# Patient Record
Sex: Male | Born: 1965 | Race: Black or African American | Hispanic: No | Marital: Single | Smoking: Former smoker
Health system: Southern US, Community
[De-identification: ages and names within clinical notes are randomized; demographics above are authoritative.]

## PROBLEM LIST (undated history)

## (undated) DIAGNOSIS — N183 Chronic kidney disease, stage 3 unspecified: Secondary | ICD-10-CM

## (undated) DIAGNOSIS — K922 Gastrointestinal hemorrhage, unspecified: Secondary | ICD-10-CM

## (undated) DIAGNOSIS — I639 Cerebral infarction, unspecified: Secondary | ICD-10-CM

## (undated) DIAGNOSIS — R778 Other specified abnormalities of plasma proteins: Secondary | ICD-10-CM

## (undated) DIAGNOSIS — E785 Hyperlipidemia, unspecified: Secondary | ICD-10-CM

## (undated) DIAGNOSIS — I480 Paroxysmal atrial fibrillation: Secondary | ICD-10-CM

## (undated) DIAGNOSIS — R7989 Other specified abnormal findings of blood chemistry: Secondary | ICD-10-CM

## (undated) DIAGNOSIS — K859 Acute pancreatitis without necrosis or infection, unspecified: Secondary | ICD-10-CM

## (undated) DIAGNOSIS — I5042 Chronic combined systolic (congestive) and diastolic (congestive) heart failure: Secondary | ICD-10-CM

## (undated) DIAGNOSIS — I10 Essential (primary) hypertension: Secondary | ICD-10-CM

## (undated) DIAGNOSIS — I4892 Unspecified atrial flutter: Secondary | ICD-10-CM

## (undated) DIAGNOSIS — I426 Alcoholic cardiomyopathy: Secondary | ICD-10-CM

## (undated) DIAGNOSIS — F1010 Alcohol abuse, uncomplicated: Secondary | ICD-10-CM

## (undated) DIAGNOSIS — G4730 Sleep apnea, unspecified: Secondary | ICD-10-CM

## (undated) HISTORY — DX: Essential (primary) hypertension: I10

## (undated) HISTORY — DX: Other specified abnormalities of plasma proteins: R77.8

## (undated) HISTORY — DX: Chronic combined systolic (congestive) and diastolic (congestive) heart failure: I50.42

## (undated) HISTORY — DX: Acute pancreatitis without necrosis or infection, unspecified: K85.90

## (undated) HISTORY — DX: Other specified abnormal findings of blood chemistry: R79.89

## (undated) HISTORY — DX: Alcoholic cardiomyopathy: I42.6

## (undated) HISTORY — DX: Alcohol abuse, uncomplicated: F10.10

## (undated) HISTORY — DX: Hyperlipidemia, unspecified: E78.5

## (undated) HISTORY — DX: Paroxysmal atrial fibrillation: I48.0

## (undated) HISTORY — PX: KNEE SURGERY: SHX244

## (undated) HISTORY — DX: Unspecified atrial flutter: I48.92

## (undated) HISTORY — DX: Sleep apnea, unspecified: G47.30

## (undated) HISTORY — DX: Gastrointestinal hemorrhage, unspecified: K92.2

## (undated) SURGERY — TRANSESOPHAGEAL ECHOCARDIOGRAM (TEE)
Anesthesia: General

---

## 2004-10-11 ENCOUNTER — Emergency Department: Payer: Self-pay | Admitting: Internal Medicine

## 2005-12-09 ENCOUNTER — Emergency Department: Payer: Self-pay | Admitting: Emergency Medicine
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
## 2005-12-22 ENCOUNTER — Other Ambulatory Visit: Payer: Self-pay

## 2007-02-24 ENCOUNTER — Other Ambulatory Visit: Payer: Self-pay

## 2007-02-24 ENCOUNTER — Observation Stay: Payer: Self-pay | Admitting: Internal Medicine

## 2007-02-24 DIAGNOSIS — I426 Alcoholic cardiomyopathy: Secondary | ICD-10-CM

## 2007-02-24 HISTORY — DX: Alcoholic cardiomyopathy: I42.6

## 2007-02-24 IMAGING — CR DG CHEST 1V PORT
1 series · 1 of 1 positions shown · non-contrast
Comparison: none

REASON FOR EXAM: CP
COMMENTS:

[view not recorded]
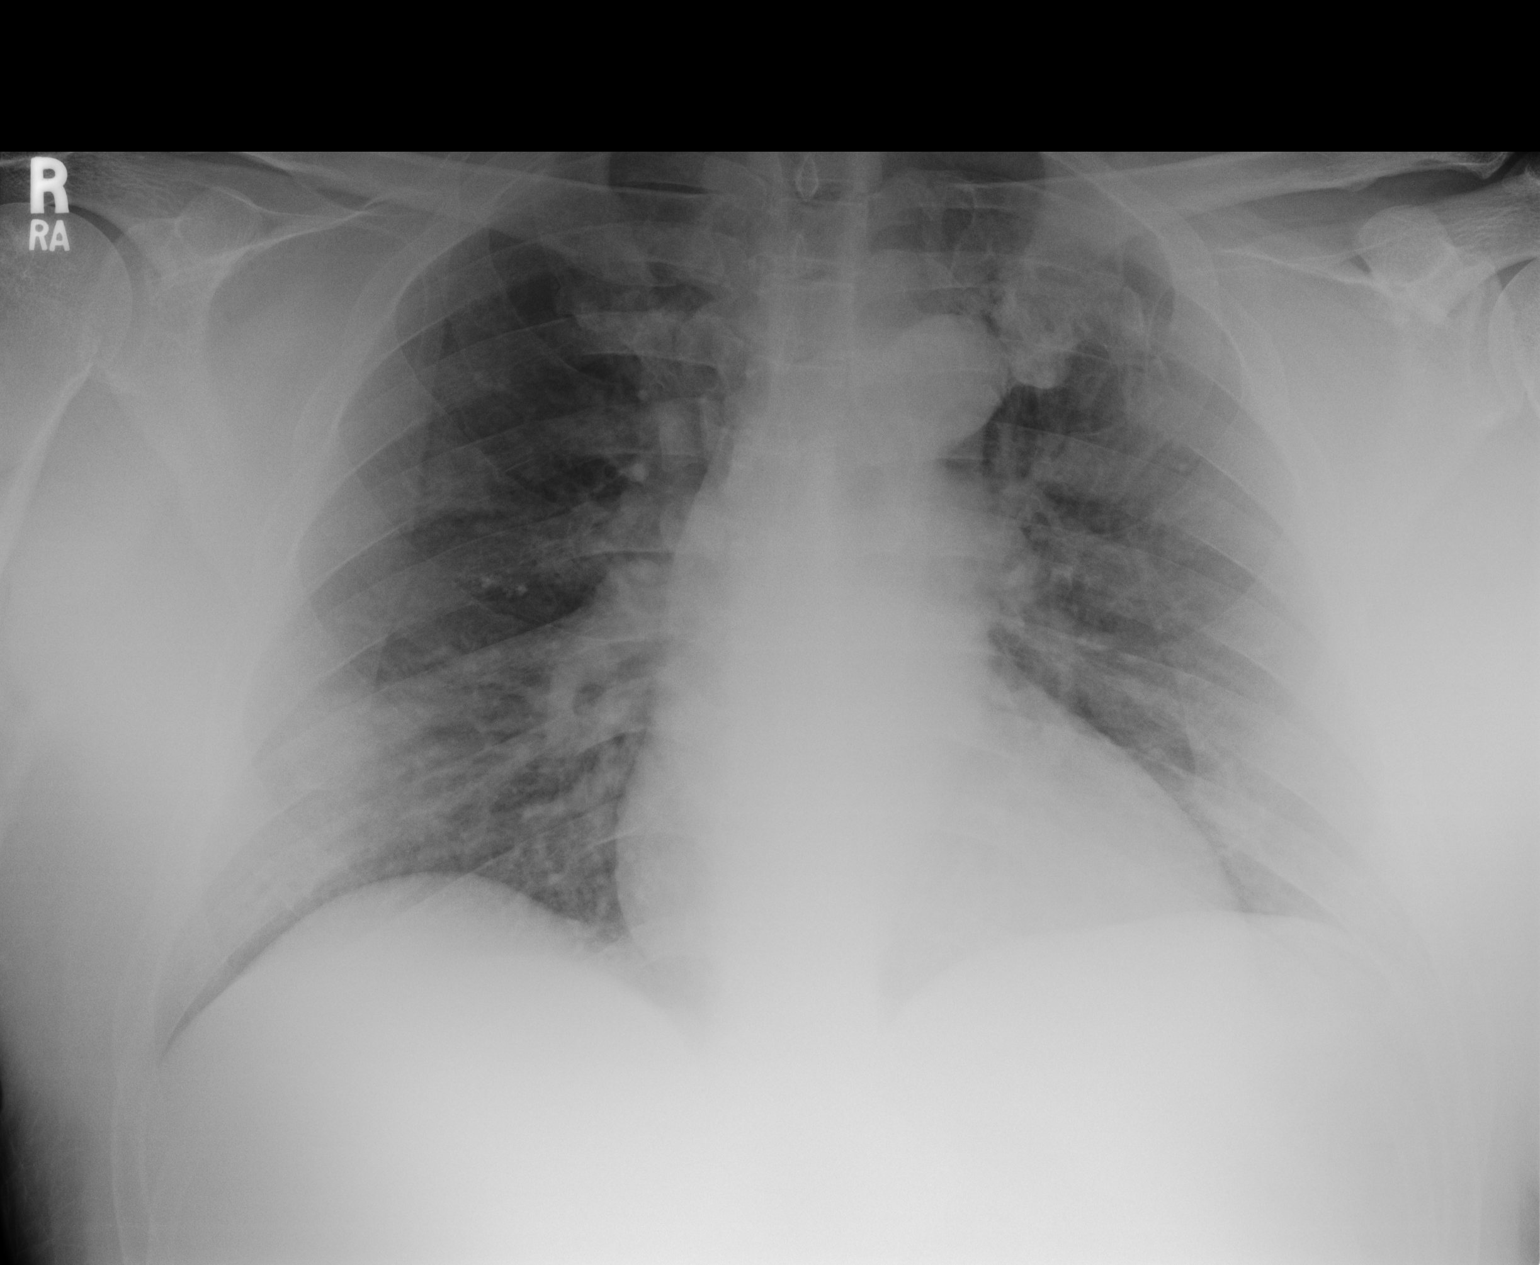

[1 of 1 positions shown; findings below may reference images not displayed]

PROCEDURE:     DXR - DXR PORTABLE CHEST SINGLE VIEW  - [DATE]  [DATE]

RESULT:     There is mild thickening of the interstitial markings. An
ill-defined area of increased density projects within the right lower lobe.
 A second area of nodular increased density projects in the region of the
left upper lobe. Cardiac silhouette is moderately enlarged. The patient has
taken a shallow inspiration.  There is mild thickening of interstitial
markings.  Visualized bony skeleton demonstrates no evidence of of fracture
or dislocation.
IMPRESSION: Atelectasis versus infiltrate in the right lung base.
Atelectasis versus infiltrate versus possibly a nodular density within the
left upper lobe. Repeat surveillance evaluation is recommended as well as
repeat evaluation with PA and lateral view of the chest.

## 2007-02-25 IMAGING — US US RENAL KIDNEY
2 series · 25 of 25 positions shown · non-contrast
Comparison: none

REASON FOR EXAM: r/o hydronephrosis
COMMENTS:

[Series 1: us renal kidney · 13 of 24 slices shown (1 of 2)]
[im 1/24]
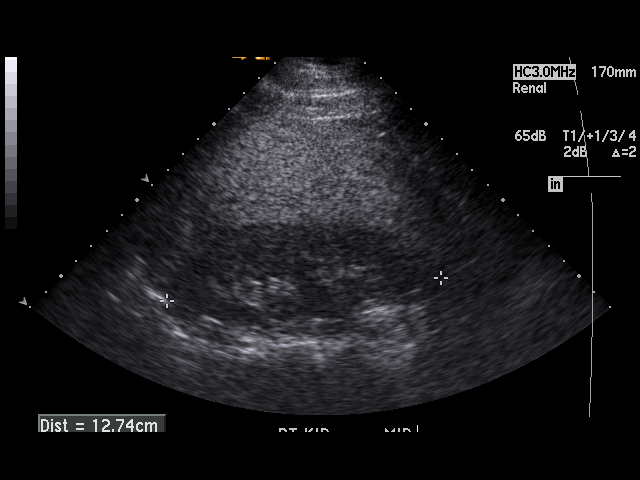
[im 2/24]
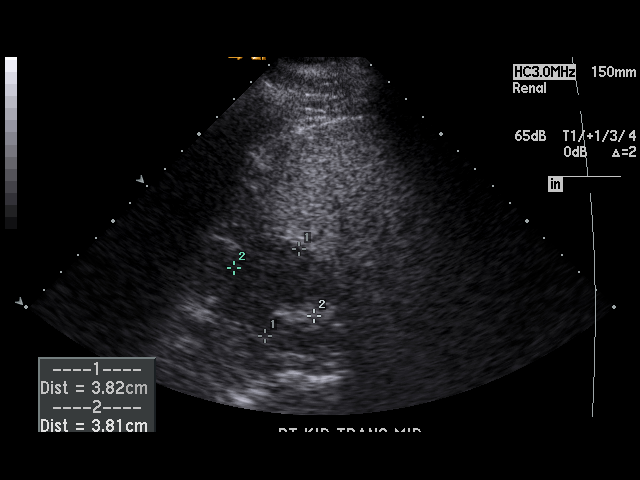
[im 4/24]
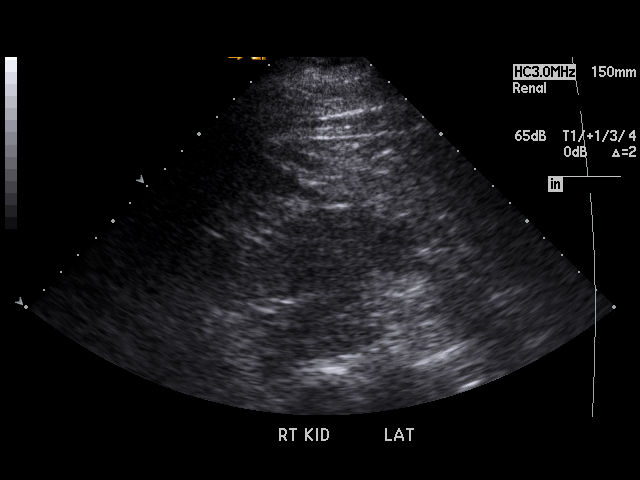
[im 6/24]
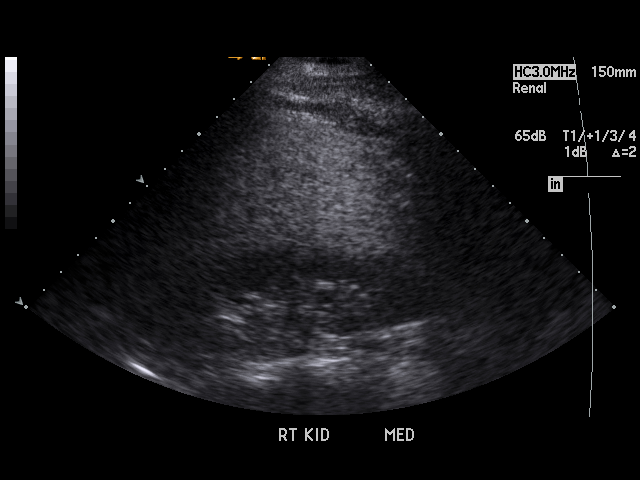
[im 8/24]
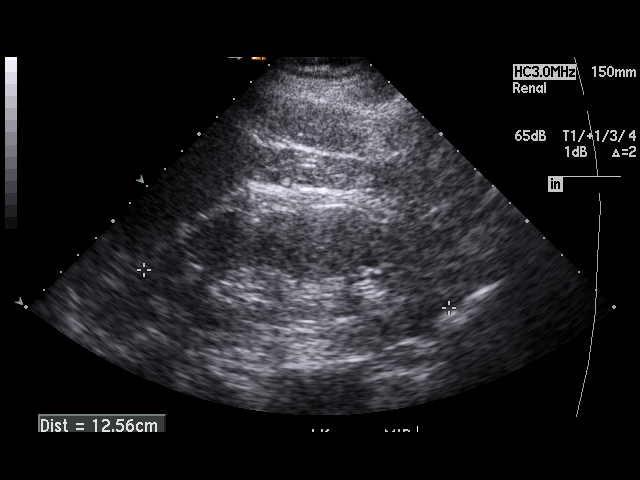
[im 10/24]
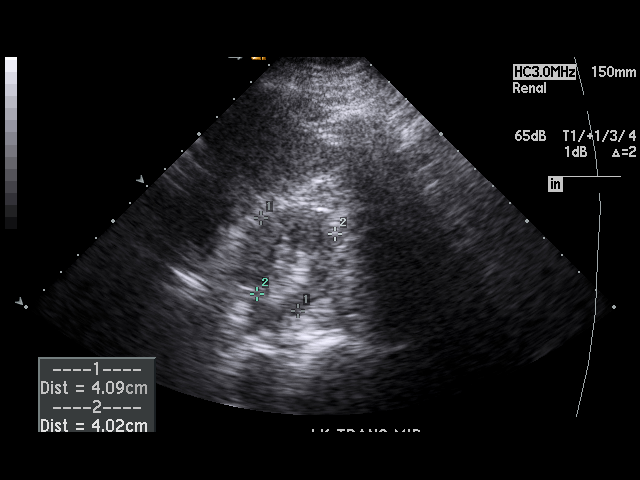
[im 12/24]
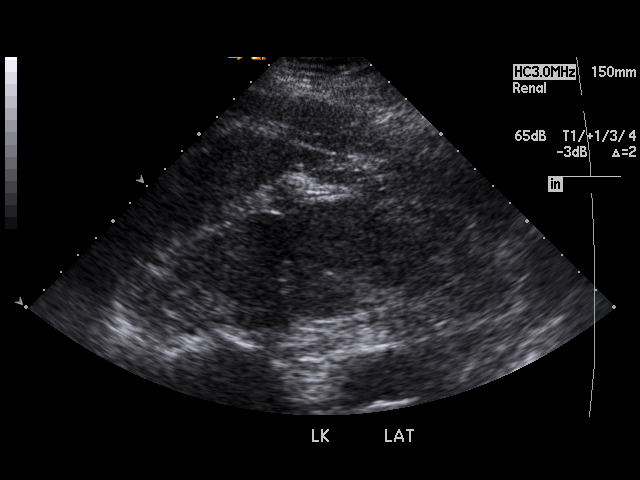
[im 14/24]
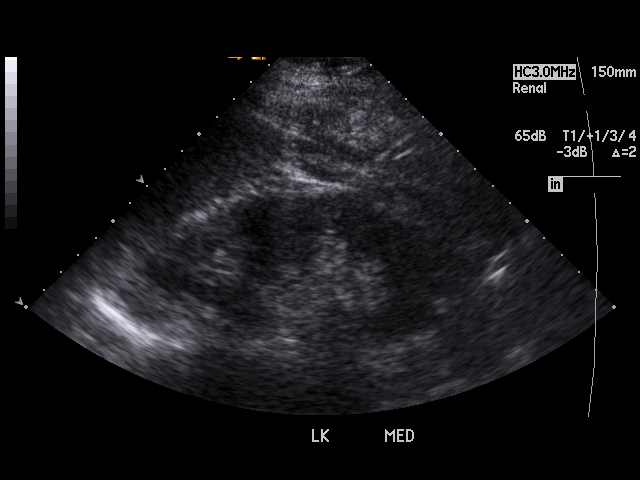
[im 16/24]
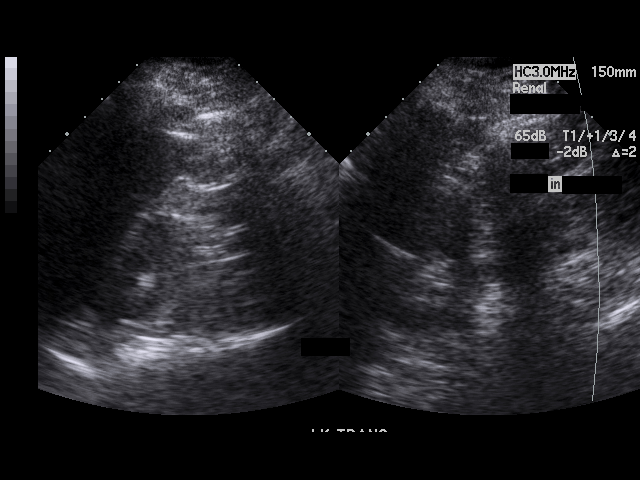
[im 18/24]
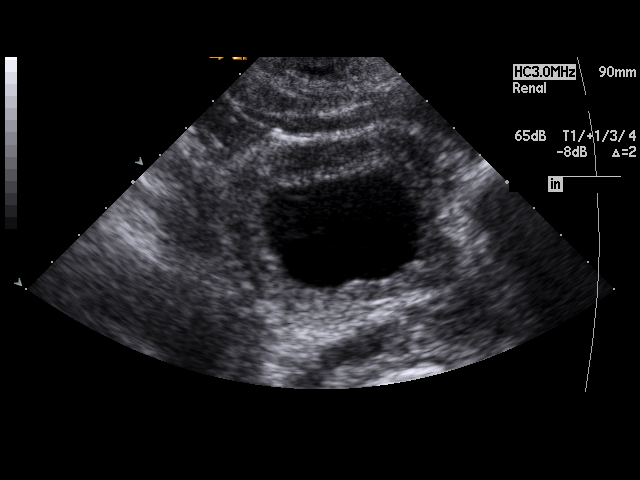
[im 20/24]
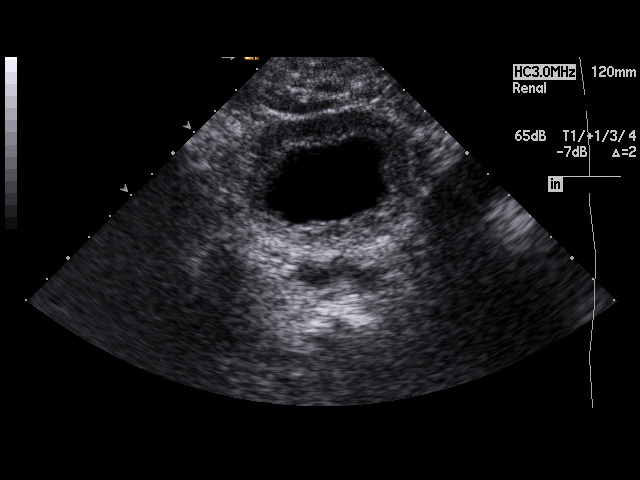
[im 22/24]
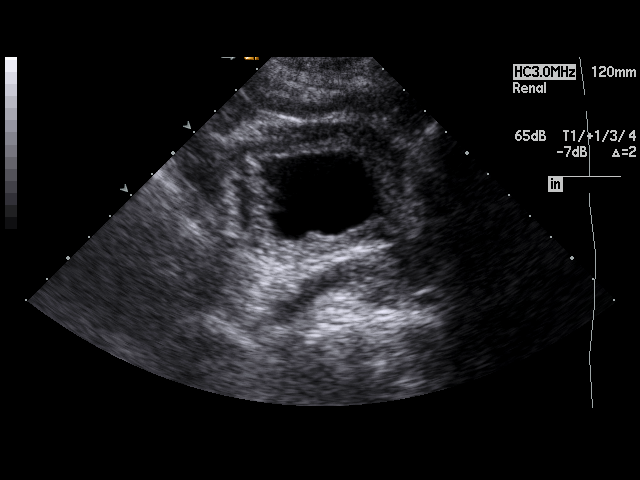
[im 24/24]
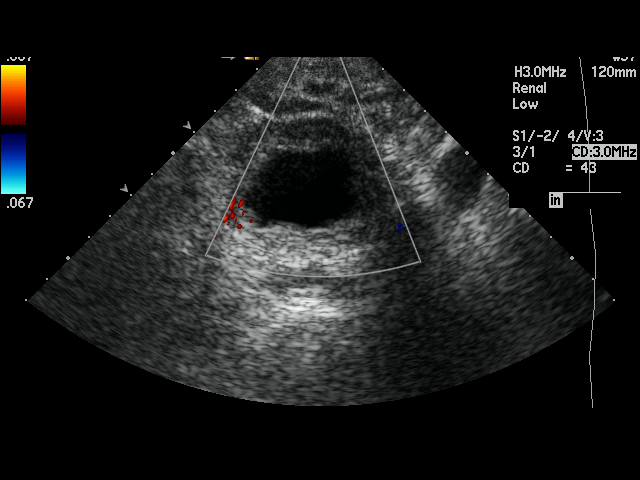

[Series 2: us renal kidney · 12 of 24 slices shown (2 of 2)]
[im 1/24]
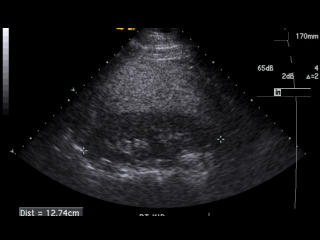
[im 3/24]
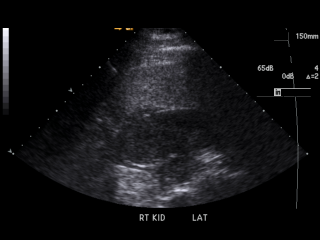
[im 5/24]
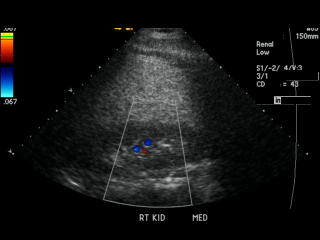
[im 7/24]
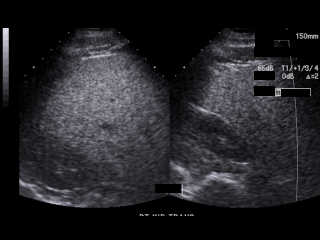
[im 9/24]
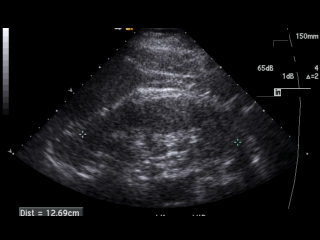
[im 11/24]
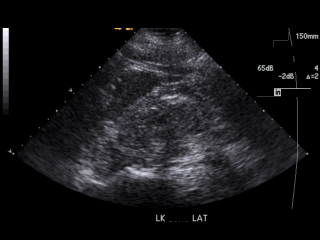
[im 13/24]
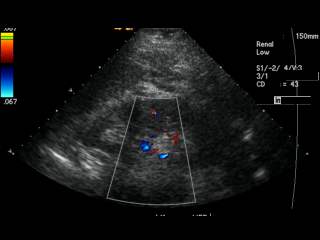
[im 15/24]
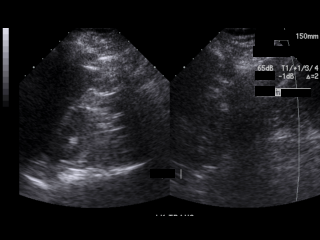
[im 17/24]
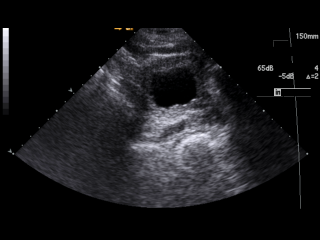
[im 19/24]
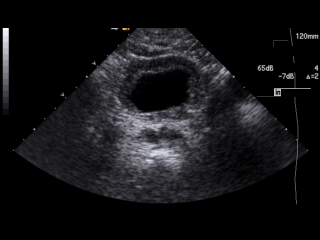
[im 21/24]
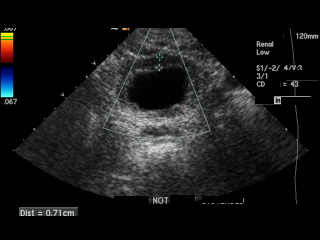
[im 24/24]
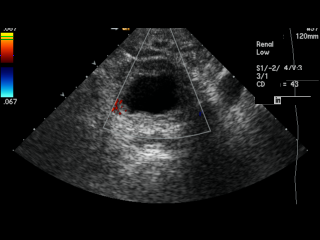

[25 of 25 positions shown; findings below may reference images not displayed]

PROCEDURE:     US  - US KIDNEY BILATERAL  - [DATE] [DATE]

RESULT:     The RIGHT kidney measures 12.74 cm x 3.82 cm x 3.81 cm and the
LEFT kidney measures 12.69 cm x 4.09 cm x 4.02 cm. The renal cortical
margins are smooth. No new renal mass lesions are seen. No renal
calcifications are noted. There is no hydronephrosis. The urinary bladder is
not fully distended. The bladder wall appears thickened but this could be
due to the lack of filling. The possibility, however, of bladder wall
thickening cannot be totally excluded on this exam.
IMPRESSION: 1. No hydronephrosis or other acute renal abnormalities identified.
2. Possible thickening of the gallbladder wall. As noted above, this
appearance may be secondary to the bladder being only partially filled.

## 2007-12-24 ENCOUNTER — Emergency Department: Payer: Self-pay | Admitting: Emergency Medicine

## 2007-12-24 IMAGING — CR DG CHEST 2V
1 series · 2 of 2 positions shown · non-contrast
Comparison: none

REASON FOR EXAM: Chest pain
COMMENTS:

PROCEDURE:     DXR - DXR CHEST PA (OR AP) AND LATERAL  - [DATE]  [DATE]
RESULT:     The mediastinal and hilar structures are normal. The lungs are
clear of infiltrates. There is cardiomegaly without evidence of overt
congestive heart failure.

[Series 1: view not recorded · 0.17mm/px · 2 of 2 slices shown]
[im 1/2]
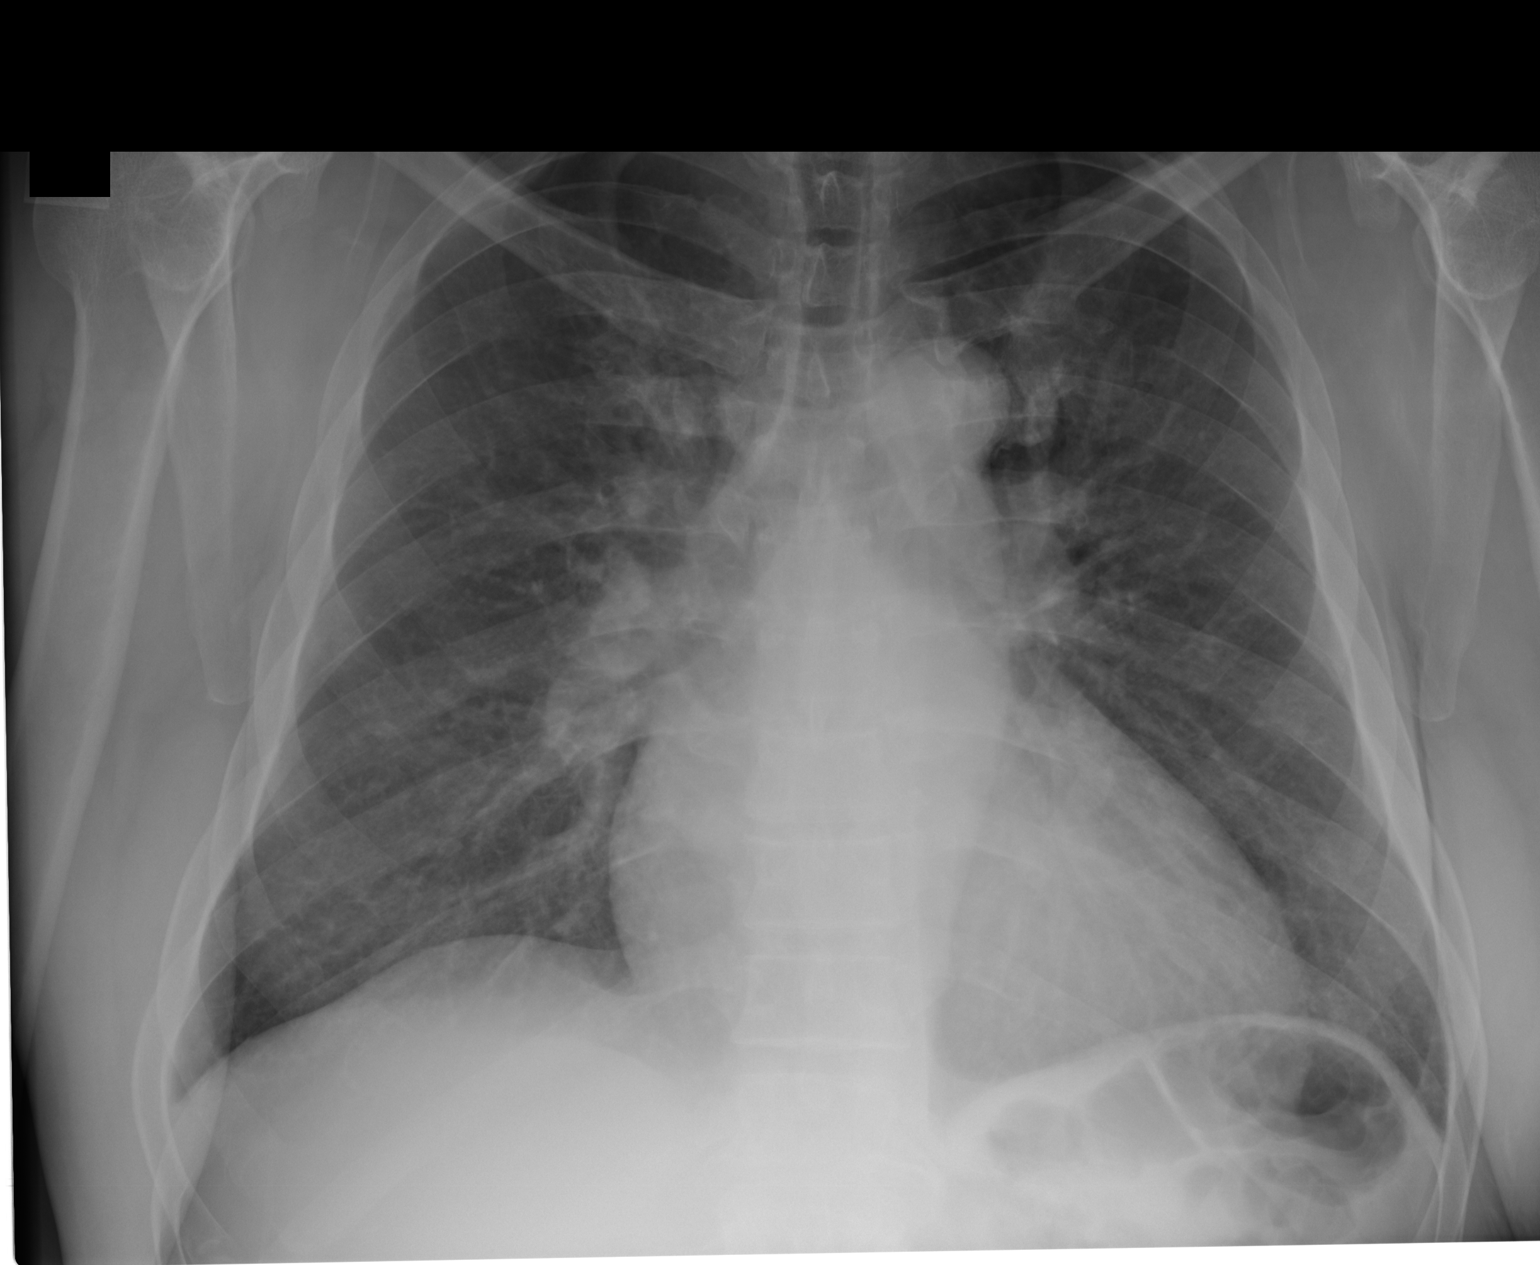
[im 2/2]
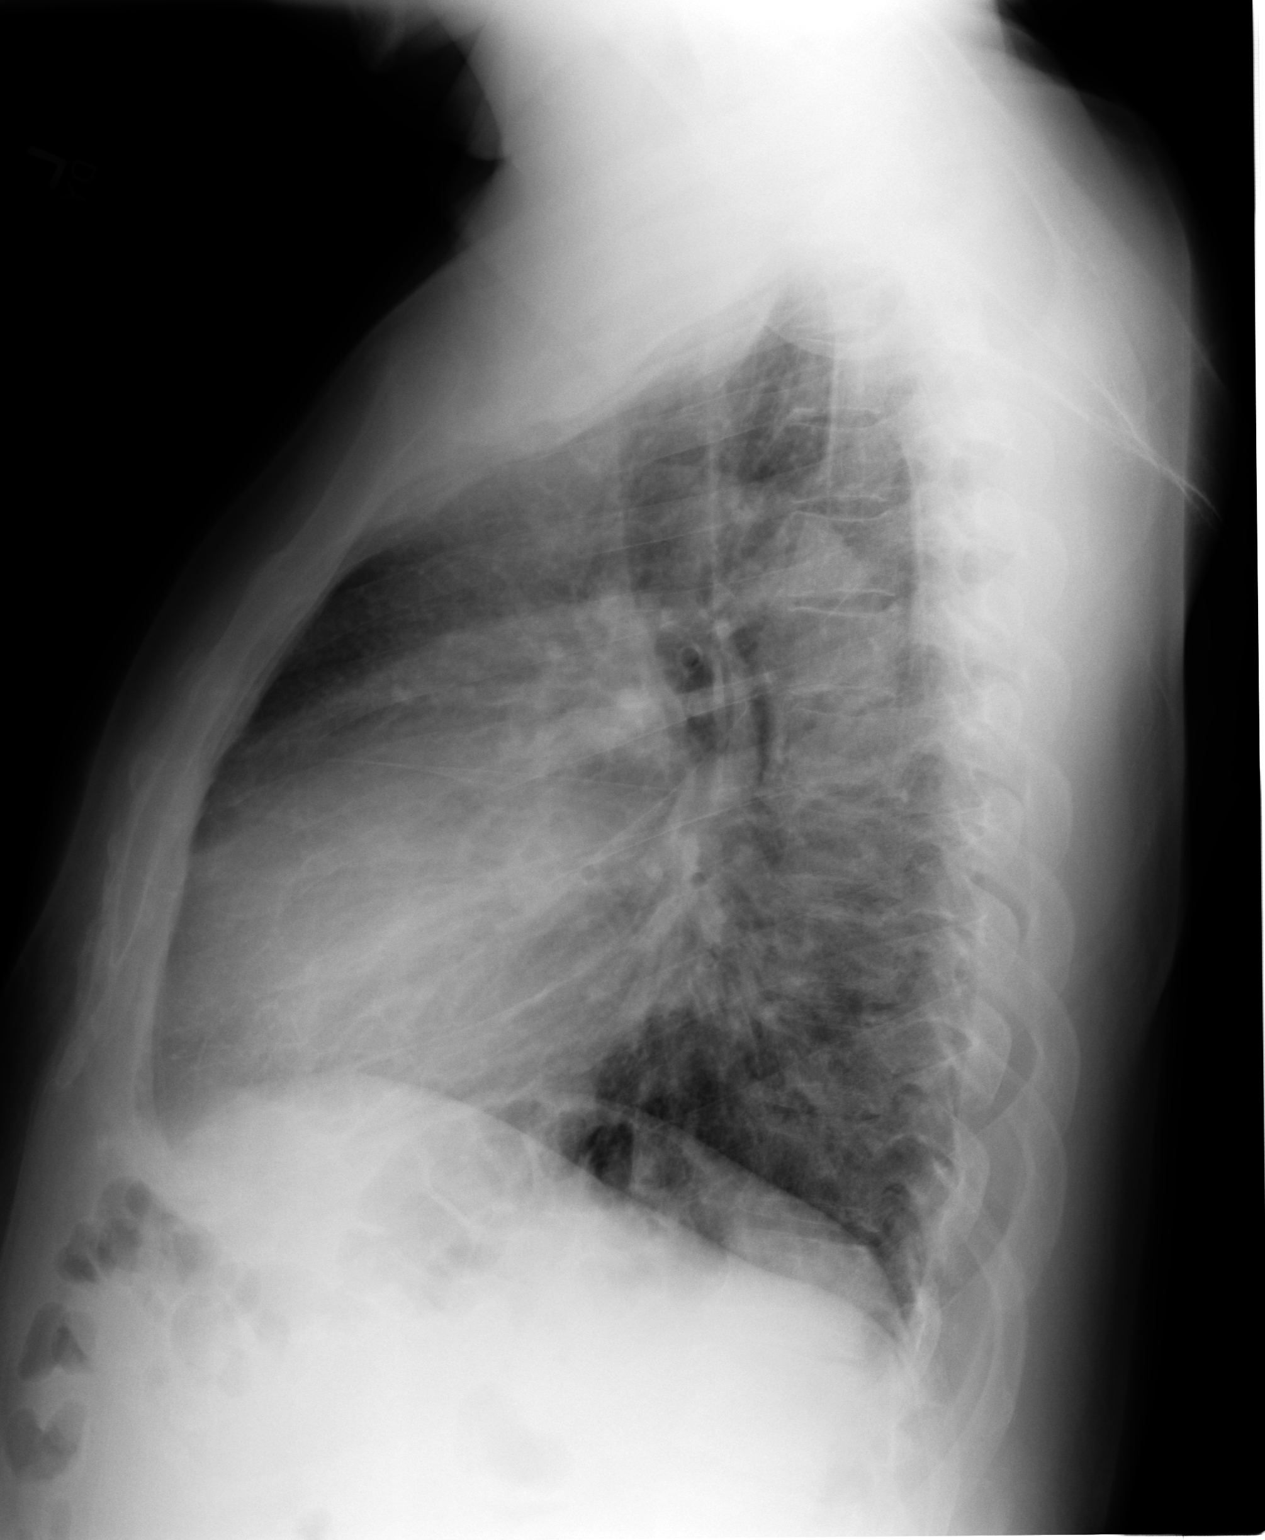

[2 of 2 positions shown; findings below may reference images not displayed]

IMPRESSION: Cardiomegaly. No congestive heart failure.

## 2008-01-09 ENCOUNTER — Inpatient Hospital Stay: Payer: Self-pay | Admitting: Internal Medicine

## 2008-01-09 IMAGING — US ABDOMEN ULTRASOUND
1 series · 17 of 25 positions shown · non-contrast
Comparison: none

REASON FOR EXAM: jaundice, leg swelling
COMMENTS:

[Series 1: abdomen ultrasound · 17 of 73 slices shown]
[im 1/73]
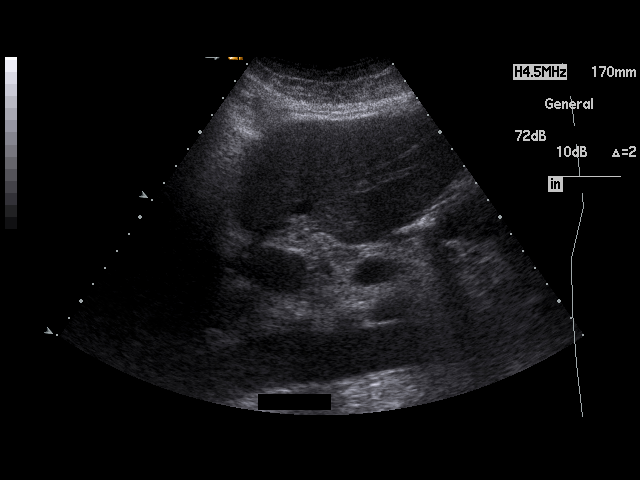
[im 7/73]
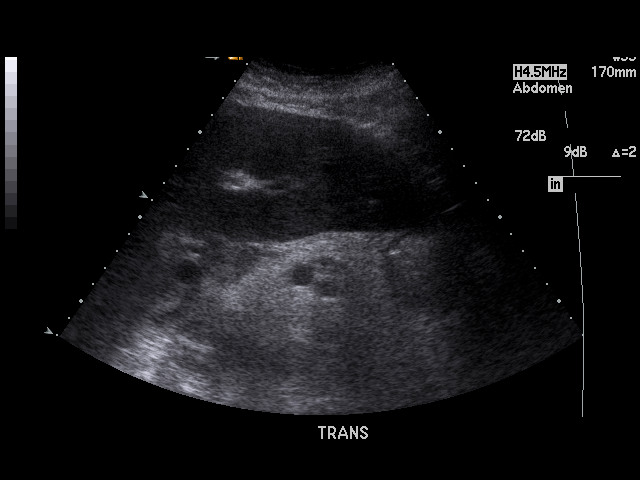
[im 10/73]
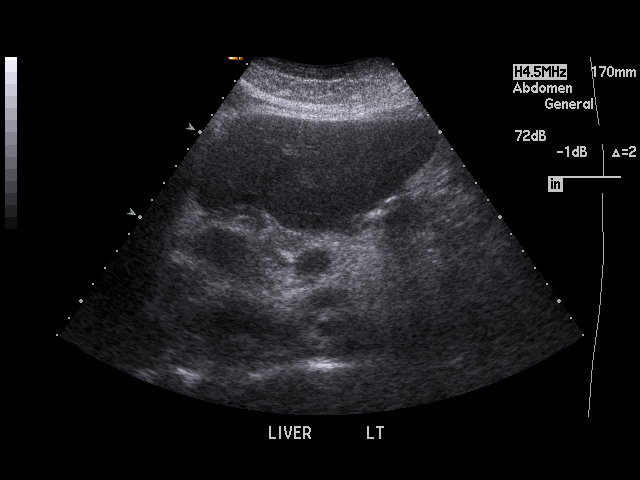
[im 16/73]
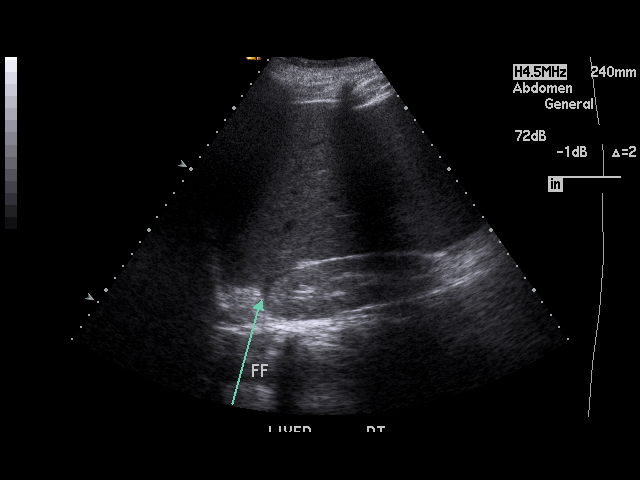
[im 19/73]
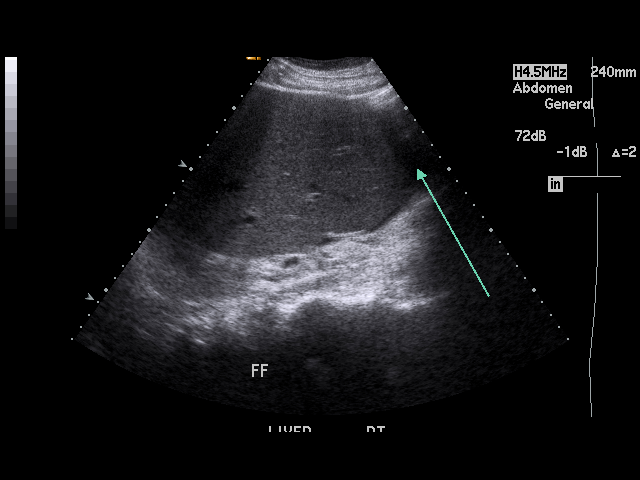
[im 25/73]
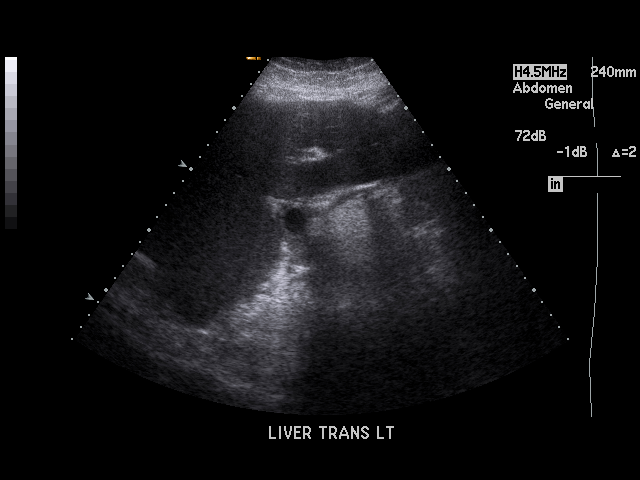
[im 28/73]
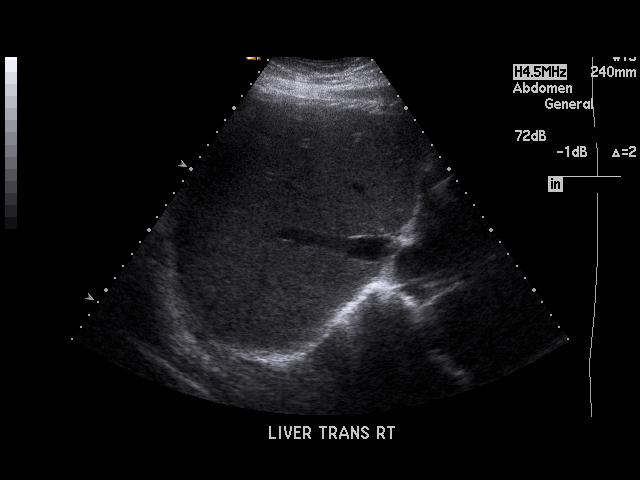
[im 34/73]
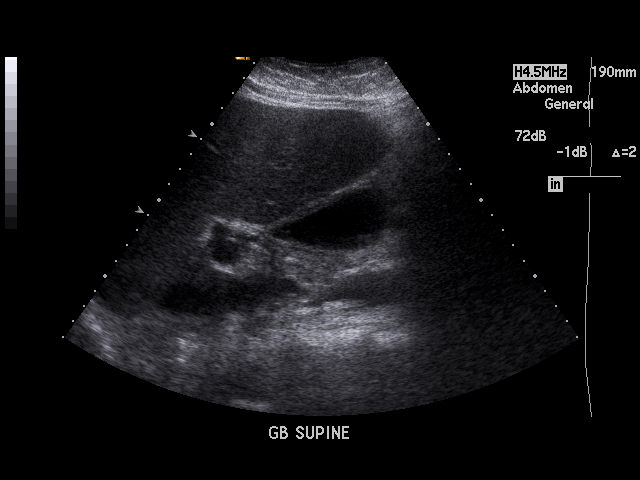
[im 37/73]
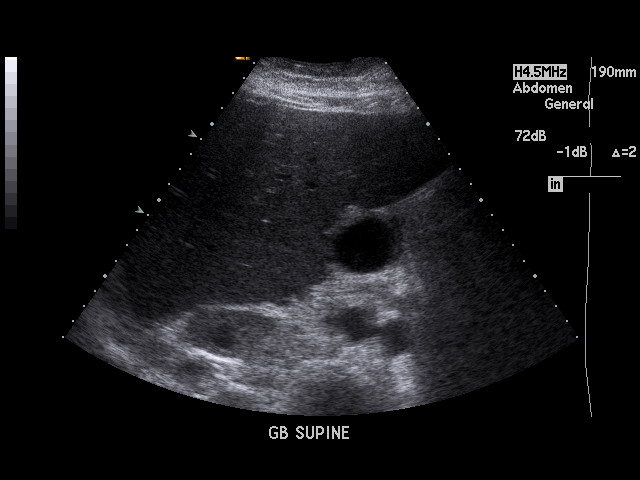
[im 40/73]
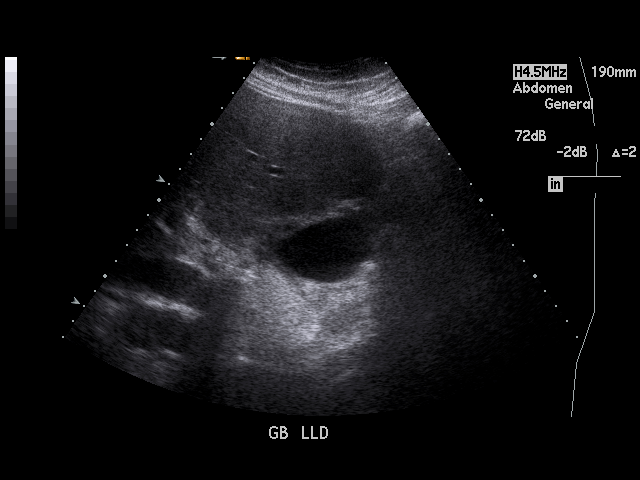
[im 46/73]
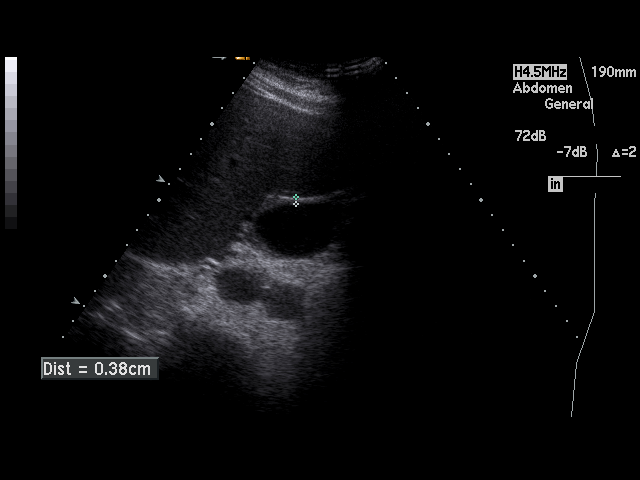
[im 49/73]
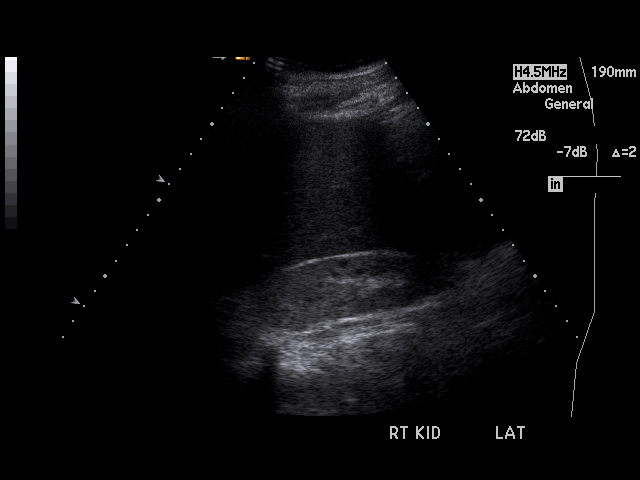
[im 55/73]
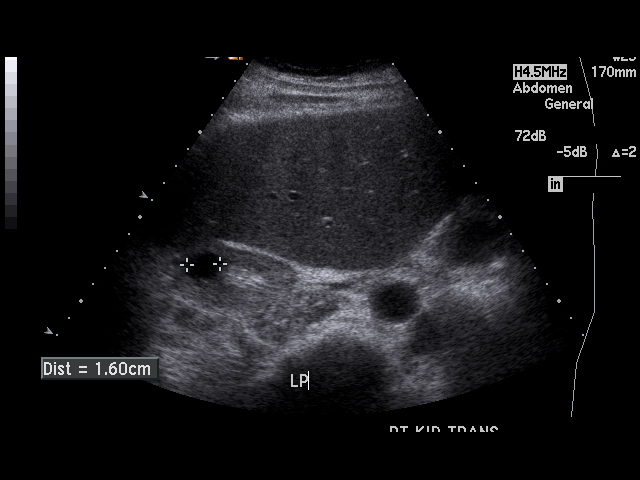
[im 58/73]
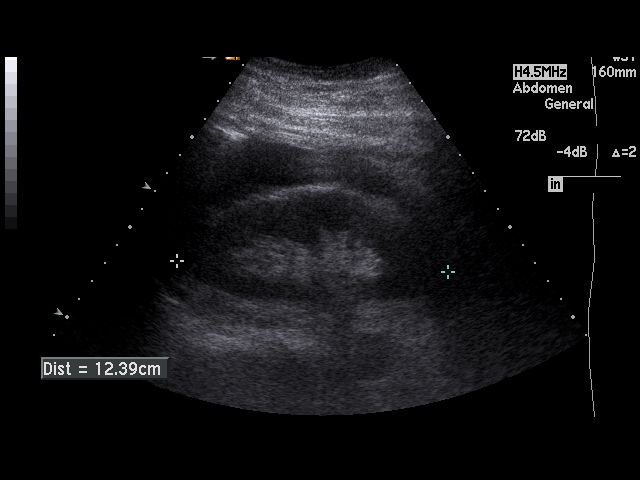
[im 64/73]
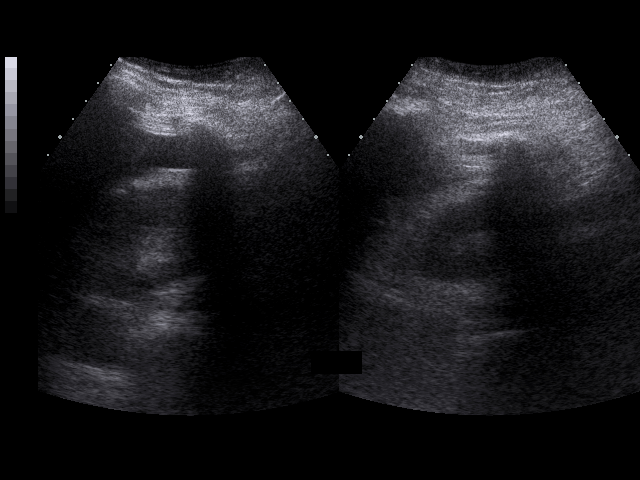
[im 67/73]
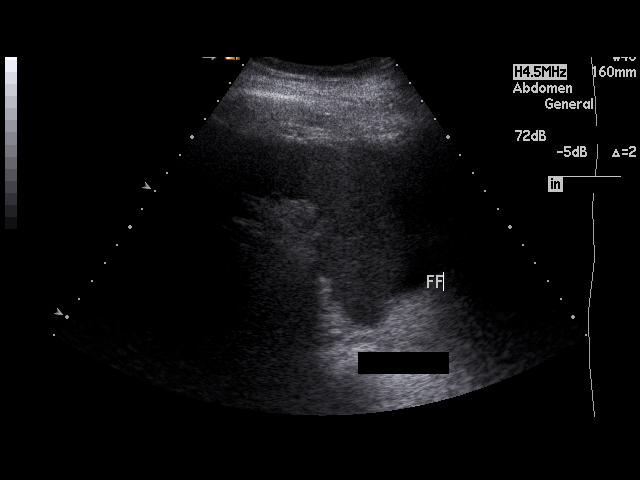
[im 73/73]
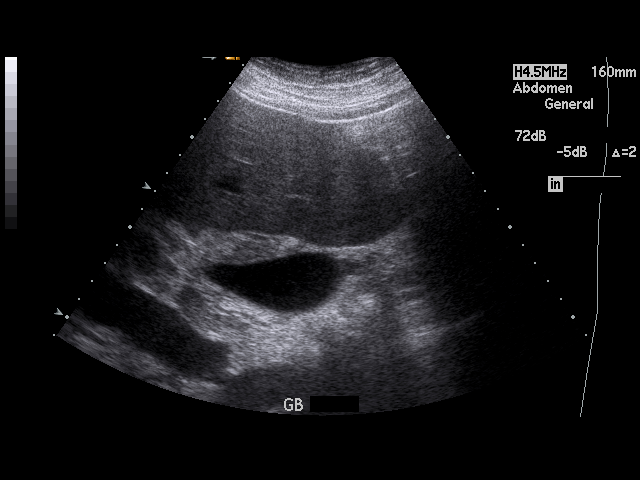

[17 of 25 positions shown; findings below may reference images not displayed]

PROCEDURE:     US  - US ABDOMEN GENERAL SURVEY  - [DATE]  [DATE]

RESULT:     The liver is enlarged and slightly echogenic.  Hepatocellular
disease cannot be excluded. There is trace ascites. There is mild
gallbladder wall thickening at 3.8 mm.  Sludge is noted within the
gallbladder. There are no stones. There is no hydronephrosis. Increased
echogenicity is noted in the kidneys. Renal disease could present in this
fashion. The RIGHT kidney measures 12.1 cm and the LEFT 12.4.  Cysts are
noted in the RIGHT kidney.  The spleen measures 9.7 cm.
IMPRESSION: 1. Mild enlargement and increased echogenicity of the liver suggesting
hepatocellular disease. There is mild ascites. There is mild gallbladder
wall thickening.

2. Sludge, no gallstones, no gallbladder distention.

3. Echogenic kidneys suggesting chronic renal disease.

## 2008-01-09 IMAGING — CR DG CHEST 2V
1 series · 2 of 2 positions shown · non-contrast
Comparison: none

REASON FOR EXAM: Dyspnea/shortness of breath
COMMENTS:

[Series 1: view not recorded · 0.17mm/px · 2 of 2 slices shown]
[im 1/2]
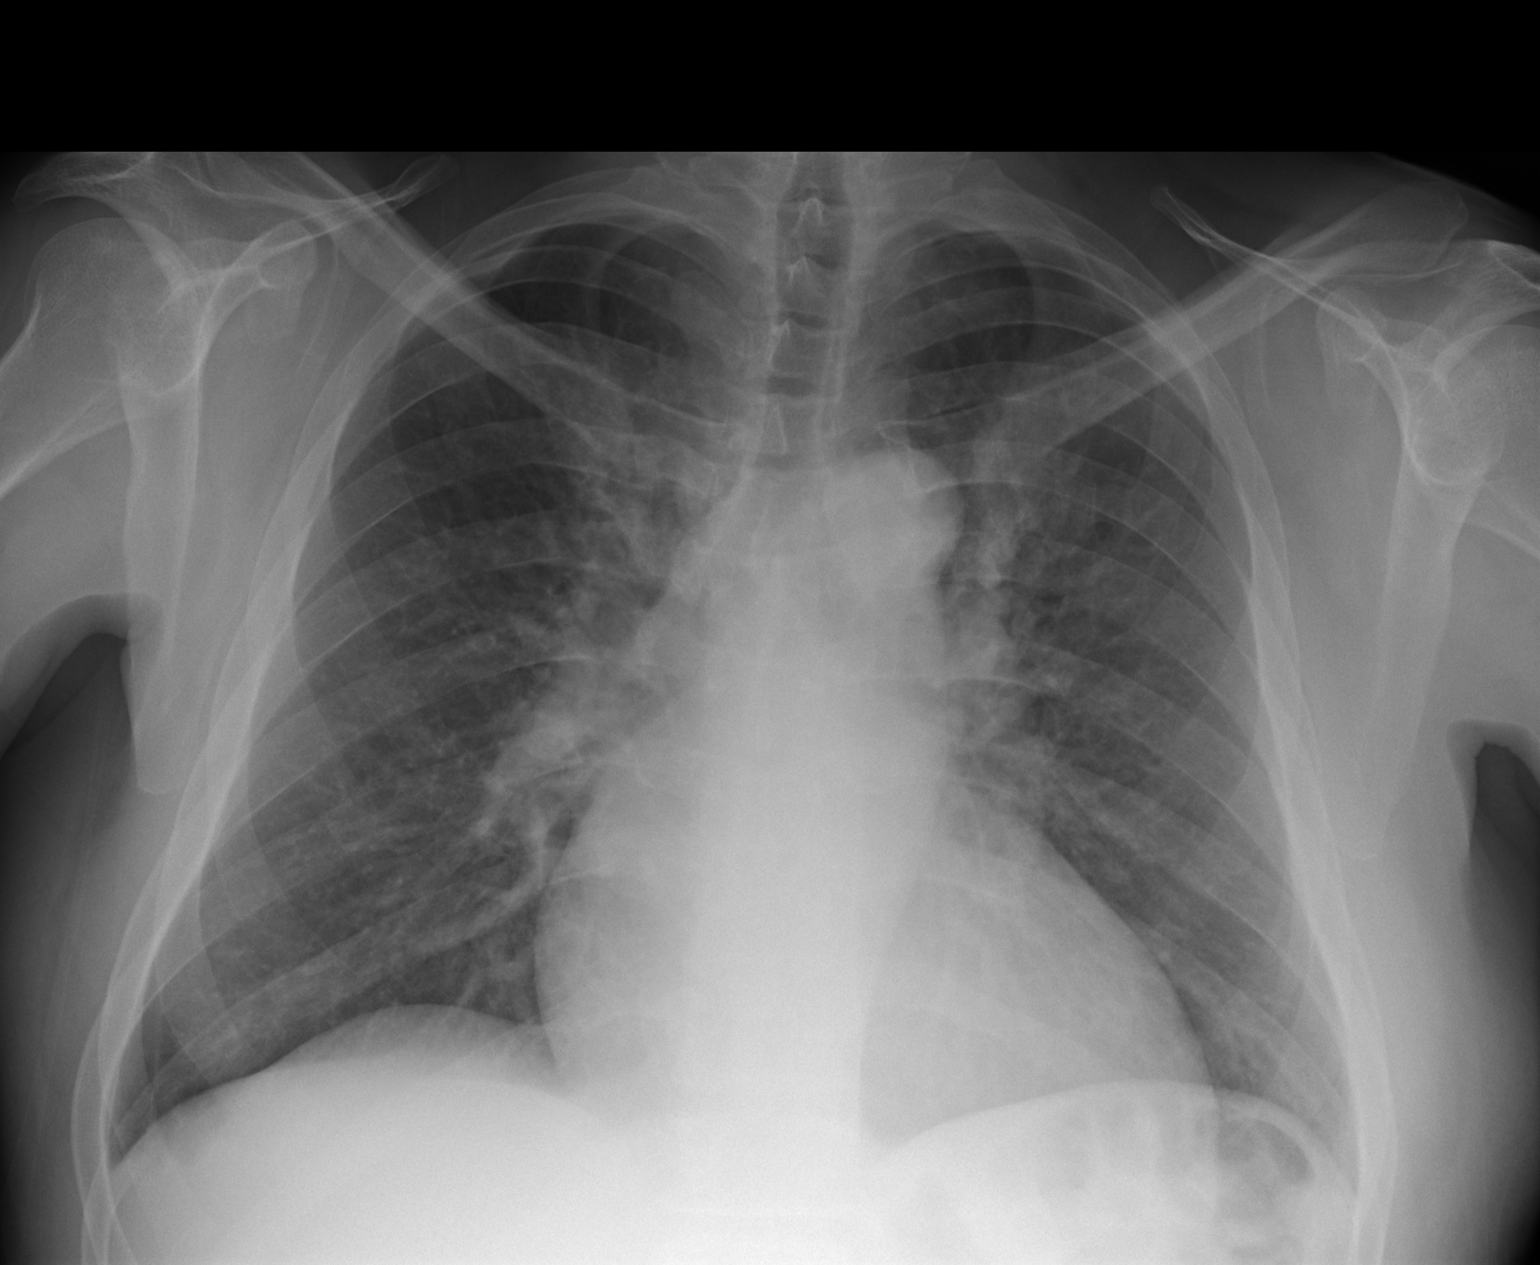
[im 2/2]
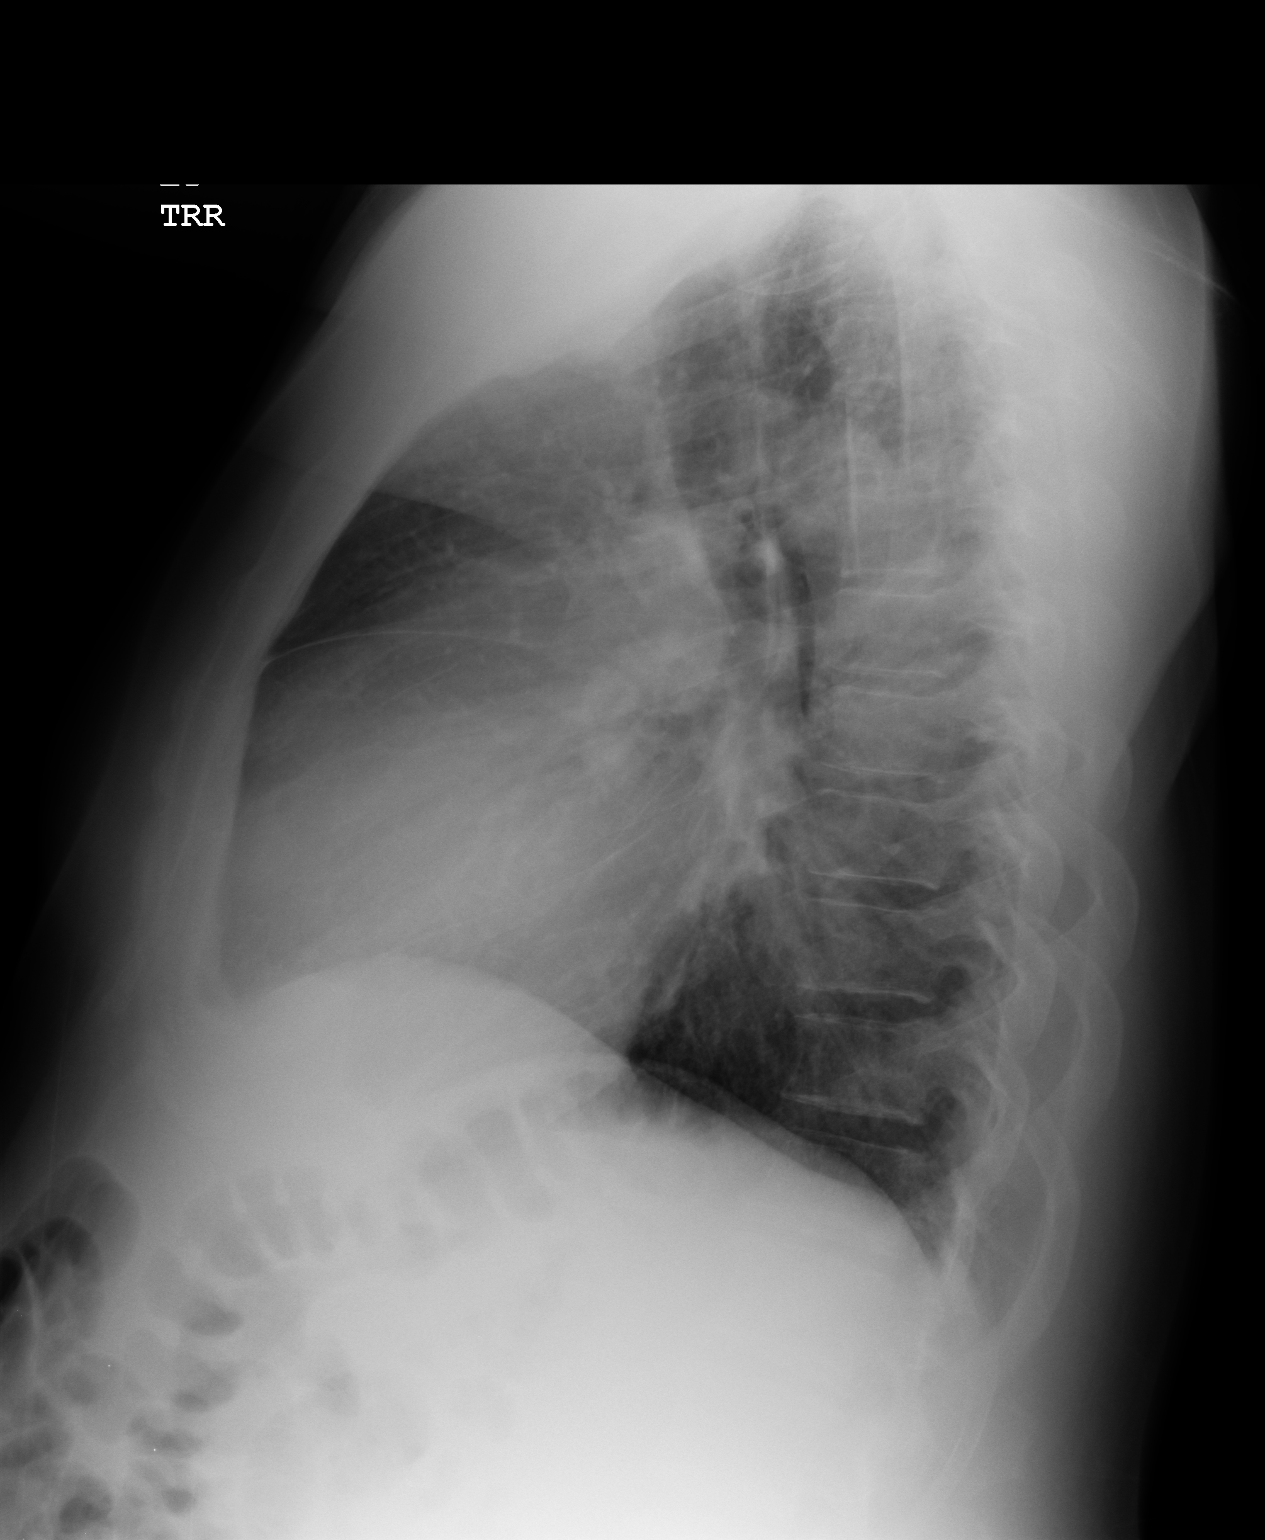

[2 of 2 positions shown; findings below may reference images not displayed]

PROCEDURE:     DXR - DXR CHEST PA (OR AP) AND LATERAL  - [DATE]  [DATE]

RESULT:     Comparison is made to prior exams. Again noted is cardiomegaly
with pulmonary vascular prominence. To exclude developing congestive heart
failure follow-up chest x-ray is suggested. Increased markings are noted in
the LEFT upper lobe. These are most likely related to overlapping shadows
including bony structures and vascular structures. Again, follow-up chest
x-ray may prove useful.
IMPRESSION: 1. Cardiomegaly with pulmonary vascular congestion. Similar findings are
noted on prior exams and follow-up chest x-rays are suggested to exclude
developing congestive heart failure.

## 2008-01-10 IMAGING — US US EXTREM LOW VENOUS BILAT
1 series · 17 of 24 positions shown · non-contrast
Comparison: none

REASON FOR EXAM: Swelling, pain
COMMENTS:

[Series 1: us extrem low venous bilat · 17 of 41 slices shown]
[im 1/41]
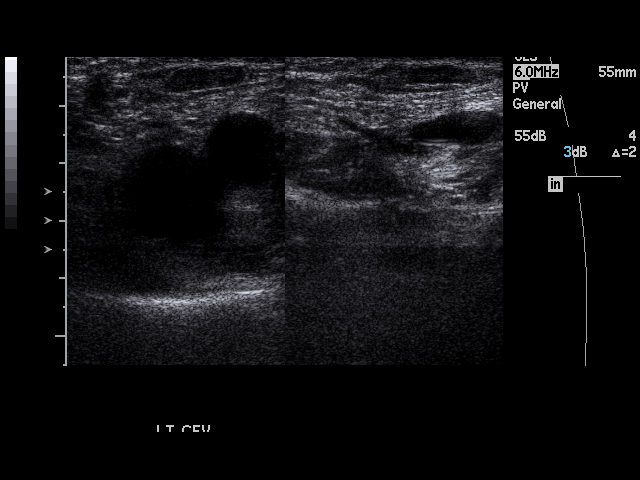
[im 4/41]
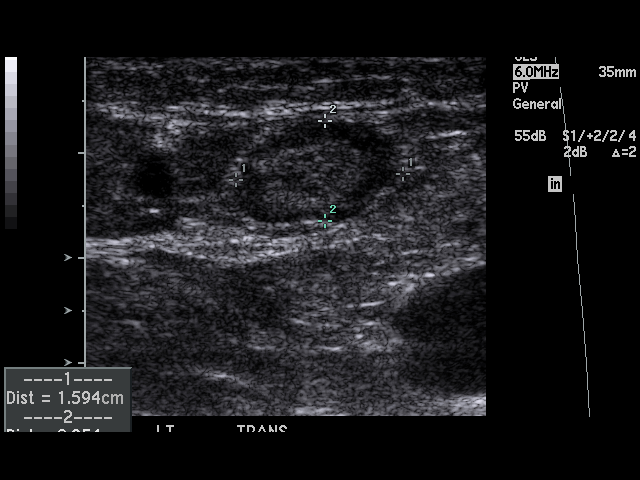
[im 6/41]
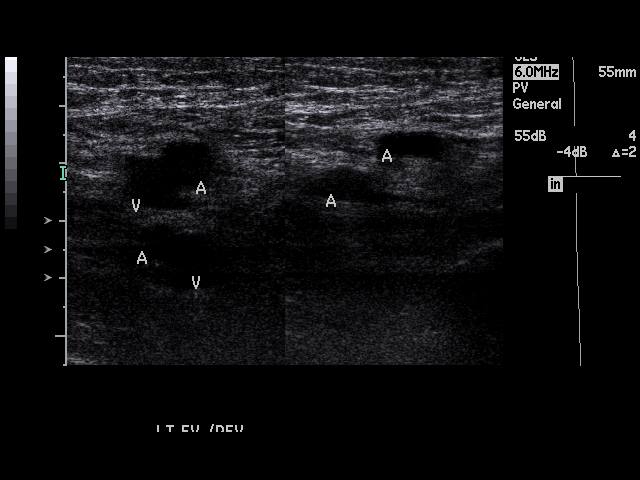
[im 7/41]
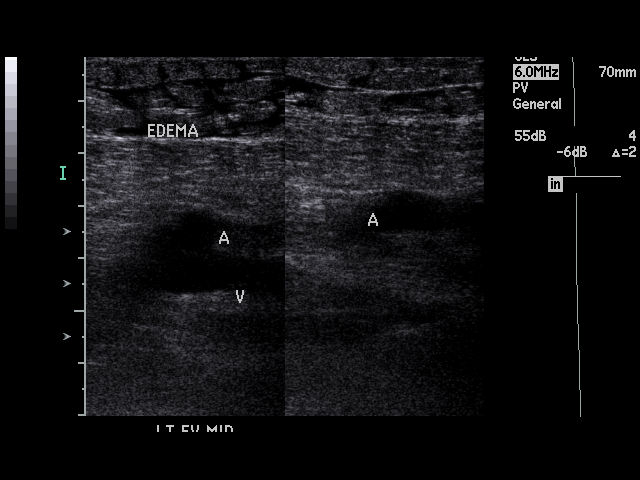
[im 11/41]
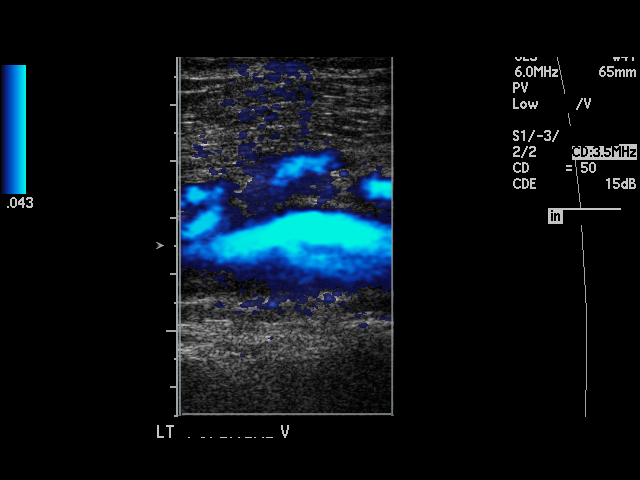
[im 13/41]
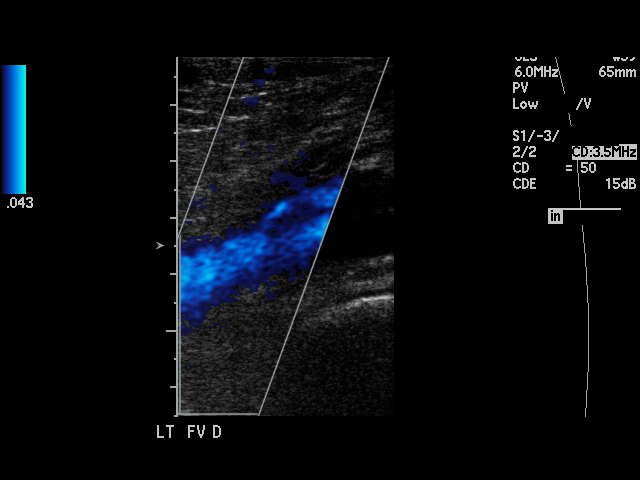
[im 16/41]
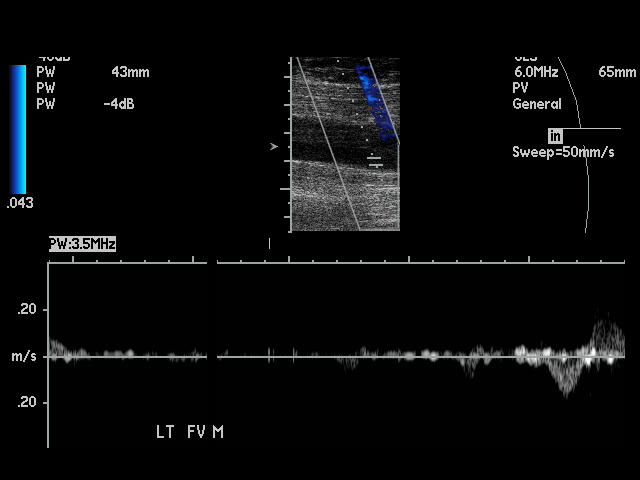
[im 18/41]
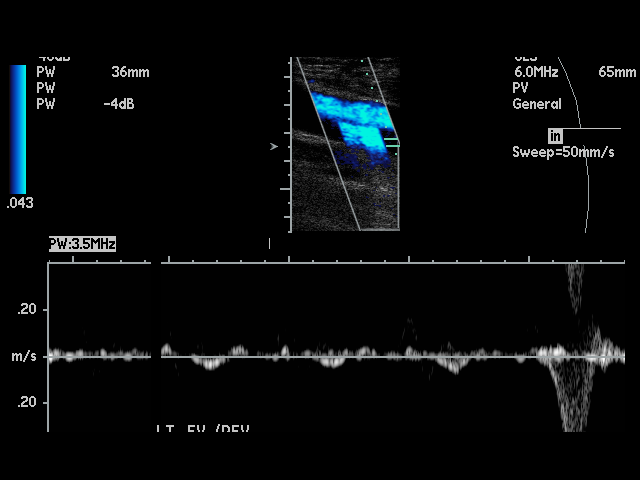
[im 21/41]
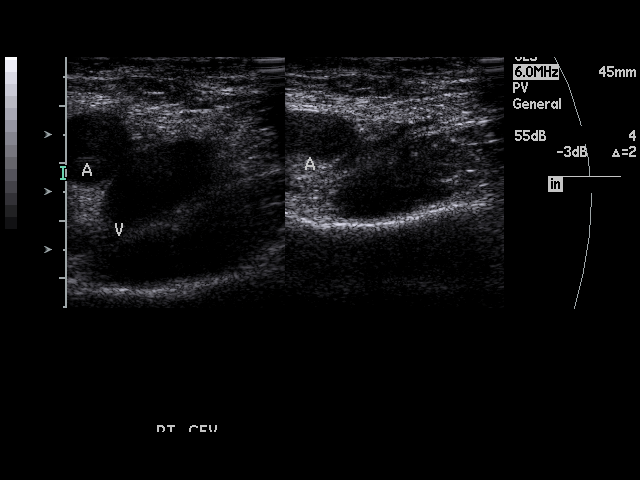
[im 23/41]
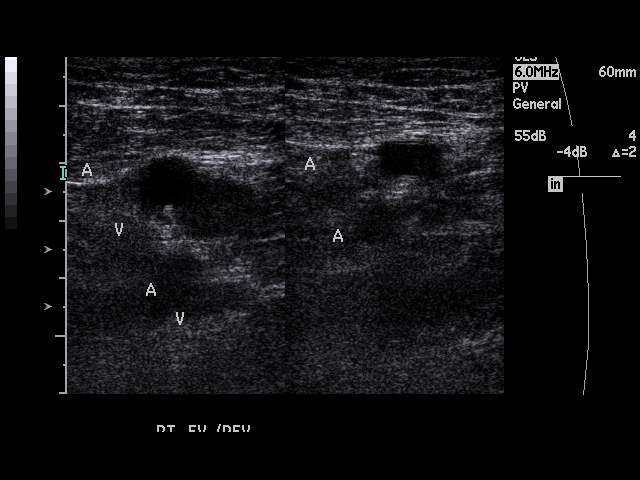
[im 25/41]
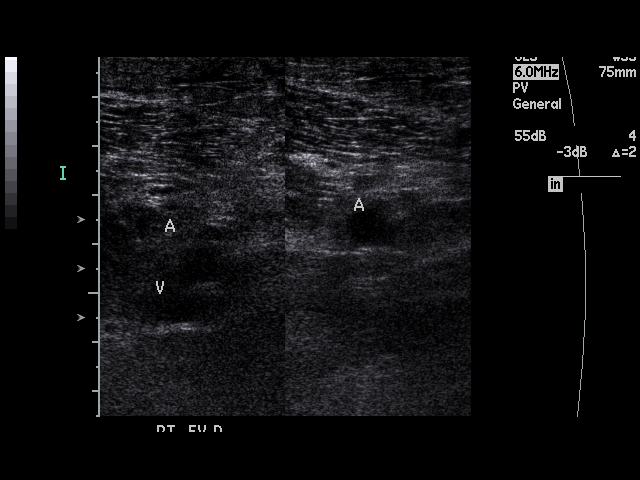
[im 28/41]
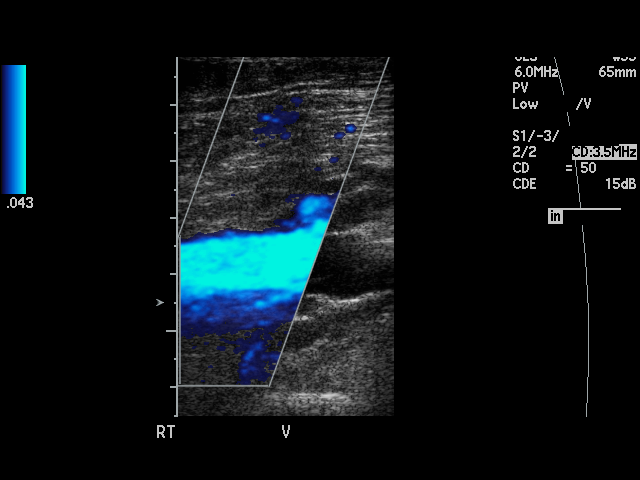
[im 30/41]
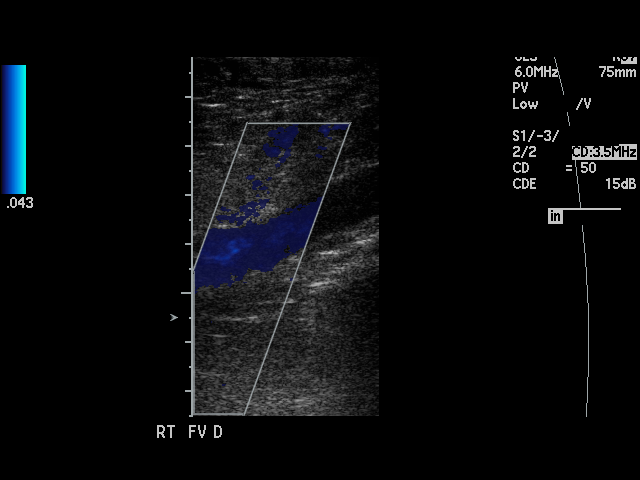
[im 34/41]
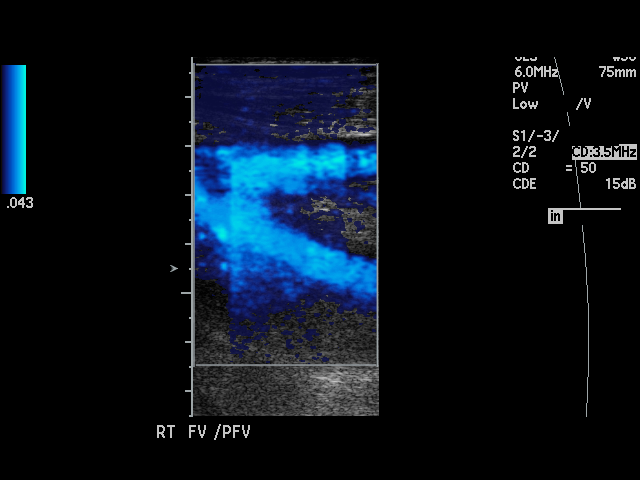
[im 35/41]
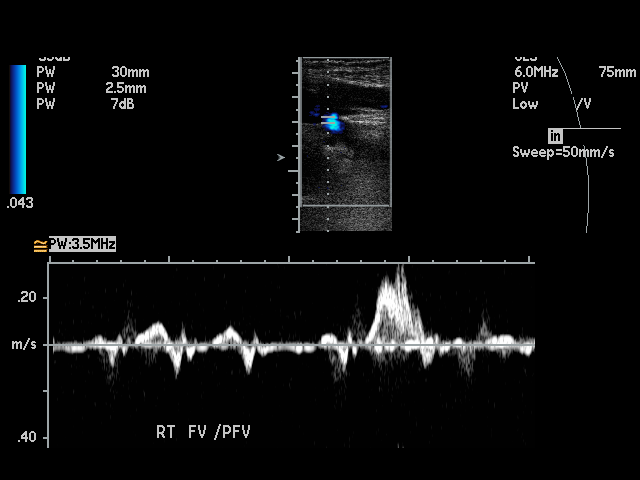
[im 37/41]
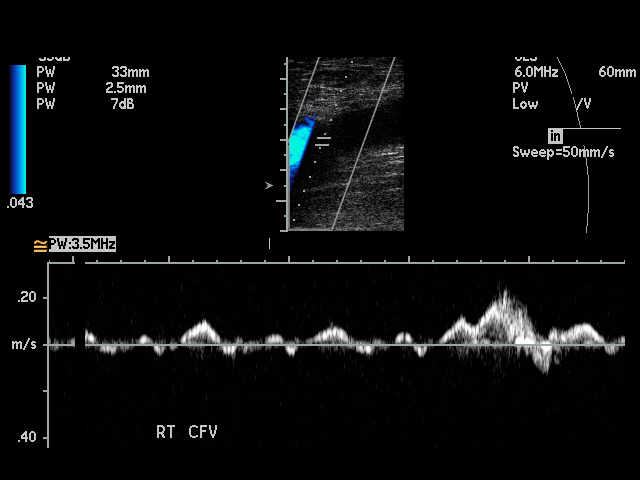
[im 41/41]
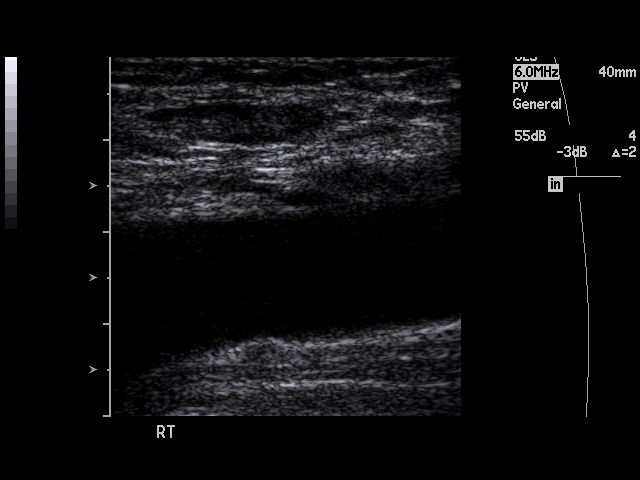

[17 of 24 positions shown; findings below may reference images not displayed]

PROCEDURE:     US  - US DOPPLER LOW EXTR BILATERAL  - [DATE] [DATE]

RESULT:     The augmentation and flow waveforms are normal in appearance.
The femoral and popliteal veins bilaterally show complete compressibility
throughout their course. Bilateral Doppler examination shows no occlusion or
evidence of deep vein thrombosis. Incidental note is made of lymph nodes in
the groins bilaterally.
IMPRESSION: No deep vein thrombosis is identified on either side.

## 2008-01-12 IMAGING — CR DG CHEST 2V
1 series · 2 of 2 positions shown · non-contrast
Comparison: none

REASON FOR EXAM: chf
COMMENTS:

[Series 1: view not recorded · 0.17mm/px · 2 of 2 slices shown]
[im 1/2]
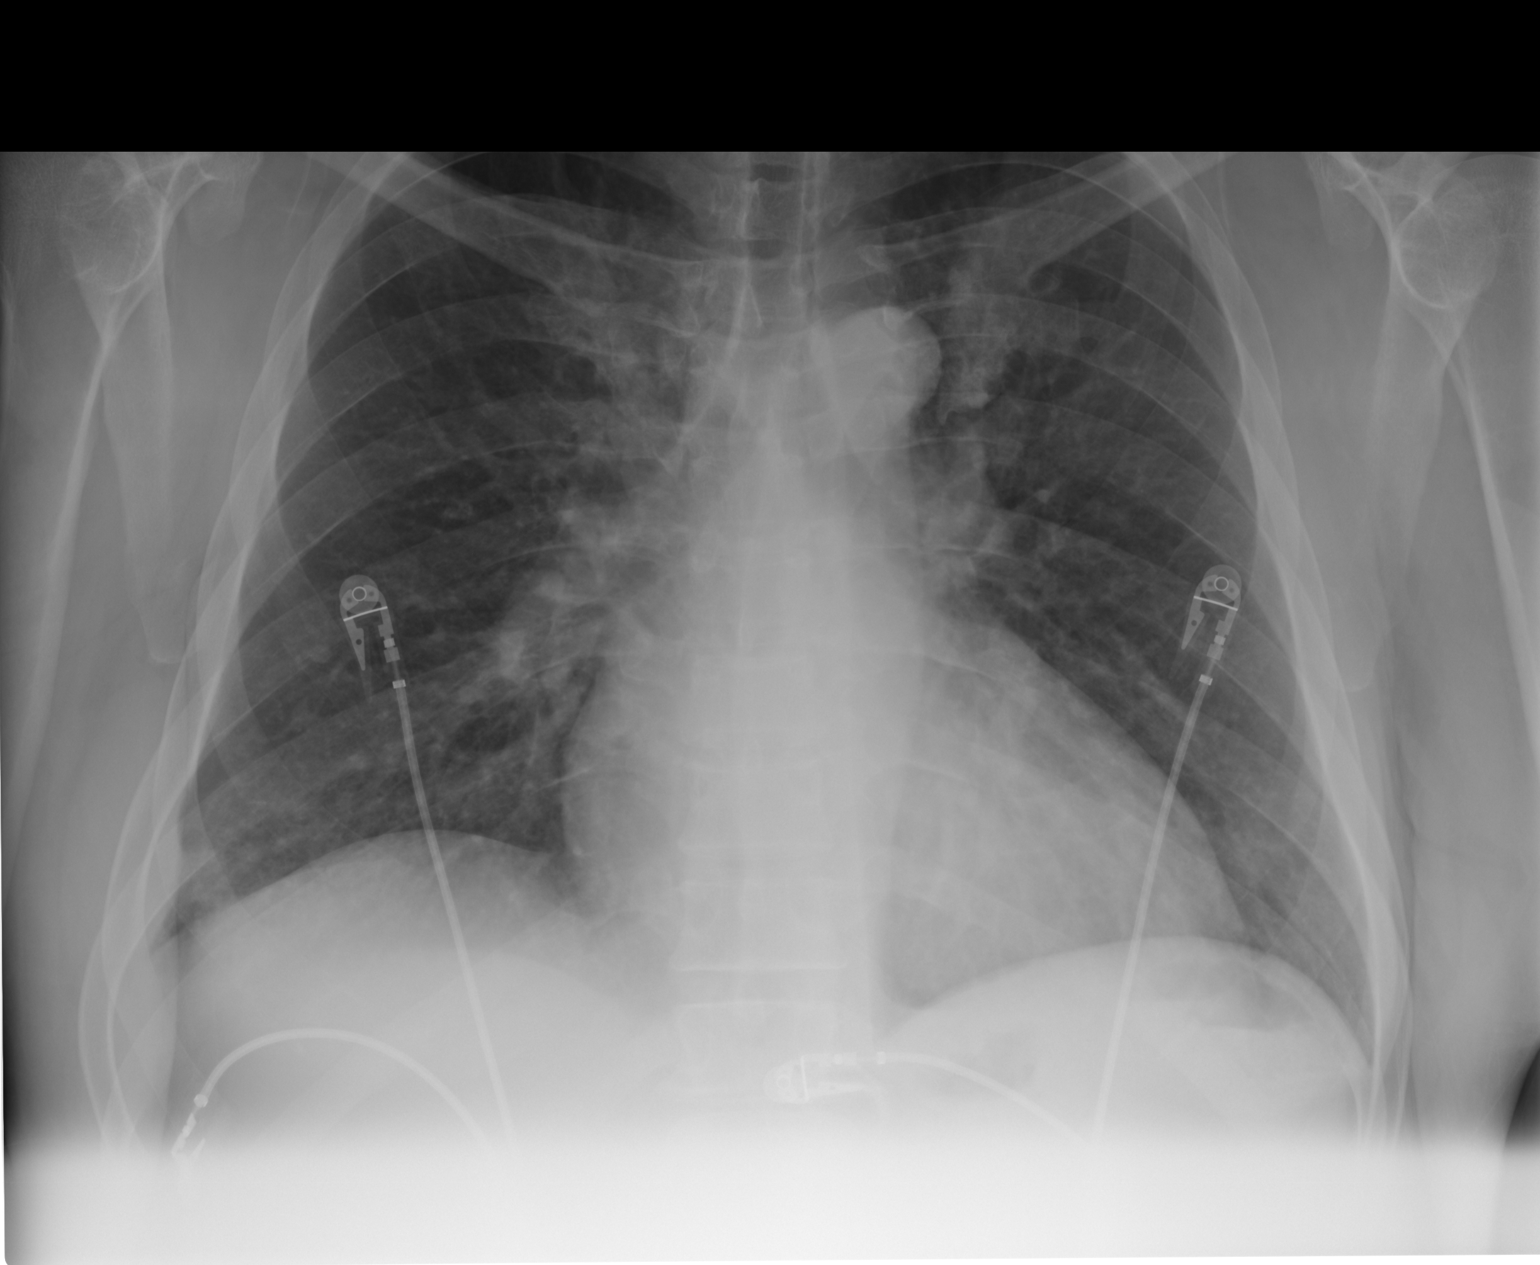
[im 2/2]
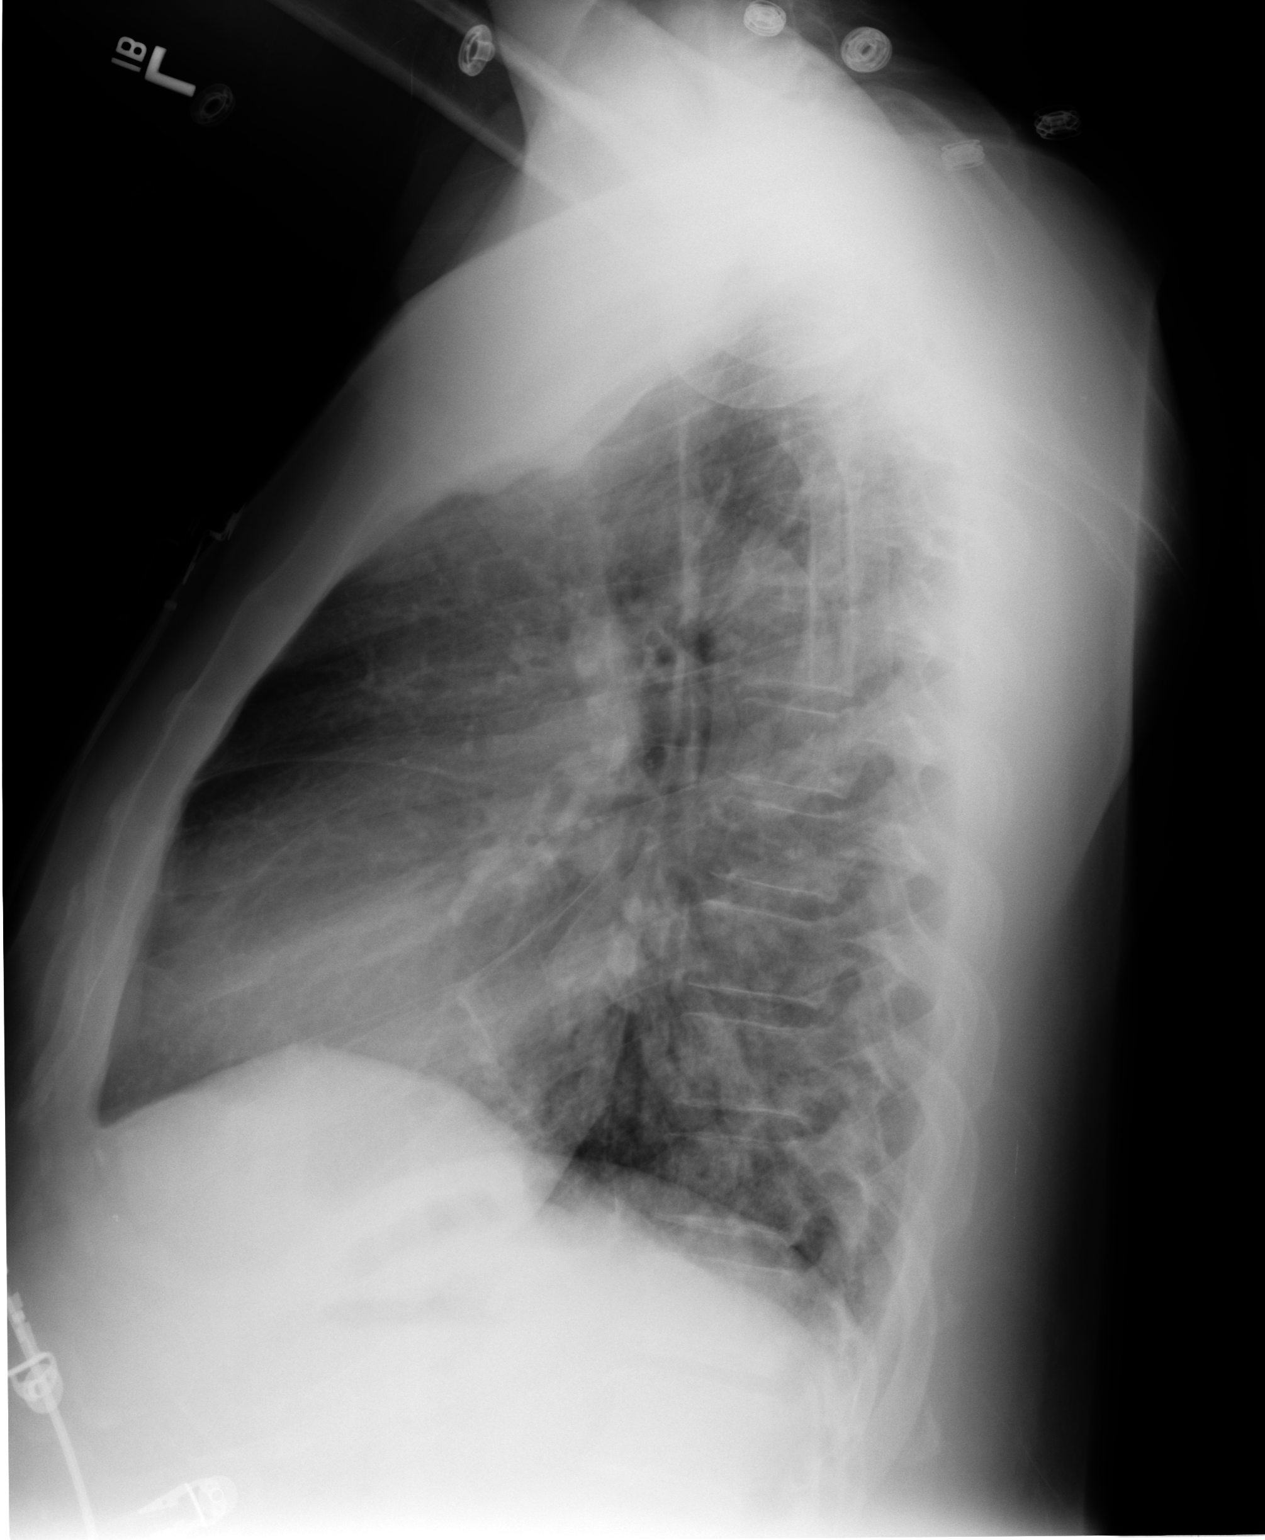

[2 of 2 positions shown; findings below may reference images not displayed]

PROCEDURE:     DXR - DXR CHEST PA (OR AP) AND LATERAL  - [DATE] [DATE]

RESULT:     Comparison is made to the prior exam of [DATE].  There is
mild prominence of the perihilar vascular markings bilaterally compatible
with minimal pulmonary vascular congestion. The appearance is stable or
slightly improved as compared to the prior exam. No new pulmonary
infiltrates are seen. No pleural effusion or pulmonary edema is observed.
The heart is mildly enlarged but is stable as compared to the prior exam.
IMPRESSION: 1. The chest is basically stable in appearance as compared to the exam of
[DATE] except for the perihilar vascular prominence now appearing
slightly improved.
2. No new pulmonary infiltrates are seen.
3. There is mild, stable cardiac enlargement.

## 2008-01-13 IMAGING — NM NUCLEAR MEDICINE HEPATOHBILIARY INCLUDE GB
3 series · 25 of 25 positions shown · non-contrast
Comparison: none

REASON FOR EXAM: Elevated bilirubin
COMMENTS:

[Series 1000: gallbladder statics · 4.80mm/px · 13 of 13 slices shown]
[im 1/13]
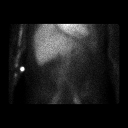
[im 2/13]
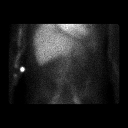
[im 3/13]
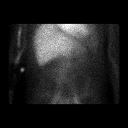
[im 4/13]
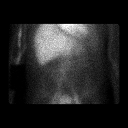
[im 5/13]
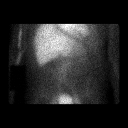
[im 6/13]
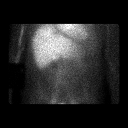
[im 7/13]
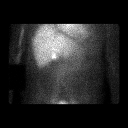
[im 8/13]
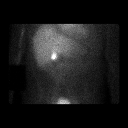
[im 9/13]
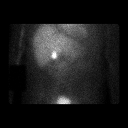
[im 10/13]
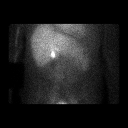
[im 11/13]
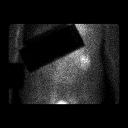
[im 12/13]
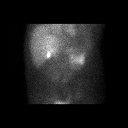
[im 13/13]
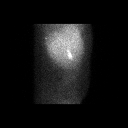

[Series 1000: gallbladder dynamic (results) · 4.80mm/px · 6 of 60 frames shown]
[frame 6/60]
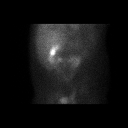
[frame 16/60]
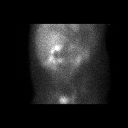
[frame 26/60]
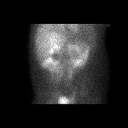
[frame 36/60]
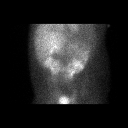
[frame 46/60]
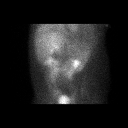
[frame 56/60]
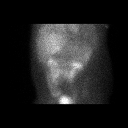

[Series 1000: gallbladder dynamic · 4.80mm/px · 6 of 60 frames shown]
[frame 6/60]
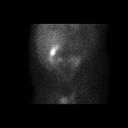
[frame 16/60]
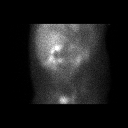
[frame 26/60]
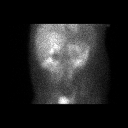
[frame 36/60]
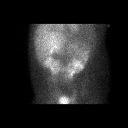
[frame 46/60]
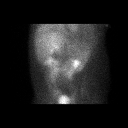
[frame 56/60]
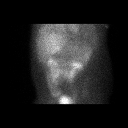

[25 of 25 positions shown; findings below may reference images not displayed]

PROCEDURE:     NM  - NM HEPATO WITH GB EJECT FRACTION  - [DATE] [DATE]

RESULT:     Following intravenous administration of 8.71 mCi of Technetium
99m Choletec, there is noted prompt visualization of tracer activity in the
liver at 3 minutes. Tracer activity is seen in the gallbladder at
approximately 40 minutes and tracer activity is seen in the small bowel at
approximately 85 minutes. There is observed slow clearance of tracer
activity from the liver. Clearance is not complete on the final views that
were obtained at 95 minutes.

The gallbladder ejection fraction measures 70% which is in the normal range.
IMPRESSION: 1.  There is slow clearance of tracer activity from the liver.
2.  At 85 minutes tracer activity is visualized in the gallbladder, common
duct and proximal small bowel.
3.  Gallbladder ejection fraction measures 70% which is in the normal range.

## 2008-01-27 ENCOUNTER — Ambulatory Visit: Payer: Self-pay | Admitting: Nephrology

## 2008-02-28 ENCOUNTER — Ambulatory Visit: Payer: Self-pay | Admitting: Nephrology

## 2008-05-16 ENCOUNTER — Ambulatory Visit: Payer: Self-pay | Admitting: Nephrology

## 2008-05-22 ENCOUNTER — Ambulatory Visit: Payer: Self-pay | Admitting: Nephrology

## 2008-12-26 ENCOUNTER — Inpatient Hospital Stay: Payer: Self-pay | Admitting: Internal Medicine

## 2008-12-26 IMAGING — CR DG ABDOMEN 2V
1 series · 3 of 3 positions shown · non-contrast
Comparison: none

REASON FOR EXAM: pain
COMMENTS:

PROCEDURE:     DXR - DXR ABDOMEN 2 V FLAT AND ERECT  - [DATE]  [DATE]
RESULT:     Images of the abdomen show no definite stones. There is no
organomegaly or abnormal bowel distention. The lung bases are clear. The
bony structures appear unremarkable.

[Series 1: view not recorded · 0.17mm/px · 3 of 3 slices shown]
[im 1/3]
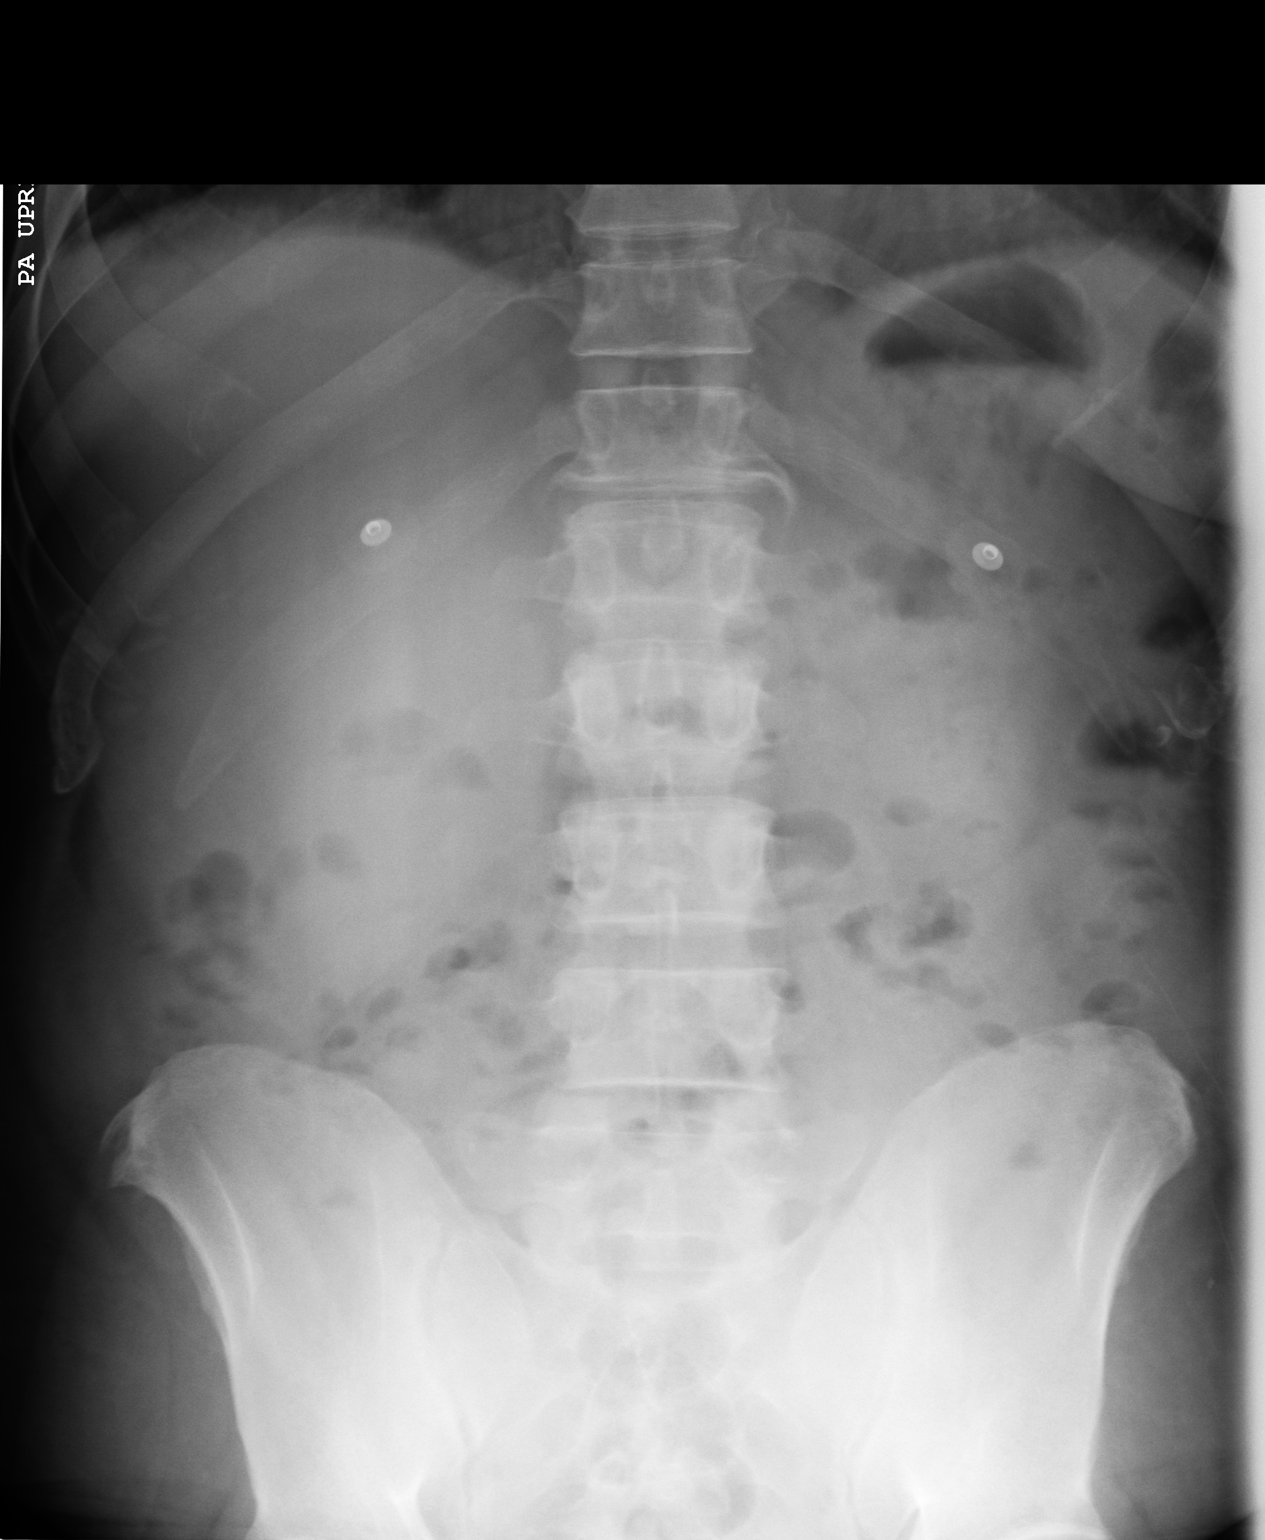
[im 2/3]
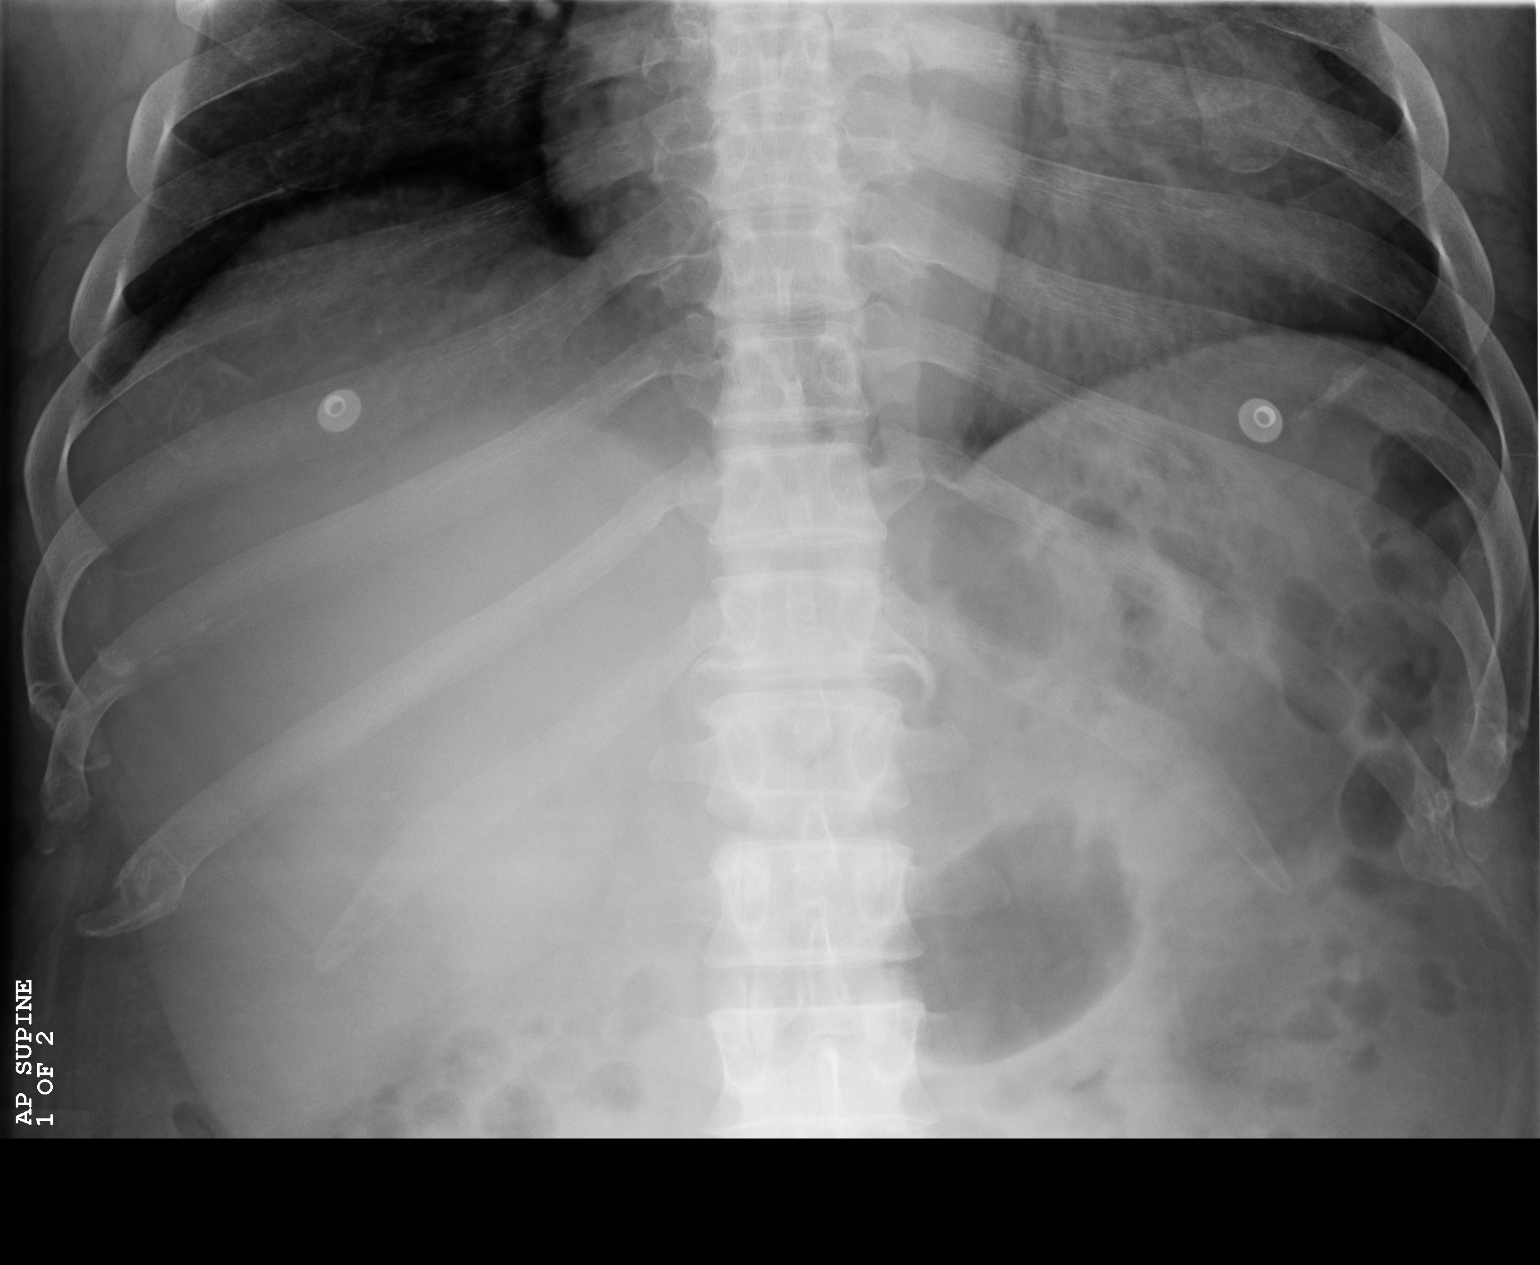
[im 3/3]
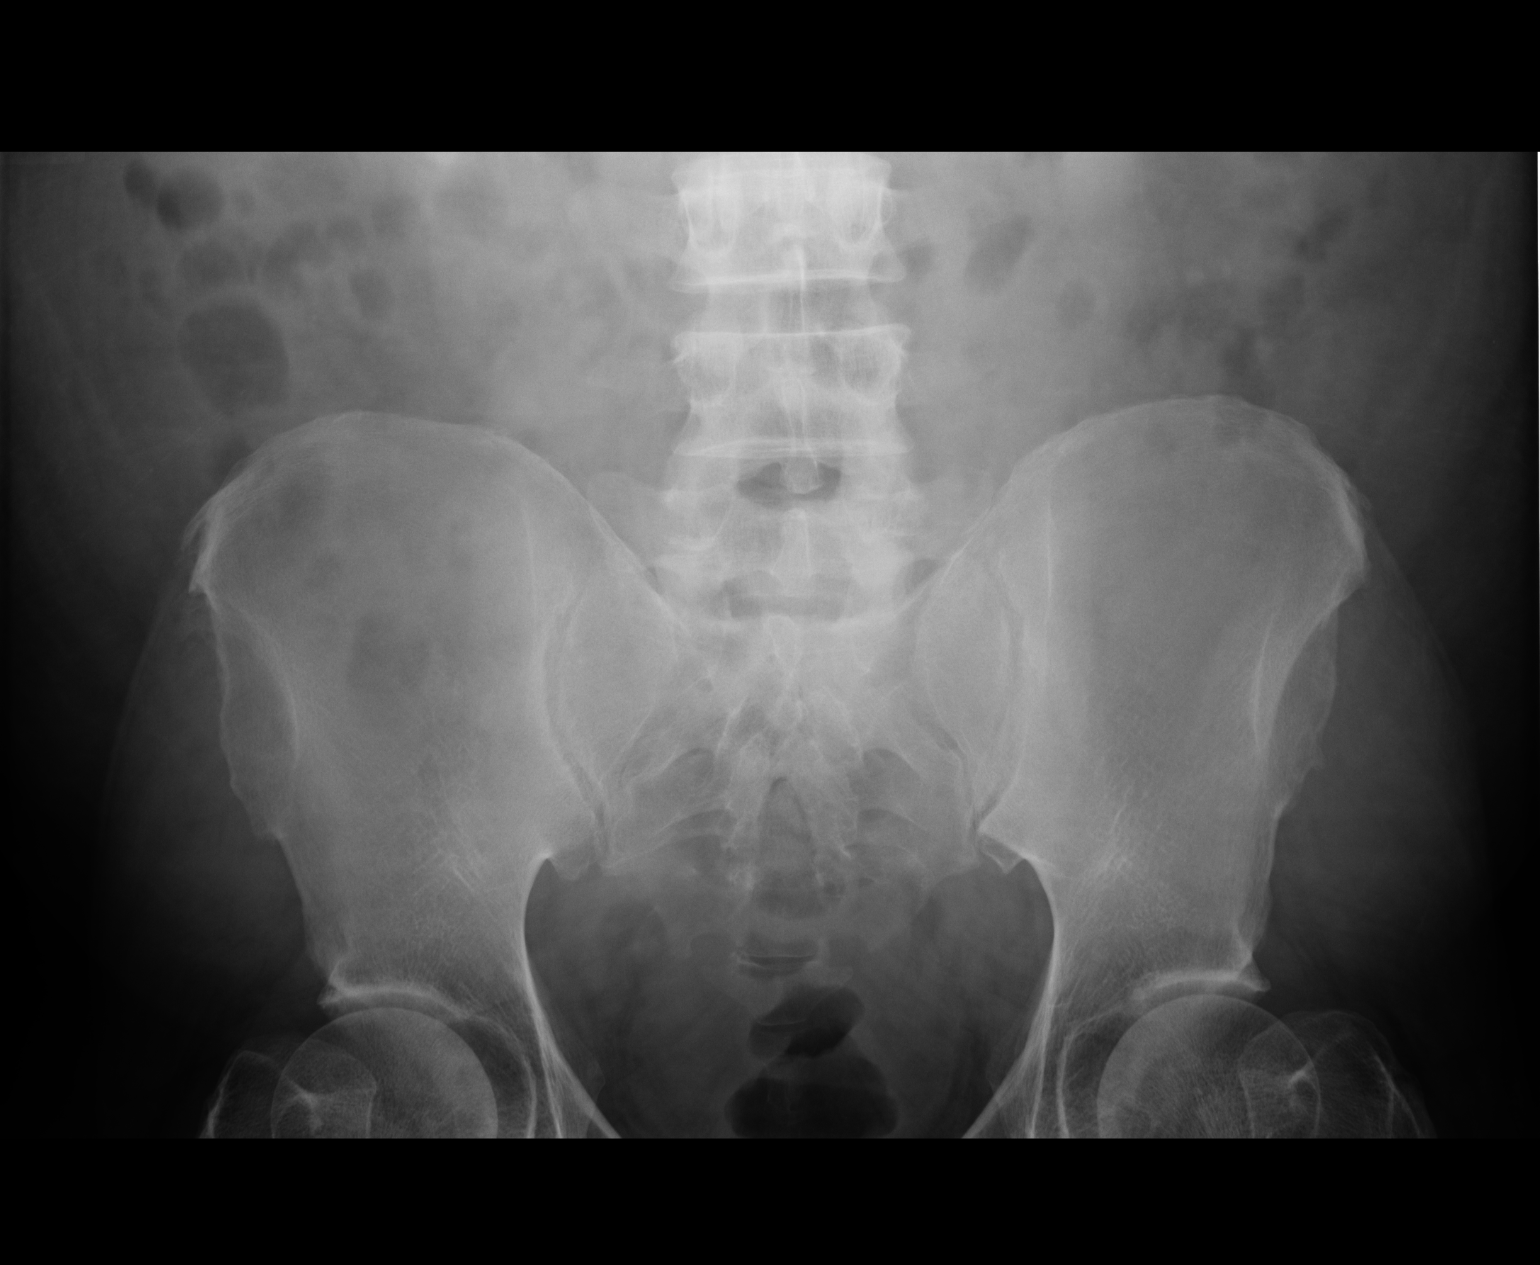

[3 of 3 positions shown; findings below may reference images not displayed]

IMPRESSION: No definite bowel obstruction or perforation.

## 2008-12-26 IMAGING — CR DG CHEST 1V PORT
1 series · 1 of 1 positions shown · non-contrast
Comparison: none

REASON FOR EXAM: SOB
COMMENTS:

[view not recorded]
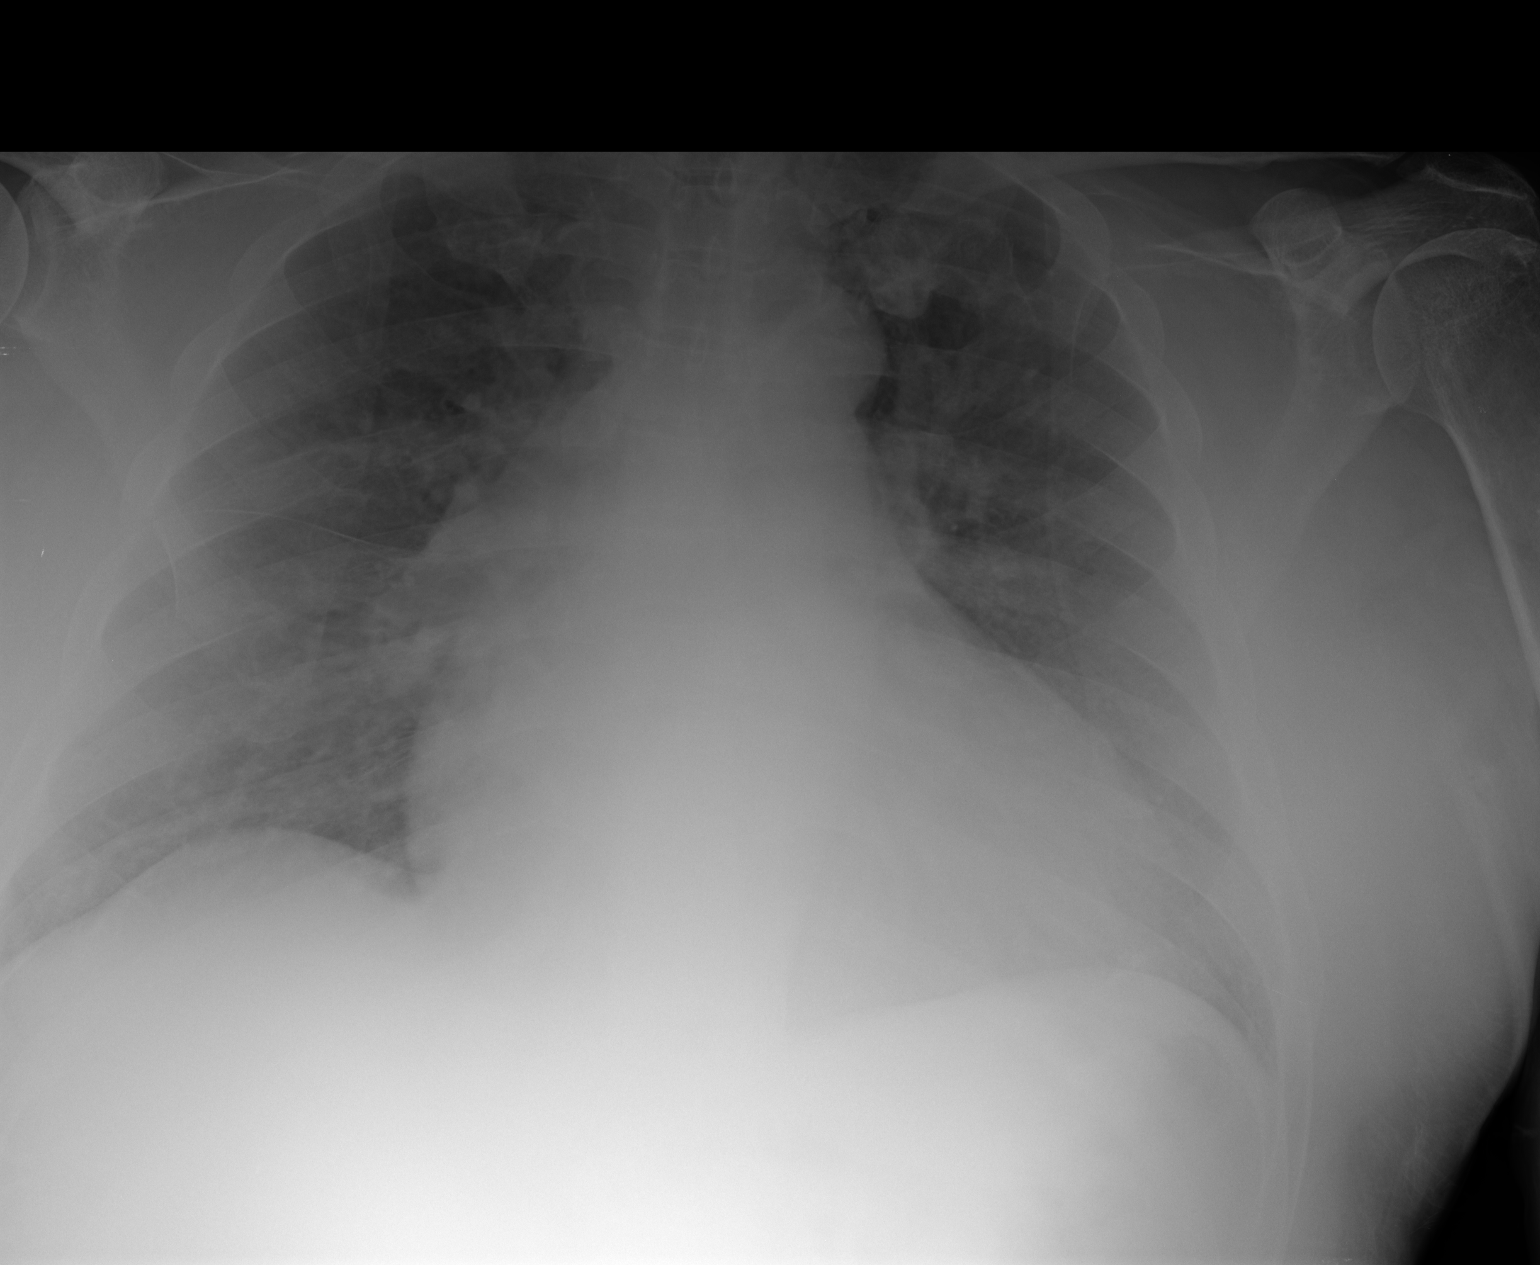

[1 of 1 positions shown; findings below may reference images not displayed]

PROCEDURE:     DXR - DXR PORTABLE CHEST SINGLE VIEW  - [DATE]  [DATE]

RESULT:     Portable AP view of the chest is compared to a prior exam
[DATE]. There is mild prominence of the pulmonary vascularity compatible
with pulmonary vascular congestion. No pleural effusion or frank pulmonary
edema is seen. No pneumonia is noted. The heart is enlarged but stable as
compared to the prior exam.
IMPRESSION: 1. The chest shows changes of recurrent pulmonary vascular congestion. The
findings are less prominent than on the exam of [DATE].
[DATE]. Stable cardiomegaly.
3. No pneumonia is seen.

## 2008-12-27 IMAGING — US ABDOMEN ULTRASOUND
1 series · 17 of 25 positions shown · non-contrast
Comparison: none

REASON FOR EXAM: elevated bilirubin with liver disease- r/o obstruction
COMMENTS:

[Series 1: abdomen ultrasound · 17 of 100 slices shown]
[im 1/100]
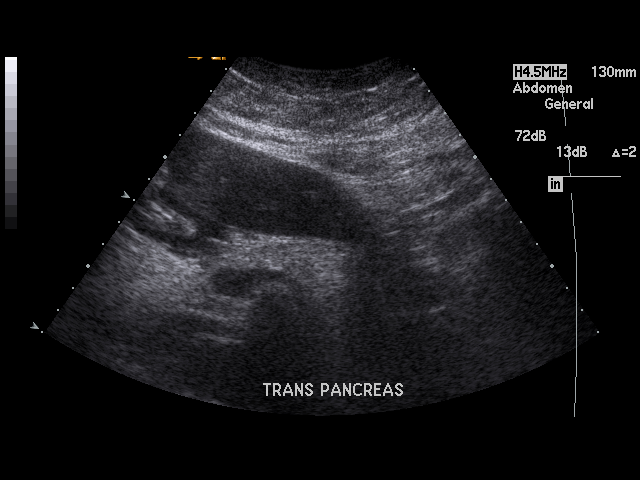
[im 9/100]
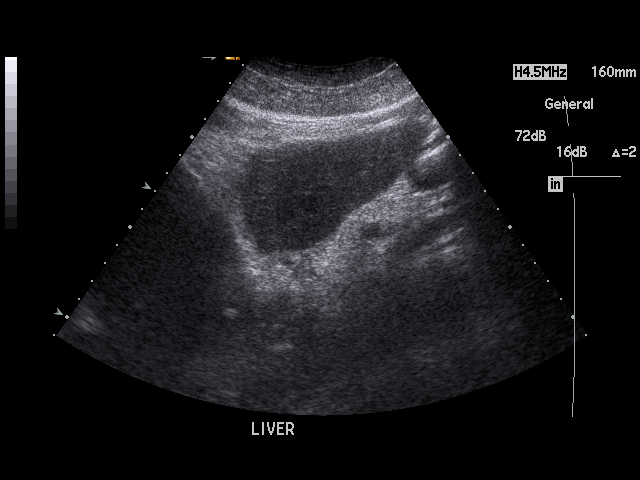
[im 13/100]
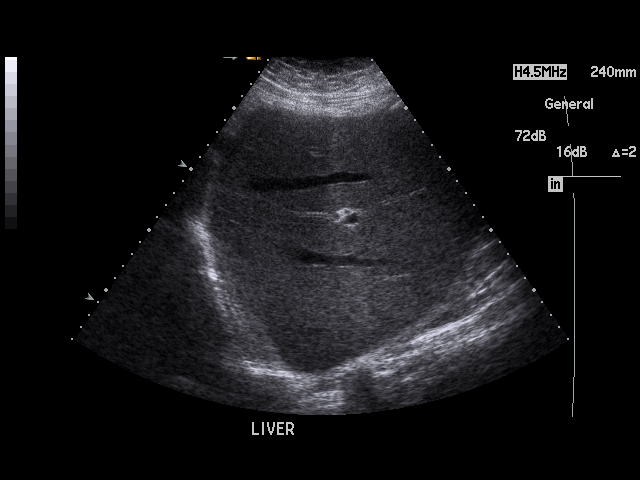
[im 21/100]
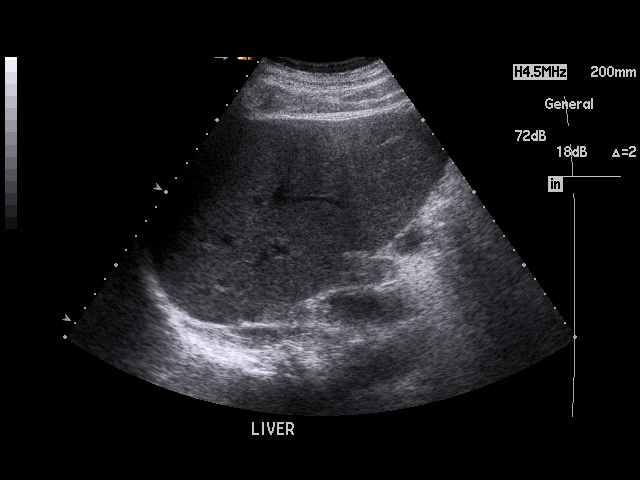
[im 25/100]
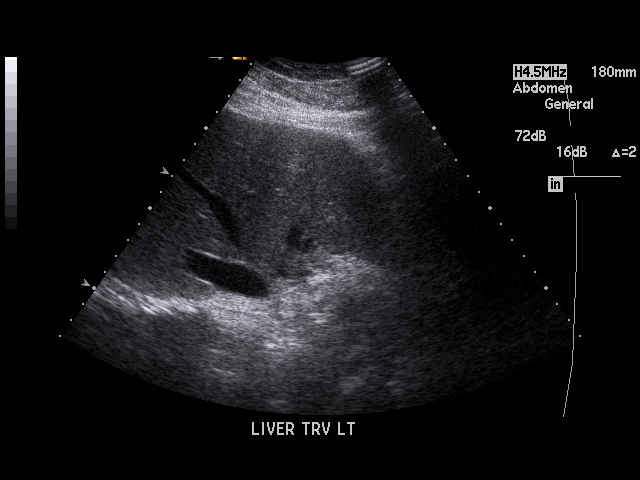
[im 34/100]
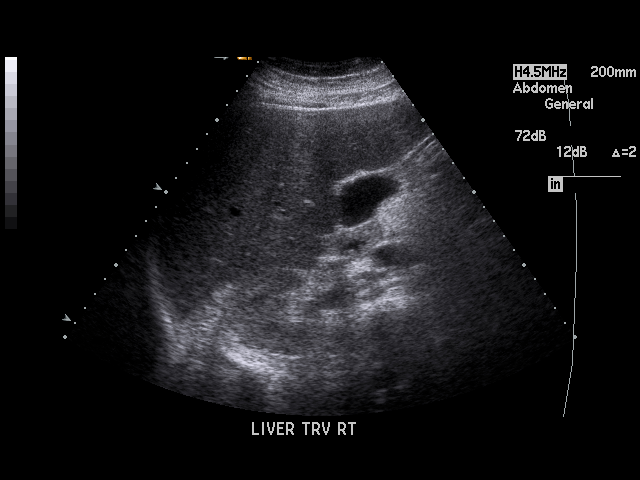
[im 38/100]
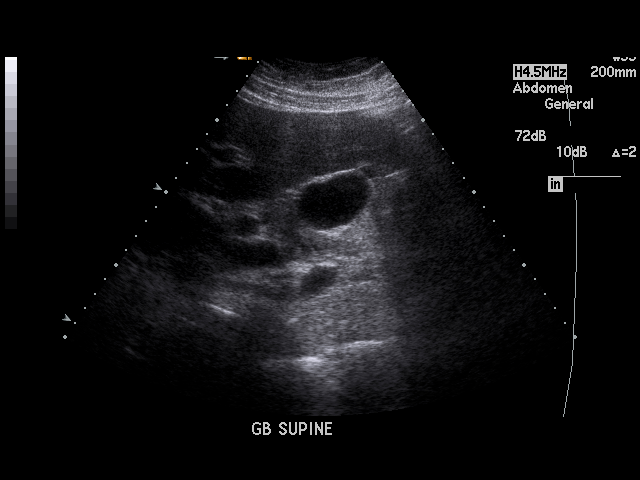
[im 46/100]
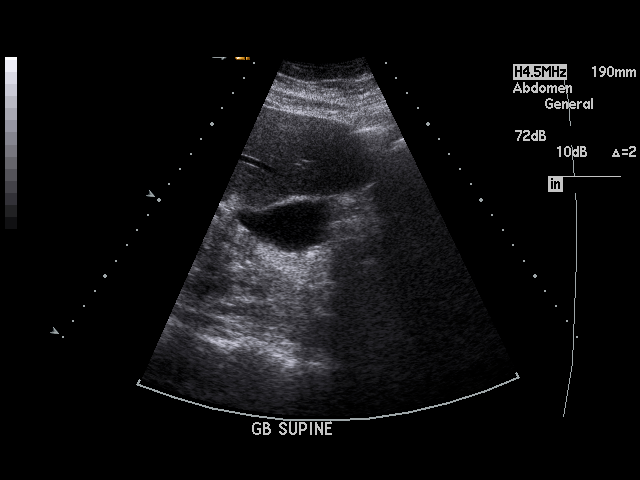
[im 50/100]
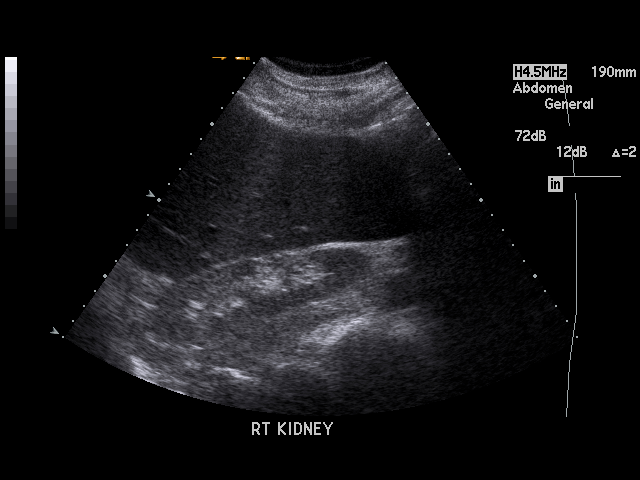
[im 54/100]
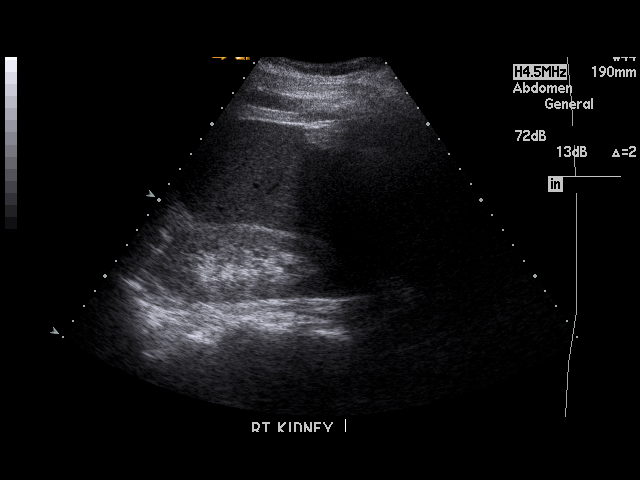
[im 62/100]
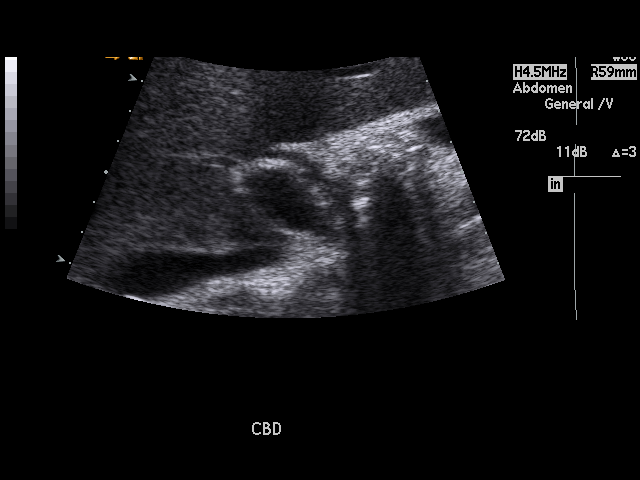
[im 67/100]
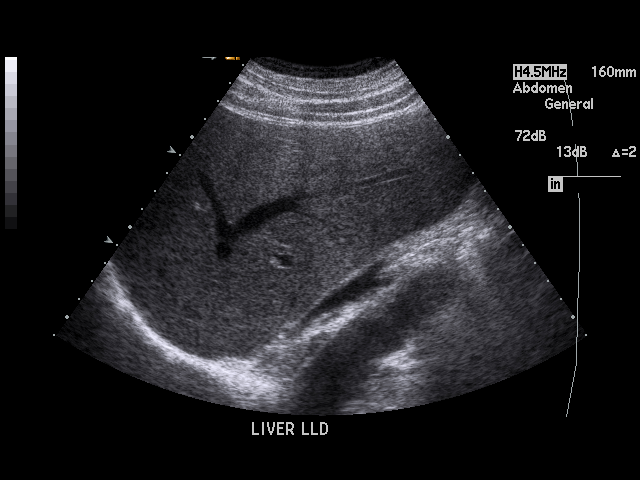
[im 75/100]
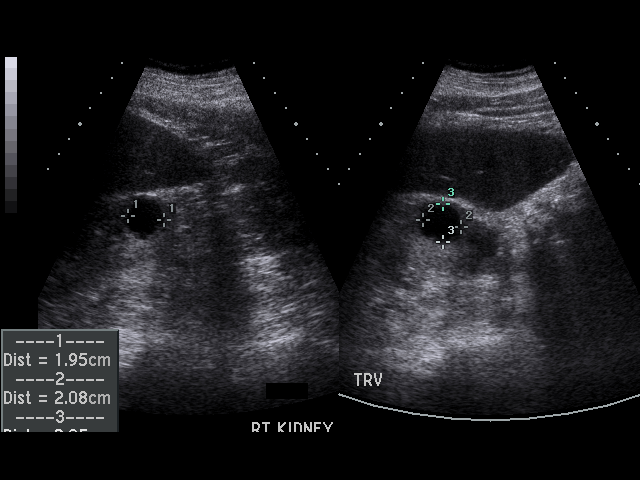
[im 79/100]
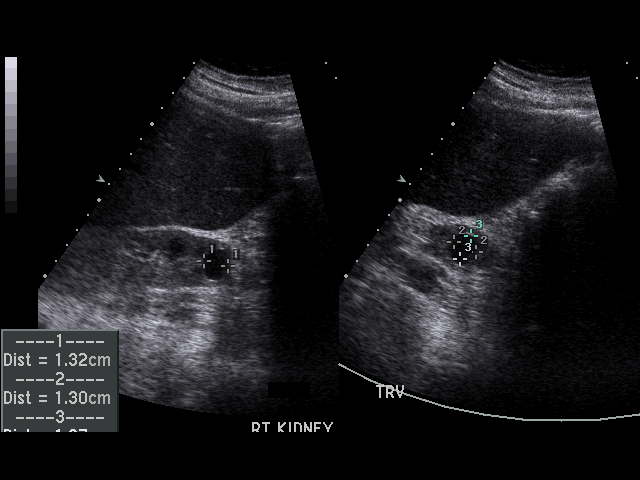
[im 87/100]
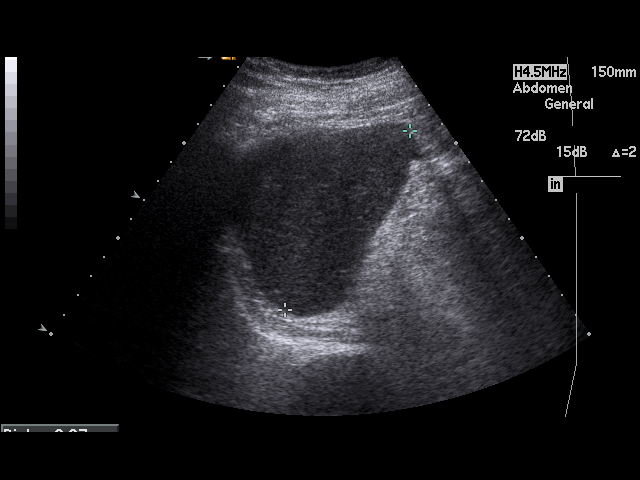
[im 91/100]
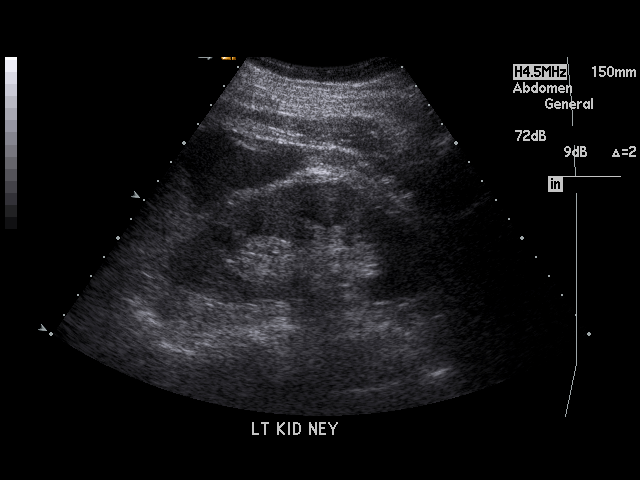
[im 100/100]
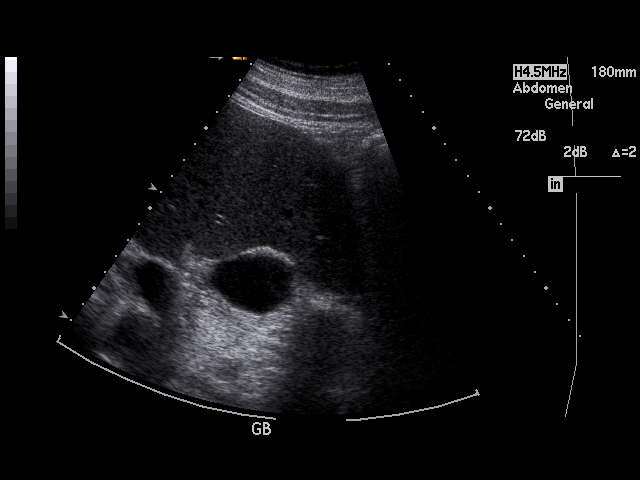

[17 of 25 positions shown; findings below may reference images not displayed]

PROCEDURE:     US  - US ABDOMEN GENERAL SURVEY  - [DATE] [DATE]

RESULT:     The liver and spleen show no significant abnormalities. There is
a trace of ascites observed. Portions of the pancreas and abdominal aorta
are obscured but the visualized portions are normal in appearance. No
gallstones are seen. There is slight thickening of the gallbladder wall
which measures 3.4 mm in thickness. This is less than was observed on the
prior exam of [DATE]. Gallbladder wall thickening in a patient with
ascites is a nonspecific finding. The common bile duct measures 3.9 mm in
diameter which is within normal limits. The kidneys show no hydronephrosis.
Two cysts are seen in the right kidney with the larger measuring 2.08 cm at
maximum diameter.
IMPRESSION: 1. Trace ascites.
2. No gallstones are seen.
3. There is slight nonspecific thickening of the gallbladder wall.
4. A few small right renal cysts are observed.

## 2009-12-24 HISTORY — PX: NM MYOVIEW LTD: HXRAD82

## 2010-01-20 ENCOUNTER — Inpatient Hospital Stay: Payer: Self-pay | Admitting: Internal Medicine

## 2010-01-20 IMAGING — CR DG ABDOMEN 3V
1 series · 5 of 5 positions shown · non-contrast
Comparison: none

REASON FOR EXAM: Pain
COMMENTS:   May transport without cardiac monitor

PROCEDURE:     DXR - DXR ABDOMEN 3-WAY (INCL PA CXR)  - [DATE]  [DATE]
RESULT:     Comparisons:  None

[Series 1: view not recorded · 0.17mm/px · 5 of 5 slices shown]
[im 1/5]
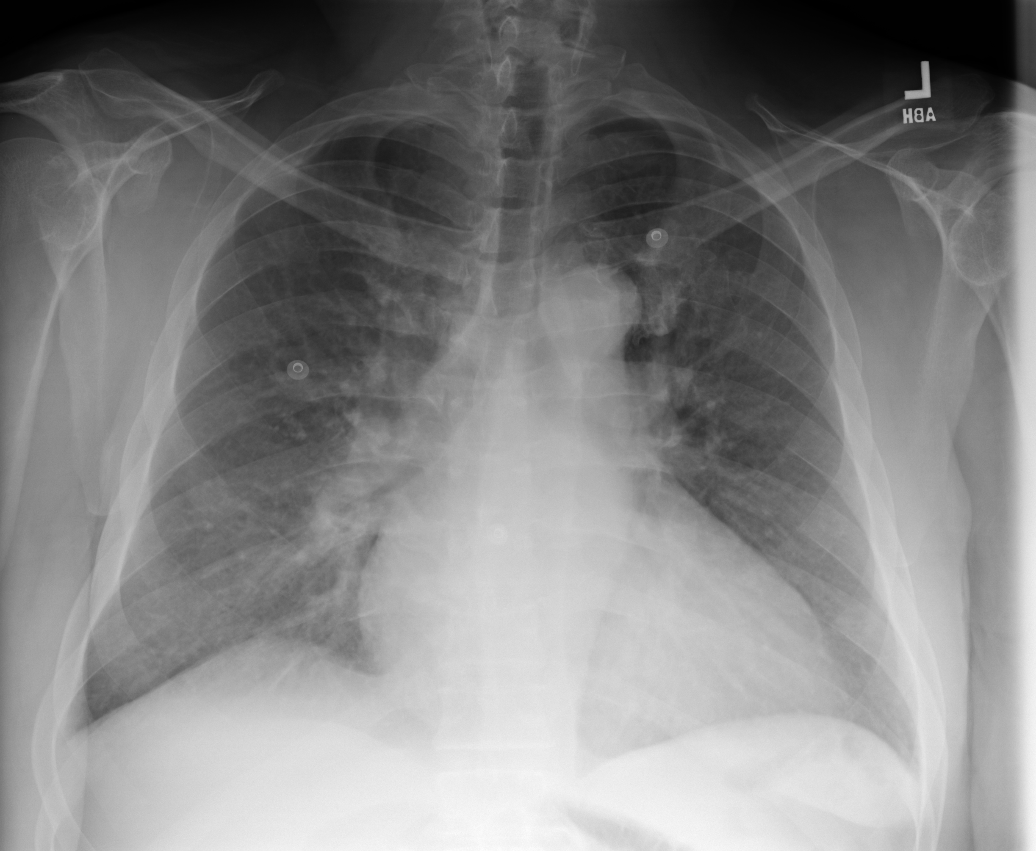
[im 2/5]
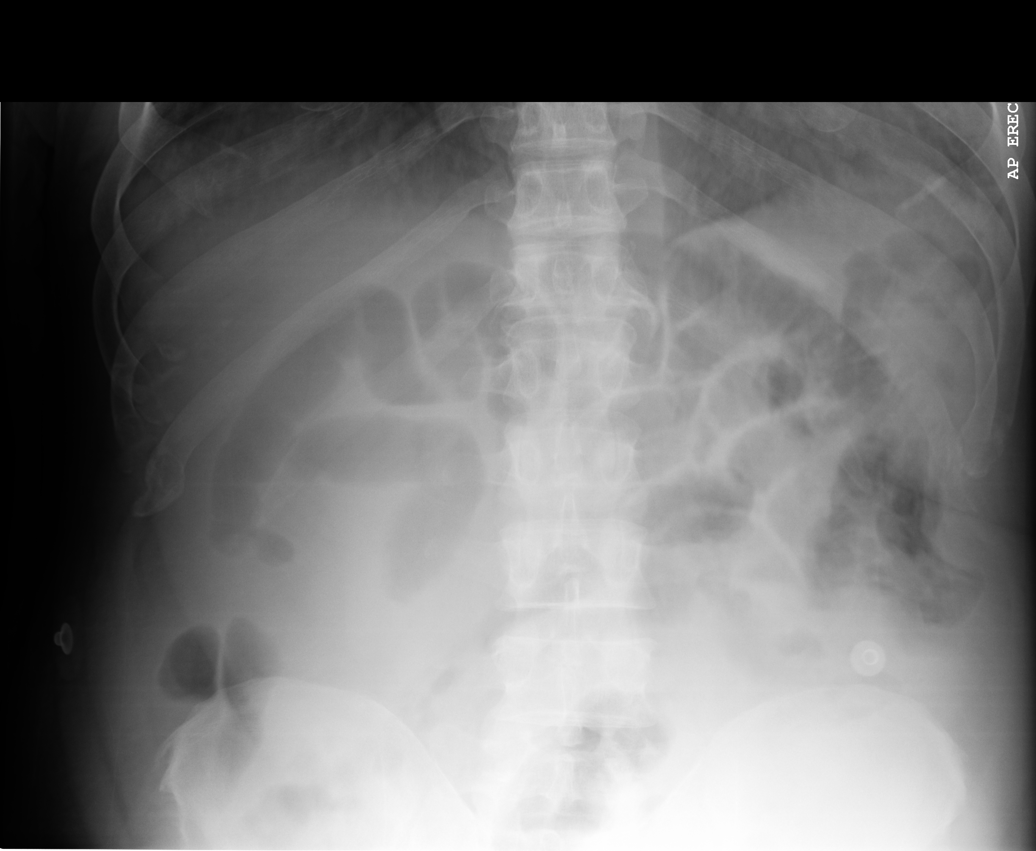
[im 3/5]
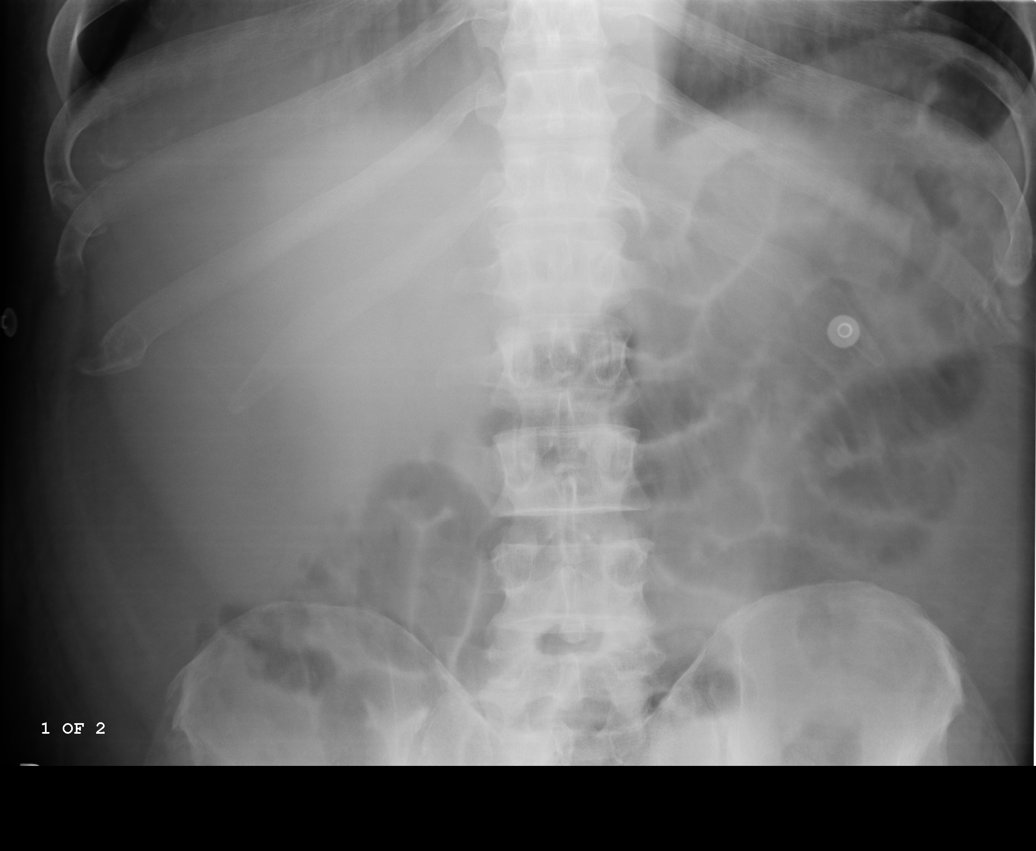
[im 4/5]
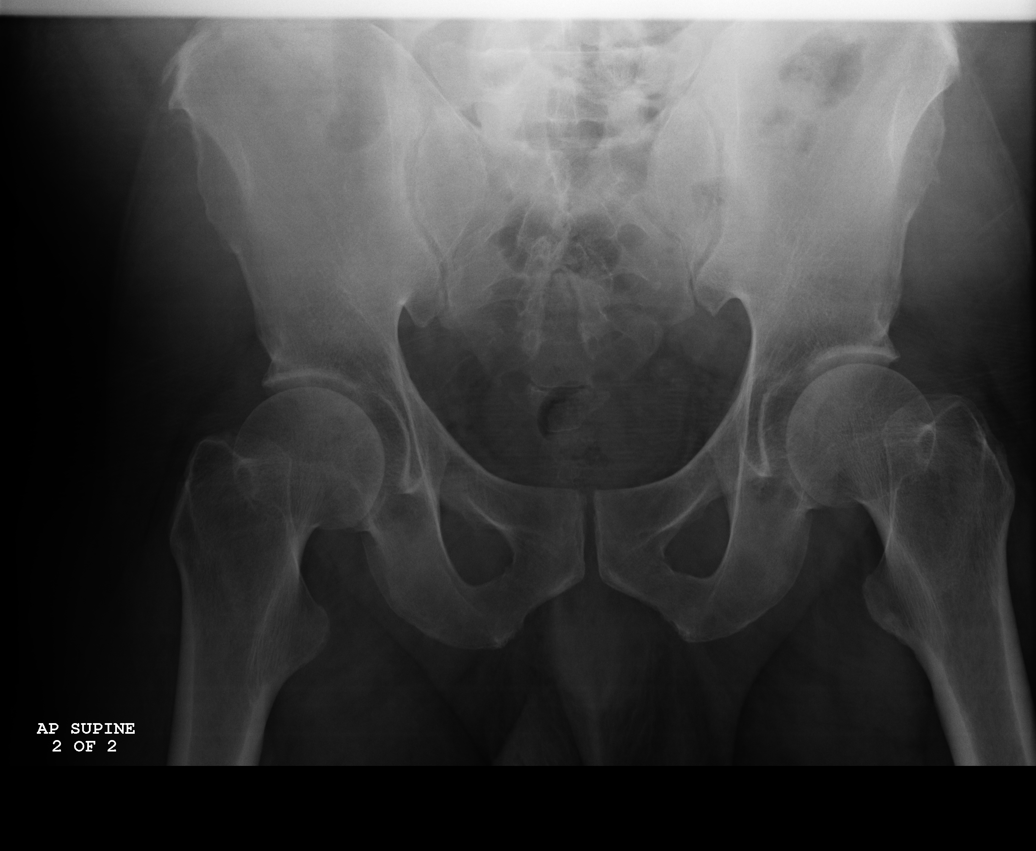
[im 5/5]
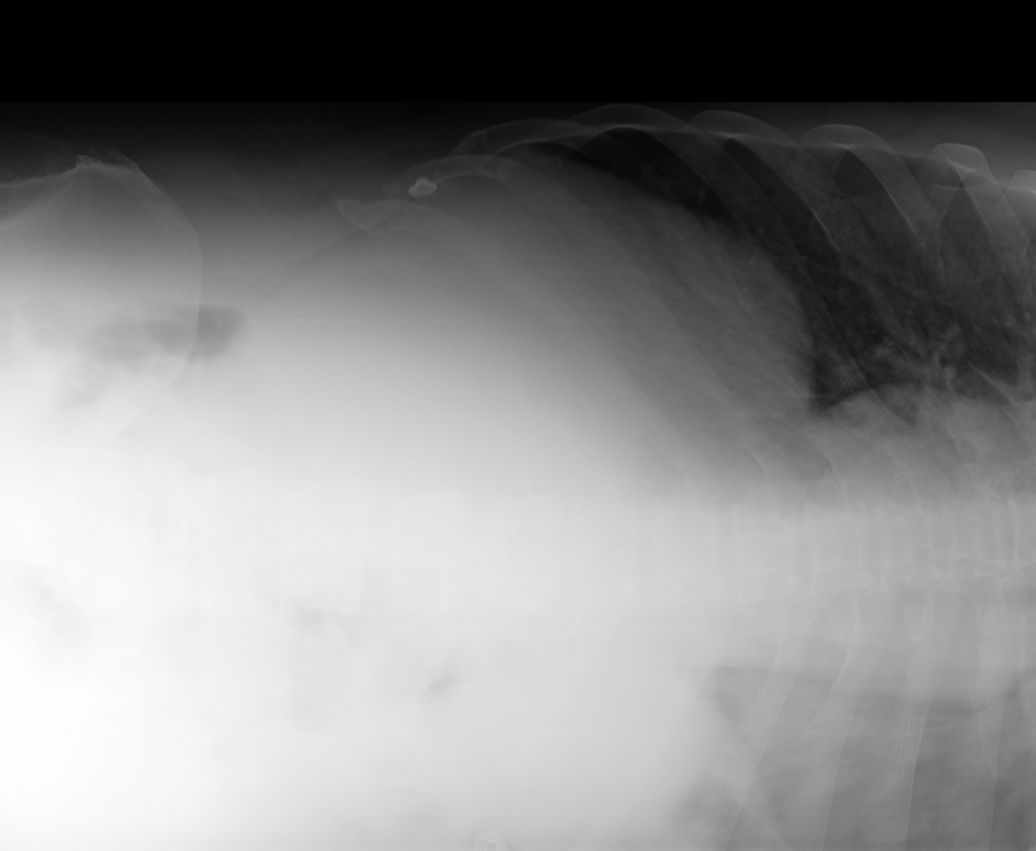

[5 of 5 positions shown; findings below may reference images not displayed]

FINDINGS: PA chest and supine, upright, and left lateral decubitus views of the
abdomen are provided.

The chest is clear. The heart size is enlarged. There is prominence of the
central pulmonary vasculature. The osseous structures are unremarkable.

There is a nonspecific bowel gas pattern. There is no bowel dilatation to
suggest obstruction. There are no air-fluid levels. There are no dilated
loops, bowel wall thickening, or evidence of mass effect.  Soft tissue
shadows are normal. There is no evidence of pneumoperitoneum, portal venous
gas, or pneumatosis.

Osseous structures are unremarkable.
IMPRESSION: Unremarkable abdominal series.

## 2010-01-21 IMAGING — US US RENAL KIDNEY
1 series · 17 of 25 positions shown · non-contrast
Comparison: None

REASON FOR EXAM: decreased urine output
COMMENTS:

PROCEDURE:     US  - US KIDNEY  - [DATE]  [DATE]
RESULT:     Indication: Decreased urine output

[Series 1: us renal kidney · 17 of 41 slices shown]
[im 1/41]
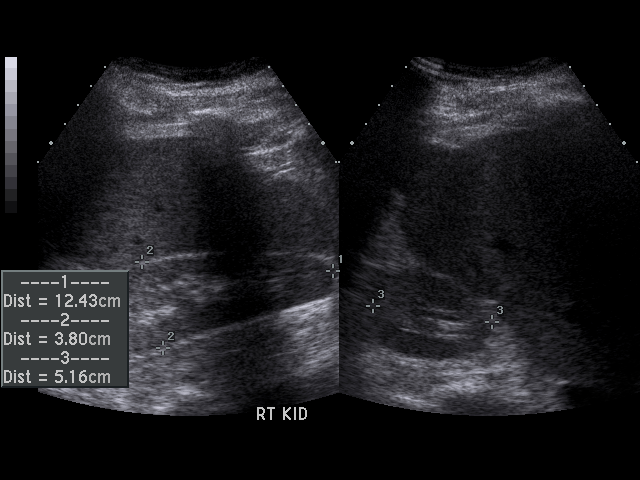
[im 4/41]
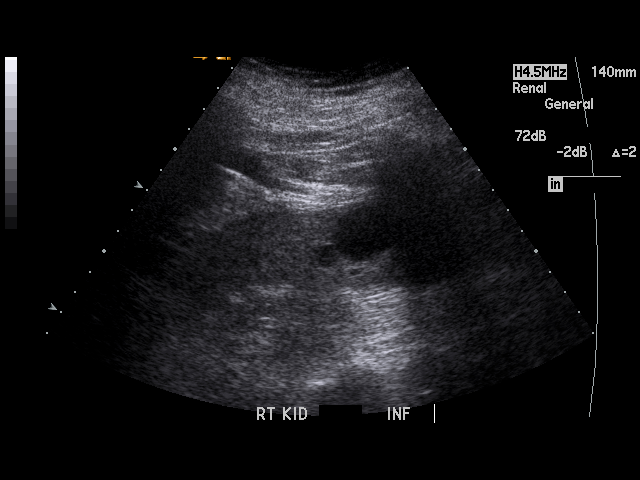
[im 6/41]
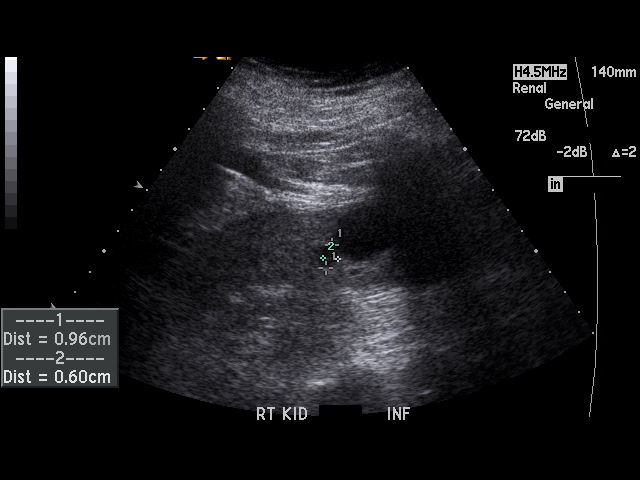
[im 9/41]
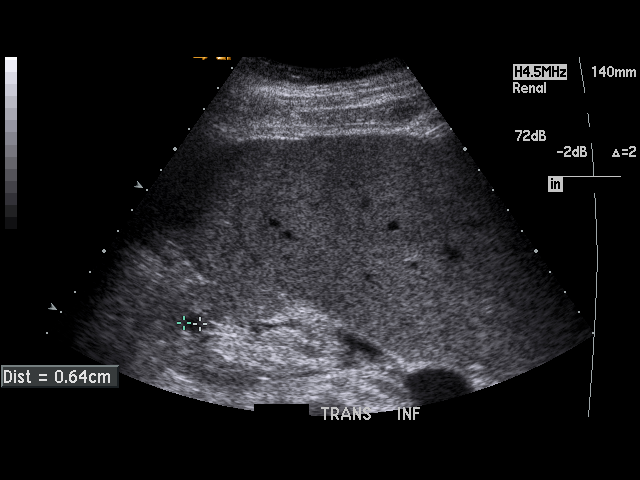
[im 11/41]
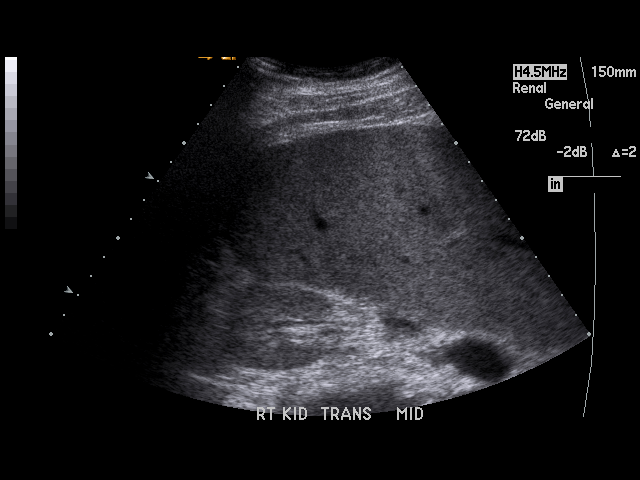
[im 14/41]
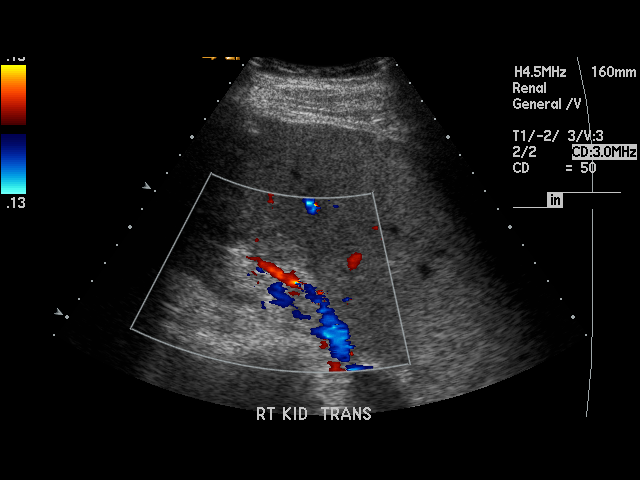
[im 16/41]
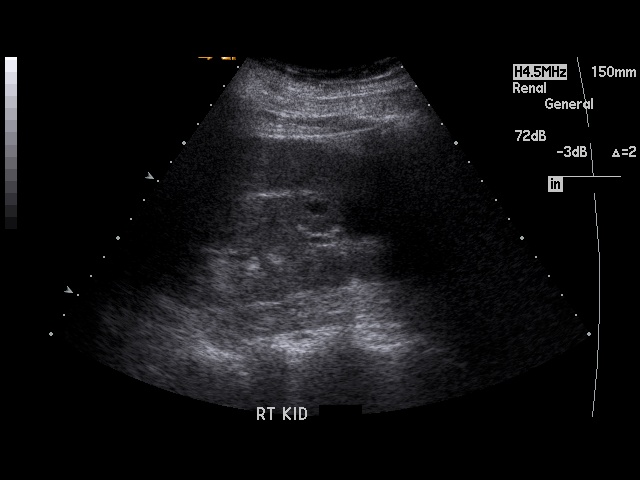
[im 19/41]
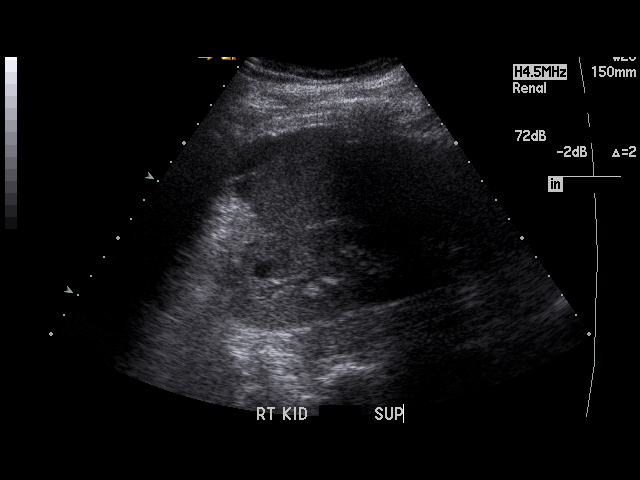
[im 21/41]
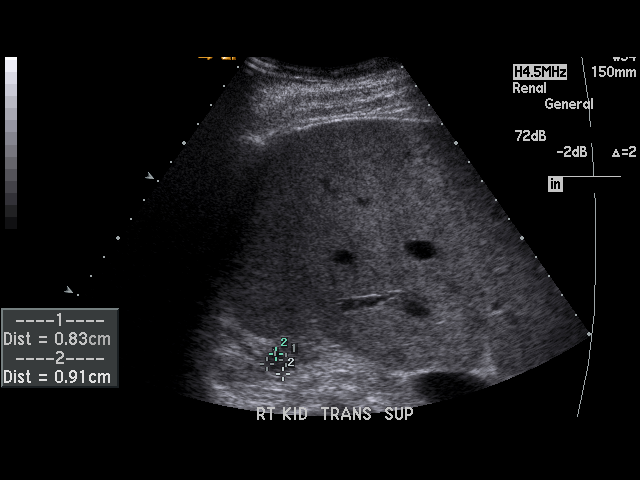
[im 22/41]
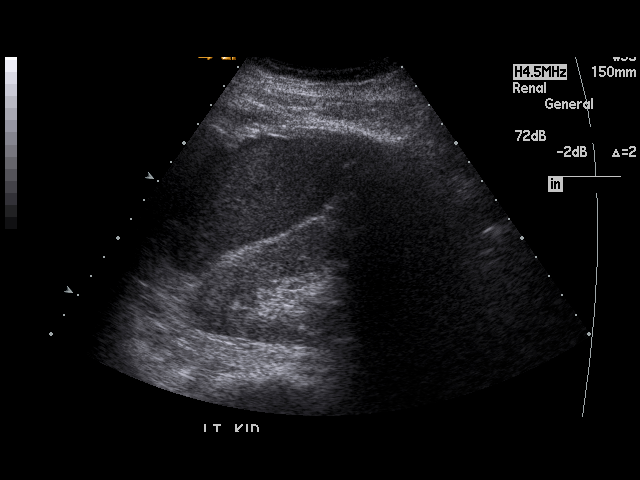
[im 26/41]
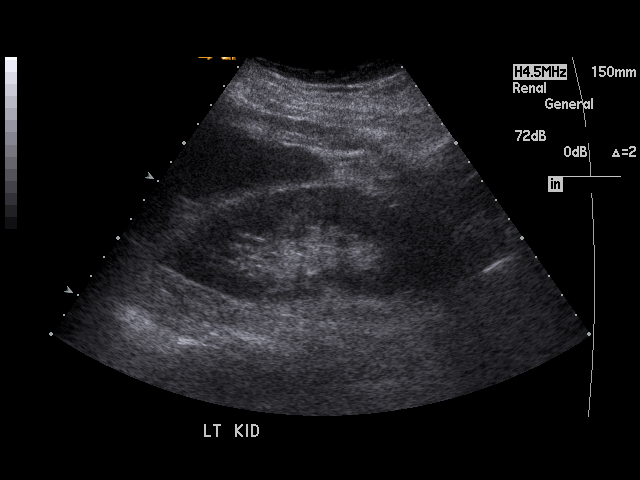
[im 27/41]
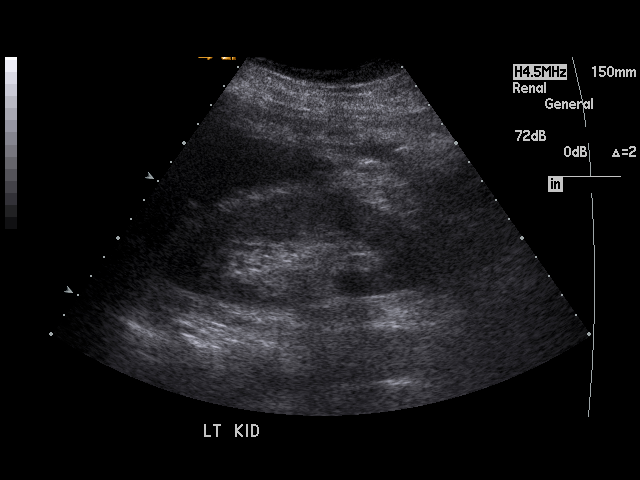
[im 31/41]
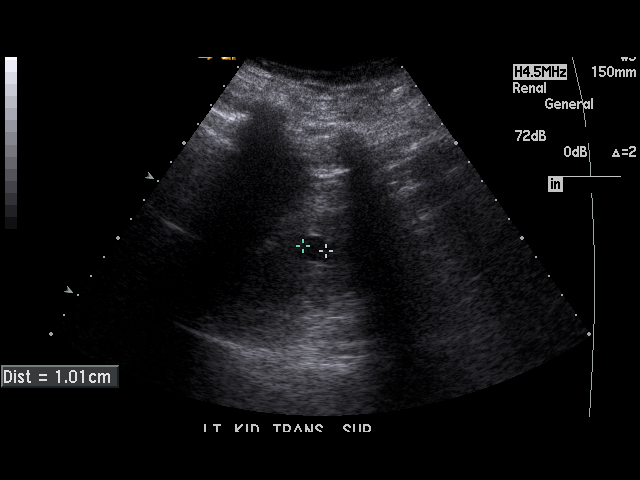
[im 32/41]
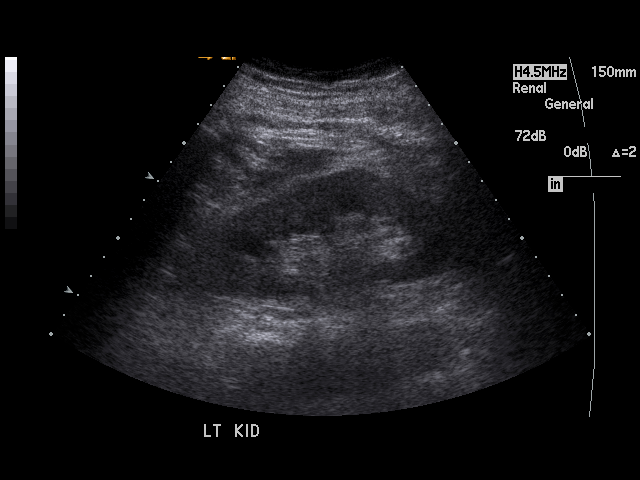
[im 36/41]
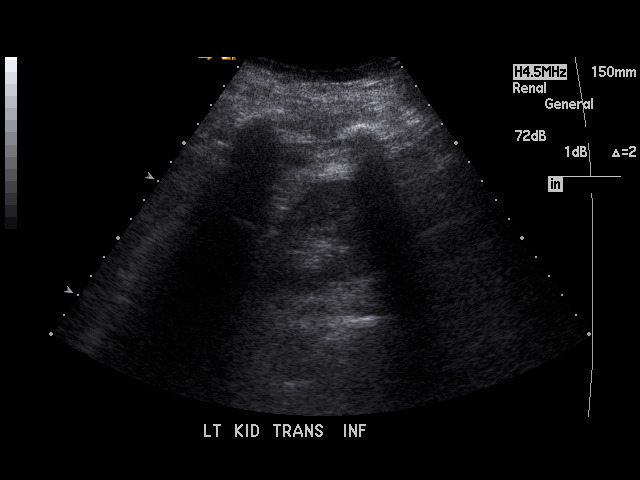
[im 37/41]
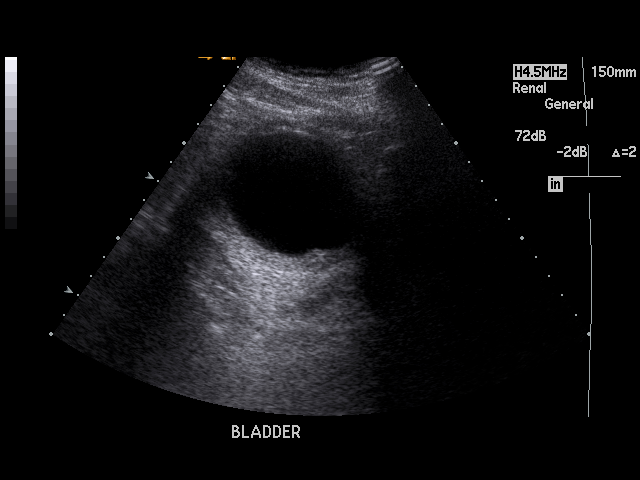
[im 41/41]
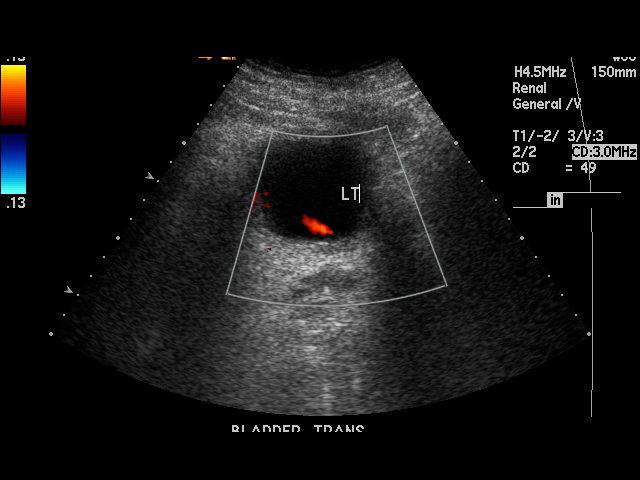

[17 of 25 positions shown; findings below may reference images not displayed]

Technique and findings: Multiple gray-scale and color doppler images of the
kidneys were obtained.

The right kidney measures 12.4 x 3.8 x 5.2 cm and the left kidney measures
11.9 x 4.9 x 4.4 cm. The kidneys are normal in echogenicity. There is no
hydronephrosis.  There are no echogenic foci.  There are 3 anechoic right
renal masses with the largest measuring 2.2 x 2.2 x 2.6 cm most consistent
with cysts. There is an anechoic left renal mass measuring 1.3 x 0.9 x
cm most consistent with a cyst. There is no free fluid in the region of the
renal fossa.

The bladder is unremarkable. Bilateral ureteral jets are noted.
IMPRESSION: 1. No obstructive uropathy.
2. Bilateral renal cysts.

## 2010-01-28 ENCOUNTER — Emergency Department: Payer: Self-pay | Admitting: Emergency Medicine

## 2010-02-03 ENCOUNTER — Other Ambulatory Visit: Payer: Self-pay | Admitting: Nephrology

## 2010-07-26 ENCOUNTER — Inpatient Hospital Stay: Payer: Self-pay | Admitting: Unknown Physician Specialty

## 2010-07-26 IMAGING — CR DG KNEE COMPLETE 4+V*R*
1 series · 4 of 4 positions shown · non-contrast
Comparison: none

REASON FOR EXAM: fall pain to rt knee
COMMENTS:

[Series 1: view not recorded · 0.17mm/px · 4 of 4 slices shown]
[im 1/4]
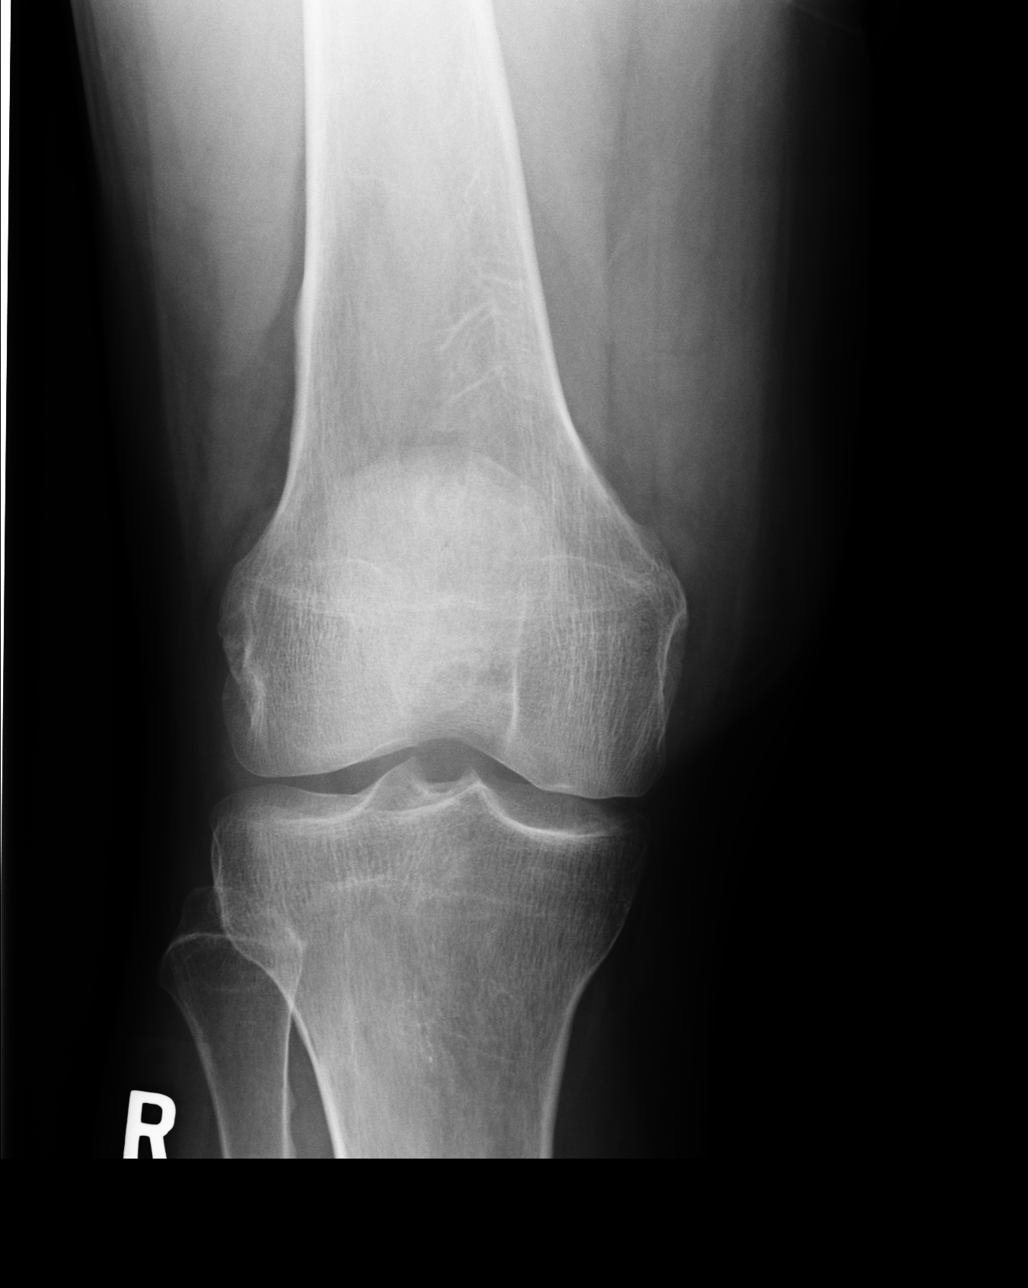
[im 2/4]
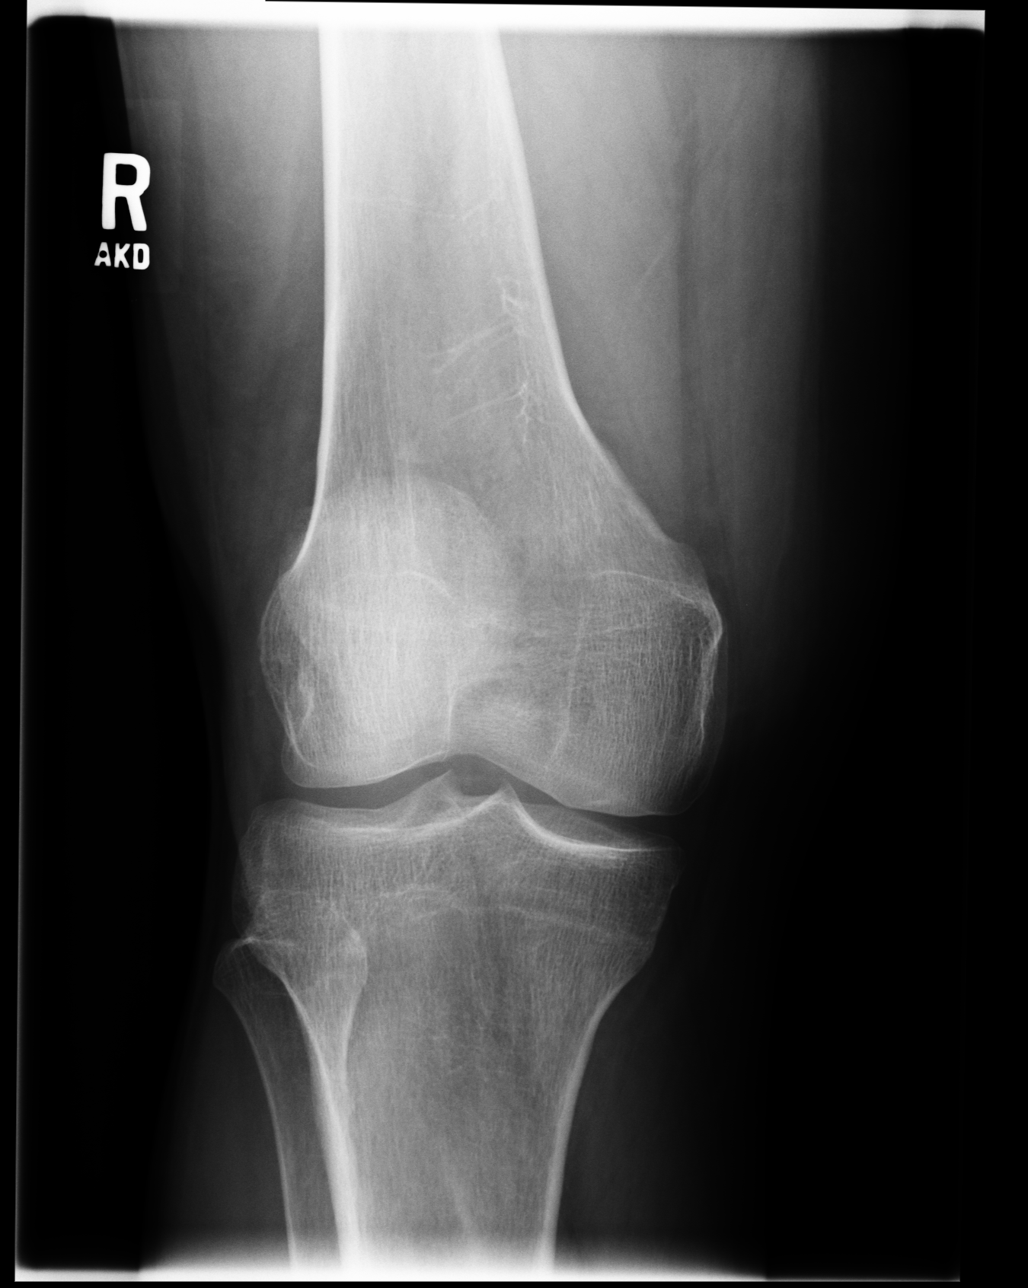
[im 3/4]
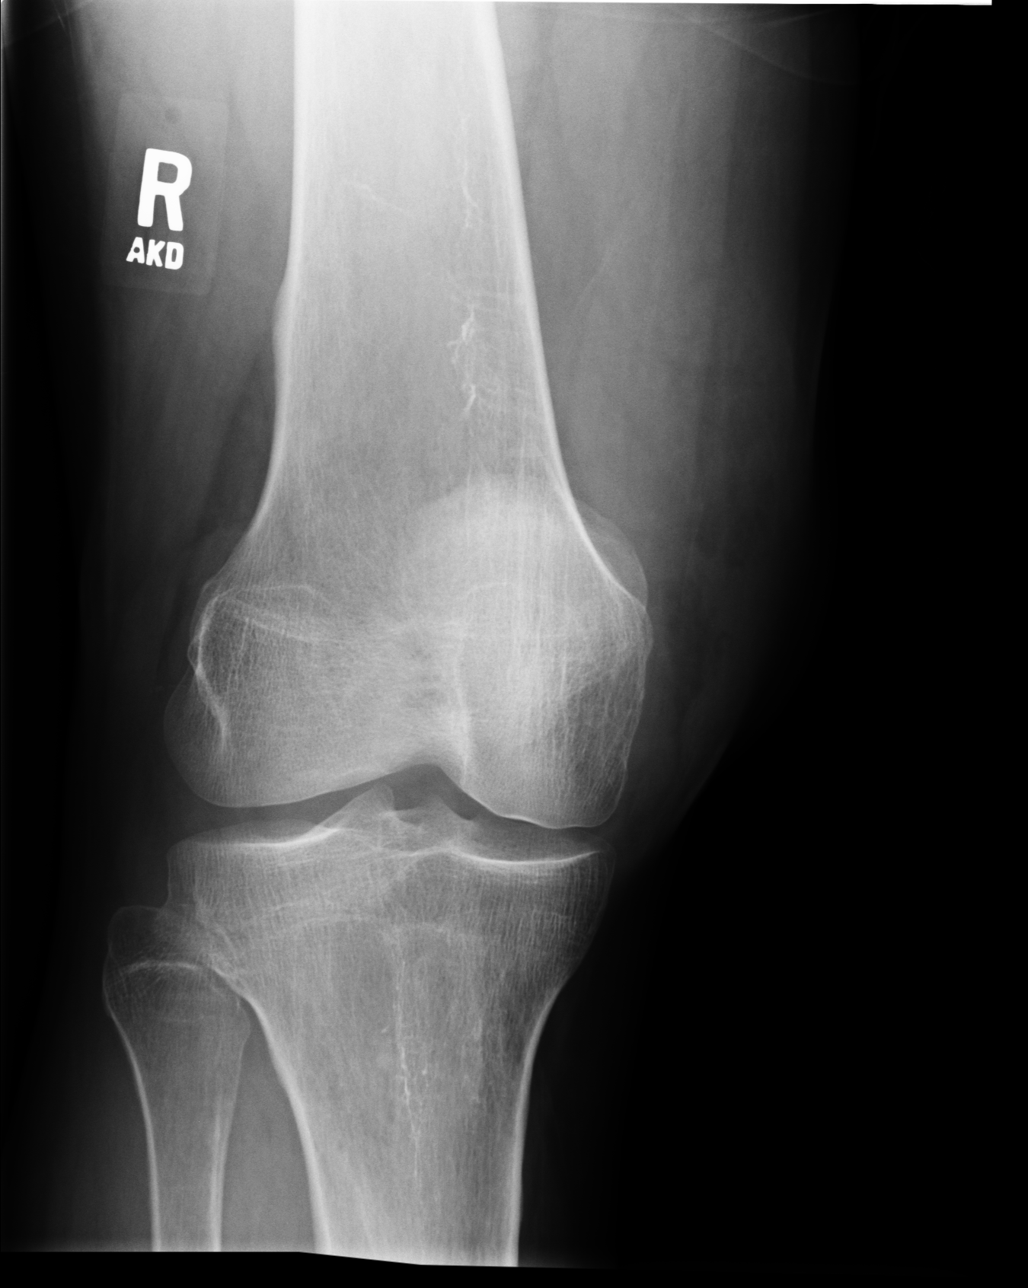
[im 4/4]
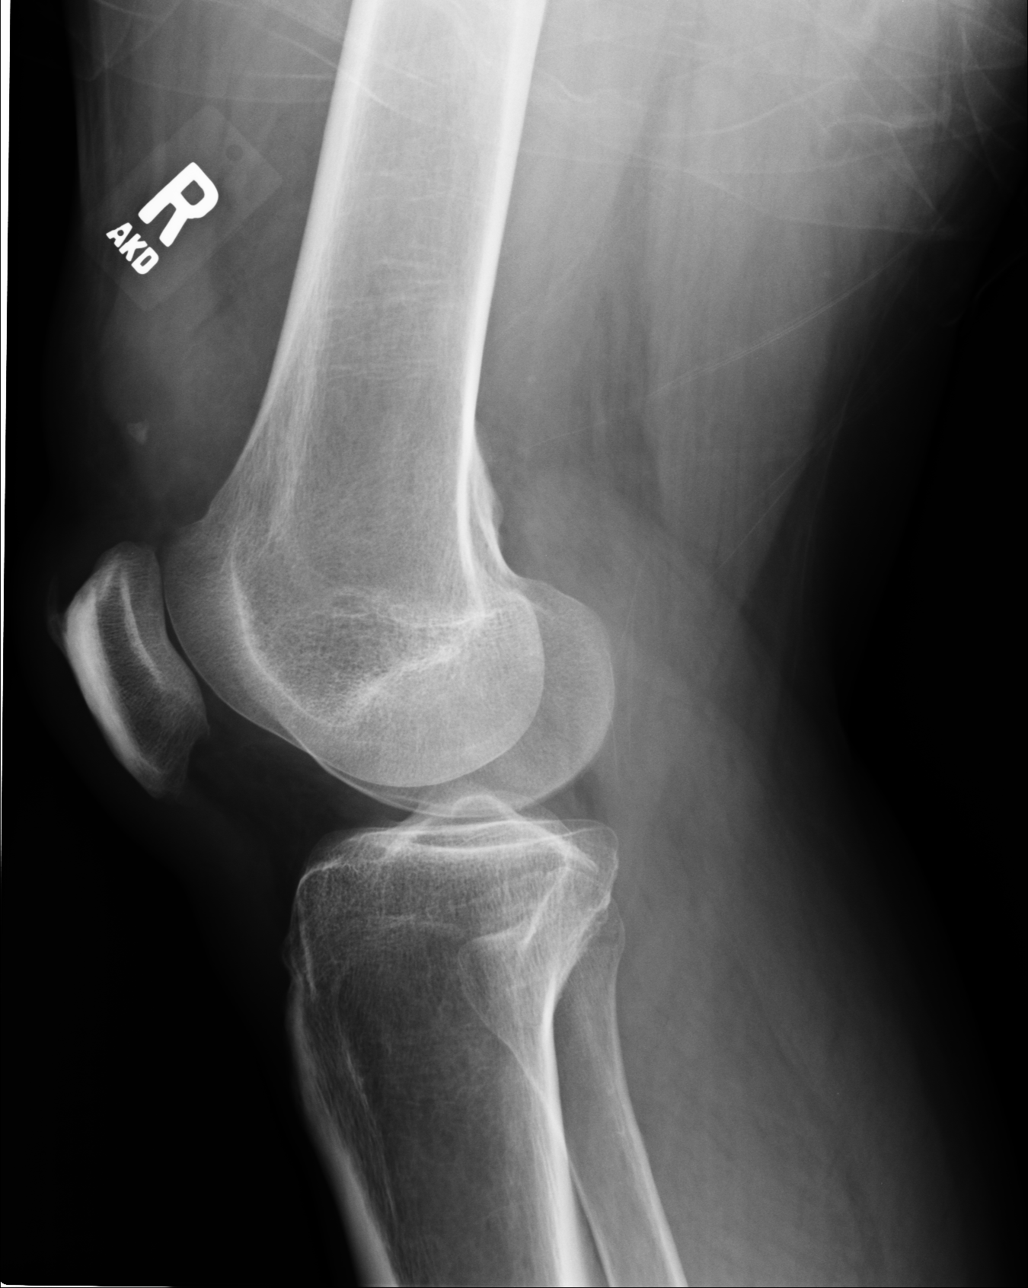

[4 of 4 positions shown; findings below may reference images not displayed]

PROCEDURE:     DXR - DXR KNEE RT COMP WITH OBLIQUES  - [DATE]  [DATE]

RESULT:     Four views of the right knee are submitted. The bones appear
reasonably well mineralized for age. There is beaking of the tibial spines.
The patella appears to be normal in position. Small osteophytes are noted
from the superior and inferior margins of the patella. I see no fractures of
the tibia or fibula or of femur. There is increased soft tissue density just
above the patella. Reportedly clinically there is a defect in the expected
location of the quadriceps tendon.
IMPRESSION: 1. I do not see evidence of acute fracture nor dislocation of the right knee.
2. There is increased soft tissue density in the suprapatellar region which
may be related to a joint effusion or to tendinous injury. The patella
remains normally positioned.

## 2010-09-21 ENCOUNTER — Emergency Department: Payer: Self-pay | Admitting: *Deleted

## 2010-09-21 IMAGING — CR DG ABDOMEN 3V
1 series · 6 of 6 positions shown · non-contrast
Comparison: none

REASON FOR EXAM: Pain, vomiting
COMMENTS:   May transport without cardiac monitor

PROCEDURE:     DXR - DXR ABDOMEN 3-WAY (INCL PA CXR)  - [DATE]  [DATE]
RESULT:     Comparisons:  None

[Series 1: view not recorded · 0.17mm/px · 6 of 6 slices shown]
[im 1/6]
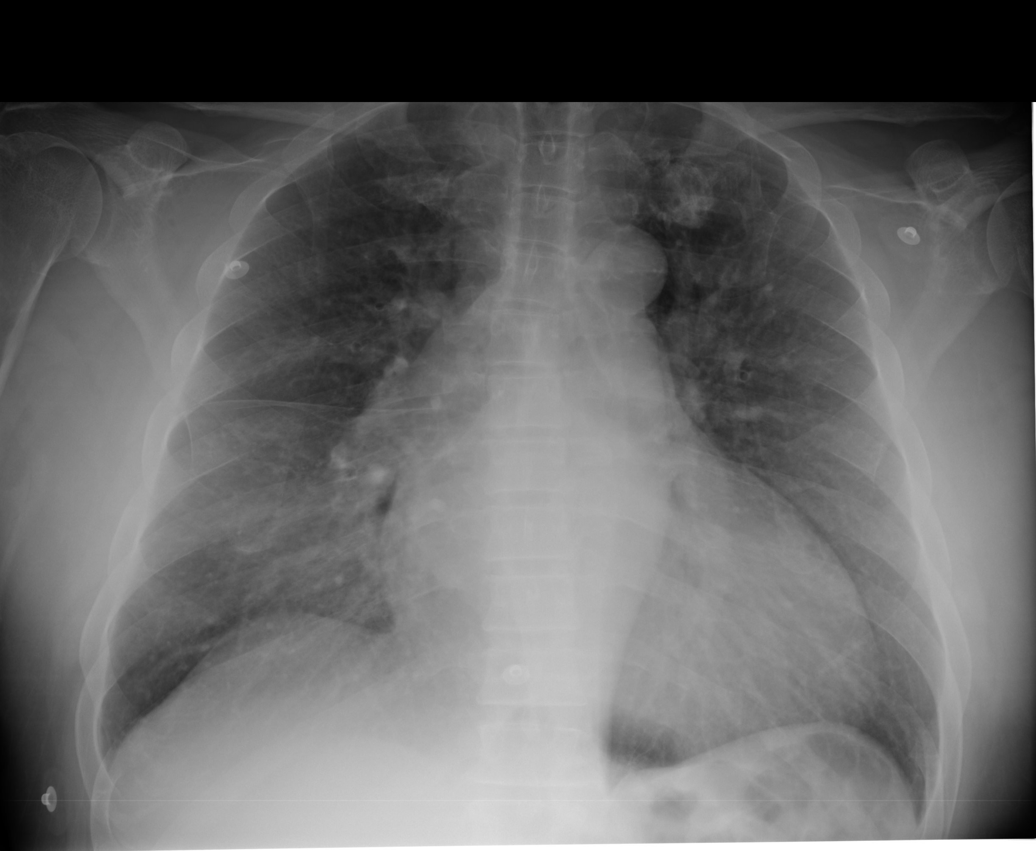
[im 2/6]
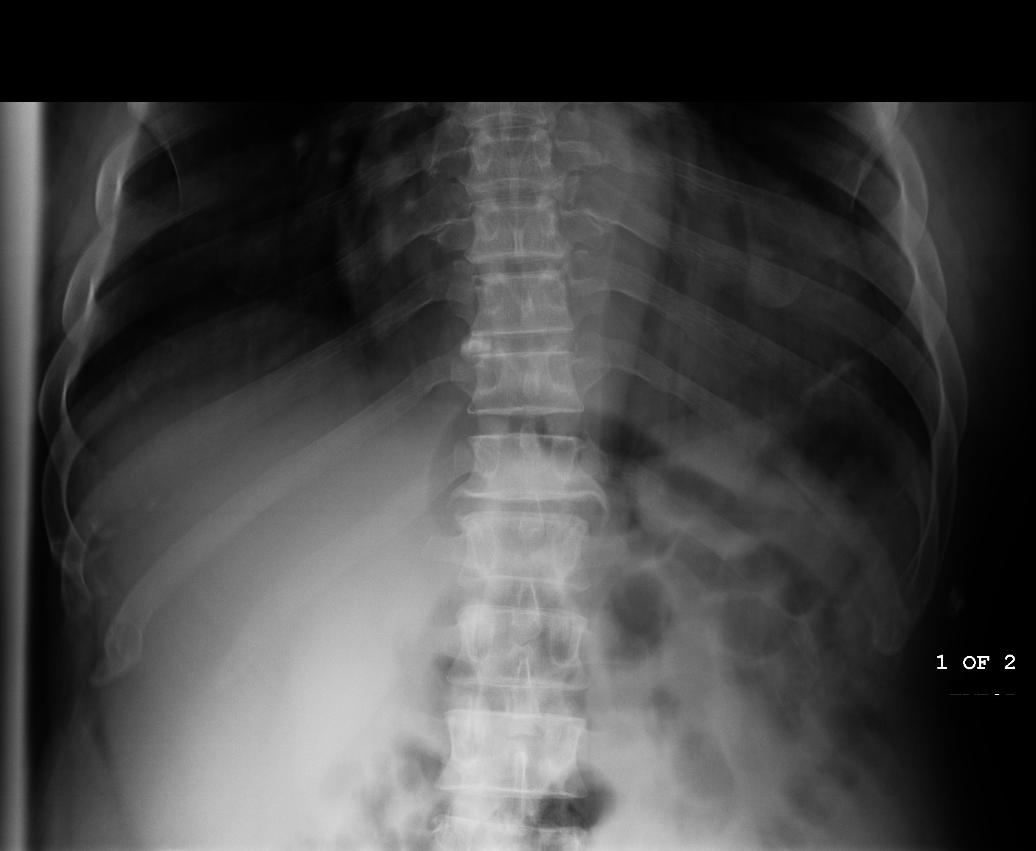
[im 3/6]
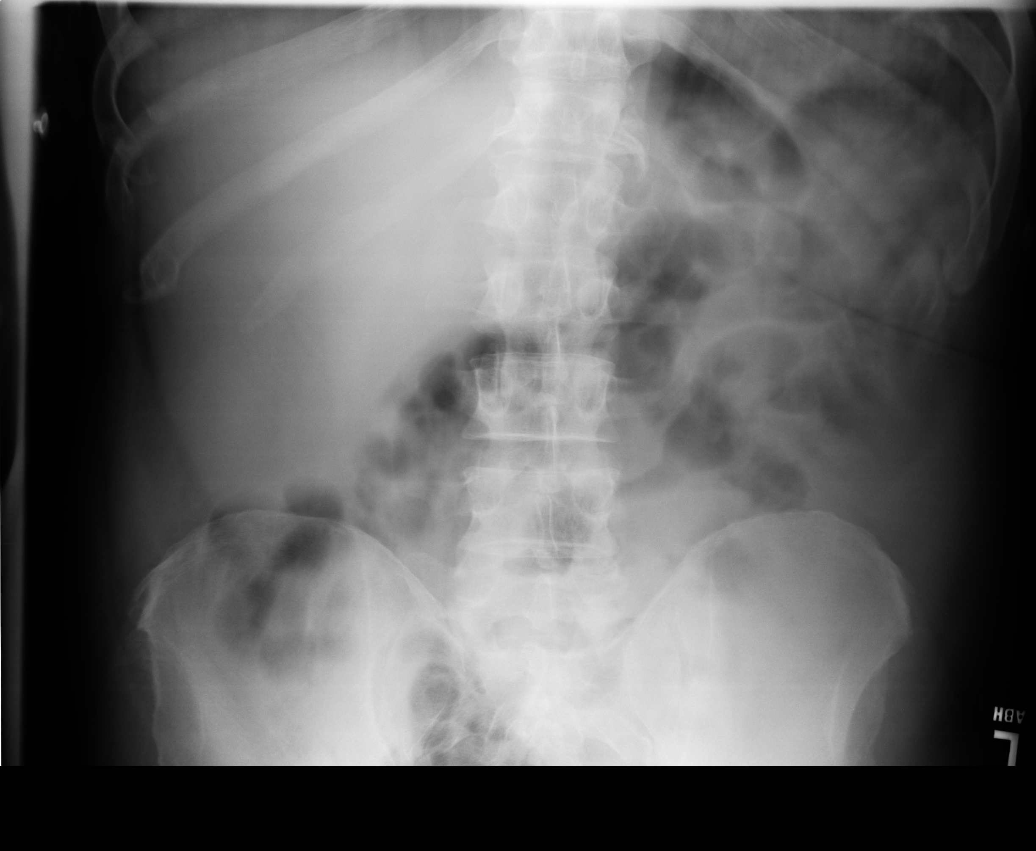
[im 4/6]
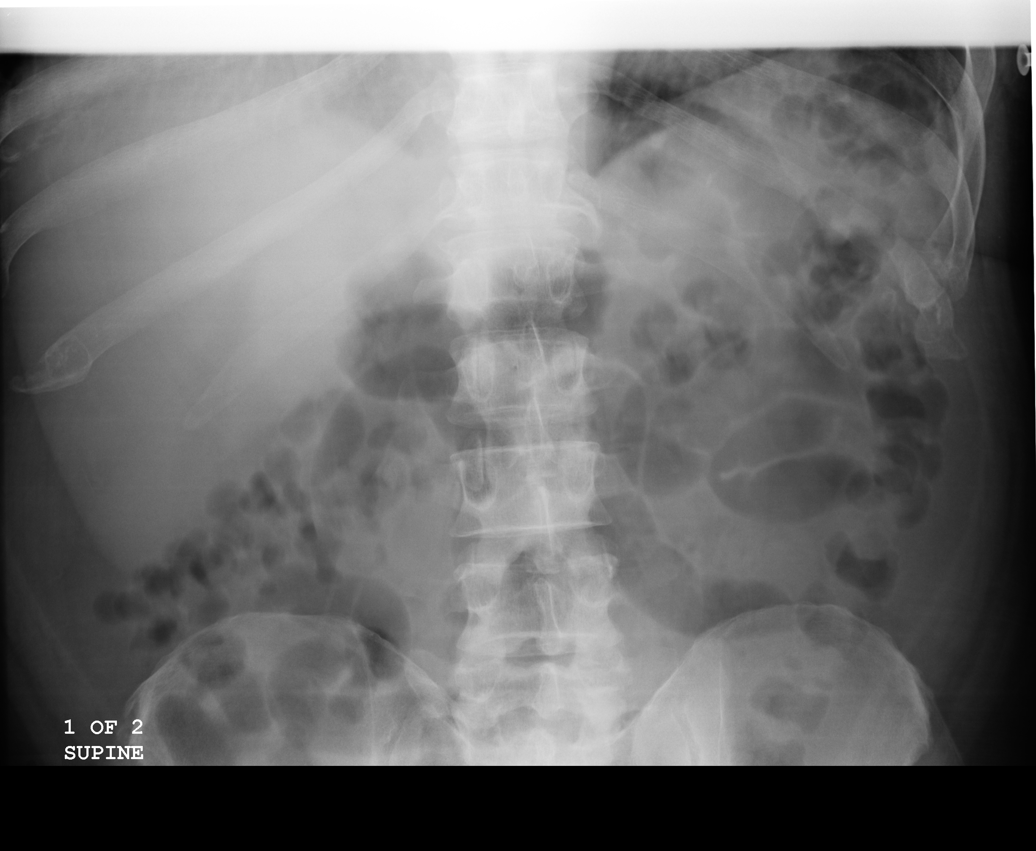
[im 5/6]
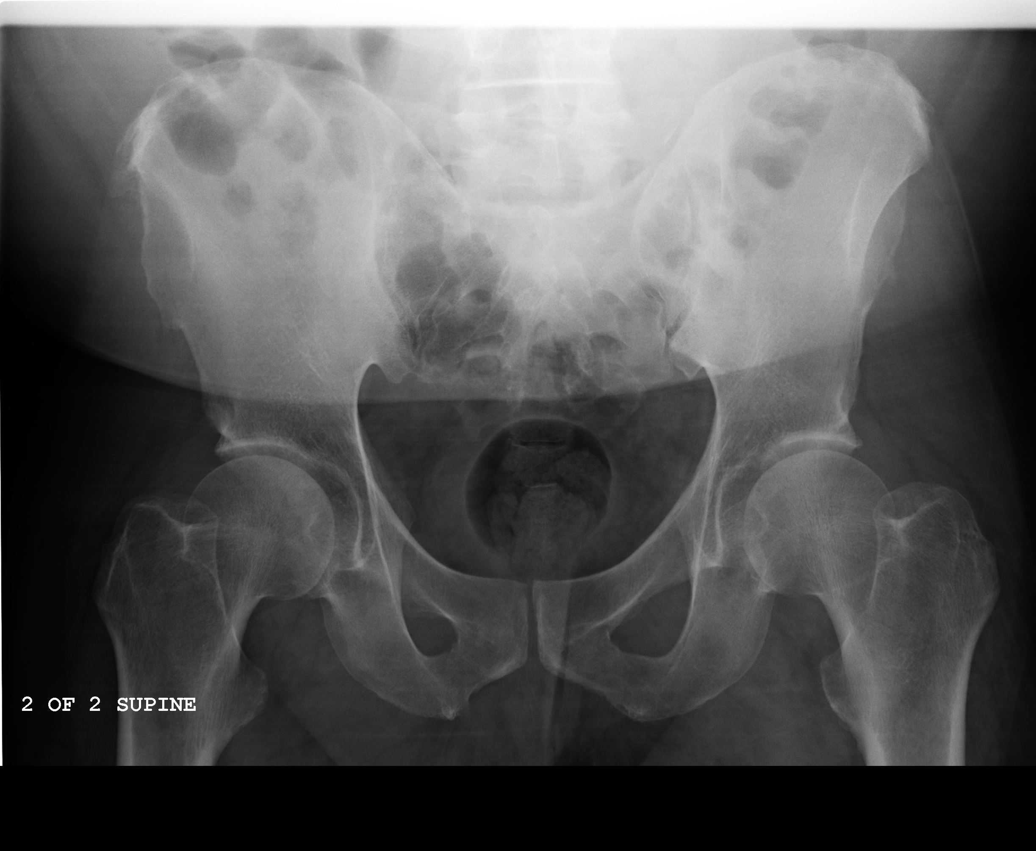
[im 6/6]
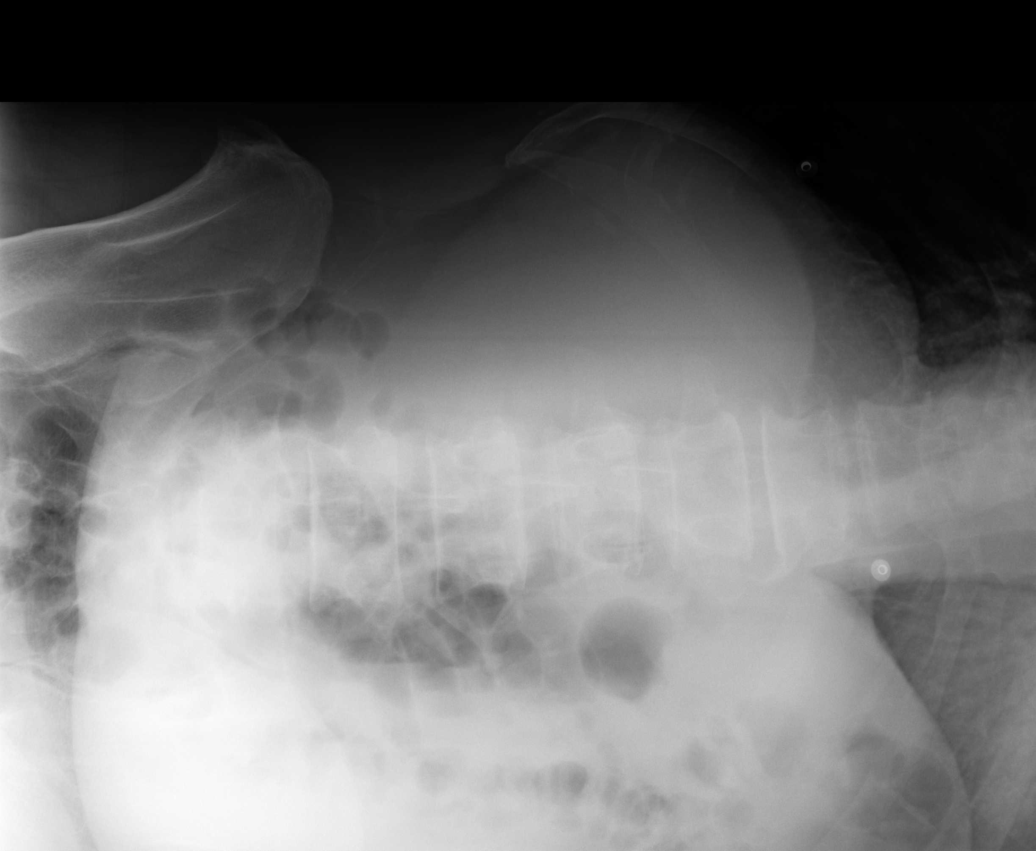

[6 of 6 positions shown; findings below may reference images not displayed]

FINDINGS: PA chest and supine, upright, and left lateral decubitus views of the
abdomen are provided.

There is bilateral diffuse interstitial thickening which may reflect mild
pulmonary edema. The heart size is enlarged.

There is a nonspecific bowel gas pattern. There is no bowel dilatation to
suggest obstruction. There are no air-fluid levels. There are no dilated
loops, bowel wall thickening, or evidence of mass effect.  Soft tissue
shadows are normal. There is no evidence of pneumoperitoneum, portal venous
gas, or pneumatosis.

Osseous structures are unremarkable.
IMPRESSION: Findings concerning for mild pulmonary edema. Otherwise unremarkable
abdominal series.

## 2010-09-23 ENCOUNTER — Inpatient Hospital Stay: Payer: Self-pay | Admitting: Internal Medicine

## 2010-09-23 IMAGING — CR DG CHEST 1V PORT
1 series · 1 of 1 positions shown · non-contrast
Comparison: none

REASON FOR EXAM: cp
COMMENTS:

[view not recorded]
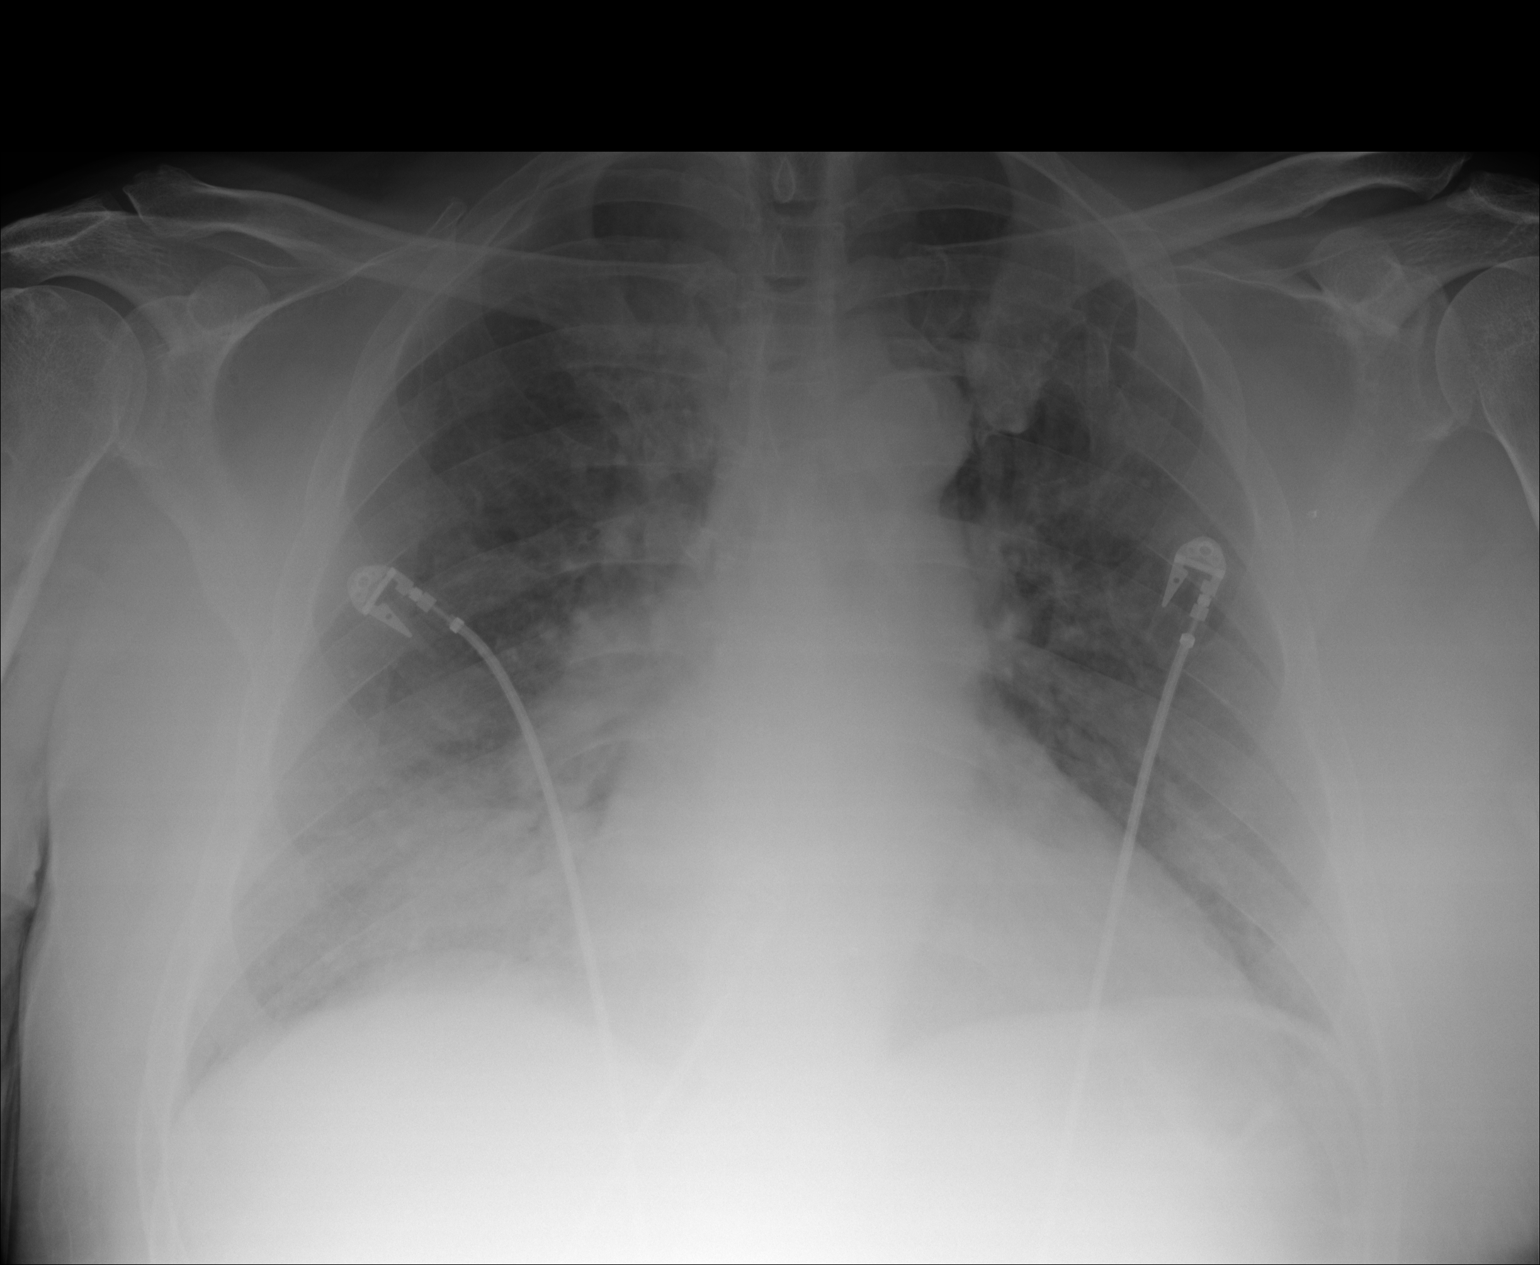

[1 of 1 positions shown; findings below may reference images not displayed]

PROCEDURE:     DXR - DXR PORTABLE CHEST SINGLE VIEW  - [DATE]  [DATE]

RESULT:     Cardiomegaly is present with pulmonary vascular prominence and
interstitial prominence. These findings are consistent with congestive heart
failure. These findings are more pronounced than on prior chest x-ray of
[DATE].
IMPRESSION: Congestive heart failure with pulmonary interstitial edema.

## 2010-11-30 ENCOUNTER — Inpatient Hospital Stay: Payer: Self-pay | Admitting: Internal Medicine

## 2010-11-30 IMAGING — CT CT ABD-PELV W/O CM
1 of 2 series · 15 of 32 positions shown, 19 images · non-contrast
Comparison: none

REASON FOR EXAM: (1) abd pain, vomiting can't keep down fluids he says;
(2) abd pain
COMMENTS:

[Series 2: 3mm soft tissue · axial · 0.85mm/px · z∈[-508,-52]mm · 15 of 166 slices shown, 19 images]
[im 7/166  soft-tissue]
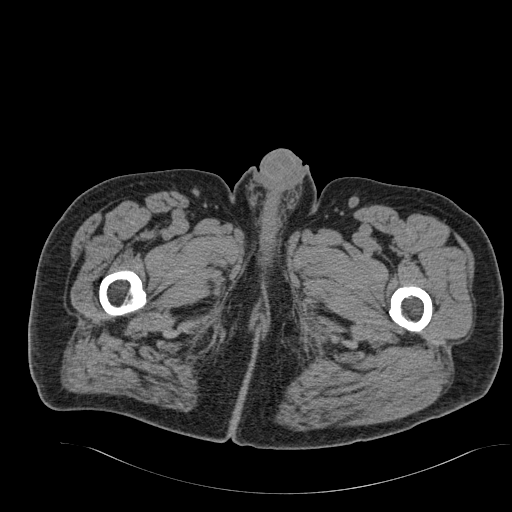
[im 7/166  bone]
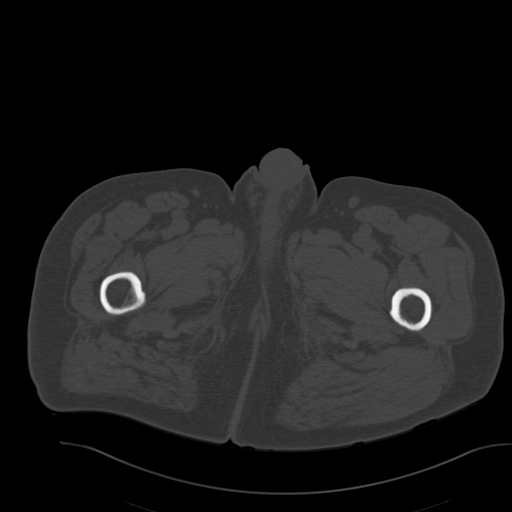
[im 20/166  soft-tissue]
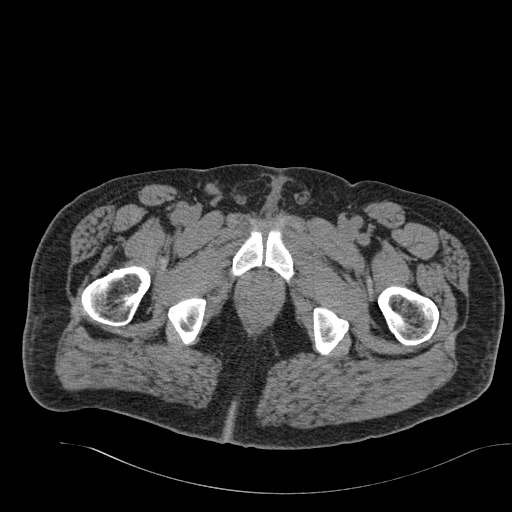
[im 34/166  soft-tissue]
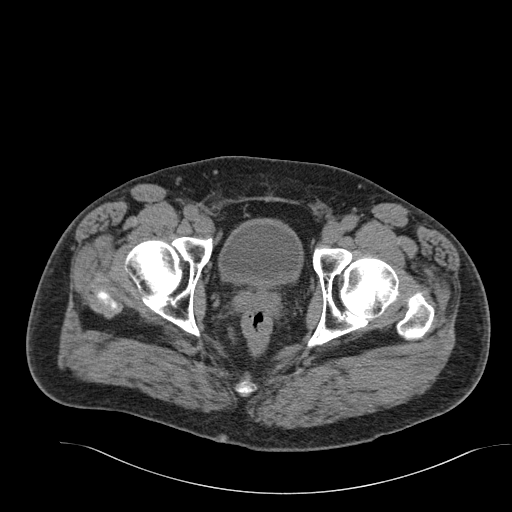
[im 47/166  soft-tissue]
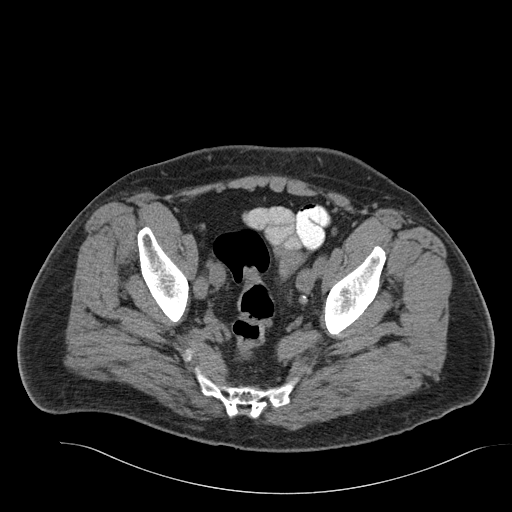
[im 60/166  soft-tissue]
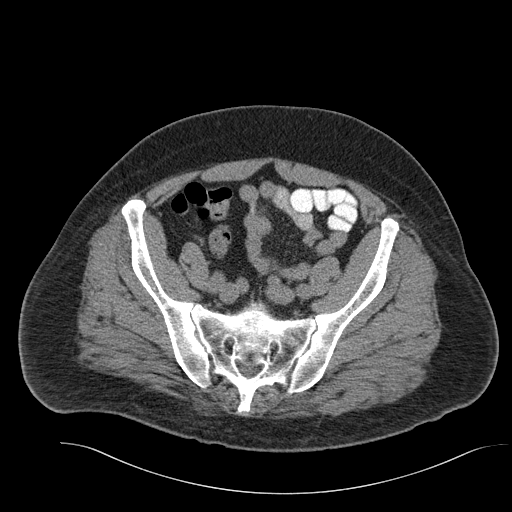
[im 73/166  soft-tissue]
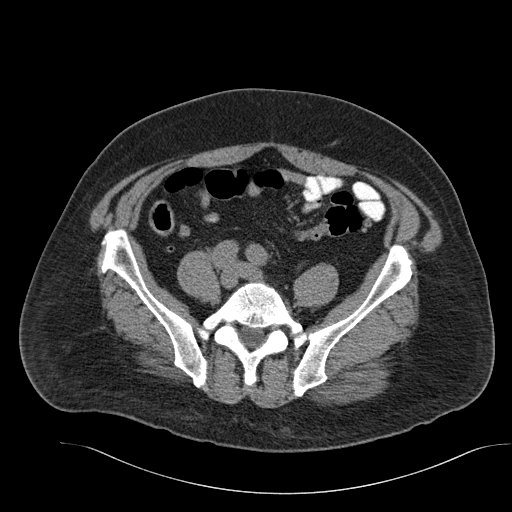
[im 86/166  soft-tissue]
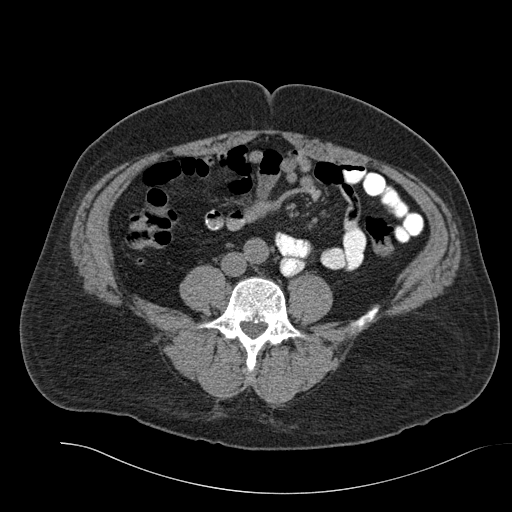
[im 93/166  soft-tissue]
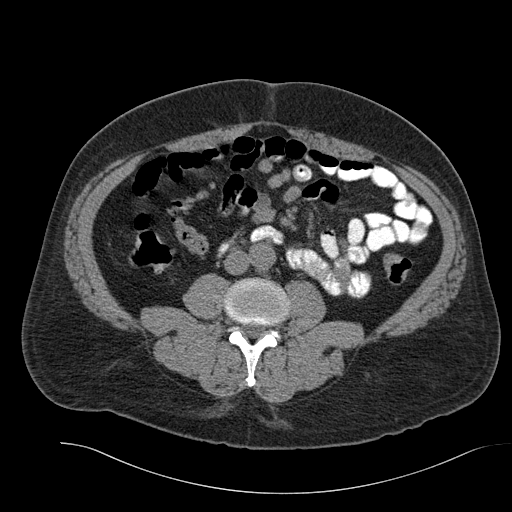
[im 106/166  soft-tissue]
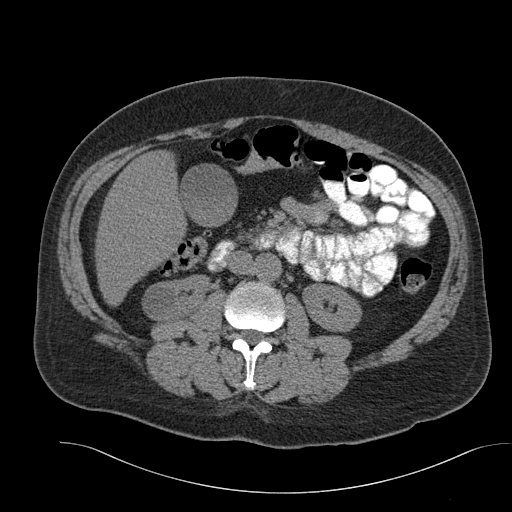
[im 106/166  bone]
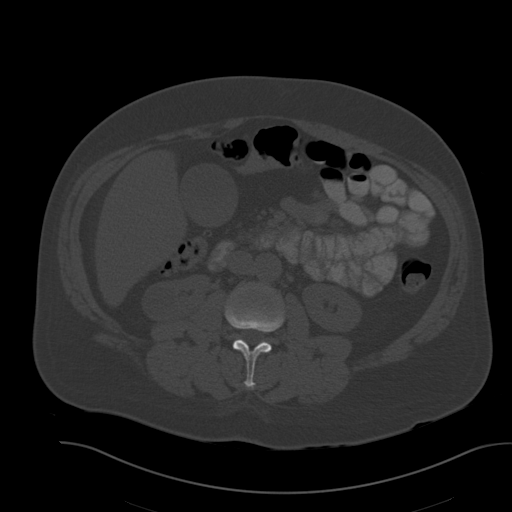
[im 119/166  soft-tissue]
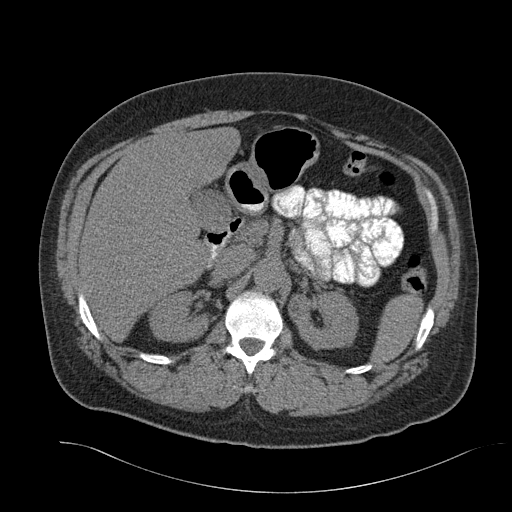
[im 133/166  soft-tissue]
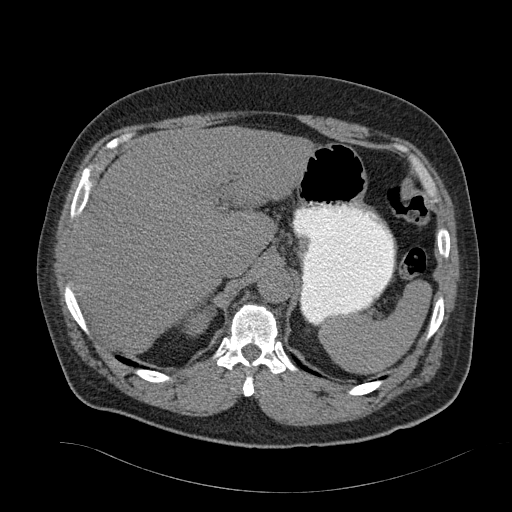
[im 139/166  lung]
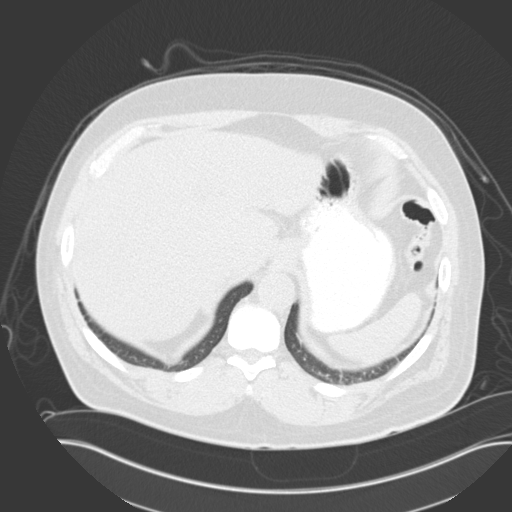
[im 146/166  soft-tissue]
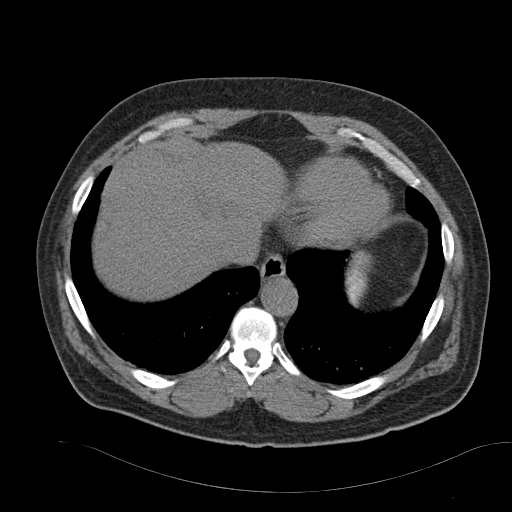
[im 146/166  lung]
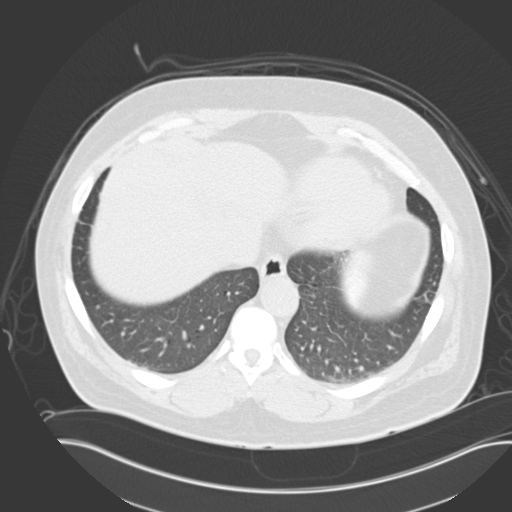
[im 152/166  lung]
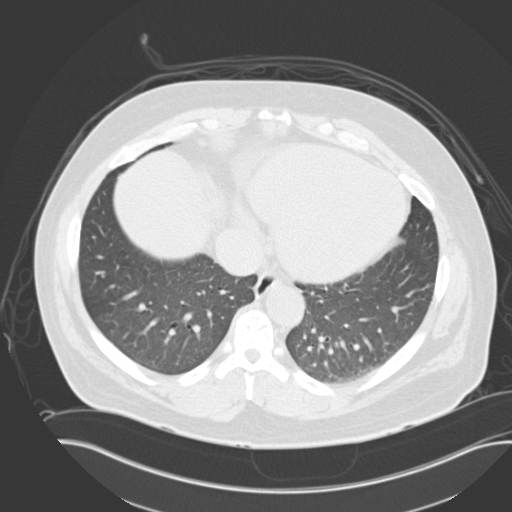
[im 159/166  soft-tissue]
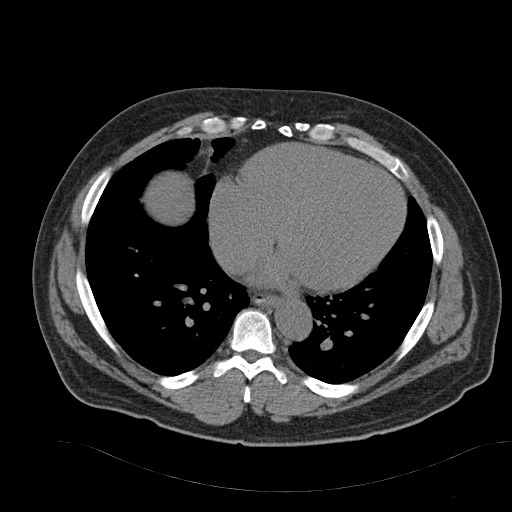
[im 159/166  lung]
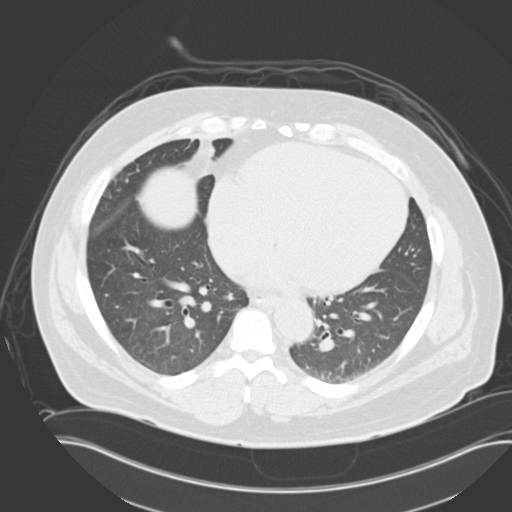

[15 of 32 positions shown; findings below may reference images not displayed]

PROCEDURE:     CT  - CT ABDOMEN AND PELVIS W[DATE]  [DATE]

RESULT:     Axial noncontrast CT scanning was performed through the abdomen
and pelvis at 3 mm intervals and slice thicknesses. Review of multiplanar
reconstructed images was performed separately on the VIA monitor. The
patient did ingest a small amount of oral contrast which lies in the stomach
and proximal and mid small bowel.

The stomach is moderately distended with contrast but does not appear
abnormal. The loops of jejunum and proximal ileum demonstrated exhibit no
evidence of obstruction. The partially gas end stool-filled colon exhibits
no evidence of obstruction. There is a normal-appearing appendix. The liver
exhibits no focal mass nor ductal dilation. The gallbladder is mildly
distended with no evidence of stones. The pancreas, spleen, adrenal glands,
and kidneys exhibit no acute abnormality. There are multiple hypodensities
associated with both kidneys most compatible with cysts. There is a
hypodensity associated with the medial aspect of the midpole of the left
kidney which is not classic for a simple cyst. This will merit elective
followup. The caliber of the abdominal aorta is normal. The urinary bladder
exhibits a prominent impression upon its base from the prostate gland. I see
no inguinal nor umbilical hernia. The lung bases are clear.
IMPRESSION: 1. I do not see evidence of ileus nor small or large bowel obstruction.
There is no evidence of acute appendicitis nor other forms of bowel
inflammation.
2. There is mild distention of the gallbladder without evidence of stones. I
see no acute abnormality of the liver.
3. There are multiple hypodensities in the kidneys which likely reflects
cysts. However, in the medial aspect the midpole of the left kidney there is
a more complex appearing structure with Hounsfield measurement of +28 which
is not classic for a simple cyst. Elective triphasic CT scanning of the
abdomen is recommended to further evaluate the kidneys.

## 2010-11-30 IMAGING — US ABDOMEN ULTRASOUND LIMITED
1 series · 17 of 25 positions shown · non-contrast
Comparison: none

REASON FOR EXAM: ruq pain n/v
COMMENTS:   Body Site: GB and Fossa, CBD, Head of Pancreas

[Series 1: abdomen ultrasound limited · 17 of 29 slices shown]
[im 1/29]
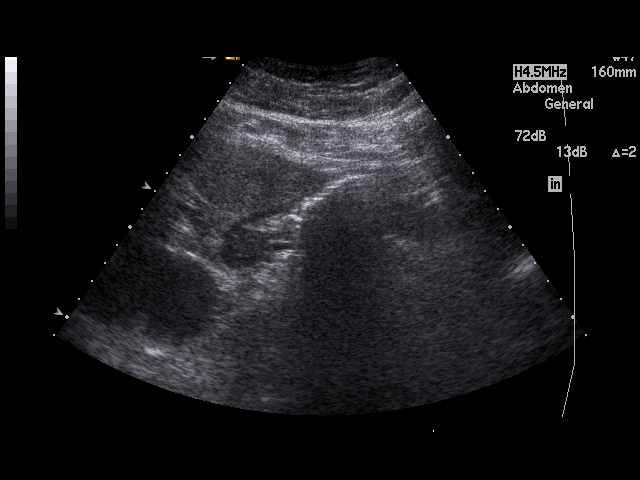
[im 3/29]
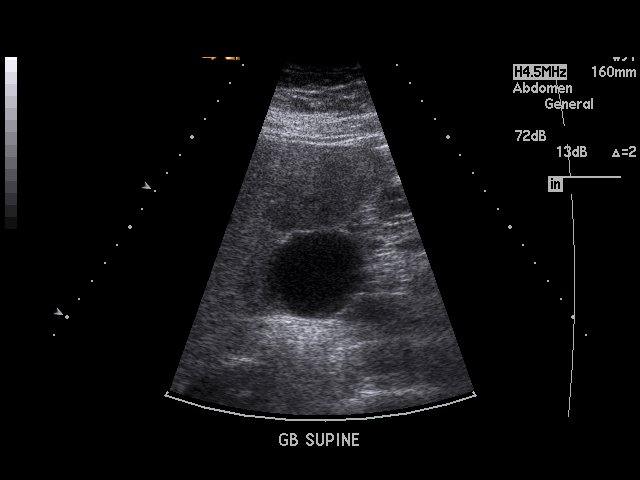
[im 4/29]
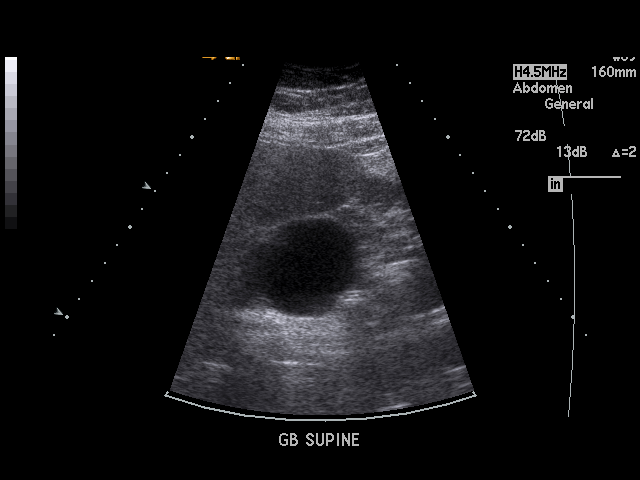
[im 6/29]
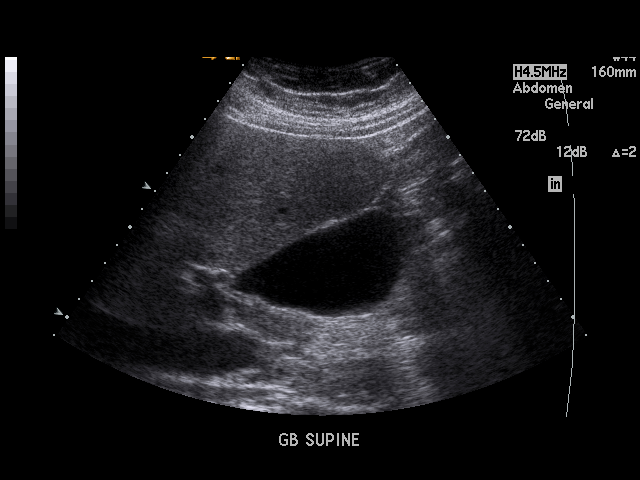
[im 8/29]
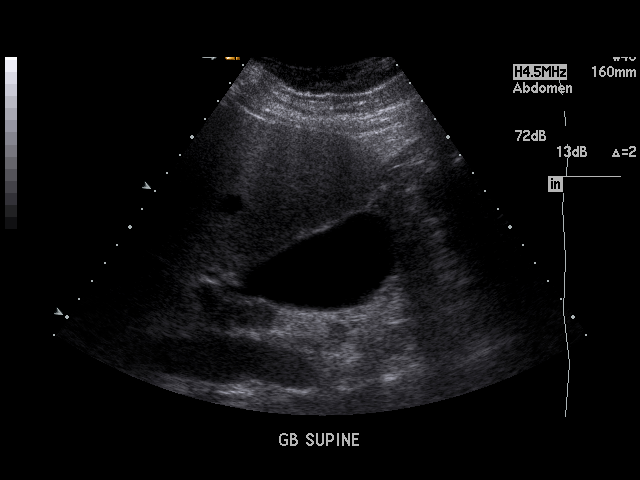
[im 10/29]
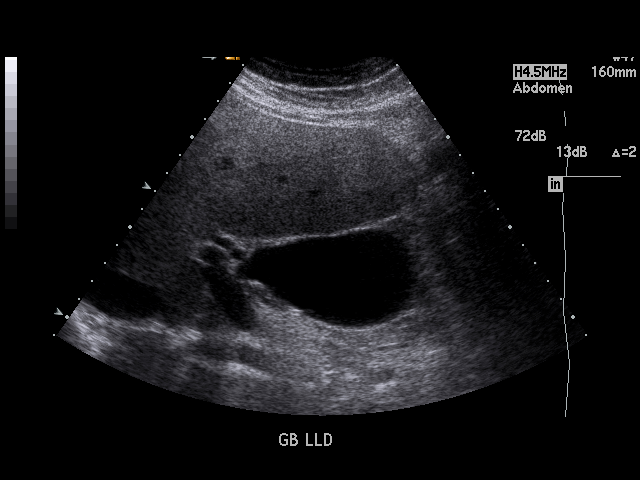
[im 11/29]
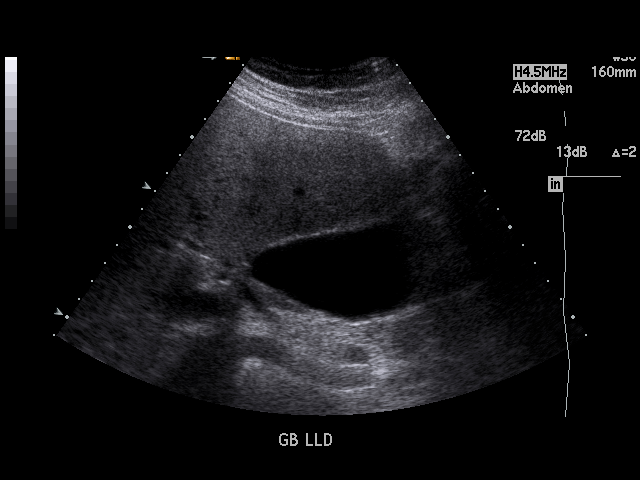
[im 13/29]
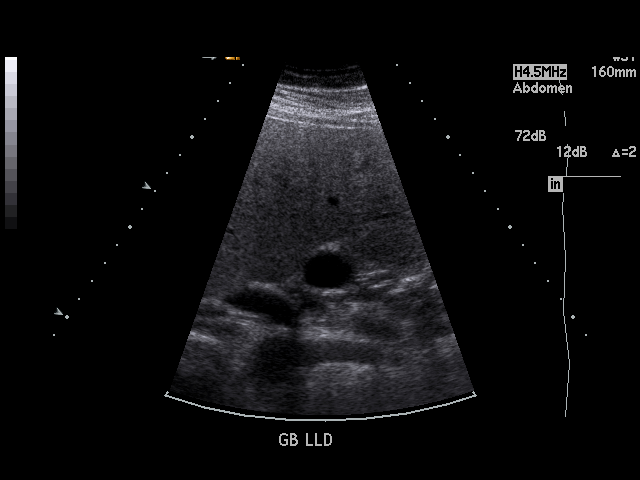
[im 15/29]
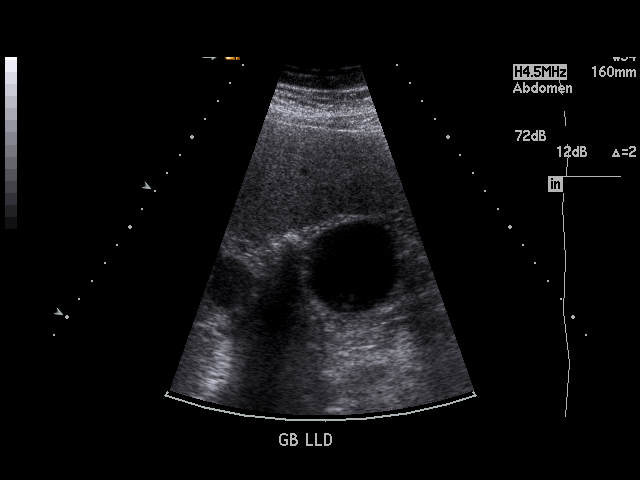
[im 16/29]
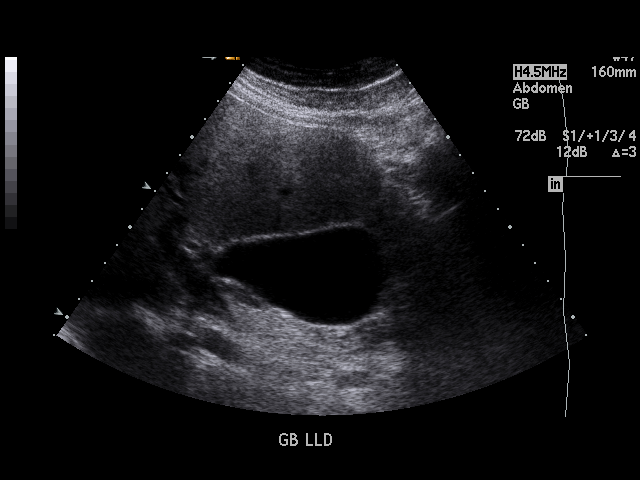
[im 18/29]
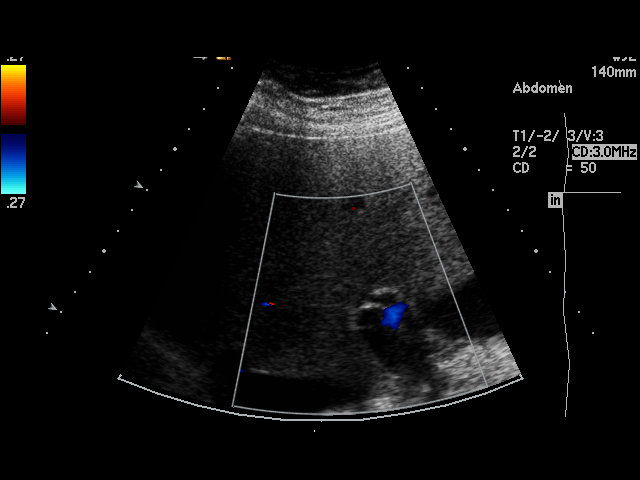
[im 19/29]
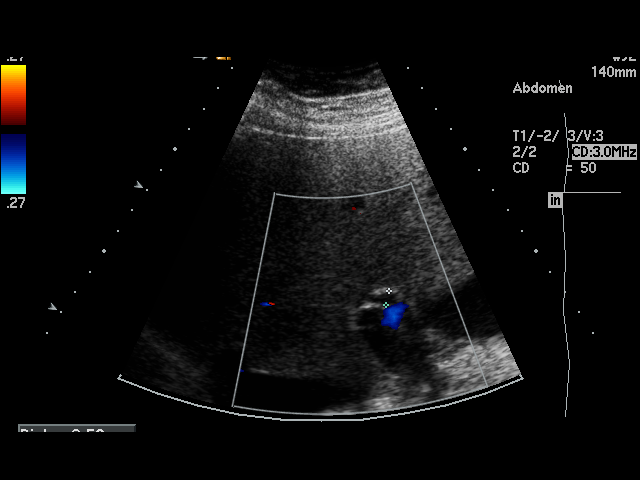
[im 22/29]
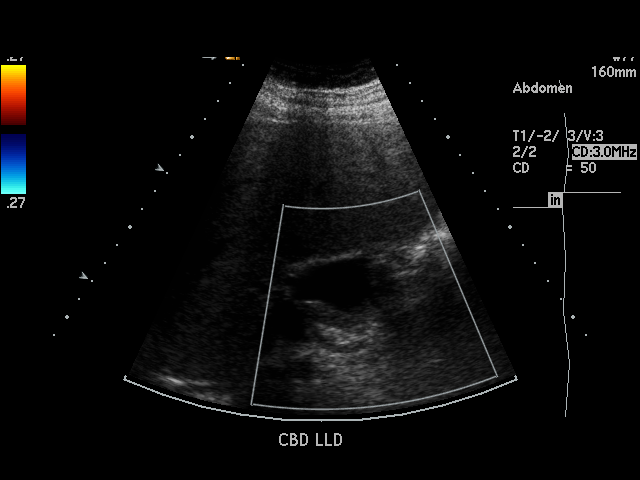
[im 23/29]
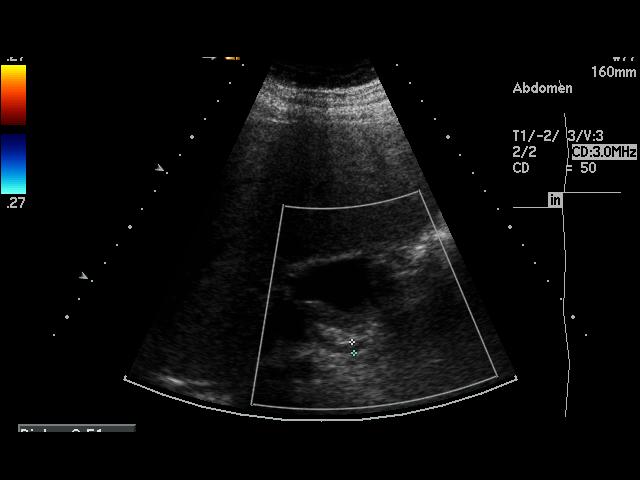
[im 25/29]
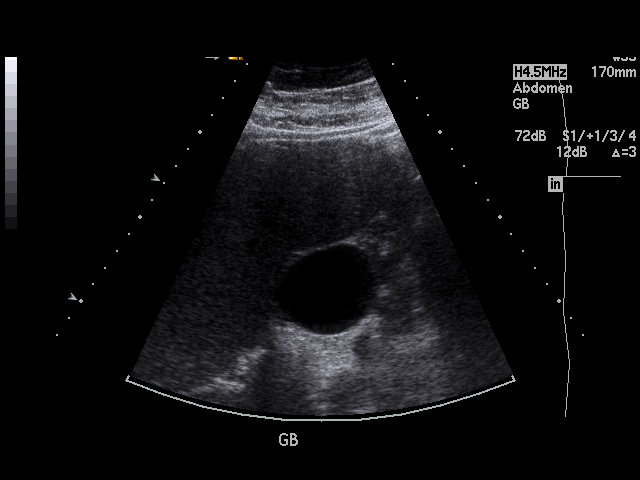
[im 26/29]
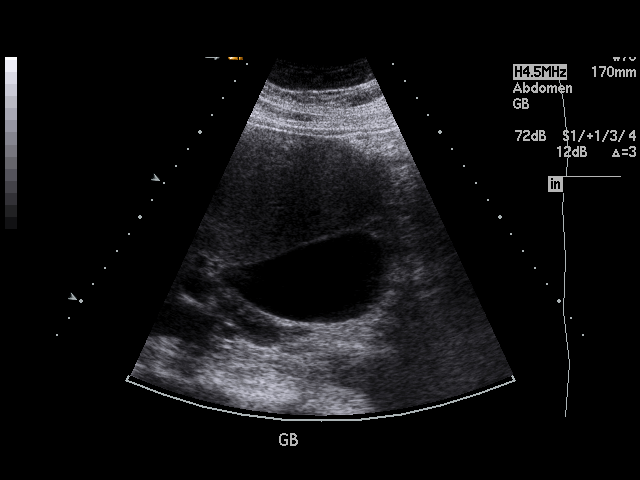
[im 29/29]
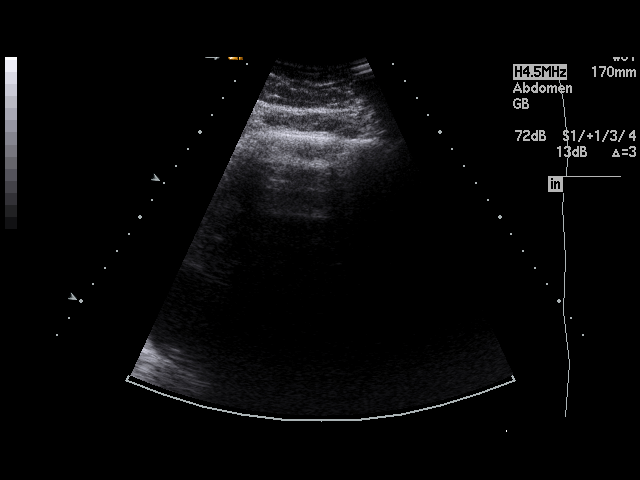

[17 of 25 positions shown; findings below may reference images not displayed]

PROCEDURE:     US  - US ABDOMEN LIMITED SURVEY  - [DATE]  [DATE]

RESULT:     The gallbladder is adequately distended with no evidence of
stones, wall thickening, or pericholecystic fluid. There is no positive
sonographic Murphy's sign. The common bile duct measures just under 6 mm in
diameter. The observed portions of the liver appear normal. The pancreas
could not be demonstrated due to the presence of bowel gas.
IMPRESSION: I see no acute abnormality of the gallbladder, visualized
portions of the liver, or the common bile duct. The pancreas could not be
demonstrated.

## 2010-12-01 IMAGING — CR DG CHEST 2V
1 series · 3 of 3 positions shown · non-contrast
Comparison: none

REASON FOR EXAM: chf
COMMENTS:

PROCEDURE:     DXR - DXR CHEST PA (OR AP) AND LATERAL  - [DATE]  [DATE]
RESULT:     Comparison: [DATE]

[Series 1: view not recorded · 0.17mm/px · 3 of 3 slices shown]
[im 1/3]
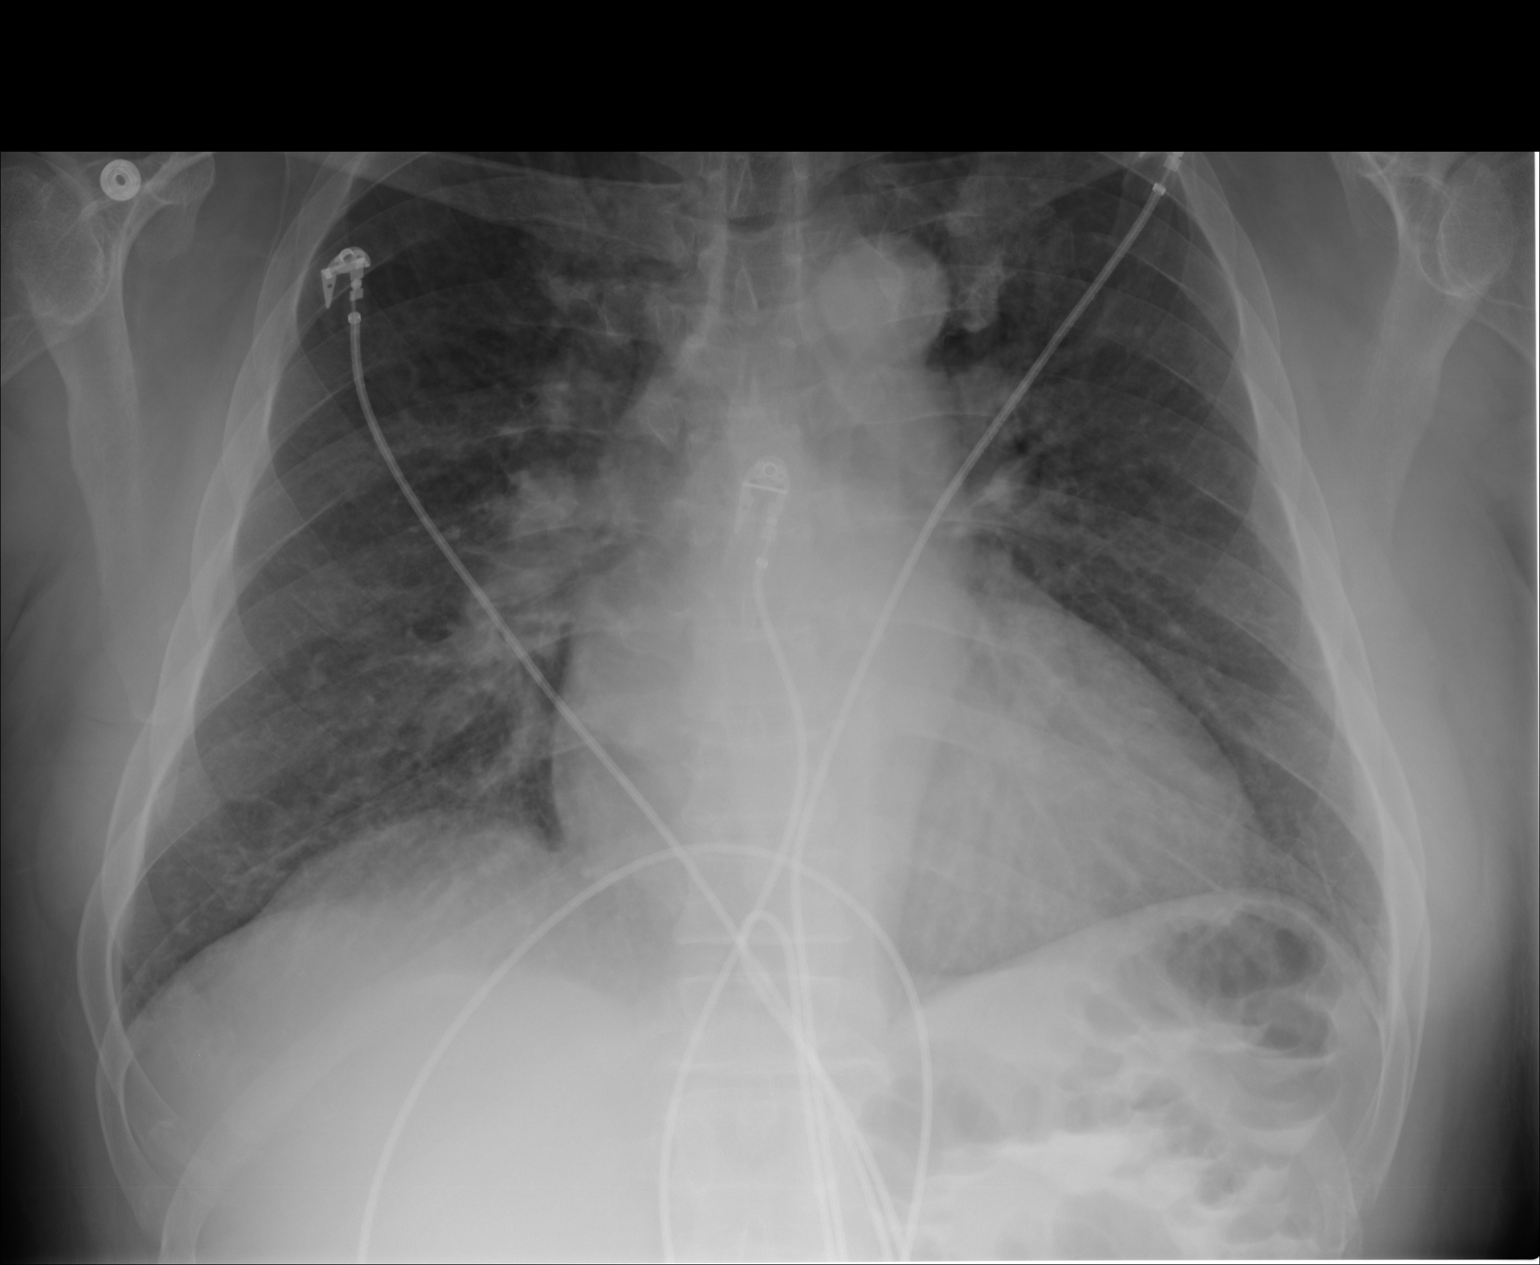
[im 2/3]
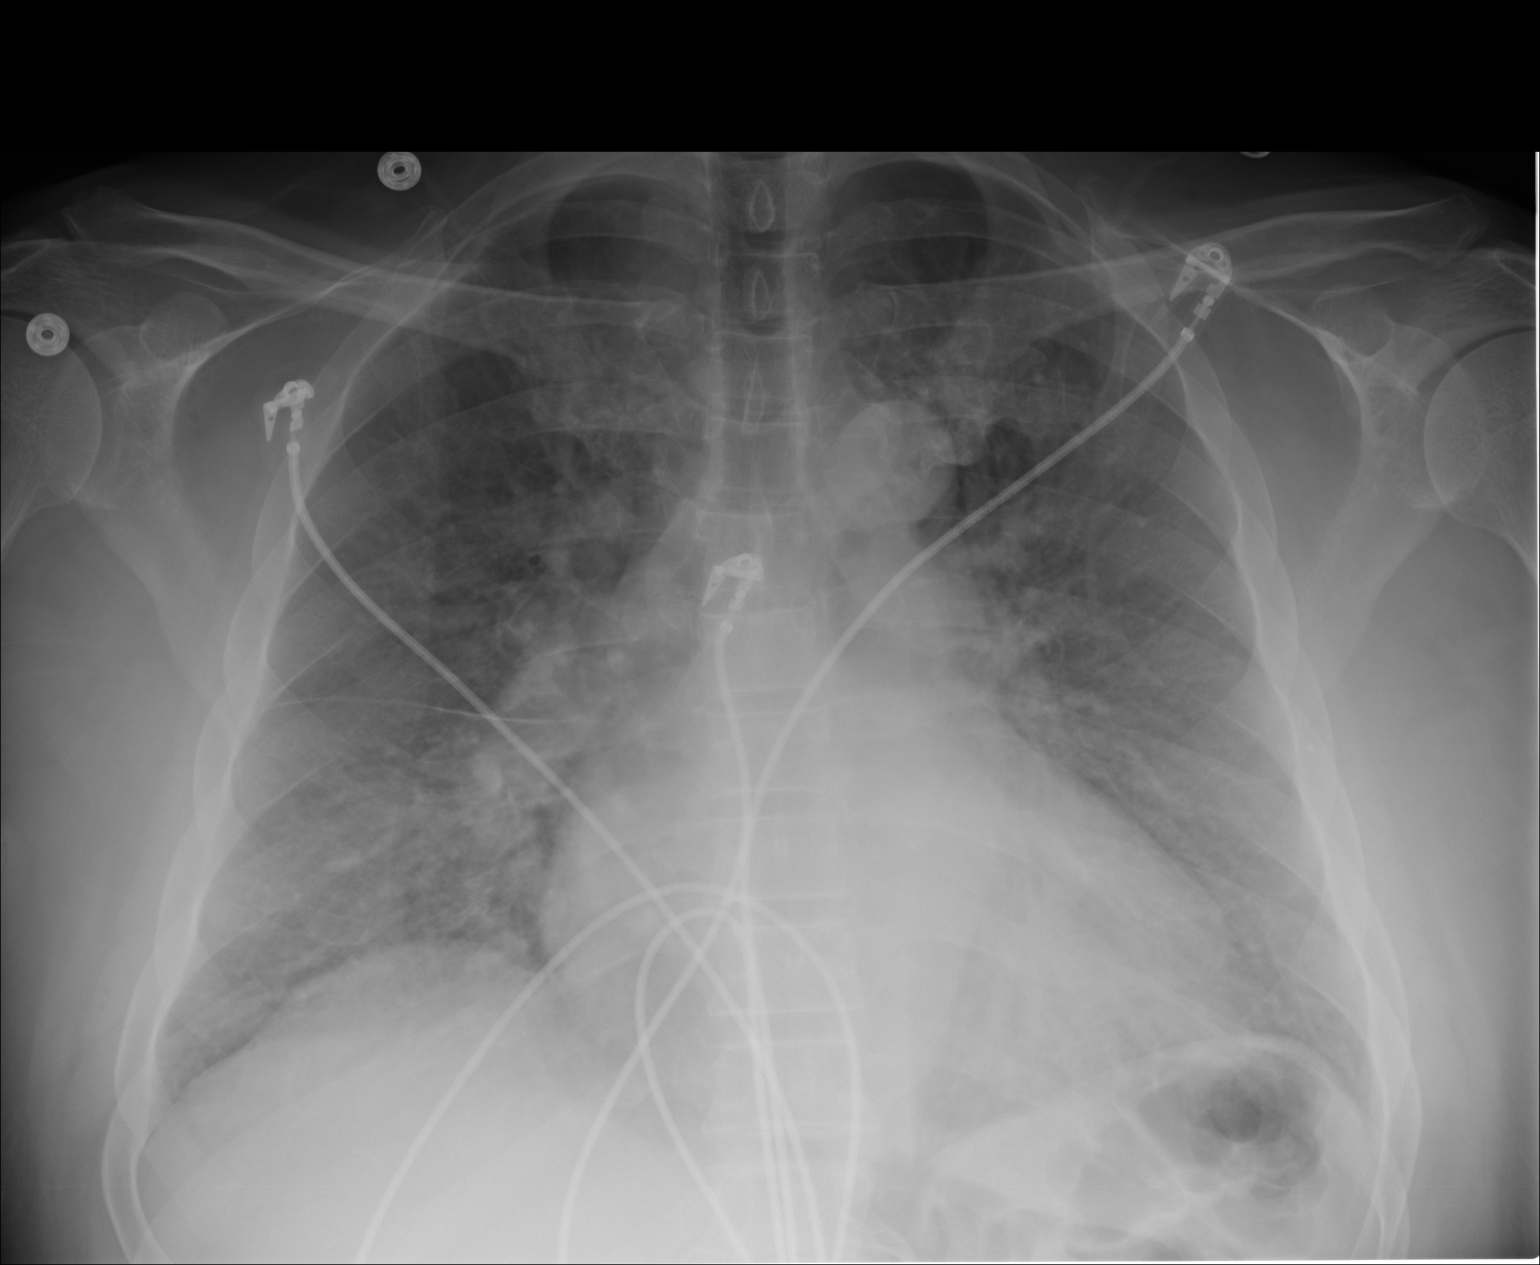
[im 3/3]
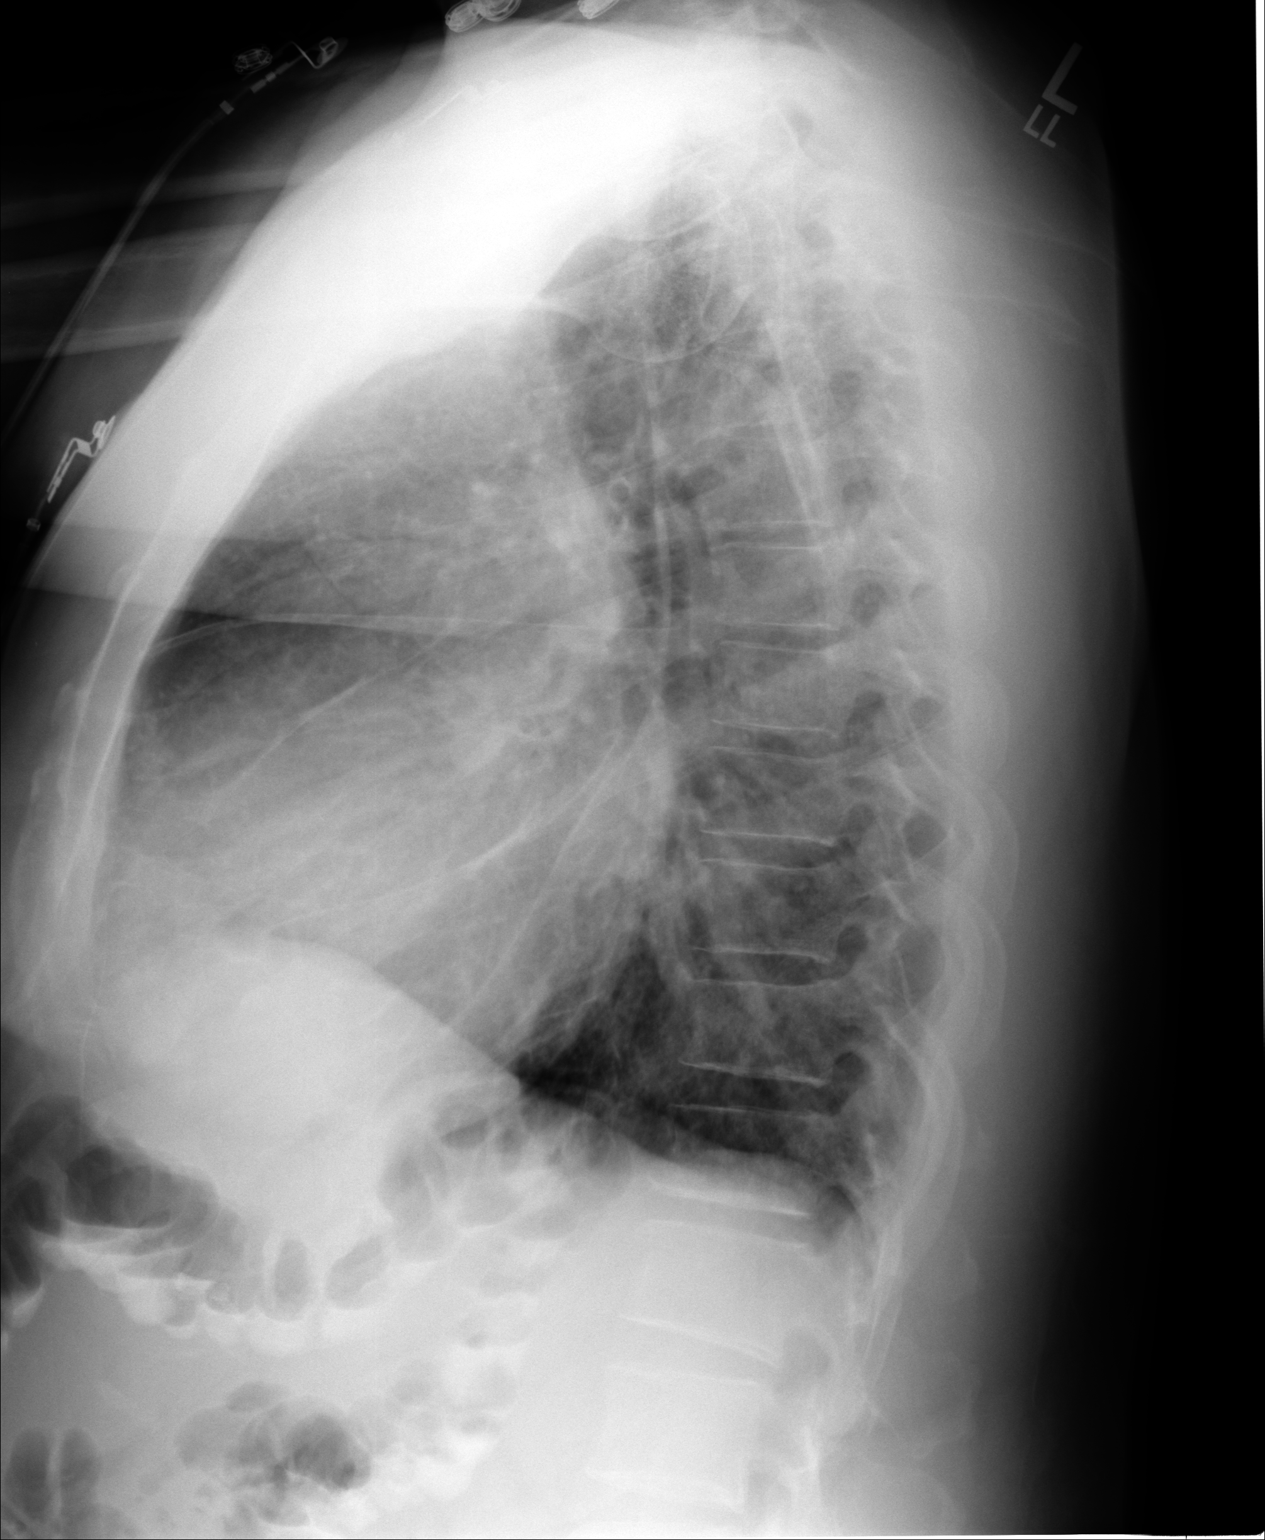

[3 of 3 positions shown; findings below may reference images not displayed]

FINDINGS: PA and lateral chest radiographs are provided. There is bilateral mild
interstitial thickening with prominence of the central pulmonary
vasculature. There is no focal parenchymal opacity, pleural effusion, or
pneumothorax. The heart size is enlarged..  The osseous structures are
unremarkable.
IMPRESSION: The overall findings are most concerning for mild pulmonary edema.

## 2010-12-03 IMAGING — CR DG CHEST 2V
1 series · 2 of 2 positions shown · non-contrast
Comparison: none

REASON FOR EXAM: f/u pul edema
COMMENTS:

PROCEDURE:     DXR - DXR CHEST PA (OR AP) AND LATERAL  - [DATE] [DATE]
RESULT:     Comparison: [DATE]

[Series 1: w chest pa · 0.14mm/px · 2 of 2 slices shown]
[im 1/2]
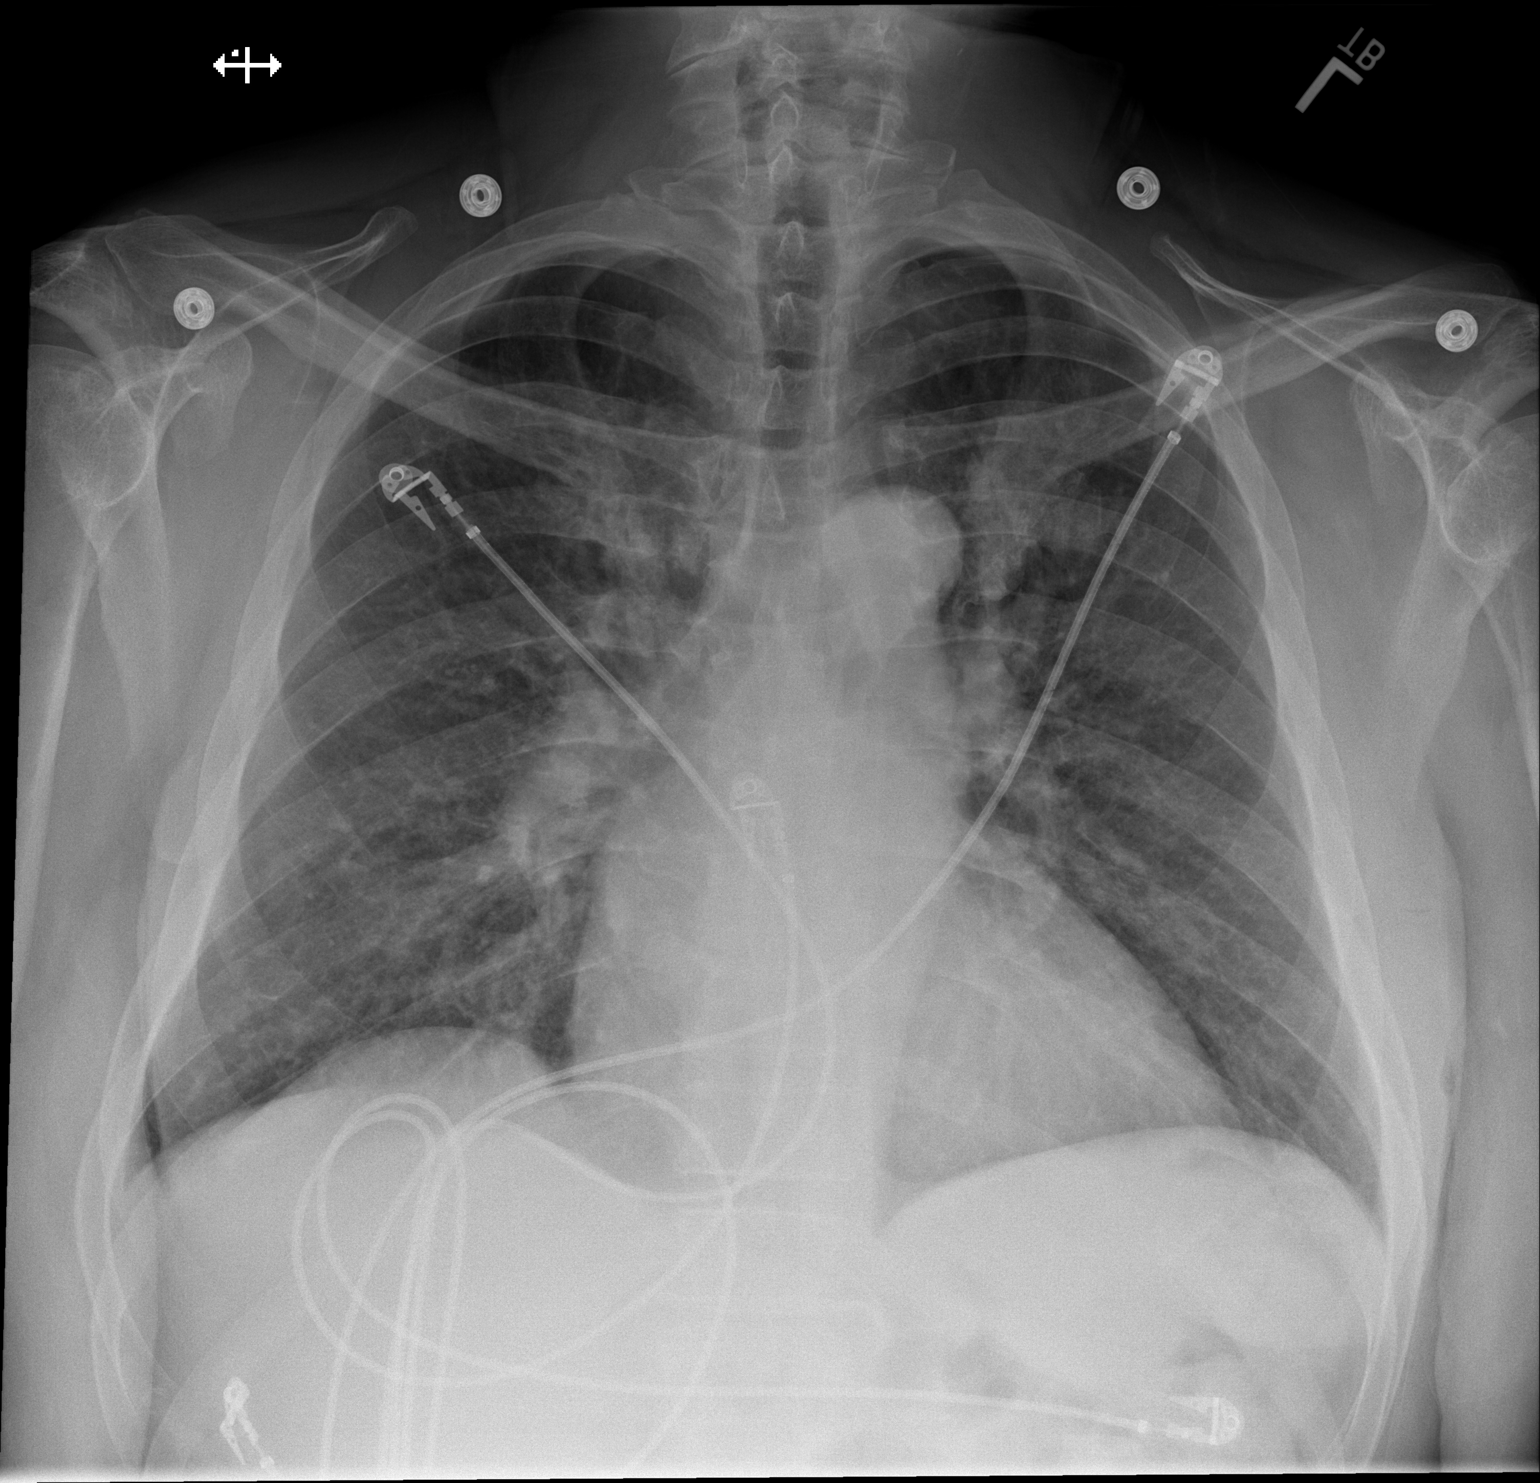
[im 2/2]
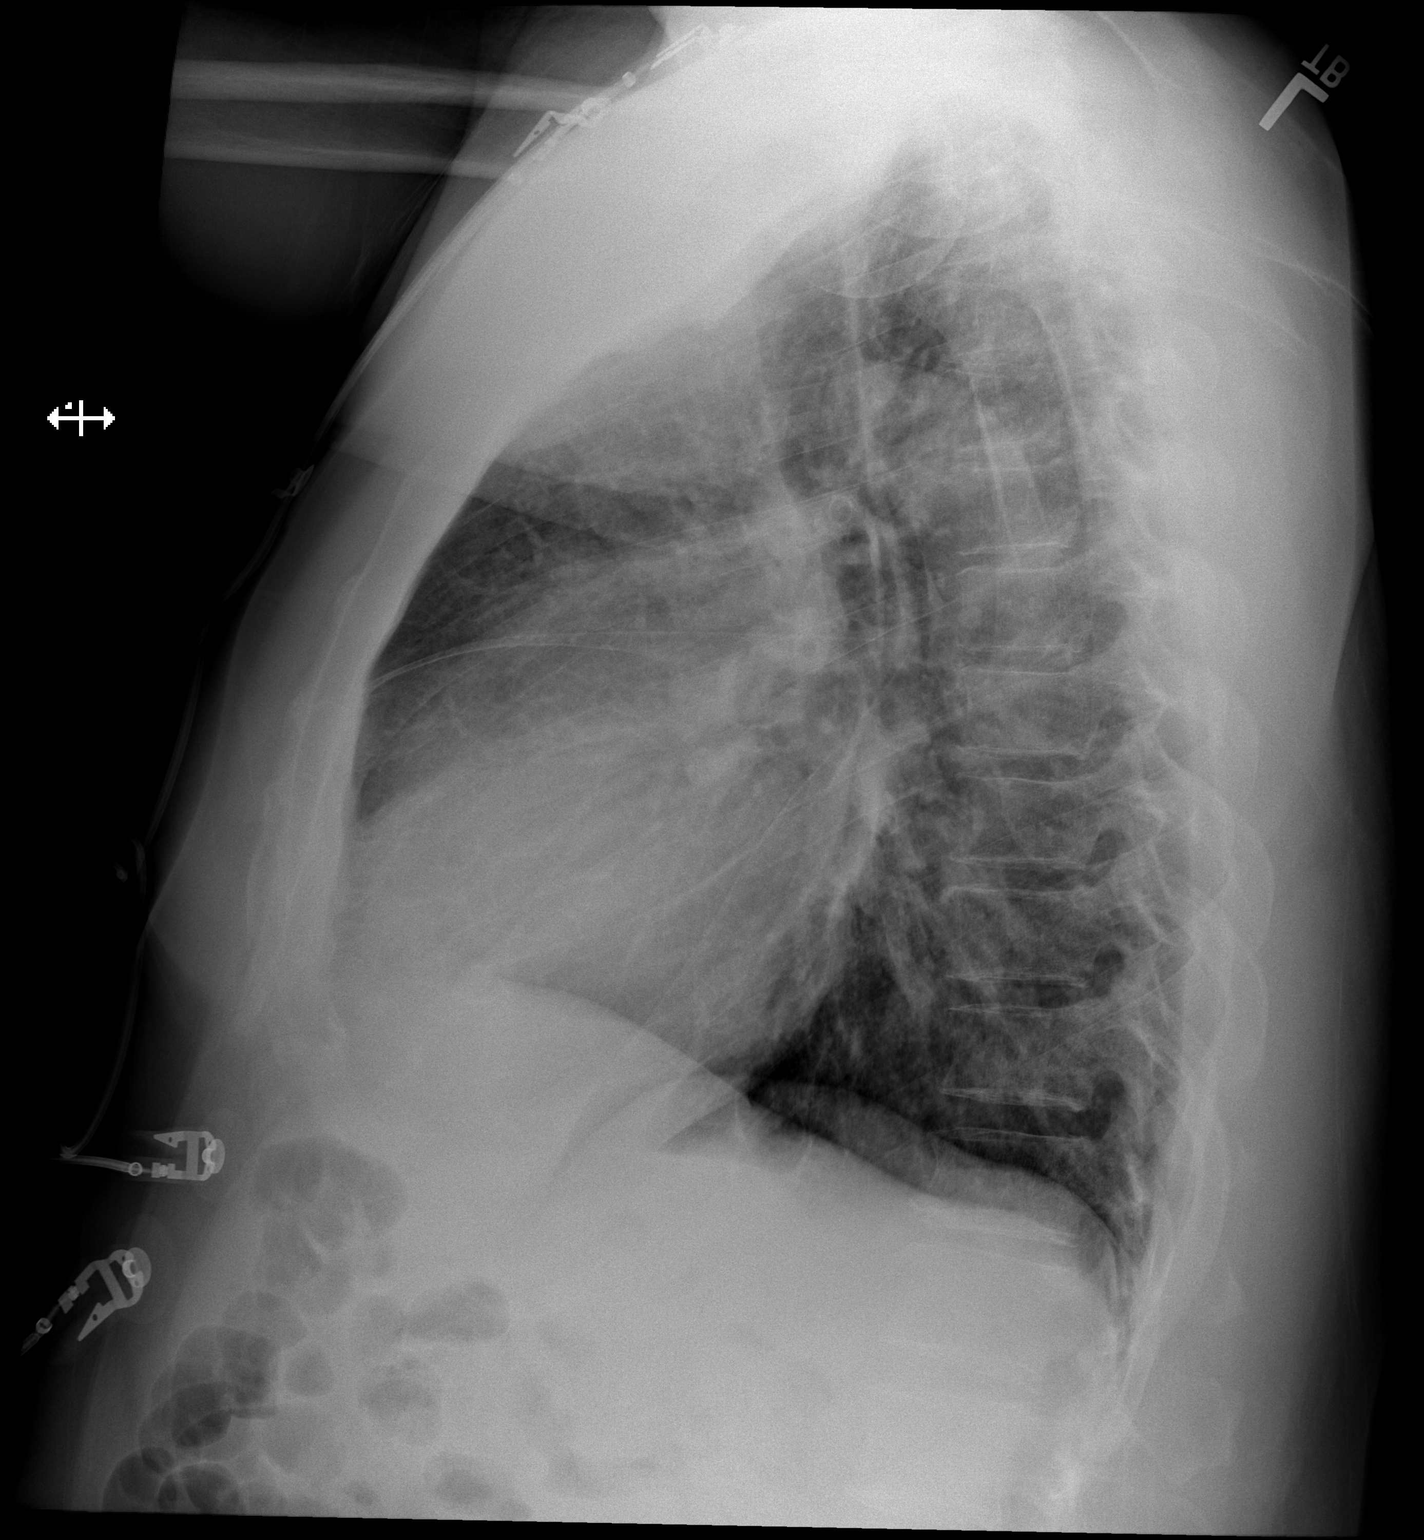

[2 of 2 positions shown; findings below may reference images not displayed]

FINDINGS: PA and lateral chest radiographs are provided.  There is no focal
parenchymal opacity, pleural effusion, or pneumothorax. The heart and
mediastinum are unremarkable.  The osseous structures are unremarkable.
IMPRESSION: No acute disease of the chest.

## 2011-03-07 ENCOUNTER — Inpatient Hospital Stay: Payer: Self-pay | Admitting: Internal Medicine

## 2011-03-07 LAB — COMPREHENSIVE METABOLIC PANEL
Alkaline Phosphatase: 78 U/L (ref 50–136)
Anion Gap: 14 (ref 7–16)
Bilirubin,Total: 0.8 mg/dL (ref 0.2–1.0)
Calcium, Total: 8.6 mg/dL (ref 8.5–10.1)
Co2: 25 mmol/L (ref 21–32)
Creatinine: 1.67 mg/dL — ABNORMAL HIGH (ref 0.60–1.30)
EGFR (Non-African Amer.): 47 — ABNORMAL LOW
Osmolality: 290 (ref 275–301)
Potassium: 3.9 mmol/L (ref 3.5–5.1)
Sodium: 144 mmol/L (ref 136–145)

## 2011-03-07 LAB — TROPONIN I
Troponin-I: 0.16 ng/mL — ABNORMAL HIGH
Troponin-I: 0.23 ng/mL — ABNORMAL HIGH

## 2011-03-07 LAB — CBC
MCH: 31.3 pg (ref 26.0–34.0)
MCHC: 33.2 g/dL (ref 32.0–36.0)
MCV: 94 fL (ref 80–100)
Platelet: 148 10*3/uL — ABNORMAL LOW (ref 150–440)
RBC: 4.58 10*6/uL (ref 4.40–5.90)
RDW: 20.4 % — ABNORMAL HIGH (ref 11.5–14.5)

## 2011-03-07 LAB — CK TOTAL AND CKMB (NOT AT ARMC)
CK, Total: 593 U/L — ABNORMAL HIGH (ref 35–232)
CK, Total: 532 U/L — ABNORMAL HIGH (ref 35–232)

## 2011-03-07 LAB — ETHANOL: Ethanol: 24 mg/dL

## 2011-03-07 IMAGING — CR DG CHEST 2V
1 series · 3 of 3 positions shown · non-contrast
Comparison: none

REASON FOR EXAM: pain
COMMENTS:   May transport without cardiac monitor

PROCEDURE:     DXR - DXR CHEST PA (OR AP) AND LATERAL  - [DATE]  [DATE]
RESULT:     Comparison: [DATE] and [DATE]

[Series 1: w chest pa · 0.14mm/px · 3 of 3 slices shown]
[im 1/3]
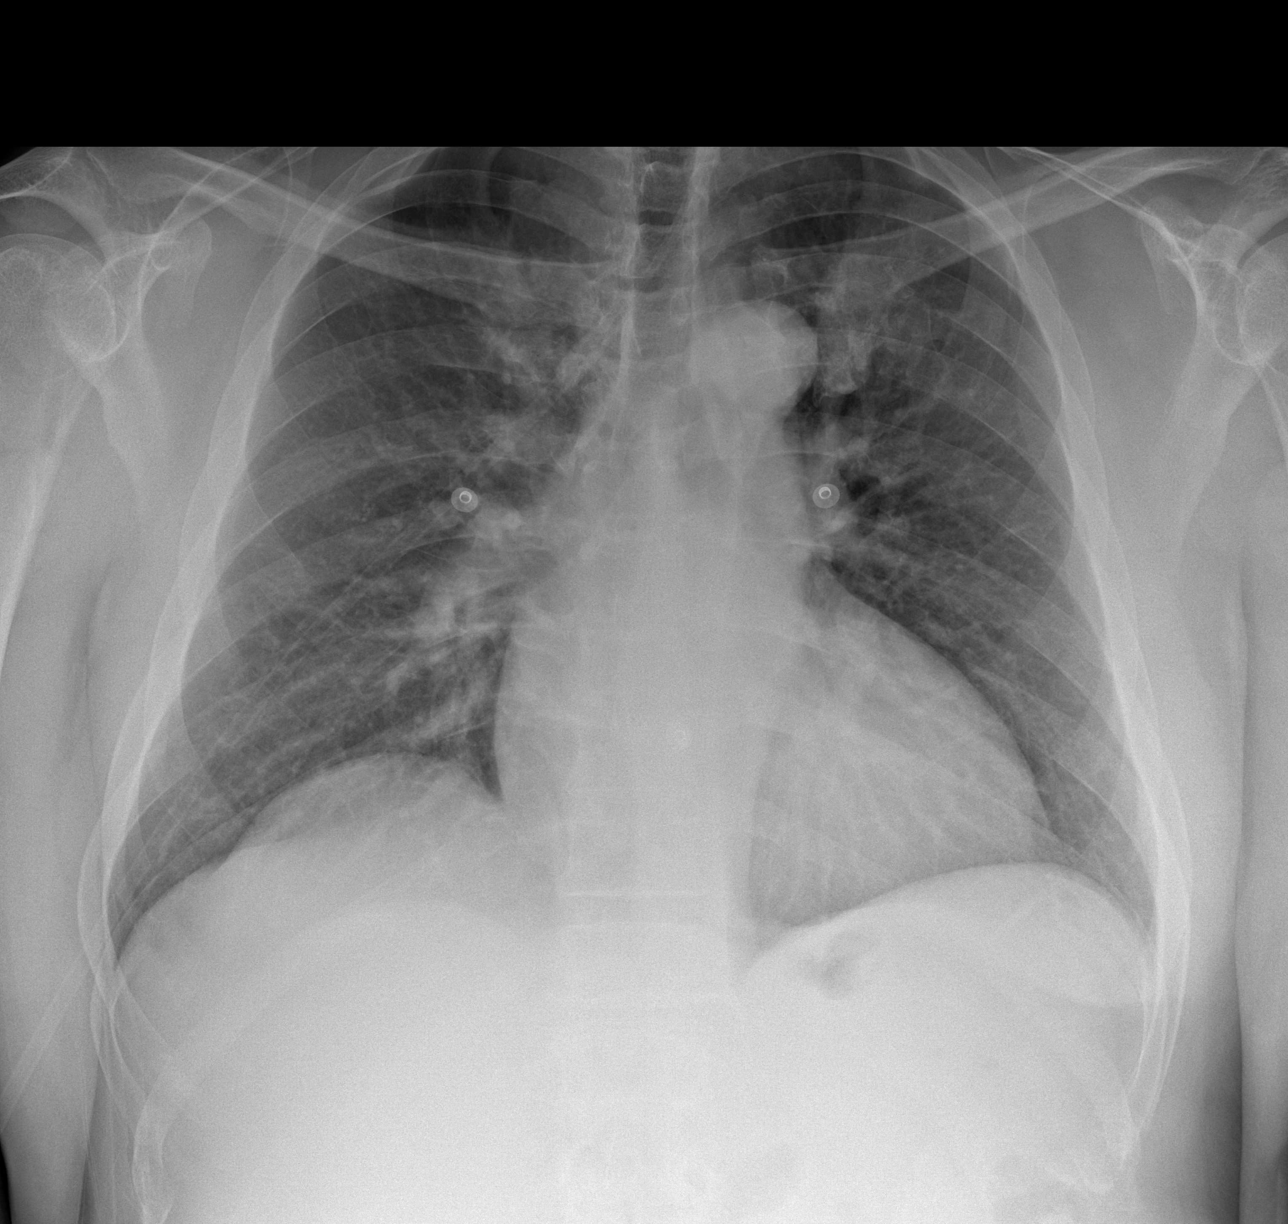
[im 2/3]
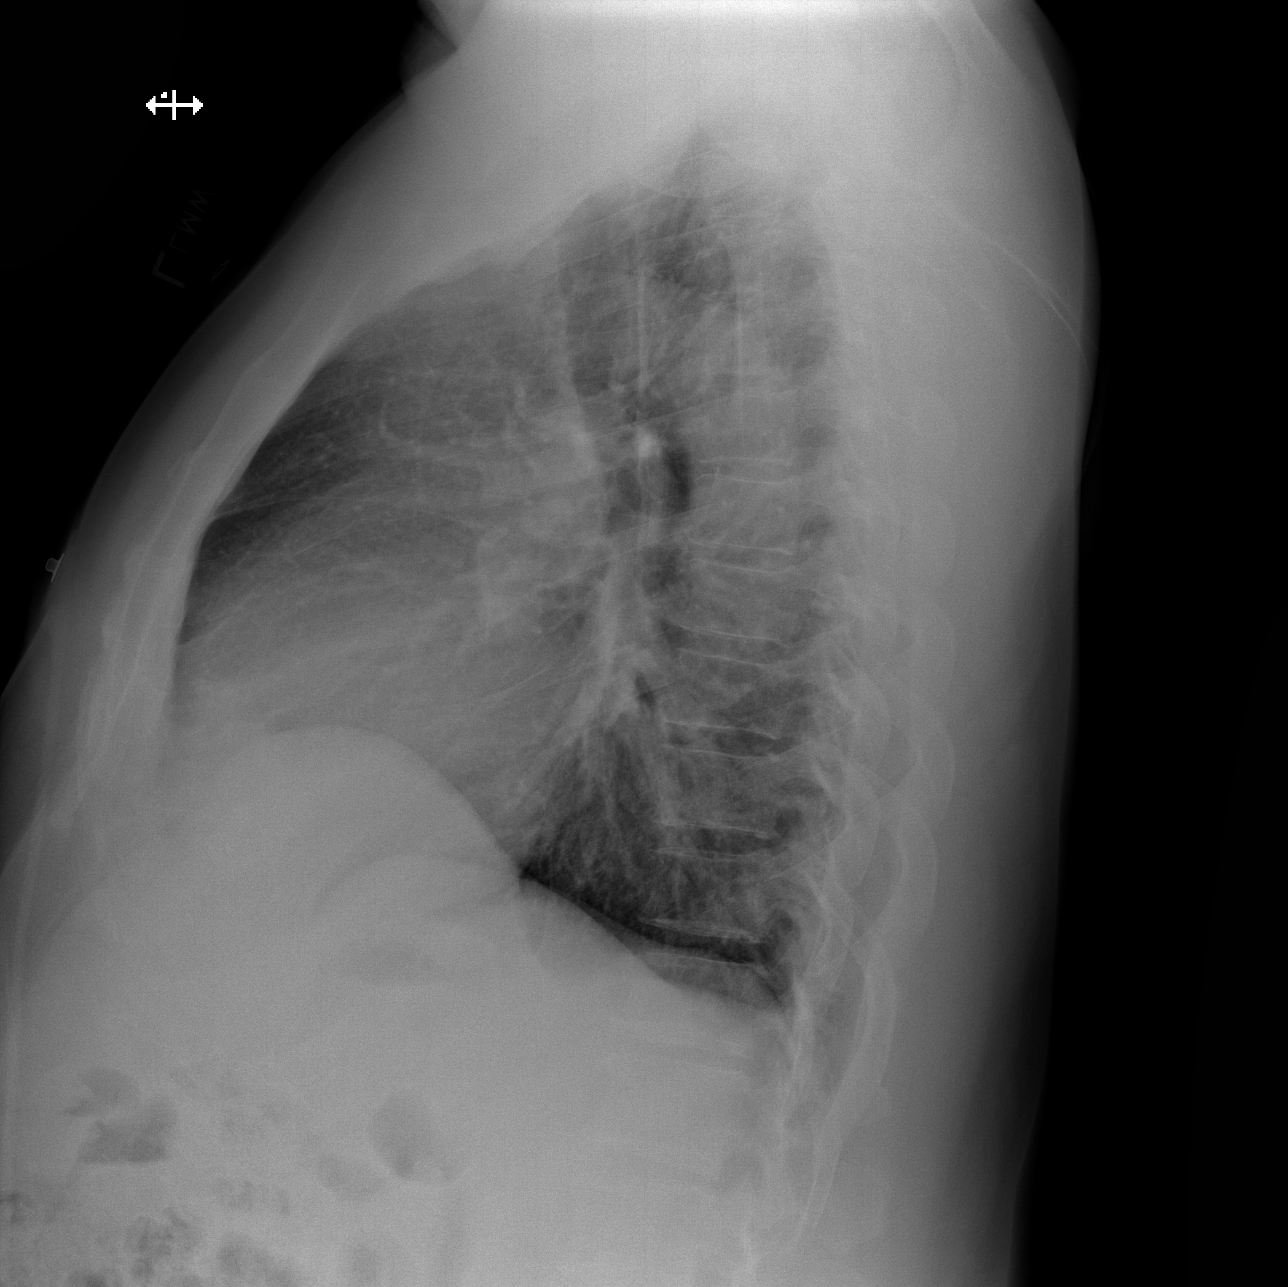
[im 3/3]
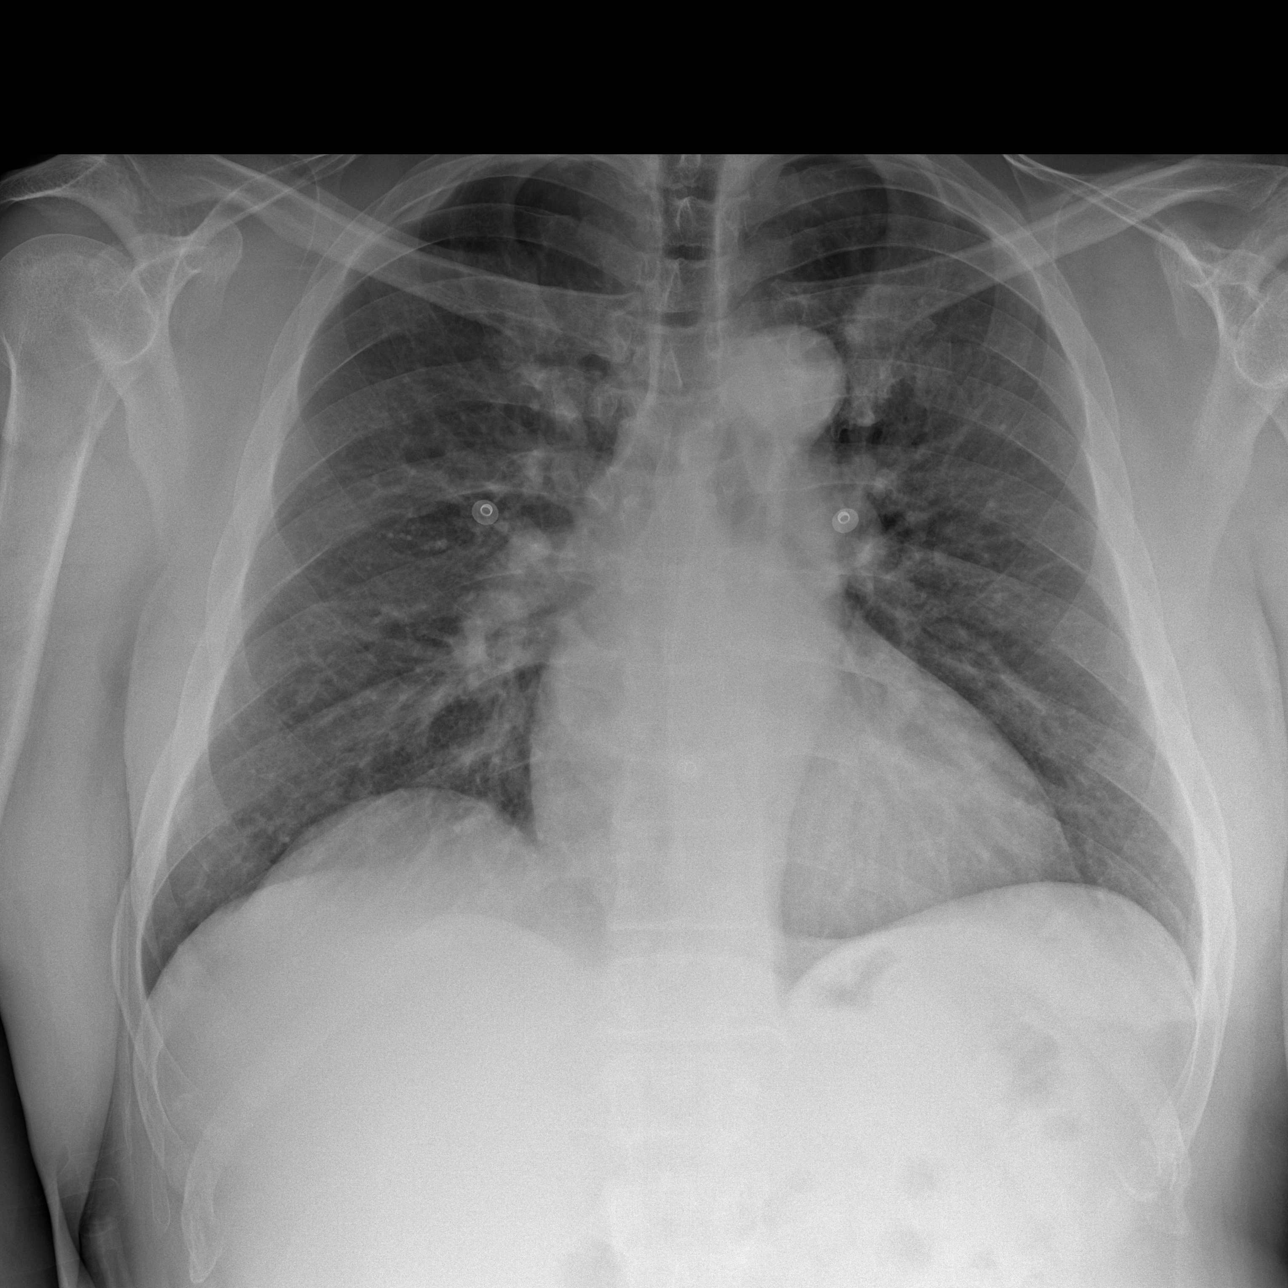

[3 of 3 positions shown; findings below may reference images not displayed]

FINDINGS: Cardiomegaly and the mediastinum are similar to prior. Increased density in
the medial upper lobes is similar to prior studies and likely secondary to
overlying costochondral junctions. No focal pulmonary opacities.
IMPRESSION: No acute cardiopulmonary disease.

## 2011-03-08 LAB — TROPONIN I
Troponin-I: 0.12 ng/mL — ABNORMAL HIGH
Troponin-I: 0.12 ng/mL — ABNORMAL HIGH

## 2011-03-09 LAB — BASIC METABOLIC PANEL
Anion Gap: 11 (ref 7–16)
BUN: 23 mg/dL — ABNORMAL HIGH (ref 7–18)
Calcium, Total: 8.3 mg/dL — ABNORMAL LOW (ref 8.5–10.1)
Chloride: 102 mmol/L (ref 98–107)
Co2: 28 mmol/L (ref 21–32)
Creatinine: 1.7 mg/dL — ABNORMAL HIGH (ref 0.60–1.30)
EGFR (African American): 56 — ABNORMAL LOW
EGFR (Non-African Amer.): 46 — ABNORMAL LOW
Osmolality: 286 (ref 275–301)

## 2011-03-09 LAB — TROPONIN I: Troponin-I: 0.11 ng/mL — ABNORMAL HIGH

## 2011-03-10 LAB — BASIC METABOLIC PANEL
Calcium, Total: 8.4 mg/dL — ABNORMAL LOW (ref 8.5–10.1)
Chloride: 102 mmol/L (ref 98–107)
Co2: 28 mmol/L (ref 21–32)
EGFR (Non-African Amer.): 42 — ABNORMAL LOW
Glucose: 114 mg/dL — ABNORMAL HIGH (ref 65–99)
Osmolality: 284 (ref 275–301)
Potassium: 4 mmol/L (ref 3.5–5.1)
Sodium: 140 mmol/L (ref 136–145)

## 2011-09-05 ENCOUNTER — Emergency Department: Payer: Self-pay | Admitting: Emergency Medicine

## 2011-09-05 LAB — COMPREHENSIVE METABOLIC PANEL
Albumin: 3.8 g/dL (ref 3.4–5.0)
Anion Gap: 11 (ref 7–16)
BUN: 13 mg/dL (ref 7–18)
Bilirubin,Total: 0.7 mg/dL (ref 0.2–1.0)
Calcium, Total: 8.1 mg/dL — ABNORMAL LOW (ref 8.5–10.1)
Co2: 24 mmol/L (ref 21–32)
Glucose: 94 mg/dL (ref 65–99)
Potassium: 3.9 mmol/L (ref 3.5–5.1)
SGOT(AST): 83 U/L — ABNORMAL HIGH (ref 15–37)

## 2011-09-05 LAB — LIPASE, BLOOD: Lipase: 117 U/L (ref 73–393)

## 2011-09-05 LAB — URINALYSIS, COMPLETE
Bilirubin,UR: NEGATIVE
Glucose,UR: NEGATIVE mg/dL (ref 0–75)
Ketone: NEGATIVE
Leukocyte Esterase: NEGATIVE
Ph: 5 (ref 4.5–8.0)
Specific Gravity: 1.009 (ref 1.003–1.030)
Squamous Epithelial: <1
WBC UR: 6 /HPF (ref 0–5)

## 2011-09-05 LAB — CBC
HCT: 42.4 % (ref 40.0–52.0)
HGB: 14.1 g/dL (ref 13.0–18.0)
RBC: 4.65 10*6/uL (ref 4.40–5.90)
RDW: 15.4 % — ABNORMAL HIGH (ref 11.5–14.5)
WBC: 6.2 10*3/uL (ref 3.8–10.6)

## 2011-09-21 ENCOUNTER — Inpatient Hospital Stay: Payer: Self-pay | Admitting: Internal Medicine

## 2011-09-21 LAB — URINALYSIS, COMPLETE
Bilirubin,UR: NEGATIVE
Glucose,UR: NEGATIVE mg/dL (ref 0–75)
RBC,UR: 9 /HPF (ref 0–5)
Specific Gravity: 1.017 (ref 1.003–1.030)
Squamous Epithelial: 1
WBC UR: 66 /HPF (ref 0–5)

## 2011-09-21 LAB — TROPONIN I
Troponin-I: 0.15 ng/mL — ABNORMAL HIGH
Troponin-I: 0.09 ng/mL — ABNORMAL HIGH

## 2011-09-21 LAB — ETHANOL
Ethanol %: 0.043 % (ref 0.000–0.080)
Ethanol: 43 mg/dL

## 2011-09-21 LAB — CBC WITH DIFFERENTIAL/PLATELET
Basophil #: 0 10*3/uL (ref 0.0–0.1)
Basophil %: 0.8 %
Eosinophil %: 1.3 %
HCT: 43.8 % (ref 40.0–52.0)
HGB: 15.1 g/dL (ref 13.0–18.0)
Lymphocyte %: 27.4 %
MCV: 93 fL (ref 80–100)
Monocyte %: 9.5 %
Neutrophil #: 3.4 10*3/uL (ref 1.4–6.5)

## 2011-09-21 LAB — COMPREHENSIVE METABOLIC PANEL
Albumin: 3.7 g/dL (ref 3.4–5.0)
Alkaline Phosphatase: 107 U/L (ref 50–136)
Anion Gap: 13 (ref 7–16)
BUN: 15 mg/dL (ref 7–18)
Bilirubin,Total: 0.9 mg/dL (ref 0.2–1.0)
Calcium, Total: 8.5 mg/dL (ref 8.5–10.1)
Creatinine: 1.51 mg/dL — ABNORMAL HIGH (ref 0.60–1.30)
EGFR (African American): >60
EGFR (Non-African Amer.): 54 — ABNORMAL LOW
Glucose: 120 mg/dL — ABNORMAL HIGH (ref 65–99)
Osmolality: 281 (ref 275–301)
Potassium: 4 mmol/L (ref 3.5–5.1)
SGOT(AST): 106 U/L — ABNORMAL HIGH (ref 15–37)
SGPT (ALT): 76 U/L
Total Protein: 7.6 g/dL (ref 6.4–8.2)

## 2011-09-21 LAB — DRUG SCREEN, URINE
Amphetamines, Ur Screen: NEGATIVE (ref ?–1000)
Barbiturates, Ur Screen: NEGATIVE (ref ?–200)
Cannabinoid 50 Ng, Ur ~~LOC~~: NEGATIVE (ref ?–50)
Methadone, Ur Screen: NEGATIVE (ref ?–300)
Tricyclic, Ur Screen: NEGATIVE (ref ?–1000)

## 2011-09-21 LAB — APTT: Activated PTT: 30.4 secs (ref 23.6–35.9)

## 2011-09-21 LAB — HEMOGLOBIN: HGB: 14.6 g/dL (ref 13.0–18.0)

## 2011-09-21 LAB — CK TOTAL AND CKMB (NOT AT ARMC): CK-MB: 11.2 ng/mL — ABNORMAL HIGH (ref 0.5–3.6)

## 2011-09-21 LAB — PROTIME-INR: INR: 0.9

## 2011-09-21 LAB — LIPASE, BLOOD: Lipase: 137 U/L (ref 73–393)

## 2011-09-21 IMAGING — CR DG CHEST 1V PORT
1 series · 1 of 1 positions shown · non-contrast
Comparison: none

REASON FOR EXAM: cp
COMMENTS:

PROCEDURE:     DXR - DXR PORTABLE CHEST SINGLE VIEW  - [DATE]  [DATE]
RESULT:     Comparison: [DATE]

[portable]
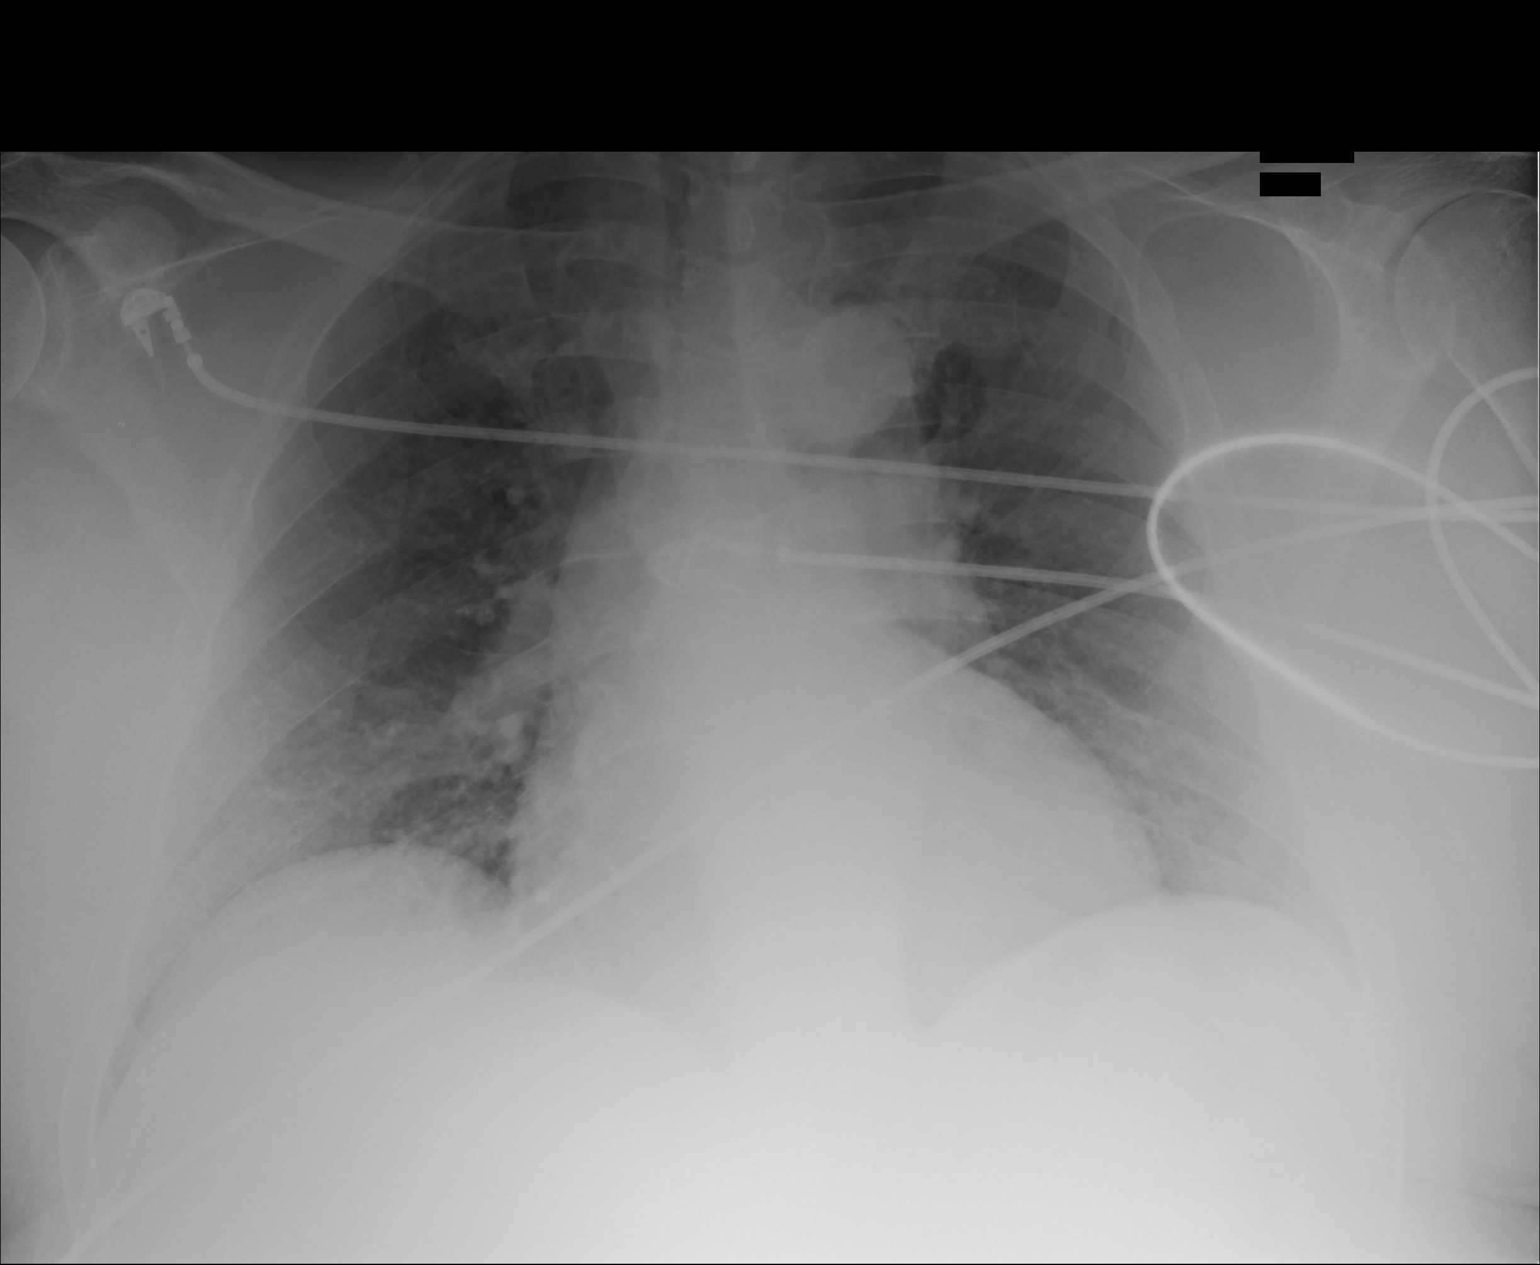

[1 of 1 positions shown; findings below may reference images not displayed]

FINDINGS: Single portable AP chest radiograph is provided.  There is no focal
parenchymal opacity, pleural effusion, or pneumothorax. Normal
cardiomediastinal silhouette. The osseous structures are unremarkable.
IMPRESSION: No acute disease of the chest. No significant interval change accounting for
differences in technique.

[REDACTED]

## 2011-09-22 LAB — LIPID PANEL
Cholesterol: 244 mg/dL — ABNORMAL HIGH (ref 0–200)
Ldl Cholesterol, Calc: 142 mg/dL — ABNORMAL HIGH (ref 0–100)
VLDL Cholesterol, Calc: 18 mg/dL (ref 5–40)

## 2011-09-22 LAB — BASIC METABOLIC PANEL
Anion Gap: 12 (ref 7–16)
BUN: 22 mg/dL — ABNORMAL HIGH (ref 7–18)
Calcium, Total: 8.3 mg/dL — ABNORMAL LOW (ref 8.5–10.1)
Chloride: 102 mmol/L (ref 98–107)
Co2: 24 mmol/L (ref 21–32)
Creatinine: 2.15 mg/dL — ABNORMAL HIGH (ref 0.60–1.30)
EGFR (African American): 41 — ABNORMAL LOW
Osmolality: 279 (ref 275–301)
Potassium: 3.9 mmol/L (ref 3.5–5.1)
Sodium: 138 mmol/L (ref 136–145)

## 2011-09-22 LAB — CBC WITH DIFFERENTIAL/PLATELET
Eosinophil #: 0.1 10*3/uL (ref 0.0–0.7)
Eosinophil %: 1.1 %
HCT: 42.3 % (ref 40.0–52.0)
HGB: 14.4 g/dL (ref 13.0–18.0)
Lymphocyte #: 0.9 10*3/uL — ABNORMAL LOW (ref 1.0–3.6)
MCHC: 34 g/dL (ref 32.0–36.0)
Monocyte #: 0.5 x10 3/mm (ref 0.2–1.0)
Monocyte %: 8.9 %
Neutrophil #: 4.2 10*3/uL (ref 1.4–6.5)
Platelet: 156 10*3/uL (ref 150–440)
RBC: 4.5 10*6/uL (ref 4.40–5.90)
WBC: 5.6 10*3/uL (ref 3.8–10.6)

## 2011-09-22 LAB — HEMOGLOBIN: HGB: 14.3 g/dL (ref 13.0–18.0)

## 2011-09-23 LAB — BASIC METABOLIC PANEL
Anion Gap: 10 (ref 7–16)
BUN: 25 mg/dL — ABNORMAL HIGH (ref 7–18)
Calcium, Total: 8 mg/dL — ABNORMAL LOW (ref 8.5–10.1)
Chloride: 104 mmol/L (ref 98–107)
Co2: 25 mmol/L (ref 21–32)
Osmolality: 283 (ref 275–301)
Potassium: 3.7 mmol/L (ref 3.5–5.1)

## 2011-09-23 LAB — URINE CULTURE

## 2011-09-24 LAB — BASIC METABOLIC PANEL
Calcium, Total: 8 mg/dL — ABNORMAL LOW (ref 8.5–10.1)
Chloride: 104 mmol/L (ref 98–107)
EGFR (Non-African Amer.): 42 — ABNORMAL LOW
Glucose: 104 mg/dL — ABNORMAL HIGH (ref 65–99)
Osmolality: 282 (ref 275–301)
Potassium: 3.7 mmol/L (ref 3.5–5.1)
Sodium: 139 mmol/L (ref 136–145)

## 2011-09-24 LAB — LIPASE, BLOOD: Lipase: 118 U/L (ref 73–393)

## 2011-09-25 LAB — COMPREHENSIVE METABOLIC PANEL
Anion Gap: 11 (ref 7–16)
BUN: 25 mg/dL — ABNORMAL HIGH (ref 7–18)
Bilirubin,Total: 0.3 mg/dL (ref 0.2–1.0)
Calcium, Total: 8.5 mg/dL (ref 8.5–10.1)
Co2: 23 mmol/L (ref 21–32)
EGFR (African American): 53 — ABNORMAL LOW
Glucose: 115 mg/dL — ABNORMAL HIGH (ref 65–99)
Potassium: 4.2 mmol/L (ref 3.5–5.1)
SGOT(AST): 45 U/L — ABNORMAL HIGH (ref 15–37)
Sodium: 139 mmol/L (ref 136–145)
Total Protein: 6.3 g/dL — ABNORMAL LOW (ref 6.4–8.2)

## 2013-08-25 ENCOUNTER — Inpatient Hospital Stay: Payer: Self-pay | Admitting: Internal Medicine

## 2013-08-25 LAB — COMPREHENSIVE METABOLIC PANEL
ALBUMIN: 2.7 g/dL — AB (ref 3.4–5.0)
Alkaline Phosphatase: 58 U/L
Anion Gap: 11 (ref 7–16)
BILIRUBIN TOTAL: 0.6 mg/dL (ref 0.2–1.0)
BUN: 22 mg/dL — AB (ref 7–18)
Calcium, Total: 7.6 mg/dL — ABNORMAL LOW (ref 8.5–10.1)
Chloride: 106 mmol/L (ref 98–107)
Co2: 24 mmol/L (ref 21–32)
Creatinine: 2.39 mg/dL — ABNORMAL HIGH (ref 0.60–1.30)
EGFR (African American): 36 — ABNORMAL LOW
EGFR (Non-African Amer.): 31 — ABNORMAL LOW
Glucose: 143 mg/dL — ABNORMAL HIGH (ref 65–99)
OSMOLALITY: 287 (ref 275–301)
POTASSIUM: 3.5 mmol/L (ref 3.5–5.1)
SGOT(AST): 22 U/L (ref 15–37)
SGPT (ALT): 18 U/L (ref 12–78)
Sodium: 141 mmol/L (ref 136–145)
Total Protein: 5.5 g/dL — ABNORMAL LOW (ref 6.4–8.2)

## 2013-08-25 LAB — URINALYSIS, COMPLETE
BACTERIA: NEGATIVE
BILIRUBIN, UR: NEGATIVE
Blood: NEGATIVE
GLUCOSE, UR: NEGATIVE mg/dL (ref 0–75)
Ketone: NEGATIVE
Nitrite: NEGATIVE
PH: 6 (ref 4.5–8.0)
Protein: 100
SPECIFIC GRAVITY: 1.015 (ref 1.003–1.030)

## 2013-08-25 LAB — CBC
HCT: 30.7 % — ABNORMAL LOW (ref 40.0–52.0)
HGB: 10.1 g/dL — ABNORMAL LOW (ref 13.0–18.0)
MCH: 27.9 pg (ref 26.0–34.0)
MCHC: 32.9 g/dL (ref 32.0–36.0)
MCV: 85 fL (ref 80–100)
Platelet: 197 10*3/uL (ref 150–440)
RBC: 3.62 10*6/uL — ABNORMAL LOW (ref 4.40–5.90)
RDW: 14.2 % (ref 11.5–14.5)
WBC: 9.1 10*3/uL (ref 3.8–10.6)

## 2013-08-25 LAB — HEMOGLOBIN
HGB: 10.4 g/dL — AB (ref 13.0–18.0)
HGB: 9.6 g/dL — AB (ref 13.0–18.0)

## 2013-08-25 LAB — PROTIME-INR
INR: 1.2
Prothrombin Time: 14.8 secs — ABNORMAL HIGH (ref 11.5–14.7)

## 2013-08-25 LAB — HEMATOCRIT
HCT: 31.4 % — AB (ref 40.0–52.0)
HCT: 29.8 % — ABNORMAL LOW (ref 40.0–52.0)

## 2013-08-25 LAB — LIPASE, BLOOD: Lipase: 106 U/L (ref 73–393)

## 2013-08-26 LAB — CBC WITH DIFFERENTIAL/PLATELET
BASOS PCT: 1 %
Basophil #: 0.1 10*3/uL (ref 0.0–0.1)
Eosinophil #: 0.2 10*3/uL (ref 0.0–0.7)
Eosinophil %: 2.3 %
HCT: 27.4 % — ABNORMAL LOW (ref 40.0–52.0)
HGB: 8.9 g/dL — ABNORMAL LOW (ref 13.0–18.0)
LYMPHS ABS: 2 10*3/uL (ref 1.0–3.6)
Lymphocyte %: 22.4 %
MCH: 27.7 pg (ref 26.0–34.0)
MCHC: 32.5 g/dL (ref 32.0–36.0)
MCV: 85 fL (ref 80–100)
MONO ABS: 0.7 x10 3/mm (ref 0.2–1.0)
MONOS PCT: 7.3 %
Neutrophil #: 6 10*3/uL (ref 1.4–6.5)
Neutrophil %: 67 %
Platelet: 194 10*3/uL (ref 150–440)
RBC: 3.21 10*6/uL — AB (ref 4.40–5.90)
RDW: 14.5 % (ref 11.5–14.5)
WBC: 9 10*3/uL (ref 3.8–10.6)

## 2013-08-26 LAB — BASIC METABOLIC PANEL
Anion Gap: 7 (ref 7–16)
BUN: 18 mg/dL (ref 7–18)
CO2: 25 mmol/L (ref 21–32)
Calcium, Total: 7.7 mg/dL — ABNORMAL LOW (ref 8.5–10.1)
Chloride: 110 mmol/L — ABNORMAL HIGH (ref 98–107)
Creatinine: 1.8 mg/dL — ABNORMAL HIGH (ref 0.60–1.30)
EGFR (Non-African Amer.): 43 — ABNORMAL LOW
GFR CALC AF AMER: 50 — AB
GLUCOSE: 96 mg/dL (ref 65–99)
Osmolality: 285 (ref 275–301)
Potassium: 4 mmol/L (ref 3.5–5.1)
Sodium: 142 mmol/L (ref 136–145)

## 2013-08-27 LAB — CBC WITH DIFFERENTIAL/PLATELET
Basophil #: 0.1 10*3/uL (ref 0.0–0.1)
Basophil %: 1 %
Eosinophil #: 0.2 10*3/uL (ref 0.0–0.7)
Eosinophil %: 2.5 %
HCT: 26.5 % — AB (ref 40.0–52.0)
HGB: 8.4 g/dL — ABNORMAL LOW (ref 13.0–18.0)
LYMPHS ABS: 2 10*3/uL (ref 1.0–3.6)
Lymphocyte %: 23.8 %
MCH: 27.1 pg (ref 26.0–34.0)
MCHC: 31.7 g/dL — AB (ref 32.0–36.0)
MCV: 86 fL (ref 80–100)
MONOS PCT: 8 %
Monocyte #: 0.7 x10 3/mm (ref 0.2–1.0)
Neutrophil #: 5.3 10*3/uL (ref 1.4–6.5)
Neutrophil %: 64.7 %
Platelet: 189 10*3/uL (ref 150–440)
RBC: 3.1 10*6/uL — AB (ref 4.40–5.90)
RDW: 14.3 % (ref 11.5–14.5)
WBC: 8.3 10*3/uL (ref 3.8–10.6)

## 2013-08-27 LAB — BASIC METABOLIC PANEL WITH GFR
Anion Gap: 8
BUN: 17 mg/dL
Calcium, Total: 7.5 mg/dL — ABNORMAL LOW
Chloride: 109 mmol/L — ABNORMAL HIGH
Co2: 24 mmol/L
Creatinine: 1.84 mg/dL — ABNORMAL HIGH
EGFR (African American): 49 — ABNORMAL LOW
EGFR (Non-African Amer.): 42 — ABNORMAL LOW
Glucose: 87 mg/dL
Osmolality: 282
Potassium: 4.1 mmol/L
Sodium: 141 mmol/L

## 2013-08-27 LAB — URINE CULTURE

## 2013-11-23 ENCOUNTER — Inpatient Hospital Stay: Payer: Self-pay | Admitting: Internal Medicine

## 2013-11-23 DIAGNOSIS — K922 Gastrointestinal hemorrhage, unspecified: Secondary | ICD-10-CM

## 2013-11-23 HISTORY — DX: Gastrointestinal hemorrhage, unspecified: K92.2

## 2013-11-23 LAB — COMPREHENSIVE METABOLIC PANEL
ALBUMIN: 3.5 g/dL (ref 3.4–5.0)
ALK PHOS: 61 U/L
ALT: 21 U/L
AST: 26 U/L (ref 15–37)
Anion Gap: 9 (ref 7–16)
BILIRUBIN TOTAL: 1.6 mg/dL — AB (ref 0.2–1.0)
BUN: 31 mg/dL — ABNORMAL HIGH (ref 7–18)
CHLORIDE: 103 mmol/L (ref 98–107)
CO2: 26 mmol/L (ref 21–32)
Calcium, Total: 8.7 mg/dL (ref 8.5–10.1)
Creatinine: 2.42 mg/dL — ABNORMAL HIGH (ref 0.60–1.30)
EGFR (African American): 37 — ABNORMAL LOW
GFR CALC NON AF AMER: 30 — AB
Glucose: 99 mg/dL (ref 65–99)
Osmolality: 282 (ref 275–301)
Potassium: 3.9 mmol/L (ref 3.5–5.1)
Sodium: 138 mmol/L (ref 136–145)
Total Protein: 6.7 g/dL (ref 6.4–8.2)

## 2013-11-23 LAB — CBC
HCT: 31.3 % — ABNORMAL LOW (ref 40.0–52.0)
HGB: 9.2 g/dL — ABNORMAL LOW (ref 13.0–18.0)
MCH: 19.1 pg — ABNORMAL LOW (ref 26.0–34.0)
MCHC: 29.3 g/dL — ABNORMAL LOW (ref 32.0–36.0)
MCV: 65 fL — ABNORMAL LOW (ref 80–100)
Platelet: 331 10*3/uL (ref 150–440)
RBC: 4.8 10*6/uL (ref 4.40–5.90)
RDW: 21.5 % — AB (ref 11.5–14.5)
WBC: 9.3 10*3/uL (ref 3.8–10.6)

## 2013-11-23 LAB — HEMOGLOBIN
HGB: 8.1 g/dL — ABNORMAL LOW (ref 13.0–18.0)
HGB: 8.5 g/dL — AB (ref 13.0–18.0)

## 2013-11-23 LAB — URINALYSIS, COMPLETE
Bacteria: NONE SEEN
Bilirubin,UR: NEGATIVE
Blood: NEGATIVE
GLUCOSE, UR: NEGATIVE mg/dL (ref 0–75)
Ketone: NEGATIVE
Nitrite: NEGATIVE
Ph: 6 (ref 4.5–8.0)
SPECIFIC GRAVITY: 1.009 (ref 1.003–1.030)
Squamous Epithelial: <1
WBC UR: 17 /HPF (ref 0–5)

## 2013-11-23 LAB — TROPONIN I
Troponin-I: 0.16 ng/mL — ABNORMAL HIGH
Troponin-I: 0.12 ng/mL — ABNORMAL HIGH
Troponin-I: 0.1 ng/mL — ABNORMAL HIGH

## 2013-11-23 LAB — LIPASE, BLOOD: Lipase: 92 U/L (ref 73–393)

## 2013-11-23 IMAGING — US ABDOMEN ULTRASOUND LIMITED
1 series · 14 of 25 positions shown · non-contrast
Comparison: CT abdomen and pelvis [DATE]. Abdominal ultrasound
[DATE].

CLINICAL DATA: Epigastric pain and vomiting.

EXAM:
US ABDOMEN LIMITED - RIGHT UPPER QUADRANT

[Series 1: abdomen ultrasound limited · 0.32mm/px · 14 of 45 slices shown]
[im 1/45]
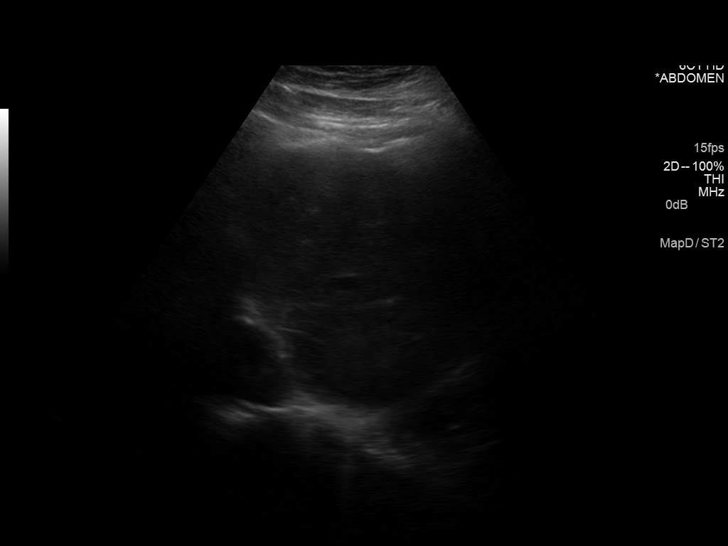
[im 4/45]
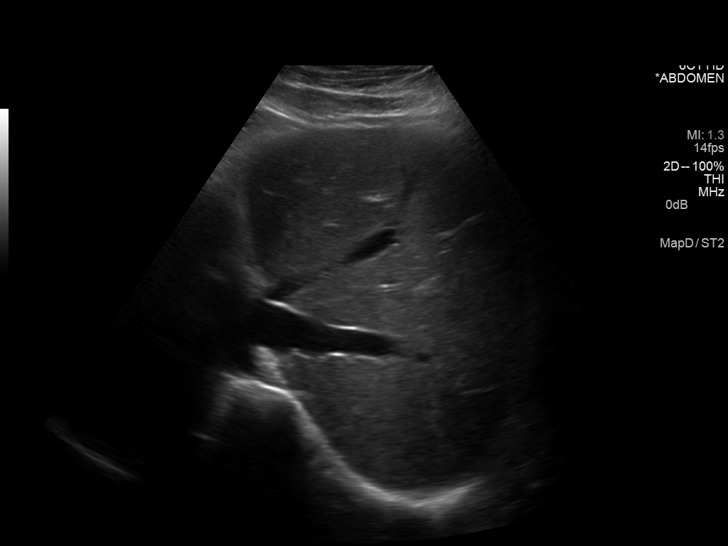
[im 8/45]
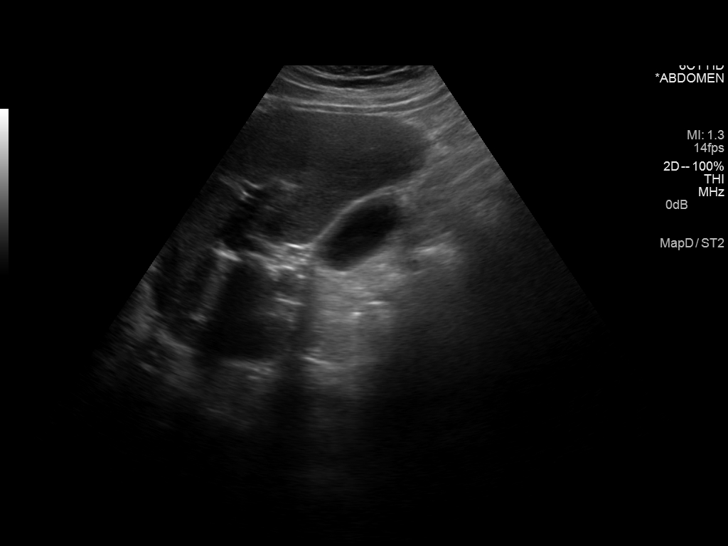
[im 12/45]
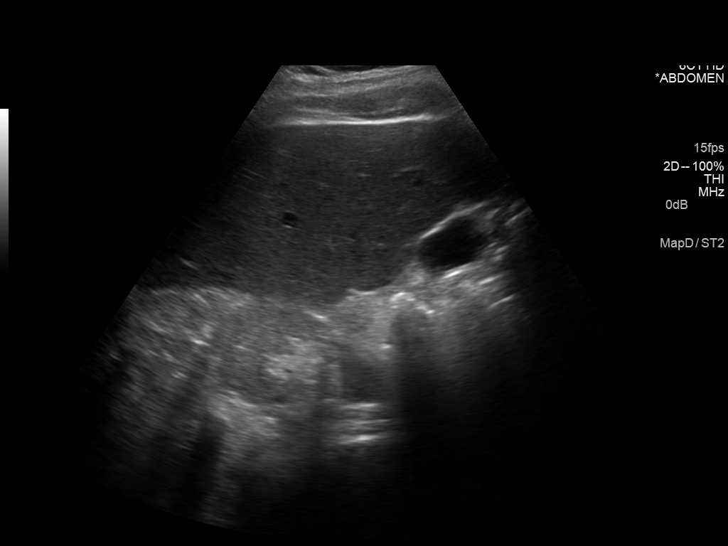
[im 15/45]
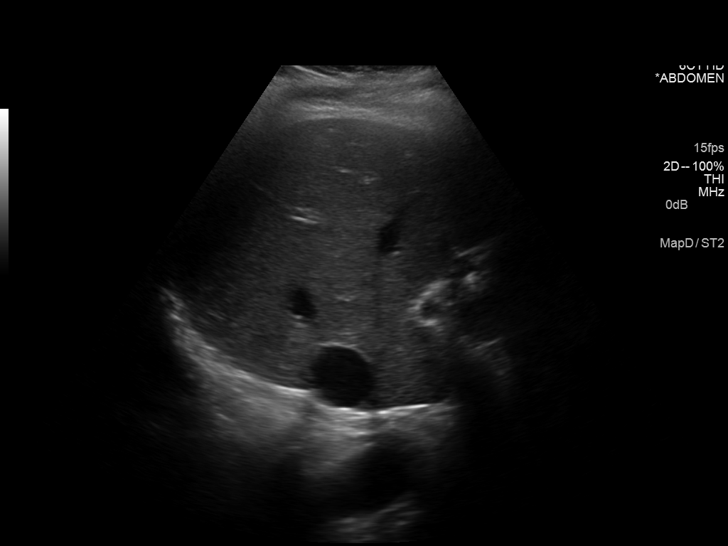
[im 17/45]
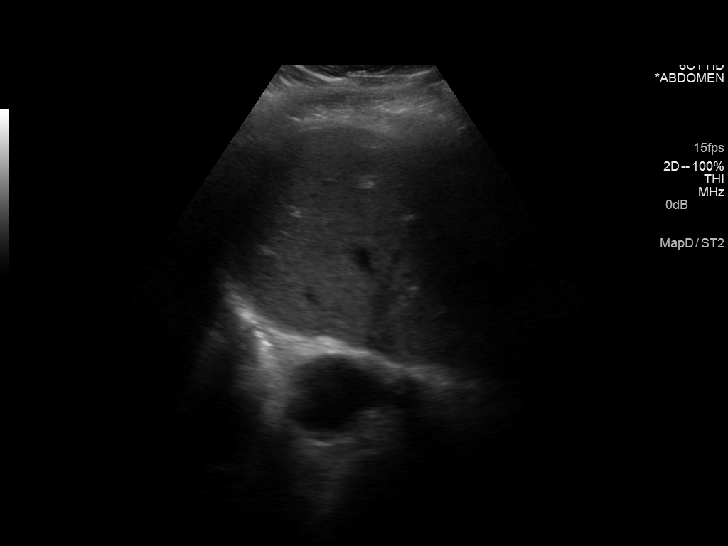
[im 21/45]
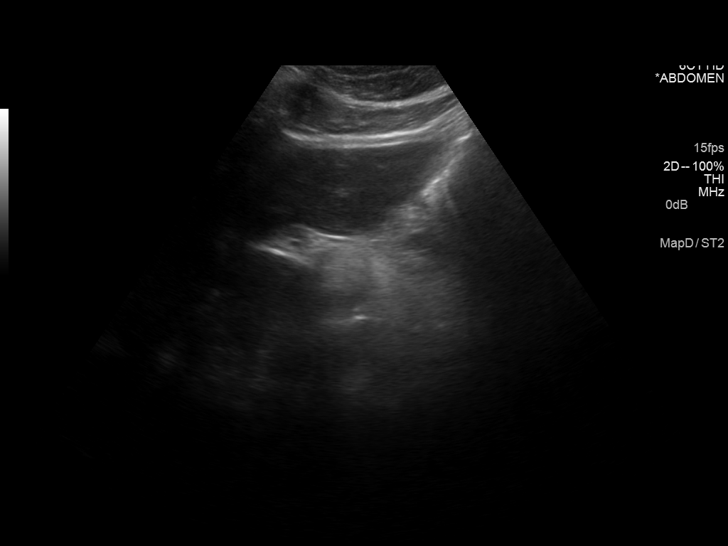
[im 24/45]
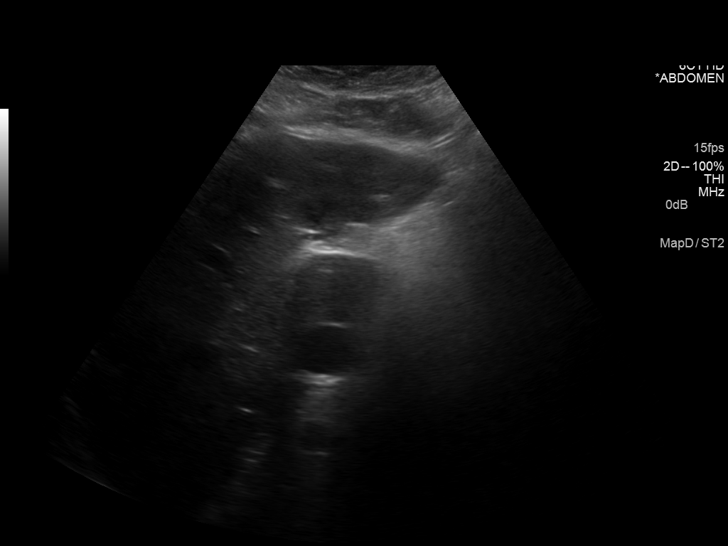
[im 28/45]
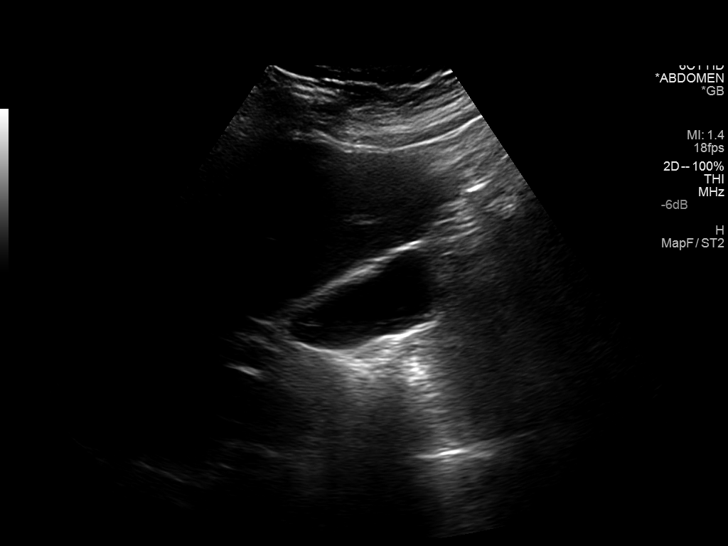
[im 30/45]
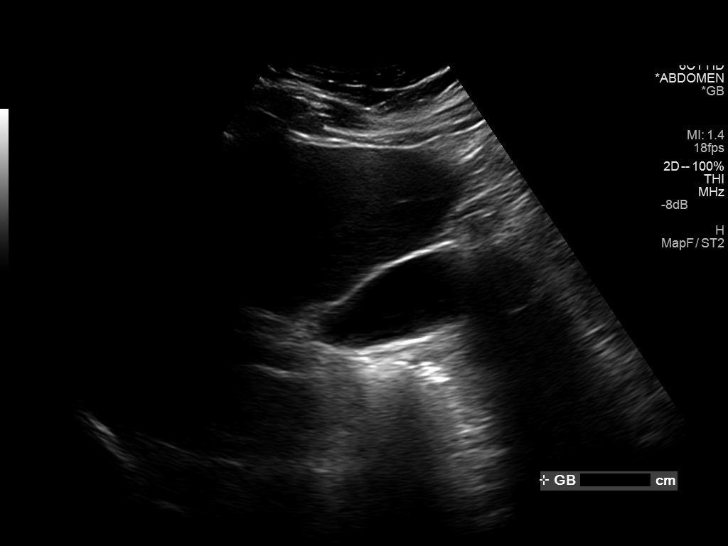
[im 34/45]
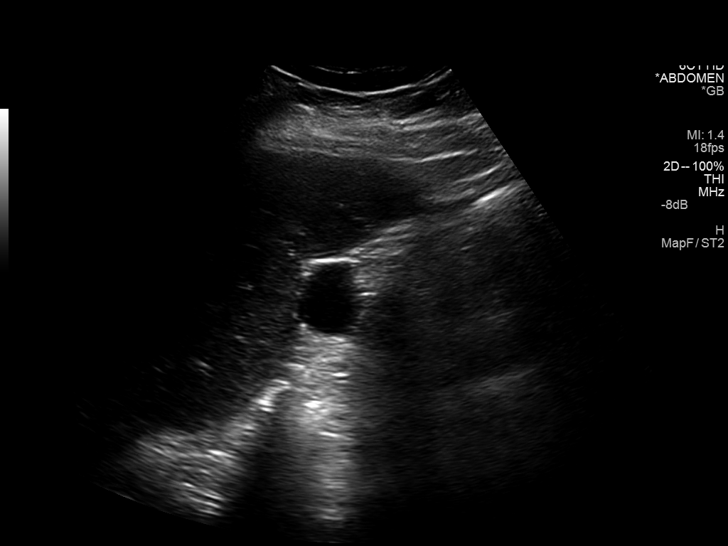
[im 37/45]
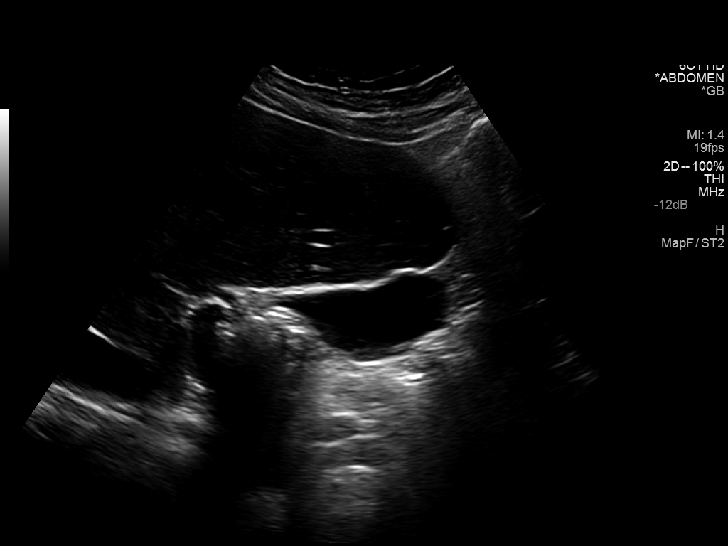
[im 41/45]
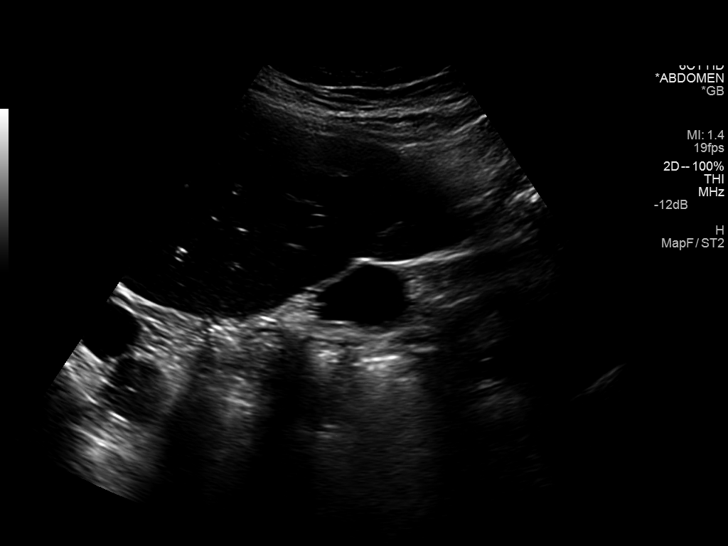
[im 45/45]
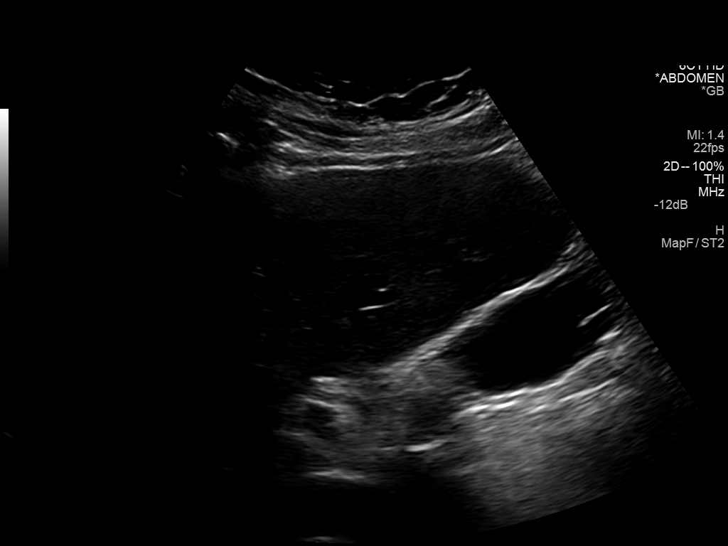

[14 of 25 positions shown; findings below may reference images not displayed]

FINDINGS: Gallbladder:

No gallstones or wall thickening visualized. No sonographic Murphy
sign noted.

Common bile duct:

Diameter: 0.4 cm

Liver:

No focal lesion identified. Within normal limits in parenchymal
echogenicity.
IMPRESSION: Negative for gallstones.  Negative examination.

## 2013-11-23 IMAGING — CR DG CHEST 1V PORT
1 series · 1 of 1 positions shown · non-contrast
Comparison: [DATE]

CLINICAL DATA: Epigastric abdominal pain, shortness of breath

EXAM:
PORTABLE CHEST - 1 VIEW

[ap]
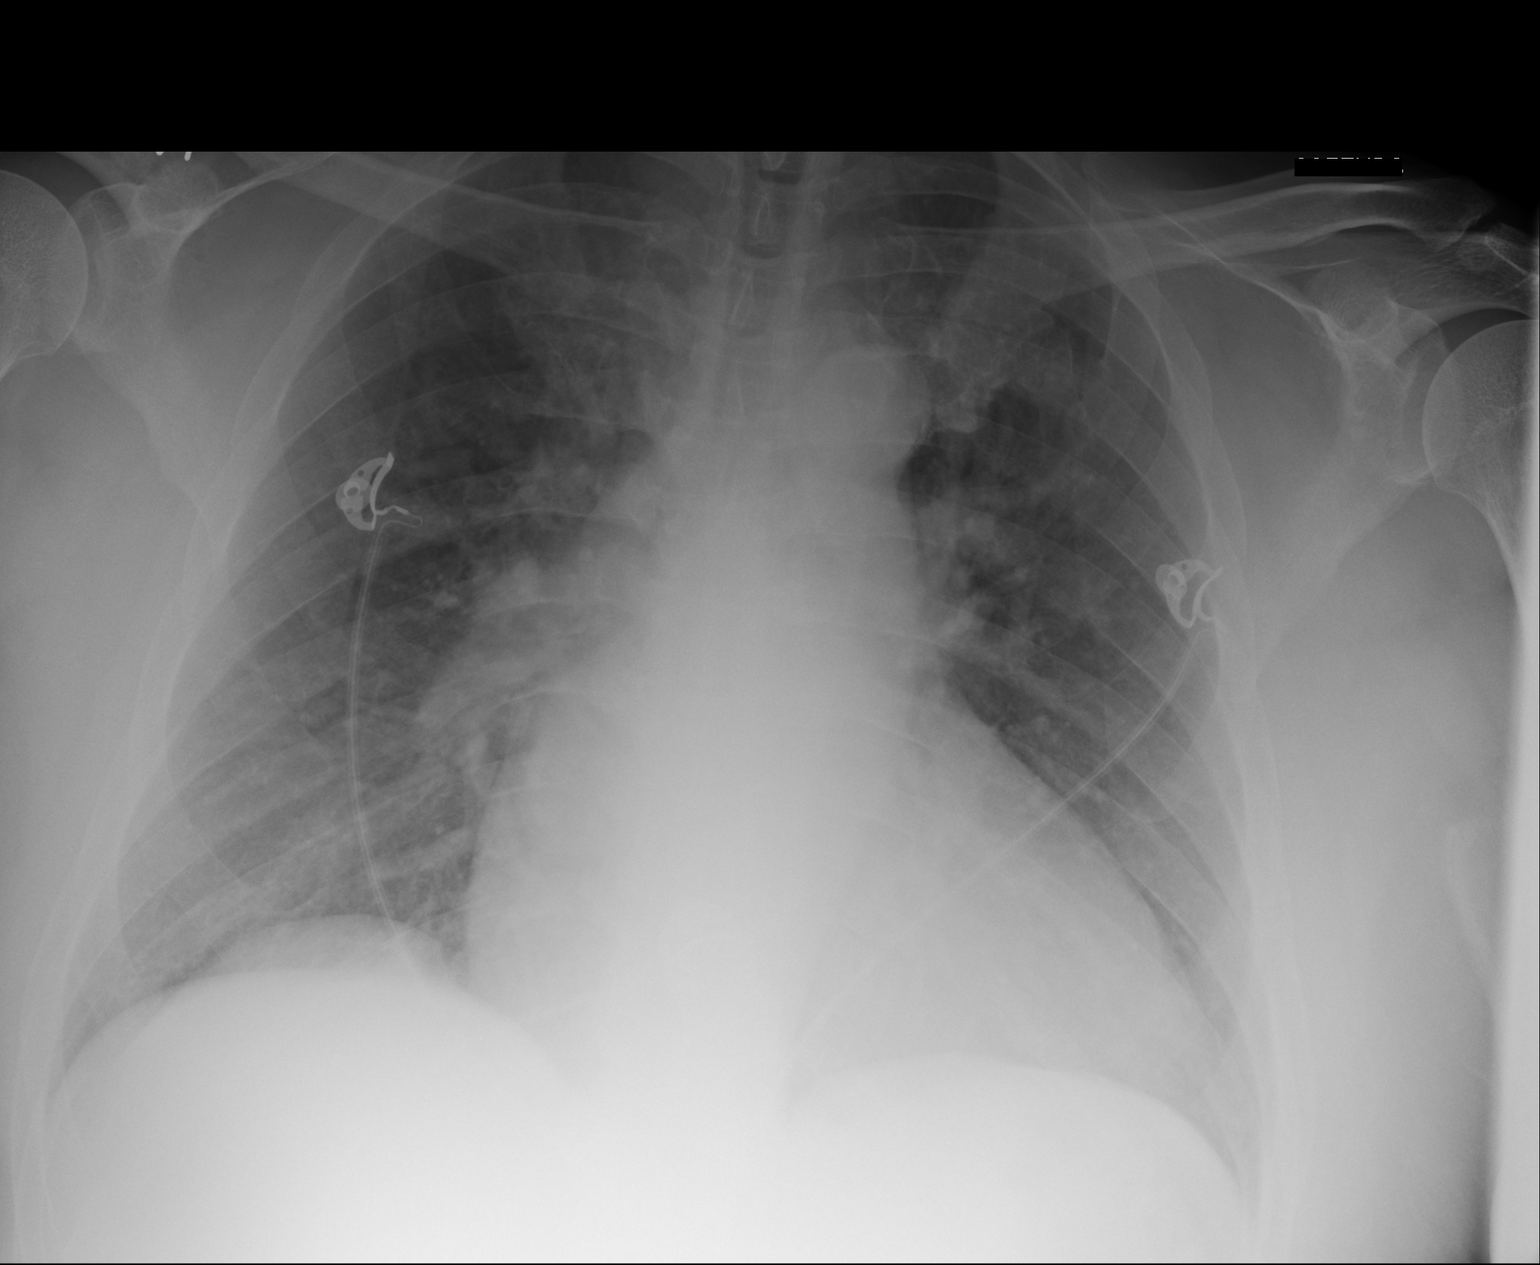

[1 of 1 positions shown; findings below may reference images not displayed]

FINDINGS: Cardiomegaly again noted with central vascular congestion. No
definite CHF, edema pattern, pneumonia, collapse or consolidation.
No effusion or pneumothorax. Trachea midline. Atherosclerosis noted
of the ectatic tortuous aorta.
IMPRESSION: Stable cardiomegaly with vascular congestion. No significant
interval change.

## 2013-11-24 LAB — CBC WITH DIFFERENTIAL/PLATELET
BASOS ABS: 0.1 10*3/uL (ref 0.0–0.1)
Basophil %: 1.1 %
Eosinophil #: 0.3 10*3/uL (ref 0.0–0.7)
Eosinophil %: 3.7 %
HCT: 29.1 % — ABNORMAL LOW (ref 40.0–52.0)
HGB: 8.5 g/dL — ABNORMAL LOW (ref 13.0–18.0)
Lymphocyte #: 1.5 10*3/uL (ref 1.0–3.6)
Lymphocyte %: 19.2 %
MCH: 18.9 pg — AB (ref 26.0–34.0)
MCHC: 29.3 g/dL — ABNORMAL LOW (ref 32.0–36.0)
MCV: 65 fL — ABNORMAL LOW (ref 80–100)
MONOS PCT: 7.8 %
Monocyte #: 0.6 x10 3/mm (ref 0.2–1.0)
Neutrophil #: 5.3 10*3/uL (ref 1.4–6.5)
Neutrophil %: 68.2 %
PLATELETS: 306 10*3/uL (ref 150–440)
RBC: 4.5 10*6/uL (ref 4.40–5.90)
RDW: 21.1 % — ABNORMAL HIGH (ref 11.5–14.5)
WBC: 7.7 10*3/uL (ref 3.8–10.6)

## 2013-11-24 LAB — HEPATIC FUNCTION PANEL A (ARMC)
ALT: 18 U/L
Albumin: 2.9 g/dL — ABNORMAL LOW (ref 3.4–5.0)
Alkaline Phosphatase: 52 U/L
Bilirubin, Direct: 0.6 mg/dL — ABNORMAL HIGH (ref 0.00–0.20)
Bilirubin,Total: 1.5 mg/dL — ABNORMAL HIGH (ref 0.2–1.0)
SGOT(AST): 20 U/L (ref 15–37)
Total Protein: 5.7 g/dL — ABNORMAL LOW (ref 6.4–8.2)

## 2013-11-24 LAB — BASIC METABOLIC PANEL
Anion Gap: 8 (ref 7–16)
BUN: 23 mg/dL — ABNORMAL HIGH (ref 7–18)
CHLORIDE: 104 mmol/L (ref 98–107)
CREATININE: 2.38 mg/dL — AB (ref 0.60–1.30)
Calcium, Total: 7.9 mg/dL — ABNORMAL LOW (ref 8.5–10.1)
Co2: 26 mmol/L (ref 21–32)
EGFR (African American): 38 — ABNORMAL LOW
EGFR (Non-African Amer.): 31 — ABNORMAL LOW
Glucose: 90 mg/dL (ref 65–99)
Osmolality: 279 (ref 275–301)
POTASSIUM: 3.7 mmol/L (ref 3.5–5.1)
Sodium: 138 mmol/L (ref 136–145)

## 2013-11-24 LAB — URINE CULTURE

## 2013-11-25 LAB — BASIC METABOLIC PANEL
ANION GAP: 6 — AB (ref 7–16)
BUN: 29 mg/dL — ABNORMAL HIGH (ref 7–18)
CREATININE: 2.52 mg/dL — AB (ref 0.60–1.30)
Calcium, Total: 7.6 mg/dL — ABNORMAL LOW (ref 8.5–10.1)
Chloride: 105 mmol/L (ref 98–107)
Co2: 28 mmol/L (ref 21–32)
EGFR (African American): 35 — ABNORMAL LOW
GFR CALC NON AF AMER: 29 — AB
GLUCOSE: 109 mg/dL — AB (ref 65–99)
Osmolality: 284 (ref 275–301)
Potassium: 3.6 mmol/L (ref 3.5–5.1)
Sodium: 139 mmol/L (ref 136–145)

## 2013-11-25 LAB — CBC WITH DIFFERENTIAL/PLATELET
Basophil #: 0.1 10*3/uL (ref 0.0–0.1)
Basophil %: 0.9 %
EOS ABS: 0.3 10*3/uL (ref 0.0–0.7)
Eosinophil %: 3.5 %
HCT: 27.3 % — ABNORMAL LOW (ref 40.0–52.0)
HGB: 8 g/dL — AB (ref 13.0–18.0)
LYMPHS ABS: 1 10*3/uL (ref 1.0–3.6)
LYMPHS PCT: 11 %
MCH: 19.2 pg — ABNORMAL LOW (ref 26.0–34.0)
MCHC: 29.5 g/dL — AB (ref 32.0–36.0)
MCV: 65 fL — ABNORMAL LOW (ref 80–100)
Monocyte #: 0.6 x10 3/mm (ref 0.2–1.0)
Monocyte %: 7.1 %
Neutrophil #: 7 10*3/uL — ABNORMAL HIGH (ref 1.4–6.5)
Neutrophil %: 77.5 %
Platelet: 292 10*3/uL (ref 150–440)
RBC: 4.2 10*6/uL — AB (ref 4.40–5.90)
RDW: 20.8 % — ABNORMAL HIGH (ref 11.5–14.5)
WBC: 9.1 10*3/uL (ref 3.8–10.6)

## 2013-11-26 LAB — BASIC METABOLIC PANEL
ANION GAP: 4 — AB (ref 7–16)
BUN: 23 mg/dL — ABNORMAL HIGH (ref 7–18)
CHLORIDE: 106 mmol/L (ref 98–107)
Calcium, Total: 7.4 mg/dL — ABNORMAL LOW (ref 8.5–10.1)
Co2: 28 mmol/L (ref 21–32)
Creatinine: 2.21 mg/dL — ABNORMAL HIGH (ref 0.60–1.30)
EGFR (Non-African Amer.): 34 — ABNORMAL LOW
GFR CALC AF AMER: 41 — AB
Glucose: 90 mg/dL (ref 65–99)
OSMOLALITY: 279 (ref 275–301)
Potassium: 4 mmol/L (ref 3.5–5.1)
Sodium: 138 mmol/L (ref 136–145)

## 2013-11-26 LAB — HEMOGLOBIN: HGB: 7.7 g/dL — ABNORMAL LOW (ref 13.0–18.0)

## 2013-11-26 IMAGING — CT CT ABD-PELV W/O CM
2 of 4 series · 16 of 46 positions shown, 18 images · non-contrast
Comparison: Right upper quadrant ultrasound [DATE], and
noncontrast CT scan of the abdomen and pelvis [DATE].

CLINICAL DATA: Nausea and hematemesis with upper abdominal pain as
well as pelvic pain; sensation of constipation ; elevated serum
creatinine

EXAM:
CT ABDOMEN AND PELVIS WITHOUT CONTRAST
TECHNIQUE: Multidetector CT imaging of the abdomen and pelvis was performed
following the standard protocol without IV contrast.

[Series 2: routine abd pel without · axial · non-contrast · 0.76mm/px · z∈[-936,-490]mm · 13 of 97 slices shown, 15 images]
[im 4/97  soft-tissue]
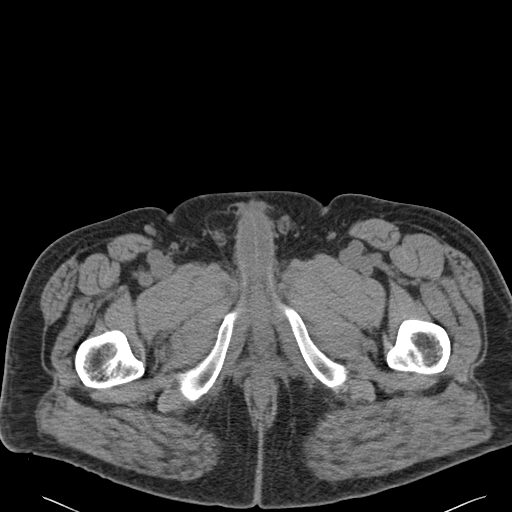
[im 4/97  bone]
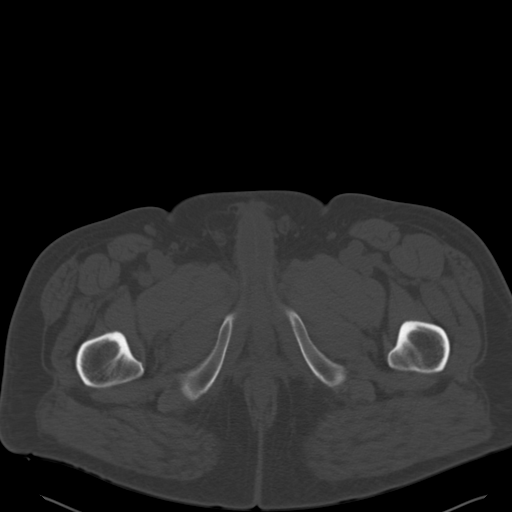
[im 12/97  soft-tissue]
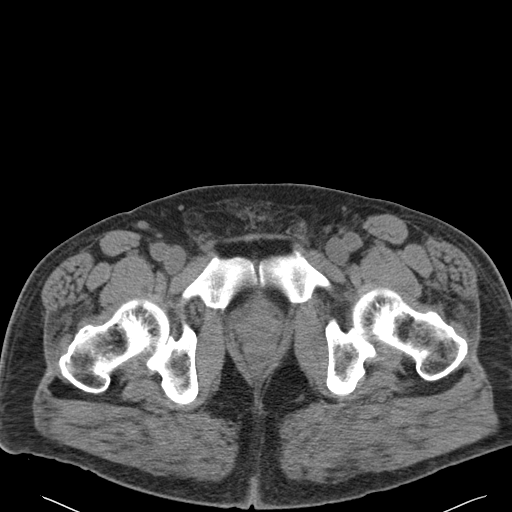
[im 20/97  soft-tissue]
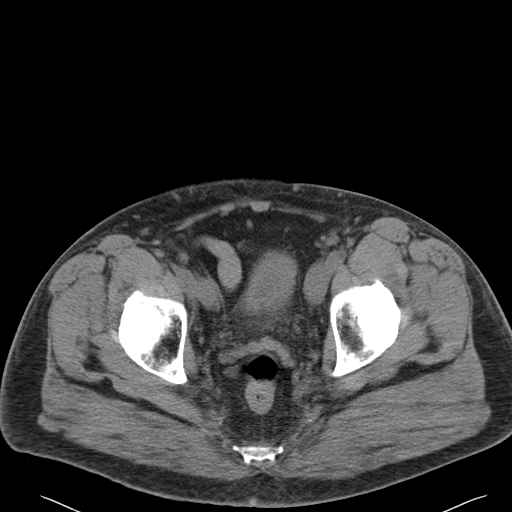
[im 27/97  soft-tissue]
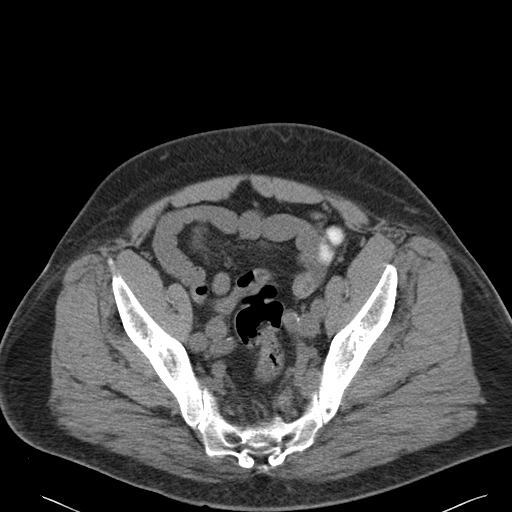
[im 35/97  soft-tissue]
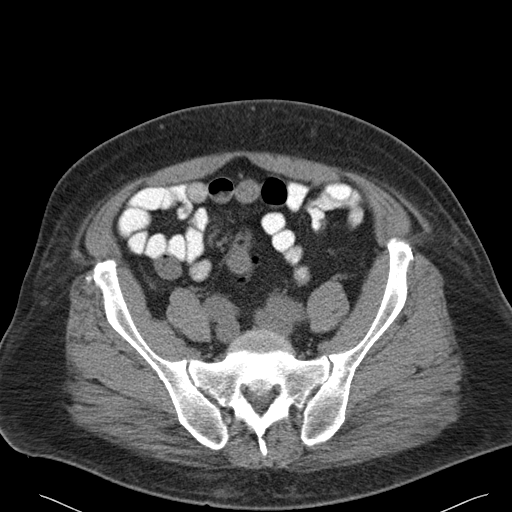
[im 43/97  soft-tissue]
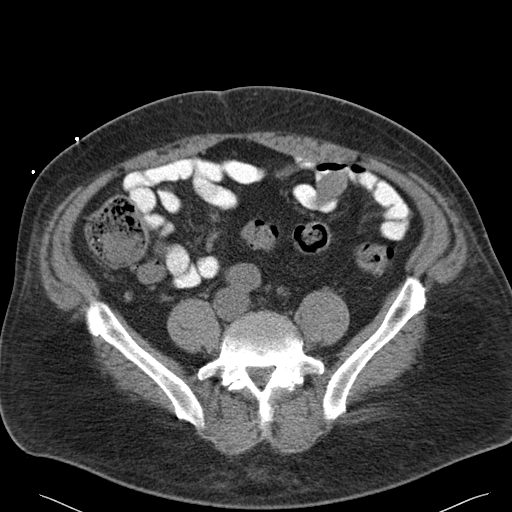
[im 50/97  soft-tissue]
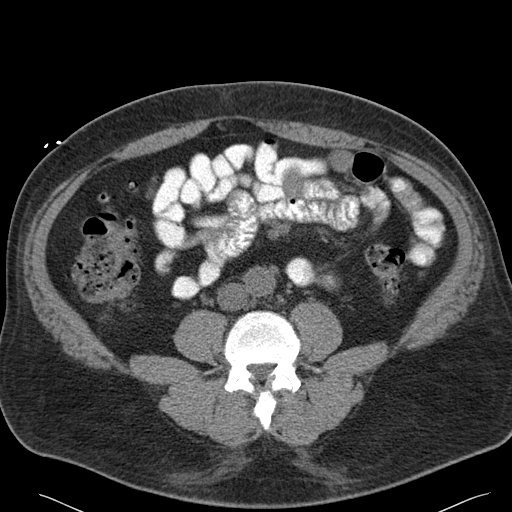
[im 54/97  soft-tissue]
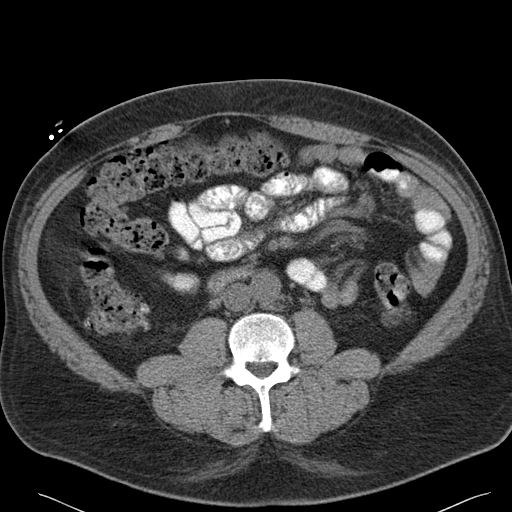
[im 62/97  soft-tissue]
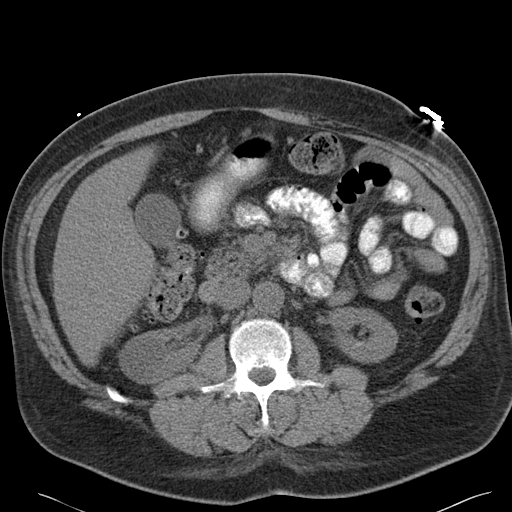
[im 62/97  bone]
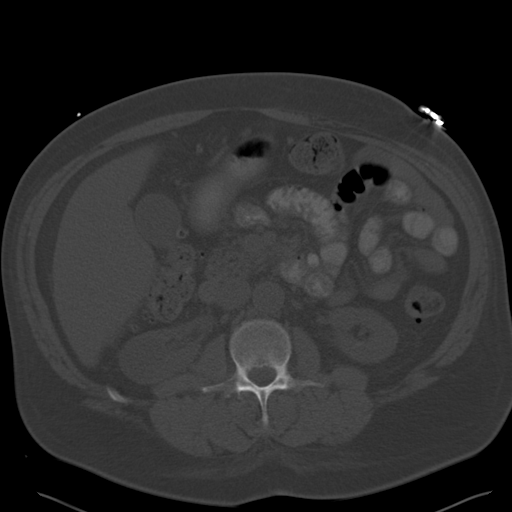
[im 70/97  soft-tissue]
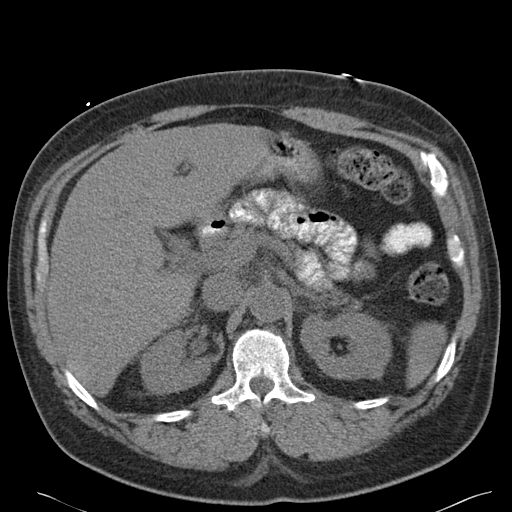
[im 77/97  soft-tissue]
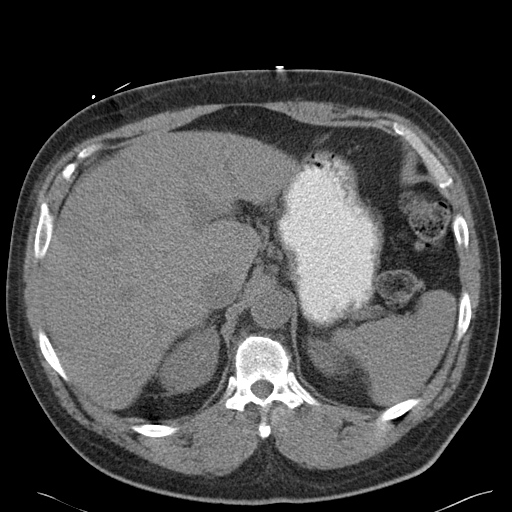
[im 85/97  soft-tissue]
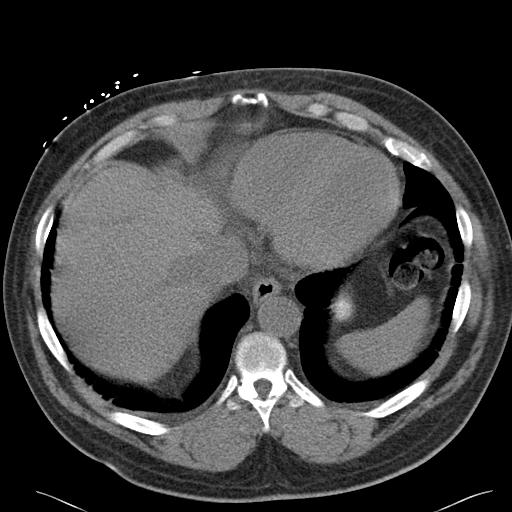
[im 93/97  soft-tissue]
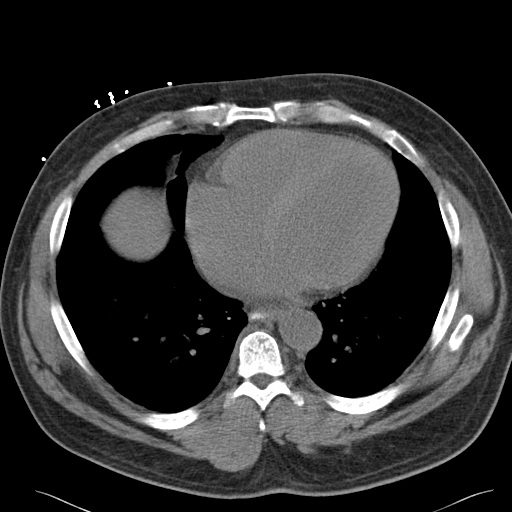

[Series 5: cor routine abd pel wo · coronal · 0.75mm/px · 3 of 154 slices shown]
[im 52/154  soft-tissue]
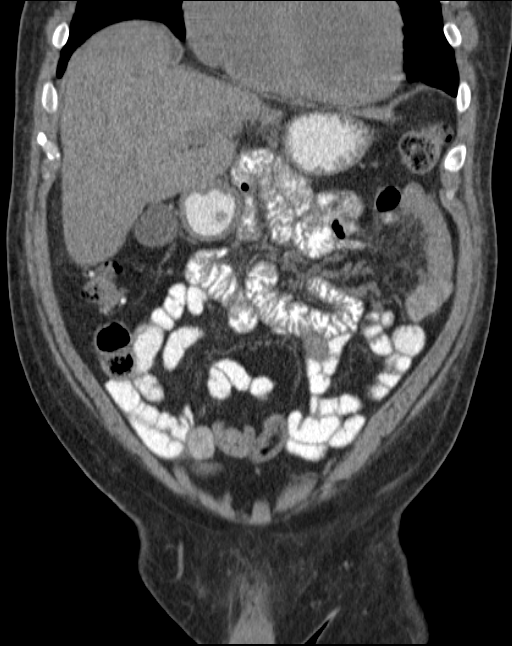
[im 69/154  soft-tissue]
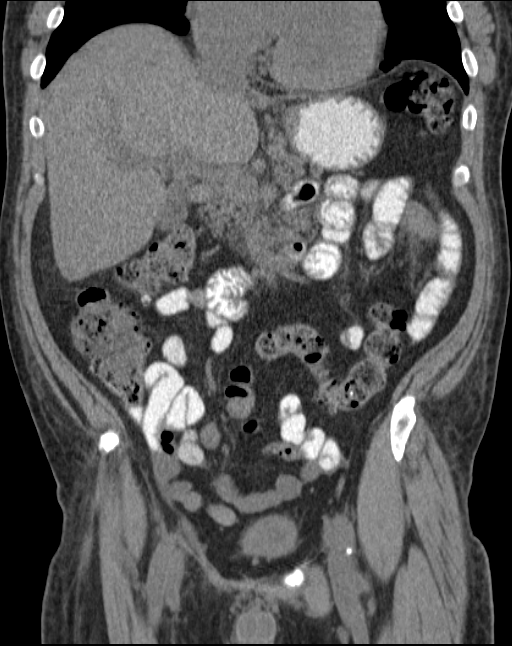
[im 86/154  soft-tissue]
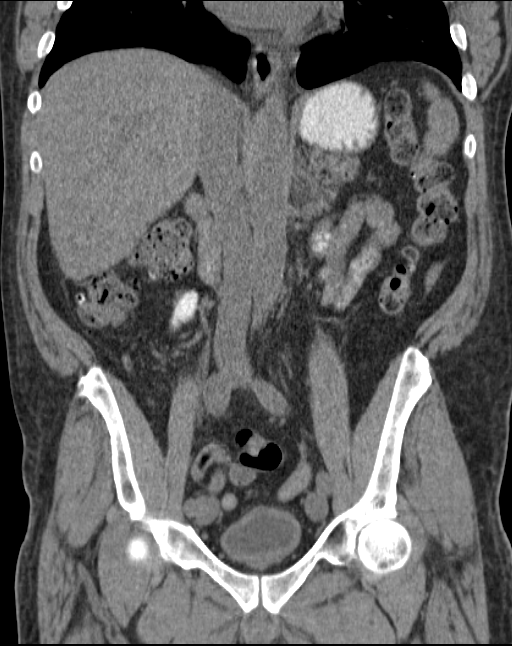

[16 of 46 positions shown; findings below may reference images not displayed]

FINDINGS: There is a small amount of ascites adjacent to the liver. The liver
is normal in density and contour. The gallbladder is adequately
distended with no evidence of stones. The pancreas, spleen,
partially distended stomach, adrenal glands, and kidneys exhibit no
acute abnormalities. There are hypodensities in both kidneys most
compatible with cysts. There is a focus of slightly increased
density in the midpole of the left kidney laterally which HU value
of +41 stable since [DATE]. There are no calcified stones.

The small and large bowel exhibit no evidence of ileus nor of
obstruction. The appendix is normal. The mesenteric fat exhibits
mild prominence of the vessels which may reflect venous congestion.
The partially distended urinary bladder exhibits a mildly thickened
wall. The prostate gland and seminal vesicles are normal. There is a
small amount of free pelvic fluid. There is no inguinal nor
significant umbilical hernia. The subcutaneous fat and abdominal and
pelvic wall musculature are unremarkable. The lumbar spine and bony
pelvis exhibit no acute abnormalities. The lung bases exhibit no
infiltrates. There is a tiny amount of pleural fluid layering
posteriorly on the left. There is a subcentimeter soft tissue
density nodule in the right lower lobe on image 1 stable since [GQ].
IMPRESSION: 1. There is a small amount of ascites surrounding of the liver and
within the pelvis that is of uncertain etiology. No acute
hepatobiliary abnormality is demonstrated. One cannot exclude portal
hypertension with early varices secondary to cirrhosis.
2. There is no acute bowel or urinary tract abnormality.
3.  There is a tiny left pleural effusion.

## 2013-11-28 LAB — CULTURE, BLOOD (SINGLE)

## 2014-01-30 ENCOUNTER — Emergency Department: Payer: Self-pay | Admitting: Emergency Medicine

## 2014-01-30 LAB — CBC
HCT: 39.2 % — ABNORMAL LOW (ref 40.0–52.0)
HGB: 12.1 g/dL — AB (ref 13.0–18.0)
MCH: 23.3 pg — ABNORMAL LOW (ref 26.0–34.0)
MCHC: 31 g/dL — AB (ref 32.0–36.0)
MCV: 75 fL — AB (ref 80–100)
PLATELETS: 256 10*3/uL (ref 150–440)
RBC: 5.21 10*6/uL (ref 4.40–5.90)
RDW: 28.1 % — ABNORMAL HIGH (ref 11.5–14.5)
WBC: 8.2 10*3/uL (ref 3.8–10.6)

## 2014-01-30 LAB — COMPREHENSIVE METABOLIC PANEL
ALT: 16 U/L
AST: 16 U/L (ref 15–37)
Albumin: 3.3 g/dL — ABNORMAL LOW (ref 3.4–5.0)
Alkaline Phosphatase: 64 U/L
Anion Gap: 9 (ref 7–16)
BUN: 38 mg/dL — ABNORMAL HIGH (ref 7–18)
Bilirubin,Total: 1.5 mg/dL — ABNORMAL HIGH (ref 0.2–1.0)
CO2: 26 mmol/L (ref 21–32)
Calcium, Total: 8.3 mg/dL — ABNORMAL LOW (ref 8.5–10.1)
Chloride: 104 mmol/L (ref 98–107)
Creatinine: 2.43 mg/dL — ABNORMAL HIGH (ref 0.60–1.30)
EGFR (African American): 37 — ABNORMAL LOW
EGFR (Non-African Amer.): 30 — ABNORMAL LOW
Glucose: 122 mg/dL — ABNORMAL HIGH (ref 65–99)
OSMOLALITY: 288 (ref 275–301)
Potassium: 3.9 mmol/L (ref 3.5–5.1)
Sodium: 139 mmol/L (ref 136–145)
TOTAL PROTEIN: 6.5 g/dL (ref 6.4–8.2)

## 2014-01-30 LAB — URINALYSIS, COMPLETE
Bacteria: NONE SEEN
Bilirubin,UR: NEGATIVE
GLUCOSE, UR: NEGATIVE mg/dL (ref 0–75)
Ketone: NEGATIVE
Nitrite: NEGATIVE
PH: 6 (ref 4.5–8.0)
Protein: >=500
Specific Gravity: 1.014 (ref 1.003–1.030)
Squamous Epithelial: <1
WBC UR: 28 /HPF (ref 0–5)

## 2014-01-30 LAB — PROTIME-INR
INR: 1.2
PROTHROMBIN TIME: 15.3 s — AB (ref 11.5–14.7)

## 2014-01-30 LAB — LIPASE, BLOOD: Lipase: 95 U/L (ref 73–393)

## 2014-02-16 ENCOUNTER — Emergency Department: Payer: Self-pay | Admitting: Emergency Medicine

## 2014-02-16 LAB — URINALYSIS, COMPLETE
BILIRUBIN, UR: NEGATIVE
Bacteria: NONE SEEN
Glucose,UR: NEGATIVE mg/dL (ref 0–75)
Hyaline Cast: 3
Ketone: NEGATIVE
NITRITE: NEGATIVE
PH: 6 (ref 4.5–8.0)
Specific Gravity: 1.012 (ref 1.003–1.030)

## 2014-02-16 LAB — CBC WITH DIFFERENTIAL/PLATELET
Basophil #: 0.1 10*3/uL (ref 0.0–0.1)
Basophil %: 1.1 %
Eosinophil #: 0.1 10*3/uL (ref 0.0–0.7)
Eosinophil %: 1 %
HCT: 40.2 % (ref 40.0–52.0)
HGB: 12.4 g/dL — ABNORMAL LOW (ref 13.0–18.0)
Lymphocyte #: 1.2 10*3/uL (ref 1.0–3.6)
Lymphocyte %: 13.9 %
MCH: 23.6 pg — AB (ref 26.0–34.0)
MCHC: 30.9 g/dL — AB (ref 32.0–36.0)
MCV: 76 fL — AB (ref 80–100)
Monocyte #: 0.6 x10 3/mm (ref 0.2–1.0)
Monocyte %: 7.2 %
Neutrophil #: 6.9 10*3/uL — ABNORMAL HIGH (ref 1.4–6.5)
Neutrophil %: 76.8 %
PLATELETS: 268 10*3/uL (ref 150–440)
RBC: 5.28 10*6/uL (ref 4.40–5.90)
RDW: 24.9 % — ABNORMAL HIGH (ref 11.5–14.5)
WBC: 9 10*3/uL (ref 3.8–10.6)

## 2014-02-16 LAB — COMPREHENSIVE METABOLIC PANEL
ALK PHOS: 75 U/L
AST: 31 U/L (ref 15–37)
Albumin: 3.3 g/dL — ABNORMAL LOW (ref 3.4–5.0)
Anion Gap: 11 (ref 7–16)
BUN: 28 mg/dL — AB (ref 7–18)
Bilirubin,Total: 0.9 mg/dL (ref 0.2–1.0)
Calcium, Total: 8.6 mg/dL (ref 8.5–10.1)
Chloride: 102 mmol/L (ref 98–107)
Co2: 26 mmol/L (ref 21–32)
Creatinine: 2.34 mg/dL — ABNORMAL HIGH (ref 0.60–1.30)
EGFR (African American): 38 — ABNORMAL LOW
GFR CALC NON AF AMER: 32 — AB
Glucose: 132 mg/dL — ABNORMAL HIGH (ref 65–99)
OSMOLALITY: 285 (ref 275–301)
POTASSIUM: 4.2 mmol/L (ref 3.5–5.1)
SGPT (ALT): 17 U/L
SODIUM: 139 mmol/L (ref 136–145)
Total Protein: 7.2 g/dL (ref 6.4–8.2)

## 2014-02-16 LAB — LIPASE, BLOOD: LIPASE: 100 U/L (ref 73–393)

## 2014-02-16 LAB — TROPONIN I: Troponin-I: 0.18 ng/mL — ABNORMAL HIGH

## 2014-02-26 ENCOUNTER — Emergency Department: Payer: Self-pay | Admitting: Emergency Medicine

## 2014-02-26 LAB — COMPREHENSIVE METABOLIC PANEL
ALK PHOS: 73 U/L
AST: 24 U/L (ref 15–37)
Albumin: 3.8 g/dL (ref 3.4–5.0)
Anion Gap: 8 (ref 7–16)
BUN: 35 mg/dL — ABNORMAL HIGH (ref 7–18)
Bilirubin,Total: 1.4 mg/dL — ABNORMAL HIGH (ref 0.2–1.0)
CALCIUM: 8.8 mg/dL (ref 8.5–10.1)
Chloride: 101 mmol/L (ref 98–107)
Co2: 28 mmol/L (ref 21–32)
Creatinine: 2.47 mg/dL — ABNORMAL HIGH (ref 0.60–1.30)
EGFR (African American): 36 — ABNORMAL LOW
GFR CALC NON AF AMER: 30 — AB
Glucose: 105 mg/dL — ABNORMAL HIGH (ref 65–99)
Osmolality: 282 (ref 275–301)
Potassium: 4 mmol/L (ref 3.5–5.1)
SGPT (ALT): 18 U/L
Sodium: 137 mmol/L (ref 136–145)
Total Protein: 7.5 g/dL (ref 6.4–8.2)

## 2014-02-26 LAB — URINALYSIS, COMPLETE
Bacteria: NONE SEEN
Bilirubin,UR: NEGATIVE
Glucose,UR: NEGATIVE mg/dL (ref 0–75)
KETONE: NEGATIVE
NITRITE: NEGATIVE
PH: 7 (ref 4.5–8.0)
Specific Gravity: 1.008 (ref 1.003–1.030)
Squamous Epithelial: 1
Transitional Epi: <1
WBC UR: 14 /HPF (ref 0–5)

## 2014-02-26 LAB — CBC WITH DIFFERENTIAL/PLATELET
Basophil #: 0.1 10*3/uL (ref 0.0–0.1)
Basophil %: 1 %
EOS PCT: 0.8 %
Eosinophil #: 0.1 10*3/uL (ref 0.0–0.7)
HCT: 44 % (ref 40.0–52.0)
HGB: 13.3 g/dL (ref 13.0–18.0)
LYMPHS ABS: 1.6 10*3/uL (ref 1.0–3.6)
Lymphocyte %: 19.2 %
MCH: 23.1 pg — AB (ref 26.0–34.0)
MCHC: 30.2 g/dL — ABNORMAL LOW (ref 32.0–36.0)
MCV: 77 fL — ABNORMAL LOW (ref 80–100)
MONO ABS: 0.6 x10 3/mm (ref 0.2–1.0)
Monocyte %: 7.3 %
NEUTROS ABS: 5.8 10*3/uL (ref 1.4–6.5)
Neutrophil %: 71.7 %
Platelet: 295 10*3/uL (ref 150–440)
RBC: 5.75 10*6/uL (ref 4.40–5.90)
RDW: 23.2 % — AB (ref 11.5–14.5)
WBC: 8.1 10*3/uL (ref 3.8–10.6)

## 2014-02-26 LAB — LIPASE, BLOOD: Lipase: 59 U/L — ABNORMAL LOW (ref 73–393)

## 2014-03-07 ENCOUNTER — Inpatient Hospital Stay: Payer: Self-pay | Admitting: Internal Medicine

## 2014-03-07 LAB — COMPREHENSIVE METABOLIC PANEL
ALBUMIN: 3.4 g/dL (ref 3.4–5.0)
ALK PHOS: 75 U/L
ALT: 20 U/L
Anion Gap: 9 (ref 7–16)
BUN: 26 mg/dL — ABNORMAL HIGH (ref 7–18)
Bilirubin,Total: 1 mg/dL (ref 0.2–1.0)
CALCIUM: 8.4 mg/dL — AB (ref 8.5–10.1)
Chloride: 103 mmol/L (ref 98–107)
Co2: 28 mmol/L (ref 21–32)
Creatinine: 2.47 mg/dL — ABNORMAL HIGH (ref 0.60–1.30)
EGFR (African American): 36 — ABNORMAL LOW
EGFR (Non-African Amer.): 30 — ABNORMAL LOW
GLUCOSE: 122 mg/dL — AB (ref 65–99)
Osmolality: 285 (ref 275–301)
Potassium: 4.4 mmol/L (ref 3.5–5.1)
SGOT(AST): 22 U/L (ref 15–37)
SODIUM: 140 mmol/L (ref 136–145)
Total Protein: 6.8 g/dL (ref 6.4–8.2)

## 2014-03-07 LAB — DRUG SCREEN, URINE
AMPHETAMINES, UR SCREEN: NEGATIVE (ref ?–1000)
BENZODIAZEPINE, UR SCRN: NEGATIVE (ref ?–200)
Barbiturates, Ur Screen: NEGATIVE (ref ?–200)
COCAINE METABOLITE, UR ~~LOC~~: NEGATIVE (ref ?–300)
Cannabinoid 50 Ng, Ur ~~LOC~~: NEGATIVE (ref ?–50)
MDMA (Ecstasy)Ur Screen: NEGATIVE (ref ?–500)
METHADONE, UR SCREEN: NEGATIVE (ref ?–300)
OPIATE, UR SCREEN: POSITIVE (ref ?–300)
Phencyclidine (PCP) Ur S: NEGATIVE (ref ?–25)
Tricyclic, Ur Screen: NEGATIVE (ref ?–1000)

## 2014-03-07 LAB — CBC
HCT: 40.1 % (ref 40.0–52.0)
HGB: 12.2 g/dL — AB (ref 13.0–18.0)
MCH: 23.2 pg — ABNORMAL LOW (ref 26.0–34.0)
MCHC: 30.5 g/dL — ABNORMAL LOW (ref 32.0–36.0)
MCV: 76 fL — ABNORMAL LOW (ref 80–100)
PLATELETS: 271 10*3/uL (ref 150–440)
RBC: 5.26 10*6/uL (ref 4.40–5.90)
RDW: 21.7 % — ABNORMAL HIGH (ref 11.5–14.5)
WBC: 11.1 10*3/uL — ABNORMAL HIGH (ref 3.8–10.6)

## 2014-03-07 LAB — APTT: ACTIVATED PTT: 34.2 s (ref 23.6–35.9)

## 2014-03-07 LAB — CK-MB
CK-MB: 5.7 ng/mL — AB (ref 0.5–3.6)
CK-MB: 5.5 ng/mL — AB (ref 0.5–3.6)
CK-MB: 4.7 ng/mL — AB (ref 0.5–3.6)

## 2014-03-07 LAB — PROTIME-INR
INR: 1.2
PROTHROMBIN TIME: 14.8 s — AB (ref 11.5–14.7)

## 2014-03-07 LAB — TROPONIN I
TROPONIN-I: 0.13 ng/mL — AB
TROPONIN-I: 0.1 ng/mL — AB
Troponin-I: 0.17 ng/mL — ABNORMAL HIGH

## 2014-03-07 LAB — PRO B NATRIURETIC PEPTIDE: B-Type Natriuretic Peptide: 4299 pg/mL — ABNORMAL HIGH (ref 0–125)

## 2014-03-07 IMAGING — CR DG CHEST 1V PORT
1 series · 2 of 2 positions shown · non-contrast
Comparison: Single view of the chest [DATE] and [DATE].

CLINICAL DATA: Left-sided chest pain beginning this morning.

EXAM:
PORTABLE CHEST - 1 VIEW

[Series 1: ap · 0.17mm/px · 2 of 2 slices shown]
[im 1/2]
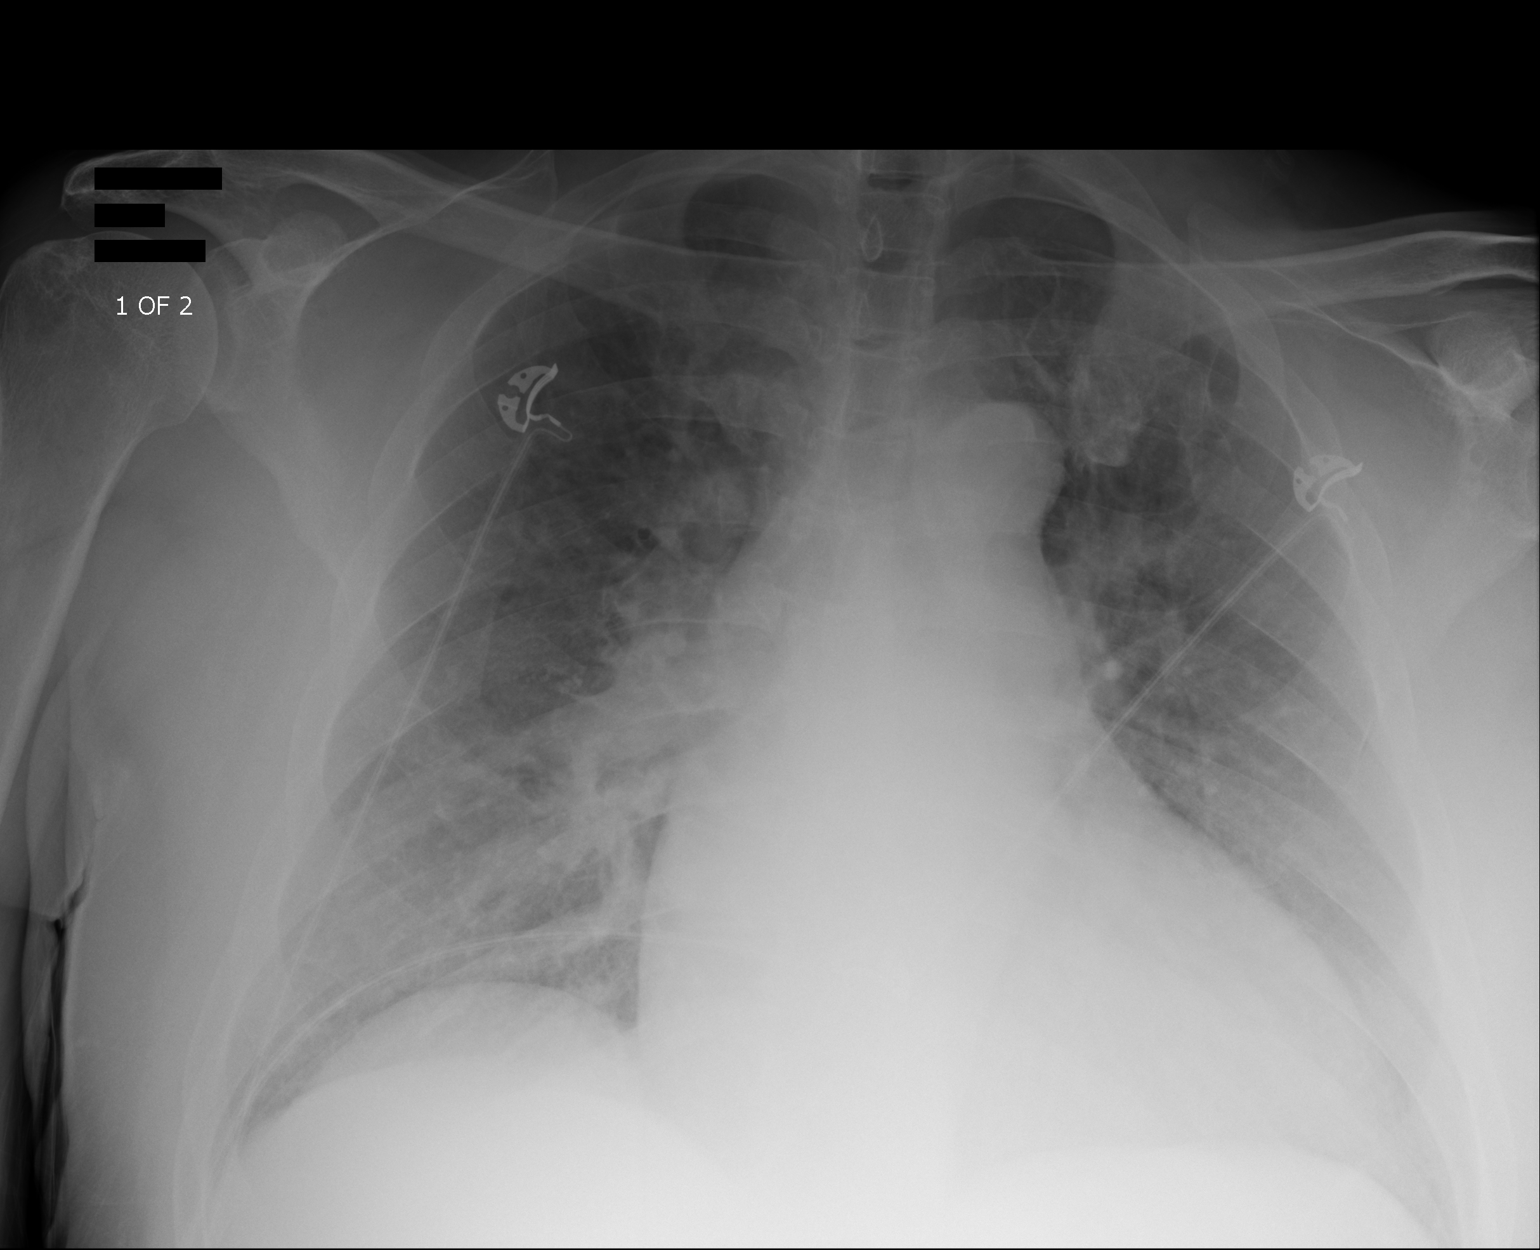
[im 2/2]
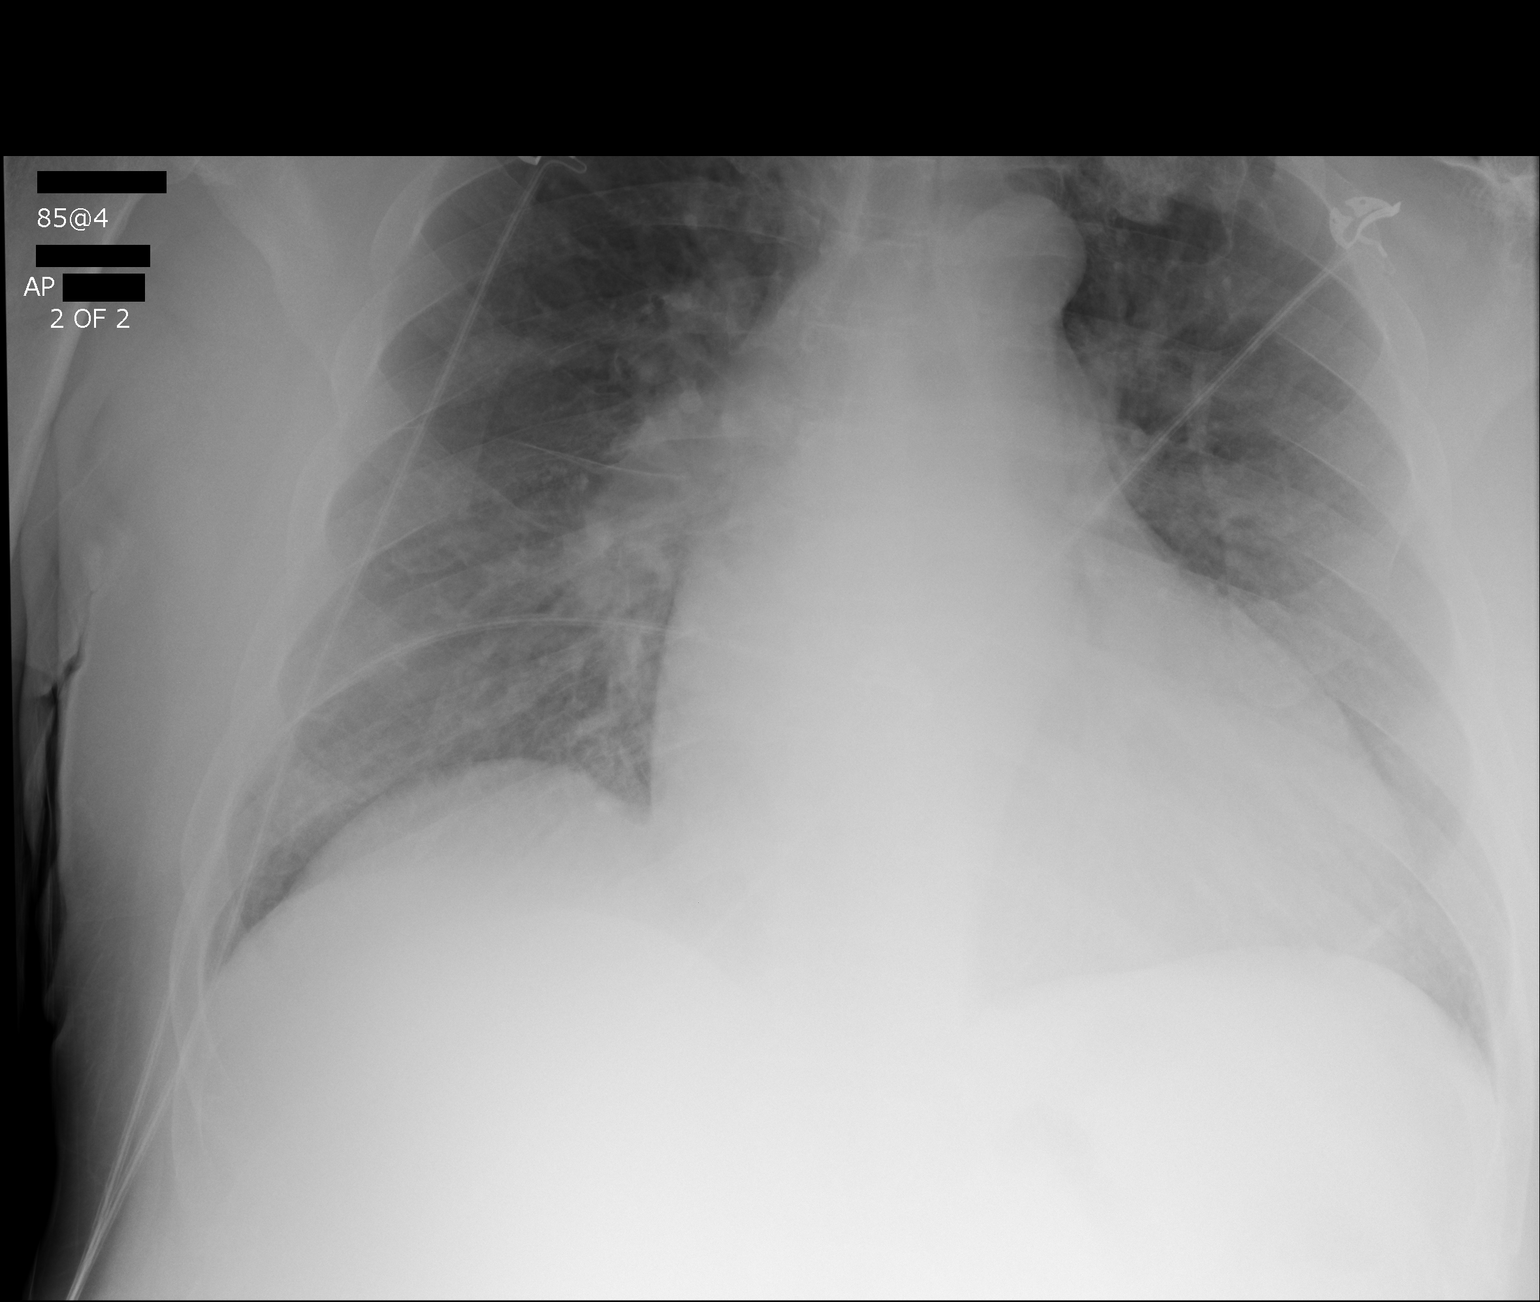

[2 of 2 positions shown; findings below may reference images not displayed]

FINDINGS: There is cardiomegaly and pulmonary vascular congestion. No
consolidative process, pneumothorax or effusion is identified.
IMPRESSION: Cardiomegaly and pulmonary vascular congestion appear unchanged. No
acute abnormality.

## 2014-03-08 LAB — CBC WITH DIFFERENTIAL/PLATELET
BASOS PCT: 0.7 %
Basophil #: 0.1 10*3/uL (ref 0.0–0.1)
EOS PCT: 1.8 %
Eosinophil #: 0.1 10*3/uL (ref 0.0–0.7)
HCT: 35.6 % — AB (ref 40.0–52.0)
HGB: 11 g/dL — AB (ref 13.0–18.0)
LYMPHS ABS: 1.1 10*3/uL (ref 1.0–3.6)
Lymphocyte %: 13.6 %
MCH: 23.6 pg — AB (ref 26.0–34.0)
MCHC: 30.9 g/dL — ABNORMAL LOW (ref 32.0–36.0)
MCV: 76 fL — AB (ref 80–100)
MONO ABS: 0.5 x10 3/mm (ref 0.2–1.0)
MONOS PCT: 6.2 %
NEUTROS ABS: 6.3 10*3/uL (ref 1.4–6.5)
Neutrophil %: 77.7 %
Platelet: 223 10*3/uL (ref 150–440)
RBC: 4.65 10*6/uL (ref 4.40–5.90)
RDW: 21.6 % — ABNORMAL HIGH (ref 11.5–14.5)
WBC: 8 10*3/uL (ref 3.8–10.6)

## 2014-03-08 LAB — BASIC METABOLIC PANEL
Anion Gap: 7 (ref 7–16)
BUN: 26 mg/dL — AB (ref 7–18)
CO2: 27 mmol/L (ref 21–32)
Calcium, Total: 7.9 mg/dL — ABNORMAL LOW (ref 8.5–10.1)
Chloride: 101 mmol/L (ref 98–107)
Creatinine: 2.38 mg/dL — ABNORMAL HIGH (ref 0.60–1.30)
EGFR (Non-African Amer.): 31 — ABNORMAL LOW
GFR CALC AF AMER: 38 — AB
Glucose: 106 mg/dL — ABNORMAL HIGH (ref 65–99)
Osmolality: 275 (ref 275–301)
Potassium: 3.9 mmol/L (ref 3.5–5.1)
SODIUM: 135 mmol/L — AB (ref 136–145)

## 2014-03-08 LAB — LIPID PANEL
Cholesterol: 118 mg/dL (ref 0–200)
HDL: 28 mg/dL — AB (ref 40–60)
Ldl Cholesterol, Calc: 71 mg/dL (ref 0–100)
TRIGLYCERIDES: 95 mg/dL (ref 0–200)
VLDL CHOLESTEROL, CALC: 19 mg/dL (ref 5–40)

## 2014-03-08 LAB — TSH: THYROID STIMULATING HORM: 2.46 u[IU]/mL

## 2014-03-27 ENCOUNTER — Ambulatory Visit: Payer: Self-pay | Admitting: Family

## 2014-03-28 ENCOUNTER — Encounter: Payer: Self-pay | Admitting: Cardiology

## 2014-03-28 ENCOUNTER — Encounter: Payer: Self-pay | Admitting: *Deleted

## 2014-03-28 ENCOUNTER — Ambulatory Visit (INDEPENDENT_AMBULATORY_CARE_PROVIDER_SITE_OTHER): Payer: Medicaid Other | Admitting: Cardiology

## 2014-03-28 ENCOUNTER — Encounter (INDEPENDENT_AMBULATORY_CARE_PROVIDER_SITE_OTHER): Payer: Self-pay

## 2014-03-28 VITALS — BP 112/88 | HR 62 | Ht 71.0 in | Wt 238.0 lb

## 2014-03-28 DIAGNOSIS — R079 Chest pain, unspecified: Secondary | ICD-10-CM

## 2014-03-28 DIAGNOSIS — I10 Essential (primary) hypertension: Secondary | ICD-10-CM

## 2014-03-28 DIAGNOSIS — E785 Hyperlipidemia, unspecified: Secondary | ICD-10-CM

## 2014-03-28 DIAGNOSIS — I426 Alcoholic cardiomyopathy: Secondary | ICD-10-CM

## 2014-03-28 DIAGNOSIS — I5042 Chronic combined systolic (congestive) and diastolic (congestive) heart failure: Secondary | ICD-10-CM

## 2014-03-28 MED ORDER — FUROSEMIDE 20 MG PO TABS: 20.0000 mg | ORAL_TABLET | Freq: Every day | ORAL | Status: DC

## 2014-03-28 NOTE — Patient Instructions (Addendum)
Your physician has recommended you make the following change in your medication:  Use sliding scale Lasix as discussed with Dr. Ellyn Hack  If you gain 3 or more pounds take 40 mg of Lasix (2 tablets)  If you gain 5 or more pounds take 40 mg of Lasix in the morning (2 tablets) and 20 mg at night (1 tablet)  If you are at your dry weight take 20 mg (1 tablet) once daily   Your physician recommends that you schedule a follow-up appointment in:  4 months   Weigh your yourself at the same time every day before you eat after you urinate

## 2014-03-30 ENCOUNTER — Encounter: Payer: Self-pay | Admitting: Cardiology

## 2014-03-30 DIAGNOSIS — I5042 Chronic combined systolic (congestive) and diastolic (congestive) heart failure: Secondary | ICD-10-CM | POA: Insufficient documentation

## 2014-03-30 DIAGNOSIS — E785 Hyperlipidemia, unspecified: Secondary | ICD-10-CM | POA: Insufficient documentation

## 2014-03-30 DIAGNOSIS — R079 Chest pain, unspecified: Secondary | ICD-10-CM | POA: Insufficient documentation

## 2014-03-30 DIAGNOSIS — E782 Mixed hyperlipidemia: Secondary | ICD-10-CM | POA: Insufficient documentation

## 2014-03-30 DIAGNOSIS — I119 Hypertensive heart disease without heart failure: Secondary | ICD-10-CM | POA: Insufficient documentation

## 2014-03-30 DIAGNOSIS — I426 Alcoholic cardiomyopathy: Secondary | ICD-10-CM | POA: Insufficient documentation

## 2014-03-30 NOTE — Assessment & Plan Note (Addendum)
Well compensated currently. He is not on Lasix. I may start him on low-dose standing Lasix. Use sliding scale Lasix as discussed with Dr. Ellyn Hack  If you gain 3 or more pounds take 40 mg of Lasix (2 tablets)  If you gain 5 or more pounds take 40 mg of Lasix in the morning (2 tablets) and 20 mg at night (1 tablet)  If you are at your dry weight take 20 mg (1 tablet) once daily   Otherwise, currently on appropriate regimen with good blood pressure control. As indicated above, preferred to use standard after reducing agents

## 2014-03-30 NOTE — Assessment & Plan Note (Signed)
This is probably all related to hypertension. I will see how he does on good medical management. He'll probably need to get renal Dopplers performed as well as a stress test performed. I want to ensure that you have stable followup with [place] and then we can proceed with further evaluation.

## 2014-03-30 NOTE — Progress Notes (Addendum)
PATIENT: Hector [name] MRN: [id number] DOB: 05/31/1965 PCP: No PCP Per Patient  Clinic Note: Chief Complaint  Patient presents with  . Follow-up    F/u [facility] due to chest pain. Pt recently visited heart failure clinic and was prescribed new heart med.  Meds reviewed verbally with pt.  . Cardiomyopathy  . Hypertension    HPI: Hector [name] is a 49 y.o. 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 He is a relatively pleasant gentleman with a long-standing history of chronic alcoholism in the past. 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 Apparently he stopped drinking about 2 years ago. He is current on was complicated by a poorly controlled/hypertension resulting chronic kidney disease with baseline creatinine approximately 1.8. He also has hyperlipidemia. He was admitted to [facility] on January 13 chest pain and accelerated hypertension. He had mild troponin elevation. The chest pain episode was thought to be related to accelerated hypertension with the patient being an outpatient stress test. He was actually supposed to followup with Dr. Humphrey Rolls in [place], but was referred to our clinic instead.  He did a cardiac in the hospital this showed an ejection fraction of 20-25% with impaired relaxation severely dilated left atrium with mild to moderate MR.  He also had mild to moderate TR with mildly estimated RV systolic pressures. He was aggressively diuresed and blood pressure managed. He was chest pain-free prior to discharge and doing well. He was actually seen in the heart failure clinic yesterday and was started on medicine he is not sure [email or web address]/, he is not on a standing dose of Lasix.  Interval History: since his discharge, he says he is  doing quite well. He has not had any more episodes of chest tightness or pressure. He still sleeps on about 2 pillows but that is essentially his baseline. He has not had any resting or exertional angina. He doesn't really note much the way of exertional dyspnea or edema. No PND. No rapid or irregular heartbeat/palpitations, syncope/near syncope, TIA/amaurosis fugax..   Past Medical History  Diagnosis Date  . Hyperlipidemia   . Elevated troponin     Chronically elevated. - level was 0.17-0.10 during recent admission  . Alcoholic cardiomyopathy [id number]    a) Myoview: 11/'09: EF 28% with globally decreased function. No inducible ischemia, fixed inferoapical defect = Infarct vs.artifact (not commensurate with extent of reduced EF); b) Echo 8/'12: Mild LV dilation, Mod-Severe global hypokinesis - EF 25-35%. Mild-moderate RV dilation no significant bowel lesion mild-moderate MR; c) Echo Jan 2016: EF 20-25%, high LAP, mild-moderate LV dilation, severe L  . Chronic combined systolic and diastolic heart failure, NYHA class 2   . Alcohol abuse   . Essential hypertension   . CKD (chronic kidney disease)     Prior Cardiac Evaluation and Past Surgical History: Past Surgical History  Procedure Laterality Date  . Knee surgery    . Nm myoview ltd  November 2011    No ischemia or infarction. EF 50-55% ( no improvement from 2009 Myoview EF of 28%    Allergies  Allergen Reactions  . Eggs Or Egg-Derived Products     Current Outpatient Prescriptions  Medication Sig Dispense Refill  . aspirin 81 MG tablet Take 81 mg by mouth daily.    Marland Kitchen atorvastatin (LIPITOR) 80 MG tablet Take 80 mg  by mouth daily.    . carvedilol (COREG) 25 MG tablet Take 25 mg by mouth 2 (two) times daily with a meal.    . cloNIDine (CATAPRES) 0.2 MG tablet Take 0.2 mg by mouth 2 (two) times daily.    . isosorbide mononitrate (IMDUR) 60 MG 24 hr tablet Take 60 mg by mouth daily.    Marland Kitchen lisinopril (PRINIVIL,ZESTRIL) 40 MG tablet Take 40  mg by mouth daily.    . ranitidine (ZANTAC) 150 MG tablet Take 150 mg by mouth 2 (two) times daily.    . furosemide (LASIX) 20 MG tablet Take 1 tablet (20 mg total) by mouth daily. 30 tablet 6   No current facility-administered medications for this visit.   History  Substance Use Topics  . Smoking status: Never Smoker   . Smokeless tobacco: Not on file  . Alcohol Use: No     Comment: 4 heavy alcohol drinker. Quit 2 years ago.    family history includes Hyperlipidemia in his mother; Hypertension in his mother.  ROS: A comprehensive Review of Systems - was performed Review of Systems  Constitutional: Negative.  Negative for weight loss and malaise/fatigue.  HENT: Negative for nosebleeds.   Respiratory: Negative for cough, shortness of breath and wheezing.   Cardiovascular: Negative for claudication.  Gastrointestinal: Negative for blood in stool and melena.  Genitourinary: Negative for hematuria.  Musculoskeletal: Positive for joint pain. Negative for myalgias and falls.  Neurological: Negative for dizziness and seizures.  Endo/Heme/Allergies: Does not bruise/bleed easily.  Psychiatric/Behavioral: Negative for depression and memory loss. The patient is not nervous/anxious.   All other systems reviewed and are negative.  PHYSICAL EXAM BP 112/88 mmHg  Pulse 62  Ht 5\' 11"  (1.803 m)  Wt 238 lb (107.956 kg)  BMI 33.21 kg/m2 Physical Exam  Constitutional: He is oriented to person, place, and time. He appears well-developed and well-nourished. No distress.  HENT:  Head: Normocephalic.  Mouth/Throat: Oropharynx is clear and moist. No oropharyngeal exudate.  Status post nose fracture - and some facial asymmetry.  Eyes: Conjunctivae and EOM are normal. Pupils are equal, round, and reactive to light. No scleral icterus.  Neck: Normal range of motion. Neck supple. No hepatojugular reflux and no JVD present. Carotid bruit is not present.  Cardiovascular: Normal rate, regular rhythm,  intact distal pulses and normal pulses.  PMI is displaced (Nonsustained).  Exam reveals gallop and S4.   Murmur heard. High-pitched blowing holosystolic murmur is present with a grade of 2/6  at the apex Musculoskeletal: Normal range of motion. He exhibits no edema.  Neurological: He is alert and oriented to person, place, and time. He has normal reflexes. No cranial nerve deficit.  Skin: Skin is warm and dry. No rash noted. He is not diaphoretic.  Psychiatric: He has a normal mood and affect. His behavior is normal. Judgment and thought content normal.    Adult ECG Report  Rate: 62 ;  Rhythm: normal sinus rhythm and premature atrial contractions (PAC)   Other Abnormalities: Anterolateral T-wave changes - cannot exclude ischemia.  Narrative Interpretation: essentially stable EKG when compared to his July 20 15th October 20 15th and December 2015 EKGs.  Recent Labs: from [facility] admission:  Troponin levels went from 0.17-0.13 down to 0.1 on discharge; BNP 4299  Sodium 135,  Potassium 3.9, chloride 101, bicarbonate 27, BUN 26, creatinine 2.38, glucose 16, calcium 7.9.  TC 118, TG 95, HDL 28, LDL 71  ASSESSMENT / PLAN: Very pleasant relatively young gentleman with  recurrent cardiomyopathy, with relatively poorly controlledhypertension and now low EF and the 25% range. He is already on pretty good regimens including high-dose carvedilol and lisinopril along with Imdur. I am not sure why he is on clonidine and not for instance Norvasc and hydralazine. In the future he probably wouldn't be someone who would benefit from [facility], if he were able to get it for a goodcost.  Pain in the chest This is probably all related to hypertension. I will see how he does on good medical management. He'll probably need to get renal Dopplers performed as well as a stress test performed. I want to ensure that you have stable followup with [place] and then we can proceed with further evaluation.   Chronic combined  systolic and diastolic heart failure, NYHA class 2 Well compensated currently. He is not on Lasix. I may start him on low-dose standing Lasix. Use sliding scale Lasix as discussed with Dr. Ellyn Hack  If you gain 3 or more pounds take 40 mg of Lasix (2 tablets)  If you gain 5 or more pounds take 40 mg of Lasix in the morning (2 tablets) and 20 mg at night (1 tablet)  If you are at your dry weight take 20 mg (1 tablet) once daily   Otherwise, currently on appropriate regimen with good blood pressure control. As indicated above, preferred to use standard after reducing agents   Essential hypertension He has a history of probably labile blood pressures. Will need renal Dopplers - they can be ordered followup Would like to get rid of the clonidine. But for now maintain stability.   Hyperlipidemia Vascular control on high-dose atorvastatin. Myelination within the heavy drinker will be to back off to 40 mg 1 see him in return.   Alcoholic cardiomyopathy He's had several ischemic evaluations in the past. Within having an episode of chest pain we can consider rechecking Myoview. I did not order one at present, as he thinks he may have had one ordered that is still pending. Until we're sure that when I see him back, I don't want to double up order.    Orders Placed This Encounter  Procedures  . EKG 12-Lead    Order Specific Question:  Where should this test be performed    Answer:  [facility]   Meds ordered this encounter  Medications  . furosemide (LASIX) 20 MG tablet    Sig: Take 1 tablet (20 mg total) by mouth daily.    Dispense:  30 tablet    Refill:  6    Followup: 4 months  Bessy Reaney W. Ellyn Hack, M.D., M.S. Interventional Cardiolgy [facility]

## 2014-03-31 NOTE — Addendum Note (Signed)
Addended by: [name] on: 03/31/2014 12:04 AM   Modules accepted: Miquel Dunn

## 2014-03-31 NOTE — Assessment & Plan Note (Signed)
He's had several ischemic evaluations in the past. Within having an episode of chest pain we can consider rechecking Myoview. I did not order one at present, as he thinks he may have had one ordered that is still pending. Until we're sure that when I see him back, I don't want to double up order.

## 2014-03-31 NOTE — Assessment & Plan Note (Signed)
Vascular control on high-dose atorvastatin. Myelination within the heavy drinker will be to back off to 40 mg 1 see him in return.

## 2014-03-31 NOTE — Assessment & Plan Note (Signed)
He has a history of probably labile blood pressures. Will need renal Dopplers - they can be ordered followup Would like to get rid of the clonidine. But for now maintain stability.

## 2014-04-10 ENCOUNTER — Ambulatory Visit: Payer: Self-pay | Admitting: Family

## 2014-04-16 ENCOUNTER — Ambulatory Visit: Payer: Self-pay | Admitting: Family

## 2014-04-16 ENCOUNTER — Encounter: Payer: Self-pay | Admitting: Cardiovascular Disease

## 2014-05-15 ENCOUNTER — Ambulatory Visit: Payer: Self-pay | Admitting: Family

## 2014-06-12 NOTE — Consult Note (Signed)
PATIENT NAME:  Hector [name], Hector [name] MR#:  [id number] DATE OF BIRTH:  November 16, 1965  DATE OF CONSULTATION:  09/21/2011  REFERRING PHYSICIAN:  Pernell Dupre, MD CONSULTING PHYSICIAN:  Huey Romans, MD  HISTORY OF PRESENT ILLNESS: The patient is a 49 year old African American male who presented to the Emergency Room with vomiting 5 to 6 times this morning. He had a nosebleed and had fresh blood coming from his nose as well as blood in his vomit and spitting out some blood. His blood pressure was out of control and he also is on aspirin therapy. He was nauseated so he was not taking his blood pressure medication, which was making his blood pressure go up. He was admitted to the hospital. His blood pressure was controlled and his bleeding stopped spontaneously earlier in the day. He has not had any further bleeding. He states he gets some nosebleeds now and then, maybe once or twice a week sometimes, but it does not bleed a long time, with a little cool rag on his head it seems to stop on its own. He has never had a long one or had his nose packed to try to control bleeding.   PAST MEDICAL HISTORY:  1. Alcohol-induced cardiomyopathy with low ejection fraction. 2. Congestive heart failure. 3. Alcohol abuse. 4. Malignant hypertension.  5. Chronic kidney disease.  6. Hyperlipidemia.   PAST SURGICAL HISTORY: He has had surgery on his right knee.  DRUG ALLERGIES: None known.   CURRENT MEDICATIONS: As noted in the chart and reviewed. He is not on aspirin therapy as of today.   SOCIAL HISTORY: He has a history of alcohol abuse daily and had elevated alcohol level when he came in this morning. He denies smoking or drug abuse. He is on disability.   FAMILY HISTORY: His mother had diabetes and father died of unknown cause.    PHYSICAL EXAMINATION: The patient is awake and alert, very cooperative. His ears are relatively clear. His nose is opened. I do not see any significant scabs from the nose on  either side. His septum is a little bit deviated. There is no evidence for significant bleeding, but you can see some prominent blood vessels anteriorly that appear to have been the bleeding site. The oropharynx is very clear. There is no sign of oral lesions. Posterior pharynx is clear. There is no sign of blood staining here whatsoever. Neck is negative for thyromegaly and no nodes or masses that I can feel.   ASSESSMENT AND RECOMMENDATIONS: The patient had uncontrolled hypertension, on aspirin therapy and vomiting increasing his venous pressure in his head. He had a nosebleed that stopped spontaneously. He does not need any cauterization at this point. He however could use some lubrication in his nose periodically at home with some Vaseline ointment to help lubricate the front of his nose. He is off aspirin therapy right now, but once his blood pressure remains controlled he can go back on his aspirin therapy and his nosebleed should be hopefully controlled well enough. Humidification will help him if he will do that at home, especially during the winter months. If he has continued further bleeds, he should be seen in the office for further evaluation. ____________________________ Huey Romans, MD phj:slb D: 09/21/2011 20:17:24 ET T: 09/22/2011 10:29:34 ET JOB#: [id number]  cc: Huey Romans, MD, <Dictator> Huey Romans MD ELECTRONICALLY SIGNED 09/24/2011 7:39

## 2014-06-12 NOTE — H&P (Signed)
PATIENT NAME:  Hector [name], Hector [name] MR#:  [id number] DATE OF BIRTH:  05-31-1965  DATE OF ADMISSION:  09/21/2011  PRIMARY CARE PHYSICIAN: Kizzie Furnish. Prudence Davidson, PA at Dr. Bailey Mech office   CHIEF COMPLAINT: Vomiting 5 to 6 times.   HISTORY OF PRESENT ILLNESS: The patient is a 49 year old man who presents to the Emergency Room with vomiting 5 to 6 times this morning. He also had a nosebleed, and there was blood in the vomit, nosebleed out the front, most likely out the back. Currently he feels okay. He last vomited this morning, none here in the Emergency Room. He is currently drinking a few sips of ginger ale.  He developed chest pain after the vomiting, felt like an ache and is still going on now, 4 out of 10 in intensity. His heart was beating fast. He developed some shortness of breath and palpitations. He has been coughing a little bit and also developed some abdominal pain, sharp in nature in the lower abdomen. Nothing made the pain better or worse. In the Emergency Room he was found to have accelerated hypertension from vomiting up medications. His troponin was found to be borderline, and Hospitalist Services were contacted for further evaluation.   PAST MEDICAL HISTORY:  1. Alcohol-induced cardiomyopathy with an ejection fraction of 25 to 35%.  2. Congestive heart failure. 3. Alcohol abuse. 4. Malignant hypertension. 5. Chronic kidney disease. 6. Hyperlipidemia. 7. Chronically elevated troponin.   PAST SURGICAL HISTORY: Past surgical history on the right knee.   ALLERGIES: No known drug allergies.   MEDICATIONS: As per patient:  1. Lisinopril 40 mg daily.  2. Lasix 40 mg daily.  3. Imdur 60 mg daily.  4. Hydralazine 25 mg t.i.d.  5. Coreg 25 mg b.i.d.  6. Lipitor 80 mg daily.  7. Aspirin 81 mg daily   SOCIAL HISTORY: No smoking, drinks a pint of gin a day plus two to three 40s a day. No smoking. No drug use. Positive for alcohol. He is on Disability.   FAMILY HISTORY: Mother with  diabetes, father died of unknown cause.    REVIEW OF SYSTEMS: CONSTITUTIONAL: Positive for chills. Positive for weakness. No fever. EYES: He does wear glasses. EARS, NOSE, MOUTH, AND THROAT: Positive for sore throat. No difficulty swallowing. Positive for a nose bleed. CARDIOVASCULAR: Positive for chest pain. Positive for palpitations. RESPIRATORY: Positive for shortness breath. Positive for cough. RESPIRATORY: Positive for shortness of breath. Positive for cough. No hemoptysis. GASTROINTESTINAL: Positive for nausea. Positive for vomiting, did have blood in there with the nosebleed. Positive for abdominal pain. No bright red blood per rectum. No melena. No diarrhea, no constipation. GENITOURINARY: No burning on urination or hematuria. MUSCULOSKELETAL: Positive for cramps. INTEGUMENTARY: No rashes or eruptions. NEUROLOGICAL: No fainting or blackouts. PSYCHIATRIC: No anxiety or depression. ENDOCRINE: No thyroid problems. HEMATOLOGIC/LYMPHATIC: No anemia, no easy bruising or bleeding.   PHYSICAL EXAMINATION:  VITAL SIGNS: Temperature 98.6, pulse 95, respirations 20, blood pressure 170/135, pulse oximetry 97%.   GENERAL: No respiratory distress.   HEENT: Eyes: Conjunctivae and lids normal. Pupils are equal, round, and reactive to light. Extraocular muscles are intact. No nystagmus. Ears, nose, mouth, and throat: Nasal mucosa positive for red blood in there. No active bleeding. Tympanic membranes: No erythema. Throat: No erythema. No exudate seen. Lips and gums: No lesions.   NECK: No JVD. No bruits. No lymphadenopathy. No thyromegaly. No thyroid nodules palpated.   LUNGS: Lungs are clear to auscultation. No use of accessory muscles to breathe. No rhonchi,  rales, or wheeze heard.  CARDIOVASCULAR: S1 and S2 normal. No gallops, rubs, or murmurs heard. Carotid upstroke 2+ bilaterally. No bruits.   EXTREMITIES: Dorsalis pedis pulses 2+ bilaterally. All pulses are equal throughout upper and lower extremities,  1+ edema of the lower extremity.   ABDOMEN: Soft. Slight tenderness in the lower abdomen. No organomegaly/splenomegaly. Normoactive bowel sounds.   LYMPHATIC: No lymph nodes in the neck.   MUSCULOSKELETAL: 1+ edema. No clubbing. No cyanosis.   SKIN: No ulcers or lesions seen.   NEUROLOGIC: Cranial nerves II through XII are grossly intact. Deep tendon reflexes are 2+ bilateral lower extremities.   PSYCHIATRIC: The patient is oriented to person, place, and time.   LABORATORY, DIAGNOSTIC AND RADIOLOGICAL DATA: Chest x-ray: No acute disease. Urinalysis: 2+ leukocyte esterase, 2+ blood. White blood cell count 5.5, hemoglobin and hematocrit 15.1 and 43.8, platelet count of 167. Glucose 120, BUN 15, creatinine 1.51, sodium 140, potassium 4.0, chloride 104, CO2 23, calcium 8.5. Liver function tests: AST elevated at 106. Lipase normal. Ethanol level 43, CPK 859. Troponin borderline at 0.15. INR normal range. EKG: Normal sinus rhythm, left atrial enlargement, left ventricular hypertrophy, QT 394.   ASSESSMENT AND PLAN:  1. Malignant hypertension: Most likely with vomiting this morning unable to take medications. We will restart medications. We will use nitroglycerin patch instead of Imdur. We will give a stat dose of hydralazine 100 mg x1 and lisinopril and continue usual medications and monitor from there.  2. Nausea and vomiting with epistaxis: We will get an ENT consultation. Hold aspirin at this point, control blood pressure, p.r.n. nausea medications. We will put on IV Protonix. With the patient's history of alcohol abuse, need to be careful here.  I do think the bleeding is from the nose, rather than the gastrointestinal tract, but I will guaiac stools, and get serial hemoglobins, and place on Protonix.  3. Chest pain with history of cardiomyopathy, with low ejection fraction, with chronically elevated troponin: I will send serial troponins. I will continue Coreg. Aspirin is contraindicated with a  nosebleed. I will put on telemetry.  4. Alcohol abuse: He does get shaky if he does not drink. We will put on CIWA protocol. Alcohol cessation advised.  5. Hyperlipidemia:  Continue Lipitor.  6. Chronic kidney disease: Creatinine actually better today than was previously.  7. Possible urinary tract infection: We will put on Rocephin and send off a urine culture.   TIME SPENT ON ADMISSION: 55 minutes.   ____________________________ Tana Conch. Leslye Peer, MD rjw:cbb D: 09/21/2011 10:38:38 ET T: 09/21/2011 11:43:58 ET JOB#: [id number]  cc: Tana Conch. Leslye Peer, MD, <Dictator> Kizzie Furnish. Prudence Davidson, [place] Marisue Brooklyn MD ELECTRONICALLY SIGNED 09/21/2011 17:20

## 2014-06-12 NOTE — Discharge Summary (Signed)
PATIENT NAME:  Hector [name], Hector [name] MR#:  [id number] DATE OF BIRTH:  1965/09/28  DATE OF ADMISSION:  09/21/2011 DATE OF DISCHARGE:  09/25/2011  DISCHARGE DIAGNOSES:  1. Malignant hypertension.  2. Acute diastolic congestive heart failure.  3. Medication noncompliance.  4. Urinary tract infection.  5. Chest pain.   DISCHARGE MEDICATIONS: The patient is to resume home medications. Additional medication is Augmentin 500 mg x7 days.   DIET: Low sodium.   ACTIVITY: As tolerated.   DISCHARGE FOLLOWUP: Followup with Dr. Pernell Dupre in 1 to 2 weeks.   HOSPITAL COURSE: [name] is a 49 year old male who presents through the Emergency Room with acute shortness of breath and malignantly elevated hypertension. The patient admitted to noncompliance with medical therapy. The patient was admitted to a monitored floor. He was placed on appropriate antihypertensives with normalization of his blood pressure within 24 hours. He was managed with intravenous diuretics and beta blockade to which he responded quite well. On admission his troponin was mildly elevated. He does have a history of chronically elevated troponin, although EKG did not show any acute ST or T wave changes. He did have a few episodes of chest pain during his stay which was relieved by sublingual nitroglycerin. We obtained a cardiology consult with Dr. Lujean Amel who evaluated the patient and did not believe that cardiac catheterization or other intervention is required at this point. The patient is pain free on the day of admission, fully ambulatory, and on room air oxygen.      DISPOSITION: Home in satisfactory condition.   DISCHARGE PROCESS TIME SPENT: 36 minutes. ____________________________ Venetia Maxon Elijio Miles, MD sat:slb D: 09/25/2011 13:26:17 ET T: 09/25/2011 14:43:58 ET JOB#: [id number]  cc: Sheikh A. Elijio Miles, MD, <Dictator> Veverly Fells MD ELECTRONICALLY SIGNED 10/14/2011 12:47

## 2014-06-15 ENCOUNTER — Other Ambulatory Visit: Payer: Self-pay | Admitting: Family

## 2014-06-16 NOTE — Consult Note (Signed)
Chief Complaint:  Subjective/Chief Complaint Pt reports intermittent epigastric pain 6/10 but better with pain meds.  "I want to eat"  Denies nausea or vomiting.  Denies rectal bleeding or melena as reports no BM since admission.   VITAL SIGNS/ANCILLARY NOTES: **Vital Signs.:   02-Oct-15 08:24  Vital Signs Type Routine  Temperature Temperature (F) 98.2  Celsius 36.7  Pulse Pulse 73  Respirations Respirations 20  Systolic BP Systolic BP 924  Diastolic BP (mmHg) Diastolic BP (mmHg) 99  Mean BP 116  Pulse Ox % Pulse Ox % 93  Pulse Ox Activity Level  At rest  Oxygen Delivery Room Air/ 21 %   Brief Assessment:  GEN well developed, well nourished, no acute distress, A/Ox3   Cardiac Regular   Respiratory normal resp effort   Gastrointestinal Normal   Gastrointestinal details normal Soft   EXTR negative cyanosis/clubbing, negative edema   Additional Physical Exam Skin: warm, dry, intact   Lab Results: Hepatic:  02-Oct-15 06:49   Bilirubin, Total  1.5  Bilirubin, Direct  0.6 (Result(s) reported on 24 Nov 2013 at 07:30AM.)  Alkaline Phosphatase 52 (46-116 NOTE: New Reference Range 09/12/13)  SGPT (ALT) 18 (14-63 NOTE: New Reference Range 09/12/13)  SGOT (AST) 20  Total Protein, Serum  5.7  Albumin, Serum  2.9  Routine Chem:  02-Oct-15 06:49   Result Comment HGB/HCT - RESULTS VERIFIED BY REPEAT TESTING.  Result(s) reported on 24 Nov 2013 at 07:29AM.  Glucose, Serum 90  BUN  23  Creatinine (comp)  2.38  Sodium, Serum 138  Potassium, Serum 3.7  Chloride, Serum 104  CO2, Serum 26  Calcium (Total), Serum  7.9  Anion Gap 8  Osmolality (calc) 279  eGFR (African American)  38  eGFR (Non-African American)  31 (eGFR values <50m/min/1.73 m2 may be an indication of chronic kidney disease (CKD). Calculated eGFR, using the MRDR Study equation, is useful in  patients with stable renal function. The eGFR calculation will not be reliable in acutely ill patients when serum  creatinine is changing rapidly. It is not useful in patients on dialysis. The eGFR calculation may not be applicable to patients at the low and high extremes of body sizes, pregnant women, and vetetarians.)  Routine Hem:  02-Oct-15 06:49   WBC (CBC) 7.7  RBC (CBC) 4.50  Hemoglobin (CBC)  8.5  Hematocrit (CBC)  29.1  Platelet Count (CBC) 306  MCV  65  MCH  18.9  MCHC  29.3  RDW  21.1  Neutrophil % 68.2  Lymphocyte % 19.2  Monocyte % 7.8  Eosinophil % 3.7  Basophil % 1.1  Neutrophil # 5.3  Lymphocyte # 1.5  Monocyte # 0.6  Eosinophil # 0.3  Basophil # 0.1   Assessment/Plan:  Assessment/Plan:  Assessment Epigastric pain/hematemesis:  Resolved.  Likely Mallory Weiss tear, less likely PUD or erosive esophagitis both of which are treated with BID PPI for 3 months given recent EGD without evidence of malignancy, varices or bleeding.  Alkaseltzer, Aleve & ASA have placed him at risk for GI bleed.   Chronic microcytic anemia & anemia of chronic disease: Stable Hemorrhoids: Stable Gilbert's syndrome:  No further workup of indirect hyperbilirubinemia I [facility] our plan of care is below.   Plan 1) Change protonix to 460mPO BID & continue for 3 months 2)  Avoid NSAIDS & alkaseltzers 3) Carafate 1 g QID 4) Advance to bland, low fat, low cholesterol, anti-reflux diet as tolerated 5)  Anusol HC BID for hemorrhoidal bleeding x 10 days 6) continue supportive measures Please call if you have any questions or concerns. Dr Allen Norris to cover over weekend.   Electronic Signatures: Andria Meuse (NP)  (Signed 02-Oct-15 11:08)  Authored: Chief Complaint, VITAL SIGNS/ANCILLARY NOTES, Brief Assessment, Lab Results, Assessment/Plan   Last Updated: 02-Oct-15 11:08 by Andria Meuse (NP)

## 2014-06-16 NOTE — Consult Note (Signed)
PATIENT NAME:  Hector [name], Hector [name] MR#:  [id number] DATE OF BIRTH:  1965-12-09  DATE OF CONSULTATION:  08/25/2013  REFERRING PHYSICIAN:   CONSULTING PHYSICIAN:  Manya Silvas, MD  HISTORY OF PRESENT ILLNESS:  The patient is a 49 year old black male who complains of rectal bleeding. He had the onset yesterday of about 4 bloody passages in the afternoon and about 4 more during the night.  He was seen in the ER and admitted to the hospital and I was asked to see him in consultation.   The patient has never had a colonoscopy before.   The patient has a history of congestive heart failure, probably secondary to alcohol-induced cardiomyopathy. The patient's last ejection fraction was 25%-35%. He also has a history of previous alcohol liver disease and had a bilirubin up to 8 or 9 in 2010.  At that time, he also had ejection fraction of about 25%. He was admitted for a few days and seen by Dr. Dionne Milo consultation.   FAMILY HISTORY: Mother with diabetes. Father died when the patient was very young. He does not know his medical history.  PAST SURGICAL HISTORY: Right knee surgery, not a replaced, but the surgery.   ALLERGIES: NO KNOWN DRUG ALLERGIES.   MEDICATIONS: That he is supposed to be on: Tramadol 50 mg one 2 times a day p.r.n., potassium chloride 20 mEq one a day, lisinopril 40 mg daily, Lasix 40 mg daily, Isordil 60 mg one a day, hydralazine 25 mg 3 times a day, Coreg 25 mg b.i.d., atorvastatin 80 mg a day, and aspirin 81 mg a day.   REVIEW OF SYSTEMS: The patient says that he first passed bright red blood, then it became darker. His last bowel movement was 3:00 a.m. this morning, and it was dark red. He did feel some dizziness this morning and he broke out in cold sweat with his last trip to the toilet.    He denies any asthma, wheezing, or emphysema. No dysphagia. No constipation. No diarrhea. No fever. No rash. No dysuria. No kidney stones. He does have chronic renal insufficiency. No  known prostate trouble. He gets headaches when he gets upset. He had a chemical stress test done a couple of years ago before his knee surgery that was negative.  He thinks he had an old heart attack years ago. No history of a stroke.   SOCIAL HISTORY:  He denies any alcohol in the last two years. No cigarettes since the year 2000.   PHYSICAL EXAMINATION:  VITAL SIGNS: Temperature 98, pulse 82, respirations 18, blood pressure 137/89, pulse oximetry 97% on room air.  GENERAL:  Black male in no acute distress.  He weighs 234 pounds.  HEENT: Sclera anicteric, conjunctiva slightly pale. Tongue slightly pale.  HEAD: Atraumatic.  NECK: Negative.  CHEST: Clear. No rales or rhonchi.  HEART: Shows no murmurs or gallops I can hear. Regular rate and rhythm.  ABDOMEN: There is some tenderness in the right upper quadrant. No palpable tenderness elsewhere.  RECTAL: Exam done in the ER showed some blood. This was not repeated at this time.  EXTREMITIES: No edema.  SKIN: Warm and dry.  PSYCHIATRIC: Mood and affect are appropriate.   LABORATORY, DIAGNOSTIC, AND RADIOLOGICAL DATA: Glucose 143, BUN 22, creatinine 2.39, sodium 141, potassium 3.5, chloride 106, CO2 24, calcium 7.6, lipase 106, total protein 5.5, albumin 2.7, total bilirubin 0.6, alkaline phosphatase 58, SGOT 22, SGPT 18, white count  9.1, hemoglobin 10.4, hematocrit 31.4, platelet count 197, O+ blood  with negative antibody screen. Pro time 14.8.   URINALYSIS: 3+ leukocyte esterases,  greater than 30 white cells per high-powered field.   ASSESSMENT:  1.  Rectal bleeding.  2.  Abdominal pain, right upper quadrant. 3.  Evidence of a urinary tract infection.  4.  Anemia.  5. Congestive heart failure, ejection reported previously at 25%.   PLAN:  1.  I talked to Dr. Benjie Karvonen to get a cardiology consultation and cardiology clearance for colonoscopy.  2.  Start clear liquid diet. 3.  Schedule a colonoscopy during this hospitalization. He does have  renal insufficiency, so we will give him GoLYTELY. I will follow with you. The bleeding could be diverticular, could have neoplasm. I doubt AVMs. Possibly could have large internal hemorrhoids.    ____________________________ Manya Silvas, MD rte:ts D: 08/25/2013 14:37:49 ET T: 08/25/2013 14:50:35 ET JOB#: [id number]  cc: Manya Silvas, MD, <Dictator> Sheikh A. Elijio Miles, MD Allyne Gee, MD  Manya Silvas MD ELECTRONICALLY SIGNED 09/05/2013 17:45

## 2014-06-16 NOTE — Discharge Summary (Signed)
PATIENT NAME:  Hector [name], AUNG MR#:  [id number] DATE OF BIRTH:  11-13-1965  DATE OF ADMISSION:  11/23/2013 DATE OF DISCHARGE:  11/26/2013  ADMITTING DIAGNOSIS: Hematemesis.   DISCHARGE DIAGNOSES:  1.  Hematemesis, suspected due to gastritis.  2.  Acute posthemorrhagic anemia, not requiring transfusion.  3.  Progressive abdominal pain, suspected musculoskeletal due to retching.  4.  Pyuria with no obvious urinary tract infection.  5.  Elevated troponin, no acute coronary syndrome, likely demand ischemia.  6.  Upper respiratory infection with virus/common cold.  7.  Mild ascites on CT of abdomen, likely due to underlying liver cirrhosis.  8.  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   DISCHARGE CONDITION: Stable.   DISCHARGE MEDICATIONS: The patient is to continue atorvastatin 80 mg p.o. at bedtime, lisinopril 40 mg p.o. daily, Coreg 25 mg p.o. twice daily, isosorbide mononitrate 60 mg p.o. daily, tramadol 50 mg p.o. twice daily as needed, Lasix 40 mg p.o. daily, hydralazine 25 mg 3 times daily, FiberCon 240 mg p.o. twice daily, hydrocortisone rectal suppository 25 mg twice daily as needed, sucralfate 1 gram 4 times daily, sodium chloride nasal spray 1 spray every 2 hours as needed, pantoprazole 40 mg p.o. twice daily. The patient was advised not to take aspirin or potassium at this point.   HOME OXYGEN: None.   DIET: A 2-gram sodium, low-fat, low-cholesterol, mechanical soft consistency.   ACTIVITY LIMITATIONS: As tolerated.    FOLLOWUP APPOINTMENT: With [facility] in 2 days after discharge; Dr. Allen Norris, gastroenterologist in 2 days after discharge.  The patient was advised also to have his potassium checked in approximately 2-3 days after discharge at the primary care  physician's office and then resume potassium if potassium level is low.   CONSULTANTS: Care management; social work; Dr. Allen Norris, Ms. Tamsen Snider nurse practitioner for Dr. Allen Norris.   RADIOLOGIC STUDIES: Chest x-ray, portable, single view, 11/23/2013, revealed stable cardiomegaly with vascular congestion. No significant interval change was noted. Ultrasound of abdomen, limited survey, 11/23/2013, was negative for gallstones, negative examination otherwise. CT scan of abdomen and pelvis with p.o. contrast only, 11/26/2013, revealing a small amount of ascites surrounding the liver and within the pelvis, which of uncertain etiology. No acute hepatobiliary abnormality was demonstrated, although could not exclude portal hypertension as well as early varices secondary to cirrhosis. No acute bowel or urinary tract abnormality was noted. Tiny left pleural effusion was also noted.   HOSPITAL COURSE: The patient is a 49 year old African American male with history of alcoholic cardiomyopathy, alcohol abuse, who presented to the hospital with vomiting of blood. Please refer to Dr. Nichola Sizer admission note done on 11/23/2013. On arrival to the hospital, the patient's vital signs were temperature of 97.8, pulse was 80, respiration was 23, blood pressure was 201/170, O2 saturation was 99% on room air. Physical examination was unremarkable. The patient's lab data done on arrival to the hospital showed elevated creatinine to 2.42, BUN of 31, otherwise BMP was unremarkable. The patient's estimated GFR for African American would be 30. The patient's lipase level of 92. Liver enzymes revealed a total bilirubin of 1.6, otherwise liver enzymes were normal. Troponin, first set, revealed troponin elevation of 0.16, second 0.12, and third 0.10. The patient's white blood cell count was normal at 9.3, hemoglobin was 9.2, platelet count was 331,000. Absolute neutrophil count  was not checked. Blood cultures taken on 11/23/2013 showed no  growth. Urine cultures showed mixed bacterial organisms, results suggestive of contamination. Urinalysis revealed 1+ leukocyte esterase, 2 red blood cells, 17 white blood cells, no bacteria. EKG showed sinus rhythm at 85 beats per minute, occasional premature ventricular complexes, minimal voltage criteria for LVH, may be normal variant,  nonspecific T wave abnormality, and prolonged QTc to 516 msec.   The patient was admitted to the hospital for further evaluation. He was evaluated by gastroenterologist. Dr. Tamsen Snider saw the patient in consultation on 11/23/2013. According to Dr. Tamsen Snider, only with the third time after retching, the patient experienced a small amount of bright red blood mixed with emesis and she felt that there was hematemesis secondary to Mallory-Weiss tear and less likely peptic ulcer disease or erosive gastritis, esophagitis. Recommended to hold aspirin and apparently the patient was using Alka-Seltzer as well as Aleve in the past, which according to gastroenterologist placed him at high risk for GI bleed. Upon further investigation, it appeared that the patient did have an EGD as well as colonoscopy in 08/2013 by Dr. Vira Agar, which revealed a hiatal hernia, gastritis, as well as erythematous duodenopathy, as well as diverticulosis, and internal and external hemorrhoids This was discussed with Dr. Lucilla Lame and recommended to continue PPI twice a day as well as monitor, hemoglobin, and get EGD done if needed, avoid nonsteroidal anti-inflammatory medications, Alka-Seltzer , or aspirin therapy, and Anusol for hemorrhoidal bleed. Since the patient did not have any recurrent bleeding or any drop of hemoglobin level it was felt he was stable and not to undergo repeat EGD testing. The patient's diet advanced and he tolerated this diet well. Since he was still having some discomfort in his epigastrium area, a CT scan of abdomen was performed which showed no significant abnormalities  apart from discussed. It was felt that the patient is stable to be discharged home with above-mentioned medications and follow up. On the day of discharge, the patient's temperature was 98.1, pulse was 65, respiration was 20, blood pressure 117/73, saturation was 97% on room air at rest.   Of note, in regards to abdominal discomfort, which the patient experienced, Dr. Allen Norris felt that was it was abdominal wall discomfort/pain due to musculoskeletal stretching during retching episodes.   In regards to hypertension, hyperlipidemia, CKD, the patient was advised to continue his outpatient management. No significant changes were made. The patient was complaining of some upper respiratory symptoms, cold and nasal congestion. He was recommended sodium chloride nasal spray as needed.   Because of his mild anemia, he was also recommended iron supplementation, which was given to him upon discharge.   In regards to his CKD, the patient's kidney function was followed along while he was in the hospital and creatinine remained stable at 2.21 on the day of discharge, 11/24/2013. The patient is to follow up with his primary care physician and make decisions about nephrology  follow up as outpatient if needed.   In regards to liver abnormalities, the patient's total bilirubin was 1.6 on the day of admission, 11/23/2013. It remained stable at 1.5 by the day of discharge. No other abnormalities were noted, although the patient would benefit from evaluation by a gastroenterologist and further management of his chronic liver disease, questionable liver cirrhosis.   In regards to posthemorrhagic anemia, the patient's hemoglobin level dropped down to 7.7 on 11/26/2013; however, the patient was not bleeding anymore.   TIME SPENT: 40 minutes.  ____________________________ Theodoro Grist, MD rv:ts D: 11/26/2013 15:01:10 ET T: 11/26/2013 22:09:07 ET JOB#: [id number]  cc: Theodoro Grist, MD, <Dictator> Lucilla Lame, MD [facility]   [place] MD ELECTRONICALLY SIGNED 11/30/2013 17:01

## 2014-06-16 NOTE — Consult Note (Signed)
Chief Complaint:  Subjective/Chief Complaint Patient with continued abd pain. Not affected by eating. Says it is worse when he walks around or moves to a sitting position.   VITAL SIGNS/ANCILLARY NOTES: **Vital Signs.:   03-Oct-15 08:46  Vital Signs Type Routine  Temperature Temperature (F) 98.2  Celsius 36.7  Pulse Pulse 57  Respirations Respirations 16  Systolic BP Systolic BP 407  Diastolic BP (mmHg) Diastolic BP (mmHg) 68  Mean BP 83  Pulse Ox % Pulse Ox % 95  Pulse Ox Activity Level  At rest  Oxygen Delivery Room Air/ 21 %   Brief Assessment:  GEN well developed, well nourished, no acute distress   Respiratory normal resp effort  no use of accessory muscles   Gastrointestinal details normal Soft   Additional Physical Exam Abd - pain increased with stright leg lift and flexion of the abd wall muscules.   Lab Results: Routine Chem:  03-Oct-15 05:58   Glucose, Serum  109  BUN  29  Creatinine (comp)  2.52  Sodium, Serum 139  Potassium, Serum 3.6  Chloride, Serum 105  CO2, Serum 28  Calcium (Total), Serum  7.6  Anion Gap  6  Osmolality (calc) 284  eGFR (African American)  35  eGFR (Non-African American)  29 (eGFR values <24m/min/1.73 m2 may be an indication of chronic kidney disease (CKD). Calculated eGFR, using the MRDR Study equation, is useful in  patients with stable renal function. The eGFR calculation will not be reliable in acutely ill patients when serum creatinine is changing rapidly. It is not useful in patients on dialysis. The eGFR calculation may not be applicable to patients at the low and high extremes of body sizes, pregnant women, and vetetarians.)  Routine Hem:  03-Oct-15 05:58   WBC (CBC) 9.1  RBC (CBC)  4.20  Hemoglobin (CBC)  8.0  Hematocrit (CBC)  27.3  Platelet Count (CBC) 292  MCV  65  MCH  19.2  MCHC  29.5  RDW  20.8  Neutrophil % 77.5  Lymphocyte % 11.0  Monocyte % 7.1  Eosinophil % 3.5  Basophil % 0.9  Neutrophil #  7.0   Lymphocyte # 1.0  Monocyte # 0.6  Eosinophil # 0.3  Basophil # 0.1 (Result(s) reported on 25 Nov 2013 at 06:43AM.)   Assessment/Plan:  Assessment/Plan:  Assessment Abd pain with vomiting blood.   Plan The patients pain on physical exam is worse with flexion of the muscular wall and may be related to his vomiting. His Hb is a bit lower today but no further signs of bleeding and patient reports that he has not had a bowel movement. If further bleeding then would expect to see melena.   Electronic Signatures: WLucilla Lame(MD)  (Signed 03-Oct-15 09:59)  Authored: Chief Complaint, VITAL SIGNS/ANCILLARY NOTES, Brief Assessment, Lab Results, Assessment/Plan   Last Updated: 03-Oct-15 09:59 by WLucilla Lame(MD)

## 2014-06-16 NOTE — Consult Note (Signed)
PATIENT NAME:  Hector [name], Hector [name] MR#:  [id number] DATE OF BIRTH:  Sep 11, 1965  DATE OF CONSULTATION:  11/23/2013  REFERRING PHYSICIAN:  Ceasar Lund. Anselm Jungling, MD CONSULTING PHYSICIAN:  Andria Meuse, NP  PRIMARY CARE PHYSICIAN: Venetia Maxon. Elijio Miles, MD   PRIMARY CARDIOLOGIST: Neoma Laming, MD  REASON FOR CONSULTATION: Abdominal pain and hematemesis.   HISTORY OF PRESENT ILLNESS: Hector [name] is a 49 year old black male who was in his usual state of health until 3 days ago where he began to have constant upper abdominal pain. He describes the pain as mostly epigastric. It feels like an ache. He has tried Tums and has been taking Alka-Seltzer Plus. He is on aspirin 81 mg daily. He also takes about 2 Aleve per week. This morning, he began to have vomiting and vomited twice after drinking some coffee. The third time he noticed streaks of bright red blood in small amounts mixed in with his vomit. He has not had any further vomiting since this morning. His hemoglobin is typically a 9 to 10 range; it was 9.2 upon admission, now 8.1. He has a chronic microcytic anemia and anemia of chronic disease given his chronic kidney disease. His creatinine was 2.42. His MCV was 65. He was discharged in July with a hemoglobin of 8.4. He denies any ill contacts. He has noticed some bright red blood with wiping on the toilet tissue with his stools as well. He has a daily bowel movement. Nothing seems to make the upper abdominal pain worse except eating and drinking. He does complain of early satiety. His abdominal pain does somewhat improve with bowel movements. He denies any back pain. He has had occasional dizzy spells. He had an abdominal ultrasound, which was negative for gallstones and showed a normal liver. He had a chest x-ray which showed stable cardiomegaly with vascular congestion. He had a normal lipase. He had a mildly elevated total bilirubin at 1.6 with otherwise normal LFTs.   PAST MEDICAL AND  SURGICAL HISTORY: Congestive heart failure, felt to be due to alcohol-induced cardiomyopathy with an ejection fraction of 25% to 35%. He has history of alcoholic hepatitis, right knee surgery, EGD 08/27/2013 by Dr. Vira Agar showed hiatal hernia, gastritis and erythematous duodenopathy. Colonoscopy 08/27/2013 by Dr. Vira Agar showed sigmoid, ascending colon diverticulosis and external and internal hemorrhoids. Malignant hypertension, chronic kidney disease, hyperlipidemia and chronically elevated troponin, followed by Dr. Neoma Laming.  MEDICATIONS PRIOR TO ADMISSION: Aleve p.r.n., aspirin 81 mg daily, Protonix 40 mg daily, Alka-Seltzer p.r.n., TUMS p.r.n., atorvastatin 80 mg at bedtime, carvedilol 25 mg b.i.d., hydralazine 25 mg t.i.d., isosorbide mononitrate 60 mg extended release in the morning, Lasix 40 mg daily, lisinopril 40 mg daily, potassium chloride 20 mEq extended-release daily, tramadol 50 mg b.i.d. p.r.n.   ALLERGIES: EGGS CAUSES A RASH.   FAMILY HISTORY: There is no known family history of colorectal carcinoma, liver or chronic GI problems.   SOCIAL HISTORY: The patient tells me he quit drinking 2 years ago. Previous history of heavy alcohol use. He is single. He does not have any children. He lives with his mother. He denies any illicit drug use and he quit smoking in 2000.   REVIEW OF SYSTEMS: See HPI. MUSCULOSKELETAL: He does have chronic low back pain.  NEUROLOGICAL: He had some dizziness and weakness. Otherwise, negative 12-point review of systems.  PHYSICAL EXAMINATION: VITAL SIGNS: Height 70.9 inches, weight 237.4 pounds, BMI 33.1. Temperature 97.6, pulse 64, respirations 20, blood pressure 136/89, oxygen saturation 95% on room air.  GENERAL: He is an alert, oriented, pleasant, cooperative black male in no acute distress.  HEENT: Sclerae clear, anicteric. Conjunctivae pink. Oropharynx pink and moist any lesions.  NECK: Supple without mass or thyromegaly.  CHEST: Heart regular  rhythm. Normal S1 and S2 without any murmurs, clicks, rubs or gallops.  LUNGS: Clear to auscultation bilaterally.  ABDOMEN: Protuberant with positive bowel sounds x 4. No bruits auscultated. Abdomen is soft, nontender, nondistended, without palpable mass or hepatosplenomegaly. No rebound tenderness or guarding.  EXTREMITIES: Without edema or clubbing or cyanosis.  MUSCULOSKELETAL: Good equal strength and movement bilaterally.  NEUROLOGIC: Grossly intact.  PSYCHIATRIC: Normal mood and affect.   LABORATORY STUDIES: See HPI. BUN 31, creatinine 2.42, otherwise normal basic metabolic panel. Lipase 92. Troponins elevated x 3. LFTs normal, except total bilirubin 1.6, platelets 331,000, white blood cell count 9.3. Urinalysis shows 100 mg of protein, 1+ LE, 17 white blood cells.   IMPRESSION: [name] is a 49 year old black male with 3 days of upper abdominal pain followed by vomiting 3 times this morning. With the third time there was a small amount of bright red blood mixed in emesis. I suspect hematemesis secondary to Mallory-wise tear, less likely peptic ulcer disease or erosive ulcerative esophagitis. Alka-Seltzer, Aleve and aspirin have placed him at high risk for gastrointestinal bleed. He has chronic microcytic anemia and anemia of chronic disease. Workup including EGD and colonoscopy in July 2015 by Dr. Vira Agar showed hiatal hernia, gastritis and erythematous duodenopathy as well as diverticulosis and internal and external hemorrhoids. He also has hyperbilirubinemia which could be secondary to dehydration or acute illness. No evidence of biliary dilation or liver disease. We will recheck LFTs in the morning. I have discussed his care with Dr. Lucilla Lame. There is no indication for EGD at this time as he has already been placed on b.i.d. PPI, which would be treatment for ulcer. There was no evidence of malignancy on EGD 3 months ago. EGD would be necessary if intervention is needed to control  bleeding.   PLAN:  1.  Agree with b.i.d. Protonix 40 mg IV.  2.  Monitor H and H; if continued drop in hemoglobin will need EGD tomorrow with Dr. Allen Norris.  3.  Avoid nonsteroidal anti-inflammatories and Alka-Seltzers.  4.  N.p.o. after midnight in the event that hemoglobin continues to drop.  5.  LFTs in the morning.  6.  Anusol-HC b.i.d. for hemorrhoidal bleeding.  Thank you for allowing [place] to participate in his care.   ____________________________ Andria Meuse, NP klj:TT D: 11/23/2013 20:16:48 ET T: 11/23/2013 20:31:42 ET JOB#: [id number]  cc: Andria Meuse, NP, <Dictator> Dionisio David, MD Venet[facility] Maxon. Elijio Miles, MD Andria Meuse FNP ELECTRONICALLY SIGNED 12/06/2013 11:26

## 2014-06-16 NOTE — Consult Note (Signed)
PATIENT NAME:  Hector [name], Hector [name] MR#:  [id number] DATE OF BIRTH:  [birth year]  DATE OF CONSULTATION:  08/26/2013  REFERRING PHYSICIAN:  Dr. Vianne Bulls. CONSULTING PHYSICIAN:  Corey Skains, MD  REASON FOR CONSULTATION: Known LV systolic dysfunction, heart failure, coronary artery disease, hyperlipidemia with GI bleed.   CHIEF COMPLAINT: "I've had some bleeding."   HISTORY OF PRESENT ILLNESS: This is a 49 year old male with known moderate dilated cardiomyopathy, with known coronary artery disease with ejection fraction of 35% on appropriate medication management for further risk reduction. The patient has done fairly well from the cardiac standpoint with no current evidence of new chest discomfort, anginal symptoms, and/or congestive heart failure. The patient has had significant appropriate medications and has been stable over the last many months with physical activity to a reasonable degree. Recently, he has had some GI bleed with bright red blood per rectum, which has had an increase in frequency and/or intensity over the last few days with no hemoglobin of 8.9. He had some chronic kidney disease with a creatinine in the low 1.8. He does have appropriate lipid management. Currently, he has an EKG showing normal sinus rhythm with diffuse anterior precordial T wave inversions for which appear to be not changed. The patient is stable at this time.   REVIEW OF SYSTEMS: The remainder review of systems negative for vision change, ringing in the ears, hearing loss, cough, congestion, heartburn, nausea, vomiting, diarrhea, bloody stools, stomach pain, extremity pain, leg weakness, cramping of the buttocks, known blood clots, headaches, blackouts, dizzy spells, nosebleeds, congestion, trouble swallowing, frequent urination, urination at night, muscle weakness, numbness, anxiety, depression, skin lesions or skin rashes.   PAST MEDICAL HISTORY:  1.  Heart failure.  2.  LV systolic dysfunction. 3.   Coronary artery disease.  4.  Hyperlipidemia.  5.  Chronic kidney disease.   SOCIAL HISTORY: The patient currently has no apparent significant issues with tobacco or alcohol use.  ALLERGIES: AS LISTED.   MEDICATIONS: As listed.   PHYSICAL EXAMINATION:  VITAL SIGNS: The patient is a well-appearing male in no acute distress.  HEENT: No icterus, thyromegaly, ulcers, hemorrhage, or xanthelasma.  CARDIOVASCULAR: Regular rate and rhythm with normal S1 and S2. No apparent murmur, gallop, or rub. PMI is diffuse. Carotid upstroke normal without bruit. Jugular venous pressure is normal.  LUNGS: Clear to auscultation with normal respirations.  ABDOMEN: Soft, nontender, without hepatosplenomegaly or masses. Abdominal aorta is normal size without bruit.  EXTREMITIES: Two plus radial, femoral, dorsal pedal pulses, with no lower extremity edema. No cyanosis, clubbing, or ulcers.  NEUROLOGIC: He is oriented to time, place, and person with normal mood and affect.   ASSESSMENT: A 49 year old male with history of dilated cardiomyopathy, coronary artery disease, hypertension, and hyperlipidemia with a no evidence of angina, congestive heart failure or other significant symptoms at this time.    RECOMMENDATIONS:  1.  Stable, at lowest risk possible for cardiovascular complication with colonoscopy or other further intervention to assess causes of gastrointestinal bleed. 2.  No restrictions to treatment of GI bleed. 3.  Continue current medical regimen for cardiomyopathy and congestive heart failure, stable at this time.  4.  Lipid management with a goal LDL below 70 mg/dL with current medical regimen.  5.  No further cardiac diagnostics necessary today.    ____________________________ Corey Skains, MD bjk:ts D: 08/26/2013 07:49:28 ET T: 08/26/2013 12:25:08 ET JOB#: [id number]  cc: Corey Skains, MD, <Dictator> Corey Skains MD ELECTRONICALLY SIGNED 08/28/2013 13:22

## 2014-06-16 NOTE — Consult Note (Signed)
Pt doing well. VSS afebrile.  hgb 8.9 today, plt ct 194.  no bleeding during nite.  Exam chest clear, heart RRR, abd neg.  Plan EGD and colonoscopy tomorrow.  Golytely prep this afternoon.  Electronic Signatures: Manya Silvas (MD)  (Signed on 04-Jul-15 09:57)  Authored  Last Updated: 04-Jul-15 09:57 by Manya Silvas (MD)

## 2014-06-16 NOTE — H&P (Signed)
PATIENT NAME:  Hector [name], Hector [name] MR#:  [id number] DATE OF BIRTH:  Jul 08, 1965  DATE OF ADMISSION:  11/23/2013  PRIMARY CARE PHYSICIAN: Pernell Dupre, MD  REFERRING EMERGENCY ROOM PHYSICIAN: Francene Castle, MD  PRIMARY CARDIOLOGIST: Dr. Neoma Laming  CHIEF COMPLAINT: Vomiting blood.   HISTORY OF PRESENT ILLNESS: A 49 year old male with past history of chronic alcoholism in the past and having alcoholic cardiomyopathy. For the last 3 days he started having epigastric pain and today morning was feeling nauseous and started vomiting. He vomited 2 to 3 times with the last one having some blood in there so came to Emergency Room. In the ER, a sonogram of the abdomen was done without any significant finding, but troponin was slightly elevated and with this complaint of bleeding he was given as admission to hospitalist team. On further questioning, he states that he had some blood in the stool last week, but it stopped on its own. He denies taking any pain medications at home, any over-the-counter pain killers, but he takes aspirin 81 mg every day. Denies drinking alcohol since last 2 years.   REVIEW OF SYSTEMS: CONSTITUTIONAL: Negative for fever, fatigue, weakness, pain or weight loss.  EYES: No blurring, double vision, discharge or redness.  EARS, NOSE, THROAT: No tinnitus, ear pain or hearing loss.  RESPIRATORY: No cough, wheezing, hemoptysis or shortness of breath.  CARDIOVASCULAR: No chest pain, orthopnea, edema, arrhythmia, palpitations.  GASTROINTESTINAL: The patient had vomiting, 1 time blood in there. No diarrhea. Abdominal epigastric pain is present.  GENITOURINARY: No dysuria, hematuria, or increased frequency.  ENDOCRINE: No heat or cold intolerance. No excessive sweating.  SKIN: No acne, rashes or lesions. MUSCULOSKELETAL: No pain or swelling in the joints.  NEUROLOGICAL: No numbness, weakness, tremor or vertigo.  PSYCHIATRIC: No anxiety, insomnia, bipolar disorder.   PAST MEDICAL  HISTORY:  1.  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  2.  Congestive heart failure.  3.  Alcohol abuse in the past, stopped since last 2 years.  4.  Malignant hypertension.  5.  Chronic kidney disease, baseline creatinine appears to be 1.8.  6.  Hyperlipidemia.  7.  Chronically elevated troponin.   PAST SURGICAL HISTORY: He had surgery on right knee.   PSYCHOSOCIAL HISTORY: Lives at home. Denies any smoking. He used to drink alcohol heavily, but stopped since 2 years. Denies any illicit drug use.   FAMILY HISTORY: Mother has history of diabetes, hypertension, and hyperlipidemia.   HOME MEDICATIONS:  1.  Tramadol 50 mg oral 2 times a day.  2.  Potassium chloride 20 mEq once a day.  3.  Lisinopril 40 mg once a day.  4.  Lasix 40 mg once a day.  5.  Isosorbide mononitrate 60 mg tablet once a day.  6.  Hydralazine 25 mg 3 times a day.  7.  Carvedilol 25 mg oral tablet 2 times a day.  8.  Atorvastatin 80 mg oral once a day.  9.  Aspirin 81 mg oral once a day.   PHYSICAL EXAMINATION: VITAL SIGNS: In the ER, temperature 97.8, pulse 80, respirations 23, blood pressure on presentation was 201/170 which came down to 136/97, oxygen saturation 99% on room air.  GENERAL: The patient is fully alert and oriented to time, place, and person. Does not appear in acute distress.  HEENT: Head and neck atraumatic. Conjunctivae pink. Oral mucosa moist.  NECK: Supple. No JVD.  RESPIRATORY: Bilateral equal and clear air entry.  CARDIOVASCULAR: S1 and S2 present, regular. No murmur.  ABDOMEN: Mild tenderness in epigastric area. No organomegaly felt. Bowel sounds normal.  SKIN: No acne, rashes, or lesions.  JOINTS: No swelling or tenderness.  LEGS: No edema.  NEUROLOGIC: Power 5/5. No tremor or rigidity. Follows commands.  PSYCH: Does not appear in any acute psychiatric illness at this time.   DIAGNOSTIC DATA: Important lab results: Glucose 99, BUN 31, creatinine 2.42, sodium 138,  potassium 3.9, chloride 103, CO2 26.   Total protein 6.7, albumin 3.5, bilirubin 1.6, alkaline phosphatase 61, SGOT 26 and SGPT 21.   Troponin 0.16.   WBC 9.3, hemoglobin 9.2, platelet count 331,000 and MCV 65.   Urinalysis is grossly positive with 17 WBC and 1+ leukocyte esterase.  Ultrasound of abdomen is done which is negative for gallstone or any other significant finding.   Chest x-ray, portable, single view is done and showed stable cardiomegaly with vascular congestion. No significant interval changes.   ASSESSMENT AND PLAN: A 49 year old male with severe alcoholic cardiomyopathy and hypertension who came to the Emergency Room having an episode of vomiting blood after having epigastric pain for the last 3 days.  1.  Gastrointestinal bleed. Most likely from the presentation it appears to be peptic ulcer disease, but because of chronic alcoholism history we would keep our differentials open. We will call gastroenterology consult for serial hemoglobin, keep on clear liquid diet now and give PPI b.i.d. Avoid anticoagulants.  2.  Severe cardiomyopathy with ejection fraction 20%. Currently, there are no signs of decompensation. Would like to avoid IV fluids because of this severe cardiomyopathy.  3.  Urinary tract infection. His urinalysis is positive so we will send for urine culture and start him on Rocephin IV.  4.  Elevated troponin. This might be partly due to his severe cardiomyopathy and his chronic renal failure so no further cardiac work-up unless it goes further higher on follow-up checks. We will follow serial troponins and monitor him on telemetry.  5.  Hypertension. He is on multiple antihypertensive medications at home, but as he presented with these gastrointestinal bleed symptoms would like to hold the medications at this time. Blood pressure is currently stable. Continue monitoring.  CODE STATUS: FULL code.   TOTAL TIME SPENT ON THIS ADMISSION: 50  minutes.  ____________________________ Ceasar Lund Anselm Jungling, MD vgv:sb D: 11/23/2013 10:35:29 ET T: 11/23/2013 11:15:36 ET JOB#: [id number]  cc: Ceasar Lund. Anselm Jungling, MD, <Dictator> Dionisio David, MD Venetia Maxon. Elijio Miles, MD  Vaughan Basta MD ELECTRONICALLY SIGNED 11/25/2013 12:49

## 2014-06-16 NOTE — Discharge Summary (Signed)
PATIENT NAME:  Hector [name], Hector [name] MR#:  [id number] DATE OF BIRTH:  1965-06-03  DATE OF ADMISSION:  08/25/2013 DATE OF DISCHARGE:  08/27/2013  ADMITTING PHYSICIAN:  Dr. Margaretmary Eddy.  DISCHARGING PHYSICIAN:  Dr. Pernell Dupre.  CONSULTATIONS:  Gastroenterology, Dr. Vira Agar.   PROCEDURES:   1.  Upper GI endoscopy.  2.  Colonoscopy.   IMAGING:  None.  HOSPITAL COURSE: This gentleman was admitted with right upper quadrant pain and bright red blood per rectum of 1-day duration with a past medical history of CHF, hypertension, chronic renal insufficiency, and coronary artery disease.  Please refer to his history and physical for details.  He was clinically assessed to have acute lower GI bleed and was admitted to a monitored bed. Gastroenterology was consulted. Dr. Vira Agar evaluated him and performed an upper GI endoscopy which was unremarkable. A colonoscopy revealed multiple diverticula without evidence of diverticulitis. Bleeding was attributed to possible diverticula and Dr. Percell Boston recommendation was that patient could be discharged to home. The patient'Hector [name] hemoglobin remained stable. He did not require any further blood transfusions. His blood pressure was high during this hospitalization and his home medications were resumed and p.r.n. hydralazine was added. The patient underwent cardiovascular screening prior to his colonoscopy by Dr. Nehemiah Massed and was cleared to proceed with that.   DISCHARGE MEDICATIONS: Please refer to medical reconciliation that was completed by me.   DISCHARGE INSTRUCTIONS:  1.  Diet: Low sodium, low fat, low cholesterol.  2.  Activity: As tolerated.  3.  Followup: With Dr. Elijio [name] in 1-2 weeks.   ________________________ Hector Maxon Elijio Miles, MD sat:lt D: 09/13/2013 13:43:31 ET T: 09/13/2013 15:05:03 ET JOB#: [id number]  cc: Hector A. Elijio Miles, MD, <Dictator> Hector Fells MD ELECTRONICALLY SIGNED 09/18/2013 13:32

## 2014-06-16 NOTE — Consult Note (Signed)
Brief Consult Note: Diagnosis: Hematemesis, Abdominal pain.   Patient was seen by consultant.   Consult note dictated.   Comments: Mr. Utrera is a 49 y/o black male with 3 days of upper abdominal pain followed by vomiting 3 times this AM.  With the 3rd time, there was small amount of bright red blood mixed in emesis.  I suspect hematemesis secondary to [employer] tear, less likely PUD or erosive esophagitis.  Alkaseltzer, Aleve & ASA have placed him at risk for GI bleed.  He has chronic microcytic anemia & anemia of chronic disease.  Workup including EGD & colonoscopy in July 2015 by Dr Vira Agar showed hiatal hernia, gastritis & erythematous duodenopathy, as well as diverticulosis & internal & external hemorrhoids.  He also has hyperbilirubinemia which could be secondary to dehydration or acute illness.  No evidence of biliary dilation or liver disease.  Recheck in AM.  I will discuss his care with Dr Lucilla Lame.  Plan: 1) Agree with BID protonix 40mg  IV 2) Monitor H/H, if continued drop in hgb will need EGD tomorrow with Dr Allen Norris 3) Avoid NSAIDS & alkaseltzers 4) NPO after MN 5) LFTS AM 6) Anusol HC BID for hemorrhoidal bleeding Thanks for allowing [place] to participate in his care.  Please see full dictated note. [id number].  Electronic Signatures: Andria Meuse (NP)  (Signed 01-Oct-15 20:17)  Authored: Brief Consult Note   Last Updated: 01-Oct-15 20:17 by Andria Meuse (NP)

## 2014-06-16 NOTE — H&P (Signed)
PATIENT NAME:  Hector [name], Hector [name] MR#:  [id number] DATE OF BIRTH:  1965-04-01  DATE OF ADMISSION:  08/25/2013  PRIMARY CARE PHYSICIAN: Nonlocal.   REFERRING PHYSICIAN: Briant Sites. Joni Fears, MD  CHIEF COMPLAINT: Right upper quadrant abdominal pain and bright red blood per rectum since yesterday.   HISTORY OF PRESENT ILLNESS: The patient is a 49 year old male with past medical history of congestive heart failure, hypertension, chronic renal insufficiency, coronary artery disease and hyperlipidemia, who is presenting to the ED with a chief complaint of bright red blood with bowel movements since yesterday. The patient is feeling dizzy and has been having right upper quadrant abdominal pain. He has reported that he had similar complaint in the past, approximately 1 year ago, but he did not seek any medical attention. He used to drink alcohol heavily, and he has history of alcohol-induced cardiomyopathy, but reporting that he stopped drinking. His recent ejection fraction was at 25% to 35%.   PAST SURGICAL HISTORY: Right knee surgery.  ALLERGIES: No known drug allergies.   PSYCHOSOCIAL HISTORY: Lives at home with mom. Denies any history of smoking. He used to drink alcohol heavily and has history of alcohol cardiomyopathy, recently quit drinking alcohol. Denies any illicit drug usage.   FAMILY HISTORY: Mom has history of diabetes mellitus, hypertension and hyperlipidemia.   HOME MEDICATIONS:  1. Tramadol 50 mg 1 tablet p.o. 2 times a day.  2. Potassium chloride 20 mEq 1 tablet p.o. once daily. 3. Lisinopril 40 mg p.o. once daily. 4. Lasix 40 mg p.o. once daily.  5. Isosorbide mononitrate 60 mg 1 tablet p.o. once daily.  6. Hydralazine 25 mg p.o. 3 times a day. 7. Coreg 25 mg p.o. b.i.d. 8. Atorvastatin 80 mg p.o. once daily. 9. Aspirin 81 mg p.o. once daily.  REVIEW OF SYSTEMS:  CONSTITUTIONAL: Denies any fever. Complaining of fatigue and weakness.  EYES: Denies blurry vision, double  vision.  ENT: Denies epistaxis, discharge. Denies tinnitus.  RESPIRATION: Denies cough, COPD.  CARDIOVASCULAR: Denies any chest pain, palpitations. Complaining of dizziness. No loss of consciousness.  GASTROINTESTINAL: Denies nausea, vomiting. Is having bright-red colored bowel movements since yesterday associated with right upper quadrant abdominal pain.  GENITOURINARY: No dysuria or hematuria.  ENDOCRINE: Denies polyuria, nocturia, heat or cold intolerance.  MUSCULOSKELETAL: Denies any joint pain. Denies any muscle pain, muscle aches. No shoulder pain or hip pain.  INTEGUMENTARY: No acne, rash, lesions.  PSYCHIATRIC: No ADD or OCD.   PHYSICAL EXAMINATION: VITAL SIGNS: Temperature 97.9, pulse 82, respirations 18, blood pressure 122/80, pulse oximetry 98%.  GENERAL APPEARANCE: Not under acute distress. Moderately built and nourished.  HEENT: Normocephalic, atraumatic. Pupils are equally reacting to light and accommodation. No scleral icterus. No conjunctival injection. No sinus tenderness. Moist mucous membranes. No postnasal drip.  NECK: Supple. No JVD. No thyromegaly. Range of motion is intact.  LUNGS: Clear to auscultation bilaterally. No accessory muscle usage. No anterior chest wall tenderness on palpation.  CARDIAC: S1 and S2 normal. Regular rate and rhythm. No murmurs.  GASTROINTESTINAL: Soft. Positive right upper quadrant tenderness. No diffuse tenderness. No rebound tenderness. Bowel sounds are positive in all 4 quadrants. No masses felt.  NEUROLOGIC: Awake, alert and oriented x3. Cranial nerves II through XII are grossly intact. Motor and sensory are intact. Reflexes are 2+.  EXTREMITIES: No edema. No cyanosis. No clubbing.  SKIN: Warm to touch. Normal turgor. No rashes. No lesions.  MUSCULOSKELETAL: No joint effusion, tenderness, erythema.  PSYCHIATRIC: Normal mood and affect.   LABORATORY AND  IMAGING STUDIES: A 12-lead EKG with normal sinus rhythm at 83 beats per minute, left  ventricular hypertrophy, nonspecific T wave abnormality. Glucose is 143, BUN 22, creatinine 2.39, sodium, potassium and chloride are normal, CO2 is normal, anion gap is 11, serum osmolality is normal, calcium 7.6, lipase is 106. LFTs: Total protein 5.5, albumin is 2.7. The rest of the LFTs are normal. WBC 9.1, hemoglobin is 10.1, hematocrit is 30.7, platelet count 197. He is O positive, antibody negative. PT 14.8, INR is 1.2.   ASSESSMENT AND PLAN: A 49 year old male presenting to the ED with a chief complaint of right upper quadrant abdominal pain and bright red blood per rectum for the past 2 days. Will be admitted with the following assessment and plan.   1. Acute lower gastrointestinal bleed associated with acute right upper quadrant abdominal pain. Will admit him to telemetry. Keep him n.p.o. Provide IV fluids. GI consult is placed to Dr. Vira Agar. Will provide him morphine as needed for pain. GI prophylaxis with Protonix. Type and screening is done. If necessary, will provide blood transfusion. Will check hemoglobin and hematocrit every 6 to 8 hours.  2. Chronic history of congestive heart failure, currently not fluid overloaded. Will provide him gentle hydration with IV fluids while the patient is n.p.o. while closely monitoring for symptoms and signs of fluid overload.  3. Hypertension. Will hold off on his home medications, and if necessary, will provide IV antihypertensives.  4. Chronic kidney disease. Monitor renal function closely, but currently, the patient is on IV fluids as he is n.p.o.  5. Coronary artery disease. Denies any chest pain or shortness of breath.  6. Hyperlipidemia. Hold off on statin.  7. Will provide gastrointestinal prophylaxis with Protonix and deep vein thrombosis prophylaxis with SCDs.   CODE STATUS: He is full code. Mom is the medical power of attorney.   Plan of care discussed with the patient and mom at bedside. They both verbalized understanding of the plan.    TOTAL TIME SPENT ON ADMISSION: 50 minutes.   ____________________________ Nicholes Mango, MD ag:lb D: 08/25/2013 07:51:42 ET T: 08/25/2013 08:37:49 ET JOB#: [id number]  cc: Nicholes Mango, MD, <Dictator> Nicholes Mango MD ELECTRONICALLY SIGNED 08/29/2013 0:36

## 2014-06-17 NOTE — Consult Note (Signed)
PATIENT NAME:  Hector [name], Hector [name] MR#:  [id number] DATE OF BIRTH:  [birth year]  DATE OF CONSULTATION:  03/09/2011  REFERRING PHYSICIAN:  [facility]/patient also seen by [facility] CONSULTING PHYSICIAN:  Kristapher [place] D. Teasia Zapf, MD  INDICATION: Elevated troponins, possible non-Q-wave myocardial infarction.   HISTORY OF PRESENT ILLNESS: [name] is a 49 year old African American male well known to me with a history of chest pain, hypertension, alcohol abuse, chronic renal insufficiency, hyperlipidemia, alcoholic cardiomyopathy who presented with recurrent symptoms of hypertension, chest pain, shortness of breath. He had been drinking again and had recurrent symptoms of recurrent alcohol abuse with cardiomyopathy. He was brought in for further evaluation and management.   REVIEW OF SYSTEMS: No blackout spells, syncope. No nausea, vomiting. No fever. No chills. No sweats. No weight loss. No weight gain. No hemoptysis, hematemesis. No bright red blood per rectum.   PAST MEDICAL HISTORY:  1. Alcoholic cardiomyopathy. 2. Congestive heart failure. 3. Alcohol abuse.  4. Malignant hypertension.  5. Medical noncompliance. 6. Chronic renal failure.  7. Hyperlipidemia.  8. Elevated troponin.   MEDICATIONS:  1. Hydralazine 25 mg 3 times daily.  2. Lasix 20 a day.  3. Atorvastatin 80 a day.  4. Lisinopril 20 a day.  5. Tramadol 50 q.6 p.r.n.  6. Vitamin D as necessary once a week.   ALLERGIES: Eggs.   FAMILY HISTORY: Diabetes.   SOCIAL HISTORY: No smoking. He still drinks. Unemployed. Lives with his mother.   PHYSICAL EXAMINATION:  VITAL SIGNS: Blood pressure 200/120, pulse 85, respiratory rate 16, afebrile.   HEENT: Normocephalic, atraumatic. Pupils equal, reactive to light.   NECK: Supple. No significant jugular venous distention, bruits, adenopathy.   LUNGS: Clear to auscultation and percussion. No significant wheezing, rhonchi, or rales.   HEART: Regular rate and rhythm.  Positive S3. Soft S4. PMI nondisplaced.   EXTREMITIES: Within normal limits.   NEUROLOGIC: Examination is intact.   SKIN: Normal.   LABORATORY, DIAGNOSTIC AND RADIOLOGICAL DATA: EKG: Normal sinus rhythm, left ventricular hypertrophy, nonspecific ST-T changes. Chest x-ray: Cardiomegaly. CK was elevated at 600, MB 11, troponin elevated at 0.2. CBC within normal limits. Platelet count 148, sodium 144, potassium 3.9, white count 53, hemoglobin and hematocrit 14 and 43. BNP 1700.    ASSESSMENT:  1. Positive troponin, elevated.  2. Malignant hypertension,  3. Congestive heart failure.  4. Cardiomyopathy.  5. Chest pain. 6. Renal insufficiency.  7. Alcohol abuse.  8. Hyperlipidemia.   PLAN: Agree with admit. Follow troponins. Place on telemetry. Follow up EKGs. Advised the patient to quit drinking. Watch for DTs. Do not recommend cardiac catheterization or echocardiogram at this point. Anticoagulation for 24 hours and hopefully his troponin will trend back down. His elevated troponin is probably related to his renal insufficiency. Will treat the patient medically for now unless symptoms persist, worsen, or recur.  ____________________________ Loran Senters. Clayborn Bigness, MD ddc:cms D: 03/09/2011 16:59:49 ET T: 03/10/2011 10:26:20 ET JOB#: [id number]  cc: Marnee Sherrard D. Clayborn Bigness, MD, <Dictator>  Yolonda Kida MD ELECTRONICALLY SIGNED 03/12/2011 14:18

## 2014-06-17 NOTE — Discharge Summary (Signed)
PATIENT NAME:  Hector [name], Hector [name] MR#:  [id number] DATE OF BIRTH:  04/11/1965  DATE OF ADMISSION:  03/07/2011 DATE OF DISCHARGE:  03/10/2011  DISCHARGE DIAGNOSES:  1. Accelerated hypertension.  2. Alcohol abuse.  3. Chronic kidney disease.  4. Chronic elevated troponin.  5. Alcoholic cardiomyopathy.  6. Hyperlipidemia.   DISCHARGE MEDICATIONS: The patient's medication regimen was adjusted while he was in the hospital. The patient is to begin taking the following medications upon discharge. 1. Hydralazine 25 mg 1 tab orally three times daily. 2. Hydralazine 50 mg 1 tab orally three times daily in addition to hydralazine 25 mg for a total daily dose of 75 mg three times daily.  3. Ultram 50 mg oral p.r.n. up to every 6 hours for pain.  4. Atorvastatin 80 mg 1 tab orally once a day at bedtime. 5. Furosemide 40 mg 1 tab by mouth once a day.  6. Imdur 60 mg take one tablet by mouth once a day.  7. Lisinopril 30 mg take one pill by mouth once a day.  8. Carvedilol 6.25 mg take 1 pill by mouth twice a day.  9. Klor-Con 20 mEq take one by mouth once a day.  NOTE: The patient is to stop taking metoprolol tartrate (he has been switched to carvedilol instead).   HOSPITAL COURSE: This patient is a 49 year old African American gentleman with a history of malignant hypertension, chronic kidney disease stage III, and alcoholic cardiomyopathy with systolic heart failure with the last ejection fraction in August of 2012 noted to be 20 to 25%. The patient also suffers from a history of alcohol abuse. Please see history and physical exam from date of admission for full details surrounding his initial presentation, triage, and evaluation. The patient was admitted to a standard medical bed, and his blood pressure was quickly but judiciously and appropriately brought under control. Laboratory analysis revealed the patient had elevated troponin; however, the patient is known to have chronically-elevated  troponins. Nevertheless, cardiology with Dr. Clayborn Bigness was consulted. Dr. Clayborn Bigness felt the patient's troponins were consistent with his already-known state of chronically elevated troponins. Thus, further cardiac intervention was deferred while the patient was hospitalized.   During the patient's hospitalization, the importance of remaining abstinent from alcohol was emphasized by all his care providers. The patient did say he was going to go back to being abstinent from alcohol upon discharge.   The patient's blood pressure improved with medication adjustment during his hospitalization. He was informed of his new medication regimen prior to discharge.   His chronic kidney disease remained stable throughout his hospital stay.  On the date of discharge, the patient was cleared for discharge by both cardiology and internal medicine. He will have close follow-up with cardiology, internal medicine, and nephrology all within seven days of his discharge. The importance of medication compliance and alcohol abstinence was again emphasized on day of discharge with the patient.   Overall, the patient's hospital course was uncomplicated.   DIET: Low salt, low cholesterol diet.   ACTIVITY: As tolerated.   FOLLOWUP: He will have followup visits with internal medicine Wilnette Kales, PA-C, MS PAS), cardiology (Dr. Clayborn Bigness), and nephrology [facility]) all within seven days of discharge.   TIME SPENT: Discharge time spent including coordination of care, patient education and the like was greater than 30 minutes.  ____________________________ Kizzie Furnish Audie Clear, dictatin on behalf of Dr. Elijio Miles mgm:slb D: 03/10/2011 14:03:58 ET     T: 03/11/2011 09:25:27 ET  JOB#: [id number] cc: Kizzie Furnish. Prudence Davidson, PA, <Dictator> Elwyn Reach PA ELECTRONICALLY SIGNED 03/11/2011 16:26 Veverly Fells MD ELECTRONICALLY SIGNED 03/27/2011 12:15

## 2014-06-17 NOTE — H&P (Signed)
PATIENT NAME:  Hector [name], Hector [name] MR#:  [id number] DATE OF BIRTH:  Apr 15, 1965  DATE OF ADMISSION:  03/07/2011  PRESENTING COMPLAINT: Palpitations and chest pain.  PRIMARY CARE PHYSICIAN: Wilnette Kales, PA with [facility]   PRIMARY NEPHROLOGIST: Dr. Holley Raring   PRIMARY CARDIOLOGIST: Dr. Clayborn Bigness    HISTORY OF PRESENT ILLNESS: Hector [name] is a 49 year old African American gentleman well known to our service from previous admissions. 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 The patient was seen by the EMS, was tachycardic, received a dose of IV Cardizem, along with malignant hypertension at home. He was brought to the Emergency Room. His blood pressure was 190/130. He is about to get some Nitro-Dur patch and IV labetalol. The patient is chest pain free currently. He reports drinking alcohol about two to three cans of beer yesterday. He denies any DT's in the past. He is being admitted for further evaluation and management.   PAST MEDICAL HISTORY:  1. 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 Last echo was done in August of 2012.  2. Ongoing alcohol abuse.  3. Malignant hypertension.  4. History of medical noncompliance in the past.  5. Chronic renal failure, stage III.  6. Hyperlipidemia.  7. Chronically elevated troponin. The patient had normal Myoview stress test in November of 2011.   MEDICATIONS:  1. Hydralazine 75 mg t.i.d.  2. Lasix 20 mg daily.  3. Atorvastatin 80 mg daily.  4. Lisinopril 20 mg daily.  5. Tramadol 50 mg q.6 p.r.n.  6. Vitamin D 50,000 units once a week. The patient usually takes after the second week of  the month.   ALLERGIES: Eggs.   SOCIAL HISTORY: Does not smoke. He continues to drink about 2 or 3 beers a day. He is unemployed. He is on disability. He is not married. Lives with his mother.   FAMILY HISTORY: Mother has diabetes. Father passed away.   REVIEW OF SYSTEMS: CONSTITUTIONAL: No fever, fatigue, weakness. EYES: No blurred or double vision. ENT: No tinnitus, ear pain, hearing loss. RESPIRATORY: No cough, wheeze, hemoptysis. CARDIOVASCULAR: No chest pain, orthopnea, edema. GI: No nausea, vomiting, diarrhea, or abdominal pain. CARDIOVASCULAR: Positive for chest pain earlier, resolved. Positive for hypertension. GI: No nausea, vomiting, diarrhea, or abdominal pain. GU: No dysuria or hematuria. ENDOCRINE: No polyuria or nocturia. HEMATOLOGY: No anemia or easy bruising. SKIN: No acne or rash. MUSCULOSKELETAL: Positive for arthritis. NEUROLOGIC: No CVA or TIA. PSYCH: No anxiety or depression. All other systems reviewed and negative.   PHYSICAL EXAMINATION:   GENERAL: The patient is awake, alert, and oriented x3 not in acute distress.   VITAL SIGNS: Afebrile, pulse 86, blood pressure 191/130, sats 99% on 2 liters.   HEENT: Atraumatic, normocephalic. Pupils equal, round, and reactive to light and accommodation. Extraocular movements intact. Oral mucosa is moist.   NECK: Supple. No JVD. No carotid bruit.   RESPIRATORY: Clear to auscultation bilaterally. No rales, rhonchi, respiratory distress, or labored breathing.   CARDIOVASCULAR: Both the heart sounds are normal. Rate, rhythm is regular. PMI not lateralized. Chest nontender. Good pedal pulses. Good femoral pulses. No lower extremity edema.   ABDOMEN: Soft, benign, nontender. No organomegaly. Positive bowel  sounds.   NEUROLOGIC: Grossly intact cranial nerves II through XII. No motor or sensory deficit.   PSYCHIATRIC: The patient is awake, alert, and oriented x3.   SKIN: Warm and dry.   LABORATORY, DIAGNOSTIC, AND RADIOLOGICAL DATA:  EKG shows sinus rhythm with PACs, LVH changes, nonspecific T wave abnormality. Chest x-ray no acute abnormality. CK total 593. MB 11.5. Troponin 0.16. CBC within normal limits with platelet count 148, sodium 144, potassium 3.9, chloride 105, bicarb 25, SGOT 94, white count 5.3, hemoglobin and hematocrit 14.3 and 43.2. Troponin 0.16. B-type Natriuretic Peptide 1683.   ASSESSMENT: 49 year old Hector [name] with:  1. Accelerated/malignant hypertension.  2. Chest pain, now resolved with chronically elevated troponin suspected from malignant hypertension. The patient had a Myoview stress test in November 2011 that was negative.  3. CKD, stage III.  4. 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 Last echo done in August of 2012. Mildly elevated BNP, likely has acute on chronic systolic congestive heart failure.  5. Ongoing alcohol abuse.  6. Hyperlipidemia.   PLAN:  1. Admit patient to telemetry floor.  2. Two gram sodium diet.  3. Will start patient on nitroglycerin patch along with IV labetalol.  4. Will give all of his home medications.  5. Will start patient on Coreg 6.25 b.i.d. given cardiomyopathy, chronic CHF.  6. Adjust blood pressure medicines according to the patient's blood pressure monitoring.  7. Cycle cardiac enzymes x3. If troponin keeps trending up, consider Cardiology consultation with Dr. Clayborn Bigness who is the patient's primary cardiologist. 8. Monitor creatinine. It is stable, better than before, hence, will hold off on Nephrology consultation. The patient has had ultrasound of kidneys and ultrasound of the abdomen in the past.   9. Monitor for alcohol withdrawal and put patient on IV Ativan withdrawal protocol.  10. Further work-up according to the patient's clinical course.   Hospital admission plan was discussed with the patient. No family members were present. The patient was also advised on alcohol abstinence. I do not think the patient is motivated to do so.    CRITICAL TIME SPENT: 60 minutes.   ____________________________ Hart Rochester Posey Pronto, MD sap:drc D: 03/07/2011 08:04:44 ET T: 03/07/2011 08:42:20 ET JOB#: [id number]  cc: Kaydynce Pat A. Posey Pronto, MD, <Dictator> Kizzie Furnish. Prudence Davidson, PA Munsoor Lilian Kapur, MD Dwayne D. Clayborn Bigness, MD Ilda Basset MD ELECTRONICALLY SIGNED 03/10/2011 15:24

## 2014-06-17 NOTE — Consult Note (Signed)
Chief Complaint:   Subjective/Chief Complaint Pt states to be feeling much better today. No further pain.   VITAL SIGNS/ANCILLARY NOTES: **Vital Signs.:   15-Jan-13 10:17   Vital Signs Type Routine   Temperature Temperature (F) 98.4   Celsius 36.8   Temperature Source oral   Pulse Pulse 60   Pulse source per Dinamap   Respirations Respirations 18   Systolic BP Systolic BP 326   Diastolic BP (mmHg) Diastolic BP (mmHg) 96   Mean BP 110   BP Source Dinamap   Pulse Ox % Pulse Ox % 95   Pulse Ox Activity Level  At rest   Oxygen Delivery Room Air/ 21 %  *Intake and Output.:   Shift 15-Jan-13 07:00   Grand Totals Intake:   Output:  500    Net:  -500 24 Hr.:  -450   Urine ml     Out:  500   Length of Stay Totals Intake:  2400 Output:  3050    Net:  -650   Brief Assessment:   Cardiac Regular  -- carotid bruits  -- LE edema  -- JVD  --Gallop    Respiratory normal resp effort  clear BS    Gastrointestinal Normal    Gastrointestinal details normal Soft  Nontender  Nondistended  No masses palpable  Bowel sounds normal  No rebound tenderness   Routine Chem:  14-Jan-13 03:12    Glucose, Serum 113   BUN 23   Creatinine (comp) 1.70   Sodium, Serum 141   Potassium, Serum 3.9   Chloride, Serum 102   CO2, Serum 28   Calcium (Total), Serum 8.3   Osmolality (calc) 286   eGFR (African American) 56   eGFR (Non-African American) 46   Anion Gap 11  Cardiac:  14-Jan-13 03:12    Troponin I 0.11   Radiology Results: XRay:    12-Jan-13 06:25, Chest PA and Lateral   Chest PA and Lateral    REASON FOR EXAM:    pain  COMMENTS:   May transport without cardiac monitor    PROCEDURE: DXR - DXR CHEST PA (OR AP) AND LATERAL  - Mar 07 2011  6:25AM     RESULT: Comparison: 12/03/2010 and 01/28/2010    Findings:  Cardiomegaly and the mediastinum are similar to prior. Increased density   in the medial upper lobes is similar to prior studies and likely   secondary to overlying  costochondral junctions. No focal pulmonary   opacities.    IMPRESSION:   No acute cardiopulmonary disease.    Verified By: Gregor Hams, M.D., MD  Cardiology:    12-Jan-13 06:02, ED ECG   Ventricular Rate 93   Atrial Rate 93   P-R Interval 156   QRS Duration 90   QT 398   QTc 494   P Axis 18   R Axis -14   T Axis 89   ECG interpretation    Sinus rhythm with Premature atrial complexes  Moderate voltage criteria for LVH, may be normal variant  Nonspecific T wave abnormality  Prolonged QT  Abnormal ECG  When compared with ECG of 30-Nov-2010 15:16,  Premature atrial complexes are now Present  ----------unconfirmed----------  Confirmed by OVERREAD, NOT ([id number]), editor PEARSON, BARBARA ([id number]) on 03/09/2011 12:22:27 PM   ED ECG    Assessment/Plan:  Invasive Device Daily Assessment of Necessity:   Does the patient currently have any of the following indwelling devices? none   Assessment/Plan:   Assessment IMP Elevated  troponin CM CHF HTN ETOH CRI Hyperlipidemia .    Plan PLAN R/O MI Increase activity Advise to quit ETOH F/U nephology for CRI Continue Bp control ACE/ARB for CHF Lasix for CHF OK to d/c home with f/u as outpt   Electronic Signatures: Yolonda Kida (MD)  (Signed 15-Jan-13 13:27)  Authored: Chief Complaint, VITAL SIGNS/ANCILLARY NOTES, Brief Assessment, Lab Results, Radiology Results, Assessment/Plan   Last Updated: 15-Jan-13 13:27 by Lujean Amel D (MD)

## 2014-06-24 NOTE — Consult Note (Signed)
PATIENT NAME:  Hector [name], Hector [name] MR#:  [id number] DATE OF BIRTH:  26-Oct-1965  DATE OF CONSULTATION:  03/07/2014  REFERRING PHYSICIAN:   CONSULTING PHYSICIAN:  Kelby Fam. Zandyr Barnhill, PA-C  INDICATION FOR CONSULT: Elevated troponin and CHF.   HISTORY OF PRESENT ILLNESS: This is a 49 year old, African American male with past medical history of alcoholic cardiomyopathy, hypertension, chronic kidney disease, hyperlipidemia, who presented to the ER this morning complaining of chest pain. He states chest pain began upon awakening, is located beneath left breast, is sharp in nature, constant in duration. He admits to concurrent shortness of breath, nausea and palpitations. He states pain was improved with nitroglycerin given in the ER. Initial troponin was 0.17, thus patient was admitted and cardiology was consulted.   PAST MEDICAL HISTORY: Alcoholic cardiomyopathy. Patient no longer drinks alcohol, hypertension, chronic kidney disease, hyperlipidemia.   REVIEW OF SYSTEMS GENERAL: Positive for fatigue and malaise.  RESPIRATORY: Positive for shortness of breath. No orthopnea or PND.  CARDIOVASCULAR: Chest pain is improved, but still present.  GASTROINTESTINAL: No further episodes of nausea or vomiting.   FAMILY HISTORY: Negative for coronary artery disease or cardiomyopathy.   HOME MEDICATIONS:  Ranitidine 150 mg b.i.d., Zofran 4 mg t.i.d. p.r.n. nausea, lisinopril 40 mg daily, Imdur 60 mg 1 tablet daily, carvedilol 25 mg b.i.d., Lipitor 80 mg, at bedtime. The patient states he had been compliant with medications.   PHYSICAL EXAMINATION:  VITAL SIGNS:  Temperature 97.4, pulse 79 respirations 18, blood pressure 137/107, pulse oximetry 98% saturation on room air.   LABORATORY DATA: Initial troponin is 0.17. BNP 4299. Creatinine 2.47. EKG shows normal sinus rhythm, 88 beats per minute, left atrial enlargement, nonspecific ST and T changes, QTc of 503.   ASSESSMENT AND PLAN:  1. Elevated troponin: Per  patient's chart, he has a history of chronically elevated troponin. Advise continuation of aspirin, statin, beta blocker, ACE inhibitor and morphine. We will trend troponin results.  2. Congestive heart failure: Await echocardiogram results. Continue carvedilol and lisinopril.   We will continue to follow. Thank you very much for this consult    ____________________________ Kelby Fam. Dorena Cookey, PA-C ear:kl D: 03/07/2014 13:03:05 ET T: 03/07/2014 13:16:07 ET JOB#: [id number]  cc: Dyann Ruddle A. Baldwin [place], <Dictator> Kelby Fam Lynde Ludwig PA ELECTRONICALLY SIGNED 03/08/2014 10:12

## 2014-06-24 NOTE — Discharge Summary (Signed)
PATIENT NAME:  Hector [name], Hector [name] MR#:  [id number] DATE OF BIRTH:  [birth year]  DATE OF ADMISSION:  03/07/2014 DATE OF DISCHARGE:  03/08/2014  ADMITTING DIAGNOSIS: Chest pain.   DISCHARGE DIAGNOSES: 1.  Chest pain, possibly due to accelerated hypertension. Chronically elevated troponin. No further chest pain. Seen by cardiology. Outpatient stress test.  2.  Medical noncompliance and lack of followup.  3.  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  4.  Hyperlipidemia.  5.  Chronically elevated troponin.  6.  Hypertension.  7.  Chronic kidney disease. Creatinine baseline around 2.48.   DIAGNOSTIC DATA: Glucose 122, BUN 26, creatinine 2.47, sodium 140, potassium 4.4, chloride 103, CO2 28, calcium 8.4. LFTs are normal. Troponin 0.17, 0.13, 0.10. Toxic urine drug screen was opiate positive. Admitting WBC 11.1, hemoglobin 12.2, platelet count 271,000.   Echocardiogram showed EF 20% to 25%, impaired left ventricular diastolic filling, severely dilated left atrium, mildly dilated right atrium, mild to moderate mitral valve regurg, mild to moderate tricuspid regurg.   HOSPITAL COURSE: Please refer to H and P done by me on admission. The patient is a 49 year old who has had multiple ED visits in the past 3 months. These include 3 and 2 hospitalizations. The patient came to the hospital with complaint of chest pain. He had a troponin that is chronically elevated. He was noted to have accelerated hypertension. The patient has had issues with med noncompliance. The patient was admitted to the hospital, serial cardiac enzymes were done. He was seen by Dr. Humphrey Rolls who recommended outpatient stress test. Currently, the patient is not having any chest pain and is doing better. He is stable for discharge.   DISCHARGE MEDICATIONS: Atorvastatin 80 at bedtime, carvedilol 25 one tab p.o. b.i.d., isosorbide mononitrate 60 daily,  ranitidine 150 one tab p.o. b.i.d., lisinopril 40 at bedtime, aspirin 81 one tab p.o. daily, Catapres 0.2 one tab p.o. b.i.d.   DISCHARGE DIET: Low sodium, low fat, low cholesterol.   DISCHARGE ACTIVITY: As tolerated.   DISCHARGE FOLLOWUP: Follow up with primary MD in 1 to 2 weeks and Dr. Humphrey Rolls in 2 to 4 weeks.   TIME SPENT: 35 minutes.  ____________________________ Lafonda Mosses Posey Pronto, MD shp:sb D: 03/09/2014 14:11:51 ET T: 03/09/2014 15:00:56 ET JOB#: [id number]  cc: Amarachi Kotz H. Posey Pronto, MD, <Dictator> Alric Seton MD ELECTRONICALLY SIGNED 03/12/2014 12:44

## 2014-06-24 NOTE — H&P (Signed)
PATIENT NAME:  Hector [name], Hector [name] MR#:  [id number] DATE OF BIRTH:  [birth year]  DATE OF ADMISSION:  03/07/2014  PRIMARY CARE PROVIDER: None.   EMERGENCY DEPARTMENT REFERRING PHYSICIAN: Eryka A. Edd Fabian, MD  CHIEF COMPLAINT: Chest pain.   HISTORY OF PRESENT ILLNESS: The patient is a 49 year old white male with a history of chronic alcoholism in the past, having alcoholic cardiomyopathy, who reports that he does not drink anymore. He has a history of having congestive heart failure, systolic in nature, malignant hypertension, chronically elevated troponin, and presents with onset of left-sided chest pain that woke him up. The patient reports the pain to be very sharp, and has been going on since this morning. He reports the last time he had chest pain was last year. To note, he was admitted on 11/23/2013 for  an upper GI bleed. The patient otherwise denies any shortness of breath. No nausea, vomiting or diarrhea. Denies any urinary symptoms.   PAST MEDICAL HISTORY: 1.  Significant for alcoholic cardiomyopathy with ejection fraction of 20%, history of systolic congestive heart failure, alcohol abuse in the past, stopped about 2 years ago, malignant hypertension and chronic kidney disease. Baseline creatinine around 1.8.  2.  Hyperlipidemia.  3.  Chronically elevated troponin.   PAST SURGICAL HISTORY: Right knee surgery.   SOCIAL HISTORY: Denies smoking. He used to drinks heavily, but now none. Denies any illicit drug use.   FAMILY HISTORY: Mother has a history of diabetes, hypertension, hyperlipidemia.   MEDICATIONS: He is on sodium chloride nasal spray 1 spray q. 2 hours as needed, ranitidine 150 b.i.d., Zofran 4 mg t.i.d., lisinopril 40 daily, isosorbide mononitrate 60 mg 1 tablet p.o. daily, carvedilol 25 mg 1 tablet p.o. b.i.d., atorvastatin 80 at bedtime.   REVIEW OF SYSTEMS:  CONSTITUTIONAL: Denies any fevers or weakness. No pain or weight loss.  EYES: No blurred or double vision.  EARS,  NOSE, AND THROAT: No tinnitus, ear pain. No hearing loss.  RESPIRATORY: Denies any cough, wheezing, hemoptysis.  CARDIOVASCULAR: Complains of chest pain, but no orthopnea, edema, or arrhythmia.  GASTROINTESTINAL: Denies any nausea, vomiting or diarrhea.  GENITOURINARY: Denies any dysuria, hematuria, renal colic or frequency.  ENDOCRINE: Denies any polyuria or nocturia.  SKIN: No rashes.  LYMPH NODES: No lymph node enlargement.  MUSCULOSKELETAL: There is no edema or swelling.  NEUROLOGIC: Denies any CVA, TIA, or seizures.  PSYCHIATRIC: Denies anxiety, insomnia. No bipolar disorder.  PHYSICAL EXAMINATION:  VITAL SIGNS: Temperature 97.9, pulse 82, respirations 18, blood pressure 167/141.  GENERAL: The patient is a well-developed, well-nourished male in no acute distress.  HEENT: Head atraumatic, normocephalic. Pupils equally round, reactive to light and accommodation. There is no conjunctival pallor. No scleral icterus. Nasal exam shows no drainage or ulceration. Oropharynx is clear without any exudate.  NECK: Supple without any thyromegaly.  CARDIOVASCULAR: Regular rate and rhythm. No murmurs, rubs, clicks, or gallops.  LUNGS: Clear to auscultation bilaterally without any rales, rhonchi, or wheezing.  ABDOMEN: Soft, nontender, nondistended. Positive bowel sounds x 4. No hepatosplenomegaly.  EXTREMITIES: No clubbing, cyanosis, or edema.  SKIN: No rash.  LYMPH NODES: None palpable.  VASCULAR: Good DP/PT pulses.  PSYCHIATRIC: Not anxious or depressed. NEUROLOGIC: Awake, alert, oriented x 3. No focal deficits   EVALUATIONS: EKG: Normal sinus rhythm with lateral T-wave inversions.  Laboratories: Glucose 122, BUN 26, creatinine 2.47, sodium 140, potassium 4.4, chloride 103, calcium 8.4. Troponin 0.17. WBC 11.1, hemoglobin 12.2, platelet count 271,000.   ASSESSMENT AND PLAN: The patient is a 49 year old  African American male with history of alcoholic-induced cardiomyopathy who presents with  chest pain.  1.  Chest pain with chronically elevated troponin. At this time, we will start him on aspirin, place him on a nitroglycerin patch. His blood pressure is elevated; could be cause for the chest pain. We will ask cardiology to see him. In light of his recent gastrointestinal bleed, I will not start him on heparin unless his troponin trends higher.  2.  Accelerated hypertension. The patient will be on a nitroglycerin patch. He will be continued on his home regimen with carvedilol  lisinopril. I will start him on hydralazine IV q. 6 hours p.r.n. for elevated blood pressure as well.  3.  Hyperlipidemia. We will continue atorvastatin. Check a fasting lipid panel in the morning.  4.  Gastroesophageal reflux disease. We will continue his ranitidine.  5.  Miscellaneous. The patient will be on Lovenox for deep vein thrombosis prophylaxis.   TIME SPENT ON THIS HISTORY AND PHYSICAL: 50 minutes.   ____________________________ Lafonda Mosses Posey Pronto, MD shp:MT D: 03/07/2014 10:05:28 ET T: 03/07/2014 10:26:07 ET JOB#: [id number]  cc: Emorie Mcfate H. Posey Pronto, MD, <Dictator> Alric Seton MD ELECTRONICALLY SIGNED 03/12/2014 12:43

## 2014-07-02 ENCOUNTER — Encounter: Payer: Self-pay | Admitting: Family

## 2014-07-02 ENCOUNTER — Ambulatory Visit: Payer: Medicaid Other | Attending: Family | Admitting: Family

## 2014-07-02 ENCOUNTER — Encounter (INDEPENDENT_AMBULATORY_CARE_PROVIDER_SITE_OTHER): Payer: Self-pay

## 2014-07-02 VITALS — BP 107/67 | HR 74 | Resp 20 | Ht 71.0 in | Wt 264.0 lb

## 2014-07-02 DIAGNOSIS — R5383 Other fatigue: Secondary | ICD-10-CM | POA: Diagnosis not present

## 2014-07-02 DIAGNOSIS — I509 Heart failure, unspecified: Secondary | ICD-10-CM | POA: Insufficient documentation

## 2014-07-02 DIAGNOSIS — N183 Chronic kidney disease, stage 3 unspecified: Secondary | ICD-10-CM | POA: Insufficient documentation

## 2014-07-02 DIAGNOSIS — I10 Essential (primary) hypertension: Secondary | ICD-10-CM | POA: Insufficient documentation

## 2014-07-02 DIAGNOSIS — I5022 Chronic systolic (congestive) heart failure: Secondary | ICD-10-CM

## 2014-07-02 NOTE — Patient Instructions (Signed)
Continue to weigh daily and call for an overnight weight gain of >2 pounds or a weekly weight gain of >5 pounds.  Bring medications to every visit.

## 2014-07-02 NOTE — Progress Notes (Signed)
   Subjective:    [name], male    DOB: February 04, 1966, 49 y.o.   MRN: [id number]  Congestive Heart Failure Presents for follow-up visit. The disease course has been stable. Pertinent negatives include no chest pressure, edema, orthopnea, palpitations, paroxysmal nocturnal dyspnea or shortness of breath. The symptoms have been stable. Past treatments include ACE inhibitors, beta blockers and salt and fluid restriction. The treatment provided significant relief. Compliance with prior treatments has been good. His past medical history is significant for HTN. There is no history of DVT.      Review of Systems  Constitutional: Negative for appetite change.  HENT: Positive for postnasal drip. Negative for trouble swallowing.   Eyes: Negative for discharge and itching.  Respiratory: Negative for shortness of breath.   Cardiovascular: Negative for palpitations and leg swelling.  Gastrointestinal: Negative for constipation.  Endocrine: Negative.   Genitourinary: Negative.   Musculoskeletal: Negative for neck stiffness.  Skin: Negative.   Allergic/Immunologic: Negative.   Neurological: Negative for dizziness.  Hematological: Negative.   Psychiatric/Behavioral: Negative.        Objective:   Physical Exam  Constitutional: He is oriented to person, place, and time. He appears well-developed and well-nourished.  HENT:  Head: Normocephalic and atraumatic.  Eyes: Conjunctivae are normal. Pupils are equal, round, and reactive to light.  Neck: Normal range of motion. Neck supple.  Cardiovascular: Normal rate and regular rhythm.   Pulmonary/Chest: Effort normal. He has rales.  Abdominal: Soft. There is no tenderness.  Musculoskeletal: He exhibits no edema or tenderness.  Neurological: He is alert and oriented to person, place, and time.  Skin: Skin is warm and dry.  Psychiatric: He has a normal mood and affect.          Assessment & Plan:  1: Chronic heart failure  with reduced ejection fraction- Patient did not bring medications with him but says that he has continued on his Lisinopril because medicaid has yet to authorize entresto. He has a telephone call later today with medicaid regarding this. Will continue lisinopril at this time. He also says that he never began the torsemide as his abdomen got softer so he just continued on the furosemide. Discussed the importance of bringing his medications to every visit. He has gained 10 pounds by our scale since he was here but he says that it's because he's eating too much and he knows it. He is not adding any salt and is trying to continue eating low sodium foods. Feels like he is very active.  2: HTN- Blood pressure looks great today. Continue medications at this time.  3: Fatigue- Patient says that he's sleeping well and he really only gets tired if he does too much exertion. He says that during his normal day, he doesn't get tired.

## 2014-07-26 ENCOUNTER — Telehealth: Payer: Self-pay

## 2014-07-26 NOTE — Telephone Encounter (Signed)
Request from [place] , sent to [place] on 07/26/2014 .

## 2014-07-27 ENCOUNTER — Telehealth: Payer: Self-pay | Admitting: Cardiology

## 2014-07-31 NOTE — Telephone Encounter (Signed)
Close encounter 

## 2014-08-01 ENCOUNTER — Ambulatory Visit: Payer: Medicaid Other | Admitting: Cardiology

## 2014-08-06 ENCOUNTER — Telehealth: Payer: Self-pay

## 2014-08-06 NOTE — Telephone Encounter (Signed)
Request from [place] , sent to [place] on 08/07/2014 .

## 2014-08-14 ENCOUNTER — Emergency Department: Payer: Medicaid Other

## 2014-08-14 ENCOUNTER — Inpatient Hospital Stay
Admission: EM | Admit: 2014-08-14 | Discharge: 2014-08-19 | DRG: 309 | Disposition: A | Discharge status: Home / Self Care | Payer: Medicaid Other | Attending: Internal Medicine | Admitting: Internal Medicine

## 2014-08-14 ENCOUNTER — Encounter: Payer: Self-pay | Admitting: *Deleted

## 2014-08-14 DIAGNOSIS — I4892 Unspecified atrial flutter: Principal | ICD-10-CM | POA: Diagnosis present

## 2014-08-14 DIAGNOSIS — E785 Hyperlipidemia, unspecified: Secondary | ICD-10-CM | POA: Diagnosis present

## 2014-08-14 DIAGNOSIS — R079 Chest pain, unspecified: Secondary | ICD-10-CM | POA: Diagnosis present

## 2014-08-14 DIAGNOSIS — Z6836 Body mass index (BMI) 36.0-36.9, adult: Secondary | ICD-10-CM

## 2014-08-14 DIAGNOSIS — K219 Gastro-esophageal reflux disease without esophagitis: Secondary | ICD-10-CM | POA: Diagnosis present

## 2014-08-14 DIAGNOSIS — R0602 Shortness of breath: Secondary | ICD-10-CM | POA: Insufficient documentation

## 2014-08-14 DIAGNOSIS — R072 Precordial pain: Secondary | ICD-10-CM | POA: Diagnosis present

## 2014-08-14 DIAGNOSIS — Z87891 Personal history of nicotine dependence: Secondary | ICD-10-CM

## 2014-08-14 DIAGNOSIS — F1021 Alcohol dependence, in remission: Secondary | ICD-10-CM | POA: Diagnosis present

## 2014-08-14 DIAGNOSIS — I255 Ischemic cardiomyopathy: Secondary | ICD-10-CM | POA: Diagnosis present

## 2014-08-14 DIAGNOSIS — I4891 Unspecified atrial fibrillation: Secondary | ICD-10-CM | POA: Diagnosis present

## 2014-08-14 DIAGNOSIS — I480 Paroxysmal atrial fibrillation: Secondary | ICD-10-CM | POA: Diagnosis present

## 2014-08-14 DIAGNOSIS — N183 Chronic kidney disease, stage 3 unspecified: Secondary | ICD-10-CM | POA: Diagnosis present

## 2014-08-14 DIAGNOSIS — I471 Supraventricular tachycardia: Secondary | ICD-10-CM | POA: Diagnosis present

## 2014-08-14 DIAGNOSIS — I129 Hypertensive chronic kidney disease with stage 1 through stage 4 chronic kidney disease, or unspecified chronic kidney disease: Secondary | ICD-10-CM | POA: Diagnosis present

## 2014-08-14 DIAGNOSIS — I340 Nonrheumatic mitral (valve) insufficiency: Secondary | ICD-10-CM | POA: Diagnosis present

## 2014-08-14 DIAGNOSIS — I5042 Chronic combined systolic (congestive) and diastolic (congestive) heart failure: Secondary | ICD-10-CM | POA: Diagnosis present

## 2014-08-14 DIAGNOSIS — I426 Alcoholic cardiomyopathy: Secondary | ICD-10-CM | POA: Diagnosis present

## 2014-08-14 DIAGNOSIS — Z833 Family history of diabetes mellitus: Secondary | ICD-10-CM

## 2014-08-14 DIAGNOSIS — I248 Other forms of acute ischemic heart disease: Secondary | ICD-10-CM | POA: Diagnosis present

## 2014-08-14 DIAGNOSIS — Z7982 Long term (current) use of aspirin: Secondary | ICD-10-CM

## 2014-08-14 DIAGNOSIS — Z8249 Family history of ischemic heart disease and other diseases of the circulatory system: Secondary | ICD-10-CM

## 2014-08-14 HISTORY — DX: Chronic kidney disease, stage 3 unspecified: N18.30

## 2014-08-14 HISTORY — DX: Chronic kidney disease, stage 3 (moderate): N18.3

## 2014-08-14 LAB — URINALYSIS COMPLETE WITH MICROSCOPIC (ARMC ONLY)
BILIRUBIN URINE: NEGATIVE
Bacteria, UA: NONE SEEN
Glucose, UA: NEGATIVE mg/dL
Ketones, ur: NEGATIVE mg/dL
Nitrite: NEGATIVE
Protein, ur: >500 mg/dL — AB
SPECIFIC GRAVITY, URINE: 1.011 (ref 1.005–1.030)
pH: 5 (ref 5.0–8.0)

## 2014-08-14 LAB — CBC WITH DIFFERENTIAL/PLATELET
BASOS PCT: 1 %
Basophils Absolute: 0.1 10*3/uL (ref 0–0.1)
EOS ABS: 0.1 10*3/uL (ref 0–0.7)
EOS PCT: 2 %
HEMATOCRIT: 44 % (ref 40.0–52.0)
Hemoglobin: 13.9 g/dL (ref 13.0–18.0)
LYMPHS PCT: 20 %
Lymphs Abs: 1.8 10*3/uL (ref 1.0–3.6)
MCH: 25.3 pg — ABNORMAL LOW (ref 26.0–34.0)
MCHC: 31.5 g/dL — ABNORMAL LOW (ref 32.0–36.0)
MCV: 80.3 fL (ref 80.0–100.0)
MONO ABS: 0.7 10*3/uL (ref 0.2–1.0)
Monocytes Relative: 8 %
Neutro Abs: 6.5 10*3/uL (ref 1.4–6.5)
Neutrophils Relative %: 69 %
PLATELETS: 228 10*3/uL (ref 150–440)
RBC: 5.48 MIL/uL (ref 4.40–5.90)
RDW: 22.5 % — ABNORMAL HIGH (ref 11.5–14.5)
WBC: 9.2 10*3/uL (ref 3.8–10.6)

## 2014-08-14 LAB — COMPREHENSIVE METABOLIC PANEL
ALBUMIN: 3.6 g/dL (ref 3.5–5.0)
ALK PHOS: 76 U/L (ref 38–126)
ALT: 17 U/L (ref 17–63)
ANION GAP: 8 (ref 5–15)
AST: 26 U/L (ref 15–41)
BILIRUBIN TOTAL: 1.2 mg/dL (ref 0.3–1.2)
BUN: 32 mg/dL — ABNORMAL HIGH (ref 6–20)
CALCIUM: 8.4 mg/dL — AB (ref 8.9–10.3)
CO2: 23 mmol/L (ref 22–32)
CREATININE: 2.39 mg/dL — AB (ref 0.61–1.24)
Chloride: 105 mmol/L (ref 101–111)
GFR calc non Af Amer: 30 mL/min — ABNORMAL LOW (ref >60)
GFR, EST AFRICAN AMERICAN: 35 mL/min — AB (ref >60)
Glucose, Bld: 131 mg/dL — ABNORMAL HIGH (ref 65–99)
Potassium: 4 mmol/L (ref 3.5–5.1)
SODIUM: 136 mmol/L (ref 135–145)
TOTAL PROTEIN: 6.9 g/dL (ref 6.5–8.1)

## 2014-08-14 LAB — PHOSPHORUS: Phosphorus: 4 mg/dL (ref 2.5–4.6)

## 2014-08-14 LAB — TROPONIN I
TROPONIN I: 0.12 ng/mL — AB (ref <0.031)
TROPONIN I: 0.11 ng/mL — AB (ref <0.031)
Troponin I: 0.12 ng/mL — ABNORMAL HIGH (ref <0.031)

## 2014-08-14 LAB — TSH: TSH: 2.326 u[IU]/mL (ref 0.350–4.500)

## 2014-08-14 LAB — MAGNESIUM: MAGNESIUM: 1.7 mg/dL (ref 1.7–2.4)

## 2014-08-14 IMAGING — CR DG CHEST 1V PORT
1 series · 1 of 1 positions shown · non-contrast
Comparison: [DATE]

CLINICAL DATA: Chest pain

EXAM:
PORTABLE CHEST - 1 VIEW

[portable]
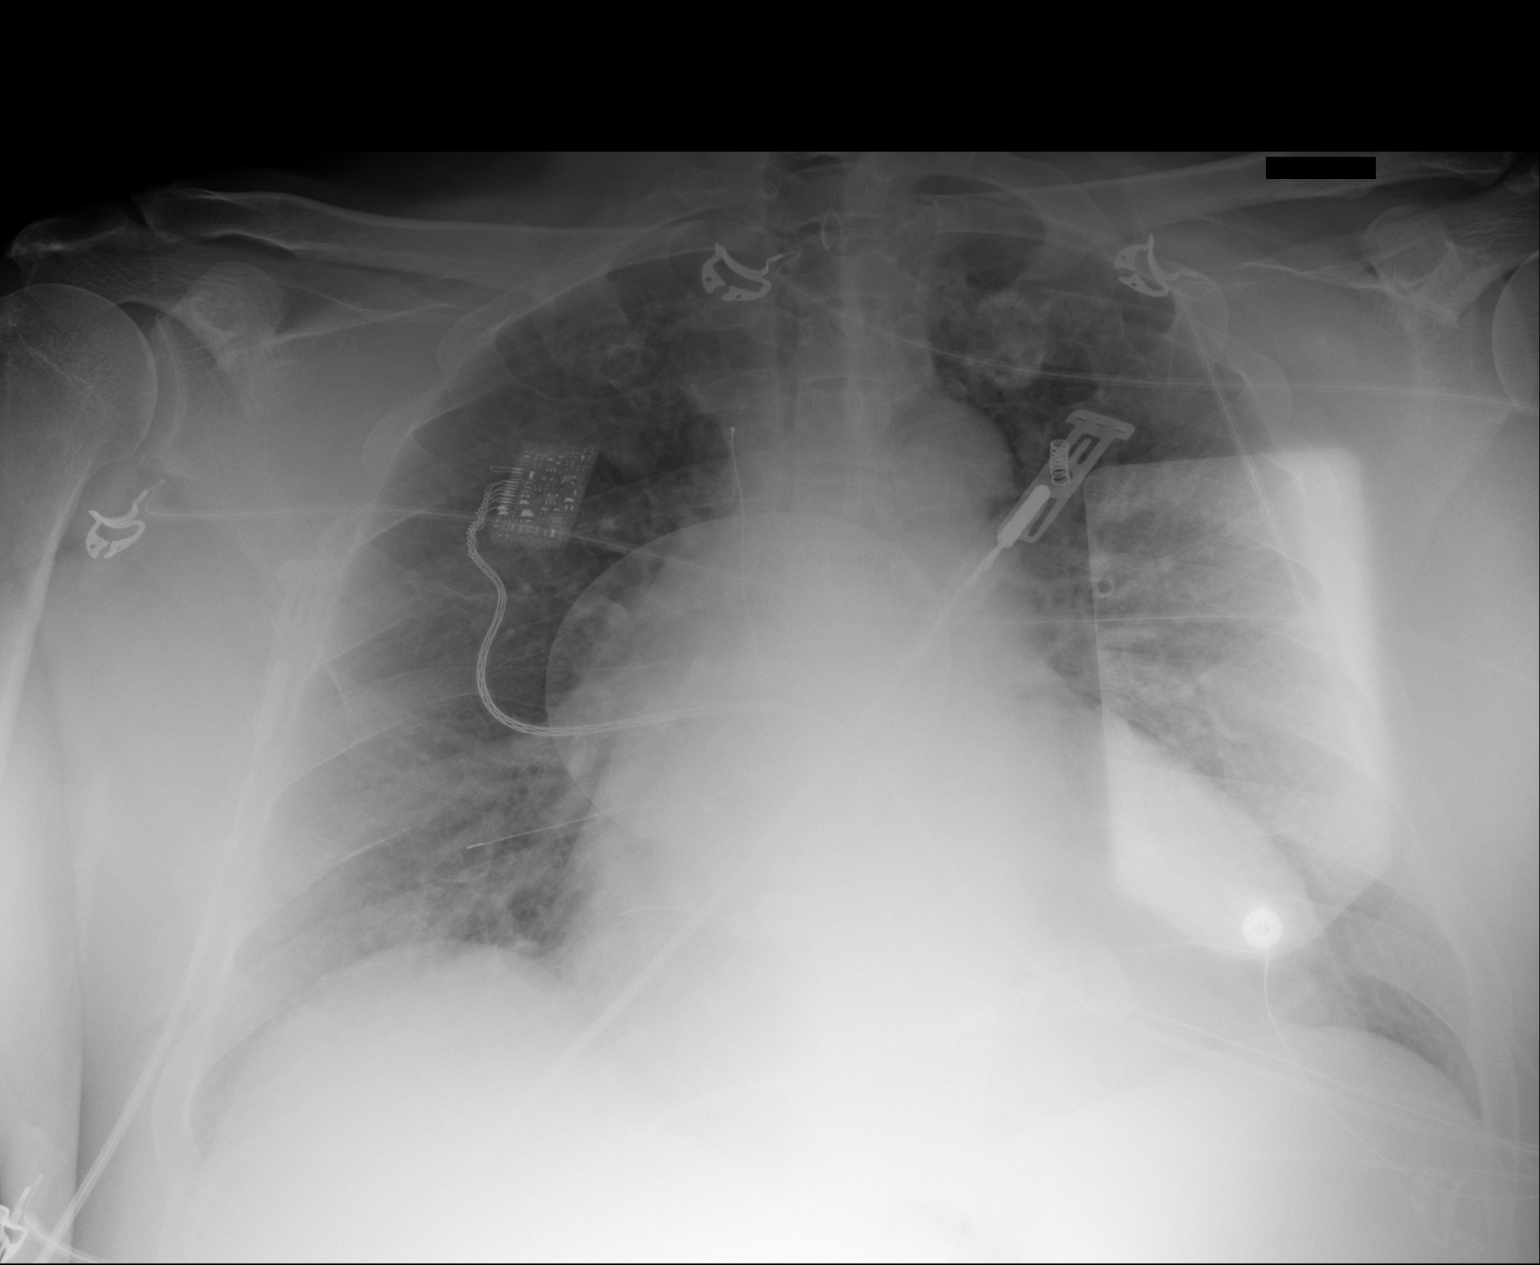

[1 of 1 positions shown; findings below may reference images not displayed]

FINDINGS: Cardiac enlargement. Vascular congestion with fluid overload.
Negative for edema. No pleural effusion.
IMPRESSION: Cardiac enlargement with mild heart failure.  Negative for edema.

## 2014-08-14 MED ORDER — ACETAMINOPHEN 325 MG PO TABS
650.0000 mg | ORAL_TABLET | Freq: Four times a day (QID) | ORAL | Status: DC | PRN
  Administered 2014-08-14 – 2014-08-18 (×2): 650 mg via ORAL
  Filled 2014-08-14 (×2): qty 2

## 2014-08-14 MED ORDER — METOPROLOL TARTRATE 1 MG/ML IV SOLN
5.0000 mg | Freq: Once | INTRAVENOUS | Status: AC
  Administered 2014-08-14: 5 mg via INTRAVENOUS

## 2014-08-14 MED ORDER — ATORVASTATIN CALCIUM 20 MG PO TABS
80.0000 mg | ORAL_TABLET | Freq: Every day | ORAL | Status: DC
  Administered 2014-08-15 – 2014-08-19 (×4): 80 mg via ORAL
  Filled 2014-08-14: qty 4
  Filled 2014-08-14 (×2): qty 1
  Filled 2014-08-14: qty 4
  Filled 2014-08-14 (×2): qty 1

## 2014-08-14 MED ORDER — NITROGLYCERIN 0.4 MG SL SUBL
0.4000 mg | SUBLINGUAL_TABLET | SUBLINGUAL | Status: DC | PRN
  Administered 2014-08-14: 0.4 mg via SUBLINGUAL

## 2014-08-14 MED ORDER — LISINOPRIL 20 MG PO TABS: 40.0000 mg | ORAL_TABLET | Freq: Every day | ORAL | Status: DC

## 2014-08-14 MED ORDER — DILTIAZEM LOAD VIA INFUSION
20.0000 mg | Freq: Once | INTRAVENOUS | Status: AC
  Filled 2014-08-14: qty 20

## 2014-08-14 MED ORDER — FUROSEMIDE 40 MG PO TABS
40.0000 mg | ORAL_TABLET | Freq: Every day | ORAL | Status: DC
  Administered 2014-08-15 – 2014-08-19 (×5): 40 mg via ORAL
  Filled 2014-08-14 (×6): qty 1

## 2014-08-14 MED ORDER — NITROGLYCERIN 0.4 MG SL SUBL
SUBLINGUAL_TABLET | SUBLINGUAL | Status: AC
  Administered 2014-08-14: 0.4 mg via SUBLINGUAL
  Filled 2014-08-14: qty 1

## 2014-08-14 MED ORDER — SACUBITRIL-VALSARTAN 24-26 MG PO TABS
1.0000 | ORAL_TABLET | Freq: Two times a day (BID) | ORAL | Status: DC
  Administered 2014-08-14 – 2014-08-19 (×9): 1 via ORAL
  Filled 2014-08-14 (×9): qty 1

## 2014-08-14 MED ORDER — PANTOPRAZOLE SODIUM 40 MG PO TBEC
40.0000 mg | DELAYED_RELEASE_TABLET | Freq: Every day | ORAL | Status: DC
  Administered 2014-08-15 – 2014-08-19 (×5): 40 mg via ORAL
  Filled 2014-08-14 (×5): qty 1

## 2014-08-14 MED ORDER — DILTIAZEM HCL 25 MG/5ML IV SOLN
INTRAVENOUS | Status: AC
  Filled 2014-08-14: qty 5

## 2014-08-14 MED ORDER — SODIUM CHLORIDE 0.9 % IV BOLUS (SEPSIS)
1000.0000 mL | Freq: Once | INTRAVENOUS | Status: AC
  Administered 2014-08-14: 1000 mL via INTRAVENOUS

## 2014-08-14 MED ORDER — CARVEDILOL 25 MG PO TABS
25.0000 mg | ORAL_TABLET | Freq: Two times a day (BID) | ORAL | Status: DC
  Administered 2014-08-15 – 2014-08-19 (×7): 25 mg via ORAL
  Filled 2014-08-14 (×8): qty 1

## 2014-08-14 MED ORDER — DILTIAZEM HCL 100 MG IV SOLR
5.0000 mg/h | Freq: Once | INTRAVENOUS | Status: AC
  Administered 2014-08-14: 5 mg/h via INTRAVENOUS
  Filled 2014-08-14: qty 100

## 2014-08-14 MED ORDER — METOPROLOL TARTRATE 1 MG/ML IV SOLN
10.0000 mg | Freq: Once | INTRAVENOUS | Status: AC
  Administered 2014-08-14: 10 mg via INTRAVENOUS

## 2014-08-14 MED ORDER — METOPROLOL TARTRATE 1 MG/ML IV SOLN
INTRAVENOUS | Status: AC
  Filled 2014-08-14: qty 5

## 2014-08-14 MED ORDER — ACETAMINOPHEN 650 MG RE SUPP
650.0000 mg | Freq: Four times a day (QID) | RECTAL | Status: DC | PRN
Start: 2014-08-14 — End: 2014-08-19

## 2014-08-14 MED ORDER — ONDANSETRON HCL 4 MG PO TABS: 4.0000 mg | ORAL_TABLET | Freq: Four times a day (QID) | ORAL | Status: DC | PRN

## 2014-08-14 MED ORDER — ADENOSINE 12 MG/4ML IV SOLN
12.0000 mg | Freq: Once | INTRAVENOUS | Status: AC
  Administered 2014-08-14: 12 mg via INTRAVENOUS

## 2014-08-14 MED ORDER — ADENOSINE 6 MG/2ML IV SOLN
INTRAVENOUS | Status: AC
  Filled 2014-08-14: qty 4

## 2014-08-14 MED ORDER — ADENOSINE 12 MG/4ML IV SOLN
INTRAVENOUS | Status: AC
  Administered 2014-08-14: 12 mg via INTRAVENOUS
  Filled 2014-08-14: qty 4

## 2014-08-14 MED ORDER — OXYCODONE-ACETAMINOPHEN 5-325 MG PO TABS
1.0000 | ORAL_TABLET | Freq: Once | ORAL | Status: AC
Start: 2014-08-14 — End: 2014-08-14
  Administered 2014-08-14: 1 via ORAL

## 2014-08-14 MED ORDER — APIXABAN 5 MG PO TABS
2.5000 mg | ORAL_TABLET | Freq: Two times a day (BID) | ORAL | Status: DC
  Administered 2014-08-14 – 2014-08-15 (×3): 2.5 mg via ORAL
  Filled 2014-08-14 (×3): qty 1

## 2014-08-14 MED ORDER — OXYCODONE-ACETAMINOPHEN 5-325 MG PO TABS
ORAL_TABLET | ORAL | Status: AC
  Filled 2014-08-14: qty 1

## 2014-08-14 MED ORDER — HEPARIN SODIUM (PORCINE) 5000 UNIT/ML IJ SOLN
5000.0000 [IU] | Freq: Three times a day (TID) | INTRAMUSCULAR | Status: DC
  Administered 2014-08-14: 5000 [IU] via SUBCUTANEOUS
  Filled 2014-08-14: qty 1

## 2014-08-14 MED ORDER — ONDANSETRON HCL 4 MG/2ML IJ SOLN: 4.0000 mg | Freq: Four times a day (QID) | INTRAMUSCULAR | Status: DC | PRN

## 2014-08-14 NOTE — ED Notes (Signed)
CRITICAL VALUE ALERT  Critical value received:  Troponin 0.12  Date of notification:  08/14/2014  Time of notification:  [id number]  Critical value read back:Yes.    Nurse who received alert:  Musician  MD notified (1st page):  Joni Fears  Time of first page:  1332  MD notified (2nd page):  Time of second page:  Responding MD:  [place]  Time MD responded:  1332

## 2014-08-14 NOTE — ED Notes (Signed)
IV bolus of Cardizem given from Pyxis per Dr Joni Fears

## 2014-08-14 NOTE — Progress Notes (Signed)
VSS. Room air. NSR. Skin assessed by Diona Foley. Pt reports no pain. Ambulating in the room and tolerating it well. Pt has no further concerns at this time.

## 2014-08-14 NOTE — H&P (Signed)
[facility] [place] NAME: Hector [name]    MR#:  [id number]  DATE OF BIRTH:  [birth year]  DATE OF ADMISSION:  08/14/2014  PRIMARY CARE PHYSICIAN: [name], MD   REQUESTING/REFERRING PHYSICIAN: Dr. Brenton Grills  CHIEF COMPLAINT:   Chief Complaint  Patient presents with  . Chest Pain   chest pain, palpitations, chills  HISTORY OF PRESENT ILLNESS:  Hector [name]  is a 49 y.o. 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 Patient also complained of some chest pain the left side of his chest and nonradiating and not associated with any nausea vomiting diaphoresis. Patient presented to the ER was noted to have heart rates in the 150s and was given IV metoprolol boluses without much improvement, he was then placed on a Cardizem drip and has since then his heart rate has improved in the 80s to 90s. Patient was also noted to have an elevated troponin of 0.12 and therefore hospitalist services were contacted for further treatment and evaluation.  Patient denies any other associated symptoms of abdominal pain, melena, hematochezia, dysuria, hematuria, weight gain/weight loss, or any other symptoms presently.  PAST MEDICAL HISTORY:   Past Medical History  Diagnosis Date  . Hyperlipidemia   . Elevated troponin     Chronically elevated. - level was 0.17-0.10 during recent admission  . Alcoholic cardiomyopathy 123XX123    a) Myoview: 11/'09: EF 28% with globally decreased function. No inducible ischemia, fixed inferoapical defect = Infarct vs.artifact (not commensurate with extent of reduced EF); b) Echo 8/'12: Mild LV dilation, Mod-Severe global hypokinesis - EF 25-35%. Mild-moderate RV  dilation no significant bowel lesion mild-moderate MR; c) Echo Jan 2016: EF 20-25%, high LAP, mild-moderate LV dilation, severe L  . Chronic combined systolic and diastolic heart failure, NYHA class 2   . Alcohol abuse   . Essential hypertension   . CKD (chronic kidney disease)   . CHF (congestive heart failure)   . GI bleed 11/2013  . Myocardial infarct 2004  . Pancreatitis     PAST SURGICAL HISTORY:   Past Surgical History  Procedure Laterality Date  . Knee surgery Right   . Nm myoview ltd  November 2011    No ischemia or infarction. EF 50-55% ( no improvement from 2009 Myoview EF of 28%    SOCIAL HISTORY:   History  Substance Use Topics  . Smoking status: Former Smoker -- 1.00 packs/day for 12 years    Quit date: 02/23/1998  . Smokeless tobacco: Never Used  . Alcohol Use: No     Comment: 4 heavy alcohol drinker. Quit 2 years ago.    FAMILY HISTORY:   Family History  Problem Relation Age of Onset  . Hypertension Mother   . Hyperlipidemia Mother   . Diabetes Mother     DRUG ALLERGIES:   Allergies  Allergen Reactions  . Eggs Or Egg-Derived Products Rash    REVIEW OF SYSTEMS:   Review of Systems  Constitutional: Negative for fever and weight loss.  HENT: Negative for congestion, nosebleeds and tinnitus.   Eyes: Negative for blurred vision, double vision and redness.  Respiratory: Negative for cough, hemoptysis and shortness of breath.   Cardiovascular: Positive for chest pain and palpitations. Negative for orthopnea,  leg swelling and PND.  Gastrointestinal: Negative for nausea, vomiting, abdominal pain, diarrhea and melena.  Genitourinary: Negative for dysuria, urgency and hematuria.  Musculoskeletal: Negative for joint pain and falls.  Skin: Negative for rash.  Neurological: Negative for dizziness, tingling, sensory change, focal weakness, seizures, weakness and headaches.  Endo/Heme/Allergies: Negative for polydipsia. Does not bruise/bleed easily.   Psychiatric/Behavioral: Negative for depression and memory loss. The patient is not nervous/anxious.     MEDICATIONS AT HOME:   Prior to Admission medications   Medication Sig Start Date End Date Taking? Authorizing Provider  aspirin EC 81 MG tablet Take 81 mg by mouth daily.   Yes Historical Provider, MD  atorvastatin (LIPITOR) 80 MG tablet Take 80 mg by mouth daily.   Yes Historical Provider, MD  carvedilol (COREG) 25 MG tablet Take 25 mg by mouth 2 (two) times daily with a meal.   Yes Historical Provider, MD  furosemide (LASIX) 40 MG tablet Take 40 mg by mouth daily.   Yes Historical Provider, MD  lisinopril (PRINIVIL,ZESTRIL) 40 MG tablet Take 40 mg by mouth daily.   Yes Historical Provider, MD  omeprazole (PRILOSEC) 20 MG capsule Take 20 mg by mouth daily.   Yes Historical Provider, MD  sacubitril-valsartan (ENTRESTO) 24-26 MG Take 1 tablet by mouth 2 (two) times daily. 05/15/14  Yes Alisa Graff, FNP      VITAL SIGNS:  Blood pressure 107/93, pulse 67, temperature 98.6 F (37 C), temperature source Oral, resp. rate 24, height 5\' 11"  (1.803 m), weight 119.296 kg (263 lb), SpO2 95 %.  PHYSICAL EXAMINATION:  Physical Exam  GENERAL:  49 y.o.-year-old patient lying in the bed with no acute distress.  EYES: Pupils equal, round, reactive to light and accommodation. No scleral icterus. Extraocular muscles intact.  HEENT: Head atraumatic, normocephalic. Oropharynx and nasopharynx clear. No oropharyngeal erythema, moist oral mucosa  NECK:  Supple, no jugular venous distention. No thyroid enlargement, no tenderness.  LUNGS: Normal breath sounds bilaterally, no wheezing, rales, rhonchi. No use of accessory muscles of respiration.  CARDIOVASCULAR: S1, S2 Irregular pulse & rate. No murmurs, rubs, gallops, clicks.  ABDOMEN: Soft, nontender, nondistended. Bowel sounds present. No organomegaly or mass.  EXTREMITIES: No pedal edema, cyanosis, or clubbing. + 2 pedal & radial pulses b/l.    NEUROLOGIC: Cranial nerves II through XII are intact. No focal Motor or sensory deficits appreciated b/l PSYCHIATRIC: The patient is alert and oriented x 3. Good affect.  SKIN: No obvious rash, lesion, or ulcer.   LABORATORY PANEL:   CBC  Recent Labs Lab 08/14/14 1217  WBC 9.2  HGB 13.9  HCT 44.0  PLT 228   ------------------------------------------------------------------------------------------------------------------  Chemistries   Recent Labs Lab 08/14/14 1217  NA 136  K 4.0  CL 105  CO2 23  GLUCOSE 131*  BUN 32*  CREATININE 2.39*  CALCIUM 8.4*  MG 1.7  AST 26  ALT 17  ALKPHOS 76  BILITOT 1.2   ------------------------------------------------------------------------------------------------------------------  Cardiac Enzymes  Recent Labs Lab 08/14/14 1217  TROPONINI 0.12*   ------------------------------------------------------------------------------------------------------------------  RADIOLOGY:  Dg Chest Portable 1 View  08/14/2014   CLINICAL DATA:  Chest pain  EXAM: PORTABLE CHEST - 1 VIEW  COMPARISON:  03/07/2014  FINDINGS: Cardiac enlargement. Vascular congestion with fluid overload. Negative for edema. No pleural effusion.  IMPRESSION: Cardiac enlargement with mild heart failure.  Negative for edema.   Electronically Signed   By: Franchot Gallo M.D.   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  #1 atrial flutter with RVR-patient's heart rate has improved after getting some IV boluses of metoprolol and being placed on a Cardizem drip. Patient has been off the Cardizem drip now invasive controlled. -We'll continue oral Coreg for  now. Continue aspirin  -We'll get cardiology consult and patient has had echo recently which showed EF of 25-30%.  #2 chest pain with elevated troponin-likely not non-ST elevation MI but demand ischemia due to atrial flutter. -Observe on telemetry, follow cardiac markers. -Continue aspirin, Coreg, atorvastatin. We'll get cardiology consult.  #3 history of CHF/systolic-continue Lasix, Coreg, Entresto.  #4 GERD-continue Protonix.  #5 hyperlipidemia-continue atorvastatin.    All the records are reviewed and case discussed with ED provider. Management plans discussed with the patient, family and they are in agreement.  CODE STATUS: Full  TOTAL TIME TAKING CARE OF THIS PATIENT: 45 minutes.    Henreitta Leber M.D on 08/14/2014 at 2:15 PM  Between 7am to 6pm - Pager - [phone]  After 6pm go to [email or web address] - password [email or web address] [facility]  Office  [phone]  CC: Primary care physician; [name], MD

## 2014-08-14 NOTE — Progress Notes (Signed)
MD Verdell Carmine was notified that HR was in the 40s, instructed to not give coreg. Pt has no further concerns at this time.

## 2014-08-14 NOTE — ED Provider Notes (Signed)
[facility] Emergency Department Provider Note  ____________________________________________  Time seen: 11:55 AM on arrival by EMS  I have reviewed the triage vital signs and the nursing notes.   HISTORY  Chief Complaint Chest Pain    HPI Hector [name] is a 49 y.o. male who complains of chest pain 2 days. EMS was called and found the patient to be in SVT which did not respond to 2 doses of adenosine IV. Patient reports he's had this once before. He reports compliance with all his medications and has not run out of any medicines. He has a history of CHF.  He denies alcohol or tobacco use recently, no cocaine use, no stimulant cough medicine or other medication changes. Reports that he does drink soda but no excessive caffeine intake. Chest pain is mid chest constant sharp nonradiating not pleuritic, associated with shortness of breath and no dizziness. No vomiting or diaphoresis.     Past Medical History  Diagnosis Date  . Hyperlipidemia   . Elevated troponin     Chronically elevated. - level was 0.17-0.10 during recent admission  . Alcoholic cardiomyopathy 123XX123    a) Myoview: 11/'09: EF 28% with globally decreased function. No inducible ischemia, fixed inferoapical defect = Infarct vs.artifact (not commensurate with extent of reduced EF); b) Echo 8/'12: Mild LV dilation, Mod-Severe global hypokinesis - EF 25-35%. Mild-moderate RV dilation no significant bowel lesion mild-moderate MR; c) Echo Jan 2016: EF 20-25%, high LAP, mild-moderate LV dilation, severe L  . Chronic combined systolic and diastolic heart failure, NYHA class 2   . Alcohol abuse   . Essential hypertension   . CKD (chronic kidney disease)   . CHF (congestive heart failure)   . GI bleed 11/2013  . Myocardial infarct 2004  . Pancreatitis     Patient Active Problem List   Diagnosis Date Noted  . Chronic kidney disease (CKD), stage III (moderate) 07/02/2014  . Chronic combined  systolic and diastolic heart failure, NYHA class 2   . Essential hypertension   . Hyperlipidemia     Past Surgical History  Procedure Laterality Date  . Knee surgery Right   . Nm myoview ltd  November 2011    No ischemia or infarction. EF 50-55% ( no improvement from 2009 Myoview EF of 28%    Current Outpatient Rx  Name  Route  Sig  Dispense  Refill  . aspirin 81 MG tablet   Oral   Take 81 mg by mouth daily.         Marland Kitchen atorvastatin (LIPITOR) 80 MG tablet   Oral   Take 80 mg by mouth daily.         . carvedilol (COREG) 25 MG tablet   Oral   Take 25 mg by mouth 2 (two) times daily with a meal.         . furosemide (LASIX) 40 MG tablet   Oral   Take 40 mg by mouth daily.         Marland Kitchen lisinopril (PRINIVIL,ZESTRIL) 40 MG tablet   Oral   Take 40 mg by mouth daily.         Marland Kitchen omeprazole (PRILOSEC) 20 MG capsule   Oral   Take 20 mg by mouth daily.         . sacubitril-valsartan (ENTRESTO) 24-26 MG   Oral   Take 1 tablet by mouth 2 (two) times daily.           Allergies Eggs or egg-derived  products  Family History  Problem Relation Age of Onset  . Hypertension Mother   . Hyperlipidemia Mother   . Diabetes Mother     Social History History  Substance Use Topics  . Smoking status: Former Smoker -- 0.50 packs/day for 8 years    Quit date: 02/23/1998  . Smokeless tobacco: Never Used  . Alcohol Use: No     Comment: 4 heavy alcohol drinker. Quit 2 years ago.    Review of Systems  Constitutional: No fever or chills. No weight changes Eyes:No blurry vision or double vision.  ENT: No sore throat. Cardiovascular: Positive chest pain. Respiratory: As above Gastrointestinal: Negative for abdominal pain, vomiting and diarrhea.  No BRBPR or melena. Genitourinary: Negative for dysuria, urinary retention, bloody urine, or difficulty urinating. Musculoskeletal: Negative for back pain. No joint swelling or pain. Skin: Negative for rash. Neurological: Negative  for headaches, focal weakness or numbness. Psychiatric:No anxiety or depression.   Endocrine:No hot/cold intolerance, changes in energy, or sleep difficulty.  10-point ROS otherwise negative.  ____________________________________________   PHYSICAL EXAM:  VITAL SIGNS: ED Triage Vitals  Enc Vitals Group     BP 08/14/14 1200 158/148 mmHg     Pulse Rate 08/14/14 1200 74     Resp 08/14/14 1200 28     Temp 08/14/14 1201 98.6 F (37 C)     Temp Source 08/14/14 1201 Oral     SpO2 08/14/14 1200 96 %     Weight 08/14/14 1201 263 lb (119.296 kg)     Height 08/14/14 1201 5\' 11"  (1.803 m)     Head Cir --      Peak Flow --      Pain Score 08/14/14 1212 6     Pain Loc --      Pain Edu? --      Excl. in [place]? --      Constitutional: Alert and oriented. Mild distress Eyes: No scleral icterus. No conjunctival pallor. PERRL. EOMI ENT   Head: Normocephalic and atraumatic.   Nose: No congestion/rhinnorhea. No septal hematoma   Mouth/Throat: MMM, no pharyngeal erythema. No peritonsillar mass. No uvula shift.   Neck: No stridor. No SubQ emphysema. No meningismus. Hematological/Lymphatic/Immunilogical: No cervical lymphadenopathy. Cardiovascular: Tachycardia heart rate 150. Normal and symmetric distal pulses are present in all extremities. No murmurs, rubs, or gallops. Respiratory: Normal respiratory effort without tachypnea nor retractions. Breath sounds are clear and equal bilaterally. No wheezes/rales/rhonchi. Gastrointestinal: Soft and nontender. No distention. There is no CVA tenderness.  No rebound, rigidity, or guarding. Genitourinary: deferred Musculoskeletal: Nontender with normal range of motion in all extremities. No joint effusions.  No lower extremity tenderness.  No edema. Neurologic:   Normal speech and language.  CN 2-10 normal. Motor grossly intact. No pronator drift.  Normal gait. No gross focal neurologic deficits are appreciated.  Skin:  Skin is warm, dry  and intact. No rash noted.  No petechiae, purpura, or bullae. Psychiatric: Mood and affect are normal. Speech and behavior are normal. Patient exhibits appropriate insight and judgment.  ____________________________________________    LABS (pertinent positives/negatives) (all labs ordered are listed, but only abnormal results are displayed) Labs Reviewed  URINALYSIS COMPLETEWITH MICROSCOPIC ([facility] ONLY) - Abnormal; Notable for the following:    Color, Urine YELLOW (*)    APPearance CLEAR (*)    Hgb urine dipstick 2+ (*)    Protein, ur >500 (*)    Leukocytes, UA 2+ (*)    Squamous Epithelial / LPF 0-5 (*)  All other components within normal limits  CBC WITH DIFFERENTIAL/PLATELET - Abnormal; Notable for the following:    MCH 25.3 (*)    MCHC 31.5 (*)    RDW 22.5 (*)    All other components within normal limits  TROPONIN I - Abnormal; Notable for the following:    Troponin I 0.12 (*)    All other components within normal limits  COMPREHENSIVE METABOLIC PANEL - Abnormal; Notable for the following:    Glucose, Bld 131 (*)    BUN 32 (*)    Creatinine, Ser 2.39 (*)    Calcium 8.4 (*)    GFR calc non Af Amer 30 (*)    GFR calc Af Amer 35 (*)    All other components within normal limits  MAGNESIUM  PHOSPHORUS  TSH   ____________________________________________   EKG  Atrial flutter rate 150. Left axis, normal intervals, poor R progression's in anterior precordial leads, narrow QRS, normal ST segments and T waves.  Repeat EKG at 12:51 PM interpreted by me Atrial flutter rate of 71, left axis normal intervals poor R progression normal ST segments and T waves. ____________________________________________    RADIOLOGY  Chest x-ray reveals Mild heart failure no pulmonary edema  ____________________________________________   PROCEDURES CRITICAL CARE Performed by: Joni Fears, Lamont Tant   Total critical care time: 35 minutes  Critical care time was exclusive of separately  billable procedures and treating other patients.  Critical care was necessary to treat or prevent imminent or life-threatening deterioration.  Critical care was time spent personally by me on the following activities: development of treatment plan with patient and/or surrogate as well as nursing, discussions with consultants, evaluation of patient's response to treatment, examination of patient, obtaining history from patient or surrogate, ordering and performing treatments and interventions, ordering and review of laboratory studies, ordering and review of radiographic studies, pulse oximetry and re-evaluation of patient's condition.  ____________________________________________   INITIAL IMPRESSION / ASSESSMENT AND PLAN / ED COURSE  Pertinent labs & imaging results that were available during my care of the patient were reviewed by me and considered in my medical decision making (see chart for details).  On arrival Valsalva maneuvers were attempted with carotid massage and abdominal Valsalva without success. Blood pressure was adequate. The patient was given adenosine 12 mg IV rapid push without effect. There is no discernible changes on rhythm strip. He is then given metoprolol 5+5+10 mg in subsequent doses since he is already on beta blockers at baseline and has taken his Coreg this morning. ----------------------------------------- 12:36 PM on 08/14/2014 -----------------------------------------  No effect from beta blockers even though last dose was given 15 minutes ago. We'll start on diltiazem bolus plus drip.  Benefits of using calcium channel blocker outweighed the risks of combination calcium channel blocker plus beta blocker due to the patient's resistance to beta blocker therapy at this time and refractory tachycardia. His symptoms are worsening with chest pain and diaphoresis. Blood pressure remains stable for now.  ----------------------------------------- 12:58 PM on  08/14/2014 -----------------------------------------  With diltiazem bolus and drip started, heart rate is now 71 in a flutter, low pressure stable at about 110/60. Patient's symptoms are improved. We'll continue to monitor and plan to admit for further management.  ----------------------------------------- 1:45 PM on 08/14/2014 ----------------------------------------- Patient given nitroglycerin for his chest pain shortness of breath and diaphoresis which resolved the symptoms. He was then given Percocet for his persistent chronic back pain. Low suspicion of dissection or AAA. No evidence of pneumothorax or PE. We'll  attempt to wean the patient off the diltiazem drip as he has remained rate controlled with a heart rate of about 70 for the past hour. I discussed his case with the hospitalist who will evaluate the patient for admission.  ____________________________________________   FINAL CLINICAL IMPRESSION(S) / ED DIAGNOSES  Final diagnoses:  Atrial flutter with rapid ventricular response  Precordial chest pain      Carrie Mew, MD 08/14/14 1348

## 2014-08-14 NOTE — ED Notes (Signed)
Pt reports having chest pain since Sunday, EMS gave pt 6&12 of Adenosine with no change, heart rate 150 upon arrival to ED

## 2014-08-15 ENCOUNTER — Encounter: Payer: Self-pay | Admitting: Physician Assistant

## 2014-08-15 DIAGNOSIS — R079 Chest pain, unspecified: Secondary | ICD-10-CM

## 2014-08-15 DIAGNOSIS — N183 Chronic kidney disease, stage 3 (moderate): Secondary | ICD-10-CM

## 2014-08-15 DIAGNOSIS — I5042 Chronic combined systolic (congestive) and diastolic (congestive) heart failure: Secondary | ICD-10-CM | POA: Diagnosis not present

## 2014-08-15 DIAGNOSIS — I4892 Unspecified atrial flutter: Secondary | ICD-10-CM | POA: Diagnosis not present

## 2014-08-15 LAB — TROPONIN I: TROPONIN I: 0.09 ng/mL — AB (ref <0.031)

## 2014-08-15 MED ORDER — APIXABAN 5 MG PO TABS
5.0000 mg | ORAL_TABLET | Freq: Two times a day (BID) | ORAL | Status: DC
  Administered 2014-08-15 – 2014-08-19 (×8): 5 mg via ORAL
  Filled 2014-08-15 (×8): qty 1

## 2014-08-15 MED ORDER — CIPROFLOXACIN HCL 500 MG PO TABS: 500.0000 mg | ORAL_TABLET | Freq: Two times a day (BID) | ORAL | Status: DC

## 2014-08-15 MED ORDER — SODIUM CHLORIDE 0.9 % IJ SOLN
3.0000 mL | Freq: Two times a day (BID) | INTRAMUSCULAR | Status: DC
  Administered 2014-08-15 (×2): 3 mL via INTRAVENOUS

## 2014-08-15 MED ORDER — SODIUM CHLORIDE 0.9 % IV SOLN: 250.0000 mL | INTRAVENOUS | Status: DC

## 2014-08-15 MED ORDER — SODIUM CHLORIDE 0.9 % IV SOLN
INTRAVENOUS | Status: DC
  Administered 2014-08-16: 10:00:00 via INTRAVENOUS

## 2014-08-15 MED ORDER — SODIUM CHLORIDE 0.9 % IJ SOLN: 3.0000 mL | INTRAMUSCULAR | Status: DC | PRN

## 2014-08-15 MED ORDER — APIXABAN 2.5 MG PO TABS: 2.5000 mg | ORAL_TABLET | Freq: Two times a day (BID) | ORAL | Status: DC

## 2014-08-15 MED ORDER — CIPROFLOXACIN HCL 250 MG PO TABS
500.0000 mg | ORAL_TABLET | Freq: Two times a day (BID) | ORAL | Status: DC
Start: 2014-08-15 — End: 2014-08-19
  Administered 2014-08-15 – 2014-08-19 (×9): 500 mg via ORAL
  Filled 2014-08-15 (×9): qty 2

## 2014-08-15 NOTE — Care Management (Signed)
Patient admitted under observation with chest pain and new atrial flutter.. There is a bump in his troponin .  Patient has EF of 25%.  He has been on Entresto since March 2016.   Patient presents from home and independent in all adls.   Has medicaid. Patient is being started on Eliquis.  This is a preferred medicaid with medicaid.  Provided patient with 30 day coupon.  Denies issues accessing medical care, obtaining medications, maintaining housing, utilities and food.

## 2014-08-15 NOTE — Progress Notes (Signed)
Notified Dr. Jannifer Franklin of BP of 96/74 told to hold Entresto.

## 2014-08-15 NOTE — Consult Note (Addendum)
Cardiology Consultation Note  [name], MRN: [id number], DOB/AGE: [birth year] 49 y.o. Admit date: 08/14/2014   Date of Consult: 08/15/2014 Primary Physician: [name], MD Primary Cardiologist: Dr. Wilhemina Cash, MD  Chief Complaint: Palpitations and chest pain Reason for Consult: New onset atrial flutter and chest pain  HPI: 49 y.o. male with h/o alcoholic and ischemic cardiomyopathy dating back to 2009, CKD stage III, history of GIB, pancreatitis, and HLD who presented to [facility] on 6/21 with onset of palpitations and chest pain x at least 2 days.  He has a long-standing history of chronic alcoholism in the past, sober for 3 years currently. 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 Last echo 02/2014 showed an EF of 20-25%, impaired relaxation, elevated left atrial pressure, mild to moderate increased LV cavity size, severely dilated LA size, mildly dilated right atrium, mild to moderate MR, mild to moderate TR. He has a history of poorly controlled hypertension resulting chronic kidney disease with baseline creatinine approximately 1.8 in the past, more recently in the 2.3 range.   Prior admission to [facility] on January 13 chest pain and accelerated hypertension. He was aggressively diuresed and BP managed and BP was managed. His mild troponin elevation was felt to be 2/2 hypertensive urgency and he was scheduled for an outpatient stress test, though he did not follow through with this.   He was last seen at the heart failure clinic on 5/9 and continued on lisinopril. He had not started torsemide at that time and was up 10 pounds from prior weight.   He presented to [facility] on 6/21 with a 2 day history of palpitations and chest pain. He has also noted increased SOB both with exertion, when laying down, and at rest with the associated palpitations. SOB is somewhat improved currently with improved rate. No associated nausea,  vomiting, presyncope, or syncope. Weight has been stable. No lower extremity edema. Upon his arrival to [facility] he was found to be in new onset atrial flutter with RVR with rates in the 150's with some associated chest pain. He received adenosine in the field without any improvement. He also received IV metoprolol in the ER with improvement. He was tehn placed on IV diltizem gtt with improvement of heart rate to the 80s to 90s. Troponin 0.12-->0.12-->0.11-->0.09. K+ 4.0. SCr 2.39. Mg 1.7. TSH 2.326. WBC 9.2. HGB 13.9. CXR cardiomegaly with mild failure. Negative for edema. He developed marked bradycardia in the PM on 6/21 with heart rates into the 30's and his beta blocker was held with improvement in heart rates to the 70's to 80's. His heart rate continue to trend upwards into the low 100's with minimal movement on 6/22. He was started on Eliquis.       Past Medical History  Diagnosis Date  . Hyperlipidemia   . Elevated troponin     Chronically elevated. - level was 0.17-0.10 during recent admission  . Alcoholic cardiomyopathy 123XX123    a) Myoview: 11/'09: EF 28% with globally decreased function. No inducible ischemia, fixed inferoapical defect = Infarct vs.artifact (not commensurate with extent of reduced EF); b) Echo 8/'12: Mild LV dilation, Mod-Severe global hypokinesis - EF 25-35%. Mild-moderate RV dilation no significant bowel lesion mild-moderate MR; c) Echo Jan 2016: EF 20-25%, high LAP, mild-moderate LV dilation, severe L  . Chronic combined systolic and diastolic heart failure, NYHA class 2     a. see above  . Alcohol abuse   .  Essential hypertension   . CKD (chronic kidney disease), stage III   . GI bleed 11/2013  . Myocardial infarct 2004  . Pancreatitis       Most Recent Cardiac Studies: Echo 02/2014:  Summary:  1. Left ventricular ejection fraction, by visual estimation, is 20 to  25%.  2. Elevated mean left atrial pressure.  3. Impaired relaxation pattern of LV diastolic  filling.  4. Mild to moderately increased left ventricular internal cavity size.  5. Severely dilated left atrium.  6. Mildly dilated right atrium.  7. Mild to moderate mitral valve regurgitation.  8. Normal aortic valve, without any evidence of aortic stenosis or  insufficiency.  9. Mild to moderate tricuspid regurgitation. 10. Mildly increased left ventricular posterior wall thickness. 11. Intact interatrial and interventricular septa appear intact, with no  echocardiographic evidence of intracardiac shunting.   Nuclear stress test 2009:  Submax exercise stress. Patient only able to achieve 67% of max predicted HR. No evidence of ischemia on EKG. There was evidence of apical inferior persistent defect which may have been artifact vs scar. No clear evidence of reversible ischemia. EF 53% with no clear evidence of WMA. Patient was treated medically. Recommendation to proceed with cardiac cath if symptoms persisted.    Surgical History:  Past Surgical History  Procedure Laterality Date  . Knee surgery Right   . Nm myoview ltd  November 2011    No ischemia or infarction. EF 50-55% ( no improvement from 2009 Myoview EF of 28%     Home Meds: Prior to Admission medications   Medication Sig Start Date End Date Taking? Authorizing Provider  aspirin EC 81 MG tablet Take 81 mg by mouth daily.   Yes Historical Provider, MD  atorvastatin (LIPITOR) 80 MG tablet Take 80 mg by mouth daily.   Yes Historical Provider, MD  carvedilol (COREG) 25 MG tablet Take 25 mg by mouth 2 (two) times daily with a meal.   Yes Historical Provider, MD  furosemide (LASIX) 40 MG tablet Take 40 mg by mouth daily.   Yes Historical Provider, MD  lisinopril (PRINIVIL,ZESTRIL) 40 MG tablet Take 40 mg by mouth daily.   Yes Historical Provider, MD  omeprazole (PRILOSEC) 20 MG capsule Take 20 mg by mouth daily.   Yes Historical Provider, MD  sacubitril-valsartan (ENTRESTO) 24-26 MG Take 1 tablet by mouth 2 (two) times daily.  05/15/14  Yes Alisa Graff, FNP    Inpatient Medications:  . apixaban  2.5 mg Oral BID  . atorvastatin  80 mg Oral Daily  . carvedilol  25 mg Oral BID WC  . furosemide  40 mg Oral Daily  . lisinopril  40 mg Oral Daily  . pantoprazole  40 mg Oral Daily  . sacubitril-valsartan  1 tablet Oral BID      Allergies:  Allergies  Allergen Reactions  . Eggs Or Egg-Derived Products Rash    History   Social History  . Marital Status: Single    Spouse Name: N/A  . Number of Children: N/A  . Years of Education: N/A   Occupational History  . Not on file.   Social History Main Topics  . Smoking status: Former Smoker -- 1.00 packs/day for 12 years    Quit date: 02/23/1998  . Smokeless tobacco: Never Used  . Alcohol Use: No     Comment: 4 heavy alcohol drinker. Quit 2 years ago.  . Drug Use: No  . Sexual Activity: Not on file   Other Topics  Concern  . Not on file   Social History Narrative     Family History  Problem Relation Age of Onset  . Hypertension Mother   . Hyperlipidemia Mother   . Diabetes Mother      Review of Systems: Review of Systems  Constitutional: Positive for malaise/fatigue. Negative for fever, chills, weight loss and diaphoresis.  HENT: Negative for congestion.   Eyes: Negative for blurred vision, discharge and redness.  Respiratory: Positive for shortness of breath. Negative for cough, hemoptysis, sputum production and wheezing.   Cardiovascular: Positive for chest pain, palpitations and orthopnea. Negative for claudication, leg swelling and PND.       Chest pain with associated palpitations, none since   Gastrointestinal: Negative for heartburn, nausea, vomiting, abdominal pain, blood in stool and melena.  Genitourinary: Negative for hematuria.  Musculoskeletal: Negative for myalgias and falls.  Skin: Negative for rash.  Neurological: Positive for weakness. Negative for dizziness, sensory change, speech change, loss of consciousness and headaches.   Endo/Heme/Allergies: Does not bruise/bleed easily.  Psychiatric/Behavioral: Negative for depression and substance abuse. The patient is not nervous/anxious.   All other systems reviewed and are negative.    Labs:  Recent Labs  08/14/14 1217 08/14/14 1526 08/14/14 1845 08/14/14 2327  TROPONINI 0.12* 0.12* 0.11* 0.09*   Lab Results  Component Value Date   WBC 9.2 08/14/2014   HGB 13.9 08/14/2014   HCT 44.0 08/14/2014   MCV 80.3 08/14/2014   PLT 228 08/14/2014     Recent Labs Lab 08/14/14 1217  NA 136  K 4.0  CL 105  CO2 23  BUN 32*  CREATININE 2.39*  CALCIUM 8.4*  PROT 6.9  BILITOT 1.2  ALKPHOS 76  ALT 17  AST 26  GLUCOSE 131*   No results found for: CHOL, HDL, LDLCALC, TRIG No results found for: DDIMER  Radiology/Studies:  Dg Chest Portable 1 View  08/14/2014   CLINICAL DATA:  Chest pain  EXAM: PORTABLE CHEST - 1 VIEW  COMPARISON:  03/07/2014  FINDINGS: Cardiac enlargement. Vascular congestion with fluid overload. Negative for edema. No pleural effusion.  IMPRESSION: Cardiac enlargement with mild heart failure.  Negative for edema.   Electronically Signed   By: Franchot Gallo M.D.   On: 08/14/2014 13:07    EKG: atypical atrial flutter, 150 bpm, rare PVC, left axis deviation, lateral TWI. Follow up EKG showed atrial flutter with variable AV block, 75 bpm, left axis, lateral TWI  Telemery: rate controlled atrial flutter   Weights: Filed Weights   08/14/14 1201 08/14/14 1512 08/15/14 0431  Weight: 263 lb (119.296 kg) 266 lb 4.8 oz (120.793 kg) 269 lb 3.2 oz (122.108 kg)     Physical Exam: Blood pressure 122/81, pulse 68, temperature 97.5 F (36.4 C), temperature source Oral, resp. rate 18, height 5\' 11"  (1.803 m), weight 269 lb 3.2 oz (122.108 kg), SpO2 98 %. Body mass index is 37.56 kg/(m^2). General: Well developed, well nourished, in no acute distress. Head: Normocephalic, atraumatic, sclera non-icteric, no xanthomas, nares are without  discharge.  Neck: Negative for carotid bruits. JVD not elevated. Lungs: Clear bilaterally to auscultation without wheezes, rales, or rhonchi. Breathing is unlabored. Heart: RRR with S1 S2. No murmurs, rubs, or gallops appreciated. Abdomen: Obese, soft, non-tender, non-distended with normoactive bowel sounds. No hepatomegaly. No rebound/guarding. No obvious abdominal masses. Msk:  Strength and tone appear normal for age. Extremities: No clubbing or cyanosis. No edema.  Distal pedal pulses are 2+ and equal bilaterally. Neuro: Alert and oriented  X 3. No facial asymmetry. No focal deficit. Moves all extremities spontaneously. Psych:  Responds to questions appropriately with a normal affect.    Assessment and Plan:  49 y.o. male with h/o alcoholic and ischemic cardiomyopathy dating back to 2009, CKD stage III, history of GIB, pancreatitis, and HLD who presented to [facility] on 6/21 with onset of palpitations and chest pain x at least 2 days.  1. New onset atrial flutter 2:1 conduction: -He is at increased risk of decompensation/degeneration with this rhythm given his cardiomyopathy -Will plan for TEE/DCCV on 6/23 with Dr. Rockey Situ, MD -Eliquis 5 mg bid, consider decreasing to 2.5 bid post DCCV (right now only meets one of three guidelines for dose decreasing, SCr) -Continue Coreg 25 mg bid -Remains poorly rate controlled with rates in the 120's with mild exertion. At rest heart rate is in the low 100's -Attempt to avoid CCB given cardiomyopathy  -Cannot use digoxin given renal function   2. Chronic combined CHF: -Add Entresto, titrate up as tolerated  -Stop lisinopril x 36 hours -Coreg 25 mg bid as above -Does not appear grossly volume overloaded at this time -Continue Lipitor 40 mg daily   3. Alcoholic cardiomyopathy: -Consider outpatient nuclear stress testing  -No alcohol in 3 years   4. HTN: -Controlled -Continue current medications as above  5. HLD: -Continue Lipitor 80 mg daily     SignedChristell Faith, PA-C Pager: [phone] 08/15/2014, 7:52 AM  I have seen, examined and evaluated the patient this AM along with Mr. Purcell Mouton.  After reviewing all the available data and chart,  I agree with his findings, examination as well as impression recommendations.  49 year old gentleman with likely alcoholic cardiomyopathy Saw in clinic roughly 4 months ago in post hospital follow-up. He is now presenting with chest pain and mild heart failure symptoms in the setting of atrial flutter with rapid ventricular rate. His heart rate has slowed and his flutter conduction seemed to be irregular, however the distal appears to be flutter waves in place. He started feeling bad this Sunday, but was worse yesterday when he laid down he was noticing orthopnea and PND and then developed a pressure sensation in his chest when he felt his heart rate going very fast.  Initially he was treated with his home dose of beta blocker and IV diltiazem. His weight is relatively controlled and the low 100s sitting up and eating to 90s while lying down.  He has mild troponin elevation which was seen in the past is probably related to his known cardiomyopathy and tachycardia. I don't think this is ACS mediated. Given the fact he is still tachycardic while sitting up and not even moving, he does not seem to be adequately rate controlled. I think he probably is best served to be in a more sinus rhythm to allow for appropriate atrial contraction.  I think he would benefit from attempted cardioversion. We have started ELIQUIS (increasing dose of 5 mg) with hope for TEE guided cardioversion tomorrow. If cardioversion is unsuccessful, would start amiodarone with an IV load and oral dosing plans to potentially bring him back for cardioversion at a later date.  As per my original plan, I'm okay with him being on Entresto. We have stopped the ACE inhibitor as this would need to do been transitioned off. It would  appear that he would has been at least 36 hours from his last dose.  Overall patient appears to be relatively euvolemic and therefore would not increase his  diuretic dosing.  I would prefer not to use amiodarone, as I think he may benefit from referral to Dr. Virl Axe from electrophysiology to consider potentially atrial flutter ablation. While being evaluated for that it can also be considered for possible defibrillator placement given low EF.      [name], M.D., M.S. Interventional Cardiologist   Pager # [phone]

## 2014-08-15 NOTE — Progress Notes (Signed)
A&O. Independent. Slept well through the night. SR on tele. No complaints. ON room air. VSS>

## 2014-08-15 NOTE — Progress Notes (Signed)
[place] [facility] [place] NAME: Hector [name]    MR#:  [id number]  DATE OF BIRTH:  14-Oct-1965  SUBJECTIVE:  CHIEF COMPLAINT:   Chief Complaint  Patient presents with  . Chest Pain  no chest pain but not feeling good.  REVIEW OF SYSTEMS:  CONSTITUTIONAL: No fever, fatigue or weakness.  EYES: No blurred or double vision.  EARS, NOSE, AND THROAT: No tinnitus or ear pain.  RESPIRATORY: No cough, shortness of breath, wheezing or hemoptysis.  CARDIOVASCULAR: No chest pain, orthopnea, edema.  GASTROINTESTINAL: No nausea, vomiting, diarrhea or abdominal pain.  GENITOURINARY: No dysuria, hematuria.  ENDOCRINE: No polyuria, nocturia,  HEMATOLOGY: No anemia, easy bruising or bleeding SKIN: No rash or lesion. MUSCULOSKELETAL: No joint pain or arthritis.   NEUROLOGIC: No tingling, numbness, weakness.  PSYCHIATRY: No anxiety or depression.   DRUG ALLERGIES:   Allergies  Allergen Reactions  . Eggs Or Egg-Derived Products Rash    VITALS:  Blood pressure 122/81, pulse 68, temperature 97.5 F (36.4 C), temperature source Oral, resp. rate 18, height 5\' 11"  (1.803 m), weight 122.108 kg (269 lb 3.2 oz), SpO2 98 %.  PHYSICAL EXAMINATION:  GENERAL:  49 y.o.-year-old patient lying in the bed with no acute distress.  EYES: Pupils equal, round, reactive to light and accommodation. No scleral icterus. Extraocular muscles intact.  HEENT: Head atraumatic, normocephalic. Oropharynx and nasopharynx clear.  NECK:  Supple, no jugular venous distention. No thyroid enlargement, no tenderness.  LUNGS: Normal breath sounds bilaterally, no wheezing, rales,rhonchi or crepitation. No use of accessory muscles of respiration.  CARDIOVASCULAR: S1, S2 normal. No murmurs, rubs, or gallops.  ABDOMEN: Soft, nontender, nondistended. Bowel sounds present. No organomegaly or mass.  EXTREMITIES: No pedal edema, cyanosis, or clubbing.  NEUROLOGIC: Cranial nerves II  through XII are intact. Muscle strength 5/5 in all extremities. Sensation intact. Gait not checked.  PSYCHIATRIC: The patient is alert and oriented x 3.  SKIN: No obvious rash, lesion, or ulcer.    LABORATORY PANEL:   CBC  Recent Labs Lab 08/14/14 1217  WBC 9.2  HGB 13.9  HCT 44.0  PLT 228   ------------------------------------------------------------------------------------------------------------------  Chemistries   Recent Labs Lab 08/14/14 1217  NA 136  K 4.0  CL 105  CO2 23  GLUCOSE 131*  BUN 32*  CREATININE 2.39*  CALCIUM 8.4*  MG 1.7  AST 26  ALT 17  ALKPHOS 76  BILITOT 1.2   ------------------------------------------------------------------------------------------------------------------  Cardiac Enzymes  Recent Labs Lab 08/14/14 2327  TROPONINI 0.09*   ------------------------------------------------------------------------------------------------------------------  RADIOLOGY:  Dg Chest Portable 1 View  08/14/2014   CLINICAL DATA:  Chest pain  EXAM: PORTABLE CHEST - 1 VIEW  COMPARISON:  03/07/2014  FINDINGS: Cardiac enlargement. Vascular congestion with fluid overload. Negative for edema. No pleural effusion.  IMPRESSION: Cardiac enlargement with mild heart failure.  Negative for edema.   Electronically Signed   By: Franchot Gallo M.D.   On: 08/14/2014 13:07    EKG:   Orders placed or performed during the hospital encounter of 08/14/14  . EKG 12-Lead  . EKG 12-Lead  . EKG 12-Lead  . EKG 12-Lead  . EKG 12-Lead  . EKG 12-Lead  . EKG 12-Lead  . EKG 12-Lead  . EKG 12-Lead  . EKG 12-Lead  . EKG    ASSESSMENT AND PLAN:   #1 atrial flutter with RVR. HR is better but not controlled. continue oral Coreg, aspirin and eliquis 2.5 mg bid for now, TEE tomorrow  and may need to start amiodarone per Dr. Ellyn Hack.  #2 chest pain with elevated troponin-likely not non-ST elevation MI but demand ischemia due to atrial flutter. Continue aspirin, Coreg,  atorvastatin.  #3 history of CHF/systolic EF of 123XX123. continue Lasix, Coreg, Entresto, discontinue lisinopril per Dr. Ellyn Hack.  # 4. CKD stage 3. Stable.   Discussed with Dr. Ellyn Hack, cardiologist.     All the records are reviewed and case discussed with Care Management/Social Workerr. Management plans discussed with the patient, family and they are in agreement.  CODE STATUS: Full code   TOTAL TIME TAKING CARE OF THIS PATIENT: 42 minutes.   POSSIBLE D/C IN 2 DAYS, DEPENDING ON CLINICAL CONDITION.   Demetrios Loll M.D on 08/15/2014 at 11:05 AM  Between 7am to 6pm - Pager - [phone]  After 6pm go to [email or web address] - password [email or web address] [facility]  Office  [phone]  CC: Primary care physician; [name], MD

## 2014-08-15 NOTE — Discharge Instructions (Signed)
Low sodium and low fat diet. °Activity as tolerated. °

## 2014-08-16 ENCOUNTER — Observation Stay: Payer: Medicaid Other | Admitting: Anesthesiology

## 2014-08-16 ENCOUNTER — Encounter: Payer: Self-pay | Admitting: Physician Assistant

## 2014-08-16 ENCOUNTER — Encounter
Admission: EM | Disposition: A | Discharge status: Home / Self Care | Payer: Self-pay | Source: Home / Self Care | Attending: Internal Medicine

## 2014-08-16 ENCOUNTER — Observation Stay
Admit: 2014-08-16 | Discharge: 2014-08-16 | Disposition: A | Discharge status: Home / Self Care | Payer: Medicaid Other | Attending: Cardiovascular Disease | Admitting: Cardiovascular Disease

## 2014-08-16 DIAGNOSIS — R072 Precordial pain: Secondary | ICD-10-CM | POA: Diagnosis present

## 2014-08-16 DIAGNOSIS — I248 Other forms of acute ischemic heart disease: Secondary | ICD-10-CM | POA: Diagnosis present

## 2014-08-16 DIAGNOSIS — I471 Supraventricular tachycardia: Secondary | ICD-10-CM | POA: Diagnosis present

## 2014-08-16 DIAGNOSIS — I340 Nonrheumatic mitral (valve) insufficiency: Secondary | ICD-10-CM | POA: Diagnosis present

## 2014-08-16 DIAGNOSIS — K219 Gastro-esophageal reflux disease without esophagitis: Secondary | ICD-10-CM | POA: Diagnosis present

## 2014-08-16 DIAGNOSIS — Z6836 Body mass index (BMI) 36.0-36.9, adult: Secondary | ICD-10-CM | POA: Diagnosis not present

## 2014-08-16 DIAGNOSIS — Z833 Family history of diabetes mellitus: Secondary | ICD-10-CM | POA: Diagnosis not present

## 2014-08-16 DIAGNOSIS — N183 Chronic kidney disease, stage 3 (moderate): Secondary | ICD-10-CM | POA: Diagnosis present

## 2014-08-16 DIAGNOSIS — E785 Hyperlipidemia, unspecified: Secondary | ICD-10-CM | POA: Diagnosis present

## 2014-08-16 DIAGNOSIS — I5042 Chronic combined systolic (congestive) and diastolic (congestive) heart failure: Secondary | ICD-10-CM | POA: Diagnosis present

## 2014-08-16 DIAGNOSIS — Z87891 Personal history of nicotine dependence: Secondary | ICD-10-CM | POA: Diagnosis not present

## 2014-08-16 DIAGNOSIS — I4891 Unspecified atrial fibrillation: Secondary | ICD-10-CM | POA: Diagnosis present

## 2014-08-16 DIAGNOSIS — I255 Ischemic cardiomyopathy: Secondary | ICD-10-CM | POA: Diagnosis present

## 2014-08-16 DIAGNOSIS — F1021 Alcohol dependence, in remission: Secondary | ICD-10-CM | POA: Diagnosis present

## 2014-08-16 DIAGNOSIS — R0602 Shortness of breath: Secondary | ICD-10-CM | POA: Diagnosis not present

## 2014-08-16 DIAGNOSIS — I480 Paroxysmal atrial fibrillation: Secondary | ICD-10-CM | POA: Diagnosis present

## 2014-08-16 DIAGNOSIS — Z7982 Long term (current) use of aspirin: Secondary | ICD-10-CM | POA: Diagnosis not present

## 2014-08-16 DIAGNOSIS — I426 Alcoholic cardiomyopathy: Secondary | ICD-10-CM | POA: Diagnosis present

## 2014-08-16 DIAGNOSIS — I4892 Unspecified atrial flutter: Secondary | ICD-10-CM | POA: Diagnosis present

## 2014-08-16 DIAGNOSIS — Z8249 Family history of ischemic heart disease and other diseases of the circulatory system: Secondary | ICD-10-CM | POA: Diagnosis not present

## 2014-08-16 DIAGNOSIS — I129 Hypertensive chronic kidney disease with stage 1 through stage 4 chronic kidney disease, or unspecified chronic kidney disease: Secondary | ICD-10-CM | POA: Diagnosis present

## 2014-08-16 HISTORY — PX: ELECTROPHYSIOLOGIC STUDY: SHX172A

## 2014-08-16 HISTORY — PX: TEE WITHOUT CARDIOVERSION: SHX5443

## 2014-08-16 LAB — URINE CULTURE: Culture: NO GROWTH

## 2014-08-16 SURGERY — ECHOCARDIOGRAM, TRANSESOPHAGEAL
Anesthesia: General

## 2014-08-16 MED ORDER — SODIUM CHLORIDE 0.9 % IJ SOLN: 3.0000 mL | INTRAMUSCULAR | Status: DC | PRN

## 2014-08-16 MED ORDER — AMIODARONE LOAD VIA INFUSION
150.0000 mg | Freq: Once | INTRAVENOUS | Status: AC
  Administered 2014-08-16: 150 mg via INTRAVENOUS
  Filled 2014-08-16: qty 83.34

## 2014-08-16 MED ORDER — LIDOCAINE VISCOUS 2 % MT SOLN
OROMUCOSAL | Status: AC
  Filled 2014-08-16: qty 15

## 2014-08-16 MED ORDER — SODIUM CHLORIDE 0.9 % IV SOLN: 250.0000 mL | INTRAVENOUS | Status: DC

## 2014-08-16 MED ORDER — PROPOFOL 10 MG/ML IV BOLUS
INTRAVENOUS | Status: DC | PRN
  Administered 2014-08-16: 20 mg via INTRAVENOUS
  Administered 2014-08-16 (×2): 10 mg via INTRAVENOUS
  Administered 2014-08-16: 50 mg via INTRAVENOUS
  Administered 2014-08-16: 30 mg via INTRAVENOUS

## 2014-08-16 MED ORDER — AMIODARONE HCL IN DEXTROSE 360-4.14 MG/200ML-% IV SOLN
60.0000 mg/h | INTRAVENOUS | Status: AC
  Administered 2014-08-16: 60 mg/h via INTRAVENOUS
  Filled 2014-08-16 (×2): qty 200

## 2014-08-16 MED ORDER — SODIUM CHLORIDE 0.9 % IJ SOLN
3.0000 mL | Freq: Two times a day (BID) | INTRAMUSCULAR | Status: DC
  Administered 2014-08-16 – 2014-08-18 (×3): 3 mL via INTRAVENOUS

## 2014-08-16 MED ORDER — AMIODARONE HCL IN DEXTROSE 360-4.14 MG/200ML-% IV SOLN
30.0000 mg/h | INTRAVENOUS | Status: DC
  Administered 2014-08-16 – 2014-08-18 (×4): 30 mg/h via INTRAVENOUS
  Filled 2014-08-16 (×9): qty 200

## 2014-08-16 NOTE — Care Management (Signed)
Patient converted back to A fib after TEE.  Placed on Amiodarone drip.  Attending requests inpatient admission

## 2014-08-16 NOTE — Progress Notes (Signed)
Patient: DASHIEL WESSMAN / Admit Date: 08/14/2014 / Date of Encounter: 08/16/2014, 10:14 AM   Subjective: No complaints, some SOB prior to cardioversion S/p TEE and cardioversion  Review of Systems: Review of Systems  Constitutional: Negative.   Respiratory: Positive for shortness of breath.   Cardiovascular: Negative.   Gastrointestinal: Negative.   Musculoskeletal: Negative.   Neurological: Negative.   Psychiatric/Behavioral: Negative.    All other systems reviewed and negative.   Objective: Telemetry: atrial flutter, s/p cardioversion, now NSR Physical Exam: Blood pressure 112/97, pulse 38, temperature 97.7 F (36.5 C), temperature source Oral, resp. rate 24, height 5\' 11"  (1.803 m), weight 256 lb 9.6 oz (116.393 kg), SpO2 96 %. Body mass index is 35.8 kg/(m^2). General: Well developed, well nourished, in no acute distress. Head: Normocephalic, atraumatic, sclera non-icteric, no xanthomas, nares are without discharge. Neck: Negative for carotid bruits. JVP not elevated. Lungs: Clear bilaterally to auscultation without wheezes, rales, or rhonchi. Breathing is unlabored. Heart: RRR S1 S2 without murmurs, rubs, or gallops.  Abdomen: Soft, non-tender, non-distended with normoactive bowel sounds. No rebound/guarding. Extremities: No clubbing or cyanosis. No edema. Distal pedal pulses are 2+ and equal bilaterally. Neuro: Alert and oriented X 3. Moves all extremities spontaneously. Psych:  Responds to questions appropriately with a normal affect.   Intake/Output Summary (Last 24 hours) at 08/16/14 1014 Last data filed at 08/16/14 0842  Gross per 24 hour  Intake    240 ml  Output   2150 ml  Net  -1910 ml    Inpatient Medications:  . [MAR Hold] apixaban  5 mg Oral BID  . [MAR Hold] atorvastatin  80 mg Oral Daily  . [MAR Hold] carvedilol  25 mg Oral BID WC  . [MAR Hold] ciprofloxacin  500 mg Oral BID  . [MAR Hold] furosemide  40 mg Oral Daily  . lidocaine      . [MAR  Hold] pantoprazole  40 mg Oral Daily  . [MAR Hold] sacubitril-valsartan  1 tablet Oral BID  . sodium chloride  3 mL Intravenous Q12H  . [MAR Hold] sodium chloride  3 mL Intravenous Q12H   Infusions:  . sodium chloride    . sodium chloride    . sodium chloride      Labs:  Recent Labs  08/14/14 1217  NA 136  K 4.0  CL 105  CO2 23  GLUCOSE 131*  BUN 32*  CREATININE 2.39*  CALCIUM 8.4*  MG 1.7  PHOS 4.0    Recent Labs  08/14/14 1217  AST 26  ALT 17  ALKPHOS 76  BILITOT 1.2  PROT 6.9  ALBUMIN 3.6    Recent Labs  08/14/14 1217  WBC 9.2  NEUTROABS 6.5  HGB 13.9  HCT 44.0  MCV 80.3  PLT 228    Recent Labs  08/14/14 1217 08/14/14 1526 08/14/14 1845 08/14/14 2327  TROPONINI 0.12* 0.12* 0.11* 0.09*   Invalid input(s): POCBNP No results for input(s): HGBA1C in the last 72 hours.   Weights: Filed Weights   08/14/14 1512 08/15/14 0431 08/16/14 0515  Weight: 266 lb 4.8 oz (120.793 kg) 269 lb 3.2 oz (122.108 kg) 256 lb 9.6 oz (116.393 kg)     Radiology/Studies:  Dg Chest Portable 1 View  08/14/2014   CLINICAL DATA:  Chest pain  EXAM: PORTABLE CHEST - 1 VIEW  COMPARISON:  03/07/2014  FINDINGS: Cardiac enlargement. Vascular congestion with fluid overload. Negative for edema. No pleural effusion.  IMPRESSION: Cardiac enlargement with mild heart  failure.  Negative for edema.   Electronically Signed   By: Franchot Gallo M.D.   On: 08/14/2014 13:07     Assessment and Plan  49 y.o. male  49 y.o. male with h/o alcoholic and ischemic cardiomyopathy dating back to 2009, CKD stage III, history of GIB, pancreatitis, and HLD who presented to [facility] on 6/21 with onset of palpitations and chest pain x at least 2 days.  1. New onset atrial flutter 2:1 conduction: S/p TEE and cardioversion this AM, successful in restoring NSR Would continue Eliquis 5 mg bid -Continue Coreg 25 mg bid Needs follow up with Dr. Caryl Comes, EP for consideration for atrial futter ablation and  ICD  2. Chronic combined CHF: -Add Entresto, titrate up as tolerated  -Stop lisinopril x 36 hours -Coreg 25 mg bid as above -Continue Lipitor 40 mg daily  Continue lasix daily  3. Alcoholic cardiomyopathy: -Consider outpatient nuclear stress testing  -No alcohol in 3 years   4. HTN: -Controlled -Continue current medications as above  5. HLD: -Continue Lipitor    Signed, Esmond Plants  MD 08/16/2014, 10:14 AM

## 2014-08-16 NOTE — Progress Notes (Signed)
*  PRELIMINARY RESULTS* Echocardiogram Echocardiogram Transesophageal has been performed.  Lavell Luster Stills 08/16/2014, 10:20 AM

## 2014-08-16 NOTE — Anesthesia Postprocedure Evaluation (Signed)
  Anesthesia Post-op Note  Patient: Hector [name]  Procedure(s) Performed: Procedure(s): TRANSESOPHAGEAL ECHOCARDIOGRAM (TEE) (N/A) CARDIOVERSION (N/A)  Anesthesia type:General  Patient location: PACU; specials  Post pain: Pain level controlled  Post assessment: Post-op Vital signs reviewed, Patient's Cardiovascular Status Stable, Respiratory Function Stable, Patent Airway and No signs of Nausea or vomiting  Post vital signs: Reviewed and stable  Last Vitals:  Filed Vitals:   08/16/14 1015  BP: 101/81  Pulse: 108  Temp: 37 C  Resp: 24    Level of consciousness: awake, alert  and patient cooperative  Complications: No apparent anesthesia complications

## 2014-08-16 NOTE — Progress Notes (Signed)
A&O and independent. Rested quietly through the night. NPO for procedure, TEE in the AM.

## 2014-08-16 NOTE — Transfer of Care (Signed)
Immediate Anesthesia Transfer of Care Note  Patient: Hector [name]  Procedure(s) Performed: Procedure(s): TRANSESOPHAGEAL ECHOCARDIOGRAM (TEE) (N/A) CARDIOVERSION (N/A)  Patient Location: PACU and Cath Lab  Anesthesia Type:General  Level of Consciousness: awake, alert  and oriented  Airway & Oxygen Therapy: Patient Spontanous Breathing and Patient connected to nasal cannula oxygen  Post-op Assessment: Report given to RN and Post -op Vital signs reviewed and stable  Post vital signs: Reviewed and stable  Last Vitals:  Filed Vitals:   08/16/14 1015  BP: 101/81  Pulse: 108  Temp: 37 C  Resp: 24    Complications: No apparent anesthesia complications

## 2014-08-16 NOTE — Progress Notes (Signed)
[facility] at [place] NAME: Hector [name]    MR#:  [id number]  DATE OF BIRTH:  11/26/1965  SUBJECTIVE:  CHIEF COMPLAINT:   Chief Complaint  Patient presents with  . Chest Pain  no complaint.  REVIEW OF SYSTEMS:  CONSTITUTIONAL: No fever, fatigue or weakness.  EYES: No blurred or double vision.  EARS, NOSE, AND THROAT: No tinnitus or ear pain.  RESPIRATORY: No cough, shortness of breath, wheezing or hemoptysis.  CARDIOVASCULAR: No chest pain, orthopnea, edema.  GASTROINTESTINAL: No nausea, vomiting, diarrhea or abdominal pain.  GENITOURINARY: No dysuria, hematuria.  ENDOCRINE: No polyuria, nocturia,  HEMATOLOGY: No anemia, easy bruising or bleeding SKIN: No rash or lesion. MUSCULOSKELETAL: No joint pain or arthritis.   NEUROLOGIC: No tingling, numbness, weakness.  PSYCHIATRY: No anxiety or depression.   DRUG ALLERGIES:   Allergies  Allergen Reactions  . Eggs Or Egg-Derived Products Rash    VITALS:  Blood pressure 126/102, pulse 38, temperature 98.6 F (37 C), temperature source Tympanic, resp. rate 24, height 5\' 11"  (1.803 m), weight 116.393 kg (256 lb 9.6 oz), SpO2 100 %.  PHYSICAL EXAMINATION:  GENERAL:  49 y.o.-year-old patient lying in the bed with no acute distress.  EYES: Pupils equal, round, reactive to light and accommodation. No scleral icterus. Extraocular muscles intact.  HEENT: Head atraumatic, normocephalic. Oropharynx and nasopharynx clear.  NECK:  Supple, no jugular venous distention. No thyroid enlargement, no tenderness.  LUNGS: Normal breath sounds bilaterally, no wheezing, rales,rhonchi or crepitation. No use of accessory muscles of respiration.  CARDIOVASCULAR: S1, S2 normal. No murmurs, rubs, or gallops.  ABDOMEN: Soft, nontender, nondistended. Bowel sounds present. No organomegaly or mass.  EXTREMITIES: No pedal edema, cyanosis, or clubbing.  NEUROLOGIC: Cranial nerves II through XII are intact.  Muscle strength 5/5 in all extremities. Sensation intact. Gait not checked.  PSYCHIATRIC: The patient is alert and oriented x 3.  SKIN: No obvious rash, lesion, or ulcer.    LABORATORY PANEL:   CBC  Recent Labs Lab 08/14/14 1217  WBC 9.2  HGB 13.9  HCT 44.0  PLT 228   ------------------------------------------------------------------------------------------------------------------  Chemistries   Recent Labs Lab 08/14/14 1217  NA 136  K 4.0  CL 105  CO2 23  GLUCOSE 131*  BUN 32*  CREATININE 2.39*  CALCIUM 8.4*  MG 1.7  AST 26  ALT 17  ALKPHOS 76  BILITOT 1.2   ------------------------------------------------------------------------------------------------------------------  Cardiac Enzymes  Recent Labs Lab 08/14/14 2327  TROPONINI 0.09*   ------------------------------------------------------------------------------------------------------------------  RADIOLOGY:  No results found.  EKG:   Orders placed or performed during the hospital encounter of 08/14/14  . EKG 12-Lead  . EKG 12-Lead  . EKG 12-Lead  . EKG 12-Lead  . EKG 12-Lead  . EKG 12-Lead  . EKG 12-Lead  . EKG 12-Lead  . EKG 12-Lead  . EKG 12-Lead  . EKG  . EKG 12-Lead  . EKG 12-Lead pre-cardioversion  . EKG 12-Lead  . EKG 12-Lead pre-cardioversion    ASSESSMENT AND PLAN:   #1 atrial flutter with RVR. HR was about 100-110.  S/p cardioversion and TEE. Converted to NSR after procedure but back to Afib with RVR at 130's, started on amiodarone drip. continue oral Coreg, aspirin and eliquis 5 mg bid per Dr. Rockey Situ.  #2 chest pain with elevated troponin-likely not non-ST elevation MI but demand ischemia due to atrial flutter. Continue aspirin, Coreg, atorvastatin.  #3 history of CHF/systolic EF of 123XX123. continue Lasix, Coreg, Entresto, discontinued  lisinopril per Dr. Ellyn Hack.  # 4. CKD stage 3. Stable.   Discussed with Dr. Ellyn Hack, cardiologist.     All the records are  reviewed and case discussed with Care Management/Social Workerr. Management plans discussed with the patient, family and they are in agreement.  CODE STATUS: Full code   TOTAL TIME TAKING CARE OF THIS PATIENT: 38 minutes.   POSSIBLE D/C IN 2 DAYS, DEPENDING ON CLINICAL CONDITION.   Demetrios Loll M.D on 08/16/2014 at 1:41 PM  Between 7am to 6pm - Pager - [phone]  After 6pm go to [email or web address] - password [email or web address] [facility]  Office  [phone]  CC: Primary care physician; [name], MD

## 2014-08-16 NOTE — Progress Notes (Signed)
Converted back to atrial fib/flutter one hour after procedure Will start amiodarone infusion in attempt to convert back to NSR

## 2014-08-16 NOTE — Anesthesia Preprocedure Evaluation (Addendum)
Anesthesia Evaluation  Patient identified by MRN, date of birth, ID band Patient awake    Reviewed: Allergy & Precautions, H&P , NPO status , Patient's Chart, lab work & pertinent test results, reviewed documented beta blocker date and time   Airway Mallampati: III       Dental  (+) Edentulous Upper   Pulmonary COPDformer smoker,    + decreased breath sounds      Cardiovascular hypertension, On Home Beta Blockers and Pt. on medications + CAD, + Past MI and +CHF Atrial Fibrillation IIRhythm:Irregular     Neuro/Psych negative neurological ROS  negative psych ROS   GI/Hepatic negative GI ROS, Neg liver ROS,   Endo/Other  Morbid obesity  Renal/GU   negative genitourinary   Musculoskeletal negative musculoskeletal ROS (+)   Abdominal (+) + obese,   Peds  Hematology   Anesthesia Other Findings   Reproductive/Obstetrics negative OB ROS                            Anesthesia Physical Anesthesia Plan  ASA: IV  Anesthesia Plan: General   Post-op Pain Management:    Induction: Intravenous  Airway Management Planned: Nasal Cannula  Additional Equipment:   Intra-op Plan:   Post-operative Plan: Extubation in OR  Informed Consent: I have reviewed the patients History and Physical, chart, labs and discussed the procedure including the risks, benefits and alternatives for the proposed anesthesia with the patient or authorized representative who has indicated his/her understanding and acceptance.     Plan Discussed with: CRNA  Anesthesia Plan Comments:         Anesthesia Quick Evaluation

## 2014-08-16 NOTE — CV Procedure (Signed)
Cardioversion procedure note For Paroxysmal atrial flutter  Procedure Details: TEE performed prior to the cardioversion. No LA atrial or LA appendage thrombus noted.  Consent: Risks of procedure as well as the alternatives and risks of each were explained to the (patient/caregiver). Consent for procedure obtained.  Time Out: Verified patient identification, verified procedure, site/side was marked, verified correct patient position, special equipment/implants available, medications/allergies/relevent history reviewed, required imaging and test results available. Performed  Patient placed on cardiac monitor, pulse oximetry, supplemental oxygen as necessary.  Sedation given: propofol IV, Dr.  Gomez Cleverly pads placed anterior and posterior chest.   Cardioverted 1 time(s).  Cardioverted at 150  J. Synchronized biphasic Converted to NSR   Evaluation: Findings: Post procedure EKG shows: NSR Complications: None Patient did tolerate procedure well.  Time Spent Directly with the Patient:  60 minutes   Esmond Plants, M.D., Ph.D.

## 2014-08-17 NOTE — Progress Notes (Signed)
[facility] at [place] NAME: Hector [name]    MR#:  [id number]  DATE OF BIRTH:  September 21, 1965  SUBJECTIVE:  CHIEF COMPLAINT:   Chief Complaint  Patient presents with  . Chest Pain  no complaint.  REVIEW OF SYSTEMS:  CONSTITUTIONAL: No fever, fatigue or weakness.  EYES: No blurred or double vision.  EARS, NOSE, AND THROAT: No tinnitus or ear pain.  RESPIRATORY: No cough, shortness of breath, wheezing or hemoptysis.  CARDIOVASCULAR: No chest pain, orthopnea, edema.  GASTROINTESTINAL: No nausea, vomiting, diarrhea or abdominal pain.  GENITOURINARY: No dysuria, hematuria.  ENDOCRINE: No polyuria, nocturia,  HEMATOLOGY: No anemia, easy bruising or bleeding SKIN: No rash or lesion. MUSCULOSKELETAL: No joint pain or arthritis.   NEUROLOGIC: No tingling, numbness, weakness.  PSYCHIATRY: No anxiety or depression.   DRUG ALLERGIES:   Allergies  Allergen Reactions  . Eggs Or Egg-Derived Products Rash    VITALS:  Blood pressure 98/76, pulse 62, temperature 97.5 F (36.4 C), temperature source Oral, resp. rate 18, height 5\' 11"  (1.803 m), weight 117.164 kg (258 lb 4.8 oz), SpO2 97 %.  PHYSICAL EXAMINATION:  GENERAL:  49 y.o.-year-old patient lying in the bed with no acute distress.  EYES: Pupils equal, round, reactive to light and accommodation. No scleral icterus. Extraocular muscles intact.  HEENT: Head atraumatic, normocephalic. Oropharynx and nasopharynx clear.  NECK:  Supple, no jugular venous distention. No thyroid enlargement, no tenderness.  LUNGS: Normal breath sounds bilaterally, no wheezing, rales,rhonchi or crepitation. No use of accessory muscles of respiration.  CARDIOVASCULAR: S1, S2 normal. No murmurs, rubs, or gallops.  ABDOMEN: Soft, nontender, nondistended. Bowel sounds present. No organomegaly or mass.  EXTREMITIES: No pedal edema, cyanosis, or clubbing.  NEUROLOGIC: Cranial nerves II through XII are intact.  Muscle strength 5/5 in all extremities. Sensation intact. Gait not checked.  PSYCHIATRIC: The patient is alert and oriented x 3.  SKIN: No obvious rash, lesion, or ulcer.    LABORATORY PANEL:   CBC  Recent Labs Lab 08/14/14 1217  WBC 9.2  HGB 13.9  HCT 44.0  PLT 228   ------------------------------------------------------------------------------------------------------------------  Chemistries   Recent Labs Lab 08/14/14 1217  NA 136  K 4.0  CL 105  CO2 23  GLUCOSE 131*  BUN 32*  CREATININE 2.39*  CALCIUM 8.4*  MG 1.7  AST 26  ALT 17  ALKPHOS 76  BILITOT 1.2   ------------------------------------------------------------------------------------------------------------------  Cardiac Enzymes  Recent Labs Lab 08/14/14 2327  TROPONINI 0.09*   ------------------------------------------------------------------------------------------------------------------  RADIOLOGY:  No results found.  EKG:   Orders placed or performed during the hospital encounter of 08/14/14  . EKG 12-Lead  . EKG 12-Lead  . EKG 12-Lead  . EKG 12-Lead  . EKG 12-Lead  . EKG 12-Lead  . EKG 12-Lead  . EKG 12-Lead  . EKG 12-Lead  . EKG 12-Lead  . EKG  . EKG 12-Lead  . EKG 12-Lead pre-cardioversion  . EKG 12-Lead  . EKG 12-Lead pre-cardioversion    ASSESSMENT AND PLAN:   #1 atrial flutter with RVR. HR was about 80-100.  S/p cardioversion and TEE. Converted to NSR after procedure but back to Afib with RVR,  on amiodarone drip. HR is better controlled, Continue amiodarone infusion with goal to transition to po later today if he tolerates ambulation well.  continue oral Coreg, aspirin and eliquis 5 mg bid per cardiologist.   #2 chest pain with elevated troponin-likely not non-ST elevation MI but demand ischemia due to  atrial flutter. Continue aspirin, Coreg, atorvastatin.  #3 history of CHF/systolic EF of 123XX123 and Alcoholic cardiomyopathy continue Lasix, Coreg, Entresto,  discontinued lisinopril per Dr. Ellyn Hack.  # 4. CKD stage 3. Stable.   Discussed with cardiologist PA.     All the records are reviewed and case discussed with Care Management/Social Workerr. Management plans discussed with the patient, family and they are in agreement.  CODE STATUS: Full code   TOTAL TIME TAKING CARE OF THIS PATIENT: 38 minutes.   POSSIBLE D/C IN 2 DAYS, DEPENDING ON CLINICAL CONDITION.   Demetrios Loll M.D on 08/17/2014 at 1:52 PM  Between 7am to 6pm - Pager - [phone]  After 6pm go to [email or web address] - password [email or web address] [facility]  Office  [phone]  CC: Primary care physician; [name], MD

## 2014-08-17 NOTE — Plan of Care (Signed)
Problem: Phase I Progression Outcomes Goal: Hemodynamically stable Outcome: Progressing Pt denies any chest pain. SBP 110-130s/70-80s, HR Afib/flutter 70-90s, Pt on RA SpO2 >90%. Pt remains on Amiodarone gtt.

## 2014-08-17 NOTE — Progress Notes (Signed)
Patient: Hector [name] / Admit Date: 08/14/2014 / Date of Encounter: 08/17/2014, 8:28 AM   Subjective: No complaints. Still with some orthopnea. S/p successful TEE/DCCV on 6/23 - converted to A-fib/flutter one hour post DCCV, was placed on amiodarone infusion. Now with atrial flutter with variable AV conduction. Has ambulated in room with no SOB.   TEE showed EF 20-25%, diffuse HK, not technically sufficient to allow evaluation of LV diastolic function. No evidence of thrombus. Mild MR. No evidence of thrombus in the atrial cavity or appendage. The appendage was moderately to severely dilated. Emptying velocity was normal. RV cavity size was normal. RA was mildly dilated. No patent foramen ovale was identified.   Review of Systems: Review of Systems  Constitutional: Negative for fever, chills, weight loss, malaise/fatigue and diaphoresis.  HENT: Negative for congestion.   Eyes: Negative for blurred vision, discharge and redness.  Respiratory: Positive for shortness of breath. Negative for cough, hemoptysis, sputum production and wheezing.        SOB when laying supine  Cardiovascular: Positive for orthopnea. Negative for chest pain, palpitations, claudication, leg swelling and PND.  Gastrointestinal: Negative for heartburn, nausea, vomiting, abdominal pain, blood in stool and melena.  Genitourinary: Negative for hematuria.  Musculoskeletal: Negative for myalgias and falls.  Skin: Negative for rash.  Neurological: Negative for dizziness, tingling, tremors, weakness and headaches.  Endo/Heme/Allergies: Does not bruise/bleed easily.  Psychiatric/Behavioral: The patient is not nervous/anxious.   All other systems reviewed and are negative.    Objective: Telemetry: artrial flutter with variable AV conduction  Physical Exam: Blood pressure 122/86, pulse 44, temperature 97.7 F (36.5 C), temperature source Oral, resp. rate 17, height 5\' 11"  (1.803 m), weight 258 lb 4.8 oz (117.164  kg), SpO2 98 %. Body mass index is 36.04 kg/(m^2). General: Well developed, well nourished, in no acute distress. Head: Normocephalic, atraumatic, sclera non-icteric, no xanthomas, nares are without discharge. Neck: Negative for carotid bruits. JVP not elevated. Lungs: Clear bilaterally to auscultation without wheezes, rales, or rhonchi. Breathing is unlabored. Heart: Irregular, S1 S2 without murmurs, rubs, or gallops.  Abdomen: Soft, non-tender, non-distended with normoactive bowel sounds. No rebound/guarding. Extremities: No clubbing or cyanosis. No edema. Distal pedal pulses are 2+ and equal bilaterally. Neuro: Alert and oriented X 3. Moves all extremities spontaneously. Psych:  Responds to questions appropriately with a normal affect.   Intake/Output Summary (Last 24 hours) at 08/17/14 0828 Last data filed at 08/17/14 0734  Gross per 24 hour  Intake 1003.2 ml  Output   1775 ml  Net -771.8 ml    Inpatient Medications:  . apixaban  5 mg Oral BID  . atorvastatin  80 mg Oral Daily  . carvedilol  25 mg Oral BID WC  . ciprofloxacin  500 mg Oral BID  . furosemide  40 mg Oral Daily  . pantoprazole  40 mg Oral Daily  . sacubitril-valsartan  1 tablet Oral BID  . sodium chloride  3 mL Intravenous Q12H   Infusions:  . sodium chloride    . sodium chloride    . amiodarone 30 mg/hr (08/17/14 0056)    Labs:  Recent Labs  08/14/14 1217  NA 136  K 4.0  CL 105  CO2 23  GLUCOSE 131*  BUN 32*  CREATININE 2.39*  CALCIUM 8.4*  MG 1.7  PHOS 4.0    Recent Labs  08/14/14 1217  AST 26  ALT 17  ALKPHOS 76  BILITOT 1.2  PROT 6.9  ALBUMIN 3.6  Recent Labs  08/14/14 1217  WBC 9.2  NEUTROABS 6.5  HGB 13.9  HCT 44.0  MCV 80.3  PLT 228    Recent Labs  08/14/14 1217 08/14/14 1526 08/14/14 1845 08/14/14 2327  TROPONINI 0.12* 0.12* 0.11* 0.09*   Invalid input(s): POCBNP No results for input(s): HGBA1C in the last 72 hours.   Weights: Filed Weights   08/15/14  0431 08/16/14 0515 08/17/14 0602  Weight: 269 lb 3.2 oz (122.108 kg) 256 lb 9.6 oz (116.393 kg) 258 lb 4.8 oz (117.164 kg)     Radiology/Studies:  Dg Chest Portable 1 View  08/14/2014   CLINICAL DATA:  Chest pain  EXAM: PORTABLE CHEST - 1 VIEW  COMPARISON:  03/07/2014  FINDINGS: Cardiac enlargement. Vascular congestion with fluid overload. Negative for edema. No pleural effusion.  IMPRESSION: Cardiac enlargement with mild heart failure.  Negative for edema.   Electronically Signed   By: Franchot Gallo M.D.   On: 08/14/2014 13:07     Assessment and Plan  49 y.o. male with h/o alcoholic and ischemic cardiomyopathy dating back to 2009, CKD stage III, history of GIB, pancreatitis, and HLD who presented to [facility] on 6/21 with onset of palpitations and chest pain x at least 2 days.  1. New onset atrial flutter 2:1 conduction: S/p TEE and cardioversion this AM, successful in restoring NSR, unfortunately he redeveloped Afib/flutter one hour post TEE/DCCV and was placed on amiodarone infusion -He remains in atrial flutter with variable AV conduction this morning  -Heart rate in the 80's to 100 while sitting up eating breakfast  -Continue amiodarone infusion with goal to transition to po later today if he tolerates ambulation well  -Ambulate patient in the hall today to assess chronotropic response  -If needed heart rate is uncontrolled with ambulation he may require transfer to [facility] for EP evaluation and consideration for atrial futter ablation and ICD given his cardiomyopathy   -Would continue Eliquis 5 mg bid -Continue Coreg 25 mg bid -Otherwise if heart rate is stable he could likely follow up with Dr. [name], EP for the above    2. Chronic combined CHF: -Continue Entresto, titrate up as tolerated  -Stop lisinopril -Coreg 25 mg bid as above -Continue Lipitor 40 mg daily  -Continue lasix daily  3. Alcoholic cardiomyopathy: -Consider outpatient nuclear stress testing  -No alcohol  in 3 years   4. HTN: -Controlled -Continue current medications as above  5. HLD: -Continue Lipitor   Signed, Christell Faith, PA-C Pager: [phone] 08/17/2014, 8:28 AM

## 2014-08-18 LAB — CREATININE, SERUM
Creatinine, Ser: 2.07 mg/dL — ABNORMAL HIGH (ref 0.61–1.24)
GFR calc Af Amer: 42 mL/min — ABNORMAL LOW (ref >60)
GFR calc non Af Amer: 36 mL/min — ABNORMAL LOW (ref >60)

## 2014-08-18 MED ORDER — AMIODARONE HCL 200 MG PO TABS
400.0000 mg | ORAL_TABLET | Freq: Two times a day (BID) | ORAL | Status: DC
  Administered 2014-08-18 – 2014-08-19 (×3): 400 mg via ORAL
  Filled 2014-08-18 (×3): qty 2

## 2014-08-18 NOTE — Progress Notes (Signed)
[facility] at [place] NAME: Hector [name]    MR#:  [id number]  DATE OF BIRTH:  Apr 22, 1965  SUBJECTIVE:  CHIEF COMPLAINT:   Chief Complaint  Patient presents with  . Chest Pain  no complaint.  REVIEW OF SYSTEMS:  CONSTITUTIONAL: No fever, fatigue or weakness.  EYES: No blurred or double vision.  EARS, NOSE, AND THROAT: No tinnitus or ear pain.  RESPIRATORY: No cough, shortness of breath, wheezing or hemoptysis.  CARDIOVASCULAR: No chest pain, orthopnea, edema.  GASTROINTESTINAL: No nausea, vomiting, diarrhea or abdominal pain.  GENITOURINARY: No dysuria, hematuria.  ENDOCRINE: No polyuria, nocturia,  HEMATOLOGY: No anemia, easy bruising or bleeding SKIN: No rash or lesion. MUSCULOSKELETAL: No joint pain or arthritis.   NEUROLOGIC: No tingling, numbness, weakness.  PSYCHIATRY: No anxiety or depression.   DRUG ALLERGIES:   Allergies  Allergen Reactions  . Eggs Or Egg-Derived Products Rash    VITALS:  Blood pressure 101/71, pulse 60, temperature 98 F (36.7 C), temperature source Oral, resp. rate 17, height 5\' 11"  (1.803 m), weight 117.164 kg (258 lb 4.8 oz), SpO2 98 %.  PHYSICAL EXAMINATION:  GENERAL:  49 y.o.-year-old patient lying in the bed with no acute distress.  EYES: Pupils equal, round, reactive to light and accommodation. No scleral icterus. Extraocular muscles intact.  HEENT: Head atraumatic, normocephalic. Oropharynx and nasopharynx clear.  NECK:  Supple, no jugular venous distention. No thyroid enlargement, no tenderness.  LUNGS: Normal breath sounds bilaterally, no wheezing, rales,rhonchi or crepitation. No use of accessory muscles of respiration.  CARDIOVASCULAR: S1, S2 normal. No murmurs, rubs, or gallops.  ABDOMEN: Soft, nontender, nondistended. Bowel sounds present. No organomegaly or mass.  EXTREMITIES: No pedal edema, cyanosis, or clubbing.  NEUROLOGIC: Cranial nerves II through XII are intact.  Muscle strength 5/5 in all extremities. Sensation intact. Gait not checked.  PSYCHIATRIC: The patient is alert and oriented x 3.  SKIN: No obvious rash, lesion, or ulcer.    LABORATORY PANEL:   CBC  Recent Labs Lab 08/14/14 1217  WBC 9.2  HGB 13.9  HCT 44.0  PLT 228   ------------------------------------------------------------------------------------------------------------------  Chemistries   Recent Labs Lab 08/14/14 1217 08/18/14 0516  NA 136  --   K 4.0  --   CL 105  --   CO2 23  --   GLUCOSE 131*  --   BUN 32*  --   CREATININE 2.39* 2.07*  CALCIUM 8.4*  --   MG 1.7  --   AST 26  --   ALT 17  --   ALKPHOS 76  --   BILITOT 1.2  --    ------------------------------------------------------------------------------------------------------------------  Cardiac Enzymes  Recent Labs Lab 08/14/14 2327  TROPONINI 0.09*   ------------------------------------------------------------------------------------------------------------------  RADIOLOGY:  No results found.  EKG:   Orders placed or performed during the hospital encounter of 08/14/14  . EKG 12-Lead  . EKG 12-Lead  . EKG 12-Lead  . EKG 12-Lead  . EKG 12-Lead  . EKG 12-Lead  . EKG 12-Lead  . EKG 12-Lead  . EKG 12-Lead  . EKG 12-Lead  . EKG  . EKG 12-Lead  . EKG 12-Lead pre-cardioversion  . EKG 12-Lead  . EKG 12-Lead pre-cardioversion    ASSESSMENT AND PLAN:   #1 atrial flutter with RVR. Rate controlled on amiodarone drip.  S/p cardioversion and TEE. Converted to NSR after procedure but back to Afib with RVR,  on amiodarone drip. HR is better controlled, discontinue amiodarone infusion and transition  to 400 mg po bid per cardiologist. continue oral Coreg, aspirin and eliquis 5 mg bid per cardiologist.   #2 chest pain with elevated troponin-likely not non-ST elevation MI but demand ischemia due to atrial flutter. Continue aspirin, Coreg, atorvastatin.  #3 history of CHF/systolic EF of  123XX123 and Alcoholic cardiomyopathy continue Lasix, Coreg, Entresto, discontinued lisinopril per Dr. Ellyn Hack.  # 4. CKD stage 3. Stable.   Discussed with cardiologist.     All the records are reviewed and case discussed with Care Management/Social Workerr. Management plans discussed with the patient, family and they are in agreement.  CODE STATUS: Full code   TOTAL TIME TAKING CARE OF THIS PATIENT: 37 minutes.   POSSIBLE D/C tomorrow.  DEPENDING ON CLINICAL CONDITION.   Demetrios Loll M.D on 08/18/2014 at 12:11 PM  Between 7am to 6pm - Pager - [phone]  After 6pm go to [email or web address] - password [email or web address] [facility]  Office  [phone]  CC: Primary care physician; [name], MD

## 2014-08-18 NOTE — Plan of Care (Signed)
Problem: Phase II Progression Outcomes Goal: Hemodynamically stable Outcome: Completed/Met Date Met:  08/18/14

## 2014-08-18 NOTE — Progress Notes (Signed)
     SUBJECTIVE: NO chest pain or SOB.   BP 118/82 mmHg  Pulse 42  Temp(Src) 98.6 F (37 C) (Oral)  Resp 20  Ht 5\' 11"  (1.803 m)  Wt 258 lb 4.8 oz (117.164 kg)  BMI 36.04 kg/m2  SpO2 100%  Intake/Output Summary (Last 24 hours) at 08/18/14 1025 Last data filed at 08/18/14 0834  Gross per 24 hour  Intake    460 ml  Output   1925 ml  Net  -1465 ml    PHYSICAL EXAM General: Well developed, well nourished, in no acute distress. Alert and oriented x 3.  Psych:  Good affect, responds appropriately Neck: No JVD. No masses noted.  Lungs: Clear bilaterally with no wheezes or rhonci noted.  Heart: Irregular irregular with no murmurs noted. Abdomen: Bowel sounds are present. Soft, non-tender.  Extremities: No lower extremity edema.   LABS: Basic Metabolic Panel:  Recent Labs  08/18/14 0516  CREATININE 2.07*    Current Meds: . apixaban  5 mg Oral BID  . atorvastatin  80 mg Oral Daily  . carvedilol  25 mg Oral BID WC  . ciprofloxacin  500 mg Oral BID  . furosemide  40 mg Oral Daily  . pantoprazole  40 mg Oral Daily  . sacubitril-valsartan  1 tablet Oral BID  . sodium chloride  3 mL Intravenous Q12H     ASSESSMENT AND PLAN: 49 y.o. male with h/o alcoholic and non-ischemic cardiomyopathy dating back to 2009, CKD stage III, history of GIB, pancreatitis, and HLD who presented to [facility] on 6/21 with onset of palpitations and chest pain x at least 2 days. TEE guided DCCV on 08/17/14 but converted back to atrial flutter.   1. New onset atrial flutter: He is still in atrial flutter this am but is rate controlled on IV amiodarone. Would change to amiodarone 400 mg po BID this am with plans to change to 200 mg po BID after one week. Continue Eliquis for anti-coagulation. Continue Coreg 25 mg BID. He will need f/u with Dr. [name] with EP after discharge. We will arrange this f/u.   2. Chronic combined CHF: Volume status ok. Continue Entresto, Coreg and Lasix.   3. Alcoholic  cardiomyopathy: Consider outpatient nuclear stress testing   4. HTN: BP controlled. Continue current medications.   5. HLD: Continue Lipitor    MCALHANY,CHRISTOPHER  6/25/201610:25 AM

## 2014-08-19 MED ORDER — APIXABAN 5 MG PO TABS: 5.0000 mg | ORAL_TABLET | Freq: Two times a day (BID) | ORAL | Status: DC

## 2014-08-19 MED ORDER — AMIODARONE HCL 200 MG PO TABS: 400.0000 mg | ORAL_TABLET | Freq: Two times a day (BID) | ORAL | Status: DC

## 2014-08-19 NOTE — Care Management Note (Signed)
Case Management Note  Patient Details  Name: Hector [name] MRN: [id number] Date of Birth: 04/06/1965  Subjective/Objective:     Discussed medication costs with the [facility] pharmacist for amiodarone 200mg  PO 2 tablets BID x 6 days the 1 tablet BID x 1 day. Not on [facility] $4.00 list and not available through [facility] because [name] has Medicaid and claims that he has no money for co-pays until the end of this month. [name] was also provided with a coupon for Eliquis.                Action/Plan:   Expected Discharge Date:                  Expected Discharge Plan:     In-House Referral:     Discharge planning Services     Post Acute Care Choice:    Choice offered to:     DME Arranged:    DME Agency:     HH Arranged:    [place] Agency:     Status of Service:     Medicare Important Message Given:    Date Medicare IM Given:    Medicare IM give by:    Date Additional Medicare IM Given:    Additional Medicare Important Message give by:     If discussed at [place] of Stay Meetings, dates discussed:    Additional Comments:  Mikiya Nebergall A, RN 08/19/2014, 9:20 AM

## 2014-08-19 NOTE — Discharge Summary (Signed)
[place] [facility] [place] NAME: Hector [name]    MR#:  [id number]  DATE OF BIRTH:  [birth year]  DATE OF ADMISSION:  08/14/2014 ADMITTING PHYSICIAN: Henreitta Leber, MD  DATE OF DISCHARGE:  08/14/2014 PRIMARY CARE PHYSICIAN: [name], MD    ADMISSION DIAGNOSIS:  Precordial chest pain [R07.2] Atrial flutter with rapid ventricular response [I48.92]   DISCHARGE DIAGNOSIS:  Atrial flutter with rapid ventricular response  SECONDARY DIAGNOSIS:   Past Medical History  Diagnosis Date  . Hyperlipidemia   . Elevated troponin     Chronically elevated. - level was 0.17-0.10 during recent admission  . Alcoholic cardiomyopathy 123XX123    a) Myoview: 11/'09: EF 28% with globally decreased function. No inducible ischemia, fixed inferoapical defect = Infarct vs.artifact (not commensurate with extent of reduced EF); b) Echo 8/'12: Mild LV dilation, Mod-Severe global hypokinesis - EF 25-35%. Mild-moderate RV dilation no significant bowel lesion mild-moderate MR; c) Echo Jan 2016: EF 20-25%, high LAP, mild-moderate LV dilation, severe L  . Chronic combined systolic and diastolic heart failure, NYHA class 2     a. see above  . Alcohol abuse   . Essential hypertension   . CKD (chronic kidney disease), stage III   . GI bleed 11/2013  . Myocardial infarct 2004  . Pancreatitis   . Atrial flutter     a. new onset 07/2014; b. s/p unsuccessful TEE/DCCV 08/16/2014; c. on apixaban   . Atrial fibrillation     a. new onset s/p unsuccessful TEE/DCCV on 08/16/2014; b. on apixaban     HOSPITAL COURSE:   #1 atrial flutter with RVR.  The patient has been treated with Coreg and aspirin. Eliquis 5 mg bid was started per cardiologist.  He underwent cardioversion and TEE, converted to NSR after procedure but back to Afib with RVR. He was started on amiodarone drip, which was discontinued and changed to 400 mg po bid for 7 days then 200 mg bid per cardiologist.   HR is back to normal sinus rhythm since yesterday and controlled.    #2 chest pain with elevated troponin-likely due to demand ischemia secondary to atrial flutter. Treated with aspirin, Coreg and atorvastatin.  #3 history of CHF/systolic EF of 123XX123 and Alcoholic cardiomyopathy He has been treated with Lasix, Coreg, Entresto, lisinopril was  discontinued per Dr. Ellyn Hack.  # 4. CKD stage 3. Stable.  DISCHARGE CONDITIONS:   Stable.  CONSULTS OBTAINED:  Treatment Team:  [name], MD  DRUG ALLERGIES:   Allergies  Allergen Reactions  . Eggs Or Egg-Derived Products Rash    DISCHARGE MEDICATIONS:   Current Discharge Medication List    START taking these medications   Details  amiodarone (PACERONE) 200 MG tablet Take 2 tablets (400 mg total) by mouth 2 (two) times daily. 2 tab po bid for 6 days, then 1 tab po bid. Qty: 72 tablet, Refills: 0    apixaban (ELIQUIS) 5 MG TABS tablet Take 1 tablet (5 mg total) by mouth 2 (two) times daily. Qty: 60 tablet, Refills: 0      CONTINUE these medications which have NOT CHANGED   Details  aspirin EC 81 MG tablet Take 81 mg by mouth daily.    atorvastatin (LIPITOR) 80 MG tablet Take 80 mg by mouth daily.    carvedilol (COREG) 25 MG tablet Take 25 mg by mouth 2 (two) times daily with a meal.    furosemide (LASIX) 40 MG tablet Take 40 mg  by mouth daily.    omeprazole (PRILOSEC) 20 MG capsule Take 20 mg by mouth daily.    sacubitril-valsartan (ENTRESTO) 24-26 MG Take 1 tablet by mouth 2 (two) times daily.      STOP taking these medications     lisinopril (PRINIVIL,ZESTRIL) 40 MG tablet          DISCHARGE INSTRUCTIONS:    If you experience worsening of your admission symptoms, develop shortness of breath, life threatening emergency, suicidal or homicidal thoughts you must seek medical attention immediately by calling 911 or calling your MD immediately  if symptoms less severe.  You Must read complete  instructions/literature along with all the possible adverse reactions/side effects for all the Medicines you take and that have been prescribed to you. Take any new Medicines after you have completely understood and accept all the possible adverse reactions/side effects.   Please note  You were cared for by a hospitalist during your hospital stay. If you have any questions about your discharge medications or the care you received while you were in the hospital after you are discharged, you can call the unit and asked to speak with the hospitalist on call if the hospitalist that took care of you is not available. Once you are discharged, your primary care physician will handle any further medical issues. Please note that NO REFILLS for any discharge medications will be authorized once you are discharged, as it is imperative that you return to your primary care physician (or establish a relationship with a primary care physician if you do not have one) for your aftercare needs so that they can reassess your need for medications and monitor your lab values.    Today   SUBJECTIVE    No complaint  VITAL SIGNS:  Blood pressure 128/89, pulse 67, temperature 98.2 F (36.8 C), temperature source Oral, resp. rate 18, height 5\' 11"  (1.803 m), weight 123.877 kg (273 lb 1.6 oz), SpO2 95 %.  I/O:   Intake/Output Summary (Last 24 hours) at 08/19/14 0946 Last data filed at 08/19/14 0700  Gross per 24 hour  Intake 2280.39 ml  Output   1625 ml  Net 655.39 ml    PHYSICAL EXAMINATION:  GENERAL:  49 y.o.-year-old patient lying in the bed with no acute distress.  EYES: Pupils equal, round, reactive to light and accommodation. No scleral icterus. Extraocular muscles intact.  HEENT: Head atraumatic, normocephalic. Oropharynx and nasopharynx clear.  NECK:  Supple, no jugular venous distention. No thyroid enlargement, no tenderness.  LUNGS: Normal breath sounds bilaterally, no wheezing, rales,rhonchi or  crepitation. No use of accessory muscles of respiration.  CARDIOVASCULAR: S1, S2 normal. No murmurs, rubs, or gallops.  ABDOMEN: Soft, non-tender, non-distended. Bowel sounds present. No organomegaly or mass.  EXTREMITIES: No pedal edema, cyanosis, or clubbing.  NEUROLOGIC: Cranial nerves II through XII are intact. Muscle strength 5/5 in all extremities. Sensation intact. Gait not checked.  PSYCHIATRIC: The patient is alert and oriented x 3.  SKIN: No obvious rash, lesion, or ulcer.   DATA REVIEW:   CBC  Recent Labs Lab 08/14/14 1217  WBC 9.2  HGB 13.9  HCT 44.0  PLT 228    Chemistries   Recent Labs Lab 08/14/14 1217 08/18/14 0516  NA 136  --   K 4.0  --   CL 105  --   CO2 23  --   GLUCOSE 131*  --   BUN 32*  --   CREATININE 2.39* 2.07*  CALCIUM 8.4*  --  MG 1.7  --   AST 26  --   ALT 17  --   ALKPHOS 76  --   BILITOT 1.2  --     Cardiac Enzymes  Recent Labs Lab 08/14/14 2327  TROPONINI 0.09*    Microbiology Results  Results for orders placed or performed during the hospital encounter of 08/14/14  Urine culture     Status: None   Collection Time: 08/15/14  8:52 AM  Result Value Ref Range Status   Specimen Description URINE, RANDOM  Final   Special Requests NONE  Final   Culture NO GROWTH 1 DAY  Final   Report Status 08/16/2014 FINAL  Final    RADIOLOGY:  No results found.      Management plans discussed with the patient, family and they are in agreement.  CODE STATUS:     Code Status Orders        Start     Ordered   08/14/14 1502  Full code   Continuous     08/14/14 1501      TOTAL TIME TAKING CARE OF THIS PATIENT: 37 minutes.    Demetrios Loll M.D on 08/19/2014 at 9:46 AM  Between 7am to 6pm - Pager - [phone]  After 6pm go to [email or web address] - password [email or web address] [place] [facility]  Office  [phone]  CC: Primary care physician; [name], MD

## 2014-08-19 NOTE — Progress Notes (Signed)
Pt discharged home, [facility] pharmacy filled his amiodarone scrip and pt was also given a coupon for eliquis, IV site DCd, bleeding controlled, pt given his f/u appt and scrips and meds, understanding verbalized, tele monitor turned in, pt left hospital in car with his friend.

## 2014-08-20 ENCOUNTER — Telehealth: Payer: Self-pay | Admitting: Internal Medicine

## 2014-08-20 NOTE — Telephone Encounter (Signed)
TOC call:  Vanita Panda [facility] Triage           08/21/2014  Dr. Sheralyn Boatman DR. McAlhany)  9:45

## 2014-08-20 NOTE — Telephone Encounter (Signed)
Patient contacted regarding discharge from [facility] on 08/19/14.  Patient understands to follow up with Dr. Caryl Comes on Tuesday 08/21/14 at 9:45 am (advised to arrive at 9:30 am for check in) at the [facility] office . Patient understands discharge instructions? Yes Patient understands medications and regiment? Yes Patient understands to bring all medications to this visit? Yes  - the patient was sent home with a bottle of amiodarone to take. - he states he does not have Eliquis and will not be able to fill this until Friday - I advised we would look for samples and give these to him at his follow up visit tomorrow.

## 2014-08-21 ENCOUNTER — Encounter: Payer: Self-pay | Admitting: Internal Medicine

## 2014-08-21 ENCOUNTER — Ambulatory Visit (INDEPENDENT_AMBULATORY_CARE_PROVIDER_SITE_OTHER): Payer: Medicaid Other | Admitting: Internal Medicine

## 2014-08-21 VITALS — BP 108/84 | HR 65 | Ht 71.0 in | Wt 273.2 lb

## 2014-08-21 DIAGNOSIS — I4891 Unspecified atrial fibrillation: Secondary | ICD-10-CM | POA: Diagnosis not present

## 2014-08-21 NOTE — Progress Notes (Signed)
ELECTROPHYSIOLOGY CONSULT NOTE  [name], MRN: [id number], DOB/AGE: Sep 07, 1965 49 y.o. Admit date: (Not on file) Date of Consult: 08/21/2014  Primary Physician: [name], NP Primary Cardiologist: [facility]  Chief Complaint: Not clear   HPI Hector [name] is a 49 y.o. male   seen following a recent hospitalization for congestive heart failure in the context of atrial flutter -atypical with rapid rates  He reverted to atrial fibrillation admitting was started on amiodarone.he was also treated with apixaban;  Unfortunately, he left the hospital without access to medication. Thankfully there have been no interval events.  He has had repeated episodes of tachypalpitations associated with congestive heart failure manifested as orthopnea. Apart from his arrhythmias, he does not have orthopnea nocturnal dyspnea edema. He is able to walk a mile plus today.  He's had no syncope.  He is on Entresto carvedilol and diuretics.  He has had sleep disordered breathing. He has not had a sleep study  He has a history of a presumed nonischemic cardiomyopathy with ejection fractions measured 20-25% range over the last  7 years. He has a remote history of alcohol abuse but has been apparently sober for 3 years.   Past Medical History  Diagnosis Date  . Hyperlipidemia   . Elevated troponin     Chronically elevated. - level was 0.17-0.10 during recent admission  . Alcoholic cardiomyopathy 123XX123    a) Myoview: 11/'09: EF 28% with globally decreased function. No inducible ischemia, fixed inferoapical defect = Infarct vs.artifact (not commensurate with extent of reduced EF); b) Echo 8/'12: Mild LV dilation, Mod-Severe global hypokinesis - EF 25-35%. Mild-moderate RV dilation no significant bowel lesion mild-moderate MR; c) Echo Jan 2016: EF 20-25%, high LAP, mild-moderate LV dilation, severe L  . Chronic combined systolic and diastolic heart failure, NYHA class 2     a. see above    . Alcohol abuse   . Essential hypertension   . CKD (chronic kidney disease), stage III   . GI bleed 11/2013  . Myocardial infarct 2004  . Pancreatitis   . Atrial flutter     a. new onset 07/2014; b. s/p unsuccessful TEE/DCCV 08/16/2014; c. on apixaban   . Atrial fibrillation     a. new onset s/p unsuccessful TEE/DCCV on 08/16/2014; b. on apixaban       Surgical History:  Past Surgical History  Procedure Laterality Date  . Knee surgery Right   . Nm myoview ltd  November 2011    No ischemia or infarction. EF 50-55% ( no improvement from 2009 Myoview EF of 28%     Home Meds: Prior to Admission medications   Medication Sig Start Date End Date Taking? Authorizing Provider  amiodarone (PACERONE) 200 MG tablet Take 2 tablets (400 mg total) by mouth 2 (two) times daily. 2 tab po bid for 6 days, then 1 tab po bid. 08/19/14  Yes Demetrios Loll, MD  aspirin EC 81 MG tablet Take 81 mg by mouth daily.   Yes Historical Provider, MD  atorvastatin (LIPITOR) 80 MG tablet Take 80 mg by mouth daily.   Yes Historical Provider, MD  carvedilol (COREG) 25 MG tablet Take 25 mg by mouth 2 (two) times daily with a meal.   Yes Historical Provider, MD  furosemide (LASIX) 40 MG tablet Take 40 mg by mouth daily.   Yes Historical Provider, MD  omeprazole (PRILOSEC) 20 MG capsule Take 20 mg by mouth daily.   Yes Historical Provider, MD  apixaban (  ELIQUIS) 5 MG TABS tablet Take 1 tablet (5 mg total) by mouth 2 (two) times daily. Patient not taking: Reported on 08/21/2014 08/19/14   Demetrios Loll, MD  sacubitril-valsartan (ENTRESTO) 24-26 MG Take 1 tablet by mouth 2 (two) times daily. 05/15/14   Alisa Graff, FNP    Inpatient Medications:     Allergies:  Allergies  Allergen Reactions  . Eggs Or Egg-Derived Products Rash    History   Social History  . Marital Status: Single    Spouse Name: N/A  . Number of Children: N/A  . Years of Education: N/A   Occupational History  . Not on file.   Social History Main  Topics  . Smoking status: Former Smoker -- 1.00 packs/day for 12 years    Quit date: 02/23/1998  . Smokeless tobacco: Never Used  . Alcohol Use: No     Comment: 4 heavy alcohol drinker. Quit 2 years ago.  . Drug Use: No  . Sexual Activity: Not on file   Other Topics Concern  . Not on file   Social History Narrative     Family History  Problem Relation Age of Onset  . Hypertension Mother   . Hyperlipidemia Mother   . Diabetes Mother      ROS:  Please see the history of present illness.     All other systems reviewed and negative.    Physical Exam: Blood pressure 108/84, pulse 65, height 5\' 11"  (1.803 m), weight 273 lb 4 oz (123.945 kg). General: Well developed, well nourished male in no acute distress. Head: Normocephalic, atraumatic, sclera non-icteric, no xanthomas, nares are without discharge. EENT: normal Lymph Nodes:  none Back: without scoliosis/kyphosis, no CVA tendersness Neck: Negative for carotid bruits. JVD not elevated. Lungs: Clear bilaterally to auscultation without wheezes, rales, or rhonchi. Breathing is unlabored. Heart: RRR with S1 S2.  2/6 systolic  murmur , rubs, or gallops appreciated. Abdomen: Soft, non-tender, non-distended with normoactive bowel sounds. No hepatomegaly. No rebound/guarding. No obvious abdominal masses. Msk:  Strength and tone appear normal for age. Extremities: No clubbing or cyanosis. No  edema.  Distal pedal pulses are 2+ and equal bilaterally. Skin: Warm and Dry Neuro: Alert and oriented X 3. CN III-XII intact Grossly normal sensory and motor function . Psych:  Responds to questions appropriately with a normal affect.      Labs: Cardiac Enzymes No results for input(s): CKTOTAL, CKMB, TROPONINI in the last 72 hours. CBC Lab Results  Component Value Date   WBC 9.2 08/14/2014   HGB 13.9 08/14/2014   HCT 44.0 08/14/2014   MCV 80.3 08/14/2014   PLT 228 08/14/2014   PROTIME: No results for input(s): LABPROT, INR in the last  72 hours. Chemistry  Recent Labs Lab 08/14/14 1217 08/18/14 0516  NA 136  --   K 4.0  --   CL 105  --   CO2 23  --   BUN 32*  --   CREATININE 2.39* 2.07*  CALCIUM 8.4*  --   PROT 6.9  --   BILITOT 1.2  --   ALKPHOS 76  --   ALT 17  --   AST 26  --   GLUCOSE 131*  --    Lipids No results found for: CHOL, HDL, LDLCALC, TRIG BNP No results found for: PROBNP Thyroid Function Tests: No results for input(s): TSH, T4TOTAL, T3FREE, THYROIDAB in the last 72 hours.  Invalid input(s): FREET3    Miscellaneous No results found for: DDIMER  Radiology/Studies:  Dg Chest Portable 1 View  08/14/2014   CLINICAL DATA:  Chest pain  EXAM: PORTABLE CHEST - 1 VIEW  COMPARISON:  03/07/2014  FINDINGS: Cardiac enlargement. Vascular congestion with fluid overload. Negative for edema. No pleural effusion.  IMPRESSION: Cardiac enlargement with mild heart failure.  Negative for edema.   Electronically Signed   By: Franchot Gallo M.D.   On: 08/14/2014 13:07    EKG:  ECG 6/21 demonstrated atrial flutter with 2 to one conduction  6/23 demonstrated atrial fibrillation with a rapid rate  ECG today demonstrates sinus rhythm at 65 with frequent PACs Intervals 17/12/47 there is poor R wave progression  Outside records reviewed demonstrating  Left ventricular function and 20-25%.  There was severe left atrial dilatation; there is moderate RVE and RV dysfunction.  Creatinine 1.886/16   Assessment and Plan: Cardiomyopathy presumed nonischemic  Atrial flutter/fibrillation  Congestive heart failure-chronic-systolic-class II  Obstructive sleep apnea-non-diagnosed   The patient has recurrent atrial fibrillation associated with heart failure. He is also atrial flutter. His echo was read qualitatively; a severe left atrial dilatation does not make it likely that long-term rhythm control is going to be successful even with drugs or with ablative procedures. I think it is reasonable at this juncture to  continue amiodarone; he will need surveillance laboratories when he comes back to see Dr. Ellyn Hack in July.  I stressed the importance of anticoagulation; his NOAC has been refilled.  He has long-standing nonischemic (presumed) cardiomyopathy. There was a defect on his Myoview scanning years ago; and it would be reasonable at least at once to undertake catheterization to exclude an ischemic component to his cardiomyopathy. I will defer this to Dr. [facility]  In addition, with his long-standing cardiomyopathy he would be an appropriate candidate for an ICD. I have reviewed this with him; I've asked the review this also with Dr. [facility]  I think he would benefit from an outpatient sleep study.  We will stop his aspirin.     Virl Axe

## 2014-08-21 NOTE — Patient Instructions (Signed)
Medication Instructions: - Stop aspirin  Labwork: - none  Procedures/Testing: - none  Follow-Up: - Dr. Caryl Comes will see you back on an as needed basis.  - Follow up with Dr. Ellyn Hack as scheduled  Any Additional Special Instructions Will Be Listed Below (If Applicable). - none

## 2014-09-12 ENCOUNTER — Other Ambulatory Visit: Payer: Self-pay | Admitting: Family

## 2014-09-12 MED ORDER — FUROSEMIDE 40 MG PO TABS: 40.0000 mg | ORAL_TABLET | Freq: Every day | ORAL | Status: DC

## 2014-09-12 MED ORDER — APIXABAN 5 MG PO TABS: 5.0000 mg | ORAL_TABLET | Freq: Two times a day (BID) | ORAL | Status: DC

## 2014-09-12 MED ORDER — OMEPRAZOLE 20 MG PO CPDR: 20.0000 mg | DELAYED_RELEASE_CAPSULE | Freq: Every day | ORAL | Status: DC

## 2014-09-12 MED ORDER — SACUBITRIL-VALSARTAN 24-26 MG PO TABS: 1.0000 | ORAL_TABLET | Freq: Two times a day (BID) | ORAL | Status: DC

## 2014-09-12 MED ORDER — ATORVASTATIN CALCIUM 80 MG PO TABS: 80.0000 mg | ORAL_TABLET | Freq: Every day | ORAL | Status: DC

## 2014-09-12 MED ORDER — AMIODARONE HCL 200 MG PO TABS: 200.0000 mg | ORAL_TABLET | Freq: Two times a day (BID) | ORAL | Status: DC

## 2014-09-19 ENCOUNTER — Encounter: Payer: Self-pay | Admitting: Cardiology

## 2014-09-19 ENCOUNTER — Ambulatory Visit (INDEPENDENT_AMBULATORY_CARE_PROVIDER_SITE_OTHER): Payer: Medicaid Other | Admitting: Cardiology

## 2014-09-19 VITALS — BP 84/54 | HR 53 | Ht 71.0 in | Wt 278.5 lb

## 2014-09-19 DIAGNOSIS — I5042 Chronic combined systolic (congestive) and diastolic (congestive) heart failure: Secondary | ICD-10-CM | POA: Diagnosis not present

## 2014-09-19 DIAGNOSIS — N183 Chronic kidney disease, stage 3 unspecified: Secondary | ICD-10-CM

## 2014-09-19 DIAGNOSIS — E785 Hyperlipidemia, unspecified: Secondary | ICD-10-CM

## 2014-09-19 DIAGNOSIS — I429 Cardiomyopathy, unspecified: Secondary | ICD-10-CM

## 2014-09-19 DIAGNOSIS — I428 Other cardiomyopathies: Secondary | ICD-10-CM

## 2014-09-19 DIAGNOSIS — I480 Paroxysmal atrial fibrillation: Secondary | ICD-10-CM

## 2014-09-19 DIAGNOSIS — I4892 Unspecified atrial flutter: Secondary | ICD-10-CM | POA: Diagnosis not present

## 2014-09-19 MED ORDER — CARVEDILOL 12.5 MG PO TABS: 12.5000 mg | ORAL_TABLET | Freq: Two times a day (BID) | ORAL | Status: DC

## 2014-09-19 NOTE — Progress Notes (Signed)
PATIENT: Hector [name] MRN: [id number] DOB: Sep 29, 1965 PCP: [name], NP  Clinic Note: Chief Complaint  Patient presents with  . other    F/u TEE no complaints. Meds reviewed verbally with pt.  . Congestive Heart Failure  . Atrial Fibrillation    with atrial flutter    HPI: Hector [name] is a 49 y.o. male with a PMH below who presents today for cardiology followup for her heart failure (combined systolic and diastolic) in the context of atrial flutter/fibrillation. 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 Apparently this improved after he quit drinking alcohol.. I saw him in February 2016. He was actually admitted to the hospital at [facility] June 21 with atrial flutter with rapid response. He was cardioverted(TEE/DCCV), then reverted to atrial fibrillation and was started on amiodarone as well as Eliquis. He was started on chest low as well as carvedilol and diuretics.  He saw Hector [name] from electrophysiology in the clinic on June 28 to determine further treatment options for atrial fibrillation/flutter as well as consider possibility of ICD referral.  Hector [name] recommendation was to continue the amiodarone as he would not likely be a good candidate for ablative procedures to be discussed consider the possibility of cardiac catheterization to exclude an ischemic component of cardiomyopathy. And also agreed that he would be an appropriate candidate for ICD.  Studies Reviewed:   2D Echo 1/13/'16:   Severely reduced EF of 20-25%. Elevated LAP, impaired LV relaxation (GR 1 DD) mild-moderate LV dilation with severe LA dilation. Mild-mild MR and TR.  TEE/DCCV 6/23/'16:  Study Conclusions  - Left ventricle: Systolic function was severely reduced. The estimated ejection fraction was in the range of 20% to 25%. Diffuse hypokinesis. The study is not  technically sufficient to allow evaluation of LV diastolic function. No evidence of thrombus. - Mitral valve: There was mild regurgitation. - Left atrium: No evidence of thrombus in the atrial cavity or appendage. No evidence of thrombus in the appendage. The appendage was moderately to severely dilated. Emptying velocity was normal. - Right ventricle: The cavity size was moderately dilated. Systolic function was moderately to severely reduced. - Right atrium: The atrium was mildly dilated. - Atrial septum: No defect or patent foramen ovale was identified.  Impressions:  - Successful cardioversion. No cardiac source of emboli was indentified in the LA, LA appendage or LV.  Interval History: Hector [name] presents today well overall her cardiac standpoint. He says he is on occasion able to walk about a mile without getting shortness or breath. She says he we'll do more but he has trouble with his knee pain. He describes having occasional episodes of what the dizziness, but really denies any syncope or near syncope. No rapid/irregular heartbeat/palpitations. He maintains his weights relatively stably with the 40 mg of Lasix. He has not had to take additional doses very frequently. No PND, orthopnea with mild end of day edema.  The remainder of cardiac review of systems is as follows: Cardiovascular ROS: no chest pain or dyspnea on exertion negative for - loss of consciousness, orthopnea, palpitations, paroxysmal nocturnal dyspnea, rapid heart rate, shortness of breath or TIA/amaurosis fugax, syncope/near syncope :   Past Medical History  Diagnosis Date  . Hyperlipidemia   . Elevated troponin     Chronically elevated. - level was 0.17-0.10 during recent admission  . Alcoholic cardiomyopathy 123XX123    a) Myoview:  11/'09: EF 28% with globally decreased function. No inducible ischemia, fixed inferoapical defect = Infarct vs.artifact (not commensurate with extent of reduced EF); b) Echo  8/'12: Mild LV dilation, Mod-Severe global hypokinesis - EF 25-35%. Mild-moderate RV dilation no significant bowel lesion mild-moderate MR; c) Echo Jan 2016: EF 20-25%, high LAP, mild-moderate LV dilation, severe L  . Chronic combined systolic and diastolic heart failure, NYHA class 2     a. see above  . Alcohol abuse   . Essential hypertension   . CKD (chronic kidney disease), stage III   . GI bleed 11/2013  . Myocardial infarct 2004  . Pancreatitis   . Atrial flutter     a. new onset 07/2014; b. s/p unsuccessful TEE/DCCV 08/16/2014; c. on apixaban   . Atrial fibrillation     a. new onset s/p unsuccessful TEE/DCCV on 08/16/2014; b. on apixaban   . Sleep-disordered breathing     Has yet to have a sleep study    Prior Cardiac Evaluation and Past Surgical History: Past Surgical History  Procedure Laterality Date  . Knee surgery Right   . Nm myoview ltd  November 2011    No ischemia or infarction. EF 50-55% ( no improvement from 2009 Myoview EF of 28%    Allergies  Allergen Reactions  . Eggs Or Egg-Derived Products Rash    Current Outpatient Prescriptions  Medication Sig Dispense Refill  . amiodarone (PACERONE) 200 MG tablet Take 1 tablet (200 mg total) by mouth 2 (two) times daily. 60 tablet 3  . apixaban (ELIQUIS) 5 MG TABS tablet Take 1 tablet (5 mg total) by mouth 2 (two) times daily. 60 tablet 5  . atorvastatin (LIPITOR) 80 MG tablet Take 1 tablet (80 mg total) by mouth daily. 30 tablet 5  . furosemide (LASIX) 40 MG tablet Take 1 tablet (40 mg total) by mouth daily. 30 tablet 5  . omeprazole (PRILOSEC) 20 MG capsule Take 1 capsule (20 mg total) by mouth daily. 30 capsule 5  . sacubitril-valsartan (ENTRESTO) 24-26 MG Take 1 tablet by mouth 2 (two) times daily. 60 tablet 5  . carvedilol (COREG) 12.5 MG tablet Take 1 tablet (12.5 mg total) by mouth 2 (two) times daily. 30 tablet 3   No current facility-administered medications for this visit.    History   Social History  Narrative    family history includes Diabetes in his mother; Hyperlipidemia in his mother; Hypertension in his mother.  ROS: A comprehensive Review of Systems - was performed Review of Systems  Constitutional: Positive for malaise/fatigue.  HENT: Negative for nosebleeds.   Eyes: Negative for blurred vision.  Respiratory: Positive for shortness of breath (Only with significant exertion) and wheezing (Occasional).   Cardiovascular: Negative for claudication.  Gastrointestinal: Negative for heartburn, blood in stool and melena.  Genitourinary: Negative for hematuria.  Musculoskeletal: Positive for joint pain (Bilateral knee).  Neurological: Positive for dizziness.  Endo/Heme/Allergies: Does not bruise/bleed easily.  Psychiatric/Behavioral: Negative.   All other systems reviewed and are negative.  PHYSICAL EXAM BP 84/54 mmHg  Pulse 53  Ht 5\' 11"  (1.803 m)  Wt 126.327 kg (278 lb 8 oz)  BMI 38.86 kg/m2 General appearance: alert, cooperative, appears stated age, no distress, moderately obese and Somewhat standoffish but pleasant mood and affect. Neck: JVD - 2 cm above sternal notch, no adenopathy, no carotid bruit, supple, symmetrical, trachea midline and thyroid not enlarged, symmetric, no tenderness/mass/nodules Lungs: normal percussion bilaterally and Diffuse interstitial sounds, no rhonchi or rales. Nonlabored. Heart: RR -  bradycardic, normal S1, S2 with positive S4. Slightly lateral PMI, but nonsustained; 2/6 HSM at apex. High pitched blowing. Abdomen: soft, non-tender; bowel sounds normal; no masses,  no organomegaly and Obese Extremities: Trace lower extremity edema. No clubbing cyanosis Pulses: 2+ and symmetric Neurologic: Mental status: Alert, oriented, thought content appropriate, affect: normal Cranial nerves: normal   Adult ECG Report  Rate: 53 ;  Rhythm: sinus bradycardia  QRS Axis: -37 - left axis deviation and borderline LAFB;  PR Interval: 174 ;  QRS Duration: 120 ;  QTc: 464,  Voltages: normal; nonspecific T wave changes with T wave inversions in 1 and aVL. Poor progression in precordial leads nonspecific.    Narrative Interpretation: compared to last visit but rate has slowed.  Recent Labs:  Lab Results  Component Value Date   CREATININE 2.07* 08/18/2014   Lab Results  Component Value Date   K 4.0 08/14/2014   Lab Results  Component Value Date   WBC 9.2 08/14/2014   HGB 13.9 08/14/2014   HCT 44.0 08/14/2014   MCV 80.3 08/14/2014   PLT 228 08/14/2014    ASSESSMENT / PLAN: Pleasant gentleman with long-standing cardiomyopathy.  Problem List Items Addressed This Visit    Chronic combined systolic and diastolic heart failure, NYHA class 2 (Chronic)    Continues to do well with sliding scale Lasix as described during last visit. He is on good dose of Entresto, but we'll need to reduce carvedilol dose. Consider adding spironolactone at next visit. He just is reluctant to take new medications at this point the      Relevant Medications   carvedilol (COREG) 12.5 MG tablet   Chronic kidney disease (CKD), stage III (moderate) (Chronic)    If he were to need cardiac catheterization, will be done without LV gram, and would likely need gentle hydration based on his renal function. Need to closely monitor creatinine as he is on Eliquis.      Hyperlipidemia (Chronic)    On high dose statin. We will check lipids along with the labs follow next visit.      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 He had a negative Myoview back in 2009 Limited diagnosis. Certainly a consideration is to go forward with ICD placement, I would probably consider catheterization to simply to exclude coronary disease. He currently is not sure which direction he was to go, and therefore will address a followup visit.      Relevant Medications   carvedilol (COREG) 12.5 MG tablet    Paroxysmal atrial fibrillation - Primary (Chronic)    Currently in sinus bradycardia on amiodarone and and high-dose beta blocker. Anticoagulated Eliquis. He is having somewhat of a dizziness. With his bradycardia, and hypotension today, I will back off on carvedilol to 12.5 mg twice a day.  With his cardiomyopathy, would benefit from maintaining sinus rhythm therefore we'll continue amiodarone.  He will need TSH and LFTs ordered at his next visit. Also we'll need to order PFTs.      Relevant Medications   carvedilol (COREG) 12.5 MG tablet   Other Relevant Orders   EKG 12-Lead (Completed)   Paroxysmal atrial flutter (Chronic)    I guess the plan is to not consider atrial flutter ablation but this is simply treat him/flutter with amiodarone.      Relevant Medications   carvedilol (COREG) 12.5 MG tablet      Meds ordered this  encounter  Medications  . carvedilol (COREG) 12.5 MG tablet    Sig: Take 1 tablet (12.5 mg total) by mouth 2 (two) times daily.    Dispense:  30 tablet    Refill:  3    Followup: 3 months   Hector [name], M.D., M.S. Interventional Cardiolgy [facility]

## 2014-09-19 NOTE — Patient Instructions (Signed)
Medication Instructions:  Your physician has recommended you make the following change in your medication:  DECREASE carvedilol to 12.5 mg twice per day   Labwork: none  Testing/Procedures: none  Follow-Up: Your physician recommends that you schedule a follow-up appointment in: three months with Dr. Ellyn Hack   Any Other Special Instructions Will Be Listed Below (If Applicable). Weigh yourself everyday and adjust lasix as Dr. Ellyn Hack discussed

## 2014-09-21 ENCOUNTER — Encounter: Payer: Self-pay | Admitting: Cardiology

## 2014-09-21 DIAGNOSIS — I428 Other cardiomyopathies: Secondary | ICD-10-CM | POA: Insufficient documentation

## 2014-09-21 NOTE — Assessment & Plan Note (Signed)
On high dose statin. We will check lipids along with the labs follow next visit.

## 2014-09-21 NOTE — Assessment & Plan Note (Signed)
Continues to do well with sliding scale Lasix as described during last visit. He is on good dose of Entresto, but we'll need to reduce carvedilol dose. Consider adding spironolactone at next visit. He just is reluctant to take new medications at this point the

## 2014-09-21 NOTE — Assessment & Plan Note (Signed)
If he were to need cardiac catheterization, will be done without LV gram, and would likely need gentle hydration based on his renal function. Need to closely monitor creatinine as he is on Eliquis.

## 2014-09-21 NOTE — Assessment & Plan Note (Signed)
I guess the plan is to not consider atrial flutter ablation but this is simply treat him/flutter with amiodarone.

## 2014-09-21 NOTE — Assessment & Plan Note (Addendum)
Currently in sinus bradycardia on amiodarone and and high-dose beta blocker. Anticoagulated Eliquis. He is having somewhat of a dizziness. With his bradycardia, and hypotension today, I will back off on carvedilol to 12.5 mg twice a day.  With his cardiomyopathy, would benefit from maintaining sinus rhythm therefore we'll continue amiodarone.  He will need TSH and LFTs ordered at his next visit. Also we'll need to order PFTs.

## 2014-09-21 NOTE — Assessment & Plan Note (Signed)
Long-standing disease. He had a negative Myoview back in 2009 Limited diagnosis. Certainly a consideration is to go forward with ICD placement, I would probably consider catheterization to simply to exclude coronary disease. He currently is not sure which direction he was to go, and therefore will address a followup visit.

## 2014-10-02 ENCOUNTER — Ambulatory Visit: Payer: Medicaid Other | Attending: Family | Admitting: Family

## 2014-10-02 ENCOUNTER — Encounter: Payer: Self-pay | Admitting: Family

## 2014-10-02 VITALS — BP 132/90 | HR 53 | Resp 20 | Ht 71.0 in | Wt 281.0 lb

## 2014-10-02 DIAGNOSIS — N183 Chronic kidney disease, stage 3 (moderate): Secondary | ICD-10-CM | POA: Insufficient documentation

## 2014-10-02 DIAGNOSIS — I5042 Chronic combined systolic (congestive) and diastolic (congestive) heart failure: Secondary | ICD-10-CM | POA: Insufficient documentation

## 2014-10-02 DIAGNOSIS — I480 Paroxysmal atrial fibrillation: Secondary | ICD-10-CM

## 2014-10-02 DIAGNOSIS — Z79899 Other long term (current) drug therapy: Secondary | ICD-10-CM | POA: Diagnosis not present

## 2014-10-02 DIAGNOSIS — Z87891 Personal history of nicotine dependence: Secondary | ICD-10-CM | POA: Insufficient documentation

## 2014-10-02 DIAGNOSIS — I428 Other cardiomyopathies: Secondary | ICD-10-CM

## 2014-10-02 DIAGNOSIS — I252 Old myocardial infarction: Secondary | ICD-10-CM | POA: Diagnosis not present

## 2014-10-02 DIAGNOSIS — F1010 Alcohol abuse, uncomplicated: Secondary | ICD-10-CM | POA: Diagnosis not present

## 2014-10-02 DIAGNOSIS — E785 Hyperlipidemia, unspecified: Secondary | ICD-10-CM | POA: Diagnosis not present

## 2014-10-02 DIAGNOSIS — I4891 Unspecified atrial fibrillation: Secondary | ICD-10-CM | POA: Insufficient documentation

## 2014-10-02 DIAGNOSIS — I129 Hypertensive chronic kidney disease with stage 1 through stage 4 chronic kidney disease, or unspecified chronic kidney disease: Secondary | ICD-10-CM | POA: Diagnosis not present

## 2014-10-02 DIAGNOSIS — I10 Essential (primary) hypertension: Secondary | ICD-10-CM

## 2014-10-02 NOTE — Progress Notes (Signed)
Subjective:    [name], male    DOB: Jul 10, 1965, 49 y.o.   MRN: [id number]  Congestive Heart Failure Presents for follow-up visit. The disease course has been improving. Pertinent negatives include no abdominal pain, chest pain, edema, fatigue, orthopnea, palpitations or shortness of breath. The symptoms have been improving. Past treatments include beta blockers, salt and fluid restriction and angiotensin receptor blockers. The treatment provided moderate relief. Compliance with prior treatments has been good. His past medical history is significant for arrhythmia and HTN. Compliance with total regimen is 76-100%.   Past Medical History  Diagnosis Date  . Hyperlipidemia   . Elevated troponin     Chronically elevated. - level was 0.17-0.10 during recent admission  . Alcoholic cardiomyopathy 123XX123    a) Myoview: 11/'09: EF 28% with globally decreased function. No inducible ischemia, fixed inferoapical defect = Infarct vs.artifact (not commensurate with extent of reduced EF); b) Echo 8/'12: Mild LV dilation, Mod-Severe global hypokinesis - EF 25-35%. Mild-moderate RV dilation no significant bowel lesion mild-moderate MR; c) Echo Jan 2016: EF 20-25%, high LAP, mild-moderate LV dilation, severe L  . Chronic combined systolic and diastolic heart failure, NYHA class 2     a. see above  . Alcohol abuse   . Essential hypertension   . CKD (chronic kidney disease), stage III   . GI bleed 11/2013  . Myocardial infarct 2004  . Pancreatitis   . Atrial flutter     a. new onset 07/2014; b. s/p unsuccessful TEE/DCCV 08/16/2014; c. on apixaban   . Atrial fibrillation     a. new onset s/p unsuccessful TEE/DCCV on 08/16/2014; b. on apixaban   . Sleep-disordered breathing     Has yet to have a sleep study   Past Surgical History  Procedure Laterality Date  . Knee surgery Right   . Nm myoview ltd  November 2011    No ischemia or infarction. EF 50-55% ( no improvement from 2009  Myoview EF of 28%    Family History  Problem Relation Age of Onset  . Hypertension Mother   . Hyperlipidemia Mother   . Diabetes Mother     History  Substance Use Topics  . Smoking status: Former Smoker -- 1.00 packs/day for 12 years    Quit date: 02/23/1998  . Smokeless tobacco: Never Used  . Alcohol Use: No     Comment: 4 heavy alcohol drinker. Quit 2 years ago.    Allergies  Allergen Reactions  . Eggs Or Egg-Derived Products Rash    Prior to Admission medications   Medication Sig Start Date End Date Taking? Authorizing Provider  amiodarone (PACERONE) 200 MG tablet Take 1 tablet (200 mg total) by mouth 2 (two) times daily. 09/12/14  Yes Alisa Graff, FNP  apixaban (ELIQUIS) 5 MG TABS tablet Take 1 tablet (5 mg total) by mouth 2 (two) times daily. 09/12/14  Yes Alisa Graff, FNP  atorvastatin (LIPITOR) 80 MG tablet Take 1 tablet (80 mg total) by mouth daily. 09/12/14  Yes Alisa Graff, FNP  carvedilol (COREG) 12.5 MG tablet Take 1 tablet (12.5 mg total) by mouth 2 (two) times daily. 09/19/14  Yes [name], MD  furosemide (LASIX) 40 MG tablet Take 1 tablet (40 mg total) by mouth daily. 09/12/14  Yes Alisa Graff, FNP  omeprazole (PRILOSEC) 20 MG capsule Take 1 capsule (20 mg total) by mouth daily. 09/12/14  Yes Alisa Graff, FNP  sacubitril-valsartan (ENTRESTO) 24-26 MG Take  1 tablet by mouth 2 (two) times daily. 09/12/14  Yes Alisa Graff, FNP      Review of Systems  Constitutional: Negative for appetite change and fatigue.  HENT: Negative for congestion, rhinorrhea and sore throat.   Eyes: Negative.   Respiratory: Negative for cough, chest tightness, shortness of breath and wheezing.   Cardiovascular: Negative for chest pain and palpitations.  Gastrointestinal: Positive for abdominal distention. Negative for abdominal pain.  Endocrine: Negative.   Genitourinary: Negative.   Musculoskeletal: Positive for back pain (low back). Negative for neck pain.  Skin:  Negative.   Allergic/Immunologic: Negative.   Neurological: Negative for dizziness and light-headedness.  Hematological: Does not bruise/bleed easily.  Psychiatric/Behavioral: Negative for sleep disturbance (sleeping on 2 pillows). The patient is not nervous/anxious.        Objective:   Physical Exam  Constitutional: He is oriented to person, place, and time. He appears well-developed and well-nourished.  HENT:  Head: Normocephalic and atraumatic.  Eyes: Conjunctivae are normal. Pupils are equal, round, and reactive to light.  Neck: Normal range of motion. Neck supple.  Cardiovascular: An irregular rhythm present. Bradycardia present.   Pulmonary/Chest: Effort normal. No respiratory distress. He has no wheezes. He has no rales.  Abdominal: Soft. He exhibits no distension. There is no tenderness.  Musculoskeletal: He exhibits no edema or tenderness.  Neurological: He is alert and oriented to person, place, and time.  Skin: Skin is warm and dry.  Psychiatric: He has a normal mood and affect. His behavior is normal.  Nursing note and vitals reviewed.    BP 132/90 mmHg  Pulse 53  Resp 20  Ht 5\' 11"  (1.803 m)  Wt 281 lb (127.461 kg)  BMI 39.21 kg/m2  SpO2 97%      Assessment & Plan:  1: Chronic heart failure with reduced ejection fraction- Patient presents without any fatigue, shortness of breath or edema. He occasionally feels bloated in his abdomen but denies pain. He has not been weighing himself daily but knows that he's gained weight because he knows that he's been eating more. Weight is up 17 pounds since he was here last on 07/02/14. Discussed the importance of resuming weighing daily and to call for an overnight weight gain of >2 pounds or a weekly weight gain of >5 pounds. He is not using salt and is trying to closely follow a low sodium diet. Feels like he is quite active. 2: HTN- Blood pressure was low at his cardiologist visit and has rebounded nicely. Continue to monitor  to make sure it doesn't get much higher.  3: Atrial fibrillation- Currently rate controlled at this time. Taking amiodarone and carvedilol. He says that he doesn't notice any palpitations. Heart rate on the low side this morning and carvedilol was decreased at his last cardiology visit.    Return in 3 months or sooner for any questions/problems before the next office visit.

## 2014-10-02 NOTE — Patient Instructions (Signed)
Continue weighing daily and call for an overnight weight gain of > 2 pounds or a weekly weight gain of >5 pounds. 

## 2014-10-15 ENCOUNTER — Other Ambulatory Visit: Payer: Self-pay

## 2014-10-15 NOTE — Telephone Encounter (Signed)
Mr. Schoettle called the office and requested that we refill his medications. He is still using [facility] for his pharmacy.

## 2014-11-02 ENCOUNTER — Encounter: Payer: Self-pay | Admitting: Cardiovascular Disease

## 2014-11-21 ENCOUNTER — Other Ambulatory Visit: Payer: Self-pay

## 2014-11-21 NOTE — Telephone Encounter (Signed)
Pt called to refill his prescriptions. I advised patient that he has refills already at [facility] and he will need to call them to the pharmacist refill them.

## 2014-12-19 ENCOUNTER — Encounter: Payer: Self-pay | Admitting: Cardiology

## 2014-12-19 ENCOUNTER — Ambulatory Visit (INDEPENDENT_AMBULATORY_CARE_PROVIDER_SITE_OTHER): Payer: Medicaid Other | Admitting: Cardiology

## 2014-12-19 ENCOUNTER — Telehealth: Payer: Self-pay | Admitting: *Deleted

## 2014-12-19 VITALS — BP 150/90 | HR 49 | Ht 71.0 in | Wt 287.5 lb

## 2014-12-19 DIAGNOSIS — N183 Chronic kidney disease, stage 3 unspecified: Secondary | ICD-10-CM

## 2014-12-19 DIAGNOSIS — I429 Cardiomyopathy, unspecified: Secondary | ICD-10-CM | POA: Diagnosis not present

## 2014-12-19 DIAGNOSIS — Z01812 Encounter for preprocedural laboratory examination: Secondary | ICD-10-CM | POA: Diagnosis not present

## 2014-12-19 DIAGNOSIS — I428 Other cardiomyopathies: Secondary | ICD-10-CM

## 2014-12-19 DIAGNOSIS — I4892 Unspecified atrial flutter: Secondary | ICD-10-CM | POA: Diagnosis not present

## 2014-12-19 DIAGNOSIS — I5042 Chronic combined systolic (congestive) and diastolic (congestive) heart failure: Secondary | ICD-10-CM

## 2014-12-19 DIAGNOSIS — I10 Essential (primary) hypertension: Secondary | ICD-10-CM

## 2014-12-19 DIAGNOSIS — E785 Hyperlipidemia, unspecified: Secondary | ICD-10-CM

## 2014-12-19 MED ORDER — AMIODARONE HCL 200 MG PO TABS: 200.0000 mg | ORAL_TABLET | Freq: Every day | ORAL | Status: DC

## 2014-12-19 MED ORDER — SACUBITRIL-VALSARTAN 49-51 MG PO TABS: 1.0000 | ORAL_TABLET | Freq: Two times a day (BID) | ORAL | Status: DC

## 2014-12-19 NOTE — Telephone Encounter (Signed)
Pharmacy calling stating that we just sent in a new refill on Entresto  That we would need to do a new Prior Auth. Please call back if we have any questions.

## 2014-12-19 NOTE — Assessment & Plan Note (Signed)
On high-dose statin. We can check fasting lipid panel when he comes in for his procedure, since he will be nothing by mouth.

## 2014-12-19 NOTE — Patient Instructions (Signed)
Medication Instructions:  Your physician has recommended you make the following change in your medication:  DECREASE amiodarone to once per day INCREASE Entresto to 49-51 twice per day   Labwork: BMET, CBC, PT/INR  Testing/Procedures: Your physician has requested that you have a cardiac catheterization. Cardiac catheterization is used to diagnose and/or treat various heart conditions. Doctors may recommend this procedure for a number of different reasons. The most common reason is to evaluate chest pain. Chest pain can be a symptom of coronary artery disease (CAD), and cardiac catheterization can show whether plaque is narrowing or blocking your heart's arteries. This procedure is also used to evaluate the valves, as well as measure the blood flow and oxygen levels in different parts of your heart. For further information please visit [email or web address]. Please follow instruction sheet, as given.  [facility] Cardiac Cath Instructions   You are scheduled for a Cardiac Cath on November 16  Please arrive at 6:30am on the day of your procedure  Do not eat/drink anything after midnight  Someone will need to drive you home  It is recommended someone be with you for the first 24 hours after your procedure  Wear clothes that are easy to get on/off and wear slip on shoes if possible   Medications bring a current list of all medications with you    _xx__ Do not take these medications before your procedure: Stop eliquis two days before procedure. Hold carvedilol the day before and morning of your procedure. Hold lasix the morning of your procedure.   Day of your procedure: Arrive at the [facility] entrance.  Free valet service is available.  After entering the [place] please check-in at the registration desk (1st desk on your right) to receive your armband. After receiving your armband someone will escort you to the cardiac cath/special procedures waiting area.  The usual length of stay  after your procedure is about 2 to 3 hours.  This can vary.  If you have any questions, please call our office at [phone], or you may call the cardiac cath lab at [facility] directly at [phone]   Follow-Up: Your physician recommends that you schedule a follow-up appointment two weeks post cath with Dr. Caryl Comes   Any Other Special Instructions Will Be Listed Below (If Applicable).     If you need a refill on your cardiac medications before your next appointment, please call your pharmacy.

## 2014-12-19 NOTE — Assessment & Plan Note (Signed)
Would like to coronary angiography with left ventricular hemodynamics but no LV gram. He will need gentle rehydration precath.

## 2014-12-19 NOTE — Assessment & Plan Note (Signed)
Now on stable dose of standing Lasix with minimal use of sliding scale. As his blood pressure is elevated today, we can titrate up his Entresto to the Mid-level dosing. (49/51 mg). Only able to tolerate twice a day of carvedilol due to bradycardia. May be by reducing amiodarone, we will begin to increase it back to 12 twice a day.

## 2014-12-19 NOTE — Assessment & Plan Note (Signed)
This is a long-standing condition. He did a negative Myoview, there was some EKG changes noted but not any evidence of ischemia. This was many years ago and he has not had an ischemic evaluation since. He did have some chest discomfort with his atrial flutter, but has not had anymore now. With plans to refer back to electrophysiology for ICD placement, plan would be to proceed with diagnostic left heart catheterization to exclude ischemic component. As he is not having active heart failure symptoms, I will opt out of right heart catheterization.  I described the procedure with the risk and benefits as well as alternatives to the patient in detail. He agreed to proceed.  Risks / Complications include, but not limited to: Death, MI, CVA/TIA, VF/VT (with defibrillation), Bradycardia (need for temporary pacer placement), contrast induced nephropathy -- notably increased with him due to increased Cr level with baseline CKD-III; bleeding / bruising / hematoma / pseudoaneurysm, vascular or coronary injury (with possible emergent CT or Vascular Surgery), adverse medication reactions, infection.  Additional risks involving the use of radiation with the possibility of radiation burns and cancer were explained in detail.  He will likely require prolonged precath hydration pending recheck of his lab values to ensure that his creatinine is stable.

## 2014-12-19 NOTE — Progress Notes (Signed)
PATIENT: Hector [name] MRN: [id number] DOB: 12/27/1965 PCP: [name], NP  Clinic Note: Chief Complaint  Patient presents with  . other    3 month follow up. Meds reviewed by the patient verbally. "doing well."   . Cardiomyopathy    HPI: Hector [name] is a 49 y.o. male with a PMH below who presents today for cardiology followup for her heart failure (combined systolic and diastolic) in the context of atrial flutter/fibrillation. 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 Apparently this improved after he quit drinking alcohol.. I saw him in February 2016. He was actually admitted to the hospital at [facility] June 21 with atrial flutter with rapid response. He was cardioverted(TEE/DCCV), then reverted to atrial fibrillation and was started on amiodarone as well as Eliquis. He was started on chest low as well as carvedilol and diuretics.  He saw [name] from electrophysiology in the clinic on June 28 to determine further treatment options for atrial fibrillation/flutter as well as consider possibility of ICD referral.  Dr. [name] recommendation was to continue the amiodarone as he would not likely be a good candidate for ablative procedures to be discussed consider the possibility of cardiac catheterization to exclude an ischemic component of cardiomyopathy. And also agreed that he would be an appropriate candidate for ICD.  Studies Reviewed:   2D Echo 1/13/'16:   Severely reduced EF of 20-25%. Elevated LAP, impaired LV relaxation (GR 1 DD) mild-moderate LV dilation with severe LA dilation. Mild-mild MR and TR.  TEE/DCCV 6/23/'16:  Study Conclusions  - Left ventricle: Systolic function was severely reduced. The estimated ejection fraction was in the range of 20% to 25%. Diffuse hypokinesis. The study is not technically sufficient to allow evaluation of  LV diastolic function. No evidence of thrombus. - Mitral valve: There was mild regurgitation. - Left atrium: No evidence of thrombus in the atrial cavity or appendage. No evidence of thrombus in the appendage. The appendage was moderately to severely dilated. Emptying velocity was normal. - Right ventricle: The cavity size was moderately dilated. Systolic function was moderately to severely reduced. - Right atrium: The atrium was mildly dilated. - Atrial septum: No defect or patent foramen ovale was identified.  Impressions: - Successful cardioversion. No cardiac source of emboli was indentified in the LA, LA appendage or LV.  Interval History:  Hector [name] presents today doing quite well from a cardiac standpoint. He has no major complaints.  He has not had any further episodes of rapid irregular heartbeats since his cardioversion. He denies any PND, orthopnea or edema to suggest recurrent heart failure. He has less dizziness now. He does not do routine exercise, but is active. No myalgias or arthralgias on high-dose Lipitor.  The remainder of cardiac review of systems is as follows: Cardiovascular ROS: no chest pain or dyspnea on exertion negative for - loss of consciousness, orthopnea, palpitations, paroxysmal nocturnal dyspnea, rapid heart rate, shortness of breath or TIA/amaurosis fugax, syncope/near syncope :   Past Medical History  Diagnosis Date  . Hyperlipidemia   . Elevated troponin     Chronically elevated. - level was 0.17-0.10 during recent admission  . 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 No inducible ischemia, fixed inferoapical defect = Infarct vs.artifact (not commensurate with extent of reduced EF); b) Echo 8/'12: Mild LV dilation, Mod-Severe global hypokinesis - EF  25-35%. Mild-moderate RV dilation no significant bowel lesion mild-moderate MR; c) Echo Jan 2016: EF 20-25%, high LAP, mild-moderate LV dilation,  severe L  . Chronic combined systolic and diastolic heart failure, NYHA class 2 ([place])     a. see above  . Alcohol abuse   . Essential hypertension   . CKD (chronic kidney disease), stage III   . GI bleed 11/2013  . Myocardial infarct ([place]) 2004  . Pancreatitis   . Atrial flutter ([place])     a. new onset 07/2014; b. s/p unsuccessful TEE/DCCV 08/16/2014; c. on apixaban   . Atrial fibrillation ([place])     a. new onset s/p unsuccessful TEE/DCCV on 08/16/2014; b. on apixaban   . Sleep-disordered breathing     Has yet to have a sleep study    Prior Cardiac Evaluation and Past Surgical History: Past Surgical History  Procedure Laterality Date  . Knee surgery Right   . Nm myoview ltd  November 2011    No ischemia or infarction. EF 50-55% ( no improvement from 2009 Myoview EF of 28%  . Tee without cardioversion N/A 08/16/2014    Procedure: TRANSESOPHAGEAL ECHOCARDIOGRAM (TEE);  Surgeon: [name], MD;  Location: [facility] ORS;  Service: Cardiovascular;  Laterality: N/A;  . Electrophysiologic study N/A 08/16/2014    Procedure: CARDIOVERSION;  Surgeon: [name], MD;  Location: [facility] ORS;  Service: Cardiovascular;  Laterality: N/A;    Allergies  Allergen Reactions  . Eggs Or Egg-Derived Products Rash    Current Outpatient Prescriptions  Medication Sig Dispense Refill  . amiodarone (PACERONE) 200 MG tablet Take 1 tablet (200 mg total) by mouth daily. 60 tablet 3  . apixaban (ELIQUIS) 5 MG TABS tablet Take 1 tablet (5 mg total) by mouth 2 (two) times daily. 60 tablet 5  . atorvastatin (LIPITOR) 80 MG tablet Take 1 tablet (80 mg total) by mouth daily. 30 tablet 5  . carvedilol (COREG) 12.5 MG tablet Take 1 tablet (12.5 mg total) by mouth 2 (two) times daily. 30 tablet 3  . furosemide (LASIX) 40 MG tablet Take 1 tablet (40 mg total) by mouth daily. 30 tablet 5  . omeprazole (PRILOSEC) 20 MG capsule Take 1 capsule (20 mg total) by mouth daily. 30 capsule 5  . sacubitril-valsartan (ENTRESTO)  49-51 MG Take 1 tablet by mouth 2 (two) times daily. 60 tablet 3   No current facility-administered medications for this visit.   Social History   Social History  . Marital Status: Single    Spouse Name: N/A  . Number of Children: N/A  . Years of Education: N/A   Occupational History  . Not on file.   Social History Main Topics  . Smoking status: Former Smoker -- 1.00 packs/day for 12 years    Quit date: 02/23/1998  . Smokeless tobacco: Never Used  . Alcohol Use: No     Comment: 4 heavy alcohol drinker. Quit 2 years ago.  . Drug Use: No  . Sexual Activity: Not on file   Other Topics Concern  . Not on file   Social History Narrative    family history includes Diabetes in his mother; Hyperlipidemia in his mother; Hypertension in his mother.  ROS: A comprehensive Review of Systems - was performed Review of Systems  Constitutional: Positive for malaise/fatigue.  HENT: Negative for nosebleeds.   Eyes: Negative for blurred vision.  Respiratory: Positive for shortness of breath (Only with significant exertion) and wheezing (Occasional).   Cardiovascular: Negative for claudication.  Gastrointestinal: Negative for heartburn, blood in stool and melena.  Genitourinary: Negative for hematuria.  Musculoskeletal: Positive for joint pain (Bilateral knee).  Neurological: Positive for dizziness.  Endo/Heme/Allergies: Does not bruise/bleed easily.  Psychiatric/Behavioral: Negative.   All other systems reviewed and are negative.  Wt Readings from Last 3 Encounters:  12/19/14 287 lb 8 oz (130.409 kg)  10/02/14 281 lb (127.461 kg)  09/19/14 278 lb 8 oz (126.327 kg)    PHYSICAL EXAM BP 150/90 mmHg  Pulse 49  Ht 5\' 11"  (1.803 m)  Wt 287 lb 8 oz (130.409 kg)  BMI 40.12 kg/m2 General appearance: alert, cooperative, appears stated age, no distress, moderately obese; pleasant mood and affect. Neck: JVD - 2 cm above sternal notch, no adenopathy, no carotid bruit, supple, symmetrical,  trachea midline and thyroid not enlarged, symmetric, no tenderness/mass/nodules Lungs: normal percussion bilaterally and Diffuse interstitial sounds, no rhonchi or rales. Nonlabored. Heart: RR - bradycardic, normal S1, S2 with positive S4. Slightly lateral PMI, but nonsustained; 2/6 HSM at apex. High pitched blowing. Abdomen: soft, non-tender; bowel sounds normal; no masses,  no organomegaly and Obese Extremities: Trace lower extremity edema. No clubbing cyanosis Pulses: 2+ and symmetric Neurologic: Mental status: Alert, oriented, thought content appropriate, affect: normal Cranial nerves: normal   Adult ECG Report  Rate: 49;  Rhythm: sinus bradycardia  QRS Axis: -24 borderline LAD ;  PR Interval: 188;  QRS Duration: 108; QTc: 485,  Voltages: normal; nonspecific T wave changes with T wave inversions in 1 and aVL. Poor progression in precordial leads nonspecific.    Narrative Interpretation: No significant change from last EKG.  Recent Labs:  Lab Results  Component Value Date   CREATININE 2.07* 08/18/2014   Lab Results  Component Value Date   K 4.0 08/14/2014   Lab Results  Component Value Date   WBC 9.2 08/14/2014   HGB 13.9 08/14/2014   HCT 44.0 08/14/2014   MCV 80.3 08/14/2014   PLT 228 08/14/2014    ASSESSMENT / PLAN: Pleasant gentleman with long-standing cardiomyopathy.  Problem List Items Addressed This Visit    Paroxysmal atrial flutter ([place]) - Primary (Chronic)    Status post cardioversion with TEE guidance. Remains on amiodarone plus beta blocker. We can reduce the amiodarone dosing to once daily. Hopefully this will potentially allow [place] to increase the carvedilol back.      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 He did a negative Myoview, there was some EKG changes noted but  not any evidence of ischemia. This was many years ago and he has not had an ischemic evaluation since. He did have some chest discomfort with his atrial flutter, but has not had anymore now. With plans to refer back to electrophysiology for ICD placement, plan would be to proceed with diagnostic left heart catheterization to exclude ischemic component. As he is not having active heart failure symptoms, I will opt out of right heart catheterization.  I described the procedure with the risk and benefits as well as alternatives to the patient in detail. He agreed to proceed.  Risks / Complications include, but not limited to: Death, MI, CVA/TIA, VF/VT (with defibrillation), Bradycardia (need for temporary pacer placement), contrast induced nephropathy -- notably increased with him due to increased Cr level with baseline CKD-III; bleeding / bruising / hematoma /  pseudoaneurysm, vascular or coronary injury (with possible emergent CT or Vascular Surgery), adverse medication reactions, infection.  Additional risks involving the use of radiation with the possibility of radiation burns and cancer were explained in detail.  He will likely require prolonged precath hydration pending recheck of his lab values to ensure that his creatinine is stable.       Relevant Medications   sacubitril-valsartan (ENTRESTO) 49-51 MG   amiodarone (PACERONE) 200 MG tablet   Hyperlipidemia (Chronic)    On high-dose statin. We can check fasting lipid panel when he comes in for his procedure, since he will be nothing by mouth.      Relevant Medications   sacubitril-valsartan (ENTRESTO) 49-51 MG   amiodarone (PACERONE) 200 MG tablet   Essential hypertension (Chronic)    Not is well-controlled today is was before. He is now off clonidine which will allow [place] to increase Entresto dose to 49/51 mg.      Relevant Medications   sacubitril-valsartan (ENTRESTO) 49-51 MG   amiodarone (PACERONE) 200 MG tablet   Chronic kidney  disease (CKD), stage III (moderate) (Chronic)    Would like to coronary angiography with left ventricular hemodynamics but no LV gram. He will need gentle rehydration precath.      Chronic combined systolic and diastolic heart failure, NYHA class 2 (HCC) (Chronic)    Now on stable dose of standing Lasix with minimal use of sliding scale. As his blood pressure is elevated today, we can titrate up his Entresto to the Mid-level dosing. (49/51 mg). Only able to tolerate twice a day of carvedilol due to bradycardia. May be by reducing amiodarone, we will begin to increase it back to 12 twice a day.      Relevant Medications   sacubitril-valsartan (ENTRESTO) 49-51 MG   amiodarone (PACERONE) 200 MG tablet   Other Relevant Orders   EKG 12-Lead    Other Visit Diagnoses    Pre-procedure lab exam        Relevant Orders    Basic Metabolic Panel (BMET)    CBC    INR/PT       Meds ordered this encounter  Medications  . sacubitril-valsartan (ENTRESTO) 49-51 MG    Sig: Take 1 tablet by mouth 2 (two) times daily.    Dispense:  60 tablet    Refill:  3  . amiodarone (PACERONE) 200 MG tablet    Sig: Take 1 tablet (200 mg total) by mouth daily.    Dispense:  60 tablet    Refill:  3    Followup: With me post cath in 3-6 months, we will schedule him to see Dr. Caryl Comes back to address ICD post cath.   Ceferino Lang W. Ellyn Hack, M.D., M.S. Interventional Cardiolgy [facility]

## 2014-12-19 NOTE — Assessment & Plan Note (Signed)
Status post cardioversion with TEE guidance. Remains on amiodarone plus beta blocker. We can reduce the amiodarone dosing to once daily. Hopefully this will potentially allow [place] to increase the carvedilol back.

## 2014-12-19 NOTE — Assessment & Plan Note (Signed)
Not is well-controlled today is was before. He is now off clonidine which will allow [place] to increase Entresto dose to 49/51 mg.

## 2014-12-20 ENCOUNTER — Other Ambulatory Visit: Payer: Self-pay

## 2014-12-20 ENCOUNTER — Telehealth: Payer: Self-pay

## 2014-12-20 DIAGNOSIS — E785 Hyperlipidemia, unspecified: Secondary | ICD-10-CM

## 2014-12-20 NOTE — Telephone Encounter (Signed)
Attempted to call [employer] for new PA on Entresto due to increased dose. I was on hold for 15 minutes and could not wait any longer.  Will complete this tomorrow.

## 2014-12-20 NOTE — Telephone Encounter (Signed)
Pietro Cassis B >','<< Less Detail',event)" href="javascript:;">More Detail >>   [name], MD   Sent: Wed December 19, 2014 5:38 PM    To: Georgiana Shore, RN        Message     Ivin Booty,     I forgot that I wanted him to have his lipids checked the morning of his cath.    He will also need to hold his ELIQUIS for 3 doses prior to the morning of his cath.        HARDING, DAVID W, MD    S/w pt regarding holding Eliquis for 3 doses before cath and  not 2 days as indicated on his AVS. Pt verbalized understanding w/no questions.

## 2014-12-20 NOTE — Telephone Encounter (Signed)
Pt requiring PA for Entresto.

## 2014-12-21 ENCOUNTER — Telehealth: Payer: Self-pay

## 2014-12-21 NOTE — Telephone Encounter (Signed)
Spoke with pharmacist at [facility] today. Hector [name] [id number] must go to a review board before they will approve it.  Should not take more than 24-48 hours. PA - [id number]. [place] notified.

## 2015-01-02 ENCOUNTER — Ambulatory Visit: Payer: Medicaid Other | Attending: Family | Admitting: Family

## 2015-01-02 ENCOUNTER — Encounter: Payer: Self-pay | Admitting: Family

## 2015-01-02 ENCOUNTER — Other Ambulatory Visit
Admission: RE | Admit: 2015-01-02 | Discharge: 2015-01-02 | Disposition: A | Discharge status: Home / Self Care | Payer: Medicaid Other | Source: Ambulatory Visit | Attending: Cardiology | Admitting: Cardiology

## 2015-01-02 VITALS — BP 146/100 | HR 45 | Resp 20 | Ht 71.0 in | Wt 288.0 lb

## 2015-01-02 DIAGNOSIS — I426 Alcoholic cardiomyopathy: Secondary | ICD-10-CM | POA: Insufficient documentation

## 2015-01-02 DIAGNOSIS — I10 Essential (primary) hypertension: Secondary | ICD-10-CM

## 2015-01-02 DIAGNOSIS — Z01812 Encounter for preprocedural laboratory examination: Secondary | ICD-10-CM | POA: Insufficient documentation

## 2015-01-02 DIAGNOSIS — Z79899 Other long term (current) drug therapy: Secondary | ICD-10-CM | POA: Insufficient documentation

## 2015-01-02 DIAGNOSIS — Z87891 Personal history of nicotine dependence: Secondary | ICD-10-CM | POA: Diagnosis not present

## 2015-01-02 DIAGNOSIS — F1010 Alcohol abuse, uncomplicated: Secondary | ICD-10-CM | POA: Insufficient documentation

## 2015-01-02 DIAGNOSIS — I4891 Unspecified atrial fibrillation: Secondary | ICD-10-CM | POA: Diagnosis not present

## 2015-01-02 DIAGNOSIS — Z7901 Long term (current) use of anticoagulants: Secondary | ICD-10-CM | POA: Insufficient documentation

## 2015-01-02 DIAGNOSIS — E785 Hyperlipidemia, unspecified: Secondary | ICD-10-CM | POA: Diagnosis not present

## 2015-01-02 DIAGNOSIS — I480 Paroxysmal atrial fibrillation: Secondary | ICD-10-CM

## 2015-01-02 DIAGNOSIS — R001 Bradycardia, unspecified: Secondary | ICD-10-CM | POA: Diagnosis not present

## 2015-01-02 DIAGNOSIS — I5022 Chronic systolic (congestive) heart failure: Secondary | ICD-10-CM

## 2015-01-02 DIAGNOSIS — N183 Chronic kidney disease, stage 3 (moderate): Secondary | ICD-10-CM | POA: Diagnosis not present

## 2015-01-02 DIAGNOSIS — I252 Old myocardial infarction: Secondary | ICD-10-CM | POA: Insufficient documentation

## 2015-01-02 DIAGNOSIS — I129 Hypertensive chronic kidney disease with stage 1 through stage 4 chronic kidney disease, or unspecified chronic kidney disease: Secondary | ICD-10-CM | POA: Diagnosis not present

## 2015-01-02 LAB — BASIC METABOLIC PANEL
ANION GAP: 3 — AB (ref 5–15)
BUN: 26 mg/dL — AB (ref 6–20)
CHLORIDE: 111 mmol/L (ref 101–111)
CO2: 26 mmol/L (ref 22–32)
Calcium: 8.4 mg/dL — ABNORMAL LOW (ref 8.9–10.3)
Creatinine, Ser: 2.33 mg/dL — ABNORMAL HIGH (ref 0.61–1.24)
GFR, EST AFRICAN AMERICAN: 36 mL/min — AB (ref >60)
GFR, EST NON AFRICAN AMERICAN: 31 mL/min — AB (ref >60)
Glucose, Bld: 125 mg/dL — ABNORMAL HIGH (ref 65–99)
POTASSIUM: 4.6 mmol/L (ref 3.5–5.1)
SODIUM: 140 mmol/L (ref 135–145)

## 2015-01-02 LAB — CBC
HEMATOCRIT: 46.1 % (ref 40.0–52.0)
HEMOGLOBIN: 15 g/dL (ref 13.0–18.0)
MCH: 28.2 pg (ref 26.0–34.0)
MCHC: 32.5 g/dL (ref 32.0–36.0)
MCV: 86.9 fL (ref 80.0–100.0)
Platelets: 173 10*3/uL (ref 150–440)
RBC: 5.3 MIL/uL (ref 4.40–5.90)
RDW: 17.8 % — AB (ref 11.5–14.5)
WBC: 6.2 10*3/uL (ref 3.8–10.6)

## 2015-01-02 LAB — PROTIME-INR
INR: 1.23
Prothrombin Time: 15.7 seconds — ABNORMAL HIGH (ref 11.4–15.0)

## 2015-01-02 NOTE — Progress Notes (Signed)
Subjective:    [name], male    DOB: Apr 18, 1965, 49 y.o.   MRN: [id number]  Congestive Heart Failure Presents for follow-up visit. The disease course has been stable. Pertinent negatives include no abdominal pain, chest pain, chest pressure, edema, fatigue, orthopnea, palpitations or shortness of breath. The symptoms have been stable. Past treatments include angiotensin receptor blockers, beta blockers and salt and fluid restriction. The treatment provided significant relief. Compliance with prior treatments has been good. His past medical history is significant for arrhythmia, CAD and HTN. Compliance with total regimen is 76-100%.  Hypertension This is a chronic problem. The current episode started more than 1 year ago. The problem has been waxing and waning since onset. Associated symptoms include anxiety (about the catheterization). Pertinent negatives include no chest pain, headaches, malaise/fatigue, palpitations or shortness of breath. There are no associated agents to hypertension. Risk factors for coronary artery disease include dyslipidemia, male gender, obesity and family history. Past treatments include beta blockers, ACE inhibitors, angiotensin blockers, diuretics and lifestyle changes. The current treatment provides moderate improvement. Hypertensive end-organ damage includes kidney disease and heart failure.    Past Medical History  Diagnosis Date  . Hyperlipidemia   . Elevated troponin     Chronically elevated. - level was 0.17-0.10 during recent admission  . 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 No inducible ischemia, fixed inferoapical defect = Infarct vs.artifact (not commensurate with extent of reduced EF); b) Echo 8/'12: Mild LV dilation, Mod-Severe global hypokinesis - EF 25-35%. Mild-moderate RV dilation no significant bowel lesion mild-moderate MR; c) Echo Jan 2016: EF 20-25%, high LAP,  mild-moderate LV dilation, severe L  . Chronic combined systolic and diastolic heart failure, NYHA class 2 ([place])     a. see above  . Alcohol abuse   . Essential hypertension   . CKD (chronic kidney disease), stage III   . GI bleed 11/2013  . Myocardial infarct ([place]) 2004  . Pancreatitis   . Atrial flutter ([place])     a. new onset 07/2014; b. s/p unsuccessful TEE/DCCV 08/16/2014; c. on apixaban   . Atrial fibrillation ([place])     a. new onset s/p unsuccessful TEE/DCCV on 08/16/2014; b. on apixaban   . Sleep-disordered breathing     Has yet to have a sleep study    Past Surgical History  Procedure Laterality Date  . Knee surgery Right   . Nm myoview ltd  November 2011    No ischemia or infarction. EF 50-55% ( no improvement from 2009 Myoview EF of 28%  . Tee without cardioversion N/A 08/16/2014    Procedure: TRANSESOPHAGEAL ECHOCARDIOGRAM (TEE);  Surgeon: [name], MD;  Location: [facility] ORS;  Service: Cardiovascular;  Laterality: N/A;  . Electrophysiologic study N/A 08/16/2014    Procedure: CARDIOVERSION;  Surgeon: [name], MD;  Location: [facility] ORS;  Service: Cardiovascular;  Laterality: N/A;    Family History  Problem Relation Age of Onset  . Hypertension Mother   . Hyperlipidemia Mother   . Diabetes Mother     Social History  Substance Use Topics  . Smoking status: Former Smoker -- 1.00 packs/day for 12 years    Quit date: 02/23/1998  . Smokeless tobacco: Never Used  . Alcohol Use: No     Comment: 4 heavy alcohol drinker. Quit 2 years ago.    Allergies  Allergen Reactions  . Eggs Or Egg-Derived Products Rash    Prior  to Admission medications   Medication Sig Start Date End Date Taking? Authorizing Provider  amiodarone (PACERONE) 200 MG tablet Take 1 tablet (200 mg total) by mouth daily. 12/19/14  Yes [name], MD  apixaban (ELIQUIS) 5 MG TABS tablet Take 1 tablet (5 mg total) by mouth 2 (two) times daily. 09/12/14  Yes Alisa Graff, FNP  atorvastatin  (LIPITOR) 80 MG tablet Take 1 tablet (80 mg total) by mouth daily. 09/12/14  Yes Alisa Graff, FNP  carvedilol (COREG) 12.5 MG tablet Take 1 tablet (12.5 mg total) by mouth 2 (two) times daily. 09/19/14  Yes [name], MD  furosemide (LASIX) 40 MG tablet Take 1 tablet (40 mg total) by mouth daily. 09/12/14  Yes Alisa Graff, FNP  omeprazole (PRILOSEC) 20 MG capsule Take 1 capsule (20 mg total) by mouth daily. 09/12/14  Yes Alisa Graff, FNP  sacubitril-valsartan (ENTRESTO) 49-51 MG Take 1 tablet by mouth 2 (two) times daily. 12/19/14  Yes [name], MD     Review of Systems  Constitutional: Negative for malaise/fatigue, appetite change and fatigue.  HENT: Negative for congestion, postnasal drip and sore throat.   Eyes: Negative.   Respiratory: Negative for cough, chest tightness and shortness of breath.   Cardiovascular: Negative for chest pain, palpitations and leg swelling.  Gastrointestinal: Negative for abdominal pain and abdominal distention.  Endocrine: Negative.   Genitourinary: Negative.   Musculoskeletal: Positive for arthralgias (right knee). Negative for back pain.  Skin: Negative.   Allergic/Immunologic: Negative.   Neurological: Negative for dizziness, light-headedness and headaches.  Hematological: Negative for adenopathy. Does not bruise/bleed easily.  Psychiatric/Behavioral: Negative for sleep disturbance (sleeping on 2 pillows) and dysphoric mood. The patient is not nervous/anxious.        Objective:   Physical Exam  Constitutional: He is oriented to person, place, and time. He appears well-developed and well-nourished.  HENT:  Head: Normocephalic and atraumatic.  Eyes: Conjunctivae are normal. Pupils are equal, round, and reactive to light.  Neck: Normal range of motion. Neck supple.  Cardiovascular: Regular rhythm.  Bradycardia present.   Pulmonary/Chest: Effort normal. He has no wheezes. He has no rales.  Abdominal: Soft. He exhibits no distension.  There is no tenderness.  Musculoskeletal: He exhibits no edema or tenderness.  Neurological: He is alert and oriented to person, place, and time.  Skin: Skin is warm and dry.  Psychiatric: He has a normal mood and affect. His behavior is normal. Thought content normal.  Nursing note and vitals reviewed.   BP 146/100 mmHg  Pulse 45  Resp 20  Ht 5\' 11"  (1.803 m)  Wt 288 lb (130.636 kg)  BMI 40.19 kg/m2  SpO2 97%      Assessment & Plan:  1: Chronic heart failure with reduced ejection fraction- Patient presents currently symptom free at this time. Denies getting fatigued or short of breath with any exertion. No swelling in his legs. He says that his home weight has been gradually going up but he admits to not getting any exercise since it's been "getting cold outside". By our scale, he has gained 7 pounds since he was last here August 2016. Encouraged him to increase his activity level as he's gained a total of 24 pounds since May 2016. He says that he's not adding any salt to his food and is trying to follow a low sodium diet. His entresto has been increased to the 49/51mg  dose twice daily by his cardiologist and he's tolerated  that without known side effects. He is currently scheduled for a cardiac catheterization on 01/09/15 with possible discussion of AICD. 2: HTN- Blood pressure a little elevated but he admits that he's a little anxious about his procedure next week. Certainly the extra weight could be contributing to the blood pressure. Continue to monitor and adjust medications accordingly. 3: Atrial fibrillation- Patient is bradycardic today and he's had his amiodarone decreased to once daily. Follows closely with cardiology regarding this. Denies any fatigue at this time.  Return in 3 months or sooner for any questions/problems before then.

## 2015-01-02 NOTE — Patient Instructions (Signed)
Continue weighing daily and call for an overnight weight gain of > 2 pounds or a weekly weight gain of >5 pounds. 

## 2015-01-08 ENCOUNTER — Other Ambulatory Visit: Payer: Self-pay | Admitting: *Deleted

## 2015-01-08 ENCOUNTER — Other Ambulatory Visit: Payer: Self-pay

## 2015-01-08 ENCOUNTER — Telehealth: Payer: Self-pay

## 2015-01-08 DIAGNOSIS — Z01818 Encounter for other preprocedural examination: Secondary | ICD-10-CM

## 2015-01-08 DIAGNOSIS — R799 Abnormal finding of blood chemistry, unspecified: Secondary | ICD-10-CM

## 2015-01-08 DIAGNOSIS — R7989 Other specified abnormal findings of blood chemistry: Secondary | ICD-10-CM

## 2015-01-08 MED ORDER — SODIUM CHLORIDE 0.9 % IV SOLN: INTRAVENOUS | Status: DC

## 2015-01-08 NOTE — Telephone Encounter (Signed)
Changed cath time with Pamala Hurry in scheduling to 10:30am S/w Angie in [place] regarding pre-cath hydration. Per her recommendation, pt to arrive at 8:30am Dr. Ellyn Hack aware of need to place orders.  S/w pt regarding time change for cardiac cath.  Pt understands to arrive at [facility], 8:30am for pre-cath hydration. Reviewed instructions including medications to hold. Confirmed f/u appt w/Dr. Caryl Comes  Awaiting hydration orders to be placed by Dr. Ellyn Hack.

## 2015-01-09 ENCOUNTER — Ambulatory Visit
Admission: RE | Admit: 2015-01-09 | Discharge: 2015-01-09 | Disposition: A | Discharge status: Home / Self Care | Payer: Medicaid Other | Source: Ambulatory Visit | Attending: Cardiology | Admitting: Cardiology

## 2015-01-09 ENCOUNTER — Encounter: Payer: Self-pay | Admitting: *Deleted

## 2015-01-09 ENCOUNTER — Encounter
Admission: RE | Disposition: A | Discharge status: Home / Self Care | Payer: Self-pay | Source: Ambulatory Visit | Attending: Cardiology

## 2015-01-09 ENCOUNTER — Encounter: Payer: Self-pay | Admitting: Certified Registered Nurse Anesthetist

## 2015-01-09 DIAGNOSIS — I5042 Chronic combined systolic (congestive) and diastolic (congestive) heart failure: Secondary | ICD-10-CM | POA: Diagnosis not present

## 2015-01-09 DIAGNOSIS — I252 Old myocardial infarction: Secondary | ICD-10-CM | POA: Insufficient documentation

## 2015-01-09 DIAGNOSIS — I130 Hypertensive heart and chronic kidney disease with heart failure and stage 1 through stage 4 chronic kidney disease, or unspecified chronic kidney disease: Secondary | ICD-10-CM | POA: Diagnosis not present

## 2015-01-09 DIAGNOSIS — I4892 Unspecified atrial flutter: Secondary | ICD-10-CM | POA: Diagnosis not present

## 2015-01-09 DIAGNOSIS — Z01818 Encounter for other preprocedural examination: Secondary | ICD-10-CM

## 2015-01-09 DIAGNOSIS — Z79899 Other long term (current) drug therapy: Secondary | ICD-10-CM | POA: Insufficient documentation

## 2015-01-09 DIAGNOSIS — Z87891 Personal history of nicotine dependence: Secondary | ICD-10-CM | POA: Diagnosis not present

## 2015-01-09 DIAGNOSIS — Z91012 Allergy to eggs: Secondary | ICD-10-CM | POA: Diagnosis not present

## 2015-01-09 DIAGNOSIS — Z7901 Long term (current) use of anticoagulants: Secondary | ICD-10-CM | POA: Insufficient documentation

## 2015-01-09 DIAGNOSIS — E785 Hyperlipidemia, unspecified: Secondary | ICD-10-CM | POA: Insufficient documentation

## 2015-01-09 DIAGNOSIS — I4891 Unspecified atrial fibrillation: Secondary | ICD-10-CM | POA: Insufficient documentation

## 2015-01-09 DIAGNOSIS — N183 Chronic kidney disease, stage 3 unspecified: Secondary | ICD-10-CM

## 2015-01-09 DIAGNOSIS — I10 Essential (primary) hypertension: Secondary | ICD-10-CM

## 2015-01-09 DIAGNOSIS — R0609 Other forms of dyspnea: Secondary | ICD-10-CM | POA: Insufficient documentation

## 2015-01-09 DIAGNOSIS — I426 Alcoholic cardiomyopathy: Secondary | ICD-10-CM | POA: Diagnosis not present

## 2015-01-09 DIAGNOSIS — R799 Abnormal finding of blood chemistry, unspecified: Secondary | ICD-10-CM

## 2015-01-09 DIAGNOSIS — I428 Other cardiomyopathies: Secondary | ICD-10-CM

## 2015-01-09 DIAGNOSIS — I420 Dilated cardiomyopathy: Secondary | ICD-10-CM | POA: Diagnosis not present

## 2015-01-09 DIAGNOSIS — R001 Bradycardia, unspecified: Secondary | ICD-10-CM | POA: Insufficient documentation

## 2015-01-09 DIAGNOSIS — G4730 Sleep apnea, unspecified: Secondary | ICD-10-CM | POA: Diagnosis not present

## 2015-01-09 DIAGNOSIS — I480 Paroxysmal atrial fibrillation: Secondary | ICD-10-CM

## 2015-01-09 DIAGNOSIS — I429 Cardiomyopathy, unspecified: Secondary | ICD-10-CM

## 2015-01-09 DIAGNOSIS — R7989 Other specified abnormal findings of blood chemistry: Secondary | ICD-10-CM

## 2015-01-09 DIAGNOSIS — R0602 Shortness of breath: Secondary | ICD-10-CM | POA: Diagnosis present

## 2015-01-09 HISTORY — PX: CARDIAC CATHETERIZATION: SHX172

## 2015-01-09 SURGERY — LEFT HEART CATH
Anesthesia: Moderate Sedation

## 2015-01-09 MED ORDER — ONDANSETRON HCL 4 MG/2ML IJ SOLN: 4.0000 mg | Freq: Four times a day (QID) | INTRAMUSCULAR | Status: DC | PRN

## 2015-01-09 MED ORDER — HEPARIN SODIUM (PORCINE) 1000 UNIT/ML IJ SOLN
INTRAMUSCULAR | Status: DC | PRN
  Administered 2015-01-09: 6000 [IU] via INTRAVENOUS

## 2015-01-09 MED ORDER — SODIUM CHLORIDE 0.9 % IJ SOLN
3.0000 mL | Freq: Two times a day (BID) | INTRAMUSCULAR | Status: DC
  Administered 2015-01-09: 3 mL via INTRAVENOUS

## 2015-01-09 MED ORDER — HEPARIN SODIUM (PORCINE) 1000 UNIT/ML IJ SOLN
INTRAMUSCULAR | Status: AC
  Filled 2015-01-09: qty 1

## 2015-01-09 MED ORDER — SODIUM CHLORIDE 0.9 % IV SOLN: 250.0000 mL | INTRAVENOUS | Status: DC | PRN

## 2015-01-09 MED ORDER — FENTANYL CITRATE (PF) 100 MCG/2ML IJ SOLN
INTRAMUSCULAR | Status: AC
  Filled 2015-01-09: qty 2

## 2015-01-09 MED ORDER — ACETAMINOPHEN 325 MG PO TABS: 650.0000 mg | ORAL_TABLET | ORAL | Status: DC | PRN

## 2015-01-09 MED ORDER — SODIUM CHLORIDE 0.9 % IV SOLN
INTRAVENOUS | Status: DC
  Administered 2015-01-09: 09:00:00 via INTRAVENOUS

## 2015-01-09 MED ORDER — HEPARIN (PORCINE) IN NACL 2-0.9 UNIT/ML-% IJ SOLN
INTRAMUSCULAR | Status: AC
  Filled 2015-01-09: qty 1000

## 2015-01-09 MED ORDER — VERAPAMIL HCL 2.5 MG/ML IV SOLN
INTRAVENOUS | Status: AC
  Filled 2015-01-09: qty 2

## 2015-01-09 MED ORDER — SODIUM CHLORIDE 0.9 % IJ SOLN: 3.0000 mL | Freq: Two times a day (BID) | INTRAMUSCULAR | Status: DC

## 2015-01-09 MED ORDER — MIDAZOLAM HCL 2 MG/2ML IJ SOLN
INTRAMUSCULAR | Status: AC
Start: 2015-01-09 — End: 2015-01-09
  Filled 2015-01-09: qty 2

## 2015-01-09 MED ORDER — SODIUM CHLORIDE 0.9 % IJ SOLN: 3.0000 mL | INTRAMUSCULAR | Status: DC | PRN

## 2015-01-09 MED ORDER — VERAPAMIL HCL 2.5 MG/ML IV SOLN
INTRAVENOUS | Status: DC | PRN
  Administered 2015-01-09: 1.25 mg via INTRA_ARTERIAL

## 2015-01-09 MED ORDER — MORPHINE SULFATE (PF) 2 MG/ML IV SOLN: 2.0000 mg | INTRAVENOUS | Status: DC | PRN

## 2015-01-09 MED ORDER — MIDAZOLAM HCL 2 MG/2ML IJ SOLN
INTRAMUSCULAR | Status: DC | PRN
  Administered 2015-01-09 (×2): 1 mg via INTRAVENOUS

## 2015-01-09 MED ORDER — SODIUM CHLORIDE 0.9 % WEIGHT BASED INFUSION: 3.0000 mL/kg/h | INTRAVENOUS | Status: DC

## 2015-01-09 MED ORDER — FENTANYL CITRATE (PF) 100 MCG/2ML IJ SOLN
INTRAMUSCULAR | Status: DC | PRN
  Administered 2015-01-09: 25 ug via INTRAVENOUS

## 2015-01-09 MED ORDER — IOHEXOL 300 MG/ML  SOLN
INTRAMUSCULAR | Status: DC | PRN
  Administered 2015-01-09: 60 mL via INTRA_ARTERIAL

## 2015-01-09 SURGICAL SUPPLY — 6 items
CATH 5F 110X4 TIG (CATHETERS) ×3 IMPLANT
DEVICE RAD COMP TR BAND LRG (VASCULAR PRODUCTS) ×3 IMPLANT
GLIDESHEATH SLEND A-KIT 6F 22G (SHEATH) ×3 IMPLANT
KIT MANI 3VAL PERCEP (MISCELLANEOUS) ×3 IMPLANT
PACK CARDIAC CATH (CUSTOM PROCEDURE TRAY) ×3 IMPLANT
WIRE SAFE-T 1.5MM-J .035X260CM (WIRE) ×3 IMPLANT

## 2015-01-09 NOTE — Discharge Instructions (Signed)
The drugs you were given will stay in your system until tomorrow, so for the next 24 hours you should not.  Drive an automobile. Make any legal decisions.  Drink any alcoholic beverages.  Today you should start with liquids and gradually work up to solid foods as your are able to tolerate them  Resume your regular medications as prescribed by your doctor.  Avoid aspirin for 24 hours.    Change the Band-Aid or dressing as needed.  After a 2 days no dressing as needed.  Avoid strenuous activity for the remainder of the day.  Please notify your primary physician immediately if you have any unusual bleeding, trouble breathing, fever >100 degrees or pain not relieved by the medication your doctor prescribed for your doctor prescribed for you physician  Keep affected arm immobilized for 24hrs, monitor access site for bleeding and or hematoma formation.  If bleeding occurs hold pressure on the site for 22minutes if bleeding persists then call 911 and resume pressure holding until help arrives.    Monitor affected arm and hand for any changes in sensation or circulation color changes.  If any of these occur contact your cardiologist immediately.  No heavy lifting or strenuous activity until Monday.    No driving for 48 hours after discharge.  The x-ray dye causes you to pass a considerate amount of urine.  For this reason, you will be asked to drink plenty of liquids after the procedure to prevent dehydration.  You may resume you regular diet.  Avoid caffeine products.    For pain at the site of your procedure, take non-aspirin medicines such as Tylenol.  Medications: A. Hold Metformin for 48 hours if applicable.  B. Continue taking all your present medications at home unless your doctor prescribes any changes.

## 2015-01-09 NOTE — H&P (View-Only) (Signed)
PATIENT: Hector [name] MRN: [id number] DOB: 05/13/1965 PCP: [name], NP  Clinic Note: Chief Complaint  Patient presents with  . other    3 month follow up. Meds reviewed by the patient verbally. "doing well."   . Cardiomyopathy    HPI: Hector [name] is a 49 y.o. male with a PMH below who presents today for cardiology followup for her heart failure (combined systolic and diastolic) in the context of atrial flutter/fibrillation. 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 Apparently this improved after he quit drinking alcohol.. I saw him in February 2016. He was actually admitted to the hospital at [facility] June 21 with atrial flutter with rapid response. He was cardioverted(TEE/DCCV), then reverted to atrial fibrillation and was started on amiodarone as well as Eliquis. He was started on chest low as well as carvedilol and diuretics.  He saw [name] from electrophysiology in the clinic on June 28 to determine further treatment options for atrial fibrillation/flutter as well as consider possibility of ICD referral.  Dr. [name] recommendation was to continue the amiodarone as he would not likely be a good candidate for ablative procedures to be discussed consider the possibility of cardiac catheterization to exclude an ischemic component of cardiomyopathy. And also agreed that he would be an appropriate candidate for ICD.  Studies Reviewed:   2D Echo 1/13/'16:   Severely reduced EF of 20-25%. Elevated LAP, impaired LV relaxation (GR 1 DD) mild-moderate LV dilation with severe LA dilation. Mild-mild MR and TR.  TEE/DCCV 6/23/'16:  Study Conclusions  - Left ventricle: Systolic function was severely reduced. The estimated ejection fraction was in the range of 20% to 25%. Diffuse hypokinesis. The study is not technically sufficient to allow evaluation of  LV diastolic function. No evidence of thrombus. - Mitral valve: There was mild regurgitation. - Left atrium: No evidence of thrombus in the atrial cavity or appendage. No evidence of thrombus in the appendage. The appendage was moderately to severely dilated. Emptying velocity was normal. - Right ventricle: The cavity size was moderately dilated. Systolic function was moderately to severely reduced. - Right atrium: The atrium was mildly dilated. - Atrial septum: No defect or patent foramen ovale was identified.  Impressions: - Successful cardioversion. No cardiac source of emboli was indentified in the LA, LA appendage or LV.  Interval History:  Hector [name] presents today doing quite well from a cardiac standpoint. He has no major complaints.  He has not had any further episodes of rapid irregular heartbeats since his cardioversion. He denies any PND, orthopnea or edema to suggest recurrent heart failure. He has less dizziness now. He does not do routine exercise, but is active. No myalgias or arthralgias on high-dose Lipitor.  The remainder of cardiac review of systems is as follows: Cardiovascular ROS: no chest pain or dyspnea on exertion negative for - loss of consciousness, orthopnea, palpitations, paroxysmal nocturnal dyspnea, rapid heart rate, shortness of breath or TIA/amaurosis fugax, syncope/near syncope :   Past Medical History  Diagnosis Date  . Hyperlipidemia   . Elevated troponin     Chronically elevated. - level was 0.17-0.10 during recent admission  . 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 No inducible ischemia, fixed inferoapical defect = Infarct vs.artifact (not commensurate with extent of reduced EF); b) Echo 8/'12: Mild LV dilation, Mod-Severe global hypokinesis - EF  25-35%. Mild-moderate RV dilation no significant bowel lesion mild-moderate MR; c) Echo Jan 2016: EF 20-25%, high LAP, mild-moderate LV dilation,  severe L  . Chronic combined systolic and diastolic heart failure, NYHA class 2 ([place])     a. see above  . Alcohol abuse   . Essential hypertension   . CKD (chronic kidney disease), stage III   . GI bleed 11/2013  . Myocardial infarct ([place]) 2004  . Pancreatitis   . Atrial flutter ([place])     a. new onset 07/2014; b. s/p unsuccessful TEE/DCCV 08/16/2014; c. on apixaban   . Atrial fibrillation ([place])     a. new onset s/p unsuccessful TEE/DCCV on 08/16/2014; b. on apixaban   . Sleep-disordered breathing     Has yet to have a sleep study    Prior Cardiac Evaluation and Past Surgical History: Past Surgical History  Procedure Laterality Date  . Knee surgery Right   . Nm myoview ltd  November 2011    No ischemia or infarction. EF 50-55% ( no improvement from 2009 Myoview EF of 28%  . Tee without cardioversion N/A 08/16/2014    Procedure: TRANSESOPHAGEAL ECHOCARDIOGRAM (TEE);  Surgeon: [name], MD;  Location: [facility] ORS;  Service: Cardiovascular;  Laterality: N/A;  . Electrophysiologic study N/A 08/16/2014    Procedure: CARDIOVERSION;  Surgeon: [name], MD;  Location: [facility] ORS;  Service: Cardiovascular;  Laterality: N/A;    Allergies  Allergen Reactions  . Eggs Or Egg-Derived Products Rash    Current Outpatient Prescriptions  Medication Sig Dispense Refill  . amiodarone (PACERONE) 200 MG tablet Take 1 tablet (200 mg total) by mouth daily. 60 tablet 3  . apixaban (ELIQUIS) 5 MG TABS tablet Take 1 tablet (5 mg total) by mouth 2 (two) times daily. 60 tablet 5  . atorvastatin (LIPITOR) 80 MG tablet Take 1 tablet (80 mg total) by mouth daily. 30 tablet 5  . carvedilol (COREG) 12.5 MG tablet Take 1 tablet (12.5 mg total) by mouth 2 (two) times daily. 30 tablet 3  . furosemide (LASIX) 40 MG tablet Take 1 tablet (40 mg total) by mouth daily. 30 tablet 5  . omeprazole (PRILOSEC) 20 MG capsule Take 1 capsule (20 mg total) by mouth daily. 30 capsule 5  . sacubitril-valsartan (ENTRESTO)  49-51 MG Take 1 tablet by mouth 2 (two) times daily. 60 tablet 3   No current facility-administered medications for this visit.   Social History   Social History  . Marital Status: Single    Spouse Name: N/A  . Number of Children: N/A  . Years of Education: N/A   Occupational History  . Not on file.   Social History Main Topics  . Smoking status: Former Smoker -- 1.00 packs/day for 12 years    Quit date: 02/23/1998  . Smokeless tobacco: Never Used  . Alcohol Use: No     Comment: 4 heavy alcohol drinker. Quit 2 years ago.  . Drug Use: No  . Sexual Activity: Not on file   Other Topics Concern  . Not on file   Social History Narrative    family history includes Diabetes in his mother; Hyperlipidemia in his mother; Hypertension in his mother.  ROS: A comprehensive Review of Systems - was performed Review of Systems  Constitutional: Positive for malaise/fatigue.  HENT: Negative for nosebleeds.   Eyes: Negative for blurred vision.  Respiratory: Positive for shortness of breath (Only with significant exertion) and wheezing (Occasional).   Cardiovascular: Negative for claudication.  Gastrointestinal: Negative for heartburn, blood in stool and melena.  Genitourinary: Negative for hematuria.  Musculoskeletal: Positive for joint pain (Bilateral knee).  Neurological: Positive for dizziness.  Endo/Heme/Allergies: Does not bruise/bleed easily.  Psychiatric/Behavioral: Negative.   All other systems reviewed and are negative.  Wt Readings from Last 3 Encounters:  12/19/14 287 lb 8 oz (130.409 kg)  10/02/14 281 lb (127.461 kg)  09/19/14 278 lb 8 oz (126.327 kg)    PHYSICAL EXAM BP 150/90 mmHg  Pulse 49  Ht 5\' 11"  (1.803 m)  Wt 287 lb 8 oz (130.409 kg)  BMI 40.12 kg/m2 General appearance: alert, cooperative, appears stated age, no distress, moderately obese; pleasant mood and affect. Neck: JVD - 2 cm above sternal notch, no adenopathy, no carotid bruit, supple, symmetrical,  trachea midline and thyroid not enlarged, symmetric, no tenderness/mass/nodules Lungs: normal percussion bilaterally and Diffuse interstitial sounds, no rhonchi or rales. Nonlabored. Heart: RR - bradycardic, normal S1, S2 with positive S4. Slightly lateral PMI, but nonsustained; 2/6 HSM at apex. High pitched blowing. Abdomen: soft, non-tender; bowel sounds normal; no masses,  no organomegaly and Obese Extremities: Trace lower extremity edema. No clubbing cyanosis Pulses: 2+ and symmetric Neurologic: Mental status: Alert, oriented, thought content appropriate, affect: normal Cranial nerves: normal   Adult ECG Report  Rate: 49;  Rhythm: sinus bradycardia  QRS Axis: -24 borderline LAD ;  PR Interval: 188;  QRS Duration: 108; QTc: 485,  Voltages: normal; nonspecific T wave changes with T wave inversions in 1 and aVL. Poor progression in precordial leads nonspecific.    Narrative Interpretation: No significant change from last EKG.  Recent Labs:  Lab Results  Component Value Date   CREATININE 2.07* 08/18/2014   Lab Results  Component Value Date   K 4.0 08/14/2014   Lab Results  Component Value Date   WBC 9.2 08/14/2014   HGB 13.9 08/14/2014   HCT 44.0 08/14/2014   MCV 80.3 08/14/2014   PLT 228 08/14/2014    ASSESSMENT / PLAN: Pleasant gentleman with long-standing cardiomyopathy.  Problem List Items Addressed This Visit    Paroxysmal atrial flutter ([place]) - Primary (Chronic)    Status post cardioversion with TEE guidance. Remains on amiodarone plus beta blocker. We can reduce the amiodarone dosing to once daily. Hopefully this will potentially allow [place] to increase the carvedilol back.      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 He did a negative Myoview, there was some EKG changes noted but  not any evidence of ischemia. This was many years ago and he has not had an ischemic evaluation since. He did have some chest discomfort with his atrial flutter, but has not had anymore now. With plans to refer back to electrophysiology for ICD placement, plan would be to proceed with diagnostic left heart catheterization to exclude ischemic component. As he is not having active heart failure symptoms, I will opt out of right heart catheterization.  I described the procedure with the risk and benefits as well as alternatives to the patient in detail. He agreed to proceed.  Risks / Complications include, but not limited to: Death, MI, CVA/TIA, VF/VT (with defibrillation), Bradycardia (need for temporary pacer placement), contrast induced nephropathy -- notably increased with him due to increased Cr level with baseline CKD-III; bleeding / bruising / hematoma /  pseudoaneurysm, vascular or coronary injury (with possible emergent CT or Vascular Surgery), adverse medication reactions, infection.  Additional risks involving the use of radiation with the possibility of radiation burns and cancer were explained in detail.  He will likely require prolonged precath hydration pending recheck of his lab values to ensure that his creatinine is stable.       Relevant Medications   sacubitril-valsartan (ENTRESTO) 49-51 MG   amiodarone (PACERONE) 200 MG tablet   Hyperlipidemia (Chronic)    On high-dose statin. We can check fasting lipid panel when he comes in for his procedure, since he will be nothing by mouth.      Relevant Medications   sacubitril-valsartan (ENTRESTO) 49-51 MG   amiodarone (PACERONE) 200 MG tablet   Essential hypertension (Chronic)    Not is well-controlled today is was before. He is now off clonidine which will allow [place] to increase Entresto dose to 49/51 mg.      Relevant Medications   sacubitril-valsartan (ENTRESTO) 49-51 MG   amiodarone (PACERONE) 200 MG tablet   Chronic kidney  disease (CKD), stage III (moderate) (Chronic)    Would like to coronary angiography with left ventricular hemodynamics but no LV gram. He will need gentle rehydration precath.      Chronic combined systolic and diastolic heart failure, NYHA class 2 (HCC) (Chronic)    Now on stable dose of standing Lasix with minimal use of sliding scale. As his blood pressure is elevated today, we can titrate up his Entresto to the Mid-level dosing. (49/51 mg). Only able to tolerate twice a day of carvedilol due to bradycardia. May be by reducing amiodarone, we will begin to increase it back to 12 twice a day.      Relevant Medications   sacubitril-valsartan (ENTRESTO) 49-51 MG   amiodarone (PACERONE) 200 MG tablet   Other Relevant Orders   EKG 12-Lead    Other Visit Diagnoses    Pre-procedure lab exam        Relevant Orders    Basic Metabolic Panel (BMET)    CBC    INR/PT       Meds ordered this encounter  Medications  . sacubitril-valsartan (ENTRESTO) 49-51 MG    Sig: Take 1 tablet by mouth 2 (two) times daily.    Dispense:  60 tablet    Refill:  3  . amiodarone (PACERONE) 200 MG tablet    Sig: Take 1 tablet (200 mg total) by mouth daily.    Dispense:  60 tablet    Refill:  3    Followup: With me post cath in 3-6 months, we will schedule him to see Dr. Caryl Comes back to address ICD post cath.   DAVID W. Ellyn Hack, M.D., M.S. Interventional Cardiolgy [facility]

## 2015-01-09 NOTE — Interval H&P Note (Signed)
History and Physical Interval Note:  01/09/2015 8:47 AM  Hector [name]  has presented today for surgery, with the diagnosis of Ischemic cardiomyopathy  The various methods of treatment have been discussed with the patient and family. After consideration of risks, benefits and other options for treatment, the patient has consented to  Procedure(s): Left Heart Cath (N/A) as a surgical intervention .    The patient's history has been reviewed, patient examined, no change in status, stable for surgery.  I have reviewed the patient's chart and labs.  Questions were answered to the patient's satisfaction.     Maeryn Mcgath W

## 2015-01-16 ENCOUNTER — Telehealth: Payer: Self-pay

## 2015-01-16 NOTE — Telephone Encounter (Signed)
Received records request DISABILITY DETERMINATION SERVIES, forwarded to [facility] for processing.

## 2015-01-29 ENCOUNTER — Ambulatory Visit (INDEPENDENT_AMBULATORY_CARE_PROVIDER_SITE_OTHER): Payer: Medicaid Other | Admitting: Internal Medicine

## 2015-01-29 ENCOUNTER — Encounter: Payer: Self-pay | Admitting: Internal Medicine

## 2015-01-29 VITALS — BP 119/87 | HR 50 | Ht 71.0 in | Wt 286.2 lb

## 2015-01-29 DIAGNOSIS — I5022 Chronic systolic (congestive) heart failure: Secondary | ICD-10-CM

## 2015-01-29 DIAGNOSIS — I429 Cardiomyopathy, unspecified: Secondary | ICD-10-CM

## 2015-01-29 DIAGNOSIS — I480 Paroxysmal atrial fibrillation: Secondary | ICD-10-CM | POA: Diagnosis not present

## 2015-01-29 DIAGNOSIS — I428 Other cardiomyopathies: Secondary | ICD-10-CM

## 2015-01-29 DIAGNOSIS — R001 Bradycardia, unspecified: Secondary | ICD-10-CM | POA: Insufficient documentation

## 2015-01-29 NOTE — Addendum Note (Signed)
Addended by: Alvis Lemmings C on: 01/29/2015 11:37 AM   Modules accepted: Orders

## 2015-01-29 NOTE — Progress Notes (Signed)
Patient Care Team: [name], NP as PCP - General (Nurse Practitioner) Alisa Graff, FNP as Nurse Practitioner (Family Medicine) [name], MD as Consulting Physician (Cardiology) Deboraha Sprang, MD as Consulting Physician (Cardiology)   HPI  Hector [name] is a 49 y.o. male Seen in follow-up for consideration of an ICD. Initially he was seen in June. He had at that time a presumed nonischemic cardiomyopathy and a subsequently undergone catheterization 11/16 demonstrating patent coronary arteries; ventriculography was deferred because of renal insufficiency.  He also has a history of paroxysmal atrial fibrillation/flutter with rapid rates for which he has been prescribed amiodarone as well as apixaban. Initially he was not taking them. He has been more compliant; cost issues have been addressed.  He also has obstructive sleep apnea by history; sleep study was recommended but apparently not yet completed.    Records and Results Reviewed As above   Past Medical History  Diagnosis Date  . Hyperlipidemia   . Elevated troponin     Chronically elevated. - level was 0.17-0.10 during recent admission  . 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 No inducible ischemia, fixed inferoapical defect = Infarct vs.artifact (not commensurate with extent of reduced EF); b) Echo 8/'12: Mild LV dilation, Mod-Severe global hypokinesis - EF 25-35%. Mild-moderate RV dilation no significant bowel lesion mild-moderate MR; c) Echo Jan 2016: EF 20-25%, high LAP, mild-moderate LV dilation, severe L  . Chronic combined systolic and diastolic heart failure, NYHA class 2 ([place])     a. see above  . Alcohol abuse   . Essential hypertension   . CKD (chronic kidney disease), stage III   . GI bleed 11/2013  . Myocardial infarct ([place]) 2004  . Pancreatitis   . Atrial flutter ([place])     a. new onset 07/2014; b. s/p unsuccessful TEE/DCCV  08/16/2014; c. on apixaban   . Atrial fibrillation ([place])     a. new onset s/p unsuccessful TEE/DCCV on 08/16/2014; b. on apixaban   . Sleep-disordered breathing     Has yet to have a sleep study    Past Surgical History  Procedure Laterality Date  . Knee surgery Right   . Nm myoview ltd  November 2011    No ischemia or infarction. EF 50-55% ( no improvement from 2009 Myoview EF of 28%  . Tee without cardioversion N/A 08/16/2014    Procedure: TRANSESOPHAGEAL ECHOCARDIOGRAM (TEE);  Surgeon: [name], MD;  Location: [facility] ORS;  Service: Cardiovascular;  Laterality: N/A;  . Electrophysiologic study N/A 08/16/2014    Procedure: CARDIOVERSION;  Surgeon: [name], MD;  Location: [facility] ORS;  Service: Cardiovascular;  Laterality: N/A;  . Cardiac catheterization N/A 01/09/2015    Procedure: Left Heart Cath and Coronary Angiography;  Surgeon: [name], MD;  Location: [place] [facility];  Service: Cardiovascular;  Laterality: N/A;    Current Outpatient Prescriptions  Medication Sig Dispense Refill  . amiodarone (PACERONE) 200 MG tablet Take 1 tablet (200 mg total) by mouth daily. 60 tablet 3  . apixaban (ELIQUIS) 5 MG TABS tablet Take 1 tablet (5 mg total) by mouth 2 (two) times daily. 60 tablet 5  . atorvastatin (LIPITOR) 80 MG tablet Take 1 tablet (80 mg total) by mouth daily. 30 tablet 5  . carvedilol (COREG) 12.5 MG tablet Take 1 tablet (12.5 mg total) by mouth 2 (two) times daily. 30 tablet 3  . furosemide (  LASIX) 40 MG tablet Take 1 tablet (40 mg total) by mouth daily. 30 tablet 5  . omeprazole (PRILOSEC) 20 MG capsule Take 1 capsule (20 mg total) by mouth daily. 30 capsule 5  . sacubitril-valsartan (ENTRESTO) 49-51 MG Take 1 tablet by mouth 2 (two) times daily. 60 tablet 3   Current Facility-Administered Medications  Medication Dose Route Frequency Provider Last Rate Last Dose  . 0.9 %  sodium chloride infusion   Intravenous Continuous [name], MD         Allergies  Allergen Reactions  . Eggs Or Egg-Derived Products Rash      Review of Systems negative except from HPI and PMH  Physical Exam BP 119/87 mmHg  Pulse 50  Ht 5\' 11"  (1.803 m)  Wt 286 lb 4 oz (129.842 kg)  BMI 39.94 kg/m2 Well developed and /Morbidly obese  in no acute distress HENT normal E scleral and icterus clear Neck Supple JVP flat; carotids brisk and full Clear to ausculation regular rate and rhythm, no murmurs gallops or rub Soft with active bowel sounds No clubbing cyanosis  Edema Alert and oriented, grossly normal motor and sensory function Skin Warm and Dry  ECG demonstrates sinus at 50 Intervals 18/11/52  Assessment and  Plan  Nonischemic cardiomyopathy  Renal insufficiency grade 3  Sinus bradycardia  Morbid obesity  Congestive heart failure-chronic-systolic   hypertension with cardiomyopathy    The patient is on guideline directed medical therapy. We will reassess left ventricular function as it has been months. I suspect that his LV dysfunction will be persistently abnormal and he will be appropriately considered for ICD therapy.    We have discussed the relative benefits and merits of transvenous versus subcutaneous ICD implantation.  The advantages of the former including battery longevity, history derived from use in randomized controlled trials and perhaps a somewhat lower rate of inappropriate ICD discharges. Advantages of the latter  include the fact that it is extravascular,  Resulting different implications of device infection and it being non-transvalvular.    With his history of bradycardia  and non-maximal beta blockers, I would favor the use of a transvenous device.  Probably favor [employer] given the longevity of the battery  We will work on trying to  get him set up for sleep study to the [facility] [employer] [facility] clinic.  We will recheck his metabolic profile following his catheterization  He would like to undergo ICD  implantation next month

## 2015-01-29 NOTE — Patient Instructions (Signed)
Medication Instructions: - no changes  Labwork: - Your physician recommends that you have lab work today: BMP  Procedures/Testing: - Your physician has recommended that you have a defibrillator inserted. An implantable cardioverter defibrillator (ICD) is a small device that is placed in your chest or, in rare cases, your abdomen. This device uses electrical pulses or shocks to help control life-threatening, irregular heartbeats that could lead the heart to suddenly stop beating (sudden cardiac arrest). Leads are attached to the ICD that goes into your heart. This is done in the hospital and usually requires an overnight stay. Nira Conn, RN for Dr. Caryl Comes will be in touch with you to confirm the date/ time/ instructions  - Your physician has requested that you have an echocardiogram. Echocardiography is a painless test that uses sound waves to create images of your heart. It provides your doctor with information about the size and shape of your heart and how well your heart's chambers and valves are working. This procedure takes approximately one hour. There are no restrictions for this procedure.  Follow-Up: - Pending your procedure with Dr. Caryl Comes.  Any Additional Special Instructions Will Be Listed Below (If Applicable).

## 2015-01-30 ENCOUNTER — Other Ambulatory Visit: Payer: Self-pay | Admitting: Family Medicine

## 2015-01-30 ENCOUNTER — Ambulatory Visit
Admission: RE | Admit: 2015-01-30 | Discharge: 2015-01-30 | Disposition: A | Discharge status: Home / Self Care | Payer: Disability Insurance | Source: Ambulatory Visit | Attending: Family Medicine | Admitting: Family Medicine

## 2015-01-30 DIAGNOSIS — M778 Other enthesopathies, not elsewhere classified: Secondary | ICD-10-CM | POA: Diagnosis not present

## 2015-01-30 DIAGNOSIS — M25569 Pain in unspecified knee: Secondary | ICD-10-CM

## 2015-01-30 DIAGNOSIS — Z9889 Other specified postprocedural states: Secondary | ICD-10-CM

## 2015-01-30 DIAGNOSIS — M25562 Pain in left knee: Secondary | ICD-10-CM | POA: Diagnosis not present

## 2015-01-30 DIAGNOSIS — M25561 Pain in right knee: Secondary | ICD-10-CM | POA: Diagnosis not present

## 2015-01-30 LAB — BASIC METABOLIC PANEL
BUN/Creatinine Ratio: 11 (ref 9–20)
BUN: 26 mg/dL — AB (ref 6–24)
CALCIUM: 8.8 mg/dL (ref 8.7–10.2)
CO2: 18 mmol/L (ref 18–29)
Chloride: 104 mmol/L (ref 97–106)
Creatinine, Ser: 2.29 mg/dL — ABNORMAL HIGH (ref 0.76–1.27)
GFR, EST AFRICAN AMERICAN: 37 mL/min/{1.73_m2} — AB (ref >59)
GFR, EST NON AFRICAN AMERICAN: 32 mL/min/{1.73_m2} — AB (ref >59)
Glucose: 102 mg/dL — ABNORMAL HIGH (ref 65–99)
Potassium: 4.4 mmol/L (ref 3.5–5.2)
Sodium: 141 mmol/L (ref 136–144)

## 2015-01-30 IMAGING — CR DG KNEE 1-2V*L*
1 series · 2 of 2 positions shown · non-contrast
Comparison: None.

CLINICAL DATA: Left knee pain and weakness

EXAM:
LEFT KNEE - 1-2 VIEW

[Series 1: dg knee 1-2 views left · 0.14mm/px · 2 of 2 slices shown]
[im 1/2]
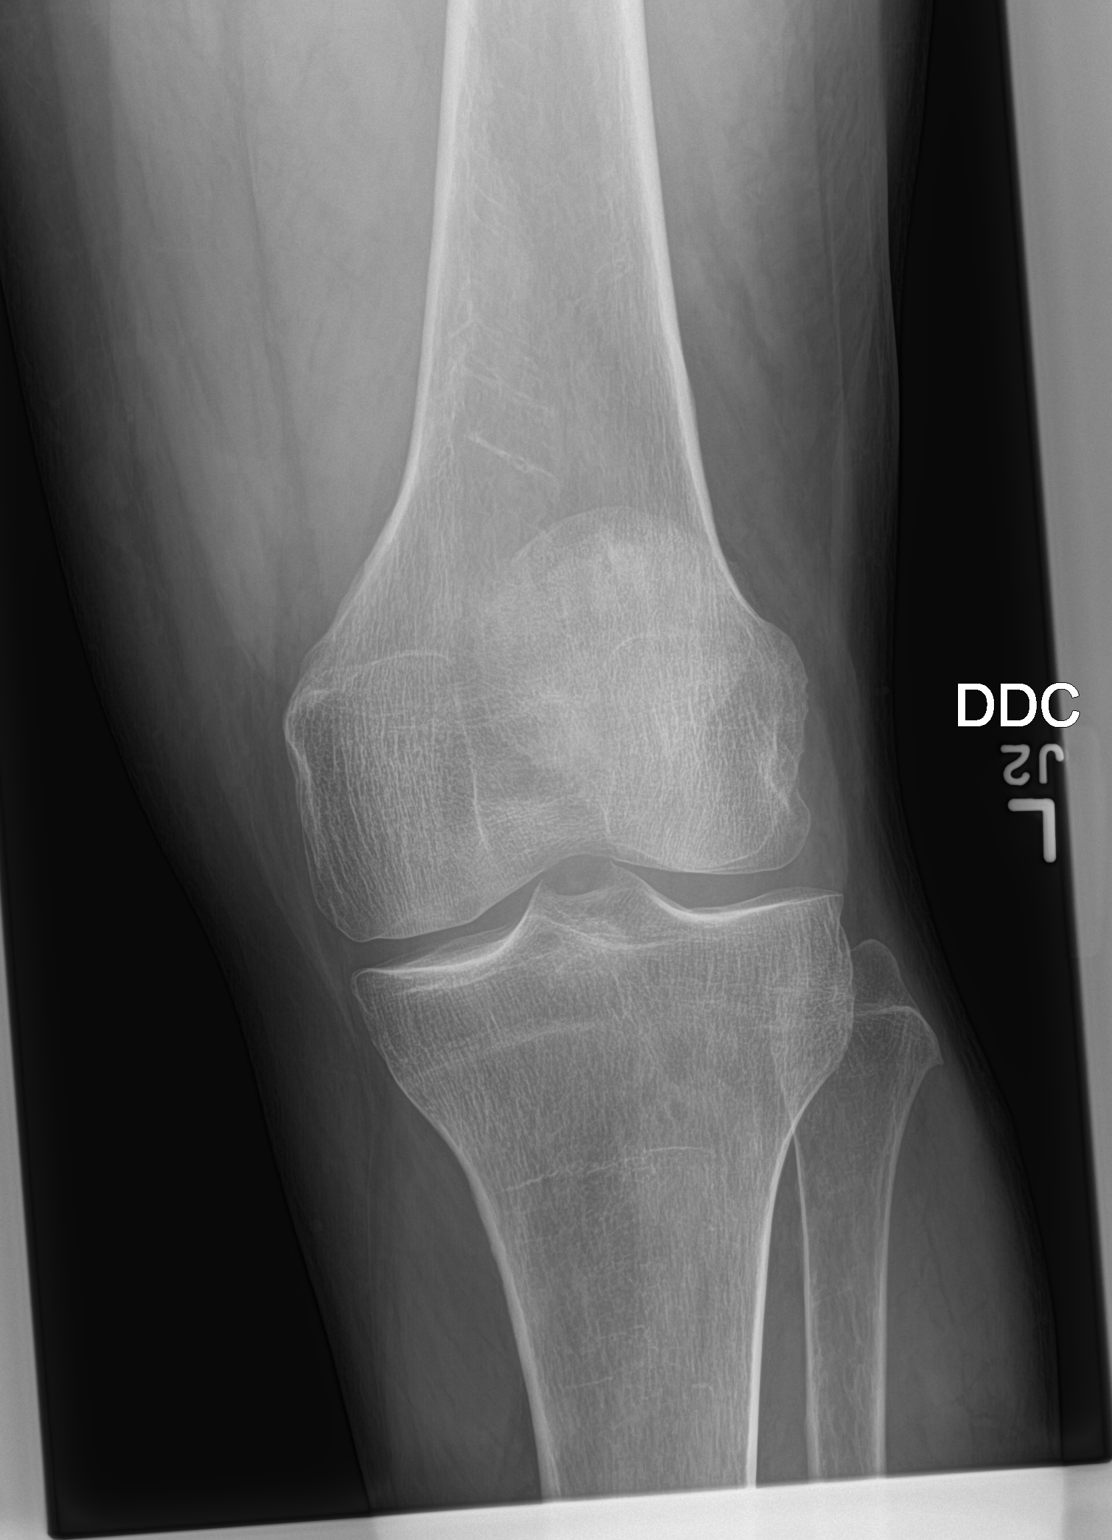
[im 2/2]
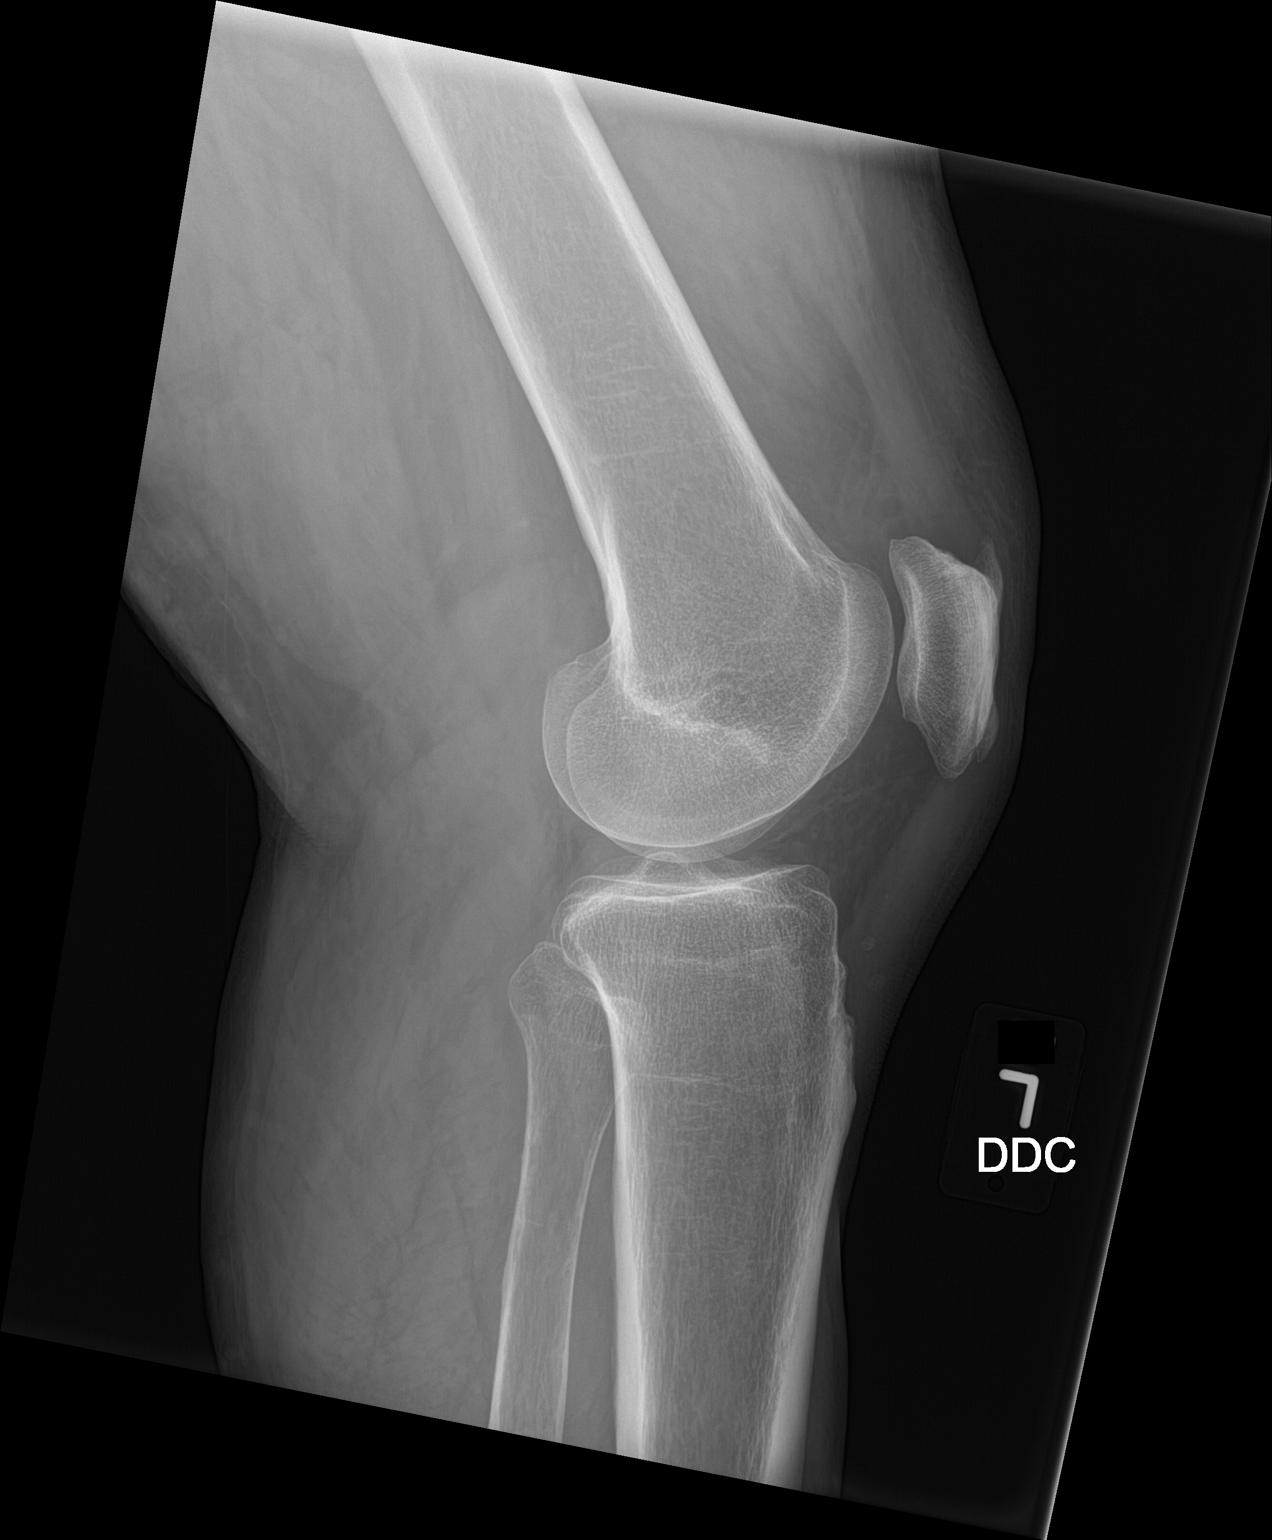

[2 of 2 positions shown; findings below may reference images not displayed]

FINDINGS: Two views of the left knee show no significant degenerative joint
disease. The joint spaces are relatively well preserved. No
significant spurring is seen. No joint effusion is noted.
IMPRESSION: Negative.

## 2015-01-30 IMAGING — CR DG KNEE 1-2V*R*
1 series · 2 of 2 positions shown · non-contrast
Comparison: [DATE]

CLINICAL DATA: Unable to bear weight, knee pain post surgery

EXAM:
RIGHT KNEE - 1-2 VIEW

[Series 1: dg knee 1-2 views right · 0.14mm/px · 2 of 2 slices shown]
[im 1/2]
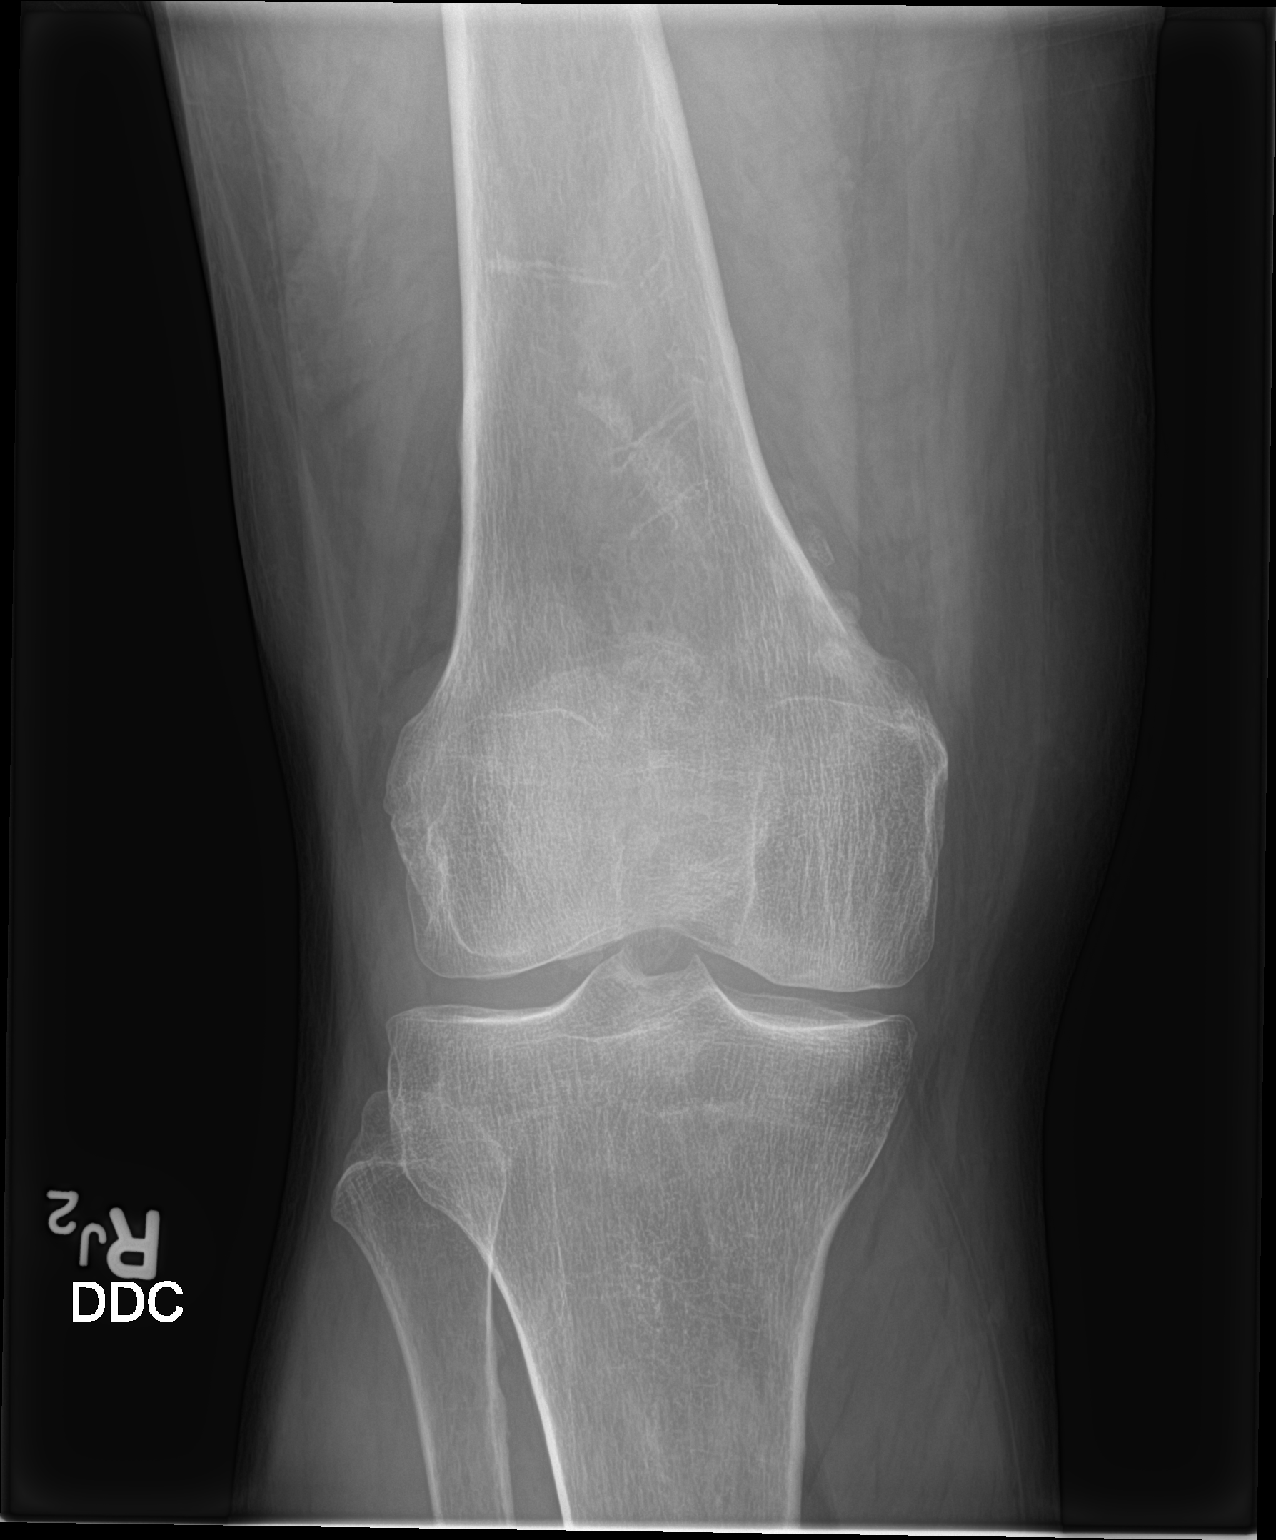
[im 2/2]
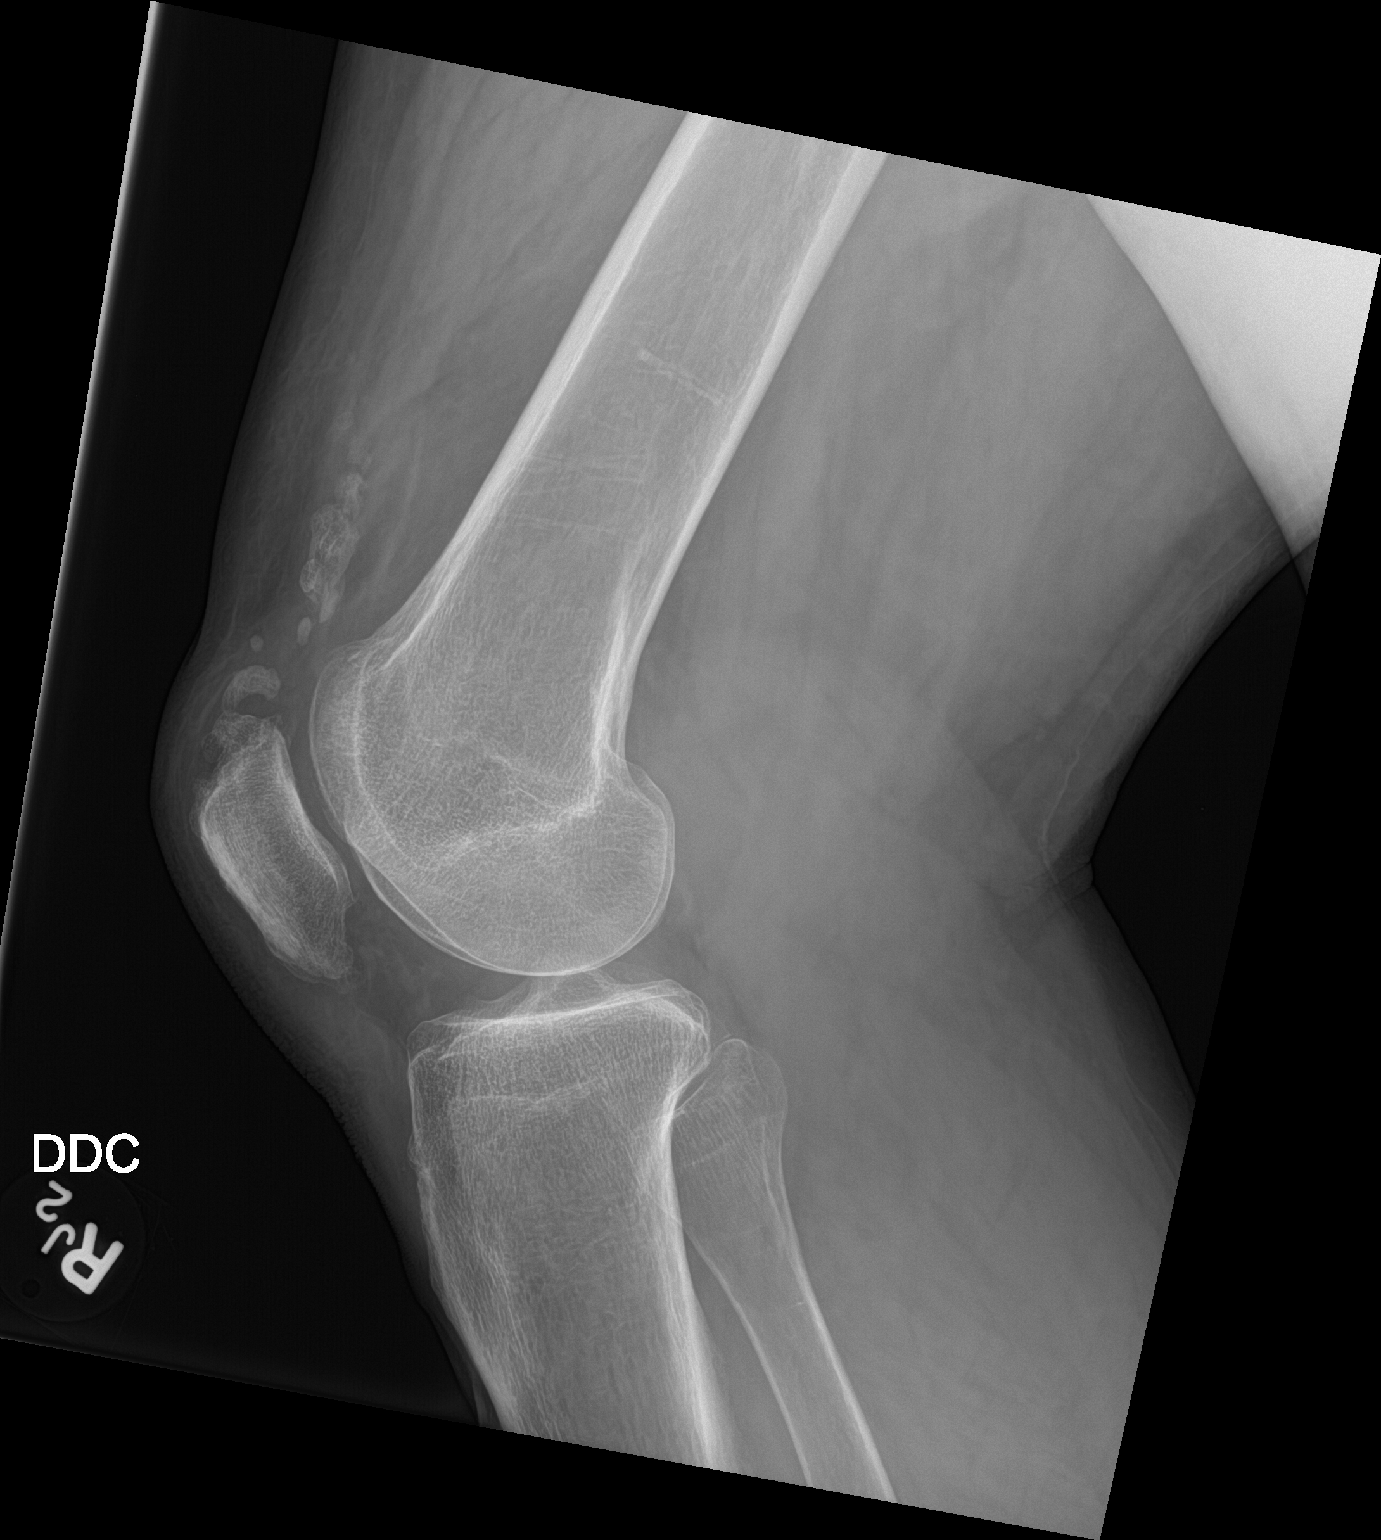

[2 of 2 positions shown; findings below may reference images not displayed]

FINDINGS: Two views of the right knee submitted. No acute fracture or
subluxation. Minimal narrowing of medial joint compartment. Spurring
of patella. Dystrophic calcifications are noted above the patella.
Calcific tendinopathy cannot be excluded.
IMPRESSION: Minimal narrowing of medial joint compartment. Spurring of patella.
Dystrophic calcifications are noted above the patella. Calcific
tendinopathy cannot be excluded. No acute fracture or subluxation.

## 2015-02-01 ENCOUNTER — Telehealth: Payer: Self-pay | Admitting: *Deleted

## 2015-02-01 ENCOUNTER — Telehealth: Payer: Self-pay

## 2015-02-01 NOTE — Telephone Encounter (Signed)
Patient called and he would like to schedule his surgery for his defib on 03/13/15. Please call patient.

## 2015-02-01 NOTE — Telephone Encounter (Signed)
Received records request Disability Determination Services, forwarded to [employer] for processing.  

## 2015-02-01 NOTE — Telephone Encounter (Signed)
Forward to [employer]

## 2015-02-05 ENCOUNTER — Telehealth: Payer: Self-pay | Admitting: Internal Medicine

## 2015-02-05 NOTE — Telephone Encounter (Signed)
I spoke with the patient. I advised him I will schedule his ICD implant for 03/13/15 per his request. He needs a 2nd case slot. I will call him back to confirm the date/ time.

## 2015-02-05 NOTE — Telephone Encounter (Signed)
Follow Up ° ° °Pt is returning a call from earlier. Please call. °

## 2015-02-05 NOTE — Telephone Encounter (Signed)
I spoke with the patient regarding his lab results.

## 2015-02-12 ENCOUNTER — Telehealth: Payer: Self-pay

## 2015-02-12 DIAGNOSIS — I428 Other cardiomyopathies: Secondary | ICD-10-CM

## 2015-02-12 DIAGNOSIS — Z01812 Encounter for preprocedural laboratory examination: Secondary | ICD-10-CM

## 2015-02-12 NOTE — Telephone Encounter (Signed)
Returned patient's call.  He wanted to know if he could have his procedure done in [place] instead of in [place] as he does not have transportation to [place].  Advised patient that I would defer his question to Dr. [name] nurse.  He voices understanding and appreciation.  Patient denies additional questions or concerns at this time.  He also requests that we contact him at his mobile number instead of his home phone number.  Advised patient that I would update his chart with the information.

## 2015-02-12 NOTE — Telephone Encounter (Signed)
Pt would like to know the details of his ICD implantation scheduled for 1/18. please call.

## 2015-02-14 NOTE — Telephone Encounter (Signed)
I spoke with the patient. I advised him that Dr. Caryl Comes is only doing procedures in [name] at the present time.  He will try to get a ride to [place]. Verbal instructions given to the patient for his procedure on 03/13/15. Will clarify with Dr. Caryl Comes if Eliquis needs to be held. Copy of instructions will be mailed to the patient. Mailing address verified.

## 2015-02-21 ENCOUNTER — Ambulatory Visit (INDEPENDENT_AMBULATORY_CARE_PROVIDER_SITE_OTHER): Payer: Medicaid Other

## 2015-02-21 ENCOUNTER — Encounter: Payer: Self-pay | Admitting: *Deleted

## 2015-02-21 ENCOUNTER — Other Ambulatory Visit: Payer: Self-pay

## 2015-02-21 DIAGNOSIS — I429 Cardiomyopathy, unspecified: Secondary | ICD-10-CM | POA: Diagnosis not present

## 2015-02-21 DIAGNOSIS — I5022 Chronic systolic (congestive) heart failure: Secondary | ICD-10-CM

## 2015-02-21 DIAGNOSIS — I428 Other cardiomyopathies: Secondary | ICD-10-CM

## 2015-03-06 ENCOUNTER — Other Ambulatory Visit: Payer: Disability Insurance

## 2015-03-13 ENCOUNTER — Ambulatory Visit (HOSPITAL_COMMUNITY)
Admission: RE | Admit: 2015-03-13 | Payer: Disability Insurance | Source: Ambulatory Visit | Admitting: Internal Medicine

## 2015-03-13 ENCOUNTER — Encounter (HOSPITAL_COMMUNITY): Admission: RE | Payer: Self-pay | Source: Ambulatory Visit

## 2015-03-13 SURGERY — ICD IMPLANT
Anesthesia: LOCAL

## 2015-03-25 ENCOUNTER — Ambulatory Visit: Payer: Disability Insurance

## 2015-04-01 ENCOUNTER — Ambulatory Visit: Payer: Medicaid Other | Attending: Family | Admitting: Family

## 2015-04-01 ENCOUNTER — Encounter: Payer: Self-pay | Admitting: Family

## 2015-04-01 VITALS — BP 128/90 | HR 57 | Resp 20 | Ht 71.0 in | Wt 292.0 lb

## 2015-04-01 DIAGNOSIS — N183 Chronic kidney disease, stage 3 (moderate): Secondary | ICD-10-CM | POA: Diagnosis not present

## 2015-04-01 DIAGNOSIS — I10 Essential (primary) hypertension: Secondary | ICD-10-CM

## 2015-04-01 DIAGNOSIS — Z7901 Long term (current) use of anticoagulants: Secondary | ICD-10-CM | POA: Insufficient documentation

## 2015-04-01 DIAGNOSIS — Z87891 Personal history of nicotine dependence: Secondary | ICD-10-CM | POA: Insufficient documentation

## 2015-04-01 DIAGNOSIS — I4891 Unspecified atrial fibrillation: Secondary | ICD-10-CM | POA: Diagnosis not present

## 2015-04-01 DIAGNOSIS — R001 Bradycardia, unspecified: Secondary | ICD-10-CM | POA: Insufficient documentation

## 2015-04-01 DIAGNOSIS — F1010 Alcohol abuse, uncomplicated: Secondary | ICD-10-CM | POA: Insufficient documentation

## 2015-04-01 DIAGNOSIS — K859 Acute pancreatitis without necrosis or infection, unspecified: Secondary | ICD-10-CM | POA: Diagnosis not present

## 2015-04-01 DIAGNOSIS — I5032 Chronic diastolic (congestive) heart failure: Secondary | ICD-10-CM

## 2015-04-01 DIAGNOSIS — Z79899 Other long term (current) drug therapy: Secondary | ICD-10-CM | POA: Diagnosis not present

## 2015-04-01 DIAGNOSIS — I252 Old myocardial infarction: Secondary | ICD-10-CM | POA: Diagnosis not present

## 2015-04-01 DIAGNOSIS — I129 Hypertensive chronic kidney disease with stage 1 through stage 4 chronic kidney disease, or unspecified chronic kidney disease: Secondary | ICD-10-CM | POA: Diagnosis not present

## 2015-04-01 DIAGNOSIS — I5042 Chronic combined systolic (congestive) and diastolic (congestive) heart failure: Secondary | ICD-10-CM | POA: Insufficient documentation

## 2015-04-01 DIAGNOSIS — Z9889 Other specified postprocedural states: Secondary | ICD-10-CM | POA: Insufficient documentation

## 2015-04-01 DIAGNOSIS — E785 Hyperlipidemia, unspecified: Secondary | ICD-10-CM | POA: Insufficient documentation

## 2015-04-01 DIAGNOSIS — I480 Paroxysmal atrial fibrillation: Secondary | ICD-10-CM

## 2015-04-01 DIAGNOSIS — I426 Alcoholic cardiomyopathy: Secondary | ICD-10-CM | POA: Insufficient documentation

## 2015-04-01 NOTE — Progress Notes (Signed)
Subjective:    [name], male    DOB: 12-27-1965, 50 y.o.   MRN: [id number]  Congestive Heart Failure Presents for follow-up visit. The disease course has been stable. Pertinent negatives include no abdominal pain, chest pain, edema, fatigue, orthopnea, palpitations or shortness of breath. The symptoms have been stable. Past treatments include angiotensin receptor blockers, beta blockers and salt and fluid restriction. The treatment provided moderate relief. Compliance with prior treatments has been good. His past medical history is significant for arrhythmia and HTN. Compliance with total regimen is 76-100%.  Hypertension This is a chronic problem. The current episode started more than 1 year ago. The problem is unchanged. Pertinent negatives include no chest pain, headaches, neck pain, palpitations, peripheral edema or shortness of breath. There are no associated agents to hypertension. Risk factors for coronary artery disease include obesity, male gender and family history. Past treatments include beta blockers, angiotensin blockers, diuretics and lifestyle changes. The current treatment provides moderate improvement. Hypertensive end-organ damage includes heart failure. There is no history of CVA.   Past Medical History  Diagnosis Date  . Hyperlipidemia   . Elevated troponin     Chronically elevated. - level was 0.17-0.10 during recent admission  . 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 No inducible ischemia, fixed inferoapical defect = Infarct vs.artifact (not commensurate with extent of reduced EF); b) Echo 8/'12: Mild LV dilation, Mod-Severe global hypokinesis - EF 25-35%. Mild-moderate RV dilation no significant bowel lesion mild-moderate MR; c) Echo Jan 2016: EF 20-25%, high LAP, mild-moderate LV dilation, severe L  . Chronic combined systolic and diastolic heart failure, NYHA class 2 ([place])     a. see  above  . Alcohol abuse   . Essential hypertension   . CKD (chronic kidney disease), stage III   . GI bleed 11/2013  . Myocardial infarct ([place]) 2004  . Pancreatitis   . Atrial flutter ([place])     a. new onset 07/2014; b. s/p unsuccessful TEE/DCCV 08/16/2014; c. on apixaban   . Atrial fibrillation ([place])     a. new onset s/p unsuccessful TEE/DCCV on 08/16/2014; b. on apixaban   . Sleep-disordered breathing     Has yet to have a sleep study    Past Surgical History  Procedure Laterality Date  . Knee surgery Right   . Nm myoview ltd  November 2011    No ischemia or infarction. EF 50-55% ( no improvement from 2009 Myoview EF of 28%  . Tee without cardioversion N/A 08/16/2014    Procedure: TRANSESOPHAGEAL ECHOCARDIOGRAM (TEE);  Surgeon: [name], MD;  Location: [facility] OR[facility];  Service: Cardiovascular;  Laterality: N/A;  . Electrophysiologic study N/A 08/16/2014    Procedure: CARDIOVERSION;  Surgeon: [name], MD;  Location: [facility];  Service: Cardiovascular;  Laterality: N/A;  . Cardiac catheterization N/A 01/09/2015    Procedure: Left Heart Cath and Coronary Angiography;  Surgeon: [name], MD;  Location: [facility];  Service: Cardiovascular;  Laterality: N/A;    Family History  Problem Relation Age of Onset  . Hypertension Mother   . Hyperlipidemia Mother   . Diabetes Mother     Social History  Substance Use Topics  . Smoking status: Former Smoker -- 1.00 packs/day for 12 years    Quit date: 02/23/1998  . Smokeless tobacco: Never Used  . Alcohol Use: No     Comment: 4 heavy alcohol drinker. Quit  2 years ago.    Allergies  Allergen Reactions  . Eggs Or Egg-Derived Products Rash    Prior to Admission medications   Medication Sig Start Date End Date Taking? Authorizing Provider  amiodarone (PACERONE) 200 MG tablet Take 1 tablet (200 mg total) by mouth daily. 12/19/14  Yes [name], MD  apixaban (ELIQUIS) 5 MG TABS tablet Take 1 tablet (5 mg  total) by mouth 2 (two) times daily. 09/12/14  Yes Alisa Graff, FNP  atorvastatin (LIPITOR) 80 MG tablet Take 1 tablet (80 mg total) by mouth daily. 09/12/14  Yes Alisa Graff, FNP  carvedilol (COREG) 12.5 MG tablet Take 1 tablet (12.5 mg total) by mouth 2 (two) times daily. 09/19/14  Yes [name], MD  furosemide (LASIX) 40 MG tablet Take 1 tablet (40 mg total) by mouth daily. 09/12/14  Yes Alisa Graff, FNP  omeprazole (PRILOSEC) 20 MG capsule Take 1 capsule (20 mg total) by mouth daily. 09/12/14  Yes Alisa Graff, FNP  sacubitril-valsartan (ENTRESTO) 49-51 MG Take 1 tablet by mouth 2 (two) times daily. 12/19/14  Yes [name], MD      Review of Systems  Constitutional: Negative for appetite change and fatigue.  HENT: Negative for congestion, postnasal drip and sore throat.   Eyes: Negative.   Respiratory: Negative for cough, chest tightness and shortness of breath.   Cardiovascular: Negative for chest pain, palpitations and leg swelling.  Gastrointestinal: Negative for abdominal pain and abdominal distention.  Endocrine: Negative.   Genitourinary: Negative.   Musculoskeletal: Negative for back pain and neck pain.  Skin: Negative.   Allergic/Immunologic: Negative.   Neurological: Positive for light-headedness (wilth changing positions too quickly). Negative for dizziness and headaches.  Hematological: Negative for adenopathy. Does not bruise/bleed easily.  Psychiatric/Behavioral: Negative for sleep disturbance (sleeping on 2 pillows) and dysphoric mood. The patient is not nervous/anxious.        Objective:   Physical Exam  Constitutional: He is oriented to person, place, and time. He appears well-developed and well-nourished.  HENT:  Head: Normocephalic and atraumatic.  Eyes: Conjunctivae are normal. Pupils are equal, round, and reactive to light.  Neck: Normal range of motion. Neck supple.  Cardiovascular: An irregular rhythm present. Bradycardia present.    Pulmonary/Chest: Effort normal. He has no wheezes. He has no rales.  Abdominal: Soft. He exhibits no distension. There is no tenderness.  Musculoskeletal: He exhibits no edema or tenderness.  Neurological: He is alert and oriented to person, place, and time.  Skin: Skin is warm and dry.  Psychiatric: He has a normal mood and affect. His behavior is normal. Thought content normal.  Nursing note and vitals reviewed.   BP 128/90 mmHg  Pulse 57  Resp 20  Ht 5\' 11"  (1.803 m)  Wt 292 lb (132.45 kg)  BMI 40.74 kg/m2  SpO2 97%       Assessment & Plan:  1: Chronic heart failure with preserved ejection fraction- Patient presents without any fatigue or shortness of breath. Currently doesn't have any swelling in his lower legs either. Was supposed to get an ICD implanted but his most recent echocardiogram showed an improvement in his EF to 50-55%. He continues to weigh himself and has noticed a gradual weight gain which he says is because he "eats too much". By our scale, he has gained 4 pounds since November 2016. Encouraged him to decrease caloric consumption and to get moving more. He is not adding salt to his food  and tries to follow a low sodium diet. Sees his cardiologist 04/17/15. 2: HTN- Blood pressure initially elevated but was coming down nicely on recheck. Will continue to monitor. Sees his PCP 04/24/15. 3: Atrial fibrillation- Bradycardic today and continues on amiodarone daily and carvedilol twice daily. Also taking eliquis.  Return here in 3 months or sooner for any questions/problems before then.

## 2015-04-01 NOTE — Patient Instructions (Signed)
Continue weighing daily and call for an overnight weight gain of > 2 pounds or a weekly weight gain of >5 pounds. 

## 2015-04-17 ENCOUNTER — Ambulatory Visit: Payer: Disability Insurance | Admitting: Cardiology

## 2015-05-06 ENCOUNTER — Other Ambulatory Visit: Payer: Self-pay | Admitting: *Deleted

## 2015-05-06 DIAGNOSIS — I5042 Chronic combined systolic (congestive) and diastolic (congestive) heart failure: Secondary | ICD-10-CM

## 2015-05-06 MED ORDER — SACUBITRIL-VALSARTAN 49-51 MG PO TABS: 1.0000 | ORAL_TABLET | Freq: Two times a day (BID) | ORAL | Status: DC

## 2015-05-07 ENCOUNTER — Other Ambulatory Visit: Payer: Self-pay

## 2015-05-07 DIAGNOSIS — I5042 Chronic combined systolic (congestive) and diastolic (congestive) heart failure: Secondary | ICD-10-CM

## 2015-05-07 MED ORDER — SACUBITRIL-VALSARTAN 49-51 MG PO TABS: 1.0000 | ORAL_TABLET | Freq: Two times a day (BID) | ORAL | Status: DC

## 2015-05-08 ENCOUNTER — Ambulatory Visit: Payer: Disability Insurance | Admitting: Cardiology

## 2015-05-22 ENCOUNTER — Encounter: Payer: Self-pay | Admitting: Cardiology

## 2015-05-22 ENCOUNTER — Ambulatory Visit (INDEPENDENT_AMBULATORY_CARE_PROVIDER_SITE_OTHER): Payer: Disability Insurance | Admitting: Cardiology

## 2015-05-22 VITALS — BP 122/90 | HR 46 | Ht 71.0 in | Wt 294.0 lb

## 2015-05-22 DIAGNOSIS — I480 Paroxysmal atrial fibrillation: Secondary | ICD-10-CM | POA: Diagnosis not present

## 2015-05-22 DIAGNOSIS — I4892 Unspecified atrial flutter: Secondary | ICD-10-CM

## 2015-05-22 DIAGNOSIS — I5042 Chronic combined systolic (congestive) and diastolic (congestive) heart failure: Secondary | ICD-10-CM

## 2015-05-22 DIAGNOSIS — I110 Hypertensive heart disease with heart failure: Secondary | ICD-10-CM

## 2015-05-22 DIAGNOSIS — R001 Bradycardia, unspecified: Secondary | ICD-10-CM

## 2015-05-22 DIAGNOSIS — I10 Essential (primary) hypertension: Secondary | ICD-10-CM

## 2015-05-22 DIAGNOSIS — I429 Cardiomyopathy, unspecified: Secondary | ICD-10-CM

## 2015-05-22 DIAGNOSIS — E785 Hyperlipidemia, unspecified: Secondary | ICD-10-CM | POA: Diagnosis not present

## 2015-05-22 DIAGNOSIS — I428 Other cardiomyopathies: Secondary | ICD-10-CM

## 2015-05-22 MED ORDER — AMIODARONE HCL 200 MG PO TABS: 200.0000 mg | ORAL_TABLET | Freq: Every day | ORAL | Status: DC

## 2015-05-22 MED ORDER — OMEPRAZOLE 20 MG PO CPDR: 20.0000 mg | DELAYED_RELEASE_CAPSULE | Freq: Every day | ORAL | Status: DC

## 2015-05-22 MED ORDER — CARVEDILOL 12.5 MG PO TABS: 12.5000 mg | ORAL_TABLET | Freq: Two times a day (BID) | ORAL | Status: DC

## 2015-05-22 MED ORDER — FUROSEMIDE 40 MG PO TABS: 40.0000 mg | ORAL_TABLET | Freq: Every day | ORAL | Status: DC

## 2015-05-22 NOTE — Progress Notes (Signed)
PATIENT: [name] MRN: [id number] DOB: October 11, 1965 PCP: [name], NP  Clinic Note: Chief Complaint  Patient presents with  . Follow-up    No complaints. Meds reviewed verbally with pt.    HPI: [name] is a 50 y.o. male with a PMH below who presents today for cardiology followup for her heart failure (combined systolic and diastolic) in the context of atrial flutter/fibrillation. He apparently has a significant history of alcoholic (nonischemic) cardiomyopathy dating back 2009 - with an echocardiogram showing an EF as low as 20%. Apparently this improved after he quit drinking alcohol.. I saw him in February 2016. He was actually admitted to the hospital at [facility] June 21 with atrial flutter with rapid response. He was cardioverted(TEE/DCCV), then reverted to atrial fibrillation and was started on amiodarone as well as Eliquis.   He saw [name] from electrophysiology in the clinic on June 28 to determine further treatment options for atrial fibrillation/flutter as well as consider possibility of ICD referral.  Dr. [name] recommendation was to continue the amiodarone as he would not likely be a good candidate for ablative procedures to be discussed consider the possibility of cardiac catheterization to exclude an ischemic component of cardiomyopathy. And also agreed that he would be an appropriate candidate for ICD if indicated.   Studies Reviewed:   2D Echo 1/13/'16:   Severely reduced EF of 20-25%. Elevated LAP, impaired LV relaxation (GR 1 DD) mild-moderate LV dilation with severe LA dilation. Mild-mild MR and TR.  TEE/DCCV 6/23/'16:   Procedures    Left Heart Cath and Coronary Angiography : 01/09/2015    Conclusion    3. Angiographically minimal CAD 4. Non-ishcemic Cardiomyopathy 5. Systemic Hypertension with Mild-Moderately increase LVEDP after pre-cath hydration. This confirms the presumed diagnosis of Non-ischemic  Cardiomyopathy. Plan:  Discharge home after Bed-rest & TR band removal  Continue current Cardiomyopathy Medications - BB, Entresto & Lasix  Will refer back to Dr. Caryl Comes for reconsideration of ICD.   Echo 02/21/2015 - Challenging image quality. Rhythm is normal sinus. - Left ventricle: The cavity size was normal. There was mild concentric hypertrophy. Systolic function was normal. EF ~50% to 55%.  Unable to exclude inferior-posterior wall hypokinesis. Left ventricular diastolic function parameters were normal. - Mitral valve: There was mild regurgitation. - Left atrium: The atrium was mildly dilated. - Right ventricle: Systolic function was normal. - Pulmonary arteries: Systolic pressure was within the normal  range.   Interval History:  [name] presents today doing quite well from a cardiac standpoint. He has no major complaints.  He has not had any further episodes of rapid irregular heartbeats since his cardioversion. He denies any PND, orthopnea or edema to suggest recurrent heart failure. He has less dizziness now. He does not do routine exercise, but is active. He is gaining extra weight though endoscopic concerned about that. No myalgias or arthralgias on high-dose Lipitor.  The remainder of cardiac review of systems is as follows: Cardiovascular ROS: no chest pain or dyspnea on exertion negative for - loss of consciousness, orthopnea, palpitations, paroxysmal nocturnal dyspnea, rapid heart rate, shortness of breath or TIA/amaurosis fugax, syncope/near syncope :   Past Medical History  Diagnosis Date  . Hyperlipidemia   . Elevated troponin     Chronically elevated. - level was 0.17-0.10 during recent admission  . 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 No inducible ischemia, fixed inferoapical defect = Infarct vs.artifact (not commensurate with  extent of reduced EF); b) Echo 8/'12: Mild LV dilation, Mod-Severe global  hypokinesis - EF 25-35%. Mild-moderate RV dilation no significant bowel lesion mild-moderate MR; c) Echo Jan 2016: EF 20-25%, high LAP, mild-moderate LV dilation, severe L  . Chronic combined systolic and diastolic heart failure, NYHA class 2 ([place])     a. see above  . Alcohol abuse   . Essential hypertension   . CKD (chronic kidney disease), stage III   . GI bleed 11/2013  . Myocardial infarct ([place]) 2004  . Pancreatitis   . Atrial flutter ([place])     a. new onset 07/2014; b. s/p unsuccessful TEE/DCCV 08/16/2014; c. on apixaban   . Atrial fibrillation ([place])     a. new onset s/p unsuccessful TEE/DCCV on 08/16/2014; b. on apixaban   . Sleep-disordered breathing     Has yet to have a sleep study    Prior Cardiac Evaluation and Past Surgical History: Past Surgical History  Procedure Laterality Date  . Knee surgery Right   . Nm myoview ltd  November 2011    No ischemia or infarction. EF 50-55% ( no improvement from 2009 Myoview EF of 28%  . Tee without cardioversion N/A 08/16/2014    Procedure: TRANSESOPHAGEAL ECHOCARDIOGRAM (TEE);  Surgeon: [name], MD;  Location: [facility];  Service: Cardiovascular;  Laterality: N/A;  . Electrophysiologic study N/A 08/16/2014    Procedure: CARDIOVERSION;  Surgeon: [name], MD;  Location: [facility];  Service: Cardiovascular;  Laterality: N/A;  . Cardiac catheterization N/A 01/09/2015    Procedure: Left Heart Cath and Coronary Angiography;  Surgeon: [name], MD;  Location: [place] [facility];  Service: Cardiovascular;  Laterality: N/A;    Allergies  Allergen Reactions  . Eggs Or Egg-Derived Products Rash    Current Outpatient Prescriptions  Medication Sig Dispense Refill  . amiodarone (PACERONE) 200 MG tablet Take 1 tablet (200 mg total) by mouth daily. 60 tablet 3  . apixaban (ELIQUIS) 5 MG TABS tablet Take 1 tablet (5 mg total) by mouth 2 (two) times daily. 60 tablet 5  . atorvastatin (LIPITOR) 80 MG tablet Take 1 tablet (80 mg  total) by mouth daily. 30 tablet 5  . carvedilol (COREG) 12.5 MG tablet Take 1 tablet (12.5 mg total) by mouth 2 (two) times daily. Take 1/2 tablet in the morning and 1 tablet in the evening. 30 tablet 3  . furosemide (LASIX) 40 MG tablet Take 1 tablet (40 mg total) by mouth daily. 30 tablet 5  . omeprazole (PRILOSEC) 20 MG capsule Take 1 capsule (20 mg total) by mouth daily. 30 capsule 5  . sacubitril-valsartan (ENTRESTO) 49-51 MG Take 1 tablet by mouth 2 (two) times daily. 60 tablet 3   No current facility-administered medications for this visit.   Social History   Social History  . Marital Status: Single    Spouse Name: N/A  . Number of Children: N/A  . Years of Education: N/A   Occupational History  . Not on file.   Social History Main Topics  . Smoking status: Former Smoker -- 1.00 packs/day for 12 years    Quit date: 02/23/1998  . Smokeless tobacco: Never Used  . Alcohol Use: No     Comment: 4 heavy alcohol drinker. Quit 2 years ago.  . Drug Use: No  . Sexual Activity: Not on file   Other Topics Concern  . Not on file   Social History Narrative    family history includes Diabetes in  his mother; Hyperlipidemia in his mother; Hypertension in his mother.  ROS: A comprehensive Review of Systems - was performed Review of Systems  Constitutional: Positive for malaise/fatigue.  HENT: Negative for nosebleeds.   Eyes: Negative for blurred vision.  Respiratory: Positive for shortness of breath (Only with significant exertion). Negative for wheezing (Occasional).   Cardiovascular: Negative for claudication.  Gastrointestinal: Negative for heartburn, blood in stool and melena.  Genitourinary: Negative for hematuria.  Musculoskeletal: Positive for joint pain (Bilateral knee).  Neurological: Negative for dizziness.  Endo/Heme/Allergies: Does not bruise/bleed easily.  Psychiatric/Behavioral: Negative.   All other systems reviewed and are negative.  Wt Readings from Last 3  Encounters:  05/22/15 294 lb (133.358 kg)  04/01/15 292 lb (132.45 kg)  01/29/15 286 lb 4 oz (129.842 kg)    PHYSICAL EXAM BP 122/90 mmHg  Pulse 46  Ht 5\' 11"  (1.803 m)  Wt 294 lb (133.358 kg)  BMI 41.02 kg/m2 General appearance: alert, cooperative, appears stated age, no distress, moderately obese; pleasant mood and affect. Neck: JVD - 2 cm above sternal notch, no adenopathy, no carotid bruit, supple, symmetrical, trachea midline and thyroid not enlarged, symmetric, no tenderness/mass/nodules Lungs: normal percussion bilaterally and Diffuse interstitial sounds, no rhonchi or rales. Nonlabored. Heart: RR - bradycardic, normal S1, S2 with positive S4. Slightly lateral PMI, but nonsustained; 2/6 HSM at apex. High pitched blowing. Abdomen: soft, non-tender; bowel sounds normal; no masses,  no organomegaly and Obese Extremities: Trace lower extremity edema. No clubbing cyanosis Pulses: 2+ and symmetric Neurologic: Mental status: Alert, oriented, thought content appropriate, affect: normal Cranial nerves: normal   Adult ECG Report  Rate: 46;  Rhythm: Marked sinus bradycardia, Left Axis Deviation (-30)  QRS Axis: -30 LAD ;  PR Interval: 190;  QRS Duration: 108; QTc: 485,  Voltages: normal; nonspecific T wave changes with T wave inversions in 1 and aVL. Poor progression in precordial leads nonspecific.    Narrative Interpretation: No significant change from last EKG.  Recent Labs:  Lab Results  Component Value Date   CREATININE 2.29* 01/29/2015   Lab Results  Component Value Date   K 4.4 01/29/2015   Lab Results  Component Value Date   WBC 6.2 01/02/2015   HGB 15.0 01/02/2015   HCT 46.1 01/02/2015   MCV 86.9 01/02/2015   PLT 173 01/02/2015    ASSESSMENT / PLAN: Pleasant gentleman with long-standing cardiomyopathy.  Problem List Items Addressed This Visit    Sinus bradycardia    Reduce daytime dose of carvedilol to 6.25 from 12.5.      Relevant Medications   amiodarone  (PACERONE) 200 MG tablet   furosemide (LASIX) 40 MG tablet   carvedilol (COREG) 12.5 MG tablet   Paroxysmal atrial flutter (HCC) (Chronic)    Maintaining sinus rhythm on amiodarone following cardioversion.      Relevant Medications   amiodarone (PACERONE) 200 MG tablet   furosemide (LASIX) 40 MG tablet   carvedilol (COREG) 12.5 MG tablet   Paroxysmal atrial fibrillation (HCC) - Primary (Chronic)    Maintaining sinus rhythm on amiodarone. Will require routine PFTs and TSH as well as LFTs.      Relevant Medications   amiodarone (PACERONE) 200 MG tablet   furosemide (LASIX) 40 MG tablet   carvedilol (COREG) 12.5 MG tablet   Other Relevant Orders   EKG 12-Lead (Completed)   TSH (Completed)   Nonischemic cardiomyopathy (HCC) (Chronic)    Essentially resolved now. On stable medications. EF now near normal  Relevant Medications   amiodarone (PACERONE) 200 MG tablet   furosemide (LASIX) 40 MG tablet   carvedilol (COREG) 12.5 MG tablet   Hypertensive heart disease    No longer on clonidine. Now on Entresto plus carvedilol. Continue to monitor pressures, may require up titration of Entresto.      Relevant Medications   amiodarone (PACERONE) 200 MG tablet   furosemide (LASIX) 40 MG tablet   carvedilol (COREG) 12.5 MG tablet   Hyperlipidemia (Chronic)    On high-dose statin. HDL remains low, but otherwise lipids looked great on last check. Due for another check now. Will recheck lipids along with LFTs and TSH.      Relevant Medications   amiodarone (PACERONE) 200 MG tablet   furosemide (LASIX) 40 MG tablet   carvedilol (COREG) 12.5 MG tablet   Essential hypertension (Chronic)    Well-controlled blood pressure. Diastolic pressures little bit elevated but systolic is at target.      Relevant Medications   amiodarone (PACERONE) 200 MG tablet   furosemide (LASIX) 40 MG tablet   carvedilol (COREG) 12.5 MG tablet   Chronic combined systolic and diastolic heart failure, NYHA  class 2 (HCC) (Chronic)    Unexpectedly, his EF has improved to near normal levels after storing sinus rhythm and treated with Entresto and carvedilol. He is on standing dose of Lasix and has not required additional dosing.  For now will continue current management to prevent worsening function.      Relevant Medications   amiodarone (PACERONE) 200 MG tablet   furosemide (LASIX) 40 MG tablet   carvedilol (COREG) 12.5 MG tablet    Other Visit Diagnoses    Hyperlipemia        Relevant Medications    amiodarone (PACERONE) 200 MG tablet    furosemide (LASIX) 40 MG tablet    carvedilol (COREG) 12.5 MG tablet    Other Relevant Orders    Hepatic function panel (Completed)    Lipid Profile (Completed)       Meds ordered this encounter  Medications  . amiodarone (PACERONE) 200 MG tablet    Sig: Take 1 tablet (200 mg total) by mouth daily.    Dispense:  60 tablet    Refill:  3  . furosemide (LASIX) 40 MG tablet    Sig: Take 1 tablet (40 mg total) by mouth daily.    Dispense:  30 tablet    Refill:  5  . omeprazole (PRILOSEC) 20 MG capsule    Sig: Take 1 capsule (20 mg total) by mouth daily.    Dispense:  30 capsule    Refill:  5  . DISCONTD: carvedilol (COREG) 12.5 MG tablet    Sig: Take 1 tablet (12.5 mg total) by mouth 2 (two) times daily. Take 1/2 tablet in the morning and 1 tablet in the evening.    Dispense:  30 tablet    Refill:  3  . carvedilol (COREG) 12.5 MG tablet    Sig: Take 1 tablet (12.5 mg total) by mouth 2 (two) times daily. Take 1/2 tablet in the morning and 1 tablet in the evening.    Dispense:  30 tablet    Refill:  3    Followup: 6 months    Akila Batta W, M.D., M.S.  [employer]  [address] [place], [place] [address] [phone] Fax [phone]

## 2015-05-22 NOTE — Patient Instructions (Signed)
Medication Instructions:  Your physician has recommended you make the following change in your medication:  DECREASE morning dose of coreg to 6.25mg  (1/2 tablet)   Labwork: Lipid, liver, TSH today  Testing/Procedures: Pulmonary function test.  Follow-Up: Your physician wants you to follow-up in: six months with Dr. Ellyn Hack.  You will receive a reminder letter in the mail two months in advance. If you don't receive a letter, please call our office to schedule the follow-up appointment.   Any Other Special Instructions Will Be Listed Below (If Applicable).     If you need a refill on your cardiac medications before your next appointment, please call your pharmacy.

## 2015-05-23 ENCOUNTER — Telehealth: Payer: Self-pay | Admitting: *Deleted

## 2015-05-23 ENCOUNTER — Telehealth: Payer: Self-pay | Admitting: Cardiology

## 2015-05-23 DIAGNOSIS — Z79899 Other long term (current) drug therapy: Secondary | ICD-10-CM

## 2015-05-23 LAB — HEPATIC FUNCTION PANEL
ALBUMIN: 3.9 g/dL (ref 3.5–5.5)
ALT: 17 IU/L (ref 0–44)
AST: 24 IU/L (ref 0–40)
Alkaline Phosphatase: 73 IU/L (ref 39–117)
BILIRUBIN TOTAL: 0.5 mg/dL (ref 0.0–1.2)
Bilirubin, Direct: 0.23 mg/dL (ref 0.00–0.40)
TOTAL PROTEIN: 6.4 g/dL (ref 6.0–8.5)

## 2015-05-23 LAB — LIPID PANEL
CHOL/HDL RATIO: 5.8 ratio — AB (ref 0.0–5.0)
Cholesterol, Total: 216 mg/dL — ABNORMAL HIGH (ref 100–199)
HDL: 37 mg/dL — AB (ref >39)
LDL CALC: 139 mg/dL — AB (ref 0–99)
TRIGLYCERIDES: 198 mg/dL — AB (ref 0–149)
VLDL Cholesterol Cal: 40 mg/dL (ref 5–40)

## 2015-05-23 LAB — TSH: TSH: 10.77 u[IU]/mL — ABNORMAL HIGH (ref 0.450–4.500)

## 2015-05-23 NOTE — Telephone Encounter (Signed)
Pharmacy calling needing to verify Coreg instructions and quality Please advise

## 2015-05-23 NOTE — Telephone Encounter (Signed)
Please review from Dr. Ellyn Hack yesterday. Thanks!

## 2015-05-23 NOTE — Telephone Encounter (Signed)
S/w Elta Guadeloupe, Pharmacist, to clarify coreg dosage. Pt to take 1/2 tablet (6.25mg ) in the morning and 1 tablet (12.5mg ) in the evening.   Elta Guadeloupe verbalized understanding with no further questions.

## 2015-05-23 NOTE — Telephone Encounter (Signed)
Order placed for PFT.  Nothing further needed. 

## 2015-05-26 ENCOUNTER — Encounter: Payer: Self-pay | Admitting: Cardiology

## 2015-05-26 DIAGNOSIS — I428 Other cardiomyopathies: Secondary | ICD-10-CM | POA: Insufficient documentation

## 2015-05-26 NOTE — Assessment & Plan Note (Signed)
Essentially resolved now. On stable medications. EF now near normal

## 2015-05-26 NOTE — Assessment & Plan Note (Signed)
No longer on clonidine. Now on Entresto plus carvedilol. Continue to monitor pressures, may require up titration of Entresto.

## 2015-05-26 NOTE — Assessment & Plan Note (Addendum)
On high-dose statin. HDL remains low, but otherwise lipids looked great on last check. Due for another check now. Will recheck lipids along with LFTs and TSH.

## 2015-05-26 NOTE — Assessment & Plan Note (Signed)
Maintaining sinus rhythm on amiodarone following cardioversion.

## 2015-05-26 NOTE — Assessment & Plan Note (Signed)
Reduce daytime dose of carvedilol to 6.25 from 12.5.

## 2015-05-26 NOTE — Assessment & Plan Note (Signed)
Well-controlled blood pressure. Diastolic pressures little bit elevated but systolic is at target.

## 2015-05-26 NOTE — Assessment & Plan Note (Signed)
Maintaining sinus rhythm on amiodarone. Will require routine PFTs and TSH as well as LFTs.

## 2015-05-26 NOTE — Assessment & Plan Note (Signed)
Unexpectedly, his EF has improved to near normal levels after storing sinus rhythm and treated with Entresto and carvedilol. He is on standing dose of Lasix and has not required additional dosing.  For now will continue current management to prevent worsening function.

## 2015-06-04 ENCOUNTER — Encounter: Payer: Self-pay | Admitting: *Deleted

## 2015-06-18 ENCOUNTER — Telehealth: Payer: Self-pay | Admitting: *Deleted

## 2015-06-18 ENCOUNTER — Other Ambulatory Visit: Payer: Self-pay

## 2015-06-18 MED ORDER — LEVOTHYROXINE SODIUM 50 MCG PO TABS: 50.0000 ug | ORAL_TABLET | Freq: Every day | ORAL | Status: DC

## 2015-06-18 NOTE — Telephone Encounter (Signed)
SENT TO Alysia Penna RN

## 2015-06-18 NOTE — Telephone Encounter (Signed)
-----   Message from [name], MD sent at 06/03/2015  9:49 PM EDT ----- Thanks Dr. Caryl Comes.  Hector [name] = can we start on 50 mcg Synthroid & forward to PCP to help follow.  [name], MD   ----- Message -----    From: Deboraha Sprang, MD    Sent: 06/03/2015   6:25 PM      To: [name], MD  [name], I would probably just put him on a little bit of thyroid replacement and leave him on amiodarone ----- Message -----    From: [name], MD    Sent: 05/24/2015   7:15 AM      To: [name], NP, Deboraha Sprang, MD, #  So - the lipid panel actually is more consistent with 1 yr ago than last fall.  Need to stay on statin.  Hopefully with diet & wgt loss, this will improve. LFTs look good.  TSH has gone up - suggests Hypothyroidism.  This could be related to Amiodarone.  Will ask Dr. [name] opinion on whether or not we continue Amiodarone. Will need Thyroid replacement Rx - probably better handled by PCP.  [name], MD

## 2015-06-18 NOTE — Telephone Encounter (Signed)
S/w pt regarding MD recommendation to add synthroid 74mcg daily. Pt verbalized understanding and is agreeable w/plan.  He requests prescription be submitted to [facility]. Prescription submitted.

## 2015-06-19 NOTE — Telephone Encounter (Signed)
New medication  Information faxed/routed to [name], NP at [facility].

## 2015-07-01 ENCOUNTER — Encounter: Payer: Self-pay | Admitting: Family

## 2015-07-01 ENCOUNTER — Ambulatory Visit: Payer: Medicaid Other | Attending: Family | Admitting: Family

## 2015-07-01 VITALS — BP 144/108 | HR 58 | Resp 20 | Ht 71.0 in | Wt 294.0 lb

## 2015-07-01 DIAGNOSIS — I110 Hypertensive heart disease with heart failure: Secondary | ICD-10-CM | POA: Diagnosis not present

## 2015-07-01 DIAGNOSIS — E785 Hyperlipidemia, unspecified: Secondary | ICD-10-CM | POA: Insufficient documentation

## 2015-07-01 DIAGNOSIS — Z87891 Personal history of nicotine dependence: Secondary | ICD-10-CM | POA: Diagnosis not present

## 2015-07-01 DIAGNOSIS — I129 Hypertensive chronic kidney disease with stage 1 through stage 4 chronic kidney disease, or unspecified chronic kidney disease: Secondary | ICD-10-CM | POA: Insufficient documentation

## 2015-07-01 DIAGNOSIS — Z79899 Other long term (current) drug therapy: Secondary | ICD-10-CM | POA: Diagnosis not present

## 2015-07-01 DIAGNOSIS — I426 Alcoholic cardiomyopathy: Secondary | ICD-10-CM | POA: Diagnosis not present

## 2015-07-01 DIAGNOSIS — Z888 Allergy status to other drugs, medicaments and biological substances status: Secondary | ICD-10-CM | POA: Insufficient documentation

## 2015-07-01 DIAGNOSIS — N183 Chronic kidney disease, stage 3 (moderate): Secondary | ICD-10-CM | POA: Insufficient documentation

## 2015-07-01 DIAGNOSIS — I5032 Chronic diastolic (congestive) heart failure: Secondary | ICD-10-CM | POA: Diagnosis not present

## 2015-07-01 DIAGNOSIS — I4892 Unspecified atrial flutter: Secondary | ICD-10-CM | POA: Diagnosis not present

## 2015-07-01 DIAGNOSIS — Z9889 Other specified postprocedural states: Secondary | ICD-10-CM | POA: Insufficient documentation

## 2015-07-01 DIAGNOSIS — I2510 Atherosclerotic heart disease of native coronary artery without angina pectoris: Secondary | ICD-10-CM | POA: Insufficient documentation

## 2015-07-01 DIAGNOSIS — Z7901 Long term (current) use of anticoagulants: Secondary | ICD-10-CM | POA: Diagnosis not present

## 2015-07-01 DIAGNOSIS — I4891 Unspecified atrial fibrillation: Secondary | ICD-10-CM | POA: Insufficient documentation

## 2015-07-01 DIAGNOSIS — I10 Essential (primary) hypertension: Secondary | ICD-10-CM

## 2015-07-01 DIAGNOSIS — R001 Bradycardia, unspecified: Secondary | ICD-10-CM | POA: Insufficient documentation

## 2015-07-01 MED ORDER — SACUBITRIL-VALSARTAN 97-103 MG PO TABS: 1.0000 | ORAL_TABLET | Freq: Two times a day (BID) | ORAL | Status: DC

## 2015-07-01 NOTE — Progress Notes (Signed)
Subjective:    [name], male    DOB: 03-31-1965, 50 y.o.   MRN: [id number]  Congestive Heart Failure Presents for follow-up visit. The disease course has been improving. Pertinent negatives include no abdominal pain, chest pain, chest pressure, edema, fatigue, orthopnea, palpitations, shortness of breath or unexpected weight change. The symptoms have been improving. Past treatments include angiotensin receptor blockers, beta blockers and salt and fluid restriction. The treatment provided significant relief. Compliance with prior treatments has been good. His past medical history is significant for arrhythmia, CAD and HTN. He has one 1st degree relative with heart disease.  Hypertension This is a chronic problem. The current episode started more than 1 year ago. The problem has been waxing and waning since onset. Pertinent negatives include no chest pain, headaches, neck pain, palpitations, peripheral edema or shortness of breath. Agents associated with hypertension include thyroid hormones. Risk factors for coronary artery disease include family history, dyslipidemia, obesity and male gender. Past treatments include beta blockers, angiotensin blockers, diuretics and lifestyle changes. The current treatment provides moderate improvement. Compliance problems include exercise.  Hypertensive end-organ damage includes kidney disease, CAD/MI and heart failure.   Past Medical History  Diagnosis Date  . Hyperlipidemia   . Elevated troponin     Chronically elevated. - level was 0.17-0.10 during recent admission  . 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 No inducible ischemia, fixed inferoapical defect = Infarct vs.artifact (not commensurate with extent of reduced EF); b) Echo 8/'12: Mild LV dilation, Mod-Severe global hypokinesis - EF 25-35%. Mild-moderate RV dilation no significant bowel lesion mild-moderate MR; c) Echo Jan  2016: EF 20-25%, high LAP, mild-moderate LV dilation, severe L  . Chronic combined systolic and diastolic heart failure, NYHA class 2 ([place])     a. see above  . Alcohol abuse   . Essential hypertension   . CKD (chronic kidney disease), stage III   . GI bleed 11/2013  . Myocardial infarct ([place]) 2004  . Pancreatitis   . Atrial flutter ([place])     a. new onset 07/2014; b. s/p unsuccessful TEE/DCCV 08/16/2014; c. on apixaban   . Atrial fibrillation ([place])     a. new onset s/p unsuccessful TEE/DCCV on 08/16/2014; b. on apixaban   . Sleep-disordered breathing     Has yet to have a sleep study    Past Surgical History  Procedure Laterality Date  . Knee surgery Right   . Nm myoview ltd  November 2011    No ischemia or infarction. EF 50-55% ( no improvement from 2009 Myoview EF of 28%  . Tee without cardioversion N/A 08/16/2014    Procedure: TRANSESOPHAGEAL ECHOCARDIOGRAM (TEE);  Surgeon: [name], MD;  Location: [facility] ORS;  Service: Cardiovascular;  Laterality: N/A;  . Electrophysiologic study N/A 08/16/2014    Procedure: CARDIOVERSION;  Surgeon: [name], MD;  Location: [facility] ORS;  Service: Cardiovascular;  Laterality: N/A;  . Cardiac catheterization N/A 01/09/2015    Procedure: Left Heart Cath and Coronary Angiography;  Surgeon: [name], MD;  Location: [place] [facility];  Service: Cardiovascular;  Laterality: N/A;    Family History  Problem Relation Age of Onset  . Hypertension Mother   . Hyperlipidemia Mother   . Diabetes Mother     Social History  Substance Use Topics  . Smoking status: Former Smoker -- 1.00 packs/day for 12 years    Quit date: 02/23/1998  . Smokeless  tobacco: Never Used  . Alcohol Use: No     Comment: 4 heavy alcohol drinker. Quit 2 years ago.    Allergies  Allergen Reactions  . Eggs Or Egg-Derived Products Rash    Prior to Admission medications   Medication Sig Start Date End Date Taking? Authorizing Provider  amiodarone (PACERONE)  200 MG tablet Take 1 tablet (200 mg total) by mouth daily. 05/22/15  Yes [name], MD  apixaban (ELIQUIS) 5 MG TABS tablet Take 1 tablet (5 mg total) by mouth 2 (two) times daily. 09/12/14  Yes Alisa Graff, FNP  atorvastatin (LIPITOR) 80 MG tablet Take 1 tablet (80 mg total) by mouth daily. 09/12/14  Yes Alisa Graff, FNP  carvedilol (COREG) 12.5 MG tablet Take 12.5 mg by mouth 2 (two) times daily with a meal. Currently taking 1/2 tablet in AM & 1 tablet in PM   Yes Historical Provider, MD  furosemide (LASIX) 40 MG tablet Take 1 tablet (40 mg total) by mouth daily. 05/22/15  Yes [name], MD  levothyroxine (SYNTHROID) 50 MCG tablet Take 1 tablet (50 mcg total) by mouth daily before breakfast. 06/18/15  Yes [name], MD  omeprazole (PRILOSEC) 20 MG capsule Take 1 capsule (20 mg total) by mouth daily. 05/22/15  Yes [name], MD  sacubitril-valsartan (ENTRESTO) 97-103 MG Take 1 tablet by mouth 2 (two) times daily. 07/01/15   Alisa Graff, FNP     Review of Systems  Constitutional: Negative for appetite change, fatigue and unexpected weight change.  HENT: Negative for congestion, postnasal drip and sore throat.   Eyes: Negative.   Respiratory: Negative for cough, chest tightness and shortness of breath.   Cardiovascular: Negative for chest pain, palpitations and leg swelling.  Gastrointestinal: Negative for abdominal pain and abdominal distention.  Endocrine: Negative.   Genitourinary: Negative.   Musculoskeletal: Negative for back pain and neck pain.  Skin: Negative.   Allergic/Immunologic: Negative.   Neurological: Negative for dizziness, light-headedness and headaches.  Hematological: Negative for adenopathy. Does not bruise/bleed easily.  Psychiatric/Behavioral: Negative for sleep disturbance (sleeping on 2 pillows ) and dysphoric mood. The patient is not nervous/anxious.        Objective:   Physical Exam  Constitutional: He is oriented to person, place, and  time. He appears well-developed and well-nourished.  HENT:  Head: Normocephalic and atraumatic.  Eyes: Conjunctivae are normal. Pupils are equal, round, and reactive to light.  Neck: Normal range of motion. Neck supple.  Cardiovascular: Regular rhythm.  Bradycardia present.   Pulmonary/Chest: Effort normal. He has no wheezes. He has no rales.  Abdominal: Soft. He exhibits no distension. There is no tenderness.  Musculoskeletal: He exhibits no edema or tenderness.  Neurological: He is alert and oriented to person, place, and time.  Skin: Skin is warm and dry.  Psychiatric: He has a normal mood and affect. His behavior is normal. Thought content normal.  Nursing note and vitals reviewed.  BP 144/108 mmHg  Pulse 58  Resp 20  Ht 5\' 11"  (1.803 m)  Wt 294 lb (133.358 kg)  BMI 41.02 kg/m2  SpO2 96%        Assessment & Plan:  1: Chronic heart failure with preserved ejection fraction- Patient presents without any fatigue or shortness of breath at this time (Class I). He denies any swelling in his legs or his abdomen. He continues to weigh himself daily and says that he continues to have a gradual weight gain. He does  admit to "eating too much" and confirms that he eats late at night. He works 3-11:30pm at a [employer]. By our scale, he's gained 2 pounds since he was last here on 04/01/15 but has gained 30 pounds since May 2016. He does walk to and from work seven days a week which would be 1/2 mile round trip. Encouraged him to get some more walking in and to try not to eat late at night after getting home. Reminded to call for an overnight weight gain of >2 pounds or a weekly weight gain of >5 pounds. He is not adding any salt to his food and tries to eat low sodium foods. [facility] Pharm[facility] went in and reviewed medications with the patient.  2: HTN- Blood pressure elevated today so will increase his entresto to the 97/103mg  dose. He just got his current bottle filled on 06/24/15 so he was instructed to  finish out his current 49/51mg  dose until the bottle is empty. Once the bottle is empty, he's to get the 97/103mg  dose filled and begin taking that twice daily. Patient verbalized understanding and written instructions were also provided. Will plan on checking a basic metabolic panel at his next visit. 3: Bradycardia- Heart rate is in the upper 50's which is an improvement from the 40's when he saw his cardiologist. His carvedilol had been decreased to 6.25mg  in the AM and 12.5mg  in the PM.   Medication bottles were reviewed with the patient.  Return here in 2 months or sooner for any questions/problems before then.

## 2015-07-01 NOTE — Patient Instructions (Addendum)
Continue weighing daily and call for an overnight weight gain of > 2 pounds or a weekly weight gain of >5 pounds.  Finish taking the current bottle of entresto 49/51mg . Once the bottle is finished, you will then begin the higher dose of Entresto 97/103mg  and take it twice a day to hopefully help decrease your blood pressure.

## 2015-08-22 ENCOUNTER — Ambulatory Visit: Payer: Medicaid Other | Admitting: Family

## 2015-08-26 ENCOUNTER — Ambulatory Visit: Payer: Medicaid Other | Admitting: Family

## 2015-09-03 ENCOUNTER — Ambulatory Visit: Payer: Medicaid Other | Attending: Family | Admitting: Family

## 2015-09-03 ENCOUNTER — Encounter: Payer: Self-pay | Admitting: Family

## 2015-09-03 VITALS — BP 160/116 | HR 69 | Resp 18 | Ht 71.0 in | Wt 293.0 lb

## 2015-09-03 DIAGNOSIS — I4891 Unspecified atrial fibrillation: Secondary | ICD-10-CM | POA: Insufficient documentation

## 2015-09-03 DIAGNOSIS — Z87891 Personal history of nicotine dependence: Secondary | ICD-10-CM | POA: Insufficient documentation

## 2015-09-03 DIAGNOSIS — I5032 Chronic diastolic (congestive) heart failure: Secondary | ICD-10-CM | POA: Insufficient documentation

## 2015-09-03 DIAGNOSIS — I480 Paroxysmal atrial fibrillation: Secondary | ICD-10-CM

## 2015-09-03 DIAGNOSIS — I252 Old myocardial infarction: Secondary | ICD-10-CM | POA: Insufficient documentation

## 2015-09-03 DIAGNOSIS — Z6841 Body Mass Index (BMI) 40.0 and over, adult: Secondary | ICD-10-CM | POA: Insufficient documentation

## 2015-09-03 DIAGNOSIS — I10 Essential (primary) hypertension: Secondary | ICD-10-CM

## 2015-09-03 DIAGNOSIS — Z7901 Long term (current) use of anticoagulants: Secondary | ICD-10-CM | POA: Insufficient documentation

## 2015-09-03 DIAGNOSIS — E669 Obesity, unspecified: Secondary | ICD-10-CM | POA: Insufficient documentation

## 2015-09-03 DIAGNOSIS — E785 Hyperlipidemia, unspecified: Secondary | ICD-10-CM | POA: Insufficient documentation

## 2015-09-03 DIAGNOSIS — N183 Chronic kidney disease, stage 3 (moderate): Secondary | ICD-10-CM | POA: Insufficient documentation

## 2015-09-03 DIAGNOSIS — I130 Hypertensive heart and chronic kidney disease with heart failure and stage 1 through stage 4 chronic kidney disease, or unspecified chronic kidney disease: Secondary | ICD-10-CM | POA: Insufficient documentation

## 2015-09-03 DIAGNOSIS — I2510 Atherosclerotic heart disease of native coronary artery without angina pectoris: Secondary | ICD-10-CM | POA: Insufficient documentation

## 2015-09-03 NOTE — Progress Notes (Signed)
Subjective:    [name], male    DOB: [birth year], 50 y.o.   MRN: [id number]  Congestive Heart Failure Presents for follow-up visit. The disease course has been stable. Pertinent negatives include no abdominal pain, chest pain, chest pressure, edema, fatigue, orthopnea, palpitations or shortness of breath. The symptoms have been stable. Past treatments include angiotensin receptor blockers, beta blockers and salt and fluid restriction. The treatment provided moderate relief. Compliance with prior treatments has been good. His past medical history is significant for arrhythmia, CAD and HTN.  Hypertension This is a chronic problem. The current episode started more than 1 year ago. The problem has been waxing and waning since onset. Pertinent negatives include no chest pain, headaches, neck pain, palpitations, peripheral edema or shortness of breath. There are no associated agents to hypertension. Risk factors for coronary artery disease include obesity, male gender and sedentary lifestyle. Past treatments include angiotensin blockers, beta blockers, diuretics and lifestyle changes. The current treatment provides moderate improvement. Compliance problems include diet.  Hypertensive end-organ damage includes kidney disease, CAD/MI and heart failure.    Past Medical History  Diagnosis Date  . Hyperlipidemia   . Elevated troponin     Chronically elevated. - level was 0.17-0.10 during recent admission  . 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 No inducible ischemia, fixed inferoapical defect = Infarct vs.artifact (not commensurate with extent of reduced EF); b) Echo 8/'12: Mild LV dilation, Mod-Severe global hypokinesis - EF 25-35%. Mild-moderate RV dilation no significant bowel lesion mild-moderate MR; c) Echo Jan 2016: EF 20-25%, high LAP, mild-moderate LV dilation, severe L  . Chronic combined systolic and diastolic  heart failure, NYHA class 2 ([place])     a. see above  . Alcohol abuse   . Essential hypertension   . CKD (chronic kidney disease), stage III   . GI bleed 11/2013  . Myocardial infarct ([place]) 2004  . Pancreatitis   . Atrial flutter ([place])     a. new onset 07/2014; b. s/p unsuccessful TEE/DCCV 08/16/2014; c. on apixaban   . Atrial fibrillation ([place])     a. new onset s/p unsuccessful TEE/DCCV on 08/16/2014; b. on apixaban   . Sleep-disordered breathing     Has yet to have a sleep study    Past Surgical History  Procedure Laterality Date  . Knee surgery Right   . Nm myoview ltd  November 2011    No ischemia or infarction. EF 50-55% ( no improvement from 2009 Myoview EF of 28%  . Tee without cardioversion N/A 08/16/2014    Procedure: TRANSESOPHAGEAL ECHOCARDIOGRAM (TEE);  Surgeon: [name], MD;  Location: [facility] ORS;  Service: Cardiovascular;  Laterality: N/A;  . Electrophysiologic study N/A 08/16/2014    Procedure: CARDIOVERSION;  Surgeon: [name], MD;  Location: [facility] ORS;  Service: Cardiovascular;  Laterality: N/A;  . Cardiac catheterization N/A 01/09/2015    Procedure: Left Heart Cath and Coronary Angiography;  Surgeon: [name], MD;  Location: [place] [facility];  Service: Cardiovascular;  Laterality: N/A;    Family History  Problem Relation Age of Onset  . Hypertension Mother   . Hyperlipidemia Mother   . Diabetes Mother     Social History  Substance Use Topics  . Smoking status: Former Smoker -- 1.00 packs/day for 12 years    Quit date: 02/23/1998  . Smokeless tobacco: Never Used  . Alcohol Use: No  Comment: 4 heavy alcohol drinker. Quit 2 years ago.    Allergies  Allergen Reactions  . Eggs Or Egg-Derived Products Rash    Prior to Admission medications   Medication Sig Start Date End Date Taking? Authorizing Provider  amiodarone (PACERONE) 200 MG tablet Take 1 tablet (200 mg total) by mouth daily. 05/22/15  Yes [name], MD  apixaban  (ELIQUIS) 5 MG TABS tablet Take 1 tablet (5 mg total) by mouth 2 (two) times daily. 09/12/14  Yes Alisa Graff, FNP  atorvastatin (LIPITOR) 80 MG tablet Take 1 tablet (80 mg total) by mouth daily. 09/12/14  Yes Alisa Graff, FNP  carvedilol (COREG) 12.5 MG tablet Take 12.5 mg by mouth 2 (two) times daily with a meal. Currently taking 1/2 tablet in AM & 1 tablet in PM   Yes Historical Provider, MD  furosemide (LASIX) 40 MG tablet Take 1 tablet (40 mg total) by mouth daily. 05/22/15  Yes [name], MD  levothyroxine (SYNTHROID) 50 MCG tablet Take 1 tablet (50 mcg total) by mouth daily before breakfast. 06/18/15  Yes [name], MD  omeprazole (PRILOSEC) 20 MG capsule Take 1 capsule (20 mg total) by mouth daily. 05/22/15  Yes [name], MD  sacubitril-valsartan (ENTRESTO) 97-103 MG Take 1 tablet by mouth 2 (two) times daily. 07/01/15  Yes Alisa Graff, FNP     Review of Systems  Constitutional: Negative for appetite change and fatigue.  HENT: Negative for congestion, postnasal drip and sore throat.   Eyes: Negative.   Respiratory: Negative for cough, chest tightness and shortness of breath.   Cardiovascular: Negative for chest pain, palpitations and leg swelling.  Gastrointestinal: Negative for abdominal pain and abdominal distention.  Endocrine: Negative.   Genitourinary: Negative.   Musculoskeletal: Negative for back pain and neck pain.  Skin: Negative.   Allergic/Immunologic: Negative.   Neurological: Negative for dizziness, light-headedness and headaches.  Hematological: Negative for adenopathy. Does not bruise/bleed easily.  Psychiatric/Behavioral: Negative for sleep disturbance (sleeping on 2 pillows) and dysphoric mood. The patient is not nervous/anxious.        Objective:   Physical Exam  Constitutional: He is oriented to person, place, and time. He appears well-developed and well-nourished.  HENT:  Head: Normocephalic and atraumatic.  Eyes: Conjunctivae are  normal. Pupils are equal, round, and reactive to light.  Neck: Normal range of motion. Neck supple.  Cardiovascular: Normal rate and regular rhythm.   Pulmonary/Chest: Effort normal. He has no wheezes. He has no rales.  Abdominal: Soft. He exhibits no distension. There is no tenderness.  Musculoskeletal: He exhibits no edema or tenderness.  Neurological: He is alert and oriented to person, place, and time.  Skin: Skin is warm and dry.  Psychiatric: He has a normal mood and affect. His behavior is normal. Thought content normal.  Nursing note and vitals reviewed.   BP 160/116 mmHg  Pulse 69  Resp 18  Ht 5\' 11"  (1.803 m)  Wt 293 lb (132.904 kg)  BMI 40.88 kg/m2  SpO2 96%       Assessment & Plan:  1: Chronic heart failure with preserved ejection fraction- Patient presents without any shortness of breath or fatigue (Class I). He is able to work full-time at [employer] without any difficulty. He says that he's not weighing daily and the importance of weighing daily was discussed. He was also reminded to call for an overnight weight gain of >2 pounds or a weekly weight gain of >5  pounds. By our scale, he's lost 1 pound since he was here last. He admits that he's not getting any exercise and he's eating late when he gets off of work (he works the evening shift). Encouraged him to not eat after he gets home and to try to incorporate some walking before going to work. [facility] PharmD went in and reviewed medications with the patient. 2: HTN- Blood pressure elevated today but he says that he hasn't taken his medication yet today because he has to pick them up at the pharmacy. Instructed him to check his blood pressure outside of here as it's been trending up as he has gained weight. He is to call if his blood pressure doesn't come back down after resuming his medications. 3: Atrial fibrillation- Currently rate controlled at this time. Continues to take amiodarone, carvedilol and apixaban.  Patient did  not bring his medications nor a list. Each medication was verbally reviewed with the patient and he was encouraged to bring the bottles to every visit to confirm accuracy of list.  Return in 3 months or sooner for any questions/problems before then.

## 2015-09-03 NOTE — Patient Instructions (Signed)
Begin weighing daily and call for an overnight weight gain of > 2 pounds or a weekly weight gain of >5 pounds. 

## 2015-10-08 ENCOUNTER — Encounter: Payer: Self-pay | Admitting: Emergency Medicine

## 2015-10-08 ENCOUNTER — Inpatient Hospital Stay
Admission: EM | Admit: 2015-10-08 | Discharge: 2015-10-10 | DRG: 378 | Disposition: A | Discharge status: Home / Self Care | Payer: Medicaid Other | Attending: Specialist | Admitting: Specialist

## 2015-10-08 DIAGNOSIS — Z79899 Other long term (current) drug therapy: Secondary | ICD-10-CM | POA: Diagnosis not present

## 2015-10-08 DIAGNOSIS — K922 Gastrointestinal hemorrhage, unspecified: Secondary | ICD-10-CM | POA: Diagnosis present

## 2015-10-08 DIAGNOSIS — E039 Hypothyroidism, unspecified: Secondary | ICD-10-CM | POA: Diagnosis present

## 2015-10-08 DIAGNOSIS — K644 Residual hemorrhoidal skin tags: Secondary | ICD-10-CM | POA: Diagnosis present

## 2015-10-08 DIAGNOSIS — I5042 Chronic combined systolic (congestive) and diastolic (congestive) heart failure: Secondary | ICD-10-CM | POA: Diagnosis present

## 2015-10-08 DIAGNOSIS — K921 Melena: Principal | ICD-10-CM | POA: Diagnosis present

## 2015-10-08 DIAGNOSIS — I252 Old myocardial infarction: Secondary | ICD-10-CM | POA: Diagnosis not present

## 2015-10-08 DIAGNOSIS — K640 First degree hemorrhoids: Secondary | ICD-10-CM | POA: Diagnosis present

## 2015-10-08 DIAGNOSIS — Z8249 Family history of ischemic heart disease and other diseases of the circulatory system: Secondary | ICD-10-CM

## 2015-10-08 DIAGNOSIS — Z87891 Personal history of nicotine dependence: Secondary | ICD-10-CM

## 2015-10-08 DIAGNOSIS — I482 Chronic atrial fibrillation: Secondary | ICD-10-CM | POA: Diagnosis present

## 2015-10-08 DIAGNOSIS — Z833 Family history of diabetes mellitus: Secondary | ICD-10-CM | POA: Diagnosis not present

## 2015-10-08 DIAGNOSIS — N183 Chronic kidney disease, stage 3 (moderate): Secondary | ICD-10-CM | POA: Diagnosis present

## 2015-10-08 DIAGNOSIS — E785 Hyperlipidemia, unspecified: Secondary | ICD-10-CM | POA: Diagnosis present

## 2015-10-08 DIAGNOSIS — I130 Hypertensive heart and chronic kidney disease with heart failure and stage 1 through stage 4 chronic kidney disease, or unspecified chronic kidney disease: Secondary | ICD-10-CM | POA: Diagnosis present

## 2015-10-08 DIAGNOSIS — Z9889 Other specified postprocedural states: Secondary | ICD-10-CM

## 2015-10-08 LAB — COMPREHENSIVE METABOLIC PANEL
ALBUMIN: 3.6 g/dL (ref 3.5–5.0)
ALT: 24 U/L (ref 17–63)
ANION GAP: 7 (ref 5–15)
AST: 49 U/L — AB (ref 15–41)
Alkaline Phosphatase: 48 U/L (ref 38–126)
BUN: 24 mg/dL — AB (ref 6–20)
CHLORIDE: 109 mmol/L (ref 101–111)
CO2: 24 mmol/L (ref 22–32)
Calcium: 8.4 mg/dL — ABNORMAL LOW (ref 8.9–10.3)
Creatinine, Ser: 2.61 mg/dL — ABNORMAL HIGH (ref 0.61–1.24)
GFR calc Af Amer: 31 mL/min — ABNORMAL LOW (ref >60)
GFR calc non Af Amer: 27 mL/min — ABNORMAL LOW (ref >60)
GLUCOSE: 143 mg/dL — AB (ref 65–99)
POTASSIUM: 4.1 mmol/L (ref 3.5–5.1)
SODIUM: 140 mmol/L (ref 135–145)
Total Bilirubin: 1 mg/dL (ref 0.3–1.2)
Total Protein: 6.9 g/dL (ref 6.5–8.1)

## 2015-10-08 LAB — CBC
HEMATOCRIT: 44.5 % (ref 40.0–52.0)
HEMOGLOBIN: 15.1 g/dL (ref 13.0–18.0)
MCH: 30.2 pg (ref 26.0–34.0)
MCHC: 33.9 g/dL (ref 32.0–36.0)
MCV: 89 fL (ref 80.0–100.0)
Platelets: 208 10*3/uL (ref 150–440)
RBC: 5 MIL/uL (ref 4.40–5.90)
RDW: 14.8 % — ABNORMAL HIGH (ref 11.5–14.5)
WBC: 9 10*3/uL (ref 3.8–10.6)

## 2015-10-08 LAB — OCCULT BLOOD X 1 CARD TO LAB, STOOL: FECAL OCCULT BLD: POSITIVE — AB

## 2015-10-08 LAB — HEMOGLOBIN A1C: Hgb A1c MFr Bld: 6.6 % — ABNORMAL HIGH (ref 4.0–6.0)

## 2015-10-08 LAB — HEMOGLOBIN
HEMOGLOBIN: 13.9 g/dL (ref 13.0–18.0)
HEMOGLOBIN: 13.9 g/dL (ref 13.0–18.0)

## 2015-10-08 LAB — TYPE AND SCREEN
ABO/RH(D): O POS
ANTIBODY SCREEN: NEGATIVE

## 2015-10-08 LAB — PROTIME-INR
INR: 1
Prothrombin Time: 13.2 seconds (ref 11.4–15.2)

## 2015-10-08 LAB — TSH: TSH: 47.715 u[IU]/mL — ABNORMAL HIGH (ref 0.350–4.500)

## 2015-10-08 MED ORDER — ONDANSETRON HCL 4 MG/2ML IJ SOLN
4.0000 mg | Freq: Four times a day (QID) | INTRAMUSCULAR | Status: DC | PRN
  Administered 2015-10-08: 11:00:00 4 mg via INTRAVENOUS
  Filled 2015-10-08: qty 2

## 2015-10-08 MED ORDER — NITROGLYCERIN 2 % TD OINT
0.5000 [in_us] | TOPICAL_OINTMENT | Freq: Four times a day (QID) | TRANSDERMAL | Status: DC
  Administered 2015-10-08 (×3): 0.5 [in_us] via TOPICAL
  Filled 2015-10-08 (×3): qty 1

## 2015-10-08 MED ORDER — FUROSEMIDE 40 MG PO TABS
40.0000 mg | ORAL_TABLET | Freq: Every day | ORAL | Status: DC
  Administered 2015-10-08 – 2015-10-10 (×2): 40 mg via ORAL
  Filled 2015-10-08 (×3): qty 1

## 2015-10-08 MED ORDER — PNEUMOCOCCAL VAC POLYVALENT 25 MCG/0.5ML IJ INJ
0.5000 mL | INJECTION | INTRAMUSCULAR | Status: DC
  Filled 2015-10-08: qty 0.5

## 2015-10-08 MED ORDER — FAMOTIDINE IN NACL 20-0.9 MG/50ML-% IV SOLN
20.0000 mg | Freq: Two times a day (BID) | INTRAVENOUS | Status: DC
  Administered 2015-10-08 – 2015-10-10 (×5): 20 mg via INTRAVENOUS
  Filled 2015-10-08 (×7): qty 50

## 2015-10-08 MED ORDER — HYDROCODONE-ACETAMINOPHEN 5-325 MG PO TABS: 1.0000 | ORAL_TABLET | ORAL | Status: DC | PRN

## 2015-10-08 MED ORDER — AMIODARONE HCL 200 MG PO TABS
200.0000 mg | ORAL_TABLET | Freq: Every day | ORAL | Status: DC
  Administered 2015-10-08 – 2015-10-10 (×2): 200 mg via ORAL
  Filled 2015-10-08 (×3): qty 1

## 2015-10-08 MED ORDER — ACETAMINOPHEN 325 MG PO TABS: 650.0000 mg | ORAL_TABLET | Freq: Four times a day (QID) | ORAL | Status: DC | PRN

## 2015-10-08 MED ORDER — SODIUM CHLORIDE 0.9% FLUSH
3.0000 mL | Freq: Two times a day (BID) | INTRAVENOUS | Status: DC
  Administered 2015-10-08 – 2015-10-10 (×5): 3 mL via INTRAVENOUS

## 2015-10-08 MED ORDER — ATORVASTATIN CALCIUM 20 MG PO TABS
80.0000 mg | ORAL_TABLET | Freq: Every day | ORAL | Status: DC
  Administered 2015-10-08 – 2015-10-09 (×2): 80 mg via ORAL
  Filled 2015-10-08 (×3): qty 4

## 2015-10-08 MED ORDER — ACETAMINOPHEN 650 MG RE SUPP: 650.0000 mg | Freq: Four times a day (QID) | RECTAL | Status: DC | PRN

## 2015-10-08 MED ORDER — LABETALOL HCL 5 MG/ML IV SOLN
5.0000 mg | Freq: Once | INTRAVENOUS | Status: AC
  Administered 2015-10-08: 5 mg via INTRAVENOUS
  Filled 2015-10-08: qty 4

## 2015-10-08 MED ORDER — CARVEDILOL 12.5 MG PO TABS
12.5000 mg | ORAL_TABLET | Freq: Two times a day (BID) | ORAL | Status: DC
  Administered 2015-10-08 – 2015-10-09 (×3): 12.5 mg via ORAL
  Filled 2015-10-08 (×3): qty 1

## 2015-10-08 MED ORDER — ONDANSETRON HCL 4 MG PO TABS: 4.0000 mg | ORAL_TABLET | Freq: Four times a day (QID) | ORAL | Status: DC | PRN

## 2015-10-08 MED ORDER — PANTOPRAZOLE SODIUM 40 MG PO TBEC
40.0000 mg | DELAYED_RELEASE_TABLET | Freq: Every day | ORAL | Status: DC
  Administered 2015-10-08 – 2015-10-10 (×3): 40 mg via ORAL
  Filled 2015-10-08 (×3): qty 1

## 2015-10-08 MED ORDER — SACUBITRIL-VALSARTAN 97-103 MG PO TABS
1.0000 | ORAL_TABLET | Freq: Two times a day (BID) | ORAL | Status: DC
  Administered 2015-10-08: 1 via ORAL
  Filled 2015-10-08 (×4): qty 1

## 2015-10-08 MED ORDER — MORPHINE SULFATE (PF) 2 MG/ML IV SOLN
1.0000 mg | Freq: Four times a day (QID) | INTRAVENOUS | Status: DC | PRN
  Administered 2015-10-08 (×2): 1 mg via INTRAVENOUS
  Filled 2015-10-08 (×2): qty 1

## 2015-10-08 MED ORDER — LEVOTHYROXINE SODIUM 50 MCG PO TABS
50.0000 ug | ORAL_TABLET | Freq: Every day | ORAL | Status: DC
  Administered 2015-10-08 – 2015-10-10 (×3): 50 ug via ORAL
  Filled 2015-10-08 (×3): qty 1

## 2015-10-08 NOTE — H&P (Signed)
Hector [name] is an 50 y.o. male.   Chief Complaint: Bloody stools HPI: The patient with past medical history of GI bleed as well as atrial fibrillation presents to the emergency department complaining of 5 episodes of bright red bleeding per rectum. The patient states that the bowel movements are painless but that he has some abdominal discomfort. Some of these episodes of bleeding have been accompanied by nausea but he denies vomiting, dizziness, chest pain or shortness of breath. Emergency department he demonstrated grossly bloody stool. He was also found to have mild acute on chronic kidney disease which prompted the emergency department staff to call for admission.  Past Medical History:  Diagnosis Date  . Alcohol abuse   . 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 No inducible ischemia, fixed inferoapical defect = Infarct vs.artifact (not commensurate with extent of reduced EF); b) Echo 8/'12: Mild LV dilation, Mod-Severe global hypokinesis - EF 25-35%. Mild-moderate RV dilation no significant bowel lesion mild-moderate MR; c) Echo Jan 2016: EF 20-25%, high LAP, mild-moderate LV dilation, severe L  . Atrial fibrillation ([place])    a. new onset s/p unsuccessful TEE/DCCV on 08/16/2014; b. on apixaban   . Atrial flutter ([place])    a. new onset 07/2014; b. s/p unsuccessful TEE/DCCV 08/16/2014; c. on apixaban   . CHF (congestive heart failure) ([place])   . Chronic combined systolic and diastolic heart failure, NYHA class 2 ([place])    a. see above  . CKD (chronic kidney disease), stage III   . Elevated troponin    Chronically elevated. - level was 0.17-0.10 during recent admission  . Essential hypertension   . GI bleed 11/2013  . Hyperlipidemia   . Myocardial infarct ([place]) 2004  . Pancreatitis   . Sleep-disordered breathing    Has yet to have a sleep study    Past Surgical History:  Procedure Laterality Date  . CARDIAC CATHETERIZATION  N/A 01/09/2015   Procedure: Left Heart Cath and Coronary Angiography;  Surgeon: [name], MD;  Location: [place] [facility];  Service: Cardiovascular;  Laterality: N/A;  . ELECTROPHYSIOLOGIC STUDY N/A 08/16/2014   Procedure: CARDIOVERSION;  Surgeon: [name], MD;  Location: [facility];  Service: Cardiovascular;  Laterality: N/A;  . KNEE SURGERY Right   . NM MYOVIEW LTD  November 2011   No ischemia or infarction. EF 50-55% ( no improvement from 2009 Myoview EF of 28%  . TEE WITHOUT CARDIOVERSION N/A 08/16/2014   Procedure: TRANSESOPHAGEAL ECHOCARDIOGRAM (TEE);  Surgeon: [name], MD;  Location: [facility];  Service: Cardiovascular;  Laterality: N/A;    Family History  Problem Relation Age of Onset  . Hypertension Mother   . Hyperlipidemia Mother   . Diabetes Mother    Social History:  reports that he quit smoking about 17 years ago. He has a 12.00 pack-year smoking history. He has never used smokeless tobacco. He reports that he does not drink alcohol or use drugs.  Allergies:  Allergies  Allergen Reactions  . Eggs Or Egg-Derived Products Rash    Prior to Admission medications   Medication Sig Start Date End Date Taking? Authorizing Provider  amiodarone (PACERONE) 200 MG tablet Take 1 tablet (200 mg total) by mouth daily. 05/22/15  Yes [name], MD  apixaban (ELIQUIS) 5 MG TABS tablet Take 1 tablet (5 mg total) by mouth 2 (two) times daily. 09/12/14  Yes Alisa Graff, FNP  atorvastatin (LIPITOR) 80 MG  tablet Take 1 tablet (80 mg total) by mouth daily. 09/12/14  Yes Alisa Graff, FNP  carvedilol (COREG) 12.5 MG tablet Take 12.5 mg by mouth 2 (two) times daily with a meal. Currently taking 1/2 tablet in AM & 1 tablet in PM   Yes Historical Provider, MD  furosemide (LASIX) 40 MG tablet Take 1 tablet (40 mg total) by mouth daily. 05/22/15  Yes [name], MD  levothyroxine (SYNTHROID) 50 MCG tablet Take 1 tablet (50 mcg total) by mouth daily before breakfast.  06/18/15  Yes [name], MD  omeprazole (PRILOSEC) 20 MG capsule Take 1 capsule (20 mg total) by mouth daily. 05/22/15  Yes [name], MD  sacubitril-valsartan (ENTRESTO) 97-103 MG Take 1 tablet by mouth 2 (two) times daily. 07/01/15  Yes Alisa Graff, FNP     Results for orders placed or performed during the hospital encounter of 10/08/15 (from the past 48 hour(s))  Comprehensive metabolic panel     Status: Abnormal   Collection Time: 10/08/15  4:22 AM  Result Value Ref Range   Sodium 140 135 - 145 mmol/L   Potassium 4.1 3.5 - 5.1 mmol/L   Chloride 109 101 - 111 mmol/L   CO2 24 22 - 32 mmol/L   Glucose, Bld 143 (H) 65 - 99 mg/dL   BUN 24 (H) 6 - 20 mg/dL   Creatinine, Ser 2.61 (H) 0.61 - 1.24 mg/dL   Calcium 8.4 (L) 8.9 - 10.3 mg/dL   Total Protein 6.9 6.5 - 8.1 g/dL   Albumin 3.6 3.5 - 5.0 g/dL   AST 49 (H) 15 - 41 U/L   ALT 24 17 - 63 U/L   Alkaline Phosphatase 48 38 - 126 U/L   Total Bilirubin 1.0 0.3 - 1.2 mg/dL   GFR calc non Af Amer 27 (L) >60 mL/min   GFR calc Af Amer 31 (L) >60 mL/min    Comment: (NOTE) The eGFR has been calculated using the CKD EPI equation. This calculation has not been validated in all clinical situations. eGFR's persistently <60 mL/min signify possible Chronic Kidney Disease.    Anion gap 7 5 - 15  CBC     Status: Abnormal   Collection Time: 10/08/15  4:22 AM  Result Value Ref Range   WBC 9.0 3.8 - 10.6 K/uL   RBC 5.00 4.40 - 5.90 MIL/uL   Hemoglobin 15.1 13.0 - 18.0 g/dL   HCT 44.5 40.0 - 52.0 %   MCV 89.0 80.0 - 100.0 fL   MCH 30.2 26.0 - 34.0 pg   MCHC 33.9 32.0 - 36.0 g/dL   RDW 14.8 (H) 11.5 - 14.5 %   Platelets 208 150 - 440 K/uL  Type and screen [facility]     Status: None   Collection Time: 10/08/15  4:22 AM  Result Value Ref Range   ABO/RH(D) O POS    Antibody Screen NEG    Sample Expiration 10/11/2015   Protime-INR - (order if Patient is taking Coumadin / Warfarin)     Status: None   Collection  Time: 10/08/15  4:22 AM  Result Value Ref Range   Prothrombin Time 13.2 11.4 - 15.2 seconds   INR 1.00    No results found.  Review of Systems  Constitutional: Negative for chills and fever.  HENT: Negative for sore throat and tinnitus.   Eyes: Negative for blurred vision and redness.  Respiratory: Negative for cough and shortness of breath.  Cardiovascular: Negative for chest pain, palpitations, orthopnea and PND.  Gastrointestinal: Positive for abdominal pain, blood in stool and nausea. Negative for diarrhea and vomiting.  Genitourinary: Negative for dysuria, frequency and urgency.  Musculoskeletal: Negative for joint pain and myalgias.  Skin: Negative for rash.       No lesions  Neurological: Negative for speech change, focal weakness and weakness.  Endo/Heme/Allergies: Does not bruise/bleed easily.       No temperature intolerance  Psychiatric/Behavioral: Negative for depression and suicidal ideas.    Blood pressure (!) 165/110, pulse 67, temperature 98.1 F (36.7 C), temperature source Oral, resp. rate (!) 24, height '5\' 11"'  (1.803 m), weight 134.3 kg (296 lb), SpO2 93 %. Physical Exam  Vitals reviewed. Constitutional: He is oriented to person, place, and time. He appears well-developed and well-nourished. No distress.  HENT:  Head: Normocephalic and atraumatic.  Mouth/Throat: Oropharynx is clear and moist.  Eyes: Conjunctivae and EOM are normal. Pupils are equal, round, and reactive to light. No scleral icterus.  Neck: Normal range of motion. Neck supple. No JVD present. No tracheal deviation present. No thyromegaly present.  Cardiovascular: Normal rate, regular rhythm and normal heart sounds.  Exam reveals no gallop and no friction rub.   No murmur heard. Respiratory: Effort normal and breath sounds normal. No respiratory distress.  GI: Soft. Bowel sounds are normal. He exhibits distension. He exhibits no mass. There is tenderness. There is no rebound and no guarding.   Genitourinary:  Genitourinary Comments: Deferred  Musculoskeletal: Normal range of motion. He exhibits no edema.  Lymphadenopathy:    He has no cervical adenopathy.  Neurological: He is alert and oriented to person, place, and time. No cranial nerve deficit.  Skin: Skin is warm and dry. No rash noted. No erythema.  Psychiatric: He has a normal mood and affect. His behavior is normal. Judgment and thought content normal.     Assessment/Plan This is a 50 year old male admitted for GI bleed. 1. GI bleed: Bright red bleeding per rectum. Patient is hemodynamically stable. He is on Eliquis. We will hold anticoagulation for now. He is nothing by mouth. Gastroenterology to see the patient in the morning. 2. Essential hypertension: Uncontrolled. The patient's systolic blood pressure was greater than 200 and triaged. In the emergency department was consistently above 170. I placed Nitropaste on his chest and given him 1 dose of labetalol 5 mg IV. Likely contributing to GI bleeding. Continue home medications and increase dosing as necessary. 3. Atrial fibrillation: rate controlled; monitor telemetry. Hold anticoagulation. 4. Acute on CKD: secondary to bleeding. Hydrate with IV fluid front 5. History of all abuse: The patient has been sober for 5 years. 6. GI prophylaxis: Pantoprazole IV signed 7. DVT prophylaxis: SCDs The patient is a full code. Time spent on admission orders and patient care approximately 45 minutes  Harrie Foreman, MD 10/08/2015, 6:55 AM

## 2015-10-08 NOTE — ED Notes (Signed)
Floor unable to accept pt. At this time due to diastolic BP. RN accepting pt. Stated the pressure needs to be less than 170-low 90s. Will continue to monitor pt. BP in ED and transport to floor when criteria met.

## 2015-10-08 NOTE — Progress Notes (Signed)
Assessment & Plan  50 year old male with past medical history of alcohol abuse, alcoholic cardiomyopathy, history of chronic atrial fibrillation, previous history of GI bleed, chronic kidney disease stage III, history of combined systolic diastolic CHF, obstructive sleep apnea who presented to the hospital due to bright red blood per rectum.  1. GI bleed-this is a lower GI bleed given the patient's clinical symptoms. -Hemoglobin currently stable, and monitor. Transfuse if needed. -Place on chronic with diet, await gastroenterology input. Hold Apixiban.   2. History of chronic atrial fibrillation-currently rate controlled. Continue amiodarone, carvedilol. -Hold Apixiban.   3. History of combined diastolic/systolic CHF-clinically patient is not in congestive heart failure. -Continue Lasix, carvedilol, Entresto.   4. Hypothyroidism - cont. Synthroid.   5. Hx of CKD Stage III - Cr. Close to baseline and will monitor.   6. Hyperlipidemia - cont. Atorvastatin.

## 2015-10-08 NOTE — ED Triage Notes (Signed)
Per [facility]: Pt. Called for ambulance for Large amount of bright red blood present in stool 3x tonight. Pt. Reports this has happened before but nothing was found when scoped. Pt. Taking anticoagulant at home for irregular HR. Pt. Blood pressure 190/90 en route.

## 2015-10-08 NOTE — ED Notes (Signed)
EDP at bedside  

## 2015-10-08 NOTE — ED Provider Notes (Signed)
[facility] Emergency Department Provider Note    ____________________________________________   I have reviewed the triage vital signs and the nursing notes.   HISTORY  Chief Complaint Blood In Stools and Abdominal Pain   History limited by: Not Limited   HPI Hector [name] is a 50 y.o. male with history of afib on eliquis who presents to the emergency department tonight because of concern for GI bleeding. Patient first noticed it earlier this morning. Has now had multiple grossly bloody bowel movements. He does have some associated abdominal discomfort with this. The patient states that he felt fine when he went to bed last night. Had a similar episode one year ago and underwent colonoscopy at that time without any concerning findings.   Past Medical History:  Diagnosis Date  . Alcohol abuse   . 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 No inducible ischemia, fixed inferoapical defect = Infarct vs.artifact (not commensurate with extent of reduced EF); b) Echo 8/'12: Mild LV dilation, Mod-Severe global hypokinesis - EF 25-35%. Mild-moderate RV dilation no significant bowel lesion mild-moderate MR; c) Echo Jan 2016: EF 20-25%, high LAP, mild-moderate LV dilation, severe L  . Atrial fibrillation ([place])    a. new onset s/p unsuccessful TEE/DCCV on 08/16/2014; b. on apixaban   . Atrial flutter ([place])    a. new onset 07/2014; b. s/p unsuccessful TEE/DCCV 08/16/2014; c. on apixaban   . CHF (congestive heart failure) ([place])   . Chronic combined systolic and diastolic heart failure, NYHA class 2 ([place])    a. see above  . CKD (chronic kidney disease), stage III   . Elevated troponin    Chronically elevated. - level was 0.17-0.10 during recent admission  . Essential hypertension   . GI bleed 11/2013  . Hyperlipidemia   . Myocardial infarct ([place]) 2004  . Pancreatitis   . Sleep-disordered breathing    Has  yet to have a sleep study    Patient Active Problem List   Diagnosis Date Noted  . Nonischemic cardiomyopathy ([place]) 05/26/2015  . Chronic diastolic heart failure ([place]) 04/01/2015  . Essential hypertension 04/01/2015  . Sinus bradycardia 01/29/2015  . Paroxysmal atrial fibrillation ([place]) 08/16/2014  . Paroxysmal atrial flutter ([place]) 08/14/2014    Class: Acute  . Chronic kidney disease (CKD), stage III (moderate) 07/02/2014  . Hypertensive heart disease   . Hyperlipidemia     Past Surgical History:  Procedure Laterality Date  . CARDIAC CATHETERIZATION N/A 01/09/2015   Procedure: Left Heart Cath and Coronary Angiography;  Surgeon: [name], MD;  Location: [place] [facility];  Service: Cardiovascular;  Laterality: N/A;  . ELECTROPHYSIOLOGIC STUDY N/A 08/16/2014   Procedure: CARDIOVERSION;  Surgeon: [name], MD;  Location: [facility] OR[facility];  Service: Cardiovascular;  Laterality: N/A;  . KNEE SURGERY Right   . NM MYOVIEW LTD  November 2011   No ischemia or infarction. EF 50-55% ( no improvement from 2009 Myoview EF of 28%  . TEE WITHOUT CARDIOVERSION N/A 08/16/2014   Procedure: TRANSESOPHAGEAL ECHOCARDIOGRAM (TEE);  Surgeon: [name], MD;  Location: [facility] ORS;  Service: Cardiovascular;  Laterality: N/A;    Prior to Admission medications   Medication Sig Start Date End Date Taking? Authorizing Provider  amiodarone (PACERONE) 200 MG tablet Take 1 tablet (200 mg total) by mouth daily. 05/22/15  Yes [name], MD  apixaban (ELIQUIS) 5 MG TABS tablet Take 1 tablet (5 mg total) by mouth  2 (two) times daily. 09/12/14  Yes Alisa Graff, FNP  atorvastatin (LIPITOR) 80 MG tablet Take 1 tablet (80 mg total) by mouth daily. 09/12/14  Yes Alisa Graff, FNP  carvedilol (COREG) 12.5 MG tablet Take 12.5 mg by mouth 2 (two) times daily with a meal. Currently taking 1/2 tablet in AM & 1 tablet in PM   Yes Historical Provider, MD  furosemide (LASIX) 40 MG tablet Take 1 tablet (40 mg  total) by mouth daily. 05/22/15  Yes [name], MD  levothyroxine (SYNTHROID) 50 MCG tablet Take 1 tablet (50 mcg total) by mouth daily before breakfast. 06/18/15  Yes [name], MD  omeprazole (PRILOSEC) 20 MG capsule Take 1 capsule (20 mg total) by mouth daily. 05/22/15  Yes [name], MD  sacubitril-valsartan (ENTRESTO) 97-103 MG Take 1 tablet by mouth 2 (two) times daily. 07/01/15  Yes Alisa Graff, FNP    Allergies Eggs or egg-derived products  Family History  Problem Relation Age of Onset  . Hypertension Mother   . Hyperlipidemia Mother   . Diabetes Mother     Social History Social History  Substance Use Topics  . Smoking status: Former Smoker    Packs/day: 1.00    Years: 12.00    Quit date: 02/23/1998  . Smokeless tobacco: Never Used  . Alcohol use No     Comment: 4 heavy alcohol drinker. Quit 2 years ago.    Review of Systems  Constitutional: Negative for fever. Cardiovascular: Negative for chest pain. Respiratory: Negative for shortness of breath. Gastrointestinal: Positive for abdominal discomfort and GI bleed.  Skin: Negative for rash. Neurological: Negative for headaches, focal weakness or numbness. 10-point ROS otherwise negative.  ____________________________________________   PHYSICAL EXAM:  VITAL SIGNS: ED Triage Vitals  Enc Vitals Group     BP 10/08/15 0413 (!) 203/151     Pulse Rate 10/08/15 0418 75     Resp 10/08/15 0418 15     Temp 10/08/15 0418 98.1 F (36.7 C)     Temp Source 10/08/15 0418 Oral     SpO2 10/08/15 0418 93 %     Weight 10/08/15 0419 296 lb (134.3 kg)     Height 10/08/15 0419 5\' 11"  (1.803 m)     Head Circumference --      Peak Flow --      Pain Score 10/08/15 0419 3   Constitutional: Alert and oriented. Well appearing and in no distress. Eyes: Conjunctivae are normal. PERRL. Normal extraocular movements. ENT   Head: Normocephalic and atraumatic.   Nose: No congestion/rhinnorhea.   Mouth/Throat:  Mucous membranes are moist.   Neck: No stridor. Hematological/Lymphatic/Immunilogical: No cervical lymphadenopathy. Cardiovascular: Normal rate, regular rhythm.  No murmurs, rubs, or gallops. Respiratory: Normal respiratory effort without tachypnea nor retractions. Breath sounds are clear and equal bilaterally. No wheezes/rales/rhonchi. Gastrointestinal: Soft and nontender. No distention.  Genitourinary: Deferred Musculoskeletal: Normal range of motion in all extremities. No joint effusions.  No lower extremity tenderness nor edema. Neurologic:  Normal speech and language. No gross focal neurologic deficits are appreciated.  Skin:  Skin is warm, dry and intact. No rash noted. Psychiatric: Mood and affect are normal. Speech and behavior are normal. Patient exhibits appropriate insight and judgment.  ____________________________________________    LABS (pertinent positives/negatives)  Labs Reviewed  COMPREHENSIVE METABOLIC PANEL - Abnormal; Notable for the following:       Result Value   Glucose, Bld 143 (*)    BUN 24 (*)  Creatinine, Ser 2.61 (*)    Calcium 8.4 (*)    AST 49 (*)    GFR calc non Af Amer 27 (*)    GFR calc Af Amer 31 (*)    All other components within normal limits  CBC - Abnormal; Notable for the following:    RDW 14.8 (*)    All other components within normal limits  PROTIME-INR  POC OCCULT BLOOD, ED  TYPE AND SCREEN     ____________________________________________   EKG  I, Nance Pear, attending physician, personally viewed and interpreted this EKG  EKG Time: 0416 Rate: 70 Rhythm: normal sinus rhythm Axis: left axis deviation Intervals: qtc 491 QRS: IVCD ST changes: no st elevation Impression: abnormal ekg   ____________________________________________    RADIOLOGY  None  ____________________________________________   PROCEDURES  Procedures  ____________________________________________   INITIAL IMPRESSION / ASSESSMENT  AND PLAN / ED COURSE  Pertinent labs & imaging results that were available during my care of the patient were reviewed by me and considered in my medical decision making (see chart for details).  Patient presented to the emergency department today because of concerns for GI bleeding. Patient is on L course and had a large bloody bowel movement here. Currently not tachycardic or hypotensive. Hemoglobin was within normal limits. However given the continued GI bleeds and on eliquis will admit to hospitalist service. ______________________________________   FINAL CLINICAL IMPRESSION(S) / ED DIAGNOSES  Final diagnoses:  Gastrointestinal hemorrhage, unspecified gastritis, unspecified gastrointestinal hemorrhage type     Note: This dictation was prepared with Dragon dictation. Any transcriptional errors that result from this process are unintentional    Nance Pear, MD 10/08/15 [phone]

## 2015-10-09 LAB — HEMOGLOBIN: HEMOGLOBIN: 14.6 g/dL (ref 13.0–18.0)

## 2015-10-09 MED ORDER — CARVEDILOL 6.25 MG PO TABS
3.1250 mg | ORAL_TABLET | Freq: Two times a day (BID) | ORAL | Status: DC
  Administered 2015-10-10: 09:00:00 3.125 mg via ORAL
  Filled 2015-10-09 (×2): qty 1

## 2015-10-09 MED ORDER — SACUBITRIL-VALSARTAN 24-26 MG PO TABS
1.0000 | ORAL_TABLET | Freq: Two times a day (BID) | ORAL | Status: DC
  Administered 2015-10-09 – 2015-10-10 (×2): 1 via ORAL
  Filled 2015-10-09 (×3): qty 1

## 2015-10-09 MED ORDER — AMMONIUM LACTATE 12 % EX LOTN
TOPICAL_LOTION | Freq: Every day | CUTANEOUS | Status: DC
  Administered 2015-10-09: 13:00:00 via TOPICAL
  Filled 2015-10-09: qty 225

## 2015-10-09 NOTE — Consult Note (Signed)
Consultation  Referring Provider:     Dr Marcille Blanco Admit date 10/08/15 Consult date   10/09/15      Reason for Consultation:   GIB           HPI:   Hector [name] is a 50 y.o. male with history of alcoholic cardiomyopathy ([id number] EF 50-55%), atrial fibrillation on Eliquis, MI, HTN, pancreatitis, and CKD, who developed bright red bloody bowel movements yesterday morning with some abdominal discomfort. His hgb has been normal since yesterday upon 4 checks, ranging 15.1-13.9.  BUN and creatinine ordered and elevated but appear consistent with his readings through 2015.  Patient reports onset of llq cramping/discomfort night before last at 0245. Felt the urge to defecate, passed some red blood with stool, then passed a few more progressively bloody stools at home- came to ED, had 2 more, then one more bloody stool upon arrival to floor yesterday. States this resolved on its own and he has had no further bowel movements since then.  Last dose of Eliquis was Monday. States he was in his USOH prior to symptoms, worked the evening prior to this episode, and is unaware of any kind of triggering event. States he had a similar episode to this about a year ago- although I don't have record of this. He was hospitalized for hematemeis 2015, thought to be related to a small mallory weiss tear and treated conservatively  PREVIOUS ENDOSCOPIES:            Colonoscopy and EGD in 08/2013 by Dr. Vira Agar, which revealed a hiatal hernia, gastritis, as well as erythematous duodenopathy, as well as diverticulosis, and internal and external hemorrhoids . Sober x 39yr CT 2015 without contrast at [facility] with possible mild ascites but no definite liver cirrhosis, there were no bowel or urinary tract abnormalities.  Past Medical History:  Diagnosis Date  . Alcohol abuse   . Alcoholic cardiomyopathy ([place]) 2009   a) Myoview: 11/'09: EF 28% with globally decreased function. No inducible ischemia, fixed inferoapical defect =  Infarct vs.artifact (not commensurate with extent of reduced EF); b) Echo 8/'12: Mild LV dilation, Mod-Severe global hypokinesis - EF 25-35%. Mild-moderate RV dilation no significant bowel lesion mild-moderate MR; c) Echo Jan 2016: EF 20-25%, high LAP, mild-moderate LV dilation, severe L  . Atrial fibrillation ([place])    a. new onset s/p unsuccessful TEE/DCCV on 08/16/2014; b. on apixaban   . Atrial flutter ([place])    a. new onset 07/2014; b. s/p unsuccessful TEE/DCCV 08/16/2014; c. on apixaban   . CHF (congestive heart failure) ([place])   . Chronic combined systolic and diastolic heart failure, NYHA class 2 ([place])    a. see above  . CKD (chronic kidney disease), stage III   . Elevated troponin    Chronically elevated. - level was 0.17-0.10 during recent admission  . Essential hypertension   . GI bleed 11/2013  . Hyperlipidemia   . Myocardial infarct ([place]) 2004  . Pancreatitis   . Sleep-disordered breathing    Has yet to have a sleep study    Past Surgical History:  Procedure Laterality Date  . CARDIAC CATHETERIZATION N/A 01/09/2015   Procedure: Left Heart Cath and Coronary Angiography;  Surgeon: [name], MD;  Location: [facility];  Service: Cardiovascular;  Laterality: N/A;  . ELECTROPHYSIOLOGIC STUDY N/A 08/16/2014   Procedure: CARDIOVERSION;  Surgeon: [name], MD;  Location: [facility] ORS;  Service: Cardiovascular;  Laterality: N/A;  . KNEE SURGERY Right   . NM  MYOVIEW LTD  November 2011   No ischemia or infarction. EF 50-55% ( no improvement from 2009 Myoview EF of 28%  . TEE WITHOUT CARDIOVERSION N/A 08/16/2014   Procedure: TRANSESOPHAGEAL ECHOCARDIOGRAM (TEE);  Surgeon: [name], MD;  Location: [facility] ORS;  Service: Cardiovascular;  Laterality: N/A;    Family History  Problem Relation Age of Onset  . Hypertension Mother   . Hyperlipidemia Mother   . Diabetes Mother     Social History  Substance Use Topics  . Smoking status: Former Smoker    Packs/day:  1.00    Years: 12.00    Quit date: 02/23/1998  . Smokeless tobacco: Never Used  . Alcohol use No     Comment: 4 heavy alcohol drinker. Quit 2 years ago.    Prior to Admission medications   Medication Sig Start Date End Date Taking? Authorizing Provider  amiodarone (PACERONE) 200 MG tablet Take 1 tablet (200 mg total) by mouth daily. 05/22/15  Yes [name], MD  apixaban (ELIQUIS) 5 MG TABS tablet Take 1 tablet (5 mg total) by mouth 2 (two) times daily. 09/12/14  Yes Alisa Graff, FNP  atorvastatin (LIPITOR) 80 MG tablet Take 1 tablet (80 mg total) by mouth daily. 09/12/14  Yes Alisa Graff, FNP  carvedilol (COREG) 12.5 MG tablet Take 12.5 mg by mouth 2 (two) times daily with a meal. Currently taking 1/2 tablet in AM & 1 tablet in PM   Yes Historical Provider, MD  furosemide (LASIX) 40 MG tablet Take 1 tablet (40 mg total) by mouth daily. 05/22/15  Yes [name], MD  levothyroxine (SYNTHROID) 50 MCG tablet Take 1 tablet (50 mcg total) by mouth daily before breakfast. 06/18/15  Yes [name], MD  omeprazole (PRILOSEC) 20 MG capsule Take 1 capsule (20 mg total) by mouth daily. 05/22/15  Yes [name], MD  sacubitril-valsartan (ENTRESTO) 97-103 MG Take 1 tablet by mouth 2 (two) times daily. 07/01/15  Yes Alisa Graff, FNP    Current Facility-Administered Medications  Medication Dose Route Frequency Provider Last Rate Last Dose  . acetaminophen (TYLENOL) tablet 650 mg  650 mg Oral Q6H PRN Harrie Foreman, MD       Or  . acetaminophen (TYLENOL) suppository 650 mg  650 mg Rectal Q6H PRN Harrie Foreman, MD      . amiodarone (PACERONE) tablet 200 mg  200 mg Oral Daily Harrie Foreman, MD   200 mg at 10/08/15 0819  . ammonium lactate (LAC-HYDRIN) 12 % lotion   Topical Daily Henreitta Leber, MD      . atorvastatin (LIPITOR) tablet 80 mg  80 mg Oral Daily Harrie Foreman, MD   80 mg at 10/08/15 0820  . carvedilol (COREG) tablet 12.5 mg  12.5 mg Oral BID WC Harrie Foreman, MD   12.5 mg at 10/09/15 [id number]  . famotidine (PEPCID) IVPB 20 mg premix  20 mg Intravenous Q12H Harrie Foreman, MD   20 mg at 10/09/15 0953  . furosemide (LASIX) tablet 40 mg  40 mg Oral Daily Harrie Foreman, MD   40 mg at 10/08/15 0819  . HYDROcodone-acetaminophen (NORCO/VICODIN) 5-325 MG per tablet 1-2 tablet  1-2 tablet Oral Q4H PRN Harrie Foreman, MD      . levothyroxine (SYNTHROID, LEVOTHROID) tablet 50 mcg  50 mcg Oral QAC breakfast Harrie Foreman, MD   50 mcg at 10/09/15 [phone]  . morphine 2 MG/ML injection 1  mg  1 mg Intravenous Q6H PRN Henreitta Leber, MD   1 mg at 10/08/15 1637  . ondansetron (ZOFRAN) tablet 4 mg  4 mg Oral Q6H PRN Harrie Foreman, MD       Or  . ondansetron [facility]) injection 4 mg  4 mg Intravenous Q6H PRN Harrie Foreman, MD   4 mg at 10/08/15 1032  . pantoprazole (PROTONIX) EC tablet 40 mg  40 mg Oral Daily Harrie Foreman, MD   40 mg at 10/09/15 0954  . pneumococcal 23 valent vaccine (PNU-IMMUNE) injection 0.5 mL  0.5 mL Intramuscular Tomorrow-1000 Henreitta Leber, MD      . sacubitril-valsartan (ENTRESTO) 97-103 mg per tablet  1 tablet Oral BID Harrie Foreman, MD   1 tablet at 10/08/15 [phone]  . sodium chloride flush (NS) 0.9 % injection 3 mL  3 mL Intravenous Q12H Harrie Foreman, MD   3 mL at 10/09/15 1000    Allergies as of 10/08/2015 - Review Complete 10/08/2015  Allergen Reaction Noted  . Eggs or egg-derived products Rash 03/28/2014     Review of Systems:    All systems reviewed and negative except where noted in HPI, with the exception of uncontrolled htn on admission treated with beta blockers. Additionally his TSH is quite high at 47.715   Physical Exam:  Vital signs in last 24 hours: Temp:  [97.7 F (36.5 C)-98.4 F (36.9 C)] 98.4 F (36.9 C) (08/16 0800) Pulse Rate:  [61-70] 61 (08/16 1102) Resp:  [16-18] 18 (08/15 2052) BP: (82-128)/(56-84) 102/76 (08/16 1102) SpO2:  [91 %-95 %] 93 % (08/16 0800) Weight:  [132.9  kg (293 lb)] 132.9 kg (293 lb) (08/16 0500) Last BM Date: 10/08/15 General:   Pleasant man in NAD Head:  Normocephalic and atraumatic. Eyes:   No icterus.   Conjunctiva pink. Ears:  Normal auditory acuity. Mouth: Mucosa pink moist, no lesions. Neck:  Supple; no masses felt Lungs:  Respirations even and unlabored. Lungs clear to auscultation bilaterally.   No wheezes, crackles, or rhonchi.  Heart:  S1S2, RRR, no MRG. No edema. Abdomen:   Flat, soft, nondistended, nontender. Normal bowel sounds. No appreciable masses or hepatomegaly. No rebound signs or other peritoneal signs. Rectal:  No masses. Nontender. Scant amount of marronish effluent. Msk:  MAEW x4, No clubbing or cyanosis. Strength 5/5. Symmetrical without gross deformities. Neurologic:  Alert and  oriented x4;  Cranial nerves II-XII intact.  Skin:  Warm, dry, pink without significant lesions or rashes. Psych:  Alert and cooperative. Normal affect.  LAB RESULTS:  Recent Labs  10/08/15 0422 10/08/15 1251 10/08/15 1831 10/09/15 0035  WBC 9.0  --   --   --   HGB 15.1 13.9 13.9 14.6  HCT 44.5  --   --   --   PLT 208  --   --   --    BMET  Recent Labs  10/08/15 0422  NA 140  K 4.1  CL 109  CO2 24  GLUCOSE 143*  BUN 24*  CREATININE 2.61*  CALCIUM 8.4*   LFT  Recent Labs  10/08/15 0422  PROT 6.9  ALBUMIN 3.6  AST 49*  ALT 24  ALKPHOS 48  BILITOT 1.0   PT/INR  Recent Labs  10/08/15 0422  LABPROT 13.2  INR 1.00    STUDIES: No results found.     Impression / Plan:   1. Rectal bleeding/LLQ pain. No further pain, no bloody bowel movements since yesterday. Ddx  included ischemic colitis. Will discuss flexible sigmoidoscopy with Dr Gustavo Lah, however patient will need to be off the Eliquis for the appropriate time prior to invasive procedures. If, however, he has any significant hematochezia, would consider stat GIb scan. He is currently hemodynamically stable and has a normal hgb. 2. Elevated TSH: has  been taking his PPI with his levothyroxine- advised to take the thyroid med on its own.  Thank you very much for this consult. These services were provided by Stephens November, NP-C, in collaboration with [name], MD, with whom I have discussed this patient in full.   Stephens November, NP-C Did discuss with Dr Gustavo Lah- will try to plan for flexible sigmoidoscopy tomorrow pm

## 2015-10-09 NOTE — Consult Note (Signed)
[facility] Nurse wound consult note Reason for Consult:Bilateral heels are dry and cracked.  Will add moisturizing lotion.  Wound type:Chronic skin changes.  Pressure Ulcer POA: N/a Measurement:Bilateral heels Wound bed:Dry cracked skin Drainage (amount, consistency, odor) none Periwound:Intact Dressing procedure/placement/frequency:CLeanse feet daily with soap and water.  Apply Amlactin daily.  Will not follow at this time.  Please re-consult if needed.  Domenic Moras RN BSN [place] Pager [phone]

## 2015-10-09 NOTE — Progress Notes (Signed)
[place] [facility] [place] NAME: Dewain Fogg    MR#:  [id number]  DATE OF BIRTH:  [birth year]  SUBJECTIVE:   Pt. Here due to Hematochezia and suspected GI bleed.  No further bleeding overnight.  Hg. Stable.    REVIEW OF SYSTEMS:    Review of Systems  Constitutional: Negative for chills and fever.  HENT: Negative for congestion and tinnitus.   Eyes: Negative for blurred vision and double vision.  Respiratory: Negative for cough, shortness of breath and wheezing.   Cardiovascular: Negative for chest pain, orthopnea and PND.  Gastrointestinal: Negative for abdominal pain, diarrhea, nausea and vomiting.  Genitourinary: Negative for dysuria and hematuria.  Neurological: Negative for dizziness, sensory change and focal weakness.  All other systems reviewed and are negative.   Nutrition: Clear liquid Tolerating Diet: Yes Tolerating PT: Ambulatory  DRUG ALLERGIES:   Allergies  Allergen Reactions  . Eggs Or Egg-Derived Products Rash    VITALS:  Blood pressure 107/77, pulse 61, temperature 97.8 F (36.6 C), temperature source Oral, resp. rate 18, height 5\' 11"  (1.803 m), weight 132.9 kg (293 lb), SpO2 94 %.  PHYSICAL EXAMINATION:   Physical Exam  GENERAL:  50 year old obese patient lying in bed in no acute distress.  EYES: Pupils equal, round, reactive to light and accommodation. No scleral icterus. Extraocular muscles intact.  HEENT: Head atraumatic, normocephalic. Oropharynx and nasopharynx clear.  NECK:  Supple, no jugular venous distention. No thyroid enlargement, no tenderness.  LUNGS: Normal breath sounds bilaterally, no wheezing, rales, rhonchi. No use of accessory muscles of respiration.  CARDIOVASCULAR: S1, S2 normal. No murmurs, rubs, or gallops.  ABDOMEN: Soft, nontender, nondistended. Bowel sounds present. No organomegaly or mass.  EXTREMITIES: No cyanosis, clubbing or edema b/l.    NEUROLOGIC: Cranial nerves II  through XII are intact. No focal Motor or sensory deficits b/l.   PSYCHIATRIC: The patient is alert and oriented x 3.  SKIN: No obvious rash, lesion, or ulcer.    LABORATORY PANEL:   CBC  Recent Labs Lab 10/08/15 0422  10/09/15 0035  WBC 9.0  --   --   HGB 15.1  < > 14.6  HCT 44.5  --   --   PLT 208  --   --   < > = values in this interval not displayed. ------------------------------------------------------------------------------------------------------------------  Chemistries   Recent Labs Lab 10/08/15 0422  NA 140  K 4.1  CL 109  CO2 24  GLUCOSE 143*  BUN 24*  CREATININE 2.61*  CALCIUM 8.4*  AST 49*  ALT 24  ALKPHOS 48  BILITOT 1.0   ------------------------------------------------------------------------------------------------------------------  Cardiac Enzymes No results for input(s): TROPONINI in the last 168 hours. ------------------------------------------------------------------------------------------------------------------  RADIOLOGY:  No results found.   ASSESSMENT AND PLAN:    50 year old male with past medical history of alcohol abuse, alcoholic cardiomyopathy, history of chronic atrial fibrillation, previous history of GI bleed, chronic kidney disease stage III, history of combined systolic diastolic CHF, obstructive sleep apnea who presented to the hospital due to bright red blood per rectum.  1. GI bleed-this is a lower GI bleed given the patient's clinical symptoms. -Hemoglobin currently stable, and monitor. Transfuse if Hg < 7.   -Continue clear liquid diet. Discussed with gastroenterology and plan for endoscopy tomorrow. - Hold Apixiban.   2. History of chronic atrial fibrillation-currently rate controlled. Continue amiodarone, carvedilol. -Hold Apixiban given GI bleed.   3. History of combined diastolic/systolic CHF-clinically patient is not in congestive  heart failure. - BP on the low side.  Will lower dose of Coreg, Entresto.   Cont. Lasix and follow.   4. Hypothyroidism - cont. Synthroid.   5. Hx of CKD Stage III - Cr. Close to baseline and will monitor.   6. Hyperlipidemia - cont. Atorvastatin.    All the records are reviewed and case discussed with Care Management/Social Worker. Management plans discussed with the patient, family and they are in agreement.  CODE STATUS: Full Code  DVT Prophylaxis: Ted's SCD's  TOTAL TIME TAKING CARE OF THIS PATIENT: 30 minutes.   POSSIBLE D/C IN 1-2 DAYS, DEPENDING ON CLINICAL CONDITION.   Henreitta Leber M.D on 10/09/2015 at 3:12 PM  Between 7am to 6pm - Pager - [phone]  After 6pm go to [email or web address] - Proofreader  [facility] [place] [facility]  Office  [phone]  CC: Primary care physician; [name], NP

## 2015-10-09 NOTE — Consult Note (Signed)
Please see full GI consult by Ms. London. Patient seen and examined, chart reviewed. Patient is a 50 year old male admitted with rectal bleeding. He has been on eliquis as an outpatient for atrial fibrillation for several months. He last saw rectal bleeding yesterday. He is been off of the anticoagulant medication currently for over 24 hours. Recommend flexible sigmoidoscopy. We will arrange this for tomorrow afternoon. I discussed the risks benefits and complications of the procedure to include not limited to bleeding infection perforation and the risk of sedation, and he wishes to proceed.

## 2015-10-10 ENCOUNTER — Encounter
Admission: EM | Disposition: A | Discharge status: Home / Self Care | Payer: Self-pay | Source: Home / Self Care | Attending: Specialist

## 2015-10-10 HISTORY — PX: FLEXIBLE SIGMOIDOSCOPY: SHX5431

## 2015-10-10 LAB — BASIC METABOLIC PANEL
Anion gap: 7 (ref 5–15)
BUN: 24 mg/dL — AB (ref 6–20)
CHLORIDE: 107 mmol/L (ref 101–111)
CO2: 26 mmol/L (ref 22–32)
CREATININE: 2.41 mg/dL — AB (ref 0.61–1.24)
Calcium: 7.6 mg/dL — ABNORMAL LOW (ref 8.9–10.3)
GFR calc Af Amer: 34 mL/min — ABNORMAL LOW (ref >60)
GFR calc non Af Amer: 29 mL/min — ABNORMAL LOW (ref >60)
GLUCOSE: 103 mg/dL — AB (ref 65–99)
POTASSIUM: 3.7 mmol/L (ref 3.5–5.1)
SODIUM: 140 mmol/L (ref 135–145)

## 2015-10-10 LAB — CBC
HCT: 40.5 % (ref 40.0–52.0)
HEMOGLOBIN: 13.4 g/dL (ref 13.0–18.0)
MCH: 29.9 pg (ref 26.0–34.0)
MCHC: 33.1 g/dL (ref 32.0–36.0)
MCV: 90.3 fL (ref 80.0–100.0)
Platelets: 199 10*3/uL (ref 150–440)
RBC: 4.49 MIL/uL (ref 4.40–5.90)
RDW: 14.9 % — ABNORMAL HIGH (ref 11.5–14.5)
WBC: 7.5 10*3/uL (ref 3.8–10.6)

## 2015-10-10 SURGERY — SIGMOIDOSCOPY, FLEXIBLE
Anesthesia: Moderate Sedation

## 2015-10-10 MED ORDER — FENTANYL CITRATE (PF) 100 MCG/2ML IJ SOLN
INTRAMUSCULAR | Status: AC
  Filled 2015-10-10: qty 2

## 2015-10-10 MED ORDER — MIDAZOLAM HCL 5 MG/5ML IJ SOLN
INTRAMUSCULAR | Status: DC | PRN
  Administered 2015-10-10 (×2): 1 mg via INTRAVENOUS

## 2015-10-10 MED ORDER — MIDAZOLAM HCL 5 MG/5ML IJ SOLN
INTRAMUSCULAR | Status: AC
  Filled 2015-10-10: qty 5

## 2015-10-10 MED ORDER — SODIUM CHLORIDE 0.9 % IV SOLN
INTRAVENOUS | Status: DC
  Administered 2015-10-10: 1000 mL via INTRAVENOUS

## 2015-10-10 MED ORDER — METHYLCELLULOSE (LAXATIVE) PO POWD: 1.0000 | Freq: Every day | ORAL | 0 refills | Status: DC | PRN

## 2015-10-10 MED ORDER — HYDROCORTISONE ACE-PRAMOXINE 2.5-1 % RE CREA: 1.0000 "application " | TOPICAL_CREAM | Freq: Three times a day (TID) | RECTAL | 0 refills | Status: DC

## 2015-10-10 MED ORDER — CARVEDILOL 3.125 MG PO TABS: 3.1250 mg | ORAL_TABLET | Freq: Two times a day (BID) | ORAL | 1 refills | Status: DC

## 2015-10-10 MED ORDER — SACUBITRIL-VALSARTAN 24-26 MG PO TABS: 1.0000 | ORAL_TABLET | Freq: Two times a day (BID) | ORAL | 1 refills | Status: DC

## 2015-10-10 NOTE — Progress Notes (Signed)
Pt discharged today, discharge instructions explained to patient, he verified understanding, 4 prescriptions given to patient. All belongings packed and given to patient. Home medications from pharmacy returned to patient. He was rolled out in a wheelchair by staff.

## 2015-10-10 NOTE — Progress Notes (Signed)
1st tap water enema infused at 1200. Pt tolerated well.

## 2015-10-10 NOTE — Consult Note (Signed)
Subjective: Patient seen for rectal bleeding. Is no nausea or abdominal pain. He  Patient saw a small amount of blood last night with a bowel movement he tolerated enemas well today for his procedure. No nausea or abdominal pain. Patient has been off his blood thinning medication, apixiban for over 48 hours.   Objective: Vital signs in last 24 hours: Temp:  [97.8 F (36.6 C)-101.9 F (38.8 C)] 97.9 F (36.6 C) (08/17 1339) Pulse Rate:  [52-64] 59 (08/17 1339) Resp:  [18-20] 20 (08/17 1339) BP: (107-134)/(77-99) 124/87 (08/17 1339) SpO2:  [93 %-97 %] 97 % (08/17 1317) Weight:  [134 kg (295 lb 8 oz)] 134 kg (295 lb 8 oz) (08/17 0500) Blood pressure 124/87, pulse (!) 59, temperature 97.9 F (36.6 C), temperature source Tympanic, resp. rate 20, height 5\' 11"  (1.803 m), weight 134 kg (295 lb 8 oz), SpO2 97 %.   Intake/Output from previous day: 08/16 0701 - 08/17 0700 In: 1850 [P.O.:1800; IV Piggyback:50] Out: 1200 [Urine:1200]  Intake/Output this shift: No intake/output data recorded.   General appearance: 50 year old male no distress Resp:  Bilaterally clear to auscultation Cardio:  Regular rate and rhythm GI:  Obese, nontender, nondistended bowel sounds are positive and normoactive Extremities:  No clubbing cyanosis or edema   Lab Results: Results for orders placed or performed during the hospital encounter of 10/08/15 (from the past 24 hour(s))  CBC     Status: Abnormal   Collection Time: 10/10/15  3:44 AM  Result Value Ref Range   WBC 7.5 3.8 - 10.6 K/uL   RBC 4.49 4.40 - 5.90 MIL/uL   Hemoglobin 13.4 13.0 - 18.0 g/dL   HCT 40.5 40.0 - 52.0 %   MCV 90.3 80.0 - 100.0 fL   MCH 29.9 26.0 - 34.0 pg   MCHC 33.1 32.0 - 36.0 g/dL   RDW 14.9 (H) 11.5 - 14.5 %   Platelets 199 150 - 440 K/uL  Basic metabolic panel     Status: Abnormal   Collection Time: 10/10/15  3:44 AM  Result Value Ref Range   Sodium 140 135 - 145 mmol/L   Potassium 3.7 3.5 - 5.1 mmol/L   Chloride 107 101  - 111 mmol/L   CO2 26 22 - 32 mmol/L   Glucose, Bld 103 (H) 65 - 99 mg/dL   BUN 24 (H) 6 - 20 mg/dL   Creatinine, Ser 2.41 (H) 0.61 - 1.24 mg/dL   Calcium 7.6 (L) 8.9 - 10.3 mg/dL   GFR calc non Af Amer 29 (L) >60 mL/min   GFR calc Af Amer 34 (L) >60 mL/min   Anion gap 7 5 - 15      Recent Labs  10/08/15 0422  10/08/15 1831 10/09/15 0035 10/10/15 0344  WBC 9.0  --   --   --  7.5  HGB 15.1  < > 13.9 14.6 13.4  HCT 44.5  --   --   --  40.5  PLT 208  --   --   --  199  < > = values in this interval not displayed. BMET  Recent Labs  10/08/15 0422 10/10/15 0344  NA 140 140  K 4.1 3.7  CL 109 107  CO2 24 26  GLUCOSE 143* 103*  BUN 24* 24*  CREATININE 2.61* 2.41*  CALCIUM 8.4* 7.6*   LFT  Recent Labs  10/08/15 0422  PROT 6.9  ALBUMIN 3.6  AST 49*  ALT 24  ALKPHOS 48  BILITOT 1.0  PT/INR  Recent Labs  10/08/15 0422  LABPROT 13.2  INR 1.00   Hepatitis Panel No results for input(s): HEPBSAG, HCVAB, HEPAIGM, HEPBIGM in the last 72 hours. C-Diff No results for input(s): CDIFFTOX in the last 72 hours. No results for input(s): CDIFFPCR in the last 72 hours.   Studies/Results: No results found.  Scheduled Inpatient Medications:   . fentaNYL      . midazolam      . [MAR Hold] amiodarone  200 mg Oral Daily  . [MAR Hold] ammonium lactate   Topical Daily  . [MAR Hold] atorvastatin  80 mg Oral Daily  . [MAR Hold] carvedilol  3.125 mg Oral BID WC  . [MAR Hold] famotidine (PEPCID) IV  20 mg Intravenous Q12H  . [MAR Hold] furosemide  40 mg Oral Daily  . [MAR Hold] levothyroxine  50 mcg Oral QAC breakfast  . [MAR Hold] pantoprazole  40 mg Oral Daily  . [MAR Hold] pneumococcal 23 valent vaccine  0.5 mL Intramuscular Tomorrow-1000  . [MAR Hold] sacubitril-valsartan  1 tablet Oral BID  . [MAR Hold] sodium chloride flush  3 mL Intravenous Q12H    Continuous Inpatient Infusions:   . sodium chloride 1,000 mL (10/10/15 1358)    PRN Inpatient Medications:   [MAR Hold] acetaminophen **OR** [MAR Hold] acetaminophen, [MAR Hold] HYDROcodone-acetaminophen, [MAR Hold]  morphine injection, [MAR Hold] ondansetron **OR** [MAR Hold] ondansetron (ZOFRAN) IV  Miscellaneous:   Assessment:  1. Rectal bleeding. Patient has been hemodynamically stable with stable hemoglobin. There's been no abdominal pain and no dyschezia.  Plan:  1. Flexible sigmoidoscopy today. I have discussed the risks benefits and complications of procedures to include not limited to bleeding, infection, perforation and the risk of sedation and the patient wishes to proceed. Further recommendations to follow  [name] MD 10/10/2015, 2:10 PM

## 2015-10-10 NOTE — Op Note (Signed)
[facility] Patient Name: Hector [name] Procedure Date: 10/10/2015 2:12 PM MRN: [id number] Account #: [id number] Date of Birth: 1965-11-18 Admit Type: Inpatient Age: 50 Room: [facility] ENDO ROOM 2 Gender: Male Note Status: Finalized Procedure:            Flexible Sigmoidoscopy Indications:          Anal hemorrhage Providers:            [name], MD Referring MD:         Belia Heman. Verdell Carmine, MD (Referring MD) Medicines:            Midazolam 2 mg IV Complications:        No immediate complications. Procedure:            Pre-Anesthesia Assessment:                       - ASA Grade Assessment: III - A patient with severe                        systemic disease.                       After obtaining informed consent, the scope was passed                        under direct vision. The Colonoscope was introduced                        through the anus and advanced to the 30 cm from the                        anal verge. The flexible sigmoidoscopy was accomplished                        without difficulty. The patient tolerated the procedure                        well. The quality of the bowel preparation was                        adequate, however did not go above 30 cm due to prep. Findings:      The perianal exam findings include non-thrombosed external hemorrhoids.       One of these showed a small amount of blood on rectal exam. No other       lesions were noted to 30 cm from the anal verge.      Non-bleeding internal hemorrhoids were found during retroflexion and       during anoscopy. The hemorrhoids were small and Grade I (internal       hemorrhoids that do not prolapse). Impression:           - Non-thrombosed external hemorrhoids found on perianal                        exam.                       - Non-bleeding internal hemorrhoids.                       - No specimens collected. Recommendation:       -  Return patient to hospital  ward for possible                        discharge same day.                       - Use Citrucel one tablespoon PO daily daily.                       - Use Analpram HC Cream 2.5%: Apply externally TID for                        10 days. Procedure Code(s):    --- Professional ---                       [phone], Sigmoidoscopy, flexible; diagnostic, including                        collection of specimen(s) by brushing or washing, when                        performed (separate procedure) Diagnosis Code(s):    --- Professional ---                       K64.0, First degree hemorrhoids                       K64.4, Residual hemorrhoidal skin tags                       K62.5, Hemorrhage of anus and rectum CPT copyright 2016 American [employer]. All rights reserved. The codes documented in this report are preliminary and upon coder review may  be revised to meet current compliance requirements. [name], MD 10/10/2015 2:37:26 PM This report has been signed electronically. Number of Addenda: 0 Note Initiated On: 10/10/2015 2:12 PM      [facility]

## 2015-10-10 NOTE — Progress Notes (Signed)
2nd enema infused at 1300. Pt has produced brown watery stool.

## 2015-10-10 NOTE — Discharge Summary (Signed)
Hector [name] at [place] NAME: Montreal Destefano    MR#:  [id number]  DATE OF BIRTH:  12/19/1965  DATE OF ADMISSION:  10/08/2015 ADMITTING PHYSICIAN: Harrie Foreman, MD  DATE OF DISCHARGE: 10/10/2015  PRIMARY CARE PHYSICIAN: [name], NP    ADMISSION DIAGNOSIS:  Gastrointestinal hemorrhage, unspecified gastritis, unspecified gastrointestinal hemorrhage type [K92.2]  DISCHARGE DIAGNOSIS:  Active Problems:   GI bleed   SECONDARY DIAGNOSIS:   Past Medical History:  Diagnosis Date  . Alcohol abuse   . 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 No inducible ischemia, fixed inferoapical defect = Infarct vs.artifact (not commensurate with extent of reduced EF); b) Echo 8/'12: Mild LV dilation, Mod-Severe global hypokinesis - EF 25-35%. Mild-moderate RV dilation no significant bowel lesion mild-moderate MR; c) Echo Jan 2016: EF 20-25%, high LAP, mild-moderate LV dilation, severe L  . Atrial fibrillation ([place])    a. new onset s/p unsuccessful TEE/DCCV on 08/16/2014; b. on apixaban   . Atrial flutter ([place])    a. new onset 07/2014; b. s/p unsuccessful TEE/DCCV 08/16/2014; c. on apixaban   . CHF (congestive heart failure) ([place])   . Chronic combined systolic and diastolic heart failure, NYHA class 2 ([place])    a. see above  . CKD (chronic kidney disease), stage III   . Elevated troponin    Chronically elevated. - level was 0.17-0.10 during recent admission  . Essential hypertension   . GI bleed 11/2013  . Hyperlipidemia   . Myocardial infarct ([place]) 2004  . Pancreatitis   . Sleep-disordered breathing    Has yet to have a sleep study    HOSPITAL COURSE:   50 year old male with past medical history of alcohol abuse, alcoholic cardiomyopathy, history of chronic atrial fibrillation, previous history of GI bleed, chronic kidney disease stage III, history of combined systolic diastolic CHF,  obstructive sleep apnea who presented to the hospital due to bright red blood per rectum.  1. GI bleed-this was a lower GI bleed given the patient's clinical symptoms. -Patient had no further episodes of hematochezia while in the hospital. He did not require any transfusions and his hemoglobin remained stable. -Patient was seen by gastroenterology and underwent a flexible sigmoidoscopy which showed hemorrhoids. -He is not presently being discharged on Analpram cream, Citrucel as needed. He will follow up with gastroenterology as an outpatient. -his Apixiban was held but he can resume that in the next 2-3 days.   2. History of chronic atrial fibrillation-he remained rate controlled while in the hospital. He will Continue amiodarone, carvedilol. - he will resume his Apixiban in the next 2-3 days.   3. History of combined diastolic/systolic CHF-clinically patient was not in congestive heart failure while in the hospital. - his BP was on the low side.  I have reduced his dose of Entresto, Coreg upon discharge.  He will cont. His Lasix.    4. Hypothyroidism - he will cont. Synthroid.   5. Hx of CKD Stage III - Cr. Close to baseline and can be further followed as outpatient.   6. Hyperlipidemia - he will cont. Atorvastatin.   DISCHARGE CONDITIONS:   Stable.   CONSULTS OBTAINED:  Treatment Team:  [name], MD  DRUG ALLERGIES:   Allergies  Allergen Reactions  . Eggs Or Egg-Derived Products Rash    DISCHARGE MEDICATIONS:     Medication List    STOP taking these medications   sacubitril-valsartan 97-103  MG Commonly known as:  ENTRESTO Replaced by:  sacubitril-valsartan 24-26 MG     TAKE these medications   amiodarone 200 MG tablet Commonly known as:  PACERONE Take 1 tablet (200 mg total) by mouth daily.   apixaban 5 MG Tabs tablet Commonly known as:  ELIQUIS Take 1 tablet (5 mg total) by mouth 2 (two) times daily.   atorvastatin 80 MG tablet Commonly known  as:  LIPITOR Take 1 tablet (80 mg total) by mouth daily.   carvedilol 3.125 MG tablet Commonly known as:  COREG Take 1 tablet (3.125 mg total) by mouth 2 (two) times daily with a meal. What changed:  medication strength  how much to take  additional instructions   furosemide 40 MG tablet Commonly known as:  LASIX Take 1 tablet (40 mg total) by mouth daily.   hydrocortisone-pramoxine 2.5-1 % rectal cream Commonly known as:  ANALPRAM HC Place 1 application rectally 3 (three) times daily.   levothyroxine 50 MCG tablet Commonly known as:  SYNTHROID Take 1 tablet (50 mcg total) by mouth daily before breakfast.   methylcellulose oral powder Commonly known as:  CITRUCEL Take 1 packet by mouth daily as needed.   omeprazole 20 MG capsule Commonly known as:  PRILOSEC Take 1 capsule (20 mg total) by mouth daily.   sacubitril-valsartan 24-26 MG Commonly known as:  ENTRESTO Take 1 tablet by mouth 2 (two) times daily. Replaces:  sacubitril-valsartan 97-103 MG         DISCHARGE INSTRUCTIONS:   DIET:  Cardiac diet  DISCHARGE CONDITION:  Stable  ACTIVITY:  Activity as tolerated  OXYGEN:  Home Oxygen: No.   Oxygen Delivery: room air  DISCHARGE LOCATION:  home   If you experience worsening of your admission symptoms, develop shortness of breath, life threatening emergency, suicidal or homicidal thoughts you must seek medical attention immediately by calling 911 or calling your MD immediately  if symptoms less severe.  You Must read complete instructions/literature along with all the possible adverse reactions/side effects for all the Medicines you take and that have been prescribed to you. Take any new Medicines after you have completely understood and accpet all the possible adverse reactions/side effects.   Please note  You were cared for by a hospitalist during your hospital stay. If you have any questions about your discharge medications or the care you received  while you were in the hospital after you are discharged, you can call the unit and asked to speak with the hospitalist on call if the hospitalist that took care of you is not available. Once you are discharged, your primary care physician will handle any further medical issues. Please note that NO REFILLS for any discharge medications will be authorized once you are discharged, as it is imperative that you return to your primary care physician (or establish a relationship with a primary care physician if you do not have one) for your aftercare needs so that they can reassess your need for medications and monitor your lab values.     Today   No complaints presently.  No further hematochezia.  S/p Flex Sig showing just hemorrhoids.  Will d/c home today.  VITAL SIGNS:  Blood pressure (!) 127/96, pulse 65, temperature 97.6 F (36.4 C), temperature source Oral, resp. rate 18, height 5\' 11"  (1.803 m), weight 134 kg (295 lb 8 oz), SpO2 98 %.  I/O:    Intake/Output Summary (Last 24 hours) at 10/10/15 1544 Last data filed at 10/10/15 1400  Gross  per 24 hour  Intake             1270 ml  Output              810 ml  Net              460 ml    PHYSICAL EXAMINATION:  GENERAL:  50 y.o.-year-old patient lying in the bed with no acute distress.  EYES: Pupils equal, round, reactive to light and accommodation. No scleral icterus. Extraocular muscles intact.  HEENT: Head atraumatic, normocephalic. Oropharynx and nasopharynx clear.  NECK:  Supple, no jugular venous distention. No thyroid enlargement, no tenderness.  LUNGS: Normal breath sounds bilaterally, no wheezing, rales,rhonchi. No use of accessory muscles of respiration.  CARDIOVASCULAR: S1, S2 normal. No murmurs, rubs, or gallops.  ABDOMEN: Soft, non-tender, non-distended. Bowel sounds present. No organomegaly or mass.  EXTREMITIES: No pedal edema, cyanosis, or clubbing.  NEUROLOGIC: Cranial nerves II through XII are intact. No focal motor or  sensory defecits b/l.  PSYCHIATRIC: The patient is alert and oriented x 3. Good affect.  SKIN: No obvious rash, lesion, or ulcer.   DATA REVIEW:   CBC  Recent Labs Lab 10/10/15 0344  WBC 7.5  HGB 13.4  HCT 40.5  PLT 199    Chemistries   Recent Labs Lab 10/08/15 0422 10/10/15 0344  NA 140 140  K 4.1 3.7  CL 109 107  CO2 24 26  GLUCOSE 143* 103*  BUN 24* 24*  CREATININE 2.61* 2.41*  CALCIUM 8.4* 7.6*  AST 49*  --   ALT 24  --   ALKPHOS 48  --   BILITOT 1.0  --     Cardiac Enzymes No results for input(s): TROPONINI in the last 168 hours.  Microbiology Results  Results for orders placed or performed during the hospital encounter of 08/14/14  Urine culture     Status: None   Collection Time: 08/15/14  8:52 AM  Result Value Ref Range Status   Specimen Description URINE, RANDOM  Final   Special Requests NONE  Final   Culture NO GROWTH 1 DAY  Final   Report Status 08/16/2014 FINAL  Final    RADIOLOGY:  No results found.    Management plans discussed with the patient, family and they are in agreement.  CODE STATUS:     Code Status Orders        Start     Ordered   10/08/15 0753  Full code  Continuous     10/08/15 0752    Code Status History    Date Active Date Inactive Code Status Order ID Comments User Context   01/09/2015 11:06 AM 01/09/2015  6:00 PM Full Code [id number]  [name], MD Inpatient   08/14/2014  3:01 PM 08/19/2014  2:05 PM Full Code [id number]  Henreitta Leber, MD Inpatient      TOTAL TIME TAKING CARE OF THIS PATIENT: 40 minutes.    Henreitta Leber M.D on 10/10/2015 at 3:44 PM  Between 7am to 6pm - Pager - [phone]  After 6pm go to [email or web address] - Proofreader  [facility]  Office  [phone]  CC: Primary care physician; [name], NP

## 2015-10-11 ENCOUNTER — Encounter: Payer: Self-pay | Admitting: Gastroenterology

## 2015-12-04 ENCOUNTER — Ambulatory Visit: Payer: Medicaid Other | Attending: Family | Admitting: Family

## 2015-12-04 ENCOUNTER — Encounter: Payer: Self-pay | Admitting: Family

## 2015-12-04 VITALS — BP 160/108 | HR 58 | Resp 20 | Ht 71.0 in | Wt 308.0 lb

## 2015-12-04 DIAGNOSIS — I5032 Chronic diastolic (congestive) heart failure: Secondary | ICD-10-CM

## 2015-12-04 DIAGNOSIS — I5042 Chronic combined systolic (congestive) and diastolic (congestive) heart failure: Secondary | ICD-10-CM | POA: Insufficient documentation

## 2015-12-04 DIAGNOSIS — Z79899 Other long term (current) drug therapy: Secondary | ICD-10-CM | POA: Insufficient documentation

## 2015-12-04 DIAGNOSIS — I4891 Unspecified atrial fibrillation: Secondary | ICD-10-CM | POA: Diagnosis not present

## 2015-12-04 DIAGNOSIS — E785 Hyperlipidemia, unspecified: Secondary | ICD-10-CM | POA: Insufficient documentation

## 2015-12-04 DIAGNOSIS — I10 Essential (primary) hypertension: Secondary | ICD-10-CM

## 2015-12-04 DIAGNOSIS — N183 Chronic kidney disease, stage 3 (moderate): Secondary | ICD-10-CM | POA: Insufficient documentation

## 2015-12-04 DIAGNOSIS — Z87891 Personal history of nicotine dependence: Secondary | ICD-10-CM | POA: Diagnosis not present

## 2015-12-04 DIAGNOSIS — R001 Bradycardia, unspecified: Secondary | ICD-10-CM | POA: Insufficient documentation

## 2015-12-04 DIAGNOSIS — I130 Hypertensive heart and chronic kidney disease with heart failure and stage 1 through stage 4 chronic kidney disease, or unspecified chronic kidney disease: Secondary | ICD-10-CM | POA: Diagnosis not present

## 2015-12-04 DIAGNOSIS — I480 Paroxysmal atrial fibrillation: Secondary | ICD-10-CM

## 2015-12-04 DIAGNOSIS — Z7901 Long term (current) use of anticoagulants: Secondary | ICD-10-CM | POA: Diagnosis not present

## 2015-12-04 DIAGNOSIS — R0683 Snoring: Secondary | ICD-10-CM | POA: Insufficient documentation

## 2015-12-04 MED ORDER — SACUBITRIL-VALSARTAN 97-103 MG PO TABS: 1.0000 | ORAL_TABLET | Freq: Two times a day (BID) | ORAL | 5 refills | Status: DC

## 2015-12-04 MED ORDER — SACUBITRIL-VALSARTAN 49-51 MG PO TABS: 1.0000 | ORAL_TABLET | Freq: Two times a day (BID) | ORAL | 0 refills | Status: DC

## 2015-12-04 MED ORDER — ATORVASTATIN CALCIUM 80 MG PO TABS: 80.0000 mg | ORAL_TABLET | Freq: Every day | ORAL | 5 refills | Status: DC

## 2015-12-04 MED ORDER — SACUBITRIL-VALSARTAN 24-26 MG PO TABS
1.0000 | ORAL_TABLET | Freq: Two times a day (BID) | ORAL | 1 refills | Status: DC
Start: 2015-12-04 — End: 2015-12-04

## 2015-12-04 NOTE — Progress Notes (Signed)
[name], male    DOB: Oct 11, 1965, 50 y.o.   MRN: [id number]  HPI  Mr Hector [name] is a 50 y/o male with a history of MI, hyperlipidemia, HTN, CKD, atrial fibrillation, alcohol use and cardiomyopathy.  Last echo was done 02/21/15 which showed a normalization of his EF which is now 50-55% with mild MR. No longer ICD candidate. Prior echo on 03/07/14 showed an EF of 20-25% with mild/mod MR. He was started on [facility] Feb 2016. Last left heart cath was done 01/09/15.   Was last admitted to [facility] on 10/08/15 with a GI bleed. GI was consulted and a flex sigmoidoscopy was done which showed hemorrhoids.   He presents today for a follow-up visit with some fatigue, snoring and shortness of breath upon exertion. Denies any swelling in his legs/abdomen. Hasn't been weighing consistently but does know that he's gained some weight. He says that he's been out of his entresto for at least 8 days. Has been following with nephrology but does not have an appointment with his cardiologist.   Past Medical History:  Diagnosis Date  . Alcohol abuse   . 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 No inducible ischemia, fixed inferoapical defect = Infarct vs.artifact (not commensurate with extent of reduced EF); b) Echo 8/'12: Mild LV dilation, Mod-Severe global hypokinesis - EF 25-35%. Mild-moderate RV dilation no significant bowel lesion mild-moderate MR; c) Echo Jan 2016: EF 20-25%, high LAP, mild-moderate LV dilation, severe L  . Atrial fibrillation ([name])    a. new onset s/p unsuccessful TEE/DCCV on 08/16/2014; b. on apixaban   . Atrial flutter ([name])    a. new onset 07/2014; b. s/p unsuccessful TEE/DCCV 08/16/2014; c. on apixaban   . CHF (congestive heart failure) ([name])   . Chronic combined systolic and diastolic heart failure, NYHA class 2 ([name])    a. see above  . CKD (chronic kidney disease), stage III   . Elevated troponin    Chronically  elevated. - level was 0.17-0.10 during recent admission  . Essential hypertension   . GI bleed 11/2013  . Hyperlipidemia   . Myocardial infarct 2004  . Pancreatitis   . Sleep-disordered breathing    Has yet to have a sleep study    Past Surgical History:  Procedure Laterality Date  . CARDIAC CATHETERIZATION N/A 01/09/2015   Procedure: Left Heart Cath and Coronary Angiography;  Surgeon: [name], MD;  Location: [facility] CV LAB;  Service: Cardiovascular;  Laterality: N/A;  . ELECTROPHYSIOLOGIC STUDY N/A 08/16/2014   Procedure: CARDIOVERSION;  Surgeon: [name], MD;  Location: [facility] ORS;  Service: Cardiovascular;  Laterality: N/A;  . FLEXIBLE SIGMOIDOSCOPY N/A 10/10/2015   Procedure: FLEXIBLE SIGMOIDOSCOPY;  Surgeon: [name], MD;  Location: [facility] ENDOSCOPY;  Service: Endoscopy;  Laterality: N/A;  . KNEE SURGERY Right   . NM MYOVIEW LTD  November 2011   No ischemia or infarction. EF 50-55% ( no improvement from 2009 Myoview EF of 28%  . TEE WITHOUT CARDIOVERSION N/A 08/16/2014   Procedure: TRANSESOPHAGEAL ECHOCARDIOGRAM (TEE);  Surgeon: [name], MD;  Location: [facility] ORS;  Service: Cardiovascular;  Laterality: N/A;    Family History  Problem Relation Age of Onset  . Hypertension Mother   . Hyperlipidemia Mother   . Diabetes Mother     Social History  Substance Use Topics  . Smoking status: Former Smoker    Packs/day: 1.00    Years: 12.00  Quit date: 02/23/1998  . Smokeless tobacco: Never Used  . Alcohol use No     Comment: 4 heavy alcohol drinker. Quit 2 years ago.    Allergies  Allergen Reactions  . Eggs Or Egg-Derived Products Rash    Prior to Admission medications   Medication Sig Start Date End Date Taking? Authorizing Provider  amiodarone (PACERONE) 200 MG tablet Take 1 tablet (200 mg total) by mouth daily. 05/22/15  Yes [name], MD  apixaban (ELIQUIS) 5 MG TABS tablet Take 1 tablet (5 mg total) by mouth 2 (two) times daily.  09/12/14  Yes Alisa Graff, FNP  carvedilol (COREG) 3.125 MG tablet Take 1 tablet (3.125 mg total) by mouth 2 (two) times daily with a meal. 10/10/15  Yes Henreitta Leber, MD  furosemide (LASIX) 40 MG tablet Take 1 tablet (40 mg total) by mouth daily. 05/22/15  Yes [name], MD  hydrocortisone-pramoxine [facility]) 2.5-1 % rectal cream Place 1 application rectally 3 (three) times daily. 10/10/15  Yes Henreitta Leber, MD  levothyroxine (SYNTHROID) 50 MCG tablet Take 1 tablet (50 mcg total) by mouth daily before breakfast. 06/18/15  Yes [name], MD  methylcellulose (CITRUCEL) oral powder Take 1 packet by mouth daily as needed. 10/10/15  Yes Henreitta Leber, MD  omeprazole (PRILOSEC) 20 MG capsule Take 1 capsule (20 mg total) by mouth daily. 05/22/15  Yes [name], MD  atorvastatin (LIPITOR) 80 MG tablet Take 1 tablet (80 mg total) by mouth daily. 12/04/15   Alisa Graff, FNP  sacubitril-valsartan (ENTRESTO) 49-51 MG Take 1 tablet by mouth 2 (two) times daily. 12/04/15   Alisa Graff, FNP  sacubitril-valsartan (ENTRESTO) 97-103 MG Take 1 tablet by mouth 2 (two) times daily. 12/04/15   Alisa Graff, FNP     Review of Systems  Constitutional: Positive for fatigue. Negative for appetite change.  HENT: Negative for congestion, postnasal drip and sore throat.   Eyes: Negative.   Respiratory: Positive for shortness of breath and wheezing. Negative for cough and chest tightness.   Cardiovascular: Negative for chest pain, palpitations and leg swelling.  Gastrointestinal: Negative for abdominal distention and abdominal pain.  Endocrine: Negative.   Genitourinary: Negative.   Musculoskeletal: Negative for back pain and neck pain.  Skin: Negative.   Allergic/Immunologic: Negative.   Neurological: Negative for dizziness and light-headedness.  Hematological: Negative for adenopathy. Does not bruise/bleed easily.  Psychiatric/Behavioral: Negative for dysphoric mood and sleep  disturbance (sleeping on 2 pillows).    Vitals:   12/04/15 0955 12/04/15 1009  BP: (!) 183/124 (!) 160/108  Pulse: (!) 58   Resp: 20   SpO2: 94%   Weight: (!) 308 lb (139.7 kg)   Height: 5\' 11"  (1.803 m)      Physical Exam  Constitutional: He is oriented to person, place, and time. He appears well-developed and well-nourished.  HENT:  Head: Normocephalic and atraumatic.  Eyes: Conjunctivae are normal. Pupils are equal, round, and reactive to light.  Neck: Normal range of motion. Neck supple.  Cardiovascular: An irregular rhythm present. Bradycardia present.   Pulmonary/Chest: Effort normal. He has no wheezes. He has no rales.  Abdominal: Soft. He exhibits no distension. There is no tenderness.  Musculoskeletal: He exhibits no edema or tenderness.  Neurological: He is alert and oriented to person, place, and time.  Skin: Skin is warm and dry.  Psychiatric: He has a normal mood and affect. His behavior is normal. Thought content normal.  Nursing note and vitals reviewed.  Assessment & Plan:  1: Combined systolic/diastolic heart failure- - NYHA Class II - moderate fluid overload today - weight up 14.8 pounds since he was last here on 09/03/15. Resume weighing daily and call for overnight weight gain of >2 pounds or a weekly weight gain of >5 pounds. He is to also to double his furosemide to 40mg  twice daily for the next 3 days. Monitor sodium intake carefully - Resume entresto with samples of 49/51mg  twice daily along with a prescription sent in to local pharmacy for 97/103mg  dosage. Discussed my concern that insurance may no longer cover entresto due to normalization of his EF.  - PharmD went in and reviewed medications - Appointment scheduled with Dr. Yvone Neu on 12/11/15.   2: HTN- - BP elevated even after recheck with manual cuff - Has been out of entresto for >1 week, resuming per above - Getting rechecked next week at cardiology - Check BP at local pharmacy daily and call if  it's not coming down  3: Atrial fibrillation- - Bradycardic today, taking amiodarone 200mg  daily along with carvedilol 3.125mg  twice daily  4: Snoring- - Order for a sleep study faxed in  Since seeing cardiology next week, will have him return here in 2 weeks or sooner for any questions/problems before then.

## 2015-12-04 NOTE — Patient Instructions (Addendum)
Resume weighing daily and call for an overnight weight gain of > 2 pounds or a weekly weight gain of >5 pounds.  Samples provided of Entresto 49/51mg  to begin taking 1 tablet twice daily while insurance is being worked on to get the 97/103mg  dosage approved.  Take your furosemide (lasix) twice daily for the next 3 days

## 2015-12-11 ENCOUNTER — Ambulatory Visit (INDEPENDENT_AMBULATORY_CARE_PROVIDER_SITE_OTHER): Payer: Medicaid Other | Admitting: Cardiology

## 2015-12-11 ENCOUNTER — Encounter: Payer: Self-pay | Admitting: Cardiology

## 2015-12-11 VITALS — BP 180/120 | HR 54 | Ht 71.0 in | Wt 301.4 lb

## 2015-12-11 DIAGNOSIS — I480 Paroxysmal atrial fibrillation: Secondary | ICD-10-CM

## 2015-12-11 DIAGNOSIS — IMO0001 Reserved for inherently not codable concepts without codable children: Secondary | ICD-10-CM

## 2015-12-11 DIAGNOSIS — Z6841 Body Mass Index (BMI) 40.0 and over, adult: Secondary | ICD-10-CM

## 2015-12-11 DIAGNOSIS — I10 Essential (primary) hypertension: Secondary | ICD-10-CM | POA: Diagnosis not present

## 2015-12-11 DIAGNOSIS — R001 Bradycardia, unspecified: Secondary | ICD-10-CM

## 2015-12-11 DIAGNOSIS — I428 Other cardiomyopathies: Secondary | ICD-10-CM

## 2015-12-11 DIAGNOSIS — E784 Other hyperlipidemia: Secondary | ICD-10-CM

## 2015-12-11 DIAGNOSIS — I5032 Chronic diastolic (congestive) heart failure: Secondary | ICD-10-CM | POA: Diagnosis not present

## 2015-12-11 DIAGNOSIS — E6609 Other obesity due to excess calories: Secondary | ICD-10-CM

## 2015-12-11 DIAGNOSIS — E7849 Other hyperlipidemia: Secondary | ICD-10-CM

## 2015-12-11 MED ORDER — VALSARTAN 80 MG PO TABS: 80.0000 mg | ORAL_TABLET | Freq: Every day | ORAL | 6 refills | Status: DC

## 2015-12-11 MED ORDER — AMLODIPINE BESYLATE 5 MG PO TABS: 5.0000 mg | ORAL_TABLET | Freq: Every day | ORAL | 6 refills | Status: DC

## 2015-12-11 NOTE — Progress Notes (Signed)
Cardiology Office Note   Date:  12/11/2015   ID:  Hector [name], DOB 19-Oct-1965, MRN [id number]  Referring Doctor:  [name], NP   Cardiologist:   Wende Bushy, MD   Reason for consultation:  Chief Complaint  Patient presents with  . Chronic diastolic heart failure ([place])  . Essential hypertension  . Paroxysmal atrial fibrillation (HCC)      History of Present Illness: Hector [name] is a 50 y.o. male who presents for Follow up. Patient used to see Dr. Alfonse Spruce is a 50 y.o. male with a PMH below who presents today for cardiology followup for her heart failure (combined systolic and diastolic) in the context of atrial flutter/fibrillation. He apparently has a significant history of alcoholic (nonischemic) cardiomyopathy dating back 2009 - with an echocardiogram showing an EF as low as 20%. Apparently this improved after he quit drinking alcohol.  He was actually admitted to the hospital at [facility] August 14 2014 with atrial flutter with rapid response. He was cardioverted(TEE/DCCV), then reverted to atrial fibrillation and was started on amiodarone as well as Eliquis.   He saw Dr. [name] from electrophysiology in the clinic on August 21 2014 to determine further treatment options for atrial fibrillation/flutter as well as consider possibility of ICD referral.  Dr. [name] recommendation was to continue the amiodarone as he would not likely be a good candidate for ablative procedures to be discussed consider the possibility of cardiac catheterization to exclude an ischemic component of cardiomyopathy. And also agreed that he would be an appropriate candidate for ICD if indicated.   An echo December 2016 showed recovery of systolic function. Patient denies chest pain and shortness of breath. He struggles with continued blood pressure elevation. He has not been able to take the higher dose of Entresto and has been relying on samples of the lower dose.  His blood pressure has been noted to be elevated on the lower dose, better on the higher dose. Insurance is not coughing this medication according to the patient.  Patient was set up to go for sleep study and is awaiting a schedule.  Patient denies PND, orthopnea, edema. No angina, no palpitations, no headaches.   ROS:  Please see the history of present illness. Aside from mentioned under HPI, all other systems are reviewed and negative.     Past Medical History:  Diagnosis Date  . Alcohol abuse   . 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 No inducible ischemia, fixed inferoapical defect = Infarct vs.artifact (not commensurate with extent of reduced EF); b) Echo 8/'12: Mild LV dilation, Mod-Severe global hypokinesis - EF 25-35%. Mild-moderate RV dilation no significant bowel lesion mild-moderate MR; c) Echo Jan 2016: EF 20-25%, high LAP, mild-moderate LV dilation, severe L  . Atrial fibrillation ([place])    a. new onset s/p unsuccessful TEE/DCCV on 08/16/2014; b. on apixaban   . Atrial flutter ([place])    a. new onset 07/2014; b. s/p unsuccessful TEE/DCCV 08/16/2014; c. on apixaban   . CHF (congestive heart failure) ([place])   . Chronic combined systolic and diastolic heart failure, NYHA class 2 ([place])    a. see above  . CKD (chronic kidney disease), stage III   . Elevated troponin    Chronically elevated. - level was 0.17-0.10 during recent admission  . Essential hypertension   . GI bleed 11/2013  . Hyperlipidemia   . Myocardial infarct 2004  .  Pancreatitis   . Sleep-disordered breathing    Has yet to have a sleep study    Past Surgical History:  Procedure Laterality Date  . CARDIAC CATHETERIZATION N/A 01/09/2015   Procedure: Left Heart Cath and Coronary Angiography;  Surgeon: [name], MD;  Location: [facility];  Service: Cardiovascular;  Laterality: N/A;  . ELECTROPHYSIOLOGIC STUDY N/A 08/16/2014   Procedure:  CARDIOVERSION;  Surgeon: [name], MD;  Location: [facility] ORS;  Service: Cardiovascular;  Laterality: N/A;  . FLEXIBLE SIGMOIDOSCOPY N/A 10/10/2015   Procedure: FLEXIBLE SIGMOIDOSCOPY;  Surgeon: [name], MD;  Location: [facility] ENDOSCOPY;  Service: Endoscopy;  Laterality: N/A;  . KNEE SURGERY Right   . NM MYOVIEW LTD  November 2011   No ischemia or infarction. EF 50-55% ( no improvement from 2009 Myoview EF of 28%  . TEE WITHOUT CARDIOVERSION N/A 08/16/2014   Procedure: TRANSESOPHAGEAL ECHOCARDIOGRAM (TEE);  Surgeon: [name], MD;  Location: [facility] ORS;  Service: Cardiovascular;  Laterality: N/A;     reports that he quit smoking about 17 years ago. He has a 12.00 pack-year smoking history. He has never used smokeless tobacco. He reports that he does not drink alcohol or use drugs.   family history includes Diabetes in his mother; Hyperlipidemia in his mother; Hypertension in his mother.   Outpatient Medications Prior to Visit  Medication Sig Dispense Refill  . amiodarone (PACERONE) 200 MG tablet Take 1 tablet (200 mg total) by mouth daily. 60 tablet 3  . apixaban (ELIQUIS) 5 MG TABS tablet Take 1 tablet (5 mg total) by mouth 2 (two) times daily. 60 tablet 5  . atorvastatin (LIPITOR) 80 MG tablet Take 1 tablet (80 mg total) by mouth daily. 30 tablet 5  . carvedilol (COREG) 3.125 MG tablet Take 1 tablet (3.125 mg total) by mouth 2 (two) times daily with a meal. 60 tablet 1  . furosemide (LASIX) 40 MG tablet Take 1 tablet (40 mg total) by mouth daily. 30 tablet 5  . hydrocortisone-pramoxine (ANALPRAM HC) 2.5-1 % rectal cream Place 1 application rectally 3 (three) times daily. 30 g 0  . levothyroxine (SYNTHROID) 50 MCG tablet Take 1 tablet (50 mcg total) by mouth daily before breakfast. 30 tablet 5  . methylcellulose (CITRUCEL) oral powder Take 1 packet by mouth daily as needed. 245 tablet 0  . omeprazole (PRILOSEC) 20 MG capsule Take 1 capsule (20 mg total) by mouth daily. 30  capsule 5  . sacubitril-valsartan (ENTRESTO) 49-51 MG Take 1 tablet by mouth 2 (two) times daily. 42 tablet 0  . sacubitril-valsartan (ENTRESTO) 97-103 MG Take 1 tablet by mouth 2 (two) times daily. 60 tablet 5   No facility-administered medications prior to visit.      Allergies: Eggs or egg-derived products    PHYSICAL EXAM: VS:  BP (!) 180/120 (BP Location: Right Arm, Patient Position: Sitting, Cuff Size: Large)   Pulse (!) 54   Ht 5\' 11"  (1.803 m)   Wt (!) 301 lb 6.4 oz (136.7 kg)   SpO2 95%   BMI 42.04 kg/m  , Body mass index is 42.04 kg/m. Wt Readings from Last 3 Encounters:  12/11/15 (!) 301 lb 6.4 oz (136.7 kg)  12/04/15 (!) 308 lb (139.7 kg)  10/10/15 295 lb 8 oz (134 kg)    GENERAL:  well developed, well nourished,Morbidly obese, not in acute distress HEENT: normocephalic, pink conjunctivae, anicteric sclerae, no xanthelasma, normal dentition, oropharynx clear NECK:  no neck vein engorgement, JVP normal, no  hepatojugular reflux, carotid upstroke brisk and symmetric, no bruit, no thyromegaly, no lymphadenopathy LUNGS:  good respiratory effort, clear to auscultation bilaterally CV:  PMI not displaced, no thrills, no lifts, S1 and S2 within normal limits, no palpable S3 or S4, no murmurs, no rubs, no gallops ABD:  Soft, nontender, nondistended, normoactive bowel sounds, no abdominal aortic bruit, no hepatomegaly, no splenomegaly MS: nontender back, no kyphosis, no scoliosis, no joint deformities EXT:  2+ DP/PT pulses, no edema, no varicosities, no cyanosis, no clubbing SKIN: warm, nondiaphoretic, normal turgor, no ulcers NEUROPSYCH: alert, oriented to person, place, and time, sensory/motor grossly intact, normal mood, appropriate affect  Recent Labs: 10/08/2015: ALT 24; TSH 47.715 10/10/2015: BUN 24; Creatinine, Ser 2.41; Hemoglobin 13.4; Platelets 199; Potassium 3.7; Sodium 140   Lipid Panel    Component Value Date/Time   CHOL 216 (H) 05/22/2015 0905   CHOL 118  03/08/2014 0410   TRIG 198 (H) 05/22/2015 0905   TRIG 95 03/08/2014 0410   HDL 37 (L) 05/22/2015 0905   HDL 28 (L) 03/08/2014 0410   CHOLHDL 5.8 (H) 05/22/2015 0905   VLDL 19 03/08/2014 0410   LDLCALC 139 (H) 05/22/2015 0905   LDLCALC 71 03/08/2014 0410     Other studies Reviewed:  EKG:  The ekg from 12/11/2015 was personally reviewed by me and it revealed sinus bradycardia, 56 BPM. Incomplete left bundle branch block. QT is 480 ms, QTC 473 ms. Can change from EKG 10/08/2015.  Additional studies/ records that were reviewed personally reviewed by me today include:   2D Echo 1/13/'16:  ? Severely reduced EF of 20-25%. Elevated LAP, impaired LV relaxation (GR 1 DD) mild-moderate LV dilation with severe LA dilation. Mild-mild MR and TR.  TEE/DCCV 6/23/'16:      Procedures    Left Heart Cath and Coronary Angiography : 01/09/2015    Conclusion    1. Angiographically minimal CAD 2. Non-ishcemic Cardiomyopathy 3. Systemic Hypertension with Mild-Moderately increase LVEDP after pre-cath hydration. This confirms the presumed diagnosis of Non-ischemic Cardiomyopathy. Plan:  Discharge home after Bed-rest & TR band removal  Continue current Cardiomyopathy Medications - BB, Entresto & Lasix  Will refer back to Dr. Caryl Comes for reconsideration of ICD.   Echo 02/21/2015 - Challenging image quality. Rhythm is normal sinus. - Left ventricle: The cavity size was normal. There was mild concentric hypertrophy. Systolic function was normal. EF ~50% to 55%.  Unable to exclude inferior-posterior wall hypokinesis. Left ventricular diastolic function parameters were normal. - Mitral valve: There was mild regurgitation. - Left atrium: The atrium was mildly dilated. - Right ventricle: Systolic function was normal. - Pulmonary arteries: Systolic pressure was within the normal  range.    ASSESSMENT AND PLAN:  Congestive heart failure, recovered systolic, Persistent diastolic dysfunction, Not  in acute decompensation, chronic CHF Nonischemic cardiomyopathy based on negative CAD on left heart catheterization History of alcohol abuse leading to nonischemic cardiomyopathy Cardiomyopathy likely related to uncontrolled hypertension Hypertension, uncontrolled Entresto will likely not be covered by insurance due to CHF is not systolic in nature. We will convert to valsartan 80 mg daily. We'll add amlodipine 5 mg by mouth daily. Patient should continue low-dose carvedilol 3.125 twice a day. Continue Lasix 40 mg daily. Recommend close monitoring of blood pressure. Patient advised to obtain blood pressure monitor and write down blood pressure measurements to bring to him on his next follow-up. Blood pressure goal is less than 140/80. Lengthy discussion on lifestyle modification especially dietary changes, low sodium, heart healthy diet, increased  physical activity will monitoring blood pressure. We will order an echocardiogram on his next follow-up once blood pressure is better controlled. We will start workup for secondary causes of hypertension to include fractionated plasma metanephrines, PA-C/PRA, renal artery ultrasound to rule out renal artery stenosis if technically feasible. Follow-up on sleep study scheduled. Patient advised to follow-up with PCP for hypothyroidism.  Paroxysmal atrial flutter Paroxysmal atrial fibrillation In sinus rhythm QT acceptable. Continue amiodarone 200 mg once a day, and [place] 5 mg twice a day. Patient follows up with Dr. Caryl Comes as well.  Hyperlipidemia Patient on statin therapy, PCP following labs.  Sinus bradycardia Patient is on lowest dose of carvedilol. Patient on amiodarone. No symptoms associated with the bradycardia. QTC is acceptable.  Morbid obesity Body mass index is 42.04 kg/m.Marland Kitchen Recommend aggressive weight loss through diet and increased physical activity.     Current medicines are reviewed at length with the patient today.  The patient  does not have concerns regarding medicines.  Labs/ tests ordered today include:  Orders Placed This Encounter  Procedures  . Metanephrines, plasma  . Aldosterone + renin activity w/ ratio  . EKG 12-Lead    I had a lengthy and detailed discussion with the patient regarding diagnoses, prognosis, diagnostic options, treatment options , and side effects of medications.   I counseled the patient on importance of lifestyle modification including heart healthy diet, regular physical activity .   Disposition:   FU with undersigned in one month  Patient has multiple cardiac diagnosis and medical comorbidities, requiring high level medical decision making.  Signed, Wende Bushy, MD  12/11/2015 10:14 AM    Arial  This note was generated in part with voice recognition software and I apologize for any typographical errors that were not detected and corrected.

## 2015-12-11 NOTE — Patient Instructions (Signed)
Medication Instructions:  Your physician has recommended you make the following change in your medication:  1. STOP taking entresto 2. START Valsartan 80 mg Once daily 3. START Amlodipine 5 mg Once daily   Labwork: Your physician recommends that you go to [facility] entrance to have some special labs done. Check in at first desk on the right and take lab slips provided.    Testing/Procedures: Your physician has requested that you have a renal artery duplex. During this test, an ultrasound is used to evaluate blood flow to the kidneys. Allow one hour for this exam. Do not eat after midnight the day before and avoid carbonated beverages. Take your medications as you usually do.   Follow-Up: Your physician recommends that you schedule a follow-up appointment in: 1 month with Dr. Yvone Neu. Please make sure you bring your blood pressure log with you to this appointment.   Make sure to follow up on sleep study.  Please see primary care physician regarding TSH (thyroid function) test.     It was a pleasure seeing you today here in the office. Please do not hesitate to give [place] a call back if you have any further questions. [place], BSN

## 2015-12-12 ENCOUNTER — Other Ambulatory Visit
Admission: RE | Admit: 2015-12-12 | Discharge: 2015-12-12 | Disposition: A | Discharge status: Home / Self Care | Payer: Medicaid Other | Source: Ambulatory Visit | Attending: Cardiology | Admitting: Cardiology

## 2015-12-12 ENCOUNTER — Telehealth: Payer: Self-pay | Admitting: Cardiology

## 2015-12-12 DIAGNOSIS — E785 Hyperlipidemia, unspecified: Secondary | ICD-10-CM | POA: Diagnosis not present

## 2015-12-12 DIAGNOSIS — I480 Paroxysmal atrial fibrillation: Secondary | ICD-10-CM | POA: Insufficient documentation

## 2015-12-12 DIAGNOSIS — Z01812 Encounter for preprocedural laboratory examination: Secondary | ICD-10-CM

## 2015-12-12 DIAGNOSIS — I5032 Chronic diastolic (congestive) heart failure: Secondary | ICD-10-CM | POA: Diagnosis present

## 2015-12-12 DIAGNOSIS — I10 Essential (primary) hypertension: Secondary | ICD-10-CM | POA: Diagnosis not present

## 2015-12-12 LAB — BASIC METABOLIC PANEL
ANION GAP: 7 (ref 5–15)
BUN: 23 mg/dL — AB (ref 6–20)
CO2: 24 mmol/L (ref 22–32)
Calcium: 8.4 mg/dL — ABNORMAL LOW (ref 8.9–10.3)
Chloride: 107 mmol/L (ref 101–111)
Creatinine, Ser: 2.68 mg/dL — ABNORMAL HIGH (ref 0.61–1.24)
GFR calc Af Amer: 30 mL/min — ABNORMAL LOW (ref >60)
GFR calc non Af Amer: 26 mL/min — ABNORMAL LOW (ref >60)
GLUCOSE: 133 mg/dL — AB (ref 65–99)
POTASSIUM: 4.1 mmol/L (ref 3.5–5.1)
Sodium: 138 mmol/L (ref 135–145)

## 2015-12-12 LAB — CBC
HEMATOCRIT: 44.7 % (ref 40.0–52.0)
HEMOGLOBIN: 14.3 g/dL (ref 13.0–18.0)
MCH: 28.4 pg (ref 26.0–34.0)
MCHC: 32 g/dL (ref 32.0–36.0)
MCV: 88.7 fL (ref 80.0–100.0)
Platelets: 202 10*3/uL (ref 150–440)
RBC: 5.03 MIL/uL (ref 4.40–5.90)
RDW: 15.2 % — ABNORMAL HIGH (ref 11.5–14.5)
WBC: 6.1 10*3/uL (ref 3.8–10.6)

## 2015-12-12 LAB — LIPID PANEL
CHOL/HDL RATIO: 6.9 ratio
Cholesterol: 387 mg/dL — ABNORMAL HIGH (ref 0–200)
HDL: 56 mg/dL (ref >40)
LDL CALC: 287 mg/dL — AB (ref 0–99)
Triglycerides: 218 mg/dL — ABNORMAL HIGH (ref <150)
VLDL: 44 mg/dL — AB (ref 0–40)

## 2015-12-12 LAB — PROTIME-INR
INR: 1.05
Prothrombin Time: 13.7 seconds (ref 11.4–15.2)

## 2015-12-12 NOTE — Telephone Encounter (Signed)
Pharmacist calling to verify medications. Let him know that Delene Loll was discontinued and she started him on valsartan and amlodipine. He verbalized understanding and had no further questions at this time.

## 2015-12-12 NOTE — Telephone Encounter (Signed)
Pharmacist is calling regarding [place] that was sent yesterday, states Darylene Price, NP has ordered [place] also. He asks if pt should be taking both. Please call and advise.

## 2015-12-16 LAB — ALDOSTERONE + RENIN ACTIVITY W/ RATIO
ALDO / PRA RATIO: 0.9 (ref 0.0–30.0)
Aldosterone: 4.4 ng/dL (ref 0.0–30.0)
PRA LC/MS/MS: 4.861 ng/mL/hr (ref 0.167–5.380)

## 2015-12-17 ENCOUNTER — Ambulatory Visit: Payer: Medicaid Other | Admitting: Family

## 2015-12-17 ENCOUNTER — Telehealth: Payer: Self-pay | Admitting: Family

## 2015-12-17 NOTE — Telephone Encounter (Signed)
Patient did not show for his [facility] Failure [facility] appointment on 12/17/15. Will attempt to reschedule.

## 2015-12-18 ENCOUNTER — Telehealth: Payer: Self-pay | Admitting: *Deleted

## 2015-12-18 ENCOUNTER — Encounter: Payer: Self-pay | Admitting: *Deleted

## 2015-12-18 DIAGNOSIS — I10 Essential (primary) hypertension: Secondary | ICD-10-CM

## 2015-12-18 LAB — METANEPHRINES, PLASMA
METANEPHRINE FREE: 27 pg/mL (ref 0–62)
Normetanephrine, Free: 188 pg/mL — ABNORMAL HIGH (ref 0–145)

## 2015-12-18 NOTE — Telephone Encounter (Signed)
-----   Message from Wende Bushy, MD sent at 12/18/2015 10:24 AM EDT ----- Indeterminate levels, not conclusive. rec rpt tests in 4 weeks, early am testing

## 2015-12-18 NOTE — Telephone Encounter (Signed)
Reviewed results with patient and recommendations to repeat testing early in the morning in 4 weeks per Dr. Yvone Neu. He verbalized understanding and had no further questions. Will mail him the lab slips with instructions on when to go have them done and where.

## 2015-12-19 ENCOUNTER — Telehealth: Payer: Self-pay | Admitting: *Deleted

## 2015-12-19 NOTE — Telephone Encounter (Signed)
Spoke to patient. Patient states he is being followed by Dr Yvone Neu  For cardiology now. Patient states he was not fasting the day he had labwork.  patient states he is using atorvastatin daily. Patient also states Ellin Mayhew NP - recently did another set of labs, and patient will be doing some more next week . Informed patient will defer to cardiology and route primary for further insruction.

## 2015-12-19 NOTE — Telephone Encounter (Signed)
-----   Message from [name], MD sent at 12/15/2015 10:35 PM EDT ----- With these results, kidney function appears to be worse, sugar control is worse. Lipids are way out of control -- need to confirm that he is actually taking his Lipitor  Glenetta Hew, MD  Pls forward to PCP: Joslyn Hy, NP

## 2016-01-08 ENCOUNTER — Ambulatory Visit: Payer: Medicaid Other

## 2016-01-08 ENCOUNTER — Other Ambulatory Visit: Payer: Self-pay | Admitting: Cardiology

## 2016-01-08 DIAGNOSIS — I5032 Chronic diastolic (congestive) heart failure: Secondary | ICD-10-CM

## 2016-01-08 DIAGNOSIS — I480 Paroxysmal atrial fibrillation: Secondary | ICD-10-CM

## 2016-01-08 DIAGNOSIS — I10 Essential (primary) hypertension: Secondary | ICD-10-CM

## 2016-01-09 ENCOUNTER — Ambulatory Visit: Payer: Medicaid Other | Admitting: Cardiology

## 2016-01-14 ENCOUNTER — Ambulatory Visit: Payer: Medicaid Other | Attending: Specialist

## 2016-01-14 DIAGNOSIS — G4733 Obstructive sleep apnea (adult) (pediatric): Secondary | ICD-10-CM | POA: Insufficient documentation

## 2016-01-14 DIAGNOSIS — I10 Essential (primary) hypertension: Secondary | ICD-10-CM | POA: Insufficient documentation

## 2016-01-14 DIAGNOSIS — R0683 Snoring: Secondary | ICD-10-CM | POA: Insufficient documentation

## 2016-01-14 DIAGNOSIS — R400 Somnolence: Secondary | ICD-10-CM | POA: Diagnosis not present

## 2016-01-14 DIAGNOSIS — E669 Obesity, unspecified: Secondary | ICD-10-CM | POA: Diagnosis not present

## 2016-01-22 ENCOUNTER — Ambulatory Visit (INDEPENDENT_AMBULATORY_CARE_PROVIDER_SITE_OTHER): Payer: Medicaid Other | Admitting: Cardiology

## 2016-01-22 ENCOUNTER — Encounter: Payer: Self-pay | Admitting: Cardiology

## 2016-01-22 VITALS — BP 173/133 | HR 67 | Ht 71.0 in | Wt 311.5 lb

## 2016-01-22 DIAGNOSIS — I428 Other cardiomyopathies: Secondary | ICD-10-CM

## 2016-01-22 DIAGNOSIS — I10 Essential (primary) hypertension: Secondary | ICD-10-CM

## 2016-01-22 DIAGNOSIS — I480 Paroxysmal atrial fibrillation: Secondary | ICD-10-CM | POA: Diagnosis not present

## 2016-01-22 DIAGNOSIS — R001 Bradycardia, unspecified: Secondary | ICD-10-CM

## 2016-01-22 DIAGNOSIS — E784 Other hyperlipidemia: Secondary | ICD-10-CM

## 2016-01-22 DIAGNOSIS — E6609 Other obesity due to excess calories: Secondary | ICD-10-CM

## 2016-01-22 DIAGNOSIS — I5032 Chronic diastolic (congestive) heart failure: Secondary | ICD-10-CM | POA: Diagnosis not present

## 2016-01-22 DIAGNOSIS — Z6841 Body Mass Index (BMI) 40.0 and over, adult: Secondary | ICD-10-CM

## 2016-01-22 DIAGNOSIS — E7849 Other hyperlipidemia: Secondary | ICD-10-CM

## 2016-01-22 DIAGNOSIS — IMO0001 Reserved for inherently not codable concepts without codable children: Secondary | ICD-10-CM

## 2016-01-22 MED ORDER — AMLODIPINE BESYLATE 10 MG PO TABS: 10.0000 mg | ORAL_TABLET | Freq: Every day | ORAL | 6 refills | Status: DC

## 2016-01-22 NOTE — Patient Instructions (Addendum)
Medication Instructions:  Your physician has recommended you make the following change in your medication:  1. INCREASE Amlodipine to 10 mg Once daily  Follow-Up: Your physician recommends that you schedule a follow-up appointment in: 3 months with Dr. Yvone Neu.  You have been referred to Endocrinology for abnormal metanephrines.   You have been referred to Renal physician for cyst and elevated BUN/Creat.   Your physician has requested that you regularly monitor and record your blood pressure readings at home. Please use the same machine at the same time of day to check your readings and record them to bring to your follow-up visit.  Call if Blood pressure is greater than 140/80  It was a pleasure seeing you today here in the office. Please do not hesitate to give [place] a call back if you have any further questions. [place], BSN

## 2016-01-22 NOTE — Addendum Note (Signed)
Addended by: Kittie Plater on: 01/22/2016 03:28 PM   Modules accepted: Orders

## 2016-01-22 NOTE — Progress Notes (Signed)
Cardiology Office Note   Date:  01/22/2016   ID:  Hector [name], DOB 26-Jul-1965, MRN [id number]  Referring Doctor:  [name], NP   Cardiologist:   Wende Bushy, MD   Reason for consultation:  Chief Complaint  Patient presents with  . other    renal and 51m fu. Pt states he had sleep study and is waiting on results. Pt states he is doing well. Reviewed meds with pt verbally.      History of Present Illness: Hector [name] is a 50 y.o. male who presents for Follow up. Patient used to see Dr. Alfonse Spruce is a 50 y.o. male with a PMH below who presents today for cardiology followup for her heart failure (combined systolic and diastolic) in the context of atrial flutter/fibrillation, Hypertension  Review of records show: He apparently has a significant history of alcoholic (nonischemic) cardiomyopathy dating back 2009 - with an echocardiogram showing an EF as low as 20%. Apparently this improved after he quit drinking alcohol. He was actually admitted to the hospital at [facility] August 14 2014 with atrial flutter with rapid response. He was cardioverted(TEE/DCCV), then reverted to atrial fibrillation and was started on amiodarone as well as Eliquis.  He saw Dr. [name] from electrophysiology in the clinic on August 21 2014 to determine further treatment options for atrial fibrillation/flutter as well as consider possibility of ICD referral.  Dr. [name] recommendation was to continue the amiodarone as he would not likely be a good candidate for ablative procedures to be discussed consider the possibility of cardiac catheterization to exclude an ischemic component of cardiomyopathy. And also agreed that he would be an appropriate candidate for ICD if indicated.  An echo December 2016 showed recovery of systolic function. Patient denies chest pain and shortness of breath. He struggles with continued blood pressure elevation. He has not been able to take the higher  dose of Entresto and has been relying on samples of the lower dose. His blood pressure has been noted to be elevated on the lower dose, better on the higher dose. Insurance is not coughing this medication according to the patient.  Since last visit, patient underwent sleep study. Results not available. He denies chest pain and shortness of breath. He did not call us sooner for elevated blood pressure. He says he continues to take his medications. Weight has gone up a few more pounds since last visit. Patient denies PND, orthopnea, edema. No angina, no palpitations, no headaches.   ROS:  Please see the history of present illness. Aside from mentioned under HPI, all other systems are reviewed and negative.     Past Medical History:  Diagnosis Date  . Alcohol abuse   . Alcoholic cardiomyopathy ([place]) 2009   a) Myoview: 11/'09: EF 28% with globally decreased function. No inducible ischemia, fixed inferoapical defect = Infarct vs.artifact (not commensurate with extent of reduced EF); b) Echo 8/'12: Mild LV dilation, Mod-Severe global hypokinesis - EF 25-35%. Mild-moderate RV dilation no significant bowel lesion mild-moderate MR; c) Echo Jan 2016: EF 20-25%, high LAP, mild-moderate LV dilation, severe L  . Atrial fibrillation ([place])    a. new onset s/p unsuccessful TEE/DCCV on 08/16/2014; b. on apixaban   . Atrial flutter ([place])    a. new onset 07/2014; b. s/p unsuccessful TEE/DCCV 08/16/2014; c. on apixaban   . CHF (congestive heart failure) ([place])   . Chronic combined systolic and diastolic heart failure, NYHA class 2 ([place])  a. see above  . CKD (chronic kidney disease), stage III   . Elevated troponin    Chronically elevated. - level was 0.17-0.10 during recent admission  . Essential hypertension   . GI bleed 11/2013  . Hyperlipidemia   . Myocardial infarct 2004  . Pancreatitis   . Sleep-disordered breathing    Has yet to have a sleep study    Past Surgical History:  Procedure Laterality  Date  . CARDIAC CATHETERIZATION N/A 01/09/2015   Procedure: Left Heart Cath and Coronary Angiography;  Surgeon: [name], MD;  Location: [place] [facility];  Service: Cardiovascular;  Laterality: N/A;  . ELECTROPHYSIOLOGIC STUDY N/A 08/16/2014   Procedure: CARDIOVERSION;  Surgeon: [name], MD;  Location: [facility] ORS;  Service: Cardiovascular;  Laterality: N/A;  . FLEXIBLE SIGMOIDOSCOPY N/A 10/10/2015   Procedure: FLEXIBLE SIGMOIDOSCOPY;  Surgeon: [name], MD;  Location: [facility] ENDOSCOPY;  Service: Endoscopy;  Laterality: N/A;  . KNEE SURGERY Right   . NM MYOVIEW LTD  November 2011   No ischemia or infarction. EF 50-55% ( no improvement from 2009 Myoview EF of 28%  . TEE WITHOUT CARDIOVERSION N/A 08/16/2014   Procedure: TRANSESOPHAGEAL ECHOCARDIOGRAM (TEE);  Surgeon: [name], MD;  Location: [facility] ORS;  Service: Cardiovascular;  Laterality: N/A;     reports that he quit smoking about 17 years ago. He has a 12.00 pack-year smoking history. He has never used smokeless tobacco. He reports that he does not drink alcohol or use drugs.   family history includes Diabetes in his mother; Hyperlipidemia in his mother; Hypertension in his mother.   Outpatient Medications Prior to Visit  Medication Sig Dispense Refill  . amiodarone (PACERONE) 200 MG tablet Take 1 tablet (200 mg total) by mouth daily. 60 tablet 3  . amLODipine (NORVASC) 5 MG tablet Take 1 tablet (5 mg total) by mouth daily. 30 tablet 6  . apixaban (ELIQUIS) 5 MG TABS tablet Take 1 tablet (5 mg total) by mouth 2 (two) times daily. 60 tablet 5  . atorvastatin (LIPITOR) 80 MG tablet Take 1 tablet (80 mg total) by mouth daily. 30 tablet 5  . carvedilol (COREG) 3.125 MG tablet Take 1 tablet (3.125 mg total) by mouth 2 (two) times daily with a meal. 60 tablet 1  . furosemide (LASIX) 40 MG tablet Take 1 tablet (40 mg total) by mouth daily. 30 tablet 5  . hydrocortisone-pramoxine (ANALPRAM HC) 2.5-1 % rectal cream  Place 1 application rectally 3 (three) times daily. 30 g 0  . levothyroxine (SYNTHROID) 50 MCG tablet Take 1 tablet (50 mcg total) by mouth daily before breakfast. 30 tablet 5  . methylcellulose (CITRUCEL) oral powder Take 1 packet by mouth daily as needed. 245 tablet 0  . omeprazole (PRILOSEC) 20 MG capsule Take 1 capsule (20 mg total) by mouth daily. 30 capsule 5  . valsartan (DIOVAN) 80 MG tablet Take 1 tablet (80 mg total) by mouth daily. 30 tablet 6   No facility-administered medications prior to visit.      Allergies: Eggs or egg-derived products    PHYSICAL EXAM: VS:  BP (!) 173/133 (BP Location: Left Arm, Patient Position: Sitting, Cuff Size: Normal)   Pulse 67   Ht 5\' 11"  (1.803 m)   Wt (!) 311 lb 8 oz (141.3 kg)   BMI 43.45 kg/m  , Body mass index is 43.45 kg/m. Wt Readings from Last 3 Encounters:  01/22/16 (!) 311 lb 8 oz (141.3 kg)  12/11/15 (!) 301  lb 6.4 oz (136.7 kg)  12/04/15 (!) 308 lb (139.7 kg)    GENERAL:  well developed, well nourished,Morbidly obese, not in acute distress HEENT: normocephalic, pink conjunctivae, anicteric sclerae, no xanthelasma, normal dentition, oropharynx clear NECK:  no neck vein engorgement, JVP normal, no hepatojugular reflux, carotid upstroke brisk and symmetric, no bruit, no thyromegaly, no lymphadenopathy LUNGS:  good respiratory effort, clear to auscultation bilaterally CV:  PMI not displaced, no thrills, no lifts, S1 and S2 within normal limits, no palpable S3 or S4, no murmurs, no rubs, no gallops ABD:  Soft, nontender, nondistended, normoactive bowel sounds, no abdominal aortic bruit, no hepatomegaly, no splenomegaly MS: nontender back, no kyphosis, no scoliosis, no joint deformities EXT:  2+ DP/PT pulses, no edema, no varicosities, no cyanosis, no clubbing SKIN: warm, nondiaphoretic, normal turgor, no ulcers NEUROPSYCH: alert, oriented to person, place, and time, sensory/motor grossly intact, normal mood, appropriate  affect  Recent Labs: 10/08/2015: ALT 24; TSH 47.715 12/12/2015: BUN 23; Creatinine, Ser 2.68; Hemoglobin 14.3; Platelets 202; Potassium 4.1; Sodium 138   Lipid Panel    Component Value Date/Time   CHOL 387 (H) 12/12/2015 0949   CHOL 216 (H) 05/22/2015 0905   CHOL 118 03/08/2014 0410   TRIG 218 (H) 12/12/2015 0949   TRIG 95 03/08/2014 0410   HDL 56 12/12/2015 0949   HDL 37 (L) 05/22/2015 0905   HDL 28 (L) 03/08/2014 0410   CHOLHDL 6.9 12/12/2015 0949   VLDL 44 (H) 12/12/2015 0949   VLDL 19 03/08/2014 0410   LDLCALC 287 (H) 12/12/2015 0949   LDLCALC 139 (H) 05/22/2015 0905   LDLCALC 71 03/08/2014 0410     Other studies Reviewed:  EKG:  The ekg from 12/11/2015 was personally reviewed by me and it revealed sinus bradycardia, 56 BPM. Incomplete left bundle branch block. QT is 480 ms, QTC 473 ms. Can change from EKG 10/08/2015.  Additional studies/ records that were reviewed personally reviewed by me today include:   2D Echo 1/13/'16:  ? Severely reduced EF of 20-25%. Elevated LAP, impaired LV relaxation (GR 1 DD) mild-moderate LV dilation with severe LA dilation. Mild-mild MR and TR.  TEE/DCCV 6/23/'16:      Procedures    Left Heart Cath and Coronary Angiography : 01/09/2015    Conclusion    1. Angiographically minimal CAD 2. Non-ishcemic Cardiomyopathy 3. Systemic Hypertension with Mild-Moderately increase LVEDP after pre-cath hydration. This confirms the presumed diagnosis of Non-ischemic Cardiomyopathy. Plan:  Discharge home after Bed-rest & TR band removal  Continue current Cardiomyopathy Medications - BB, Entresto & Lasix  Will refer back to Dr. Caryl Comes for reconsideration of ICD.   Echo 02/21/2015 - Challenging image quality. Rhythm is normal sinus. - Left ventricle: The cavity size was normal. There was mild concentric hypertrophy. Systolic function was normal. EF ~50% to 55%.  Unable to exclude inferior-posterior wall hypokinesis. Left ventricular  diastolic function parameters were normal. - Mitral valve: There was mild regurgitation. - Left atrium: The atrium was mildly dilated. - Right ventricle: Systolic function was normal. - Pulmonary arteries: Systolic pressure was within the normal  range.    ASSESSMENT AND PLAN:  Congestive heart failure, recovered systolic, Persistent diastolic dysfunction, Not in acute decompensation, chronic CHF Nonischemic cardiomyopathy based on negative CAD on left heart catheterization History of alcohol abuse leading to nonischemic cardiomyopathy Cardiomyopathy likely related to uncontrolled hypertension Hypertension, uncontrolled Recommend to increase amlodipine to 10 mg by mouth daily. Continue valsartan 80 mg daily.Patient should continue low-dose carvedilol 3.125 twice  a day. Continue Lasix 40 mg daily. Recommend close monitoring of blood pressure. Patient advised to obtain blood pressure monitor and write down blood pressure measurements to bring to him on his next follow-up. Blood pressure goal is less than 140/80. Patient to call our office. Lengthy discussion on lifestyle modification especially dietary changes, low sodium, heart healthy diet, increased physical activity will monitoring blood pressure. We will order an echocardiogram once blood pressure is better controlled. We will start workup for secondary causes of hypertension to include: Borderline fractionated plasma metanephrines, PA-C/PRA within normal limits ----we will refer to endocrine service renal artery ultrasound unable to rule out renal artery stenosis due to technical difficulty, renal cysts noted  -----refer back to renal service  Follow-up on sleep study results. Patient advised to follow-up with PCP for hypothyroidism.  Paroxysmal atrial flutter Paroxysmal atrial fibrillation In sinus rhythm QT acceptable. Continue amiodarone 200 mg once a day, and Eliquis 5 mg twice a day. Patient follows up with Dr. Caryl Comes as  well.  Hyperlipidemia Patient on statin therapy, PCP following labs.  Sinus bradycardia Patient is on lowest dose of carvedilol. Patient on amiodarone. No symptoms associated with the bradycardia. QTC is acceptable.  Morbid obesity Body mass index is 43.45 kg/m.Marland Kitchen Recommend aggressive weight loss through diet and increased physical activity. It has gone up further. Advised to try to lose at least 1 pound a week.    Current medicines are reviewed at length with the patient today.  The patient does not have concerns regarding medicines.  Labs/ tests ordered today include:  No orders of the defined types were placed in this encounter.   I had a lengthy and detailed discussion with the patient regarding diagnoses, prognosis, diagnostic options, treatment options , and side effects of medications.   I counseled the patient on importance of lifestyle modification including heart healthy diet, regular physical activity .   Disposition:   FU with undersigned in 2-3 months or sooner  Patient has multiple cardiac diagnosis and medical comorbidities, requiring high level medical decision making.  Signed, Wende Bushy, MD  01/22/2016 11:08 AM    [place]  This note was generated in part with voice recognition software and I apologize for any typographical errors that were not detected and corrected.

## 2016-04-20 ENCOUNTER — Encounter: Payer: Self-pay | Admitting: Family

## 2016-04-20 ENCOUNTER — Ambulatory Visit: Payer: Medicaid Other | Attending: Family | Admitting: Family

## 2016-04-20 VITALS — BP 193/129 | HR 64 | Resp 18 | Ht 71.0 in | Wt 310.4 lb

## 2016-04-20 DIAGNOSIS — I10 Essential (primary) hypertension: Secondary | ICD-10-CM

## 2016-04-20 DIAGNOSIS — Z7901 Long term (current) use of anticoagulants: Secondary | ICD-10-CM | POA: Insufficient documentation

## 2016-04-20 DIAGNOSIS — I130 Hypertensive heart and chronic kidney disease with heart failure and stage 1 through stage 4 chronic kidney disease, or unspecified chronic kidney disease: Secondary | ICD-10-CM | POA: Insufficient documentation

## 2016-04-20 DIAGNOSIS — I4891 Unspecified atrial fibrillation: Secondary | ICD-10-CM | POA: Diagnosis not present

## 2016-04-20 DIAGNOSIS — Z79899 Other long term (current) drug therapy: Secondary | ICD-10-CM | POA: Insufficient documentation

## 2016-04-20 DIAGNOSIS — G4733 Obstructive sleep apnea (adult) (pediatric): Secondary | ICD-10-CM | POA: Insufficient documentation

## 2016-04-20 DIAGNOSIS — E785 Hyperlipidemia, unspecified: Secondary | ICD-10-CM | POA: Diagnosis not present

## 2016-04-20 DIAGNOSIS — Z87891 Personal history of nicotine dependence: Secondary | ICD-10-CM | POA: Insufficient documentation

## 2016-04-20 DIAGNOSIS — N183 Chronic kidney disease, stage 3 (moderate): Secondary | ICD-10-CM | POA: Insufficient documentation

## 2016-04-20 DIAGNOSIS — I5042 Chronic combined systolic (congestive) and diastolic (congestive) heart failure: Secondary | ICD-10-CM | POA: Insufficient documentation

## 2016-04-20 DIAGNOSIS — I480 Paroxysmal atrial fibrillation: Secondary | ICD-10-CM

## 2016-04-20 DIAGNOSIS — I426 Alcoholic cardiomyopathy: Secondary | ICD-10-CM | POA: Insufficient documentation

## 2016-04-20 DIAGNOSIS — I252 Old myocardial infarction: Secondary | ICD-10-CM | POA: Insufficient documentation

## 2016-04-20 DIAGNOSIS — I5032 Chronic diastolic (congestive) heart failure: Secondary | ICD-10-CM

## 2016-04-20 NOTE — Progress Notes (Signed)
Patient ID: NILESH STEGALL, male    DOB: [birth year], 51 y.o.   MRN: [id number]  HPI  Mr Hardwick is a 51 y/o male with a history of MI, hyperlipidemia, HTN, CKD, atrial fibrillation, alcohol use and cardiomyopathy.  Last echo was done 02/21/15 which showed a normalization of his EF which is now 50-55% with mild MR. No longer ICD candidate. Prior echo on 03/07/14 showed an EF of 20-25% with mild/mod MR. He was started on [facility] Feb 2016. Last left heart cath was done 01/09/15.   Was last admitted on 10/08/15 with a GI bleed. GI was consulted and a flex sigmoidoscopy was done which showed hemorrhoids.   He presents today for a follow-up visit with shortness of breath upon moderate exertion. Denies any swelling in his legs/abdomen. Has been weighing consistently and says that his weight has been stable.  He hasn't taken any of his medications yet today but does report that there's been changes. Does not have a blood pressure cuff at home. Reports feeling better since wearing his CPAP.  Past Medical History:  Diagnosis Date  . Alcohol abuse   . Alcoholic cardiomyopathy ([place]) 2009   a) Myoview: 11/'09: EF 28% with globally decreased function. No inducible ischemia, fixed inferoapical defect = Infarct vs.artifact (not commensurate with extent of reduced EF); b) Echo 8/'12: Mild LV dilation, Mod-Severe global hypokinesis - EF 25-35%. Mild-moderate RV dilation no significant bowel lesion mild-moderate MR; c) Echo Jan 2016: EF 20-25%, high LAP, mild-moderate LV dilation, severe L  . Atrial fibrillation ([place])    a. new onset s/p unsuccessful TEE/DCCV on 08/16/2014; b. on apixaban   . Atrial flutter ([place])    a. new onset 07/2014; b. s/p unsuccessful TEE/DCCV 08/16/2014; c. on apixaban   . CHF (congestive heart failure) ([place])   . Chronic combined systolic and diastolic heart failure, NYHA class 2 ([place])    a. see above  . CKD (chronic kidney disease), stage III   . Elevated troponin    Chronically  elevated. - level was 0.17-0.10 during recent admission  . Essential hypertension   . GI bleed 11/2013  . Hyperlipidemia   . Myocardial infarct 2004  . Pancreatitis   . Sleep-disordered breathing    Has yet to have a sleep study   Past Surgical History:  Procedure Laterality Date  . CARDIAC CATHETERIZATION N/A 01/09/2015   Procedure: Left Heart Cath and Coronary Angiography;  Surgeon: [name], MD;  Location: [place] [facility] LAB;  Service: Cardiovascular;  Laterality: N/A;  . ELECTROPHYSIOLOGIC STUDY N/A 08/16/2014   Procedure: CARDIOVERSION;  Surgeon: [name], MD;  Location: [facility] ORS;  Service: Cardiovascular;  Laterality: N/A;  . FLEXIBLE SIGMOIDOSCOPY N/A 10/10/2015   Procedure: FLEXIBLE SIGMOIDOSCOPY;  Surgeon: [name], MD;  Location: [facility] ENDOSCOPY;  Service: Endoscopy;  Laterality: N/A;  . KNEE SURGERY Right   . NM MYOVIEW LTD  November 2011   No ischemia or infarction. EF 50-55% ( no improvement from 2009 Myoview EF of 28%  . TEE WITHOUT CARDIOVERSION N/A 08/16/2014   Procedure: TRANSESOPHAGEAL ECHOCARDIOGRAM (TEE);  Surgeon: [name], MD;  Location: [facility] ORS;  Service: Cardiovascular;  Laterality: N/A;   Family History  Problem Relation Age of Onset  . Hypertension Mother   . Hyperlipidemia Mother   . Diabetes Mother    Social History  Substance Use Topics  . Smoking status: Former Smoker    Packs/day: 1.00    Years: 12.00    Quit  date: 02/23/1998  . Smokeless tobacco: Never Used  . Alcohol use No     Comment: 4 heavy alcohol drinker. Quit 2 years ago.   Allergies  Allergen Reactions  . Eggs Or Egg-Derived Products Rash   Prior to Admission medications   Medication Sig Start Date End Date Taking? Authorizing Provider  amiodarone (PACERONE) 200 MG tablet Take 1 tablet (200 mg total) by mouth daily. 05/22/15  Yes [name], MD  amLODipine (NORVASC) 10 MG tablet Take 1 tablet (10 mg total) by mouth daily. 01/22/16 04/21/16 Yes  Wende Bushy, MD  apixaban (ELIQUIS) 5 MG TABS tablet Take 1 tablet (5 mg total) by mouth 2 (two) times daily. 09/12/14  Yes Alisa Graff, FNP  atorvastatin (LIPITOR) 80 MG tablet Take 1 tablet (80 mg total) by mouth daily. 12/04/15  Yes Alisa Graff, FNP  carvedilol (COREG) 3.125 MG tablet Take 1 tablet (3.125 mg total) by mouth 2 (two) times daily with a meal. 10/10/15  Yes Henreitta Leber, MD  furosemide (LASIX) 40 MG tablet Take 1 tablet (40 mg total) by mouth daily. 05/22/15  Yes [name], MD  hydrocortisone-pramoxine [facility]) 2.5-1 % rectal cream Place 1 application rectally 3 (three) times daily. 10/10/15  Yes Henreitta Leber, MD  levothyroxine (SYNTHROID) 50 MCG tablet Take 1 tablet (50 mcg total) by mouth daily before breakfast. 06/18/15  Yes [name], MD  methylcellulose (CITRUCEL) oral powder Take 1 packet by mouth daily as needed. 10/10/15  Yes Henreitta Leber, MD  omeprazole (PRILOSEC) 20 MG capsule Take 1 capsule (20 mg total) by mouth daily. 05/22/15  Yes [name], MD  valsartan (DIOVAN) 80 MG tablet Take 1 tablet (80 mg total) by mouth daily. 12/11/15  Yes Wende Bushy, MD    Review of Systems  Constitutional: Negative for appetite change and fatigue.  HENT: Negative for congestion, postnasal drip and sore throat.   Eyes: Negative.   Respiratory: Positive for shortness of breath. Negative for chest tightness and wheezing.   Cardiovascular: Negative for chest pain, palpitations and leg swelling.  Gastrointestinal: Negative for abdominal distention and abdominal pain.  Endocrine: Negative.   Genitourinary: Negative.   Musculoskeletal: Negative for back pain and neck pain.  Skin: Negative.   Allergic/Immunologic: Negative.   Neurological: Negative for dizziness and light-headedness.  Hematological: Negative for adenopathy. Does not bruise/bleed easily.  Psychiatric/Behavioral: Negative for dysphoric mood and sleep disturbance (wearing CPAP nightly). The  patient is not nervous/anxious.    Vitals:   04/20/16 1058  BP: (!) 193/129  Pulse: 64  Resp: 18  SpO2: 97%  Weight: (!) 310 lb 6 oz (140.8 kg)  Height: 5\' 11"  (1.803 m)   Wt Readings from Last 3 Encounters:  04/20/16 (!) 310 lb 6 oz (140.8 kg)  01/22/16 (!) 311 lb 8 oz (141.3 kg)  12/11/15 (!) 301 lb 6.4 oz (136.7 kg)   Lab Results  Component Value Date   CREATININE 2.68 (H) 12/12/2015   CREATININE 2.41 (H) 10/10/2015   CREATININE 2.61 (H) 10/08/2015   Physical Exam  Constitutional: He is oriented to person, place, and time. He appears well-developed and well-nourished.  HENT:  Head: Normocephalic and atraumatic.  Eyes: Conjunctivae are normal. Pupils are equal, round, and reactive to light.  Neck: Normal range of motion. Neck supple. No JVD present.  Cardiovascular: Normal rate and regular rhythm.   Pulmonary/Chest: Effort normal. He has no wheezes. He has no rales.  Abdominal: Soft. He  exhibits no distension. There is no tenderness.  Musculoskeletal: He exhibits no edema or tenderness.  Neurological: He is alert and oriented to person, place, and time.  Skin: Skin is warm and dry.  Psychiatric: He has a normal mood and affect. His behavior is normal. Thought content normal.  Nursing note and vitals reviewed.     Assessment & Plan:  1: Chronic heart failure with preserved ejection fraction- - NYHA Class II - euvolemic today - weight up 2 pounds from last time here. Reminded to call for an overnight weight gain of >2 pounds or a weekly weight gain of >5 pounds - entresto was no longer covered due to normalization of EF so cardiologist started him on valsartan - saw cardiologist Yvone Neu) on 01/22/16 and returns to her 04/28/16 - he admits to eating late and then going straight to bed - does walk 1/4 mile to work one way and says that he and his brother are getting ready to join the [employer]  2: HTN- - BP elevated even after recheck with manual cuff - has not taken any of  his medications yet today - does not have a blood pressure cuff at home and he was encouraged to get one so that he can monitor his BP at home. - discussed that just because he doesn't feel bad, doesn't mean his BP is ok  3: Atrial fibrillation- - currently rate controlled  - taking amiodarone, apixaban and carvedilol  4: Obstructive sleep apnea- - patient says that he feels "much better" since wearing his CPAP since about the 2nd week of January 2018 - reports sleeping better without snoring - wears it 4-5 hours nightly - does say that it dries his mouth out. Instructed him to get a humidifier in his room and see if that helps, otherwise, he may need to contact sleep med to see if a humidifier can be added  Patient did not bring his medications nor a list. Each medication was verbally reviewed with the patient and he was encouraged to bring the bottles to every visit to confirm accuracy of list.  Return here in 3 months or sooner for any questions/problems before then.

## 2016-04-20 NOTE — Patient Instructions (Signed)
Continue weighing daily and call for an overnight weight gain of > 2 pounds or a weekly weight gain of >5 pounds. 

## 2016-04-21 DIAGNOSIS — G4733 Obstructive sleep apnea (adult) (pediatric): Secondary | ICD-10-CM | POA: Insufficient documentation

## 2016-04-23 ENCOUNTER — Ambulatory Visit: Payer: Medicaid Other | Admitting: Cardiology

## 2016-04-28 ENCOUNTER — Encounter: Payer: Self-pay | Admitting: Cardiology

## 2016-04-28 ENCOUNTER — Ambulatory Visit (INDEPENDENT_AMBULATORY_CARE_PROVIDER_SITE_OTHER): Payer: Medicaid Other | Admitting: Cardiology

## 2016-04-28 VITALS — BP 150/90 | HR 54 | Ht 71.0 in | Wt 314.8 lb

## 2016-04-28 DIAGNOSIS — I10 Essential (primary) hypertension: Secondary | ICD-10-CM

## 2016-04-28 DIAGNOSIS — E784 Other hyperlipidemia: Secondary | ICD-10-CM

## 2016-04-28 DIAGNOSIS — E6609 Other obesity due to excess calories: Secondary | ICD-10-CM

## 2016-04-28 DIAGNOSIS — R001 Bradycardia, unspecified: Secondary | ICD-10-CM | POA: Diagnosis not present

## 2016-04-28 DIAGNOSIS — I480 Paroxysmal atrial fibrillation: Secondary | ICD-10-CM

## 2016-04-28 DIAGNOSIS — Z6841 Body Mass Index (BMI) 40.0 and over, adult: Secondary | ICD-10-CM

## 2016-04-28 DIAGNOSIS — E7849 Other hyperlipidemia: Secondary | ICD-10-CM

## 2016-04-28 DIAGNOSIS — I5032 Chronic diastolic (congestive) heart failure: Secondary | ICD-10-CM

## 2016-04-28 DIAGNOSIS — IMO0001 Reserved for inherently not codable concepts without codable children: Secondary | ICD-10-CM

## 2016-04-28 NOTE — Progress Notes (Signed)
Cardiology Office Note   Date:  04/28/2016   ID:  Hector [name], DOB 1965-09-02, MRN [id number]  Referring Doctor:  [name], NP   Cardiologist:   Wende Bushy, MD   Reason for consultation:  Chief Complaint  Patient presents with  . other    3 mo f/u Pt stated he has been using a CPAP machine [facility] prescribed) for the last month and a half. Meds reviewed verbally with patient.      History of Present Illness: Hector [name] is a 51 y.o. male who presents for Follow up. Patient used to see Dr. Alfonse Spruce is a 51 y.o. male with a PMH below who presents today for cardiology followup for her heart failure (combined systolic and diastolic) in the context of atrial flutter/fibrillation, Hypertension  Review of records show: He apparently has a significant history of alcoholic (nonischemic) cardiomyopathy dating back 2009 - with an echocardiogram showing an EF as low as 20%. Apparently this improved after he quit drinking alcohol. He was actually admitted to the hospital at [facility] August 14 2014 with atrial flutter with rapid response. He was cardioverted(TEE/DCCV), then reverted to atrial fibrillation and was started on amiodarone as well as Eliquis.  He saw Dr. [name] from electrophysiology in the clinic on August 21 2014 to determine further treatment options for atrial fibrillation/flutter as well as consider possibility of ICD referral.  Dr. [name] recommendation was to continue the amiodarone as he would not likely be a good candidate for ablative procedures to be discussed consider the possibility of cardiac catheterization to exclude an ischemic component of cardiomyopathy. And also agreed that he would be an appropriate candidate for ICD if indicated.  An echo December 2016 showed recovery of systolic function. Patient denies chest pain and shortness of breath. He struggles with continued blood pressure elevation. He has not been able to take  the higher dose of Entresto and has been relying on samples of the lower dose. His blood pressure has been noted to be elevated on the lower dose, better on the higher dose. Insurance is not coughing this medication according to the patient.  Since last visit, patient has been doing okay. Has noticed some improvement in his blood pressure. He has started using his CPAP machine.  No chest pains, shortness of breath, palpitations.   ROS:  Please see the history of present illness. Aside from mentioned under HPI, all other systems are reviewed and negative.     Past Medical History:  Diagnosis Date  . Alcohol abuse   . 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 No inducible ischemia, fixed inferoapical defect = Infarct vs.artifact (not commensurate with extent of reduced EF); b) Echo 8/'12: Mild LV dilation, Mod-Severe global hypokinesis - EF 25-35%. Mild-moderate RV dilation no significant bowel lesion mild-moderate MR; c) Echo Jan 2016: EF 20-25%, high LAP, mild-moderate LV dilation, severe L  . Atrial fibrillation ([place])    a. new onset s/p unsuccessful TEE/DCCV on 08/16/2014; b. on apixaban   . Atrial flutter ([place])    a. new onset 07/2014; b. s/p unsuccessful TEE/DCCV 08/16/2014; c. on apixaban   . CHF (congestive heart failure) ([place])   . Chronic combined systolic and diastolic heart failure, NYHA class 2 ([place])    a. see above  . CKD (chronic kidney disease), stage III   . Elevated troponin    Chronically elevated. - level was 0.17-0.10  during recent admission  . Essential hypertension   . GI bleed 11/2013  . Hyperlipidemia   . Myocardial infarct 2004  . Pancreatitis   . Sleep-disordered breathing    Has yet to have a sleep study    Past Surgical History:  Procedure Laterality Date  . CARDIAC CATHETERIZATION N/A 01/09/2015   Procedure: Left Heart Cath and Coronary Angiography;  Surgeon: [name], MD;  Location: [place] CV LAB;  Service: Cardiovascular;  Laterality: N/A;  . ELECTROPHYSIOLOGIC STUDY N/A 08/16/2014   Procedure: CARDIOVERSION;  Surgeon: [name], MD;  Location: [facility] ORS;  Service: Cardiovascular;  Laterality: N/A;  . FLEXIBLE SIGMOIDOSCOPY N/A 10/10/2015   Procedure: FLEXIBLE SIGMOIDOSCOPY;  Surgeon: [name], MD;  Location: [facility] ENDOSCOPY;  Service: Endoscopy;  Laterality: N/A;  . KNEE SURGERY Right   . NM MYOVIEW LTD  November 2011   No ischemia or infarction. EF 50-55% ( no improvement from 2009 Myoview EF of 28%  . TEE WITHOUT CARDIOVERSION N/A 08/16/2014   Procedure: TRANSESOPHAGEAL ECHOCARDIOGRAM (TEE);  Surgeon: [name], MD;  Location: [facility] ORS;  Service: Cardiovascular;  Laterality: N/A;     reports that he quit smoking about 18 years ago. He has a 12.00 pack-year smoking history. He has never used smokeless tobacco. He reports that he does not drink alcohol or use drugs.   family history includes Diabetes in his mother; Hyperlipidemia in his mother; Hypertension in his mother.   Outpatient Medications Prior to Visit  Medication Sig Dispense Refill  . amiodarone (PACERONE) 200 MG tablet Take 1 tablet (200 mg total) by mouth daily. 60 tablet 3  . amLODipine (NORVASC) 10 MG tablet Take 1 tablet (10 mg total) by mouth daily. 30 tablet 6  . apixaban (ELIQUIS) 5 MG TABS tablet Take 1 tablet (5 mg total) by mouth 2 (two) times daily. 60 tablet 5  . atorvastatin (LIPITOR) 80 MG tablet Take 1 tablet (80 mg total) by mouth daily. 30 tablet 5  . carvedilol (COREG) 3.125 MG tablet Take 1 tablet (3.125 mg total) by mouth 2 (two) times daily with a meal. 60 tablet 1  . furosemide (LASIX) 40 MG tablet Take 1 tablet (40 mg total) by mouth daily. 30 tablet 5  . hydrocortisone-pramoxine (ANALPRAM HC) 2.5-1 % rectal cream Place 1 application rectally 3 (three) times daily. 30 g 0  . levothyroxine (SYNTHROID) 50 MCG tablet Take 1 tablet (50 mcg total) by mouth daily before  breakfast. 30 tablet 5  . methylcellulose (CITRUCEL) oral powder Take 1 packet by mouth daily as needed. 245 tablet 0  . omeprazole (PRILOSEC) 20 MG capsule Take 1 capsule (20 mg total) by mouth daily. 30 capsule 5  . valsartan (DIOVAN) 80 MG tablet Take 1 tablet (80 mg total) by mouth daily. 30 tablet 6   No facility-administered medications prior to visit.      Allergies: Eggs or egg-derived products    PHYSICAL EXAM: VS:  BP (!) 150/90 (BP Location: Left Arm, Patient Position: Sitting, Cuff Size: Large)   Pulse (!) 54   Ht 5\' 11"  (1.803 m)   Wt (!) 314 lb 12 oz (142.8 kg)   BMI 43.90 kg/m  , Body mass index is 43.9 kg/m. Wt Readings from Last 3 Encounters:  04/28/16 (!) 314 lb 12 oz (142.8 kg)  04/20/16 (!) 310 lb 6 oz (140.8 kg)  01/22/16 (!) 311 lb 8 oz (141.3 kg)  GENERAL:  well developed, well nourished, obese, not in  acute distress HEENT: normocephalic, pink conjunctivae, anicteric sclerae, no xanthelasma, normal dentition, oropharynx clear NECK:  no neck vein engorgement, JVP normal, no hepatojugular reflux, carotid upstroke brisk and symmetric, no bruit, no thyromegaly, no lymphadenopathy LUNGS:  good respiratory effort, clear to auscultation bilaterally CV:  PMI not displaced, no thrills, no lifts, S1 and S2 within normal limits, no palpable S3 or S4, no murmurs, no rubs, no gallops ABD:  Soft, nontender, nondistended, normoactive bowel sounds, no abdominal aortic bruit, no hepatomegaly, no splenomegaly MS: nontender back, no kyphosis, no scoliosis, no joint deformities EXT:  2+ DP/PT pulses, no edema, no varicosities, no cyanosis, no clubbing SKIN: warm, nondiaphoretic, normal turgor, no ulcers NEUROPSYCH: alert, oriented to person, place, and time, sensory/motor grossly intact, normal mood, appropriate affect    Recent Labs: 10/08/2015: ALT 24; TSH 47.715 12/12/2015: BUN 23; Creatinine, Ser 2.68; Hemoglobin 14.3; Platelets 202; Potassium 4.1; Sodium 138   Lipid  Panel    Component Value Date/Time   CHOL 387 (H) 12/12/2015 0949   CHOL 216 (H) 05/22/2015 0905   CHOL 118 03/08/2014 0410   TRIG 218 (H) 12/12/2015 0949   TRIG 95 03/08/2014 0410   HDL 56 12/12/2015 0949   HDL 37 (L) 05/22/2015 0905   HDL 28 (L) 03/08/2014 0410   CHOLHDL 6.9 12/12/2015 0949   VLDL 44 (H) 12/12/2015 0949   VLDL 19 03/08/2014 0410   LDLCALC 287 (H) 12/12/2015 0949   LDLCALC 139 (H) 05/22/2015 0905   LDLCALC 71 03/08/2014 0410     Other studies Reviewed:  EKG:  The ekg from 12/11/2015 was personally reviewed by me and it revealed sinus bradycardia, 56 BPM. Incomplete left bundle branch block. QT is 480 ms, QTC 473 ms. Can change from EKG 10/08/2015.  Additional studies/ records that were reviewed personally reviewed by me today include:   2D Echo 1/13/'16:  ? Severely reduced EF of 20-25%. Elevated LAP, impaired LV relaxation (GR 1 DD) mild-moderate LV dilation with severe LA dilation. Mild-mild MR and TR.  TEE/DCCV 6/23/'16:      Procedures    Left Heart Cath and Coronary Angiography : 01/09/2015    Conclusion    1. Angiographically minimal CAD 2. Non-ishcemic Cardiomyopathy 3. Systemic Hypertension with Mild-Moderately increase LVEDP after pre-cath hydration. This confirms the presumed diagnosis of Non-ischemic Cardiomyopathy. Plan:  Discharge home after Bed-rest & TR band removal  Continue current Cardiomyopathy Medications - BB, Entresto & Lasix  Will refer back to Dr. Caryl Comes for reconsideration of ICD.   Echo 02/21/2015 - Challenging image quality. Rhythm is normal sinus. - Left ventricle: The cavity size was normal. There was mild concentric hypertrophy. Systolic function was normal. EF ~50% to 55%.  Unable to exclude inferior-posterior wall hypokinesis. Left ventricular diastolic function parameters were normal. - Mitral valve: There was mild regurgitation. - Left atrium: The atrium was mildly dilated. - Right ventricle: Systolic  function was normal. - Pulmonary arteries: Systolic pressure was within the normal  range.    ASSESSMENT AND PLAN:  Congestive heart failure, recovered systolic, Persistent diastolic dysfunction, Not in acute decompensation, chronic CHF Nonischemic cardiomyopathy based on negative CAD on left heart catheterization History of alcohol abuse leading to nonischemic cardiomyopathy Cardiomyopathy likely related to uncontrolled hypertension Hypertension, improving Continue amlodipine 10, starting an 80, carvedilol 3.125 twice a day. Continue Lasix 40 daily.  Tendon blood pressure monitoring. Blood pressure goal less than 140/80, patient to call our office if measurements greater than that. Recommend heart healthy lifestyle with low sodium  diet. Weight loss recommended.  Recommend to check echo now. We will start workup for secondary causes of hypertension to include: Borderline fractionated plasma metanephrines, PA-C/PRA within normal limits ----we will refer to endocrine service, , unsure if patient followed up renal artery ultrasound unable to rule out renal artery stenosis due to technical difficulty, renal cysts noted  -----refer back to renal service  Continue with CPAP Patient advised to follow-up with PCP for hypothyroidism.  Paroxysmal atrial flutter Paroxysmal atrial fibrillation Remains in sinus rhythm. QT 480 ms, QTC 470 ms from today. Continue amiodarone 200 mg daily, and eliquis family as twice a day.  Patient follows up with Dr. Caryl Comes as well.  Hyperlipidemia PCP following labs.  Sinus bradycardia Recommendation the same as from last visit 03/24/2015: Patient is on lowest dose of carvedilol. Patient on amiodarone. No symptoms associated with the bradycardia. QTC is acceptable.  Morbid obesity Body mass index is 43.9 kg/m.Marland Kitchen Recommend aggressive weight loss through diet and increased physical activity.    Current medicines are reviewed at length with the patient today.   The patient does not have concerns regarding medicines.  Labs/ tests ordered today include:  Orders Placed This Encounter  Procedures  . EKG 12-Lead  . ECHOCARDIOGRAM COMPLETE    I had a lengthy and detailed discussion with the patient regarding diagnoses, prognosis, diagnostic options, treatment options , and side effects of medications.   I counseled the patient on importance of lifestyle modification including heart healthy diet, regular physical activity .   Disposition:   FU with undersigned in 3 months or sooner  Patient has multiple cardiac diagnosis and medical comorbidities, requiring high level medical decision making.  Signed, Wende Bushy, MD  04/28/2016 3:47 PM    [place]  This note was generated in part with voice recognition software and I apologize for any typographical errors that were not detected and corrected.

## 2016-04-28 NOTE — Patient Instructions (Signed)
Testing/Procedures: Your physician has requested that you have an echocardiogram. Echocardiography is a painless test that uses sound waves to create images of your heart. It provides your doctor with information about the size and shape of your heart and how well your heart's chambers and valves are working. This procedure takes approximately one hour. There are no restrictions for this procedure.    Follow-Up: Your physician recommends that you schedule a follow-up appointment in: 3 months with Dr. Ingal.  It was a pleasure seeing you today here in the office. Please do not hesitate to give us a call back if you have any further questions. [phone]  Anita Mcadory A. RN, BSN      

## 2016-05-27 ENCOUNTER — Other Ambulatory Visit: Payer: Medicaid Other

## 2016-06-02 ENCOUNTER — Other Ambulatory Visit: Payer: Self-pay

## 2016-06-02 ENCOUNTER — Ambulatory Visit (INDEPENDENT_AMBULATORY_CARE_PROVIDER_SITE_OTHER): Payer: Medicaid Other

## 2016-06-02 DIAGNOSIS — I5032 Chronic diastolic (congestive) heart failure: Secondary | ICD-10-CM | POA: Diagnosis not present

## 2016-06-09 ENCOUNTER — Ambulatory Visit: Payer: Medicaid Other | Admitting: Cardiology

## 2016-06-16 ENCOUNTER — Ambulatory Visit: Payer: Medicaid Other | Admitting: Cardiology

## 2016-06-18 ENCOUNTER — Encounter: Payer: Self-pay | Admitting: *Deleted

## 2016-06-18 ENCOUNTER — Emergency Department
Admission: EM | Admit: 2016-06-18 | Discharge: 2016-06-18 | Disposition: A | Discharge status: Home / Self Care | Payer: Medicaid Other | Attending: Emergency Medicine | Admitting: Emergency Medicine

## 2016-06-18 ENCOUNTER — Emergency Department: Payer: Medicaid Other

## 2016-06-18 DIAGNOSIS — I130 Hypertensive heart and chronic kidney disease with heart failure and stage 1 through stage 4 chronic kidney disease, or unspecified chronic kidney disease: Secondary | ICD-10-CM | POA: Insufficient documentation

## 2016-06-18 DIAGNOSIS — Z79899 Other long term (current) drug therapy: Secondary | ICD-10-CM | POA: Diagnosis not present

## 2016-06-18 DIAGNOSIS — Z87891 Personal history of nicotine dependence: Secondary | ICD-10-CM | POA: Diagnosis not present

## 2016-06-18 DIAGNOSIS — I509 Heart failure, unspecified: Secondary | ICD-10-CM | POA: Insufficient documentation

## 2016-06-18 DIAGNOSIS — M25562 Pain in left knee: Secondary | ICD-10-CM | POA: Diagnosis present

## 2016-06-18 DIAGNOSIS — M25462 Effusion, left knee: Secondary | ICD-10-CM | POA: Insufficient documentation

## 2016-06-18 DIAGNOSIS — N183 Chronic kidney disease, stage 3 (moderate): Secondary | ICD-10-CM | POA: Diagnosis not present

## 2016-06-18 IMAGING — DX DG KNEE COMPLETE 4+V*L*
4 series · 4 of 4 positions shown · non-contrast
Comparison: Prior radiograph from [DATE].

CLINICAL DATA: Initial evaluation for acute pain and swelling. No
injury.

EXAM:
LEFT KNEE - COMPLETE 4+ VIEW

[knee ap]
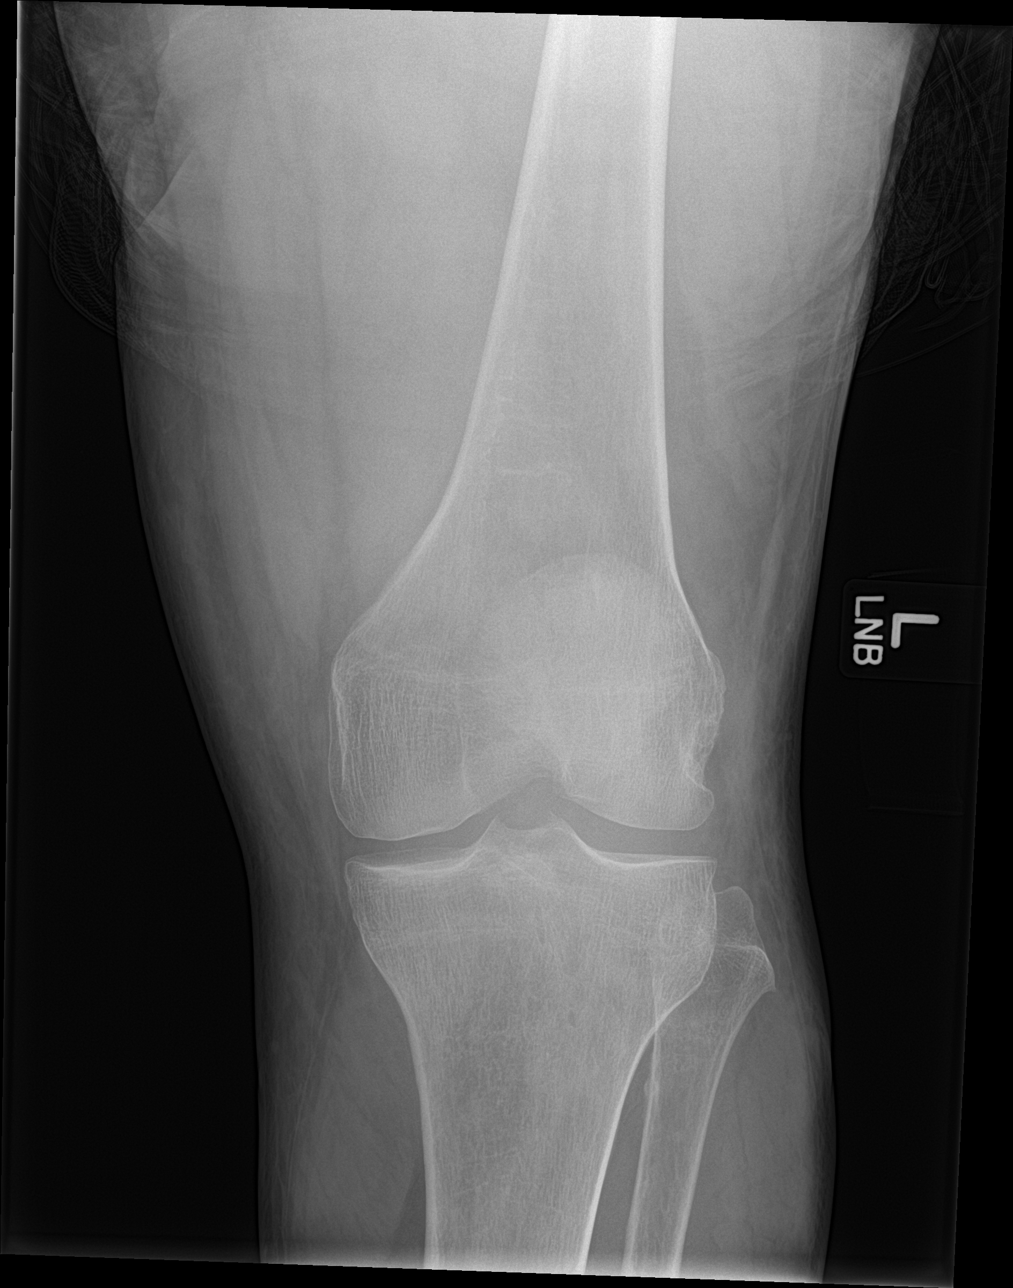

[knee lat]
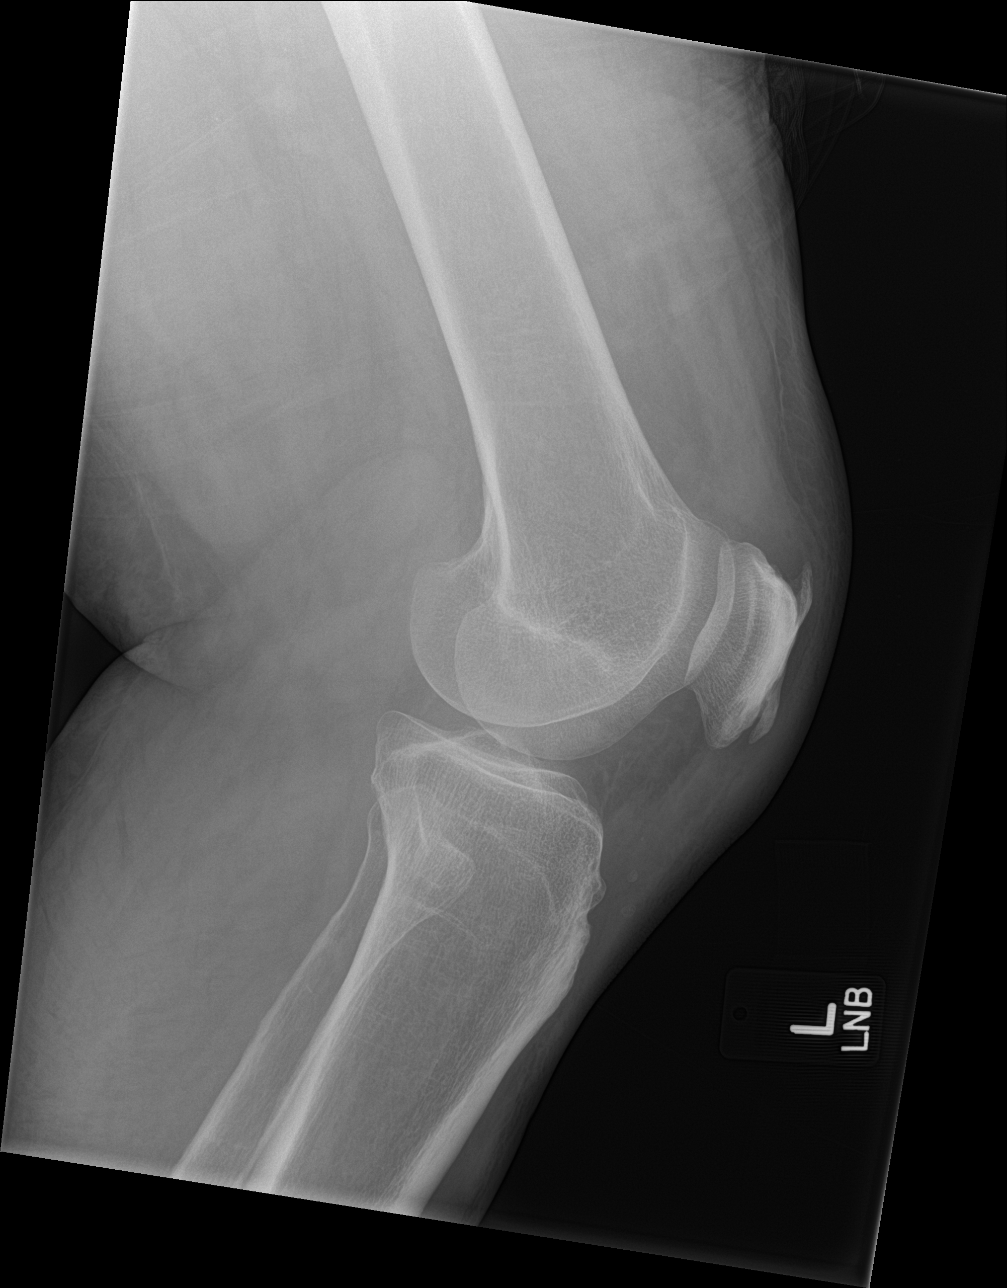

[knee obl (1 of 2)]
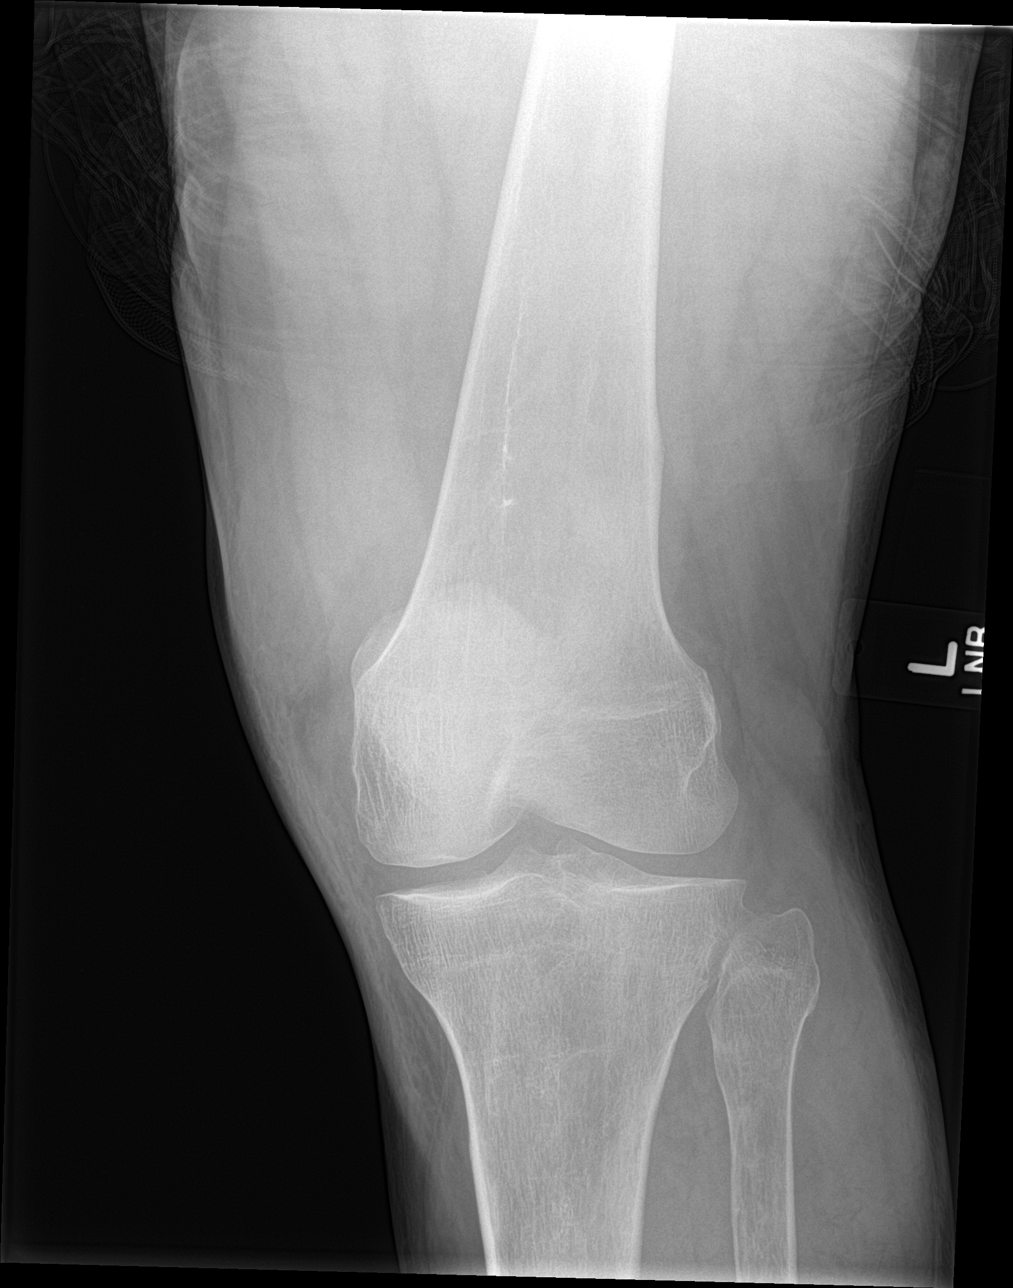

[knee obl (2 of 2)]
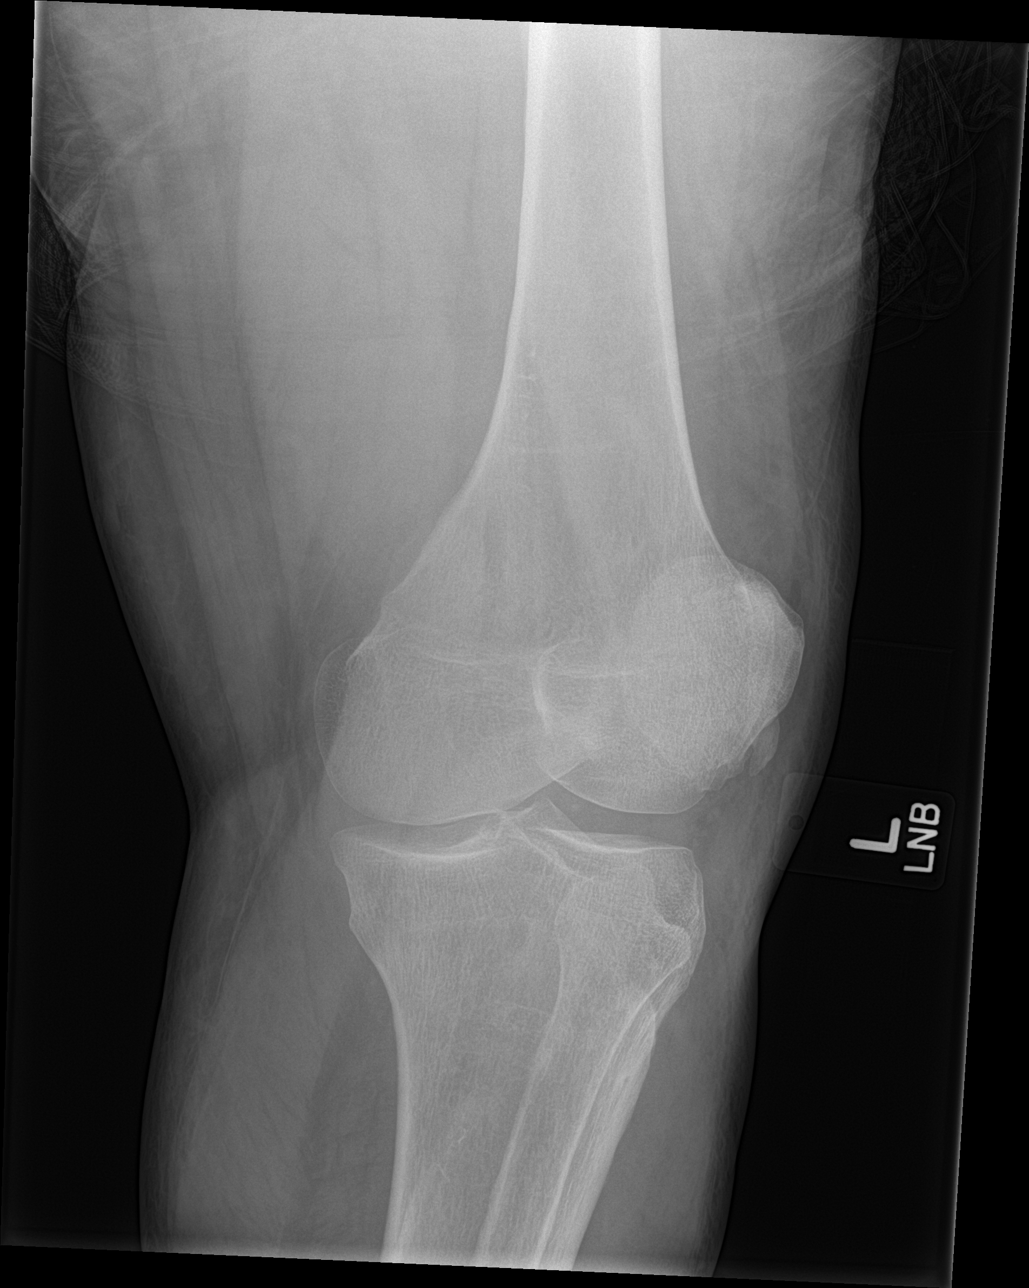

[4 of 4 positions shown; findings below may reference images not displayed]

FINDINGS: No acute fracture or dislocation. Trace joint effusion. Enthesophyte
present at the patella. Joint spaces fairly well-maintained. Osseous
mineralization normal. No soft tissue abnormality.
IMPRESSION: 1. No acute osseous abnormality about the left knee.
2. Trace joint effusion.

## 2016-06-18 NOTE — ED Notes (Signed)
See triage note   States he developed left knee a couple of days ago  w/o injury  Min swelling noted  States pain is anterior

## 2016-06-18 NOTE — ED Provider Notes (Signed)
[facility] Emergency Department Provider Note  ____________________________________________  Time seen: Approximately 1:56 PM  I have reviewed the triage vital signs and the nursing notes.   HISTORY  Chief Complaint Knee Pain    HPI Hector [name] is a 51 y.o. male that presents to the emergency department with left knee pain for 5 days. He states that this happened several times in the past but it usually resolves within a couple days. Pain is in the front of his knee. No sensation changes in feet or toes. Patient does a lot of lifting for work. No recent injury. Patient injured knee 5 years ago after falling on it. He had surgery on right knee 5 years ago.  Patient cannot take any pain medication by mouth. He applied a knee brace this morning, which helped. He denies fever, SOB, CP, nausea, vomiting, abdominal pain, calf pain.    Past Medical History:  Diagnosis Date  . Alcohol abuse   . 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 No inducible ischemia, fixed inferoapical defect = Infarct vs.artifact (not commensurate with extent of reduced EF); b) Echo 8/'12: Mild LV dilation, Mod-Severe global hypokinesis - EF 25-35%. Mild-moderate RV dilation no significant bowel lesion mild-moderate MR; c) Echo Jan 2016: EF 20-25%, high LAP, mild-moderate LV dilation, severe L  . Atrial fibrillation ([place])    a. new onset s/p unsuccessful TEE/DCCV on 08/16/2014; b. on apixaban   . Atrial flutter ([place])    a. new onset 07/2014; b. s/p unsuccessful TEE/DCCV 08/16/2014; c. on apixaban   . CHF (congestive heart failure) ([place])   . Chronic combined systolic and diastolic heart failure, NYHA class 2 ([place])    a. see above  . CKD (chronic kidney disease), stage III   . Elevated troponin    Chronically elevated. - level was 0.17-0.10 during recent admission  . Essential hypertension   . GI bleed 11/2013  . Hyperlipidemia    . Myocardial infarct ([place]) 2004  . Pancreatitis   . Sleep-disordered breathing    Has yet to have a sleep study    Patient Active Problem List   Diagnosis Date Noted  . Obstructive sleep apnea 04/21/2016  . GI bleed 10/08/2015  . Nonischemic cardiomyopathy ([place]) 05/26/2015  . Chronic diastolic heart failure ([place]) 04/01/2015  . Essential hypertension 04/01/2015  . Sinus bradycardia 01/29/2015  . Paroxysmal atrial fibrillation ([place]) 08/16/2014  . Paroxysmal atrial flutter ([place]) 08/14/2014    Class: Acute  . Chronic kidney disease (CKD), stage III (moderate) 07/02/2014  . Hypertensive heart disease   . Hyperlipidemia     Past Surgical History:  Procedure Laterality Date  . CARDIAC CATHETERIZATION N/A 01/09/2015   Procedure: Left Heart Cath and Coronary Angiography;  Surgeon: [name], MD;  Location: [place] CV LAB;  Service: Cardiovascular;  Laterality: N/A;  . ELECTROPHYSIOLOGIC STUDY N/A 08/16/2014   Procedure: CARDIOVERSION;  Surgeon: [name], MD;  Location: [facility] ORS;  Service: Cardiovascular;  Laterality: N/A;  . FLEXIBLE SIGMOIDOSCOPY N/A 10/10/2015   Procedure: FLEXIBLE SIGMOIDOSCOPY;  Surgeon: [name], MD;  Location: [facility] ENDOSCOPY;  Service: Endoscopy;  Laterality: N/A;  . KNEE SURGERY Right   . NM MYOVIEW LTD  November 2011   No ischemia or infarction. EF 50-55% ( no improvement from 2009 Myoview EF of 28%  . TEE WITHOUT CARDIOVERSION N/A 08/16/2014   Procedure: TRANSESOPHAGEAL ECHOCARDIOGRAM (TEE);  Surgeon: [name], MD;  Location:  [facility] ORS;  Service: Cardiovascular;  Laterality: N/A;    Prior to Admission medications   Medication Sig Start Date End Date Taking? Authorizing Provider  amiodarone (PACERONE) 200 MG tablet Take 1 tablet (200 mg total) by mouth daily. 05/22/15   [name], MD  amLODipine (NORVASC) 10 MG tablet Take 1 tablet (10 mg total) by mouth daily. 01/22/16 04/28/16  Wende Bushy, MD  apixaban (ELIQUIS) 5 MG  TABS tablet Take 1 tablet (5 mg total) by mouth 2 (two) times daily. 09/12/14   Alisa Graff, FNP  atorvastatin (LIPITOR) 80 MG tablet Take 1 tablet (80 mg total) by mouth daily. 12/04/15   Alisa Graff, FNP  carvedilol (COREG) 3.125 MG tablet Take 1 tablet (3.125 mg total) by mouth 2 (two) times daily with a meal. 10/10/15   Henreitta Leber, MD  furosemide (LASIX) 40 MG tablet Take 1 tablet (40 mg total) by mouth daily. 05/22/15   [name], MD  hydrocortisone-pramoxine [facility]) 2.5-1 % rectal cream Place 1 application rectally 3 (three) times daily. 10/10/15   Henreitta Leber, MD  levothyroxine (SYNTHROID) 50 MCG tablet Take 1 tablet (50 mcg total) by mouth daily before breakfast. 06/18/15   [name], MD  methylcellulose (CITRUCEL) oral powder Take 1 packet by mouth daily as needed. 10/10/15   Henreitta Leber, MD  omeprazole (PRILOSEC) 20 MG capsule Take 1 capsule (20 mg total) by mouth daily. 05/22/15   [name], MD  valsartan (DIOVAN) 80 MG tablet Take 1 tablet (80 mg total) by mouth daily. 12/11/15   Wende Bushy, MD    Allergies Eggs or egg-derived products  Family History  Problem Relation Age of Onset  . Hypertension Mother   . Hyperlipidemia Mother   . Diabetes Mother     Social History Social History  Substance Use Topics  . Smoking status: Former Smoker    Packs/day: 1.00    Years: 12.00    Quit date: 02/23/1998  . Smokeless tobacco: Never Used  . Alcohol use No     Comment: 4 heavy alcohol drinker. Quit 2 years ago.     Review of Systems  Constitutional: No fever/chills Cardiovascular: No chest pain. Respiratory: No cough. No SOB. Gastrointestinal: No abdominal pain.  No nausea, no vomiting.  Musculoskeletal: Positive for knee pain. Skin: Negative for rash, abrasions, lacerations, ecchymosis. Neurological: Negative for headaches, numbness or tingling   ____________________________________________   PHYSICAL EXAM:  VITAL SIGNS: ED  Triage Vitals  Enc Vitals Group     BP 06/18/16 1233 (!) 153/108     Pulse Rate 06/18/16 1233 66     Resp 06/18/16 1233 16     Temp 06/18/16 1233 98 F (36.7 C)     Temp Source 06/18/16 1233 Oral     SpO2 06/18/16 1233 98 %     Weight 06/18/16 1233 (!) 313 lb (142 kg)     Height 06/18/16 1233 5\' 11"  (1.803 m)     Head Circumference --      Peak Flow --      Pain Score 06/18/16 1231 8     Pain Loc --      Pain Edu? --      Excl. in GC? --       Eyes: Conjunctivae are normal. PERRL. EOMI. Head: Atraumatic. ENT:      Ears:      Nose: No congestion/rhinnorhea.      Mouth/Throat: Mucous membranes are moist.  Neck: No stridor.   Cardiovascular: Normal rate, regular rhythm.  Good peripheral circulation. Respiratory: Normal respiratory effort without tachypnea or retractions. Lungs CTAB. Good air entry to the bases with no decreased or absent breath sounds. Musculoskeletal: Full range of motion to all extremities. No gross deformities appreciated. No tenderness to palpation. No visible swelling. No erythema. Negative anterior drawer, posterior drawer, valgus, varus, mcMurray, patella apprehension, apley grind. Neurologic:  Normal speech and language. No gross focal neurologic deficits are appreciated.  Skin:  Skin is warm, dry and intact. No rash noted.    ____________________________________________   LABS (all labs ordered are listed, but only abnormal results are displayed)  Labs Reviewed - No data to display ____________________________________________  EKG   ____________________________________________  RADIOLOGY Robinette Haines, personally viewed and evaluated these images (plain radiographs) as part of my medical decision making, as well as reviewing the written report by the radiologist.  Dg Knee Complete 4 Views Left  Result Date: 06/18/2016 CLINICAL DATA:  Initial evaluation for acute pain and swelling. No injury. EXAM: LEFT KNEE - COMPLETE 4+ VIEW COMPARISON:   Prior radiograph from 01/30/2015. FINDINGS: No acute fracture or dislocation. Trace joint effusion. Enthesophyte present at the patella. Joint spaces fairly well-maintained. Osseous mineralization normal. No soft tissue abnormality. IMPRESSION: 1. No acute osseous abnormality about the left knee. 2. Trace joint effusion. Electronically Signed   By: Jeannine Boga M.D.   On: 06/18/2016 14:16    ____________________________________________    PROCEDURES  Procedure(s) performed:    Procedures    Medications - No data to display   ____________________________________________   INITIAL IMPRESSION / ASSESSMENT AND PLAN / ED COURSE  Pertinent labs & imaging results that were available during my care of the patient were reviewed by me and considered in my medical decision making (see chart for details).  Review of the  [place]RS was performed in accordance of the [place] prior to dispensing any controlled drugs.     Patient's diagnosis is consistent with joint effusion. Vital signs and exam are reassuring. Knee xray negative for acute bony abnormalities. Knee was ace wrapped. Patient is to follow up with orthopedics as directed. Patient is given ED precautions to return to the ED for any worsening or new symptoms.     ____________________________________________  FINAL CLINICAL IMPRESSION(S) / ED DIAGNOSES  Final diagnoses:  Effusion of left knee      NEW MEDICATIONS STARTED DURING THIS VISIT:  Discharge Medication List as of 06/18/2016  3:03 PM          This chart was dictated using voice recognition software/Dragon. Despite best efforts to proofread, errors can occur which can change the meaning. Any change was purely unintentional.    Laban Emperor, PA-C 06/18/16 [place], MD 06/19/16 [phone]

## 2016-06-18 NOTE — ED Triage Notes (Signed)
States left knee pain. Denies any known injury, pt using cane to ambulate

## 2016-06-25 ENCOUNTER — Ambulatory Visit: Payer: Medicaid Other | Admitting: Cardiology

## 2016-07-01 ENCOUNTER — Ambulatory Visit (INDEPENDENT_AMBULATORY_CARE_PROVIDER_SITE_OTHER): Payer: Medicaid Other | Admitting: Cardiology

## 2016-07-01 ENCOUNTER — Encounter: Payer: Self-pay | Admitting: Cardiology

## 2016-07-01 VITALS — BP 146/98 | HR 52 | Ht 71.0 in | Wt 305.8 lb

## 2016-07-01 DIAGNOSIS — I10 Essential (primary) hypertension: Secondary | ICD-10-CM | POA: Diagnosis not present

## 2016-07-01 DIAGNOSIS — E6609 Other obesity due to excess calories: Secondary | ICD-10-CM

## 2016-07-01 DIAGNOSIS — R001 Bradycardia, unspecified: Secondary | ICD-10-CM | POA: Diagnosis not present

## 2016-07-01 DIAGNOSIS — E784 Other hyperlipidemia: Secondary | ICD-10-CM | POA: Diagnosis not present

## 2016-07-01 DIAGNOSIS — I480 Paroxysmal atrial fibrillation: Secondary | ICD-10-CM | POA: Diagnosis not present

## 2016-07-01 DIAGNOSIS — Z6841 Body Mass Index (BMI) 40.0 and over, adult: Secondary | ICD-10-CM | POA: Diagnosis not present

## 2016-07-01 DIAGNOSIS — E7849 Other hyperlipidemia: Secondary | ICD-10-CM

## 2016-07-01 DIAGNOSIS — I5023 Acute on chronic systolic (congestive) heart failure: Secondary | ICD-10-CM

## 2016-07-01 DIAGNOSIS — IMO0001 Reserved for inherently not codable concepts without codable children: Secondary | ICD-10-CM

## 2016-07-01 MED ORDER — SACUBITRIL-VALSARTAN 24-26 MG PO TABS: 1.0000 | ORAL_TABLET | Freq: Two times a day (BID) | ORAL | 3 refills | Status: DC

## 2016-07-01 NOTE — Patient Instructions (Addendum)
Medication Instructions:  Your physician has recommended you make the following change in your medication:  1. STOP Valsartan (Diovan) 2. START Entresto (Sacubitril/valsartan) 24/26 mg 1 tablet twice a day  Medication Samples have been provided to the patient.  Drug name: Delene Loll       Strength: 24/26        Qty: 2 boxes  LOT: [id number]  Exp.Date: Oct 2018    Dosing instructions: 1 tablet twice a day   Labwork: Your physician recommends that you return for lab work in: 1 week for repeat labs to be done. Go to [facility] of the hospital and check in at the front desk.    Testing/Procedures: Your physician has recommended that you have a pulmonary function test. Pulmonary Function Tests are a group of tests that measure how well air moves in and out of your lungs.  Your physician has requested that you regularly monitor and record your blood pressure readings at home. Please use the same machine at the same time of day to check your readings and record them to bring to your follow-up visit.    Follow-Up: Your physician recommends that you schedule a follow-up appointment in: 4 weeks.  It was a pleasure seeing you today here in the office. Please do not hesitate to give [place] a call back if you have any further questions. Luther, BSN     Pulmonary Function Tests Pulmonary function tests (PFTs) are used to measure how well your lungs work, find out what is causing your lung problems, and figure out the best treatment for you. You may have PFTs:  When you have an illness involving the lungs.  To follow changes in your lung function over time if you have a chronic lung disease.  If you are an Nature conservation officer. This checks the effects of being exposed to chemicals over a long period of time.  To check lung function before having surgery or other procedures.  To check your lungs if you smoke.  To check if prescribed medicines or treatments are  helping your lungs. Your results will be compared to the expected lung function of someone with healthy lungs who is similar to you in:  Age.  Gender.  Height.  Weight.  Race or ethnicity. This is done to show how your lungs compare to normal lung function (percent predicted). This is how your health care provider knows if your lung function is normal or not. If you have had PFTs done before, your health care provider will compare your current results with past results. This shows if your lung function is better, worse, or the same as before. Tell a health care provider about:  Any allergies you have.  All medicines you are taking, including inhaler or nebulizer medicines, vitamins, herbs, eye drops, creams, and over-the-counter medicines.  Any blood disorders you have.  Any surgeries you have had, especially recent eye surgery, abdominal surgery, or chest surgery. These can make PFTs difficult or unsafe.  Any medical conditions you have, including chest pain or heart problems, tuberculosis, or respiratory infections such as pneumonia, a cold, or the flu.  Any fear of being in closed spaces (claustrophobia). Some of your tests may be in a closed space. What are the risks? Generally, this is a safe procedure. However, problems may occur, including:  Light-headedness due to over-breathing (hyperventilation).  An asthma attack from deep breathing.  A collapsed lung. What happens before the procedure?  Take over-the-counter  and prescription medicines only as told by your health care provider. If you take inhaler or nebulizer medicines, ask your health care provider which medicines you should take on the day of your testing. Some inhaler medicines may interfere with PFTs if they are taken shortly before the tests.  Follow your health care provider's instructions on eating and drinking restrictions. This may include avoiding eating large meals and drinking alcohol before the  testing.  Do not use any products that contain nicotine or tobacco, such as cigarettes and e-cigarettes. If you need help quitting, ask your health care provider.  Wear comfortable clothing that will not interfere with breathing. What happens during the procedure?  You will be given a soft nose clip to wear. This is done so all of your breaths will go through your mouth instead of your nose.  You will be given a germ-free (sterile) mouthpiece. It will be attached to a machine that measures your breathing (spirometer).  You will be asked to do various breathing maneuvers. The maneuvers will be done by breathing in (inhaling) and breathing out (exhaling). You may be asked to repeat the maneuvers several times before the testing is done.  It is important to follow the instructions exactly to get accurate results. Make sure to blow as hard and as fast as you can when you are told to do so.  You may be given a medicine that makes the small air passages in your lungs larger (bronchodilator) after testing has been done. This medicine will make it easier for you to breathe.  The tests will be repeated after the bronchodilator has taken effect.  You will be monitored carefully during the procedure for faintness, dizziness, trouble breathing, or any other problems. The procedure may vary among health care providers and hospitals. What happens after the procedure?  It is up to you to get your test results. Ask your health care provider, or the department that is doing the tests, when your results will be ready. After you have received your test results, talk with your health care provider about treatment options, if necessary. Summary  Pulmonary function tests (PFTs) are used to measure how well your lungs work, find out what is causing your lung problems, and figure out the best treatment for you.  Wear comfortable clothing that will not interfere with breathing.  It is up to you to get your test  results. After you have received them, talk with your health care provider about treatment options, if necessary. This information is not intended to replace advice given to you by your health care provider. Make sure you discuss any questions you have with your health care provider. Document Released: 10/03/2003 Document Revised: 01/02/2016 Document Reviewed: 01/02/2016 Elsevier Interactive Patient Education  2017 [employer].

## 2016-07-01 NOTE — Progress Notes (Signed)
Cardiology Office Note   Date:  07/01/2016   ID:  Hector [name], DOB Aug 11, 1965, MRN [id number]  Referring Doctor:  [name], MD   Cardiologist:   Wende Bushy, MD   Reason for consultation:  Chief Complaint  Patient presents with  . other    3 month follow up after ECHO. Patient denies chest pain. Meds reviewed verbally with patient.       History of Present Illness: Hector [name] is a 51 y.o. male who presents for Follow-up for CHF. Patient used to see Dr. Alfonse Spruce is a 51 y.o. male with a PMH below who presents today for cardiology followup for her heart failure (combined systolic and diastolic) in the context of atrial flutter/fibrillation, Hypertension  Review of records show: He apparently has a significant history of alcoholic (nonischemic) cardiomyopathy dating back 2009 - with an echocardiogram showing an EF as low as 20%. Apparently this improved after he quit drinking alcohol. He was actually admitted to the hospital at [facility] August 14 2014 with atrial flutter with rapid response. He was cardioverted(TEE/DCCV), then reverted to atrial fibrillation and was started on amiodarone as well as Eliquis.  He saw Dr. [name] from electrophysiology in the clinic on August 21 2014 to determine further treatment options for atrial fibrillation/flutter as well as consider possibility of ICD referral.  Dr. [name] recommendation was to continue the amiodarone as he would not likely be a good candidate for ablative procedures to be discussed consider the possibility of cardiac catheterization to exclude an ischemic component of cardiomyopathy. And also agreed that he would be an appropriate candidate for ICD if indicated.  An echo December 2016 showed recovery of systolic function. Patient denies chest pain and shortness of breath. He struggles with continued blood pressure elevation. He has not been able to take the higher dose of Entresto and has  been relying on samples of the lower dose. His blood pressure has been noted to be elevated on the lower dose, better on the higher dose. Insurance is not coughing this medication according to the patient.  Since last visit, patient has had no issues. When asked about his blood pressure log, he reports that his blood pressure overall at home is okay. When the as about the specific blood pressure measurements, he says that he has blood pressures in the 140s over 1 teens. Per last visit, he was supposed to let us know if his blood pressure has been greater than 140/80.  Patient denies chest pain, shortness of breath, PND, orthopnea, edema.  ROS:  Please see the history of present illness. Aside from mentioned under HPI, all other systems are reviewed and negative.       Past Medical History:  Diagnosis Date  . Alcohol abuse   . 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 No inducible ischemia, fixed inferoapical defect = Infarct vs.artifact (not commensurate with extent of reduced EF); b) Echo 8/'12: Mild LV dilation, Mod-Severe global hypokinesis - EF 25-35%. Mild-moderate RV dilation no significant bowel lesion mild-moderate MR; c) Echo Jan 2016: EF 20-25%, high LAP, mild-moderate LV dilation, severe L  . Atrial fibrillation ([place])    a. new onset s/p unsuccessful TEE/DCCV on 08/16/2014; b. on apixaban   . Atrial flutter ([place])    a. new onset 07/2014; b. s/p unsuccessful TEE/DCCV 08/16/2014; c. on apixaban   . CHF (congestive heart failure) ([place])   .  Chronic combined systolic and diastolic heart failure, NYHA class 2 ([place])    a. see above  . CKD (chronic kidney disease), stage III   . Elevated troponin    Chronically elevated. - level was 0.17-0.10 during recent admission  . Essential hypertension   . GI bleed 11/2013  . Hyperlipidemia   . Myocardial infarct ([place]) 2004  . Pancreatitis   . Sleep-disordered breathing    Has yet to  have a sleep study    Past Surgical History:  Procedure Laterality Date  . CARDIAC CATHETERIZATION N/A 01/09/2015   Procedure: Left Heart Cath and Coronary Angiography;  Surgeon: [name], MD;  Location: [place] [facility];  Service: Cardiovascular;  Laterality: N/A;  . ELECTROPHYSIOLOGIC STUDY N/A 08/16/2014   Procedure: CARDIOVERSION;  Surgeon: [name], MD;  Location: [facility] OR[facility];  Service: Cardiovascular;  Laterality: N/A;  . FLEXIBLE SIGMOIDOSCOPY N/A 10/10/2015   Procedure: FLEXIBLE SIGMOIDOSCOPY;  Surgeon: [name], MD;  Location: [facility];  Service: Endoscopy;  Laterality: N/A;  . KNEE SURGERY Right   . NM MYOVIEW LTD  November 2011   No ischemia or infarction. EF 50-55% ( no improvement from 2009 Myoview EF of 28%  . TEE WITHOUT CARDIOVERSION N/A 08/16/2014   Procedure: TRANSESOPHAGEAL ECHOCARDIOGRAM (TEE);  Surgeon: [name], MD;  Location: [facility] ORS;  Service: Cardiovascular;  Laterality: N/A;     reports that he quit smoking about 18 years ago. He has a 12.00 pack-year smoking history. He has never used smokeless tobacco. He reports that he does not drink alcohol or use drugs.   family history includes Diabetes in his mother; Hyperlipidemia in his mother; Hypertension in his mother.   Outpatient Medications Prior to Visit  Medication Sig Dispense Refill  . amiodarone (PACERONE) 200 MG tablet Take 1 tablet (200 mg total) by mouth daily. 60 tablet 3  . apixaban (ELIQUIS) 5 MG TABS tablet Take 1 tablet (5 mg total) by mouth 2 (two) times daily. 60 tablet 5  . atorvastatin (LIPITOR) 80 MG tablet Take 1 tablet (80 mg total) by mouth daily. 30 tablet 5  . carvedilol (COREG) 3.125 MG tablet Take 1 tablet (3.125 mg total) by mouth 2 (two) times daily with a meal. 60 tablet 1  . furosemide (LASIX) 40 MG tablet Take 1 tablet (40 mg total) by mouth daily. 30 tablet 5  . hydrocortisone-pramoxine (ANALPRAM HC) 2.5-1 % rectal cream Place 1 application  rectally 3 (three) times daily. 30 g 0  . levothyroxine (SYNTHROID) 50 MCG tablet Take 1 tablet (50 mcg total) by mouth daily before breakfast. 30 tablet 5  . methylcellulose (CITRUCEL) oral powder Take 1 packet by mouth daily as needed. 245 tablet 0  . omeprazole (PRILOSEC) 20 MG capsule Take 1 capsule (20 mg total) by mouth daily. 30 capsule 5  . valsartan (DIOVAN) 80 MG tablet Take 1 tablet (80 mg total) by mouth daily. 30 tablet 6  . amLODipine (NORVASC) 10 MG tablet Take 1 tablet (10 mg total) by mouth daily. 30 tablet 6   No facility-administered medications prior to visit.      Allergies: Eggs or egg-derived products    PHYSICAL EXAM: VS:  BP (!) 146/98 (BP Location: Left Arm, Patient Position: Sitting, Cuff Size: Normal)   Pulse (!) 52   Ht '5\' 11"'  (1.803 m)   Wt (!) 305 lb 12 oz (138.7 kg)   BMI 42.64 kg/m  , Body mass index is 42.64 kg/m. Wt Readings from  Last 3 Encounters:  07/01/16 (!) 305 lb 12 oz (138.7 kg)  06/18/16 (!) 313 lb (142 kg)  04/28/16 (!) 314 lb 12 oz (142.8 kg)  PHYSICAL EXAM: VS:  BP (!) 146/98 (BP Location: Left Arm, Patient Position: Sitting, Cuff Size: Normal)   Pulse (!) 52   Ht '5\' 11"'  (1.803 m)   Wt (!) 305 lb 12 oz (138.7 kg)   BMI 42.64 kg/m  , BMI Body mass index is 42.64 kg/m. GENERAL:  well developed, well nourished, obese, not in acute distress HEENT: normocephalic, pink conjunctivae, anicteric sclerae, no xanthelasma, normal dentition, oropharynx clear NECK:  no neck vein engorgement, JVP normal, no hepatojugular reflux, carotid upstroke brisk and symmetric, no bruit, no thyromegaly, no lymphadenopathy LUNGS:  good respiratory effort, clear to auscultation bilaterally CV:  PMI not displaced, no thrills, no lifts, S1 and S2 within normal limits, no palpable S3 or S4, no murmurs, no rubs, no gallops ABD:  Soft, nontender, nondistended, normoactive bowel sounds, no abdominal aortic bruit, no hepatomegaly, no splenomegaly MS: nontender back,  no kyphosis, no scoliosis, no joint deformities EXT:  2+ DP/PT pulses, no edema, no varicosities, no cyanosis, no clubbing SKIN: warm, nondiaphoretic, normal turgor, no ulcers NEUROPSYCH: alert, oriented to person, place, and time, sensory/motor grossly intact, normal mood, appropriate affect     Recent Labs: 10/08/2015: ALT 24; TSH 47.715 12/12/2015: BUN 23; Creatinine, Ser 2.68; Hemoglobin 14.3; Platelets 202; Potassium 4.1; Sodium 138   Lipid Panel    Component Value Date/Time   CHOL 387 (H) 12/12/2015 0949   CHOL 216 (H) 05/22/2015 0905   CHOL 118 03/08/2014 0410   TRIG 218 (H) 12/12/2015 0949   TRIG 95 03/08/2014 0410   HDL 56 12/12/2015 0949   HDL 37 (L) 05/22/2015 0905   HDL 28 (L) 03/08/2014 0410   CHOLHDL 6.9 12/12/2015 0949   VLDL 44 (H) 12/12/2015 0949   VLDL 19 03/08/2014 0410   LDLCALC 287 (H) 12/12/2015 0949   LDLCALC 139 (H) 05/22/2015 0905   LDLCALC 71 03/08/2014 0410     Other studies Reviewed:  EKG:  The ekg from 12/11/2015 was personally reviewed by me and it revealed sinus bradycardia, 56 BPM. Incomplete left bundle branch block. QT is 480 ms, QTC 473 ms. Can change from EKG 10/08/2015.  Additional studies/ records that were reviewed personally reviewed by me today include:   2D Echo 1/13/'16:  ? Severely reduced EF of 20-25%. Elevated LAP, impaired LV relaxation (GR 1 DD) mild-moderate LV dilation with severe LA dilation. Mild-mild MR and TR.  TEE/DCCV 6/23/'16:      Procedures    Left Heart Cath and Coronary Angiography : 01/09/2015    Conclusion    1. Angiographically minimal CAD 2. Non-ishcemic Cardiomyopathy 3. Systemic Hypertension with Mild-Moderately increase LVEDP after pre-cath hydration. This confirms the presumed diagnosis of Non-ischemic Cardiomyopathy. Plan:  Discharge home after Bed-rest & TR band removal  Continue current Cardiomyopathy Medications - BB, Entresto & Lasix  Will refer back to Dr. Caryl Comes for reconsideration  of ICD.   Echo 02/21/2015 - Challenging image quality. Rhythm is normal sinus. - Left ventricle: The cavity size was normal. There was mild concentric hypertrophy. Systolic function was normal. EF ~50% to 55%.  Unable to exclude inferior-posterior wall hypokinesis. Left ventricular diastolic function parameters were normal. - Mitral valve: There was mild regurgitation. - Left atrium: The atrium was mildly dilated. - Right ventricle: Systolic function was normal. - Pulmonary arteries: Systolic pressure was within the normal  range.  Echo 06/04/2016: Left ventricle: The cavity size was mildly dilated. There was   mild concentric hypertrophy. Systolic function was moderately to   severely reduced. The estimated ejection fraction was in the   range of 30% to 35%. Diffuse hypokinesis. Features are consistent   with a pseudonormal left ventricular filling pattern, with   concomitant abnormal relaxation and increased filling pressure   (grade 2 diastolic dysfunction). - Mitral valve: There was mild regurgitation. - Left atrium: The atrium was moderately dilated. - Pulmonary arteries: Systolic pressure could not be accurately   estimated.  ASSESSMENT AND PLAN:  Congestive heart failure, systolic dysfunction as per last echo 06/04/2016, acute on chronic  Nonischemic cardiomyopathy based on negative CAD on left heart catheterization History of alcohol abuse leading to nonischemic cardiomyopathy Cardiomyopathy likely related to uncontrolled hypertension Unfortunately, patient has not been informing our office of elevated blood pressure. Not really sure that he is doing a blood pressure log. Continue amlodipine 10, carvedilol unable to up titrate due to heart rate. Continue Lasix. Stop Diovan and start Entresto 24/26 bid. Check blood work in one week. BMP from care everywhere 06/11/2016 shows a potassium of 4 bun 30, creatinine of 2.95. His labs have been monitored by his PCP. Also discussed  that per our records, his weight has gone up at least 20 pounds from December 2016. This may be contribution to poorly controlled blood pressure. Follow-up in the office in 2-4 weeks' time. Optimize medical therapy and reevaluate EF post 3 months. He was previously seen by Dr. Caryl Comes for evaluation for ICD but at that time, EF had improved.  Per review: workup for secondary causes of hypertension to include: Borderline fractionated plasma metanephrines, PA-C/PRA within normal limits ----previously referred to endocrine service, unsure if patient followed up renal artery ultrasound unable to rule out renal artery stenosis due to technical difficulty, renal cysts noted  -----refer back to renal service  Continue with CPAP Patient advised to follow-up with PCP for hypothyroidism.  Paroxysmal atrial flutter Paroxysmal atrial fibrillation Remains in sinus rhythm. QT 480 ms, QTC 470 ms from today. Continue amiodarone 200 mg daily, and eliquis 28m twice a day.  We will be checking CMP, TSH, free T4, TFTs.  Hyperlipidemia PCP following labs.  Sinus bradycardia Recommendations to symptoms from last visit March 2018: Patient is on lowest dose of carvedilol. Patient on amiodarone. No symptoms associated with the bradycardia. QTC is acceptable.QT 480 ms, QTC 470 ms from today.   Morbid obesity Body mass index is 42.64 kg/m. Recommend aggressive weight loss through diet and increased physical activity.  We discussed at length how important weight control especially in his case. We think that weight gain has contribution due to blood pressure being uncontrolled, and hence systolic dysfunction.  Current medicines are reviewed at length with the patient today.  The patient does not have concerns regarding medicines.  Labs/ tests ordered today include:  Orders Placed This Encounter  Procedures  . Comp Met (CMET)  . TSH  . T4, free  . Pulmonary Function Test [facility] Only  . EKG 12-Lead    I had a  lengthy and detailed discussion with the patient regarding diagnoses, prognosis, diagnostic options, treatment options , and side effects of medications.   I counseled the patient on importance of lifestyle modification including heart healthy diet, regular physical activity .   Disposition:   FU With cardiology in 2-4 weeks  I spent at least 40 minutes with the patient today and more  than 50% of the time was spent counseling the patient and coordinating care.    Signed, Wende Bushy, MD  07/01/2016 12:20 PM    [place]  This note was generated in part with voice recognition software and I apologize for any typographical errors that were not detected and corrected.

## 2016-07-01 NOTE — Addendum Note (Signed)
Addended by: Valora Corporal on: 07/01/2016 03:54 PM   Modules accepted: Orders

## 2016-07-02 ENCOUNTER — Telehealth: Payer: Self-pay | Admitting: Cardiology

## 2016-07-02 NOTE — Telephone Encounter (Signed)
Called [employer] at [phone] for prior authorization of Entresto 24-26 mg twice a day. It requires further review and ref number [id number]. Told to call back tomorrow for final decision.

## 2016-07-03 NOTE — Telephone Encounter (Signed)
Called and prior authorization has been approved until 06/27/17.

## 2016-07-16 ENCOUNTER — Ambulatory Visit: Payer: Medicaid Other | Admitting: Cardiology

## 2016-07-21 ENCOUNTER — Telehealth: Payer: Self-pay | Admitting: Family

## 2016-07-21 ENCOUNTER — Encounter: Payer: Self-pay | Admitting: Family

## 2016-07-21 ENCOUNTER — Ambulatory Visit: Payer: Medicaid Other | Attending: Family | Admitting: Family

## 2016-07-21 VITALS — BP 160/112 | HR 60 | Resp 20 | Ht 71.0 in | Wt 299.0 lb

## 2016-07-21 DIAGNOSIS — Z9889 Other specified postprocedural states: Secondary | ICD-10-CM | POA: Insufficient documentation

## 2016-07-21 DIAGNOSIS — E784 Other hyperlipidemia: Secondary | ICD-10-CM | POA: Diagnosis not present

## 2016-07-21 DIAGNOSIS — Z91012 Allergy to eggs: Secondary | ICD-10-CM | POA: Diagnosis not present

## 2016-07-21 DIAGNOSIS — Z7902 Long term (current) use of antithrombotics/antiplatelets: Secondary | ICD-10-CM | POA: Insufficient documentation

## 2016-07-21 DIAGNOSIS — I5022 Chronic systolic (congestive) heart failure: Secondary | ICD-10-CM | POA: Insufficient documentation

## 2016-07-21 DIAGNOSIS — I426 Alcoholic cardiomyopathy: Secondary | ICD-10-CM | POA: Diagnosis not present

## 2016-07-21 DIAGNOSIS — I5023 Acute on chronic systolic (congestive) heart failure: Secondary | ICD-10-CM | POA: Insufficient documentation

## 2016-07-21 DIAGNOSIS — Z87891 Personal history of nicotine dependence: Secondary | ICD-10-CM | POA: Insufficient documentation

## 2016-07-21 DIAGNOSIS — I480 Paroxysmal atrial fibrillation: Secondary | ICD-10-CM | POA: Insufficient documentation

## 2016-07-21 DIAGNOSIS — I252 Old myocardial infarction: Secondary | ICD-10-CM | POA: Insufficient documentation

## 2016-07-21 DIAGNOSIS — I10 Essential (primary) hypertension: Secondary | ICD-10-CM

## 2016-07-21 DIAGNOSIS — I130 Hypertensive heart and chronic kidney disease with heart failure and stage 1 through stage 4 chronic kidney disease, or unspecified chronic kidney disease: Secondary | ICD-10-CM | POA: Diagnosis not present

## 2016-07-21 DIAGNOSIS — N183 Chronic kidney disease, stage 3 (moderate): Secondary | ICD-10-CM | POA: Diagnosis not present

## 2016-07-21 DIAGNOSIS — Z833 Family history of diabetes mellitus: Secondary | ICD-10-CM | POA: Diagnosis not present

## 2016-07-21 DIAGNOSIS — Z8249 Family history of ischemic heart disease and other diseases of the circulatory system: Secondary | ICD-10-CM | POA: Insufficient documentation

## 2016-07-21 DIAGNOSIS — E7849 Other hyperlipidemia: Secondary | ICD-10-CM

## 2016-07-21 DIAGNOSIS — Z955 Presence of coronary angioplasty implant and graft: Secondary | ICD-10-CM | POA: Insufficient documentation

## 2016-07-21 DIAGNOSIS — G4733 Obstructive sleep apnea (adult) (pediatric): Secondary | ICD-10-CM | POA: Insufficient documentation

## 2016-07-21 NOTE — Telephone Encounter (Signed)
Patient was seen in the HF Clinic today with an elevated blood pressure. He has resumed entresto 24/26mg  twice daily on 07/01/16. Discussed options of either increasing entresto to the 49/51mg  dose or increasing his furosemide.  Advised him that we needed to get lab work today before making that adjustment. Order placed in EPIC, armband placed on patient and he was sent to PAT with the order. PAT staff came into the office and said that patient "walked out saying he wasn't going to wait to get his lab work drawn".   Attempted to call patient but his phone just rings without an answer and no option to leave a message. Will continue to try to reach patient by phone and if unable to reach him today, will mail a letter to his home advising him of the importance of getting his lab results prior to changing his medications that could potentially effect his renal function.   He was supposed to get lab work drawn after entresto was resumed but didn't at that time because he said that he didn't have a ride.

## 2016-07-21 NOTE — Progress Notes (Signed)
[name], male    DOB: 12/29/1965, 51 y.o.   MRN: [id number]  HPI  Hector [name] is a 51 y/o male with a history of MI, hyperlipidemia, HTN, CKD, atrial fibrillation, alcohol use, cardiomyopathy, and CHF.   Last echo was done on 06/02/16 which showed an EF of 30-35%. Prior echo on 02/21/15 which showed a normalization of his EF which was 50-55% with mild Hector. No longer ICD candidate. Prior echo on 03/07/14 showed an EF of 20-25% with mild/mod Hector. He was started on [facility] Feb 2016. Last left heart cath was done 01/09/15.   Last seen in the ED on 06/18/16 for knee pain. He was treated and discharged from the ED. Was last admitted on 10/08/15 with a GI bleed. GI was consulted and a flex sigmoidoscopy was done which showed hemorrhoids.   He presents today for a follow-up visit for his HF. He denies any swelling in his legs or SOB. Has been weighing consistently and says he has lost 14 pounds recently. He was started on Entresto on 07/01/16, but has not had labs drawn due to transportation. Reports sleeping good at night with his CPAP.   Past Medical History:  Diagnosis Date  . Alcohol abuse   . 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 No inducible ischemia, fixed inferoapical defect = Infarct vs.artifact (not commensurate with extent of reduced EF); b) Echo 8/'12: Mild LV dilation, Mod-Severe global hypokinesis - EF 25-35%. Mild-moderate RV dilation no significant bowel lesion mild-moderate Hector; c) Echo Jan 2016: EF 20-25%, high LAP, mild-moderate LV dilation, severe L  . Atrial fibrillation ([place])    a. new onset s/p unsuccessful TEE/DCCV on 08/16/2014; b. on apixaban   . Atrial flutter ([place])    a. new onset 07/2014; b. s/p unsuccessful TEE/DCCV 08/16/2014; c. on apixaban   . CHF (congestive heart failure) ([place])   . Chronic combined systolic and diastolic heart failure, NYHA class 2 ([place])    a. see above  . CKD (chronic  kidney disease), stage III   . Elevated troponin    Chronically elevated. - level was 0.17-0.10 during recent admission  . Essential hypertension   . GI bleed 11/2013  . Hyperlipidemia   . Myocardial infarct ([place]) 2004  . Pancreatitis   . Sleep-disordered breathing    Has yet to have a sleep study   Past Surgical History:  Procedure Laterality Date  . CARDIAC CATHETERIZATION N/A 01/09/2015   Procedure: Left Heart Cath and Coronary Angiography;  Surgeon: [name], MD;  Location: [place] CV LAB;  Service: Cardiovascular;  Laterality: N/A;  . ELECTROPHYSIOLOGIC STUDY N/A 08/16/2014   Procedure: CARDIOVERSION;  Surgeon: [name], MD;  Location: [facility] ORS;  Service: Cardiovascular;  Laterality: N/A;  . FLEXIBLE SIGMOIDOSCOPY N/A 10/10/2015   Procedure: FLEXIBLE SIGMOIDOSCOPY;  Surgeon: [name], MD;  Location: [facility] ENDOSCOPY;  Service: Endoscopy;  Laterality: N/A;  . KNEE SURGERY Right   . NM MYOVIEW LTD  November 2011   No ischemia or infarction. EF 50-55% ( no improvement from 2009 Myoview EF of 28%  . TEE WITHOUT CARDIOVERSION N/A 08/16/2014   Procedure: TRANSESOPHAGEAL ECHOCARDIOGRAM (TEE);  Surgeon: [name], MD;  Location: [facility] ORS;  Service: Cardiovascular;  Laterality: N/A;   Family History  Problem Relation Age of Onset  . Hypertension Mother   . Hyperlipidemia Mother   . Diabetes Mother    Social History  Substance  Use Topics  . Smoking status: Former Smoker    Packs/day: 1.00    Years: 12.00    Quit date: 02/23/1998  . Smokeless tobacco: Never Used  . Alcohol use No     Comment: 4 heavy alcohol drinker. Quit 2 years ago.   Allergies  Allergen Reactions  . Eggs Or Egg-Derived Products Rash   Prior to Admission medications   Medication Sig Start Date End Date Taking? Authorizing Provider  amiodarone (PACERONE) 200 MG tablet Take 1 tablet (200 mg total) by mouth daily. 05/22/15  Yes [name], MD  amLODipine (NORVASC) 10 MG  tablet Take 1 tablet (10 mg total) by mouth daily. 01/22/16 07/21/16 Yes Wende Bushy, MD  apixaban (ELIQUIS) 5 MG TABS tablet Take 1 tablet (5 mg total) by mouth 2 (two) times daily. 09/12/14  Yes Darylene Price A, FNP  atorvastatin (LIPITOR) 80 MG tablet Take 1 tablet (80 mg total) by mouth daily. 12/04/15  Yes Darylene Price A, FNP  carvedilol (COREG) 3.125 MG tablet Take 1 tablet (3.125 mg total) by mouth 2 (two) times daily with a meal. 10/10/15  Yes Sainani, Belia Heman, MD  furosemide (LASIX) 40 MG tablet Take 1 tablet (40 mg total) by mouth daily. 05/22/15  Yes [name], MD  levothyroxine (SYNTHROID, LEVOTHROID) 100 MCG tablet Take 100 mcg by mouth daily before breakfast.   Yes [provider]  sacubitril-valsartan (ENTRESTO) 24-26 MG Take 1 tablet by mouth 2 (two) times daily. 07/01/16  Yes Wende Bushy, MD     Review of Systems  Constitutional: Negative for appetite change and fatigue.  HENT: Negative for congestion, postnasal drip and sore throat.   Eyes: Negative.   Respiratory: Negative for chest tightness, shortness of breath and wheezing.   Cardiovascular: Negative for chest pain, palpitations and leg swelling.  Gastrointestinal: Negative for abdominal distention and abdominal pain.  Endocrine: Negative.   Genitourinary: Negative.   Musculoskeletal: Negative for back pain and neck pain.  Skin: Negative.   Allergic/Immunologic: Negative.   Neurological: Negative for dizziness and light-headedness.  Hematological: Negative for adenopathy. Does not bruise/bleed easily.  Psychiatric/Behavioral: Negative for dysphoric mood and sleep disturbance (wearing CPAP nightly). The patient is not nervous/anxious.    Vitals:   07/21/16 1029  BP: (!) 160/112  Pulse: 60  Resp: 20  SpO2: 98%  Weight: 299 lb (135.6 kg)  Height: 5\' 11"  (1.803 m)   Wt Readings from Last 3 Encounters:  07/21/16 299 lb (135.6 kg)  07/01/16 (!) 305 lb 12 oz (138.7 kg)  06/18/16 (!) 313 lb (142 kg)     Lab Results  Component Value Date   CREATININE 2.68 (H) 12/12/2015   CREATININE 2.41 (H) 10/10/2015   CREATININE 2.61 (H) 10/08/2015   Physical Exam  Constitutional: He is oriented to person, place, and time. He appears well-developed and well-nourished.  HENT:  Head: Normocephalic and atraumatic.  Neck: Normal range of motion. Neck supple. No JVD present.  Cardiovascular: Normal rate and regular rhythm.   Pulmonary/Chest: Effort normal. He has no wheezes. He has no rales.  Abdominal: Soft. He exhibits no distension. There is no tenderness.  Musculoskeletal: He exhibits no edema or tenderness.  Neurological: He is alert and oriented to person, place, and time.  Skin: Skin is warm and dry.  Psychiatric: He has a normal mood and affect. His behavior is normal. Thought content normal.  Nursing note and vitals reviewed.     Assessment & Plan:  1: Chronic heart failure  with preserved ejection fraction- - NYHA Class I - euvolemic today - weight down about 11 pounds from last visit here and about 14 pounds since March. Reminded to call for an overnight weight gain of >2 pounds or a weekly weight gain of >5 pounds - no adding salt. Reminded to keep salt under 2000 mg in a day - started Entresto on 07/01/16; no side effects at this time. - BMP ordered today  - saw cardiologist Yvone Neu) on 07/01/16; next visit on 07/30/16 - walking to work (1/4 mile one way) and eating less  2: HTN- - BP elevated today; 160/112 - BP at home is around 150's/110's; Reminded to continue to check BP at home - could increase Entresto or lasix pending lab results  - max dose amlodipine, low dose carvedilol due to HR, and furosemide 40 mg daily  3: Atrial fibrillation- - currently rate controlled  - taking amiodarone, apixaban and carvedilol  4: Obstructive sleep apnea- - sleeping well at night with CPAP machine - wears it 4-5 hours nightly   5. Hyperlipidemia-  - max dose atorvastatin  - lipid panel  performed 12/12/15  Patient did not bring his medications nor a list. Each medication was verbally reviewed with the patient and he was encouraged to bring the bottles to every visit to confirm accuracy of list.  Return here in 2 months or sooner for any questions/problems before then.

## 2016-07-21 NOTE — Telephone Encounter (Signed)
Attempted to contact patient again about the importance of getting his lab work drawn since entresto was resumed and that medication adjustments need to be made to aid in decreasing his blood pressure.   Have tried 3 times today and his phone just rings without an option to leave a message. Will have CMA send a letter to the patient emphasizing this.

## 2016-07-29 ENCOUNTER — Other Ambulatory Visit: Payer: Self-pay

## 2016-07-29 ENCOUNTER — Other Ambulatory Visit: Payer: Self-pay | Admitting: Pharmacist Clinician (PhC)/ Clinical Pharmacy Specialist

## 2016-07-29 MED ORDER — CARVEDILOL 3.125 MG PO TABS: 3.1250 mg | ORAL_TABLET | Freq: Two times a day (BID) | ORAL | 3 refills | Status: DC

## 2016-07-29 MED ORDER — FUROSEMIDE 40 MG PO TABS: 40.0000 mg | ORAL_TABLET | Freq: Every day | ORAL | 3 refills | Status: DC

## 2016-07-29 MED ORDER — SACUBITRIL-VALSARTAN 24-26 MG PO TABS: 1.0000 | ORAL_TABLET | Freq: Two times a day (BID) | ORAL | 3 refills | Status: DC

## 2016-07-29 MED ORDER — APIXABAN 5 MG PO TABS: 5.0000 mg | ORAL_TABLET | Freq: Two times a day (BID) | ORAL | 5 refills | Status: DC

## 2016-07-30 ENCOUNTER — Ambulatory Visit: Payer: Medicaid Other | Admitting: Physician Assistant

## 2016-08-13 ENCOUNTER — Encounter: Payer: Self-pay | Admitting: Physician Assistant

## 2016-08-13 ENCOUNTER — Ambulatory Visit: Payer: Medicaid Other | Admitting: Physician Assistant

## 2016-09-21 ENCOUNTER — Ambulatory Visit: Payer: Medicaid Other | Admitting: Family

## 2016-09-29 ENCOUNTER — Telehealth: Payer: Self-pay | Admitting: Cardiology

## 2016-09-29 NOTE — Telephone Encounter (Addendum)
°  Spoke with patient and scheduled next available fu with C. Sharolyn Douglas 10/14/16 .  Patient aware Endocrinology and nephrology offices will be calling patient to schedule appt.   Faxed referral information to [facility] Dr. Lucilla Lame and to [place] Dr. Lavonia Dana.      ----- Message from Valora Corporal, RN sent at 09/29/2016  8:45 AM EDT ----- Regarding: RE: follow up  If he has not seen anyone he could potentially go to Dr. Saunders Revel.  Thanks, Lesleigh Noe   ----- Message ----- From: Clarisse Gouge Sent: 09/24/2016   3:18 PM To: Valora Corporal, RN Subject: follow up                                      Patient has referral for Nephro and endocrinology in [place].  Patient was seen in may and never made it to his fu appt. (2-4 wk)   Patient cancelled 1 appt no show the other.    Contacted him to ask if he has been seen by recommended specialist. He has not .  Patient doesn't have a preference for who to see but  Cannot travel out of town .   Please advise.

## 2016-10-12 ENCOUNTER — Encounter: Payer: Self-pay | Admitting: Family

## 2016-10-12 ENCOUNTER — Ambulatory Visit: Payer: Medicaid Other | Attending: Family | Admitting: Family

## 2016-10-12 VITALS — BP 131/92 | HR 64 | Resp 18 | Ht 71.0 in | Wt 290.1 lb

## 2016-10-12 DIAGNOSIS — I5022 Chronic systolic (congestive) heart failure: Secondary | ICD-10-CM

## 2016-10-12 DIAGNOSIS — I4891 Unspecified atrial fibrillation: Secondary | ICD-10-CM | POA: Diagnosis not present

## 2016-10-12 DIAGNOSIS — I4892 Unspecified atrial flutter: Secondary | ICD-10-CM | POA: Insufficient documentation

## 2016-10-12 DIAGNOSIS — I252 Old myocardial infarction: Secondary | ICD-10-CM | POA: Insufficient documentation

## 2016-10-12 DIAGNOSIS — G4733 Obstructive sleep apnea (adult) (pediatric): Secondary | ICD-10-CM

## 2016-10-12 DIAGNOSIS — E785 Hyperlipidemia, unspecified: Secondary | ICD-10-CM | POA: Diagnosis not present

## 2016-10-12 DIAGNOSIS — I5042 Chronic combined systolic (congestive) and diastolic (congestive) heart failure: Secondary | ICD-10-CM | POA: Diagnosis not present

## 2016-10-12 DIAGNOSIS — I480 Paroxysmal atrial fibrillation: Secondary | ICD-10-CM

## 2016-10-12 DIAGNOSIS — I10 Essential (primary) hypertension: Secondary | ICD-10-CM

## 2016-10-12 DIAGNOSIS — I509 Heart failure, unspecified: Secondary | ICD-10-CM | POA: Diagnosis present

## 2016-10-12 DIAGNOSIS — I426 Alcoholic cardiomyopathy: Secondary | ICD-10-CM | POA: Diagnosis not present

## 2016-10-12 DIAGNOSIS — Z87891 Personal history of nicotine dependence: Secondary | ICD-10-CM | POA: Insufficient documentation

## 2016-10-12 DIAGNOSIS — N183 Chronic kidney disease, stage 3 (moderate): Secondary | ICD-10-CM | POA: Insufficient documentation

## 2016-10-12 DIAGNOSIS — I130 Hypertensive heart and chronic kidney disease with heart failure and stage 1 through stage 4 chronic kidney disease, or unspecified chronic kidney disease: Secondary | ICD-10-CM | POA: Insufficient documentation

## 2016-10-12 NOTE — Progress Notes (Signed)
[name], male    DOB: [birth year], 51 y.o.   MRN: [id number]  HPI  Hector [name] is a 51 y/o male with a history of MI, hyperlipidemia, HTN, CKD, atrial fibrillation, alcohol use, cardiomyopathy, and CHF.   Last echo was done on 06/02/16 which showed an EF of 30-35%. Prior echo on 02/21/15 which showed a normalization of his EF which was 50-55% with mild Hector. No longer ICD candidate. Prior echo on 03/07/14 showed an EF of 20-25% with mild/mod Hector. He was started on [facility] Feb 2016. Last left heart cath was done 01/09/15.   Last seen in the ED on 06/18/16 for knee pain. He was treated and discharged from the ED. Was last admitted on 10/08/15 with a GI bleed. GI was consulted and a flex sigmoidoscopy was done which showed hemorrhoids.   He presents today for a follow-up visit with a chief complaint of minimal fatigue upon moderate exertion. He describes this as being chronic in nature having been present for several years with varying levels of intensity. He has no associated symptoms. He denies any shortness of breath, chest pain, edema, dizziness or weight gain.   Past Medical History:  Diagnosis Date  . Alcohol abuse   . 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 No inducible ischemia, fixed inferoapical defect = Infarct vs.artifact (not commensurate with extent of reduced EF); b) Echo 8/'12: Mild LV dilation, Mod-Severe global hypokinesis - EF 25-35%. Mild-moderate RV dilation no significant bowel lesion mild-moderate Hector; c) Echo Jan 2016: EF 20-25%, high LAP, mild-moderate LV dilation, severe L  . Atrial fibrillation ([place])    a. new onset s/p unsuccessful TEE/DCCV on 08/16/2014; b. on apixaban   . Atrial flutter ([place])    a. new onset 07/2014; b. s/p unsuccessful TEE/DCCV 08/16/2014; c. on apixaban   . CHF (congestive heart failure) ([place])   . Chronic combined systolic and diastolic heart failure, NYHA class 2 ([place])     a. see above  . CKD (chronic kidney disease), stage III   . Elevated troponin    Chronically elevated. - level was 0.17-0.10 during recent admission  . Essential hypertension   . GI bleed 11/2013  . Hyperlipidemia   . Myocardial infarct ([place]) 2004  . Pancreatitis   . Sleep-disordered breathing    Has yet to have a sleep study   Past Surgical History:  Procedure Laterality Date  . CARDIAC CATHETERIZATION N/A 01/09/2015   Procedure: Left Heart Cath and Coronary Angiography;  Surgeon: [name], MD;  Location: [place] CV LAB;  Service: Cardiovascular;  Laterality: N/A;  . ELECTROPHYSIOLOGIC STUDY N/A 08/16/2014   Procedure: CARDIOVERSION;  Surgeon: [name], MD;  Location: [facility] ORS;  Service: Cardiovascular;  Laterality: N/A;  . FLEXIBLE SIGMOIDOSCOPY N/A 10/10/2015   Procedure: FLEXIBLE SIGMOIDOSCOPY;  Surgeon: [name], MD;  Location: [facility] ENDOSCOPY;  Service: Endoscopy;  Laterality: N/A;  . KNEE SURGERY Right   . NM MYOVIEW LTD  November 2011   No ischemia or infarction. EF 50-55% ( no improvement from 2009 Myoview EF of 28%  . TEE WITHOUT CARDIOVERSION N/A 08/16/2014   Procedure: TRANSESOPHAGEAL ECHOCARDIOGRAM (TEE);  Surgeon: [name], MD;  Location: [facility] ORS;  Service: Cardiovascular;  Laterality: N/A;   Family History  Problem Relation Age of Onset  . Hypertension Mother   . Hyperlipidemia Mother   . Diabetes Mother    Social History  Substance Use  Topics  . Smoking status: Former Smoker    Packs/day: 1.00    Years: 12.00    Quit date: 02/23/1998  . Smokeless tobacco: Never Used  . Alcohol use No     Comment: 4 heavy alcohol drinker. Quit 2 years ago.   Allergies  Allergen Reactions  . Eggs Or Egg-Derived Products Rash   Prior to Admission medications   Medication Sig Start Date End Date Taking? Authorizing Provider  amiodarone (PACERONE) 200 MG tablet Take 1 tablet (200 mg total) by mouth daily. 05/22/15  Yes [name], MD   amLODipine (NORVASC) 10 MG tablet Take 1 tablet (10 mg total) by mouth daily. 01/22/16 07/21/16 Yes Wende Bushy, MD  apixaban (ELIQUIS) 5 MG TABS tablet Take 1 tablet (5 mg total) by mouth 2 (two) times daily. 09/12/14  Yes Darylene Price A, FNP  atorvastatin (LIPITOR) 80 MG tablet Take 1 tablet (80 mg total) by mouth daily. 12/04/15  Yes Darylene Price A, FNP  carvedilol (COREG) 3.125 MG tablet Take 1 tablet (3.125 mg total) by mouth 2 (two) times daily with a meal. 10/10/15  Yes Sainani, Belia Heman, MD  furosemide (LASIX) 40 MG tablet Take 1 tablet (40 mg total) by mouth daily. 05/22/15  Yes [name], MD  levothyroxine (SYNTHROID, LEVOTHROID) 100 MCG tablet Take 100 mcg by mouth daily before breakfast.   Yes [provider]  sacubitril-valsartan (ENTRESTO) 24-26 MG Take 1 tablet by mouth 2 (two) times daily. 07/01/16  Yes Wende Bushy, MD    Review of Systems  Constitutional: Positive for fatigue (minimal). Negative for appetite change.  HENT: Negative for congestion, postnasal drip and sore throat.   Eyes: Negative.   Respiratory: Negative for chest tightness, shortness of breath and wheezing.   Cardiovascular: Negative for chest pain, palpitations and leg swelling.  Gastrointestinal: Negative for abdominal distention and abdominal pain.  Endocrine: Negative.   Genitourinary: Negative.   Musculoskeletal: Negative for back pain and neck pain.  Skin: Negative.   Allergic/Immunologic: Negative.   Neurological: Negative for dizziness and light-headedness.  Hematological: Negative for adenopathy. Does not bruise/bleed easily.  Psychiatric/Behavioral: Negative for dysphoric mood and sleep disturbance (wearing CPAP nightly). The patient is not nervous/anxious.    Vitals:   10/12/16 0932  BP: (!) 131/92  Pulse: 64  Resp: 18  SpO2: 95%  Weight: 290 lb 2 oz (131.6 kg)  Height: 5\' 11"  (1.803 m)   Wt Readings from Last 3 Encounters:  10/12/16 290 lb 2 oz (131.6 kg)  07/21/16 299  lb (135.6 kg)  07/01/16 (!) 305 lb 12 oz (138.7 kg)    Lab Results  Component Value Date   CREATININE 2.68 (H) 12/12/2015   CREATININE 2.41 (H) 10/10/2015   CREATININE 2.61 (H) 10/08/2015   Physical Exam  Constitutional: He is oriented to person, place, and time. He appears well-developed and well-nourished.  HENT:  Head: Normocephalic and atraumatic.  Neck: Normal range of motion. Neck supple. No JVD present.  Cardiovascular: Normal rate and regular rhythm.   Pulmonary/Chest: Effort normal. He has no wheezes. He has no rales.  Abdominal: Soft. He exhibits no distension. There is no tenderness.  Musculoskeletal: He exhibits no edema or tenderness.  Neurological: He is alert and oriented to person, place, and time.  Skin: Skin is warm and dry.  Psychiatric: He has a normal mood and affect. His behavior is normal. Thought content normal.  Nursing note and vitals reviewed.     Assessment & Plan:  1:  Chronic heart failure with reduced ejection fraction- - NYHA Class II - euvolemic today - weight down 9 pounds from last visit here. Reminded to call for an overnight weight gain of >2 pounds or a weekly weight gain of >5 pounds - not adding salt. Reminded to keep salt under 2000 mg in a day - tolerating entresto without known side effects - did not get BMP drawn last time because of wait time in lab and is unable to get it done today - placed a BMP for a future order as he says that he can get it done within the next week - saw cardiologist (Ingal) on 07/01/16; next visit on 11/03/16  - walking to work (1/4 mile one way) and eating less  2: HTN- - BP better today - could increase Entresto at future visits - max dose amlodipine, low dose carvedilol due to HR, and furosemide 40 mg daily - sees nephrologist [facility]) November 2018  3: Atrial fibrillation- - currently rate controlled  - taking amiodarone, apixaban and carvedilol  4: Obstructive sleep apnea- - sleeping well at night  with CPAP machine - wears it 4-5 hours nightly    Patient did not bring his medications nor a list. Each medication was verbally reviewed with the patient and he was encouraged to bring the bottles to every visit to confirm accuracy of list.  Return here in 2 months or sooner for any questions/problems before then.

## 2016-10-12 NOTE — Patient Instructions (Signed)
Continue weighing daily and call for an overnight weight gain of > 2 pounds or a weekly weight gain of >5 pounds. 

## 2016-10-14 ENCOUNTER — Ambulatory Visit: Payer: Medicaid Other | Admitting: Nurse Practitioner

## 2016-10-16 ENCOUNTER — Telehealth: Payer: Self-pay | Admitting: Family

## 2016-10-16 ENCOUNTER — Other Ambulatory Visit
Admission: RE | Admit: 2016-10-16 | Discharge: 2016-10-16 | Disposition: A | Discharge status: Home / Self Care | Payer: Medicaid Other | Source: Ambulatory Visit | Attending: Family | Admitting: Family

## 2016-10-16 DIAGNOSIS — I5022 Chronic systolic (congestive) heart failure: Secondary | ICD-10-CM | POA: Insufficient documentation

## 2016-10-16 LAB — BASIC METABOLIC PANEL
Anion gap: 9 (ref 5–15)
BUN: 30 mg/dL — AB (ref 6–20)
CHLORIDE: 106 mmol/L (ref 101–111)
CO2: 25 mmol/L (ref 22–32)
CREATININE: 1.95 mg/dL — AB (ref 0.61–1.24)
Calcium: 8.8 mg/dL — ABNORMAL LOW (ref 8.9–10.3)
GFR calc Af Amer: 44 mL/min — ABNORMAL LOW (ref >60)
GFR calc non Af Amer: 38 mL/min — ABNORMAL LOW (ref >60)
Glucose, Bld: 124 mg/dL — ABNORMAL HIGH (ref 65–99)
Potassium: 3.9 mmol/L (ref 3.5–5.1)
SODIUM: 140 mmol/L (ref 135–145)

## 2016-10-16 NOTE — Telephone Encounter (Signed)
Spoke with patient regarding lab work that was drawn today since entresto was started. Sodium 140, potassium 3.9 and GFR 44. Renal function has improved some since it was last checked June 2018 when GFR was 37.

## 2016-11-03 ENCOUNTER — Ambulatory Visit (INDEPENDENT_AMBULATORY_CARE_PROVIDER_SITE_OTHER): Payer: Medicaid Other | Admitting: Nurse Practitioner

## 2016-11-03 ENCOUNTER — Other Ambulatory Visit: Payer: Self-pay

## 2016-11-03 ENCOUNTER — Encounter: Payer: Self-pay | Admitting: Nurse Practitioner

## 2016-11-03 VITALS — BP 122/90 | HR 98 | Ht 69.0 in | Wt 289.2 lb

## 2016-11-03 DIAGNOSIS — I480 Paroxysmal atrial fibrillation: Secondary | ICD-10-CM

## 2016-11-03 DIAGNOSIS — I5022 Chronic systolic (congestive) heart failure: Secondary | ICD-10-CM | POA: Diagnosis not present

## 2016-11-03 DIAGNOSIS — I428 Other cardiomyopathies: Secondary | ICD-10-CM | POA: Diagnosis not present

## 2016-11-03 DIAGNOSIS — E039 Hypothyroidism, unspecified: Secondary | ICD-10-CM | POA: Diagnosis not present

## 2016-11-03 DIAGNOSIS — I10 Essential (primary) hypertension: Secondary | ICD-10-CM

## 2016-11-03 MED ORDER — SACUBITRIL-VALSARTAN 49-51 MG PO TABS: 1.0000 | ORAL_TABLET | Freq: Two times a day (BID) | ORAL | 6 refills | Status: DC

## 2016-11-03 NOTE — Progress Notes (Signed)
Repeated messages that rx will not go through.  Multiple attempts to call rx in, but line is busy. Routed to fax # [phone].

## 2016-11-03 NOTE — Progress Notes (Signed)
Attempted to call rx into [facility], but line is busy x 3 attempts. Resubmitted electronically.

## 2016-11-03 NOTE — Progress Notes (Signed)
Office Visit    Patient Name: Hector [name] Date of Encounter: 11/03/2016  Primary Care Provider:  [name], MD Primary Cardiologist:  Formerly D. Ellyn Hack, MD / A. Yvone Neu, MD ; EPOlin Pia, MD   Chief Complaint    51 year old male with a prior history of nonischemic cardiomyopathy, chronic combined heart failure, nonobstructive CAD, paroxysmal atrial fibrillation and flutter, hypertension, obesity, and alcohol abuse, who presents for follow-up today.  Past Medical History    Past Medical History:  Diagnosis Date  . Alcohol abuse   . 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 12/2007 MV: EF 28%, no isch;  b. 8/12 Echo: EF 25-35%; c. 02/2014 Echo: EF 20-25%; d. 12/2014 Cath: minimal CAD; e. 01/2015 Echo: EF 50-55%;  d. 05/2016 Echo: EF 30-35%, diff HK, gr2 DD.  Marland Kitchen Chronic combined systolic (congestive) and diastolic (congestive) heart failure ([place])    a. 05/2016 Most recent Echo: EF 30-35%, diff HK, Gr2 DD, mild MR, mod dil LA.  Marland Kitchen Chronic combined systolic and diastolic heart failure, NYHA class 2 ([place])    a. see above  . CKD (chronic kidney disease), stage III   . Elevated troponin    Chronically elevated. - level was 0.17-0.10 during recent admission  . Essential hypertension   . GI bleed 11/2013  . Hyperlipidemia   . Pancreatitis   . Paroxysmal A-fib ([place])    a. new onset s/p unsuccessful TEE/DCCV on 08/16/2014; b. on amio/eliquis (CHA2DS2VASc = 2-3); c. reports 1-2 hrs of afib ~ q25mos.  . Paroxysmal atrial flutter ([place])    a. new onset 07/2014; b. s/p unsuccessful TEE/DCCV 08/16/2014; c. on apixaban.  . Sleep-disordered breathing    Has yet to have a sleep study   Past Surgical History:  Procedure Laterality Date  . CARDIAC CATHETERIZATION N/A 01/09/2015   Procedure: Left Heart Cath and Coronary Angiography;  Surgeon: [name], MD;  Location: [place] [facility];  Service: Cardiovascular;  Laterality: N/A;  . ELECTROPHYSIOLOGIC STUDY N/A 08/16/2014   Procedure: CARDIOVERSION;  Surgeon: [name], MD;  Location: [facility];  Service: Cardiovascular;  Laterality: N/A;  . FLEXIBLE SIGMOIDOSCOPY N/A 10/10/2015   Procedure: FLEXIBLE SIGMOIDOSCOPY;  Surgeon: [name], MD;  Location: [facility];  Service: Endoscopy;  Laterality: N/A;  . KNEE SURGERY Right   . NM MYOVIEW LTD  November 2011   No ischemia or infarction. EF 50-55% ( no improvement from 2009 Myoview EF of 28%  . TEE WITHOUT CARDIOVERSION N/A 08/16/2014   Procedure: TRANSESOPHAGEAL ECHOCARDIOGRAM (TEE);  Surgeon: [name], MD;  Location: [facility] OR[facility];  Service: Cardiovascular;  Laterality: N/A;    Allergies  Allergies  Allergen Reactions  . Eggs Or Egg-Derived Products Rash    History of Present Illness    51 year old male with the above complex past medical history including alcoholic/nonischemic cardiomyopathy dating back to 2009, hypertension, paroxysmal atrial fibrillation and flutter, chronic combined heart failure, sleep apnea, hypertension, obesity, stage III chronic kidney disease, hyperlipidemia, and alcohol abuse. He was previously followed by Dr. Ellyn Hack and most recently Dr. Yvone Neu.  As noted, cardiomyopathy was diagnosed in 2009. Stress testing was undertaken at that time did not show any ischemia. 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 He did quit drinking and EF has varied over the years. In 2016, he was diagnosed with atrial flutter and was cardioverted in June 2016. He subsequently reverted to atrial fibrillation and was placed on amiodarone and eliquis. Since then,  he notes that he has an episode of palpitations occurring about every 3 months, and lasting about 2 hours. He is generally not all that symptomatic other than noting palpitations.  In late 2016, after being seen by electrophysiology due to ongoing low LV function, he underwent diagnostic catheterization which showed nonobstructive CAD. Follow-up echocardiogram in December 2016  showed recovery of LV function and therefore ICD therapy was not indicated.  A follow-up echo was performed in April 2018 and showed recurrent LV dysfunction with an EF of 30-35% and diffuse hypokinesis. He was seen in clinic and was placed back on Entresto. He says that since then, he has been doing well. He denies chest pain, dyspnea, PND, orthopnea, dizziness, syncope, edema, or early satiety. He is due to have a follow-up echo to reevaluate LV function and determine whether or not he needs EP referral.    Home Medications    Prior to Admission medications   Medication Sig Start Date End Date Taking? Authorizing Provider  amiodarone (PACERONE) 200 MG tablet Take 1 tablet (200 mg total) by mouth daily. 05/22/15  Yes [name], MD  amLODipine (NORVASC) 10 MG tablet Take 1 tablet (10 mg total) by mouth daily. 01/22/16 11/03/16 Yes Wende Bushy, MD  apixaban (ELIQUIS) 5 MG TABS tablet Take 1 tablet (5 mg total) by mouth 2 (two) times daily. 07/29/16  Yes [name], MD  atorvastatin (LIPITOR) 80 MG tablet Take 1 tablet (80 mg total) by mouth daily. 12/04/15  Yes Alisa Graff, FNP  carvedilol (COREG) 3.125 MG tablet Take 1 tablet (3.125 mg total) by mouth 2 (two) times daily with a meal. 07/29/16  Yes [place], Peter M, MD  furosemide (LASIX) 40 MG tablet Take 1 tablet (40 mg total) by mouth daily. 07/29/16  Yes [place], Peter M, MD  levothyroxine (SYNTHROID, LEVOTHROID) 100 MCG tablet Take 100 mcg by mouth daily before breakfast.   Yes [provider]  sacubitril-valsartan (ENTRESTO) 49-51 MG Take 1 tablet by mouth 2 (two) times daily. 11/03/16   Rogelia Mire, NP    Review of Systems    As above, he has been doing well.  He denies chest pain, palpitations, dyspnea, pnd, orthopnea, n, v, dizziness, syncope, edema, weight gain, or early satiety.  All other systems reviewed and are otherwise negative except as noted above.  Physical Exam    VS:  BP 122/90 (BP Location: Left  Arm, Patient Position: Sitting, Cuff Size: Normal)   Pulse 98   Ht 5\' 9"  (1.753 m)   Wt 289 lb 4 oz (131.2 kg)   BMI 42.71 kg/m  , BMI Body mass index is 42.71 kg/m. GEN: Obese, no acute distress.  HEENT: normal.  Neck: Supple, no JVD, carotid bruits, or masses. Cardiac: Irregularly, irregular, no murmurs, rubs, or gallops. No clubbing, cyanosis, edema.  Radials/DP/PT 2+ and equal bilaterally.  Respiratory:  Respirations regular and unlabored, clear to auscultation bilaterally. GI: Soft, nontender, nondistended, BS + x 4. MS: no deformity or atrophy. Skin: warm and dry, no rash. Neuro:  Strength and sensation are intact. Psych: Normal affect.  Accessory Clinical Findings    ECG - Atrial fibrillation, 98, left axis deviation, lateral T-wave inversion. Other than change in rhythm, no acute changes.  Assessment & Plan    1.  Nonischemic cardiomyopathy/chronic combined systolic and diastolic congestive heart failure: Repeat echo in April 2018 showed recurrent LV dysfunction with an EF of 30-35%. At the time, patient was not on Entresto. That was  resumed with a plan to follow-up echo in 3 months. Patient has been doing well at home and his weight has been stable. He does not have any significant volume overload on exam today. I will advance his Entresto today. Continue beta blocker and Lasix at current doses. I will schedule a repeat echo and subsequent office follow-up. Plan EP referral if EF remains depressed.  We discussed the importance of daily weights, sodium restriction, medication compliance, and symptom reporting and he verbalizes understanding. I will plan a follow-up basic metabolic panel and blood pressure check in one week, at which point we can consider titrating Entresto further.   2. Paroxysmal atrial fibrillation/flutter: Patient is back in atrial fibrillation today. He says that this happens about once every 2-3 months and usually lasts an hour or 2. He has been compliant with  his amiodarone, eliquis, and beta blocker. He is not symptomatic this morning. I asked him to call us this week if A. fib persists, as we may need to adjust amiodarone and/or pursue cardioversion if A. fib becomes persistent. I will follow-up and ECG when he comes in for blood pressure check next week.  3. Essential hypertension: Diastolic is elevated today. Titrating Entresto as above.  4. Hypothyroidism: TSH was checked by primary care in April and was found to be markedly elevated at 54.75 at the time. His levothyroxine was increased to 100 g daily at that time. He says he has not had blood work since. I will follow-up a TSH.  5. Hyperlipidemia: He remains on statin therapy.  6. Disposition: Follow-up blood pressure check, basic metabolic panel, TSH, and ECG next week. I will arrange for follow-up echocardiogram to reevaluate LV function and determine need for EP referral.   Murray Hodgkins, NP 11/03/2016, 11:27 AM

## 2016-11-03 NOTE — Patient Instructions (Signed)
Medication Instructions:  Please increase your Entresto to 49/51 mg twice daily  Labwork: None today  Testing/Procedures: None today  Follow-Up: Nurse visit in 1 week for BP check, EKG & labs (BMET)  With Ignacia Bayley, NP, Christell Faith, PA or Dr. Rockey Situ in 3 months  If you need a refill on your cardiac medications before your next appointment, please call your pharmacy.

## 2016-11-04 ENCOUNTER — Other Ambulatory Visit: Payer: Self-pay

## 2016-11-04 ENCOUNTER — Telehealth: Payer: Self-pay | Admitting: Physician Assistant

## 2016-11-04 DIAGNOSIS — I5022 Chronic systolic (congestive) heart failure: Secondary | ICD-10-CM

## 2016-11-04 DIAGNOSIS — I428 Other cardiomyopathies: Secondary | ICD-10-CM

## 2016-11-04 DIAGNOSIS — I10 Essential (primary) hypertension: Secondary | ICD-10-CM

## 2016-11-04 DIAGNOSIS — I480 Paroxysmal atrial fibrillation: Secondary | ICD-10-CM

## 2016-11-04 MED ORDER — SACUBITRIL-VALSARTAN 49-51 MG PO TABS: 1.0000 | ORAL_TABLET | Freq: Two times a day (BID) | ORAL | 6 refills | Status: DC

## 2016-11-04 NOTE — Telephone Encounter (Signed)
Referral was placed for this patient to follow up with [facility] department of Endocrinology. They sent [place] a letter stating that he has not responded to calls/no showed/or canceled three times. He will need new referral when he agrees to that plan of care.

## 2016-11-04 NOTE — Telephone Encounter (Signed)
Requested Prescriptions   Signed Prescriptions Disp Refills  . sacubitril-valsartan (ENTRESTO) 49-51 MG 60 tablet 6    Sig: Take 1 tablet by mouth 2 (two) times daily.    Authorizing Provider: Rogelia Mire    Ordering User: [name]

## 2016-11-04 NOTE — Telephone Encounter (Signed)
I have not seen this patient since a round in the hospital in 2016. Looks like Christ saw him yesterday.

## 2016-11-11 ENCOUNTER — Ambulatory Visit (INDEPENDENT_AMBULATORY_CARE_PROVIDER_SITE_OTHER): Payer: Medicaid Other

## 2016-11-11 ENCOUNTER — Other Ambulatory Visit (INDEPENDENT_AMBULATORY_CARE_PROVIDER_SITE_OTHER): Payer: Medicaid Other | Admitting: *Deleted

## 2016-11-11 VITALS — BP 112/70 | HR 68 | Resp 16

## 2016-11-11 DIAGNOSIS — I480 Paroxysmal atrial fibrillation: Secondary | ICD-10-CM

## 2016-11-11 DIAGNOSIS — I5022 Chronic systolic (congestive) heart failure: Secondary | ICD-10-CM | POA: Diagnosis not present

## 2016-11-11 DIAGNOSIS — I428 Other cardiomyopathies: Secondary | ICD-10-CM

## 2016-11-11 DIAGNOSIS — I10 Essential (primary) hypertension: Secondary | ICD-10-CM

## 2016-11-11 NOTE — Patient Instructions (Signed)
1.) Reason for visit: BP check and EKG  2.) Name of MD requesting visit: Ignacia Bayley, NP  3.) H&P: Hx of paroxysmal afib/aflutter, chronic systolic HF, HTN  4.) ROS related to problem: Pt in afib at Sept 11 OV. Takes amiodarone 200mg  qd. Reports feeling well. EKG today to determine if medication adjustment or DCCV is needed. BP check as Delene Loll was increased to 49-51mg  on Sept 11.    5.) Assessment and plan per MD: EKG shows NSR. Reviewed by Dr. Saunders Revel; scanned into chart. Pt will await lab results and recommendations regarding increasing Entresto.

## 2016-11-12 ENCOUNTER — Telehealth: Payer: Self-pay | Admitting: Nurse Practitioner

## 2016-11-12 NOTE — Telephone Encounter (Signed)
BP from nurse visit reviewed by Ignacia Bayley, NP, who instructs to continue Entresto 49-51mg . Reviewed with pt who verbalized understanding.

## 2016-11-13 LAB — BASIC METABOLIC PANEL
BUN/Creatinine Ratio: 8 — ABNORMAL LOW (ref 9–20)
BUN: 16 mg/dL (ref 6–24)
CALCIUM: 8.8 mg/dL (ref 8.7–10.2)
CO2: 17 mmol/L — ABNORMAL LOW (ref 20–29)
CREATININE: 2.03 mg/dL — AB (ref 0.76–1.27)
Chloride: 105 mmol/L (ref 96–106)
GFR, EST AFRICAN AMERICAN: 42 mL/min/{1.73_m2} — AB (ref >59)
GFR, EST NON AFRICAN AMERICAN: 37 mL/min/{1.73_m2} — AB (ref >59)
Glucose: 129 mg/dL — ABNORMAL HIGH (ref 65–99)
Potassium: 4.2 mmol/L (ref 3.5–5.2)
Sodium: 144 mmol/L (ref 134–144)

## 2016-11-13 LAB — TSH: TSH: 19.06 u[IU]/mL — ABNORMAL HIGH (ref 0.450–4.500)

## 2016-11-15 ENCOUNTER — Emergency Department
Admission: EM | Admit: 2016-11-15 | Discharge: 2016-11-15 | Disposition: A | Discharge status: Home / Self Care | Payer: Medicaid Other | Attending: Emergency Medicine | Admitting: Emergency Medicine

## 2016-11-15 ENCOUNTER — Emergency Department: Payer: Medicaid Other

## 2016-11-15 DIAGNOSIS — M109 Gout, unspecified: Secondary | ICD-10-CM | POA: Insufficient documentation

## 2016-11-15 DIAGNOSIS — I480 Paroxysmal atrial fibrillation: Secondary | ICD-10-CM | POA: Insufficient documentation

## 2016-11-15 DIAGNOSIS — Z7901 Long term (current) use of anticoagulants: Secondary | ICD-10-CM | POA: Insufficient documentation

## 2016-11-15 DIAGNOSIS — I5042 Chronic combined systolic (congestive) and diastolic (congestive) heart failure: Secondary | ICD-10-CM | POA: Insufficient documentation

## 2016-11-15 DIAGNOSIS — M79641 Pain in right hand: Secondary | ICD-10-CM | POA: Insufficient documentation

## 2016-11-15 DIAGNOSIS — N183 Chronic kidney disease, stage 3 (moderate): Secondary | ICD-10-CM | POA: Insufficient documentation

## 2016-11-15 DIAGNOSIS — Z87891 Personal history of nicotine dependence: Secondary | ICD-10-CM | POA: Insufficient documentation

## 2016-11-15 DIAGNOSIS — I130 Hypertensive heart and chronic kidney disease with heart failure and stage 1 through stage 4 chronic kidney disease, or unspecified chronic kidney disease: Secondary | ICD-10-CM | POA: Insufficient documentation

## 2016-11-15 DIAGNOSIS — Z79899 Other long term (current) drug therapy: Secondary | ICD-10-CM | POA: Diagnosis not present

## 2016-11-15 DIAGNOSIS — M25521 Pain in right elbow: Secondary | ICD-10-CM | POA: Diagnosis present

## 2016-11-15 DIAGNOSIS — M79601 Pain in right arm: Secondary | ICD-10-CM

## 2016-11-15 LAB — CBC WITH DIFFERENTIAL/PLATELET
BASOS ABS: 0.1 10*3/uL (ref 0–0.1)
BASOS PCT: 1 %
EOS ABS: 0.1 10*3/uL (ref 0–0.7)
Eosinophils Relative: 1 %
HEMATOCRIT: 44.6 % (ref 40.0–52.0)
Hemoglobin: 14.7 g/dL (ref 13.0–18.0)
Lymphocytes Relative: 13 %
Lymphs Abs: 1.5 10*3/uL (ref 1.0–3.6)
MCH: 28.1 pg (ref 26.0–34.0)
MCHC: 32.9 g/dL (ref 32.0–36.0)
MCV: 85.3 fL (ref 80.0–100.0)
Monocytes Absolute: 0.9 10*3/uL (ref 0.2–1.0)
Monocytes Relative: 8 %
NEUTROS ABS: 8.7 10*3/uL — AB (ref 1.4–6.5)
NEUTROS PCT: 77 %
Platelets: 203 10*3/uL (ref 150–440)
RBC: 5.22 MIL/uL (ref 4.40–5.90)
RDW: 15.8 % — ABNORMAL HIGH (ref 11.5–14.5)
WBC: 11.3 10*3/uL — AB (ref 3.8–10.6)

## 2016-11-15 LAB — BASIC METABOLIC PANEL
ANION GAP: 11 (ref 5–15)
BUN: 30 mg/dL — ABNORMAL HIGH (ref 6–20)
CALCIUM: 8.8 mg/dL — AB (ref 8.9–10.3)
CHLORIDE: 102 mmol/L (ref 101–111)
CO2: 23 mmol/L (ref 22–32)
CREATININE: 2.19 mg/dL — AB (ref 0.61–1.24)
GFR calc Af Amer: 38 mL/min — ABNORMAL LOW (ref >60)
GFR calc non Af Amer: 33 mL/min — ABNORMAL LOW (ref >60)
Glucose, Bld: 150 mg/dL — ABNORMAL HIGH (ref 65–99)
Potassium: 4 mmol/L (ref 3.5–5.1)
SODIUM: 136 mmol/L (ref 135–145)

## 2016-11-15 LAB — URIC ACID: URIC ACID, SERUM: 11 mg/dL — AB (ref 4.4–7.6)

## 2016-11-15 IMAGING — DX DG ELBOW COMPLETE 3+V*R*
4 series · 4 of 4 positions shown · non-contrast
Comparison: None.

CLINICAL DATA: Pain and swelling in the right elbow for 3 days. No
injury. History of gout.

EXAM:
RIGHT ELBOW - COMPLETE 3+ VIEW

[elbow ap]
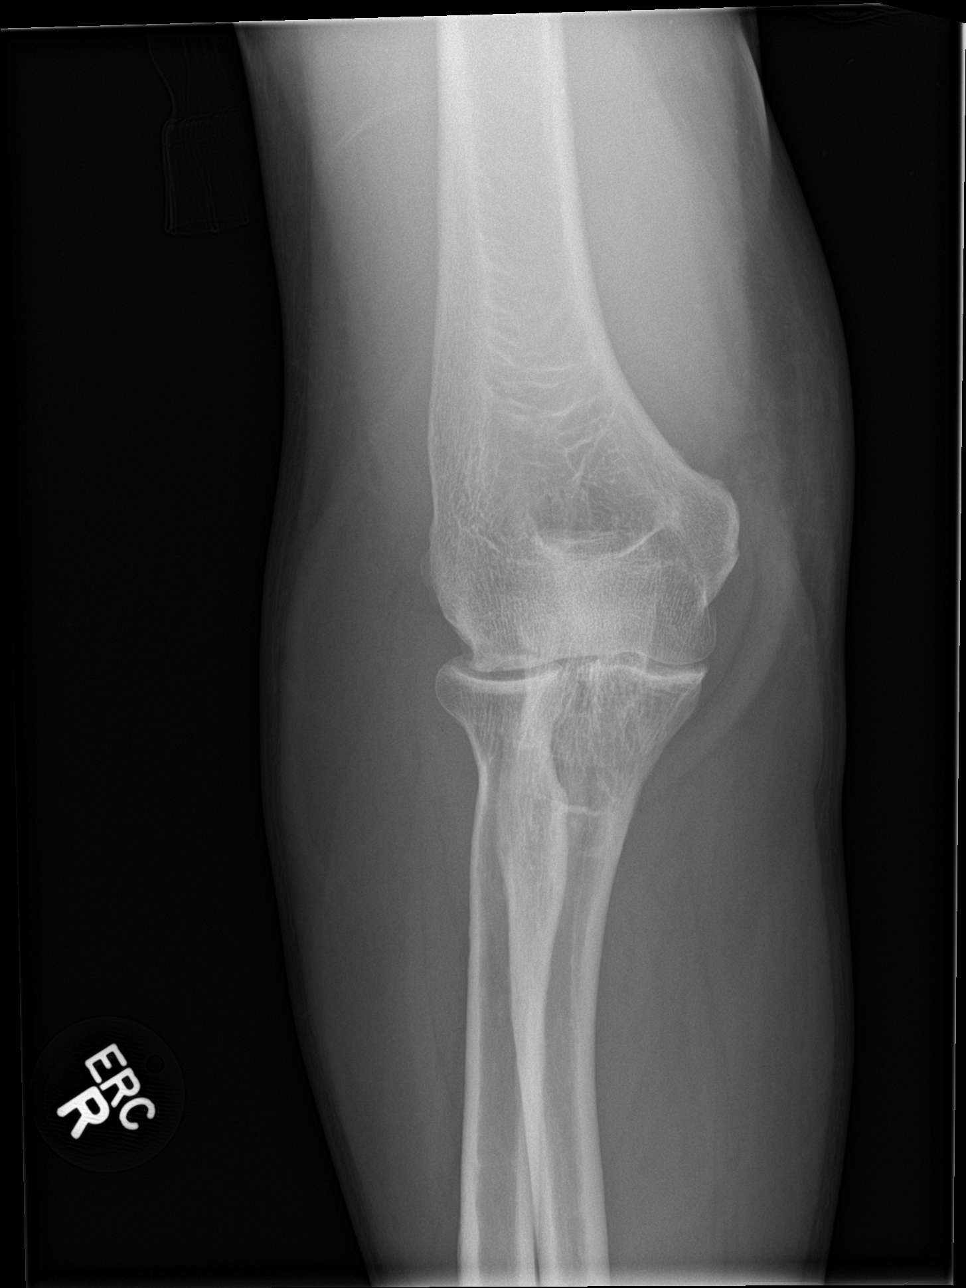

[elbow obl (1 of 2)]
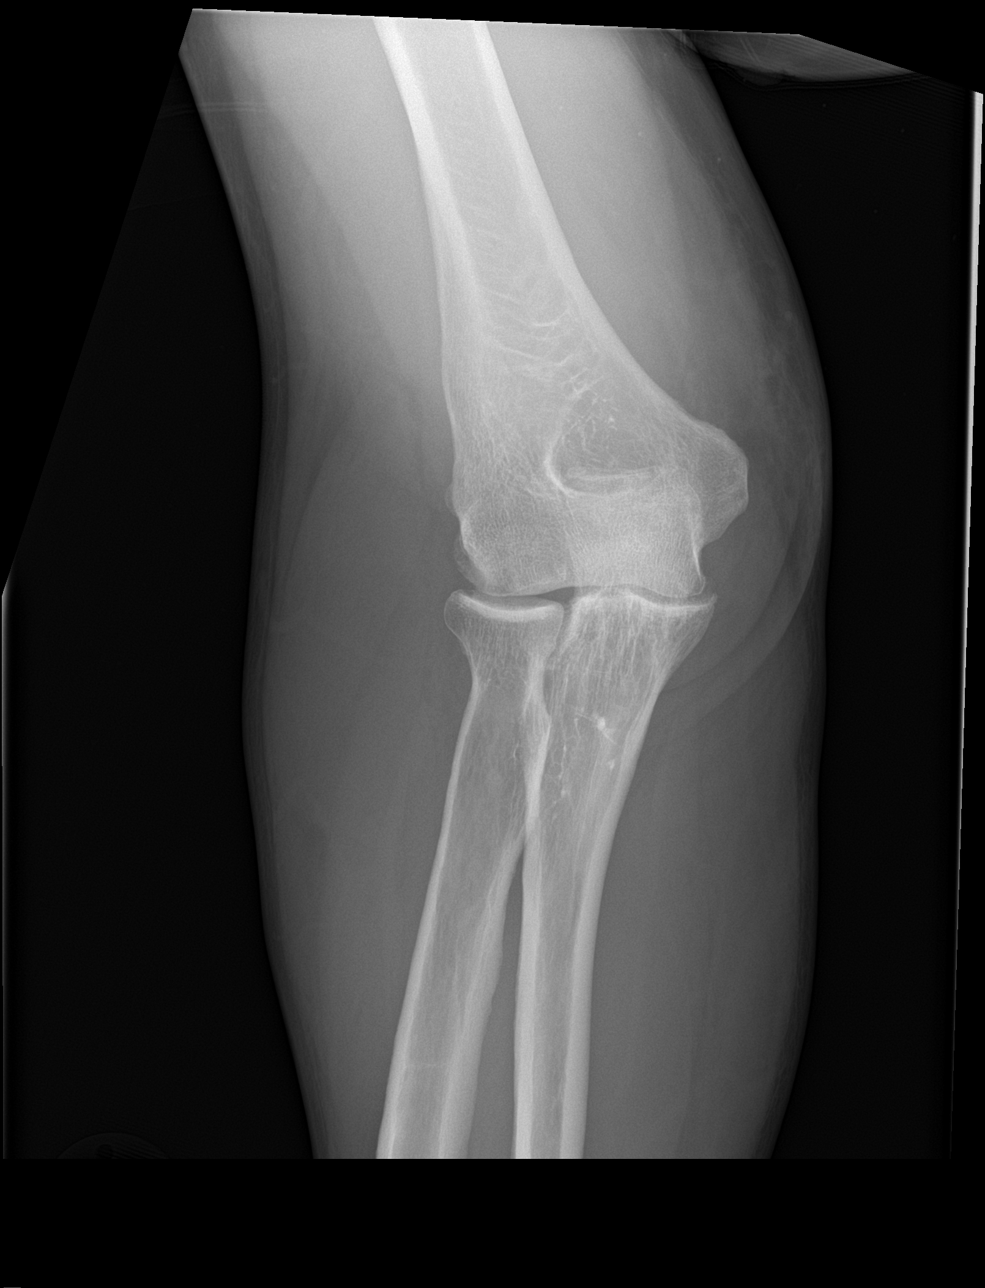

[elbow obl (2 of 2)]
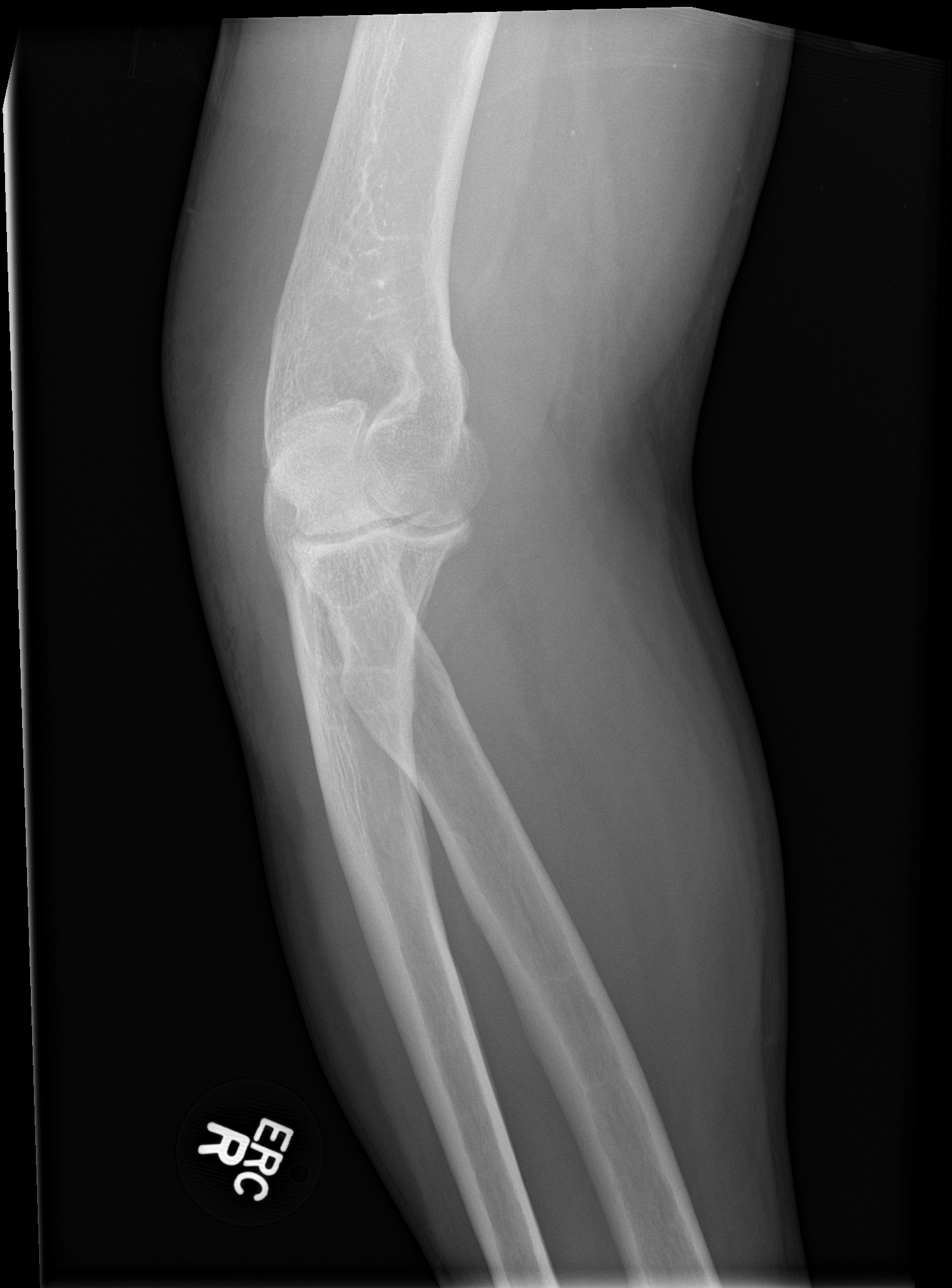

[elbow lat]
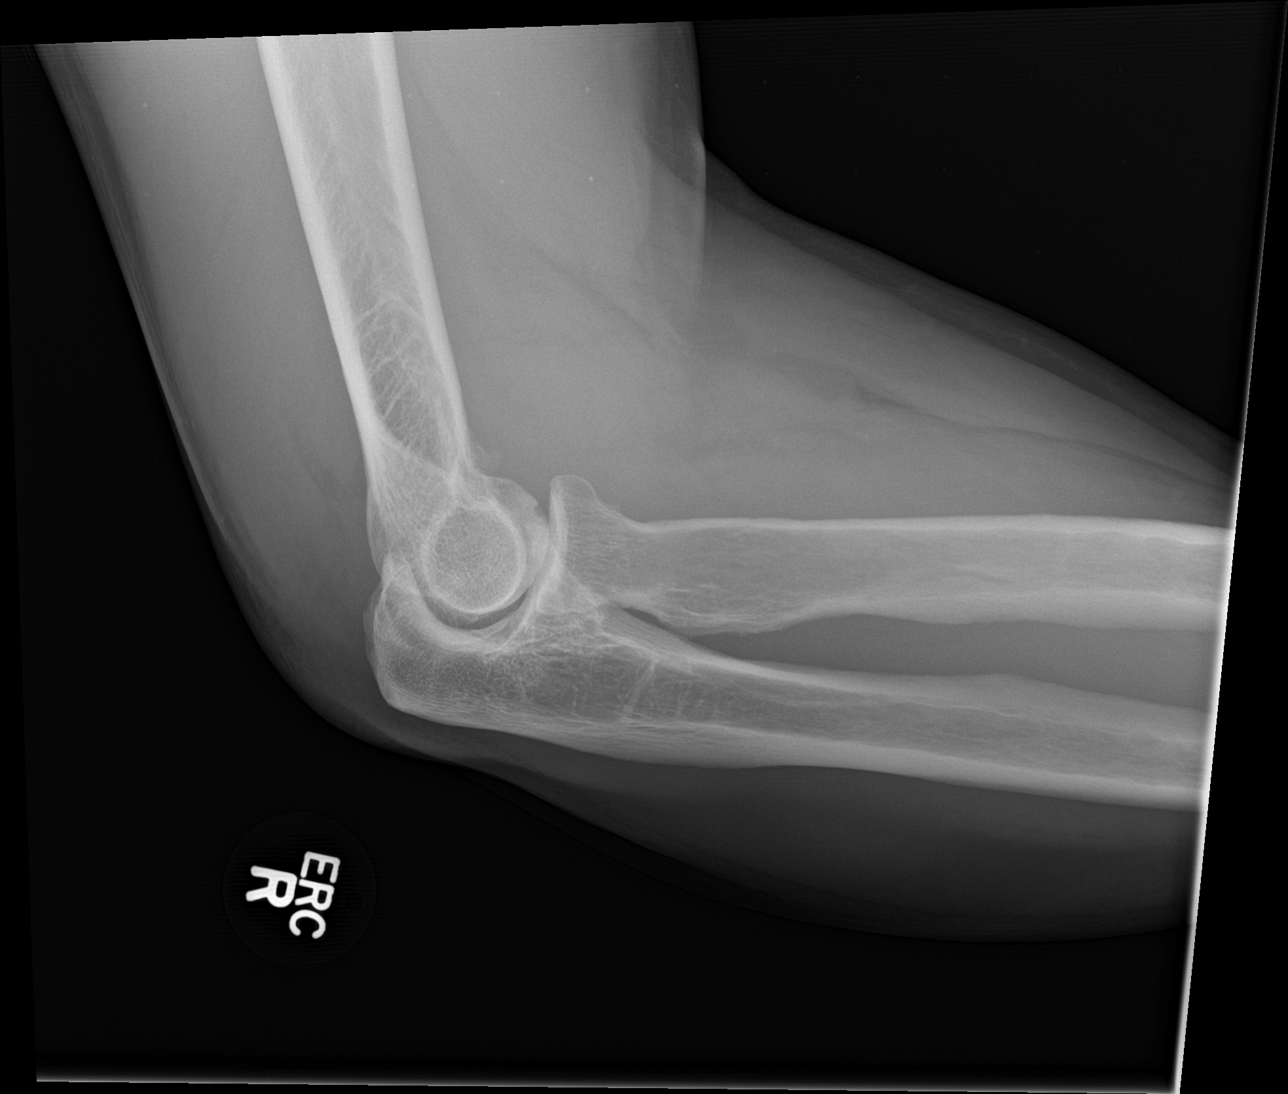

[4 of 4 positions shown; findings below may reference images not displayed]

FINDINGS: Degenerative changes in the right elbow. No definite effusion.
Posterior soft tissue swelling. Suggestion of a tiny erosion along
the articular surface of the distal humerus, likely at the capitate.
This may indicate bone changes of gout. No expansile or destructive
bone lesions otherwise identified.
IMPRESSION: No acute bony abnormalities. Suggestion of small bone erosion in the
capitellum possibly representing changes of gout. No significant
effusion.

## 2016-11-15 MED ORDER — PREDNISONE 10 MG PO TABS: ORAL_TABLET | ORAL | 0 refills | Status: DC

## 2016-11-15 MED ORDER — HYDROCODONE-ACETAMINOPHEN 5-325 MG PO TABS
2.0000 | ORAL_TABLET | Freq: Once | ORAL | Status: AC
  Administered 2016-11-15: 2 via ORAL
  Filled 2016-11-15: qty 2

## 2016-11-15 MED ORDER — HYDROCODONE-ACETAMINOPHEN 5-325 MG PO TABS: 1.0000 | ORAL_TABLET | Freq: Four times a day (QID) | ORAL | 0 refills | Status: DC | PRN

## 2016-11-15 MED ORDER — PREDNISONE 20 MG PO TABS
40.0000 mg | ORAL_TABLET | ORAL | Status: AC
  Administered 2016-11-15: 40 mg via ORAL
  Filled 2016-11-15: qty 2

## 2016-11-15 MED ORDER — DOCUSATE SODIUM 100 MG PO CAPS: ORAL_CAPSULE | ORAL | 0 refills | Status: DC

## 2016-11-15 NOTE — ED Triage Notes (Signed)
Pt arrived via EMS from home with c/o right elbow pain for 3 days; history of gout to both feet but never in his elbow; elbow swollen; denies injury; pt takes no daily home medications for gout; ambulatory with steady gait

## 2016-11-15 NOTE — ED Notes (Signed)
Patient reports having pain to right elbow and right hand since Thursday, denies any injury.  Reports history of gout in his feet and states pain feels similar.  Right elbow with slight swelling noted and warmth to touch, right hand/wrist no swelling, bruising or deformity noted.  Cap refill within normal limits, good right radial pulse.

## 2016-11-15 NOTE — ED Provider Notes (Signed)
[facility] Emergency Department Provider Note  ____________________________________________   First MD Initiated Contact with Patient 11/15/16 [phone]     (approximate)  I have reviewed the triage vital signs and the nursing notes.   HISTORY  Chief Complaint Joint Swelling    HPI Hector [name] is a 51 y.o. male with medical history as listed below that most significantly includes multiple past episodes of gout which typically affects his feet.  He presents by EMS today for pain in the right elbow and right hand.  He states that the pain feels similar to prior gout but he has just not had it in that location.  He reports some mild swelling similar to what happens with his feet.  He has not had any injury of which she is aware.  The patient denies fever/chills, chest pain, shortness of breath, nausea, vomiting, abdominal pain, and dysuria.he reports that they got this happen regardless of what he eats.  He does not take any regular medications such as colchicine nor allopurinol to avoid having gout flares.  He has no numbness or tingling in his right arm and no weakness.  He reports the pain is severe although or [place] he is in no acute distress.   Past Medical History:  Diagnosis Date  . Alcohol abuse   . 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 12/2007 MV: EF 28%, no isch;  b. 8/12 Echo: EF 25-35%; c. 02/2014 Echo: EF 20-25%; d. 12/2014 Cath: minimal CAD; e. 01/2015 Echo: EF 50-55%;  d. 05/2016 Echo: EF 30-35%, diff HK, gr2 DD.  Marland Kitchen Chronic combined systolic (congestive) and diastolic (congestive) heart failure ([place])    a. 05/2016 Most recent Echo: EF 30-35%, diff HK, Gr2 DD, mild MR, mod dil LA.  Marland Kitchen Chronic combined systolic and diastolic heart failure, NYHA class 2 ([place])    a. see above  . CKD (chronic kidney disease), stage III   . Elevated troponin    Chronically elevated. - level was 0.17-0.10 during recent admission  . Essential hypertension   . GI  bleed 11/2013  . Hyperlipidemia   . Pancreatitis   . Paroxysmal A-fib ([place])    a. new onset s/p unsuccessful TEE/DCCV on 08/16/2014; b. on amio/eliquis (CHA2DS2VASc = 2-3); c. reports 1-2 hrs of afib ~ q82mos.  . Paroxysmal atrial flutter ([place])    a. new onset 07/2014; b. s/p unsuccessful TEE/DCCV 08/16/2014; c. on apixaban.  . Sleep-disordered breathing    Has yet to have a sleep study    Patient Active Problem List   Diagnosis Date Noted  . Chronic systolic heart failure ([place]) 07/21/2016  . Obstructive sleep apnea 04/21/2016  . GI bleed 10/08/2015  . Nonischemic cardiomyopathy ([place]) 05/26/2015  . Chronic diastolic heart failure ([place]) 04/01/2015  . Essential hypertension 04/01/2015  . Sinus bradycardia 01/29/2015  . Paroxysmal atrial fibrillation ([place]) 08/16/2014  . Paroxysmal atrial flutter ([place]) 08/14/2014    Class: Acute  . Chronic kidney disease (CKD), stage III (moderate) 07/02/2014  . Hypertensive heart disease   . Hyperlipidemia     Past Surgical History:  Procedure Laterality Date  . CARDIAC CATHETERIZATION N/A 01/09/2015   Procedure: Left Heart Cath and Coronary Angiography;  Surgeon: [name], MD;  Location: [place] CV LAB;  Service: Cardiovascular;  Laterality: N/A;  . ELECTROPHYSIOLOGIC STUDY N/A 08/16/2014   Procedure: CARDIOVERSION;  Surgeon: [name], MD;  Location: [facility] ORS;  Service: Cardiovascular;  Laterality: N/A;  . FLEXIBLE SIGMOIDOSCOPY N/A  10/10/2015   Procedure: FLEXIBLE SIGMOIDOSCOPY;  Surgeon: [name], MD;  [facility] ENDOSCOPY;  Service: Endoscopy;  Laterality: N/A;  . KNEE SURGERY Right   . NM MYOVIEW LTD  November 2011   No ischemia or infarction. EF 50-55% ( no improvement from 2009 Myoview EF of 28%  . TEE WITHOUT CARDIOVERSION N/A 08/16/2014   Procedure: TRANSESOPHAGEAL ECHOCARDIOGRAM (TEE);  Surgeon: [name], MD;  Location: [facility] ORS;  Service: Cardiovascular;  Laterality: N/A;    Prior to Admission  medications   Medication Sig Start Date End Date Taking? Authorizing Provider  amiodarone (PACERONE) 200 MG tablet Take 1 tablet (200 mg total) by mouth daily. 05/22/15   [name], MD  amLODipine (NORVASC) 10 MG tablet Take 1 tablet (10 mg total) by mouth daily. 01/22/16 11/03/16  Wende Bushy, MD  apixaban (ELIQUIS) 5 MG TABS tablet Take 1 tablet (5 mg total) by mouth 2 (two) times daily. 07/29/16   [name], MD  atorvastatin (LIPITOR) 80 MG tablet Take 1 tablet (80 mg total) by mouth daily. 12/04/15   Alisa Graff, FNP  carvedilol (COREG) 3.125 MG tablet Take 1 tablet (3.125 mg total) by mouth 2 (two) times daily with a meal. 07/29/16   [place], Peter M, MD  docusate sodium (COLACE) 100 MG capsule Take 1 tablet once or twice daily as needed for constipation while taking narcotic pain medicine 11/15/16   Hinda Kehr, MD  furosemide (LASIX) 40 MG tablet Take 1 tablet (40 mg total) by mouth daily. 07/29/16   [place], Peter M, MD  HYDROcodone-acetaminophen (NORCO/VICODIN) 5-325 MG tablet Take 1-2 tablets by mouth every 6 (six) hours as needed for moderate pain. 11/15/16   Hinda Kehr, MD  levothyroxine (SYNTHROID, LEVOTHROID) 100 MCG tablet Take 100 mcg by mouth daily before breakfast.    [provider]  predniSONE (DELTASONE) 10 MG tablet Take 4 tabs (40 mg) PO x 7 days, then take 2 tabs (20 mg) PO x 5 days, then take 1 tab (10 mg) PO x 5 days, then take 1/2 tab (5 mg) PO x 4 days. 11/15/16   Hinda Kehr, MD  sacubitril-valsartan (ENTRESTO) 49-51 MG Take 1 tablet by mouth 2 (two) times daily. 11/04/16   Rogelia Mire, NP    Allergies Eggs or egg-derived products  Family History  Problem Relation Age of Onset  . Hypertension Mother   . Hyperlipidemia Mother   . Diabetes Mother     Social History Social History  Substance Use Topics  . Smoking status: Former Smoker    Packs/day: 1.00    Years: 12.00    Quit date: 02/23/1998  . Smokeless tobacco: Never Used  .  Alcohol use No     Comment: 4 heavy alcohol drinker. Quit 2 years ago.    Review of Systems Constitutional: No fever/chills Eyes: No visual changes. Cardiovascular: Denies chest pain. Respiratory: Denies shortness of breath. Gastrointestinal: No abdominal pain.  No nausea, no vomiting.  No diarrhea.  No constipation. Genitourinary: Negative for dysuria. Musculoskeletal: pain in right elbow and right hand that feels similar to prior gout pain. Negative for neck pain.  Negative for back pain. Integumentary: Negative for rash. Neurological: Negative for headaches, focal weakness or numbness.   ____________________________________________   PHYSICAL EXAM:  VITAL SIGNS: ED Triage Vitals  Enc Vitals Group     BP 11/15/16 0059 (!) 139/94     Pulse Rate 11/15/16 0059 72     Resp 11/15/16 0059 18  Temp 11/15/16 0059 99.9 F (37.7 C)     Temp Source 11/15/16 0059 Oral     SpO2 11/15/16 0059 95 %     Weight 11/15/16 0100 131.1 kg (289 lb)     Height 11/15/16 0100 1.803 m (5\' 11" )     Head Circumference --      Peak Flow --      Pain Score 11/15/16 0059 8     Pain Loc --      Pain Edu? --      Excl. in [place]? --     Constitutional: Alert and oriented. Well appearing and in no acute distress. Eyes: Conjunctivae are normal.  Head: Atraumatic. Cardiovascular: Normal rate, regular rhythm. Good peripheral circulation. Grossly normal heart sounds. Respiratory: Normal respiratory effort.  No retractions. Lungs CTAB. Gastrointestinal: Soft and nontender. No distention.  Musculoskeletal: the patient has some tenderness with a little bit of swelling of the right knee but no large effusion or swelling that would indicate a bursitis.  His compartments are soft and easily palpable.  He has some tenderness specifically to the middle finger of the right hand and no large amount of swelling.  There is no erythema of the hand nor arm and no signs of cellulitis, fluctuance, nor  induration. Neurologic:  Normal speech and language. No gross focal neurologic deficits are appreciated.  Skin:  Skin is warm, dry and intact. No rash noted. Psychiatric: Mood and affect are normal. Speech and behavior are normal.  ____________________________________________   LABS (all labs ordered are listed, but only abnormal results are displayed)  Labs Reviewed  URIC ACID - Abnormal; Notable for the following:       Result Value   Uric Acid, Serum 11.0 (*)    All other components within normal limits  CBC WITH DIFFERENTIAL/PLATELET - Abnormal; Notable for the following:    WBC 11.3 (*)    RDW 15.8 (*)    Neutro Abs 8.7 (*)    All other components within normal limits  BASIC METABOLIC PANEL - Abnormal; Notable for the following:    Glucose, Bld 150 (*)    BUN 30 (*)    Creatinine, Ser 2.19 (*)    Calcium 8.8 (*)    GFR calc non Af Amer 33 (*)    GFR calc Af Amer 38 (*)    All other components within normal limits   ____________________________________________  EKG  None - EKG not ordered by ED physician ____________________________________________  RADIOLOGY   Dg Elbow Complete Right  Result Date: 11/15/2016 CLINICAL DATA:  Pain and swelling in the right elbow for 3 days. No injury. History of gout. EXAM: RIGHT ELBOW - COMPLETE 3+ VIEW COMPARISON:  None. FINDINGS: Degenerative changes in the right elbow. No definite effusion. Posterior soft tissue swelling. Suggestion of a tiny erosion along the articular surface of the distal humerus, likely at the capitate. This may indicate bone changes of gout. No expansile or destructive bone lesions otherwise identified. IMPRESSION: No acute bony abnormalities. Suggestion of small bone erosion in the capitellum possibly representing changes of gout. No significant effusion. Electronically Signed   By: Lucienne Capers M.D.   On: 11/15/2016 05:15    ____________________________________________   PROCEDURES  Critical Care  performed: No   Procedure(s) performed:   Procedures   ____________________________________________   INITIAL IMPRESSION / ASSESSMENT AND PLAN / ED COURSE  Pertinent labs & imaging results that were available during my care of the patient were reviewed by me and  considered in my medical decision making (see chart for details).   differential includes but is not limited to gout, upper extremity DVT, musculoskeletal strain, cellulitis, necrotizing fasciitis, etc.I am going to investigate some of the current recommendations regarding gout but I think that is by far the most likely diagnosis.  He has no sign of acute infection.  I obtained radiographs of the elbow and the radiologist commented that it does appear that there is some gouty changes.  No osteomyelitis.  Clinical Course as of Nov 15 753  Sun Nov 15, 2016  0547 I reviewed the current recommendations in UpToDate regarding gout diagnosis and treatment.  Because the patient has significant chronic kidney disease I am going to avoid NSAIDs in favor of glucocorticoid therapy.  I will start him on 40 mg of prednisone for about a week and then taper him.  I will check some baseline labs but I believe that acute gout is most likely diagnosis as discussed above.  [CF]  5852 labs are notable for a creatinine of 2.19 which is about his baseline, I very mild leukocytosis of 11.3, and a significantly elevated uric acid level was 11.  This strongly suggests my prior anticipated diagnosis of gout.  I will treat him as described above and encouraged him to follow up as an outpatient.  I gave my usual and customary return precautions.     [CF]    Clinical Course User Index [CF] Hinda Kehr, MD    ____________________________________________  FINAL CLINICAL IMPRESSION(S) / ED DIAGNOSES  Final diagnoses:  Acute gout, unspecified cause, unspecified site  Right arm pain     MEDICATIONS GIVEN DURING THIS VISIT:  Medications  predniSONE  (DELTASONE) tablet 40 mg (40 mg Oral Given 11/15/16 0654)  HYDROcodone-acetaminophen (NORCO/VICODIN) 5-325 MG per tablet 2 tablet (2 tablets Oral Given 11/15/16 0653)     NEW OUTPATIENT MEDICATIONS STARTED DURING THIS VISIT:  Discharge Medication List as of 11/15/2016  6:58 AM    START taking these medications   Details  docusate sodium (COLACE) 100 MG capsule Take 1 tablet once or twice daily as needed for constipation while taking narcotic pain medicine, Print    HYDROcodone-acetaminophen (NORCO/VICODIN) 5-325 MG tablet Take 1-2 tablets by mouth every 6 (six) hours as needed for moderate pain., Starting Sun 11/15/2016, Print    predniSONE (DELTASONE) 10 MG tablet Take 4 tabs (40 mg) PO x 7 days, then take 2 tabs (20 mg) PO x 5 days, then take 1 tab (10 mg) PO x 5 days, then take 1/2 tab (5 mg) PO x 4 days., Print        Discharge Medication List as of 11/15/2016  6:58 AM      Discharge Medication List as of 11/15/2016  6:58 AM       Note:  This document was prepared using Dragon voice recognition software and may include unintentional dictation errors.    Hinda Kehr, MD 11/15/16 [phone]

## 2016-11-15 NOTE — ED Triage Notes (Signed)
Patient brought in by ems from home. Patient with complaint of right elbow pain radiating to his hand. Patient has a history of gout and states that the pain feels similar to previous gout.

## 2016-11-26 ENCOUNTER — Other Ambulatory Visit: Payer: Medicaid Other

## 2016-12-03 ENCOUNTER — Ambulatory Visit (INDEPENDENT_AMBULATORY_CARE_PROVIDER_SITE_OTHER): Payer: Medicaid Other

## 2016-12-03 ENCOUNTER — Other Ambulatory Visit: Payer: Self-pay

## 2016-12-03 DIAGNOSIS — I5022 Chronic systolic (congestive) heart failure: Secondary | ICD-10-CM | POA: Diagnosis not present

## 2016-12-14 ENCOUNTER — Ambulatory Visit: Payer: Medicaid Other | Admitting: Family

## 2016-12-21 ENCOUNTER — Encounter: Payer: Self-pay | Admitting: Family

## 2016-12-21 ENCOUNTER — Ambulatory Visit: Payer: Medicaid Other | Attending: Family | Admitting: Family

## 2016-12-21 VITALS — BP 130/90 | HR 64 | Resp 20 | Ht 71.0 in | Wt 284.2 lb

## 2016-12-21 DIAGNOSIS — Z7901 Long term (current) use of anticoagulants: Secondary | ICD-10-CM | POA: Insufficient documentation

## 2016-12-21 DIAGNOSIS — E785 Hyperlipidemia, unspecified: Secondary | ICD-10-CM | POA: Insufficient documentation

## 2016-12-21 DIAGNOSIS — I2510 Atherosclerotic heart disease of native coronary artery without angina pectoris: Secondary | ICD-10-CM | POA: Insufficient documentation

## 2016-12-21 DIAGNOSIS — Z87891 Personal history of nicotine dependence: Secondary | ICD-10-CM | POA: Insufficient documentation

## 2016-12-21 DIAGNOSIS — G4733 Obstructive sleep apnea (adult) (pediatric): Secondary | ICD-10-CM | POA: Diagnosis not present

## 2016-12-21 DIAGNOSIS — M109 Gout, unspecified: Secondary | ICD-10-CM | POA: Insufficient documentation

## 2016-12-21 DIAGNOSIS — K859 Acute pancreatitis without necrosis or infection, unspecified: Secondary | ICD-10-CM | POA: Insufficient documentation

## 2016-12-21 DIAGNOSIS — I5032 Chronic diastolic (congestive) heart failure: Secondary | ICD-10-CM | POA: Diagnosis not present

## 2016-12-21 DIAGNOSIS — R5383 Other fatigue: Secondary | ICD-10-CM | POA: Diagnosis present

## 2016-12-21 DIAGNOSIS — I4892 Unspecified atrial flutter: Secondary | ICD-10-CM | POA: Diagnosis not present

## 2016-12-21 DIAGNOSIS — I130 Hypertensive heart and chronic kidney disease with heart failure and stage 1 through stage 4 chronic kidney disease, or unspecified chronic kidney disease: Secondary | ICD-10-CM | POA: Diagnosis not present

## 2016-12-21 DIAGNOSIS — Z7289 Other problems related to lifestyle: Secondary | ICD-10-CM | POA: Insufficient documentation

## 2016-12-21 DIAGNOSIS — Z79899 Other long term (current) drug therapy: Secondary | ICD-10-CM | POA: Diagnosis not present

## 2016-12-21 DIAGNOSIS — N183 Chronic kidney disease, stage 3 (moderate): Secondary | ICD-10-CM | POA: Diagnosis not present

## 2016-12-21 DIAGNOSIS — I10 Essential (primary) hypertension: Secondary | ICD-10-CM

## 2016-12-21 DIAGNOSIS — I480 Paroxysmal atrial fibrillation: Secondary | ICD-10-CM | POA: Insufficient documentation

## 2016-12-21 NOTE — Patient Instructions (Signed)
Continue weighing daily and call for an overnight weight gain of > 2 pounds or a weekly weight gain of >5 pounds. 

## 2016-12-21 NOTE — Progress Notes (Signed)
[name], male    DOB: 11/25/1965, 51 y.o.   MRN: [id number]  HPI  Hector [name] is a 51 y/o male with a history of MI, hyperlipidemia, HTN, CKD, atrial fibrillation, alcohol use, cardiomyopathy, and CHF.   Echo done 12/03/16 showed an EF of 45-50% which is an improvement from previous echo which was done on 06/02/16 and showed an EF of 30-35%. Prior echo on 02/21/15 which showed a normalization of his EF which was 50-55% with mild Hector. No longer ICD candidate. Prior echo on 03/07/14 showed an EF of 20-25% with mild/mod Hector. He was started on [facility] Feb 2016. Last left heart cath was done 01/09/15.   Was in the ED 11/15/16 for gout. He was treated and released.     He presents today for a follow-up visit with a chief complaint of mild fatigue upon moderate exertion. He describes this as chronic in nature having been present for several years with varying levels of severity. He denies any chest pain, shortness of breath, edema, palpitations, dizziness, weight gain or difficulty sleeping. Continues to walk to work which is ~ 1/4 mile each way.  Past Medical History:  Diagnosis Date  . Alcohol abuse   . 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 12/2007 MV: EF 28%, no isch;  b. 8/12 Echo: EF 25-35%; c. 02/2014 Echo: EF 20-25%; d. 12/2014 Cath: minimal CAD; e. 01/2015 Echo: EF 50-55%;  d. 05/2016 Echo: EF 30-35%, diff HK, gr2 DD.  Marland Kitchen Chronic combined systolic (congestive) and diastolic (congestive) heart failure ([place])    a. 05/2016 Most recent Echo: EF 30-35%, diff HK, Gr2 DD, mild Hector, mod dil LA.  Marland Kitchen Chronic combined systolic and diastolic heart failure, NYHA class 2 ([place])    a. see above  . CKD (chronic kidney disease), stage III   . Elevated troponin    Chronically elevated. - level was 0.17-0.10 during recent admission  . Essential hypertension   . GI bleed 11/2013  . Hyperlipidemia   . Pancreatitis   . Paroxysmal A-fib ([place])    a. new onset s/p unsuccessful TEE/DCCV on  08/16/2014; b. on amio/eliquis (CHA2DS2VASc = 2-3); c. reports 1-2 hrs of afib ~ q43mos.  . Paroxysmal atrial flutter ([place])    a. new onset 07/2014; b. s/p unsuccessful TEE/DCCV 08/16/2014; c. on apixaban.  . Sleep-disordered breathing    Has yet to have a sleep study   Past Surgical History:  Procedure Laterality Date  . CARDIAC CATHETERIZATION N/A 01/09/2015   Procedure: Left Heart Cath and Coronary Angiography;  Surgeon: [name], MD;  Location: [place] CV LAB;  Service: Cardiovascular;  Laterality: N/A;  . ELECTROPHYSIOLOGIC STUDY N/A 08/16/2014   Procedure: CARDIOVERSION;  Surgeon: [name], MD;  Location: [facility] ORS;  Service: Cardiovascular;  Laterality: N/A;  . FLEXIBLE SIGMOIDOSCOPY N/A 10/10/2015   Procedure: FLEXIBLE SIGMOIDOSCOPY;  Surgeon: [name], MD;  Location: [facility] ENDOSCOPY;  Service: Endoscopy;  Laterality: N/A;  . KNEE SURGERY Right   . NM MYOVIEW LTD  November 2011   No ischemia or infarction. EF 50-55% ( no improvement from 2009 Myoview EF of 28%  . TEE WITHOUT CARDIOVERSION N/A 08/16/2014   Procedure: TRANSESOPHAGEAL ECHOCARDIOGRAM (TEE);  Surgeon: [name], MD;  Location: [facility] ORS;  Service: Cardiovascular;  Laterality: N/A;   Family History  Problem Relation Age of Onset  . Hypertension Mother   . Hyperlipidemia Mother   . Diabetes Mother    Social History  Substance Use Topics  . Smoking status: Former Smoker    Packs/day: 1.00    Years: 12.00    Quit date: 02/23/1998  . Smokeless tobacco: Never Used  . Alcohol use No     Comment: 4 heavy alcohol drinker. Quit 2 years ago.   Allergies  Allergen Reactions  . Eggs Or Egg-Derived Products Rash   Prior to Admission medications   Medication Sig Start Date End Date Taking? Authorizing Provider  amiodarone (PACERONE) 200 MG tablet Take 1 tablet (200 mg total) by mouth daily. 05/22/15  Yes [name], MD  amLODipine (NORVASC) 10 MG tablet Take 1 tablet (10 mg total) by mouth  daily. 01/22/16 12/21/16 Yes Wende Bushy, MD  apixaban (ELIQUIS) 5 MG TABS tablet Take 1 tablet (5 mg total) by mouth 2 (two) times daily. 07/29/16  Yes [name], MD  atorvastatin (LIPITOR) 80 MG tablet Take 1 tablet (80 mg total) by mouth daily. 12/04/15  Yes Alisa Graff, FNP  carvedilol (COREG) 3.125 MG tablet Take 1 tablet (3.125 mg total) by mouth 2 (two) times daily with a meal. 07/29/16  Yes [place], Peter M, MD  docusate sodium (COLACE) 100 MG capsule Take 1 tablet once or twice daily as needed for constipation while taking narcotic pain medicine 11/15/16  Yes Hinda Kehr, MD  furosemide (LASIX) 40 MG tablet Take 1 tablet (40 mg total) by mouth daily. 07/29/16  Yes [place], Peter M, MD  HYDROcodone-acetaminophen (NORCO/VICODIN) 5-325 MG tablet Take 1-2 tablets by mouth every 6 (six) hours as needed for moderate pain. 11/15/16  Yes Hinda Kehr, MD  levothyroxine (SYNTHROID, LEVOTHROID) 100 MCG tablet Take 100 mcg by mouth daily before breakfast.   Yes [provider]  sacubitril-valsartan (ENTRESTO) 49-51 MG Take 1 tablet by mouth 2 (two) times daily. 11/04/16  Yes Rogelia Mire, NP   Review of Systems  Constitutional: Positive for fatigue (minimal). Negative for appetite change.  HENT: Negative for congestion, postnasal drip and sore throat.   Eyes: Negative.   Respiratory: Negative for chest tightness, shortness of breath and wheezing.   Cardiovascular: Negative for chest pain, palpitations and leg swelling.  Gastrointestinal: Negative for abdominal distention and abdominal pain.  Endocrine: Negative.   Genitourinary: Negative.   Musculoskeletal: Negative for back pain and neck pain.  Skin: Negative.   Allergic/Immunologic: Negative.   Neurological: Negative for dizziness and light-headedness.  Hematological: Negative for adenopathy. Does not bruise/bleed easily.  Psychiatric/Behavioral: Negative for dysphoric mood and sleep disturbance (wearing CPAP nightly).  The patient is not nervous/anxious.    Vitals:   12/21/16 0954  BP: 130/90  Pulse: 64  Resp: 20  SpO2: 95%  Weight: 284 lb 4 oz (128.9 kg)  Height: 5\' 11"  (1.803 m)   Wt Readings from Last 3 Encounters:  12/21/16 284 lb 4 oz (128.9 kg)  11/15/16 289 lb (131.1 kg)  11/03/16 289 lb 4 oz (131.2 kg)    Lab Results  Component Value Date   CREATININE 2.19 (H) 11/15/2016   CREATININE 2.03 (H) 11/11/2016   CREATININE 1.95 (H) 10/16/2016   Physical Exam  Constitutional: He is oriented to person, place, and time. He appears well-developed and well-nourished.  HENT:  Head: Normocephalic and atraumatic.  Neck: Normal range of motion. Neck supple. No JVD present.  Cardiovascular: Normal rate and regular rhythm.   Pulmonary/Chest: Effort normal. He has no wheezes. He has no rales.  Abdominal: Soft. He exhibits no distension. There is no tenderness.  Musculoskeletal: He  exhibits no edema or tenderness.  Neurological: He is alert and oriented to person, place, and time.  Skin: Skin is warm and dry.  Psychiatric: He has a normal mood and affect. His behavior is normal. Thought content normal.  Nursing note and vitals reviewed.    Assessment & Plan:  1: Chronic heart failure with preserved ejection fraction- - NYHA Class II - euvolemic today - weight down another 6 pounds from last visit here. Reminded to call for an overnight weight gain of >2 pounds or a weekly weight gain of >5 pounds - since February 2018, he has lost 16 pounds - is planning on joining the [facility] for exercise - not adding salt. Reminded to keep salt under 2000 mg in a day - tolerating entresto without known side effects - EF has improved so no indication for ICD at this time - saw cardiology Sharolyn Douglas) 11/03/16 - walking to work (1/4 mile one way) and eating less - BMP from 11/15/16 reviewed and shows sodium 136, potassium 4.0 and GFR 38 - PharmD reviewed medications with the patient  2: HTN- - BP looks good  today - max dose amlodipine, low dose carvedilol due to HR, and furosemide 40 mg daily - sees nephrologist [facility] November 2018  3: Atrial fibrillation- - currently rate controlled  - taking amiodarone, apixaban and carvedilol  4: Obstructive sleep apnea- - sleeping well at night with CPAP machine - wears it 4-5 hours nightly at least 3 times/week; says that he doesn't wear it nightly because it "irritates him" - does admit that he rests better on the nights that he wears it; encouraged him to wear it nightly   Patient did not bring his medications nor a list. Each medication was verbally reviewed with the patient and he was encouraged to bring the bottles to every visit to confirm accuracy of list.  Return in 6 months or sooner for any questions/problems before then.

## 2017-02-02 ENCOUNTER — Ambulatory Visit: Payer: Medicaid Other | Admitting: Cardiovascular Disease

## 2017-02-24 ENCOUNTER — Other Ambulatory Visit: Payer: Self-pay

## 2017-02-24 DIAGNOSIS — I4892 Unspecified atrial flutter: Secondary | ICD-10-CM

## 2017-02-24 MED ORDER — AMIODARONE HCL 200 MG PO TABS: 200.0000 mg | ORAL_TABLET | Freq: Every day | ORAL | 3 refills | Status: DC

## 2017-02-28 NOTE — Progress Notes (Deleted)
Cardiology Office Note  Date:  02/28/2017   ID:  Hector [name], DOB Sep 04, 1965, MRN [id number]  PCP:  [name], MD   No chief complaint on file.   HPI:    Mr Ausborn is a 52 y/o male with a history of  hyperlipidemia,  HTN,  CKD,  atrial fibrillation,  alcohol cardiomyopathy,  Cath 12/2014 with minimal CAD Chronic diastolic and systolic CHF CKD Paroxysmal atrial fib Who presents for follow up of his chronic systolic CHF, atrial fib  Echo  12/03/16 showed an EF of 45-50%   previous echo  06/02/16 and showed an EF of 30-35%.  echo on 02/21/15  EF which was 50-55% with mild MR.  echo on 03/07/14 showed an EF of 20-25% with mild/mod MR.  started on Entresto Feb 2016.  left heart cath was done 01/09/15.     mild fatigue upon moderate exertion.  chronic in nature having been present for several years with varying levels of severity. He denies any chest pain, shortness of breath, edema, palpitations, dizziness, weight gain or difficulty sleeping. Continues to walk to work which is ~ 1/4 mile each way.  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  PSH:    Past Surgical History:  Procedure Laterality Date  . CARDIAC CATHETERIZATION N/A 01/09/2015   Procedure: Left Heart Cath and Coronary Angiography;  Surgeon: [name], MD;  Location: [place] [facility];  Service: Cardiovascular;  Laterality: N/A;  . ELECTROPHYSIOLOGIC STUDY N/A 08/16/2014   Procedure: CARDIOVERSION;  Surgeon: [name], MD;  Location: [facility] ORS;  Service:  Cardiovascular;  Laterality: N/A;  . FLEXIBLE SIGMOIDOSCOPY N/A 10/10/2015   Procedure: FLEXIBLE SIGMOIDOSCOPY;  Surgeon: [name], MD;  Location: [facility] ENDOSCOPY;  Service: Endoscopy;  Laterality: N/A;  . KNEE SURGERY Right   . NM MYOVIEW LTD  November 2011   No ischemia or infarction. EF 50-55% ( no improvement from 2009 Myoview EF of 28%  . TEE WITHOUT CARDIOVERSION N/A 08/16/2014   Procedure: TRANSESOPHAGEAL ECHOCARDIOGRAM (TEE);  Surgeon: [name], MD;  Location: [facility] ORS;  Service: Cardiovascular;  Laterality: N/A;    Current Outpatient Medications  Medication Sig Dispense Refill  . amiodarone (PACERONE) 200 MG tablet Take 1 tablet (200 mg total) by mouth daily. 30 tablet 3  . amLODipine (NORVASC) 10 MG tablet Take 1 tablet (10 mg total) by mouth daily. 30 tablet 6  . apixaban (ELIQUIS) 5 MG TABS tablet Take 1 tablet (5 mg total) by mouth 2 (two) times daily. 60 tablet 5  . atorvastatin (LIPITOR) 80 MG tablet Take 1 tablet (80 mg total) by mouth daily. 30 tablet 5  . carvedilol (COREG) 3.125 MG tablet Take 1 tablet (3.125 mg total) by mouth 2 (two) times daily with a meal. 180 tablet 3  . docusate sodium (COLACE) 100 MG capsule Take 1 tablet once or twice daily as needed for constipation while taking narcotic pain medicine 30 capsule 0  . furosemide (LASIX) 40 MG tablet Take 1 tablet (40 mg total) by mouth daily. 90 tablet 3  . HYDROcodone-acetaminophen (NORCO/VICODIN) 5-325 MG tablet Take 1-2 tablets by mouth every 6 (six) hours as needed  for moderate pain. 15 tablet 0  . levothyroxine (SYNTHROID, LEVOTHROID) 100 MCG tablet Take 100 mcg by mouth daily before breakfast.    . sacubitril-valsartan (ENTRESTO) 49-51 MG Take 1 tablet by mouth 2 (two) times daily. 60 tablet 6   No current facility-administered medications for this visit.      Allergies:   Eggs or egg-derived products   Social History:  The patient  reports that he quit smoking about 19 years ago. He has a  12.00 pack-year smoking history. he has never used smokeless tobacco. He reports that he does not drink alcohol or use drugs.   Family History:   family history includes Diabetes in his mother; Hyperlipidemia in his mother; Hypertension in his mother.    Review of Systems: ROS   PHYSICAL EXAM: VS:  There were no vitals taken for this visit. , BMI There is no height or weight on file to calculate BMI. GEN: Well nourished, well developed, in no acute distress HEENT: normal Neck: no JVD, carotid bruits, or masses Cardiac: RRR; no murmurs, rubs, or gallops,no edema  Respiratory:  clear to auscultation bilaterally, normal work of breathing GI: soft, nontender, nondistended, + BS MS: no deformity or atrophy Skin: warm and dry, no rash Neuro:  Strength and sensation are intact Psych: euthymic mood, full affect    Recent Labs: 11/11/2016: TSH 19.060 11/15/2016: BUN 30; Creatinine, Ser 2.19; Hemoglobin 14.7; Platelets 203; Potassium 4.0; Sodium 136    Lipid Panel Lab Results  Component Value Date   CHOL 387 (H) 12/12/2015   HDL 56 12/12/2015   LDLCALC 287 (H) 12/12/2015   TRIG 218 (H) 12/12/2015      Wt Readings from Last 3 Encounters:  12/21/16 284 lb 4 oz (128.9 kg)  11/15/16 289 lb (131.1 kg)  11/03/16 289 lb 4 oz (131.2 kg)       ASSESSMENT AND PLAN:  No diagnosis found.   Disposition:   F/U  6 months  No orders of the defined types were placed in this encounter.    Signed, Esmond Plants, M.D., Ph.D. 02/28/2017  [place], [place]

## 2017-03-02 ENCOUNTER — Ambulatory Visit: Payer: Medicaid Other | Admitting: Cardiovascular Disease

## 2017-03-14 NOTE — Progress Notes (Signed)
Cardiology Office Note Date:  03/18/2017  Patient ID:  Hector [name] [birth year], MRN [id number] PCP:  [name], MD  Cardiologist:  Formerly Dr. Yvone Neu    Chief Complaint: Follow up  History of Present Illness: Hector [name] is a 52 y.o. male with history of nonobstructive CAD, chronic combined CHF, alcoholic NICM dating back to 2009, PAF/flutter, CKD stage III, HTN, HLD, obesity, sleep apnea on CPAP, hypothyroidism, and alcohol abuse who presents for follow up of his CHF.   He was previously followed by Dr. Ellyn Hack and most recently Dr. Yvone Neu. As noted, cardiomyopathy was diagnosed in 2009. Stress testing was undertaken at that time did not show any ischemia. It was felt that his cardiomyopathy was secondary to alcohol. He did quit drinking and EF has varied over the years. In 2016, he was diagnosed with atrial flutter and was cardioverted in June 2016. He subsequently reverted to atrial fibrillation and was placed on amiodarone and Eliquis. Since then, he has noted that he has an episode of palpitations occurring about every 3 months, and lasting about 2 hours. He is generally not all that symptomatic other than noting palpitations. In late 2016, after being seen by electrophysiology due to ongoing low LV function, he underwent diagnostic catheterization which showed nonobstructive CAD. Follow-up echocardiogram in December 2016 showed recovery of LV function and therefore ICD therapy was not indicated. A follow-up echo was performed in April 2018 and showed recurrent LV dysfunction with an EF of 30-35% and diffuse hypokinesis. He was seen in clinic and was placed back on Entresto. He was seen in clinic on 11/03/2016 and noted to be doing well. Weight stable at home with a clinic weight of 289 pounds. Entresto was titrated. He was noted to be back in Afib at that visit as well. He was compliant with medications. He was asymptomatic. Given he reported he typically goes into  Afib ~ every 3 months which typically lasts 1-2 hours he was advised to follow up with RN visit in 1 week. TSH was checked and noted to still be elevated at 19.060, though improved from 47.715 in 09/2015. Follow up with primary care was advised. Renal function was stable at 2.03 on titrated Entresto. Follow up RN visit on 11/11/16 showed he was back in sinus rhythm. Echo on 12/03/16 showed improvement in EF to 45-50%, mildly dilated LV, diffuse hypokinesis.   Seen in the ED 11/15/16 for gout. Labs showed a relatively stable SCr of 2.19, K+ 4.0, WBC 11.3.   He was most recently seen by the [facility] on 12/21/2016. Weight 284 pounds. BP 130/90, heart rate 64 bpm. No changes were made.   Comes in doing well today.  No chest pain or shortness of breath.  Weight continues to down trend at home.  Weight is down 11 pounds from last clinic visit.  Reports he is compliant with all medications and dietary choices.  Does not eat out at restaurants.  Does not eat foods high in salt.  Does not add salt to foods.  He is not checking blood pressure at home.  No orthopnea, cough, PND, lower semi-swelling, abdominal distention, or early satiety.  No concerns at this time.   Past Medical History:  Diagnosis Date  . Alcohol abuse   . 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 12/2007 MV: EF 28%, no isch;  b. 8/12 Echo: EF 25-35%; c. 02/2014 Echo: EF 20-25%; d. 12/2014 Cath: minimal CAD; e. 01/2015 Echo: EF 50-55%;  d. 05/2016 Echo: EF 30-35%, diff HK, gr2 DD.  Marland Kitchen Chronic combined systolic (congestive) and diastolic (congestive) heart failure ([place])    a. 05/2016 Most recent Echo: EF 30-35%, diff HK, Gr2 DD, mild MR, mod dil LA.  Marland Kitchen Chronic combined systolic and diastolic heart failure, NYHA class 2 ([place])    a. see above  . CKD (chronic kidney disease), stage III ([place])   . Elevated troponin    Chronically elevated. - level was 0.17-0.10 during recent admission  . Essential hypertension   . GI bleed 11/2013  .  Hyperlipidemia   . Pancreatitis   . Paroxysmal A-fib ([place])    a. new onset s/p unsuccessful TEE/DCCV on 08/16/2014; b. on amio/eliquis (CHA2DS2VASc = 2-3); c. reports 1-2 hrs of afib ~ q86mos.  . Paroxysmal atrial flutter ([place])    a. new onset 07/2014; b. s/p unsuccessful TEE/DCCV 08/16/2014; c. on apixaban.  . Sleep-disordered breathing    Has yet to have a sleep study    Past Surgical History:  Procedure Laterality Date  . CARDIAC CATHETERIZATION N/A 01/09/2015   Procedure: Left Heart Cath and Coronary Angiography;  Surgeon: [name], MD;  Location: [place] [facility];  Service: Cardiovascular;  Laterality: N/A;  . ELECTROPHYSIOLOGIC STUDY N/A 08/16/2014   Procedure: CARDIOVERSION;  Surgeon: [name], MD;  Location: [facility] ORS;  Service: Cardiovascular;  Laterality: N/A;  . FLEXIBLE SIGMOIDOSCOPY N/A 10/10/2015   Procedure: FLEXIBLE SIGMOIDOSCOPY;  Surgeon: [name], MD;  Location: [facility] ENDOSCOPY;  Service: Endoscopy;  Laterality: N/A;  . KNEE SURGERY Right   . NM MYOVIEW LTD  November 2011   No ischemia or infarction. EF 50-55% ( no improvement from 2009 Myoview EF of 28%  . TEE WITHOUT CARDIOVERSION N/A 08/16/2014   Procedure: TRANSESOPHAGEAL ECHOCARDIOGRAM (TEE);  Surgeon: [name], MD;  Location: [facility] ORS;  Service: Cardiovascular;  Laterality: N/A;    Current Meds  Medication Sig  . amiodarone (PACERONE) 200 MG tablet Take 1 tablet (200 mg total) by mouth daily.  Marland Kitchen amLODipine (NORVASC) 10 MG tablet Take 1 tablet (10 mg total) by mouth daily.  Marland Kitchen apixaban (ELIQUIS) 5 MG TABS tablet Take 1 tablet (5 mg total) by mouth 2 (two) times daily.  Marland Kitchen atorvastatin (LIPITOR) 80 MG tablet Take 1 tablet (80 mg total) by mouth daily.  . carvedilol (COREG) 3.125 MG tablet Take 1 tablet (3.125 mg total) by mouth 2 (two) times daily with a meal.  . docusate sodium (COLACE) 100 MG capsule Take 1 tablet once or twice daily as needed for constipation while taking narcotic  pain medicine  . furosemide (LASIX) 40 MG tablet Take 1 tablet (40 mg total) by mouth daily.  Marland Kitchen HYDROcodone-acetaminophen (NORCO/VICODIN) 5-325 MG tablet Take 1-2 tablets by mouth every 6 (six) hours as needed for moderate pain.  Marland Kitchen levothyroxine (SYNTHROID, LEVOTHROID) 100 MCG tablet Take 100 mcg by mouth daily before breakfast.  . sacubitril-valsartan (ENTRESTO) 49-51 MG Take 1 tablet by mouth 2 (two) times daily.    Allergies:   Esomeprazole magnesium and Eggs or egg-derived products   Social History:  The patient  reports that he quit smoking about 19 years ago. He has a 12.00 pack-year smoking history. he has never used smokeless tobacco. He reports that he does not drink alcohol or use drugs.   Family History:  The patient's family history includes Diabetes in his mother; Hyperlipidemia in his mother; Hypertension in his mother.  ROS:   Review of Systems  Constitutional: Negative for chills, diaphoresis, fever, malaise/fatigue and weight loss.  HENT: Negative for congestion.   Eyes: Negative for discharge and redness.  Respiratory: Negative for cough, hemoptysis, sputum production, shortness of breath and wheezing.   Cardiovascular: Negative for chest pain, palpitations, orthopnea, claudication, leg swelling and PND.  Gastrointestinal: Negative for abdominal pain, blood in stool, heartburn, melena, nausea and vomiting.  Genitourinary: Negative for hematuria.  Musculoskeletal: Negative for falls and myalgias.  Skin: Negative for rash.  Neurological: Negative for dizziness, tingling, tremors, sensory change, speech change, focal weakness, loss of consciousness and weakness.  Endo/Heme/Allergies: Does not bruise/bleed easily.  Psychiatric/Behavioral: Negative for substance abuse. The patient is not nervous/anxious.   All other systems reviewed and are negative.    PHYSICAL EXAM:  VS:  BP 110/80 (BP Location: Left Arm, Patient Position: Sitting, Cuff Size: Normal)   Pulse 60   Ht 5'  11" (1.803 m)   Wt 278 lb 8 oz (126.3 kg)   BMI 38.84 kg/m  BMI: Body mass index is 38.84 kg/m.  Physical Exam  Constitutional: He is oriented to person, place, and time. He appears well-developed and well-nourished.  HENT:  Head: Normocephalic and atraumatic.  Eyes: Right eye exhibits no discharge. Left eye exhibits no discharge.  Neck: Normal range of motion. No JVD present.  Cardiovascular: Normal rate, regular rhythm, S1 normal, S2 normal and normal heart sounds. Exam reveals no distant heart sounds, no friction rub, no midsystolic click and no opening snap.  No murmur heard. Pulses:      Posterior tibial pulses are 2+ on the right side, and 2+ on the left side.  Pulmonary/Chest: Effort normal and breath sounds normal. No respiratory distress. He has no decreased breath sounds. He has no wheezes. He has no rales. He exhibits no tenderness.  Abdominal: Soft. He exhibits no distension. There is no tenderness.  Musculoskeletal: He exhibits no edema.  Neurological: He is alert and oriented to person, place, and time.  Skin: Skin is warm and dry. No cyanosis. Nails show no clubbing.  Psychiatric: He has a normal mood and affect. His speech is normal and behavior is normal. Judgment and thought content normal.     EKG:  Was ordered and interpreted by me today. Shows NSR, 61 bpm, left axis deviation, low voltage QRS, lateral TWI  Recent Labs: 11/11/2016: TSH 19.060 11/15/2016: BUN 30; Creatinine, Ser 2.19; Hemoglobin 14.7; Platelets 203; Potassium 4.0; Sodium 136  No results found for requested labs within last 8760 hours.   CrCl cannot be calculated (Patient's most recent lab result is older than the maximum 21 days allowed.).   Wt Readings from Last 3 Encounters:  03/18/17 278 lb 8 oz (126.3 kg)  12/21/16 284 lb 4 oz (128.9 kg)  11/15/16 289 lb (131.1 kg)     Other studies reviewed: Additional studies/records reviewed today include: summarized above  ASSESSMENT AND  PLAN:  1. Chronic combined CHF/NICM: He does not appear grossly volume overloaded at this time.  Repeat echocardiogram in 11/2016 showed continued improvement in LV systolic function with an EF of 45-50%.  But he reports compliance with medications and dietary restrictions.  Weight continues to drop.  [facility] 1.5 hours each session without symptoms.  Tolerating advancement of Entresto and 10/2016 without issues.  We will check a bmet today given his AKI noted in the ED on 9/23/for acute gout visit.  If renal function is improved/stable consider further escalation of Entresto with  discontinuation of amlodipine to allow for adequate blood pressure room.  CHF education provided.  Otherwise, he will continue carvedilol and Lasix in addition to [place] as above.  2. PAF/flutter: Patient currently in sinus rhythm with well-controlled heart rate of 60 bpm.  He denies any symptoms of palpitations since his last visit.  He is compliant with his medications including amiodarone, Eliquis, and carvedilol.  Continue current medications.  3. Essential hypertension: Blood pressure is well controlled today.  Continue current medications as above.  4. Hypothyroidism: Follow-up with PCP.  5. Hyperlipidemia: Continue Lipitor.  Disposition: F/u with myself in 3 months.   Current medicines are reviewed at length with the patient today.  The patient did not have any concerns regarding medicines.  Signed, Christell Faith, PA-C 03/18/2017 12:01 PM     [place], [place] [address] [phone]

## 2017-03-18 ENCOUNTER — Ambulatory Visit (INDEPENDENT_AMBULATORY_CARE_PROVIDER_SITE_OTHER): Payer: Medicaid Other | Admitting: Physician Assistant

## 2017-03-18 ENCOUNTER — Encounter: Payer: Self-pay | Admitting: Physician Assistant

## 2017-03-18 VITALS — BP 110/80 | HR 60 | Ht 71.0 in | Wt 278.5 lb

## 2017-03-18 DIAGNOSIS — I480 Paroxysmal atrial fibrillation: Secondary | ICD-10-CM | POA: Diagnosis not present

## 2017-03-18 DIAGNOSIS — I4892 Unspecified atrial flutter: Secondary | ICD-10-CM

## 2017-03-18 DIAGNOSIS — I428 Other cardiomyopathies: Secondary | ICD-10-CM

## 2017-03-18 DIAGNOSIS — I5042 Chronic combined systolic (congestive) and diastolic (congestive) heart failure: Secondary | ICD-10-CM | POA: Diagnosis not present

## 2017-03-18 DIAGNOSIS — Z79899 Other long term (current) drug therapy: Secondary | ICD-10-CM | POA: Diagnosis not present

## 2017-03-18 DIAGNOSIS — I10 Essential (primary) hypertension: Secondary | ICD-10-CM

## 2017-03-18 DIAGNOSIS — E782 Mixed hyperlipidemia: Secondary | ICD-10-CM | POA: Diagnosis not present

## 2017-03-18 NOTE — Patient Instructions (Signed)
Medication Instructions:  We will call you with medication recommendations with your lab results.   Labwork: BMET today here in the office  Testing/Procedures: No testing at this time.  Follow-Up: Your physician recommends that you schedule a follow-up appointment in: 3 months here in our office.   It was a pleasure seeing you today here in the office. Please do not hesitate to give [place] a call back if you have any further questions. [place], BSN      Any Other Special Instructions Will Be Listed Below (If Applicable).     If you need a refill on your cardiac medications before your next appointment, please call your pharmacy.

## 2017-03-19 ENCOUNTER — Other Ambulatory Visit: Payer: Self-pay

## 2017-03-19 DIAGNOSIS — I5022 Chronic systolic (congestive) heart failure: Secondary | ICD-10-CM

## 2017-03-19 LAB — BASIC METABOLIC PANEL
BUN/Creatinine Ratio: 14 (ref 9–20)
BUN: 26 mg/dL — AB (ref 6–24)
CO2: 20 mmol/L (ref 20–29)
CREATININE: 1.89 mg/dL — AB (ref 0.76–1.27)
Calcium: 9.3 mg/dL (ref 8.7–10.2)
Chloride: 106 mmol/L (ref 96–106)
GFR calc Af Amer: 46 mL/min/{1.73_m2} — ABNORMAL LOW (ref >59)
GFR, EST NON AFRICAN AMERICAN: 40 mL/min/{1.73_m2} — AB (ref >59)
Glucose: 116 mg/dL — ABNORMAL HIGH (ref 65–99)
Potassium: 5.1 mmol/L (ref 3.5–5.2)
Sodium: 144 mmol/L (ref 134–144)

## 2017-03-19 MED ORDER — SACUBITRIL-VALSARTAN 97-103 MG PO TABS: 1.0000 | ORAL_TABLET | Freq: Two times a day (BID) | ORAL | 5 refills | Status: DC

## 2017-03-24 ENCOUNTER — Other Ambulatory Visit
Admission: RE | Admit: 2017-03-24 | Discharge: 2017-03-24 | Disposition: A | Discharge status: Home / Self Care | Payer: Medicaid Other | Source: Ambulatory Visit | Attending: Cardiovascular Disease | Admitting: Cardiovascular Disease

## 2017-03-24 DIAGNOSIS — I5022 Chronic systolic (congestive) heart failure: Secondary | ICD-10-CM | POA: Diagnosis not present

## 2017-03-24 LAB — BASIC METABOLIC PANEL WITH GFR
Anion gap: 6 (ref 5–15)
BUN: 27 mg/dL — ABNORMAL HIGH (ref 6–20)
CO2: 26 mmol/L (ref 22–32)
Calcium: 9.1 mg/dL (ref 8.9–10.3)
Chloride: 109 mmol/L (ref 101–111)
Creatinine, Ser: 1.83 mg/dL — ABNORMAL HIGH (ref 0.61–1.24)
GFR calc Af Amer: 47 mL/min — ABNORMAL LOW
GFR calc non Af Amer: 41 mL/min — ABNORMAL LOW
Glucose, Bld: 121 mg/dL — ABNORMAL HIGH (ref 65–99)
Potassium: 4.9 mmol/L (ref 3.5–5.1)
Sodium: 141 mmol/L (ref 135–145)

## 2017-05-31 ENCOUNTER — Other Ambulatory Visit: Payer: Self-pay

## 2017-05-31 MED ORDER — APIXABAN 5 MG PO TABS: 5.0000 mg | ORAL_TABLET | Freq: Two times a day (BID) | ORAL | 0 refills | Status: DC

## 2017-06-02 ENCOUNTER — Encounter: Payer: Self-pay | Admitting: Nurse Practitioner

## 2017-06-02 ENCOUNTER — Ambulatory Visit (INDEPENDENT_AMBULATORY_CARE_PROVIDER_SITE_OTHER): Payer: Medicaid Other | Admitting: Nurse Practitioner

## 2017-06-02 VITALS — BP 118/82 | HR 64 | Ht 71.0 in | Wt 284.0 lb

## 2017-06-02 DIAGNOSIS — I428 Other cardiomyopathies: Secondary | ICD-10-CM

## 2017-06-02 DIAGNOSIS — I10 Essential (primary) hypertension: Secondary | ICD-10-CM | POA: Diagnosis not present

## 2017-06-02 DIAGNOSIS — I480 Paroxysmal atrial fibrillation: Secondary | ICD-10-CM

## 2017-06-02 DIAGNOSIS — Z79899 Other long term (current) drug therapy: Secondary | ICD-10-CM

## 2017-06-02 DIAGNOSIS — I5042 Chronic combined systolic (congestive) and diastolic (congestive) heart failure: Secondary | ICD-10-CM

## 2017-06-02 NOTE — Progress Notes (Signed)
Office Visit    Patient Name: Hector [name] Date of Encounter: 06/02/2017  Primary Care Provider:  [name], MD Primary Cardiologist:  Prev seen by D. Ellyn Hack, MD/A. Ingal, MD/S. Caryl Comes, MD   Chief Complaint    52 year old male with a prior history of nonischemic cardiomyopathy, chronic combined heart failure, nonobstructive CAD, paroxysmal atrial fibrillation and flutter, hypertension, obesity, and alcohol abuse, who presents for follow-up.  Past Medical History    Past Medical History:  Diagnosis Date  . Alcohol abuse   . 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 12/2007 MV: EF 28%, no isch;  b. 8/12 Echo: EF 25-35%; c. 02/2014 Echo: EF 20-25%; d. 12/2014 Cath: minimal CAD; e. 01/2015 Echo: EF 50-55%;  d. 05/2016 Echo: EF 30-35%, diff HK, gr2 DD; e. 11/2016 Echo: EF 45-50%, diff HK.  . Chronic combined systolic (congestive) and diastolic (congestive) heart failure ([place])    a. 05/2016 Most recent Echo: EF 30-35%, diff HK, Gr2 DD, mild MR, mod dil LA; b. 11/2016 Echo: EF 45-50%, diff HK.  . CKD (chronic kidney disease), stage III ([place])   . Elevated troponin    Chronically elevated. - level was 0.17-0.10 during recent admission  . Essential hypertension   . GI bleed 11/2013  . Hyperlipidemia   . Pancreatitis   . Paroxysmal A-fib ([place])    a. new onset s/p unsuccessful TEE/DCCV on 08/16/2014; b. on amio/eliquis (CHA2DS2VASc = 2-3); c. reports 1-2 hrs of afib ~ q54mos.  . Paroxysmal atrial flutter ([place])    a. new onset 07/2014; b. s/p unsuccessful TEE/DCCV 08/16/2014; c. on apixaban.  . Sleep-disordered breathing    Has yet to have a sleep study   Past Surgical History:  Procedure Laterality Date  . CARDIAC CATHETERIZATION N/A 01/09/2015   Procedure: Left Heart Cath and Coronary Angiography;  Surgeon: [name], MD;  Location: [place] [facility];  Service: Cardiovascular;  Laterality: N/A;  . ELECTROPHYSIOLOGIC STUDY N/A 08/16/2014   Procedure: CARDIOVERSION;   Surgeon: [name], MD;  Location: [facility];  Service: Cardiovascular;  Laterality: N/A;  . FLEXIBLE SIGMOIDOSCOPY N/A 10/10/2015   Procedure: FLEXIBLE SIGMOIDOSCOPY;  Surgeon: [name], MD;  Location: [facility] ENDOSCOPY;  Service: Endoscopy;  Laterality: N/A;  . KNEE SURGERY Right   . NM MYOVIEW LTD  November 2011   No ischemia or infarction. EF 50-55% ( no improvement from 2009 Myoview EF of 28%  . TEE WITHOUT CARDIOVERSION N/A 08/16/2014   Procedure: TRANSESOPHAGEAL ECHOCARDIOGRAM (TEE);  Surgeon: [name], MD;  Location: [facility] ORS;  Service: Cardiovascular;  Laterality: N/A;    Allergies  Allergies  Allergen Reactions  . Esomeprazole Magnesium Other (See Comments)    Suspected interstitial nephritis 2018  . Eggs Or Egg-Derived Products Rash    History of Present Illness    52 year old male with the above complex past medical history including alcoholic/nonischemic cardiomyopathy dating back to 2009, hypertension, paroxysmal atrial fibrillation and flutter, chronic combined heart failure, sleep apnea, hypertension, obesity, stage III chronic kidney disease, hyperlipidemia, and alcohol abuse.  He was previously followed by Dr. Ellyn Hack and more recently Dr. Yvone Neu.  Cardiomyopathy was diagnosed in 2009.  Stress testing was nonischemic at that time and myopathy was felt to be secondary to alcohol.  He did quit drinking an EF varied over the years.  In 2016 he was diagnosed with atrial flutter and was cardioverted in June 2016.  He subsequently reverted to atrial fibrillation and was placed on  amiodarone and Eliquis and has remained on these therapies.  He was evaluated by electrophysiology in 2016 in the setting of LV dysfunction and catheterization at that time showed nonobstructive CAD.  Follow-up echo in December 2016 showed recovery of LV function and therefore ICD therapy was not indicated.  Follow-up echo in April 2018 showed recurrent LV dysfunction with an EF of 30-35%  diffuse hypokinesis in the setting of some noncompliance with medications.  He was placed back on Entresto and follow-up echo in October 2018 showed improvement in LV function to 45-50% with diffuse hypokinesis.    He was last seen in clinic in January and since then has done well.  He is walking up to 2 miles a day without any significant symptoms or limitations.  He is trying to watch his salt intake closely and weighs daily with his weight typically running about 282 at home.  He has gained weight, which she says is secondary to eating too much.  He has not had any chest pain, palpitations, dyspnea, PND, orthopnea, dizziness, syncope, edema, or early satiety.  He reports compliance with all medications.  Home Medications    Prior to Admission medications   Medication Sig Start Date End Date Taking? Authorizing Provider  amiodarone (PACERONE) 200 MG tablet Take 1 tablet (200 mg total) by mouth daily. 02/24/17  Yes Hackney, Aura Fey, FNP  apixaban (ELIQUIS) 5 MG TABS tablet Take 1 tablet (5 mg total) by mouth 2 (two) times daily. 05/31/17  Yes Theora Gianotti, NP  atorvastatin (LIPITOR) 80 MG tablet Take 1 tablet (80 mg total) by mouth daily. 12/04/15  Yes Alisa Graff, FNP  carvedilol (COREG) 3.125 MG tablet Take 1 tablet (3.125 mg total) by mouth 2 (two) times daily with a meal. 07/29/16  Yes [place]ique, Peter M, MD  docusate sodium (COLACE) 100 MG capsule Take 1 tablet once or twice daily as needed for constipation while taking narcotic pain medicine 11/15/16  Yes Hinda Kehr, MD  furosemide (LASIX) 40 MG tablet Take 1 tablet (40 mg total) by mouth daily. 07/29/16  Yes [place], Peter M, MD  HYDROcodone-acetaminophen (NORCO/VICODIN) 5-325 MG tablet Take 1-2 tablets by mouth every 6 (six) hours as needed for moderate pain. 11/15/16  Yes Hinda Kehr, MD  levothyroxine (SYNTHROID, LEVOTHROID) 100 MCG tablet Take 100 mcg by mouth daily before breakfast.   Yes [provider]    sacubitril-valsartan (ENTRESTO) 97-103 MG Take 1 tablet by mouth 2 (two) times daily. 03/19/17  Yes Wellington Hampshire, MD    Review of Systems    He denies chest pain, palpitations, dyspnea, pnd, orthopnea, n, v, dizziness, syncope, edema, weight gain, or early satiety.  All other systems reviewed and are otherwise negative except as noted above.  Physical Exam    VS:  BP 118/82 (BP Location: Left Arm, Patient Position: Sitting, Cuff Size: Large)   Pulse 64   Ht 5\' 11"  (1.803 m)   Wt 284 lb (128.8 kg)   BMI 39.61 kg/m  , BMI Body mass index is 39.61 kg/m. GEN: Well nourished, well developed, in no acute distress.  HEENT: normal.  Neck: Supple, no JVD, carotid bruits, or masses. Cardiac: RRR, no murmurs, rubs, or gallops. No clubbing, cyanosis, edema.  Radials/DP/PT 2+ and equal bilaterally.  Respiratory:  Respirations regular and unlabored, clear to auscultation bilaterally. GI: Obese, soft, nontender, nondistended, BS + x 4. MS: no deformity or atrophy. Skin: warm and dry, no rash. Neuro:  Strength and sensation are intact.  Psych: Normal affect.  Accessory Clinical Findings    ECG -regular sinus rhythm, 64, left axis deviation, left anterior fascicular block, PAC, lateral T wave inversion.  No acute changes.  Assessment & Plan    1.  Nonischemic cardiomyopathy/chronic combined systolic and diastolic congestive heart failure: Patient has been doing well since his last visit in January.  He has been walking regularly without symptoms or limitations.  Echo in October showed improvement in LV function to 45-50%.  His weight is up however he says this is because of eating too much.  He has not had any significant issues with volume.  We discussed the importance of daily weights, sodium restriction, medication compliance, and symptom reporting and he verbalizes understanding.  He remains on beta-blocker, Lasix and Entresto therapy.   2.  Paroxysmal atrial fibrillation/flutter:  Maintaining sinus rhythm on amiodarone.  He has not had any recent palpitations.  He is also anticoagulated with Eliquis.  I note that he has not had LFTs in some time and has never had PFTs.  We will arrange for both these today.  I will also follow-up a CBC in the setting of ongoing anticoagulation.  3.  Essential hypertension: Stable on beta-blocker and Entresto therapy.  4.  Hypothyroidism: This is followed by primary care.  TSH was recently evaluated and was within normal limits.  5.  Hyperlipidemia: He remains on statin therapy.  Following up LFTs today.  6.  OSA: uses CPAP.  7.  Disposition: Follow-up complete metabolic panel and CBC today.  We will arrange for outpatient pulmonary function testing.  Follow-up in clinic in 3-4 months.  Murray Hodgkins, NP 06/02/2017, 11:00 AM

## 2017-06-02 NOTE — Patient Instructions (Signed)
Medication Instructions: - Your physician recommends that you continue on your current medications as directed. Please refer to the Current Medication list given to you today.  Labwork: - Your physician recommends that you have lab work today: CMET/ TSH/ CBC  Procedures/Testing: - Your physician has recommended that you have a pulmonary function test. Pulmonary Function Tests are a group of tests that measure how well air moves in and out of your lungs.  Follow-Up: - Your physician recommends that you schedule a follow-up appointment in: 4-6 months with Dr. Rockey Situ.   Any Additional Special Instructions Will Be Listed Below (If Applicable).     If you need a refill on your cardiac medications before your next appointment, please call your pharmacy.

## 2017-06-03 LAB — COMPREHENSIVE METABOLIC PANEL
A/G RATIO: 1.5 (ref 1.2–2.2)
ALK PHOS: 76 IU/L (ref 39–117)
ALT: 16 IU/L (ref 0–44)
AST: 21 IU/L (ref 0–40)
Albumin: 4.2 g/dL (ref 3.5–5.5)
BILIRUBIN TOTAL: 0.5 mg/dL (ref 0.0–1.2)
BUN/Creatinine Ratio: 12 (ref 9–20)
BUN: 26 mg/dL — AB (ref 6–24)
CHLORIDE: 107 mmol/L — AB (ref 96–106)
CO2: 19 mmol/L — ABNORMAL LOW (ref 20–29)
Calcium: 9.4 mg/dL (ref 8.7–10.2)
Creatinine, Ser: 2.1 mg/dL — ABNORMAL HIGH (ref 0.76–1.27)
GFR calc non Af Amer: 35 mL/min/{1.73_m2} — ABNORMAL LOW (ref >59)
GFR, EST AFRICAN AMERICAN: 41 mL/min/{1.73_m2} — AB (ref >59)
GLUCOSE: 114 mg/dL — AB (ref 65–99)
Globulin, Total: 2.8 g/dL (ref 1.5–4.5)
POTASSIUM: 5.1 mmol/L (ref 3.5–5.2)
Sodium: 143 mmol/L (ref 134–144)
TOTAL PROTEIN: 7 g/dL (ref 6.0–8.5)

## 2017-06-03 LAB — CBC WITH DIFFERENTIAL/PLATELET
BASOS ABS: 0.1 10*3/uL (ref 0.0–0.2)
Basos: 1 %
EOS (ABSOLUTE): 0.2 10*3/uL (ref 0.0–0.4)
Eos: 3 %
Hematocrit: 45.2 % (ref 37.5–51.0)
Hemoglobin: 14.9 g/dL (ref 13.0–17.7)
IMMATURE GRANS (ABS): 0 10*3/uL (ref 0.0–0.1)
Immature Granulocytes: 1 %
LYMPHS: 24 %
Lymphocytes Absolute: 1.6 10*3/uL (ref 0.7–3.1)
MCH: 28.4 pg (ref 26.6–33.0)
MCHC: 33 g/dL (ref 31.5–35.7)
MCV: 86 fL (ref 79–97)
Monocytes Absolute: 0.7 10*3/uL (ref 0.1–0.9)
Monocytes: 11 %
NEUTROS ABS: 4.3 10*3/uL (ref 1.4–7.0)
Neutrophils: 60 %
PLATELETS: 221 10*3/uL (ref 150–379)
RBC: 5.25 x10E6/uL (ref 4.14–5.80)
RDW: 15.4 % (ref 12.3–15.4)
WBC: 6.9 10*3/uL (ref 3.4–10.8)

## 2017-06-08 ENCOUNTER — Ambulatory Visit: Payer: Medicaid Other

## 2017-06-19 NOTE — Progress Notes (Deleted)
[name], male    DOB: [birth year], 52 y.o.   MRN: [id number]  HPI  Hector [name] is a 52 y/o male with a history of MI, hyperlipidemia, HTN, CKD, atrial fibrillation, alcohol use, cardiomyopathy, and CHF.   Echo done 12/03/16 showed an EF of 45-50% which is an improvement from previous echo which was done on 06/02/16 and showed an EF of 30-35%. Prior echo on 02/21/15 which showed a normalization of his EF which was 50-55% with mild Hector. No longer ICD candidate. Prior echo on 03/07/14 showed an EF of 20-25% with mild/mod Hector. He was started on [facility] Feb 2016. Last left heart cath was done 01/09/15.   Has not been in the ED or admitted in the last 6 months.      He presents today for a follow-up visit with a chief complaint of   Past Medical History:  Diagnosis Date  . Alcohol abuse   . 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 12/2007 MV: EF 28%, no isch;  b. 8/12 Echo: EF 25-35%; c. 02/2014 Echo: EF 20-25%; d. 12/2014 Cath: minimal CAD; e. 01/2015 Echo: EF 50-55%;  d. 05/2016 Echo: EF 30-35%, diff HK, gr2 DD; e. 11/2016 Echo: EF 45-50%, diff HK.  . Chronic combined systolic (congestive) and diastolic (congestive) heart failure ([place])    a. 05/2016 Most recent Echo: EF 30-35%, diff HK, Gr2 DD, mild Hector, mod dil LA; b. 11/2016 Echo: EF 45-50%, diff HK.  . CKD (chronic kidney disease), stage III ([place])   . Elevated troponin    Chronically elevated. - level was 0.17-0.10 during recent admission  . Essential hypertension   . GI bleed 11/2013  . Hyperlipidemia   . Pancreatitis   . Paroxysmal A-fib ([place])    a. new onset s/p unsuccessful TEE/DCCV on 08/16/2014; b. on amio/eliquis (CHA2DS2VASc = 2-3); c. reports 1-2 hrs of afib ~ q3mos.  . Paroxysmal atrial flutter ([place])    a. new onset 07/2014; b. s/p unsuccessful TEE/DCCV 08/16/2014; c. on apixaban.  . Sleep-disordered breathing    Has yet to have a sleep study   Past Surgical History:  Procedure Laterality Date  . CARDIAC  CATHETERIZATION N/A 01/09/2015   Procedure: Left Heart Cath and Coronary Angiography;  Surgeon: [name], MD;  Location: [place] [facility];  Service: Cardiovascular;  Laterality: N/A;  . ELECTROPHYSIOLOGIC STUDY N/A 08/16/2014   Procedure: CARDIOVERSION;  Surgeon: [name], MD;  Location: [facility] ORS;  Service: Cardiovascular;  Laterality: N/A;  . FLEXIBLE SIGMOIDOSCOPY N/A 10/10/2015   Procedure: FLEXIBLE SIGMOIDOSCOPY;  Surgeon: [name], MD;  Location: [facility] ENDOSCOPY;  Service: Endoscopy;  Laterality: N/A;  . KNEE SURGERY Right   . NM MYOVIEW LTD  November 2011   No ischemia or infarction. EF 50-55% ( no improvement from 2009 Myoview EF of 28%  . TEE WITHOUT CARDIOVERSION N/A 08/16/2014   Procedure: TRANSESOPHAGEAL ECHOCARDIOGRAM (TEE);  Surgeon: [name], MD;  Location: [facility] ORS;  Service: Cardiovascular;  Laterality: N/A;   Family History  Problem Relation Age of Onset  . Hypertension Mother   . Hyperlipidemia Mother   . Diabetes Mother    Social History   Tobacco Use  . Smoking status: Former Smoker    Packs/day: 1.00    Years: 12.00    Pack years: 12.00    Last attempt to quit: 02/23/1998    Years since quitting: 19.3  . Smokeless tobacco: Never Used  Substance Use Topics  .  Alcohol use: No    Alcohol/week: 0.0 oz    Comment: 4 heavy alcohol drinker. Quit 2 years ago.   Allergies  Allergen Reactions  . Esomeprazole Magnesium Other (See Comments)    Suspected interstitial nephritis 2018  . Eggs Or Egg-Derived Products Rash    Review of Systems  Constitutional: Positive for fatigue (minimal). Negative for appetite change.  HENT: Negative for congestion, postnasal drip and sore throat.   Eyes: Negative.   Respiratory: Negative for chest tightness, shortness of breath and wheezing.   Cardiovascular: Negative for chest pain, palpitations and leg swelling.  Gastrointestinal: Negative for abdominal distention and abdominal pain.  Endocrine:  Negative.   Genitourinary: Negative.   Musculoskeletal: Negative for back pain and neck pain.  Skin: Negative.   Allergic/Immunologic: Negative.   Neurological: Negative for dizziness and light-headedness.  Hematological: Negative for adenopathy. Does not bruise/bleed easily.  Psychiatric/Behavioral: Negative for dysphoric mood and sleep disturbance (wearing CPAP nightly). The patient is not nervous/anxious.     Physical Exam  Constitutional: He is oriented to person, place, and time. He appears well-developed and well-nourished.  HENT:  Head: Normocephalic and atraumatic.  Neck: Normal range of motion. Neck supple. No JVD present.  Cardiovascular: Normal rate and regular rhythm.  Pulmonary/Chest: Effort normal. He has no wheezes. He has no rales.  Abdominal: Soft. He exhibits no distension. There is no tenderness.  Musculoskeletal: He exhibits no edema or tenderness.  Neurological: He is alert and oriented to person, place, and time.  Skin: Skin is warm and dry.  Psychiatric: He has a normal mood and affect. His behavior is normal. Thought content normal.  Nursing note and vitals reviewed.    Assessment & Plan:  1: Chronic heart failure with preserved ejection fraction- - NYHA Class II - euvolemic today - weight down another 6 pounds from last visit here. Reminded to call for an overnight weight gain of >2 pounds or a weekly weight gain of >5 pounds - since February 2018, he has lost 16 pounds - is planning on joining the [facility] for exercise - not adding salt. Reminded to keep salt under 2000 mg in a day -  - EF has improved so no indication for ICD at this time - saw cardiology Sharolyn Douglas) 06/02/17 - walking to work (1/4 mile one way) and eating less - BMP from 06/02/17 reviewed and shows sodium 143, potassium 5.1 and GFR 41 - PharmD reviewed medications with the patient  2: HTN- - BP looks good today - max dose amlodipine, low dose carvedilol due to HR, and furosemide 40 mg  daily - sees nephrologist [facility]) November 2018  3: Atrial fibrillation- - currently rate controlled  - taking amiodarone, apixaban and carvedilol  4: Obstructive sleep apnea- - sleeping well at night with CPAP machine - wears it 4-5 hours nightly at least 3 times/week; says that he doesn't wear it nightly because it "irritates him" - does admit that he rests better on the nights that he wears it; encouraged him to wear it nightly   Patient did not bring his medications nor a list. Each medication was verbally reviewed with the patient and he was encouraged to bring the bottles to every visit to confirm accuracy of list.

## 2017-06-21 ENCOUNTER — Ambulatory Visit: Payer: Medicaid Other | Admitting: Family

## 2017-06-21 ENCOUNTER — Telehealth: Payer: Self-pay | Admitting: Family

## 2017-06-21 NOTE — Telephone Encounter (Signed)
Patient did not show for his [facility] appointment on 06/21/17. Will attempt to reschedule.

## 2017-06-29 ENCOUNTER — Ambulatory Visit: Payer: Medicaid Other | Attending: Cardiology

## 2017-07-12 ENCOUNTER — Ambulatory Visit: Payer: Medicaid Other | Admitting: Cardiovascular Disease

## 2017-07-19 NOTE — Progress Notes (Signed)
[name], male    DOB: [birth year], 52 y.o.   MRN: [id number]  HPI  Hector [name] is a 52 y/o male with a history of MI, hyperlipidemia, HTN, CKD, atrial fibrillation, alcohol use, cardiomyopathy, and CHF.   Echo done 12/03/16 showed an EF of 45-50% which is an improvement from previous echo which was done on 06/02/16 and showed an EF of 30-35%. Prior echo on 02/21/15 which showed a normalization of his EF which was 50-55% with mild Hector. No longer ICD candidate. Prior echo on 03/07/14 showed an EF of 20-25% with mild/mod Hector. He was started on [facility] Feb 2016. Last left heart cath was done 01/09/15.   Has not been in the ED or admitted in the last 6 months.      He presents today with a chief complaint of a follow-up visit. He currently denies any fatigue, shortness of breath, chest pain, pedal edema, palpitations, abdominal distention, dizziness or difficulty sleeping. Has gained a few pounds but says that it's because he's not exercising much. Has not taken his amiodarone in the last month.   Past Medical History:  Diagnosis Date  . Alcohol abuse   . 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 12/2007 MV: EF 28%, no isch;  b. 8/12 Echo: EF 25-35%; c. 02/2014 Echo: EF 20-25%; d. 12/2014 Cath: minimal CAD; e. 01/2015 Echo: EF 50-55%;  d. 05/2016 Echo: EF 30-35%, diff HK, gr2 DD; e. 11/2016 Echo: EF 45-50%, diff HK.  . Chronic combined systolic (congestive) and diastolic (congestive) heart failure ([place])    a. 05/2016 Most recent Echo: EF 30-35%, diff HK, Gr2 DD, mild Hector, mod dil LA; b. 11/2016 Echo: EF 45-50%, diff HK.  . CKD (chronic kidney disease), stage III ([place])   . Elevated troponin    Chronically elevated. - level was 0.17-0.10 during recent admission  . Essential hypertension   . GI bleed 11/2013  . Hyperlipidemia   . Pancreatitis   . Paroxysmal A-fib ([place])    a. new onset s/p unsuccessful TEE/DCCV on 08/16/2014; b. on amio/eliquis (CHA2DS2VASc = 2-3); c. reports 1-2  hrs of afib ~ q26mos.  . Paroxysmal atrial flutter ([place])    a. new onset 07/2014; b. s/p unsuccessful TEE/DCCV 08/16/2014; c. on apixaban.  . Sleep-disordered breathing    Has yet to have a sleep study   Past Surgical History:  Procedure Laterality Date  . CARDIAC CATHETERIZATION N/A 01/09/2015   Procedure: Left Heart Cath and Coronary Angiography;  Surgeon: [name], MD;  Location: [place];  Service: Cardiovascular;  Laterality: N/A;  . ELECTROPHYSIOLOGIC STUDY N/A 08/16/2014   Procedure: CARDIOVERSION;  Surgeon: [name], MD;  Location: [facility] ORS;  Service: Cardiovascular;  Laterality: N/A;  . FLEXIBLE SIGMOIDOSCOPY N/A 10/10/2015   Procedure: FLEXIBLE SIGMOIDOSCOPY;  Surgeon: [name], MD;  Location: [facility] ENDOSCOPY;  Service: Endoscopy;  Laterality: N/A;  . KNEE SURGERY Right   . NM MYOVIEW LTD  November 2011   No ischemia or infarction. EF 50-55% ( no improvement from 2009 Myoview EF of 28%  . TEE WITHOUT CARDIOVERSION N/A 08/16/2014   Procedure: TRANSESOPHAGEAL ECHOCARDIOGRAM (TEE);  Surgeon: [name], MD;  Location: [facility] ORS;  Service: Cardiovascular;  Laterality: N/A;   Family History  Problem Relation Age of Onset  . Hypertension Mother   . Hyperlipidemia Mother   . Diabetes Mother    Social History   Tobacco Use  . Smoking status: Former Smoker  Packs/day: 1.00    Years: 12.00    Pack years: 12.00    Last attempt to quit: 02/23/1998    Years since quitting: 19.4  . Smokeless tobacco: Never Used  Substance Use Topics  . Alcohol use: No    Alcohol/week: 0.0 oz    Comment: 4 heavy alcohol drinker. Quit 2 years ago.   Allergies  Allergen Reactions  . Esomeprazole Magnesium Other (See Comments)    Suspected interstitial nephritis 2018  . Eggs Or Egg-Derived Products Rash   Prior to Admission medications   Medication Sig Start Date End Date Taking? Authorizing Provider  apixaban (ELIQUIS) 5 MG TABS tablet Take 1 tablet (5 mg  total) by mouth 2 (two) times daily. 05/31/17  Yes Theora Gianotti, NP  atorvastatin (LIPITOR) 80 MG tablet Take 1 tablet (80 mg total) by mouth daily. 12/04/15  Yes Alisa Graff, FNP  carvedilol (COREG) 3.125 MG tablet Take 1 tablet (3.125 mg total) by mouth 2 (two) times daily with a meal. 07/29/16  Yes Martinique, Peter M, MD  furosemide (LASIX) 40 MG tablet Take 1 tablet (40 mg total) by mouth daily. 07/29/16  Yes Martinique, Peter M, MD  levothyroxine (SYNTHROID, LEVOTHROID) 100 MCG tablet Take 100 mcg by mouth daily before breakfast.   Yes [provider]  sacubitril-valsartan (ENTRESTO) 97-103 MG Take 1 tablet by mouth 2 (two) times daily. 03/19/17  Yes Wellington Hampshire, MD  amiodarone (PACERONE) 200 MG tablet Take 1 tablet (200 mg total) by mouth daily. 02/24/17   Alisa Graff, FNP    Review of Systems  Constitutional: Negative for appetite change and fatigue.  HENT: Negative for congestion, postnasal drip and sore throat.   Eyes: Negative.   Respiratory: Negative for chest tightness, shortness of breath and wheezing.   Cardiovascular: Negative for chest pain, palpitations and leg swelling.  Gastrointestinal: Negative for abdominal distention and abdominal pain.  Endocrine: Negative.   Genitourinary: Negative.   Musculoskeletal: Negative for back pain and neck pain.  Skin: Negative.   Allergic/Immunologic: Negative.   Neurological: Negative for dizziness and light-headedness.  Hematological: Negative for adenopathy. Does not bruise/bleed easily.  Psychiatric/Behavioral: Negative for dysphoric mood and sleep disturbance (wearing CPAP nightly). The patient is not nervous/anxious.    Vitals:   07/20/17 1033  BP: 121/87  Pulse: 73  Resp: 18  SpO2: 95%  Weight: 288 lb 2 oz (130.7 kg)  Height: 5\' 11"  (1.803 m)   Wt Readings from Last 3 Encounters:  07/20/17 288 lb 2 oz (130.7 kg)  06/02/17 284 lb (128.8 kg)  03/18/17 278 lb 8 oz (126.3 kg)   Lab Results  Component  Value Date   CREATININE 2.10 (H) 06/02/2017   CREATININE 1.83 (H) 03/24/2017   CREATININE 1.89 (H) 03/18/2017    Physical Exam  Constitutional: He is oriented to person, place, and time. He appears well-developed and well-nourished.  HENT:  Head: Normocephalic and atraumatic.  Neck: Normal range of motion. Neck supple. No JVD present.  Cardiovascular: Normal rate and regular rhythm.  Pulmonary/Chest: Effort normal. He has no wheezes. He has no rales.  Abdominal: Soft. He exhibits no distension. There is no tenderness.  Musculoskeletal: He exhibits no edema or tenderness.  Neurological: He is alert and oriented to person, place, and time.  Skin: Skin is warm and dry.  Psychiatric: He has a normal mood and affect. His behavior is normal. Thought content normal.  Nursing note and vitals reviewed.    Assessment & Plan:  1: Chronic heart failure with preserved ejection fraction- - NYHA Class I - euvolemic today - weight up 4 pounds from last visit here. Reminded to call for an overnight weight gain of >2 pounds or a weekly weight gain of >5 pounds - has joined [employer] and goes weekly; encouraged him to go more often - not adding salt. Reminded to keep salt under 2000 mg in a day - EF has improved so no indication for ICD at this time - saw cardiology Sharolyn Douglas) 06/02/17 - walking to work (1/4 mile one way)  - BMP from 06/02/17 reviewed and shows sodium 143, potassium 5.1 and GFR 41 - PharmD reviewed medications with the patient  2: HTN- - BP looks good today - low dose carvedilol due to HR, and furosemide 40 mg daily - saw nephrologist [facility]) 07/15/17  3: Atrial fibrillation- - currently rate controlled  - taking apixaban and carvedilol; says that he stopped amiodarone ~ 1 month ago because "someone told him to" - message sent to Dr. Saunders Revel regarding amiodarone  4: Obstructive sleep apnea- - sleeping well at night with CPAP machine - wears it 4-5 hours nightly at least 3 times/week;  says that he doesn't wear it nightly because it "irritates him" - does admit that he rests better on the nights that he wears it; encouraged him to wear it nightly  Patient did not bring his medications nor a list. Each medication was verbally reviewed with the patient and he was encouraged to bring the bottles to every visit to confirm accuracy of list.  Return in 6 months or sooner for any questions/problems before then.

## 2017-07-20 ENCOUNTER — Encounter: Payer: Self-pay | Admitting: Family

## 2017-07-20 ENCOUNTER — Ambulatory Visit: Payer: Medicaid Other | Attending: Family | Admitting: Family

## 2017-07-20 VITALS — BP 121/87 | HR 73 | Resp 18 | Ht 71.0 in | Wt 288.1 lb

## 2017-07-20 DIAGNOSIS — N183 Chronic kidney disease, stage 3 (moderate): Secondary | ICD-10-CM | POA: Insufficient documentation

## 2017-07-20 DIAGNOSIS — Z9889 Other specified postprocedural states: Secondary | ICD-10-CM | POA: Diagnosis not present

## 2017-07-20 DIAGNOSIS — I130 Hypertensive heart and chronic kidney disease with heart failure and stage 1 through stage 4 chronic kidney disease, or unspecified chronic kidney disease: Secondary | ICD-10-CM | POA: Insufficient documentation

## 2017-07-20 DIAGNOSIS — E785 Hyperlipidemia, unspecified: Secondary | ICD-10-CM | POA: Insufficient documentation

## 2017-07-20 DIAGNOSIS — I10 Essential (primary) hypertension: Secondary | ICD-10-CM

## 2017-07-20 DIAGNOSIS — I509 Heart failure, unspecified: Secondary | ICD-10-CM | POA: Insufficient documentation

## 2017-07-20 DIAGNOSIS — Z87891 Personal history of nicotine dependence: Secondary | ICD-10-CM | POA: Insufficient documentation

## 2017-07-20 DIAGNOSIS — Z8249 Family history of ischemic heart disease and other diseases of the circulatory system: Secondary | ICD-10-CM | POA: Insufficient documentation

## 2017-07-20 DIAGNOSIS — Z79899 Other long term (current) drug therapy: Secondary | ICD-10-CM | POA: Diagnosis not present

## 2017-07-20 DIAGNOSIS — I4891 Unspecified atrial fibrillation: Secondary | ICD-10-CM | POA: Insufficient documentation

## 2017-07-20 DIAGNOSIS — G4733 Obstructive sleep apnea (adult) (pediatric): Secondary | ICD-10-CM | POA: Diagnosis not present

## 2017-07-20 DIAGNOSIS — I4892 Unspecified atrial flutter: Secondary | ICD-10-CM | POA: Insufficient documentation

## 2017-07-20 DIAGNOSIS — I5032 Chronic diastolic (congestive) heart failure: Secondary | ICD-10-CM

## 2017-07-20 DIAGNOSIS — I480 Paroxysmal atrial fibrillation: Secondary | ICD-10-CM

## 2017-07-20 NOTE — Patient Instructions (Signed)
Continue weighing daily and call for an overnight weight gain of > 2 pounds or a weekly weight gain of >5 pounds. 

## 2017-07-23 ENCOUNTER — Other Ambulatory Visit: Payer: Self-pay | Admitting: Family

## 2017-07-23 DIAGNOSIS — I4892 Unspecified atrial flutter: Secondary | ICD-10-CM

## 2017-07-23 MED ORDER — AMIODARONE HCL 200 MG PO TABS: 200.0000 mg | ORAL_TABLET | Freq: Every day | ORAL | 5 refills | Status: DC

## 2017-07-23 NOTE — Progress Notes (Signed)
Per Ignacia Bayley, NP in cardiology, resume amiodarone 200mg  once daily.

## 2017-07-29 ENCOUNTER — Telehealth: Payer: Self-pay | Admitting: Cardiovascular Disease

## 2017-07-29 NOTE — Telephone Encounter (Signed)
Fax received from [place] stating the patient needs to have a Prior Authorization for [employer].  I called [place] [employer] at [phone] & was advised that since the [facility] PA is for a continuation of therapy, I would need to fill out the [employer] tracks PA form online and then submit this with clinical documentation indicating the patient is having clinical benefit from the entresto.   The PA form was completed and faxed with the patient's last 2 echo reports, labs from 03/18/16 where the medication change originated and last 2 office notes to [phone].  Confirmation received.

## 2017-07-30 NOTE — Telephone Encounter (Signed)
I called [name] to follow up on the status of the patient's PA for his entresto. I was advised this has been approved from 07/29/17-07/24/18. Authorization #- [id number]  I left a message at the [place] at [phone] that the PA has been approved.

## 2017-09-17 ENCOUNTER — Emergency Department
Admission: EM | Admit: 2017-09-17 | Discharge: 2017-09-17 | Disposition: A | Discharge status: Home / Self Care | Payer: Medicaid Other | Attending: Emergency Medicine | Admitting: Emergency Medicine

## 2017-09-17 ENCOUNTER — Encounter: Payer: Self-pay | Admitting: Emergency Medicine

## 2017-09-17 ENCOUNTER — Other Ambulatory Visit: Payer: Self-pay

## 2017-09-17 DIAGNOSIS — M5442 Lumbago with sciatica, left side: Secondary | ICD-10-CM | POA: Diagnosis not present

## 2017-09-17 DIAGNOSIS — N183 Chronic kidney disease, stage 3 (moderate): Secondary | ICD-10-CM | POA: Diagnosis not present

## 2017-09-17 DIAGNOSIS — M545 Low back pain: Secondary | ICD-10-CM | POA: Diagnosis present

## 2017-09-17 DIAGNOSIS — Z79899 Other long term (current) drug therapy: Secondary | ICD-10-CM | POA: Insufficient documentation

## 2017-09-17 DIAGNOSIS — I5042 Chronic combined systolic (congestive) and diastolic (congestive) heart failure: Secondary | ICD-10-CM | POA: Insufficient documentation

## 2017-09-17 DIAGNOSIS — I130 Hypertensive heart and chronic kidney disease with heart failure and stage 1 through stage 4 chronic kidney disease, or unspecified chronic kidney disease: Secondary | ICD-10-CM | POA: Insufficient documentation

## 2017-09-17 DIAGNOSIS — Z87891 Personal history of nicotine dependence: Secondary | ICD-10-CM | POA: Diagnosis not present

## 2017-09-17 MED ORDER — METHOCARBAMOL 500 MG PO TABS: 500.0000 mg | ORAL_TABLET | Freq: Four times a day (QID) | ORAL | 0 refills | Status: DC

## 2017-09-17 MED ORDER — PREDNISONE 50 MG PO TABS: 50.0000 mg | ORAL_TABLET | Freq: Every day | ORAL | 0 refills | Status: DC

## 2017-09-17 MED ORDER — MELOXICAM 15 MG PO TABS: 15.0000 mg | ORAL_TABLET | Freq: Every day | ORAL | 0 refills | Status: DC

## 2017-09-17 MED ORDER — KETOROLAC TROMETHAMINE 30 MG/ML IJ SOLN: 30.0000 mg | Freq: Once | INTRAMUSCULAR | Status: DC

## 2017-09-17 MED ORDER — ORPHENADRINE CITRATE 30 MG/ML IJ SOLN
60.0000 mg | Freq: Once | INTRAMUSCULAR | Status: AC
  Administered 2017-09-17: 60 mg via INTRAMUSCULAR
  Filled 2017-09-17: qty 2

## 2017-09-17 MED ORDER — DEXAMETHASONE SODIUM PHOSPHATE 10 MG/ML IJ SOLN
10.0000 mg | Freq: Once | INTRAMUSCULAR | Status: AC
  Administered 2017-09-17: 10 mg via INTRAMUSCULAR

## 2017-09-17 MED ORDER — DEXAMETHASONE SODIUM PHOSPHATE 10 MG/ML IJ SOLN
INTRAMUSCULAR | Status: AC
  Filled 2017-09-17: qty 1

## 2017-09-17 NOTE — ED Triage Notes (Signed)
Low back pain radiating to L leg since yesterday. Denies fall or injury.

## 2017-09-17 NOTE — ED Provider Notes (Signed)
[facility] Emergency Department Provider Note  ____________________________________________  Time seen: Approximately 9:59 PM  I have reviewed the triage vital signs and the nursing notes.   HISTORY  Chief Complaint Back Pain    HPI Hector [name] is a 52 y.o. male presents the emergency department complaining of lower back pain with radiation into his left leg.  Patient reports that he was lifting heavy boxes at work yesterday, felt like something pulled in his lower back.  Patient finished his shift, went home and reports that area started aching and felt tight.  This morning he woke up with pain radiating into his left hip area.  No history of chronic back problems.  No direct trauma to the area.  Patient denies any hematuria, dysuria, polyuria.  No medications for this complaint prior to arrival.  No history of sciatica.  Patient denies any saddle anesthesia, paresthesias, bowel or bladder dysfunction.   Past Medical History:  Diagnosis Date  . Alcohol abuse   . 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 12/2007 MV: EF 28%, no isch;  b. 8/12 Echo: EF 25-35%; c. 02/2014 Echo: EF 20-25%; d. 12/2014 Cath: minimal CAD; e. 01/2015 Echo: EF 50-55%;  d. 05/2016 Echo: EF 30-35%, diff HK, gr2 DD; e. 11/2016 Echo: EF 45-50%, diff HK.  . Chronic combined systolic (congestive) and diastolic (congestive) heart failure ([place])    a. 05/2016 Most recent Echo: EF 30-35%, diff HK, Gr2 DD, mild MR, mod dil LA; b. 11/2016 Echo: EF 45-50%, diff HK.  . CKD (chronic kidney disease), stage III ([place])   . Elevated troponin    Chronically elevated. - level was 0.17-0.10 during recent admission  . Essential hypertension   . GI bleed 11/2013  . Hyperlipidemia   . Pancreatitis   . Paroxysmal A-fib ([place])    a. new onset s/p unsuccessful TEE/DCCV on 08/16/2014; b. on amio/eliquis (CHA2DS2VASc = 2-3); c. reports 1-2 hrs of afib ~ q71mos.  . Paroxysmal atrial flutter ([place])    a. new onset  07/2014; b. s/p unsuccessful TEE/DCCV 08/16/2014; c. on apixaban.  . Sleep-disordered breathing    Has yet to have a sleep study    Patient Active Problem List   Diagnosis Date Noted  . Obstructive sleep apnea 04/21/2016  . Nonischemic cardiomyopathy ([place]) 05/26/2015  . Chronic diastolic heart failure ([place]) 04/01/2015  . Essential hypertension 04/01/2015  . Sinus bradycardia 01/29/2015  . Paroxysmal atrial fibrillation ([place]) 08/16/2014  . Paroxysmal atrial flutter ([place]) 08/14/2014    Class: Acute  . Chronic kidney disease (CKD), stage III (moderate) ([place]) 07/02/2014  . Hypertensive heart disease   . Hyperlipidemia     Past Surgical History:  Procedure Laterality Date  . CARDIAC CATHETERIZATION N/A 01/09/2015   Procedure: Left Heart Cath and Coronary Angiography;  Surgeon: [name], MD;  Location: [place] CV LAB;  Service: Cardiovascular;  Laterality: N/A;  . ELECTROPHYSIOLOGIC STUDY N/A 08/16/2014   Procedure: CARDIOVERSION;  Surgeon: [name], MD;  Location: [facility] ORS;  Service: Cardiovascular;  Laterality: N/A;  . FLEXIBLE SIGMOIDOSCOPY N/A 10/10/2015   Procedure: FLEXIBLE SIGMOIDOSCOPY;  Surgeon: [name], MD;  Location: [facility] ENDOSCOPY;  Service: Endoscopy;  Laterality: N/A;  . KNEE SURGERY Right   . NM MYOVIEW LTD  November 2011   No ischemia or infarction. EF 50-55% ( no improvement from 2009 Myoview EF of 28%  . TEE WITHOUT CARDIOVERSION N/A 08/16/2014   Procedure: TRANSESOPHAGEAL ECHOCARDIOGRAM (TEE);  Surgeon: [name],  MD;  Location: [facility] ORS;  Service: Cardiovascular;  Laterality: N/A;    Prior to Admission medications   Medication Sig Start Date End Date Taking? Authorizing Provider  allopurinol (ZYLOPRIM) 100 MG tablet Take 100 mg by mouth daily as needed.   Yes [provider]  amiodarone (PACERONE) 200 MG tablet Take 1 tablet (200 mg total) by mouth daily. 07/23/17   Alisa Graff, FNP  apixaban (ELIQUIS) 5 MG TABS tablet  Take 1 tablet (5 mg total) by mouth 2 (two) times daily. 05/31/17   Theora Gianotti, NP  atorvastatin (LIPITOR) 80 MG tablet Take 1 tablet (80 mg total) by mouth daily. 12/04/15   Alisa Graff, FNP  carvedilol (COREG) 3.125 MG tablet Take 1 tablet (3.125 mg total) by mouth 2 (two) times daily with a meal. 07/29/16   Martinique, Peter M, MD  furosemide (LASIX) 40 MG tablet Take 1 tablet (40 mg total) by mouth daily. 07/29/16   [place], Peter M, MD  levothyroxine (SYNTHROID, LEVOTHROID) 100 MCG tablet Take 100 mcg by mouth daily before breakfast.    [provider]  methocarbamol (ROBAXIN) 500 MG tablet Take 1 tablet (500 mg total) by mouth 4 (four) times daily. 09/17/17   Saron Tweed, Charline Bills, PA-C  predniSONE (DELTASONE) 50 MG tablet Take 1 tablet (50 mg total) by mouth daily with breakfast. 09/17/17   Ameen Mostafa, Charline Bills, PA-C  sacubitril-valsartan (ENTRESTO) 97-103 MG Take 1 tablet by mouth 2 (two) times daily. 03/19/17   Wellington Hampshire, MD    Allergies Esomeprazole magnesium and Eggs or egg-derived products  Family History  Problem Relation Age of Onset  . Hypertension Mother   . Hyperlipidemia Mother   . Diabetes Mother     Social History Social History   Tobacco Use  . Smoking status: Former Smoker    Packs/day: 1.00    Years: 12.00    Pack years: 12.00    Last attempt to quit: 02/23/1998    Years since quitting: 19.5  . Smokeless tobacco: Never Used  Substance Use Topics  . Alcohol use: No    Alcohol/week: 0.0 oz    Comment: 4 heavy alcohol drinker. Quit 2 years ago.  . Drug use: No     Review of Systems  Constitutional: No fever/chills Eyes: No visual changes. Cardiovascular: no chest pain. Respiratory: no cough. No SOB. Gastrointestinal: No abdominal pain.  No nausea, no vomiting.  No diarrhea.  No constipation. Genitourinary: Negative for dysuria. No hematuria Musculoskeletal: Positive for lower back pain radiating into the left leg Skin: Negative  for rash, abrasions, lacerations, ecchymosis. Neurological: Negative for headaches, focal weakness or numbness. 10-point ROS otherwise negative.  ____________________________________________   PHYSICAL EXAM:  VITAL SIGNS: ED Triage Vitals  Enc Vitals Group     BP 09/17/17 2141 (!) 135/94     Pulse Rate 09/17/17 2141 (!) 59     Resp 09/17/17 2141 18     Temp 09/17/17 2141 98.4 F (36.9 C)     Temp Source 09/17/17 2141 Oral     SpO2 09/17/17 2141 94 %     Weight 09/17/17 2142 284 lb (128.8 kg)     Height 09/17/17 2142 5\' 11"  (1.803 m)     Head Circumference --      Peak Flow --      Pain Score 09/17/17 2141 9     Pain Loc --      Pain Edu? --      Excl. in [place]? --  Constitutional: Alert and oriented. Well appearing and in no acute distress. Eyes: Conjunctivae are normal. PERRL. EOMI. Head: Atraumatic. Neck: No stridor.    Cardiovascular: Normal rate, regular rhythm. Normal S1 and S2.  Good peripheral circulation. Respiratory: Normal respiratory effort without tachypnea or retractions. Lungs CTAB. Good air entry to the bases with no decreased or absent breath sounds. Gastrointestinal: Bowel sounds 4 quadrants. Soft and nontender to palpation. No guarding or rigidity. No palpable masses. No distention. No CVA tenderness. Musculoskeletal: Full range of motion to all extremities. No gross deformities appreciated.  No visible deformity to lumbar spine upon inspection.  Patient is nontender to palpation midline.  Diffuse tenderness to palpation of the left paraspinal muscle group and over the left-sided sciatic notch.  No other tenderness to palpation.  No palpable abnormalities.  No shortening of the left leg.  No internal or external rotation noted.  Full range of motion to the left hip with no tenderness to palpation.  Dorsalis pedis pulse intact distally.  Sensation intact distally. Neurologic:  Normal speech and language. No gross focal neurologic deficits are appreciated.   Skin:  Skin is warm, dry and intact. No rash noted. Psychiatric: Mood and affect are normal. Speech and behavior are normal. Patient exhibits appropriate insight and judgement.   ____________________________________________   LABS (all labs ordered are listed, but only abnormal results are displayed)  Labs Reviewed - No data to display ____________________________________________  EKG   ____________________________________________  RADIOLOGY   No results found.  ____________________________________________    PROCEDURES  Procedure(s) performed:    Procedures    Medications  orphenadrine (NORFLEX) injection 60 mg (has no administration in time range)  dexamethasone (DECADRON) injection 10 mg (has no administration in time range)     ____________________________________________   INITIAL IMPRESSION / ASSESSMENT AND PLAN / ED COURSE  Pertinent labs & imaging results that were available during my care of the patient were reviewed by me and considered in my medical decision making (see chart for details).  Review of the [place] CSRS was performed in accordance of the [place] prior to dispensing any controlled drugs.      Patient's diagnosis is consistent with sciatica.  Patient presents the emergency department complaining of lower back pain with radicular symptoms.  Exam is reassuring.  No indication for labs or imaging at this time.  Patient has diagnosed with sciatica.  Patient receives decadron and Norflex injections in the emergency department.. Patient will be discharged home with prescriptions for prednisone and Robaxin for symptom relief. Patient is to follow up with primary care as needed or otherwise directed. Patient is given ED precautions to return to the ED for any worsening or new symptoms.     ____________________________________________  FINAL CLINICAL IMPRESSION(S) / ED DIAGNOSES  Final diagnoses:  Acute left-sided low back pain with left-sided  sciatica      NEW MEDICATIONS STARTED DURING THIS VISIT:  ED Discharge Orders        Ordered    meloxicam (MOBIC) 15 MG tablet  Daily,   Status:  Discontinued     09/17/17 2210    methocarbamol (ROBAXIN) 500 MG tablet  4 times daily,   Status:  Discontinued     09/17/17 2210    methocarbamol (ROBAXIN) 500 MG tablet  4 times daily     09/17/17 2211    predniSONE (DELTASONE) 50 MG tablet  Daily with breakfast     09/17/17 2211          This  chart was dictated using voice recognition software/Dragon. Despite best efforts to proofread, errors can occur which can change the meaning. Any change was purely unintentional.    Darletta Moll, PA-C 09/17/17 2212    Schuyler Amor, MD 09/17/17 2328

## 2017-11-07 NOTE — Progress Notes (Deleted)
Cardiology Office Note  Date:  11/07/2017   ID:  Hector [name], DOB Feb 28, 1965, MRN [id number]  PCP:  [name], MD   No chief complaint on file.   HPI:  52 year old male with a prior history of  nonischemic cardiomyopathy,  chronic combined heart failure,  nonobstructive CAD,  paroxysmal atrial fibrillation and flutter,  hypertension,  obesity, alcohol abuse,  who presents for follow-up of his NICM and atrial fib  Cardiomyopathy was diagnosed in 2009.   Stress testing was nonischemic  felt to be secondary to alcohol.     2016 he was diagnosed with atrial flutter  cardioverted in June 2016.     atrial fibrillation 2016 and was placed on amiodarone and Eliquis evaluated by electrophysiology in 2016   catheterization at that time showed nonobstructive CAD.    Follow-up echo in December 2016 showed recovery of LV function and therefore ICD therapy was not indicated.    echo in April 2018 showed recurrent LV dysfunction with an EF of 30-35% diffuse hypokinesis in the setting of some noncompliance with medications.    He was placed back on Entresto and follow-up echo in October 2018 LV function to 45-50%   gained weight, which she says is secondary to eating too much.   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  PSH:    Past Surgical History:  Procedure Laterality Date  . CARDIAC CATHETERIZATION N/A 01/09/2015   Procedure: Left Heart Cath and Coronary Angiography;  Surgeon: [name], MD;  Location: [facility];  Service: Cardiovascular;  Laterality: N/A;  . ELECTROPHYSIOLOGIC STUDY  N/A 08/16/2014   Procedure: CARDIOVERSION;  Surgeon: [name], MD;  Location: [facility] ORS;  Service: Cardiovascular;  Laterality: N/A;  . FLEXIBLE SIGMOIDOSCOPY N/A 10/10/2015   Procedure: FLEXIBLE SIGMOIDOSCOPY;  Surgeon: [name], MD;  Location: [facility] ENDOSCOPY;  Service: Endoscopy;  Laterality: N/A;  . KNEE SURGERY Right   . NM MYOVIEW LTD  November 2011   No ischemia or infarction. EF 50-55% ( no improvement from 2009 Myoview EF of 28%  . TEE WITHOUT CARDIOVERSION N/A 08/16/2014   Procedure: TRANSESOPHAGEAL ECHOCARDIOGRAM (TEE);  Surgeon: [name], MD;  Location: [facility] ORS;  Service: Cardiovascular;  Laterality: N/A;    Current Outpatient Medications  Medication Sig Dispense Refill  . allopurinol (ZYLOPRIM) 100 MG tablet Take 100 mg by mouth daily as needed.    Marland Kitchen amiodarone (PACERONE) 200 MG tablet Take 1 tablet (200 mg total) by mouth daily. 30 tablet 5  . apixaban (ELIQUIS) 5 MG TABS tablet Take 1 tablet (5 mg total) by mouth 2 (two) times daily. 60 tablet 0  . atorvastatin (LIPITOR) 80 MG tablet Take 1 tablet (80 mg total) by mouth daily. 30 tablet 5  . carvedilol (COREG) 3.125 MG tablet Take 1 tablet (3.125 mg total) by mouth 2 (two) times daily with a meal. 180 tablet 3  . furosemide (LASIX) 40 MG tablet Take 1 tablet (40 mg total) by mouth daily. 90 tablet 3  . levothyroxine (SYNTHROID, LEVOTHROID) 100 MCG tablet Take 100 mcg by mouth daily before breakfast.    . methocarbamol (ROBAXIN) 500 MG tablet Take 1 tablet (500 mg total) by mouth 4 (four) times daily.  16 tablet 0  . predniSONE (DELTASONE) 50 MG tablet Take 1 tablet (50 mg total) by mouth daily with breakfast. 5 tablet 0  . sacubitril-valsartan (ENTRESTO) 97-103 MG Take 1 tablet by mouth 2 (two) times daily. 60 tablet 5   No current facility-administered medications for this visit.      Allergies:   Esomeprazole magnesium and Eggs or egg-derived products   Social History:  The patient  reports that he  quit smoking about 19 years ago. He has a 12.00 pack-year smoking history. He has never used smokeless tobacco. He reports that he does not drink alcohol or use drugs.   Family History:   family history includes Diabetes in his mother; Hyperlipidemia in his mother; Hypertension in his mother.    Review of Systems: ROS   PHYSICAL EXAM: VS:  There were no vitals taken for this visit. , BMI There is no height or weight on file to calculate BMI. GEN: Well nourished, well developed, in no acute distress HEENT: normal Neck: no JVD, carotid bruits, or masses Cardiac: RRR; no murmurs, rubs, or gallops,no edema  Respiratory:  clear to auscultation bilaterally, normal work of breathing GI: soft, nontender, nondistended, + BS MS: no deformity or atrophy Skin: warm and dry, no rash Neuro:  Strength and sensation are intact Psych: euthymic mood, full affect    Recent Labs: 11/11/2016: TSH 19.060 06/02/2017: ALT 16; BUN 26; Creatinine, Ser 2.10; Hemoglobin 14.9; Platelets 221; Potassium 5.1; Sodium 143    Lipid Panel Lab Results  Component Value Date   CHOL 387 (H) 12/12/2015   HDL 56 12/12/2015   LDLCALC 287 (H) 12/12/2015   TRIG 218 (H) 12/12/2015      Wt Readings from Last 3 Encounters:  09/17/17 284 lb (128.8 kg)  07/20/17 288 lb 2 oz (130.7 kg)  06/02/17 284 lb (128.8 kg)       ASSESSMENT AND PLAN:  No diagnosis found.   Disposition:   F/U  6 months  No orders of the defined types were placed in this encounter.    Signed, Esmond Plants, M.D., Ph.D. 11/07/2017  [place], [place]

## 2017-11-08 ENCOUNTER — Ambulatory Visit: Payer: Medicaid Other | Admitting: Cardiovascular Disease

## 2017-11-22 ENCOUNTER — Other Ambulatory Visit: Payer: Self-pay

## 2017-11-22 MED ORDER — SACUBITRIL-VALSARTAN 97-103 MG PO TABS: 1.0000 | ORAL_TABLET | Freq: Two times a day (BID) | ORAL | 5 refills | Status: DC

## 2017-11-24 ENCOUNTER — Other Ambulatory Visit: Payer: Self-pay

## 2017-12-14 ENCOUNTER — Ambulatory Visit: Payer: Medicaid Other | Admitting: Nurse Practitioner

## 2017-12-31 ENCOUNTER — Other Ambulatory Visit: Payer: Self-pay | Admitting: *Deleted

## 2017-12-31 MED ORDER — FUROSEMIDE 40 MG PO TABS: 40.0000 mg | ORAL_TABLET | Freq: Every day | ORAL | 0 refills | Status: DC

## 2017-12-31 NOTE — Telephone Encounter (Signed)
Please advise if ok to refill Furosemide 40 mg qd.  Pt has f/u with Sharolyn Douglas 01/13/18.

## 2018-01-03 ENCOUNTER — Other Ambulatory Visit: Payer: Self-pay | Admitting: Family

## 2018-01-03 DIAGNOSIS — I4892 Unspecified atrial flutter: Secondary | ICD-10-CM

## 2018-01-03 MED ORDER — AMIODARONE HCL 200 MG PO TABS: 200.0000 mg | ORAL_TABLET | Freq: Every day | ORAL | 3 refills | Status: DC

## 2018-01-13 ENCOUNTER — Ambulatory Visit: Payer: Medicaid Other | Admitting: Nurse Practitioner

## 2018-01-13 NOTE — Progress Notes (Deleted)
[name], male    DOB: 1965-08-06, 52 y.o.   MRN: [id number]  HPI  Hector [name] is a 52 y/o male with a history of MI, hyperlipidemia, HTN, CKD, atrial fibrillation, alcohol use, cardiomyopathy, and CHF.   Echo done 12/03/16 showed an EF of 45-50% which is an improvement from previous echo which was done on 06/02/16 and showed an EF of 30-35%. Prior echo on 02/21/15 which showed a normalization of his EF which was 50-55% with mild Hector. No longer ICD candidate. Prior echo on 03/07/14 showed an EF of 20-25% with mild/mod Hector.   Last left heart cath was done 01/09/15.   In the ED 09/17/17 due to sciatica where he was treated and released.   He presents today with a chief complaint of a follow-up visit.  Past Medical History:  Diagnosis Date  . Alcohol abuse   . 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 12/2007 MV: EF 28%, no isch;  b. 8/12 Echo: EF 25-35%; c. 02/2014 Echo: EF 20-25%; d. 12/2014 Cath: minimal CAD; e. 01/2015 Echo: EF 50-55%;  d. 05/2016 Echo: EF 30-35%, diff HK, gr2 DD; e. 11/2016 Echo: EF 45-50%, diff HK.  . Chronic combined systolic (congestive) and diastolic (congestive) heart failure ([place])    a. 05/2016 Most recent Echo: EF 30-35%, diff HK, Gr2 DD, mild Hector, mod dil LA; b. 11/2016 Echo: EF 45-50%, diff HK.  . CKD (chronic kidney disease), stage III ([place])   . Elevated troponin    Chronically elevated. - level was 0.17-0.10 during recent admission  . Essential hypertension   . GI bleed 11/2013  . Hyperlipidemia   . Pancreatitis   . Paroxysmal A-fib ([place])    a. new onset s/p unsuccessful TEE/DCCV on 08/16/2014; b. on amio/eliquis (CHA2DS2VASc = 2-3); c. reports 1-2 hrs of afib ~ q21mos.  . Paroxysmal atrial flutter ([place])    a. new onset 07/2014; b. s/p unsuccessful TEE/DCCV 08/16/2014; c. on apixaban.  . Sleep-disordered breathing    Has yet to have a sleep study   Past Surgical History:  Procedure Laterality Date  . CARDIAC CATHETERIZATION N/A 01/09/2015    Procedure: Left Heart Cath and Coronary Angiography;  Surgeon: [name], MD;  Location: [facility];  Service: Cardiovascular;  Laterality: N/A;  . ELECTROPHYSIOLOGIC STUDY N/A 08/16/2014   Procedure: CARDIOVERSION;  Surgeon: [name], MD;  Location: [facility] ORS;  Service: Cardiovascular;  Laterality: N/A;  . FLEXIBLE SIGMOIDOSCOPY N/A 10/10/2015   Procedure: FLEXIBLE SIGMOIDOSCOPY;  Surgeon: [name], MD;  Location: [facility] ENDOSCOPY;  Service: Endoscopy;  Laterality: N/A;  . KNEE SURGERY Right   . NM MYOVIEW LTD  November 2011   No ischemia or infarction. EF 50-55% ( no improvement from 2009 Myoview EF of 28%  . TEE WITHOUT CARDIOVERSION N/A 08/16/2014   Procedure: TRANSESOPHAGEAL ECHOCARDIOGRAM (TEE);  Surgeon: [name], MD;  Location: [facility] ORS;  Service: Cardiovascular;  Laterality: N/A;   Family History  Problem Relation Age of Onset  . Hypertension Mother   . Hyperlipidemia Mother   . Diabetes Mother    Social History   Tobacco Use  . Smoking status: Former Smoker    Packs/day: 1.00    Years: 12.00    Pack years: 12.00    Last attempt to quit: 02/23/1998    Years since quitting: 19.9  . Smokeless tobacco: Never Used  Substance Use Topics  . Alcohol use: No    Alcohol/week: 0.0 standard  drinks    Comment: 4 heavy alcohol drinker. Quit 2 years ago.   Allergies  Allergen Reactions  . Esomeprazole Magnesium Other (See Comments)    Suspected interstitial nephritis 2018  . Eggs Or Egg-Derived Products Rash     Review of Systems  Constitutional: Negative for appetite change and fatigue.  HENT: Negative for congestion, postnasal drip and sore throat.   Eyes: Negative.   Respiratory: Negative for chest tightness, shortness of breath and wheezing.   Cardiovascular: Negative for chest pain, palpitations and leg swelling.  Gastrointestinal: Negative for abdominal distention and abdominal pain.  Endocrine: Negative.   Genitourinary: Negative.    Musculoskeletal: Negative for back pain and neck pain.  Skin: Negative.   Allergic/Immunologic: Negative.   Neurological: Negative for dizziness and light-headedness.  Hematological: Negative for adenopathy. Does not bruise/bleed easily.  Psychiatric/Behavioral: Negative for dysphoric mood and sleep disturbance (wearing CPAP nightly). The patient is not nervous/anxious.      Physical Exam  Constitutional: He is oriented to person, place, and time. He appears well-developed and well-nourished.  HENT:  Head: Normocephalic and atraumatic.  Neck: Normal range of motion. Neck supple. No JVD present.  Cardiovascular: Normal rate and regular rhythm.  Pulmonary/Chest: Effort normal. He has no wheezes. He has no rales.  Abdominal: Soft. He exhibits no distension. There is no tenderness.  Musculoskeletal: He exhibits no edema or tenderness.  Neurological: He is alert and oriented to person, place, and time.  Skin: Skin is warm and dry.  Psychiatric: He has a normal mood and affect. His behavior is normal. Thought content normal.  Nursing note and vitals reviewed.    Assessment & Plan:  1: Chronic heart failure with preserved ejection fraction- - NYHA Class I - euvolemic today - Reminded to call for an overnight weight gain of >2 pounds or a weekly weight gain of >5 pounds - weight  - has joined [employer] and goes weekly; encouraged him to go more often - not adding salt. Reminded to keep salt under 2000 mg in a day - EF has improved so no indication for ICD at this time - saw cardiology Sharolyn Douglas) 06/02/17 - walking to work (1/4 mile one way)   - PharmD reviewed medications with the patient - BNP 03/07/14 was 4299  2: HTN- - BP - BMP from 06/02/17 reviewed and shows sodium 143, potassium 5.1, creatinine 2.10 and GFR 41 - saw nephrologist [facility]) 07/15/17  3: Atrial fibrillation- - currently rate controlled    4: Obstructive sleep apnea- - sleeping well at night with CPAP machine -  wears it 4-5 hours nightly at least 3 times/week; says that he doesn't wear it nightly because it "irritates him" - does admit that he rests better on the nights that he wears it; encouraged him to wear it nightly  Patient did not bring his medications nor a list. Each medication was verbally reviewed with the patient and he was encouraged to bring the bottles to every visit to confirm accuracy of list.

## 2018-01-13 NOTE — Progress Notes (Deleted)
Office Visit    Patient Name: Hector [name] Date of Encounter: 01/13/2018  Primary Care Provider:  [name], MD Primary Cardiologist:  No primary care provider on file.  Chief Complaint    ***  Past Medical History    Past Medical History:  Diagnosis Date  . Alcohol abuse   . 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 12/2007 MV: EF 28%, no isch;  b. 8/12 Echo: EF 25-35%; c. 02/2014 Echo: EF 20-25%; d. 12/2014 Cath: minimal CAD; e. 01/2015 Echo: EF 50-55%;  d. 05/2016 Echo: EF 30-35%, diff HK, gr2 DD; e. 11/2016 Echo: EF 45-50%, diff HK.  . Chronic combined systolic (congestive) and diastolic (congestive) heart failure ([place])    a. 05/2016 Most recent Echo: EF 30-35%, diff HK, Gr2 DD, mild MR, mod dil LA; b. 11/2016 Echo: EF 45-50%, diff HK.  . CKD (chronic kidney disease), stage III ([place])   . Elevated troponin    Chronically elevated. - level was 0.17-0.10 during recent admission  . Essential hypertension   . GI bleed 11/2013  . Hyperlipidemia   . Pancreatitis   . Paroxysmal A-fib ([place])    a. new onset s/p unsuccessful TEE/DCCV on 08/16/2014; b. on amio/eliquis (CHA2DS2VASc = 2-3); c. reports 1-2 hrs of afib ~ q20mos.  . Paroxysmal atrial flutter ([place])    a. new onset 07/2014; b. s/p unsuccessful TEE/DCCV 08/16/2014; c. on apixaban.  . Sleep-disordered breathing    Has yet to have a sleep study   Past Surgical History:  Procedure Laterality Date  . CARDIAC CATHETERIZATION N/A 01/09/2015   Procedure: Left Heart Cath and Coronary Angiography;  Surgeon: [name], MD;  Location: [facility];  Service: Cardiovascular;  Laterality: N/A;  . ELECTROPHYSIOLOGIC STUDY N/A 08/16/2014   Procedure: CARDIOVERSION;  Surgeon: [name], MD;  Location: [facility] ORS;  Service: Cardiovascular;  Laterality: N/A;  . FLEXIBLE SIGMOIDOSCOPY N/A 10/10/2015   Procedure: FLEXIBLE SIGMOIDOSCOPY;  Surgeon: [name], MD;  Location: [facility] ENDOSCOPY;  Service:  Endoscopy;  Laterality: N/A;  . KNEE SURGERY Right   . NM MYOVIEW LTD  November 2011   No ischemia or infarction. EF 50-55% ( no improvement from 2009 Myoview EF of 28%  . TEE WITHOUT CARDIOVERSION N/A 08/16/2014   Procedure: TRANSESOPHAGEAL ECHOCARDIOGRAM (TEE);  Surgeon: [name], MD;  Location: [facility] ORS;  Service: Cardiovascular;  Laterality: N/A;    Allergies  Allergies  Allergen Reactions  . Esomeprazole Magnesium Other (See Comments)    Suspected interstitial nephritis 2018  . Eggs Or Egg-Derived Products Rash    History of Present Illness    ***  Home Medications    Prior to Admission medications   Medication Sig Start Date End Date Taking? Authorizing Provider  allopurinol (ZYLOPRIM) 100 MG tablet Take 100 mg by mouth daily as needed.    [provider]  amiodarone (PACERONE) 200 MG tablet Take 1 tablet (200 mg total) by mouth daily. 01/03/18   Alisa Graff, FNP  apixaban (ELIQUIS) 5 MG TABS tablet Take 1 tablet (5 mg total) by mouth 2 (two) times daily. 05/31/17   Theora Gianotti, NP  atorvastatin (LIPITOR) 80 MG tablet Take 1 tablet (80 mg total) by mouth daily. 12/04/15   Alisa Graff, FNP  carvedilol (COREG) 3.125 MG tablet Take 1 tablet (3.125 mg total) by mouth 2 (two) times daily with a meal. 07/29/16   [place], Peter M, MD  furosemide (LASIX) 40 MG  tablet Take 1 tablet (40 mg total) by mouth daily. 12/31/17   Theora Gianotti, NP  levothyroxine (SYNTHROID, LEVOTHROID) 100 MCG tablet Take 100 mcg by mouth daily before breakfast.    [provider]  methocarbamol (ROBAXIN) 500 MG tablet Take 1 tablet (500 mg total) by mouth 4 (four) times daily. 09/17/17   Cuthriell, Charline Bills, PA-C  predniSONE (DELTASONE) 50 MG tablet Take 1 tablet (50 mg total) by mouth daily with breakfast. 09/17/17   Cuthriell, Charline Bills, PA-C  sacubitril-valsartan (ENTRESTO) 97-103 MG Take 1 tablet by mouth 2 (two) times daily. 11/22/17   Theora Gianotti, NP    Review of Systems    ***.  All other systems reviewed and are otherwise negative except as noted above.  Physical Exam    VS:  There were no vitals taken for this visit. , BMI There is no height or weight on file to calculate BMI. GEN: Well nourished, well developed, in no acute distress. HEENT: normal. Neck: Supple, no JVD, carotid bruits, or masses. Cardiac: RRR, no murmurs, rubs, or gallops. No clubbing, cyanosis, edema.  Radials/DP/PT 2+ and equal bilaterally.  Respiratory:  Respirations regular and unlabored, clear to auscultation bilaterally. GI: Soft, nontender, nondistended, BS + x 4. MS: no deformity or atrophy. Skin: warm and dry, no rash. Neuro:  Strength and sensation are intact. Psych: Normal affect.  Accessory Clinical Findings    ECG personally reviewed by me today - *** - no acute changes.  Assessment & Plan    1.  ***   Murray Hodgkins, NP 01/13/2018, 9:25 AM

## 2018-01-14 ENCOUNTER — Encounter: Payer: Self-pay | Admitting: Nurse Practitioner

## 2018-01-17 ENCOUNTER — Ambulatory Visit: Payer: Medicaid Other | Admitting: Family

## 2018-01-19 NOTE — Progress Notes (Signed)
[name], male    DOB: 07/26/1965, 52 y.o.   MRN: [id number]  HPI  Hector [name] is a 52 y/o male with a history of MI, hyperlipidemia, HTN, CKD, atrial fibrillation, alcohol use, cardiomyopathy, and CHF.   Echo done 12/03/16 showed an EF of 45-50% which is an improvement from previous echo which was done on 06/02/16 and showed an EF of 30-35%. Prior echo on 02/21/15 which showed a normalization of his EF which was 50-55% with mild Hector. No longer ICD candidate. Prior echo on 03/07/14 showed an EF of 20-25% with mild/mod Hector.   Last left heart cath was done 01/09/15.   In the ED 09/17/17 due to sciatica where he was treated and released.   He presents today with a chief complaint of a follow-up visit. He currently denies any fatigue, cough, shortness of breath, chest pain, pedal edema, palpitations, abdominal distention, dizziness, difficulty sleeping or weight gain. Intermittently has back pain.  Past Medical History:  Diagnosis Date  . Alcohol abuse   . 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 12/2007 MV: EF 28%, no isch;  b. 8/12 Echo: EF 25-35%; c. 02/2014 Echo: EF 20-25%; d. 12/2014 Cath: minimal CAD; e. 01/2015 Echo: EF 50-55%;  d. 05/2016 Echo: EF 30-35%, diff HK, gr2 DD; e. 11/2016 Echo: EF 45-50%, diff HK.  . Chronic combined systolic (congestive) and diastolic (congestive) heart failure ([place])    a. 05/2016 Most recent Echo: EF 30-35%, diff HK, Gr2 DD, mild Hector, mod dil LA; b. 11/2016 Echo: EF 45-50%, diff HK.  . CKD (chronic kidney disease), stage III ([place])   . Elevated troponin    Chronically elevated. - level was 0.17-0.10 during recent admission  . Essential hypertension   . GI bleed 11/2013  . Hyperlipidemia   . Pancreatitis   . Paroxysmal A-fib ([place])    a. new onset s/p unsuccessful TEE/DCCV on 08/16/2014; b. on amio/eliquis (CHA2DS2VASc = 2-3); c. reports 1-2 hrs of afib ~ q39mos.  . Paroxysmal atrial flutter ([place])    a. new onset 07/2014; b. s/p unsuccessful  TEE/DCCV 08/16/2014; c. on apixaban.  . Sleep-disordered breathing    Has yet to have a sleep study   Past Surgical History:  Procedure Laterality Date  . CARDIAC CATHETERIZATION N/A 01/09/2015   Procedure: Left Heart Cath and Coronary Angiography;  Surgeon: [name], MD;  Location: [facility];  Service: Cardiovascular;  Laterality: N/A;  . ELECTROPHYSIOLOGIC STUDY N/A 08/16/2014   Procedure: CARDIOVERSION;  Surgeon: [name], MD;  Location: [facility] ORS;  Service: Cardiovascular;  Laterality: N/A;  . FLEXIBLE SIGMOIDOSCOPY N/A 10/10/2015   Procedure: FLEXIBLE SIGMOIDOSCOPY;  Surgeon: [name], MD;  Location: [facility]OSCOPY;  Service: Endoscopy;  Laterality: N/A;  . KNEE SURGERY Right   . NM MYOVIEW LTD  November 2011   No ischemia or infarction. EF 50-55% ( no improvement from 2009 Myoview EF of 28%  . TEE WITHOUT CARDIOVERSION N/A 08/16/2014   Procedure: TRANSESOPHAGEAL ECHOCARDIOGRAM (TEE);  Surgeon: [name], MD;  Location: [facility] ORS;  Service: Cardiovascular;  Laterality: N/A;   Family History  Problem Relation Age of Onset  . Hypertension Mother   . Hyperlipidemia Mother   . Diabetes Mother    Social History   Tobacco Use  . Smoking status: Former Smoker    Packs/day: 1.00    Years: 12.00    Pack years: 12.00    Last attempt to quit: 02/23/1998  Years since quitting: 19.9  . Smokeless tobacco: Never Used  Substance Use Topics  . Alcohol use: No    Alcohol/week: 0.0 standard drinks    Comment: 4 heavy alcohol drinker. Quit 2 years ago.   Allergies  Allergen Reactions  . Esomeprazole Magnesium Other (See Comments)    Suspected interstitial nephritis 2018  . Eggs Or Egg-Derived Products Rash   Prior to Admission medications   Medication Sig Start Date End Date Taking? Authorizing Provider  allopurinol (ZYLOPRIM) 100 MG tablet Take 100 mg by mouth daily as needed.   Yes [provider]  amiodarone (PACERONE) 200 MG tablet  Take 1 tablet (200 mg total) by mouth daily. 01/03/18  Yes Darylene Price A, FNP  apixaban (ELIQUIS) 5 MG TABS tablet Take 1 tablet (5 mg total) by mouth 2 (two) times daily. 05/31/17  Yes Theora Gianotti, NP  atorvastatin (LIPITOR) 80 MG tablet Take 1 tablet (80 mg total) by mouth daily. 12/04/15  Yes Alisa Graff, FNP  carvedilol (COREG) 3.125 MG tablet Take 1 tablet (3.125 mg total) by mouth 2 (two) times daily with a meal. 07/29/16  Yes Martinique, Peter M, MD  furosemide (LASIX) 40 MG tablet Take 1 tablet (40 mg total) by mouth daily. 12/31/17  Yes Theora Gianotti, NP  levothyroxine (SYNTHROID, LEVOTHROID) 100 MCG tablet Take 100 mcg by mouth daily before breakfast.   Yes [provider]  sacubitril-valsartan (ENTRESTO) 97-103 MG Take 1 tablet by mouth 2 (two) times daily. 11/22/17  Yes Theora Gianotti, NP    Review of Systems  Constitutional: Negative for appetite change and fatigue.  HENT: Negative for congestion, postnasal drip and sore throat.   Eyes: Negative.   Respiratory: Negative for chest tightness, shortness of breath and wheezing.   Cardiovascular: Negative for chest pain, palpitations and leg swelling.  Gastrointestinal: Negative for abdominal distention and abdominal pain.  Endocrine: Negative.   Genitourinary: Negative.   Musculoskeletal: Positive for back pain. Negative for neck pain.  Skin: Negative.   Allergic/Immunologic: Negative.   Neurological: Negative for dizziness and light-headedness.  Hematological: Negative for adenopathy. Does not bruise/bleed easily.  Psychiatric/Behavioral: Negative for dysphoric mood and sleep disturbance (wearing CPAP nightly). The patient is not nervous/anxious.    Vitals:   01/24/18 0913  BP: (!) 137/98  Pulse: (!) 54  Resp: 18  SpO2: 95%  Weight: 286 lb 6 oz (129.9 kg)  Height: 5\' 11"  (1.803 m)   Wt Readings from Last 3 Encounters:  01/24/18 286 lb 6 oz (129.9 kg)  09/17/17 284 lb (128.8 kg)   07/20/17 288 lb 2 oz (130.7 kg)   Lab Results  Component Value Date   CREATININE 2.10 (H) 06/02/2017   CREATININE 1.83 (H) 03/24/2017   CREATININE 1.89 (H) 03/18/2017    Physical Exam  Constitutional: He is oriented to person, place, and time. He appears well-developed and well-nourished.  HENT:  Head: Normocephalic and atraumatic.  Neck: Normal range of motion. Neck supple. No JVD present.  Cardiovascular: Normal rate and regular rhythm.  Pulmonary/Chest: Effort normal. He has no wheezes. He has no rales.  Abdominal: Soft. He exhibits no distension. There is no tenderness.  Musculoskeletal: He exhibits no edema or tenderness.  Neurological: He is alert and oriented to person, place, and time.  Skin: Skin is warm and dry.  Psychiatric: He has a normal mood and affect. His behavior is normal. Thought content normal.  Nursing note and vitals reviewed.    Assessment & Plan:  1: Chronic heart failure with preserved ejection fraction- - NYHA Class I - euvolemic today - Reminded to call for an overnight weight gain of >2 pounds or a weekly weight gain of >5 pounds - weight down 2 pounds from last visit here 6 months ago - has joined [employer] and goes twice weekly now - not adding salt. Reminded to keep salt under 2000 mg in a day - EF has improved so no indication for ICD at this time - saw cardiology Sharolyn Douglas) 06/02/17 - walking to work (1/4 mile one way)  - BNP 03/07/14 was 4299  2: HTN- - BP mildly elevated today - BMP from 06/02/17 reviewed and shows sodium 143, potassium 5.1, creatinine 2.10 and GFR 41 - saw nephrologist [facility]) 07/15/17  3: Obstructive sleep apnea- - sleeping well at night with CPAP machine - wears it 4-5 hours nightly at least 3 times/week; says that he doesn't wear it nightly because it "irritates him" - does admit that he rests better on the nights that he wears it; encouraged him to wear it nightly as it would help with his BP  Patient did not bring his  medications nor a list. Each medication was verbally reviewed with the patient and he was encouraged to bring the bottles to every visit to confirm accuracy of list.  Will not make a follow-up appointment for patient at this time. Advised him that he could call back at anytime to make another appointment.

## 2018-01-24 ENCOUNTER — Ambulatory Visit: Payer: Medicaid Other | Attending: Family | Admitting: Family

## 2018-01-24 ENCOUNTER — Encounter: Payer: Self-pay | Admitting: Family

## 2018-01-24 VITALS — BP 137/98 | HR 54 | Resp 18 | Ht 71.0 in | Wt 286.4 lb

## 2018-01-24 DIAGNOSIS — G4733 Obstructive sleep apnea (adult) (pediatric): Secondary | ICD-10-CM | POA: Diagnosis not present

## 2018-01-24 DIAGNOSIS — I429 Cardiomyopathy, unspecified: Secondary | ICD-10-CM | POA: Insufficient documentation

## 2018-01-24 DIAGNOSIS — Z7901 Long term (current) use of anticoagulants: Secondary | ICD-10-CM | POA: Diagnosis not present

## 2018-01-24 DIAGNOSIS — Z8249 Family history of ischemic heart disease and other diseases of the circulatory system: Secondary | ICD-10-CM | POA: Insufficient documentation

## 2018-01-24 DIAGNOSIS — N183 Chronic kidney disease, stage 3 (moderate): Secondary | ICD-10-CM | POA: Insufficient documentation

## 2018-01-24 DIAGNOSIS — Z87891 Personal history of nicotine dependence: Secondary | ICD-10-CM | POA: Insufficient documentation

## 2018-01-24 DIAGNOSIS — E785 Hyperlipidemia, unspecified: Secondary | ICD-10-CM | POA: Insufficient documentation

## 2018-01-24 DIAGNOSIS — I480 Paroxysmal atrial fibrillation: Secondary | ICD-10-CM | POA: Insufficient documentation

## 2018-01-24 DIAGNOSIS — I130 Hypertensive heart and chronic kidney disease with heart failure and stage 1 through stage 4 chronic kidney disease, or unspecified chronic kidney disease: Secondary | ICD-10-CM | POA: Diagnosis not present

## 2018-01-24 DIAGNOSIS — I5032 Chronic diastolic (congestive) heart failure: Secondary | ICD-10-CM | POA: Diagnosis not present

## 2018-01-24 DIAGNOSIS — I252 Old myocardial infarction: Secondary | ICD-10-CM | POA: Diagnosis not present

## 2018-01-24 DIAGNOSIS — I10 Essential (primary) hypertension: Secondary | ICD-10-CM

## 2018-01-24 DIAGNOSIS — Z79899 Other long term (current) drug therapy: Secondary | ICD-10-CM | POA: Diagnosis not present

## 2018-01-24 NOTE — Patient Instructions (Signed)
Continue weighing daily and call for an overnight weight gain of > 2 pounds or a weekly weight gain of >5 pounds. 

## 2018-03-15 ENCOUNTER — Other Ambulatory Visit: Payer: Self-pay

## 2018-03-15 MED ORDER — CARVEDILOL 3.125 MG PO TABS: 3.1250 mg | ORAL_TABLET | Freq: Two times a day (BID) | ORAL | 3 refills | Status: DC

## 2018-03-15 MED ORDER — ATORVASTATIN CALCIUM 80 MG PO TABS: 80.0000 mg | ORAL_TABLET | Freq: Every day | ORAL | 5 refills | Status: DC

## 2018-03-17 ENCOUNTER — Other Ambulatory Visit: Payer: Self-pay | Admitting: Cardiovascular Disease

## 2018-03-17 MED ORDER — SACUBITRIL-VALSARTAN 97-103 MG PO TABS: 1.0000 | ORAL_TABLET | Freq: Two times a day (BID) | ORAL | 2 refills | Status: DC

## 2018-03-17 NOTE — Telephone Encounter (Signed)
°*  STAT* If patient is at the pharmacy, call can be transferred to refill team.   1. Which medications need to be refilled? (please list name of each medication and dose if known)  Entresto 97-103 MG - 1 tablet 2 times daily   2. Which pharmacy/location (including street and city if local pharmacy) is medication to be sent to? [facility]  3. Do they need a 30 day or 90 day supply? 30 day

## 2018-04-07 NOTE — Progress Notes (Deleted)
Cardiology Office Note Date:  04/07/2018  Patient ID:  Rudolfo, Brandow 1965/09/23, MRN [id number] PCP:  [name], MD  Cardiologist:  Formerly Dr. Yvone Neu  ***refresh   Chief Complaint: Follow up  History of Present Illness: JAZEN SPRAGGINS is a 53 y.o. male with history of ***   Past Medical History:  Diagnosis Date  . Alcohol abuse   . 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 12/2007 MV: EF 28%, no isch;  b. 8/12 Echo: EF 25-35%; c. 02/2014 Echo: EF 20-25%; d. 12/2014 Cath: minimal CAD; e. 01/2015 Echo: EF 50-55%;  d. 05/2016 Echo: EF 30-35%, diff HK, gr2 DD; e. 11/2016 Echo: EF 45-50%, diff HK.  . Chronic combined systolic (congestive) and diastolic (congestive) heart failure ([place])    a. 05/2016 Most recent Echo: EF 30-35%, diff HK, Gr2 DD, mild MR, mod dil LA; b. 11/2016 Echo: EF 45-50%, diff HK.  . CKD (chronic kidney disease), stage III ([place])   . Elevated troponin    Chronically elevated. - level was 0.17-0.10 during recent admission  . Essential hypertension   . GI bleed 11/2013  . Hyperlipidemia   . Pancreatitis   . Paroxysmal A-fib ([place])    a. new onset s/p unsuccessful TEE/DCCV on 08/16/2014; b. on amio/eliquis (CHA2DS2VASc = 2-3); c. reports 1-2 hrs of afib ~ q42mos.  . Paroxysmal atrial flutter ([place])    a. new onset 07/2014; b. s/p unsuccessful TEE/DCCV 08/16/2014; c. on apixaban.  . Sleep-disordered breathing    Has yet to have a sleep study    Past Surgical History:  Procedure Laterality Date  . CARDIAC CATHETERIZATION N/A 01/09/2015   Procedure: Left Heart Cath and Coronary Angiography;  Surgeon: [name], MD;  Location: [facility];  Service: Cardiovascular;  Laterality: N/A;  . ELECTROPHYSIOLOGIC STUDY N/A 08/16/2014   Procedure: CARDIOVERSION;  Surgeon: [name], MD;  Location: [facility] ORS;  Service: Cardiovascular;  Laterality: N/A;  . FLEXIBLE SIGMOIDOSCOPY N/A 10/10/2015   Procedure: FLEXIBLE SIGMOIDOSCOPY;  Surgeon:  [name], MD;  Location: [facility] ENDOSCOPY;  Service: Endoscopy;  Laterality: N/A;  . KNEE SURGERY Right   . NM MYOVIEW LTD  November 2011   No ischemia or infarction. EF 50-55% ( no improvement from 2009 Myoview EF of 28%  . TEE WITHOUT CARDIOVERSION N/A 08/16/2014   Procedure: TRANSESOPHAGEAL ECHOCARDIOGRAM (TEE);  Surgeon: [name], MD;  Location: [facility] ORS;  Service: Cardiovascular;  Laterality: N/A;    No outpatient medications have been marked as taking for the 04/08/18 encounter (Appointment) with Rise Mu, PA-C.    Allergies:   Esomeprazole magnesium and Eggs or egg-derived products   Social History:  The patient  reports that he quit smoking about 20 years ago. He has a 12.00 pack-year smoking history. He has never used smokeless tobacco. He reports that he does not drink alcohol or use drugs.   Family History:  The patient's family history includes Diabetes in his mother; Hyperlipidemia in his mother; Hypertension in his mother.  ROS:   ROS   PHYSICAL EXAM: *** VS:  There were no vitals taken for this visit. BMI: There is no height or weight on file to calculate BMI.  Physical Exam   EKG:  Was ordered and interpreted by me today. Shows ***  Recent Labs: 06/02/2017: ALT 16; BUN 26; Creatinine, Ser 2.10; Hemoglobin 14.9; Platelets 221; Potassium 5.1; Sodium 143  No results found for requested labs within last 8760 hours.  CrCl cannot be calculated (Patient's most recent lab result is older than the maximum 21 days allowed.).   Wt Readings from Last 3 Encounters:  01/24/18 286 lb 6 oz (129.9 kg)  09/17/17 284 lb (128.8 kg)  07/20/17 288 lb 2 oz (130.7 kg)     Other studies reviewed: Additional studies/records reviewed today include: summarized above  ASSESSMENT AND PLAN:  1. ***  Disposition: F/u with Dr. Marland Kitchen in ***  Current medicines are reviewed at length with the patient today.  The patient did not have any concerns regarding  medicines.  Signed, Christell Faith, PA-C 04/07/2018 8:24 AM     [facility] [address] [place] [address] [place], [place] [address] [phone]

## 2018-04-08 ENCOUNTER — Ambulatory Visit: Payer: Medicaid Other | Admitting: Physician Assistant

## 2018-05-07 NOTE — Progress Notes (Deleted)
Cardiology Office Note Date:  05/07/2018  Patient ID:  Hector [name], Hector [name] Jul 10, 1965, MRN [id number] PCP:  [name], MD  Cardiologist:  Formerly, Dr. Yvone Neu, MD  ***refresh   Chief Complaint: Follow up  History of Present Illness: Hector [name] is a 53 y.o. male with history of nonobstructive CAD, chronic combined CHF, alcoholic NICM dating back to 2009, PAF/flutter on Eliquis, CKD stage III, HTN, HLD, obesity, sleep apnea on CPAP, and hypothyroidism who presents for follow up of his CHF and Afib/flutter.   He was previously followed by Dr. Ellyn Hack and most recently Dr. Yvone Neu. As noted, cardiomyopathy was diagnosed in 2009. Stress testing was undertaken at that time did not show any ischemia. It was felt that his cardiomyopathy was secondary to alcohol. He did quit drinking and EF has varied over the years. In 2016, he was diagnosed with atrial flutter and was cardioverted in 07/2014. He subsequently reverted to atrial fibrillation and was placed on amiodarone and Eliquis. In late 2016, after being seen by electrophysiology due to ongoing low LV function, he underwent diagnostic catheterization which showed nonobstructive CAD. Follow-up echo in 01/2015 showed recovery of LV function and therefore ICD therapy was not indicated. A follow-up echo was performed in 05/2016 and showed recurrent LV dysfunction with an EF of 30-35% and diffuse hypokinesis. He was placed back on Entresto. Following titration of Entresto, echo in 11/2016 showed improvement in EF to 45-50%, mildly dilated LV, diffuse hypokinesis. He was last seen in our office in 05/2017 in follow up, and was doing well, maintaining sinus rhythm. Since then, he has been followed by the [facility] CHF clinic.   ***  Past Medical History:  Diagnosis Date  . Alcohol abuse   . 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 12/2007 MV: EF 28%, no isch;  b. 8/12 Echo: EF 25-35%; c. 02/2014 Echo: EF 20-25%; d. 12/2014 Cath:  minimal CAD; e. 01/2015 Echo: EF 50-55%;  d. 05/2016 Echo: EF 30-35%, diff HK, gr2 DD; e. 11/2016 Echo: EF 45-50%, diff HK.  . Chronic combined systolic (congestive) and diastolic (congestive) heart failure ([place])    a. 05/2016 Most recent Echo: EF 30-35%, diff HK, Gr2 DD, mild MR, mod dil LA; b. 11/2016 Echo: EF 45-50%, diff HK.  . CKD (chronic kidney disease), stage III ([place])   . Elevated troponin    Chronically elevated. - level was 0.17-0.10 during recent admission  . Essential hypertension   . GI bleed 11/2013  . Hyperlipidemia   . Pancreatitis   . Paroxysmal A-fib ([place])    a. new onset s/p unsuccessful TEE/DCCV on 08/16/2014; b. on amio/eliquis (CHA2DS2VASc = 2-3); c. reports 1-2 hrs of afib ~ q22mos.  . Paroxysmal atrial flutter ([place])    a. new onset 07/2014; b. s/p unsuccessful TEE/DCCV 08/16/2014; c. on apixaban.  . Sleep-disordered breathing    Has yet to have a sleep study    Past Surgical History:  Procedure Laterality Date  . CARDIAC CATHETERIZATION N/A 01/09/2015   Procedure: Left Heart Cath and Coronary Angiography;  Surgeon: [name], MD;  Location: [facility];  Service: Cardiovascular;  Laterality: N/A;  . ELECTROPHYSIOLOGIC STUDY N/A 08/16/2014   Procedure: CARDIOVERSION;  Surgeon: [name], MD;  Location: [facility] ORS;  Service: Cardiovascular;  Laterality: N/A;  . FLEXIBLE SIGMOIDOSCOPY N/A 10/10/2015   Procedure: FLEXIBLE SIGMOIDOSCOPY;  Surgeon: [name], MD;  Location[facility] ENDOSCOPY;  Service: Endoscopy;  Laterality: N/A;  . KNEE SURGERY Right   .  NM MYOVIEW LTD  November 2011   No ischemia or infarction. EF 50-55% ( no improvement from 2009 Myoview EF of 28%  . TEE WITHOUT CARDIOVERSION N/A 08/16/2014   Procedure: TRANSESOPHAGEAL ECHOCARDIOGRAM (TEE);  Surgeon: [name], MD;  Location: [facility] ORS;  Service: Cardiovascular;  Laterality: N/A;    No outpatient medications have been marked as taking for the 05/17/18 encounter  (Appointment) with Rise Mu, PA-C.    Allergies:   Esomeprazole magnesium and Eggs or egg-derived products   Social History:  The patient  reports that he quit smoking about 20 years ago. He has a 12.00 pack-year smoking history. He has never used smokeless tobacco. He reports that he does not drink alcohol or use drugs.   Family History:  The patient's family history includes Diabetes in his mother; Hyperlipidemia in his mother; Hypertension in his mother.  ROS:   ROS   PHYSICAL EXAM: *** VS:  There were no vitals taken for this visit. BMI: There is no height or weight on file to calculate BMI.  Physical Exam   EKG:  Was ordered and interpreted by me today. Shows ***  Recent Labs: 06/02/2017: ALT 16; BUN 26; Creatinine, Ser 2.10; Hemoglobin 14.9; Platelets 221; Potassium 5.1; Sodium 143  No results found for requested labs within last 8760 hours.   CrCl cannot be calculated (Patient's most recent lab result is older than the maximum 21 days allowed.).   Wt Readings from Last 3 Encounters:  01/24/18 286 lb 6 oz (129.9 kg)  09/17/17 284 lb (128.8 kg)  07/20/17 288 lb 2 oz (130.7 kg)     Other studies reviewed: Additional studies/records reviewed today include: summarized above  ASSESSMENT AND PLAN:  1. ***  Disposition: F/u with Dr. Marland Kitchen in ***  Current medicines are reviewed at length with the patient today.  The patient did not have any concerns regarding medicines.  Signed, Christell Faith, PA-C 05/07/2018 10:09 AM     [facility] [address] [place] [address] [place], [place] [address] [phone]

## 2018-05-13 ENCOUNTER — Telehealth: Payer: Self-pay

## 2018-05-13 MED ORDER — APIXABAN 5 MG PO TABS: 5.0000 mg | ORAL_TABLET | Freq: Two times a day (BID) | ORAL | 3 refills | Status: DC

## 2018-05-13 MED ORDER — FUROSEMIDE 40 MG PO TABS: 40.0000 mg | ORAL_TABLET | Freq: Every day | ORAL | 3 refills | Status: DC

## 2018-05-13 NOTE — Telephone Encounter (Signed)
Cardiac Questionnaire:  Since your last visit or hospitalization:  1. Have you been having chest pain? no  2. Have you been having shortness of breath? no  3. Have you been having increasing edema, wt gain, or increase in abdominal girth (pants fitting more tightly)? no  4. Have you had any passing out spells? No  ?  COVID-19 Pre-Screening Questions:  . Have you been in contact with someone that was recently sick with fever/cough or confirmed to have the Covid19 virus? no  *Contact with a confirmed case should stay at home, away from confirmed patient, monitor symptoms, and reach out to PCP for e-visit/additional testing.  2. Do you have any of the following symptoms [cough, fever (100.4 or greater)], and/or shortness of breath)? No  ?  Pt did not need refills at this time.  ?  Will cancel appt and route to COVID cancel pool.   

## 2018-05-17 ENCOUNTER — Ambulatory Visit: Payer: Medicaid Other | Admitting: Physician Assistant

## 2018-06-14 NOTE — Telephone Encounter (Signed)
Patient was in the [employer] r/s pool for [place], but he last saw Ignacia Bayley, NP on 06/02/17 and was to follow up with Dr. Rockey Situ (previously an [facility] patient).  Please call to see if ok with a virtual appointment with Dr. Rockey Situ due to his in office visit that was canelled on 05/17/18 with Christell Faith due to [place].    Thanks!

## 2018-06-20 ENCOUNTER — Other Ambulatory Visit: Payer: Self-pay | Admitting: Family

## 2018-06-20 MED ORDER — SACUBITRIL-VALSARTAN 97-103 MG PO TABS: 1.0000 | ORAL_TABLET | Freq: Two times a day (BID) | ORAL | 5 refills | Status: DC

## 2018-06-20 NOTE — Telephone Encounter (Signed)
No ans no vm   °

## 2018-06-24 ENCOUNTER — Telehealth: Payer: Self-pay

## 2018-06-24 NOTE — Telephone Encounter (Signed)

## 2018-06-26 NOTE — Progress Notes (Signed)
Virtual Visit via Telephone Note   This visit type was conducted due to national recommendations for restrictions regarding the COVID-19 Pandemic (e.g. social distancing) in an effort to limit this patient's exposure and mitigate transmission in our community.  Due to his co-morbid illnesses, this patient is at least at moderate risk for complications without adequate follow up.  This format is felt to be most appropriate for this patient at this time.  The patient did not have access to video technology/had technical difficulties with video requiring transitioning to audio format only (telephone).  All issues noted in this document were discussed and addressed.  No physical exam could be performed with this format.  Please refer to the patient's chart for his  consent to telehealth for [facility].   I connected with  [name] on 06/28/18 by a video enabled telemedicine application and verified that I am speaking with the correct person using two identifiers. I discussed the limitations of evaluation and management by telemedicine. The patient expressed understanding and agreed to proceed.   Evaluation Performed:  Follow-up visit  Date:  06/28/2018   ID:  Hector [name], DOB 05-26-1965, MRN [id number]  Patient Location:  [address] [place] [address]   Provider location:   [facility], [place] office  PCP:  [name], MD  Cardiologist:  [name]   Chief Complaint: Weight gain    History of Present Illness:    Hector [name] is a 53 y.o. male who presents via audio/video conferencing for a telehealth visit today.   The patient does not symptoms concerning for COVID-19 infection (fever, chills, cough, or new SHORTNESS OF BREATH).   Patient has a past medical history of nonischemic cardiomyopathy, possibly alcohol-related, cardiac cath 2016 chronic combined heart failure,  nonobstructive CAD,  paroxysmal atrial  fibrillation and flutter,  hypertension,  obesity,  alcohol abuse Paroxysmal atrial fibrillation.  Flutter Possible sleep apnea Ejection fraction in the 20s dating back to 2009, confirmed by echo 2012,  most recent ejection fraction 45 to 50% in October 2018  Prior records reviewed with him in detail, He is new to my office and we have reviewed prior history with him In 2016 he was diagnosed with atrial flutter and was cardioverted in June 2016.   reverted to atrial fibrillation and was placed on amiodarone and Eliquis   Seen by electrophysiology in 2016 in the setting of LV dysfunction and  catheterization at that time showed nonobstructive CAD.     echo in December 2016 showed recovery of LV function   ICD therapy was not indicated.    echo in April 2018 showed recurrent LV dysfunction with an EF of 30-35% diffuse hypokinesis in the setting of some noncompliance with medications.     back on Entresto and follow-up echo in October 2018 showed improvement in LV function to 45-50% with diffuse hypokinesis.    In follow-up reports that he is active, walks daily Weight continues to run high 280 range Reports typically eats too much  No chest pain, shortness of breath palpitations, PND orthopnea Denies any significant leg edema   He reports compliance with all medications.  Last office visit CHF clinic December 2019 No lab work in over a year Prior lab work reviewed with him   Prior CV studies:   The following studies were reviewed today:  Prior cardiac catheterization November 2016 Nonobstructive disease  Past Medical History:  Diagnosis Date  .  Alcohol abuse   . 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 12/2007 MV: EF 28%, no isch;  b. 8/12 Echo: EF 25-35%; c. 02/2014 Echo: EF 20-25%; d. 12/2014 Cath: minimal CAD; e. 01/2015 Echo: EF 50-55%;  d. 05/2016 Echo: EF 30-35%, diff HK, gr2 DD; e. 11/2016 Echo: EF 45-50%, diff HK.  . Chronic combined systolic (congestive) and  diastolic (congestive) heart failure ([place])    a. 05/2016 Most recent Echo: EF 30-35%, diff HK, Gr2 DD, mild MR, mod dil LA; b. 11/2016 Echo: EF 45-50%, diff HK.  . CKD (chronic kidney disease), stage III ([place])   . Elevated troponin    Chronically elevated. - level was 0.17-0.10 during recent admission  . Essential hypertension   . GI bleed 11/2013  . Hyperlipidemia   . Pancreatitis   . Paroxysmal A-fib ([place])    a. new onset s/p unsuccessful TEE/DCCV on 08/16/2014; b. on amio/eliquis (CHA2DS2VASc = 2-3); c. reports 1-2 hrs of afib ~ q66mos.  . Paroxysmal atrial flutter ([place])    a. new onset 07/2014; b. s/p unsuccessful TEE/DCCV 08/16/2014; c. on apixaban.  . Sleep-disordered breathing    Has yet to have a sleep study   Past Surgical History:  Procedure Laterality Date  . CARDIAC CATHETERIZATION N/A 01/09/2015   Procedure: Left Heart Cath and Coronary Angiography;  Surgeon: [name], MD;  Location: [place] CV LAB;  Service: Cardiovascular;  Laterality: N/A;  . ELECTROPHYSIOLOGIC STUDY N/A 08/16/2014   Procedure: CARDIOVERSION;  Surgeon: [name], MD;  Location: [facility] ORS;  Service: Cardiovascular;  Laterality: N/A;  . FLEXIBLE SIGMOIDOSCOPY N/A 10/10/2015   Procedure: FLEXIBLE SIGMOIDOSCOPY;  Surgeon: [name], MD;  Location: [facility] ENDOSCOPY;  Service: Endoscopy;  Laterality: N/A;  . KNEE SURGERY Right   . NM MYOVIEW LTD  November 2011   No ischemia or infarction. EF 50-55% ( no improvement from 2009 Myoview EF of 28%  . TEE WITHOUT CARDIOVERSION N/A 08/16/2014   Procedure: TRANSESOPHAGEAL ECHOCARDIOGRAM (TEE);  Surgeon: [name], MD;  Location: [facility] ORS;  Service: Cardiovascular;  Laterality: N/A;     Current Meds  Medication Sig  . allopurinol (ZYLOPRIM) 100 MG tablet Take 100 mg by mouth daily as needed.  Marland Kitchen amiodarone (PACERONE) 200 MG tablet Take 1 tablet (200 mg total) by mouth daily.  Marland Kitchen apixaban (ELIQUIS) 5 MG TABS tablet Take 1 tablet (5 mg total)  by mouth 2 (two) times daily.  Marland Kitchen atorvastatin (LIPITOR) 80 MG tablet Take 1 tablet (80 mg total) by mouth daily.  . carvedilol (COREG) 3.125 MG tablet Take 1 tablet (3.125 mg total) by mouth 2 (two) times daily with a meal.  . furosemide (LASIX) 40 MG tablet Take 1 tablet (40 mg total) by mouth daily.  Marland Kitchen levothyroxine (SYNTHROID, LEVOTHROID) 100 MCG tablet Take 100 mcg by mouth daily before breakfast.  . sacubitril-valsartan (ENTRESTO) 97-103 MG Take 1 tablet by mouth 2 (two) times daily.     Allergies:   Esomeprazole magnesium and Eggs or egg-derived products   Social History   Tobacco Use  . Smoking status: Former Smoker    Packs/day: 1.00    Years: 12.00    Pack years: 12.00    Last attempt to quit: 02/23/1998    Years since quitting: 20.3  . Smokeless tobacco: Never Used  Substance Use Topics  . Alcohol use: No    Alcohol/week: 0.0 standard drinks    Comment: 4 heavy alcohol drinker. Quit 2 years ago.  Marland Kitchen  Drug use: No     Current Outpatient Medications on File Prior to Visit  Medication Sig Dispense Refill  . allopurinol (ZYLOPRIM) 100 MG tablet Take 100 mg by mouth daily as needed.    Marland Kitchen amiodarone (PACERONE) 200 MG tablet Take 1 tablet (200 mg total) by mouth daily. 90 tablet 3  . apixaban (ELIQUIS) 5 MG TABS tablet Take 1 tablet (5 mg total) by mouth 2 (two) times daily. 60 tablet 3  . atorvastatin (LIPITOR) 80 MG tablet Take 1 tablet (80 mg total) by mouth daily. 30 tablet 5  . carvedilol (COREG) 3.125 MG tablet Take 1 tablet (3.125 mg total) by mouth 2 (two) times daily with a meal. 180 tablet 3  . furosemide (LASIX) 40 MG tablet Take 1 tablet (40 mg total) by mouth daily. 90 tablet 3  . levothyroxine (SYNTHROID, LEVOTHROID) 100 MCG tablet Take 100 mcg by mouth daily before breakfast.    . sacubitril-valsartan (ENTRESTO) 97-103 MG Take 1 tablet by mouth 2 (two) times daily. 60 tablet 5   No current facility-administered medications on file prior to visit.      Family  Hx: The patient's family history includes Diabetes in his mother; Hyperlipidemia in his mother; Hypertension in his mother.  ROS:   Please see the history of present illness.    Review of Systems  Constitutional: Negative.   Respiratory: Negative.   Cardiovascular: Negative.   Gastrointestinal: Negative.   Musculoskeletal: Negative.   Neurological: Negative.   Psychiatric/Behavioral: Negative.   All other systems reviewed and are negative.     Labs/Other Tests and Data Reviewed:    Recent Labs: No results found for requested labs within last 8760 hours.   Recent Lipid Panel Lab Results  Component Value Date/Time   CHOL 387 (H) 12/12/2015 09:49 AM   CHOL 216 (H) 05/22/2015 09:05 AM   CHOL 118 03/08/2014 04:10 AM   TRIG 218 (H) 12/12/2015 09:49 AM   TRIG 95 03/08/2014 04:10 AM   HDL 56 12/12/2015 09:49 AM   HDL 37 (L) 05/22/2015 09:05 AM   HDL 28 (L) 03/08/2014 04:10 AM   CHOLHDL 6.9 12/12/2015 09:49 AM   LDLCALC 287 (H) 12/12/2015 09:49 AM   LDLCALC 139 (H) 05/22/2015 09:05 AM   LDLCALC 71 03/08/2014 04:10 AM    Wt Readings from Last 3 Encounters:  01/24/18 286 lb 6 oz (129.9 kg)  09/17/17 284 lb (128.8 kg)  07/20/17 288 lb 2 oz (130.7 kg)     Exam:    Vital Signs: Vital signs may also be detailed in the HPI There were no vitals taken for this visit.  Wt Readings from Last 3 Encounters:  01/24/18 286 lb 6 oz (129.9 kg)  09/17/17 284 lb (128.8 kg)  07/20/17 288 lb 2 oz (130.7 kg)   Temp Readings from Last 3 Encounters:  09/17/17 98.5 F (36.9 C) (Oral)  11/15/16 99.9 F (37.7 C) (Oral)  06/18/16 98 F (36.7 C) (Oral)   BP Readings from Last 3 Encounters:  01/24/18 (!) 137/98  09/17/17 (!) 134/98  07/20/17 121/87   Pulse Readings from Last 3 Encounters:  01/24/18 (!) 54  09/17/17 62  07/20/17 73    132/98 Pulse 65  Well nourished, well developed male in no acute distress. Constitutional:  oriented to person, place, and time. No distress.     ASSESSMENT & PLAN:    Chronic systolic heart failure (HCC) Euvolemic,  Will order   Paroxysmal atrial flutter (HCC) Low heart rate,  denies any tachycardia or palpitations, likely maintaining normal sinus rhythm  Paroxysmal atrial fibrillation (HCC) As above likely maintaining normal sinus rhythm Needs amiodarone lab work  Nonischemic cardiomyopathy ([place]) Improvement in his ejection fraction last echocardiogram in 2018 mildly depressed No medication changes made  Essential hypertension Blood pressure diastolic mildly elevated He will continue to monitor for now Heading into the summer, will try to avoid excessively low pressures He does report spending time in the outdoors, hot weather  Obstructive sleep apnea Uses his CPAP on a regular basis  Mixed hyperlipidemia Markedly elevated numbers several years ago, will order repeat fasting lipids  COVID-19 Education: The signs and symptoms of COVID-19 were discussed with the patient and how to seek care for testing (follow up with PCP or arrange E-visit).  The importance of social distancing was discussed today.  Patient Risk:   After full review of this patients clinical status, I feel that they are at least moderate risk at this time.  Time:   Today, I have spent 25 minutes with the patient with telehealth technology discussing the cardiac and medical problems/diagnoses detailed above   10 min spent reviewing the chart prior to patient visit today   Medication Adjustments/Labs and Tests Ordered: Current medicines are reviewed at length with the patient today.  Concerns regarding medicines are outlined above.   Tests Ordered: No tests ordered   Medication Changes: No changes made   Disposition: Follow-up in 12 months   Signed, Ida Rogue, MD  06/28/2018 10:14 AM    [place] Office [address], [place], [place] [address]

## 2018-06-28 ENCOUNTER — Other Ambulatory Visit: Payer: Self-pay

## 2018-06-28 ENCOUNTER — Telehealth (INDEPENDENT_AMBULATORY_CARE_PROVIDER_SITE_OTHER): Payer: Medicaid Other | Admitting: Cardiovascular Disease

## 2018-06-28 DIAGNOSIS — I480 Paroxysmal atrial fibrillation: Secondary | ICD-10-CM

## 2018-06-28 DIAGNOSIS — I5022 Chronic systolic (congestive) heart failure: Secondary | ICD-10-CM

## 2018-06-28 DIAGNOSIS — I428 Other cardiomyopathies: Secondary | ICD-10-CM

## 2018-06-28 DIAGNOSIS — I10 Essential (primary) hypertension: Secondary | ICD-10-CM

## 2018-06-28 DIAGNOSIS — I4892 Unspecified atrial flutter: Secondary | ICD-10-CM

## 2018-06-28 DIAGNOSIS — E782 Mixed hyperlipidemia: Secondary | ICD-10-CM

## 2018-06-28 DIAGNOSIS — G4733 Obstructive sleep apnea (adult) (pediatric): Secondary | ICD-10-CM

## 2018-06-28 NOTE — Patient Instructions (Addendum)
Needs lab work such as TSH, lipids, CMP For amiodarone surveillance, hyperlipidemia, on Entresto He can do labs in the office in July or August  Medication Instructions:  No changes  If you need a refill on your cardiac medications before your next appointment, please call your pharmacy.    Lab work: Labs needed for amiodarone surveillance, hyperlipidemia, and on Entresto  TSH, Lipid panel, and CMP to be done in August. (Ok to schedule for August here in office per [place])  Please make sure not to eat or drink anything after midnight before except small sip of water with your pills. Someone will be in touch to schedule these labs to be done here in our office at [place].   If you have labs (blood work) drawn today and your tests are completely normal, you will receive your results only by: Marland Kitchen MyChart Message (if you have MyChart) OR . A paper copy in the mail If you have any lab test that is abnormal or we need to change your treatment, we will call you to review the results.   Testing/Procedures: No new testing needed   Follow-Up: At [facility], you and your health needs are our priority.  As part of our continuing mission to provide you with exceptional heart care, we have created designated Provider Care Teams.  These Care Teams include your primary Cardiologist (physician) and Advanced Practice Providers (APPs -  Physician Assistants and Nurse Practitioners) who all work together to provide you with the care you need, when you need it.  . You will need a follow up appointment in 12 months .   Please call our office 2 months in advance to schedule this appointment.    . Providers on your designated Care Team:   . Murray Hodgkins, NP . Christell Faith, PA-C . Marrianne Mood, PA-C  Any Other Special Instructions Will Be Listed Below (If Applicable).  For educational health videos Log in to : [email or web address] Or : [email or web address], password  : [email or web address]

## 2018-09-09 ENCOUNTER — Other Ambulatory Visit: Payer: Self-pay | Admitting: *Deleted

## 2018-09-09 DIAGNOSIS — Z79899 Other long term (current) drug therapy: Secondary | ICD-10-CM

## 2018-09-09 DIAGNOSIS — I480 Paroxysmal atrial fibrillation: Secondary | ICD-10-CM

## 2018-09-09 DIAGNOSIS — E782 Mixed hyperlipidemia: Secondary | ICD-10-CM

## 2018-09-19 NOTE — Progress Notes (Deleted)
[name], male    DOB: 01-08-1966, 53 y.o.   MRN: [id number]  HPI  Hector [name] is a 53 y/o male with a history of MI, hyperlipidemia, HTN, CKD, atrial fibrillation, alcohol use, cardiomyopathy, and CHF.   Echo done 12/03/16 showed an EF of 45-50% which is an improvement from previous echo which was done on 06/02/16 and showed an EF of 30-35%. Prior echo on 02/21/15 which showed a normalization of his EF which was 50-55% with mild Hector. No longer ICD candidate. Prior echo on 03/07/14 showed an EF of 20-25% with mild/mod Hector.   Last left heart cath was done 01/09/15.   Has not been admitted or been in the ED in the last 6 months.   He presents today with a chief complaint of a follow-up visit.   Past Medical History:  Diagnosis Date  . Alcohol abuse   . 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 12/2007 MV: EF 28%, no isch;  b. 8/12 Echo: EF 25-35%; c. 02/2014 Echo: EF 20-25%; d. 12/2014 Cath: minimal CAD; e. 01/2015 Echo: EF 50-55%;  d. 05/2016 Echo: EF 30-35%, diff HK, gr2 DD; e. 11/2016 Echo: EF 45-50%, diff HK.  . Chronic combined systolic (congestive) and diastolic (congestive) heart failure ([place])    a. 05/2016 Most recent Echo: EF 30-35%, diff HK, Gr2 DD, mild Hector, mod dil LA; b. 11/2016 Echo: EF 45-50%, diff HK.  . CKD (chronic kidney disease), stage III ([place])   . Elevated troponin    Chronically elevated. - level was 0.17-0.10 during recent admission  . Essential hypertension   . GI bleed 11/2013  . Hyperlipidemia   . Pancreatitis   . Paroxysmal A-fib ([place])    a. new onset s/p unsuccessful TEE/DCCV on 08/16/2014; b. on amio/eliquis (CHA2DS2VASc = 2-3); c. reports 1-2 hrs of afib ~ q52mos.  . Paroxysmal atrial flutter ([place])    a. new onset 07/2014; b. s/p unsuccessful TEE/DCCV 08/16/2014; c. on apixaban.  . Sleep-disordered breathing    Has yet to have a sleep study   Past Surgical History:  Procedure Laterality Date  . CARDIAC CATHETERIZATION N/A 01/09/2015    Procedure: Left Heart Cath and Coronary Angiography;  Surgeon: [name], MD;  Location: [facility] CV LAB;  Service: Cardiovascular;  Laterality: N/A;  . ELECTROPHYSIOLOGIC STUDY N/A 08/16/2014   Procedure: CARDIOVERSION;  Surgeon: [name], MD;  Location: [facility] ORS;  Service: Cardiovascular;  Laterality: N/A;  . FLEXIBLE SIGMOIDOSCOPY N/A 10/10/2015   Procedure: FLEXIBLE SIGMOIDOSCOPY;  Surgeon: [name], MD;  [facility]: [facility] ENDOSCOPY;  Service: Endoscopy;  Laterality: N/A;  . KNEE SURGERY Right   . NM MYOVIEW LTD  November 2011   No ischemia or infarction. EF 50-55% ( no improvement from 2009 Myoview EF of 28%  . TEE WITHOUT CARDIOVERSION N/A 08/16/2014   Procedure: TRANSESOPHAGEAL ECHOCARDIOGRAM (TEE);  Surgeon: [name], MD;  Location: [facility] ORS;  Service: Cardiovascular;  Laterality: N/A;   Family History  Problem Relation Age of Onset  . Hypertension Mother   . Hyperlipidemia Mother   . Diabetes Mother    Social History   Tobacco Use  . Smoking status: Former Smoker    Packs/day: 1.00    Years: 12.00    Pack years: 12.00    Quit date: 02/23/1998    Years since quitting: 20.5  . Smokeless tobacco: Never Used  Substance Use Topics  . Alcohol use: No    Alcohol/week: 0.0 standard  drinks    Comment: 4 heavy alcohol drinker. Quit 2 years ago.   Allergies  Allergen Reactions  . Esomeprazole Magnesium Other (See Comments)    Suspected interstitial nephritis 2018  . Eggs Or Egg-Derived Products Rash     Review of Systems  Constitutional: Negative for appetite change and fatigue.  HENT: Negative for congestion, postnasal drip and sore throat.   Eyes: Negative.   Respiratory: Negative for chest tightness, shortness of breath and wheezing.   Cardiovascular: Negative for chest pain, palpitations and leg swelling.  Gastrointestinal: Negative for abdominal distention and abdominal pain.  Endocrine: Negative.   Genitourinary: Negative.    Musculoskeletal: Positive for back pain. Negative for neck pain.  Skin: Negative.   Allergic/Immunologic: Negative.   Neurological: Negative for dizziness and light-headedness.  Hematological: Negative for adenopathy. Does not bruise/bleed easily.  Psychiatric/Behavioral: Negative for dysphoric mood and sleep disturbance (wearing CPAP nightly). The patient is not nervous/anxious.      Physical Exam  Constitutional: He is oriented to person, place, and time. He appears well-developed and well-nourished.  HENT:  Head: Normocephalic and atraumatic.  Neck: Normal range of motion. Neck supple. No JVD present.  Cardiovascular: Normal rate and regular rhythm.  Pulmonary/Chest: Effort normal. He has no wheezes. He has no rales.  Abdominal: Soft. He exhibits no distension. There is no abdominal tenderness.  Musculoskeletal:        General: No tenderness or edema.  Neurological: He is alert and oriented to person, place, and time.  Skin: Skin is warm and dry.  Psychiatric: He has a normal mood and affect. His behavior is normal. Thought content normal.  Nursing note and vitals reviewed.    Assessment & Plan:  1: Chronic heart failure with preserved ejection fraction- - NYHA Class I - euvolemic today - Reminded to call for an overnight weight gain of >2 pounds or a weekly weight gain of >5 pounds - weight 286.6 pounds from last visit here 7 months ago - has joined [employer] and goes twice weekly now - not adding salt. Reminded to keep salt under 2000 mg in a day - saw cardiology Sharolyn Douglas) 06/02/17 - walking to work (1/4 mile one way)  - BNP 03/07/14 was 4299  2: HTN- - BP - BMP from 06/02/17 reviewed and shows sodium 143, potassium 5.1, creatinine 2.10 and GFR 41 - saw nephrologist [facility]) 07/15/17  3: Obstructive sleep apnea- - sleeping well at night with CPAP machine - wears it 4-5 hours nightly at least 3 times/week; says that he doesn't wear it nightly because it "irritates him" -  does admit that he rests better on the nights that he wears it; encouraged him to wear it nightly as it would help with his BP  Patient did not bring his medications nor a list. Each medication was verbally reviewed with the patient and he was encouraged to bring the bottles to every visit to confirm accuracy of list.

## 2018-09-20 ENCOUNTER — Ambulatory Visit: Payer: Medicaid Other | Admitting: Family

## 2018-10-10 ENCOUNTER — Other Ambulatory Visit: Payer: Medicaid Other

## 2018-10-11 ENCOUNTER — Other Ambulatory Visit (INDEPENDENT_AMBULATORY_CARE_PROVIDER_SITE_OTHER): Payer: Medicaid Other

## 2018-10-11 ENCOUNTER — Other Ambulatory Visit: Payer: Medicaid Other

## 2018-10-11 ENCOUNTER — Other Ambulatory Visit: Payer: Self-pay

## 2018-10-11 DIAGNOSIS — E782 Mixed hyperlipidemia: Secondary | ICD-10-CM

## 2018-10-11 DIAGNOSIS — I480 Paroxysmal atrial fibrillation: Secondary | ICD-10-CM

## 2018-10-11 DIAGNOSIS — Z79899 Other long term (current) drug therapy: Secondary | ICD-10-CM

## 2018-10-12 LAB — COMPREHENSIVE METABOLIC PANEL
ALT: 16 IU/L (ref 0–44)
AST: 21 IU/L (ref 0–40)
Albumin/Globulin Ratio: 2 (ref 1.2–2.2)
Albumin: 4.2 g/dL (ref 3.8–4.9)
Alkaline Phosphatase: 71 IU/L (ref 39–117)
BUN/Creatinine Ratio: 9 (ref 9–20)
BUN: 20 mg/dL (ref 6–24)
Bilirubin Total: 0.5 mg/dL (ref 0.0–1.2)
CO2: 21 mmol/L (ref 20–29)
Calcium: 8.7 mg/dL (ref 8.7–10.2)
Chloride: 108 mmol/L — ABNORMAL HIGH (ref 96–106)
Creatinine, Ser: 2.29 mg/dL — ABNORMAL HIGH (ref 0.76–1.27)
GFR calc Af Amer: 36 mL/min/{1.73_m2} — ABNORMAL LOW (ref >59)
GFR calc non Af Amer: 31 mL/min/{1.73_m2} — ABNORMAL LOW (ref >59)
Globulin, Total: 2.1 g/dL (ref 1.5–4.5)
Glucose: 112 mg/dL — ABNORMAL HIGH (ref 65–99)
Potassium: 5 mmol/L (ref 3.5–5.2)
Sodium: 141 mmol/L (ref 134–144)
Total Protein: 6.3 g/dL (ref 6.0–8.5)

## 2018-10-12 LAB — LIPID PANEL
Chol/HDL Ratio: 6.9 ratio — ABNORMAL HIGH (ref 0.0–5.0)
Cholesterol, Total: 249 mg/dL — ABNORMAL HIGH (ref 100–199)
HDL: 36 mg/dL — ABNORMAL LOW (ref >39)
LDL Calculated: 174 mg/dL — ABNORMAL HIGH (ref 0–99)
Triglycerides: 194 mg/dL — ABNORMAL HIGH (ref 0–149)
VLDL Cholesterol Cal: 39 mg/dL (ref 5–40)

## 2018-10-12 LAB — TSH: TSH: 1.31 u[IU]/mL (ref 0.450–4.500)

## 2018-12-01 ENCOUNTER — Other Ambulatory Visit: Payer: Self-pay | Admitting: Family

## 2018-12-01 MED ORDER — APIXABAN 5 MG PO TABS: 5.0000 mg | ORAL_TABLET | Freq: Two times a day (BID) | ORAL | 5 refills | Status: DC

## 2018-12-14 ENCOUNTER — Ambulatory Visit: Payer: Medicaid Other | Admitting: Family

## 2018-12-21 NOTE — Progress Notes (Deleted)
[name], male    DOB: 1965-06-02, 53 y.o.   MRN: [id number]  HPI  Hector [name] is a 53 y/o male with a history of MI, hyperlipidemia, HTN, CKD, atrial fibrillation, alcohol use, cardiomyopathy, and CHF.   Echo done 12/03/16 showed an EF of 45-50% which is an improvement from previous echo which was done on 06/02/16 and showed an EF of 30-35%. Prior echo on 02/21/15 which showed a normalization of his EF which was 50-55% with mild Hector. No longer ICD candidate. Prior echo on 03/07/14 showed an EF of 20-25% with mild/mod Hector.   Last left heart cath was done 01/09/15.   He has not been admitted or been in the ED in the last 6 months.   He presents today with a chief complaint of a follow-up visit.   Past Medical History:  Diagnosis Date  . Alcohol abuse   . 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 12/2007 MV: EF 28%, no isch;  b. 8/12 Echo: EF 25-35%; c. 02/2014 Echo: EF 20-25%; d. 12/2014 Cath: minimal CAD; e. 01/2015 Echo: EF 50-55%;  d. 05/2016 Echo: EF 30-35%, diff HK, gr2 DD; e. 11/2016 Echo: EF 45-50%, diff HK.  . Chronic combined systolic (congestive) and diastolic (congestive) heart failure ([place])    a. 05/2016 Most recent Echo: EF 30-35%, diff HK, Gr2 DD, mild Hector, mod dil LA; b. 11/2016 Echo: EF 45-50%, diff HK.  . CKD (chronic kidney disease), stage III ([place])   . Elevated troponin    Chronically elevated. - level was 0.17-0.10 during recent admission  . Essential hypertension   . GI bleed 11/2013  . Hyperlipidemia   . Pancreatitis   . Paroxysmal A-fib ([place])    a. new onset s/p unsuccessful TEE/DCCV on 08/16/2014; b. on amio/eliquis (CHA2DS2VASc = 2-3); c. reports 1-2 hrs of afib ~ q61mos.  . Paroxysmal atrial flutter ([place])    a. new onset 07/2014; b. s/p unsuccessful TEE/DCCV 08/16/2014; c. on apixaban.  . Sleep-disordered breathing    Has yet to have a sleep study   Past Surgical History:  Procedure Laterality Date  . CARDIAC CATHETERIZATION N/A 01/09/2015    Procedure: Left Heart Cath and Coronary Angiography;  Surgeon: [name], MD;  Location: [place] [facility];  Service: Cardiovascular;  Laterality: N/A;  . ELECTROPHYSIOLOGIC STUDY N/A 08/16/2014   Procedure: CARDIOVERSION;  Surgeon: [name], MD;  Location: [facility] ORS;  Service: Cardiovascular;  Laterality: N/A;  . FLEXIBLE SIGMOIDOSCOPY N/A 10/10/2015   Procedure: FLEXIBLE SIGMOIDOSCOPY;  Surgeon: [name], MD;  Location: [facility] ENDOSCOPY;  Service: Endoscopy;  Laterality: N/A;  . KNEE SURGERY Right   . NM MYOVIEW LTD  November 2011   No ischemia or infarction. EF 50-55% ( no improvement from 2009 Myoview EF of 28%  . TEE WITHOUT CARDIOVERSION N/A 08/16/2014   Procedure: TRANSESOPHAGEAL ECHOCARDIOGRAM (TEE);  Surgeon: [name], MD;  Location: [facility] ORS;  Service: Cardiovascular;  Laterality: N/A;   Family History  Problem Relation Age of Onset  . Hypertension Mother   . Hyperlipidemia Mother   . Diabetes Mother    Social History   Tobacco Use  . Smoking status: Former Smoker    Packs/day: 1.00    Years: 12.00    Pack years: 12.00    Quit date: 02/23/1998    Years since quitting: 20.8  . Smokeless tobacco: Never Used  Substance Use Topics  . Alcohol use: No    Alcohol/week: 0.0  standard drinks    Comment: 4 heavy alcohol drinker. Quit 2 years ago.   Allergies  Allergen Reactions  . Esomeprazole Magnesium Other (See Comments)    Suspected interstitial nephritis 2018  . Eggs Or Egg-Derived Products Rash     Review of Systems  Constitutional: Negative for appetite change and fatigue.  HENT: Negative for congestion, postnasal drip and sore throat.   Eyes: Negative.   Respiratory: Negative for chest tightness, shortness of breath and wheezing.   Cardiovascular: Negative for chest pain, palpitations and leg swelling.  Gastrointestinal: Negative for abdominal distention and abdominal pain.  Endocrine: Negative.   Genitourinary: Negative.    Musculoskeletal: Positive for back pain. Negative for neck pain.  Skin: Negative.   Allergic/Immunologic: Negative.   Neurological: Negative for dizziness and light-headedness.  Hematological: Negative for adenopathy. Does not bruise/bleed easily.  Psychiatric/Behavioral: Negative for dysphoric mood and sleep disturbance (wearing CPAP nightly). The patient is not nervous/anxious.      Physical Exam  Constitutional: He is oriented to person, place, and time. He appears well-developed and well-nourished.  HENT:  Head: Normocephalic and atraumatic.  Neck: Normal range of motion. Neck supple. No JVD present.  Cardiovascular: Normal rate and regular rhythm.  Pulmonary/Chest: Effort normal. He has no wheezes. He has no rales.  Abdominal: Soft. He exhibits no distension. There is no abdominal tenderness.  Musculoskeletal:        General: No tenderness or edema.  Neurological: He is alert and oriented to person, place, and time.  Skin: Skin is warm and dry.  Psychiatric: He has a normal mood and affect. His behavior is normal. Thought content normal.  Nursing note and vitals reviewed.    Assessment & Plan:  1: Chronic heart failure with preserved ejection fraction- - NYHA Class I - euvolemic today - Reminded to call for an overnight weight gain of >2 pounds or a weekly weight gain of >5 pounds - weight 286.6 pounds from last visit here 11 months ago - has joined [employer] and goes twice weekly now - not adding salt. Reminded to keep salt under 2000 mg in a day - needs updated echo - had telemedicine visit with cardiology Hector [name]) 06/28/2018 - walking to work (1/4 mile one way)  - BNP 03/07/14 was 4299  2: HTN- - BP  - BMP from 10/11/2018 reviewed and shows sodium 141, potassium 5.0, creatinine 2.29 and GFR 36 - saw nephrologist [facility]) 07/15/17  3: Obstructive sleep apnea- - sleeping well at night with CPAP machine - wears it 4-5 hours nightly at least 3 times/week; says that he  doesn't wear it nightly because it "irritates him" - does admit that he rests better on the nights that he wears it; encouraged him to wear it nightly as it would help with his BP  Patient did not bring his medications nor a list. Each medication was verbally reviewed with the patient and he was encouraged to bring the bottles to every visit to confirm accuracy of list.

## 2018-12-22 ENCOUNTER — Ambulatory Visit: Payer: Medicaid Other | Admitting: Family

## 2019-01-03 NOTE — Progress Notes (Signed)
Patient ID: [name], male    DOB: 02-Mar-1965, 53 y.o.   MRN: [id number]  HPI  Hector [name] is [name] with [name] of MI, hyperlipidemia, HTN, CKD, atrial fibrillation, alcohol use, cardiomyopathy, and CHF.   Echo done 12/03/16 showed an EF of 45-50% which is an improvement from previous echo which was done on 06/02/16 and showed an EF of 30-35%. Prior echo on 02/21/15 which showed [name] normalization of his EF which was 50-55% with mild Hector. No longer ICD candidate. Prior echo on 03/07/14 showed an EF of 20-25% with mild/mod Hector.   Last left heart cath was done 01/09/15.   He has not been admitted or been in the ED in the last 6 months.   He presents today with [name] of [name] follow-up visit. He currently has not symptoms and specifically denies any difficulty sleeping, dizziness, abdominal distention, palpitations, pedal edema, chest pain, shortness of breath or fatigue. Does admit to [name] gradual weight gain as he's just "eating too much". Continues to work at [name].   Past Medical History:  Diagnosis Date  . Alcohol abuse   . 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 12/2007 MV: EF 28%, no isch;  b. 8/12 Echo: EF 25-35%; c. 02/2014 Echo: EF 20-25%; d. 12/2014 Cath: minimal CAD; e. 01/2015 Echo: EF 50-55%;  d. 05/2016 Echo: EF 30-35%, diff HK, gr2 DD; e. 11/2016 Echo: EF 45-50%, diff HK.  . Chronic combined systolic (congestive) and diastolic (congestive) heart failure ([place])    [name]. 05/2016 Most recent Echo: EF 30-35%, diff HK, Gr2 DD, mild Hector, mod dil LA; b. 11/2016 Echo: EF 45-50%, diff HK.  . CKD (chronic kidney disease), stage III ([place])   . Elevated troponin    Chronically elevated. - level was 0.17-0.10 during recent admission  . Essential hypertension   . GI bleed 11/2013  . Hyperlipidemia   . Pancreatitis   . Paroxysmal [name]-fib ([place])    [name]. new onset s/p unsuccessful TEE/DCCV on 08/16/2014; b. on amio/eliquis (CHA2DS2VASc = 2-3); c. reports 1-2 hrs  of afib ~ q68mos.  . Paroxysmal atrial flutter ([place])    [name]. new onset 07/2014; b. s/p unsuccessful TEE/DCCV 08/16/2014; c. on apixaban.  . Sleep-disordered breathing    Has yet to have [name] sleep study   Past Surgical History:  Procedure Laterality Date  . CARDIAC CATHETERIZATION N/[name] 01/09/2015   Procedure: Left Heart Cath and Coronary Angiography;  Surgeon: Hector Man, MD;  Location: [place] CV LAB;  Service: Cardiovascular;  Laterality: N/[name];  . ELECTROPHYSIOLOGIC STUDY N/[name] 08/16/2014   Procedure: CARDIOVERSION;  Surgeon: Hector Merritts, MD;  Location: [facility] ORS;  Service: Cardiovascular;  Laterality: N/[name];  . FLEXIBLE SIGMOIDOSCOPY N/[name] 10/10/2015   Procedure: FLEXIBLE SIGMOIDOSCOPY;  Surgeon: Hector Sails, MD;  Location: [facility] ENDOSCOPY;  Service: Endoscopy;  Laterality: N/[name];  . KNEE SURGERY Right   . NM MYOVIEW LTD  November 2011   No ischemia or infarction. EF 50-55% ( no improvement from 2009 Myoview EF of 28%  . TEE WITHOUT CARDIOVERSION N/[name] 08/16/2014   Procedure: TRANSESOPHAGEAL ECHOCARDIOGRAM (TEE);  Surgeon: Hector Merritts, MD;  Location: [facility] ORS;  Service: Cardiovascular;  Laterality: N/[name];   Family History  Problem Relation Age of Onset  . Hypertension Mother   . Hyperlipidemia Mother   . Diabetes Mother    Social History   Tobacco Use  . Smoking status: Former Smoker    Packs/day:  1.00    Years: 12.00    Pack years: 12.00    Quit date: 02/23/1998    Years since quitting: 20.8  . Smokeless tobacco: Never Used  Substance Use Topics  . Alcohol use: No    Alcohol/week: 0.0 standard drinks    Comment: 4 heavy alcohol drinker. Quit 2 years ago.   Allergies  Allergen Reactions  . Esomeprazole Magnesium Other (See Comments)    Suspected interstitial nephritis 2018  . Eggs Or Egg-Derived Products Rash   Prior to Admission medications   Medication Sig Start Date End Date Taking? Authorizing Provider  allopurinol (ZYLOPRIM) 100 MG tablet Take 100 mg by mouth  daily as needed.   Yes [provider]  amiodarone (PACERONE) 200 MG tablet Take 1 tablet (200 mg total) by mouth daily. 01/03/18  Yes Hector Price [name], Hector [name]  apixaban (ELIQUIS) 5 MG TABS tablet Take 1 tablet (5 mg total) by mouth 2 (two) times daily. 12/01/18  Yes Hector [name], Hector Kluver [name], Hector [name]  atorvastatin (LIPITOR) 80 MG tablet Take 1 tablet (80 mg total) by mouth daily. 03/15/18  Yes Hector Price [name], Hector [name]  carvedilol (COREG) 3.125 MG tablet Take 1 tablet (3.125 mg total) by mouth 2 (two) times daily with [name] meal. 03/15/18  Yes Hector Price [name], Hector [name]  furosemide (LASIX) 40 MG tablet Take 1 tablet (40 mg total) by mouth daily. 05/13/18  Yes Hector [name], Hector Haber, Hector [name]  levothyroxine (SYNTHROID, LEVOTHROID) 100 MCG tablet Take 100 mcg by mouth daily before breakfast.   Yes [provider]  sacubitril-valsartan (ENTRESTO) 97-103 MG Take 1 tablet by mouth 2 (two) times daily. 06/20/18  Yes Hector Graff, Hector [name]     Review of Systems  Constitutional: Negative for appetite change and fatigue.  HENT: Negative for congestion, postnasal drip and sore throat.   Eyes: Negative.   Respiratory: Negative for chest tightness, shortness of breath and wheezing.   Cardiovascular: Negative for chest pain, palpitations and leg swelling.  Gastrointestinal: Negative for abdominal distention and abdominal pain.  Endocrine: Negative.   Genitourinary: Negative.   Musculoskeletal: Negative for back pain and neck pain.  Skin: Negative.   Allergic/Immunologic: Negative.   Neurological: Negative for dizziness and light-headedness.  Hematological: Negative for adenopathy. Does not bruise/bleed easily.  Psychiatric/Behavioral: Negative for dysphoric mood and sleep disturbance (wearing CPAP nightly). The patient is not nervous/anxious.     Vitals:   01/04/19 0846  BP: (!) 142/103  Pulse: 65  Resp: 20  SpO2: 94%  Weight: (!) 301 lb (136.5 kg)  Height: 5\' 11"  (1.803 m)   Wt Readings from Last 3 Encounters:  01/04/19 (!)  301 lb (136.5 kg)  01/24/18 286 lb 6 oz (129.9 kg)  09/17/17 284 lb (128.8 kg)   Lab Results  Component Value Date   CREATININE 2.29 (H) 10/11/2018   CREATININE 2.10 (H) 06/02/2017   CREATININE 1.83 (H) 03/24/2017    Physical Exam  Constitutional: He is oriented to person, place, and time. He appears well-developed and well-nourished.  HENT:  Head: Normocephalic and atraumatic.  Neck: Normal range of motion. Neck supple. No JVD present.  Cardiovascular: Normal rate and regular rhythm.  Pulmonary/Chest: Effort normal. He has no wheezes. He has no rales.  Abdominal: Soft. He exhibits no distension. There is no abdominal tenderness.  Musculoskeletal:        General: No tenderness or edema.  Neurological: He is [name] and oriented to person, place, and time.  Skin: Skin is warm and dry.  Psychiatric:  He has [name] normal mood and affect. His behavior is normal. Thought content normal.  Nursing note and vitals reviewed.    Assessment & Plan:  1: Chronic heart failure with preserved ejection fraction- - NYHA Class I - euvolemic today - Reminded to call for an overnight weight gain of >2 pounds or [name] weekly weight gain of >5 pounds - weight up 14 pounds from last visit here 11 months ago - trying to be active; continues to work at [employer] - not adding salt. Reminded to keep salt under 2000 mg in [name] day - had telemedicine visit with cardiology Hector [name]) 06/28/2018 - walking to work (1/4 mile one way)  - BNP 03/07/14 was 4299 - reports receiving his flu vaccine for the season  2: HTN- - BP mildly elevated but he says that he just took his medications prior to coming - saw PCP Hector [name]) within the last couple of months - BMP from 10/11/2018 reviewed and shows sodium 141, potassium 5.0, creatinine 2.29 and GFR 36 - saw nephrologist Hector [name]) 07/15/17 & returns December 2020  3: Obstructive sleep apnea- - sleeping well at night with CPAP machine - wears it 4-5 hours nightly every night -  does admit that he rests better on the nights that he wears it; encouraged him to wear it nightly as it would help with his BP  Patient did not bring his medications nor [name] list. Each medication was verbally reviewed with the patient and he was encouraged to bring the bottles to every visit to confirm accuracy of list.  Will not make [name] return appointment for patient at this time. Advised him that he could call back at anytime to schedule another appointment.

## 2019-01-04 ENCOUNTER — Other Ambulatory Visit: Payer: Self-pay

## 2019-01-04 ENCOUNTER — Encounter: Payer: Self-pay | Admitting: Family

## 2019-01-04 ENCOUNTER — Ambulatory Visit: Payer: Medicaid Other | Attending: Family | Admitting: Family

## 2019-01-04 VITALS — BP 142/103 | HR 65 | Resp 20 | Ht 71.0 in | Wt 301.0 lb

## 2019-01-04 DIAGNOSIS — N189 Chronic kidney disease, unspecified: Secondary | ICD-10-CM | POA: Diagnosis not present

## 2019-01-04 DIAGNOSIS — G4733 Obstructive sleep apnea (adult) (pediatric): Secondary | ICD-10-CM | POA: Diagnosis not present

## 2019-01-04 DIAGNOSIS — I252 Old myocardial infarction: Secondary | ICD-10-CM | POA: Diagnosis not present

## 2019-01-04 DIAGNOSIS — I5032 Chronic diastolic (congestive) heart failure: Secondary | ICD-10-CM | POA: Diagnosis present

## 2019-01-04 DIAGNOSIS — I130 Hypertensive heart and chronic kidney disease with heart failure and stage 1 through stage 4 chronic kidney disease, or unspecified chronic kidney disease: Secondary | ICD-10-CM | POA: Insufficient documentation

## 2019-01-04 DIAGNOSIS — I10 Essential (primary) hypertension: Secondary | ICD-10-CM

## 2019-01-04 DIAGNOSIS — I480 Paroxysmal atrial fibrillation: Secondary | ICD-10-CM | POA: Insufficient documentation

## 2019-01-04 DIAGNOSIS — E785 Hyperlipidemia, unspecified: Secondary | ICD-10-CM | POA: Insufficient documentation

## 2019-01-04 DIAGNOSIS — Z7901 Long term (current) use of anticoagulants: Secondary | ICD-10-CM | POA: Diagnosis not present

## 2019-01-04 DIAGNOSIS — Z87891 Personal history of nicotine dependence: Secondary | ICD-10-CM | POA: Diagnosis not present

## 2019-01-04 DIAGNOSIS — Z79899 Other long term (current) drug therapy: Secondary | ICD-10-CM | POA: Diagnosis not present

## 2019-01-04 DIAGNOSIS — Z8249 Family history of ischemic heart disease and other diseases of the circulatory system: Secondary | ICD-10-CM | POA: Insufficient documentation

## 2019-01-04 NOTE — Patient Instructions (Addendum)
Continue weighing daily and call for an overnight weight gain of > 2 pounds or a weekly weight gain of >5 pounds.  Call to make an appointment if needed in the future.

## 2019-03-17 ENCOUNTER — Other Ambulatory Visit: Payer: Self-pay | Admitting: Family

## 2019-03-17 MED ORDER — ENTRESTO 97-103 MG PO TABS: 1.0000 | ORAL_TABLET | Freq: Two times a day (BID) | ORAL | 5 refills | Status: DC

## 2019-06-26 ENCOUNTER — Ambulatory Visit: Payer: Medicaid Other | Admitting: Family

## 2019-07-04 NOTE — Progress Notes (Deleted)
Office Visit    Patient Name: Hector [name] Date of Encounter: 07/04/2019  Primary Care Provider:  [name], MD Primary Cardiologist:  No primary care provider on file. Electrophysiologist:  None   Chief Complaint    Hector [name] is a 54 y.o. male with a hx of NICM possibly EtOH related, chronic systolic and diastolic heart failure, nonobstructive CAD, paroxysmal atrial fib/flutter, HTN, obesity, possible sleep apnea presents today for ***   Past Medical History    Past Medical History:  Diagnosis Date  . Alcohol abuse   . 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 12/2007 MV: EF 28%, no isch;  b. 8/12 Echo: EF 25-35%; c. 02/2014 Echo: EF 20-25%; d. 12/2014 Cath: minimal CAD; e. 01/2015 Echo: EF 50-55%;  d. 05/2016 Echo: EF 30-35%, diff HK, gr2 DD; e. 11/2016 Echo: EF 45-50%, diff HK.  . Chronic combined systolic (congestive) and diastolic (congestive) heart failure ([place])    a. 05/2016 Most recent Echo: EF 30-35%, diff HK, Gr2 DD, mild MR, mod dil LA; b. 11/2016 Echo: EF 45-50%, diff HK.  . CKD (chronic kidney disease), stage III   . Elevated troponin    Chronically elevated. - level was 0.17-0.10 during recent admission  . Essential hypertension   . GI bleed 11/2013  . Hyperlipidemia   . Pancreatitis   . Paroxysmal A-fib ([place])    a. new onset s/p unsuccessful TEE/DCCV on 08/16/2014; b. on amio/eliquis (CHA2DS2VASc = 2-3); c. reports 1-2 hrs of afib ~ q75mos.  . Paroxysmal atrial flutter ([place])    a. new onset 07/2014; b. s/p unsuccessful TEE/DCCV 08/16/2014; c. on apixaban.  . Sleep-disordered breathing    Has yet to have a sleep study   Past Surgical History:  Procedure Laterality Date  . CARDIAC CATHETERIZATION N/A 01/09/2015   Procedure: Left Heart Cath and Coronary Angiography;  Surgeon: [name], MD;  Location: [place] CV LAB;  Service: Cardiovascular;  Laterality: N/A;  . ELECTROPHYSIOLOGIC STUDY N/A 08/16/2014   Procedure:  CARDIOVERSION;  Surgeon: [name], MD;  Location: [facility] ORS;  Service: Cardiovascular;  Laterality: N/A;  . FLEXIBLE SIGMOIDOSCOPY N/A 10/10/2015   Procedure: FLEXIBLE SIGMOIDOSCOPY;  Surgeon: [name], MD;  Location: [facility] ENDOSCOPY;  Service: Endoscopy;  Laterality: N/A;  . KNEE SURGERY Right   . NM MYOVIEW LTD  November 2011   No ischemia or infarction. EF 50-55% ( no improvement from 2009 Myoview EF of 28%  . TEE WITHOUT CARDIOVERSION N/A 08/16/2014   Procedure: TRANSESOPHAGEAL ECHOCARDIOGRAM (TEE);  Surgeon: [name], MD;  Location: [facility] ORS;  Service: Cardiovascular;  Laterality: N/A;    Allergies  Allergies  Allergen Reactions  . Esomeprazole Magnesium Other (See Comments)    Suspected interstitial nephritis 2018  . Eggs Or Egg-Derived Products Rash    History of Present Illness    Hector [name] is a 54 y.o. male with a hx of *** last seen by Darylene Price, NP in the heart failure clinic on 01/04/2019.  He had reduced EF of 20% dating back to 2009.  Repeat echo cardiogram 2012 the same.  Cardiac catheterization in 2016 showing nonobstructive CAD.  Diagnosed with atrial flutter and cardioverted in June 2016.  He reverted to atrial fibrillation was placed on amiodarone and Eliquis.  Seen by EP in 2016 for LV dysfunction-echo at that time showed recovery of LV function and ICD not recommended.  Echo 05/2016 with recurrent LV dysfunction with EF 30-35% diffuse  hypokinesis in the setting of noncompliance to medications.  Echo 11/2016 LVEF 45 to 50%.   Follows with nephrology.  ***  EKGs/Labs/Other Studies Reviewed:   The following studies were reviewed today: ***  EKG:  EKG is *** ordered today.  The ekg ordered today demonstrates ***  Recent Labs: 10/11/2018: ALT 16; BUN 20; Creatinine, Ser 2.29; Potassium 5.0; Sodium 141; TSH 1.310  Recent Lipid Panel    Component Value Date/Time   CHOL 249 (H) 10/11/2018 1226   CHOL 118 03/08/2014 0410   TRIG  194 (H) 10/11/2018 1226   TRIG 95 03/08/2014 0410   HDL 36 (L) 10/11/2018 1226   HDL 28 (L) 03/08/2014 0410   CHOLHDL 6.9 (H) 10/11/2018 1226   CHOLHDL 6.9 12/12/2015 0949   VLDL 44 (H) 12/12/2015 0949   VLDL 19 03/08/2014 0410   LDLCALC 174 (H) 10/11/2018 1226   LDLCALC 71 03/08/2014 0410    Home Medications   No outpatient medications have been marked as taking for the 07/05/19 encounter (Appointment) with Loel Dubonnet, NP.      Review of Systems    ***   ROS All other systems reviewed and are otherwise negative except as noted above.  Physical Exam    VS:  There were no vitals taken for this visit. , BMI There is no height or weight on file to calculate BMI. GEN: Well nourished, well developed, in no acute distress. HEENT: normal. Neck: Supple, no JVD, carotid bruits, or masses. Cardiac: ***RRR, no murmurs, rubs, or gallops. No clubbing, cyanosis, edema.  ***Radials/DP/PT 2+ and equal bilaterally.  Respiratory:  ***Respirations regular and unlabored, clear to auscultation bilaterally. GI: Soft, nontender, nondistended, BS + x 4. MS: No deformity or atrophy. Skin: Warm and dry, no rash. Neuro:  Strength and sensation are intact. Psych: Normal affect.  Accessory Clinical Findings    ECG personally reviewed by me today - *** - no acute changes.  Assessment & Plan    1. ***  Disposition: Follow up {follow up:15908} with ***   Loel Dubonnet, NP 07/04/2019, 8:21 PM

## 2019-07-05 ENCOUNTER — Ambulatory Visit: Payer: Medicaid Other | Admitting: Family

## 2019-07-06 ENCOUNTER — Encounter: Payer: Self-pay | Admitting: Family

## 2019-10-30 ENCOUNTER — Encounter: Payer: Self-pay | Admitting: Emergency Medicine

## 2019-10-30 ENCOUNTER — Other Ambulatory Visit: Payer: Self-pay

## 2019-10-30 ENCOUNTER — Emergency Department
Admission: EM | Admit: 2019-10-30 | Discharge: 2019-10-30 | Disposition: A | Discharge status: Home / Self Care | Payer: Medicaid Other | Attending: Emergency Medicine | Admitting: Emergency Medicine

## 2019-10-30 DIAGNOSIS — M109 Gout, unspecified: Secondary | ICD-10-CM

## 2019-10-30 DIAGNOSIS — M10072 Idiopathic gout, left ankle and foot: Secondary | ICD-10-CM | POA: Insufficient documentation

## 2019-10-30 DIAGNOSIS — E1122 Type 2 diabetes mellitus with diabetic chronic kidney disease: Secondary | ICD-10-CM | POA: Diagnosis not present

## 2019-10-30 DIAGNOSIS — I130 Hypertensive heart and chronic kidney disease with heart failure and stage 1 through stage 4 chronic kidney disease, or unspecified chronic kidney disease: Secondary | ICD-10-CM | POA: Insufficient documentation

## 2019-10-30 DIAGNOSIS — M10071 Idiopathic gout, right ankle and foot: Secondary | ICD-10-CM | POA: Diagnosis not present

## 2019-10-30 DIAGNOSIS — Z87891 Personal history of nicotine dependence: Secondary | ICD-10-CM | POA: Insufficient documentation

## 2019-10-30 DIAGNOSIS — I5042 Chronic combined systolic (congestive) and diastolic (congestive) heart failure: Secondary | ICD-10-CM | POA: Diagnosis not present

## 2019-10-30 DIAGNOSIS — M79671 Pain in right foot: Secondary | ICD-10-CM | POA: Diagnosis present

## 2019-10-30 DIAGNOSIS — Z79899 Other long term (current) drug therapy: Secondary | ICD-10-CM | POA: Insufficient documentation

## 2019-10-30 DIAGNOSIS — N183 Chronic kidney disease, stage 3 unspecified: Secondary | ICD-10-CM | POA: Insufficient documentation

## 2019-10-30 DIAGNOSIS — Z7901 Long term (current) use of anticoagulants: Secondary | ICD-10-CM | POA: Insufficient documentation

## 2019-10-30 MED ORDER — PREDNISONE 20 MG PO TABS: ORAL_TABLET | ORAL | 0 refills | Status: DC

## 2019-10-30 MED ORDER — HYDROCODONE-ACETAMINOPHEN 5-325 MG PO TABS: 1.0000 | ORAL_TABLET | Freq: Four times a day (QID) | ORAL | 0 refills | Status: DC | PRN

## 2019-10-30 NOTE — ED Triage Notes (Signed)
C/O bilateral foot and knee gout pain x 4 days.

## 2019-10-30 NOTE — Discharge Instructions (Signed)
Follow-up with your primary care provider if any continued problems or concerns.  Begin taking medication as directed and continue taking your allopurinol.  A prescription for Norco was sent to the pharmacy as needed for pain.  Also prednisone which she had taken in the past.

## 2019-10-30 NOTE — ED Triage Notes (Signed)
Pt comes into the ED via EMS from home with c/o BL foot pain with a hx of gout

## 2019-10-30 NOTE — ED Provider Notes (Signed)
[facility] Emergency Department Provider Note   ____________________________________________   First MD Initiated Contact with Patient 10/30/19 1524     (approximate)  I have reviewed the triage vital signs and the nursing notes.   HISTORY  Chief Complaint Gout   HPI Hector [name] is a 54 y.o. male Libby Maw to the ED with complaint of bilateral feet pain.  Patient also complains of left knee pain.  He normally takes allopurinol prevent gout attacks however he admits that he discontinued taking it approximately 2 to 3 weeks ago and then restarted it once his gout started acting up.  Patient has history of chronic kidney disease stage III and does not take over-the-counter medication.  He rates his pain as an 8 out of 10.         Past Medical History:  Diagnosis Date  . Alcohol abuse   . 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 12/2007 MV: EF 28%, no isch;  b. 8/12 Echo: EF 25-35%; c. 02/2014 Echo: EF 20-25%; d. 12/2014 Cath: minimal CAD; e. 01/2015 Echo: EF 50-55%;  d. 05/2016 Echo: EF 30-35%, diff HK, gr2 DD; e. 11/2016 Echo: EF 45-50%, diff HK.  . Chronic combined systolic (congestive) and diastolic (congestive) heart failure ([place])    a. 05/2016 Most recent Echo: EF 30-35%, diff HK, Gr2 DD, mild MR, mod dil LA; b. 11/2016 Echo: EF 45-50%, diff HK.  . CKD (chronic kidney disease), stage III   . Elevated troponin    Chronically elevated. - level was 0.17-0.10 during recent admission  . Essential hypertension   . GI bleed 11/2013  . Hyperlipidemia   . Pancreatitis   . Paroxysmal A-fib ([place])    a. new onset s/p unsuccessful TEE/DCCV on 08/16/2014; b. on amio/eliquis (CHA2DS2VASc = 2-3); c. reports 1-2 hrs of afib ~ q39mos.  . Paroxysmal atrial flutter ([place])    a. new onset 07/2014; b. s/p unsuccessful TEE/DCCV 08/16/2014; c. on apixaban.  . Sleep-disordered breathing    Has yet to have a sleep study    Patient Active Problem List   Diagnosis Date  Noted  . Chronic systolic heart failure ([place]) 07/21/2016  . Obstructive sleep apnea 04/21/2016  . Nonischemic cardiomyopathy ([place]) 05/26/2015  . Chronic diastolic heart failure ([place]) 04/01/2015  . Essential hypertension 04/01/2015  . Sinus bradycardia 01/29/2015  . Paroxysmal atrial fibrillation ([place]) 08/16/2014  . Paroxysmal atrial flutter ([place]) 08/14/2014    Class: Acute  . Chronic kidney disease (CKD), stage III (moderate) 07/02/2014  . Hypertensive heart disease   . Hyperlipidemia     Past Surgical History:  Procedure Laterality Date  . CARDIAC CATHETERIZATION N/A 01/09/2015   Procedure: Left Heart Cath and Coronary Angiography;  Surgeon: [name], MD;  Location: [place] CV LAB;  Service: Cardiovascular;  Laterality: N/A;  . ELECTROPHYSIOLOGIC STUDY N/A 08/16/2014   Procedure: CARDIOVERSION;  Surgeon: [name], MD;  Location: [facility] ORS;  Service: Cardiovascular;  Laterality: N/A;  . FLEXIBLE SIGMOIDOSCOPY N/A 10/10/2015   Procedure: FLEXIBLE SIGMOIDOSCOPY;  Surgeon: [name], MD;  Location: [facility] ENDOSCOPY;  Service: Endoscopy;  Laterality: N/A;  . KNEE SURGERY Right   . NM MYOVIEW LTD  November 2011   No ischemia or infarction. EF 50-55% ( no improvement from 2009 Myoview EF of 28%  . TEE WITHOUT CARDIOVERSION N/A 08/16/2014   Procedure: TRANSESOPHAGEAL ECHOCARDIOGRAM (TEE);  Surgeon: [name], MD;  Location: [facility] ORS;  Service: Cardiovascular;  Laterality: N/A;  Prior to Admission medications   Medication Sig Start Date End Date Taking? Authorizing Provider  allopurinol (ZYLOPRIM) 100 MG tablet Take 100 mg by mouth daily as needed.    [provider]  amiodarone (PACERONE) 200 MG tablet Take 1 tablet (200 mg total) by mouth daily. 01/03/18   Alisa Graff, FNP  apixaban (ELIQUIS) 5 MG TABS tablet Take 1 tablet (5 mg total) by mouth 2 (two) times daily. 12/01/18   Alisa Graff, FNP  atorvastatin (LIPITOR) 80 MG tablet Take 1  tablet (80 mg total) by mouth daily. 03/15/18   Alisa Graff, FNP  carvedilol (COREG) 3.125 MG tablet Take 1 tablet (3.125 mg total) by mouth 2 (two) times daily with a meal. 03/15/18   Alisa Graff, FNP  furosemide (LASIX) 40 MG tablet Take 1 tablet (40 mg total) by mouth daily. 05/13/18   Rise Mu, PA-C  HYDROcodone-acetaminophen (NORCO/VICODIN) 5-325 MG tablet Take 1 tablet by mouth every 6 (six) hours as needed for moderate pain. 10/30/19   Johnn Hai, PA-C  levothyroxine (SYNTHROID, LEVOTHROID) 100 MCG tablet Take 100 mcg by mouth daily before breakfast.    [provider]  predniSONE (DELTASONE) 20 MG tablet Take 2 tablets for 5 days and then 1 tablet once a day for 5 days 10/30/19   Johnn Hai, PA-C  sacubitril-valsartan (ENTRESTO) 97-103 MG Take 1 tablet by mouth 2 (two) times daily. 03/17/19   Alisa Graff, FNP    Allergies Esomeprazole magnesium and Eggs or egg-derived products  Family History  Problem Relation Age of Onset  . Hypertension Mother   . Hyperlipidemia Mother   . Diabetes Mother     Social History Social History   Tobacco Use  . Smoking status: Former Smoker    Packs/day: 1.00    Years: 12.00    Pack years: 12.00    Quit date: 02/23/1998    Years since quitting: 21.6  . Smokeless tobacco: Never Used  Vaping Use  . Vaping Use: Never used  Substance Use Topics  . Alcohol use: No    Alcohol/week: 0.0 standard drinks    Comment: 4 heavy alcohol drinker. Quit 2 years ago.  . Drug use: No    Review of Systems Constitutional: No fever/chills Eyes: No visual changes. Cardiovascular: Denies chest pain. Respiratory: Denies shortness of breath. Gastrointestinal: No abdominal pain.  No nausea, no vomiting. Musculoskeletal: Bilateral lateral pain and left knee pain. Skin: Negative for rash. Neurological: Negative for headaches, focal weakness or numbness. ____________________________________________   PHYSICAL EXAM:  VITAL  SIGNS: ED Triage Vitals  Enc Vitals Group     BP 10/30/19 1402 (!) 136/94     Pulse Rate 10/30/19 1402 93     Resp 10/30/19 1402 20     Temp 10/30/19 1402 99.5 F (37.5 C)     Temp Source 10/30/19 1402 Oral     SpO2 10/30/19 1402 96 %     Weight 10/30/19 1404 285 lb (129.3 kg)     Height 10/30/19 1404 5\' 11"  (1.803 m)     Head Circumference --      Peak Flow --      Pain Score 10/30/19 1406 8     Pain Loc --      Pain Edu? --      Excl. in [place]? --     Constitutional: Alert and oriented. Well appearing and in no acute distress. Eyes: Conjunctivae are normal.  Head: Atraumatic. Neck: No  stridor.   Cardiovascular: Normal rate, regular rhythm. Grossly normal heart sounds.  Good peripheral circulation. Respiratory: Normal respiratory effort.  No retractions. Lungs CTAB. Gastrointestinal: Soft and nontender. No distention.  Musculoskeletal: There is diffuse tenderness on palpation of the feet bilaterally but no gross deformity.  Left ankle is slightly warm and moderately tender.  There is no erythema noted on examination of the left knee and no effusion noted on palpation of the patella. Neurologic:  Normal speech and language. No gross focal neurologic deficits are appreciated. No gait instability. Skin:  Skin is warm, dry and intact. No rash noted. Psychiatric: Mood and affect are normal. Speech and behavior are normal.  ____________________________________________   LABS (all labs ordered are listed, but only abnormal results are displayed)  Labs Reviewed - No data to display ____________________________________________    PROCEDURES  Procedure(s) performed (including Critical Care):  Procedures   ____________________________________________   INITIAL IMPRESSION / ASSESSMENT AND PLAN / ED COURSE  As part of my medical decision making, I reviewed the following data within the electronic MEDICAL RECORD NUMBER Notes from prior ED visits and [place] Controlled Substance  Database    Hector [name] was evaluated in Emergency Department on 10/30/2019 for the symptoms described in the history of present illness. He was evaluated in the context of the global COVID-19 pandemic, which necessitated consideration that the patient might be at risk for infection with the SARS-CoV-2 virus that causes COVID-19. Institutional protocols and algorithms that pertain to the evaluation of patients at risk for COVID-19 are in a state of rapid change based on information released by regulatory bodies including the CDC and federal and state organizations. These policies and algorithms were followed during the patient's care in the ED.  54 year old male presents to the ED with complaint of bilateral feet pain along with left knee pain.  Patient has a history of chronic kidney disease and does not take over-the-counter medication.  He has a history of gout and reports that he discontinued taking his allopurinol 2 to 3 weeks ago.  He states he restarted it after his gout symptoms returned without any relief.  Patient was given a prescription for hydrocodone and prednisone 40 mg for 5 days and then 20 mg for 5 days.  Patient is to follow-up with his PCP if any continued problems or concerns.  ____________________________________________   FINAL CLINICAL IMPRESSION(S) / ED DIAGNOSES  Final diagnoses:  Gouty arthritis of both feet     ED Discharge Orders         Ordered    HYDROcodone-acetaminophen (NORCO/VICODIN) 5-325 MG tablet  Every 6 hours PRN        10/30/19 1602    predniSONE (DELTASONE) 20 MG tablet  Status:  Discontinued        10/30/19 1609    predniSONE (DELTASONE) 20 MG tablet        10/30/19 1610           Note:  This document was prepared using Dragon voice recognition software and may include unintentional dictation errors.    Johnn Hai, PA-C 10/30/19 1631    Harvest Dark, MD 11/09/19 [phone]

## 2019-10-30 NOTE — ED Notes (Signed)
See triage note  Presents with pain to both feet  Hx of gout  States pain started couple of days ago  Now has moved into both feet

## 2019-12-13 ENCOUNTER — Emergency Department
Admission: EM | Admit: 2019-12-13 | Discharge: 2019-12-13 | Disposition: A | Discharge status: Home / Self Care | Payer: Medicaid Other | Attending: Emergency Medicine | Admitting: Emergency Medicine

## 2019-12-13 ENCOUNTER — Emergency Department: Payer: Medicaid Other

## 2019-12-13 ENCOUNTER — Other Ambulatory Visit: Payer: Self-pay

## 2019-12-13 ENCOUNTER — Encounter: Payer: Self-pay | Admitting: Emergency Medicine

## 2019-12-13 DIAGNOSIS — M5412 Radiculopathy, cervical region: Secondary | ICD-10-CM | POA: Insufficient documentation

## 2019-12-13 DIAGNOSIS — Z87891 Personal history of nicotine dependence: Secondary | ICD-10-CM | POA: Diagnosis not present

## 2019-12-13 DIAGNOSIS — R079 Chest pain, unspecified: Secondary | ICD-10-CM | POA: Insufficient documentation

## 2019-12-13 DIAGNOSIS — N183 Chronic kidney disease, stage 3 unspecified: Secondary | ICD-10-CM | POA: Diagnosis not present

## 2019-12-13 DIAGNOSIS — I5042 Chronic combined systolic (congestive) and diastolic (congestive) heart failure: Secondary | ICD-10-CM | POA: Insufficient documentation

## 2019-12-13 DIAGNOSIS — R202 Paresthesia of skin: Secondary | ICD-10-CM | POA: Insufficient documentation

## 2019-12-13 DIAGNOSIS — M79622 Pain in left upper arm: Secondary | ICD-10-CM | POA: Insufficient documentation

## 2019-12-13 DIAGNOSIS — M542 Cervicalgia: Secondary | ICD-10-CM | POA: Diagnosis present

## 2019-12-13 DIAGNOSIS — Z79899 Other long term (current) drug therapy: Secondary | ICD-10-CM | POA: Insufficient documentation

## 2019-12-13 DIAGNOSIS — I130 Hypertensive heart and chronic kidney disease with heart failure and stage 1 through stage 4 chronic kidney disease, or unspecified chronic kidney disease: Secondary | ICD-10-CM | POA: Insufficient documentation

## 2019-12-13 LAB — COMPREHENSIVE METABOLIC PANEL
ALT: 11 U/L (ref 0–44)
AST: 15 U/L (ref 15–41)
Albumin: 3.8 g/dL (ref 3.5–5.0)
Alkaline Phosphatase: 51 U/L (ref 38–126)
Anion gap: 11 (ref 5–15)
BUN: 22 mg/dL — ABNORMAL HIGH (ref 6–20)
CO2: 22 mmol/L (ref 22–32)
Calcium: 8.4 mg/dL — ABNORMAL LOW (ref 8.9–10.3)
Chloride: 105 mmol/L (ref 98–111)
Creatinine, Ser: 2.45 mg/dL — ABNORMAL HIGH (ref 0.61–1.24)
GFR, Estimated: 29 mL/min — ABNORMAL LOW (ref >60)
Glucose, Bld: 141 mg/dL — ABNORMAL HIGH (ref 70–99)
Potassium: 4.4 mmol/L (ref 3.5–5.1)
Sodium: 138 mmol/L (ref 135–145)
Total Bilirubin: 0.7 mg/dL (ref 0.3–1.2)
Total Protein: 6.4 g/dL — ABNORMAL LOW (ref 6.5–8.1)

## 2019-12-13 LAB — CBC WITH DIFFERENTIAL/PLATELET
Abs Immature Granulocytes: 0.03 10*3/uL (ref 0.00–0.07)
Basophils Absolute: 0 10*3/uL (ref 0.0–0.1)
Basophils Relative: 0 %
Eosinophils Absolute: 0 10*3/uL (ref 0.0–0.5)
Eosinophils Relative: 1 %
HCT: 43.9 % (ref 39.0–52.0)
Hemoglobin: 14.8 g/dL (ref 13.0–17.0)
Immature Granulocytes: 1 %
Lymphocytes Relative: 15 %
Lymphs Abs: 0.9 10*3/uL (ref 0.7–4.0)
MCH: 29.5 pg (ref 26.0–34.0)
MCHC: 33.7 g/dL (ref 30.0–36.0)
MCV: 87.5 fL (ref 80.0–100.0)
Monocytes Absolute: 0.6 10*3/uL (ref 0.1–1.0)
Monocytes Relative: 11 %
Neutro Abs: 4.1 10*3/uL (ref 1.7–7.7)
Neutrophils Relative %: 72 %
Platelets: 148 10*3/uL — ABNORMAL LOW (ref 150–400)
RBC: 5.02 MIL/uL (ref 4.22–5.81)
RDW: 13.3 % (ref 11.5–15.5)
WBC: 5.6 10*3/uL (ref 4.0–10.5)
nRBC: 0 % (ref 0.0–0.2)

## 2019-12-13 LAB — TROPONIN I (HIGH SENSITIVITY)
Troponin I (High Sensitivity): 53 ng/L — ABNORMAL HIGH (ref <18)
Troponin I (High Sensitivity): 38 ng/L — ABNORMAL HIGH (ref <18)

## 2019-12-13 IMAGING — CR DG CHEST 2V
1 series · 2 of 2 positions shown · non-contrast
Comparison: [DATE].

CLINICAL DATA: Chest pain.

EXAM:
CHEST - 2 VIEW

[Series 1: dg chest 2 view · 0.14mm/px · 2 of 2 slices shown]
[im 1/2]
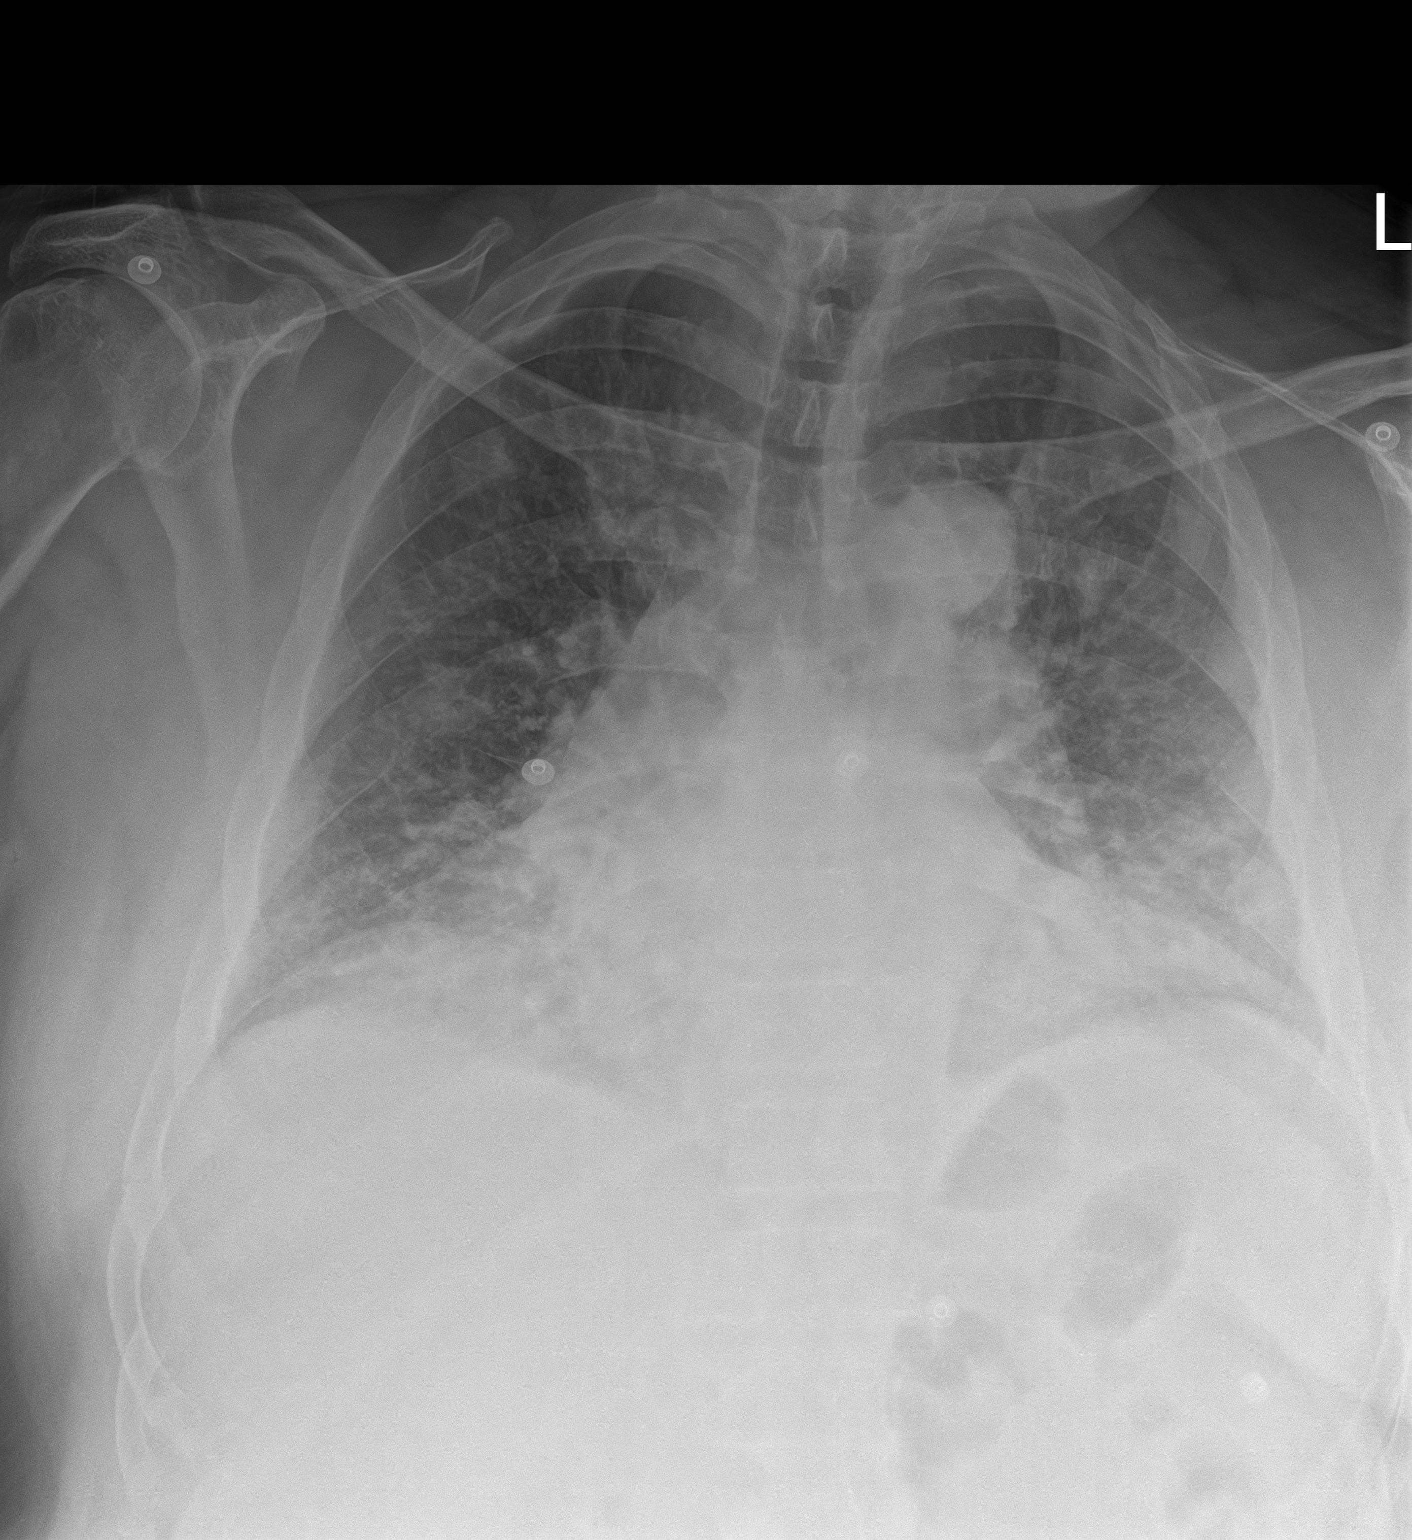
[im 2/2]
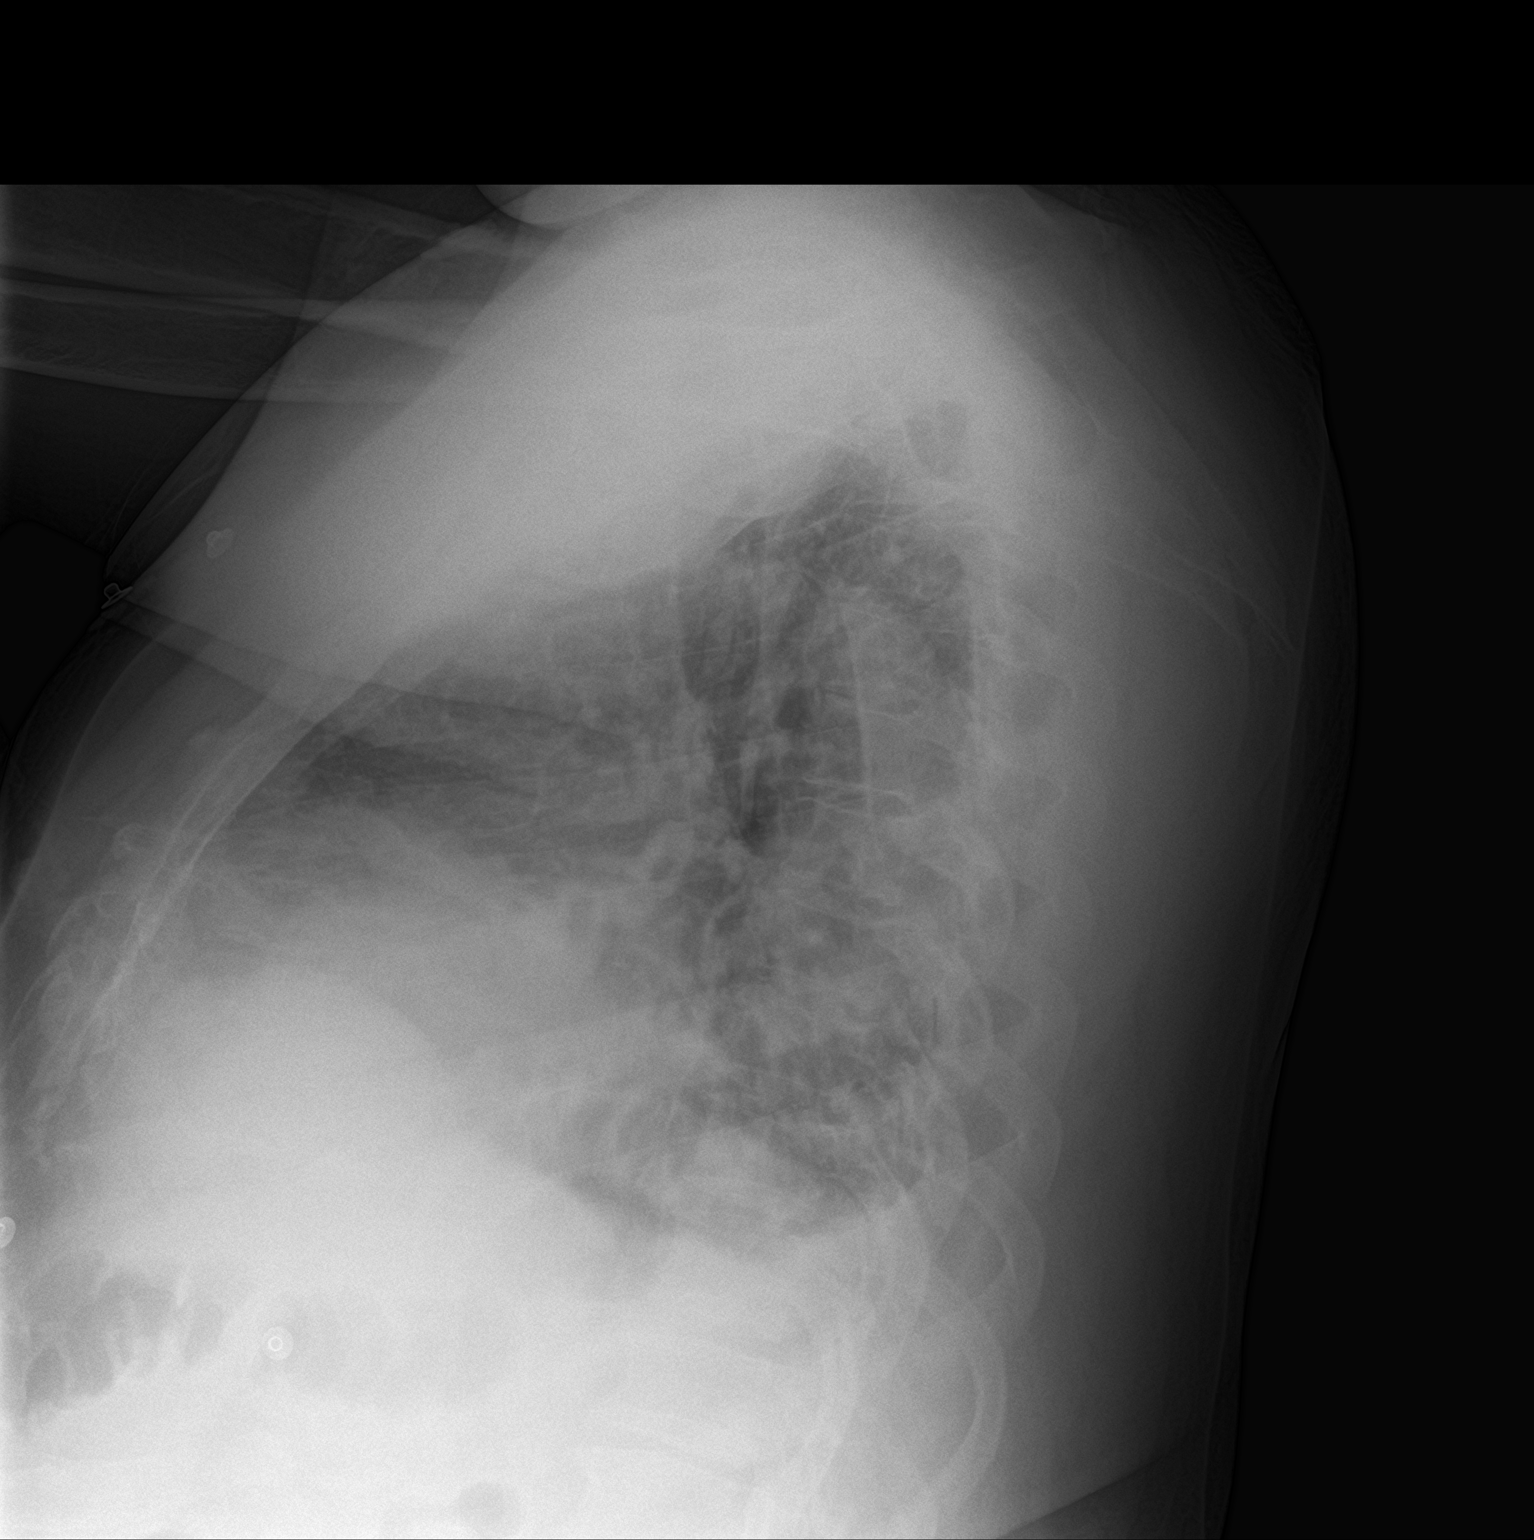

[2 of 2 positions shown; findings below may reference images not displayed]

FINDINGS: Prominent cardiomegaly again noted. Diffuse bilateral pulmonary
infiltrates/edema noted. No pleural effusion or pneumothorax. No
acute bony abnormality.
IMPRESSION: 1. Prominent cardiomegaly again noted.
2. Diffuse bilateral pulmonary infiltrates/edema.

## 2019-12-13 MED ORDER — CYCLOBENZAPRINE HCL 10 MG PO TABS
10.0000 mg | ORAL_TABLET | Freq: Three times a day (TID) | ORAL | 0 refills | Status: AC | PRN
Start: 2019-12-13 — End: 2019-12-17

## 2019-12-13 NOTE — Discharge Instructions (Signed)
Please use Tylenol (up to 4 g in a 24-hour period) for any continued pain in the neck

## 2019-12-13 NOTE — ED Notes (Signed)
Provided DC instructions. Verbalized understanding. Patient wheeled to front entrance for family to pick up.

## 2019-12-13 NOTE — ED Triage Notes (Signed)
Pt to triage via w/c with no distress noted; pt reports left sided CP radiating into left arm since yesterday with no accomp symptoms

## 2019-12-13 NOTE — ED Provider Notes (Signed)
[facility] Emergency Department Provider Note   ____________________________________________   First MD Initiated Contact with Patient 12/13/19 1108     (approximate)  I have reviewed the triage vital signs and the nursing notes.   HISTORY  Chief Complaint Chest Pain    HPI Hector [name] is a 54 y.o. male with a stated past medical history of alcoholic cardiomyopathy, CHF, CKD, and paroxysmal A. fib who presents for left neck and left upper extremity pain.  Patient states that over the last 24 hours he has had worsening left neck pain as well as redness and tenderness to palpation over the left paraspinal musculature.  Patient also now complains of left upper extremity paresthesias that have been present since last night.  Patient states that the tingling sensation in his left upper extremity has improved significantly but is still there.  Patient also describes 8/10 aching left neck pain that does not radiate.         Past Medical History:  Diagnosis Date  . Alcohol abuse   . 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 12/2007 MV: EF 28%, no isch;  b. 8/12 Echo: EF 25-35%; c. 02/2014 Echo: EF 20-25%; d. 12/2014 Cath: minimal CAD; e. 01/2015 Echo: EF 50-55%;  d. 05/2016 Echo: EF 30-35%, diff HK, gr2 DD; e. 11/2016 Echo: EF 45-50%, diff HK.  . Chronic combined systolic (congestive) and diastolic (congestive) heart failure ([place])    a. 05/2016 Most recent Echo: EF 30-35%, diff HK, Gr2 DD, mild MR, mod dil LA; b. 11/2016 Echo: EF 45-50%, diff HK.  . CKD (chronic kidney disease), stage III ([place])   . Elevated troponin    Chronically elevated. - level was 0.17-0.10 during recent admission  . Essential hypertension   . GI bleed 11/2013  . Hyperlipidemia   . Pancreatitis   . Paroxysmal A-fib ([place])    a. new onset s/p unsuccessful TEE/DCCV on 08/16/2014; b. on amio/eliquis (CHA2DS2VASc = 2-3); c. reports 1-2 hrs of afib ~ q21mos.  . Paroxysmal atrial flutter  ([place])    a. new onset 07/2014; b. s/p unsuccessful TEE/DCCV 08/16/2014; c. on apixaban.  . Sleep-disordered breathing    Has yet to have a sleep study    Patient Active Problem List   Diagnosis Date Noted  . Chronic systolic heart failure ([place]) 07/21/2016  . Obstructive sleep apnea 04/21/2016  . Nonischemic cardiomyopathy ([place]) 05/26/2015  . Chronic diastolic heart failure ([place]) 04/01/2015  . Essential hypertension 04/01/2015  . Sinus bradycardia 01/29/2015  . Paroxysmal atrial fibrillation ([place]) 08/16/2014  . Paroxysmal atrial flutter ([place]) 08/14/2014    Class: Acute  . Chronic kidney disease (CKD), stage III (moderate) ([place]) 07/02/2014  . Hypertensive heart disease   . Hyperlipidemia     Past Surgical History:  Procedure Laterality Date  . CARDIAC CATHETERIZATION N/A 01/09/2015   Procedure: Left Heart Cath and Coronary Angiography;  Surgeon: [name], MD;  Location: [place] CV LAB;  Service: Cardiovascular;  Laterality: N/A;  . ELECTROPHYSIOLOGIC STUDY N/A 08/16/2014   Procedure: CARDIOVERSION;  Surgeon: [name], MD;  Location: [facility] ORS;  Service: Cardiovascular;  Laterality: N/A;  . FLEXIBLE SIGMOIDOSCOPY N/A 10/10/2015   Procedure: FLEXIBLE SIGMOIDOSCOPY;  Surgeon: [name], MD;  Location: [facility] ENDOSCOPY;  Service: Endoscopy;  Laterality: N/A;  . KNEE SURGERY Right   . NM MYOVIEW LTD  November 2011   No ischemia or infarction. EF 50-55% ( no improvement from 2009 Myoview EF of 28%  .  TEE WITHOUT CARDIOVERSION N/A 08/16/2014   Procedure: TRANSESOPHAGEAL ECHOCARDIOGRAM (TEE);  Surgeon: [name], MD;  Location: [facility] ORS;  Service: Cardiovascular;  Laterality: N/A;    Prior to Admission medications   Medication Sig Start Date End Date Taking? Authorizing Provider  allopurinol (ZYLOPRIM) 100 MG tablet Take 100 mg by mouth daily as needed.    [provider]  amiodarone (PACERONE) 200 MG tablet Take 1 tablet (200 mg total) by mouth  daily. 01/03/18   Alisa Graff, FNP  apixaban (ELIQUIS) 5 MG TABS tablet Take 1 tablet (5 mg total) by mouth 2 (two) times daily. 12/01/18   Alisa Graff, FNP  atorvastatin (LIPITOR) 80 MG tablet Take 1 tablet (80 mg total) by mouth daily. 03/15/18   Alisa Graff, FNP  carvedilol (COREG) 3.125 MG tablet Take 1 tablet (3.125 mg total) by mouth 2 (two) times daily with a meal. 03/15/18   Alisa Graff, FNP  cyclobenzaprine (FLEXERIL) 10 MG tablet Take 1 tablet (10 mg total) by mouth 3 (three) times daily as needed for up to 4 days for muscle spasms. 12/13/19 12/17/19  Naaman Plummer, MD  furosemide (LASIX) 40 MG tablet Take 1 tablet (40 mg total) by mouth daily. 05/13/18   Rise Mu, PA-C  HYDROcodone-acetaminophen (NORCO/VICODIN) 5-325 MG tablet Take 1 tablet by mouth every 6 (six) hours as needed for moderate pain. 10/30/19   Johnn Hai, PA-C  levothyroxine (SYNTHROID, LEVOTHROID) 100 MCG tablet Take 100 mcg by mouth daily before breakfast.    [provider]  predniSONE (DELTASONE) 20 MG tablet Take 2 tablets for 5 days and then 1 tablet once a day for 5 days 10/30/19   Johnn Hai, PA-C  sacubitril-valsartan (ENTRESTO) 97-103 MG Take 1 tablet by mouth 2 (two) times daily. 03/17/19   Alisa Graff, FNP    Allergies Esomeprazole magnesium and Eggs or egg-derived products  Family History  Problem Relation Age of Onset  . Hypertension Mother   . Hyperlipidemia Mother   . Diabetes Mother     Social History Social History   Tobacco Use  . Smoking status: Former Smoker    Packs/day: 1.00    Years: 12.00    Pack years: 12.00    Quit date: 02/23/1998    Years since quitting: 21.8  . Smokeless tobacco: Never Used  Vaping Use  . Vaping Use: Never used  Substance Use Topics  . Alcohol use: No    Alcohol/week: 0.0 standard drinks    Comment: 4 heavy alcohol drinker. Quit 2 years ago.  . Drug use: No    Review of Systems Constitutional: No  fever/chills Eyes: No visual changes. ENT: No sore throat. Cardiovascular: Denies chest pain. Respiratory: Denies shortness of breath. Gastrointestinal: No abdominal pain.  No nausea, no vomiting.  No diarrhea. Genitourinary: Negative for dysuria. Musculoskeletal: Positive for left neck paraspinal musculature pain Skin: Negative for rash. Neurological: Negative for headaches, positive for paresthesia in left upper extremity Psychiatric: Negative for suicidal ideation/homicidal ideation   ____________________________________________   PHYSICAL EXAM:  VITAL SIGNS: ED Triage Vitals  Enc Vitals Group     BP 12/13/19 0624 (!) 131/93     Pulse Rate 12/13/19 0624 72     Resp 12/13/19 0624 20     Temp 12/13/19 0624 98 F (36.7 C)     Temp Source 12/13/19 0624 Oral     SpO2 12/13/19 0624 95 %     Weight 12/13/19 0623 274  lb (124.3 kg)     Height 12/13/19 0623 5\' 11"  (1.803 m)     Head Circumference --      Peak Flow --      Pain Score 12/13/19 0623 7     Pain Loc --      Pain Edu? --      Excl. in [place]? --    Constitutional: Alert and oriented. Well appearing and in no acute distress. Eyes: Conjunctivae are normal. PERRL. Head: Atraumatic. Nose: No congestion/rhinnorhea. Mouth/Throat: Mucous membranes are moist. Neck: No stridor Cardiovascular: Grossly normal heart sounds.  Good peripheral circulation. Respiratory: Normal respiratory effort.  No retractions. Gastrointestinal: Soft and nontender. No distention. Musculoskeletal: No obvious deformities Neurologic:  Normal speech and language. No gross focal neurologic deficits are appreciated. Skin:  Skin is warm and dry. No rash noted. Psychiatric: Mood and affect are normal. Speech and behavior are normal.  ____________________________________________   LABS (all labs ordered are listed, but only abnormal results are displayed)  Labs Reviewed  CBC WITH DIFFERENTIAL/PLATELET - Abnormal; Notable for the following  components:      Result Value   Platelets 148 (*)    All other components within normal limits  COMPREHENSIVE METABOLIC PANEL - Abnormal; Notable for the following components:   Glucose, Bld 141 (*)    BUN 22 (*)    Creatinine, Ser 2.45 (*)    Calcium 8.4 (*)    Total Protein 6.4 (*)    GFR, Estimated 29 (*)    All other components within normal limits  TROPONIN I (HIGH SENSITIVITY) - Abnormal; Notable for the following components:   Troponin I (High Sensitivity) 53 (*)    All other components within normal limits  TROPONIN I (HIGH SENSITIVITY) - Abnormal; Notable for the following components:   Troponin I (High Sensitivity) 38 (*)    All other components within normal limits   ____________________________________________  EKG  ED ECG REPORT I, Naaman Plummer, the attending physician, personally viewed and interpreted this ECG.  Date: 12/13/2019 EKG Time: 0624 Rate: 70 Rhythm: normal sinus rhythm QRS Axis: normal Intervals: normal ST/T Wave abnormalities: normal Narrative Interpretation: no evidence of acute ischemia  ____________________________________________  RADIOLOGY  ED MD interpretation: 2 view x-ray of the chest shows prominent cardiomegaly that is stable for him as well as bilateral pulmonary infiltrates versus edema.  No evidence of acute pneumonia, pneumothorax, or widened mediastinum  Official radiology report(s): DG Chest 2 View  Result Date: 12/13/2019 CLINICAL DATA:  Chest pain. EXAM: CHEST - 2 VIEW COMPARISON:  08/14/2014. FINDINGS: Prominent cardiomegaly again noted. Diffuse bilateral pulmonary infiltrates/edema noted. No pleural effusion or pneumothorax. No acute bony abnormality. IMPRESSION: 1. Prominent cardiomegaly again noted. 2. Diffuse bilateral pulmonary infiltrates/edema. Electronically Signed   By: Marcello Moores  Register   On: 12/13/2019 07:04    ____________________________________________   PROCEDURES  Procedure(s) performed (including  Critical Care):  Procedures   ____________________________________________   INITIAL IMPRESSION / ASSESSMENT AND PLAN / ED COURSE  As part of my medical decision making, I reviewed the following data within the [place] notes reviewed and incorporated, Labs reviewed, EKG interpreted, Old chart reviewed, Radiograph reviewed and Notes from prior ED visits reviewed and incorporated        + neck pain + sensation of weakness and numbness.  ED Workup: Defer C-Spine imaging given negative by NEXUS criteria  Given History, Exam the patient appears to have a cervical radiculopathy.   Patient appears to be low  risk for complications or other emergent conditions such as  anginal equivalent, frank cervical instability, arterial dissection, osteomyelitis, epidural abscess, central cord syndrome, c-spine fracture, other spinal emergencies  Rx: NSAIDs, outpatient physical therapy evaluation and recommendation for home exercises in the interim Disposition: Discharge. The patient has been given strict return precautions and understands the need to follow up within 48 hours with their primary care provider      ____________________________________________   FINAL CLINICAL IMPRESSION(S) / ED DIAGNOSES  Final diagnoses:  Paresthesia of left upper extremity  Neck pain on left side  Cervical radiculopathy     ED Discharge Orders         Ordered    cyclobenzaprine (FLEXERIL) 10 MG tablet  3 times daily PRN        12/13/19 1116           Note:  This document was prepared using Dragon voice recognition software and may include unintentional dictation errors.   Naaman Plummer, MD 12/13/19 1141

## 2019-12-22 ENCOUNTER — Emergency Department: Payer: Medicaid Other

## 2019-12-22 ENCOUNTER — Encounter: Payer: Self-pay | Admitting: Emergency Medicine

## 2019-12-22 ENCOUNTER — Other Ambulatory Visit: Payer: Self-pay

## 2019-12-22 ENCOUNTER — Inpatient Hospital Stay
Admission: EM | Admit: 2019-12-22 | Discharge: 2020-01-19 | DRG: 871 | Disposition: A | Discharge status: Skilled Nursing Facility | Payer: Medicaid Other | Attending: Hospitalist | Admitting: Hospitalist

## 2019-12-22 DIAGNOSIS — R531 Weakness: Secondary | ICD-10-CM | POA: Diagnosis present

## 2019-12-22 DIAGNOSIS — A4189 Other specified sepsis: Secondary | ICD-10-CM | POA: Diagnosis present

## 2019-12-22 DIAGNOSIS — R52 Pain, unspecified: Secondary | ICD-10-CM

## 2019-12-22 DIAGNOSIS — Z83438 Family history of other disorder of lipoprotein metabolism and other lipidemia: Secondary | ICD-10-CM

## 2019-12-22 DIAGNOSIS — I480 Paroxysmal atrial fibrillation: Secondary | ICD-10-CM | POA: Diagnosis present

## 2019-12-22 DIAGNOSIS — E785 Hyperlipidemia, unspecified: Secondary | ICD-10-CM | POA: Diagnosis present

## 2019-12-22 DIAGNOSIS — I119 Hypertensive heart disease without heart failure: Secondary | ICD-10-CM | POA: Diagnosis present

## 2019-12-22 DIAGNOSIS — Z833 Family history of diabetes mellitus: Secondary | ICD-10-CM

## 2019-12-22 DIAGNOSIS — Z8249 Family history of ischemic heart disease and other diseases of the circulatory system: Secondary | ICD-10-CM

## 2019-12-22 DIAGNOSIS — I130 Hypertensive heart and chronic kidney disease with heart failure and stage 1 through stage 4 chronic kidney disease, or unspecified chronic kidney disease: Secondary | ICD-10-CM | POA: Diagnosis present

## 2019-12-22 DIAGNOSIS — E039 Hypothyroidism, unspecified: Secondary | ICD-10-CM | POA: Diagnosis present

## 2019-12-22 DIAGNOSIS — E1165 Type 2 diabetes mellitus with hyperglycemia: Secondary | ICD-10-CM | POA: Diagnosis not present

## 2019-12-22 DIAGNOSIS — A419 Sepsis, unspecified organism: Secondary | ICD-10-CM

## 2019-12-22 DIAGNOSIS — D6859 Other primary thrombophilia: Secondary | ICD-10-CM | POA: Diagnosis present

## 2019-12-22 DIAGNOSIS — I4892 Unspecified atrial flutter: Secondary | ICD-10-CM | POA: Diagnosis present

## 2019-12-22 DIAGNOSIS — N1832 Chronic kidney disease, stage 3b: Secondary | ICD-10-CM | POA: Diagnosis present

## 2019-12-22 DIAGNOSIS — Z91012 Allergy to eggs: Secondary | ICD-10-CM

## 2019-12-22 DIAGNOSIS — E871 Hypo-osmolality and hyponatremia: Secondary | ICD-10-CM | POA: Diagnosis not present

## 2019-12-22 DIAGNOSIS — I428 Other cardiomyopathies: Secondary | ICD-10-CM

## 2019-12-22 DIAGNOSIS — R6520 Severe sepsis without septic shock: Secondary | ICD-10-CM | POA: Diagnosis present

## 2019-12-22 DIAGNOSIS — Z888 Allergy status to other drugs, medicaments and biological substances status: Secondary | ICD-10-CM | POA: Diagnosis not present

## 2019-12-22 DIAGNOSIS — I426 Alcoholic cardiomyopathy: Secondary | ICD-10-CM | POA: Diagnosis present

## 2019-12-22 DIAGNOSIS — R0902 Hypoxemia: Secondary | ICD-10-CM

## 2019-12-22 DIAGNOSIS — Z7901 Long term (current) use of anticoagulants: Secondary | ICD-10-CM

## 2019-12-22 DIAGNOSIS — J80 Acute respiratory distress syndrome: Secondary | ICD-10-CM | POA: Diagnosis present

## 2019-12-22 DIAGNOSIS — I5022 Chronic systolic (congestive) heart failure: Secondary | ICD-10-CM | POA: Diagnosis present

## 2019-12-22 DIAGNOSIS — G4733 Obstructive sleep apnea (adult) (pediatric): Secondary | ICD-10-CM | POA: Diagnosis present

## 2019-12-22 DIAGNOSIS — N170 Acute kidney failure with tubular necrosis: Secondary | ICD-10-CM | POA: Diagnosis not present

## 2019-12-22 DIAGNOSIS — E782 Mixed hyperlipidemia: Secondary | ICD-10-CM | POA: Diagnosis present

## 2019-12-22 DIAGNOSIS — I482 Chronic atrial fibrillation, unspecified: Secondary | ICD-10-CM | POA: Diagnosis present

## 2019-12-22 DIAGNOSIS — J9601 Acute respiratory failure with hypoxia: Secondary | ICD-10-CM

## 2019-12-22 DIAGNOSIS — I5042 Chronic combined systolic (congestive) and diastolic (congestive) heart failure: Secondary | ICD-10-CM | POA: Diagnosis present

## 2019-12-22 DIAGNOSIS — M25532 Pain in left wrist: Secondary | ICD-10-CM | POA: Diagnosis present

## 2019-12-22 DIAGNOSIS — I82409 Acute embolism and thrombosis of unspecified deep veins of unspecified lower extremity: Secondary | ICD-10-CM

## 2019-12-22 DIAGNOSIS — I5032 Chronic diastolic (congestive) heart failure: Secondary | ICD-10-CM | POA: Diagnosis present

## 2019-12-22 DIAGNOSIS — U071 COVID-19: Secondary | ICD-10-CM | POA: Diagnosis not present

## 2019-12-22 DIAGNOSIS — M109 Gout, unspecified: Secondary | ICD-10-CM | POA: Diagnosis present

## 2019-12-22 DIAGNOSIS — Z79890 Hormone replacement therapy (postmenopausal): Secondary | ICD-10-CM | POA: Diagnosis not present

## 2019-12-22 DIAGNOSIS — M79602 Pain in left arm: Secondary | ICD-10-CM

## 2019-12-22 DIAGNOSIS — Z87891 Personal history of nicotine dependence: Secondary | ICD-10-CM

## 2019-12-22 DIAGNOSIS — R0602 Shortness of breath: Secondary | ICD-10-CM

## 2019-12-22 DIAGNOSIS — N183 Chronic kidney disease, stage 3 unspecified: Secondary | ICD-10-CM | POA: Diagnosis present

## 2019-12-22 DIAGNOSIS — N179 Acute kidney failure, unspecified: Secondary | ICD-10-CM | POA: Diagnosis present

## 2019-12-22 DIAGNOSIS — T380X5A Adverse effect of glucocorticoids and synthetic analogues, initial encounter: Secondary | ICD-10-CM | POA: Diagnosis not present

## 2019-12-22 DIAGNOSIS — F1020 Alcohol dependence, uncomplicated: Secondary | ICD-10-CM | POA: Diagnosis present

## 2019-12-22 DIAGNOSIS — E1122 Type 2 diabetes mellitus with diabetic chronic kidney disease: Secondary | ICD-10-CM | POA: Diagnosis present

## 2019-12-22 DIAGNOSIS — K859 Acute pancreatitis without necrosis or infection, unspecified: Secondary | ICD-10-CM | POA: Diagnosis present

## 2019-12-22 DIAGNOSIS — I5043 Acute on chronic combined systolic (congestive) and diastolic (congestive) heart failure: Secondary | ICD-10-CM | POA: Diagnosis present

## 2019-12-22 DIAGNOSIS — Z5320 Procedure and treatment not carried out because of patient's decision for unspecified reasons: Secondary | ICD-10-CM | POA: Diagnosis present

## 2019-12-22 DIAGNOSIS — I10 Essential (primary) hypertension: Secondary | ICD-10-CM | POA: Diagnosis present

## 2019-12-22 DIAGNOSIS — M79642 Pain in left hand: Secondary | ICD-10-CM | POA: Diagnosis present

## 2019-12-22 DIAGNOSIS — J1282 Pneumonia due to coronavirus disease 2019: Secondary | ICD-10-CM | POA: Diagnosis not present

## 2019-12-22 DIAGNOSIS — E875 Hyperkalemia: Secondary | ICD-10-CM

## 2019-12-22 DIAGNOSIS — R200 Anesthesia of skin: Secondary | ICD-10-CM | POA: Diagnosis present

## 2019-12-22 DIAGNOSIS — Z79899 Other long term (current) drug therapy: Secondary | ICD-10-CM | POA: Diagnosis not present

## 2019-12-22 DIAGNOSIS — I5023 Acute on chronic systolic (congestive) heart failure: Secondary | ICD-10-CM | POA: Diagnosis present

## 2019-12-22 LAB — PROTIME-INR
INR: 1.1 (ref 0.8–1.2)
Prothrombin Time: 13.3 seconds (ref 11.4–15.2)

## 2019-12-22 LAB — URINALYSIS, COMPLETE (UACMP) WITH MICROSCOPIC
Bilirubin Urine: NEGATIVE
Glucose, UA: NEGATIVE mg/dL
Ketones, ur: NEGATIVE mg/dL
Nitrite: NEGATIVE
Protein, ur: >=300 mg/dL — AB
Specific Gravity, Urine: 1.017 (ref 1.005–1.030)
WBC, UA: >50 WBC/hpf — ABNORMAL HIGH (ref 0–5)
pH: 5 (ref 5.0–8.0)

## 2019-12-22 LAB — LACTIC ACID, PLASMA
Lactic Acid, Venous: 3 mmol/L (ref 0.5–1.9)
Lactic Acid, Venous: 2.7 mmol/L (ref 0.5–1.9)

## 2019-12-22 LAB — BLOOD GAS, ARTERIAL
Acid-base deficit: 5.3 mmol/L — ABNORMAL HIGH (ref 0.0–2.0)
Bicarbonate: 19.1 mmol/L — ABNORMAL LOW (ref 20.0–28.0)
FIO2: 0.7
O2 Saturation: 98 %
Patient temperature: 37
pCO2 arterial: 33 mmHg (ref 32.0–48.0)
pH, Arterial: 7.37 (ref 7.350–7.450)
pO2, Arterial: 107 mmHg (ref 83.0–108.0)

## 2019-12-22 LAB — COMPREHENSIVE METABOLIC PANEL
ALT: 39 U/L (ref 0–44)
AST: 88 U/L — ABNORMAL HIGH (ref 15–41)
Albumin: 3.1 g/dL — ABNORMAL LOW (ref 3.5–5.0)
Alkaline Phosphatase: 43 U/L (ref 38–126)
Anion gap: 15 (ref 5–15)
BUN: 60 mg/dL — ABNORMAL HIGH (ref 6–20)
CO2: 19 mmol/L — ABNORMAL LOW (ref 22–32)
Calcium: 8.5 mg/dL — ABNORMAL LOW (ref 8.9–10.3)
Chloride: 100 mmol/L (ref 98–111)
Creatinine, Ser: 3.4 mg/dL — ABNORMAL HIGH (ref 0.61–1.24)
GFR, Estimated: 20 mL/min — ABNORMAL LOW (ref >60)
Glucose, Bld: 118 mg/dL — ABNORMAL HIGH (ref 70–99)
Potassium: 4.6 mmol/L (ref 3.5–5.1)
Sodium: 134 mmol/L — ABNORMAL LOW (ref 135–145)
Total Bilirubin: 1.3 mg/dL — ABNORMAL HIGH (ref 0.3–1.2)
Total Protein: 7.3 g/dL (ref 6.5–8.1)

## 2019-12-22 LAB — CBC WITH DIFFERENTIAL/PLATELET
Abs Immature Granulocytes: 0.11 10*3/uL — ABNORMAL HIGH (ref 0.00–0.07)
Basophils Absolute: 0 10*3/uL (ref 0.0–0.1)
Basophils Relative: 0 %
Eosinophils Absolute: 0 10*3/uL (ref 0.0–0.5)
Eosinophils Relative: 0 %
HCT: 49.5 % (ref 39.0–52.0)
Hemoglobin: 16.4 g/dL (ref 13.0–17.0)
Immature Granulocytes: 2 %
Lymphocytes Relative: 13 %
Lymphs Abs: 0.9 10*3/uL (ref 0.7–4.0)
MCH: 28.7 pg (ref 26.0–34.0)
MCHC: 33.1 g/dL (ref 30.0–36.0)
MCV: 86.7 fL (ref 80.0–100.0)
Monocytes Absolute: 0.2 10*3/uL (ref 0.1–1.0)
Monocytes Relative: 3 %
Neutro Abs: 5.9 10*3/uL (ref 1.7–7.7)
Neutrophils Relative %: 82 %
Platelets: 223 10*3/uL (ref 150–400)
RBC: 5.71 MIL/uL (ref 4.22–5.81)
RDW: 13.7 % (ref 11.5–15.5)
Smear Review: NORMAL
WBC: 7.1 10*3/uL (ref 4.0–10.5)
nRBC: 0 % (ref 0.0–0.2)

## 2019-12-22 LAB — MRSA PCR SCREENING: MRSA by PCR: NEGATIVE

## 2019-12-22 LAB — RESPIRATORY PANEL BY RT PCR (FLU A&B, COVID)
Influenza A by PCR: NEGATIVE
Influenza B by PCR: NEGATIVE
SARS Coronavirus 2 by RT PCR: POSITIVE — AB

## 2019-12-22 LAB — FIBRIN DERIVATIVES D-DIMER (ARMC ONLY): Fibrin derivatives D-dimer (ARMC): 2897.94 ng/mL (FEU) — ABNORMAL HIGH (ref 0.00–499.00)

## 2019-12-22 LAB — PROCALCITONIN: Procalcitonin: 1.22 ng/mL

## 2019-12-22 LAB — APTT: aPTT: 31 seconds (ref 24–36)

## 2019-12-22 LAB — GLUCOSE, CAPILLARY: Glucose-Capillary: 203 mg/dL — ABNORMAL HIGH (ref 70–99)

## 2019-12-22 LAB — FERRITIN: Ferritin: 849 ng/mL — ABNORMAL HIGH (ref 24–336)

## 2019-12-22 IMAGING — DX DG CHEST 1V PORT
1 series · 1 of 1 positions shown · non-contrast
Comparison: [DATE]

CLINICAL DATA: Questionable sepsis.  Evaluate for abnormality.

EXAM:
PORTABLE CHEST 1 VIEW

[chest ap]
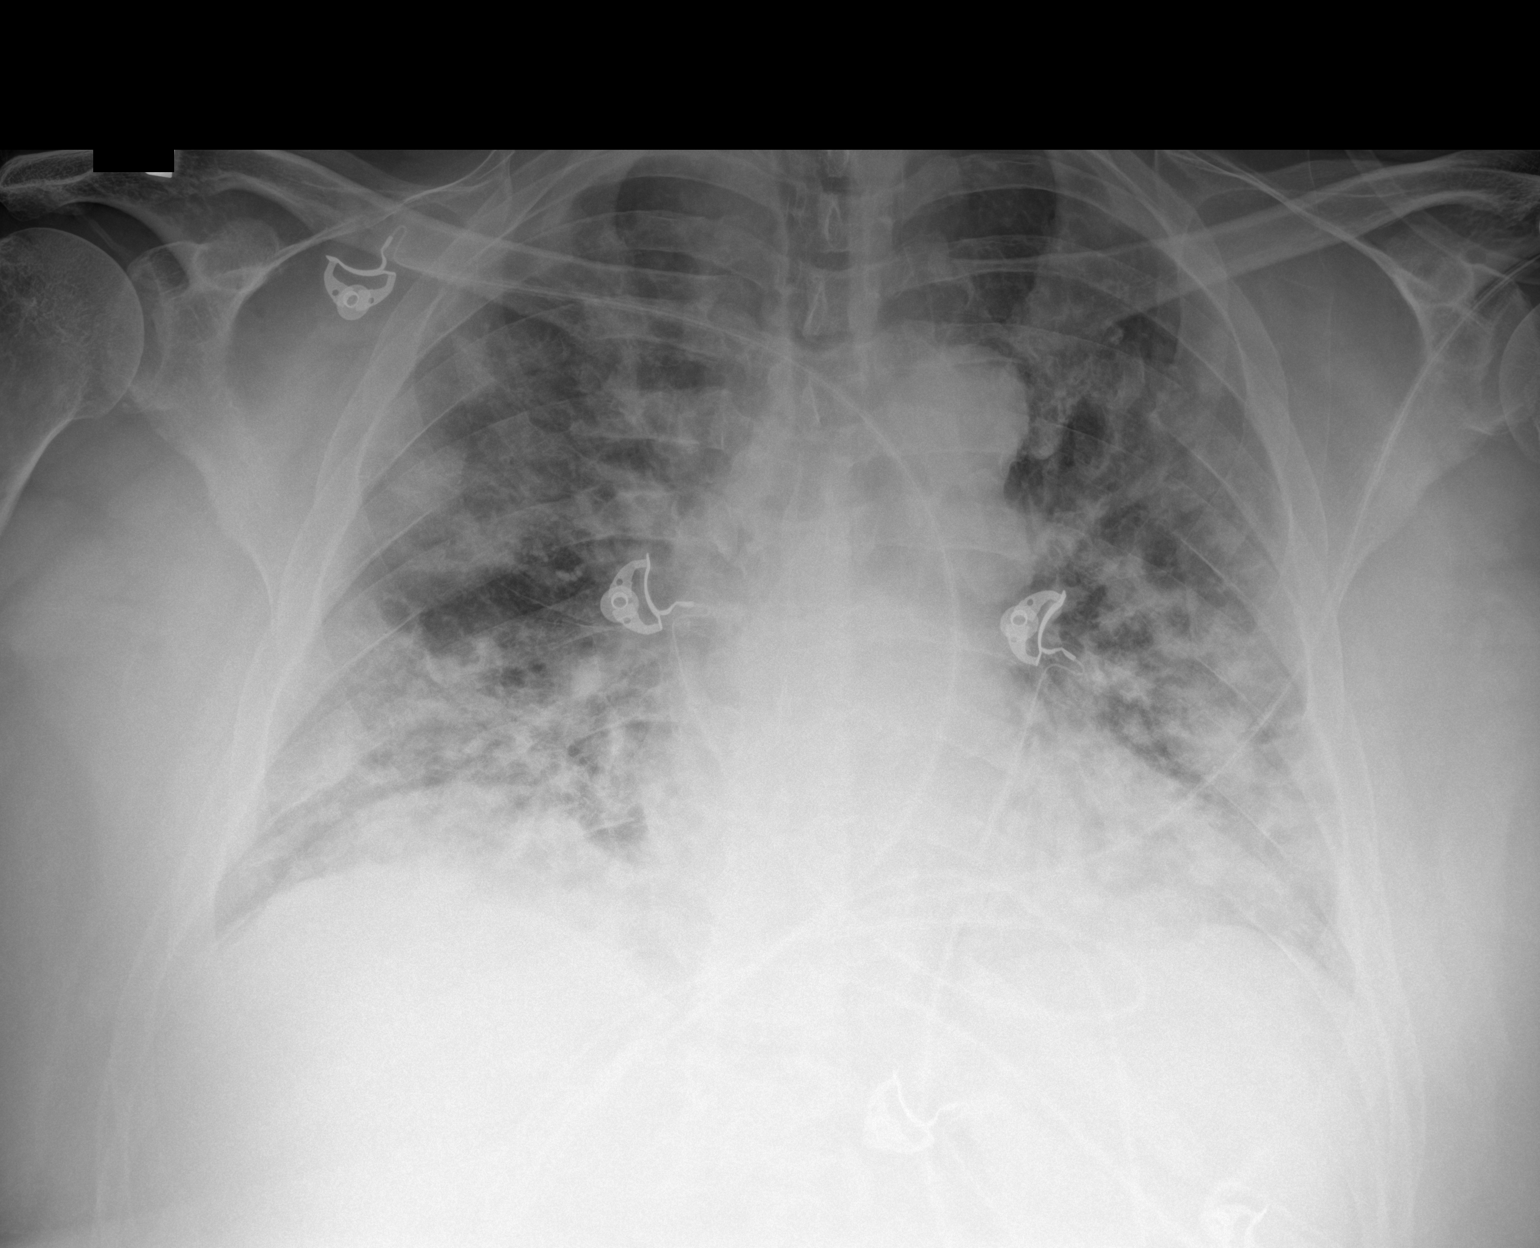

[1 of 1 positions shown; findings below may reference images not displayed]

FINDINGS: Extensive bilateral airspace opacities. No visible pleural effusions
or pneumothorax. Cardiac silhouette is partially obscured, but
likely mildly enlarged. No acute osseous abnormality.
IMPRESSION: Extensive bilateral airspace opacities, concerning for multifocal
pneumonia. Recommend correlation with COVID testing.

## 2019-12-22 MED ORDER — SODIUM CHLORIDE 0.9 % IV SOLN
200.0000 mg | Freq: Once | INTRAVENOUS | Status: AC
  Administered 2019-12-22: 200 mg via INTRAVENOUS
  Filled 2019-12-22: qty 200

## 2019-12-22 MED ORDER — METRONIDAZOLE IN NACL 5-0.79 MG/ML-% IV SOLN
500.0000 mg | Freq: Once | INTRAVENOUS | Status: AC
  Administered 2019-12-22: 500 mg via INTRAVENOUS
  Filled 2019-12-22: qty 100

## 2019-12-22 MED ORDER — SODIUM CHLORIDE 0.9 % IV SOLN
2.0000 g | Freq: Once | INTRAVENOUS | Status: AC
  Administered 2019-12-22: 2 g via INTRAVENOUS
  Filled 2019-12-22: qty 2

## 2019-12-22 MED ORDER — ALLOPURINOL 100 MG PO TABS
100.0000 mg | ORAL_TABLET | Freq: Every day | ORAL | Status: DC
  Administered 2019-12-23 – 2019-12-30 (×8): 100 mg via ORAL
  Filled 2019-12-22 (×10): qty 1

## 2019-12-22 MED ORDER — CARVEDILOL 3.125 MG PO TABS
3.1250 mg | ORAL_TABLET | Freq: Two times a day (BID) | ORAL | Status: DC
  Administered 2019-12-22 – 2020-01-19 (×56): 3.125 mg via ORAL
  Filled 2019-12-22 (×61): qty 1

## 2019-12-22 MED ORDER — DOCUSATE SODIUM 100 MG PO CAPS: 100.0000 mg | ORAL_CAPSULE | Freq: Two times a day (BID) | ORAL | Status: DC | PRN

## 2019-12-22 MED ORDER — CHLORHEXIDINE GLUCONATE CLOTH 2 % EX PADS
6.0000 | MEDICATED_PAD | Freq: Every day | CUTANEOUS | Status: DC
  Administered 2019-12-23 – 2020-01-03 (×8): 6 via TOPICAL

## 2019-12-22 MED ORDER — ATORVASTATIN CALCIUM 20 MG PO TABS
80.0000 mg | ORAL_TABLET | Freq: Every day | ORAL | Status: DC
  Administered 2019-12-22 – 2020-01-18 (×28): 80 mg via ORAL
  Filled 2019-12-22 (×2): qty 1
  Filled 2019-12-22 (×7): qty 4
  Filled 2019-12-22 (×3): qty 1
  Filled 2019-12-22 (×10): qty 4
  Filled 2019-12-22: qty 1
  Filled 2019-12-22 (×4): qty 4
  Filled 2019-12-22: qty 1

## 2019-12-22 MED ORDER — SODIUM CHLORIDE 0.9 % IV SOLN
100.0000 mg | Freq: Every day | INTRAVENOUS | Status: AC
  Administered 2019-12-23 – 2019-12-26 (×4): 100 mg via INTRAVENOUS
  Filled 2019-12-22: qty 100
  Filled 2019-12-22 (×3): qty 20

## 2019-12-22 MED ORDER — DEXAMETHASONE SODIUM PHOSPHATE 10 MG/ML IJ SOLN
10.0000 mg | Freq: Once | INTRAMUSCULAR | Status: AC
  Administered 2019-12-22: 10 mg via INTRAVENOUS
  Filled 2019-12-22: qty 1

## 2019-12-22 MED ORDER — LEVOTHYROXINE SODIUM 100 MCG PO TABS
100.0000 ug | ORAL_TABLET | Freq: Every day | ORAL | Status: DC
  Administered 2019-12-23 – 2020-01-19 (×28): 100 ug via ORAL
  Filled 2019-12-22 (×31): qty 1

## 2019-12-22 MED ORDER — HEPARIN (PORCINE) 25000 UT/250ML-% IV SOLN
1450.0000 [IU]/h | INTRAVENOUS | Status: DC
  Administered 2019-12-23 – 2019-12-24 (×3): 1450 [IU]/h via INTRAVENOUS
  Filled 2019-12-22 (×3): qty 250

## 2019-12-22 MED ORDER — DEXAMETHASONE SODIUM PHOSPHATE 10 MG/ML IJ SOLN
6.0000 mg | INTRAMUSCULAR | Status: AC
  Administered 2019-12-23 – 2020-01-01 (×10): 6 mg via INTRAVENOUS
  Filled 2019-12-22: qty 1
  Filled 2019-12-22 (×8): qty 0.6
  Filled 2019-12-22: qty 1

## 2019-12-22 MED ORDER — POLYETHYLENE GLYCOL 3350 17 G PO PACK
17.0000 g | PACK | Freq: Every day | ORAL | Status: DC | PRN
  Filled 2019-12-22: qty 1

## 2019-12-22 MED ORDER — VANCOMYCIN HCL IN DEXTROSE 1-5 GM/200ML-% IV SOLN
1000.0000 mg | Freq: Once | INTRAVENOUS | Status: DC
  Administered 2019-12-22: 1000 mg via INTRAVENOUS
  Filled 2019-12-22: qty 200

## 2019-12-22 MED ORDER — APIXABAN 5 MG PO TABS
5.0000 mg | ORAL_TABLET | Freq: Two times a day (BID) | ORAL | Status: DC
  Administered 2019-12-22: 5 mg via ORAL
  Filled 2019-12-22: qty 1

## 2019-12-22 MED ORDER — SODIUM CHLORIDE 0.9 % IV BOLUS (SEPSIS)
1000.0000 mL | Freq: Once | INTRAVENOUS | Status: AC
  Administered 2019-12-22: 1000 mL via INTRAVENOUS

## 2019-12-22 MED ORDER — AMIODARONE HCL 200 MG PO TABS
200.0000 mg | ORAL_TABLET | Freq: Every day | ORAL | Status: DC
  Administered 2019-12-22 – 2020-01-19 (×29): 200 mg via ORAL
  Filled 2019-12-22 (×30): qty 1

## 2019-12-22 MED ORDER — HYDROCODONE-ACETAMINOPHEN 5-325 MG PO TABS
1.0000 | ORAL_TABLET | Freq: Four times a day (QID) | ORAL | Status: DC | PRN
  Administered 2019-12-22 – 2020-01-04 (×7): 1 via ORAL
  Filled 2019-12-22 (×7): qty 1

## 2019-12-22 MED ORDER — SODIUM CHLORIDE 0.9 % IV SOLN
2.0000 g | INTRAVENOUS | Status: DC
  Administered 2019-12-22 – 2019-12-24 (×3): 2 g via INTRAVENOUS
  Filled 2019-12-22: qty 20
  Filled 2019-12-22 (×2): qty 2
  Filled 2019-12-22: qty 20

## 2019-12-22 MED ORDER — BARICITINIB 1 MG PO TABS
1.0000 mg | ORAL_TABLET | Freq: Every day | ORAL | Status: DC
  Administered 2019-12-23 – 2019-12-24 (×2): 1 mg via ORAL
  Filled 2019-12-22 (×4): qty 1

## 2019-12-22 NOTE — ED Notes (Signed)
Respiratory at bedside.

## 2019-12-22 NOTE — ED Triage Notes (Signed)
Pt comes into the ED via [facility] from home c/o increased weakness for the past couple days.  Pt has increased work of breathing and soaked in urine.  Pt normally is able to care for himself at home and is independent in own care.  Pt denies any CP, N/V/ dizziness.  Pt admits he has CHF but denies any DM of COPD.

## 2019-12-22 NOTE — Progress Notes (Addendum)
Patients saturations were adequate on regular HFNC but his WOB was elevated. Placed patient on [place] for the flow. [place] set at 55L and 70% and patient is tolerating well at this time.

## 2019-12-22 NOTE — ED Provider Notes (Signed)
[facility] Emergency Department Provider Note  Time seen: 8:34 AM  I have reviewed the triage vital signs and the nursing notes.   HISTORY  Chief Complaint Dyspnea, weakness  HPI Hector [name] is a 54 y.o. male with a past medical history of alcohol use, CHF, CKD, hyperlipidemia, paroxysmal atrial fibrillation, presents to the emergency department for generalized weakness and shortness of breath.  According to the patient over the past 2 weeks he has been feeling progressively more weak and short of breath.  Does have occasional cough.  Subjective fever at home.  Denies any vomiting or diarrhea.  Has noted some dysuria and darker urine.  Negative for abdominal pain or chest pain.  Patient found to be satting in the 70s on room air placed on nonrebreather and brought to the emergency department via EMS.  No baseline O2 requirement.   Past Medical History:  Diagnosis Date  . Alcohol abuse   . 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 12/2007 MV: EF 28%, no isch;  b. 8/12 Echo: EF 25-35%; c. 02/2014 Echo: EF 20-25%; d. 12/2014 Cath: minimal CAD; e. 01/2015 Echo: EF 50-55%;  d. 05/2016 Echo: EF 30-35%, diff HK, gr2 DD; e. 11/2016 Echo: EF 45-50%, diff HK.  . Chronic combined systolic (congestive) and diastolic (congestive) heart failure ([place])    a. 05/2016 Most recent Echo: EF 30-35%, diff HK, Gr2 DD, mild MR, mod dil LA; b. 11/2016 Echo: EF 45-50%, diff HK.  . CKD (chronic kidney disease), stage III ([place])   . Elevated troponin    Chronically elevated. - level was 0.17-0.10 during recent admission  . Essential hypertension   . GI bleed 11/2013  . Hyperlipidemia   . Pancreatitis   . Paroxysmal A-fib ([place])    a. new onset s/p unsuccessful TEE/DCCV on 08/16/2014; b. on amio/eliquis (CHA2DS2VASc = 2-3); c. reports 1-2 hrs of afib ~ q35mos.  . Paroxysmal atrial flutter ([place])    a. new onset 07/2014; b. s/p unsuccessful TEE/DCCV 08/16/2014; c. on apixaban.  .  Sleep-disordered breathing    Has yet to have a sleep study    Patient Active Problem List   Diagnosis Date Noted  . Chronic systolic heart failure ([place]) 07/21/2016  . Obstructive sleep apnea 04/21/2016  . Nonischemic cardiomyopathy ([place]) 05/26/2015  . Chronic diastolic heart failure ([place]) 04/01/2015  . Essential hypertension 04/01/2015  . Sinus bradycardia 01/29/2015  . Paroxysmal atrial fibrillation ([place]) 08/16/2014  . Paroxysmal atrial flutter ([place]) 08/14/2014    Class: Acute  . Chronic kidney disease (CKD), stage III (moderate) ([place]) 07/02/2014  . Hypertensive heart disease   . Hyperlipidemia     Past Surgical History:  Procedure Laterality Date  . CARDIAC CATHETERIZATION N/A 01/09/2015   Procedure: Left Heart Cath and Coronary Angiography;  Surgeon: [name], MD;  Location: [place] [facility];  Service: Cardiovascular;  Laterality: N/A;  . ELECTROPHYSIOLOGIC STUDY N/A 08/16/2014   Procedure: CARDIOVERSION;  Surgeon: [name], MD;  Location: [facility] ORS;  Service: Cardiovascular;  Laterality: N/A;  . FLEXIBLE SIGMOIDOSCOPY N/A 10/10/2015   Procedure: FLEXIBLE SIGMOIDOSCOPY;  Surgeon: [name], MD;  Location: [facility] ENDOSCOPY;  Service: Endoscopy;  Laterality: N/A;  . KNEE SURGERY Right   . NM MYOVIEW LTD  November 2011   No ischemia or infarction. EF 50-55% ( no improvement from 2009 Myoview EF of 28%  . TEE WITHOUT CARDIOVERSION N/A 08/16/2014   Procedure: TRANSESOPHAGEAL ECHOCARDIOGRAM (TEE);  Surgeon: [name], MD;  Location: [facility] ORS;  Service: Cardiovascular;  Laterality: N/A;    Prior to Admission medications   Medication Sig Start Date End Date Taking? Authorizing Provider  allopurinol (ZYLOPRIM) 100 MG tablet Take 100 mg by mouth daily as needed.    [provider]  amiodarone (PACERONE) 200 MG tablet Take 1 tablet (200 mg total) by mouth daily. 01/03/18   Alisa Graff, FNP  apixaban (ELIQUIS) 5 MG TABS tablet Take 1 tablet (5  mg total) by mouth 2 (two) times daily. 12/01/18   Alisa Graff, FNP  atorvastatin (LIPITOR) 80 MG tablet Take 1 tablet (80 mg total) by mouth daily. 03/15/18   Alisa Graff, FNP  carvedilol (COREG) 3.125 MG tablet Take 1 tablet (3.125 mg total) by mouth 2 (two) times daily with a meal. 03/15/18   Alisa Graff, FNP  furosemide (LASIX) 40 MG tablet Take 1 tablet (40 mg total) by mouth daily. 05/13/18   Rise Mu, PA-C  HYDROcodone-acetaminophen (NORCO/VICODIN) 5-325 MG tablet Take 1 tablet by mouth every 6 (six) hours as needed for moderate pain. 10/30/19   Johnn Hai, PA-C  levothyroxine (SYNTHROID, LEVOTHROID) 100 MCG tablet Take 100 mcg by mouth daily before breakfast.    [provider]  predniSONE (DELTASONE) 20 MG tablet Take 2 tablets for 5 days and then 1 tablet once a day for 5 days 10/30/19   Johnn Hai, PA-C  sacubitril-valsartan (ENTRESTO) 97-103 MG Take 1 tablet by mouth 2 (two) times daily. 03/17/19   Alisa Graff, FNP    Allergies  Allergen Reactions  . Esomeprazole Magnesium Other (See Comments)    Suspected interstitial nephritis 2018  . Eggs Or Egg-Derived Products Rash    Family History  Problem Relation Age of Onset  . Hypertension Mother   . Hyperlipidemia Mother   . Diabetes Mother     Social History Social History   Tobacco Use  . Smoking status: Former Smoker    Packs/day: 1.00    Years: 12.00    Pack years: 12.00    Quit date: 02/23/1998    Years since quitting: 21.8  . Smokeless tobacco: Never Used  Vaping Use  . Vaping Use: Never used  Substance Use Topics  . Alcohol use: No    Alcohol/week: 0.0 standard drinks    Comment: 4 heavy alcohol drinker. Quit 2 years ago.  . Drug use: No    Review of Systems Constitutional: Subjective fever Cardiovascular: Negative for chest pain. Respiratory: Positive shortness of breath.  Positive for cough. Gastrointestinal: Negative for abdominal pain, vomiting and  diarrhea. Genitourinary: Mild dysuria, dark urine Musculoskeletal: Negative for musculoskeletal complaints Neurological: Negative for headache All other ROS negative  ____________________________________________   PHYSICAL EXAM:  Constitutional: Alert and oriented. Well appearing and in no distress. Eyes: Normal exam ENT      Head: Normocephalic and atraumatic.      Mouth/Throat: Mucous membranes are moist. Cardiovascular: Normal rate, regular rhythm. Respiratory: Mild tachypnea with bilateral rhonchi largely clears with cough. Gastrointestinal: Soft and nontender. No distention.  Obese. Musculoskeletal: Nontender with normal range of motion in all extremities. No lower extremity tenderness or edema. Neurologic:  Normal speech and language. No gross focal neurologic deficits Skin:  Skin is warm, dry and intact.  Psychiatric: Mood and affect are normal  ____________________________________________    EKG  EKG viewed and interpreted by myself shows a normal sinus rhythm 86 bpm with a widened QRS, left axis deviation, largely normal intervals with  nonspecific ST findings.  ____________________________________________    RADIOLOGY  Chest x-ray shows extensive bilateral opacities.  ____________________________________________   INITIAL IMPRESSION / ASSESSMENT AND PLAN / ED COURSE  Pertinent labs & imaging results that were available during my care of the patient were reviewed by me and considered in my medical decision making (see chart for details).   Patient presents to the emergency department for generalized weakness fatigue shortness of breath found to be satting in the 70s on room air.  Patient has cough subjective fever at home.  Patient has not been vaccinated against COVID-19.  Patient does have bilateral rhonchi that largely clears after coughing.  No lower extremity edema although the patient does have significant CHF history.  We will check labs including cardiac  enzymes and blood cultures as well as a lactate.  We will start broad-spectrum antibiotics given the high likelihood of pneumonia while awaiting Covid results.  I have ordered a chest x-ray in addition the lab work.  We will attempt to titrate down oxygen as we are able.   Chest x-ray viewed and interpreted by myself looks like multifocal pneumonia, later confirmed by radiology as the same.  Lab work has resulted showing chronic kidney disease worse than baseline, normal white blood cell count lactic acid of 3.0.  Covid test is positive.  We will cancel antibiotics have ordered IV Decadron.  Patient is now on high flow nasal cannula oxygen satting in the upper 90s to 100%.  We will admit to the intensive care unit.  ARAF CLUGSTON was evaluated in Emergency Department on 12/22/2019 for the symptoms described in the history of present illness. He was evaluated in the context of the global COVID-19 pandemic, which necessitated consideration that the patient might be at risk for infection with the SARS-CoV-2 virus that causes COVID-19. Institutional protocols and algorithms that pertain to the evaluation of patients at risk for COVID-19 are in a state of rapid change based on information released by regulatory bodies including the CDC and federal and state organizations. These policies and algorithms were followed during the patient's care in the ED.  CRITICAL CARE Performed by: Harvest Dark   Total critical care time: 45 minutes  Critical care time was exclusive of separately billable procedures and treating other patients.  Critical care was necessary to treat or prevent imminent or life-threatening deterioration.  Critical care was time spent personally by me on the following activities: development of treatment plan with patient and/or surrogate as well as nursing, discussions with consultants, evaluation of patient's response to treatment, examination of patient, obtaining history from  patient or surrogate, ordering and performing treatments and interventions, ordering and review of laboratory studies, ordering and review of radiographic studies, pulse oximetry and re-evaluation of patient's condition.   ____________________________________________   FINAL CLINICAL IMPRESSION(S) / ED DIAGNOSES  COVID-19 Hypoxia   Harvest Dark, MD 12/22/19 1105

## 2019-12-22 NOTE — ED Notes (Signed)
Attempted to give report to RN ICU. Was on hold 15 minutes.

## 2019-12-22 NOTE — ED Notes (Signed)
Date and time results received: 12/22/19 09:52  Test: lactic acid Critical Value: 3.0  Name of Provider Notified: Paduchowski  Orders Received? Or Actions Taken?: Actions Taken: Abx already started for the patient.

## 2019-12-22 NOTE — Progress Notes (Signed)
PHARMACY -  BRIEF ANTIBIOTIC NOTE   Pharmacy has received consult(s) for vancomycin and cefepime from an ED provider.  The patient's profile has been reviewed for ht/wt/allergies/indication/available labs.    One time order(s) placed for vancomycin 1 g + cefepime 2 g  Further antibiotics/pharmacy consults should be ordered by admitting physician if indicated.                       Thank you,  Tawnya Crook, PharmD 12/22/2019  8:42 AM

## 2019-12-22 NOTE — Consult Note (Addendum)
ANTICOAGULATION CONSULT NOTE   Pharmacy Consult for Heparin Infusion Indication: atrial fibrillation  Patient Measurements: Height: 5\' 11"  (180.3 cm) Weight: 129.7 kg (286 lb) IBW/kg (Calculated) : 75.3 Heparin Dosing Weight: 104.8 kg  Labs: Recent Labs    12/22/19 0849  HGB 16.4  HCT 49.5  PLT 223  APTT 31  LABPROT 13.3  INR 1.1  CREATININE 3.40*    Estimated Creatinine Clearance: 33.7 mL/min (A) (by C-G formula based on SCr of 3.4 mg/dL (H)).   Medical History: Past Medical History:  Diagnosis Date  . Alcohol abuse   . 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 12/2007 MV: EF 28%, no isch;  b. 8/12 Echo: EF 25-35%; c. 02/2014 Echo: EF 20-25%; d. 12/2014 Cath: minimal CAD; e. 01/2015 Echo: EF 50-55%;  d. 05/2016 Echo: EF 30-35%, diff HK, gr2 DD; e. 11/2016 Echo: EF 45-50%, diff HK.  . Chronic combined systolic (congestive) and diastolic (congestive) heart failure ([place])    a. 05/2016 Most recent Echo: EF 30-35%, diff HK, Gr2 DD, mild MR, mod dil LA; b. 11/2016 Echo: EF 45-50%, diff HK.  . CKD (chronic kidney disease), stage III ([place])   . Elevated troponin    Chronically elevated. - level was 0.17-0.10 during recent admission  . Essential hypertension   . GI bleed 11/2013  . Hyperlipidemia   . Pancreatitis   . Paroxysmal A-fib ([place])    a. new onset s/p unsuccessful TEE/DCCV on 08/16/2014; b. on amio/eliquis (CHA2DS2VASc = 2-3); c. reports 1-2 hrs of afib ~ q27mos.  . Paroxysmal atrial flutter ([place])    a. new onset 07/2014; b. s/p unsuccessful TEE/DCCV 08/16/2014; c. on apixaban.  . Sleep-disordered breathing    Has yet to have a sleep study    Medications:  Apixaban 5 mg BID for atrial fibrillation (last dose 10/29 at 1839)  Assessment: Patient is a 54 y/o M with medical history including atrial fibrillation on apixaban, CHF, CKD, alcohol abuse who is admitted with severe COVID-19 pneumonia with ARDS. Admission further complicated by AKI. Pharmacy has been consulted to  initiate heparin infusion in this patient due to concerns about clearance of apixaban given renal dysfunction.   Per chart, patient is currently in NSR. Baseline aPTT 31, INR 1.1, anti-Xa ordered for tomorrow AM. H&H, platelets within normal limits. D-dimer pending.   Goal of Therapy:  Heparin level 0.3-0.7 units/ml aPTT 66 - 102 seconds Monitor platelets by anticoagulation protocol: Yes   Plan:  --Will initiate heparin infusion at 1450 units/hr, no bolus starting 10/30 at 0800 --Will order aPTT for 8 hours after initiation of infusion (10/30 at 1600). Continue to follow aPTT given apixaban interference with anti-Xa level. Can switch over to anti-Xa level when correlation is established --Daily CBC per protocol  [name] 12/22/2019,8:47 PM

## 2019-12-22 NOTE — Progress Notes (Signed)
CODE SEPSIS - PHARMACY COMMUNICATION  **Broad Spectrum Antibiotics should be administered within 1 hour of Sepsis diagnosis**  Time Code Sepsis Called/Page Received: 4835  Antibiotics Ordered: vancomycin/cefepime/metronidazole  Time of 1st antibiotic administration: 0926  Additional action taken by pharmacy: NA  If necessary, Name of Provider/Nurse Contacted: NA    Tawnya Crook ,PharmD Clinical Pharmacist  12/22/2019  9:47 AM

## 2019-12-22 NOTE — Progress Notes (Signed)
eLink Physician-Brief Progress Note Patient Name: Hector [name] DOB: 07-Jun-1965 MRN: [id number]   Date of Service  12/22/2019  HPI/Events of Note  Patient is a 54 yo male with multiple co-morbidities including CHF, CKD, PAF, ETOH abuse admitted with acute hypoxemic respiratory failure secondary to Covid 19 pneumonia.  eICU Interventions  New Patient Evaluation completed.        Frederik Pear 12/22/2019, 9:33 PM

## 2019-12-22 NOTE — ED Notes (Signed)
Pt incontinent of urine and stool. Pt's linens and gown changed. External catheter placed at this time. Pt reposition in bed. No further complaints at this time.

## 2019-12-22 NOTE — H&P (Signed)
Name: Hector [name] MRN: [id number] DOB: 05/10/1965     CONSULTATION DATE: 12/22/2019  REFERRING MD : admitted thru ER CHIEF COMPLAINT:  Acute resp failure/covid 19    HISTORY OF PRESENT ILLNESS:  [name] is a 54 year old male with past medical history of congestive heart failure chronic kidney disease hyperlipidemia paroxysmal atrial fibrillation and alcohol use who presented to the emergency room today with 2 to 3 weeks history of shortness of breath associated with dry cough and generalized weakness.  He also complained of subjective fever at home but none documented.  He denied any nausea vomiting or diarrhea.  He has had some dysuria and darker urine.  He denied any abdominal pain or chest pain.  In the ER his SPO2 on room air was in the 70s and he was placed on a nonrebreather and transition to high flow nasal cannula.  He does not use his oxygen at home.  He denies any hemoptysis headaches seizures loss of consciousness.  No joint pain or rashes.  He feels chilly.   PAST MEDICAL HISTORY :   has a past medical history of Alcohol abuse, Alcoholic cardiomyopathy ([place]) (2009), Chronic combined systolic (congestive) and diastolic (congestive) heart failure ([place]), CKD (chronic kidney disease), stage III ([place]), Elevated troponin, Essential hypertension, GI bleed (11/2013), Hyperlipidemia, Pancreatitis, Paroxysmal A-fib ([place]), Paroxysmal atrial flutter ([place]), and Sleep-disordered breathing.  has a past surgical history that includes Knee surgery (Right); NM MYOVIEW LTD (November 2011); TEE without cardioversion (N/A, 08/16/2014); Cardiac catheterization (N/A, 08/16/2014); Cardiac catheterization (N/A, 01/09/2015); and Flexible sigmoidoscopy (N/A, 10/10/2015). Prior to Admission medications   Medication Sig Start Date End Date Taking? Authorizing Provider  amiodarone (PACERONE) 200 MG tablet Take 1 tablet (200 mg total) by mouth daily. 01/03/18  Yes Darylene Price A, FNP   apixaban (ELIQUIS) 5 MG TABS tablet Take 1 tablet (5 mg total) by mouth 2 (two) times daily. 12/01/18  Yes Hackney, Otila Kluver A, FNP  atorvastatin (LIPITOR) 80 MG tablet Take 1 tablet (80 mg total) by mouth daily. 03/15/18  Yes Darylene Price A, FNP  carvedilol (COREG) 3.125 MG tablet Take 1 tablet (3.125 mg total) by mouth 2 (two) times daily with a meal. 03/15/18  Yes Darylene Price A, FNP  furosemide (LASIX) 40 MG tablet Take 1 tablet (40 mg total) by mouth daily. 05/13/18  Yes Dunn, Areta Haber, PA-C  HYDROcodone-acetaminophen (NORCO/VICODIN) 5-325 MG tablet Take 1 tablet by mouth every 6 (six) hours as needed for moderate pain. 10/30/19  Yes Letitia Neri L, PA-C  levothyroxine (SYNTHROID, LEVOTHROID) 100 MCG tablet Take 100 mcg by mouth daily before breakfast.   Yes [provider]  sacubitril-valsartan (ENTRESTO) 97-103 MG Take 1 tablet by mouth 2 (two) times daily. 03/17/19  Yes Hackney, Otila Kluver A, FNP  allopurinol (ZYLOPRIM) 100 MG tablet Take 100 mg by mouth daily as needed.    [provider]  predniSONE (DELTASONE) 20 MG tablet Take 2 tablets for 5 days and then 1 tablet once a day for 5 days Patient not taking: Reported on 12/22/2019 10/30/19   Johnn Hai, PA-C   Allergies  Allergen Reactions  . Esomeprazole Magnesium Other (See Comments)    Suspected interstitial nephritis 2018  . Eggs Or Egg-Derived Products Rash    FAMILY HISTORY:  family history includes Diabetes in his mother; Hyperlipidemia in his mother; Hypertension in his mother. SOCIAL HISTORY:  reports that he quit smoking about 21 years ago. He has a 12.00 pack-year smoking history. He has  never used smokeless tobacco. He reports that he does not drink alcohol and does not use drugs.  Last etoh use few yrs ago  REVIEW OF SYSTEMS:   Constitutional: Subjective fever Cardiovascular: Negative for chest pain. Respiratory: Positive shortness of breath.  Positive for cough. Gastrointestinal: Negative for abdominal  pain, vomiting and diarrhea. Genitourinary: Mild dysuria, dark urine Musculoskeletal: Negative for musculoskeletal complaints Neurological: Negative for headache All other ROS negative  Estimated body mass index is 39.89 kg/m as calculated from the following:   Height as of this encounter: 5\' 11"  (1.803 m).   Weight as of this encounter: 129.7 kg.    VITAL SIGNS: Temp:  [98.6 F (37 C)] 98.6 F (37 C) (10/29 0845) Pulse Rate:  [65-98] 70 (10/29 1800) Resp:  [28-39] 39 (10/29 1800) BP: (102-141)/(81-108) 130/100 (10/29 1800) SpO2:  [94 %-100 %] 99 % (10/29 1800) FiO2 (%):  [70 %] 70 % (10/29 1245) Weight:  [129.7 kg] 129.7 kg (10/29 0846)   No intake/output data recorded. No intake/output data recorded.   SpO2: 99 % O2 Flow Rate (L/min): 55 L/min FiO2 (%): 70 %   Physical Examination:  GENERAL:critically ill appearing, +resp distress HEAD: Normocephalic, atraumatic.  EYES: Pupils equal, round, reactive to light.  No scleral icterus.  MOUTH: Moist mucosal membrane. NECK: Supple. No JVD.  PULMONARY: Bibasilar crackles.  No rhonchi or wheezing. CARDIOVASCULAR: S1 and S2. Regular rate and rhythm. No murmurs, rubs, or gallops.  GASTROINTESTINAL: Soft, nontender, -distended.  Positive bowel sounds.  MUSCULOSKELETAL: No swelling, clubbing, or edema.  NEUROLOGIC: Awake and alert and appropriate.  No cranial nerve deficits. SKIN:intact,warm,dry  I personally reviewed lab work that was obtained in last 24 hrs. CXR Independently reviewed wgich shows bilateral diffuse infilterates   MEDICATIONS: I have reviewed all medications and confirmed regimen as documented   CULTURE RESULTS   Recent Results (from the past 240 hour(s))  Respiratory Panel by RT PCR (Flu A&B, Covid) - Nasopharyngeal Swab     Status: Abnormal   Collection Time: 12/22/19  8:49 AM   Specimen: Nasopharyngeal Swab  Result Value Ref Range Status   SARS Coronavirus 2 by RT PCR POSITIVE (A) NEGATIVE Final     Comment: RESULT CALLED TO, READ BACK BY AND VERIFIED WITH: KENDALL FLANNIGAN AT 1610 ON 12/22/2019 [place]. (NOTE) SARS-CoV-2 target nucleic acids are DETECTED.  SARS-CoV-2 RNA is generally detectable in upper respiratory specimens  during the acute phase of infection. Positive results are indicative of the presence of the identified virus, but do not rule out bacterial infection or co-infection with other pathogens not detected by the test. Clinical correlation with patient history and other diagnostic information is necessary to determine patient infection status. The expected result is Negative.  Fact Sheet for Patients:  PinkCheek.be  Fact Sheet for Healthcare Providers: GravelBags.it  This test is not yet approved or cleared by the [place] FDA and  has been authorized for detection and/or diagnosis of SARS-CoV-2 by FDA under an Emergency Use Authorization (EUA).  This EUA will remain in effect (meaning this te st can be used) for the duration of  the COVID-19 declaration under Section 564(b)(1) of the Act, 21 U.S.C. section 360bbb-3(b)(1), unless the authorization is terminated or revoked sooner.      Influenza A by PCR NEGATIVE NEGATIVE Final   Influenza B by PCR NEGATIVE NEGATIVE Final    Comment: (NOTE) The Xpert Xpress SARS-CoV-2/FLU/RSV assay is intended as an aid in  the diagnosis of influenza from Nasopharyngeal swab  specimens and  should not be used as a sole basis for treatment. Nasal washings and  aspirates are unacceptable for Xpert Xpress SARS-CoV-2/FLU/RSV  testing.  Fact Sheet for Patients: PinkCheek.be  Fact Sheet for Healthcare Providers: GravelBags.it  This test is not yet approved or cleared by the [place] FDA and  has been authorized for detection and/or diagnosis of SARS-CoV-2 by  FDA under an Emergency Use Authorization  (EUA). This EUA will remain  in effect (meaning this test can be used) for the duration of the  Covid-19 declaration under Section 564(b)(1) of the Act, 21  U.S.C. section 360bbb-3(b)(1), unless the authorization is  terminated or revoked. Performed at [facility], [address] [place], [place] [address]           IMAGING    DG Chest Port 1 View  Result Date: 2019/12/24 CLINICAL DATA:  Questionable sepsis.  Evaluate for abnormality. EXAM: PORTABLE CHEST 1 VIEW COMPARISON:  12/13/2019 FINDINGS: Extensive bilateral airspace opacities. No visible pleural effusions or pneumothorax. Cardiac silhouette is partially obscured, but likely mildly enlarged. No acute osseous abnormality. IMPRESSION: Extensive bilateral airspace opacities, concerning for multifocal pneumonia. Recommend correlation with COVID testing. Electronically Signed   By: Margaretha Sheffield MD   On: 12-24-2019 09:06          ASSESSMENT AND PLAN SYNOPSIS  1.Severe covid 19 pneumonia with ARDS  rx with steroids and baricitinib( dose adjusted High risk for further progressions  2. Acute hypoxic resp failure  Cont HFNC , pt on 55L flow with 70% fio2 with spo2 in mid 90,s ABG 7.37/33/107   3. HTN cont carvidolol  4. Non ischemic CMP stable , cont carvidolol, BNP elevated likely related to CKD/chf EF 40- 45% 11/2016  5. CKD bun/cr 60/3.40  Follow, lasix held  6. Chronic afib, cont apixaban    Critical Care Time devoted to patient care services described in this note is 55  minutes.   Overall, patient is critically ill, prognosis is guarded.  Patient with Multiorgan failure and at high risk for cardiac arrest and death   Rosine Door, MD Oct 31, 20216:46 PM

## 2019-12-23 ENCOUNTER — Inpatient Hospital Stay: Payer: Medicaid Other

## 2019-12-23 DIAGNOSIS — J1282 Pneumonia due to coronavirus disease 2019: Secondary | ICD-10-CM | POA: Diagnosis not present

## 2019-12-23 DIAGNOSIS — U071 COVID-19: Secondary | ICD-10-CM | POA: Diagnosis not present

## 2019-12-23 LAB — CBC WITH DIFFERENTIAL/PLATELET
Abs Immature Granulocytes: 0.1 10*3/uL — ABNORMAL HIGH (ref 0.00–0.07)
Basophils Absolute: 0 10*3/uL (ref 0.0–0.1)
Basophils Relative: 0 %
Eosinophils Absolute: 0 10*3/uL (ref 0.0–0.5)
Eosinophils Relative: 0 %
HCT: 43.9 % (ref 39.0–52.0)
Hemoglobin: 14.8 g/dL (ref 13.0–17.0)
Immature Granulocytes: 2 %
Lymphocytes Relative: 15 %
Lymphs Abs: 0.7 10*3/uL (ref 0.7–4.0)
MCH: 29.1 pg (ref 26.0–34.0)
MCHC: 33.7 g/dL (ref 30.0–36.0)
MCV: 86.2 fL (ref 80.0–100.0)
Monocytes Absolute: 0.2 10*3/uL (ref 0.1–1.0)
Monocytes Relative: 4 %
Neutro Abs: 3.5 10*3/uL (ref 1.7–7.7)
Neutrophils Relative %: 79 %
Platelets: 210 10*3/uL (ref 150–400)
RBC: 5.09 MIL/uL (ref 4.22–5.81)
RDW: 13.5 % (ref 11.5–15.5)
WBC: 4.5 10*3/uL (ref 4.0–10.5)
nRBC: 0 % (ref 0.0–0.2)

## 2019-12-23 LAB — C-REACTIVE PROTEIN
CRP: 12.9 mg/dL — ABNORMAL HIGH (ref <1.0)
CRP: 13.2 mg/dL — ABNORMAL HIGH (ref <1.0)

## 2019-12-23 LAB — COMPREHENSIVE METABOLIC PANEL
ALT: 39 U/L (ref 0–44)
AST: 61 U/L — ABNORMAL HIGH (ref 15–41)
Albumin: 2.6 g/dL — ABNORMAL LOW (ref 3.5–5.0)
Alkaline Phosphatase: 39 U/L (ref 38–126)
Anion gap: 14 (ref 5–15)
BUN: 71 mg/dL — ABNORMAL HIGH (ref 6–20)
CO2: 19 mmol/L — ABNORMAL LOW (ref 22–32)
Calcium: 8 mg/dL — ABNORMAL LOW (ref 8.9–10.3)
Chloride: 98 mmol/L (ref 98–111)
Creatinine, Ser: 3.59 mg/dL — ABNORMAL HIGH (ref 0.61–1.24)
GFR, Estimated: 19 mL/min — ABNORMAL LOW (ref >60)
Glucose, Bld: 205 mg/dL — ABNORMAL HIGH (ref 70–99)
Potassium: 5.4 mmol/L — ABNORMAL HIGH (ref 3.5–5.1)
Sodium: 131 mmol/L — ABNORMAL LOW (ref 135–145)
Total Bilirubin: 1 mg/dL (ref 0.3–1.2)
Total Protein: 6.6 g/dL (ref 6.5–8.1)

## 2019-12-23 LAB — FIBRIN DERIVATIVES D-DIMER (ARMC ONLY): Fibrin derivatives D-dimer (ARMC): 2752.3 ng/mL (FEU) — ABNORMAL HIGH (ref 0.00–499.00)

## 2019-12-23 LAB — URINE CULTURE: Culture: NO GROWTH

## 2019-12-23 LAB — POTASSIUM: Potassium: 4.8 mmol/L (ref 3.5–5.1)

## 2019-12-23 LAB — HEMOGLOBIN A1C
Hgb A1c MFr Bld: 7.7 % — ABNORMAL HIGH (ref 4.8–5.6)
Mean Plasma Glucose: 174.29 mg/dL

## 2019-12-23 LAB — GLUCOSE, CAPILLARY
Glucose-Capillary: 236 mg/dL — ABNORMAL HIGH (ref 70–99)
Glucose-Capillary: 220 mg/dL — ABNORMAL HIGH (ref 70–99)
Glucose-Capillary: 264 mg/dL — ABNORMAL HIGH (ref 70–99)
Glucose-Capillary: 258 mg/dL — ABNORMAL HIGH (ref 70–99)

## 2019-12-23 LAB — APTT: aPTT: 81 seconds — ABNORMAL HIGH (ref 24–36)

## 2019-12-23 LAB — FERRITIN: Ferritin: 1063 ng/mL — ABNORMAL HIGH (ref 24–336)

## 2019-12-23 LAB — PROCALCITONIN: Procalcitonin: 1.16 ng/mL

## 2019-12-23 LAB — PHOSPHORUS: Phosphorus: 4 mg/dL (ref 2.5–4.6)

## 2019-12-23 LAB — HIV ANTIBODY (ROUTINE TESTING W REFLEX): HIV Screen 4th Generation wRfx: NONREACTIVE

## 2019-12-23 LAB — MAGNESIUM: Magnesium: 2.4 mg/dL (ref 1.7–2.4)

## 2019-12-23 LAB — HEPARIN LEVEL (UNFRACTIONATED): Heparin Unfractionated: 1.77 IU/mL — ABNORMAL HIGH (ref 0.30–0.70)

## 2019-12-23 IMAGING — CT CT HEAD W/O CM
3 of 4 series · 16 of 47 positions shown, 19 images · non-contrast
Comparison: CT maxillofacial dated [DATE].

CLINICAL DATA: Neurologic deficit.

EXAM:
CT HEAD WITHOUT CONTRAST
TECHNIQUE: Contiguous axial images were obtained from the base of the skull
through the vertex without intravenous contrast.

[Series 2: head wo · axial · 0.49mm/px · z∈[-197,-42]mm · 10 of 37 slices shown, 13 images]
[im 3/37  brain]
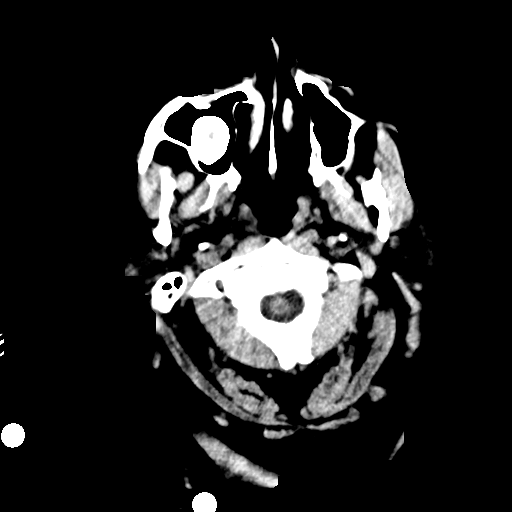
[im 3/37  bone]
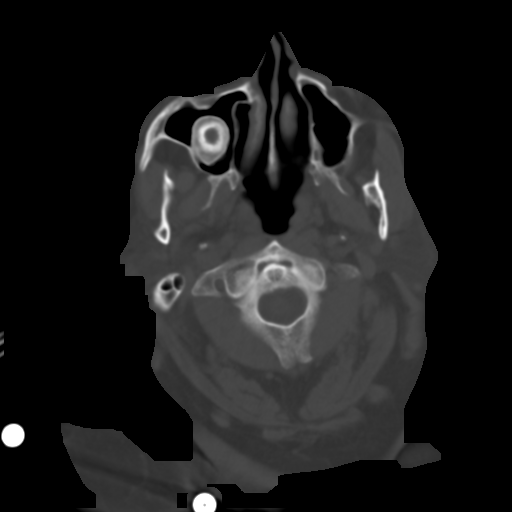
[im 6/37  brain]
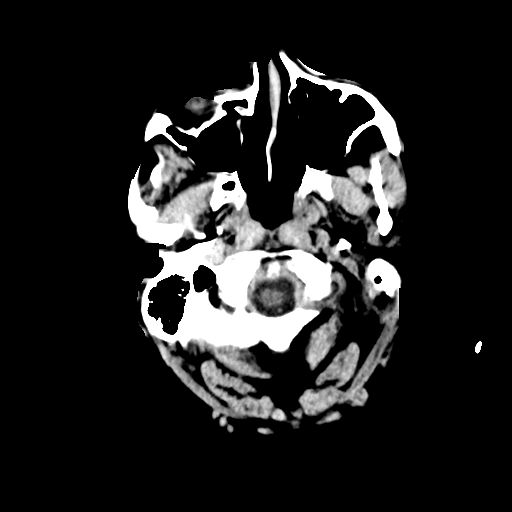
[im 11/37  brain]
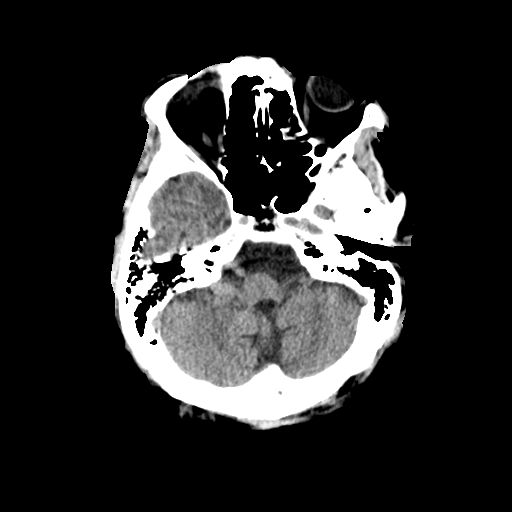
[im 13/37  brain]
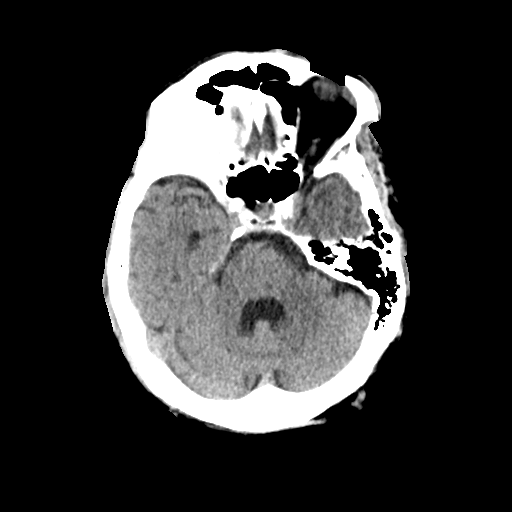
[im 16/37  brain]
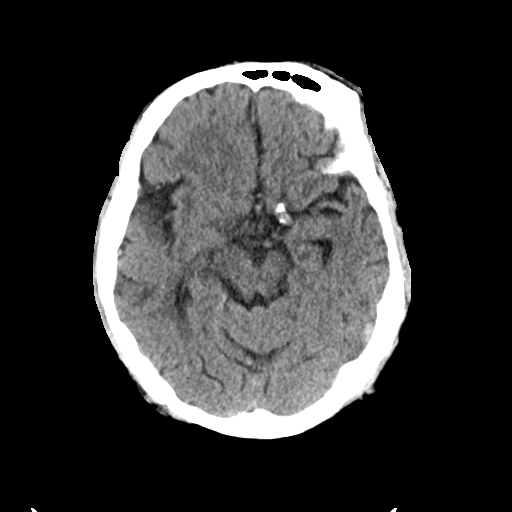
[im 16/37  bone]
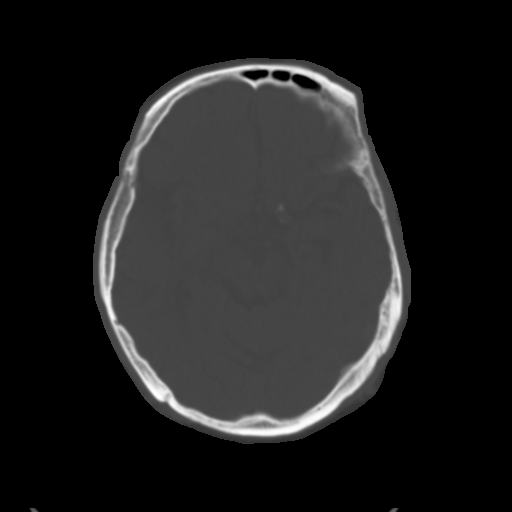
[im 21/37  brain]
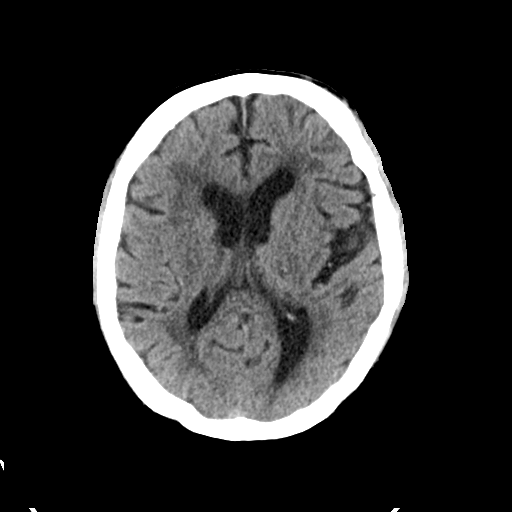
[im 24/37  brain]
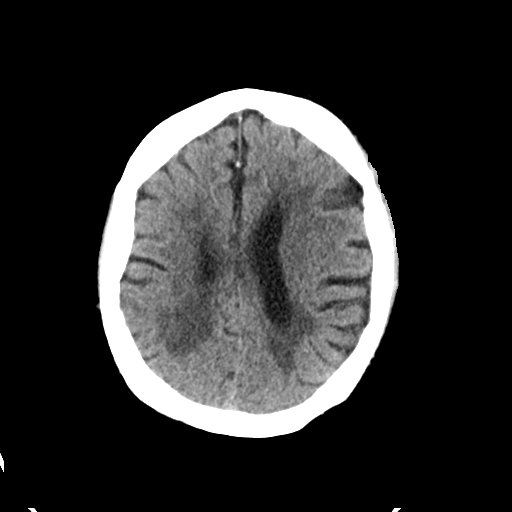
[im 26/37  brain]
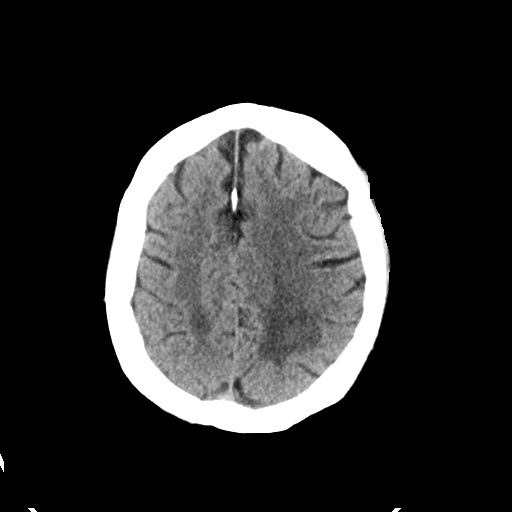
[im 31/37  brain]
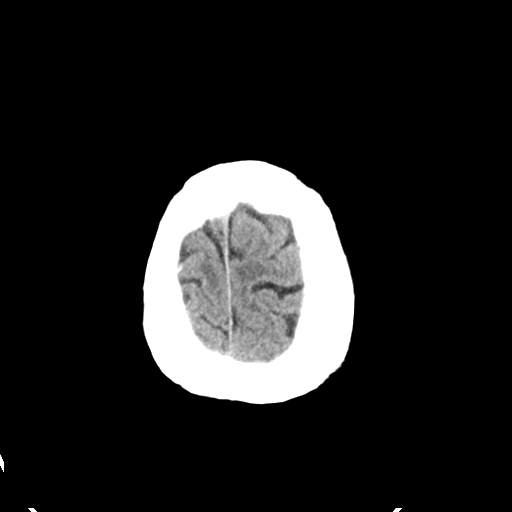
[im 31/37  bone]
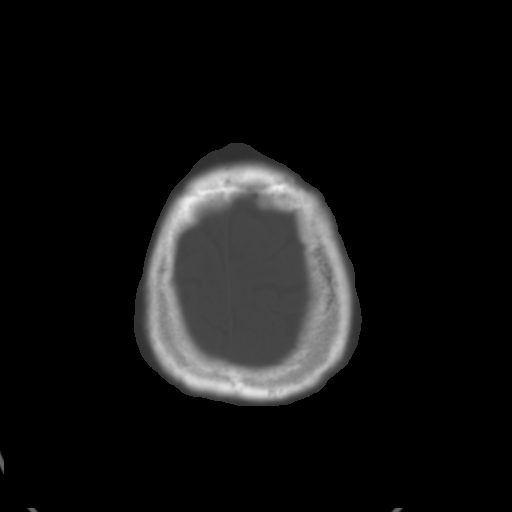
[im 34/37  brain]
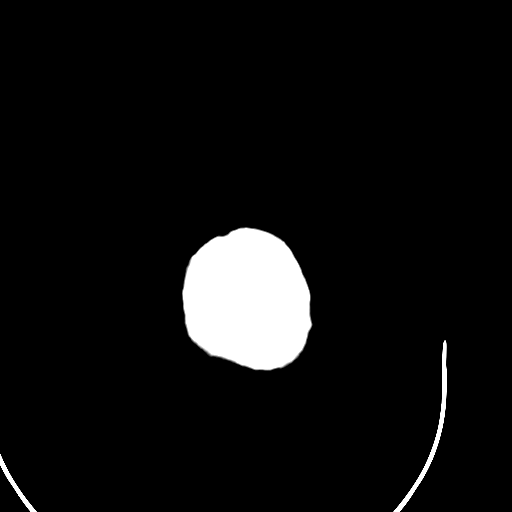

[Series 4: coronal soft tissue · coronal · 0.37mm/px · 3 of 79 slices shown]
[im 27/79  brain]
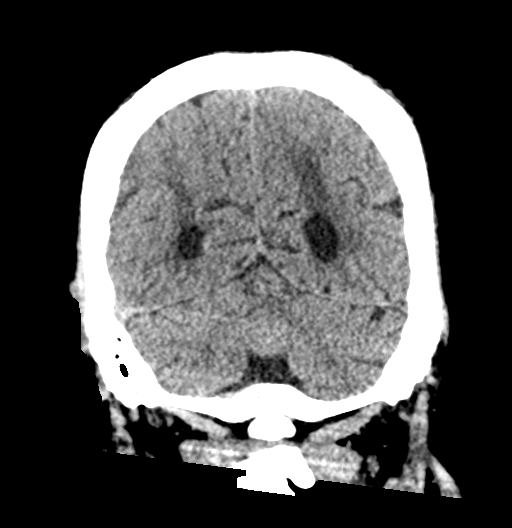
[im 35/79  brain]
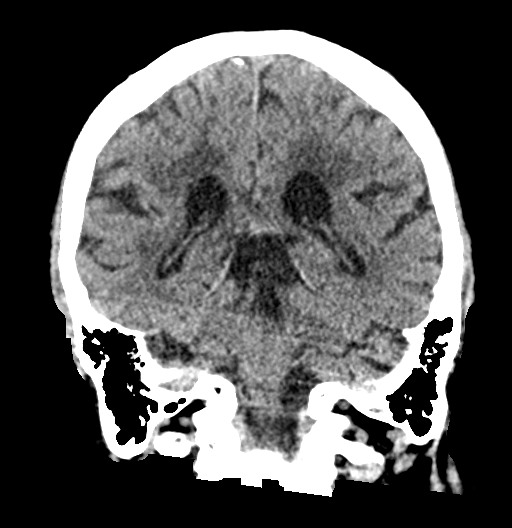
[im 44/79  brain]
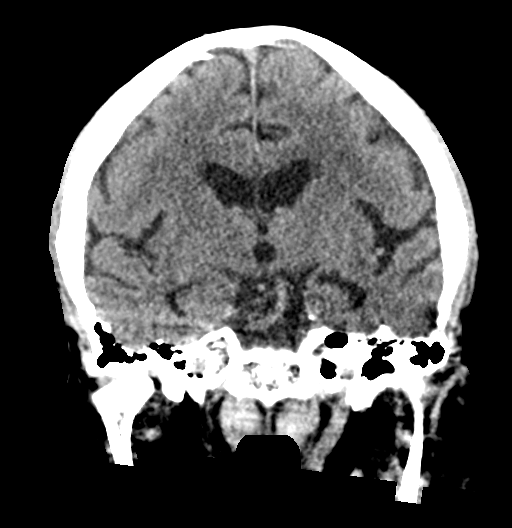

[Series 5: sagittal soft tissue · sagittal · 0.35mm/px · 3 of 63 slices shown]
[im 26/63  brain]
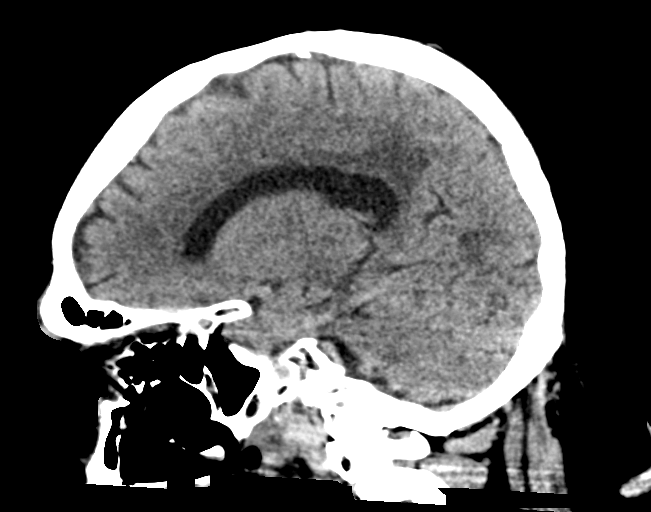
[im 32/63  brain]
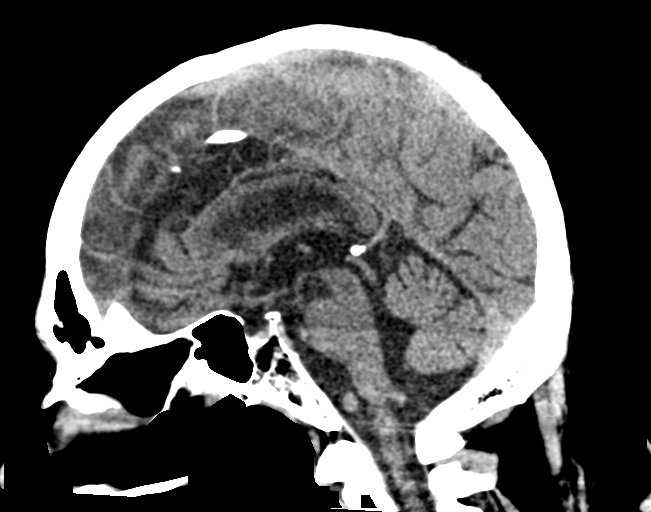
[im 37/63  brain]
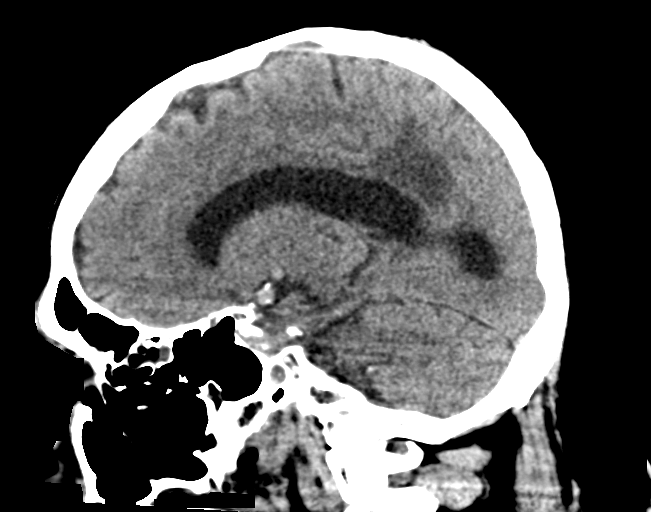

[16 of 47 positions shown; findings below may reference images not displayed]

FINDINGS: Brain: No evidence of acute infarction, hemorrhage, hydrocephalus,
extra-axial collection or mass lesion/mass effect. There is mild
cerebral volume loss with associated ex vacuo dilatation.
Periventricular white matter hypoattenuation likely represents
chronic small vessel ischemic disease.

Vascular: There are vascular calcifications in the carotid siphons.

Skull: Chronic, healed right facial bone fractures are partially
imaged. Negative for acute fracture or focal lesion.

Sinuses/Orbits: Calcified, inspissated mucus in the right maxillary
sinus is redemonstrated.

Other: None.
IMPRESSION: No acute intracranial process.

## 2019-12-23 IMAGING — US US RENAL
1 series · 14 of 25 positions shown · non-contrast
Comparison: None.

CLINICAL DATA: Acute renal failure.

EXAM:
RENAL / URINARY TRACT ULTRASOUND COMPLETE

[Series 1: us renal · 14 of 45 slices shown]
[im 1/45]
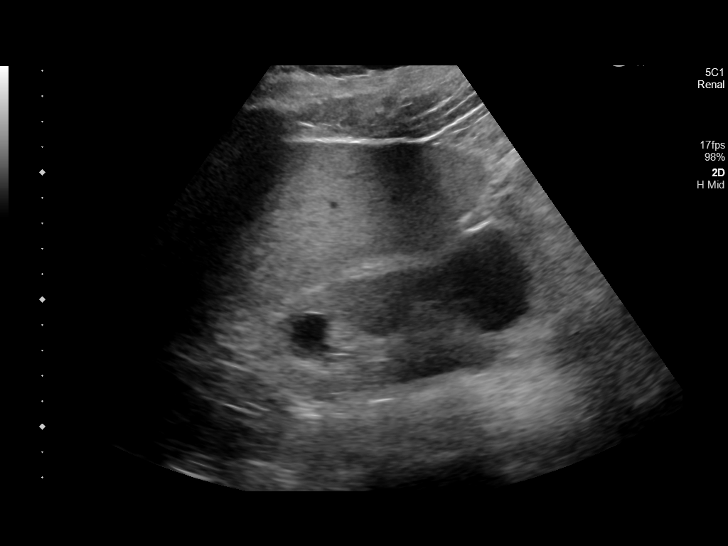
[im 4/45]
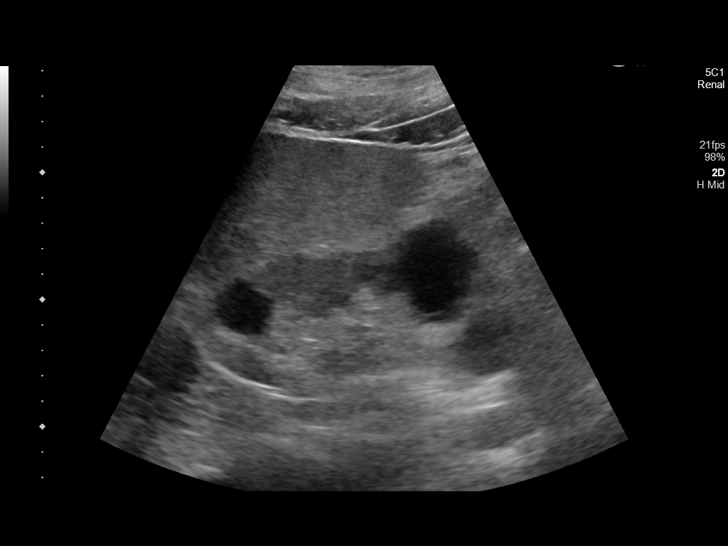
[im 8/45]
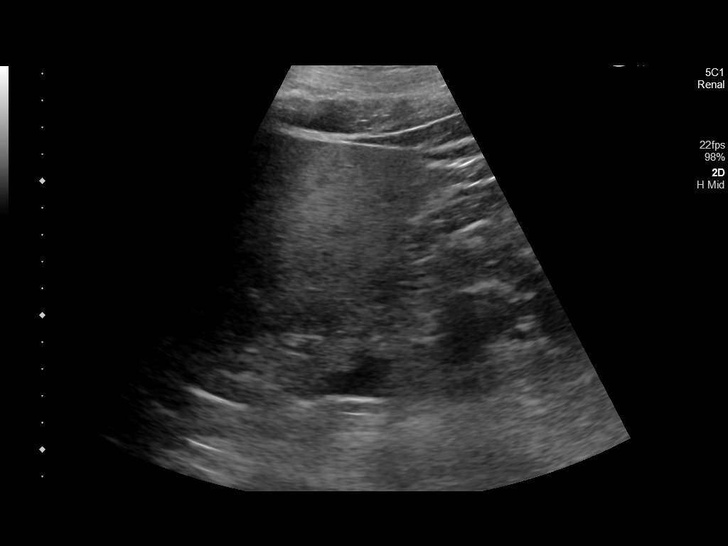
[im 12/45]
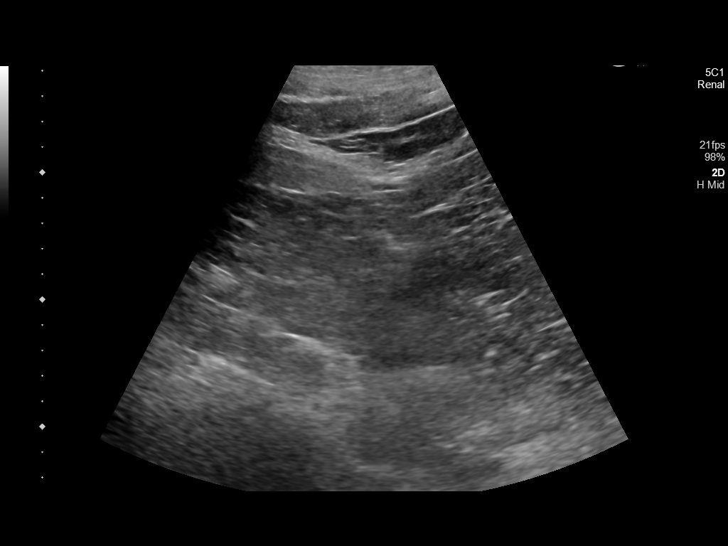
[im 15/45]
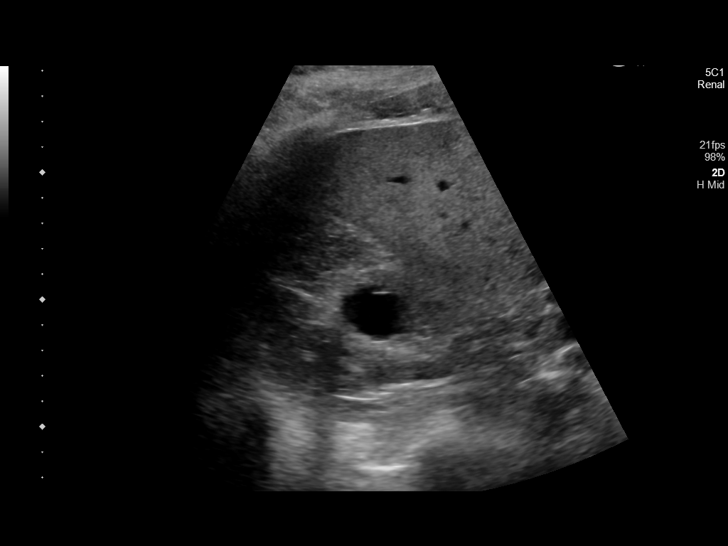
[im 17/45]
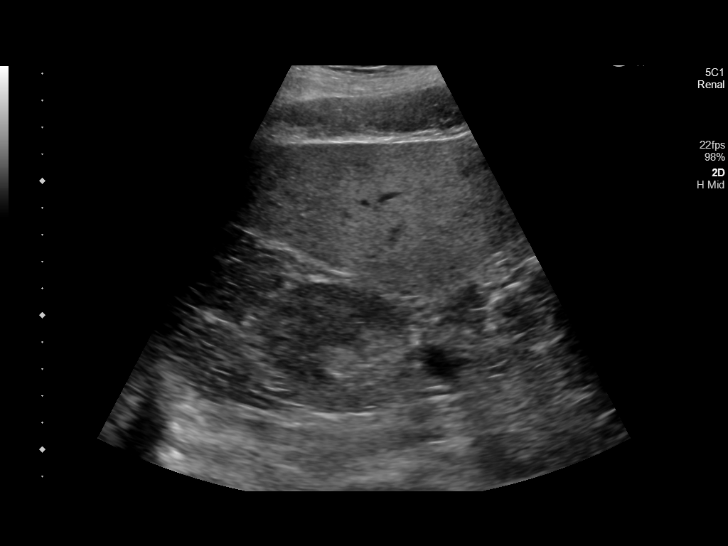
[im 21/45]
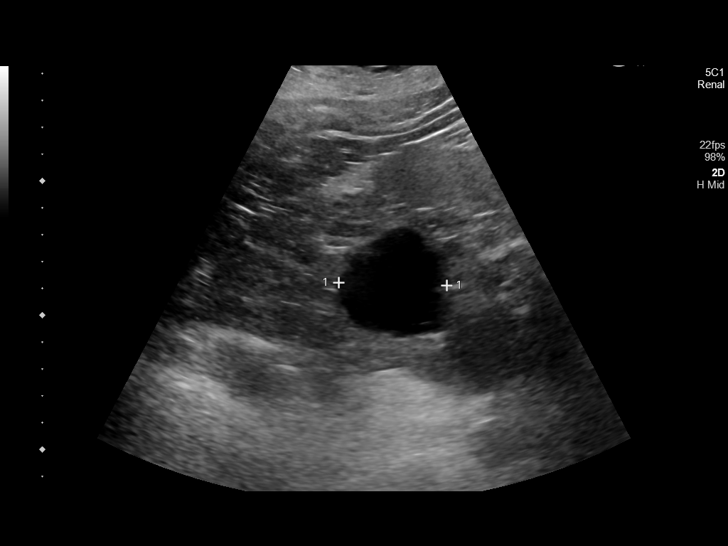
[im 24/45]
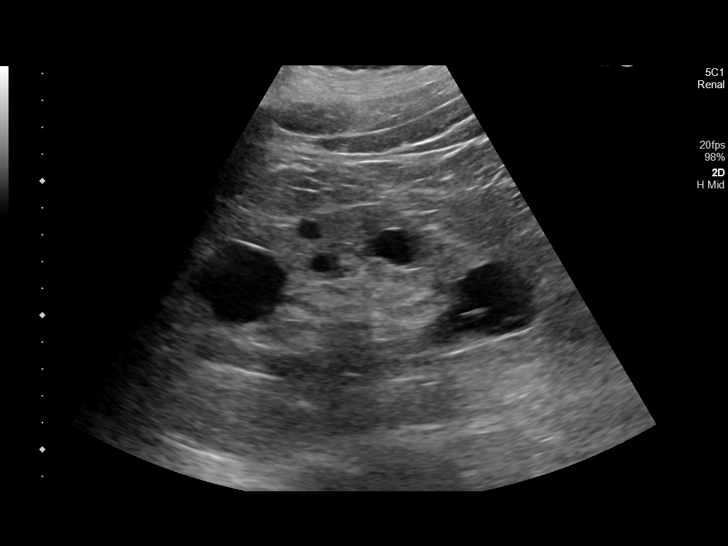
[im 28/45]
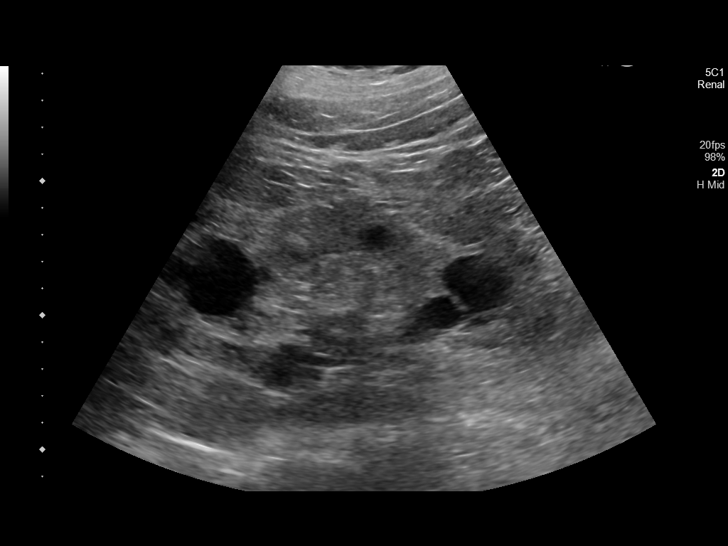
[im 30/45]
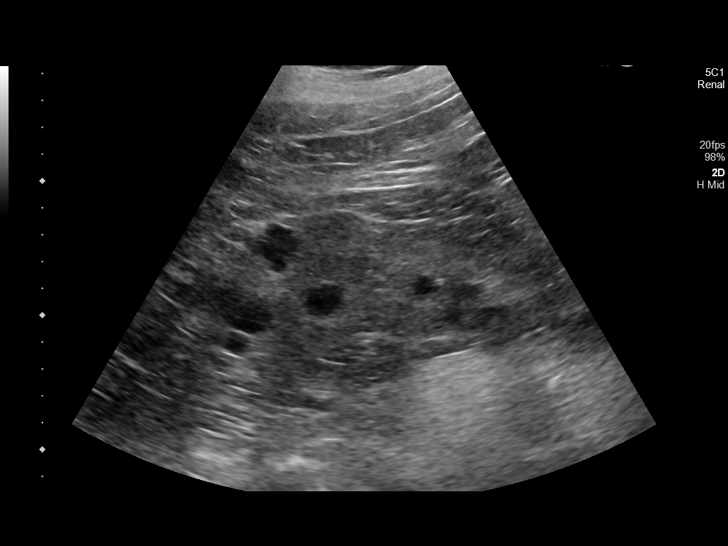
[im 34/45]
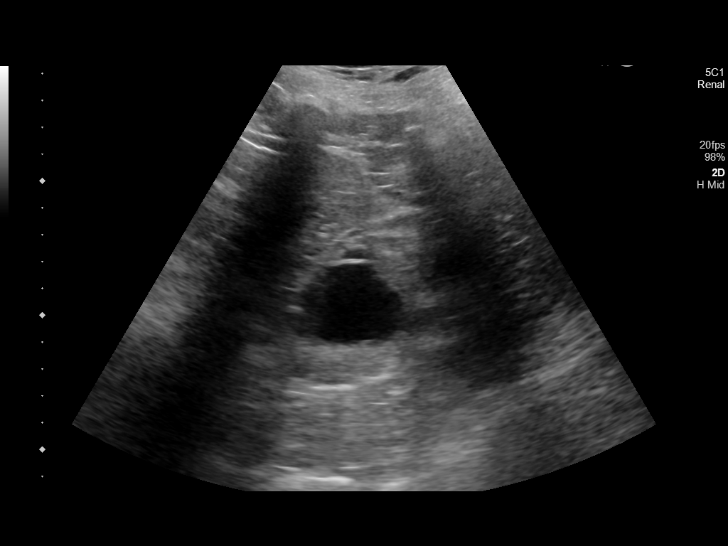
[im 37/45]
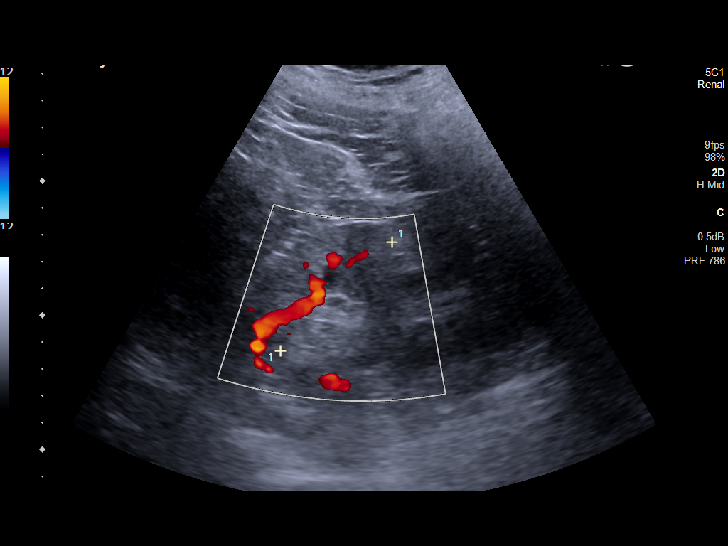
[im 41/45]
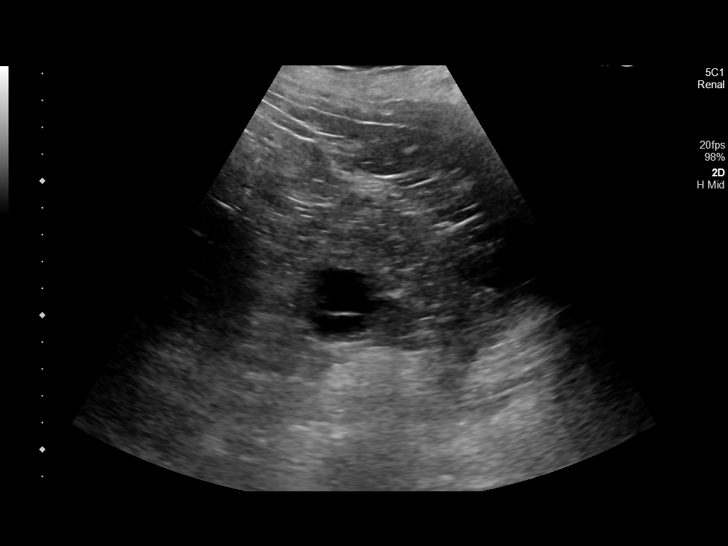
[im 45/45]
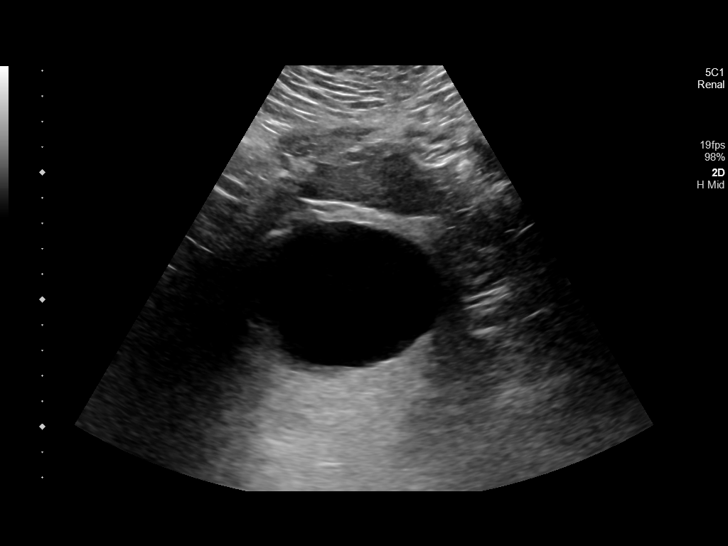

[14 of 25 positions shown; findings below may reference images not displayed]

FINDINGS: Right Kidney:

Renal measurements: 12.5 x 5.0 x 5.3 cm = volume: 172.6 mL. Contains
multiple cysts. A representative cyst measures 4.3 cm. Diffuse
increased cortical echogenicity.

Left Kidney:

Renal measurements: 12.8 x 6.1 x 5.8 cm = volume: 236.4 mL. Contains
multiple cysts. A representative cyst measures 3.1 cm. Diffuse
increased cortical echogenicity.

Bladder:

Appears normal for degree of bladder distention.

Other:

None.
IMPRESSION: 1. Medical renal disease.  Bilateral renal cysts.

## 2019-12-23 IMAGING — US US EXTREM LOW VENOUS
1 series · 13 of 24 positions shown · non-contrast
Comparison: None.

CLINICAL DATA: 54-year-old with lower extremity pain and edema.



[Series 1: us venous img lower bilat (dvt) · portal-venous · 13 of 60 slices shown]
[im 1/60]
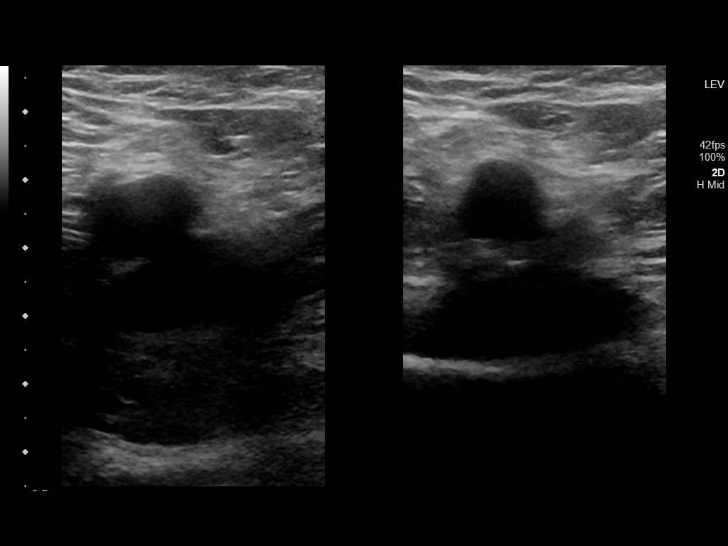
[im 6/60]
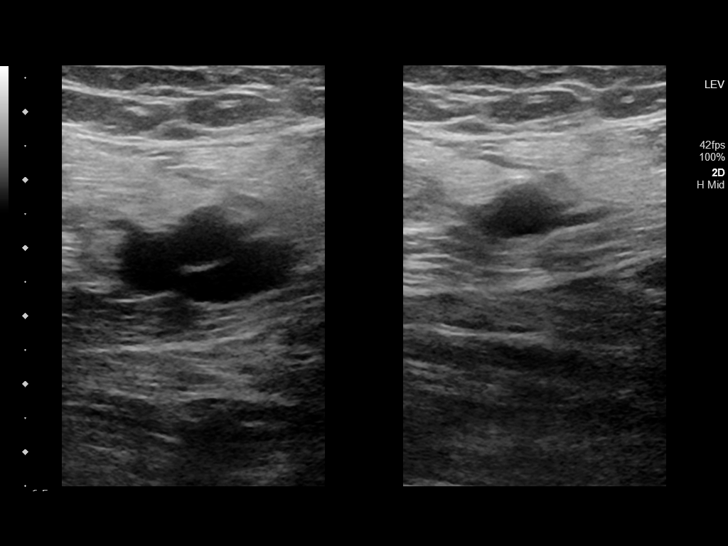
[im 11/60]
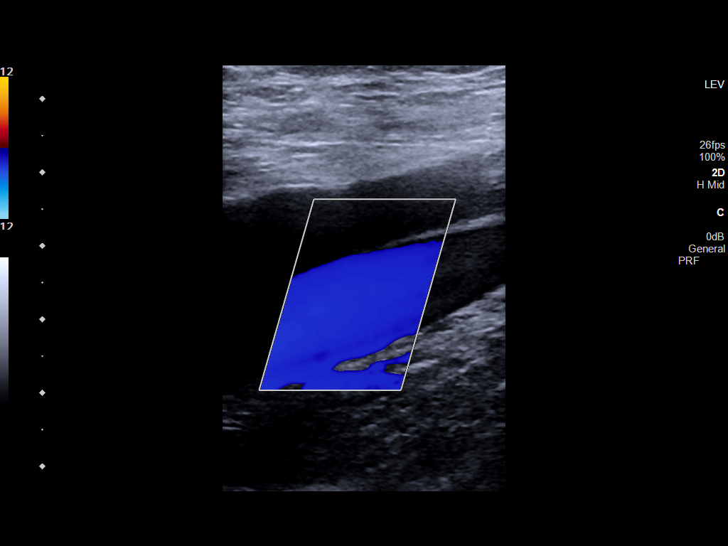
[im 16/60]
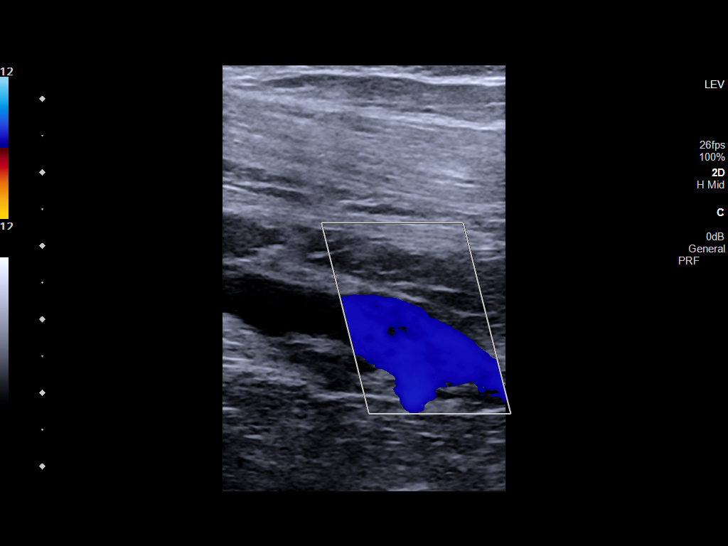
[im 21/60]
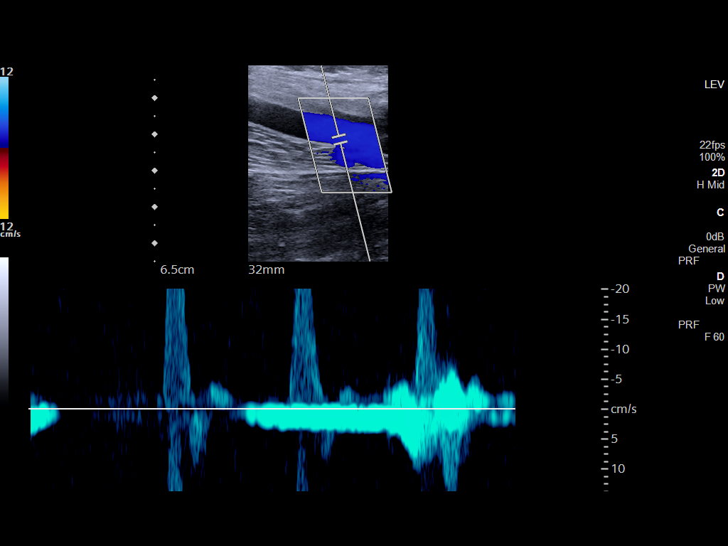
[im 26/60]
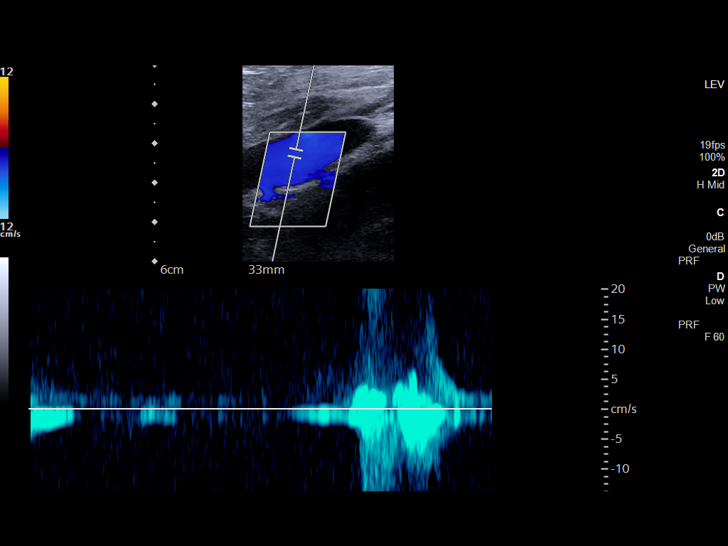
[im 31/60]
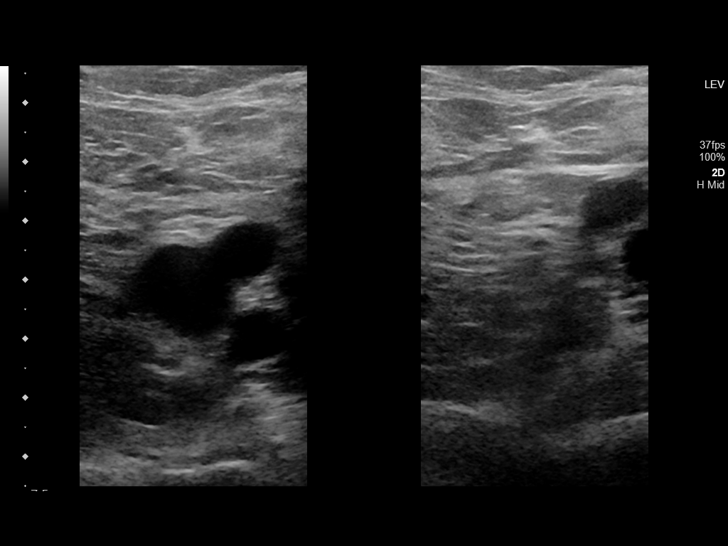
[im 34/60]
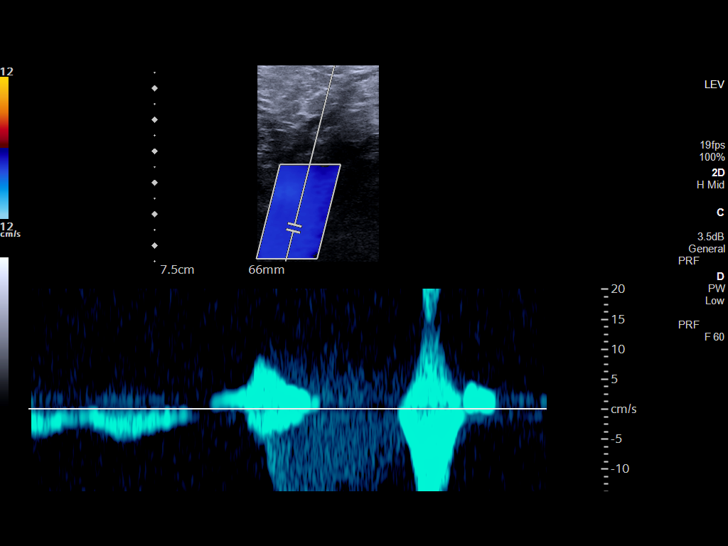
[im 39/60]
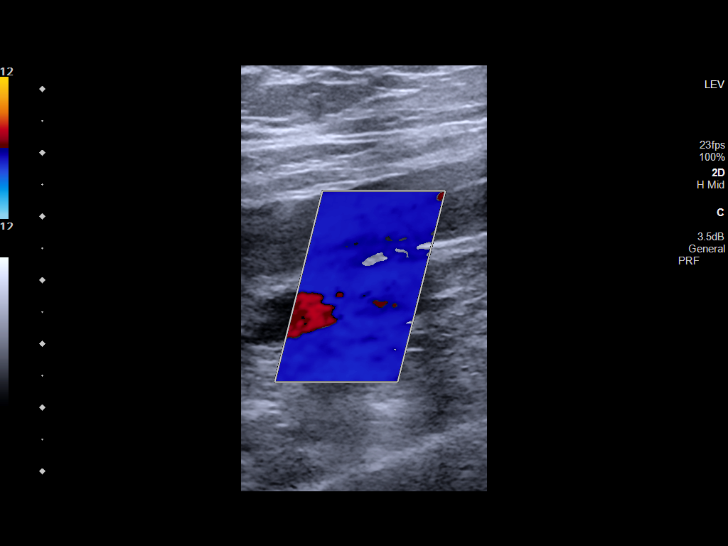
[im 44/60]
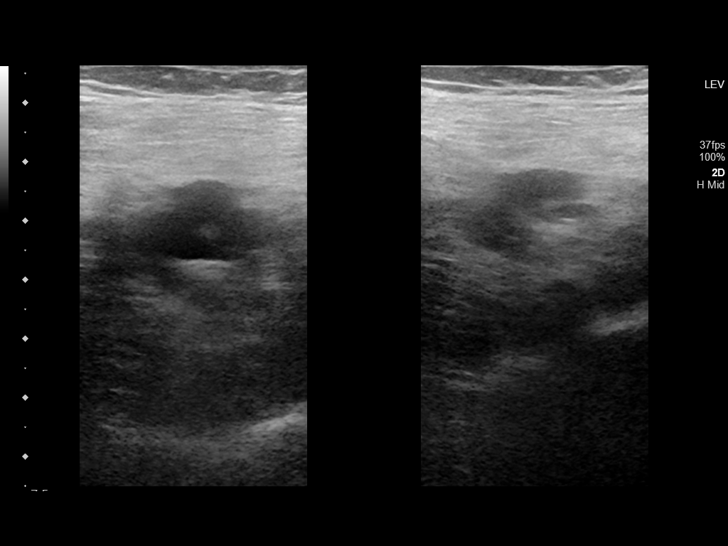
[im 49/60]
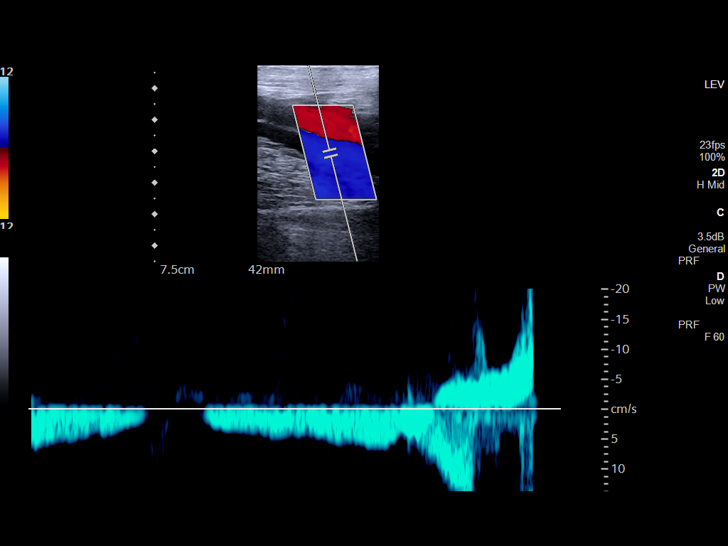
[im 54/60]
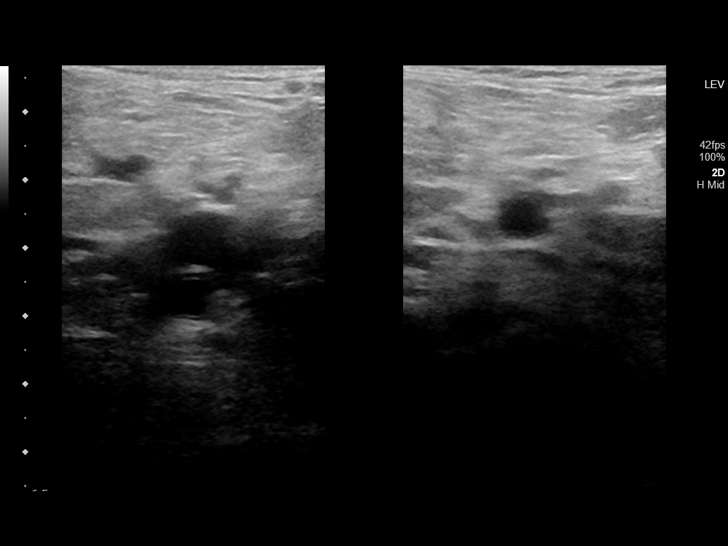
[im 60/60]
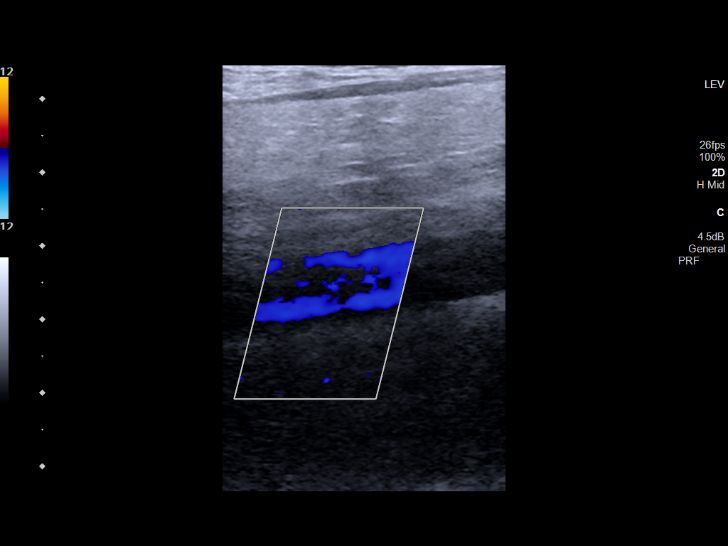

[13 of 24 positions shown; findings below may reference images not displayed]

FINDINGS: RIGHT LOWER EXTREMITY

Common Femoral Vein: No evidence of thrombus. Normal
compressibility, respiratory phasicity and response to augmentation.

Saphenofemoral Junction: No evidence of thrombus. Normal
compressibility and flow on color Doppler imaging.

Profunda Femoral Vein: No evidence of thrombus. Normal
compressibility and flow on color Doppler imaging.

Femoral Vein: No evidence of thrombus. Normal compressibility,
respiratory phasicity and response to augmentation.

Popliteal Vein: No evidence of thrombus. Normal compressibility,
respiratory phasicity and response to augmentation.

Calf Veins: No evidence of thrombus. Normal compressibility and flow
on color Doppler imaging.

Other Findings:  None.

LEFT LOWER EXTREMITY

Common Femoral Vein: No evidence of thrombus. Normal
compressibility, respiratory phasicity and response to augmentation.

Saphenofemoral Junction: No evidence of thrombus. Normal
compressibility and flow on color Doppler imaging.

Profunda Femoral Vein: No evidence of thrombus. Normal
compressibility and flow on color Doppler imaging.

Femoral Vein: No evidence of thrombus. Normal compressibility,
respiratory phasicity and response to augmentation.

Popliteal Vein: No evidence of thrombus. Normal compressibility,
respiratory phasicity and response to augmentation.

Calf Veins: No evidence of thrombus. Normal compressibility and flow
on color Doppler imaging.

Other Findings:  None.
IMPRESSION: No evidence of deep venous thrombosis in either lower extremity.

## 2019-12-23 MED ORDER — MELATONIN 5 MG PO TABS
2.5000 mg | ORAL_TABLET | Freq: Every evening | ORAL | Status: DC | PRN
  Administered 2019-12-23: 2.5 mg via ORAL
  Filled 2019-12-23: qty 1

## 2019-12-23 MED ORDER — INSULIN ASPART 100 UNIT/ML ~~LOC~~ SOLN: 0.0000 [IU] | Freq: Three times a day (TID) | SUBCUTANEOUS | Status: DC

## 2019-12-23 MED ORDER — INSULIN ASPART 100 UNIT/ML ~~LOC~~ SOLN
0.0000 [IU] | Freq: Three times a day (TID) | SUBCUTANEOUS | Status: DC
  Administered 2019-12-23: 5 [IU] via SUBCUTANEOUS
  Administered 2019-12-23: 8 [IU] via SUBCUTANEOUS
  Administered 2019-12-23: 5 [IU] via SUBCUTANEOUS
  Filled 2019-12-23 (×3): qty 1

## 2019-12-23 MED ORDER — SODIUM ZIRCONIUM CYCLOSILICATE 5 G PO PACK
10.0000 g | PACK | Freq: Once | ORAL | Status: AC
  Administered 2019-12-23: 10 g via ORAL
  Filled 2019-12-23: qty 2

## 2019-12-23 MED ORDER — INSULIN ASPART 100 UNIT/ML ~~LOC~~ SOLN
0.0000 [IU] | Freq: Three times a day (TID) | SUBCUTANEOUS | Status: DC
  Administered 2019-12-23: 11 [IU] via SUBCUTANEOUS
  Administered 2019-12-24: 7 [IU] via SUBCUTANEOUS
  Administered 2019-12-24 – 2019-12-26 (×8): 4 [IU] via SUBCUTANEOUS
  Administered 2019-12-26: 7 [IU] via SUBCUTANEOUS
  Administered 2019-12-26: 4 [IU] via SUBCUTANEOUS
  Administered 2019-12-27: 11 [IU] via SUBCUTANEOUS
  Administered 2019-12-27 (×2): 4 [IU] via SUBCUTANEOUS
  Administered 2019-12-28: 7 [IU] via SUBCUTANEOUS
  Administered 2019-12-28: 4 [IU] via SUBCUTANEOUS
  Administered 2019-12-28 (×2): 3 [IU] via SUBCUTANEOUS
  Administered 2019-12-29: 7 [IU] via SUBCUTANEOUS
  Administered 2019-12-29: 3 [IU] via SUBCUTANEOUS
  Administered 2019-12-29: 7 [IU] via SUBCUTANEOUS
  Administered 2019-12-29 – 2019-12-30 (×2): 4 [IU] via SUBCUTANEOUS
  Administered 2019-12-30: 7 [IU] via SUBCUTANEOUS
  Administered 2019-12-30: 4 [IU] via SUBCUTANEOUS
  Administered 2019-12-30: 3 [IU] via SUBCUTANEOUS
  Administered 2019-12-31: 4 [IU] via SUBCUTANEOUS
  Administered 2019-12-31 (×2): 3 [IU] via SUBCUTANEOUS
  Administered 2020-01-01 (×2): 4 [IU] via SUBCUTANEOUS
  Administered 2020-01-01 – 2020-01-02 (×2): 3 [IU] via SUBCUTANEOUS
  Administered 2020-01-03 – 2020-01-04 (×2): 4 [IU] via SUBCUTANEOUS
  Administered 2020-01-04 – 2020-01-06 (×3): 3 [IU] via SUBCUTANEOUS
  Administered 2020-01-08: 12:00:00 4 [IU] via SUBCUTANEOUS
  Administered 2020-01-08 – 2020-01-11 (×3): 3 [IU] via SUBCUTANEOUS
  Administered 2020-01-13 (×2): 4 [IU] via SUBCUTANEOUS
  Administered 2020-01-14 – 2020-01-16 (×4): 3 [IU] via SUBCUTANEOUS
  Filled 2019-12-23 (×51): qty 1

## 2019-12-23 NOTE — Progress Notes (Signed)
Reports from the care RN about new onset LUE numbness and weakness.  Neuro assessment performed bedside.  Patient admitted that LUE numbness is not new, "comes and goes", but that this episode began yesterday. PERRL +3 MAE, generalized weakness, with very slight increased weakness in LUE Numbness extending in LUE from elbow to fingertips.  Noted difference in coordination. Patient reports duller sensation in that LUE. NIHSS: 3  Stat CT head ordered to rule out CVA.  Patient on eliquis at home and was transitioned to heparin drip inpatient due to renal function.  Domingo Pulse Rust[place], AGACNP-BC [facility]    Please see Amion for pager details.

## 2019-12-23 NOTE — Consult Note (Signed)
ANTICOAGULATION CONSULT NOTE   Pharmacy Consult for Heparin Infusion Indication: atrial fibrillation  Patient Measurements: Height: 5\' 11"  (180.3 cm) Weight: 123.6 kg (272 lb 7.8 oz) IBW/kg (Calculated) : 75.3 Heparin Dosing Weight: 104.8 kg  Labs: Recent Labs    12/22/19 0849 12/23/19 0351 12/23/19 1803  HGB 16.4 14.8  --   HCT 49.5 43.9  --   PLT 223 210  --   APTT 31  --  81*  LABPROT 13.3  --   --   INR 1.1  --   --   HEPARINUNFRC  --  1.77*  --   CREATININE 3.40* 3.59*  --     Estimated Creatinine Clearance: 31.1 mL/min (A) (by C-G formula based on SCr of 3.59 mg/dL (H)).   Medical History: Past Medical History:  Diagnosis Date  . Alcohol abuse   . 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 12/2007 MV: EF 28%, no isch;  b. 8/12 Echo: EF 25-35%; c. 02/2014 Echo: EF 20-25%; d. 12/2014 Cath: minimal CAD; e. 01/2015 Echo: EF 50-55%;  d. 05/2016 Echo: EF 30-35%, diff HK, gr2 DD; e. 11/2016 Echo: EF 45-50%, diff HK.  . Chronic combined systolic (congestive) and diastolic (congestive) heart failure ([place])    a. 05/2016 Most recent Echo: EF 30-35%, diff HK, Gr2 DD, mild MR, mod dil LA; b. 11/2016 Echo: EF 45-50%, diff HK.  . CKD (chronic kidney disease), stage III ([place])   . Elevated troponin    Chronically elevated. - level was 0.17-0.10 during recent admission  . Essential hypertension   . GI bleed 11/2013  . Hyperlipidemia   . Pancreatitis   . Paroxysmal A-fib ([place])    a. new onset s/p unsuccessful TEE/DCCV on 08/16/2014; b. on amio/eliquis (CHA2DS2VASc = 2-3); c. reports 1-2 hrs of afib ~ q39mos.  . Paroxysmal atrial flutter ([place])    a. new onset 07/2014; b. s/p unsuccessful TEE/DCCV 08/16/2014; c. on apixaban.  . Sleep-disordered breathing    Has yet to have a sleep study    Medications:  Apixaban 5 mg BID for atrial fibrillation (last dose 10/29 at 1839)  Assessment: Patient is a 54 y/o M with medical history including atrial fibrillation on apixaban, CHF, CKD,  alcohol abuse who is admitted with severe COVID-19 pneumonia with ARDS. Admission further complicated by AKI. Pharmacy has been consulted to initiate heparin infusion in this patient due to concerns about clearance of apixaban given renal dysfunction.   Per chart, patient is currently in NSR. Baseline aPTT 31, INR 1.1, anti-Xa 1.77. H&H, platelets within normal limits. D-dimer elevated.   Goal of Therapy:  Heparin level 0.3-0.7 units/ml aPTT 66 - 102 seconds Monitor platelets by anticoagulation protocol: Yes   Plan:  --10/30 at 1803 aPTT = 81s, therapeutic x 1. Will continue heparin infusion at 1450 units/hr --Will order confirmatory aPTT in 8 hours. Continue to follow aPTT given apixaban interference with anti-Xa level. Can switch over to anti-Xa monitoring when correlation is established --Daily CBC per protocol  [name] 12/23/2019,6:54 PM

## 2019-12-23 NOTE — Consult Note (Signed)
[facility]  CONSULT NOTE    Date: 12/23/2019                  Patient Name:  Hector [name]  MRN: [id number]  DOB: 12/18/1965  Age / Sex: 54 y.o., male         PCP: [name], MD                 Service Requesting Consult: Dr. Bretta Bang                 Reason for Consult: Acute renal failure            History of Present Illness: Hector [name] admitted to COVID-19 infection. Nephrology consulted for acute kidney injury on chronic kidney disease stage IIIB complicated with hyperkalemia, hyponatremia, metabolic acidosis with anion gap.  Patient is short of breath and weak and unable to give much of a history. History mostly taken from chart. Patient follows with [facility].  Shortness of breath, dry cough, weakness, subjective fevers for last 2-3 weeks. He lives alone and was unable to care for himself.    Medications: Outpatient medications: Medications Prior to Admission  Medication Sig Dispense Refill Last Dose  . amiodarone (PACERONE) 200 MG tablet Take 1 tablet (200 mg total) by mouth daily. 90 tablet 3 Past Month at Unknown time  . apixaban (ELIQUIS) 5 MG TABS tablet Take 1 tablet (5 mg total) by mouth 2 (two) times daily. 60 tablet 5 Past Month at Unknown time  . atorvastatin (LIPITOR) 80 MG tablet Take 1 tablet (80 mg total) by mouth daily. 30 tablet 5 Past Month at Unknown time  . carvedilol (COREG) 3.125 MG tablet Take 1 tablet (3.125 mg total) by mouth 2 (two) times daily with a meal. 180 tablet 3 Past Month at Unknown time  . furosemide (LASIX) 40 MG tablet Take 1 tablet (40 mg total) by mouth daily. 90 tablet 3 Past Month at Unknown time  . HYDROcodone-acetaminophen (NORCO/VICODIN) 5-325 MG tablet Take 1 tablet by mouth every 6 (six) hours as needed for moderate pain. 15 tablet 0 Past Month at Unknown time  . levothyroxine (SYNTHROID, LEVOTHROID) 100 MCG tablet Take 100 mcg by mouth daily before breakfast.   Past Month  at Unknown time  . sacubitril-valsartan (ENTRESTO) 97-103 MG Take 1 tablet by mouth 2 (two) times daily. 60 tablet 5 Past Month at Unknown time  . allopurinol (ZYLOPRIM) 100 MG tablet Take 100 mg by mouth daily as needed.   Unknown at Unknown time  . predniSONE (DELTASONE) 20 MG tablet Take 2 tablets for 5 days and then 1 tablet once a day for 5 days (Patient not taking: Reported on 12/22/2019) 15 tablet 0 Not Taking at Unknown time    Current medications: Current Facility-Administered Medications  Medication Dose Route Frequency Provider Last Rate Last Admin  . allopurinol (ZYLOPRIM) tablet 100 mg  100 mg Oral Daily Rosine Door, MD   100 mg at 12/23/19 0737  . amiodarone (PACERONE) tablet 200 mg  200 mg Oral Daily Rosine Door, MD   200 mg at 12/23/19 1062  . atorvastatin (LIPITOR) tablet 80 mg  80 mg Oral QHS Rosine Door, MD   80 mg at 12/22/19 2227  . baricitinib (OLUMIANT) tablet 1 mg  1 mg Oral Daily Rosine Door, MD   1 mg at 12/23/19 6948  . carvedilol (COREG) tablet 3.125 mg  3.125 mg Oral BID WC Rosine Door, MD  3.125 mg at 12/23/19 0827  . cefTRIAXone (ROCEPHIN) 2 g in sodium chloride 0.9 % 100 mL IVPB  2 g Intravenous Q24H Rust-Chester, Huel Cote, NP   Stopped at 12/22/19 2308  . Chlorhexidine Gluconate Cloth 2 % PADS 6 each  6 each Topical Daily Awilda Bill, NP      . dexamethasone (DECADRON) injection 6 mg  6 mg Intravenous Q24H Rosine Door, MD   6 mg at 12/23/19 3335  . docusate sodium (COLACE) capsule 100 mg  100 mg Oral BID PRN Rosine Door, MD      . heparin ADULT infusion 100 units/mL (25000 units/236mL sodium chloride 0.45%)  1,450 Units/hr Intravenous Continuous [name], RPH 14.5 mL/hr at 12/23/19 0940 1,450 Units/hr at 12/23/19 0940  . HYDROcodone-acetaminophen (NORCO/VICODIN) 5-325 MG per tablet 1 tablet  1 tablet Oral Q6H PRN Rosine Door, MD   1 tablet at 12/23/19 0516  . insulin aspart (novoLOG) injection 0-15 Units  0-15 Units  Subcutaneous TID WC Rust-Chester, Britton L, NP   5 Units at 12/23/19 1127  . levothyroxine (SYNTHROID) tablet 100 mcg  100 mcg Oral QAC breakfast Rosine Door, MD   100 mcg at 12/23/19 0516  . polyethylene glycol (MIRALAX / GLYCOLAX) packet 17 g  17 g Oral Daily PRN Rosine Door, MD      . remdesivir 100 mg in sodium chloride 0.9 % 100 mL IVPB  100 mg Intravenous Daily Rosine Door, MD 200 mL/hr at 12/23/19 0900 Rate Verify at 12/23/19 0900      Allergies: Allergies  Allergen Reactions  . Esomeprazole Magnesium Other (See Comments)    Suspected interstitial nephritis 2018  . Eggs Or Egg-Derived Products Rash      Past Medical History: Past Medical History:  Diagnosis Date  . Alcohol abuse   . 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 12/2007 MV: EF 28%, no isch;  b. 8/12 Echo: EF 25-35%; c. 02/2014 Echo: EF 20-25%; d. 12/2014 Cath: minimal CAD; e. 01/2015 Echo: EF 50-55%;  d. 05/2016 Echo: EF 30-35%, diff HK, gr2 DD; e. 11/2016 Echo: EF 45-50%, diff HK.  . Chronic combined systolic (congestive) and diastolic (congestive) heart failure ([place])    a. 05/2016 Most recent Echo: EF 30-35%, diff HK, Gr2 DD, mild MR, mod dil LA; b. 11/2016 Echo: EF 45-50%, diff HK.  . CKD (chronic kidney disease), stage III ([place])   . Elevated troponin    Chronically elevated. - level was 0.17-0.10 during recent admission  . Essential hypertension   . GI bleed 11/2013  . Hyperlipidemia   . Pancreatitis   . Paroxysmal A-fib ([place])    a. new onset s/p unsuccessful TEE/DCCV on 08/16/2014; b. on amio/eliquis (CHA2DS2VASc = 2-3); c. reports 1-2 hrs of afib ~ q25mos.  . Paroxysmal atrial flutter ([place])    a. new onset 07/2014; b. s/p unsuccessful TEE/DCCV 08/16/2014; c. on apixaban.  . Sleep-disordered breathing    Has yet to have a sleep study     Past Surgical History: Past Surgical History:  Procedure Laterality Date  . CARDIAC CATHETERIZATION N/A 01/09/2015   Procedure: Left Heart Cath and Coronary  Angiography;  Surgeon: [name], MD;  Location: [facility];  Service: Cardiovascular;  Laterality: N/A;  . ELECTROPHYSIOLOGIC STUDY N/A 08/16/2014   Procedure: CARDIOVERSION;  Surgeon: [name], MD;  Location: [facility];  Service: Cardiovascular;  Laterality: N/A;  . FLEXIBLE SIGMOIDOSCOPY N/A 10/10/2015   Procedure: FLEXIBLE SIGMOIDOSCOPY;  Surgeon: [name],  MD;  Location: [facility];  Service: Endoscopy;  Laterality: N/A;  . KNEE SURGERY Right   . NM MYOVIEW LTD  November 2011   No ischemia or infarction. EF 50-55% ( no improvement from 2009 Myoview EF of 28%  . TEE WITHOUT CARDIOVERSION N/A 08/16/2014   Procedure: TRANSESOPHAGEAL ECHOCARDIOGRAM (TEE);  Surgeon: [name], MD;  Location: [facility];  Service: Cardiovascular;  Laterality: N/A;     Family History: Family History  Problem Relation Age of Onset  . Hypertension Mother   . Hyperlipidemia Mother   . Diabetes Mother      Social History: Social History   Socioeconomic History  . Marital status: Single    Spouse name: Not on file  . Number of children: Not on file  . Years of education: Not on file  . Highest education level: Not on file  Occupational History  . Not on file  Tobacco Use  . Smoking status: Former Smoker    Packs/day: 1.00    Years: 12.00    Pack years: 12.00    Quit date: 02/23/1998    Years since quitting: 21.8  . Smokeless tobacco: Never Used  Vaping Use  . Vaping Use: Never used  Substance and Sexual Activity  . Alcohol use: No    Alcohol/week: 0.0 standard drinks    Comment: 4 heavy alcohol drinker. Quit 2 years ago.  . Drug use: No  . Sexual activity: Not Currently  Other Topics Concern  . Not on file  Social History Narrative  . Not on file   Social Determinants of Health   Financial Resource Strain: Low Risk   . Difficulty of Paying Living Expenses: Not hard at all  Food Insecurity: No Food Insecurity  . Worried About Charity fundraiser  in the Last Year: Never true  . Ran Out of Food in the Last Year: Never true  Transportation Needs: No Transportation Needs  . Lack of Transportation (Medical): No  . Lack of Transportation (Non-Medical): No  Physical Activity: Inactive  . Days of Exercise per Week: 0 days  . Minutes of Exercise per Session: 0 min  Stress: No Stress Concern Present  . Feeling of Stress : Not at all  Social Connections: Moderately Isolated  . Frequency of Communication with Friends and Family: More than three times a week  . Frequency of Social Gatherings with Friends and Family: More than three times a week  . Attends Religious Services: 1 to 4 times per year  . Active Member of Clubs or Organizations: No  . Attends Archivist Meetings: Never  . Marital Status: Never married  Intimate Partner Violence: Not At Risk  . Fear of Current or Ex-Partner: No  . Emotionally Abused: No  . Physically Abused: No  . Sexually Abused: No     Review of Systems: Review of Systems  Unable to perform ROS: Critical illness    Vital Signs: Blood pressure (!) 149/103, pulse (!) 54, temperature (!) 97.4 F (36.3 C), temperature source Axillary, resp. rate (!) 24, height 5\' 11"  (1.803 m), weight 123.6 kg, SpO2 97 %.  Weight trends: Filed Weights   12/22/19 0846 12/22/19 2100 12/23/19 0500  Weight: 129.7 kg 123.6 kg 123.6 kg    Physical Exam: General: Critically ill  Head: Normocephalic, atraumatic. Moist oral mucosal membranes  Eyes: Anicteric, PERRL  Neck: Supple, trachea midline  Lungs:  Bilateral crackles, HFNC FiO2 75%  Heart: bradycardia  Abdomen:  Soft, nontender, obese  Extremities:  no peripheral edema.  Neurologic: Weak, slow to respond, alert and oriented  Skin: No lesions         Lab results: Basic Metabolic Panel: Recent Labs  Lab 12/22/19 0849 12/23/19 0351  NA 134* 131*  K 4.6 5.4*  CL 100 98  CO2 19* 19*  GLUCOSE 118* 205*  BUN 60* 71*  CREATININE 3.40* 3.59*   CALCIUM 8.5* 8.0*    Liver Function Tests: Recent Labs  Lab 12/22/19 0849 12/23/19 0351  AST 88* 61*  ALT 39 39  ALKPHOS 43 39  BILITOT 1.3* 1.0  PROT 7.3 6.6  ALBUMIN 3.1* 2.6*   No results for input(s): LIPASE, AMYLASE in the last 168 hours. No results for input(s): AMMONIA in the last 168 hours.  CBC: Recent Labs  Lab 12/22/19 0849 12/23/19 0351  WBC 7.1 4.5  NEUTROABS 5.9 3.5  HGB 16.4 14.8  HCT 49.5 43.9  MCV 86.7 86.2  PLT 223 210    Cardiac Enzymes: No results for input(s): CKTOTAL, CKMB, CKMBINDEX, TROPONINI in the last 168 hours.  BNP: Invalid input(s): POCBNP  CBG: Recent Labs  Lab 12/22/19 2105 12/23/19 0746 12/23/19 1113  GLUCAP 203* 236* 220*    Microbiology: Results for orders placed or performed during the hospital encounter of 12/22/19  Blood Culture (routine x 2)     Status: None (Preliminary result)   Collection Time: 12/22/19  8:49 AM   Specimen: BLOOD  Result Value Ref Range Status   Specimen Description BLOOD RT [facility]  Final   Special Requests   Final    BOTTLES DRAWN AEROBIC AND ANAEROBIC Blood Culture adequate volume   Culture   Final    NO GROWTH < 24 HOURS Performed at [facility], [address] [place], [place] [address]    Report Status PENDING  Incomplete  Blood Culture (routine x 2)     Status: None (Preliminary result)   Collection Time: 12/22/19  8:49 AM   Specimen: BLOOD  Result Value Ref Range Status   Specimen Description BLOOD RT HAND  Final   Special Requests   Final    BOTTLES DRAWN AEROBIC AND ANAEROBIC Blood Culture adequate volume   Culture   Final    NO GROWTH < 24 HOURS Performed at [facility], [address], [place], [place] [address]    Report Status PENDING  Incomplete  Respiratory Panel by RT PCR (Flu A&B, Covid) - Nasopharyngeal Swab     Status: Abnormal   Collection Time: 12/22/19  8:49 AM   Specimen: Nasopharyngeal Swab  Result Value Ref Range Status   SARS Coronavirus  2 by RT PCR POSITIVE (A) NEGATIVE Final    Comment: RESULT CALLED TO, READ BACK BY AND VERIFIED WITH: KENDALL FLANNIGAN AT 4259 ON 12/22/2019 [place]. (NOTE) SARS-CoV-2 target nucleic acids are DETECTED.  SARS-CoV-2 RNA is generally detectable in upper respiratory specimens  during the acute phase of infection. Positive results are indicative of the presence of the identified virus, but do not rule out bacterial infection or co-infection with other pathogens not detected by the test. Clinical correlation with patient history and other diagnostic information is necessary to determine patient infection status. The expected result is Negative.  Fact Sheet for Patients:  PinkCheek.be  Fact Sheet for Healthcare Providers: GravelBags.it  This test is not yet approved or cleared by the [place] FDA and  has been authorized for detection and/or diagnosis of SARS-CoV-2 by FDA under an Emergency Use Authorization (  EUA).  This EUA will remain in effect (meaning this te st can be used) for the duration of  the COVID-19 declaration under Section 564(b)(1) of the Act, 21 U.S.C. section 360bbb-3(b)(1), unless the authorization is terminated or revoked sooner.      Influenza A by PCR NEGATIVE NEGATIVE Final   Influenza B by PCR NEGATIVE NEGATIVE Final    Comment: (NOTE) The Xpert Xpress SARS-CoV-2/FLU/RSV assay is intended as an aid in  the diagnosis of influenza from Nasopharyngeal swab specimens and  should not be used as a sole basis for treatment. Nasal washings and  aspirates are unacceptable for Xpert Xpress SARS-CoV-2/FLU/RSV  testing.  Fact Sheet for Patients: PinkCheek.be  Fact Sheet for Healthcare Providers: GravelBags.it  This test is not yet approved or cleared by the [place] FDA and  has been authorized for detection and/or diagnosis of SARS-CoV-2 by   FDA under an Emergency Use Authorization (EUA). This EUA will remain  in effect (meaning this test can be used) for the duration of the  Covid-19 declaration under Section 564(b)(1) of the Act, 21  U.S.C. section 360bbb-3(b)(1), unless the authorization is  terminated or revoked. Performed at [facility], [address] [place], [place] [address]   MRSA PCR Screening     Status: None   Collection Time: 12/22/19  9:16 PM   Specimen: Nasal Mucosa; Nasopharyngeal  Result Value Ref Range Status   MRSA by PCR NEGATIVE NEGATIVE Final    Comment:        The GeneXpert MRSA Assay (FDA approved for NASAL specimens only), is one component of a comprehensive MRSA colonization surveillance program. It is not intended to diagnose MRSA infection nor to guide or monitor treatment for MRSA infections. Performed at [facility], [address] [place], [place] [address]     Coagulation Studies: Recent Labs    12/22/19 0849  LABPROT 13.3  INR 1.1    Urinalysis: Recent Labs    12/22/19 0849  COLORURINE AMBER*  LABSPEC 1.017  PHURINE 5.0  GLUCOSEU NEGATIVE  HGBUR LARGE*  BILIRUBINUR NEGATIVE  KETONESUR NEGATIVE  PROTEINUR >=300*  NITRITE NEGATIVE  LEUKOCYTESUR MODERATE*      Imaging: DG Chest Port 1 View  Result Date: 12/22/2019 CLINICAL DATA:  Questionable sepsis.  Evaluate for abnormality. EXAM: PORTABLE CHEST 1 VIEW COMPARISON:  12/13/2019 FINDINGS: Extensive bilateral airspace opacities. No visible pleural effusions or pneumothorax. Cardiac silhouette is partially obscured, but likely mildly enlarged. No acute osseous abnormality. IMPRESSION: Extensive bilateral airspace opacities, concerning for multifocal pneumonia. Recommend correlation with COVID testing. Electronically Signed   By: Margaretha Sheffield MD   On: 12/22/2019 09:06      Assessment & Plan: Hector [name] is a 54 y.o. black male with atrial fibrillation, hyperlipidemia,  congestive heart failure, alcohol abuse, hypothyroidism who was admitted to [facility] on 12/22/2019 for Hypoxia [R09.02] Pneumonia due to COVID-19 virus [U07.1, J12.82] COVID-19 [U07.1]   1. Acute kidney injury with hyperkalemia, metabolic acidosis and hyponatremia on chronic kidney disease stage IIIB. Baseline creatinine of 2.37, GFR of 30 on 02/08/2019. Followed by [facility].  - holding furosemide, entresto.  Milly Jakob - check renal ultrasound  2. Hypertension: with urgency on admission. Current regimen of carvedilol.  - start PRN hydralazine.    LOS: 1 Hector [name] 10/30/202112:15 PM

## 2019-12-23 NOTE — Progress Notes (Signed)
PHARMACY CONSULT NOTE  Pharmacy Consult for Electrolyte Monitoring and Replacement   Recent Labs: Potassium (mmol/L)  Date Value  12/23/2019 4.8  03/08/2014 3.9   Magnesium (mg/dL)  Date Value  12/23/2019 2.4   Calcium (mg/dL)  Date Value  12/23/2019 8.0 (L)   Calcium, Total (mg/dL)  Date Value  03/08/2014 7.9 (L)   Albumin (g/dL)  Date Value  12/23/2019 2.6 (L)  10/11/2018 4.2  03/07/2014 3.4   Phosphorus (mg/dL)  Date Value  12/23/2019 4.0   Sodium (mmol/L)  Date Value  12/23/2019 131 (L)  10/11/2018 141  03/08/2014 135 (L)   Corrected Ca: 9.12 mg/dL  Assessment: 54 y/o M with medical history including atrial fibrillation on apixaban, CHF, CKD, alcohol abuse who is admitted with severe COVID-19 pneumonia with ARDS. Admission further complicated by AKI.   Goal of Therapy:  Potassium 4.0 - 5.1 mmol/L Magnesium 2.0 - 2.4 mg/dL All Other Electrolytes WNL  Plan:   10/30 at 1803 K = 4.8, improved with Lokelma 10 g x 1. UOP 1050 mL today so far  Will follow-up all electrolytes tomorrow AM  [name] 12/23/2019 6:59 PM

## 2019-12-23 NOTE — Progress Notes (Signed)
PHARMACY CONSULT NOTE  Pharmacy Consult for Electrolyte Monitoring and Replacement   Recent Labs: Potassium (mmol/L)  Date Value  12/23/2019 5.4 (H)  03/08/2014 3.9   Magnesium (mg/dL)  Date Value  08/14/2014 1.7   Calcium (mg/dL)  Date Value  12/23/2019 8.0 (L)   Calcium, Total (mg/dL)  Date Value  03/08/2014 7.9 (L)   Albumin (g/dL)  Date Value  12/23/2019 2.6 (L)  10/11/2018 4.2  03/07/2014 3.4   Phosphorus (mg/dL)  Date Value  08/14/2014 4.0   Sodium (mmol/L)  Date Value  12/23/2019 131 (L)  10/11/2018 141  03/08/2014 135 (L)   Corrected Ca: 9.12 mg/dL  Assessment: 54 y/o M with medical history including atrial fibrillation on apixaban, CHF, CKD, alcohol abuse who is admitted with severe COVID-19 pneumonia with ARDS. Admission further complicated by AKI.   Goal of Therapy:  Potassium 4.0 - 5.1 mmol/L Magnesium 2.0 - 2.4 mg/dL All Other Electrolytes WNL  Plan:   Lokelma 10 grams po x 1  Re-check electrolytes at 1800  Dallie Piles ,PharmD Clinical Pharmacist 12/23/2019 12:12 PM

## 2019-12-23 NOTE — Progress Notes (Addendum)
CRITICAL CARE NOTE  CC  follow up respiratory failure  SUBJECTIVE Patient remains critically ill C/o sob and cough. No CP No n/v/d, tolerating po but poor intake  Prognosis is guarded     SIGNIFICANT EVENTS    BP (!) 149/103 (BP Location: Right Arm)   Pulse (!) 54   Temp (!) 97.4 F (36.3 C) (Axillary)   Resp (!) 24   Ht 5\' 11"  (1.803 m)   Wt 123.6 kg   SpO2 97%   BMI 38.00 kg/m     PHYSICAL EXAMINATION:  GENERAL:critically ill appearing, +resp distress, feels weak HEAD: Normocephalic, atraumatic.  EYES: Pupils equal, round, reactive to light.  No scleral icterus.  MOUTH: Moist mucosal membrane. NECK: Supple. No thyromegaly. No nodules. No JVD.  PULMONARY: bibasilar crackles without wheezing CARDIOVASCULAR: S1 and S2. Regular rate and rhythm. No murmurs, rubs, or gallops.  GASTROINTESTINAL: Soft, nontender, -distended. No masses. Positive bowel sounds. No hepatosplenomegaly.  MUSCULOSKELETAL: No swelling, clubbing, or edema.  NEUROLOGIC: awake and alertSKIN:intact,warm,dry  INTAKE/OUTPUT  Intake/Output Summary (Last 24 hours) at 12/23/2019 1356 Last data filed at 12/23/2019 1213 Gross per 24 hour  Intake 145.57 ml  Output 450 ml  Net -304.43 ml    LABS  CBC Recent Labs  Lab 12/22/19 0849 12/23/19 0351  WBC 7.1 4.5  HGB 16.4 14.8  HCT 49.5 43.9  PLT 223 210   Coag's Recent Labs  Lab 12/22/19 0849  APTT 31  INR 1.1   BMET Recent Labs  Lab 12/22/19 0849 12/23/19 0351  NA 134* 131*  K 4.6 5.4*  CL 100 98  CO2 19* 19*  BUN 60* 71*  CREATININE 3.40* 3.59*  GLUCOSE 118* 205*   Electrolytes Recent Labs  Lab 12/22/19 0849 12/23/19 0351  CALCIUM 8.5* 8.0*   Sepsis Markers Recent Labs  Lab 12/22/19 0849 12/22/19 1056 12/22/19 2151 12/23/19 0351  LATICACIDVEN 3.0* 2.7*  --   --   PROCALCITON  --   --  1.22 1.16   ABG Recent Labs  Lab 12/22/19 1239  PHART 7.37  PCO2ART 33  PO2ART 107   Liver Enzymes Recent Labs  Lab  12/22/19 0849 12/23/19 0351  AST 88* 61*  ALT 39 39  ALKPHOS 43 39  BILITOT 1.3* 1.0  ALBUMIN 3.1* 2.6*   Cardiac Enzymes No results for input(s): TROPONINI, PROBNP in the last 168 hours. Glucose Recent Labs  Lab 12/22/19 2105 12/23/19 0746 12/23/19 1113  GLUCAP 203* 236* 220*   CRP13.2 Ferritin 1063 Trop (HS) 38  Neg bilateral LE u/s for DVT  Recent Results (from the past 240 hour(s))  Blood Culture (routine x 2)     Status: None (Preliminary result)   Collection Time: 12/22/19  8:49 AM   Specimen: BLOOD  Result Value Ref Range Status   Specimen Description BLOOD RT [facility]  Final   Special Requests   Final    BOTTLES DRAWN AEROBIC AND ANAEROBIC Blood Culture adequate volume   Culture   Final    NO GROWTH < 24 HOURS Performed at [facility], [address], [place], [place] [address]    Report Status PENDING  Incomplete  Blood Culture (routine x 2)     Status: None (Preliminary result)   Collection Time: 12/22/19  8:49 AM   Specimen: BLOOD  Result Value Ref Range Status   Specimen Description BLOOD RT HAND  Final   Special Requests   Final    BOTTLES DRAWN AEROBIC AND ANAEROBIC Blood Culture adequate  volume   Culture   Final    NO GROWTH < 24 HOURS Performed at [facility], [address] [place], [place] [address]    Report Status PENDING  Incomplete  Urine culture     Status: None   Collection Time: 12/22/19  8:49 AM   Specimen: Urine, Random  Result Value Ref Range Status   Specimen Description   Final    URINE, RANDOM Performed at [facility], [address] [place], [place] [address]    Special Requests   Final    NONE Performed at [facility], [address] [place], [place] [address]    Culture   Final    NO GROWTH Performed at [facility], [address], [place], [place] [address]    Report Status 12/23/2019 FINAL  Final  Respiratory Panel by RT PCR (Flu A&B, Covid) - Nasopharyngeal Swab      Status: Abnormal   Collection Time: 12/22/19  8:49 AM   Specimen: Nasopharyngeal Swab  Result Value Ref Range Status   SARS Coronavirus 2 by RT PCR POSITIVE (A) NEGATIVE Final    Comment: RESULT CALLED TO, READ BACK BY AND VERIFIED WITH: KENDALL FLANNIGAN AT 8250 ON 12/22/2019 [place]. (NOTE) SARS-CoV-2 target nucleic acids are DETECTED.  SARS-CoV-2 RNA is generally detectable in upper respiratory specimens  during the acute phase of infection. Positive results are indicative of the presence of the identified virus, but do not rule out bacterial infection or co-infection with other pathogens not detected by the test. Clinical correlation with patient history and other diagnostic information is necessary to determine patient infection status. The expected result is Negative.  Fact Sheet for Patients:  PinkCheek.be  Fact Sheet for Healthcare Providers: GravelBags.it  This test is not yet approved or cleared by the [place] FDA and  has been authorized for detection and/or diagnosis of SARS-CoV-2 by FDA under an Emergency Use Authorization (EUA).  This EUA will remain in effect (meaning this te st can be used) for the duration of  the COVID-19 declaration under Section 564(b)(1) of the Act, 21 U.S.C. section 360bbb-3(b)(1), unless the authorization is terminated or revoked sooner.      Influenza A by PCR NEGATIVE NEGATIVE Final   Influenza B by PCR NEGATIVE NEGATIVE Final    Comment: (NOTE) The Xpert Xpress SARS-CoV-2/FLU/RSV assay is intended as an aid in  the diagnosis of influenza from Nasopharyngeal swab specimens and  should not be used as a sole basis for treatment. Nasal washings and  aspirates are unacceptable for Xpert Xpress SARS-CoV-2/FLU/RSV  testing.  Fact Sheet for Patients: PinkCheek.be  Fact Sheet for Healthcare Providers: GravelBags.it  This  test is not yet approved or cleared by the [place] FDA and  has been authorized for detection and/or diagnosis of SARS-CoV-2 by  FDA under an Emergency Use Authorization (EUA). This EUA will remain  in effect (meaning this test can be used) for the duration of the  Covid-19 declaration under Section 564(b)(1) of the Act, 21  U.S.C. section 360bbb-3(b)(1), unless the authorization is  terminated or revoked. Performed at [facility], [address] [place], [place] [address]   MRSA PCR Screening     Status: None   Collection Time: 12/22/19  9:16 PM   Specimen: Nasal Mucosa; Nasopharyngeal  Result Value Ref Range Status   MRSA by PCR NEGATIVE NEGATIVE Final    Comment:        The GeneXpert MRSA Assay (  FDA approved for NASAL specimens only), is one component of a comprehensive MRSA colonization surveillance program. It is not intended to diagnose MRSA infection nor to guide or monitor treatment for MRSA infections. Performed at [facility], [address] [place], [place] [address]     MEDICATIONS   Current Facility-Administered Medications:  .  allopurinol (ZYLOPRIM) tablet 100 mg, 100 mg, Oral, Daily, Rosine Door, MD, 100 mg at 12/23/19 0829 .  amiodarone (PACERONE) tablet 200 mg, 200 mg, Oral, Daily, Rosine Door, MD, 200 mg at 12/23/19 0932 .  atorvastatin (LIPITOR) tablet 80 mg, 80 mg, Oral, QHS, Rosine Door, MD, 80 mg at 12/22/19 2227 .  baricitinib (OLUMIANT) tablet 1 mg, 1 mg, Oral, Daily, Rosine Door, MD, 1 mg at 12/23/19 6712 .  carvedilol (COREG) tablet 3.125 mg, 3.125 mg, Oral, BID WC, Rosine Door, MD, 3.125 mg at 12/23/19 0827 .  cefTRIAXone (ROCEPHIN) 2 g in sodium chloride 0.9 % 100 mL IVPB, 2 g, Intravenous, Q24H, Rust-Chester, Huel Cote, NP, Stopped at 12/22/19 2308 .  Chlorhexidine Gluconate Cloth 2 % PADS 6 each, 6 each, Topical, Daily, Blakeney, Dreama Saa, NP .  dexamethasone (DECADRON) injection 6 mg, 6 mg, Intravenous,  Q24H, Rosine Door, MD, 6 mg at 12/23/19 4580 .  docusate sodium (COLACE) capsule 100 mg, 100 mg, Oral, BID PRN, Rosine Door, MD .  heparin ADULT infusion 100 units/mL (25000 units/273mL sodium chloride 0.45%), 1,450 Units/hr, Intravenous, Continuous, [name], RPH, Last Rate: 14.5 mL/hr at 12/23/19 0940, 1,450 Units/hr at 12/23/19 0940 .  HYDROcodone-acetaminophen (NORCO/VICODIN) 5-325 MG per tablet 1 tablet, 1 tablet, Oral, Q6H PRN, Rosine Door, MD, 1 tablet at 12/23/19 0516 .  insulin aspart (novoLOG) injection 0-15 Units, 0-15 Units, Subcutaneous, TID WC, Rust-Chester, Britton L, NP, 5 Units at 12/23/19 1127 .  levothyroxine (SYNTHROID) tablet 100 mcg, 100 mcg, Oral, QAC breakfast, Rosine Door, MD, 100 mcg at 12/23/19 0516 .  polyethylene glycol (MIRALAX / GLYCOLAX) packet 17 g, 17 g, Oral, Daily PRN, Rosine Door, MD .  Margrett Rud remdesivir 200 mg in sodium chloride 0.9% 250 mL IVPB, 200 mg, Intravenous, Once, Stopped at 12/22/19 1529 **FOLLOWED BY** remdesivir 100 mg in sodium chloride 0.9 % 100 mL IVPB, 100 mg, Intravenous, Daily, Rosine Door, MD, Last Rate: 200 mL/hr at 12/23/19 0900, Rate Verify at 12/23/19 0900 .  sodium zirconium cyclosilicate (LOKELMA) packet 10 g, 10 g, Oral, Once, Dallie Piles, RPH   ASSESSMENT AND PLAN SYNOPSIS  1.Severe covid 19 pneumonia with ARDS  rx with steroids,remdesivir and baricitinib( dose adjusted High risk for further progressions Follow inflammatory markers cxr in am  2. Acute hypoxic resp failure  Cont HFNC , pt on 55L flow with 70% fio2 with spo2 in high 90,s Taper fio2  3. HTN cont carvidolol, prn hydralazine  4. Non ischemic CMP stable , cont carvidolol, BNP elevated likely related to CKD/chf EF 40- 45% 11/2016  5. Acute on chronic CKD bun/cr increased 71/3.59  lasix held Renal consulted, renal u/s pending  6. Chronic afib,  apixaban changed to IV heparin drip NSR at present  7. Nutrition apetite  poor  8. Elevated pro cal ? UTI vs superimposed pneumonia , cont ceftriaxone. Check urine   9. Cont statin and levothyroxine   MONITOR FSBS, SSI ASSESS the need for LABS as needed   Critical Care Time devoted to patient care services described in this note is 35 minutes.   Overall, patient is critically ill, prognosis is guarded.  Patient with  Multiorgan failure and at high risk for cardiac arrest and death.   His mother was called but she did not answer the phone.  A brief message was left.  Rosine Door, MD  12/23/2019 1:56 PM [facility]

## 2019-12-24 ENCOUNTER — Inpatient Hospital Stay: Payer: Medicaid Other

## 2019-12-24 DIAGNOSIS — U071 COVID-19: Secondary | ICD-10-CM | POA: Diagnosis not present

## 2019-12-24 DIAGNOSIS — J1282 Pneumonia due to coronavirus disease 2019: Secondary | ICD-10-CM | POA: Diagnosis not present

## 2019-12-24 LAB — RENAL FUNCTION PANEL
Albumin: 2.6 g/dL — ABNORMAL LOW (ref 3.5–5.0)
Anion gap: 12 (ref 5–15)
BUN: 72 mg/dL — ABNORMAL HIGH (ref 6–20)
CO2: 20 mmol/L — ABNORMAL LOW (ref 22–32)
Calcium: 8 mg/dL — ABNORMAL LOW (ref 8.9–10.3)
Chloride: 97 mmol/L — ABNORMAL LOW (ref 98–111)
Creatinine, Ser: 2.8 mg/dL — ABNORMAL HIGH (ref 0.61–1.24)
GFR, Estimated: 26 mL/min — ABNORMAL LOW (ref >60)
Glucose, Bld: 176 mg/dL — ABNORMAL HIGH (ref 70–99)
Phosphorus: 4.2 mg/dL (ref 2.5–4.6)
Potassium: 4.5 mmol/L (ref 3.5–5.1)
Sodium: 129 mmol/L — ABNORMAL LOW (ref 135–145)

## 2019-12-24 LAB — C-REACTIVE PROTEIN: CRP: 8.6 mg/dL — ABNORMAL HIGH (ref <1.0)

## 2019-12-24 LAB — CBC
HCT: 40.9 % (ref 39.0–52.0)
Hemoglobin: 13.7 g/dL (ref 13.0–17.0)
MCH: 28.6 pg (ref 26.0–34.0)
MCHC: 33.5 g/dL (ref 30.0–36.0)
MCV: 85.4 fL (ref 80.0–100.0)
Platelets: 305 10*3/uL (ref 150–400)
RBC: 4.79 MIL/uL (ref 4.22–5.81)
RDW: 13.2 % (ref 11.5–15.5)
WBC: 9.4 10*3/uL (ref 4.0–10.5)
nRBC: 0.2 % (ref 0.0–0.2)

## 2019-12-24 LAB — FERRITIN: Ferritin: 920 ng/mL — ABNORMAL HIGH (ref 24–336)

## 2019-12-24 LAB — GLUCOSE, CAPILLARY
Glucose-Capillary: 171 mg/dL — ABNORMAL HIGH (ref 70–99)
Glucose-Capillary: 148 mg/dL — ABNORMAL HIGH (ref 70–99)
Glucose-Capillary: 166 mg/dL — ABNORMAL HIGH (ref 70–99)
Glucose-Capillary: 192 mg/dL — ABNORMAL HIGH (ref 70–99)

## 2019-12-24 LAB — APTT
aPTT: 101 seconds — ABNORMAL HIGH (ref 24–36)
aPTT: 123 seconds — ABNORMAL HIGH (ref 24–36)

## 2019-12-24 LAB — LACTIC ACID, PLASMA: Lactic Acid, Venous: 1.8 mmol/L (ref 0.5–1.9)

## 2019-12-24 LAB — SODIUM: Sodium: 131 mmol/L — ABNORMAL LOW (ref 135–145)

## 2019-12-24 LAB — PROCALCITONIN: Procalcitonin: 0.99 ng/mL

## 2019-12-24 LAB — HEPARIN LEVEL (UNFRACTIONATED): Heparin Unfractionated: 1.28 IU/mL — ABNORMAL HIGH (ref 0.30–0.70)

## 2019-12-24 LAB — MAGNESIUM: Magnesium: 2.3 mg/dL (ref 1.7–2.4)

## 2019-12-24 LAB — OSMOLALITY: Osmolality: 300 mOsm/kg — ABNORMAL HIGH (ref 275–295)

## 2019-12-24 LAB — STREP PNEUMONIAE URINARY ANTIGEN: Strep Pneumo Urinary Antigen: NEGATIVE

## 2019-12-24 LAB — FIBRIN DERIVATIVES D-DIMER (ARMC ONLY): Fibrin derivatives D-dimer (ARMC): 1940.55 ng/mL (FEU) — ABNORMAL HIGH (ref 0.00–499.00)

## 2019-12-24 IMAGING — DX DG CHEST 1V PORT
1 series · 1 of 1 positions shown · non-contrast
Comparison: [DATE] and earlier studies.

CLINICAL DATA: Follow-up for [S8] pneumonia.

EXAM:
PORTABLE CHEST 1 VIEW

[chest ap]
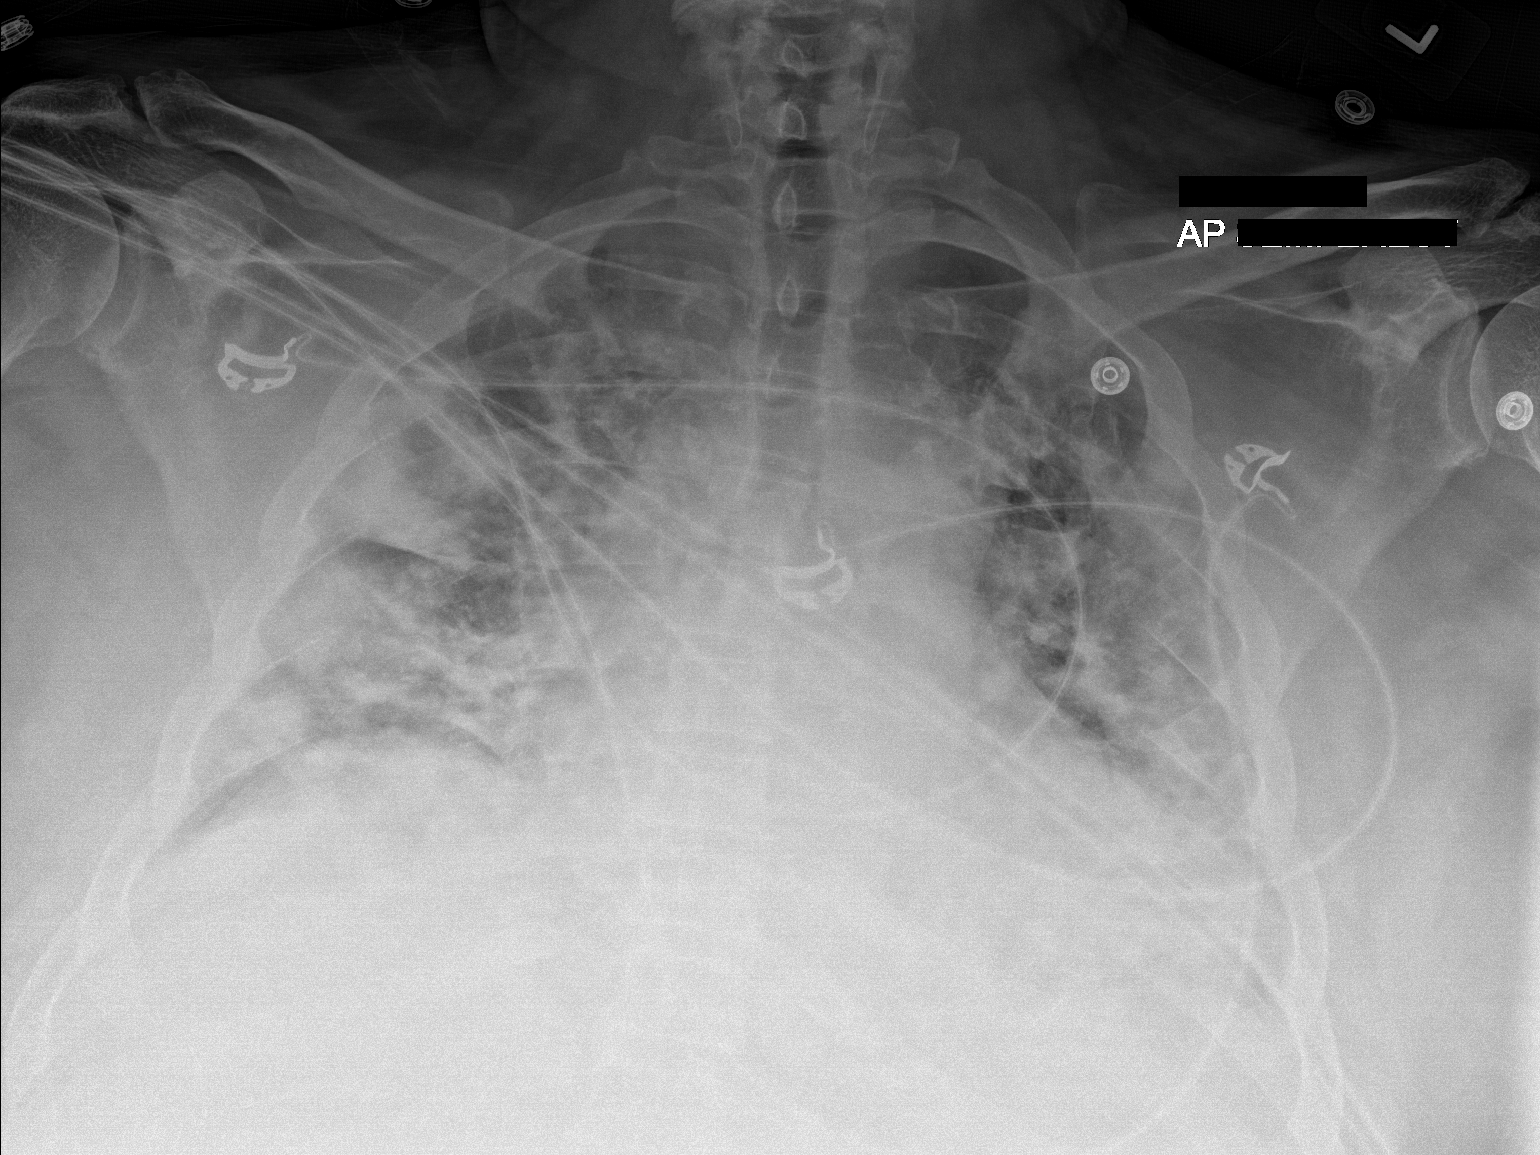

[1 of 1 positions shown; findings below may reference images not displayed]

FINDINGS: Bilateral airspace lung opacities are without significant change
from the most recent prior study, allowing for lower lung volumes on
the current exam. No new lung abnormalities. No convincing pleural
effusion and no pneumothorax.
IMPRESSION: 1. No significant interval change when compared to the most recent
prior study. Bilateral airspace lung opacities consistent with
multifocal pneumonia.

## 2019-12-24 MED ORDER — SODIUM CHLORIDE 0.9 % IV BOLUS: 250.0000 mL | Freq: Once | INTRAVENOUS | Status: DC

## 2019-12-24 MED ORDER — MELATONIN 5 MG PO TABS: 2.5000 mg | ORAL_TABLET | Freq: Every day | ORAL | Status: DC

## 2019-12-24 MED ORDER — HYDRALAZINE HCL 20 MG/ML IJ SOLN
10.0000 mg | Freq: Four times a day (QID) | INTRAMUSCULAR | Status: DC | PRN
  Administered 2019-12-26 – 2019-12-28 (×2): 10 mg via INTRAVENOUS
  Filled 2019-12-24 (×4): qty 1

## 2019-12-24 MED ORDER — SODIUM CHLORIDE 0.9 % IV SOLN: INTRAVENOUS | Status: AC

## 2019-12-24 MED ORDER — TRAZODONE HCL 50 MG PO TABS
50.0000 mg | ORAL_TABLET | Freq: Every day | ORAL | Status: DC
  Administered 2019-12-24 – 2020-01-18 (×26): 50 mg via ORAL
  Filled 2019-12-24 (×27): qty 1

## 2019-12-24 NOTE — Consult Note (Addendum)
ANTICOAGULATION CONSULT NOTE   Pharmacy Consult for Heparin Infusion Indication: atrial fibrillation  Patient Measurements: Height: 5\' 11"  (180.3 cm) Weight: 123.6 kg (272 lb 7.8 oz) IBW/kg (Calculated) : 75.3 Heparin Dosing Weight: 104.8 kg  Labs: Recent Labs    12/22/19 0849 12/22/19 0849 12/23/19 0351 12/23/19 1803 12/24/19 0142  HGB 16.4   < > 14.8  --  13.7  HCT 49.5  --  43.9  --  40.9  PLT 223  --  210  --  305  APTT 31  --   --  81* 101*  LABPROT 13.3  --   --   --   --   INR 1.1  --   --   --   --   HEPARINUNFRC  --   --  1.77*  --   --   CREATININE 3.40*  --  3.59*  --  2.80*   < > = values in this interval not displayed.    Estimated Creatinine Clearance: 39.9 mL/min (A) (by C-G formula based on SCr of 2.8 mg/dL (H)).   Medical History: Past Medical History:  Diagnosis Date  . Alcohol abuse   . 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 12/2007 MV: EF 28%, no isch;  b. 8/12 Echo: EF 25-35%; c. 02/2014 Echo: EF 20-25%; d. 12/2014 Cath: minimal CAD; e. 01/2015 Echo: EF 50-55%;  d. 05/2016 Echo: EF 30-35%, diff HK, gr2 DD; e. 11/2016 Echo: EF 45-50%, diff HK.  . Chronic combined systolic (congestive) and diastolic (congestive) heart failure ([place])    a. 05/2016 Most recent Echo: EF 30-35%, diff HK, Gr2 DD, mild MR, mod dil LA; b. 11/2016 Echo: EF 45-50%, diff HK.  . CKD (chronic kidney disease), stage III ([place])   . Elevated troponin    Chronically elevated. - level was 0.17-0.10 during recent admission  . Essential hypertension   . GI bleed 11/2013  . Hyperlipidemia   . Pancreatitis   . Paroxysmal A-fib ([place])    a. new onset s/p unsuccessful TEE/DCCV on 08/16/2014; b. on amio/eliquis (CHA2DS2VASc = 2-3); c. reports 1-2 hrs of afib ~ q27mos.  . Paroxysmal atrial flutter ([place])    a. new onset 07/2014; b. s/p unsuccessful TEE/DCCV 08/16/2014; c. on apixaban.  . Sleep-disordered breathing    Has yet to have a sleep study    Medications:  Apixaban 5 mg BID for atrial  fibrillation (last dose 10/29 at 1839)  Assessment: Patient is a 54 y/o M with medical history including atrial fibrillation on apixaban, CHF, CKD, alcohol abuse who is admitted with severe COVID-19 pneumonia with ARDS. Admission further complicated by AKI. Pharmacy has been consulted to initiate heparin infusion in this patient due to concerns about clearance of apixaban given renal dysfunction.   Per chart, patient is currently in NSR. Baseline aPTT 31, INR 1.1, anti-Xa 1.77. H&H, platelets within normal limits. D-dimer elevated.   Goal of Therapy:  Heparin level 0.3-0.7 units/ml aPTT 66 - 102 seconds Monitor platelets by anticoagulation protocol: Yes   Plan:  --10/31 at 0142 aPTT = 101s, therapeutic x 2. (HL 1.28, does not correlate)  CBC is stable.  Will continue heparin infusion at 1450 units/hr --Continue to follow aPTT given apixaban interference with anti-Xa level. Can switch over to anti-Xa monitoring when correlation is established --Daily HL, aPTT, CBC per protocol  Hart Robinsons A 12/24/2019,2:38 AM

## 2019-12-24 NOTE — Progress Notes (Signed)
PHARMACY CONSULT NOTE  Pharmacy Consult for Electrolyte Monitoring and Replacement   Recent Labs: Potassium (mmol/L)  Date Value  12/24/2019 4.5  03/08/2014 3.9   Magnesium (mg/dL)  Date Value  12/24/2019 2.3   Calcium (mg/dL)  Date Value  12/24/2019 8.0 (L)   Calcium, Total (mg/dL)  Date Value  03/08/2014 7.9 (L)   Albumin (g/dL)  Date Value  12/24/2019 2.6 (L)  10/11/2018 4.2  03/07/2014 3.4   Phosphorus (mg/dL)  Date Value  12/24/2019 4.2   Sodium (mmol/L)  Date Value  12/24/2019 129 (L)  10/11/2018 141  03/08/2014 135 (L)   Corrected Ca: 9.12 mg/dL  Assessment: 54 y/o M with medical history including atrial fibrillation on apixaban, CHF, CKD, alcohol abuse who is admitted with severe COVID-19 pneumonia with ARDS. Admission further complicated by AKI which has improved slightly since admission.   Goal of Therapy:  Potassium 4.0 - 5.1 mmol/L Magnesium 2.0 - 2.4 mg/dL All Other Electrolytes WNL  Plan:   No electrolyte replacement warranted today  Re-check electrolytes at 1800  Dallie Piles ,PharmD Clinical Pharmacist 12/24/2019 7:06 AM

## 2019-12-24 NOTE — Progress Notes (Signed)
CRITICAL CARE NOTE  CC  follow up respiratory failure  SUBJECTIVE Patient remains critically ill However he is feeling better.  He continues to have dry cough without chest pain hemoptysis of sputum.  No fever chills or rigors.  No abdominal pain nausea or vomiting.  No headaches no seizures.  No rashes or joint pain.  He seems to be in good spirits and had a lot of questions which were answered.  Specifically asked about why he is getting insulin.  He was informed that due to steroids he is hyperglycemic.  His appetite is poor however he asked for ice cream and fruits. Prognosis is guarded     SIGNIFICANT EVENTS  fio2 increassed to 65% for a transient drop in spo2 today  but has been weaned down  to 55% with 50L flow CT head done last night which was negative  BP (!) 129/96   Pulse (!) 57   Temp (!) 97.3 F (36.3 C) (Axillary)   Resp (!) 25   Ht 5\' 11"  (1.803 m)   Wt 123.6 kg   SpO2 97%   BMI 38.00 kg/m    REVIEW OF SYSTEMS AS above PHYSICAL EXAMINATION:  GENERAL:critically ill appearing, no resp distress HEAD: Normocephalic, atraumatic.  EYES: Pupils equal, round, reactive to light.  No scleral icterus.  MOUTH: Moist mucosal membrane. NECK: Supple. No thyromegaly. No nodules. No JVD.  PULMONARY: bilateral few rhonchi, No wheezing CARDIOVASCULAR: S1 and S2. Regular rate and rhythm. No murmurs, rubs, or gallops.  GASTROINTESTINAL: Soft, nontender, not distended. No masses. Positive bowel sounds. No hepatosplenomegaly.  MUSCULOSKELETAL: No swelling, clubbing, or edema.  NEUROLOGIC: awake and alertSKIN:intact,warm,dry  INTAKE/OUTPUT  Intake/Output Summary (Last 24 hours) at 12/24/2019 1547 Last data filed at 12/24/2019 1500 Gross per 24 hour  Intake 1485.43 ml  Output 1675 ml  Net -189.57 ml    LABS  CBC Recent Labs  Lab 12/22/19 0849 12/23/19 0351 12/24/19 0142  WBC 7.1 4.5 9.4  HGB 16.4 14.8 13.7  HCT 49.5 43.9 40.9  PLT 223 210 305   Coag's Recent  Labs  Lab 12/22/19 0849 12/22/19 0849 12/23/19 1803 12/24/19 0142 12/24/19 0449  APTT 31   < > 81* 101* 123*  INR 1.1  --   --   --   --    < > = values in this interval not displayed.   BMET Recent Labs  Lab 12/22/19 0849 12/22/19 0849 12/23/19 0351 12/23/19 1803 12/24/19 0142 12/24/19 0845  NA 134*   < > 131*  --  129* 131*  K 4.6   < > 5.4* 4.8 4.5  --   CL 100  --  98  --  97*  --   CO2 19*  --  19*  --  20*  --   BUN 60*  --  71*  --  72*  --   CREATININE 3.40*  --  3.59*  --  2.80*  --   GLUCOSE 118*  --  205*  --  176*  --    < > = values in this interval not displayed.   Electrolytes Recent Labs  Lab 12/22/19 0849 12/23/19 0351 12/23/19 1803 12/24/19 0142  CALCIUM 8.5* 8.0*  --  8.0*  MG  --   --  2.4 2.3  PHOS  --   --  4.0 4.2   Sepsis Markers Recent Labs  Lab 12/22/19 0849 12/22/19 1056 12/22/19 2151 12/23/19 0351 12/24/19 0142  LATICACIDVEN 3.0* 2.7*  --   --  1.8  PROCALCITON  --   --  1.22 1.16 0.99   ABG Recent Labs  Lab 12/22/19 1239  PHART 7.37  PCO2ART 33  PO2ART 107   Liver Enzymes Recent Labs  Lab 12/22/19 0849 12/23/19 0351 12/24/19 0142  AST 88* 61*  --   ALT 39 39  --   ALKPHOS 43 39  --   BILITOT 1.3* 1.0  --   ALBUMIN 3.1* 2.6* 2.6*   Cardiac Enzymes No results for input(s): TROPONINI, PROBNP in the last 168 hours. Glucose Recent Labs  Lab 12/23/19 0746 12/23/19 1113 12/23/19 1606 12/23/19 2213 12/24/19 0749 12/24/19 1115  GLUCAP 236* 220* 264* 258* 171* 148*     Recent Results (from the past 240 hour(s))  Blood Culture (routine x 2)     Status: None (Preliminary result)   Collection Time: 12/22/19  8:49 AM   Specimen: BLOOD  Result Value Ref Range Status   Specimen Description BLOOD RT [facility]  Final   Special Requests   Final    BOTTLES DRAWN AEROBIC AND ANAEROBIC Blood Culture adequate volume   Culture   Final    NO GROWTH 2 DAYS Performed at [facility], [address] [place],  [place] [address]    Report Status PENDING  Incomplete  Blood Culture (routine x 2)     Status: None (Preliminary result)   Collection Time: 12/22/19  8:49 AM   Specimen: BLOOD  Result Value Ref Range Status   Specimen Description BLOOD RT HAND  Final   Special Requests   Final    BOTTLES DRAWN AEROBIC AND ANAEROBIC Blood Culture adequate volume   Culture   Final    NO GROWTH 2 DAYS Performed at [facility], [address] [place], [place] [address]    Report Status PENDING  Incomplete  Urine culture     Status: None   Collection Time: 12/22/19  8:49 AM   Specimen: Urine, Random  Result Value Ref Range Status   Specimen Description   Final    URINE, RANDOM Performed at [facility], [address] [place], [place] [address]    Special Requests   Final    NONE Performed at [facility], [address], [place], [place] [address]    Culture   Final    NO GROWTH Performed at [facility], [place] [address], [place], [place] [address]    Report Status 12/23/2019 FINAL  Final  Respiratory Panel by RT PCR (Flu A&B, Covid) - Nasopharyngeal Swab     Status: Abnormal   Collection Time: 12/22/19  8:49 AM   Specimen: Nasopharyngeal Swab  Result Value Ref Range Status   SARS Coronavirus 2 by RT PCR POSITIVE (A) NEGATIVE Final    Comment: RESULT CALLED TO, READ BACK BY AND VERIFIED WITH: KENDALL FLANNIGAN AT 6433 ON 12/22/2019 [place]. (NOTE) SARS-CoV-2 target nucleic acids are DETECTED.  SARS-CoV-2 RNA is generally detectable in upper respiratory specimens  during the acute phase of infection. Positive results are indicative of the presence of the identified virus, but do not rule out bacterial infection or co-infection with other pathogens not detected by the test. Clinical correlation with patient history and other diagnostic information is necessary to determine patient infection status. The expected result is Negative.  Fact Sheet for Patients:   PinkCheek.be  Fact Sheet for Healthcare Providers: GravelBags.it  This test is not yet approved or cleared by the [place] FDA and  has been  authorized for detection and/or diagnosis of SARS-CoV-2 by FDA under an Emergency Use Authorization (EUA).  This EUA will remain in effect (meaning this te st can be used) for the duration of  the COVID-19 declaration under Section 564(b)(1) of the Act, 21 U.S.C. section 360bbb-3(b)(1), unless the authorization is terminated or revoked sooner.      Influenza A by PCR NEGATIVE NEGATIVE Final   Influenza B by PCR NEGATIVE NEGATIVE Final    Comment: (NOTE) The Xpert Xpress SARS-CoV-2/FLU/RSV assay is intended as an aid in  the diagnosis of influenza from Nasopharyngeal swab specimens and  should not be used as a sole basis for treatment. Nasal washings and  aspirates are unacceptable for Xpert Xpress SARS-CoV-2/FLU/RSV  testing.  Fact Sheet for Patients: PinkCheek.be  Fact Sheet for Healthcare Providers: GravelBags.it  This test is not yet approved or cleared by the [place] FDA and  has been authorized for detection and/or diagnosis of SARS-CoV-2 by  FDA under an Emergency Use Authorization (EUA). This EUA will remain  in effect (meaning this test can be used) for the duration of the  Covid-19 declaration under Section 564(b)(1) of the Act, 21  U.S.C. section 360bbb-3(b)(1), unless the authorization is  terminated or revoked. Performed at [facility], [address] [place], [place] [address]   MRSA PCR Screening     Status: None   Collection Time: 12/22/19  9:16 PM   Specimen: Nasal Mucosa; Nasopharyngeal  Result Value Ref Range Status   MRSA by PCR NEGATIVE NEGATIVE Final    Comment:        The GeneXpert MRSA Assay (FDA approved for NASAL specimens only), is one component of a comprehensive  MRSA colonization surveillance program. It is not intended to diagnose MRSA infection nor to guide or monitor treatment for MRSA infections. Performed at [facility], [address] [place], [place] [address]     cxr 10/31 as per rad, images reviewed by me also  IMPRESSION: 1. No significant interval change when compared to the most recent prior study. Bilateral airspace lung opacities consistent with multifocal pneumonia. MEDICATIONS   Current Facility-Administered Medications:  .  0.9 %  sodium chloride infusion, , Intravenous, Continuous, Kolluru, Sarath, MD, Last Rate: 75 mL/hr at 12/24/19 1008, New Bag at 12/24/19 1008 .  allopurinol (ZYLOPRIM) tablet 100 mg, 100 mg, Oral, Daily, Rosine Door, MD, 100 mg at 12/24/19 1014 .  amiodarone (PACERONE) tablet 200 mg, 200 mg, Oral, Daily, Rosine Door, MD, 200 mg at 12/24/19 1014 .  atorvastatin (LIPITOR) tablet 80 mg, 80 mg, Oral, QHS, Rosine Door, MD, 80 mg at 12/23/19 2212 .  baricitinib (OLUMIANT) tablet 1 mg, 1 mg, Oral, Daily, Rosine Door, MD, 1 mg at 12/24/19 1014 .  carvedilol (COREG) tablet 3.125 mg, 3.125 mg, Oral, BID WC, Rosine Door, MD, 3.125 mg at 12/24/19 1014 .  cefTRIAXone (ROCEPHIN) 2 g in sodium chloride 0.9 % 100 mL IVPB, 2 g, Intravenous, Q24H, Rust-Chester, Huel Cote, NP, Stopped at 12/23/19 2246 .  Chlorhexidine Gluconate Cloth 2 % PADS 6 each, 6 each, Topical, Daily, Awilda Bill, NP, 6 each at 12/24/19 1100 .  dexamethasone (DECADRON) injection 6 mg, 6 mg, Intravenous, Q24H, Rosine Door, MD, 6 mg at 12/24/19 1015 .  docusate sodium (COLACE) capsule 100 mg, 100 mg, Oral, BID PRN, Rosine Door, MD .  heparin ADULT infusion 100 units/mL (25000 units/268mL sodium chloride 0.45%), 1,450 Units/hr, Intravenous, Continuous, [name], RPH, Last Rate: 14.5 mL/hr at  12/24/19 0312, 1,450 Units/hr at 12/24/19 0312 .  HYDROcodone-acetaminophen (NORCO/VICODIN) 5-325 MG per tablet 1  tablet, 1 tablet, Oral, Q6H PRN, Rosine Door, MD, 1 tablet at 12/23/19 1454 .  insulin aspart (novoLOG) injection 0-20 Units, 0-20 Units, Subcutaneous, TID AC & HS, Rust-Chester, Britton L, NP, 4 Units at 12/24/19 1300 .  levothyroxine (SYNTHROID) tablet 100 mcg, 100 mcg, Oral, QAC breakfast, Rosine Door, MD, 100 mcg at 12/24/19 0458 .  melatonin tablet 2.5 mg, 2.5 mg, Oral, QHS PRN, Rust-Chester, Britton L, NP, 2.5 mg at 12/23/19 2248 .  polyethylene glycol (MIRALAX / GLYCOLAX) packet 17 g, 17 g, Oral, Daily PRN, Rosine Door, MD .  Margrett Rud remdesivir 200 mg in sodium chloride 0.9% 250 mL IVPB, 200 mg, Intravenous, Once, Stopped at 12/22/19 1529 **FOLLOWED BY** remdesivir 100 mg in sodium chloride 0.9 % 100 mL IVPB, 100 mg, Intravenous, Daily, Rosine Door, MD, Last Rate: 200 mL/hr at 12/24/19 1013, 100 mg at 12/24/19 1013         ASSESSMENT AND PLAN SYNOPSIS 1.Severe covid 19 pneumonia with ARDS  rx with steroids,remdesivir and baricitinib( dose adjusted High risk for further progressions Follow inflammatory markers 10/31 ferritin 920, crp 8.6, heparin derivative 1940 Pro cal 0.99, urine strep pneumo negative cxr stable  2. Acute hypoxic resp failure  Cont HFNC ,  Better pt on 55L flow with 55% fio2 with spo2 in high 90,s Taper fio2 as tolerated   3. HTN cont carvidolol, prn hydralazine  4. Non ischemic CMP stable , cont carvidolol, BNP elevated likely related to CKD/chf EF 40- 45% 11/2016   5.Acute  kidney injury  With underlying CKD IIIB with hyperkalemia, metabolic acidosis and hyponatremia . Baseline creatinine of 2.37, GFR of 30 on 02/08/2019.  Renal ultrasound reviewed, consistent with chronic kidney disease.  - holding furosemide, entresto.  Renal fnx improving 72/2.80  - gentle IV fluid: Normal saline at 54mL/hr for 1 day. as per renal   6. Chronic afib,  apixaban changed to IV heparin drip NSR at present  7. Nutrition apetite poor  8.  Elevated pro cal ? UTI vs superimposed pneumonia , cont ceftriaxone.   9. Cont statin and levothyroxine    10. Hyponatremia - Normal saline as above. stable    Critical Care Time devoted to patient care services described in this note is 40 minutes.   Overall, patient is critically ill, prognosis is guarded.  Patient with Multiorgan failure and at high risk for cardiac arrest and death.   D/w staff  Rosine Door, MD  12/24/2019 3:47 PM [facility]

## 2019-12-24 NOTE — Progress Notes (Signed)
[facility]  ROUNDING NOTE   Subjective:   Poor PO intake.  UOP 1923mL Creatinine 2.8 (3.59) Na 129  Objective:  Vital signs in last 24 hours:  Temp:  [95.3 F (35.2 C)-98.3 F (36.8 C)] 97.1 F (36.2 C) (10/31 0500) Pulse Rate:  [49-66] 66 (10/31 0500) Resp:  [16-29] 24 (10/31 0500) BP: (124-155)/(83-103) 131/99 (10/31 0500) SpO2:  [90 %-98 %] 94 % (10/31 0721) FiO2 (%):  [50 %] 50 % (10/31 0721) Weight:  [123.6 kg] 123.6 kg (10/31 0500)  Weight change: -6.129 kg Filed Weights   12/22/19 2100 12/23/19 0500 12/24/19 0500  Weight: 123.6 kg 123.6 kg 123.6 kg    Intake/Output: I/O last 3 completed shifts: In: 1034.3 [P.O.:480; I.V.:254.2; IV Piggyback:300.1] Out: 2150 [Urine:2150]   Intake/Output this shift:  No intake/output data recorded.  Physical Exam: General: Critically ill  Head: Normocephalic, atraumatic. Moist oral mucosal membranes  Eyes: Anicteric, PERRL  Neck: Supple, trachea midline  Lungs:  +coarse breath sounds, HFNC 50%  Heart: Regular rate and rhythm  Abdomen:  Soft, nontender  Extremities: No peripheral edema.  Neurologic: Nonfocal, moving all four extremities  Skin: No lesions        Basic Metabolic Panel: Recent Labs  Lab 12/22/19 0849 12/23/19 0351 12/23/19 1803 12/24/19 0142  NA 134* 131*  --  129*  K 4.6 5.4* 4.8 4.5  CL 100 98  --  97*  CO2 19* 19*  --  20*  GLUCOSE 118* 205*  --  176*  BUN 60* 71*  --  72*  CREATININE 3.40* 3.59*  --  2.80*  CALCIUM 8.5* 8.0*  --  8.0*  MG  --   --  2.4 2.3  PHOS  --   --  4.0 4.2    Liver Function Tests: Recent Labs  Lab 12/22/19 0849 12/23/19 0351 12/24/19 0142  AST 88* 61*  --   ALT 39 39  --   ALKPHOS 43 39  --   BILITOT 1.3* 1.0  --   PROT 7.3 6.6  --   ALBUMIN 3.1* 2.6* 2.6*   No results for input(s): LIPASE, AMYLASE in the last 168 hours. No results for input(s): AMMONIA in the last 168 hours.  CBC: Recent Labs  Lab 12/22/19 0849 12/23/19 0351  12/24/19 0142  WBC 7.1 4.5 9.4  NEUTROABS 5.9 3.5  --   HGB 16.4 14.8 13.7  HCT 49.5 43.9 40.9  MCV 86.7 86.2 85.4  PLT 223 210 305    Cardiac Enzymes: No results for input(s): CKTOTAL, CKMB, CKMBINDEX, TROPONINI in the last 168 hours.  BNP: Invalid input(s): POCBNP  CBG: Recent Labs  Lab 12/23/19 0746 12/23/19 1113 12/23/19 1606 12/23/19 2213 12/24/19 0749  GLUCAP 236* 220* 264* 258* 171*    Microbiology: Results for orders placed or performed during the hospital encounter of 12/22/19  Blood Culture (routine x 2)     Status: None (Preliminary result)   Collection Time: 12/22/19  8:49 AM   Specimen: BLOOD  Result Value Ref Range Status   Specimen Description BLOOD RT [facility]  Final   Special Requests   Final    BOTTLES DRAWN AEROBIC AND ANAEROBIC Blood Culture adequate volume   Culture   Final    NO GROWTH 2 DAYS Performed at [facility], [address] [place], [place] [address]    Report Status PENDING  Incomplete  Blood Culture (routine x 2)     Status: None (Preliminary result)   Collection  Time: 12/22/19  8:49 AM   Specimen: BLOOD  Result Value Ref Range Status   Specimen Description BLOOD RT HAND  Final   Special Requests   Final    BOTTLES DRAWN AEROBIC AND ANAEROBIC Blood Culture adequate volume   Culture   Final    NO GROWTH 2 DAYS Performed at [facility], [address], [place], [place] [address]    Report Status PENDING  Incomplete  Urine culture     Status: None   Collection Time: 12/22/19  8:49 AM   Specimen: Urine, Random  Result Value Ref Range Status   Specimen Description   Final    URINE, RANDOM Performed at [facility], [address] [place], [place] [address]    Special Requests   Final    NONE Performed at [facility], [address] [place], [place] [address]    Culture   Final    NO GROWTH Performed at [facility], [place] [address] [place], [place] [address]    Report Status  12/23/2019 FINAL  Final  Respiratory Panel by RT PCR (Flu A&B, Covid) - Nasopharyngeal Swab     Status: Abnormal   Collection Time: 12/22/19  8:49 AM   Specimen: Nasopharyngeal Swab  Result Value Ref Range Status   SARS Coronavirus 2 by RT PCR POSITIVE (A) NEGATIVE Final    Comment: RESULT CALLED TO, READ BACK BY AND VERIFIED WITH: KENDALL FLANNIGAN AT 6659 ON 12/22/2019 [place]. (NOTE) SARS-CoV-2 target nucleic acids are DETECTED.  SARS-CoV-2 RNA is generally detectable in upper respiratory specimens  during the acute phase of infection. Positive results are indicative of the presence of the identified virus, but do not rule out bacterial infection or co-infection with other pathogens not detected by the test. Clinical correlation with patient history and other diagnostic information is necessary to determine patient infection status. The expected result is Negative.  Fact Sheet for Patients:  PinkCheek.be  Fact Sheet for Healthcare Providers: GravelBags.it  This test is not yet approved or cleared by the [place] FDA and  has been authorized for detection and/or diagnosis of SARS-CoV-2 by FDA under an Emergency Use Authorization (EUA).  This EUA will remain in effect (meaning this te st can be used) for the duration of  the COVID-19 declaration under Section 564(b)(1) of the Act, 21 U.S.C. section 360bbb-3(b)(1), unless the authorization is terminated or revoked sooner.      Influenza A by PCR NEGATIVE NEGATIVE Final   Influenza B by PCR NEGATIVE NEGATIVE Final    Comment: (NOTE) The Xpert Xpress SARS-CoV-2/FLU/RSV assay is intended as an aid in  the diagnosis of influenza from Nasopharyngeal swab specimens and  should not be used as a sole basis for treatment. Nasal washings and  aspirates are unacceptable for Xpert Xpress SARS-CoV-2/FLU/RSV  testing.  Fact Sheet for  Patients: PinkCheek.be  Fact Sheet for Healthcare Providers: GravelBags.it  This test is not yet approved or cleared by the [place] FDA and  has been authorized for detection and/or diagnosis of SARS-CoV-2 by  FDA under an Emergency Use Authorization (EUA). This EUA will remain  in effect (meaning this test can be used) for the duration of the  Covid-19 declaration under Section 564(b)(1) of the Act, 21  U.S.C. section 360bbb-3(b)(1), unless the authorization is  terminated or revoked. Performed at [facility], [address], [place],  [address]   MRSA PCR Screening     Status: None  Collection Time: 12/22/19  9:16 PM   Specimen: Nasal Mucosa; Nasopharyngeal  Result Value Ref Range Status   MRSA by PCR NEGATIVE NEGATIVE Final    Comment:        The GeneXpert MRSA Assay (FDA approved for NASAL specimens only), is one component of a comprehensive MRSA colonization surveillance program. It is not intended to diagnose MRSA infection nor to guide or monitor treatment for MRSA infections. Performed at [facility], [address] [place], [place] [address]     Coagulation Studies: Recent Labs    12/22/19 0849  LABPROT 13.3  INR 1.1    Urinalysis: Recent Labs    12/22/19 0849  COLORURINE AMBER*  LABSPEC 1.017  PHURINE 5.0  GLUCOSEU NEGATIVE  HGBUR LARGE*  BILIRUBINUR NEGATIVE  KETONESUR NEGATIVE  PROTEINUR >=300*  NITRITE NEGATIVE  LEUKOCYTESUR MODERATE*      Imaging: CT HEAD WO CONTRAST  Result Date: 12/23/2019 CLINICAL DATA:  Neurologic deficit. EXAM: CT HEAD WITHOUT CONTRAST TECHNIQUE: Contiguous axial images were obtained from the base of the skull through the vertex without intravenous contrast. COMPARISON:  CT maxillofacial dated 12/09/2005. FINDINGS: Brain: No evidence of acute infarction, hemorrhage, hydrocephalus, extra-axial collection or mass lesion/mass  effect. There is mild cerebral volume loss with associated ex vacuo dilatation. Periventricular white matter hypoattenuation likely represents chronic small vessel ischemic disease. Vascular: There are vascular calcifications in the carotid siphons. Skull: Chronic, healed right facial bone fractures are partially imaged. Negative for acute fracture or focal lesion. Sinuses/Orbits: Calcified, inspissated mucus in the right maxillary sinus is redemonstrated. Other: None. IMPRESSION: No acute intracranial process. Electronically Signed   By: Zerita Boers M.D.   On: 12/23/2019 20:16   US RENAL  Result Date: 12/23/2019 CLINICAL DATA:  Acute renal failure. EXAM: RENAL / URINARY TRACT ULTRASOUND COMPLETE COMPARISON:  None. FINDINGS: Right Kidney: Renal measurements: 12.5 x 5.0 x 5.3 cm = volume: 172.6 mL. Contains multiple cysts. A representative cyst measures 4.3 cm. Diffuse increased cortical echogenicity. Left Kidney: Renal measurements: 12.8 x 6.1 x 5.8 cm = volume: 236.4 mL. Contains multiple cysts. A representative cyst measures 3.1 cm. Diffuse increased cortical echogenicity. Bladder: Appears normal for degree of bladder distention. Other: None. IMPRESSION: 1. Medical renal disease.  Bilateral renal cysts. Electronically Signed   By: Dorise Bullion III M.D   On: 12/23/2019 14:35   US Venous Img Lower Bilateral (DVT)  Result Date: 12/23/2019 CLINICAL DATA:  54 year old with lower extremity pain and edema. EXAM: BILATERAL LOWER EXTREMITY VENOUS DOPPLER ULTRASOUND TECHNIQUE: Gray-scale sonography with graded compression, as well as color Doppler and duplex ultrasound were performed to evaluate the lower extremity deep venous systems from the level of the common femoral vein and including the common femoral, femoral, profunda femoral, popliteal and calf veins including the posterior tibial, peroneal and gastrocnemius veins when visible. The superficial great saphenous vein was also interrogated. Spectral  Doppler was utilized to evaluate flow at rest and with distal augmentation maneuvers in the common femoral, femoral and popliteal veins. COMPARISON:  None. FINDINGS: RIGHT LOWER EXTREMITY Common Femoral Vein: No evidence of thrombus. Normal compressibility, respiratory phasicity and response to augmentation. Saphenofemoral Junction: No evidence of thrombus. Normal compressibility and flow on color Doppler imaging. Profunda Femoral Vein: No evidence of thrombus. Normal compressibility and flow on color Doppler imaging. Femoral Vein: No evidence of thrombus. Normal compressibility, respiratory phasicity and response to augmentation. Popliteal Vein: No evidence of thrombus. Normal compressibility, respiratory phasicity and response to augmentation. Calf Veins: No evidence  of thrombus. Normal compressibility and flow on color Doppler imaging. Other Findings:  None. LEFT LOWER EXTREMITY Common Femoral Vein: No evidence of thrombus. Normal compressibility, respiratory phasicity and response to augmentation. Saphenofemoral Junction: No evidence of thrombus. Normal compressibility and flow on color Doppler imaging. Profunda Femoral Vein: No evidence of thrombus. Normal compressibility and flow on color Doppler imaging. Femoral Vein: No evidence of thrombus. Normal compressibility, respiratory phasicity and response to augmentation. Popliteal Vein: No evidence of thrombus. Normal compressibility, respiratory phasicity and response to augmentation. Calf Veins: No evidence of thrombus. Normal compressibility and flow on color Doppler imaging. Other Findings:  None. IMPRESSION: No evidence of deep venous thrombosis in either lower extremity. Electronically Signed   By: Markus Daft M.D.   On: 12/23/2019 12:58   DG Chest Port 1 View  Result Date: 12/24/2019 CLINICAL DATA:  Follow-up for COVID-19 pneumonia. EXAM: PORTABLE CHEST 1 VIEW COMPARISON:  12/22/2019 and earlier studies. FINDINGS: Bilateral airspace lung opacities are  without significant change from the most recent prior study, allowing for lower lung volumes on the current exam. No new lung abnormalities. No convincing pleural effusion and no pneumothorax. IMPRESSION: 1. No significant interval change when compared to the most recent prior study. Bilateral airspace lung opacities consistent with multifocal pneumonia. Electronically Signed   By: Lajean Manes M.D.   On: 12/24/2019 09:03     Medications:   . cefTRIAXone (ROCEPHIN)  IV Stopped (12/23/19 2246)  . heparin 1,450 Units/hr (12/24/19 0312)  . remdesivir 100 mg in NS 100 mL Stopped (12/23/19 0916)   . allopurinol  100 mg Oral Daily  . amiodarone  200 mg Oral Daily  . atorvastatin  80 mg Oral QHS  . baricitinib  1 mg Oral Daily  . carvedilol  3.125 mg Oral BID WC  . Chlorhexidine Gluconate Cloth  6 each Topical Daily  . dexamethasone (DECADRON) injection  6 mg Intravenous Q24H  . insulin aspart  0-20 Units Subcutaneous TID AC & HS  . levothyroxine  100 mcg Oral QAC breakfast   docusate sodium, HYDROcodone-acetaminophen, melatonin, polyethylene glycol  Assessment/ Plan:  [name] is a 54 y.o. black male with atrial fibrillation, hyperlipidemia, congestive heart failure, alcohol abuse, hypothyroidism who was admitted to [facility] on 12/22/2019 for Hypoxia [R09.02] Pneumonia due to COVID-19 virus [U07.1, J12.82] COVID-19 [U07.1]   1. Acute kidney injury with hyperkalemia, metabolic acidosis and hyponatremia on chronic kidney disease stage IIIB. Baseline creatinine of 2.37, GFR of 30 on 02/08/2019. Followed by [facility].  Renal ultrasound reviewed, consistent with chronic kidney disease.  - holding furosemide, entresto.  - Lokelma - gentle IV fluid: Normal saline at 42mL/hr for 1 day.   2. Hypertension: with urgency on admission. Current regimen of carvedilol.  Elevated at 130/88  3. Hyponatremia - Normal saline as above.    LOS: 2 Teddy Pena 10/31/20219:07 AM

## 2019-12-24 NOTE — Progress Notes (Addendum)
Inpatient Diabetes Program Recommendations  AACE/ADA: New Consensus Statement on Inpatient Glycemic Control   Target Ranges:  Prepandial:   less than 140 mg/dL      Peak postprandial:   less than 180 mg/dL (1-2 hours)      Critically ill patients:  140 - 180 mg/dL   Results for BREYLON, SHERROW (MRN [id number]) as of 12/24/2019 08:40  Ref. Range 12/23/2019 07:46 12/23/2019 11:13 12/23/2019 16:06 12/23/2019 22:13 12/24/2019 07:49  Glucose-Capillary Latest Ref Range: 70 - 99 mg/dL 236 (H) 220 (H) 264 (H) 258 (H) 171 (H)    Current insulin orders: Novolog 0-20 units AC&HS; Decadron 6 mg Q24H  Inpatient Diabetes Program Recommendations:    Insulin: If steroids continued, please consider ordering Lantus 10 units QD and Novolog 4 units TID with meals for meal coverage if patient eats at least 50% of meals.  Thanks, Barnie Alderman, RN, MSN, CDE Diabetes Coordinator Inpatient Diabetes Program [phone] (Team Pager from 8am to 5pm)

## 2019-12-25 DIAGNOSIS — I5032 Chronic diastolic (congestive) heart failure: Secondary | ICD-10-CM

## 2019-12-25 DIAGNOSIS — J9601 Acute respiratory failure with hypoxia: Secondary | ICD-10-CM | POA: Diagnosis not present

## 2019-12-25 DIAGNOSIS — J1282 Pneumonia due to coronavirus disease 2019: Secondary | ICD-10-CM | POA: Diagnosis not present

## 2019-12-25 DIAGNOSIS — U071 COVID-19: Secondary | ICD-10-CM | POA: Diagnosis not present

## 2019-12-25 LAB — CBC
HCT: 40.8 % (ref 39.0–52.0)
Hemoglobin: 14 g/dL (ref 13.0–17.0)
MCH: 29 pg (ref 26.0–34.0)
MCHC: 34.3 g/dL (ref 30.0–36.0)
MCV: 84.5 fL (ref 80.0–100.0)
Platelets: 328 10*3/uL (ref 150–400)
RBC: 4.83 MIL/uL (ref 4.22–5.81)
RDW: 13.5 % (ref 11.5–15.5)
WBC: 9.8 10*3/uL (ref 4.0–10.5)
nRBC: 0 % (ref 0.0–0.2)

## 2019-12-25 LAB — RENAL FUNCTION PANEL
Albumin: 2.6 g/dL — ABNORMAL LOW (ref 3.5–5.0)
Anion gap: 10 (ref 5–15)
BUN: 66 mg/dL — ABNORMAL HIGH (ref 6–20)
CO2: 20 mmol/L — ABNORMAL LOW (ref 22–32)
Calcium: 7.8 mg/dL — ABNORMAL LOW (ref 8.9–10.3)
Chloride: 103 mmol/L (ref 98–111)
Creatinine, Ser: 2.49 mg/dL — ABNORMAL HIGH (ref 0.61–1.24)
GFR, Estimated: 30 mL/min — ABNORMAL LOW (ref >60)
Glucose, Bld: 136 mg/dL — ABNORMAL HIGH (ref 70–99)
Phosphorus: 3.8 mg/dL (ref 2.5–4.6)
Potassium: 4.8 mmol/L (ref 3.5–5.1)
Sodium: 133 mmol/L — ABNORMAL LOW (ref 135–145)

## 2019-12-25 LAB — GLUCOSE, CAPILLARY
Glucose-Capillary: 180 mg/dL — ABNORMAL HIGH (ref 70–99)
Glucose-Capillary: 155 mg/dL — ABNORMAL HIGH (ref 70–99)
Glucose-Capillary: 174 mg/dL — ABNORMAL HIGH (ref 70–99)
Glucose-Capillary: 188 mg/dL — ABNORMAL HIGH (ref 70–99)

## 2019-12-25 LAB — APTT: aPTT: 121 seconds — ABNORMAL HIGH (ref 24–36)

## 2019-12-25 LAB — MAGNESIUM: Magnesium: 2.5 mg/dL — ABNORMAL HIGH (ref 1.7–2.4)

## 2019-12-25 LAB — FERRITIN: Ferritin: 1064 ng/mL — ABNORMAL HIGH (ref 24–336)

## 2019-12-25 LAB — HEPARIN LEVEL (UNFRACTIONATED): Heparin Unfractionated: 0.99 IU/mL — ABNORMAL HIGH (ref 0.30–0.70)

## 2019-12-25 LAB — C-REACTIVE PROTEIN: CRP: 4.1 mg/dL — ABNORMAL HIGH (ref <1.0)

## 2019-12-25 LAB — FIBRIN DERIVATIVES D-DIMER (ARMC ONLY): Fibrin derivatives D-dimer (ARMC): 1193.28 ng/mL (FEU) — ABNORMAL HIGH (ref 0.00–499.00)

## 2019-12-25 MED ORDER — SODIUM CHLORIDE 0.9 % IV SOLN: INTRAVENOUS | Status: DC

## 2019-12-25 MED ORDER — HEPARIN (PORCINE) 25000 UT/250ML-% IV SOLN
900.0000 [IU]/h | INTRAVENOUS | Status: AC
  Administered 2019-12-25: 1200 [IU]/h via INTRAVENOUS
  Administered 2019-12-25: 1000 [IU]/h via INTRAVENOUS
  Filled 2019-12-25: qty 250

## 2019-12-25 MED ORDER — BARICITINIB 2 MG PO TABS
2.0000 mg | ORAL_TABLET | Freq: Every day | ORAL | Status: DC
  Administered 2019-12-25: 2 mg via ORAL
  Filled 2019-12-25: qty 1

## 2019-12-25 MED ORDER — FAMOTIDINE IN NACL 20-0.9 MG/50ML-% IV SOLN
20.0000 mg | Freq: Two times a day (BID) | INTRAVENOUS | Status: DC
  Administered 2019-12-25 – 2019-12-26 (×3): 20 mg via INTRAVENOUS
  Filled 2019-12-25 (×3): qty 50

## 2019-12-25 NOTE — Progress Notes (Signed)
Inpatient Diabetes Program Recommendations  AACE/ADA: New Consensus Statement on Inpatient Glycemic Control   Target Ranges:  Prepandial:   less than 140 mg/dL      Peak postprandial:   less than 180 mg/dL (1-2 hours)      Critically ill patients:  140 - 180 mg/dL   Results for Hector [name], Hector [name] (MRN [id number]) as of 12/25/2019 08:53  Ref. Range 12/24/2019 07:49 12/24/2019 11:15 12/24/2019 15:54 12/24/2019 21:12 12/25/2019 07:46  Glucose-Capillary Latest Ref Range: 70 - 99 mg/dL 171 (H) 148 (H) 166 (H) 192 (H) 180 (H)  Results for Hector [name], Hector [name] (MRN [id number]) as of 12/25/2019 08:53  Ref. Range 12/23/2019 03:51  Hemoglobin A1C Latest Ref Range: 4.8 - 5.6 % 7.7 (H)   Review of Glycemic Control  Diabetes history: NO Outpatient Diabetes medications: NA Current orders for Inpatient glycemic control: Novolog 0-20 units AC&HS; Decadron 6 mg Q24H  Inpatient Diabetes Program Recommendations:    Insulin: If steroids are continued, please consider ordering Novolog 3 units TID with meals for meal coverage if patient eats at least 50% of meals.  HbgA1C: A1C 7.7% on 12/23/19 indicating an average glucose of 174 mg/dl over the past 2-3 months.  NOTE: In reviewing the chart, noted patient does not have DM hx. Patient went to Emergency department on 10/30/19 for gouty arthritis and was prescribed 10 day Prednisone taper and was to follow up with PCP. Anticipate recently steroids have impacted A1C results. Would recommend patient follow up with PCP regarding glycemic control and to have A1C repeated in 3-4 months.    Thanks, Barnie Alderman, RN, MSN, CDE Diabetes Coordinator Inpatient Diabetes Program [phone] (Team Pager from 8am to 5pm)

## 2019-12-25 NOTE — Progress Notes (Signed)
AM rounds for patient. Pt overall alert and awake, appetite fair and stable VS. Also discussed patient's complaint of numbness/tingling on left side. Dr.Kasa to decide about MRI vs CTA head/spine for evaluation. Pt reports left sided numbness of left arm ongoing for several weeks, numbness of left leg since around the beginning of this admission. Pt has strong motor response of both legs but some decreased strength and mobility of right hand/grip.

## 2019-12-25 NOTE — Consult Note (Signed)
ANTICOAGULATION CONSULT NOTE   Pharmacy Consult for Heparin Infusion Indication: atrial fibrillation  Patient Measurements: Height: 5\' 11"  (180.3 cm) Weight: 123.6 kg (272 lb 7.8 oz) IBW/kg (Calculated) : 75.3 Heparin Dosing Weight: 104.8 kg  Labs: Recent Labs    12/22/19 0849 12/22/19 0849 12/23/19 0351 12/23/19 0351 12/23/19 1803 12/24/19 0142 12/24/19 0449 12/25/19 0529  HGB 16.4   < > 14.8   < >  --  13.7  --  14.0  HCT 49.5   < > 43.9  --   --  40.9  --  40.8  PLT 223   < > 210  --   --  305  --  328  APTT 31   < >  --   --  81* 101* 123*  --   LABPROT 13.3  --   --   --   --   --   --   --   INR 1.1  --   --   --   --   --   --   --   HEPARINUNFRC  --   --  1.77*  --   --  1.28*  --  0.99*  CREATININE 3.40*   < > 3.59*  --   --  2.80*  --  2.49*   < > = values in this interval not displayed.    Estimated Creatinine Clearance: 44.9 mL/min (A) (by C-G formula based on SCr of 2.49 mg/dL (H)).   Medical History: Past Medical History:  Diagnosis Date  . Alcohol abuse   . 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 12/2007 MV: EF 28%, no isch;  b. 8/12 Echo: EF 25-35%; c. 02/2014 Echo: EF 20-25%; d. 12/2014 Cath: minimal CAD; e. 01/2015 Echo: EF 50-55%;  d. 05/2016 Echo: EF 30-35%, diff HK, gr2 DD; e. 11/2016 Echo: EF 45-50%, diff HK.  . Chronic combined systolic (congestive) and diastolic (congestive) heart failure ([place])    a. 05/2016 Most recent Echo: EF 30-35%, diff HK, Gr2 DD, mild MR, mod dil LA; b. 11/2016 Echo: EF 45-50%, diff HK.  . CKD (chronic kidney disease), stage III ([place])   . Elevated troponin    Chronically elevated. - level was 0.17-0.10 during recent admission  . Essential hypertension   . GI bleed 11/2013  . Hyperlipidemia   . Pancreatitis   . Paroxysmal A-fib ([place])    a. new onset s/p unsuccessful TEE/DCCV on 08/16/2014; b. on amio/eliquis (CHA2DS2VASc = 2-3); c. reports 1-2 hrs of afib ~ q43mos.  . Paroxysmal atrial flutter ([place])    a. new onset  07/2014; b. s/p unsuccessful TEE/DCCV 08/16/2014; c. on apixaban.  . Sleep-disordered breathing    Has yet to have a sleep study    Medications:  Apixaban 5 mg BID for atrial fibrillation (last dose 10/29 at 1839)  Assessment: Patient is a 54 y/o M with medical history including atrial fibrillation on apixaban, CHF, CKD, alcohol abuse who is admitted with severe COVID-19 pneumonia with ARDS. Admission further complicated by AKI. Pharmacy has been consulted to initiate heparin infusion in this patient due to concerns about clearance of apixaban given renal dysfunction.   Per chart, patient is currently in NSR. Baseline aPTT 31, INR 1.1, anti-Xa 1.77. H&H, platelets within normal limits. D-dimer elevated.   Goal of Therapy:  Heparin level 0.3-0.7 units/ml aPTT 66 - 102 seconds Monitor platelets by anticoagulation protocol: Yes   Plan:  --11/01 at 0529 aPTT = 0.99s, SUPRAtherapeutic. (HL pending) CBC  is stable.  Will hold heparin x 1 hour then restart at 1200 units/hr, recheck aPTT in 6 hrs --Continue to follow aPTT given apixaban interference with anti-Xa level. Can switch over to anti-Xa monitoring when correlation is established --Daily HL, aPTT, CBC per protocol  Hart Robinsons A 12/25/2019,7:06 AM

## 2019-12-25 NOTE — Progress Notes (Signed)
CRITICAL CARE NOTE  CC  follow up respiratory failure  SUBJECTIVE Remains critically ill Severe hypoxia Patient refuses MRI/CT scans wokr up for LEFT ARM NUMBNESS    Review of Systems:  Gen:  Denies  fever, sweats, chills weight loss  HEENT: Denies blurred vision, double vision, ear pain, eye pain, hearing loss, nose bleeds, sore throat Cardiac:  No dizziness, chest pain or heaviness, chest tightness,edema, No JVD Resp:  +cough, -sputum production, +shortness of breath, -hemoptysis,  Gi: Denies swallowing difficulty, stomach pain, nausea or vomiting, diarrhea, constipation, bowel incontinence Other:  All other systems negative    BP (!) 142/97   Pulse 63   Temp 97.6 F (36.4 C) (Oral)   Resp (!) 21   Ht 5\' 11"  (1.803 m)   Wt 123.6 kg   SpO2 95%   BMI 38.00 kg/m   Physical Examination:   General Appearance: + distress  Neuro:without focal findings,  speech normal,  HEENT: PERRLA, EOM intact.   Pulmonary: normal breath sounds, No wheezing.  CardiovascularNormal S1,S2.  No m/r/g.   Extremities:Left hand palsy,  PSYCHIATRIC: Mood, affect within normal limits.  [facility]INT REFUSES WORK UP AT THIS TIME I RECOMMEND CT SCANS AND MRI AND PATIENT REFUSED WORK UP    Intake/Output Summary (Last 24 hours) at 12/25/2019 1527 Last data filed at 12/25/2019 1115 Gross per 24 hour  Intake 1809.65 ml  Output 1500 ml  Net 309.65 ml    LABS  CBC Recent Labs  Lab 12/23/19 0351 12/24/19 0142 12/25/19 0529  WBC 4.5 9.4 9.8  HGB 14.8 13.7 14.0  HCT 43.9 40.9 40.8  PLT 210 305 328   Coag's Recent Labs  Lab 12/22/19 0849 12/22/19 0849 12/23/19 1803 12/24/19 0142 12/24/19 0449  APTT 31   < > 81* 101* 123*  INR 1.1  --   --   --   --    < > = values in this interval not displayed.   BMET Recent Labs  Lab 12/23/19 0351 12/23/19 0351 12/23/19 1803 12/24/19 0142 12/24/19 0845 12/25/19 0529  NA 131*   < >  --  129* 131* 133*  K 5.4*   < > 4.8 4.5  --  4.8  CL 98   --   --  97*  --  103  CO2 19*  --   --  20*  --  20*  BUN 71*  --   --  72*  --  66*  CREATININE 3.59*  --   --  2.80*  --  2.49*  GLUCOSE 205*  --   --  176*  --  136*   < > = values in this interval not displayed.   Electrolytes Recent Labs  Lab 12/23/19 0351 12/23/19 1803 12/24/19 0142 12/25/19 0529  CALCIUM 8.0*  --  8.0* 7.8*  MG  --  2.4 2.3 2.5*  PHOS  --  4.0 4.2 3.8   Sepsis Markers Recent Labs  Lab 12/22/19 0849 12/22/19 1056 12/22/19 2151 12/23/19 0351 12/24/19 0142  LATICACIDVEN 3.0* 2.7*  --   --  1.8  PROCALCITON  --   --  1.22 1.16 0.99   ABG Recent Labs  Lab 12/22/19 1239  PHART 7.37  PCO2ART 33  PO2ART 107   Liver Enzymes Recent Labs  Lab 12/22/19 0849 12/22/19 0849 12/23/19 0351 12/24/19 0142 12/25/19 0529  AST 88*  --  61*  --   --   ALT 39  --  39  --   --  ALKPHOS 43  --  39  --   --   BILITOT 1.3*  --  1.0  --   --   ALBUMIN 3.1*   < > 2.6* 2.6* 2.6*   < > = values in this interval not displayed.   Cardiac Enzymes No results for input(s): TROPONINI, PROBNP in the last 168 hours. Glucose Recent Labs  Lab 12/24/19 0749 12/24/19 1115 12/24/19 1554 12/24/19 2112 12/25/19 0746 12/25/19 1109  GLUCAP 171* 148* 166* 192* 180* 155*     Recent Results (from the past 240 hour(s))  Blood Culture (routine x 2)     Status: None (Preliminary result)   Collection Time: 12/22/19  8:49 AM   Specimen: BLOOD  Result Value Ref Range Status   Specimen Description BLOOD RT [facility]  Final   Special Requests   Final    BOTTLES DRAWN AEROBIC AND ANAEROBIC Blood Culture adequate volume   Culture   Final    NO GROWTH 3 DAYS Performed at [facility], [address] [place], [place] [address]    Report Status PENDING  Incomplete  Blood Culture (routine x 2)     Status: None (Preliminary result)   Collection Time: 12/22/19  8:49 AM   Specimen: BLOOD  Result Value Ref Range Status   Specimen Description BLOOD RT HAND  Final   Special  Requests   Final    BOTTLES DRAWN AEROBIC AND ANAEROBIC Blood Culture adequate volume   Culture   Final    NO GROWTH 3 DAYS Performed at [facility], [address] [place], [place] [address]    Report Status PENDING  Incomplete  Urine culture     Status: None   Collection Time: 12/22/19  8:49 AM   Specimen: Urine, Random  Result Value Ref Range Status   Specimen Description   Final    URINE, RANDOM Performed at [facility], [address], [place], [place] [address]    Special Requests   Final    NONE Performed at [facility], [address], [place], [place] [address]    Culture   Final    NO GROWTH Performed at [facility], [place] [address], [place], [place] [address]    Report Status 12/23/2019 FINAL  Final  Respiratory Panel by RT PCR (Flu A&B, Covid) - Nasopharyngeal Swab     Status: Abnormal   Collection Time: 12/22/19  8:49 AM   Specimen: Nasopharyngeal Swab  Result Value Ref Range Status   SARS Coronavirus 2 by RT PCR POSITIVE (A) NEGATIVE Final    Comment: RESULT CALLED TO, READ BACK BY AND VERIFIED WITH: KENDALL FLANNIGAN AT 8676 ON 12/22/2019 [place]. (NOTE) SARS-CoV-2 target nucleic acids are DETECTED.  SARS-CoV-2 RNA is generally detectable in upper respiratory specimens  during the acute phase of infection. Positive results are indicative of the presence of the identified virus, but do not rule out bacterial infection or co-infection with other pathogens not detected by the test. Clinical correlation with patient history and other diagnostic information is necessary to determine patient infection status. The expected result is Negative.  Fact Sheet for Patients:  PinkCheek.be  Fact Sheet for Healthcare Providers: GravelBags.it  This test is not yet approved or cleared by the [place] FDA and  has been authorized for detection and/or diagnosis of SARS-CoV-2  by FDA under an Emergency Use Authorization (EUA).  This EUA will remain in effect (meaning this te st can be used) for the  duration of  the COVID-19 declaration under Section 564(b)(1) of the Act, 21 U.S.C. section 360bbb-3(b)(1), unless the authorization is terminated or revoked sooner.      Influenza A by PCR NEGATIVE NEGATIVE Final   Influenza B by PCR NEGATIVE NEGATIVE Final    Comment: (NOTE) The Xpert Xpress SARS-CoV-2/FLU/RSV assay is intended as an aid in  the diagnosis of influenza from Nasopharyngeal swab specimens and  should not be used as a sole basis for treatment. Nasal washings and  aspirates are unacceptable for Xpert Xpress SARS-CoV-2/FLU/RSV  testing.  Fact Sheet for Patients: PinkCheek.be  Fact Sheet for Healthcare Providers: GravelBags.it  This test is not yet approved or cleared by the [place] FDA and  has been authorized for detection and/or diagnosis of SARS-CoV-2 by  FDA under an Emergency Use Authorization (EUA). This EUA will remain  in effect (meaning this test can be used) for the duration of the  Covid-19 declaration under Section 564(b)(1) of the Act, 21  U.S.C. section 360bbb-3(b)(1), unless the authorization is  terminated or revoked. Performed at [facility], [address] [place], [place] [address]   MRSA PCR Screening     Status: None   Collection Time: 12/22/19  9:16 PM   Specimen: Nasal Mucosa; Nasopharyngeal  Result Value Ref Range Status   MRSA by PCR NEGATIVE NEGATIVE Final    Comment:        The GeneXpert MRSA Assay (FDA approved for NASAL specimens only), is one component of a comprehensive MRSA colonization surveillance program. It is not intended to diagnose MRSA infection nor to guide or monitor treatment for MRSA infections. Performed at [facility], [address] [place], [place] [address]     cxr 10/31 as per rad, images  reviewed by me also  IMPRESSION: 1. No significant interval change when compared to the most recent prior study. Bilateral airspace lung opacities consistent with multifocal pneumonia. MEDICATIONS   Current Facility-Administered Medications:  .  0.9 %  sodium chloride infusion, , Intravenous, Continuous, Kolluru, Sarath, MD, Last Rate: 75 mL/hr at 12/25/19 1213, New Bag at 12/25/19 1213 .  allopurinol (ZYLOPRIM) tablet 100 mg, 100 mg, Oral, Daily, Rosine Door, MD, 100 mg at 12/25/19 0945 .  amiodarone (PACERONE) tablet 200 mg, 200 mg, Oral, Daily, Rosine Door, MD, 200 mg at 12/25/19 0945 .  atorvastatin (LIPITOR) tablet 80 mg, 80 mg, Oral, QHS, Rosine Door, MD, 80 mg at 12/24/19 2113 .  carvedilol (COREG) tablet 3.125 mg, 3.125 mg, Oral, BID WC, Rosine Door, MD, 3.125 mg at 12/25/19 0946 .  Chlorhexidine Gluconate Cloth 2 % PADS 6 each, 6 each, Topical, Daily, Awilda Bill, NP, 6 each at 12/24/19 1100 .  dexamethasone (DECADRON) injection 6 mg, 6 mg, Intravenous, Q24H, Rosine Door, MD, 6 mg at 12/25/19 0946 .  docusate sodium (COLACE) capsule 100 mg, 100 mg, Oral, BID PRN, Rosine Door, MD .  famotidine (PEPCID) IVPB 20 mg premix, 20 mg, Intravenous, Q12H, Dallie Piles, RPH, Last Rate: 100 mL/hr at 12/25/19 1216, 20 mg at 12/25/19 1216 .  heparin ADULT infusion 100 units/mL (25000 units/267mL sodium chloride 0.45%), 1,200 Units/hr, Intravenous, Continuous, Hall, Scott A, RPH, Last Rate: 12 mL/hr at 12/25/19 0900, 1,200 Units/hr at 12/25/19 0900 .  hydrALAZINE (APRESOLINE) injection 10 mg, 10 mg, Intravenous, Q6H PRN, Rust-Chester, Britton L, NP .  HYDROcodone-acetaminophen (NORCO/VICODIN) 5-325 MG per tablet 1 tablet, 1 tablet, Oral, Q6H PRN, Rosine Door, MD, 1 tablet at 12/25/19 0901 .  insulin aspart (novoLOG) injection 0-20 Units, 0-20 Units, Subcutaneous, TID AC & HS, Rust-Chester, Britton L, NP, 4 Units at 12/25/19 1213 .  levothyroxine (SYNTHROID) tablet 100  mcg, 100 mcg, Oral, QAC breakfast, Rosine Door, MD, 100 mcg at 12/25/19 0946 .  polyethylene glycol (MIRALAX / GLYCOLAX) packet 17 g, 17 g, Oral, Daily PRN, Rosine Door, MD .  Margrett Rud remdesivir 200 mg in sodium chloride 0.9% 250 mL IVPB, 200 mg, Intravenous, Once, Stopped at 12/22/19 1529 **FOLLOWED BY** remdesivir 100 mg in sodium chloride 0.9 % 100 mL IVPB, 100 mg, Intravenous, Daily, Rosine Door, MD, Last Rate: 200 mL/hr at 12/25/19 0954, 100 mg at 12/25/19 0954 .  traZODone (DESYREL) tablet 50 mg, 50 mg, Oral, QHS, Rust-Chester, Toribio Harbour L, NP, 50 mg at 12/24/19 2113         ASSESSMENT AND PLAN SYNOPSIS   Severe covid 19 pneumonia with ARDS  rx with steroids,remdesivir  High risk for further progression   Severe ACUTE Hypoxic Failure Cont HFNC ,  Better pt on 55L flow with 55% fio2 with spo2 in high 90,s Wean fio2 as tolerated  ACUTE SYSTOLIC CARDIAC FAILURE- EF 40% -oxygen as needed -Lasix as tolerated   CKD stage 3 KIDNEY INJURY/Renal Failure -continue Foley Catheter-assess need -Avoid nephrotoxic agents -Follow urine output, BMP -Ensure adequate renal perfusion, optimize oxygenation -Renal dose medications   Critical Care Time devoted to patient care services described in this note is 32 minutes.   Overall, patient is critically ill, prognosis is guarded  Corrin Parker, M.D.  [facility]  Medical Director [place] Director [facility] Cardio-Pulmonary Department

## 2019-12-25 NOTE — Progress Notes (Signed)
[facility]  ROUNDING NOTE   Subjective:   More alert and oriented. UOP 1541mL.  Creatinine 2.49 (2.8) Na 133 (131)  Objective:  Vital signs in last 24 hours:  Temp:  [97 F (36.1 C)-97.5 F (36.4 C)] 97.3 F (36.3 C) (11/01 0800) Pulse Rate:  [43-90] 58 (11/01 1100) Resp:  [16-32] 32 (11/01 1100) BP: (129-175)/(91-156) 136/98 (11/01 1100) SpO2:  [86 %-100 %] 99 % (11/01 1100) FiO2 (%):  [60 %-70 %] 70 % (11/01 0835)  Weight change:  Filed Weights   12/22/19 2100 12/23/19 0500 12/24/19 0500  Weight: 123.6 kg 123.6 kg 123.6 kg    Intake/Output: I/O last 3 completed shifts: In: 2740.6 [P.O.:480; I.V.:1876.6; IV Piggyback:384] Out: 2475 [Urine:2475]   Intake/Output this shift:  No intake/output data recorded.  Physical Exam: General: Critically ill  Head: Normocephalic, atraumatic. Moist oral mucosal membranes  Eyes: Anicteric, PERRL  Neck: Supple, trachea midline  Lungs:  +coarse breath sounds, HFNC 55%  Heart: Regular rate and rhythm  Abdomen:  Soft, nontender  Extremities: No peripheral edema.  Neurologic: Nonfocal, moving all four extremities  Skin: No lesions        Basic Metabolic Panel: Recent Labs  Lab 12/22/19 0849 12/22/19 0849 12/23/19 0351 12/23/19 1803 12/24/19 0142 12/24/19 0845 12/25/19 0529  NA 134*  --  131*  --  129* 131* 133*  K 4.6  --  5.4* 4.8 4.5  --  4.8  CL 100  --  98  --  97*  --  103  CO2 19*  --  19*  --  20*  --  20*  GLUCOSE 118*  --  205*  --  176*  --  136*  BUN 60*  --  71*  --  72*  --  66*  CREATININE 3.40*  --  3.59*  --  2.80*  --  2.49*  CALCIUM 8.5*   < > 8.0*  --  8.0*  --  7.8*  MG  --   --   --  2.4 2.3  --  2.5*  PHOS  --   --   --  4.0 4.2  --  3.8   < > = values in this interval not displayed.    Liver Function Tests: Recent Labs  Lab 12/22/19 0849 12/23/19 0351 12/24/19 0142 12/25/19 0529  AST 88* 61*  --   --   ALT 39 39  --   --   ALKPHOS 43 39  --   --   BILITOT 1.3* 1.0  --    --   PROT 7.3 6.6  --   --   ALBUMIN 3.1* 2.6* 2.6* 2.6*   No results for input(s): LIPASE, AMYLASE in the last 168 hours. No results for input(s): AMMONIA in the last 168 hours.  CBC: Recent Labs  Lab 12/22/19 0849 12/23/19 0351 12/24/19 0142 12/25/19 0529  WBC 7.1 4.5 9.4 9.8  NEUTROABS 5.9 3.5  --   --   HGB 16.4 14.8 13.7 14.0  HCT 49.5 43.9 40.9 40.8  MCV 86.7 86.2 85.4 84.5  PLT 223 210 305 328    Cardiac Enzymes: No results for input(s): CKTOTAL, CKMB, CKMBINDEX, TROPONINI in the last 168 hours.  BNP: Invalid input(s): POCBNP  CBG: Recent Labs  Lab 12/24/19 1115 12/24/19 1554 12/24/19 2112 12/25/19 0746 12/25/19 1109  GLUCAP 148* 166* 192* 180* 155*    Microbiology: Results for orders placed or performed during the hospital encounter of 12/22/19  Blood  Culture (routine x 2)     Status: None (Preliminary result)   Collection Time: 12/22/19  8:49 AM   Specimen: BLOOD  Result Value Ref Range Status   Specimen Description BLOOD RT [facility]  Final   Special Requests   Final    BOTTLES DRAWN AEROBIC AND ANAEROBIC Blood Culture adequate volume   Culture   Final    NO GROWTH 3 DAYS Performed at [facility], [address] [place], [place] [address]    Report Status PENDING  Incomplete  Blood Culture (routine x 2)     Status: None (Preliminary result)   Collection Time: 12/22/19  8:49 AM   Specimen: BLOOD  Result Value Ref Range Status   Specimen Description BLOOD RT HAND  Final   Special Requests   Final    BOTTLES DRAWN AEROBIC AND ANAEROBIC Blood Culture adequate volume   Culture   Final    NO GROWTH 3 DAYS Performed at [facility], [address], [place], [place] [address]    Report Status PENDING  Incomplete  Urine culture     Status: None   Collection Time: 12/22/19  8:49 AM   Specimen: Urine, Random  Result Value Ref Range Status   Specimen Description   Final    URINE, RANDOM Performed at [facility], [address], [place], [place] [address]    Special Requests   Final    NONE Performed at [facility], [address] [place], [place] [address]    Culture   Final    NO GROWTH Performed at [facility], [place] [address] [place], [place] [address]    Report Status 12/23/2019 FINAL  Final  Respiratory Panel by RT PCR (Flu A&B, Covid) - Nasopharyngeal Swab     Status: Abnormal   Collection Time: 12/22/19  8:49 AM   Specimen: Nasopharyngeal Swab  Result Value Ref Range Status   SARS Coronavirus 2 by RT PCR POSITIVE (A) NEGATIVE Final    Comment: RESULT CALLED TO, READ BACK BY AND VERIFIED WITH: KENDALL FLANNIGAN AT 3532 ON 12/22/2019 [place]. (NOTE) SARS-CoV-2 target nucleic acids are DETECTED.  SARS-CoV-2 RNA is generally detectable in upper respiratory specimens  during the acute phase of infection. Positive results are indicative of the presence of the identified virus, but do not rule out bacterial infection or co-infection with other pathogens not detected by the test. Clinical correlation with patient history and other diagnostic information is necessary to determine patient infection status. The expected result is Negative.  Fact Sheet for Patients:  PinkCheek.be  Fact Sheet for Healthcare Providers: GravelBags.it  This test is not yet approved or cleared by the [place] FDA and  has been authorized for detection and/or diagnosis of SARS-CoV-2 by FDA under an Emergency Use Authorization (EUA).  This EUA will remain in effect (meaning this te st can be used) for the duration of  the COVID-19 declaration under Section 564(b)(1) of the Act, 21 U.S.C. section 360bbb-3(b)(1), unless the authorization is terminated or revoked sooner.      Influenza A by PCR NEGATIVE NEGATIVE Final   Influenza B by PCR NEGATIVE NEGATIVE Final    Comment: (NOTE) The Xpert Xpress SARS-CoV-2/FLU/RSV assay is intended as  an aid in  the diagnosis of influenza from Nasopharyngeal swab specimens and  should not be used as a sole basis for treatment. Nasal washings and  aspirates are unacceptable for Xpert Xpress SARS-CoV-2/FLU/RSV  testing.  Fact Sheet for  Patients: PinkCheek.be  Fact Sheet for Healthcare Providers: GravelBags.it  This test is not yet approved or cleared by the [place] FDA and  has been authorized for detection and/or diagnosis of SARS-CoV-2 by  FDA under an Emergency Use Authorization (EUA). This EUA will remain  in effect (meaning this test can be used) for the duration of the  Covid-19 declaration under Section 564(b)(1) of the Act, 21  U.S.C. section 360bbb-3(b)(1), unless the authorization is  terminated or revoked. Performed at [facility], [address] [place], [place] [address]   MRSA PCR Screening     Status: None   Collection Time: 12/22/19  9:16 PM   Specimen: Nasal Mucosa; Nasopharyngeal  Result Value Ref Range Status   MRSA by PCR NEGATIVE NEGATIVE Final    Comment:        The GeneXpert MRSA Assay (FDA approved for NASAL specimens only), is one component of a comprehensive MRSA colonization surveillance program. It is not intended to diagnose MRSA infection nor to guide or monitor treatment for MRSA infections. Performed at [facility], [address] [place], [place] [address]     Coagulation Studies: No results for input(s): LABPROT, INR in the last 72 hours.  Urinalysis: No results for input(s): COLORURINE, LABSPEC, PHURINE, GLUCOSEU, HGBUR, BILIRUBINUR, KETONESUR, PROTEINUR, UROBILINOGEN, NITRITE, LEUKOCYTESUR in the last 72 hours.  Invalid input(s): APPERANCEUR    Imaging: CT HEAD WO CONTRAST  Result Date: 12/23/2019 CLINICAL DATA:  Neurologic deficit. EXAM: CT HEAD WITHOUT CONTRAST TECHNIQUE: Contiguous axial images were obtained from the base of the skull  through the vertex without intravenous contrast. COMPARISON:  CT maxillofacial dated 12/09/2005. FINDINGS: Brain: No evidence of acute infarction, hemorrhage, hydrocephalus, extra-axial collection or mass lesion/mass effect. There is mild cerebral volume loss with associated ex vacuo dilatation. Periventricular white matter hypoattenuation likely represents chronic small vessel ischemic disease. Vascular: There are vascular calcifications in the carotid siphons. Skull: Chronic, healed right facial bone fractures are partially imaged. Negative for acute fracture or focal lesion. Sinuses/Orbits: Calcified, inspissated mucus in the right maxillary sinus is redemonstrated. Other: None. IMPRESSION: No acute intracranial process. Electronically Signed   By: Zerita Boers M.D.   On: 12/23/2019 20:16   US RENAL  Result Date: 12/23/2019 CLINICAL DATA:  Acute renal failure. EXAM: RENAL / URINARY TRACT ULTRASOUND COMPLETE COMPARISON:  None. FINDINGS: Right Kidney: Renal measurements: 12.5 x 5.0 x 5.3 cm = volume: 172.6 mL. Contains multiple cysts. A representative cyst measures 4.3 cm. Diffuse increased cortical echogenicity. Left Kidney: Renal measurements: 12.8 x 6.1 x 5.8 cm = volume: 236.4 mL. Contains multiple cysts. A representative cyst measures 3.1 cm. Diffuse increased cortical echogenicity. Bladder: Appears normal for degree of bladder distention. Other: None. IMPRESSION: 1. Medical renal disease.  Bilateral renal cysts. Electronically Signed   By: Dorise Bullion III M.D   On: 12/23/2019 14:35   DG Chest Port 1 View  Result Date: 12/24/2019 CLINICAL DATA:  Follow-up for COVID-19 pneumonia. EXAM: PORTABLE CHEST 1 VIEW COMPARISON:  12/22/2019 and earlier studies. FINDINGS: Bilateral airspace lung opacities are without significant change from the most recent prior study, allowing for lower lung volumes on the current exam. No new lung abnormalities. No convincing pleural effusion and no pneumothorax.  IMPRESSION: 1. No significant interval change when compared to the most recent prior study. Bilateral airspace lung opacities consistent with multifocal pneumonia. Electronically Signed   By: Lajean Manes M.D.   On: 12/24/2019 09:03     Medications:   .  famotidine (PEPCID) IV    . heparin 1,200 Units/hr (12/25/19 0900)  . remdesivir 100 mg in NS 100 mL 100 mg (12/25/19 0954)   . allopurinol  100 mg Oral Daily  . amiodarone  200 mg Oral Daily  . atorvastatin  80 mg Oral QHS  . carvedilol  3.125 mg Oral BID WC  . Chlorhexidine Gluconate Cloth  6 each Topical Daily  . dexamethasone (DECADRON) injection  6 mg Intravenous Q24H  . insulin aspart  0-20 Units Subcutaneous TID AC & HS  . levothyroxine  100 mcg Oral QAC breakfast  . traZODone  50 mg Oral QHS   docusate sodium, hydrALAZINE, HYDROcodone-acetaminophen, polyethylene glycol  Assessment/ Plan:  [name] is a 54 y.o. black male with atrial fibrillation, hyperlipidemia, congestive heart failure, alcohol abuse, hypothyroidism who was admitted to [facility] on 12/22/2019 for Hypoxia [R09.02] Pneumonia due to COVID-19 virus [U07.1, J12.82] COVID-19 [U07.1]   1. Acute kidney injury with hyperkalemia, metabolic acidosis and hyponatremia on chronic kidney disease stage IIIB. Baseline creatinine of 2.37, GFR of 30 on 02/08/2019. Followed by [facility].  Renal ultrasound reviewed, consistent with chronic kidney disease.  - holding furosemide, entresto.  - status post Lokelma - Continue IV fluid: Normal saline at 51mL/hr for 1 day.  - Encourage PO intake  2. Hypertension: with urgency on admission. Current regimen of carvedilol.  Elevated at 136/98   LOS: 3 Zollie Clemence 11/1/202111:45 AM

## 2019-12-25 NOTE — Consult Note (Signed)
ANTICOAGULATION CONSULT NOTE   Pharmacy Consult for Heparin Infusion Indication: atrial fibrillation  Patient Measurements: Height: 5\' 11"  (180.3 cm) Weight: 123.6 kg (272 lb 7.8 oz) IBW/kg (Calculated) : 75.3 Heparin Dosing Weight: 104.8 kg  Labs: Recent Labs    12/22/19 0849 12/22/19 0849 12/23/19 0351 12/23/19 0351 12/23/19 1803 12/24/19 0142 12/24/19 0449 12/25/19 0529  HGB 16.4   < > 14.8   < >  --  13.7  --  14.0  HCT 49.5   < > 43.9  --   --  40.9  --  40.8  PLT 223   < > 210  --   --  305  --  328  APTT 31   < >  --   --  81* 101* 123*  --   LABPROT 13.3  --   --   --   --   --   --   --   INR 1.1  --   --   --   --   --   --   --   HEPARINUNFRC  --   --  1.77*  --   --  1.28*  --  0.99*  CREATININE 3.40*   < > 3.59*  --   --  2.80*  --  2.49*   < > = values in this interval not displayed.    Estimated Creatinine Clearance: 44.9 mL/min (A) (by C-G formula based on SCr of 2.49 mg/dL (H)).   Medical History: Past Medical History:  Diagnosis Date  . Alcohol abuse   . 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 12/2007 MV: EF 28%, no isch;  b. 8/12 Echo: EF 25-35%; c. 02/2014 Echo: EF 20-25%; d. 12/2014 Cath: minimal CAD; e. 01/2015 Echo: EF 50-55%;  d. 05/2016 Echo: EF 30-35%, diff HK, gr2 DD; e. 11/2016 Echo: EF 45-50%, diff HK.  . Chronic combined systolic (congestive) and diastolic (congestive) heart failure ([place])    a. 05/2016 Most recent Echo: EF 30-35%, diff HK, Gr2 DD, mild MR, mod dil LA; b. 11/2016 Echo: EF 45-50%, diff HK.  . CKD (chronic kidney disease), stage III ([place])   . Elevated troponin    Chronically elevated. - level was 0.17-0.10 during recent admission  . Essential hypertension   . GI bleed 11/2013  . Hyperlipidemia   . Pancreatitis   . Paroxysmal A-fib ([place])    a. new onset s/p unsuccessful TEE/DCCV on 08/16/2014; b. on amio/eliquis (CHA2DS2VASc = 2-3); c. reports 1-2 hrs of afib ~ q68mos.  . Paroxysmal atrial flutter ([place])    a. new onset  07/2014; b. s/p unsuccessful TEE/DCCV 08/16/2014; c. on apixaban.  . Sleep-disordered breathing    Has yet to have a sleep study    Medications:  Apixaban 5 mg BID for atrial fibrillation (last dose 10/29 at 1839)  Assessment: Patient is a 54 y/o M with medical history including atrial fibrillation on apixaban, CHF, CKD, alcohol abuse who is admitted with severe COVID-19 pneumonia with ARDS. Admission further complicated by AKI. Pharmacy has been consulted to initiate heparin infusion in this patient due to concerns about clearance of apixaban given renal dysfunction.   Per chart, patient is currently in NSR. Baseline aPTT 31, INR 1.1, anti-Xa 1.77. H&H, platelets within normal limits. D-dimer elevated.   Heparin Course: 10/31 initiation: 1450 units/hr 10/31 0449 aPTT 123s: rate decreased to 1200 units/hr 11/1 1455 aPTT 121s: rate decreased to 1000 units/hr  Goal of Therapy:  Heparin level 0.3-0.7 units/ml  aPTT 66 - 102 seconds Monitor platelets by anticoagulation protocol: Yes   Plan:   aPTT SUPRAtherapeutic:  Reduce infusion rate to 1000 units/hr  recheck aPTT in 6 hrs after rate change  Continue to follow aPTT given apixaban interference with anti-Xa level. Can switch over to anti-Xa monitoring when correlation is established  Daily HL, aPTT, CBC per protocol  Dallie Piles 12/25/2019,7:28 AM

## 2019-12-25 NOTE — Progress Notes (Signed)
PHARMACY CONSULT NOTE  Pharmacy Consult for Electrolyte Monitoring and Replacement   Recent Labs: Potassium (mmol/L)  Date Value  12/25/2019 4.8  03/08/2014 3.9   Magnesium (mg/dL)  Date Value  12/25/2019 2.5 (H)   Calcium (mg/dL)  Date Value  12/25/2019 7.8 (L)   Calcium, Total (mg/dL)  Date Value  03/08/2014 7.9 (L)   Albumin (g/dL)  Date Value  12/25/2019 2.6 (L)  10/11/2018 4.2  03/07/2014 3.4   Phosphorus (mg/dL)  Date Value  12/25/2019 3.8   Sodium (mmol/L)  Date Value  12/25/2019 133 (L)  10/11/2018 141  03/08/2014 135 (L)   Corrected Ca: 8.92 mg/dL  Assessment: 54 y/o M with medical history including atrial fibrillation on apixaban, CHF, CKD, alcohol abuse who is admitted with severe COVID-19 pneumonia with ARDS. Admission further complicated by AKI which has improved  since admission.   Goal of Therapy:  Potassium 4.0 - 5.1 mmol/L Magnesium 2.0 - 2.4 mg/dL All Other Electrolytes WNL  Plan:   No electrolyte replacement warranted today  Hyponatremia: on 0.9% NaCl @ 75 mL/hr  Re-check electrolytes in am  Hector [name] ,PharmD Clinical Pharmacist 12/25/2019 7:09 AM

## 2019-12-26 DIAGNOSIS — J1282 Pneumonia due to coronavirus disease 2019: Secondary | ICD-10-CM | POA: Diagnosis not present

## 2019-12-26 DIAGNOSIS — U071 COVID-19: Secondary | ICD-10-CM | POA: Diagnosis not present

## 2019-12-26 LAB — CBC
HCT: 40.5 % (ref 39.0–52.0)
Hemoglobin: 13.7 g/dL (ref 13.0–17.0)
MCH: 29.1 pg (ref 26.0–34.0)
MCHC: 33.8 g/dL (ref 30.0–36.0)
MCV: 86.2 fL (ref 80.0–100.0)
Platelets: 335 10*3/uL (ref 150–400)
RBC: 4.7 MIL/uL (ref 4.22–5.81)
RDW: 13.9 % (ref 11.5–15.5)
WBC: 11.5 10*3/uL — ABNORMAL HIGH (ref 4.0–10.5)
nRBC: 0.2 % (ref 0.0–0.2)

## 2019-12-26 LAB — APTT
aPTT: 107 seconds — ABNORMAL HIGH (ref 24–36)
aPTT: 51 seconds — ABNORMAL HIGH (ref 24–36)
aPTT: 98 seconds — ABNORMAL HIGH (ref 24–36)

## 2019-12-26 LAB — LEGIONELLA PNEUMOPHILA SEROGP 1 UR AG: L. pneumophila Serogp 1 Ur Ag: NEGATIVE

## 2019-12-26 LAB — RENAL FUNCTION PANEL
Albumin: 2.7 g/dL — ABNORMAL LOW (ref 3.5–5.0)
Anion gap: 9 (ref 5–15)
BUN: 62 mg/dL — ABNORMAL HIGH (ref 6–20)
CO2: 18 mmol/L — ABNORMAL LOW (ref 22–32)
Calcium: 8.1 mg/dL — ABNORMAL LOW (ref 8.9–10.3)
Chloride: 107 mmol/L (ref 98–111)
Creatinine, Ser: 2.25 mg/dL — ABNORMAL HIGH (ref 0.61–1.24)
GFR, Estimated: 34 mL/min — ABNORMAL LOW (ref >60)
Glucose, Bld: 112 mg/dL — ABNORMAL HIGH (ref 70–99)
Phosphorus: 3.6 mg/dL (ref 2.5–4.6)
Potassium: 4.9 mmol/L (ref 3.5–5.1)
Sodium: 134 mmol/L — ABNORMAL LOW (ref 135–145)

## 2019-12-26 LAB — FIBRIN DERIVATIVES D-DIMER (ARMC ONLY): Fibrin derivatives D-dimer (ARMC): 997.99 ng/mL (FEU) — ABNORMAL HIGH (ref 0.00–499.00)

## 2019-12-26 LAB — GLUCOSE, CAPILLARY
Glucose-Capillary: 103 mg/dL — ABNORMAL HIGH (ref 70–99)
Glucose-Capillary: 159 mg/dL — ABNORMAL HIGH (ref 70–99)
Glucose-Capillary: 214 mg/dL — ABNORMAL HIGH (ref 70–99)
Glucose-Capillary: 188 mg/dL — ABNORMAL HIGH (ref 70–99)

## 2019-12-26 LAB — MAGNESIUM: Magnesium: 2.6 mg/dL — ABNORMAL HIGH (ref 1.7–2.4)

## 2019-12-26 LAB — FERRITIN: Ferritin: 952 ng/mL — ABNORMAL HIGH (ref 24–336)

## 2019-12-26 LAB — C-REACTIVE PROTEIN: CRP: 2.6 mg/dL — ABNORMAL HIGH (ref <1.0)

## 2019-12-26 MED ORDER — APIXABAN 5 MG PO TABS
5.0000 mg | ORAL_TABLET | Freq: Two times a day (BID) | ORAL | Status: DC
  Administered 2019-12-26 – 2020-01-03 (×16): 5 mg via ORAL
  Filled 2019-12-26 (×16): qty 1

## 2019-12-26 MED ORDER — AMLODIPINE BESYLATE 5 MG PO TABS
5.0000 mg | ORAL_TABLET | Freq: Every day | ORAL | Status: DC
  Administered 2019-12-26 – 2019-12-30 (×5): 5 mg via ORAL
  Filled 2019-12-26 (×5): qty 1

## 2019-12-26 NOTE — Evaluation (Signed)
Occupational Therapy Evaluation Patient Details Name: Hector [name] MRN: [id number] DOB: May 14, 1965 Today's Date: 12/26/2019    History of Present Illness Pt is a 54 y/o M with PMH: CKD3, AFib, HLD, CHF, acohol abuse, and hypothyroidism who presented to ER secondary to progressive SOB, cough, weakness; admitted for management of acute respiratory failure with ARDS related to COVID-19.  Hospital course additionally significant for onset of L UE > LE numbness, coordination deficits; head CT negative, patient refusing MRI per chart.   Clinical Impression   Pt was seen for OT evaluation this date. Prior to hospital admission, pt was INDEP with self care as well as caring for his mother that he lives with. In addition, pt reports generally not needing AD for fxl mobility, but using in recent weeks before hospital admission d/t feeling weak. Pt lives with his mom in [facility] with 3 STE. Currently pt demonstrates impairments as described below (See OT problem list) which functionally limit his ability to perform ADL/self-care tasks. Pt currently requires MOD A and moderate encouragement to participate in bed-level UB ADLs. Pt reluctant to even wash hands in prep for meal time and not receptive to education that OT attempts to provide re: rationale behind hand washing for infection prevention. Pt ultimately becomes aggitated with OT AT this time and session is terminated before more thorough data and treatment could be completed. OT did manage to engage pt in hand washing, but no other aspect of self care. In addition, OT provides handout, demo and education re: bed-level UB HEP to improve/maintain strength of UEs. Pt minimally receptive to this task and demos some reception of this education. Pt would benefit from skilled OT services, as he is agreeable, to address noted impairments and functional limitations. Upon hospital discharge, recommend STR to maximize pt safety and return to PLOF.     Follow Up  Recommendations  SNF    Equipment Recommendations  Other (comment) (defer to next level of care)    Recommendations for Other Services       Precautions / Restrictions Precautions Precautions: Fall Restrictions Weight Bearing Restrictions: No      Mobility Bed Mobility               General bed mobility comments: declines to perform bed mobility for OT on assessment    Transfers                 General transfer comment: deferred, pt refuses. Cites fatigue from getting up with PT earlier    Balance       Sitting balance - Comments: NT       Standing balance comment: NT                           ADL either performed or assessed with clinical judgement   ADL Overall ADL's : Needs assistance/impaired Eating/Feeding: Set up;Bed level Eating/Feeding Details (indicate cue type and reason): HOB elevated Grooming: Wash/dry hands;Moderate assistance;Bed level Grooming Details (indicate cue type and reason): HOB elevated, MOD verbal cues to encourage some level of participation and educate re: importance as pt states "I never wash my hands!". Pt not receptive to education and becomes aggitated with therapist.         Upper Body Dressing : Maximal assistance;Bed level Upper Body Dressing Details (indicate cue type and reason): based on clinical observation Lower Body Dressing: Total assistance;Bed level Lower Body Dressing Details (indicate cue type and reason): based on  clinical observation   Toilet Transfer Details (indicate cue type and reason): pt declines to get OOB on OT assessment, per PT note, MOD A For transfers                 Vision Patient Visual Report: No change from baseline       Perception     Praxis      Pertinent Vitals/Pain Pain Assessment: No/denies pain     Hand Dominance Right   Extremity/Trunk Assessment Upper Extremity Assessment Upper Extremity Assessment: RUE deficits/detail;LUE deficits/detail RUE  Deficits / Details: grossly 4+/5 shoulder, elbow, and grip, sensation and motor control appear to be grossly in-tact LUE Deficits / Details: shoulder/elbow grossly 4-/5, grip grossly 3/5 LUE Coordination: decreased fine motor;decreased gross motor   Lower Extremity Assessment Lower Extremity Assessment: Defer to PT evaluation (grossly 4-/5)       Communication Communication Communication: Other (comment) (occasionally somewhat garbled or pt mumbles, but overall clear and understandable speech)   Cognition Arousal/Alertness: Awake/alert Behavior During Therapy: Agitated;Flat affect Overall Cognitive Status: Within Functional Limits for tasks assessed                                 General Comments: Pt oritented to place, year and situation, requires 1 verbal cue for month. Pt mostly appropriate with command following and conversationally, initially, but gradually becomes more and more agitated/frustrated throughout even with simple requests or attempts to assist. Pt c/o being too fatigued from PT two hours prior, becomes very frustrated with attempts to provide education re: infectious disease prevention including handwashing.   General Comments  monitor O2, pt on HHFNC at 55L throughout session this date    Exercises Other Exercises Other Exercises: OT facilitates ed re: role of OT in acute setting, importance of self care ADLs to prevent skin breakdown, prevent further illness/infection (especially hand hygiene and oral care) and to prevent atrophy. Pt not receptive to education this date, progressively becomes more frustrated wtih therapist and ultimately only agrees to hand washing to get OT to leave. Other Exercises: In addition, OT provides custom UB bed level HEP and provides demo of exercises as well as green resistance band, pt unwilling to attempt to perform any of these exercises at this time, but agreeable to OT demo.   Shoulder Instructions      Home Living  Family/patient expects to be discharged to:: Private residence Living Arrangements: Parent Available Help at Discharge: Family Type of Home: House Home Access: Stairs to enter Technical brewer of Steps: 3 Entrance Stairs-Rails: Right;Left;Can reach both Home Layout: One level               [place]: [place] - single point;Walker - 2 wheels          Prior Functioning/Environment Level of Independence: Independent        Comments: Indep with ADLs, household and community mobilization; no home O2; denies fall history.  Does endorse recent use of assist device related to current medical/functional decline.        OT Problem List: Decreased strength;Decreased activity tolerance;Impaired balance (sitting and/or standing);Decreased coordination;Decreased safety awareness;Decreased knowledge of use of DME or AE;Cardiopulmonary status limiting activity      OT Treatment/Interventions: Self-care/ADL training;DME and/or AE instruction;Therapeutic activities;Balance training;Therapeutic exercise;Neuromuscular education;Energy conservation;Patient/family education    OT Goals(Current goals can be found in the care plan section) Acute Rehab OT Goals Patient Stated Goal: to get my  strength back OT Goal Formulation: With patient Time For Goal Achievement: 01/09/20 Potential to Achieve Goals: Fair ADL Goals Pt Will Perform Grooming: with supervision;sitting (to perform 2-3 g/h tasks to increase seated fxl activity tolerance.) Pt Will Perform Lower Body Dressing: with mod assist;sit to/from stand (with AE PRN) Pt Will Transfer to Toilet: with min assist;bedside commode (with LRAD ~5-10' to increase fxl mobility tolernace and ADL transfer safety) Pt Will Perform Toileting - Clothing Manipulation and hygiene: with min assist;sit to/from stand Pt/caregiver will Perform Home Exercise Program: Increased strength;Both right and left upper extremity;With minimal assist  OT Frequency:  Min 1X/week   Barriers to D/C:            Co-evaluation              AM-PAC OT "6 Clicks" Daily Activity     Outcome Measure Help from another person eating meals?: None Help from another person taking care of personal grooming?: A Little Help from another person toileting, which includes using toliet, bedpan, or urinal?: A Lot Help from another person bathing (including washing, rinsing, drying)?: A Lot Help from another person to put on and taking off regular upper body clothing?: A Little Help from another person to put on and taking off regular lower body clothing?: Total 6 Click Score: 15   End of Session Nurse Communication: Mobility status;Other (comment) (notified RN that pt not agreeable to oral care, bathing/washing up, or any other level of ADL participation beyond hand washing which is is ultimately reluctant to do as well.)  Activity Tolerance: Other (comment) (self-limiting) Patient left: in bed;with call bell/phone within reach;with bed alarm set;Other (comment) (tray table across lap in anticipation of lunch)  OT Visit Diagnosis: Muscle weakness (generalized) (M62.81);Hemiplegia and hemiparesis Hemiplegia - Right/Left: Left Hemiplegia - caused by: Unspecified                Time: 7412-8786 OT Time Calculation (min): 21 min Charges:  OT General Charges $OT Visit: 1 Visit OT Evaluation $OT Eval Moderate Complexity: 1 Mod OT Treatments $Self Care/Home Management : 8-22 mins  Gerrianne Scale, MS, OTR/L ascom [phone] 12/26/19, 3:27 PM

## 2019-12-26 NOTE — Consult Note (Signed)
ANTICOAGULATION CONSULT NOTE   Pharmacy Consult for Heparin Infusion Indication: atrial fibrillation  Patient Measurements: Height: 5\' 11"  (180.3 cm) Weight: 123.6 kg (272 lb 7.8 oz) IBW/kg (Calculated) : 75.3 Heparin Dosing Weight: 104.8 kg  Labs: Recent Labs    12/23/19 0351 12/23/19 1803 12/24/19 0142 12/24/19 0142 12/24/19 0449 12/25/19 0529 12/25/19 1455 12/26/19 0012  HGB 14.8  --  13.7  --   --  14.0  --   --   HCT 43.9  --  40.9  --   --  40.8  --   --   PLT 210  --  305  --   --  328  --   --   APTT  --    < > 101*   < > 123*  --  121* 107*  HEPARINUNFRC 1.77*  --  1.28*  --   --  0.99*  --   --   CREATININE 3.59*  --  2.80*  --   --  2.49*  --   --    < > = values in this interval not displayed.    Estimated Creatinine Clearance: 44.9 mL/min (A) (by C-G formula based on SCr of 2.49 mg/dL (H)).   Medical History: Past Medical History:  Diagnosis Date  . Alcohol abuse   . 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 12/2007 MV: EF 28%, no isch;  b. 8/12 Echo: EF 25-35%; c. 02/2014 Echo: EF 20-25%; d. 12/2014 Cath: minimal CAD; e. 01/2015 Echo: EF 50-55%;  d. 05/2016 Echo: EF 30-35%, diff HK, gr2 DD; e. 11/2016 Echo: EF 45-50%, diff HK.  . Chronic combined systolic (congestive) and diastolic (congestive) heart failure ([place])    a. 05/2016 Most recent Echo: EF 30-35%, diff HK, Gr2 DD, mild MR, mod dil LA; b. 11/2016 Echo: EF 45-50%, diff HK.  . CKD (chronic kidney disease), stage III ([place])   . Elevated troponin    Chronically elevated. - level was 0.17-0.10 during recent admission  . Essential hypertension   . GI bleed 11/2013  . Hyperlipidemia   . Pancreatitis   . Paroxysmal A-fib ([place])    a. new onset s/p unsuccessful TEE/DCCV on 08/16/2014; b. on amio/eliquis (CHA2DS2VASc = 2-3); c. reports 1-2 hrs of afib ~ q23mos.  . Paroxysmal atrial flutter ([place])    a. new onset 07/2014; b. s/p unsuccessful TEE/DCCV 08/16/2014; c. on apixaban.  . Sleep-disordered breathing     Has yet to have a sleep study    Medications:  Apixaban 5 mg BID for atrial fibrillation (last dose 10/29 at 1839)  Assessment: Patient is a 54 y/o M with medical history including atrial fibrillation on apixaban, CHF, CKD, alcohol abuse who is admitted with severe COVID-19 pneumonia with ARDS. Admission further complicated by AKI. Pharmacy has been consulted to initiate heparin infusion in this patient due to concerns about clearance of apixaban given renal dysfunction.   Per chart, patient is currently in NSR. Baseline aPTT 31, INR 1.1, anti-Xa 1.77. H&H, platelets within normal limits. D-dimer elevated.   Heparin Course: 10/31 initiation: 1450 units/hr 10/31 0449 aPTT 123s: rate decreased to 1200 units/hr 11/1 1455 aPTT 121s: rate decreased to 1000 units/hr 11/2 0012 aPTT 107s: rate decreased to 900 units/hr  Goal of Therapy:  Heparin level 0.3-0.7 units/ml aPTT 66 - 102 seconds Monitor platelets by anticoagulation protocol: Yes   Plan:   aPTT SUPRAtherapeutic:  Reduce infusion rate to 900 units/hr  recheck aPTT in 6 hrs after rate change  Continue to follow aPTT given apixaban interference with anti-Xa level. Can switch over to anti-Xa monitoring when correlation is established  Daily HL, aPTT, CBC per protocol  Sarah Zerby D 12/26/2019,1:30 AM

## 2019-12-26 NOTE — Progress Notes (Signed)
Bed scale broken unable to weigh patient. Asked patient if this RN could check his bed pad to make sure external cath did not leak, patient stated it did not and refused to let this RN check bed pad.

## 2019-12-26 NOTE — Progress Notes (Signed)
[facility]  ROUNDING NOTE   Subjective:   States his weakness has improved. Continues to have numbness on his right side.   UOP 1862mL.  Creatinine 2.25 (2.49) (2.8) Na 134 (133) (131)  Objective:  Vital signs in last 24 hours:  Temp:  [96 F (35.6 C)-97.6 F (36.4 C)] 97.1 F (36.2 C) (11/02 0800) Pulse Rate:  [53-74] 69 (11/02 0900) Resp:  [17-30] 30 (11/02 0900) BP: (131-190)/(95-147) 148/102 (11/02 0900) SpO2:  [92 %-100 %] 93 % (11/02 0900) FiO2 (%):  [60 %-70 %] 60 % (11/02 0813)  Weight change:  Filed Weights   12/22/19 2100 12/23/19 0500 12/24/19 0500  Weight: 123.6 kg 123.6 kg 123.6 kg    Intake/Output: I/O last 3 completed shifts: In: 4471.4 [P.O.:1540; I.V.:2564.3; IV Piggyback:367.1] Out: 2600 [Urine:2600]   Intake/Output this shift:  Total I/O In: 594.9 [P.O.:350; I.V.:244.9] Out: 550 [Urine:550]  Physical Exam: General: Critically ill  Head: Normocephalic, atraumatic. Moist oral mucosal membranes  Eyes: Anicteric, PERRL  Neck: Supple, trachea midline  Lungs:  +coarse breath sounds, HFNC 55%  Heart: Regular rate and rhythm  Abdomen:  Soft, nontender  Extremities: No peripheral edema.  Neurologic: Nonfocal, moving all four extremities  Skin: No lesions        Basic Metabolic Panel: Recent Labs  Lab 12/22/19 0849 12/22/19 0849 12/23/19 0351 12/23/19 0351 12/23/19 1803 12/24/19 0142 12/24/19 0845 12/25/19 0529 12/26/19 0711  NA 134*   < > 131*  --   --  129* 131* 133* 134*  K 4.6   < > 5.4*  --  4.8 4.5  --  4.8 4.9  CL 100  --  98  --   --  97*  --  103 107  CO2 19*  --  19*  --   --  20*  --  20* 18*  GLUCOSE 118*  --  205*  --   --  176*  --  136* 112*  BUN 60*  --  71*  --   --  72*  --  66* 62*  CREATININE 3.40*  --  3.59*  --   --  2.80*  --  2.49* 2.25*  CALCIUM 8.5*   < > 8.0*   < >  --  8.0*  --  7.8* 8.1*  MG  --   --   --   --  2.4 2.3  --  2.5* 2.6*  PHOS  --   --   --   --  4.0 4.2  --  3.8 3.6   < > =  values in this interval not displayed.    Liver Function Tests: Recent Labs  Lab 12/22/19 0849 12/23/19 0351 12/24/19 0142 12/25/19 0529 12/26/19 0711  AST 88* 61*  --   --   --   ALT 39 39  --   --   --   ALKPHOS 43 39  --   --   --   BILITOT 1.3* 1.0  --   --   --   PROT 7.3 6.6  --   --   --   ALBUMIN 3.1* 2.6* 2.6* 2.6* 2.7*   No results for input(s): LIPASE, AMYLASE in the last 168 hours. No results for input(s): AMMONIA in the last 168 hours.  CBC: Recent Labs  Lab 12/22/19 0849 12/23/19 0351 12/24/19 0142 12/25/19 0529 12/26/19 0711  WBC 7.1 4.5 9.4 9.8 11.5*  NEUTROABS 5.9 3.5  --   --   --  HGB 16.4 14.8 13.7 14.0 13.7  HCT 49.5 43.9 40.9 40.8 40.5  MCV 86.7 86.2 85.4 84.5 86.2  PLT 223 210 305 328 335    Cardiac Enzymes: No results for input(s): CKTOTAL, CKMB, CKMBINDEX, TROPONINI in the last 168 hours.  BNP: Invalid input(s): POCBNP  CBG: Recent Labs  Lab 12/25/19 1109 12/25/19 1620 12/25/19 2155 12/26/19 0720 12/26/19 1115  GLUCAP 155* 174* 188* 103* 159*    Microbiology: Results for orders placed or performed during the hospital encounter of 12/22/19  Blood Culture (routine x 2)     Status: None (Preliminary result)   Collection Time: 12/22/19  8:49 AM   Specimen: BLOOD  Result Value Ref Range Status   Specimen Description BLOOD RT [facility]  Final   Special Requests   Final    BOTTLES DRAWN AEROBIC AND ANAEROBIC Blood Culture adequate volume   Culture   Final    NO GROWTH 4 DAYS Performed at [facility], [address], [place], [place] [address]    Report Status PENDING  Incomplete  Blood Culture (routine x 2)     Status: None (Preliminary result)   Collection Time: 12/22/19  8:49 AM   Specimen: BLOOD  Result Value Ref Range Status   Specimen Description BLOOD RT HAND  Final   Special Requests   Final    BOTTLES DRAWN AEROBIC AND ANAEROBIC Blood Culture adequate volume   Culture   Final    NO GROWTH 4 DAYS Performed at  [facility], [address] [place], [place] [address]    Report Status PENDING  Incomplete  Urine culture     Status: None   Collection Time: 12/22/19  8:49 AM   Specimen: Urine, Random  Result Value Ref Range Status   Specimen Description   Final    URINE, RANDOM Performed at [facility], [address] [place], [place] [address]    Special Requests   Final    NONE Performed at [facility], [address] [place], [place] [address]    Culture   Final    NO GROWTH Performed at [facility], [place] [address], [place], [place] [address]    Report Status 12/23/2019 FINAL  Final  Respiratory Panel by RT PCR (Flu A&B, Covid) - Nasopharyngeal Swab     Status: Abnormal   Collection Time: 12/22/19  8:49 AM   Specimen: Nasopharyngeal Swab  Result Value Ref Range Status   SARS Coronavirus 2 by RT PCR POSITIVE (A) NEGATIVE Final    Comment: RESULT CALLED TO, READ BACK BY AND VERIFIED WITH: KENDALL FLANNIGAN AT 2878 ON 12/22/2019 [place]. (NOTE) SARS-CoV-2 target nucleic acids are DETECTED.  SARS-CoV-2 RNA is generally detectable in upper respiratory specimens  during the acute phase of infection. Positive results are indicative of the presence of the identified virus, but do not rule out bacterial infection or co-infection with other pathogens not detected by the test. Clinical correlation with patient history and other diagnostic information is necessary to determine patient infection status. The expected result is Negative.  Fact Sheet for Patients:  PinkCheek.be  Fact Sheet for Healthcare Providers: GravelBags.it  This test is not yet approved or cleared by the [place] FDA and  has been authorized for detection and/or diagnosis of SARS-CoV-2 by FDA under an Emergency Use Authorization (EUA).  This EUA will remain in effect (meaning this te st can be used) for the duration of   the COVID-19 declaration under Section  564(b)(1) of the Act, 21 U.S.C. section 360bbb-3(b)(1), unless the authorization is terminated or revoked sooner.      Influenza A by PCR NEGATIVE NEGATIVE Final   Influenza B by PCR NEGATIVE NEGATIVE Final    Comment: (NOTE) The Xpert Xpress SARS-CoV-2/FLU/RSV assay is intended as an aid in  the diagnosis of influenza from Nasopharyngeal swab specimens and  should not be used as a sole basis for treatment. Nasal washings and  aspirates are unacceptable for Xpert Xpress SARS-CoV-2/FLU/RSV  testing.  Fact Sheet for Patients: PinkCheek.be  Fact Sheet for Healthcare Providers: GravelBags.it  This test is not yet approved or cleared by the [place] FDA and  has been authorized for detection and/or diagnosis of SARS-CoV-2 by  FDA under an Emergency Use Authorization (EUA). This EUA will remain  in effect (meaning this test can be used) for the duration of the  Covid-19 declaration under Section 564(b)(1) of the Act, 21  U.S.C. section 360bbb-3(b)(1), unless the authorization is  terminated or revoked. Performed at [facility], [address] [place], [place] [address]   MRSA PCR Screening     Status: None   Collection Time: 12/22/19  9:16 PM   Specimen: Nasal Mucosa; Nasopharyngeal  Result Value Ref Range Status   MRSA by PCR NEGATIVE NEGATIVE Final    Comment:        The GeneXpert MRSA Assay (FDA approved for NASAL specimens only), is one component of a comprehensive MRSA colonization surveillance program. It is not intended to diagnose MRSA infection nor to guide or monitor treatment for MRSA infections. Performed at [facility], [address] [place], [place] [address]     Coagulation Studies: No results for input(s): LABPROT, INR in the last 72 hours.  Urinalysis: No results for input(s): COLORURINE, LABSPEC, PHURINE, GLUCOSEU, HGBUR,  BILIRUBINUR, KETONESUR, PROTEINUR, UROBILINOGEN, NITRITE, LEUKOCYTESUR in the last 72 hours.  Invalid input(s): APPERANCEUR    Imaging: No results found.   Medications:   . heparin 900 Units/hr (12/26/19 0600)   . allopurinol  100 mg Oral Daily  . amiodarone  200 mg Oral Daily  . atorvastatin  80 mg Oral QHS  . carvedilol  3.125 mg Oral BID WC  . Chlorhexidine Gluconate Cloth  6 each Topical Daily  . dexamethasone (DECADRON) injection  6 mg Intravenous Q24H  . insulin aspart  0-20 Units Subcutaneous TID AC & HS  . levothyroxine  100 mcg Oral QAC breakfast  . traZODone  50 mg Oral QHS   docusate sodium, hydrALAZINE, HYDROcodone-acetaminophen, polyethylene glycol  Assessment/ Plan:  [name] is a 54 y.o. black male with atrial fibrillation, hyperlipidemia, congestive heart failure, alcohol abuse, hypothyroidism who was admitted to [facility] on 12/22/2019 for Hypoxia [R09.02] Pneumonia due to COVID-19 virus [U07.1, J12.82] COVID-19 [U07.1]   1. Acute kidney injury with hyperkalemia, metabolic acidosis and hyponatremia on chronic kidney disease stage IIIB. Baseline creatinine of 2.37, GFR of 30 on 02/08/2019. Followed by [facility].  Renal ultrasound reviewed, consistent with chronic kidney disease.  Creatinine back to baseline - holding furosemide, entresto.  - status post Lokelma and IV fluids. No indication of either at this time. No indication for dialysis - Encourage PO intake  2. Hypertension: with urgency on admission. Current regimen of carvedilol.  Elevated at 148/102 - IV hydralazine PRN - If continues to be elevated, will add another agent such as amlodipine   LOS: 4 Denia Mcvicar 11/2/202111:32 AM

## 2019-12-26 NOTE — Progress Notes (Signed)
Shift summary. Pt sat on side of bed today with PT, stood for a minute, was able to take a couple steps then returned to bed.  Heparin gtt stopped this evening, eloquis restarted. FiO2 weaned down to 50%. Pt continues to complain of numbness/tingling in left arm and leg but does not want any further scans. Dr.Kasa discussed this with him yesterday as well and he did not want further imaging. Pt overall becoming more tolerant with activity.

## 2019-12-26 NOTE — Progress Notes (Signed)
NAME:  Hector [name], MRN:  [id number], DOB:  1965-09-24, LOS: 4 ADMISSION DATE:  12/22/2019, CONSULTATION DATE:  12/22/2019 REFERRING MD:  Dr. Kerman Passey, CHIEF COMPLAINT: Dyspnea   Brief History   54 yo male unvaccinated presenting with acute hypoxic respiratory failure secondary to new COVID-19 diagnosis requiring HHFNC admitted to the SDU.  Past Medical History  Alcoholic cardiomyopathy OSA - wears CPAP HTN CKD, stage 3 HLD Chronic Combined heart failure Pancreatitis PAF Hypothyroidism  Significant Hospital Events   12/22/19 Admit to the ICU  Consults:  Nephrology Diabetes Coordinator  Procedures:    Significant Diagnostic Tests:  12/23/19 CT head w/o contrast >> negative 12/23/19 BLE venous Dopplers >> negative for DVT 12/23/19 Renal US >> chronic renal disease, bilateral renal cysts  Micro Data:  12/22/19 COVID-19 >> positive 12/22/19 Influenza A/B >> negative 12/22/19 MRSA PCR >> negative 12/22/19 UA >> positive 12/22/19 Urine Culture >> negative 12/22/19 Blood Cultures >>   Antimicrobials:  12/22/19 Cefepime x 1 12/22/19 Ceftriaxone >> 11/1 (3 doses)  Interim history/subjective:  Patient alert, watching TV, wanting to go home.  Discussed his plan of care.  Patient was unclear as to why he was being prescribed insulin, as well as other medications that he does not normally take at home.  Discussed COVID-19 diagnosis as well as the consequences of needing high dose steroids and how that impacts his blood sugar.  Patient continues to c/o LUE and LLE intermittent numbness as well as "pain".  He refuses any additional test/scans and became frustrated when asked additional clarifying questions.  Explained that the healthcare team only wants him to improve and be able to go home as soon as possible but that he is still very ill and is requiring a lot of oxygen support.  Labs/ Imaging personally reviewed Net: + 1.3 L (+ 1.5 L since admission) Na+/ K+: 134/  4.9 BUN/Cr.: 62/ 2.25 Hgb: 13.7 WBC/ TMAX: 11.5/ 36.3  Ferritin: 952 CRP: 2.6 D-Dimer: 997.99  CXR 10/31: Stable unchanged bilateral airspace lung opacities consistent with multifocal pneumonia Objective   Blood pressure (!) 158/103, pulse 68, temperature (!) 96.6 F (35.9 C), temperature source Oral, resp. rate (!) 24, height 5\' 11"  (1.803 m), weight 123.6 kg, SpO2 96 %.    FiO2 (%):  [50 %-65 %] 55 %   Intake/Output Summary (Last 24 hours) at 12/26/2019 2142 Last data filed at 12/26/2019 1835 Gross per 24 hour  Intake 2492.52 ml  Output 1600 ml  Net 892.52 ml   Filed Weights   12/22/19 2100 12/23/19 0500 12/24/19 0500  Weight: 123.6 kg 123.6 kg 123.6 kg    Examination: General: Adult male, critically/chronically ill, lying in bed, NAD HEENT: MM pink/moist, anicteric, atraumatic, neck supple Neuro: A&O x 4, following commands, PERRL +3, MAE- generalized weakness, numbness LLE/LUE CV: s1s2 RRR, SB in 50's on monitor, no r/m/g Pulm: Regular, non labored on HHFNC at 55%/55L, breath sounds diminished throughout GI: soft, rounded, non tender, bs x 4 GU: external cath in place with clear yellow urine Skin: intact, no rashes/lesions noted Extremities: warm/dry, pulses + 2 R/P, trace edema noted   Resolved Hospital Problem list     Assessment & Plan:  Acute Hypoxic Respiratory Failure secondary to COVID-19  Completed Remdesivir 4 doses, baricitinib for 3 doses - HHFNC 55%, 55 L wean as tolerated - supplemental O2 PRN to maintain SpO2 > 88% - continue Decadron 6 mg - encourage self-prone - aggressive pulmonary hygiene - continuous cardiac & SpO2 monitoring -  intermittent CXR, ABG PRN  Acute Kidney Injury with Hyperkalemia  Metabolic Acidosis  Hyponatremia- improving 134 s/p IVF & improved PO intake PMHx Chronic Kidney Disease stage III Baseline Cr. 2.37, GFR 30 on 02/08/2019. Renal US consistent with CKD. Hyperkalemia improved s/p lokelma, Cr improved, currently below  baseline at 2.25 - Strict I/O's: alert provider if UOP < 0.5 mL/kg/hr - Daily BMP, replace electrolytes PRN - Avoid nephrotoxic agents as able, ensure adequate renal perfusion - Nephrology following, appreciate input - home medications: lasix, entresto on hold due to renal function  Acute on Chronic combined heart failure PMHx: HTN, HLD, PAF, Alcoholic cardiomyopathy- EF 45% - continuous cardiac monitoring - continue carvedilol - continue hydralazine PRN to maintain BP < 160/110 - started amlodipine 5 mg daily - continued home eliquis, atorvastatin, amiodarone - home medications: lasix & entresto on hold due to renal function  Hyperglycemia secondary to steroid administration Suspected undiagnosed T2DM - A1C 7.7 Patient receiving Decadron for COVID-19 treatment, A1C was 7.7, not prescribed DM medications outpatient. - Diabetes Coordinator consulted, appreciate input - AC, HS monitoring with Resistant SSI & 3 units standing meal coverage - outpatient f/u with provider regarding medication needs once discharged  Hypothyroidism -continue home synthroid  Best practice:  Diet: soft Pain/Anxiety/Delirium protocol (if indicated): Norco Q 6 VAP protocol (if indicated): N/A DVT prophylaxis: eliquis GI prophylaxis: n/a Glucose control: AC, HS with SSI resistant scale Mobility: PT, mobilize as tolerated Code Status: FULL Family Communication: patient updated bedside Disposition: ICU  Labs   CBC: Recent Labs  Lab 12/22/19 0849 12/23/19 0351 12/24/19 0142 12/25/19 0529 12/26/19 0711  WBC 7.1 4.5 9.4 9.8 11.5*  NEUTROABS 5.9 3.5  --   --   --   HGB 16.4 14.8 13.7 14.0 13.7  HCT 49.5 43.9 40.9 40.8 40.5  MCV 86.7 86.2 85.4 84.5 86.2  PLT 223 210 305 328 867    Basic Metabolic Panel: Recent Labs  Lab 12/22/19 0849 12/22/19 0849 12/23/19 0351 12/23/19 1803 12/24/19 0142 12/24/19 0845 12/25/19 0529 12/26/19 0711  NA 134*   < > 131*  --  129* 131* 133* 134*  K 4.6   <  > 5.4* 4.8 4.5  --  4.8 4.9  CL 100  --  98  --  97*  --  103 107  CO2 19*  --  19*  --  20*  --  20* 18*  GLUCOSE 118*  --  205*  --  176*  --  136* 112*  BUN 60*  --  71*  --  72*  --  66* 62*  CREATININE 3.40*  --  3.59*  --  2.80*  --  2.49* 2.25*  CALCIUM 8.5*  --  8.0*  --  8.0*  --  7.8* 8.1*  MG  --   --   --  2.4 2.3  --  2.5* 2.6*  PHOS  --   --   --  4.0 4.2  --  3.8 3.6   < > = values in this interval not displayed.   GFR: Estimated Creatinine Clearance: 49.6 mL/min (A) (by C-G formula based on SCr of 2.25 mg/dL (H)). Recent Labs  Lab 12/22/19 0849 12/22/19 0849 12/22/19 1056 12/22/19 2151 12/23/19 0351 12/24/19 0142 12/25/19 0529 12/26/19 0711  PROCALCITON  --   --   --  1.22 1.16 0.99  --   --   WBC 7.1   < >  --   --  4.5 9.4 9.8 11.5*  LATICACIDVEN 3.0*  --  2.7*  --   --  1.8  --   --    < > = values in this interval not displayed.    Liver Function Tests: Recent Labs  Lab 12/22/19 0849 12/23/19 0351 12/24/19 0142 12/25/19 0529 12/26/19 0711  AST 88* 61*  --   --   --   ALT 39 39  --   --   --   ALKPHOS 43 39  --   --   --   BILITOT 1.3* 1.0  --   --   --   PROT 7.3 6.6  --   --   --   ALBUMIN 3.1* 2.6* 2.6* 2.6* 2.7*   No results for input(s): LIPASE, AMYLASE in the last 168 hours. No results for input(s): AMMONIA in the last 168 hours.  ABG    Component Value Date/Time   PHART 7.37 12/22/2019 1239   PCO2ART 33 12/22/2019 1239   PO2ART 107 12/22/2019 1239   HCO3 19.1 (L) 12/22/2019 1239   ACIDBASEDEF 5.3 (H) 12/22/2019 1239   O2SAT 98.0 12/22/2019 1239     Coagulation Profile: Recent Labs  Lab 12/22/19 0849  INR 1.1    Cardiac Enzymes: No results for input(s): CKTOTAL, CKMB, CKMBINDEX, TROPONINI in the last 168 hours.  HbA1C: Hgb A1c MFr Bld  Date/Time Value Ref Range Status  12/23/2019 03:51 AM 7.7 (H) 4.8 - 5.6 % Final    Comment:    (NOTE) Pre diabetes:          5.7%-6.4%  Diabetes:              >6.4%  Glycemic control  for   <7.0% adults with diabetes   10/08/2015 04:22 AM 6.6 (H) 4.0 - 6.0 % Final    CBG: Recent Labs  Lab 12/25/19 2155 12/26/19 0720 12/26/19 1115 12/26/19 1608 12/26/19 2140  GLUCAP 188* 103* 159* 214* 188*     Critical care time: 35 minutes     Domingo Pulse Rust-[place], AGACNP-BC [facility]    Please see Amion for pager details.

## 2019-12-26 NOTE — Evaluation (Signed)
Physical Therapy Evaluation Patient Details Name: Hector [name] MRN: [id number] DOB: 10-01-1965 Today's Date: 12/26/2019   History of Present Illness  presented to ER secondary to progressive SOB, cough, weakness; admitted for management of acute respiratory failure with ARDS related to COVID-19.  Hospital course additionally significant for onset of L UE > LE numbness, coordination deficits; head CT negative, patient refusing MRI per chart.  Clinical Impression  Patient resting in bed with nurse at bedside upon arrival to room.  Alert and oriented to basic information; follows commands and agreeable to session with min encouragement from therapist.  Denies pain at this time.  Currently on heated HFNC at 55L, 60% FiO2 for respiratory support.  Demonstrates L UE > LE weakness, ataxia and coordination deficits; moderate paresthesia endorsed throughout L UE and from L hip to knee.  Currently requiring min assist for bed mobility; mod assist for sit/stand, standing balance and lateral stepping edge of bed (5') with RW.  Demonstrates Mild ataxia, inconsistent L LE foot placement; limited balance reactions, limited activity tolerance.  Does require RW and +1 at all times; incresaed time/effort required to place/maintain L UE grasp on RW. Would benefit from skilled PT to address above deficits and promote optimal return to PLOF.; recommend transition to acute inpatient rehab upon discharge for high-intensity, post-acute rehab services. If discharge facilitated prior to discontinuation of isolation precautions, will need transition to STR.      Follow Up Recommendations CIR (if able to progress from airborne/contact)    Equipment Recommendations  Rolling walker with 5" wheels;3in1 (PT)    Recommendations for Other Services       Precautions / Restrictions Precautions Precautions: Fall Restrictions Weight Bearing Restrictions: No      Mobility  Bed Mobility Overal bed mobility: Needs  Assistance Bed Mobility: Supine to Sit;Sit to Supine     Supine to sit: Min assist Sit to supine: Min assist        Transfers Overall transfer level: Needs assistance Equipment used: Rolling walker (2 wheeled) Transfers: Sit to/from Stand Sit to Stand: Mod assist         General transfer comment: assist for lift off, stabilization in all planes; does endorse mild dizziness with transition to upright, requiring return to sitting position  Ambulation/Gait Ambulation/Gait assistance: Mod assist Gait Distance (Feet): 5 Feet Assistive device: Rolling walker (2 wheeled)       General Gait Details: lateral stepping edge of bed, mod assist for balance. Mild ataxia, inconsistent L LE foot placement; limited balance reactions, limited activity tolerance.  Does require RW and +1 at all times; incresaed time/effort required to place/maintain L UE grasp on RW.  Stairs            Wheelchair Mobility    Modified Rankin (Stroke Patients Only)       Balance Overall balance assessment: Needs assistance Sitting-balance support: No upper extremity supported;Feet supported Sitting balance-Leahy Scale: Good     Standing balance support: Bilateral upper extremity supported Standing balance-Leahy Scale: Poor                               Pertinent Vitals/Pain Pain Assessment: No/denies pain    Home Living Family/patient expects to be discharged to:: Private residence Living Arrangements: Parent Available Help at Discharge: Family Type of Home: House Home Access: Stairs to enter Entrance Stairs-Rails: Right;Left;Can reach both Entrance Stairs-Number of Steps: 3 Home Layout: One level Home Equipment: Kasandra Knudsen -  single point;Walker - 2 wheels      Prior Function Level of Independence: Independent         Comments: Indep with ADLs, household and community mobilization; no home O2; denies fall history.  Does endorse recent use of assist device related to current  medical/functional decline.     Hand Dominance   Dominant Hand: Right    Extremity/Trunk Assessment   Upper Extremity Assessment Upper Extremity Assessment:  (R UE grossly 4+/5 throughout;  L shoulder and elbow 4-/5, wrist and hand 3-/5.  Noted motor control deficits in L hand, significant ataxia with open-chain movement.  Does endorse paresthesia throughout L UE)    Lower Extremity Assessment Lower Extremity Assessment:  (grossly 4-/5 throughout; does endorse paresthesia L hip to knee)       Communication   Communication: No difficulties  Cognition Arousal/Alertness: Awake/alert Behavior During Therapy: WFL for tasks assessed/performed Overall Cognitive Status: Within Functional Limits for tasks assessed                                        General Comments      Exercises Other Exercises Other Exercises: Educated in role of PT, progression to upright and mobility efforts; discussed importance of forced-use and awareness of L UE to facilitate improved motor control/strength.  Patient voiced undersatnding. Other Exercises: Transitioned to chair position in bed and positioned to comfort, L UE supported on pillow.  Good tolerance for position, improved alertness and affect noted once upright.   Assessment/Plan    PT Assessment Patient needs continued PT services  PT Problem List Decreased strength;Decreased range of motion;Decreased activity tolerance;Decreased balance;Decreased mobility;Decreased coordination;Decreased knowledge of use of DME;Decreased safety awareness;Decreased knowledge of precautions;Cardiopulmonary status limiting activity;Impaired sensation;Obesity       PT Treatment Interventions DME instruction;Gait training;Stair training;Functional mobility training;Therapeutic activities;Therapeutic exercise;Balance training;Patient/family education    PT Goals (Current goals can be found in the Care Plan section)  Acute Rehab PT Goals Patient  Stated Goal: to get my strength back PT Goal Formulation: With patient Time For Goal Achievement: 01/09/20 Potential to Achieve Goals: Good    Frequency Min 2X/week   Barriers to discharge Decreased caregiver support      Co-evaluation               AM-PAC PT "6 Clicks" Mobility  Outcome Measure Help needed turning from your back to your side while in a flat bed without using bedrails?: A Little Help needed moving from lying on your back to sitting on the side of a flat bed without using bedrails?: A Little Help needed moving to and from a bed to a chair (including a wheelchair)?: A Lot Help needed standing up from a chair using your arms (e.g., wheelchair or bedside chair)?: A Lot Help needed to walk in hospital room?: A Lot Help needed climbing 3-5 steps with a railing? : A Lot 6 Click Score: 14    End of Session Equipment Utilized During Treatment: Oxygen Activity Tolerance: Patient tolerated treatment well Patient left: in bed;with call bell/phone within reach Nurse Communication: Mobility status PT Visit Diagnosis: Muscle weakness (generalized) (M62.81);Difficulty in walking, not elsewhere classified (R26.2)    Time: 9629-5284 PT Time Calculation (min) (ACUTE ONLY): 29 min   Charges:   PT Evaluation $PT Eval Moderate Complexity: 1 Mod PT Treatments $Therapeutic Activity: 8-22 mins        Antonin Meininger H.  Owens Shark, PT, DPT, NCS 12/26/19, 11:12 AM [phone]

## 2019-12-26 NOTE — Progress Notes (Signed)
Inpatient Rehab Admissions Coordinator Note:   Per PT recommendations, pt was screened for CIR candidacy by Shann Medal, PT, DPT.  Patients are eligible to be considered for admit to the [facility] when cleared from airborne precautions by Acute MD, or Infectious Disease MD.  Otherwise, they will need to be >20 days from their positive test with recovery/improvement in symptoms or 2 negative tests.  Note pt positive on 10/29 with consideration for removal of precautions currently 11/20.  Will follow along and request a consult order closer to that time if pt remains in house.    Shann Medal, PT, DPT [phone] 12/26/19 12:16 PM

## 2019-12-26 NOTE — Progress Notes (Signed)
PHARMACY CONSULT NOTE  Pharmacy Consult for Electrolyte Monitoring and Replacement   Recent Labs: Potassium (mmol/L)  Date Value  12/26/2019 4.9  03/08/2014 3.9   Magnesium (mg/dL)  Date Value  12/25/2019 2.5 (H)   Calcium (mg/dL)  Date Value  12/26/2019 8.1 (L)   Calcium, Total (mg/dL)  Date Value  03/08/2014 7.9 (L)   Albumin (g/dL)  Date Value  12/26/2019 2.7 (L)  10/11/2018 4.2  03/07/2014 3.4   Phosphorus (mg/dL)  Date Value  12/26/2019 3.6   Sodium (mmol/L)  Date Value  12/26/2019 134 (L)  10/11/2018 141  03/08/2014 135 (L)   Corrected Ca: 9.1 mg/dL  Assessment: 54 y/o M with medical history including atrial fibrillation on apixaban, CHF, CKD, alcohol abuse who is admitted with severe COVID-19 pneumonia with ARDS. Admission further complicated by AKI which is improving.  Goal of Therapy:  Potassium 4.0 - 5.1 mmol/L Magnesium 2.0 - 2.4 mg/dL All Other Electrolytes WNL  Plan:   No electrolyte replacement warranted today  Hyponatremia: improving with 0.9% NaCl @ 75 mL/hr  Re-check electrolytes in AM  Taylia Berber B Adonis Ryther 12/26/2019 8:16 AM

## 2019-12-26 NOTE — Consult Note (Signed)
ANTICOAGULATION CONSULT NOTE   Pharmacy Consult for Heparin Infusion Indication: atrial fibrillation  Patient Measurements: Height: 5\' 11"  (180.3 cm) Weight: 123.6 kg (272 lb 7.8 oz) IBW/kg (Calculated) : 75.3 Heparin Dosing Weight: 104.8 kg  Labs: Recent Labs    12/24/19 0142 12/24/19 0449 12/25/19 0529 12/25/19 1455 12/26/19 0012 12/26/19 0711  HGB 13.7   < > 14.0  --   --  13.7  HCT 40.9  --  40.8  --   --  40.5  PLT 305  --  328  --   --  335  APTT 101*   < >  --  121* 107* 98*  HEPARINUNFRC 1.28*  --  0.99*  --   --   --   CREATININE 2.80*  --  2.49*  --   --  2.25*   < > = values in this interval not displayed.    Estimated Creatinine Clearance: 49.6 mL/min (A) (by C-G formula based on SCr of 2.25 mg/dL (H)).   Medical History: Past Medical History:  Diagnosis Date  . Alcohol abuse   . 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 12/2007 MV: EF 28%, no isch;  b. 8/12 Echo: EF 25-35%; c. 02/2014 Echo: EF 20-25%; d. 12/2014 Cath: minimal CAD; e. 01/2015 Echo: EF 50-55%;  d. 05/2016 Echo: EF 30-35%, diff HK, gr2 DD; e. 11/2016 Echo: EF 45-50%, diff HK.  . Chronic combined systolic (congestive) and diastolic (congestive) heart failure ([place])    a. 05/2016 Most recent Echo: EF 30-35%, diff HK, Gr2 DD, mild MR, mod dil LA; b. 11/2016 Echo: EF 45-50%, diff HK.  . CKD (chronic kidney disease), stage III ([place])   . Elevated troponin    Chronically elevated. - level was 0.17-0.10 during recent admission  . Essential hypertension   . GI bleed 11/2013  . Hyperlipidemia   . Pancreatitis   . Paroxysmal A-fib ([place])    a. new onset s/p unsuccessful TEE/DCCV on 08/16/2014; b. on amio/eliquis (CHA2DS2VASc = 2-3); c. reports 1-2 hrs of afib ~ q61mos.  . Paroxysmal atrial flutter ([place])    a. new onset 07/2014; b. s/p unsuccessful TEE/DCCV 08/16/2014; c. on apixaban.  . Sleep-disordered breathing    Has yet to have a sleep study    Medications:  Apixaban 5 mg BID for atrial fibrillation  (last dose 10/29 at 1839)  Assessment: Patient is a 54 y/o M with medical history including atrial fibrillation on apixaban, CHF, CKD, alcohol abuse who is admitted with severe COVID-19 pneumonia with ARDS. Admission further complicated by AKI. Pharmacy has been consulted to initiate heparin infusion in this patient due to concerns about clearance of apixaban given renal dysfunction.   Per chart, patient is currently in NSR. Baseline aPTT 31, INR 1.1, anti-Xa 1.77. H&H, platelets within normal limits. D-dimer elevated.   Heparin Course: 10/31 initiation: 1450 units/hr 10/31 0449 aPTT 123s: rate decreased to 1200 units/hr 11/1 1455 aPTT 121s: rate decreased to 1000 units/hr 11/2 0012 aPTT 107s: rate decreased to 900 units/hr 11/2 0711 aPTT 98s: continue rate of 900 units/hr  Goal of Therapy:  Heparin level 0.3-0.7 units/ml aPTT 66 - 102 seconds Monitor platelets by anticoagulation protocol: Yes   Plan:   aPTT therapeutic x 1:  Continue infusion rate at 900 units/hr  Recheck aPTT and HL in 6 hrs  Continue to follow aPTT given apixaban interference with anti-Xa level. Can switch over to anti-Xa monitoring when correlation is established  Daily HL, aPTT, CBC per protocol  [name] 12/26/2019,8:13 AM

## 2019-12-27 LAB — CBC WITH DIFFERENTIAL/PLATELET
Abs Immature Granulocytes: 1.45 10*3/uL — ABNORMAL HIGH (ref 0.00–0.07)
Basophils Absolute: 0.1 10*3/uL (ref 0.0–0.1)
Basophils Relative: 1 %
Eosinophils Absolute: 0 10*3/uL (ref 0.0–0.5)
Eosinophils Relative: 0 %
HCT: 41.7 % (ref 39.0–52.0)
Hemoglobin: 13.8 g/dL (ref 13.0–17.0)
Immature Granulocytes: 11 %
Lymphocytes Relative: 8 %
Lymphs Abs: 1 10*3/uL (ref 0.7–4.0)
MCH: 28.3 pg (ref 26.0–34.0)
MCHC: 33.1 g/dL (ref 30.0–36.0)
MCV: 85.5 fL (ref 80.0–100.0)
Monocytes Absolute: 0.8 10*3/uL (ref 0.1–1.0)
Monocytes Relative: 6 %
Neutro Abs: 9.9 10*3/uL — ABNORMAL HIGH (ref 1.7–7.7)
Neutrophils Relative %: 74 %
Platelets: 377 10*3/uL (ref 150–400)
RBC: 4.88 MIL/uL (ref 4.22–5.81)
RDW: 13.7 % (ref 11.5–15.5)
Smear Review: NORMAL
WBC: 13.4 10*3/uL — ABNORMAL HIGH (ref 4.0–10.5)
nRBC: 0 % (ref 0.0–0.2)

## 2019-12-27 LAB — GLUCOSE, CAPILLARY
Glucose-Capillary: 120 mg/dL — ABNORMAL HIGH (ref 70–99)
Glucose-Capillary: 154 mg/dL — ABNORMAL HIGH (ref 70–99)
Glucose-Capillary: 186 mg/dL — ABNORMAL HIGH (ref 70–99)
Glucose-Capillary: 240 mg/dL — ABNORMAL HIGH (ref 70–99)

## 2019-12-27 LAB — BASIC METABOLIC PANEL
Anion gap: 10 (ref 5–15)
BUN: 57 mg/dL — ABNORMAL HIGH (ref 6–20)
CO2: 18 mmol/L — ABNORMAL LOW (ref 22–32)
Calcium: 8.7 mg/dL — ABNORMAL LOW (ref 8.9–10.3)
Chloride: 108 mmol/L (ref 98–111)
Creatinine, Ser: 2.18 mg/dL — ABNORMAL HIGH (ref 0.61–1.24)
GFR, Estimated: 35 mL/min — ABNORMAL LOW (ref >60)
Glucose, Bld: 127 mg/dL — ABNORMAL HIGH (ref 70–99)
Potassium: 5.1 mmol/L (ref 3.5–5.1)
Sodium: 136 mmol/L (ref 135–145)

## 2019-12-27 LAB — MAGNESIUM: Magnesium: 2.4 mg/dL (ref 1.7–2.4)

## 2019-12-27 LAB — PHOSPHORUS: Phosphorus: 3.8 mg/dL (ref 2.5–4.6)

## 2019-12-27 LAB — CULTURE, BLOOD (ROUTINE X 2)
Culture: NO GROWTH
Culture: NO GROWTH
Special Requests: ADEQUATE
Special Requests: ADEQUATE

## 2019-12-27 LAB — C-REACTIVE PROTEIN: CRP: 1.9 mg/dL — ABNORMAL HIGH (ref <1.0)

## 2019-12-27 LAB — D-DIMER, QUANTITATIVE: D-Dimer, Quant: 1.11 ug/mL-FEU — ABNORMAL HIGH (ref 0.00–0.50)

## 2019-12-27 LAB — FERRITIN: Ferritin: 785 ng/mL — ABNORMAL HIGH (ref 24–336)

## 2019-12-27 MED ORDER — INSULIN ASPART 100 UNIT/ML ~~LOC~~ SOLN
3.0000 [IU] | Freq: Three times a day (TID) | SUBCUTANEOUS | Status: DC
  Administered 2019-12-27 – 2020-01-17 (×30): 3 [IU] via SUBCUTANEOUS
  Filled 2019-12-27 (×22): qty 1

## 2019-12-27 MED ORDER — FUROSEMIDE 20 MG PO TABS
40.0000 mg | ORAL_TABLET | Freq: Every day | ORAL | Status: DC
  Administered 2019-12-27 – 2019-12-30 (×4): 40 mg via ORAL
  Filled 2019-12-27 (×4): qty 2

## 2019-12-27 NOTE — Progress Notes (Signed)
[facility]  ROUNDING NOTE   Subjective:   UOP 1735mL.  Creatinine 2.18 (2.25) (2.49) (2.8) Na 136 (134) (133) (131)  Furosemide restarted  Objective:  Vital signs in last 24 hours:  Temp:  [96.6 F (35.9 C)-97.4 F (36.3 C)] 97 F (36.1 C) (11/03 0300) Pulse Rate:  [52-75] 54 (11/03 0600) Resp:  [19-30] 23 (11/03 0600) BP: (131-170)/(76-122) 157/103 (11/03 0600) SpO2:  [92 %-100 %] 97 % (11/03 0735) FiO2 (%):  [50 %-60 %] 50 % (11/03 0735)  Weight change:  Filed Weights   12/22/19 2100 12/23/19 0500 12/24/19 0500  Weight: 123.6 kg 123.6 kg 123.6 kg    Intake/Output: I/O last 3 completed shifts: In: 2902.2 [P.O.:1480; I.V.:1239.1; IV Piggyback:183.1] Out: 2400 [Urine:2400]   Intake/Output this shift:  No intake/output data recorded.  Physical Exam: General: Critically ill  Head: Normocephalic, atraumatic. Moist oral mucosal membranes  Eyes: Anicteric, PERRL  Neck: Supple, trachea midline  Lungs:  +coarse breath sounds, HFNC 50%  Heart: Regular rate and rhythm  Abdomen:  Soft, nontender  Extremities: No peripheral edema.  Neurologic: Nonfocal, moving all four extremities  Skin: No lesions        Basic Metabolic Panel: Recent Labs  Lab 12/23/19 0351 12/23/19 0351 12/23/19 1803 12/24/19 0142 12/24/19 0142 12/24/19 0845 12/25/19 0529 12/26/19 0711 12/27/19 0458  NA 131*   < >  --  129*  --  131* 133* 134* 136  K 5.4*   < > 4.8 4.5  --   --  4.8 4.9 5.1  CL 98  --   --  97*  --   --  103 107 108  CO2 19*  --   --  20*  --   --  20* 18* 18*  GLUCOSE 205*  --   --  176*  --   --  136* 112* 127*  BUN 71*  --   --  72*  --   --  66* 62* 57*  CREATININE 3.59*  --   --  2.80*  --   --  2.49* 2.25* 2.18*  CALCIUM 8.0*   < >  --  8.0*   < >  --  7.8* 8.1* 8.7*  MG  --   --  2.4 2.3  --   --  2.5* 2.6* 2.4  PHOS  --   --  4.0 4.2  --   --  3.8 3.6 3.8   < > = values in this interval not displayed.    Liver Function Tests: Recent Labs  Lab  12/22/19 0849 12/23/19 0351 12/24/19 0142 12/25/19 0529 12/26/19 0711  AST 88* 61*  --   --   --   ALT 39 39  --   --   --   ALKPHOS 43 39  --   --   --   BILITOT 1.3* 1.0  --   --   --   PROT 7.3 6.6  --   --   --   ALBUMIN 3.1* 2.6* 2.6* 2.6* 2.7*   No results for input(s): LIPASE, AMYLASE in the last 168 hours. No results for input(s): AMMONIA in the last 168 hours.  CBC: Recent Labs  Lab 12/22/19 0849 12/22/19 0849 12/23/19 0351 12/24/19 0142 12/25/19 0529 12/26/19 0711 12/27/19 0458  WBC 7.1   < > 4.5 9.4 9.8 11.5* 13.4*  NEUTROABS 5.9  --  3.5  --   --   --  9.9*  HGB 16.4   < >  14.8 13.7 14.0 13.7 13.8  HCT 49.5   < > 43.9 40.9 40.8 40.5 41.7  MCV 86.7   < > 86.2 85.4 84.5 86.2 85.5  PLT 223   < > 210 305 328 335 377   < > = values in this interval not displayed.    Cardiac Enzymes: No results for input(s): CKTOTAL, CKMB, CKMBINDEX, TROPONINI in the last 168 hours.  BNP: Invalid input(s): POCBNP  CBG: Recent Labs  Lab 12/26/19 1115 12/26/19 1608 12/26/19 2140 12/27/19 0739 12/27/19 1130  GLUCAP 159* 214* 188* 120* 154*    Microbiology: Results for orders placed or performed during the hospital encounter of 12/22/19  Blood Culture (routine x 2)     Status: None   Collection Time: 12/22/19  8:49 AM   Specimen: BLOOD  Result Value Ref Range Status   Specimen Description BLOOD RT [facility]  Final   Special Requests   Final    BOTTLES DRAWN AEROBIC AND ANAEROBIC Blood Culture adequate volume   Culture   Final    NO GROWTH 5 DAYS Performed at [facility], [address] [place], [place] [address]    Report Status 12/27/2019 FINAL  Final  Blood Culture (routine x 2)     Status: None   Collection Time: 12/22/19  8:49 AM   Specimen: BLOOD  Result Value Ref Range Status   Specimen Description BLOOD RT HAND  Final   Special Requests   Final    BOTTLES DRAWN AEROBIC AND ANAEROBIC Blood Culture adequate volume   Culture   Final    NO GROWTH 5  DAYS Performed at [facility], [address] [place], [place] [address]    Report Status 12/27/2019 FINAL  Final  Urine culture     Status: None   Collection Time: 12/22/19  8:49 AM   Specimen: Urine, Random  Result Value Ref Range Status   Specimen Description   Final    URINE, RANDOM Performed at [facility], [address], [place], [place] [address]    Special Requests   Final    NONE Performed at [facility], [address], [place], [place] [address]    Culture   Final    NO GROWTH Performed at [facility], [place] [address] [place], [place] [address]    Report Status 12/23/2019 FINAL  Final  Respiratory Panel by RT PCR (Flu A&B, Covid) - Nasopharyngeal Swab     Status: Abnormal   Collection Time: 12/22/19  8:49 AM   Specimen: Nasopharyngeal Swab  Result Value Ref Range Status   SARS Coronavirus 2 by RT PCR POSITIVE (A) NEGATIVE Final    Comment: RESULT CALLED TO, READ BACK BY AND VERIFIED WITH: KENDALL FLANNIGAN AT 7341 ON 12/22/2019 [place]. (NOTE) SARS-CoV-2 target nucleic acids are DETECTED.  SARS-CoV-2 RNA is generally detectable in upper respiratory specimens  during the acute phase of infection. Positive results are indicative of the presence of the identified virus, but do not rule out bacterial infection or co-infection with other pathogens not detected by the test. Clinical correlation with patient history and other diagnostic information is necessary to determine patient infection status. The expected result is Negative.  Fact Sheet for Patients:  PinkCheek.be  Fact Sheet for Healthcare Providers: GravelBags.it  This test is not yet approved or cleared by the [place] FDA and  has been authorized for detection and/or diagnosis of SARS-CoV-2 by FDA under an Emergency Use Authorization (EUA).  This  EUA will remain in effect (meaning this te st can be used)  for the duration of  the COVID-19 declaration under Section 564(b)(1) of the Act, 21 U.S.C. section 360bbb-3(b)(1), unless the authorization is terminated or revoked sooner.      Influenza A by PCR NEGATIVE NEGATIVE Final   Influenza B by PCR NEGATIVE NEGATIVE Final    Comment: (NOTE) The Xpert Xpress SARS-CoV-2/FLU/RSV assay is intended as an aid in  the diagnosis of influenza from Nasopharyngeal swab specimens and  should not be used as a sole basis for treatment. Nasal washings and  aspirates are unacceptable for Xpert Xpress SARS-CoV-2/FLU/RSV  testing.  Fact Sheet for Patients: PinkCheek.be  Fact Sheet for Healthcare Providers: GravelBags.it  This test is not yet approved or cleared by the [place] FDA and  has been authorized for detection and/or diagnosis of SARS-CoV-2 by  FDA under an Emergency Use Authorization (EUA). This EUA will remain  in effect (meaning this test can be used) for the duration of the  Covid-19 declaration under Section 564(b)(1) of the Act, 21  U.S.C. section 360bbb-3(b)(1), unless the authorization is  terminated or revoked. Performed at [facility], [address] [place], [place] [address]   MRSA PCR Screening     Status: None   Collection Time: 12/22/19  9:16 PM   Specimen: Nasal Mucosa; Nasopharyngeal  Result Value Ref Range Status   MRSA by PCR NEGATIVE NEGATIVE Final    Comment:        The GeneXpert MRSA Assay (FDA approved for NASAL specimens only), is one component of a comprehensive MRSA colonization surveillance program. It is not intended to diagnose MRSA infection nor to guide or monitor treatment for MRSA infections. Performed at [facility], [address] [place], [place] [address]     Coagulation Studies: No results for input(s): LABPROT, INR in the last 72 hours.  Urinalysis: No results for input(s): COLORURINE, LABSPEC, PHURINE,  GLUCOSEU, HGBUR, BILIRUBINUR, KETONESUR, PROTEINUR, UROBILINOGEN, NITRITE, LEUKOCYTESUR in the last 72 hours.  Invalid input(s): APPERANCEUR    Imaging: No results found.   Medications:    . allopurinol  100 mg Oral Daily  . amiodarone  200 mg Oral Daily  . amLODipine  5 mg Oral Daily  . apixaban  5 mg Oral BID  . atorvastatin  80 mg Oral QHS  . carvedilol  3.125 mg Oral BID WC  . Chlorhexidine Gluconate Cloth  6 each Topical Daily  . dexamethasone (DECADRON) injection  6 mg Intravenous Q24H  . furosemide  40 mg Oral Daily  . insulin aspart  0-20 Units Subcutaneous TID AC & HS  . insulin aspart  3 Units Subcutaneous TID with meals  . levothyroxine  100 mcg Oral QAC breakfast  . traZODone  50 mg Oral QHS   docusate sodium, hydrALAZINE, HYDROcodone-acetaminophen, polyethylene glycol  Assessment/ Plan:  [name] is a 54 y.o. black male with atrial fibrillation, hyperlipidemia, congestive heart failure, alcohol abuse, hypothyroidism who was admitted to [facility] on 12/22/2019 for Hypoxia [R09.02] Pneumonia due to COVID-19 virus [U07.1, J12.82] COVID-19 [U07.1]   1. Acute kidney injury with hyperkalemia, metabolic acidosis and hyponatremia on chronic kidney disease stage IIIB. Baseline creatinine of 2.37, GFR of 30 on 02/08/2019. Followed by [facility].   Creatinine back to baseline Furosemide restarted - holding entresto  - Encourage PO intake  2. Hypertension: with urgency on admission. Current regimen of carvedilol.  Elevated at 148/102 - IV hydralazine PRN - If  continues to be elevated, will add another agent such as amlodipine   LOS: 5 Erine [place] 11/3/202111:54 AM

## 2019-12-27 NOTE — Progress Notes (Signed)
PHARMACY CONSULT NOTE  Pharmacy Consult for Electrolyte Monitoring and Replacement   Recent Labs: Potassium (mmol/L)  Date Value  12/27/2019 5.1  03/08/2014 3.9   Magnesium (mg/dL)  Date Value  12/27/2019 2.4   Calcium (mg/dL)  Date Value  12/27/2019 8.7 (L)   Calcium, Total (mg/dL)  Date Value  03/08/2014 7.9 (L)   Albumin (g/dL)  Date Value  12/26/2019 2.7 (L)  10/11/2018 4.2  03/07/2014 3.4   Phosphorus (mg/dL)  Date Value  12/27/2019 3.8   Sodium (mmol/L)  Date Value  12/27/2019 136  10/11/2018 141  03/08/2014 135 (L)   Corrected Ca: 9.1 mg/dL  Assessment: 54 y/o M with medical history including atrial fibrillation on apixaban, CHF, CKD, alcohol abuse who is admitted with severe COVID-19 pneumonia with ARDS.   Admission further complicated by AKI which is improving. Holding home Entresto and furosemide for now. Potassium remains at ULN.   Goal of Therapy:  Potassium 4.0 - 5.1 mmol/L Magnesium 2.0 - 2.4 mg/dL All Other Electrolytes WNL  Plan:   No electrolyte replacement warranted today  Re-check electrolytes in AM  [name] 12/27/2019 7:35 AM

## 2019-12-28 ENCOUNTER — Inpatient Hospital Stay: Payer: Medicaid Other

## 2019-12-28 DIAGNOSIS — U071 COVID-19: Secondary | ICD-10-CM | POA: Diagnosis not present

## 2019-12-28 DIAGNOSIS — J9601 Acute respiratory failure with hypoxia: Secondary | ICD-10-CM | POA: Diagnosis not present

## 2019-12-28 DIAGNOSIS — I5032 Chronic diastolic (congestive) heart failure: Secondary | ICD-10-CM | POA: Diagnosis not present

## 2019-12-28 DIAGNOSIS — J1282 Pneumonia due to coronavirus disease 2019: Secondary | ICD-10-CM | POA: Diagnosis not present

## 2019-12-28 LAB — MAGNESIUM: Magnesium: 2.1 mg/dL (ref 1.7–2.4)

## 2019-12-28 LAB — BASIC METABOLIC PANEL
Anion gap: 12 (ref 5–15)
BUN: 59 mg/dL — ABNORMAL HIGH (ref 6–20)
CO2: 18 mmol/L — ABNORMAL LOW (ref 22–32)
Calcium: 8.8 mg/dL — ABNORMAL LOW (ref 8.9–10.3)
Chloride: 104 mmol/L (ref 98–111)
Creatinine, Ser: 2.22 mg/dL — ABNORMAL HIGH (ref 0.61–1.24)
GFR, Estimated: 34 mL/min — ABNORMAL LOW (ref >60)
Glucose, Bld: 140 mg/dL — ABNORMAL HIGH (ref 70–99)
Potassium: 5.4 mmol/L — ABNORMAL HIGH (ref 3.5–5.1)
Sodium: 134 mmol/L — ABNORMAL LOW (ref 135–145)

## 2019-12-28 LAB — CBC WITH DIFFERENTIAL/PLATELET
Abs Immature Granulocytes: 2.08 10*3/uL — ABNORMAL HIGH (ref 0.00–0.07)
Basophils Absolute: 0 10*3/uL (ref 0.0–0.1)
Basophils Relative: 0 %
Eosinophils Absolute: 0 10*3/uL (ref 0.0–0.5)
Eosinophils Relative: 0 %
HCT: 46.7 % (ref 39.0–52.0)
Hemoglobin: 15.2 g/dL (ref 13.0–17.0)
Immature Granulocytes: 11 %
Lymphocytes Relative: 7 %
Lymphs Abs: 1.3 10*3/uL (ref 0.7–4.0)
MCH: 28.4 pg (ref 26.0–34.0)
MCHC: 32.5 g/dL (ref 30.0–36.0)
MCV: 87.1 fL (ref 80.0–100.0)
Monocytes Absolute: 1.2 10*3/uL — ABNORMAL HIGH (ref 0.1–1.0)
Monocytes Relative: 6 %
Neutro Abs: 13.9 10*3/uL — ABNORMAL HIGH (ref 1.7–7.7)
Neutrophils Relative %: 76 %
Platelets: 349 10*3/uL (ref 150–400)
RBC: 5.36 MIL/uL (ref 4.22–5.81)
RDW: 14.2 % (ref 11.5–15.5)
Smear Review: NORMAL
WBC: 18.5 10*3/uL — ABNORMAL HIGH (ref 4.0–10.5)
nRBC: 0.1 % (ref 0.0–0.2)

## 2019-12-28 LAB — GLUCOSE, CAPILLARY
Glucose-Capillary: 124 mg/dL — ABNORMAL HIGH (ref 70–99)
Glucose-Capillary: 146 mg/dL — ABNORMAL HIGH (ref 70–99)
Glucose-Capillary: 242 mg/dL — ABNORMAL HIGH (ref 70–99)
Glucose-Capillary: 168 mg/dL — ABNORMAL HIGH (ref 70–99)

## 2019-12-28 LAB — FIBRIN DERIVATIVES D-DIMER (ARMC ONLY): Fibrin derivatives D-dimer (ARMC): 872.66 ng/mL (FEU) — ABNORMAL HIGH (ref 0.00–499.00)

## 2019-12-28 LAB — PHOSPHORUS: Phosphorus: 4.3 mg/dL (ref 2.5–4.6)

## 2019-12-28 LAB — FERRITIN: Ferritin: 822 ng/mL — ABNORMAL HIGH (ref 24–336)

## 2019-12-28 LAB — C-REACTIVE PROTEIN: CRP: 0.8 mg/dL (ref <1.0)

## 2019-12-28 IMAGING — DX DG CHEST 1V PORT
1 series · 1 of 1 positions shown · non-contrast
Comparison: [DATE].

CLINICAL DATA: Shortness of breath.

EXAM:
PORTABLE CHEST 1 VIEW

[chest ap]
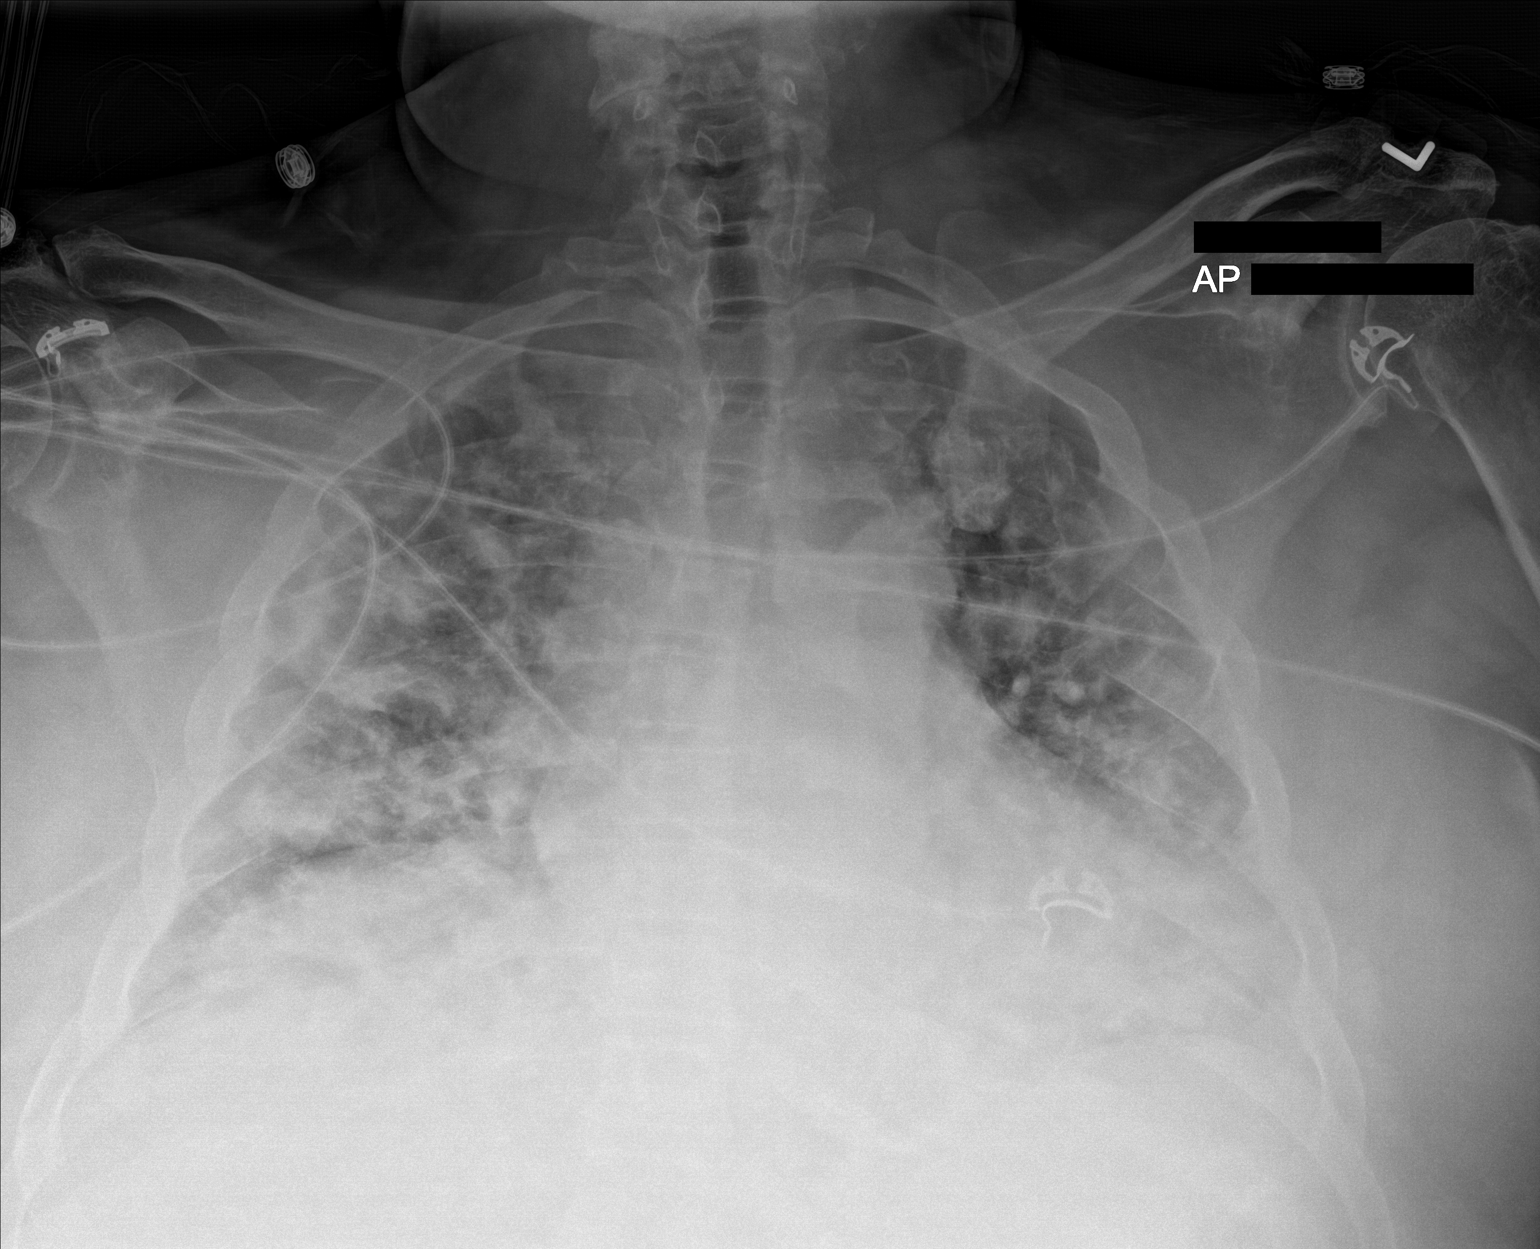

[1 of 1 positions shown; findings below may reference images not displayed]

FINDINGS: Stable cardiomegaly. Prominent diffuse bilateral pulmonary
infiltrates/edema again noted. Similar findings on prior exam. No
prominent pleural effusion. No pneumothorax.
IMPRESSION: Stable cardiomegaly. Prominent diffuse bilateral pulmonary
infiltrates/edema again noted. Similar findings on prior exam.

## 2019-12-28 MED ORDER — PIPERACILLIN-TAZOBACTAM 3.375 G IVPB
3.3750 g | Freq: Three times a day (TID) | INTRAVENOUS | Status: DC
  Administered 2019-12-28 – 2019-12-29 (×3): 3.375 g via INTRAVENOUS
  Filled 2019-12-28 (×3): qty 50

## 2019-12-28 MED ORDER — GUAIFENESIN-DM 100-10 MG/5ML PO SYRP
5.0000 mL | ORAL_SOLUTION | ORAL | Status: DC | PRN
  Administered 2019-12-28: 5 mL via ORAL
  Filled 2019-12-28: qty 5

## 2019-12-28 MED ORDER — SODIUM ZIRCONIUM CYCLOSILICATE 10 G PO PACK
10.0000 g | PACK | Freq: Every day | ORAL | Status: DC
  Administered 2019-12-28 – 2020-01-11 (×13): 10 g via ORAL
  Filled 2019-12-28 (×3): qty 1
  Filled 2019-12-28: qty 2
  Filled 2019-12-28: qty 1
  Filled 2019-12-28: qty 2
  Filled 2019-12-28 (×9): qty 1
  Filled 2019-12-28: qty 2

## 2019-12-28 MED ORDER — SODIUM BICARBONATE 650 MG PO TABS
1300.0000 mg | ORAL_TABLET | Freq: Two times a day (BID) | ORAL | Status: DC
  Administered 2019-12-28 – 2020-01-03 (×13): 1300 mg via ORAL
  Filled 2019-12-28 (×14): qty 2

## 2019-12-28 MED ORDER — SACUBITRIL-VALSARTAN 97-103 MG PO TABS
1.0000 | ORAL_TABLET | Freq: Two times a day (BID) | ORAL | Status: DC
  Filled 2019-12-28: qty 1

## 2019-12-28 NOTE — Progress Notes (Addendum)
Inpatient Diabetes Program Recommendations  AACE/ADA: New Consensus Statement on Inpatient Glycemic Control   Target Ranges:  Prepandial:   less than 140 mg/dL      Peak postprandial:   less than 180 mg/dL (1-2 hours)      Critically ill patients:  140 - 180 mg/dL   Results for Hector [name], Hector [name] (MRN [id number]) as of 12/28/2019 11:20  Ref. Range 12/27/2019 07:39 12/27/2019 11:30 12/27/2019 15:57 12/27/2019 21:09 12/28/2019 09:33  Glucose-Capillary Latest Ref Range: 70 - 99 mg/dL 120 (H) 154 (H) 186 (H) 240 (H) 124 (H)  Results for Hector [name], Hector [name] (MRN [id number]) as of 12/28/2019 11:20  Ref. Range 12/23/2019 03:51  Hemoglobin A1C Latest Ref Range: 4.8 - 5.6 % 7.7 (H)   Review of Glycemic Control  Diabetes history: NO Outpatient Diabetes medications: NA Current orders for Inpatient glycemic control: Novolog 0-20 units AC&HS; Decadron 6 mg Q24H  Inpatient Diabetes Program Recommendations:    Insulin: If steroids are continued as ordered, please consider increasing meal coverage to Novolog 5 units TID with meals if patient eats at least 50% of meals.  HbgA1C: A1C 7.7% on 12/23/19 indicating an average glucose of 174 mg/dl over the past 2-3 months.  NOTE: In reviewing the chart, noted patient does not have DM hx. Patient went to Emergency department on 10/30/19 for gouty arthritis and was prescribed 10 day Prednisone taper and was to follow up with PCP. Anticipate recent steroids have impacted A1C results. Would recommend patient follow up with PCP regarding glycemic control and to have A1C repeated in 3-4 months.  Called patient's cell phone but went to voicemail and called patient's room phone and the phone was answered and then hung up without anyone speaking. Communicated with [place], RN to ask patient about talking with diabetes coordinator. RN reports that patient is not receptive to talking with diabetes coordinator at this time.  Inpatient diabetes team will attempt to reach out to  patient at a later time regarding A1C and glycemic control.   Thanks, Barnie Alderman, RN, MSN, CDE Diabetes Coordinator Inpatient Diabetes Program [phone] (Team Pager from 8am to 5pm)

## 2019-12-28 NOTE — Progress Notes (Signed)
[facility] for Electrolyte Monitoring and Replacement   Recent Labs: Potassium (mmol/L)  Date Value  12/28/2019 5.4 (H)  03/08/2014 3.9   Magnesium (mg/dL)  Date Value  12/28/2019 2.1   Calcium (mg/dL)  Date Value  12/28/2019 8.8 (L)   Calcium, Total (mg/dL)  Date Value  03/08/2014 7.9 (L)   Albumin (g/dL)  Date Value  12/26/2019 2.7 (L)  10/11/2018 4.2  03/07/2014 3.4   Phosphorus (mg/dL)  Date Value  12/28/2019 4.3   Sodium (mmol/L)  Date Value  12/28/2019 134 (L)  10/11/2018 141  03/08/2014 135 (L)   Corrected Ca: 9.1 mg/dL  Assessment: 54 y/o M with medical history including atrial fibrillation on apixaban, CHF, CKD, alcohol abuse who is admitted with severe COVID-19 pneumonia with ARDS.   Admission further complicated by AKI which is improving. Holding home [place]. Home furosemide re-started yesterday. UOP 1600 mL yesterday. Potassium 5.4 today. Co2 18 on oral sodium bicarb supplementation. Hyperkalemia may be resultant of extracellular shift / possibly beta blocker / RTA. Last bowel movement documented in chart was 10/29.   Goal of Therapy:  Potassium 4.0 - 5.1 mmol/L Magnesium 2.0 - 2.4 mg/dL All Other Electrolytes WNL  Plan:   No electrolyte replacement warranted today  Will defer management of hyperkalemia to primary provider  Re-check electrolytes in AM  [name] 12/28/2019 9:48 AM

## 2019-12-28 NOTE — Progress Notes (Signed)
PROGRESS NOTE    Hector [name]  QHU:[id number] DOB: October 13, 1965 DOA: 12/22/2019 PCP: [name], MD   Chief complaint.  Shortness of breath. Brief Narrative:  Hector [name] is a 54 year old male with past medical history of congestive heart failure chronic kidney disease hyperlipidemia paroxysmal atrial fibrillation and alcohol use who presented to the emergency room today with 2 to 3 weeks history of shortness of breath associated with dry cough and generalized weakness.  In the emergency room, his oxygen saturation was 70%, he was placed on 100% nonbreather and admitted to the ICU.  He has been requiring heated high flow since being there.  He was started on IV steroids, completed remdesivir.  He was started on baricitinib, his CRP has normalized, baricitinib was discontinued.  I am assigned to this patient on 12/28/2019.  Patient still high flow oxygen.  We will check a BNP, cover with Zosyn for now and check procalcitonin level tomorrow.   Assessment & Plan:   Principal Problem:   Pneumonia due to COVID-19 virus Active Problems:   Hypertensive heart disease   Hyperlipidemia   Chronic kidney disease (CKD), stage III (moderate) (HCC)   Paroxysmal atrial fibrillation (HCC)   Chronic diastolic heart failure (HCC)   Essential hypertension   Nonischemic cardiomyopathy (HCC)   Chronic systolic heart failure ([place])   Acute respiratory failure with hypoxemia ([place])   Sepsis ([place])   COVID-19  #1.  Acute hypoxemic respite failure secondary to COVID-19 pneumonia. ARDS. Reviewed patient chest x-ray, still diffuse alveolar disease. Patient has completed remdesivir, still on steroids IV. Eliquis for anticoagulation. CRP normalized, did not complete 14 days of baricitinib. Patient still has significant hypoxemia, check a BNP and a procalcitonin level.  Will cover with Zosyn for now. Proning as much as possible. Patient has been transferred to stepdown unit by ICU  team.  #2.  Acute kidney injury on chronic kidney disease stage IIIb.  Metabolic acidosis, hyperkalemia and hyponatremia. Patient is a followed by nephrology.    3.  Chronic combined systolic and diastolic congestive heart failure. Entresto on hold due to acute kidney injury.  Patient most recent echocardiogram performed a month ago showed ejection fraction 45 to 50%.  4.  Essential hypertension. Continue current treatment.  5.  Severe sepsis. Patient met severe sepsis criteria at time of admission with tachycardia, tachypnea, acute kidney injury, lactic acidosis of 3.0, acute respiratory failure. This is secondary to Covid pneumonia.  Condition seem to have improved.  6.  Paroxysmal atrial fibrillation. Continue anticoagulation.    DVT prophylaxis: Eliquis Code Status: Full Family Communication: patient updated. Mother as contact, will update mother if patient cannot understand.  Disposition Plan:  .   Status is: Inpatient  Remains inpatient appropriate because:Inpatient level of care appropriate due to severity of illness   Dispo: The patient is from: Home              Anticipated d/c is to: Home              Anticipated d/c date is: > 3 days              Patient currently is not medically stable to d/c.        I/O last 3 completed shifts: In: 1690 [P.O.:1690] Out: 2200 [Urine:2200] Total I/O In: -  Out: 800 [Urine:800]     Consultants:   Pulmonology.  Procedures: None  Antimicrobials: Zosyn.  Subjective: Patient is still on heated high flow oxygen.  Shortness  of breath with minimal exertion.  Cough, minimally productive. No chest pain or palpitation. No abdominal pain or nausea vomiting. No fever or chills. No dizziness or headaches.  Objective: Vitals:   12/28/19 1200 12/28/19 1234 12/28/19 1300 12/28/19 1311  BP: (!) 127/96  (!) 143/95   Pulse: 80  72   Resp: (!) 21     Temp:   (!) 96.7 F (35.9 C)   TempSrc:   Axillary   SpO2: 91% 91%  94% 96%  Weight:      Height:        Intake/Output Summary (Last 24 hours) at 12/28/2019 1431 Last data filed at 12/28/2019 0910 Gross per 24 hour  Intake 475 ml  Output 2400 ml  Net -1925 ml   Filed Weights   12/22/19 2100 12/23/19 0500 12/24/19 0500  Weight: 123.6 kg 123.6 kg 123.6 kg    Examination:  General exam: Appears calm and comfortable  Respiratory system: Decreased breathing sounds. Respiratory effort normal. Cardiovascular system: S1 & S2 heard, RRR. No JVD, murmurs, rubs, gallops or clicks. No pedal edema. Gastrointestinal system: Abdomen is nondistended, soft and nontender. No organomegaly or masses felt. Normal bowel sounds heard. Central nervous system: Alert and oriented. No focal neurological deficits. Extremities: Symmetric 5 x 5 power. Skin: No rashes, lesions or ulcers Psychiatry:  Mood & affect appropriate.     Data Reviewed: I have personally reviewed following labs and imaging studies  CBC: Recent Labs  Lab 12/22/19 0849 12/22/19 0849 12/23/19 0351 12/23/19 0351 12/24/19 0142 12/25/19 0529 12/26/19 0711 12/27/19 0458 12/28/19 0802  WBC 7.1   < > 4.5   < > 9.4 9.8 11.5* 13.4* 18.5*  NEUTROABS 5.9  --  3.5  --   --   --   --  9.9* 13.9*  HGB 16.4   < > 14.8   < > 13.7 14.0 13.7 13.8 15.2  HCT 49.5   < > 43.9   < > 40.9 40.8 40.5 41.7 46.7  MCV 86.7   < > 86.2   < > 85.4 84.5 86.2 85.5 87.1  PLT 223   < > 210   < > 305 328 335 377 349   < > = values in this interval not displayed.   Basic Metabolic Panel: Recent Labs  Lab 12/24/19 0142 12/24/19 0142 12/24/19 0845 12/25/19 0529 12/26/19 0711 12/27/19 0458 12/28/19 0802  NA 129*   < > 131* 133* 134* 136 134*  K 4.5  --   --  4.8 4.9 5.1 5.4*  CL 97*  --   --  103 107 108 104  CO2 20*  --   --  20* 18* 18* 18*  GLUCOSE 176*  --   --  136* 112* 127* 140*  BUN 72*  --   --  66* 62* 57* 59*  CREATININE 2.80*  --   --  2.49* 2.25* 2.18* 2.22*  CALCIUM 8.0*  --   --  7.8* 8.1* 8.7* 8.8*   MG 2.3  --   --  2.5* 2.6* 2.4 2.1  PHOS 4.2  --   --  3.8 3.6 3.8 4.3   < > = values in this interval not displayed.   GFR: Estimated Creatinine Clearance: 50.3 mL/min (A) (by C-G formula based on SCr of 2.22 mg/dL (H)). Liver Function Tests: Recent Labs  Lab 12/22/19 0849 12/23/19 0351 12/24/19 0142 12/25/19 0529 12/26/19 0711  AST 88* 61*  --   --   --  ALT 39 39  --   --   --   ALKPHOS 43 39  --   --   --   BILITOT 1.3* 1.0  --   --   --   PROT 7.3 6.6  --   --   --   ALBUMIN 3.1* 2.6* 2.6* 2.6* 2.7*   No results for input(s): LIPASE, AMYLASE in the last 168 hours. No results for input(s): AMMONIA in the last 168 hours. Coagulation Profile: Recent Labs  Lab 12/22/19 0849  INR 1.1   Cardiac Enzymes: No results for input(s): CKTOTAL, CKMB, CKMBINDEX, TROPONINI in the last 168 hours. BNP (last 3 results) No results for input(s): PROBNP in the last 8760 hours. HbA1C: No results for input(s): HGBA1C in the last 72 hours. CBG: Recent Labs  Lab 12/27/19 0739 12/27/19 1130 12/27/19 1557 12/27/19 2109 12/28/19 0933  GLUCAP 120* 154* 186* 240* 124*   Lipid Profile: No results for input(s): CHOL, HDL, LDLCALC, TRIG, CHOLHDL, LDLDIRECT in the last 72 hours. Thyroid Function Tests: No results for input(s): TSH, T4TOTAL, FREET4, T3FREE, THYROIDAB in the last 72 hours. Anemia Panel: Recent Labs    12/27/19 0458 12/28/19 0802  FERRITIN 785* 822*   Sepsis Labs: Recent Labs  Lab 12/22/19 0849 12/22/19 1056 12/22/19 2151 12/23/19 0351 12/24/19 0142  PROCALCITON  --   --  1.22 1.16 0.99  LATICACIDVEN 3.0* 2.7*  --   --  1.8    Recent Results (from the past 240 hour(s))  Blood Culture (routine x 2)     Status: None   Collection Time: 12/22/19  8:49 AM   Specimen: BLOOD  Result Value Ref Range Status   Specimen Description BLOOD RT [facility]  Final   Special Requests   Final    BOTTLES DRAWN AEROBIC AND ANAEROBIC Blood Culture adequate volume   Culture   Final     NO GROWTH 5 DAYS Performed at [facility], [address], [place], [place] [address]    Report Status 12/27/2019 FINAL  Final  Blood Culture (routine x 2)     Status: None   Collection Time: 12/22/19  8:49 AM   Specimen: BLOOD  Result Value Ref Range Status   Specimen Description BLOOD RT HAND  Final   Special Requests   Final    BOTTLES DRAWN AEROBIC AND ANAEROBIC Blood Culture adequate volume   Culture   Final    NO GROWTH 5 DAYS Performed at [facility], [address], [place], [place] [address]    Report Status 12/27/2019 FINAL  Final  Urine culture     Status: None   Collection Time: 12/22/19  8:49 AM   Specimen: Urine, Random  Result Value Ref Range Status   Specimen Description   Final    URINE, RANDOM Performed at [facility], [address] [place], [place] [address]    Special Requests   Final    NONE Performed at [facility], [address] [place], [place] [address]    Culture   Final    NO GROWTH Performed at [facility], [place] [address] [place], [place] [address]    Report Status 12/23/2019 FINAL  Final  Respiratory Panel by RT PCR (Flu A&B, Covid) - Nasopharyngeal Swab     Status: Abnormal   Collection Time: 12/22/19  8:49 AM   Specimen: Nasopharyngeal Swab  Result Value Ref Range Status   SARS Coronavirus 2 by RT PCR POSITIVE (A) NEGATIVE  Final    Comment: RESULT CALLED TO, READ BACK BY AND VERIFIED WITH: KENDALL FLANNIGAN AT 1638 ON 12/22/2019 [place]. (NOTE) SARS-CoV-2 target nucleic acids are DETECTED.  SARS-CoV-2 RNA is generally detectable in upper respiratory specimens  during the acute phase of infection. Positive results are indicative of the presence of the identified virus, but do not rule out bacterial infection or co-infection with other pathogens not detected by the test. Clinical correlation with patient history and other diagnostic information is necessary to determine patient infection  status. The expected result is Negative.  Fact Sheet for Patients:  PinkCheek.be  Fact Sheet for Healthcare Providers: GravelBags.it  This test is not yet approved or cleared by the [place] FDA and  has been authorized for detection and/or diagnosis of SARS-CoV-2 by FDA under an Emergency Use Authorization (EUA).  This EUA will remain in effect (meaning this te st can be used) for the duration of  the COVID-19 declaration under Section 564(b)(1) of the Act, 21 U.S.C. section 360bbb-3(b)(1), unless the authorization is terminated or revoked sooner.      Influenza A by PCR NEGATIVE NEGATIVE Final   Influenza B by PCR NEGATIVE NEGATIVE Final    Comment: (NOTE) The Xpert Xpress SARS-CoV-2/FLU/RSV assay is intended as an aid in  the diagnosis of influenza from Nasopharyngeal swab specimens and  should not be used as a sole basis for treatment. Nasal washings and  aspirates are unacceptable for Xpert Xpress SARS-CoV-2/FLU/RSV  testing.  Fact Sheet for Patients: PinkCheek.be  Fact Sheet for Healthcare Providers: GravelBags.it  This test is not yet approved or cleared by the [place] FDA and  has been authorized for detection and/or diagnosis of SARS-CoV-2 by  FDA under an Emergency Use Authorization (EUA). This EUA will remain  in effect (meaning this test can be used) for the duration of the  Covid-19 declaration under Section 564(b)(1) of the Act, 21  U.S.C. section 360bbb-3(b)(1), unless the authorization is  terminated or revoked. Performed at [facility], [address] [place], [place] [address]   MRSA PCR Screening     Status: None   Collection Time: 12/22/19  9:16 PM   Specimen: Nasal Mucosa; Nasopharyngeal  Result Value Ref Range Status   MRSA by PCR NEGATIVE NEGATIVE Final    Comment:        The GeneXpert MRSA Assay (FDA approved  for NASAL specimens only), is one component of a comprehensive MRSA colonization surveillance program. It is not intended to diagnose MRSA infection nor to guide or monitor treatment for MRSA infections. Performed at [facility], [address], [place], [place] [address]          Radiology Studies: [facility] Chest [place] 1 View  Result Date: 12/28/2019 CLINICAL DATA:  Shortness of breath. EXAM: PORTABLE CHEST 1 VIEW COMPARISON:  12/24/2019. FINDINGS: Stable cardiomegaly. Prominent diffuse bilateral pulmonary infiltrates/edema again noted. Similar findings on prior exam. No prominent pleural effusion. No pneumothorax. IMPRESSION: Stable cardiomegaly. Prominent diffuse bilateral pulmonary infiltrates/edema again noted. Similar findings on prior exam. Electronically Signed   By: [place]   On: 12/28/2019 05:36        Scheduled Meds: . allopurinol  100 mg Oral Daily  . amiodarone  200 mg Oral Daily  . amLODipine  5 mg Oral Daily  . apixaban  5 mg Oral BID  . atorvastatin  80 mg Oral QHS  . carvedilol  3.125 mg Oral BID WC  . Chlorhexidine Gluconate Cloth  6 each  Topical Daily  . dexamethasone (DECADRON) injection  6 mg Intravenous Q24H  . furosemide  40 mg Oral Daily  . insulin aspart  0-20 Units Subcutaneous TID AC & HS  . insulin aspart  3 Units Subcutaneous TID with meals  . levothyroxine  100 mcg Oral QAC breakfast  . sodium bicarbonate  1,300 mg Oral BID  . sodium zirconium cyclosilicate  10 g Oral Daily  . traZODone  50 mg Oral QHS   Continuous Infusions: . piperacillin-tazobactam (ZOSYN)  IV       LOS: 6 days    Time spent: 36 minutes    Sharen Hones, MD [facility]   To contact the attending provider between 7A-7P or the covering provider during after hours 7P-7A, please log into the web site [email or web address] and access using universal [place] password for that web site. If you do not have the password, please call the hospital  operator.  12/28/2019, 2:31 PM

## 2019-12-28 NOTE — Progress Notes (Signed)
[facility]  ROUNDING NOTE   Subjective:   UOP 1674mL  K 5.4, CO2 18   Objective:  Vital signs in last 24 hours:  Temp:  [96.8 F (36 C)-97.3 F (36.3 C)] 97 F (36.1 C) (11/04 0400) Pulse Rate:  [53-78] 74 (11/04 0400) Resp:  [15-29] 20 (11/04 0400) BP: (143-176)/(98-119) 168/106 (11/04 0400) SpO2:  [91 %-100 %] 97 % (11/04 0400) FiO2 (%):  [40 %-45 %] 40 % (11/04 0500)  Weight change:  Filed Weights   12/22/19 2100 12/23/19 0500 12/24/19 0500  Weight: 123.6 kg 123.6 kg 123.6 kg    Intake/Output: I/O last 3 completed shifts: In: 1690 [P.O.:1690] Out: 2200 [Urine:2200]   Intake/Output this shift:  No intake/output data recorded.  Physical Exam: General: Critically ill  Head: Normocephalic, atraumatic. Moist oral mucosal membranes  Eyes: Anicteric, PERRL  Neck: Supple, trachea midline  Lungs:  +coarse breath sounds, HFNC 50%  Heart: Regular rate and rhythm  Abdomen:  Soft, nontender  Extremities: No peripheral edema.  Neurologic: Nonfocal, moving all four extremities  Skin: No lesions        Basic Metabolic Panel: Recent Labs  Lab 12/24/19 0142 12/24/19 0142 12/24/19 0845 12/25/19 0529 12/25/19 0529 12/26/19 0711 12/27/19 0458 12/28/19 0802  NA 129*   < > 131* 133*  --  134* 136 134*  K 4.5  --   --  4.8  --  4.9 5.1 5.4*  CL 97*  --   --  103  --  107 108 104  CO2 20*  --   --  20*  --  18* 18* 18*  GLUCOSE 176*  --   --  136*  --  112* 127* 140*  BUN 72*  --   --  66*  --  62* 57* 59*  CREATININE 2.80*  --   --  2.49*  --  2.25* 2.18* 2.22*  CALCIUM 8.0*   < >  --  7.8*   < > 8.1* 8.7* 8.8*  MG 2.3  --   --  2.5*  --  2.6* 2.4 2.1  PHOS 4.2  --   --  3.8  --  3.6 3.8 4.3   < > = values in this interval not displayed.    Liver Function Tests: Recent Labs  Lab 12/22/19 0849 12/23/19 0351 12/24/19 0142 12/25/19 0529 12/26/19 0711  AST 88* 61*  --   --   --   ALT 39 39  --   --   --   ALKPHOS 43 39  --   --   --   BILITOT  1.3* 1.0  --   --   --   PROT 7.3 6.6  --   --   --   ALBUMIN 3.1* 2.6* 2.6* 2.6* 2.7*   No results for input(s): LIPASE, AMYLASE in the last 168 hours. No results for input(s): AMMONIA in the last 168 hours.  CBC: Recent Labs  Lab 12/22/19 0849 12/22/19 0849 12/23/19 0351 12/23/19 0351 12/24/19 0142 12/25/19 0529 12/26/19 0711 12/27/19 0458 12/28/19 0802  WBC 7.1   < > 4.5   < > 9.4 9.8 11.5* 13.4* 18.5*  NEUTROABS 5.9  --  3.5  --   --   --   --  9.9* 13.9*  HGB 16.4   < > 14.8   < > 13.7 14.0 13.7 13.8 15.2  HCT 49.5   < > 43.9   < > 40.9 40.8 40.5 41.7 46.7  MCV 86.7   < > 86.2   < > 85.4 84.5 86.2 85.5 87.1  PLT 223   < > 210   < > 305 328 335 377 349   < > = values in this interval not displayed.    Cardiac Enzymes: No results for input(s): CKTOTAL, CKMB, CKMBINDEX, TROPONINI in the last 168 hours.  BNP: Invalid input(s): POCBNP  CBG: Recent Labs  Lab 12/27/19 0739 12/27/19 1130 12/27/19 1557 12/27/19 2109 12/28/19 0933  GLUCAP 120* 154* 186* 240* 62*    Microbiology: Results for orders placed or performed during the hospital encounter of 12/22/19  Blood Culture (routine x 2)     Status: None   Collection Time: 12/22/19  8:49 AM   Specimen: BLOOD  Result Value Ref Range Status   Specimen Description BLOOD RT [facility]  Final   Special Requests   Final    BOTTLES DRAWN AEROBIC AND ANAEROBIC Blood Culture adequate volume   Culture   Final    NO GROWTH 5 DAYS Performed at [facility], [address], [place], [place] [address]    Report Status 12/27/2019 FINAL  Final  Blood Culture (routine x 2)     Status: None   Collection Time: 12/22/19  8:49 AM   Specimen: BLOOD  Result Value Ref Range Status   Specimen Description BLOOD RT HAND  Final   Special Requests   Final    BOTTLES DRAWN AEROBIC AND ANAEROBIC Blood Culture adequate volume   Culture   Final    NO GROWTH 5 DAYS Performed at [facility], [address], [place],  [place] [address]    Report Status 12/27/2019 FINAL  Final  Urine culture     Status: None   Collection Time: 12/22/19  8:49 AM   Specimen: Urine, Random  Result Value Ref Range Status   Specimen Description   Final    URINE, RANDOM Performed at [facility], [address] [place], [place] [address]    Special Requests   Final    NONE Performed at [facility], [address] [place], [place] [address]    Culture   Final    NO GROWTH Performed at [facility], [place] [address] [place],  [address]    Report Status 12/23/2019 FINAL  Final  Respiratory Panel by RT PCR (Flu A&B, Covid) - Nasopharyngeal Swab     Status: Abnormal   Collection Time: 12/22/19  8:49 AM   Specimen: Nasopharyngeal Swab  Result Value Ref Range Status   SARS Coronavirus 2 by RT PCR POSITIVE (A) NEGATIVE Final    Comment: RESULT CALLED TO, READ BACK BY AND VERIFIED WITH: KENDALL FLANNIGAN AT 2035 ON 12/22/2019 [place]. (NOTE) SARS-CoV-2 target nucleic acids are DETECTED.  SARS-CoV-2 RNA is generally detectable in upper respiratory specimens  during the acute phase of infection. Positive results are indicative of the presence of the identified virus, but do not rule out bacterial infection or co-infection with other pathogens not detected by the test. Clinical correlation with patient history and other diagnostic information is necessary to determine patient infection status. The expected result is Negative.  Fact Sheet for Patients:  PinkCheek.be  Fact Sheet for Healthcare Providers: GravelBags.it  This test is not yet approved or cleared by the [place] FDA and  has been authorized for detection and/or diagnosis of SARS-CoV-2 by FDA under an Emergency Use Authorization (EUA).  This EUA will remain in effect (meaning this te  st can be used) for the duration of  the COVID-19 declaration under Section 564(b)(1) of the  Act, 21 U.S.C. section 360bbb-3(b)(1), unless the authorization is terminated or revoked sooner.      Influenza A by PCR NEGATIVE NEGATIVE Final   Influenza B by PCR NEGATIVE NEGATIVE Final    Comment: (NOTE) The Xpert Xpress SARS-CoV-2/FLU/RSV assay is intended as an aid in  the diagnosis of influenza from Nasopharyngeal swab specimens and  should not be used as a sole basis for treatment. Nasal washings and  aspirates are unacceptable for Xpert Xpress SARS-CoV-2/FLU/RSV  testing.  Fact Sheet for Patients: PinkCheek.be  Fact Sheet for Healthcare Providers: GravelBags.it  This test is not yet approved or cleared by the [place] FDA and  has been authorized for detection and/or diagnosis of SARS-CoV-2 by  FDA under an Emergency Use Authorization (EUA). This EUA will remain  in effect (meaning this test can be used) for the duration of the  Covid-19 declaration under Section 564(b)(1) of the Act, 21  U.S.C. section 360bbb-3(b)(1), unless the authorization is  terminated or revoked. Performed at [facility], [address] [place], [place] [address]   MRSA PCR Screening     Status: None   Collection Time: 12/22/19  9:16 PM   Specimen: Nasal Mucosa; Nasopharyngeal  Result Value Ref Range Status   MRSA by PCR NEGATIVE NEGATIVE Final    Comment:        The GeneXpert MRSA Assay (FDA approved for NASAL specimens only), is one component of a comprehensive MRSA colonization surveillance program. It is not intended to diagnose MRSA infection nor to guide or monitor treatment for MRSA infections. Performed at [facility], [address] [place], [place] [address]     Coagulation Studies: No results for input(s): LABPROT, INR in the last 72 hours.  Urinalysis: No results for input(s): COLORURINE, LABSPEC, PHURINE, GLUCOSEU, HGBUR, BILIRUBINUR, KETONESUR, PROTEINUR, UROBILINOGEN, NITRITE,  LEUKOCYTESUR in the last 72 hours.  Invalid input(s): APPERANCEUR    Imaging: DG Chest Port 1 View  Result Date: 12/28/2019 CLINICAL DATA:  Shortness of breath. EXAM: PORTABLE CHEST 1 VIEW COMPARISON:  12/24/2019. FINDINGS: Stable cardiomegaly. Prominent diffuse bilateral pulmonary infiltrates/edema again noted. Similar findings on prior exam. No prominent pleural effusion. No pneumothorax. IMPRESSION: Stable cardiomegaly. Prominent diffuse bilateral pulmonary infiltrates/edema again noted. Similar findings on prior exam. Electronically Signed   By: Estell Manor   On: 12/28/2019 05:36     Medications:    . allopurinol  100 mg Oral Daily  . amiodarone  200 mg Oral Daily  . amLODipine  5 mg Oral Daily  . apixaban  5 mg Oral BID  . atorvastatin  80 mg Oral QHS  . carvedilol  3.125 mg Oral BID WC  . Chlorhexidine Gluconate Cloth  6 each Topical Daily  . dexamethasone (DECADRON) injection  6 mg Intravenous Q24H  . furosemide  40 mg Oral Daily  . insulin aspart  0-20 Units Subcutaneous TID AC & HS  . insulin aspart  3 Units Subcutaneous TID with meals  . levothyroxine  100 mcg Oral QAC breakfast  . sodium bicarbonate  1,300 mg Oral BID  . traZODone  50 mg Oral QHS   docusate sodium, guaiFENesin-dextromethorphan, hydrALAZINE, HYDROcodone-acetaminophen, polyethylene glycol  Assessment/ Plan:  Hector [name] is a 54 y.o. black male with atrial fibrillation, hyperlipidemia, congestive heart failure, alcohol abuse, hypothyroidism who was admitted to [facility] on 12/22/2019 for Hypoxia [R09.02] Pneumonia due to COVID-19  virus [U07.1, J12.82] COVID-19 [U07.1]   1. Acute kidney injury with hyperkalemia, metabolic acidosis and hyponatremia on chronic kidney disease stage IIIB. Baseline creatinine of 2.37, GFR of 30 on 02/08/2019. Followed by [facility].   Creatinine back to baseline - Continue furosemide.  - holding entresto due to aki and hyperkalemia - Encourage PO intake.  Low potassium diet.  - Start sodium bicarbonate PO bid - Lokelma  2. Hypertension: with urgency on admission. Current regimen of carvedilol.  Elevated at 168/106 - IV hydralazine PRN - Amlodipine started.    LOS: 6 Tylin Stradley 11/4/202110:55 AM

## 2019-12-28 NOTE — Consult Note (Addendum)
Pharmacy Antibiotic Note  Hector [name] is a 54 y.o. male with medical history including CHF, CKD, HLD, EtOH use, paroxysmal Afib admitted on 12/22/2019 with acute respiratory failure secondary to COVID-19 pneumonia.  Pharmacy has been consulted for Zosyn dosing for suspicion for superimposed bacterial pneumonia.  Today, 12/28/19  --Hospital day # 6 --Persistent high supplemental oxygen requirements --Inflammatory markers down-trending on Decadron --Leukocytosis worsening with associated left shift, vitals with hypothermia --Provider re-checking procalcitonin level  Plan: Zosyn 3.375g IV q8h (4 hour infusion).  Height: 5\' 11"  (180.3 cm) Weight: 123.6 kg (272 lb 7.8 oz) IBW/kg (Calculated) : 75.3  Temp (24hrs), Avg:97 F (36.1 C), Min:96.7 F (35.9 C), Max:97.3 F (36.3 C)  Recent Labs  Lab 12/22/19 0849 12/22/19 1056 12/23/19 0351 12/24/19 0142 12/25/19 0529 12/26/19 0711 12/27/19 0458 12/28/19 0802  WBC 7.1  --    < > 9.4 9.8 11.5* 13.4* 18.5*  CREATININE 3.40*  --    < > 2.80* 2.49* 2.25* 2.18* 2.22*  LATICACIDVEN 3.0* 2.7*  --  1.8  --   --   --   --    < > = values in this interval not displayed.    Estimated Creatinine Clearance: 50.3 mL/min (A) (by C-G formula based on SCr of 2.22 mg/dL (H)).    Antimicrobials this admission: Cefepime 10/29 x 1 Vancomycin 10/29 x 1 Metronidazole 10/29 x 1 Ceftriaxone 10/29 >> 10/31 Remdesivir 10/29 >> 11/2 Zosyn 11/4 >>   Dose adjustments this admission: N/A  Microbiology results: 10/29 BCx: negative 10/29 UCx: no growth  10/29 MRSA PCR: negative 10/29 SARS-CoV-2 PCR: positive  Thank you for allowing pharmacy to be a part of this patients care.  [name] 12/28/2019 2:32 PM

## 2019-12-29 DIAGNOSIS — U071 COVID-19: Secondary | ICD-10-CM | POA: Diagnosis not present

## 2019-12-29 DIAGNOSIS — J9601 Acute respiratory failure with hypoxia: Secondary | ICD-10-CM | POA: Diagnosis not present

## 2019-12-29 DIAGNOSIS — I5032 Chronic diastolic (congestive) heart failure: Secondary | ICD-10-CM | POA: Diagnosis not present

## 2019-12-29 LAB — CBC WITH DIFFERENTIAL/PLATELET
Abs Immature Granulocytes: 1.87 10*3/uL — ABNORMAL HIGH (ref 0.00–0.07)
Basophils Absolute: 0.2 10*3/uL — ABNORMAL HIGH (ref 0.0–0.1)
Basophils Relative: 1 %
Eosinophils Absolute: 0 10*3/uL (ref 0.0–0.5)
Eosinophils Relative: 0 %
HCT: 47.4 % (ref 39.0–52.0)
Hemoglobin: 15.5 g/dL (ref 13.0–17.0)
Immature Granulocytes: 10 %
Lymphocytes Relative: 7 %
Lymphs Abs: 1.3 10*3/uL (ref 0.7–4.0)
MCH: 28.5 pg (ref 26.0–34.0)
MCHC: 32.7 g/dL (ref 30.0–36.0)
MCV: 87.1 fL (ref 80.0–100.0)
Monocytes Absolute: 1.2 10*3/uL — ABNORMAL HIGH (ref 0.1–1.0)
Monocytes Relative: 7 %
Neutro Abs: 14.2 10*3/uL — ABNORMAL HIGH (ref 1.7–7.7)
Neutrophils Relative %: 75 %
Platelets: 405 10*3/uL — ABNORMAL HIGH (ref 150–400)
RBC: 5.44 MIL/uL (ref 4.22–5.81)
RDW: 14.4 % (ref 11.5–15.5)
Smear Review: NORMAL
WBC: 18.8 10*3/uL — ABNORMAL HIGH (ref 4.0–10.5)
nRBC: 0 % (ref 0.0–0.2)

## 2019-12-29 LAB — BASIC METABOLIC PANEL
Anion gap: 12 (ref 5–15)
BUN: 61 mg/dL — ABNORMAL HIGH (ref 6–20)
CO2: 18 mmol/L — ABNORMAL LOW (ref 22–32)
Calcium: 8.9 mg/dL (ref 8.9–10.3)
Chloride: 102 mmol/L (ref 98–111)
Creatinine, Ser: 2.44 mg/dL — ABNORMAL HIGH (ref 0.61–1.24)
GFR, Estimated: 30 mL/min — ABNORMAL LOW (ref >60)
Glucose, Bld: 129 mg/dL — ABNORMAL HIGH (ref 70–99)
Potassium: 5.6 mmol/L — ABNORMAL HIGH (ref 3.5–5.1)
Sodium: 132 mmol/L — ABNORMAL LOW (ref 135–145)

## 2019-12-29 LAB — GLUCOSE, CAPILLARY
Glucose-Capillary: 125 mg/dL — ABNORMAL HIGH (ref 70–99)
Glucose-Capillary: 164 mg/dL — ABNORMAL HIGH (ref 70–99)
Glucose-Capillary: 218 mg/dL — ABNORMAL HIGH (ref 70–99)
Glucose-Capillary: 211 mg/dL — ABNORMAL HIGH (ref 70–99)

## 2019-12-29 LAB — TROPONIN I (HIGH SENSITIVITY): Troponin I (High Sensitivity): 63 ng/L — ABNORMAL HIGH (ref <18)

## 2019-12-29 LAB — PROCALCITONIN: Procalcitonin: <0.1 ng/mL

## 2019-12-29 LAB — FERRITIN: Ferritin: 837 ng/mL — ABNORMAL HIGH (ref 24–336)

## 2019-12-29 LAB — C-REACTIVE PROTEIN: CRP: 0.7 mg/dL (ref <1.0)

## 2019-12-29 LAB — BRAIN NATRIURETIC PEPTIDE: B Natriuretic Peptide: 59.6 pg/mL (ref 0.0–100.0)

## 2019-12-29 LAB — MAGNESIUM: Magnesium: 2 mg/dL (ref 1.7–2.4)

## 2019-12-29 LAB — FIBRIN DERIVATIVES D-DIMER (ARMC ONLY): Fibrin derivatives D-dimer (ARMC): 806.7 ng/mL (FEU) — ABNORMAL HIGH (ref 0.00–499.00)

## 2019-12-29 MED ORDER — METOPROLOL TARTRATE 5 MG/5ML IV SOLN
5.0000 mg | Freq: Once | INTRAVENOUS | Status: AC
  Administered 2019-12-29: 5 mg via INTRAVENOUS
  Filled 2019-12-29: qty 5

## 2019-12-29 MED ORDER — BARICITINIB 2 MG PO TABS
2.0000 mg | ORAL_TABLET | Freq: Every day | ORAL | Status: DC
  Administered 2019-12-29: 2 mg via ORAL
  Filled 2019-12-29 (×2): qty 1

## 2019-12-29 NOTE — Progress Notes (Signed)
PT Cancellation Note  Patient Details Name: Hector [name] MRN: [id number] DOB: [birth year]   Cancelled Treatment:    Reason Eval/Treat Not Completed: Medical issues which prohibited therapy (Chart reviewed for attempted treatment session.  Patient noted with critically elevated potassium levels (5.6); contraindicated for exertional activity.  Will hold at this time and re-attempt at later time/date as medically appropriate and available.)   Of note, patient appears to be persistently refusing participation with care/mobility progression with care team; if persists into next treatment session, will modify discharge recommendations to reflect STR vs. CIR given inconsistent participation.   Piedad Standiford H. Owens Shark, PT, DPT, NCS 12/29/19, 2:40 PM [phone]

## 2019-12-29 NOTE — Progress Notes (Addendum)
PHARMACY CONSULT NOTE  Pharmacy Consult for Electrolyte Monitoring and Replacement   Recent Labs: Potassium (mmol/L)  Date Value  12/29/2019 5.6 (H)  03/08/2014 3.9   Magnesium (mg/dL)  Date Value  12/29/2019 2.0   Calcium (mg/dL)  Date Value  12/29/2019 8.9   Calcium, Total (mg/dL)  Date Value  03/08/2014 7.9 (L)   Albumin (g/dL)  Date Value  12/26/2019 2.7 (L)  10/11/2018 4.2  03/07/2014 3.4   Phosphorus (mg/dL)  Date Value  12/28/2019 4.3   Sodium (mmol/L)  Date Value  12/29/2019 132 (L)  10/11/2018 141  03/08/2014 135 (L)   Corrected Ca: 9.1 mg/dL  Assessment: 54 y/o M with medical history including atrial fibrillation on apixaban, CHF, CKD, alcohol abuse who is admitted with severe COVID-19 pneumonia with ARDS.   Admission further complicated by AKI which is improving ; Scr slightly worse today. Holding home [place]. Home furosemide re-started 11/3. UOP 1250 mL yesterday. Potassium 5.6 (hemolyzed) today. Co2 18 (stable) on oral sodium bicarb supplementation. Hyperkalemia may be resultant of extracellular shift / possibly beta blocker / RTA. Patient started on Lokelma 10 g daily 11/4. Last bowel movement documented in chart was 10/29.   Goal of Therapy:  Potassium 4.0 - 5.1 mmol/L Magnesium 2.0 - 2.4 mg/dL All Other Electrolytes WNL  Plan:   No electrolyte replacement warranted today  Will defer management of hyperkalemia to primary provider / nephrology. Will repeat potassium level given hemolysis  Na down to 132 today despite oral furosemide. BNP pending. Defer management to primary provider / nephrology  Re-check electrolytes in AM  Hector [name] 12/29/2019 8:06 AM

## 2019-12-29 NOTE — Progress Notes (Signed)
[facility]  ROUNDING NOTE   Subjective:   UOP 1251mL  K 5.6 (5.4) CO2 18  Not motivated at this time to do anything. Refusing getting out of bed.    Objective:  Vital signs in last 24 hours:  Temp:  [96.7 F (35.9 C)-97.5 F (36.4 C)] 97.5 F (36.4 C) (11/05 0200) Pulse Rate:  [49-85] 64 (11/05 0645) Resp:  [13-25] 17 (11/05 0645) BP: (142-165)/(95-122) 144/112 (11/05 0500) SpO2:  [91 %-100 %] 98 % (11/05 0853) FiO2 (%):  [40 %-60 %] 45 % (11/05 0853)  Weight change:  Filed Weights   12/22/19 2100 12/23/19 0500 12/24/19 0500  Weight: 123.6 kg 123.6 kg 123.6 kg    Intake/Output: I/O last 3 completed shifts: In: 99.7 [IV Piggyback:99.7] Out: 2350 [Urine:2350]   Intake/Output this shift:  No intake/output data recorded.  Physical Exam: General: ill  Head: Normocephalic, atraumatic. Moist oral mucosal membranes  Eyes: Anicteric, PERRL  Neck: Supple, trachea midline  Lungs:  +coarse breath sounds, HFNC 45%  Heart: Regular rate and rhythm  Abdomen:  Soft, nontender  Extremities: No peripheral edema.  Neurologic: Nonfocal, moving all four extremities  Skin: No lesions        Basic Metabolic Panel: Recent Labs  Lab 12/24/19 0142 12/24/19 0845 12/25/19 0529 12/25/19 0529 12/26/19 0711 12/26/19 0711 12/27/19 0458 12/28/19 0802 12/29/19 0633  NA 129*   < > 133*  --  134*  --  136 134* 132*  K 4.5  --  4.8  --  4.9  --  5.1 5.4* 5.6*  CL 97*  --  103  --  107  --  108 104 102  CO2 20*  --  20*  --  18*  --  18* 18* 18*  GLUCOSE 176*  --  136*  --  112*  --  127* 140* 129*  BUN 72*  --  66*  --  62*  --  57* 59* 61*  CREATININE 2.80*  --  2.49*  --  2.25*  --  2.18* 2.22* 2.44*  CALCIUM 8.0*  --  7.8*   < > 8.1*   < > 8.7* 8.8* 8.9  MG 2.3  --  2.5*  --  2.6*  --  2.4 2.1 2.0  PHOS 4.2  --  3.8  --  3.6  --  3.8 4.3  --    < > = values in this interval not displayed.    Liver Function Tests: Recent Labs  Lab 12/23/19 0351  12/24/19 0142 12/25/19 0529 12/26/19 0711  AST 61*  --   --   --   ALT 39  --   --   --   ALKPHOS 39  --   --   --   BILITOT 1.0  --   --   --   PROT 6.6  --   --   --   ALBUMIN 2.6* 2.6* 2.6* 2.7*   No results for input(s): LIPASE, AMYLASE in the last 168 hours. No results for input(s): AMMONIA in the last 168 hours.  CBC: Recent Labs  Lab 12/23/19 0351 12/24/19 0142 12/25/19 0529 12/26/19 0711 12/27/19 0458 12/28/19 0802 12/29/19 0633  WBC 4.5   < > 9.8 11.5* 13.4* 18.5* 18.8*  NEUTROABS 3.5  --   --   --  9.9* 13.9* 14.2*  HGB 14.8   < > 14.0 13.7 13.8 15.2 15.5  HCT 43.9   < > 40.8 40.5 41.7 46.7 47.4  MCV 86.2   < > 84.5 86.2 85.5 87.1 87.1  PLT 210   < > 328 335 377 349 405*   < > = values in this interval not displayed.    Cardiac Enzymes: No results for input(s): CKTOTAL, CKMB, CKMBINDEX, TROPONINI in the last 168 hours.  BNP: Invalid input(s): POCBNP  CBG: Recent Labs  Lab 12/28/19 0933 12/28/19 1153 12/28/19 1730 12/28/19 2115 12/29/19 0901  GLUCAP 124* 146* 242* 168* 125*    Microbiology: Results for orders placed or performed during the hospital encounter of 12/22/19  Blood Culture (routine x 2)     Status: None   Collection Time: 12/22/19  8:49 AM   Specimen: BLOOD  Result Value Ref Range Status   Specimen Description BLOOD RT [facility]  Final   Special Requests   Final    BOTTLES DRAWN AEROBIC AND ANAEROBIC Blood Culture adequate volume   Culture   Final    NO GROWTH 5 DAYS Performed at [facility], [address], [place], [place] [address]    Report Status 12/27/2019 FINAL  Final  Blood Culture (routine x 2)     Status: None   Collection Time: 12/22/19  8:49 AM   Specimen: BLOOD  Result Value Ref Range Status   Specimen Description BLOOD RT HAND  Final   Special Requests   Final    BOTTLES DRAWN AEROBIC AND ANAEROBIC Blood Culture adequate volume   Culture   Final    NO GROWTH 5 DAYS Performed at [facility], [address] [place], [place] [address]    Report Status 12/27/2019 FINAL  Final  Urine culture     Status: None   Collection Time: 12/22/19  8:49 AM   Specimen: Urine, Random  Result Value Ref Range Status   Specimen Description   Final    URINE, RANDOM Performed at [facility], [address], [place], [place] [address]    Special Requests   Final    NONE Performed at [facility], [address], [place], [place] [address]    Culture   Final    NO GROWTH Performed at [facility], [place] [address], [place], [place] [address]    Report Status 12/23/2019 FINAL  Final  Respiratory Panel by RT PCR (Flu A&B, Covid) - Nasopharyngeal Swab     Status: Abnormal   Collection Time: 12/22/19  8:49 AM   Specimen: Nasopharyngeal Swab  Result Value Ref Range Status   SARS Coronavirus 2 by RT PCR POSITIVE (A) NEGATIVE Final    Comment: RESULT CALLED TO, READ BACK BY AND VERIFIED WITH: KENDALL FLANNIGAN AT 8185 ON 12/22/2019 [place]. (NOTE) SARS-CoV-2 target nucleic acids are DETECTED.  SARS-CoV-2 RNA is generally detectable in upper respiratory specimens  during the acute phase of infection. Positive results are indicative of the presence of the identified virus, but do not rule out bacterial infection or co-infection with other pathogens not detected by the test. Clinical correlation with patient history and other diagnostic information is necessary to determine patient infection status. The expected result is Negative.  Fact Sheet for Patients:  PinkCheek.be  Fact Sheet for Healthcare Providers: GravelBags.it  This test is not yet approved or cleared by the [place] FDA and  has been authorized for detection and/or diagnosis of SARS-CoV-2 by FDA under an Emergency Use Authorization (EUA).  This EUA will remain in effect (meaning this te st can be used) for the duration of  the  COVID-19 declaration  under Section 564(b)(1) of the Act, 21 U.S.C. section 360bbb-3(b)(1), unless the authorization is terminated or revoked sooner.      Influenza A by PCR NEGATIVE NEGATIVE Final   Influenza B by PCR NEGATIVE NEGATIVE Final    Comment: (NOTE) The Xpert Xpress SARS-CoV-2/FLU/RSV assay is intended as an aid in  the diagnosis of influenza from Nasopharyngeal swab specimens and  should not be used as a sole basis for treatment. Nasal washings and  aspirates are unacceptable for Xpert Xpress SARS-CoV-2/FLU/RSV  testing.  Fact Sheet for Patients: PinkCheek.be  Fact Sheet for Healthcare Providers: GravelBags.it  This test is not yet approved or cleared by the [place] FDA and  has been authorized for detection and/or diagnosis of SARS-CoV-2 by  FDA under an Emergency Use Authorization (EUA). This EUA will remain  in effect (meaning this test can be used) for the duration of the  Covid-19 declaration under Section 564(b)(1) of the Act, 21  U.S.C. section 360bbb-3(b)(1), unless the authorization is  terminated or revoked. Performed at [facility], [address] [place], [place] [address]   MRSA PCR Screening     Status: None   Collection Time: 12/22/19  9:16 PM   Specimen: Nasal Mucosa; Nasopharyngeal  Result Value Ref Range Status   MRSA by PCR NEGATIVE NEGATIVE Final    Comment:        The GeneXpert MRSA Assay (FDA approved for NASAL specimens only), is one component of a comprehensive MRSA colonization surveillance program. It is not intended to diagnose MRSA infection nor to guide or monitor treatment for MRSA infections. Performed at [facility], [address] [place], [place] [address]     Coagulation Studies: No results for input(s): LABPROT, INR in the last 72 hours.  Urinalysis: No results for input(s): COLORURINE, LABSPEC, PHURINE, GLUCOSEU, HGBUR, BILIRUBINUR, KETONESUR,  PROTEINUR, UROBILINOGEN, NITRITE, LEUKOCYTESUR in the last 72 hours.  Invalid input(s): APPERANCEUR    Imaging: DG Chest Port 1 View  Result Date: 12/28/2019 CLINICAL DATA:  Shortness of breath. EXAM: PORTABLE CHEST 1 VIEW COMPARISON:  12/24/2019. FINDINGS: Stable cardiomegaly. Prominent diffuse bilateral pulmonary infiltrates/edema again noted. Similar findings on prior exam. No prominent pleural effusion. No pneumothorax. IMPRESSION: Stable cardiomegaly. Prominent diffuse bilateral pulmonary infiltrates/edema again noted. Similar findings on prior exam. Electronically Signed   By: [place]   On: 12/28/2019 05:36     Medications:    . allopurinol  100 mg Oral Daily  . amiodarone  200 mg Oral Daily  . amLODipine  5 mg Oral Daily  . apixaban  5 mg Oral BID  . atorvastatin  80 mg Oral QHS  . baricitinib  2 mg Oral Daily  . carvedilol  3.125 mg Oral BID WC  . Chlorhexidine Gluconate Cloth  6 each Topical Daily  . dexamethasone (DECADRON) injection  6 mg Intravenous Q24H  . furosemide  40 mg Oral Daily  . insulin aspart  0-20 Units Subcutaneous TID AC & HS  . insulin aspart  3 Units Subcutaneous TID with meals  . levothyroxine  100 mcg Oral QAC breakfast  . sodium bicarbonate  1,300 mg Oral BID  . sodium zirconium cyclosilicate  10 g Oral Daily  . traZODone  50 mg Oral QHS   docusate sodium, guaiFENesin-dextromethorphan, hydrALAZINE, HYDROcodone-acetaminophen, polyethylene glycol  Assessment/ Plan:  [name] is a 54 y.o. black male with atrial fibrillation, hyperlipidemia, systolic congestive heart failure, alcohol abuse, hypothyroidism who was admitted to [facility]  on 12/22/2019 for Hypoxia [R09.02] Pneumonia due to COVID-19 virus [U07.1, J12.82] COVID-19 [U07.1]   1. Acute kidney injury with hyperkalemia, metabolic acidosis and hyponatremia on chronic kidney disease stage IIIB. Baseline creatinine of 2.37, GFR of 30 on 02/08/2019. Followed by [facility].    Creatinine back to baseline - Continue furosemide.  - holding entresto due to aki and hyperkalemia - Encourage PO intake. Low potassium diet.  - Continue sodium bicarbonate PO bid - Continue Lokelma - Low potassium diet  2. Hypertension: with urgency on admission. Current regimen of carvedilol.  Elevated at 140/103 - steroid driven - IV hydralazine PRN - Amlodipine started this admission.    LOS: 7 Gwenivere Hiraldo 11/5/202112:10 PM

## 2019-12-29 NOTE — Progress Notes (Signed)
OT Cancellation Note  Patient Details Name: [name] MRN: [id number] DOB: 1965-06-21   Cancelled Treatment:    Reason Eval/Treat Not Completed: Medical issues which prohibited therapy  Upon chart review, noted pt's K+ is elevated to 5.6. Will hold OT at this time and f/u at later date/time as pt becomes more appropriate. Thank you.  Gerrianne Scale, [place], OTR/L ascom [phone] 12/29/19, 2:44 PM

## 2019-12-29 NOTE — Progress Notes (Signed)
PROGRESS NOTE    Hector [name]  RWE:[id number] DOB: 07/07/1965 DOA: 12/22/2019 PCP: [name], MD   Chief complaint shortness of breath. Brief Narrative:  Hector [name] is a 54 year old male with past medical history of congestive heart failure chronic kidney disease hyperlipidemia paroxysmal atrial fibrillation and alcohol use who presented to the emergency room today with 2 to 3 weeks history of shortness of breath associated with dry cough and generalized weakness.  In the emergency room, his oxygen saturation was 70%, he was placed on 100% nonbreather and admitted to the ICU.  He has been requiring heated high flow since being there.  He was started on IV steroids, completed remdesivir.  He was started on baricitinib, his CRP has normalized, baricitinib was discontinued.  I am assigned to this patient on 12/28/2019.  Patient still high flow oxygen.  Normal BMP procalcitonin level less than 0.01.   Assessment & Plan:   Principal Problem:   Pneumonia due to COVID-19 virus Active Problems:   Hypertensive heart disease   Hyperlipidemia   Chronic kidney disease (CKD), stage III (moderate) (HCC)   Paroxysmal atrial fibrillation (HCC)   Chronic diastolic heart failure (HCC)   Essential hypertension   Nonischemic cardiomyopathy (HCC)   Chronic systolic heart failure ([place])   Acute respiratory failure with hypoxemia ([place])   Sepsis ([place])   COVID-19  #1.  Acute hypoxemic respiratory failure, ARDS, COVID-19 pneumonia. I personally reviewed patient chest x-ray image performed yesterday, diffuse bilateral airspace disease.  Consistent with ARDS. Patient has a normal BNP, no volume overload.  With worsening renal function, I will not start on diuretics. Procalcitonin level less than 0.1, no bacterial infection.  Discontinue Zosyn. Patient condition is not improving, discussed with pharmacy, will restart baricitinib.  Continue IV steroids. Patient is also on  therapeutic Eliquis for anticoagulation. Discussed with patient about the proning, patient adamantly refused it. Patient condition is serious, very high probability of deterioration and requiring mechanical ventilation.  We will continue monitor closely in the stepdown unit.  #2.  Acute kidney injury on chronic kidney disease stage IIIb.  Medical acidosis, hyperkalemia and hyponatremia. Patient still has significant hyperkalemia, followed by nephrology.  3.  Chronic combined systolic and diastolic congestive heart failure. Currently, patient does not have any volume overload.  4.  Severe sepsis. Condition had improved.  5.  Paroxysmal atrial fibrillation. Continue anticoagulation.  6.  Morbid obesity      DVT prophylaxis: Eliquis Code Status: Full Family Communication: Patient does not give permission to talk to his family, states that if they want to know his condition they should call him.  .   Status is: Inpatient  Remains inpatient appropriate because:Inpatient level of care appropriate due to severity of illness   Dispo: The patient is from: Home              Anticipated d/c is to: Home              Anticipated d/c date is: > 3 days              Patient currently is not medically stable to d/c.        I/O last 3 completed shifts: In: 99.7 [IV Piggyback:99.7] Out: 2350 [Urine:2350] No intake/output data recorded.     Consultants:   None  Procedures: None Antimicrobials: None  Subjective: Patient still heated high flow oxygen.  Short of breath with any movement.  Cough, nonproductive.  Denies any chest pain or palpitation.  No fever or chills. No abdominal pain nausea vomiting. No dizziness or headaches. Denies any dysuria or hematuria.  Objective: Vitals:   12/29/19 0620 12/29/19 0630 12/29/19 0645 12/29/19 0853  BP:      Pulse: 61 64 64   Resp: 19 19 17    Temp:      TempSrc:      SpO2: 97% 95% 97% 98%  Weight:      Height:         Intake/Output Summary (Last 24 hours) at 12/29/2019 0956 Last data filed at 12/29/2019 0200 Gross per 24 hour  Intake 99.68 ml  Output 450 ml  Net -350.32 ml   Filed Weights   12/22/19 2100 12/23/19 0500 12/24/19 0500  Weight: 123.6 kg 123.6 kg 123.6 kg    Examination:  General exam: Appears calm and comfortable  Respiratory system: Decreased breathing sounds with some crackles in the base.Marland Kitchen Respiratory effort normal. Cardiovascular system: S1 & S2 heard, RRR. No JVD, murmurs, rubs, gallops or clicks. No pedal edema. Gastrointestinal system: Abdomen is nondistended, soft and nontender. No organomegaly or masses felt. Normal bowel sounds heard. Central nervous system: Alert and oriented x3. No focal neurological deficits. Extremities: Symmetric  Skin: No rashes, lesions or ulcers Psychiatry: Judgement and insight appear normal. Mood & affect appropriate.     Data Reviewed: I have personally reviewed following labs and imaging studies  CBC: Recent Labs  Lab 12/23/19 0351 12/24/19 0142 12/25/19 0529 12/26/19 0711 12/27/19 0458 12/28/19 0802 12/29/19 0633  WBC 4.5   < > 9.8 11.5* 13.4* 18.5* 18.8*  NEUTROABS 3.5  --   --   --  9.9* 13.9* 14.2*  HGB 14.8   < > 14.0 13.7 13.8 15.2 15.5  HCT 43.9   < > 40.8 40.5 41.7 46.7 47.4  MCV 86.2   < > 84.5 86.2 85.5 87.1 87.1  PLT 210   < > 328 335 377 349 405*   < > = values in this interval not displayed.   Basic Metabolic Panel: Recent Labs  Lab 12/24/19 0142 12/24/19 0845 12/25/19 0529 12/26/19 0711 12/27/19 0458 12/28/19 0802 12/29/19 0633  NA 129*   < > 133* 134* 136 134* 132*  K 4.5  --  4.8 4.9 5.1 5.4* 5.6*  CL 97*  --  103 107 108 104 102  CO2 20*  --  20* 18* 18* 18* 18*  GLUCOSE 176*  --  136* 112* 127* 140* 129*  BUN 72*  --  66* 62* 57* 59* 61*  CREATININE 2.80*  --  2.49* 2.25* 2.18* 2.22* 2.44*  CALCIUM 8.0*  --  7.8* 8.1* 8.7* 8.8* 8.9  MG 2.3  --  2.5* 2.6* 2.4 2.1 2.0  PHOS 4.2  --  3.8 3.6 3.8  4.3  --    < > = values in this interval not displayed.   GFR: Estimated Creatinine Clearance: 45.8 mL/min (A) (by C-G formula based on SCr of 2.44 mg/dL (H)). Liver Function Tests: Recent Labs  Lab 12/23/19 0351 12/24/19 0142 12/25/19 0529 12/26/19 0711  AST 61*  --   --   --   ALT 39  --   --   --   ALKPHOS 39  --   --   --   BILITOT 1.0  --   --   --   PROT 6.6  --   --   --   ALBUMIN 2.6* 2.6* 2.6* 2.7*   No results for input(s): LIPASE, AMYLASE  in the last 168 hours. No results for input(s): AMMONIA in the last 168 hours. Coagulation Profile: No results for input(s): INR, PROTIME in the last 168 hours. Cardiac Enzymes: No results for input(s): CKTOTAL, CKMB, CKMBINDEX, TROPONINI in the last 168 hours. BNP (last 3 results) No results for input(s): PROBNP in the last 8760 hours. HbA1C: No results for input(s): HGBA1C in the last 72 hours. CBG: Recent Labs  Lab 12/28/19 0933 12/28/19 1153 12/28/19 1730 12/28/19 2115 12/29/19 0901  GLUCAP 124* 146* 242* 168* 125*   Lipid Profile: No results for input(s): CHOL, HDL, LDLCALC, TRIG, CHOLHDL, LDLDIRECT in the last 72 hours. Thyroid Function Tests: No results for input(s): TSH, T4TOTAL, FREET4, T3FREE, THYROIDAB in the last 72 hours. Anemia Panel: Recent Labs    12/28/19 0802 12/29/19 0633  FERRITIN 822* 837*   Sepsis Labs: Recent Labs  Lab 12/22/19 1056 12/22/19 2151 12/23/19 0351 12/24/19 0142 12/29/19 0633  PROCALCITON  --  1.22 1.16 0.99 <0.10  LATICACIDVEN 2.7*  --   --  1.8  --     Recent Results (from the past 240 hour(s))  Blood Culture (routine x 2)     Status: None   Collection Time: 12/22/19  8:49 AM   Specimen: BLOOD  Result Value Ref Range Status   Specimen Description BLOOD RT [facility]  Final   Special Requests   Final    BOTTLES DRAWN AEROBIC AND ANAEROBIC Blood Culture adequate volume   Culture   Final    NO GROWTH 5 DAYS Performed at [facility], [address],  [place], [place] [address]    Report Status 12/27/2019 FINAL  Final  Blood Culture (routine x 2)     Status: None   Collection Time: 12/22/19  8:49 AM   Specimen: BLOOD  Result Value Ref Range Status   Specimen Description BLOOD RT HAND  Final   Special Requests   Final    BOTTLES DRAWN AEROBIC AND ANAEROBIC Blood Culture adequate volume   Culture   Final    NO GROWTH 5 DAYS Performed at [facility], [address], [place], [place] [address]    Report Status 12/27/2019 FINAL  Final  Urine culture     Status: None   Collection Time: 12/22/19  8:49 AM   Specimen: Urine, Random  Result Value Ref Range Status   Specimen Description   Final    URINE, RANDOM Performed at [facility], [address] [place], [place] [address]    Special Requests   Final    NONE Performed at [facility], [address] [place], [place] [address]    Culture   Final    NO GROWTH Performed at [facility], [place] [address], [place], [place] [address]    Report Status 12/23/2019 FINAL  Final  Respiratory Panel by RT PCR (Flu A&B, Covid) - Nasopharyngeal Swab     Status: Abnormal   Collection Time: 12/22/19  8:49 AM   Specimen: Nasopharyngeal Swab  Result Value Ref Range Status   SARS Coronavirus 2 by RT PCR POSITIVE (A) NEGATIVE Final    Comment: RESULT CALLED TO, READ BACK BY AND VERIFIED WITH: KENDALL FLANNIGAN AT 3790 ON 12/22/2019 [place]. (NOTE) SARS-CoV-2 target nucleic acids are DETECTED.  SARS-CoV-2 RNA is generally detectable in upper respiratory specimens  during the acute phase of infection. Positive results are indicative of the presence of the identified virus, but do not rule out bacterial infection or co-infection with  other pathogens not detected by the test. Clinical correlation with patient history and other diagnostic information is necessary to determine patient infection status. The expected result is Negative.  Fact Sheet for Patients:   PinkCheek.be  Fact Sheet for Healthcare Providers: GravelBags.it  This test is not yet approved or cleared by the [place] FDA and  has been authorized for detection and/or diagnosis of SARS-CoV-2 by FDA under an Emergency Use Authorization (EUA).  This EUA will remain in effect (meaning this te st can be used) for the duration of  the COVID-19 declaration under Section 564(b)(1) of the Act, 21 U.S.C. section 360bbb-3(b)(1), unless the authorization is terminated or revoked sooner.      Influenza A by PCR NEGATIVE NEGATIVE Final   Influenza B by PCR NEGATIVE NEGATIVE Final    Comment: (NOTE) The Xpert Xpress SARS-CoV-2/FLU/RSV assay is intended as an aid in  the diagnosis of influenza from Nasopharyngeal swab specimens and  should not be used as a sole basis for treatment. Nasal washings and  aspirates are unacceptable for Xpert Xpress SARS-CoV-2/FLU/RSV  testing.  Fact Sheet for Patients: PinkCheek.be  Fact Sheet for Healthcare Providers: GravelBags.it  This test is not yet approved or cleared by the [place] FDA and  has been authorized for detection and/or diagnosis of SARS-CoV-2 by  FDA under an Emergency Use Authorization (EUA). This EUA will remain  in effect (meaning this test can be used) for the duration of the  Covid-19 declaration under Section 564(b)(1) of the Act, 21  U.S.C. section 360bbb-3(b)(1), unless the authorization is  terminated or revoked. Performed at [facility], [address] [place], [place] [address]   MRSA PCR Screening     Status: None   Collection Time: 12/22/19  9:16 PM   Specimen: Nasal Mucosa; Nasopharyngeal  Result Value Ref Range Status   MRSA by PCR NEGATIVE NEGATIVE Final    Comment:        The GeneXpert MRSA Assay (FDA approved for NASAL specimens only), is one component of a comprehensive  MRSA colonization surveillance program. It is not intended to diagnose MRSA infection nor to guide or monitor treatment for MRSA infections. Performed at [facility], [address] [place], [place] [address]          Radiology Studies: [facility] Chest [place] 1 View  Result Date: 12/28/2019 CLINICAL DATA:  Shortness of breath. EXAM: PORTABLE CHEST 1 VIEW COMPARISON:  12/24/2019. FINDINGS: Stable cardiomegaly. Prominent diffuse bilateral pulmonary infiltrates/edema again noted. Similar findings on prior exam. No prominent pleural effusion. No pneumothorax. IMPRESSION: Stable cardiomegaly. Prominent diffuse bilateral pulmonary infiltrates/edema again noted. Similar findings on prior exam. Electronically Signed   By: [place]   On: 12/28/2019 05:36        Scheduled Meds: . allopurinol  100 mg Oral Daily  . amiodarone  200 mg Oral Daily  . amLODipine  5 mg Oral Daily  . apixaban  5 mg Oral BID  . atorvastatin  80 mg Oral QHS  . carvedilol  3.125 mg Oral BID WC  . Chlorhexidine Gluconate Cloth  6 each Topical Daily  . dexamethasone (DECADRON) injection  6 mg Intravenous Q24H  . furosemide  40 mg Oral Daily  . insulin aspart  0-20 Units Subcutaneous TID AC & HS  . insulin aspart  3 Units Subcutaneous TID with meals  . levothyroxine  100 mcg Oral QAC breakfast  . sodium bicarbonate  1,300 mg Oral BID  . sodium zirconium cyclosilicate  10 g Oral Daily  . traZODone  50 mg Oral QHS   Continuous Infusions: . piperacillin-tazobactam (ZOSYN)  IV 3.375 g (12/29/19 0551)     LOS: 7 days    Time spent: 34 minutes    Sharen Hones, MD [facility]   To contact the attending provider between 7A-7P or the covering provider during after hours 7P-7A, please log into the web site [email or web address] and access using universal [place] password for that web site. If you do not have the password, please call the hospital operator.  12/29/2019, 9:56 AM

## 2019-12-30 DIAGNOSIS — J1282 Pneumonia due to coronavirus disease 2019: Secondary | ICD-10-CM | POA: Diagnosis not present

## 2019-12-30 DIAGNOSIS — I5032 Chronic diastolic (congestive) heart failure: Secondary | ICD-10-CM | POA: Diagnosis not present

## 2019-12-30 DIAGNOSIS — J9601 Acute respiratory failure with hypoxia: Secondary | ICD-10-CM | POA: Diagnosis not present

## 2019-12-30 DIAGNOSIS — U071 COVID-19: Secondary | ICD-10-CM | POA: Diagnosis not present

## 2019-12-30 LAB — GLUCOSE, CAPILLARY
Glucose-Capillary: 140 mg/dL — ABNORMAL HIGH (ref 70–99)
Glucose-Capillary: 178 mg/dL — ABNORMAL HIGH (ref 70–99)
Glucose-Capillary: 202 mg/dL — ABNORMAL HIGH (ref 70–99)
Glucose-Capillary: 190 mg/dL — ABNORMAL HIGH (ref 70–99)

## 2019-12-30 LAB — PHOSPHORUS: Phosphorus: 5.3 mg/dL — ABNORMAL HIGH (ref 2.5–4.6)

## 2019-12-30 LAB — BASIC METABOLIC PANEL
Anion gap: 12 (ref 5–15)
BUN: 72 mg/dL — ABNORMAL HIGH (ref 6–20)
CO2: 20 mmol/L — ABNORMAL LOW (ref 22–32)
Calcium: 8.7 mg/dL — ABNORMAL LOW (ref 8.9–10.3)
Chloride: 98 mmol/L (ref 98–111)
Creatinine, Ser: 2.6 mg/dL — ABNORMAL HIGH (ref 0.61–1.24)
GFR, Estimated: 28 mL/min — ABNORMAL LOW (ref >60)
Glucose, Bld: 127 mg/dL — ABNORMAL HIGH (ref 70–99)
Potassium: 4.9 mmol/L (ref 3.5–5.1)
Sodium: 130 mmol/L — ABNORMAL LOW (ref 135–145)

## 2019-12-30 LAB — FERRITIN: Ferritin: 807 ng/mL — ABNORMAL HIGH (ref 24–336)

## 2019-12-30 LAB — TROPONIN I (HIGH SENSITIVITY): Troponin I (High Sensitivity): 67 ng/L — ABNORMAL HIGH (ref <18)

## 2019-12-30 LAB — MAGNESIUM: Magnesium: 1.9 mg/dL (ref 1.7–2.4)

## 2019-12-30 LAB — FIBRIN DERIVATIVES D-DIMER (ARMC ONLY): Fibrin derivatives D-dimer (ARMC): 745.71 ng/mL (FEU) — ABNORMAL HIGH (ref 0.00–499.00)

## 2019-12-30 MED ORDER — AMLODIPINE BESYLATE 10 MG PO TABS
10.0000 mg | ORAL_TABLET | Freq: Every day | ORAL | Status: DC
  Administered 2019-12-31 – 2020-01-19 (×20): 10 mg via ORAL
  Filled 2019-12-30 (×20): qty 1

## 2019-12-30 MED ORDER — BARICITINIB 1 MG PO TABS
1.0000 mg | ORAL_TABLET | Freq: Every day | ORAL | Status: DC
  Administered 2019-12-30 – 2020-01-02 (×4): 1 mg via ORAL
  Filled 2019-12-30 (×5): qty 1

## 2019-12-30 NOTE — Progress Notes (Addendum)
PHARMACY CONSULT NOTE  Pharmacy Consult for Electrolyte Monitoring and Replacement   Recent Labs: Potassium (mmol/L)  Date Value  12/30/2019 4.9  03/08/2014 3.9   Magnesium (mg/dL)  Date Value  12/30/2019 1.9   Calcium (mg/dL)  Date Value  12/30/2019 8.7 (L)   Calcium, Total (mg/dL)  Date Value  03/08/2014 7.9 (L)   Albumin (g/dL)  Date Value  12/26/2019 2.7 (L)  10/11/2018 4.2  03/07/2014 3.4   Phosphorus (mg/dL)  Date Value  12/30/2019 5.3 (H)   Sodium (mmol/L)  Date Value  12/30/2019 130 (L)  10/11/2018 141  03/08/2014 135 (L)   Corrected Ca: 8.9 mg/dL  Assessment: 54 y/o M with medical history including atrial fibrillation on apixaban, CHF, CKD, alcohol abuse who is admitted with severe COVID-19 pneumonia with ARDS.   Admission further complicated by AKI which is improving ; Scr slightly worse today. Holding home [place]. Home furosemide re-started 11/3. UOP 1250 mL yesterday. Potassium 5.6 (hemolyzed) today. Co2 18 (stable) on oral sodium bicarb supplementation. Hyperkalemia may be resultant of extracellular shift / possibly beta blocker / RTA. Patient started on Lokelma 10 g daily 11/4. Last bowel movement documented in chart was 10/29.   Goal of Therapy:  Potassium 4.0 - 5.1 mmol/L Magnesium 2.0 - 2.4 mg/dL All Other Electrolytes WNL  Plan:   No electrolyte replacement warranted today  Will defer management of hyperkalemia to primary provider / nephrology (improved today)  Na down to 130 today despite oral furosemide. BNP pending. Defer management to primary provider / nephrology  Re-check electrolytes in AM  Lu Duffel, PharmD, BCPS Clinical Pharmacist 12/30/2019 9:10 AM

## 2019-12-30 NOTE — Progress Notes (Signed)
PROGRESS NOTE    Hector [name]  TDV:[id number] DOB: 1965/05/31 DOA: 12/22/2019 PCP: [name], MD   Chief complaint.  Shortness of breath. Brief Narrative:  [name] is a 54 year old male with past medical history of congestive heart failure chronic kidney disease hyperlipidemia paroxysmal atrial fibrillation and alcohol use who presented to the emergency room today with 2 to 3 weeks history of shortness of breath associated with dry cough and generalized weakness. In the emergency room, his oxygen saturation was 70%, he was placed on 100% nonbreather and admitted to the ICU. He has been requiring heated high flow since being there. He was started on IV steroids, completed remdesivir. He was started on baricitinib, his CRP has normalized, baricitinib was discontinued.  I am assigned to this patient on 12/28/2019. Patient still high flow oxygen.  Normal BMP procalcitonin level less than 0.01.   Assessment & Plan:   Principal Problem:   Pneumonia due to COVID-19 virus Active Problems:   Hypertensive heart disease   Hyperlipidemia   Chronic kidney disease (CKD), stage III (moderate) (HCC)   Paroxysmal atrial fibrillation (HCC)   Chronic diastolic heart failure (HCC)   Essential hypertension   Nonischemic cardiomyopathy (HCC)   Chronic systolic heart failure (HCC)   Acute respiratory failure with hypoxemia (HCC)   Severe sepsis ([place])   COVID-19   Morbidly obese ([place])  #1.  Acute hypoxemic respiratory failure, ARDS, COVID-19 pneumonia. Patient does not have volume overload, no secondary bacterial pneumonia.  Chest x-ray showed a severe bilateral infiltrates consistent with ARDS. Patient was restarted on baricitinib, continue IV steroids. He is also on therapeutic Eliquis. Patient is still on heated high flow oxygen, condition still serious, but oxygen use has dropped down to 40%.  Less likely to deteriorate into mechanical ventilation.  Will transfer  to progressive unit.  2.  Acute kidney injury on chronic kidney disease stage IIIb, metabolic acidosis, hyperkalemia, hyponatremia.  Patient is managed by nephrology.  3.  Chronic combined systolic and diastolic congestive heart failure. Appears to be stable.  4.  Severe sepsis. Condition improved.  5.  Paroxysmal atrial fibrillation. Continue anticoagulation.    DVT prophylaxis: Eliquis Code Status: Full Family Communication: None Disposition Plan:     Status is: Inpatient  Remains inpatient appropriate because:Inpatient level of care appropriate due to severity of illness   Dispo: The patient is from: Home              Anticipated d/c is to: Home              Anticipated d/c date is: > 3 days              Patient currently is not medically stable to d/c.        I/O last 3 completed shifts: In: 519.7 [P.O.:420; IV Piggyback:99.7] Out: 1050 [Urine:1050] Total I/O In: 400 [P.O.:400] Out: 43 [Urine:450]     Consultants:   Nephrology  Procedures: None  Antimicrobials: None  Subjective: Patient feels better.  He feels shortness of breath with exertion, but better than yesterday.  Cough, nonproductive. He still unwilling to lay flat, but is willing to sleep on his side.  Refused to get to the chair. No abdominal pain nausea vomiting, good appetite. No dysuria hematuria. No fever chills.  Objective: Vitals:   12/30/19 0900 12/30/19 1000 12/30/19 1100 12/30/19 1200  BP: (!) 155/108 (!) 146/111 (!) 143/105 (!) 140/106  Pulse: 87 88 89 94  Resp: 18 17 (!)  22 (!) 24  Temp:      TempSrc:      SpO2: 94% 93% 92% 92%  Weight:      Height:        Intake/Output Summary (Last 24 hours) at 12/30/2019 1248 Last data filed at 12/30/2019 1200 Gross per 24 hour  Intake 520 ml  Output 1050 ml  Net -530 ml   Filed Weights   12/22/19 2100 12/23/19 0500 12/24/19 0500  Weight: 123.6 kg 123.6 kg 123.6 kg    Examination:  General exam: Appears calm and  comfortable  Respiratory system: Clear to auscultation. Respiratory effort normal. Cardiovascular system: S1 & S2 heard, RRR. No JVD, murmurs, rubs, gallops or clicks. No pedal edema. Gastrointestinal system: Abdomen is nondistended, soft and nontender. No organomegaly or masses felt. Normal bowel sounds heard. Central nervous system: Alert and oriented. No focal neurological deficits. Extremities: Symmetric Skin: No rashes, lesions or ulcers Psychiatry: Mood & affect appropriate.     Data Reviewed: I have personally reviewed following labs and imaging studies  CBC: Recent Labs  Lab 12/25/19 0529 12/26/19 0711 12/27/19 0458 12/28/19 0802 12/29/19 0633  WBC 9.8 11.5* 13.4* 18.5* 18.8*  NEUTROABS  --   --  9.9* 13.9* 14.2*  HGB 14.0 13.7 13.8 15.2 15.5  HCT 40.8 40.5 41.7 46.7 47.4  MCV 84.5 86.2 85.5 87.1 87.1  PLT 328 335 377 349 081*   Basic Metabolic Panel: Recent Labs  Lab 12/25/19 0529 12/25/19 0529 12/26/19 0711 12/27/19 0458 12/28/19 0802 12/29/19 0633 12/30/19 0014  NA 133*   < > 134* 136 134* 132* 130*  K 4.8   < > 4.9 5.1 5.4* 5.6* 4.9  CL 103   < > 107 108 104 102 98  CO2 20*   < > 18* 18* 18* 18* 20*  GLUCOSE 136*   < > 112* 127* 140* 129* 127*  BUN 66*   < > 62* 57* 59* 61* 72*  CREATININE 2.49*   < > 2.25* 2.18* 2.22* 2.44* 2.60*  CALCIUM 7.8*   < > 8.1* 8.7* 8.8* 8.9 8.7*  MG 2.5*   < > 2.6* 2.4 2.1 2.0 1.9  PHOS 3.8  --  3.6 3.8 4.3  --  5.3*   < > = values in this interval not displayed.   GFR: Estimated Creatinine Clearance: 43 mL/min (A) (by C-G formula based on SCr of 2.6 mg/dL (H)). Liver Function Tests: Recent Labs  Lab 12/24/19 0142 12/25/19 0529 12/26/19 0711  ALBUMIN 2.6* 2.6* 2.7*   No results for input(s): LIPASE, AMYLASE in the last 168 hours. No results for input(s): AMMONIA in the last 168 hours. Coagulation Profile: No results for input(s): INR, PROTIME in the last 168 hours. Cardiac Enzymes: No results for input(s):  CKTOTAL, CKMB, CKMBINDEX, TROPONINI in the last 168 hours. BNP (last 3 results) No results for input(s): PROBNP in the last 8760 hours. HbA1C: No results for input(s): HGBA1C in the last 72 hours. CBG: Recent Labs  Lab 12/29/19 1259 12/29/19 1650 12/29/19 2111 12/30/19 0854 12/30/19 1154  GLUCAP 164* 218* 211* 140* 178*   Lipid Profile: No results for input(s): CHOL, HDL, LDLCALC, TRIG, CHOLHDL, LDLDIRECT in the last 72 hours. Thyroid Function Tests: No results for input(s): TSH, T4TOTAL, FREET4, T3FREE, THYROIDAB in the last 72 hours. Anemia Panel: Recent Labs    12/29/19 0633 12/30/19 0014  FERRITIN 837* 807*   Sepsis Labs: Recent Labs  Lab 12/24/19 0142 12/29/19 0633  PROCALCITON 0.99 <0.10  LATICACIDVEN 1.8  --     Recent Results (from the past 240 hour(s))  Blood Culture (routine x 2)     Status: None   Collection Time: 12/22/19  8:49 AM   Specimen: BLOOD  Result Value Ref Range Status   Specimen Description BLOOD RT [facility]  Final   Special Requests   Final    BOTTLES DRAWN AEROBIC AND ANAEROBIC Blood Culture adequate volume   Culture   Final    NO GROWTH 5 DAYS Performed at [facility], [address] [place], [place] [address]    Report Status 12/27/2019 FINAL  Final  Blood Culture (routine x 2)     Status: None   Collection Time: 12/22/19  8:49 AM   Specimen: BLOOD  Result Value Ref Range Status   Specimen Description BLOOD RT HAND  Final   Special Requests   Final    BOTTLES DRAWN AEROBIC AND ANAEROBIC Blood Culture adequate volume   Culture   Final    NO GROWTH 5 DAYS Performed at [facility], [address], [place], [place] [address]    Report Status 12/27/2019 FINAL  Final  Urine culture     Status: None   Collection Time: 12/22/19  8:49 AM   Specimen: Urine, Random  Result Value Ref Range Status   Specimen Description   Final    URINE, RANDOM Performed at [facility], [address] [place], [place]  [address]    Special Requests   Final    NONE Performed at [facility], [address] [place], [place] [address]    Culture   Final    NO GROWTH Performed at [facility], [place] [address] [place], [place] [address]    Report Status 12/23/2019 FINAL  Final  Respiratory Panel by RT PCR (Flu A&B, Covid) - Nasopharyngeal Swab     Status: Abnormal   Collection Time: 12/22/19  8:49 AM   Specimen: Nasopharyngeal Swab  Result Value Ref Range Status   SARS Coronavirus 2 by RT PCR POSITIVE (A) NEGATIVE Final    Comment: RESULT CALLED TO, READ BACK BY AND VERIFIED WITH: KENDALL FLANNIGAN AT 1856 ON 12/22/2019 [place]. (NOTE) SARS-CoV-2 target nucleic acids are DETECTED.  SARS-CoV-2 RNA is generally detectable in upper respiratory specimens  during the acute phase of infection. Positive results are indicative of the presence of the identified virus, but do not rule out bacterial infection or co-infection with other pathogens not detected by the test. Clinical correlation with patient history and other diagnostic information is necessary to determine patient infection status. The expected result is Negative.  Fact Sheet for Patients:  PinkCheek.be  Fact Sheet for Healthcare Providers: GravelBags.it  This test is not yet approved or cleared by the [place] FDA and  has been authorized for detection and/or diagnosis of SARS-CoV-2 by FDA under an Emergency Use Authorization (EUA).  This EUA will remain in effect (meaning this te st can be used) for the duration of  the COVID-19 declaration under Section 564(b)(1) of the Act, 21 U.S.C. section 360bbb-3(b)(1), unless the authorization is terminated or revoked sooner.      Influenza A by PCR NEGATIVE NEGATIVE Final   Influenza B by PCR NEGATIVE NEGATIVE Final    Comment: (NOTE) The Xpert Xpress SARS-CoV-2/FLU/RSV assay is intended as an aid in  the diagnosis of  influenza from Nasopharyngeal swab specimens and  should not be used as a sole basis for treatment. Nasal washings  and  aspirates are unacceptable for Xpert Xpress SARS-CoV-2/FLU/RSV  testing.  Fact Sheet for Patients: PinkCheek.be  Fact Sheet for Healthcare Providers: GravelBags.it  This test is not yet approved or cleared by the [place] FDA and  has been authorized for detection and/or diagnosis of SARS-CoV-2 by  FDA under an Emergency Use Authorization (EUA). This EUA will remain  in effect (meaning this test can be used) for the duration of the  Covid-19 declaration under Section 564(b)(1) of the Act, 21  U.S.C. section 360bbb-3(b)(1), unless the authorization is  terminated or revoked. Performed at [facility], [address] [place], [place] [address]   MRSA PCR Screening     Status: None   Collection Time: 12/22/19  9:16 PM   Specimen: Nasal Mucosa; Nasopharyngeal  Result Value Ref Range Status   MRSA by PCR NEGATIVE NEGATIVE Final    Comment:        The GeneXpert MRSA Assay (FDA approved for NASAL specimens only), is one component of a comprehensive MRSA colonization surveillance program. It is not intended to diagnose MRSA infection nor to guide or monitor treatment for MRSA infections. Performed at [facility], [address], [place], [place] [address]          Radiology Studies: No results found.      Scheduled Meds:  allopurinol  100 mg Oral Daily   amiodarone  200 mg Oral Daily   [START ON 12/31/2019] amLODipine  10 mg Oral Daily   apixaban  5 mg Oral BID   atorvastatin  80 mg Oral QHS   baricitinib  1 mg Oral Daily   carvedilol  3.125 mg Oral BID WC   Chlorhexidine Gluconate Cloth  6 each Topical Daily   dexamethasone (DECADRON) injection  6 mg Intravenous Q24H   insulin aspart  0-20 Units Subcutaneous TID AC & HS   insulin aspart  3 Units  Subcutaneous TID with meals   levothyroxine  100 mcg Oral QAC breakfast   sodium bicarbonate  1,300 mg Oral BID   sodium zirconium cyclosilicate  10 g Oral Daily   traZODone  50 mg Oral QHS   Continuous Infusions:   LOS: 8 days    Time spent: 28 minutes    Sharen Hones, MD [facility]   To contact the attending provider between 7A-7P or the covering provider during after hours 7P-7A, please log into the web site [email or web address] and access using universal [place] password for that web site. If you do not have the password, please call the hospital operator.  12/30/2019, 12:48 PM

## 2019-12-30 NOTE — Progress Notes (Signed)
[facility]  ROUNDING NOTE   Subjective:   Patient is still critically ill.  Requiring high flow oxygen  Denies any acute complaints Has not had a bowel movement yet-refusing laxatives States he is able to eat some without nausea or vomiting Dark yellow urine noted in urine bag   Objective:  Vital signs in last 24 hours:  Temp:  [98 F (36.7 C)] 98 F (36.7 C) (11/06 0400) Pulse Rate:  [51-81] 68 (11/06 0700) Resp:  [13-29] 13 (11/06 0700) BP: (123-161)/(95-116) 160/114 (11/06 0700) SpO2:  [91 %-98 %] 95 % (11/06 0700) FiO2 (%):  [40 %] 40 % (11/06 0823)  Weight change:  Filed Weights   12/22/19 2100 12/23/19 0500 12/24/19 0500  Weight: 123.6 kg 123.6 kg 123.6 kg    Intake/Output: I/O last 3 completed shifts: In: 219.7 [P.O.:120; IV Piggyback:99.7] Out: 1050 [Urine:1050]   Intake/Output this shift:  No intake/output data recorded.  Physical Exam: General: ill  Head: Normocephalic, atraumatic. Moist oral mucosal membranes  Eyes: Anicteric,   Lungs:  +coarse breath sounds, HFNC 45%  Heart: Regular rate and rhythm  Abdomen:  Soft, nontender  Extremities: No peripheral edema.  Neurologic: Nonfocal, moving all four extremities  Skin: No lesions        Basic Metabolic Panel: Recent Labs  Lab 12/25/19 0529 12/25/19 0529 12/26/19 0711 12/26/19 0711 12/27/19 0458 12/27/19 0458 12/28/19 0802 12/29/19 0633 12/30/19 0014  NA 133*   < > 134*  --  136  --  134* 132* 130*  K 4.8   < > 4.9  --  5.1  --  5.4* 5.6* 4.9  CL 103   < > 107  --  108  --  104 102 98  CO2 20*   < > 18*  --  18*  --  18* 18* 20*  GLUCOSE 136*   < > 112*  --  127*  --  140* 129* 127*  BUN 66*   < > 62*  --  57*  --  59* 61* 72*  CREATININE 2.49*   < > 2.25*  --  2.18*  --  2.22* 2.44* 2.60*  CALCIUM 7.8*   < > 8.1*   < > 8.7*   < > 8.8* 8.9 8.7*  MG 2.5*   < > 2.6*  --  2.4  --  2.1 2.0 1.9  PHOS 3.8  --  3.6  --  3.8  --  4.3  --  5.3*   < > = values in this interval not  displayed.    Liver Function Tests: Recent Labs  Lab 12/24/19 0142 12/25/19 0529 12/26/19 0711  ALBUMIN 2.6* 2.6* 2.7*   No results for input(s): LIPASE, AMYLASE in the last 168 hours. No results for input(s): AMMONIA in the last 168 hours.  CBC: Recent Labs  Lab 12/25/19 0529 12/26/19 0711 12/27/19 0458 12/28/19 0802 12/29/19 0633  WBC 9.8 11.5* 13.4* 18.5* 18.8*  NEUTROABS  --   --  9.9* 13.9* 14.2*  HGB 14.0 13.7 13.8 15.2 15.5  HCT 40.8 40.5 41.7 46.7 47.4  MCV 84.5 86.2 85.5 87.1 87.1  PLT 328 335 377 349 405*    Cardiac Enzymes: No results for input(s): CKTOTAL, CKMB, CKMBINDEX, TROPONINI in the last 168 hours.  BNP: Invalid input(s): POCBNP  CBG: Recent Labs  Lab 12/29/19 0901 12/29/19 1259 12/29/19 1650 12/29/19 2111 12/30/19 0854  GLUCAP 125* 164* 218* 211* 140*    Microbiology: Results for orders placed or  performed during the hospital encounter of 12/22/19  Blood Culture (routine x 2)     Status: None   Collection Time: 12/22/19  8:49 AM   Specimen: BLOOD  Result Value Ref Range Status   Specimen Description BLOOD RT [facility]  Final   Special Requests   Final    BOTTLES DRAWN AEROBIC AND ANAEROBIC Blood Culture adequate volume   Culture   Final    NO GROWTH 5 DAYS Performed at [facility], [address], [place], [place] [address]    Report Status 12/27/2019 FINAL  Final  Blood Culture (routine x 2)     Status: None   Collection Time: 12/22/19  8:49 AM   Specimen: BLOOD  Result Value Ref Range Status   Specimen Description BLOOD RT HAND  Final   Special Requests   Final    BOTTLES DRAWN AEROBIC AND ANAEROBIC Blood Culture adequate volume   Culture   Final    NO GROWTH 5 DAYS Performed at [facility], [address], [place], [place] [address]    Report Status 12/27/2019 FINAL  Final  Urine culture     Status: None   Collection Time: 12/22/19  8:49 AM   Specimen: Urine, Random  Result Value Ref Range Status    Specimen Description   Final    URINE, RANDOM Performed at [facility], [address], [place], [place] [address]    Special Requests   Final    NONE Performed at [facility], [address] [place], [place] [address]    Culture   Final    NO GROWTH Performed at [facility], [place] [address], [place], [place] [address]    Report Status 12/23/2019 FINAL  Final  Respiratory Panel by RT PCR (Flu A&B, Covid) - Nasopharyngeal Swab     Status: Abnormal   Collection Time: 12/22/19  8:49 AM   Specimen: Nasopharyngeal Swab  Result Value Ref Range Status   SARS Coronavirus 2 by RT PCR POSITIVE (A) NEGATIVE Final    Comment: RESULT CALLED TO, READ BACK BY AND VERIFIED WITH: KENDALL FLANNIGAN AT 7893 ON 12/22/2019 [place]. (NOTE) SARS-CoV-2 target nucleic acids are DETECTED.  SARS-CoV-2 RNA is generally detectable in upper respiratory specimens  during the acute phase of infection. Positive results are indicative of the presence of the identified virus, but do not rule out bacterial infection or co-infection with other pathogens not detected by the test. Clinical correlation with patient history and other diagnostic information is necessary to determine patient infection status. The expected result is Negative.  Fact Sheet for Patients:  PinkCheek.be  Fact Sheet for Healthcare Providers: GravelBags.it  This test is not yet approved or cleared by the [place] FDA and  has been authorized for detection and/or diagnosis of SARS-CoV-2 by FDA under an Emergency Use Authorization (EUA).  This EUA will remain in effect (meaning this te st can be used) for the duration of  the COVID-19 declaration under Section 564(b)(1) of the Act, 21 U.S.C. section 360bbb-3(b)(1), unless the authorization is terminated or revoked sooner.      Influenza A by PCR NEGATIVE NEGATIVE Final   Influenza B by PCR NEGATIVE  NEGATIVE Final    Comment: (NOTE) The Xpert Xpress SARS-CoV-2/FLU/RSV assay is intended as an aid in  the diagnosis of influenza from Nasopharyngeal swab specimens and  should not be used as a sole basis for treatment. Nasal washings and  aspirates are unacceptable for Xpert Xpress  SARS-CoV-2/FLU/RSV  testing.  Fact Sheet for Patients: PinkCheek.be  Fact Sheet for Healthcare Providers: GravelBags.it  This test is not yet approved or cleared by the [place] FDA and  has been authorized for detection and/or diagnosis of SARS-CoV-2 by  FDA under an Emergency Use Authorization (EUA). This EUA will remain  in effect (meaning this test can be used) for the duration of the  Covid-19 declaration under Section 564(b)(1) of the Act, 21  U.S.C. section 360bbb-3(b)(1), unless the authorization is  terminated or revoked. Performed at [facility], [address] [place], [place] [address]   MRSA PCR Screening     Status: None   Collection Time: 12/22/19  9:16 PM   Specimen: Nasal Mucosa; Nasopharyngeal  Result Value Ref Range Status   MRSA by PCR NEGATIVE NEGATIVE Final    Comment:        The GeneXpert MRSA Assay (FDA approved for NASAL specimens only), is one component of a comprehensive MRSA colonization surveillance program. It is not intended to diagnose MRSA infection nor to guide or monitor treatment for MRSA infections. Performed at [facility], [address] [place], [place] [address]     Coagulation Studies: No results for input(s): LABPROT, INR in the last 72 hours.  Urinalysis: No results for input(s): COLORURINE, LABSPEC, PHURINE, GLUCOSEU, HGBUR, BILIRUBINUR, KETONESUR, PROTEINUR, UROBILINOGEN, NITRITE, LEUKOCYTESUR in the last 72 hours.  Invalid input(s): APPERANCEUR    Imaging: No results found.   Medications:    . allopurinol  100 mg Oral Daily  . amiodarone  200 mg  Oral Daily  . amLODipine  5 mg Oral Daily  . apixaban  5 mg Oral BID  . atorvastatin  80 mg Oral QHS  . baricitinib  1 mg Oral Daily  . carvedilol  3.125 mg Oral BID WC  . Chlorhexidine Gluconate Cloth  6 each Topical Daily  . dexamethasone (DECADRON) injection  6 mg Intravenous Q24H  . furosemide  40 mg Oral Daily  . insulin aspart  0-20 Units Subcutaneous TID AC & HS  . insulin aspart  3 Units Subcutaneous TID with meals  . levothyroxine  100 mcg Oral QAC breakfast  . sodium bicarbonate  1,300 mg Oral BID  . sodium zirconium cyclosilicate  10 g Oral Daily  . traZODone  50 mg Oral QHS   docusate sodium, guaiFENesin-dextromethorphan, hydrALAZINE, HYDROcodone-acetaminophen, polyethylene glycol  Assessment/ Plan:  [name] is a 54 y.o. black male with atrial fibrillation, hyperlipidemia, systolic congestive heart failure, alcohol abuse, hypothyroidism who was admitted to [facility] on 12/22/2019 for Hypoxia [R09.02] Pneumonia due to COVID-19 virus [U07.1, J12.82] COVID-19 [U07.1]   1. Acute kidney injury with hyperkalemia, metabolic acidosis and hyponatremia on chronic kidney disease stage IIIB. Baseline creatinine of 2.37, GFR of 30 on 02/08/2019. Followed by [facility].   Creatinine back to baseline -We will hold further furosemide as patient appears euvolemic and oral intake appears to be low - holding entresto due to aki and hyperkalemia - Encourage PO intake. Low potassium diet.  - Continue sodium bicarbonate PO bid - Continue Lokelma   2. Hypertension: with urgency on admission. Current regimen of carvedilol.  Elevated at 140/103 - steroid driven - IV hydralazine PRN - Amlodipine started this admission.  Increased to 10 mg today  3.  COVID-19 pneumonia Diagnosed 12/22/2019 Unvaccinated Currently getting treatment with IV dexamethasone, baricitinib Requiring high flow Oxygen   LOS: 8 Sahory Nordling 11/6/20219:36 AM

## 2019-12-30 NOTE — Progress Notes (Signed)
PHARMACY NOTE:  ANTIMICROBIAL RENAL DOSAGE ADJUSTMENT  Current antimicrobial regimen includes a mismatch between antimicrobial dosage and estimated renal function.  As per policy approved by the Pharmacy & Therapeutics and Medical Executive Committees, the antimicrobial dosage will be adjusted accordingly.  Current antimicrobial dosage:  Baricitinib 2mg  qd  Indication: COVID  Renal Function: GFR 28 ml/min  Estimated Creatinine Clearance: 43 mL/min (A) (by C-G formula based on SCr of 2.6 mg/dL (H)).     Antimicrobial dosage has been changed to:  Baricitinib 1mg   Additional comments:   Thank you for allowing pharmacy to be a part of this patient's care.  Lu Duffel, PharmD, BCPS Clinical Pharmacist 12/30/2019 9:08 AM

## 2019-12-31 DIAGNOSIS — A419 Sepsis, unspecified organism: Secondary | ICD-10-CM | POA: Diagnosis not present

## 2019-12-31 DIAGNOSIS — J1282 Pneumonia due to coronavirus disease 2019: Secondary | ICD-10-CM | POA: Diagnosis not present

## 2019-12-31 DIAGNOSIS — U071 COVID-19: Secondary | ICD-10-CM | POA: Diagnosis not present

## 2019-12-31 DIAGNOSIS — J80 Acute respiratory distress syndrome: Secondary | ICD-10-CM

## 2019-12-31 DIAGNOSIS — E875 Hyperkalemia: Secondary | ICD-10-CM

## 2019-12-31 DIAGNOSIS — E871 Hypo-osmolality and hyponatremia: Secondary | ICD-10-CM

## 2019-12-31 DIAGNOSIS — R6520 Severe sepsis without septic shock: Secondary | ICD-10-CM

## 2019-12-31 DIAGNOSIS — N1832 Chronic kidney disease, stage 3b: Secondary | ICD-10-CM

## 2019-12-31 DIAGNOSIS — J9601 Acute respiratory failure with hypoxia: Secondary | ICD-10-CM | POA: Diagnosis not present

## 2019-12-31 DIAGNOSIS — N179 Acute kidney failure, unspecified: Secondary | ICD-10-CM

## 2019-12-31 LAB — FERRITIN: Ferritin: 696 ng/mL — ABNORMAL HIGH (ref 24–336)

## 2019-12-31 LAB — CBC WITH DIFFERENTIAL/PLATELET
Abs Immature Granulocytes: 1.19 10*3/uL — ABNORMAL HIGH (ref 0.00–0.07)
Basophils Absolute: 0.1 10*3/uL (ref 0.0–0.1)
Basophils Relative: 1 %
Eosinophils Absolute: 0 10*3/uL (ref 0.0–0.5)
Eosinophils Relative: 0 %
HCT: 45.8 % (ref 39.0–52.0)
Hemoglobin: 15.2 g/dL (ref 13.0–17.0)
Immature Granulocytes: 6 %
Lymphocytes Relative: 8 %
Lymphs Abs: 1.7 10*3/uL (ref 0.7–4.0)
MCH: 28.5 pg (ref 26.0–34.0)
MCHC: 33.2 g/dL (ref 30.0–36.0)
MCV: 85.9 fL (ref 80.0–100.0)
Monocytes Absolute: 1.6 10*3/uL — ABNORMAL HIGH (ref 0.1–1.0)
Monocytes Relative: 8 %
Neutro Abs: 15.3 10*3/uL — ABNORMAL HIGH (ref 1.7–7.7)
Neutrophils Relative %: 77 %
Platelets: 413 10*3/uL — ABNORMAL HIGH (ref 150–400)
RBC: 5.33 MIL/uL (ref 4.22–5.81)
RDW: 14.4 % (ref 11.5–15.5)
Smear Review: NORMAL
WBC: 19.8 10*3/uL — ABNORMAL HIGH (ref 4.0–10.5)
nRBC: 0 % (ref 0.0–0.2)

## 2019-12-31 LAB — BASIC METABOLIC PANEL
Anion gap: 10 (ref 5–15)
BUN: 87 mg/dL — ABNORMAL HIGH (ref 6–20)
CO2: 22 mmol/L (ref 22–32)
Calcium: 8.6 mg/dL — ABNORMAL LOW (ref 8.9–10.3)
Chloride: 101 mmol/L (ref 98–111)
Creatinine, Ser: 3.02 mg/dL — ABNORMAL HIGH (ref 0.61–1.24)
GFR, Estimated: 24 mL/min — ABNORMAL LOW (ref >60)
Glucose, Bld: 116 mg/dL — ABNORMAL HIGH (ref 70–99)
Potassium: 5 mmol/L (ref 3.5–5.1)
Sodium: 133 mmol/L — ABNORMAL LOW (ref 135–145)

## 2019-12-31 LAB — C-REACTIVE PROTEIN
CRP: 0.5 mg/dL (ref <1.0)
CRP: <0.5 mg/dL (ref <1.0)

## 2019-12-31 LAB — GLUCOSE, CAPILLARY
Glucose-Capillary: 116 mg/dL — ABNORMAL HIGH (ref 70–99)
Glucose-Capillary: 127 mg/dL — ABNORMAL HIGH (ref 70–99)
Glucose-Capillary: 157 mg/dL — ABNORMAL HIGH (ref 70–99)
Glucose-Capillary: 183 mg/dL — ABNORMAL HIGH (ref 70–99)

## 2019-12-31 LAB — FIBRIN DERIVATIVES D-DIMER (ARMC ONLY): Fibrin derivatives D-dimer (ARMC): 829.23 ng/mL (FEU) — ABNORMAL HIGH (ref 0.00–499.00)

## 2019-12-31 LAB — MAGNESIUM: Magnesium: 2.3 mg/dL (ref 1.7–2.4)

## 2019-12-31 MED ORDER — ALLOPURINOL 100 MG PO TABS
50.0000 mg | ORAL_TABLET | Freq: Every day | ORAL | Status: DC
  Administered 2019-12-31 – 2020-01-19 (×20): 50 mg via ORAL
  Filled 2019-12-31: qty 1
  Filled 2019-12-31: qty 0.5
  Filled 2019-12-31: qty 1
  Filled 2019-12-31 (×2): qty 0.5
  Filled 2019-12-31: qty 1
  Filled 2019-12-31: qty 0.5
  Filled 2019-12-31 (×2): qty 1
  Filled 2019-12-31 (×2): qty 0.5
  Filled 2019-12-31 (×4): qty 1
  Filled 2019-12-31: qty 0.5
  Filled 2019-12-31 (×4): qty 1

## 2019-12-31 NOTE — Progress Notes (Signed)
PT Cancellation Note  Patient Details Name: Hector [name] MRN: [id number] DOB: 06-28-1965   Cancelled Treatment:    Reason Eval/Treat Not Completed: Patient declined, no reason specified. Patient reports that he does not feel up to it today and will participate tomorrow. Patient was encouraged to participate but he refused.    Alanson Puls, [place] DPT 12/31/2019, 8:37 AM

## 2019-12-31 NOTE — Progress Notes (Signed)
PROGRESS NOTE    Hector [name]  EHO:[id number] DOB: Jul 08, 1965 DOA: 12/22/2019 PCP: Hector Median, MD   Chief complaint.  Shortness of breath. Brief Narrative:  [name] is a 54 year old male with past medical history of congestive heart failure chronic kidney disease hyperlipidemia paroxysmal atrial fibrillation and alcohol use who presented to the emergency room today with 2 to 3 weeks history of shortness of breath associated with dry cough and generalized weakness. In the emergency room, his oxygen saturation was 70%, he was placed on 100% nonbreather and admitted to the ICU. He has been requiring heated high flow since being there. He was started on IV steroids, completed remdesivir. He was started on baricitinib, his CRP has normalized, baricitinib was discontinued.  I am assigned to this patient on 12/28/2019. Patient still high flow oxygen.Normal BMP procalcitonin level less than 0.01.   Assessment & Plan:   Principal Problem:   Pneumonia due to COVID-19 virus Active Problems:   Hypertensive heart disease   Hyperlipidemia   Chronic kidney disease (CKD), stage III (moderate) (HCC)   Paroxysmal atrial fibrillation (HCC)   Chronic diastolic heart failure (HCC)   Essential hypertension   Nonischemic cardiomyopathy (HCC)   Chronic systolic heart failure (HCC)   Acute respiratory failure with hypoxemia (HCC)   Severe sepsis ([place])   COVID-19   Morbidly obese ([place])  #1.  Acute hypoxemic respiratory failure, ARDS, COVID-19 pneumonia. Patient still has no evidence of volume overload, no evidence of bacterial pneumonia. Condition finally improved.  Patient currently on 6 L oxygen.  We will continue baricitinib and IV steroids. Continue therapeutic Eliquis.  2.  Pure acute kidney injury on chronic kidney disease stage IIIb, metabolic acidosis, hyperkalemia and hyponatremia. Appreciate nephrology management.  3.  Chronic combined systolic and  diastolic congestive heart failure. Stable.  4.  Severe sepsis. Condition has improved  5.  Paroxysmal atrial fibrillation. Continue anticoagulation.     DVT prophylaxis: Eliquis Code Status: Full Family Communication: None Disposition Plan:  .   Status is: Inpatient  Remains inpatient appropriate because:Inpatient level of care appropriate due to severity of illness   Dispo: The patient is from: Home              Anticipated d/c is to: Home              Anticipated d/c date is: 3 days              Patient currently is not medically stable to d/c.        I/O last 3 completed shifts: In: 620 [P.O.:620] Out: 2650 [Urine:2650] No intake/output data recorded.     Consultants:   Nephrology  Procedures: None  Antimicrobials: None  Subjective: Patient condition finally improved, currently he is on 6 L oxygen.  No significant short of breath.  He was able to sit up. Denies any abdominal pain or nausea vomiting.  No diarrhea constipation. No fever or chills. Denies any dysuria hematuria. Denies any headaches or dizziness.  Objective: Vitals:   12/30/19 2047 12/31/19 0516 12/31/19 0516 12/31/19 0816  BP: (!) 150/102  (!) 149/112 (!) 162/99  Pulse: 72  86 79  Resp: 19  19 18   Temp: 98.6 F (37 C)  98.9 F (37.2 C) 98.4 F (36.9 C)  TempSrc: Oral  Oral Oral  SpO2: 97%  95% 97%  Weight:  122.2 kg    Height:        Intake/Output Summary (Last 24 hours) at  12/31/2019 1039 Last data filed at 12/31/2019 0500 Gross per 24 hour  Intake 100 ml  Output 2050 ml  Net -1950 ml   Filed Weights   12/24/19 0500 12/30/19 1416 12/31/19 0516  Weight: 123.6 kg 124.4 kg 122.2 kg    Examination:  General exam: Appears calm and comfortable  Respiratory system: Clear to auscultation. Respiratory effort normal. Cardiovascular system: S1 & S2 heard, RRR. No JVD, murmurs, rubs, gallops or clicks. No pedal edema. Gastrointestinal system: Abdomen is nondistended, soft  and nontender. No organomegaly or masses felt. Normal bowel sounds heard. Central nervous system: Alert and oriented x3. No focal neurological deficits. Extremities: Symmetric 5 x 5 power. Skin: No rashes, lesions or ulcers Psychiatry:  Mood & affect appropriate.     Data Reviewed: I have personally reviewed following labs and imaging studies  CBC: Recent Labs  Lab 12/26/19 0711 12/27/19 0458 12/28/19 0802 12/29/19 0633 12/31/19 0415  WBC 11.5* 13.4* 18.5* 18.8* 19.8*  NEUTROABS  --  9.9* 13.9* 14.2* 15.3*  HGB 13.7 13.8 15.2 15.5 15.2  HCT 40.5 41.7 46.7 47.4 45.8  MCV 86.2 85.5 87.1 87.1 85.9  PLT 335 377 349 405* 478*   Basic Metabolic Panel: Recent Labs  Lab 12/25/19 0529 12/25/19 0529 12/26/19 0711 12/26/19 0711 12/27/19 0458 12/28/19 0802 12/29/19 0633 12/30/19 0014 12/31/19 0415  NA 133*   < > 134*   < > 136 134* 132* 130* 133*  K 4.8   < > 4.9   < > 5.1 5.4* 5.6* 4.9 5.0  CL 103   < > 107   < > 108 104 102 98 101  CO2 20*   < > 18*   < > 18* 18* 18* 20* 22  GLUCOSE 136*   < > 112*   < > 127* 140* 129* 127* 116*  BUN 66*   < > 62*   < > 57* 59* 61* 72* 87*  CREATININE 2.49*   < > 2.25*   < > 2.18* 2.22* 2.44* 2.60* 3.02*  CALCIUM 7.8*   < > 8.1*   < > 8.7* 8.8* 8.9 8.7* 8.6*  MG 2.5*   < > 2.6*   < > 2.4 2.1 2.0 1.9 2.3  PHOS 3.8  --  3.6  --  3.8 4.3  --  5.3*  --    < > = values in this interval not displayed.   GFR: Estimated Creatinine Clearance: 36.8 mL/min (A) (by C-G formula based on SCr of 3.02 mg/dL (H)). Liver Function Tests: Recent Labs  Lab 12/25/19 0529 12/26/19 0711  ALBUMIN 2.6* 2.7*   No results for input(s): LIPASE, AMYLASE in the last 168 hours. No results for input(s): AMMONIA in the last 168 hours. Coagulation Profile: No results for input(s): INR, PROTIME in the last 168 hours. Cardiac Enzymes: No results for input(s): CKTOTAL, CKMB, CKMBINDEX, TROPONINI in the last 168 hours. BNP (last 3 results) No results for input(s):  PROBNP in the last 8760 hours. HbA1C: No results for input(s): HGBA1C in the last 72 hours. CBG: Recent Labs  Lab 12/30/19 0854 12/30/19 1154 12/30/19 1713 12/30/19 2045 12/31/19 0813  GLUCAP 140* 178* 202* 190* 116*   Lipid Profile: No results for input(s): CHOL, HDL, LDLCALC, TRIG, CHOLHDL, LDLDIRECT in the last 72 hours. Thyroid Function Tests: No results for input(s): TSH, T4TOTAL, FREET4, T3FREE, THYROIDAB in the last 72 hours. Anemia Panel: Recent Labs    12/30/19 0014 12/31/19 0415  FERRITIN 807* 696*   Sepsis  Labs: Recent Labs  Lab 12/29/19 0630  PROCALCITON <0.10    Recent Results (from the past 240 hour(s))  Blood Culture (routine x 2)     Status: None   Collection Time: 12/22/19  8:49 AM   Specimen: BLOOD  Result Value Ref Range Status   Specimen Description BLOOD RT [facility]  Final   Special Requests   Final    BOTTLES DRAWN AEROBIC AND ANAEROBIC Blood Culture adequate volume   Culture   Final    NO GROWTH 5 DAYS Performed at [facility], [address], [place], [place] [address]    Report Status 12/27/2019 FINAL  Final  Blood Culture (routine x 2)     Status: None   Collection Time: 12/22/19  8:49 AM   Specimen: BLOOD  Result Value Ref Range Status   Specimen Description BLOOD RT HAND  Final   Special Requests   Final    BOTTLES DRAWN AEROBIC AND ANAEROBIC Blood Culture adequate volume   Culture   Final    NO GROWTH 5 DAYS Performed at [facility], [address], [place], [place] [address]    Report Status 12/27/2019 FINAL  Final  Urine culture     Status: None   Collection Time: 12/22/19  8:49 AM   Specimen: Urine, Random  Result Value Ref Range Status   Specimen Description   Final    URINE, RANDOM Performed at [facility], [address], [place], [place] [address]    Special Requests   Final    NONE Performed at [facility], [address], [place], [place] [address]    Culture   Final    NO  GROWTH Performed at [facility], [place] [address], [place], [place] [address]    Report Status 12/23/2019 FINAL  Final  Respiratory Panel by RT PCR (Flu A&B, Covid) - Nasopharyngeal Swab     Status: Abnormal   Collection Time: 12/22/19  8:49 AM   Specimen: Nasopharyngeal Swab  Result Value Ref Range Status   SARS Coronavirus 2 by RT PCR POSITIVE (A) NEGATIVE Final    Comment: RESULT CALLED TO, READ BACK BY AND VERIFIED WITH: KENDALL FLANNIGAN AT 7628 ON 12/22/2019 [place]. (NOTE) SARS-CoV-2 target nucleic acids are DETECTED.  SARS-CoV-2 RNA is generally detectable in upper respiratory specimens  during the acute phase of infection. Positive results are indicative of the presence of the identified virus, but do not rule out bacterial infection or co-infection with other pathogens not detected by the test. Clinical correlation with patient history and other diagnostic information is necessary to determine patient infection status. The expected result is Negative.  Fact Sheet for Patients:  PinkCheek.be  Fact Sheet for Healthcare Providers: GravelBags.it  This test is not yet approved or cleared by the [place] FDA and  has been authorized for detection and/or diagnosis of SARS-CoV-2 by FDA under an Emergency Use Authorization (EUA).  This EUA will remain in effect (meaning this te st can be used) for the duration of  the COVID-19 declaration under Section 564(b)(1) of the Act, 21 U.S.C. section 360bbb-3(b)(1), unless the authorization is terminated or revoked sooner.      Influenza A by PCR NEGATIVE NEGATIVE Final   Influenza B by PCR NEGATIVE NEGATIVE Final    Comment: (NOTE) The Xpert Xpress SARS-CoV-2/FLU/RSV assay is intended as an aid in  the diagnosis of influenza from Nasopharyngeal swab specimens and  should not be used as a sole basis  for treatment. Nasal washings and  aspirates are unacceptable for  Xpert Xpress SARS-CoV-2/FLU/RSV  testing.  Fact Sheet for Patients: PinkCheek.be  Fact Sheet for Healthcare Providers: GravelBags.it  This test is not yet approved or cleared by the [place] FDA and  has been authorized for detection and/or diagnosis of SARS-CoV-2 by  FDA under an Emergency Use Authorization (EUA). This EUA will remain  in effect (meaning this test can be used) for the duration of the  Covid-19 declaration under Section 564(b)(1) of the Act, 21  U.S.C. section 360bbb-3(b)(1), unless the authorization is  terminated or revoked. Performed at [facility], [address] [place], [place] [address]   MRSA PCR Screening     Status: None   Collection Time: 12/22/19  9:16 PM   Specimen: Nasal Mucosa; Nasopharyngeal  Result Value Ref Range Status   MRSA by PCR NEGATIVE NEGATIVE Final    Comment:        The GeneXpert MRSA Assay (FDA approved for NASAL specimens only), is one component of a comprehensive MRSA colonization surveillance program. It is not intended to diagnose MRSA infection nor to guide or monitor treatment for MRSA infections. Performed at [facility], [address], [place], [place] [address]          Radiology Studies: No results found.      Scheduled Meds: . allopurinol  50 mg Oral Daily  . amiodarone  200 mg Oral Daily  . amLODipine  10 mg Oral Daily  . apixaban  5 mg Oral BID  . atorvastatin  80 mg Oral QHS  . baricitinib  1 mg Oral Daily  . carvedilol  3.125 mg Oral BID WC  . Chlorhexidine Gluconate Cloth  6 each Topical Daily  . dexamethasone (DECADRON) injection  6 mg Intravenous Q24H  . insulin aspart  0-20 Units Subcutaneous TID AC & HS  . insulin aspart  3 Units Subcutaneous TID with meals  . levothyroxine  100 mcg Oral QAC breakfast  . sodium bicarbonate  1,300 mg Oral BID  . sodium zirconium cyclosilicate  10 g Oral Daily  . traZODone   50 mg Oral QHS   Continuous Infusions:   LOS: 9 days    Time spent: 26 minutes    Sharen Hones, MD [facility]   To contact the attending provider between 7A-7P or the covering provider during after hours 7P-7A, please log into the web site [email or web address] and access using universal [place] password for that web site. If you do not have the password, please call the hospital operator.  12/31/2019, 10:39 AM

## 2019-12-31 NOTE — Plan of Care (Signed)
  Problem: Education: Goal: Knowledge of risk factors and measures for prevention of condition will improve Outcome: Progressing   Problem: Coping: Goal: Psychosocial and spiritual needs will be supported Outcome: Progressing   Problem: Respiratory: Goal: Will maintain a patent airway Outcome: Progressing   

## 2019-12-31 NOTE — Progress Notes (Signed)
[facility]  ROUNDING NOTE   Subjective:     Denies any acute complaints  Oxygen 5-6 L by [place] Poor appetite   Objective:  Vital signs in last 24 hours:  Temp:  [97.7 F (36.5 C)-98.9 F (37.2 C)] 98.4 F (36.9 C) (11/07 0816) Pulse Rate:  [72-94] 79 (11/07 0816) Resp:  [18-26] 18 (11/07 0816) BP: (137-186)/(99-167) 162/99 (11/07 0816) SpO2:  [92 %-98 %] 97 % (11/07 0816) FiO2 (%):  [40 %] 40 % (11/06 1139) Weight:  [122.2 kg-124.4 kg] 122.2 kg (11/07 0516)  Weight change:  Filed Weights   12/24/19 0500 12/30/19 1416 12/31/19 0516  Weight: 123.6 kg 124.4 kg 122.2 kg    Intake/Output: I/O last 3 completed shifts: In: 620 [P.O.:620] Out: 2650 [Urine:2650]   Intake/Output this shift:  No intake/output data recorded.  Physical Exam: General: Ill appearing  Head: Normocephalic, atraumatic. Moist oral mucosal membranes  Eyes: Anicteric,   Lungs:  +coarse breath sounds,    Heart: Regular rate and rhythm  Abdomen:  Soft, nontender  Extremities: No peripheral edema.  Neurologic: Nonfocal, moving all four extremities  Skin: No lesions        Basic Metabolic Panel: Recent Labs  Lab 12/25/19 0529 12/25/19 0529 12/26/19 0711 12/26/19 0711 12/27/19 0458 12/27/19 0458 12/28/19 0802 12/28/19 0802 12/29/19 0633 12/30/19 0014 12/31/19 0415  NA 133*   < > 134*   < > 136  --  134*  --  132* 130* 133*  K 4.8   < > 4.9   < > 5.1  --  5.4*  --  5.6* 4.9 5.0  CL 103   < > 107   < > 108  --  104  --  102 98 101  CO2 20*   < > 18*   < > 18*  --  18*  --  18* 20* 22  GLUCOSE 136*   < > 112*   < > 127*  --  140*  --  129* 127* 116*  BUN 66*   < > 62*   < > 57*  --  59*  --  61* 72* 87*  CREATININE 2.49*   < > 2.25*   < > 2.18*  --  2.22*  --  2.44* 2.60* 3.02*  CALCIUM 7.8*   < > 8.1*   < > 8.7*   < > 8.8*   < > 8.9 8.7* 8.6*  MG 2.5*   < > 2.6*   < > 2.4  --  2.1  --  2.0 1.9 2.3  PHOS 3.8  --  3.6  --  3.8  --  4.3  --   --  5.3*  --    < > = values in  this interval not displayed.    Liver Function Tests: Recent Labs  Lab 12/25/19 0529 12/26/19 0711  ALBUMIN 2.6* 2.7*   No results for input(s): LIPASE, AMYLASE in the last 168 hours. No results for input(s): AMMONIA in the last 168 hours.  CBC: Recent Labs  Lab 12/26/19 0711 12/27/19 0458 12/28/19 0802 12/29/19 0633 12/31/19 0415  WBC 11.5* 13.4* 18.5* 18.8* 19.8*  NEUTROABS  --  9.9* 13.9* 14.2* 15.3*  HGB 13.7 13.8 15.2 15.5 15.2  HCT 40.5 41.7 46.7 47.4 45.8  MCV 86.2 85.5 87.1 87.1 85.9  PLT 335 377 349 405* 413*    Cardiac Enzymes: No results for input(s): CKTOTAL, CKMB, CKMBINDEX, TROPONINI in the last 168 hours.  BNP: Invalid  input(s): POCBNP  CBG: Recent Labs  Lab 12/30/19 0854 12/30/19 1154 12/30/19 1713 12/30/19 2045 12/31/19 0813  GLUCAP 140* 178* 202* 190* 116*    Microbiology: Results for orders placed or performed during the hospital encounter of 12/22/19  Blood Culture (routine x 2)     Status: None   Collection Time: 12/22/19  8:49 AM   Specimen: BLOOD  Result Value Ref Range Status   Specimen Description BLOOD RT [facility]  Final   Special Requests   Final    BOTTLES DRAWN AEROBIC AND ANAEROBIC Blood Culture adequate volume   Culture   Final    NO GROWTH 5 DAYS Performed at [facility], [address], [place], [place] [address]    Report Status 12/27/2019 FINAL  Final  Blood Culture (routine x 2)     Status: None   Collection Time: 12/22/19  8:49 AM   Specimen: BLOOD  Result Value Ref Range Status   Specimen Description BLOOD RT HAND  Final   Special Requests   Final    BOTTLES DRAWN AEROBIC AND ANAEROBIC Blood Culture adequate volume   Culture   Final    NO GROWTH 5 DAYS Performed at [facility], [address] [place], [place] [address]    Report Status 12/27/2019 FINAL  Final  Urine culture     Status: None   Collection Time: 12/22/19  8:49 AM   Specimen: Urine, Random  Result Value Ref Range Status    Specimen Description   Final    URINE, RANDOM Performed at [facility], [address], [place], [place] [address]    Special Requests   Final    NONE Performed at [facility], [address] [place], [place] [address]    Culture   Final    NO GROWTH Performed at [facility], [place] [address] [place], [place] [address]    Report Status 12/23/2019 FINAL  Final  Respiratory Panel by RT PCR (Flu A&B, Covid) - Nasopharyngeal Swab     Status: Abnormal   Collection Time: 12/22/19  8:49 AM   Specimen: Nasopharyngeal Swab  Result Value Ref Range Status   SARS Coronavirus 2 by RT PCR POSITIVE (A) NEGATIVE Final    Comment: RESULT CALLED TO, READ BACK BY AND VERIFIED WITH: KENDALL FLANNIGAN AT 7494 ON 12/22/2019 [place]. (NOTE) SARS-CoV-2 target nucleic acids are DETECTED.  SARS-CoV-2 RNA is generally detectable in upper respiratory specimens  during the acute phase of infection. Positive results are indicative of the presence of the identified virus, but do not rule out bacterial infection or co-infection with other pathogens not detected by the test. Clinical correlation with patient history and other diagnostic information is necessary to determine patient infection status. The expected result is Negative.  Fact Sheet for Patients:  PinkCheek.be  Fact Sheet for Healthcare Providers: GravelBags.it  This test is not yet approved or cleared by the [place] FDA and  has been authorized for detection and/or diagnosis of SARS-CoV-2 by FDA under an Emergency Use Authorization (EUA).  This EUA will remain in effect (meaning this te st can be used) for the duration of  the COVID-19 declaration under Section 564(b)(1) of the Act, 21 U.S.C. section 360bbb-3(b)(1), unless the authorization is terminated or revoked sooner.      Influenza A by PCR NEGATIVE NEGATIVE Final   Influenza B by PCR NEGATIVE  NEGATIVE Final    Comment: (NOTE) The Xpert Xpress SARS-CoV-2/FLU/RSV assay is intended as  an aid in  the diagnosis of influenza from Nasopharyngeal swab specimens and  should not be used as a sole basis for treatment. Nasal washings and  aspirates are unacceptable for Xpert Xpress SARS-CoV-2/FLU/RSV  testing.  Fact Sheet for Patients: PinkCheek.be  Fact Sheet for Healthcare Providers: GravelBags.it  This test is not yet approved or cleared by the [place] FDA and  has been authorized for detection and/or diagnosis of SARS-CoV-2 by  FDA under an Emergency Use Authorization (EUA). This EUA will remain  in effect (meaning this test can be used) for the duration of the  Covid-19 declaration under Section 564(b)(1) of the Act, 21  U.S.C. section 360bbb-3(b)(1), unless the authorization is  terminated or revoked. Performed at [facility], [address] [place], [place] [address]   MRSA PCR Screening     Status: None   Collection Time: 12/22/19  9:16 PM   Specimen: Nasal Mucosa; Nasopharyngeal  Result Value Ref Range Status   MRSA by PCR NEGATIVE NEGATIVE Final    Comment:        The GeneXpert MRSA Assay (FDA approved for NASAL specimens only), is one component of a comprehensive MRSA colonization surveillance program. It is not intended to diagnose MRSA infection nor to guide or monitor treatment for MRSA infections. Performed at [facility], [address] [place], [place] [address]     Coagulation Studies: No results for input(s): LABPROT, INR in the last 72 hours.  Urinalysis: No results for input(s): COLORURINE, LABSPEC, PHURINE, GLUCOSEU, HGBUR, BILIRUBINUR, KETONESUR, PROTEINUR, UROBILINOGEN, NITRITE, LEUKOCYTESUR in the last 72 hours.  Invalid input(s): APPERANCEUR    Imaging: No results found.   Medications:    . allopurinol  50 mg Oral Daily  . amiodarone  200 mg Oral  Daily  . amLODipine  10 mg Oral Daily  . apixaban  5 mg Oral BID  . atorvastatin  80 mg Oral QHS  . baricitinib  1 mg Oral Daily  . carvedilol  3.125 mg Oral BID WC  . Chlorhexidine Gluconate Cloth  6 each Topical Daily  . dexamethasone (DECADRON) injection  6 mg Intravenous Q24H  . insulin aspart  0-20 Units Subcutaneous TID AC & HS  . insulin aspart  3 Units Subcutaneous TID with meals  . levothyroxine  100 mcg Oral QAC breakfast  . sodium bicarbonate  1,300 mg Oral BID  . sodium zirconium cyclosilicate  10 g Oral Daily  . traZODone  50 mg Oral QHS   docusate sodium, guaiFENesin-dextromethorphan, hydrALAZINE, HYDROcodone-acetaminophen, polyethylene glycol  Assessment/ Plan:  [name] is a 54 y.o. black male with atrial fibrillation, hyperlipidemia, systolic congestive heart failure, alcohol abuse, hypothyroidism who was admitted to [facility] on 12/22/2019 for Hypoxia [R09.02] Pneumonia due to COVID-19 virus [U07.1, J12.82] COVID-19 [U07.1]   1. Acute kidney injury with hyperkalemia, on chronic kidney disease stage IIIB. Baseline creatinine of 2.37, GFR of 30 on 02/08/2019. Followed by [facility].    -We will hold further furosemide as patient appears euvolemic and oral intake appears to be low - holding entresto due to aki and hyperkalemia - Encourage PO intake.  - Continue sodium bicarbonate PO bid    2. Hypertension: with urgency on admission.   - Amlodipine started this admission.   - control improving slowly  3.  COVID-19 pneumonia Diagnosed 12/22/2019 Unvaccinated Currently getting treatment with IV dexamethasone, baricitinib Requiring high flow Oxygen  4. Hyperkalemia Low potassium diet.  - Continue Lokelma    LOS:  [place] 11/7/202111:10 AM

## 2020-01-01 DIAGNOSIS — J1282 Pneumonia due to coronavirus disease 2019: Secondary | ICD-10-CM | POA: Diagnosis not present

## 2020-01-01 DIAGNOSIS — N1832 Chronic kidney disease, stage 3b: Secondary | ICD-10-CM | POA: Diagnosis not present

## 2020-01-01 DIAGNOSIS — N170 Acute kidney failure with tubular necrosis: Secondary | ICD-10-CM | POA: Diagnosis not present

## 2020-01-01 DIAGNOSIS — U071 COVID-19: Secondary | ICD-10-CM | POA: Diagnosis not present

## 2020-01-01 LAB — GLUCOSE, CAPILLARY
Glucose-Capillary: 98 mg/dL (ref 70–99)
Glucose-Capillary: 183 mg/dL — ABNORMAL HIGH (ref 70–99)
Glucose-Capillary: 162 mg/dL — ABNORMAL HIGH (ref 70–99)
Glucose-Capillary: 143 mg/dL — ABNORMAL HIGH (ref 70–99)

## 2020-01-01 NOTE — Progress Notes (Signed)
Occupational Therapy Treatment Patient Details Name: Hector [name] MRN: [id number] DOB: 1965-12-27 Today's Date: 01/01/2020    History of present illness Pt is a 54 y/o M with PMH: CKD3, AFib, HLD, CHF, acohol abuse, and hypothyroidism who presented to ER secondary to progressive SOB, cough, weakness; admitted for management of acute respiratory failure with ARDS related to COVID-19.  Hospital course additionally significant for onset of L UE > LE numbness, coordination deficits; head CT negative, patient refusing MRI per chart.   OT comments  [name] was seen for OT treatment on this date. Upon arrival to room pt completing bath, requesting to use BSC over bed pan. MOD a sup>sit. MIN A don gown in sitting. Initial standing attempt unsuccessful. 2nd attempt pt achieved upright posture L rail + MAX A then said "no" and sat down; pt became unwilling to try further attempts despite success. Pt reports will try another day. CGA sit>sup. Pt making progress toward goals. Pt continues to benefit from skilled OT services to maximize return to PLOF and minimize risk of future falls, injury, caregiver burden, and readmission. Will continue to follow POC. Discharge recommendation remains appropriate.    Follow Up Recommendations  SNF    Equipment Recommendations  Other (comment) (defer to next venue )    Recommendations for Other Services      Precautions / Restrictions Precautions Precautions: Fall Restrictions Weight Bearing Restrictions: No       Mobility Bed Mobility Overal bed mobility: Needs Assistance Bed Mobility: Supine to Sit;Sit to Supine     Supine to sit: Mod assist Sit to supine: Min guard assist      Transfers Overall transfer level: Needs assistance Equipment used: 1 person hand held assist Transfers: Sit to/from Stand Sit to Stand: Max assist;From elevated surface      General transfer comment: Pt motivated to attempt BSC t/f - initial standing attempt  unsuccessful. 2nd attempt pt achieved upright posture then said "no" and sat down. unwilling to try furhter    Balance Overall balance assessment: Needs assistance Sitting-balance support: No upper extremity supported;Feet supported Sitting balance-Leahy Scale: Good     Standing balance support: Bilateral upper extremity supported Standing balance-Leahy Scale: Poor          ADL either performed or assessed with clinical judgement   ADL Overall ADL's : Needs assistance/impaired      General ADL Comments: MAX A for LBD at bed level. MIN A don gown seated EOB.                Cognition Arousal/Alertness: Awake/alert Behavior During Therapy: Flat affect Overall Cognitive Status: Within Functional Limits for tasks assessed        General Comments: Pt is self-limiting        Exercises Exercises: Other exercises Other Exercises Other Exercises: Pt educated re: falls prevention, ECS, importance of mobility for functional strengthening Other Exercises: LBD, toileitng, sup<>sit, sit<>stand, sitting/standing balacne/tolerance           Pertinent Vitals/ Pain       Pain Assessment: Faces Faces Pain Scale: Hurts little more Pain Location: RLE c light touch but no pain in WBing Pain Descriptors / Indicators: Grimacing Pain Intervention(s): Limited activity within patient's tolerance;Repositioned         Frequency  Min 1X/week        Progress Toward Goals  OT Goals(current goals can now be found in the care plan section)  Progress towards OT goals: Progressing toward goals  Acute Rehab  OT Goals Patient Stated Goal: to get my strength back OT Goal Formulation: With patient Time For Goal Achievement: 01/09/20 Potential to Achieve Goals: Fair ADL Goals Pt Will Perform Grooming: with supervision;sitting (to perform 2-3 g/h tasks to increase seated fxl activity tol) Pt Will Perform Lower Body Dressing: with mod assist;sit to/from stand (with AE PRN) Pt Will  Transfer to Toilet: with min assist;bedside commode (with LRAD ~5-10' to increase fxl mobility tolernace and ADL ) Pt Will Perform Toileting - Clothing Manipulation and hygiene: with min assist;sit to/from stand Pt/caregiver will Perform Home Exercise Program: Increased strength;Both right and left upper extremity;With minimal assist  Plan Discharge plan needs to be updated;Frequency remains appropriate    Co-evaluation                 AM-PAC OT "6 Clicks" Daily Activity     Outcome Measure   Help from another person eating meals?: None Help from another person taking care of personal grooming?: A Little Help from another person toileting, which includes using toliet, bedpan, or urinal?: A Lot Help from another person bathing (including washing, rinsing, drying)?: A Lot Help from another person to put on and taking off regular upper body clothing?: A Little Help from another person to put on and taking off regular lower body clothing?: A Lot 6 Click Score: 16    End of Session    OT Visit Diagnosis: Muscle weakness (generalized) (M62.81);Hemiplegia and hemiparesis Hemiplegia - Right/Left: Left Hemiplegia - caused by: Unspecified   Activity Tolerance Patient tolerated treatment well   Patient Left in bed;with call bell/phone within reach;with bed alarm set   Nurse Communication Other (comment) (pt left on bed pan)        Time: 4287-6811 OT Time Calculation (min): 24 min  Charges: OT General Charges $OT Visit: 1 Visit OT Treatments $Self Care/Home Management : 8-22 mins $Therapeutic Activity: 8-22 mins  Dessie Coma, M.S. OTR/L  01/01/20, 4:54 PM  ascom [phone]

## 2020-01-01 NOTE — Plan of Care (Signed)
  Problem: Clinical Measurements: Goal: Respiratory complications will improve Outcome: Progressing Goal: Cardiovascular complication will be avoided Outcome: Progressing   Pt refused all education Problem: Education: Goal: Knowledge of General Education information will improve Description: Including pain rating scale, medication(s)/side effects and non-pharmacologic comfort measures Outcome: Not Progressing   Problem: Health Behavior/Discharge Planning: Goal: Ability to manage health-related needs will improve Outcome: Not Progressing    Problem: Activity: Goal: Risk for activity intolerance will decrease Outcome: Not Progressing   Pt refused lab draw this am Problem: Clinical Measurements: Goal: Diagnostic test results will improve Outcome: Not Met (add Reason)

## 2020-01-01 NOTE — Progress Notes (Signed)
PROGRESS NOTE    KEBIN MAYE  MVH:[id number] DOB: 09-12-1965 DOA: 12/22/2019 PCP: [name], MD   Chief complaint pain shortness of breath. Brief Narrative:  Hector [name] is a 54 year old male with past medical history of congestive heart failure chronic kidney disease hyperlipidemia paroxysmal atrial fibrillation and alcohol use who presented to the emergency room today with 2 to 3 weeks history of shortness of breath associated with dry cough and generalized weakness. In the emergency room, his oxygen saturation was 70%, he was placed on 100% nonbreather and admitted to the ICU. He has been requiring heated high flow since being there. He was started on IV steroids, completed remdesivir. He was started on baricitinib, his CRP has normalized, baricitinib was discontinued.  I am assigned to this patient on 12/28/2019. Patient still high flow oxygen.Normal BMP procalcitonin level less than 0.01.  Due to worsening hypoxemia, baricitinib was restarted on 11/6.   11/8.  Patient condition finally improved, on 4 L oxygen.  Anticipating discharge in 2 or 3 days.     Assessment & Plan:   Principal Problem:   Pneumonia due to COVID-19 virus Active Problems:   Hypertensive heart disease   Hyperlipidemia   Chronic kidney disease (CKD), stage III (moderate) (HCC)   Paroxysmal atrial fibrillation (HCC)   Chronic diastolic heart failure (HCC)   Essential hypertension   Nonischemic cardiomyopathy (HCC)   Chronic systolic heart failure (HCC)   Acute respiratory failure with hypoxemia (HCC)   Severe sepsis ([place])   COVID-19   Morbidly obese (HCC)   Acute respiratory distress syndrome (ARDS) due to COVID-19 virus (HCC)   Acute renal failure superimposed on stage 3b chronic kidney disease (HCC)   Hyperkalemia   Hyponatremia  #1.  Acute hypoxemic respiratory failure, ARDS, COVID-19 pneumonia. Patient condition finally improved.  Currently on 4 L oxygen, he  does not have any evidence of volume overload or secondary bacterial pneumonia. Continue steroids and baricitinib.  Continue therapeutic Eliquis. Anticipating discharge in 2 or 3 days.  2.  Acute kidney injury on chronic kidney stage IIIb, metabolic acidosis, hyperkalemia and hyponatremia. Managed by nephrology, potassium is better, creatinine still trending up.  3.  Chronic combined systolic and diastolic congestive heart failure. Stable.  4.  Severe sepsis per Condition had improved.  5.  Paroxysmal atrial fibrillation. Continue anticoagulation with Eliquis.    DVT prophylaxis: Eliquis Code Status: Full Family Communication: None Disposition Plan:  .   Status is: Inpatient  Remains inpatient appropriate because:Inpatient level of care appropriate due to severity of illness   Dispo: The patient is from: Home              Anticipated d/c is to: Home              Anticipated d/c date is: 3 days              Patient currently is not medically stable to d/c.        I/O last 3 completed shifts: In: -  Out: 1200 [Urine:1200] No intake/output data recorded.     Consultants:   Nephrology  Procedures: None  Antimicrobials: None Subjective: Patient condition much improved today, currently on 4 L oxygen.  Short of breath improved.  He still has cough, with clear mucus. No fever or chills. Poor appetite without nausea vomiting.  No abdominal pain.  No diarrhea. Denies any dysuria or hematuria.  Objective: Vitals:   01/01/20 0448 01/01/20 0506 01/01/20 0807 01/01/20 1239  BP: (!) 148/105  (!) 156/108   Pulse: 77  74   Resp: 18     Temp: 99 F (37.2 C)  98.7 F (37.1 C)   TempSrc: Oral  Oral   SpO2: 96%  97% 100%  Weight:  122.2 kg    Height:       No intake or output data in the 24 hours ending 01/01/20 1328 Filed Weights   12/30/19 1416 12/31/19 0516 01/01/20 0506  Weight: 124.4 kg 122.2 kg 122.2 kg    Examination:  General exam: Appears calm and  comfortable  Respiratory system: Clear to auscultation. Respiratory effort normal. Cardiovascular system: S1 & S2 heard, RRR. No JVD, murmurs, rubs, gallops or clicks. No pedal edema. Gastrointestinal system: Abdomen is nondistended, soft and nontender. No organomegaly or masses felt. Normal bowel sounds heard. Central nervous system: Alert and oriented. No focal neurological deficits. Extremities: Symmetric  Skin: No rashes, lesions or ulcers Psychiatry:  Mood & affect appropriate.     Data Reviewed: I have personally reviewed following labs and imaging studies  CBC: Recent Labs  Lab 12/26/19 0711 12/27/19 0458 12/28/19 0802 12/29/19 0633 12/31/19 0415  WBC 11.5* 13.4* 18.5* 18.8* 19.8*  NEUTROABS  --  9.9* 13.9* 14.2* 15.3*  HGB 13.7 13.8 15.2 15.5 15.2  HCT 40.5 41.7 46.7 47.4 45.8  MCV 86.2 85.5 87.1 87.1 85.9  PLT 335 377 349 405* 175*   Basic Metabolic Panel: Recent Labs  Lab 12/26/19 0711 12/26/19 0711 12/27/19 0458 12/28/19 0802 12/29/19 0633 12/30/19 0014 12/31/19 0415  NA 134*   < > 136 134* 132* 130* 133*  K 4.9   < > 5.1 5.4* 5.6* 4.9 5.0  CL 107   < > 108 104 102 98 101  CO2 18*   < > 18* 18* 18* 20* 22  GLUCOSE 112*   < > 127* 140* 129* 127* 116*  BUN 62*   < > 57* 59* 61* 72* 87*  CREATININE 2.25*   < > 2.18* 2.22* 2.44* 2.60* 3.02*  CALCIUM 8.1*   < > 8.7* 8.8* 8.9 8.7* 8.6*  MG 2.6*   < > 2.4 2.1 2.0 1.9 2.3  PHOS 3.6  --  3.8 4.3  --  5.3*  --    < > = values in this interval not displayed.   GFR: Estimated Creatinine Clearance: 36.8 mL/min (A) (by C-G formula based on SCr of 3.02 mg/dL (H)). Liver Function Tests: Recent Labs  Lab 12/26/19 0711  ALBUMIN 2.7*   No results for input(s): LIPASE, AMYLASE in the last 168 hours. No results for input(s): AMMONIA in the last 168 hours. Coagulation Profile: No results for input(s): INR, PROTIME in the last 168 hours. Cardiac Enzymes: No results for input(s): CKTOTAL, CKMB, CKMBINDEX, TROPONINI  in the last 168 hours. BNP (last 3 results) No results for input(s): PROBNP in the last 8760 hours. HbA1C: No results for input(s): HGBA1C in the last 72 hours. CBG: Recent Labs  Lab 12/31/19 1204 12/31/19 1714 12/31/19 2050 01/01/20 0806 01/01/20 1233  GLUCAP 127* 157* 183* 98 183*   Lipid Profile: No results for input(s): CHOL, HDL, LDLCALC, TRIG, CHOLHDL, LDLDIRECT in the last 72 hours. Thyroid Function Tests: No results for input(s): TSH, T4TOTAL, FREET4, T3FREE, THYROIDAB in the last 72 hours. Anemia Panel: Recent Labs    12/30/19 0014 12/31/19 0415  FERRITIN 807* 696*   Sepsis Labs: Recent Labs  Lab 12/29/19 0633  PROCALCITON <0.10    Recent Results (from  the past 240 hour(s))  MRSA PCR Screening     Status: None   Collection Time: 12/22/19  9:16 PM   Specimen: Nasal Mucosa; Nasopharyngeal  Result Value Ref Range Status   MRSA by PCR NEGATIVE NEGATIVE Final    Comment:        The GeneXpert MRSA Assay (FDA approved for NASAL specimens only), is one component of a comprehensive MRSA colonization surveillance program. It is not intended to diagnose MRSA infection nor to guide or monitor treatment for MRSA infections. Performed at [facility], [address], [place], [place] [address]          Radiology Studies: No results found.      Scheduled Meds: . allopurinol  50 mg Oral Daily  . amiodarone  200 mg Oral Daily  . amLODipine  10 mg Oral Daily  . apixaban  5 mg Oral BID  . atorvastatin  80 mg Oral QHS  . baricitinib  1 mg Oral Daily  . carvedilol  3.125 mg Oral BID WC  . Chlorhexidine Gluconate Cloth  6 each Topical Daily  . insulin aspart  0-20 Units Subcutaneous TID AC & HS  . insulin aspart  3 Units Subcutaneous TID with meals  . levothyroxine  100 mcg Oral QAC breakfast  . sodium bicarbonate  1,300 mg Oral BID  . sodium zirconium cyclosilicate  10 g Oral Daily  . traZODone  50 mg Oral QHS   Continuous Infusions:    LOS: 10 days    Time spent: 28 minutes    Sharen Hones, MD [facility]   To contact the attending provider between 7A-7P or the covering provider during after hours 7P-7A, please log into the web site [email or web address] and access using universal [place] password for that web site. If you do not have the password, please call the hospital operator.  01/01/2020, 1:28 PM

## 2020-01-01 NOTE — Plan of Care (Signed)
  Problem: Education: Goal: Knowledge of General Education information will improve Description: Including pain rating scale, medication(s)/side effects and non-pharmacologic comfort measures Outcome: Progressing   Problem: Health Behavior/Discharge Planning: Goal: Ability to manage health-related needs will improve Outcome: Progressing   Problem: Clinical Measurements: Goal: Ability to maintain clinical measurements within normal limits will improve Outcome: Progressing Goal: Will remain free from infection Outcome: Progressing Goal: Diagnostic test results will improve Outcome: Progressing Goal: Respiratory complications will improve Outcome: Progressing Goal: Cardiovascular complication will be avoided Outcome: Progressing   Problem: Activity: Goal: Risk for activity intolerance will decrease Outcome: Progressing   Problem: Nutrition: Goal: Adequate nutrition will be maintained Outcome: Progressing   Problem: Coping: Goal: Level of anxiety will decrease Outcome: Progressing   Problem: Elimination: Goal: Will not experience complications related to bowel motility Outcome: Progressing Goal: Will not experience complications related to urinary retention Outcome: Progressing   Problem: Pain Managment: Goal: General experience of comfort will improve Outcome: Progressing   Problem: Safety: Goal: Ability to remain free from injury will improve Outcome: Progressing   Problem: Skin Integrity: Goal: Risk for impaired skin integrity will decrease Outcome: Progressing   Problem: Education: Goal: Knowledge of risk factors and measures for prevention of condition will improve Outcome: Progressing   Problem: Coping: Goal: Psychosocial and spiritual needs will be supported Outcome: Progressing   Problem: Respiratory: Goal: Will maintain a patent airway Outcome: Progressing

## 2020-01-02 DIAGNOSIS — U071 COVID-19: Secondary | ICD-10-CM | POA: Diagnosis not present

## 2020-01-02 DIAGNOSIS — J1282 Pneumonia due to coronavirus disease 2019: Secondary | ICD-10-CM | POA: Diagnosis not present

## 2020-01-02 DIAGNOSIS — N170 Acute kidney failure with tubular necrosis: Secondary | ICD-10-CM | POA: Diagnosis not present

## 2020-01-02 DIAGNOSIS — N1832 Chronic kidney disease, stage 3b: Secondary | ICD-10-CM | POA: Diagnosis not present

## 2020-01-02 LAB — CBC WITH DIFFERENTIAL/PLATELET
Abs Immature Granulocytes: 0.32 10*3/uL — ABNORMAL HIGH (ref 0.00–0.07)
Basophils Absolute: 0 10*3/uL (ref 0.0–0.1)
Basophils Relative: 0 %
Eosinophils Absolute: 0 10*3/uL (ref 0.0–0.5)
Eosinophils Relative: 0 %
HCT: 47.5 % (ref 39.0–52.0)
Hemoglobin: 16.1 g/dL (ref 13.0–17.0)
Immature Granulocytes: 1 %
Lymphocytes Relative: 8 %
Lymphs Abs: 1.9 10*3/uL (ref 0.7–4.0)
MCH: 29.1 pg (ref 26.0–34.0)
MCHC: 33.9 g/dL (ref 30.0–36.0)
MCV: 85.9 fL (ref 80.0–100.0)
Monocytes Absolute: 1.7 10*3/uL — ABNORMAL HIGH (ref 0.1–1.0)
Monocytes Relative: 7 %
Neutro Abs: 19 10*3/uL — ABNORMAL HIGH (ref 1.7–7.7)
Neutrophils Relative %: 84 %
Platelets: 370 10*3/uL (ref 150–400)
RBC: 5.53 MIL/uL (ref 4.22–5.81)
RDW: 14 % (ref 11.5–15.5)
WBC: 23 10*3/uL — ABNORMAL HIGH (ref 4.0–10.5)
nRBC: 0 % (ref 0.0–0.2)

## 2020-01-02 LAB — GLUCOSE, CAPILLARY
Glucose-Capillary: 90 mg/dL (ref 70–99)
Glucose-Capillary: 118 mg/dL — ABNORMAL HIGH (ref 70–99)
Glucose-Capillary: 133 mg/dL — ABNORMAL HIGH (ref 70–99)
Glucose-Capillary: 105 mg/dL — ABNORMAL HIGH (ref 70–99)

## 2020-01-02 LAB — BASIC METABOLIC PANEL
Anion gap: 11 (ref 5–15)
BUN: 87 mg/dL — ABNORMAL HIGH (ref 6–20)
CO2: 24 mmol/L (ref 22–32)
Calcium: 8.7 mg/dL — ABNORMAL LOW (ref 8.9–10.3)
Chloride: 99 mmol/L (ref 98–111)
Creatinine, Ser: 2.58 mg/dL — ABNORMAL HIGH (ref 0.61–1.24)
GFR, Estimated: 29 mL/min — ABNORMAL LOW (ref >60)
Glucose, Bld: 94 mg/dL (ref 70–99)
Potassium: 5.1 mmol/L (ref 3.5–5.1)
Sodium: 134 mmol/L — ABNORMAL LOW (ref 135–145)

## 2020-01-02 LAB — FERRITIN: Ferritin: 1115 ng/mL — ABNORMAL HIGH (ref 24–336)

## 2020-01-02 LAB — MAGNESIUM: Magnesium: 2.4 mg/dL (ref 1.7–2.4)

## 2020-01-02 LAB — FIBRIN DERIVATIVES D-DIMER (ARMC ONLY): Fibrin derivatives D-dimer (ARMC): 933.86 ng/mL (FEU) — ABNORMAL HIGH (ref 0.00–499.00)

## 2020-01-02 LAB — C-REACTIVE PROTEIN: CRP: 0.5 mg/dL (ref <1.0)

## 2020-01-02 MED ORDER — NEPRO/CARBSTEADY PO LIQD
237.0000 mL | Freq: Two times a day (BID) | ORAL | Status: DC
  Administered 2020-01-03 – 2020-01-16 (×23): 237 mL via ORAL

## 2020-01-02 NOTE — Progress Notes (Signed)
PROGRESS NOTE    Hector [name]  [id number]:[id number] DOB: 1965-03-19 DOA: 12/22/2019 PCP: [name], MD   Chief complaint.  Shortness of breath. Brief Narrative:  [name] is a 54 year old male with past medical history of congestive heart failure chronic kidney disease hyperlipidemia paroxysmal atrial fibrillation and alcohol use who presented to the emergency room today with 2 to 3 weeks history of shortness of breath associated with dry cough and generalized weakness. In the emergency room, his oxygen saturation was 70%, he was placed on 100% nonbreather and admitted to the ICU. He has been requiring heated high flow since being there. He was started on IV steroids, completed remdesivir. He was started on baricitinib, his CRP has normalized, baricitinib was discontinued.  II was assigned to this patient on 12/28/2019. Patient still using high flow oxygen.Normal BNP, procalcitonin level less than 0.01.  Due to worsening hypoxemia, baricitinib was restarted on 11/6.   11/8.  Patient condition finally improved, on 4 L oxygen.  Anticipating discharge in 2 or 3 days.   Assessment & Plan:   Principal Problem:   Pneumonia due to COVID-19 virus Active Problems:   Hypertensive heart disease   Hyperlipidemia   Chronic kidney disease (CKD), stage III (moderate) (HCC)   Paroxysmal atrial fibrillation (HCC)   Chronic diastolic heart failure (HCC)   Essential hypertension   Nonischemic cardiomyopathy (HCC)   Chronic systolic heart failure (HCC)   Acute respiratory failure with hypoxemia (HCC)   Severe sepsis ([place])   COVID-19   Morbidly obese (HCC)   Acute respiratory distress syndrome (ARDS) due to COVID-19 virus (HCC)   Acute renal failure superimposed on stage 3b chronic kidney disease (HCC)   Hyperkalemia   Hyponatremia  #1.  Acute hypoxemic respiratory failure, ARDS, COVID-19 pneumonia. Patient condition gradually improving, currently on 2-1/2 L  oxygen.  I will continue baricitinib. Patient also on therapeutic Eliquis. He has completed steroids and remdesivir. Anticipating discharge in 1 to 2 days.  #2 Acute kidney injury on chronic kidney disease stage IIIb, metabolic acidosis, hyperkalemia and hyponatremia. Condition finally improving today.  He has been followed by nephrology.  3.  Chronic combined systolic and diastolic congestive heart failure. Stable clinically  4.  Severe sepsis. Resolved  5.  Paroxysmal atrial fibrillation. Continue Eliquis.    DVT prophylaxis: Eliquis Code Status: Full Family Communication: None Disposition Plan:  .   Status is: Inpatient  Remains inpatient appropriate because:Inpatient level of care appropriate due to severity of illness   Dispo: The patient is from: Home              Anticipated d/c is to: Home              Anticipated d/c date is: 1 day              Patient currently is not medically stable to d/c.        I/O last 3 completed shifts: In: 240 [P.O.:240] Out: 2350 [Urine:2350] No intake/output data recorded.     Consultants:   None  Procedures: None  Antimicrobials: None  Subjective: Patient condition finally improved.  He still has some short of breath with exertion.  But he was able to get to the bedside commode. He had a normal bowel movement yesterday, no nausea vomiting abdominal pain. No dysuria hematuria No fever chills.  Objective: Vitals:   01/02/20 0433 01/02/20 0434 01/02/20 0530 01/02/20 0807  BP:  (!) 173/120 (!) 141/105 (!) 139/103  Pulse:  71  75  Resp:  19  18  Temp:  98.6 F (37 C)  98.6 F (37 C)  TempSrc:  Oral  Oral  SpO2:  96%  96%  Weight: 120.2 kg     Height:        Intake/Output Summary (Last 24 hours) at 01/02/2020 1057 Last data filed at 01/02/2020 0434 Gross per 24 hour  Intake 240 ml  Output 2350 ml  Net -2110 ml   Filed Weights   12/31/19 0516 01/01/20 0506 01/02/20 0433  Weight: 122.2 kg 122.2 kg 120.2  kg    Examination:  General exam: Appears calm and comfortable  Respiratory system: Clear to auscultation. Respiratory effort normal. Cardiovascular system: S1 & S2 heard, RRR. No JVD, murmurs, rubs, gallops or clicks. No pedal edema. Gastrointestinal system: Abdomen is nondistended, soft and nontender. No organomegaly or masses felt. Normal bowel sounds heard. Central nervous system: Alert and oriented. No focal neurological deficits. Extremities: Symmetric  Skin: No rashes, lesions or ulcers Psychiatry:  Mood & affect appropriate.     Data Reviewed: I have personally reviewed following labs and imaging studies  CBC: Recent Labs  Lab 12/27/19 0458 12/28/19 0802 12/29/19 0633 12/31/19 0415 01/02/20 0413  WBC 13.4* 18.5* 18.8* 19.8* 23.0*  NEUTROABS 9.9* 13.9* 14.2* 15.3* 19.0*  HGB 13.8 15.2 15.5 15.2 16.1  HCT 41.7 46.7 47.4 45.8 47.5  MCV 85.5 87.1 87.1 85.9 85.9  PLT 377 349 405* 413* 287   Basic Metabolic Panel: Recent Labs  Lab 12/27/19 0458 12/27/19 0458 12/28/19 0802 12/29/19 0633 12/30/19 0014 12/31/19 0415 01/02/20 0413  NA 136   < > 134* 132* 130* 133* 134*  K 5.1   < > 5.4* 5.6* 4.9 5.0 5.1  CL 108   < > 104 102 98 101 99  CO2 18*   < > 18* 18* 20* 22 24  GLUCOSE 127*   < > 140* 129* 127* 116* 94  BUN 57*   < > 59* 61* 72* 87* 87*  CREATININE 2.18*   < > 2.22* 2.44* 2.60* 3.02* 2.58*  CALCIUM 8.7*   < > 8.8* 8.9 8.7* 8.6* 8.7*  MG 2.4   < > 2.1 2.0 1.9 2.3 2.4  PHOS 3.8  --  4.3  --  5.3*  --   --    < > = values in this interval not displayed.   GFR: Estimated Creatinine Clearance: 42.7 mL/min (A) (by C-G formula based on SCr of 2.58 mg/dL (H)). Liver Function Tests: No results for input(s): AST, ALT, ALKPHOS, BILITOT, PROT, ALBUMIN in the last 168 hours. No results for input(s): LIPASE, AMYLASE in the last 168 hours. No results for input(s): AMMONIA in the last 168 hours. Coagulation Profile: No results for input(s): INR, PROTIME in the last  168 hours. Cardiac Enzymes: No results for input(s): CKTOTAL, CKMB, CKMBINDEX, TROPONINI in the last 168 hours. BNP (last 3 results) No results for input(s): PROBNP in the last 8760 hours. HbA1C: No results for input(s): HGBA1C in the last 72 hours. CBG: Recent Labs  Lab 01/01/20 0806 01/01/20 1233 01/01/20 1529 01/01/20 2028 01/02/20 0810  GLUCAP 98 183* 162* 143* 90   Lipid Profile: No results for input(s): CHOL, HDL, LDLCALC, TRIG, CHOLHDL, LDLDIRECT in the last 72 hours. Thyroid Function Tests: No results for input(s): TSH, T4TOTAL, FREET4, T3FREE, THYROIDAB in the last 72 hours. Anemia Panel: Recent Labs    12/31/19 0415 01/02/20 0413  FERRITIN 696* 1,115*   Sepsis Labs:  Recent Labs  Lab 12/29/19 0633  PROCALCITON <0.10    No results found for this or any previous visit (from the past 240 hour(s)).       Radiology Studies: No results found.      Scheduled Meds: . allopurinol  50 mg Oral Daily  . amiodarone  200 mg Oral Daily  . amLODipine  10 mg Oral Daily  . apixaban  5 mg Oral BID  . atorvastatin  80 mg Oral QHS  . baricitinib  1 mg Oral Daily  . carvedilol  3.125 mg Oral BID WC  . Chlorhexidine Gluconate Cloth  6 each Topical Daily  . insulin aspart  0-20 Units Subcutaneous TID AC & HS  . insulin aspart  3 Units Subcutaneous TID with meals  . levothyroxine  100 mcg Oral QAC breakfast  . sodium bicarbonate  1,300 mg Oral BID  . sodium zirconium cyclosilicate  10 g Oral Daily  . traZODone  50 mg Oral QHS   Continuous Infusions:   LOS: 11 days    Time spent: 28 minutes    Sharen Hones, MD [facility]   To contact the attending provider between 7A-7P or the covering provider during after hours 7P-7A, please log into the web site [email or web address] and access using universal [place] password for that web site. If you do not have the password, please call the hospital operator.  01/02/2020, 10:57 AM

## 2020-01-02 NOTE — Plan of Care (Signed)

## 2020-01-02 NOTE — Progress Notes (Signed)
Initial Nutrition Assessment  DOCUMENTATION CODES:   Obesity unspecified  INTERVENTION:   Nepro Shake po BID, each supplement provides 425 kcal and 19 grams protein  Recommend MVI, folic acid and thiamine daily in setting of etoh abuse.   NUTRITION DIAGNOSIS:   Increased nutrient needs related to catabolic illness (COVID 19) as evidenced by increased estimated needs  GOAL:   Patient will meet greater than or equal to 90% of their needs  MONITOR:   PO intake, Supplement acceptance, Labs, Weight trends, Skin, I & O's  REASON FOR ASSESSMENT:   LOS    ASSESSMENT:   54 y.o. black male with atrial fibrillation, hyperlipidemia, systolic congestive heart failure, alcohol abuse, hypothyroidism who was admitted to [facility] on 12/22/2019 for pneumonia due to COVID-19 virus   Unable to reach pt by phone. Per chart review, pt eating 20-60% of meals during the first part of his admit but is noted to be eating 100% of meals over the past three days. Pt on renal diet in setting of hyperkalemia. Per chart, pt down 12lbs(5%) over the past two months pta. Pt is down 8lbs(3%) since admit; this is significant. RD will add supplements to help pt meet his estimated needs. RD will obtain nutrition related history at follow-up.   Medications reviewed and include: allopurinol, insulin, synthroid, Na bicarbonate, lokelma  Labs reviewed: Na 134(L), BUN 87(H), creat 2.58(H) Wbc- 23.0(H) cbgs- 90, 118 x 24 hrs AIC 7.7(H)- 10/30  NUTRITION - FOCUSED PHYSICAL EXAM: Unable to perform at this time   Diet Order:   Diet Order            Diet renal with fluid restriction Fluid restriction: Other (see comments); Room service appropriate? Yes; Fluid consistency: Thin  Diet effective now                EDUCATION NEEDS:   Not appropriate for education at this time  Skin:  Skin Assessment: Reviewed RN Assessment  Last BM:  11/9- type 6  Height:   Ht Readings from Last 1 Encounters:  12/30/19 5'  11" (1.803 m)    Weight:   Wt Readings from Last 1 Encounters:  01/02/20 120.2 kg    Ideal Body Weight:  78 kg  BMI:  Body mass index is 36.95 kg/m.  Estimated Nutritional Needs:   Kcal:  2700-3000kcal/day  Protein:  >135g/day  Fluid:  2.4-2.7L/day  [name] MS, RD, LDN Please refer to [facility] for RD and/or RD on-call/weekend/after hours pager

## 2020-01-02 NOTE — Plan of Care (Signed)
  Problem: Education: Goal: Knowledge of General Education information will improve Description: Including pain rating scale, medication(s)/side effects and non-pharmacologic comfort measures Outcome: Progressing   Problem: Health Behavior/Discharge Planning: Goal: Ability to manage health-related needs will improve Outcome: Progressing   Problem: Clinical Measurements: Goal: Ability to maintain clinical measurements within normal limits will improve Outcome: Progressing Goal: Will remain free from infection Outcome: Progressing Goal: Diagnostic test results will improve Outcome: Progressing Goal: Respiratory complications will improve Outcome: Progressing Goal: Cardiovascular complication will be avoided Outcome: Progressing   Problem: Activity: Goal: Risk for activity intolerance will decrease Outcome: Progressing   Problem: Nutrition: Goal: Adequate nutrition will be maintained Outcome: Progressing   Problem: Coping: Goal: Level of anxiety will decrease Outcome: Progressing   Problem: Elimination: Goal: Will not experience complications related to bowel motility Outcome: Progressing Goal: Will not experience complications related to urinary retention Outcome: Progressing   Problem: Pain Managment: Goal: General experience of comfort will improve Outcome: Progressing   Problem: Safety: Goal: Ability to remain free from injury will improve Outcome: Progressing   Problem: Skin Integrity: Goal: Risk for impaired skin integrity will decrease Outcome: Progressing   Problem: Education: Goal: Knowledge of risk factors and measures for prevention of condition will improve Outcome: Progressing   Problem: Coping: Goal: Psychosocial and spiritual needs will be supported Outcome: Progressing   Problem: Respiratory: Goal: Will maintain a patent airway Outcome: Progressing   Problem: Education: Goal: Knowledge of disease and its progression will improve Outcome:  Progressing Goal: Individualized Educational Video(s) Outcome: Progressing   Problem: Fluid Volume: Goal: Compliance with measures to maintain balanced fluid volume will improve Outcome: Progressing   Problem: Health Behavior/Discharge Planning: Goal: Ability to manage health-related needs will improve Outcome: Progressing   Problem: Nutritional: Goal: Ability to make healthy dietary choices will improve Outcome: Progressing   Problem: Clinical Measurements: Goal: Complications related to the disease process, condition or treatment will be avoided or minimized Outcome: Progressing

## 2020-01-02 NOTE — TOC Initial Note (Addendum)
Transition of Care [facility]) - Initial/Assessment Note    Patient Details  Name: Hector [name] MRN: [id number] Date of Birth: November 21, 1965  Transition of Care [facility]) CM/SW Contact:    [name], LCSW Phone Number: 01/02/2020, 10:10 AM  Clinical Narrative:    Pt lives with mother. Pt is refusing SNF. Pt states he would rather get therapy in home. Pt states he gets rides to his appointments. Pt states he has a established PCP. Pt states he uses [facility] to get his medications. CSW will search for [facility] agency to service pt given his insurance. Pt states he has a walker at home and does not need any additional equipment.      [facility] can not accept pt at this time. CSW will continue to search for a agency to service pt.           Expected Discharge Plan: [place] Barriers to Discharge: Continued Medical Work up   Patient Goals and CMS Choice Patient states their goals for this hospitalization and ongoing recovery are:: to go home   Choice offered to / list presented to : Patient  Expected Discharge Plan and Services Expected Discharge Plan: [place] In-house Referral: NA   Post Acute Care Choice: [place] arrangements for the past 2 months: Single Family Home                                      Prior Living Arrangements/Services Living arrangements for the past 2 months: Single Family Home Lives with:: Parents Patient language and need for interpreter reviewed:: Yes Do you feel safe going back to the place where you live?: Yes      Need for Family Participation in Patient Care: Yes (Comment) Care giver support system in place?: Yes (comment)   Criminal Activity/Legal Involvement Pertinent to Current Situation/Hospitalization: No - Comment as needed  Activities of Daily Living Home Assistive Devices/Equipment: None ADL Screening (condition at time of admission) Patient's  cognitive ability adequate to safely complete daily activities?: Yes Is the patient deaf or have difficulty hearing?: No Does the patient have difficulty seeing, even when wearing glasses/contacts?: No Does the patient have difficulty concentrating, remembering, or making decisions?: No Patient able to express need for assistance with ADLs?: Yes Does the patient have difficulty dressing or bathing?: No Independently performs ADLs?: Yes (appropriate for developmental age) Does the patient have difficulty walking or climbing stairs?: Yes Weakness of Legs: Both Weakness of Arms/Hands: Both  Permission Sought/Granted Permission sought to share information with : Family Supports    Share Information with NAME: [name]     Permission granted to share info w Relationship: mother     Emotional Assessment Appearance:: Appears stated age Attitude/Demeanor/Rapport: Unable to Assess Affect (typically observed): Unable to Assess Orientation: : Oriented to Self, Oriented to Place, Oriented to  Time, Oriented to Situation   Psych Involvement: No (comment)  Admission diagnosis:  Hypoxia [R09.02] Pneumonia due to COVID-19 virus [U07.1, J12.82] COVID-19 [U07.1] Patient Active Problem List   Diagnosis Date Noted  . Acute respiratory distress syndrome (ARDS) due to COVID-19 virus ([place]) 12/31/2019  . Acute renal failure superimposed on stage 3b chronic kidney disease ([place]) 12/31/2019  . Hyperkalemia 12/31/2019  . Hyponatremia 12/31/2019  . Morbidly obese ([place]) 12/29/2019  . Pneumonia due to COVID-19 virus 12/22/2019  Class: Acute  . Acute respiratory failure with hypoxemia ([place]) 12/22/2019    Class: Acute  . Severe sepsis ([place]) 12/22/2019    Class: Acute  . COVID-19 12/22/2019  . Chronic systolic heart failure ([place]) 07/21/2016  . Obstructive sleep apnea 04/21/2016  . Nonischemic cardiomyopathy ([place]) 05/26/2015  . Chronic diastolic heart failure ([place]) 04/01/2015  . Essential hypertension  04/01/2015  . Sinus bradycardia 01/29/2015  . Paroxysmal atrial fibrillation ([place]) 08/16/2014  . Paroxysmal atrial flutter ([place]) 08/14/2014    Class: Acute  . Chronic kidney disease (CKD), stage III (moderate) ([place]) 07/02/2014  . Hypertensive heart disease   . Hyperlipidemia    PCP:  [name], MD Pharmacy:   [facility], [address] [place] [id number] Phone: [phone] Fax: [phone]     Social Determinants of Health (SDOH) Interventions    Readmission Risk Interventions Readmission Risk Prevention Plan 01/02/2020  Transportation Screening Complete  PCP or Specialist Appt within 3-5 Days Complete  HRI or [place] Complete  Social Work Consult for [place] Planning/Counseling Complete  Palliative Care Screening Not Applicable  Medication Review Press photographer) Complete  Some recent data might be hidden

## 2020-01-02 NOTE — Progress Notes (Signed)
PT Cancellation Note  Patient Details Name: Hector [name] MRN: [id number] DOB: [birth year]   Cancelled Treatment:     PT attempt. Pt refused max encouragement for OOB activity however pt is unwilling. He states," I'll try at this time tomorrow." Author will return at 1 pm next date per pt request.   Willette Pa 01/02/2020, 1:10 PM

## 2020-01-03 DIAGNOSIS — J1282 Pneumonia due to coronavirus disease 2019: Secondary | ICD-10-CM | POA: Diagnosis not present

## 2020-01-03 DIAGNOSIS — U071 COVID-19: Secondary | ICD-10-CM | POA: Diagnosis not present

## 2020-01-03 LAB — GLUCOSE, CAPILLARY
Glucose-Capillary: 81 mg/dL (ref 70–99)
Glucose-Capillary: 109 mg/dL — ABNORMAL HIGH (ref 70–99)
Glucose-Capillary: 94 mg/dL (ref 70–99)
Glucose-Capillary: 165 mg/dL — ABNORMAL HIGH (ref 70–99)

## 2020-01-03 LAB — CBC WITH DIFFERENTIAL/PLATELET
Abs Immature Granulocytes: 0.19 10*3/uL — ABNORMAL HIGH (ref 0.00–0.07)
Basophils Absolute: 0 10*3/uL (ref 0.0–0.1)
Basophils Relative: 0 %
Eosinophils Absolute: 0.2 10*3/uL (ref 0.0–0.5)
Eosinophils Relative: 1 %
HCT: 47.4 % (ref 39.0–52.0)
Hemoglobin: 15.9 g/dL (ref 13.0–17.0)
Immature Granulocytes: 1 %
Lymphocytes Relative: 9 %
Lymphs Abs: 1.6 10*3/uL (ref 0.7–4.0)
MCH: 29.2 pg (ref 26.0–34.0)
MCHC: 33.5 g/dL (ref 30.0–36.0)
MCV: 87 fL (ref 80.0–100.0)
Monocytes Absolute: 1.4 10*3/uL — ABNORMAL HIGH (ref 0.1–1.0)
Monocytes Relative: 7 %
Neutro Abs: 16 10*3/uL — ABNORMAL HIGH (ref 1.7–7.7)
Neutrophils Relative %: 82 %
Platelets: 280 10*3/uL (ref 150–400)
RBC: 5.45 MIL/uL (ref 4.22–5.81)
RDW: 14.4 % (ref 11.5–15.5)
WBC: 19.4 10*3/uL — ABNORMAL HIGH (ref 4.0–10.5)
nRBC: 0 % (ref 0.0–0.2)

## 2020-01-03 LAB — MAGNESIUM: Magnesium: 2.2 mg/dL (ref 1.7–2.4)

## 2020-01-03 LAB — BASIC METABOLIC PANEL
Anion gap: 10 (ref 5–15)
BUN: 80 mg/dL — ABNORMAL HIGH (ref 6–20)
CO2: 23 mmol/L (ref 22–32)
Calcium: 8.5 mg/dL — ABNORMAL LOW (ref 8.9–10.3)
Chloride: 98 mmol/L (ref 98–111)
Creatinine, Ser: 2.33 mg/dL — ABNORMAL HIGH (ref 0.61–1.24)
GFR, Estimated: 32 mL/min — ABNORMAL LOW (ref >60)
Glucose, Bld: 92 mg/dL (ref 70–99)
Potassium: 5 mmol/L (ref 3.5–5.1)
Sodium: 131 mmol/L — ABNORMAL LOW (ref 135–145)

## 2020-01-03 LAB — FIBRIN DERIVATIVES D-DIMER (ARMC ONLY): Fibrin derivatives D-dimer (ARMC): 1017.94 ng/mL (FEU) — ABNORMAL HIGH (ref 0.00–499.00)

## 2020-01-03 MED ORDER — APIXABAN 5 MG PO TABS
5.0000 mg | ORAL_TABLET | Freq: Two times a day (BID) | ORAL | Status: DC
  Administered 2020-01-03 – 2020-01-19 (×32): 5 mg via ORAL
  Filled 2020-01-03 (×34): qty 1

## 2020-01-03 MED ORDER — ENTRESTO 97-103 MG PO TABS: ORAL_TABLET | ORAL | 5 refills | Status: DC

## 2020-01-03 MED ORDER — SODIUM ZIRCONIUM CYCLOSILICATE 10 G PO PACK: 10.0000 g | PACK | Freq: Every day | ORAL | 0 refills | Status: DC

## 2020-01-03 MED ORDER — BARICITINIB 2 MG PO TABS
2.0000 mg | ORAL_TABLET | Freq: Every day | ORAL | Status: AC
  Administered 2020-01-03 – 2020-01-08 (×6): 2 mg via ORAL
  Filled 2020-01-03 (×6): qty 1

## 2020-01-03 MED ORDER — FUROSEMIDE 40 MG PO TABS: 40.0000 mg | ORAL_TABLET | Freq: Every day | ORAL | 3 refills | Status: DC | PRN

## 2020-01-03 NOTE — NC FL2 (Signed)
[place] LEVEL OF CARE SCREENING TOOL     IDENTIFICATION  Patient Name: Hector [name] Birthdate: [birth year] Sex: male Admission Date (Current Location): 12/22/2019  [place] and [place] Number:  Engineering geologist and Address:  [facility], [address] [place], [place] [address]      Provider Number: [id number]  Attending Physician Name and Address:  Enzo Bi, MD  Relative Name and Phone Number:       Current Level of Care: Hospital Recommended Level of Care: [place] Prior Approval Number:    Date Approved/Denied:   PASRR Number: [id number]  Discharge Plan: SNF    Current Diagnoses: Patient Active Problem List   Diagnosis Date Noted  . Acute respiratory distress syndrome (ARDS) due to COVID-19 virus ([place]) 12/31/2019  . Acute renal failure superimposed on stage 3b chronic kidney disease ([place]) 12/31/2019  . Hyperkalemia 12/31/2019  . Hyponatremia 12/31/2019  . Morbidly obese ([place]) 12/29/2019  . Pneumonia due to COVID-19 virus 12/22/2019  . Acute respiratory failure with hypoxemia ([place]) 12/22/2019  . Severe sepsis ([place]) 12/22/2019  . COVID-19 12/22/2019  . Chronic systolic heart failure ([place]) 07/21/2016  . Obstructive sleep apnea 04/21/2016  . Nonischemic cardiomyopathy ([place]) 05/26/2015  . Chronic diastolic heart failure ([place]) 04/01/2015  . Essential hypertension 04/01/2015  . Sinus bradycardia 01/29/2015  . Paroxysmal atrial fibrillation ([place]) 08/16/2014  . Paroxysmal atrial flutter ([place]) 08/14/2014  . Chronic kidney disease (CKD), stage III (moderate) ([place]) 07/02/2014  . Hypertensive heart disease   . Hyperlipidemia     Orientation RESPIRATION BLADDER Height & Weight     Self, Situation, Time, Place  O2 ([place] 2L) Incontinent Weight: 269 lb 10 oz (122.3 kg) Height:  5\' 11"  (180.3 cm)  BEHAVIORAL SYMPTOMS/MOOD NEUROLOGICAL BOWEL NUTRITION STATUS      Incontinent Diet (renal diet, with thin  liquids)  AMBULATORY STATUS COMMUNICATION OF NEEDS Skin   Extensive Assist Verbally Normal                       Personal Care Assistance Level of Assistance  Dressing, Feeding, Bathing Bathing Assistance: Maximum assistance Feeding assistance: Independent Dressing Assistance: Maximum assistance     Functional Limitations Info  Sight, Hearing, Speech Sight Info: Adequate Hearing Info: Adequate Speech Info: Adequate    SPECIAL CARE FACTORS FREQUENCY  PT (By licensed PT), OT (By licensed OT)     PT Frequency: 5x OT Frequency: 5x            Contractures Contractures Info: Not present    Additional Factors Info  Code Status, Allergies Code Status Info: Full Code Allergies Info: Esomeprazole Magnesium, Eggs Or Egg-derived Products           Current Medications (01/03/2020):  This is the current hospital active medication list Current Facility-Administered Medications  Medication Dose Route Frequency Provider Last Rate Last Admin  . allopurinol (ZYLOPRIM) tablet 50 mg  50 mg Oral Daily Sharen Hones, MD   50 mg at 01/03/20 1008  . amiodarone (PACERONE) tablet 200 mg  200 mg Oral Daily Rosine Door, MD   200 mg at 01/03/20 1004  . amLODipine (NORVASC) tablet 10 mg  10 mg Oral Daily Murlean Iba, MD   10 mg at 01/03/20 1004  . apixaban (ELIQUIS) tablet 5 mg  5 mg Oral BID [name], RPH   5 mg at 01/03/20 1004  . atorvastatin (LIPITOR) tablet 80 mg  80 mg Oral QHS Bashir,  Khalid, MD   80 mg at 01/02/20 2144  . baricitinib (OLUMIANT) tablet 2 mg  2 mg Oral Daily Oswald Hillock, RPH   2 mg at 01/03/20 1003  . carvedilol (COREG) tablet 3.125 mg  3.125 mg Oral BID WC Rosine Door, MD   3.125 mg at 01/03/20 1004  . Chlorhexidine Gluconate Cloth 2 % PADS 6 each  6 each Topical Daily Awilda Bill, NP   6 each at 01/03/20 1008  . docusate sodium (COLACE) capsule 100 mg  100 mg Oral BID PRN Rosine Door, MD      . feeding supplement (NEPRO CARB STEADY)  liquid 237 mL  237 mL Oral BID BM Sharen Hones, MD   237 mL at 01/03/20 1008  . guaiFENesin-dextromethorphan (ROBITUSSIN DM) 100-10 MG/5ML syrup 5 mL  5 mL Oral Q4H PRN Rust-Chester, Britton L, NP   5 mL at 12/28/19 0436  . hydrALAZINE (APRESOLINE) injection 10 mg  10 mg Intravenous Q6H PRN Rust-Chester, Britton L, NP   10 mg at 12/28/19 0437  . HYDROcodone-acetaminophen (NORCO/VICODIN) 5-325 MG per tablet 1 tablet  1 tablet Oral Q6H PRN Rosine Door, MD   1 tablet at 12/27/19 2113  . insulin aspart (novoLOG) injection 0-20 Units  0-20 Units Subcutaneous TID AC & HS Rust-Chester, Britton L, NP   3 Units at 01/02/20 1644  . insulin aspart (novoLOG) injection 3 Units  3 Units Subcutaneous TID with meals Rust-Chester, Huel Cote, NP   3 Units at 01/03/20 1252  . levothyroxine (SYNTHROID) tablet 100 mcg  100 mcg Oral QAC breakfast Rosine Door, MD   100 mcg at 01/03/20 1004  . polyethylene glycol (MIRALAX / GLYCOLAX) packet 17 g  17 g Oral Daily PRN Rosine Door, MD      . sodium bicarbonate tablet 1,300 mg  1,300 mg Oral BID Kolluru, Sarath, MD   1,300 mg at 01/03/20 1003  . sodium zirconium cyclosilicate (LOKELMA) packet 10 g  10 g Oral Daily Kolluru, Sarath, MD   10 g at 01/03/20 1003  . traZODone (DESYREL) tablet 50 mg  50 mg Oral QHS Rust-Chester, Britton L, NP   50 mg at 01/02/20 2144     Discharge Medications: Please see discharge summary for a list of discharge medications.  Relevant Imaging Results:  Relevant Lab Results:   Additional Information [id number]  Gerrianne Scale Richie Bonanno, LCSW

## 2020-01-03 NOTE — TOC Progression Note (Signed)
Transition of Care [facility]) - Progression Note    Patient Details  Name: LEJON AFZAL MRN: [id number] Date of Birth: 1965/03/06  Transition of Care [facility]) CM/SW [place], LCSW Phone Number: 01/03/2020, 9:52 AM  Clinical Narrative:   Oxygen will be set up through [employer], will need 02 order and walking sat--MD and RN notified.   CSW still unable to find a [place] agency to service pt [facility]--can not service pt [facility]- can not service pt [facility]- can not service pt [facility]-can not service pt [facility]- can not service pt [facility]- can not service pt    Expected Discharge Plan: [place] Barriers to Discharge: Continued Medical Work up  Expected Discharge Plan and Services Expected Discharge Plan: [place] In-house Referral: NA   Post Acute Care Choice: [place] arrangements for the past 2 months: Single Family Home Expected Discharge Date: 01/03/20               DME Arranged: Oxygen DME Agency: Other - Comment [name]) Date DME Agency Contacted: 01/03/20 Time DME Agency Contacted: [phone] Representative spoke with at DME Agency: [place] ([place]) Interventions    Readmission Risk Interventions Readmission Risk Prevention Plan 01/02/2020  Transportation Screening Complete  PCP or Specialist Appt within 3-5 Days Complete  HRI or [place] Complete  Social Work Consult for [place] Planning/Counseling Complete  Palliative Care Screening Not Applicable  Medication Review Press photographer) Complete  Some recent data might be hidden

## 2020-01-03 NOTE — Progress Notes (Signed)
SATURATION QUALIFICATIONS: (This note is used to comply with regulatory documentation for home oxygen)  Patient Saturations on Room Air at Rest = 97%  Patient Saturations on Room Air while Sitting on [place] of Bed = 80%  Patient Saturations on 2 Liters of oxygen while Sitting on Edge of Bed = 97%  Justice Britain, RN 01/03/2020 10:22 AM

## 2020-01-03 NOTE — TOC Progression Note (Signed)
Transition of Care [facility]) - Progression Note    Patient Details  Name: Hector [name] MRN: [id number] Date of Birth: [birth year]  [facility]) CM/SW Contact  [name], LCSW Phone Number: 01/03/2020, 2:38 PM  Clinical Narrative:   Per RN not pt is agreeable to SNF. CSW will do bed search.    Expected Discharge Plan: [place] Barriers to Discharge: Continued Medical Work up  Expected Discharge Plan and Services Expected Discharge Plan: [place] In-house Referral: NA   Post Acute Care Choice: [place] arrangements for the past 2 months: Single Family Home Expected Discharge Date: 01/03/20               DME Arranged: Oxygen DME Agency: Other - Comment [name]) Date DME Agency Contacted: 01/03/20 Time DME Agency Contacted: [phone] Representative spoke with at DME Agency: [place] ([place]) Interventions    Readmission Risk Interventions Readmission Risk Prevention Plan 01/02/2020  Transportation Screening Complete  PCP or Specialist Appt within 3-5 Days Complete  HRI or [place] Complete  Social Work Consult for [place] Planning/Counseling Complete  Palliative Care Screening Not Applicable  Medication Review Press photographer) Complete  Some recent data might be hidden

## 2020-01-03 NOTE — Clinical Social Work Note (Signed)
    Durable Medical Equipment  (From admission, onward)         Start     Ordered   01/03/20 1054  For home use only DME standard manual wheelchair with seat cushion  Once       Comments: Patient suffers from severe weakness and hypoxia with standing which impairs their ability to perform daily activities in the home.  A walker will not resolve issue with performing activities of daily living. A wheelchair will allow patient to safely perform daily activities. Patient can safely propel the wheelchair in the home or has a caregiver who can provide assistance. Length of need 6 months . Accessories: elevating leg rests (ELRs), wheel locks, extensions and anti-tippers.   01/03/20 1053   01/03/20 1035  For home use only DME oxygen  Once       Question Answer Comment  Length of Need 6 Months   Mode or (Route) Nasal cannula   Liters per Minute 2   Frequency Continuous (stationary and portable oxygen unit needed)   Oxygen delivery system Gas      01/03/20 1034

## 2020-01-03 NOTE — Progress Notes (Signed)
PROGRESS NOTE    Hector [name]  TZG:[id number] DOB: 07-Jun-1965 DOA: 12/22/2019 PCP: [name], MD   Chief complaint.  Shortness of breath. Brief Narrative:  Hector [name] is a 54 year old male with past medical history of congestive heart failure chronic kidney disease hyperlipidemia paroxysmal atrial fibrillation and alcohol use who presented to the emergency room today with 2 to 3 weeks history of shortness of breath associated with dry cough and generalized weakness. In the emergency room, his oxygen saturation was 70%, he was placed on 100% nonbreather and admitted to the ICU. He has been requiring heated high flow since being there. He was started on IV steroids, completed remdesivir. He was started on baricitinib, his CRP has normalized, baricitinib was discontinued.  II was assigned to this patient on 12/28/2019. Patient still using high flow oxygen.Normal BNP, procalcitonin level less than 0.01.  Due to worsening hypoxemia, baricitinib was restarted on 11/6.   11/8.  Patient condition finally improved, on 4 L oxygen.  Anticipating discharge in 2 or 3 days.   Assessment & Plan:   Principal Problem:   Pneumonia due to COVID-19 virus Active Problems:   Hypertensive heart disease   Hyperlipidemia   Chronic kidney disease (CKD), stage III (moderate) (HCC)   Paroxysmal atrial fibrillation (HCC)   Chronic diastolic heart failure (HCC)   Essential hypertension   Nonischemic cardiomyopathy (HCC)   Chronic systolic heart failure (HCC)   Acute respiratory failure with hypoxemia (HCC)   Severe sepsis ([place])   COVID-19   Morbidly obese (HCC)   Acute respiratory distress syndrome (ARDS) due to COVID-19 virus (HCC)   Acute renal failure superimposed on stage 3b chronic kidney disease (HCC)   Hyperkalemia   Hyponatremia  #1.  Acute hypoxemic respiratory failure, ARDS, COVID-19 pneumonia. --Today, Pt was weaned down to RA at rest, however, when sitting  up at side of bed, pt became hypoxic and needed 2L O2.   --completed steroids and remdesivir. PLAN: --cont baricitinib while inpatient  #2 Acute kidney injury on chronic kidney disease stage IIIb metabolic acidosis, resolved --Nephrology consulted --cont to hold lasix, Entresto, per nephrology --d/c sodium bicarb  hyperkalemia  --cont Lokelma  3.  Chronic combined systolic and diastolic congestive heart failure. Stable clinically --cont to hold lasix, Entresto, per nephrology due to AKI  4.  Severe sepsis. Resolved  5.  Paroxysmal atrial fibrillation. Continue Eliquis.    DVT prophylaxis: Eliquis Code Status: Full Family Communication: None Disposition Plan:   Status is: Inpatient  Remains inpatient appropriate because:Inpatient level of care appropriate due to severity of illness   Dispo: The patient is from: Home              Anticipated d/c is to: SNF              Anticipated d/c date is: whenever bed available              Patient currently is medically stable to d/c.      I/O last 3 completed shifts: In: 1080 [P.O.:1080] Out: 3300 [Urine:3300] No intake/output data recorded.     Consultants:   None  Procedures: None  Antimicrobials: None  Subjective: Pt was weaned down to RA at rest, however, when sitting up at side of bed, pt became hypoxic and needed 2L O2.    Pt has been refusing PT and SNF rehab, so was originally planned for discharge today, however, pt's mother (who owns the house they lived in) refused for pt  to go back home, so pt now is agreeable to SNF.   Objective: Vitals:   01/03/20 0500 01/03/20 0510 01/03/20 0900 01/03/20 1244  BP:  139/85 134/78 (!) 153/98  Pulse:  81 80 84  Resp:  16 16 16   Temp:  98.1 F (36.7 C) 98.4 F (36.9 C) 98.4 F (36.9 C)  TempSrc:  Oral Oral Oral  SpO2:  96% 95% (!) 88%  Weight: 122.3 kg     Height:        Intake/Output Summary (Last 24 hours) at 01/03/2020 1538 Last data filed at  01/03/2020 0300 Gross per 24 hour  Intake 480 ml  Output 1450 ml  Net -970 ml   Filed Weights   01/01/20 0506 01/02/20 0433 01/03/20 0500  Weight: 122.2 kg 120.2 kg 122.3 kg    Examination:  Constitutional: NAD, alert, oriented, not very responsive to questions HEENT: conjunctivae and lids normal, EOMI CV: No cyanosis.   RESP: normal respiratory effort, on RA Extremities: No effusions, edema in BLE SKIN: warm, dry and intact Neuro: II - XII grossly intact.   Psych: flat mood and affect.     Data Reviewed: I have personally reviewed following labs and imaging studies  CBC: Recent Labs  Lab 12/28/19 0802 12/29/19 0633 12/31/19 0415 01/02/20 0413 01/03/20 0606  WBC 18.5* 18.8* 19.8* 23.0* 19.4*  NEUTROABS 13.9* 14.2* 15.3* 19.0* 16.0*  HGB 15.2 15.5 15.2 16.1 15.9  HCT 46.7 47.4 45.8 47.5 47.4  MCV 87.1 87.1 85.9 85.9 87.0  PLT 349 405* 413* 370 400   Basic Metabolic Panel: Recent Labs  Lab 12/28/19 0802 12/28/19 0802 12/29/19 8676 12/30/19 0014 12/31/19 0415 01/02/20 0413 01/03/20 0606  NA 134*   < > 132* 130* 133* 134* 131*  K 5.4*   < > 5.6* 4.9 5.0 5.1 5.0  CL 104   < > 102 98 101 99 98  CO2 18*   < > 18* 20* 22 24 23   GLUCOSE 140*   < > 129* 127* 116* 94 92  BUN 59*   < > 61* 72* 87* 87* 80*  CREATININE 2.22*   < > 2.44* 2.60* 3.02* 2.58* 2.33*  CALCIUM 8.8*   < > 8.9 8.7* 8.6* 8.7* 8.5*  MG 2.1   < > 2.0 1.9 2.3 2.4 2.2  PHOS 4.3  --   --  5.3*  --   --   --    < > = values in this interval not displayed.   GFR: Estimated Creatinine Clearance: 47.7 mL/min (A) (by C-G formula based on SCr of 2.33 mg/dL (H)). Liver Function Tests: No results for input(s): AST, ALT, ALKPHOS, BILITOT, PROT, ALBUMIN in the last 168 hours. No results for input(s): LIPASE, AMYLASE in the last 168 hours. No results for input(s): AMMONIA in the last 168 hours. Coagulation Profile: No results for input(s): INR, PROTIME in the last 168 hours. Cardiac Enzymes: No results  for input(s): CKTOTAL, CKMB, CKMBINDEX, TROPONINI in the last 168 hours. BNP (last 3 results) No results for input(s): PROBNP in the last 8760 hours. HbA1C: No results for input(s): HGBA1C in the last 72 hours. CBG: Recent Labs  Lab 01/02/20 1208 01/02/20 1613 01/02/20 2056 01/03/20 0909 01/03/20 1230  GLUCAP 118* 133* 105* 81 109*   Lipid Profile: No results for input(s): CHOL, HDL, LDLCALC, TRIG, CHOLHDL, LDLDIRECT in the last 72 hours. Thyroid Function Tests: No results for input(s): TSH, T4TOTAL, FREET4, T3FREE, THYROIDAB in the last 72 hours. Anemia Panel:  Recent Labs    01/02/20 0413  FERRITIN 1,115*   Sepsis Labs: Recent Labs  Lab 12/29/19 0633  PROCALCITON <0.10    No results found for this or any previous visit (from the past 240 hour(s)).       Radiology Studies: No results found.      Scheduled Meds: . allopurinol  50 mg Oral Daily  . amiodarone  200 mg Oral Daily  . amLODipine  10 mg Oral Daily  . apixaban  5 mg Oral BID  . atorvastatin  80 mg Oral QHS  . baricitinib  2 mg Oral Daily  . carvedilol  3.125 mg Oral BID WC  . Chlorhexidine Gluconate Cloth  6 each Topical Daily  . feeding supplement (NEPRO CARB STEADY)  237 mL Oral BID BM  . insulin aspart  0-20 Units Subcutaneous TID AC & HS  . insulin aspart  3 Units Subcutaneous TID with meals  . levothyroxine  100 mcg Oral QAC breakfast  . sodium bicarbonate  1,300 mg Oral BID  . sodium zirconium cyclosilicate  10 g Oral Daily  . traZODone  50 mg Oral QHS   Continuous Infusions:   LOS: 12 days    Enzo Bi, MD [facility]   To contact the attending provider between 7A-7P or the covering provider during after hours 7P-7A, please log into the web site [email or web address] and access using universal  password for that web site. If you do not have the password, please call the hospital operator.  01/03/2020, 3:38 PM

## 2020-01-03 NOTE — Progress Notes (Signed)
Physical Therapy Treatment Patient Details Name: Hector [name] MRN: [id number] DOB: November 15, 1965 Today's Date: 01/03/2020    History of Present Illness Pt is a 54 y/o M with PMH: CKD3, AFib, HLD, CHF, acohol abuse, and hypothyroidism who presented to ER secondary to progressive SOB, cough, weakness; admitted for management of acute respiratory failure with ARDS related to COVID-19.  Hospital course additionally significant for onset of L UE > LE numbness, coordination deficits; head CT negative, patient refusing MRI per chart.    PT Comments    Pt was supine in bed with HOB elevated ~ 20 degrees upon arriving. He was unaware of DC orders already in place. Therapist discussed safety concerns and pt's limitations. Pt very unwilling to DC to rehab. Requires 2 L  O2 top maintain safe saturation levels.   Explained to him that MDs think he is stabile to DC. He has been extremely self limiting over hospitalization and has refused most therapy sessions throughout. He was willing to attempt OOB this session. Required mod assist to exit L side of bed. +2 assistance to attempt standing. Unable to tolerate standing with very poor standing posture. Therapist questions pt's effort. If pt is to DC home today, recommend W/C, EMS transport, and 3 in 1. He does have personal RW in room already.   Patient suffers from physical limitations which impairs his/her ability to perform daily activities like toileting, feeding, dressing, grooming, bathing in the home. A cane, walker, crutch will not resolve the patient's issue with performing activities of daily living. A lightweight wheelchair and cushion is required/recommended and will allow patient to safely perform daily activities.       Follow Up Recommendations  SNF     Equipment Recommendations  Wheelchair (measurements PT);Wheelchair cushion (measurements PT);3in1 (PT);Other (comment) (pt has personal RW in room)    Recommendations for Other Services        Precautions / Restrictions Precautions Precautions: Fall Restrictions Weight Bearing Restrictions: No    Mobility  Bed Mobility Overal bed mobility: Needs Assistance Bed Mobility: Supine to Sit;Sit to Supine     Supine to sit: Mod assist Sit to supine: Min assist      Transfers Overall transfer level: Needs assistance Equipment used: Rolling walker (2 wheeled) Transfers: Sit to/from Stand Sit to Stand: +2 safety/equipment;+2 physical assistance;From elevated surface;Mod assist         General transfer comment: +2 assist to stand with very poor posture and only for a few secounds prior to pt sitting back down. Unwilling to try a secound attempt  Ambulation/Gait  General Gait Details: unable and pt unwilling to try     Balance Overall balance assessment: Needs assistance Sitting-balance support: No upper extremity supported;Feet supported Sitting balance-Leahy Scale: Good Sitting balance - Comments: no LOB in sitting   Standing balance support: During functional activity;Bilateral upper extremity supported Standing balance-Leahy Scale: Poor Standing balance comment: unable to static stand in RW with +2 assist for safety. pt very fatigue and has not been OOB and over a week.       Cognition Arousal/Alertness: Awake/alert Behavior During Therapy: Flat affect Overall Cognitive Status: Within Functional Limits for tasks assessed      General Comments: Pt is self-limiting         General Comments General comments (skin integrity, edema, etc.): Pt is extremely deconditioned and weak from low activity over hospiltlization. Highly recommend DC to SNF but pt unwilling      Pertinent Vitals/Pain Pain Assessment: 0-10 Pain Score:  4  Faces Pain Scale: Hurts a little bit Pain Location: L hand  Pain Descriptors / Indicators: Grimacing Pain Intervention(s): Limited activity within patient's tolerance;Repositioned;Monitored during session           PT Goals  (current goals can now be found in the care plan section) Acute Rehab PT Goals Patient Stated Goal: to get my strength back Progress towards PT goals: Not progressing toward goals - comment (self limiting with strength deficits from limited mobility)    Frequency    Min 2X/week      PT Plan Discharge plan needs to be updated       AM-PAC PT "6 Clicks" Mobility   Outcome Measure  Help needed turning from your back to your side while in a flat bed without using bedrails?: A Little Help needed moving from lying on your back to sitting on the side of a flat bed without using bedrails?: A Little Help needed moving to and from a bed to a chair (including a wheelchair)?: A Lot Help needed standing up from a chair using your arms (e.g., wheelchair or bedside chair)?: A Lot Help needed to walk in hospital room?: A Lot Help needed climbing 3-5 steps with a railing? : A Lot 6 Click Score: 14    End of Session Equipment Utilized During Treatment: Oxygen Activity Tolerance: Patient limited by pain;Patient limited by fatigue;Other (comment) (self limiting) Patient left: in bed;with call bell/phone within reach;with bed alarm set Nurse Communication: Mobility status PT Visit Diagnosis: Muscle weakness (generalized) (M62.81);Difficulty in walking, not elsewhere classified (R26.2)     Time: 0950-1020 PT Time Calculation (min) (ACUTE ONLY): 30 min  Charges:  $Therapeutic Activity: 23-37 mins                     Julaine Fusi PTA 01/03/20, 10:47 AM

## 2020-01-04 DIAGNOSIS — J1282 Pneumonia due to coronavirus disease 2019: Secondary | ICD-10-CM | POA: Diagnosis not present

## 2020-01-04 DIAGNOSIS — U071 COVID-19: Secondary | ICD-10-CM | POA: Diagnosis not present

## 2020-01-04 LAB — MAGNESIUM: Magnesium: 2.1 mg/dL (ref 1.7–2.4)

## 2020-01-04 LAB — BASIC METABOLIC PANEL
Anion gap: 11 (ref 5–15)
BUN: 69 mg/dL — ABNORMAL HIGH (ref 6–20)
CO2: 23 mmol/L (ref 22–32)
Calcium: 8.4 mg/dL — ABNORMAL LOW (ref 8.9–10.3)
Chloride: 101 mmol/L (ref 98–111)
Creatinine, Ser: 2.21 mg/dL — ABNORMAL HIGH (ref 0.61–1.24)
GFR, Estimated: 34 mL/min — ABNORMAL LOW (ref >60)
Glucose, Bld: 93 mg/dL (ref 70–99)
Potassium: 4.6 mmol/L (ref 3.5–5.1)
Sodium: 135 mmol/L (ref 135–145)

## 2020-01-04 LAB — GLUCOSE, CAPILLARY
Glucose-Capillary: 73 mg/dL (ref 70–99)
Glucose-Capillary: 97 mg/dL (ref 70–99)
Glucose-Capillary: 156 mg/dL — ABNORMAL HIGH (ref 70–99)
Glucose-Capillary: 131 mg/dL — ABNORMAL HIGH (ref 70–99)

## 2020-01-04 LAB — CBC
HCT: 47.7 % (ref 39.0–52.0)
Hemoglobin: 15.8 g/dL (ref 13.0–17.0)
MCH: 28.9 pg (ref 26.0–34.0)
MCHC: 33.1 g/dL (ref 30.0–36.0)
MCV: 87.2 fL (ref 80.0–100.0)
Platelets: 258 10*3/uL (ref 150–400)
RBC: 5.47 MIL/uL (ref 4.22–5.81)
RDW: 14.2 % (ref 11.5–15.5)
WBC: 15 10*3/uL — ABNORMAL HIGH (ref 4.0–10.5)
nRBC: 0 % (ref 0.0–0.2)

## 2020-01-04 MED ORDER — LOPERAMIDE HCL 2 MG PO CAPS
2.0000 mg | ORAL_CAPSULE | Freq: Once | ORAL | Status: AC
  Administered 2020-01-04: 2 mg via ORAL
  Filled 2020-01-04: qty 1

## 2020-01-04 NOTE — Progress Notes (Signed)
Occupational Therapy Treatment Patient Details Name: Hector [name] MRN: [id number] DOB: [birth year] Today's Date: 01/04/2020    History of present illness Pt is a 54 y/o M with PMH: CKD3, AFib, HLD, CHF, acohol abuse, and hypothyroidism who presented to ER secondary to progressive SOB, cough, weakness; admitted for management of acute respiratory failure with ARDS related to COVID-19.  Hospital course additionally significant for onset of L UE > LE numbness, coordination deficits; head CT negative, patient refusing MRI per chart.   OT comments  Pt seen for OT tx this date to f/u re: safety with ADLs/ADL mobility. Pt declines to mobilize with OT this session and is self-limiting, but he is minimally agreeable to some ADL participation. OT facilitates pt participation in grooming/hygiene bed level with MIN A for deodorant to L UE and SETUP for oral care. In addition, OT elevates pt's L UE that is somewhat swollen and uncomfortable and ed provided re: rationale. Continue to anticipate SNF is most appropriate d/c recommendation.    Follow Up Recommendations  SNF    Equipment Recommendations  Other (comment) (defer)    Recommendations for Other Services      Precautions / Restrictions Precautions Precautions: Fall Restrictions Weight Bearing Restrictions: No       Mobility Bed Mobility               General bed mobility comments: declines to perform bed mobility  Transfers                 General transfer comment: declines to perform transfers with OT    Balance                                           ADL either performed or assessed with clinical judgement   ADL Overall ADL's : Needs assistance/impaired     Grooming: Oral care;Set up;Applying deodorant;Minimal assistance;Bed level Grooming Details (indicate cue type and reason): MIN A to assist with applying DO to L underarm, pt reports L UE is somewhat swollen and uncomfortable. Pt  able to apply lip balm and rinse mouth with SETUP. Pt is reluctant to participate and requires gentle MOD encouragement to motivate.                                     Vision Patient Visual Report: No change from baseline     Perception     Praxis      Cognition Arousal/Alertness: Awake/alert Behavior During Therapy: Flat affect Overall Cognitive Status: Within Functional Limits for tasks assessed                                 General Comments: Pt is self-limiting        Exercises Other Exercises Other Exercises: OT facilitates pt participation in grooming/hygiene bed level with MIN A for deodorant to L UE and SETUP for oral care. In addition, OT elevates pt's L UE that is somewhat swollen and uncomfortable and ed provided re: rationale.   Shoulder Instructions       General Comments      Pertinent Vitals/ Pain       Pain Assessment: 0-10 Pain Score: 5  Pain Location: L hand/UE Pain Descriptors / Indicators: Grimacing;Guarding Pain  Intervention(s): Limited activity within patient's tolerance;Monitored during session;Repositioned (elevated on pillow and ed re: rationale with patient)  Home Living                                          Prior Functioning/Environment              Frequency  Min 1X/week        Progress Toward Goals  OT Goals(current goals can now be found in the care plan section)  Progress towards OT goals: OT to reassess next treatment  Acute Rehab OT Goals Patient Stated Goal: to get my strength back OT Goal Formulation: With patient Time For Goal Achievement: 01/09/20 Potential to Achieve Goals: [employer] Discharge plan needs to be updated;Frequency remains appropriate    Co-evaluation                 AM-PAC OT "6 Clicks" Daily Activity     Outcome Measure   Help from another person eating meals?: None Help from another person taking care of personal grooming?: A  Little Help from another person toileting, which includes using toliet, bedpan, or urinal?: A Lot Help from another person bathing (including washing, rinsing, drying)?: A Lot Help from another person to put on and taking off regular upper body clothing?: A Little Help from another person to put on and taking off regular lower body clothing?: A Lot 6 Click Score: 16    End of Session    OT Visit Diagnosis: Muscle weakness (generalized) (M62.81);Hemiplegia and hemiparesis Hemiplegia - Right/Left: Left Hemiplegia - caused by: Unspecified   Activity Tolerance Patient tolerated treatment well;Other (comment) (self-limiting)   Patient Left in bed;with call bell/phone within reach;with bed alarm set   Nurse Communication          Time: 8841-6606 OT Time Calculation (min): 8 min  Charges: OT General Charges $OT Visit: 1 Visit OT Treatments $Self Care/Home Management : 8-22 mins  Gerrianne Scale, [place], OTR/L ascom [phone] 01/04/20, 11:01 AM

## 2020-01-04 NOTE — Plan of Care (Signed)
  Problem: Education: Goal: Knowledge of risk factors and measures for prevention of condition will improve Outcome: Progressing   Problem: Coping: Goal: Psychosocial and spiritual needs will be supported Outcome: Progressing   Problem: Respiratory: Goal: Will maintain a patent airway Outcome: Progressing   Problem: Education: Goal: Knowledge of disease and its progression will improve Outcome: Progressing

## 2020-01-04 NOTE — TOC Progression Note (Signed)
Transition of Care [facility]) - Progression Note    Patient Details  Name: Hector [name] MRN: [id number] Date of Birth: April 04, 1965  Transition of [facility]) CM/SW Contact  [name], LCSW Phone Number: 01/04/2020, 3:13 PM  Clinical Narrative:   NO bed offers at this time. Bed search extended to [facility].    Expected Discharge Plan: [place] Barriers to Discharge: Continued Medical Work up  Expected Discharge Plan and Services Expected Discharge Plan: [place] In-house Referral: NA   Post Acute Care Choice: [place] arrangements for the past 2 months: Single Family Home Expected Discharge Date: 01/03/20               DME Arranged: Oxygen DME Agency: Other - Comment [name]) Date DME Agency Contacted: 01/03/20 Time DME Agency Contacted: [phone] Representative spoke with at DME Agency: [place] ([place]) Interventions    Readmission Risk Interventions Readmission Risk Prevention Plan 01/02/2020  Transportation Screening Complete  PCP or Specialist Appt within 3-5 Days Complete  HRI or [place] Complete  Social Work Consult for [place] Planning/Counseling Complete  Palliative Care Screening Not Applicable  Medication Review [employer]) Complete  Some recent data might be hidden

## 2020-01-04 NOTE — Progress Notes (Signed)
PROGRESS NOTE    Hector [name]  ZOX:[id number] DOB: 12-Mar-1965 DOA: 12/22/2019 PCP: [name], MD   Chief complaint.  Shortness of breath. Brief Narrative:  [name] is a 54 year old male with past medical history of congestive heart failure chronic kidney disease hyperlipidemia paroxysmal atrial fibrillation and alcohol use who presented to the emergency room today with 2 to 3 weeks history of shortness of breath associated with dry cough and generalized weakness. In the emergency room, his oxygen saturation was 70%, he was placed on 100% nonbreather and admitted to the ICU. He has been requiring heated high flow since being there. He was started on IV steroids, completed remdesivir. He was started on baricitinib, his CRP has normalized, baricitinib was discontinued.  II was assigned to this patient on 12/28/2019. Patient still using high flow oxygen.Normal BNP, procalcitonin level less than 0.01.  Due to worsening hypoxemia, baricitinib was restarted on 11/6.   11/8.  Patient condition finally improved, on 4 L oxygen.  Anticipating discharge in 2 or 3 days.   Assessment & Plan:   Principal Problem:   Pneumonia due to COVID-19 virus Active Problems:   Hypertensive heart disease   Hyperlipidemia   Chronic kidney disease (CKD), stage III (moderate) (HCC)   Paroxysmal atrial fibrillation (HCC)   Chronic diastolic heart failure (HCC)   Essential hypertension   Nonischemic cardiomyopathy (HCC)   Chronic systolic heart failure (HCC)   Acute respiratory failure with hypoxemia (HCC)   Severe sepsis ([place])   COVID-19   Morbidly obese (HCC)   Acute respiratory distress syndrome (ARDS) due to COVID-19 virus (HCC)   Acute renal failure superimposed on stage 3b chronic kidney disease (HCC)   Hyperkalemia   Hyponatremia  #1.  Acute hypoxemic respiratory failure, ARDS, COVID-19 pneumonia. --Today, Pt was weaned down to RA at rest, however, when sitting  up at side of bed, pt became hypoxic and needed 2L O2.   --completed steroids and remdesivir. PLAN: --cont baricitinib while inpatient  #2 Acute kidney injury on chronic kidney disease stage IIIb metabolic acidosis, resolved --Nephrology consulted --cont to hold lasix, Entresto, per nephrology  hyperkalemia  --cont Lokelma  3.  Chronic combined systolic and diastolic congestive heart failure. Stable clinically --cont to hold lasix, Entresto, per nephrology due to AKI  4.  Severe sepsis. Resolved  5.  Paroxysmal atrial fibrillation. Acquired thrombophilia  Continue Eliquis. --cont admiodraone and coreg  DM2 --A1c 7.7 --mealtime 3u TID --SSI  Hypothyroidism --cont Synthroid  HLD --cont Lipitor 80 mg daily   DVT prophylaxis: Eliquis Code Status: Full Family Communication: None Disposition Plan:   Status is: Inpatient  Remains inpatient appropriate because:Inpatient level of care appropriate due to severity of illness   Dispo: The patient is from: Home              Anticipated d/c is to: SNF              Anticipated d/c date is: whenever bed available              Patient currently is medically stable to d/c.    I/O last 3 completed shifts: In: 120 [P.O.:120] Out: 950 [Urine:950] Total I/O In: -  Out: 600 [Urine:600]     Consultants:   None  Procedures: None  Antimicrobials: None  Subjective: No dyspnea.  Not eating much due to not liking the food, but agreed to drink Ensure.     Objective: Vitals:   01/04/20 0640 01/04/20 0826 01/04/20  0900 01/04/20 1153  BP: (!) 155/101 (!) 136/104  (!) 130/99  Pulse: 89 73  80  Resp: 18 17 16 18   Temp: 98.1 F (36.7 C) 97.7 F (36.5 C)  98.7 F (37.1 C)  TempSrc: Oral Oral  Oral  SpO2: 93% 92%  96%  Weight:      Height:        Intake/Output Summary (Last 24 hours) at 01/04/2020 1520 Last data filed at 01/04/2020 1153 Gross per 24 hour  Intake --  Output 600 ml  Net -600 ml   Filed  Weights   01/01/20 0506 01/02/20 0433 01/03/20 0500  Weight: 122.2 kg 120.2 kg 122.3 kg    Examination:  Constitutional: NAD, AAOx3 HEENT: conjunctivae and lids normal, EOMI CV: No cyanosis.   RESP: normal respiratory effort Extremities: No effusions, edema in BLE SKIN: warm, dry and intact Neuro: II - XII grossly intact.     Data Reviewed: I have personally reviewed following labs and imaging studies  CBC: Recent Labs  Lab 12/29/19 0633 12/31/19 0415 01/02/20 0413 01/03/20 0606 01/04/20 0534  WBC 18.8* 19.8* 23.0* 19.4* 15.0*  NEUTROABS 14.2* 15.3* 19.0* 16.0*  --   HGB 15.5 15.2 16.1 15.9 15.8  HCT 47.4 45.8 47.5 47.4 47.7  MCV 87.1 85.9 85.9 87.0 87.2  PLT 405* 413* 370 280 517   Basic Metabolic Panel: Recent Labs  Lab 12/30/19 0014 12/31/19 0415 01/02/20 0413 01/03/20 0606 01/04/20 0534  NA 130* 133* 134* 131* 135  K 4.9 5.0 5.1 5.0 4.6  CL 98 101 99 98 101  CO2 20* 22 24 23 23   GLUCOSE 127* 116* 94 92 93  BUN 72* 87* 87* 80* 69*  CREATININE 2.60* 3.02* 2.58* 2.33* 2.21*  CALCIUM 8.7* 8.6* 8.7* 8.5* 8.4*  MG 1.9 2.3 2.4 2.2 2.1  PHOS 5.3*  --   --   --   --    GFR: Estimated Creatinine Clearance: 50.3 mL/min (A) (by C-G formula based on SCr of 2.21 mg/dL (H)). Liver Function Tests: No results for input(s): AST, ALT, ALKPHOS, BILITOT, PROT, ALBUMIN in the last 168 hours. No results for input(s): LIPASE, AMYLASE in the last 168 hours. No results for input(s): AMMONIA in the last 168 hours. Coagulation Profile: No results for input(s): INR, PROTIME in the last 168 hours. Cardiac Enzymes: No results for input(s): CKTOTAL, CKMB, CKMBINDEX, TROPONINI in the last 168 hours. BNP (last 3 results) No results for input(s): PROBNP in the last 8760 hours. HbA1C: No results for input(s): HGBA1C in the last 72 hours. CBG: Recent Labs  Lab 01/03/20 1230 01/03/20 1723 01/03/20 2029 01/04/20 0824 01/04/20 1151  GLUCAP 109* 94 165* 73 97   Lipid  Profile: No results for input(s): CHOL, HDL, LDLCALC, TRIG, CHOLHDL, LDLDIRECT in the last 72 hours. Thyroid Function Tests: No results for input(s): TSH, T4TOTAL, FREET4, T3FREE, THYROIDAB in the last 72 hours. Anemia Panel: Recent Labs    01/02/20 0413  FERRITIN 1,115*   Sepsis Labs: Recent Labs  Lab 12/29/19 0633  PROCALCITON <0.10    No results found for this or any previous visit (from the past 240 hour(s)).       Radiology Studies: No results found.      Scheduled Meds:  allopurinol  50 mg Oral Daily   amiodarone  200 mg Oral Daily   amLODipine  10 mg Oral Daily   apixaban  5 mg Oral BID   atorvastatin  80 mg Oral QHS   baricitinib  2 mg Oral Daily   carvedilol  3.125 mg Oral BID WC   feeding supplement (NEPRO CARB STEADY)  237 mL Oral BID BM   insulin aspart  0-20 Units Subcutaneous TID AC & HS   insulin aspart  3 Units Subcutaneous TID with meals   levothyroxine  100 mcg Oral QAC breakfast   sodium zirconium cyclosilicate  10 g Oral Daily   traZODone  50 mg Oral QHS   Continuous Infusions:   LOS: 13 days    Enzo Bi, MD [facility]   To contact the attending provider between 7A-7P or the covering provider during after hours 7P-7A, please log into the web site [email or web address] and access using universal [place] password for that web site. If you do not have the password, please call the hospital operator.  01/04/2020, 3:20 PM

## 2020-01-04 NOTE — Progress Notes (Signed)
PT Cancellation Note  Patient Details Name: Hector [name] MRN: [id number] DOB: January 01, 1966   Cancelled Treatment:     PT attempt. Pt refused. Get agitated when attempted to encourage participation. " Can't you see that I'm sleeping!" PT will continue to follow per POC and return at later time/date.     Willette Pa 01/04/2020, 11:06 AM

## 2020-01-05 DIAGNOSIS — J1282 Pneumonia due to coronavirus disease 2019: Secondary | ICD-10-CM | POA: Diagnosis not present

## 2020-01-05 DIAGNOSIS — U071 COVID-19: Secondary | ICD-10-CM | POA: Diagnosis not present

## 2020-01-05 LAB — GLUCOSE, CAPILLARY
Glucose-Capillary: 117 mg/dL — ABNORMAL HIGH (ref 70–99)
Glucose-Capillary: 119 mg/dL — ABNORMAL HIGH (ref 70–99)
Glucose-Capillary: 132 mg/dL — ABNORMAL HIGH (ref 70–99)
Glucose-Capillary: 74 mg/dL (ref 70–99)

## 2020-01-05 NOTE — Progress Notes (Signed)
Physical Therapy Treatment Patient Details Name: Hector [name] MRN: [id number] DOB: 11-24-1965 Today's Date: 01/05/2020    History of Present Illness Pt is a 54 y/o M with PMH: CKD3, AFib, HLD, CHF, acohol abuse, and hypothyroidism who presented to ER secondary to progressive SOB, cough, weakness; admitted for management of acute respiratory failure with ARDS related to COVID-19.  Hospital course additionally significant for onset of L UE > LE numbness, coordination deficits; head CT negative, patient refusing MRI per chart.    PT Comments    Pt agreeable to tx but very self limiting throughout session as he declines attempts to stand, reporting he's weak, with PT providing encouragement/education re: need to try. Pt is able to complete bed mobility with less assistance on this date & tolerates sitting EOB ~10 min without assist or LOB noted. Pt performs BLE ankle pumps, LAQ, and hip adduction pillow squeezes with cuing for technique & encouragement to complete 1-2 sets x 10 reps each. Pt unable to fully extend R knee reporting old knee surgery & pt with decreased ankle ROM (L>R). Pt reports significant pain in L elbow & MD in room, assessing pt. Pt reports inability to extend fingers but is able to do so at end of session. PT performs PROM to L digits and attempts to perform L elbow PROM with pt reporting pain. Encouraged pt to perform PROM to LUE as able & MD may order resting hand splint.   Pt on 2L/min supplemental oxygen with cannula falling out of nose briefly & SpO2 = 87% but increasing to >90% when placed back on nose & with cuing for pursed lip breathing.   Pt would benefit from ongoing acute PT services to progress mobility as pt is willing.    Follow Up Recommendations  SNF     Equipment Recommendations  Wheelchair (measurements PT);Wheelchair cushion (measurements PT);3in1 (PT);Other (comment) (pt has personal RW In room)    Recommendations for Other Services        Precautions / Restrictions Precautions Precautions: Fall Restrictions Weight Bearing Restrictions: No    Mobility  Bed Mobility Overal bed mobility: Needs Assistance Bed Mobility: Supine to Sit;Sit to Supine     Supine to sit: Min assist;HOB elevated Sit to supine: Supervision   General bed mobility comments: PT instructs pt to pull himself upright with UE on bed rail but requests to hold to PT's hand instead, pt requires min assist to upright trunk  Transfers Overall transfer level:  (pt declines sit>stand despite encouragement/education)                  Ambulation/Gait                 Stairs             Wheelchair Mobility    Modified Rankin (Stroke Patients Only)       Balance Overall balance assessment: Needs assistance Sitting-balance support: No upper extremity supported;Feet supported Sitting balance-Leahy Scale: Good Sitting balance - Comments: no LOB in sitting                                    Cognition Arousal/Alertness: Awake/alert Behavior During Therapy: Flat affect Overall Cognitive Status: Within Functional Limits for tasks assessed  General Comments: Pt is self-limiting      Exercises      General Comments        Pertinent Vitals/Pain Pain Assessment: Faces Faces Pain Scale: Hurts little more Pain Location: L hand/UE, neck Pain Descriptors / Indicators: Grimacing;Guarding Pain Intervention(s): Limited activity within patient's tolerance;Monitored during session    Home Living                      Prior Function            PT Goals (current goals can now be found in the care plan section) Acute Rehab PT Goals Patient Stated Goal: to get my strength back PT Goal Formulation: With patient Time For Goal Achievement: 01/09/20 Potential to Achieve Goals: Good Progress towards PT goals: Progressing toward goals    Frequency    Min  2X/week      PT Plan Current plan remains appropriate    Co-evaluation              AM-PAC PT "6 Clicks" Mobility   Outcome Measure  Help needed turning from your back to your side while in a flat bed without using bedrails?: A Little Help needed moving from lying on your back to sitting on the side of a flat bed without using bedrails?: A Little Help needed moving to and from a bed to a chair (including a wheelchair)?: A Lot Help needed standing up from a chair using your arms (e.g., wheelchair or bedside chair)?: A Lot Help needed to walk in hospital room?: A Lot Help needed climbing 3-5 steps with a railing? : A Lot 6 Click Score: 14    End of Session Equipment Utilized During Treatment: Oxygen Activity Tolerance: Patient limited by pain (pt self limiting) Patient left: in bed;with call bell/phone within reach;with bed alarm set   PT Visit Diagnosis: Muscle weakness (generalized) (M62.81);Difficulty in walking, not elsewhere classified (R26.2)     Time: 5726-2035 PT Time Calculation (min) (ACUTE ONLY): 27 min  Charges:  $Therapeutic Activity: 23-37 mins                     Lavone Nian, PT, DPT 01/05/20, 1:22 PM    [name] 01/05/2020, 1:18 PM

## 2020-01-05 NOTE — Progress Notes (Signed)
PROGRESS NOTE    Hector [name]  ERX:[id number] DOB: 1965/07/03 DOA: 12/22/2019 PCP: [name], MD   Chief complaint.  Shortness of breath. Brief Narrative:  [name] is a 54 year old male with past medical history of congestive heart failure chronic kidney disease hyperlipidemia paroxysmal atrial fibrillation and alcohol use who presented to the emergency room today with 2 to 3 weeks history of shortness of breath associated with dry cough and generalized weakness. In the emergency room, his oxygen saturation was 70%, he was placed on 100% nonbreather and admitted to the ICU. He has been requiring heated high flow since being there. He was started on IV steroids, completed remdesivir. He was started on baricitinib, his CRP has normalized, baricitinib was discontinued.  II was assigned to this patient on 12/28/2019. Patient still using high flow oxygen.Normal BNP, procalcitonin level less than 0.01.  Due to worsening hypoxemia, baricitinib was restarted on 11/6.   11/8.  Patient condition finally improved, on 4 L oxygen.  Anticipating discharge in 2 or 3 days.   Assessment & Plan:   Principal Problem:   Pneumonia due to COVID-19 virus Active Problems:   Hypertensive heart disease   Hyperlipidemia   Chronic kidney disease (CKD), stage III (moderate) (HCC)   Paroxysmal atrial fibrillation (HCC)   Chronic diastolic heart failure (HCC)   Essential hypertension   Nonischemic cardiomyopathy (HCC)   Chronic systolic heart failure (HCC)   Acute respiratory failure with hypoxemia (HCC)   Severe sepsis ([place])   COVID-19   Morbidly obese (HCC)   Acute respiratory distress syndrome (ARDS) due to COVID-19 virus (HCC)   Acute renal failure superimposed on stage 3b chronic kidney disease (HCC)   Hyperkalemia   Hyponatremia  #1.  Acute hypoxemic respiratory failure, ARDS, COVID-19 pneumonia. --Today, Pt was weaned down to RA at rest, however, when sitting  up at side of bed, pt became hypoxic and needed 2L O2.   --completed steroids and remdesivir. PLAN: --cont baricitinib while inpatient --Continue supplemental O2 to keep sats >=92%, wean as tolerated  #2 Acute kidney injury on chronic kidney disease stage IIIb metabolic acidosis, resolved --Nephrology consulted --cont to hold lasix, Entresto, per nephrology  hyperkalemia  --cont Lokelma  3.  Chronic combined systolic and diastolic congestive heart failure. Stable clinically --cont to hold lasix, Entresto, per nephrology due to AKI  4.  Severe sepsis. Resolved  5.  Paroxysmal atrial fibrillation. Acquired thrombophilia  Continue Eliquis. --cont admiodraone and coreg  DM2 --A1c 7.7 --mealtime 3u TID --SSI  Hypothyroidism --cont Synthroid  HLD --cont Lipitor 80 mg daily   DVT prophylaxis: Eliquis Code Status: Full Family Communication: None Disposition Plan:   Status is: Inpatient  Remains inpatient appropriate because:Inpatient level of care appropriate due to severity of illness   Dispo: The patient is from: Home              Anticipated d/c is to: SNF              Anticipated d/c date is: whenever bed available              Patient currently is medically stable to d/c.    I/O last 3 completed shifts: In: 360 [P.O.:360] Out: 1600 [Urine:1600] Total I/O In: -  Out: 1000 [Urine:1000]     Consultants:   None  Procedures: None  Antimicrobials: None  Subjective: Pt complained of left elbow pain, left forearm numbness, and difficulty opening his left hand, that he said had been  present since admission.  Drank Ensure.   Objective: Vitals:   01/05/20 0427 01/05/20 0505 01/05/20 0811 01/05/20 1204  BP: (!) 135/95  127/90 (!) 126/98  Pulse: 71  72 81  Resp: 18  17 17   Temp: 98.3 F (36.8 C)  98.4 F (36.9 C) 98.2 F (36.8 C)  TempSrc: Oral  Oral Oral  SpO2: 96%  98% 95%  Weight:  119.7 kg    Height:        Intake/Output Summary (Last 24  hours) at 01/05/2020 1406 Last data filed at 01/05/2020 1202 Gross per 24 hour  Intake 360 ml  Output 2000 ml  Net -1640 ml   Filed Weights   01/02/20 0433 01/03/20 0500 01/05/20 0505  Weight: 120.2 kg 122.3 kg 119.7 kg    Examination:  Constitutional: NAD, AAOx3 HEENT: conjunctivae and lids normal, EOMI CV: No cyanosis.   RESP: normal respiratory effort, on 2L Extremities: No effusions, edema in BLE.  Left UE had more limited ROM, tender to palpation over medial epicondyl, left fingers in contracture.   SKIN: warm, dry and intact Neuro: II - XII grossly intact.     Data Reviewed: I have personally reviewed following labs and imaging studies  CBC: Recent Labs  Lab 12/31/19 0415 01/02/20 0413 01/03/20 0606 01/04/20 0534  WBC 19.8* 23.0* 19.4* 15.0*  NEUTROABS 15.3* 19.0* 16.0*  --   HGB 15.2 16.1 15.9 15.8  HCT 45.8 47.5 47.4 47.7  MCV 85.9 85.9 87.0 87.2  PLT 413* 370 280 381   Basic Metabolic Panel: Recent Labs  Lab 12/30/19 0014 12/31/19 0415 01/02/20 0413 01/03/20 0606 01/04/20 0534  NA 130* 133* 134* 131* 135  K 4.9 5.0 5.1 5.0 4.6  CL 98 101 99 98 101  CO2 20* 22 24 23 23   GLUCOSE 127* 116* 94 92 93  BUN 72* 87* 87* 80* 69*  CREATININE 2.60* 3.02* 2.58* 2.33* 2.21*  CALCIUM 8.7* 8.6* 8.7* 8.5* 8.4*  MG 1.9 2.3 2.4 2.2 2.1  PHOS 5.3*  --   --   --   --    GFR: Estimated Creatinine Clearance: 49.7 mL/min (A) (by C-G formula based on SCr of 2.21 mg/dL (H)). Liver Function Tests: No results for input(s): AST, ALT, ALKPHOS, BILITOT, PROT, ALBUMIN in the last 168 hours. No results for input(s): LIPASE, AMYLASE in the last 168 hours. No results for input(s): AMMONIA in the last 168 hours. Coagulation Profile: No results for input(s): INR, PROTIME in the last 168 hours. Cardiac Enzymes: No results for input(s): CKTOTAL, CKMB, CKMBINDEX, TROPONINI in the last 168 hours. BNP (last 3 results) No results for input(s): PROBNP in the last 8760  hours. HbA1C: No results for input(s): HGBA1C in the last 72 hours. CBG: Recent Labs  Lab 01/04/20 1151 01/04/20 1653 01/04/20 2027 01/05/20 0809 01/05/20 1201  GLUCAP 97 156* 131* 117* 119*   Lipid Profile: No results for input(s): CHOL, HDL, LDLCALC, TRIG, CHOLHDL, LDLDIRECT in the last 72 hours. Thyroid Function Tests: No results for input(s): TSH, T4TOTAL, FREET4, T3FREE, THYROIDAB in the last 72 hours. Anemia Panel: No results for input(s): VITAMINB12, FOLATE, FERRITIN, TIBC, IRON, RETICCTPCT in the last 72 hours. Sepsis Labs: No results for input(s): PROCALCITON, LATICACIDVEN in the last 168 hours.  No results found for this or any previous visit (from the past 240 hour(s)).       Radiology Studies: No results found.      Scheduled Meds: . allopurinol  50 mg Oral Daily  .  amiodarone  200 mg Oral Daily  . amLODipine  10 mg Oral Daily  . apixaban  5 mg Oral BID  . atorvastatin  80 mg Oral QHS  . baricitinib  2 mg Oral Daily  . carvedilol  3.125 mg Oral BID WC  . feeding supplement (NEPRO CARB STEADY)  237 mL Oral BID BM  . insulin aspart  0-20 Units Subcutaneous TID AC & HS  . insulin aspart  3 Units Subcutaneous TID with meals  . levothyroxine  100 mcg Oral QAC breakfast  . sodium zirconium cyclosilicate  10 g Oral Daily  . traZODone  50 mg Oral QHS   Continuous Infusions:   LOS: 14 days    Enzo Bi, MD [facility]   To contact the attending provider between 7A-7P or the covering provider during after hours 7P-7A, please log into the web site [email or web address] and access using universal  password for that web site. If you do not have the password, please call the hospital operator.  01/05/2020, 2:06 PM

## 2020-01-06 DIAGNOSIS — J1282 Pneumonia due to coronavirus disease 2019: Secondary | ICD-10-CM | POA: Diagnosis not present

## 2020-01-06 DIAGNOSIS — U071 COVID-19: Secondary | ICD-10-CM | POA: Diagnosis not present

## 2020-01-06 LAB — BASIC METABOLIC PANEL
Anion gap: 9 (ref 5–15)
BUN: 56 mg/dL — ABNORMAL HIGH (ref 6–20)
CO2: 23 mmol/L (ref 22–32)
Calcium: 8.6 mg/dL — ABNORMAL LOW (ref 8.9–10.3)
Chloride: 100 mmol/L (ref 98–111)
Creatinine, Ser: 2.3 mg/dL — ABNORMAL HIGH (ref 0.61–1.24)
GFR, Estimated: 33 mL/min — ABNORMAL LOW (ref >60)
Glucose, Bld: 105 mg/dL — ABNORMAL HIGH (ref 70–99)
Potassium: 4.5 mmol/L (ref 3.5–5.1)
Sodium: 132 mmol/L — ABNORMAL LOW (ref 135–145)

## 2020-01-06 LAB — CBC
HCT: 44.7 % (ref 39.0–52.0)
Hemoglobin: 14.7 g/dL (ref 13.0–17.0)
MCH: 28.5 pg (ref 26.0–34.0)
MCHC: 32.9 g/dL (ref 30.0–36.0)
MCV: 86.6 fL (ref 80.0–100.0)
Platelets: 200 K/uL (ref 150–400)
RBC: 5.16 MIL/uL (ref 4.22–5.81)
RDW: 14.2 % (ref 11.5–15.5)
WBC: 10.5 K/uL (ref 4.0–10.5)
nRBC: 0 % (ref 0.0–0.2)

## 2020-01-06 LAB — GLUCOSE, CAPILLARY
Glucose-Capillary: 146 mg/dL — ABNORMAL HIGH (ref 70–99)
Glucose-Capillary: 84 mg/dL (ref 70–99)
Glucose-Capillary: 102 mg/dL — ABNORMAL HIGH (ref 70–99)
Glucose-Capillary: 116 mg/dL — ABNORMAL HIGH (ref 70–99)

## 2020-01-06 LAB — MAGNESIUM: Magnesium: 2 mg/dL (ref 1.7–2.4)

## 2020-01-06 NOTE — Plan of Care (Signed)
Problem: Education: Goal: Knowledge of General Education information will improve Description: Including pain rating scale, medication(s)/side effects and non-pharmacologic comfort measures 01/06/2020 1227 by Cristela Blue, RN Outcome: Progressing 01/06/2020 1226 by Cristela Blue, RN Outcome: Progressing   Problem: Health Behavior/Discharge Planning: Goal: Ability to manage health-related needs will improve 01/06/2020 1227 by Cristela Blue, RN Outcome: Progressing 01/06/2020 1226 by Cristela Blue, RN Outcome: Progressing   Problem: Clinical Measurements: Goal: Ability to maintain clinical measurements within normal limits will improve 01/06/2020 1227 by Cristela Blue, RN Outcome: Progressing 01/06/2020 1226 by Cristela Blue, RN Outcome: Progressing Goal: Will remain free from infection 01/06/2020 1227 by Cristela Blue, RN Outcome: Progressing 01/06/2020 1226 by Cristela Blue, RN Outcome: Progressing Goal: Diagnostic test results will improve 01/06/2020 1227 by Cristela Blue, RN Outcome: Progressing 01/06/2020 1226 by Cristela Blue, RN Outcome: Progressing Goal: Respiratory complications will improve 01/06/2020 1227 by Cristela Blue, RN Outcome: Progressing 01/06/2020 1226 by Cristela Blue, RN Outcome: Progressing Goal: Cardiovascular complication will be avoided 01/06/2020 1227 by Cristela Blue, RN Outcome: Progressing 01/06/2020 1226 by Cristela Blue, RN Outcome: Progressing   Problem: Activity: Goal: Risk for activity intolerance will decrease 01/06/2020 1227 by Cristela Blue, RN Outcome: Progressing 01/06/2020 1226 by Cristela Blue, RN Outcome: Progressing   Problem: Nutrition: Goal: Adequate nutrition will be maintained 01/06/2020 1227 by Cristela Blue, RN Outcome: Progressing 01/06/2020 1226 by Cristela Blue, RN Outcome: Progressing   Problem: Coping: Goal: Level of anxiety will decrease 01/06/2020 1227 by Cristela Blue, RN Outcome:  Progressing 01/06/2020 1226 by Cristela Blue, RN Outcome: Progressing   Problem: Elimination: Goal: Will not experience complications related to bowel motility 01/06/2020 1227 by Cristela Blue, RN Outcome: Progressing 01/06/2020 1226 by Cristela Blue, RN Outcome: Progressing Goal: Will not experience complications related to urinary retention 01/06/2020 1227 by Cristela Blue, RN Outcome: Progressing 01/06/2020 1226 by Cristela Blue, RN Outcome: Progressing   Problem: Pain Managment: Goal: General experience of comfort will improve 01/06/2020 1227 by Cristela Blue, RN Outcome: Progressing 01/06/2020 1226 by Cristela Blue, RN Outcome: Progressing   Problem: Safety: Goal: Ability to remain free from injury will improve 01/06/2020 1227 by Cristela Blue, RN Outcome: Progressing 01/06/2020 1226 by Cristela Blue, RN Outcome: Progressing   Problem: Skin Integrity: Goal: Risk for impaired skin integrity will decrease 01/06/2020 1227 by Cristela Blue, RN Outcome: Progressing 01/06/2020 1226 by Cristela Blue, RN Outcome: Progressing   Problem: Education: Goal: Knowledge of risk factors and measures for prevention of condition will improve 01/06/2020 1227 by Cristela Blue, RN Outcome: Progressing 01/06/2020 1226 by Cristela Blue, RN Outcome: Progressing   Problem: Coping: Goal: Psychosocial and spiritual needs will be supported 01/06/2020 1227 by Cristela Blue, RN Outcome: Progressing 01/06/2020 1226 by Cristela Blue, RN Outcome: Progressing   Problem: Respiratory: Goal: Will maintain a patent airway 01/06/2020 1227 by Cristela Blue, RN Outcome: Progressing 01/06/2020 1226 by Cristela Blue, RN Outcome: Progressing   Problem: Education: Goal: Knowledge of disease and its progression will improve 01/06/2020 1227 by Cristela Blue, RN Outcome: Progressing 01/06/2020 1226 by Cristela Blue, RN Outcome: Progressing Goal: Individualized Educational  Video(s) 01/06/2020 1227 by Cristela Blue, RN Outcome: Progressing 01/06/2020 1226 by Cristela Blue, RN Outcome: Progressing   Problem: Fluid Volume: Goal: Compliance with measures to maintain balanced fluid volume will improve 01/06/2020 1227 by Cristela Blue, RN Outcome: Progressing 01/06/2020 1226 by Cristela Blue, RN Outcome: Progressing   Problem: Health Behavior/Discharge Planning: Goal: Ability to manage health-related needs will improve 01/06/2020 1227 by Cristela Blue, RN Outcome: Progressing 01/06/2020 1226 by  Cristela Blue, RN Outcome: Progressing   Problem: Nutritional: Goal: Ability to make healthy dietary choices will improve 01/06/2020 1227 by Cristela Blue, RN Outcome: Progressing 01/06/2020 1226 by Cristela Blue, RN Outcome: Progressing   Problem: Clinical Measurements: Goal: Complications related to the disease process, condition or treatment will be avoided or minimized 01/06/2020 1227 by Cristela Blue, RN Outcome: Progressing 01/06/2020 1226 by Cristela Blue, RN Outcome: Progressing

## 2020-01-06 NOTE — Progress Notes (Signed)
Report given to 1C RN. All outstanding questions resolved.

## 2020-01-06 NOTE — Progress Notes (Signed)
PROGRESS NOTE    Hector [name]  JAS:[id number] DOB: 09/25/1965 DOA: 12/22/2019 PCP: [name], MD   Chief complaint.  Shortness of breath. Brief Narrative:  [name] is a 54 year old male with past medical history of congestive heart failure chronic kidney disease hyperlipidemia paroxysmal atrial fibrillation and alcohol use who presented to the emergency room today with 2 to 3 weeks history of shortness of breath associated with dry cough and generalized weakness. In the emergency room, his oxygen saturation was 70%, he was placed on 100% nonbreather and admitted to the ICU. He has been requiring heated high flow since being there. He was started on IV steroids, completed remdesivir. He was started on baricitinib, his CRP has normalized, baricitinib was discontinued.  II was assigned to this patient on 12/28/2019. Patient still using high flow oxygen.Normal BNP, procalcitonin level less than 0.01.  Due to worsening hypoxemia, baricitinib was restarted on 11/6.   11/8.  Patient condition finally improved, on 4 L oxygen.  Anticipating discharge in 2 or 3 days.   Assessment & Plan:   Principal Problem:   Pneumonia due to COVID-19 virus Active Problems:   Hypertensive heart disease   Hyperlipidemia   Chronic kidney disease (CKD), stage III (moderate) (HCC)   Paroxysmal atrial fibrillation (HCC)   Chronic diastolic heart failure (HCC)   Essential hypertension   Nonischemic cardiomyopathy (HCC)   Chronic systolic heart failure (HCC)   Acute respiratory failure with hypoxemia (HCC)   Severe sepsis ([place])   COVID-19   Morbidly obese (HCC)   Acute respiratory distress syndrome (ARDS) due to COVID-19 virus (HCC)   Acute renal failure superimposed on stage 3b chronic kidney disease (HCC)   Hyperkalemia   Hyponatremia  #1.  Acute hypoxemic respiratory failure, ARDS, COVID-19 pneumonia. --Today, Pt was weaned down to RA at rest, however, when sitting  up at side of bed, pt became hypoxic and needed 2L O2.   --completed steroids and remdesivir. PLAN: --cont baricitinib while inpatient --Continue supplemental O2 to keep sats >=92%, wean as tolerated  #2 Acute kidney injury on chronic kidney disease stage IIIb metabolic acidosis, resolved --Nephrology consulted --cont to hold lasix, Entresto, per nephrology  hyperkalemia  --cont Lokelma  3.  Chronic combined systolic and diastolic congestive heart failure. Stable clinically --cont to hold lasix, Entresto, per nephrology due to AKI  4.  Severe sepsis. Resolved  5.  Paroxysmal atrial fibrillation. Acquired thrombophilia  Continue Eliquis. --cont admiodraone and coreg  DM2 --A1c 7.7 --mealtime 3u TID --SSI  Hypothyroidism --cont Synthroid  HLD --cont Lipitor 80 mg daily   DVT prophylaxis: Eliquis Code Status: Full Family Communication: None Disposition Plan:   Status is: Inpatient  Remains inpatient appropriate because:Inpatient level of care appropriate due to severity of illness   Dispo: The patient is from: Home              Anticipated d/c is to: SNF              Anticipated d/c date is: whenever bed available              Patient currently is medically stable to d/c.    I/O last 3 completed shifts: In: 360 [P.O.:360] Out: 3325 [Urine:3325] No intake/output data recorded.     Consultants:   None  Procedures: None  Antimicrobials: None  Subjective: No new complaints today.  Still need 2L O2.   Objective: Vitals:   01/06/20 0500 01/06/20 0845 01/06/20 1000 01/06/20 1243  BP: 136/78 (!) 135/92 (!) 147/97 (!) 149/99  Pulse: 70 84 81 78  Resp: 18 18 19 18   Temp: 98.4 F (36.9 C) 98.6 F (37 C) 98.5 F (36.9 C) 98.1 F (36.7 C)  TempSrc: Oral Oral Oral Oral  SpO2: 97% 96% 96% 97%  Weight: 118.7 kg     Height:        Intake/Output Summary (Last 24 hours) at 01/06/2020 1312 Last data filed at 01/06/2020 0556 Gross per 24 hour   Intake --  Output 1325 ml  Net -1325 ml   Filed Weights   01/03/20 0500 01/05/20 0505 01/06/20 0500  Weight: 122.3 kg 119.7 kg 118.7 kg    Examination:  Constitutional: NAD, sleeping but arousable HEENT: conjunctivae and lids normal, EOMI CV: No cyanosis.   RESP: normal respiratory effort, on 2L Extremities: No effusions, edema in BLE.  Left fingers contracted. SKIN: warm, dry and intact Neuro: II - XII grossly intact.      Data Reviewed: I have personally reviewed following labs and imaging studies  CBC: Recent Labs  Lab 12/31/19 0415 01/02/20 0413 01/03/20 0606 01/04/20 0534 01/06/20 0650  WBC 19.8* 23.0* 19.4* 15.0* 10.5  NEUTROABS 15.3* 19.0* 16.0*  --   --   HGB 15.2 16.1 15.9 15.8 14.7  HCT 45.8 47.5 47.4 47.7 44.7  MCV 85.9 85.9 87.0 87.2 86.6  PLT 413* 370 280 258 353   Basic Metabolic Panel: Recent Labs  Lab 12/31/19 0415 01/02/20 0413 01/03/20 0606 01/04/20 0534 01/06/20 0650  NA 133* 134* 131* 135 132*  K 5.0 5.1 5.0 4.6 4.5  CL 101 99 98 101 100  CO2 22 24 23 23 23   GLUCOSE 116* 94 92 93 105*  BUN 87* 87* 80* 69* 56*  CREATININE 3.02* 2.58* 2.33* 2.21* 2.30*  CALCIUM 8.6* 8.7* 8.5* 8.4* 8.6*  MG 2.3 2.4 2.2 2.1 2.0   GFR: Estimated Creatinine Clearance: 47.6 mL/min (A) (by C-G formula based on SCr of 2.3 mg/dL (H)). Liver Function Tests: No results for input(s): AST, ALT, ALKPHOS, BILITOT, PROT, ALBUMIN in the last 168 hours. No results for input(s): LIPASE, AMYLASE in the last 168 hours. No results for input(s): AMMONIA in the last 168 hours. Coagulation Profile: No results for input(s): INR, PROTIME in the last 168 hours. Cardiac Enzymes: No results for input(s): CKTOTAL, CKMB, CKMBINDEX, TROPONINI in the last 168 hours. BNP (last 3 results) No results for input(s): PROBNP in the last 8760 hours. HbA1C: No results for input(s): HGBA1C in the last 72 hours. CBG: Recent Labs  Lab 01/05/20 1201 01/05/20 1645 01/05/20 2101  01/06/20 0842 01/06/20 1238  GLUCAP 119* 132* 74 146* 84   Lipid Profile: No results for input(s): CHOL, HDL, LDLCALC, TRIG, CHOLHDL, LDLDIRECT in the last 72 hours. Thyroid Function Tests: No results for input(s): TSH, T4TOTAL, FREET4, T3FREE, THYROIDAB in the last 72 hours. Anemia Panel: No results for input(s): VITAMINB12, FOLATE, FERRITIN, TIBC, IRON, RETICCTPCT in the last 72 hours. Sepsis Labs: No results for input(s): PROCALCITON, LATICACIDVEN in the last 168 hours.  No results found for this or any previous visit (from the past 240 hour(s)).       Radiology Studies: No results found.      Scheduled Meds: . allopurinol  50 mg Oral Daily  . amiodarone  200 mg Oral Daily  . amLODipine  10 mg Oral Daily  . apixaban  5 mg Oral BID  . atorvastatin  80 mg Oral QHS  . baricitinib  2 mg Oral Daily  . carvedilol  3.125 mg Oral BID WC  . feeding supplement (NEPRO CARB STEADY)  237 mL Oral BID BM  . insulin aspart  0-20 Units Subcutaneous TID AC & HS  . insulin aspart  3 Units Subcutaneous TID with meals  . levothyroxine  100 mcg Oral QAC breakfast  . sodium zirconium cyclosilicate  10 g Oral Daily  . traZODone  50 mg Oral QHS   Continuous Infusions:   LOS: 15 days    Enzo Bi, MD [facility]   To contact the attending provider between 7A-7P or the covering provider during after hours 7P-7A, please log into the web site [email or web address] and access using universal [place] password for that web site. If you do not have the password, please call the hospital operator.  01/06/2020, 1:12 PM

## 2020-01-07 ENCOUNTER — Inpatient Hospital Stay: Payer: Medicaid Other

## 2020-01-07 DIAGNOSIS — U071 COVID-19: Secondary | ICD-10-CM | POA: Diagnosis not present

## 2020-01-07 DIAGNOSIS — J1282 Pneumonia due to coronavirus disease 2019: Secondary | ICD-10-CM | POA: Diagnosis not present

## 2020-01-07 LAB — GLUCOSE, CAPILLARY
Glucose-Capillary: 104 mg/dL — ABNORMAL HIGH (ref 70–99)
Glucose-Capillary: 118 mg/dL — ABNORMAL HIGH (ref 70–99)
Glucose-Capillary: 116 mg/dL — ABNORMAL HIGH (ref 70–99)
Glucose-Capillary: 100 mg/dL — ABNORMAL HIGH (ref 70–99)

## 2020-01-07 IMAGING — US US EXTREM  UP VENOUS*L*
1 series · 13 of 24 positions shown · non-contrast
Comparison: None.

CLINICAL DATA: Pain left arm x2 weeks



[Series 1: us venous img upper uni left (dvt) · portal-venous · 13 of 36 slices shown]
[im 1/36]
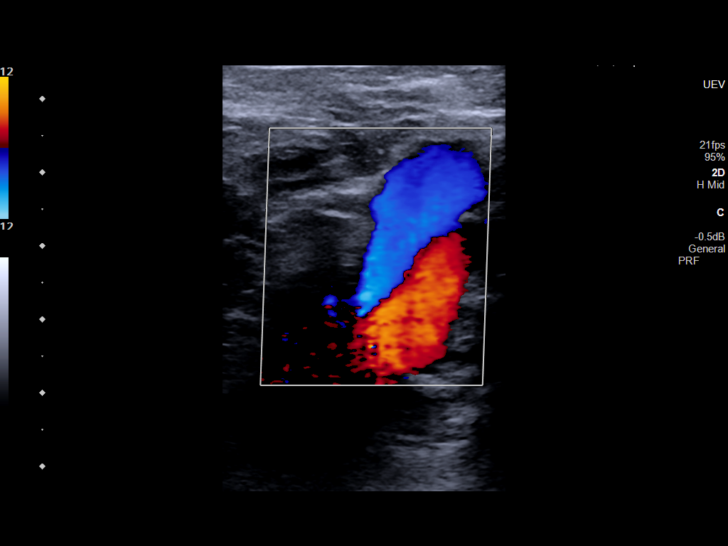
[im 4/36]
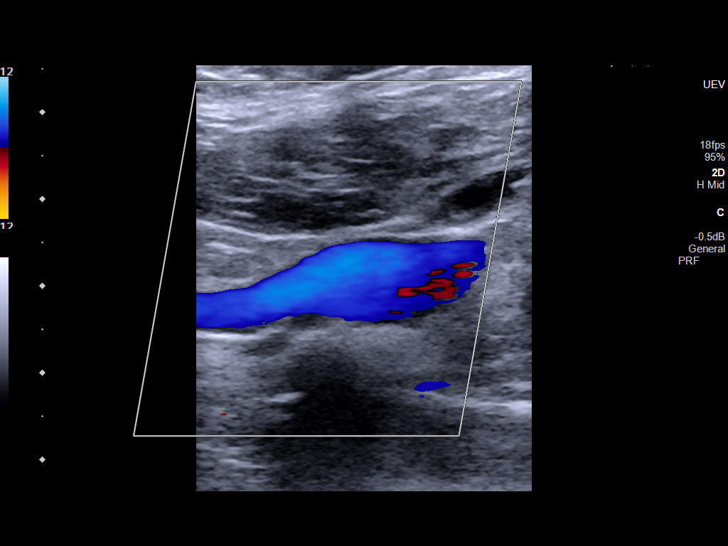
[im 7/36]
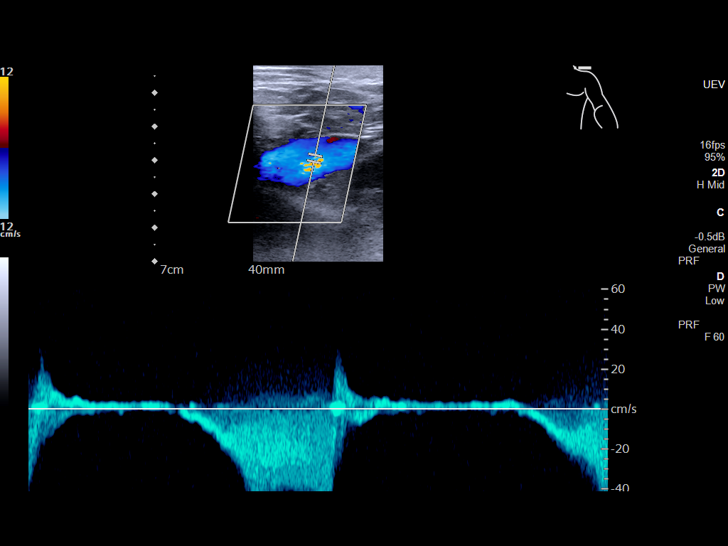
[im 10/36]
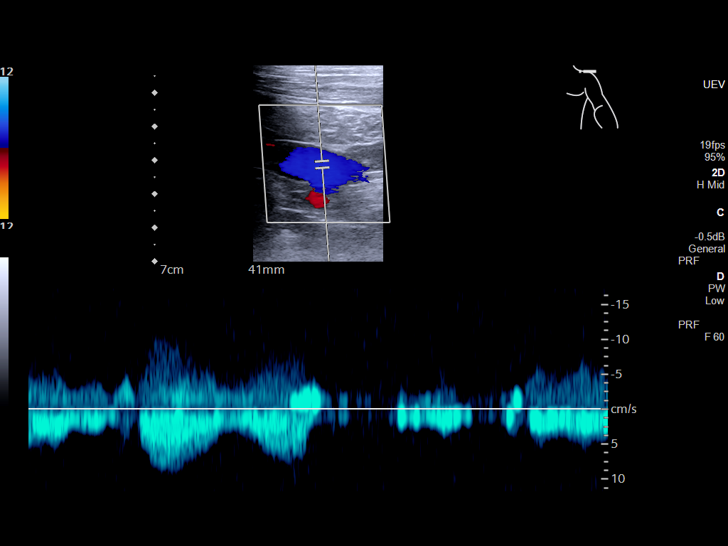
[im 13/36]
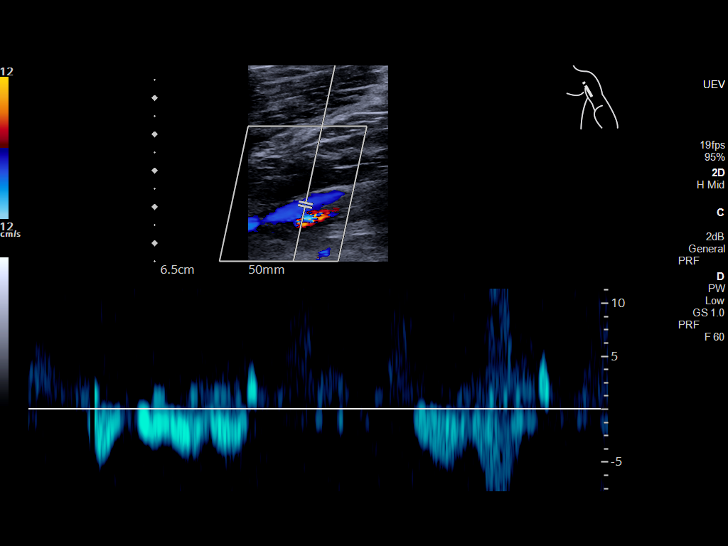
[im 16/36]
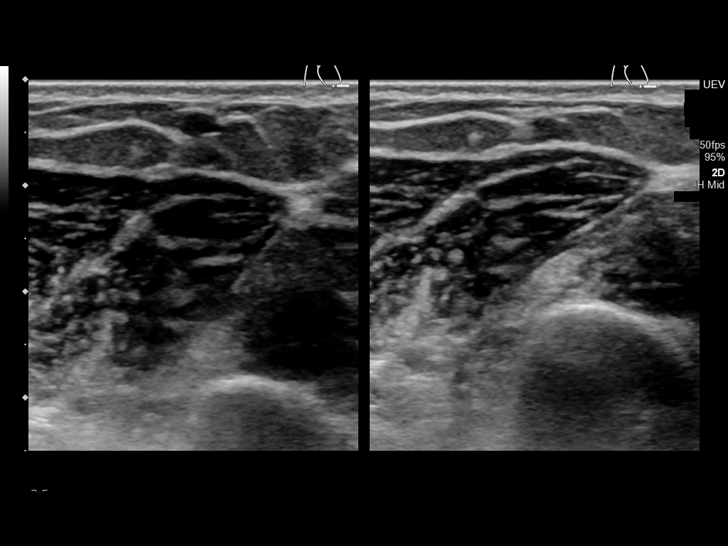
[im 19/36]
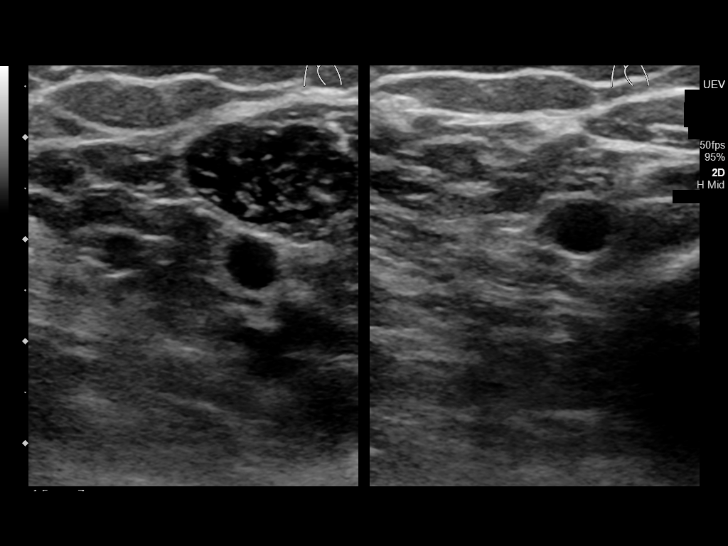
[im 20/36]
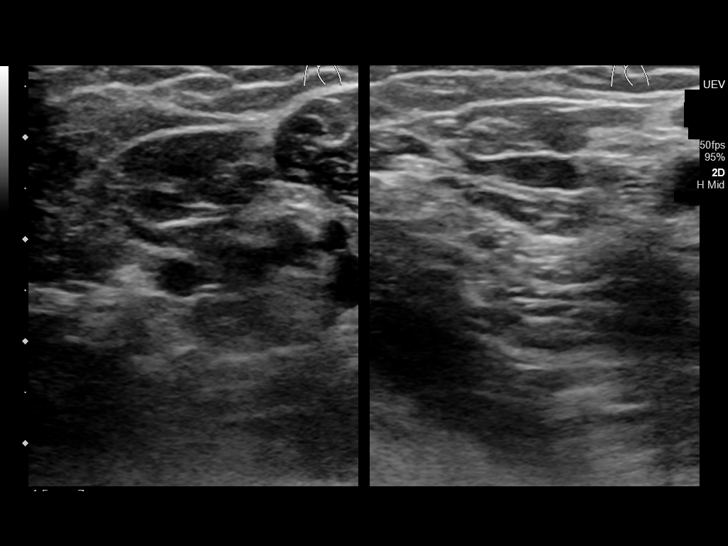
[im 23/36]
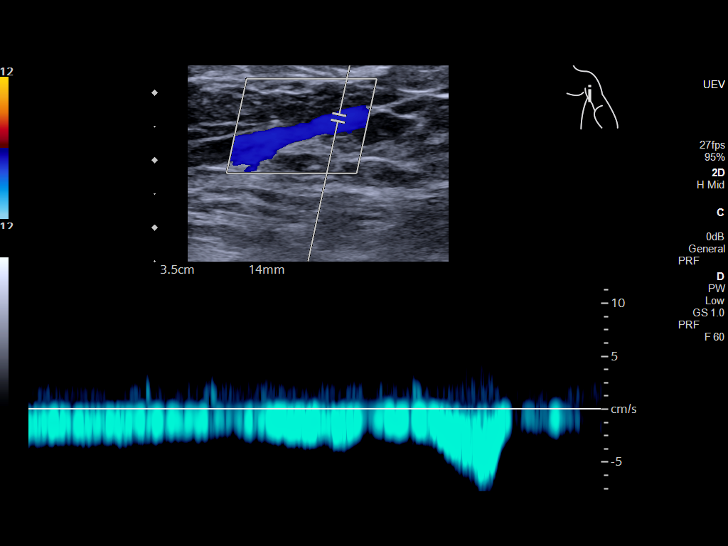
[im 26/36]
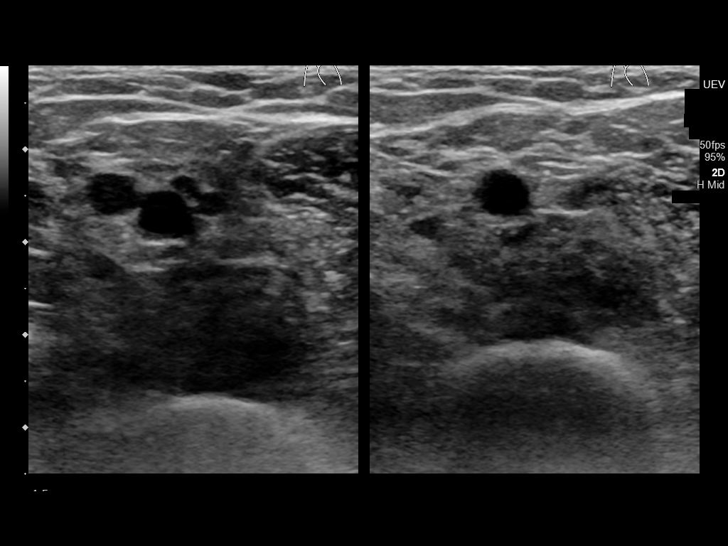
[im 29/36]
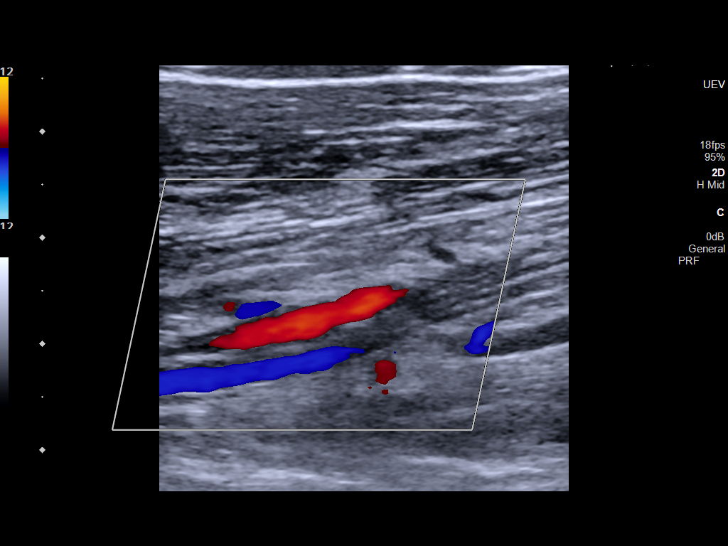
[im 32/36]
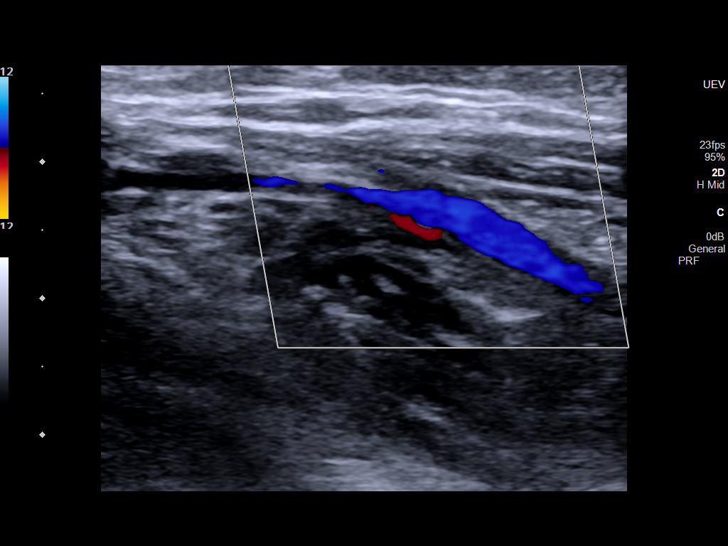
[im 36/36]
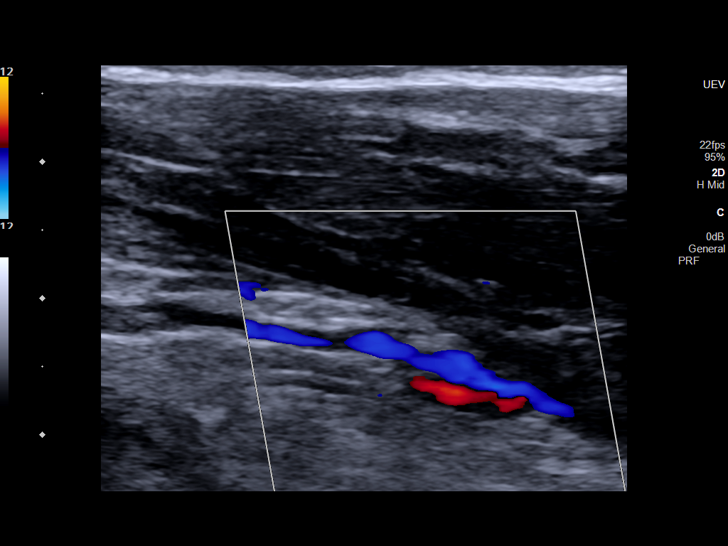

[13 of 24 positions shown; findings below may reference images not displayed]

FINDINGS: Contralateral Subclavian Vein: Respiratory phasicity is normal and
symmetric with the symptomatic side. No evidence of thrombus. Normal
compressibility.

Internal Jugular Vein: No evidence of thrombus. Normal
compressibility, respiratory phasicity and response to augmentation.

Subclavian Vein: No evidence of thrombus. Normal compressibility,
respiratory phasicity and response to augmentation.

Axillary Vein: No evidence of thrombus. Normal compressibility,
respiratory phasicity and response to augmentation.

Cephalic Vein: No evidence of thrombus. Normal compressibility,
respiratory phasicity and response to augmentation.

Basilic Vein: No evidence of thrombus. Normal compressibility,
respiratory phasicity and response to augmentation.

Brachial Veins: No evidence of thrombus. Normal compressibility,
respiratory phasicity and response to augmentation.

Radial Veins: No evidence of thrombus. Normal compressibility,
respiratory phasicity and response to augmentation.

Ulnar Veins: No evidence of thrombus. Normal compressibility,
respiratory phasicity and response to augmentation.

Other Findings:  None visualized.
IMPRESSION: No evidence of DVT within the left upper extremity.

## 2020-01-07 NOTE — Progress Notes (Signed)
Patient did not eat enough for lunch and dinner to get insulin, patient does drink the protein shakes, nepro

## 2020-01-07 NOTE — Progress Notes (Signed)
PROGRESS NOTE    DAIMIAN SUDBERRY  TIR:[id number] DOB: June 16, 1965 DOA: 12/22/2019 PCP: [name], MD   Chief complaint.  Shortness of breath. Brief Narrative:  [name] is a 54 year old male with past medical history of congestive heart failure chronic kidney disease hyperlipidemia paroxysmal atrial fibrillation and alcohol use who presented to the emergency room today with 2 to 3 weeks history of shortness of breath associated with dry cough and generalized weakness. In the emergency room, his oxygen saturation was 70%, he was placed on 100% nonbreather and admitted to the ICU. He has been requiring heated high flow since being there. He was started on IV steroids, completed remdesivir. He was started on baricitinib, his CRP has normalized, baricitinib was discontinued.  II was assigned to this patient on 12/28/2019. Patient still using high flow oxygen.Normal BNP, procalcitonin level less than 0.01.  Due to worsening hypoxemia, baricitinib was restarted on 11/6.   11/8.  Patient condition finally improved, on 4 L oxygen.  Anticipating discharge in 2 or 3 days.   Assessment & Plan:   Principal Problem:   Pneumonia due to COVID-19 virus Active Problems:   Hypertensive heart disease   Hyperlipidemia   Chronic kidney disease (CKD), stage III (moderate) (HCC)   Paroxysmal atrial fibrillation (HCC)   Chronic diastolic heart failure (HCC)   Essential hypertension   Nonischemic cardiomyopathy (HCC)   Chronic systolic heart failure (HCC)   Acute respiratory failure with hypoxemia (HCC)   Severe sepsis ([place])   COVID-19   Morbidly obese (HCC)   Acute respiratory distress syndrome (ARDS) due to COVID-19 virus (HCC)   Acute renal failure superimposed on stage 3b chronic kidney disease (HCC)   Hyperkalemia   Hyponatremia  #1.  Acute hypoxemic respiratory failure, ARDS, COVID-19 pneumonia. --Today, Pt was weaned down to RA at rest, however, when sitting  up at side of bed, pt became hypoxic and needed 2L O2.   --completed steroids and remdesivir. PLAN: --cont baricitinib while inpatient --Continue supplemental O2 to keep sats >=92%, wean as tolerated  #2 Acute kidney injury on chronic kidney disease stage IIIb metabolic acidosis, resolved --Nephrology consulted --cont to hold lasix, Entresto, per nephrology  hyperkalemia  --cont Lokelma  3.  Chronic combined systolic and diastolic congestive heart failure. Stable clinically --cont to hold lasix and Entresto, per nephrology  4.  Severe sepsis. Resolved  5.  Paroxysmal atrial fibrillation. Acquired thrombophilia  Continue Eliquis. --cont admiodraone and coreg  DM2 --A1c 7.7 --cont mealtime 3u TID --SSI  Hypothyroidism --cont Synthroid  HLD --cont Lipitor 80 mg daily  Left UE pain and numbness --likely 2/2 ulnar nerve irritation.  PLAN: --[place] LUE to rule out DVT --rolled towel to keep his left hand open   DVT prophylaxis: Eliquis Code Status: Full Family Communication: None Disposition Plan:   Status is: Inpatient  Remains inpatient appropriate because:Inpatient level of care appropriate due to severity of illness   Dispo: The patient is from: Home              Anticipated d/c is to: SNF              Anticipated d/c date is: whenever bed available              Patient currently is medically stable to d/c.    I/O last 3 completed shifts: In: 840 [P.O.:840] Out: 2325 [Urine:2325] No intake/output data recorded.     Consultants:   None  Procedures: None  Antimicrobials: None  Subjective: Yelled out loudly with any slight touch of his body.  Continued to complain of left forearm swelling and pain, and difficulty opening his left hand.   Objective: Vitals:   01/07/20 0500 01/07/20 0658 01/07/20 0819 01/07/20 1230  BP:  (!) 151/99 (!) 157/105 123/89  Pulse:  72 92 79  Resp:  16 20 18   Temp:  98.6 F (37 C) 98.4 F (36.9 C) 98.1 F (36.7  C)  TempSrc:  Oral Oral Oral  SpO2:  95% 96% 96%  Weight: 119.3 kg     Height:        Intake/Output Summary (Last 24 hours) at 01/07/2020 1450 Last data filed at 01/07/2020 0659 Gross per 24 hour  Intake 360 ml  Output 1000 ml  Net -640 ml   Filed Weights   01/05/20 0505 01/06/20 0500 01/07/20 0500  Weight: 119.7 kg 118.7 kg 119.3 kg    Examination:  Constitutional: NAD, AAOx3 HEENT: conjunctivae and lids normal, EOMI CV: No cyanosis.   RESP: normal respiratory effort, on 2L Extremities: No effusions, edema in BLE.  Left hand fingers in flexed position. SKIN: warm, dry and intact Neuro: II - XII grossly intact.      Data Reviewed: I have personally reviewed following labs and imaging studies  CBC: Recent Labs  Lab 01/02/20 0413 01/03/20 0606 01/04/20 0534 01/06/20 0650  WBC 23.0* 19.4* 15.0* 10.5  NEUTROABS 19.0* 16.0*  --   --   HGB 16.1 15.9 15.8 14.7  HCT 47.5 47.4 47.7 44.7  MCV 85.9 87.0 87.2 86.6  PLT 370 280 258 275   Basic Metabolic Panel: Recent Labs  Lab 01/02/20 0413 01/03/20 0606 01/04/20 0534 01/06/20 0650  NA 134* 131* 135 132*  K 5.1 5.0 4.6 4.5  CL 99 98 101 100  CO2 24 23 23 23   GLUCOSE 94 92 93 105*  BUN 87* 80* 69* 56*  CREATININE 2.58* 2.33* 2.21* 2.30*  CALCIUM 8.7* 8.5* 8.4* 8.6*  MG 2.4 2.2 2.1 2.0   GFR: Estimated Creatinine Clearance: 47.7 mL/min (A) (by C-G formula based on SCr of 2.3 mg/dL (H)). Liver Function Tests: No results for input(s): AST, ALT, ALKPHOS, BILITOT, PROT, ALBUMIN in the last 168 hours. No results for input(s): LIPASE, AMYLASE in the last 168 hours. No results for input(s): AMMONIA in the last 168 hours. Coagulation Profile: No results for input(s): INR, PROTIME in the last 168 hours. Cardiac Enzymes: No results for input(s): CKTOTAL, CKMB, CKMBINDEX, TROPONINI in the last 168 hours. BNP (last 3 results) No results for input(s): PROBNP in the last 8760 hours. HbA1C: No results for input(s):  HGBA1C in the last 72 hours. CBG: Recent Labs  Lab 01/06/20 1238 01/06/20 1712 01/06/20 2134 01/07/20 0818 01/07/20 1242  GLUCAP 84 102* 116* 104* 118*   Lipid Profile: No results for input(s): CHOL, HDL, LDLCALC, TRIG, CHOLHDL, LDLDIRECT in the last 72 hours. Thyroid Function Tests: No results for input(s): TSH, T4TOTAL, FREET4, T3FREE, THYROIDAB in the last 72 hours. Anemia Panel: No results for input(s): VITAMINB12, FOLATE, FERRITIN, TIBC, IRON, RETICCTPCT in the last 72 hours. Sepsis Labs: No results for input(s): PROCALCITON, LATICACIDVEN in the last 168 hours.  No results found for this or any previous visit (from the past 240 hour(s)).       Radiology Studies: No results found.      Scheduled Meds: . allopurinol  50 mg Oral Daily  . amiodarone  200 mg Oral Daily  . amLODipine  10 mg Oral  Daily  . apixaban  5 mg Oral BID  . atorvastatin  80 mg Oral QHS  . baricitinib  2 mg Oral Daily  . carvedilol  3.125 mg Oral BID WC  . feeding supplement (NEPRO CARB STEADY)  237 mL Oral BID BM  . insulin aspart  0-20 Units Subcutaneous TID AC & HS  . insulin aspart  3 Units Subcutaneous TID with meals  . levothyroxine  100 mcg Oral QAC breakfast  . sodium zirconium cyclosilicate  10 g Oral Daily  . traZODone  50 mg Oral QHS   Continuous Infusions:   LOS: 16 days    Enzo Bi, MD [facility]   To contact the attending provider between 7A-7P or the covering provider during after hours 7P-7A, please log into the web site [email or web address] and access using universal [place] password for that web site. If you do not have the password, please call the hospital operator.  01/07/2020, 2:50 PM

## 2020-01-08 DIAGNOSIS — U071 COVID-19: Secondary | ICD-10-CM | POA: Diagnosis not present

## 2020-01-08 DIAGNOSIS — J1282 Pneumonia due to coronavirus disease 2019: Secondary | ICD-10-CM | POA: Diagnosis not present

## 2020-01-08 LAB — BASIC METABOLIC PANEL
Anion gap: 7 (ref 5–15)
BUN: 50 mg/dL — ABNORMAL HIGH (ref 6–20)
CO2: 24 mmol/L (ref 22–32)
Calcium: 8.7 mg/dL — ABNORMAL LOW (ref 8.9–10.3)
Chloride: 102 mmol/L (ref 98–111)
Creatinine, Ser: 2.35 mg/dL — ABNORMAL HIGH (ref 0.61–1.24)
GFR, Estimated: 32 mL/min — ABNORMAL LOW (ref >60)
Glucose, Bld: 106 mg/dL — ABNORMAL HIGH (ref 70–99)
Potassium: 4.6 mmol/L (ref 3.5–5.1)
Sodium: 133 mmol/L — ABNORMAL LOW (ref 135–145)

## 2020-01-08 LAB — GLUCOSE, CAPILLARY
Glucose-Capillary: 122 mg/dL — ABNORMAL HIGH (ref 70–99)
Glucose-Capillary: 154 mg/dL — ABNORMAL HIGH (ref 70–99)
Glucose-Capillary: 124 mg/dL — ABNORMAL HIGH (ref 70–99)
Glucose-Capillary: 80 mg/dL (ref 70–99)

## 2020-01-08 MED ORDER — DICLOFENAC SODIUM 1 % EX GEL
2.0000 g | Freq: Two times a day (BID) | CUTANEOUS | Status: DC
  Administered 2020-01-08 – 2020-01-18 (×22): 2 g via TOPICAL
  Filled 2020-01-08: qty 100

## 2020-01-08 NOTE — Progress Notes (Signed)
PROGRESS NOTE    Hector [name]  NWG:[id number] DOB: 04-18-1965 DOA: 12/22/2019 PCP: [name], MD   Chief complaint.  Shortness of breath. Brief Narrative:  [name] is a 54 year old male with past medical history of congestive heart failure chronic kidney disease hyperlipidemia paroxysmal atrial fibrillation and alcohol use who presented to the emergency room today with 2 to 3 weeks history of shortness of breath associated with dry cough and generalized weakness. In the emergency room, his oxygen saturation was 70%, he was placed on 100% nonbreather and admitted to the ICU. He has been requiring heated high flow since being there. He was started on IV steroids, completed remdesivir. He was started on baricitinib, his CRP has normalized, baricitinib was discontinued.  II was assigned to this patient on 12/28/2019. Patient still using high flow oxygen.Normal BNP, procalcitonin level less than 0.01.  Due to worsening hypoxemia, baricitinib was restarted on 11/6.   11/8.  Patient condition finally improved, on 4 L oxygen.  Anticipating discharge in 2 or 3 days.   Assessment & Plan:   Principal Problem:   Pneumonia due to COVID-19 virus Active Problems:   Hypertensive heart disease   Hyperlipidemia   Chronic kidney disease (CKD), stage III (moderate) (HCC)   Paroxysmal atrial fibrillation (HCC)   Chronic diastolic heart failure (HCC)   Essential hypertension   Nonischemic cardiomyopathy (HCC)   Chronic systolic heart failure (HCC)   Acute respiratory failure with hypoxemia (HCC)   Severe sepsis ([place])   COVID-19   Morbidly obese (HCC)   Acute respiratory distress syndrome (ARDS) due to COVID-19 virus (HCC)   Acute renal failure superimposed on stage 3b chronic kidney disease (HCC)   Hyperkalemia   Hyponatremia  #1.  Acute hypoxemic respiratory failure, ARDS, COVID-19 pneumonia. --Today, Pt was weaned down to RA at rest, however, when sitting  up at side of bed, pt became hypoxic and needed 2L O2.   --completed steroids and remdesivir. PLAN: --cont baricitinib while inpatient --Continue supplemental O2 to keep sats >=92%, wean as tolerated  #2 Acute kidney injury on chronic kidney disease stage IIIb metabolic acidosis, resolved --Nephrology consulted --cont to hold lasix, Entresto, per nephrology  hyperkalemia  --cont Lokelma  3.  Chronic combined systolic and diastolic congestive heart failure. Stable clinically --cont to hold lasix and Entresto, per nephrology  4.  Severe sepsis. Resolved  5.  Paroxysmal atrial fibrillation. Acquired thrombophilia  Continue Eliquis. --cont admiodraone and coreg  DM2 --A1c 7.7 --cont mealtime 3u TID --SSI  Hypothyroidism --cont Synthroid  HLD --cont Lipitor 80 mg daily  Left UE pain and numbness --likely 2/2 ulnar nerve irritation.  --[place] LUE to rule out DVT PLAN: --OT --rolled towel to keep his left hand open --Voltaren gel BID   DVT prophylaxis: Eliquis Code Status: Full Family Communication: None Disposition Plan:   Status is: Inpatient  Remains inpatient appropriate because:Inpatient level of care appropriate due to severity of illness   Dispo: The patient is from: Home              Anticipated d/c is to: SNF              Anticipated d/c date is: whenever bed available              Patient currently is medically stable to d/c.    I/O last 3 completed shifts: In: -  Out: 2200 [Urine:2200] No intake/output data recorded.     Consultants:   None  Procedures:  None  Antimicrobials: None  Subjective: Pt again yelled out loudly with any touching of him or his blanket.  Complained his left hand was numb but painful.     Objective: Vitals:   01/08/20 0004 01/08/20 0442 01/08/20 0817 01/08/20 1222  BP: 121/87 126/88 128/81 125/85  Pulse: 77 73 72 72  Resp: 20 18 (!) 21 18  Temp: 98.1 F (36.7 C) 98.1 F (36.7 C) 99 F (37.2 C) 98.4 F  (36.9 C)  TempSrc: Oral Oral Oral Oral  SpO2: 96% 96% 96% 98%  Weight:  119.1 kg    Height:        Intake/Output Summary (Last 24 hours) at 01/08/2020 1553 Last data filed at 01/07/2020 1620 Gross per 24 hour  Intake --  Output 1200 ml  Net -1200 ml   Filed Weights   01/06/20 0500 01/07/20 0500 01/08/20 0442  Weight: 118.7 kg 119.3 kg 119.1 kg    Examination:  Constitutional: NAD, AAOx3 HEENT: conjunctivae and lids normal, EOMI CV: No cyanosis.   RESP: normal respiratory effort, on 2L Extremities: No effusions, edema in BLE.  Left hand contracted, rolled towel held in left hand. SKIN: warm, dry and intact Neuro: II - XII grossly intact.   Psych: irritable mood and affect.     Data Reviewed: I have personally reviewed following labs and imaging studies  CBC: Recent Labs  Lab 01/02/20 0413 01/03/20 0606 01/04/20 0534 01/06/20 0650  WBC 23.0* 19.4* 15.0* 10.5  NEUTROABS 19.0* 16.0*  --   --   HGB 16.1 15.9 15.8 14.7  HCT 47.5 47.4 47.7 44.7  MCV 85.9 87.0 87.2 86.6  PLT 370 280 258 947   Basic Metabolic Panel: Recent Labs  Lab 01/02/20 0413 01/03/20 0606 01/04/20 0534 01/06/20 0650 01/08/20 0751  NA 134* 131* 135 132* 133*  K 5.1 5.0 4.6 4.5 4.6  CL 99 98 101 100 102  CO2 24 23 23 23 24   GLUCOSE 94 92 93 105* 106*  BUN 87* 80* 69* 56* 50*  CREATININE 2.58* 2.33* 2.21* 2.30* 2.35*  CALCIUM 8.7* 8.5* 8.4* 8.6* 8.7*  MG 2.4 2.2 2.1 2.0  --    GFR: Estimated Creatinine Clearance: 46.6 mL/min (A) (by C-G formula based on SCr of 2.35 mg/dL (H)). Liver Function Tests: No results for input(s): AST, ALT, ALKPHOS, BILITOT, PROT, ALBUMIN in the last 168 hours. No results for input(s): LIPASE, AMYLASE in the last 168 hours. No results for input(s): AMMONIA in the last 168 hours. Coagulation Profile: No results for input(s): INR, PROTIME in the last 168 hours. Cardiac Enzymes: No results for input(s): CKTOTAL, CKMB, CKMBINDEX, TROPONINI in the last 168  hours. BNP (last 3 results) No results for input(s): PROBNP in the last 8760 hours. HbA1C: No results for input(s): HGBA1C in the last 72 hours. CBG: Recent Labs  Lab 01/07/20 1242 01/07/20 1546 01/07/20 2125 01/08/20 0816 01/08/20 1224  GLUCAP 118* 116* 100* 122* 154*   Lipid Profile: No results for input(s): CHOL, HDL, LDLCALC, TRIG, CHOLHDL, LDLDIRECT in the last 72 hours. Thyroid Function Tests: No results for input(s): TSH, T4TOTAL, FREET4, T3FREE, THYROIDAB in the last 72 hours. Anemia Panel: No results for input(s): VITAMINB12, FOLATE, FERRITIN, TIBC, IRON, RETICCTPCT in the last 72 hours. Sepsis Labs: No results for input(s): PROCALCITON, LATICACIDVEN in the last 168 hours.  No results found for this or any previous visit (from the past 240 hour(s)).       Radiology Studies: US Venous Img Upper Uni Left (  DVT)  Result Date: 01/07/2020 CLINICAL DATA:  Pain left arm x2 weeks EXAM: LEFT UPPER EXTREMITY VENOUS DOPPLER ULTRASOUND TECHNIQUE: Gray-scale sonography with graded compression, as well as color Doppler and duplex ultrasound were performed to evaluate the upper extremity deep venous system from the level of the subclavian vein and including the jugular, axillary, basilic, radial, ulnar and upper cephalic vein. Spectral Doppler was utilized to evaluate flow at rest and with distal augmentation maneuvers. COMPARISON:  None. FINDINGS: Contralateral Subclavian Vein: Respiratory phasicity is normal and symmetric with the symptomatic side. No evidence of thrombus. Normal compressibility. Internal Jugular Vein: No evidence of thrombus. Normal compressibility, respiratory phasicity and response to augmentation. Subclavian Vein: No evidence of thrombus. Normal compressibility, respiratory phasicity and response to augmentation. Axillary Vein: No evidence of thrombus. Normal compressibility, respiratory phasicity and response to augmentation. Cephalic Vein: No evidence of thrombus.  Normal compressibility, respiratory phasicity and response to augmentation. Basilic Vein: No evidence of thrombus. Normal compressibility, respiratory phasicity and response to augmentation. Brachial Veins: No evidence of thrombus. Normal compressibility, respiratory phasicity and response to augmentation. Radial Veins: No evidence of thrombus. Normal compressibility, respiratory phasicity and response to augmentation. Ulnar Veins: No evidence of thrombus. Normal compressibility, respiratory phasicity and response to augmentation. Other Findings:  None visualized. IMPRESSION: No evidence of DVT within the left upper extremity. Electronically Signed   By: Constance Holster M.D.   On: 01/07/2020 18:44        Scheduled Meds: . allopurinol  50 mg Oral Daily  . amiodarone  200 mg Oral Daily  . amLODipine  10 mg Oral Daily  . apixaban  5 mg Oral BID  . atorvastatin  80 mg Oral QHS  . carvedilol  3.125 mg Oral BID WC  . diclofenac Sodium  2 g Topical BID  . feeding supplement (NEPRO CARB STEADY)  237 mL Oral BID BM  . insulin aspart  0-20 Units Subcutaneous TID AC & HS  . insulin aspart  3 Units Subcutaneous TID with meals  . levothyroxine  100 mcg Oral QAC breakfast  . sodium zirconium cyclosilicate  10 g Oral Daily  . traZODone  50 mg Oral QHS   Continuous Infusions:   LOS: 17 days    Enzo Bi, MD [facility]   To contact the attending provider between 7A-7P or the covering provider during after hours 7P-7A, please log into the web site [email or web address] and access using universal [place] password for that web site. If you do not have the password, please call the hospital operator.  01/08/2020, 3:53 PM

## 2020-01-08 NOTE — TOC Progression Note (Signed)
Transition of Care [facility]) - Progression Note    Patient Details  Name: [name] MRN: [id number] Date of Birth: 1966-02-11  [facility]) CM/SW [place], LCSW Phone Number: 01/08/2020, 12:45 PM  Clinical Narrative:   Patient has 2 bed offers for SNF rehab: [facility] and [facility] in [place]. CSW called and presented bed offers to patient. Patient denied having a preference. CSW reached out to [facility] and [facility] to see when they have a bed available.   Spoke to Receptionist at [facility] who reported she is not sure who their Admissions Worker is today, took a message and will have someone call this CSW back about bed availability.   Called [facility]. She reported she will check on bed availability then call CSW back.   Expected Discharge Plan: [place] Barriers to Discharge: Continued Medical Work up  Expected Discharge Plan and Services Expected Discharge Plan: [place] In-house Referral: NA   Post Acute Care Choice: [place] arrangements for the past 2 months: Single Family Home Expected Discharge Date: 01/03/20               DME Arranged: Oxygen DME Agency: Other - Comment [name]) Date DME Agency Contacted: 01/03/20 Time DME Agency Contacted: [phone] Representative spoke with at DME Agency: [place] ([place]) Interventions    Readmission Risk Interventions Readmission Risk Prevention Plan 01/02/2020  Transportation Screening Complete  PCP or Specialist Appt within 3-5 Days Complete  HRI or [name] Complete  Social Work Consult for [place] Planning/Counseling Complete  Palliative Care Screening Not Applicable  Medication Review Press photographer) Complete  Some recent data might be hidden

## 2020-01-08 NOTE — Plan of Care (Signed)

## 2020-01-09 DIAGNOSIS — U071 COVID-19: Secondary | ICD-10-CM | POA: Diagnosis not present

## 2020-01-09 DIAGNOSIS — J1282 Pneumonia due to coronavirus disease 2019: Secondary | ICD-10-CM | POA: Diagnosis not present

## 2020-01-09 LAB — GLUCOSE, CAPILLARY
Glucose-Capillary: 90 mg/dL (ref 70–99)
Glucose-Capillary: 96 mg/dL (ref 70–99)
Glucose-Capillary: 106 mg/dL — ABNORMAL HIGH (ref 70–99)
Glucose-Capillary: 98 mg/dL (ref 70–99)

## 2020-01-09 LAB — CBC
HCT: 37.8 % — ABNORMAL LOW (ref 39.0–52.0)
Hemoglobin: 12.7 g/dL — ABNORMAL LOW (ref 13.0–17.0)
MCH: 29.4 pg (ref 26.0–34.0)
MCHC: 33.6 g/dL (ref 30.0–36.0)
MCV: 87.5 fL (ref 80.0–100.0)
Platelets: 152 10*3/uL (ref 150–400)
RBC: 4.32 MIL/uL (ref 4.22–5.81)
RDW: 14 % (ref 11.5–15.5)
WBC: 14.7 10*3/uL — ABNORMAL HIGH (ref 4.0–10.5)
nRBC: 0 % (ref 0.0–0.2)

## 2020-01-09 LAB — BASIC METABOLIC PANEL
Anion gap: 9 (ref 5–15)
BUN: 52 mg/dL — ABNORMAL HIGH (ref 6–20)
CO2: 23 mmol/L (ref 22–32)
Calcium: 8.5 mg/dL — ABNORMAL LOW (ref 8.9–10.3)
Chloride: 98 mmol/L (ref 98–111)
Creatinine, Ser: 2.43 mg/dL — ABNORMAL HIGH (ref 0.61–1.24)
GFR, Estimated: 31 mL/min — ABNORMAL LOW (ref >60)
Glucose, Bld: 100 mg/dL — ABNORMAL HIGH (ref 70–99)
Potassium: 5 mmol/L (ref 3.5–5.1)
Sodium: 130 mmol/L — ABNORMAL LOW (ref 135–145)

## 2020-01-09 LAB — MAGNESIUM: Magnesium: 1.7 mg/dL (ref 1.7–2.4)

## 2020-01-09 NOTE — TOC Progression Note (Addendum)
Transition of Care [facility]) - Progression Note    Patient Details  Name: Hector [name] MRN: [id number] Date of Birth: [birth year]  Transition of Care [facility]) CM/SW [place], LCSW Phone Number: 01/09/2020, 10:52 AM  Clinical Narrative:   No return calls received from [facility] or [place]. CSW attempted calls to SNF again about bed availability.  [name]- called Administrator phone, left a message requesting a return call, informed them patient is medically ready.   [facility]- called Admissions Worker [name], no beds available today. CSW asked if they could take patient tomorrow. She reported to follow up again in the morning as they are taking it "day by day." Will call again in the morning if needed.  CSW extended bed search again.   2:20- Another bed offer from [facility] in [place]. Called and spoke to Admissions Worker Eff[place]. She will provide my number to [facility] their recruiter and have her call CSW back. Informed her he is medically ready once we have a bed.    Expected Discharge Plan: [place] Barriers to Discharge: Continued Medical Work up  Expected Discharge Plan and Services Expected Discharge Plan: [place] In-house Referral: NA   Post Acute Care Choice: [place] arrangements for the past 2 months: Single Family Home Expected Discharge Date: 01/03/20               DME Arranged: Oxygen DME Agency: Other - Comment [name]) Date DME Agency Contacted: 01/03/20 Time DME Agency Contacted: [phone] Representative spoke with at DME Agency: [place] ([place]) Interventions    Readmission Risk Interventions Readmission Risk Prevention Plan 01/02/2020  Transportation Screening Complete  PCP or Specialist Appt within 3-5 Days Complete  HRI or [place] Complete  Social Work Consult for [place] Planning/Counseling Complete   Palliative Care Screening Not Applicable  Medication Review Press photographer) Complete  Some recent data might be hidden

## 2020-01-09 NOTE — Progress Notes (Addendum)
PROGRESS NOTE    Hector [name]  KKX:[id number] DOB: 01/09/1966 DOA: 12/22/2019 PCP: [name], MD   Chief complaint.  Shortness of breath. Brief Narrative:  [name] is a 54 year old male with past medical history of congestive heart failure chronic kidney disease hyperlipidemia paroxysmal atrial fibrillation and alcohol use who presented to the emergency room today with 2 to 3 weeks history of shortness of breath associated with dry cough and generalized weakness. In the emergency room, his oxygen saturation was 70%, he was placed on 100% nonbreather and admitted to the ICU.    Assessment & Plan:   Principal Problem:   Pneumonia due to COVID-19 virus Active Problems:   Hypertensive heart disease   Hyperlipidemia   Chronic kidney disease (CKD), stage III (moderate) (HCC)   Paroxysmal atrial fibrillation (HCC)   Chronic diastolic heart failure (HCC)   Essential hypertension   Nonischemic cardiomyopathy (HCC)   Chronic systolic heart failure (HCC)   Acute respiratory failure with hypoxemia (HCC)   Severe sepsis ([place])   COVID-19   Morbidly obese (HCC)   Acute respiratory distress syndrome (ARDS) due to COVID-19 virus (HCC)   Acute renal failure superimposed on stage 3b chronic kidney disease (HCC)   Hyperkalemia   Hyponatremia   #1.  Acute hypoxemic respiratory failure, ARDS, COVID-19 pneumonia. --still needed 2L intermittently, especially when up. --completed steroids and remdesivir. PLAN: --cont baricitinib while inpatient --Continue supplemental O2 to keep sats >=92%, wean as tolerated  #2 Acute kidney injury on chronic kidney disease stage IIIb metabolic acidosis, resolved --Nephrology consulted, now signed off. --cont to hold lasix, Entresto, per nephrology  hyperkalemia  --cont Lokelma  3.  Chronic combined systolic and diastolic congestive heart failure. Stable clinically --cont to hold lasix and Entresto, per nephrology  4.   Severe sepsis. Resolved  5.  Paroxysmal atrial fibrillation. Acquired thrombophilia  Continue Eliquis. --cont admiodraone and coreg  DM2 --A1c 7.7 --cont mealtime 3u TID --SSI  Hypothyroidism --cont Synthroid  HLD --cont Lipitor 80 mg daily  Left UE pain and numbness --likely 2/2 ulnar nerve irritation or compression.  Having numbness and difficulty extending 4th and 5th fingers. --[place] LUE to rule out DVT PLAN: --OT --rolled towel to keep his left hand open --Voltaren gel BID --Pt encouraged to stretch out his left hand and fingers frequently on his own, but pt was unmotivated and self-limiting.   DVT prophylaxis: Eliquis Code Status: Full Family Communication:  Disposition Plan:   Status is: Inpatient  Remains inpatient appropriate because:Inpatient level of care appropriate due to severity of illness   Dispo: The patient is from: Home              Anticipated d/c is to: SNF              Anticipated d/c date is: whenever bed available              Patient currently is medically stable to d/c.      I/O last 3 completed shifts: In: -  Out: 550 [Urine:550] Total I/O In: -  Out: 500 [Urine:500]     Consultants:   None  Procedures: None  Antimicrobials: None  Subjective: Continued to complain only of left arm and hand pain and numbness.  Pt said he refuses to get up to work with PT as long as his left arm is not right.     Objective: Vitals:   01/09/20 0431 01/09/20 0451 01/09/20 0759 01/09/20 1100  BP: 119/77  126/80 124/81  Pulse: 79  77 76  Resp: 17  (!) 22 20  Temp: 98.3 F (36.8 C)  99.6 F (37.6 C) 99.2 F (37.3 C)  TempSrc:   Oral   SpO2: 95%  96% 97%  Weight:  120.7 kg    Height:        Intake/Output Summary (Last 24 hours) at 01/09/2020 1529 Last data filed at 01/09/2020 0900 Gross per 24 hour  Intake --  Output 1050 ml  Net -1050 ml   Filed Weights   01/07/20 0500 01/08/20 0442 01/09/20 0451  Weight: 119.3 kg 119.1 kg  120.7 kg    Examination:  Constitutional: NAD, AAOx3 HEENT: conjunctivae and lids normal, EOMI CV: No cyanosis.   RESP: normal respiratory effort, on 2L Extremities: No effusions, edema in BLE MSK: Left 4th and 5th fingers flexed, though pt was able to extend them more today.   SKIN: warm, dry and intact Neuro: II - XII grossly intact.   Psych: irritable mood and affect.       Data Reviewed: I have personally reviewed following labs and imaging studies  CBC: Recent Labs  Lab 01/03/20 0606 01/04/20 0534 01/06/20 0650 01/09/20 0742  WBC 19.4* 15.0* 10.5 14.7*  NEUTROABS 16.0*  --   --   --   HGB 15.9 15.8 14.7 12.7*  HCT 47.4 47.7 44.7 37.8*  MCV 87.0 87.2 86.6 87.5  PLT 280 258 200 053   Basic Metabolic Panel: Recent Labs  Lab 01/03/20 0606 01/04/20 0534 01/06/20 0650 01/08/20 0751 01/09/20 0742  NA 131* 135 132* 133* 130*  K 5.0 4.6 4.5 4.6 5.0  CL 98 101 100 102 98  CO2 23 23 23 24 23   GLUCOSE 92 93 105* 106* 100*  BUN 80* 69* 56* 50* 52*  CREATININE 2.33* 2.21* 2.30* 2.35* 2.43*  CALCIUM 8.5* 8.4* 8.6* 8.7* 8.5*  MG 2.2 2.1 2.0  --  1.7   GFR: Estimated Creatinine Clearance: 45.4 mL/min (A) (by C-G formula based on SCr of 2.43 mg/dL (H)). Liver Function Tests: No results for input(s): AST, ALT, ALKPHOS, BILITOT, PROT, ALBUMIN in the last 168 hours. No results for input(s): LIPASE, AMYLASE in the last 168 hours. No results for input(s): AMMONIA in the last 168 hours. Coagulation Profile: No results for input(s): INR, PROTIME in the last 168 hours. Cardiac Enzymes: No results for input(s): CKTOTAL, CKMB, CKMBINDEX, TROPONINI in the last 168 hours. BNP (last 3 results) No results for input(s): PROBNP in the last 8760 hours. HbA1C: No results for input(s): HGBA1C in the last 72 hours. CBG: Recent Labs  Lab 01/08/20 1224 01/08/20 1617 01/08/20 2017 01/09/20 0756 01/09/20 1135  GLUCAP 154* 124* 80 90 96   Lipid Profile: No results for input(s):  CHOL, HDL, LDLCALC, TRIG, CHOLHDL, LDLDIRECT in the last 72 hours. Thyroid Function Tests: No results for input(s): TSH, T4TOTAL, FREET4, T3FREE, THYROIDAB in the last 72 hours. Anemia Panel: No results for input(s): VITAMINB12, FOLATE, FERRITIN, TIBC, IRON, RETICCTPCT in the last 72 hours. Sepsis Labs: No results for input(s): PROCALCITON, LATICACIDVEN in the last 168 hours.  No results found for this or any previous visit (from the past 240 hour(s)).       Radiology Studies: US Venous Img Upper Uni Left (DVT)  Result Date: 01/07/2020 CLINICAL DATA:  Pain left arm x2 weeks EXAM: LEFT UPPER EXTREMITY VENOUS DOPPLER ULTRASOUND TECHNIQUE: Gray-scale sonography with graded compression, as well as color Doppler and duplex ultrasound were performed to evaluate the  upper extremity deep venous system from the level of the subclavian vein and including the jugular, axillary, basilic, radial, ulnar and upper cephalic vein. Spectral Doppler was utilized to evaluate flow at rest and with distal augmentation maneuvers. COMPARISON:  None. FINDINGS: Contralateral Subclavian Vein: Respiratory phasicity is normal and symmetric with the symptomatic side. No evidence of thrombus. Normal compressibility. Internal Jugular Vein: No evidence of thrombus. Normal compressibility, respiratory phasicity and response to augmentation. Subclavian Vein: No evidence of thrombus. Normal compressibility, respiratory phasicity and response to augmentation. Axillary Vein: No evidence of thrombus. Normal compressibility, respiratory phasicity and response to augmentation. Cephalic Vein: No evidence of thrombus. Normal compressibility, respiratory phasicity and response to augmentation. Basilic Vein: No evidence of thrombus. Normal compressibility, respiratory phasicity and response to augmentation. Brachial Veins: No evidence of thrombus. Normal compressibility, respiratory phasicity and response to augmentation. Radial Veins: No  evidence of thrombus. Normal compressibility, respiratory phasicity and response to augmentation. Ulnar Veins: No evidence of thrombus. Normal compressibility, respiratory phasicity and response to augmentation. Other Findings:  None visualized. IMPRESSION: No evidence of DVT within the left upper extremity. Electronically Signed   By: Constance Holster M.D.   On: 01/07/2020 18:44        Scheduled Meds: . allopurinol  50 mg Oral Daily  . amiodarone  200 mg Oral Daily  . amLODipine  10 mg Oral Daily  . apixaban  5 mg Oral BID  . atorvastatin  80 mg Oral QHS  . carvedilol  3.125 mg Oral BID WC  . diclofenac Sodium  2 g Topical BID  . feeding supplement (NEPRO CARB STEADY)  237 mL Oral BID BM  . insulin aspart  0-20 Units Subcutaneous TID AC & HS  . insulin aspart  3 Units Subcutaneous TID with meals  . levothyroxine  100 mcg Oral QAC breakfast  . sodium zirconium cyclosilicate  10 g Oral Daily  . traZODone  50 mg Oral QHS   Continuous Infusions:   LOS: 18 days    Enzo Bi, MD [facility]   To contact the attending provider between 7A-7P or the covering provider during after hours 7P-7A, please log into the web site [email or web address] and access using universal [place] password for that web site. If you do not have the password, please call the hospital operator.  01/09/2020, 3:29 PM

## 2020-01-09 NOTE — Evaluation (Addendum)
Occupational Therapy Re-Evaluation Patient Details Name: Hector [name] MRN: [id number] DOB: 1965-06-07 Today's Date: 01/09/2020    History of Present Illness Pt is a 54 y/o M with PMH: CKD3, AFib, HLD, CHF, acohol abuse, and hypothyroidism who presented to ER secondary to progressive SOB, cough, weakness; admitted for management of acute respiratory failure with ARDS related to COVID-19.  Hospital course additionally significant for onset of L UE > LE numbness, coordination deficits; head CT negative, patient refusing MRI per chart.   Clinical Impression   Pt seen for OT re-evaluation this date in setting of prolonged hospitalization with COVID PNA. Pt continues to be relatively self limiting especially in terms of mobilization and does not agree to mobilize with OT on any level this date. Pt minimally agreeable to washing his hands (L noted to start to have an odor as pt is keeping it closed despite demo'ing ability to extend all digits with exception of 4th digit (remains slightly flexed at MP and PIP with DIP slightly extended). OT facilitates pt participation in washing hands with MOD A d/t limited ROM of L UE. In addition, OT facilitates ed re: importance of elevating L UE To reduce swelling and self-advocating for elevation. OT educates re: importance of [facility] exrecises and stretching the L hand/digits with more functional R UE. Pt with moderate reception, but self-limiting with his involvement/particpation throughout education. OT provides gentle AAROM of digits of L hand as well as gentle massage with very light pressure to demo technique, but pt grimmacing and guarding throughout and essentially does not engage in stretching his hand I'ly. Will continue to follow. Continue to anticipate pt will require SNF upon d/c from hospital and have educated pt re: importance of participation with therapy while in acute setting rather than just waiting to d/c.     Follow Up Recommendations  SNF     Equipment Recommendations  Other (comment) (defer to next level of care)    Recommendations for Other Services       Precautions / Restrictions Precautions Precautions: Fall Restrictions Weight Bearing Restrictions: No      Mobility Bed Mobility               General bed mobility comments: pt refuses    Transfers                 General transfer comment: pt refuses    Balance       Sitting balance - Comments: NT       Standing balance comment: NT                           ADL either performed or assessed with clinical judgement   ADL       Grooming: Wash/dry hands;Moderate assistance;Bed level Grooming Details (indicate cue type and reason): Pt noted to have progressing contracture of digits of L UE. It's slightly swollen and OT elevates. OT provides MOD A and pt with difficulty tolerating citing pain. OT attempts to apply very little pressure and get pt to participate as much as possible so that he can be in control of pressure. Pt very self limiting and difficult to motivate requiring moderate encouragement.         Upper Body Dressing : Maximal assistance;Bed level Upper Body Dressing Details (indicate cue type and reason): based on clinical observation Lower Body Dressing: Total assistance;Bed level Lower Body Dressing Details (indicate cue type and reason): based on clinical observation  Toilet Transfer Details (indicate cue type and reason): pt declines to get OOB for re-assessment. was MOD A on initial PT eval, but has since been requiring 2p assist. Appears to be getting weaker over course of hospitalization d/t limtied participation with therapy and general lack of carryover by patient.                 Vision Patient Visual Report: No change from baseline       Perception     Praxis      Pertinent Vitals/Pain Pain Assessment: Faces Faces Pain Scale: Hurts even more Pain Location: L hand Pain Descriptors /  Indicators: Grimacing;Guarding Pain Intervention(s): Limited activity within patient's tolerance;Monitored during session;Other (comment) (massage/stretch)     Hand Dominance Right   Extremity/Trunk Assessment Upper Extremity Assessment Upper Extremity Assessment: RUE deficits/detail;LUE deficits/detail RUE Deficits / Details: grossly 4/5 shld, elbow, grip. Sensation and motor control appear to be grossly in-tact. Pt limits re-examination of UEs LUE Deficits / Details: shoulder/elbow grossly 3+/5, grip grossly 3-/5, noted to be getting weaker versus initial evaluation and decreased ROM of digits/hand LUE Sensation: decreased light touch (describes tingling pain to the touch) LUE Coordination: decreased fine motor;decreased gross motor   Lower Extremity Assessment Lower Extremity Assessment: Defer to PT evaluation;Generalized weakness       Communication Communication Communication: Other (comment) (somewhat garbled, mostly distinguishable)   Cognition Arousal/Alertness: Awake/alert Behavior During Therapy: Flat affect Overall Cognitive Status: Within Functional Limits for tasks assessed                                 General Comments: Pt is self-limiting, requires extended time and effort from therapist to encourage participation on any level.   General Comments       Exercises Other Exercises Other Exercises: OT facilitates pt participation in washing hands with MOD A d/t limited ROM of L UE. In addition, OT facilitates ed re: importance of elevating L UE To reduce swelling and self-advocating for elevation. OT educates re: importance of [facility] exrecises and stretching the L hand/digits with more functional R UE. Pt with moderate reception, but self-limiting with his involvement/particpation throughout education. OT provides gentle AAROM of digits of L hand as well as gentle massage with very light pressure to demo technique, but pt grimmacing and guarding throughout and  essentially does not engage in stretching his hand I'ly.   Shoulder Instructions      Home Living Family/patient expects to be discharged to:: Skilled nursing facility Living Arrangements: Parent Available Help at Discharge: Family Type of Home: House Home Access: Stairs to enter [employer] of Steps: 3 Entrance Stairs-Rails: Right;Left;Can reach both Home Layout: One level               Home Equipment: Cane - single point;Walker - 2 wheels   Additional Comments: since initial evaluation, pt's mother has apparently reported that she is unable to take pt back home upon d/c as he's needing significant assistance      Prior Functioning/Environment Level of Independence: Independent        Comments: Indep with ADLs, household and community mobilization; no home O2; denies fall history.  Does endorse recent use of assist device related to current medical/functional decline.        OT Problem List:        OT Treatment/Interventions:      OT Goals(Current goals can be found in the care plan  section) Acute Rehab OT Goals Patient Stated Goal: to go to rehab and get strong. OT Goal Formulation: With patient Time For Goal Achievement: 01/23/20 Potential to Achieve Goals: Fair ADL Goals Pt Will Perform Grooming: with min assist;sitting (to perform 2-3 g/h tasks to increase fxl activity tolerance.) Pt Will Perform Lower Body Dressing:  (with AE PRN) Pt Will Transfer to Toilet: with mod assist;stand pivot transfer;bedside commode (with LRAD ~5-10' to increase fxl mobility tolernace and ADL) Pt Will Perform Toileting - Clothing Manipulation and hygiene: with mod assist;sit to/from stand Pt/caregiver will Perform Home Exercise Program: Increased ROM;Left upper extremity;Increased strength;Right Upper extremity;With minimal assist  OT Frequency: Min 1X/week   Barriers to D/C:            Co-evaluation              AM-PAC OT "6 Clicks" Daily Activity      Outcome Measure Help from another person eating meals?: A Little Help from another person taking care of personal grooming?: A Little Help from another person toileting, which includes using toliet, bedpan, or urinal?: A Lot Help from another person bathing (including washing, rinsing, drying)?: A Lot Help from another person to put on and taking off regular upper body clothing?: A Lot Help from another person to put on and taking off regular lower body clothing?: A Lot 6 Click Score: 14   End of Session Nurse Communication: Other (comment) (notified of contents of session including pt's self-limiting and unable to mobilize at this time d/t refusal.)  Activity Tolerance: Other (comment);No increased pain (self-limiting) Patient left: in bed;with call bell/phone within reach;with bed alarm set;Other (comment) (with L UE elevated)  OT Visit Diagnosis: Muscle weakness (generalized) (M62.81);Hemiplegia and hemiparesis Hemiplegia - Right/Left: Left Hemiplegia - caused by: Unspecified                Time: 3500-9381 OT Time Calculation (min): 16 min Charges:  OT General Charges $OT Visit: 1 Visit OT Evaluation $OT Re-eval: 1 Re-eval OT Treatments $Self Care/Home Management : 8-22 mins  Gerrianne Scale, MS, OTR/L ascom [phone] 01/09/20, 10:11 AM

## 2020-01-10 ENCOUNTER — Inpatient Hospital Stay: Payer: Medicaid Other

## 2020-01-10 DIAGNOSIS — U071 COVID-19: Secondary | ICD-10-CM | POA: Diagnosis not present

## 2020-01-10 DIAGNOSIS — J1282 Pneumonia due to coronavirus disease 2019: Secondary | ICD-10-CM | POA: Diagnosis not present

## 2020-01-10 LAB — GLUCOSE, CAPILLARY
Glucose-Capillary: 94 mg/dL (ref 70–99)
Glucose-Capillary: 136 mg/dL — ABNORMAL HIGH (ref 70–99)
Glucose-Capillary: 119 mg/dL — ABNORMAL HIGH (ref 70–99)
Glucose-Capillary: 134 mg/dL — ABNORMAL HIGH (ref 70–99)

## 2020-01-10 IMAGING — DX DG HAND 2V*L*
3 series · 3 of 3 positions shown · non-contrast
Comparison: None.

CLINICAL DATA: Left hand pain and swelling

EXAM:
LEFT HAND - 2 VIEW

[hand ap (1 of 2)]
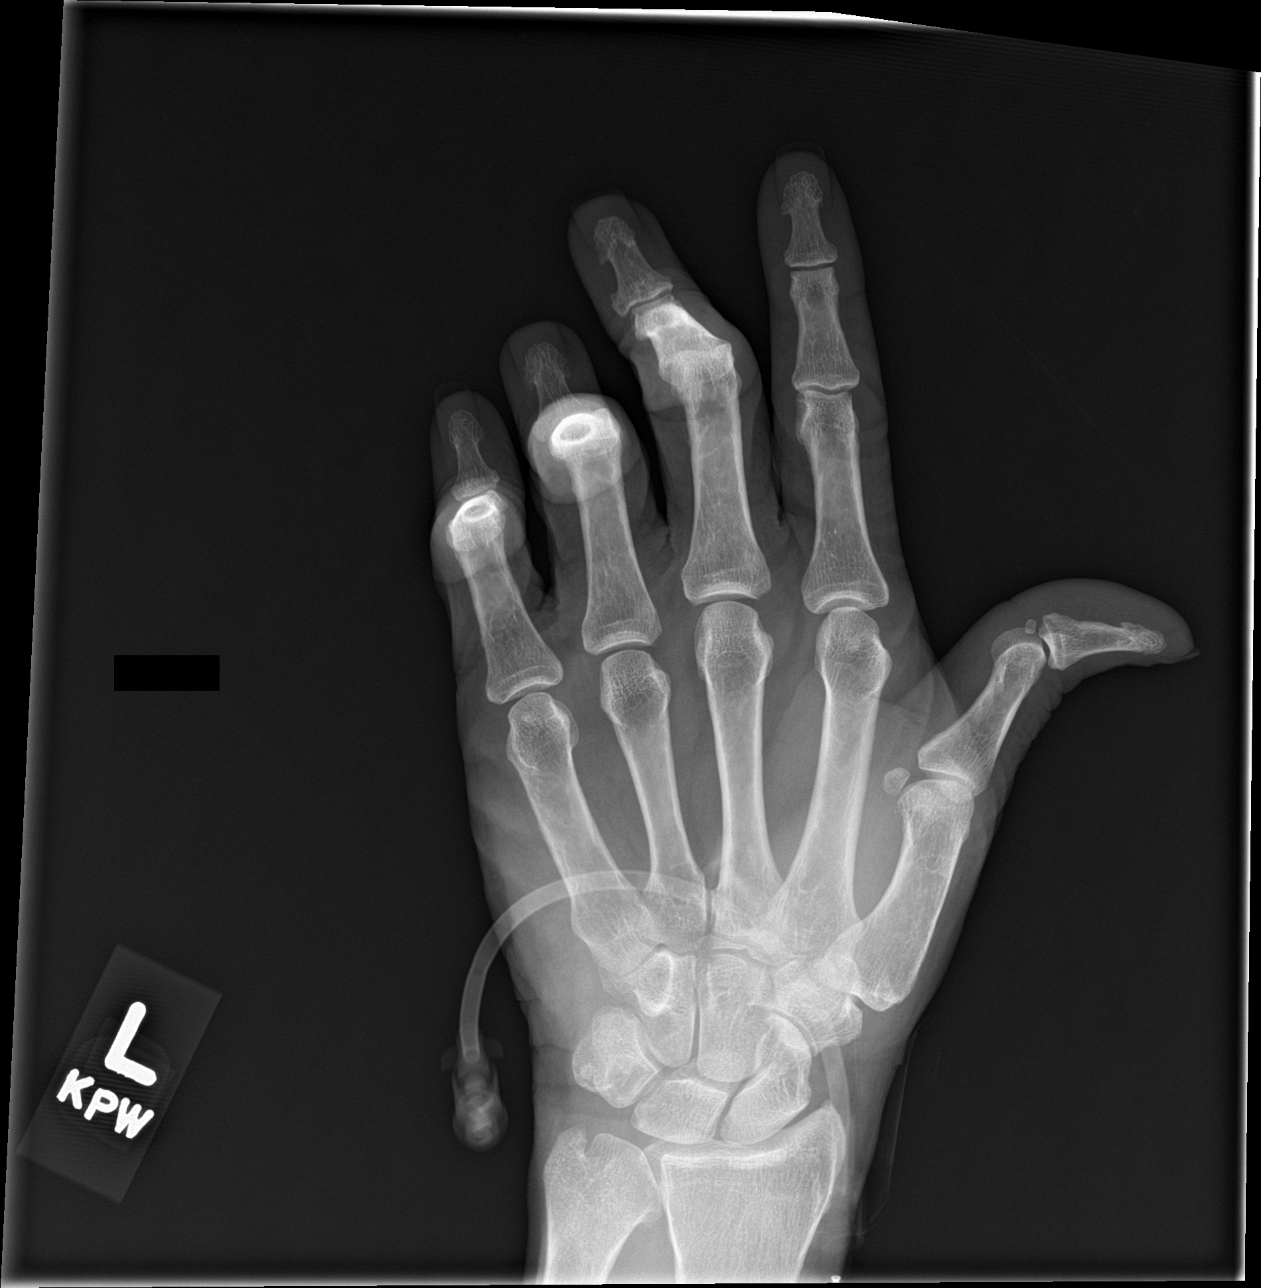

[hand lat]
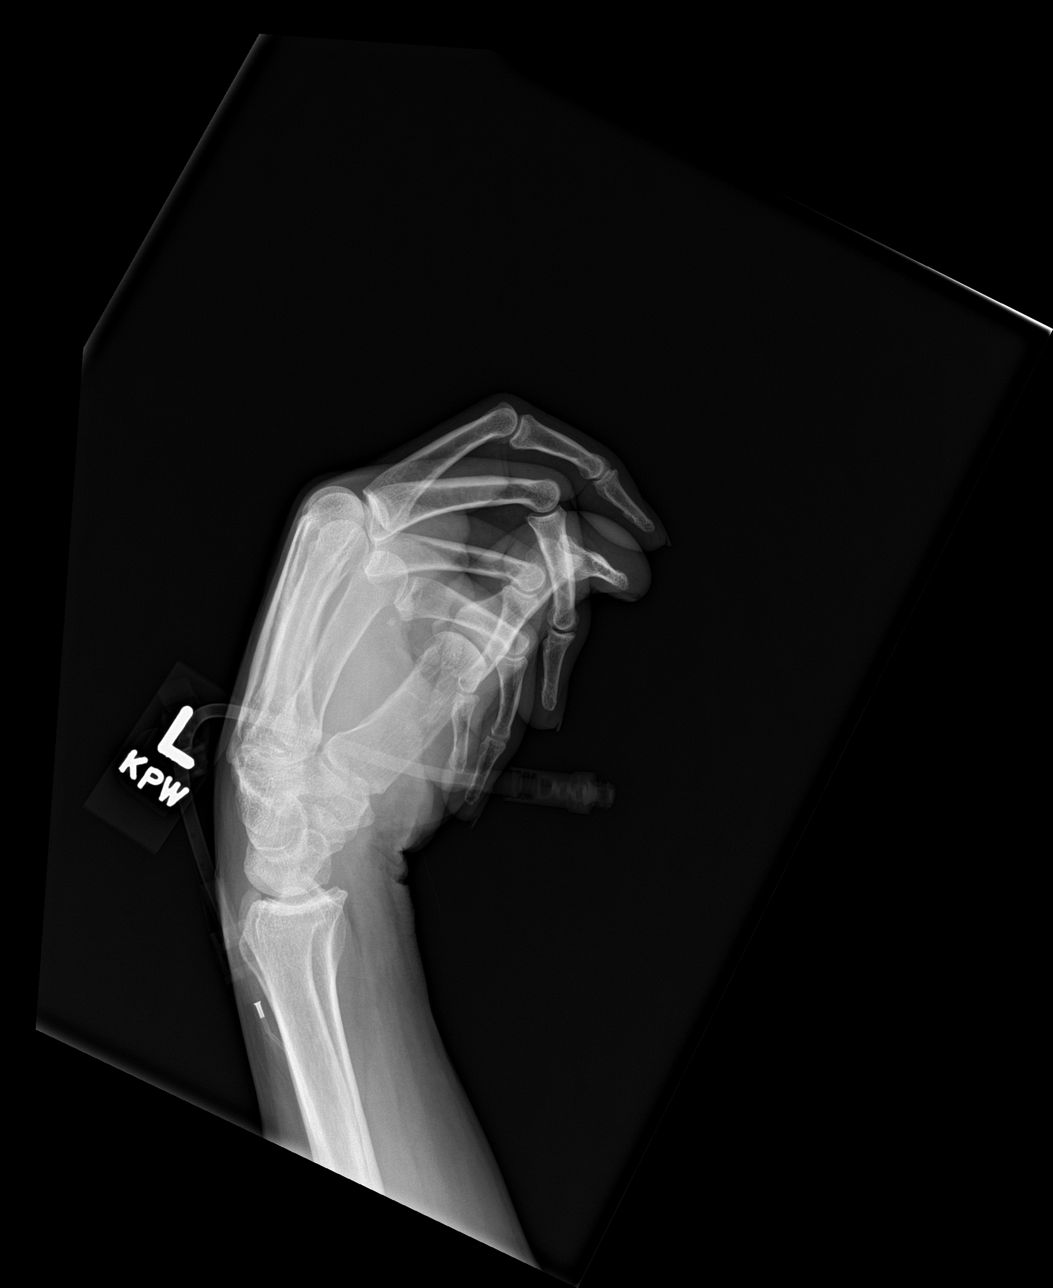

[hand ap (2 of 2)]
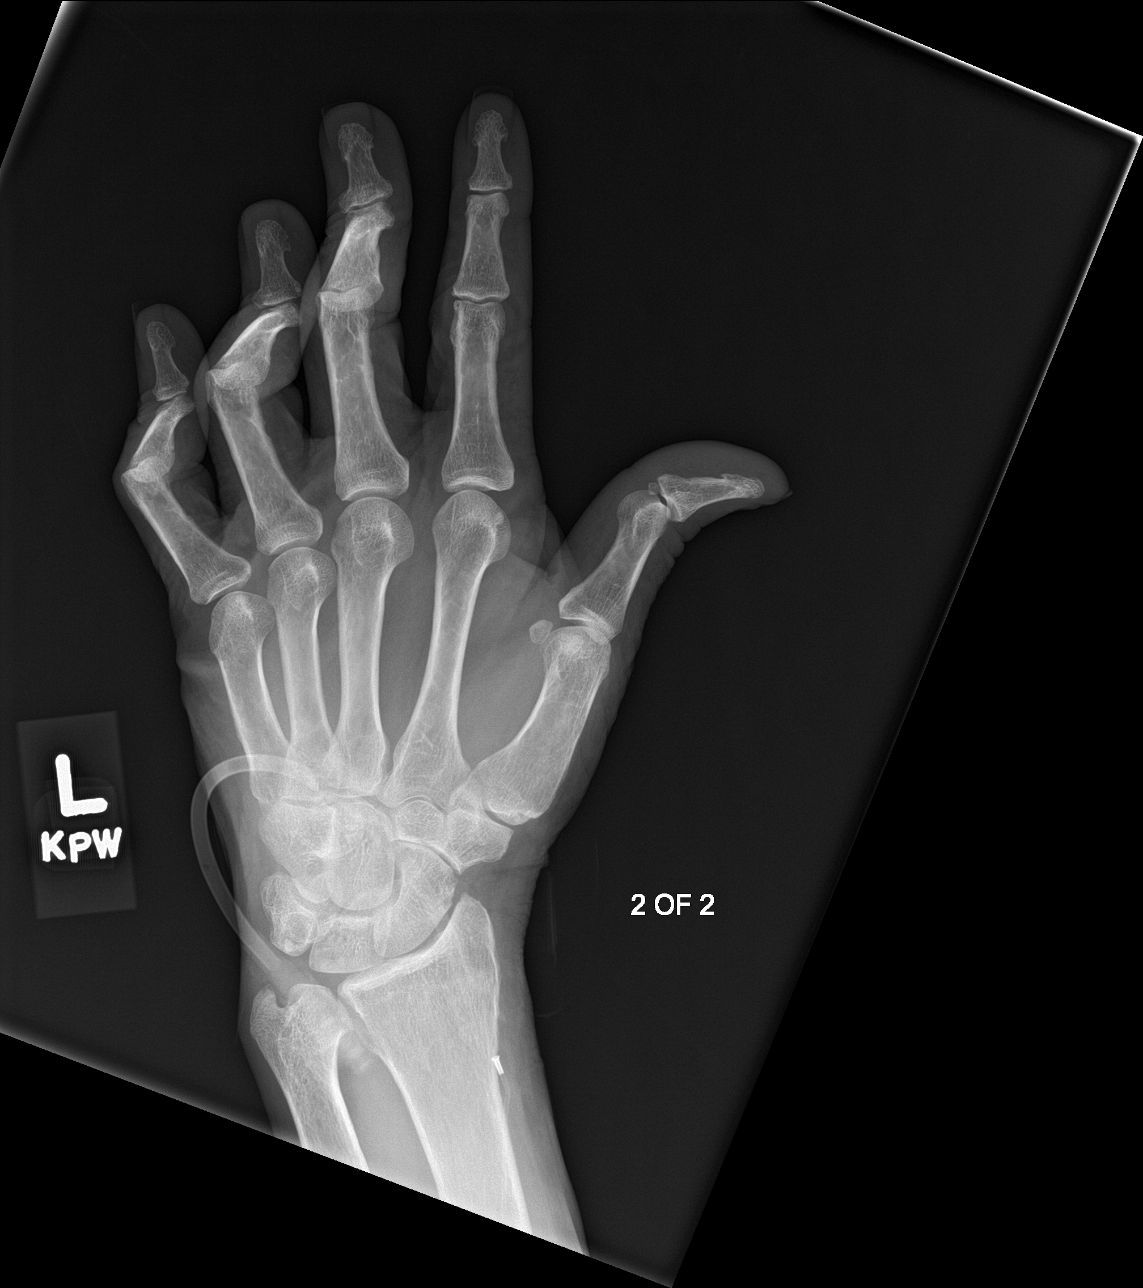

[3 of 3 positions shown; findings below may reference images not displayed]

FINDINGS: There is no evidence of fracture or dislocation. There is no
evidence of arthropathy or other focal bone abnormality. Soft
tissues are unremarkable.
IMPRESSION: Negative.

## 2020-01-10 MED ORDER — ADULT MULTIVITAMIN W/MINERALS CH
1.0000 | ORAL_TABLET | Freq: Every day | ORAL | Status: DC
  Administered 2020-01-11 – 2020-01-19 (×9): 1 via ORAL
  Filled 2020-01-10 (×9): qty 1

## 2020-01-10 MED ORDER — ALLOPURINOL 100 MG PO TABS: 100.0000 mg | ORAL_TABLET | Freq: Every day | ORAL | Status: DC

## 2020-01-10 NOTE — Progress Notes (Signed)
PT Cancellation Note  Patient Details Name: Hector [name] MRN: [id number] DOB: 1965/10/10   Cancelled Treatment:     PT attempt. Upon entering room, pt yells out," I'm not doing any fucking therapy! I'm not doing anything until they fix my L hand." Therapist tried to encourage and discuss care with pt  but pt throws Pryor Curia out of room. PT will continue current POC and will try to get pt to participate as able.   Julaine Fusi PTA 01/10/20, 12:13 PM

## 2020-01-10 NOTE — Plan of Care (Signed)

## 2020-01-10 NOTE — Progress Notes (Signed)
Nutrition Follow Up Note   DOCUMENTATION CODES:   Obesity unspecified  INTERVENTION:   Nepro Shake po BID, each supplement provides 425 kcal and 19 grams protein  MVI daily  Liberalize diet   Recommend folic acid and thiamine daily in setting of etoh abuse.   NUTRITION DIAGNOSIS:   Increased nutrient needs related to catabolic illness (COVID 19) as evidenced by increased estimated needs  GOAL:   Patient will meet greater than or equal to 90% of their needs  -progressing   MONITOR:   PO intake, Supplement acceptance, Labs, Weight trends, Skin, I & O's  ASSESSMENT:   53 y.o. black male with atrial fibrillation, hyperlipidemia, systolic congestive heart failure, alcohol abuse, hypothyroidism who was admitted to [facility] on 12/22/2019 for pneumonia due to COVID-19 virus   RD working remotely.  Unable to reach pt by phone. Pt documented to be eating 100% of meals however pt's last documented intake was from 11/13. Pt is documented to be drinking Nepro supplements. Spoke with RN who reports that pt is eating 25-40% of meals. Pt did drink one Nepro today. RD will liberalize pt's diet as he is not likely eating enough to exceed any nutrient limits. RD will also add daily MVI. Plan is for SNF after discharge.   Per chart, pt is currently down 5lbs(2%) from his admit weight; this is not significant.   Medications reviewed and include: allopurinol, insulin, synthroid, lokelma  Labs reviewed: Na 130(L), BUN 52(H), creat 2.43(H), Mg 1.7 wnl- 11/16 Wbc- 14.7(H)- 11/16 cbgs- 94, 136 x 24 hrs AIC 7.7(H)- 10/30  Diet Order:   Diet Order            Diet renal with fluid restriction Fluid restriction: Other (see comments); Room service appropriate? Yes; Fluid consistency: Thin  Diet effective now                EDUCATION NEEDS:   Not appropriate for education at this time  Skin:  Skin Assessment: Reviewed RN Assessment  Last BM:  11/16- TYPE 4  Height:   Ht Readings from  Last 1 Encounters:  12/30/19 5\' 11"  (1.803 m)    Weight:   Wt Readings from Last 1 Encounters:  01/10/20 121.2 kg    Ideal Body Weight:  78 kg  BMI:  Body mass index is 37.27 kg/m.  Estimated Nutritional Needs:   Kcal:  2700-3000kcal/day  Protein:  >135g/day  Fluid:  2.4-2.7L/day  Hector Distance MS, RD, LDN Please refer to [facility] for RD and/or RD on-call/weekend/after hours pager

## 2020-01-10 NOTE — Progress Notes (Signed)
[facility] [place] NAME: Hector [name]    MR#:  [id number]  DATE OF BIRTH:  13-Mar-1965  SUBJECTIVE:   Complains of left wrist pain. Not motivated to get out of bed or work with physical therapy.  No shortness of breath. Not much interested in talking with me. He is currently 95% on room air. REVIEW OF SYSTEMS:   Review of Systems  Unable to perform ROS: Other  pt not interested in talking. Wants to be left alone--watching TV Tolerating Diet:yes Tolerating PT: recommends rehab Refused to work with PT today  DRUG ALLERGIES:   Allergies  Allergen Reactions  . Esomeprazole Magnesium Other (See Comments)    Suspected interstitial nephritis 2018  . Eggs Or Egg-Derived Products Rash    VITALS:  Blood pressure 111/74, pulse 73, temperature 98.7 F (37.1 C), temperature source Oral, resp. rate 18, height 5\' 11"  (1.803 m), weight 121.2 kg, SpO2 93 %.  PHYSICAL EXAMINATION:   Physical Exam Limited GENERAL:  54 y.o.-year-old patient lying in the bed with no acute distress.  HEENT: Head atraumatic, normocephalic. Oropharynx and nasopharynx clear.  LUNGS: Normal breath sounds bilaterally, no wheezing, rales, rhonchi. No use of accessory muscles of respiration.  CARDIOVASCULAR: S1, S2 normal. No murmurs, rubs, or gallops.  ABDOMEN: Soft, nontender, nondistended. Bowel sounds present. No organomegaly or mass.  EXTREMITIES: No cyanosis, clubbing or edema b/l.    NEUROLOGIC: moves all extremities well. Grossly nonfocal.  PSYCHIATRIC:  patient is alert and oriented x 3. Flat affect SKIN: No obvious rash, lesion, or ulcer.   LABORATORY PANEL:  CBC Recent Labs  Lab 01/09/20 0742  WBC 14.7*  HGB 12.7*  HCT 37.8*  PLT 152    Chemistries  Recent Labs  Lab 01/09/20 0742  NA 130*  K 5.0  CL 98  CO2 23  GLUCOSE 100*  BUN 52*  CREATININE 2.43*  CALCIUM 8.5*  MG 1.7   Cardiac Enzymes No results for input(s): TROPONINI in the  last 168 hours. RADIOLOGY:  No results found. ASSESSMENT AND PLAN:  [name] is a 54 year old male with past medical history of congestive heart failure chronic kidney disease hyperlipidemia paroxysmal atrial fibrillation and alcohol use who presented to the emergency room today with 2 to 3 weeks history of shortness of breath associated with dry cough and generalized weakness. In the emergency room, his oxygen saturation was 70%, he was placed on 100% nonbreather and admitted to the ICU.   #1.  Acute hypoxemic respiratory failure, ARDS, COVID-19 pneumonia. --still needed 2L intermittently, especially when up. --completed steroids and remdesivir. --cont baricitinib while inpatient --Continue supplemental O2 to keep sats >=92%, wean as tolerated-- currently 94% on room air. No respiratory distress.  #2 Acute kidney injury on chronic kidney disease stage IIIb metabolic acidosis, resolved --Nephrology consulted, now signed off. --cont to hold lasix, Entresto, per nephrology -- creatinine at baseline 2.4  3.  Chronic combined systolic and diastolic congestive heart failure. Stable clinically --cont to hold lasix and Entresto, per nephrology  4.  Severe sepsis. Resolved  5.  Paroxysmal atrial fibrillation. Acquired thrombophilia  Continue Eliquis. --cont admiodraone and coreg  DM2 --A1c 7.7 --cont mealtime 3u TID --SSI  Hypothyroidism --cont Synthroid  HLD --cont Lipitor 80 mg daily  Left wrist/hand pain. ---[place] LUE negative for DVT -- patient has history of gout. All resume allopurinol. Will get rate x-ray of the left wrist. ---Voltaren gel BID   DVT prophylaxis: Eliquis Code  Status: Full Family Communication:  Disposition Plan:   Status is: Inpatient  Remains inpatient appropriate because:Unsafe d/c plan   Dispo: The patient is from: Home              Anticipated d/c is to: SNF              Anticipated d/c date is: 1 day               Patient currently is medically stable to d/c. patient is medically best at baseline. He is medically ready for discharge. TOC is having challenges for discharge planning to rehab. Please see TOC daily notes for updates.       TOTAL TIME TAKING CARE OF THIS PATIENT: 20 minutes.  >50% time spent on counselling and coordination of care  Note: This dictation was prepared with Dragon dictation along with smaller phrase technology. Any transcriptional errors that result from this process are unintentional.  Fritzi Mandes M.D    [facility]   CC: Primary care physician; Hector [name], MDPatient ID: Hector [name], male   DOB: Sep 20, 1965, 54 y.o.   MRN: [id number]

## 2020-01-10 NOTE — TOC Progression Note (Addendum)
Transition of Care [facility]) - Progression Note    Patient Details  Name: Hector [name] MRN: [id number] Date of Birth: 07-23-1965  Transition of Care [facility] CM/SW [place], LCSW Phone Number: 01/10/2020, 9:14 AM  Clinical Narrative:   Called SNFs again about bed availability.  [place]- called Admissions [name], she reported no beds available today, possibly tomorrow, she has patient on her wait list.    12:35- Call from [facility], she reported she thinks patient has to be 21 days from [place] test. Informed her test was 10/29, she is checking with their Administrator.   [place]- called main line, not able to reach anyone in Admissions. Called Administrator phone, left another voicemail.  [place] in [place]- left a voicemail for [employer].  [place] reached out to Admissions Ebony Hail and asked her to review patient to see if she can make a bed offer.  1:35- Ebony Hail reported she is still waiting to hear back from SNF staff on if they can accept patient.  [facility] [place]- asked Admissions Gerald Stabs to review again. 1:35- Gerald Stabs reported they are not taking patient's insurance.    Expected Discharge Plan: [place] Barriers to Discharge: Continued Medical Work up  Expected Discharge Plan and Services Expected Discharge Plan: [place] In-house Referral: NA   Post Acute Care Choice: [place] arrangements for the past 2 months: Single Family Home Expected Discharge Date: 01/03/20               DME Arranged: Oxygen DME Agency: Other - Comment [name]) Date DME Agency Contacted: 01/03/20 Time DME Agency Contacted: [phone] Representative spoke with at DME Agency: [place] ([place]) Interventions    Readmission Risk Interventions Readmission Risk Prevention Plan 01/02/2020  Transportation Screening Complete  PCP or  Specialist Appt within 3-5 Days Complete  HRI or [place] Complete  Social Work Consult for [place] Planning/Counseling Complete  Palliative Care Screening Not Applicable  Medication Review Press photographer) Complete  Some recent data might be hidden

## 2020-01-11 DIAGNOSIS — U071 COVID-19: Secondary | ICD-10-CM | POA: Diagnosis not present

## 2020-01-11 DIAGNOSIS — J1282 Pneumonia due to coronavirus disease 2019: Secondary | ICD-10-CM | POA: Diagnosis not present

## 2020-01-11 LAB — BASIC METABOLIC PANEL
Anion gap: 12 (ref 5–15)
BUN: 50 mg/dL — ABNORMAL HIGH (ref 6–20)
CO2: 23 mmol/L (ref 22–32)
Calcium: 8.7 mg/dL — ABNORMAL LOW (ref 8.9–10.3)
Chloride: 99 mmol/L (ref 98–111)
Creatinine, Ser: 2.31 mg/dL — ABNORMAL HIGH (ref 0.61–1.24)
GFR, Estimated: 33 mL/min — ABNORMAL LOW (ref >60)
Glucose, Bld: 98 mg/dL (ref 70–99)
Potassium: 4.8 mmol/L (ref 3.5–5.1)
Sodium: 134 mmol/L — ABNORMAL LOW (ref 135–145)

## 2020-01-11 LAB — GLUCOSE, CAPILLARY
Glucose-Capillary: 77 mg/dL (ref 70–99)
Glucose-Capillary: 131 mg/dL — ABNORMAL HIGH (ref 70–99)
Glucose-Capillary: 108 mg/dL — ABNORMAL HIGH (ref 70–99)
Glucose-Capillary: 139 mg/dL — ABNORMAL HIGH (ref 70–99)

## 2020-01-11 LAB — MAGNESIUM: Magnesium: 2 mg/dL (ref 1.7–2.4)

## 2020-01-11 NOTE — Progress Notes (Signed)
Physical Therapy Treatment Patient Details Name: Hector [name] MRN: [id number] DOB: 1965-05-28 Today's Date: 01/11/2020    History of Present Illness Pt is a 54 y/o M with PMH: CKD3, AFib, HLD, CHF, acohol abuse, and hypothyroidism who presented to ER secondary to progressive SOB, cough, weakness; admitted for management of acute respiratory failure with ARDS related to COVID-19.  Hospital course additionally significant for onset of L UE > LE numbness, coordination deficits; head CT negative, patient refusing MRI per chart.    PT Comments    Agreeable to participation with session this date.  Generally quick to offer "I can't" when presented with challenging task/activity, but does make good effort with encouragement and redirection.  Currently requiring mod assist for bed mobility; cga/min assist for unsupported sitting balance. Mild pushing tendancies when R UE in closed-chain position against bed surface; improved with open-chain, functional use of R UE.  Does push/list to L with divided attention and fatigue; self-corrects to neutral 75% time. Attempted scooting edge of bed to facilitate progression towards transfers; requiring max assist for scooting edge of bed, extensive assist for forward weight shift, lift off and lateral movement.  Limited closed-chain activation of bilat LEs with scooting attempts. Mild L UE flexor tone noted during session; encouraged for neutral, extended position of L hand/wrist when in bed.  Propped on pillow end of session.  May benefit from resting hand splint in future.    Follow Up Recommendations  SNF     Equipment Recommendations       Recommendations for Other Services       Precautions / Restrictions Precautions Precautions: Fall Restrictions Weight Bearing Restrictions: No    Mobility  Bed Mobility Overal bed mobility: Needs Assistance Bed Mobility: Supine to Sit;Sit to Supine     Supine to sit: Mod assist Sit to supine: Min  assist   General bed mobility comments: cuing for UE hand placement, assist for truncal elevation with bed mobility; min assist for LE management.  Transfers                 General transfer comment: declined OOB attempts  Ambulation/Gait             General Gait Details: unsafe/unable   Stairs             Wheelchair Mobility    Modified Rankin (Stroke Patients Only)       Balance Overall balance assessment: Needs assistance Sitting-balance support: No upper extremity supported;Feet supported Sitting balance-Leahy Scale: Fair Sitting balance - Comments: mild pushing behavior towards L, lists L with divided attention/fatigue; does spontaneously initiate correction approx 75% time                                    Cognition Arousal/Alertness: Awake/alert Behavior During Therapy: WFL for tasks assessed/performed Overall Cognitive Status: Within Functional Limits for tasks assessed                                 General Comments: Quick to say "I can't" throughout session, but redirectable with encouragement      Exercises Other Exercises Other Exercises: Unsupported sitting edge of bed, participated with dynamic reaching tasks to faciliate core activation, midline orientation, close sup/min assist for balance and weight shift. Mild pushing tendancies noted when R UE in closed-chain position; improved with positioning/active use of  R UE with functional task. Other Exercises: L UE grossly 3-/5 throughout, remains globally ataxic; increased difficulty with hand/wrist extension, requiring increased time/effort to complete (mild flexor tone evident).  Educated in positioning and neutral resting position of L hand (with wrist/fingers extended to neutral) and propped on pillow end of session. Other Exercises: Lateral scooting edge of bed, max assist for weight shift, lift off and lateral movement. Limited ability to coordinate, actively  utilize bilat LEs in closed-chain position.    General Comments        Pertinent Vitals/Pain Pain Assessment: Faces Faces Pain Scale: Hurts little more Pain Location: L hand, L LE Pain Descriptors / Indicators: Grimacing;Guarding Pain Intervention(s): Limited activity within patient's tolerance;Monitored during session;Repositioned    Home Living                      Prior Function            PT Goals (current goals can now be found in the care plan section) Acute Rehab PT Goals Patient Stated Goal: to go to rehab and get strong. PT Goal Formulation: With patient Time For Goal Achievement: 01/09/20 Potential to Achieve Goals: Good Progress towards PT goals: Progressing toward goals    Frequency    Min 2X/week      PT Plan Current plan remains appropriate    Co-evaluation              AM-PAC PT "6 Clicks" Mobility   Outcome Measure  Help needed turning from your back to your side while in a flat bed without using bedrails?: A Little Help needed moving from lying on your back to sitting on the side of a flat bed without using bedrails?: A Lot Help needed moving to and from a bed to a chair (including a wheelchair)?: Total Help needed standing up from a chair using your arms (e.g., wheelchair or bedside chair)?: Total Help needed to walk in hospital room?: Total Help needed climbing 3-5 steps with a railing? : Total 6 Click Score: 9    End of Session Equipment Utilized During Treatment: Oxygen Activity Tolerance: Patient tolerated treatment well Patient left: in bed;with call bell/phone within reach;with bed alarm set Nurse Communication: Mobility status PT Visit Diagnosis: Muscle weakness (generalized) (M62.81);Difficulty in walking, not elsewhere classified (R26.2)     Time: 7903-8333 PT Time Calculation (min) (ACUTE ONLY): 33 min  Charges:  $Therapeutic Activity: 8-22 mins $Neuromuscular Re-education: 8-22 mins                      Jestine Bicknell H. Owens Shark, PT, DPT, NCS 01/11/20, 3:56 PM [phone]

## 2020-01-11 NOTE — TOC Progression Note (Addendum)
Transition of Care [facility]) - Progression Note    Patient Details  Name: Hector [name] MRN: [id number] Date of Birth: [birth year]  Transition of Care [facility]) CM/SW [place], LCSW Phone Number: 01/11/2020, 10:25 AM  Clinical Narrative:   Called SNFs again.  [facility] who reported they have to wait for 21 day period. Informed her tomorrow is patient's 21 day mark after his positive COVID test. Asked if they can take patient tomorrow. She said she will have to check with their Administrator.   [place] main number x 3, busy signal. Therapist, occupational phone, left another voicemail.   [place] who reported they won't be able to take patient at this time due to his insurance.   [place] who reported she talked to both of their liasions and is not sure who made a bed offer. She has to check if they are in network with [facility] Medicaid. CSW will email patient's information to [facility] to review if they are in network.   12:55- Called to patient's room, patient confirmed he still wants to go to rehab. CSW explained that he needs to participate in PT/OT in order for insurance to cover rehab stay. He verbalized understanding and said he plans to participate today.   Expected Discharge Plan: [place] Barriers to Discharge: Continued Medical Work up  Expected Discharge Plan and Services Expected Discharge Plan: [place] In-house Referral: NA   Post Acute Care Choice: [place] arrangements for the past 2 months: Single Family Home Expected Discharge Date: 01/03/20               DME Arranged: Oxygen DME Agency: Other - Comment [name]) Date DME Agency Contacted: 01/03/20 Time DME Agency Contacted: [phone] Representative spoke with at DME Agency: [place] ([place])  Interventions    Readmission Risk Interventions Readmission Risk Prevention Plan 01/02/2020  Transportation Screening Complete  PCP or Specialist Appt within 3-5 Days Complete  HRI or [place] Complete  Social Work Consult for [place] Planning/Counseling Complete  Palliative Care Screening Not Applicable  Medication Review Press photographer) Complete  Some recent data might be hidden

## 2020-01-11 NOTE — Progress Notes (Signed)
[facility] [place] NAME: Hector [name]    MR#:  [id number]  DATE OF BIRTH:  [birth year]  SUBJECTIVE:   Complains of left wrist pain. Not motivated to get out of bed or work with physical therapy.  No shortness of breath. Not much interested in talking with me. He is currently 55% o REVIEW OF SYSTEMS:   Review of Systems  Unable to perform ROS: Other  pt not interested in talking.  Tolerating Diet:yes Tolerating PT: recommends rehab  DRUG ALLERGIES:   Allergies  Allergen Reactions  . Esomeprazole Magnesium Other (See Comments)    Suspected interstitial nephritis 2018  . Eggs Or Egg-Derived Products Rash    VITALS:  Blood pressure 139/86, pulse 81, temperature 98 F (36.7 C), resp. rate 18, height 5\' 11"  (1.803 m), weight 121.2 kg, SpO2 96 %.  PHYSICAL EXAMINATION:   Physical Exam Limited GENERAL:  54 y.o.-year-old patient lying in the bed with no acute distress.  HEENT: Head atraumatic, normocephalic. Oropharynx and nasopharynx clear.  LUNGS: Normal breath sounds bilaterally, no wheezing, rales, rhonchi. No use of accessory muscles of respiration.  CARDIOVASCULAR: S1, S2 normal. No murmurs, rubs, or gallops.  ABDOMEN: Soft, nontender, nondistended. Bowel sounds present. No organomegaly or mass.  EXTREMITIES: No cyanosis, clubbing or edema b/l. Left UE weakness+ mild. No swelling of the joint   NEUROLOGIC: moves all extremities well. Grossly nonfocal.  PSYCHIATRIC:  patient is alert and oriented x 3. Flat affect SKIN: No obvious rash, lesion, or ulcer.   LABORATORY PANEL:  CBC Recent Labs  Lab 01/09/20 0742  WBC 14.7*  HGB 12.7*  HCT 37.8*  PLT 152    Chemistries  Recent Labs  Lab 01/09/20 0742 01/11/20 0851 01/11/20 0914  NA   < >  --  134*  K   < >  --  4.8  CL   < >  --  99  CO2   < >  --  23  GLUCOSE   < >  --  98  BUN   < >  --  50*  CREATININE   < >  --  2.31*  CALCIUM   < >  --  8.7*  MG  --   2.0  --    < > = values in this interval not displayed.   Cardiac Enzymes No results for input(s): TROPONINI in the last 168 hours. RADIOLOGY:  DG Hand 2 View Left  Result Date: 01/10/2020 CLINICAL DATA:  Left hand pain and swelling EXAM: LEFT HAND - 2 VIEW COMPARISON:  None. FINDINGS: There is no evidence of fracture or dislocation. There is no evidence of arthropathy or other focal bone abnormality. Soft tissues are unremarkable. IMPRESSION: Negative. Electronically Signed   By: Prudencio Pair M.D.   On: 01/10/2020 15:50   ASSESSMENT AND PLAN:  [name] is a 54 year old male with past medical history of congestive heart failure chronic kidney disease hyperlipidemia paroxysmal atrial fibrillation and alcohol use who presented to the emergency room today with 2 to 3 weeks history of shortness of breath associated with dry cough and generalized weakness. In the emergency room, his oxygen saturation was 70%, he was placed on 100% nonbreather and admitted to the ICU.   #1.  Acute hypoxemic respiratory failure, ARDS, COVID-19 pneumonia. --completed steroids and remdesivir. --cont baricitinib while inpatient --Continue supplemental O2 to keep sats >=92%, wean as tolerated-- currently 94% on room air. No respiratory distress. -COVID -  43 diagnosed on 12/22/2019  #2 Acute kidney injury on chronic kidney disease stage IIIb metabolic acidosis, resolved --Nephrology consulted, now signed off. --cont to hold lasix, Entresto, per nephrology -- creatinine at baseline 2.4  3.  Chronic combined systolic and diastolic congestive heart failure. Stable clinically --cont to hold lasix and Entresto, per nephrology  4.  Severe sepsis. Resolved  5.  Paroxysmal atrial fibrillation. Acquired thrombophilia  Continue Eliquis. --cont admiodraone and coreg  DM2 --A1c 7.7 --cont mealtime 3u TID --SSI  Hypothyroidism --cont Synthroid  HLD --cont Lipitor 80 mg daily  Left  wrist/hand pain ?nerve impingement ---[place] LUE negative for DVT -- patient has history of gout. All resume allopurinol.  --x-ray of the left wrist--neg for fracture ---Voltaren gel BID -- recommend cont OT   DVT prophylaxis: Eliquis Code Status: Full Family Communication: pt does not want to have family contacted Disposition Plan:   Status is: Inpatient  Remains inpatient appropriate because:Unsafe d/c plan   Dispo: The patient is from: Home              Anticipated d/c is to: SNF              Anticipated d/c date is: 1 day              Patient currently is medically stable to d/c. patient is medically best at baseline. He is medically ready for discharge. TOC is having challenges for discharge planning to rehab. Please see TOC daily notes for updates.  01/11/20-- Messaged Physician advisor regarding LOS and challenging d/c disposition       TOTAL TIME TAKING CARE OF THIS PATIENT: 20 minutes.  >50% time spent on counselling and coordination of care  Note: This dictation was prepared with Dragon dictation along with smaller phrase technology. Any transcriptional errors that result from this process are unintentional.  Fritzi Mandes M.D    [facility]   CC: Primary care physician; [name], MDPatient ID: Hector [name], male   DOB: November 30, 1965, 54 y.o.   MRN: [id number]

## 2020-01-11 NOTE — Plan of Care (Signed)
  Problem: Education: Goal: Knowledge of General Education information will improve Description: Including pain rating scale, medication(s)/side effects and non-pharmacologic comfort measures Outcome: Progressing   Problem: Health Behavior/Discharge Planning: Goal: Ability to manage health-related needs will improve Outcome: Progressing   Problem: Clinical Measurements: Goal: Ability to maintain clinical measurements within normal limits will improve Outcome: Progressing Goal: Will remain free from infection Outcome: Progressing Goal: Diagnostic test results will improve Outcome: Progressing Goal: Respiratory complications will improve Outcome: Progressing Goal: Cardiovascular complication will be avoided Outcome: Progressing   Problem: Activity: Goal: Risk for activity intolerance will decrease Outcome: Progressing   Problem: Nutrition: Goal: Adequate nutrition will be maintained Outcome: Progressing   Problem: Coping: Goal: Level of anxiety will decrease Outcome: Progressing   Problem: Elimination: Goal: Will not experience complications related to bowel motility Outcome: Progressing Goal: Will not experience complications related to urinary retention Outcome: Progressing   Problem: Pain Managment: Goal: General experience of comfort will improve Outcome: Progressing   Problem: Education: Goal: Knowledge of risk factors and measures for prevention of condition will improve Outcome: Progressing   Problem: Fluid Volume: Goal: Compliance with measures to maintain balanced fluid volume will improve Outcome: Progressing   Problem: Clinical Measurements: Goal: Complications related to the disease process, condition or treatment will be avoided or minimized Outcome: Progressing

## 2020-01-12 DIAGNOSIS — J1282 Pneumonia due to coronavirus disease 2019: Secondary | ICD-10-CM | POA: Diagnosis not present

## 2020-01-12 DIAGNOSIS — U071 COVID-19: Secondary | ICD-10-CM | POA: Diagnosis not present

## 2020-01-12 LAB — GLUCOSE, CAPILLARY
Glucose-Capillary: 98 mg/dL (ref 70–99)
Glucose-Capillary: 105 mg/dL — ABNORMAL HIGH (ref 70–99)
Glucose-Capillary: 116 mg/dL — ABNORMAL HIGH (ref 70–99)
Glucose-Capillary: 110 mg/dL — ABNORMAL HIGH (ref 70–99)

## 2020-01-12 MED ORDER — METHOCARBAMOL 500 MG PO TABS
500.0000 mg | ORAL_TABLET | Freq: Once | ORAL | Status: AC
  Administered 2020-01-12: 500 mg via ORAL
  Filled 2020-01-12 (×2): qty 1

## 2020-01-12 NOTE — Progress Notes (Signed)
[facility] [place] NAME: Hector [name]    MR#:  [id number]  DATE OF BIRTH:  1965-06-30  SUBJECTIVE:   Resting quietly. Did participate with PT yesterday. No new issues per RN. REVIEW OF SYSTEMS:   Review of Systems  Constitutional: Negative for chills, fever and weight loss.  HENT: Negative for ear discharge, ear pain and nosebleeds.   Eyes: Negative for blurred vision, pain and discharge.  Respiratory: Negative for sputum production, shortness of breath, wheezing and stridor.   Cardiovascular: Negative for chest pain, palpitations, orthopnea and PND.  Gastrointestinal: Negative for abdominal pain, diarrhea, nausea and vomiting.  Genitourinary: Negative for frequency and urgency.  Musculoskeletal: Negative for back pain and joint pain.  Neurological: Negative for sensory change, speech change, focal weakness and weakness.  Psychiatric/Behavioral: Negative for depression and hallucinations. The patient is not nervous/anxious.    Tolerating Diet:yes Tolerating PT: recommends rehab  DRUG ALLERGIES:   Allergies  Allergen Reactions   Esomeprazole Magnesium Other (See Comments)    Suspected interstitial nephritis 2018   Eggs Or Egg-Derived Products Rash    VITALS:  Blood pressure 114/73, pulse 75, temperature 97.8 F (36.6 C), temperature source Oral, resp. rate 20, height 5\' 11"  (1.803 m), weight 117.7 kg, SpO2 94 %.  PHYSICAL EXAMINATION:   Physical Exam Limited GENERAL:  54 y.o.-year-old patient lying in the bed with no acute distress.  HEENT: Head atraumatic, normocephalic. Oropharynx and nasopharynx clear.  LUNGS: Normal breath sounds bilaterally, no wheezing, rales, rhonchi. No use of accessory muscles of respiration.  CARDIOVASCULAR: S1, S2 normal. No murmurs, rubs, or gallops.  ABDOMEN: Soft, nontender, nondistended. Bowel sounds present. No organomegaly or mass.  EXTREMITIES: No cyanosis, clubbing or edema b/l. Left  UE weakness+ mild. No swelling of the joint   NEUROLOGIC: moves all extremities well. Grossly nonfocal.  PSYCHIATRIC:  patient is alert and oriented x 3. Flat affect SKIN: No obvious rash, lesion, or ulcer.   LABORATORY PANEL:  CBC Recent Labs  Lab 01/09/20 0742  WBC 14.7*  HGB 12.7*  HCT 37.8*  PLT 152    Chemistries  Recent Labs  Lab 01/09/20 0742 01/11/20 0851 01/11/20 0914  NA   < >  --  134*  K   < >  --  4.8  CL   < >  --  99  CO2   < >  --  23  GLUCOSE   < >  --  98  BUN   < >  --  50*  CREATININE   < >  --  2.31*  CALCIUM   < >  --  8.7*  MG  --  2.0  --    < > = values in this interval not displayed.   Cardiac Enzymes No results for input(s): TROPONINI in the last 168 hours. RADIOLOGY:  No results found. ASSESSMENT AND PLAN:  [name] is a 54 year old male with past medical history of congestive heart failure chronic kidney disease hyperlipidemia paroxysmal atrial fibrillation and alcohol use who presented to the emergency room today with 2 to 3 weeks history of shortness of breath associated with dry cough and generalized weakness. In the emergency room, his oxygen saturation was 70%, he was placed on 100% nonbreather and admitted to the ICU.   #1.  Acute hypoxemic respiratory failure, ARDS, COVID-19 pneumonia. --completed steroids and remdesivir. --cont baricitinib while inpatient --Continue supplemental O2 to keep sats >=92%, wean as tolerated-- currently  94% on room air. No respiratory distress. -COVID -43 diagnosed on 12/22/2019  #2 Acute kidney injury on chronic kidney disease stage IIIb metabolic acidosis, resolved --Nephrology consulted, now signed off. --cont to hold lasix, Entresto, per nephrology -- creatinine at baseline 2.4  3.  Chronic combined systolic and diastolic congestive heart failure. Stable clinically --cont to hold lasix and Entresto, per nephrology  4.  Severe sepsis. Resolved  5.  Paroxysmal atrial  fibrillation. Acquired thrombophilia  Continue Eliquis. --cont admiodraone and coreg  DM2 --A1c 7.7 --cont mealtime 3u TID --SSI  Hypothyroidism --cont Synthroid  HLD --cont Lipitor 80 mg daily  Left wrist/hand pain ?nerve impingement ---[place] LUE negative for DVT -- patient has history of gout. All resume allopurinol.  --x-ray of the left wrist--neg for fracture ---Voltaren gel BID -- recommend cont OT   DVT prophylaxis: Eliquis Code Status: Full Family Communication: pt does not want to have family contacted Disposition Plan:   Status is: Inpatient  Remains inpatient appropriate because:Unsafe d/c plan   Dispo: The patient is from: Home              Anticipated d/c is to: SNF              Anticipated d/c date is: 1 day              Patient currently is medically stable to d/c. patient is medically best at baseline. He is medically ready for discharge. TOC is having challenges for discharge planning to rehab. Please see TOC daily notes for updates.  01/11/20-- Messaged Physician advisor Dr Doy Hutching regarding LOS and challenging d/c disposition       TOTAL TIME TAKING CARE OF THIS PATIENT: 20 minutes.  >50% time spent on counselling and coordination of care  Note: This dictation was prepared with Dragon dictation along with smaller phrase technology. Any transcriptional errors that result from this process are unintentional.  Fritzi Mandes M.D    [facility]   CC: Primary care physician; [name], MDPatient ID: Hector [name], male   DOB: March 11, 1965, 54 y.o.   MRN: [id number]

## 2020-01-12 NOTE — Plan of Care (Signed)
  Problem: Education: Goal: Knowledge of General Education information will improve Description: Including pain rating scale, medication(s)/side effects and non-pharmacologic comfort measures Outcome: Progressing   Problem: Health Behavior/Discharge Planning: Goal: Ability to manage health-related needs will improve Outcome: Progressing   Problem: Clinical Measurements: Goal: Ability to maintain clinical measurements within normal limits will improve Outcome: Progressing Goal: Will remain free from infection Outcome: Progressing Goal: Diagnostic test results will improve Outcome: Progressing Goal: Respiratory complications will improve Outcome: Progressing Goal: Cardiovascular complication will be avoided Outcome: Progressing   Problem: Activity: Goal: Risk for activity intolerance will decrease Outcome: Progressing   Problem: Nutrition: Goal: Adequate nutrition will be maintained Outcome: Progressing   Problem: Coping: Goal: Level of anxiety will decrease Outcome: Progressing   Problem: Elimination: Goal: Will not experience complications related to bowel motility Outcome: Progressing Goal: Will not experience complications related to urinary retention Outcome: Progressing   Problem: Pain Managment: Goal: General experience of comfort will improve Outcome: Progressing   Problem: Safety: Goal: Ability to remain free from injury will improve Outcome: Progressing   Problem: Skin Integrity: Goal: Risk for impaired skin integrity will decrease Outcome: Progressing   Problem: Education: Goal: Knowledge of risk factors and measures for prevention of condition will improve Outcome: Progressing   Problem: Coping: Goal: Psychosocial and spiritual needs will be supported Outcome: Progressing   Problem: Respiratory: Goal: Will maintain a patent airway Outcome: Progressing   Problem: Education: Goal: Knowledge of disease and its progression will improve Outcome:  Progressing Goal: Individualized Educational Video(s) Outcome: Progressing   Problem: Fluid Volume: Goal: Compliance with measures to maintain balanced fluid volume will improve Outcome: Progressing   Problem: Health Behavior/Discharge Planning: Goal: Ability to manage health-related needs will improve Outcome: Progressing   Problem: Nutritional: Goal: Ability to make healthy dietary choices will improve Outcome: Progressing   Problem: Clinical Measurements: Goal: Complications related to the disease process, condition or treatment will be avoided or minimized Outcome: Progressing

## 2020-01-12 NOTE — TOC Progression Note (Signed)
Transition of Care [facility]) - Progression Note    Patient Details  Name: Hector [name] MRN: [id number] Date of Birth: 1965-03-25  Transition of Care [facility]) CM/SW [place], LCSW Phone Number: 01/12/2020, 8:37 AM  Clinical Narrative:   Darleen Crocker- spoke to [place]. Reminded her patient has completed his 21 days from positive COVID test. She asked about behaviors, informed her patient is irritable at times but no behaviors. Informed her patient was much more motivated with PT yesterday. She asked about any alcohol withdrawal symptoms, CSW confirmed with Dr. Posey Pronto no withdrawal symptoms. [name] reported she is pretty sure they can take patient, just not sure if they would take patient today as they only take 2 per day and already have 2 admissions scheduled for today. She is going to ask Forensic psychologist. If not, she reported she will follow up Monday. She reported she will start insurance authorization today with [employer] Medicaid, she is not sure how long this will take. Updated MD.  Expected Discharge Plan: [place] Barriers to Discharge: Continued Medical Work up  Expected Discharge Plan and Services Expected Discharge Plan: [place] In-house Referral: NA   Post Acute Care Choice: [place] arrangements for the past 2 months: Single Family Home Expected Discharge Date: 01/03/20               DME Arranged: [facility]xygen DME Agency: Other - Comment [name]) Date DME Agency Contacted: 01/03/20 Time DME Agency Contacted: [phone] Representative spoke with at DME Agency: [place] ([place]) Interventions    Readmission Risk Interventions Readmission Risk Prevention Plan 01/02/2020  Transportation Screening Complete  PCP or Specialist Appt within 3-5 Days Complete  HRI or [place] Complete  Social Work Consult for [place] Planning/Counseling Complete  Palliative  Care Screening Not Applicable  Medication Review Press photographer) Complete  Some recent data might be hidden

## 2020-01-13 DIAGNOSIS — U071 COVID-19: Secondary | ICD-10-CM | POA: Diagnosis not present

## 2020-01-13 DIAGNOSIS — J1282 Pneumonia due to coronavirus disease 2019: Secondary | ICD-10-CM | POA: Diagnosis not present

## 2020-01-13 LAB — GLUCOSE, CAPILLARY
Glucose-Capillary: 110 mg/dL — ABNORMAL HIGH (ref 70–99)
Glucose-Capillary: 159 mg/dL — ABNORMAL HIGH (ref 70–99)
Glucose-Capillary: 181 mg/dL — ABNORMAL HIGH (ref 70–99)
Glucose-Capillary: 128 mg/dL — ABNORMAL HIGH (ref 70–99)

## 2020-01-13 LAB — CBC
HCT: 38 % — ABNORMAL LOW (ref 39.0–52.0)
Hemoglobin: 12.5 g/dL — ABNORMAL LOW (ref 13.0–17.0)
MCH: 28.8 pg (ref 26.0–34.0)
MCHC: 32.9 g/dL (ref 30.0–36.0)
MCV: 87.6 fL (ref 80.0–100.0)
Platelets: 200 10*3/uL (ref 150–400)
RBC: 4.34 MIL/uL (ref 4.22–5.81)
RDW: 14.2 % (ref 11.5–15.5)
WBC: 7.3 10*3/uL (ref 4.0–10.5)
nRBC: 0 % (ref 0.0–0.2)

## 2020-01-13 LAB — CREATININE, SERUM
Creatinine, Ser: 2.28 mg/dL — ABNORMAL HIGH (ref 0.61–1.24)
GFR, Estimated: 33 mL/min — ABNORMAL LOW (ref >60)

## 2020-01-13 MED ORDER — METHOCARBAMOL 500 MG PO TABS
500.0000 mg | ORAL_TABLET | Freq: Once | ORAL | Status: AC
  Administered 2020-01-13: 500 mg via ORAL
  Filled 2020-01-13: qty 1

## 2020-01-13 NOTE — Progress Notes (Signed)
[facility] NAME: Hector [name]    MR#:  [id number]  DATE OF BIRTH:  [birth year]  SUBJECTIVE:   Resting quietly. No new issues per RN. REVIEW OF SYSTEMS:   Review of Systems  Constitutional: Negative for chills, fever and weight loss.  HENT: Negative for ear discharge, ear pain and nosebleeds.   Eyes: Negative for blurred vision, pain and discharge.  Respiratory: Negative for sputum production, shortness of breath, wheezing and stridor.   Cardiovascular: Negative for chest pain, palpitations, orthopnea and PND.  Gastrointestinal: Negative for abdominal pain, diarrhea, nausea and vomiting.  Genitourinary: Negative for frequency and urgency.  Musculoskeletal: Negative for back pain and joint pain.  Neurological: Negative for sensory change, speech change, focal weakness and weakness.  Psychiatric/Behavioral: Negative for depression and hallucinations. The patient is not nervous/anxious.    Tolerating Diet:yes Tolerating PT: recommends rehab  DRUG ALLERGIES:   Allergies  Allergen Reactions  . Esomeprazole Magnesium Other (See Comments)    Suspected interstitial nephritis 2018  . Eggs Or Egg-Derived Products Rash    VITALS:  Blood pressure 119/89, pulse 86, temperature 98.4 F (36.9 C), resp. rate 17, height 5\' 11"  (1.803 m), weight 117.7 kg, SpO2 94 %.  PHYSICAL EXAMINATION:   Physical Exam Limited GENERAL:  54 y.o.-year-old patient lying in the bed with no acute distress.  HEENT: Head atraumatic, normocephalic. Oropharynx and nasopharynx clear.  LUNGS: Normal breath sounds bilaterally, no wheezing, rales, rhonchi. No use of accessory muscles of respiration.  CARDIOVASCULAR: S1, S2 normal. No murmurs, rubs, or gallops.  ABDOMEN: Soft, nontender, nondistended. Bowel sounds present. No organomegaly or mass.  EXTREMITIES: No cyanosis, clubbing or edema b/l. Left UE weakness+ mild. No swelling of the joint   NEUROLOGIC:  moves all extremities well. Grossly nonfocal.  PSYCHIATRIC:  patient is alert and oriented x 3. Flat affect SKIN: No obvious rash, lesion, or ulcer.   LABORATORY PANEL:  CBC Recent Labs  Lab 01/13/20 0605  WBC 7.3  HGB 12.5*  HCT 38.0*  PLT 200    Chemistries  Recent Labs  Lab 01/09/20 0742 01/11/20 0851 01/11/20 0914 01/11/20 0914 01/13/20 0605  NA   < >  --  134*  --   --   K   < >  --  4.8  --   --   CL   < >  --  99  --   --   CO2   < >  --  23  --   --   GLUCOSE   < >  --  98  --   --   BUN   < >  --  50*  --   --   CREATININE   < >  --  2.31*   < > 2.28*  CALCIUM   < >  --  8.7*  --   --   MG  --  2.0  --   --   --    < > = values in this interval not displayed.   Cardiac Enzymes No results for input(s): TROPONINI in the last 168 hours. RADIOLOGY:  No results found. ASSESSMENT AND PLAN:  [name] is a 54 year old male with past medical history of congestive heart failure chronic kidney disease hyperlipidemia paroxysmal atrial fibrillation and alcohol use who presented to the emergency room today with 2 to 3 weeks history of shortness of breath associated with dry cough and generalized weakness. In  the emergency room, his oxygen saturation was 70%, he was placed on 100% nonbreather and admitted to the ICU.   #1.  Acute hypoxemic respiratory failure, ARDS, COVID-19 pneumonia. --completed steroids and remdesivir. --cont baricitinib while inpatient --Continue supplemental O2 to keep sats >=92%, wean as tolerated-- currently 94% on room air. No respiratory distress. -COVID -49 diagnosed on 12/22/2019  #2 Acute kidney injury on chronic kidney disease stage IIIb metabolic acidosis, resolved --Nephrology consulted, now signed off. --cont to hold lasix, Entresto, per nephrology -- creatinine at baseline 2.4  3.  Chronic combined systolic and diastolic congestive heart failure. Stable clinically --cont to hold lasix and Entresto, per  nephrology  4.  Severe sepsis. Resolved  5.  Paroxysmal atrial fibrillation. Acquired thrombophilia  Continue Eliquis. --cont admiodraone and coreg  DM2 --A1c 7.7 --cont mealtime 3u TID --SSI  Hypothyroidism --cont Synthroid  HLD --cont Lipitor 80 mg daily  Left wrist/hand pain ?nerve impingement ---[place] LUE negative for DVT -- patient has history of gout. All resume allopurinol.  --x-ray of the left wrist--neg for fracture ---Voltaren gel BID -- recommend cont OT   DVT prophylaxis: Eliquis Code Status: Full Family Communication: pt does not want to have family contacted Disposition Plan:   Status is: Inpatient  Remains inpatient appropriate because:Unsafe d/c plan   Dispo: The patient is from: Home              Anticipated d/c is to: SNF              Anticipated d/c date is: 1 day              Patient currently is medically stable to d/c. patient is medically best at baseline. He is medically ready for discharge. TOC is having challenges for discharge planning to rehab. Please see TOC daily notes for updates.  01/11/20-- Messaged Physician advisor Dr Doy Hutching regarding LOS and challenging d/c disposition  01/13/20-- patient likely will go to [facility] facility on Monday if bed is offered.      TOTAL TIME TAKING CARE OF THIS PATIENT: 20 minutes.  >50% time spent on counselling and coordination of care  Note: This dictation was prepared with Dragon dictation along with smaller phrase technology. Any transcriptional errors that result from this process are unintentional.  Fritzi Mandes M.D    [facility]   CC: Primary care physician; [name], MDPatient ID: Hector [name], male   DOB: 12/30/1965, 54 y.o.   MRN: [id number]

## 2020-01-13 NOTE — Plan of Care (Signed)
  Problem: Education: Goal: Knowledge of General Education information will improve Description: Including pain rating scale, medication(s)/side effects and non-pharmacologic comfort measures Outcome: Progressing   Problem: Health Behavior/Discharge Planning: Goal: Ability to manage health-related needs will improve Outcome: Progressing   Problem: Clinical Measurements: Goal: Ability to maintain clinical measurements within normal limits will improve Outcome: Progressing Goal: Will remain free from infection Outcome: Progressing Goal: Diagnostic test results will improve Outcome: Progressing Goal: Respiratory complications will improve Outcome: Progressing Goal: Cardiovascular complication will be avoided Outcome: Progressing   Problem: Activity: Goal: Risk for activity intolerance will decrease Outcome: Progressing   Problem: Nutrition: Goal: Adequate nutrition will be maintained Outcome: Progressing   Problem: Coping: Goal: Level of anxiety will decrease Outcome: Progressing   Problem: Elimination: Goal: Will not experience complications related to bowel motility Outcome: Progressing Goal: Will not experience complications related to urinary retention Outcome: Progressing   Problem: Pain Managment: Goal: General experience of comfort will improve Outcome: Progressing   Problem: Safety: Goal: Ability to remain free from injury will improve Outcome: Progressing   Problem: Skin Integrity: Goal: Risk for impaired skin integrity will decrease Outcome: Progressing   Problem: Education: Goal: Knowledge of risk factors and measures for prevention of condition will improve Outcome: Progressing   Problem: Coping: Goal: Psychosocial and spiritual needs will be supported Outcome: Progressing   Problem: Respiratory: Goal: Will maintain a patent airway Outcome: Progressing   Problem: Education: Goal: Knowledge of disease and its progression will improve Outcome:  Progressing Goal: Individualized Educational Video(s) Outcome: Progressing   Problem: Fluid Volume: Goal: Compliance with measures to maintain balanced fluid volume will improve Outcome: Progressing   Problem: Health Behavior/Discharge Planning: Goal: Ability to manage health-related needs will improve Outcome: Progressing   Problem: Nutritional: Goal: Ability to make healthy dietary choices will improve Outcome: Progressing   Problem: Clinical Measurements: Goal: Complications related to the disease process, condition or treatment will be avoided or minimized Outcome: Progressing

## 2020-01-13 NOTE — Progress Notes (Signed)
Pt requested to not be woken for VS throughout night

## 2020-01-14 DIAGNOSIS — U071 COVID-19: Secondary | ICD-10-CM | POA: Diagnosis not present

## 2020-01-14 DIAGNOSIS — J1282 Pneumonia due to coronavirus disease 2019: Secondary | ICD-10-CM | POA: Diagnosis not present

## 2020-01-14 LAB — GLUCOSE, CAPILLARY
Glucose-Capillary: 117 mg/dL — ABNORMAL HIGH (ref 70–99)
Glucose-Capillary: 112 mg/dL — ABNORMAL HIGH (ref 70–99)
Glucose-Capillary: 139 mg/dL — ABNORMAL HIGH (ref 70–99)
Glucose-Capillary: 130 mg/dL — ABNORMAL HIGH (ref 70–99)

## 2020-01-14 NOTE — Progress Notes (Signed)
PT Cancellation Note  Patient Details Name: Hector [name] MRN: [id number] DOB: 09-08-1965   Cancelled Treatment:     Pt received asleep in bed but awakened enough to verbalize to PT but not open eyes. PT educates pt on reason for visit but pt states "I'm leaving tomorrow" & declines participation, reporting "I'm asleep". Will f/u as able & pt willing to participate.  Lavone Nian, PT, DPT 01/14/20, 11:19 AM    [name] 01/14/2020, 11:18 AM

## 2020-01-14 NOTE — Progress Notes (Signed)
PROGRESS NOTE    Hector [name]  JGG:[id number] DOB: 08/12/1965 DOA: 12/22/2019 PCP: [name], MD    Assessment & Plan:   Principal Problem:   Pneumonia due to COVID-19 virus Active Problems:   Hypertensive heart disease   Hyperlipidemia   Chronic kidney disease (CKD), stage III (moderate) (HCC)   Paroxysmal atrial fibrillation (HCC)   Chronic diastolic heart failure (HCC)   Essential hypertension   Nonischemic cardiomyopathy (HCC)   Chronic systolic heart failure (HCC)   Acute respiratory failure with hypoxemia (HCC)   Severe sepsis ([place])   COVID-19   Morbidly obese (HCC)   Acute respiratory distress syndrome (ARDS) due to COVID-19 virus (HCC)   Acute renal failure superimposed on stage 3b chronic kidney disease (HCC)   Hyperkalemia   Hyponatremia    [name] is a 54 year old male with past medical history of congestive heart failure chronic kidney disease hyperlipidemia paroxysmal atrial fibrillation and alcohol use who presented to the emergency room today with 2 to 3 weeks history of shortness of breath associated with dry cough and generalized weakness. In the emergency room, his oxygen saturation was 70%, he was placed on 100% nonbreather and admitted to the ICU.   #1. Acute hypoxemic respiratory failure, ARDS, COVID-19 pneumonia. -COVID -19 diagnosed on 12/22/2019 --completed steroids and remdesivir. PLAN: --cont baricitinib while inpatient --Continue supplemental O2 to keep sats >=92%, wean as tolerated-- currently 94% on room air.   #2 Acute kidney injury on chronic kidney disease stage IIIb metabolic acidosis, resolved --Nephrology consulted, now signed off. --cont to hold lasix, Entresto, per nephrology --creatinine at baseline 2.4  3. Chronic combined systolic and diastolic congestive heart failure. Stable clinically --cont to hold lasix and Entresto, per nephrology  4. Severe sepsis. Resolved  5. Paroxysmal  atrial fibrillation. Acquired thrombophilia  Continue Eliquis. --cont admiodraone and coreg  DM2 --A1c 7.7 --cont mealtime 3u TID --SSI  Hypothyroidism --cont Synthroid  HLD --cont Lipitor 80 mg daily  Left wrist/hand pain ?nerve impingement --[place] LUE negative for DVT --x-ray of the left wrist neg for fracture PLAN: --Voltaren gel BID --OT for ROM exercises  Hx of gout --cont allopurinol   DVT prophylaxis: LY:YTKPTWS Code Status: Full code  Family Communication:  Status is: inpatient Dispo:   The patient is from: home Anticipated d/c is to: SNF Anticipated d/c date is: whenever bed available Patient currently is medically stable to d/c.   Subjective and Interval History:  Pt reported his legs felt better, his left arm less pain, but his back felt stiff.  No dyspnea.   Objective: Vitals:   01/14/20 0441 01/14/20 0838 01/14/20 1207 01/14/20 1616  BP: 106/81 (!) 149/83 108/78 (!) 115/102  Pulse: 75 79 78 (!) 54  Resp: 20 18 18 18   Temp: 97.6 F (36.4 C) 98.6 F (37 C) 97.9 F (36.6 C) 98.2 F (36.8 C)  TempSrc:  Oral Oral Oral  SpO2: 97% 99% 97% (!) 88%  Weight:      Height:        Intake/Output Summary (Last 24 hours) at 01/14/2020 1827 Last data filed at 01/14/2020 1238 Gross per 24 hour  Intake --  Output 2100 ml  Net -2100 ml   Filed Weights   01/09/20 0451 01/10/20 0500 01/12/20 0415  Weight: 120.7 kg 121.2 kg 117.7 kg    Examination:   Constitutional: NAD, AAOx3 HEENT: conjunctivae and lids normal, EOMI CV: No cyanosis.   RESP: normal respiratory effort Extremities: No effusions, edema in  BLE.  Left hand holding a rolled up towel, can open 4th and 5th fingers better now SKIN: warm, dry and intact Neuro: II - XII grossly intact.     Data Reviewed: I have personally reviewed following labs and imaging studies  CBC: Recent Labs  Lab 01/09/20 0742 01/13/20 0605  WBC 14.7* 7.3  HGB 12.7* 12.5*  HCT 37.8* 38.0*  MCV 87.5 87.6   PLT 152 196   Basic Metabolic Panel: Recent Labs  Lab 01/08/20 0751 01/09/20 0742 01/11/20 0851 01/11/20 0914 01/13/20 0605  NA 133* 130*  --  134*  --   K 4.6 5.0  --  4.8  --   CL 102 98  --  99  --   CO2 24 23  --  23  --   GLUCOSE 106* 100*  --  98  --   BUN 50* 52*  --  50*  --   CREATININE 2.35* 2.43*  --  2.31* 2.28*  CALCIUM 8.7* 8.5*  --  8.7*  --   MG  --  1.7 2.0  --   --    GFR: Estimated Creatinine Clearance: 47.8 mL/min (A) (by C-G formula based on SCr of 2.28 mg/dL (H)). Liver Function Tests: No results for input(s): AST, ALT, ALKPHOS, BILITOT, PROT, ALBUMIN in the last 168 hours. No results for input(s): LIPASE, AMYLASE in the last 168 hours. No results for input(s): AMMONIA in the last 168 hours. Coagulation Profile: No results for input(s): INR, PROTIME in the last 168 hours. Cardiac Enzymes: No results for input(s): CKTOTAL, CKMB, CKMBINDEX, TROPONINI in the last 168 hours. BNP (last 3 results) No results for input(s): PROBNP in the last 8760 hours. HbA1C: No results for input(s): HGBA1C in the last 72 hours. CBG: Recent Labs  Lab 01/13/20 1545 01/13/20 2020 01/14/20 0838 01/14/20 1208 01/14/20 1618  GLUCAP 181* 128* 117* 112* 139*   Lipid Profile: No results for input(s): CHOL, HDL, LDLCALC, TRIG, CHOLHDL, LDLDIRECT in the last 72 hours. Thyroid Function Tests: No results for input(s): TSH, T4TOTAL, FREET4, T3FREE, THYROIDAB in the last 72 hours. Anemia Panel: No results for input(s): VITAMINB12, FOLATE, FERRITIN, TIBC, IRON, RETICCTPCT in the last 72 hours. Sepsis Labs: No results for input(s): PROCALCITON, LATICACIDVEN in the last 168 hours.  No results found for this or any previous visit (from the past 240 hour(s)).    Radiology Studies: No results found.   Scheduled Meds:  allopurinol  50 mg Oral Daily   amiodarone  200 mg Oral Daily   amLODipine  10 mg Oral Daily   apixaban  5 mg Oral BID   atorvastatin  80 mg Oral QHS    carvedilol  3.125 mg Oral BID WC   diclofenac Sodium  2 g Topical BID   feeding supplement (NEPRO CARB STEADY)  237 mL Oral BID BM   insulin aspart  0-20 Units Subcutaneous TID AC & HS   insulin aspart  3 Units Subcutaneous TID with meals   levothyroxine  100 mcg Oral QAC breakfast   multivitamin with minerals  1 tablet Oral Daily   traZODone  50 mg Oral QHS   Continuous Infusions:   LOS: 23 days     Enzo Bi, MD [facility] If 7PM-7AM, please contact night-coverage 01/14/2020, 6:27 PM

## 2020-01-15 DIAGNOSIS — U071 COVID-19: Secondary | ICD-10-CM | POA: Diagnosis not present

## 2020-01-15 DIAGNOSIS — J1282 Pneumonia due to coronavirus disease 2019: Secondary | ICD-10-CM | POA: Diagnosis not present

## 2020-01-15 LAB — GLUCOSE, CAPILLARY
Glucose-Capillary: 94 mg/dL (ref 70–99)
Glucose-Capillary: 105 mg/dL — ABNORMAL HIGH (ref 70–99)
Glucose-Capillary: 150 mg/dL — ABNORMAL HIGH (ref 70–99)
Glucose-Capillary: 101 mg/dL — ABNORMAL HIGH (ref 70–99)

## 2020-01-15 NOTE — Progress Notes (Signed)
Occupational Therapy Treatment Patient Details Name: Hector [name] MRN: [id number] DOB: 05-Mar-1965 Today's Date: 01/15/2020    History of present illness Pt is a 54 y/o M with PMH: CKD3, AFib, HLD, CHF, acohol abuse, and hypothyroidism who presented to ER secondary to progressive SOB, cough, weakness; admitted for management of acute respiratory failure with ARDS related to COVID-19.  Hospital course additionally significant for onset of L UE > LE numbness, coordination deficits; head CT negative, patient refusing MRI per chart.   OT comments  Hector [name] was seen for OT treatment on this date. Upon arrival to room pt asleep and awakes easily to voice. Pt requires encouragement and explanation to participate initially then asks "what do you want to do." Pt agreeable to bed level exercises. Completes bed level LUE exercises 1 set x 10 reps each: finger flexion/extension, single digit isolation, bicep flexion/extension, overhead press. MIN A self-drinking c LUE only at bed level. Pt making progress toward goals. Pt continues to benefit from skilled OT services to maximize return to PLOF and minimize risk of future falls, injury, caregiver burden, and readmission. Will continue to follow POC. Discharge recommendation remains appropriate.    Follow Up Recommendations  SNF    Equipment Recommendations  Other (comment) (defer to next venue of care)    Recommendations for Other Services      Precautions / Restrictions Precautions Precautions: Fall Restrictions Weight Bearing Restrictions: No       Mobility Bed Mobility    General bed mobility comments: Pt deferred  Transfers      General transfer comment: declined OOB attempts        ADL either performed or assessed with clinical judgement   ADL Overall ADL's : Needs assistance/impaired      General ADL Comments: MIN A self-drinking c LUE at bed level               Cognition Arousal/Alertness:  Awake/alert Behavior During Therapy: WFL for tasks assessed/performed Overall Cognitive Status: Within Functional Limits for tasks assessed      General Comments: Requires encouragement and explanation to participate initially then asks "what do you want to do"        Exercises Exercises: Other exercises Other Exercises Other Exercises: Bed level LUE exercises 1 set x 10 reps each: finger flexion/extension, single digit isolation, bicep flexion/extension, overhead press Other Exercises: Pt educated re: OT role, d/c recs, importance of mobility for functional strengthening           Pertinent Vitals/ Pain       Pain Assessment: No/denies pain         Frequency  Min 1X/week        Progress Toward Goals  OT Goals(current goals can now be found in the care plan section)  Progress towards OT goals: Progressing toward goals  Acute Rehab OT Goals Patient Stated Goal: to go to rehab and get strong. OT Goal Formulation: With patient Time For Goal Achievement: 01/23/20 Potential to Achieve Goals: Fair ADL Goals Pt Will Perform Grooming: with min assist;sitting (to perform 2-3 g/h tasks to increase seated fxl activity tol) Pt Will Perform Lower Body Dressing: with mod assist;sit to/from stand (with AE PRN) Pt Will Transfer to Toilet: with mod assist;stand pivot transfer;bedside commode (with LRAD ~5-10' to increase fxl mobility tolernace and ADL ) Pt Will Perform Toileting - Clothing Manipulation and hygiene: with mod assist;sit to/from stand Pt/caregiver will Perform Home Exercise Program: Increased ROM;Left upper extremity;Increased strength;Right Upper extremity;With minimal assist  Plan Discharge plan remains appropriate;Frequency remains appropriate       AM-PAC OT "6 Clicks" Daily Activity     Outcome Measure   Help from another person eating meals?: A Little Help from another person taking care of personal grooming?: A Little Help from another person toileting, which  includes using toliet, bedpan, or urinal?: A Lot Help from another person bathing (including washing, rinsing, drying)?: A Lot Help from another person to put on and taking off regular upper body clothing?: A Little Help from another person to put on and taking off regular lower body clothing?: A Lot 6 Click Score: 15    End of Session    OT Visit Diagnosis: Muscle weakness (generalized) (M62.81);Hemiplegia and hemiparesis Hemiplegia - Right/Left: Left Hemiplegia - caused by: Unspecified   Activity Tolerance Patient tolerated treatment well   Patient Left in bed;with call bell/phone within reach;with bed alarm set   Nurse Communication          Time: 7741-2878 OT Time Calculation (min): 9 min  Charges: OT General Charges $OT Visit: 1 Visit OT Treatments $Therapeutic Exercise: 8-22 mins  Dessie Coma, M.S. OTR/L  01/15/20, 12:46 PM  ascom [phone]

## 2020-01-15 NOTE — TOC Progression Note (Signed)
Transition of Care [facility]) - Progression Note    Patient Details  Name: Hector [name] MRN: [id number] Date of Birth: Nov 01, 1965  Transition of Care [facility]) CM/SW [place], LCSW Phone Number: 01/15/2020, 8:35 AM  Clinical Narrative:   CSW called [place] Admissions Worker [name], left voicemail requesting a return call with update on insurance authorization status.   Expected Discharge Plan: [place] Barriers to Discharge: Continued Medical Work up  Expected Discharge Plan and Services Expected Discharge Plan: [place] In-house Referral: NA   Post Acute Care Choice: [place] arrangements for the past 2 months: Single Family Home Expected Discharge Date: 01/03/20               DME Arranged: Oxygen DME Agency: Other - Comment [name]) Date DME Agency Contacted: 01/03/20 Time DME Agency Contacted: [phone] Representative spoke with at DME Agency: [place] ([place]) Interventions    Readmission Risk Interventions Readmission Risk Prevention Plan 01/02/2020  Transportation Screening Complete  PCP or Specialist Appt within 3-5 Days Complete  HRI or [place] Complete  Social Work Consult for [place] Planning/Counseling Complete  Palliative Care Screening Not Applicable  Medication Review [employer]) Complete  Some recent data might be hidden

## 2020-01-15 NOTE — Progress Notes (Signed)
PROGRESS NOTE    Hector PARISIEN  OVZ:[id number] DOB: 12-21-1965 DOA: 12/22/2019 PCP: [name], MD    Assessment & Plan:   Principal Problem:   Pneumonia due to COVID-19 virus Active Problems:   Hypertensive heart disease   Hyperlipidemia   Chronic kidney disease (CKD), stage III (moderate) (HCC)   Paroxysmal atrial fibrillation (HCC)   Chronic diastolic heart failure (HCC)   Essential hypertension   Nonischemic cardiomyopathy (HCC)   Chronic systolic heart failure (HCC)   Acute respiratory failure with hypoxemia (HCC)   Severe sepsis ([place])   COVID-19   Morbidly obese (HCC)   Acute respiratory distress syndrome (ARDS) due to COVID-19 virus (HCC)   Acute renal failure superimposed on stage 3b chronic kidney disease (HCC)   Hyperkalemia   Hyponatremia    [name] is a 54 year old male with past medical history of congestive heart failure chronic kidney disease hyperlipidemia paroxysmal atrial fibrillation and alcohol use who presented to the emergency room today with 2 to 3 weeks history of shortness of breath associated with dry cough and generalized weakness. In the emergency room, his oxygen saturation was 70%, he was placed on 100% nonbreather and admitted to the ICU.   #1. Acute hypoxemic respiratory failure, ARDS, COVID-19 pneumonia. -COVID -19 diagnosed on 12/22/2019 --completed steroids and remdesivir. PLAN: --cont baricitinib while inpatient --Continue supplemental O2 to keep sats >=92%, wean as tolerated-- currently 94% on room air.   #2 Acute kidney injury on chronic kidney disease stage IIIb metabolic acidosis, resolved --Nephrology consulted, now signed off. --cont to hold lasix, Entresto, per nephrology --creatinine at baseline 2.4  3. Chronic combined systolic and diastolic congestive heart failure. Stable clinically --cont to hold lasix and Entresto, per nephrology  4. Severe sepsis. Resolved  5. Paroxysmal  atrial fibrillation. Acquired thrombophilia  Continue Eliquis. --cont admiodraone and coreg  DM2 --A1c 7.7 --cont mealtime 3u TID --SSI  Hypothyroidism --cont Synthroid  HLD --cont Lipitor 80 mg daily  Left wrist/hand pain ?nerve impingement --[place] LUE negative for DVT --x-ray of the left wrist neg for fracture PLAN: --Voltaren gel BID --OT for ROM exercises  Hx of gout --cont allopurinol   DVT prophylaxis: AJ:OINOMVE Code Status: Full code  Family Communication:  Status is: inpatient Dispo:   The patient is from: home Anticipated d/c is to: SNF Anticipated d/c date is: whenever bed available Patient currently is medically stable to d/c.   Subjective and Interval History:  Pt reported not eating much due to not liking the food, but drank his Ensures.  Having BM's.  Less pain in his left hand, and able to open his left fingers fully now.   Objective: Vitals:   01/14/20 2000 01/15/20 0414 01/15/20 0830 01/15/20 1257  BP: 103/74 113/77 111/75 106/77  Pulse: 75 80 77 78  Resp: 20 20 20 17   Temp: 99 F (37.2 C) 98.3 F (36.8 C) 98.6 F (37 C) 98.1 F (36.7 C)  TempSrc: Oral Oral Oral   SpO2: 92% 93% 91% 92%  Weight:      Height:        Intake/Output Summary (Last 24 hours) at 01/15/2020 1614 Last data filed at 01/15/2020 1020 Gross per 24 hour  Intake 120 ml  Output 800 ml  Net -680 ml   Filed Weights   01/09/20 0451 01/10/20 0500 01/12/20 0415  Weight: 120.7 kg 121.2 kg 117.7 kg    Examination:  Constitutional: NAD, AAOx3 HEENT: conjunctivae and lids normal, EOMI CV: No cyanosis.  RESP: normal respiratory effort, on RA Extremities: No effusions, edema in BLE.  Left hand can fully extend now. SKIN: warm, dry and intact Neuro: II - XII grossly intact.      Data Reviewed: I have personally reviewed following labs and imaging studies  CBC: Recent Labs  Lab 01/09/20 0742 01/13/20 0605  WBC 14.7* 7.3  HGB 12.7* 12.5*  HCT 37.8* 38.0*   MCV 87.5 87.6  PLT 152 811   Basic Metabolic Panel: Recent Labs  Lab 01/09/20 0742 01/11/20 0851 01/11/20 0914 01/13/20 0605  NA 130*  --  134*  --   K 5.0  --  4.8  --   CL 98  --  99  --   CO2 23  --  23  --   GLUCOSE 100*  --  98  --   BUN 52*  --  50*  --   CREATININE 2.43*  --  2.31* 2.28*  CALCIUM 8.5*  --  8.7*  --   MG 1.7 2.0  --   --    GFR: Estimated Creatinine Clearance: 47.8 mL/min (A) (by C-G formula based on SCr of 2.28 mg/dL (H)). Liver Function Tests: No results for input(s): AST, ALT, ALKPHOS, BILITOT, PROT, ALBUMIN in the last 168 hours. No results for input(s): LIPASE, AMYLASE in the last 168 hours. No results for input(s): AMMONIA in the last 168 hours. Coagulation Profile: No results for input(s): INR, PROTIME in the last 168 hours. Cardiac Enzymes: No results for input(s): CKTOTAL, CKMB, CKMBINDEX, TROPONINI in the last 168 hours. BNP (last 3 results) No results for input(s): PROBNP in the last 8760 hours. HbA1C: No results for input(s): HGBA1C in the last 72 hours. CBG: Recent Labs  Lab 01/14/20 1208 01/14/20 1618 01/14/20 2102 01/15/20 0831 01/15/20 1255  GLUCAP 112* 139* 130* 94 105*   Lipid Profile: No results for input(s): CHOL, HDL, LDLCALC, TRIG, CHOLHDL, LDLDIRECT in the last 72 hours. Thyroid Function Tests: No results for input(s): TSH, T4TOTAL, FREET4, T3FREE, THYROIDAB in the last 72 hours. Anemia Panel: No results for input(s): VITAMINB12, FOLATE, FERRITIN, TIBC, IRON, RETICCTPCT in the last 72 hours. Sepsis Labs: No results for input(s): PROCALCITON, LATICACIDVEN in the last 168 hours.  No results found for this or any previous visit (from the past 240 hour(s)).    Radiology Studies: No results found.   Scheduled Meds: . allopurinol  50 mg Oral Daily  . amiodarone  200 mg Oral Daily  . amLODipine  10 mg Oral Daily  . apixaban  5 mg Oral BID  . atorvastatin  80 mg Oral QHS  . carvedilol  3.125 mg Oral BID WC  .  diclofenac Sodium  2 g Topical BID  . feeding supplement (NEPRO CARB STEADY)  237 mL Oral BID BM  . insulin aspart  0-20 Units Subcutaneous TID AC & HS  . insulin aspart  3 Units Subcutaneous TID with meals  . levothyroxine  100 mcg Oral QAC breakfast  . multivitamin with minerals  1 tablet Oral Daily  . traZODone  50 mg Oral QHS   Continuous Infusions:   LOS: 24 days     Enzo Bi, MD [facility] If 7PM-7AM, please contact night-coverage 01/15/2020, 4:14 PM

## 2020-01-16 DIAGNOSIS — U071 COVID-19: Secondary | ICD-10-CM | POA: Diagnosis not present

## 2020-01-16 DIAGNOSIS — J1282 Pneumonia due to coronavirus disease 2019: Secondary | ICD-10-CM | POA: Diagnosis not present

## 2020-01-16 LAB — GLUCOSE, CAPILLARY
Glucose-Capillary: 105 mg/dL — ABNORMAL HIGH (ref 70–99)
Glucose-Capillary: 118 mg/dL — ABNORMAL HIGH (ref 70–99)
Glucose-Capillary: 121 mg/dL — ABNORMAL HIGH (ref 70–99)
Glucose-Capillary: 98 mg/dL (ref 70–99)

## 2020-01-16 MED ORDER — NEPRO/CARBSTEADY PO LIQD
237.0000 mL | Freq: Three times a day (TID) | ORAL | Status: DC
  Administered 2020-01-16 – 2020-01-18 (×3): 237 mL via ORAL

## 2020-01-16 NOTE — Progress Notes (Signed)
Physical Therapy Treatment Patient Details Name: Hector [name] MRN: [id number] DOB: [birth year] Today's Date: 01/16/2020    History of Present Illness Pt is a 54 y/o M with PMH: CKD3, AFib, HLD, CHF, acohol abuse, and hypothyroidism who presented to ER secondary to progressive SOB, cough, weakness; admitted for management of acute respiratory failure with ARDS related to COVID-19.  Hospital course additionally significant for onset of L UE > LE numbness, coordination deficits; head CT negative, patient refusing MRI per chart.    PT Comments    Assisted back to bed per patient/nursing request, max/total assist +2.  Limited carry-over of transfer technique from previous session, but does attempt to assist with trunk lean, R hemi-body as able. Goals and plan of care from initial evaluation remain appropriate for patient;  Date extended to reflect continued care.    Follow Up Recommendations  SNF     Equipment Recommendations       Recommendations for Other Services       Precautions / Restrictions Precautions Precautions: Fall Restrictions Weight Bearing Restrictions: No    Mobility  Bed Mobility Overal bed mobility: Needs Assistance Bed Mobility: Supine to Sit     Supine to sit: Mod assist Sit to supine: Max assist;+2 for physical assistance   General bed mobility comments: poor dissociation of trunk and extremities  Transfers Overall transfer level: Needs assistance   Transfers: Lateral/Scoot Transfers          Lateral/Scoot Transfers: Total assist;+2 physical assistance General transfer comment: extensive assist for positioning, trunk lean and lateral movement. Limited active use/co-contraction of bilat LEs in closed-chain position with transfer attempts  Ambulation/Gait             General Gait Details: unsafe/unable   Stairs             Wheelchair Mobility    Modified Rankin (Stroke Patients Only)       Balance Overall balance  assessment: Needs assistance Sitting-balance support: No upper extremity supported;Feet supported Sitting balance-Leahy Scale: Fair Sitting balance - Comments: improved midline; minimal pushing behaviors noted today, but continues with delayed balance/righting reactions, increased sway in A/P plane                                    Cognition Arousal/Alertness: Awake/alert Behavior During Therapy: WFL for tasks assessed/performed Overall Cognitive Status: Within Functional Limits for tasks assessed                                 General Comments: Remains quick to report "I can't" and "I'm not going to", but redirectable with mod/max cuing      Exercises Other Exercises Other Exercises: Assisted with return to bed per patient/nursing request, max/dep +2 for lateral/scoot pivot transfer.  Limited carry-over of transfer technique from previous session. Other Exercises: Rolling bilat, min/mod assist, for peri-care/hygiene.  Very slow, guarded movement of LEs with functional activities.    General Comments        Pertinent Vitals/Pain Pain Assessment: No/denies pain    Home Living                      Prior Function            PT Goals (current goals can now be found in the care plan section) Acute Rehab PT Goals Patient Stated  Goal: to go to rehab and get strong. PT Goal Formulation: With patient Time For Goal Achievement: 01/30/20 Potential to Achieve Goals: Fair Progress towards PT goals: Progressing toward goals    Frequency    Min 2X/week      PT Plan Current plan remains appropriate    Co-evaluation              AM-PAC PT "6 Clicks" Mobility   Outcome Measure  Help needed turning from your back to your side while in a flat bed without using bedrails?: A Lot Help needed moving from lying on your back to sitting on the side of a flat bed without using bedrails?: Total Help needed moving to and from a bed to a chair  (including a wheelchair)?: Total Help needed standing up from a chair using your arms (e.g., wheelchair or bedside chair)?: Total Help needed to walk in hospital room?: Total Help needed climbing 3-5 steps with a railing? : Total 6 Click Score: 7    End of Session   Activity Tolerance: Patient tolerated treatment well Patient left: in bed;with call bell/phone within reach;with bed alarm set Nurse Communication: Mobility status PT Visit Diagnosis: Muscle weakness (generalized) (M62.81);Difficulty in walking, not elsewhere classified (R26.2)     Time: 6950-7225 PT Time Calculation (min) (ACUTE ONLY): 16 min  Charges:  $Therapeutic Activity: 8-22 mins                    Hector [name], PT, DPT, NCS 01/16/20, 2:21 PM [phone]

## 2020-01-16 NOTE — Progress Notes (Signed)
Nutrition Follow Up Note   DOCUMENTATION CODES:   Obesity unspecified  INTERVENTION:   Increase Nepro Shake to TID, each supplement provides 425 kcal and 19 grams protein  Add Magic cup TID with meals, each supplement provides 290 kcal and 9 grams of protein  MVI daily  Regular diet   NUTRITION DIAGNOSIS:   Increased nutrient needs related to catabolic illness (COVID 19) as evidenced by increased estimated needs  GOAL:   Patient will meet greater than or equal to 90% of their needs-progressing   MONITOR:   PO intake, Supplement acceptance, Labs, Weight trends, Skin, I & O's  ASSESSMENT:   54 y.o. black male with atrial fibrillation, hyperlipidemia, systolic congestive heart failure, alcohol abuse, hypothyroidism who was admitted to [facility] on 12/22/2019 for pneumonia due to COVID-19 virus   Spoke with RN, pt continues to have decreased appetite and oral intake; pt eating 25-60% of meals. Pt's breakfast tray from this morning had not been touched. Pt is drinking two Nepro supplements daily. Pt reports that he does not like the food despite being changed to a regular diet last week. Per chart, pt is down 13lbs(5%) since admit; this is significant. Spoke with MD regarding addition of appetite stimulant. MD would like to hold off on appetite stimulant at this time. RD will increase Nepro supplements to three times daily. RD will also add Magic Cups to meal trays.   Medications reviewed and include: allopurinol, insulin, synthroid, MVI  Labs reviewed: Na 134(L), BUN 50(H), creat 2.28(H) cbgs- 94, 105, 150, 101, 105 x 24 hrs  Diet Order:   Diet Order            Diet regular Room service appropriate? Yes; Fluid consistency: Thin  Diet effective now                EDUCATION NEEDS:   Not appropriate for education at this time  Skin:  Skin Assessment: Reviewed RN Assessment  Last BM:  11/20- type 6  Height:   Ht Readings from Last 1 Encounters:  12/30/19 5\' 11"  (1.803 m)     Weight:   Wt Readings from Last 1 Encounters:  01/12/20 117.7 kg    Ideal Body Weight:  78 kg  BMI:  Body mass index is 36.18 kg/m.  Estimated Nutritional Needs:   Kcal:  2700-3000kcal/day  Protein:  >135g/day  Fluid:  2.4-2.7L/day  [name] MS, RD, LDN Please refer to [facility] for RD and/or RD on-call/weekend/after hours pager

## 2020-01-16 NOTE — Progress Notes (Signed)
PROGRESS NOTE    Hector [name]  LOV:[id number] DOB: 1965-10-20 DOA: 12/22/2019 PCP: [name], MD    Assessment & Plan:   Principal Problem:   Pneumonia due to COVID-19 virus Active Problems:   Hypertensive heart disease   Hyperlipidemia   Chronic kidney disease (CKD), stage III (moderate) (HCC)   Paroxysmal atrial fibrillation (HCC)   Chronic diastolic heart failure (HCC)   Essential hypertension   Nonischemic cardiomyopathy (HCC)   Chronic systolic heart failure (HCC)   Acute respiratory failure with hypoxemia (HCC)   Severe sepsis ([place])   COVID-19   Morbidly obese (HCC)   Acute respiratory distress syndrome (ARDS) due to COVID-19 virus (HCC)   Acute renal failure superimposed on stage 3b chronic kidney disease (HCC)   Hyperkalemia   Hyponatremia    Hector [name] is a 54 year old male with past medical history of congestive heart failure chronic kidney disease hyperlipidemia paroxysmal atrial fibrillation and alcohol use who presented to the emergency room today with 2 to 3 weeks history of shortness of breath associated with dry cough and generalized weakness. In the emergency room, his oxygen saturation was 70%, he was placed on 100% nonbreather and admitted to the ICU.   #1. Acute hypoxemic respiratory failure, ARDS, COVID-19 pneumonia. -COVID -19 diagnosed on 12/22/2019 --completed steroids and remdesivir. PLAN: --cont baricitinib while inpatient --Continue supplemental O2 to keep sats >=92%, wean as tolerated-- currently 94% on room air.   #2 Acute kidney injury on chronic kidney disease stage IIIb metabolic acidosis, resolved --Nephrology consulted, now signed off. --cont to hold lasix, Entresto, per nephrology --creatinine at baseline 2.4  3. Chronic combined systolic and diastolic congestive heart failure. Stable clinically --cont to hold lasix and Entresto, per nephrology  4. Severe sepsis. Resolved  5. Paroxysmal  atrial fibrillation. Acquired thrombophilia  Continue Eliquis. --cont admiodraone and coreg  DM2 --A1c 7.7 --cont mealtime 3u TID --SSI  Hypothyroidism --cont Synthroid  HLD --cont Lipitor 80 mg daily  Left wrist/hand pain ?nerve impingement --[place] LUE negative for DVT --x-ray of the left wrist neg for fracture PLAN: --Voltaren gel BID --OT for ROM exercises  Hx of gout --cont allopurinol   DVT prophylaxis: OA:CZYSAYT Code Status: Full code  Family Communication:  Status is: inpatient Dispo:   The patient is from: home Anticipated d/c is to: SNF Anticipated d/c date is: whenever bed available.  Patient currently is medically stable to d/c.   Subjective and Interval History:  Pt mostly sleeps or rests in bed, but didn't get up to work with PT today.     Objective: Vitals:   01/15/20 1900 01/16/20 0000 01/16/20 0348 01/16/20 0739  BP: (!) 151/86 (!) 141/89 107/80 119/83  Pulse: 74 87 80 80  Resp: 18 18 16 18   Temp: 98.6 F (37 C) 98.2 F (36.8 C) 98.6 F (37 C) 98.2 F (36.8 C)  TempSrc:   Oral   SpO2: 93% 94% 91% 92%  Weight:      Height:        Intake/Output Summary (Last 24 hours) at 01/16/2020 1701 Last data filed at 01/16/2020 0739 Gross per 24 hour  Intake --  Output 1300 ml  Net -1300 ml   Filed Weights   01/09/20 0451 01/10/20 0500 01/12/20 0415  Weight: 120.7 kg 121.2 kg 117.7 kg    Examination:  Constitutional: NAD, resting comfortably in bed CV: No cyanosis.   RESP: normal respiratory effort, on RA Extremities: No effusions, edema in BLE SKIN: warm, dry  and intact    Data Reviewed: I have personally reviewed following labs and imaging studies  CBC: Recent Labs  Lab 01/13/20 0605  WBC 7.3  HGB 12.5*  HCT 38.0*  MCV 87.6  PLT 546   Basic Metabolic Panel: Recent Labs  Lab 01/11/20 0851 01/11/20 0914 01/13/20 0605  NA  --  134*  --   K  --  4.8  --   CL  --  99  --   CO2  --  23  --   GLUCOSE  --  98  --    BUN  --  50*  --   CREATININE  --  2.31* 2.28*  CALCIUM  --  8.7*  --   MG 2.0  --   --    GFR: Estimated Creatinine Clearance: 47.8 mL/min (A) (by C-G formula based on SCr of 2.28 mg/dL (H)). Liver Function Tests: No results for input(s): AST, ALT, ALKPHOS, BILITOT, PROT, ALBUMIN in the last 168 hours. No results for input(s): LIPASE, AMYLASE in the last 168 hours. No results for input(s): AMMONIA in the last 168 hours. Coagulation Profile: No results for input(s): INR, PROTIME in the last 168 hours. Cardiac Enzymes: No results for input(s): CKTOTAL, CKMB, CKMBINDEX, TROPONINI in the last 168 hours. BNP (last 3 results) No results for input(s): PROBNP in the last 8760 hours. HbA1C: No results for input(s): HGBA1C in the last 72 hours. CBG: Recent Labs  Lab 01/15/20 1255 01/15/20 1652 01/15/20 2122 01/16/20 0816 01/16/20 1140  GLUCAP 105* 150* 101* 105* 118*   Lipid Profile: No results for input(s): CHOL, HDL, LDLCALC, TRIG, CHOLHDL, LDLDIRECT in the last 72 hours. Thyroid Function Tests: No results for input(s): TSH, T4TOTAL, FREET4, T3FREE, THYROIDAB in the last 72 hours. Anemia Panel: No results for input(s): VITAMINB12, FOLATE, FERRITIN, TIBC, IRON, RETICCTPCT in the last 72 hours. Sepsis Labs: No results for input(s): PROCALCITON, LATICACIDVEN in the last 168 hours.  No results found for this or any previous visit (from the past 240 hour(s)).    Radiology Studies: No results found.   Scheduled Meds: . allopurinol  50 mg Oral Daily  . amiodarone  200 mg Oral Daily  . amLODipine  10 mg Oral Daily  . apixaban  5 mg Oral BID  . atorvastatin  80 mg Oral QHS  . carvedilol  3.125 mg Oral BID WC  . diclofenac Sodium  2 g Topical BID  . feeding supplement (NEPRO CARB STEADY)  237 mL Oral TID BM  . insulin aspart  0-20 Units Subcutaneous TID AC & HS  . insulin aspart  3 Units Subcutaneous TID with meals  . levothyroxine  100 mcg Oral QAC breakfast  . multivitamin  with minerals  1 tablet Oral Daily  . traZODone  50 mg Oral QHS   Continuous Infusions:   LOS: 25 days     Enzo Bi, MD [facility] If 7PM-7AM, please contact night-coverage 01/16/2020, 5:01 PM

## 2020-01-16 NOTE — TOC Progression Note (Addendum)
Transition of Care [facility]) - Progression Note    Patient Details  Name: Hector [name] MRN: [id number] Date of Birth: 08-Mar-1965  Transition of Care [employer]) CM/SW [name], LCSW Phone Number: 01/16/2020, 9:04 AM  Clinical Narrative:   CSW did not receive a return call from [facility] yesterday. Called [name] in Admissions again, her voicemail box was full. Called main line for facility. Left voicemail for office # for Admissions.  10:30- TOC Supervisor reaching out to [facility] for update.   Expected Discharge Plan: [place] Barriers to Discharge: Continued Medical Work up  Expected Discharge Plan and Services Expected Discharge Plan: [place] In-house Referral: NA   Post Acute Care Choice: [place] arrangements for the past 2 months: Single Family Home Expected Discharge Date: 01/03/20               DME Arranged: Oxygen DME Agency: Other - Comment [name]) Date DME Agency Contacted: 01/03/20 Time DME Agency Contacted: [phone] Representative spoke with at DME Agency: [facility] ([place]) Interventions    Readmission Risk Interventions Readmission Risk Prevention Plan 01/02/2020  Transportation Screening Complete  PCP or Specialist Appt within 3-5 Days Complete  HRI or [place] Complete  Social Work Consult for [name] Planning/Counseling Complete  Palliative Care Screening Not Applicable  Medication Review [employer]) Complete  Some recent data might be hidden

## 2020-01-16 NOTE — Progress Notes (Signed)
Physical Therapy Treatment Patient Details Name: Hector [name] MRN: [id number] DOB: May 07, 1965 Today's Date: 01/16/2020    History of Present Illness Pt is a 54 y/o M with PMH: CKD3, AFib, HLD, CHF, acohol abuse, and hypothyroidism who presented to ER secondary to progressive SOB, cough, weakness; admitted for management of acute respiratory failure with ARDS related to COVID-19.  Hospital course additionally significant for onset of L UE > LE numbness, coordination deficits; head CT negative, patient refusing MRI per chart.    PT Comments    Patient able to initiate/complete OOB transfers to recliner in room this date.  Unsafe/unable to attempt standing; however, agreeable to scoot pivot transfer over level surfaces, max/dep +2 for trunk control/weight shift, lift off and lateral movement.  Limited active use of LEs (L > R) with transfer, but did assist with trunk and R UE as able. Remains quick to report "I can't" and "I'm not going to", but redirectable with mod/max cuing throughout session.  Ultimately, pleased with transfer and ability to sit up in chair.  L UE propped on pillow and L UE placed in open position.    Follow Up Recommendations  SNF     Equipment Recommendations       Recommendations for Other Services       Precautions / Restrictions Precautions Precautions: Fall Restrictions Weight Bearing Restrictions: No    Mobility  Bed Mobility Overal bed mobility: Needs Assistance Bed Mobility: Supine to Sit     Supine to sit: Mod assist     General bed mobility comments: extensive assist for truncal elevation; limited active use of L UE with movement transition  Transfers     Transfers: Lateral/Scoot Transfers          Lateral/Scoot Transfers: Max assist;Total assist;+2 physical assistance General transfer comment: extensive assist for positioning, trunk lean and lateral movement. Limited active use/co-contraction of bilat LEs in closed-chain position  with transfer attempts  Ambulation/Gait             General Gait Details: unsafe/unable   Stairs             Wheelchair Mobility    Modified Rankin (Stroke Patients Only)       Balance Overall balance assessment: Needs assistance Sitting-balance support: No upper extremity supported;Feet supported Sitting balance-Leahy Scale: Fair Sitting balance - Comments: improved midline; minimal pushing behaviors noted today, but continues with delayed balance/righting reactions, increased sway in A/P plane                                    Cognition Arousal/Alertness: Awake/alert Behavior During Therapy: WFL for tasks assessed/performed Overall Cognitive Status: Within Functional Limits for tasks assessed                                 General Comments: Remains quick to report "I can't" and "I'm not going to", but redirectable with mod/max cuing      Exercises Other Exercises Other Exercises: L UE remains grossly 3-/5, moderately ataxic.  Patient prefers to maintain grasp around rolled washcloth outside of therapy (beginning to get some contracture L 4th and 5th digits); encouraged for open/neutral positioning as tolerated.  May benefit from L UE hand splint if agreeable    General Comments        Pertinent Vitals/Pain Pain Assessment: No/denies pain  Home Living                      Prior Function            PT Goals (current goals can now be found in the care plan section) Acute Rehab PT Goals Patient Stated Goal: to go to rehab and get strong. PT Goal Formulation: With patient Time For Goal Achievement: 01/30/20 Potential to Achieve Goals: Fair Progress towards PT goals: Progressing toward goals    Frequency    Min 2X/week      PT Plan Current plan remains appropriate    Co-evaluation              AM-PAC PT "6 Clicks" Mobility   Outcome Measure  Help needed turning from your back to your side  while in a flat bed without using bedrails?: A Little Help needed moving from lying on your back to sitting on the side of a flat bed without using bedrails?: A Lot Help needed moving to and from a bed to a chair (including a wheelchair)?: Total Help needed standing up from a chair using your arms (e.g., wheelchair or bedside chair)?: Total Help needed to walk in hospital room?: Total Help needed climbing 3-5 steps with a railing? : Total 6 Click Score: 9    End of Session   Activity Tolerance: Patient tolerated treatment well Patient left: in chair;with call bell/phone within reach;with chair alarm set Nurse Communication: Mobility status PT Visit Diagnosis: Muscle weakness (generalized) (M62.81);Difficulty in walking, not elsewhere classified (R26.2)     Time: 1150-1210 PT Time Calculation (min) (ACUTE ONLY): 20 min  Charges:  $Therapeutic Activity: 8-22 mins                     Azhane Eckart H. Owens Shark, PT, DPT, NCS 01/16/20, 1:32 PM [phone]

## 2020-01-16 NOTE — Plan of Care (Signed)
  Problem: Education: Goal: Knowledge of General Education information will improve Description: Including pain rating scale, medication(s)/side effects and non-pharmacologic comfort measures Outcome: Progressing   Problem: Health Behavior/Discharge Planning: Goal: Ability to manage health-related needs will improve Outcome: Progressing   Problem: Clinical Measurements: Goal: Ability to maintain clinical measurements within normal limits will improve Outcome: Progressing Goal: Will remain free from infection Outcome: Progressing Goal: Diagnostic test results will improve Outcome: Progressing Goal: Respiratory complications will improve Outcome: Progressing Goal: Cardiovascular complication will be avoided Outcome: Progressing   Problem: Activity: Goal: Risk for activity intolerance will decrease Outcome: Progressing   Problem: Nutrition: Goal: Adequate nutrition will be maintained Outcome: Progressing   Problem: Coping: Goal: Level of anxiety will decrease Outcome: Progressing   Problem: Elimination: Goal: Will not experience complications related to bowel motility Outcome: Progressing Goal: Will not experience complications related to urinary retention Outcome: Progressing   Problem: Pain Managment: Goal: General experience of comfort will improve Outcome: Progressing   Problem: Safety: Goal: Ability to remain free from injury will improve Outcome: Progressing   Problem: Skin Integrity: Goal: Risk for impaired skin integrity will decrease Outcome: Progressing   Problem: Education: Goal: Knowledge of risk factors and measures for prevention of condition will improve Outcome: Progressing   Problem: Coping: Goal: Psychosocial and spiritual needs will be supported Outcome: Progressing   Problem: Respiratory: Goal: Will maintain a patent airway Outcome: Progressing   Problem: Education: Goal: Knowledge of disease and its progression will improve Outcome:  Progressing Goal: Individualized Educational Video(s) Outcome: Progressing   Problem: Fluid Volume: Goal: Compliance with measures to maintain balanced fluid volume will improve Outcome: Progressing   Problem: Health Behavior/Discharge Planning: Goal: Ability to manage health-related needs will improve Outcome: Progressing   Problem: Nutritional: Goal: Ability to make healthy dietary choices will improve Outcome: Progressing   Problem: Clinical Measurements: Goal: Complications related to the disease process, condition or treatment will be avoided or minimized Outcome: Progressing

## 2020-01-17 DIAGNOSIS — U071 COVID-19: Secondary | ICD-10-CM | POA: Diagnosis not present

## 2020-01-17 DIAGNOSIS — J1282 Pneumonia due to coronavirus disease 2019: Secondary | ICD-10-CM | POA: Diagnosis not present

## 2020-01-17 LAB — GLUCOSE, CAPILLARY
Glucose-Capillary: 89 mg/dL (ref 70–99)
Glucose-Capillary: 123 mg/dL — ABNORMAL HIGH (ref 70–99)
Glucose-Capillary: 121 mg/dL — ABNORMAL HIGH (ref 70–99)
Glucose-Capillary: 99 mg/dL (ref 70–99)

## 2020-01-17 LAB — BASIC METABOLIC PANEL
Anion gap: 11 (ref 5–15)
BUN: 64 mg/dL — ABNORMAL HIGH (ref 6–20)
CO2: 24 mmol/L (ref 22–32)
Calcium: 9 mg/dL (ref 8.9–10.3)
Chloride: 99 mmol/L (ref 98–111)
Creatinine, Ser: 2.38 mg/dL — ABNORMAL HIGH (ref 0.61–1.24)
GFR, Estimated: 31 mL/min — ABNORMAL LOW (ref >60)
Glucose, Bld: 93 mg/dL (ref 70–99)
Potassium: 4.6 mmol/L (ref 3.5–5.1)
Sodium: 134 mmol/L — ABNORMAL LOW (ref 135–145)

## 2020-01-17 LAB — CBC
HCT: 37.7 % — ABNORMAL LOW (ref 39.0–52.0)
Hemoglobin: 12.3 g/dL — ABNORMAL LOW (ref 13.0–17.0)
MCH: 28.8 pg (ref 26.0–34.0)
MCHC: 32.6 g/dL (ref 30.0–36.0)
MCV: 88.3 fL (ref 80.0–100.0)
Platelets: 361 10*3/uL (ref 150–400)
RBC: 4.27 MIL/uL (ref 4.22–5.81)
RDW: 14.5 % (ref 11.5–15.5)
WBC: 7 10*3/uL (ref 4.0–10.5)
nRBC: 0 % (ref 0.0–0.2)

## 2020-01-17 NOTE — TOC Progression Note (Signed)
Transition of Care [facility]) - Progression Note    Patient Details  Name: Hector [name] MRN: [id number] Date of Birth: 12-06-1965  Transition of Care [name]) CM/SW [place], RN Phone Number: 01/17/2020, 4:19 PM  Clinical Narrative:    Hector [name] to Hector [name], corporate for [facility] regarding inability to speak with someone to confirm bed offer. Hector [name] confirmed Hector [name] was at capacity until Monday however Hector [name] was willing to offer bed. Hector [name] has started [facility] authorization and will accept patient once auth completed. Anticipating admission Thursday 01/18/20.    Expected Discharge Plan: [place] Barriers to Discharge: Continued Medical Work up  Expected Discharge Plan and Services Expected Discharge Plan: [place] In-house Referral: NA   Post Acute Care Choice: [place] arrangements for the past 2 months: Single Family Home Expected Discharge Date: 01/03/20               DME Arranged: Oxygen DME Agency: Other - Comment Hector [name]) Date DME Agency Contacted: 01/03/20 Time DME Agency Contacted: [phone] Representative spoke with at DME Agency: Hector [name] (Hector [name]) Interventions    Readmission Risk Interventions Readmission Risk Prevention Plan 01/02/2020  Transportation Screening Complete  PCP or Specialist Appt within 3-5 Days Complete  HRI or [place] Complete  Social Work Consult for [place] Planning/Counseling Complete  Palliative Care Screening Not Applicable  Medication Review Press photographer) Complete  Some recent data might be hidden

## 2020-01-17 NOTE — TOC Progression Note (Addendum)
Transition of Care [facility]) - Progression Note    Patient Details  Name: ROSALIE GELPI MRN: [id number] Date of Birth: [birth year]  Transition of Care [facility]) CM/SW [place], LCSW Phone Number: 01/17/2020, 10:27 AM  Clinical Narrative:   Attempted call to [facility] Admissions Worker [name], no answer, voicemail full.  Informed by RN that patient and his Mother are upset that he has not been moved to rehab yet. Attempted call to patient's mother, number not in service.  TOC Supervisor Manuela Schwartz found out [employer] never started Ship broker. Manuela Schwartz is now assisting with find disposition for patient.  4:00- Update from [place], patient to go to [facility]. Attempted call to patient room to inform him. Asked RN to tell patient. Updated TOC RNCM covering tomorrow.    Expected Discharge Plan: [place] Barriers to Discharge: Continued Medical Work up  Expected Discharge Plan and Services Expected Discharge Plan: [place] In-house Referral: NA   Post Acute Care Choice: [place] arrangements for the past 2 months: Single Family Home Expected Discharge Date: 01/03/20               DME Arranged: Oxygen DME Agency: Other - Comment [name]) Date DME Agency Contacted: 01/03/20 Time DME Agency Contacted: [phone] Representative spoke with at DME Agency: [place] ([place]) Interventions    Readmission Risk Interventions Readmission Risk Prevention Plan 01/02/2020  Transportation Screening Complete  PCP or Specialist Appt within 3-5 Days Complete  HRI or [place] Complete  Social Work Consult for [place] Planning/Counseling Complete  Palliative Care Screening Not Applicable  Medication Review Press photographer) Complete  Some recent data might be hidden

## 2020-01-17 NOTE — TOC Progression Note (Addendum)
Transition of Care [facility]) - Progression Note    Patient Details  Name: EVERETTE MALL MRN: [id number] Date of Birth: 1965-09-12  Transition of Care [facility]) CM/SW [place], RN Phone Number: 01/17/2020, 9:43 AM  Clinical Narrative:    Attempted to contact [employer] x2 @ [phone]. No answer and no option to leave voicemail.   Will discuss with patient need to move forward with another bed offer.   10:32am notified by LCSW that other bed offers had been rescinded. Writer contacted Medicaid @ [phone] and was transferred to [facility] who confirmed [place] had not started insurance authorization.    Expected Discharge Plan: [place] Barriers to Discharge: Continued Medical Work up  Expected Discharge Plan and Services Expected Discharge Plan: [place] In-house Referral: NA   Post Acute Care Choice: [place] arrangements for the past 2 months: Single Family Home Expected Discharge Date: 01/03/20               DME Arranged: Oxygen DME Agency: Other - Comment [name]) Date DME Agency Contacted: 01/03/20 Time DME Agency Contacted: [phone] Representative spoke with at DME Agency: [place] ([place]) Interventions    Readmission Risk Interventions Readmission Risk Prevention Plan 01/02/2020  Transportation Screening Complete  PCP or Specialist Appt within 3-5 Days Complete  HRI or [place] Complete  Social Work Consult for [place] Planning/Counseling Complete  Palliative Care Screening Not Applicable  Medication Review Press photographer) Complete  Some recent data might be hidden

## 2020-01-17 NOTE — Progress Notes (Signed)
PROGRESS NOTE    Hector [name]  MVH:[id number] DOB: November 08, 1965 DOA: 12/22/2019 PCP: [name], MD    Assessment & Plan:   Principal Problem:   Pneumonia due to COVID-19 virus Active Problems:   Hypertensive heart disease   Hyperlipidemia   Chronic kidney disease (CKD), stage III (moderate) (HCC)   Paroxysmal atrial fibrillation (HCC)   Chronic diastolic heart failure (HCC)   Essential hypertension   Nonischemic cardiomyopathy (HCC)   Chronic systolic heart failure (HCC)   Acute respiratory failure with hypoxemia (HCC)   Severe sepsis ([place])   COVID-19   Morbidly obese (HCC)   Acute respiratory distress syndrome (ARDS) due to COVID-19 virus (HCC)   Acute renal failure superimposed on stage 3b chronic kidney disease (HCC)   Hyperkalemia   Hyponatremia    [name] is a 54 year old male with past medical history of congestive heart failure chronic kidney disease hyperlipidemia paroxysmal atrial fibrillation and alcohol use who presented to the emergency room today with 2 to 3 weeks history of shortness of breath associated with dry cough and generalized weakness. In the emergency room, his oxygen saturation was 70%, he was placed on 100% nonbreather and admitted to the ICU.   #1. Acute hypoxemic respiratory failure, ARDS, COVID-19 pneumonia. -COVID -19 diagnosed on 12/22/2019 --completed steroids and remdesivir. PLAN: --cont baricitinib while inpatient --Continue supplemental O2 to keep sats >=92%, wean as tolerated-- currently 94% on room air.   #2 Acute kidney injury on chronic kidney disease stage IIIb metabolic acidosis, resolved --Nephrology consulted, now signed off. --cont to hold lasix, Entresto, per nephrology --creatinine at baseline 2.4  3. Chronic combined systolic and diastolic congestive heart failure. Stable clinically --cont to hold lasix and Entresto, per nephrology  4. Severe sepsis. Resolved  5. Paroxysmal  atrial fibrillation. Acquired thrombophilia  Continue Eliquis. --cont admiodraone and coreg  DM2 --A1c 7.7 --cont mealtime 3u TID --SSI  Hypothyroidism --cont Synthroid  HLD --cont Lipitor 80 mg daily  Left wrist/hand pain ?nerve impingement --[place] LUE negative for DVT --x-ray of the left wrist neg for fracture PLAN: --Voltaren gel BID --OT for ROM exercises  Hx of gout --cont allopurinol   DVT prophylaxis: [id number] Code Status: Full code  Family Communication:  Status is: inpatient Dispo:   The patient is from: home Anticipated d/c is to: SNF Anticipated d/c date is: whenever bed available.  Patient currently is medically stable to d/c.   Subjective and Interval History:  Pt complained again of the limited use of his left arm, but was reluctant to exercise it.  Complained that his left elbow was hot.  Mostly just sleeping in bed.    Objective: Vitals:   01/17/20 0024 01/17/20 0518 01/17/20 0814 01/17/20 1107  BP: 116/81 119/86 (!) 137/98 124/86  Pulse: 81 85 88 85  Resp: 18 20 20 20   Temp: 98.9 F (37.2 C) 98.2 F (36.8 C) 98.7 F (37.1 C) 98.9 F (37.2 C)  TempSrc: Oral Oral Oral Oral  SpO2: 92% 92% 93% 91%  Weight:      Height:        Intake/Output Summary (Last 24 hours) at 01/17/2020 1524 Last data filed at 01/17/2020 0520 Gross per 24 hour  Intake --  Output 900 ml  Net -900 ml   Filed Weights   01/09/20 0451 01/10/20 0500 01/12/20 0415  Weight: 120.7 kg 121.2 kg 117.7 kg    Examination:  Constitutional: NAD, AAOx3 HEENT: conjunctivae and lids normal, EOMI CV: No cyanosis.  RESP: normal respiratory effort, on RA Extremities: No effusions, edema in BLE.  Left elbow without edema, erythema or excess warmth SKIN: warm, dry and intact Neuro: II - XII grossly intact.      Data Reviewed: I have personally reviewed following labs and imaging studies  CBC: Recent Labs  Lab 01/13/20 0605 01/17/20 0603  WBC 7.3 7.0  HGB 12.5*  12.3*  HCT 38.0* 37.7*  MCV 87.6 88.3  PLT 200 355   Basic Metabolic Panel: Recent Labs  Lab 01/11/20 0851 01/11/20 0914 01/13/20 0605 01/17/20 0603  NA  --  134*  --  134*  K  --  4.8  --  4.6  CL  --  99  --  99  CO2  --  23  --  24  GLUCOSE  --  98  --  93  BUN  --  50*  --  64*  CREATININE  --  2.31* 2.28* 2.38*  CALCIUM  --  8.7*  --  9.0  MG 2.0  --   --   --    GFR: Estimated Creatinine Clearance: 45.8 mL/min (A) (by C-G formula based on SCr of 2.38 mg/dL (H)). Liver Function Tests: No results for input(s): AST, ALT, ALKPHOS, BILITOT, PROT, ALBUMIN in the last 168 hours. No results for input(s): LIPASE, AMYLASE in the last 168 hours. No results for input(s): AMMONIA in the last 168 hours. Coagulation Profile: No results for input(s): INR, PROTIME in the last 168 hours. Cardiac Enzymes: No results for input(s): CKTOTAL, CKMB, CKMBINDEX, TROPONINI in the last 168 hours. BNP (last 3 results) No results for input(s): PROBNP in the last 8760 hours. HbA1C: No results for input(s): HGBA1C in the last 72 hours. CBG: Recent Labs  Lab 01/16/20 1140 01/16/20 1719 01/16/20 2128 01/17/20 0817 01/17/20 1108  GLUCAP 118* 121* 98 89 123*   Lipid Profile: No results for input(s): CHOL, HDL, LDLCALC, TRIG, CHOLHDL, LDLDIRECT in the last 72 hours. Thyroid Function Tests: No results for input(s): TSH, T4TOTAL, FREET4, T3FREE, THYROIDAB in the last 72 hours. Anemia Panel: No results for input(s): VITAMINB12, FOLATE, FERRITIN, TIBC, IRON, RETICCTPCT in the last 72 hours. Sepsis Labs: No results for input(s): PROCALCITON, LATICACIDVEN in the last 168 hours.  No results found for this or any previous visit (from the past 240 hour(s)).    Radiology Studies: No results found.   Scheduled Meds: . allopurinol  50 mg Oral Daily  . amiodarone  200 mg Oral Daily  . amLODipine  10 mg Oral Daily  . apixaban  5 mg Oral BID  . atorvastatin  80 mg Oral QHS  . carvedilol  3.125  mg Oral BID WC  . diclofenac Sodium  2 g Topical BID  . feeding supplement (NEPRO CARB STEADY)  237 mL Oral TID BM  . insulin aspart  0-20 Units Subcutaneous TID AC & HS  . insulin aspart  3 Units Subcutaneous TID with meals  . levothyroxine  100 mcg Oral QAC breakfast  . multivitamin with minerals  1 tablet Oral Daily  . traZODone  50 mg Oral QHS   Continuous Infusions:   LOS: 26 days     Enzo Bi, MD [facility] If 7PM-7AM, please contact night-coverage 01/17/2020, 3:24 PM

## 2020-01-18 DIAGNOSIS — U071 COVID-19: Secondary | ICD-10-CM | POA: Diagnosis not present

## 2020-01-18 DIAGNOSIS — J1282 Pneumonia due to coronavirus disease 2019: Secondary | ICD-10-CM | POA: Diagnosis not present

## 2020-01-18 LAB — GLUCOSE, CAPILLARY
Glucose-Capillary: 85 mg/dL (ref 70–99)
Glucose-Capillary: 99 mg/dL (ref 70–99)
Glucose-Capillary: 118 mg/dL — ABNORMAL HIGH (ref 70–99)
Glucose-Capillary: 110 mg/dL — ABNORMAL HIGH (ref 70–99)

## 2020-01-18 NOTE — Plan of Care (Signed)
  Problem: Education: Goal: Knowledge of General Education information will improve Description: Including pain rating scale, medication(s)/side effects and non-pharmacologic comfort measures Outcome: Progressing   Problem: Health Behavior/Discharge Planning: Goal: Ability to manage health-related needs will improve Outcome: Progressing   Problem: Clinical Measurements: Goal: Ability to maintain clinical measurements within normal limits will improve Outcome: Progressing Goal: Will remain free from infection Outcome: Progressing Goal: Diagnostic test results will improve Outcome: Progressing Goal: Respiratory complications will improve Outcome: Progressing Goal: Cardiovascular complication will be avoided Outcome: Progressing   Problem: Activity: Goal: Risk for activity intolerance will decrease Outcome: Progressing   Problem: Nutrition: Goal: Adequate nutrition will be maintained Outcome: Progressing   Problem: Coping: Goal: Level of anxiety will decrease Outcome: Progressing   Problem: Elimination: Goal: Will not experience complications related to bowel motility Outcome: Progressing Goal: Will not experience complications related to urinary retention Outcome: Progressing   Problem: Pain Managment: Goal: General experience of comfort will improve Outcome: Progressing   Problem: Safety: Goal: Ability to remain free from injury will improve Outcome: Progressing   Problem: Skin Integrity: Goal: Risk for impaired skin integrity will decrease Outcome: Progressing   Problem: Education: Goal: Knowledge of risk factors and measures for prevention of condition will improve Outcome: Progressing   Problem: Coping: Goal: Psychosocial and spiritual needs will be supported Outcome: Progressing   Problem: Respiratory: Goal: Will maintain a patent airway Outcome: Progressing   Problem: Education: Goal: Knowledge of disease and its progression will improve Outcome:  Progressing Goal: Individualized Educational Video(s) Outcome: Progressing   Problem: Fluid Volume: Goal: Compliance with measures to maintain balanced fluid volume will improve Outcome: Progressing   Problem: Health Behavior/Discharge Planning: Goal: Ability to manage health-related needs will improve Outcome: Progressing   Problem: Nutritional: Goal: Ability to make healthy dietary choices will improve Outcome: Progressing   Problem: Clinical Measurements: Goal: Complications related to the disease process, condition or treatment will be avoided or minimized Outcome: Progressing

## 2020-01-18 NOTE — TOC Progression Note (Signed)
Transition of Care [facility]) - Progression Note    Patient Details  Name: Hector [name] MRN: [id number] Date of Birth: March 02, 1965  Transition of Care [facility]) CM/SW Contact  Shelbie Ammons, RN Phone Number: 01/18/2020, 8:54 AM  Clinical Narrative:  RNCM reached out to [facility] to follow up on possible Medicaid authorization approval. No answer to phone call and no option to leave voicemail.    Expected Discharge Plan: [place] Barriers to Discharge: Continued Medical Work up  Expected Discharge Plan and Services Expected Discharge Plan: [name] In-house Referral: NA   Post Acute Care Choice: [place] arrangements for the past 2 months: Single Family Home Expected Discharge Date: 01/03/20               DME Arranged: Oxygen DME Agency: Other - Comment [name]) Date DME Agency Contacted: 01/03/20 Time DME Agency Contacted: [phone] Representative spoke with at DME Agency: [place] ([place] Interventions    Readmission Risk Interventions Readmission Risk Prevention Plan 01/02/2020  Transportation Screening Complete  PCP or Specialist Appt within 3-5 Days Complete  HRI or [place] Complete  Social Work Consult for [place] Planning/Counseling Complete  Palliative Care Screening Not Applicable  Medication Review Press photographer) Complete  Some recent data might be hidden

## 2020-01-18 NOTE — Progress Notes (Signed)
PROGRESS NOTE    Hector [name]  ZOX:[id number] DOB: [birth year] DOA: 12/22/2019 PCP: [name], MD    Assessment & Plan:   Principal Problem:   Pneumonia due to COVID-19 virus Active Problems:   Hypertensive heart disease   Hyperlipidemia   Chronic kidney disease (CKD), stage III (moderate) (HCC)   Paroxysmal atrial fibrillation (HCC)   Chronic diastolic heart failure (HCC)   Essential hypertension   Nonischemic cardiomyopathy (HCC)   Chronic systolic heart failure (HCC)   Acute respiratory failure with hypoxemia (HCC)   Severe sepsis ([place])   COVID-19   Morbidly obese (HCC)   Acute respiratory distress syndrome (ARDS) due to COVID-19 virus (HCC)   Acute renal failure superimposed on stage 3b chronic kidney disease (HCC)   Hyperkalemia   Hyponatremia    [name] is a 54 year old male with past medical history of congestive heart failure chronic kidney disease hyperlipidemia paroxysmal atrial fibrillation and alcohol use who presented to the emergency room today with 2 to 3 weeks history of shortness of breath associated with dry cough and generalized weakness. In the emergency room, his oxygen saturation was 70%, he was placed on 100% nonbreather and admitted to the ICU.   #1. Acute hypoxemic respiratory failure, ARDS, COVID-19 pneumonia. -COVID -19 diagnosed on 12/22/2019 --completed steroids and remdesivir. PLAN: --cont baricitinib while inpatient --Continue supplemental O2 to keep sats >=92%, wean as tolerated-- currently 94% on room air.   #2 Acute kidney injury on chronic kidney disease stage IIIb metabolic acidosis, resolved --Nephrology consulted, now signed off. --cont to hold lasix, Entresto, per nephrology --creatinine at baseline 2.4  3. Chronic combined systolic and diastolic congestive heart failure. Stable clinically --cont to hold lasix and Entresto, per nephrology  4. Severe sepsis. Resolved  5. Paroxysmal  atrial fibrillation. Acquired thrombophilia  Continue Eliquis. --cont admiodraone and coreg  DM2 --A1c 7.7 --cont mealtime 3u TID --SSI  Hypothyroidism --cont Synthroid  HLD --cont Lipitor 80 mg daily  Left wrist/hand pain ?nerve impingement --[place] LUE negative for DVT --x-ray of the left wrist neg for fracture PLAN: --Voltaren gel BID --OT for ROM exercises  Hx of gout --cont allopurinol   DVT prophylaxis: [id number] Code Status: Full code  Family Communication:  Status is: inpatient Dispo:   The patient is from: home Anticipated d/c is to: SNF Anticipated d/c date is: whenever bed available.  Patient currently is medically stable to d/c.   Subjective and Interval History:  Pt ate pizza for lunch, said he doesn't eat [place].  Able to sleep a lot.   Objective: Vitals:   01/17/20 2337 01/18/20 0341 01/18/20 0835 01/18/20 1104  BP: 134/86 114/81 118/81 105/77  Pulse: 85 81 88 90  Resp: 18 18 20    Temp: 98 F (36.7 C) 97.6 F (36.4 C) 98.1 F (36.7 C)   TempSrc: Oral Axillary Oral   SpO2: 90% 93% 92%   Weight:      Height:        Intake/Output Summary (Last 24 hours) at 01/18/2020 1409 Last data filed at 01/18/2020 1114 Gross per 24 hour  Intake --  Output 700 ml  Net -700 ml   Filed Weights   01/09/20 0451 01/10/20 0500 01/12/20 0415  Weight: 120.7 kg 121.2 kg 117.7 kg    Examination:   Constitutional: NAD, AAOx3 HEENT: conjunctivae and lids normal, EOMI CV: No cyanosis.   RESP: normal respiratory effort, on RA Extremities: No effusions, edema in BLE.  Left hand holding a towel,  4th and 5th fingers flexed.   SKIN: warm, dry and intact Neuro: II - XII grossly intact.      Data Reviewed: I have personally reviewed following labs and imaging studies  CBC: Recent Labs  Lab 01/13/20 0605 01/17/20 0603  WBC 7.3 7.0  HGB 12.5* 12.3*  HCT 38.0* 37.7*  MCV 87.6 88.3  PLT 200 163   Basic Metabolic Panel: Recent Labs  Lab  01/13/20 0605 01/17/20 0603  NA  --  134*  K  --  4.6  CL  --  99  CO2  --  24  GLUCOSE  --  93  BUN  --  64*  CREATININE 2.28* 2.38*  CALCIUM  --  9.0   GFR: Estimated Creatinine Clearance: 45.8 mL/min (A) (by C-G formula based on SCr of 2.38 mg/dL (H)). Liver Function Tests: No results for input(s): AST, ALT, ALKPHOS, BILITOT, PROT, ALBUMIN in the last 168 hours. No results for input(s): LIPASE, AMYLASE in the last 168 hours. No results for input(s): AMMONIA in the last 168 hours. Coagulation Profile: No results for input(s): INR, PROTIME in the last 168 hours. Cardiac Enzymes: No results for input(s): CKTOTAL, CKMB, CKMBINDEX, TROPONINI in the last 168 hours. BNP (last 3 results) No results for input(s): PROBNP in the last 8760 hours. HbA1C: No results for input(s): HGBA1C in the last 72 hours. CBG: Recent Labs  Lab 01/17/20 1108 01/17/20 1647 01/17/20 2201 01/18/20 0806 01/18/20 1142  GLUCAP 123* 121* 99 85 99   Lipid Profile: No results for input(s): CHOL, HDL, LDLCALC, TRIG, CHOLHDL, LDLDIRECT in the last 72 hours. Thyroid Function Tests: No results for input(s): TSH, T4TOTAL, FREET4, T3FREE, THYROIDAB in the last 72 hours. Anemia Panel: No results for input(s): VITAMINB12, FOLATE, FERRITIN, TIBC, IRON, RETICCTPCT in the last 72 hours. Sepsis Labs: No results for input(s): PROCALCITON, LATICACIDVEN in the last 168 hours.  No results found for this or any previous visit (from the past 240 hour(s)).    Radiology Studies: No results found.   Scheduled Meds: . allopurinol  50 mg Oral Daily  . amiodarone  200 mg Oral Daily  . amLODipine  10 mg Oral Daily  . apixaban  5 mg Oral BID  . atorvastatin  80 mg Oral QHS  . carvedilol  3.125 mg Oral BID WC  . diclofenac Sodium  2 g Topical BID  . feeding supplement (NEPRO CARB STEADY)  237 mL Oral TID BM  . insulin aspart  0-20 Units Subcutaneous TID AC & HS  . insulin aspart  3 Units Subcutaneous TID with meals   . levothyroxine  100 mcg Oral QAC breakfast  . multivitamin with minerals  1 tablet Oral Daily  . traZODone  50 mg Oral QHS   Continuous Infusions:   LOS: 27 days     Enzo Bi, MD [facility] If 7PM-7AM, please contact night-coverage 01/18/2020, 2:09 PM

## 2020-01-19 DIAGNOSIS — U071 COVID-19: Secondary | ICD-10-CM | POA: Diagnosis not present

## 2020-01-19 DIAGNOSIS — J1282 Pneumonia due to coronavirus disease 2019: Secondary | ICD-10-CM | POA: Diagnosis not present

## 2020-01-19 LAB — GLUCOSE, CAPILLARY: Glucose-Capillary: 98 mg/dL (ref 70–99)

## 2020-01-19 MED ORDER — DICLOFENAC SODIUM 1 % EX GEL: 2.0000 g | Freq: Two times a day (BID) | CUTANEOUS | Status: DC

## 2020-01-19 MED ORDER — ONDANSETRON HCL 4 MG/2ML IJ SOLN: 4.0000 mg | Freq: Four times a day (QID) | INTRAMUSCULAR | Status: DC | PRN

## 2020-01-19 MED ORDER — NEPRO/CARBSTEADY PO LIQD
237.0000 mL | Freq: Three times a day (TID) | ORAL | 0 refills | Status: DC
Start: 2020-01-19 — End: 2020-09-16

## 2020-01-19 MED ORDER — ADULT MULTIVITAMIN W/MINERALS CH
1.0000 | ORAL_TABLET | Freq: Every day | ORAL | Status: DC
Start: 2020-01-20 — End: 2020-04-05

## 2020-01-19 MED ORDER — ONDANSETRON 4 MG PO TBDP
4.0000 mg | ORAL_TABLET | Freq: Three times a day (TID) | ORAL | Status: DC | PRN
  Administered 2020-01-19: 4 mg via ORAL
  Filled 2020-01-19 (×3): qty 1

## 2020-01-19 MED ORDER — AMLODIPINE BESYLATE 10 MG PO TABS
10.0000 mg | ORAL_TABLET | Freq: Every day | ORAL | Status: DC
Start: 2020-01-20 — End: 2020-04-05

## 2020-01-19 NOTE — Progress Notes (Signed)
Called report to Gunnar Bulla LPN at [facility]   [phone]

## 2020-01-19 NOTE — Discharge Summary (Signed)
Physician Discharge Summary   Hector [name]  male DOB: [birth year]  YPP:[id number]  PCP: [name], MD  Admit date: 12/22/2019 Discharge date: 01/19/2020  Admitted From: home Disposition:  SNF CODE STATUS: Full code   Hospital Course:  For full details, please see H&P, progress notes, consult notes and ancillary notes.  Briefly,  Hector [name] is a 54 year old male with past medical history of congestive heart failure, chronic kidney disease, hyperlipidemia, paroxysmal atrial fibrillation, and alcohol use who presented to the emergency room today with 2 to 3 weeks history of shortness of breath associated with dry cough and generalized weakness. In the emergency room, his oxygen saturation was 70%, he was placed on 100% nonbreather and admitted to the ICU.   Acute hypoxemic respiratory failure, ARDS, COVID-19 pneumonia. COVID -19 diagnosed on 12/22/2019.  Pt completed steroids, remdesivir and baricitinib while inpatient.  Pt was weaned down to room air prior to discharge.  Acute kidney injury on chronic kidney disease stage IIIb Metabolic acidosis, resolved Nephrology was consulted, and rec holding Entresto until outpatient cardiology followup, and Lasix only as PRN for fluid or edema.  creatinine around baseline 2.4.  Chronic combined systolic and diastolic congestive heart failure. Stable clinically.  Per nephrology rec, Delene Loll held due to AKI/CKD until outpatient cardiology followup, and Lasix only as PRN for fluid or edema.  Pt appeared euvolemic without both during his hospitalization.  Severe sepsis, POA, Resolved  Paroxysmal atrial fibrillation. Acquired thrombophilia  Continued Eliquis, admiodraone and coreg  DM2 A1c 7.7.  Pt was receiving mealtime 3u TID and SSI during most of his hospitalization, but declined further insulin right before discharge.  Hypothyroidism Continued Synthroid  HLD Continued Lipitor 80 mg  daily  Left wrist and hand pain likely due to nerve impingement [place] LUE negative for DVT.  X-ray of the left wrist neg for fracture.  Left elbow intermittently tender to palpation but without erythema or edema.  Pt also had difficulty extending his left 4th and 5th finger.  Pt received OT for ROM exercises, and Voltaren gel BID.  Hx of gout Continued allopurinol   Discharge Diagnoses:  Principal Problem:   Pneumonia due to COVID-19 virus Active Problems:   Hypertensive heart disease   Hyperlipidemia   Chronic kidney disease (CKD), stage III (moderate) (HCC)   Paroxysmal atrial fibrillation (HCC)   Chronic diastolic heart failure (HCC)   Essential hypertension   Nonischemic cardiomyopathy (HCC)   Chronic systolic heart failure (HCC)   Acute respiratory failure with hypoxemia (HCC)   Severe sepsis ([place])   COVID-19   Morbidly obese (HCC)   Acute respiratory distress syndrome (ARDS) due to COVID-19 virus (HCC)   Acute renal failure superimposed on stage 3b chronic kidney disease (HCC)   Hyperkalemia   Hyponatremia    Discharge Instructions:  Allergies as of 01/19/2020      Reactions   Esomeprazole Magnesium Other (See Comments)   Suspected interstitial nephritis 2018   Eggs Or Egg-derived Products Rash      Medication List    STOP taking these medications   HYDROcodone-acetaminophen 5-325 MG tablet Commonly known as: NORCO/VICODIN   predniSONE 20 MG tablet Commonly known as: DELTASONE     TAKE these medications   allopurinol 100 MG tablet Commonly known as: ZYLOPRIM Take 100 mg by mouth daily as needed.   amiodarone 200 MG tablet Commonly known as: PACERONE Take 1 tablet (200 mg total) by mouth daily.   amLODipine 10 MG tablet Commonly known  as: NORVASC Take 1 tablet (10 mg total) by mouth daily. Start taking on: January 20, 2020   apixaban 5 MG Tabs tablet Commonly known as: ELIQUIS Take 1 tablet (5 mg total) by mouth 2 (two) times daily.    atorvastatin 80 MG tablet Commonly known as: LIPITOR Take 1 tablet (80 mg total) by mouth daily.   carvedilol 3.125 MG tablet Commonly known as: COREG Take 1 tablet (3.125 mg total) by mouth 2 (two) times daily with a meal.   diclofenac Sodium 1 % Gel Commonly known as: VOLTAREN Apply 2 g topically 2 (two) times daily. To left elbow, left forearm.   Entresto 97-103 MG Generic drug: sacubitril-valsartan Hold until outpatient followup with cardiology due to acute kidney injury. What changed:   how much to take  how to take this  when to take this  additional instructions   feeding supplement (NEPRO CARB STEADY) Liqd Take 237 mLs by mouth 3 (three) times daily between meals.   furosemide 40 MG tablet Commonly known as: LASIX Take 1 tablet (40 mg total) by mouth daily as needed for fluid or edema. Per nephrology recommendation. What changed:   when to take this  reasons to take this  additional instructions   levothyroxine 100 MCG tablet Commonly known as: SYNTHROID Take 100 mcg by mouth daily before breakfast.   multivitamin with minerals Tabs tablet Take 1 tablet by mouth daily. Start taking on: January 20, 2020            Durable Medical Equipment  (From admission, onward)         Start     Ordered   01/03/20 1054  For home use only DME standard manual wheelchair with seat cushion  Once       Comments: Patient suffers from severe weakness and hypoxia with standing which impairs their ability to perform daily activities in the home.  A walker will not resolve issue with performing activities of daily living. A wheelchair will allow patient to safely perform daily activities. Patient can safely propel the wheelchair in the home or has a caregiver who can provide assistance. Length of need 6 months . Accessories: elevating leg rests (ELRs), wheel locks, extensions and anti-tippers.   01/03/20 1053   01/03/20 1035  For home use only DME oxygen  Once        Question Answer Comment  Length of Need 6 Months   Mode or (Route) Nasal cannula   Liters per Minute 2   Frequency Continuous (stationary and portable oxygen unit needed)   Oxygen delivery system Gas      01/03/20 1034           Contact information for follow-up providers    [facility] up on 01/23/2020.   Specialty: Cardiology Why: at 10:30am. Enter through the [place] entrance Contact information: [place] [phone]       Your outpatient nephrologist. Schedule an appointment as soon as possible for a visit in 2 week(s).        [name], MD. Schedule an appointment as soon as possible for a visit in 1 week(s).   Specialty: Family Medicine Contact information: [place] [id number] [phone]            Contact information for after-discharge care    Destination    [facility] .   Service: Skilled Nursing Contact  information: [place] [address] [phone]                  Allergies  Allergen Reactions  . Esomeprazole Magnesium Other (See Comments)    Suspected interstitial nephritis 2018  . Eggs Or Egg-Derived Products Rash     The results of significant diagnostics from this hospitalization (including imaging, microbiology, ancillary and laboratory) are listed below for reference.   Consultations:   Procedures/Studies: CT HEAD WO CONTRAST  Result Date: 12/23/2019 CLINICAL DATA:  Neurologic deficit. EXAM: CT HEAD WITHOUT CONTRAST TECHNIQUE: Contiguous axial images were obtained from the base of the skull through the vertex without intravenous contrast. COMPARISON:  CT maxillofacial dated 12/09/2005. FINDINGS: Brain: No evidence of acute infarction, hemorrhage, hydrocephalus, extra-axial collection or mass lesion/mass effect. There is mild cerebral  volume loss with associated ex vacuo dilatation. Periventricular white matter hypoattenuation likely represents chronic small vessel ischemic disease. Vascular: There are vascular calcifications in the carotid siphons. Skull: Chronic, healed right facial bone fractures are partially imaged. Negative for acute fracture or focal lesion. Sinuses/Orbits: Calcified, inspissated mucus in the right maxillary sinus is redemonstrated. Other: None. IMPRESSION: No acute intracranial process. Electronically Signed   By: Zerita Boers M.D.   On: 12/23/2019 20:16   US RENAL  Result Date: 12/23/2019 CLINICAL DATA:  Acute renal failure. EXAM: RENAL / URINARY TRACT ULTRASOUND COMPLETE COMPARISON:  None. FINDINGS: Right Kidney: Renal measurements: 12.5 x 5.0 x 5.3 cm = volume: 172.6 mL. Contains multiple cysts. A representative cyst measures 4.3 cm. Diffuse increased cortical echogenicity. Left Kidney: Renal measurements: 12.8 x 6.1 x 5.8 cm = volume: 236.4 mL. Contains multiple cysts. A representative cyst measures 3.1 cm. Diffuse increased cortical echogenicity. Bladder: Appears normal for degree of bladder distention. Other: None. IMPRESSION: 1. Medical renal disease.  Bilateral renal cysts. Electronically Signed   By: Dorise Bullion III M.D   On: 12/23/2019 14:35   DG Hand 2 View Left  Result Date: 01/10/2020 CLINICAL DATA:  Left hand pain and swelling EXAM: LEFT HAND - 2 VIEW COMPARISON:  None. FINDINGS: There is no evidence of fracture or dislocation. There is no evidence of arthropathy or other focal bone abnormality. Soft tissues are unremarkable. IMPRESSION: Negative. Electronically Signed   By: Prudencio Pair M.D.   On: 01/10/2020 15:50   US Venous Img Lower Bilateral (DVT)  Result Date: 12/23/2019 CLINICAL DATA:  54 year old with lower extremity pain and edema. EXAM: BILATERAL LOWER EXTREMITY VENOUS DOPPLER ULTRASOUND TECHNIQUE: Gray-scale sonography with graded compression, as well as color Doppler and  duplex ultrasound were performed to evaluate the lower extremity deep venous systems from the level of the common femoral vein and including the common femoral, femoral, profunda femoral, popliteal and calf veins including the posterior tibial, peroneal and gastrocnemius veins when visible. The superficial great saphenous vein was also interrogated. Spectral Doppler was utilized to evaluate flow at rest and with distal augmentation maneuvers in the common femoral, femoral and popliteal veins. COMPARISON:  None. FINDINGS: RIGHT LOWER EXTREMITY Common Femoral Vein: No evidence of thrombus. Normal compressibility, respiratory phasicity and response to augmentation. Saphenofemoral Junction: No evidence of thrombus. Normal compressibility and flow on color Doppler imaging. Profunda Femoral Vein: No evidence of thrombus. Normal compressibility and flow on color Doppler imaging. Femoral Vein: No evidence of thrombus. Normal compressibility, respiratory phasicity and response to augmentation. Popliteal Vein: No evidence of thrombus. Normal compressibility, respiratory phasicity and response to augmentation. Calf Veins: No evidence of thrombus. Normal compressibility  and flow on color Doppler imaging. Other Findings:  None. LEFT LOWER EXTREMITY Common Femoral Vein: No evidence of thrombus. Normal compressibility, respiratory phasicity and response to augmentation. Saphenofemoral Junction: No evidence of thrombus. Normal compressibility and flow on color Doppler imaging. Profunda Femoral Vein: No evidence of thrombus. Normal compressibility and flow on color Doppler imaging. Femoral Vein: No evidence of thrombus. Normal compressibility, respiratory phasicity and response to augmentation. Popliteal Vein: No evidence of thrombus. Normal compressibility, respiratory phasicity and response to augmentation. Calf Veins: No evidence of thrombus. Normal compressibility and flow on color Doppler imaging. Other Findings:  None.  IMPRESSION: No evidence of deep venous thrombosis in either lower extremity. Electronically Signed   By: Markus Daft M.D.   On: 12/23/2019 12:58   US Venous Img Upper Uni Left (DVT)  Result Date: 01/07/2020 CLINICAL DATA:  Pain left arm x2 weeks EXAM: LEFT UPPER EXTREMITY VENOUS DOPPLER ULTRASOUND TECHNIQUE: Gray-scale sonography with graded compression, as well as color Doppler and duplex ultrasound were performed to evaluate the upper extremity deep venous system from the level of the subclavian vein and including the jugular, axillary, basilic, radial, ulnar and upper cephalic vein. Spectral Doppler was utilized to evaluate flow at rest and with distal augmentation maneuvers. COMPARISON:  None. FINDINGS: Contralateral Subclavian Vein: Respiratory phasicity is normal and symmetric with the symptomatic side. No evidence of thrombus. Normal compressibility. Internal Jugular Vein: No evidence of thrombus. Normal compressibility, respiratory phasicity and response to augmentation. Subclavian Vein: No evidence of thrombus. Normal compressibility, respiratory phasicity and response to augmentation. Axillary Vein: No evidence of thrombus. Normal compressibility, respiratory phasicity and response to augmentation. Cephalic Vein: No evidence of thrombus. Normal compressibility, respiratory phasicity and response to augmentation. Basilic Vein: No evidence of thrombus. Normal compressibility, respiratory phasicity and response to augmentation. Brachial Veins: No evidence of thrombus. Normal compressibility, respiratory phasicity and response to augmentation. Radial Veins: No evidence of thrombus. Normal compressibility, respiratory phasicity and response to augmentation. Ulnar Veins: No evidence of thrombus. Normal compressibility, respiratory phasicity and response to augmentation. Other Findings:  None visualized. IMPRESSION: No evidence of DVT within the left upper extremity. Electronically Signed   By: Constance Holster M.D.   On: 01/07/2020 18:44   DG Chest Port 1 View  Result Date: 12/28/2019 CLINICAL DATA:  Shortness of breath. EXAM: PORTABLE CHEST 1 VIEW COMPARISON:  12/24/2019. FINDINGS: Stable cardiomegaly. Prominent diffuse bilateral pulmonary infiltrates/edema again noted. Similar findings on prior exam. No prominent pleural effusion. No pneumothorax. IMPRESSION: Stable cardiomegaly. Prominent diffuse bilateral pulmonary infiltrates/edema again noted. Similar findings on prior exam. Electronically Signed   By: Marcello Moores  Register   On: 12/28/2019 05:36   DG Chest Port 1 View  Result Date: 12/24/2019 CLINICAL DATA:  Follow-up for COVID-19 pneumonia. EXAM: PORTABLE CHEST 1 VIEW COMPARISON:  12/22/2019 and earlier studies. FINDINGS: Bilateral airspace lung opacities are without significant change from the most recent prior study, allowing for lower lung volumes on the current exam. No new lung abnormalities. No convincing pleural effusion and no pneumothorax. IMPRESSION: 1. No significant interval change when compared to the most recent prior study. Bilateral airspace lung opacities consistent with multifocal pneumonia. Electronically Signed   By: Lajean Manes M.D.   On: 12/24/2019 09:03   DG Chest Port 1 View  Result Date: 12/22/2019 CLINICAL DATA:  Questionable sepsis.  Evaluate for abnormality. EXAM: PORTABLE CHEST 1 VIEW COMPARISON:  12/13/2019 FINDINGS: Extensive bilateral airspace opacities. No visible pleural effusions or pneumothorax. Cardiac silhouette is partially obscured, but likely  mildly enlarged. No acute osseous abnormality. IMPRESSION: Extensive bilateral airspace opacities, concerning for multifocal pneumonia. Recommend correlation with COVID testing. Electronically Signed   By: Margaretha Sheffield MD   On: 12/22/2019 09:06      Labs: BNP (last 3 results) Recent Labs    12/29/19 0633  BNP 44.6   Basic Metabolic Panel: Recent Labs  Lab 01/13/20 0605 01/17/20 0603  NA  --  134*   K  --  4.6  CL  --  99  CO2  --  24  GLUCOSE  --  93  BUN  --  64*  CREATININE 2.28* 2.38*  CALCIUM  --  9.0   Liver Function Tests: No results for input(s): AST, ALT, ALKPHOS, BILITOT, PROT, ALBUMIN in the last 168 hours. No results for input(s): LIPASE, AMYLASE in the last 168 hours. No results for input(s): AMMONIA in the last 168 hours. CBC: Recent Labs  Lab 01/13/20 0605 01/17/20 0603  WBC 7.3 7.0  HGB 12.5* 12.3*  HCT 38.0* 37.7*  MCV 87.6 88.3  PLT 200 361   Cardiac Enzymes: No results for input(s): CKTOTAL, CKMB, CKMBINDEX, TROPONINI in the last 168 hours. BNP: Invalid input(s): POCBNP CBG: Recent Labs  Lab 01/18/20 0806 01/18/20 1142 01/18/20 1655 01/18/20 2156 01/19/20 0805  GLUCAP 85 99 118* 110* 98   D-Dimer No results for input(s): DDIMER in the last 72 hours. Hgb A1c No results for input(s): HGBA1C in the last 72 hours. Lipid Profile No results for input(s): CHOL, HDL, LDLCALC, TRIG, CHOLHDL, LDLDIRECT in the last 72 hours. Thyroid function studies No results for input(s): TSH, T4TOTAL, T3FREE, THYROIDAB in the last 72 hours.  Invalid input(s): FREET3 Anemia work up No results for input(s): VITAMINB12, FOLATE, FERRITIN, TIBC, IRON, RETICCTPCT in the last 72 hours. Urinalysis    Component Value Date/Time   COLORURINE AMBER (A) 12/22/2019 0849   APPEARANCEUR CLOUDY (A) 12/22/2019 0849   APPEARANCEUR Clear 02/26/2014 1126   LABSPEC 1.017 12/22/2019 0849   LABSPEC 1.008 02/26/2014 1126   PHURINE 5.0 12/22/2019 0849   GLUCOSEU NEGATIVE 12/22/2019 0849   GLUCOSEU Negative 02/26/2014 1126   HGBUR LARGE (A) 12/22/2019 0849   BILIRUBINUR NEGATIVE 12/22/2019 0849   BILIRUBINUR Negative 02/26/2014 1126   KETONESUR NEGATIVE 12/22/2019 0849   PROTEINUR >=300 (A) 12/22/2019 0849   NITRITE NEGATIVE 12/22/2019 0849   LEUKOCYTESUR MODERATE (A) 12/22/2019 0849   LEUKOCYTESUR 1+ 02/26/2014 1126   Sepsis Labs Invalid input(s): PROCALCITONIN,  WBC,   LACTICIDVEN Microbiology No results found for this or any previous visit (from the past 240 hour(s)).   Total time spend on discharging this patient, including the last patient exam, discussing the hospital stay, instructions for ongoing care as it relates to all pertinent caregivers, as well as preparing the medical discharge records, prescriptions, and/or referrals as applicable, is 35 minutes.    Enzo Bi, MD  [facility] 01/19/2020, 10:55 AM

## 2020-01-19 NOTE — TOC Transition Note (Signed)
Transition of Care [facility]) - CM/SW Discharge Note   Patient Details  Name: Hector [name] MRN: [id number] Date of Birth: 1965/11/24  Transition of Care [facility]) CM/SW Contact:  Magnus Ivan, LCSW Phone Number: 01/19/2020, 12:35 PM   Patient to discharge to [facility] today, Room TBD. [place] confirmed with [employer]. CSW updated MD, RN, patient, and patient's mother (per his request). Asked RN to call report and MD to submit DC Summary. Medical Necessity Form and Face Sheet placed in Discharge Packet by patient chart. [facility] transport arranged, patient is currently 4th on the list. No other needs identified prior to discharge.      Final next level of care: Skilled Nursing Facility Barriers to Discharge: Barriers Resolved   Patient Goals and CMS Choice Patient states their goals for this hospitalization and ongoing recovery are:: SNF rehab CMS Medicare.gov Compare Post Acute Care list provided to:: Patient Choice offered to / list presented to : Patient, Parent  Discharge Placement              Patient chooses bed at: [facility] Patient to be transferred to facility by: [facility] Name of family member notified: Mother [name] Patient and family notified of of transfer: 01/19/20  Discharge Plan and Services In-house Referral: NA   Post Acute Care Choice: Home Health          DME Arranged: Oxygen DME Agency: Other - Comment [name]) Date DME Agency Contacted: 01/03/20 Time DME Agency Contacted: [phone] Representative spoke with at DME Agency: [place] ([place]) Interventions     Readmission Risk Interventions Readmission Risk Prevention Plan 01/02/2020  Transportation Screening Complete  PCP or Specialist Appt within 3-5 Days Complete  HRI or [place] Complete  Social Work Consult for [place] Planning/Counseling Complete  Palliative Care Screening Not Applicable   Medication Review Press photographer) Complete  Some recent data might be hidden

## 2020-01-19 NOTE — Progress Notes (Signed)
Pt has been transported to [place] [facility] with [facility] services. EMT personnel was able to safely transfer pt from bed to stretcher. Pt vitals are WNL, pt is alert and oriented and denies any discomforts. No distress is noted upon discharge.

## 2020-01-19 NOTE — TOC Progression Note (Addendum)
Transition of Care [facility]) - Progression Note    Patient Details  Name: JADIEN LEHIGH MRN: [id number] Date of Birth: 1965/08/23  Transition of Care [facility]) CM/SW Contact  Anselm Pancoast, RN Phone Number: 01/19/2020, 9:56 AM  Clinical Narrative:    Damaris Schooner to [facility] @ [phone]. Insurance auth completed and patient is approved for admission. Report can be called to [phone] and room number will be given at that time. Will need to update Logan on anticipated time of discharge and if arriving via personal vehicle or EMS.   Confirmed patient will be discharging via Ems. Notified Logan @ [place]. Logan updated that she had received discharge summary however it was blank. Requested dc summary be faxed [phone] if needed.    Expected Discharge Plan: [place] Barriers to Discharge: Continued Medical Work up  Expected Discharge Plan and Services Expected Discharge Plan: [place] In-house Referral: NA   Post Acute Care Choice: [place] arrangements for the past 2 months: Single Family Home Expected Discharge Date: 01/03/20               DME Arranged: Oxygen DME Agency: Other - Comment [name]) Date DME Agency Contacted: 01/03/20 Time DME Agency Contacted: [phone] Representative spoke with at DME Agency: [place] ([place]) Interventions    Readmission Risk Interventions Readmission Risk Prevention Plan 01/02/2020  Transportation Screening Complete  PCP or Specialist Appt within 3-5 Days Complete  HRI or [place] Complete  Social Work Consult for [place] Planning/Counseling Complete  Palliative Care Screening Not Applicable  Medication Review Press photographer) Complete  Some recent data might be hidden

## 2020-01-22 NOTE — Progress Notes (Signed)
[name], male    DOB: August 24, 1965, 54 y.o.   MRN: [id number]  HPI  Mr Celani is a 54 y/o male with a history of MI, hyperlipidemia, HTN, CKD, atrial fibrillation, alcohol use, cardiomyopathy, and CHF.   Echo done 12/03/16 showed an EF of 45-50% which is an improvement from previous echo which was done on 06/02/16 and showed an EF of 30-35%. Prior echo on 02/21/15 which showed a normalization of his EF which was 50-55% with mild MR. No longer ICD candidate. Prior echo on 03/07/14 showed an EF of 20-25% with mild/mod MR.   Last left heart cath was done 01/09/15.   Admitted 12/22/19 due to acute hypoxemic respiratory failure, ARDS, COVID-19 pneumonia. Initially needed 100% rebreather due to hypoxia but then weaned down to room air. Nephrology consult obtained. Completed steroids, remdesivir and baricitinib. Lasix changed to PRN and entresto held due to AKI. Discharged to SNF after 28 days. Was in the ED 12/13/19 due to left neck and left arm pain. Evaluated and released.   He presents today for a follow-up visit with a chief complaint of moderate shortness of breath with little exertion. He describes this as having been present since his recent lengthy hospitalization. He has associated cough, back pain, leg weakness and weakness in his left hand along with this. He denies any difficulty sleeping (although CPAP is currently at home & not w/ him at [facility]), dizziness, abdominal distention, palpitations, pedal edema, chest pain or fatigue.   Reports a decreased appetite because "the food is terrible". Is having moderate back pain and is requesting something for that. Advised to speak with provider at the facility as I could not prescribe anything for his back pain.   Describes so much leg weakness that he's unable to stand and pivot and needs complete assistance in getting up. Says that PT has recently started. Is also unable to extend all the fingers on his left hand. 4th & 5th  finders are flexed and he can't actively extend them.    Facility had sent paperwork with patient but patient says that somewhere between the [place] ride and here, he lost them. He does not know what medications he is currently taking.   Past Medical History:  Diagnosis Date  . Alcohol abuse   . 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 12/2007 MV: EF 28%, no isch;  b. 8/12 Echo: EF 25-35%; c. 02/2014 Echo: EF 20-25%; d. 12/2014 Cath: minimal CAD; e. 01/2015 Echo: EF 50-55%;  d. 05/2016 Echo: EF 30-35%, diff HK, gr2 DD; e. 11/2016 Echo: EF 45-50%, diff HK.  . Chronic combined systolic (congestive) and diastolic (congestive) heart failure ([place])    a. 05/2016 Most recent Echo: EF 30-35%, diff HK, Gr2 DD, mild MR, mod dil LA; b. 11/2016 Echo: EF 45-50%, diff HK.  . CKD (chronic kidney disease), stage III ([place])   . Elevated troponin    Chronically elevated. - level was 0.17-0.10 during recent admission  . Essential hypertension   . GI bleed 11/2013  . Hyperlipidemia   . Pancreatitis   . Paroxysmal A-fib ([place])    a. new onset s/p unsuccessful TEE/DCCV on 08/16/2014; b. on amio/eliquis (CHA2DS2VASc = 2-3); c. reports 1-2 hrs of afib ~ q1mos.  . Paroxysmal atrial flutter ([place])    a. new onset 07/2014; b. s/p unsuccessful TEE/DCCV 08/16/2014; c. on apixaban.  . Sleep-disordered breathing    Has yet to have a sleep study   Past Surgical History:  Procedure Laterality Date  . CARDIAC CATHETERIZATION N/A 01/09/2015   Procedure: Left Heart Cath and Coronary Angiography;  Surgeon: [name], MD;  Location: [facility];  Service: Cardiovascular;  Laterality: N/A;  . ELECTROPHYSIOLOGIC STUDY N/A 08/16/2014   Procedure: CARDIOVERSION;  Surgeon: [name], MD;  Location: [facility] ORS;  Service: Cardiovascular;  Laterality: N/A;  . FLEXIBLE SIGMOIDOSCOPY N/A 10/10/2015   Procedure: FLEXIBLE SIGMOIDOSCOPY;  Surgeon: [name], MD;  Location[facility] ENDOSCOPY;  Service: Endoscopy;   Laterality: N/A;  . KNEE SURGERY Right   . NM MYOVIEW LTD  November 2011   No ischemia or infarction. EF 50-55% ( no improvement from 2009 Myoview EF of 28%  . TEE WITHOUT CARDIOVERSION N/A 08/16/2014   Procedure: TRANSESOPHAGEAL ECHOCARDIOGRAM (TEE);  Surgeon: [name], MD;  Location: [facility] ORS;  Service: Cardiovascular;  Laterality: N/A;   Family History  Problem Relation Age of Onset  . Hypertension Mother   . Hyperlipidemia Mother   . Diabetes Mother    Social History   Tobacco Use  . Smoking status: Former Smoker    Packs/day: 1.00    Years: 12.00    Pack years: 12.00    Quit date: 02/23/1998    Years since quitting: 21.9  . Smokeless tobacco: Never Used  Substance Use Topics  . Alcohol use: No    Alcohol/week: 0.0 standard drinks    Comment: 4 heavy alcohol drinker. Quit 2 years ago.   Allergies  Allergen Reactions  . Esomeprazole Magnesium Other (See Comments)    Suspected interstitial nephritis 2018  . Eggs Or Egg-Derived Products Rash   Prior to Admission medications   Medication Sig Start Date End Date Taking? Authorizing Provider  allopurinol (ZYLOPRIM) 100 MG tablet Take 100 mg by mouth daily as needed.    [provider]  amiodarone (PACERONE) 200 MG tablet Take 1 tablet (200 mg total) by mouth daily. 01/03/18   Alisa Graff, FNP  amLODipine (NORVASC) 10 MG tablet Take 1 tablet (10 mg total) by mouth daily. 01/20/20   Enzo Bi, MD  apixaban (ELIQUIS) 5 MG TABS tablet Take 1 tablet (5 mg total) by mouth 2 (two) times daily. 12/01/18   Alisa Graff, FNP  atorvastatin (LIPITOR) 80 MG tablet Take 1 tablet (80 mg total) by mouth daily. 03/15/18   Alisa Graff, FNP  carvedilol (COREG) 3.125 MG tablet Take 1 tablet (3.125 mg total) by mouth 2 (two) times daily with a meal. 03/15/18   Darylene Price A, FNP  diclofenac Sodium (VOLTAREN) 1 % GEL Apply 2 g topically 2 (two) times daily. To left elbow, left forearm. 01/19/20   Enzo Bi, MD  furosemide  (LASIX) 40 MG tablet Take 1 tablet (40 mg total) by mouth daily as needed for fluid or edema.  01/03/20   Enzo Bi, MD  levothyroxine (SYNTHROID, LEVOTHROID) 100 MCG tablet Take 100 mcg by mouth daily before breakfast.    [provider]  Multiple Vitamin (MULTIVITAMIN WITH MINERALS) TABS tablet Take 1 tablet by mouth daily. 01/20/20   Enzo Bi, MD  Nutritional Supplements (FEEDING SUPPLEMENT, NEPRO CARB STEADY,) LIQD Take 237 mLs by mouth 3 (three) times daily between meals. 01/19/20   Enzo Bi, MD  sacubitril-valsartan (ENTRESTO) 97-103 MG Hold until outpatient followup with cardiology due to acute kidney injury. 01/03/20   Enzo Bi, MD    Review of Systems  Constitutional: Negative for appetite change and fatigue.  HENT: Negative for congestion, postnasal drip  and sore throat.   Eyes: Negative.   Respiratory: Positive for cough ("little bit") and shortness of breath. Negative for chest tightness and wheezing.   Cardiovascular: Negative for chest pain, palpitations and leg swelling.  Gastrointestinal: Negative for abdominal distention and abdominal pain.  Endocrine: Negative.   Genitourinary: Negative.   Musculoskeletal: Positive for back pain and gait problem (unable to stand). Negative for neck pain.  Skin: Negative.   Allergic/Immunologic: Negative.   Neurological: Positive for weakness (legs and left hand). Negative for dizziness and light-headedness.  Hematological: Negative for adenopathy. Does not bruise/bleed easily.  Psychiatric/Behavioral: Negative for dysphoric mood and sleep disturbance (CPAP at home). The patient is not nervous/anxious.    Vitals:   01/23/20 1032  BP: (!) 128/96  Pulse: 97  Resp: 18  SpO2: 94%  Weight: 274 lb (124.3 kg)   Wt Readings from Last 3 Encounters:  01/23/20 274 lb (124.3 kg)  01/12/20 259 lb 6.4 oz (117.7 kg)  12/13/19 274 lb (124.3 kg)   Lab Results  Component Value Date   CREATININE 2.38 (H) 01/17/2020   CREATININE 2.28  (H) 01/13/2020   CREATININE 2.31 (H) 01/11/2020    Physical Exam Vitals and nursing note reviewed.  Constitutional:      Appearance: Normal appearance. He is well-developed.  HENT:     Head: Normocephalic and atraumatic.  Neck:     Vascular: No JVD.  Cardiovascular:     Rate and Rhythm: Normal rate and regular rhythm.  Pulmonary:     Effort: Pulmonary effort is normal.     Breath sounds: No wheezing or rales.  Abdominal:     General: There is no distension.     Palpations: Abdomen is soft.     Tenderness: There is no abdominal tenderness.  Musculoskeletal:        General: Deformity (left hand with flexion of 4th & 5th fingers) present. No tenderness.     Cervical back: Normal range of motion and neck supple.     Right lower leg: No edema.     Left lower leg: No edema.  Skin:    General: Skin is warm and dry.  Neurological:     Mental Status: He is alert and oriented to person, place, and time.     Motor: Weakness present.  Psychiatric:        Mood and Affect: Mood normal.        Behavior: Behavior normal.        Thought Content: Thought content normal.      Assessment & Plan:  1: Chronic heart failure with preserved ejection fraction- - NYHA Class III - euvolemic today - order written for facility to weigh daily and call for an overnight weight gain of >2 pounds or a weekly weight gain of >5 pounds - gave a stated weight today of 274 pounds - says that PT just recently started with him - not adding salt; reports decreased appetite because of the taste of the food - had telemedicine visit with cardiology Rockey Situ) 06/28/2018 - BNP 12/29/19 was 59.6  2: HTN- - BP mildly elevated today; no idea what medications he's currently taking as he lost the paperwork - currently seeing PCP at SNF - BMP from 01/17/20 reviewed and shows sodium 134, potassium 4.6, creatinine 2.38 and GFR 31 - saw nephrology Ardyth Man) 02/10/2019 but more recently during recent admission  3:  Obstructive sleep apnea- - sleeping well at night  - says that his CPAP is at his home  and not at the facility with him - encouraged him to discuss with them about having someone bring it to him   Patient did not bring a medication list as he said that he lost it between the [place] ride and the office. Hospital volunteer found paperwork after patient had left but medication list copy is so faded that I'm unable to read medication names/ dosages  Due to HF stability, will not make a return appointment at this time. Advised patient to call us once he gets home and we can get one scheduled at that time. Patient agreeable and verbalized understanding.

## 2020-01-23 ENCOUNTER — Encounter: Payer: Self-pay | Admitting: Family

## 2020-01-23 ENCOUNTER — Other Ambulatory Visit: Payer: Self-pay

## 2020-01-23 ENCOUNTER — Ambulatory Visit: Payer: Medicaid Other | Attending: Family | Admitting: Family

## 2020-01-23 VITALS — BP 128/96 | HR 97 | Resp 18 | Wt 274.0 lb

## 2020-01-23 DIAGNOSIS — Z79899 Other long term (current) drug therapy: Secondary | ICD-10-CM | POA: Insufficient documentation

## 2020-01-23 DIAGNOSIS — Z7901 Long term (current) use of anticoagulants: Secondary | ICD-10-CM | POA: Insufficient documentation

## 2020-01-23 DIAGNOSIS — I252 Old myocardial infarction: Secondary | ICD-10-CM | POA: Diagnosis not present

## 2020-01-23 DIAGNOSIS — Z8249 Family history of ischemic heart disease and other diseases of the circulatory system: Secondary | ICD-10-CM | POA: Diagnosis not present

## 2020-01-23 DIAGNOSIS — I5032 Chronic diastolic (congestive) heart failure: Secondary | ICD-10-CM

## 2020-01-23 DIAGNOSIS — Z87891 Personal history of nicotine dependence: Secondary | ICD-10-CM | POA: Diagnosis not present

## 2020-01-23 DIAGNOSIS — I130 Hypertensive heart and chronic kidney disease with heart failure and stage 1 through stage 4 chronic kidney disease, or unspecified chronic kidney disease: Secondary | ICD-10-CM | POA: Diagnosis not present

## 2020-01-23 DIAGNOSIS — I429 Cardiomyopathy, unspecified: Secondary | ICD-10-CM | POA: Diagnosis not present

## 2020-01-23 DIAGNOSIS — G4733 Obstructive sleep apnea (adult) (pediatric): Secondary | ICD-10-CM | POA: Diagnosis not present

## 2020-01-23 DIAGNOSIS — M549 Dorsalgia, unspecified: Secondary | ICD-10-CM | POA: Insufficient documentation

## 2020-01-23 DIAGNOSIS — N183 Chronic kidney disease, stage 3 unspecified: Secondary | ICD-10-CM | POA: Diagnosis not present

## 2020-01-23 DIAGNOSIS — I10 Essential (primary) hypertension: Secondary | ICD-10-CM

## 2020-01-23 DIAGNOSIS — E785 Hyperlipidemia, unspecified: Secondary | ICD-10-CM | POA: Insufficient documentation

## 2020-01-23 NOTE — Patient Instructions (Addendum)
Begin weighing daily and call for an overnight weight gain of > 2 pounds or a weekly weight gain of >5 pounds.   Call us after you get home to make another appointment

## 2020-01-29 ENCOUNTER — Encounter (HOSPITAL_COMMUNITY): Payer: Self-pay

## 2020-01-29 ENCOUNTER — Other Ambulatory Visit: Payer: Self-pay

## 2020-01-29 DIAGNOSIS — E1122 Type 2 diabetes mellitus with diabetic chronic kidney disease: Secondary | ICD-10-CM | POA: Diagnosis present

## 2020-01-29 DIAGNOSIS — I480 Paroxysmal atrial fibrillation: Secondary | ICD-10-CM | POA: Diagnosis present

## 2020-01-29 DIAGNOSIS — I426 Alcoholic cardiomyopathy: Secondary | ICD-10-CM | POA: Diagnosis present

## 2020-01-29 DIAGNOSIS — Z87891 Personal history of nicotine dependence: Secondary | ICD-10-CM

## 2020-01-29 DIAGNOSIS — N1832 Chronic kidney disease, stage 3b: Secondary | ICD-10-CM | POA: Diagnosis present

## 2020-01-29 DIAGNOSIS — I130 Hypertensive heart and chronic kidney disease with heart failure and stage 1 through stage 4 chronic kidney disease, or unspecified chronic kidney disease: Secondary | ICD-10-CM | POA: Diagnosis present

## 2020-01-29 DIAGNOSIS — E872 Acidosis: Secondary | ICD-10-CM | POA: Diagnosis present

## 2020-01-29 DIAGNOSIS — N179 Acute kidney failure, unspecified: Principal | ICD-10-CM | POA: Diagnosis present

## 2020-01-29 DIAGNOSIS — B961 Klebsiella pneumoniae [K. pneumoniae] as the cause of diseases classified elsewhere: Secondary | ICD-10-CM | POA: Diagnosis present

## 2020-01-29 DIAGNOSIS — M109 Gout, unspecified: Secondary | ICD-10-CM | POA: Diagnosis present

## 2020-01-29 DIAGNOSIS — N390 Urinary tract infection, site not specified: Secondary | ICD-10-CM | POA: Diagnosis present

## 2020-01-29 DIAGNOSIS — E039 Hypothyroidism, unspecified: Secondary | ICD-10-CM | POA: Diagnosis present

## 2020-01-29 DIAGNOSIS — Z79899 Other long term (current) drug therapy: Secondary | ICD-10-CM

## 2020-01-29 DIAGNOSIS — I5043 Acute on chronic combined systolic (congestive) and diastolic (congestive) heart failure: Secondary | ICD-10-CM | POA: Diagnosis present

## 2020-01-29 DIAGNOSIS — E875 Hyperkalemia: Secondary | ICD-10-CM | POA: Diagnosis present

## 2020-01-29 DIAGNOSIS — Z79890 Hormone replacement therapy (postmenopausal): Secondary | ICD-10-CM

## 2020-01-29 DIAGNOSIS — I428 Other cardiomyopathies: Secondary | ICD-10-CM | POA: Diagnosis present

## 2020-01-29 DIAGNOSIS — Z791 Long term (current) use of non-steroidal anti-inflammatories (NSAID): Secondary | ICD-10-CM

## 2020-01-29 DIAGNOSIS — E785 Hyperlipidemia, unspecified: Secondary | ICD-10-CM | POA: Diagnosis present

## 2020-01-29 DIAGNOSIS — Z1619 Resistance to other specified beta lactam antibiotics: Secondary | ICD-10-CM | POA: Diagnosis present

## 2020-01-29 DIAGNOSIS — Z8616 Personal history of COVID-19: Secondary | ICD-10-CM

## 2020-01-29 DIAGNOSIS — M25429 Effusion, unspecified elbow: Secondary | ICD-10-CM | POA: Diagnosis present

## 2020-01-29 DIAGNOSIS — E0590 Thyrotoxicosis, unspecified without thyrotoxic crisis or storm: Secondary | ICD-10-CM | POA: Diagnosis present

## 2020-01-29 DIAGNOSIS — Z7901 Long term (current) use of anticoagulants: Secondary | ICD-10-CM

## 2020-01-29 NOTE — ED Triage Notes (Signed)
Patient arrived via [facility] from [place] with complaints of bilateral arm twitching that started today at 8pm

## 2020-01-30 ENCOUNTER — Emergency Department (HOSPITAL_COMMUNITY): Payer: Medicaid Other

## 2020-01-30 ENCOUNTER — Inpatient Hospital Stay (HOSPITAL_COMMUNITY)
Admit: 2020-01-30 | Discharge: 2020-01-30 | Disposition: A | Discharge status: Home / Self Care | Payer: Medicaid Other | Attending: Internal Medicine | Admitting: Internal Medicine

## 2020-01-30 ENCOUNTER — Inpatient Hospital Stay (HOSPITAL_COMMUNITY)
Admission: EM | Admit: 2020-01-30 | Discharge: 2020-02-08 | DRG: 682 | Disposition: A | Discharge status: Skilled Nursing Facility | Payer: Medicaid Other | Source: Skilled Nursing Facility | Attending: Internal Medicine | Admitting: Internal Medicine

## 2020-01-30 DIAGNOSIS — Z1619 Resistance to other specified beta lactam antibiotics: Secondary | ICD-10-CM | POA: Diagnosis present

## 2020-01-30 DIAGNOSIS — E1122 Type 2 diabetes mellitus with diabetic chronic kidney disease: Secondary | ICD-10-CM | POA: Diagnosis present

## 2020-01-30 DIAGNOSIS — Z8616 Personal history of COVID-19: Secondary | ICD-10-CM | POA: Diagnosis not present

## 2020-01-30 DIAGNOSIS — R278 Other lack of coordination: Secondary | ICD-10-CM | POA: Diagnosis not present

## 2020-01-30 DIAGNOSIS — Z79899 Other long term (current) drug therapy: Secondary | ICD-10-CM | POA: Diagnosis not present

## 2020-01-30 DIAGNOSIS — E0590 Thyrotoxicosis, unspecified without thyrotoxic crisis or storm: Secondary | ICD-10-CM | POA: Diagnosis present

## 2020-01-30 DIAGNOSIS — N3000 Acute cystitis without hematuria: Secondary | ICD-10-CM

## 2020-01-30 DIAGNOSIS — Z79890 Hormone replacement therapy (postmenopausal): Secondary | ICD-10-CM | POA: Diagnosis not present

## 2020-01-30 DIAGNOSIS — E875 Hyperkalemia: Secondary | ICD-10-CM | POA: Diagnosis present

## 2020-01-30 DIAGNOSIS — E782 Mixed hyperlipidemia: Secondary | ICD-10-CM | POA: Diagnosis present

## 2020-01-30 DIAGNOSIS — I10 Essential (primary) hypertension: Secondary | ICD-10-CM | POA: Diagnosis present

## 2020-01-30 DIAGNOSIS — M25522 Pain in left elbow: Secondary | ICD-10-CM | POA: Clinically undetermined

## 2020-01-30 DIAGNOSIS — I426 Alcoholic cardiomyopathy: Secondary | ICD-10-CM | POA: Diagnosis present

## 2020-01-30 DIAGNOSIS — M25422 Effusion, left elbow: Secondary | ICD-10-CM | POA: Diagnosis not present

## 2020-01-30 DIAGNOSIS — Z87891 Personal history of nicotine dependence: Secondary | ICD-10-CM | POA: Diagnosis not present

## 2020-01-30 DIAGNOSIS — N1832 Chronic kidney disease, stage 3b: Secondary | ICD-10-CM | POA: Diagnosis present

## 2020-01-30 DIAGNOSIS — I5043 Acute on chronic combined systolic (congestive) and diastolic (congestive) heart failure: Secondary | ICD-10-CM | POA: Diagnosis present

## 2020-01-30 DIAGNOSIS — I480 Paroxysmal atrial fibrillation: Secondary | ICD-10-CM | POA: Diagnosis present

## 2020-01-30 DIAGNOSIS — N390 Urinary tract infection, site not specified: Secondary | ICD-10-CM | POA: Diagnosis present

## 2020-01-30 DIAGNOSIS — I5032 Chronic diastolic (congestive) heart failure: Secondary | ICD-10-CM | POA: Diagnosis not present

## 2020-01-30 DIAGNOSIS — I428 Other cardiomyopathies: Secondary | ICD-10-CM | POA: Diagnosis present

## 2020-01-30 DIAGNOSIS — I5033 Acute on chronic diastolic (congestive) heart failure: Secondary | ICD-10-CM | POA: Diagnosis not present

## 2020-01-30 DIAGNOSIS — R0682 Tachypnea, not elsewhere classified: Secondary | ICD-10-CM | POA: Diagnosis not present

## 2020-01-30 DIAGNOSIS — I130 Hypertensive heart and chronic kidney disease with heart failure and stage 1 through stage 4 chronic kidney disease, or unspecified chronic kidney disease: Secondary | ICD-10-CM | POA: Diagnosis present

## 2020-01-30 DIAGNOSIS — M25429 Effusion, unspecified elbow: Secondary | ICD-10-CM | POA: Diagnosis present

## 2020-01-30 DIAGNOSIS — E785 Hyperlipidemia, unspecified: Secondary | ICD-10-CM | POA: Diagnosis present

## 2020-01-30 DIAGNOSIS — N19 Unspecified kidney failure: Secondary | ICD-10-CM

## 2020-01-30 DIAGNOSIS — B961 Klebsiella pneumoniae [K. pneumoniae] as the cause of diseases classified elsewhere: Secondary | ICD-10-CM | POA: Diagnosis present

## 2020-01-30 DIAGNOSIS — N170 Acute kidney failure with tubular necrosis: Secondary | ICD-10-CM | POA: Diagnosis not present

## 2020-01-30 DIAGNOSIS — I4892 Unspecified atrial flutter: Secondary | ICD-10-CM | POA: Diagnosis not present

## 2020-01-30 DIAGNOSIS — R7989 Other specified abnormal findings of blood chemistry: Secondary | ICD-10-CM | POA: Diagnosis not present

## 2020-01-30 DIAGNOSIS — N179 Acute kidney failure, unspecified: Secondary | ICD-10-CM | POA: Diagnosis present

## 2020-01-30 DIAGNOSIS — R569 Unspecified convulsions: Secondary | ICD-10-CM | POA: Diagnosis not present

## 2020-01-30 DIAGNOSIS — Z7901 Long term (current) use of anticoagulants: Secondary | ICD-10-CM | POA: Diagnosis not present

## 2020-01-30 DIAGNOSIS — I5023 Acute on chronic systolic (congestive) heart failure: Secondary | ICD-10-CM | POA: Diagnosis present

## 2020-01-30 DIAGNOSIS — E872 Acidosis: Secondary | ICD-10-CM | POA: Diagnosis present

## 2020-01-30 DIAGNOSIS — M79642 Pain in left hand: Secondary | ICD-10-CM | POA: Diagnosis not present

## 2020-01-30 DIAGNOSIS — M109 Gout, unspecified: Secondary | ICD-10-CM | POA: Diagnosis present

## 2020-01-30 DIAGNOSIS — I5031 Acute diastolic (congestive) heart failure: Secondary | ICD-10-CM | POA: Diagnosis not present

## 2020-01-30 DIAGNOSIS — E039 Hypothyroidism, unspecified: Secondary | ICD-10-CM | POA: Diagnosis present

## 2020-01-30 DIAGNOSIS — Z791 Long term (current) use of non-steroidal anti-inflammatories (NSAID): Secondary | ICD-10-CM | POA: Diagnosis not present

## 2020-01-30 LAB — CBC WITH DIFFERENTIAL/PLATELET
Abs Immature Granulocytes: 0.35 10*3/uL — ABNORMAL HIGH (ref 0.00–0.07)
Basophils Absolute: 0.1 10*3/uL (ref 0.0–0.1)
Basophils Relative: 1 %
Eosinophils Absolute: 0.1 10*3/uL (ref 0.0–0.5)
Eosinophils Relative: 1 %
HCT: 47.3 % (ref 39.0–52.0)
Hemoglobin: 14.6 g/dL (ref 13.0–17.0)
Immature Granulocytes: 2 %
Lymphocytes Relative: 16 %
Lymphs Abs: 2.9 10*3/uL (ref 0.7–4.0)
MCH: 28.2 pg (ref 26.0–34.0)
MCHC: 30.9 g/dL (ref 30.0–36.0)
MCV: 91.5 fL (ref 80.0–100.0)
Monocytes Absolute: 1.4 10*3/uL — ABNORMAL HIGH (ref 0.1–1.0)
Monocytes Relative: 8 %
Neutro Abs: 12.8 10*3/uL — ABNORMAL HIGH (ref 1.7–7.7)
Neutrophils Relative %: 72 %
Platelets: 315 10*3/uL (ref 150–400)
RBC: 5.17 MIL/uL (ref 4.22–5.81)
RDW: 15.3 % (ref 11.5–15.5)
WBC: 17.7 10*3/uL — ABNORMAL HIGH (ref 4.0–10.5)
nRBC: 0 % (ref 0.0–0.2)

## 2020-01-30 LAB — URINALYSIS, MICROSCOPIC (REFLEX)

## 2020-01-30 LAB — BASIC METABOLIC PANEL
Anion gap: 18 — ABNORMAL HIGH (ref 5–15)
BUN: 63 mg/dL — ABNORMAL HIGH (ref 6–20)
CO2: 14 mmol/L — ABNORMAL LOW (ref 22–32)
Calcium: 10.1 mg/dL (ref 8.9–10.3)
Chloride: 104 mmol/L (ref 98–111)
Creatinine, Ser: 2.79 mg/dL — ABNORMAL HIGH (ref 0.61–1.24)
GFR, Estimated: 26 mL/min — ABNORMAL LOW (ref >60)
Glucose, Bld: 127 mg/dL — ABNORMAL HIGH (ref 70–99)
Potassium: 5.4 mmol/L — ABNORMAL HIGH (ref 3.5–5.1)
Sodium: 136 mmol/L (ref 135–145)

## 2020-01-30 LAB — HEPATIC FUNCTION PANEL
ALT: 45 U/L — ABNORMAL HIGH (ref 0–44)
AST: 40 U/L (ref 15–41)
Albumin: 3.4 g/dL — ABNORMAL LOW (ref 3.5–5.0)
Alkaline Phosphatase: 100 U/L (ref 38–126)
Bilirubin, Direct: 0.4 mg/dL — ABNORMAL HIGH (ref 0.0–0.2)
Indirect Bilirubin: 1.1 mg/dL — ABNORMAL HIGH (ref 0.3–0.9)
Total Bilirubin: 1.5 mg/dL — ABNORMAL HIGH (ref 0.3–1.2)
Total Protein: 7.1 g/dL (ref 6.5–8.1)

## 2020-01-30 LAB — URINALYSIS, ROUTINE W REFLEX MICROSCOPIC
Bilirubin Urine: NEGATIVE
Glucose, UA: NEGATIVE mg/dL
Ketones, ur: NEGATIVE mg/dL
Nitrite: NEGATIVE
Protein, ur: 100 mg/dL — AB
Specific Gravity, Urine: 1.02 (ref 1.005–1.030)
pH: 5.5 (ref 5.0–8.0)

## 2020-01-30 LAB — I-STAT CHEM 8, ED
BUN: 80 mg/dL — ABNORMAL HIGH (ref 6–20)
Calcium, Ion: 1.12 mmol/L — ABNORMAL LOW (ref 1.15–1.40)
Chloride: 106 mmol/L (ref 98–111)
Creatinine, Ser: 2.9 mg/dL — ABNORMAL HIGH (ref 0.61–1.24)
Glucose, Bld: 84 mg/dL (ref 70–99)
HCT: 42 % (ref 39.0–52.0)
Hemoglobin: 14.3 g/dL (ref 13.0–17.0)
Potassium: 5.7 mmol/L — ABNORMAL HIGH (ref 3.5–5.1)
Sodium: 136 mmol/L (ref 135–145)
TCO2: 20 mmol/L — ABNORMAL LOW (ref 22–32)

## 2020-01-30 LAB — RAPID URINE DRUG SCREEN, HOSP PERFORMED
Amphetamines: NOT DETECTED
Barbiturates: NOT DETECTED
Benzodiazepines: NOT DETECTED
Cocaine: NOT DETECTED
Opiates: NOT DETECTED
Tetrahydrocannabinol: NOT DETECTED

## 2020-01-30 LAB — CBG MONITORING, ED
Glucose-Capillary: 91 mg/dL (ref 70–99)
Glucose-Capillary: 91 mg/dL (ref 70–99)

## 2020-01-30 LAB — GLUCOSE, CAPILLARY: Glucose-Capillary: 96 mg/dL (ref 70–99)

## 2020-01-30 LAB — AMMONIA: Ammonia: 20 umol/L (ref 9–35)

## 2020-01-30 LAB — BRAIN NATRIURETIC PEPTIDE: B Natriuretic Peptide: 74.1 pg/mL (ref 0.0–100.0)

## 2020-01-30 IMAGING — CR DG CHEST 2V
2 series · 2 of 2 positions shown · non-contrast
Comparison: [DATE]

CLINICAL DATA: Shortness of breath and elevated white blood cell
count

EXAM:
CHEST - 2 VIEW

[w chest lat]
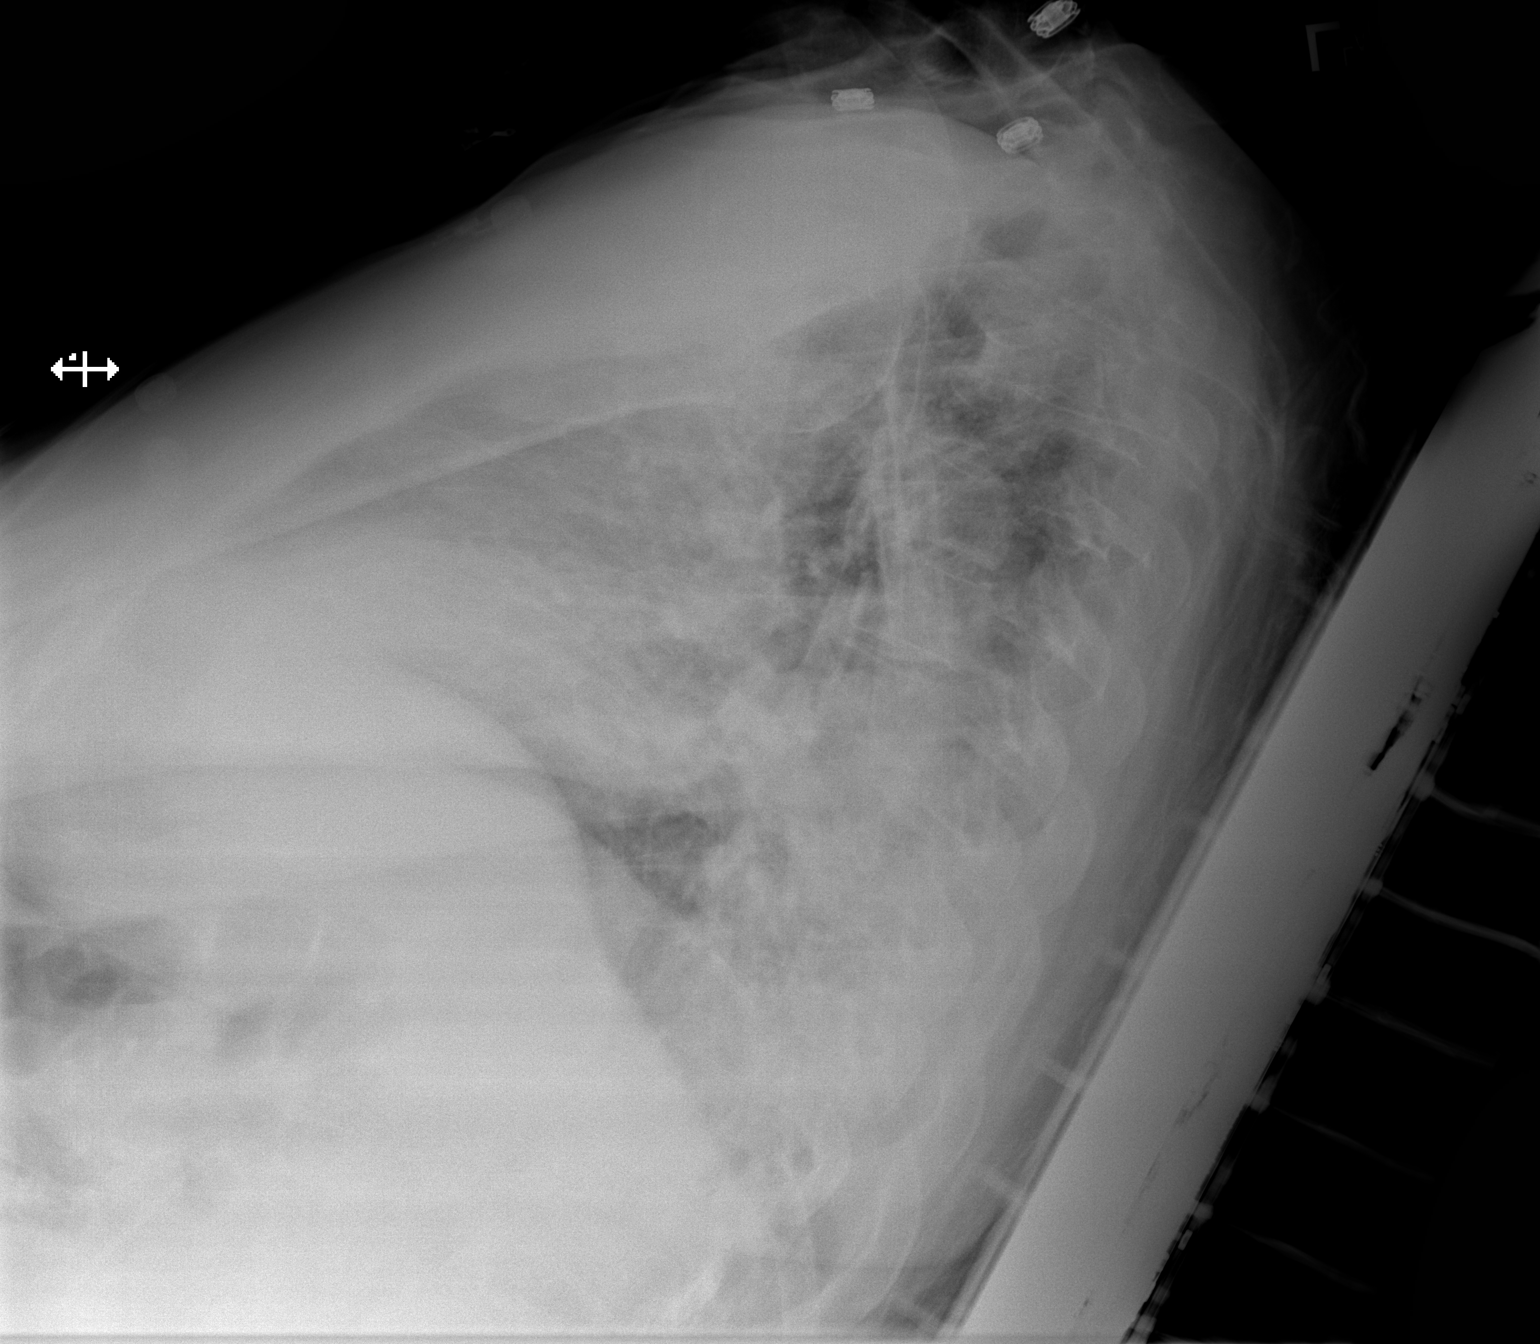

[x chest ap]
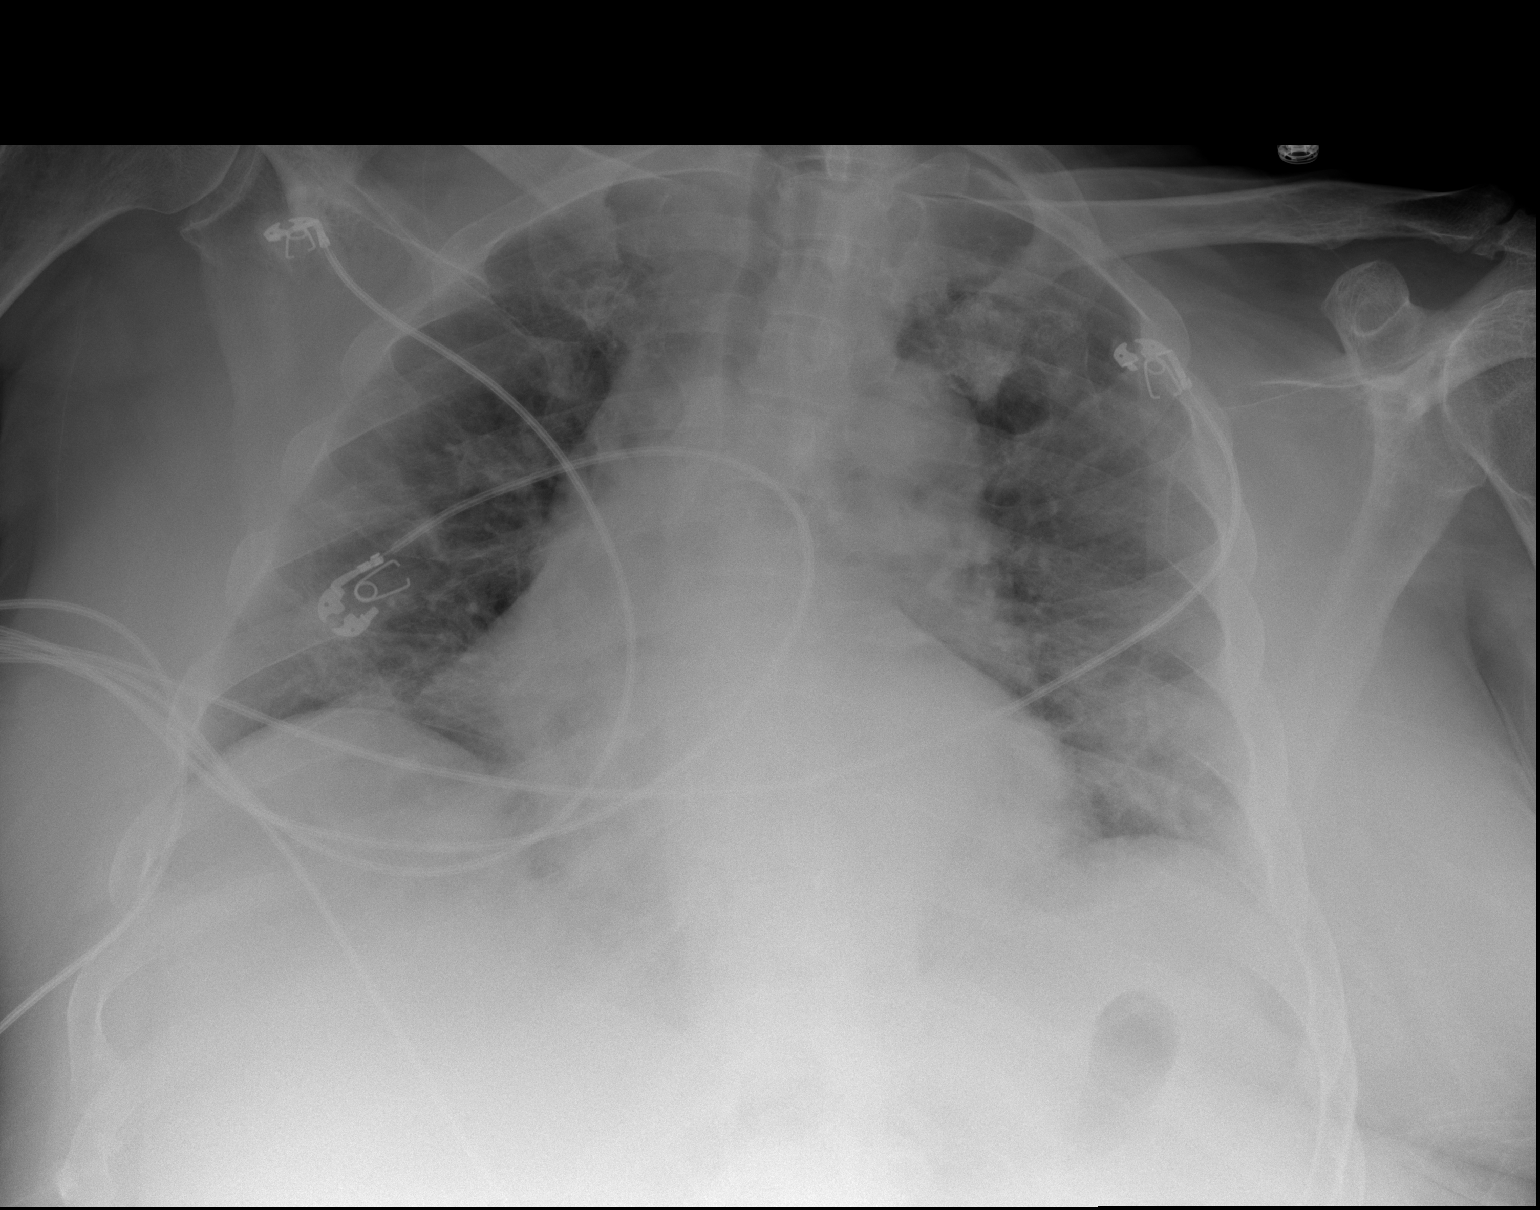

[2 of 2 positions shown; findings below may reference images not displayed]

FINDINGS: Cardiomegaly and vascular pedicle widening with aortic tortuosity.
There is improved aeration since prior. Congested appearance of
vessels without focal pneumonia. No visible effusion or
pneumothorax.
IMPRESSION: Cardiomegaly and vascular congestion. Aeration is improved since
comparison last month.

## 2020-01-30 MED ORDER — SODIUM CHLORIDE 0.9 % IV SOLN: Freq: Once | INTRAVENOUS | Status: AC

## 2020-01-30 MED ORDER — SODIUM CHLORIDE 0.9 % IV SOLN
1.0000 g | Freq: Once | INTRAVENOUS | Status: AC
  Administered 2020-01-30: 1 g via INTRAVENOUS
  Filled 2020-01-30: qty 10

## 2020-01-30 MED ORDER — AMIODARONE HCL 200 MG PO TABS
200.0000 mg | ORAL_TABLET | Freq: Every day | ORAL | Status: DC
  Administered 2020-01-30 – 2020-02-07 (×9): 200 mg via ORAL
  Filled 2020-01-30 (×9): qty 1

## 2020-01-30 MED ORDER — SODIUM CHLORIDE 0.9 % IV SOLN
1.0000 g | INTRAVENOUS | Status: DC
  Administered 2020-01-31 – 2020-02-03 (×4): 1 g via INTRAVENOUS
  Filled 2020-01-30 (×4): qty 1

## 2020-01-30 MED ORDER — SODIUM ZIRCONIUM CYCLOSILICATE 10 G PO PACK
10.0000 g | PACK | Freq: Two times a day (BID) | ORAL | Status: DC
  Administered 2020-01-30 – 2020-01-31 (×2): 10 g via ORAL
  Filled 2020-01-30 (×2): qty 1

## 2020-01-30 MED ORDER — SODIUM CHLORIDE 0.9 % IV BOLUS
500.0000 mL | Freq: Once | INTRAVENOUS | Status: AC
  Administered 2020-01-30: 500 mL via INTRAVENOUS

## 2020-01-30 MED ORDER — ACETAMINOPHEN 325 MG PO TABS
650.0000 mg | ORAL_TABLET | Freq: Four times a day (QID) | ORAL | Status: DC | PRN
  Administered 2020-02-02 – 2020-02-03 (×2): 650 mg via ORAL
  Filled 2020-01-30 (×3): qty 2

## 2020-01-30 MED ORDER — INSULIN ASPART 100 UNIT/ML ~~LOC~~ SOLN
0.0000 [IU] | Freq: Every day | SUBCUTANEOUS | Status: DC
  Filled 2020-01-30: qty 0.05

## 2020-01-30 MED ORDER — SODIUM ZIRCONIUM CYCLOSILICATE 10 G PO PACK
10.0000 g | PACK | Freq: Once | ORAL | Status: AC
  Administered 2020-01-30: 10 g via ORAL
  Filled 2020-01-30: qty 1

## 2020-01-30 MED ORDER — ATORVASTATIN CALCIUM 40 MG PO TABS
80.0000 mg | ORAL_TABLET | Freq: Every day | ORAL | Status: DC
  Administered 2020-01-30 – 2020-02-07 (×9): 80 mg via ORAL
  Filled 2020-01-30 (×9): qty 2

## 2020-01-30 MED ORDER — ACETAMINOPHEN 650 MG RE SUPP: 650.0000 mg | Freq: Four times a day (QID) | RECTAL | Status: DC | PRN

## 2020-01-30 MED ORDER — APIXABAN 5 MG PO TABS
5.0000 mg | ORAL_TABLET | Freq: Two times a day (BID) | ORAL | Status: DC
  Administered 2020-01-30 – 2020-02-07 (×18): 5 mg via ORAL
  Filled 2020-01-30 (×12): qty 1
  Filled 2020-01-30: qty 2
  Filled 2020-01-30 (×3): qty 1
  Filled 2020-01-30: qty 2
  Filled 2020-01-30 (×2): qty 1

## 2020-01-30 MED ORDER — LEVOTHYROXINE SODIUM 100 MCG PO TABS
100.0000 ug | ORAL_TABLET | Freq: Every day | ORAL | Status: DC
  Administered 2020-01-31 – 2020-02-01 (×2): 100 ug via ORAL
  Filled 2020-01-30 (×2): qty 1

## 2020-01-30 MED ORDER — INSULIN ASPART 100 UNIT/ML ~~LOC~~ SOLN
0.0000 [IU] | Freq: Three times a day (TID) | SUBCUTANEOUS | Status: DC
  Administered 2020-02-06: 17:00:00 5 [IU] via SUBCUTANEOUS
  Administered 2020-02-07: 17:00:00 3 [IU] via SUBCUTANEOUS
  Administered 2020-02-07: 12:00:00 2 [IU] via SUBCUTANEOUS
  Filled 2020-01-30: qty 0.15

## 2020-01-30 MED ORDER — CARVEDILOL 3.125 MG PO TABS
3.1250 mg | ORAL_TABLET | Freq: Two times a day (BID) | ORAL | Status: DC
  Administered 2020-01-30 – 2020-01-31 (×3): 3.125 mg via ORAL
  Filled 2020-01-30 (×3): qty 1

## 2020-01-30 MED ORDER — AMLODIPINE BESYLATE 10 MG PO TABS
10.0000 mg | ORAL_TABLET | Freq: Every day | ORAL | Status: DC
  Administered 2020-01-30 – 2020-02-07 (×9): 10 mg via ORAL
  Filled 2020-01-30 (×6): qty 1
  Filled 2020-01-30: qty 2
  Filled 2020-01-30 (×2): qty 1

## 2020-01-30 NOTE — ED Notes (Signed)
ED TO INPATIENT HANDOFF REPORT  Name/Age/Gender Hector [name] 54 y.o. male  Code Status    Code Status Orders  (From admission, onward)         Start     Ordered   01/30/20 0916  Full code  Continuous        01/30/20 0915        Code Status History    Date Active Date Inactive Code Status Order ID Comments User Context   12/22/2019 1910 01/20/2020 0121 Full Code [id number]  Rosine Door, MD ED   12/22/2019 1243 12/22/2019 1910 Full Code [id number]  Rosine Door, MD ED   10/08/2015 0752 10/10/2015 1959 Full Code [id number]  Harrie Foreman, MD Inpatient   01/09/2015 1106 01/09/2015 1800 Full Code [id number]  [name], MD Inpatient   08/14/2014 1501 08/19/2014 1405 Full Code [id number]  Henreitta Leber, MD Inpatient   Advance Care Planning Activity      Home/SNF/Other Nursing Home  Chief Complaint UTI (urinary tract infection) [N39.0]  Level of Care/Admitting Diagnosis ED Disposition    ED Disposition Condition [facility]: [facility] [[id number]]  Level of Care: Telemetry [5]  Admit to tele based on following criteria: Monitor for Ischemic changes  May admit patient to [facility] or [facility] if equivalent level of care is available:: No  Covid Evaluation: Recent COVID positive no isolation required infection day 21-90  Diagnosis: UTI (urinary tract infection) [270623]  Admitting Physician: Jonnie Finner [[id number]]  Attending Physician: Jonnie Finner [[id number]]  Estimated length of stay: past midnight tomorrow  Certification:: I certify this patient will need inpatient services for at least 2 midnights       Medical History Past Medical History:  Diagnosis Date  . Alcohol abuse   . 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 12/2007 MV: EF 28%, no isch;  b. 8/12 Echo: EF 25-35%; c. 02/2014 Echo: EF 20-25%; d. 12/2014 Cath: minimal CAD; e. 01/2015 Echo: EF 50-55%;  d. 05/2016 Echo: EF 30-35%, diff HK, gr2 DD; e.  11/2016 Echo: EF 45-50%, diff HK.  . Chronic combined systolic (congestive) and diastolic (congestive) heart failure ([place])    a. 05/2016 Most recent Echo: EF 30-35%, diff HK, Gr2 DD, mild MR, mod dil LA; b. 11/2016 Echo: EF 45-50%, diff HK.  . CKD (chronic kidney disease), stage III ([place])   . Elevated troponin    Chronically elevated. - level was 0.17-0.10 during recent admission  . Essential hypertension   . GI bleed 11/2013  . Hyperlipidemia   . Pancreatitis   . Paroxysmal A-fib ([place])    a. new onset s/p unsuccessful TEE/DCCV on 08/16/2014; b. on amio/eliquis (CHA2DS2VASc = 2-3); c. reports 1-2 hrs of afib ~ q6mos.  . Paroxysmal atrial flutter ([place])    a. new onset 07/2014; b. s/p unsuccessful TEE/DCCV 08/16/2014; c. on apixaban.  . Sleep-disordered breathing    Has yet to have a sleep study    Allergies Allergies  Allergen Reactions  . Esomeprazole Magnesium Other (See Comments)    Suspected interstitial nephritis 2018  . Eggs Or Egg-Derived Products Rash    IV Location/Drains/Wounds Patient Lines/Drains/Airways Status    Active Line/Drains/Airways    Name Placement date Placement time Site Days   Peripheral IV 01/30/20 Right Hand 01/30/20  0440  Hand  less than 1   External Urinary Catheter 12/22/19  1631  --  39  Labs/Imaging Results for orders placed or performed during the hospital encounter of 01/30/20 (from the past 48 hour(s))  CBC with Differential     Status: Abnormal   Collection Time: 01/30/20  2:10 AM  Result Value Ref Range   WBC 17.7 (H) 4.0 - 10.5 K/uL   RBC 5.17 4.22 - 5.81 MIL/uL   Hemoglobin 14.6 13.0 - 17.0 g/dL   HCT 47.3 39 - 52 %   MCV 91.5 80.0 - 100.0 fL   MCH 28.2 26.0 - 34.0 pg   MCHC 30.9 30.0 - 36.0 g/dL   RDW 15.3 11.5 - 15.5 %   Platelets 315 150 - 400 K/uL   nRBC 0.0 0.0 - 0.2 %   Neutrophils Relative % 72 %   Neutro Abs 12.8 (H) 1.7 - 7.7 K/uL   Lymphocytes Relative 16 %   Lymphs Abs 2.9 0.7 - 4.0 K/uL   Monocytes Relative  8 %   Monocytes Absolute 1.4 (H) 0.1 - 1.0 K/uL   Eosinophils Relative 1 %   Eosinophils Absolute 0.1 0.0 - 0.5 K/uL   Basophils Relative 1 %   Basophils Absolute 0.1 0.0 - 0.1 K/uL   Immature Granulocytes 2 %   Abs Immature Granulocytes 0.35 (H) 0.00 - 0.07 K/uL    Comment: Performed at [facility], [place] [address], [place], [place] [address]  Basic metabolic panel     Status: Abnormal   Collection Time: 01/30/20  2:10 AM  Result Value Ref Range   Sodium 136 135 - 145 mmol/L   Potassium 5.4 (H) 3.5 - 5.1 mmol/L   Chloride 104 98 - 111 mmol/L   CO2 14 (L) 22 - 32 mmol/L   Glucose, Bld 127 (H) 70 - 99 mg/dL    Comment: Glucose reference range applies only to samples taken after fasting for at least 8 hours.   BUN 63 (H) 6 - 20 mg/dL   Creatinine, Ser 2.79 (H) 0.61 - 1.24 mg/dL   Calcium 10.1 8.9 - 10.3 mg/dL   GFR, Estimated 26 (L) >60 mL/min    Comment: (NOTE) Calculated using the CKD-EPI Creatinine Equation (2021)    Anion gap 18 (H) 5 - 15    Comment: Performed at [facility], [place] [address], [place], [place] [address]  Urinalysis, Routine w reflex microscopic     Status: Abnormal   Collection Time: 01/30/20  3:26 AM  Result Value Ref Range   Color, Urine YELLOW YELLOW   APPearance CLEAR CLEAR   Specific Gravity, Urine 1.020 1.005 - 1.030   pH 5.5 5.0 - 8.0   Glucose, UA NEGATIVE NEGATIVE mg/dL   Hgb urine dipstick MODERATE (A) NEGATIVE   Bilirubin Urine NEGATIVE NEGATIVE   Ketones, ur NEGATIVE NEGATIVE mg/dL   Protein, ur 100 (A) NEGATIVE mg/dL   Nitrite NEGATIVE NEGATIVE   Leukocytes,Ua LARGE (A) NEGATIVE    Comment: Performed at [facility], [place] [address] [place], [place] [address]  Urinalysis, Microscopic (reflex)     Status: Abnormal   Collection Time: 01/30/20  3:26 AM  Result Value Ref Range   RBC / HPF 0-5 0 - 5 RBC/hpf   WBC, UA 21-50 0 - 5 WBC/hpf   Bacteria, UA MANY (A) NONE SEEN   Squamous  Epithelial / LPF 0-5 0 - 5    Comment: Performed at [facility], [place] [address] [place], [place] [address]  Urine rapid drug screen (hosp performed)  Status: None   Collection Time: 01/30/20  3:26 AM  Result Value Ref Range   Opiates NONE DETECTED NONE DETECTED   Cocaine NONE DETECTED NONE DETECTED   Benzodiazepines NONE DETECTED NONE DETECTED   Amphetamines NONE DETECTED NONE DETECTED   Tetrahydrocannabinol NONE DETECTED NONE DETECTED   Barbiturates NONE DETECTED NONE DETECTED    Comment: (NOTE) DRUG SCREEN FOR MEDICAL PURPOSES ONLY.  IF CONFIRMATION IS NEEDED FOR ANY PURPOSE, NOTIFY LAB WITHIN 5 DAYS.  LOWEST DETECTABLE LIMITS FOR URINE DRUG SCREEN Drug Class                     Cutoff (ng/mL) Amphetamine and metabolites    1000 Barbiturate and metabolites    200 Benzodiazepine                 941 Tricyclics and metabolites     300 Opiates and metabolites        300 Cocaine and metabolites        300 THC                            50 Performed at [facility], [place] [address], [place], [place] [address]   I-stat chem 8, ED (not at [facility] or [facility])     Status: Abnormal   Collection Time: 01/30/20  7:01 AM  Result Value Ref Range   Sodium 136 135 - 145 mmol/L   Potassium 5.7 (H) 3.5 - 5.1 mmol/L   Chloride 106 98 - 111 mmol/L   BUN 80 (H) 6 - 20 mg/dL   Creatinine, Ser 2.90 (H) 0.61 - 1.24 mg/dL   Glucose, Bld 84 70 - 99 mg/dL    Comment: Glucose reference range applies only to samples taken after fasting for at least 8 hours.   Calcium, Ion 1.12 (L) 1.15 - 1.40 mmol/L   TCO2 20 (L) 22 - 32 mmol/L   Hemoglobin 14.3 13.0 - 17.0 g/dL   HCT 42.0 39 - 52 %  Brain natriuretic peptide     Status: None   Collection Time: 01/30/20 10:38 AM  Result Value Ref Range   B Natriuretic Peptide 74.1 0.0 - 100.0 pg/mL    Comment: Performed at [facility], [place] [address], [place], [place] [address]  Hepatic function panel      Status: Abnormal   Collection Time: 01/30/20 10:38 AM  Result Value Ref Range   Total Protein 7.1 6.5 - 8.1 g/dL   Albumin 3.4 (L) 3.5 - 5.0 g/dL   AST 40 15 - 41 U/L   ALT 45 (H) 0 - 44 U/L   Alkaline Phosphatase 100 38 - 126 U/L   Total Bilirubin 1.5 (H) 0.3 - 1.2 mg/dL   Bilirubin, Direct 0.4 (H) 0.0 - 0.2 mg/dL   Indirect Bilirubin 1.1 (H) 0.3 - 0.9 mg/dL    Comment: Performed at [facility], [place] [address] [place], [place] [address]  Ammonia     Status: None   Collection Time: 01/30/20 10:38 AM  Result Value Ref Range   Ammonia 20 9 - 35 umol/L    Comment: Performed at [facility], [place] [address], [place],  [address]  CBG monitoring, ED     Status: None   Collection Time: 01/30/20 12:06 PM  Result Value Ref Range   Glucose-Capillary 91 70 - 99 mg/dL    Comment: Glucose reference range applies  only to samples taken after fasting for at least 8 hours.   DG Chest 2 View  Result Date: 01/30/2020 CLINICAL DATA:  Shortness of breath and elevated white blood cell count EXAM: CHEST - 2 VIEW COMPARISON:  12/28/2019 FINDINGS: Cardiomegaly and vascular pedicle widening with aortic tortuosity. There is improved aeration since prior. Congested appearance of vessels without focal pneumonia. No visible effusion or pneumothorax. IMPRESSION: Cardiomegaly and vascular congestion. Aeration is improved since comparison last month. Electronically Signed   By: Monte Fantasia M.D.   On: 01/30/2020 05:12    Pending Labs Unresulted Labs (From admission, onward)          Start     Ordered   01/31/20 0500  Comprehensive metabolic panel  Tomorrow morning,   R        01/30/20 0915   01/31/20 0500  CBC  Tomorrow morning,   R        01/30/20 0915   01/30/20 0326  Blood culture (routine x 2)  BLOOD CULTURE X 2,   STAT      01/30/20 0326   01/30/20 0326  Urine culture  ONCE - STAT,   STAT        01/30/20 0326          Vitals/Pain Today's Vitals    01/30/20 1400 01/30/20 1500 01/30/20 1520 01/30/20 1600  BP: (!) 140/98 (!) 144/111  (!) 145/105  Pulse: (!) 104 (!) 110  (!) 110  Resp: (!) 28 (!) 30  (!) 40  Temp:      TempSrc:      SpO2: 92% 91%  91%  PainSc:   0-No pain     Isolation Precautions No active isolations  Medications Medications  acetaminophen (TYLENOL) tablet 650 mg (has no administration in time range)    Or  acetaminophen (TYLENOL) suppository 650 mg (has no administration in time range)  cefTRIAXone (ROCEPHIN) 1 g in sodium chloride 0.9 % 100 mL IVPB (has no administration in time range)  sodium zirconium cyclosilicate (LOKELMA) packet 10 g (has no administration in time range)  insulin aspart (novoLOG) injection 0-15 Units (0 Units Subcutaneous Not Given 01/30/20 1211)  insulin aspart (novoLOG) injection 0-5 Units (has no administration in time range)  amiodarone (PACERONE) tablet 200 mg (200 mg Oral Given 01/30/20 1304)  amLODipine (NORVASC) tablet 10 mg (10 mg Oral Given 01/30/20 1303)  atorvastatin (LIPITOR) tablet 80 mg (80 mg Oral Given 01/30/20 1304)  carvedilol (COREG) tablet 3.125 mg (has no administration in time range)  levothyroxine (SYNTHROID) tablet 100 mcg (has no administration in time range)  apixaban (ELIQUIS) tablet 5 mg (5 mg Oral Given 01/30/20 1304)  sodium chloride 0.9 % bolus 500 mL (0 mLs Intravenous Stopped 01/30/20 0621)  cefTRIAXone (ROCEPHIN) 1 g in sodium chloride 0.9 % 100 mL IVPB (0 g Intravenous Stopped 01/30/20 0621)  cefTRIAXone (ROCEPHIN) 1 g in sodium chloride 0.9 % 100 mL IVPB (0 g Intravenous Stopped 01/30/20 0811)  sodium zirconium cyclosilicate (LOKELMA) packet 10 g (10 g Oral Given 01/30/20 0809)  0.9 %  sodium chloride infusion ( Intravenous New Bag/Given 01/30/20 1524)    Mobility walks

## 2020-01-30 NOTE — ED Provider Notes (Signed)
[facility] DEPT Provider Note   CSN: [id number] Arrival date & time: 01/29/20  2344     History Chief Complaint  Patient presents with  . Arm Problem    Hector [name] is a 54 y.o. male.  HPI     This is a 54 year old male with a history of heart failure, hypertension, hyperlipidemia, atrial fibrillation, alcohol abuse who presents with shaking and shortness of breath.  Patient was hospitalized for COVID-19 in October 2021.  He had a prolonged hospital course and was discharged in November.  He states that he went to bed and then woke up with bilateral upper extremity shaking.  He is also reported some shortness of breath.  No chest pain.  No noted fevers.  Denies any weakness, numbness, tingling, speech difficulty.  Poor historian.  Difficult to obtain history from. Past Medical History:  Diagnosis Date  . Alcohol abuse   . 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 12/2007 MV: EF 28%, no isch;  b. 8/12 Echo: EF 25-35%; c. 02/2014 Echo: EF 20-25%; d. 12/2014 Cath: minimal CAD; e. 01/2015 Echo: EF 50-55%;  d. 05/2016 Echo: EF 30-35%, diff HK, gr2 DD; e. 11/2016 Echo: EF 45-50%, diff HK.  . Chronic combined systolic (congestive) and diastolic (congestive) heart failure ([place])    a. 05/2016 Most recent Echo: EF 30-35%, diff HK, Gr2 DD, mild MR, mod dil LA; b. 11/2016 Echo: EF 45-50%, diff HK.  . CKD (chronic kidney disease), stage III ([place])   . Elevated troponin    Chronically elevated. - level was 0.17-0.10 during recent admission  . Essential hypertension   . GI bleed 11/2013  . Hyperlipidemia   . Pancreatitis   . Paroxysmal A-fib ([place])    a. new onset s/p unsuccessful TEE/DCCV on 08/16/2014; b. on amio/eliquis (CHA2DS2VASc = 2-3); c. reports 1-2 hrs of afib ~ q42mos.  . Paroxysmal atrial flutter ([place])    a. new onset 07/2014; b. s/p unsuccessful TEE/DCCV 08/16/2014; c. on apixaban.  . Sleep-disordered breathing    Has yet to have a sleep study     Patient Active Problem List   Diagnosis Date Noted  . Acute respiratory distress syndrome (ARDS) due to COVID-19 virus ([place]) 12/31/2019  . Acute renal failure superimposed on stage 3b chronic kidney disease ([place]) 12/31/2019  . Hyperkalemia 12/31/2019  . Hyponatremia 12/31/2019  . Morbidly obese ([place]) 12/29/2019  . Pneumonia due to COVID-19 virus 12/22/2019    Class: Acute  . Acute respiratory failure with hypoxemia ([place]) 12/22/2019    Class: Acute  . Severe sepsis ([place]) 12/22/2019    Class: Acute  . COVID-19 12/22/2019  . Chronic systolic heart failure ([name]) 07/21/2016  . Obstructive sleep apnea 04/21/2016  . Nonischemic cardiomyopathy ([place]) 05/26/2015  . Chronic diastolic heart failure ([place]) 04/01/2015  . Essential hypertension 04/01/2015  . Sinus bradycardia 01/29/2015  . Paroxysmal atrial fibrillation ([place]) 08/16/2014  . Paroxysmal atrial flutter ([place]) 08/14/2014    Class: Acute  . Chronic kidney disease (CKD), stage III (moderate) ([place]) 07/02/2014  . Hypertensive heart disease   . Hyperlipidemia     Past Surgical History:  Procedure Laterality Date  . CARDIAC CATHETERIZATION N/A 01/09/2015   Procedure: Left Heart Cath and Coronary Angiography;  Surgeon: [name], MD;  Location: [facility];  Service: Cardiovascular;  Laterality: N/A;  . ELECTROPHYSIOLOGIC STUDY N/A 08/16/2014   Procedure: CARDIOVERSION;  Surgeon: [name], MD;  Location: [facility] ORS;  Service: Cardiovascular;  Laterality:  N/A;  . FLEXIBLE SIGMOIDOSCOPY N/A 10/10/2015   Procedure: FLEXIBLE SIGMOIDOSCOPY;  Surgeon: [name], MD;  Location: [facility];  Service: Endoscopy;  Laterality: N/A;  . KNEE SURGERY Right   . NM MYOVIEW LTD  November 2011   No ischemia or infarction. EF 50-55% ( no improvement from 2009 Myoview EF of 28%  . TEE WITHOUT CARDIOVERSION N/A 08/16/2014   Procedure: TRANSESOPHAGEAL ECHOCARDIOGRAM (TEE);  Surgeon: [name], MD;  Location: [facility] ORS;   Service: Cardiovascular;  Laterality: N/A;       Family History  Problem Relation Age of Onset  . Hypertension Mother   . Hyperlipidemia Mother   . Diabetes Mother     Social History   Tobacco Use  . Smoking status: Former Smoker    Packs/day: 1.00    Years: 12.00    Pack years: 12.00    Quit date: 02/23/1998    Years since quitting: 21.9  . Smokeless tobacco: Never Used  Vaping Use  . Vaping Use: Never used  Substance Use Topics  . Alcohol use: No    Alcohol/week: 0.0 standard drinks    Comment: 4 heavy alcohol drinker. Quit 2 years ago.  . Drug use: No    Home Medications Prior to Admission medications   Medication Sig Start Date End Date Taking? Authorizing Provider  allopurinol (ZYLOPRIM) 100 MG tablet Take 100 mg by mouth daily as needed.    [provider]  amiodarone (PACERONE) 200 MG tablet Take 1 tablet (200 mg total) by mouth daily. 01/03/18   Alisa Graff, FNP  amLODipine (NORVASC) 10 MG tablet Take 1 tablet (10 mg total) by mouth daily. 01/20/20   Enzo Bi, MD  apixaban (ELIQUIS) 5 MG TABS tablet Take 1 tablet (5 mg total) by mouth 2 (two) times daily. 12/01/18   Alisa Graff, FNP  atorvastatin (LIPITOR) 80 MG tablet Take 1 tablet (80 mg total) by mouth daily. 03/15/18   Alisa Graff, FNP  carvedilol (COREG) 3.125 MG tablet Take 1 tablet (3.125 mg total) by mouth 2 (two) times daily with a meal. 03/15/18   Darylene Price A, FNP  diclofenac Sodium (VOLTAREN) 1 % GEL Apply 2 g topically 2 (two) times daily. To left elbow, left forearm. 01/19/20   Enzo Bi, MD  furosemide (LASIX) 40 MG tablet Take 1 tablet (40 mg total) by mouth daily as needed for fluid or edema. Per nephrology recommendation. 01/03/20   Enzo Bi, MD  levothyroxine (SYNTHROID, LEVOTHROID) 100 MCG tablet Take 100 mcg by mouth daily before breakfast.    [provider]  Multiple Vitamin (MULTIVITAMIN WITH MINERALS) TABS tablet Take 1 tablet by mouth daily. 01/20/20   Enzo Bi, MD  Nutritional Supplements (FEEDING SUPPLEMENT, NEPRO CARB STEADY,) LIQD Take 237 mLs by mouth 3 (three) times daily between meals. 01/19/20   Enzo Bi, MD  sacubitril-valsartan (ENTRESTO) 97-103 MG Hold until outpatient followup with cardiology due to acute kidney injury. 01/03/20   Enzo Bi, MD    Allergies    Esomeprazole magnesium and Eggs or egg-derived products  Review of Systems   Review of Systems  Constitutional: Negative for fever.  Respiratory: Positive for shortness of breath. Negative for cough.   Cardiovascular: Negative for chest pain.  Gastrointestinal: Negative for abdominal pain, nausea and vomiting.  Neurological: Positive for tremors. Negative for light-headedness and headaches.  All other systems reviewed and are negative.   Physical Exam Updated Vital Signs BP (!) 143/108   Pulse Marland Kitchen)  108   Temp 98.2 F (36.8 C) (Rectal)   Resp (!) 32   SpO2 93%   Physical Exam Vitals and nursing note reviewed.  Constitutional:      Appearance: He is well-developed.     Comments: Chronically ill-appearing.  HENT:     Head: Normocephalic and atraumatic.     Mouth/Throat:     Mouth: Mucous membranes are moist.  Eyes:     Pupils: Pupils are equal, round, and reactive to light.  Cardiovascular:     Rate and Rhythm: Regular rhythm. Tachycardia present.     Heart sounds: Normal heart sounds. No murmur heard.   Pulmonary:     Effort: Pulmonary effort is normal. No respiratory distress.     Breath sounds: Normal breath sounds. No wheezing.  Abdominal:     General: Bowel sounds are normal.     Palpations: Abdomen is soft.     Tenderness: There is no abdominal tenderness.  Musculoskeletal:     Cervical back: Neck supple.     Comments: Bilateral upper extremity twitching which appears voluntary  Skin:    General: Skin is warm and dry.  Neurological:     Mental Status: He is alert and oriented to person, place, and time.     Comments: 5 out of 5 strength  bilateral upper extremities, cranial nerves II through XII intact  Psychiatric:        Mood and Affect: Mood normal.     ED Results / Procedures / Treatments   Labs (all labs ordered are listed, but only abnormal results are displayed) Labs Reviewed  CBC WITH DIFFERENTIAL/PLATELET - Abnormal; Notable for the following components:      Result Value   WBC 17.7 (*)    Neutro Abs 12.8 (*)    Monocytes Absolute 1.4 (*)    Abs Immature Granulocytes 0.35 (*)    All other components within normal limits  BASIC METABOLIC PANEL - Abnormal; Notable for the following components:   Potassium 5.4 (*)    CO2 14 (*)    Glucose, Bld 127 (*)    BUN 63 (*)    Creatinine, Ser 2.79 (*)    GFR, Estimated 26 (*)    Anion gap 18 (*)    All other components within normal limits  URINALYSIS, ROUTINE W REFLEX MICROSCOPIC - Abnormal; Notable for the following components:   Hgb urine dipstick MODERATE (*)    Protein, ur 100 (*)    Leukocytes,Ua LARGE (*)    All other components within normal limits  URINALYSIS, MICROSCOPIC (REFLEX) - Abnormal; Notable for the following components:   Bacteria, UA MANY (*)    All other components within normal limits  I-STAT CHEM 8, ED - Abnormal; Notable for the following components:   Potassium 5.7 (*)    BUN 80 (*)    Creatinine, Ser 2.90 (*)    Calcium, Ion 1.12 (*)    TCO2 20 (*)    All other components within normal limits  CULTURE, BLOOD (ROUTINE X 2)  CULTURE, BLOOD (ROUTINE X 2)  URINE CULTURE    EKG EKG Interpretation  Date/Time:  Tuesday January 30 2020 06:35:20 EST Ventricular Rate:  111 PR Interval:    QRS Duration: 126 QT Interval:  354 QTC Calculation: 481 R Axis:   11 Text Interpretation: Sinus tachycardia IVCD, consider atypical LBBB Rate faster SImilar morphology to prior Confirmed by Thayer Jew [phone]) on 01/30/2020 6:38:05 AM   Radiology DG Chest 2 View  Result  Date: 01/30/2020 CLINICAL DATA:  Shortness of breath and elevated  white blood cell count EXAM: CHEST - 2 VIEW COMPARISON:  12/28/2019 FINDINGS: Cardiomegaly and vascular pedicle widening with aortic tortuosity. There is improved aeration since prior. Congested appearance of vessels without focal pneumonia. No visible effusion or pneumothorax. IMPRESSION: Cardiomegaly and vascular congestion. Aeration is improved since comparison last month. Electronically Signed   By: Monte Fantasia M.D.   On: 01/30/2020 05:12    Procedures Procedures (including critical care time)  Medications Ordered in ED Medications  sodium zirconium cyclosilicate (LOKELMA) packet 10 g (has no administration in time range)  sodium chloride 0.9 % bolus 500 mL (0 mLs Intravenous Stopped 01/30/20 0621)  cefTRIAXone (ROCEPHIN) 1 g in sodium chloride 0.9 % 100 mL IVPB (0 g Intravenous Stopped 01/30/20 0621)  cefTRIAXone (ROCEPHIN) 1 g in sodium chloride 0.9 % 100 mL IVPB (1 g Intravenous New Bag/Given 01/30/20 0620)    ED Course  I have reviewed the triage vital signs and the nursing notes.  Pertinent labs & imaging results that were available during my care of the patient were reviewed by me and considered in my medical decision making (see chart for details).    MDM Rules/Calculators/A&P                           Patient initially presents with upper extremity shaking.  He also endorses some shortness of breath.  Recent hospital admission for COVID-19 was discharged to living facility.  Initial vital signs he is afebrile.  He has some slight tachycardia to the 110s.  Initial screening labs obtained.  He has a leukocytosis to 17.7.  He also has some metabolic derangements with potassium of 5.4, CO2 14, BUN elevated at 63 and creatinine trending upwards at 2.79.  He has an extensive history of heart disease but clinically appears somewhat dry.  He was given a 500 cc bolus.  He was transition to the acute care area and additional work-up was ordered.  Chest x-ray does not show any evidence of  pneumonia and overall improved aeration although he does appear volume overloaded on chest x-ray.  O2 sats are in the low 90s.  No indication to repeat Covid testing.  Urinalysis shows 1-50 white cells and many bacteria.  Culture was sent.  This appears to be a clean specimen.  We will treat for UTI with Rocephin.  Patient is in no distress; however, remains slightly tachycardic and tachypneic.  I ordered a repeat Chem-8 to see where his potassium and creatinine were trending.  Potassium yesterday up to 5.7 and creatinine is now 2.9.  BUN at 80.  He is a difficult patient to stabilize as he has significant heart failure and appears to have pulmonary edema; however clinically appears dry and likely needs of hydration.  For this reason we will plan for admission for ongoing treatment of UTI and fluid management.    Final Clinical Impression(s) / ED Diagnoses Final diagnoses:  Acute cystitis without hematuria  Uremia  AKI (acute kidney injury) ([place])  Hyperkalemia    Rx / DC Orders ED Discharge Orders    None       Hector [name], Barbette Hair, MD 01/30/20 [phone]

## 2020-01-30 NOTE — Progress Notes (Signed)
EEG complete - results pending 

## 2020-01-30 NOTE — ED Notes (Signed)
Bloodwork collection attempted x1 by this RN.  Unsuccessful blood draw, pt refusing to allow for second attempt.  Another staff member will be asked to try.

## 2020-01-30 NOTE — ED Notes (Signed)
Facility called and stated the twitching started around 8pm last night and this is new.

## 2020-01-30 NOTE — Procedures (Signed)
Patient Name: Hector [name]  MRN: [id number]  Epilepsy Attending: Lora Havens  Referring Physician/Provider: Dr Cherylann Ratel Date: 01/30/2020 Duration: 24.26 mins  Patient history: 54yo M with upper extremity shaking. EEG to evaluate for seizure  Level of alertness: Awake,  Asleep  AEDs during EEG study: None  Technical aspects: This EEG study was done with scalp electrodes positioned according to the 10-20 International system of electrode placement. Electrical activity was acquired at a sampling rate of 500Hz  and reviewed with a high frequency filter of 70Hz  and a low frequency filter of 1Hz . EEG data were recorded continuously and digitally stored.   Description: The posterior dominant rhythm consists of 8 Hz activity of moderate voltage (25-35 uV) seen predominantly in posterior head regions, symmetric and reactive to eye opening and eye closing. Sleep was characterized by vertex waves, sleep spindles (12 to 14 Hz), maximal frontocentral region. Hyperventilation and photic stimulation were not performed.     IMPRESSION: This study is within normal limits. No seizures or epileptiform discharges were seen throughout the recording.  Mannix Kroeker Barbra Sarks

## 2020-01-30 NOTE — H&P (Addendum)
History and Physical    Hector [name] AST:[id number] DOB: [birth year] DOA: 01/30/2020  PCP: Hector Median, MD  Patient coming from: Hector [name]  Chief Complaint: Upper extremity shaking  HPI: Hector [name] is [name] 54 y.o. male with medical history significant of alcoholic cardiomyopathy, HTN, [name] fib, COVID. Presenting with upper extremity shaking for the last 12 hours. States that he woke up about 8:30 last night with shakes, upper torso jerking. He is not sure what started it. He has tried no treatment for it. The jerks involve both upper extremities and are intense enough to move his upper torso and head. It is not painful. It has continued through this morning. His facility became concerned and sent him to the ED. He denies any other alleviating or aggravating factors. He denies any other treatments.    ED Course: ED eval showed hyperkalemia, elevated BUN and creatinine. He was initial Scr/BUN was at baseline, but both increased later. Got some fluids. CXR with vascular congestion and he c/o some dyspnea. He was given lokelma. TRH was called for admission.   Review of Systems:  Denies CP, palpitations, N, V, F, HA, syncopal episodes.  Review of systems is otherwise negative for all not mentioned in HPI.   PMHx Past Medical History:  Diagnosis Date  . Alcohol abuse   . 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 12/2007 MV: EF 28%, no isch;  b. 8/12 Echo: EF 25-35%; c. 02/2014 Echo: EF 20-25%; d. 12/2014 Cath: minimal CAD; e. 01/2015 Echo: EF 50-55%;  d. 05/2016 Echo: EF 30-35%, diff HK, gr2 DD; e. 11/2016 Echo: EF 45-50%, diff HK.  . Chronic combined systolic (congestive) and diastolic (congestive) heart failure ([place])    [name]. 05/2016 Most recent Echo: EF 30-35%, diff HK, Gr2 DD, mild MR, mod dil LA; b. 11/2016 Echo: EF 45-50%, diff HK.  . CKD (chronic kidney disease), stage III ([place])   . Elevated troponin    Chronically elevated. - level was 0.17-0.10 during recent admission  .  Essential hypertension   . GI bleed 11/2013  . Hyperlipidemia   . Pancreatitis   . Paroxysmal [name]-fib ([place])    [name]. new onset s/p unsuccessful TEE/DCCV on 08/16/2014; b. on amio/eliquis (CHA2DS2VASc = 2-3); c. reports 1-2 hrs of afib ~ q56mos.  . Paroxysmal atrial flutter ([place])    [name]. new onset 07/2014; b. s/p unsuccessful TEE/DCCV 08/16/2014; c. on apixaban.  . Sleep-disordered breathing    Has yet to have [name] sleep study    PSHx Past Surgical History:  Procedure Laterality Date  . CARDIAC CATHETERIZATION N/[name] 01/09/2015   Procedure: Left Heart Cath and Coronary Angiography;  Surgeon: [name], MD;  Location: [facility];  Service: Cardiovascular;  Laterality: N/[name];  . ELECTROPHYSIOLOGIC STUDY N/[name] 08/16/2014   Procedure: CARDIOVERSION;  Surgeon: Hector Merritts, MD;  Location: [facility]S;  Service: Cardiovascular;  Laterality: N/[name];  . FLEXIBLE SIGMOIDOSCOPY N/[name] 10/10/2015   Procedure: FLEXIBLE SIGMOIDOSCOPY;  Surgeon: Hector Sails, MD;  Location: [facility] ENDOSCOPY;  Service: Endoscopy;  Laterality: N/[name];  . KNEE SURGERY Right   . NM MYOVIEW LTD  November 2011   No ischemia or infarction. EF 50-55% ( no improvement from 2009 Myoview EF of 28%  . TEE WITHOUT CARDIOVERSION N/[name] 08/16/2014   Procedure: TRANSESOPHAGEAL ECHOCARDIOGRAM (TEE);  Surgeon: Hector Merritts, MD;  Location: [facility] ORS;  Service: Cardiovascular;  Laterality: N/[name];    SocHx  reports that he quit smoking about 21 years ago. He  has [name] 12.00 pack-year smoking history. He has never used smokeless tobacco. He reports that he does not drink alcohol and does not use drugs.  Allergies  Allergen Reactions  . Esomeprazole Magnesium Other (See Comments)    Suspected interstitial nephritis 2018  . Eggs Or Egg-Derived Products Rash    FamHx Family History  Problem Relation Age of Onset  . Hypertension Mother   . Hyperlipidemia Mother   . Diabetes Mother     Prior to Admission medications   Medication Sig Start Date  End Date Taking? Authorizing Provider  allopurinol (ZYLOPRIM) 100 MG tablet Take 100 mg by mouth daily as needed.    [provider]  amiodarone (PACERONE) 200 MG tablet Take 1 tablet (200 mg total) by mouth daily. 01/03/18   Hector Graff, FNP  amLODipine (NORVASC) 10 MG tablet Take 1 tablet (10 mg total) by mouth daily. 01/20/20   Hector Bi, MD  apixaban (ELIQUIS) 5 MG TABS tablet Take 1 tablet (5 mg total) by mouth 2 (two) times daily. 12/01/18   Hector Graff, FNP  atorvastatin (LIPITOR) 80 MG tablet Take 1 tablet (80 mg total) by mouth daily. 03/15/18   Hector Graff, FNP  carvedilol (COREG) 3.125 MG tablet Take 1 tablet (3.125 mg total) by mouth 2 (two) times daily with [name] meal. 03/15/18   Hector Price A, FNP  diclofenac Sodium (VOLTAREN) 1 % GEL Apply 2 g topically 2 (two) times daily. To left elbow, left forearm. 01/19/20   Hector Bi, MD  furosemide (LASIX) 40 MG tablet Take 1 tablet (40 mg total) by mouth daily as needed for fluid or edema. Per nephrology recommendation. 01/03/20   Hector Bi, MD  levothyroxine (SYNTHROID, LEVOTHROID) 100 MCG tablet Take 100 mcg by mouth daily before breakfast.    [provider]  Multiple Vitamin (MULTIVITAMIN WITH MINERALS) TABS tablet Take 1 tablet by mouth daily. 01/20/20   Hector Bi, MD  Nutritional Supplements (FEEDING SUPPLEMENT, NEPRO CARB STEADY,) LIQD Take 237 mLs by mouth 3 (three) times daily between meals. 01/19/20   Hector Bi, MD  sacubitril-valsartan (ENTRESTO) 97-103 MG Hold until outpatient followup with cardiology due to acute kidney injury. 01/03/20   Hector Bi, MD    Physical Exam: Vitals:   01/30/20 0417 01/30/20 0546 01/30/20 0600 01/30/20 0700  BP:  (!) 127/113 (!) 138/113 (!) 143/108  Pulse:  (!) 111 (!) 108 (!) 108  Resp:  (!) 23 20 (!) 32  Temp: 98.2 F (36.8 C)     TempSrc: Rectal     SpO2:  95% 96% 93%    General: 54 y.o. male resting in bed in NAD Eyes: PERRL, normal sclera ENMT: Nares patent w/o  discharge, orophaynx clear, dentition normal, ears w/o discharge/lesions/ulcers Neck: Supple, trachea midline Cardiovascular: tachy, +S1, S2, no m/g/r, equal pulses throughout Respiratory: CTABL, no w/r/r, normal WOB GI: BS+, NDNT, no masses noted, no organomegaly noted MSK: No e/c/c Skin: No rashes, bruises, ulcerations noted Neuro: [name]&O x 3 (person, place, president), torse/BUE twitching, asterixis Psyc: Appropriate interaction but flat affect affect, calm/cooperative  Labs on Admission: I have personally reviewed following labs and imaging studies  CBC: Recent Labs  Lab 01/30/20 0210 01/30/20 0701  WBC 17.7*  --   NEUTROABS 12.8*  --   HGB 14.6 14.3  HCT 47.3 42.0  MCV 91.5  --   PLT 315  --    Basic Metabolic Panel: Recent Labs  Lab 01/30/20 0210 01/30/20 0701  NA 136 136  K 5.4* 5.7*  CL 104 106  CO2 14*  --   GLUCOSE 127* 84  BUN 63* 80*  CREATININE 2.79* 2.90*  CALCIUM 10.1  --    GFR: Estimated Creatinine Clearance: 38.6 mL/min ([name]) (by C-G formula based on SCr of 2.9 mg/dL (H)). Liver Function Tests: No results for input(s): AST, ALT, ALKPHOS, BILITOT, PROT, ALBUMIN in the last 168 hours. No results for input(s): LIPASE, AMYLASE in the last 168 hours. No results for input(s): AMMONIA in the last 168 hours. Coagulation Profile: No results for input(s): INR, PROTIME in the last 168 hours. Cardiac Enzymes: No results for input(s): CKTOTAL, CKMB, CKMBINDEX, TROPONINI in the last 168 hours. BNP (last 3 results) No results for input(s): PROBNP in the last 8760 hours. HbA1C: No results for input(s): HGBA1C in the last 72 hours. CBG: No results for input(s): GLUCAP in the last 168 hours. Lipid Profile: No results for input(s): CHOL, HDL, LDLCALC, TRIG, CHOLHDL, LDLDIRECT in the last 72 hours. Thyroid Function Tests: No results for input(s): TSH, T4TOTAL, FREET4, T3FREE, THYROIDAB in the last 72 hours. Anemia Panel: No results for input(s): VITAMINB12, FOLATE,  FERRITIN, TIBC, IRON, RETICCTPCT in the last 72 hours. Urine analysis:    Component Value Date/Time   COLORURINE YELLOW 01/30/2020 0326   APPEARANCEUR CLEAR 01/30/2020 0326   APPEARANCEUR Clear 02/26/2014 1126   LABSPEC 1.020 01/30/2020 0326   LABSPEC 1.008 02/26/2014 1126   PHURINE 5.5 01/30/2020 0326   GLUCOSEU NEGATIVE 01/30/2020 0326   GLUCOSEU Negative 02/26/2014 1126   HGBUR MODERATE ([name]) 01/30/2020 0326   BILIRUBINUR NEGATIVE 01/30/2020 0326   BILIRUBINUR Negative 02/26/2014 1126   KETONESUR NEGATIVE 01/30/2020 0326   PROTEINUR 100 ([name]) 01/30/2020 0326   NITRITE NEGATIVE 01/30/2020 0326   LEUKOCYTESUR LARGE ([name]) 01/30/2020 0326   LEUKOCYTESUR 1+ 02/26/2014 1126    Radiological Exams on Admission: DG Chest 2 View  Result Date: 01/30/2020 CLINICAL DATA:  Shortness of breath and elevated white blood cell count EXAM: CHEST - 2 VIEW COMPARISON:  12/28/2019 FINDINGS: Cardiomegaly and vascular pedicle widening with aortic tortuosity. There is improved aeration since prior. Congested appearance of vessels without focal pneumonia. No visible effusion or pneumothorax. IMPRESSION: Cardiomegaly and vascular congestion. Aeration is improved since comparison last month. Electronically Signed   By: Monte Fantasia M.D.   On: 01/30/2020 05:12    EKG: Independently reviewed. Sinus tach, no st elevation  Assessment/Plan Asterixis     - admit to inpatient, telemetry     - check UDS, LFTs/ammonia levels     - has [name] UTI and has elevated BUN; these are also good reasons to have asterixis     - treat metabolic issues, and follow course     - EEG  UTI     - UCx pending     - started on rocephin, continue  AKI on [id number] Metabolic acidosis     - gentle fluids as CXR shows vascular congestion     - also check BNP, lactic acid     - watch nephrotoxins  Hyperkalemia     - lokelma, follow  Chronic combined HF     - watch fluid status, daily wts, I&O     - getting gentle hydration for  elevated BUN/SCr, follow     - was previously on entresto, but held by nephrology last admission until he is able to follow up with cardiology  [name] fib     - on eliquis, amiodarone, coreg; per discharge from last admission     -  resume once med rec confirmed  HLD     - resume lipitor when med rec confirmed  DM2     - SSI, DM diet, glucose checks     - recent A1c 7.7   DVT prophylaxis: eliquis  Code Status: FULL  Family Communication: None at bedside.   Consults called: Side barred neurology (Dr. Leonel Ramsay)  Status is: Inpatient  Remains inpatient appropriate because:Inpatient level of care appropriate due to severity of illness   Dispo: The patient is from: SNF              Anticipated d/c is to: SNF              Anticipated d/c date is: 2 days              Patient currently is not medically stable to d/c.  Jonnie Finner DO [facility]  If 7PM-7AM, please contact night-coverage [email or web address]  01/30/2020, 7:51 AM

## 2020-01-31 ENCOUNTER — Inpatient Hospital Stay (HOSPITAL_COMMUNITY): Payer: Medicaid Other

## 2020-01-31 DIAGNOSIS — I5031 Acute diastolic (congestive) heart failure: Secondary | ICD-10-CM

## 2020-01-31 DIAGNOSIS — N179 Acute kidney failure, unspecified: Principal | ICD-10-CM

## 2020-01-31 DIAGNOSIS — N3000 Acute cystitis without hematuria: Secondary | ICD-10-CM

## 2020-01-31 DIAGNOSIS — E875 Hyperkalemia: Secondary | ICD-10-CM

## 2020-01-31 DIAGNOSIS — I5033 Acute on chronic diastolic (congestive) heart failure: Secondary | ICD-10-CM

## 2020-01-31 LAB — CBC
HCT: 37.8 % — ABNORMAL LOW (ref 39.0–52.0)
Hemoglobin: 12.1 g/dL — ABNORMAL LOW (ref 13.0–17.0)
MCH: 28.6 pg (ref 26.0–34.0)
MCHC: 32 g/dL (ref 30.0–36.0)
MCV: 89.4 fL (ref 80.0–100.0)
Platelets: 247 10*3/uL (ref 150–400)
RBC: 4.23 MIL/uL (ref 4.22–5.81)
RDW: 15.3 % (ref 11.5–15.5)
WBC: 10.6 10*3/uL — ABNORMAL HIGH (ref 4.0–10.5)
nRBC: 0 % (ref 0.0–0.2)

## 2020-01-31 LAB — COMPREHENSIVE METABOLIC PANEL
ALT: 41 U/L (ref 0–44)
AST: 34 U/L (ref 15–41)
Albumin: 3.1 g/dL — ABNORMAL LOW (ref 3.5–5.0)
Alkaline Phosphatase: 84 U/L (ref 38–126)
Anion gap: 15 (ref 5–15)
BUN: 60 mg/dL — ABNORMAL HIGH (ref 6–20)
CO2: 16 mmol/L — ABNORMAL LOW (ref 22–32)
Calcium: 8.9 mg/dL (ref 8.9–10.3)
Chloride: 103 mmol/L (ref 98–111)
Creatinine, Ser: 2.51 mg/dL — ABNORMAL HIGH (ref 0.61–1.24)
GFR, Estimated: 29 mL/min — ABNORMAL LOW (ref >60)
Glucose, Bld: 135 mg/dL — ABNORMAL HIGH (ref 70–99)
Potassium: 4.3 mmol/L (ref 3.5–5.1)
Sodium: 134 mmol/L — ABNORMAL LOW (ref 135–145)
Total Bilirubin: 1.2 mg/dL (ref 0.3–1.2)
Total Protein: 6.3 g/dL — ABNORMAL LOW (ref 6.5–8.1)

## 2020-01-31 LAB — URINE CULTURE

## 2020-01-31 LAB — ECHOCARDIOGRAM COMPLETE
Area-P 1/2: 4.49 cm2
Calc EF: 48.9 %
Height: 71 in
S' Lateral: 3.6 cm
Single Plane A2C EF: 52.6 %
Single Plane A4C EF: 47 %
Weight: 3654.34 oz

## 2020-01-31 LAB — GLUCOSE, CAPILLARY
Glucose-Capillary: 80 mg/dL (ref 70–99)
Glucose-Capillary: 102 mg/dL — ABNORMAL HIGH (ref 70–99)
Glucose-Capillary: 106 mg/dL — ABNORMAL HIGH (ref 70–99)
Glucose-Capillary: 109 mg/dL — ABNORMAL HIGH (ref 70–99)

## 2020-01-31 MED ORDER — PERFLUTREN LIPID MICROSPHERE
1.0000 mL | INTRAVENOUS | Status: AC | PRN
  Administered 2020-01-31: 2 mL via INTRAVENOUS

## 2020-01-31 MED ORDER — FUROSEMIDE 10 MG/ML IJ SOLN
40.0000 mg | Freq: Once | INTRAMUSCULAR | Status: AC
  Administered 2020-01-31: 40 mg via INTRAVENOUS
  Filled 2020-01-31: qty 4

## 2020-01-31 MED ORDER — HYDRALAZINE HCL 20 MG/ML IJ SOLN
5.0000 mg | Freq: Four times a day (QID) | INTRAMUSCULAR | Status: DC | PRN
  Administered 2020-02-01 – 2020-02-05 (×2): 5 mg via INTRAVENOUS
  Filled 2020-01-31 (×2): qty 1

## 2020-01-31 NOTE — Progress Notes (Signed)
[facility]  PROGRESS NOTE  DEVERICK PRUSS PXT:[id number] DOB: 09-12-1965 DOA: 01/30/2020 PCP: [name], MD Admit date - 01/30/2020   Admitting Physician Jonnie Finner, DO  Outpatient Primary MD for the patient is [name], MD  LOS - 1 Brief Narrative   THIERNO HUN is a 54 y.o. year old male with medical history significant for atrial fibrillation on Eliquis, CHF, diabetes, CKD stage III who presented on 01/30/2020 with upper extremity tremors and was found to have UTI, acute on chronic diastolic CHF, and poorly controlled hypertension  Subjective  Today states at his nose he noticed tremors/something beating very fast.  Denies any recurrent episodes here.  No chest pain.  Denies any dyspnea.  A & P   Acute on chronic diastolic CHF exacerbation. Has history of combined CHF.  TTE.  Shows preserved EF with indeterminate diastolic function on admission evidence of vascular congestion on chest x-ray seems consistent with exacerbation.  Patient not expressing any shortness of breath but did report tremors prior to admission.  Likely nidus poorly controlled blood pressure given poorly controlled diastolic blood pressure -IV Lasix 40 mg x 1, monitor response -Monitor volume status -Daily weights, strict I's and O's -Closely monitor blood pressure -Continue home Coreg, currently holding Entresto given presented with AKI  Hypertension, not at goal.  DBP remains greater than 110 -Continue amlodipine 10 mg, Coreg 3.125 mg -Currently holding home Entresto on admission due to previous history of AKI -Check TSH -IV hydralazine as needed  Hypothyroidism -Check TSH-continue home Synthroid  Reported tremors.  Not currently having tremors.  Ammonia within normal limits.  Suspect related to elevated BUN but this seems chronic in etiology and currently CKD is stable. EEG unremarkable -Discontinue lactulose --Continue to monitor  AKI on CKD.  Baseline  creatinine prior to admission in November 2 0.2-2.4.  Creatinine of 2.9 on admission not related to CHF extubation and poorly controlled blood pressure. Presented with hyperkalemia that resolved with lactulose --Continue to hold home sacubitril/valsartan -Avoid further nephrotoxins -Monitor output -Monitor BMP  Suspected UTI based off UA -Monitor urine culture -Continue IV ceftriaxone  Type 2 diabetes -Sliding scale insulin, monitor CBG  Atrial fibrillation, currently rate controlled-monitor on telemetry -Continue amiodarone and Eliquis  HLD, stable-continue statin    Family Communication  :  none  Code Status :  FULL  Disposition Plan  :  Patient is from home. Anticipated d/c date: 2 to 3 days. Barriers to d/c or necessity for inpatient status: needs IV lasix for CHF flare, monitoring BP, IV ceftriaxone for UTI Consults  :  none  Procedures  :  TTE, 01/31/20  DVT Prophylaxis  :  eliquis  MDM: The below labs and imaging reports were reviewed and summarized above.  Medication management as above.  Lab Results  Component Value Date   PLT 247 01/31/2020    Diet :  Diet Order            Diet heart healthy/carb modified Room service appropriate? No; Fluid consistency: Thin  Diet effective now                  Inpatient Medications Scheduled Meds: . amiodarone  200 mg Oral Daily  . amLODipine  10 mg Oral Daily  . apixaban  5 mg Oral BID  . atorvastatin  80 mg Oral Daily  . carvedilol  3.125 mg Oral BID WC  . insulin aspart  0-15 Units Subcutaneous TID WC  . insulin  aspart  0-5 Units Subcutaneous QHS  . levothyroxine  100 mcg Oral QAC breakfast   Continuous Infusions: . cefTRIAXone (ROCEPHIN)  IV 1 g (01/31/20 0758)   PRN Meds:.acetaminophen **OR** acetaminophen, hydrALAZINE  Antibiotics  :   Anti-infectives (From admission, onward)   Start     Dose/Rate Route Frequency Ordered Stop   01/31/20 0800  cefTRIAXone (ROCEPHIN) 1 g in sodium chloride 0.9 % 100 mL  IVPB        1 g 200 mL/hr over 30 Minutes Intravenous Every 24 hours 01/30/20 1517     01/30/20 0530  cefTRIAXone (ROCEPHIN) 1 g in sodium chloride 0.9 % 100 mL IVPB        1 g 200 mL/hr over 30 Minutes Intravenous  Once 01/30/20 0515 01/30/20 0811   01/30/20 0500  cefTRIAXone (ROCEPHIN) 1 g in sodium chloride 0.9 % 100 mL IVPB        1 g 200 mL/hr over 30 Minutes Intravenous  Once 01/30/20 0453 01/30/20 0621       Objective   Vitals:   01/31/20 1140 01/31/20 1338 01/31/20 1557 01/31/20 2025  BP: (!) 165/115 (!) 143/103 (!) 130/92 (!) 131/100  Pulse: (!) 105 (!) 107 (!) 104 (!) 102  Resp: 20 (!) 24 (!) 23 (!) 24  Temp: 99.3 F (37.4 C) 98.5 F (36.9 C) 98 F (36.7 C) 97.7 F (36.5 C)  TempSrc: Oral Oral Oral Oral  SpO2: 90% 92% 94% 91%  Weight:      Height:        SpO2: 91 %  Wt Readings from Last 3 Encounters:  01/31/20 103.6 kg  01/23/20 124.3 kg  01/12/20 117.7 kg     Intake/Output Summary (Last 24 hours) at 01/31/2020 2047 Last data filed at 01/31/2020 1924 Gross per 24 hour  Intake 1060 ml  Output 2400 ml  Net -1340 ml    Physical Exam:   Awake Alert, Oriented X 3, Normal affect No new F.N deficits,  [place].AT, Normal respiratory effort on room air, slight crackles at bases Irregularly irregular, normal rate, no peripheral edema +ve B.Sounds, Abd Soft, No tenderness, No rebound, guarding or rigidity. No Cyanosis, No new Rash or bruise    I have personally reviewed the following:   Data Reviewed:  CBC Recent Labs  Lab 01/30/20 0210 01/30/20 0701 01/31/20 1002  WBC 17.7*  --  10.6*  HGB 14.6 14.3 12.1*  HCT 47.3 42.0 37.8*  PLT 315  --  247  MCV 91.5  --  89.4  MCH 28.2  --  28.6  MCHC 30.9  --  32.0  RDW 15.3  --  15.3  LYMPHSABS 2.9  --   --   MONOABS 1.4*  --   --   EOSABS 0.1  --   --   BASOSABS 0.1  --   --     Chemistries  Recent Labs  Lab 01/30/20 0210 01/30/20 0701 01/30/20 1038 01/31/20 1002  NA 136 136  --  134*  K  5.4* 5.7*  --  4.3  CL 104 106  --  103  CO2 14*  --   --  16*  GLUCOSE 127* 84  --  135*  BUN 63* 80*  --  60*  CREATININE 2.79* 2.90*  --  2.51*  CALCIUM 10.1  --   --  8.9  AST  --   --  40 34  ALT  --   --  45* 41  ALKPHOS  --   --  100 84  BILITOT  --   --  1.5* 1.2   ------------------------------------------------------------------------------------------------------------------ No results for input(s): CHOL, HDL, LDLCALC, TRIG, CHOLHDL, LDLDIRECT in the last 72 hours.  Lab Results  Component Value Date   HGBA1C 7.7 (H) 12/23/2019   ------------------------------------------------------------------------------------------------------------------ No results for input(s): TSH, T4TOTAL, T3FREE, THYROIDAB in the last 72 hours.  Invalid input(s): FREET3 ------------------------------------------------------------------------------------------------------------------ No results for input(s): VITAMINB12, FOLATE, FERRITIN, TIBC, IRON, RETICCTPCT in the last 72 hours.  Coagulation profile No results for input(s): INR, PROTIME in the last 168 hours.  No results for input(s): DDIMER in the last 72 hours.  Cardiac Enzymes No results for input(s): CKMB, TROPONINI, MYOGLOBIN in the last 168 hours.  Invalid input(s): CK ------------------------------------------------------------------------------------------------------------------    Component Value Date/Time   BNP 74.1 01/30/2020 1038    Micro Results Recent Results (from the past 240 hour(s))  Urine culture     Status: Abnormal   Collection Time: 01/30/20  3:26 AM   Specimen: Urine, Random  Result Value Ref Range Status   Specimen Description   Final    URINE, RANDOM Performed at [facility] [address], [place], [place] [address]    Special Requests   Final    NONE Performed at [facility], [place] [address], [place], [place] [address]    Culture MULTIPLE SPECIES PRESENT,  SUGGEST RECOLLECTION (A)  Final   Report Status 01/31/2020 FINAL  Final  Blood culture (routine x 2)     Status: None (Preliminary result)   Collection Time: 01/30/20  4:45 AM   Specimen: BLOOD RIGHT HAND  Result Value Ref Range Status   Specimen Description   Final    BLOOD RIGHT HAND Performed at [facility] [address] [place], [place] [address]    Special Requests   Final    BOTTLES DRAWN AEROBIC AND ANAEROBIC Blood Culture results may not be optimal due to an inadequate volume of blood received in culture bottles Performed at [facility] [address], [place], [place] [address]    Culture   Final    NO GROWTH 1 DAY Performed at [facility], [place] [address] [place], [place] [address]    Report Status PENDING  Incomplete  Blood culture (routine x 2)     Status: None (Preliminary result)   Collection Time: 01/30/20 10:38 AM   Specimen: BLOOD  Result Value Ref Range Status   Specimen Description   Final    BLOOD BLOOD LEFT FOREARM Performed at [facility] [address], [place], [place] [address]    Special Requests   Final    BOTTLES DRAWN AEROBIC AND ANAEROBIC Blood Culture adequate volume Performed at [facility] [address], [place], [place] [address]    Culture   Final    NO GROWTH < 24 HOURS Performed at [facility] [address] [place], [place] [address]    Report Status PENDING  Incomplete    Radiology Reports DG Chest 2 View  Result Date: 01/30/2020 CLINICAL DATA:  Shortness of breath and elevated white blood cell count EXAM: CHEST - 2 VIEW COMPARISON:  12/28/2019 FINDINGS: Cardiomegaly and vascular pedicle widening with aortic tortuosity. There is improved aeration since prior. Congested appearance of vessels without focal pneumonia. No visible effusion or pneumothorax. IMPRESSION: Cardiomegaly and vascular congestion. Aeration is improved since  comparison last month. Electronically Signed   By: Neva Seat.D.  On: 01/30/2020 05:12   DG Hand 2 View Left  Result Date: 01/10/2020 CLINICAL DATA:  Left hand pain and swelling EXAM: LEFT HAND - 2 VIEW COMPARISON:  None. FINDINGS: There is no evidence of fracture or dislocation. There is no evidence of arthropathy or other focal bone abnormality. Soft tissues are unremarkable. IMPRESSION: Negative. Electronically Signed   By: Prudencio Pair M.D.   On: 01/10/2020 15:50   US Venous Img Upper Uni Left (DVT)  Result Date: 01/07/2020 CLINICAL DATA:  Pain left arm x2 weeks EXAM: LEFT UPPER EXTREMITY VENOUS DOPPLER ULTRASOUND TECHNIQUE: Gray-scale sonography with graded compression, as well as color Doppler and duplex ultrasound were performed to evaluate the upper extremity deep venous system from the level of the subclavian vein and including the jugular, axillary, basilic, radial, ulnar and upper cephalic vein. Spectral Doppler was utilized to evaluate flow at rest and with distal augmentation maneuvers. COMPARISON:  None. FINDINGS: Contralateral Subclavian Vein: Respiratory phasicity is normal and symmetric with the symptomatic side. No evidence of thrombus. Normal compressibility. Internal Jugular Vein: No evidence of thrombus. Normal compressibility, respiratory phasicity and response to augmentation. Subclavian Vein: No evidence of thrombus. Normal compressibility, respiratory phasicity and response to augmentation. Axillary Vein: No evidence of thrombus. Normal compressibility, respiratory phasicity and response to augmentation. Cephalic Vein: No evidence of thrombus. Normal compressibility, respiratory phasicity and response to augmentation. Basilic Vein: No evidence of thrombus. Normal compressibility, respiratory phasicity and response to augmentation. Brachial Veins: No evidence of thrombus. Normal compressibility, respiratory phasicity and response to augmentation. Radial Veins: No evidence of  thrombus. Normal compressibility, respiratory phasicity and response to augmentation. Ulnar Veins: No evidence of thrombus. Normal compressibility, respiratory phasicity and response to augmentation. Other Findings:  None visualized. IMPRESSION: No evidence of DVT within the left upper extremity. Electronically Signed   By: Constance Holster M.D.   On: 01/07/2020 18:44   EEG adult  Result Date: 01/30/2020 Lora Havens, MD     01/30/2020  8:55 PM Patient Name: Hector [name] MRN: [id number] Epilepsy Attending: Lora Havens Referring Physician/Provider: Dr Cherylann Ratel Date: 01/30/2020 Duration: 24.26 mins Patient history: 54yo M with upper extremity shaking. EEG to evaluate for seizure Level of alertness: Awake,  Asleep AEDs during EEG study: None Technical aspects: This EEG study was done with scalp electrodes positioned according to the 10-20 International system of electrode placement. Electrical activity was acquired at a sampling rate of 500Hz  and reviewed with a high frequency filter of 70Hz  and a low frequency filter of 1Hz . EEG data were recorded continuously and digitally stored. Description: The posterior dominant rhythm consists of 8 Hz activity of moderate voltage (25-35 uV) seen predominantly in posterior head regions, symmetric and reactive to eye opening and eye closing. Sleep was characterized by vertex waves, sleep spindles (12 to 14 Hz), maximal frontocentral region. Hyperventilation and photic stimulation were not performed.   IMPRESSION: This study is within normal limits. No seizures or epileptiform discharges were seen throughout the recording. Lora Havens   ECHOCARDIOGRAM COMPLETE  Result Date: 01/31/2020    ECHOCARDIOGRAM REPORT   Patient Name:   DELMUS WARWICK Date of Exam: 01/31/2020 Medical Rec #:  [id number]           Height:       71.0 in Accession #:    [id number]          Weight:       228.4 lb Date of Birth:  [birth year]  BSA:          2.231 m Patient  Age:    55 years            BP:           145/108 mmHg Patient Gender: M                   HR:           108 bpm. Exam Location:  Inpatient Procedure: 2D Echo, Cardiac Doppler, Color Doppler and Intracardiac            Opacification Agent Indications:    CHF-Acute Diastolic 109.32 / T55.73  History:        Patient has prior history of Echocardiogram examinations, most                 recent 12/03/2016. Cardiomyopathy, Arrythmias:Atrial                 Fibrillation and Atrial Flutter; Risk Factors:Hypertension,                 Dyslipidemia and Former Smoker. Elevated troponin.  Sonographer:    Vickie Epley RDCS Referring Phys: [id number] [facility] D Willette Mudry  Sonographer Comments: Image acquisition challenging due to respiratory motion. IMPRESSIONS  1. Left ventricular ejection fraction, by estimation, is 45 to 50%. The left ventricle has normal function. The left ventricle demonstrates regional wall motion abnormalities. Abnormal (paradoxical) septal motion, consistent with left bundle branch block. Left ventricular diastolic parameters are indeterminate.  2. Right ventricular systolic function is normal. The right ventricular size is normal. Tricuspid regurgitation signal is inadequate for assessing PA pressure.  3. The mitral valve is normal in structure. No evidence of mitral valve regurgitation.  4. The aortic valve was not well visualized. Aortic valve regurgitation is not visualized. No aortic stenosis is present.  5. The inferior vena cava is normal in size with greater than 50% respiratory variability, suggesting right atrial pressure of 3 mmHg. FINDINGS  Left Ventricle: Left ventricular ejection fraction, by estimation, is 45 to 50%. The left ventricle has normal function. The left ventricle demonstrates regional wall motion abnormalities. Definity contrast agent was given IV to delineate the left ventricular endocardial borders. The left ventricular internal cavity size was normal in size. There is no left  ventricular hypertrophy. Abnormal (paradoxical) septal motion, consistent with left bundle branch block. Left ventricular diastolic parameters  are indeterminate. Right Ventricle: The right ventricular size is normal. Right vetricular wall thickness was not assessed. Right ventricular systolic function is normal. Tricuspid regurgitation signal is inadequate for assessing PA pressure. Left Atrium: Left atrial size was normal in size. Right Atrium: Right atrial size was normal in size. Pericardium: There is no evidence of pericardial effusion. Mitral Valve: The mitral valve is normal in structure. No evidence of mitral valve regurgitation. Tricuspid Valve: The tricuspid valve is normal in structure. Tricuspid valve regurgitation is not demonstrated. Aortic Valve: The aortic valve was not well visualized. Aortic valve regurgitation is not visualized. No aortic stenosis is present. Pulmonic Valve: The pulmonic valve was not well visualized. Pulmonic valve regurgitation is not visualized. Aorta: The aortic root is normal in size and structure. Venous: The inferior vena cava is normal in size with greater than 50% respiratory variability, suggesting right atrial pressure of 3 mmHg. IAS/Shunts: The interatrial septum was not well visualized.  LEFT VENTRICLE PLAX 2D LVIDd:         4.60 cm      Diastology  LVIDs:         3.60 cm      LV e' medial:    5.19 cm/s LV PW:         1.00 cm      LV E/e' medial:  10.8 LV IVS:        1.00 cm      LV e' lateral:   9.24 cm/s LVOT diam:     2.30 cm      LV E/e' lateral: 6.0 LV SV:         73 LV SV Index:   33 LVOT Area:     4.15 cm  LV Volumes (MOD) LV vol d, MOD A2C: 183.0 ml LV vol d, MOD A4C: 159.0 ml LV vol s, MOD A2C: 86.8 ml LV vol s, MOD A4C: 84.2 ml LV SV MOD A2C:     96.2 ml LV SV MOD A4C:     159.0 ml LV SV MOD BP:      83.4 ml RIGHT VENTRICLE RV S prime:     15.30 cm/s TAPSE (M-mode): 1.5 cm LEFT ATRIUM             Index       RIGHT ATRIUM           Index LA diam:        3.40  cm 1.52 cm/m  RA Area:     12.30 cm LA Vol (A2C):   39.2 ml 17.57 ml/m RA Volume:   26.40 ml  11.83 ml/m LA Vol (A4C):   37.7 ml 16.90 ml/m LA Biplane Vol: 42.5 ml 19.05 ml/m  AORTIC VALVE LVOT Vmax:   132.00 cm/s LVOT Vmean:  91.900 cm/s LVOT VTI:    0.175 m MITRAL VALVE MV Area (PHT): 4.49 cm    SHUNTS MV Decel Time: 169 msec    Systemic VTI:  0.18 m MV E velocity: 55.90 cm/s  Systemic Diam: 2.30 cm MV A velocity: 68.60 cm/s MV E/A ratio:  0.81 Oswaldo Milian MD Electronically signed by Oswaldo Milian MD Signature Date/Time: 01/31/2020/1:13:35 PM    Final      Time Spent in minutes  30     Desiree Hane M.D on 01/31/2020 at 8:47 PM  To page go to [email or web address] - password [facility]

## 2020-01-31 NOTE — Progress Notes (Signed)
  Echocardiogram 2D Echocardiogram has been performed.  Geoffery Lyons Sw[place] 01/31/2020, 12:00 PM

## 2020-01-31 NOTE — TOC Progression Note (Signed)
Transition of Care [facility]) - Progression Note    Patient Details  Name: Hector [name] MRN: [id number] Date of Birth: [birth year]  Transition of Care [facility]) CM/SW Contact  Ross Ludwig, [place] Phone Number: 01/31/2020, 8:22 PM  Clinical Narrative:     CSW was informed that patient is from [facility].     Expected Discharge Plan and Services           Expected Discharge Date:  (unknown)                                     Social Determinants of Health (SDOH) Interventions    Readmission Risk Interventions Readmission Risk Prevention Plan 01/02/2020  Transportation Screening Complete  PCP or Specialist Appt within 3-5 Days Complete  HRI or [place] Complete  Social Work Consult for [place] Planning/Counseling Complete  Palliative Care Screening Not Applicable  Medication Review [employer]) Complete  Some recent data might be hidden

## 2020-02-01 ENCOUNTER — Encounter (HOSPITAL_COMMUNITY): Payer: Self-pay | Admitting: Internal Medicine

## 2020-02-01 DIAGNOSIS — R7989 Other specified abnormal findings of blood chemistry: Secondary | ICD-10-CM

## 2020-02-01 LAB — GLUCOSE, CAPILLARY
Glucose-Capillary: 99 mg/dL (ref 70–99)
Glucose-Capillary: 93 mg/dL (ref 70–99)
Glucose-Capillary: 101 mg/dL — ABNORMAL HIGH (ref 70–99)
Glucose-Capillary: 86 mg/dL (ref 70–99)

## 2020-02-01 LAB — CBC
HCT: 40.6 % (ref 39.0–52.0)
Hemoglobin: 13.1 g/dL (ref 13.0–17.0)
MCH: 28.2 pg (ref 26.0–34.0)
MCHC: 32.3 g/dL (ref 30.0–36.0)
MCV: 87.5 fL (ref 80.0–100.0)
Platelets: 255 10*3/uL (ref 150–400)
RBC: 4.64 MIL/uL (ref 4.22–5.81)
RDW: 15.2 % (ref 11.5–15.5)
WBC: 9.6 10*3/uL (ref 4.0–10.5)
nRBC: 0 % (ref 0.0–0.2)

## 2020-02-01 LAB — BASIC METABOLIC PANEL
Anion gap: 12 (ref 5–15)
BUN: 58 mg/dL — ABNORMAL HIGH (ref 6–20)
CO2: 22 mmol/L (ref 22–32)
Calcium: 9.4 mg/dL (ref 8.9–10.3)
Chloride: 101 mmol/L (ref 98–111)
Creatinine, Ser: 2.21 mg/dL — ABNORMAL HIGH (ref 0.61–1.24)
GFR, Estimated: 34 mL/min — ABNORMAL LOW (ref >60)
Glucose, Bld: 97 mg/dL (ref 70–99)
Potassium: 4.3 mmol/L (ref 3.5–5.1)
Sodium: 135 mmol/L (ref 135–145)

## 2020-02-01 LAB — T4, FREE: Free T4: 4.85 ng/dL — ABNORMAL HIGH (ref 0.61–1.12)

## 2020-02-01 LAB — TSH: TSH: <0.01 u[IU]/mL — ABNORMAL LOW (ref 0.350–4.500)

## 2020-02-01 MED ORDER — FUROSEMIDE 40 MG PO TABS
40.0000 mg | ORAL_TABLET | Freq: Every day | ORAL | Status: DC
  Administered 2020-02-01 – 2020-02-04 (×4): 40 mg via ORAL
  Filled 2020-02-01 (×4): qty 1

## 2020-02-01 MED ORDER — CARVEDILOL 6.25 MG PO TABS
6.2500 mg | ORAL_TABLET | Freq: Two times a day (BID) | ORAL | Status: DC
  Administered 2020-02-01 – 2020-02-07 (×14): 6.25 mg via ORAL
  Filled 2020-02-01 (×14): qty 1

## 2020-02-01 MED ORDER — CARVEDILOL 6.25 MG PO TABS: 6.2500 mg | ORAL_TABLET | Freq: Two times a day (BID) | ORAL | Status: DC

## 2020-02-01 MED ORDER — MUSCLE RUB 10-15 % EX CREA
1.0000 "application " | TOPICAL_CREAM | CUTANEOUS | Status: DC | PRN
  Administered 2020-02-01: 1 via TOPICAL
  Filled 2020-02-01: qty 85

## 2020-02-01 NOTE — NC FL2 (Signed)
[place] LEVEL OF CARE SCREENING TOOL     IDENTIFICATION  Patient Name: Hector [name] Birthdate: [birth year] Sex: male Admission Date (Current Location): 01/30/2020  [facility] and [place] Number:  Herbalist and Address:  [facility],  [place] [address], [place]      Provider Number: [id number]  Attending Physician Name and Address:  Desiree Hane, MD  Relative Name and Phone Number:  Orren Pietsch mother [phone]    Current Level of Care: Hospital Recommended Level of Care: [place] Prior Approval Number:    Date Approved/Denied:   PASRR Number: [id number]  Discharge Plan: SNF    Current Diagnoses: Patient Active Problem List   Diagnosis Date Noted  . UTI (urinary tract infection) 01/30/2020  . Acute respiratory distress syndrome (ARDS) due to COVID-19 virus ([place]) 12/31/2019  . Acute renal failure superimposed on stage 3b chronic kidney disease ([place]) 12/31/2019  . Hyperkalemia 12/31/2019  . Hyponatremia 12/31/2019  . Morbidly obese ([place]) 12/29/2019  . Pneumonia due to COVID-19 virus 12/22/2019  . Acute respiratory failure with hypoxemia ([place]) 12/22/2019  . Severe sepsis ([place]) 12/22/2019  . COVID-19 12/22/2019  . Chronic systolic heart failure ([place]) 07/21/2016  . Obstructive sleep apnea 04/21/2016  . Nonischemic cardiomyopathy ([place]) 05/26/2015  . Chronic diastolic heart failure ([place]) 04/01/2015  . Essential hypertension 04/01/2015  . Sinus bradycardia 01/29/2015  . Paroxysmal atrial fibrillation ([place]) 08/16/2014  . Paroxysmal atrial flutter ([place]) 08/14/2014  . Chronic kidney disease (CKD), stage III (moderate) ([place]) 07/02/2014  . Hypertensive heart disease   . Hyperlipidemia     Orientation RESPIRATION BLADDER Height & Weight     Self,Time,Situation,Place  Normal Incontinent Weight: 102.7 kg Height:  5\' 11"  (180.3 cm)  BEHAVIORAL SYMPTOMS/MOOD NEUROLOGICAL BOWEL NUTRITION STATUS       Incontinent Diet (CHO MOD)  AMBULATORY STATUS COMMUNICATION OF NEEDS Skin   Limited Assist Verbally Normal                       Personal Care Assistance Level of Assistance  Bathing,Feeding,Dressing Bathing Assistance: Limited assistance Feeding assistance: Limited assistance Dressing Assistance: Limited assistance     Functional Limitations Info  Sight,Hearing,Speech Sight Info: Impaired (eyeglasses) Hearing Info: Adequate Speech Info: Adequate    SPECIAL CARE FACTORS FREQUENCY  PT (By licensed PT),OT (By licensed OT)     PT Frequency: 5x week OT Frequency: 5x week            Contractures Contractures Info: Not present    Additional Factors Info  Code Status,Allergies Code Status Info:  (Full code) Allergies Info: Esomeprazole Magnesium, Eggs Or Egg-derived Products           Current Medications (02/01/2020):  This is the current hospital active medication list Current Facility-Administered Medications  Medication Dose Route Frequency Provider Last Rate Last Admin  . acetaminophen (TYLENOL) tablet 650 mg  650 mg Oral Q6H PRN Marylyn Ishihara, Tyrone A, DO       Or  . acetaminophen (TYLENOL) suppository 650 mg  650 mg Rectal Q6H PRN Marylyn Ishihara, Tyrone A, DO      . amiodarone (PACERONE) tablet 200 mg  200 mg Oral Daily Kyle, Tyrone A, DO   200 mg at 02/01/20 0907  . amLODipine (NORVASC) tablet 10 mg  10 mg Oral Daily Kyle, Tyrone A, DO   10 mg at 02/01/20 0907  . apixaban (ELIQUIS) tablet 5 mg  5 mg Oral BID Marylyn Ishihara,  Tyrone A, DO   5 mg at 02/01/20 0906  . atorvastatin (LIPITOR) tablet 80 mg  80 mg Oral Daily Kyle, Tyrone A, DO   80 mg at 02/01/20 0906  . carvedilol (COREG) tablet 6.25 mg  6.25 mg Oral BID WC Oretha Milch D, MD   6.25 mg at 02/01/20 0906  . cefTRIAXone (ROCEPHIN) 1 g in sodium chloride 0.9 % 100 mL IVPB  1 g Intravenous Q24H Kyle, Tyrone A, DO 200 mL/hr at 02/01/20 0909 1 g at 02/01/20 0909  . furosemide (LASIX) tablet 40 mg  40 mg Oral Daily Oretha Milch D,  MD   40 mg at 02/01/20 1424  . hydrALAZINE (APRESOLINE) injection 5 mg  5 mg Intravenous Q6H PRN Oretha Milch D, MD   5 mg at 02/01/20 0031  . insulin aspart (novoLOG) injection 0-15 Units  0-15 Units Subcutaneous TID WC Kyle, Tyrone A, DO      . insulin aspart (novoLOG) injection 0-5 Units  0-5 Units Subcutaneous QHS Kyle, Tyrone A, DO      . Muscle Rub CREA 1 application  1 application Topical PRN Desiree Hane, MD   1 application at 09/38/18 2993     Discharge Medications: Please see discharge summary for a list of discharge medications.  Relevant Imaging Results:  Relevant Lab Results:   Additional Information SS#[id number] [place]  Mikeisha Lemonds, Juliann Pulse, [place]

## 2020-02-01 NOTE — Progress Notes (Signed)
[facility]  PROGRESS NOTE  TAMEL ABEL AOZ:[id number] DOB: 08/26/1965 DOA: 01/30/2020 PCP: [name], MD Admit date - 01/30/2020   Admitting Physician Jonnie Finner, DO  Outpatient Primary MD for the patient is [name], MD  LOS - 2 Brief Narrative   Hector [name] is a 54 y.o. year old male with medical history significant for atrial fibrillation on Eliquis, CHF, diabetes, CKD stage III who presented on 01/30/2020 with upper extremity tremors and was found to have UTI, acute on chronic diastolic CHF, and poorly controlled hypertension  Subjective  Today states his breathing is fine, denies any chest pain or palpitations  A & P   Acute on chronic diastolic CHF exacerbation, improving has history of combined CHF.  TTE.  Shows preserved EF with indeterminate diastolic function on admission evidence of vascular congestion on chest x-ray seems consistent with exacerbation.  Patient not expressing any shortness of breath but did report tremors prior to admission.  Likely nidus poorly controlled blood pressure given poorly controlled diastolic blood pressure.  Seemed to respond well to IV Lasix well and now no overt signs of volume overload on exam -Transition oral Lasix -Monitor volume status -Daily weights, strict I's and O's -Closely monitor blood pressure -Continue home Coreg, currently holding Entresto given presented with AKI  Hypertension, not at goal.  DBP remains greater than 110 -Continue amlodipine 10 mg, Coreg increased to 6.25 mg twice daily -Currently holding home Entresto on admission due to previous history of AKI and slight AKI on this admission -IV hydralazine as needed  Hypothyroidism with TSH suppressed, T4 elevated, some concern for hyperthyroidism given patient did present with tremors and elevated heart rate, though his elevated heart rate could also be seen in the setting of atrial fibrillation.  Patient also on amiodarone which  can induce hypothyroidism.  No appreciable goiter on exam -Discontinue Synthroid -Pending free T3, check antibodies -Consult cardiology to assist with concern for amiodarone induced hyperthyroidism -Continue to monitor on telemetry  Reported tremors.  Concern could be related to overt hyperthyroidism as expressed above not currently having tremors.  Ammonia within normal limits.  Initially suspected it could be  related to elevated BUN but this seems chronic in etiology and currently CKD is stable. EEG unremarkable -Discontinue lactulose --Continue to monitor  AKI on CKD, back to baseline.  Baseline creatinine prior to admission in November 2.2-2.4.  Creatinine of 2.9 on admission not related to CHF extubation and poorly controlled blood pressure. Presented with hyperkalemia that resolved with lactulose --Continue to hold home sacubitril/valsartan -Avoid further nephrotoxins -Monitor output -Monitor BMP  Suspected UTI based off UA -Monitor urine culture -Continue IV ceftriaxone  Type 2 diabetes, well controlled A1c 7.7 (11/2019) -Sliding scale insulin, monitor CBG  Atrial fibrillation, currently rate controlled -monitor on telemetry -Continue amiodarone and Eliquis  HLD, stable -continue statin    Family Communication  :  none  Code Status :  FULL  Disposition Plan  :  Patient is from home. Anticipated d/c date:  1-2 days. Barriers to d/c or necessity for inpatient status: monitoring BP, IV ceftriaxone for UTI, working up concern for hyperthyroidism, cardiology Consults  : Cardiology  Procedures  :  TTE, 01/31/20  DVT Prophylaxis  :  eliquis  MDM: The below labs and imaging reports were reviewed and summarized above.  Medication management as above.  Lab Results  Component Value Date   PLT 255 02/01/2020    Diet :  Diet Order  Diet heart healthy/carb modified Room service appropriate? No; Fluid consistency: Thin  Diet effective now                   Inpatient Medications Scheduled Meds: . amiodarone  200 mg Oral Daily  . amLODipine  10 mg Oral Daily  . apixaban  5 mg Oral BID  . atorvastatin  80 mg Oral Daily  . carvedilol  6.25 mg Oral BID WC  . insulin aspart  0-15 Units Subcutaneous TID WC  . insulin aspart  0-5 Units Subcutaneous QHS  . levothyroxine  100 mcg Oral QAC breakfast   Continuous Infusions: . cefTRIAXone (ROCEPHIN)  IV 1 g (02/01/20 0909)   PRN Meds:.acetaminophen **OR** acetaminophen, hydrALAZINE, Muscle Rub  Antibiotics  :   Anti-infectives (From admission, onward)   Start     Dose/Rate Route Frequency Ordered Stop   01/31/20 0800  cefTRIAXone (ROCEPHIN) 1 g in sodium chloride 0.9 % 100 mL IVPB        1 g 200 mL/hr over 30 Minutes Intravenous Every 24 hours 01/30/20 1517     01/30/20 0530  cefTRIAXone (ROCEPHIN) 1 g in sodium chloride 0.9 % 100 mL IVPB        1 g 200 mL/hr over 30 Minutes Intravenous  Once 01/30/20 0515 01/30/20 0811   01/30/20 0500  cefTRIAXone (ROCEPHIN) 1 g in sodium chloride 0.9 % 100 mL IVPB        1 g 200 mL/hr over 30 Minutes Intravenous  Once 01/30/20 0453 01/30/20 0621       Objective   Vitals:   02/01/20 0100 02/01/20 0510 02/01/20 0856 02/01/20 1140  BP: (!) 131/97 (!) 141/100 (!) 158/113 (!) 129/92  Pulse: (!) 102 (!) 101 (!) 104 98  Resp: (!) 22 (!) 23 19 (!) 25  Temp: 98.6 F (37 C) 99 F (37.2 C) 97.6 F (36.4 C) (!) 97.4 F (36.3 C)  TempSrc: Oral Oral    SpO2: 92% 92% 95% 94%  Weight:  102.7 kg    Height:        SpO2: 94 %  Wt Readings from Last 3 Encounters:  02/01/20 102.7 kg  01/23/20 124.3 kg  01/12/20 117.7 kg     Intake/Output Summary (Last 24 hours) at 02/01/2020 1412 Last data filed at 02/01/2020 0521 Gross per 24 hour  Intake -  Output 2150 ml  Net -2150 ml    Physical Exam:   Awake Alert, Oriented X 3, Normal affect No proptosis No new F.N deficits,  [place].AT, Normal respiratory effort on room air, lungs clear to auscultation  bilaterally Irregularly irregular, normal rate, no peripheral edema +ve B.Sounds, Abd Soft, No tenderness, No rebound, guarding or rigidity. No Cyanosis, No new Rash or bruise    I have personally reviewed the following:   Data Reviewed:  CBC Recent Labs  Lab 01/30/20 0210 01/30/20 0701 01/31/20 1002 02/01/20 0519  WBC 17.7*  --  10.6* 9.6  HGB 14.6 14.3 12.1* 13.1  HCT 47.3 42.0 37.8* 40.6  PLT 315  --  247 255  MCV 91.5  --  89.4 87.5  MCH 28.2  --  28.6 28.2  MCHC 30.9  --  32.0 32.3  RDW 15.3  --  15.3 15.2  LYMPHSABS 2.9  --   --   --   MONOABS 1.4*  --   --   --   EOSABS 0.1  --   --   --   BASOSABS 0.1  --   --   --  Chemistries  Recent Labs  Lab 01/30/20 0210 01/30/20 0701 01/30/20 1038 01/31/20 1002 02/01/20 0519  NA 136 136  --  134* 135  K 5.4* 5.7*  --  4.3 4.3  CL 104 106  --  103 101  CO2 14*  --   --  16* 22  GLUCOSE 127* 84  --  135* 97  BUN 63* 80*  --  60* 58*  CREATININE 2.79* 2.90*  --  2.51* 2.21*  CALCIUM 10.1  --   --  8.9 9.4  AST  --   --  40 34  --   ALT  --   --  45* 41  --   ALKPHOS  --   --  100 84  --   BILITOT  --   --  1.5* 1.2  --    ------------------------------------------------------------------------------------------------------------------ No results for input(s): CHOL, HDL, LDLCALC, TRIG, CHOLHDL, LDLDIRECT in the last 72 hours.  Lab Results  Component Value Date   HGBA1C 7.7 (H) 12/23/2019   ------------------------------------------------------------------------------------------------------------------ Recent Labs    02/01/20 0519  TSH <0.010*   ------------------------------------------------------------------------------------------------------------------ No results for input(s): VITAMINB12, FOLATE, FERRITIN, TIBC, IRON, RETICCTPCT in the last 72 hours.  Coagulation profile No results for input(s): INR, PROTIME in the last 168 hours.  No results for input(s): DDIMER in the last 72  hours.  Cardiac Enzymes No results for input(s): CKMB, TROPONINI, MYOGLOBIN in the last 168 hours.  Invalid input(s): CK ------------------------------------------------------------------------------------------------------------------    Component Value Date/Time   BNP 74.1 01/30/2020 1038    Micro Results Recent Results (from the past 240 hour(s))  Urine culture     Status: Abnormal   Collection Time: 01/30/20  3:26 AM   Specimen: Urine, Random  Result Value Ref Range Status   Specimen Description   Final    URINE, RANDOM Performed at [facility] [address], [place], [place] [address]    Special Requests   Final    NONE Performed at [facility], [place] [address] [place], [place] [address]    Culture MULTIPLE SPECIES PRESENT, SUGGEST RECOLLECTION (A)  Final   Report Status 01/31/2020 FINAL  Final  Blood culture (routine x 2)     Status: None (Preliminary result)   Collection Time: 01/30/20  4:45 AM   Specimen: BLOOD RIGHT HAND  Result Value Ref Range Status   Specimen Description   Final    BLOOD RIGHT HAND Performed at [facility] [address], [place], [place] [address]    Special Requests   Final    BOTTLES DRAWN AEROBIC AND ANAEROBIC Blood Culture results may not be optimal due to an inadequate volume of blood received in culture bottles Performed at [facility] [address], [place], [place] [address]    Culture   Final    NO GROWTH 2 DAYS Performed at [facility] [address], [place], [place] [address]    Report Status PENDING  Incomplete  Blood culture (routine x 2)     Status: None (Preliminary result)   Collection Time: 01/30/20 10:38 AM   Specimen: BLOOD  Result Value Ref Range Status   Specimen Description   Final    BLOOD BLOOD LEFT FOREARM Performed at [facility] [address], [place], [place] [address]    Special Requests    Final    BOTTLES DRAWN AEROBIC AND ANAEROBIC Blood Culture adequate volume Performed at [facility], [place] [address], [place], [place] [address]    Culture   Final    NO GROWTH 2 DAYS Performed at [facility] [address], [place], [place] [address]    Report Status PENDING  Incomplete    Radiology Reports DG Chest 2 View  Result Date: 01/30/2020 CLINICAL DATA:  Shortness of breath and elevated white blood cell count EXAM: CHEST - 2 VIEW COMPARISON:  12/28/2019 FINDINGS: Cardiomegaly and vascular pedicle widening with aortic tortuosity. There is improved aeration since prior. Congested appearance of vessels without focal pneumonia. No visible effusion or pneumothorax. IMPRESSION: Cardiomegaly and vascular congestion. Aeration is improved since comparison last month. Electronically Signed   By: Monte Fantasia M.D.   On: 01/30/2020 05:12   DG Hand 2 View Left  Result Date: 01/10/2020 CLINICAL DATA:  Left hand pain and swelling EXAM: LEFT HAND - 2 VIEW COMPARISON:  None. FINDINGS: There is no evidence of fracture or dislocation. There is no evidence of arthropathy or other focal bone abnormality. Soft tissues are unremarkable. IMPRESSION: Negative. Electronically Signed   By: Prudencio Pair M.D.   On: 01/10/2020 15:50   US Venous Img Upper Uni Left (DVT)  Result Date: 01/07/2020 CLINICAL DATA:  Pain left arm x2 weeks EXAM: LEFT UPPER EXTREMITY VENOUS DOPPLER ULTRASOUND TECHNIQUE: Gray-scale sonography with graded compression, as well as color Doppler and duplex ultrasound were performed to evaluate the upper extremity deep venous system from the level of the subclavian vein and including the jugular, axillary, basilic, radial, ulnar and upper cephalic vein. Spectral Doppler was utilized to evaluate flow at rest and with distal augmentation maneuvers. COMPARISON:  None. FINDINGS: Contralateral Subclavian Vein: Respiratory phasicity is normal and symmetric with the  symptomatic side. No evidence of thrombus. Normal compressibility. Internal Jugular Vein: No evidence of thrombus. Normal compressibility, respiratory phasicity and response to augmentation. Subclavian Vein: No evidence of thrombus. Normal compressibility, respiratory phasicity and response to augmentation. Axillary Vein: No evidence of thrombus. Normal compressibility, respiratory phasicity and response to augmentation. Cephalic Vein: No evidence of thrombus. Normal compressibility, respiratory phasicity and response to augmentation. Basilic Vein: No evidence of thrombus. Normal compressibility, respiratory phasicity and response to augmentation. Brachial Veins: No evidence of thrombus. Normal compressibility, respiratory phasicity and response to augmentation. Radial Veins: No evidence of thrombus. Normal compressibility, respiratory phasicity and response to augmentation. Ulnar Veins: No evidence of thrombus. Normal compressibility, respiratory phasicity and response to augmentation. Other Findings:  None visualized. IMPRESSION: No evidence of DVT within the left upper extremity. Electronically Signed   By: Constance Holster M.D.   On: 01/07/2020 18:44   EEG adult  Result Date: 01/30/2020 Lora Havens, MD     01/30/2020  8:55 PM Patient Name: LAYDON Hector [name] MRN: [id number] Epilepsy Attending: Lora Havens Referring Physician/Provider: Dr Cherylann Ratel Date: 01/30/2020 Duration: 24.26 mins Patient history: 54yo M with upper extremity shaking. EEG to evaluate for seizure Level of alertness: Awake,  Asleep AEDs during EEG study: None Technical aspects: This EEG study was done with scalp electrodes positioned according to the 10-20 International system of electrode placement. Electrical activity was acquired at a sampling rate of 500Hz  and reviewed with a high frequency filter of 70Hz  and a low frequency filter of 1Hz . EEG data were recorded continuously and digitally stored. Description: The posterior  dominant rhythm consists of 8 Hz activity of moderate voltage (25-35 uV) seen predominantly in posterior head regions, symmetric and reactive to eye opening and eye closing. Sleep  was characterized by vertex waves, sleep spindles (12 to 14 Hz), maximal frontocentral region. Hyperventilation and photic stimulation were not performed.   IMPRESSION: This study is within normal limits. No seizures or epileptiform discharges were seen throughout the recording. Lora Havens   ECHOCARDIOGRAM COMPLETE  Result Date: 01/31/2020    ECHOCARDIOGRAM REPORT   Patient Name:   ANUAR WALGREN Date of Exam: 01/31/2020 Medical Rec #:  [id number]           Height:       71.0 in Accession #:    [id number]          Weight:       228.4 lb Date of Birth:  [birth year]           BSA:          2.231 m Patient Age:    70 years            BP:           145/108 mmHg Patient Gender: M                   HR:           108 bpm. Exam Location:  Inpatient Procedure: 2D Echo, Cardiac Doppler, Color Doppler and Intracardiac            Opacification Agent Indications:    CHF-Acute Diastolic 025.42 / H06.23  History:        Patient has prior history of Echocardiogram examinations, most                 recent 12/03/2016. Cardiomyopathy, Arrythmias:Atrial                 Fibrillation and Atrial Flutter; Risk Factors:Hypertension,                 Dyslipidemia and Former Smoker. Elevated troponin.  Sonographer:    Vickie Epley RDCS Referring Phys: [id number] [facility] D Carnetta Losada  Sonographer Comments: Image acquisition challenging due to respiratory motion. IMPRESSIONS  1. Left ventricular ejection fraction, by estimation, is 45 to 50%. The left ventricle has normal function. The left ventricle demonstrates regional wall motion abnormalities. Abnormal (paradoxical) septal motion, consistent with left bundle branch block. Left ventricular diastolic parameters are indeterminate.  2. Right ventricular systolic function is normal. The right ventricular size is  normal. Tricuspid regurgitation signal is inadequate for assessing PA pressure.  3. The mitral valve is normal in structure. No evidence of mitral valve regurgitation.  4. The aortic valve was not well visualized. Aortic valve regurgitation is not visualized. No aortic stenosis is present.  5. The inferior vena cava is normal in size with greater than 50% respiratory variability, suggesting right atrial pressure of 3 mmHg. FINDINGS  Left Ventricle: Left ventricular ejection fraction, by estimation, is 45 to 50%. The left ventricle has normal function. The left ventricle demonstrates regional wall motion abnormalities. Definity contrast agent was given IV to delineate the left ventricular endocardial borders. The left ventricular internal cavity size was normal in size. There is no left ventricular hypertrophy. Abnormal (paradoxical) septal motion, consistent with left bundle branch block. Left ventricular diastolic parameters  are indeterminate. Right Ventricle: The right ventricular size is normal. Right vetricular wall thickness was not assessed. Right ventricular systolic function is normal. Tricuspid regurgitation signal is inadequate for assessing PA pressure. Left Atrium: Left atrial size was normal in size. Right Atrium: Right atrial size was normal in size. Pericardium: There is no  evidence of pericardial effusion. Mitral Valve: The mitral valve is normal in structure. No evidence of mitral valve regurgitation. Tricuspid Valve: The tricuspid valve is normal in structure. Tricuspid valve regurgitation is not demonstrated. Aortic Valve: The aortic valve was not well visualized. Aortic valve regurgitation is not visualized. No aortic stenosis is present. Pulmonic Valve: The pulmonic valve was not well visualized. Pulmonic valve regurgitation is not visualized. Aorta: The aortic root is normal in size and structure. Venous: The inferior vena cava is normal in size with greater than 50% respiratory variability,  suggesting right atrial pressure of 3 mmHg. IAS/Shunts: The interatrial septum was not well visualized.  LEFT VENTRICLE PLAX 2D LVIDd:         4.60 cm      Diastology LVIDs:         3.60 cm      LV e' medial:    5.19 cm/s LV PW:         1.00 cm      LV E/e' medial:  10.8 LV IVS:        1.00 cm      LV e' lateral:   9.24 cm/s LVOT diam:     2.30 cm      LV E/e' lateral: 6.0 LV SV:         73 LV SV Index:   33 LVOT Area:     4.15 cm  LV Volumes (MOD) LV vol d, MOD A2C: 183.0 ml LV vol d, MOD A4C: 159.0 ml LV vol s, MOD A2C: 86.8 ml LV vol s, MOD A4C: 84.2 ml LV SV MOD A2C:     96.2 ml LV SV MOD A4C:     159.0 ml LV SV MOD BP:      83.4 ml RIGHT VENTRICLE RV S prime:     15.30 cm/s TAPSE (M-mode): 1.5 cm LEFT ATRIUM             Index       RIGHT ATRIUM           Index LA diam:        3.40 cm 1.52 cm/m  RA Area:     12.30 cm LA Vol (A2C):   39.2 ml 17.57 ml/m RA Volume:   26.40 ml  11.83 ml/m LA Vol (A4C):   37.7 ml 16.90 ml/m LA Biplane Vol: 42.5 ml 19.05 ml/m  AORTIC VALVE LVOT Vmax:   132.00 cm/s LVOT Vmean:  91.900 cm/s LVOT VTI:    0.175 m MITRAL VALVE MV Area (PHT): 4.49 cm    SHUNTS MV Decel Time: 169 msec    Systemic VTI:  0.18 m MV E velocity: 55.90 cm/s  Systemic Diam: 2.30 cm MV A velocity: 68.60 cm/s MV E/A ratio:  0.81 Oswaldo Milian MD Electronically signed by Oswaldo Milian MD Signature Date/Time: 01/31/2020/1:13:35 PM    Final      Time Spent in minutes  30     Desiree Hane M.D on 02/01/2020 at 2:12 PM  To page go to [email or web address] - password [email or web address]

## 2020-02-01 NOTE — Evaluation (Signed)
Physical Therapy Evaluation Patient Details Name: Hector [name] MRN: [id number] DOB: September 01, 1965 Today's Date: 02/01/2020   History of Present Illness  Hector [name] is a 54 y.o. year old male with medical history significant for atrial fibrillation on Eliquis, CHF, diabetes, CKD stage III who presented on 01/30/2020 with upper extremity tremors and was found to have UTI, acute on chronic diastolic CHF, and poorly controlled hypertension  Clinical Impression  The patient initially did not want to mobilize, stating that he  Had been asleep. Patient  Did participate in bed mobility, requiring encouragement to self assist at any point. Patient relied on therapist to  Pull to sitting upright position.. Patient sat with trunk control x 5 minutes. Patient assisted back into bed with patient being able to place both legs onto bed and min assistance for trunk. Patient assisted with sliding up in bed, instructing therapist to place [facility] down and tilt head down. Patient held onto head of bed to assist with sliding up.  Patient will benefit from continued PT post acute. Pt admitted with above diagnosis.   Pt currently with functional limitations due to the deficits listed below (see PT Problem List). Pt will benefit from skilled PT to increase their independence and safety with mobility to allow discharge to the venue listed below.     Follow Up Recommendations SNF    Equipment Recommendations  None recommended by PT    Recommendations for Other Services       Precautions / Restrictions Precautions Precautions: Fall      Mobility  Bed Mobility Overal bed mobility: Needs Assistance Bed Mobility: Supine to Sit     Supine to sit: Min assist Sit to supine: Min assist   General bed mobility comments: patient  moved legs to bed edge, asking for 1 HHA to pull up to sitting.  Patient sitting on edge with LLE ER and flexed, did not turn enough to place foot to floor. To retrun to supine,  patient asking for a HHA to lower trunk.    Transfers                    Ambulation/Gait                Stairs            Wheelchair Mobility    Modified Rankin (Stroke Patients Only)       Balance Overall balance assessment: Needs assistance Sitting-balance support: No upper extremity supported;Feet supported Sitting balance-Leahy Scale: Fair         Standing balance comment: NT                             Pertinent Vitals/Pain Faces Pain Scale: Hurts little more Pain Location: back Pain Descriptors / Indicators: Grimacing;Guarding Pain Intervention(s): Monitored during session;Limited activity within patient's tolerance;Repositioned    Home Living Family/patient expects to be discharged to:: Skilled nursing facility                 Additional Comments: has been  at [facility]  for 3 weeks    Prior Function Level of Independence: Needs assistance   Gait / Transfers Assistance Needed: non ambulatory, per patient, requires 2 persons to transfer to [facility]  ADL's / Homemaking Assistance Needed: assisted by SNF staff,        Hand Dominance        Extremity/Trunk Assessment   Upper Extremity Assessment LUE  Deficits / Details: limited control of the  hand, tends to stay flexed. raises to shoulder level.    Lower Extremity Assessment Lower Extremity Assessment: LLE deficits/detail LLE Deficits / Details: increased flexion tone/hyper responses to touch, patient able to extnd  the leg.    Cervical / Trunk Assessment Cervical / Trunk Assessment: Normal;Other exceptions Cervical / Trunk Exceptions: somewhat rotated trunk  Communication   Communication: No difficulties  Cognition Arousal/Alertness: Awake/alert Behavior During Therapy: Agitated;WFL for tasks assessed/performed Overall Cognitive Status: Within Functional Limits for tasks assessed                                 General Comments: quick to  state" I can't walk"      General Comments      Exercises     Assessment/Plan    PT Assessment Patient needs continued PT services  PT Problem List Decreased strength;Decreased range of motion;Decreased activity tolerance;Decreased balance;Decreased mobility;Decreased coordination;Decreased knowledge of use of DME;Decreased safety awareness;Decreased knowledge of precautions;Cardiopulmonary status limiting activity;Impaired sensation;Impaired tone       PT Treatment Interventions DME instruction;Functional mobility training;Therapeutic activities;Therapeutic exercise;Balance training;Patient/family education;Neuromuscular re-education;Wheelchair mobility training    PT Goals (Current goals can be found in the Care Plan section)  Acute Rehab PT Goals Patient Stated Goal: I am going back to [employer]. PT Goal Formulation: With patient Time For Goal Achievement: 02/15/20 Potential to Achieve Goals: Fair    Frequency Min 2X/week   Barriers to discharge        Co-evaluation               AM-PAC PT "6 Clicks" Mobility  Outcome Measure Help needed turning from your back to your side while in a flat bed without using bedrails?: A Lot Help needed moving from lying on your back to sitting on the side of a flat bed without using bedrails?: A Lot Help needed moving to and from a bed to a chair (including a wheelchair)?: Total Help needed standing up from a chair using your arms (e.g., wheelchair or bedside chair)?: Total Help needed to walk in hospital room?: Total Help needed climbing 3-5 steps with a railing? : Total 6 Click Score: 8    End of Session   Activity Tolerance: Patient tolerated treatment well Patient left: in bed;with call bell/phone within reach;with bed alarm set Nurse Communication: Mobility status      Time: 2836-6294 PT Time Calculation (min) (ACUTE ONLY): 18 min   Charges:   PT Evaluation $PT Eval Low Complexity: [place] Pager [phone] Office [phone]   Claretha Cooper 02/01/2020, 2:28 PM

## 2020-02-01 NOTE — Progress Notes (Signed)
Patient shared that he was having  discomfort in his back at 2355 on 01/31/2020, but refused repositioning and PRN pain medication. I educated the patient and offered interventions again, he refused. Upon reassessment patient was sleeping.

## 2020-02-01 NOTE — TOC Initial Note (Addendum)
Transition of Care [facility]) - Initial/Assessment Note    Patient Details  Name: Hector [name] MRN: [id number] Date of Birth: March 29, 1965  [facility] CM/SW Contact:    Dessa Phi, RN Phone Number: 02/01/2020, 12:14 PM  Clinical Narrative:   From [name] to return. PT to see prior facility getting auth for SNF.Will need covid prior d/c. 3p-Faxed fl2 to [facility] will start auth-will await auth prior ordering covid.                Expected Discharge Plan: Skilled Nursing Facility Barriers to Discharge: Continued Medical Work up   Patient Goals and CMS Choice Patient states their goals for this hospitalization and ongoing recovery are:: Return back to [place]   Choice offered to / list presented to : Patient  Expected Discharge Plan and Services Expected Discharge Plan: [place]   Discharge Planning Services: CM Consult Post Acute Care Choice: [employer] Living arrangements for the past 2 months: [place] Expected Discharge Date:  (unknown)                                    Prior Living Arrangements/Services Living arrangements for the past 2 months: [place] Lives with:: Facility Resident Patient language and need for interpreter reviewed:: Yes Do you feel safe going back to the place where you live?: Yes      Need for Family Participation in Patient Care: No (Comment) Care giver support system in place?: Yes (comment)   Criminal Activity/Legal Involvement Pertinent to Current Situation/Hospitalization: No - Comment as needed  Activities of Daily Living Home Assistive Devices/Equipment: Blood pressure cuff,Hospital bed,Grab bars around toilet,Grab bars in shower,Hand-held shower hose,Scales,Walker (specify type),Cane (specify quad or straight) (At home patient has a single point cane-At  [place] patient has necessary equipment) ADL Screening (condition at time of  admission) Patient's cognitive ability adequate to safely complete daily activities?: Yes Is the patient deaf or have difficulty hearing?: No Does the patient have difficulty seeing, even when wearing glasses/contacts?: No Does the patient have difficulty concentrating, remembering, or making decisions?: Yes Patient able to express need for assistance with ADLs?: Yes Does the patient have difficulty dressing or bathing?: Yes Independently performs ADLs?: No Communication: Independent Dressing (OT): Needs assistance Is this a change from baseline?: Pre-admission baseline Grooming: Needs assistance Is this a change from baseline?: Pre-admission baseline Feeding: Needs assistance Is this a change from baseline?: Pre-admission baseline Bathing: Needs assistance Is this a change from baseline?: Pre-admission baseline Toileting: Needs assistance Is this a change from baseline?: Pre-admission baseline In/Out Bed: Needs assistance Is this a change from baseline?: Pre-admission baseline Walks in Home: Needs assistance Is this a change from baseline?: Pre-admission baseline Does the patient have difficulty walking or climbing stairs?: Yes (secondary to weakness) Weakness of Legs: Both Weakness of Arms/Hands: None  Permission Sought/Granted Permission sought to share information with : Case Manager Permission granted to share information with : Yes, Verbal Permission Granted  Share Information with NAME: Case Manager           Emotional Assessment Appearance:: Appears stated age Attitude/Demeanor/Rapport: Gracious Affect (typically observed): Accepting Orientation: : Oriented to Self,Oriented to Place,Oriented to  Time,Oriented to Situation Alcohol / Substance Use: Alcohol Use Psych Involvement: No (comment)  Admission diagnosis:  Hyperkalemia [E87.5] UTI (urinary tract infection) [N39.0] Uremia [N19] Acute cystitis without hematuria [N30.00] AKI (acute kidney injury) ([place])  [  N17.9] Patient Active Problem List   Diagnosis Date Noted  . UTI (urinary tract infection) 01/30/2020  . Acute respiratory distress syndrome (ARDS) due to COVID-19 virus ([place]) 12/31/2019  . Acute renal failure superimposed on stage 3b chronic kidney disease ([place]) 12/31/2019  . Hyperkalemia 12/31/2019  . Hyponatremia 12/31/2019  . Morbidly obese ([place]) 12/29/2019  . Pneumonia due to COVID-19 virus 12/22/2019    Class: Acute  . Acute respiratory failure with hypoxemia ([place]) 12/22/2019    Class: Acute  . Severe sepsis ([place]) 12/22/2019    Class: Acute  . COVID-19 12/22/2019  . Chronic systolic heart failure ([place]) 07/21/2016  . Obstructive sleep apnea 04/21/2016  . Nonischemic cardiomyopathy ([place]) 05/26/2015  . Chronic diastolic heart failure ([place]) 04/01/2015  . Essential hypertension 04/01/2015  . Sinus bradycardia 01/29/2015  . Paroxysmal atrial fibrillation ([place]) 08/16/2014  . Paroxysmal atrial flutter ([place]) 08/14/2014    Class: Acute  . Chronic kidney disease (CKD), stage III (moderate) ([place]) 07/02/2014  . Hypertensive heart disease   . Hyperlipidemia    PCP:  [name], MD Pharmacy:   [facility], [place] [address] [id number] Phone: [phone] Fax: [phone]     Social Determinants of Health (SDOH) Interventions    Readmission Risk Interventions Readmission Risk Prevention Plan 01/02/2020  Transportation Screening Complete  PCP or Specialist Appt within 3-5 Days Complete  HRI or [place] Complete  Social Work Consult for [place] Planning/Counseling Complete  Palliative Care Screening Not Applicable  Medication Review [employer]) Complete  Some recent data might be hidden

## 2020-02-02 DIAGNOSIS — I5032 Chronic diastolic (congestive) heart failure: Secondary | ICD-10-CM

## 2020-02-02 DIAGNOSIS — M79642 Pain in left hand: Secondary | ICD-10-CM

## 2020-02-02 DIAGNOSIS — I10 Essential (primary) hypertension: Secondary | ICD-10-CM

## 2020-02-02 LAB — T3, FREE: T3, Free: 16 pg/mL — ABNORMAL HIGH (ref 2.0–4.4)

## 2020-02-02 LAB — BASIC METABOLIC PANEL
Anion gap: 15 (ref 5–15)
BUN: 64 mg/dL — ABNORMAL HIGH (ref 6–20)
CO2: 18 mmol/L — ABNORMAL LOW (ref 22–32)
Calcium: 9.3 mg/dL (ref 8.9–10.3)
Chloride: 100 mmol/L (ref 98–111)
Creatinine, Ser: 2.32 mg/dL — ABNORMAL HIGH (ref 0.61–1.24)
GFR, Estimated: 32 mL/min — ABNORMAL LOW (ref >60)
Glucose, Bld: 88 mg/dL (ref 70–99)
Potassium: 5.1 mmol/L (ref 3.5–5.1)
Sodium: 133 mmol/L — ABNORMAL LOW (ref 135–145)

## 2020-02-02 LAB — GLUCOSE, CAPILLARY
Glucose-Capillary: 75 mg/dL (ref 70–99)
Glucose-Capillary: 90 mg/dL (ref 70–99)
Glucose-Capillary: 107 mg/dL — ABNORMAL HIGH (ref 70–99)
Glucose-Capillary: 113 mg/dL — ABNORMAL HIGH (ref 70–99)

## 2020-02-02 LAB — SARS CORONAVIRUS 2 BY RT PCR (HOSPITAL ORDER, PERFORMED IN ~~LOC~~ HOSPITAL LAB): SARS Coronavirus 2: NEGATIVE

## 2020-02-02 MED ORDER — GABAPENTIN 100 MG PO CAPS
100.0000 mg | ORAL_CAPSULE | Freq: Three times a day (TID) | ORAL | Status: DC
  Administered 2020-02-02 – 2020-02-07 (×17): 100 mg via ORAL
  Filled 2020-02-02 (×17): qty 1

## 2020-02-02 NOTE — Consult Note (Signed)
Cardiology Consultation:   [name] MRN: [id number]; DOB: December 09, 1965  Admit date: 01/30/2020 Date of Consult: 02/02/2020  Primary Care Provider: [name], MD [facility] Cardiologist: Ida Rogue, MD  [place] Electrophysiologist:  None    Patient Profile:   Hector [name] is a 54 y.o. male with a PMH of chronic combined CHF/NICM likely 2/2 ETOH abuse, HTN, HLD, paroxysmal atrial fibrillation, DM type 2, hypothyroidism, OSA on CPAP, CKD stage 3b, and COVID-19 infection requiring hospitalization 11/2019, who is being seen today for the evaluation of CHF and ?amiodarone thyroid toxicity at the request of Dr. Lonny Prude.  History of Present Illness:   Hector [name] has been residing at [facility] following a lengthy hospitalization for COVID-19 PNA from 12/22/19-01/19/20. He was in his usual state of health until 01/29/20 when he noticed bilateral upper extremity shaking that persisted throughout the day. Aside from chronic SOB he had no other complaints though was noted to be a poor historian.   He was last evaluated by cardiology at an outpatient visit with Darylene Price, FNP with the heart failure clinic at [facility]. He had complaints of moderate DOE. Prior to this he had a lengthy hospitalization from 12/22/19-01/19/20 for COVID-19 PNA. His hospital course was complicated by AKI for which nephrology followed and entresto held at discharge. He was felt to be stable from a cardiac standpoint, therefore no medication changes occurred. His last ischemic evaluation was a LHC in 2015 which showed minimal CAD in dLCx/branch vessels.  At the time of this evaluation he feels almost back to baseline. Having some back pain today which is his only complaint. He has ongoing weakness following his recent hospitalization, though this is improving and he reports he was able to stand today. He reported uncontrollable shaking of his torso and arms prior to  admission. He has chronic DOE following his COVID-19 infection though this is unchanged in recent days. No complaints of chest pain, SOB at rest, palpitations, LE edema, orthopnea, PND, dizziness, lightheadedness, or syncope. Historically he is unaware of his atrial fibrillation. He notes he has been on amiodarone for years and does not recall if his thyroid dysfunction occurred before or after initiation of amiodarone.   Hospital course: intermittently tachycardic to the 100s, intermittently tachypneic, hypertensive (max 158/113), otherwise VSS. Labs on admission notable for K 5.7, Cr 2.9, Hgb 14.3, PLT 315, WBC 17.7, BNP 74. He was given lokelma with improvement in K. Utox negative. UA with large leuks, 1+ protein, and many bacteria, though UCx with multiple species. He was admitted to medicine for UTI. Leukocytosis improved with initiation of IV antibiotics. He underwent EEG to evaluate asterixis which was negative for seizure activity. TSH found to be low with elevated FT3/FT4, for which levothyroxine was discontinued. Echo this admission with EF 45-50%, indeterminate LV diastolic function, RWMA c/w LBBB, and no significant valvular abnormalities. He was diuresed with IV>po lasix given vascular congestion on CXR. UOP is net -1L in the past 24 hours and -2.6L this admission, however weight is up to 231lbs today from 226lbs yesterday. Carvedilol was increased for improved BP control. Cardiology asked to evaluate for possible amiodarone thyroid toxicity    Past Medical History:  Diagnosis Date  . Alcohol abuse   . 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 12/2007 MV: EF 28%, no isch;  b. 8/12 Echo: EF 25-35%; c. 02/2014 Echo: EF 20-25%; d. 12/2014 Cath: minimal CAD; e. 01/2015 Echo: EF 50-55%;  d. 05/2016 Echo: EF  30-35%, diff HK, gr2 DD; e. 11/2016 Echo: EF 45-50%, diff HK.  . Chronic combined systolic (congestive) and diastolic (congestive) heart failure ([place])    a. 05/2016 Most recent Echo: EF 30-35%,  diff HK, Gr2 DD, mild MR, mod dil LA; b. 11/2016 Echo: EF 45-50%, diff HK.  . CKD (chronic kidney disease), stage III ([place])   . Elevated troponin    Chronically elevated. - level was 0.17-0.10 during recent admission  . Essential hypertension   . GI bleed 11/2013  . Hyperlipidemia   . Pancreatitis   . Paroxysmal A-fib ([place])    a. new onset s/p unsuccessful TEE/DCCV on 08/16/2014; b. on amio/eliquis (CHA2DS2VASc = 2-3); c. reports 1-2 hrs of afib ~ q39mos.  . Paroxysmal atrial flutter ([place])    a. new onset 07/2014; b. s/p unsuccessful TEE/DCCV 08/16/2014; c. on apixaban.  . Sleep-disordered breathing    Has yet to have a sleep study    Past Surgical History:  Procedure Laterality Date  . CARDIAC CATHETERIZATION N/A 01/09/2015   Procedure: Left Heart Cath and Coronary Angiography;  Surgeon: [name], MD;  Location: [place];  Service: Cardiovascular;  Laterality: N/A;  . ELECTROPHYSIOLOGIC STUDY N/A 08/16/2014   Procedure: CARDIOVERSION;  Surgeon: [name], MD;  Location: [facility] ORS;  Service: Cardiovascular;  Laterality: N/A;  . FLEXIBLE SIGMOIDOSCOPY N/A 10/10/2015   Procedure: FLEXIBLE SIGMOIDOSCOPY;  Surgeon: [name], MD;  Location: [facility] ENDOSCOPY;  Service: Endoscopy;  Laterality: N/A;  . KNEE SURGERY Right   . NM MYOVIEW LTD  November 2011   No ischemia or infarction. EF 50-55% ( no improvement from 2009 Myoview EF of 28%  . TEE WITHOUT CARDIOVERSION N/A 08/16/2014   Procedure: TRANSESOPHAGEAL ECHOCARDIOGRAM (TEE);  Surgeon: [name], MD;  Location: [facility] ORS;  Service: Cardiovascular;  Laterality: N/A;     Home Medications:  Prior to Admission medications   Medication Sig Start Date End Date Taking? Authorizing Provider  allopurinol (ZYLOPRIM) 100 MG tablet Take 100 mg by mouth daily as needed.   Yes [provider]  amiodarone (PACERONE) 200 MG tablet Take 1 tablet (200 mg total) by mouth daily. 01/03/18  Yes Hackney, Otila Kluver A, FNP   amLODipine (NORVASC) 10 MG tablet Take 1 tablet (10 mg total) by mouth daily. 01/20/20  Yes Enzo Bi, MD  apixaban (ELIQUIS) 5 MG TABS tablet Take 1 tablet (5 mg total) by mouth 2 (two) times daily. 12/01/18  Yes Hackney, Otila Kluver A, FNP  atorvastatin (LIPITOR) 80 MG tablet Take 1 tablet (80 mg total) by mouth daily. 03/15/18  Yes Alisa Graff, FNP  carvedilol (COREG) 3.125 MG tablet Take 1 tablet (3.125 mg total) by mouth 2 (two) times daily with a meal. 03/15/18  Yes Darylene Price A, FNP  diclofenac Sodium (VOLTAREN) 1 % GEL Apply 2 g topically 2 (two) times daily. To left elbow, left forearm. 01/19/20  Yes Enzo Bi, MD  famotidine (PEPCID) 20 MG tablet Take 20 mg by mouth daily.   Yes [provider]  furosemide (LASIX) 40 MG tablet Take 1 tablet (40 mg total) by mouth daily as needed for fluid or edema. Per nephrology recommendation. 01/03/20  Yes Enzo Bi, MD  levothyroxine (SYNTHROID, LEVOTHROID) 100 MCG tablet Take 100 mcg by mouth daily before breakfast.   Yes [provider]  Menthol, Topical Analgesic, (BIOFREEZE ROLL-ON COLORLESS) 4 % GEL Apply 1 application topically daily.   Yes [provider]  Multiple Vitamin (MULTIVITAMIN WITH MINERALS)  TABS tablet Take 1 tablet by mouth daily. 01/20/20  Yes Enzo Bi, MD  Nutritional Supplements (FEEDING SUPPLEMENT, NEPRO CARB STEADY,) LIQD Take 237 mLs by mouth 3 (three) times daily between meals. 01/19/20  Yes Enzo Bi, MD  sacubitril-valsartan (ENTRESTO) 97-103 MG Hold until outpatient followup with cardiology due to acute kidney injury. Patient not taking: Reported on 01/30/2020 01/03/20   Enzo Bi, MD    Inpatient Medications: Scheduled Meds: . amiodarone  200 mg Oral Daily  . amLODipine  10 mg Oral Daily  . apixaban  5 mg Oral BID  . atorvastatin  80 mg Oral Daily  . carvedilol  6.25 mg Oral BID WC  . furosemide  40 mg Oral Daily  . insulin aspart  0-15 Units Subcutaneous TID WC  . insulin aspart  0-5 Units  Subcutaneous QHS   Continuous Infusions: . cefTRIAXone (ROCEPHIN)  IV 1 g (02/02/20 0815)   PRN Meds: acetaminophen **OR** acetaminophen, hydrALAZINE, Muscle Rub  Allergies:    Allergies  Allergen Reactions  . Esomeprazole Magnesium Other (See Comments)    Suspected interstitial nephritis 2018  . Eggs Or Egg-Derived Products Rash    Social History:   Social History   Socioeconomic History  . Marital status: Single    Spouse name: Not on file  . Number of children: Not on file  . Years of education: Not on file  . Highest education level: Not on file  Occupational History  . Not on file  Tobacco Use  . Smoking status: Former Smoker    Packs/day: 1.00    Years: 12.00    Pack years: 12.00    Quit date: 02/23/1998    Years since quitting: 21.9  . Smokeless tobacco: Never Used  Vaping Use  . Vaping Use: Never used  Substance and Sexual Activity  . Alcohol use: No    Alcohol/week: 0.0 standard drinks    Comment: 4 heavy alcohol drinker. Quit 2 years ago.  . Drug use: No  . Sexual activity: Not Currently  Other Topics Concern  . Not on file  Social History Narrative  . Not on file   Social Determinants of Health   Financial Resource Strain: Not on file  Food Insecurity: Not on file  Transportation Needs: Not on file  Physical Activity: Not on file  Stress: Not on file  Social Connections: Not on file  Intimate Partner Violence: Not on file    Family History:    Family History  Problem Relation Age of Onset  . Hypertension Mother   . Hyperlipidemia Mother   . Diabetes Mother      ROS:  Please see the history of present illness.   All other ROS reviewed and negative.     Physical Exam/Data:   Vitals:   02/01/20 1140 02/01/20 2124 02/02/20 0528 02/02/20 0529  BP: (!) 129/92 (!) 138/95 (!) 156/104   Pulse: 98 98 (!) 101   Resp: (!) 25 20 18    Temp: (!) 97.4 F (36.3 C) (!) 97.4 F (36.3 C) 98.2 F (36.8 C)   TempSrc:  Oral Oral   SpO2: 94% 91%  93%   Weight:    105.2 kg  Height:        Intake/Output Summary (Last 24 hours) at 02/02/2020 0919 Last data filed at 02/02/2020 0800 Gross per 24 hour  Intake 420 ml  Output 1450 ml  Net -1030 ml   Last 3 Weights 02/02/2020 02/01/2020 01/31/2020  Weight (lbs) 231 lb 14.8 oz  226 lb 8 oz 228 lb 6.3 oz  Weight (kg) 105.2 kg 102.74 kg 103.6 kg     Body mass index is 32.35 kg/m.  General:  Chronically ill appearing gentleman laying in bed in NAD HEENT: sclera anicteric Endocrine:  No thryomegaly Vascular: No carotid bruits; distal pulses 2+ bilaterally  Cardiac:  normal S1, S2; RRR; no murmurs, rubs, or gallops Lungs:  clear to auscultation bilaterally, no wheezing, rhonchi or rales  Abd: soft, nontender, no hepatomegaly  Ext: no edema Musculoskeletal:  No deformities, BUE and BLE strength normal and equal Skin: warm and dry  Neuro:  CNs 2-12 intact, no focal abnormalities noted Psych:  Normal affect   EKG:  The EKG was personally reviewed and demonstrates:  Sinus tachycardia with 1st degree AV block, rate 111 bpm, LBBB (non-specific IVCD seen on previous EKGs).  Telemetry:  Telemetry was personally reviewed and demonstrates:  Sinus rhythm/sinus tachycardia to 100s. Occasional PVCs  Relevant CV Studies: Echocardiogram 01/31/20: 1. Left ventricular ejection fraction, by estimation, is 45 to 50%. The  left ventricle has normal function. The left ventricle demonstrates  regional wall motion abnormalities. Abnormal (paradoxical) septal motion,  consistent with left bundle branch  block. Left ventricular diastolic parameters are indeterminate.  2. Right ventricular systolic function is normal. The right ventricular  size is normal. Tricuspid regurgitation signal is inadequate for assessing  PA pressure.  3. The mitral valve is normal in structure. No evidence of mitral valve  regurgitation.  4. The aortic valve was not well visualized. Aortic valve regurgitation  is not  visualized. No aortic stenosis is present.  5. The inferior vena cava is normal in size with greater than 50%  respiratory variability, suggesting right atrial pressure of 3 mmHg.   Laboratory Data:  High Sensitivity Troponin:  No results for input(s): TROPONINIHS in the last 720 hours.   Chemistry Recent Labs  Lab 01/31/20 1002 02/01/20 0519 02/02/20 0727  NA 134* 135 133*  K 4.3 4.3 5.1  CL 103 101 100  CO2 16* 22 18*  GLUCOSE 135* 97 88  BUN 60* 58* 64*  CREATININE 2.51* 2.21* 2.32*  CALCIUM 8.9 9.4 9.3  GFRNONAA 29* 34* 32*  ANIONGAP 15 12 15     Recent Labs  Lab 01/30/20 1038 01/31/20 1002  PROT 7.1 6.3*  ALBUMIN 3.4* 3.1*  AST 40 34  ALT 45* 41  ALKPHOS 100 84  BILITOT 1.5* 1.2   Hematology Recent Labs  Lab 01/30/20 0210 01/30/20 0701 01/31/20 1002 02/01/20 0519  WBC 17.7*  --  10.6* 9.6  RBC 5.17  --  4.23 4.64  HGB 14.6 14.3 12.1* 13.1  HCT 47.3 42.0 37.8* 40.6  MCV 91.5  --  89.4 87.5  MCH 28.2  --  28.6 28.2  MCHC 30.9  --  32.0 32.3  RDW 15.3  --  15.3 15.2  PLT 315  --  247 255   BNP Recent Labs  Lab 01/30/20 1038  BNP 74.1    DDimer No results for input(s): DDIMER in the last 168 hours.   Radiology/Studies:  DG Chest 2 View  Result Date: 01/30/2020 CLINICAL DATA:  Shortness of breath and elevated white blood cell count EXAM: CHEST - 2 VIEW COMPARISON:  12/28/2019 FINDINGS: Cardiomegaly and vascular pedicle widening with aortic tortuosity. There is improved aeration since prior. Congested appearance of vessels without focal pneumonia. No visible effusion or pneumothorax. IMPRESSION: Cardiomegaly and vascular congestion. Aeration is improved since comparison last month. Electronically Signed  By: Monte Fantasia M.D.   On: 01/30/2020 05:12   EEG adult  Result Date: 01/30/2020 Lora Havens, MD     01/30/2020  8:55 PM Patient Name: HERMILO DUTTER MRN: [id number] Epilepsy Attending: Lora Havens Referring Physician/Provider: Dr  Cherylann Ratel Date: 01/30/2020 Duration: 24.26 mins Patient history: 54yo M with upper extremity shaking. EEG to evaluate for seizure Level of alertness: Awake,  Asleep AEDs during EEG study: None Technical aspects: This EEG study was done with scalp electrodes positioned according to the 10-20 International system of electrode placement. Electrical activity was acquired at a sampling rate of 500Hz  and reviewed with a high frequency filter of 70Hz  and a low frequency filter of 1Hz . EEG data were recorded continuously and digitally stored. Description: The posterior dominant rhythm consists of 8 Hz activity of moderate voltage (25-35 uV) seen predominantly in posterior head regions, symmetric and reactive to eye opening and eye closing. Sleep was characterized by vertex waves, sleep spindles (12 to 14 Hz), maximal frontocentral region. Hyperventilation and photic stimulation were not performed.   IMPRESSION: This study is within normal limits. No seizures or epileptiform discharges were seen throughout the recording. Lora Havens   ECHOCARDIOGRAM COMPLETE  Result Date: 01/31/2020    ECHOCARDIOGRAM REPORT   Patient Name:   CANTRELL LAROUCHE Date of Exam: 01/31/2020 Medical Rec #:  [id number]           Height:       71.0 in Accession #:    [id number]          Weight:       228.4 lb Date of Birth:  21-Feb-1966           BSA:          2.231 m Patient Age:    26 years            BP:           145/108 mmHg Patient Gender: M                   HR:           108 bpm. Exam Location:  Inpatient Procedure: 2D Echo, Cardiac Doppler, Color Doppler and Intracardiac            Opacification Agent Indications:    CHF-Acute Diastolic 025.42 / H06.23  History:        Patient has prior history of Echocardiogram examinations, most                 recent 12/03/2016. Cardiomyopathy, Arrythmias:Atrial                 Fibrillation and Atrial Flutter; Risk Factors:Hypertension,                 Dyslipidemia and Former Smoker. Elevated  troponin.  Sonographer:    Vickie Epley RDCS Referring Phys: [id number] [facility] D NETTEY  Sonographer Comments: Image acquisition challenging due to respiratory motion. IMPRESSIONS  1. Left ventricular ejection fraction, by estimation, is 45 to 50%. The left ventricle has normal function. The left ventricle demonstrates regional wall motion abnormalities. Abnormal (paradoxical) septal motion, consistent with left bundle branch block. Left ventricular diastolic parameters are indeterminate.  2. Right ventricular systolic function is normal. The right ventricular size is normal. Tricuspid regurgitation signal is inadequate for assessing PA pressure.  3. The mitral valve is normal in structure. No evidence of mitral valve regurgitation.  4. The aortic valve was not  well visualized. Aortic valve regurgitation is not visualized. No aortic stenosis is present.  5. The inferior vena cava is normal in size with greater than 50% respiratory variability, suggesting right atrial pressure of 3 mmHg. FINDINGS  Left Ventricle: Left ventricular ejection fraction, by estimation, is 45 to 50%. The left ventricle has normal function. The left ventricle demonstrates regional wall motion abnormalities. Definity contrast agent was given IV to delineate the left ventricular endocardial borders. The left ventricular internal cavity size was normal in size. There is no left ventricular hypertrophy. Abnormal (paradoxical) septal motion, consistent with left bundle branch block. Left ventricular diastolic parameters  are indeterminate. Right Ventricle: The right ventricular size is normal. Right vetricular wall thickness was not assessed. Right ventricular systolic function is normal. Tricuspid regurgitation signal is inadequate for assessing PA pressure. Left Atrium: Left atrial size was normal in size. Right Atrium: Right atrial size was normal in size. Pericardium: There is no evidence of pericardial effusion. Mitral Valve: The mitral valve is  normal in structure. No evidence of mitral valve regurgitation. Tricuspid Valve: The tricuspid valve is normal in structure. Tricuspid valve regurgitation is not demonstrated. Aortic Valve: The aortic valve was not well visualized. Aortic valve regurgitation is not visualized. No aortic stenosis is present. Pulmonic Valve: The pulmonic valve was not well visualized. Pulmonic valve regurgitation is not visualized. Aorta: The aortic root is normal in size and structure. Venous: The inferior vena cava is normal in size with greater than 50% respiratory variability, suggesting right atrial pressure of 3 mmHg. IAS/Shunts: The interatrial septum was not well visualized.  LEFT VENTRICLE PLAX 2D LVIDd:         4.60 cm      Diastology LVIDs:         3.60 cm      LV e' medial:    5.19 cm/s LV PW:         1.00 cm      LV E/e' medial:  10.8 LV IVS:        1.00 cm      LV e' lateral:   9.24 cm/s LVOT diam:     2.30 cm      LV E/e' lateral: 6.0 LV SV:         73 LV SV Index:   33 LVOT Area:     4.15 cm  LV Volumes (MOD) LV vol d, MOD A2C: 183.0 ml LV vol d, MOD A4C: 159.0 ml LV vol s, MOD A2C: 86.8 ml LV vol s, MOD A4C: 84.2 ml LV SV MOD A2C:     96.2 ml LV SV MOD A4C:     159.0 ml LV SV MOD BP:      83.4 ml RIGHT VENTRICLE RV S prime:     15.30 cm/s TAPSE (M-mode): 1.5 cm LEFT ATRIUM             Index       RIGHT ATRIUM           Index LA diam:        3.40 cm 1.52 cm/m  RA Area:     12.30 cm LA Vol (A2C):   39.2 ml 17.57 ml/m RA Volume:   26.40 ml  11.83 ml/m LA Vol (A4C):   37.7 ml 16.90 ml/m LA Biplane Vol: 42.5 ml 19.05 ml/m  AORTIC VALVE LVOT Vmax:   132.00 cm/s LVOT Vmean:  91.900 cm/s LVOT VTI:    0.175 m MITRAL VALVE MV Area (PHT): 4.49  cm    SHUNTS MV Decel Time: 169 msec    Systemic VTI:  0.18 m MV E velocity: 55.90 cm/s  Systemic Diam: 2.30 cm MV A velocity: 68.60 cm/s MV E/A ratio:  0.81 Oswaldo Milian MD Electronically signed by Oswaldo Milian MD Signature Date/Time: 01/31/2020/1:13:35 PM    Final       Assessment and Plan:   1. Chronic combined CHF/NICM: CXR on admission with vascular congestion, though BNP wnl and patient initially appeared dry on exam. He received some IVF's in the ED, then subsequently given IV lasix 40mg  x1 dose then transitioned to 40mg  po daily 02/01/20. Echo this admission with EF 45-50%, indeterminate LV diastolic function, RWMA c/w LBBB, and no significant valvular abnormalities. Historically EF has been up and down at times (as low as 20s in 2009 and high as 45-50). Home entresto has been held since previous admission due to [facility]. He appears euvolemic at this time.  - Continue carvedilol and lasix - Will discuss addition of bidil with Dr. Stanford Breed as it seems unlikely entresto will be able to be restarted with progression of his CKD. This will also improve HTN control. - Continue to monitor strict I&Os and daily weights  2. Paroxysmal atrial fibrillation: appears to be maintaining sinus rhythm this admission. On amiodarone for rhythm control which he has been on for years. Historically he is unaware of his Afib. No complaints of bleeding. - Continue carvedilol for rate control - Will discuss continuation of amiodarone with Dr. Stanford Breed - Continue eliquis for stroke ppx.   3. HTN: BP persistently elevated this admission despite increasing carvedilol to 6.25mg  BID.  - Continue amlodipine and carvedilol - Will discuss addition of Bidil as above  4. HLD: LDL 174 09/2018; goal <70  - Will check FLP  - Continue atorvastatin 80mg  daily   5. Hyperthyroidism in patient with hypothyroidism: TSH low with elevated FT3/FT4 this admission. On levothyroxine 100 mcg prior to admission.   6. CKD stage 3b: Cr 2.9 on admission, down to 2.32 today which I suspect is his baseline. Follows with nephrology outpatient - Continue to monitor closely and encourage ongoing outpatient follow-up   New York Heart Association (NYHA) Functional Class NYHA Class III   CHA2DS2-VASc Score  = 4  This indicates a 4.8% annual risk of stroke. The patient's score is based upon: CHF History: Yes HTN History: Yes Diabetes History: Yes Stroke History: No Vascular Disease History: Yes Age Score: 0 Gender Score: 0   For questions or updates, please contact [place] Please consult [email or web address] for contact info under  Signed, Abigail Butts, PA-C  02/02/2020 9:19 AM As above, patient seen and examined.  Briefly he is a 54 year old male with past medical history of chronic combined systolic/diastolic congestive heart failure, alcohol abuse, hypertension, hyperlipidemia, paroxysmal atrial fibrillation, diabetes mellitus, hypothyroidism, obstructive sleep apnea, chronic stage IIIb kidney disease, recent Covid infection admitted with urinary tract infection for evaluation of question amiodarone toxicity and chronic combined systolic/diastolic congestive heart failure.  Patient denies increased dyspnea, chest pain, syncope, pedal edema, orthopnea or PND.  Patient admitted with probable UTI.  TSH found to be low with elevated free T4.  Patient on levothyroxine at home which was discontinued.  Cardiology now asked to evaluate. Echocardiogram this admission shows ejection fraction 45 to 50%.  Sodium 133, BUN 64, creatinine 2.32.  TSH less than 0.010 with free T4 4.85.  Electrocardiogram shows sinus rhythm with IVCD. 1 question amiodarone toxicity-TSH is low with elevated free  T4.  However the patient was on Synthroid at time of admission.  This has been discontinued.  Would repeat TSH and free T4 in 12 weeks.  Continue amiodarone at present dose.  2 chronic combined systolic/diastolic congestive heart failure-he appears to be euvolemic on examination.  Continue Lasix at present dose.  3 hypertension-patient's blood pressure is elevated; increase carvedilol to 12.5 mg twice daily.  Can add hydralazine later if blood pressure not controlled with present medical regimen.  4  cardiomyopathy-ejection fraction mildly reduced.  Continue beta-blocker.  Would not resume Entresto given severity of renal insufficiency.  5 chronic stage IIIb kidney disease-follow-up nephrology following discharge.  Would not resume Entresto.  6 paroxysmal atrial fibrillation-patient remains in sinus rhythm.  Continue amiodarone and carvedilol.  Continue apixaban.  7 history of hypothyroidism-TSH is low.  I agree with discontinuing Synthroid as outlined above.  Repeat TSH and free T4 in 12 weeks.  Cardiology will sign off.  Please call with questions.  Follow-up with Dr. Rockey Situ approximately 8 to 12 weeks following discharge. Kirk Ruths, MD

## 2020-02-02 NOTE — TOC Progression Note (Signed)
Transition of Care [facility]) - Progression Note    Patient Details  Name: Hector [name] MRN: [id number] Date of Birth: 04/10/1965  Transition of Care [facility]) CM/SW Contact  Shamela Haydon, Juliann Pulse, RN Phone Number: 02/02/2020, 9:05 AM  Clinical Narrative:  [place] rep Rolla Plate awaiting auth for SNF. Once we have Josem Kaufmann will order rapid covid.     Expected Discharge Plan: [place] Barriers to Discharge: Continued Medical Work up  Expected Discharge Plan and Services Expected Discharge Plan: [place]   Discharge Planning Services: CM Consult Post Acute Care Choice: [place] Living arrangements for the past 2 months: [place] Expected Discharge Date:  (unknown)                                     Social Determinants of Health (SDOH) Interventions    Readmission Risk Interventions Readmission Risk Prevention Plan 02/01/2020 01/02/2020  Transportation Screening Complete Complete  PCP or Specialist Appt within 5-7 Days Complete -  PCP or Specialist Appt within 3-5 Days - Complete  Home Care Screening Complete -  Medication Review (RN CM) Complete -  HRI or [place] - Complete  Social Work Consult for [place] Planning/Counseling - Complete  Palliative Care Screening - Not Applicable  Medication Review Press photographer) - Complete  Some recent data might be hidden

## 2020-02-02 NOTE — TOC Progression Note (Signed)
Transition of Care [facility]) - Progression Note    Patient Details  Name: Hector [name] MRN: [id number] Date of Birth: 1965/11/22  Transition of Care [facility]) CM/SW Contact  Cierrah Dace, Juliann Pulse, RN Phone Number: 02/02/2020, 11:48 AM  Clinical Narrative:  Jaquita Folds has received Josem Kaufmann for SNF return-can d/c over weekend contact [place] [phone] to [id number];nsg call report tel#[phone]. Per attending not medically stable for d/c today. covid ordered.     Expected Discharge Plan: [place] Barriers to Discharge: Continued Medical Work up  Expected Discharge Plan and Services Expected Discharge Plan: [place]   Discharge Planning Services: CM Consult Post Acute Care Choice: [place] Living arrangements for the past 2 months: [place] Expected Discharge Date:  (unknown)                                     Social Determinants of Health (SDOH) Interventions    Readmission Risk Interventions Readmission Risk Prevention Plan 02/01/2020 01/02/2020  Transportation Screening Complete Complete  PCP or Specialist Appt within 5-7 Days Complete -  PCP or Specialist Appt within 3-5 Days - Complete  Home Care Screening Complete -  Medication Review (RN CM) Complete -  HRI or [place] - Complete  Social Work Consult for [place] Planning/Counseling - Complete  Palliative Care Screening - Not Applicable  Medication Review Press photographer) - Complete  Some recent data might be hidden

## 2020-02-02 NOTE — Progress Notes (Signed)
[facility]  PROGRESS NOTE  Hector [name] ERD:[id number] DOB: [birth year] DOA: 01/30/2020 PCP: [name], MD Admit date - 01/30/2020   Admitting Physician Jonnie Finner, DO  Outpatient Primary MD for the patient is [name], MD  LOS - 3 Brief Narrative   Hector [name] is a 54 y.o. year old male with medical history significant for atrial fibrillation on Eliquis, CHF, diabetes, CKD stage III who presented on 01/30/2020 with upper extremity tremors and was found to have UTI, acute on chronic diastolic CHF, and poorly controlled hypertension  Subjective  Today states his breathing is fine, denies any chest pain or palpitations  A & P   Acute on chronic diastolic CHF exacerbation, improving has history of combined CHF.  TTE.  Shows preserved EF with indeterminate diastolic function on admission evidence of vascular congestion on chest x-ray seems consistent with exacerbation.  Patient not expressing any shortness of breath but did report tremors prior to admission.  Likely nidus poorly controlled blood pressure given poorly controlled diastolic blood pressure.  Seemed to respond well to IV Lasix well and now no overt signs of volume overload on exam -Continue oral Lasix 40 mg daily -Monitor volume status -Daily weights, strict I's and O's -Closely monitor blood pressure -Continue Coreg, discontinue further use of Entresto as recommended by cardiology  Hypertension, not at goal.  DBP remains greater than 110 -Continue amlodipine 10 mg, we will further increase Coreg to 12.5 mg twice daily per cardiology, consider addition of hydralazine if not further controlled -Entresto discontinued given renal insufficiency -IV hydralazine as needed   TSH suppressed, T4 elevated, T3 highly concerning for hyperthyroidism given patient did present with tremors and elevated heart rate, though his elevated heart rate could also be seen in the setting of atrial  fibrillation.  Patient also on amiodarone which can induce hyperthyroidism per cardiology suspect this is most likely being treated by Synthroid.  No appreciable goiter on exam.  Currently normal rate, no symptomatic evidence of hyperthyroidism -Discontinued Synthroid -Cardiology consulted, recommend continue amiodarone testing this is less likely induced hypothyroidism, agree with Synthroid and recommend repeating TSH and free T3/T4 labs -Pending thyroid-stimulating immunoglobulins and thyroid receptor autoantibodies -Continue to monitor on telemetry  History of hypothyroidism. -Discontinue home Synthroid given concern for hyperthyroidism  Reported tremors.  Concern could be related to overt hyperthyroidism as expressed above not currently having tremors.  Ammonia within normal limits.  Initially suspected it could be  related to elevated BUN but this seems chronic in etiology and currently CKD is stable. EEG unremarkable -Discontinue lactulose --Continue to monitor  AKI on CKD H3, back to baseline.  Baseline creatinine prior to admission in November 2.2-2.4.  Creatinine of 2.9 on admission likely related to CHF exacerbation and poorly controlled blood pressure. Presented with hyperkalemia that's now resolved --Continue to hold home sacubitril/valsartan -Avoid further nephrotoxins -Monitor output -Monitor BMP  UTI secondary to Klebsiella pneumonia -Awaiting urine culture sensitivities -Continue IV ceftriaxone  Type 2 diabetes, well controlled A1c 7.7 (11/2019) -Sliding scale insulin, monitor CBG  Atrial fibrillation, currently rate controlled -monitor on telemetry -Continue amiodarone as recommended by cardiology and Eliquis  HLD, stable -continue statin  Left hand pain and history of nerve impingement during previous hospitalization back in November.  Patient states Voltaren gel does not help -Start low-dose gabapentin 100 mg 3 times daily, closely monitor    Family  Communication  :  none  Code Status :  FULL  Disposition Plan  :  Patient is from home. Anticipated d/c date:  1-2 days. Barriers to d/c or necessity for inpatient status: monitoring BP, IV ceftriaxone for UTI awaiting urine culture sensitivities working up concern for hyperthyroidism, cardiology Consults  : Cardiology  Procedures  :  TTE, 01/31/20  DVT Prophylaxis  :  eliquis  MDM: The below labs and imaging reports were reviewed and summarized above.  Medication management as above.  Lab Results  Component Value Date   PLT 255 02/01/2020    Diet :  Diet Order            Diet heart healthy/carb modified Room service appropriate? No; Fluid consistency: Thin  Diet effective now                  Inpatient Medications Scheduled Meds: . amiodarone  200 mg Oral Daily  . amLODipine  10 mg Oral Daily  . apixaban  5 mg Oral BID  . atorvastatin  80 mg Oral Daily  . carvedilol  6.25 mg Oral BID WC  . furosemide  40 mg Oral Daily  . gabapentin  100 mg Oral TID  . insulin aspart  0-15 Units Subcutaneous TID WC  . insulin aspart  0-5 Units Subcutaneous QHS   Continuous Infusions: . cefTRIAXone (ROCEPHIN)  IV 1 g (02/02/20 0815)   PRN Meds:.acetaminophen **OR** acetaminophen, hydrALAZINE, Muscle Rub  Antibiotics  :   Anti-infectives (From admission, onward)   Start     Dose/Rate Route Frequency Ordered Stop   01/31/20 0800  cefTRIAXone (ROCEPHIN) 1 g in sodium chloride 0.9 % 100 mL IVPB        1 g 200 mL/hr over 30 Minutes Intravenous Every 24 hours 01/30/20 1517     01/30/20 0530  cefTRIAXone (ROCEPHIN) 1 g in sodium chloride 0.9 % 100 mL IVPB        1 g 200 mL/hr over 30 Minutes Intravenous  Once 01/30/20 0515 01/30/20 0811   01/30/20 0500  cefTRIAXone (ROCEPHIN) 1 g in sodium chloride 0.9 % 100 mL IVPB        1 g 200 mL/hr over 30 Minutes Intravenous  Once 01/30/20 0453 01/30/20 0621       Objective   Vitals:   02/01/20 2124 02/02/20 0528 02/02/20 0529 02/02/20  1400  BP: (!) 138/95 (!) 156/104  (!) 151/96  Pulse: 98 (!) 101  96  Resp: 20 18  20   Temp: (!) 97.4 F (36.3 C) 98.2 F (36.8 C)  97.9 F (36.6 C)  TempSrc: Oral Oral  Oral  SpO2: 91% 93%  91%  Weight:   105.2 kg   Height:        SpO2: 91 %  Wt Readings from Last 3 Encounters:  02/02/20 105.2 kg  01/23/20 124.3 kg  01/12/20 117.7 kg     Intake/Output Summary (Last 24 hours) at 02/02/2020 1543 Last data filed at 02/02/2020 1400 Gross per 24 hour  Intake 260 ml  Output 1350 ml  Net -1090 ml    Physical Exam:   Awake Alert, Oriented X 3, Normal affect No proptosis, no thyromegaly/goiter appreciated No new F.N deficits,  [place].AT, Normal respiratory effort on room air, lungs clear to auscultation bilaterally Irregularly irregular, normal rate, no peripheral edema +ve B.Sounds, Abd Soft, No tenderness, No rebound, guarding or rigidity. No Cyanosis, No new Rash or bruise  Left hand grip decreased   I have personally reviewed the following:   Data Reviewed:  CBC Recent Labs  Lab 01/30/20 0210  01/30/20 0701 01/31/20 1002 02/01/20 0519  WBC 17.7*  --  10.6* 9.6  HGB 14.6 14.3 12.1* 13.1  HCT 47.3 42.0 37.8* 40.6  PLT 315  --  247 255  MCV 91.5  --  89.4 87.5  MCH 28.2  --  28.6 28.2  MCHC 30.9  --  32.0 32.3  RDW 15.3  --  15.3 15.2  LYMPHSABS 2.9  --   --   --   MONOABS 1.4*  --   --   --   EOSABS 0.1  --   --   --   BASOSABS 0.1  --   --   --     Chemistries  Recent Labs  Lab 01/30/20 0210 01/30/20 0701 01/30/20 1038 01/31/20 1002 02/01/20 0519 02/02/20 0727  NA 136 136  --  134* 135 133*  K 5.4* 5.7*  --  4.3 4.3 5.1  CL 104 106  --  103 101 100  CO2 14*  --   --  16* 22 18*  GLUCOSE 127* 84  --  135* 97 88  BUN 63* 80*  --  60* 58* 64*  CREATININE 2.79* 2.90*  --  2.51* 2.21* 2.32*  CALCIUM 10.1  --   --  8.9 9.4 9.3  AST  --   --  40 34  --   --   ALT  --   --  45* 41  --   --   ALKPHOS  --   --  100 84  --   --   BILITOT  --   --   1.5* 1.2  --   --    ------------------------------------------------------------------------------------------------------------------ No results for input(s): CHOL, HDL, LDLCALC, TRIG, CHOLHDL, LDLDIRECT in the last 72 hours.  Lab Results  Component Value Date   HGBA1C 7.7 (H) 12/23/2019   ------------------------------------------------------------------------------------------------------------------ Recent Labs    02/01/20 0519 02/01/20 0832  TSH <0.010*  --   T3FREE  --  16.0*   ------------------------------------------------------------------------------------------------------------------ No results for input(s): VITAMINB12, FOLATE, FERRITIN, TIBC, IRON, RETICCTPCT in the last 72 hours.  Coagulation profile No results for input(s): INR, PROTIME in the last 168 hours.  No results for input(s): DDIMER in the last 72 hours.  Cardiac Enzymes No results for input(s): CKMB, TROPONINI, MYOGLOBIN in the last 168 hours.  Invalid input(s): CK ------------------------------------------------------------------------------------------------------------------    Component Value Date/Time   BNP 74.1 01/30/2020 1038    Micro Results Recent Results (from the past 240 hour(s))  Urine culture     Status: Abnormal   Collection Time: 01/30/20  3:26 AM   Specimen: Urine, Random  Result Value Ref Range Status   Specimen Description   Final    URINE, RANDOM Performed at [facility] [address], [place], [place] [address]    Special Requests   Final    NONE Performed at [facility], [place] [address] [place], [place] [address]    Culture MULTIPLE SPECIES PRESENT, SUGGEST RECOLLECTION (A)  Final   Report Status 01/31/2020 FINAL  Final  Blood culture (routine x 2)     Status: None (Preliminary result)   Collection Time: 01/30/20  4:45 AM   Specimen: BLOOD RIGHT HAND  Result Value Ref Range Status   Specimen Description   Final    BLOOD  RIGHT HAND Performed at [facility] [address], [place], [place] [address]    Special Requests   Final    BOTTLES DRAWN AEROBIC AND  ANAEROBIC Blood Culture results may not be optimal due to an inadequate volume of blood received in culture bottles Performed at [facility], [place] [address] [place], [place] [address]    Culture   Final    NO GROWTH 3 DAYS Performed at [facility], [place] [address], [place], [place] [address]    Report Status PENDING  Incomplete  Blood culture (routine x 2)     Status: None (Preliminary result)   Collection Time: 01/30/20 10:38 AM   Specimen: BLOOD  Result Value Ref Range Status   Specimen Description   Final    BLOOD BLOOD LEFT FOREARM Performed at [facility] [address], [place], [place] [address]    Special Requests   Final    BOTTLES DRAWN AEROBIC AND ANAEROBIC Blood Culture adequate volume Performed at [facility] [address], [place], [place] [address]    Culture   Final    NO GROWTH 3 DAYS Performed at [facility], [place] [address] [place], [place] [address]    Report Status PENDING  Incomplete  Culture, Urine     Status: Abnormal (Preliminary result)   Collection Time: 02/01/20  2:18 PM   Specimen: Urine, Random  Result Value Ref Range Status   Specimen Description   Final    URINE, RANDOM Performed at [facility] [address] [place], [place] [address]    Special Requests   Final    NONE Performed at [facility], [place] [address], [place], [place] [address]    Culture (A)  Final    50,000 COLONIES/mL KLEBSIELLA PNEUMONIAE SUSCEPTIBILITIES TO FOLLOW Performed at [facility], [place] [address], [place], [place] [address]    Report Status PENDING  Incomplete  SARS Coronavirus 2 by RT PCR (hospital order, performed in [facility] hospital lab) Nasopharyngeal Nasopharyngeal Swab      Status: None   Collection Time: 02/02/20 11:55 AM   Specimen: Nasopharyngeal Swab  Result Value Ref Range Status   SARS Coronavirus 2 NEGATIVE NEGATIVE Final    Comment: (NOTE) SARS-CoV-2 target nucleic acids are NOT DETECTED.  The SARS-CoV-2 RNA is generally detectable in upper and lower respiratory specimens during the acute phase of infection. The lowest concentration of SARS-CoV-2 viral copies this assay can detect is 250 copies / mL. A negative result does not preclude SARS-CoV-2 infection and should not be used as the sole basis for treatment or other patient management decisions.  A negative result may occur with improper specimen collection / handling, submission of specimen other than nasopharyngeal swab, presence of viral mutation(s) within the areas targeted by this assay, and inadequate number of viral copies (<250 copies / mL). A negative result must be combined with clinical observations, patient history, and epidemiological information.  Fact Sheet for Patients:   StrictlyIdeas.no  Fact Sheet for Healthcare Providers: BankingDealers.co.za  This test is not yet approved or  cleared by the [place] FDA and has been authorized for detection and/or diagnosis of SARS-CoV-2 by FDA under an Emergency Use Authorization (EUA).  This EUA will remain in effect (meaning this test can be used) for the duration of the COVID-19 declaration under Section 564(b)(1) of the Act, 21 U.S.C. section 360bbb-3(b)(1), unless the authorization is terminated or revoked sooner.  Performed at [facility], [place] [address] [place], [place] [address]     Radiology Reports DG Chest 2 View  Result Date:  01/30/2020 CLINICAL DATA:  Shortness of breath and elevated white blood cell count EXAM: CHEST - 2 VIEW COMPARISON:  12/28/2019 FINDINGS: Cardiomegaly and vascular pedicle widening with aortic tortuosity. There is improved  aeration since prior. Congested appearance of vessels without focal pneumonia. No visible effusion or pneumothorax. IMPRESSION: Cardiomegaly and vascular congestion. Aeration is improved since comparison last month. Electronically Signed   By: Monte Fantasia M.D.   On: 01/30/2020 05:12   DG Hand 2 View Left  Result Date: 01/10/2020 CLINICAL DATA:  Left hand pain and swelling EXAM: LEFT HAND - 2 VIEW COMPARISON:  None. FINDINGS: There is no evidence of fracture or dislocation. There is no evidence of arthropathy or other focal bone abnormality. Soft tissues are unremarkable. IMPRESSION: Negative. Electronically Signed   By: Prudencio Pair M.D.   On: 01/10/2020 15:50   US Venous Img Upper Uni Left (DVT)  Result Date: 01/07/2020 CLINICAL DATA:  Pain left arm x2 weeks EXAM: LEFT UPPER EXTREMITY VENOUS DOPPLER ULTRASOUND TECHNIQUE: Gray-scale sonography with graded compression, as well as color Doppler and duplex ultrasound were performed to evaluate the upper extremity deep venous system from the level of the subclavian vein and including the jugular, axillary, basilic, radial, ulnar and upper cephalic vein. Spectral Doppler was utilized to evaluate flow at rest and with distal augmentation maneuvers. COMPARISON:  None. FINDINGS: Contralateral Subclavian Vein: Respiratory phasicity is normal and symmetric with the symptomatic side. No evidence of thrombus. Normal compressibility. Internal Jugular Vein: No evidence of thrombus. Normal compressibility, respiratory phasicity and response to augmentation. Subclavian Vein: No evidence of thrombus. Normal compressibility, respiratory phasicity and response to augmentation. Axillary Vein: No evidence of thrombus. Normal compressibility, respiratory phasicity and response to augmentation. Cephalic Vein: No evidence of thrombus. Normal compressibility, respiratory phasicity and response to augmentation. Basilic Vein: No evidence of thrombus. Normal compressibility,  respiratory phasicity and response to augmentation. Brachial Veins: No evidence of thrombus. Normal compressibility, respiratory phasicity and response to augmentation. Radial Veins: No evidence of thrombus. Normal compressibility, respiratory phasicity and response to augmentation. Ulnar Veins: No evidence of thrombus. Normal compressibility, respiratory phasicity and response to augmentation. Other Findings:  None visualized. IMPRESSION: No evidence of DVT within the left upper extremity. Electronically Signed   By: Constance Holster M.D.   On: 01/07/2020 18:44   EEG adult  Result Date: 01/30/2020 Lora Havens, MD     01/30/2020  8:55 PM Patient Name: Hector [name] MRN: [id number] Epilepsy Attending: Lora Havens Referring Physician/Provider: Dr Cherylann Ratel Date: 01/30/2020 Duration: 24.26 mins Patient history: 54yo M with upper extremity shaking. EEG to evaluate for seizure Level of alertness: Awake,  Asleep AEDs during EEG study: None Technical aspects: This EEG study was done with scalp electrodes positioned according to the 10-20 International system of electrode placement. Electrical activity was acquired at a sampling rate of 500Hz  and reviewed with a high frequency filter of 70Hz  and a low frequency filter of 1Hz . EEG data were recorded continuously and digitally stored. Description: The posterior dominant rhythm consists of 8 Hz activity of moderate voltage (25-35 uV) seen predominantly in posterior head regions, symmetric and reactive to eye opening and eye closing. Sleep was characterized by vertex waves, sleep spindles (12 to 14 Hz), maximal frontocentral region. Hyperventilation and photic stimulation were not performed.   IMPRESSION: This study is within normal limits. No seizures or epileptiform discharges were seen throughout the recording. Lora Havens   ECHOCARDIOGRAM COMPLETE  Result Date: 01/31/2020    ECHOCARDIOGRAM  REPORT   Patient Name:   DINERO CHAVIRA Date of  Exam: 01/31/2020 Medical Rec #:  [id number]           Height:       71.0 in Accession #:    [id number]          Weight:       228.4 lb Date of Birth:  [birth year]           BSA:          2.231 m Patient Age:    73 years            BP:           145/108 mmHg Patient Gender: M                   HR:           108 bpm. Exam Location:  Inpatient Procedure: 2D Echo, Cardiac Doppler, Color Doppler and Intracardiac            Opacification Agent Indications:    CHF-Acute Diastolic 062.37 / S28.31  History:        Patient has prior history of Echocardiogram examinations, most                 recent 12/03/2016. Cardiomyopathy, Arrythmias:Atrial                 Fibrillation and Atrial Flutter; Risk Factors:Hypertension,                 Dyslipidemia and Former Smoker. Elevated troponin.  Sonographer:    Vickie Epley RDCS Referring Phys: [id number] [facility] D Ralphael [place]  Sonographer Comments: Image acquisition challenging due to respiratory motion. IMPRESSIONS  1. Left ventricular ejection fraction, by estimation, is 45 to 50%. The left ventricle has normal function. The left ventricle demonstrates regional wall motion abnormalities. Abnormal (paradoxical) septal motion, consistent with left bundle branch block. Left ventricular diastolic parameters are indeterminate.  2. Right ventricular systolic function is normal. The right ventricular size is normal. Tricuspid regurgitation signal is inadequate for assessing PA pressure.  3. The mitral valve is normal in structure. No evidence of mitral valve regurgitation.  4. The aortic valve was not well visualized. Aortic valve regurgitation is not visualized. No aortic stenosis is present.  5. The inferior vena cava is normal in size with greater than 50% respiratory variability, suggesting right atrial pressure of 3 mmHg. FINDINGS  Left Ventricle: Left ventricular ejection fraction, by estimation, is 45 to 50%. The left ventricle has normal function. The left ventricle demonstrates regional wall  motion abnormalities. Definity contrast agent was given IV to delineate the left ventricular endocardial borders. The left ventricular internal cavity size was normal in size. There is no left ventricular hypertrophy. Abnormal (paradoxical) septal motion, consistent with left bundle branch block. Left ventricular diastolic parameters  are indeterminate. Right Ventricle: The right ventricular size is normal. Right vetricular wall thickness was not assessed. Right ventricular systolic function is normal. Tricuspid regurgitation signal is inadequate for assessing PA pressure. Left Atrium: Left atrial size was normal in size. Right Atrium: Right atrial size was normal in size. Pericardium: There is no evidence of pericardial effusion. Mitral Valve: The mitral valve is normal in structure. No evidence of mitral valve regurgitation. Tricuspid Valve: The tricuspid valve is normal in structure. Tricuspid valve regurgitation is not demonstrated. Aortic Valve: The aortic valve was not well visualized. Aortic valve regurgitation is not visualized. No aortic stenosis is present.  Pulmonic Valve: The pulmonic valve was not well visualized. Pulmonic valve regurgitation is not visualized. Aorta: The aortic root is normal in size and structure. Venous: The inferior vena cava is normal in size with greater than 50% respiratory variability, suggesting right atrial pressure of 3 mmHg. IAS/Shunts: The interatrial septum was not well visualized.  LEFT VENTRICLE PLAX 2D LVIDd:         4.60 cm      Diastology LVIDs:         3.60 cm      LV e' medial:    5.19 cm/s LV PW:         1.00 cm      LV E/e' medial:  10.8 LV IVS:        1.00 cm      LV e' lateral:   9.24 cm/s LVOT diam:     2.30 cm      LV E/e' lateral: 6.0 LV SV:         73 LV SV Index:   33 LVOT Area:     4.15 cm  LV Volumes (MOD) LV vol d, MOD A2C: 183.0 ml LV vol d, MOD A4C: 159.0 ml LV vol s, MOD A2C: 86.8 ml LV vol s, MOD A4C: 84.2 ml LV SV MOD A2C:     96.2 ml LV SV MOD A4C:      159.0 ml LV SV MOD BP:      83.4 ml RIGHT VENTRICLE RV S prime:     15.30 cm/s TAPSE (M-mode): 1.5 cm LEFT ATRIUM             Index       RIGHT ATRIUM           Index LA diam:        3.40 cm 1.52 cm/m  RA Area:     12.30 cm LA Vol (A2C):   39.2 ml 17.57 ml/m RA Volume:   26.40 ml  11.83 ml/m LA Vol (A4C):   37.7 ml 16.90 ml/m LA Biplane Vol: 42.5 ml 19.05 ml/m  AORTIC VALVE LVOT Vmax:   132.00 cm/s LVOT Vmean:  91.900 cm/s LVOT VTI:    0.175 m MITRAL VALVE MV Area (PHT): 4.49 cm    SHUNTS MV Decel Time: 169 msec    Systemic VTI:  0.18 m MV E velocity: 55.90 cm/s  Systemic Diam: 2.30 cm MV A velocity: 68.60 cm/s MV E/A ratio:  0.81 Oswaldo Milian MD Electronically signed by Oswaldo Milian MD Signature Date/Time: 01/31/2020/1:13:35 PM    Final      Time Spent in minutes  30     Desiree Hane M.D on 02/02/2020 at 3:43 PM  To page go to [email or web address] - password [facility]

## 2020-02-03 ENCOUNTER — Inpatient Hospital Stay (HOSPITAL_COMMUNITY): Payer: Medicaid Other

## 2020-02-03 ENCOUNTER — Encounter (HOSPITAL_COMMUNITY): Payer: Self-pay | Admitting: Internal Medicine

## 2020-02-03 DIAGNOSIS — I5023 Acute on chronic systolic (congestive) heart failure: Secondary | ICD-10-CM

## 2020-02-03 DIAGNOSIS — E0590 Thyrotoxicosis, unspecified without thyrotoxic crisis or storm: Secondary | ICD-10-CM | POA: Diagnosis present

## 2020-02-03 DIAGNOSIS — N170 Acute kidney failure with tubular necrosis: Secondary | ICD-10-CM

## 2020-02-03 DIAGNOSIS — I480 Paroxysmal atrial fibrillation: Secondary | ICD-10-CM

## 2020-02-03 DIAGNOSIS — N1832 Chronic kidney disease, stage 3b: Secondary | ICD-10-CM

## 2020-02-03 DIAGNOSIS — M25522 Pain in left elbow: Secondary | ICD-10-CM | POA: Clinically undetermined

## 2020-02-03 LAB — URINE CULTURE: Culture: 50000 — AB

## 2020-02-03 LAB — LIPID PANEL
Cholesterol: 89 mg/dL (ref 0–200)
HDL: 15 mg/dL — ABNORMAL LOW (ref >40)
LDL Cholesterol: 49 mg/dL (ref 0–99)
Total CHOL/HDL Ratio: 5.9 RATIO
Triglycerides: 125 mg/dL (ref <150)
VLDL: 25 mg/dL (ref 0–40)

## 2020-02-03 LAB — BASIC METABOLIC PANEL
Anion gap: 12 (ref 5–15)
BUN: 71 mg/dL — ABNORMAL HIGH (ref 6–20)
CO2: 22 mmol/L (ref 22–32)
Calcium: 9.6 mg/dL (ref 8.9–10.3)
Chloride: 98 mmol/L (ref 98–111)
Creatinine, Ser: 2.37 mg/dL — ABNORMAL HIGH (ref 0.61–1.24)
GFR, Estimated: 32 mL/min — ABNORMAL LOW (ref >60)
Glucose, Bld: 110 mg/dL — ABNORMAL HIGH (ref 70–99)
Potassium: 4.6 mmol/L (ref 3.5–5.1)
Sodium: 132 mmol/L — ABNORMAL LOW (ref 135–145)

## 2020-02-03 LAB — THYROTROPIN RECEPTOR AUTOABS: Thyrotropin Receptor Ab: <1.1 IU/L (ref 0.00–1.75)

## 2020-02-03 LAB — GLUCOSE, CAPILLARY
Glucose-Capillary: 92 mg/dL (ref 70–99)
Glucose-Capillary: 84 mg/dL (ref 70–99)
Glucose-Capillary: 115 mg/dL — ABNORMAL HIGH (ref 70–99)
Glucose-Capillary: 105 mg/dL — ABNORMAL HIGH (ref 70–99)

## 2020-02-03 IMAGING — DX DG ELBOW 2V*L*
3 series · 3 of 3 positions shown · non-contrast
Comparison: None.

CLINICAL DATA: Pain and swelling for 4 days, no known injury

EXAM:
LEFT ELBOW - 2 VIEW

[elbow ap (1 of 2)]
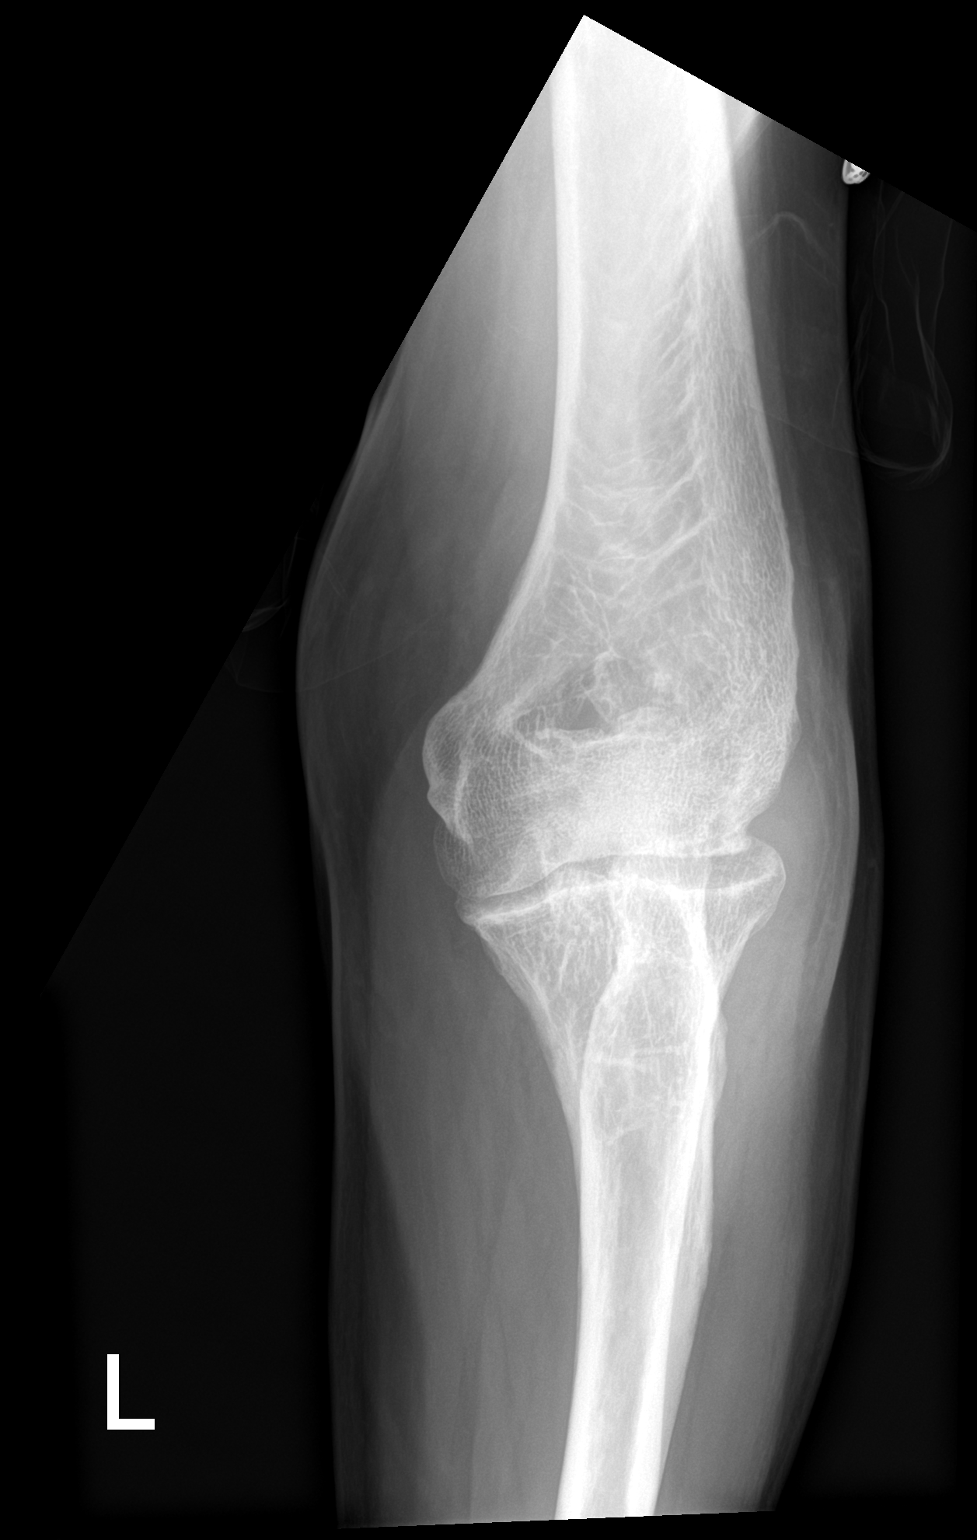

[elbow lat]
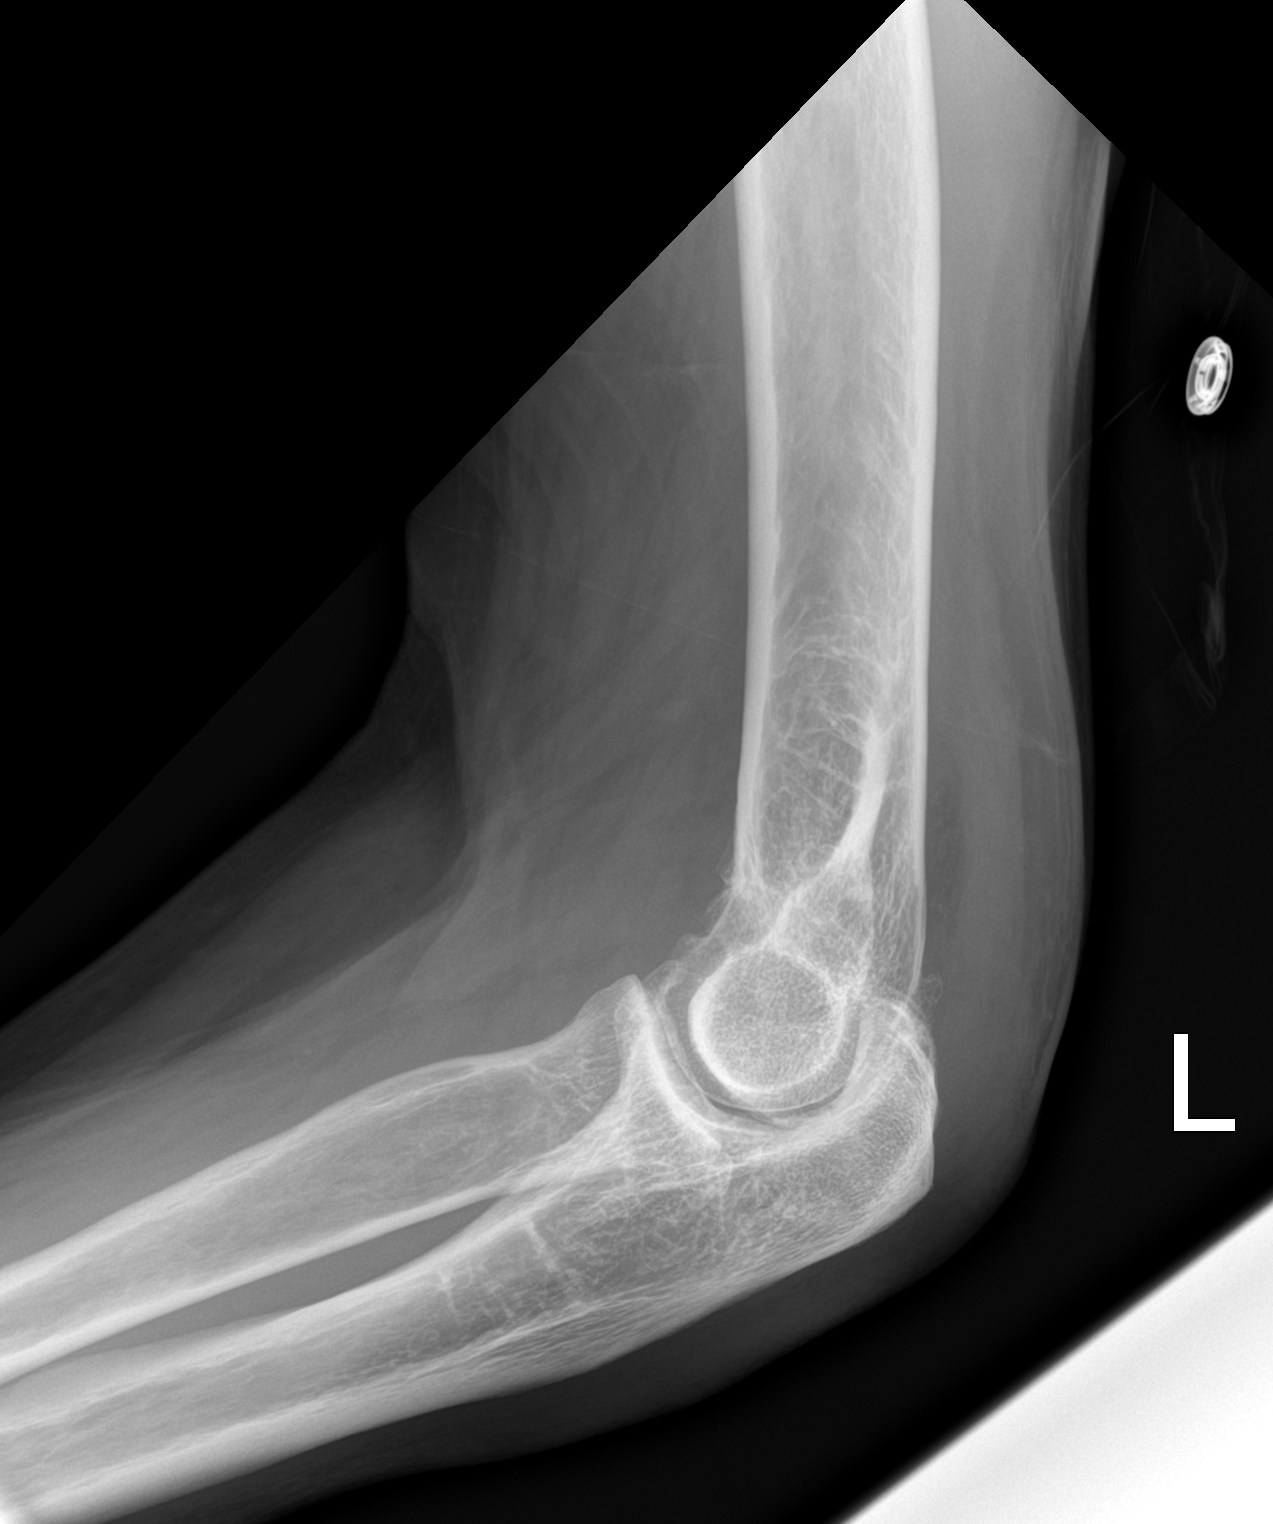

[elbow ap (2 of 2)]
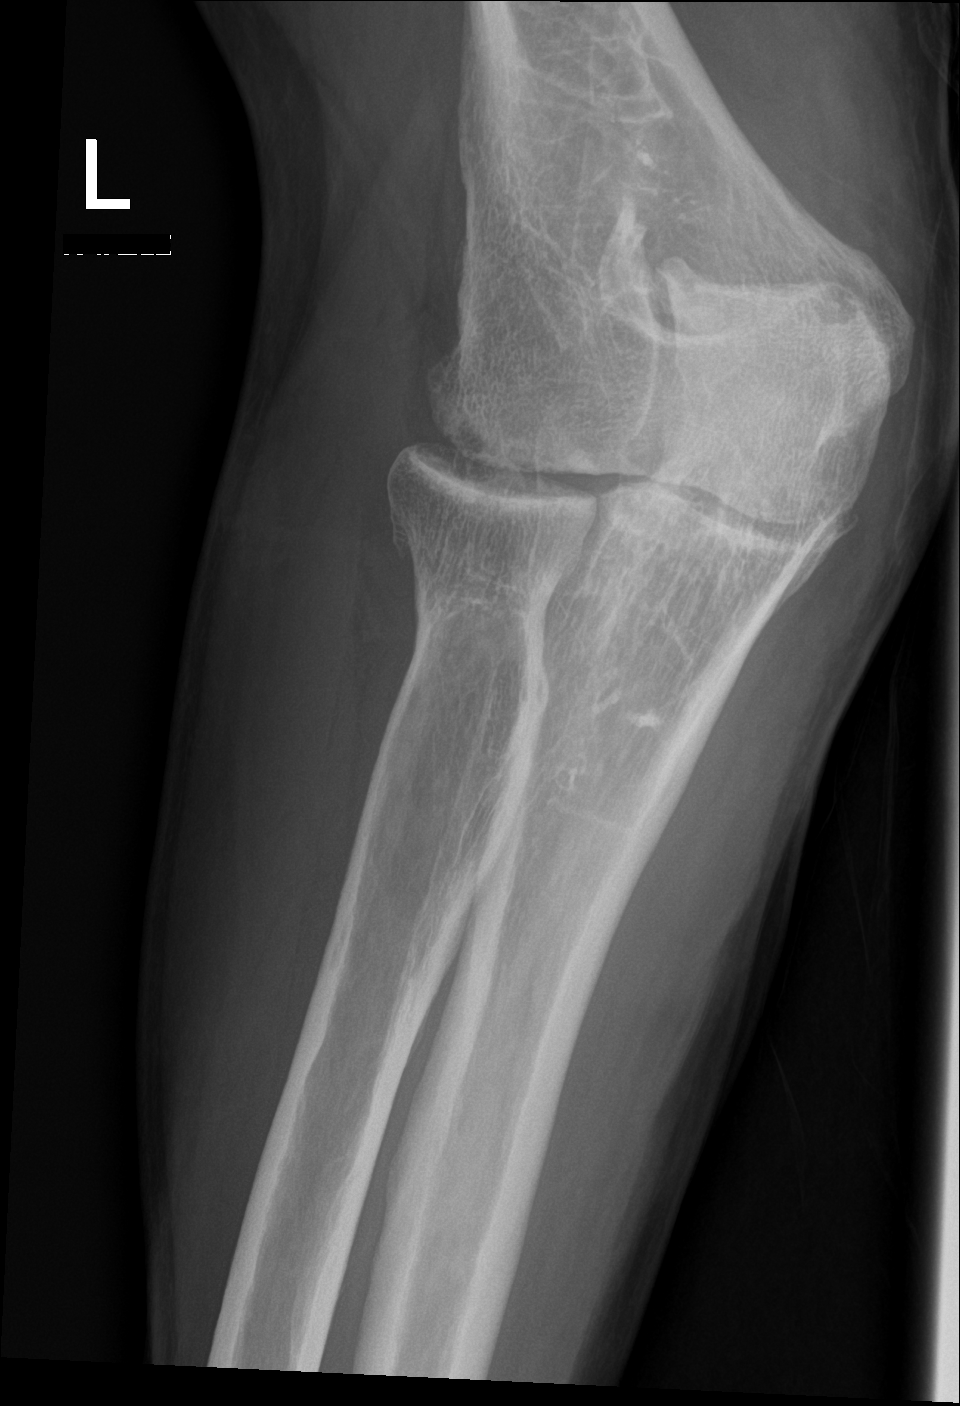

[3 of 3 positions shown; findings below may reference images not displayed]

FINDINGS: There is no evidence of fracture, dislocation, or joint effusion.
Moderate arthrosis. Soft tissue edema posteriorly.
IMPRESSION: 1. No fracture or dislocation of the left elbow. Moderate arthrosis.

2.  Soft tissue edema posteriorly.

## 2020-02-03 MED ORDER — LEVOTHYROXINE SODIUM 50 MCG PO TABS
50.0000 ug | ORAL_TABLET | Freq: Every day | ORAL | Status: DC
  Administered 2020-02-04 – 2020-02-08 (×5): 50 ug via ORAL
  Filled 2020-02-03 (×5): qty 1

## 2020-02-03 NOTE — Progress Notes (Signed)
[facility]  PROGRESS NOTE  AVEN CHRISTEN VZD:[id number] DOB: 03-13-1965 DOA: 01/30/2020 PCP: [name], MD Admit date - 01/30/2020   Admitting Physician Jonnie Finner, DO  Outpatient Primary MD for the patient is [name], MD  LOS - 4 Brief Narrative   Hector [name] is a 54 y.o. year old male with medical history significant for atrial fibrillation on Eliquis, CHF, diabetes, CKD stage III who presented on 01/30/2020 with upper extremity tremors and was found to have UTI, acute on chronic diastolic CHF, and poorly controlled hypertension  Subjective  Today states his breathing is fine, denies any chest pain or palpitations  A & P   Left elbow pain, with some swelling. Patient reports history of gout. X-ray shows no effusion or fracture, moderate arthrosis with soft tissue edema. Remains afebrile, hemodynamically stable, doubt gout with expressing this joint -Supportive care with cool compresses, analgesics, closely monitor  Evidence of hyperthyroidism in a patient with history of hypothyroidism. Suspect exogenous Synthroid is likely culprit. T3, free 16, free T4 4. Cardiology does not think amiodarone has induced this toxicity. Patient has low-grade tachycardia but otherwise no overt symptoms of hyperthyroidism. Receptor antibodies negative -Thyroid-stimulating immunoglobulins pending -Synthroid was initially discontinued, will restart at low dose -Will need repeat TSH, T3, T4 as outpatient - to monitor on telemetry  Acute on chronic diastolic CHF exacerbation, improving has history of combined CHF.  TTE.  Shows preserved EF with indeterminate diastolic function on admission evidence of vascular congestion on chest x-ray seems consistent with exacerbation.  Patient not expressing any shortness of breath but did report tremors prior to admission.  Likely nidus poorly controlled blood pressure given poorly controlled diastolic blood pressure.  Seemed to  respond well to IV Lasix well and now no overt signs of volume overload on exam -Continue oral Lasix 40 mg daily -Monitor volume status -Daily weights, strict I's and O's -Closely monitor blood pressure -Continue Coreg, discontinue further use of Entresto as recommended by cardiology  Hypertension, has improved -Continue amlodipine 10 mg,Coreg to 12.5 mg twice daily per cardiology, consider addition of hydralazine if not further controlled -Entresto discontinued given renal insufficiency -IV hydralazine as needed   Reported tremors, resolved. 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  Initially suspected it could be  related to elevated BUN but this seems chronic in etiology and currently CKD is stable. EEG unremarkable -Discontinued lactulose --Continue to monitor  AKI on CKD stage 3, back to baseline.  Baseline creatinine prior to admission in November 2.2-2.4.  Creatinine of 2.9 on admission likely related to CHF exacerbation and poorly controlled blood pressure. Presented with hyperkalemia that's now resolved --Continue to hold home sacubitril/valsartan -Avoid further nephrotoxins -Monitor output -Monitor BMP  UTI secondary to Klebsiella pneumonia Denies any urinary symptoms. Urine sensitivity shows resistance to ceftriaxone. Has been on ceftriaxone throughout hospital stay without any fevers or complaints of urinary symptoms. Suspect poor sample, given random -Discontinue IV ceftriaxone  Type 2 diabetes, well controlled A1c 7.7 (11/2019) -Sliding scale insulin, monitor CBG  Atrial fibrillation, currently rate controlled -monitor on telemetry -Continue amiodarone as recommended by cardiology and Eliquis  HLD, stable -continue statin  Left hand pain and history of nerve impingement during previous hospitalization back in November.  Patient states Voltaren gel does not help -Started low-dose gabapentin 100 mg 3 times daily,  closely monitor    Family Communication  :  none  Code Status :  FULL  Disposition Plan  :  Patient is from home. Anticipated d/c date:  1-2 days. Barriers to d/c or necessity for inpatient status: monitoring BP, restarting Synthroid, close monitoring any symptoms of overt hyperthyroidism Consults  : Cardiology  Procedures  :  TTE, 01/31/20  DVT Prophylaxis  :  eliquis  MDM: The below labs and imaging reports were reviewed and summarized above.  Medication management as above.  Lab Results  Component Value Date   PLT 255 02/01/2020    Diet :  Diet Order            Diet heart healthy/carb modified Room service appropriate? No; Fluid consistency: Thin  Diet effective now                  Inpatient Medications Scheduled Meds: . amiodarone  200 mg Oral Daily  . amLODipine  10 mg Oral Daily  . apixaban  5 mg Oral BID  . atorvastatin  80 mg Oral Daily  . carvedilol  6.25 mg Oral BID WC  . furosemide  40 mg Oral Daily  . gabapentin  100 mg Oral TID  . insulin aspart  0-15 Units Subcutaneous TID WC  . insulin aspart  0-5 Units Subcutaneous QHS   Continuous Infusions:  PRN Meds:.acetaminophen **OR** acetaminophen, hydrALAZINE, Muscle Rub  Antibiotics  :   Anti-infectives (From admission, onward)   Start     Dose/Rate Route Frequency Ordered Stop   01/31/20 0800  cefTRIAXone (ROCEPHIN) 1 g in sodium chloride 0.9 % 100 mL IVPB  Status:  Discontinued        1 g 200 mL/hr over 30 Minutes Intravenous Every 24 hours 01/30/20 1517 02/03/20 1245   01/30/20 0530  cefTRIAXone (ROCEPHIN) 1 g in sodium chloride 0.9 % 100 mL IVPB        1 g 200 mL/hr over 30 Minutes Intravenous  Once 01/30/20 0515 01/30/20 0811   01/30/20 0500  cefTRIAXone (ROCEPHIN) 1 g in sodium chloride 0.9 % 100 mL IVPB        1 g 200 mL/hr over 30 Minutes Intravenous  Once 01/30/20 0453 01/30/20 0621       Objective   Vitals:   02/03/20 0535 02/03/20 1035 02/03/20 1149 02/03/20 1300  BP: (!) 124/93  (!) 133/101 (!) 137/98 126/88  Pulse: (!) 107  (!) 106 95  Resp: 19   (!) 25  Temp: 98.4 F (36.9 C)   98.3 F (36.8 C)  TempSrc: Oral   Oral  SpO2: 92%  90%   Weight:      Height:        SpO2: 90 %  Wt Readings from Last 3 Encounters:  02/03/20 106.2 kg  01/23/20 124.3 kg  01/12/20 117.7 kg     Intake/Output Summary (Last 24 hours) at 02/03/2020 1513 Last data filed at 02/03/2020 1100 Gross per 24 hour  Intake 700 ml  Output 950 ml  Net -250 ml    Physical Exam:   Awake Alert, Oriented X 3, Normal affect No proptosis, no thyromegaly/goiter appreciated No new F.N deficits,  [place].AT, Normal respiratory effort on room air, lungs clear to auscultation bilaterally Irregularly irregular, tachycardic, no peripheral edema +ve B.Sounds, Abd Soft, No tenderness, No rebound, guarding or rigidity. No Cyanosis, No new Rash or bruise  Left hand grip decreased Left elbow slightly warm to touch, with slight increase swelling in comparison to right   I have personally reviewed the following:   Data Reviewed:  CBC  Recent Labs  Lab 01/30/20 0210 01/30/20 0701 01/31/20 1002 02/01/20 0519  WBC 17.7*  --  10.6* 9.6  HGB 14.6 14.3 12.1* 13.1  HCT 47.3 42.0 37.8* 40.6  PLT 315  --  247 255  MCV 91.5  --  89.4 87.5  MCH 28.2  --  28.6 28.2  MCHC 30.9  --  32.0 32.3  RDW 15.3  --  15.3 15.2  LYMPHSABS 2.9  --   --   --   MONOABS 1.4*  --   --   --   EOSABS 0.1  --   --   --   BASOSABS 0.1  --   --   --     Chemistries  Recent Labs  Lab 01/30/20 0210 01/30/20 0701 01/30/20 1038 01/31/20 1002 02/01/20 0519 02/02/20 0727 02/03/20 0823  NA 136 136  --  134* 135 133* 132*  K 5.4* 5.7*  --  4.3 4.3 5.1 4.6  CL 104 106  --  103 101 100 98  CO2 14*  --   --  16* 22 18* 22  GLUCOSE 127* 84  --  135* 97 88 110*  BUN 63* 80*  --  60* 58* 64* 71*  CREATININE 2.79* 2.90*  --  2.51* 2.21* 2.32* 2.37*  CALCIUM 10.1  --   --  8.9 9.4 9.3 9.6  AST  --   --  40 34  --   --    --   ALT  --   --  45* 41  --   --   --   ALKPHOS  --   --  100 84  --   --   --   BILITOT  --   --  1.5* 1.2  --   --   --    ------------------------------------------------------------------------------------------------------------------ Recent Labs    02/03/20 0602  CHOL 89  HDL 15*  LDLCALC 49  TRIG 125  CHOLHDL 5.9    Lab Results  Component Value Date   HGBA1C 7.7 (H) 12/23/2019   ------------------------------------------------------------------------------------------------------------------ Recent Labs    02/01/20 0519 02/01/20 0832  TSH <0.010*  --   T3FREE  --  16.0*   ------------------------------------------------------------------------------------------------------------------ No results for input(s): VITAMINB12, FOLATE, FERRITIN, TIBC, IRON, RETICCTPCT in the last 72 hours.  Coagulation profile No results for input(s): INR, PROTIME in the last 168 hours.  No results for input(s): DDIMER in the last 72 hours.  Cardiac Enzymes No results for input(s): CKMB, TROPONINI, MYOGLOBIN in the last 168 hours.  Invalid input(s): CK ------------------------------------------------------------------------------------------------------------------    Component Value Date/Time   BNP 74.1 01/30/2020 1038    Micro Results Recent Results (from the past 240 hour(s))  Urine culture     Status: Abnormal   Collection Time: 01/30/20  3:26 AM   Specimen: Urine, Random  Result Value Ref Range Status   Specimen Description   Final    URINE, RANDOM Performed at [facility] [address], [place], [place] [address]    Special Requests   Final    NONE Performed at [facility], [place] [address] [place], [place] [address]    Culture MULTIPLE SPECIES PRESENT, SUGGEST RECOLLECTION (A)  Final   Report Status 01/31/2020 FINAL  Final  Blood culture (routine x 2)     Status: None (Preliminary result)   Collection Time: 01/30/20  4:45  AM   Specimen: BLOOD RIGHT HAND  Result Value Ref Range Status   Specimen Description  Final    BLOOD RIGHT HAND Performed at [facility], [place] [address] [place], [place] [address]    Special Requests   Final    BOTTLES DRAWN AEROBIC AND ANAEROBIC Blood Culture results may not be optimal due to an inadequate volume of blood received in culture bottles Performed at [facility] [address], [place], [place] [address]    Culture   Final    NO GROWTH 4 DAYS Performed at [name], [place] [address] [place], [place] [address]    Report Status PENDING  Incomplete  Blood culture (routine x 2)     Status: None (Preliminary result)   Collection Time: 01/30/20 10:38 AM   Specimen: BLOOD  Result Value Ref Range Status   Specimen Description   Final    BLOOD BLOOD LEFT FOREARM Performed at [facility] [address], [place], [place] [address]    Special Requests   Final    BOTTLES DRAWN AEROBIC AND ANAEROBIC Blood Culture adequate volume Performed at [facility] [address], [place], [place] [address]    Culture   Final    NO GROWTH 4 DAYS Performed at [facility], [place] [address], [place], [place] [address]    Report Status PENDING  Incomplete  Culture, Urine     Status: Abnormal   Collection Time: 02/01/20  2:18 PM   Specimen: Urine, Random  Result Value Ref Range Status   Specimen Description   Final    URINE, RANDOM Performed at [facility] [address], [place], [place] [address]    Special Requests   Final    NONE Performed at [facility], [place] [address] [place], [place] [address]    Culture (A)  Final    50,000 COLONIES/mL KLEBSIELLA PNEUMONIAE Confirmed Extended Spectrum Beta-Lactamase Producer (ESBL).  In bloodstream infections from ESBL organisms, carbapenems are preferred over piperacillin/tazobactam. They are shown to have  a lower risk of mortality.    Report Status 02/03/2020 FINAL  Final   Organism ID, Bacteria KLEBSIELLA PNEUMONIAE (A)  Final      Susceptibility   Klebsiella pneumoniae - MIC*    AMPICILLIN >=32 RESISTANT Resistant     CEFAZOLIN >=64 RESISTANT Resistant     CEFEPIME >=32 RESISTANT Resistant     CEFTRIAXONE >=64 RESISTANT Resistant     CIPROFLOXACIN <=0.25 SENSITIVE Sensitive     GENTAMICIN <=1 SENSITIVE Sensitive     IMIPENEM <=0.25 SENSITIVE Sensitive     NITROFURANTOIN 128 RESISTANT Resistant     TRIMETH/SULFA >=320 RESISTANT Resistant     AMPICILLIN/SULBACTAM >=32 RESISTANT Resistant     PIP/TAZO 8 SENSITIVE Sensitive     * 50,000 COLONIES/mL KLEBSIELLA PNEUMONIAE  SARS Coronavirus 2 by RT PCR (hospital order, performed in [place] hospital lab) Nasopharyngeal Nasopharyngeal Swab     Status: None   Collection Time: 02/02/20 11:55 AM   Specimen: Nasopharyngeal Swab  Result Value Ref Range Status   SARS Coronavirus 2 NEGATIVE NEGATIVE Final    Comment: (NOTE) SARS-CoV-2 target nucleic acids are NOT DETECTED.  The SARS-CoV-2 RNA is generally detectable in upper and lower respiratory specimens during the acute phase of infection. The lowest concentration of SARS-CoV-2 viral copies this assay can detect is 250 copies / mL. A negative result does not preclude SARS-CoV-2 infection and should not be used as the sole basis for treatment or other patient management decisions.  A negative  result may occur with improper specimen collection / handling, submission of specimen other than nasopharyngeal swab, presence of viral mutation(s) within the areas targeted by this assay, and inadequate number of viral copies (<250 copies / mL). A negative result must be combined with clinical observations, patient history, and epidemiological information.  Fact Sheet for Patients:   StrictlyIdeas.no  Fact Sheet for Healthcare  Providers: BankingDealers.co.za  This test is not yet approved or  cleared by the [place] FDA and has been authorized for detection and/or diagnosis of SARS-CoV-2 by FDA under an Emergency Use Authorization (EUA).  This EUA will remain in effect (meaning this test can be used) for the duration of the COVID-19 declaration under Section 564(b)(1) of the Act, 21 U.S.C. section 360bbb-3(b)(1), unless the authorization is terminated or revoked sooner.  Performed at [facility], [place] [address] [place],  [address]     Radiology Reports DG Chest 2 View  Result Date: 01/30/2020 CLINICAL DATA:  Shortness of breath and elevated white blood cell count EXAM: CHEST - 2 VIEW COMPARISON:  12/28/2019 FINDINGS: Cardiomegaly and vascular pedicle widening with aortic tortuosity. There is improved aeration since prior. Congested appearance of vessels without focal pneumonia. No visible effusion or pneumothorax. IMPRESSION: Cardiomegaly and vascular congestion. Aeration is improved since comparison last month. Electronically Signed   By: Monte Fantasia M.D.   On: 01/30/2020 05:12   DG Elbow 2 Views Left  Result Date: 02/03/2020 CLINICAL DATA:  Pain and swelling for 4 days, no known injury EXAM: LEFT ELBOW - 2 VIEW COMPARISON:  None. FINDINGS: There is no evidence of fracture, dislocation, or joint effusion. Moderate arthrosis. Soft tissue edema posteriorly. IMPRESSION: 1. No fracture or dislocation of the left elbow. Moderate arthrosis. 2.  Soft tissue edema posteriorly. Electronically Signed   By: [name] M.D.   On: 02/03/2020 11:39   DG Hand 2 View Left  Result Date: 01/10/2020 CLINICAL DATA:  Left hand pain and swelling EXAM: LEFT HAND - 2 VIEW COMPARISON:  None. FINDINGS: There is no evidence of fracture or dislocation. There is no evidence of arthropathy or other focal bone abnormality. Soft tissues are unremarkable. IMPRESSION: Negative.  Electronically Signed   By: Prudencio Pair M.D.   On: 01/10/2020 15:50   US Venous Img Upper Uni Left (DVT)  Result Date: 01/07/2020 CLINICAL DATA:  Pain left arm x2 weeks EXAM: LEFT UPPER EXTREMITY VENOUS DOPPLER ULTRASOUND TECHNIQUE: Gray-scale sonography with graded compression, as well as color Doppler and duplex ultrasound were performed to evaluate the upper extremity deep venous system from the level of the subclavian vein and including the jugular, axillary, basilic, radial, ulnar and upper cephalic vein. Spectral Doppler was utilized to evaluate flow at rest and with distal augmentation maneuvers. COMPARISON:  None. FINDINGS: Contralateral Subclavian Vein: Respiratory phasicity is normal and symmetric with the symptomatic side. No evidence of thrombus. Normal compressibility. Internal Jugular Vein: No evidence of thrombus. Normal compressibility, respiratory phasicity and response to augmentation. Subclavian Vein: No evidence of thrombus. Normal compressibility, respiratory phasicity and response to augmentation. Axillary Vein: No evidence of thrombus. Normal compressibility, respiratory phasicity and response to augmentation. Cephalic Vein: No evidence of thrombus. Normal compressibility, respiratory phasicity and response to augmentation. Basilic Vein: No evidence of thrombus. Normal compressibility, respiratory phasicity and response to augmentation. Brachial Veins: No evidence of thrombus. Normal compressibility, respiratory phasicity and response to augmentation. Radial Veins: No evidence of thrombus. Normal compressibility, respiratory phasicity and response to augmentation. Ulnar Veins: No evidence  of thrombus. Normal compressibility, respiratory phasicity and response to augmentation. Other Findings:  None visualized. IMPRESSION: No evidence of DVT within the left upper extremity. Electronically Signed   By: Constance Holster M.D.   On: 01/07/2020 18:44   EEG adult  Result Date:  01/30/2020 Lora Havens, MD     01/30/2020  8:55 PM Patient Name: Hector [name] MRN: [id number] Epilepsy Attending: Lora Havens Referring Physician/Provider: Dr Cherylann Ratel Date: 01/30/2020 Duration: 24.26 mins Patient history: 54yo M with upper extremity shaking. EEG to evaluate for seizure Level of alertness: Awake,  Asleep AEDs during EEG study: None Technical aspects: This EEG study was done with scalp electrodes positioned according to the 10-20 International system of electrode placement. Electrical activity was acquired at a sampling rate of 500Hz  and reviewed with a high frequency filter of 70Hz  and a low frequency filter of 1Hz . EEG data were recorded continuously and digitally stored. Description: The posterior dominant rhythm consists of 8 Hz activity of moderate voltage (25-35 uV) seen predominantly in posterior head regions, symmetric and reactive to eye opening and eye closing. Sleep was characterized by vertex waves, sleep spindles (12 to 14 Hz), maximal frontocentral region. Hyperventilation and photic stimulation were not performed.   IMPRESSION: This study is within normal limits. No seizures or epileptiform discharges were seen throughout the recording. Lora Havens   ECHOCARDIOGRAM COMPLETE  Result Date: 01/31/2020    ECHOCARDIOGRAM REPORT   Patient Name:   Hector [name] Date of Exam: 01/31/2020 Medical Rec #:  [id number]           Height:       71.0 in Accession #:    [id number]          Weight:       228.4 lb Date of Birth:  [birth year]           BSA:          2.231 m Patient Age:    58 years            BP:           145/108 mmHg Patient Gender: M                   HR:           108 bpm. Exam Location:  Inpatient Procedure: 2D Echo, Cardiac Doppler, Color Doppler and Intracardiac            Opacification Agent Indications:    CHF-Acute Diastolic 283.66 / Q94.76  History:        Patient has prior history of Echocardiogram examinations, most                 recent  12/03/2016. Cardiomyopathy, Arrythmias:Atrial                 Fibrillation and Atrial Flutter; Risk Factors:Hypertension,                 Dyslipidemia and Former Smoker. Elevated troponin.  Sonographer:    Vickie Epley RDCS Referring Phys: [id number] [facility] D Ifeoluwa Beller  Sonographer Comments: Image acquisition challenging due to respiratory motion. IMPRESSIONS  1. Left ventricular ejection fraction, by estimation, is 45 to 50%. The left ventricle has normal function. The left ventricle demonstrates regional wall motion abnormalities. Abnormal (paradoxical) septal motion, consistent with left bundle branch block. Left ventricular diastolic parameters are indeterminate.  2. Right ventricular systolic function is normal. The right ventricular size is normal. Tricuspid regurgitation  signal is inadequate for assessing PA pressure.  3. The mitral valve is normal in structure. No evidence of mitral valve regurgitation.  4. The aortic valve was not well visualized. Aortic valve regurgitation is not visualized. No aortic stenosis is present.  5. The inferior vena cava is normal in size with greater than 50% respiratory variability, suggesting right atrial pressure of 3 mmHg. FINDINGS  Left Ventricle: Left ventricular ejection fraction, by estimation, is 45 to 50%. The left ventricle has normal function. The left ventricle demonstrates regional wall motion abnormalities. Definity contrast agent was given IV to delineate the left ventricular endocardial borders. The left ventricular internal cavity size was normal in size. There is no left ventricular hypertrophy. Abnormal (paradoxical) septal motion, consistent with left bundle branch block. Left ventricular diastolic parameters  are indeterminate. Right Ventricle: The right ventricular size is normal. Right vetricular wall thickness was not assessed. Right ventricular systolic function is normal. Tricuspid regurgitation signal is inadequate for assessing PA pressure. Left Atrium:  Left atrial size was normal in size. Right Atrium: Right atrial size was normal in size. Pericardium: There is no evidence of pericardial effusion. Mitral Valve: The mitral valve is normal in structure. No evidence of mitral valve regurgitation. Tricuspid Valve: The tricuspid valve is normal in structure. Tricuspid valve regurgitation is not demonstrated. Aortic Valve: The aortic valve was not well visualized. Aortic valve regurgitation is not visualized. No aortic stenosis is present. Pulmonic Valve: The pulmonic valve was not well visualized. Pulmonic valve regurgitation is not visualized. Aorta: The aortic root is normal in size and structure. Venous: The inferior vena cava is normal in size with greater than 50% respiratory variability, suggesting right atrial pressure of 3 mmHg. IAS/Shunts: The interatrial septum was not well visualized.  LEFT VENTRICLE PLAX 2D LVIDd:         4.60 cm      Diastology LVIDs:         3.60 cm      LV e' medial:    5.19 cm/s LV PW:         1.00 cm      LV E/e' medial:  10.8 LV IVS:        1.00 cm      LV e' lateral:   9.24 cm/s LVOT diam:     2.30 cm      LV E/e' lateral: 6.0 LV SV:         73 LV SV Index:   33 LVOT Area:     4.15 cm  LV Volumes (MOD) LV vol d, MOD A2C: 183.0 ml LV vol d, MOD A4C: 159.0 ml LV vol s, MOD A2C: 86.8 ml LV vol s, MOD A4C: 84.2 ml LV SV MOD A2C:     96.2 ml LV SV MOD A4C:     159.0 ml LV SV MOD BP:      83.4 ml RIGHT VENTRICLE RV S prime:     15.30 cm/s TAPSE (M-mode): 1.5 cm LEFT ATRIUM             Index       RIGHT ATRIUM           Index LA diam:        3.40 cm 1.52 cm/m  RA Area:     12.30 cm LA Vol (A2C):   39.2 ml 17.57 ml/m RA Volume:   26.40 ml  11.83 ml/m LA Vol (A4C):   37.7 ml 16.90 ml/m LA Biplane Vol: 42.5 ml  19.05 ml/m  AORTIC VALVE LVOT Vmax:   132.00 cm/s LVOT Vmean:  91.900 cm/s LVOT VTI:    0.175 m MITRAL VALVE MV Area (PHT): 4.49 cm    SHUNTS MV Decel Time: 169 msec    Systemic VTI:  0.18 m MV E velocity: 55.90 cm/s  Systemic  Diam: 2.30 cm MV A velocity: 68.60 cm/s MV E/A ratio:  0.81 Oswaldo Milian MD Electronically signed by Oswaldo Milian MD Signature Date/Time: 01/31/2020/1:13:35 PM    Final      Time Spent in minutes  30     Desiree Hane M.D on 02/03/2020 at 3:13 PM  To page go to [email or web address] - password [facility]

## 2020-02-03 NOTE — Plan of Care (Signed)
  Problem: Education: Goal: Knowledge of General Education information will improve Description: Including pain rating scale, medication(s)/side effects and non-pharmacologic comfort measures Outcome: Progressing   Problem: Clinical Measurements: Goal: Will remain free from infection Outcome: Progressing   Problem: Coping: Goal: Level of anxiety will decrease Outcome: Progressing   Problem: Elimination: Goal: Will not experience complications related to urinary retention Outcome: Progressing   Problem: Pain Managment: Goal: General experience of comfort will improve Outcome: Progressing   Problem: Safety: Goal: Ability to remain free from injury will improve Outcome: Progressing   Problem: Skin Integrity: Goal: Risk for impaired skin integrity will decrease Outcome: Progressing

## 2020-02-04 ENCOUNTER — Inpatient Hospital Stay (HOSPITAL_COMMUNITY): Payer: Medicaid Other

## 2020-02-04 DIAGNOSIS — M25429 Effusion, unspecified elbow: Secondary | ICD-10-CM | POA: Diagnosis present

## 2020-02-04 DIAGNOSIS — M25422 Effusion, left elbow: Secondary | ICD-10-CM

## 2020-02-04 DIAGNOSIS — R0682 Tachypnea, not elsewhere classified: Secondary | ICD-10-CM

## 2020-02-04 LAB — CBC
HCT: 39.9 % (ref 39.0–52.0)
Hemoglobin: 12.7 g/dL — ABNORMAL LOW (ref 13.0–17.0)
MCH: 27.9 pg (ref 26.0–34.0)
MCHC: 31.8 g/dL (ref 30.0–36.0)
MCV: 87.5 fL (ref 80.0–100.0)
Platelets: 251 10*3/uL (ref 150–400)
RBC: 4.56 MIL/uL (ref 4.22–5.81)
RDW: 15.1 % (ref 11.5–15.5)
WBC: 10.4 10*3/uL (ref 4.0–10.5)
nRBC: 0 % (ref 0.0–0.2)

## 2020-02-04 LAB — GLUCOSE, CAPILLARY
Glucose-Capillary: 97 mg/dL (ref 70–99)
Glucose-Capillary: 86 mg/dL (ref 70–99)
Glucose-Capillary: 92 mg/dL (ref 70–99)
Glucose-Capillary: 132 mg/dL — ABNORMAL HIGH (ref 70–99)

## 2020-02-04 LAB — CULTURE, BLOOD (ROUTINE X 2)
Culture: NO GROWTH
Culture: NO GROWTH
Special Requests: ADEQUATE

## 2020-02-04 LAB — D-DIMER, QUANTITATIVE: D-Dimer, Quant: 1.3 ug/mL-FEU — ABNORMAL HIGH (ref 0.00–0.50)

## 2020-02-04 LAB — BASIC METABOLIC PANEL
Anion gap: 15 (ref 5–15)
BUN: 75 mg/dL — ABNORMAL HIGH (ref 6–20)
CO2: 19 mmol/L — ABNORMAL LOW (ref 22–32)
Calcium: 9.5 mg/dL (ref 8.9–10.3)
Chloride: 97 mmol/L — ABNORMAL LOW (ref 98–111)
Creatinine, Ser: 2.81 mg/dL — ABNORMAL HIGH (ref 0.61–1.24)
GFR, Estimated: 26 mL/min — ABNORMAL LOW (ref >60)
Glucose, Bld: 94 mg/dL (ref 70–99)
Potassium: 4.8 mmol/L (ref 3.5–5.1)
Sodium: 131 mmol/L — ABNORMAL LOW (ref 135–145)

## 2020-02-04 LAB — URIC ACID: Uric Acid, Serum: 9.3 mg/dL — ABNORMAL HIGH (ref 3.7–8.6)

## 2020-02-04 IMAGING — MR MR ELBOW*L* W/O CM
1 series · 20 of 32 positions shown · non-contrast
Comparison: Left elbow x-ray [DATE]

CLINICAL DATA: Elbow pain, soft tissue infection

EXAM:
MRI OF THE LEFT ELBOW WITHOUT CONTRAST
TECHNIQUE: Multiplanar, multisequence MR imaging of the elbow was attempted. No
intravenous contrast was administered.

[Series 4: T1 · axial · left · 3.0mm · 0.25mm/px · z∈[-36,+83]mm · 20 of 32 slices shown]
[im 1/32]
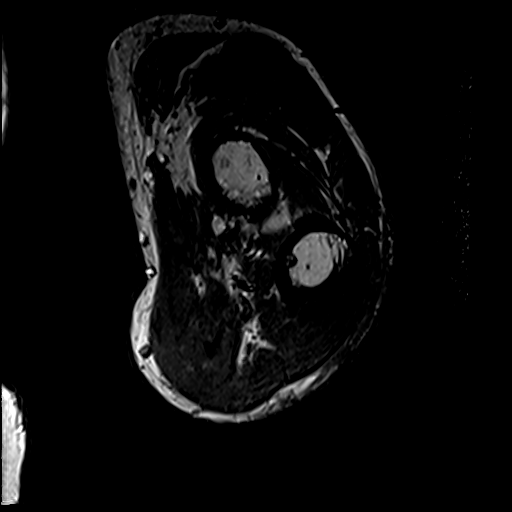
[im 2/32]
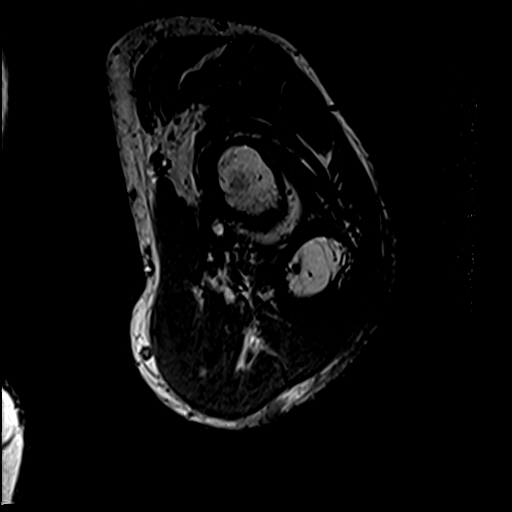
[im 3/32]
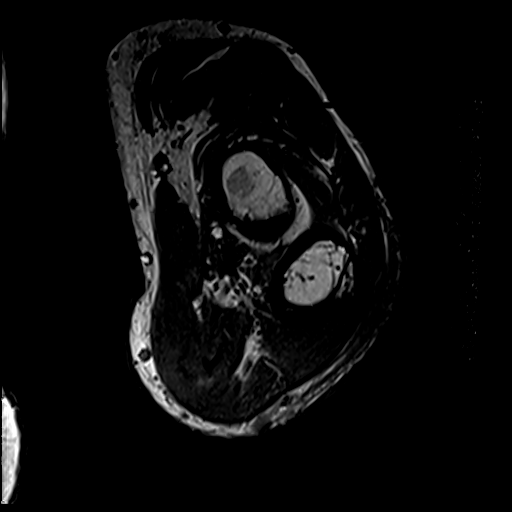
[im 4/32]
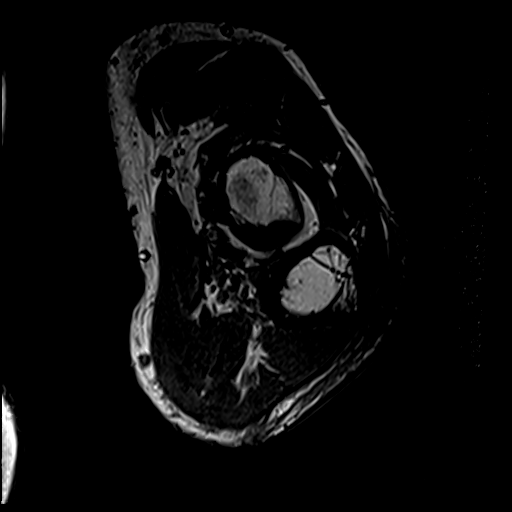
[im 5/32]
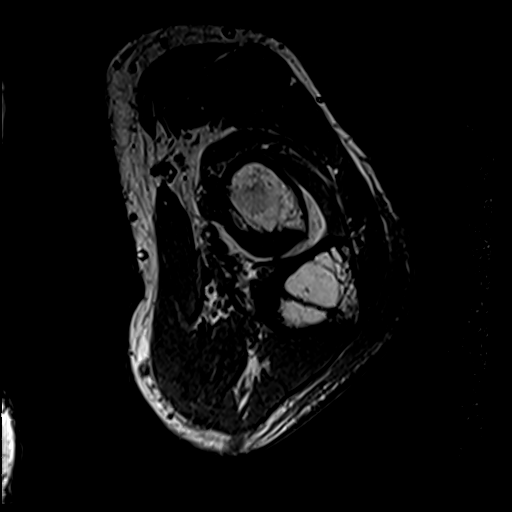
[im 6/32]
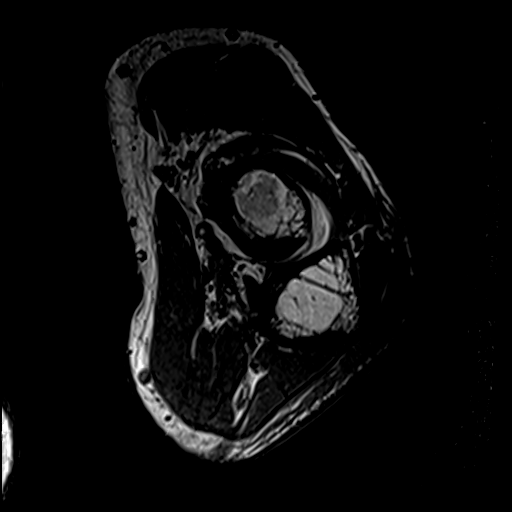
[im 7/32]
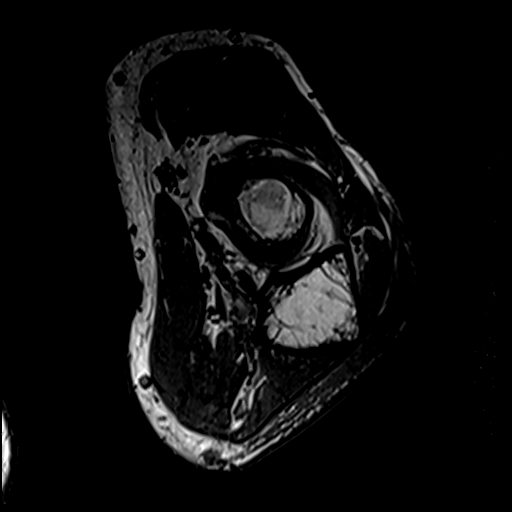
[im 8/32]
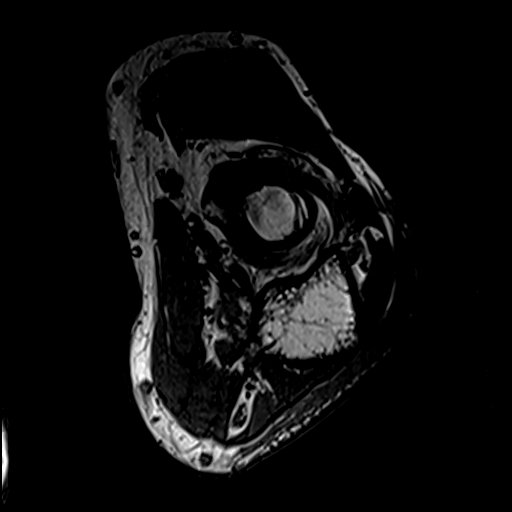
[im 9/32]
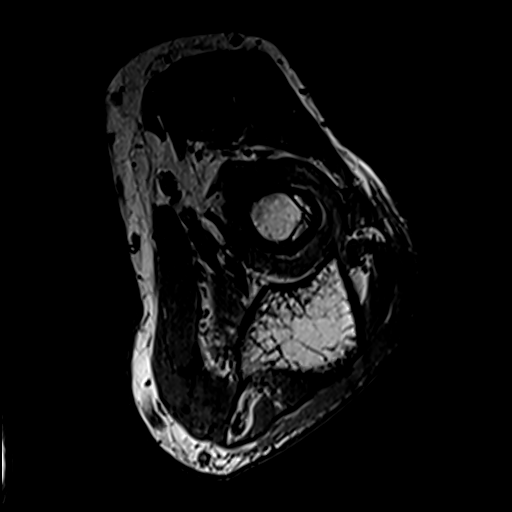
[im 10/32]
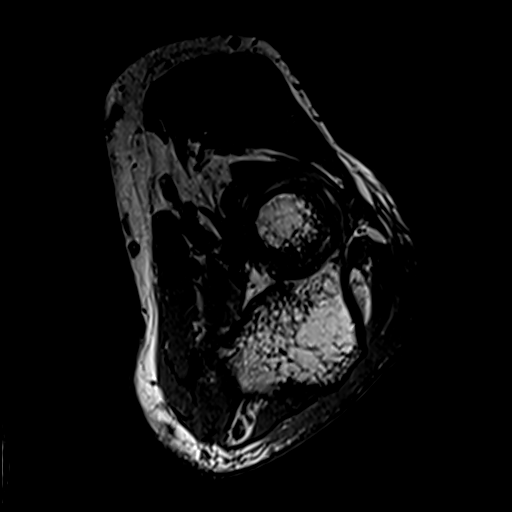
[im 11/32]
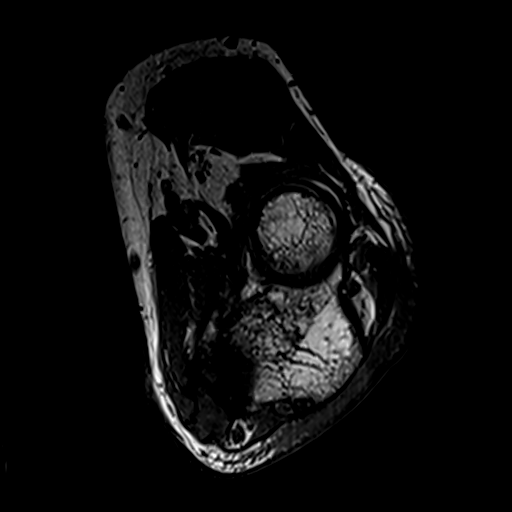
[im 12/32]
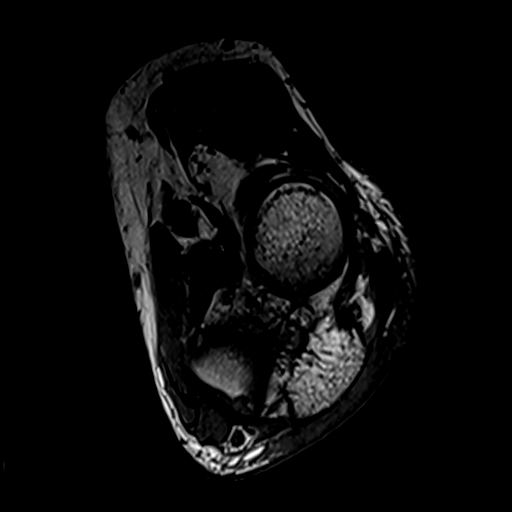
[im 13/32]
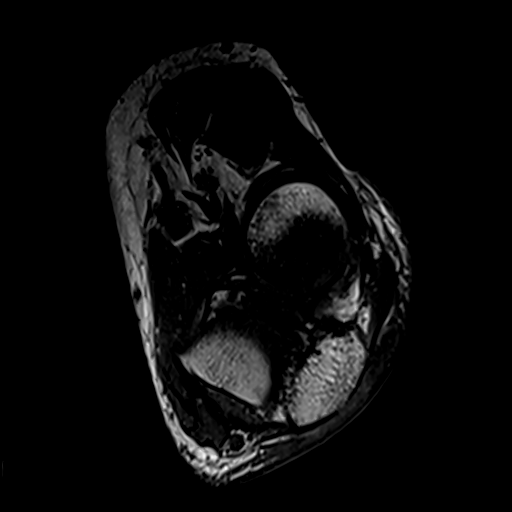
[im 14/32]
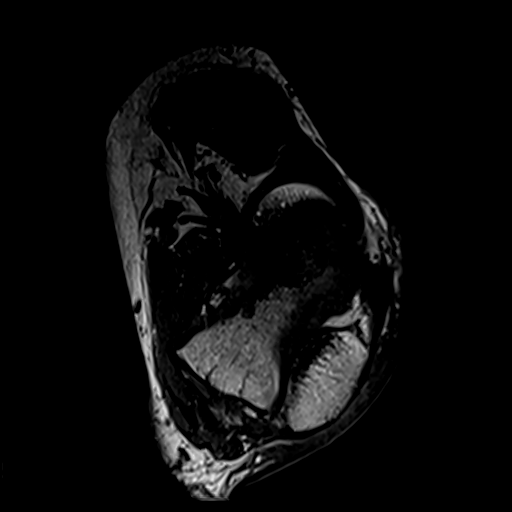
[im 15/32]
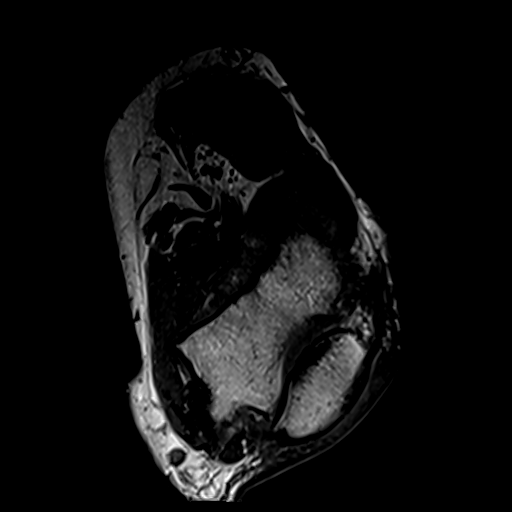
[im 17/32]
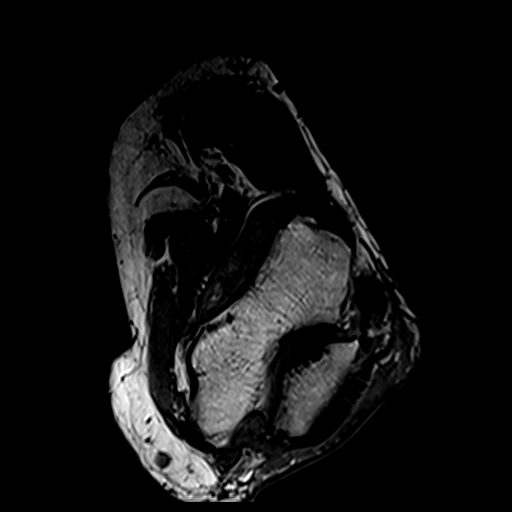
[im 19/32]
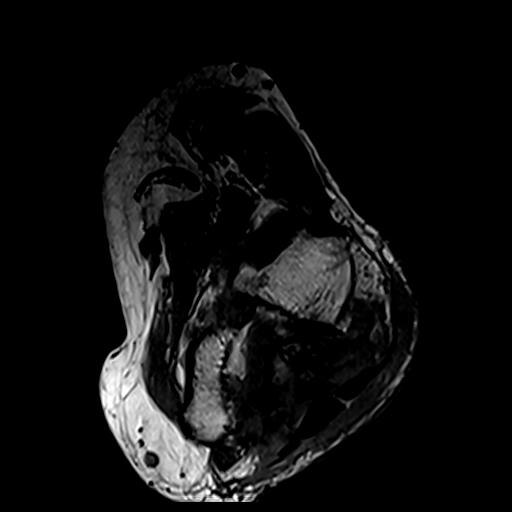
[im 23/32]
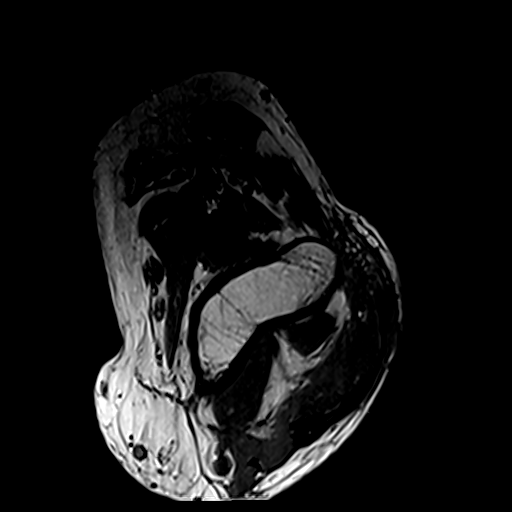
[im 27/32]
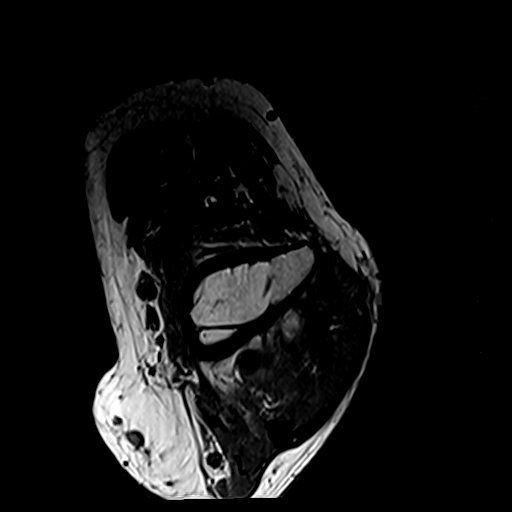
[im 31/32]
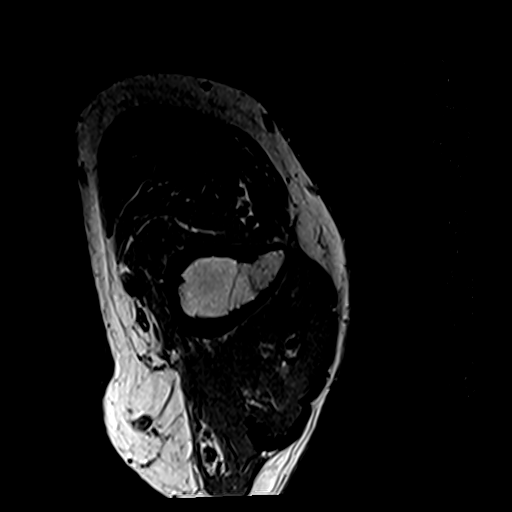

[20 of 32 positions shown; findings below may reference images not displayed]

FINDINGS: Nondiagnostic study as only a single axial T1 weighted sequence was
performed. Patient refused any further imaging.

On the included images there is evidence of subcutaneous soft tissue
edema at the posterior aspect of the elbow and proximal forearm.
There also appears to be an elbow joint effusion. No fluid sensitive
sequences were obtained.
IMPRESSION: 1. Nondiagnostic study as only a single axial T1-weighted sequence
was performed. Patient refused any further imaging.
2. On the included images, there is evidence of subcutaneous soft
tissue edema at the posterior aspect of the elbow and proximal
forearm and suggestion of an elbow joint effusion. If there is
clinical concern for septic arthritis, an arthrocentesis should be
performed.

## 2020-02-04 IMAGING — DX DG CHEST 1V PORT
1 series · 1 of 1 positions shown · non-contrast
Comparison: [DATE] and prior

CLINICAL DATA: Tachypnea, history of AFib.

EXAM:
PORTABLE CHEST 1 VIEW

[chest ap]
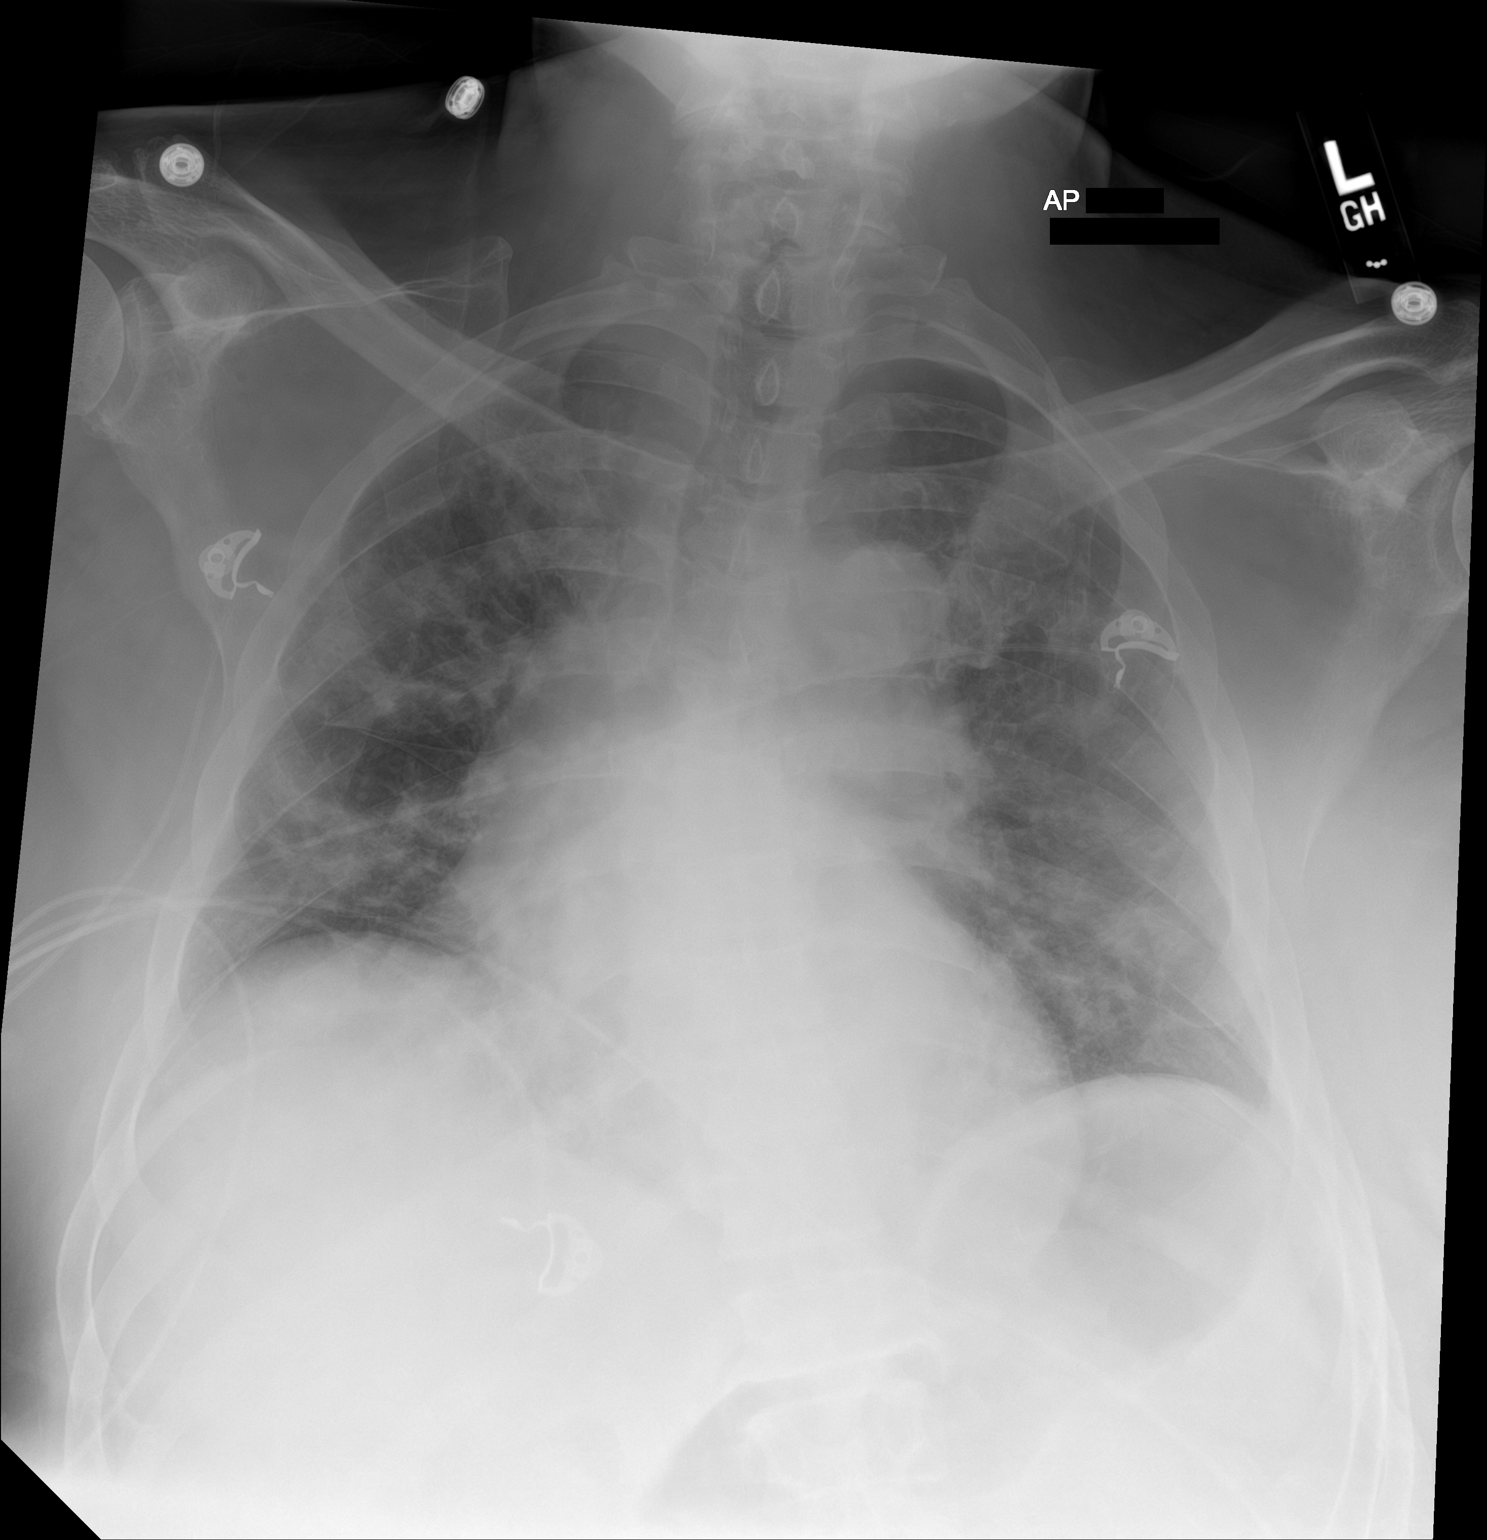

[1 of 1 positions shown; findings below may reference images not displayed]

FINDINGS: No pneumothorax or pleural effusion. Patchy bilateral pulmonary
opacities, unchanged. Cardiomegaly and central pulmonary vascular
congestion similar to prior exam. No acute osseous abnormality.
IMPRESSION: Bilateral pulmonary opacities, unchanged.

Cardiomegaly and central pulmonary vascular congestion.

## 2020-02-04 MED ORDER — HYDRALAZINE HCL 25 MG PO TABS
25.0000 mg | ORAL_TABLET | Freq: Three times a day (TID) | ORAL | Status: DC
  Administered 2020-02-04 – 2020-02-05 (×4): 25 mg via ORAL
  Filled 2020-02-04 (×4): qty 1

## 2020-02-04 MED ORDER — FUROSEMIDE 10 MG/ML IJ SOLN
40.0000 mg | Freq: Two times a day (BID) | INTRAMUSCULAR | Status: DC
  Administered 2020-02-04 – 2020-02-05 (×2): 40 mg via INTRAVENOUS
  Filled 2020-02-04 (×2): qty 4

## 2020-02-04 MED ORDER — PREDNISONE 5 MG PO TABS
30.0000 mg | ORAL_TABLET | Freq: Every day | ORAL | Status: DC
  Administered 2020-02-04 – 2020-02-07 (×4): 30 mg via ORAL
  Filled 2020-02-04 (×4): qty 1

## 2020-02-04 MED ORDER — COLCHICINE 0.6 MG PO TABS
1.2000 mg | ORAL_TABLET | Freq: Once | ORAL | Status: AC
  Administered 2020-02-04: 18:00:00 1.2 mg via ORAL
  Filled 2020-02-04: qty 2

## 2020-02-04 NOTE — Progress Notes (Signed)
[facility]  PROGRESS NOTE  Hector [name] NID:[id number] DOB: 1965-12-27 DOA: 01/30/2020 PCP: [name], MD Admit date - 01/30/2020   Admitting Physician Jonnie Finner, DO  Outpatient Primary MD for the patient is [name], MD  LOS - 5 Brief Narrative   Hector [name] is a 54 y.o. year old male with medical history significant for atrial fibrillation on Eliquis, CHF, diabetes, CKD stage III who presented on 01/30/2020 with upper extremity tremors and was found to have UTI, acute on chronic diastolic CHF, and poorly controlled hypertension  Subjective  Still having 7/10 elbow pain.   A & P   Left elbow pain, with some swelling. Patient reports history of gout. MRI shows soft tissue edema concerning for joint effusion. DDx includes infectious bursitis/septic arthritis, non-infectious arthritis/gout flare. Remains afebrile, no leukocytosis and hemodynamically stable, it is warm, tender, with some overlying erythema. X-ray shows no  fracture, moderate arthrosis with soft tissue edema. Was previously on ceftriaxone for presumed UTI on admission -Supportive care with cool compresses, analgesics --unclear if infectious vs noninfection given hemodynamically stable and no sepsis physiology will trial colchicine 1.2 mg x1 and prednisone 30 mg daily and monitor response.  -- If becomes febrile or acutely decompensates overnight low threshold to discontinue steroids and add empiric antibiotics --Discussed with ortho who will evaluate response and decide if may warrant arthrocentesis to rule out septic arthritis/infectious bursitis vs gout flare  Evidence of hyperthyroidism in a patient with history of hypothyroidism. Suspect exogenous Synthroid is likely culprit. T3, free 16, free T4 4. Cardiology does not think amiodarone has induced this toxicity. Patient has low-grade tachycardia but otherwise no overt symptoms of hyperthyroidism. Receptor antibodies  negative -Thyroid-stimulating immunoglobulins pending -Synthroid was initially discontinued, will restart at low dose -Will need repeat TSH, T3, T4 as outpatient - to monitor on telemetry  Acute on chronic diastolic CHF exacerbation, worsening.  TTE shows preserved EF with indeterminate diastolic function on admission, repeat CXR on 12/12 with vascular congestion, patient is tachypneic but still on room air. Consistent with exacerbation.  Patient not expressing any shortness of breath.Likely nidus poorly controlled blood pressure given poorly controlled diastolic blood pressure.  Seemed to respond well to IV Lasix earlier in hospital stay -Discontinue oral Lasix and start IV lasix 40 mg BID -Monitor volume status -Daily weights, strict I's and O's -Closely monitor blood pressure -Continue Coreg, discontinue further use of Entresto as recommended by cardiology  Hypertension, not at goal still. DBP 110 -Continue amlodipine 10 mg,Coreg to 12.5 mg twice daily per cardiology,  --Start hydralazine 25 mg TID -Entresto discontinued given renal insufficiency -IV hydralazine as needed  Reported tremors, resolved. 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  Initially suspected it could be  related to elevated BUN but this seems chronic in etiology and currently CKD is stable. EEG unremarkable -Discontinued lactulose --Continue to monitor  AKI on CKD stage 3, back to baseline.  Baseline creatinine prior to admission in November 2.2-2.4.  Creatinine of 2.9 on admission likely related to CHF exacerbation and poorly controlled blood pressure. Presented with hyperkalemia that's now resolved --Continue to hold home sacubitril/valsartan -Avoid further nephrotoxins -Monitor output -Monitor BMP  UTI secondary to Klebsiella pneumonia, resolved Denies any urinary symptoms. Urine sensitivity shows resistance to ceftriaxone. Has been on ceftriaxone throughout  hospital stay without any fevers or complaints of urinary symptoms. Suspect poor sample, given random -Discontinue IV ceftriaxone  Type 2 diabetes,  well controlled A1c 7.7 (11/2019) -Sliding scale insulin, monitor CBG  Atrial fibrillation, currently rate controlled -monitor on telemetry -Continue amiodarone as recommended by cardiology and Eliquis  HLD, stable -continue statin  Left hand pain and history of nerve impingement during previous hospitalization back in November.  Patient states Voltaren gel does not help -Started low-dose gabapentin 100 mg 3 times daily, closely monitor  History of gout. Concern could be potential flare of elbow.  Unclear if still taking allopurinol --colchicine x1 and assess resonse --check uric acid   Family Communication  :  none  Code Status :  FULL  Disposition Plan  :  Patient is from home. Anticipated d/c date:  1-2 days. Barriers to d/c or necessity for inpatient status: IV lasix for CHF flare, colchicine for gout flare, will discuss with ortho Consults  : Cardiology  Procedures  :  TTE, 01/31/20  DVT Prophylaxis  :  eliquis  MDM: The below labs and imaging reports were reviewed and summarized above.  Medication management as above.  Lab Results  Component Value Date   PLT 251 02/04/2020    Diet :  Diet Order            Diet heart healthy/carb modified Room service appropriate? No; Fluid consistency: Thin  Diet effective now                  Inpatient Medications Scheduled Meds: . amiodarone  200 mg Oral Daily  . amLODipine  10 mg Oral Daily  . apixaban  5 mg Oral BID  . atorvastatin  80 mg Oral Daily  . carvedilol  6.25 mg Oral BID WC  . colchicine  1.2 mg Oral Once  . furosemide  40 mg Intravenous BID  . gabapentin  100 mg Oral TID  . hydrALAZINE  25 mg Oral Q8H  . insulin aspart  0-15 Units Subcutaneous TID WC  . insulin aspart  0-5 Units Subcutaneous QHS  . levothyroxine  50 mcg Oral Q0600  . predniSONE  30 mg  Oral Q breakfast   Continuous Infusions:  PRN Meds:.acetaminophen **OR** acetaminophen, hydrALAZINE, Muscle Rub  Antibiotics  :   Anti-infectives (From admission, onward)   Start     Dose/Rate Route Frequency Ordered Stop   01/31/20 0800  cefTRIAXone (ROCEPHIN) 1 g in sodium chloride 0.9 % 100 mL IVPB  Status:  Discontinued        1 g 200 mL/hr over 30 Minutes Intravenous Every 24 hours 01/30/20 1517 02/03/20 1245   01/30/20 0530  cefTRIAXone (ROCEPHIN) 1 g in sodium chloride 0.9 % 100 mL IVPB        1 g 200 mL/hr over 30 Minutes Intravenous  Once 01/30/20 0515 01/30/20 0811   01/30/20 0500  cefTRIAXone (ROCEPHIN) 1 g in sodium chloride 0.9 % 100 mL IVPB        1 g 200 mL/hr over 30 Minutes Intravenous  Once 01/30/20 0453 01/30/20 0621       Objective   Vitals:   02/04/20 0500 02/04/20 0848 02/04/20 1114 02/04/20 1655  BP:  (!) 141/101 129/90 131/88  Pulse:  99 96 94  Resp:  (!) 25 (!) 24 20  Temp:  98.2 F (36.8 C) 98.9 F (37.2 C) 98.5 F (36.9 C)  TempSrc:  Oral Oral Oral  SpO2:  93% 91% 92%  Weight: 103.9 kg     Height:        SpO2: 92 %  Wt Readings from Last 3 Encounters:  02/04/20 103.9 kg  01/23/20 124.3 kg  01/12/20 117.7 kg     Intake/Output Summary (Last 24 hours) at 02/04/2020 1703 Last data filed at 02/04/2020 0950 Gross per 24 hour  Intake 120 ml  Output 1050 ml  Net -930 ml    Physical Exam:   Awake Alert, Oriented X 3, Normal affect No proptosis, no thyromegaly/goiter appreciated No new F.N deficits,  [place].AT, Increased tachypnea on room air, crackles at bases, no conversational dyspnea Irregularly irregular, normal rate, no peripheral edema +ve B.Sounds, Abd Soft, No tenderness, No rebound, guarding or rigidity. No Cyanosis, No new Rash or bruise  Left hand grip decreased Left elbow  warm to touch, with slight increase swelling in comparison to right on posterior of elbow and erythema   I have personally reviewed the following:    Data Reviewed:  CBC Recent Labs  Lab 01/30/20 0210 01/30/20 0701 01/31/20 1002 02/01/20 0519 02/04/20 0537  WBC 17.7*  --  10.6* 9.6 10.4  HGB 14.6 14.3 12.1* 13.1 12.7*  HCT 47.3 42.0 37.8* 40.6 39.9  PLT 315  --  247 255 251  MCV 91.5  --  89.4 87.5 87.5  MCH 28.2  --  28.6 28.2 27.9  MCHC 30.9  --  32.0 32.3 31.8  RDW 15.3  --  15.3 15.2 15.1  LYMPHSABS 2.9  --   --   --   --   MONOABS 1.4*  --   --   --   --   EOSABS 0.1  --   --   --   --   BASOSABS 0.1  --   --   --   --     Chemistries  Recent Labs  Lab 01/30/20 1038 01/31/20 1002 02/01/20 0519 02/02/20 0727 02/03/20 0823 02/04/20 0533  NA  --  134* 135 133* 132* 131*  K  --  4.3 4.3 5.1 4.6 4.8  CL  --  103 101 100 98 97*  CO2  --  16* 22 18* 22 19*  GLUCOSE  --  135* 97 88 110* 94  BUN  --  60* 58* 64* 71* 75*  CREATININE  --  2.51* 2.21* 2.32* 2.37* 2.81*  CALCIUM  --  8.9 9.4 9.3 9.6 9.5  AST 40 34  --   --   --   --   ALT 45* 41  --   --   --   --   ALKPHOS 100 84  --   --   --   --   BILITOT 1.5* 1.2  --   --   --   --    ------------------------------------------------------------------------------------------------------------------ Recent Labs    02/03/20 0602  CHOL 89  HDL 15*  LDLCALC 49  TRIG 125  CHOLHDL 5.9    Lab Results  Component Value Date   HGBA1C 7.7 (H) 12/23/2019   ------------------------------------------------------------------------------------------------------------------ No results for input(s): TSH, T4TOTAL, T3FREE, THYROIDAB in the last 72 hours.  Invalid input(s): FREET3 ------------------------------------------------------------------------------------------------------------------ No results for input(s): VITAMINB12, FOLATE, FERRITIN, TIBC, IRON, RETICCTPCT in the last 72 hours.  Coagulation profile No results for input(s): INR, PROTIME in the last 168 hours.  Recent Labs    02/04/20 1255  DDIMER 1.30*    Cardiac Enzymes No results for  input(s): CKMB, TROPONINI, MYOGLOBIN in the last 168 hours.  Invalid input(s): CK ------------------------------------------------------------------------------------------------------------------    Component Value Date/Time   BNP 74.1 01/30/2020 1038    Micro Results Recent Results (from the past 240  hour(s))  Urine culture     Status: Abnormal   Collection Time: 01/30/20  3:26 AM   Specimen: Urine, Random  Result Value Ref Range Status   Specimen Description   Final    URINE, RANDOM Performed at [facility] [address], [place], [place] [address]    Special Requests   Final    NONE Performed at [facility], [place] [address] [place], [place] [address]    Culture MULTIPLE SPECIES PRESENT, SUGGEST RECOLLECTION (A)  Final   Report Status 01/31/2020 FINAL  Final  Blood culture (routine x 2)     Status: None   Collection Time: 01/30/20  4:45 AM   Specimen: BLOOD RIGHT HAND  Result Value Ref Range Status   Specimen Description   Final    BLOOD RIGHT HAND Performed at [facility] [address], [place], [place] [address]    Special Requests   Final    BOTTLES DRAWN AEROBIC AND ANAEROBIC Blood Culture results may not be optimal due to an inadequate volume of blood received in culture bottles Performed at [facility] [address] [place], [place] [address]    Culture   Final    NO GROWTH 5 DAYS Performed at [facility], [place] [address], [place], [place] [address]    Report Status 02/04/2020 FINAL  Final  Blood culture (routine x 2)     Status: None   Collection Time: 01/30/20 10:38 AM   Specimen: BLOOD  Result Value Ref Range Status   Specimen Description   Final    BLOOD BLOOD LEFT FOREARM Performed at [facility] [address], [place], [place] [address]    Special Requests   Final    BOTTLES DRAWN AEROBIC AND ANAEROBIC Blood Culture adequate  volume Performed at [facility] [address], [place], [place] [address]    Culture   Final    NO GROWTH 5 DAYS Performed at [facility], [place] [address] [place], [place] [address]    Report Status 02/04/2020 FINAL  Final  Culture, Urine     Status: Abnormal   Collection Time: 02/01/20  2:18 PM   Specimen: Urine, Random  Result Value Ref Range Status   Specimen Description   Final    URINE, RANDOM Performed at [facility] [address] [place], [place] [address]    Special Requests   Final    NONE Performed at [facility], [place] [address] [place], [place] [address]    Culture (A)  Final    50,000 COLONIES/mL KLEBSIELLA PNEUMONIAE Confirmed Extended Spectrum Beta-Lactamase Producer (ESBL).  In bloodstream infections from ESBL organisms, carbapenems are preferred over piperacillin/tazobactam. They are shown to have a lower risk of mortality.    Report Status 02/03/2020 FINAL  Final   Organism ID, Bacteria KLEBSIELLA PNEUMONIAE (A)  Final      Susceptibility   Klebsiella pneumoniae - MIC*    AMPICILLIN >=32 RESISTANT Resistant     CEFAZOLIN >=64 RESISTANT Resistant     CEFEPIME >=32 RESISTANT Resistant     CEFTRIAXONE >=64 RESISTANT Resistant     CIPROFLOXACIN <=0.25 SENSITIVE Sensitive     GENTAMICIN <=1 SENSITIVE Sensitive     IMIPENEM <=0.25 SENSITIVE Sensitive     NITROFURANTOIN 128 RESISTANT Resistant     TRIMETH/SULFA >=320 RESISTANT Resistant     AMPICILLIN/SULBACTAM >=32 RESISTANT Resistant  PIP/TAZO 8 SENSITIVE Sensitive     * 50,000 COLONIES/mL KLEBSIELLA PNEUMONIAE  SARS Coronavirus 2 by RT PCR (hospital order, performed in [place] hospital lab) Nasopharyngeal Nasopharyngeal Swab     Status: None   Collection Time: 02/02/20 11:55 AM   Specimen: Nasopharyngeal Swab  Result Value Ref Range Status   SARS Coronavirus 2 NEGATIVE NEGATIVE Final    Comment: (NOTE) SARS-CoV-2 target nucleic  acids are NOT DETECTED.  The SARS-CoV-2 RNA is generally detectable in upper and lower respiratory specimens during the acute phase of infection. The lowest concentration of SARS-CoV-2 viral copies this assay can detect is 250 copies / mL. A negative result does not preclude SARS-CoV-2 infection and should not be used as the sole basis for treatment or other patient management decisions.  A negative result may occur with improper specimen collection / handling, submission of specimen other than nasopharyngeal swab, presence of viral mutation(s) within the areas targeted by this assay, and inadequate number of viral copies (<250 copies / mL). A negative result must be combined with clinical observations, patient history, and epidemiological information.  Fact Sheet for Patients:   StrictlyIdeas.no  Fact Sheet for Healthcare Providers: BankingDealers.co.za  This test is not yet approved or  cleared by the [place] FDA and has been authorized for detection and/or diagnosis of SARS-CoV-2 by FDA under an Emergency Use Authorization (EUA).  This EUA will remain in effect (meaning this test can be used) for the duration of the COVID-19 declaration under Section 564(b)(1) of the Act, 21 U.S.C. section 360bbb-3(b)(1), unless the authorization is terminated or revoked sooner.  Performed at [facility], [place] [address] [place], [place] [address]     Radiology Reports DG Chest 2 View  Result Date: 01/30/2020 CLINICAL DATA:  Shortness of breath and elevated white blood cell count EXAM: CHEST - 2 VIEW COMPARISON:  12/28/2019 FINDINGS: Cardiomegaly and vascular pedicle widening with aortic tortuosity. There is improved aeration since prior. Congested appearance of vessels without focal pneumonia. No visible effusion or pneumothorax. IMPRESSION: Cardiomegaly and vascular congestion. Aeration is improved since comparison last  month. Electronically Signed   By: Monte Fantasia M.D.   On: 01/30/2020 05:12   DG Elbow 2 Views Left  Result Date: 02/03/2020 CLINICAL DATA:  Pain and swelling for 4 days, no known injury EXAM: LEFT ELBOW - 2 VIEW COMPARISON:  None. FINDINGS: There is no evidence of fracture, dislocation, or joint effusion. Moderate arthrosis. Soft tissue edema posteriorly. IMPRESSION: 1. No fracture or dislocation of the left elbow. Moderate arthrosis. 2.  Soft tissue edema posteriorly. Electronically Signed   By: [name] M.D.   On: 02/03/2020 11:39   DG Hand 2 View Left  Result Date: 01/10/2020 CLINICAL DATA:  Left hand pain and swelling EXAM: LEFT HAND - 2 VIEW COMPARISON:  None. FINDINGS: There is no evidence of fracture or dislocation. There is no evidence of arthropathy or other focal bone abnormality. Soft tissues are unremarkable. IMPRESSION: Negative. Electronically Signed   By: Prudencio Pair M.D.   On: 01/10/2020 15:50   MR ELBOW LEFT WO CONTRAST  Result Date: 02/04/2020 CLINICAL DATA:  Elbow pain, soft tissue infection EXAM: MRI OF THE LEFT ELBOW WITHOUT CONTRAST TECHNIQUE: Multiplanar, multisequence MR imaging of the elbow was attempted. No intravenous contrast was administered. COMPARISON:  Left elbow x-ray 02/03/2020 FINDINGS: Nondiagnostic study as only a single axial T1 weighted sequence was performed. Patient refused any further imaging. On the included images there is evidence of  subcutaneous soft tissue edema at the posterior aspect of the elbow and proximal forearm. There also appears to be an elbow joint effusion. No fluid sensitive sequences were obtained. IMPRESSION: 1. Nondiagnostic study as only a single axial T1-weighted sequence was performed. Patient refused any further imaging. 2. On the included images, there is evidence of subcutaneous soft tissue edema at the posterior aspect of the elbow and proximal forearm and suggestion of an elbow joint effusion. If there is clinical concern  for septic arthritis, an arthrocentesis should be performed. Electronically Signed   By: Davina Poke D.O.   On: 02/04/2020 14:17   US Venous Img Upper Uni Left (DVT)  Result Date: 01/07/2020 CLINICAL DATA:  Pain left arm x2 weeks EXAM: LEFT UPPER EXTREMITY VENOUS DOPPLER ULTRASOUND TECHNIQUE: Gray-scale sonography with graded compression, as well as color Doppler and duplex ultrasound were performed to evaluate the upper extremity deep venous system from the level of the subclavian vein and including the jugular, axillary, basilic, radial, ulnar and upper cephalic vein. Spectral Doppler was utilized to evaluate flow at rest and with distal augmentation maneuvers. COMPARISON:  None. FINDINGS: Contralateral Subclavian Vein: Respiratory phasicity is normal and symmetric with the symptomatic side. No evidence of thrombus. Normal compressibility. Internal Jugular Vein: No evidence of thrombus. Normal compressibility, respiratory phasicity and response to augmentation. Subclavian Vein: No evidence of thrombus. Normal compressibility, respiratory phasicity and response to augmentation. Axillary Vein: No evidence of thrombus. Normal compressibility, respiratory phasicity and response to augmentation. Cephalic Vein: No evidence of thrombus. Normal compressibility, respiratory phasicity and response to augmentation. Basilic Vein: No evidence of thrombus. Normal compressibility, respiratory phasicity and response to augmentation. Brachial Veins: No evidence of thrombus. Normal compressibility, respiratory phasicity and response to augmentation. Radial Veins: No evidence of thrombus. Normal compressibility, respiratory phasicity and response to augmentation. Ulnar Veins: No evidence of thrombus. Normal compressibility, respiratory phasicity and response to augmentation. Other Findings:  None visualized. IMPRESSION: No evidence of DVT within the left upper extremity. Electronically Signed   By: Constance Holster M.D.    On: 01/07/2020 18:44   DG CHEST PORT 1 VIEW  Result Date: 02/04/2020 CLINICAL DATA:  Tachypnea, history of AFib. EXAM: PORTABLE CHEST 1 VIEW COMPARISON:  01/30/2020 and prior FINDINGS: No pneumothorax or pleural effusion. Patchy bilateral pulmonary opacities, unchanged. Cardiomegaly and central pulmonary vascular congestion similar to prior exam. No acute osseous abnormality. IMPRESSION: Bilateral pulmonary opacities, unchanged. Cardiomegaly and central pulmonary vascular congestion. Electronically Signed   By: Primitivo Gauze M.D.   On: 02/04/2020 13:19   EEG adult  Result Date: 01/30/2020 Lora Havens, MD     01/30/2020  8:55 PM Patient Name: ASHAN CUEVA MRN: [id number] Epilepsy Attending: Lora Havens Referring Physician/Provider: Dr Cherylann Ratel Date: 01/30/2020 Duration: 24.26 mins Patient history: 54yo M with upper extremity shaking. EEG to evaluate for seizure Level of alertness: Awake,  Asleep AEDs during EEG study: None Technical aspects: This EEG study was done with scalp electrodes positioned according to the 10-20 International system of electrode placement. Electrical activity was acquired at a sampling rate of 500Hz  and reviewed with a high frequency filter of 70Hz  and a low frequency filter of 1Hz . EEG data were recorded continuously and digitally stored. Description: The posterior dominant rhythm consists of 8 Hz activity of moderate voltage (25-35 uV) seen predominantly in posterior head regions, symmetric and reactive to eye opening and eye closing. Sleep was characterized by vertex waves, sleep spindles (12 to 14 Hz), maximal frontocentral region. Hyperventilation  and photic stimulation were not performed.   IMPRESSION: This study is within normal limits. No seizures or epileptiform discharges were seen throughout the recording. Lora Havens   ECHOCARDIOGRAM COMPLETE  Result Date: 01/31/2020    ECHOCARDIOGRAM REPORT   Patient Name:   CALDWELL KRONENBERGER Date of  Exam: 01/31/2020 Medical Rec #:  [id number]           Height:       71.0 in Accession #:    [id number]          Weight:       228.4 lb Date of Birth:  [birth year]           BSA:          2.231 m Patient Age:    12 years            BP:           145/108 mmHg Patient Gender: M                   HR:           108 bpm. Exam Location:  Inpatient Procedure: 2D Echo, Cardiac Doppler, Color Doppler and Intracardiac            Opacification Agent Indications:    CHF-Acute Diastolic 967.89 / F81.01  History:        Patient has prior history of Echocardiogram examinations, most                 recent 12/03/2016. Cardiomyopathy, Arrythmias:Atrial                 Fibrillation and Atrial Flutter; Risk Factors:Hypertension,                 Dyslipidemia and Former Smoker. Elevated troponin.  Sonographer:    Vickie Epley RDCS Referring Phys: [id number] [facility] [place]  Sonographer Comments: Image acquisition challenging due to respiratory motion. IMPRESSIONS  1. Left ventricular ejection fraction, by estimation, is 45 to 50%. The left ventricle has normal function. The left ventricle demonstrates regional wall motion abnormalities. Abnormal (paradoxical) septal motion, consistent with left bundle branch block. Left ventricular diastolic parameters are indeterminate.  2. Right ventricular systolic function is normal. The right ventricular size is normal. Tricuspid regurgitation signal is inadequate for assessing PA pressure.  3. The mitral valve is normal in structure. No evidence of mitral valve regurgitation.  4. The aortic valve was not well visualized. Aortic valve regurgitation is not visualized. No aortic stenosis is present.  5. The inferior vena cava is normal in size with greater than 50% respiratory variability, suggesting right atrial pressure of 3 mmHg. FINDINGS  Left Ventricle: Left ventricular ejection fraction, by estimation, is 45 to 50%. The left ventricle has normal function. The left ventricle demonstrates regional wall  motion abnormalities. Definity contrast agent was given IV to delineate the left ventricular endocardial borders. The left ventricular internal cavity size was normal in size. There is no left ventricular hypertrophy. Abnormal (paradoxical) septal motion, consistent with left bundle branch block. Left ventricular diastolic parameters  are indeterminate. Right Ventricle: The right ventricular size is normal. Right vetricular wall thickness was not assessed. Right ventricular systolic function is normal. Tricuspid regurgitation signal is inadequate for assessing PA pressure. Left Atrium: Left atrial size was normal in size. Right Atrium: Right atrial size was normal in size. Pericardium: There is no evidence of pericardial effusion. Mitral Valve: The mitral valve is normal in structure. No  evidence of mitral valve regurgitation. Tricuspid Valve: The tricuspid valve is normal in structure. Tricuspid valve regurgitation is not demonstrated. Aortic Valve: The aortic valve was not well visualized. Aortic valve regurgitation is not visualized. No aortic stenosis is present. Pulmonic Valve: The pulmonic valve was not well visualized. Pulmonic valve regurgitation is not visualized. Aorta: The aortic root is normal in size and structure. Venous: The inferior vena cava is normal in size with greater than 50% respiratory variability, suggesting right atrial pressure of 3 mmHg. IAS/Shunts: The interatrial septum was not well visualized.  LEFT VENTRICLE PLAX 2D LVIDd:         4.60 cm      Diastology LVIDs:         3.60 cm      LV e' medial:    5.19 cm/s LV PW:         1.00 cm      LV E/e' medial:  10.8 LV IVS:        1.00 cm      LV e' lateral:   9.24 cm/s LVOT diam:     2.30 cm      LV E/e' lateral: 6.0 LV SV:         73 LV SV Index:   33 LVOT Area:     4.15 cm  LV Volumes (MOD) LV vol d, MOD A2C: 183.0 ml LV vol d, MOD A4C: 159.0 ml LV vol s, MOD A2C: 86.8 ml LV vol s, MOD A4C: 84.2 ml LV SV MOD A2C:     96.2 ml LV SV MOD A4C:      159.0 ml LV SV MOD BP:      83.4 ml RIGHT VENTRICLE RV S prime:     15.30 cm/s TAPSE (M-mode): 1.5 cm LEFT ATRIUM             Index       RIGHT ATRIUM           Index LA diam:        3.40 cm 1.52 cm/m  RA Area:     12.30 cm LA Vol (A2C):   39.2 ml 17.57 ml/m RA Volume:   26.40 ml  11.83 ml/m LA Vol (A4C):   37.7 ml 16.90 ml/m LA Biplane Vol: 42.5 ml 19.05 ml/m  AORTIC VALVE LVOT Vmax:   132.00 cm/s LVOT Vmean:  91.900 cm/s LVOT VTI:    0.175 m MITRAL VALVE MV Area (PHT): 4.49 cm    SHUNTS MV Decel Time: 169 msec    Systemic VTI:  0.18 m MV E velocity: 55.90 cm/s  Systemic Diam: 2.30 cm MV A velocity: 68.60 cm/s MV E/A ratio:  0.81 Oswaldo Milian MD Electronically signed by Oswaldo Milian MD Signature Date/Time: 01/31/2020/1:13:35 PM    Final      Time Spent in minutes  30     Desiree Hane M.D on 02/04/2020 at 5:03 PM  To page go to [email or web address] - password [email or web address]

## 2020-02-04 NOTE — Progress Notes (Signed)
[facility]  PROGRESS NOTE  Hector [name] ZOX:[id number] DOB: 1965-06-05 DOA: 01/30/2020 PCP: [name], MD Admit date - 01/30/2020   Admitting Physician Jonnie Finner, DO  Outpatient Primary MD for the patient is [name], MD  LOS - 5 Brief Narrative   Hector [name] is a 54 y.o. year old male with medical history significant for atrial fibrillation on Eliquis, CHF, diabetes, CKD stage III who presented on 01/30/2020 with upper extremity tremors and was found to have UTI, acute on chronic diastolic CHF, and poorly controlled hypertension  Subjective  Still having 7/10 elbow pain.   A & P   Left elbow pain, with some swelling. Patient reports history of gout. MRI shows soft tissue edema concerning for joint effusion. DDx includes infectious bursitis/septic arthritis, non-infectious arthritis/gout flare. Remains afebrile, no leukocytosis and hemodynamically stable, it is warm, tender, with some overlying erythema. X-ray shows no  fracture, moderate arthrosis with soft tissue edema. Was previously on ceftriaxone for presumed UTI on admission -Supportive care with cool compresses, analgesics --Consult ortho may warrant arthrocentesis to rule out septic arthritis/infectious bursitis vs gout flare --unclear if infectious vs noninfection given hemodynamically stable and no sepsis physiology will trial colchicine 1.2 mg x1 and monitor response. Avoid steroids while ruling out infection -- If becomes febrile or acutely decompensates overnight can add empiric antibiotics  Evidence of hyperthyroidism in a patient with history of hypothyroidism. Suspect exogenous Synthroid is likely culprit. T3, free 16, free T4 4. Cardiology does not think amiodarone has induced this toxicity. Patient has low-grade tachycardia but otherwise no overt symptoms of hyperthyroidism. Receptor antibodies negative -Thyroid-stimulating immunoglobulins pending -Synthroid was initially  discontinued, will restart at low dose -Will need repeat TSH, T3, T4 as outpatient - to monitor on telemetry  Acute on chronic diastolic CHF exacerbation, worsening.  TTE shows preserved EF with indeterminate diastolic function on admission, repeat CXR on 12/12 with vascular congestion, patient is tachypneic but still on room air. Consistent with exacerbation.  Patient not expressing any shortness of breath.Likely nidus poorly controlled blood pressure given poorly controlled diastolic blood pressure.  Seemed to respond well to IV Lasix earlier in hospital stay -Discontinue oral Lasix and start IV lasix 40 mg BID -Monitor volume status -Daily weights, strict I's and O's -Closely monitor blood pressure -Continue Coreg, discontinue further use of Entresto as recommended by cardiology  Hypertension, not at goal still. DBP 110 -Continue amlodipine 10 mg,Coreg to 12.5 mg twice daily per cardiology,  --Start hydralazine 25 mg TID -Entresto discontinued given renal insufficiency -IV hydralazine as needed  Reported tremors, resolved. 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  Initially suspected it could be  related to elevated BUN but this seems chronic in etiology and currently CKD is stable. EEG unremarkable -Discontinued lactulose --Continue to monitor  AKI on CKD stage 3, back to baseline.  Baseline creatinine prior to admission in November 2.2-2.4.  Creatinine of 2.9 on admission likely related to CHF exacerbation and poorly controlled blood pressure. Presented with hyperkalemia that's now resolved --Continue to hold home sacubitril/valsartan -Avoid further nephrotoxins -Monitor output -Monitor BMP  UTI secondary to Klebsiella pneumonia, resolved Denies any urinary symptoms. Urine sensitivity shows resistance to ceftriaxone. Has been on ceftriaxone throughout hospital stay without any fevers or complaints of urinary symptoms. Suspect poor  sample, given random -Discontinue IV ceftriaxone  Type 2 diabetes, well controlled A1c 7.7 (11/2019) -Sliding scale insulin, monitor CBG  Atrial fibrillation,  currently rate controlled -monitor on telemetry -Continue amiodarone as recommended by cardiology and Eliquis  HLD, stable -continue statin  Left hand pain and history of nerve impingement during previous hospitalization back in November.  Patient states Voltaren gel does not help -Started low-dose gabapentin 100 mg 3 times daily, closely monitor  History of gout. Concern could be potential flare of elbow.  Unclear if still taking allopurinol --colchicine x1 and assess resonse --check uric acid   Family Communication  :  none  Code Status :  FULL  Disposition Plan  :  Patient is from home. Anticipated d/c date:  1-2 days. Barriers to d/c or necessity for inpatient status: IV lasix for CHF flare, colchicine for gout flare, will discuss with ortho Consults  : Cardiology  Procedures  :  TTE, 01/31/20  DVT Prophylaxis  :  eliquis  MDM: The below labs and imaging reports were reviewed and summarized above.  Medication management as above.  Lab Results  Component Value Date   PLT 251 02/04/2020    Diet :  Diet Order            Diet heart healthy/carb modified Room service appropriate? No; Fluid consistency: Thin  Diet effective now                  Inpatient Medications Scheduled Meds: . amiodarone  200 mg Oral Daily  . amLODipine  10 mg Oral Daily  . apixaban  5 mg Oral BID  . atorvastatin  80 mg Oral Daily  . carvedilol  6.25 mg Oral BID WC  . furosemide  40 mg Intravenous BID  . gabapentin  100 mg Oral TID  . hydrALAZINE  25 mg Oral Q8H  . insulin aspart  0-15 Units Subcutaneous TID WC  . insulin aspart  0-5 Units Subcutaneous QHS  . levothyroxine  50 mcg Oral Q0600   Continuous Infusions:  PRN Meds:.acetaminophen **OR** acetaminophen, hydrALAZINE, Muscle Rub  Antibiotics  :   Anti-infectives  (From admission, onward)   Start     Dose/Rate Route Frequency Ordered Stop   01/31/20 0800  cefTRIAXone (ROCEPHIN) 1 g in sodium chloride 0.9 % 100 mL IVPB  Status:  Discontinued        1 g 200 mL/hr over 30 Minutes Intravenous Every 24 hours 01/30/20 1517 02/03/20 1245   01/30/20 0530  cefTRIAXone (ROCEPHIN) 1 g in sodium chloride 0.9 % 100 mL IVPB        1 g 200 mL/hr over 30 Minutes Intravenous  Once 01/30/20 0515 01/30/20 0811   01/30/20 0500  cefTRIAXone (ROCEPHIN) 1 g in sodium chloride 0.9 % 100 mL IVPB        1 g 200 mL/hr over 30 Minutes Intravenous  Once 01/30/20 0453 01/30/20 0621       Objective   Vitals:   02/03/20 2146 02/04/20 0500 02/04/20 0848 02/04/20 1114  BP: 121/89  (!) 141/101 129/90  Pulse: 91  99 96  Resp: (!) 21  (!) 25 (!) 24  Temp: 98 F (36.7 C)  98.2 F (36.8 C) 98.9 F (37.2 C)  TempSrc: Oral  Oral Oral  SpO2: 90%  93% 91%  Weight:  103.9 kg    Height:        SpO2: 91 %  Wt Readings from Last 3 Encounters:  02/04/20 103.9 kg  01/23/20 124.3 kg  01/12/20 117.7 kg     Intake/Output Summary (Last 24 hours) at 02/04/2020 1541 Last data filed at 02/04/2020  0500 Gross per 24 hour  Intake --  Output 1050 ml  Net -1050 ml    Physical Exam:   Awake Alert, Oriented X 3, Normal affect No proptosis, no thyromegaly/goiter appreciated No new F.N deficits,  [place].AT, Increased tachypnea on room air, crackles at bases, no conversational dyspnea Irregularly irregular, normal rate, no peripheral edema +ve B.Sounds, Abd Soft, No tenderness, No rebound, guarding or rigidity. No Cyanosis, No new Rash or bruise  Left hand grip decreased Left elbow  warm to touch, with slight increase swelling in comparison to right on posterior of elbow and erythema   I have personally reviewed the following:   Data Reviewed:  CBC Recent Labs  Lab 01/30/20 0210 01/30/20 0701 01/31/20 1002 02/01/20 0519 02/04/20 0537  WBC 17.7*  --  10.6* 9.6 10.4  HGB  14.6 14.3 12.1* 13.1 12.7*  HCT 47.3 42.0 37.8* 40.6 39.9  PLT 315  --  247 255 251  MCV 91.5  --  89.4 87.5 87.5  MCH 28.2  --  28.6 28.2 27.9  MCHC 30.9  --  32.0 32.3 31.8  RDW 15.3  --  15.3 15.2 15.1  LYMPHSABS 2.9  --   --   --   --   MONOABS 1.4*  --   --   --   --   EOSABS 0.1  --   --   --   --   BASOSABS 0.1  --   --   --   --     Chemistries  Recent Labs  Lab 01/30/20 1038 01/31/20 1002 02/01/20 0519 02/02/20 0727 02/03/20 0823 02/04/20 0533  NA  --  134* 135 133* 132* 131*  K  --  4.3 4.3 5.1 4.6 4.8  CL  --  103 101 100 98 97*  CO2  --  16* 22 18* 22 19*  GLUCOSE  --  135* 97 88 110* 94  BUN  --  60* 58* 64* 71* 75*  CREATININE  --  2.51* 2.21* 2.32* 2.37* 2.81*  CALCIUM  --  8.9 9.4 9.3 9.6 9.5  AST 40 34  --   --   --   --   ALT 45* 41  --   --   --   --   ALKPHOS 100 84  --   --   --   --   BILITOT 1.5* 1.2  --   --   --   --    ------------------------------------------------------------------------------------------------------------------ Recent Labs    02/03/20 0602  CHOL 89  HDL 15*  LDLCALC 49  TRIG 125  CHOLHDL 5.9    Lab Results  Component Value Date   HGBA1C 7.7 (H) 12/23/2019   ------------------------------------------------------------------------------------------------------------------ No results for input(s): TSH, T4TOTAL, T3FREE, THYROIDAB in the last 72 hours.  Invalid input(s): FREET3 ------------------------------------------------------------------------------------------------------------------ No results for input(s): VITAMINB12, FOLATE, FERRITIN, TIBC, IRON, RETICCTPCT in the last 72 hours.  Coagulation profile No results for input(s): INR, PROTIME in the last 168 hours.  Recent Labs    02/04/20 1255  DDIMER 1.30*    Cardiac Enzymes No results for input(s): CKMB, TROPONINI, MYOGLOBIN in the last 168 hours.  Invalid input(s):  CK ------------------------------------------------------------------------------------------------------------------    Component Value Date/Time   BNP 74.1 01/30/2020 1038    Micro Results Recent Results (from the past 240 hour(s))  Urine culture     Status: Abnormal   Collection Time: 01/30/20  3:26 AM   Specimen: Urine, Random  Result Value Ref Range Status  Specimen Description   Final    URINE, RANDOM Performed at [facility], [place] [address] [place], [place] [address]    Special Requests   Final    NONE Performed at [facility], [place] [address], [place], [place] [address]    Culture MULTIPLE SPECIES PRESENT, SUGGEST RECOLLECTION (A)  Final   Report Status 01/31/2020 FINAL  Final  Blood culture (routine x 2)     Status: None   Collection Time: 01/30/20  4:45 AM   Specimen: BLOOD RIGHT HAND  Result Value Ref Range Status   Specimen Description   Final    BLOOD RIGHT HAND Performed at [place] [address], [place], [place] [address]    Special Requests   Final    BOTTLES DRAWN AEROBIC AND ANAEROBIC Blood Culture results may not be optimal due to an inadequate volume of blood received in culture bottles Performed at [place] [address] [place], [place] [address]    Culture   Final    NO GROWTH 5 DAYS Performed at [facility], [place] [address], [place], [place] [address]    Report Status 02/04/2020 FINAL  Final  Blood culture (routine x 2)     Status: None   Collection Time: 01/30/20 10:38 AM   Specimen: BLOOD  Result Value Ref Range Status   Specimen Description   Final    BLOOD BLOOD LEFT FOREARM Performed at [place] [address] [place], [place] [address]    Special Requests   Final    BOTTLES DRAWN AEROBIC AND ANAEROBIC Blood Culture adequate volume Performed at [place] [address] [place], [place]  [address]    Culture   Final    NO GROWTH 5 DAYS Performed at [facility], [place] [address], [place], [place] [address]    Report Status 02/04/2020 FINAL  Final  Culture, Urine     Status: Abnormal   Collection Time: 02/01/20  2:18 PM   Specimen: Urine, Random  Result Value Ref Range Status   Specimen Description   Final    URINE, RANDOM Performed at [place] [address], [place], [place] [address]    Special Requests   Final    NONE Performed at [facility], [place] [address], [place],  [address]    Culture (A)  Final    50,000 COLONIES/mL KLEBSIELLA PNEUMONIAE Confirmed Extended Spectrum Beta-Lactamase Producer (ESBL).  In bloodstream infections from ESBL organisms, carbapenems are preferred over piperacillin/tazobactam. They are shown to have a lower risk of mortality.    Report Status 02/03/2020 FINAL  Final   Organism ID, Bacteria KLEBSIELLA PNEUMONIAE (A)  Final      Susceptibility   Klebsiella pneumoniae - MIC*    AMPICILLIN >=32 RESISTANT Resistant     CEFAZOLIN >=64 RESISTANT Resistant     CEFEPIME >=32 RESISTANT Resistant     CEFTRIAXONE >=64 RESISTANT Resistant     CIPROFLOXACIN <=0.25 SENSITIVE Sensitive     GENTAMICIN <=1 SENSITIVE Sensitive     IMIPENEM <=0.25 SENSITIVE Sensitive     NITROFURANTOIN 128 RESISTANT Resistant     TRIMETH/SULFA >=320 RESISTANT Resistant     AMPICILLIN/SULBACTAM >=32 RESISTANT Resistant     PIP/TAZO 8 SENSITIVE Sensitive     * 50,000 COLONIES/mL KLEBSIELLA PNEUMONIAE  SARS Coronavirus 2 by RT PCR (hospital order, performed in [facility] hospital lab) Nasopharyngeal Nasopharyngeal Swab  Status: None   Collection Time: 02/02/20 11:55 AM   Specimen: Nasopharyngeal Swab  Result Value Ref Range Status   SARS Coronavirus 2 NEGATIVE NEGATIVE Final    Comment: (NOTE) SARS-CoV-2 target nucleic acids are NOT DETECTED.  The SARS-CoV-2 RNA is generally detectable in upper and  lower respiratory specimens during the acute phase of infection. The lowest concentration of SARS-CoV-2 viral copies this assay can detect is 250 copies / mL. A negative result does not preclude SARS-CoV-2 infection and should not be used as the sole basis for treatment or other patient management decisions.  A negative result may occur with improper specimen collection / handling, submission of specimen other than nasopharyngeal swab, presence of viral mutation(s) within the areas targeted by this assay, and inadequate number of viral copies (<250 copies / mL). A negative result must be combined with clinical observations, patient history, and epidemiological information.  Fact Sheet for Patients:   StrictlyIdeas.no  Fact Sheet for Healthcare Providers: BankingDealers.co.za  This test is not yet approved or  cleared by the [place] FDA and has been authorized for detection and/or diagnosis of SARS-CoV-2 by FDA under an Emergency Use Authorization (EUA).  This EUA will remain in effect (meaning this test can be used) for the duration of the COVID-19 declaration under Section 564(b)(1) of the Act, 21 U.S.C. section 360bbb-3(b)(1), unless the authorization is terminated or revoked sooner.  Performed at [facility], [place] [address] [place], [place] [address]     Radiology Reports DG Chest 2 View  Result Date: 01/30/2020 CLINICAL DATA:  Shortness of breath and elevated white blood cell count EXAM: CHEST - 2 VIEW COMPARISON:  12/28/2019 FINDINGS: Cardiomegaly and vascular pedicle widening with aortic tortuosity. There is improved aeration since prior. Congested appearance of vessels without focal pneumonia. No visible effusion or pneumothorax. IMPRESSION: Cardiomegaly and vascular congestion. Aeration is improved since comparison last month. Electronically Signed   By: Monte Fantasia M.D.   On: 01/30/2020 05:12    DG Elbow 2 Views Left  Result Date: 02/03/2020 CLINICAL DATA:  Pain and swelling for 4 days, no known injury EXAM: LEFT ELBOW - 2 VIEW COMPARISON:  None. FINDINGS: There is no evidence of fracture, dislocation, or joint effusion. Moderate arthrosis. Soft tissue edema posteriorly. IMPRESSION: 1. No fracture or dislocation of the left elbow. Moderate arthrosis. 2.  Soft tissue edema posteriorly. Electronically Signed   By: [name] M.D.   On: 02/03/2020 11:39   DG Hand 2 View Left  Result Date: 01/10/2020 CLINICAL DATA:  Left hand pain and swelling EXAM: LEFT HAND - 2 VIEW COMPARISON:  None. FINDINGS: There is no evidence of fracture or dislocation. There is no evidence of arthropathy or other focal bone abnormality. Soft tissues are unremarkable. IMPRESSION: Negative. Electronically Signed   By: Prudencio Pair M.D.   On: 01/10/2020 15:50   MR ELBOW LEFT WO CONTRAST  Result Date: 02/04/2020 CLINICAL DATA:  Elbow pain, soft tissue infection EXAM: MRI OF THE LEFT ELBOW WITHOUT CONTRAST TECHNIQUE: Multiplanar, multisequence MR imaging of the elbow was attempted. No intravenous contrast was administered. COMPARISON:  Left elbow x-ray 02/03/2020 FINDINGS: Nondiagnostic study as only a single axial T1 weighted sequence was performed. Patient refused any further imaging. On the included images there is evidence of subcutaneous soft tissue edema at the posterior aspect of the elbow and proximal forearm. There also appears to be an elbow joint effusion. No fluid sensitive sequences were obtained. IMPRESSION: 1. Nondiagnostic study as only  a single axial T1-weighted sequence was performed. Patient refused any further imaging. 2. On the included images, there is evidence of subcutaneous soft tissue edema at the posterior aspect of the elbow and proximal forearm and suggestion of an elbow joint effusion. If there is clinical concern for septic arthritis, an arthrocentesis should be performed. Electronically  Signed   By: Davina Poke D.O.   On: 02/04/2020 14:17   US Venous Img Upper Uni Left (DVT)  Result Date: 01/07/2020 CLINICAL DATA:  Pain left arm x2 weeks EXAM: LEFT UPPER EXTREMITY VENOUS DOPPLER ULTRASOUND TECHNIQUE: Gray-scale sonography with graded compression, as well as color Doppler and duplex ultrasound were performed to evaluate the upper extremity deep venous system from the level of the subclavian vein and including the jugular, axillary, basilic, radial, ulnar and upper cephalic vein. Spectral Doppler was utilized to evaluate flow at rest and with distal augmentation maneuvers. COMPARISON:  None. FINDINGS: Contralateral Subclavian Vein: Respiratory phasicity is normal and symmetric with the symptomatic side. No evidence of thrombus. Normal compressibility. Internal Jugular Vein: No evidence of thrombus. Normal compressibility, respiratory phasicity and response to augmentation. Subclavian Vein: No evidence of thrombus. Normal compressibility, respiratory phasicity and response to augmentation. Axillary Vein: No evidence of thrombus. Normal compressibility, respiratory phasicity and response to augmentation. Cephalic Vein: No evidence of thrombus. Normal compressibility, respiratory phasicity and response to augmentation. Basilic Vein: No evidence of thrombus. Normal compressibility, respiratory phasicity and response to augmentation. Brachial Veins: No evidence of thrombus. Normal compressibility, respiratory phasicity and response to augmentation. Radial Veins: No evidence of thrombus. Normal compressibility, respiratory phasicity and response to augmentation. Ulnar Veins: No evidence of thrombus. Normal compressibility, respiratory phasicity and response to augmentation. Other Findings:  None visualized. IMPRESSION: No evidence of DVT within the left upper extremity. Electronically Signed   By: Constance Holster M.D.   On: 01/07/2020 18:44   DG CHEST PORT 1 VIEW  Result Date:  02/04/2020 CLINICAL DATA:  Tachypnea, history of AFib. EXAM: PORTABLE CHEST 1 VIEW COMPARISON:  01/30/2020 and prior FINDINGS: No pneumothorax or pleural effusion. Patchy bilateral pulmonary opacities, unchanged. Cardiomegaly and central pulmonary vascular congestion similar to prior exam. No acute osseous abnormality. IMPRESSION: Bilateral pulmonary opacities, unchanged. Cardiomegaly and central pulmonary vascular congestion. Electronically Signed   By: Primitivo Gauze M.D.   On: 02/04/2020 13:19   EEG adult  Result Date: 01/30/2020 Lora Havens, MD     01/30/2020  8:55 PM Patient Name: Hector [name] MRN: [id number] Epilepsy Attending: Lora Havens Referring Physician/Provider: Dr Cherylann Ratel Date: 01/30/2020 Duration: 24.26 mins Patient history: 54yo M with upper extremity shaking. EEG to evaluate for seizure Level of alertness: Awake,  Asleep AEDs during EEG study: None Technical aspects: This EEG study was done with scalp electrodes positioned according to the 10-20 International system of electrode placement. Electrical activity was acquired at a sampling rate of 500Hz  and reviewed with a high frequency filter of 70Hz  and a low frequency filter of 1Hz . EEG data were recorded continuously and digitally stored. Description: The posterior dominant rhythm consists of 8 Hz activity of moderate voltage (25-35 uV) seen predominantly in posterior head regions, symmetric and reactive to eye opening and eye closing. Sleep was characterized by vertex waves, sleep spindles (12 to 14 Hz), maximal frontocentral region. Hyperventilation and photic stimulation were not performed.   IMPRESSION: This study is within normal limits. No seizures or epileptiform discharges were seen throughout the recording. Priyanka Barbra Sarks   ECHOCARDIOGRAM COMPLETE  Result Date:  01/31/2020    ECHOCARDIOGRAM REPORT   Patient Name:   Hector [name] Date of Exam: 01/31/2020 Medical Rec #:  [id number]           Height:        71.0 in Accession #:    [id number]          Weight:       228.4 lb Date of Birth:  May 17, 1965           BSA:          2.231 m Patient Age:    40 years            BP:           145/108 mmHg Patient Gender: M                   HR:           108 bpm. Exam Location:  Inpatient Procedure: 2D Echo, Cardiac Doppler, Color Doppler and Intracardiac            Opacification Agent Indications:    CHF-Acute Diastolic 195.09 / T26.71  History:        Patient has prior history of Echocardiogram examinations, most                 recent 12/03/2016. Cardiomyopathy, Arrythmias:Atrial                 Fibrillation and Atrial Flutter; Risk Factors:Hypertension,                 Dyslipidemia and Former Smoker. Elevated troponin.  Sonographer:    Vickie Epley RDCS Referring Phys: [id number] [facility] D Abby Tucholski  Sonographer Comments: Image acquisition challenging due to respiratory motion. IMPRESSIONS  1. Left ventricular ejection fraction, by estimation, is 45 to 50%. The left ventricle has normal function. The left ventricle demonstrates regional wall motion abnormalities. Abnormal (paradoxical) septal motion, consistent with left bundle branch block. Left ventricular diastolic parameters are indeterminate.  2. Right ventricular systolic function is normal. The right ventricular size is normal. Tricuspid regurgitation signal is inadequate for assessing PA pressure.  3. The mitral valve is normal in structure. No evidence of mitral valve regurgitation.  4. The aortic valve was not well visualized. Aortic valve regurgitation is not visualized. No aortic stenosis is present.  5. The inferior vena cava is normal in size with greater than 50% respiratory variability, suggesting right atrial pressure of 3 mmHg. FINDINGS  Left Ventricle: Left ventricular ejection fraction, by estimation, is 45 to 50%. The left ventricle has normal function. The left ventricle demonstrates regional wall motion abnormalities. Definity contrast agent was given IV to  delineate the left ventricular endocardial borders. The left ventricular internal cavity size was normal in size. There is no left ventricular hypertrophy. Abnormal (paradoxical) septal motion, consistent with left bundle branch block. Left ventricular diastolic parameters  are indeterminate. Right Ventricle: The right ventricular size is normal. Right vetricular wall thickness was not assessed. Right ventricular systolic function is normal. Tricuspid regurgitation signal is inadequate for assessing PA pressure. Left Atrium: Left atrial size was normal in size. Right Atrium: Right atrial size was normal in size. Pericardium: There is no evidence of pericardial effusion. Mitral Valve: The mitral valve is normal in structure. No evidence of mitral valve regurgitation. Tricuspid Valve: The tricuspid valve is normal in structure. Tricuspid valve regurgitation is not demonstrated. Aortic Valve: The aortic valve was not well visualized. Aortic valve regurgitation is not visualized.  No aortic stenosis is present. Pulmonic Valve: The pulmonic valve was not well visualized. Pulmonic valve regurgitation is not visualized. Aorta: The aortic root is normal in size and structure. Venous: The inferior vena cava is normal in size with greater than 50% respiratory variability, suggesting right atrial pressure of 3 mmHg. IAS/Shunts: The interatrial septum was not well visualized.  LEFT VENTRICLE PLAX 2D LVIDd:         4.60 cm      Diastology LVIDs:         3.60 cm      LV e' medial:    5.19 cm/s LV PW:         1.00 cm      LV E/e' medial:  10.8 LV IVS:        1.00 cm      LV e' lateral:   9.24 cm/s LVOT diam:     2.30 cm      LV E/e' lateral: 6.0 LV SV:         73 LV SV Index:   33 LVOT Area:     4.15 cm  LV Volumes (MOD) LV vol d, MOD A2C: 183.0 ml LV vol d, MOD A4C: 159.0 ml LV vol s, MOD A2C: 86.8 ml LV vol s, MOD A4C: 84.2 ml LV SV MOD A2C:     96.2 ml LV SV MOD A4C:     159.0 ml LV SV MOD BP:      83.4 ml RIGHT VENTRICLE RV S  prime:     15.30 cm/s TAPSE (M-mode): 1.5 cm LEFT ATRIUM             Index       RIGHT ATRIUM           Index LA diam:        3.40 cm 1.52 cm/m  RA Area:     12.30 cm LA Vol (A2C):   39.2 ml 17.57 ml/m RA Volume:   26.40 ml  11.83 ml/m LA Vol (A4C):   37.7 ml 16.90 ml/m LA Biplane Vol: 42.5 ml 19.05 ml/m  AORTIC VALVE LVOT Vmax:   132.00 cm/s LVOT Vmean:  91.900 cm/s LVOT VTI:    0.175 m MITRAL VALVE MV Area (PHT): 4.49 cm    SHUNTS MV Decel Time: 169 msec    Systemic VTI:  0.18 m MV E velocity: 55.90 cm/s  Systemic Diam: 2.30 cm MV A velocity: 68.60 cm/s MV E/A ratio:  0.81 Oswaldo Milian MD Electronically signed by Oswaldo Milian MD Signature Date/Time: 01/31/2020/1:13:35 PM    Final      Time Spent in minutes  30     Desiree Hane M.D on 02/04/2020 at 3:41 PM  To page go to [email or web address] - password [facility]

## 2020-02-05 DIAGNOSIS — M109 Gout, unspecified: Secondary | ICD-10-CM

## 2020-02-05 LAB — GLUCOSE, CAPILLARY
Glucose-Capillary: 121 mg/dL — ABNORMAL HIGH (ref 70–99)
Glucose-Capillary: 179 mg/dL — ABNORMAL HIGH (ref 70–99)
Glucose-Capillary: 159 mg/dL — ABNORMAL HIGH (ref 70–99)
Glucose-Capillary: 148 mg/dL — ABNORMAL HIGH (ref 70–99)

## 2020-02-05 LAB — POTASSIUM
Potassium: 5.6 mmol/L — ABNORMAL HIGH (ref 3.5–5.1)
Potassium: 5.3 mmol/L — ABNORMAL HIGH (ref 3.5–5.1)
Potassium: 4.8 mmol/L (ref 3.5–5.1)

## 2020-02-05 LAB — BASIC METABOLIC PANEL
Anion gap: 16 — ABNORMAL HIGH (ref 5–15)
BUN: 87 mg/dL — ABNORMAL HIGH (ref 6–20)
CO2: 19 mmol/L — ABNORMAL LOW (ref 22–32)
Calcium: 9.4 mg/dL (ref 8.9–10.3)
Chloride: 96 mmol/L — ABNORMAL LOW (ref 98–111)
Creatinine, Ser: 3.08 mg/dL — ABNORMAL HIGH (ref 0.61–1.24)
GFR, Estimated: 23 mL/min — ABNORMAL LOW (ref >60)
Glucose, Bld: 156 mg/dL — ABNORMAL HIGH (ref 70–99)
Potassium: 5.5 mmol/L — ABNORMAL HIGH (ref 3.5–5.1)
Sodium: 131 mmol/L — ABNORMAL LOW (ref 135–145)

## 2020-02-05 LAB — NA AND K (SODIUM & POTASSIUM), RAND UR
Potassium Urine: 30 mmol/L
Sodium, Ur: 39 mmol/L

## 2020-02-05 LAB — CBC
HCT: 41.7 % (ref 39.0–52.0)
Hemoglobin: 13.2 g/dL (ref 13.0–17.0)
MCH: 28.1 pg (ref 26.0–34.0)
MCHC: 31.7 g/dL (ref 30.0–36.0)
MCV: 88.9 fL (ref 80.0–100.0)
Platelets: 266 10*3/uL (ref 150–400)
RBC: 4.69 MIL/uL (ref 4.22–5.81)
RDW: 15 % (ref 11.5–15.5)
WBC: 7.5 10*3/uL (ref 4.0–10.5)
nRBC: 0 % (ref 0.0–0.2)

## 2020-02-05 LAB — THYROID STIMULATING IMMUNOGLOBULIN: Thyroid Stimulating Immunoglob: <0.1 IU/L (ref 0.00–0.55)

## 2020-02-05 MED ORDER — LIDOCAINE HCL 2 % IJ SOLN
INTRAMUSCULAR | Status: AC
  Filled 2020-02-05: qty 20

## 2020-02-05 MED ORDER — HYDRALAZINE HCL 50 MG PO TABS
50.0000 mg | ORAL_TABLET | Freq: Three times a day (TID) | ORAL | Status: DC
  Administered 2020-02-05 – 2020-02-08 (×8): 50 mg via ORAL
  Filled 2020-02-05 (×8): qty 1

## 2020-02-05 MED ORDER — SODIUM ZIRCONIUM CYCLOSILICATE 5 G PO PACK
5.0000 g | PACK | Freq: Three times a day (TID) | ORAL | Status: DC
  Administered 2020-02-05: 21:00:00 5 g via ORAL
  Filled 2020-02-05 (×2): qty 1

## 2020-02-05 MED ORDER — SODIUM ZIRCONIUM CYCLOSILICATE 10 G PO PACK
10.0000 g | PACK | Freq: Once | ORAL | Status: AC
  Administered 2020-02-05: 16:00:00 10 g via ORAL
  Filled 2020-02-05: qty 1

## 2020-02-05 MED ORDER — SODIUM ZIRCONIUM CYCLOSILICATE 5 G PO PACK
10.0000 g | PACK | Freq: Once | ORAL | Status: AC
  Administered 2020-02-05: 14:00:00 10 g via ORAL
  Filled 2020-02-05: qty 2

## 2020-02-05 NOTE — Consult Note (Signed)
Reason for Consult: left elbow pain Referring Physician: Oretha Milch, MD  Hector [name] is an 54 y.o. male.  HPI: 54 yo male admitted for UTI Complains of left elbow pain.  History of gout reported. Question of infection to left elbow versus reactive gouty flare    Past Medical History:  Diagnosis Date  . Alcohol abuse   . 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 12/2007 MV: EF 28%, no isch;  b. 8/12 Echo: EF 25-35%; c. 02/2014 Echo: EF 20-25%; d. 12/2014 Cath: minimal CAD; e. 01/2015 Echo: EF 50-55%;  d. 05/2016 Echo: EF 30-35%, diff HK, gr2 DD; e. 11/2016 Echo: EF 45-50%, diff HK.  . Chronic combined systolic (congestive) and diastolic (congestive) heart failure ([name])    a. 05/2016 Most recent Echo: EF 30-35%, diff HK, Gr2 DD, mild MR, mod dil LA; b. 11/2016 Echo: EF 45-50%, diff HK.  . CKD (chronic kidney disease), stage III ([name])   . Elevated troponin    Chronically elevated. - level was 0.17-0.10 during recent admission  . Essential hypertension   . GI bleed 11/2013  . Hyperlipidemia   . Pancreatitis   . Paroxysmal A-fib ([name])    a. new onset s/p unsuccessful TEE/DCCV on 08/16/2014; b. on amio/eliquis (CHA2DS2VASc = 2-3); c. reports 1-2 hrs of afib ~ q34mos.  . Paroxysmal atrial flutter ([name])    a. new onset 07/2014; b. s/p unsuccessful TEE/DCCV 08/16/2014; c. on apixaban.  . Sleep-disordered breathing    Has yet to have a sleep study    Past Surgical History:  Procedure Laterality Date  . CARDIAC CATHETERIZATION N/A 01/09/2015   Procedure: Left Heart Cath and Coronary Angiography;  Surgeon: Hector Man, MD;  Location: [facility];  Service: Cardiovascular;  Laterality: N/A;  . ELECTROPHYSIOLOGIC STUDY N/A 08/16/2014   Procedure: CARDIOVERSION;  Surgeon: Hector Merritts, MD;  Location: [facility];  Service: Cardiovascular;  Laterality: N/A;  . FLEXIBLE SIGMOIDOSCOPY N/A 10/10/2015   Procedure: FLEXIBLE SIGMOIDOSCOPY;  Surgeon: Hector Sails, MD;  Location:  [facility];  Service: Endoscopy;  Laterality: N/A;  . KNEE SURGERY Right   . NM MYOVIEW LTD  November 2011   No ischemia or infarction. EF 50-55% ( no improvement from 2009 Myoview EF of 28%  . TEE WITHOUT CARDIOVERSION N/A 08/16/2014   Procedure: TRANSESOPHAGEAL ECHOCARDIOGRAM (TEE);  Surgeon: Hector Merritts, MD;  Location: [facility];  Service: Cardiovascular;  Laterality: N/A;    Family History  Problem Relation Age of Onset  . Hypertension Mother   . Hyperlipidemia Mother   . Diabetes Mother     Social History:  reports that he quit smoking about 21 years ago. He has a 12.00 pack-year smoking history. He has never used smokeless tobacco. He reports that he does not drink alcohol and does not use drugs.  Allergies:  Allergies  Allergen Reactions  . Esomeprazole Magnesium Other (See Comments)    Suspected interstitial nephritis 2018  . Eggs Or Egg-Derived Products Rash    Medications:  I have reviewed the patient's current medications. Scheduled: . amiodarone  200 mg Oral Daily  . amLODipine  10 mg Oral Daily  . apixaban  5 mg Oral BID  . atorvastatin  80 mg Oral Daily  . carvedilol  6.25 mg Oral BID WC  . gabapentin  100 mg Oral TID  . hydrALAZINE  50 mg Oral Q8H  . insulin aspart  0-15 Units Subcutaneous TID WC  . insulin aspart  0-5 Units  Subcutaneous QHS  . levothyroxine  50 mcg Oral Q0600  . predniSONE  30 mg Oral Q breakfast  . sodium zirconium cyclosilicate  5 g Oral TID    Results for orders placed or performed during the hospital encounter of 01/30/20 (from the past 24 hour(s))  Basic metabolic panel     Status: Abnormal   Collection Time: 02/05/20  5:08 AM  Result Value Ref Range   Sodium 131 (L) 135 - 145 mmol/L   Potassium 5.5 (H) 3.5 - 5.1 mmol/L   Chloride 96 (L) 98 - 111 mmol/L   CO2 19 (L) 22 - 32 mmol/L   Glucose, Bld 156 (H) 70 - 99 mg/dL   BUN 87 (H) 6 - 20 mg/dL   Creatinine, Ser 3.08 (H) 0.61 - 1.24 mg/dL   Calcium 9.4 8.9 - 10.3 mg/dL    GFR, Estimated 23 (L) >60 mL/min   Anion gap 16 (H) 5 - 15  CBC     Status: None   Collection Time: 02/05/20  5:08 AM  Result Value Ref Range   WBC 7.5 4.0 - 10.5 K/uL   RBC 4.69 4.22 - 5.81 MIL/uL   Hemoglobin 13.2 13.0 - 17.0 g/dL   HCT 41.7 39.0 - 52.0 %   MCV 88.9 80.0 - 100.0 fL   MCH 28.1 26.0 - 34.0 pg   MCHC 31.7 30.0 - 36.0 g/dL   RDW 15.0 11.5 - 15.5 %   Platelets 266 150 - 400 K/uL   nRBC 0.0 0.0 - 0.2 %  Glucose, capillary     Status: Abnormal   Collection Time: 02/05/20  7:17 AM  Result Value Ref Range   Glucose-Capillary 121 (H) 70 - 99 mg/dL  Glucose, capillary     Status: Abnormal   Collection Time: 02/05/20 11:14 AM  Result Value Ref Range   Glucose-Capillary 179 (H) 70 - 99 mg/dL  Potassium     Status: Abnormal   Collection Time: 02/05/20 12:32 PM  Result Value Ref Range   Potassium 5.6 (H) 3.5 - 5.1 mmol/L  Na and K (sodium & potassium), rand urine     Status: None   Collection Time: 02/05/20 12:48 PM  Result Value Ref Range   Sodium, Ur 39 mmol/L   Potassium Urine 30 mmol/L  Potassium     Status: Abnormal   Collection Time: 02/05/20  3:44 PM  Result Value Ref Range   Potassium 5.3 (H) 3.5 - 5.1 mmol/L  Glucose, capillary     Status: Abnormal   Collection Time: 02/05/20  5:13 PM  Result Value Ref Range   Glucose-Capillary 159 (H) 70 - 99 mg/dL  Potassium     Status: None   Collection Time: 02/05/20  8:25 PM  Result Value Ref Range   Potassium 4.8 3.5 - 5.1 mmol/L  Glucose, capillary     Status: Abnormal   Collection Time: 02/05/20  8:50 PM  Result Value Ref Range   Glucose-Capillary 148 (H) 70 - 99 mg/dL     X-ray: CLINICAL DATA:  Pain and swelling for 4 days, no known injury  EXAM: LEFT ELBOW - 2 VIEW  COMPARISON:  None.  FINDINGS: There is no evidence of fracture, dislocation, or joint effusion. Moderate arthrosis. Soft tissue edema posteriorly.  IMPRESSION: 1. No fracture or dislocation of the left elbow. Moderate  arthrosis.  2.  Soft tissue edema posteriorly.   Electronically Signed   By: Hector [name] M.D.   On: 02/03/2020 11:39  CLINICAL DATA:  Elbow pain, soft tissue infection  EXAM: MRI OF THE LEFT ELBOW WITHOUT CONTRAST  TECHNIQUE: Multiplanar, multisequence MR imaging of the elbow was attempted. No intravenous contrast was administered.  COMPARISON:  Left elbow x-ray 02/03/2020  FINDINGS: Nondiagnostic study as only a single axial T1 weighted sequence was performed. Patient refused any further imaging.  On the included images there is evidence of subcutaneous soft tissue edema at the posterior aspect of the elbow and proximal forearm. There also appears to be an elbow joint effusion. No fluid sensitive sequences were obtained.  IMPRESSION: 1. Nondiagnostic study as only a single axial T1-weighted sequence was performed. Patient refused any further imaging. 2. On the included images, there is evidence of subcutaneous soft tissue edema at the posterior aspect of the elbow and proximal forearm and suggestion of an elbow joint effusion. If there is clinical concern for septic arthritis, an arthrocentesis should be performed.   Electronically Signed   By: Davina Poke D.O.   On: 02/04/2020 14:17  ROS  As per HPI  Blood pressure (!) 141/98, pulse 98, temperature 98 F (36.7 C), temperature source Oral, resp. rate (!) 21, height 5\' 11"  (1.803 m), weight 101.4 kg, SpO2 94 %.  Physical Exam  54 yo male awake alert, in NAD Left elbow exam Some warmth but no significant erythema No significant olecranon swelling Passive ROM tolerated but with limited extension and flexion due to pain  Reports some difficulties with finger abduction with numbness sensation  Assessment/Plan: 1. Left elbow pain  Plan: At the time of original consult I suggested trialling prednisone due to gout history.  He was already started in colchicine.  Elevated creatinine indicating  renal impairment so other NSAIDs not applicable. He notes some improvement in his elbow pain since start of these medications  Procedure note: Despite this I did attempt aspiration of the left elbow joint.  Under a betadine prep of the lateral elbow, landmarks (olecranon, radial head and lateral epicondyle) were identified.  Local infiltration of lidocaine was followed by attempted aspiration.  No joint fluid was aspirated. Aspiration site was covered with Bandaid.  Based on these results I would recommend continued treatment plan of a prednisone taper and colchicine for now.  He can follow up in our office with Dr Jeannie Fend or Caralyn Guile in 1-2 weeks if symptoms persist.  Mauri Pole 02/05/2020, 10:43 PM

## 2020-02-05 NOTE — Plan of Care (Signed)
  Problem: Activity: Goal: Risk for activity intolerance will decrease Outcome: Progressing   Problem: Nutrition: Goal: Adequate nutrition will be maintained Outcome: Progressing   Problem: Coping: Goal: Level of anxiety will decrease Outcome: Progressing   Problem: Education: Goal: Knowledge of General Education information will improve Description: Including pain rating scale, medication(s)/side effects and non-pharmacologic comfort measures Outcome: Completed/Met   

## 2020-02-05 NOTE — Progress Notes (Addendum)
[facility]  PROGRESS NOTE  Hector [name] OZH:[id number] DOB: 1966-01-23 DOA: 01/30/2020 PCP: [name], MD Admit date - 01/30/2020   Admitting Physician Jonnie Finner, DO  Outpatient Primary MD for the patient is [name], MD  LOS - 6 Brief Narrative   Hector [name] is a 54 y.o. year old male with medical history significant for atrial fibrillation on Eliquis, CHF, diabetes, CKD stage III who presented on 01/30/2020 with upper extremity tremors and was found to have UTI, acute on chronic diastolic CHF, and poorly controlled hypertension  Subjective  Still having 7/10 elbow pain.   A & P   Left elbow pain, with some swelling. Patient reports history of gout. MRI shows soft tissue edema concerning for joint effusion. DDx includes infectious bursitis/septic arthritis, non-infectious arthritis/gout flare. Remains afebrile, no leukocytosis and hemodynamically stable, it is warm, tender, with some overlying erythema and seems to be improving on colchicine x1 and prednisone. X-ray shows no  fracture, moderate arthrosis with soft tissue edema. Was previously on ceftriaxone for presumed UTI on admission -Supportive care with cool compresses, analgesics --unclear if infectious vs noninfection given hemodynamically stable and no sepsis physiology and improvement without antibiotics will continue prednisone 30 mg daily and monitor response.  -- If becomes febrile or acutely decompensates overnight low threshold to discontinue steroids and add empiric antibiotics --Discussed with ortho on 12/12 who will evaluate response and decide if may warrant arthrocentesis to rule out septic arthritis/infectious bursitis vs gout flare  Evidence of hyperthyroidism in a patient with history of hypothyroidism. Suspect exogenous Synthroid is likely culprit. T3, free 16, free T4 4. Cardiology does not think amiodarone has induced this toxicity. Patient has low-grade tachycardia but  otherwise no overt symptoms of hyperthyroidism. Receptor antibodies negative -Thyroid-stimulating immunoglobulins pending -Synthroid was initially discontinued, will restart at low dose -Will need repeat TSH, T3, T4 as outpatient - to monitor on telemetry  Acute on chronic diastolic CHF exacerbation, worsening.  TTE shows preserved EF with indeterminate diastolic function on admission, repeat CXR on 12/12 with vascular congestion and tachypnea now resolved. -4 L out this hospital stay, 1.5 L out yesterday.  --Given AKI will give diuresis holiday today and monitor BMP -Monitor volume status -Daily weights, strict I's and O's -Closely monitor blood pressure -Continue Coreg, discontinue further use of Entresto as recommended by cardiology  Hypertension, not at goal still. SBP improved but DBP still quite high -Continue amlodipine 10 mg,Coreg to 12.5 mg twice daily per cardiology,  --increase hydralazine to 50 mg TID -Entresto discontinued given renal insufficiency -IV hydralazine as needed  Reported tremors, resolved. 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  Initially suspected it could be  related to elevated BUN but this seems chronic in etiology and currently CKD is stable. EEG unremarkable -Discontinued lactulose --Continue to monitor  AKI on CKD stage 3 with hyperkalemia, worse.  Baseline creatinine prior to admission in November 2.2-2.4.  Creatinine of 2.9 on admission likely related to CHF exacerbation and poorly controlled blood pressure. Now with creatinine of 3.08 and hyperkalemia with no peaked t waves, Query related to IV lasix in last 24 hours --Continue to hold home sacubitril/valsartan -Avoid further nephrotoxins -Monitor output --serial K check, lokelma scheduled to TID -Monitor BMP on diuresis holiday, if continues to worsen will consult renal for assistance and obtain renal ultrasound  UTI secondary to Klebsiella  pneumonia, resolved Denies any urinary symptoms. Urine sensitivity shows resistance to  ceftriaxone. Has been on ceftriaxone throughout hospital stay without any fevers or complaints of urinary symptoms. Suspect poor sample, given random -Discontinue IV ceftriaxone  Type 2 diabetes, well controlled A1c 7.7 (11/2019) -Sliding scale insulin, monitor CBG  Atrial fibrillation, currently rate controlled -monitor on telemetry -Continue amiodarone as recommended by cardiology and Eliquis  HLD, stable -continue statin  Left hand pain and history of nerve impingement during previous hospitalization back in November.  Patient states Voltaren gel does not help -Started low-dose gabapentin 100 mg 3 times daily, closely monitor  History of gout. Concern could be potential flare of elbow.  Unclear if still taking allopurinol. Uric acid 9 --colchicine x1 and doing well on oral prednisone    Family Communication  :  none  Code Status :  FULL  Disposition Plan  :  Patient is fromSNF Anticipated d/c date:  1-2 days. Barriers to d/c or necessity for inpatient status: diuresis holiday, monitor kidney function, prednisone for gout flare,  Consults  : Cardiology, Ortho  Procedures  :  TTE, 01/31/20  DVT Prophylaxis  :  eliquis  MDM: The below labs and imaging reports were reviewed and summarized above.  Medication management as above.  Lab Results  Component Value Date   PLT 266 02/05/2020    Diet :  Diet Order            Diet heart healthy/carb modified Room service appropriate? No; Fluid consistency: Thin  Diet effective now                  Inpatient Medications Scheduled Meds: . lidocaine      . amiodarone  200 mg Oral Daily  . amLODipine  10 mg Oral Daily  . apixaban  5 mg Oral BID  . atorvastatin  80 mg Oral Daily  . carvedilol  6.25 mg Oral BID WC  . gabapentin  100 mg Oral TID  . hydrALAZINE  25 mg Oral Q8H  . insulin aspart  0-15 Units Subcutaneous TID WC  . insulin  aspart  0-5 Units Subcutaneous QHS  . levothyroxine  50 mcg Oral Q0600  . predniSONE  30 mg Oral Q breakfast  . sodium zirconium cyclosilicate  5 g Oral TID   Continuous Infusions:  PRN Meds:.acetaminophen **OR** acetaminophen, hydrALAZINE, Muscle Rub  Antibiotics  :   Anti-infectives (From admission, onward)   Start     Dose/Rate Route Frequency Ordered Stop   01/31/20 0800  cefTRIAXone (ROCEPHIN) 1 g in sodium chloride 0.9 % 100 mL IVPB  Status:  Discontinued        1 g 200 mL/hr over 30 Minutes Intravenous Every 24 hours 01/30/20 1517 02/03/20 1245   01/30/20 0530  cefTRIAXone (ROCEPHIN) 1 g in sodium chloride 0.9 % 100 mL IVPB        1 g 200 mL/hr over 30 Minutes Intravenous  Once 01/30/20 0515 01/30/20 0811   01/30/20 0500  cefTRIAXone (ROCEPHIN) 1 g in sodium chloride 0.9 % 100 mL IVPB        1 g 200 mL/hr over 30 Minutes Intravenous  Once 01/30/20 0453 01/30/20 0621       Objective   Vitals:   02/05/20 1400 02/05/20 1500 02/05/20 1800 02/05/20 1814  BP: (!) 139/92 (!) 148/99 (!) 147/98 (!) 147/101  Pulse: 95 97 99 98  Resp: (!) 23 (!) 22 (!) 21 16  Temp:   98.1 F (36.7 C)   TempSrc:   Axillary   SpO2: 95% 94%  91% 94%  Weight:      Height:        SpO2: 94 %  Wt Readings from Last 3 Encounters:  02/05/20 101.4 kg  01/23/20 124.3 kg  01/12/20 117.7 kg     Intake/Output Summary (Last 24 hours) at 02/05/2020 1850 Last data filed at 02/05/2020 1800 Gross per 24 hour  Intake 1560 ml  Output 1225 ml  Net 335 ml    Physical Exam:   Awake Alert, Oriented X 3, Normal affect No proptosis, no thyromegaly/goiter appreciated No new F.N deficits,  [place].AT, Normal respiratory effort on room air, crackles at bases, no conversational dyspnea Irregularly irregular, normal rate, no peripheral edema +ve B.Sounds, Abd Soft, No tenderness, No rebound, guarding or rigidity. No Cyanosis, No new Rash or bruise  Left hand grip decreased Left elbow  With slight edema,  less warm to touch, and not as tender as prior exam  I have personally reviewed the following:   Data Reviewed:  CBC Recent Labs  Lab 01/30/20 0210 01/30/20 0701 01/31/20 1002 02/01/20 0519 02/04/20 0537 02/05/20 0508  WBC 17.7*  --  10.6* 9.6 10.4 7.5  HGB 14.6 14.3 12.1* 13.1 12.7* 13.2  HCT 47.3 42.0 37.8* 40.6 39.9 41.7  PLT 315  --  247 255 251 266  MCV 91.5  --  89.4 87.5 87.5 88.9  MCH 28.2  --  28.6 28.2 27.9 28.1  MCHC 30.9  --  32.0 32.3 31.8 31.7  RDW 15.3  --  15.3 15.2 15.1 15.0  LYMPHSABS 2.9  --   --   --   --   --   MONOABS 1.4*  --   --   --   --   --   EOSABS 0.1  --   --   --   --   --   BASOSABS 0.1  --   --   --   --   --     Chemistries  Recent Labs  Lab 01/30/20 1038 01/31/20 1002 02/01/20 0519 02/02/20 0727 02/03/20 0823 02/04/20 0533 02/05/20 0508 02/05/20 1232 02/05/20 1544  NA  --  134* 135 133* 132* 131* 131*  --   --   K  --  4.3 4.3 5.1 4.6 4.8 5.5* 5.6* 5.3*  CL  --  103 101 100 98 97* 96*  --   --   CO2  --  16* 22 18* 22 19* 19*  --   --   GLUCOSE  --  135* 97 88 110* 94 156*  --   --   BUN  --  60* 58* 64* 71* 75* 87*  --   --   CREATININE  --  2.51* 2.21* 2.32* 2.37* 2.81* 3.08*  --   --   CALCIUM  --  8.9 9.4 9.3 9.6 9.5 9.4  --   --   AST 40 34  --   --   --   --   --   --   --   ALT 45* 41  --   --   --   --   --   --   --   ALKPHOS 100 84  --   --   --   --   --   --   --   BILITOT 1.5* 1.2  --   --   --   --   --   --   --    ------------------------------------------------------------------------------------------------------------------ Recent Labs  02/03/20 0602  CHOL 89  HDL 15*  LDLCALC 49  TRIG 125  CHOLHDL 5.9    Lab Results  Component Value Date   HGBA1C 7.7 (H) 12/23/2019   ------------------------------------------------------------------------------------------------------------------ No results for input(s): TSH, T4TOTAL, T3FREE, THYROIDAB in the last 72 hours.  Invalid input(s):  FREET3 ------------------------------------------------------------------------------------------------------------------ No results for input(s): VITAMINB12, FOLATE, FERRITIN, TIBC, IRON, RETICCTPCT in the last 72 hours.  Coagulation profile No results for input(s): INR, PROTIME in the last 168 hours.  Recent Labs    02/04/20 1255  DDIMER 1.30*    Cardiac Enzymes No results for input(s): CKMB, TROPONINI, MYOGLOBIN in the last 168 hours.  Invalid input(s): CK ------------------------------------------------------------------------------------------------------------------    Component Value Date/Time   BNP 74.1 01/30/2020 1038    Micro Results Recent Results (from the past 240 hour(s))  Urine culture     Status: Abnormal   Collection Time: 01/30/20  3:26 AM   Specimen: Urine, Random  Result Value Ref Range Status   Specimen Description   Final    URINE, RANDOM Performed at [facility] [address] [place], [place] [address]    Special Requests   Final    NONE Performed at [facility], [place] [address], [place], [place] [address]    Culture MULTIPLE SPECIES PRESENT, SUGGEST RECOLLECTION (A)  Final   Report Status 01/31/2020 FINAL  Final  Blood culture (routine x 2)     Status: None   Collection Time: 01/30/20  4:45 AM   Specimen: BLOOD RIGHT HAND  Result Value Ref Range Status   Specimen Description   Final    BLOOD RIGHT HAND Performed at [facility] [address], [place], [place] [address]    Special Requests   Final    BOTTLES DRAWN AEROBIC AND ANAEROBIC Blood Culture results may not be optimal due to an inadequate volume of blood received in culture bottles Performed at [facility] [address] [place], [place] [address]    Culture   Final    NO GROWTH 5 DAYS Performed at [facility], [place] [address], [place], [place] [address]    Report Status 02/04/2020 FINAL   Final  Blood culture (routine x 2)     Status: None   Collection Time: 01/30/20 10:38 AM   Specimen: BLOOD  Result Value Ref Range Status   Specimen Description   Final    BLOOD BLOOD LEFT FOREARM Performed at [facility] [address], [place], [place] [address]    Special Requests   Final    BOTTLES DRAWN AEROBIC AND ANAEROBIC Blood Culture adequate volume Performed at [facility] [address], [place], [place] [address]    Culture   Final    NO GROWTH 5 DAYS Performed at [facility], [place] [address] [place], [place] [address]    Report Status 02/04/2020 FINAL  Final  Culture, Urine     Status: Abnormal   Collection Time: 02/01/20  2:18 PM   Specimen: Urine, Random  Result Value Ref Range Status   Specimen Description   Final    URINE, RANDOM Performed at [facility] [address] [place], [place] [address]    Special Requests   Final    NONE Performed at [facility], [place] [address], [place], [place] [address]    Culture (A)  Final    50,000 COLONIES/mL KLEBSIELLA PNEUMONIAE Confirmed Extended Spectrum  Beta-Lactamase Producer (ESBL).  In bloodstream infections from ESBL organisms, carbapenems are preferred over piperacillin/tazobactam. They are shown to have a lower risk of mortality.    Report Status 02/03/2020 FINAL  Final   Organism ID, Bacteria KLEBSIELLA PNEUMONIAE (A)  Final      Susceptibility   Klebsiella pneumoniae - MIC*    AMPICILLIN >=32 RESISTANT Resistant     CEFAZOLIN >=64 RESISTANT Resistant     CEFEPIME >=32 RESISTANT Resistant     CEFTRIAXONE >=64 RESISTANT Resistant     CIPROFLOXACIN <=0.25 SENSITIVE Sensitive     GENTAMICIN <=1 SENSITIVE Sensitive     IMIPENEM <=0.25 SENSITIVE Sensitive     NITROFURANTOIN 128 RESISTANT Resistant     TRIMETH/SULFA >=320 RESISTANT Resistant     AMPICILLIN/SULBACTAM >=32 RESISTANT Resistant     PIP/TAZO 8 SENSITIVE  Sensitive     * 50,000 COLONIES/mL KLEBSIELLA PNEUMONIAE  SARS Coronavirus 2 by RT PCR (hospital order, performed in [facility] hospital lab) Nasopharyngeal Nasopharyngeal Swab     Status: None   Collection Time: 02/02/20 11:55 AM   Specimen: Nasopharyngeal Swab  Result Value Ref Range Status   SARS Coronavirus 2 NEGATIVE NEGATIVE Final    Comment: (NOTE) SARS-CoV-2 target nucleic acids are NOT DETECTED.  The SARS-CoV-2 RNA is generally detectable in upper and lower respiratory specimens during the acute phase of infection. The lowest concentration of SARS-CoV-2 viral copies this assay can detect is 250 copies / mL. A negative result does not preclude SARS-CoV-2 infection and should not be used as the sole basis for treatment or other patient management decisions.  A negative result may occur with improper specimen collection / handling, submission of specimen other than nasopharyngeal swab, presence of viral mutation(s) within the areas targeted by this assay, and inadequate number of viral copies (<250 copies / mL). A negative result must be combined with clinical observations, patient history, and epidemiological information.  Fact Sheet for Patients:   StrictlyIdeas.no  Fact Sheet for Healthcare Providers: BankingDealers.co.za  This test is not yet approved or  cleared by the [place] FDA and has been authorized for detection and/or diagnosis of SARS-CoV-2 by FDA under an Emergency Use Authorization (EUA).  This EUA will remain in effect (meaning this test can be used) for the duration of the COVID-19 declaration under Section 564(b)(1) of the Act, 21 U.S.C. section 360bbb-3(b)(1), unless the authorization is terminated or revoked sooner.  Performed at [facility], [place] [address] [place], [place] [address]     Radiology Reports DG Chest 2 View  Result Date: 01/30/2020 CLINICAL DATA:  Shortness of  breath and elevated white blood cell count EXAM: CHEST - 2 VIEW COMPARISON:  12/28/2019 FINDINGS: Cardiomegaly and vascular pedicle widening with aortic tortuosity. There is improved aeration since prior. Congested appearance of vessels without focal pneumonia. No visible effusion or pneumothorax. IMPRESSION: Cardiomegaly and vascular congestion. Aeration is improved since comparison last month. Electronically Signed   By: Monte Fantasia M.D.   On: 01/30/2020 05:12   DG Elbow 2 Views Left  Result Date: 02/03/2020 CLINICAL DATA:  Pain and swelling for 4 days, no known injury EXAM: LEFT ELBOW - 2 VIEW COMPARISON:  None. FINDINGS: There is no evidence of fracture, dislocation, or joint effusion. Moderate arthrosis. Soft tissue edema posteriorly. IMPRESSION: 1. No fracture or dislocation of the left elbow. Moderate arthrosis. 2.  Soft tissue edema posteriorly. Electronically Signed   By: [name] M.D.   On: 02/03/2020 11:39   DG  Hand 2 View Left  Result Date: 01/10/2020 CLINICAL DATA:  Left hand pain and swelling EXAM: LEFT HAND - 2 VIEW COMPARISON:  None. FINDINGS: There is no evidence of fracture or dislocation. There is no evidence of arthropathy or other focal bone abnormality. Soft tissues are unremarkable. IMPRESSION: Negative. Electronically Signed   By: Prudencio Pair M.D.   On: 01/10/2020 15:50   MR ELBOW LEFT WO CONTRAST  Result Date: 02/04/2020 CLINICAL DATA:  Elbow pain, soft tissue infection EXAM: MRI OF THE LEFT ELBOW WITHOUT CONTRAST TECHNIQUE: Multiplanar, multisequence MR imaging of the elbow was attempted. No intravenous contrast was administered. COMPARISON:  Left elbow x-ray 02/03/2020 FINDINGS: Nondiagnostic study as only a single axial T1 weighted sequence was performed. Patient refused any further imaging. On the included images there is evidence of subcutaneous soft tissue edema at the posterior aspect of the elbow and proximal forearm. There also appears to be an elbow joint  effusion. No fluid sensitive sequences were obtained. IMPRESSION: 1. Nondiagnostic study as only a single axial T1-weighted sequence was performed. Patient refused any further imaging. 2. On the included images, there is evidence of subcutaneous soft tissue edema at the posterior aspect of the elbow and proximal forearm and suggestion of an elbow joint effusion. If there is clinical concern for septic arthritis, an arthrocentesis should be performed. Electronically Signed   By: Davina Poke D.O.   On: 02/04/2020 14:17   US Venous Img Upper Uni Left (DVT)  Result Date: 01/07/2020 CLINICAL DATA:  Pain left arm x2 weeks EXAM: LEFT UPPER EXTREMITY VENOUS DOPPLER ULTRASOUND TECHNIQUE: Gray-scale sonography with graded compression, as well as color Doppler and duplex ultrasound were performed to evaluate the upper extremity deep venous system from the level of the subclavian vein and including the jugular, axillary, basilic, radial, ulnar and upper cephalic vein. Spectral Doppler was utilized to evaluate flow at rest and with distal augmentation maneuvers. COMPARISON:  None. FINDINGS: Contralateral Subclavian Vein: Respiratory phasicity is normal and symmetric with the symptomatic side. No evidence of thrombus. Normal compressibility. Internal Jugular Vein: No evidence of thrombus. Normal compressibility, respiratory phasicity and response to augmentation. Subclavian Vein: No evidence of thrombus. Normal compressibility, respiratory phasicity and response to augmentation. Axillary Vein: No evidence of thrombus. Normal compressibility, respiratory phasicity and response to augmentation. Cephalic Vein: No evidence of thrombus. Normal compressibility, respiratory phasicity and response to augmentation. Basilic Vein: No evidence of thrombus. Normal compressibility, respiratory phasicity and response to augmentation. Brachial Veins: No evidence of thrombus. Normal compressibility, respiratory phasicity and response to  augmentation. Radial Veins: No evidence of thrombus. Normal compressibility, respiratory phasicity and response to augmentation. Ulnar Veins: No evidence of thrombus. Normal compressibility, respiratory phasicity and response to augmentation. Other Findings:  None visualized. IMPRESSION: No evidence of DVT within the left upper extremity. Electronically Signed   By: Constance Holster M.D.   On: 01/07/2020 18:44   DG CHEST PORT 1 VIEW  Result Date: 02/04/2020 CLINICAL DATA:  Tachypnea, history of AFib. EXAM: PORTABLE CHEST 1 VIEW COMPARISON:  01/30/2020 and prior FINDINGS: No pneumothorax or pleural effusion. Patchy bilateral pulmonary opacities, unchanged. Cardiomegaly and central pulmonary vascular congestion similar to prior exam. No acute osseous abnormality. IMPRESSION: Bilateral pulmonary opacities, unchanged. Cardiomegaly and central pulmonary vascular congestion. Electronically Signed   By: Primitivo Gauze M.D.   On: 02/04/2020 13:19   EEG adult  Result Date: 01/30/2020 Lora Havens, MD     01/30/2020  8:55 PM Patient Name: CHASIN FINDLING MRN: [id number]  Epilepsy Attending: Lora Havens Referring Physician/Provider: Dr Cherylann Ratel Date: 01/30/2020 Duration: 24.26 mins Patient history: 54yo M with upper extremity shaking. EEG to evaluate for seizure Level of alertness: Awake,  Asleep AEDs during EEG study: None Technical aspects: This EEG study was done with scalp electrodes positioned according to the 10-20 International system of electrode placement. Electrical activity was acquired at a sampling rate of 500Hz  and reviewed with a high frequency filter of 70Hz  and a low frequency filter of 1Hz . EEG data were recorded continuously and digitally stored. Description: The posterior dominant rhythm consists of 8 Hz activity of moderate voltage (25-35 uV) seen predominantly in posterior head regions, symmetric and reactive to eye opening and eye closing. Sleep was characterized by vertex  waves, sleep spindles (12 to 14 Hz), maximal frontocentral region. Hyperventilation and photic stimulation were not performed.   IMPRESSION: This study is within normal limits. No seizures or epileptiform discharges were seen throughout the recording. Lora Havens   ECHOCARDIOGRAM COMPLETE  Result Date: 01/31/2020    ECHOCARDIOGRAM REPORT   Patient Name:   DEMPSY DAMIANO Date of Exam: 01/31/2020 Medical Rec #:  [id number]           Height:       71.0 in Accession #:    [id number]          Weight:       228.4 lb Date of Birth:  1965-08-31           BSA:          2.231 m Patient Age:    59 years            BP:           145/108 mmHg Patient Gender: M                   HR:           108 bpm. Exam Location:  Inpatient Procedure: 2D Echo, Cardiac Doppler, Color Doppler and Intracardiac            Opacification Agent Indications:    CHF-Acute Diastolic 132.44 / W10.27  History:        Patient has prior history of Echocardiogram examinations, most                 recent 12/03/2016. Cardiomyopathy, Arrythmias:Atrial                 Fibrillation and Atrial Flutter; Risk Factors:Hypertension,                 Dyslipidemia and Former Smoker. Elevated troponin.  Sonographer:    Vickie Epley RDCS Referring Phys: [id number] [facility] D Larhonda Dettloff  Sonographer Comments: Image acquisition challenging due to respiratory motion. IMPRESSIONS  1. Left ventricular ejection fraction, by estimation, is 45 to 50%. The left ventricle has normal function. The left ventricle demonstrates regional wall motion abnormalities. Abnormal (paradoxical) septal motion, consistent with left bundle branch block. Left ventricular diastolic parameters are indeterminate.  2. Right ventricular systolic function is normal. The right ventricular size is normal. Tricuspid regurgitation signal is inadequate for assessing PA pressure.  3. The mitral valve is normal in structure. No evidence of mitral valve regurgitation.  4. The aortic valve was not well  visualized. Aortic valve regurgitation is not visualized. No aortic stenosis is present.  5. The inferior vena cava is normal in size with greater than 50% respiratory variability, suggesting right atrial pressure of 3 mmHg. FINDINGS  Left Ventricle: Left ventricular ejection fraction, by estimation, is 45 to 50%. The left ventricle has normal function. The left ventricle demonstrates regional wall motion abnormalities. Definity contrast agent was given IV to delineate the left ventricular endocardial borders. The left ventricular internal cavity size was normal in size. There is no left ventricular hypertrophy. Abnormal (paradoxical) septal motion, consistent with left bundle branch block. Left ventricular diastolic parameters  are indeterminate. Right Ventricle: The right ventricular size is normal. Right vetricular wall thickness was not assessed. Right ventricular systolic function is normal. Tricuspid regurgitation signal is inadequate for assessing PA pressure. Left Atrium: Left atrial size was normal in size. Right Atrium: Right atrial size was normal in size. Pericardium: There is no evidence of pericardial effusion. Mitral Valve: The mitral valve is normal in structure. No evidence of mitral valve regurgitation. Tricuspid Valve: The tricuspid valve is normal in structure. Tricuspid valve regurgitation is not demonstrated. Aortic Valve: The aortic valve was not well visualized. Aortic valve regurgitation is not visualized. No aortic stenosis is present. Pulmonic Valve: The pulmonic valve was not well visualized. Pulmonic valve regurgitation is not visualized. Aorta: The aortic root is normal in size and structure. Venous: The inferior vena cava is normal in size with greater than 50% respiratory variability, suggesting right atrial pressure of 3 mmHg. IAS/Shunts: The interatrial septum was not well visualized.  LEFT VENTRICLE PLAX 2D LVIDd:         4.60 cm      Diastology LVIDs:         3.60 cm      LV e'  medial:    5.19 cm/s LV PW:         1.00 cm      LV E/e' medial:  10.8 LV IVS:        1.00 cm      LV e' lateral:   9.24 cm/s LVOT diam:     2.30 cm      LV E/e' lateral: 6.0 LV SV:         73 LV SV Index:   33 LVOT Area:     4.15 cm  LV Volumes (MOD) LV vol d, MOD A2C: 183.0 ml LV vol d, MOD A4C: 159.0 ml LV vol s, MOD A2C: 86.8 ml LV vol s, MOD A4C: 84.2 ml LV SV MOD A2C:     96.2 ml LV SV MOD A4C:     159.0 ml LV SV MOD BP:      83.4 ml RIGHT VENTRICLE RV S prime:     15.30 cm/s TAPSE (M-mode): 1.5 cm LEFT ATRIUM             Index       RIGHT ATRIUM           Index LA diam:        3.40 cm 1.52 cm/m  RA Area:     12.30 cm LA Vol (A2C):   39.2 ml 17.57 ml/m RA Volume:   26.40 ml  11.83 ml/m LA Vol (A4C):   37.7 ml 16.90 ml/m LA Biplane Vol: 42.5 ml 19.05 ml/m  AORTIC VALVE LVOT Vmax:   132.00 cm/s LVOT Vmean:  91.900 cm/s LVOT VTI:    0.175 m MITRAL VALVE MV Area (PHT): 4.49 cm    SHUNTS MV Decel Time: 169 msec    Systemic VTI:  0.18 m MV E velocity: 55.90 cm/s  Systemic Diam: 2.30 cm MV A velocity: 68.60 cm/s MV E/A ratio:  0.81  Oswaldo Milian MD Electronically signed by Oswaldo Milian MD Signature Date/Time: 01/31/2020/1:13:35 PM    Final      Time Spent in minutes  30     Desiree Hane M.D on 02/05/2020 at 6:50 PM  To page go to [email or web address] - password [facility]

## 2020-02-05 NOTE — Progress Notes (Signed)
Physical Therapy Treatment Patient Details Name: Hector [name] MRN: [id number] DOB: [birth year] Today's Date: 02/05/2020    History of Present Illness Hector [name] is a 54 y.o. year old male with medical history significant for atrial fibrillation on Eliquis, CHF, diabetes, CKD stage III who presented on 01/30/2020 with upper extremity tremors and was found to have UTI, acute on chronic diastolic CHF, and poorly controlled hypertension    PT Comments    General Comments: AxO x 3 and quick to say "I can't" and "no I'm not going to try" General bed mobility comments: patient  moved legs to bed edge, asking for 1 HHA to pull up to sitting.  Patient sitting on edge with LLE ER and flexed, did not turn enough to place foot to floor.  "Pull me up" stated pt.  Partially sat EOB with severe RIGHT lean.     Quickly, pt stated "No you can't hold me up" and layed himself down.  "I will start my therapy at that place".  Pt unable to recall SN[facility] name [name].  "Yeah that".   Follow Up Recommendations  SNF     Equipment Recommendations  None recommended by PT    Recommendations for Other Services       Precautions / Restrictions Precautions Precautions: Fall    Mobility  Bed Mobility Overal bed mobility: Needs Assistance Bed Mobility: Supine to Sit;Sit to Supine     Supine to sit: Mod assist Sit to supine: Min guard   General bed mobility comments: patient  moved legs to bed edge, asking for 1 HHA to pull up to sitting.  Patient sitting on edge with LLE ER and flexed, did not turn enough to place foot to floor.  "Pull me up" stated pt.  Partially sat EOB with severe RIGHT lean.     Quickly, pt stated "No you can't hold me up" and layed himself down.  "I will start my therapy at that place".  Pt unable to recall SNF name [name].  "Yeah that".  Transfers                 General transfer comment: pt declined any OOB attempt  Ambulation/Gait                  Stairs             Wheelchair Mobility    Modified Rankin (Stroke Patients Only)       Balance                                            Cognition Arousal/Alertness: Awake/alert                                     General Comments: AxO x 3 and quick to say "I can't" and "no I'm not going to try"      Exercises      General Comments        Pertinent Vitals/Pain Pain Assessment: No/denies pain    Home Living                      Prior Function            PT Goals (current goals can now be found in the care plan  section) Progress towards PT goals: Progressing toward goals    Frequency    Min 2X/week      PT Plan Current plan remains appropriate    Co-evaluation              AM-PAC PT "6 Clicks" Mobility   Outcome Measure  Help needed turning from your back to your side while in a flat bed without using bedrails?: A Lot Help needed moving from lying on your back to sitting on the side of a flat bed without using bedrails?: A Lot Help needed moving to and from a bed to a chair (including a wheelchair)?: Total Help needed standing up from a chair using your arms (e.g., wheelchair or bedside chair)?: Total Help needed to walk in hospital room?: Total Help needed climbing 3-5 steps with a railing? : Total 6 Click Score: 8    End of Session   Activity Tolerance: Patient tolerated treatment well Patient left: in bed;with call bell/phone within reach;with bed alarm set Nurse Communication: Mobility status PT Visit Diagnosis: Muscle weakness (generalized) (M62.81);Difficulty in walking, not elsewhere classified (R26.2)     Time: 1145-1200 PT Time Calculation (min) (ACUTE ONLY): 15 min  Charges:  $Therapeutic Activity: 8-22 mins                     {Shekela Goodridge  PTA Acute  Rehabilitation Services Pager      [phone] Office      [phone]

## 2020-02-05 NOTE — Plan of Care (Signed)
  Problem: Clinical Measurements: Goal: Cardiovascular complication will be avoided Outcome: Progressing   Problem: Nutrition: Goal: Adequate nutrition will be maintained Outcome: Progressing   Problem: Coping: Goal: Level of anxiety will decrease Outcome: Progressing   Problem: Elimination: Goal: Will not experience complications related to bowel motility Outcome: Progressing Goal: Will not experience complications related to urinary retention Outcome: Progressing   Problem: Pain Managment: Goal: General experience of comfort will improve Outcome: Progressing   Problem: Safety: Goal: Ability to remain free from injury will improve Outcome: Progressing   Problem: Skin Integrity: Goal: Risk for impaired skin integrity will decrease Outcome: Progressing

## 2020-02-05 NOTE — Progress Notes (Signed)
Notified MD on call about patient's change of weight from 103.9 kg to 101.4 kg within 24 hours.

## 2020-02-06 ENCOUNTER — Inpatient Hospital Stay (HOSPITAL_COMMUNITY): Payer: Medicaid Other

## 2020-02-06 LAB — URINALYSIS, ROUTINE W REFLEX MICROSCOPIC
Bilirubin Urine: NEGATIVE
Glucose, UA: NEGATIVE mg/dL
Hgb urine dipstick: NEGATIVE
Ketones, ur: NEGATIVE mg/dL
Nitrite: NEGATIVE
Protein, ur: 30 mg/dL — AB
Specific Gravity, Urine: 1.012 (ref 1.005–1.030)
pH: 5 (ref 5.0–8.0)

## 2020-02-06 LAB — CBC
HCT: 38.8 % — ABNORMAL LOW (ref 39.0–52.0)
Hemoglobin: 12.8 g/dL — ABNORMAL LOW (ref 13.0–17.0)
MCH: 28.4 pg (ref 26.0–34.0)
MCHC: 33 g/dL (ref 30.0–36.0)
MCV: 86 fL (ref 80.0–100.0)
Platelets: 309 10*3/uL (ref 150–400)
RBC: 4.51 MIL/uL (ref 4.22–5.81)
RDW: 14.9 % (ref 11.5–15.5)
WBC: 12.3 10*3/uL — ABNORMAL HIGH (ref 4.0–10.5)
nRBC: 0 % (ref 0.0–0.2)

## 2020-02-06 LAB — BASIC METABOLIC PANEL
Anion gap: 15 (ref 5–15)
BUN: 91 mg/dL — ABNORMAL HIGH (ref 6–20)
CO2: 20 mmol/L — ABNORMAL LOW (ref 22–32)
Calcium: 9.3 mg/dL (ref 8.9–10.3)
Chloride: 96 mmol/L — ABNORMAL LOW (ref 98–111)
Creatinine, Ser: 3.3 mg/dL — ABNORMAL HIGH (ref 0.61–1.24)
GFR, Estimated: 21 mL/min — ABNORMAL LOW (ref >60)
Glucose, Bld: 135 mg/dL — ABNORMAL HIGH (ref 70–99)
Potassium: 4.2 mmol/L (ref 3.5–5.1)
Sodium: 131 mmol/L — ABNORMAL LOW (ref 135–145)

## 2020-02-06 LAB — GLUCOSE, CAPILLARY
Glucose-Capillary: 138 mg/dL — ABNORMAL HIGH (ref 70–99)
Glucose-Capillary: 206 mg/dL — ABNORMAL HIGH (ref 70–99)
Glucose-Capillary: 211 mg/dL — ABNORMAL HIGH (ref 70–99)
Glucose-Capillary: 148 mg/dL — ABNORMAL HIGH (ref 70–99)

## 2020-02-06 LAB — POTASSIUM
Potassium: 4.5 mmol/L (ref 3.5–5.1)
Potassium: 4.6 mmol/L (ref 3.5–5.1)
Potassium: 5.1 mmol/L (ref 3.5–5.1)

## 2020-02-06 IMAGING — US US RENAL
1 series · 14 of 25 positions shown · non-contrast
Comparison: [DATE]

CLINICAL DATA: Acute kidney injury.

EXAM:
RENAL / URINARY TRACT ULTRASOUND COMPLETE

[Series 1: us renal · 14 of 56 slices shown]
[im 1/56]
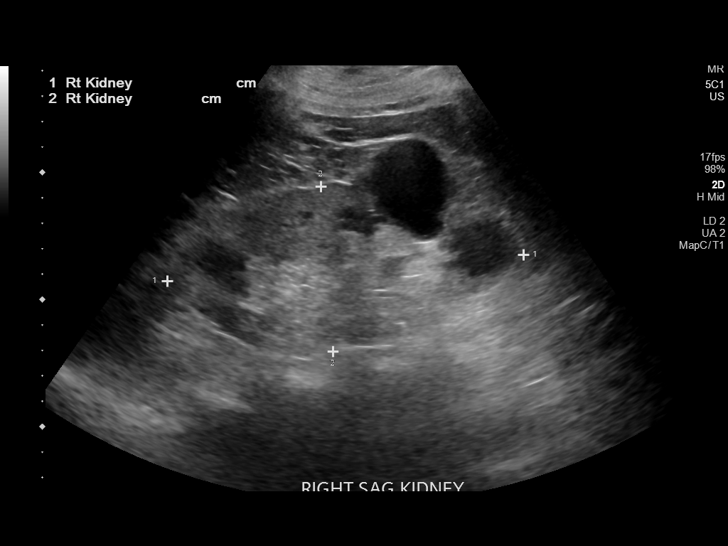
[im 5/56]
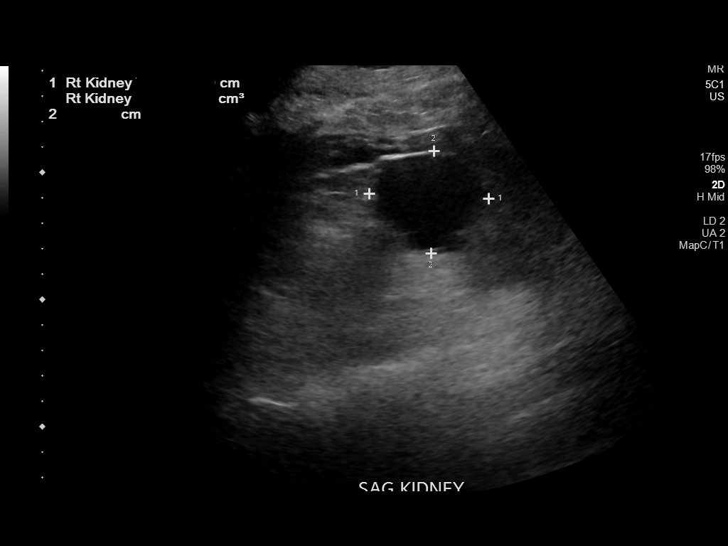
[im 10/56]
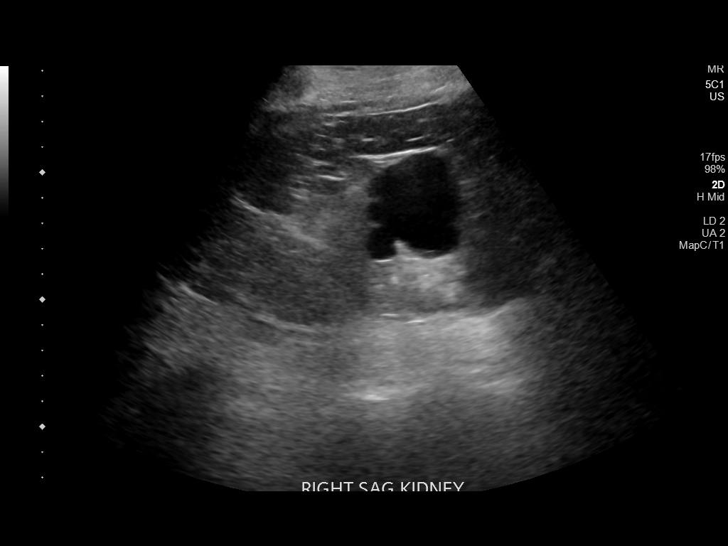
[im 14/56]
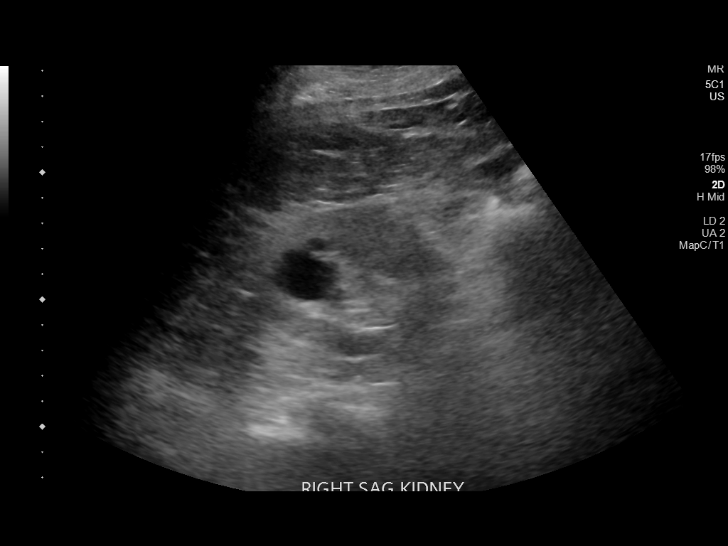
[im 19/56]
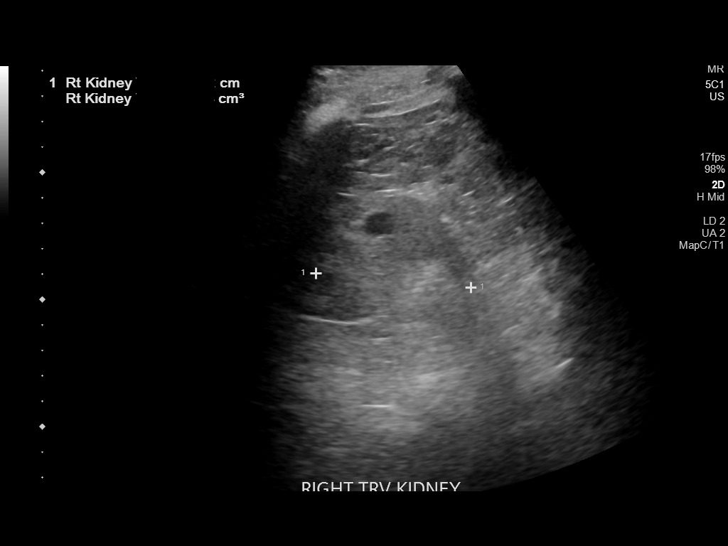
[im 21/56]
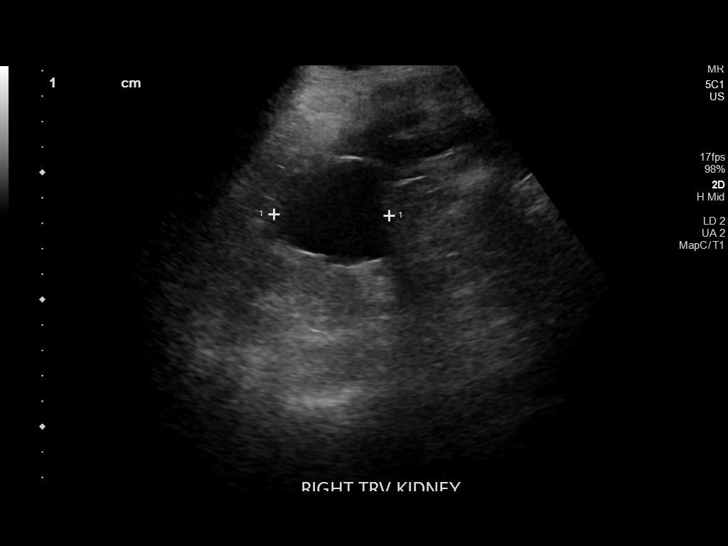
[im 26/56]
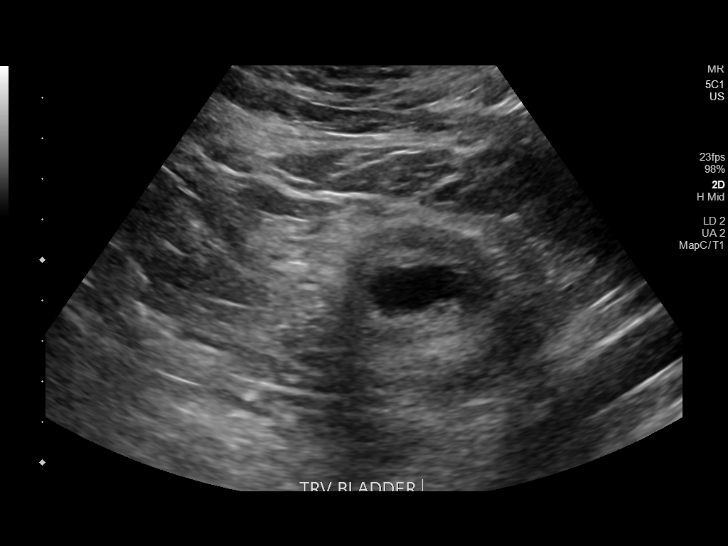
[im 30/56]
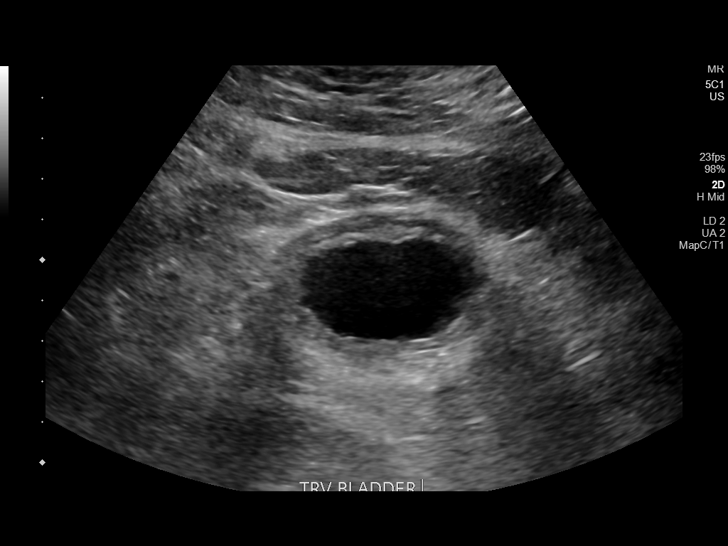
[im 35/56]
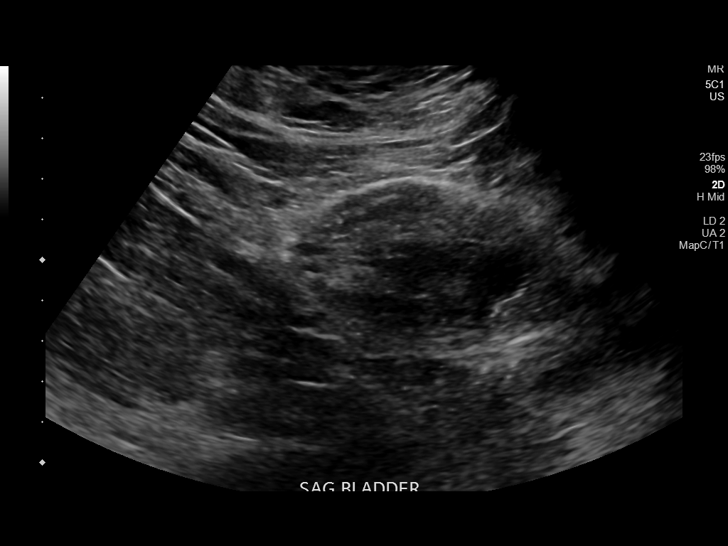
[im 37/56]
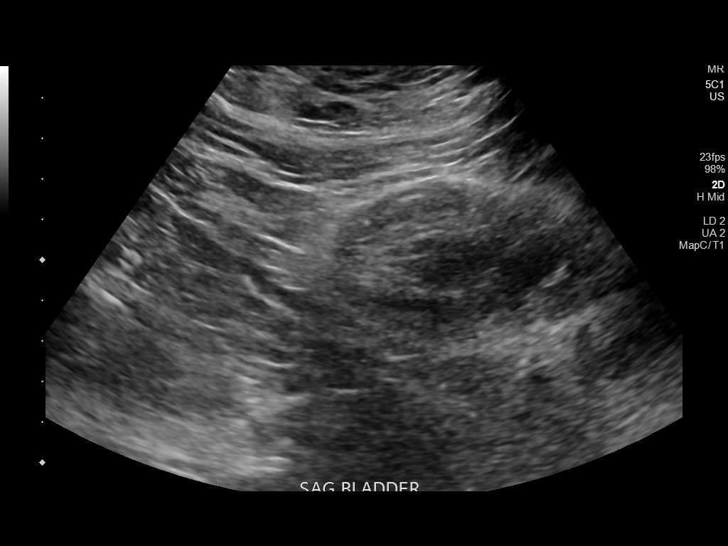
[im 42/56]
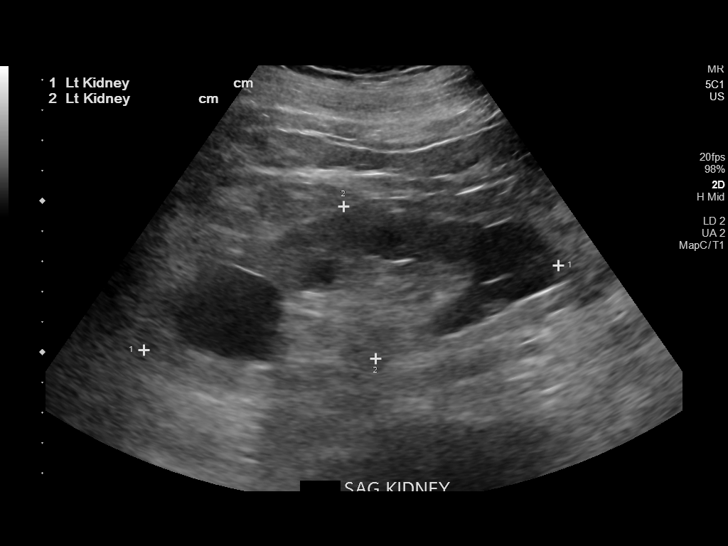
[im 46/56]
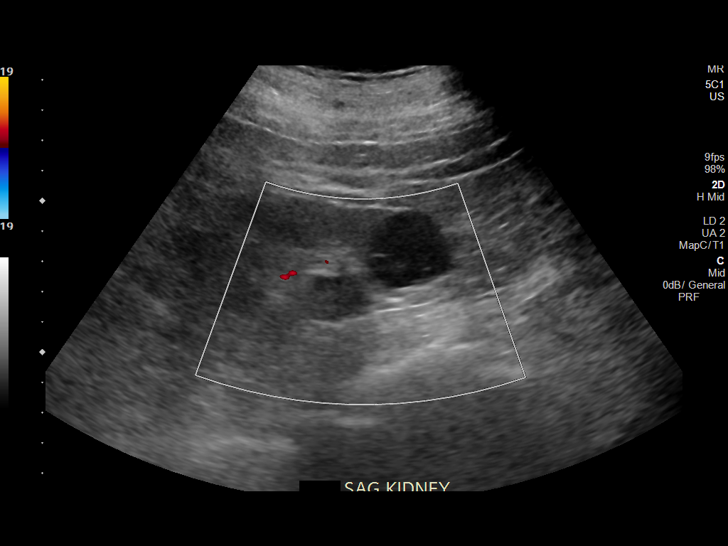
[im 51/56]
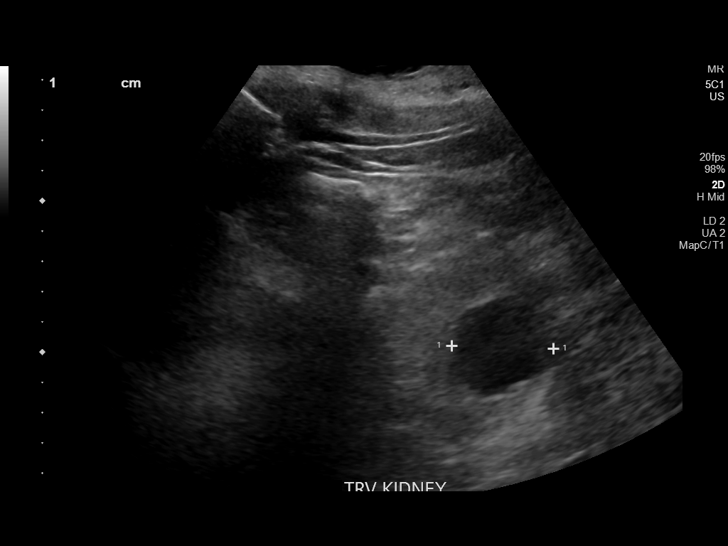
[im 56/56]
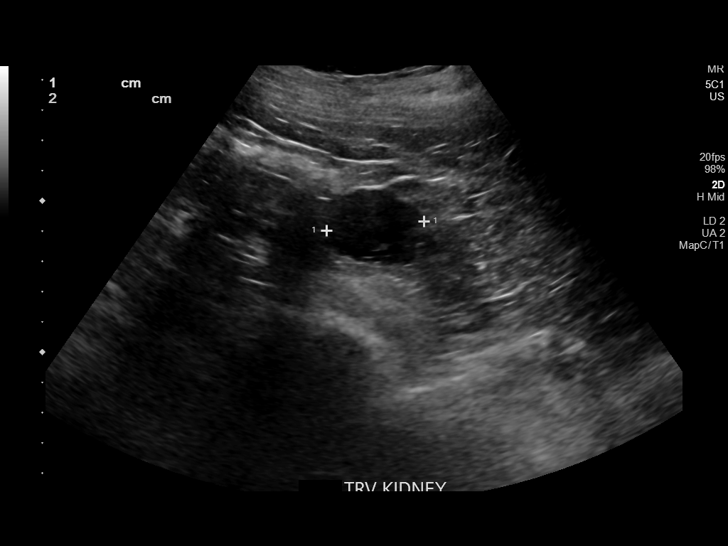

[14 of 25 positions shown; findings below may reference images not displayed]

FINDINGS: Right Kidney:

Renal measurements: 14.1 x 6.5 x 6.1 cm = volume: 293 mL. Stable
appearance of increased cortical echogenicity and multiple renal
cysts. The largest measures approximately 4.7 cm in greatest
diameter. All of the visualized cysts appear simple by ultrasound.
No hydronephrosis.

Left Kidney:

Renal measurements: 14.0 x 5.1 x 5.1 cm = volume: 190 mL. Stable
increased cortical echogenicity and multiple renal cysts. The
largest measures 3.4 cm in greatest diameter. All of the visualized
cysts appears simple by ultrasound. No hydronephrosis.

Bladder:

The bladder is mildly distended and appears thick walled which may
reflect chronic outlet obstruction.

Other:

None.
IMPRESSION: 1. Stable appearance of chronic kidney disease and bilateral renal
cysts. No evidence of hydronephrosis bilaterally.
2. Underdistended and thick walled bladder. Bladder wall thickening
may reflect chronic outlet obstruction.

## 2020-02-06 MED ORDER — SODIUM CHLORIDE 0.9 % IV SOLN: INTRAVENOUS | Status: DC

## 2020-02-06 NOTE — Plan of Care (Signed)
  Problem: Health Behavior/Discharge Planning: Goal: Ability to manage health-related needs will improve Outcome: Progressing   

## 2020-02-06 NOTE — Progress Notes (Signed)
[facility]  PROGRESS NOTE  Hector [name] HQP:[id number] DOB: 1965/03/10 DOA: 01/30/2020 PCP: [name], MD Admit date - 01/30/2020   Admitting Physician Jonnie Finner, DO  Outpatient Primary MD for the patient is [name], MD  LOS - 7 Brief Narrative   Hector [name] is a 54 y.o. year old male with medical history significant for atrial fibrillation on Eliquis, CHF, diabetes, CKD stage III who presented on 01/30/2020 with upper extremity tremors and was found to have UTI, acute on chronic diastolic CHF, and poorly controlled hypertension.  Hospital course: Patient was found to have acute CHF exacerbation which required IV Lasix, initial concern for UTI however patient continued to deny urinary symptoms and urine culture actually grew ESBL and at that time patient is only on ceftriaxone with no worsening clinical status which seem consistent with likely inaccurate (given as able random).  Course further complicated by elbow pain and swelling noted on MRI to be joint effusion presumed to be gout flare but unclear if infectious; given he improved with colchicine and prednisone seems more consistent with gout.  Orthopedics did attempt aspiration on 12/13 with no fluid able to be removed.  Currently patient is having further exacerbation of his CKD  Subjective  Reports significant improvement in elbow pain  A & P   Left elbow pain, with some swelling seem consistent with gout flare.  Improving on prednisone, status post colchicine on 12/12.  Uric acid 9. Doubt infection given remains afebrile, has white count but that in setting of steroid use, redness and tenderness has subsided significantly.  Ortho attempted aspiration unsuccessfully. -Supportive care with cool compresses, analgesics -status post colchicine on 12/12, continue prednisone 30 mg daily -- If becomes febrile or acutely decompensates overnight low threshold to discontinue steroids and add  empiric antibiotics   Evidence of hyperthyroidism in a patient with history of hypothyroidism. Suspect exogenous Synthroid is likely culprit. T3, free 16, free T4 4. Cardiology does not think amiodarone has induced this toxicity. Patient has low-grade tachycardia but otherwise no overt symptoms of hyperthyroidism. Receptor antibodies negative -Thyroid-stimulating immunoglobulins pending -Synthroid was initially discontinued, will restart at low dose -Will need repeat TSH, T3, T4 as outpatient - to monitor on telemetry  Acute on chronic diastolic CHF exacerbation, improving TTE shows preserved EF with indeterminate diastolic function on admission, repeat CXR on 12/12 with vascular congestion and tachypnea now resolved. - 3.9 L out this hospital stay, 900 cc out in the last 24 hours.  Looks euvolemic on exam --Given AKI was given diuresis holiday starting 12/13  -Monitor volume status -Daily weights, strict I's and O's -Closely monitor blood pressure -Continue Coreg, discontinue further use of Entresto as recommended by cardiology  Hypertension, at goal  -Continue amlodipine 10 mg,Coreg to 12.5 mg twice daily per cardiology,  --Added this hospitalization hydralazine to 50 mg TID -Entresto discontinued given renal insufficiency -IV hydralazine as needed  Reported tremors, resolved. 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  Initially suspected it could be  related to elevated BUN but this seems chronic in etiology and currently CKD is stable. EEG unremarkable -Discontinued lactulose --Continue to monitor  AKI on CKD stage 3 with hyperkalemia, continues to worsen.  Baseline creatinine prior to admission in November 2.2-2.4.  Creatinine of 2.9 on admission likely related to CHF exacerbation and poorly controlled blood pressure. Now with creatinine of 3.5.  Hyperkalemia resolved with Lokelma.  Differential includes nephrotoxin related  to recent  colchicine use as well as IV Lasix, not seeming to improve with diuresis holiday.  UA shows no dysmorphic cells. --Continue to hold home sacubitril/valsartan (has been on hold since admission) -Obtain renal ultrasound -Nephrology consulted, appreciate recommendations -Avoid further nephrotoxins -Monitor output --Discontinue all, given potassium x3 has remained at 4 -Monitor BMP   UTI secondary to Klebsiella pneumonia, resolved Denies any urinary symptoms. Urine sensitivity shows resistance to ceftriaxone. Had been on ceftriaxone throughout hospital stay without any fevers or complaints of urinary symptoms. Suspect poor sample, given random -Discontinue IV ceftriaxone, no need for antibiotics  Type 2 diabetes, well controlled A1c 7.7 (11/2019) -Sliding scale insulin, monitor CBG  Atrial fibrillation, currently rate controlled -monitor on telemetry -Continue amiodarone as recommended by cardiology and Eliquis  HLD, stable -continue statin  Left hand pain and history of nerve impingement during previous hospitalization back in November.  Patient states Voltaren gel does not help -Started low-dose gabapentin 100 mg 3 times daily, closely monitor   Family Communication  :  none  Code Status :  FULL  Disposition Plan  :  Patient is fromSNF Anticipated d/c date:  1-2 days. Barriers to d/c or necessity for inpatient status: diuresis holiday, monitor kidney function, prednisone for gout flare,  Consults  : Cardiology, Ortho  Procedures  :  TTE, 01/31/20  DVT Prophylaxis  :  eliquis  MDM: The below labs and imaging reports were reviewed and summarized above.  Medication management as above.  Lab Results  Component Value Date   PLT 309 02/06/2020    Diet :  Diet Order            Diet heart healthy/carb modified Room service appropriate? No; Fluid consistency: Thin  Diet effective now                  Inpatient Medications Scheduled Meds: . amiodarone  200 mg Oral Daily   . amLODipine  10 mg Oral Daily  . apixaban  5 mg Oral BID  . atorvastatin  80 mg Oral Daily  . carvedilol  6.25 mg Oral BID WC  . gabapentin  100 mg Oral TID  . hydrALAZINE  50 mg Oral Q8H  . insulin aspart  0-15 Units Subcutaneous TID WC  . insulin aspart  0-5 Units Subcutaneous QHS  . levothyroxine  50 mcg Oral Q0600   Continuous Infusions: . predniSONE     PRN Meds:.acetaminophen **OR** acetaminophen, hydrALAZINE, Muscle Rub  Antibiotics  :   Anti-infectives (From admission, onward)   Start     Dose/Rate Route Frequency Ordered Stop   01/31/20 0800  cefTRIAXone (ROCEPHIN) 1 g in sodium chloride 0.9 % 100 mL IVPB  Status:  Discontinued        1 g 200 mL/hr over 30 Minutes Intravenous Every 24 hours 01/30/20 1517 02/03/20 1245   01/30/20 0530  cefTRIAXone (ROCEPHIN) 1 g in sodium chloride 0.9 % 100 mL IVPB        1 g 200 mL/hr over 30 Minutes Intravenous  Once 01/30/20 0515 01/30/20 0811   01/30/20 0500  cefTRIAXone (ROCEPHIN) 1 g in sodium chloride 0.9 % 100 mL IVPB        1 g 200 mL/hr over 30 Minutes Intravenous  Once 01/30/20 0453 01/30/20 0621       Objective   Vitals:   02/06/20 1011 02/06/20 1158 02/06/20 1200 02/06/20 1335  BP: 124/88  136/86 136/86  Pulse:  92 92   Resp:  Marland Kitchen)  21 18   Temp:  98.4 F (36.9 C)    TempSrc:  Oral    SpO2:   92%   Weight:      Height:        SpO2: 92 %  Wt Readings from Last 3 Encounters:  02/06/20 102.7 kg  01/23/20 124.3 kg  01/12/20 117.7 kg     Intake/Output Summary (Last 24 hours) at 02/06/2020 1559 Last data filed at 02/06/2020 1334 Gross per 24 hour  Intake 960 ml  Output 825 ml  Net 135 ml    Physical Exam:   Awake Alert, Oriented X 3, Normal affect No proptosis, no thyromegaly/goiter appreciated No new F.N deficits,  Vance.AT, Normal respiratory effort on room air, no crackles at bases, no conversational dyspnea Irregularly irregular, normal rate, no peripheral edema +ve B.Sounds, Abd Soft, No  tenderness, No rebound, guarding or rigidity. No Cyanosis, No new Rash or bruise  Left hand grip decreased Left elbow  With slight edema, no warmth, no tenderness, improved range of motion  I have personally reviewed the following:   Data Reviewed:  CBC Recent Labs  Lab 01/31/20 1002 02/01/20 0519 02/04/20 0537 02/05/20 0508 02/06/20 0507  WBC 10.6* 9.6 10.4 7.5 12.3*  HGB 12.1* 13.1 12.7* 13.2 12.8*  HCT 37.8* 40.6 39.9 41.7 38.8*  PLT 247 255 251 266 309  MCV 89.4 87.5 87.5 88.9 86.0  MCH 28.6 28.2 27.9 28.1 28.4  MCHC 32.0 32.3 31.8 31.7 33.0  RDW 15.3 15.2 15.1 15.0 14.9    Chemistries  Recent Labs  Lab 01/31/20 1002 02/01/20 0519 02/02/20 0727 02/03/20 0823 02/04/20 0533 02/05/20 0508 02/05/20 1232 02/05/20 2025 02/06/20 0030 02/06/20 0507 02/06/20 0715 02/06/20 1242  NA 134*   < > 133* 132* 131* 131*  --   --   --  131*  --   --   K 4.3   < > 5.1 4.6 4.8 5.5*   < > 4.8 4.5 4.2 4.6 5.1  CL 103   < > 100 98 97* 96*  --   --   --  96*  --   --   CO2 16*   < > 18* 22 19* 19*  --   --   --  20*  --   --   GLUCOSE 135*   < > 88 110* 94 156*  --   --   --  135*  --   --   BUN 60*   < > 64* 71* 75* 87*  --   --   --  91*  --   --   CREATININE 2.51*   < > 2.32* 2.37* 2.81* 3.08*  --   --   --  3.30*  --   --   CALCIUM 8.9   < > 9.3 9.6 9.5 9.4  --   --   --  9.3  --   --   AST 34  --   --   --   --   --   --   --   --   --   --   --   ALT 41  --   --   --   --   --   --   --   --   --   --   --   ALKPHOS 84  --   --   --   --   --   --   --   --   --   --   --  BILITOT 1.2  --   --   --   --   --   --   --   --   --   --   --    < > = values in this interval not displayed.   ------------------------------------------------------------------------------------------------------------------ No results for input(s): CHOL, HDL, LDLCALC, TRIG, CHOLHDL, LDLDIRECT in the last 72 hours.  Lab Results  Component Value Date   HGBA1C 7.7 (H) 12/23/2019    ------------------------------------------------------------------------------------------------------------------ No results for input(s): TSH, T4TOTAL, T3FREE, THYROIDAB in the last 72 hours.  Invalid input(s): FREET3 ------------------------------------------------------------------------------------------------------------------ No results for input(s): VITAMINB12, FOLATE, FERRITIN, TIBC, IRON, RETICCTPCT in the last 72 hours.  Coagulation profile No results for input(s): INR, PROTIME in the last 168 hours.  Recent Labs    02/04/20 1255  DDIMER 1.30*    Cardiac Enzymes No results for input(s): CKMB, TROPONINI, MYOGLOBIN in the last 168 hours.  Invalid input(s): CK ------------------------------------------------------------------------------------------------------------------    Component Value Date/Time   BNP 74.1 01/30/2020 1038    Micro Results Recent Results (from the past 240 hour(s))  Urine culture     Status: Abnormal   Collection Time: 01/30/20  3:26 AM   Specimen: Urine, Random  Result Value Ref Range Status   Specimen Description   Final    URINE, RANDOM Performed at [facility] [address] [place], [place] [address]    Special Requests   Final    NONE Performed at [facility], [place] [address] [place], [place] [address]    Culture MULTIPLE SPECIES PRESENT, SUGGEST RECOLLECTION (A)  Final   Report Status 01/31/2020 FINAL  Final  Blood culture (routine x 2)     Status: None   Collection Time: 01/30/20  4:45 AM   Specimen: BLOOD RIGHT HAND  Result Value Ref Range Status   Specimen Description   Final    BLOOD RIGHT HAND Performed at [facility] [address], [place], [place] [address]    Special Requests   Final    BOTTLES DRAWN AEROBIC AND ANAEROBIC Blood Culture results may not be optimal due to an inadequate volume of blood received in culture bottles Performed at [facility] [address] [place], [place] [address]    Culture   Final    NO GROWTH 5 DAYS Performed at [facility], [place] [address], [place], [place] [address]    Report Status 02/04/2020 FINAL  Final  Blood culture (routine x 2)     Status: None   Collection Time: 01/30/20 10:38 AM   Specimen: BLOOD  Result Value Ref Range Status   Specimen Description   Final    BLOOD BLOOD LEFT FOREARM Performed at [facility] [address], [place], [place] [address]    Special Requests   Final    BOTTLES DRAWN AEROBIC AND ANAEROBIC Blood Culture adequate volume Performed at [facility] [address], [place], [place] [address]    Culture   Final    NO GROWTH 5 DAYS Performed at [facility], [place] [address], [place],  [address]    Report Status 02/04/2020 FINAL  Final  Culture, Urine     Status: Abnormal   Collection Time: 02/01/20  2:18 PM   Specimen: Urine, Random  Result Value Ref Range Status   Specimen Description   Final    URINE, RANDOM Performed at [facility] [address]  [place], [place] [address]    Special Requests   Final    NONE Performed at [facility], [place] [address], [place], [place] [address]    Culture (A)  Final    50,000 COLONIES/mL KLEBSIELLA PNEUMONIAE Confirmed Extended Spectrum Beta-Lactamase Producer (ESBL).  In bloodstream infections from ESBL organisms, carbapenems are preferred over piperacillin/tazobactam. They are shown to have a lower risk of mortality.    Report Status 02/03/2020 FINAL  Final   Organism ID, Bacteria KLEBSIELLA PNEUMONIAE (A)  Final      Susceptibility   Klebsiella pneumoniae - MIC*    AMPICILLIN >=32 RESISTANT Resistant     CEFAZOLIN >=64 RESISTANT Resistant     CEFEPIME >=32 RESISTANT Resistant     CEFTRIAXONE >=64 RESISTANT Resistant     CIPROFLOXACIN <=0.25 SENSITIVE Sensitive     GENTAMICIN <=1 SENSITIVE  Sensitive     IMIPENEM <=0.25 SENSITIVE Sensitive     NITROFURANTOIN 128 RESISTANT Resistant     TRIMETH/SULFA >=320 RESISTANT Resistant     AMPICILLIN/SULBACTAM >=32 RESISTANT Resistant     PIP/TAZO 8 SENSITIVE Sensitive     * 50,000 COLONIES/mL KLEBSIELLA PNEUMONIAE  SARS Coronavirus 2 by RT PCR (hospital order, performed in [facility] hospital lab) Nasopharyngeal Nasopharyngeal Swab     Status: None   Collection Time: 02/02/20 11:55 AM   Specimen: Nasopharyngeal Swab  Result Value Ref Range Status   SARS Coronavirus 2 NEGATIVE NEGATIVE Final    Comment: (NOTE) SARS-CoV-2 target nucleic acids are NOT DETECTED.  The SARS-CoV-2 RNA is generally detectable in upper and lower respiratory specimens during the acute phase of infection. The lowest concentration of SARS-CoV-2 viral copies this assay can detect is 250 copies / mL. A negative result does not preclude SARS-CoV-2 infection and should not be used as the sole basis for treatment or other patient management decisions.  A negative result may occur with improper specimen collection / handling, submission of specimen other than nasopharyngeal swab, presence of viral mutation(s) within the areas targeted by this assay, and inadequate number of viral copies (<250 copies / mL). A negative result must be combined with clinical observations, patient history, and epidemiological information.  Fact Sheet for Patients:   StrictlyIdeas.no  Fact Sheet for Healthcare Providers: BankingDealers.co.za  This test is not yet approved or  cleared by the [place] FDA and has been authorized for detection and/or diagnosis of SARS-CoV-2 by FDA under an Emergency Use Authorization (EUA).  This EUA will remain in effect (meaning this test can be used) for the duration of the COVID-19 declaration under Section 564(b)(1) of the Act, 21 U.S.C. section 360bbb-3(b)(1), unless the authorization is  terminated or revoked sooner.  Performed at [facility], [place] [address] [place],  [address]     Radiology Reports DG Chest 2 View  Result Date: 01/30/2020 CLINICAL DATA:  Shortness of breath and elevated white blood cell count EXAM: CHEST - 2 VIEW COMPARISON:  12/28/2019 FINDINGS: Cardiomegaly and vascular pedicle widening with aortic tortuosity. There is improved aeration since prior. Congested appearance of vessels without focal pneumonia. No visible effusion or pneumothorax. IMPRESSION: Cardiomegaly and vascular congestion. Aeration is improved since comparison last month. Electronically Signed   By: Monte Fantasia M.D.   On: 01/30/2020 05:12   DG Elbow 2 Views Left  Result Date: 02/03/2020 CLINICAL DATA:  Pain and swelling for 4 days, no known injury EXAM: LEFT ELBOW - 2 VIEW COMPARISON:  None. FINDINGS: There is no evidence of fracture,  dislocation, or joint effusion. Moderate arthrosis. Soft tissue edema posteriorly. IMPRESSION: 1. No fracture or dislocation of the left elbow. Moderate arthrosis. 2.  Soft tissue edema posteriorly. Electronically Signed   By: [name] M.D.   On: 02/03/2020 11:39   US RENAL  Result Date: 02/06/2020 CLINICAL DATA:  Acute kidney injury. EXAM: RENAL / URINARY TRACT ULTRASOUND COMPLETE COMPARISON:  12/23/2019 FINDINGS: Right Kidney: Renal measurements: 14.1 x 6.5 x 6.1 cm = volume: 293 mL. Stable appearance of increased cortical echogenicity and multiple renal cysts. The largest measures approximately 4.7 cm in greatest diameter. All of the visualized cysts appear simple by ultrasound. No hydronephrosis. Left Kidney: Renal measurements: 14.0 x 5.1 x 5.1 cm = volume: 190 mL. Stable increased cortical echogenicity and multiple renal cysts. The largest measures 3.4 cm in greatest diameter. All of the visualized cysts appears simple by ultrasound. No hydronephrosis. Bladder: The bladder is mildly distended and appears thick walled  which may reflect chronic outlet obstruction. Other: None. IMPRESSION: 1. Stable appearance of chronic kidney disease and bilateral renal cysts. No evidence of hydronephrosis bilaterally. 2. Underdistended and thick walled bladder. Bladder wall thickening may reflect chronic outlet obstruction. Electronically Signed   By: Aletta Edouard M.D.   On: 02/06/2020 12:05   DG Hand 2 View Left  Result Date: 01/10/2020 CLINICAL DATA:  Left hand pain and swelling EXAM: LEFT HAND - 2 VIEW COMPARISON:  None. FINDINGS: There is no evidence of fracture or dislocation. There is no evidence of arthropathy or other focal bone abnormality. Soft tissues are unremarkable. IMPRESSION: Negative. Electronically Signed   By: Prudencio Pair M.D.   On: 01/10/2020 15:50   MR ELBOW LEFT WO CONTRAST  Result Date: 02/04/2020 CLINICAL DATA:  Elbow pain, soft tissue infection EXAM: MRI OF THE LEFT ELBOW WITHOUT CONTRAST TECHNIQUE: Multiplanar, multisequence MR imaging of the elbow was attempted. No intravenous contrast was administered. COMPARISON:  Left elbow x-ray 02/03/2020 FINDINGS: Nondiagnostic study as only a single axial T1 weighted sequence was performed. Patient refused any further imaging. On the included images there is evidence of subcutaneous soft tissue edema at the posterior aspect of the elbow and proximal forearm. There also appears to be an elbow joint effusion. No fluid sensitive sequences were obtained. IMPRESSION: 1. Nondiagnostic study as only a single axial T1-weighted sequence was performed. Patient refused any further imaging. 2. On the included images, there is evidence of subcutaneous soft tissue edema at the posterior aspect of the elbow and proximal forearm and suggestion of an elbow joint effusion. If there is clinical concern for septic arthritis, an arthrocentesis should be performed. Electronically Signed   By: Davina Poke D.O.   On: 02/04/2020 14:17   DG CHEST PORT 1 VIEW  Result Date:  02/04/2020 CLINICAL DATA:  Tachypnea, history of AFib. EXAM: PORTABLE CHEST 1 VIEW COMPARISON:  01/30/2020 and prior FINDINGS: No pneumothorax or pleural effusion. Patchy bilateral pulmonary opacities, unchanged. Cardiomegaly and central pulmonary vascular congestion similar to prior exam. No acute osseous abnormality. IMPRESSION: Bilateral pulmonary opacities, unchanged. Cardiomegaly and central pulmonary vascular congestion. Electronically Signed   By: Primitivo Gauze M.D.   On: 02/04/2020 13:19   EEG adult  Result Date: 01/30/2020 Lora Havens, MD     01/30/2020  8:55 PM Patient Name: Hector [name] MRN: [id number] Epilepsy Attending: Lora Havens Referring Physician/Provider: Dr Cherylann Ratel Date: 01/30/2020 Duration: 24.26 mins Patient history: 54yo M with upper extremity shaking. EEG to evaluate for seizure Level of alertness: Awake,  Asleep AEDs during EEG study: None Technical aspects: This EEG study was done with scalp electrodes positioned according to the 10-20 International system of electrode placement. Electrical activity was acquired at a sampling rate of 500Hz  and reviewed with a high frequency filter of 70Hz  and a low frequency filter of 1Hz . EEG data were recorded continuously and digitally stored. Description: The posterior dominant rhythm consists of 8 Hz activity of moderate voltage (25-35 uV) seen predominantly in posterior head regions, symmetric and reactive to eye opening and eye closing. Sleep was characterized by vertex waves, sleep spindles (12 to 14 Hz), maximal frontocentral region. Hyperventilation and photic stimulation were not performed.   IMPRESSION: This study is within normal limits. No seizures or epileptiform discharges were seen throughout the recording. Lora Havens   ECHOCARDIOGRAM COMPLETE  Result Date: 01/31/2020    ECHOCARDIOGRAM REPORT   Patient Name:   Hector [name] Date of Exam: 01/31/2020 Medical Rec #:  [id number]           Height:        71.0 in Accession #:    [id number]          Weight:       228.4 lb Date of Birth:  11-Jun-1965           BSA:          2.231 m Patient Age:    61 years            BP:           145/108 mmHg Patient Gender: M                   HR:           108 bpm. Exam Location:  Inpatient Procedure: 2D Echo, Cardiac Doppler, Color Doppler and Intracardiac            Opacification Agent Indications:    CHF-Acute Diastolic 620.35 / D97.41  History:        Patient has prior history of Echocardiogram examinations, most                 recent 12/03/2016. Cardiomyopathy, Arrythmias:Atrial                 Fibrillation and Atrial Flutter; Risk Factors:Hypertension,                 Dyslipidemia and Former Smoker. Elevated troponin.  Sonographer:    Vickie Epley RDCS Referring Phys: [id number] [facility] D Tryce Surratt  Sonographer Comments: Image acquisition challenging due to respiratory motion. IMPRESSIONS  1. Left ventricular ejection fraction, by estimation, is 45 to 50%. The left ventricle has normal function. The left ventricle demonstrates regional wall motion abnormalities. Abnormal (paradoxical) septal motion, consistent with left bundle branch block. Left ventricular diastolic parameters are indeterminate.  2. Right ventricular systolic function is normal. The right ventricular size is normal. Tricuspid regurgitation signal is inadequate for assessing PA pressure.  3. The mitral valve is normal in structure. No evidence of mitral valve regurgitation.  4. The aortic valve was not well visualized. Aortic valve regurgitation is not visualized. No aortic stenosis is present.  5. The inferior vena cava is normal in size with greater than 50% respiratory variability, suggesting right atrial pressure of 3 mmHg. FINDINGS  Left Ventricle: Left ventricular ejection fraction, by estimation, is 45 to 50%. The left ventricle has normal function. The left ventricle demonstrates regional wall motion abnormalities. Definity contrast agent was given IV to  delineate the left ventricular endocardial borders. The left ventricular internal cavity size was normal in size. There is no left ventricular hypertrophy. Abnormal (paradoxical) septal motion, consistent with left bundle branch block. Left ventricular diastolic parameters  are indeterminate. Right Ventricle: The right ventricular size is normal. Right vetricular wall thickness was not assessed. Right ventricular systolic function is normal. Tricuspid regurgitation signal is inadequate for assessing PA pressure. Left Atrium: Left atrial size was normal in size. Right Atrium: Right atrial size was normal in size. Pericardium: There is no evidence of pericardial effusion. Mitral Valve: The mitral valve is normal in structure. No evidence of mitral valve regurgitation. Tricuspid Valve: The tricuspid valve is normal in structure. Tricuspid valve regurgitation is not demonstrated. Aortic Valve: The aortic valve was not well visualized. Aortic valve regurgitation is not visualized. No aortic stenosis is present. Pulmonic Valve: The pulmonic valve was not well visualized. Pulmonic valve regurgitation is not visualized. Aorta: The aortic root is normal in size and structure. Venous: The inferior vena cava is normal in size with greater than 50% respiratory variability, suggesting right atrial pressure of 3 mmHg. IAS/Shunts: The interatrial septum was not well visualized.  LEFT VENTRICLE PLAX 2D LVIDd:         4.60 cm      Diastology LVIDs:         3.60 cm      LV e' medial:    5.19 cm/s LV PW:         1.00 cm      LV E/e' medial:  10.8 LV IVS:        1.00 cm      LV e' lateral:   9.24 cm/s LVOT diam:     2.30 cm      LV E/e' lateral: 6.0 LV SV:         73 LV SV Index:   33 LVOT Area:     4.15 cm  LV Volumes (MOD) LV vol d, MOD A2C: 183.0 ml LV vol d, MOD A4C: 159.0 ml LV vol s, MOD A2C: 86.8 ml LV vol s, MOD A4C: 84.2 ml LV SV MOD A2C:     96.2 ml LV SV MOD A4C:     159.0 ml LV SV MOD BP:      83.4 ml RIGHT VENTRICLE RV S  prime:     15.30 cm/s TAPSE (M-mode): 1.5 cm LEFT ATRIUM             Index       RIGHT ATRIUM           Index LA diam:        3.40 cm 1.52 cm/m  RA Area:     12.30 cm LA Vol (A2C):   39.2 ml 17.57 ml/m RA Volume:   26.40 ml  11.83 ml/m LA Vol (A4C):   37.7 ml 16.90 ml/m LA Biplane Vol: 42.5 ml 19.05 ml/m  AORTIC VALVE LVOT Vmax:   132.00 cm/s LVOT Vmean:  91.900 cm/s LVOT VTI:    0.175 m MITRAL VALVE MV Area (PHT): 4.49 cm    SHUNTS MV Decel Time: 169 msec    Systemic VTI:  0.18 m MV E velocity: 55.90 cm/s  Systemic Diam: 2.30 cm MV A velocity: 68.60 cm/s MV E/A ratio:  0.81 Oswaldo Milian MD Electronically signed by Oswaldo Milian MD Signature Date/Time: 01/31/2020/1:13:35 PM    Final      Time Spent in minutes  30     [facility]  Dallie Dad M.D on 02/06/2020 at 3:59 PM  To page go to [email or web address] - password [facility]

## 2020-02-06 NOTE — Consult Note (Addendum)
Renal Service Consult Note [facility]  Hector [name] 02/06/2020 Sol Blazing, MD Requesting Physician: Dr Lonny Prude, Chauncey Cruel   Reason for Consult: Renal failure HPI: The patient is a 54 y.o. year-old w/ hx of afib, DM, CKD 3b f/b [facility]. Hx of etoh abuse and ETOH cardiomyopathy. Pt was hospitalized 10/29 - 01/19/20 for sig COVID pna and was dc'd to [place]. Lived in [place] prior to that. Pt admitted on 12/7 w/ jerking of extremities, high K+ and ^BUN/ Cr.  He rec'd some IVF's. CXR read as vasc congestion/ infiltrates. Fluids were held then and lasix given last 3-4 days and creat is now rising. Asked to see for renal failure.   Pt is w/o c/o's today, doesn't know much about details of medical hx.  Lying flat, no distress or cough.    ROS  denies CP  no joint pain   no HA  no blurry vision  no rash  no diarrhea  no nausea/ vomiting  no dysuria  no difficulty voiding  no change in urine color    Past Medical History  Past Medical History:  Diagnosis Date  . Alcohol abuse   . 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 12/2007 MV: EF 28%, no isch;  b. 8/12 Echo: EF 25-35%; c. 02/2014 Echo: EF 20-25%; d. 12/2014 Cath: minimal CAD; e. 01/2015 Echo: EF 50-55%;  d. 05/2016 Echo: EF 30-35%, diff HK, gr2 DD; e. 11/2016 Echo: EF 45-50%, diff HK.  . Chronic combined systolic (congestive) and diastolic (congestive) heart failure ([place])    a. 05/2016 Most recent Echo: EF 30-35%, diff HK, Gr2 DD, mild MR, mod dil LA; b. 11/2016 Echo: EF 45-50%, diff HK.  . CKD (chronic kidney disease), stage III ([place])   . Elevated troponin    Chronically elevated. - level was 0.17-0.10 during recent admission  . Essential hypertension   . GI bleed 11/2013  . Hyperlipidemia   . Pancreatitis   . Paroxysmal A-fib ([place])    a. new onset s/p unsuccessful TEE/DCCV on 08/16/2014; b. on amio/eliquis (CHA2DS2VASc = 2-3); c. reports 1-2 hrs of afib ~ q73mo.  . Paroxysmal atrial flutter  ([place]    a. new onset 07/2014; b. s/p unsuccessful TEE/DCCV 08/16/2014; c. on apixaban.  . Sleep-disordered breathing    Has yet to have a sleep study   Past Surgical History  Past Surgical History:  Procedure Laterality Date  . CARDIAC CATHETERIZATION N/A 01/09/2015   Procedure: Left Heart Cath and Coronary Angiography;  Surgeon: DLeonie Man MD;  [facility];  Service: Cardiovascular;  Laterality: N/A;  . ELECTROPHYSIOLOGIC STUDY N/A 08/16/2014   Procedure: CARDIOVERSION;  Surgeon: TMinna Merritts MD;  Location: [facility] ORS;  Service: Cardiovascular;  Laterality: N/A;  . FLEXIBLE SIGMOIDOSCOPY N/A 10/10/2015   Procedure: FLEXIBLE SIGMOIDOSCOPY;  Surgeon: MLollie Sails MD;  [facility];  Service: Endoscopy;  Laterality: N/A;  . KNEE SURGERY Right   . NM MYOVIEW LTD  November 2011   No ischemia or infarction. EF 50-55% ( no improvement from 2009 Myoview EF of 28%  . TEE WITHOUT CARDIOVERSION N/A 08/16/2014   Procedure: TRANSESOPHAGEAL ECHOCARDIOGRAM (TEE);  Surgeon: TMinna Merritts MD;  Location: [facility] ORS;  Service: Cardiovascular;  Laterality: N/A;   Family History  Family History  Problem Relation Age of Onset  . Hypertension Mother   . Hyperlipidemia Mother   . Diabetes Mother    Social History  reports that he quit smoking about  21 years ago. He has a 12.00 pack-year smoking history. He has never used smokeless tobacco. He reports that he does not drink alcohol and does not use drugs. Allergies  Allergies  Allergen Reactions  . Esomeprazole Magnesium Other (See Comments)    Suspected interstitial nephritis 2018  . Eggs Or Egg-Derived Products Rash   Home medications Prior to Admission medications   Medication Sig Start Date End Date Taking? Authorizing Provider  allopurinol (ZYLOPRIM) 100 MG tablet Take 100 mg by mouth daily as needed.   Yes [provider]  amiodarone (PACERONE) 200 MG tablet Take 1 tablet (200 mg total) by  mouth daily. 01/03/18  Yes Hackney, Otila Kluver A, FNP  amLODipine (NORVASC) 10 MG tablet Take 1 tablet (10 mg total) by mouth daily. 01/20/20  Yes Enzo Bi, MD  apixaban (ELIQUIS) 5 MG TABS tablet Take 1 tablet (5 mg total) by mouth 2 (two) times daily. 12/01/18  Yes Hackney, Otila Kluver A, FNP  atorvastatin (LIPITOR) 80 MG tablet Take 1 tablet (80 mg total) by mouth daily. 03/15/18  Yes Alisa Graff, FNP  carvedilol (COREG) 3.125 MG tablet Take 1 tablet (3.125 mg total) by mouth 2 (two) times daily with a meal. 03/15/18  Yes Darylene Price A, FNP  diclofenac Sodium (VOLTAREN) 1 % GEL Apply 2 g topically 2 (two) times daily. To left elbow, left forearm. 01/19/20  Yes Enzo Bi, MD  famotidine (PEPCID) 20 MG tablet Take 20 mg by mouth daily.   Yes [provider]  furosemide (LASIX) 40 MG tablet Take 1 tablet (40 mg total) by mouth daily as needed for fluid or edema. Per nephrology recommendation. 01/03/20  Yes Enzo Bi, MD  levothyroxine (SYNTHROID, LEVOTHROID) 100 MCG tablet Take 100 mcg by mouth daily before breakfast.   Yes [provider]  Menthol, Topical Analgesic, (BIOFREEZE ROLL-ON COLORLESS) 4 % GEL Apply 1 application topically daily.   Yes [provider]  Multiple Vitamin (MULTIVITAMIN WITH MINERALS) TABS tablet Take 1 tablet by mouth daily. 01/20/20  Yes Enzo Bi, MD  Nutritional Supplements (FEEDING SUPPLEMENT, NEPRO CARB STEADY,) LIQD Take 237 mLs by mouth 3 (three) times daily between meals. 01/19/20  Yes Enzo Bi, MD  sacubitril-valsartan (ENTRESTO) 97-103 MG Hold until outpatient followup with cardiology due to acute kidney injury. Patient not taking: Reported on 01/30/2020 01/03/20   Enzo Bi, MD     Vitals:   02/06/20 1158 02/06/20 1200 02/06/20 1335 02/06/20 1607  BP:  136/86 136/86 138/89  Pulse: 92 92  96  Resp: (!) 21 18    Temp: 98.4 F (36.9 C)     TempSrc: Oral     SpO2:  92%    Weight:      Height:       Exam Gen alert, no jvd No rash,  cyanosis or gangrene Sclera anicteric, throat clear  No jvd or bruits Chest clear bilat to bases no rales, occ rhonchi Cor reg no MRG Abd soft ntnd no mass or ascites +bs GU normal male  MS no joint effusions or deformity Ext  No leg or UE edema, no wounds or ulcers Neuro is alert, Ox 3 , nf     Date  Creat  eGFR   2013  1.6- 1.9   2015- 16 1.8- 2.4   2017- 18 1.90- 2.60   2019  1.8- 2.3 41- 47, IIIa   2020  2.29  41, IIIb   Nov 2021 2.2- 3.0 19- 35, IIIb   Dec 7  2.79  26   Dec 14 3.30  21   Home meds:  - amiodarone 200 qd/ norvasc 10/ coreg 3.125 bid/ lasix 40 qd prn/ entresto on hold  - synthroid qd/ lipitor qd/ allopurinol 100 qd  - prn's/ vitamins/ supplements    UA 12/14 - negative prot 30   Renal US - IMPRESSION: 1. Stable appearance of chronic kidney disease (14/ 14cm) and bilateral renal cysts. No evidence of hydronephrosis bilaterally.  2. Underdistended and thick walled bladder. Bladder wall thickening may reflect chronic outlet obstruction.     Last ECHO 12/8 - LVEF 35-40%   Assessment/ Plan: 1. Acute on CKD IIIB - b/l creat 2.2- 3.0 in Nov 2021 during [place] stay. Cause of AKI is lasix given for vol overload/ CXR changes but CXR changes are leftover infiltrates from pt's severe COVID infection and not representing CHF. Will dc lasix and start IVF's. Pt lying flat and no edema on no oxygen. Will follow.  2. ETOH cardiomyopathy - as above, hold lasix 3. L elbow pain - per pmd, poss gout 4. Chron syst/ diast CHF - last LVEF 35-40% by ECHO. Don't think this is acute CHF, CXR findings are left over from COVID pna  5. HTN - on norvasc, coreg, hydralazine 6. Klebs UTI 7. Tremors - resolved 8. DM2 9. Atrial fib      Rob Jonnie Finner  MD 02/06/2020, 4:23 PM  Recent Labs  Lab 02/05/20 0508 02/06/20 0507  WBC 7.5 12.3*  HGB 13.2 12.8*   Recent Labs  Lab 02/05/20 0508 02/05/20 1232 02/06/20 0507 02/06/20 0715 02/06/20 1242  K 5.5*   < > 4.2 4.6 5.1  BUN 87*   --  91*  --   --   CREATININE 3.08*  --  3.30*  --   --   CALCIUM 9.4  --  9.3  --   --    < > = values in this interval not displayed.

## 2020-02-06 NOTE — Plan of Care (Signed)
  Problem: Health Behavior/Discharge Planning: Goal: Ability to manage health-related needs will improve Outcome: Progressing   Problem: Clinical Measurements: Goal: Respiratory complications will improve Outcome: Progressing Goal: Cardiovascular complication will be avoided Outcome: Progressing   Problem: Activity: Goal: Risk for activity intolerance will decrease Outcome: Progressing   Problem: Nutrition: Goal: Adequate nutrition will be maintained Outcome: Progressing

## 2020-02-07 DIAGNOSIS — I4892 Unspecified atrial flutter: Secondary | ICD-10-CM

## 2020-02-07 DIAGNOSIS — E782 Mixed hyperlipidemia: Secondary | ICD-10-CM

## 2020-02-07 LAB — GLUCOSE, CAPILLARY
Glucose-Capillary: 95 mg/dL (ref 70–99)
Glucose-Capillary: 144 mg/dL — ABNORMAL HIGH (ref 70–99)
Glucose-Capillary: 186 mg/dL — ABNORMAL HIGH (ref 70–99)
Glucose-Capillary: 186 mg/dL — ABNORMAL HIGH (ref 70–99)

## 2020-02-07 LAB — BASIC METABOLIC PANEL
Anion gap: 14 (ref 5–15)
BUN: 98 mg/dL — ABNORMAL HIGH (ref 6–20)
CO2: 20 mmol/L — ABNORMAL LOW (ref 22–32)
Calcium: 9.1 mg/dL (ref 8.9–10.3)
Chloride: 98 mmol/L (ref 98–111)
Creatinine, Ser: 2.85 mg/dL — ABNORMAL HIGH (ref 0.61–1.24)
GFR, Estimated: 25 mL/min — ABNORMAL LOW (ref >60)
Glucose, Bld: 109 mg/dL — ABNORMAL HIGH (ref 70–99)
Potassium: 4.2 mmol/L (ref 3.5–5.1)
Sodium: 132 mmol/L — ABNORMAL LOW (ref 135–145)

## 2020-02-07 LAB — RESP PANEL BY RT-PCR (FLU A&B, COVID) ARPGX2
Influenza A by PCR: NEGATIVE
Influenza B by PCR: NEGATIVE
SARS Coronavirus 2 by RT PCR: POSITIVE — AB

## 2020-02-07 LAB — CBC
HCT: 37.3 % — ABNORMAL LOW (ref 39.0–52.0)
Hemoglobin: 11.8 g/dL — ABNORMAL LOW (ref 13.0–17.0)
MCH: 28 pg (ref 26.0–34.0)
MCHC: 31.6 g/dL (ref 30.0–36.0)
MCV: 88.4 fL (ref 80.0–100.0)
Platelets: 282 10*3/uL (ref 150–400)
RBC: 4.22 MIL/uL (ref 4.22–5.81)
RDW: 15.2 % (ref 11.5–15.5)
WBC: 10.9 10*3/uL — ABNORMAL HIGH (ref 4.0–10.5)
nRBC: 0 % (ref 0.0–0.2)

## 2020-02-07 MED ORDER — GABAPENTIN 100 MG PO CAPS: 100.0000 mg | ORAL_CAPSULE | Freq: Three times a day (TID) | ORAL | Status: DC

## 2020-02-07 MED ORDER — CARVEDILOL 6.25 MG PO TABS: 6.2500 mg | ORAL_TABLET | Freq: Two times a day (BID) | ORAL | Status: DC

## 2020-02-07 MED ORDER — PREDNISONE 10 MG PO TABS: ORAL_TABLET | ORAL | 0 refills | Status: AC

## 2020-02-07 MED ORDER — LEVOTHYROXINE SODIUM 50 MCG PO TABS: 50.0000 ug | ORAL_TABLET | Freq: Every day | ORAL | Status: DC

## 2020-02-07 MED ORDER — HYDRALAZINE HCL 50 MG PO TABS: 50.0000 mg | ORAL_TABLET | Freq: Three times a day (TID) | ORAL | Status: DC

## 2020-02-07 NOTE — Progress Notes (Signed)
[name], male   DOB: [birth year], 54 y.o.   MRN: [id number]  Multiple ongoing medical issues/concerns States left elbow continuing to improve  Persist subacute/chronic left hand issues not noticeably improved with gabapentin - neurology follow up as outpatient as needed  Would wean of prednisone at 5mg  every 2 day interval and continue colchicine until further directed by his PCP as outpatient

## 2020-02-07 NOTE — Progress Notes (Signed)
[facility] Progress Note  Subjective: lying flat, no SOB , creat down w/ IVF"s  Vitals:   02/06/20 1607 02/06/20 2145 02/06/20 2215 02/07/20 0522  BP: 138/89 (!) 148/89 (!) 139/91 135/90  Pulse: 96  90 91  Resp:   20 18  Temp:   97.7 F (36.5 C) 98 F (36.7 C)  TempSrc:   Oral Oral  SpO2:   92% 91%  Weight:      Height:        Exam:   alert, nad   no jvd  Chest cta bilat  Cor reg no RG  Abd soft ntnd no ascites   Ext no LE edema   Alert, NF, ox3     Date               Creat               eGFR   2013              1.6- 1.9   2015- 16        1.8- 2.4   2017- 18        1.90- 2.60   2019              1.8- 2.3            41- 47, IIIa   2020              2.29                 41, IIIb   Nov 2021       2.2- 3.0            19- 35, IIIb   Dec 7             2.79                 26   Dec 14           3.30                 21   Home meds:  - amiodarone 200 qd/ norvasc 10/ coreg 3.125 bid/ lasix 40 qd prn/ entresto on hold  - synthroid qd/ lipitor qd/ allopurinol 100 qd  - prn's/ vitamins/ supplements    UA 12/14 - negative prot 30   Renal US - IMPRESSION: 1. Stable appearance of chronic kidney disease (14/ 14cm) and bilateral renal cysts. No evidence of hydronephrosis bilaterally.  2. Underdistended and thick walled bladder. Bladder wall thickening may reflect chronic outlet obstruction.     Last ECHO 12/8 - LVEF 35-40%   Assessment/ Plan: 1. Acute on CKD IIIB - b/l creat 2.2- 3.0 in Nov 2021 during [place] stay. F/B [facility] for CKD. Cause of AKI is diuresis used for abnormal CXR , but CXR changes are leftover infiltrates from pt's recent COVID infection and don't represent CHF. Pt was dry on exam. IVF's started yest and creat down today. Cont IVF"s, check lab in am versus potentially he could be discharged today. Defer to primary team.  He was on lasix 40 qd prn at home , does not need this now, no edema. After DC should f/u w/ his [facility] kidney doctors. Will sign  off.  2. ETOH cardiomyopathy - as above, holding prn lasix for now 3. L elbow pain - per pmd, poss gout 4. Chron syst/ diast CHF - last LVEF 35-40% by ECHO. As above.  5. HTN -  on norvasc, coreg, hydralazine 6. Klebs UTI 7. Tremors - resolved 8. DM2 9. Atrial fib     Rob Saleem Coccia 02/07/2020, 9:38 AM   Recent Labs  Lab 02/06/20 0507 02/06/20 0715 02/06/20 1242 02/07/20 0553  K 4.2   < > 5.1 4.2  BUN 91*  --   --  98*  CREATININE 3.30*  --   --  2.85*  CALCIUM 9.3  --   --  9.1  HGB 12.8*  --   --  11.8*   < > = values in this interval not displayed.   Inpatient medications: . amiodarone  200 mg Oral Daily  . amLODipine  10 mg Oral Daily  . apixaban  5 mg Oral BID  . atorvastatin  80 mg Oral Daily  . carvedilol  6.25 mg Oral BID WC  . gabapentin  100 mg Oral TID  . hydrALAZINE  50 mg Oral Q8H  . insulin aspart  0-15 Units Subcutaneous TID WC  . insulin aspart  0-5 Units Subcutaneous QHS  . levothyroxine  50 mcg Oral Q0600  . predniSONE  30 mg Oral Q breakfast   . sodium chloride 75 mL/hr at 02/07/20 0524   acetaminophen **OR** acetaminophen, hydrALAZINE, Muscle Rub

## 2020-02-07 NOTE — TOC Transition Note (Addendum)
Transition of Care [facility]) - CM/SW Discharge Note   Patient Details  Name: Hector [name] MRN: [id number] Date of Birth: 12/05/1965  [facility]) CM/SW Contact:  Dessa Phi, RN Phone Number: 02/07/2020, 12:05 PM   Clinical Narrative: d/c summary faxed to [facility] able to accept-awaiting covid results.Going to rm#[id number], tel# nsg report [phone]. PTAR for transport once ready.   12:21p-PTAR called-WILL CALL-nsg to call once covid results back.      Barriers to Discharge: No Barriers Identified   Patient Goals and CMS Choice Patient states their goals for this hospitalization and ongoing recovery are:: Return back to [place]   Choice offered to / list presented to : Patient  Discharge Placement   Existing PASRR number confirmed : 02/07/20          Patient [facility] bed at: [facility] Patient to be transferred to facility by: [place] Name of family member notified: [name] mother Patient and family notified of of transfer: 02/07/20  Discharge Plan and Services   Discharge Planning Services: CM Consult Post Acute Care Choice: [place]                               Social Determinants of Health (SDOH) Interventions     Readmission Risk Interventions Readmission Risk Prevention Plan 02/01/2020 01/02/2020  Transportation Screening Complete Complete  PCP or Specialist Appt within 5-7 Days Complete -  PCP or Specialist Appt within 3-5 Days - Complete  Home Care Screening Complete -  Medication Review (RN CM) Complete -  HRI or [place] - Complete  Social Work Consult for [place] Planning/Counseling - Complete  Palliative Care Screening - Not Applicable  Medication Review Press photographer) - Complete  Some recent data might be hidden

## 2020-02-07 NOTE — TOC Progression Note (Addendum)
Transition of Care [facility]) - Progression Note    Patient Details  Name: Hector [name] MRN: [id number] Date of Birth: Dec 25, 1965  [facility] Care [facility]) CM/SW Contact  Ilisa Hayworth, Juliann Pulse, RN Phone Number: 02/07/2020, 2:58 PM  Clinical Narrative:Per [facility] rep [place]-they have a SNF-[facility] [address] [place], [place] [address] [phone], going to covid unit. PTAR only goes 50 miles within insurance,beyond that is private pay, or LOG from hospital. [facility] rep Rolla Plate is trying to find their own transport. CM will have packet @ Nsg station awaiting transport company from [facility].Nsg updated.     4p-[facility] will not be able to transport patient today to [place] transport tomorrow in am by w/c transport-they will have clothes for patient.   Expected Discharge Plan: [place] Barriers to Discharge: No Barriers Identified  Expected Discharge Plan and Services Expected Discharge Plan: [place]   Discharge Planning Services: CM Consult Post Acute Care Choice: [place] Living arrangements for the past 2 months: [place] Expected Discharge Date: 02/07/20                                     Social Determinants of Health (SDOH) Interventions    Readmission Risk Interventions Readmission Risk Prevention Plan 02/01/2020 01/02/2020  Transportation Screening Complete Complete  PCP or Specialist Appt within 5-7 Days Complete -  PCP or Specialist Appt within 3-5 Days - Complete  Home Care Screening Complete -  Medication Review (RN CM) Complete -  HRI or [place] - Complete  Social Work Consult for [place] Planning/Counseling - Complete  Palliative Care Screening - Not Applicable  Medication Review Press photographer) - Complete  Some recent data might be hidden

## 2020-02-07 NOTE — Progress Notes (Signed)
Patients COVID test (for discharge to SNF) came back positive. Patient had COVID and tested positive Dec 22 2019. With admission to [facility] his COVID test was negative 02/02/20. Consulted Dr. Johnnye Sima ID, regarding isolation precaution requirements. Will not initiate airborne/contact isolation. Will continue current contact isolation while waiting for SNF bed for discharge.

## 2020-02-07 NOTE — TOC Progression Note (Signed)
Transition of Care [facility]) - Progression Note    Patient Details  Name: Hector [name] MRN: [id number] Date of Birth: [birth year]  [facility]) CM/SW Contact  Saniya Tranchina, Juliann Pulse, RN Phone Number: 02/07/2020, 1:22 PM  Clinical Narrative: Noted covid results + per Natasha [facility] to accept-they will check if another SNF can accept-await outcome.      Expected Discharge Plan: [place] Barriers to Discharge: No Barriers Identified  Expected Discharge Plan and Services Expected Discharge Plan: [place]   Discharge Planning Services: CM Consult Post Acute Care Choice: [place] Living arrangements for the past 2 months: [place] Expected Discharge Date: 02/07/20                                     Social Determinants of Health (SDOH) Interventions    Readmission Risk Interventions Readmission Risk Prevention Plan 02/01/2020 01/02/2020  Transportation Screening Complete Complete  PCP or Specialist Appt within 5-7 Days Complete -  PCP or Specialist Appt within 3-5 Days - Complete  Home Care Screening Complete -  Medication Review (RN CM) Complete -  HRI or [place] - Complete  Social Work Consult for [place] Planning/Counseling - Complete  Palliative Care Screening - Not Applicable  Medication Review Press photographer) - Complete  Some recent data might be hidden

## 2020-02-07 NOTE — TOC Progression Note (Addendum)
Transition of Care [facility]) - Progression Note    Patient Details  Name: Hector [name] MRN: [id number] Date of Birth: 1966/01/28  Transition of [facility]) CM/SW Contact  Ellianna Ruest, Juliann Pulse, RN Phone Number: 02/07/2020, 2:31 PM  Clinical Narrative:   No d/c today(covid +)[facility] is still checking on a SNF to accept that can accomodate covid +,likely tomorrow d/c. PTAR cancelled.   Expected Discharge Plan: [place] Barriers to Discharge: No Barriers Identified  Expected Discharge Plan and Services Expected Discharge Plan: [place]   Discharge Planning Services: CM Consult Post Acute Care Choice: [place] Living arrangements for the past 2 months: [place] Expected Discharge Date: 02/07/20                                     Social Determinants of Health (SDOH) Interventions    Readmission Risk Interventions Readmission Risk Prevention Plan 02/01/2020 01/02/2020  Transportation Screening Complete Complete  PCP or Specialist Appt within 5-7 Days Complete -  PCP or Specialist Appt within 3-5 Days - Complete  Home Care Screening Complete -  Medication Review (RN CM) Complete -  HRI or [place] - Complete  Social Work Consult for [place] Planning/Counseling - Complete  Palliative Care Screening - Not Applicable  Medication Review Press photographer) - Complete  Some recent data might be hidden

## 2020-02-07 NOTE — Discharge Summary (Signed)
Physician Discharge Summary  Hector [name] GEX:[id number] DOB: [birth year] DOA: 01/30/2020  PCP: [name], MD  Admit date: 01/30/2020 Discharge date: 02/07/2020  Admitted From: Skilled nursing facility  Discharge disposition: Skilled nursing facility   Recommendations for Outpatient Follow-Up:   . Follow up with your primary care provider at the skilled nursing facility in 3 to 5 days . Check CBC, BMP, magnesium in the next visit . Follow-up with nephrology at [facility]. . Medication doses have been changed, please take note.  Lasix has been discontinued at this time . Synthroid dose has been decreased.  Check T3-T4 TSH in 4 to 6 weeks. . Consider sliding scale insulin if needed for hyperglycemia since the patient is on steroids.  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  Diet recommendation: Low sodium, heart healthy.  Carbohydrate controlled  Wound care: None.  Code status: Full.   History of Present Illness:   Hector Doane Watlingtonis a 54 y.o.year old malewith medical history significant for atrial fibrillation on Eliquis, CHF, diabetes, history of Covid infection in the past CKD stage III who presented on 12/7/2021with upper extremity tremors and was found to have UTI, acute on chronic diastolic CHF, and poorly controlled hypertension.  Initially he had hyperkalemia with elevated BUN and creatinine levels.  Chest x-ray showed vascular congestion.  Patient was given [facility] and patient was admitted to the hospital.    During  hospitalization, patient required IV Lasix, initial concern for UTI however patient continued to deny urinary symptoms and urine culture actually grew ESBL and at that time patient is only on ceftriaxone with no worsening clinical status which seem consistent with likely inaccurate (given as able random). Course further complicated by elbow pain and swelling, noted on MRI to be joint effusion presumed to be gout flare but unclear if infectious; given he improved with colchicine and prednisone seems more consistent with gout. Orthopedics did attempt aspiration on 12/13 with no fluid able to be removed.    Hospital Course:   Following conditions were addressed during hospitalization as listed below,  AKI on CKD stage 3 with hyperkalemia. Baseline creatinine prior to admission in November 2.2-2.4. Creatinine of 2.9 on admission and had creatinine of 2.8 today.  Initially received diuretics.  Currently on hold.  Nephrology has recommended to discontinue Lasix and Entresto on discharge.  Patient was seen by nephrology Cardiology during hospitalization.  Renal ultrasound showed stable appearance of chronic kidney disease bilateral renal cyst.  Patient will need to follow-up with his nephrologist at [facility] on discharge.  Left elbow pain likely acute gouty flare.  Orthopedics has seen the patient.  Improved on prednisone  Uric acid of 9.  Unlikely to be infectious.Ortho attempted aspiration unsuccessfully. continue prednisone 30 mg daily with taper on discharge.  Exogenous hyperthyroidism in patient with hypothyroidism.  Likely secondary to excessive Synthroid.   TSH T3-T4 elevated.  Synthroid has been started on low-dose at this time.  Check T3-T4 TSH in 4 to 6 weeks.  Acute on chronic diastolic CHF exacerbation,improving.  Euvolemic at this time.TTE shows preserved  EF with indeterminate diastolic function.  Chest x-ray initially showed pulmonary vascular congestion.  Was given IV diuresis initially but  due to AKI diuretics were kept on hold.  Diuretics will be discontinued on discharge as per nephrology. Continue Coreg with increased dose.  Hydralazine has been added..  discontinue further use of Entresto as recommended by cardiology  Essential hypertension. Continue amlodipine 10 mg,Coreg to 6.5 mg twice daily, Hydralazine 50 mg 3 times daily added this admission.  Delene Loll has been discontinued.  Reported tremors, resolved.  Likely secondary to excessive thyroid hormone levels.  Improved at this time.   EEG unremarkable  UTI secondary to Klebsiella pneumoniae,  Resolved.  Received IV Rocephin during hospitalization.  Has been discontinued.  Type 2 diabetes, well controlled A1c 7.7 (11/2019) Prednisone taper on discharge.  Okay to consider sliding scale insulin if needed for hyperglycemia.  Atrial fibrillation, currently rate controlled Continue Coreg, Eliquis and amiodarone as per cardiology  Hyperlipidemia. Continue Lipitor.  Left hand pain and history of nerve impingement during previous hospitalization back in November.  Has been started on low-dose gabapentin 100 mg 3 times daily, closely monitor  Disposition.  At this time, patient is stable for disposition to skilled nursing facility.  Will need to follow-up with PCP and nephrology as outpatient.  Medical Consultants:    Cardiology  Orthopedics  Nephrology  Procedures:    TTE 01/31/2020  Subjective:   Today, patient was seen and examined at bedside.  Complains of mild hand pain.  Denies any shortness of breath cough fever.  Discharge Exam:   Vitals:   02/06/20 2215 02/07/20 0522  BP: (!) 139/91 135/90  Pulse: 90 91  Resp: 20 18  Temp: 97.7 F (36.5 C) 98 F (36.7 C)  SpO2: 92% 91%   Vitals:   02/06/20 1607 02/06/20 2145 02/06/20 2215 02/07/20 0522  BP: 138/89 (!) 148/89 (!) 139/91 135/90  Pulse: 96  90 91  Resp:   20 18  Temp:   97.7 F (36.5 C) 98 F (36.7 C)  TempSrc:   Oral Oral   SpO2:   92% 91%  Weight:      Height:       General: Alert awake, not in obvious distress HENT: pupils equally reacting to light,  No scleral pallor or icterus noted. Oral mucosa is moist.  Chest:  Clear breath sounds.  Diminished breath sounds bilaterally. No crackles or wheezes.  CVS: S1 &S2 heard. No murmur.  Regular rate and rhythm. Abdomen: Soft, nontender, nondistended.  Bowel sounds are heard.   Extremities: No cyanosis, clubbing or edema.  Peripheral pulses are palpable. Psych: Alert, awake and oriented, normal mood CNS:  No cranial nerve deficits.  Left hand grip decreased left elbow with mild edema but no erythema. Skin: Warm and dry.  No rashes noted.  The results of significant diagnostics from this hospitalization (including imaging, microbiology, ancillary and laboratory) are listed below for reference.     Diagnostic Studies:   DG Chest 2 View  Result Date: 01/30/2020 CLINICAL DATA:  Shortness of breath and elevated white blood cell count EXAM: CHEST - 2 VIEW COMPARISON:  12/28/2019 FINDINGS: Cardiomegaly and vascular pedicle widening with aortic tortuosity. There is improved aeration since prior. Congested appearance of vessels without focal pneumonia. No visible effusion or pneumothorax. IMPRESSION: Cardiomegaly and vascular congestion. Aeration is improved since comparison last month. Electronically Signed   By: Monte Fantasia M.D.   On: 01/30/2020 05:12   EEG adult  Result Date: 01/30/2020  Lora Havens, MD     01/30/2020  8:55 PM Patient Name: ZYMIR NAPOLI MRN: [id number] Epilepsy Attending: Lora Havens Referring Physician/Provider: Dr Cherylann Ratel Date: 01/30/2020 Duration: 24.26 mins Patient history: 54yo M with upper extremity shaking. EEG to evaluate for seizure Level of alertness: Awake,  Asleep AEDs during EEG study: None Technical aspects: This EEG study was done with scalp electrodes positioned according to the 10-20 International system of  electrode placement. Electrical activity was acquired at a sampling rate of 500Hz  and reviewed with a high frequency filter of 70Hz  and a low frequency filter of 1Hz . EEG data were recorded continuously and digitally stored. Description: The posterior dominant rhythm consists of 8 Hz activity of moderate voltage (25-35 uV) seen predominantly in posterior head regions, symmetric and reactive to eye opening and eye closing. Sleep was characterized by vertex waves, sleep spindles (12 to 14 Hz), maximal frontocentral region. Hyperventilation and photic stimulation were not performed.   IMPRESSION: This study is within normal limits. No seizures or epileptiform discharges were seen throughout the recording. Lora Havens   ECHOCARDIOGRAM COMPLETE  Result Date: 01/31/2020    ECHOCARDIOGRAM REPORT   Patient Name:   TREYSHAWN MULDREW Date of Exam: 01/31/2020 Medical Rec #:  [id number]           Height:       71.0 in Accession #:    [id number]          Weight:       228.4 lb Date of Birth:  01/05/1966           BSA:          2.231 m Patient Age:    65 years            BP:           145/108 mmHg Patient Gender: M                   HR:           108 bpm. Exam Location:  Inpatient Procedure: 2D Echo, Cardiac Doppler, Color Doppler and Intracardiac            Opacification Agent Indications:    CHF-Acute Diastolic 220.25 / K27.06  History:        Patient has prior history of Echocardiogram examinations, most                 recent 12/03/2016. Cardiomyopathy, Arrythmias:Atrial                 Fibrillation and Atrial Flutter; Risk Factors:Hypertension,                 Dyslipidemia and Former Smoker. Elevated troponin.  Sonographer:    Vickie Epley RDCS Referring Phys: [id number] [facility] D NETTEY  Sonographer Comments: Image acquisition challenging due to respiratory motion. IMPRESSIONS  1. Left ventricular ejection fraction, by estimation, is 45 to 50%. The left ventricle has normal function. The left ventricle demonstrates  regional wall motion abnormalities. Abnormal (paradoxical) septal motion, consistent with left bundle branch block. Left ventricular diastolic parameters are indeterminate.  2. Right ventricular systolic function is normal. The right ventricular size is normal. Tricuspid regurgitation signal is inadequate for assessing PA pressure.  3. The mitral valve is normal in structure. No evidence of mitral valve regurgitation.  4. The aortic valve was not well visualized. Aortic valve regurgitation is not visualized. No aortic stenosis is present.  5. The inferior vena  cava is normal in size with greater than 50% respiratory variability, suggesting right atrial pressure of 3 mmHg. FINDINGS  Left Ventricle: Left ventricular ejection fraction, by estimation, is 45 to 50%. The left ventricle has normal function. The left ventricle demonstrates regional wall motion abnormalities. Definity contrast agent was given IV to delineate the left ventricular endocardial borders. The left ventricular internal cavity size was normal in size. There is no left ventricular hypertrophy. Abnormal (paradoxical) septal motion, consistent with left bundle branch block. Left ventricular diastolic parameters  are indeterminate. Right Ventricle: The right ventricular size is normal. Right vetricular wall thickness was not assessed. Right ventricular systolic function is normal. Tricuspid regurgitation signal is inadequate for assessing PA pressure. Left Atrium: Left atrial size was normal in size. Right Atrium: Right atrial size was normal in size. Pericardium: There is no evidence of pericardial effusion. Mitral Valve: The mitral valve is normal in structure. No evidence of mitral valve regurgitation. Tricuspid Valve: The tricuspid valve is normal in structure. Tricuspid valve regurgitation is not demonstrated. Aortic Valve: The aortic valve was not well visualized. Aortic valve regurgitation is not visualized. No aortic stenosis is present.  Pulmonic Valve: The pulmonic valve was not well visualized. Pulmonic valve regurgitation is not visualized. Aorta: The aortic root is normal in size and structure. Venous: The inferior vena cava is normal in size with greater than 50% respiratory variability, suggesting right atrial pressure of 3 mmHg. IAS/Shunts: The interatrial septum was not well visualized.  LEFT VENTRICLE PLAX 2D LVIDd:         4.60 cm      Diastology LVIDs:         3.60 cm      LV e' medial:    5.19 cm/s LV PW:         1.00 cm      LV E/e' medial:  10.8 LV IVS:        1.00 cm      LV e' lateral:   9.24 cm/s LVOT diam:     2.30 cm      LV E/e' lateral: 6.0 LV SV:         73 LV SV Index:   33 LVOT Area:     4.15 cm  LV Volumes (MOD) LV vol d, MOD A2C: 183.0 ml LV vol d, MOD A4C: 159.0 ml LV vol s, MOD A2C: 86.8 ml LV vol s, MOD A4C: 84.2 ml LV SV MOD A2C:     96.2 ml LV SV MOD A4C:     159.0 ml LV SV MOD BP:      83.4 ml RIGHT VENTRICLE RV S prime:     15.30 cm/s TAPSE (M-mode): 1.5 cm LEFT ATRIUM             Index       RIGHT ATRIUM           Index LA diam:        3.40 cm 1.52 cm/m  RA Area:     12.30 cm LA Vol (A2C):   39.2 ml 17.57 ml/m RA Volume:   26.40 ml  11.83 ml/m LA Vol (A4C):   37.7 ml 16.90 ml/m LA Biplane Vol: 42.5 ml 19.05 ml/m  AORTIC VALVE LVOT Vmax:   132.00 cm/s LVOT Vmean:  91.900 cm/s LVOT VTI:    0.175 m MITRAL VALVE MV Area (PHT): 4.49 cm    SHUNTS MV Decel Time: 169 msec    Systemic VTI:  0.18 m  MV E velocity: 55.90 cm/s  Systemic Diam: 2.30 cm MV A velocity: 68.60 cm/s MV E/A ratio:  0.81 Oswaldo Milian MD Electronically signed by Oswaldo Milian MD Signature Date/Time: 01/31/2020/1:13:35 PM    Final      Labs:   Basic Metabolic Panel: Recent Labs  Lab 02/03/20 2952 02/04/20 0533 02/05/20 8413 02/05/20 1232 02/06/20 0507 02/06/20 0715 02/06/20 1242 02/07/20 0553  NA 132* 131* 131*  --  131*  --   --  132*  K 4.6 4.8 5.5*   < > 4.2   < > 5.1 4.2  CL 98 97* 96*  --  96*  --   --  98   CO2 22 19* 19*  --  20*  --   --  20*  GLUCOSE 110* 94 156*  --  135*  --   --  109*  BUN 71* 75* 87*  --  91*  --   --  98*  CREATININE 2.37* 2.81* 3.08*  --  3.30*  --   --  2.85*  CALCIUM 9.6 9.5 9.4  --  9.3  --   --  9.1   < > = values in this interval not displayed.   GFR Estimated Creatinine Clearance: 35.7 mL/min (A) (by C-G formula based on SCr of 2.85 mg/dL (H)). Liver Function Tests: No results for input(s): AST, ALT, ALKPHOS, BILITOT, PROT, ALBUMIN in the last 168 hours. No results for input(s): LIPASE, AMYLASE in the last 168 hours. No results for input(s): AMMONIA in the last 168 hours. Coagulation profile No results for input(s): INR, PROTIME in the last 168 hours.  CBC: Recent Labs  Lab 02/01/20 0519 02/04/20 0537 02/05/20 0508 02/06/20 0507 02/07/20 0553  WBC 9.6 10.4 7.5 12.3* 10.9*  HGB 13.1 12.7* 13.2 12.8* 11.8*  HCT 40.6 39.9 41.7 38.8* 37.3*  MCV 87.5 87.5 88.9 86.0 88.4  PLT 255 251 266 309 282   Cardiac Enzymes: No results for input(s): CKTOTAL, CKMB, CKMBINDEX, TROPONINI in the last 168 hours. BNP: Invalid input(s): POCBNP CBG: Recent Labs  Lab 02/06/20 0744 02/06/20 1156 02/06/20 1644 02/06/20 2145 02/07/20 0739  GLUCAP 138* 206* 211* 148* 95   D-Dimer Recent Labs    02/04/20 1255  DDIMER 1.30*   Hgb A1c No results for input(s): HGBA1C in the last 72 hours. Lipid Profile No results for input(s): CHOL, HDL, LDLCALC, TRIG, CHOLHDL, LDLDIRECT in the last 72 hours. Thyroid function studies No results for input(s): TSH, T4TOTAL, T3FREE, THYROIDAB in the last 72 hours.  Invalid input(s): FREET3 Anemia work up No results for input(s): VITAMINB12, FOLATE, FERRITIN, TIBC, IRON, RETICCTPCT in the last 72 hours. Microbiology Recent Results (from the past 240 hour(s))  Urine culture     Status: Abnormal   Collection Time: 01/30/20  3:26 AM   Specimen: Urine, Random  Result Value Ref Range Status   Specimen Description   Final     URINE, RANDOM Performed at [facility] [address] [place], [place] [address]    Special Requests   Final    NONE Performed at [facility], [place] [address], [place], [place] [address]    Culture MULTIPLE SPECIES PRESENT, SUGGEST RECOLLECTION (A)  Final   Report Status 01/31/2020 FINAL  Final  Blood culture (routine x 2)     Status: None   Collection Time: 01/30/20  4:45 AM   Specimen: BLOOD RIGHT HAND  Result Value Ref Range Status   Specimen Description  Final    BLOOD RIGHT HAND Performed at [facility], [place] [address], [place], [place] [address]    Special Requests   Final    BOTTLES DRAWN AEROBIC AND ANAEROBIC Blood Culture results may not be optimal due to an inadequate volume of blood received in culture bottles Performed at [facility] [address] [place], [place] [address]    Culture   Final    NO GROWTH 5 DAYS Performed at [facility], [place] [address] [place], [place] [address]    Report Status 02/04/2020 FINAL  Final  Blood culture (routine x 2)     Status: None   Collection Time: 01/30/20 10:38 AM   Specimen: BLOOD  Result Value Ref Range Status   Specimen Description   Final    BLOOD BLOOD LEFT FOREARM Performed at [facility] [address] [place], [place] [address]    Special Requests   Final    BOTTLES DRAWN AEROBIC AND ANAEROBIC Blood Culture adequate volume Performed at [facility] [address], [place], [place] [address]    Culture   Final    NO GROWTH 5 DAYS Performed at [facility], [place] [address] [place], [place] [address]    Report Status 02/04/2020 FINAL  Final  Culture, Urine     Status: Abnormal   Collection Time: 02/01/20  2:18 PM   Specimen: Urine, Random  Result Value Ref Range Status   Specimen Description   Final    URINE, RANDOM Performed at [facility] [address] [place], [place] [address]    Special Requests   Final    NONE Performed at [facility], [place] [address], [place],  [address]    Culture (A)  Final    50,000 COLONIES/mL KLEBSIELLA PNEUMONIAE Confirmed Extended Spectrum Beta-Lactamase Producer (ESBL).  In bloodstream infections from ESBL organisms, carbapenems are preferred over piperacillin/tazobactam. They are shown to have a lower risk of mortality.    Report Status 02/03/2020 FINAL  Final   Organism ID, Bacteria KLEBSIELLA PNEUMONIAE (A)  Final      Susceptibility   Klebsiella pneumoniae - MIC*    AMPICILLIN >=32 RESISTANT Resistant     CEFAZOLIN >=64 RESISTANT Resistant     CEFEPIME >=32 RESISTANT Resistant     CEFTRIAXONE >=64 RESISTANT Resistant     CIPROFLOXACIN <=0.25 SENSITIVE Sensitive     GENTAMICIN <=1 SENSITIVE Sensitive     IMIPENEM <=0.25 SENSITIVE Sensitive     NITROFURANTOIN 128 RESISTANT Resistant     TRIMETH/SULFA >=320 RESISTANT Resistant     AMPICILLIN/SULBACTAM >=32 RESISTANT Resistant     PIP/TAZO 8 SENSITIVE Sensitive     * 50,000 COLONIES/mL KLEBSIELLA PNEUMONIAE  SARS Coronavirus 2 by RT PCR (hospital order, performed in [facility] hospital lab) Nasopharyngeal Nasopharyngeal Swab     Status: None   Collection Time: 02/02/20 11:55 AM   Specimen: Nasopharyngeal Swab  Result Value Ref Range Status   SARS Coronavirus 2 NEGATIVE NEGATIVE Final    Comment: (NOTE) SARS-CoV-2 target nucleic acids are NOT DETECTED.  The SARS-CoV-2 RNA is generally detectable in upper and lower respiratory specimens during the acute phase of infection. The lowest concentration of SARS-CoV-2 viral copies this assay can detect is 250 copies / mL. A negative result does not preclude SARS-CoV-2 infection and should not be used as the sole basis for treatment or other patient management decisions.  A negative  result may occur with improper specimen collection / handling, submission of specimen other than  nasopharyngeal swab, presence of viral mutation(s) within the areas targeted by this assay, and inadequate number of viral copies (<250 copies / mL). A negative result must be combined with clinical observations, patient history, and epidemiological information.  Fact Sheet for Patients:   StrictlyIdeas.no  Fact Sheet for Healthcare Providers: BankingDealers.co.za  This test is not yet approved or  cleared by the [place] FDA and has been authorized for detection and/or diagnosis of SARS-CoV-2 by FDA under an Emergency Use Authorization (EUA).  This EUA will remain in effect (meaning this test can be used) for the duration of the COVID-19 declaration under Section 564(b)(1) of the Act, 21 U.S.C. section 360bbb-3(b)(1), unless the authorization is terminated or revoked sooner.  Performed at [facility], [place] [address], [place], [place] [address]      Discharge Instructions:   Discharge Instructions    Call MD for:  persistant nausea and vomiting   Complete by: As directed    Call MD for:  temperature >100.4   Complete by: As directed    Diet - low sodium heart healthy   Complete by: As directed    Discharge instructions   Complete by: As directed    Follow-up with your primary care provider at skilled nursing facility in 3 to 5 days.  Check blood work at that time.  Medications have been changed.  Follow-up with your kidney doctor at [facility]   Increase activity slowly   Complete by: As directed      Allergies as of 02/07/2020      Reactions   Esomeprazole Magnesium Other (See Comments)   Suspected interstitial nephritis 2018   Eggs Or Egg-derived Products Rash      Medication List    STOP taking these medications   Entresto 97-103 MG Generic drug: sacubitril-valsartan   furosemide 40 MG tablet Commonly known as: LASIX     TAKE these medications   allopurinol 100 MG tablet Commonly known as:  ZYLOPRIM Take 100 mg by mouth daily as needed.   amiodarone 200 MG tablet Commonly known as: PACERONE Take 1 tablet (200 mg total) by mouth daily.   amLODipine 10 MG tablet Commonly known as: NORVASC Take 1 tablet (10 mg total) by mouth daily.   apixaban 5 MG Tabs tablet Commonly known as: ELIQUIS Take 1 tablet (5 mg total) by mouth 2 (two) times daily.   atorvastatin 80 MG tablet Commonly known as: LIPITOR Take 1 tablet (80 mg total) by mouth daily.   Biofreeze Roll-On Colorless 4 % Gel Generic drug: Menthol (Topical Analgesic) Apply 1 application topically daily.   carvedilol 6.25 MG tablet Commonly known as: COREG Take 1 tablet (6.25 mg total) by mouth 2 (two) times daily with a meal. What changed:   medication strength  how much to take   diclofenac Sodium 1 % Gel Commonly known as: VOLTAREN Apply 2 g topically 2 (two) times daily. To left elbow, left forearm.   famotidine 20 MG tablet Commonly known as: PEPCID Take 20 mg by mouth daily.   feeding supplement (NEPRO CARB STEADY) Liqd Take 237 mLs by mouth 3 (three) times daily between meals.   gabapentin 100 MG capsule Commonly known as: NEURONTIN Take 1 capsule (100 mg total) by mouth 3 (three) times daily.   hydrALAZINE 50 MG tablet Commonly known as: APRESOLINE Take 1 tablet (50 mg total) by mouth 3 (three) times daily.  levothyroxine 50 MCG tablet Commonly known as: SYNTHROID Take 1 tablet (50 mcg total) by mouth daily before breakfast. What changed:   medication strength  how much to take   multivitamin with minerals Tabs tablet Take 1 tablet by mouth daily.   predniSONE 10 MG tablet Commonly known as: DELTASONE Take 3 tablets (30 mg total) by mouth daily with breakfast for 2 days, THEN 2 tablets (20 mg total) daily with breakfast for 2 days, THEN 1 tablet (10 mg total) daily with breakfast for 2 days. Start taking on: February 08, 2020       Follow-up Information    [name],  MD Follow up on 04/05/2020.   Specialty: Cardiology Why: Please arrive 15 minutes early for your 11:40am post-hospital cardiology appointment Contact information: [place] [address] [phone]                Time coordinating discharge: 39 minutes  Signed:  Damyan Corne  [facility] 02/07/2020, 11:42 AM

## 2020-02-07 NOTE — Plan of Care (Signed)
  Problem: Nutrition: Goal: Adequate nutrition will be maintained Outcome: Progressing   Problem: Activity: Goal: Risk for activity intolerance will decrease Outcome: Progressing   Problem: Health Behavior/Discharge Planning: Goal: Ability to manage health-related needs will improve Outcome: Progressing   Problem: Nutrition: Goal: Adequate nutrition will be maintained Outcome: Progressing

## 2020-02-07 NOTE — TOC Progression Note (Signed)
Transition of Care [facility]) - Progression Note    Patient Details  Name: Hector [name] MRN: [id number] Date of Birth: [birth year]  Transition of Care [facility]) CM/SW Contact  Abass Misener, Juliann Pulse, RN Phone Number: 02/07/2020, 11:38 AM  Clinical Narrative: [facility] has a bed available, has auth-will need d/c summary before 4p if d/c today. Covid ordered.      Expected Discharge Plan: [place] Barriers to Discharge: Continued Medical Work up  Expected Discharge Plan and Services Expected Discharge Plan: [place]   Discharge Planning Services: CM Consult Post Acute Care Choice: [place] Living arrangements for the past 2 months: [place] Expected Discharge Date:  (unknown)                                     Social Determinants of Health (SDOH) Interventions    Readmission Risk Interventions Readmission Risk Prevention Plan 02/01/2020 01/02/2020  Transportation Screening Complete Complete  PCP or Specialist Appt within 5-7 Days Complete -  PCP or Specialist Appt within 3-5 Days - Complete  Home Care Screening Complete -  Medication Review (RN CM) Complete -  HRI or [place] - Complete  Social Work Consult for [place] Planning/Counseling - Complete  Palliative Care Screening - Not Applicable  Medication Review Press photographer) - Complete  Some recent data might be hidden

## 2020-02-07 NOTE — Plan of Care (Signed)
  Problem: Clinical Measurements: Goal: Ability to maintain clinical measurements within normal limits will improve Outcome: Progressing Goal: Diagnostic test results will improve Outcome: Progressing   Problem: Activity: Goal: Risk for activity intolerance will decrease Outcome: Progressing   Problem: Clinical Measurements: Goal: Respiratory complications will improve Outcome: Completed/Met Goal: Cardiovascular complication will be avoided Outcome: Completed/Met

## 2020-02-08 LAB — GLUCOSE, CAPILLARY: Glucose-Capillary: 128 mg/dL — ABNORMAL HIGH (ref 70–99)

## 2020-02-08 NOTE — Progress Notes (Signed)
The Private Transport [place] arrived. The patient was transported by wheelchair(his own) and 2 oxygen tanks. The Nasal Canula was placed on patient and set at 1 l/Min for transportation.  The patient also had the white envelope with discharge and AVS papers.  The patient also had 3 pairs of sweat pants and new underwear in a bag.  Report was called to [place] and [phone] clear [address],[place]. To [place] in the Covid unit.

## 2020-04-04 NOTE — Progress Notes (Signed)
Date:  04/05/2020   ID:  Hector [name], DOB November 05, 1965, MRN [id number]  Patient Location:  [place] [id number]   Provider location:   [facility], [place] office  PCP:  [name], MD  Cardiologist:  [name]   Chief Complaint  Patient presents with   Follow-up    12 month; Hospital f/u--AFIB     History of Present Illness:    Hector [name] is a 55 y.o. male  past medical history of alcohol abuse nonischemic cardiomyopathy, possibly alcohol-related, cardiac cath 2016 nonobstructive CAD,  chronic combined heart failure,  hypertension,  obesity,  Paroxysmal atrial fibrillation.  Flutter Possible sleep apnea Ejection fraction in the 20s% dating back to 2009, confirmed by echo 2012, ejection fraction 45 to 50% in October 2018  Recent events reviewed In the hospital November 2021 Covid 19 pneumonia, respiratory failure, ARDS Given remdesivir, steroids,baricitinib Nephrology follow-up for renal failure Delene Loll was held secondary to renal failure Is on Lasix as needed  Reports that he lost 8 pounds, became very weak  Back in the hospital 1 month later December 2021 Acute renal failure superimposed on stage 3b chronic kidney disease ([place]) UTI, acute on chronic diastolic CHF, and poorly controlled hypertension.Initially hyperkalemia with elevated BUN and creatinine levels Creatinine of 2.9 on admission given [place]  required IV Lasix Gout, seen by orthopedics, aspiration of joint attempted  Recent echocardiogram December 2021 ejection fraction 45 to 50%  Recent lab work reviewed Potassium  4,.1 2.31  CR BUN 32 HGB 9.7, down from 11.8  EKG Feb 05 2020: NSR  Reports that he is followed by [facility] Discussed recent weight loss with him, reports most of the weight loss was when he had COVID, less of an appetite  On his visit today hypotensive both arms 90 systolic Verified  by nursing and by myself bilaterally Denies significant orthostasis symptoms  Medication list from rehab not available, appears he is on hydralazine, amlodipine, carvedilol We did call the rehab and had them fax the medications over, fax was poor copy, unable to read names of medications  EKG personally reviewed by myself on todays visit Show normal sinus rhythm rate 68 bpm consider old anterior MI/interventricular conduction delay  Past medical history reviewed In 2016 he was diagnosed with atrial flutter and was cardioverted in June 2016.  reverted to atrial fibrillation and was placed on amiodarone and Eliquis   Seen by electrophysiology in 2016 in the setting of LV dysfunction and  catheterization at that time showed nonobstructive CAD.     echo in April 2018 showed recurrent LV dysfunction with an EF of 30-35% diffuse hypokinesis in the setting of some noncompliance with medications.    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 12/2007 MV: EF 28%, no isch;  b. 8/12 Echo: EF 25-35%; c. 02/2014 Echo: EF 20-25%; d. 12/2014 Cath: minimal CAD; e. 01/2015 Echo: EF 50-55%;  d. 05/2016 Echo: EF 30-35%, diff HK, gr2 DD; e. 11/2016 Echo: EF 45-50%, diff HK.   Chronic combined systolic (congestive) and diastolic (congestive) heart failure ([place])    a. 05/2016 Most recent Echo: EF 30-35%, diff HK, Gr2 DD, mild MR, mod dil LA; b. 11/2016 Echo: EF 45-50%, diff HK.   CKD (chronic kidney disease), stage III (HCC)    Elevated troponin  Chronically elevated. - level was 0.17-0.10 during recent admission   Essential hypertension    GI bleed 11/2013   Hyperlipidemia    Pancreatitis    Paroxysmal A-fib (HCC)    a. new onset s/p unsuccessful TEE/DCCV on 08/16/2014; b. on amio/eliquis (CHA2DS2VASc = 2-3); c. reports 1-2 hrs of afib ~ q67mo.   Paroxysmal atrial  flutter ([place]    a. new onset 07/2014; b. s/p unsuccessful TEE/DCCV 08/16/2014; c. on apixaban.   Sleep-disordered breathing    Has yet to have a sleep study   Past Surgical History:  Procedure Laterality Date   CARDIAC CATHETERIZATION N/A 01/09/2015   Procedure: Left Heart Cath and Coronary Angiography;  Surgeon: DLeonie Man MD;  [facility];  Service: Cardiovascular;  Laterality: N/A;   ELECTROPHYSIOLOGIC STUDY N/A 08/16/2014   Procedure: CARDIOVERSION;  Surgeon: TMinna Merritts MD;  Location: [facility] ORS;  Service: Cardiovascular;  Laterality: N/A;   FLEXIBLE SIGMOIDOSCOPY N/A 10/10/2015   Procedure: FLEXIBLE SIGMOIDOSCOPY;  Surgeon: MLollie Sails MD;  [facility];  Service: Endoscopy;  Laterality: N/A;   KNEE SURGERY Right    NM MYOVIEW LTD  November 2011   No ischemia or infarction. EF 50-55% ( no improvement from 2009 Myoview EF of 28%   TEE WITHOUT CARDIOVERSION N/A 08/16/2014   Procedure: TRANSESOPHAGEAL ECHOCARDIOGRAM (TEE);  Surgeon: TMinna Merritts MD;  Location: [facility] ORS;  Service: Cardiovascular;  Laterality: N/A;     Current Meds  Medication Sig   allopurinol (ZYLOPRIM) 100 MG tablet Take 100 mg by mouth daily as needed.   amiodarone (PACERONE) 200 MG tablet Take 1 tablet (200 mg total) by mouth daily.   apixaban (ELIQUIS) 5 MG TABS tablet Take 1 tablet (5 mg total) by mouth 2 (two) times daily.   atorvastatin (LIPITOR) 80 MG tablet Take 1 tablet (80 mg total) by mouth daily.   carvedilol (COREG) 6.25 MG tablet Take 1 tablet (6.25 mg total) by mouth 2 (two) times daily with a meal.   docusate sodium (COLACE) 100 MG capsule Take 100 mg by mouth daily.   famotidine (PEPCID) 20 MG tablet Take 20 mg by mouth daily.   ferrous gluconate (FERGON) 324 MG tablet Take 324 mg by mouth daily with breakfast.   gabapentin (NEURONTIN) 100 MG capsule Take 1 capsule (100 mg total) by mouth 3 (three) times daily.   levothyroxine  (SYNTHROID) 25 MCG tablet Take 25 mcg by mouth daily before breakfast.   Menthol, Topical Analgesic, (BIOFREEZE ROLL-ON COLORLESS) 4 % GEL Apply 1 application topically daily.   Nutritional Supplements (FEEDING SUPPLEMENT, NEPRO CARB STEADY,) LIQD Take 237 mLs by mouth 3 (three) times daily between meals.   ondansetron (ZOFRAN) 4 MG tablet Take 4 mg by mouth every 8 (eight) hours as needed for nausea or vomiting.   Pollen Extracts (PROSTAT PO) Take 50 mLs by mouth daily.   vitamin B-12 (CYANOCOBALAMIN) 500 MCG tablet Take 500 mcg by mouth daily.   [DISCONTINUED] amLODipine (NORVASC) 10 MG tablet Take 1 tablet (10 mg total) by mouth daily.   [DISCONTINUED] hydrALAZINE (APRESOLINE) 50 MG tablet Take 1 tablet (50 mg total) by mouth 3 (three) times daily.     Allergies:   Esomeprazole magnesium and Eggs or egg-derived products   Social History   Tobacco Use   Smoking status: Former Smoker    Packs/day: 1.00    Years: 12.00    Pack years: 12.00    Quit date: 02/23/1998    Years  since quitting: 22.1   Smokeless tobacco: Never Used  Vaping Use   Vaping Use: Never used  Substance Use Topics   Alcohol use: No    Alcohol/week: 0.0 standard drinks    Comment: 4 heavy alcohol drinker. Quit 2 years ago.   Drug use: No     Current Outpatient Medications on File Prior to Visit  Medication Sig Dispense Refill   allopurinol (ZYLOPRIM) 100 MG tablet Take 100 mg by mouth daily as needed.     amiodarone (PACERONE) 200 MG tablet Take 1 tablet (200 mg total) by mouth daily. 90 tablet 3   apixaban (ELIQUIS) 5 MG TABS tablet Take 1 tablet (5 mg total) by mouth 2 (two) times daily. 60 tablet 5   atorvastatin (LIPITOR) 80 MG tablet Take 1 tablet (80 mg total) by mouth daily. 30 tablet 5   carvedilol (COREG) 6.25 MG tablet Take 1 tablet (6.25 mg total) by mouth 2 (two) times daily with a meal.     docusate sodium (COLACE) 100 MG capsule Take 100 mg by mouth daily.     famotidine (PEPCID)  20 MG tablet Take 20 mg by mouth daily.     ferrous gluconate (FERGON) 324 MG tablet Take 324 mg by mouth daily with breakfast.     gabapentin (NEURONTIN) 100 MG capsule Take 1 capsule (100 mg total) by mouth 3 (three) times daily.     levothyroxine (SYNTHROID) 25 MCG tablet Take 25 mcg by mouth daily before breakfast.     Menthol, Topical Analgesic, (BIOFREEZE ROLL-ON COLORLESS) 4 % GEL Apply 1 application topically daily.     Nutritional Supplements (FEEDING SUPPLEMENT, NEPRO CARB STEADY,) LIQD Take 237 mLs by mouth 3 (three) times daily between meals.  0   ondansetron (ZOFRAN) 4 MG tablet Take 4 mg by mouth every 8 (eight) hours as needed for nausea or vomiting.     Pollen Extracts (PROSTAT PO) Take 50 mLs by mouth daily.     vitamin B-12 (CYANOCOBALAMIN) 500 MCG tablet Take 500 mcg by mouth daily.     No current facility-administered medications on file prior to visit.     Family Hx: The patient's family history includes Diabetes in his mother; Hyperlipidemia in his mother; Hypertension in his mother.  ROS:   Please see the history of present illness.    Review of Systems  Constitutional: Negative.   Respiratory: Negative.   Cardiovascular: Negative.   Gastrointestinal: Negative.   Musculoskeletal: Negative.   Neurological: Negative.   Psychiatric/Behavioral: Negative.   All other systems reviewed and are negative.     Labs/Other Tests and Data Reviewed:    Recent Labs: 01/11/2020: Magnesium 2.0 01/30/2020: B Natriuretic Peptide 74.1 01/31/2020: ALT 41 02/01/2020: TSH <0.010 02/07/2020: BUN 98; Creatinine, Ser 2.85; Hemoglobin 11.8; Platelets 282; Potassium 4.2; Sodium 132   Recent Lipid Panel Lab Results  Component Value Date/Time   CHOL 89 02/03/2020 06:02 AM   CHOL 249 (H) 10/11/2018 12:26 PM   CHOL 118 03/08/2014 04:10 AM   TRIG 125 02/03/2020 06:02 AM   TRIG 95 03/08/2014 04:10 AM   HDL 15 (L) 02/03/2020 06:02 AM   HDL 36 (L) 10/11/2018 12:26 PM   HDL 28  (L) 03/08/2014 04:10 AM   CHOLHDL 5.9 02/03/2020 06:02 AM   LDLCALC 49 02/03/2020 06:02 AM   LDLCALC 174 (H) 10/11/2018 12:26 PM   LDLCALC 71 03/08/2014 04:10 AM    Wt Readings from Last 3 Encounters:  02/08/20 (P) 233 lb 7.5 oz (105.9  kg)  01/23/20 274 lb (124.3 kg)  01/12/20 259 lb 6.4 oz (117.7 kg)     Exam:    BP (!) 96/58    Pulse 68    Ht '5\' 11"'$  (1.803 m)    BMI (P) 32.56 kg/m  Blood pressure confirmed bilaterally by myself He reports weight 200 pounds, unable to stand today Constitutional:  oriented to person, place, and time. No distress.  In wheelchair HENT:  Head: Grossly normal Eyes:  no discharge. No scleral icterus.  Neck: No JVD, no carotid bruits  Cardiovascular: Regular rate and rhythm, no murmurs appreciated Pulmonary/Chest: Clear to auscultation bilaterally, no wheezes or rails Abdominal: Soft.  no distension.  no tenderness.  Musculoskeletal: Very weak legs, needing assistance to move his legs  onto the wheelchair leg support Neurological:  normal muscle tone. Coordination normal. No atrophy Skin: Skin warm and dry Psychiatric: normal affect, pleasant  ASSESSMENT & PLAN:    Chronic systolic heart failure (HCC) Euvolemic,  Recent echocardiogram reviewed, mildly depressed ejection fraction  Paroxysmal atrial flutter (HCC) Maintaining normal sinus rhythm, continue carvedilol and amiodarone  Paroxysmal atrial fibrillation (HCC) As above no medication changes made  Hypotension Confirmed by myself, 90 systolic bilaterally, Likely secondary to traumatic weight loss over the past several months to 80 down to 200 Recommended he hold hydralazine, hold amlodipine Monitor blood pressure at the facility  Nonischemic cardiomyopathy ([place]) Mildly depressed ejection fraction Stable from prior studies  Obstructive sleep apnea Recommend CPAP on a regular basis May improve with 80 pound weight loss  Mixed hyperlipidemia Repeat lipid panel at goal   Total  encounter time more than 35 minutes  Greater than 50% was spent in counseling and coordination of care with the patient   Signed, Ida Rogue, MD  04/05/2020 6:56 PM    [place] [id number], [place], [place] [address]

## 2020-04-05 ENCOUNTER — Encounter: Payer: Self-pay | Admitting: Cardiovascular Disease

## 2020-04-05 ENCOUNTER — Other Ambulatory Visit: Payer: Self-pay

## 2020-04-05 ENCOUNTER — Ambulatory Visit (INDEPENDENT_AMBULATORY_CARE_PROVIDER_SITE_OTHER): Payer: Medicaid Other | Admitting: Cardiovascular Disease

## 2020-04-05 VITALS — BP 96/58 | HR 68 | Ht 71.0 in

## 2020-04-05 DIAGNOSIS — I5032 Chronic diastolic (congestive) heart failure: Secondary | ICD-10-CM | POA: Diagnosis not present

## 2020-04-05 DIAGNOSIS — E782 Mixed hyperlipidemia: Secondary | ICD-10-CM | POA: Diagnosis not present

## 2020-04-05 DIAGNOSIS — I10 Essential (primary) hypertension: Secondary | ICD-10-CM | POA: Diagnosis not present

## 2020-04-05 DIAGNOSIS — G4733 Obstructive sleep apnea (adult) (pediatric): Secondary | ICD-10-CM | POA: Diagnosis not present

## 2020-04-05 DIAGNOSIS — I428 Other cardiomyopathies: Secondary | ICD-10-CM

## 2020-04-05 DIAGNOSIS — I480 Paroxysmal atrial fibrillation: Secondary | ICD-10-CM | POA: Diagnosis not present

## 2020-04-05 NOTE — Patient Instructions (Addendum)
Blood pressure very low in both arms today, 99991111 systolic   Medication Instructions:  Please stop the hydralazine Stop the amlodipine   If you need a refill on your cardiac medications before your next appointment, please call your pharmacy.    Lab work: No new labs needed   If you have labs (blood work) drawn today and your tests are completely normal, you will receive your results only by: Marland Kitchen MyChart Message (if you have MyChart) OR . A paper copy in the mail If you have any lab test that is abnormal or we need to change your treatment, we will call you to review the results.   Testing/Procedures: No new testing needed   Follow-Up: At [facility], you and your health needs are our priority.  As part of our continuing mission to provide you with exceptional heart care, we have created designated Provider Care Teams.  These Care Teams include your primary Cardiologist (physician) and Advanced Practice Providers (APPs -  Physician Assistants and Nurse Practitioners) who all work together to provide you with the care you need, when you need it.  . You will need a follow up appointment in 3 months  . Providers on your designated Care Team:   . Murray Hodgkins, NP . Christell Faith, PA-C . Marrianne Mood, PA-C  Any Other Special Instructions Will Be Listed Below (If Applicable).  COVID-19 Vaccine Information can be found at: ShippingScam.co.uk For questions related to vaccine distribution or appointments, please email [email or web address] or call [phone].    Marland Kitchen

## 2020-04-25 ENCOUNTER — Other Ambulatory Visit: Payer: Self-pay | Admitting: Family Medicine

## 2020-04-25 DIAGNOSIS — M479 Spondylosis, unspecified: Secondary | ICD-10-CM

## 2020-04-25 DIAGNOSIS — M6250 Muscle wasting and atrophy, not elsewhere classified, unspecified site: Secondary | ICD-10-CM

## 2020-05-09 ENCOUNTER — Observation Stay: Admission: RE | Admit: 2020-05-09 | Payer: Medicaid Other | Source: Ambulatory Visit

## 2020-06-16 ENCOUNTER — Emergency Department: Payer: Medicaid Other

## 2020-06-16 ENCOUNTER — Other Ambulatory Visit: Payer: Self-pay

## 2020-06-16 ENCOUNTER — Encounter: Payer: Self-pay | Admitting: Emergency Medicine

## 2020-06-16 ENCOUNTER — Emergency Department
Admission: EM | Admit: 2020-06-16 | Discharge: 2020-06-16 | Disposition: A | Discharge status: Home / Self Care | Payer: Medicaid Other | Attending: Emergency Medicine | Admitting: Emergency Medicine

## 2020-06-16 DIAGNOSIS — Z8616 Personal history of COVID-19: Secondary | ICD-10-CM | POA: Insufficient documentation

## 2020-06-16 DIAGNOSIS — I509 Heart failure, unspecified: Secondary | ICD-10-CM

## 2020-06-16 DIAGNOSIS — Z7901 Long term (current) use of anticoagulants: Secondary | ICD-10-CM | POA: Diagnosis not present

## 2020-06-16 DIAGNOSIS — I130 Hypertensive heart and chronic kidney disease with heart failure and stage 1 through stage 4 chronic kidney disease, or unspecified chronic kidney disease: Secondary | ICD-10-CM | POA: Insufficient documentation

## 2020-06-16 DIAGNOSIS — I5042 Chronic combined systolic (congestive) and diastolic (congestive) heart failure: Secondary | ICD-10-CM | POA: Diagnosis not present

## 2020-06-16 DIAGNOSIS — Z79899 Other long term (current) drug therapy: Secondary | ICD-10-CM | POA: Diagnosis not present

## 2020-06-16 DIAGNOSIS — I480 Paroxysmal atrial fibrillation: Secondary | ICD-10-CM | POA: Diagnosis not present

## 2020-06-16 DIAGNOSIS — N1832 Chronic kidney disease, stage 3b: Secondary | ICD-10-CM | POA: Diagnosis not present

## 2020-06-16 DIAGNOSIS — R600 Localized edema: Secondary | ICD-10-CM

## 2020-06-16 DIAGNOSIS — Z87891 Personal history of nicotine dependence: Secondary | ICD-10-CM | POA: Diagnosis not present

## 2020-06-16 DIAGNOSIS — R0602 Shortness of breath: Secondary | ICD-10-CM | POA: Diagnosis present

## 2020-06-16 LAB — CBC WITH DIFFERENTIAL/PLATELET
Abs Immature Granulocytes: 0.05 10*3/uL (ref 0.00–0.07)
Basophils Absolute: 0.1 10*3/uL (ref 0.0–0.1)
Basophils Relative: 1 %
Eosinophils Absolute: 0.1 10*3/uL (ref 0.0–0.5)
Eosinophils Relative: 1 %
HCT: 36.9 % — ABNORMAL LOW (ref 39.0–52.0)
Hemoglobin: 11.7 g/dL — ABNORMAL LOW (ref 13.0–17.0)
Immature Granulocytes: 1 %
Lymphocytes Relative: 26 %
Lymphs Abs: 2.2 10*3/uL (ref 0.7–4.0)
MCH: 29.3 pg (ref 26.0–34.0)
MCHC: 31.7 g/dL (ref 30.0–36.0)
MCV: 92.3 fL (ref 80.0–100.0)
Monocytes Absolute: 0.5 10*3/uL (ref 0.1–1.0)
Monocytes Relative: 5 %
Neutro Abs: 5.8 10*3/uL (ref 1.7–7.7)
Neutrophils Relative %: 66 %
Platelets: 381 10*3/uL (ref 150–400)
RBC: 4 MIL/uL — ABNORMAL LOW (ref 4.22–5.81)
RDW: 17.1 % — ABNORMAL HIGH (ref 11.5–15.5)
WBC: 8.7 10*3/uL (ref 4.0–10.5)
nRBC: 0.3 % — ABNORMAL HIGH (ref 0.0–0.2)

## 2020-06-16 LAB — BASIC METABOLIC PANEL
Anion gap: 14 (ref 5–15)
BUN: 25 mg/dL — ABNORMAL HIGH (ref 6–20)
CO2: 14 mmol/L — ABNORMAL LOW (ref 22–32)
Calcium: 8.5 mg/dL — ABNORMAL LOW (ref 8.9–10.3)
Chloride: 108 mmol/L (ref 98–111)
Creatinine, Ser: 2.59 mg/dL — ABNORMAL HIGH (ref 0.61–1.24)
GFR, Estimated: 28 mL/min — ABNORMAL LOW (ref >60)
Glucose, Bld: 91 mg/dL (ref 70–99)
Potassium: 5.3 mmol/L — ABNORMAL HIGH (ref 3.5–5.1)
Sodium: 136 mmol/L (ref 135–145)

## 2020-06-16 LAB — BRAIN NATRIURETIC PEPTIDE: B Natriuretic Peptide: 2784.2 pg/mL — ABNORMAL HIGH (ref 0.0–100.0)

## 2020-06-16 LAB — TROPONIN I (HIGH SENSITIVITY)
Troponin I (High Sensitivity): 48 ng/L — ABNORMAL HIGH (ref <18)
Troponin I (High Sensitivity): 69 ng/L — ABNORMAL HIGH (ref <18)

## 2020-06-16 IMAGING — DX DG CHEST 1V PORT
1 series · 1 of 1 positions shown · non-contrast
Comparison: [DATE]

CLINICAL DATA: Patient KARIMDAD 6 3 days ago. Bilateral lower
leg swelling.

EXAM:
PORTABLE CHEST 1 VIEW

[chest ap]
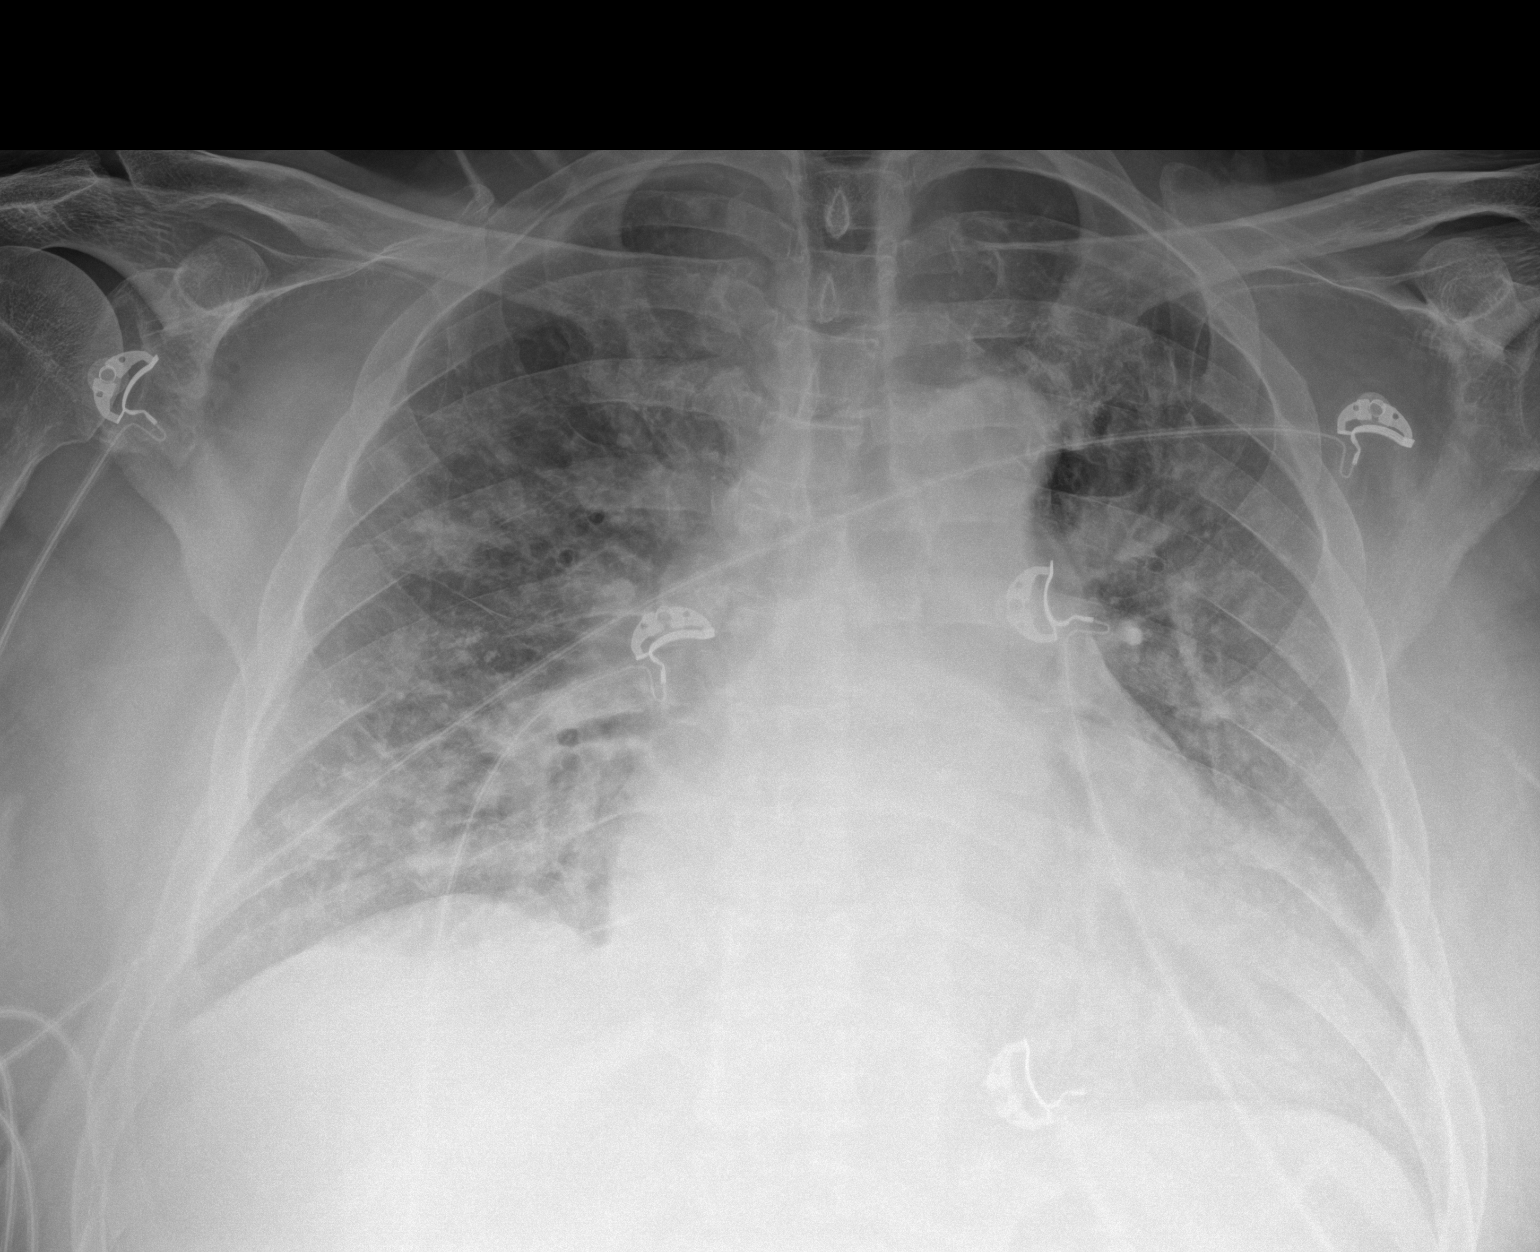

[1 of 1 positions shown; findings below may reference images not displayed]

FINDINGS: No pneumothorax. Bilateral pulmonary opacities have a interstitial
component. There is a mildly more focal opacity in the right mid
lung. Cardiomegaly. The hila and mediastinum are unremarkable. No
pneumothorax.
IMPRESSION: 1. Cardiomegaly.
2. Bilateral pulmonary opacities are largely interstitial with a
more focal component the right mid lung. Asymmetric edema and a
diffuse multifocal infectious process are both possible. Recommend
clinical correlation and short-term follow-up imaging to ensure
resolution.

## 2020-06-16 MED ORDER — FUROSEMIDE 40 MG PO TABS: 40.0000 mg | ORAL_TABLET | Freq: Every day | ORAL | 0 refills | Status: DC

## 2020-06-16 MED ORDER — FUROSEMIDE 10 MG/ML IJ SOLN
40.0000 mg | Freq: Once | INTRAMUSCULAR | Status: AC
  Administered 2020-06-16: 40 mg via INTRAVENOUS
  Filled 2020-06-16: qty 4

## 2020-06-16 NOTE — Discharge Instructions (Signed)
Your labs today were okay.  Your symptoms appear to be due to some mild worsening of your heart failure due to being out of Lasix.  Please restart taking it with the prescription we gave you today and follow-up with the heart failure clinic this week.

## 2020-06-16 NOTE — ED Notes (Signed)
Repeat trop collected at this time, pt's IV reinforced with extra tape. Pt sitting up on side of bed at this time.

## 2020-06-16 NOTE — ED Provider Notes (Signed)
[facility] Emergency Department Provider Note  ____________________________________________  Time seen: Approximately 3:15 PM  I have reviewed the triage vital signs and the nursing notes.   HISTORY  Chief Complaint Leg Swelling    HPI Hector [name] is a 55 y.o. male with a history of heart failure, hypertension, CKD, A. fib who comes to the ED complaining of gradually worsening shortness of breath over the last 3 days due to being out of Lasix.  He also has increased leg swelling.  Shortness of breath is worse with lying down, better sitting upright.  No chest pain.  No significant exertional symptoms.  No dizziness or palpitations.  Symptoms are constant.  No alleviating factors      Past Medical History:  Diagnosis Date  . Alcohol abuse   . 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 12/2007 MV: EF 28%, no isch;  b. 8/12 Echo: EF 25-35%; c. 02/2014 Echo: EF 20-25%; d. 12/2014 Cath: minimal CAD; e. 01/2015 Echo: EF 50-55%;  d. 05/2016 Echo: EF 30-35%, diff HK, gr2 DD; e. 11/2016 Echo: EF 45-50%, diff HK.  . Chronic combined systolic (congestive) and diastolic (congestive) heart failure ([place])    a. 05/2016 Most recent Echo: EF 30-35%, diff HK, Gr2 DD, mild MR, mod dil LA; b. 11/2016 Echo: EF 45-50%, diff HK.  . CKD (chronic kidney disease), stage III ([place])   . Elevated troponin    Chronically elevated. - level was 0.17-0.10 during recent admission  . Essential hypertension   . GI bleed 11/2013  . Hyperlipidemia   . Pancreatitis   . Paroxysmal A-fib ([place])    a. new onset s/p unsuccessful TEE/DCCV on 08/16/2014; b. on amio/eliquis (CHA2DS2VASc = 2-3); c. reports 1-2 hrs of afib ~ q27mo.  . Paroxysmal atrial flutter ([place]    a. new onset 07/2014; b. s/p unsuccessful TEE/DCCV 08/16/2014; c. on apixaban.  . Sleep-disordered breathing    Has yet to have a sleep study     Patient Active Problem List   Diagnosis Date Noted  . Elbow effusion 02/04/2020  .  Left elbow pain 02/03/2020  . Hyperthyroidism without thyroid nodule 02/03/2020  . UTI (urinary tract infection) 01/30/2020  . Acute respiratory distress syndrome (ARDS) due to COVID-19 virus ([place] 12/31/2019  . Acute renal failure superimposed on stage 3b chronic kidney disease ([place] 12/31/2019  . Hyperkalemia 12/31/2019  . Hyponatremia 12/31/2019  . Morbidly obese ([place] 12/29/2019  . Pneumonia due to COVID-19 virus 12/22/2019    Class: Acute  . Acute respiratory failure with hypoxemia ([place] 12/22/2019    Class: Acute  . Severe sepsis ([place] 12/22/2019    Class: Acute  . COVID-19 12/22/2019  . Acute on chronic systolic CHF (congestive heart failure) ([place] 07/21/2016  . Obstructive sleep apnea 04/21/2016  . Nonischemic cardiomyopathy ([place] 05/26/2015  . Chronic diastolic heart failure ([place] 04/01/2015  . Essential hypertension 04/01/2015  . Sinus bradycardia 01/29/2015  . Paroxysmal atrial fibrillation ([place] 08/16/2014  . Paroxysmal atrial flutter ([place] 08/14/2014    Class: Acute  . Chronic kidney disease (CKD), stage III (moderate) ([place] 07/02/2014  . Hypertensive heart disease   . Hyperlipidemia      Past Surgical History:  Procedure Laterality Date  . CARDIAC CATHETERIZATION N/A 01/09/2015   Procedure: Left Heart Cath and Coronary Angiography;  Surgeon: DLeonie Man MD;  [facility];  Service: Cardiovascular;  Laterality: N/A;  . ELECTROPHYSIOLOGIC STUDY N/A 08/16/2014   Procedure: CARDIOVERSION;  Surgeon: TKathlene November  Rockey Situ, MD;  Location: [facility] ORS;  Service: Cardiovascular;  Laterality: N/A;  . FLEXIBLE SIGMOIDOSCOPY N/A 10/10/2015   Procedure: FLEXIBLE SIGMOIDOSCOPY;  Surgeon: [name], MD;  Location: [facility] ENDOSCOPY;  Service: Endoscopy;  Laterality: N/A;  . KNEE SURGERY Right   . NM MYOVIEW LTD  November 2011   No ischemia or infarction. EF 50-55% ( no improvement from 2009 Myoview EF of 28%  . TEE WITHOUT CARDIOVERSION N/A 08/16/2014    Procedure: TRANSESOPHAGEAL ECHOCARDIOGRAM (TEE);  Surgeon: [name], MD;  Location: [facility] ORS;  Service: Cardiovascular;  Laterality: N/A;     Prior to Admission medications   Medication Sig Start Date End Date Taking? Authorizing Provider  furosemide (LASIX) 40 MG tablet Take 1 tablet (40 mg total) by mouth daily for 15 days. 06/16/20 07/01/20 Yes Carrie Mew, MD  allopurinol (ZYLOPRIM) 100 MG tablet Take 100 mg by mouth daily as needed.    [provider]  amiodarone (PACERONE) 200 MG tablet Take 1 tablet (200 mg total) by mouth daily. 01/03/18   Alisa Graff, FNP  apixaban (ELIQUIS) 5 MG TABS tablet Take 1 tablet (5 mg total) by mouth 2 (two) times daily. 12/01/18   Alisa Graff, FNP  atorvastatin (LIPITOR) 80 MG tablet Take 1 tablet (80 mg total) by mouth daily. 03/15/18   Alisa Graff, FNP  carvedilol (COREG) 6.25 MG tablet Take 1 tablet (6.25 mg total) by mouth 2 (two) times daily with a meal. 02/07/20   Pokhrel, Laxman, MD  docusate sodium (COLACE) 100 MG capsule Take 100 mg by mouth daily.    [provider]  famotidine (PEPCID) 20 MG tablet Take 20 mg by mouth daily.    [provider]  ferrous gluconate (FERGON) 324 MG tablet Take 324 mg by mouth daily with breakfast.    [provider]  gabapentin (NEURONTIN) 100 MG capsule Take 1 capsule (100 mg total) by mouth 3 (three) times daily. 02/07/20   Pokhrel, Corrie Mckusick, MD  levothyroxine (SYNTHROID) 25 MCG tablet Take 25 mcg by mouth daily before breakfast.    [provider]  Menthol, Topical Analgesic, (BIOFREEZE ROLL-ON COLORLESS) 4 % GEL Apply 1 application topically daily.    [provider]  Nutritional Supplements (FEEDING SUPPLEMENT, NEPRO CARB STEADY,) LIQD Take 237 mLs by mouth 3 (three) times daily between meals. 01/19/20   Enzo Bi, MD  ondansetron (ZOFRAN) 4 MG tablet Take 4 mg by mouth every 8 (eight) hours as needed for nausea or vomiting.    [provider]  Pollen Extracts (PROSTAT PO) Take 50 mLs by mouth daily.    [provider]  vitamin B-12 (CYANOCOBALAMIN) 500 MCG tablet Take 500 mcg by mouth daily.    [provider]     Allergies Esomeprazole magnesium and Eggs or egg-derived products   Family History  Problem Relation Age of Onset  . Hypertension Mother   . Hyperlipidemia Mother   . Diabetes Mother     Social History Social History   Tobacco Use  . Smoking status: Former Smoker    Packs/day: 1.00    Years: 12.00    Pack years: 12.00    Quit date: 02/23/1998    Years since quitting: 22.3  . Smokeless tobacco: Never Used  Vaping Use  . Vaping Use: Never used  Substance Use Topics  . Alcohol use: No    Alcohol/week: 0.0 standard drinks    Comment: 4 heavy alcohol drinker. Quit 2 years ago.  Marland Kitchen  Drug use: No    Review of Systems  Constitutional:   No fever or chills.  ENT:   No sore throat. No rhinorrhea. Cardiovascular:   No chest pain or syncope. Respiratory: Positive shortness of breath without cough. Gastrointestinal:   Negative for abdominal pain, vomiting and diarrhea.  Musculoskeletal:   Negative for focal pain or swelling All other systems reviewed and are negative except as documented above in ROS and HPI.  ____________________________________________   PHYSICAL EXAM:  VITAL SIGNS: ED Triage Vitals  Enc Vitals Group     BP 06/16/20 1216 (!) 174/132     Pulse Rate 06/16/20 1216 92     Resp 06/16/20 1216 (!) 26     Temp 06/16/20 1216 97.9 F (36.6 C)     Temp Source 06/16/20 1216 Oral     SpO2 06/16/20 1216 100 %     Weight 06/16/20 1215 260 lb (117.9 kg)     Height 06/16/20 1215 '5\' 11"'$  (1.803 m)     Head Circumference --      Peak Flow --      Pain Score 06/16/20 1215 0     Pain Loc --      Pain Edu? --      Excl. in [place]? --     Vital signs reviewed, nursing assessments reviewed.   Constitutional:   Alert and oriented. Non-toxic appearance. Eyes:    Conjunctivae are normal. EOMI. PERRL. ENT      Head:   Normocephalic and atraumatic.      Nose:   Wearing a mask.      Mouth/Throat:   Wearing a mask.      Neck:   No meningismus. Full ROM.  No JVD Hematological/Lymphatic/Immunilogical:   No cervical lymphadenopathy. Cardiovascular:   RRR. Symmetric bilateral radial and DP pulses.  No murmurs. Cap refill less than 2 seconds. Respiratory:   Normal respiratory effort without tachypnea/retractions. Breath sounds are clear and equal bilaterally. No wheezes/rales/rhonchi. Gastrointestinal:   Soft and nontender. Non distended. There is no CVA tenderness.  No rebound, rigidity, or guarding. Genitourinary:   deferred Musculoskeletal:   Normal range of motion in all extremities. No joint effusions.  No lower extremity tenderness.  2+ pitting edema bilateral lower extremities, symmetric. Neurologic:   Normal speech and language.  Motor grossly intact. No acute focal neurologic deficits are appreciated.  Skin:    Skin is warm, dry and intact. No rash noted.  No petechiae, purpura, or bullae.  ____________________________________________    LABS (pertinent positives/negatives) (all labs ordered are listed, but only abnormal results are displayed) Labs Reviewed  BASIC METABOLIC PANEL - Abnormal; Notable for the following components:      Result Value   Potassium 5.3 (*)    CO2 14 (*)    BUN 25 (*)    Creatinine, Ser 2.59 (*)    Calcium 8.5 (*)    GFR, Estimated 28 (*)    All other components within normal limits  BRAIN NATRIURETIC PEPTIDE - Abnormal; Notable for the following components:   B Natriuretic Peptide 2,784.2 (*)    All other components within normal limits  CBC WITH DIFFERENTIAL/PLATELET - Abnormal; Notable for the following components:   RBC 4.00 (*)    Hemoglobin 11.7 (*)    HCT 36.9 (*)    RDW 17.1 (*)    nRBC 0.3 (*)    All other components within normal limits  TROPONIN I (HIGH SENSITIVITY) - Abnormal; Notable for the  following components:  Troponin I (High Sensitivity) 48 (*)    All other components within normal limits  TROPONIN I (HIGH SENSITIVITY)   ____________________________________________   EKG Interpreted by me Sinus rhythm rate of 94.  Left axis, left bundle branch block.  No acute ischemic changes.   ____________________________________________    RADIOLOGY  DG Chest Portable 1 View  Result Date: 06/16/2020 CLINICAL DATA:  Patient ran [place] 6 3 days ago. Bilateral lower leg swelling. EXAM: PORTABLE CHEST 1 VIEW COMPARISON:  February 04, 2020 FINDINGS: No pneumothorax. Bilateral pulmonary opacities have a interstitial component. There is a mildly more focal opacity in the right mid lung. Cardiomegaly. The hila and mediastinum are unremarkable. No pneumothorax. IMPRESSION: 1. Cardiomegaly. 2. Bilateral pulmonary opacities are largely interstitial with a more focal component the right mid lung. Asymmetric edema and a diffuse multifocal infectious process are both possible. Recommend clinical correlation and short-term follow-up imaging to ensure resolution. Electronically Signed   By: Dorise Bullion III M.D   On: 06/16/2020 13:03    ____________________________________________   PROCEDURES Procedures  ____________________________________________    CLINICAL IMPRESSION / ASSESSMENT AND PLAN / ED COURSE  Medications ordered in the ED: Medications  furosemide (LASIX) injection 40 mg (40 mg Intravenous Given 06/16/20 1239)    Pertinent labs & imaging results that were available during my care of the patient were reviewed by me and considered in my medical decision making (see chart for details).  THEODORE OHORA was evaluated in Emergency Department on 06/16/2020 for the symptoms described in the history of present illness. He was evaluated in the context of the global COVID-19 pandemic, which necessitated consideration that the patient might be at risk for infection with the  SARS-CoV-2 virus that causes COVID-19. Institutional protocols and algorithms that pertain to the evaluation of patients at risk for COVID-19 are in a state of rapid change based on information released by regulatory bodies including the CDC and federal and state organizations. These policies and algorithms were followed during the patient's care in the ED.   Patient presents with shortness of breath consistent with worsening CHF symptoms in the setting of fever steroid not noncompliance.  Patient given a dose of IV Lasix in the ED.  Labs unremarkable.  Chest x-ray shows some mild vascular congestion, but sats are okay, no respiratory distress.  We will refill his furosemide, refer to CHF clinic.   Considering the patient's symptoms, medical history, and physical examination today, I have low suspicion for ACS, PE, TAD, pneumothorax, carditis, mediastinitis, pneumonia, CHF, or sepsis.  Troponins are chronic baseline.      ____________________________________________   FINAL CLINICAL IMPRESSION(S) / ED DIAGNOSES    Final diagnoses:  Leg edema  Chronic congestive heart failure, unspecified heart failure type [facility])     ED Discharge Orders         Ordered    furosemide (LASIX) 40 MG tablet  Daily        06/16/20 1514          Portions of this note were generated with dragon dictation software. Dictation errors may occur despite best attempts at proofreading.   Carrie Mew, MD 06/16/20 [phone]

## 2020-06-16 NOTE — ED Triage Notes (Signed)
Pt to ED via [facility] with c/o running out of his lasix 3 days ago, per EMS pt c/o bilateral lower leg edema. Per EMS pt was seen and D/C in Nov and since then has had L arm pain. Per EMS pt has not had any morning medications, reports pt HTN, ambulatory on scene PTA.   158/125 97HR 98%

## 2020-06-16 NOTE — ED Notes (Signed)
Medications administered per EDP order, pt provided with 2 urinals per his request. Call bell within reach, lights dimmed for patient comfort.

## 2020-06-16 NOTE — ED Notes (Signed)
Critical Result: Troponin 69  Kinner, MD notified.

## 2020-06-17 ENCOUNTER — Telehealth: Payer: Self-pay | Admitting: Family

## 2020-06-17 NOTE — Telephone Encounter (Signed)
Unable to reach patient as phone number is no longer in service in attempt to schedule an appointment per [place] request.   Luetta Nutting, NT

## 2020-06-18 ENCOUNTER — Emergency Department
Admission: EM | Admit: 2020-06-18 | Discharge: 2020-06-18 | Disposition: A | Discharge status: Home / Self Care | Payer: Medicaid Other | Attending: Emergency Medicine | Admitting: Emergency Medicine

## 2020-06-18 ENCOUNTER — Other Ambulatory Visit: Payer: Self-pay

## 2020-06-18 DIAGNOSIS — I5042 Chronic combined systolic (congestive) and diastolic (congestive) heart failure: Secondary | ICD-10-CM | POA: Diagnosis not present

## 2020-06-18 DIAGNOSIS — Z8616 Personal history of COVID-19: Secondary | ICD-10-CM | POA: Diagnosis not present

## 2020-06-18 DIAGNOSIS — Z7901 Long term (current) use of anticoagulants: Secondary | ICD-10-CM | POA: Diagnosis not present

## 2020-06-18 DIAGNOSIS — I130 Hypertensive heart and chronic kidney disease with heart failure and stage 1 through stage 4 chronic kidney disease, or unspecified chronic kidney disease: Secondary | ICD-10-CM | POA: Diagnosis not present

## 2020-06-18 DIAGNOSIS — Z79899 Other long term (current) drug therapy: Secondary | ICD-10-CM | POA: Insufficient documentation

## 2020-06-18 DIAGNOSIS — R600 Localized edema: Secondary | ICD-10-CM | POA: Diagnosis present

## 2020-06-18 DIAGNOSIS — G5622 Lesion of ulnar nerve, left upper limb: Secondary | ICD-10-CM | POA: Diagnosis not present

## 2020-06-18 DIAGNOSIS — Z87891 Personal history of nicotine dependence: Secondary | ICD-10-CM | POA: Insufficient documentation

## 2020-06-18 DIAGNOSIS — N183 Chronic kidney disease, stage 3 unspecified: Secondary | ICD-10-CM | POA: Diagnosis not present

## 2020-06-18 MED ORDER — HYDRALAZINE HCL 25 MG PO TABS: 25.0000 mg | ORAL_TABLET | Freq: Four times a day (QID) | ORAL | 0 refills | Status: DC

## 2020-06-18 MED ORDER — FUROSEMIDE 40 MG PO TABS: 40.0000 mg | ORAL_TABLET | Freq: Two times a day (BID) | ORAL | 0 refills | Status: DC

## 2020-06-18 NOTE — ED Triage Notes (Signed)
Pt comes via EMS from home with c/o gout. Pt states pain to both feet and swelling. Pt also states pain to left hand and elbow.  Pt seen here Sunday for the gout.  Pt states the meds aren't helping.

## 2020-06-18 NOTE — ED Triage Notes (Signed)
First Nurse Note:  Arrives via [facility] for c/o swelling to feet and pain to hand.  Has history of gout, seen through ED Sunday for same.  BP  188/ 40 per EMS.  AAOx3.  Skin warm and dry. NAD

## 2020-06-18 NOTE — ED Notes (Signed)
See triage note  Presents with possible gout flare  Having pain with some swelling to both feet   Also having some pain /numbness to left lower arm

## 2020-06-19 ENCOUNTER — Emergency Department: Payer: Medicaid Other

## 2020-06-19 ENCOUNTER — Emergency Department
Admission: EM | Admit: 2020-06-19 | Discharge: 2020-06-19 | Disposition: A | Discharge status: Home / Self Care | Payer: Medicaid Other | Attending: Emergency Medicine | Admitting: Emergency Medicine

## 2020-06-19 DIAGNOSIS — Z87891 Personal history of nicotine dependence: Secondary | ICD-10-CM | POA: Diagnosis not present

## 2020-06-19 DIAGNOSIS — Z8616 Personal history of COVID-19: Secondary | ICD-10-CM | POA: Insufficient documentation

## 2020-06-19 DIAGNOSIS — Z7901 Long term (current) use of anticoagulants: Secondary | ICD-10-CM | POA: Insufficient documentation

## 2020-06-19 DIAGNOSIS — N183 Chronic kidney disease, stage 3 unspecified: Secondary | ICD-10-CM | POA: Insufficient documentation

## 2020-06-19 DIAGNOSIS — I509 Heart failure, unspecified: Secondary | ICD-10-CM

## 2020-06-19 DIAGNOSIS — I5042 Chronic combined systolic (congestive) and diastolic (congestive) heart failure: Secondary | ICD-10-CM | POA: Diagnosis not present

## 2020-06-19 DIAGNOSIS — R1032 Left lower quadrant pain: Secondary | ICD-10-CM | POA: Insufficient documentation

## 2020-06-19 DIAGNOSIS — Z20822 Contact with and (suspected) exposure to covid-19: Secondary | ICD-10-CM | POA: Diagnosis not present

## 2020-06-19 DIAGNOSIS — Z79899 Other long term (current) drug therapy: Secondary | ICD-10-CM | POA: Diagnosis not present

## 2020-06-19 DIAGNOSIS — R200 Anesthesia of skin: Secondary | ICD-10-CM | POA: Diagnosis not present

## 2020-06-19 DIAGNOSIS — M7989 Other specified soft tissue disorders: Secondary | ICD-10-CM | POA: Diagnosis present

## 2020-06-19 DIAGNOSIS — I130 Hypertensive heart and chronic kidney disease with heart failure and stage 1 through stage 4 chronic kidney disease, or unspecified chronic kidney disease: Secondary | ICD-10-CM | POA: Diagnosis not present

## 2020-06-19 LAB — RESP PANEL BY RT-PCR (FLU A&B, COVID) ARPGX2
Influenza A by PCR: NEGATIVE
Influenza B by PCR: NEGATIVE
SARS Coronavirus 2 by RT PCR: NEGATIVE

## 2020-06-19 LAB — COMPREHENSIVE METABOLIC PANEL
ALT: 9 U/L (ref 0–44)
AST: 18 U/L (ref 15–41)
Albumin: 3.6 g/dL (ref 3.5–5.0)
Alkaline Phosphatase: 75 U/L (ref 38–126)
Anion gap: 13 (ref 5–15)
BUN: 25 mg/dL — ABNORMAL HIGH (ref 6–20)
CO2: 19 mmol/L — ABNORMAL LOW (ref 22–32)
Calcium: 8.7 mg/dL — ABNORMAL LOW (ref 8.9–10.3)
Chloride: 106 mmol/L (ref 98–111)
Creatinine, Ser: 2.56 mg/dL — ABNORMAL HIGH (ref 0.61–1.24)
GFR, Estimated: 29 mL/min — ABNORMAL LOW (ref >60)
Glucose, Bld: 74 mg/dL (ref 70–99)
Potassium: 4.4 mmol/L (ref 3.5–5.1)
Sodium: 138 mmol/L (ref 135–145)
Total Bilirubin: 2 mg/dL — ABNORMAL HIGH (ref 0.3–1.2)
Total Protein: 6.6 g/dL (ref 6.5–8.1)

## 2020-06-19 LAB — URINALYSIS, COMPLETE (UACMP) WITH MICROSCOPIC
Bacteria, UA: NONE SEEN
Bilirubin Urine: NEGATIVE
Glucose, UA: NEGATIVE mg/dL
Ketones, ur: 5 mg/dL — AB
Nitrite: NEGATIVE
Protein, ur: >=300 mg/dL — AB
Specific Gravity, Urine: 1.012 (ref 1.005–1.030)
pH: 5 (ref 5.0–8.0)

## 2020-06-19 LAB — CBC WITH DIFFERENTIAL/PLATELET
Abs Immature Granulocytes: 0.05 10*3/uL (ref 0.00–0.07)
Basophils Absolute: 0.1 10*3/uL (ref 0.0–0.1)
Basophils Relative: 1 %
Eosinophils Absolute: 0.2 10*3/uL (ref 0.0–0.5)
Eosinophils Relative: 2 %
HCT: 38.7 % — ABNORMAL LOW (ref 39.0–52.0)
Hemoglobin: 12.2 g/dL — ABNORMAL LOW (ref 13.0–17.0)
Immature Granulocytes: 1 %
Lymphocytes Relative: 22 %
Lymphs Abs: 2.1 10*3/uL (ref 0.7–4.0)
MCH: 29.2 pg (ref 26.0–34.0)
MCHC: 31.5 g/dL (ref 30.0–36.0)
MCV: 92.6 fL (ref 80.0–100.0)
Monocytes Absolute: 0.6 10*3/uL (ref 0.1–1.0)
Monocytes Relative: 6 %
Neutro Abs: 6.5 10*3/uL (ref 1.7–7.7)
Neutrophils Relative %: 68 %
Platelets: 334 10*3/uL (ref 150–400)
RBC: 4.18 MIL/uL — ABNORMAL LOW (ref 4.22–5.81)
RDW: 18 % — ABNORMAL HIGH (ref 11.5–15.5)
WBC: 9.5 10*3/uL (ref 4.0–10.5)
nRBC: 0 % (ref 0.0–0.2)

## 2020-06-19 LAB — LIPASE, BLOOD: Lipase: 28 U/L (ref 11–51)

## 2020-06-19 LAB — BRAIN NATRIURETIC PEPTIDE: B Natriuretic Peptide: 2925.1 pg/mL — ABNORMAL HIGH (ref 0.0–100.0)

## 2020-06-19 LAB — MAGNESIUM: Magnesium: 1.8 mg/dL (ref 1.7–2.4)

## 2020-06-19 IMAGING — CT CT ABD-PELV W/O CM
2 of 4 series · 15 of 46 positions shown, 17 images · non-contrast
Comparison: Most recent comparison CT [DATE]

CLINICAL DATA: Acute nonlocalized abdominal pain.  Leg swelling.

EXAM:
CT ABDOMEN AND PELVIS WITHOUT CONTRAST
TECHNIQUE: Multidetector CT imaging of the abdomen and pelvis was performed
following the standard protocol without IV contrast.

[Series 2: routine abd/pel wo · axial · 0.92mm/px · z∈[-570,-80]mm · 12 of 108 slices shown, 14 images]
[im 5/108  soft-tissue]
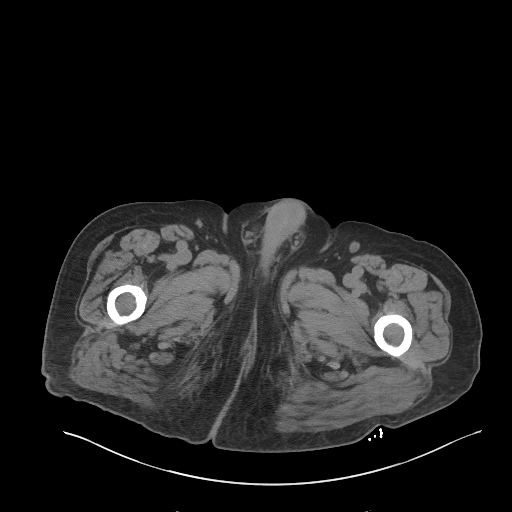
[im 5/108  bone]
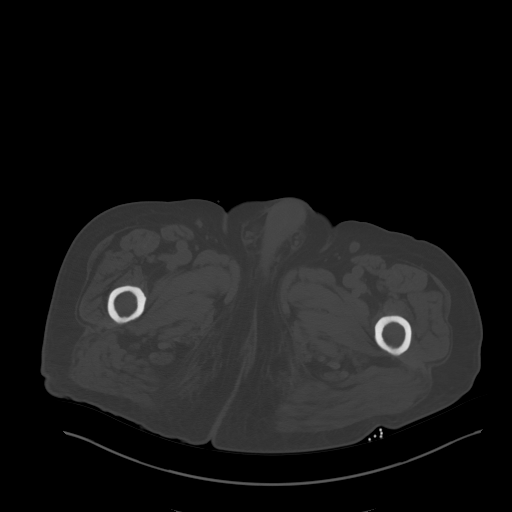
[im 14/108  soft-tissue]
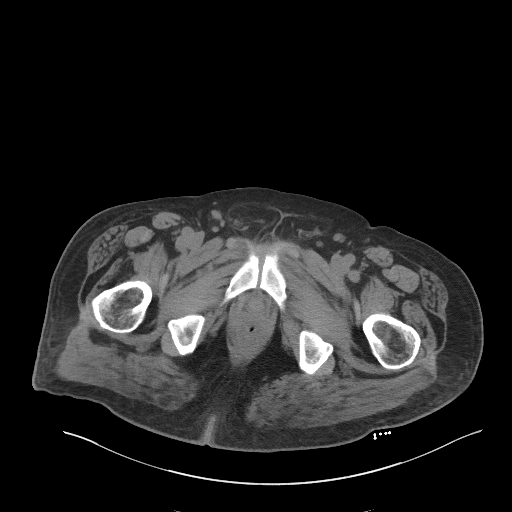
[im 23/108  soft-tissue]
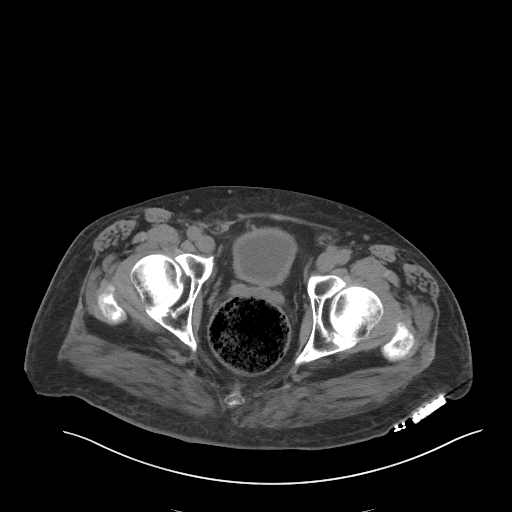
[im 32/108  soft-tissue]
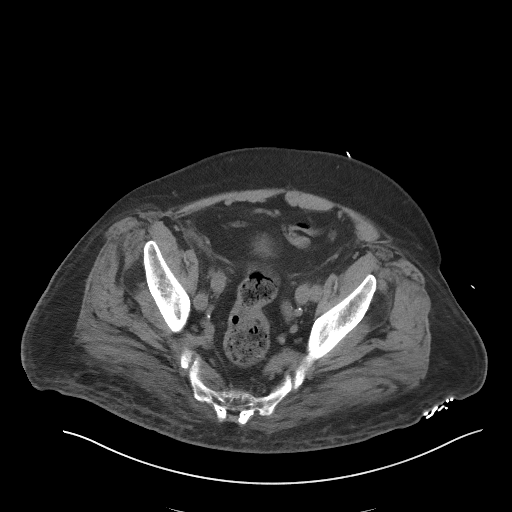
[im 41/108  soft-tissue]
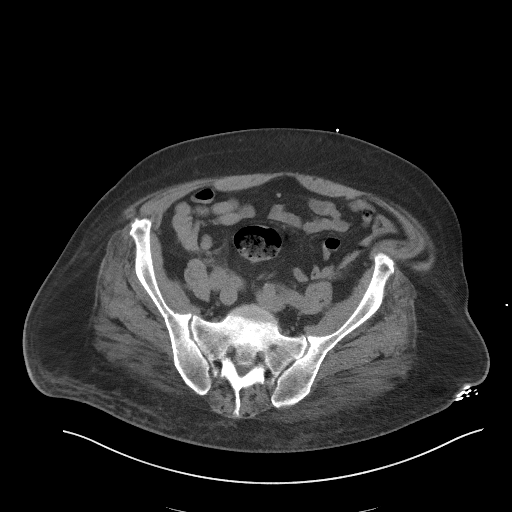
[im 50/108  soft-tissue]
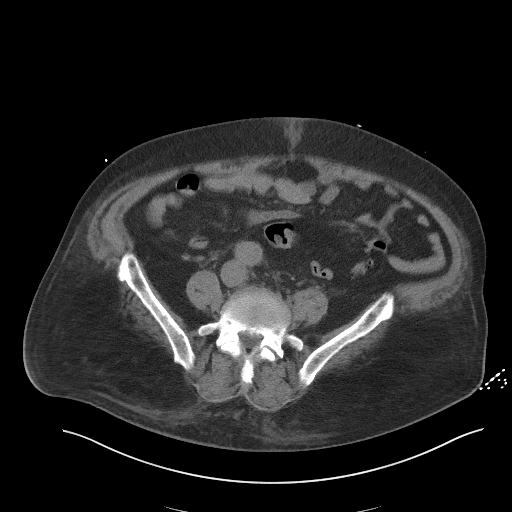
[im 58/108  soft-tissue]
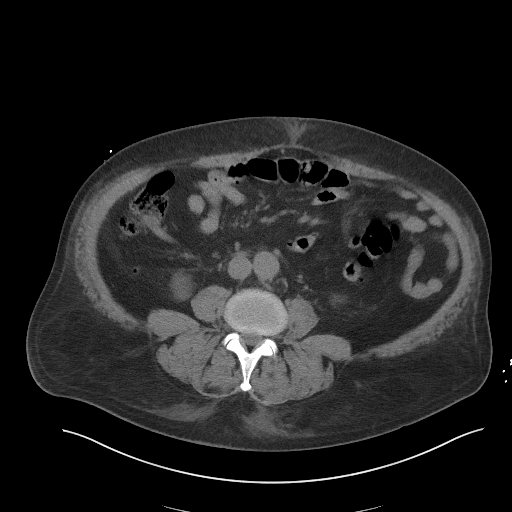
[im 67/108  soft-tissue]
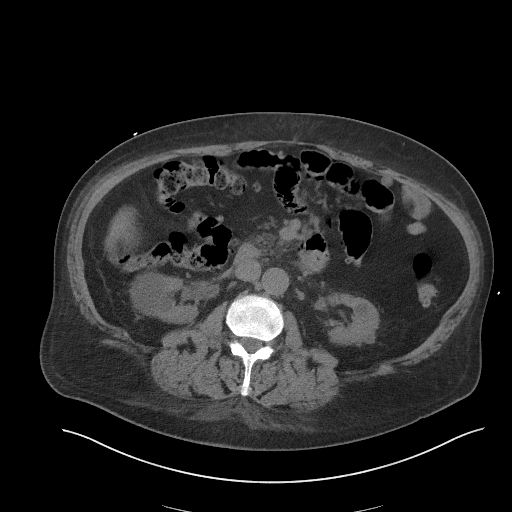
[im 76/108  soft-tissue]
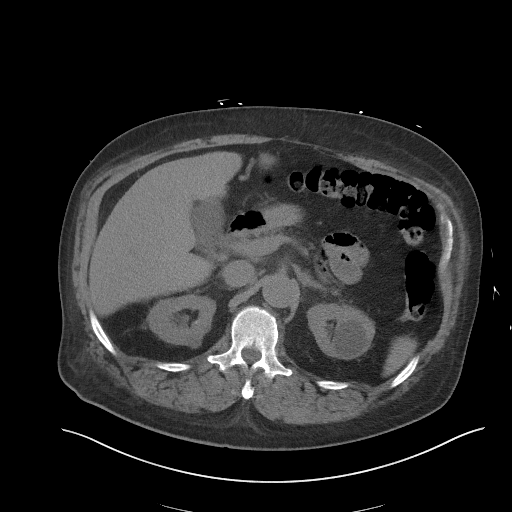
[im 76/108  bone]
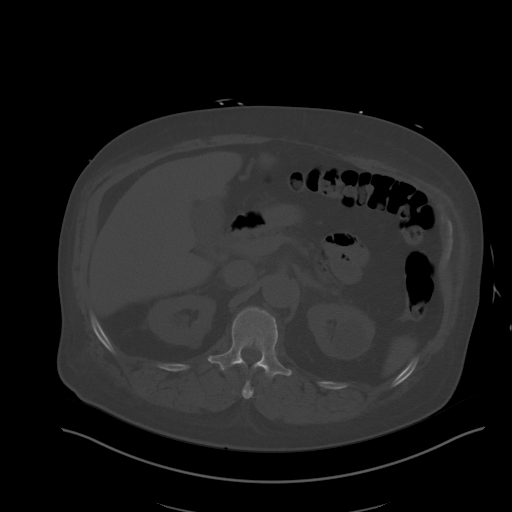
[im 85/108  soft-tissue]
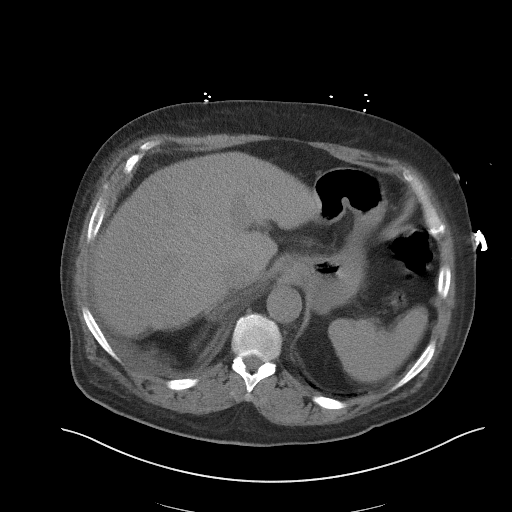
[im 94/108  soft-tissue]
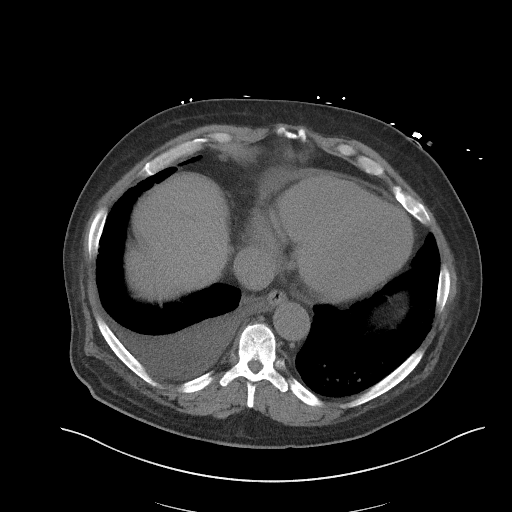
[im 103/108  soft-tissue]
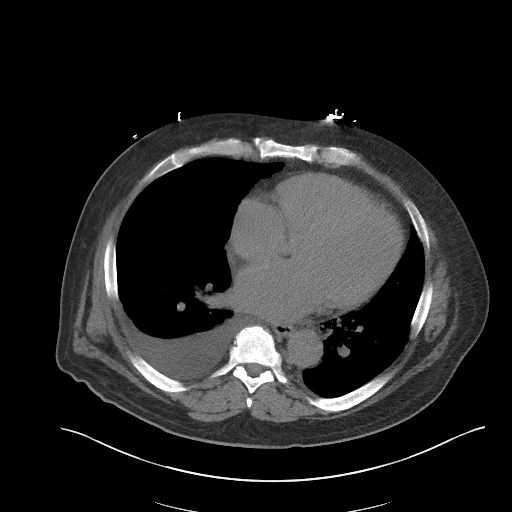

[Series 5: coronal st · coronal · 0.87mm/px · 3 of 103 slices shown]
[im 35/103  soft-tissue]
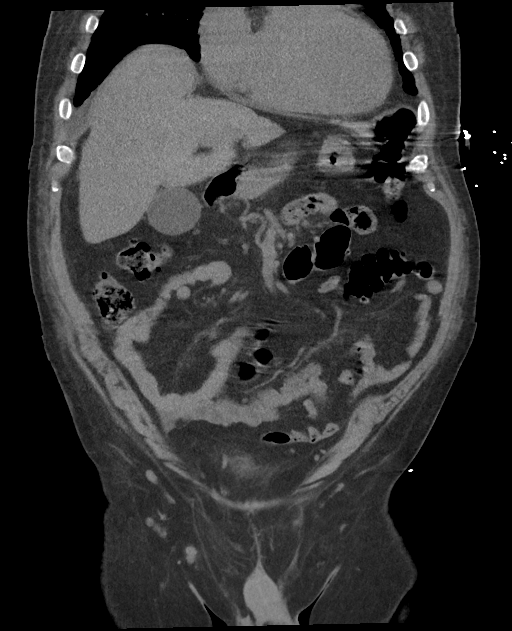
[im 46/103  soft-tissue]
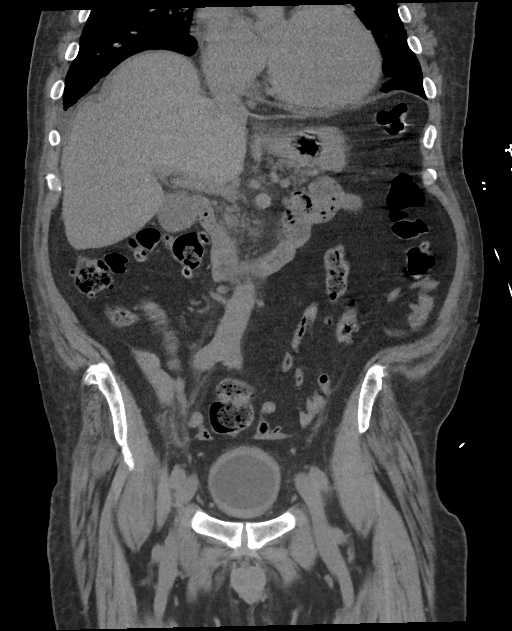
[im 57/103  soft-tissue]
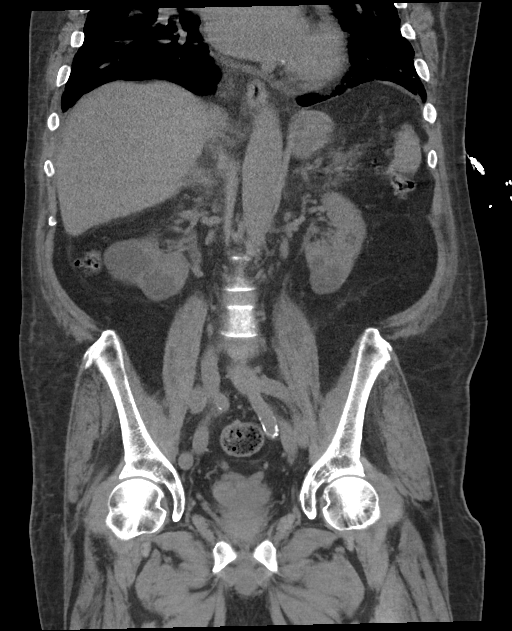

[15 of 46 positions shown; findings below may reference images not displayed]

FINDINGS: Lower chest: Moderate right pleural effusion is partially included
in the field of view. Basilar ground-glass opacities and septal
thickening likely pulmonary edema, partially obscured by motion.
Mild multi chamber cardiomegaly.

Hepatobiliary: No focal liver lesion on this noncontrast exam. Mild
gallbladder distension. No calcified stone and probable sludge in
the gallbladder. No pericholecystic fat stranding or inflammation.
No common bile duct dilatation.

Pancreas: Fatty atrophy of the pancreas. There is motion artifact
which limits assessment for peripancreatic fat stranding. No
pancreatic ductal dilatation.

Spleen: Normal in size without focal abnormality.

Adrenals/Urinary Tract: No adrenal nodule. Slight prominence of the
right renal pelvis is chronic. There is no hydronephrosis. No left
hydronephrosis. Bilateral renal cysts as well as low-density lesions
too small to characterize, partially obscured by motion. No renal
calculi. Urinary bladder is near completely empty. No bladder stone.

Stomach/Bowel: Decompressed stomach. Unremarkable small bowel
without obstruction or inflammation. Normal appendix. Multifocal
colonic diverticulosis. No evidence of diverticulitis. Stool
distends the rectum with rectal distention of 7.6 cm.

Vascular/Lymphatic: No aortic aneurysm. Mild aortic atherosclerosis.
There is questionable edema adjacent to the distal abdominal aorta
and iliac arteries, this is not well assessed given motion through
this region. No portal venous or mesenteric gas. No bulky
abdominopelvic adenopathy.

Reproductive: Prostate is unremarkable.

Other: Small fat containing umbilical hernia. Mild stranding in the
soft tissues adjacent to the hernia. No bowel involvement. There is
also mild generalized dependent subcutaneous edema. There is mild
retroperitoneal edema, tracking into the right greater than left
extraperitoneal pelvic soft tissues. No ascites or free air.

Musculoskeletal: There are no acute or suspicious osseous
abnormalities.
IMPRESSION: 1. Mild retroperitoneal edema, tracking into the right greater than
left extraperitoneal pelvic soft tissues. There is questionable
edema adjacent to the distal abdominal aorta and iliac arteries,
this is not well assessed given motion through this region.
Differential considerations include hypoproteinemia, retroperitoneal
fibrosis or less likely vasculitis. Given soft tissue edema
elsewhere, hypoproteinemia is favored.
2. fat containing umbilical hernia with mild stranding in the soft
tissues adjacent to the hernia. No bowel involvement.
3. Colonic diverticulosis without diverticulitis.
4. Moderate right pleural effusion. Basilar ground-glass opacities
and septal thickening likely pulmonary edema, partially obscured by
motion.

Aortic Atherosclerosis ([G1]-[G1]).

## 2020-06-19 IMAGING — CT CT HEAD W/O CM
3 series · 15 of 47 positions shown, 18 images · non-contrast
Comparison: Head CT [DATE]

CLINICAL DATA: Altered mental status.

EXAM:
CT HEAD WITHOUT CONTRAST
TECHNIQUE: Contiguous axial images were obtained from the base of the skull
through the vertex without intravenous contrast.

[Series 2: head wo · axial · 0.44mm/px · z∈[+238,+363]mm · 9 of 31 slices shown, 12 images]
[im 3/31  brain]
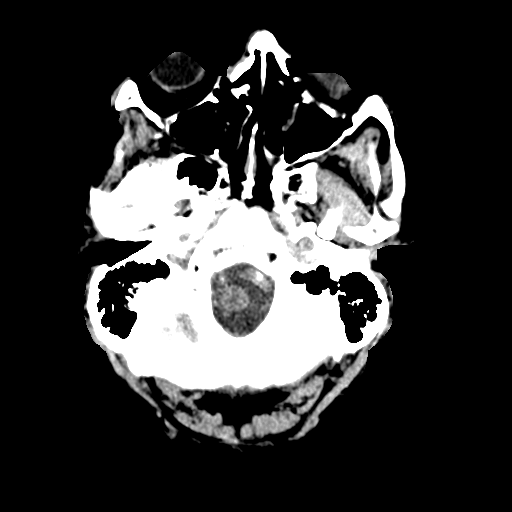
[im 3/31  bone]
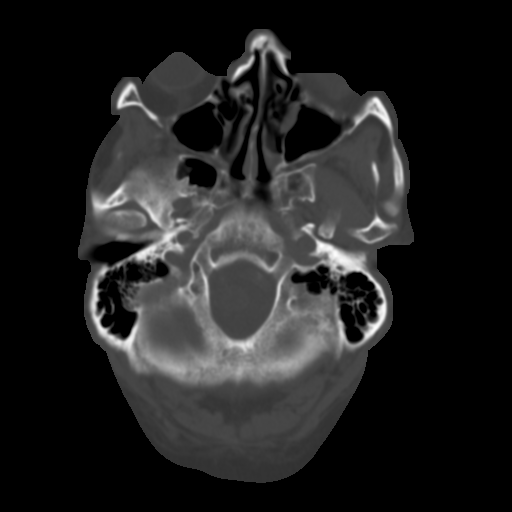
[im 6/31  brain]
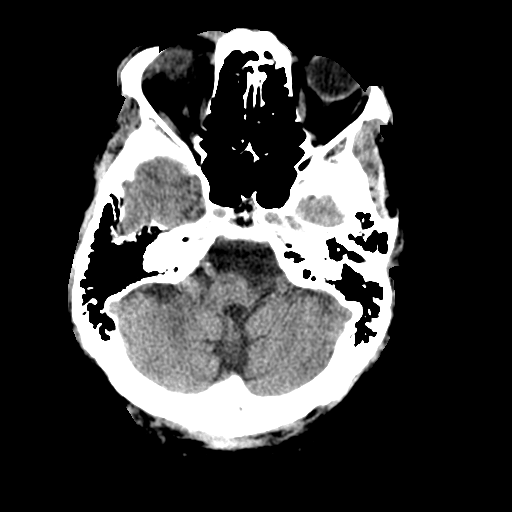
[im 9/31  brain]
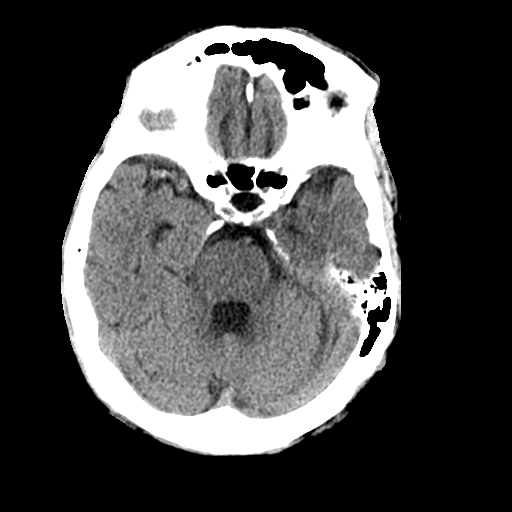
[im 12/31  brain]
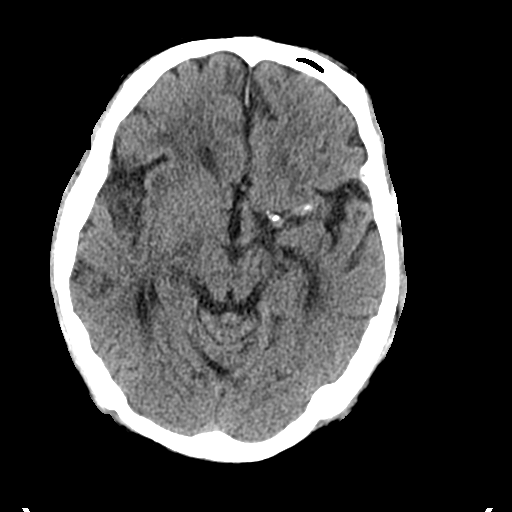
[im 16/31  brain]
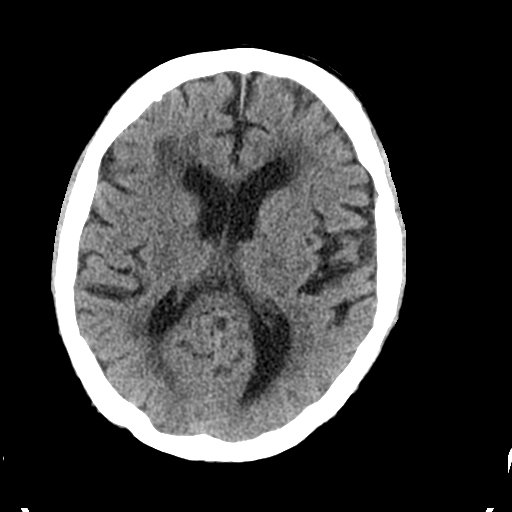
[im 16/31  bone]
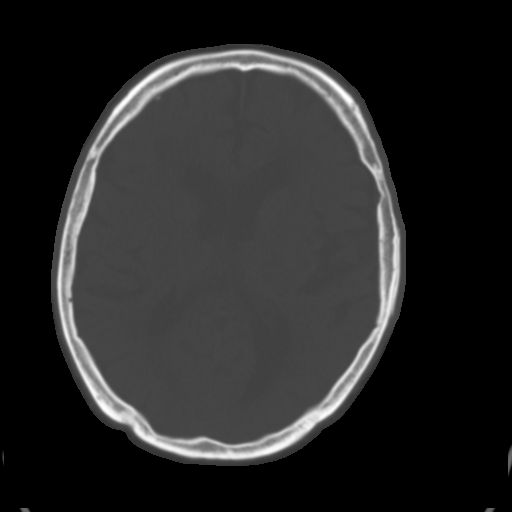
[im 19/31  brain]
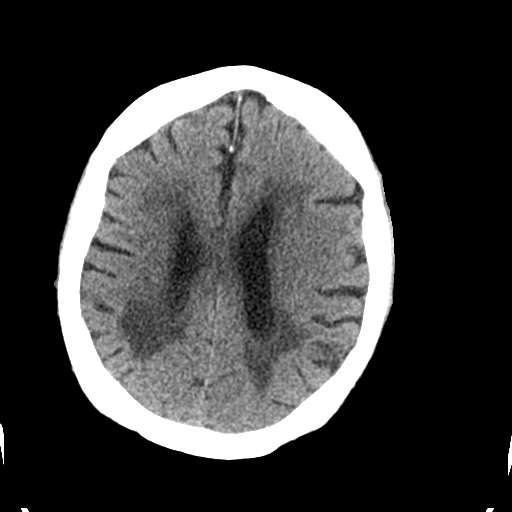
[im 22/31  brain]
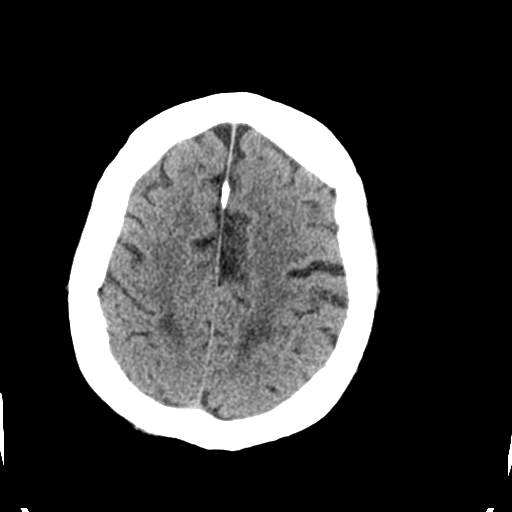
[im 25/31  brain]
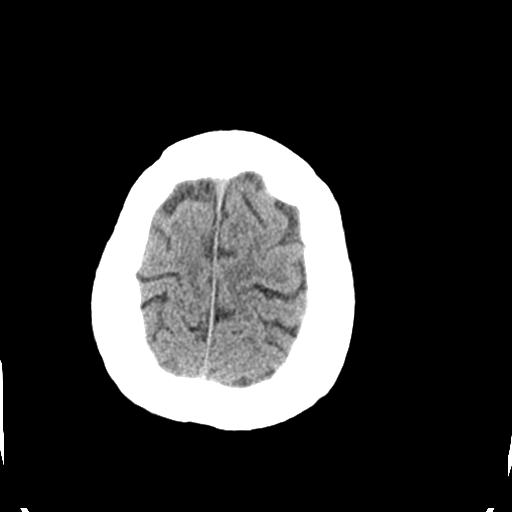
[im 28/31  brain]
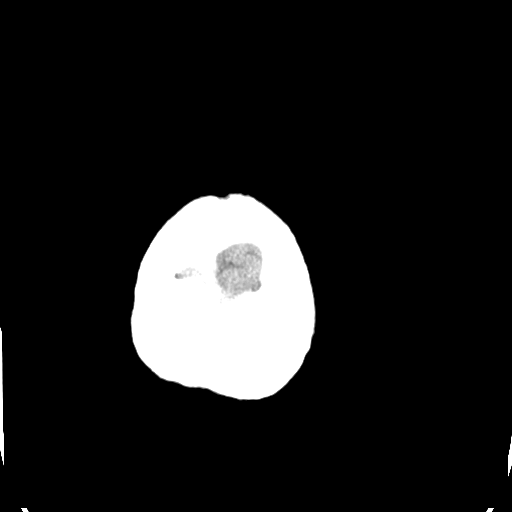
[im 28/31  bone]
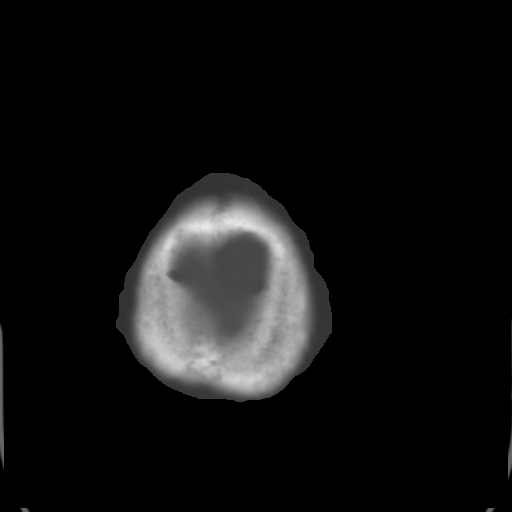

[Series 4: coronal soft tissue · coronal · 0.31mm/px · 3 of 66 slices shown]
[im 22/66  brain]
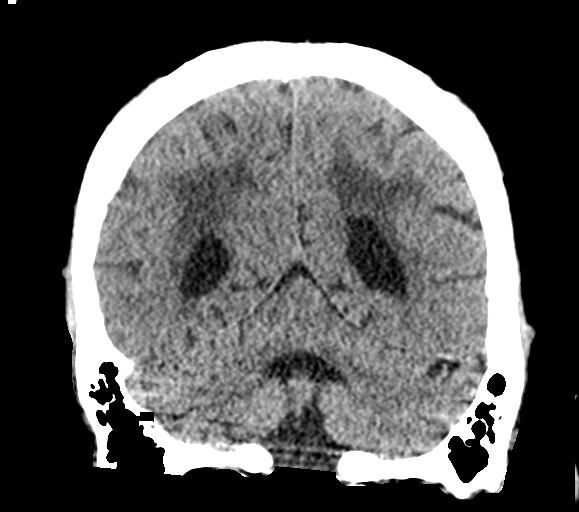
[im 29/66  brain]
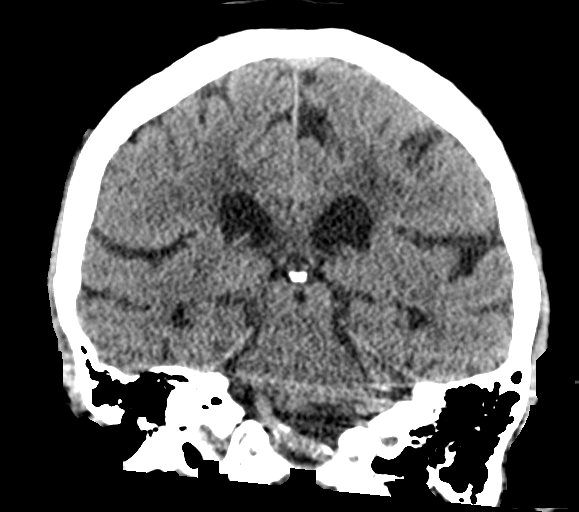
[im 37/66  brain]
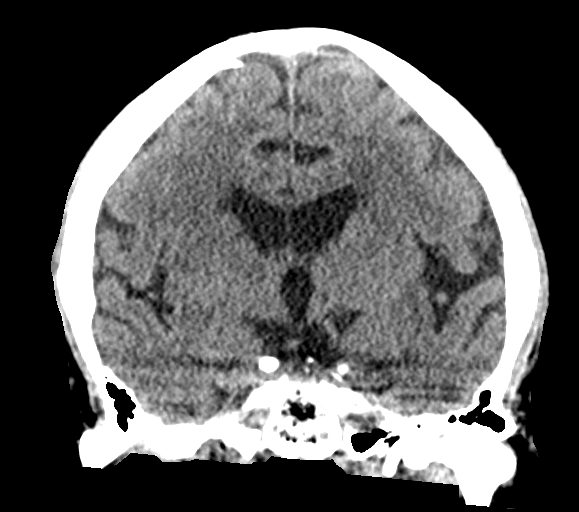

[Series 5: sagittal soft tissue · sagittal · 0.31mm/px · 3 of 55 slices shown]
[im 19/55  brain]
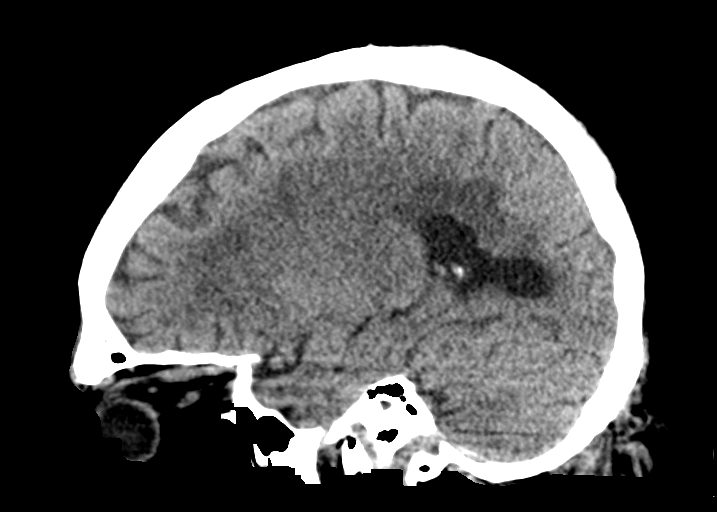
[im 28/55  brain]
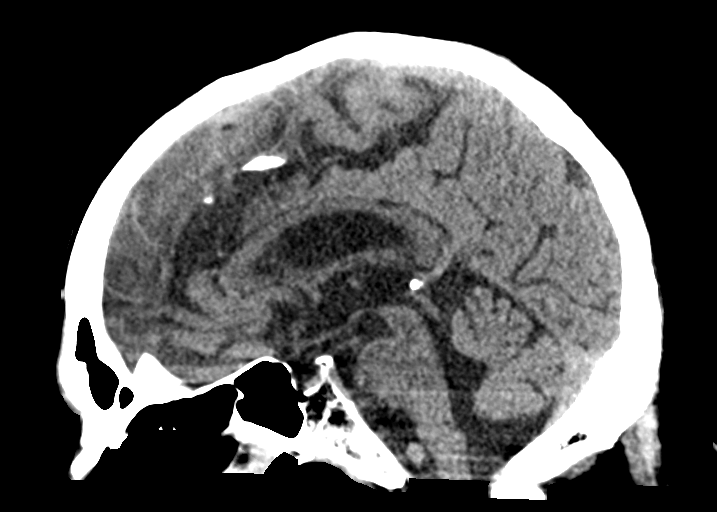
[im 37/55  brain]
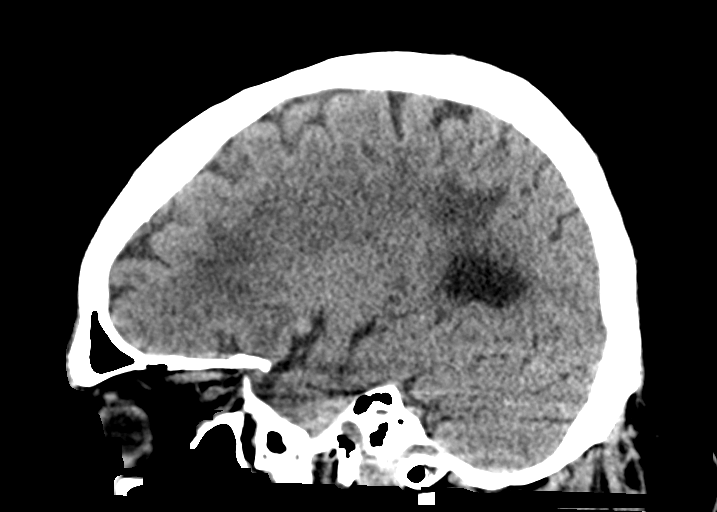

[15 of 47 positions shown; findings below may reference images not displayed]

FINDINGS: Brain: Stable age advanced cerebral atrophy, ventriculomegaly and
periventricular white matter disease. No extra-axial fluid
collections are identified. No CT findings for acute hemispheric
infarction or intracranial hemorrhage. No mass lesions. The
brainstem and cerebellum are normal.

Vascular: Significant age advanced vascular calcifications including
the left middle cerebral artery. No aneurysm hyperdense vessels.

Skull: No skull fracture or bone lesions.

Sinuses/Orbits: The paranasal sinuses and mastoid air cells are
clear. The globes are intact.

Other: No scalp lesions or scalp hematoma.
IMPRESSION: 1. Stable age advanced cerebral atrophy, ventriculomegaly and
periventricular white matter disease.
2. No acute intracranial findings or mass lesions.

## 2020-06-19 IMAGING — MR MR HEAD W/O CM
12 series · 48 of 48 positions shown · non-contrast
Comparison: Head CT [DATE]

CLINICAL DATA: Altered mental status.

EXAM:
MRI HEAD WITHOUT CONTRAST
TECHNIQUE: Multiplanar, multiecho pulse sequences of the brain and surrounding
structures were obtained without intravenous contrast.

[Series 5: ax dwi_tracew · axial · 3.0mm · 0.65mm/px · z∈[-99,+55]mm · 3 of 48 slices shown]
[im 1/48]
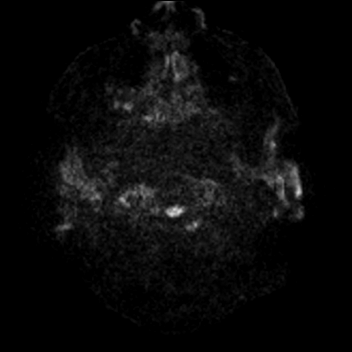
[im 24/48]
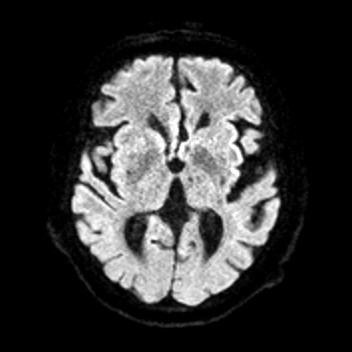
[im 48/48]
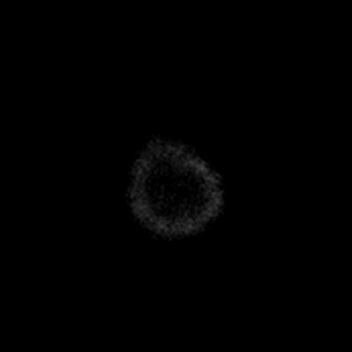

[Series 6: ax dwi_adc · axial · 3.0mm · 0.65mm/px · z∈[-99,+55]mm · 4 of 48 slices shown]
[im 1/48]
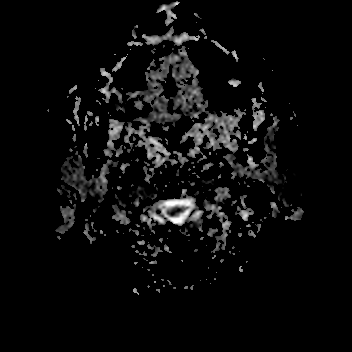
[im 16/48]
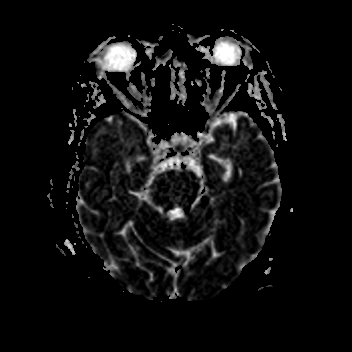
[im 32/48]
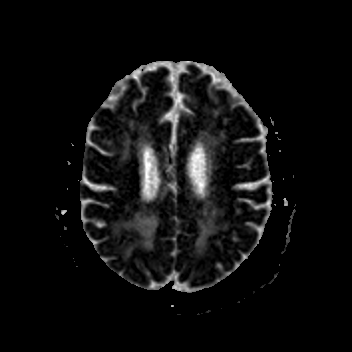
[im 48/48]
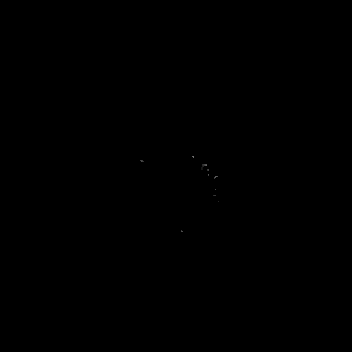

[Series 7: cor dwi_tracew · coronal · 5.0mm · 0.60mm/px · 3 of 38 slices shown]
[im 1/38]
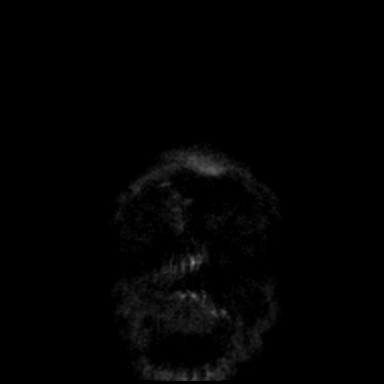
[im 19/38]
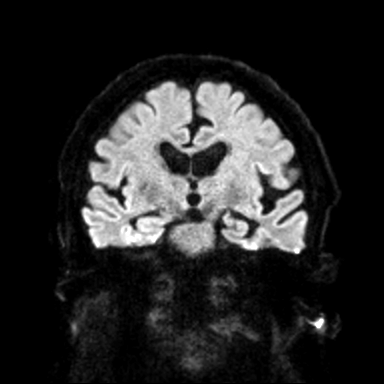
[im 38/38]
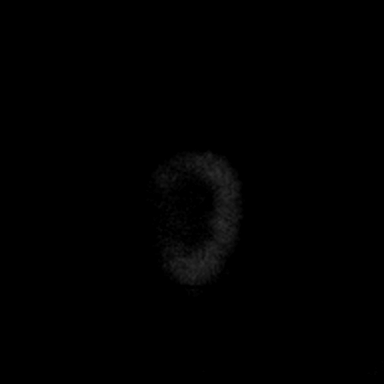

[Series 8: cor dwi_adc · coronal · 5.0mm · 0.60mm/px · 3 of 37 slices shown]
[im 1/37]
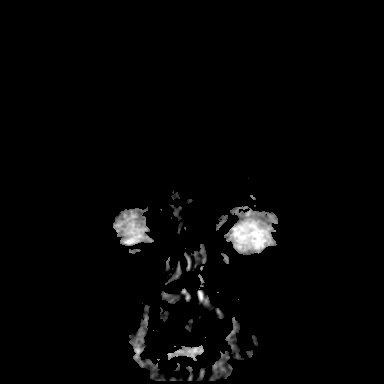
[im 19/37]
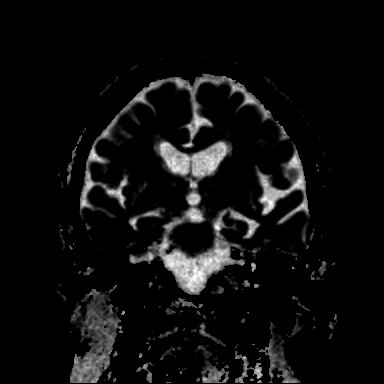
[im 37/37]
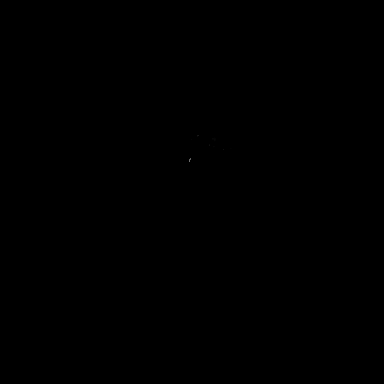

[Series 9: T1 · sagittal · 5.0mm · 0.47mm/px · 2 of 24 slices shown (1 of 2)]
[im 1/24]
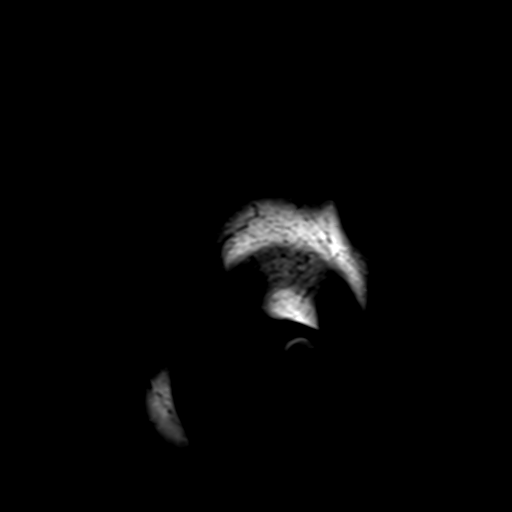
[im 24/24]
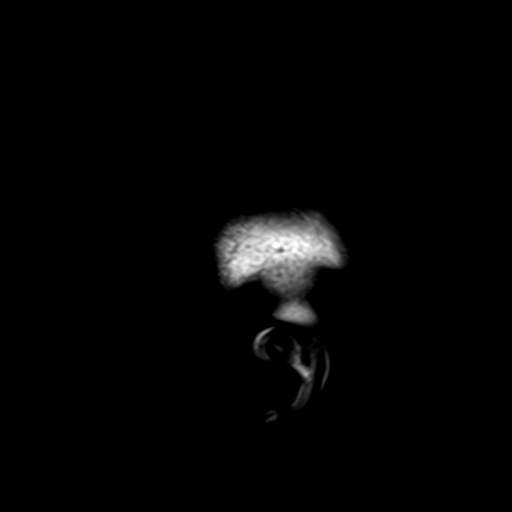

[Series 10: T2 · axial · 5.0mm · 0.86mm/px · z∈[-94,+49]mm · 2 of 25 slices shown (1 of 2)]
[im 1/25]
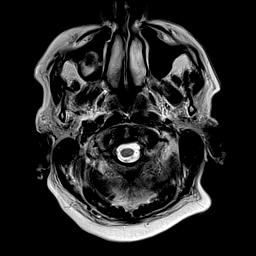
[im 25/25]
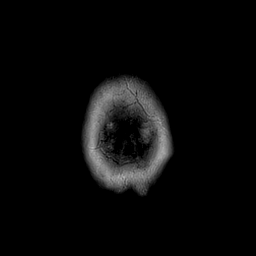

[Series 11: mag_images · axial · 3.0mm · 0.90mm/px · z∈[-97,+55]mm · 4 of 52 slices shown]
[im 1/52]
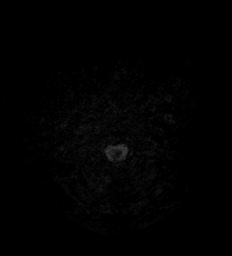
[im 18/52]
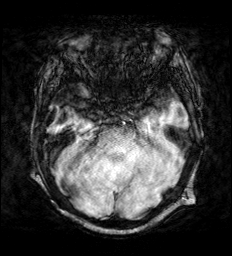
[im 35/52]
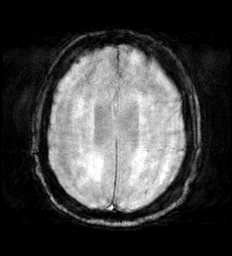
[im 52/52]
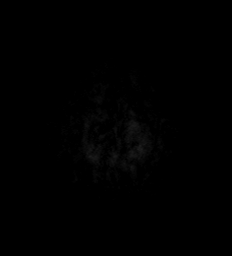

[Series 12: pha_images · axial · 3.0mm · 0.90mm/px · z∈[-94,+55]mm · 4 of 51 slices shown]
[im 1/51]
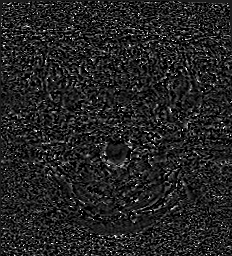
[im 17/51]
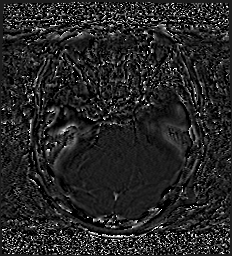
[im 34/51]
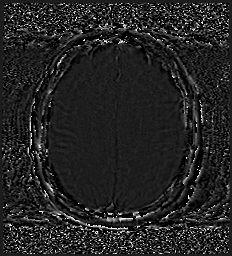
[im 51/51]
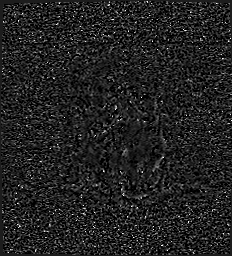

[Series 13: swi_images · axial · 3.0mm · 0.90mm/px · z∈[-97,+55]mm · 4 of 52 slices shown]
[im 1/52]
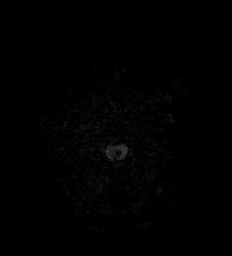
[im 18/52]
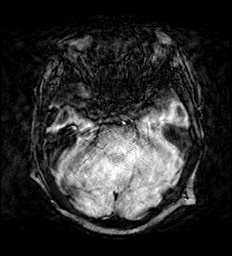
[im 35/52]
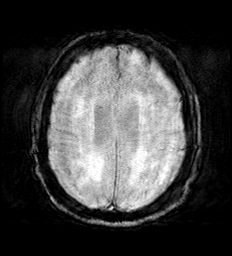
[im 52/52]
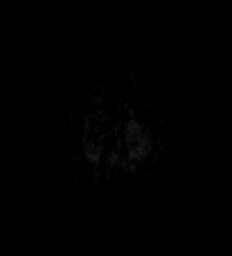

[Series 15: FLAIR · axial · 3.0mm · 0.69mm/px · z∈[-103,+58]mm · 4 of 55 slices shown]
[im 1/55]
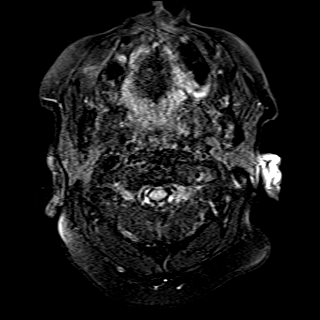
[im 19/55]
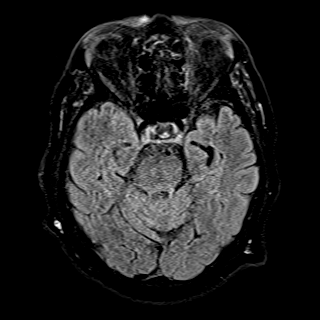
[im 37/55]
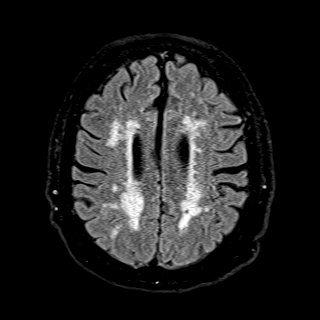
[im 55/55]
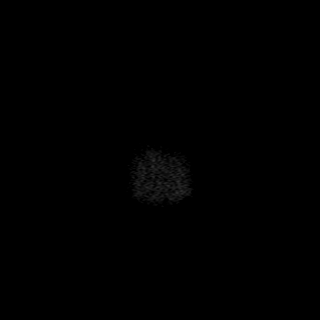

[Series 16: T1 · axial · 1.0mm · 0.98mm/px · z∈[-108,+66]mm · 13 of 176 slices shown (2 of 2)]
[im 1/176]
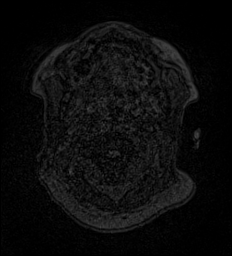
[im 15/176]
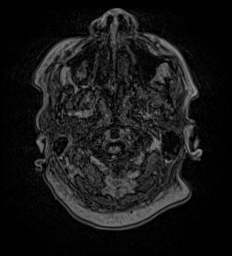
[im 30/176]
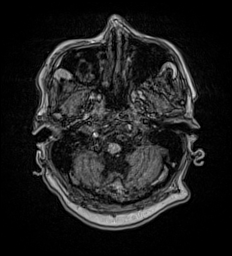
[im 44/176]
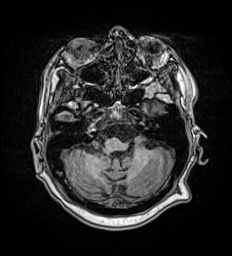
[im 59/176]
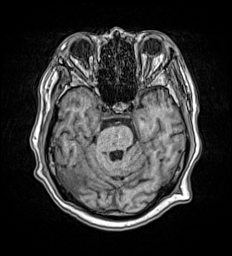
[im 73/176]
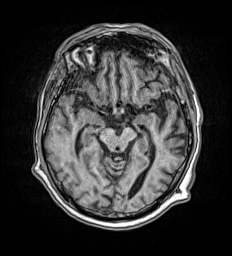
[im 88/176]
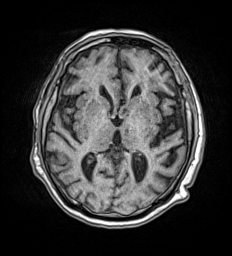
[im 103/176]
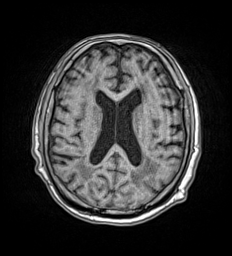
[im 117/176]
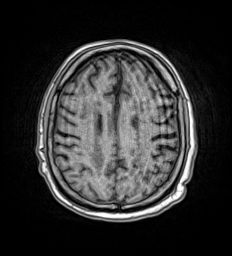
[im 132/176]
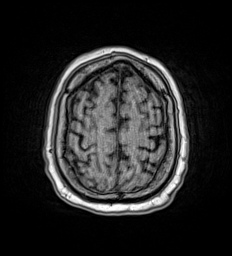
[im 146/176]
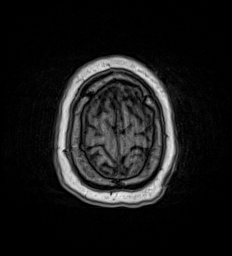
[im 161/176]
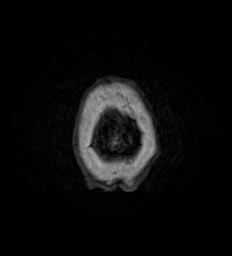
[im 176/176]
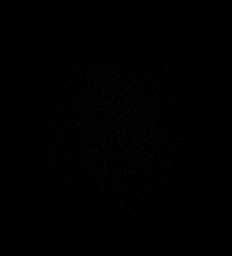

[Series 17: T2 · coronal · 5.0mm · 0.86mm/px · 2 of 30 slices shown (2 of 2)]
[im 1/30]
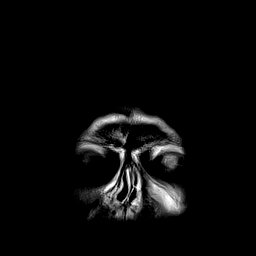
[im 30/30]
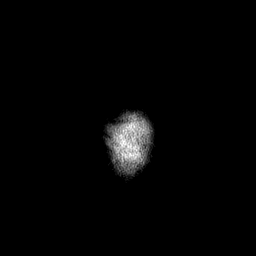

[48 of 48 positions shown; findings below may reference images not displayed]

FINDINGS: Brain: There is no evidence of an acute infarct, mass, midline
shift, or extra-axial fluid collection. Patchy and confluent T2
hyperintensities in the cerebral white matter bilaterally are
nonspecific but compatible with moderately to severely age advanced
chronic small vessel ischemic disease. There are numerous chronic
microhemorrhages in the cerebrum and cerebellum including prominent
involvement of the deep gray nuclei suggesting in etiology of
chronic hypertension. Chronic lacunar infarcts are noted in the left
thalamus and cerebellum. There is moderately age advanced cerebral
atrophy.

Vascular: Major intracranial vascular flow voids are preserved.

Skull and upper cervical spine: Unremarkable bone marrow signal.

Sinuses/Orbits: Unremarkable orbits. 2.5 cm polypoid mass inferiorly
in the right maxillary sinus corresponding to an osteoma on a [76]
maxillofacial CT. Clear mastoid air cells.

Other: None.
IMPRESSION: 1. No acute intracranial abnormality.
2. Age advanced chronic small vessel ischemic disease and cerebral
atrophy.
3. Numerous chronic microhemorrhages suggesting chronic
hypertension.

## 2020-06-19 IMAGING — CR DG CHEST 2V
2 series · 2 of 2 positions shown · non-contrast
Comparison: One-view chest x-ray [DATE]

CLINICAL DATA: Heart failure exacerbation.

EXAM:
CHEST - 2 VIEW

[chest lat]
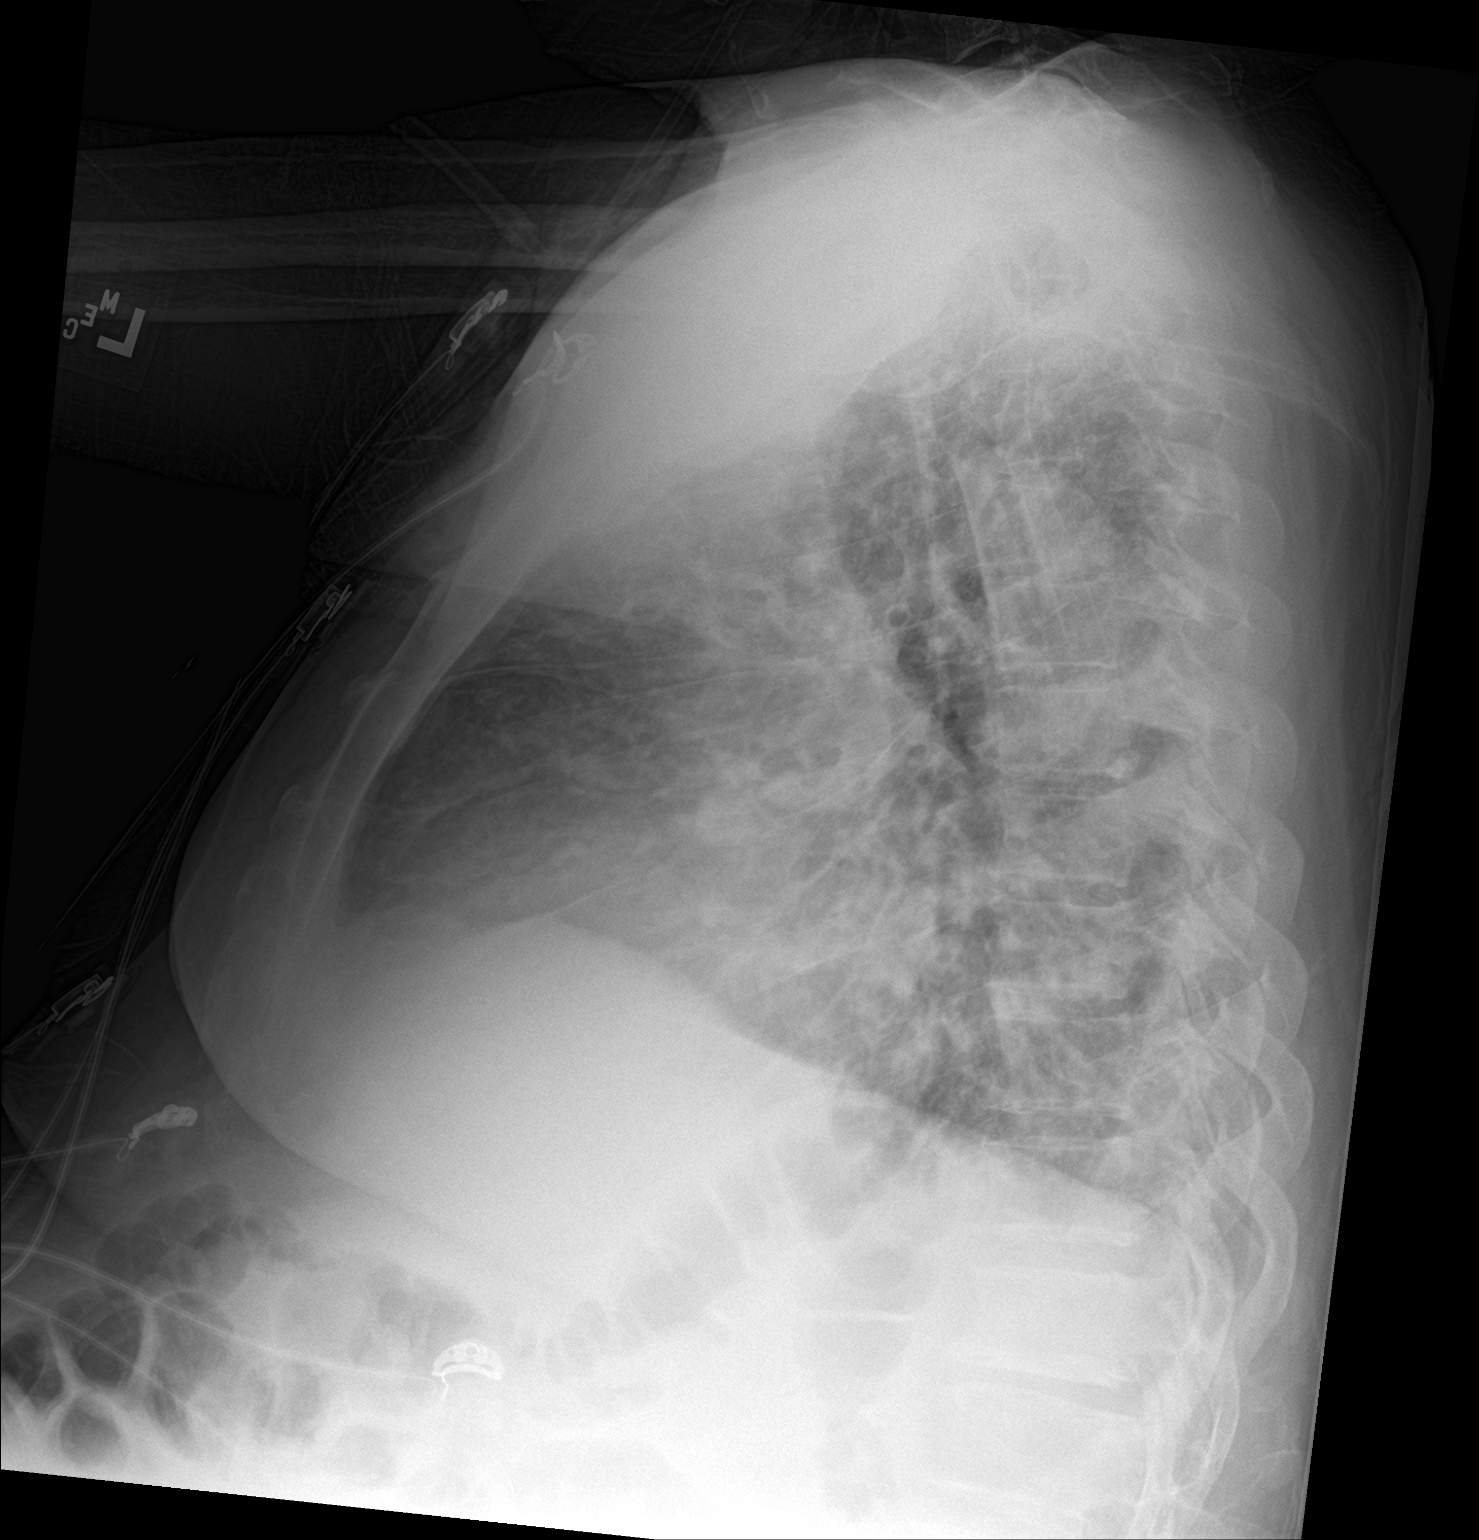

[chest ap]
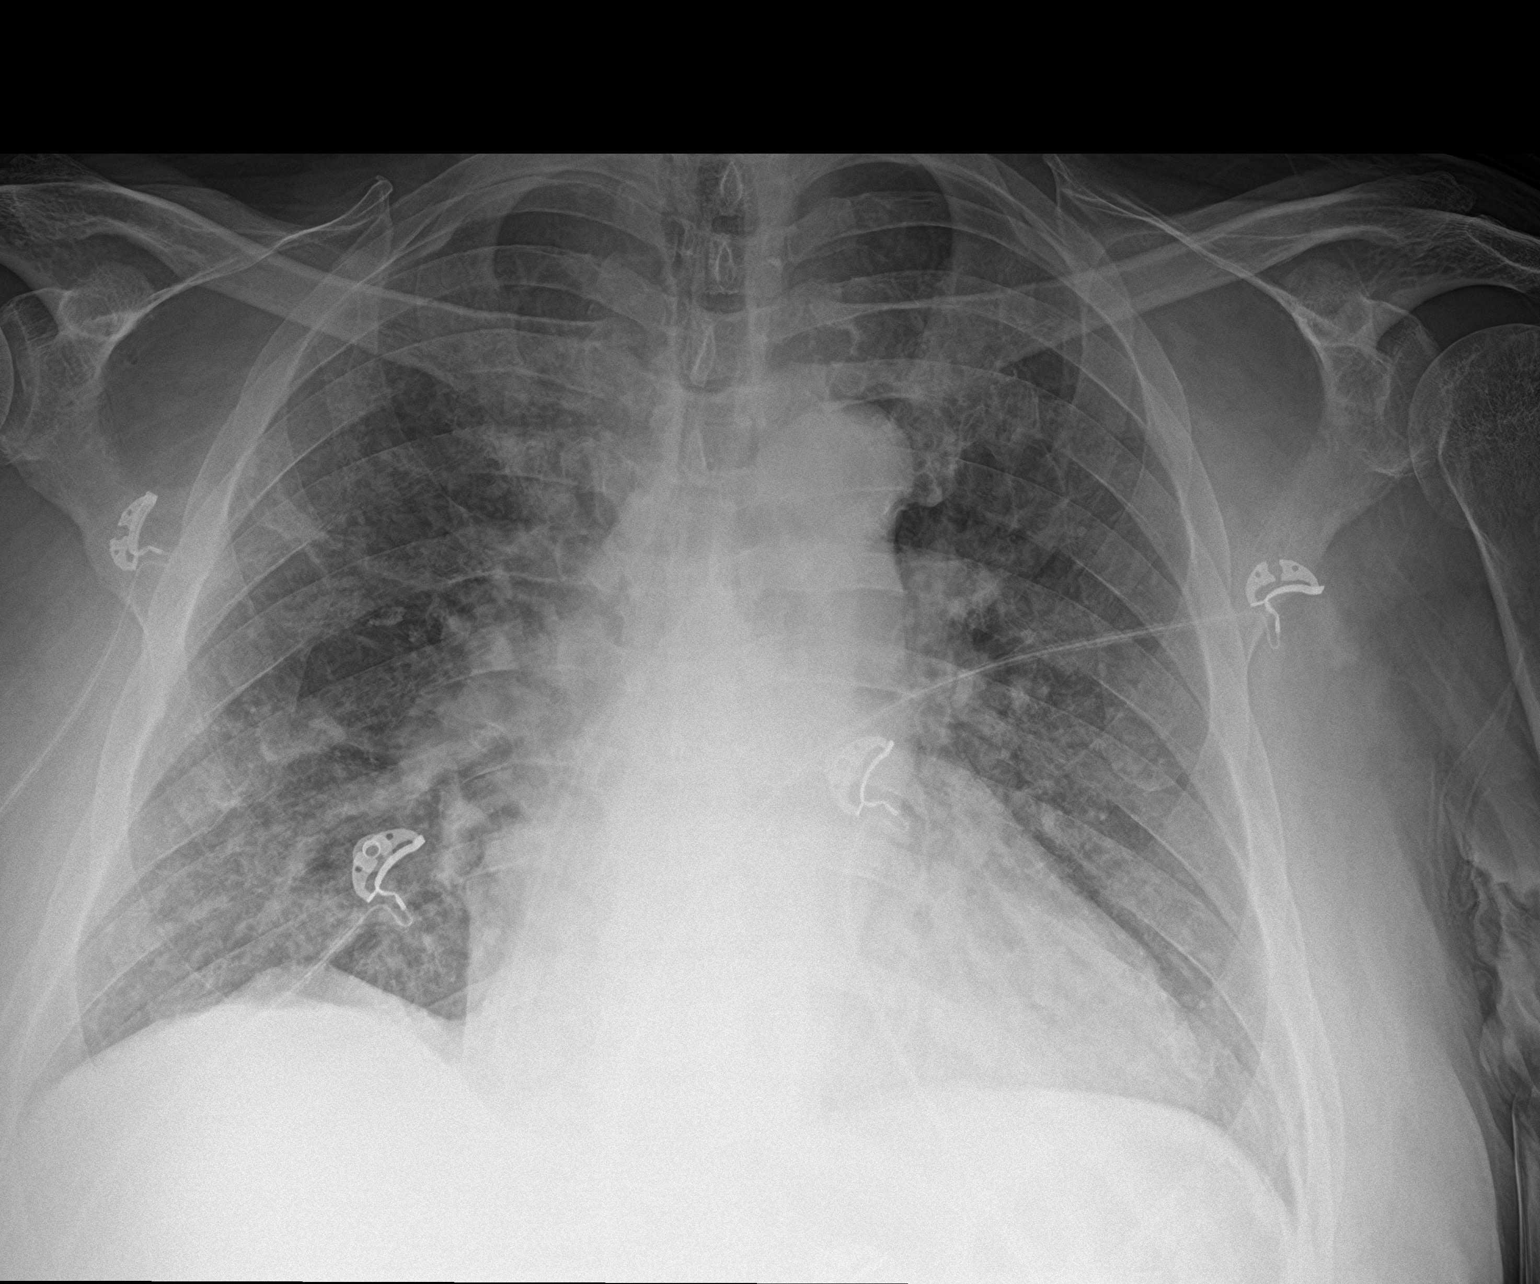

[2 of 2 positions shown; findings below may reference images not displayed]

FINDINGS: Heart is enlarged. Diffuse edema has improved. No definite nodule is
present. No significant airspace consolidation is present.
Atherosclerotic calcifications present at the arch.
IMPRESSION: 1. Improving edema.
2. Cardiomegaly.

## 2020-06-19 MED ORDER — FUROSEMIDE 10 MG/ML IJ SOLN
80.0000 mg | Freq: Once | INTRAMUSCULAR | Status: AC
  Administered 2020-06-19: 80 mg via INTRAVENOUS
  Filled 2020-06-19: qty 8

## 2020-06-19 NOTE — ED Provider Notes (Signed)
[facility] Emergency Department Provider Note  ____________________________________________   Event Date/Time   First MD Initiated Contact with Patient 06/19/20 1519     (approximate)  I have reviewed the triage vital signs and the nursing notes.   HISTORY  Chief Complaint Leg Swelling (Bilateral leg swelling )   HPI Hector [name] is a 55 y.o. 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 fib on Eliquis and metoprolol, CHF, HTN, HDL, and gout who presents for multiple complaints including some change in his left arm numbness slightly increased from baseline over the last 3 to 4 days, 3 to 4 days of left lower quadrant abdominal pain and persistent bilateral lower extremity swelling.  Patient notes he was seen here on 4/24 and started on Lasix 40 mg daily due to some volume overload patient being of his medicines but this does not significantly help.  States he was also seen here yesterday for gout as he has some pain in the last 2 days in his left elbow and wrist although while he no longer has pain but still feels different than usual.  No recent falls injuries, headache, earache, sore throat, cough, chest pain, back pain, other extremity pain, rashes or other acute complaints.  States his shortness of breath is not any worse than usual today.  States he is compliant with all his medications.  States he has not smoked or drank alcohol in several years.         Past Medical History:  Diagnosis Date  . Alcohol abuse   . 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 12/2007 MV: EF 28%, no isch;  b. 8/12 Echo: EF 25-35%; c. 02/2014 Echo: EF 20-25%; d. 12/2014 Cath: minimal CAD; e. 01/2015 Echo: EF 50-55%;  d. 05/2016 Echo: EF 30-35%, diff HK, gr2 DD; e. 11/2016 Echo: EF 45-50%, diff HK.  . Chronic combined systolic (congestive) and diastolic  (congestive) heart failure ([place])    a. 05/2016 Most recent Echo: EF 30-35%, diff HK, Gr2 DD, mild MR, mod dil LA; b. 11/2016 Echo: EF 45-50%, diff HK.  . CKD (chronic kidney disease), stage III ([place])   . Elevated troponin    Chronically elevated. - level was 0.17-0.10 during recent admission  . Essential hypertension   . GI bleed 11/2013  . Hyperlipidemia   . Pancreatitis   . Paroxysmal A-fib ([place])    a. new onset s/p unsuccessful TEE/DCCV on 08/16/2014; b. on amio/eliquis (CHA2DS2VASc = 2-3); c. reports 1-2 hrs of afib ~ q54mo.  . Paroxysmal atrial flutter ([place]    a. new onset 07/2014; b. s/p unsuccessful TEE/DCCV 08/16/2014; c. on apixaban.  . Sleep-disordered breathing    Has yet to have a sleep study    Patient Active Problem List   Diagnosis Date Noted  . Elbow effusion 02/04/2020  . Left elbow pain 02/03/2020  . Hyperthyroidism without thyroid nodule 02/03/2020  . UTI (urinary tract infection) 01/30/2020  . Acute respiratory distress syndrome (ARDS) due to COVID-19 virus ([place] 12/31/2019  . Acute renal failure superimposed on stage 3b chronic kidney disease ([place] 12/31/2019  . Hyperkalemia 12/31/2019  . Hyponatremia 12/31/2019  . Morbidly obese ([place] 12/29/2019  . Pneumonia due to COVID-19 virus 12/22/2019    Class: Acute  . Acute respiratory failure with hypoxemia ([place] 12/22/2019    Class: Acute  . Severe sepsis ([place] 12/22/2019  Class: Acute  . COVID-19 12/22/2019  . Acute on chronic systolic CHF (congestive heart failure) ([place]) 07/21/2016  . Obstructive sleep apnea 04/21/2016  . Nonischemic cardiomyopathy ([place]) 05/26/2015  . Chronic diastolic heart failure ([place]) 04/01/2015  . Essential hypertension 04/01/2015  . Sinus bradycardia 01/29/2015  . Paroxysmal atrial fibrillation ([place]) 08/16/2014  . Paroxysmal atrial flutter ([place]) 08/14/2014    Class: Acute  . Chronic kidney disease (CKD), stage III (moderate) ([place]) 07/02/2014  . Hypertensive heart disease   .  Hyperlipidemia     Past Surgical History:  Procedure Laterality Date  . CARDIAC CATHETERIZATION N/A 01/09/2015   Procedure: Left Heart Cath and Coronary Angiography;  Surgeon: [name], MD;  Location: [facility];  Service: Cardiovascular;  Laterality: N/A;  . ELECTROPHYSIOLOGIC STUDY N/A 08/16/2014   Procedure: CARDIOVERSION;  Surgeon: [name], MD;  Location: [facility] ORS;  Service: Cardiovascular;  Laterality: N/A;  . FLEXIBLE SIGMOIDOSCOPY N/A 10/10/2015   Procedure: FLEXIBLE SIGMOIDOSCOPY;  Surgeon: [name], MD;  Location: [facility] ENDOSCOPY;  Service: Endoscopy;  Laterality: N/A;  . KNEE SURGERY Right   . NM MYOVIEW LTD  November 2011   No ischemia or infarction. EF 50-55% ( no improvement from 2009 Myoview EF of 28%  . TEE WITHOUT CARDIOVERSION N/A 08/16/2014   Procedure: TRANSESOPHAGEAL ECHOCARDIOGRAM (TEE);  Surgeon: [name], MD;  Location: [facility] ORS;  Service: Cardiovascular;  Laterality: N/A;    Prior to Admission medications   Medication Sig Start Date End Date Taking? Authorizing Provider  allopurinol (ZYLOPRIM) 100 MG tablet Take 100 mg by mouth daily as needed.    [provider]  amiodarone (PACERONE) 200 MG tablet Take 1 tablet (200 mg total) by mouth daily. 01/03/18   Alisa Graff, FNP  apixaban (ELIQUIS) 5 MG TABS tablet Take 1 tablet (5 mg total) by mouth 2 (two) times daily. 12/01/18   Alisa Graff, FNP  atorvastatin (LIPITOR) 80 MG tablet Take 1 tablet (80 mg total) by mouth daily. 03/15/18   Alisa Graff, FNP  carvedilol (COREG) 6.25 MG tablet Take 1 tablet (6.25 mg total) by mouth 2 (two) times daily with a meal. 02/07/20   Pokhrel, Laxman, MD  docusate sodium (COLACE) 100 MG capsule Take 100 mg by mouth daily.    [provider]  famotidine (PEPCID) 20 MG tablet Take 20 mg by mouth daily.    [provider]  ferrous gluconate (FERGON) 324 MG tablet Take 324 mg by mouth daily with breakfast.     [provider]  furosemide (LASIX) 40 MG tablet Take 1 tablet (40 mg total) by mouth daily for 15 days. 06/16/20 07/01/20  Carrie Mew, MD  furosemide (LASIX) 40 MG tablet Take 1 tablet (40 mg total) by mouth 2 (two) times daily for 15 days. 06/18/20 07/03/20  Naaman Plummer, MD  gabapentin (NEURONTIN) 100 MG capsule Take 1 capsule (100 mg total) by mouth 3 (three) times daily. 02/07/20   Pokhrel, Corrie Mckusick, MD  hydrALAZINE (APRESOLINE) 25 MG tablet Take 1 tablet (25 mg total) by mouth 4 (four) times daily. 06/18/20 07/18/20  Naaman Plummer, MD  levothyroxine (SYNTHROID) 25 MCG tablet Take 25 mcg by mouth daily before breakfast.    [provider]  Menthol, Topical Analgesic, (BIOFREEZE ROLL-ON COLORLESS) 4 % GEL Apply 1 application topically daily.    [provider]  Nutritional Supplements (FEEDING SUPPLEMENT, NEPRO CARB STEADY,) LIQD Take 237 mLs by mouth 3 (three) times daily between  meals. 01/19/20   Enzo Bi, MD  ondansetron (ZOFRAN) 4 MG tablet Take 4 mg by mouth every 8 (eight) hours as needed for nausea or vomiting.    [provider]  Pollen Extracts (PROSTAT PO) Take 50 mLs by mouth daily.    [provider]  vitamin B-12 (CYANOCOBALAMIN) 500 MCG tablet Take 500 mcg by mouth daily.    [provider]    Allergies Esomeprazole magnesium and Eggs or egg-derived products  Family History  Problem Relation Age of Onset  . Hypertension Mother   . Hyperlipidemia Mother   . Diabetes Mother     Social History Social History   Tobacco Use  . Smoking status: Former Smoker    Packs/day: 1.00    Years: 12.00    Pack years: 12.00    Quit date: 02/23/1998    Years since quitting: 22.3  . Smokeless tobacco: Never Used  Vaping Use  . Vaping Use: Never used  Substance Use Topics  . Alcohol use: No    Alcohol/week: 0.0 standard drinks    Comment: 4 heavy alcohol drinker. Quit 2 years ago.  . Drug use: No    Review of  Systems  Review of Systems  Constitutional: Negative for chills and fever.  HENT: Negative for sore throat.   Eyes: Negative for pain.  Respiratory: Positive for shortness of breath. Negative for cough and stridor.   Cardiovascular: Negative for chest pain.  Gastrointestinal: Negative for vomiting.  Genitourinary: Negative for dysuria.  Musculoskeletal: Negative for myalgias.  Skin: Negative for rash.  Neurological: Positive for sensory change ( numbness in L forearm and hand). Negative for seizures, loss of consciousness and headaches.  Psychiatric/Behavioral: Negative for suicidal ideas.  All other systems reviewed and are negative.     ____________________________________________   PHYSICAL EXAM:  VITAL SIGNS: ED Triage Vitals  Enc Vitals Group     BP --      Pulse Rate 06/19/20 1515 89     Resp 06/19/20 1515 18     Temp 06/19/20 1515 98.5 F (36.9 C)     Temp Source 06/19/20 1515 Oral     SpO2 06/19/20 1515 98 %     Weight --      Height --      Head Circumference --      Peak Flow --      Pain Score 06/19/20 1516 5     Pain Loc --      Pain Edu? --      Excl. in [place]? --    Vitals:   06/19/20 1725 06/19/20 1818  BP: (!) 155/77 (!) 159/89  Pulse: 85 90  Resp: 17 (!) 30  Temp:    SpO2: 98% 100%   Physical Exam Vitals and nursing note reviewed.  Constitutional:      Appearance: He is well-developed.  HENT:     Head: Normocephalic and atraumatic.     Right Ear: External ear normal.     Left Ear: External ear normal.     Nose: Nose normal.  Eyes:     Conjunctiva/sclera: Conjunctivae normal.  Cardiovascular:     Rate and Rhythm: Normal rate and regular rhythm.     Heart sounds: No murmur heard.   Pulmonary:     Effort: Pulmonary effort is normal. No respiratory distress.     Breath sounds: Normal breath sounds.  Abdominal:     Palpations: Abdomen is soft.     Tenderness: There is abdominal  tenderness in the left lower quadrant. There is no right CVA  tenderness or left CVA tenderness.  Musculoskeletal:     Cervical back: Neck supple.     Right lower leg: Edema present.     Left lower leg: Edema present.  Skin:    General: Skin is warm and dry.  Neurological:     Mental Status: He is alert and oriented to person, place, and time.  Psychiatric:        Mood and Affect: Mood normal.     Patient has full and symmetric strength of his bilateral upper and lower extremities.  2+ bilateral radial and DP pulses.  Bilateral lower extremity edema.  Sensation intact in the distribution of the ulnar radial and median nerves in the bilateral upper extremities.  However patient does state this feels different slightly less than the left hand and forearm.  Cranial nerves II through XII grossly intact.  Some very mild tenderness in the left lower quadrant of the abdomen which is otherwise soft nontender throughout. ____________________________________________   LABS (all labs ordered are listed, but only abnormal results are displayed)  Labs Reviewed  CBC WITH DIFFERENTIAL/PLATELET - Abnormal; Notable for the following components:      Result Value   RBC 4.18 (*)    Hemoglobin 12.2 (*)    HCT 38.7 (*)    RDW 18.0 (*)    All other components within normal limits  BRAIN NATRIURETIC PEPTIDE - Abnormal; Notable for the following components:   B Natriuretic Peptide 2,925.1 (*)    All other components within normal limits  URINALYSIS, COMPLETE (UACMP) WITH MICROSCOPIC - Abnormal; Notable for the following components:   Color, Urine YELLOW (*)    APPearance CLEAR (*)    Hgb urine dipstick SMALL (*)    Ketones, ur 5 (*)    Protein, ur >=300 (*)    Leukocytes,Ua TRACE (*)    All other components within normal limits  COMPREHENSIVE METABOLIC PANEL - Abnormal; Notable for the following components:   CO2 19 (*)    BUN 25 (*)    Creatinine, Ser 2.56 (*)    Calcium 8.7 (*)    Total Bilirubin 2.0 (*)    GFR, Estimated 29 (*)    All other components  within normal limits  RESP PANEL BY RT-PCR (FLU A&B, COVID) ARPGX2  LIPASE, BLOOD  MAGNESIUM   ____________________________________________  EKG  Sinus rhythm with ventricular rate of 83, prolonged QTc interval at 514 and a QRS of 121 and some nonspecific changes and leads I and aVL.  No significant changes when compared to ECG on 4/24. ____________________________________________  RADIOLOGY  ED MD interpretation: CT head shows age-related atrophy without evidence of intracranial hemorrhage or other acute intracranial process.  Chest x-ray shows some mild edema and cardiomegaly without full consolidation pneumothorax or effusion.  CT abdomen pelvis remarkable for some retroperitoneal edema and diverticulosis without evidence of diverticulitis as well as a fat-containing buckle hernia and no other clear acute abdominopelvic process.  MRI of brain shows no evidence of acute intracranial stroke or process but evidence of chronic hemorrhages, likely poorly controlled hypertension.  Official radiology report(s): CT ABDOMEN PELVIS WO CONTRAST  Result Date: 06/19/2020 CLINICAL DATA:  Acute nonlocalized abdominal pain.  Leg swelling. EXAM: CT ABDOMEN AND PELVIS WITHOUT CONTRAST TECHNIQUE: Multidetector CT imaging of the abdomen and pelvis was performed following the standard protocol without IV contrast. COMPARISON:  Most recent comparison CT 11/26/2013 FINDINGS: Lower chest: Moderate right pleural effusion  is partially included in the field of view. Basilar ground-glass opacities and septal thickening likely pulmonary edema, partially obscured by motion. Mild multi chamber cardiomegaly. Hepatobiliary: No focal liver lesion on this noncontrast exam. Mild gallbladder distension. No calcified stone and probable sludge in the gallbladder. No pericholecystic fat stranding or inflammation. No common bile duct dilatation. Pancreas: Fatty atrophy of the pancreas. There is motion artifact which limits assessment  for peripancreatic fat stranding. No pancreatic ductal dilatation. Spleen: Normal in size without focal abnormality. Adrenals/Urinary Tract: No adrenal nodule. Slight prominence of the right renal pelvis is chronic. There is no hydronephrosis. No left hydronephrosis. Bilateral renal cysts as well as low-density lesions too small to characterize, partially obscured by motion. No renal calculi. Urinary bladder is near completely empty. No bladder stone. Stomach/Bowel: Decompressed stomach. Unremarkable small bowel without obstruction or inflammation. Normal appendix. Multifocal colonic diverticulosis. No evidence of diverticulitis. Stool distends the rectum with rectal distention of 7.6 cm. Vascular/Lymphatic: No aortic aneurysm. Mild aortic atherosclerosis. There is questionable edema adjacent to the distal abdominal aorta and iliac arteries, this is not well assessed given motion through this region. No portal venous or mesenteric gas. No bulky abdominopelvic adenopathy. Reproductive: Prostate is unremarkable. Other: Small fat containing umbilical hernia. Mild stranding in the soft tissues adjacent to the hernia. No bowel involvement. There is also mild generalized dependent subcutaneous edema. There is mild retroperitoneal edema, tracking into the right greater than left extraperitoneal pelvic soft tissues. No ascites or free air. Musculoskeletal: There are no acute or suspicious osseous abnormalities. IMPRESSION: 1. Mild retroperitoneal edema, tracking into the right greater than left extraperitoneal pelvic soft tissues. There is questionable edema adjacent to the distal abdominal aorta and iliac arteries, this is not well assessed given motion through this region. Differential considerations include hypoproteinemia, retroperitoneal fibrosis or less likely vasculitis. Given soft tissue edema elsewhere, hypoproteinemia is favored. 2. fat containing umbilical hernia with mild stranding in the soft tissues adjacent  to the hernia. No bowel involvement. 3. Colonic diverticulosis without diverticulitis. 4. Moderate right pleural effusion. Basilar ground-glass opacities and septal thickening likely pulmonary edema, partially obscured by motion. Aortic Atherosclerosis (ICD10-I70.0). Electronically Signed   By: Keith Rake M.D.   On: 06/19/2020 16:01   DG Chest 2 View  Result Date: 06/19/2020 CLINICAL DATA:  Heart failure exacerbation. EXAM: CHEST - 2 VIEW COMPARISON:  One-view chest x-ray 01/16/2021 FINDINGS: Heart is enlarged. Diffuse edema has improved. No definite nodule is present. No significant airspace consolidation is present. Atherosclerotic calcifications present at the arch. IMPRESSION: 1. Improving edema. 2. Cardiomegaly. Electronically Signed   By: San Morelle M.D.   On: 06/19/2020 16:07   CT Head Wo Contrast  Result Date: 06/19/2020 CLINICAL DATA:  Altered mental status. EXAM: CT HEAD WITHOUT CONTRAST TECHNIQUE: Contiguous axial images were obtained from the base of the skull through the vertex without intravenous contrast. COMPARISON:  Head CT 12/23/2019 FINDINGS: Brain: Stable age advanced cerebral atrophy, ventriculomegaly and periventricular white matter disease. No extra-axial fluid collections are identified. No CT findings for acute hemispheric infarction or intracranial hemorrhage. No mass lesions. The brainstem and cerebellum are normal. Vascular: Significant age advanced vascular calcifications including the left middle cerebral artery. No aneurysm hyperdense vessels. Skull: No skull fracture or bone lesions. Sinuses/Orbits: The paranasal sinuses and mastoid air cells are clear. The globes are intact. Other: No scalp lesions or scalp hematoma. IMPRESSION: 1. Stable age advanced cerebral atrophy, ventriculomegaly and periventricular white matter disease. 2. No acute intracranial findings or mass  lesions. Electronically Signed   By: Marijo Sanes M.D.   On: 06/19/2020 16:02   MR BRAIN  WO CONTRAST  Result Date: 06/19/2020 CLINICAL DATA:  Altered mental status. EXAM: MRI HEAD WITHOUT CONTRAST TECHNIQUE: Multiplanar, multiecho pulse sequences of the brain and surrounding structures were obtained without intravenous contrast. COMPARISON:  Head CT 06/19/2020 FINDINGS: Brain: There is no evidence of an acute infarct, mass, midline shift, or extra-axial fluid collection. Patchy and confluent T2 hyperintensities in the cerebral white matter bilaterally are nonspecific but compatible with moderately to severely age advanced chronic small vessel ischemic disease. There are numerous chronic microhemorrhages in the cerebrum and cerebellum including prominent involvement of the deep gray nuclei suggesting in etiology of chronic hypertension. Chronic lacunar infarcts are noted in the left thalamus and cerebellum. There is moderately age advanced cerebral atrophy. Vascular: Major intracranial vascular flow voids are preserved. Skull and upper cervical spine: Unremarkable bone marrow signal. Sinuses/Orbits: Unremarkable orbits. 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 Clear mastoid air cells. Other: None. IMPRESSION: 1. No acute intracranial abnormality. 2. Age advanced chronic small vessel ischemic disease and cerebral atrophy. 3. Numerous chronic microhemorrhages suggesting chronic hypertension. Electronically Signed   By: Logan Bores M.D.   On: 06/19/2020 18:15    ____________________________________________   PROCEDURES  Procedure(s) performed (including Critical Care):  .1-3 Lead EKG Interpretation Performed by: Lucrezia Starch, MD Authorized by: Lucrezia Starch, MD     Interpretation: normal     ECG rate assessment: normal     Rhythm: sinus rhythm     Ectopy: none     Conduction: normal       ____________________________________________   INITIAL IMPRESSION / ASSESSMENT AND PLAN / ED COURSE       Patient presents with above-stated history exam for assessment of several complaints including some worsening numbness in his left forearm.  He has numbness from prior stroke, left lower quadrant abdominal pain and persistent worsening swelling in his bilateral lower extremities.  On arrival he is hypertensive with a BP of 165/112 with otherwise stable vital signs on room air.  On exam he has no objective focal neurological deficits although does report decreased sensation in his left forearm.  He has some mild tenderness in 4 quadrants abdomen and bilateral lower extremity edema without evidence of significant asymmetry infection septic joint or trauma.  With regard to left forearm numbness differential includes CVA peripheral neuropathy.  He does have some sensation intact to light touch and has good perfusion on exam.  No evidence of trauma or infection.  CT head does not show acute CVA MRI obtained to rule out acute stroke as is negative.  Suspect likely peripheral neuropathy.  MRI of brain shows no evidence of acute intracranial stroke or process but evidence of chronic hemorrhages, likely poorly controlled hypertension.  With regard to his left lower quadrant abdominal pain differential includes kidney stone, diverticulitis, MSK, pancreatitis, AAA, and cystitis.  No tenderness in the right upper quadrant cholecystitis.  No tenderness in right lower quadrant to suggest appendicitis.  CT abdomen pelvis remarkable for some retroperitoneal edema possibly secondary to some hypoproteinemia versus some fibrosis.  There is also a fat-containing umbilical hernia but no involvement of the bowel.  No evidence of diverticulitis or other clear acute intrathoracic process.  Inferior lung windows show moderate right pleural effusion and some groundglass opacities and septal thickening likely pulm edema bilaterally.  There is  evidence of aortic atherosclerosis but no AAA.  Lipase 28 not consistent with acute  pancreatitis  Ms. Eulas Post is lower extremity swelling suspect this is likely secondary to some persistent volume overload in setting of heart failure patient only recently getting started on 40 mg of Lasix per patient.  Most chest x-ray does not show significant edema and he is not hypoxic he does have cardiomegaly and last noted on chest x-ray 4/24.  His BNP is elevated 2925 from 3 days ago when it was 2794.  CBC shows no leukocytosis and hemoglobin at baseline.  CMP shows creatinine at baseline with no other acute significant derangements.  On reassessment patient's SPO2 is 100% and associated 18.  Advised him that I suspected his abdominals comfort lower extremity swelling likely secondary to volume overload I suspect his left arm numbness may be secondary to possible neuropathy otherwise no signs of an acute stroke.  Advised him to follow-up with his PCP in heart failure clinic in the next 24 hours if he is unable to do so he should return to emergency room given stable vitals laceration exam work-up I think discharge is reasonable with plan for close outpatient follow-up.  Strict return precautions advised discussed.  Discharged stable condition.      ____________________________________________   FINAL CLINICAL IMPRESSION(S) / ED DIAGNOSES  Final diagnoses:  Acute on chronic congestive heart failure, unspecified heart failure type (HCC)  Left arm numbness  Left lower quadrant abdominal pain    Medications  furosemide (LASIX) injection 80 mg (80 mg Intravenous Given 06/19/20 1728)     ED Discharge Orders         Ordered    AMB referral to CHF clinic        06/19/20 1822           Note:  This document was prepared using Dragon voice recognition software and may include unintentional dictation errors.   Lucrezia Starch, MD 06/19/20 [phone]

## 2020-06-19 NOTE — ED Triage Notes (Signed)
Seen here recently, bilateral leg swelling , no cp, pt axox4

## 2020-06-20 ENCOUNTER — Encounter: Payer: Self-pay | Admitting: Emergency Medicine

## 2020-06-20 ENCOUNTER — Emergency Department
Admission: EM | Admit: 2020-06-20 | Discharge: 2020-06-20 | Disposition: A | Discharge status: Home / Self Care | Payer: Medicaid Other | Attending: Physician Assistant | Admitting: Physician Assistant

## 2020-06-20 ENCOUNTER — Other Ambulatory Visit: Payer: Self-pay

## 2020-06-20 DIAGNOSIS — Z9861 Coronary angioplasty status: Secondary | ICD-10-CM | POA: Diagnosis not present

## 2020-06-20 DIAGNOSIS — Z87891 Personal history of nicotine dependence: Secondary | ICD-10-CM | POA: Insufficient documentation

## 2020-06-20 DIAGNOSIS — R200 Anesthesia of skin: Secondary | ICD-10-CM | POA: Insufficient documentation

## 2020-06-20 DIAGNOSIS — I130 Hypertensive heart and chronic kidney disease with heart failure and stage 1 through stage 4 chronic kidney disease, or unspecified chronic kidney disease: Secondary | ICD-10-CM | POA: Insufficient documentation

## 2020-06-20 DIAGNOSIS — Z79899 Other long term (current) drug therapy: Secondary | ICD-10-CM | POA: Insufficient documentation

## 2020-06-20 DIAGNOSIS — I5042 Chronic combined systolic (congestive) and diastolic (congestive) heart failure: Secondary | ICD-10-CM | POA: Insufficient documentation

## 2020-06-20 DIAGNOSIS — Z4502 Encounter for adjustment and management of automatic implantable cardiac defibrillator: Secondary | ICD-10-CM | POA: Diagnosis not present

## 2020-06-20 DIAGNOSIS — Z8616 Personal history of COVID-19: Secondary | ICD-10-CM | POA: Insufficient documentation

## 2020-06-20 DIAGNOSIS — N183 Chronic kidney disease, stage 3 unspecified: Secondary | ICD-10-CM | POA: Insufficient documentation

## 2020-06-20 HISTORY — DX: Cerebral infarction, unspecified: I63.9

## 2020-06-20 MED ORDER — GABAPENTIN 100 MG PO CAPS: 200.0000 mg | ORAL_CAPSULE | Freq: Three times a day (TID) | ORAL | 1 refills | Status: DC

## 2020-06-20 MED ORDER — HYDROCODONE-ACETAMINOPHEN 5-325 MG PO TABS
1.0000 | ORAL_TABLET | Freq: Once | ORAL | Status: AC
  Administered 2020-06-20: 1 via ORAL
  Filled 2020-06-20: qty 1

## 2020-06-20 MED ORDER — GABAPENTIN 600 MG PO TABS
300.0000 mg | ORAL_TABLET | Freq: Once | ORAL | Status: AC
  Administered 2020-06-20: 300 mg via ORAL
  Filled 2020-06-20: qty 1

## 2020-06-20 NOTE — Discharge Instructions (Addendum)
You should restart your gabapentin as previously prescribed for your neuropathic pain.  I have increased your dose from 100 mg to 200 mg 3 times a day.  Follow-up with your primary provider for reevaluation and refills of all of your maintenance medications.

## 2020-06-20 NOTE — ED Notes (Signed)
Patient having difficulty finding a ride home r/t no contacts in his phone. Agricultural consultant notified. Patient discharged to lobby pending a ride home. Patient ambulatory with cane. Patient transported in a wheelchair to lobby to wait.

## 2020-06-20 NOTE — ED Notes (Signed)
Patient reports he is attempting to contact someone for a ride home. This RN attempted to call cab company at patient request - no cabs available at this time.

## 2020-06-20 NOTE — ED Notes (Signed)
Pt has been provided with discharge instructions. Pt denies any questions or concerns at this time. Pt verbalizes understanding for follow up care and d/c.  VSS.  Pt left department with all belongings.  

## 2020-06-20 NOTE — ED Triage Notes (Signed)
Pt brought in by EMS to lobby via w/c with no distress noted; reports seen 2 days ago for left arm numbness; reocurrence of symptoms today, hx CVA (left side affected)

## 2020-06-20 NOTE — ED Triage Notes (Signed)
Pt in via [facility] from home, reports numbness/tingling from left elbow with radiation down into hand x one day.  Denies hx of same.  Vitals WDL, NAD noted at this time.

## 2020-06-21 NOTE — ED Provider Notes (Signed)
[facility] Emergency Department Provider Note  ____________________________________________   None    (approximate)  I have reviewed the triage vital signs and the nursing notes.   HISTORY  Chief Complaint Numbness  HPI Hector [name] is a 55 y.o. male returns to the ED for evaluation and request for pain management.  Patient was evaluated yesterday for multiple complaints including his left upper extremity numbness and paresthesias.  He was evaluated with a head CT as well as MRI the brain did not reveal any acute findings.  Patient is likely experiencing radiculopathy or neuropathy symptoms of the right upper extremity based on his previous history.  He presented today with a request for pain management for his left upper extremity paresthesias.  Patient remarked that he was only treated with gout medicine as well as a fluid pill yesterday.  He reports he has been unable to follow-up with primary provider as he recently got discharged from a skilled nursing rehab facility.  Patient denies any interim injury in the less than 24 hours since his discharge from this department.  He denies any fever, chills, chest pain, or other injury.     Past Medical History:  Diagnosis Date  . Alcohol abuse   . 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 12/2007 MV: EF 28%, no isch;  b. 8/12 Echo: EF 25-35%; c. 02/2014 Echo: EF 20-25%; d. 12/2014 Cath: minimal CAD; e. 01/2015 Echo: EF 50-55%;  d. 05/2016 Echo: EF 30-35%, diff HK, gr2 DD; e. 11/2016 Echo: EF 45-50%, diff HK.  . Chronic combined systolic (congestive) and diastolic (congestive) heart failure ([place])    a. 05/2016 Most recent Echo: EF 30-35%, diff HK, Gr2 DD, mild MR, mod dil LA; b. 11/2016 Echo: EF 45-50%, diff HK.  . CKD (chronic kidney disease), stage III ([place])   . Elevated troponin    Chronically elevated. - level was 0.17-0.10 during recent admission  . Essential hypertension   . GI bleed 11/2013  .  Hyperlipidemia   . Pancreatitis   . Paroxysmal A-fib ([place])    a. new onset s/p unsuccessful TEE/DCCV on 08/16/2014; b. on amio/eliquis (CHA2DS2VASc = 2-3); c. reports 1-2 hrs of afib ~ q68mo.  . Paroxysmal atrial flutter ([place]    a. new onset 07/2014; b. s/p unsuccessful TEE/DCCV 08/16/2014; c. on apixaban.  . Sleep-disordered breathing    Has yet to have a sleep study  . Stroke ([facility]     Patient Active Problem List   Diagnosis Date Noted  . Elbow effusion 02/04/2020  . Left elbow pain 02/03/2020  . Hyperthyroidism without thyroid nodule 02/03/2020  . UTI (urinary tract infection) 01/30/2020  . Acute respiratory distress syndrome (ARDS) due to COVID-19 virus (HN[place] 12/31/2019  . Acute renal failure superimposed on stage 3b chronic kidney disease ([facility] 12/31/2019  . Hyperkalemia 12/31/2019  . Hyponatremia 12/31/2019  . Morbidly obese ([place] 12/29/2019  . Pneumonia due to COVID-19 virus 12/22/2019    Class: Acute  . Acute respiratory failure with hypoxemia ([place] 12/22/2019    Class: Acute  . Severe sepsis ([facility] 12/22/2019    Class: Acute  . COVID-19 12/22/2019  . Acute on chronic systolic CHF (congestive heart failure) ([place] 07/21/2016  . Obstructive sleep apnea 04/21/2016  . Nonischemic cardiomyopathy ([facility] 05/26/2015  . Chronic diastolic heart failure ([place] 04/01/2015  . Essential hypertension 04/01/2015  . Sinus bradycardia 01/29/2015  . Paroxysmal atrial fibrillation ([place] 08/16/2014  . Paroxysmal atrial flutter ([place] 08/14/2014    Class:  Acute  . Chronic kidney disease (CKD), stage III (moderate) ([place]) 07/02/2014  . Hypertensive heart disease   . Hyperlipidemia     Past Surgical History:  Procedure Laterality Date  . CARDIAC CATHETERIZATION N/A 01/09/2015   Procedure: Left Heart Cath and Coronary Angiography;  Surgeon: [name], MD;  Location: [place] CV LAB;  Service: Cardiovascular;  Laterality: N/A;  . ELECTROPHYSIOLOGIC STUDY N/A 08/16/2014   Procedure:  CARDIOVERSION;  Surgeon: [name], MD;  Location: [facility] ORS;  Service: Cardiovascular;  Laterality: N/A;  . FLEXIBLE SIGMOIDOSCOPY N/A 10/10/2015   Procedure: FLEXIBLE SIGMOIDOSCOPY;  Surgeon: [name], MD;  Location: [facility] ENDOSCOPY;  Service: Endoscopy;  Laterality: N/A;  . KNEE SURGERY Right   . NM MYOVIEW LTD  November 2011   No ischemia or infarction. EF 50-55% ( no improvement from 2009 Myoview EF of 28%  . TEE WITHOUT CARDIOVERSION N/A 08/16/2014   Procedure: TRANSESOPHAGEAL ECHOCARDIOGRAM (TEE);  Surgeon: [name], MD;  Location: [facility] ORS;  Service: Cardiovascular;  Laterality: N/A;    Prior to Admission medications   Medication Sig Start Date End Date Taking? Authorizing Provider  gabapentin (NEURONTIN) 100 MG capsule Take 2 capsules (200 mg total) by mouth 3 (three) times daily. 06/20/20 08/19/20 Yes Korryn Pancoast, Dannielle Karvonen, PA-C  allopurinol (ZYLOPRIM) 100 MG tablet Take 100 mg by mouth daily as needed.    [provider]  amiodarone (PACERONE) 200 MG tablet Take 1 tablet (200 mg total) by mouth daily. 01/03/18   Alisa Graff, FNP  apixaban (ELIQUIS) 5 MG TABS tablet Take 1 tablet (5 mg total) by mouth 2 (two) times daily. 12/01/18   Alisa Graff, FNP  atorvastatin (LIPITOR) 80 MG tablet Take 1 tablet (80 mg total) by mouth daily. 03/15/18   Alisa Graff, FNP  carvedilol (COREG) 6.25 MG tablet Take 1 tablet (6.25 mg total) by mouth 2 (two) times daily with a meal. 02/07/20   Pokhrel, Laxman, MD  docusate sodium (COLACE) 100 MG capsule Take 100 mg by mouth daily.    [provider]  famotidine (PEPCID) 20 MG tablet Take 20 mg by mouth daily.    [provider]  ferrous gluconate (FERGON) 324 MG tablet Take 324 mg by mouth daily with breakfast.    [provider]  furosemide (LASIX) 40 MG tablet Take 1 tablet (40 mg total) by mouth daily for 15 days. 06/16/20 07/01/20  Carrie Mew, MD  furosemide (LASIX) 40 MG tablet Take  1 tablet (40 mg total) by mouth 2 (two) times daily for 15 days. 06/18/20 07/03/20  Naaman Plummer, MD  gabapentin (NEURONTIN) 100 MG capsule Take 1 capsule (100 mg total) by mouth 3 (three) times daily. 02/07/20   Pokhrel, Corrie Mckusick, MD  hydrALAZINE (APRESOLINE) 25 MG tablet Take 1 tablet (25 mg total) by mouth 4 (four) times daily. 06/18/20 07/18/20  Naaman Plummer, MD  levothyroxine (SYNTHROID) 25 MCG tablet Take 25 mcg by mouth daily before breakfast.    [provider]  Menthol, Topical Analgesic, (BIOFREEZE ROLL-ON COLORLESS) 4 % GEL Apply 1 application topically daily.    [provider]  Nutritional Supplements (FEEDING SUPPLEMENT, NEPRO CARB STEADY,) LIQD Take 237 mLs by mouth 3 (three) times daily between meals. 01/19/20   Enzo Bi, MD  ondansetron (ZOFRAN) 4 MG tablet Take 4 mg by mouth every 8 (eight) hours as needed for nausea or vomiting.    [provider]  Pollen Extracts (PROSTAT PO) Take  50 mLs by mouth daily.    [provider]  vitamin B-12 (CYANOCOBALAMIN) 500 MCG tablet Take 500 mcg by mouth daily.    [provider]    Allergies Esomeprazole magnesium and Eggs or egg-derived products  Family History  Problem Relation Age of Onset  . Hypertension Mother   . Hyperlipidemia Mother   . Diabetes Mother     Social History Social History   Tobacco Use  . Smoking status: Former Smoker    Packs/day: 1.00    Years: 12.00    Pack years: 12.00    Quit date: 02/23/1998    Years since quitting: 22.3  . Smokeless tobacco: Never Used  Vaping Use  . Vaping Use: Never used  Substance Use Topics  . Alcohol use: Not Currently    Alcohol/week: 0.0 standard drinks    Comment: Past heavy drinker  . Drug use: No    Review of Systems  Constitutional: No fever/chills Eyes: No visual changes. ENT: No sore throat. Cardiovascular: Denies chest pain. Respiratory: Denies shortness of breath. Gastrointestinal: No abdominal pain.  No  nausea, no vomiting.  No diarrhea.  No constipation. Genitourinary: Negative for dysuria. Musculoskeletal: Negative for back pain. Skin: Negative for rash. Neurological: Negative for headaches, focal weakness. Reports  LUE numbness. ____________________________________________   PHYSICAL EXAM:  VITAL SIGNS: ED Triage Vitals  Enc Vitals Group     BP 06/20/20 1958 (!) 138/97     Pulse Rate 06/20/20 1958 81     Resp 06/20/20 2006 20     Temp 06/20/20 1958 97.7 F (36.5 C)     Temp Source 06/20/20 1958 Oral     SpO2 06/20/20 1944 95 %     Weight 06/20/20 1958 256 lb (116.1 kg)     Height 06/20/20 1958 '5\' 11"'$  (1.803 m)     Head Circumference --      Peak Flow --      Pain Score 06/20/20 2006 0     Pain Loc --      Pain Edu? --      Excl. in [place]? --     Constitutional: Alert and oriented. Well appearing and in no acute distress. Eyes: Conjunctivae are normal. PERRL. EOMI. Head: Atraumatic. Nose: No congestion/rhinnorhea. Mouth/Throat: Mucous membranes are moist.  Oropharynx non-erythematous. Neck: No stridor.  No cervical spine tenderness to palpation. Cardiovascular: Normal rate, regular rhythm. Grossly normal heart sounds.  Good peripheral circulation. Respiratory: Normal respiratory effort.  No retractions. Lungs CTAB. Gastrointestinal: Soft and nontender. No distention. No abdominal bruits. No CVA tenderness. Musculoskeletal: Left hand with no obvious deformity or effusion.  Normal composite fist appreciated.  No lower extremity tenderness nor edema.  No joint effusions. Neurologic: Cranial nerves II to XII grossly intact.  Normal interesting opposition testing the left hand.  Normal gross sensation appreciated.  Normal speech and language. No gross focal neurologic deficits are appreciated. No gait instability. Skin:  Skin is warm, dry and intact. No rash noted. Psychiatric: Mood and affect are normal. Speech and behavior are  normal.  ____________________________________________   LABS (all labs ordered are listed, but only abnormal results are displayed)  Labs Reviewed - No data to display ____________________________________________  EKG  ____________________________________________  RADIOLOGY I, Melvenia Needles, personally viewed and evaluated these images (plain radiographs) as part of my medical decision making, as well as reviewing the written report by the radiologist.  ED MD interpretation:    Official radiology report(s): No results found.  ____________________________________________  PROCEDURES  Procedure(s) performed (including Critical Care):  Procedures   ____________________________________________   INITIAL IMPRESSION / ASSESSMENT AND PLAN / ED COURSE  As part of my medical decision making, I reviewed the following data within the [place] chart reviewed, Notes from prior ED visits and [place] Controlled Substance Database     Patient ED evaluation and request for pain medicine for his chronic left upper extremity paresthesias.  Patient was discharged with a prescription for gabapentin to take at 200 mg 3 times daily.  He should follow with primary provider for reevaluation ongoing management of his paresthesias.  No red flags on exam at this time.  No indication for any repeat imaging or labs at the patient was discharged less than 24 hours prior.   __________________________________________   FINAL CLINICAL IMPRESSION(S) / ED DIAGNOSES  Final diagnoses:  Numbness     ED Discharge Orders         Ordered    gabapentin (NEURONTIN) 100 MG capsule  3 times daily        06/20/20 2232          *Please note:  Hector [name] was evaluated in Emergency Department on 06/21/2020 for the symptoms described in the history of present illness. He was evaluated in the context of the global COVID-19 pandemic, which necessitated consideration that  the patient might be at risk for infection with the SARS-CoV-2 virus that causes COVID-19. Institutional protocols and algorithms that pertain to the evaluation of patients at risk for COVID-19 are in a state of rapid change based on information released by regulatory bodies including the CDC and federal and state organizations. These policies and algorithms were followed during the patient's care in the ED.  Some ED evaluations and interventions may be delayed as a result of limited staffing during and the pandemic.*   Note:  This document was prepared using Dragon voice recognition software and may include unintentional dictation errors.    Melvenia Needles, PA-C 06/21/20 0016    Harvest Dark, MD 06/21/20 2325

## 2020-06-23 NOTE — ED Provider Notes (Signed)
[facility] Emergency Department Provider Note   ____________________________________________   Event Date/Time   First MD Initiated Contact with Patient 06/18/20 1508     (approximate)  I have reviewed the triage vital signs and the nursing notes.   HISTORY  Chief Complaint Gout    HPI Hector [name] is a 55 y.o. male with below stated past medical history presents via private vehicle for the third time in 3 days with similar complaints of bilateral lower extremity edema and pain as well as bilateral upper extremity numbness.  Patient states that he has been taking diuretics over the past day and does not see any result.  Patient also states that the numbness in his bilateral upper extremities is worsening.  Patient currently denies any vision changes, tinnitus, difficulty speaking, facial droop, sore throat, chest pain, shortness of breath, abdominal pain, nausea/vomiting/diarrhea, dysuria         Past Medical History:  Diagnosis Date  . Alcohol abuse   . 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 12/2007 MV: EF 28%, no isch;  b. 8/12 Echo: EF 25-35%; c. 02/2014 Echo: EF 20-25%; d. 12/2014 Cath: minimal CAD; e. 01/2015 Echo: EF 50-55%;  d. 05/2016 Echo: EF 30-35%, diff HK, gr2 DD; e. 11/2016 Echo: EF 45-50%, diff HK.  . Chronic combined systolic (congestive) and diastolic (congestive) heart failure ([place])    a. 05/2016 Most recent Echo: EF 30-35%, diff HK, Gr2 DD, mild MR, mod dil LA; b. 11/2016 Echo: EF 45-50%, diff HK.  . CKD (chronic kidney disease), stage III ([place])   . Elevated troponin    Chronically elevated. - level was 0.17-0.10 during recent admission  . Essential hypertension   . GI bleed 11/2013  . Hyperlipidemia   . Pancreatitis   . Paroxysmal A-fib ([place])    a. new onset s/p unsuccessful TEE/DCCV on 08/16/2014; b. on amio/eliquis (CHA2DS2VASc = 2-3); c. reports 1-2 hrs of afib ~ q28mo.  . Paroxysmal atrial flutter ([place]    a. new onset  07/2014; b. s/p unsuccessful TEE/DCCV 08/16/2014; c. on apixaban.  . Sleep-disordered breathing    Has yet to have a sleep study  . Stroke ([facility]     Patient Active Problem List   Diagnosis Date Noted  . Elbow effusion 02/04/2020  . Left elbow pain 02/03/2020  . Hyperthyroidism without thyroid nodule 02/03/2020  . UTI (urinary tract infection) 01/30/2020  . Acute respiratory distress syndrome (ARDS) due to COVID-19 virus ([place] 12/31/2019  . Acute renal failure superimposed on stage 3b chronic kidney disease (HH[place] 12/31/2019  . Hyperkalemia 12/31/2019  . Hyponatremia 12/31/2019  . Morbidly obese (HSatart[place] 12/29/2019  . Pneumonia due to COVID-19 virus 12/22/2019    Class: Acute  . Acute respiratory failure with hypoxemia (HL[place] 12/22/2019    Class: Acute  . Severe sepsis ([place] 12/22/2019    Class: Acute  . COVID-19 12/22/2019  . Acute on chronic systolic CHF (congestive heart failure) ([place] 07/21/2016  . Obstructive sleep apnea 04/21/2016  . Nonischemic cardiomyopathy (HE[place] 05/26/2015  . Chronic diastolic heart failure (H[place] 04/01/2015  . Essential hypertension 04/01/2015  . Sinus bradycardia 01/29/2015  . Paroxysmal atrial fibrillation (HG[place] 08/16/2014  . Paroxysmal atrial flutter (HG[place] 08/14/2014    Class: Acute  . Chronic kidney disease (CKD), stage III (moderate) ([place] 07/02/2014  . Hypertensive heart disease   . Hyperlipidemia     Past Surgical History:  Procedure Laterality Date  . CARDIAC CATHETERIZATION N/A 01/09/2015   Procedure: Left Heart  Cath and Coronary Angiography;  Surgeon: [name], MD;  Location: [place] CV LAB;  Service: Cardiovascular;  Laterality: N/A;  . ELECTROPHYSIOLOGIC STUDY N/A 08/16/2014   Procedure: CARDIOVERSION;  Surgeon: [name], MD;  Location: [facility] ORS;  Service: Cardiovascular;  Laterality: N/A;  . FLEXIBLE SIGMOIDOSCOPY N/A 10/10/2015   Procedure: FLEXIBLE SIGMOIDOSCOPY;  Surgeon: [name], MD;  Location: [facility]  ENDOSCOPY;  Service: Endoscopy;  Laterality: N/A;  . KNEE SURGERY Right   . NM MYOVIEW LTD  November 2011   No ischemia or infarction. EF 50-55% ( no improvement from 2009 Myoview EF of 28%  . TEE WITHOUT CARDIOVERSION N/A 08/16/2014   Procedure: TRANSESOPHAGEAL ECHOCARDIOGRAM (TEE);  Surgeon: [name], MD;  Location: [facility] ORS;  Service: Cardiovascular;  Laterality: N/A;    Prior to Admission medications   Medication Sig Start Date End Date Taking? Authorizing Provider  furosemide (LASIX) 40 MG tablet Take 1 tablet (40 mg total) by mouth 2 (two) times daily for 15 days. 06/18/20 07/03/20 Yes Naaman Plummer, MD  hydrALAZINE (APRESOLINE) 25 MG tablet Take 1 tablet (25 mg total) by mouth 4 (four) times daily. 06/18/20 07/18/20 Yes Naaman Plummer, MD  allopurinol (ZYLOPRIM) 100 MG tablet Take 100 mg by mouth daily as needed.    [provider]  amiodarone (PACERONE) 200 MG tablet Take 1 tablet (200 mg total) by mouth daily. 01/03/18   Alisa Graff, FNP  apixaban (ELIQUIS) 5 MG TABS tablet Take 1 tablet (5 mg total) by mouth 2 (two) times daily. 12/01/18   Alisa Graff, FNP  atorvastatin (LIPITOR) 80 MG tablet Take 1 tablet (80 mg total) by mouth daily. 03/15/18   Alisa Graff, FNP  carvedilol (COREG) 6.25 MG tablet Take 1 tablet (6.25 mg total) by mouth 2 (two) times daily with a meal. 02/07/20   Pokhrel, Laxman, MD  docusate sodium (COLACE) 100 MG capsule Take 100 mg by mouth daily.    [provider]  famotidine (PEPCID) 20 MG tablet Take 20 mg by mouth daily.    [provider]  ferrous gluconate (FERGON) 324 MG tablet Take 324 mg by mouth daily with breakfast.    [provider]  furosemide (LASIX) 40 MG tablet Take 1 tablet (40 mg total) by mouth daily for 15 days. 06/16/20 07/01/20  Carrie Mew, MD  gabapentin (NEURONTIN) 100 MG capsule Take 1 capsule (100 mg total) by mouth 3 (three) times daily. 02/07/20   Pokhrel, Corrie Mckusick, MD  gabapentin  (NEURONTIN) 100 MG capsule Take 2 capsules (200 mg total) by mouth 3 (three) times daily. 06/20/20 08/19/20  Menshew, Dannielle Karvonen, PA-C  levothyroxine (SYNTHROID) 25 MCG tablet Take 25 mcg by mouth daily before breakfast.    [provider]  Menthol, Topical Analgesic, (BIOFREEZE ROLL-ON COLORLESS) 4 % GEL Apply 1 application topically daily.    [provider]  Nutritional Supplements (FEEDING SUPPLEMENT, NEPRO CARB STEADY,) LIQD Take 237 mLs by mouth 3 (three) times daily between meals. 01/19/20   Enzo Bi, MD  ondansetron (ZOFRAN) 4 MG tablet Take 4 mg by mouth every 8 (eight) hours as needed for nausea or vomiting.    [provider]  Pollen Extracts (PROSTAT PO) Take 50 mLs by mouth daily.    [provider]  vitamin B-12 (CYANOCOBALAMIN) 500 MCG tablet Take 500 mcg by mouth daily.    [provider]    Allergies Esomeprazole magnesium and Eggs or egg-derived products  Family  History  Problem Relation Age of Onset  . Hypertension Mother   . Hyperlipidemia Mother   . Diabetes Mother     Social History Social History   Tobacco Use  . Smoking status: Former Smoker    Packs/day: 1.00    Years: 12.00    Pack years: 12.00    Quit date: 02/23/1998    Years since quitting: 22.3  . Smokeless tobacco: Never Used  Vaping Use  . Vaping Use: Never used  Substance Use Topics  . Alcohol use: Not Currently    Alcohol/week: 0.0 standard drinks    Comment: Past heavy drinker  . Drug use: No    Review of Systems Constitutional: No fever/chills Eyes: No visual changes. ENT: No sore throat. Cardiovascular: Denies chest pain. Respiratory: Denies shortness of breath. Gastrointestinal: No abdominal pain.  No nausea, no vomiting.  No diarrhea. Genitourinary: Negative for dysuria. Musculoskeletal: Negative for acute arthralgias Skin: Negative for rash.  Bilateral lower extremity edema Neurological: Negative for headaches, endorses numbness to  bilateral upper extremities Psychiatric: Negative for suicidal ideation/homicidal ideation   ____________________________________________   PHYSICAL EXAM:  VITAL SIGNS: ED Triage Vitals  Enc Vitals Group     BP 06/18/20 1453 (!) 163/129     Pulse Rate 06/18/20 1453 88     Resp 06/18/20 1453 18     Temp 06/18/20 1453 98.6 F (37 C)     Temp Source 06/18/20 1615 Oral     SpO2 06/18/20 1615 97 %     Weight 06/18/20 1449 260 lb (117.9 kg)     Height 06/18/20 1449 '5\' 11"'$  (1.803 m)     Head Circumference --      Peak Flow --      Pain Score 06/18/20 1449 8     Pain Loc --      Pain Edu? --      Excl. in [place]? --    Constitutional: Alert and oriented. Well appearing and in no acute distress. Eyes: Conjunctivae are normal. PERRL. Head: Atraumatic. Nose: No congestion/rhinnorhea. Mouth/Throat: Mucous membranes are moist. Neck: No stridor Cardiovascular: Grossly normal heart sounds.  Good peripheral circulation. Respiratory: Normal respiratory effort.  No retractions. Gastrointestinal: Soft and nontender. No distention. Musculoskeletal: No obvious deformities Neurologic:  Normal speech and language.  Subjective decrease sensation to bilateral upper extremities to the elbow Skin:  Skin is warm and dry.  Bilateral lower extremity edema to the knee Psychiatric: Mood and affect are normal. Speech and behavior are normal.  ____________________________________________   LABS (all labs ordered are listed, but only abnormal results are displayed)  Labs Reviewed - No data to display   PROCEDURES  Procedure(s) performed (including Critical Care):  Procedures   ____________________________________________   INITIAL IMPRESSION / ASSESSMENT AND PLAN / ED COURSE  As part of my medical decision making, I reviewed the following data within the [place] notes reviewed and incorporated, Labs reviewed, EKG interpreted, Old chart reviewed, Radiograph reviewed  and Notes from prior ED visits reviewed and incorporated        Patient is a 55 year old male that presents for bilateral upper and lower extremity pain with lower extremity edema in the setting of chronic heart failure and alcohol abuse.  Patient has had negative work-ups for the past 2 days including a CT and MRI of the head regarding this numbness with no acute abnormalities. Based on history, exam and findings, presentation most consistent with acute on chronic heart failure. Low suspicion for  PNA, ACS, tamponade, aortic dissection. Patient encouraged to continue his diuretic and follow-up with his primary care physician for further evaluation and management. 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   Naaman Plummer, MD 06/23/20 1538

## 2020-07-04 NOTE — Progress Notes (Deleted)
Date:  07/04/2020   ID:  Hector [name], DOB [birth year], MRN [id number]  Patient Location:  [address] [place] [address]   Provider location:   [facility], [place] office  PCP:  [name], MD  Cardiologist:  [facility]   No chief complaint on file.    History of Present Illness:    Hector [name] is a 55 y.o. male  past medical history of alcohol abuse nonischemic cardiomyopathy, possibly alcohol-related, cardiac cath 2016 nonobstructive CAD,  chronic combined heart failure,  hypertension,  obesity,  Paroxysmal atrial fibrillation.  Flutter Possible sleep apnea Ejection fraction in the 20s% dating back to 2009, confirmed by echo 2012, ejection fraction 45 to 50% in October 2018  Recent events reviewed In the hospital November 2021 Covid 19 pneumonia, respiratory failure, ARDS Given remdesivir, steroids,baricitinib Nephrology follow-up for renal failure Delene Loll was held secondary to renal failure Is on Lasix as needed  Reports that he lost 8 pounds, became very weak  Back in the hospital 1 month later December 2021 Acute renal failure superimposed on stage 3b chronic kidney disease ([place]) UTI, acute on chronic diastolic CHF, and poorly controlled hypertension.Initially hyperkalemia with elevated BUN and creatinine levels Creatinine of 2.9 on admission given Lokelma  required IV Lasix Gout, seen by orthopedics, aspiration of joint attempted  Recent echocardiogram December 2021 ejection fraction 45 to 50%  Recent lab work reviewed Potassium  4,.1 2.31  CR BUN 32 HGB 9.7, down from 11.8  EKG Feb 05 2020: NSR  Reports that he is followed by [facility] Discussed recent weight loss with him, reports most of the weight loss was when he had COVID, less of an appetite  On his visit today hypotensive both arms 90 systolic Verified by nursing and by myself bilaterally Denies significant  orthostasis symptoms  Medication list from rehab not available, appears he is on hydralazine, amlodipine, carvedilol We did call the rehab and had them fax the medications over, fax was poor copy, unable to read names of medications  EKG personally reviewed by myself on todays visit Show normal sinus rhythm rate 68 bpm consider old anterior MI/interventricular conduction delay  Past medical history reviewed In 2016 he was diagnosed with atrial flutter and was cardioverted in June 2016.  reverted to atrial fibrillation and was placed on amiodarone and Eliquis   Seen by electrophysiology in 2016 in the setting of LV dysfunction and  catheterization at that time showed nonobstructive CAD.     echo in April 2018 showed recurrent LV dysfunction with an EF of 30-35% diffuse hypokinesis in the setting of some noncompliance with medications.    Prior cardiac catheterization November 2016 Nonobstructive disease  Past Medical History:  Diagnosis Date  . Alcohol abuse   . 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 12/2007 MV: EF 28%, no isch;  b. 8/12 Echo: EF 25-35%; c. 02/2014 Echo: EF 20-25%; d. 12/2014 Cath: minimal CAD; e. 01/2015 Echo: EF 50-55%;  d. 05/2016 Echo: EF 30-35%, diff HK, gr2 DD; e. 11/2016 Echo: EF 45-50%, diff HK.  . Chronic combined systolic (congestive) and diastolic (congestive) heart failure ([place])    a. 05/2016 Most recent Echo: EF 30-35%, diff HK, Gr2 DD, mild MR, mod dil LA; b. 11/2016 Echo: EF 45-50%, diff HK.  . CKD (chronic kidney disease), stage III ([place])   . Elevated troponin    Chronically elevated. - level was 0.17-0.10 during recent admission  .  Essential hypertension   . GI bleed 11/2013  . Hyperlipidemia   . Pancreatitis   . Paroxysmal A-fib ([place])    a. new onset s/p unsuccessful TEE/DCCV on 08/16/2014; b. on amio/eliquis (CHA2DS2VASc = 2-3); c. reports 1-2 hrs of afib ~ q77mo.  . Paroxysmal atrial flutter ([place]    a. new onset 07/2014; b. s/p unsuccessful  TEE/DCCV 08/16/2014; c. on apixaban.  . Sleep-disordered breathing    Has yet to have a sleep study  . Stroke ([facility]    Past Surgical History:  Procedure Laterality Date  . CARDIAC CATHETERIZATION N/A 01/09/2015   Procedure: Left Heart Cath and Coronary Angiography;  Surgeon: DLeonie Man MD;  [facility];  Service: Cardiovascular;  Laterality: N/A;  . ELECTROPHYSIOLOGIC STUDY N/A 08/16/2014   Procedure: CARDIOVERSION;  Surgeon: TMinna Merritts MD;  Location: [facility];  Service: Cardiovascular;  Laterality: N/A;  . FLEXIBLE SIGMOIDOSCOPY N/A 10/10/2015   Procedure: FLEXIBLE SIGMOIDOSCOPY;  Surgeon: MLollie Sails MD;  [facility];  Service: Endoscopy;  Laterality: N/A;  . KNEE SURGERY Right   . NM MYOVIEW LTD  November 2011   No ischemia or infarction. EF 50-55% ( no improvement from 2009 Myoview EF of 28%  . TEE WITHOUT CARDIOVERSION N/A 08/16/2014   Procedure: TRANSESOPHAGEAL ECHOCARDIOGRAM (TEE);  Surgeon: TMinna Merritts MD;  Location: [facility] ORS;  Service: Cardiovascular;  Laterality: N/A;     No outpatient medications have been marked as taking for the 07/05/20 encounter (Appointment) with GMinna Merritts MD.     Allergies:   Esomeprazole magnesium and Eggs or egg-derived products   Social History   Tobacco Use  . Smoking status: Former Smoker    Packs/day: 1.00    Years: 12.00    Pack years: 12.00    Quit date: 02/23/1998    Years since quitting: 22.3  . Smokeless tobacco: Never Used  Vaping Use  . Vaping Use: Never used  Substance Use Topics  . Alcohol use: Not Currently    Alcohol/week: 0.0 standard drinks    Comment: Past heavy drinker  . Drug use: No     Current Outpatient Medications on File Prior to Visit  Medication Sig Dispense Refill  . allopurinol (ZYLOPRIM) 100 MG tablet Take 100 mg by mouth daily as needed.    .Marland Kitchenamiodarone (PACERONE) 200 MG tablet Take 1 tablet (200 mg total) by mouth daily. 90 tablet 3  .  apixaban (ELIQUIS) 5 MG TABS tablet Take 1 tablet (5 mg total) by mouth 2 (two) times daily. 60 tablet 5  . atorvastatin (LIPITOR) 80 MG tablet Take 1 tablet (80 mg total) by mouth daily. 30 tablet 5  . carvedilol (COREG) 6.25 MG tablet Take 1 tablet (6.25 mg total) by mouth 2 (two) times daily with a meal.    . docusate sodium (COLACE) 100 MG capsule Take 100 mg by mouth daily.    . famotidine (PEPCID) 20 MG tablet Take 20 mg by mouth daily.    . ferrous gluconate (FERGON) 324 MG tablet Take 324 mg by mouth daily with breakfast.    . furosemide (LASIX) 40 MG tablet Take 1 tablet (40 mg total) by mouth daily for 15 days. 15 tablet 0  . furosemide (LASIX) 40 MG tablet Take 1 tablet (40 mg total) by mouth 2 (two) times daily for 15 days. 30 tablet 0  . gabapentin (NEURONTIN) 100 MG capsule Take 1 capsule (100 mg total) by mouth 3 (three) times daily.    .Marland Kitchen  gabapentin (NEURONTIN) 100 MG capsule Take 2 capsules (200 mg total) by mouth 3 (three) times daily. 180 capsule 1  . hydrALAZINE (APRESOLINE) 25 MG tablet Take 1 tablet (25 mg total) by mouth 4 (four) times daily. 120 tablet 0  . levothyroxine (SYNTHROID) 25 MCG tablet Take 25 mcg by mouth daily before breakfast.    . Menthol, Topical Analgesic, (BIOFREEZE ROLL-ON COLORLESS) 4 % GEL Apply 1 application topically daily.    . Nutritional Supplements (FEEDING SUPPLEMENT, NEPRO CARB STEADY,) LIQD Take 237 mLs by mouth 3 (three) times daily between meals.  0  . ondansetron (ZOFRAN) 4 MG tablet Take 4 mg by mouth every 8 (eight) hours as needed for nausea or vomiting.    . Pollen Extracts (PROSTAT PO) Take 50 mLs by mouth daily.    . vitamin B-12 (CYANOCOBALAMIN) 500 MCG tablet Take 500 mcg by mouth daily.     No current facility-administered medications on file prior to visit.     Family Hx: The patient's family history includes Diabetes in his mother; Hyperlipidemia in his mother; Hypertension in his mother.  ROS:   Please see the history of  present illness.    Review of Systems  Constitutional: Negative.   Respiratory: Negative.   Cardiovascular: Negative.   Gastrointestinal: Negative.   Musculoskeletal: Negative.   Neurological: Negative.   Psychiatric/Behavioral: Negative.   All other systems reviewed and are negative.     Labs/Other Tests and Data Reviewed:    Recent Labs: 02/01/2020: TSH <0.010 06/19/2020: ALT 9; B Natriuretic Peptide 2,925.1; BUN 25; Creatinine, Ser 2.56; Hemoglobin 12.2; Magnesium 1.8; Platelets 334; Potassium 4.4; Sodium 138   Recent Lipid Panel Lab Results  Component Value Date/Time   CHOL 89 02/03/2020 06:02 AM   CHOL 249 (H) 10/11/2018 12:26 PM   CHOL 118 03/08/2014 04:10 AM   TRIG 125 02/03/2020 06:02 AM   TRIG 95 03/08/2014 04:10 AM   HDL 15 (L) 02/03/2020 06:02 AM   HDL 36 (L) 10/11/2018 12:26 PM   HDL 28 (L) 03/08/2014 04:10 AM   CHOLHDL 5.9 02/03/2020 06:02 AM   LDLCALC 49 02/03/2020 06:02 AM   LDLCALC 174 (H) 10/11/2018 12:26 PM   LDLCALC 71 03/08/2014 04:10 AM    Wt Readings from Last 3 Encounters:  06/20/20 256 lb (116.1 kg)  06/18/20 260 lb (117.9 kg)  06/16/20 260 lb (117.9 kg)     Exam:    There were no vitals taken for this visit. Blood pressure confirmed bilaterally by myself He reports weight 200 pounds, unable to stand today Constitutional:  oriented to person, place, and time. No distress.  In wheelchair HENT:  Head: Grossly normal Eyes:  no discharge. No scleral icterus.  Neck: No JVD, no carotid bruits  Cardiovascular: Regular rate and rhythm, no murmurs appreciated Pulmonary/Chest: Clear to auscultation bilaterally, no wheezes or rails Abdominal: Soft.  no distension.  no tenderness.  Musculoskeletal: Very weak legs, needing assistance to move his legs  onto the wheelchair leg support Neurological:  normal muscle tone. Coordination normal. No atrophy Skin: Skin warm and dry Psychiatric: normal affect, pleasant  ASSESSMENT & PLAN:    Chronic  systolic heart failure (HCC) Euvolemic,  Recent echocardiogram reviewed, mildly depressed ejection fraction  Paroxysmal atrial flutter (H[facility]) Maintaining normal sinus rhythm, continue carvedilol and amiodarone  Paroxysmal atrial fibrillation (H[facility]) As above no medication changes made  Hypotension Confirmed by myself, 90 systolic bilaterally, Likely secondary to traumatic weight loss over the past several months to 80 down  to 200 Recommended he hold hydralazine, hold amlodipine Monitor blood pressure at the facility  Nonischemic cardiomyopathy ([place]) Mildly depressed ejection fraction Stable from prior studies  Obstructive sleep apnea Recommend CPAP on a regular basis May improve with 80 pound weight loss  Mixed hyperlipidemia Repeat lipid panel at goal   Total encounter time more than 35 minutes  Greater than 50% was spent in counseling and coordination of care with the patient   Signed, Ida Rogue, MD  07/04/2020 6:07 PM    [place] Office [address], [place], [name] [address]

## 2020-07-05 ENCOUNTER — Ambulatory Visit: Payer: Medicaid Other | Admitting: Cardiovascular Disease

## 2020-07-05 DIAGNOSIS — I428 Other cardiomyopathies: Secondary | ICD-10-CM

## 2020-07-05 DIAGNOSIS — I10 Essential (primary) hypertension: Secondary | ICD-10-CM

## 2020-07-05 DIAGNOSIS — G4733 Obstructive sleep apnea (adult) (pediatric): Secondary | ICD-10-CM

## 2020-07-05 DIAGNOSIS — E782 Mixed hyperlipidemia: Secondary | ICD-10-CM

## 2020-07-05 DIAGNOSIS — I5032 Chronic diastolic (congestive) heart failure: Secondary | ICD-10-CM

## 2020-07-05 DIAGNOSIS — I480 Paroxysmal atrial fibrillation: Secondary | ICD-10-CM

## 2020-07-08 ENCOUNTER — Encounter: Payer: Self-pay | Admitting: Cardiovascular Disease

## 2020-07-10 ENCOUNTER — Telehealth: Payer: Self-pay | Admitting: Family

## 2020-07-10 ENCOUNTER — Ambulatory Visit: Payer: Medicaid Other | Admitting: Family

## 2020-07-10 NOTE — Telephone Encounter (Signed)
Patient did not show for his [facility] appointment on 07/10/20. Will attempt to reschedule.

## 2020-07-20 ENCOUNTER — Inpatient Hospital Stay
Admission: EM | Admit: 2020-07-20 | Discharge: 2020-07-25 | DRG: 378 | Disposition: A | Discharge status: Home / Self Care | Payer: Medicaid Other | Attending: Internal Medicine | Admitting: Internal Medicine

## 2020-07-20 DIAGNOSIS — D62 Acute posthemorrhagic anemia: Secondary | ICD-10-CM

## 2020-07-20 DIAGNOSIS — Z8673 Personal history of transient ischemic attack (TIA), and cerebral infarction without residual deficits: Secondary | ICD-10-CM

## 2020-07-20 DIAGNOSIS — I130 Hypertensive heart and chronic kidney disease with heart failure and stage 1 through stage 4 chronic kidney disease, or unspecified chronic kidney disease: Secondary | ICD-10-CM | POA: Diagnosis present

## 2020-07-20 DIAGNOSIS — N184 Chronic kidney disease, stage 4 (severe): Secondary | ICD-10-CM

## 2020-07-20 DIAGNOSIS — I2510 Atherosclerotic heart disease of native coronary artery without angina pectoris: Secondary | ICD-10-CM | POA: Diagnosis present

## 2020-07-20 DIAGNOSIS — E669 Obesity, unspecified: Secondary | ICD-10-CM | POA: Diagnosis present

## 2020-07-20 DIAGNOSIS — I480 Paroxysmal atrial fibrillation: Secondary | ICD-10-CM | POA: Diagnosis present

## 2020-07-20 DIAGNOSIS — I428 Other cardiomyopathies: Secondary | ICD-10-CM

## 2020-07-20 DIAGNOSIS — G4733 Obstructive sleep apnea (adult) (pediatric): Secondary | ICD-10-CM | POA: Diagnosis present

## 2020-07-20 DIAGNOSIS — Z83438 Family history of other disorder of lipoprotein metabolism and other lipidemia: Secondary | ICD-10-CM

## 2020-07-20 DIAGNOSIS — Z79899 Other long term (current) drug therapy: Secondary | ICD-10-CM

## 2020-07-20 DIAGNOSIS — Z7901 Long term (current) use of anticoagulants: Secondary | ICD-10-CM

## 2020-07-20 DIAGNOSIS — Z91012 Allergy to eggs: Secondary | ICD-10-CM

## 2020-07-20 DIAGNOSIS — Z20822 Contact with and (suspected) exposure to covid-19: Secondary | ICD-10-CM | POA: Diagnosis present

## 2020-07-20 DIAGNOSIS — Z8249 Family history of ischemic heart disease and other diseases of the circulatory system: Secondary | ICD-10-CM

## 2020-07-20 DIAGNOSIS — K921 Melena: Secondary | ICD-10-CM

## 2020-07-20 DIAGNOSIS — D649 Anemia, unspecified: Secondary | ICD-10-CM

## 2020-07-20 DIAGNOSIS — Z79890 Hormone replacement therapy (postmenopausal): Secondary | ICD-10-CM

## 2020-07-20 DIAGNOSIS — I5042 Chronic combined systolic (congestive) and diastolic (congestive) heart failure: Secondary | ICD-10-CM

## 2020-07-20 DIAGNOSIS — Z888 Allergy status to other drugs, medicaments and biological substances status: Secondary | ICD-10-CM

## 2020-07-20 DIAGNOSIS — Z6835 Body mass index (BMI) 35.0-35.9, adult: Secondary | ICD-10-CM

## 2020-07-20 DIAGNOSIS — Z87891 Personal history of nicotine dependence: Secondary | ICD-10-CM

## 2020-07-20 DIAGNOSIS — Z5329 Procedure and treatment not carried out because of patient's decision for other reasons: Secondary | ICD-10-CM | POA: Diagnosis not present

## 2020-07-20 DIAGNOSIS — Z8616 Personal history of COVID-19: Secondary | ICD-10-CM

## 2020-07-20 DIAGNOSIS — E785 Hyperlipidemia, unspecified: Secondary | ICD-10-CM | POA: Diagnosis present

## 2020-07-20 DIAGNOSIS — K922 Gastrointestinal hemorrhage, unspecified: Secondary | ICD-10-CM | POA: Diagnosis present

## 2020-07-20 DIAGNOSIS — K5731 Diverticulosis of large intestine without perforation or abscess with bleeding: Principal | ICD-10-CM | POA: Diagnosis present

## 2020-07-20 MED ORDER — SODIUM CHLORIDE 0.9 % IV SOLN: INTRAVENOUS | Status: DC

## 2020-07-20 NOTE — ED Provider Notes (Signed)
[facility] Emergency Department Provider Note  ____________________________________________   Event Date/Time   First MD Initiated Contact with Patient 07/20/20 2342     (approximate)  I have reviewed the triage vital signs and the nursing notes.   HISTORY  Chief Complaint GI Bleeding    HPI Hector [name] is a 55 y.o. male with extensive medical issues as listed below and a prior history of alcohol abuse but he says he has been sober for years.  He has had prior episodes of GI bleeding as well but he said is very different than tonight.  The patient presents by EMS for evaluation of gross blood per rectum which she describes as red with some dark parts, but no black or tarry stool.  He said that he was having a little bit of cramping and felt he needed to have a bowel movement tonight around 6 PM.  He went to the bathroom and instead of stool coming out he said it was "all blood".  This is happened 2 or 3 additional times.  After the last time he was feeling lightheaded and dizzy and a little bit weak.  He does not have sharp abdominal pain, just some abdominal cramping.  He said that he had 1 episode of vomiting the last time he had a bowel movement but he feels fine now.  He denies any recent fever/chills, sore throat, chest pain, shortness of breath.  No dysuria.  He said he has been taking his medications.  He said that he is not on any anticoagulation; he used to be, but he was taken off of it at some point in the past.  He reports that he has had a prior stroke as well as a history of heart failure and other issues as listed below.         Past Medical History:  Diagnosis Date  . Alcohol abuse   . 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 12/2007 MV: EF 28%, no isch;  b. 8/12 Echo: EF 25-35%; c. 02/2014 Echo: EF 20-25%; d. 12/2014 Cath: minimal CAD; e. 01/2015 Echo: EF 50-55%;  d. 05/2016 Echo: EF 30-35%, diff HK, gr2 DD; e. 11/2016 Echo: EF 45-50%,  diff HK.  . Chronic combined systolic (congestive) and diastolic (congestive) heart failure ([place])    a. 05/2016 Most recent Echo: EF 30-35%, diff HK, Gr2 DD, mild MR, mod dil LA; b. 11/2016 Echo: EF 45-50%, diff HK.  . CKD (chronic kidney disease), stage III ([place])   . Elevated troponin    Chronically elevated. - level was 0.17-0.10 during recent admission  . Essential hypertension   . GI bleed 11/2013  . Hyperlipidemia   . Pancreatitis   . Paroxysmal A-fib ([place])    a. new onset s/p unsuccessful TEE/DCCV on 08/16/2014; b. on amio/eliquis (CHA2DS2VASc = 2-3); c. reports 1-2 hrs of afib ~ q36mo.  . Paroxysmal atrial flutter ([place]    a. new onset 07/2014; b. s/p unsuccessful TEE/DCCV 08/16/2014; c. on apixaban.  . Sleep-disordered breathing    Has yet to have a sleep study  . Stroke ([facility]     Patient Active Problem List   Diagnosis Date Noted  . CKD (chronic kidney disease) stage 4, GFR 15-29 ml/min ([place] 07/21/2020  . GIB (gastrointestinal bleeding) 07/21/2020  . Acute blood loss anemia 07/21/2020  . Hyperthyroidism without thyroid nodule 02/03/2020  . Acute respiratory distress syndrome (ARDS) due to COVID-19 virus ([place] 12/31/2019  . Acute renal failure superimposed  on stage 3b chronic kidney disease ([place]) 12/31/2019  . Hyperkalemia 12/31/2019  . Hyponatremia 12/31/2019  . Obstructive sleep apnea 04/21/2016  . Nonischemic cardiomyopathy ([place]) 05/26/2015  . Chronic combined systolic (congestive) and diastolic (congestive) heart failure ([place]) 04/01/2015  . Essential hypertension 04/01/2015  . Sinus bradycardia 01/29/2015  . Paroxysmal atrial fibrillation ([place]) 08/16/2014  . Paroxysmal atrial flutter ([place]) 08/14/2014    Class: Acute  . Chronic kidney disease (CKD), stage III (moderate) ([place]) 07/02/2014  . Hypertensive heart disease   . Hyperlipidemia     Past Surgical History:  Procedure Laterality Date  . CARDIAC CATHETERIZATION N/A 01/09/2015   Procedure: Left Heart Cath and  Coronary Angiography;  Surgeon: [name], MD;  Location: [facility] CV LAB;  Service: Cardiovascular;  Laterality: N/A;  . ELECTROPHYSIOLOGIC STUDY N/A 08/16/2014   Procedure: CARDIOVERSION;  Surgeon: [name], MD;  Location: [facility] ORS;  Service: Cardiovascular;  Laterality: N/A;  . FLEXIBLE SIGMOIDOSCOPY N/A 10/10/2015   Procedure: FLEXIBLE SIGMOIDOSCOPY;  Surgeon: [name], MD;  Location: [facility] ENDOSCOPY;  Service: Endoscopy;  Laterality: N/A;  . KNEE SURGERY Right   . NM MYOVIEW LTD  November 2011   No ischemia or infarction. EF 50-55% ( no improvement from 2009 Myoview EF of 28%  . TEE WITHOUT CARDIOVERSION N/A 08/16/2014   Procedure: TRANSESOPHAGEAL ECHOCARDIOGRAM (TEE);  Surgeon: [name], MD;  Location: [facility] ORS;  Service: Cardiovascular;  Laterality: N/A;    Prior to Admission medications   Medication Sig Start Date End Date Taking? Authorizing Provider  allopurinol (ZYLOPRIM) 100 MG tablet Take 100 mg by mouth daily as needed.    [provider]  amiodarone (PACERONE) 200 MG tablet Take 1 tablet (200 mg total) by mouth daily. 01/03/18   Alisa Graff, FNP  apixaban (ELIQUIS) 5 MG TABS tablet Take 1 tablet (5 mg total) by mouth 2 (two) times daily. 12/01/18   Alisa Graff, FNP  atorvastatin (LIPITOR) 80 MG tablet Take 1 tablet (80 mg total) by mouth daily. 03/15/18   Alisa Graff, FNP  carvedilol (COREG) 6.25 MG tablet Take 1 tablet (6.25 mg total) by mouth 2 (two) times daily with a meal. 02/07/20   Pokhrel, [place], MD  docusate sodium (COLACE) 100 MG capsule Take 100 mg by mouth daily.    [provider]  famotidine (PEPCID) 20 MG tablet Take 20 mg by mouth daily.    [provider]  ferrous gluconate (FERGON) 324 MG tablet Take 324 mg by mouth daily with breakfast.    [provider]  furosemide (LASIX) 40 MG tablet Take 1 tablet (40 mg total) by mouth daily for 15 days. 06/16/20 07/01/20  Carrie Mew, MD   furosemide (LASIX) 40 MG tablet Take 1 tablet (40 mg total) by mouth 2 (two) times daily for 15 days. 06/18/20 07/03/20  Naaman Plummer, MD  gabapentin (NEURONTIN) 100 MG capsule Take 1 capsule (100 mg total) by mouth 3 (three) times daily. 02/07/20   Pokhrel, Corrie Mckusick, MD  gabapentin (NEURONTIN) 100 MG capsule Take 2 capsules (200 mg total) by mouth 3 (three) times daily. 06/20/20 08/19/20  Menshew, Dannielle Karvonen, PA-C  hydrALAZINE (APRESOLINE) 25 MG tablet Take 1 tablet (25 mg total) by mouth 4 (four) times daily. 06/18/20 07/18/20  Naaman Plummer, MD  levothyroxine (SYNTHROID) 25 MCG tablet Take 25 mcg by mouth daily before breakfast.    [provider]  Menthol, Topical Analgesic, (BIOFREEZE ROLL-ON COLORLESS) 4 % GEL Apply  1 application topically daily.    [provider]  Nutritional Supplements (FEEDING SUPPLEMENT, NEPRO CARB STEADY,) LIQD Take 237 mLs by mouth 3 (three) times daily between meals. 01/19/20   Enzo Bi, MD  ondansetron (ZOFRAN) 4 MG tablet Take 4 mg by mouth every 8 (eight) hours as needed for nausea or vomiting.    [provider]  Pollen Extracts (PROSTAT PO) Take 50 mLs by mouth daily.    [provider]  vitamin B-12 (CYANOCOBALAMIN) 500 MCG tablet Take 500 mcg by mouth daily.    [provider]    Allergies Esomeprazole magnesium and Eggs or egg-derived products  Family History  Problem Relation Age of Onset  . Hypertension Mother   . Hyperlipidemia Mother   . Diabetes Mother     Social History Social History   Tobacco Use  . Smoking status: Former Smoker    Packs/day: 1.00    Years: 12.00    Pack years: 12.00    Quit date: 02/23/1998    Years since quitting: 22.4  . Smokeless tobacco: Never Used  Vaping Use  . Vaping Use: Never used  Substance Use Topics  . Alcohol use: Not Currently    Alcohol/week: 0.0 standard drinks    Comment: Past heavy drinker  . Drug use: No    Review of Systems Constitutional: No  fever/chills Eyes: No visual changes. ENT: No sore throat. Cardiovascular: Denies chest pain. Respiratory: Denies shortness of breath. Gastrointestinal: Positive for bright red blood per rectum multiple times.  Occasional cramping abdominal pain.  1 episode of vomiting.  No persistent nausea. Genitourinary: Negative for dysuria. Musculoskeletal: Negative for neck pain.  Negative for back pain. Integumentary: Negative for rash. Neurological: Positive for some lightheadedness.  Negative for headaches, focal weakness or numbness.  ____________________________________________   PHYSICAL EXAM:  VITAL SIGNS: ED Triage Vitals  Enc Vitals Group     BP 07/20/20 2336 120/79     Pulse Rate 07/20/20 2336 68     Resp 07/20/20 2336 20     Temp 07/20/20 2344 97.7 F (36.5 C)     Temp Source 07/20/20 2344 Oral     SpO2 07/20/20 2332 100 %     Weight 07/20/20 2337 116 kg (255 lb 11.7 oz)     Height 07/20/20 2337 1.803 m ('5\' 11"'$ )     Head Circumference --      Peak Flow --      Pain Score 07/20/20 2337 4     Pain Loc --      Pain Edu? --      Excl. in [place]? --     Constitutional: Alert and oriented.  Eyes: Conjunctivae are normal.  Head: Atraumatic. Nose: No congestion/rhinnorhea. Mouth/Throat: Patient is wearing a mask. Neck: No stridor.  No meningeal signs.   Cardiovascular: Normal rate, regular rhythm. Good peripheral circulation. Respiratory: Normal respiratory effort.  No retractions. Gastrointestinal: Soft and nontender. No distention.  Rectal: Bright red blood per rectum (hematochezia) on digital rectal exam.  Strongly Hemoccult positive. Musculoskeletal: No lower extremity tenderness nor edema. No gross deformities of extremities. Neurologic:  Normal speech and language. No gross focal neurologic deficits are appreciated.  Skin:  Skin is warm, dry and intact. Psychiatric: Mood and affect are normal. Speech and behavior are normal.  ____________________________________________    LABS (all labs ordered are listed, but only abnormal results are displayed)  Labs Reviewed  COMPREHENSIVE METABOLIC PANEL - Abnormal; Notable for the following components:  Result Value   CO2 20 (*)    Glucose, Bld 114 (*)    BUN 44 (*)    Creatinine, Ser 2.77 (*)    Calcium 8.2 (*)    Total Protein 5.5 (*)    Albumin 2.9 (*)    AST 11 (*)    GFR, Estimated 26 (*)    All other components within normal limits  CBC - Abnormal; Notable for the following components:   WBC 12.3 (*)    RBC 2.86 (*)    Hemoglobin 7.9 (*)    HCT 25.8 (*)    RDW 15.7 (*)    All other components within normal limits  URINALYSIS, COMPLETE (UACMP) WITH MICROSCOPIC - Abnormal; Notable for the following components:   Color, Urine YELLOW (*)    APPearance CLEAR (*)    Protein, ur 100 (*)    Leukocytes,Ua TRACE (*)    All other components within normal limits  PROTIME-INR - Abnormal; Notable for the following components:   Prothrombin Time 16.5 (*)    INR 1.3 (*)    All other components within normal limits  RESP PANEL BY RT-PCR (FLU A&B, COVID) ARPGX2  LIPASE, BLOOD  TYPE AND SCREEN  TYPE AND SCREEN  PREPARE RBC (CROSSMATCH)   ____________________________________________  EKG  ED ECG REPORT I, Hinda Kehr, the attending physician, personally viewed and interpreted this ECG.  Date: 07/20/2020 EKG Time: 23: 43 Rate: 67 Rhythm: normal sinus rhythm QRS Axis: normal Intervals: normal ST/T Wave abnormalities: Non-specific ST segment / T-wave changes, but no clear evidence of acute ischemia. Narrative Interpretation: no definitive evidence of acute ischemia; does not meet STEMI criteria.   ____________________________________________  RADIOLOGY I, Hinda Kehr, personally viewed and evaluated these images (plain radiographs) as part of my medical decision making, as well as reviewing the written report by the radiologist.  ED MD interpretation: No indication for emergent imaging at this  time  Official radiology report(s): No results found.  ____________________________________________   PROCEDURES   Procedure(s) performed (including Critical Care):  .Critical Care Performed by: Hinda Kehr, MD Authorized by: Hinda Kehr, MD   Critical care provider statement:    Critical care time (minutes):  40   Critical care time was exclusive of:  Separately billable procedures and treating other patients   Critical care was necessary to treat or prevent imminent or life-threatening deterioration of the following conditions:  Circulatory failure   Critical care was time spent personally by me on the following activities:  Development of treatment plan with patient or surrogate, discussions with consultants, evaluation of patient's response to treatment, examination of patient, obtaining history from patient or surrogate, ordering and performing treatments and interventions, ordering and review of laboratory studies, ordering and review of radiographic studies, pulse oximetry, re-evaluation of patient's condition and review of old charts .1-3 Lead EKG Interpretation Performed by: Hinda Kehr, MD Authorized by: Hinda Kehr, MD     Interpretation: normal     ECG rate:  67   ECG rate assessment: normal     Rhythm: sinus rhythm     Ectopy: none     Conduction: normal       ____________________________________________   INITIAL IMPRESSION / MDM / ASSESSMENT AND PLAN / ED COURSE  As part of my medical decision making, I reviewed the following data within the [place] notes reviewed and incorporated, Labs reviewed , EKG interpreted , Old chart reviewed, Discussed with admitting physician  and Notes from prior ED  visits   Differential diagnosis includes, but is not limited to, diverticulosis, AV malformation, gastritis, neoplasm, internal hemorrhoids.  The patient is on the cardiac monitor to evaluate for evidence of arrhythmia and/or  significant heart rate changes.  Patient has what appears to be a large volume of hematochezia, acute in onset, but no significant pain or tenderness to palpation.  Particularly in the setting of a critical shortage of IV contrast, there is no role for CT scan at this time.  Labs are pending but I anticipate admission to the hospital for GI consult and careful monitoring and serial H&H.  Given the patient has had a stroke in the past so I will avoid medications such as TXA and less he becomes unstable.  Currently blood pressure is normal and vital signs are within normal limits even though reportedly he was slightly hypotensive in the field and received about 500 mL normal saline prior to arrival.  Patient understands and agrees with the plan.     Clinical Course as of 07/21/20 0122  Sun Jul 21, 2020  0122 Patient's hemoglobin has dropped about 5 points in about a month.  He is still greater than 7 but anticipate he is going to drop more given that he has had acute blood loss.  I consented him for 1 unit of blood and will give it over at least 2 hours given his history of CHF.  I discussed the case by phone with Dr. Damita Dunnings with the hospitalist service and she will admit. [CF]    Clinical Course User Index [CF] Hinda Kehr, MD     ____________________________________________  FINAL CLINICAL IMPRESSION(S) / ED DIAGNOSES  Final diagnoses:  Symptomatic anemia  Hematochezia     MEDICATIONS GIVEN DURING THIS VISIT:  Medications  0.9 %  sodium chloride infusion ( Intravenous New Bag/Given 07/21/20 0004)  0.9 %  sodium chloride infusion (has no administration in time range)     ED Discharge Orders    None      *Please note:  Hector [name] was evaluated in Emergency Department on 07/21/2020 for the symptoms described in the history of present illness. He was evaluated in the context of the global COVID-19 pandemic, which necessitated consideration that the patient might be at risk  for infection with the SARS-CoV-2 virus that causes COVID-19. Institutional protocols and algorithms that pertain to the evaluation of patients at risk for COVID-19 are in a state of rapid change based on information released by regulatory bodies including the CDC and federal and state organizations. These policies and algorithms were followed during the patient's care in the ED.  Some ED evaluations and interventions may be delayed as a result of limited staffing during and after the pandemic.*  Note:  This document was prepared using Dragon voice recognition software and may include unintentional dictation errors.   Hinda Kehr, MD 07/21/20 [phone]

## 2020-07-20 NOTE — ED Triage Notes (Addendum)
EMS brought in from home for dark red blood in stool around 6pm yesterday. Complaining of mid-abdominal pain, described as throbbing pain. Nausea. No blood thinner use. Hx GI bleed 3 years ago. Dizziness.   Per EMS initial SBP 80, given NS 500CC en route. AAOx4.

## 2020-07-21 ENCOUNTER — Inpatient Hospital Stay: Payer: Medicaid Other

## 2020-07-21 ENCOUNTER — Encounter: Payer: Self-pay | Admitting: Internal Medicine

## 2020-07-21 DIAGNOSIS — Z8249 Family history of ischemic heart disease and other diseases of the circulatory system: Secondary | ICD-10-CM | POA: Diagnosis not present

## 2020-07-21 DIAGNOSIS — N184 Chronic kidney disease, stage 4 (severe): Secondary | ICD-10-CM

## 2020-07-21 DIAGNOSIS — K921 Melena: Secondary | ICD-10-CM | POA: Diagnosis present

## 2020-07-21 DIAGNOSIS — Z79890 Hormone replacement therapy (postmenopausal): Secondary | ICD-10-CM | POA: Diagnosis not present

## 2020-07-21 DIAGNOSIS — I130 Hypertensive heart and chronic kidney disease with heart failure and stage 1 through stage 4 chronic kidney disease, or unspecified chronic kidney disease: Secondary | ICD-10-CM | POA: Diagnosis present

## 2020-07-21 DIAGNOSIS — Z87891 Personal history of nicotine dependence: Secondary | ICD-10-CM | POA: Diagnosis not present

## 2020-07-21 DIAGNOSIS — Z888 Allergy status to other drugs, medicaments and biological substances status: Secondary | ICD-10-CM | POA: Diagnosis not present

## 2020-07-21 DIAGNOSIS — Z8673 Personal history of transient ischemic attack (TIA), and cerebral infarction without residual deficits: Secondary | ICD-10-CM | POA: Diagnosis not present

## 2020-07-21 DIAGNOSIS — Z6835 Body mass index (BMI) 35.0-35.9, adult: Secondary | ICD-10-CM | POA: Diagnosis not present

## 2020-07-21 DIAGNOSIS — D62 Acute posthemorrhagic anemia: Secondary | ICD-10-CM | POA: Diagnosis present

## 2020-07-21 DIAGNOSIS — Z5329 Procedure and treatment not carried out because of patient's decision for other reasons: Secondary | ICD-10-CM | POA: Diagnosis not present

## 2020-07-21 DIAGNOSIS — Z7901 Long term (current) use of anticoagulants: Secondary | ICD-10-CM | POA: Diagnosis not present

## 2020-07-21 DIAGNOSIS — E669 Obesity, unspecified: Secondary | ICD-10-CM | POA: Diagnosis present

## 2020-07-21 DIAGNOSIS — I428 Other cardiomyopathies: Secondary | ICD-10-CM | POA: Diagnosis present

## 2020-07-21 DIAGNOSIS — I2510 Atherosclerotic heart disease of native coronary artery without angina pectoris: Secondary | ICD-10-CM | POA: Diagnosis present

## 2020-07-21 DIAGNOSIS — E785 Hyperlipidemia, unspecified: Secondary | ICD-10-CM | POA: Diagnosis present

## 2020-07-21 DIAGNOSIS — Z79899 Other long term (current) drug therapy: Secondary | ICD-10-CM | POA: Diagnosis not present

## 2020-07-21 DIAGNOSIS — Z83438 Family history of other disorder of lipoprotein metabolism and other lipidemia: Secondary | ICD-10-CM | POA: Diagnosis not present

## 2020-07-21 DIAGNOSIS — Z20822 Contact with and (suspected) exposure to covid-19: Secondary | ICD-10-CM | POA: Diagnosis present

## 2020-07-21 DIAGNOSIS — Z91012 Allergy to eggs: Secondary | ICD-10-CM | POA: Diagnosis not present

## 2020-07-21 DIAGNOSIS — Z8616 Personal history of COVID-19: Secondary | ICD-10-CM | POA: Diagnosis not present

## 2020-07-21 DIAGNOSIS — G4733 Obstructive sleep apnea (adult) (pediatric): Secondary | ICD-10-CM | POA: Diagnosis present

## 2020-07-21 DIAGNOSIS — K922 Gastrointestinal hemorrhage, unspecified: Secondary | ICD-10-CM | POA: Diagnosis present

## 2020-07-21 DIAGNOSIS — K5731 Diverticulosis of large intestine without perforation or abscess with bleeding: Secondary | ICD-10-CM | POA: Diagnosis present

## 2020-07-21 DIAGNOSIS — I480 Paroxysmal atrial fibrillation: Secondary | ICD-10-CM | POA: Diagnosis present

## 2020-07-21 DIAGNOSIS — I5042 Chronic combined systolic (congestive) and diastolic (congestive) heart failure: Secondary | ICD-10-CM | POA: Diagnosis present

## 2020-07-21 LAB — CBC
HCT: 22.4 % — ABNORMAL LOW (ref 39.0–52.0)
HCT: 25 % — ABNORMAL LOW (ref 39.0–52.0)
HCT: 25.8 % — ABNORMAL LOW (ref 39.0–52.0)
Hemoglobin: 7 g/dL — ABNORMAL LOW (ref 13.0–17.0)
Hemoglobin: 7.9 g/dL — ABNORMAL LOW (ref 13.0–17.0)
Hemoglobin: 8.2 g/dL — ABNORMAL LOW (ref 13.0–17.0)
MCH: 27.6 pg (ref 26.0–34.0)
MCH: 27.9 pg (ref 26.0–34.0)
MCH: 29.9 pg (ref 26.0–34.0)
MCHC: 30.6 g/dL (ref 30.0–36.0)
MCHC: 31.3 g/dL (ref 30.0–36.0)
MCHC: 32.8 g/dL (ref 30.0–36.0)
MCV: 89.2 fL (ref 80.0–100.0)
MCV: 90.2 fL (ref 80.0–100.0)
MCV: 91.2 fL (ref 80.0–100.0)
Platelets: 207 10*3/uL (ref 150–400)
Platelets: 208 10*3/uL (ref 150–400)
Platelets: 248 10*3/uL (ref 150–400)
RBC: 2.51 MIL/uL — ABNORMAL LOW (ref 4.22–5.81)
RBC: 2.74 MIL/uL — ABNORMAL LOW (ref 4.22–5.81)
RBC: 2.86 MIL/uL — ABNORMAL LOW (ref 4.22–5.81)
RDW: 15.5 % (ref 11.5–15.5)
RDW: 15.6 % — ABNORMAL HIGH (ref 11.5–15.5)
RDW: 15.7 % — ABNORMAL HIGH (ref 11.5–15.5)
WBC: 10.4 10*3/uL (ref 4.0–10.5)
WBC: 12.3 10*3/uL — ABNORMAL HIGH (ref 4.0–10.5)
WBC: 7.8 10*3/uL (ref 4.0–10.5)
nRBC: 0 % (ref 0.0–0.2)
nRBC: 0 % (ref 0.0–0.2)
nRBC: 0 % (ref 0.0–0.2)

## 2020-07-21 LAB — URINALYSIS, COMPLETE (UACMP) WITH MICROSCOPIC
Bacteria, UA: NONE SEEN
Bilirubin Urine: NEGATIVE
Glucose, UA: NEGATIVE mg/dL
Hgb urine dipstick: NEGATIVE
Ketones, ur: NEGATIVE mg/dL
Nitrite: NEGATIVE
Protein, ur: 100 mg/dL — AB
Specific Gravity, Urine: 1.016 (ref 1.005–1.030)
pH: 5 (ref 5.0–8.0)

## 2020-07-21 LAB — DIC (DISSEMINATED INTRAVASCULAR COAGULATION)PANEL
D-Dimer, Quant: 0.72 ug/mL-FEU — ABNORMAL HIGH (ref 0.00–0.50)
Fibrinogen: 206 mg/dL — ABNORMAL LOW (ref 210–475)
INR: 1.4 — ABNORMAL HIGH (ref 0.8–1.2)
Platelets: 210 10*3/uL (ref 150–400)
Prothrombin Time: 16.7 seconds — ABNORMAL HIGH (ref 11.4–15.2)
Smear Review: NONE SEEN
aPTT: 35 seconds (ref 24–36)

## 2020-07-21 LAB — COMPREHENSIVE METABOLIC PANEL WITH GFR
ALT: 5 U/L (ref 0–44)
AST: 11 U/L — ABNORMAL LOW (ref 15–41)
Albumin: 2.9 g/dL — ABNORMAL LOW (ref 3.5–5.0)
Alkaline Phosphatase: 45 U/L (ref 38–126)
Anion gap: 8 (ref 5–15)
BUN: 44 mg/dL — ABNORMAL HIGH (ref 6–20)
CO2: 20 mmol/L — ABNORMAL LOW (ref 22–32)
Calcium: 8.2 mg/dL — ABNORMAL LOW (ref 8.9–10.3)
Chloride: 111 mmol/L (ref 98–111)
Creatinine, Ser: 2.77 mg/dL — ABNORMAL HIGH (ref 0.61–1.24)
GFR, Estimated: 26 mL/min — ABNORMAL LOW
Glucose, Bld: 114 mg/dL — ABNORMAL HIGH (ref 70–99)
Potassium: 4.1 mmol/L (ref 3.5–5.1)
Sodium: 139 mmol/L (ref 135–145)
Total Bilirubin: 0.8 mg/dL (ref 0.3–1.2)
Total Protein: 5.5 g/dL — ABNORMAL LOW (ref 6.5–8.1)

## 2020-07-21 LAB — RESP PANEL BY RT-PCR (FLU A&B, COVID) ARPGX2
Influenza A by PCR: NEGATIVE
Influenza B by PCR: NEGATIVE
SARS Coronavirus 2 by RT PCR: NEGATIVE

## 2020-07-21 LAB — PROTIME-INR
INR: 1.3 — ABNORMAL HIGH (ref 0.8–1.2)
Prothrombin Time: 16.5 seconds — ABNORMAL HIGH (ref 11.4–15.2)

## 2020-07-21 LAB — PREPARE RBC (CROSSMATCH)

## 2020-07-21 LAB — LIPASE, BLOOD: Lipase: 28 U/L (ref 11–51)

## 2020-07-21 MED ORDER — HYDROCODONE-ACETAMINOPHEN 5-325 MG PO TABS
1.0000 | ORAL_TABLET | ORAL | Status: DC | PRN
  Administered 2020-07-21: 2 via ORAL
  Administered 2020-07-22 – 2020-07-24 (×4): 1 via ORAL
  Filled 2020-07-21: qty 1
  Filled 2020-07-21: qty 2
  Filled 2020-07-21 (×3): qty 1

## 2020-07-21 MED ORDER — SODIUM CHLORIDE 0.9% IV SOLUTION
Freq: Once | INTRAVENOUS | Status: AC
  Filled 2020-07-21: qty 250

## 2020-07-21 MED ORDER — MORPHINE SULFATE (PF) 2 MG/ML IV SOLN
2.0000 mg | INTRAVENOUS | Status: DC | PRN
  Administered 2020-07-21 – 2020-07-22 (×3): 2 mg via INTRAVENOUS
  Filled 2020-07-21 (×3): qty 1

## 2020-07-21 MED ORDER — SODIUM CHLORIDE 0.9% IV SOLUTION: Freq: Once | INTRAVENOUS | Status: DC

## 2020-07-21 MED ORDER — ONDANSETRON HCL 4 MG/2ML IJ SOLN
4.0000 mg | Freq: Four times a day (QID) | INTRAMUSCULAR | Status: DC | PRN
  Administered 2020-07-21 – 2020-07-22 (×2): 4 mg via INTRAVENOUS
  Filled 2020-07-21 (×2): qty 2

## 2020-07-21 MED ORDER — TECHNETIUM TC 99M-LABELED RED BLOOD CELLS IV KIT
20.0000 | PACK | Freq: Once | INTRAVENOUS | Status: AC | PRN
  Administered 2020-07-21: 19.14 via INTRAVENOUS

## 2020-07-21 MED ORDER — FAMOTIDINE IN NACL 20-0.9 MG/50ML-% IV SOLN
20.0000 mg | INTRAVENOUS | Status: DC
  Administered 2020-07-21 – 2020-07-25 (×5): 20 mg via INTRAVENOUS
  Filled 2020-07-21 (×5): qty 50

## 2020-07-21 MED ORDER — ACETAMINOPHEN 650 MG RE SUPP: 650.0000 mg | Freq: Four times a day (QID) | RECTAL | Status: DC | PRN

## 2020-07-21 MED ORDER — PEG 3350-KCL-NA BICARB-NACL 420 G PO SOLR
4000.0000 mL | Freq: Once | ORAL | Status: AC
  Administered 2020-07-21: 4000 mL via ORAL
  Filled 2020-07-21: qty 4000

## 2020-07-21 MED ORDER — ONDANSETRON HCL 4 MG PO TABS: 4.0000 mg | ORAL_TABLET | Freq: Four times a day (QID) | ORAL | Status: DC | PRN

## 2020-07-21 MED ORDER — ATORVASTATIN CALCIUM 80 MG PO TABS
80.0000 mg | ORAL_TABLET | Freq: Every day | ORAL | Status: DC
  Administered 2020-07-21 – 2020-07-24 (×4): 80 mg via ORAL
  Filled 2020-07-21 (×3): qty 1

## 2020-07-21 MED ORDER — ACETAMINOPHEN 325 MG PO TABS: 650.0000 mg | ORAL_TABLET | Freq: Four times a day (QID) | ORAL | Status: DC | PRN

## 2020-07-21 MED ORDER — SODIUM CHLORIDE 0.9 % IV SOLN: INTRAVENOUS | Status: DC

## 2020-07-21 MED ORDER — SODIUM CHLORIDE 0.9 % IV SOLN: 10.0000 mL/h | Freq: Once | INTRAVENOUS | Status: DC

## 2020-07-21 NOTE — H&P (Signed)
History and Physical    KHAMAR STANDARD [id number] DOB: 12/08/1965 DOA: 07/20/2020  PCP: [name], MD   Patient coming from: Home  I have personally briefly reviewed patient's old medical records in [place]  Chief Complaint: Rectal bleeding  HPI: Hector [name] is a 55 y.o. 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 fib not currently on anticoagulation, CKD 4,  GI bleed 2015, OSA, who was in his usual state of health until a few hours prior to arrival when he felt a sense of urgency and had a bowel movement that was dark blood.  He went on to have 2 additional episodes.  It was associated with abdominal cramping in the lower abdomen and one episode of nonbloody nonbilious vomiting..  After the last episode he felt lightheaded.  EMS reported SBP of 80 and administered 500 mL NS bolus in route. ED course: Upon arrival, afebrile, BP 119/73, pulse 68, pulse ox 100% on room air.  Hemoglobin 7.9, down from 12.2 a month prior, WBC 12,300, creatinine 2.77 which is at baseline, lipase 28.  Gross blood on rectal exam in the ER. EKG, personally viewed and interpreted: Sinus rhythm at 67 with no acute ST-T wave changes Imaging: None done  Patient was started on a unit of PRBC.  Hospitalist consulted for admission.  Acute blood loss anemia acute blood loss anemia  Review of Systems: As per HPI otherwise all other systems on review of systems negative.    Past Medical History:  Diagnosis Date  . Alcohol abuse   . 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 12/2007 MV: EF 28%, no isch;  b. 8/12 Echo: EF 25-35%; c. 02/2014 Echo: EF 20-25%; d. 12/2014 Cath: minimal CAD; e. 01/2015 Echo: EF 50-55%;  d. 05/2016 Echo: EF 30-35%, diff HK, gr2 DD; e. 11/2016 Echo: EF 45-50%, diff HK.  . Chronic  combined systolic (congestive) and diastolic (congestive) heart failure ([place])    a. 05/2016 Most recent Echo: EF 30-35%, diff HK, Gr2 DD, mild MR, mod dil LA; b. 11/2016 Echo: EF 45-50%, diff HK.  . CKD (chronic kidney disease), stage III ([place])   . Elevated troponin    Chronically elevated. - level was 0.17-0.10 during recent admission  . Essential hypertension   . GI bleed 11/2013  . Hyperlipidemia   . Pancreatitis   . Paroxysmal A-fib ([place])    a. new onset s/p unsuccessful TEE/DCCV on 08/16/2014; b. on amio/eliquis (CHA2DS2VASc = 2-3); c. reports 1-2 hrs of afib ~ q77mo.  . [place]ox[place] fl[place]    a. new onset 07/2014; b. s/p unsuccessful TEE/DCCV 08/16/2014; c. on apixaban.  . Sleep-disordered breathing    Has yet to have a sleep study  . Stroke ([facility]     Past Surgical History:  Procedure Laterality Date  . CARDIAC CATHETERIZATION N/A 01/09/2015   Procedure: Left Heart Cath and Coronary Angiography;  Surgeon: DLeonie Man MD;  [facility];  Service: Cardiovascular;  Laterality: N/A;  . ELECTROPHYSIOLOGIC STUDY N/A 08/16/2014   Procedure: CARDIOVERSION;  Surgeon: TMinna Merritts MD;  Location: [facility];  Service: Cardiovascular;  Laterality: N/A;  . FLEXIBLE SIGMOIDOSCOPY N/A 10/10/2015   Procedure: FLEXIBLE SIGMOIDOSCOPY;  Surgeon: MLollie Sails MD;  [facility];  Service: Endoscopy;  Laterality: N/A;  . KNEE SURGERY Right   .  NM MYOVIEW LTD  November 2011   No ischemia or infarction. EF 50-55% ( no improvement from 2009 Myoview EF of 28%  . TEE WITHOUT CARDIOVERSION N/A 08/16/2014   Procedure: TRANSESOPHAGEAL ECHOCARDIOGRAM (TEE);  Surgeon: [name], MD;  Location: [facility] ORS;  Service: Cardiovascular;  Laterality: N/A;     reports that he quit smoking about 22 years ago. He has a 12.00 pack-year smoking history. He has never used smokeless tobacco. He reports previous alcohol use. He reports that he does not use  drugs.  Allergies  Allergen Reactions  . Esomeprazole Magnesium Other (See Comments)    Suspected interstitial nephritis 2018  . Eggs Or Egg-Derived Products Rash    Family History  Problem Relation Age of Onset  . Hypertension Mother   . Hyperlipidemia Mother   . Diabetes Mother       Prior to Admission medications   Medication Sig Start Date End Date Taking? Authorizing Provider  allopurinol (ZYLOPRIM) 100 MG tablet Take 100 mg by mouth daily as needed.    [provider]  amiodarone (PACERONE) 200 MG tablet Take 1 tablet (200 mg total) by mouth daily. 01/03/18   Alisa Graff, FNP  apixaban (ELIQUIS) 5 MG TABS tablet Take 1 tablet (5 mg total) by mouth 2 (two) times daily. 12/01/18   Alisa Graff, FNP  atorvastatin (LIPITOR) 80 MG tablet Take 1 tablet (80 mg total) by mouth daily. 03/15/18   Alisa Graff, FNP  carvedilol (COREG) 6.25 MG tablet Take 1 tablet (6.25 mg total) by mouth 2 (two) times daily with a meal. 02/07/20   Pokhrel, Laxman, MD  docusate sodium (COLACE) 100 MG capsule Take 100 mg by mouth daily.    [provider]  famotidine (PEPCID) 20 MG tablet Take 20 mg by mouth daily.    [provider]  ferrous gluconate (FERGON) 324 MG tablet Take 324 mg by mouth daily with breakfast.    [provider]  furosemide (LASIX) 40 MG tablet Take 1 tablet (40 mg total) by mouth daily for 15 days. 06/16/20 07/01/20  Carrie Mew, MD  furosemide (LASIX) 40 MG tablet Take 1 tablet (40 mg total) by mouth 2 (two) times daily for 15 days. 06/18/20 07/03/20  Naaman Plummer, MD  gabapentin (NEURONTIN) 100 MG capsule Take 1 capsule (100 mg total) by mouth 3 (three) times daily. 02/07/20   Pokhrel, Corrie Mckusick, MD  gabapentin (NEURONTIN) 100 MG capsule Take 2 capsules (200 mg total) by mouth 3 (three) times daily. 06/20/20 08/19/20  Menshew, Dannielle Karvonen, PA-C  hydrALAZINE (APRESOLINE) 25 MG tablet Take 1 tablet (25 mg total) by mouth 4 (four) times  daily. 06/18/20 07/18/20  Naaman Plummer, MD  levothyroxine (SYNTHROID) 25 MCG tablet Take 25 mcg by mouth daily before breakfast.    [provider]  Menthol, Topical Analgesic, (BIOFREEZE ROLL-ON COLORLESS) 4 % GEL Apply 1 application topically daily.    [provider]  Nutritional Supplements (FEEDING SUPPLEMENT, NEPRO CARB STEADY,) LIQD Take 237 mLs by mouth 3 (three) times daily between meals. 01/19/20   Enzo Bi, MD  ondansetron (ZOFRAN) 4 MG tablet Take 4 mg by mouth every 8 (eight) hours as needed for nausea or vomiting.    [provider]  Pollen Extracts (PROSTAT PO) Take 50 mLs by mouth daily.    [provider]  vitamin B-12 (CYANOCOBALAMIN) 500 MCG tablet Take 500 mcg by mouth daily.    [provider]  Physical Exam: Vitals:   07/20/20 2344 07/21/20 0000 07/21/20 0030 07/21/20 0049  BP:  119/73 99/74 118/75  Pulse:  68 67 70  Resp:  18 (!) 22 (!) 22  Temp: 97.7 F (36.5 C)     TempSrc: Oral     SpO2:  100% 97% 100%  Weight:      Height:         Vitals:   07/20/20 2344 07/21/20 0000 07/21/20 0030 07/21/20 0049  BP:  119/73 99/74 118/75  Pulse:  68 67 70  Resp:  18 (!) 22 (!) 22  Temp: 97.7 F (36.5 C)     TempSrc: Oral     SpO2:  100% 97% 100%  Weight:      Height:          Constitutional: Alert and oriented x 3 . Not in any apparent distress HEENT:      Head: Normocephalic and atraumatic.         Eyes: PERLA, EOMI, Conjunctivae are normal. Sclera is non-icteric.       Mouth/Throat: Mucous membranes are moist.       Neck: Supple with no signs of meningismus. Cardiovascular: Regular rate and rhythm. No murmurs, gallops, or rubs. 2+ symmetrical distal pulses are present . No JVD. No LE edema Respiratory: Respiratory effort normal .Lungs sounds clear bilaterally. No wheezes, crackles, or rhonchi.  Gastrointestinal: Soft, epigastric and periumbilical tenderness, and non distended with positive bowel sounds.   Genitourinary: No CVA tenderness. Musculoskeletal: Nontender with normal range of motion in all extremities. No cyanosis, or erythema of extremities. Neurologic:  Face is symmetric. Moving all extremities. No gross focal neurologic deficits . Skin: Skin is warm, dry.  No rash or ulcers Psychiatric: Mood and affect are normal    Labs on Admission: I have personally reviewed following labs and imaging studies  CBC: Recent Labs  Lab 07/20/20 2343  WBC 12.3*  HGB 7.9*  HCT 25.8*  MCV 90.2  PLT [id number]   Basic Metabolic Panel: Recent Labs  Lab 07/20/20 2343  NA 139  K 4.1  CL 111  CO2 20*  GLUCOSE 114*  BUN 44*  CREATININE 2.77*  CALCIUM 8.2*   GFR: Estimated Creatinine Clearance: 39 mL/min (A) (by C-G formula based on SCr of 2.77 mg/dL (H)). Liver Function Tests: Recent Labs  Lab 07/20/20 2343  AST 11*  ALT 5  ALKPHOS 45  BILITOT 0.8  PROT 5.5*  ALBUMIN 2.9*   Recent Labs  Lab 07/20/20 2343  LIPASE 28   No results for input(s): AMMONIA in the last 168 hours. Coagulation Profile: Recent Labs  Lab 07/21/20 0029  INR 1.3*   Cardiac Enzymes: No results for input(s): CKTOTAL, CKMB, CKMBINDEX, TROPONINI in the last 168 hours. BNP (last 3 results) No results for input(s): PROBNP in the last 8760 hours. HbA1C: No results for input(s): HGBA1C in the last 72 hours. CBG: No results for input(s): GLUCAP in the last 168 hours. Lipid Profile: No results for input(s): CHOL, HDL, LDLCALC, TRIG, CHOLHDL, LDLDIRECT in the last 72 hours. Thyroid Function Tests: No results for input(s): TSH, T4TOTAL, FREET4, T3FREE, THYROIDAB in the last 72 hours. Anemia Panel: No results for input(s): VITAMINB12, FOLATE, FERRITIN, TIBC, IRON, RETICCTPCT in the last 72 hours. Urine analysis:    Component Value Date/Time   COLORURINE YELLOW (A) 06/19/2020 1523   APPEARANCEUR CLEAR (A) 06/19/2020 1523   APPEARANCEUR Clear 02/26/2014 1126   LABSPEC 1.012 06/19/2020 1523   LABSPEC  1.008 02/26/2014  [place] 5.0 06/19/2020 1523   [place] 06/19/2020 1523   GLUCOSEU Negative 02/26/2014 1126   HGBUR SMALL (A) 06/19/2020 1523   BILIRUBINUR NEGATIVE 06/19/2020 1523   BILIRUBINUR Negative 02/26/2014 1126   KETONESUR 5 (A) 06/19/2020 1523   PROTEINUR >=300 (A) 06/19/2020 1523   NITRITE NEGATIVE 06/19/2020 1523   LEUKOCYTESUR TRACE (A) 06/19/2020 1523   LEUKOCYTESUR 1+ 02/26/2014 1126    Radiological Exams on Admission: No results found.   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 fib not currently on anticoagulation, CKD 4,GI bleed 2015, OSA,presenting with rectal bleeding associated with an episode of vomiting, abdominal cramping and lightheadedness.    Hematochezia/GI bleed with history of GI bleed 2015 Acute blood loss anemia, with hypotension - Patient with 3 episodes of bright red blood per rectum associated with abdominal pain, vomiting, lightheadedness, SBP 80s with EMS responding to fluid bolus - Hemoglobin 7.9, down from 12.12 a month prior - Continue blood transfusion started in the emergency room - Posttransfusion H&H and serial hemoglobins thereafter - GI consult - Keep n.p.o. - Pepcid IV.  Has allergy to PPI, suspected interstitial nephritis with esomeprazole - Close hemodynamic monitoring    Paroxysmal atrial fibrillation (HCC) - Patient on Coreg and amiodarone, no longer on Eliquis - Currently in sinus rhythm - We will hold for tonight given initial hypotension and possibly ongoing bleeding    Chronic combined systolic and diastolic heart failure  Nonischemic cardiomyopathy ([place]) - last EF 45-50% December 2021 (previously 20% 2009, 45% 2018) - Patient followed by Dr. Rockey Situ - Holding all BP lowering meds for tonight  Nonobstructive CAD - No complaints of chest pain, EKG nonacute - No  antiplatelets.  Holding beta-blocker due to initial hypotension    Obstructive sleep apnea - CPAP has been recommended but not yet on it    CKD (chronic kidney disease) stage 4, GFR 15-29 ml/min (HCC) - Creatinine 2.77 which appears to be his baseline    DVT prophylaxis: SCDs Code Status: full code  Family Communication:  none  Disposition Plan: Back to previous home environment Consults called: GI  status:At the time of admission, it appears that the appropriate admission status for this patient is INPATIENT. This is judged to be reasonable and necessary in order to provide the required intensity of service to ensure the patient's safety given the presenting symptoms, physical exam findings, and initial radiographic and laboratory data in the context of their  Comorbid conditions.   Patient requires inpatient status due to high intensity of service, high risk for further deterioration and high frequency of surveillance required.   I certify that at the point of admission it is my clinical judgment that the patient will require inpatient hospital care spanning beyond [place] MD [facility]     07/21/2020, 1:06 AM

## 2020-07-21 NOTE — ED Notes (Signed)
Urine specimen requested, urinal provided.

## 2020-07-21 NOTE — Progress Notes (Signed)
Contacted by Dr. Si Raider regarding lower gastrointestinal hemorrhage and syncopal episode.  Reviewed images and chart, and agree that there is no indication for endovascular intervention at this point due to hemorrhage from distal rectum on NM study and relative clinical stability.  Agree with continued resuscitation as needed and colonoscopic evaluation/intervention.  If in the interim the patient experiences profound hemorrhagic shock with associated hematochezia, please notify our team.  Ruthann Cancer, MD Pager: [phone]

## 2020-07-21 NOTE — Consult Note (Signed)
GI Inpatient Consult Note  Reason for Consult: GI Bleed, acute blood loss anemia   Attending Requesting Consult: Dr. Laurey Arrow  History of Present Illness: Hector [name] is a 55 y.o. male seen for evaluation of acute blood loss anemia/GI bleed at the request of Dr. Laurey Arrow. Pt has a PMH of PMH of nonobstructive CAD, chronic combined CHF (last EF 45-50% 01/2020), paroxsymal atrial fibrillation not on chronic anticoagulation, CKD Stage IV, Hx of GI bleed in 2015, and OSA. Pt presented to the ED via EMS from home for rectal bleeding and lower abdominal pain. He reports he was in his usual state of health up until symptoms started. He reports around 6 PM last night he felt the urge to have a BM and went to the commode but passed dark blood with clots. He reports this was quite a significant amount. He then had two additional episodes just like this with associated lower abdominal cramping and one episode of NBNB emesis. He began to feel lightheaded so EMS was called. EMS reported SBP of 80 and a 500 mL NS bolus was given in route to the hospital. Upon arrival to the ED, vitals were normal with BP 119/73, HR 68, O2 sats 100% on room air. Labs showed acute blood loss anemia with hemoglobin 7.9, which was decreased from 12.2 a month ago. He had mild leukocytosis with WBC 12.3K, Cr 2.77 at baseline, and otherwise unremarkable. Per ED physician, rectal exam showed gross blood. GI was consulted for acute blood loss anemia.   Patient seen and examined this morning resting comfortably in hospital bed. He reports he has had one bowel movement this morning which showed dark red blood. He reports this was smaller amount than last night. He has only had one BM since admission. He denies any fever, nausea, further emesis episodes, heartburn, chest pain, shortness of breath, abdominal pain, or rectal pain. He has some mild hypogastric abd cramping just before a BM, but this is noticeably improved from yesterday. He  denies any frequent NSAID use at home. Of note, he was hospitalized 09/2015 for lower GI Bleed where he underwent evaluation with flexible sigmoidoscopy which non-bleeding Grade I internal hemorrhoids and one non-thrombosed external hemorrhoids with small amount of blood on rectal exam. He was discharged with instructions to use Analpram and increase fiber in his diet. He had folonoscopy and EGD in 08/2013 by Dr. Vira Agar which revealed a hiatal hernia, gastritis, as well as erythematous duodenopathy, as well as diverticulosis, and internal and external hemorrhoids. Hemoglobin this morning 7.0. He had COVID-19 in October last year and reports he has not been taking his Eliquis. He hasn't taken any of his home meds in a few months due to running out of them and not seeing his PCP.   Past Medical History:  Past Medical History:  Diagnosis Date  . Alcohol abuse   . 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 12/2007 MV: EF 28%, no isch;  b. 8/12 Echo: EF 25-35%; c. 02/2014 Echo: EF 20-25%; d. 12/2014 Cath: minimal CAD; e. 01/2015 Echo: EF 50-55%;  d. 05/2016 Echo: EF 30-35%, diff HK, gr2 DD; e. 11/2016 Echo: EF 45-50%, diff HK.  . Chronic combined systolic (congestive) and diastolic (congestive) heart failure ([place])    a. 05/2016 Most recent Echo: EF 30-35%, diff HK, Gr2 DD, mild MR, mod dil LA; b. 11/2016 Echo: EF 45-50%, diff HK.  . CKD (chronic kidney disease), stage III ([place])   . Elevated troponin  Chronically elevated. - level was 0.17-0.10 during recent admission  . Essential hypertension   . GI bleed 11/2013  . Hyperlipidemia   . Pancreatitis   . Paroxysmal A-fib ([place])    a. new onset s/p unsuccessful TEE/DCCV on 08/16/2014; b. on amio/eliquis (CHA2DS2VASc = 2-3); c. reports 1-2 hrs of afib ~ q35mo.  . Paroxysmal atrial flutter ([place]    a. new onset 07/2014; b. s/p unsuccessful TEE/DCCV 08/16/2014; c. on apixaban.  . Sleep-disordered breathing    Has yet to have a sleep study  . Stroke ([facility]      Problem List: Patient Active Problem List   Diagnosis Date Noted  . CKD (chronic kidney disease) stage 4, GFR 15-29 ml/min ([place] 07/21/2020  . GIB (gastrointestinal bleeding) 07/21/2020  . Acute blood loss anemia 07/21/2020  . Hyperthyroidism without thyroid nodule 02/03/2020  . Acute respiratory distress syndrome (ARDS) due to COVID-19 virus ([place] 12/31/2019  . Acute renal failure superimposed on stage 3b chronic kidney disease ([place] 12/31/2019  . Hyperkalemia 12/31/2019  . Hyponatremia 12/31/2019  . Obstructive sleep apnea 04/21/2016  . Nonischemic cardiomyopathy ([place] 05/26/2015  . Chronic combined systolic (congestive) and diastolic (congestive) heart failure ([place] 04/01/2015  . Essential hypertension 04/01/2015  . Sinus bradycardia 01/29/2015  . Paroxysmal atrial fibrillation ([place] 08/16/2014  . Paroxysmal atrial flutter ([place] 08/14/2014    Class: Acute  . Chronic kidney disease (CKD), stage III (moderate) ([place] 07/02/2014  . Hypertensive heart disease   . Hyperlipidemia     Past Surgical History: Past Surgical History:  Procedure Laterality Date  . CARDIAC CATHETERIZATION N/A 01/09/2015   Procedure: Left Heart Cath and Coronary Angiography;  Surgeon: DLeonie Man MD;  [facility];  Service: Cardiovascular;  Laterality: N/A;  . ELECTROPHYSIOLOGIC STUDY N/A 08/16/2014   Procedure: CARDIOVERSION;  Surgeon: TMinna Merritts MD;  Location: [facility] ORS;  Service: Cardiovascular;  Laterality: N/A;  . FLEXIBLE SIGMOIDOSCOPY N/A 10/10/2015   Procedure: FLEXIBLE SIGMOIDOSCOPY;  Surgeon: MLollie Sails MD;  [facility];  Service: Endoscopy;  Laterality: N/A;  . KNEE SURGERY Right   . NM MYOVIEW LTD  November 2011   No ischemia or infarction. EF 50-55% ( no improvement from 2009 Myoview EF of 28%  . TEE WITHOUT CARDIOVERSION N/A 08/16/2014   Procedure: TRANSESOPHAGEAL ECHOCARDIOGRAM (TEE);  Surgeon: TMinna Merritts MD;  Location: [facility] ORS;   Service: Cardiovascular;  Laterality: N/A;    Allergies: Allergies  Allergen Reactions  . Esomeprazole Magnesium Other (See Comments)    Suspected interstitial nephritis 2018  . Eggs Or Egg-Derived Products Rash    Home Medications: Medications Prior to Admission  Medication Sig Dispense Refill Last Dose  . allopurinol (ZYLOPRIM) 100 MG tablet Take 100 mg by mouth daily as needed.     .Marland Kitchenamiodarone (PACERONE) 200 MG tablet Take 1 tablet (200 mg total) by mouth daily. 90 tablet 3   . apixaban (ELIQUIS) 5 MG TABS tablet Take 1 tablet (5 mg total) by mouth 2 (two) times daily. 60 tablet 5   . atorvastatin (LIPITOR) 80 MG tablet Take 1 tablet (80 mg total) by mouth daily. 30 tablet 5   . carvedilol (COREG) 6.25 MG tablet Take 1 tablet (6.25 mg total) by mouth 2 (two) times daily with a meal.     . docusate sodium (COLACE) 100 MG capsule Take 100 mg by mouth daily.     . famotidine (PEPCID) 20 MG tablet Take 20 mg by mouth daily.     .Marland Kitchen  ferrous gluconate (FERGON) 324 MG tablet Take 324 mg by mouth daily with breakfast.     . furosemide (LASIX) 40 MG tablet Take 1 tablet (40 mg total) by mouth daily for 15 days. 15 tablet 0   . furosemide (LASIX) 40 MG tablet Take 1 tablet (40 mg total) by mouth 2 (two) times daily for 15 days. 30 tablet 0   . gabapentin (NEURONTIN) 100 MG capsule Take 1 capsule (100 mg total) by mouth 3 (three) times daily.     Marland Kitchen gabapentin (NEURONTIN) 100 MG capsule Take 2 capsules (200 mg total) by mouth 3 (three) times daily. 180 capsule 1   . hydrALAZINE (APRESOLINE) 25 MG tablet Take 1 tablet (25 mg total) by mouth 4 (four) times daily. 120 tablet 0   . levothyroxine (SYNTHROID) 25 MCG tablet Take 25 mcg by mouth daily before breakfast.     . Menthol, Topical Analgesic, (BIOFREEZE ROLL-ON COLORLESS) 4 % GEL Apply 1 application topically daily.     . Nutritional Supplements (FEEDING SUPPLEMENT, NEPRO CARB STEADY,) LIQD Take 237 mLs by mouth 3 (three) times daily between  meals.  0   . ondansetron (ZOFRAN) 4 MG tablet Take 4 mg by mouth every 8 (eight) hours as needed for nausea or vomiting.     . Pollen Extracts (PROSTAT PO) Take 50 mLs by mouth daily.     . vitamin B-12 (CYANOCOBALAMIN) 500 MCG tablet Take 500 mcg by mouth daily.      Home medication reconciliation was completed with the patient.   Scheduled Inpatient Medications:   . atorvastatin  80 mg Oral Daily    Continuous Inpatient Infusions:   . sodium chloride 100 mL/hr at 07/21/20 0004  . sodium chloride    . sodium chloride 50 mL/hr at 07/21/20 0342  . famotidine (PEPCID) IV 20 mg (07/21/20 0308)    PRN Inpatient Medications:  acetaminophen **OR** acetaminophen, HYDROcodone-acetaminophen, morphine injection, ondansetron **OR** ondansetron (ZOFRAN) IV  Family History: family history includes Diabetes in his mother; Hyperlipidemia in his mother; Hypertension in his mother.  The patient's family history is negative for inflammatory bowel disorders, GI malignancy, or solid organ transplantation.  Social History:   reports that he quit smoking about 22 years ago. He has a 12.00 pack-year smoking history. He has never used smokeless tobacco. He reports previous alcohol use. He reports that he does not use drugs. The patient denies ETOH, tobacco, or drug use.   Review of Systems: Constitutional: Weight is stable.  Eyes: No changes in vision. ENT: No oral lesions, sore throat.  GI: see HPI.  Heme/Lymph: No easy bruising.  CV: No chest pain.  GU: No hematuria.  Integumentary: No rashes.  Neuro: No headaches.  Psych: No depression/anxiety.  Endocrine: No heat/cold intolerance.  Allergic/Immunologic: No urticaria.  Resp: No cough, SOB.  Musculoskeletal: No joint swelling.    Physical Examination: BP 97/74 (BP Location: Right Arm)   Pulse 80   Temp 97.6 F (36.4 C)   Resp 16   Ht '5\' 11"'$  (1.803 m)   Wt 116 kg   SpO2 100%   BMI 35.67 kg/m  Gen: NAD, alert and oriented x 4 HEENT:  PEERLA, EOMI, Neck: supple, no JVD or thyromegaly Chest: CTA bilaterally, no wheezes, crackles, or other adventitious sounds CV: RRR, no m/g/c/r Abd: soft, NT, ND, +BS in all four quadrants; no HSM, guarding, ridigity, or rebound tenderness Ext: no edema, well perfused with 2+ pulses, Skin: no rash or lesions noted Lymph: no LAD  Data: Lab Results  Component Value Date   WBC 7.8 07/21/2020   HGB 7.0 (L) 07/21/2020   HCT 22.4 (L) 07/21/2020   MCV 89.2 07/21/2020   PLT 208 07/21/2020   Recent Labs  Lab 07/20/20 2343 07/21/20 0823  HGB 7.9* 7.0*   Lab Results  Component Value Date   NA 139 07/20/2020   K 4.1 07/20/2020   CL 111 07/20/2020   CO2 20 (L) 07/20/2020   BUN 44 (H) 07/20/2020   CREATININE 2.77 (H) 07/20/2020   Lab Results  Component Value Date   ALT 5 07/20/2020   AST 11 (L) 07/20/2020   ALKPHOS 45 07/20/2020   BILITOT 0.8 07/20/2020   Recent Labs  Lab 07/21/20 0029  INR 1.3*   Assessment/Plan:  55 y/o AA male with a PMH of nonobstructive CAD, chronic combined CHF (last EF 45-50% 01/2020), paroxsymal atrial fibrillation not on chronic anticoagulation, CKD Stage IV, Hx of GI bleed in 2015, and OSA admitted to the hospital last night for GI Bleed  1. Acute blood loss anemia/Lower GI Bleed: DDx includes diverticular bleed, colonic AVMs, colorectal adenocarcinoma, advanced adenomas, ischemic colitis, IBD, anal outlet etiology, less likely UGI source given his clinical presentation  2. Paroxsymal atrial fibrillation - he has not been taking his Eliquis for a few months. Holding currently given GIB.   3. Chronic combined systolic/diastolic CHF  4. Hx of GIB - hospitalized 09/2015 for GIB where flex sig showed small bleeding external hemorrhoid and otherwise normal up to 30cm   Recommendations:  - Continue to monitor H&H closely. Given hemoglobin is 7.0 this morning, advise 1 unit pRBCs today. Transfuse for Hgb <7.0.  - Agree with IV acid suppression.  -  Continue to hold all anticoagulation/antiplatelet therapy - Pt reports one episode of hematochezia this morning which was smaller amount. If he has further episodes of hematochezia today, would advise tagged RBC scan.  - Advise colonoscopy for luminal evaluation. We reviewed procedure details and indications in room today. He consents to proceed.  - Clear liquid diet today. NPO after midnight.  - Plan for colonoscopy tomorrow with Dr. Alice Reichert - Further recommendations after procedure  I reviewed the risks (including bleeding, perforation, infection, anesthesia complications, cardiac/respiratory complications), benefits and alternatives of colonoscopy. Patient consents to proceed.    Thank you for the consult. Please call with questions or concerns.  [facility] [phone] (Cell)

## 2020-07-21 NOTE — Progress Notes (Addendum)
PROGRESS NOTE    Hector [name]  [id number] DOB: March 11, 1965 DOA: 07/20/2020 PCP: [name], MD  Outpatient Specialists: [facility]    Brief Narrative:   Hector [name] is a 55 y.o. 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 fib not currently on anticoagulation, CKD 4,  GI bleed 2015, OSA, who was in his usual state of health until a few hours prior to arrival when he felt a sense of urgency and had a bowel movement that was dark blood.  He went on to have 2 additional episodes.  It was associated with abdominal cramping in the lower abdomen and one episode of nonbloody nonbilious vomiting..  After the last episode he felt lightheaded.  EMS reported SBP of 80 and administered 500 mL NS bolus in route. ED course: Upon arrival, afebrile, BP 119/73, pulse 68, pulse ox 100% on room air.  Hemoglobin 7.9, down from 12.2 a month prior, WBC 12,300, creatinine 2.77 which is at baseline, lipase 28.  Gross blood on rectal exam in the ER.   Assessment & Plan:   Principal Problem:   GIB (gastrointestinal bleeding) Active Problems:   Paroxysmal atrial fibrillation (HCC)   Chronic combined systolic (congestive) and diastolic (congestive) heart failure (HCC)   Nonischemic cardiomyopathy (HCC)   Obstructive sleep apnea   CKD (chronic kidney disease) stage 4, GFR 15-29 ml/min (HCC)   Acute blood loss anemia   Hematochezia/GI bleed with history of GI bleed 2015 Acute blood loss anemia, with hypotension Patient with 3 episodes of bright red blood per rectum associated with mild abdominal pain, vomiting, lightheadedness, SBP 80s with EMS responding to fluid bolus. Hemoglobin 7.9, down from 12.12 a month prior. Currently hemodynamically stable. Transfused 1 unit. Admitted 2017 for rectal  bleeding, flex sig at that time found bleeding hemorrhoids. Prescribed eliquis but hasn't taken in months. Denies nsaid use. Last alcoholic drink 7 years ago. Repeat hgb 7. Rapid response 5/29 @ noon, pt syncopized on commode in setting of large bloody bm. Transferred to bed, no fall. Hemodynamically stable - serial h/h - h2 blocker (esomeprazole contraindication, appears caused interstitial nephritis) - npo - gi following - 2nd unit prbcs running, will order a 3rd - 2 PIVs - dic panel - serial vitals - stat tagged rbc scan ordered  Paroxysmal atrial fibrillation (HCC) Pt reports hasn't taken home meds in months - resume home atorvastatin - hold home carvedilol and amiodarone for now - obviously holding eliquis which pt says hasn't taken for months    Chronic combined systolic and diastolic heart failure  Nonischemic cardiomyopathy ([place]) - last EF 45-50% December 2021 (previously 20% 2009, 45% 2018) - Patient followed by Dr. Rockey Situ - not taking home coreg, lasix. Holding them for now while actively bleeding  Nonobstructive CAD - No complaints of chest pain, EKG nonacute - No antiplatelets.  Holding beta-blocker due to initial hypotension    Obstructive sleep apnea - CPAP has been recommended but not yet on it    CKD (chronic kidney disease) stage 4, GFR 15-29 ml/min (HCC) - Creatinine 2.77 which appears to be his baseline   DVT prophylaxis: scds Code Status: full Family Communication: mother updated telephonically 5/29.  Level of care: Progressive Cardiac Status is: Inpatient  Remains inpatient appropriate because:Inpatient level of care appropriate due to severity of illness   Dispo: The  patient is from: Home              Anticipated d/c is to: Home              Patient currently is not medically stable to d/c.   Difficult to place patient No    Consultants:  GI  Procedures: none  Antimicrobials:  none    Subjective: This morning asleep in bed,  rouses. Says mild abd pain. No n/v/d. Did have bloody bowel movement this morning.   Objective: Vitals:   07/21/20 0354 07/21/20 0428 07/21/20 0429 07/21/20 0627  BP: 1'12/86 99/71 99/71 '$ (!) 119/93  Pulse: 72 75 75 74  Resp: '18 18 18 18  '$ Temp: 98.6 F (37 C) 97.8 F (36.6 C) 97.8 F (36.6 C) 98.6 F (37 C)  TempSrc: Oral  Oral Oral  SpO2: 99%  91% 98%  Weight:      Height:        Intake/Output Summary (Last 24 hours) at 07/21/2020 0726 Last data filed at 07/21/2020 [id number] Gross per 24 hour  Intake 300 ml  Output --  Net 300 ml   Filed Weights   07/20/20 2337  Weight: 116 kg    Examination:  General exam: Appears calm and comfortable  Respiratory system: Clear to auscultation. Respiratory effort normal. Cardiovascular system: S1 & S2 heard, RRR. No JVD, murmurs, rubs, gallops or clicks. 1+ LE edema. Gastrointestinal system: Abdomen is obese, soft and with mild diffuse ttp, no rebound. No organomegaly or masses felt. Normal bowel sounds heard. Central nervous system: Alert and oriented. No focal neurological deficits. Extremities: Symmetric 5 x 5 power. Skin: No rashes, lesions or ulcers Psychiatry: Judgement and insight appear normal. Mood & affect appropriate.     Data Reviewed: I have personally reviewed following labs and imaging studies  CBC: Recent Labs  Lab 07/20/20 2343  WBC 12.3*  HGB 7.9*  HCT 25.8*  MCV 90.2  PLT [id number]   Basic Metabolic Panel: Recent Labs  Lab 07/20/20 2343  NA 139  K 4.1  CL 111  CO2 20*  GLUCOSE 114*  BUN 44*  CREATININE 2.77*  CALCIUM 8.2*   GFR: Estimated Creatinine Clearance: 39 mL/min (A) (by C-G formula based on SCr of 2.77 mg/dL (H)). Liver Function Tests: Recent Labs  Lab 07/20/20 2343  AST 11*  ALT 5  ALKPHOS 45  BILITOT 0.8  PROT 5.5*  ALBUMIN 2.9*   Recent Labs  Lab 07/20/20 2343  LIPASE 28   No results for input(s): AMMONIA in the last 168 hours. Coagulation Profile: Recent Labs  Lab  07/21/20 0029  INR 1.3*   Cardiac Enzymes: No results for input(s): CKTOTAL, CKMB, CKMBINDEX, TROPONINI in the last 168 hours. BNP (last 3 results) No results for input(s): PROBNP in the last 8760 hours. HbA1C: No results for input(s): HGBA1C in the last 72 hours. CBG: No results for input(s): GLUCAP in the last 168 hours. Lipid Profile: No results for input(s): CHOL, HDL, LDLCALC, TRIG, CHOLHDL, LDLDIRECT in the last 72 hours. Thyroid Function Tests: No results for input(s): TSH, T4TOTAL, FREET4, T3FREE, THYROIDAB in the last 72 hours. Anemia Panel: No results for input(s): VITAMINB12, FOLATE, FERRITIN, TIBC, IRON, RETICCTPCT in the last 72 hours. Urine analysis:    Component Value Date/Time   COLORURINE YELLOW (A) 07/20/2020 2343   APPEARANCEUR CLEAR (A) 07/20/2020 2343   APPEARANCEUR Clear 02/26/2014 1126   LABSPEC 1.016 07/20/2020 2343   LABSPEC 1.008 02/26/2014 1126   PHURINE 5.0 07/20/2020  [place] 07/20/2020 2343   GLUCOSEU Negative 02/26/2014 1126   [place] 07/20/2020 2343   BILIRUBINUR NEGATIVE 07/20/2020 2343   BILIRUBINUR Negative 02/26/2014 [place] 07/20/2020 2343   PROTEINUR 100 (A) 07/20/2020 2343   NITRITE NEGATIVE 07/20/2020 2343   LEUKOCYTESUR TRACE (A) 07/20/2020 2343   LEUKOCYTESUR 1+ 02/26/2014 1126   Sepsis Labs: '@LABRCNTIP'$ (procalcitonin:4,lacticidven:4)  ) Recent Results (from the past 240 hour(s))  Resp Panel by RT-PCR (Flu A&B, Covid) Nasopharyngeal Swab     Status: None   Collection Time: 07/21/20 12:05 AM   Specimen: Nasopharyngeal Swab; Nasopharyngeal(NP) swabs in vial transport medium  Result Value Ref Range Status   SARS Coronavirus 2 by RT PCR NEGATIVE NEGATIVE Final    Comment: (NOTE) SARS-CoV-2 target nucleic acids are NOT DETECTED.  The SARS-CoV-2 RNA is generally detectable in upper respiratory specimens during the acute phase of infection. The lowest concentration of SARS-CoV-2 viral  copies this assay can detect is 138 copies/mL. A negative result does not preclude SARS-Cov-2 infection and should not be used as the sole basis for treatment or other patient management decisions. A negative result may occur with  improper specimen collection/handling, submission of specimen other than nasopharyngeal swab, presence of viral mutation(s) within the areas targeted by this assay, and inadequate number of viral copies(<138 copies/mL). A negative result must be combined with clinical observations, patient history, and epidemiological information. The expected result is Negative.  Fact Sheet for Patients:  EntrepreneurPulse.com.au  Fact Sheet for Healthcare Providers:  IncredibleEmployment.be  This test is no t yet approved or cleared by the [place] FDA and  has been authorized for detection and/or diagnosis of SARS-CoV-2 by FDA under an Emergency Use Authorization (EUA). This EUA will remain  in effect (meaning this test can be used) for the duration of the COVID-19 declaration under Section 564(b)(1) of the Act, 21 U.S.C.section 360bbb-3(b)(1), unless the authorization is terminated  or revoked sooner.       Influenza A by PCR NEGATIVE NEGATIVE Final   Influenza B by PCR NEGATIVE NEGATIVE Final    Comment: (NOTE) The Xpert Xpress SARS-CoV-2/FLU/RSV plus assay is intended as an aid in the diagnosis of influenza from Nasopharyngeal swab specimens and should not be used as a sole basis for treatment. Nasal washings and aspirates are unacceptable for Xpert Xpress SARS-CoV-2/FLU/RSV testing.  Fact Sheet for Patients: EntrepreneurPulse.com.au  Fact Sheet for Healthcare Providers: IncredibleEmployment.be  This test is not yet approved or cleared by the [place] FDA and has been authorized for detection and/or diagnosis of SARS-CoV-2 by FDA under an Emergency Use Authorization (EUA). This  EUA will remain in effect (meaning this test can be used) for the duration of the COVID-19 declaration under Section 564(b)(1) of the Act, 21 U.S.C. section 360bbb-3(b)(1), unless the authorization is terminated or revoked.  Performed at [facility], [address], [place], [place] [address]          Radiology Studies: No results found.      Scheduled Meds: Continuous Infusions: . sodium chloride 100 mL/hr at 07/21/20 0004  . sodium chloride    . sodium chloride 50 mL/hr at 07/21/20 0342  . famotidine (PEPCID) IV 20 mg (07/21/20 0308)     LOS: 0 days    Time spent: 70 min    Desma Maxim, MD [facility]   If 7PM-7AM, please contact night-coverage [email or web address] Password [facility] 07/21/2020, 7:26 AM

## 2020-07-21 NOTE — ED Notes (Signed)
Blood consent signed by patient and witnessed by this RN at this time.

## 2020-07-21 NOTE — Progress Notes (Signed)
Patient had syncopal episode on bedside commode prior to initiating blood transfusion. RN witnessed/ was in room at time of episode. Patient did not hit floor however passed out across the bed while on the [facility]. Rapid called, Dr. Si Raider notified and reported to bedside. Please see new orders.      07/21/20 1156  Vitals  Temp 98.5 F (36.9 C)  Temp Source Oral  BP 90/69  MAP (mmHg) 79  BP Location Left Leg  BP Method Automatic  Patient Position (if appropriate) Lying  Pulse Rate 77  Pulse Rate Source Monitor  Resp 20  MEWS COLOR  MEWS Score Color Green  Oxygen Therapy  SpO2 97 %  O2 Device Room Air  MEWS Score  MEWS Temp 0  MEWS Systolic 1  MEWS Pulse 0  MEWS RR 0  MEWS LOC 0  MEWS Score 1

## 2020-07-21 NOTE — Significant Event (Signed)
Rapid Response Event Note   Reason for Call : called RRT for possible syncopal episode while on BSC. Pt in with GIB. Had lg bloody BM while on BSC.   Initial Focused Assessment: Helped back to bed by staff, VSS, new IV started, blood restarted, Dr Alice Reichert and Dr Si Raider in to check on patient.      Interventions: Serial HGB's, planned colonoscopy tomorrow in endo. Nausea medication given.   Plan of Care: as above, Radiographer, therapeutic to call for further assistance.   Event Summary:   MD Notified: Dr Si Raider 1210 Call Time:1205 Arrival Time:1206 End Time:1230  Hector [name] A, RN

## 2020-07-21 NOTE — Progress Notes (Signed)
Patient receiving  Unit of blood at this time, no adverse reaction noted

## 2020-07-21 NOTE — Progress Notes (Signed)
Patient had large amount of bright red blood form rectum , PRBC transfusion completed at this time

## 2020-07-22 ENCOUNTER — Other Ambulatory Visit: Payer: Self-pay

## 2020-07-22 ENCOUNTER — Inpatient Hospital Stay: Payer: Medicaid Other | Admitting: Anesthesiology

## 2020-07-22 ENCOUNTER — Encounter: Payer: Self-pay | Admitting: Internal Medicine

## 2020-07-22 ENCOUNTER — Encounter
Admission: EM | Disposition: A | Discharge status: Home / Self Care | Payer: Self-pay | Source: Home / Self Care | Attending: Obstetrics and Gynecology

## 2020-07-22 HISTORY — PX: COLONOSCOPY: SHX5424

## 2020-07-22 LAB — CBC
HCT: 15.6 % — ABNORMAL LOW (ref 39.0–52.0)
HCT: 25.9 % — ABNORMAL LOW (ref 39.0–52.0)
HCT: 22.2 % — ABNORMAL LOW (ref 39.0–52.0)
HCT: 19.5 % — ABNORMAL LOW (ref 39.0–52.0)
Hemoglobin: 5.1 g/dL — ABNORMAL LOW (ref 13.0–17.0)
Hemoglobin: 8.7 g/dL — ABNORMAL LOW (ref 13.0–17.0)
Hemoglobin: 7.6 g/dL — ABNORMAL LOW (ref 13.0–17.0)
Hemoglobin: 6.6 g/dL — ABNORMAL LOW (ref 13.0–17.0)
MCH: 29.5 pg (ref 26.0–34.0)
MCH: 30.7 pg (ref 26.0–34.0)
MCH: 30.6 pg (ref 26.0–34.0)
MCH: 30.6 pg (ref 26.0–34.0)
MCHC: 32.7 g/dL (ref 30.0–36.0)
MCHC: 33.6 g/dL (ref 30.0–36.0)
MCHC: 34.2 g/dL (ref 30.0–36.0)
MCHC: 33.8 g/dL (ref 30.0–36.0)
MCV: 90.2 fL (ref 80.0–100.0)
MCV: 91.5 fL (ref 80.0–100.0)
MCV: 89.5 fL (ref 80.0–100.0)
MCV: 90.3 fL (ref 80.0–100.0)
Platelets: 191 10*3/uL (ref 150–400)
Platelets: 168 10*3/uL (ref 150–400)
Platelets: 155 10*3/uL (ref 150–400)
Platelets: 154 10*3/uL (ref 150–400)
RBC: 1.73 MIL/uL — ABNORMAL LOW (ref 4.22–5.81)
RBC: 2.83 MIL/uL — ABNORMAL LOW (ref 4.22–5.81)
RBC: 2.48 MIL/uL — ABNORMAL LOW (ref 4.22–5.81)
RBC: 2.16 MIL/uL — ABNORMAL LOW (ref 4.22–5.81)
RDW: 16.1 % — ABNORMAL HIGH (ref 11.5–15.5)
RDW: 15.5 % (ref 11.5–15.5)
RDW: 16.1 % — ABNORMAL HIGH (ref 11.5–15.5)
RDW: 16.3 % — ABNORMAL HIGH (ref 11.5–15.5)
WBC: 13 10*3/uL — ABNORMAL HIGH (ref 4.0–10.5)
WBC: 16.1 10*3/uL — ABNORMAL HIGH (ref 4.0–10.5)
WBC: 16.8 10*3/uL — ABNORMAL HIGH (ref 4.0–10.5)
WBC: 14.4 10*3/uL — ABNORMAL HIGH (ref 4.0–10.5)
nRBC: 0 % (ref 0.0–0.2)
nRBC: 0 % (ref 0.0–0.2)
nRBC: 0.1 % (ref 0.0–0.2)
nRBC: 0.1 % (ref 0.0–0.2)

## 2020-07-22 LAB — BASIC METABOLIC PANEL
Anion gap: 7 (ref 5–15)
BUN: 41 mg/dL — ABNORMAL HIGH (ref 6–20)
CO2: 18 mmol/L — ABNORMAL LOW (ref 22–32)
Calcium: 7.3 mg/dL — ABNORMAL LOW (ref 8.9–10.3)
Chloride: 113 mmol/L — ABNORMAL HIGH (ref 98–111)
Creatinine, Ser: 2.54 mg/dL — ABNORMAL HIGH (ref 0.61–1.24)
GFR, Estimated: 29 mL/min — ABNORMAL LOW (ref >60)
Glucose, Bld: 129 mg/dL — ABNORMAL HIGH (ref 70–99)
Potassium: 5.2 mmol/L — ABNORMAL HIGH (ref 3.5–5.1)
Sodium: 138 mmol/L (ref 135–145)

## 2020-07-22 LAB — PREPARE RBC (CROSSMATCH)

## 2020-07-22 SURGERY — COLONOSCOPY
Anesthesia: General

## 2020-07-22 MED ORDER — PROPOFOL 10 MG/ML IV BOLUS
INTRAVENOUS | Status: DC | PRN
  Administered 2020-07-22: 50 mg via INTRAVENOUS

## 2020-07-22 MED ORDER — DEXMEDETOMIDINE (PRECEDEX) IN NS 20 MCG/5ML (4 MCG/ML) IV SYRINGE
PREFILLED_SYRINGE | INTRAVENOUS | Status: DC | PRN
  Administered 2020-07-22: 16 ug via INTRAVENOUS
  Administered 2020-07-22: 4 ug via INTRAVENOUS

## 2020-07-22 MED ORDER — CALCIUM CHLORIDE 10 % IV SOLN
INTRAVENOUS | Status: DC | PRN
  Administered 2020-07-22 (×2): 500 mg via INTRAVENOUS

## 2020-07-22 MED ORDER — DEXMEDETOMIDINE (PRECEDEX) IN NS 20 MCG/5ML (4 MCG/ML) IV SYRINGE
PREFILLED_SYRINGE | INTRAVENOUS | Status: AC
  Filled 2020-07-22: qty 5

## 2020-07-22 MED ORDER — PROPOFOL 500 MG/50ML IV EMUL
INTRAVENOUS | Status: DC | PRN
  Administered 2020-07-22: 120 ug/kg/min via INTRAVENOUS

## 2020-07-22 MED ORDER — PHENYLEPHRINE HCL (PRESSORS) 10 MG/ML IV SOLN
INTRAVENOUS | Status: DC | PRN
  Administered 2020-07-22: 200 ug via INTRAVENOUS
  Administered 2020-07-22 (×3): 100 ug via INTRAVENOUS
  Administered 2020-07-22: 200 ug via INTRAVENOUS
  Administered 2020-07-22 (×2): 100 ug via INTRAVENOUS
  Administered 2020-07-22 (×2): 200 ug via INTRAVENOUS
  Administered 2020-07-22 (×3): 100 ug via INTRAVENOUS
  Administered 2020-07-22: 200 ug via INTRAVENOUS

## 2020-07-22 MED ORDER — SODIUM CHLORIDE 0.9 % IV BOLUS
1000.0000 mL | Freq: Once | INTRAVENOUS | Status: AC
  Administered 2020-07-22: 1000 mL via INTRAVENOUS

## 2020-07-22 MED ORDER — PROPOFOL 500 MG/50ML IV EMUL
INTRAVENOUS | Status: AC
  Filled 2020-07-22: qty 50

## 2020-07-22 MED ORDER — PROPOFOL 10 MG/ML IV BOLUS
INTRAVENOUS | Status: AC
  Filled 2020-07-22: qty 20

## 2020-07-22 MED ORDER — CALCIUM CHLORIDE 10 % IV SOLN
INTRAVENOUS | Status: AC
  Filled 2020-07-22: qty 10

## 2020-07-22 MED ORDER — SODIUM CHLORIDE 0.9% IV SOLUTION: Freq: Once | INTRAVENOUS | Status: DC

## 2020-07-22 NOTE — Transfer of Care (Signed)
Immediate Anesthesia Transfer of Care Note  Patient: Hector [name]  Procedure(s) Performed: COLONOSCOPY (N/A )  Patient Location: PACU  Anesthesia Type:General  Level of Consciousness: drowsy and patient cooperative  Airway & Oxygen Therapy: Patient Spontanous Breathing  Post-op Assessment: Report given to RN and Post -op Vital signs reviewed and stable  Post vital signs: Reviewed and stable  Last Vitals:  Vitals Value Taken Time  BP 92/48 07/22/20 1200  Temp 36.1 C 07/22/20 1148  Pulse 77 07/22/20 1204  Resp 15 07/22/20 1204  SpO2 100 % 07/22/20 1204  Vitals shown include unvalidated device data.  Last Pain:  Vitals:   07/22/20 1158  TempSrc:   PainSc: 0-No pain         Complications: No complications documented.

## 2020-07-22 NOTE — Anesthesia Preprocedure Evaluation (Addendum)
Anesthesia Evaluation  Patient identified by MRN, date of birth, ID band Patient awake    Reviewed: Allergy & Precautions, NPO status , Patient's Chart, lab work & pertinent test results  History of Anesthesia Complications Negative for: history of anesthetic complications  Airway Mallampati: III  TM Distance: >3 FB Neck ROM: Full    Dental  (+) Poor Dentition, Missing   Pulmonary sleep apnea , neg COPD, Patient abstained from smoking.Not current smoker, former smoker,    Pulmonary exam normal breath sounds clear to auscultation       Cardiovascular Exercise Tolerance: Good METShypertension, Pt. on medications +CHF  (-) CAD and (-) Past MI + dysrhythmias Atrial Fibrillation  Rhythm:Regular Rate:Normal - Systolic murmurs last EF Q000111Q December 2021 (previously 20% 2009, 45% 2018),    Neuro/Psych negative neurological ROS  negative psych ROS   GI/Hepatic neg GERD  ,(+)     (-) substance abuse  ,   Endo/Other  neg diabetesHypothyroidism   Renal/GU CRFRenal disease     Musculoskeletal   Abdominal   Peds  Hematology  (+) anemia ,   Anesthesia Other Findings Past Medical History: No date: Alcohol abuse [id number]: Alcoholic cardiomyopathy ([place])     Comment:  a. 12/2007 MV: EF 28%, no isch;  b. 8/12 Echo: EF               25-35%; c. 02/2014 Echo: EF 20-25%; d. 12/2014 Cath:               minimal CAD; e. 01/2015 Echo: EF 50-55%;  d. 05/2016 Echo:              EF 30-35%, diff HK, gr2 DD; e. 11/2016 Echo: EF 45-50%,               diff HK. No date: Chronic combined systolic (congestive) and diastolic  (congestive) heart failure ([place])     Comment:  a. 05/2016 Most recent Echo: EF 30-35%, diff HK, Gr2 DD,               mild MR, mod dil LA; b. 11/2016 Echo: EF 45-50%, diff HK. No date: CKD (chronic kidney disease), stage III (HCC) No date: Elevated troponin     Comment:  Chronically elevated. - level was 0.17-0.10 during                recent admission No date: Essential hypertension 11/2013: GI bleed No date: Hyperlipidemia No date: Pancreatitis No date: Paroxysmal A-fib ([place])     Comment:  a. new onset s/p unsuccessful TEE/DCCV on 08/16/2014; b.               on amio/eliquis (CHA2DS2VASc = 2-3); c. reports 1-2 hrs               of afib ~ q65mo. No date: Paroxysmal atrial flutter ([place]     Comment:  a. new onset 07/2014; b. s/p unsuccessful TEE/DCCV               08/16/2014; c. on apixaban. No date: Sleep-disordered breathing     Comment:  Has yet to have a sleep study No date: Stroke ([facility]  Reproductive/Obstetrics                            Anesthesia Physical Anesthesia Plan  ASA: III and emergent  Anesthesia Plan: General   Post-op Pain Management:    Induction: Intravenous  PONV Risk Score and Plan:  2 and Ondansetron, Propofol infusion, TIVA and Treatment may vary due to age or medical condition  Airway Management Planned: Simple Face Mask and Natural Airway  Additional Equipment: None  Intra-op Plan:   Post-operative Plan: Possible Post-op intubation/ventilation  Informed Consent: I have reviewed the patients History and Physical, chart, labs and discussed the procedure including the risks, benefits and alternatives for the proposed anesthesia with the patient or authorized representative who has indicated his/her understanding and acceptance.     Dental advisory given  Plan Discussed with: CRNA and Surgeon  Anesthesia Plan Comments: (Discussed risks of anesthesia with patient, including possibility of difficulty with spontaneous ventilation under anesthesia necessitating airway intervention, PONV, and rare risks such as cardiac or respiratory or neurological events. Patient understands. Will perform in the OR due to patient's instability and blood product requirement.)        Anesthesia Quick Evaluation

## 2020-07-22 NOTE — Progress Notes (Signed)
Patient noted ton be hypotensive BP 80/47 complain of dizziness. Dr. Si Raider notified new orders  received

## 2020-07-22 NOTE — Op Note (Signed)
[facility] Gastroenterology Patient Name: Hector [name] Procedure Date: 07/22/2020 7:53 AM MRN: [id number] Account #: [id number] Date of Birth: [birth year] Admit Type: Inpatient Age: 55 Room: OR 9 Gender: Male Note Status: Finalized Procedure:             Colonoscopy Indications:           Hematochezia, Evaluation of abnormal imaging study                         (likely to be clinically significant), Positive                         bleeding scan (presumed rectal origin). Providers:             Benay Pike. Alice Reichert MD, MD Referring MD:          Baxter Kail. Rebeca Alert MD, MD (Referring MD) Medicines:             Propofol per Anesthesia Complications:         No immediate complications. Estimated blood loss:                         Minimal. Procedure:             Pre-Anesthesia Assessment:                        - The risks and benefits of the procedure and the                         sedation options and risks were discussed with the                         patient. All questions were answered and informed                         consent was obtained.                        - ASA Grade Assessment: IV - A patient with severe                         systemic disease that is a constant threat to life.                        - After reviewing the risks and benefits, the patient                         was deemed in satisfactory condition to undergo the                         procedure.                        After obtaining informed consent, the colonoscope was                         passed under direct vision. Throughout the procedure,                         the patient's blood pressure,  pulse, and oxygen                         saturations were monitored continuously. The                         Colonoscope was introduced through the anus and                         advanced to the the terminal ileum, with                         identification of the appendiceal  orifice and IC                         valve. The colonoscopy was somewhat difficult due to                         poor endoscopic visualization. Successful completion                         of the procedure was aided by lavage. The patient                         tolerated the procedure well. The quality of the bowel                         preparation was fair. The terminal ileum, ileocecal                         valve, appendiceal orifice, and rectum were                         photographed. Findings:      The perianal and digital rectal examinations were normal. Pertinent       negatives include normal sphincter tone and no palpable rectal lesions.      The terminal ileum appeared normal.      Multiple small and large-mouthed diverticula were found in the entire       colon. There was no evidence of diverticular bleeding. There was       evidence of recent bleeding in the colon with red blood and clots. More       blood in the left colon suggesting this as the general origin of       presumed diverticular bleeding.      Red blood was found in the entire colon. Lavage of the area was       performed using a large amount of sterile water, resulting in clearance       with good visualization.      There is no endoscopic evidence of mass, polyps, ulcerations, colitis,       congestion, angioectasia or erosion in the entire colon.      The adult colonoscope was advanced to the cecum and withdrawn to the       rectum 3 times with careful 'to and fro' examination of the colonic       mucosa. No active bleeding could be identified from the diverticuli or       colonic mucosa.      Non-bleeding internal hemorrhoids were found  during retroflexion. The       hemorrhoids were Grade I (internal hemorrhoids that do not prolapse).      The exam was otherwise without abnormality. Impression:            - Preparation of the colon was fair.                        - The examined portion of the  ileum was normal.                        - Moderate diverticulosis in the entire examined                         colon. There was no evidence of diverticular bleeding.                         There was evidence of recent bleeding with clots and                         red blood in the colon.. I                        - Blood in the entire examined colon.                        - Non-bleeding internal hemorrhoids.                        - The examination was otherwise normal.                        - No specimens collected. Recommendation:        - Return patient to ICU until patient is stable.                        - NPO.                        - Serial H/H.                        Continue hemodynamic support.                        If resumes bleeding, will likely require an emergent                         CTA (despite CKD) and if positive, vascular/IR                         intervention.                        Following.                        - Hold anticoagulation as deemed medically feasible. Procedure Code(s):     --- Professional ---                        [id number], Colonoscopy, flexible; diagnostic, including  collection of specimen(s) by brushing or washing, when                         performed (separate procedure) Diagnosis Code(s):     --- Professional ---                        R93.3, Abnormal findings on diagnostic imaging of                         other parts of digestive tract                        K92.1, Melena (includes Hematochezia)                        K92.2, Gastrointestinal hemorrhage, unspecified                        K57.31, Diverticulosis of large intestine without                         perforation or abscess with bleeding                        K64.0, First degree hemorrhoids CPT copyright 2019 American Medical Association. All rights reserved. The codes documented in this report are preliminary and upon coder review may  be  revised to meet current compliance requirements. Efrain Sella MD, MD 07/22/2020 12:15:44 PM This report has been signed electronically. Number of Addenda: 0 Note Initiated On: 07/22/2020 7:53 AM Scope Withdrawal Time: 1 hour 2 minutes 25 seconds  Total Procedure Duration: 1 hour 14 minutes 35 seconds  Estimated Blood Loss:  Estimated blood loss was minimal.      [facility]

## 2020-07-22 NOTE — Progress Notes (Addendum)
PROGRESS NOTE    Hector [name]  [id number] DOB: October 25, 1965 DOA: 07/20/2020 PCP: [name], MD  Outpatient Specialists: [facility]    Brief Narrative:   Hector [name] is a 55 y.o. 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 fib not currently on anticoagulation, CKD 4,  GI bleed 2015, OSA, who was in his usual state of health until a few hours prior to arrival when he felt a sense of urgency and had a bowel movement that was dark blood.  He went on to have 2 additional episodes.  It was associated with abdominal cramping in the lower abdomen and one episode of nonbloody nonbilious vomiting..  After the last episode he felt lightheaded.  EMS reported SBP of 80 and administered 500 mL NS bolus in route. ED course: Upon arrival, afebrile, BP 119/73, pulse 68, pulse ox 100% on room air.  Hemoglobin 7.9, down from 12.2 a month prior, WBC 12,300, creatinine 2.77 which is at baseline, lipase 28.  Gross blood on rectal exam in the ER.   Assessment & Plan:   Principal Problem:   GIB (gastrointestinal bleeding) Active Problems:   Paroxysmal atrial fibrillation (HCC)   Chronic combined systolic (congestive) and diastolic (congestive) heart failure (HCC)   Nonischemic cardiomyopathy (HCC)   Obstructive sleep apnea   CKD (chronic kidney disease) stage 4, GFR 15-29 ml/min (HCC)   Acute blood loss anemia   Hematochezia/GI bleed with history of GI bleed 2015 Acute blood loss anemia, with hypotension Patient with mult episodes of bright red blood per rectum associated with mild abdominal pain, vomiting, lightheadedness, SBP 80s with EMS responding to fluid bolus. Hemoglobin 7.9 on presentation, down from 12.12 a month prior. Received 3 units 5/29. Tagged rbc scan shows rectal bleeding.  Overnight continued bleeding, no documentation of provider notification, lab did not come for AM labs. Stat labs obtained, bp systolic [id number], hgb 5.1.   - colonoscopy on hold - close vital sign monitoring - bolus 1 L NS - transfuse 3 units - cont h2 blocker (esomeprazole contraindication, appears caused interstitial nephritis) - npo - gi following - 2 PIVs  Paroxysmal atrial fibrillation (HCC) Pt reports hasn't taken home meds in months - resume home atorvastatin - hold home carvedilol and amiodarone for now - obviously holding eliquis which pt says hasn't taken for months  Chronic combined systolic and diastolic heart failure Nonischemic cardiomyopathy ([place]) - last EF 45-50% December 2021 (previously 20% 2009, 45% 2018) - Patient followed by Dr. Rockey Situ - not taking home coreg, lasix. Holding them for now while actively bleeding  Nonobstructive CAD - No complaints of chest pain, EKG nonacute - No antiplatelets.  Holding beta-blocker due to initial hypotension  Obstructive sleep apnea - CPAP has been recommended but not yet on it  CKD (chronic kidney disease) stage 4, GFR 15-29 ml/min (HCC) - Creatinine 2.77 which appears to be his baseline   DVT prophylaxis: scds Code Status: full Family Communication: mother updated telephonically 5/30  Level of care: Progressive Cardiac Status is: Inpatient  Remains inpatient appropriate because:Inpatient level of care appropriate due to severity of illness   Dispo: The patient is from: Home              Anticipated d/c is to: Home  Patient currently is not medically stable to d/c.   Difficult to place patient No    Consultants:  GI  Procedures: none  Antimicrobials:  none    Subjective: This morning says abd pain continues. Nursing says several bloody BMs overnight.   Objective: Vitals:   07/21/20 1920 07/22/20 0633 07/22/20 0656 07/22/20 0747  BP: 112/87 (!) 89/61 91/61 (!) 80/47  Pulse: 75 80  75   Resp: '14 14  16  '$ Temp: 97.8 F (36.6 C) 98.7 F (37.1 C)  97.8 F (36.6 C)  TempSrc: Oral   Oral  SpO2: 100% 100%  100%  Weight:      Height:        Intake/Output Summary (Last 24 hours) at 07/22/2020 0827 Last data filed at 07/22/2020 0500 Gross per 24 hour  Intake 1494.72 ml  Output 900 ml  Net 594.72 ml   Filed Weights   07/20/20 2337  Weight: 116 kg    Examination:  General exam: Appears calm and comfortable  Respiratory system: Clear to auscultation. Respiratory effort normal. Cardiovascular system: S1 & S2 heard, RRR. No JVD, murmurs, rubs, gallops or clicks. 1+ LE edema. Gastrointestinal system: Abdomen is obese, soft and with mild diffuse ttp, no rebound. No organomegaly or masses felt. Normal bowel sounds heard. Central nervous system: Alert and oriented. No focal neurological deficits. Extremities: Symmetric 5 x 5 power. Skin: No rashes, lesions or ulcers Psychiatry: Judgement and insight appear normal. Mood & affect appropriate.     Data Reviewed: I have personally reviewed following labs and imaging studies  CBC: Recent Labs  Lab 07/20/20 2343 07/21/20 0823 07/21/20 1822 07/22/20 0728  WBC 12.3* 7.8 10.4 13.0*  HGB 7.9* 7.0* 8.2* 5.1*  HCT 25.8* 22.4* 25.0* 15.6*  MCV 90.2 89.2 91.2 90.2  PLT 248 208 210  207 [id number]   Basic Metabolic Panel: Recent Labs  Lab 07/20/20 2343 07/22/20 0728  NA 139 138  K 4.1 5.2*  CL 111 113*  CO2 20* 18*  GLUCOSE 114* 129*  BUN 44* 41*  CREATININE 2.77* 2.54*  CALCIUM 8.2* 7.3*   GFR: Estimated Creatinine Clearance: 42.6 mL/min (A) (by C-G formula based on SCr of 2.54 mg/dL (H)). Liver Function Tests: Recent Labs  Lab 07/20/20 2343  AST 11*  ALT 5  ALKPHOS 45  BILITOT 0.8  PROT 5.5*  ALBUMIN 2.9*   Recent Labs  Lab 07/20/20 2343  LIPASE 28   No results for input(s): AMMONIA in the last 168 hours. Coagulation Profile: Recent Labs  Lab 07/21/20 0029 07/21/20 1822  INR 1.3* 1.4*   Cardiac  Enzymes: No results for input(s): CKTOTAL, CKMB, CKMBINDEX, TROPONINI in the last 168 hours. BNP (last 3 results) No results for input(s): PROBNP in the last 8760 hours. HbA1C: No results for input(s): HGBA1C in the last 72 hours. CBG: No results for input(s): GLUCAP in the last 168 hours. Lipid Profile: No results for input(s): CHOL, HDL, LDLCALC, TRIG, CHOLHDL, LDLDIRECT in the last 72 hours. Thyroid Function Tests: No results for input(s): TSH, T4TOTAL, FREET4, T3FREE, THYROIDAB in the last 72 hours. Anemia Panel: No results for input(s): VITAMINB12, FOLATE, FERRITIN, TIBC, IRON, RETICCTPCT in the last 72 hours. Urine analysis:    Component Value Date/Time   COLORURINE YELLOW (A) 07/20/2020 2343   APPEARANCEUR CLEAR (A) 07/20/2020 2343   APPEARANCEUR Clear 02/26/2014 1126   LABSPEC 1.016 07/20/2020 2343   LABSPEC 1.008 02/26/2014 1126   PHURINE 5.0 07/20/2020 [place] 07/20/2020 2343  GLUCOSEU Negative 02/26/2014 [place] 07/20/2020 2343   BILIRUBINUR NEGATIVE 07/20/2020 2343   BILIRUBINUR Negative 02/26/2014 [place] 07/20/2020 2343   PROTEINUR 100 (A) 07/20/2020 2343   NITRITE NEGATIVE 07/20/2020 2343   LEUKOCYTESUR TRACE (A) 07/20/2020 2343   LEUKOCYTESUR 1+ 02/26/2014 1126   Sepsis Labs: '@LABRCNTIP'$ (procalcitonin:4,lacticidven:4)  ) Recent Results (from the past 240 hour(s))  Resp Panel by RT-PCR (Flu A&B, Covid) Nasopharyngeal Swab     Status: None   Collection Time: 07/21/20 12:05 AM   Specimen: Nasopharyngeal Swab; Nasopharyngeal(NP) swabs in vial transport medium  Result Value Ref Range Status   SARS Coronavirus 2 by RT PCR NEGATIVE NEGATIVE Final    Comment: (NOTE) SARS-CoV-2 target nucleic acids are NOT DETECTED.  The SARS-CoV-2 RNA is generally detectable in upper respiratory specimens during the acute phase of infection. The lowest concentration of SARS-CoV-2 viral copies this assay can detect is 138  copies/mL. A negative result does not preclude SARS-Cov-2 infection and should not be used as the sole basis for treatment or other patient management decisions. A negative result may occur with  improper specimen collection/handling, submission of specimen other than nasopharyngeal swab, presence of viral mutation(s) within the areas targeted by this assay, and inadequate number of viral copies(<138 copies/mL). A negative result must be combined with clinical observations, patient history, and epidemiological information. The expected result is Negative.  Fact Sheet for Patients:  EntrepreneurPulse.com.au  Fact Sheet for Healthcare Providers:  IncredibleEmployment.be  This test is no t yet approved or cleared by the [place] FDA and  has been authorized for detection and/or diagnosis of SARS-CoV-2 by FDA under an Emergency Use Authorization (EUA). This EUA will remain  in effect (meaning this test can be used) for the duration of the COVID-19 declaration under Section 564(b)(1) of the Act, 21 U.S.C.section 360bbb-3(b)(1), unless the authorization is terminated  or revoked sooner.       Influenza A by PCR NEGATIVE NEGATIVE Final   Influenza B by PCR NEGATIVE NEGATIVE Final    Comment: (NOTE) The Xpert Xpress SARS-CoV-2/FLU/RSV plus assay is intended as an aid in the diagnosis of influenza from Nasopharyngeal swab specimens and should not be used as a sole basis for treatment. Nasal washings and aspirates are unacceptable for Xpert Xpress SARS-CoV-2/FLU/RSV testing.  Fact Sheet for Patients: EntrepreneurPulse.com.au  Fact Sheet for Healthcare Providers: IncredibleEmployment.be  This test is not yet approved or cleared by the [place] FDA and has been authorized for detection and/or diagnosis of SARS-CoV-2 by FDA under an Emergency Use Authorization (EUA). This EUA will remain in effect (meaning  this test can be used) for the duration of the COVID-19 declaration under Section 564(b)(1) of the Act, 21 U.S.C. section 360bbb-3(b)(1), unless the authorization is terminated or revoked.  Performed at [facility], [address], [place], [place] [address]          Radiology Studies: NM GI Blood Loss  Result Date: 07/21/2020 CLINICAL DATA:  Bright red blood per rectum EXAM: NUCLEAR MEDICINE GASTROINTESTINAL BLEEDING SCAN TECHNIQUE: Sequential abdominal images were obtained following intravenous administration of Tc-59mlabeled red blood cells. RADIOPHARMACEUTICALS:  19.14 mCi Tc-969mertechnetate in-vitro labeled red cells. COMPARISON:  None. FINDINGS: Adequate uptake in the blood pool is noted. Vascular structures show in a normal appearance. Linear area of increased radiotracer is noted which does not correspond to the bladder and is felt to represent rectal hemorrhage. IMPRESSION: Positive GI hemorrhage within the rectum. Electronically Signed  By: Inez Catalina M.D.   On: 07/21/2020 16:50        Scheduled Meds: . sodium chloride   Intravenous Once  . sodium chloride   Intravenous Once  . sodium chloride   Intravenous Once  . atorvastatin  80 mg Oral Daily   Continuous Infusions: . sodium chloride 100 mL/hr at 07/21/20 0004  . sodium chloride    . sodium chloride 50 mL/hr at 07/22/20 0446  . famotidine (PEPCID) IV 20 mg (07/22/20 0113)     LOS: 1 day    Time spent: 31 min    Desma Maxim, MD [facility]   If 7PM-7AM, please contact night-coverage [email or web address] Password [facility] 07/22/2020, 8:27 AM

## 2020-07-22 NOTE — Anesthesia Postprocedure Evaluation (Signed)
Anesthesia Post Note  Patient: Hector [name]  Procedure(s) Performed: COLONOSCOPY (N/A )  Patient location during evaluation: PACU Anesthesia Type: General Level of consciousness: awake and alert Pain management: pain level controlled Vital Signs Assessment: post-procedure vital signs reviewed and stable Respiratory status: spontaneous breathing, nonlabored ventilation, respiratory function stable and patient connected to nasal cannula oxygen Cardiovascular status: blood pressure returned to baseline and stable Postop Assessment: no apparent nausea or vomiting Anesthetic complications: no   No complications documented.   Last Vitals:  Vitals:   07/22/20 1218 07/22/20 1257  BP: 111/76 (!) 128/98  Pulse: 68 67  Resp: 18 20  Temp:  36.7 C  SpO2: 98% 100%    Last Pain:  Vitals:   07/22/20 1218  TempSrc:   PainSc: 0-No pain                 [name]

## 2020-07-22 NOTE — Progress Notes (Signed)
Date and time results received: 07/22/20 0815  Test: hemoglobin    Critical Value: hgb 5.1   Name of Provider Notified:Dr. Si Raider   New orders received

## 2020-07-22 NOTE — Progress Notes (Signed)
Holding in PACU patient identified. Blood hanging patient stable.

## 2020-07-23 ENCOUNTER — Encounter: Payer: Self-pay | Admitting: Internal Medicine

## 2020-07-23 DIAGNOSIS — K922 Gastrointestinal hemorrhage, unspecified: Secondary | ICD-10-CM | POA: Diagnosis not present

## 2020-07-23 LAB — CBC
HCT: 18.5 % — ABNORMAL LOW (ref 39.0–52.0)
HCT: 21 % — ABNORMAL LOW (ref 39.0–52.0)
HCT: 20.7 % — ABNORMAL LOW (ref 39.0–52.0)
Hemoglobin: 6.1 g/dL — ABNORMAL LOW (ref 13.0–17.0)
Hemoglobin: 7.1 g/dL — ABNORMAL LOW (ref 13.0–17.0)
Hemoglobin: 6.8 g/dL — ABNORMAL LOW (ref 13.0–17.0)
MCH: 30 pg (ref 26.0–34.0)
MCH: 30.7 pg (ref 26.0–34.0)
MCH: 30.2 pg (ref 26.0–34.0)
MCHC: 33 g/dL (ref 30.0–36.0)
MCHC: 33.8 g/dL (ref 30.0–36.0)
MCHC: 32.9 g/dL (ref 30.0–36.0)
MCV: 91.1 fL (ref 80.0–100.0)
MCV: 90.9 fL (ref 80.0–100.0)
MCV: 92 fL (ref 80.0–100.0)
Platelets: 155 10*3/uL (ref 150–400)
Platelets: 157 10*3/uL (ref 150–400)
Platelets: 168 10*3/uL (ref 150–400)
RBC: 2.03 MIL/uL — ABNORMAL LOW (ref 4.22–5.81)
RBC: 2.31 MIL/uL — ABNORMAL LOW (ref 4.22–5.81)
RBC: 2.25 MIL/uL — ABNORMAL LOW (ref 4.22–5.81)
RDW: 16.6 % — ABNORMAL HIGH (ref 11.5–15.5)
RDW: 16.4 % — ABNORMAL HIGH (ref 11.5–15.5)
RDW: 17 % — ABNORMAL HIGH (ref 11.5–15.5)
WBC: 12.4 10*3/uL — ABNORMAL HIGH (ref 4.0–10.5)
WBC: 11.3 10*3/uL — ABNORMAL HIGH (ref 4.0–10.5)
WBC: 10.7 10*3/uL — ABNORMAL HIGH (ref 4.0–10.5)
nRBC: 0.2 % (ref 0.0–0.2)
nRBC: 0.4 % — ABNORMAL HIGH (ref 0.0–0.2)
nRBC: 0.2 % (ref 0.0–0.2)

## 2020-07-23 LAB — PREPARE RBC (CROSSMATCH)

## 2020-07-23 LAB — BASIC METABOLIC PANEL
Anion gap: 4 — ABNORMAL LOW (ref 5–15)
BUN: 35 mg/dL — ABNORMAL HIGH (ref 6–20)
CO2: 20 mmol/L — ABNORMAL LOW (ref 22–32)
Calcium: 7.4 mg/dL — ABNORMAL LOW (ref 8.9–10.3)
Chloride: 113 mmol/L — ABNORMAL HIGH (ref 98–111)
Creatinine, Ser: 2.68 mg/dL — ABNORMAL HIGH (ref 0.61–1.24)
GFR, Estimated: 27 mL/min — ABNORMAL LOW (ref >60)
Glucose, Bld: 98 mg/dL (ref 70–99)
Potassium: 4.6 mmol/L (ref 3.5–5.1)
Sodium: 137 mmol/L (ref 135–145)

## 2020-07-23 MED ORDER — SODIUM CHLORIDE 0.9% IV SOLUTION: Freq: Once | INTRAVENOUS | Status: DC

## 2020-07-23 MED ORDER — SODIUM CHLORIDE 0.9% IV SOLUTION: Freq: Once | INTRAVENOUS | Status: AC

## 2020-07-23 NOTE — Progress Notes (Signed)
Cross coverage  Hemoglobin 6.8.  Patient refusing another blood transfusion.  Previous hemoglobin was 7.1 representing a change of 0.3 g/dL.  Patient hemodynamically stable and has not had any noted rectal bleeding since early in the a.m.  Ongoing blood loss anemia - Continue to monitor H&H every 6 and if evidence of further downward trend will again approach patient to accept the recommendation for blood transfusion - If patient has a bleed during the night or vitals should become unstable we will again reinforced the need for transfusion - Continue to monitor hemodynamics

## 2020-07-23 NOTE — Plan of Care (Signed)
  Problem: Health Behavior/Discharge Planning: Goal: Ability to manage health-related needs will improve Outcome: Progressing   Problem: Clinical Measurements: Goal: Ability to maintain clinical measurements within normal limits will improve Outcome: Progressing Goal: Will remain free from infection Outcome: Progressing Goal: Diagnostic test results will improve Outcome: Progressing Goal: Respiratory complications will improve Outcome: Progressing Goal: Cardiovascular complication will be avoided Outcome: Progressing   Problem: Activity: Goal: Risk for activity intolerance will decrease Outcome: Progressing   Problem: Pain Managment: Goal: General experience of comfort will improve Outcome: Progressing   Problem: Safety: Goal: Ability to remain free from injury will improve Outcome: Progressing   Problem: Education: Goal: Ability to identify signs and symptoms of gastrointestinal bleeding will improve Outcome: Progressing   Problem: Bowel/Gastric: Goal: Will show no signs and symptoms of gastrointestinal bleeding Outcome: Progressing   Problem: Fluid Volume: Goal: Will show no signs and symptoms of excessive bleeding Outcome: Progressing   Problem: Clinical Measurements: Goal: Complications related to the disease process, condition or treatment will be avoided or minimized Outcome: Progressing

## 2020-07-23 NOTE — Progress Notes (Addendum)
PROGRESS NOTE    Hector [name]  [id number] DOB: 1966-01-19 DOA: 07/20/2020 PCP: [name], MD  Outpatient Specialists: [facility]    Brief Narrative:   Hector [name] is a 55 y.o. 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 fib not currently on anticoagulation, CKD 4,  GI bleed 2015, OSA, who was in his usual state of health until a few hours prior to arrival when he felt a sense of urgency and had a bowel movement that was dark blood.  He went on to have 2 additional episodes.  It was associated with abdominal cramping in the lower abdomen and one episode of nonbloody nonbilious vomiting..  After the last episode he felt lightheaded.  EMS reported SBP of 80 and administered 500 mL NS bolus in route. ED course: Upon arrival, afebrile, BP 119/73, pulse 68, pulse ox 100% on room air.  Hemoglobin 7.9, down from 12.2 a month prior, WBC 12,300, creatinine 2.77 which is at baseline, lipase 28.  Gross blood on rectal exam in the ER.   Assessment & Plan:   Principal Problem:   GIB (gastrointestinal bleeding) Active Problems:   Paroxysmal atrial fibrillation (HCC)   Chronic combined systolic (congestive) and diastolic (congestive) heart failure (HCC)   Nonischemic cardiomyopathy (HCC)   Obstructive sleep apnea   CKD (chronic kidney disease) stage 4, GFR 15-29 ml/min (HCC)   Acute blood loss anemia   Hematochezia/GI bleed with history of GI bleed 2015 Acute blood loss anemia, with hypotension Patient with mult episodes of bright red blood per rectum associated with mild abdominal pain, vomiting, lightheadedness, SBP 80s with EMS responding to fluid bolus. Hemoglobin 7.9 on presentation, down from 12.12 a month prior. Received 3 units 5/29 and another 3 units on 5/30. Last bleed  was AM on 5/30. Tagged rbc scan shows rectal bleeding. Colonoscopy 5/30 failed to reveal source of bleed. This morning patient feeling much better, abd pain resolved, no bleeding. Hgb 6.1 - transfuse 1 unit - cont h2 blocker (esomeprazole contraindication, appears caused interstitial nephritis) - npo sips w/ meds - gi following - 2 PIVs - CTA if more bleeding  Addendum 5/31 19:30 hgb 6.8. no bleeding today. - will transfuse an additional 1 unit  Paroxysmal atrial fibrillation ([place]) Pt reports hasn't taken home meds in months - resumed home atorvastatin - hold home carvedilol and amiodarone for now - holding eliquis which pt says hasn't taken for months  Chronic combined systolic and diastolic heart failure Nonischemic cardiomyopathy ([place]) - last EF 45-50% December 2021 (previously 20% 2009, 45% 2018) - Patient followed by Dr. Rockey Situ - not taking home coreg, lasix. Holding them for now while actively bleeding  Nonobstructive CAD - No complaints of chest pain, EKG nonacute - No antiplatelets.  Holding beta-blocker due to initial hypotension  Obstructive sleep apnea - CPAP has been recommended but not yet on it  CKD (chronic kidney disease) stage 4, GFR 15-29 ml/min (HCC) - Creatinine upper 2s which appears to be his baseline   DVT prophylaxis: scds Code Status: full Family Communication: mother updated telephonically 5/31  Level of care: Progressive Cardiac Status is: Inpatient  Remains inpatient appropriate because:Inpatient level of care appropriate due to severity of illness   Dispo: The patient is from: Home  Anticipated d/c is to: Home              Patient currently is not medically stable to d/c.   Difficult to place patient No    Consultants:  GI  Procedures: none  Antimicrobials:  none    Subjective: This morning says abd pain resolved, feeling much better. No bleeding.   Objective: Vitals:   07/23/20 0727 07/23/20 0847  07/23/20 0910 07/23/20 0921  BP: 109/70 132/78 128/81 123/90  Pulse: 80 80 77 83  Resp: '18 19  18  '$ Temp: 98.3 F (36.8 C) 98.3 F (36.8 C) 98.3 F (36.8 C) 98.4 F (36.9 C)  TempSrc:  Oral Oral Oral  SpO2: 97% 98% 98% 97%  Weight:      Height:        Intake/Output Summary (Last 24 hours) at 07/23/2020 1035 Last data filed at 07/23/2020 0600 Gross per 24 hour  Intake 2875.17 ml  Output 575 ml  Net 2300.17 ml   Filed Weights   07/20/20 2337 07/22/20 1300  Weight: 116 kg 115.2 kg    Examination:  General exam: Appears calm and comfortable  Respiratory system: Clear to auscultation. Respiratory effort normal. Cardiovascular system: S1 & S2 heard, RRR. No JVD, murmurs, rubs, gallops or clicks. 1+ LE edema. Gastrointestinal system: Abdomen is obese, non-tender, no rebound. No organomegaly or masses felt. Normal bowel sounds heard. Central nervous system: Alert and oriented. No focal neurological deficits. Extremities: Symmetric 5 x 5 power. Skin: No rashes, lesions or ulcers Psychiatry: Judgement and insight appear normal. Mood & affect appropriate.     Data Reviewed: I have personally reviewed following labs and imaging studies  CBC: Recent Labs  Lab 07/22/20 0728 07/22/20 1339 07/22/20 1952 07/22/20 2322 07/23/20 0611  WBC 13.0* 16.1* 16.8* 14.4* 12.4*  HGB 5.1* 8.7* 7.6* 6.6* 6.1*  HCT 15.6* 25.9* 22.2* 19.5* 18.5*  MCV 90.2 91.5 89.5 90.3 91.1  PLT 191 168 155 154 [id number]   Basic Metabolic Panel: Recent Labs  Lab 07/20/20 2343 07/22/20 0728 07/23/20 0611  NA 139 138 137  K 4.1 5.2* 4.6  CL 111 113* 113*  CO2 20* 18* 20*  GLUCOSE 114* 129* 98  BUN 44* 41* 35*  CREATININE 2.77* 2.54* 2.68*  CALCIUM 8.2* 7.3* 7.4*   GFR: Estimated Creatinine Clearance: 40.2 mL/min (A) (by C-G formula based on SCr of 2.68 mg/dL (H)). Liver Function Tests: Recent Labs  Lab 07/20/20 2343  AST 11*  ALT 5  ALKPHOS 45  BILITOT 0.8  PROT 5.5*  ALBUMIN 2.9*   Recent  Labs  Lab 07/20/20 2343  LIPASE 28   No results for input(s): AMMONIA in the last 168 hours. Coagulation Profile: Recent Labs  Lab 07/21/20 0029 07/21/20 1822  INR 1.3* 1.4*   Cardiac Enzymes: No results for input(s): CKTOTAL, CKMB, CKMBINDEX, TROPONINI in the last 168 hours. BNP (last 3 results) No results for input(s): PROBNP in the last 8760 hours. HbA1C: No results for input(s): HGBA1C in the last 72 hours. CBG: No results for input(s): GLUCAP in the last 168 hours. Lipid Profile: No results for input(s): CHOL, HDL, LDLCALC, TRIG, CHOLHDL, LDLDIRECT in the last 72 hours. Thyroid Function Tests: No results for input(s): TSH, T4TOTAL, FREET4, T3FREE, THYROIDAB in the last 72 hours. Anemia Panel: No results for input(s): VITAMINB12, FOLATE, FERRITIN, TIBC, IRON, RETICCTPCT in the last 72 hours. Urine analysis:    Component Value Date/Time   COLORURINE YELLOW (A) 07/20/2020 2343   APPEARANCEUR CLEAR (A)  07/20/2020 2343   APPEARANCEUR Clear 02/26/2014 1126   LABSPEC 1.016 07/20/2020 2343   LABSPEC 1.008 02/26/2014 1126   PHURINE 5.0 07/20/2020 [place] 07/20/2020 2343   GLUCOSEU Negative 02/26/2014 [place] 07/20/2020 2343   [place] 07/20/2020 2343   BILIRUBINUR Negative 02/26/2014 [place] 07/20/2020 2343   PROTEINUR 100 (A) 07/20/2020 2343   NITRITE NEGATIVE 07/20/2020 2343   LEUKOCYTESUR TRACE (A) 07/20/2020 2343   LEUKOCYTESUR 1+ 02/26/2014 1126   Sepsis Labs: '@LABRCNTIP'$ (procalcitonin:4,lacticidven:4)  ) Recent Results (from the past 240 hour(s))  Resp Panel by RT-PCR (Flu A&B, Covid) Nasopharyngeal Swab     Status: None   Collection Time: 07/21/20 12:05 AM   Specimen: Nasopharyngeal Swab; Nasopharyngeal(NP) swabs in vial transport medium  Result Value Ref Range Status   SARS Coronavirus 2 by RT PCR NEGATIVE NEGATIVE Final    Comment: (NOTE) SARS-CoV-2 target nucleic acids are NOT DETECTED.  The  SARS-CoV-2 RNA is generally detectable in upper respiratory specimens during the acute phase of infection. The lowest concentration of SARS-CoV-2 viral copies this assay can detect is 138 copies/mL. A negative result does not preclude SARS-Cov-2 infection and should not be used as the sole basis for treatment or other patient management decisions. A negative result may occur with  improper specimen collection/handling, submission of specimen other than nasopharyngeal swab, presence of viral mutation(s) within the areas targeted by this assay, and inadequate number of viral copies(<138 copies/mL). A negative result must be combined with clinical observations, patient history, and epidemiological information. The expected result is Negative.  Fact Sheet for Patients:  EntrepreneurPulse.com.au  Fact Sheet for Healthcare Providers:  IncredibleEmployment.be  This test is no t yet approved or cleared by the [place] FDA and  has been authorized for detection and/or diagnosis of SARS-CoV-2 by FDA under an Emergency Use Authorization (EUA). This EUA will remain  in effect (meaning this test can be used) for the duration of the COVID-19 declaration under Section 564(b)(1) of the Act, 21 U.S.C.section 360bbb-3(b)(1), unless the authorization is terminated  or revoked sooner.       Influenza A by PCR NEGATIVE NEGATIVE Final   Influenza B by PCR NEGATIVE NEGATIVE Final    Comment: (NOTE) The Xpert Xpress SARS-CoV-2/FLU/RSV plus assay is intended as an aid in the diagnosis of influenza from Nasopharyngeal swab specimens and should not be used as a sole basis for treatment. Nasal washings and aspirates are unacceptable for Xpert Xpress SARS-CoV-2/FLU/RSV testing.  Fact Sheet for Patients: EntrepreneurPulse.com.au  Fact Sheet for Healthcare Providers: IncredibleEmployment.be  This test is not yet approved or  cleared by the [place] FDA and has been authorized for detection and/or diagnosis of SARS-CoV-2 by FDA under an Emergency Use Authorization (EUA). This EUA will remain in effect (meaning this test can be used) for the duration of the COVID-19 declaration under Section 564(b)(1) of the Act, 21 U.S.C. section 360bbb-3(b)(1), unless the authorization is terminated or revoked.  Performed at [facility], [address], [place], [place] [address]          Radiology Studies: NM GI Blood Loss  Result Date: 07/21/2020 CLINICAL DATA:  Bright red blood per rectum EXAM: NUCLEAR MEDICINE GASTROINTESTINAL BLEEDING SCAN TECHNIQUE: Sequential abdominal images were obtained following intravenous administration of Tc-39mlabeled red blood cells. RADIOPHARMACEUTICALS:  19.14 mCi Tc-979mertechnetate in-vitro labeled red cells. COMPARISON:  None. FINDINGS: Adequate uptake in the blood pool is noted. Vascular structures show in  a normal appearance. Linear area of increased radiotracer is noted which does not correspond to the bladder and is felt to represent rectal hemorrhage. IMPRESSION: Positive GI hemorrhage within the rectum. Electronically Signed   By: Inez Catalina M.D.   On: 07/21/2020 16:50        Scheduled Meds: . sodium chloride   Intravenous Once  . sodium chloride   Intravenous Once  . sodium chloride   Intravenous Once  . atorvastatin  80 mg Oral Daily   Continuous Infusions: . sodium chloride    . sodium chloride 50 mL/hr at 07/22/20 1205  . famotidine (PEPCID) IV 20 mg (07/23/20 0053)     LOS: 2 days    Time spent: 30 min    Desma Maxim, MD [facility]   If 7PM-7AM, please contact night-coverage [email or web address] Password [facility] 07/23/2020, 10:35 AM

## 2020-07-23 NOTE — Progress Notes (Signed)
[facility] Gastroenterology Inpatient Progress Note  Subjective: Patient seen for follow up of lower gastrointestinal bleeding, likely diverticular in origin.No further signs of symptoms of bleeding since procedure yesterday (see operative report). Denies pain. Somewhat agitated.  Objective: Vital signs in last 24 hours: Temp:  [97.9 F (36.6 C)-99.2 F (37.3 C)] 97.9 F (36.6 C) (05/31 1547) Pulse Rate:  [71-83] 71 (05/31 1547) Resp:  [17-19] 17 (05/31 1547) BP: (102-132)/(66-90) 127/84 (05/31 1547) SpO2:  [94 %-100 %] 100 % (05/31 1547) Blood pressure 127/84, pulse 71, temperature 97.9 F (36.6 C), temperature source Oral, resp. rate 17, height '5\' 11"'$  (1.803 m), weight 115.2 kg, SpO2 100 %.    Intake/Output from previous day: 05/30 0701 - 05/31 0700 In: 3141.8 [P.O.:60; I.V.:1765.2; Blood:1316.6] Out: 675 [Urine:675]  Intake/Output this shift: Total I/O In: 940.1 [I.V.:632.1; Blood:308] Out: 500 [Urine:500]   General appearance:  Somnolent, NAD Resp:  CTA  Cardio:  RRR GI:  Soft, nt. BS+ Extremities: 1+ edema.   Lab Results: Results for orders placed or performed during the hospital encounter of 07/20/20 (from the past 24 hour(s))  CBC     Status: Abnormal   Collection Time: 07/22/20  7:52 PM  Result Value Ref Range   WBC 16.8 (H) 4.0 - 10.5 K/uL   RBC 2.48 (L) 4.22 - 5.81 MIL/uL   Hemoglobin 7.6 (L) 13.0 - 17.0 g/dL   HCT 22.2 (L) 39.0 - 52.0 %   MCV 89.5 80.0 - 100.0 fL   MCH 30.6 26.0 - 34.0 pg   MCHC 34.2 30.0 - 36.0 g/dL   RDW 16.1 (H) 11.5 - 15.5 %   Platelets 155 150 - 400 K/uL   nRBC 0.1 0.0 - 0.2 %  CBC     Status: Abnormal   Collection Time: 07/22/20 11:22 PM  Result Value Ref Range   WBC 14.4 (H) 4.0 - 10.5 K/uL   RBC 2.16 (L) 4.22 - 5.81 MIL/uL   Hemoglobin 6.6 (L) 13.0 - 17.0 g/dL   HCT 19.5 (L) 39.0 - 52.0 %   MCV 90.3 80.0 - 100.0 fL   MCH 30.6 26.0 - 34.0 pg   MCHC 33.8 30.0 - 36.0 g/dL   RDW 16.3 (H) 11.5 - 15.5 %   Platelets 154  150 - 400 K/uL   nRBC 0.1 0.0 - 0.2 %  Basic metabolic panel     Status: Abnormal   Collection Time: 07/23/20  6:11 AM  Result Value Ref Range   Sodium 137 135 - 145 mmol/L   Potassium 4.6 3.5 - 5.1 mmol/L   Chloride 113 (H) 98 - 111 mmol/L   CO2 20 (L) 22 - 32 mmol/L   Glucose, Bld 98 70 - 99 mg/dL   BUN 35 (H) 6 - 20 mg/dL   Creatinine, Ser 2.68 (H) 0.61 - 1.24 mg/dL   Calcium 7.4 (L) 8.9 - 10.3 mg/dL   GFR, Estimated 27 (L) >60 mL/min   Anion gap 4 (L) 5 - 15  CBC     Status: Abnormal   Collection Time: 07/23/20  6:11 AM  Result Value Ref Range   WBC 12.4 (H) 4.0 - 10.5 K/uL   RBC 2.03 (L) 4.22 - 5.81 MIL/uL   Hemoglobin 6.1 (L) 13.0 - 17.0 g/dL   HCT 18.5 (L) 39.0 - 52.0 %   MCV 91.1 80.0 - 100.0 fL   MCH 30.0 26.0 - 34.0 pg   MCHC 33.0 30.0 - 36.0 g/dL   RDW 16.6 (H) 11.5 -  15.5 %   Platelets 155 150 - 400 K/uL   nRBC 0.2 0.0 - 0.2 %  Prepare RBC (crossmatch)     Status: None   Collection Time: 07/23/20  7:25 AM  Result Value Ref Range   Order Confirmation      ORDER PROCESSED BY BLOOD BANK Performed at [facility], [address] [place], [place] [address]   CBC     Status: Abnormal   Collection Time: 07/23/20 12:19 PM  Result Value Ref Range   WBC 11.3 (H) 4.0 - 10.5 K/uL   RBC 2.31 (L) 4.22 - 5.81 MIL/uL   Hemoglobin 7.1 (L) 13.0 - 17.0 g/dL   HCT 21.0 (L) 39.0 - 52.0 %   MCV 90.9 80.0 - 100.0 fL   MCH 30.7 26.0 - 34.0 pg   MCHC 33.8 30.0 - 36.0 g/dL   RDW 16.4 (H) 11.5 - 15.5 %   Platelets 157 150 - 400 K/uL   nRBC 0.4 (H) 0.0 - 0.2 %     Recent Labs    07/22/20 2322 07/23/20 0611 07/23/20 1219  WBC 14.4* 12.4* 11.3*  HGB 6.6* 6.1* 7.1*  HCT 19.5* 18.5* 21.0*  PLT 154 155 157   BMET Recent Labs    07/20/20 2343 07/22/20 0728 07/23/20 0611  NA 139 138 137  K 4.1 5.2* 4.6  CL 111 113* 113*  CO2 20* 18* 20*  GLUCOSE 114* 129* 98  BUN 44* 41* 35*  CREATININE 2.77* 2.54* 2.68*  CALCIUM 8.2* 7.3* 7.4*   LFT Recent Labs     07/20/20 2343  PROT 5.5*  ALBUMIN 2.9*  AST 11*  ALT 5  ALKPHOS 45  BILITOT 0.8   PT/INR Recent Labs    07/21/20 0029 07/21/20 1822  LABPROT 16.5* 16.7*  INR 1.3* 1.4*   Hepatitis Panel No results for input(s): HEPBSAG, HCVAB, HEPAIGM, HEPBIGM in the last 72 hours. C-Diff No results for input(s): CDIFFTOX in the last 72 hours. No results for input(s): CDIFFPCR in the last 72 hours.   Studies/Results: No results found.  Scheduled Inpatient Medications:   . sodium chloride   Intravenous Once  . sodium chloride   Intravenous Once  . sodium chloride   Intravenous Once  . atorvastatin  80 mg Oral Daily    Continuous Inpatient Infusions:   . sodium chloride    . sodium chloride 75 mL/hr at 07/23/20 1043  . famotidine (PEPCID) IV 20 mg (07/23/20 0053)    PRN Inpatient Medications:  acetaminophen **OR** acetaminophen, HYDROcodone-acetaminophen, morphine injection, ondansetron **OR** ondansetron (ZOFRAN) IV    Assessment:  1. Probable resolved diverticular bleeding. Hemodynamically stable. 2. Drop in Hgb, but now stable at 7.1. 3. Paroxysmal Atrial fibrillation, stable. 4. Chronic anticoagulation therapy - Held by patient without MD approval for the past few months.  Plan:   1. Advance diet as tolerated. 2. May resume anticoagulation if deemed necessary per cardiology and agreeable to the patient. 3. GI sign off for now. Call back if we can help.   Naliyah Neth K. Alice Reichert, M.D. 07/23/2020, 4:57 PM

## 2020-07-23 NOTE — Progress Notes (Signed)
Patient refusing blood transfusion as per order, educated him and told him that his HGB is 6.8 and MD was notified about his refusal for blood transfusion

## 2020-07-24 DIAGNOSIS — N184 Chronic kidney disease, stage 4 (severe): Secondary | ICD-10-CM

## 2020-07-24 DIAGNOSIS — D62 Acute posthemorrhagic anemia: Secondary | ICD-10-CM | POA: Diagnosis not present

## 2020-07-24 DIAGNOSIS — I5042 Chronic combined systolic (congestive) and diastolic (congestive) heart failure: Secondary | ICD-10-CM | POA: Diagnosis not present

## 2020-07-24 DIAGNOSIS — K922 Gastrointestinal hemorrhage, unspecified: Secondary | ICD-10-CM | POA: Diagnosis not present

## 2020-07-24 LAB — CBC
HCT: 20.9 % — ABNORMAL LOW (ref 39.0–52.0)
HCT: 21.7 % — ABNORMAL LOW (ref 39.0–52.0)
HCT: 22.3 % — ABNORMAL LOW (ref 39.0–52.0)
Hemoglobin: 6.9 g/dL — ABNORMAL LOW (ref 13.0–17.0)
Hemoglobin: 7 g/dL — ABNORMAL LOW (ref 13.0–17.0)
Hemoglobin: 7.2 g/dL — ABNORMAL LOW (ref 13.0–17.0)
MCH: 30.3 pg (ref 26.0–34.0)
MCH: 30.3 pg (ref 26.0–34.0)
MCH: 30.4 pg (ref 26.0–34.0)
MCHC: 33 g/dL (ref 30.0–36.0)
MCHC: 32.3 g/dL (ref 30.0–36.0)
MCHC: 32.3 g/dL (ref 30.0–36.0)
MCV: 91.7 fL (ref 80.0–100.0)
MCV: 93.9 fL (ref 80.0–100.0)
MCV: 94.1 fL (ref 80.0–100.0)
Platelets: 175 10*3/uL (ref 150–400)
Platelets: 186 10*3/uL (ref 150–400)
Platelets: 173 10*3/uL (ref 150–400)
RBC: 2.28 MIL/uL — ABNORMAL LOW (ref 4.22–5.81)
RBC: 2.31 MIL/uL — ABNORMAL LOW (ref 4.22–5.81)
RBC: 2.37 MIL/uL — ABNORMAL LOW (ref 4.22–5.81)
RDW: 17.2 % — ABNORMAL HIGH (ref 11.5–15.5)
RDW: 17.2 % — ABNORMAL HIGH (ref 11.5–15.5)
RDW: 17.3 % — ABNORMAL HIGH (ref 11.5–15.5)
WBC: 9.4 10*3/uL (ref 4.0–10.5)
WBC: 10.2 10*3/uL (ref 4.0–10.5)
WBC: 10.5 10*3/uL (ref 4.0–10.5)
nRBC: 0 % (ref 0.0–0.2)
nRBC: 0.2 % (ref 0.0–0.2)
nRBC: 0 % (ref 0.0–0.2)

## 2020-07-24 LAB — BASIC METABOLIC PANEL
Anion gap: 6 (ref 5–15)
BUN: 27 mg/dL — ABNORMAL HIGH (ref 6–20)
CO2: 20 mmol/L — ABNORMAL LOW (ref 22–32)
Calcium: 7.8 mg/dL — ABNORMAL LOW (ref 8.9–10.3)
Chloride: 111 mmol/L (ref 98–111)
Creatinine, Ser: 2.46 mg/dL — ABNORMAL HIGH (ref 0.61–1.24)
GFR, Estimated: 30 mL/min — ABNORMAL LOW (ref >60)
Glucose, Bld: 91 mg/dL (ref 70–99)
Potassium: 4.1 mmol/L (ref 3.5–5.1)
Sodium: 137 mmol/L (ref 135–145)

## 2020-07-24 NOTE — Progress Notes (Signed)
Per lab, patient is refusing lab draw. Unable to check H & H. Md notified.

## 2020-07-24 NOTE — Progress Notes (Signed)
PROGRESS NOTE    Hector [name]  [id number] DOB: 1965/06/08 DOA: 07/20/2020 PCP: [name], MD  Outpatient Specialists: [facility]    Brief Narrative:   Hector [name] is a 55 y.o. 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 fib not currently on anticoagulation, CKD 4,  GI bleed 2015, OSA, who was in his usual state of health until a few hours prior to arrival when he felt a sense of urgency and had a bowel movement that was dark blood.  He went on to have 2 additional episodes.  It was associated with abdominal cramping in the lower abdomen and one episode of nonbloody nonbilious vomiting..  After the last episode he felt lightheaded.  EMS reported SBP of 80 and administered 500 mL NS bolus in route. ED course: Upon arrival, afebrile, BP 119/73, pulse 68, pulse ox 100% on room air.  Hemoglobin 7.9, down from 12.2 a month prior, WBC 12,300, creatinine 2.77 which is at baseline, lipase 28.  Gross blood on rectal exam in the ER.   Assessment & Plan:   Principal Problem:   GIB (gastrointestinal bleeding) Active Problems:   Paroxysmal atrial fibrillation (HCC)   Chronic combined systolic (congestive) and diastolic (congestive) heart failure (HCC)   Nonischemic cardiomyopathy (HCC)   Obstructive sleep apnea   CKD (chronic kidney disease) stage 4, GFR 15-29 ml/min (HCC)   Acute blood loss anemia   Hematochezia/GI bleed with history of GI bleed 2015 Acute blood loss anemia, with hypotension Patient with mult episodes of bright red blood per rectum associated with mild abdominal pain, vomiting, lightheadedness, SBP 80s with EMS responding to fluid bolus. Hemoglobin 7.9 on presentation, down from 12.12 a month prior. Received 3 units 5/29 and another 3 units on 5/30.  1 more  unit on 5/31 last bleed was AM on 5/30. Tagged rbc scan shows rectal bleeding. Colonoscopy 5/30 failed to reveal source of bleed. This morning patient feeling much better, abd pain resolved, no bleeding. Hgb 6.9 -Patient refusing any further transfusion - cont h2 blocker (esomeprazole contraindication, appears caused interstitial nephritis) -Start regular diet - gi signed off -No further bleeding  Paroxysmal atrial fibrillation ([place]) Pt reports hasn't taken home meds in months - resumed home atorvastatin - hold home carvedilol and amiodarone for now - holding eliquis which pt says hasn't taken for months  Chronic combined systolic and diastolic heart failure Nonischemic cardiomyopathy ([place]) - last EF 45-50% December 2021 (previously 20% 2009, 45% 2018) - Patient followed by Dr. Rockey Situ - not taking home coreg, lasix. Holding them for now while actively bleeding  Nonobstructive CAD - No complaints of chest pain, EKG nonacute - No antiplatelets.  Holding beta-blocker due to initial hypotension  Obstructive sleep apnea - CPAP has been recommended but not yet on it  CKD (chronic kidney disease) stage 4, GFR 15-29 ml/min (HCC) - Creatinine upper 2s which appears to be his baseline   DVT prophylaxis: scds Code Status: full Family Communication: mother updated on 5/31 by Dr. Si Raider  Level of care: Progressive Cardiac Status is: Inpatient  Remains inpatient appropriate because:Inpatient level of care appropriate due to severity of illness   Dispo: The patient is from: Home              Anticipated d/c is to: Home  Patient currently is not medically stable to d/c.   Difficult to place patient No    Consultants:  GI    Subjective: Patient refusing any blood transfusion at this point.  He is requesting diet.  Denies any further bleeding  Objective: Vitals:   07/24/20 0539 07/24/20 0738 07/24/20 1110 07/24/20 1601  BP: (!) 136/96 (!) 142/101 (!) 140/96 (!)  128/98  Pulse: 80 77 75 77  Resp: '20 18 18 17  '$ Temp: 98.2 F (36.8 C) 98.4 F (36.9 C) 98.5 F (36.9 C) 98.6 F (37 C)  TempSrc:  Oral Oral Oral  SpO2: 98% 99% 98% 100%  Weight:      Height:        Intake/Output Summary (Last 24 hours) at 07/24/2020 1656 Last data filed at 07/24/2020 1602 Gross per 24 hour  Intake 1217.1 ml  Output 1600 ml  Net -382.9 ml   Filed Weights   07/20/20 2337 07/22/20 1300  Weight: 116 kg 115.2 kg    Examination:  General exam: Appears calm and comfortable  Respiratory system: Clear to auscultation. Respiratory effort normal. Cardiovascular system: S1 & S2 heard, RRR. No JVD, murmurs, rubs, gallops or clicks. 1+ LE edema. Gastrointestinal system: Abdomen is obese, non-tender, no rebound. No organomegaly or masses felt. Normal bowel sounds heard. Central nervous system: Alert and oriented. No focal neurological deficits. Extremities: Symmetric 5 x 5 power. Skin: No rashes, lesions or ulcers Psychiatry: Judgement and insight appear normal. Mood & affect appropriate.     Data Reviewed: I have personally reviewed following labs and imaging studies  CBC: Recent Labs  Lab 07/23/20 0611 07/23/20 1219 07/23/20 1841 07/24/20 0632 07/24/20 1213  WBC 12.4* 11.3* 10.7* 9.4 10.2  HGB 6.1* 7.1* 6.8* 6.9* 7.0*  HCT 18.5* 21.0* 20.7* 20.9* 21.7*  MCV 91.1 90.9 92.0 91.7 93.9  PLT 155 157 168 175 [id number]   Basic Metabolic Panel: Recent Labs  Lab 07/20/20 2343 07/22/20 0728 07/23/20 0611 07/24/20 0632  NA 139 138 137 137  K 4.1 5.2* 4.6 4.1  CL 111 113* 113* 111  CO2 20* 18* 20* 20*  GLUCOSE 114* 129* 98 91  BUN 44* 41* 35* 27*  CREATININE 2.77* 2.54* 2.68* 2.46*  CALCIUM 8.2* 7.3* 7.4* 7.8*   GFR: Estimated Creatinine Clearance: 43.8 mL/min (A) (by C-G formula based on SCr of 2.46 mg/dL (H)). Liver Function Tests: Recent Labs  Lab 07/20/20 2343  AST 11*  ALT 5  ALKPHOS 45  BILITOT 0.8  PROT 5.5*  ALBUMIN 2.9*   Recent Labs  Lab  07/20/20 2343  LIPASE 28   No results for input(s): AMMONIA in the last 168 hours. Coagulation Profile: Recent Labs  Lab 07/21/20 0029 07/21/20 1822  INR 1.3* 1.4*   Cardiac Enzymes: No results for input(s): CKTOTAL, CKMB, CKMBINDEX, TROPONINI in the last 168 hours. BNP (last 3 results) No results for input(s): PROBNP in the last 8760 hours. HbA1C: No results for input(s): HGBA1C in the last 72 hours. CBG: No results for input(s): GLUCAP in the last 168 hours. Lipid Profile: No results for input(s): CHOL, HDL, LDLCALC, TRIG, CHOLHDL, LDLDIRECT in the last 72 hours. Thyroid Function Tests: No results for input(s): TSH, T4TOTAL, FREET4, T3FREE, THYROIDAB in the last 72 hours. Anemia Panel: No results for input(s): VITAMINB12, FOLATE, FERRITIN, TIBC, IRON, RETICCTPCT in the last 72 hours. Urine analysis:    Component Value Date/Time   COLORURINE YELLOW (A) 07/20/2020 2343   APPEARANCEUR CLEAR (A) 07/20/2020 2343   APPEARANCEUR  Clear 02/26/2014 1126   LABSPEC 1.016 07/20/2020 2343   LABSPEC 1.008 02/26/2014 1126   PHURINE 5.0 07/20/2020 [place] 07/20/2020 2343   GLUCOSEU Negative 02/26/2014 [place] 07/20/2020 2343   [place] 07/20/2020 2343   BILIRUBINUR Negative 02/26/2014 [place] 07/20/2020 2343   PROTEINUR 100 (A) 07/20/2020 2343   NITRITE NEGATIVE 07/20/2020 2343   LEUKOCYTESUR TRACE (A) 07/20/2020 2343   LEUKOCYTESUR 1+ 02/26/2014 1126   Sepsis Labs: '@LABRCNTIP'$ (procalcitonin:4,lacticidven:4)  ) Recent Results (from the past 240 hour(s))  Resp Panel by RT-PCR (Flu A&B, Covid) Nasopharyngeal Swab     Status: None   Collection Time: 07/21/20 12:05 AM   Specimen: Nasopharyngeal Swab; Nasopharyngeal(NP) swabs in vial transport medium  Result Value Ref Range Status   SARS Coronavirus 2 by RT PCR NEGATIVE NEGATIVE Final    Comment: (NOTE) SARS-CoV-2 target nucleic acids are NOT DETECTED.  The SARS-CoV-2  RNA is generally detectable in upper respiratory specimens during the acute phase of infection. The lowest concentration of SARS-CoV-2 viral copies this assay can detect is 138 copies/mL. A negative result does not preclude SARS-Cov-2 infection and should not be used as the sole basis for treatment or other patient management decisions. A negative result may occur with  improper specimen collection/handling, submission of specimen other than nasopharyngeal swab, presence of viral mutation(s) within the areas targeted by this assay, and inadequate number of viral copies(<138 copies/mL). A negative result must be combined with clinical observations, patient history, and epidemiological information. The expected result is Negative.  Fact Sheet for Patients:  EntrepreneurPulse.com.au  Fact Sheet for Healthcare Providers:  IncredibleEmployment.be  This test is no t yet approved or cleared by the [place] FDA and  has been authorized for detection and/or diagnosis of SARS-CoV-2 by FDA under an Emergency Use Authorization (EUA). This EUA will remain  in effect (meaning this test can be used) for the duration of the COVID-19 declaration under Section 564(b)(1) of the Act, 21 U.S.C.section 360bbb-3(b)(1), unless the authorization is terminated  or revoked sooner.       Influenza A by PCR NEGATIVE NEGATIVE Final   Influenza B by PCR NEGATIVE NEGATIVE Final    Comment: (NOTE) The Xpert Xpress SARS-CoV-2/FLU/RSV plus assay is intended as an aid in the diagnosis of influenza from Nasopharyngeal swab specimens and should not be used as a sole basis for treatment. Nasal washings and aspirates are unacceptable for Xpert Xpress SARS-CoV-2/FLU/RSV testing.  Fact Sheet for Patients: EntrepreneurPulse.com.au  Fact Sheet for Healthcare Providers: IncredibleEmployment.be  This test is not yet approved or cleared by the  [place] FDA and has been authorized for detection and/or diagnosis of SARS-CoV-2 by FDA under an Emergency Use Authorization (EUA). This EUA will remain in effect (meaning this test can be used) for the duration of the COVID-19 declaration under Section 564(b)(1) of the Act, 21 U.S.C. section 360bbb-3(b)(1), unless the authorization is terminated or revoked.  Performed at [facility], [address], [place], [place] [address]          Radiology Studies: No results found.      Scheduled Meds: . sodium chloride   Intravenous Once  . sodium chloride   Intravenous Once  . sodium chloride   Intravenous Once  . sodium chloride   Intravenous Once  . atorvastatin  80 mg Oral Daily   Continuous Infusions: . sodium chloride    . sodium chloride 75 mL/hr at 07/23/20 1853  .  famotidine (PEPCID) IV 20 mg (07/24/20 0121)     LOS: 3 days    Time spent: 60 min    Mikaeel Petrow Manuella Ghazi, MD [facility]   If 7PM-7AM, please contact night-coverage [email or web address] Password [facility] 07/24/2020, 4:56 PM

## 2020-07-25 DIAGNOSIS — N184 Chronic kidney disease, stage 4 (severe): Secondary | ICD-10-CM | POA: Diagnosis not present

## 2020-07-25 DIAGNOSIS — K922 Gastrointestinal hemorrhage, unspecified: Secondary | ICD-10-CM | POA: Diagnosis not present

## 2020-07-25 DIAGNOSIS — D62 Acute posthemorrhagic anemia: Secondary | ICD-10-CM | POA: Diagnosis not present

## 2020-07-25 DIAGNOSIS — I5042 Chronic combined systolic (congestive) and diastolic (congestive) heart failure: Secondary | ICD-10-CM | POA: Diagnosis not present

## 2020-07-25 LAB — TYPE AND SCREEN
ABO/RH(D): O POS
Antibody Screen: NEGATIVE
Unit division: 0
Unit division: 0
Unit division: 0
Unit division: 0
Unit division: 0
Unit division: 0
Unit division: 0
Unit division: 0
Unit division: 0

## 2020-07-25 LAB — BPAM RBC
Blood Product Expiration Date: 202206152359
Blood Product Expiration Date: 202206182359
Blood Product Expiration Date: 202206182359
Blood Product Expiration Date: 202206182359
Blood Product Expiration Date: 202206272359
Blood Product Expiration Date: 202207012359
Blood Product Expiration Date: 202207012359
Blood Product Expiration Date: 202207032359
Blood Product Expiration Date: 202207032359
ISSUE DATE / TIME: 202205290407
ISSUE DATE / TIME: 202205291151
ISSUE DATE / TIME: 202205291517
ISSUE DATE / TIME: 202205300846
ISSUE DATE / TIME: 202205301010
ISSUE DATE / TIME: 202205301010
ISSUE DATE / TIME: 202205310859
Unit Type and Rh: 5100
Unit Type and Rh: 5100
Unit Type and Rh: 5100
Unit Type and Rh: 5100
Unit Type and Rh: 5100
Unit Type and Rh: 5100
Unit Type and Rh: 5100
Unit Type and Rh: 5100
Unit Type and Rh: 5100

## 2020-07-25 LAB — BASIC METABOLIC PANEL
Anion gap: 7 (ref 5–15)
BUN: 22 mg/dL — ABNORMAL HIGH (ref 6–20)
CO2: 20 mmol/L — ABNORMAL LOW (ref 22–32)
Calcium: 7.8 mg/dL — ABNORMAL LOW (ref 8.9–10.3)
Chloride: 111 mmol/L (ref 98–111)
Creatinine, Ser: 2.58 mg/dL — ABNORMAL HIGH (ref 0.61–1.24)
GFR, Estimated: 29 mL/min — ABNORMAL LOW (ref >60)
Glucose, Bld: 90 mg/dL (ref 70–99)
Potassium: 4.2 mmol/L (ref 3.5–5.1)
Sodium: 138 mmol/L (ref 135–145)

## 2020-07-25 LAB — CBC
HCT: 22 % — ABNORMAL LOW (ref 39.0–52.0)
Hemoglobin: 7.2 g/dL — ABNORMAL LOW (ref 13.0–17.0)
MCH: 30.6 pg (ref 26.0–34.0)
MCHC: 32.7 g/dL (ref 30.0–36.0)
MCV: 93.6 fL (ref 80.0–100.0)
Platelets: 178 10*3/uL (ref 150–400)
RBC: 2.35 MIL/uL — ABNORMAL LOW (ref 4.22–5.81)
RDW: 17.2 % — ABNORMAL HIGH (ref 11.5–15.5)
WBC: 8.9 10*3/uL (ref 4.0–10.5)
nRBC: 0 % (ref 0.0–0.2)

## 2020-07-25 MED ORDER — FAMOTIDINE 40 MG PO TABS: 20.0000 mg | ORAL_TABLET | Freq: Two times a day (BID) | ORAL | 0 refills | Status: DC

## 2020-07-25 NOTE — Discharge Instructions (Signed)

## 2020-07-25 NOTE — Progress Notes (Signed)
Reviewed DC paperwork with pt. All questions answered. Belongings sent with pt including home meds (gababapentin) from pharmacy. IV taken out. Pt taken to [place] at front door in wheelchair and transferred to car.

## 2020-07-27 NOTE — Discharge Summary (Signed)
[place] [facility] [place] NAME: Hector [name]    MR#:  [id number]  DATE OF BIRTH:  19-Jul-1965  DATE OF ADMISSION:  07/20/2020   ADMITTING PHYSICIAN: Athena Masse, MD  DATE OF DISCHARGE: 07/25/2020 11:21 AM  PRIMARY CARE PHYSICIAN: [name], MD   ADMISSION DIAGNOSIS:  Hematochezia [K92.1] GIB (gastrointestinal bleeding) [K92.2] Symptomatic anemia [D64.9] DISCHARGE DIAGNOSIS:  Principal Problem:   GIB (gastrointestinal bleeding) Active Problems:   Paroxysmal atrial fibrillation (HCC)   Chronic combined systolic (congestive) and diastolic (congestive) heart failure (HCC)   Nonischemic cardiomyopathy (HCC)   Obstructive sleep apnea   CKD (chronic kidney disease) stage 4, GFR 15-29 ml/min (HCC)   Acute blood loss anemia  SECONDARY DIAGNOSIS:   Past Medical History:  Diagnosis Date  . Alcohol abuse   . 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 12/2007 MV: EF 28%, no isch;  b. 8/12 Echo: EF 25-35%; c. 02/2014 Echo: EF 20-25%; d. 12/2014 Cath: minimal CAD; e. 01/2015 Echo: EF 50-55%;  d. 05/2016 Echo: EF 30-35%, diff HK, gr2 DD; e. 11/2016 Echo: EF 45-50%, diff HK.  . Chronic combined systolic (congestive) and diastolic (congestive) heart failure ([place])    a. 05/2016 Most recent Echo: EF 30-35%, diff HK, Gr2 DD, mild MR, mod dil LA; b. 11/2016 Echo: EF 45-50%, diff HK.  . CKD (chronic kidney disease), stage III ([place])   . Elevated troponin    Chronically elevated. - level was 0.17-0.10 during recent admission  . Essential hypertension   . GI bleed 11/2013  . Hyperlipidemia   . Pancreatitis   . Paroxysmal A-fib ([place])    a. new onset s/p unsuccessful TEE/DCCV on 08/16/2014; b. on amio/eliquis (CHA2DS2VASc = 2-3); c. reports 1-2 hrs of afib ~ q63mo.  . Paroxysmal atrial flutter ([place]    a. new onset 07/2014; b. s/p unsuccessful TEE/DCCV 08/16/2014; c. on apixaban.  . Sleep-disordered breathing    Has yet to have a sleep study  . Stroke ([name]     HOSPITAL COURSE:  [place] a 55y.o.malewith medical history significant forNonobstructive CAD, chronic combined CHF secondary to nonischemic cardiomyopathy related to remote alcohol use, with last EF 45-50% December 2021 (previously 20% 2009, 45% 2018), paroxysmal A. fib not currently on anticoagulation, CKD 4, GI bleed 2015, OSA admitted for Hematochezia.  Hematochezia/GI bleed with history of GI bleed 2015 Acute blood loss anemia, with hypotension Patient with mult episodes of bright red blood per rectum associated with mild abdominal pain, vomiting, lightheadedness. Hemoglobin 7.9 on presentation, down from 12.12 a month prior. Received 3 units 5/29 and another 3 units on 5/30.  1 more unit on 5/31 last bleed was AM on 5/30. Tagged rbc scan shows rectal bleeding. Colonoscopy 5/30 failed to reveal source of bleed. Hemoglobin low but stable since then with no further bleed while in the hospital. -Patient refusing any further blood transfusion - cont h2 blocker (esomeprazole contraindication, appears caused interstitial nephritis) -Patient tolerating regular diet.  Paroxysmal atrial fibrillation (HCC) Pt reports hasn't taken home meds in months - rate controlled on amiodarone and coreg - stopping eliquis which pt says hasn't taken for months especially considering bleeding risk  Chronic combined systolic and diastolic heart failure Nonischemic cardiomyopathy ([name] -last EF 45-50% December 2021 (previously 20% 2009, 45% 2018) -Patient followed by Dr. GRockey Situ- well compensated at this time.  Nonobstructive CAD -No complaints of chest pain, EKG nonacute -No antiplatelets especially with risk of bleeding  Obstructive sleep  apnea -CPAP has been recommended but not yet on it, patient refusing  CKD (chronic kidney disease) stage 4, GFR 15-29 ml/min (HCC) -Creatinine upper 2s which appears to be his baseline  DISCHARGE CONDITIONS:  stable CONSULTS OBTAINED:    DRUG ALLERGIES:   Allergies  Allergen Reactions  . Esomeprazole Magnesium Other (See Comments)    Suspected interstitial nephritis 2018  . Eggs Or Egg-Derived Products Rash   DISCHARGE MEDICATIONS:   Allergies as of 07/25/2020      Reactions   Esomeprazole Magnesium Other (See Comments)   Suspected interstitial nephritis 2018   Eggs Or Egg-derived Products Rash      Medication List    STOP taking these medications   apixaban 5 MG Tabs tablet Commonly known as: ELIQUIS     TAKE these medications   allopurinol 100 MG tablet Commonly known as: ZYLOPRIM Take 100 mg by mouth daily as needed.   amiodarone 200 MG tablet Commonly known as: PACERONE Take 1 tablet (200 mg total) by mouth daily.   atorvastatin 80 MG tablet Commonly known as: LIPITOR Take 1 tablet (80 mg total) by mouth daily.   Biofreeze Roll-On Colorless 4 % Gel Generic drug: Menthol (Topical Analgesic) Apply 1 application topically daily.   carvedilol 6.25 MG tablet Commonly known as: COREG Take 1 tablet (6.25 mg total) by mouth 2 (two) times daily with a meal.   docusate sodium 100 MG capsule Commonly known as: COLACE Take 100 mg by mouth daily.   famotidine 40 MG tablet Commonly known as: PEPCID Take 0.5 tablets (20 mg total) by mouth 2 (two) times daily. What changed:  medication strength when to take this   feeding supplement (NEPRO CARB STEADY) Liqd Take 237 mLs by mouth 3 (three) times daily between meals.   ferrous gluconate 324 MG tablet Commonly known as: FERGON Take 324 mg by mouth daily with breakfast.   furosemide 40 MG tablet Commonly known as: LASIX Take 1 tablet (40 mg total) by mouth 2 (two) times daily for 15 days. What changed: Another medication with the same name was removed. Continue taking this medication, and follow the directions you see here.   gabapentin 100 MG capsule Commonly known as: Neurontin Take 2 capsules (200 mg total) by mouth 3 (three) times  daily. What changed: Another medication with the same name was removed. Continue taking this medication, and follow the directions you see here.   hydrALAZINE 25 MG tablet Commonly known as: APRESOLINE Take 1 tablet (25 mg total) by mouth 4 (four) times daily.   levothyroxine 25 MCG tablet Commonly known as: SYNTHROID Take 25 mcg by mouth daily before breakfast.   ondansetron 4 MG tablet Commonly known as: ZOFRAN Take 4 mg by mouth every 8 (eight) hours as needed for nausea or vomiting.   PROSTAT PO Take 50 mLs by mouth daily.   vitamin B-12 500 MCG tablet Commonly known as: CYANOCOBALAMIN Take 500 mcg by mouth daily.      DISCHARGE INSTRUCTIONS:   DIET:  Regular diet DISCHARGE CONDITION:  Stable ACTIVITY:  Activity as tolerated OXYGEN:  Home Oxygen: No.  Oxygen Delivery: room air DISCHARGE LOCATION:  home   If you experience worsening of your admission symptoms, develop shortness of breath, life threatening emergency, suicidal or homicidal thoughts you must seek medical attention immediately by calling 911 or calling your MD immediately  if symptoms less severe.  You Must read complete instructions/literature along with all the possible adverse reactions/side effects for all the Medicines  you take and that have been prescribed to you. Take any new Medicines after you have completely understood and accpet all the possible adverse reactions/side effects.   Please note  You were cared for by a hospitalist during your hospital stay. If you have any questions about your discharge medications or the care you received while you were in the hospital after you are discharged, you can call the unit and asked to speak with the hospitalist on call if the hospitalist that took care of you is not available. Once you are discharged, your primary care physician will handle any further medical issues. Please note that NO REFILLS for any discharge medications will be authorized once you  are discharged, as it is imperative that you return to your primary care physician (or establish a relationship with a primary care physician if you do not have one) for your aftercare needs so that they can reassess your need for medications and monitor your lab values.    On the day of Discharge:  VITAL SIGNS:  Blood pressure (!) 137/98, pulse 74, temperature (!) 97.3 F (36.3 C), temperature source Oral, resp. rate 13, height '5\' 11"'$  (1.803 m), weight 115.2 kg, SpO2 98 %. PHYSICAL EXAMINATION:  GENERAL:  55 y.o.-year-old patient lying in the bed with no acute distress.  EYES: Pupils equal, round, reactive to light and accommodation. No scleral icterus. Extraocular muscles intact.  HEENT: Head atraumatic, normocephalic. Oropharynx and nasopharynx clear.  NECK:  Supple, no jugular venous distention. No thyroid enlargement, no tenderness.  LUNGS: Normal breath sounds bilaterally, no wheezing, rales,rhonchi or crepitation. No use of accessory muscles of respiration.  CARDIOVASCULAR: S1, S2 normal. No murmurs, rubs, or gallops.  ABDOMEN: Soft, non-tender, non-distended. Bowel sounds present. No organomegaly or mass.  EXTREMITIES: No pedal edema, cyanosis, or clubbing.  NEUROLOGIC: Cranial nerves II through XII are intact. Muscle strength 5/5 in all extremities. Sensation intact. Gait not checked.  PSYCHIATRIC: The patient is alert and oriented x 3.  SKIN: No obvious rash, lesion, or ulcer.  DATA REVIEW:   CBC Recent Labs  Lab 07/25/20 0638  WBC 8.9  HGB 7.2*  HCT 22.0*  PLT 178    Chemistries  Recent Labs  Lab 07/20/20 2343 07/22/20 0728 07/25/20 0638  NA 139   < > 138  K 4.1   < > 4.2  CL 111   < > 111  CO2 20*   < > 20*  GLUCOSE 114*   < > 90  BUN 44*   < > 22*  CREATININE 2.77*   < > 2.58*  CALCIUM 8.2*   < > 7.8*  AST 11*  --   --   ALT 5  --   --   ALKPHOS 45  --   --   BILITOT 0.8  --   --    < > = values in this interval not displayed.     Outpatient  follow-up  Follow-up Information    [name], MD. Go on 08/01/2020.   Specialty: Family Medicine Why: @ 11:40am Contact information: [place] [address] [phone]        [name], MD. Go on 08/06/2020.   Specialty: Cardiology Why: '@11'$ :30 am Contact information: [place] [address] [phone]        Call Efrain Sella, MD.   Specialty: Gastroenterology Why: Nurse from office will call patient to schedule appointment. Contact information: [id number]  [place] [address] [phone]               30 Day Unplanned Readmission Risk Score   Flowsheet Row ED to Hosp-Admission (Discharged) from 07/20/2020 in [facility]  30 Day Unplanned Readmission Risk Score (%) 35.25 Filed at 07/25/2020 0801     This score is the patient's risk of an unplanned readmission within 30 days of being discharged (0 -100%). The score is based on dignosis, age, lab data, medications, orders, and past utilization.   Low:  0-14.9   Medium: 15-21.9   High: 22-29.9   Extreme: 30 and above       Patient is at extremely high risk for readmission. See above Risk factor staritification.  Management plans discussed with the patient, family and they are in agreement.  CODE STATUS: Prior   TOTAL TIME TAKING CARE OF THIS PATIENT: 45 minutes.    Max Sane M.D on 07/27/2020 at 11:41 AM  [facility]   CC: Primary care physician; [name], MD   Note: This dictation was prepared with Dragon dictation along with smaller phrase technology. Any transcriptional errors that result from this process are unintentional.

## 2020-08-01 ENCOUNTER — Ambulatory Visit: Payer: Medicaid Other | Admitting: Family

## 2020-08-06 ENCOUNTER — Ambulatory Visit: Payer: Medicaid Other | Admitting: Physician Assistant

## 2020-08-23 ENCOUNTER — Ambulatory Visit: Payer: Medicaid Other | Admitting: Physician Assistant

## 2020-08-27 ENCOUNTER — Emergency Department: Payer: Medicaid Other

## 2020-08-27 ENCOUNTER — Other Ambulatory Visit: Payer: Self-pay

## 2020-08-27 ENCOUNTER — Emergency Department
Admission: EM | Admit: 2020-08-27 | Discharge: 2020-08-27 | Disposition: A | Discharge status: Home / Self Care | Payer: Medicaid Other | Attending: Emergency Medicine | Admitting: Emergency Medicine

## 2020-08-27 DIAGNOSIS — R202 Paresthesia of skin: Secondary | ICD-10-CM | POA: Insufficient documentation

## 2020-08-27 DIAGNOSIS — N184 Chronic kidney disease, stage 4 (severe): Secondary | ICD-10-CM | POA: Diagnosis not present

## 2020-08-27 DIAGNOSIS — I5042 Chronic combined systolic (congestive) and diastolic (congestive) heart failure: Secondary | ICD-10-CM | POA: Diagnosis not present

## 2020-08-27 DIAGNOSIS — I130 Hypertensive heart and chronic kidney disease with heart failure and stage 1 through stage 4 chronic kidney disease, or unspecified chronic kidney disease: Secondary | ICD-10-CM | POA: Insufficient documentation

## 2020-08-27 DIAGNOSIS — Z79899 Other long term (current) drug therapy: Secondary | ICD-10-CM | POA: Insufficient documentation

## 2020-08-27 DIAGNOSIS — M5412 Radiculopathy, cervical region: Secondary | ICD-10-CM | POA: Insufficient documentation

## 2020-08-27 DIAGNOSIS — Z87891 Personal history of nicotine dependence: Secondary | ICD-10-CM | POA: Diagnosis not present

## 2020-08-27 DIAGNOSIS — M542 Cervicalgia: Secondary | ICD-10-CM | POA: Diagnosis present

## 2020-08-27 LAB — DIFFERENTIAL
Abs Immature Granulocytes: 0.07 10*3/uL (ref 0.00–0.07)
Basophils Absolute: 0.1 10*3/uL (ref 0.0–0.1)
Basophils Relative: 1 %
Eosinophils Absolute: 0.1 10*3/uL (ref 0.0–0.5)
Eosinophils Relative: 1 %
Immature Granulocytes: 1 %
Lymphocytes Relative: 14 %
Lymphs Abs: 1.4 10*3/uL (ref 0.7–4.0)
Monocytes Absolute: 0.6 10*3/uL (ref 0.1–1.0)
Monocytes Relative: 6 %
Neutro Abs: 7.5 10*3/uL (ref 1.7–7.7)
Neutrophils Relative %: 77 %

## 2020-08-27 LAB — COMPREHENSIVE METABOLIC PANEL
ALT: 8 U/L (ref 0–44)
AST: 12 U/L — ABNORMAL LOW (ref 15–41)
Albumin: 3.8 g/dL (ref 3.5–5.0)
Alkaline Phosphatase: 61 U/L (ref 38–126)
Anion gap: 9 (ref 5–15)
BUN: 40 mg/dL — ABNORMAL HIGH (ref 6–20)
CO2: 20 mmol/L — ABNORMAL LOW (ref 22–32)
Calcium: 8.9 mg/dL (ref 8.9–10.3)
Chloride: 110 mmol/L (ref 98–111)
Creatinine, Ser: 2.83 mg/dL — ABNORMAL HIGH (ref 0.61–1.24)
GFR, Estimated: 25 mL/min — ABNORMAL LOW (ref >60)
Glucose, Bld: 78 mg/dL (ref 70–99)
Potassium: 4.4 mmol/L (ref 3.5–5.1)
Sodium: 139 mmol/L (ref 135–145)
Total Bilirubin: 1.6 mg/dL — ABNORMAL HIGH (ref 0.3–1.2)
Total Protein: 7 g/dL (ref 6.5–8.1)

## 2020-08-27 LAB — PROTIME-INR
INR: 1.2 (ref 0.8–1.2)
Prothrombin Time: 14.9 seconds (ref 11.4–15.2)

## 2020-08-27 LAB — CBC
HCT: 30.7 % — ABNORMAL LOW (ref 39.0–52.0)
Hemoglobin: 9.3 g/dL — ABNORMAL LOW (ref 13.0–17.0)
MCH: 26 pg (ref 26.0–34.0)
MCHC: 30.3 g/dL (ref 30.0–36.0)
MCV: 85.8 fL (ref 80.0–100.0)
Platelets: 414 10*3/uL — ABNORMAL HIGH (ref 150–400)
RBC: 3.58 MIL/uL — ABNORMAL LOW (ref 4.22–5.81)
RDW: 17.5 % — ABNORMAL HIGH (ref 11.5–15.5)
WBC: 9.7 10*3/uL (ref 4.0–10.5)
nRBC: 0.2 % (ref 0.0–0.2)

## 2020-08-27 LAB — APTT: aPTT: 39 seconds — ABNORMAL HIGH (ref 24–36)

## 2020-08-27 IMAGING — MR MR CERVICAL SPINE W/O CM
5 series · 39 of 48 positions shown · non-contrast
Comparison: None.

CLINICAL DATA: Right-sided arm numbness and bilateral leg tingling.

EXAM:
MRI CERVICAL SPINE WITHOUT CONTRAST
TECHNIQUE: Multiplanar, multisequence MR imaging of the cervical spine was
performed. No intravenous contrast was administered.

[Series 6: FLAIR · sagittal · 3.0mm · 0.78mm/px · 6 of 15 slices shown]
[im 1/15]
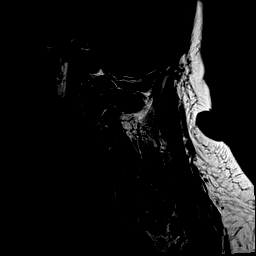
[im 3/15]
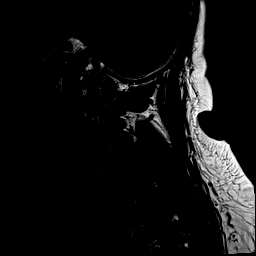
[im 6/15]
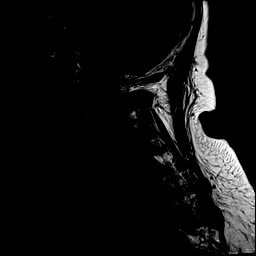
[im 9/15]
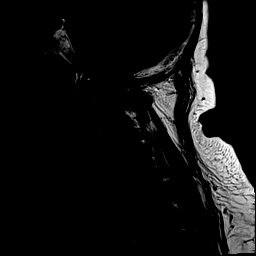
[im 12/15]
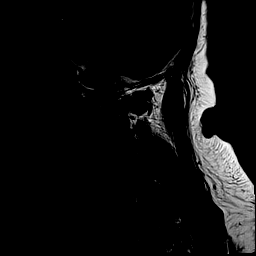
[im 15/15]
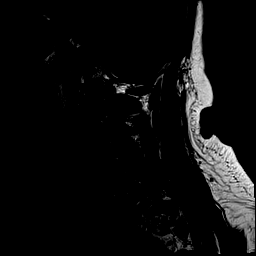

[Series 7: STIR · sagittal · 3.0mm · 0.62mm/px · 7 of 15 slices shown]
[im 1/15]
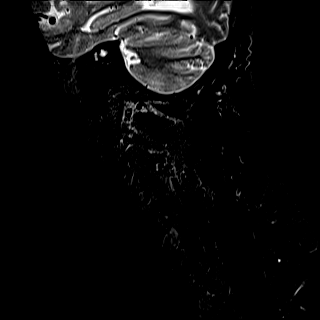
[im 3/15]
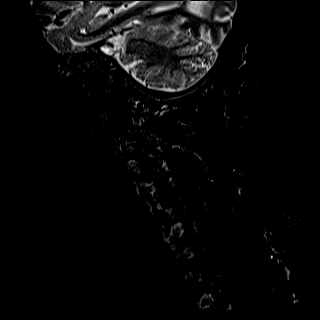
[im 5/15]
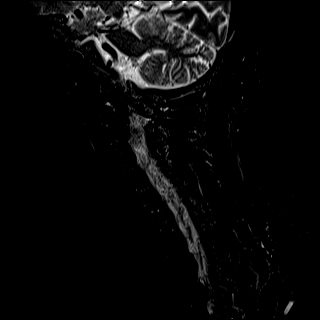
[im 8/15]
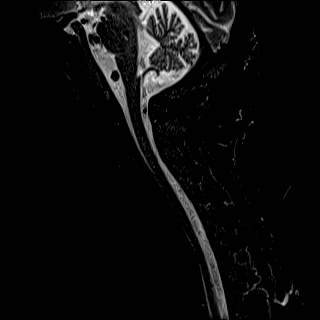
[im 10/15]
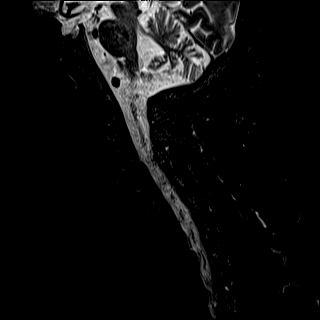
[im 12/15]
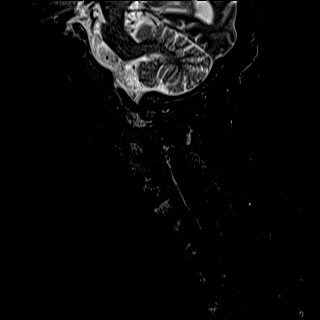
[im 15/15]
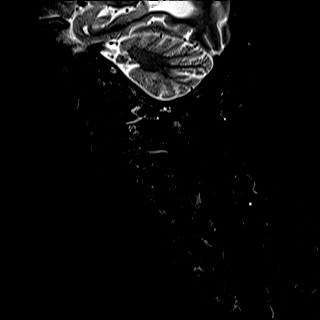

[Series 8: T2 · sagittal · 3.0mm · 0.62mm/px · 7 of 15 slices shown (1 of 2)]
[im 1/15]
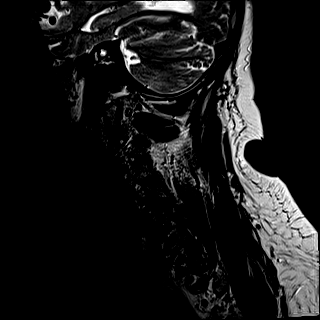
[im 3/15]
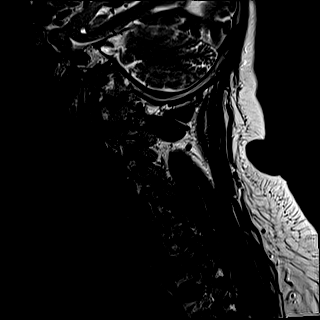
[im 5/15]
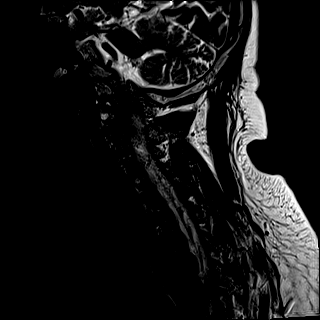
[im 8/15]
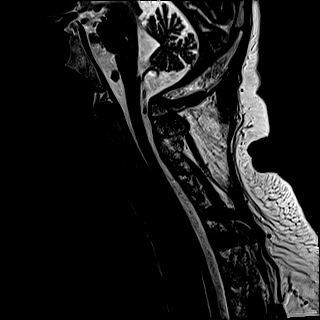
[im 10/15]
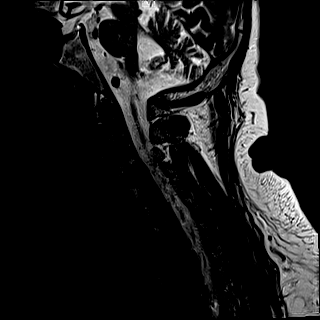
[im 12/15]
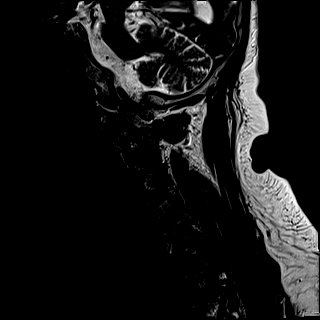
[im 15/15]
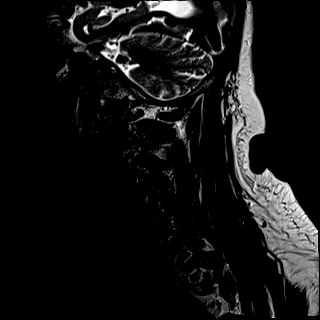

[Series 9: T2 · axial · 3.0mm · 0.70mm/px · z∈[-139,-47]mm · 11 of 29 slices shown (2 of 2)]
[im 1/29]
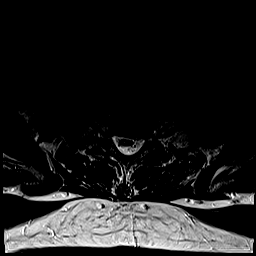
[im 3/29]
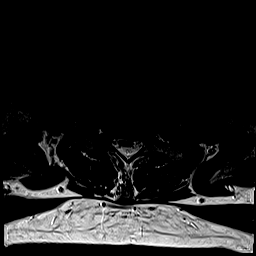
[im 5/29]
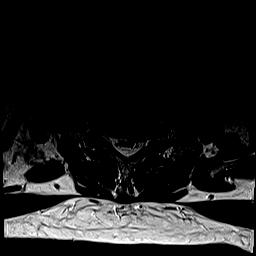
[im 7/29]
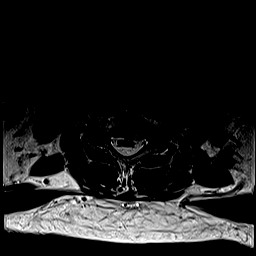
[im 9/29]
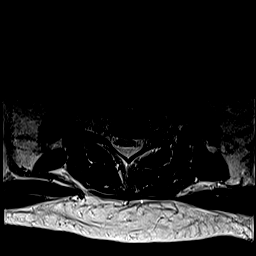
[im 11/29]
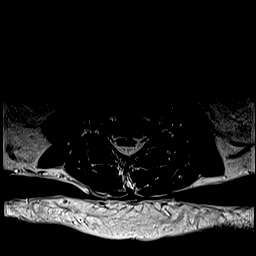
[im 13/29]
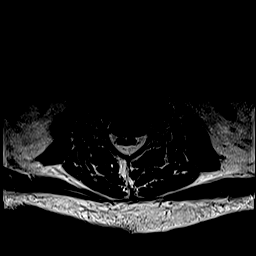
[im 16/29]
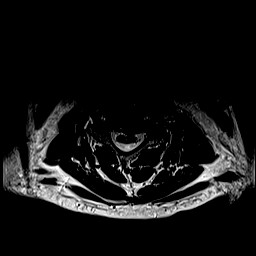
[im 20/29]
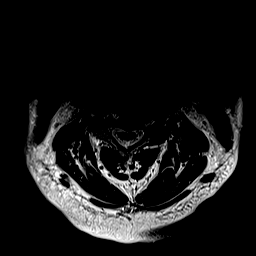
[im 24/29]
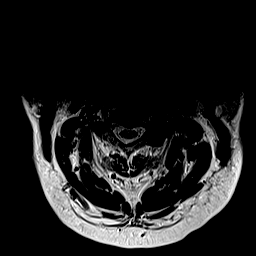
[im 29/29]
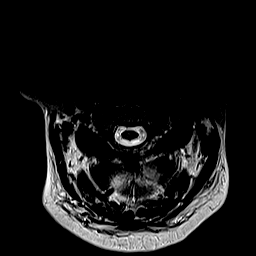

[Series 10: ax mpgr · axial · 3.0mm · 0.35mm/px · z∈[-139,-47]mm · 8 of 29 slices shown]
[im 1/29]
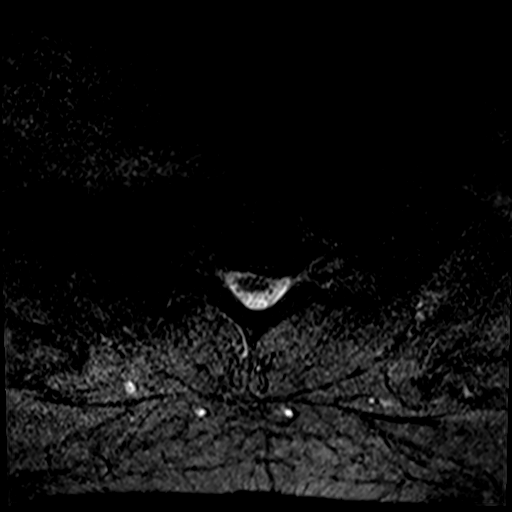
[im 5/29]
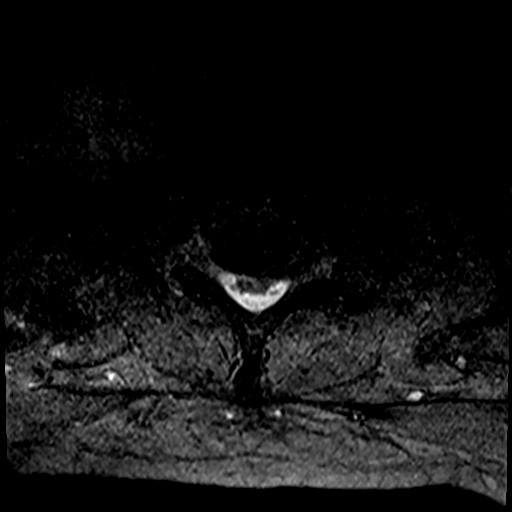
[im 9/29]
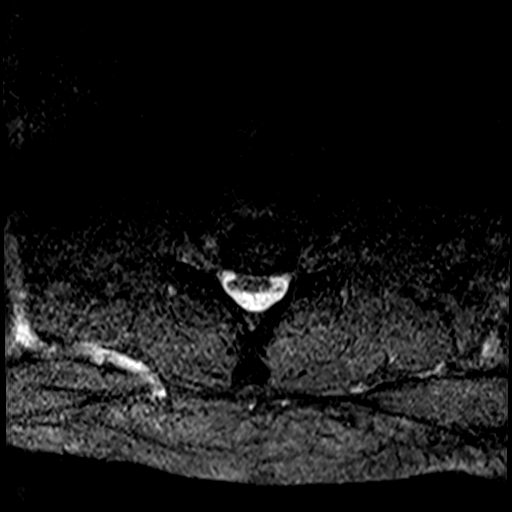
[im 13/29]
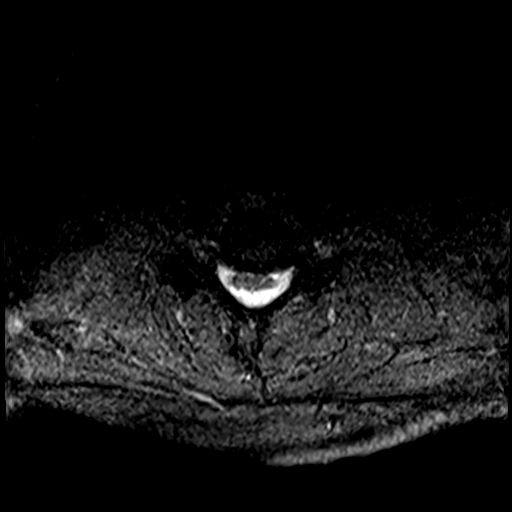
[im 16/29]
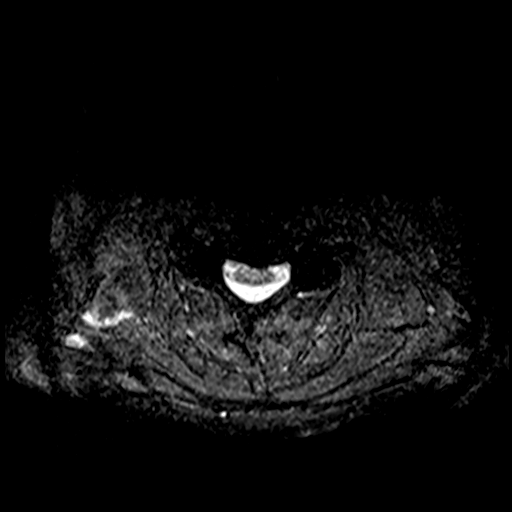
[im 20/29]
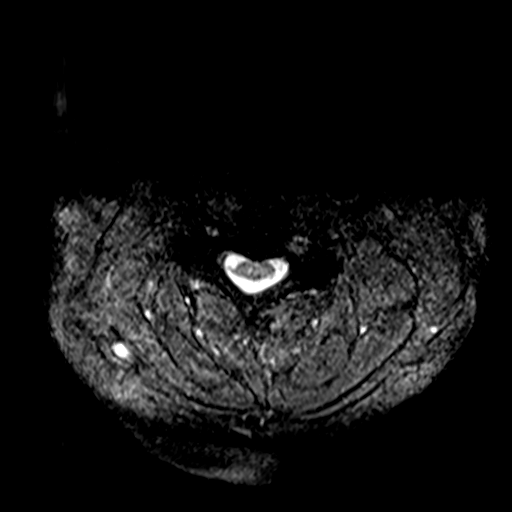
[im 24/29]
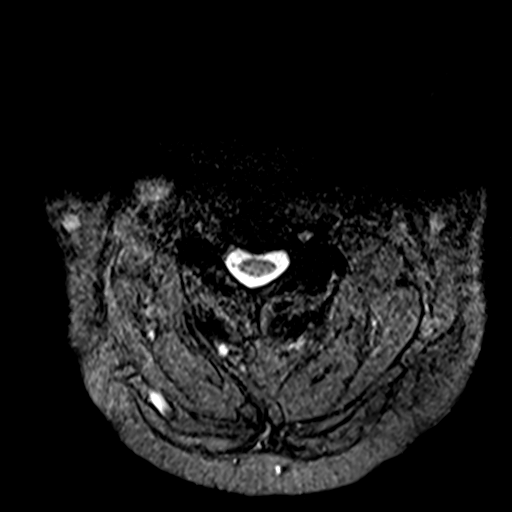
[im 29/29]
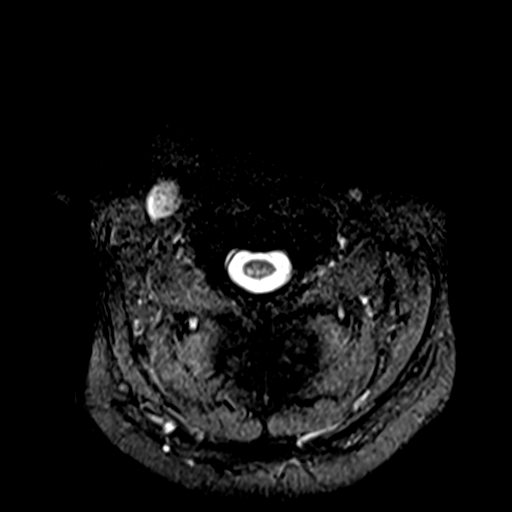

[39 of 48 positions shown; findings below may reference images not displayed]

FINDINGS: Alignment: Reversal of the normal cervical lordosis.  No listhesis.

Vertebrae: No fracture, evidence of discitis, or bone lesion.

Cord: Small foci of increased T2 signal within the right hemicord at
C2 and C3 (series 10, images 1 and 5). Slightly diminutive caliber
of the lower cervical cord.

Posterior Fossa, vertebral arteries, paraspinal tissues: None.

Disc levels:

C2-C3: Small shallow central disc protrusion. Left facet joint
ankylosis. No stenosis.

C3-C4: Tiny shallow central disc protrusion. Moderate left facet
uncovertebral hypertrophy. Mild left neuroforaminal stenosis. No
spinal canal or right neuroforaminal stenosis.

C4-C5: Tiny shallow central disc protrusion. Mild bilateral facet
uncovertebral hypertrophy. No stenosis.

C5-C6: Mild disc bulging. Moderate right facet arthropathy. No
stenosis.

C6-C7:  Negative.

C7-T1: Small left paracentral disc protrusion contacting and
flattening the left ventral cord. No stenosis.
IMPRESSION: 1. Small foci of increased T2 signal within the right hemicord at C2
and C3 are nonspecific, but could reflect demyelinating disease.
2. Mild multilevel degenerative changes of the cervical spine as
described above. Small left paracentral disc protrusion at C7-T1
contacting and flattening the left ventral cord. No high-grade
stenosis or impingement.

## 2020-08-27 IMAGING — CT CT HEAD W/O CM
3 series · 16 of 47 positions shown, 19 images · non-contrast
Comparison: MRI brain and CT head dated [DATE].

CLINICAL DATA: Right-sided arm numbness and bilateral leg tingling.

EXAM:
CT HEAD WITHOUT CONTRAST
TECHNIQUE: Contiguous axial images were obtained from the base of the skull
through the vertex without intravenous contrast.

[Series 3: head wo · axial · 0.45mm/px · z∈[-152,-22]mm · 10 of 32 slices shown, 13 images]
[im 3/32  brain]
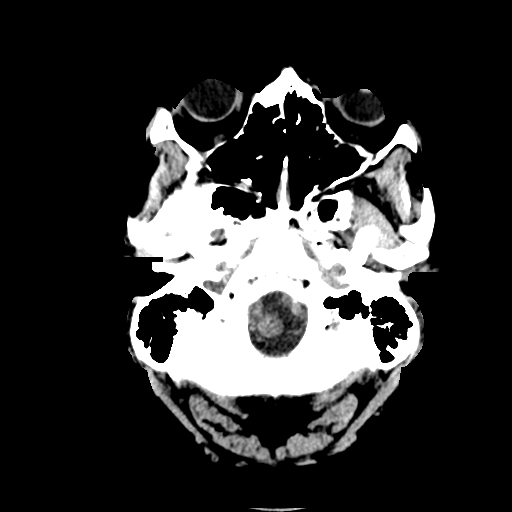
[im 3/32  bone]
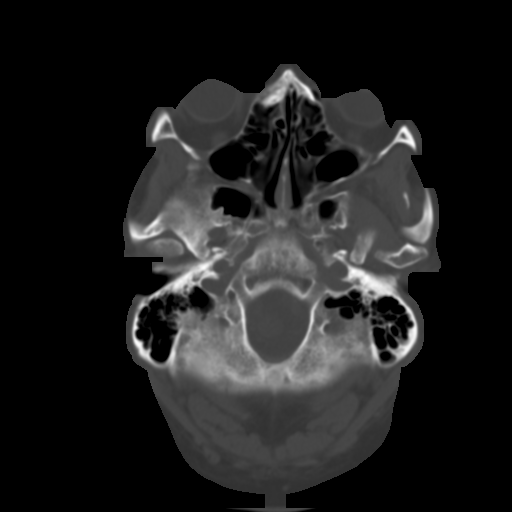
[im 6/32  brain]
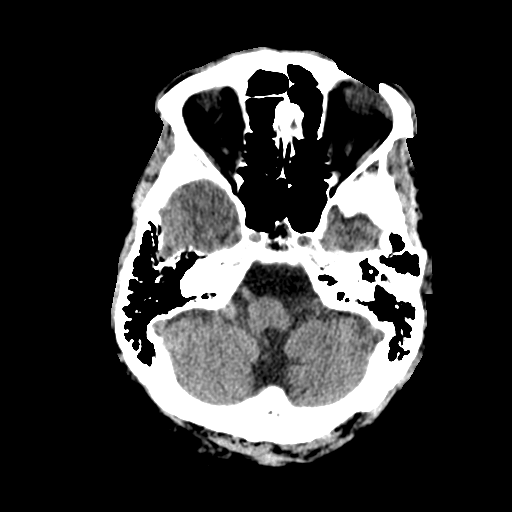
[im 9/32  brain]
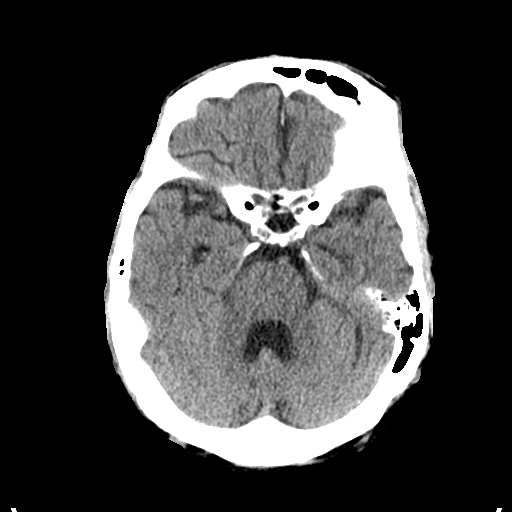
[im 11/32  brain]
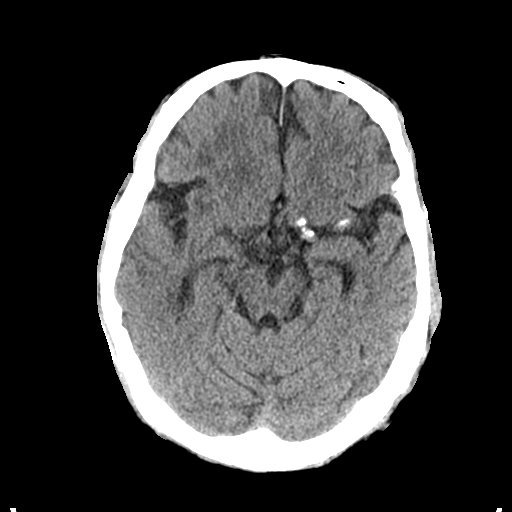
[im 14/32  brain]
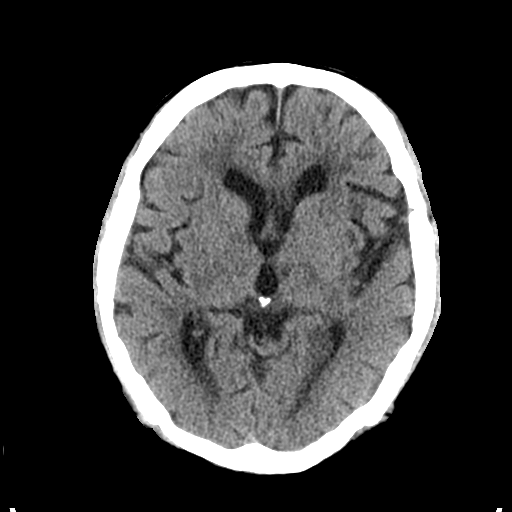
[im 14/32  bone]
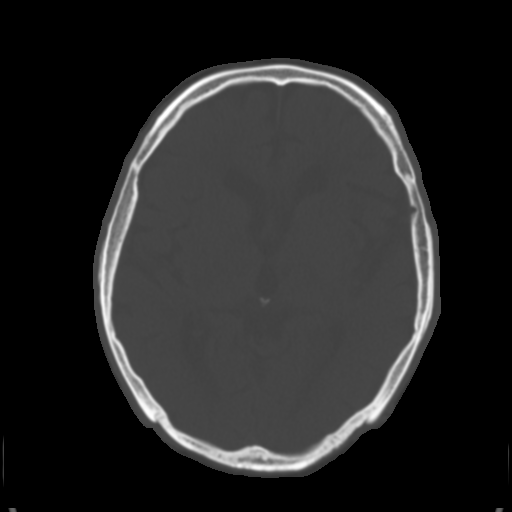
[im 18/32  brain]
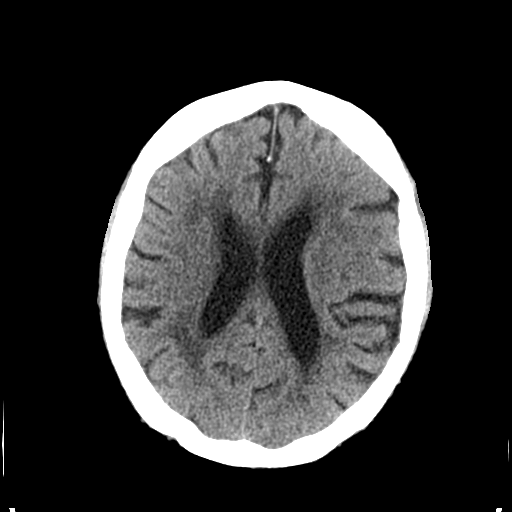
[im 21/32  brain]
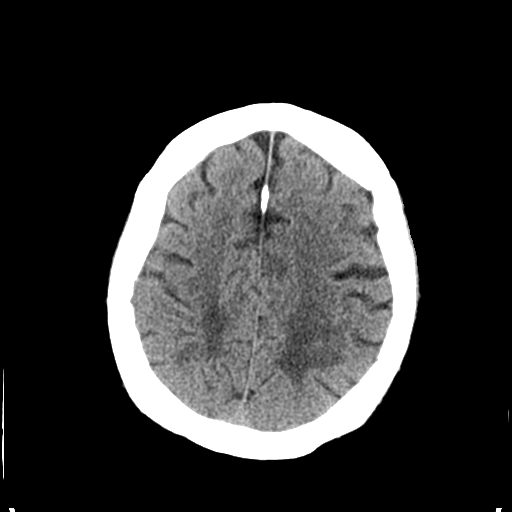
[im 24/32  brain]
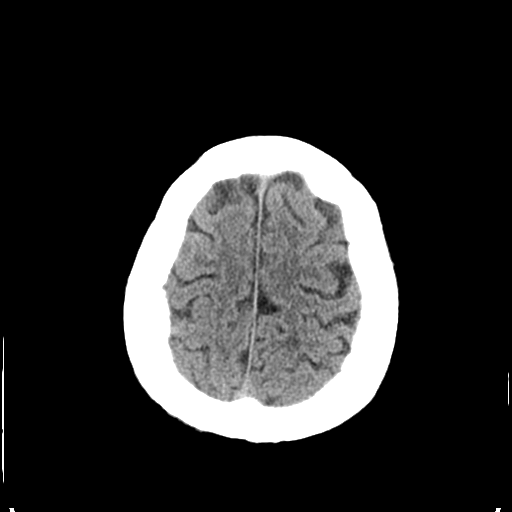
[im 26/32  brain]
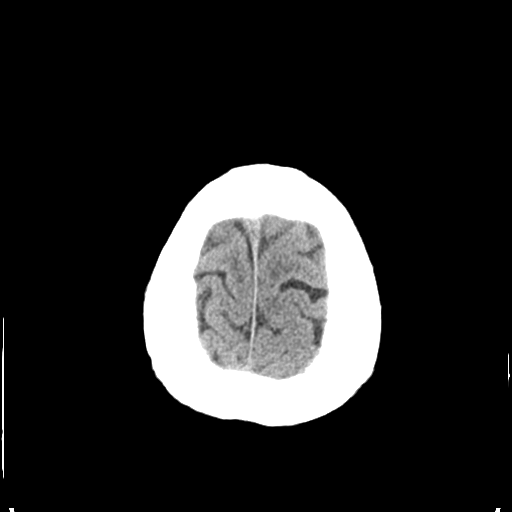
[im 26/32  bone]
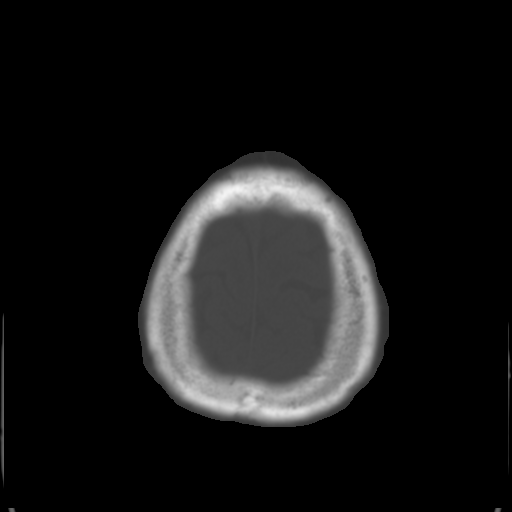
[im 29/32  brain]
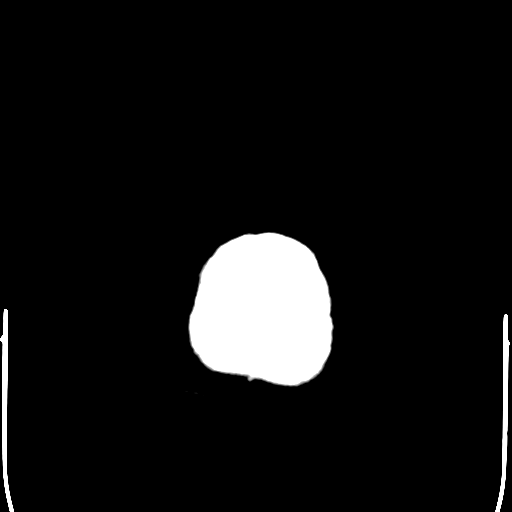

[Series 4: coronal soft tissue · coronal · 0.33mm/px · 3 of 66 slices shown]
[im 22/66  brain]
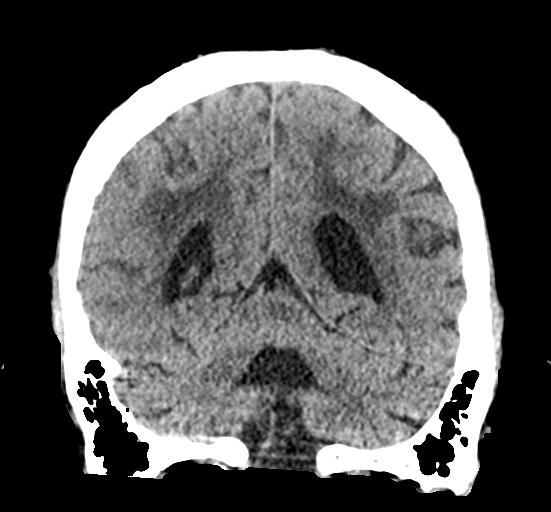
[im 29/66  brain]
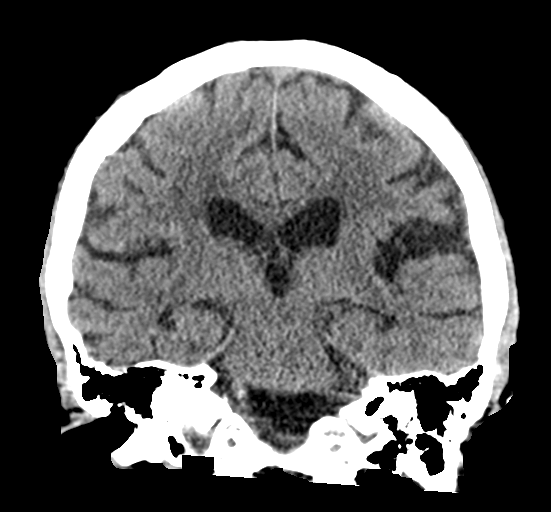
[im 37/66  brain]
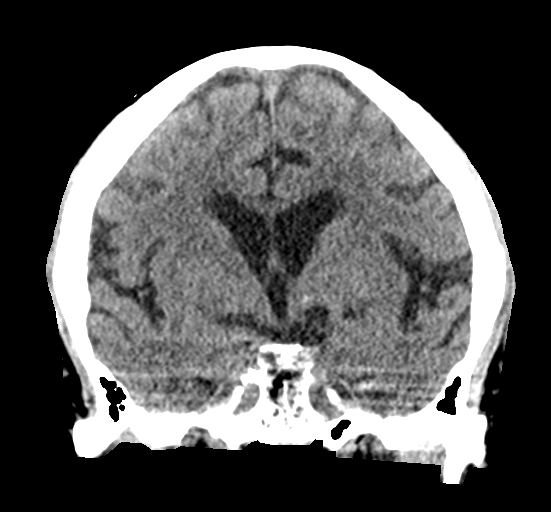

[Series 5: sagittal soft tissue · sagittal · 0.34mm/px · 3 of 58 slices shown]
[im 20/58  brain]
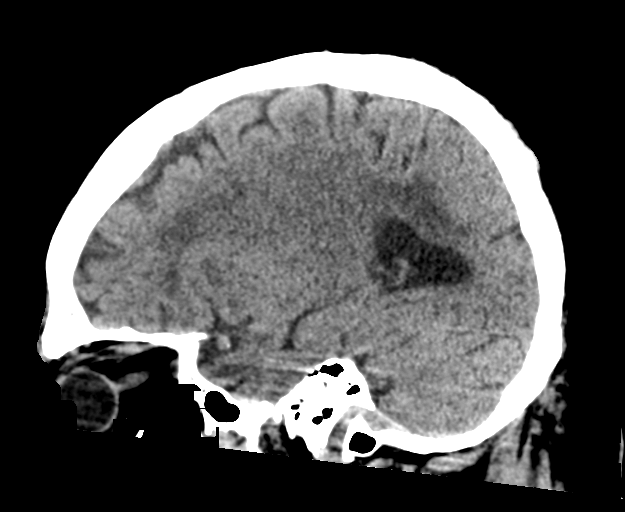
[im 29/58  brain]
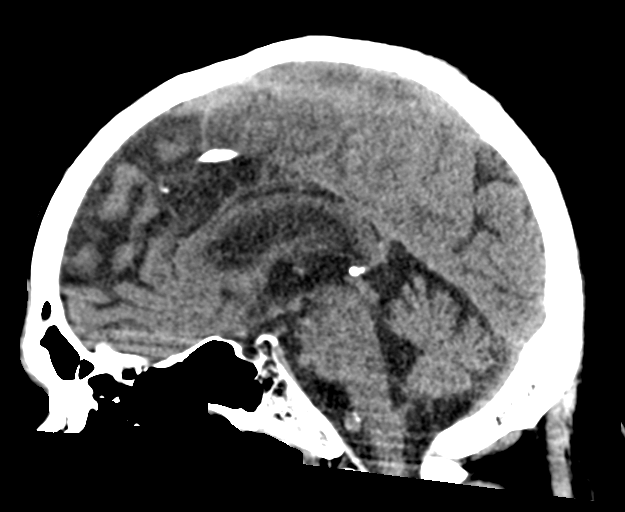
[im 39/58  brain]
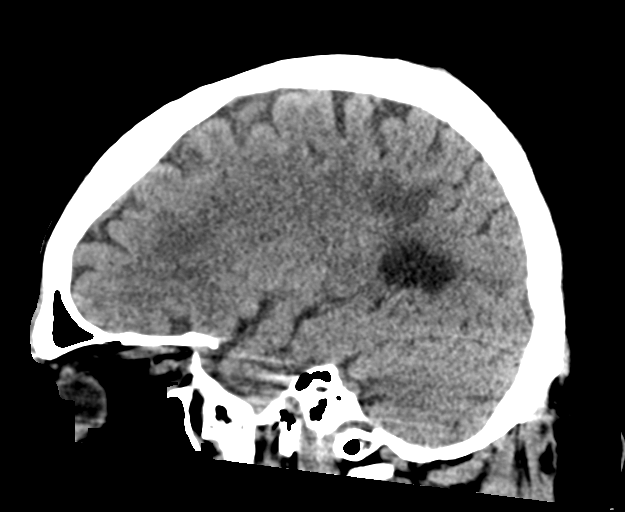

[16 of 47 positions shown; findings below may reference images not displayed]

FINDINGS: Brain: No evidence of acute infarction, hemorrhage, hydrocephalus,
extra-axial collection or mass lesion/mass effect. Stable age
advanced cerebral atrophy and chronic microvascular ischemic
changes.

Vascular: Calcified atherosclerosis. No hyperdense vessel.

Skull: Normal. Negative for fracture or focal lesion.

Sinuses/Orbits: No acute finding.

Other: None.
IMPRESSION: 1. No acute intracranial abnormality.
2. Stable age advanced cerebral atrophy and chronic microvascular
ischemic changes.

## 2020-08-27 MED ORDER — PREDNISONE 20 MG PO TABS: 40.0000 mg | ORAL_TABLET | Freq: Every day | ORAL | 0 refills | Status: DC

## 2020-08-27 MED ORDER — TRAMADOL HCL 50 MG PO TABS: 50.0000 mg | ORAL_TABLET | Freq: Four times a day (QID) | ORAL | 0 refills | Status: DC | PRN

## 2020-08-27 MED ORDER — SODIUM CHLORIDE 0.9% FLUSH
3.0000 mL | Freq: Once | INTRAVENOUS | Status: DC
Start: 2020-08-27 — End: 2020-08-28

## 2020-08-27 MED ORDER — HYDROCODONE-ACETAMINOPHEN 5-325 MG PO TABS
1.0000 | ORAL_TABLET | Freq: Once | ORAL | Status: AC
  Administered 2020-08-27: 1 via ORAL
  Filled 2020-08-27: qty 1

## 2020-08-27 MED ORDER — PREDNISONE 20 MG PO TABS: 40.0000 mg | ORAL_TABLET | Freq: Every day | ORAL | 0 refills | Status: AC

## 2020-08-27 NOTE — ED Notes (Signed)
Pt presents to ED with c/o of having numbness in his L hand that started 3 days ago, when pt was asked why he waited to so long for treatment pt states "it was fourth of July weekend". Pt states a HX of stroke that has affected this hand prior. Pt does have strong bilateral grips at this time.  Pt can move all fingers at this time and can use fine motor skills with this hand at all. Pt states the numbness is shooting and when pt was asked if he has had any injury to his back, pt states "maybe". Pt is A&Ox4 and currently NAD noted.

## 2020-08-27 NOTE — ED Notes (Signed)
Pt reports he is not driving himself home and that he will be calling a ride home

## 2020-08-27 NOTE — ED Provider Notes (Signed)
[facility] Emergency Department Provider Note ____________________________________________   Event Date/Time   First MD Initiated Contact with Patient 08/27/20 1535     (approximate)  I have reviewed the triage vital signs and the nursing notes.   HISTORY  Chief Complaint Numbness    HPI Hector [name] is a 55 y.o. male with PMH as noted below including CHF, CKD, GI bleed, paroxysmal atrial fibrillation, hypertension, and CVA who presents with left arm numbness and paresthesias over the last several months.  The patient states that this occurs intermittently and usually last for a few hours, however the current episode has been present for 2 days.  He states that involves the entire arm and radiates down from the neck.  He denies any weakness.  He states that he has taken gabapentin at home with no relief and applied a brace to his hand which also has not helped.  He states that he has had this problem since last fall when he was admitted with COVID.  He also previously had a stroke causing some numbness on the other side.  Currently, he denies any other weakness or numbness, difficulty with speech, vision changes, or other acute symptoms.   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 12/2007 MV: EF 28%, no isch;  b. 8/12 Echo: EF 25-35%; c. 02/2014 Echo: EF 20-25%; d. 12/2014 Cath: minimal CAD; e. 01/2015 Echo: EF 50-55%;  d. 05/2016 Echo: EF 30-35%, diff HK, gr2 DD; e. 11/2016 Echo: EF 45-50%, diff HK.   Chronic combined systolic (congestive) and diastolic (congestive) heart failure ([place])    a. 05/2016 Most recent Echo: EF 30-35%, diff HK, Gr2 DD, mild MR, mod dil LA; b. 11/2016 Echo: EF 45-50%, diff HK.   CKD (chronic kidney disease), stage III (HCC)    Elevated troponin    Chronically elevated. - level was 0.17-0.10 during recent admission   Essential hypertension    GI bleed 11/2013    Hyperlipidemia    Pancreatitis    Paroxysmal A-fib (HCC)    a. new onset s/p unsuccessful TEE/DCCV on 08/16/2014; b. on amio/eliquis (CHA2DS2VASc = 2-3); c. reports 1-2 hrs of afib ~ q21mo.   Paroxysmal atrial flutter ([place]    a. new onset 07/2014; b. s/p unsuccessful TEE/DCCV 08/16/2014; c. on apixaban.   Sleep-disordered breathing    Has yet to have a sleep study   Stroke ([facility]     Patient Active Problem List   Diagnosis Date Noted   CKD (chronic kidney disease) stage 4, GFR 15-29 ml/min ([place] 07/21/2020   GIB (gastrointestinal bleeding) 07/21/2020   Acute blood loss anemia 07/21/2020   Hyperthyroidism without thyroid nodule 02/03/2020   Acute respiratory distress syndrome (ARDS) due to COVID-19 virus ([place] 12/31/2019   Acute renal failure superimposed on stage 3b chronic kidney disease ([facility] 12/31/2019   Hyperkalemia 12/31/2019   Hyponatremia 12/31/2019   Obstructive sleep apnea 04/21/2016   Nonischemic cardiomyopathy ([place] 05/26/2015   Chronic combined systolic (congestive) and diastolic (congestive) heart failure ([place] 04/01/2015   Essential hypertension 04/01/2015   Sinus bradycardia 01/29/2015   Paroxysmal atrial fibrillation ([facility] 08/16/2014   Paroxysmal atrial flutter ([facility] 08/14/2014    Class: Acute   Chronic kidney disease (CKD), stage III (moderate) ([place] 07/02/2014   Hypertensive heart disease    Hyperlipidemia     Past Surgical History:  Procedure Laterality Date   CARDIAC CATHETERIZATION N/A 01/09/2015  Procedure: Left Heart Cath and Coronary Angiography;  Surgeon: [name], MD;  Location: [place] [facility];  Service: Cardiovascular;  Laterality: N/A;   COLONOSCOPY N/A 07/22/2020   Procedure: COLONOSCOPY;  Surgeon: Toledo, Benay Pike, MD;  Location: [facility] ENDOSCOPY;  Service: Gastroenterology;  Laterality: N/A;   ELECTROPHYSIOLOGIC STUDY N/A 08/16/2014   Procedure: CARDIOVERSION;  Surgeon: [name], MD;  Location: [facility] ORS;  Service: Cardiovascular;   Laterality: N/A;   FLEXIBLE SIGMOIDOSCOPY N/A 10/10/2015   Procedure: FLEXIBLE SIGMOIDOSCOPY;  Surgeon: [name], MD;  Location: [facility] ENDOSCOPY;  Service: Endoscopy;  Laterality: N/A;   KNEE SURGERY Right    NM MYOVIEW LTD  November 2011   No ischemia or infarction. EF 50-55% ( no improvement from 2009 Myoview EF of 28%   TEE WITHOUT CARDIOVERSION N/A 08/16/2014   Procedure: TRANSESOPHAGEAL ECHOCARDIOGRAM (TEE);  Surgeon: [name], MD;  Location: [facility] ORS;  Service: Cardiovascular;  Laterality: N/A;    Prior to Admission medications   Medication Sig Start Date End Date Taking? Authorizing Provider  predniSONE (DELTASONE) 20 MG tablet Take 2 tablets (40 mg total) by mouth daily with breakfast for 5 days. 08/27/20 09/01/20 Yes Arta Silence, MD  traMADol (ULTRAM) 50 MG tablet Take 1 tablet (50 mg total) by mouth every 6 (six) hours as needed. 08/27/20 08/27/21 Yes Arta Silence, MD  allopurinol (ZYLOPRIM) 100 MG tablet Take 100 mg by mouth daily as needed.    [provider]  amiodarone (PACERONE) 200 MG tablet Take 1 tablet (200 mg total) by mouth daily. 01/03/18   Alisa Graff, FNP  atorvastatin (LIPITOR) 80 MG tablet Take 1 tablet (80 mg total) by mouth daily. 03/15/18   Alisa Graff, FNP  carvedilol (COREG) 6.25 MG tablet Take 1 tablet (6.25 mg total) by mouth 2 (two) times daily with a meal. 02/07/20   Pokhrel, Laxman, MD  docusate sodium (COLACE) 100 MG capsule Take 100 mg by mouth daily.    [provider]  famotidine (PEPCID) 40 MG tablet Take 0.5 tablets (20 mg total) by mouth 2 (two) times daily. 07/25/20   Max Sane, MD  ferrous gluconate (FERGON) 324 MG tablet Take 324 mg by mouth daily with breakfast.    [provider]  furosemide (LASIX) 40 MG tablet Take 1 tablet (40 mg total) by mouth 2 (two) times daily for 15 days. 06/18/20 07/03/20  Naaman Plummer, MD  gabapentin (NEURONTIN) 100 MG capsule Take 2 capsules (200 mg total) by  mouth 3 (three) times daily. 06/20/20 08/19/20  Menshew, Dannielle Karvonen, PA-C  hydrALAZINE (APRESOLINE) 25 MG tablet Take 1 tablet (25 mg total) by mouth 4 (four) times daily. 06/18/20 07/18/20  Naaman Plummer, MD  levothyroxine (SYNTHROID) 25 MCG tablet Take 25 mcg by mouth daily before breakfast.    [provider]  Menthol, Topical Analgesic, (BIOFREEZE ROLL-ON COLORLESS) 4 % GEL Apply 1 application topically daily.    [provider]  Nutritional Supplements (FEEDING SUPPLEMENT, NEPRO CARB STEADY,) LIQD Take 237 mLs by mouth 3 (three) times daily between meals. 01/19/20   Enzo Bi, MD  ondansetron (ZOFRAN) 4 MG tablet Take 4 mg by mouth every 8 (eight) hours as needed for nausea or vomiting.    [provider]  Pollen Extracts (PROSTAT PO) Take 50 mLs by mouth daily.    [provider]  vitamin B-12 (CYANOCOBALAMIN) 500 MCG tablet Take 500 mcg by mouth daily.    [provider]  Allergies Esomeprazole magnesium and Eggs or egg-derived products  Family History  Problem Relation Age of Onset   Hypertension Mother    Hyperlipidemia Mother    Diabetes Mother     Social History Social History   Tobacco Use   Smoking status: Former    Packs/day: 1.00    Years: 12.00    Pack years: 12.00    Types: Cigarettes    Quit date: 02/23/1998    Years since quitting: 22.5   Smokeless tobacco: Never  Vaping Use   Vaping Use: Never used  Substance Use Topics   Alcohol use: Not Currently    Alcohol/week: 0.0 standard drinks    Comment: Past heavy drinker   Drug use: No    Review of Systems  Constitutional: No fever. Eyes: No visual changes. ENT: No sore throat. Cardiovascular: Denies chest pain. Respiratory: Denies shortness of breath. Gastrointestinal: No vomiting or diarrhea.  Genitourinary: Negative for dysuria.  Musculoskeletal: Negative for acute back pain. Skin: Negative for rash. Neurological: Negative for headaches.  Positive for  left arm numbness.  No weakness.   ____________________________________________   PHYSICAL EXAM:  VITAL SIGNS: ED Triage Vitals  Enc Vitals Group     BP 08/27/20 1350 (!) 144/107     Pulse Rate 08/27/20 1350 76     Resp 08/27/20 1350 18     Temp 08/27/20 1350 98.2 F (36.8 C)     Temp src --      SpO2 08/27/20 1350 98 %     Weight --      Height --      Head Circumference --      Peak Flow --      Pain Score 08/27/20 1346 0     Pain Loc --      Pain Edu? --      Excl. in [place]? --     Constitutional: Alert and oriented.  Relatively well appearing and in no acute distress. Eyes: Conjunctivae are normal.  EOMI.  PERRLA. Head: Atraumatic. Nose: No congestion/rhinnorhea. Mouth/Throat: Mucous membranes are moist.   Neck: Normal range of motion.  Cardiovascular: Normal rate, regular rhythm. Good peripheral circulation. Respiratory: Normal respiratory effort.  No retractions.  Gastrointestinal: No distention.  Musculoskeletal: No lower extremity edema.  Extremities warm and well perfused.  2+ radial pulse to left arm.  Normal cap refill. Neurologic:  Normal speech and language.  5/5 motor strength to bilateral upper and lower extremities.  Normal coordination with no ataxia.  No pronator drift.  Subjective numbness to left hand and arm. Skin:  Skin is warm and dry. No rash noted. Psychiatric: Mood and affect are normal. Speech and behavior are normal.  ____________________________________________   LABS (all labs ordered are listed, but only abnormal results are displayed)  Labs Reviewed  APTT - Abnormal; Notable for the following components:      Result Value   aPTT 39 (*)    All other components within normal limits  CBC - Abnormal; Notable for the following components:   RBC 3.58 (*)    Hemoglobin 9.3 (*)    HCT 30.7 (*)    RDW 17.5 (*)    Platelets 414 (*)    All other components within normal limits  COMPREHENSIVE METABOLIC PANEL - Abnormal; Notable for the  following components:   CO2 20 (*)    BUN 40 (*)    Creatinine, Ser 2.83 (*)    AST 12 (*)    Total Bilirubin 1.6 (*)  GFR, Estimated 25 (*)    All other components within normal limits  PROTIME-INR  DIFFERENTIAL  I-STAT CREATININE, ED  CBG MONITORING, ED   ____________________________________________  EKG  ED ECG REPORT I, Arta Silence, the attending physician, personally viewed and interpreted this ECG.  Date: 08/27/2020 EKG Time: 1347 Rate: 71 Rhythm: normal sinus rhythm QRS Axis: normal Intervals: normal ST/T Wave abnormalities: Nonspecific ST abnormalities Narrative Interpretation: Nonspecific abnormalities with no evidence of acute ischemia; no significant change compared to EKG of 07/20/2020  ____________________________________________  RADIOLOGY  CT head: No ICH or other acute abnormality MR cervical spine:  1. Small foci of increased T2 signal within the right hemicord at C2  and C3 are nonspecific, but could reflect demyelinating disease.  2. Mild multilevel degenerative changes of the cervical spine as  described above. Small left paracentral disc protrusion at C7-T1  contacting and flattening the left ventral cord. No high-grade  stenosis or impingement.   ____________________________________________   PROCEDURES  Procedure(s) performed: No  Procedures  Critical Care performed: No ____________________________________________   INITIAL IMPRESSION / ASSESSMENT AND PLAN / ED COURSE  Pertinent labs & imaging results that were available during my care of the patient were reviewed by me and considered in my medical decision making (see chart for details).   55 year old male with PMH as noted above including CHF, CKD, GI bleed, paroxysmal atrial fibrillation, hypertension, and CVA presents with an acute exacerbation of chronic left arm paresthesias that he has had for several months.  He reports that the current episode has lasted somewhat  longer than prior episodes.  He denies any associated weakness, and states that the paresthesias are painful.  He is taking gabapentin without relief.  I reviewed the past medical records in [place].  The patient was most recently admitted in June with a GI bleed.  Previously he has been seen in the ED in April of this year with a similar presentation of left arm paresthesias which had already been chronic at that time.  He was started on gabapentin although states that it is not helping.  He also had a CT and MRI of the brain at that time which were negative for acute findings.  On exam, the patient is overall well-appearing.  His vital signs are normal.  Physical exam is unremarkable except for left arm subjective decreased sensation.  The patient has no motor deficits or ataxia.  Overall presentation is most consistent with cervical radiculopathy or peripheral neuropathy.  Given the chronic nature of the symptoms, there is no evidence of acute stroke or TIA.  CT head was obtained from triage and shows no acute abnormalities.  The lab work-up is also unremarkable for the patient.  I consulted Dr. Curly Shores from neurology who recommends obtaining an MRI of the cervical spine and if this is negative for acute findings he can follow-up as an outpatient with neurology.  ----------------------------------------- 7:08 PM on 08/27/2020 -----------------------------------------  MRI of the cervical spine shows small foci of increased T2 signal which could indicate demyelination although this does not really fit the clinical picture.  It shows an area of disc protrusion causing flattening of the left ventral cord which is more consistent with the patient's symptoms.  I discussed again with Dr. Curly Shores from neurology who recommends neurosurgery consultation.  I then discussed the case with Dr. Lacinda Axon from neurosurgery who recommends a steroid course and outpatient follow-up in his office.  I counseled the patient on  the results of the work-up.  He feels comfortable and wants to go home.  I will prescribe a 5-day course of prednisone as well as a small quantity of tramadol for breakthrough pain.  I gave him the neurosurgery referral.  Return precautions given, and he expresses understanding.  ____________________________________________   FINAL CLINICAL IMPRESSION(S) / ED DIAGNOSES  Final diagnoses:  Cervical radiculopathy      NEW MEDICATIONS STARTED DURING THIS VISIT:  New Prescriptions   PREDNISONE (DELTASONE) 20 MG TABLET    Take 2 tablets (40 mg total) by mouth daily with breakfast for 5 days.   TRAMADOL (ULTRAM) 50 MG TABLET    Take 1 tablet (50 mg total) by mouth every 6 (six) hours as needed.     Note:  This document was prepared using Dragon voice recognition software and may include unintentional dictation errors.    Arta Silence, MD 08/27/20 1910

## 2020-08-27 NOTE — ED Triage Notes (Signed)
Pt come with c/o right sided arm numbness and bilateral leg tingling. Pt states shooting down legs. Pt states hx of pain in legs.  Pt out of meds and hasn't had them filled.   EMS reports BP-157/112, CBG-87  Pt denies any dizziness, weakness or blurry vision. Pt states it is just numbness. Pt does have hx of stroke with left arm numbness.

## 2020-08-27 NOTE — Discharge Instructions (Addendum)
Take the prednisone as prescribed daily for the next 5 days and finish the full course.  You may take the tramadol as needed for breakthrough pain over the next few days.  Follow-up with the neurosurgeon in the next several weeks.  Return to the ER for new, worsening, or persistent severe numbness, pain, weakness, inability to grip anything with the arm, or any other new or worsening symptoms that concern you.

## 2020-08-27 NOTE — ED Notes (Signed)
Pt speaking to MRI at this time

## 2020-08-27 NOTE — ED Notes (Signed)
Pt at MRI

## 2020-08-27 NOTE — ED Notes (Signed)
E signature pad not working. Pt verbalized understanding of d/c instructions, prescriptions and follow up care. Pt wheeled out of ED via wheelchair.

## 2020-09-05 ENCOUNTER — Ambulatory Visit: Payer: Medicaid Other | Admitting: Family

## 2020-09-12 ENCOUNTER — Emergency Department: Payer: Medicaid Other

## 2020-09-12 ENCOUNTER — Inpatient Hospital Stay
Admission: EM | Admit: 2020-09-12 | Discharge: 2020-09-16 | DRG: 291 | Disposition: A | Discharge status: Home / Self Care | Payer: Medicaid Other | Attending: Internal Medicine | Admitting: Internal Medicine

## 2020-09-12 DIAGNOSIS — I2510 Atherosclerotic heart disease of native coronary artery without angina pectoris: Secondary | ICD-10-CM | POA: Diagnosis present

## 2020-09-12 DIAGNOSIS — E785 Hyperlipidemia, unspecified: Secondary | ICD-10-CM | POA: Diagnosis present

## 2020-09-12 DIAGNOSIS — Z8616 Personal history of COVID-19: Secondary | ICD-10-CM

## 2020-09-12 DIAGNOSIS — Z91012 Allergy to eggs: Secondary | ICD-10-CM | POA: Diagnosis not present

## 2020-09-12 DIAGNOSIS — Z87891 Personal history of nicotine dependence: Secondary | ICD-10-CM | POA: Diagnosis not present

## 2020-09-12 DIAGNOSIS — R0609 Other forms of dyspnea: Secondary | ICD-10-CM | POA: Diagnosis not present

## 2020-09-12 DIAGNOSIS — E669 Obesity, unspecified: Secondary | ICD-10-CM | POA: Diagnosis present

## 2020-09-12 DIAGNOSIS — E039 Hypothyroidism, unspecified: Secondary | ICD-10-CM | POA: Diagnosis present

## 2020-09-12 DIAGNOSIS — Z8249 Family history of ischemic heart disease and other diseases of the circulatory system: Secondary | ICD-10-CM

## 2020-09-12 DIAGNOSIS — I43 Cardiomyopathy in diseases classified elsewhere: Secondary | ICD-10-CM | POA: Diagnosis present

## 2020-09-12 DIAGNOSIS — I480 Paroxysmal atrial fibrillation: Secondary | ICD-10-CM | POA: Diagnosis not present

## 2020-09-12 DIAGNOSIS — I130 Hypertensive heart and chronic kidney disease with heart failure and stage 1 through stage 4 chronic kidney disease, or unspecified chronic kidney disease: Secondary | ICD-10-CM | POA: Diagnosis present

## 2020-09-12 DIAGNOSIS — N1832 Chronic kidney disease, stage 3b: Secondary | ICD-10-CM | POA: Diagnosis present

## 2020-09-12 DIAGNOSIS — I5041 Acute combined systolic (congestive) and diastolic (congestive) heart failure: Secondary | ICD-10-CM | POA: Diagnosis not present

## 2020-09-12 DIAGNOSIS — I10 Essential (primary) hypertension: Secondary | ICD-10-CM | POA: Diagnosis not present

## 2020-09-12 DIAGNOSIS — Z79899 Other long term (current) drug therapy: Secondary | ICD-10-CM

## 2020-09-12 DIAGNOSIS — I161 Hypertensive emergency: Secondary | ICD-10-CM | POA: Diagnosis present

## 2020-09-12 DIAGNOSIS — E1122 Type 2 diabetes mellitus with diabetic chronic kidney disease: Secondary | ICD-10-CM | POA: Diagnosis present

## 2020-09-12 DIAGNOSIS — R079 Chest pain, unspecified: Secondary | ICD-10-CM | POA: Diagnosis not present

## 2020-09-12 DIAGNOSIS — I5043 Acute on chronic combined systolic (congestive) and diastolic (congestive) heart failure: Secondary | ICD-10-CM | POA: Diagnosis present

## 2020-09-12 DIAGNOSIS — Z23 Encounter for immunization: Secondary | ICD-10-CM

## 2020-09-12 DIAGNOSIS — N179 Acute kidney failure, unspecified: Secondary | ICD-10-CM | POA: Diagnosis not present

## 2020-09-12 DIAGNOSIS — G4733 Obstructive sleep apnea (adult) (pediatric): Secondary | ICD-10-CM | POA: Diagnosis present

## 2020-09-12 DIAGNOSIS — Z6832 Body mass index (BMI) 32.0-32.9, adult: Secondary | ICD-10-CM

## 2020-09-12 DIAGNOSIS — T502X5A Adverse effect of carbonic-anhydrase inhibitors, benzothiadiazides and other diuretics, initial encounter: Secondary | ICD-10-CM | POA: Diagnosis not present

## 2020-09-12 DIAGNOSIS — Z888 Allergy status to other drugs, medicaments and biological substances status: Secondary | ICD-10-CM | POA: Diagnosis not present

## 2020-09-12 DIAGNOSIS — Z79890 Hormone replacement therapy (postmenopausal): Secondary | ICD-10-CM

## 2020-09-12 DIAGNOSIS — Z8673 Personal history of transient ischemic attack (TIA), and cerebral infarction without residual deficits: Secondary | ICD-10-CM

## 2020-09-12 DIAGNOSIS — I248 Other forms of acute ischemic heart disease: Secondary | ICD-10-CM | POA: Diagnosis present

## 2020-09-12 DIAGNOSIS — Z833 Family history of diabetes mellitus: Secondary | ICD-10-CM

## 2020-09-12 DIAGNOSIS — Z5329 Procedure and treatment not carried out because of patient's decision for other reasons: Secondary | ICD-10-CM | POA: Diagnosis not present

## 2020-09-12 DIAGNOSIS — I5082 Biventricular heart failure: Secondary | ICD-10-CM

## 2020-09-12 DIAGNOSIS — Z83438 Family history of other disorder of lipoprotein metabolism and other lipidemia: Secondary | ICD-10-CM

## 2020-09-12 DIAGNOSIS — Z20822 Contact with and (suspected) exposure to covid-19: Secondary | ICD-10-CM | POA: Diagnosis present

## 2020-09-12 DIAGNOSIS — Y92239 Unspecified place in hospital as the place of occurrence of the external cause: Secondary | ICD-10-CM | POA: Diagnosis not present

## 2020-09-12 DIAGNOSIS — I509 Heart failure, unspecified: Secondary | ICD-10-CM | POA: Diagnosis not present

## 2020-09-12 DIAGNOSIS — I420 Dilated cardiomyopathy: Secondary | ICD-10-CM | POA: Diagnosis not present

## 2020-09-12 DIAGNOSIS — I160 Hypertensive urgency: Secondary | ICD-10-CM | POA: Diagnosis not present

## 2020-09-12 DIAGNOSIS — R778 Other specified abnormalities of plasma proteins: Secondary | ICD-10-CM | POA: Diagnosis not present

## 2020-09-12 DIAGNOSIS — I110 Hypertensive heart disease with heart failure: Secondary | ICD-10-CM | POA: Diagnosis not present

## 2020-09-12 DIAGNOSIS — I493 Ventricular premature depolarization: Secondary | ICD-10-CM | POA: Diagnosis present

## 2020-09-12 DIAGNOSIS — Z9114 Patient's other noncompliance with medication regimen: Secondary | ICD-10-CM

## 2020-09-12 DIAGNOSIS — N189 Chronic kidney disease, unspecified: Secondary | ICD-10-CM

## 2020-09-12 LAB — BASIC METABOLIC PANEL
Anion gap: 10 (ref 5–15)
BUN: 29 mg/dL — ABNORMAL HIGH (ref 6–20)
CO2: 19 mmol/L — ABNORMAL LOW (ref 22–32)
Calcium: 8.8 mg/dL — ABNORMAL LOW (ref 8.9–10.3)
Chloride: 108 mmol/L (ref 98–111)
Creatinine, Ser: 2.53 mg/dL — ABNORMAL HIGH (ref 0.61–1.24)
GFR, Estimated: 29 mL/min — ABNORMAL LOW (ref >60)
Glucose, Bld: 101 mg/dL — ABNORMAL HIGH (ref 70–99)
Potassium: 4.7 mmol/L (ref 3.5–5.1)
Sodium: 137 mmol/L (ref 135–145)

## 2020-09-12 LAB — CBC
HCT: 30.3 % — ABNORMAL LOW (ref 39.0–52.0)
Hemoglobin: 9.3 g/dL — ABNORMAL LOW (ref 13.0–17.0)
MCH: 24.5 pg — ABNORMAL LOW (ref 26.0–34.0)
MCHC: 30.7 g/dL (ref 30.0–36.0)
MCV: 79.9 fL — ABNORMAL LOW (ref 80.0–100.0)
Platelets: 297 10*3/uL (ref 150–400)
RBC: 3.79 MIL/uL — ABNORMAL LOW (ref 4.22–5.81)
RDW: 18.7 % — ABNORMAL HIGH (ref 11.5–15.5)
WBC: 10.3 10*3/uL (ref 4.0–10.5)
nRBC: 0.2 % (ref 0.0–0.2)

## 2020-09-12 LAB — TROPONIN I (HIGH SENSITIVITY)
Troponin I (High Sensitivity): 54 ng/L — ABNORMAL HIGH (ref <18)
Troponin I (High Sensitivity): 43 ng/L — ABNORMAL HIGH (ref <18)

## 2020-09-12 LAB — BRAIN NATRIURETIC PEPTIDE: B Natriuretic Peptide: 3375 pg/mL — ABNORMAL HIGH (ref 0.0–100.0)

## 2020-09-12 MED ORDER — HEPARIN SODIUM (PORCINE) 5000 UNIT/ML IJ SOLN
5000.0000 [IU] | Freq: Three times a day (TID) | INTRAMUSCULAR | Status: DC
  Administered 2020-09-12 – 2020-09-13 (×4): 5000 [IU] via SUBCUTANEOUS
  Filled 2020-09-12 (×5): qty 1

## 2020-09-12 MED ORDER — SODIUM CHLORIDE 0.9 % IV SOLN: 250.0000 mL | INTRAVENOUS | Status: DC | PRN

## 2020-09-12 MED ORDER — SODIUM CHLORIDE 0.9% FLUSH
3.0000 mL | Freq: Two times a day (BID) | INTRAVENOUS | Status: DC
  Administered 2020-09-12 – 2020-09-15 (×6): 3 mL via INTRAVENOUS

## 2020-09-12 MED ORDER — FUROSEMIDE 10 MG/ML IJ SOLN
40.0000 mg | Freq: Two times a day (BID) | INTRAMUSCULAR | Status: DC
Start: 2020-09-12 — End: 2020-09-14
  Administered 2020-09-12 – 2020-09-14 (×4): 40 mg via INTRAVENOUS
  Filled 2020-09-12 (×5): qty 4

## 2020-09-12 MED ORDER — ACETAMINOPHEN 325 MG PO TABS
650.0000 mg | ORAL_TABLET | ORAL | Status: DC | PRN
  Administered 2020-09-13: 650 mg via ORAL
  Filled 2020-09-12 (×2): qty 2

## 2020-09-12 MED ORDER — SODIUM CHLORIDE 0.9% FLUSH: 3.0000 mL | INTRAVENOUS | Status: DC | PRN

## 2020-09-12 MED ORDER — FUROSEMIDE 10 MG/ML IJ SOLN
40.0000 mg | Freq: Once | INTRAMUSCULAR | Status: AC
  Administered 2020-09-12: 40 mg via INTRAVENOUS
  Filled 2020-09-12: qty 4

## 2020-09-12 MED ORDER — ONDANSETRON HCL 4 MG/2ML IJ SOLN: 4.0000 mg | Freq: Four times a day (QID) | INTRAMUSCULAR | Status: DC | PRN

## 2020-09-12 MED ORDER — HYDRALAZINE HCL 20 MG/ML IJ SOLN
10.0000 mg | INTRAMUSCULAR | Status: DC | PRN
  Administered 2020-09-13: 10 mg via INTRAVENOUS
  Filled 2020-09-12: qty 1

## 2020-09-12 MED ORDER — LABETALOL HCL 5 MG/ML IV SOLN
10.0000 mg | Freq: Once | INTRAVENOUS | Status: AC
  Administered 2020-09-12: 10 mg via INTRAVENOUS
  Filled 2020-09-12: qty 4

## 2020-09-12 NOTE — ED Notes (Signed)
Patient provided with beverage per request.

## 2020-09-12 NOTE — ED Provider Notes (Signed)
[facility] Emergency Department Provider Note   ____________________________________________    I have reviewed the triage vital signs and the nursing notes.   HISTORY  Chief Complaint Chest Pain     HPI Hector [name] is a 55 y.o. male with extensive past medical history as detailed below including CHF, CKD, chronic,elevated troponin, paroxysmal atrial fibrillation who presents with complaints of shortness of breath.  He also reports chest pain.  Notes over the last several days his breathing has worsened, he becomes markedly short of breath with ambulation of very short distances.  He reports chest pain along the left side of his chest occasionally radiating to his neck.  Fevers or chills reported.  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 12/2007 MV: EF 28%, no isch;  b. 8/12 Echo: EF 25-35%; c. 02/2014 Echo: EF 20-25%; d. 12/2014 Cath: minimal CAD; e. 01/2015 Echo: EF 50-55%;  d. 05/2016 Echo: EF 30-35%, diff HK, gr2 DD; e. 11/2016 Echo: EF 45-50%, diff HK.   Chronic combined systolic (congestive) and diastolic (congestive) heart failure ([place])    a. 05/2016 Most recent Echo: EF 30-35%, diff HK, Gr2 DD, mild MR, mod dil LA; b. 11/2016 Echo: EF 45-50%, diff HK.   CKD (chronic kidney disease), stage III (HCC)    Elevated troponin    Chronically elevated. - level was 0.17-0.10 during recent admission   Essential hypertension    GI bleed 11/2013   Hyperlipidemia    Pancreatitis    Paroxysmal A-fib (HCC)    a. new onset s/p unsuccessful TEE/DCCV on 08/16/2014; b. on amio/eliquis (CHA2DS2VASc = 2-3); c. reports 1-2 hrs of afib ~ q51mo.   Paroxysmal atrial flutter ([place]    a. new onset 07/2014; b. s/p unsuccessful TEE/DCCV 08/16/2014; c. on apixaban.   Sleep-disordered breathing    Has yet to have a sleep study   Stroke ([facility]     Patient  Active Problem List   Diagnosis Date Noted   CKD (chronic kidney disease) stage 4, GFR 15-29 ml/min ([place] 07/21/2020   GIB (gastrointestinal bleeding) 07/21/2020   Acute blood loss anemia 07/21/2020   Hyperthyroidism without thyroid nodule 02/03/2020   Acute respiratory distress syndrome (ARDS) due to COVID-19 virus ([place] 12/31/2019   Acute renal failure superimposed on stage 3b chronic kidney disease ([place] 12/31/2019   Hyperkalemia 12/31/2019   Hyponatremia 12/31/2019   Obstructive sleep apnea 04/21/2016   Nonischemic cardiomyopathy ([place] 05/26/2015   Chronic combined systolic (congestive) and diastolic (congestive) heart failure ([place] 04/01/2015   Essential hypertension 04/01/2015   Sinus bradycardia 01/29/2015   Paroxysmal atrial fibrillation ([place] 08/16/2014   Paroxysmal atrial flutter ([place] 08/14/2014    Class: Acute   Chronic kidney disease (CKD), stage III (moderate) ([place] [place] 07/02/2014   Hypertensive heart disease    Hyperlipidemia     Past Surgical History:  Procedure Laterality Date   CARDIAC CATHETERIZATION N/A 01/09/2015   Procedure: Left Heart Cath and Coronary Angiography;  Surgeon: DLeonie Man MD;  [facility];  Service: Cardiovascular;  Laterality: N/A;   COLONOSCOPY N/A 07/22/2020   Procedure: COLONOSCOPY;  Surgeon: Toledo, TBenay Pike MD;  Location: [facility] ENDOSCOPY;  Service: Gastroenterology;  Laterality: N/A;   ELECTROPHYSIOLOGIC STUDY N/A 08/16/2014   Procedure: CARDIOVERSION;  Surgeon: TMinna Merritts MD;  Location: [facility] ORS;  Service: Cardiovascular;  Laterality:  N/A;   FLEXIBLE SIGMOIDOSCOPY N/A 10/10/2015   Procedure: FLEXIBLE SIGMOIDOSCOPY;  Surgeon: [name], MD;  Location: [facility] ENDOSCOPY;  Service: Endoscopy;  Laterality: N/A;   KNEE SURGERY Right    NM MYOVIEW LTD  November 2011   No ischemia or infarction. EF 50-55% ( no improvement from 2009 Myoview EF of 28%   TEE WITHOUT CARDIOVERSION N/A 08/16/2014   Procedure:  TRANSESOPHAGEAL ECHOCARDIOGRAM (TEE);  Surgeon: [name], MD;  Location: [facility] ORS;  Service: Cardiovascular;  Laterality: N/A;    Prior to Admission medications   Medication Sig Start Date End Date Taking? Authorizing Provider  allopurinol (ZYLOPRIM) 100 MG tablet Take 100 mg by mouth daily as needed.    [provider]  amiodarone (PACERONE) 200 MG tablet Take 1 tablet (200 mg total) by mouth daily. 01/03/18   Alisa Graff, FNP  atorvastatin (LIPITOR) 80 MG tablet Take 1 tablet (80 mg total) by mouth daily. 03/15/18   Alisa Graff, FNP  carvedilol (COREG) 6.25 MG tablet Take 1 tablet (6.25 mg total) by mouth 2 (two) times daily with a meal. 02/07/20   Pokhrel, Laxman, MD  docusate sodium (COLACE) 100 MG capsule Take 100 mg by mouth daily.    [provider]  famotidine (PEPCID) 40 MG tablet Take 0.5 tablets (20 mg total) by mouth 2 (two) times daily. 07/25/20   Max Sane, MD  ferrous gluconate (FERGON) 324 MG tablet Take 324 mg by mouth daily with breakfast.    [provider]  furosemide (LASIX) 40 MG tablet Take 1 tablet (40 mg total) by mouth 2 (two) times daily for 15 days. 06/18/20 07/03/20  Naaman Plummer, MD  gabapentin (NEURONTIN) 100 MG capsule Take 2 capsules (200 mg total) by mouth 3 (three) times daily. 06/20/20 08/19/20  Menshew, Dannielle Karvonen, PA-C  hydrALAZINE (APRESOLINE) 25 MG tablet Take 1 tablet (25 mg total) by mouth 4 (four) times daily. 06/18/20 07/18/20  Naaman Plummer, MD  levothyroxine (SYNTHROID) 25 MCG tablet Take 25 mcg by mouth daily before breakfast.    [provider]  Menthol, Topical Analgesic, (BIOFREEZE ROLL-ON COLORLESS) 4 % GEL Apply 1 application topically daily.    [provider]  Nutritional Supplements (FEEDING SUPPLEMENT, NEPRO CARB STEADY,) LIQD Take 237 mLs by mouth 3 (three) times daily between meals. 01/19/20   Enzo Bi, MD  ondansetron (ZOFRAN) 4 MG tablet Take 4 mg by mouth every 8 (eight)  hours as needed for nausea or vomiting.    [provider]  Pollen Extracts (PROSTAT PO) Take 50 mLs by mouth daily.    [provider]  traMADol (ULTRAM) 50 MG tablet Take 1 tablet (50 mg total) by mouth every 6 (six) hours as needed. 08/27/20 08/27/21  Arta Silence, MD  vitamin B-12 (CYANOCOBALAMIN) 500 MCG tablet Take 500 mcg by mouth daily.    [provider]     Allergies Esomeprazole magnesium and Eggs or egg-derived products  Family History  Problem Relation Age of Onset   Hypertension Mother    Hyperlipidemia Mother    Diabetes Mother     Social History Social History   Tobacco Use   Smoking status: Former    Packs/day: 1.00    Years: 12.00    Pack years: 12.00    Types: Cigarettes    Quit date: 02/23/1998    Years since quitting: 22.5   Smokeless tobacco: Never  Vaping Use   Vaping Use: Never used  Substance  Use Topics   Alcohol use: Not Currently    Alcohol/week: 0.0 standard drinks    Comment: Past heavy drinker   Drug use: No    Review of Systems  Constitutional: No fever/chills Eyes: No visual changes.  ENT: No sore throat. Cardiovascular: As above Respiratory: As above Gastrointestinal: No abdominal pain.  No nausea, no vomiting.   Genitourinary: Negative for dysuria. Musculoskeletal: Negative for back pain. Skin: Negative for rash. Neurological: Negative for headaches or weakness   ____________________________________________   PHYSICAL EXAM:  VITAL SIGNS: ED Triage Vitals [09/12/20 1320]  Enc Vitals Group     BP (!) 166/136     Pulse Rate 87     Resp 18     Temp (!) 97.5 F (36.4 C)     Temp src      SpO2 99 %     Weight      Height      Head Circumference      Peak Flow      Pain Score      Pain Loc      Pain Edu?      Excl. in [place]?     Constitutional: Alert and oriented. No acute distress.   Nose: No congestion/rhinnorhea. Mouth/Throat: Mucous membranes are moist.    Cardiovascular: Normal  rate, regular rhythm. Grossly normal heart sounds.  Good peripheral circulation. Respiratory: Increased respiratory effort with tachypnea.  No retractions.  Bibasilar Rales Gastrointestinal: Soft and nontender. No distention.    Musculoskeletal: 1+ edema bilaterally Neurologic:  Normal speech and language. No gross focal neurologic deficits are appreciated.  Skin:  Skin is warm, dry and intact. No rash noted. Psychiatric: Mood and affect are normal. Speech and behavior are normal.  ____________________________________________   LABS (all labs ordered are listed, but only abnormal results are displayed)  Labs Reviewed  BASIC METABOLIC PANEL - Abnormal; Notable for the following components:      Result Value   CO2 19 (*)    Glucose, Bld 101 (*)    BUN 29 (*)    Creatinine, Ser 2.53 (*)    Calcium 8.8 (*)    GFR, Estimated 29 (*)    All other components within normal limits  CBC - Abnormal; Notable for the following components:   RBC 3.79 (*)    Hemoglobin 9.3 (*)    HCT 30.3 (*)    MCV 79.9 (*)    MCH 24.5 (*)    RDW 18.7 (*)    All other components within normal limits  TROPONIN I (HIGH SENSITIVITY) - Abnormal; Notable for the following components:   Troponin I (High Sensitivity) 54 (*)    All other components within normal limits  SARS CORONAVIRUS 2 (TAT 6-24 HRS)  BRAIN NATRIURETIC PEPTIDE  TROPONIN I (HIGH SENSITIVITY)   ____________________________________________  EKG  ED ECG REPORT I, Lavonia Drafts, the attending physician, personally viewed and interpreted this ECG.  Date: 09/12/2020  Rhythm: normal sinus rhythm QRS Axis: normal Intervals: QT prolonged ST/T Wave abnormalities: normal Narrative Interpretation: no evidence of acute ischemia  ____________________________________________  RADIOLOGY  Chest x-ray reviewed by me ____________________________________________   PROCEDURES  Procedure(s) performed: No  Procedures   Critical Care  performed: No ____________________________________________   INITIAL IMPRESSION / ASSESSMENT AND PLAN / ED COURSE  Pertinent labs & imaging results that were available during my care of the patient were reviewed by me and considered in my medical decision making (see chart for details).   Patient with extensive  past medical history off medications for several months presents with shortness of breath.  He is notably tachypneic with short ambulation to the bathroom.  Lab work notable for chronic kidney disease, chronically elevated troponin of 54, chronic mild anemia of 9.3.  Significantly hypertensive and combined with his tachypnea concerning for CHF exacerbation.  Will treat with IV Lasix, IV labetalol.  Have discussed with the hospitalist for admission for diuresis, blood pressure control.    ____________________________________________   FINAL CLINICAL IMPRESSION(S) / ED DIAGNOSES  Final diagnoses:  Acute on chronic congestive heart failure, unspecified heart failure type ([place])  Chronic kidney disease, unspecified CKD stage        Note:  This document was prepared using Dragon voice recognition software and may include unintentional dictation errors.    Lavonia Drafts, MD 09/12/20 [phone]

## 2020-09-12 NOTE — H&P (Addendum)
History and Physical    Hector [name] [id number] DOB: [birth year] DOA: 09/12/2020  PCP: [name], MD  Chief Complaint: Chest pain, shortness of breath, lower extremity edema  HPI: Hector [name] is a 55 y.o. male with a past medical history of CKD 4, nonischemic cardiomyopathy, obstructive sleep apnea noncompliant on CPAP, essential hypertension, paroxysmal atrial fibrillation not on anticoagulation, CHF with mildly reduced ejection fraction of 45 to 50% based off echo on 12/21.  Patient presents to the emergency department with sudden onset of chest pain and shortness of breath that woke him up this morning.  The chest pain is located on the left side but he states he also has some left arm pain that originates from his neck. Denies any orthopnea.  Endorses significant dyspnea on exertion and paroxysmal nocturnal dyspnea.  He has had worsening lower extremity edema.  Cough.  No fevers or chills.  No abdominal complaints.  No urinary complaints.  States he has not been taking any medications since March.  In the emergency department his vital signs are stable his blood pressure is elevated with systolics in the [id number] and diastolics in the [id number].  BNP elevated at 3300.  Chest x-ray shows vascular congestion.  Initial troponin is 54 but appears that his troponins are chronically elevated.  Creatinine 2.53 which appears to be around his baseline.  Hemoglobin 9.3 and hematocrit 30.3. Surprisingly he is saturating well on room air at 98%.    ED Course: Lab work and imaging obtained.  Chest x-ray shows vascular congestion.  Been given labetalol 10 mg IV x1 and Lasix 40 mg IV x1.  Review of Systems: 14 point review of systems is negative except for what is mentioned above in the HPI.   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 12/2007 MV: EF 28%, no isch;  b. 8/12 Echo: EF 25-35%; c. 02/2014 Echo: EF 20-25%; d. 12/2014 Cath:  minimal CAD; e. 01/2015 Echo: EF 50-55%;  d. 05/2016 Echo: EF 30-35%, diff HK, gr2 DD; e. 11/2016 Echo: EF 45-50%, diff HK.   Chronic combined systolic (congestive) and diastolic (congestive) heart failure ([place])    a. 05/2016 Most recent Echo: EF 30-35%, diff HK, Gr2 DD, mild MR, mod dil LA; b. 11/2016 Echo: EF 45-50%, diff HK.   CKD (chronic kidney disease), stage III (HCC)    Elevated troponin    Chronically elevated. - level was 0.17-0.10 during recent admission   Essential hypertension    GI bleed 11/2013   Hyperlipidemia    Pancreatitis    Paroxysmal A-fib (HCC)    a. new onset s/p unsuccessful TEE/DCCV on 08/16/2014; b. on amio/eliquis (CHA2DS2VASc = 2-3); c. reports 1-2 hrs of afib ~ q63mo.   Paroxysmal atrial flutter ([place]    a. new onset 07/2014; b. s/p unsuccessful TEE/DCCV 08/16/2014; c. on apixaban.   Sleep-disordered breathing    Has yet to have a sleep study   Stroke ([facility]     Past Surgical History:  Procedure Laterality Date   CARDIAC CATHETERIZATION N/A 01/09/2015   Procedure: Left Heart Cath and Coronary Angiography;  Surgeon: DLeonie Man MD;  [facility];  Service: Cardiovascular;  Laterality: N/A;   COLONOSCOPY N/A 07/22/2020   Procedure: COLONOSCOPY;  Surgeon: Toledo, TBenay Pike MD;  Location: [facility] ENDOSCOPY;  Service: Gastroenterology;  Laterality: N/A;   ELECTROPHYSIOLOGIC STUDY N/A 08/16/2014   Procedure: CARDIOVERSION;  Surgeon: TChristia Reading  Gloriajean Dell, MD;  Location: [facility] ORS;  Service: Cardiovascular;  Laterality: N/A;   FLEXIBLE SIGMOIDOSCOPY N/A 10/10/2015   Procedure: FLEXIBLE SIGMOIDOSCOPY;  Surgeon: [name], MD;  Location: [facility] ENDOSCOPY;  Service: Endoscopy;  Laterality: N/A;   KNEE SURGERY Right    NM MYOVIEW LTD  November 2011   No ischemia or infarction. EF 50-55% ( no improvement from 2009 Myoview EF of 28%   TEE WITHOUT CARDIOVERSION N/A 08/16/2014   Procedure: TRANSESOPHAGEAL ECHOCARDIOGRAM (TEE);  Surgeon: [name], MD;   Location: [facility] ORS;  Service: Cardiovascular;  Laterality: N/A;    Social History   Socioeconomic History   Marital status: Single    Spouse name: Not on file   Number of children: Not on file   Years of education: Not on file   Highest education level: Not on file  Occupational History   Not on file  Tobacco Use   Smoking status: Former    Packs/day: 1.00    Years: 12.00    Pack years: 12.00    Types: Cigarettes    Quit date: 02/23/1998    Years since quitting: 22.5   Smokeless tobacco: Never  Vaping Use   Vaping Use: Never used  Substance and Sexual Activity   Alcohol use: Not Currently    Alcohol/week: 0.0 standard drinks    Comment: Past heavy drinker   Drug use: No   Sexual activity: Not Currently  Other Topics Concern   Not on file  Social History Narrative   Not on file   Social Determinants of Health   Financial Resource Strain: Not on file  Food Insecurity: Not on file  Transportation Needs: Not on file  Physical Activity: Not on file  Stress: Not on file  Social Connections: Not on file  Intimate Partner Violence: Not on file    Allergies  Allergen Reactions   Esomeprazole Magnesium Other (See Comments)    Suspected interstitial nephritis 2018   Eggs Or Egg-Derived Products Rash    Family History  Problem Relation Age of Onset   Hypertension Mother    Hyperlipidemia Mother    Diabetes Mother     Prior to Admission medications   Medication Sig Start Date End Date Taking? Authorizing Provider  allopurinol (ZYLOPRIM) 100 MG tablet Take 100 mg by mouth daily as needed.    [provider]  amiodarone (PACERONE) 200 MG tablet Take 1 tablet (200 mg total) by mouth daily. 01/03/18   Alisa Graff, FNP  atorvastatin (LIPITOR) 80 MG tablet Take 1 tablet (80 mg total) by mouth daily. 03/15/18   Alisa Graff, FNP  carvedilol (COREG) 6.25 MG tablet Take 1 tablet (6.25 mg total) by mouth 2 (two) times daily with a meal. 02/07/20   Pokhrel,  Laxman, MD  docusate sodium (COLACE) 100 MG capsule Take 100 mg by mouth daily.    [provider]  famotidine (PEPCID) 40 MG tablet Take 0.5 tablets (20 mg total) by mouth 2 (two) times daily. 07/25/20   Max Sane, MD  ferrous gluconate (FERGON) 324 MG tablet Take 324 mg by mouth daily with breakfast.    [provider]  furosemide (LASIX) 40 MG tablet Take 1 tablet (40 mg total) by mouth 2 (two) times daily for 15 days. 06/18/20 07/03/20  Naaman Plummer, MD  gabapentin (NEURONTIN) 100 MG capsule Take 2 capsules (200 mg total) by mouth 3 (three) times daily. 06/20/20 08/19/20  Menshew, Dannielle Karvonen, PA-C  hydrALAZINE (APRESOLINE)  25 MG tablet Take 1 tablet (25 mg total) by mouth 4 (four) times daily. 06/18/20 07/18/20  Naaman Plummer, MD  levothyroxine (SYNTHROID) 25 MCG tablet Take 25 mcg by mouth daily before breakfast.    [provider]  Menthol, Topical Analgesic, (BIOFREEZE ROLL-ON COLORLESS) 4 % GEL Apply 1 application topically daily.    [provider]  Nutritional Supplements (FEEDING SUPPLEMENT, NEPRO CARB STEADY,) LIQD Take 237 mLs by mouth 3 (three) times daily between meals. 01/19/20   Enzo Bi, MD  ondansetron (ZOFRAN) 4 MG tablet Take 4 mg by mouth every 8 (eight) hours as needed for nausea or vomiting.    [provider]  Pollen Extracts (PROSTAT PO) Take 50 mLs by mouth daily.    [provider]  traMADol (ULTRAM) 50 MG tablet Take 1 tablet (50 mg total) by mouth every 6 (six) hours as needed. 08/27/20 08/27/21  Arta Silence, MD  vitamin B-12 (CYANOCOBALAMIN) 500 MCG tablet Take 500 mcg by mouth daily.    [provider]    Physical Exam: Vitals:   09/12/20 1320 09/12/20 1452  BP: (!) 166/136 (!) 183/131  Pulse: 87 88  Resp: 18 16  Temp: (!) 97.5 F (36.4 C)   SpO2: 99% 98%     General:  Appears calm and comfortable and is in NAD Cardiovascular:  RRR, no m/r/g.  Respiratory:   Bibasilar crackles on lung  auscultation  Abdomen:  soft, NT, ND, NABS Skin:  no rash or induration seen on limited exam Musculoskeletal:  grossly normal tone BUE/BLE, good ROM, no bony abnormality Lower extremity: 1+ pitting edema up to the knees bilaterally.  Limited foot exam with no ulcerations.  2+ distal pulses. Psychiatric:  grossly normal mood and affect, speech fluent and appropriate, AOx3 Neurologic:  CN 2-12 grossly intact, moves all extremities in coordinated fashion, sensation intact    Radiological Exams on Admission: Independently reviewed - see discussion in A/P where applicable  DG Chest 2 View  Result Date: 09/12/2020 CLINICAL DATA:  Left-sided chest pain for several hours EXAM: CHEST - 2 VIEW COMPARISON:  06/19/2020 FINDINGS: Cardiac shadow is enlarged. Lungs are well aerated bilaterally. Stable appearing vascular congestion is noted without significant edema. No focal infiltrate is noted. No bony abnormality is seen. IMPRESSION: Stable appearing vascular congestion. Electronically Signed   By: Inez Catalina M.D.   On: 09/12/2020 13:55    EKG: Independently reviewed.  NSR with rate 83   Labs on Admission: I have personally reviewed the available labs and imaging studies at the time of the admission.  Pertinent labs: BNP 3300, creatinine 2.53, initial troponin 54, hemoglobin 9.3, hematocrit 30.3     Assessment/Plan: Dyspnea on exertion and bilateral lower extremity edema secondary to acute on chronic CHF decompensation: The patient will be admitted to the cardiac progressive care unit.  We will consult cardiology.  Start strict I's and O's, low-salt diet, fluid restriction and daily weights.  Monitor urine output.  The patient has been given Lasix 40 mg IV x1 in the emergency department.  We will continue IV diuresis with Lasix 40 mg every 12 hours.  Echocardiogram will be obtained.  Defer starting guideline directed medical therapy to cardiology.  Paroxysmal atrial fibrillation: Currently in  normal sinus rhythm.  Will defer starting chronic anticoagulation for stroke prophylaxis to cardiology services.  Uncontrolled hypertension: Given labetalol 10 mg IV x1 in the emergency department.  We will order hydralazine 10 mg every 4 hours as needed IV for  high blood pressure.  Chest pain likely secondary to hypertensive urgency/emergency: Plan as above  Chronically elevated troponins: No acute ischemic changes noted on EKG.  Level of Care: Progressive cardiac floor DVT prophylaxis: Heparin subcu Code Status: Full code Consults: Cardiology Admission status: Inpatient   [place] Hospitalists   How to contact the [facility] Attending or Consulting provider [place] or covering provider during after hours [place], for this patient?  Check the care team in [facility] and look for a) attending/consulting TRH provider listed and b) the [facility] [place] team listed Log into [email or web address] and use [place]'s universal password to access. If you do not have the password, please contact the hospital operator. Locate the [facility] provider you are looking for under [facility] and page to a number that you can be directly reached. If you still have difficulty reaching the provider, please page the [facility] (Director on Call) for the Hospitalists listed on amion for assistance.   09/12/2020, 3:24 PM

## 2020-09-12 NOTE — ED Triage Notes (Signed)
Pt in via [facility] with sharp L sided CP since waking at 0800 this am, 8/10 pain. Given 324 ASA and no NTG en route. Also c/o leg neuropathy

## 2020-09-13 ENCOUNTER — Encounter: Payer: Self-pay | Admitting: Family Medicine

## 2020-09-13 ENCOUNTER — Inpatient Hospital Stay (HOSPITAL_COMMUNITY)
Admit: 2020-09-13 | Discharge: 2020-09-13 | Disposition: A | Discharge status: Home / Self Care | Payer: Medicaid Other | Attending: Cardiovascular Disease | Admitting: Cardiovascular Disease

## 2020-09-13 DIAGNOSIS — R079 Chest pain, unspecified: Secondary | ICD-10-CM

## 2020-09-13 DIAGNOSIS — R778 Other specified abnormalities of plasma proteins: Secondary | ICD-10-CM

## 2020-09-13 DIAGNOSIS — I10 Essential (primary) hypertension: Secondary | ICD-10-CM | POA: Diagnosis not present

## 2020-09-13 DIAGNOSIS — R0609 Other forms of dyspnea: Secondary | ICD-10-CM

## 2020-09-13 DIAGNOSIS — I5041 Acute combined systolic (congestive) and diastolic (congestive) heart failure: Secondary | ICD-10-CM | POA: Diagnosis not present

## 2020-09-13 DIAGNOSIS — I110 Hypertensive heart disease with heart failure: Secondary | ICD-10-CM | POA: Diagnosis not present

## 2020-09-13 DIAGNOSIS — I5043 Acute on chronic combined systolic (congestive) and diastolic (congestive) heart failure: Secondary | ICD-10-CM | POA: Diagnosis not present

## 2020-09-13 DIAGNOSIS — I420 Dilated cardiomyopathy: Secondary | ICD-10-CM

## 2020-09-13 DIAGNOSIS — I480 Paroxysmal atrial fibrillation: Secondary | ICD-10-CM | POA: Diagnosis not present

## 2020-09-13 LAB — CBC
HCT: 32.4 % — ABNORMAL LOW (ref 39.0–52.0)
Hemoglobin: 10 g/dL — ABNORMAL LOW (ref 13.0–17.0)
MCH: 24.7 pg — ABNORMAL LOW (ref 26.0–34.0)
MCHC: 30.9 g/dL (ref 30.0–36.0)
MCV: 80 fL (ref 80.0–100.0)
Platelets: 276 10*3/uL (ref 150–400)
RBC: 4.05 MIL/uL — ABNORMAL LOW (ref 4.22–5.81)
RDW: 18.9 % — ABNORMAL HIGH (ref 11.5–15.5)
WBC: 9.2 10*3/uL (ref 4.0–10.5)
nRBC: 0.2 % (ref 0.0–0.2)

## 2020-09-13 LAB — BASIC METABOLIC PANEL
Anion gap: 12 (ref 5–15)
BUN: 33 mg/dL — ABNORMAL HIGH (ref 6–20)
CO2: 19 mmol/L — ABNORMAL LOW (ref 22–32)
Calcium: 8.8 mg/dL — ABNORMAL LOW (ref 8.9–10.3)
Chloride: 106 mmol/L (ref 98–111)
Creatinine, Ser: 2.47 mg/dL — ABNORMAL HIGH (ref 0.61–1.24)
GFR, Estimated: 30 mL/min — ABNORMAL LOW (ref >60)
Glucose, Bld: 91 mg/dL (ref 70–99)
Potassium: 4.1 mmol/L (ref 3.5–5.1)
Sodium: 137 mmol/L (ref 135–145)

## 2020-09-13 LAB — ECHOCARDIOGRAM COMPLETE
AR max vel: 2.67 cm2
AV Area VTI: 2.34 cm2
AV Area mean vel: 2.43 cm2
AV Mean grad: 2 mmHg
AV Peak grad: 3.6 mmHg
Ao pk vel: 0.95 m/s
Area-P 1/2: 4.25 cm2
MV VTI: 1.14 cm2
S' Lateral: 5.16 cm

## 2020-09-13 LAB — SARS CORONAVIRUS 2 (TAT 6-24 HRS): SARS Coronavirus 2: NEGATIVE

## 2020-09-13 MED ORDER — CARVEDILOL 6.25 MG PO TABS
6.2500 mg | ORAL_TABLET | Freq: Two times a day (BID) | ORAL | Status: DC
  Administered 2020-09-13 – 2020-09-16 (×6): 6.25 mg via ORAL
  Filled 2020-09-13 (×7): qty 1

## 2020-09-13 MED ORDER — THIAMINE HCL 100 MG/ML IJ SOLN
100.0000 mg | Freq: Once | INTRAMUSCULAR | Status: AC
  Administered 2020-09-13: 100 mg via INTRAVENOUS
  Filled 2020-09-13: qty 2

## 2020-09-13 MED ORDER — NEPRO/CARBSTEADY PO LIQD
237.0000 mL | Freq: Two times a day (BID) | ORAL | Status: DC
  Administered 2020-09-13 – 2020-09-15 (×5): 237 mL via ORAL

## 2020-09-13 MED ORDER — GABAPENTIN 100 MG PO CAPS
200.0000 mg | ORAL_CAPSULE | Freq: Three times a day (TID) | ORAL | Status: DC
  Administered 2020-09-13 – 2020-09-16 (×9): 200 mg via ORAL
  Filled 2020-09-13 (×9): qty 2

## 2020-09-13 MED ORDER — PERFLUTREN LIPID MICROSPHERE
1.0000 mL | INTRAVENOUS | Status: AC | PRN
  Administered 2020-09-13: 2 mL via INTRAVENOUS
  Filled 2020-09-13: qty 10

## 2020-09-13 MED ORDER — HYDRALAZINE HCL 25 MG PO TABS
25.0000 mg | ORAL_TABLET | Freq: Three times a day (TID) | ORAL | Status: DC
  Administered 2020-09-13 – 2020-09-16 (×9): 25 mg via ORAL
  Filled 2020-09-13 (×9): qty 1

## 2020-09-13 MED ORDER — THIAMINE HCL 100 MG PO TABS
100.0000 mg | ORAL_TABLET | Freq: Every day | ORAL | Status: DC
  Administered 2020-09-14 – 2020-09-16 (×3): 100 mg via ORAL
  Filled 2020-09-13 (×3): qty 1

## 2020-09-13 MED ORDER — FOLIC ACID 1 MG PO TABS
1.0000 mg | ORAL_TABLET | Freq: Every day | ORAL | Status: DC
  Administered 2020-09-14 – 2020-09-16 (×3): 1 mg via ORAL
  Filled 2020-09-13 (×2): qty 1

## 2020-09-13 MED ORDER — ADULT MULTIVITAMIN W/MINERALS CH
1.0000 | ORAL_TABLET | Freq: Every day | ORAL | Status: DC
  Administered 2020-09-14 – 2020-09-16 (×3): 1 via ORAL
  Filled 2020-09-13 (×3): qty 1

## 2020-09-13 NOTE — Progress Notes (Signed)
PROGRESS NOTE    Hector [name]  [id number] DOB: 1965-10-03 DOA: 09/12/2020 PCP: [name], MD    Brief Narrative:  Hector [name] is a 55 y.o. male with a past medical history of CKD 4, nonischemic cardiomyopathy, obstructive sleep apnea noncompliant on CPAP, essential hypertension, paroxysmal atrial fibrillation not on anticoagulation, CHF with mildly reduced ejection fraction of 45 to 50% based off echo on 12/21.  Patient presents to the emergency department with sudden onset of chest pain and shortness of breath that woke him up this morning.  The chest pain is located on the left side but he states he also has some left arm pain that originates from his neck. Denies any orthopnea.  Endorses significant dyspnea on exertion and paroxysmal nocturnal dyspnea.  He has had worsening lower extremity edema.  7/22-refusing getting more lasix this am as it causes his back to hurt and he is not getting any rest b/c he has to urinate too much  Consultants:  cardiology  Procedures:   Antimicrobials:      Subjective: No cp  Objective: Vitals:   09/13/20 0430 09/13/20 0500 09/13/20 0530 09/13/20 0630  BP: 133/82 (!) 136/107 125/88 (!) 152/114  Pulse: 62 72 (!) 59 (!) 59  Resp:   16 16  Temp:      SpO2: 99% 100% 97% 100%    Intake/Output Summary (Last 24 hours) at 09/13/2020 [id number] Last data filed at 09/13/2020 [id number] Gross per 24 hour  Intake 474 ml  Output 2800 ml  Net -2326 ml   There were no vitals filed for this visit.  Examination:  General exam: nad, sleeping on his side Respiratory system: decrease bs at bases with poor resp. effort Cardiovascular system: S1 & S2 heard, RRR. No gallop  Gastrointestinal system: Abdomen is nondistended, soft and nontender. Normal bowel sounds heard. Central nervous system: Alert and oriented.  Grossly intact Extremities: Positive lower extremity edema Psychiatry: Mood & affect appropriate.     Data Reviewed: I have  personally reviewed following labs and imaging studies  CBC: Recent Labs  Lab 09/12/20 1320 09/13/20 0717  WBC 10.3 9.2  HGB 9.3* 10.0*  HCT 30.3* 32.4*  MCV 79.9* 80.0  PLT 297 [id number]   Basic Metabolic Panel: Recent Labs  Lab 09/12/20 1320 09/13/20 0717  NA 137 137  K 4.7 4.1  CL 108 106  CO2 19* 19*  GLUCOSE 101* 91  BUN 29* 33*  CREATININE 2.53* 2.47*  CALCIUM 8.8* 8.8*   GFR: CrCl cannot be calculated (Unknown ideal weight.). Liver Function Tests: No results for input(s): AST, ALT, ALKPHOS, BILITOT, PROT, ALBUMIN in the last 168 hours. No results for input(s): LIPASE, AMYLASE in the last 168 hours. No results for input(s): AMMONIA in the last 168 hours. Coagulation Profile: No results for input(s): INR, PROTIME in the last 168 hours. Cardiac Enzymes: No results for input(s): CKTOTAL, CKMB, CKMBINDEX, TROPONINI in the last 168 hours. BNP (last 3 results) No results for input(s): PROBNP in the last 8760 hours. HbA1C: No results for input(s): HGBA1C in the last 72 hours. CBG: No results for input(s): GLUCAP in the last 168 hours. Lipid Profile: No results for input(s): CHOL, HDL, LDLCALC, TRIG, CHOLHDL, LDLDIRECT in the last 72 hours. Thyroid Function Tests: No results for input(s): TSH, T4TOTAL, FREET4, T3FREE, THYROIDAB in the last 72 hours. Anemia Panel: No results for input(s): VITAMINB12, FOLATE, FERRITIN, TIBC, IRON, RETICCTPCT in the last 72 hours. Sepsis Labs: No results for input(s): PROCALCITON, LATICACIDVEN  in the last 168 hours.  Recent Results (from the past 240 hour(s))  SARS CORONAVIRUS 2 (TAT 6-24 HRS) Nasopharyngeal Nasopharyngeal Swab     Status: None   Collection Time: 09/12/20  3:49 PM   Specimen: Nasopharyngeal Swab  Result Value Ref Range Status   SARS Coronavirus 2 NEGATIVE NEGATIVE Final    Comment: (NOTE) SARS-CoV-2 target nucleic acids are NOT DETECTED.  The SARS-CoV-2 RNA is generally detectable in upper and lower respiratory  specimens during the acute phase of infection. Negative results do not preclude SARS-CoV-2 infection, do not rule out co-infections with other pathogens, and should not be used as the sole basis for treatment or other patient management decisions. Negative results must be combined with clinical observations, patient history, and epidemiological information. The expected result is Negative.  Fact Sheet for Patients: SugarRoll.be  Fact Sheet for Healthcare Providers: https://www.woods-mathews.com/  This test is not yet approved or cleared by the [place] FDA and  has been authorized for detection and/or diagnosis of SARS-CoV-2 by FDA under an Emergency Use Authorization (EUA). This EUA will remain  in effect (meaning this test can be used) for the duration of the COVID-19 declaration under Se ction 564(b)(1) of the Act, 21 U.S.C. section 360bbb-3(b)(1), unless the authorization is terminated or revoked sooner.  Performed at [facility], [place] [address] [place], [place] [address]          Radiology Studies: DG Chest 2 View  Result Date: 09/12/2020 CLINICAL DATA:  Left-sided chest pain for several hours EXAM: CHEST - 2 VIEW COMPARISON:  06/19/2020 FINDINGS: Cardiac shadow is enlarged. Lungs are well aerated bilaterally. Stable appearing vascular congestion is noted without significant edema. No focal infiltrate is noted. No bony abnormality is seen. IMPRESSION: Stable appearing vascular congestion. Electronically Signed   By: Inez Catalina M.D.   On: 09/12/2020 13:55        Scheduled Meds:  furosemide  40 mg Intravenous Q12H   heparin  5,000 Units Subcutaneous Q8H   sodium chloride flush  3 mL Intravenous Q12H   Continuous Infusions:  sodium chloride      Assessment & Plan:   Active Problems:   Congestive heart failure (CHF) (HCC)   Dyspnea on exertion and bilateral lower extremity edema secondary to acute on  chronic CHF decompensation:  Cardiology consulted Patient refused his Lasix this a.m. as above.  Try to talk to him about taking and he still refusing as he does not want to interrupt his sleep by going to the bathroom all the time. Will continue Lasix 40 mg IV twice daily monitor electrolytes and renal function I's and O's Daily weight Echo pending     Paroxysmal atrial fibrillation:  Currently on sinus rhythm  A. fib chronic anticoagulation for stroke prophylaxis to cardiology service     Uncontrolled hypertension:  Improving.  Continue hydralazine, coreg, lasix.   chest pain likely secondary to hypertensive urgency/emergency:  Follow-up echo  BP control   Chronically elevated troponins:  Mildly more elevated than baseline, likely due to demand ischemia    CKD stage 3b- at baseline  DVT prophylaxis: Heparin subcu Code Status: Full Family Communication: None at bedside Disposition Plan:  Status is: Inpatient  Remains inpatient appropriate because:Inpatient level of care appropriate due to severity of illness  Dispo: The patient is from: Home              Anticipated d/c is to: Home  Patient currently is not medically stable to d/c.   Difficult to place patient No            LOS: 1 day   Time spent: 45 minutes with more than 50% on [place], MD [facility] Pager [phone]  If 7PM-7AM, please contact night-coverage 09/13/2020, 9:03 AM

## 2020-09-13 NOTE — Consult Note (Addendum)
Cardiology Consultation:   [name] MRN: [id number]; DOB: 04/24/1965  Admit date: 09/12/2020 Date of Consult: 09/13/2020  PCP:  [name], MD   [place] Providers Cardiologist:  Ida Rogue, MD   {  Patient Profile:   Hector [name] is a 55 y.o. male with a hx of chronic combined CHF/NICM likely secondary to ETOH abuse, HTN, HLD, paroxysmal afib, DM type 2, hypothyroidism, OSA on CPAP, CKD stage 3 and covid infection requiring hospitalization 11/2019 who is being seen 09/13/2020 for the evaluation of acute CHF and afib at the request of Dr. Kurtis Bushman.  History of Present Illness:   Hector [name] was hospitalized 11/2019-12/2019 for COVD infection. He was discharged to [facility]. He is a poor historian.   Historically he has low EF, as low as 20% in 2009 and 45-50 by echo in 2018. He had a cardiac cath in 2016 showing nonobstructive CAD. Seen during an admission 01/2020 for Acute CHF. Echo showed EF 45-50%. He was given IV lasix and discharged. He was in NSR. CKD limited GDMT.  Last seen in the office 04/05/20 and was stable from a cardiac standpoint. Noted weight loss since COVID infection and hypotension. Hydralazine and amlodipine were held.   Patient presented to the [facility] ER 09/13/20 for progressive sob and LLE for the last 2-3 days. Can't walk to far due to SOB, however has been severe since having COVID. No chest pain. No heart racing. No fever chills. No appetite. He is not weighing at home. Has not had medications at home, reports has not had medications since March 2022. Says he is staying at ?rehab and has trouble getting his medications. He denies alcohol use. No toabcco, drug use.    In the ER SBP int he 180s and DBP in the 130s. Heart rate 60-70s, 100% on RA, afebrile. Labs showed CO2 19, creatinine 2.53, BUN 29, calcium 8.8, Hgb 9.3, WBC 9.2. HS troponin 54>43. BNP 3,375. COVID negative. Stable appearing vascular congestion. Patient  was given IV lasix and admitted for further work-up.    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 12/2007 MV: EF 28%, no isch;  b. 8/12 Echo: EF 25-35%; c. 02/2014 Echo: EF 20-25%; d. 12/2014 Cath: minimal CAD; e. 01/2015 Echo: EF 50-55%;  d. 05/2016 Echo: EF 30-35%, diff HK, gr2 DD; e. 11/2016 Echo: EF 45-50%, diff HK.   Chronic combined systolic (congestive) and diastolic (congestive) heart failure ([place])    a. 05/2016 Most recent Echo: EF 30-35%, diff HK, Gr2 DD, mild MR, mod dil LA; b. 11/2016 Echo: EF 45-50%, diff HK.   CKD (chronic kidney disease), stage III (HCC)    Elevated troponin    Chronically elevated. - level was 0.17-0.10 during recent admission   Essential hypertension    GI bleed 11/2013   Hyperlipidemia    Pancreatitis    Paroxysmal A-fib (HCC)    a. new onset s/p unsuccessful TEE/DCCV on 08/16/2014; b. on amio/eliquis (CHA2DS2VASc = 2-3); c. reports 1-2 hrs of afib ~ q36mo.   Paroxysmal atrial flutter [place]    a. new onset 07/2014; b. s/p unsuccessful TEE/DCCV 08/16/2014; c. on apixaban.   Sleep-disordered breathing    Has yet to have a sleep study   Stroke ([facility]     Past Surgical History:  Procedure Laterality Date   CARDIAC CATHETERIZATION N/A 01/09/2015   Procedure: Left Heart Cath and Coronary Angiography;  Surgeon: [name], MD;  Location: [facility];  Service: Cardiovascular;  Laterality: N/A;   COLONOSCOPY N/A 07/22/2020   Procedure: COLONOSCOPY;  Surgeon: Toledo, Benay Pike, MD;  Location: [facility] ENDOSCOPY;  Service: Gastroenterology;  Laterality: N/A;   ELECTROPHYSIOLOGIC STUDY N/A 08/16/2014   Procedure: CARDIOVERSION;  Surgeon: [name], MD;  Location: [facility] ORS;  Service: Cardiovascular;  Laterality: N/A;   FLEXIBLE SIGMOIDOSCOPY N/A 10/10/2015   Procedure: FLEXIBLE SIGMOIDOSCOPY;  Surgeon: [name], MD;  Location: [facility] ENDOSCOPY;  Service: Endoscopy;  Laterality: N/A;   KNEE  SURGERY Right    NM MYOVIEW LTD  November 2011   No ischemia or infarction. EF 50-55% ( no improvement from 2009 Myoview EF of 28%   TEE WITHOUT CARDIOVERSION N/A 08/16/2014   Procedure: TRANSESOPHAGEAL ECHOCARDIOGRAM (TEE);  Surgeon: [name], MD;  Location: [facility] ORS;  Service: Cardiovascular;  Laterality: N/A;     Home Medications:  Prior to Admission medications   Medication Sig Start Date End Date Taking? Authorizing Provider  allopurinol (ZYLOPRIM) 100 MG tablet Take 100 mg by mouth daily as needed. Patient not taking: No sig reported    [provider]  amiodarone (PACERONE) 200 MG tablet Take 1 tablet (200 mg total) by mouth daily. Patient not taking: No sig reported 01/03/18   Darylene Price A, FNP  atorvastatin (LIPITOR) 80 MG tablet Take 1 tablet (80 mg total) by mouth daily. Patient not taking: No sig reported 03/15/18   Darylene Price A, FNP  carvedilol (COREG) 6.25 MG tablet Take 1 tablet (6.25 mg total) by mouth 2 (two) times daily with a meal. Patient not taking: No sig reported 02/07/20   Pokhrel, Corrie Mckusick, MD  docusate sodium (COLACE) 100 MG capsule Take 100 mg by mouth daily.    [provider]  famotidine (PEPCID) 40 MG tablet Take 0.5 tablets (20 mg total) by mouth 2 (two) times daily. Patient not taking: No sig reported 07/25/20   Max Sane, MD  ferrous gluconate (FERGON) 324 MG tablet Take 324 mg by mouth daily with breakfast.    [provider]  furosemide (LASIX) 40 MG tablet Take 1 tablet (40 mg total) by mouth 2 (two) times daily for 15 days. 06/18/20 07/03/20  Naaman Plummer, MD  gabapentin (NEURONTIN) 100 MG capsule Take 2 capsules (200 mg total) by mouth 3 (three) times daily. 06/20/20 08/19/20  Menshew, Dannielle Karvonen, PA-C  hydrALAZINE (APRESOLINE) 25 MG tablet Take 1 tablet (25 mg total) by mouth 4 (four) times daily. 06/18/20 07/18/20  Naaman Plummer, MD  levothyroxine (SYNTHROID) 25 MCG tablet Take 25 mcg by mouth daily before  breakfast. Patient not taking: No sig reported    [provider]  Menthol, Topical Analgesic, (BIOFREEZE ROLL-ON COLORLESS) 4 % GEL Apply 1 application topically daily.    [provider]  Nutritional Supplements (FEEDING SUPPLEMENT, NEPRO CARB STEADY,) LIQD Take 237 mLs by mouth 3 (three) times daily between meals. 01/19/20   Enzo Bi, MD  ondansetron (ZOFRAN) 4 MG tablet Take 4 mg by mouth every 8 (eight) hours as needed for nausea or vomiting.    [provider]  Pollen Extracts (PROSTAT PO) Take 50 mLs by mouth daily.    [provider]  traMADol (ULTRAM) 50 MG tablet Take 1 tablet (50 mg total) by mouth every 6 (six) hours as needed. 08/27/20 08/27/21  Arta Silence, MD  vitamin B-12 (CYANOCOBALAMIN) 500 MCG tablet Take 500 mcg by mouth daily.  [provider]    Inpatient Medications: Scheduled Meds:  furosemide  40 mg Intravenous Q12H   heparin  5,000 Units Subcutaneous Q8H   sodium chloride flush  3 mL Intravenous Q12H   Continuous Infusions:  sodium chloride     PRN Meds: sodium chloride, acetaminophen, hydrALAZINE, ondansetron (ZOFRAN) IV, sodium chloride flush  Allergies:    Allergies  Allergen Reactions   Esomeprazole Magnesium Other (See Comments)    Suspected interstitial nephritis 2018   Eggs Or Egg-Derived Products Rash    Social History:   Social History   Socioeconomic History   Marital status: Single    Spouse name: Not on file   Number of children: Not on file   Years of education: Not on file   Highest education level: Not on file  Occupational History   Not on file  Tobacco Use   Smoking status: Former    Packs/day: 1.00    Years: 12.00    Pack years: 12.00    Types: Cigarettes    Quit date: 02/23/1998    Years since quitting: 22.5   Smokeless tobacco: Never  Vaping Use   Vaping Use: Never used  Substance and Sexual Activity   Alcohol use: Not Currently    Alcohol/week: 0.0 standard drinks     Comment: Past heavy drinker   Drug use: No   Sexual activity: Not Currently  Other Topics Concern   Not on file  Social History Narrative   Not on file   Social Determinants of Health   Financial Resource Strain: Not on file  Food Insecurity: Not on file  Transportation Needs: Not on file  Physical Activity: Not on file  Stress: Not on file  Social Connections: Not on file  Intimate Partner Violence: Not on file    Family History:    Family History  Problem Relation Age of Onset   Hypertension Mother    Hyperlipidemia Mother    Diabetes Mother      ROS:  Please see the history of present illness.   All other ROS reviewed and negative.     Physical Exam/Data:   Vitals:   09/13/20 0430 09/13/20 0500 09/13/20 0530 09/13/20 0630  BP: 133/82 (!) 136/107 125/88 (!) 152/114  Pulse: 62 72 (!) 59 (!) 59  Resp:   16 16  Temp:      SpO2: 99% 100% 97% 100%    Intake/Output Summary (Last 24 hours) at 09/13/2020 0743 Last data filed at 09/13/2020 0622 Gross per 24 hour  Intake 474 ml  Output 2800 ml  Net -2326 ml   Last 3 Weights 07/22/2020 07/20/2020 06/20/2020  Weight (lbs) 253 lb 15.5 oz 255 lb 11.7 oz 256 lb  Weight (kg) 115.2 kg 116 kg 116.121 kg     There is no height or weight on file to calculate BMI.  General:  Well nourished, well developed, in no acute distress HEENT: normal Lymph: no adenopathy Neck: minimal JVD Endocrine:  No thryomegaly Vascular: No carotid bruits; FA pulses 2+ bilaterally without bruits  Cardiac:  normal S1, S2; RRR; no murmur  Lungs:  diffusely diminished  Abd: bloated, nontender, no hepatomegaly  Ext: no edema, cold Musculoskeletal:  No deformities, BUE and BLE strength normal and equal Skin: warm and dry  Neuro:  CNs 2-12 intact, no focal abnormalities noted Psych:  Normal affect   EKG:  The EKG was personally reviewed and demonstrates:  NSR, 83bpm, PRI 139m, QRS 1162m Qtc 50541melemetry:  Telemetry  was personally reviewed and  demonstrates:  NSR, HR 60-70s, PVCs  Relevant CV Studies:  Echo 09/13/20   1. Left ventricular ejection fraction, by estimation, is 25 to 30%. The  left ventricle has severely decreased function. The left ventricle  demonstrates global hypokinesis. The left ventricular internal cavity size  was mildly dilated. Left ventricular  diastolic parameters are indeterminate. The average left ventricular  global longitudinal strain is -6.7 %. The global longitudinal strain is  abnormal.   2. Right ventricular systolic function is moderately reduced. The right  ventricular size is normal. Tricuspid regurgitation signal is inadequate  for assessing PA pressure.   3. The mitral valve is normal in structure. Moderate mitral valve  regurgitation.   4. The inferior vena cava is dilated in size with <50% respiratory  variability, suggesting right atrial pressure of 15 mmHg.   Echo 01/2020 1. Left ventricular ejection fraction, by estimation, is 45 to 50%. The  left ventricle has normal function. The left ventricle demonstrates  regional wall motion abnormalities. Abnormal (paradoxical) septal motion,  consistent with left bundle branch  block. Left ventricular diastolic parameters are indeterminate.   2. Right ventricular systolic function is normal. The right ventricular  size is normal. Tricuspid regurgitation signal is inadequate for assessing  PA pressure.   3. The mitral valve is normal in structure. No evidence of mitral valve  regurgitation.   4. The aortic valve was not well visualized. Aortic valve regurgitation  is not visualized. No aortic stenosis is present.   5. The inferior vena cava is normal in size with greater than 50%  respiratory variability, suggesting right atrial pressure of 3 mmHg.   LHC 2016 Angiographically minimal CAD Non-ishcemic Cardiomyopathy Systemic Hypertension with Mild-Moderately increase LVEDP after pre-cath hydration.     This confirms the presumed  diagnosis of Non-ischemic Cardiomyopathy.   Plan: Discharge home after Bed-rest & TR band removal Continue current Cardiomyopathy Medications - BB, Entresto & Lasix Will refer back to Dr. Caryl Comes for reconsideration of ICD.    Laboratory Data:  High Sensitivity Troponin:   Recent Labs  Lab 09/12/20 1320 09/12/20 1515  TROPONINIHS 54* 43*     Chemistry Recent Labs  Lab 09/12/20 1320  NA 137  K 4.7  CL 108  CO2 19*  GLUCOSE 101*  BUN 29*  CREATININE 2.53*  CALCIUM 8.8*  GFRNONAA 29*  ANIONGAP 10    No results for input(s): PROT, ALBUMIN, AST, ALT, ALKPHOS, BILITOT in the last 168 hours. Hematology Recent Labs  Lab 09/12/20 1320  WBC 10.3  RBC 3.79*  HGB 9.3*  HCT 30.3*  MCV 79.9*  MCH 24.5*  MCHC 30.7  RDW 18.7*  PLT 297   BNP Recent Labs  Lab 09/12/20 1320  BNP 3,375.0*    DDimer No results for input(s): DDIMER in the last 168 hours.   Radiology/Studies:  DG Chest 2 View  Result Date: 09/12/2020 CLINICAL DATA:  Left-sided chest pain for several hours EXAM: CHEST - 2 VIEW COMPARISON:  06/19/2020 FINDINGS: Cardiac shadow is enlarged. Lungs are well aerated bilaterally. Stable appearing vascular congestion is noted without significant edema. No focal infiltrate is noted. No bony abnormality is seen. IMPRESSION: Stable appearing vascular congestion. Electronically Signed   By: Inez Catalina M.D.   On: 09/12/2020 13:55     Assessment and Plan:   Acute on Chronic combined CHF/NICM - presented with worsening sob, has not has his medications in 4 months, says it's difficult to get them. H/o of  alcohol abuse and suspected alcohol induced CM. He denies any recent alcohol use - Labs showed BNP elevated to 3000 and CXR with - Last echo showed LVEF 45-50% - Echo this admission showed LVEF 25-30%, global HK, moderately reduced RV function, moderate MR - started on IV lasix '40mg'$  BID - strict I/Os, monitor Scr, and daily weights - He was previously on Coreg 6.'25mg'$   BID, hydralazine '25mg'$  QID>> can eventually restart - Given low EF may need to consider ischemic work-up - No Ace/ARB/ARNI/SGLt2i with CKD  Paroxysmal Afib - He is maintaining NSR - previously on coreg for rate control, Eliquis for stroke ppx, and amiodarone '200mg'$  daily - CHADSVAC at least 4 (CHF, HTN, DM2, PAD). On heparin injections - restart Eliqus  Elevated troponin - mildly elevated with flat trend in the setting of acute CHF and uncontrolled HTN, suspect demand ischemia - EKG with no new changes - Heparin injections - Patient denied chest pain on my interview - Given low EF may consider ischemic evaluation, will discuss with MD.   HTN - BP elevated on admission and he was treated with IV labetalol in the ER - started on hydralazine '10mg'$  Q4H for high blood pressure - BPS improved - consider restarting home medications  HLD - LDL 174 2020 - continue atorvastatin '80mg'$  daily  CKD stage 3 - Scr 2.47, BUN 33.  - appears to be around his baseline - continue to monitor  Social Issues - consider social work consult for medication management  For questions or updates, please contact [place] Please consult [email or web address] for contact info under    Signed, Marine Lezotte Ninfa Meeker, PA-C  09/13/2020 7:43 AM

## 2020-09-13 NOTE — Progress Notes (Signed)
*  PRELIMINARY RESULTS* Echocardiogram 2D Echocardiogram has been performed.  Hector [name] 09/13/2020, 10:00 AM

## 2020-09-13 NOTE — Evaluation (Signed)
Physical Therapy Evaluation Patient Details Name: Hector [name] MRN: [id number] DOB: 22-Dec-1965 Today's Date: 09/13/2020   History of Present Illness  Hector [name] is a 55 y.o. male with a past medical history of CKD 4, nonischemic cardiomyopathy, obstructive sleep apnea noncompliant on CPAP, essential hypertension, paroxysmal atrial fibrillation not on anticoagulation, CHF with mildly reduced ejection fraction of 45 to 50% based off echo on 12/21.  Patient presents to the emergency department with sudden onset of chest pain and shortness of breath  Clinical Impression  Pt sitting in chair upon PT/OT arrival, attempting to urinate. Pt states he has been mobilizing throughout the room on his own. PT/OT co-treat due to limited pt participation during previous pt encounters. Pt HR remained stable throughout session; 78bpm at rest and 92bpm upon completion of mobility. Pt sat EOB for >5 minutes during history with no LOB. STS and chair>EOB transfer performed MOD I requiring increased time to complete and a steadying surface (SPC and/or bed rail). Ambulation was completed with SUP for safety; he walked 125f, choosing to turn around at 549fdue to fatigue. Pt demonstrates mild gait impairments and decreased functional endurance. Strength and ROM WFL. Bed mobility not observed. Pt does not require skilled PT services. He would benefit from working with mobility specialist and ambulating in hall with nursing staff. Pt was educated on benefits of frequent mobility and encouraged to perform with medical staff.      Follow Up Recommendations No PT follow up    Equipment Recommendations  None recommended by PT    Recommendations for Other Services       Precautions / Restrictions Precautions Precautions: Fall Restrictions Weight Bearing Restrictions: No      Mobility  Bed Mobility Overal bed mobility: Independent             General bed mobility comments: Not observed during  session.    Transfers Overall transfer level: Modified independent Equipment used: Straight cane             General transfer comment: STS MOD I using SPC  Ambulation/Gait Ambulation/Gait assistance: Supervision Gait Distance (Feet): 100 Feet Assistive device: Straight cane Gait Pattern/deviations: WFL(Within Functional Limits);Step-through pattern;Decreased stride length;Wide base of support Gait velocity: decreased   General Gait Details: Pt ambulated using SPC in RUE, wide BOS, no LOB. VC for increased environmental awareness. SUP for safety.  Stairs            Wheelchair Mobility    Modified Rankin (Stroke Patients Only)       Balance Overall balance assessment: Modified Independent                                           Pertinent Vitals/Pain Pain Assessment: 0-10 Pain Score: 6  Pain Location: L hand Pain Descriptors / Indicators: Discomfort;Dull Pain Intervention(s): Limited activity within patient's tolerance    Home Living Family/patient expects to be discharged to:: Private residence Living Arrangements: Parent (mother) Available Help at Discharge: Family Type of Home: House Home Access: Stairs to enter Entrance Stairs-Rails: Right;Left;Can reach both En[employer]ical brewerf Steps: 3 Home Layout: One level Home Equipment: Cane - single point;Walker - 2 wheels;Shower seat      Prior Function Level of Independence: Independent with assistive device(s)         Comments: MOD I using SPC as needed. Reports 1 fall in 6 months falling  out of bed while asleep     Hand Dominance   Dominant Hand: Right    Extremity/Trunk Assessment   Upper Extremity Assessment Upper Extremity Assessment: Overall WFL for tasks assessed    Lower Extremity Assessment Lower Extremity Assessment: Overall WFL for tasks assessed    Cervical / Trunk Assessment Cervical / Trunk Assessment: Normal  Communication   Communication: No  difficulties  Cognition Arousal/Alertness: Awake/alert Behavior During Therapy: WFL for tasks assessed/performed Overall Cognitive Status: Within Functional Limits for tasks assessed                                 General Comments: A&O x3 - states year as 2020      General Comments General comments (skin integrity, edema, etc.): HR 91 with mobility    Exercises Other Exercises Other Exercises: Pt educated re; PT role, DME recs, d/c recs, falls prevention, safety with mobility Other Exercises: LBD, toileting, sit>sup, sit<>stand, sitting/standing balance/tolerance, grooming   Assessment/Plan    PT Assessment Patent does not need any further PT services  PT Problem List         PT Treatment Interventions      PT Goals (Current goals can be found in the Care Plan section)  Acute Rehab PT Goals Patient Stated Goal: to go home PT Goal Formulation: With patient Time For Goal Achievement: 09/27/20 Potential to Achieve Goals: Good    Frequency     Barriers to discharge        Co-evaluation PT/OT/SLP Co-Evaluation/Treatment: Yes Reason for Co-Treatment: For patient/therapist safety PT goals addressed during session: Mobility/safety with mobility OT goals addressed during session: ADL's and self-care       AM-PAC PT "6 Clicks" Mobility  Outcome Measure Help needed turning from your back to your side while in a flat bed without using bedrails?: None Help needed moving from lying on your back to sitting on the side of a flat bed without using bedrails?: None Help needed moving to and from a bed to a chair (including a wheelchair)?: None Help needed standing up from a chair using your arms (e.g., wheelchair or bedside chair)?: None Help needed to walk in hospital room?: A Little Help needed climbing 3-5 steps with a railing? : A Little 6 Click Score: 22    End of Session Equipment Utilized During Treatment: Gait belt Activity Tolerance: Patient tolerated  treatment well Patient left: in bed (sitting EOB with OT in room)   PT Visit Diagnosis: Other abnormalities of gait and mobility (R26.89)    Time: 1335-1350 PT Time Calculation (min) (ACUTE ONLY): 15 min   Charges:   PT Evaluation $PT Eval Low Complexity: 1 Low          Patrina Levering PT, DPT 09/13/20 4:04 PM [id number]   Ramonita Lab 09/13/2020, 3:54 PM

## 2020-09-13 NOTE — Progress Notes (Signed)
Report given to [facility]. Patient to be transported to room 255.

## 2020-09-13 NOTE — ED Notes (Addendum)
Pt moved rooms due to malfunctioning [facility] unit in room - it was not working and the room was very very cold and the patient could not get comfortable or warm despite multiple warm blankets provided.

## 2020-09-13 NOTE — Evaluation (Signed)
Occupational Therapy Evaluation Patient Details Name: Hector [name] MRN: [id number] DOB: [birth year] Today's Date: 09/13/2020    History of Present Illness Hector [name] is a 55 y.o. male with a past medical history of CKD 4, nonischemic cardiomyopathy, obstructive sleep apnea noncompliant on CPAP, essential hypertension, paroxysmal atrial fibrillation not on anticoagulation, CHF with mildly reduced ejection fraction of 45 to 50% based off echo on 12/21.  Patient presents to the emergency department with sudden onset of chest pain and shortness of breath   Clinical Impression   Hector [name] was seen for OT evaluation this date, seen with PT 2/2 anticipated needs for mobility and encouragement to participate. Prior to hospital admission, pt was MOD I for ADLs and mobility. Pt lives with his mother in home c 3 STE. Pt demonstrates baseline independence to perform ADL and mobility tasks using SPC.  No skilled acute OT needs identified. Will sign off. Please re-consult if additional OT needs arise.     Follow Up Recommendations  No OT follow up    Equipment Recommendations  None recommended by OT    Recommendations for Other Services       Precautions / Restrictions Precautions Precautions: Fall Restrictions Weight Bearing Restrictions: No      Mobility Bed Mobility Overal bed mobility: Independent                  Transfers Overall transfer level: Modified independent               General transfer comment: MOD I using SPC    Balance Overall balance assessment: Modified Independent (single UE support)                                         ADL either performed or assessed with clinical judgement   ADL Overall ADL's : Modified independent                                       General ADL Comments: MOD I using SPC      Pertinent Vitals/Pain Pain Assessment: 0-10 Pain Score: 6  Pain Location: L hand Pain  Descriptors / Indicators: Discomfort;Dull Pain Intervention(s): Limited activity within patient's tolerance;Repositioned     Hand Dominance Right   Extremity/Trunk Assessment Upper Extremity Assessment Upper Extremity Assessment: Overall WFL for tasks assessed (reports LUE tingling/pain)   Lower Extremity Assessment Lower Extremity Assessment: Overall WFL for tasks assessed       Communication Communication Communication: No difficulties   Cognition Arousal/Alertness: Awake/alert Behavior During Therapy: WFL for tasks assessed/performed Overall Cognitive Status: Within Functional Limits for tasks assessed                                 General Comments: A&O x3 - states year as 2020   General Comments  HR 91 with mobility    Exercises Exercises: Other exercises Other Exercises Other Exercises: Pt educated re; OT role, DME recs, d/c recs, falls prevention, ECS Other Exercises: LBD, toileting, sit>sup, sit<>stand, sitting/standing balance/tolerance, grooming   Shoulder Instructions      Home Living Family/patient expects to be discharged to:: Private residence Living Arrangements: Parent (mother) Available Help at Discharge: Family Type of Home: House Home Access: Stairs to enter  Entrance Stairs-Number of Steps: 3 Entrance Stairs-Rails: Right;Left;Can reach both Home Layout: One level     Bathroom Shower/Tub: Tub/shower unit         Home Equipment: Cane - single point;Walker - 2 wheels;Shower seat          Prior Functioning/Environment Level of Independence: Independent with assistive device(s)        Comments: MOD I using SPC as needed. Reports 1 fall in 6 months falling out of bed while asleep        OT Problem List: Decreased activity tolerance;Decreased safety awareness         OT Goals(Current goals can be found in the care plan section) Acute Rehab OT Goals Patient Stated Goal: to go home OT Goal Formulation: With patient Time  For Goal Achievement: 09/27/20 Potential to Achieve Goals: Good             Co-evaluation PT/OT/SLP Co-Evaluation/Treatment: Yes Reason for Co-Treatment: For patient/therapist safety PT goals addressed during session: Mobility/safety with mobility OT goals addressed during session: ADL's and self-care      AM-PAC OT "6 Clicks" Daily Activity     Outcome Measure Help from another person eating meals?: None Help from another person taking care of personal grooming?: None Help from another person toileting, which includes using toliet, bedpan, or urinal?: A Little Help from another person bathing (including washing, rinsing, drying)?: A Little Help from another person to put on and taking off regular upper body clothing?: None Help from another person to put on and taking off regular lower body clothing?: None 6 Click Score: 22   End of Session Equipment Utilized During Treatment: Other (comment) (SPC) Nurse Communication: Mobility status  Activity Tolerance: Patient tolerated treatment well Patient left: in bed;with call bell/phone within reach  OT Visit Diagnosis: Other abnormalities of gait and mobility (R26.89)                Time: [id number] OT Time Calculation (min): 1398 min Charges:  OT General Charges $OT Visit: 1 Visit OT Evaluation $OT Eval Low Complexity: 1 Low  Dessie Coma, M.S. OTR/L  09/13/20, 2:34 PM  ascom [phone]

## 2020-09-13 NOTE — Consult Note (Addendum)
   Heart Failure Nurse Navigator Note  HFrEF 25 to 30%.  Abnormal global longitudinal strain.  Moderate mitral regurgitation.  Ejection fraction had previously been reported at 45 to 50% in December 2021.  As far back as 2009 had been reported 20%.   He presented to the emergency room with complaints of chest pain, shortness of breath and lower extremity edema.    Comorbidities:  Paroxysmal atrial fib/flutter Sleep apnea Gout Obesity Chronic kidney disease stage IV Hyperlipidemia Hypertension Nonobstructive coronary artery disease by catheterization  Medications:  Lasix 40 mg IV every 12 hours  To start: Carvedilol 6.25 mg 2 times a day with meals Hydralazine 25 mg 3 times a day   Labs:  Sodium 137, potassium 4.1, chloride 101, CO2 29, BUN 33, creatinine 2.47, BNP on admission 3375, hemoglobin 10, hematocrit 32.4 Intake 447 mL Output 2800 mL Blood pressure is 147/102 and 125/93.     Assessment:  General-he is awake and alert sitting up in the chair at bedside finishing lunch.  No acute distress noted. He denies SOB at rest, but still SOB with ambulation.   HEENT-pupils are equal, speech is clear, poor dentition.   Cardiac-heart tones of regular rate and rhythm.  Chest-clear   Abdomen-rounded soft nontender  Musculoskeletal-trace edema.  Psych-is pleasant and appropriate makes good eye contact.   Neurologic-speech is clear, moves all extremities without difficulty.    Initial meeting with patient today.  States that he has followed with Darylene Price in the outpatient heart failure clinic and knows he has an upcoming appointment on August 2 at 2 PM.  Talked about how he takes care of himself at home.  He states that he pretty much eats anything and everything that he should not with heart failure.  He also states that he drinks a gallon of water daily.  He states that he lives with his mother and they both share the responsibility of cooking.  Discussed  limiting his fluids to no more than 64 ounces in a 24 -hour period which is half of what he is taking in at this time  Discussed the daily routine of weighing daily, states that he does not do that, he has a new scale in the box, he goes on to state that has been in the box for 2 years and wonders if it would still work.  Explained the reasoning behind weighing daily and reporting.  States that he has not taken his medications since he had COVID and explained that it was his fault that he had not seen his PCP to get his medications.  He was given the living with heart failure teaching booklet, instruction sheet on taking care of your heart failure and information on low-sodium.  Will continue to follow along.  Pricilla Riffle RN [facility]

## 2020-09-13 NOTE — Progress Notes (Signed)
Initial Nutrition Assessment  DOCUMENTATION CODES:   Obesity unspecified  INTERVENTION:   Nepro Shake po BID, each supplement provides 425 kcal and 19 grams protein  MVI and folic acid po daily in setting of etoh abuse   Recommend high dose thiamine as pt at risk for wet beriberi   Recommend 500 mg of thiamine intravenously, infused over 30 minutes, three times daily for two consecutive days and 250 mg intravenously once daily for an additional five days  NUTRITION DIAGNOSIS:   Increased nutrient needs related to chronic illness (CHF, CKD III) as evidenced by estimated needs.  GOAL:   Patient will meet greater than or equal to 90% of their needs  MONITOR:   PO intake, Supplement acceptance, Labs, Weight trends, Skin, I & O's  REASON FOR ASSESSMENT:   Malnutrition Screening Tool    ASSESSMENT:   55-year-old gentleman with past medical history of nonischemic cardiomyopathy, systolic dysfunction, alcohol abuse, diabetes type 2, paroxysmal atrial fibrillation, chronic kidney disease and COVID infection in October 2021 requiring hospitalization who presents to the emergency room with chest pain, shortness of breath  Pt is known to this RD from a previous admission. Met with pt in room today. Pt working with PT at time of RD visit. Pt's lunch tray was sitting on his side table and was 100% eaten. Pt reports that he ate pretty well today and that he "is trying". RD will add supplements to help pt meet his estimated needs. Pt enjoys Nepro. Per chart, pt's UBW appears to be ~285-290lbs; however, unsure of pt's true weight as pt with numerous admissions for CHF and BLE edema. Per chart, pt appears to be down 39lbs(14%) since having COVID 19 in December 2021; this is significant weight loss.   Medications reviewed and include: lasix, heparin  Labs reviewed: K 4.1 wnl, BUN 33(H), creat 2.47(H) BNP- 3375.0(H)- 7/21 Hgb 10.0(L), Hct 32.4(L) AIC 7.7(H)- 11/2019  NUTRITION - FOCUSED  PHYSICAL EXAM:  Flowsheet Row Most Recent Value  Orbital Region No depletion  Upper Arm Region No depletion  Thoracic and Lumbar Region No depletion  Buccal Region No depletion  Temple Region No depletion  Clavicle Bone Region No depletion  Clavicle and Acromion Bone Region No depletion  Scapular Bone Region No depletion  Dorsal Hand No depletion  Patellar Region Mild depletion  Anterior Thigh Region No depletion  Posterior Calf Region No depletion  Edema (RD Assessment) Moderate  Hair Reviewed  Eyes Reviewed  Mouth Reviewed  Skin Reviewed  Nails Reviewed   Diet Order:   Diet Order             Diet renal with fluid restriction Fluid restriction: 1200 mL Fluid; Room service appropriate? Yes; Fluid consistency: Thin  Diet effective now                  EDUCATION NEEDS:   No education needs have been identified at this time  Skin:  Skin Assessment: Reviewed RN Assessment  Last BM:  pta  Height:   Ht Readings from Last 1 Encounters:  07/20/20 5' 11" (1.803 m)    Weight:   Wt Readings from Last 1 Encounters:  09/13/20 106.5 kg    Ideal Body Weight:  78 kg  BMI:  Body mass index is 32.75 kg/m.  Estimated Nutritional Needs:   Kcal:  2200-2500kcal/day  Protein:  110-125g/day  Fluid:  2.0L/day    MS, RD, LDN Please refer to [facility] for RD and/or RD on-call/weekend/after hours pager  

## 2020-09-14 ENCOUNTER — Other Ambulatory Visit: Payer: Self-pay

## 2020-09-14 DIAGNOSIS — I509 Heart failure, unspecified: Secondary | ICD-10-CM | POA: Diagnosis not present

## 2020-09-14 DIAGNOSIS — I480 Paroxysmal atrial fibrillation: Secondary | ICD-10-CM | POA: Diagnosis not present

## 2020-09-14 DIAGNOSIS — R778 Other specified abnormalities of plasma proteins: Secondary | ICD-10-CM | POA: Diagnosis not present

## 2020-09-14 DIAGNOSIS — N1832 Chronic kidney disease, stage 3b: Secondary | ICD-10-CM

## 2020-09-14 DIAGNOSIS — I5041 Acute combined systolic (congestive) and diastolic (congestive) heart failure: Secondary | ICD-10-CM | POA: Diagnosis not present

## 2020-09-14 LAB — BASIC METABOLIC PANEL
Anion gap: 11 (ref 5–15)
BUN: 35 mg/dL — ABNORMAL HIGH (ref 6–20)
CO2: 25 mmol/L (ref 22–32)
Calcium: 8.7 mg/dL — ABNORMAL LOW (ref 8.9–10.3)
Chloride: 105 mmol/L (ref 98–111)
Creatinine, Ser: 2.65 mg/dL — ABNORMAL HIGH (ref 0.61–1.24)
GFR, Estimated: 27 mL/min — ABNORMAL LOW (ref >60)
Glucose, Bld: 97 mg/dL (ref 70–99)
Potassium: 3.8 mmol/L (ref 3.5–5.1)
Sodium: 141 mmol/L (ref 135–145)

## 2020-09-14 MED ORDER — FUROSEMIDE 40 MG PO TABS: 40.0000 mg | ORAL_TABLET | Freq: Two times a day (BID) | ORAL | Status: DC

## 2020-09-14 MED ORDER — PNEUMOCOCCAL VAC POLYVALENT 25 MCG/0.5ML IJ INJ
0.5000 mL | INJECTION | INTRAMUSCULAR | Status: AC
  Administered 2020-09-15: 0.5 mL via INTRAMUSCULAR
  Filled 2020-09-14: qty 0.5

## 2020-09-14 MED ORDER — APIXABAN 5 MG PO TABS
5.0000 mg | ORAL_TABLET | Freq: Two times a day (BID) | ORAL | Status: DC
  Administered 2020-09-14 – 2020-09-16 (×5): 5 mg via ORAL
  Filled 2020-09-14 (×5): qty 1

## 2020-09-14 NOTE — Progress Notes (Signed)
Patient refused 6 am dose of heparin SQ.  Patient said he was done getting shots.  He had a shot last night and wasn't getting any more.  Nurse attempted to educate Mr. Lindholm about the need for heparin SQ shots, but patient was not interested in listening. Dr. Damita Dunnings contacted regarding patient's refusal of heparin shots.  SCDs ordered by Dr. Damita Dunnings.  No answer by supply department.  Passed on to day shift the need for SCDs.

## 2020-09-14 NOTE — Progress Notes (Signed)
PROGRESS NOTE    Hector [name]  [id number] DOB: 20-Aug-1965 DOA: 09/12/2020 PCP: Hector Median, MD    Brief Narrative:  Hector [name] is a 55 y.o. male with a past medical history of CKD 4, nonischemic cardiomyopathy, obstructive sleep apnea noncompliant on CPAP, essential hypertension, paroxysmal atrial fibrillation not on anticoagulation, CHF with mildly reduced ejection fraction of 45 to 50% based off echo on 12/21.  Patient presents to the emergency department with sudden onset of chest pain and shortness of breath that woke him up this morning.  The chest pain is located on the left side but he states he also has some left arm pain that originates from his neck. Denies any orthopnea.  Endorses significant dyspnea on exertion and paroxysmal nocturnal dyspnea.  He has had worsening lower extremity edema.  7/22-refusing getting more lasix this am as it causes his back to hurt and he is not getting any rest b/c he has to urinate too much  7/23-pt states LE edema improving, no sob. -5.2L UOP  Consultants:  cardiology  Procedures:   Antimicrobials:      Subjective: No cp  Objective: Vitals:   09/13/20 2015 09/13/20 2221 09/14/20 0350 09/14/20 0732  BP: 121/88 114/81 105/76 110/76  Pulse: 66 62 (!) 58 64  Resp: (!) 22 (!) '21 20 20  '$ Temp: 98.3 F (36.8 C)  98.3 F (36.8 C) 98.2 F (36.8 C)  TempSrc: Oral  Oral   SpO2: 98%   98%  Weight:        Intake/Output Summary (Last 24 hours) at 09/14/2020 [id number] Last data filed at 09/14/2020 0802 Gross per 24 hour  Intake 780 ml  Output 3250 ml  Net -2470 ml   Filed Weights   09/13/20 1136  Weight: 106.5 kg    Examination: Nad, calm Decrease bs at bases , no wheezing Reg s1s2 no gallop Soft benign positive bowel sounds Positive lower extremity edema, decreasing aaxox4 grossly intact    Data Reviewed: I have personally reviewed following labs and imaging studies  CBC: Recent Labs  Lab  09/12/20 1320 09/13/20 0717  WBC 10.3 9.2  HGB 9.3* 10.0*  HCT 30.3* 32.4*  MCV 79.9* 80.0  PLT 297 [id number]   Basic Metabolic Panel: Recent Labs  Lab 09/12/20 1320 09/13/20 0717 09/14/20 0606  NA 137 137 141  K 4.7 4.1 3.8  CL 108 106 105  CO2 19* 19* 25  GLUCOSE 101* 91 97  BUN 29* 33* 35*  CREATININE 2.53* 2.47* 2.65*  CALCIUM 8.8* 8.8* 8.7*   GFR: Estimated Creatinine Clearance: 38.7 mL/min (A) (by C-G formula based on SCr of 2.65 mg/dL (H)). Liver Function Tests: No results for input(s): AST, ALT, ALKPHOS, BILITOT, PROT, ALBUMIN in the last 168 hours. No results for input(s): LIPASE, AMYLASE in the last 168 hours. No results for input(s): AMMONIA in the last 168 hours. Coagulation Profile: No results for input(s): INR, PROTIME in the last 168 hours. Cardiac Enzymes: No results for input(s): CKTOTAL, CKMB, CKMBINDEX, TROPONINI in the last 168 hours. BNP (last 3 results) No results for input(s): PROBNP in the last 8760 hours. HbA1C: No results for input(s): HGBA1C in the last 72 hours. CBG: No results for input(s): GLUCAP in the last 168 hours. Lipid Profile: No results for input(s): CHOL, HDL, LDLCALC, TRIG, CHOLHDL, LDLDIRECT in the last 72 hours. Thyroid Function Tests: No results for input(s): TSH, T4TOTAL, FREET4, T3FREE, THYROIDAB in the last 72 hours. Anemia Panel: No results for input(s):  VITAMINB12, FOLATE, FERRITIN, TIBC, IRON, RETICCTPCT in the last 72 hours. Sepsis Labs: No results for input(s): PROCALCITON, LATICACIDVEN in the last 168 hours.  Recent Results (from the past 240 hour(s))  SARS CORONAVIRUS 2 (TAT 6-24 HRS) Nasopharyngeal Nasopharyngeal Swab     Status: None   Collection Time: 09/12/20  3:49 PM   Specimen: Nasopharyngeal Swab  Result Value Ref Range Status   SARS Coronavirus 2 NEGATIVE NEGATIVE Final    Comment: (NOTE) SARS-CoV-2 target nucleic acids are NOT DETECTED.  The SARS-CoV-2 RNA is generally detectable in upper and  lower respiratory specimens during the acute phase of infection. Negative results do not preclude SARS-CoV-2 infection, do not rule out co-infections with other pathogens, and should not be used as the sole basis for treatment or other patient management decisions. Negative results must be combined with clinical observations, patient history, and epidemiological information. The expected result is Negative.  Fact Sheet for Patients: SugarRoll.be  Fact Sheet for Healthcare Providers: https://www.woods-mathews.com/  This test is not yet approved or cleared by the [place] FDA and  has been authorized for detection and/or diagnosis of SARS-CoV-2 by FDA under an Emergency Use Authorization (EUA). This EUA will remain  in effect (meaning this test can be used) for the duration of the COVID-19 declaration under Se ction 564(b)(1) of the Act, 21 U.S.C. section 360bbb-3(b)(1), unless the authorization is terminated or revoked sooner.  Performed at [facility], [place] [address] [place], [place] [address]          Radiology Studies: DG Chest 2 View  Result Date: 09/12/2020 CLINICAL DATA:  Left-sided chest pain for several hours EXAM: CHEST - 2 VIEW COMPARISON:  06/19/2020 FINDINGS: Cardiac shadow is enlarged. Lungs are well aerated bilaterally. Stable appearing vascular congestion is noted without significant edema. No focal infiltrate is noted. No bony abnormality is seen. IMPRESSION: Stable appearing vascular congestion. Electronically Signed   By: Inez Catalina M.D.   On: 09/12/2020 13:55   ECHOCARDIOGRAM COMPLETE  Result Date: 09/13/2020    ECHOCARDIOGRAM REPORT   Patient Name:   Hector [name] Date of Exam: 09/13/2020 Medical Rec #:  [id number]           Height:       71.0 in Accession #:    [id number]          Weight:       254.0 lb Date of Birth:  [birth year]           BSA:          2.334 m Patient Age:    50 years            BP:            152/114 mmHg Patient Gender: M                   HR:           64 bpm. Exam Location:  [facility] Procedure: 2D Echo, Color Doppler, Cardiac Doppler, Strain Analysis and            Intracardiac Opacification Agent Indications:     R06.00 Dyspnea  History:         Patient has prior history of Echocardiogram examinations, most                  recent 01/31/2020. CHF and Cardiomyopathy, CKD and Stroke; Risk  Factors:Hypertension and Dyslipidemia. ETOH.  Sonographer:     Charmayne Sheer RDCS (AE) Referring Phys:  [place] Diagnosing Phys: Ida Rogue MD  Sonographer Comments: Suboptimal subcostal window. Global longitudinal strain was attempted. IMPRESSIONS  1. Left ventricular ejection fraction, by estimation, is 25 to 30%. The left ventricle has severely decreased function. The left ventricle demonstrates global hypokinesis. The left ventricular internal cavity size was mildly dilated. Left ventricular diastolic parameters are indeterminate. The average left ventricular global longitudinal strain is -6.7 %. The global longitudinal strain is abnormal.  2. Right ventricular systolic function is moderately reduced. The right ventricular size is normal. Tricuspid regurgitation signal is inadequate for assessing PA pressure.  3. The mitral valve is normal in structure. Moderate mitral valve regurgitation.  4. The inferior vena cava is dilated in size with <50% respiratory variability, suggesting right atrial pressure of 15 mmHg. FINDINGS  Left Ventricle: Left ventricular ejection fraction, by estimation, is 25 to 30%. The left ventricle has severely decreased function. The left ventricle demonstrates global hypokinesis. Definity contrast agent was given IV to delineate the left ventricular endocardial borders. The average left ventricular global longitudinal strain is -6.7 %. The global longitudinal strain is abnormal. The left ventricular internal cavity size was mildly dilated. There is no  left ventricular hypertrophy. Left ventricular diastolic parameters are indeterminate. Right Ventricle: The right ventricular size is normal. No increase in right ventricular wall thickness. Right ventricular systolic function is moderately reduced. Tricuspid regurgitation signal is inadequate for assessing PA pressure. Left Atrium: Left atrial size was normal in size. Right Atrium: Right atrial size was normal in size. Pericardium: There is no evidence of pericardial effusion. Mitral Valve: The mitral valve is normal in structure. Moderate mitral valve regurgitation. No evidence of mitral valve stenosis. MV peak gradient, 4.4 mmHg. The mean mitral valve gradient is 1.0 mmHg. Tricuspid Valve: The tricuspid valve is normal in structure. Tricuspid valve regurgitation is not demonstrated. No evidence of tricuspid stenosis. Aortic Valve: The aortic valve is normal in structure. Aortic valve regurgitation is not visualized. No aortic stenosis is present. Aortic valve mean gradient measures 2.0 mmHg. Aortic valve peak gradient measures 3.6 mmHg. Aortic valve area, by VTI measures 2.34 cm. Pulmonic Valve: The pulmonic valve was normal in structure. Pulmonic valve regurgitation is not visualized. No evidence of pulmonic stenosis. Aorta: The aortic root is normal in size and structure. Venous: The inferior vena cava is dilated in size with less than 50% respiratory variability, suggesting right atrial pressure of 15 mmHg. IAS/Shunts: No atrial level shunt detected by color flow Doppler.  LEFT VENTRICLE PLAX 2D LVIDd:         6.16 cm  Diastology LVIDs:         5.16 cm  LV e' medial:    5.55 cm/s LV PW:         1.23 cm  LV E/e' medial:  19.9 LV IVS:        1.15 cm  LV e' lateral:   5.66 cm/s LVOT diam:     1.90 cm  LV E/e' lateral: 19.5 LV SV:         39 LV SV Index:   17       2D Longitudinal Strain LVOT Area:     2.84 cm 2D Strain GLS Avg:     -6.7 %  RIGHT VENTRICLE RV Basal diam:  3.82 cm TAPSE (M-mode): 1.3 cm LEFT  ATRIUM  Index       RIGHT ATRIUM           Index LA diam:        5.00 cm 2.14 cm/m  RA Area:     18.50 cm LA Vol (A2C):   83.1 ml 35.61 ml/m RA Volume:   52.90 ml  22.67 ml/m LA Vol (A4C):   57.7 ml 24.72 ml/m LA Biplane Vol: 69.8 ml 29.91 ml/m  AORTIC VALVE                   PULMONIC VALVE AV Area (Vmax):    2.67 cm    PV Vmax:       0.67 m/s AV Area (Vmean):   2.43 cm    PV Vmean:      43.500 cm/s AV Area (VTI):     2.34 cm    PV VTI:        0.123 m AV Vmax:           94.80 cm/s  PV Peak grad:  1.8 mmHg AV Vmean:          66.700 cm/s PV Mean grad:  1.0 mmHg AV VTI:            0.165 m AV Peak Grad:      3.6 mmHg AV Mean Grad:      2.0 mmHg LVOT Vmax:         89.40 cm/s LVOT Vmean:        57.200 cm/s LVOT VTI:          0.136 m LVOT/AV VTI ratio: 0.82  AORTA Ao Root diam: 3.40 cm MITRAL VALVE MV Area (PHT): 4.25 cm     SHUNTS MV Area VTI:   1.14 cm     Systemic VTI:  0.14 m MV Peak grad:  4.4 mmHg     Systemic Diam: 1.90 cm MV Mean grad:  1.0 mmHg MV Vmax:       1.05 m/s MV Vmean:      48.0 cm/s MV Decel Time: 178 msec MV E velocity: 110.33 cm/s Ida Rogue MD Electronically signed by Ida Rogue MD Signature Date/Time: 09/13/2020/11:05:46 AM    Final         Scheduled Meds:  carvedilol  6.25 mg Oral BID WC   feeding supplement (NEPRO CARB STEADY)  237 mL Oral BID BM   folic acid  1 mg Oral Daily   furosemide  40 mg Intravenous Q12H   gabapentin  200 mg Oral TID   heparin  5,000 Units Subcutaneous Q8H   hydrALAZINE  25 mg Oral TID   multivitamin with minerals  1 tablet Oral Daily   sodium chloride flush  3 mL Intravenous Q12H   thiamine  100 mg Oral Daily   Continuous Infusions:  sodium chloride      Assessment & Plan:   Active Problems:   Congestive heart failure (CHF) (HCC)   Dyspnea on exertion and bilateral lower extremity edema secondary to acute on chronic CHF decompensation:  Cardiology consulted Patient refused his Lasix this a.m. as above.  Try to talk  to him about taking and he still refusing as he does not want to interrupt his sleep by going to the bathroom all the time. 7/23- improving.  Echo EF 25-30% Creatine up , cardiology holding p.m. Lasix dose. Switch to oral in a.m. 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   Difficult to place patient No            LOS: 2 days   Time spent: 35 minutes with more than 50% on [place], MD [facility] Pager [phone]  If 7PM-7AM, please contact night-coverage 09/14/2020, 8:08 AM

## 2020-09-14 NOTE — TOC Initial Note (Signed)
Transition of Care [facility]) - Initial/Assessment Note    Patient Details  Name: Hector [name] MRN: [id number] Date of Birth: [birth year]  Transition of Care [facility]) CM/SW Contact:    [name], LCSW Phone Number: 09/14/2020, 3:09 PM  Clinical Narrative:   Pt is alert and oriented. Pt states he lives with his mother. Pt states he gets rides from friends and family members when he needs to go to a appointment. Pt states he is not established at [facility] clinic yet but has to call to make his first appointment. Pt states he will do this. Pt gets him meds from [facility]. Pt states he was not getting any in home services prior to admission. PT is not recommending any therapy in home services at this time. No additional needs at this time.  Pt has a scale and is aware to weigh himself daily and notify his doctor if any weight gain is indicated.                Expected Discharge Plan: Home/Self Care Barriers to Discharge: Continued Medical Work up   Patient Goals and CMS Choice Patient states their goals for this hospitalization and ongoing recovery are:: to go home   Choice offered to / list presented to : Patient  Expected Discharge Plan and Services Expected Discharge Plan: Home/Self Care In-house Referral: Clinical Social Work   Post Acute Care Choice: NA Living arrangements for the past 2 months: Single Family Home                                      Prior Living Arrangements/Services Living arrangements for the past 2 months: Single Family Home Lives with:: Parents Patient language and need for interpreter reviewed:: Yes Do you feel safe going back to the place where you live?: Yes      Need for Family Participation in Patient Care: Yes (Comment) Care giver support system in place?: Yes (comment)   Criminal Activity/Legal Involvement Pertinent to Current Situation/Hospitalization: No - Comment as needed  Activities of Daily  Living Home Assistive Devices/Equipment: CPAP ADL Screening (condition at time of admission) Patient's cognitive ability adequate to safely complete daily activities?: Yes Is the patient deaf or have difficulty hearing?: No Does the patient have difficulty seeing, even when wearing glasses/contacts?: No Does the patient have difficulty concentrating, remembering, or making decisions?: No Patient able to express need for assistance with ADLs?: No Does the patient have difficulty dressing or bathing?: No Independently performs ADLs?: Yes (appropriate for developmental age) Does the patient have difficulty walking or climbing stairs?: No Weakness of Legs: Left Weakness of Arms/Hands: Left  Permission Sought/Granted Permission sought to share information with : Family Supports Permission granted to share information with : Yes, Release of Information Signed  Share Information with NAME: nannie     Permission granted to share info w Relationship: mother     Emotional Assessment Appearance:: Appears stated age Attitude/Demeanor/Rapport: Engaged Affect (typically observed): Accepting Orientation: : Oriented to Self, Oriented to Place, Oriented to  Time, Oriented to Situation Alcohol / Substance Use: Not Applicable Psych Involvement: No (comment)  Admission diagnosis:  Congestive heart failure (CHF) (HCC) [I50.9] Chronic kidney disease, unspecified CKD stage [N18.9] Acute on chronic congestive heart failure, unspecified heart failure type [facility]) [I50.9] Patient Active Problem List   Diagnosis Date Noted   Congestive heart failure (CHF) ([place]) 09/12/2020  CKD (chronic kidney disease) stage 4, GFR 15-29 ml/min ([name]) 07/21/2020   GIB (gastrointestinal bleeding) 07/21/2020   Acute blood loss anemia 07/21/2020   Hyperthyroidism without thyroid nodule 02/03/2020   Acute respiratory distress syndrome (ARDS) due to COVID-19 virus ([name]) 12/31/2019   Acute renal failure superimposed on stage 3b  chronic kidney disease ([name]) 12/31/2019   Hyperkalemia 12/31/2019   Hyponatremia 12/31/2019   Obstructive sleep apnea 04/21/2016   Nonischemic cardiomyopathy ([place]) 05/26/2015   Chronic combined systolic (congestive) and diastolic (congestive) heart failure ([name]) 04/01/2015   Essential hypertension 04/01/2015   Sinus bradycardia 01/29/2015   Paroxysmal atrial fibrillation ([name]) 08/16/2014   Paroxysmal atrial flutter ([name]) 08/14/2014    Class: Acute   Chronic kidney disease (CKD), stage III (moderate) ([name]) 07/02/2014   Hypertensive heart disease    Hyperlipidemia    PCP:  [name], MD Pharmacy:   [place], [place] - [place] [address] Phone: [phone] Fax: [phone]  [place] [address]), [place] - [place] [address] Phone: [phone] Fax: [phone]     Social Determinants of Health (SDOH) Interventions    Readmission Risk Interventions Readmission Risk Prevention Plan 09/14/2020 02/01/2020 01/02/2020  Transportation Screening Complete Complete Complete  PCP or Specialist Appt within 5-7 Days - Complete -  PCP or Specialist Appt within 3-5 Days - - Complete  Home Care Screening - Complete -  Medication Review (RN CM) - Complete -  HRI or [place] - - Complete  Social Work Consult for Recovery Care Planning/Counseling - - Complete  Palliative Care Screening - - Not Applicable  Medication Review (RN Care Manager) Complete - Complete  PCP or Specialist appointment within 3-5 days of discharge Complete - -  HRI or Home Care Consult Complete - -  SW Recovery Care/Counseling Consult Complete - -  Palliative Care Screening Not Applicable - -  Skilled Nursing Facility Complete - -  Some recent data might be hidden

## 2020-09-14 NOTE — Progress Notes (Addendum)
Progress Note  Patient Name: Hector [name] Date of Encounter: 09/14/2020  [facility] Cardiologist: Ida Rogue, MD   Subjective   UOP -5.2L total. Scr/BUN mildly worse today. Patient reports he is feeling much better today. No SOB or chest pain. Bps better.  Inpatient Medications    Scheduled Meds:  carvedilol  6.25 mg Oral BID WC   feeding supplement (NEPRO CARB STEADY)  237 mL Oral BID BM   folic acid  1 mg Oral Daily   furosemide  40 mg Intravenous Q12H   gabapentin  200 mg Oral TID   heparin  5,000 Units Subcutaneous Q8H   hydrALAZINE  25 mg Oral TID   multivitamin with minerals  1 tablet Oral Daily   sodium chloride flush  3 mL Intravenous Q12H   thiamine  100 mg Oral Daily   Continuous Infusions:  sodium chloride     PRN Meds: sodium chloride, acetaminophen, hydrALAZINE, ondansetron (ZOFRAN) IV, sodium chloride flush   Vital Signs    Vitals:   09/13/20 2015 09/13/20 2221 09/14/20 0350 09/14/20 0732  BP: 121/88 114/81 105/76 110/76  Pulse: 66 62 (!) 58 64  Resp: (!) 22 (!) '21 20 20  '$ Temp: 98.3 F (36.8 C)  98.3 F (36.8 C) 98.2 F (36.8 C)  TempSrc: Oral  Oral   SpO2: 98%   98%  Weight:        Intake/Output Summary (Last 24 hours) at 09/14/2020 1006 Last data filed at 09/14/2020 0920 Gross per 24 hour  Intake 780 ml  Output 3200 ml  Net -2420 ml   Last 3 Weights 09/13/2020 07/22/2020 07/20/2020  Weight (lbs) 234 lb 12.8 oz 253 lb 15.5 oz 255 lb 11.7 oz  Weight (kg) 106.505 kg 115.2 kg 116 kg      Telemetry    NSR, PVC, HR 60s - Personally Reviewed  ECG    No new - Personally Reviewed  Physical Exam   GEN: No acute distress.   Neck: + JVD Cardiac: RRR, + murmur, rubs, or gallops.  Respiratory: Clear to auscultation bilaterally. GI: Soft, nontender, non-distended  MS: Trave edema; No deformity. Neuro:  Nonfocal  Psych: Normal affect   Labs    High Sensitivity Troponin:   Recent Labs  Lab 09/12/20 1320 09/12/20 1515   TROPONINIHS 54* 43*      Chemistry Recent Labs  Lab 09/12/20 1320 09/13/20 0717 09/14/20 0606  NA 137 137 141  K 4.7 4.1 3.8  CL 108 106 105  CO2 19* 19* 25  GLUCOSE 101* 91 97  BUN 29* 33* 35*  CREATININE 2.53* 2.47* 2.65*  CALCIUM 8.8* 8.8* 8.7*  GFRNONAA 29* 30* 27*  ANIONGAP '10 12 11     '$ Hematology Recent Labs  Lab 09/12/20 1320 09/13/20 0717  WBC 10.3 9.2  RBC 3.79* 4.05*  HGB 9.3* 10.0*  HCT 30.3* 32.4*  MCV 79.9* 80.0  MCH 24.5* 24.7*  MCHC 30.7 30.9  RDW 18.7* 18.9*  PLT 297 276    BNP Recent Labs  Lab 09/12/20 1320  BNP 3,375.0*     DDimer No results for input(s): DDIMER in the last 168 hours.   Radiology    DG Chest 2 View  Result Date: 09/12/2020 CLINICAL DATA:  Left-sided chest pain for several hours EXAM: CHEST - 2 VIEW COMPARISON:  06/19/2020 FINDINGS: Cardiac shadow is enlarged. Lungs are well aerated bilaterally. Stable appearing vascular congestion is noted without significant edema. No focal infiltrate is noted. No bony abnormality  is seen. IMPRESSION: Stable appearing vascular congestion. Electronically Signed   By: Inez Catalina M.D.   On: 09/12/2020 13:55   ECHOCARDIOGRAM COMPLETE  Result Date: 09/13/2020    ECHOCARDIOGRAM REPORT   Patient Name:   Hector [name] Date of Exam: 09/13/2020 Medical Rec #:  [id number]           Height:       71.0 in Accession #:    [id number]          Weight:       254.0 lb Date of Birth:  May 16, 1965           BSA:          2.334 m Patient Age:    55 years            BP:           152/114 mmHg Patient Gender: M                   HR:           64 bpm. Exam Location:  [facility] Procedure: 2D Echo, Color Doppler, Cardiac Doppler, Strain Analysis and            Intracardiac Opacification Agent Indications:     R06.00 Dyspnea  History:         Patient has prior history of Echocardiogram examinations, most                  recent 01/31/2020. CHF and Cardiomyopathy, CKD and Stroke; Risk                   Factors:Hypertension and Dyslipidemia. ETOH.  Sonographer:     Charmayne Sheer RDCS (AE) Referring Phys:  [place] Diagnosing Phys: Ida Rogue MD  Sonographer Comments: Suboptimal subcostal window. Global longitudinal strain was attempted. IMPRESSIONS  1. Left ventricular ejection fraction, by estimation, is 25 to 30%. The left ventricle has severely decreased function. The left ventricle demonstrates global hypokinesis. The left ventricular internal cavity size was mildly dilated. Left ventricular diastolic parameters are indeterminate. The average left ventricular global longitudinal strain is -6.7 %. The global longitudinal strain is abnormal.  2. Right ventricular systolic function is moderately reduced. The right ventricular size is normal. Tricuspid regurgitation signal is inadequate for assessing PA pressure.  3. The mitral valve is normal in structure. Moderate mitral valve regurgitation.  4. The inferior vena cava is dilated in size with <50% respiratory variability, suggesting right atrial pressure of 15 mmHg. FINDINGS  Left Ventricle: Left ventricular ejection fraction, by estimation, is 25 to 30%. The left ventricle has severely decreased function. The left ventricle demonstrates global hypokinesis. Definity contrast agent was given IV to delineate the left ventricular endocardial borders. The average left ventricular global longitudinal strain is -6.7 %. The global longitudinal strain is abnormal. The left ventricular internal cavity size was mildly dilated. There is no left ventricular hypertrophy. Left ventricular diastolic parameters are indeterminate. Right Ventricle: The right ventricular size is normal. No increase in right ventricular wall thickness. Right ventricular systolic function is moderately reduced. Tricuspid regurgitation signal is inadequate for assessing PA pressure. Left Atrium: Left atrial size was normal in size. Right Atrium: Right atrial size was normal in size.  Pericardium: There is no evidence of pericardial effusion. Mitral Valve: The mitral valve is normal in structure. Moderate mitral valve regurgitation. No evidence of mitral valve stenosis. MV peak gradient, 4.4 mmHg. The mean mitral valve gradient  is 1.0 mmHg. Tricuspid Valve: The tricuspid valve is normal in structure. Tricuspid valve regurgitation is not demonstrated. No evidence of tricuspid stenosis. Aortic Valve: The aortic valve is normal in structure. Aortic valve regurgitation is not visualized. No aortic stenosis is present. Aortic valve mean gradient measures 2.0 mmHg. Aortic valve peak gradient measures 3.6 mmHg. Aortic valve area, by VTI measures 2.34 cm. Pulmonic Valve: The pulmonic valve was normal in structure. Pulmonic valve regurgitation is not visualized. No evidence of pulmonic stenosis. Aorta: The aortic root is normal in size and structure. Venous: The inferior vena cava is dilated in size with less than 50% respiratory variability, suggesting right atrial pressure of 15 mmHg. IAS/Shunts: No atrial level shunt detected by color flow Doppler.  LEFT VENTRICLE PLAX 2D LVIDd:         6.16 cm  Diastology LVIDs:         5.16 cm  LV e' medial:    5.55 cm/s LV PW:         1.23 cm  LV E/e' medial:  19.9 LV IVS:        1.15 cm  LV e' lateral:   5.66 cm/s LVOT diam:     1.90 cm  LV E/e' lateral: 19.5 LV SV:         39 LV SV Index:   17       2D Longitudinal Strain LVOT Area:     2.84 cm 2D Strain GLS Avg:     -6.7 %  RIGHT VENTRICLE RV Basal diam:  3.82 cm TAPSE (M-mode): 1.3 cm LEFT ATRIUM             Index       RIGHT ATRIUM           Index LA diam:        5.00 cm 2.14 cm/m  RA Area:     18.50 cm LA Vol (A2C):   83.1 ml 35.61 ml/m RA Volume:   52.90 ml  22.67 ml/m LA Vol (A4C):   57.7 ml 24.72 ml/m LA Biplane Vol: 69.8 ml 29.91 ml/m  AORTIC VALVE                   PULMONIC VALVE AV Area (Vmax):    2.67 cm    PV Vmax:       0.67 m/s AV Area (Vmean):   2.43 cm    PV Vmean:      43.500 cm/s AV  Area (VTI):     2.34 cm    PV VTI:        0.123 m AV Vmax:           94.80 cm/s  PV Peak grad:  1.8 mmHg AV Vmean:          66.700 cm/s PV Mean grad:  1.0 mmHg AV VTI:            0.165 m AV Peak Grad:      3.6 mmHg AV Mean Grad:      2.0 mmHg LVOT Vmax:         89.40 cm/s LVOT Vmean:        57.200 cm/s LVOT VTI:          0.136 m LVOT/AV VTI ratio: 0.82  AORTA Ao Root diam: 3.40 cm MITRAL VALVE MV Area (PHT): 4.25 cm     SHUNTS MV Area VTI:   1.14 cm     Systemic VTI:  0.14 m MV  Peak grad:  4.4 mmHg     Systemic Diam: 1.90 cm MV Mean grad:  1.0 mmHg MV Vmax:       1.05 m/s MV Vmean:      48.0 cm/s MV Decel Time: 178 msec MV E velocity: 110.33 cm/s Ida Rogue MD Electronically signed by Ida Rogue MD Signature Date/Time: 09/13/2020/11:05:46 AM    Final     Cardiac Studies   Echo 09/13/20   1. Left ventricular ejection fraction, by estimation, is 25 to 30%. The  left ventricle has severely decreased function. The left ventricle  demonstrates global hypokinesis. The left ventricular internal cavity size  was mildly dilated. Left ventricular  diastolic parameters are indeterminate. The average left ventricular  global longitudinal strain is -6.7 %. The global longitudinal strain is  abnormal.   2. Right ventricular systolic function is moderately reduced. The right  ventricular size is normal. Tricuspid regurgitation signal is inadequate  for assessing PA pressure.   3. The mitral valve is normal in structure. Moderate mitral valve  regurgitation.   4. The inferior vena cava is dilated in size with <50% respiratory  variability, suggesting right atrial pressure of 15 mmHg.   Echo 01/2020 1. Left ventricular ejection fraction, by estimation, is 45 to 50%. The  left ventricle has normal function. The left ventricle demonstrates  regional wall motion abnormalities. Abnormal (paradoxical) septal motion,  consistent with left bundle branch  block. Left ventricular diastolic parameters are  indeterminate.   2. Right ventricular systolic function is normal. The right ventricular  size is normal. Tricuspid regurgitation signal is inadequate for assessing  PA pressure.   3. The mitral valve is normal in structure. No evidence of mitral valve  regurgitation.   4. The aortic valve was not well visualized. Aortic valve regurgitation  is not visualized. No aortic stenosis is present.   5. The inferior vena cava is normal in size with greater than 50%  respiratory variability, suggesting right atrial pressure of 3 mmHg.   LHC 2016 Angiographically minimal CAD Non-ishcemic Cardiomyopathy Systemic Hypertension with Mild-Moderately increase LVEDP after pre-cath hydration.     This confirms the presumed diagnosis of Non-ischemic Cardiomyopathy.   Plan: Discharge home after Bed-rest & TR band removal Continue current Cardiomyopathy Medications - BB, Entresto & Lasix Will refer back to Dr. Caryl Comes for reconsideration of ICD.  Patient Profile     55 y.o. 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  Assessment & Plan    Acute on Chronic combined CHF/NICM - presented with worsening sob, has not has his medications in 4 months, says it's difficult to get them. 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 He denies any recent alcohol use - Labs showed BNP elevated to 3000 and CXR with stable vascular congestion - Last echo showed LVEF 45-50%  Previously lower   - Echo this admission showed LVEF 25-30%, global HK, moderately reduced RV function, moderate MR - started on IV lasix '40mg'$  BID - He was previously on Coreg 6.'25mg'$  BID, hydralazine '25mg'$  QID>> restarted on admission - Hx low EF   No signif chest pain   Would resume meds and follow up as an  outpt by echo in a few months   (normal cornaries in 2016) - No  Ace/ARB/ARNI/SGLT2i with CKD - Net -3.2L overnight, total -5.2L - creatinine worse this AM, creatinine 2.47>2.65  HOld PM lasix  Oral in am    - strict I/Os, monitor Scr, and daily weights   Paroxysmal Afib - He is maintaining NSR - previously on coreg for rate control, Eliquis for stroke ppx, and amiodarone '200mg'$  daily - CHADSVAC at least 4 (CHF, HTN, DM2, PAD). On heparin injections - restart Eliqus - Coreg for rate control   Elevated troponin - mildly elevated with flat trend in the setting of acute CHF and uncontrolled HTN, suspect demand ischemia - EKG with no new changes - Heparin injections - Patient denied chest pain on my interview    HTN - BP elevated on admission and he was treated with IV labetalol in the ER - started on hydralazine and coreg - BPS improved   HLD - LDL 174 in 2020 - continue atorvastatin '80mg'$  daily   CKD stage 3 - Scr/BUN mildly up - continue to monitor with diuresis   Social Issues - consider social work consult for medication management  For questions or updates, please contact [place] Please consult [email or web address] for contact info under        Signed, Cadence Ninfa Meeker, PA-C  09/14/2020, 10:06 AM     Patient seen and examined   I have amended note by Read D[employer] above to reflect my findings  Pt appears comfortable laying in bed   Had signif diuresis yesterdya   Sl bump in Cr today ON exam: JVP mildly increased Lugns are CTA  Cardiac exam:  RRR  No S3 Ext are with triv edema  REcomm:   Would hold further lasix today  Check labs tomorrow Tomorrow start oral lasix   PRobable d/c tomorrow with close outpt follow   Pt may benefit from out patient care fund for meds/ assist so that he can be compliant on meds      Dorris Carnes MD

## 2020-09-15 ENCOUNTER — Other Ambulatory Visit: Payer: Self-pay | Admitting: Family Medicine

## 2020-09-15 DIAGNOSIS — I480 Paroxysmal atrial fibrillation: Secondary | ICD-10-CM | POA: Diagnosis not present

## 2020-09-15 DIAGNOSIS — N179 Acute kidney failure, unspecified: Secondary | ICD-10-CM

## 2020-09-15 DIAGNOSIS — N1832 Chronic kidney disease, stage 3b: Secondary | ICD-10-CM | POA: Diagnosis not present

## 2020-09-15 DIAGNOSIS — I5041 Acute combined systolic (congestive) and diastolic (congestive) heart failure: Secondary | ICD-10-CM | POA: Diagnosis not present

## 2020-09-15 DIAGNOSIS — I509 Heart failure, unspecified: Secondary | ICD-10-CM | POA: Diagnosis not present

## 2020-09-15 LAB — BASIC METABOLIC PANEL
Anion gap: 7 (ref 5–15)
BUN: 43 mg/dL — ABNORMAL HIGH (ref 6–20)
CO2: 26 mmol/L (ref 22–32)
Calcium: 8.7 mg/dL — ABNORMAL LOW (ref 8.9–10.3)
Chloride: 102 mmol/L (ref 98–111)
Creatinine, Ser: 2.84 mg/dL — ABNORMAL HIGH (ref 0.61–1.24)
GFR, Estimated: 25 mL/min — ABNORMAL LOW (ref >60)
Glucose, Bld: 135 mg/dL — ABNORMAL HIGH (ref 70–99)
Potassium: 3.8 mmol/L (ref 3.5–5.1)
Sodium: 135 mmol/L (ref 135–145)

## 2020-09-15 MED ORDER — BENZOCAINE 10 % MT GEL
Freq: Three times a day (TID) | OROMUCOSAL | Status: DC | PRN
  Administered 2020-09-15: 1 via OROMUCOSAL
  Filled 2020-09-15: qty 9

## 2020-09-15 NOTE — Progress Notes (Signed)
PROGRESS NOTE    [name]  [id number] DOB: [birth year] DOA: 09/12/2020 PCP: [name], MD    Brief Narrative:  Hector [name] is a 55 y.o. male with a past medical history of CKD 4, nonischemic cardiomyopathy, obstructive sleep apnea noncompliant on CPAP, essential hypertension, paroxysmal atrial fibrillation not on anticoagulation, CHF with mildly reduced ejection fraction of 45 to 50% based off echo on 12/21.  Patient presents to the emergency department with sudden onset of chest pain and shortness of breath that woke him up this morning.  The chest pain is located on the left side but he states he also has some left arm pain that originates from his neck. Denies any orthopnea.  Endorses significant dyspnea on exertion and paroxysmal nocturnal dyspnea.  He has had worsening lower extremity edema.  7/22-refusing getting more lasix this am as it causes his back to hurt and he is not getting any rest b/c he has to urinate too much  7/23-pt states LE edema improving, no sob. -5.2L UOP 7/24 no overnight issues. Creatinine up 2.84  Consultants:  cardiology  Procedures:   Antimicrobials:      Subjective: Sob with exertion. No cp  Objective: Vitals:   09/14/20 1945 09/15/20 0119 09/15/20 0402 09/15/20 0736  BP: 106/74  114/81 112/79  Pulse: 66  64 75  Resp: '18  20 20  '$ Temp: 98.6 F (37 C)  98.3 F (36.8 C) 98.7 F (37.1 C)  TempSrc: Oral   Oral  SpO2: 98%  98% 96%  Weight:  105.1 kg    Height:        Intake/Output Summary (Last 24 hours) at 09/15/2020 0832 Last data filed at 09/15/2020 0700 Gross per 24 hour  Intake 600 ml  Output 2800 ml  Net -2200 ml   Filed Weights   09/13/20 1130 09/13/20 1136 09/15/20 0119  Weight: 106.5 kg 106.5 kg 105.1 kg    Examination: Nad, calm Cta no w/r/r Regular s1/s2 no gallop Soft benign +bs No edema Aaxoxo4 grossly intact    Data Reviewed: I have personally reviewed following labs and imaging  studies  CBC: Recent Labs  Lab 09/12/20 1320 09/13/20 0717  WBC 10.3 9.2  HGB 9.3* 10.0*  HCT 30.3* 32.4*  MCV 79.9* 80.0  PLT 297 [id number]   Basic Metabolic Panel: Recent Labs  Lab 09/12/20 1320 09/13/20 0717 09/14/20 0606 09/15/20 0409  NA 137 137 141 135  K 4.7 4.1 3.8 3.8  CL 108 106 105 102  CO2 19* 19* 25 26  GLUCOSE 101* 91 97 135*  BUN 29* 33* 35* 43*  CREATININE 2.53* 2.47* 2.65* 2.84*  CALCIUM 8.8* 8.8* 8.7* 8.7*   GFR: Estimated Creatinine Clearance: 35.8 mL/min (A) (by C-G formula based on SCr of 2.84 mg/dL (H)). Liver Function Tests: No results for input(s): AST, ALT, ALKPHOS, BILITOT, PROT, ALBUMIN in the last 168 hours. No results for input(s): LIPASE, AMYLASE in the last 168 hours. No results for input(s): AMMONIA in the last 168 hours. Coagulation Profile: No results for input(s): INR, PROTIME in the last 168 hours. Cardiac Enzymes: No results for input(s): CKTOTAL, CKMB, CKMBINDEX, TROPONINI in the last 168 hours. BNP (last 3 results) No results for input(s): PROBNP in the last 8760 hours. HbA1C: No results for input(s): HGBA1C in the last 72 hours. CBG: No results for input(s): GLUCAP in the last 168 hours. Lipid Profile: No results for input(s): CHOL, HDL, LDLCALC, TRIG, CHOLHDL, LDLDIRECT in the last 72 hours.  Thyroid Function Tests: No results for input(s): TSH, T4TOTAL, FREET4, T3FREE, THYROIDAB in the last 72 hours. Anemia Panel: No results for input(s): VITAMINB12, FOLATE, FERRITIN, TIBC, IRON, RETICCTPCT in the last 72 hours. Sepsis Labs: No results for input(s): PROCALCITON, LATICACIDVEN in the last 168 hours.  Recent Results (from the past 240 hour(s))  SARS CORONAVIRUS 2 (TAT 6-24 HRS) Nasopharyngeal Nasopharyngeal Swab     Status: None   Collection Time: 09/12/20  3:49 PM   Specimen: Nasopharyngeal Swab  Result Value Ref Range Status   SARS Coronavirus 2 NEGATIVE NEGATIVE Final    Comment: (NOTE) SARS-CoV-2 target nucleic acids are  NOT DETECTED.  The SARS-CoV-2 RNA is generally detectable in upper and lower respiratory specimens during the acute phase of infection. Negative results do not preclude SARS-CoV-2 infection, do not rule out co-infections with other pathogens, and should not be used as the sole basis for treatment or other patient management decisions. Negative results must be combined with clinical observations, patient history, and epidemiological information. The expected result is Negative.  Fact Sheet for Patients: SugarRoll.be  Fact Sheet for Healthcare Providers: https://www.woods-mathews.com/  This test is not yet approved or cleared by the [place] FDA and  has been authorized for detection and/or diagnosis of SARS-CoV-2 by FDA under an Emergency Use Authorization (EUA). This EUA will remain  in effect (meaning this test can be used) for the duration of the COVID-19 declaration under Se ction 564(b)(1) of the Act, 21 U.S.C. section 360bbb-3(b)(1), unless the authorization is terminated or revoked sooner.  Performed at [facility], [place] [address] [place], [place] [address]          Radiology Studies: ECHOCARDIOGRAM COMPLETE  Result Date: 09/13/2020    ECHOCARDIOGRAM REPORT   Patient Name:   Hector [name] Date of Exam: 09/13/2020 Medical Rec #:  [id number]           Height:       71.0 in Accession #:    [id number]          Weight:       254.0 lb Date of Birth:  [birth year]           BSA:          2.334 m Patient Age:    13 years            BP:           152/114 mmHg Patient Gender: M                   HR:           64 bpm. Exam Location:  [facility] Procedure: 2D Echo, Color Doppler, Cardiac Doppler, Strain Analysis and            Intracardiac Opacification Agent Indications:     R06.00 Dyspnea  History:         Patient has prior history of Echocardiogram examinations, most                  recent 01/31/2020. CHF and Cardiomyopathy, CKD and  Stroke; Risk                  Factors:Hypertension and Dyslipidemia. ETOH.  Sonographer:     Charmayne Sheer RDCS (AE) Referring Phys:  [place] Diagnosing Phys: Ida Rogue MD  Sonographer Comments: Suboptimal subcostal window. Global longitudinal strain was attempted. IMPRESSIONS  1. Left ventricular ejection fraction, by estimation, is 25 to 30%.  The left ventricle has severely decreased function. The left ventricle demonstrates global hypokinesis. The left ventricular internal cavity size was mildly dilated. Left ventricular diastolic parameters are indeterminate. The average left ventricular global longitudinal strain is -6.7 %. The global longitudinal strain is abnormal.  2. Right ventricular systolic function is moderately reduced. The right ventricular size is normal. Tricuspid regurgitation signal is inadequate for assessing PA pressure.  3. The mitral valve is normal in structure. Moderate mitral valve regurgitation.  4. The inferior vena cava is dilated in size with <50% respiratory variability, suggesting right atrial pressure of 15 mmHg. FINDINGS  Left Ventricle: Left ventricular ejection fraction, by estimation, is 25 to 30%. The left ventricle has severely decreased function. The left ventricle demonstrates global hypokinesis. Definity contrast agent was given IV to delineate the left ventricular endocardial borders. The average left ventricular global longitudinal strain is -6.7 %. The global longitudinal strain is abnormal. The left ventricular internal cavity size was mildly dilated. There is no left ventricular hypertrophy. Left ventricular diastolic parameters are indeterminate. Right Ventricle: The right ventricular size is normal. No increase in right ventricular wall thickness. Right ventricular systolic function is moderately reduced. Tricuspid regurgitation signal is inadequate for assessing PA pressure. Left Atrium: Left atrial size was normal in size. Right Atrium: Right atrial  size was normal in size. Pericardium: There is no evidence of pericardial effusion. Mitral Valve: The mitral valve is normal in structure. Moderate mitral valve regurgitation. No evidence of mitral valve stenosis. MV peak gradient, 4.4 mmHg. The mean mitral valve gradient is 1.0 mmHg. Tricuspid Valve: The tricuspid valve is normal in structure. Tricuspid valve regurgitation is not demonstrated. No evidence of tricuspid stenosis. Aortic Valve: The aortic valve is normal in structure. Aortic valve regurgitation is not visualized. No aortic stenosis is present. Aortic valve mean gradient measures 2.0 mmHg. Aortic valve peak gradient measures 3.6 mmHg. Aortic valve area, by VTI measures 2.34 cm. Pulmonic Valve: The pulmonic valve was normal in structure. Pulmonic valve regurgitation is not visualized. No evidence of pulmonic stenosis. Aorta: The aortic root is normal in size and structure. Venous: The inferior vena cava is dilated in size with less than 50% respiratory variability, suggesting right atrial pressure of 15 mmHg. IAS/Shunts: No atrial level shunt detected by color flow Doppler.  LEFT VENTRICLE PLAX 2D LVIDd:         6.16 cm  Diastology LVIDs:         5.16 cm  LV e' medial:    5.55 cm/s LV PW:         1.23 cm  LV E/e' medial:  19.9 LV IVS:        1.15 cm  LV e' lateral:   5.66 cm/s LVOT diam:     1.90 cm  LV E/e' lateral: 19.5 LV SV:         39 LV SV Index:   17       2D Longitudinal Strain LVOT Area:     2.84 cm 2D Strain GLS Avg:     -6.7 %  RIGHT VENTRICLE RV Basal diam:  3.82 cm TAPSE (M-mode): 1.3 cm LEFT ATRIUM             Index       RIGHT ATRIUM           Index LA diam:        5.00 cm 2.14 cm/m  RA Area:     18.50 cm LA Vol (A2C):   83.1  ml 35.61 ml/m RA Volume:   52.90 ml  22.67 ml/m LA Vol (A4C):   57.7 ml 24.72 ml/m LA Biplane Vol: 69.8 ml 29.91 ml/m  AORTIC VALVE                   PULMONIC VALVE AV Area (Vmax):    2.67 cm    PV Vmax:       0.67 m/s AV Area (Vmean):   2.43 cm    PV  Vmean:      43.500 cm/s AV Area (VTI):     2.34 cm    PV VTI:        0.123 m AV Vmax:           94.80 cm/s  PV Peak grad:  1.8 mmHg AV Vmean:          66.700 cm/s PV Mean grad:  1.0 mmHg AV VTI:            0.165 m AV Peak Grad:      3.6 mmHg AV Mean Grad:      2.0 mmHg LVOT Vmax:         89.40 cm/s LVOT Vmean:        57.200 cm/s LVOT VTI:          0.136 m LVOT/AV VTI ratio: 0.82  AORTA Ao Root diam: 3.40 cm MITRAL VALVE MV Area (PHT): 4.25 cm     SHUNTS MV Area VTI:   1.14 cm     Systemic VTI:  0.14 m MV Peak grad:  4.4 mmHg     Systemic Diam: 1.90 cm MV Mean grad:  1.0 mmHg MV Vmax:       1.05 m/s MV Vmean:      48.0 cm/s MV Decel Time: 178 msec MV E velocity: 110.33 cm/s Ida Rogue MD Electronically signed by Ida Rogue MD Signature Date/Time: 09/13/2020/11:05:46 AM    Final         Scheduled Meds:  apixaban  5 mg Oral BID   carvedilol  6.25 mg Oral BID WC   feeding supplement (NEPRO CARB STEADY)  237 mL Oral BID BM   folic acid  1 mg Oral Daily   gabapentin  200 mg Oral TID   hydrALAZINE  25 mg Oral TID   multivitamin with minerals  1 tablet Oral Daily   pneumococcal 23 valent vaccine  0.5 mL Intramuscular Tomorrow-1000   sodium chloride flush  3 mL Intravenous Q12H   thiamine  100 mg Oral Daily   Continuous Infusions:  sodium chloride      Assessment & Plan:   Active Problems:   Congestive heart failure (CHF) (HCC)   Dyspnea on exertion and bilateral lower extremity edema secondary to acute on chronic CHF decompensation:  Cardiology consulted Patient refused his Lasix this a.m. as above.  Try to talk to him about taking and he still refusing as he does not want to interrupt his sleep by going to the bathroom all the time. Echo EF 25-30% 7/24 creatinine up today.  Cardiology discontinued Lasix. Check renal function in a.m. if better can restart oral Lasix Strict I's and O's Daily weight      Paroxysmal atrial fibrillation:  Currently in sinus rhythm Previously  on Coreg for rate control, Eliquis for stroke prophylaxis and amiodarone 200 mg daily 7/24 continue Eliquis  Continue Coreg  Discussed compliance with patient about his meds  TOC consulted    Uncontrolled hypertension:  Improved Continue hydralazine  and Coreg     chest pain likely secondary to hypertensive urgency/emergency:  BP control Cardiology following  Chronically elevated troponins:  Mildly more elevated than baseline, likely due to demand ischemia    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 Iv lasix held Monitor renal function in am  DVT prophylaxis: Eliquis Code Status: Full Family Communication: None at bedside Disposition Plan:  Status is: Inpatient  Remains inpatient appropriate because:Inpatient level of care appropriate due to severity of illness  Dispo: The patient is from: Home              Anticipated d/c is to: Home              Patient currently is not medically stable to d/c.   Difficult to place patient No            LOS: 3 days   Time spent: 35 minutes with more than 50% on B and E, MD [facility] Pager [phone]  If 7PM-7AM, please contact night-coverage 09/15/2020, 8:32 AM

## 2020-09-15 NOTE — Progress Notes (Addendum)
Progress Note  Patient Name: Hector [name] Date of Encounter: 09/15/2020  [facility] Cardiologist: Ida Rogue, MD   Subjective   UOP -2,4L. Creatinine/BUN up again today, hold lasix. Patient reports good breathing, he is up and about his room. No chest pain.   Inpatient Medications    Scheduled Meds:  apixaban  5 mg Oral BID   carvedilol  6.25 mg Oral BID WC   feeding supplement (NEPRO CARB STEADY)  237 mL Oral BID BM   folic acid  1 mg Oral Daily   furosemide  40 mg Oral BID   gabapentin  200 mg Oral TID   hydrALAZINE  25 mg Oral TID   multivitamin with minerals  1 tablet Oral Daily   pneumococcal 23 valent vaccine  0.5 mL Intramuscular Tomorrow-1000   sodium chloride flush  3 mL Intravenous Q12H   thiamine  100 mg Oral Daily   Continuous Infusions:  sodium chloride     PRN Meds: sodium chloride, acetaminophen, benzocaine, ondansetron (ZOFRAN) IV, sodium chloride flush   Vital Signs    Vitals:   09/14/20 1945 09/15/20 0119 09/15/20 0402 09/15/20 0736  BP: 106/74  114/81 112/79  Pulse: 66  64 75  Resp: '18  20 20  '$ Temp: 98.6 F (37 C)  98.3 F (36.8 C) 98.7 F (37.1 C)  TempSrc: Oral   Oral  SpO2: 98%  98% 96%  Weight:  105.1 kg    Height:        Intake/Output Summary (Last 24 hours) at 09/15/2020 0802 Last data filed at 09/15/2020 0700 Gross per 24 hour  Intake 600 ml  Output 2800 ml  Net -2200 ml   Last 3 Weights 09/15/2020 09/13/2020 09/13/2020  Weight (lbs) 231 lb 11.3 oz 234 lb 12.8 oz 234 lb 12.6 oz  Weight (kg) 105.1 kg 106.505 kg 106.5 kg      Telemetry    NSR, HR 60-70s, PVCs - Personally Reviewed  ECG    NO new - Personally Reviewed  Physical Exam   GEN: No acute distress.   Neck: No JVD Cardiac: RRR, no murmurs, rubs, or gallops.  Respiratory: Clear to auscultation bilaterally. GI: Soft, nontender, non-distended  MS: No edema; No deformity. Neuro:  Nonfocal  Psych: Normal affect   Labs    High Sensitivity  Troponin:   Recent Labs  Lab 09/12/20 1320 09/12/20 1515  TROPONINIHS 54* 43*      Chemistry Recent Labs  Lab 09/13/20 0717 09/14/20 0606 09/15/20 0409  NA 137 141 135  K 4.1 3.8 3.8  CL 106 105 102  CO2 19* 25 26  GLUCOSE 91 97 135*  BUN 33* 35* 43*  CREATININE 2.47* 2.65* 2.84*  CALCIUM 8.8* 8.7* 8.7*  GFRNONAA 30* 27* 25*  ANIONGAP '12 11 7     '$ Hematology Recent Labs  Lab 09/12/20 1320 09/13/20 0717  WBC 10.3 9.2  RBC 3.79* 4.05*  HGB 9.3* 10.0*  HCT 30.3* 32.4*  MCV 79.9* 80.0  MCH 24.5* 24.7*  MCHC 30.7 30.9  RDW 18.7* 18.9*  PLT 297 276    BNP Recent Labs  Lab 09/12/20 1320  BNP 3,375.0*     DDimer No results for input(s): DDIMER in the last 168 hours.   Radiology    ECHOCARDIOGRAM COMPLETE  Result Date: 09/13/2020    ECHOCARDIOGRAM REPORT   Patient Name:   Hector [name] Date of Exam: 09/13/2020 Medical Rec #:  [id number]  Height:       71.0 in Accession #:    [id number]          Weight:       254.0 lb Date of Birth:  1965/10/26           BSA:          2.334 m Patient Age:    55 years            BP:           152/114 mmHg Patient Gender: M                   HR:           64 bpm. Exam Location:  [facility] Procedure: 2D Echo, Color Doppler, Cardiac Doppler, Strain Analysis and            Intracardiac Opacification Agent Indications:     R06.00 Dyspnea  History:         Patient has prior history of Echocardiogram examinations, most                  recent 01/31/2020. CHF and Cardiomyopathy, CKD and Stroke; Risk                  Factors:Hypertension and Dyslipidemia. ETOH.  Sonographer:     Charmayne Sheer RDCS (AE) Referring Phys:  [place] Diagnosing Phys: Ida Rogue MD  Sonographer Comments: Suboptimal subcostal window. Global longitudinal strain was attempted. IMPRESSIONS  1. Left ventricular ejection fraction, by estimation, is 25 to 30%. The left ventricle has severely decreased function. The left ventricle demonstrates global  hypokinesis. The left ventricular internal cavity size was mildly dilated. Left ventricular diastolic parameters are indeterminate. The average left ventricular global longitudinal strain is -6.7 %. The global longitudinal strain is abnormal.  2. Right ventricular systolic function is moderately reduced. The right ventricular size is normal. Tricuspid regurgitation signal is inadequate for assessing PA pressure.  3. The mitral valve is normal in structure. Moderate mitral valve regurgitation.  4. The inferior vena cava is dilated in size with <50% respiratory variability, suggesting right atrial pressure of 15 mmHg. FINDINGS  Left Ventricle: Left ventricular ejection fraction, by estimation, is 25 to 30%. The left ventricle has severely decreased function. The left ventricle demonstrates global hypokinesis. Definity contrast agent was given IV to delineate the left ventricular endocardial borders. The average left ventricular global longitudinal strain is -6.7 %. The global longitudinal strain is abnormal. The left ventricular internal cavity size was mildly dilated. There is no left ventricular hypertrophy. Left ventricular diastolic parameters are indeterminate. Right Ventricle: The right ventricular size is normal. No increase in right ventricular wall thickness. Right ventricular systolic function is moderately reduced. Tricuspid regurgitation signal is inadequate for assessing PA pressure. Left Atrium: Left atrial size was normal in size. Right Atrium: Right atrial size was normal in size. Pericardium: There is no evidence of pericardial effusion. Mitral Valve: The mitral valve is normal in structure. Moderate mitral valve regurgitation. No evidence of mitral valve stenosis. MV peak gradient, 4.4 mmHg. The mean mitral valve gradient is 1.0 mmHg. Tricuspid Valve: The tricuspid valve is normal in structure. Tricuspid valve regurgitation is not demonstrated. No evidence of tricuspid stenosis. Aortic Valve: The  aortic valve is normal in structure. Aortic valve regurgitation is not visualized. No aortic stenosis is present. Aortic valve mean gradient measures 2.0 mmHg. Aortic valve peak gradient measures 3.6 mmHg. Aortic valve area, by VTI  measures 2.34 cm. Pulmonic Valve: The pulmonic valve was normal in structure. Pulmonic valve regurgitation is not visualized. No evidence of pulmonic stenosis. Aorta: The aortic root is normal in size and structure. Venous: The inferior vena cava is dilated in size with less than 50% respiratory variability, suggesting right atrial pressure of 15 mmHg. IAS/Shunts: No atrial level shunt detected by color flow Doppler.  LEFT VENTRICLE PLAX 2D LVIDd:         6.16 cm  Diastology LVIDs:         5.16 cm  LV e' medial:    5.55 cm/s LV PW:         1.23 cm  LV E/e' medial:  19.9 LV IVS:        1.15 cm  LV e' lateral:   5.66 cm/s LVOT diam:     1.90 cm  LV E/e' lateral: 19.5 LV SV:         39 LV SV Index:   17       2D Longitudinal Strain LVOT Area:     2.84 cm 2D Strain GLS Avg:     -6.7 %  RIGHT VENTRICLE RV Basal diam:  3.82 cm TAPSE (M-mode): 1.3 cm LEFT ATRIUM             Index       RIGHT ATRIUM           Index LA diam:        5.00 cm 2.14 cm/m  RA Area:     18.50 cm LA Vol (A2C):   83.1 ml 35.61 ml/m RA Volume:   52.90 ml  22.67 ml/m LA Vol (A4C):   57.7 ml 24.72 ml/m LA Biplane Vol: 69.8 ml 29.91 ml/m  AORTIC VALVE                   PULMONIC VALVE AV Area (Vmax):    2.67 cm    PV Vmax:       0.67 m/s AV Area (Vmean):   2.43 cm    PV Vmean:      43.500 cm/s AV Area (VTI):     2.34 cm    PV VTI:        0.123 m AV Vmax:           94.80 cm/s  PV Peak grad:  1.8 mmHg AV Vmean:          66.700 cm/s PV Mean grad:  1.0 mmHg AV VTI:            0.165 m AV Peak Grad:      3.6 mmHg AV Mean Grad:      2.0 mmHg LVOT Vmax:         89.40 cm/s LVOT Vmean:        57.200 cm/s LVOT VTI:          0.136 m LVOT/AV VTI ratio: 0.82  AORTA Ao Root diam: 3.40 cm MITRAL VALVE MV Area (PHT): 4.25 cm      SHUNTS MV Area VTI:   1.14 cm     Systemic VTI:  0.14 m MV Peak grad:  4.4 mmHg     Systemic Diam: 1.90 cm MV Mean grad:  1.0 mmHg MV Vmax:       1.05 m/s MV Vmean:      48.0 cm/s MV Decel Time: 178 msec MV E velocity: 110.33 cm/s Ida Rogue MD Electronically signed by Ida Rogue MD Signature Date/Time: 09/13/2020/11:05:46 AM  Final     Cardiac Studies    Echo 09/13/20   1. Left ventricular ejection fraction, by estimation, is 25 to 30%. The  left ventricle has severely decreased function. The left ventricle  demonstrates global hypokinesis. The left ventricular internal cavity size  was mildly dilated. Left ventricular  diastolic parameters are indeterminate. The average left ventricular  global longitudinal strain is -6.7 %. The global longitudinal strain is  abnormal.   2. Right ventricular systolic function is moderately reduced. The right  ventricular size is normal. Tricuspid regurgitation signal is inadequate  for assessing PA pressure.   3. The mitral valve is normal in structure. Moderate mitral valve  regurgitation.   4. The inferior vena cava is dilated in size with <50% respiratory  variability, suggesting right atrial pressure of 15 mmHg.   Echo 01/2020 1. Left ventricular ejection fraction, by estimation, is 45 to 50%. The  left ventricle has normal function. The left ventricle demonstrates  regional wall motion abnormalities. Abnormal (paradoxical) septal motion,  consistent with left bundle branch  block. Left ventricular diastolic parameters are indeterminate.   2. Right ventricular systolic function is normal. The right ventricular  size is normal. Tricuspid regurgitation signal is inadequate for assessing  PA pressure.   3. The mitral valve is normal in structure. No evidence of mitral valve  regurgitation.   4. The aortic valve was not well visualized. Aortic valve regurgitation  is not visualized. No aortic stenosis is present.   5. The inferior vena  cava is normal in size with greater than 50%  respiratory variability, suggesting right atrial pressure of 3 mmHg.   LHC 2016 Angiographically minimal CAD Non-ishcemic Cardiomyopathy Systemic Hypertension with Mild-Moderately increase LVEDP after pre-cath hydration.     This confirms the presumed diagnosis of Non-ischemic Cardiomyopathy.   Plan: Discharge home after Bed-rest & TR band removal Continue current Cardiomyopathy Medications - BB, Entresto & Lasix Will refer back to Dr. Caryl Comes for reconsideration of ICD.  Patient Profile     55 y.o. 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  Assessment & Plan    Acute on Chronic combined CHF/NICM - presented with worsening sob, has not has his medications in 4 months, says it's difficult to get them. 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 He denies any recent alcohol use - Labs showed BNP elevated to 3000 and CXR with stable vascular congestion - Last echo showed LVEF 45-50%  Previously lower   - Echo this admission showed LVEF 25-30%, global HK, moderately reduced RV function, moderate MR - started on IV lasix '40mg'$  BID - He was previously on Coreg 6.'25mg'$  BID, hydralazine '25mg'$  QID>> restarted on admission - Hx low EF.  No chest pain   Would resume meds and follow up as an outpt by echo in a few months   (normal cornaries in 2016) - No Ace/ARB/ARNI/SGLT2i with CKD - UOP -3.2L, total -7.4L - creatinine worse this AM, I will hold lasix. IF kidney function is better tomorrow, can re-start oral lasix - strict I/Os, monitor Scr, and daily weights   Paroxysmal Afib - He is maintaining NSR - CHADSVAC at least 4 (CHF, HTN, DM2, PAD).  - continue Eliqus - Coreg for rate control   Elevated  troponin - mildly elevated with flat trend in the setting of acute CHF and uncontrolled HTN, suspect demand ischemia - EKG with no new changes   HTN - BP elevated on admission and he was treated with IV labetalol in the ER - started on hydralazine and coreg - BPS improved   HLD - LDL 174 in 2020 - continue atorvastatin '80mg'$  daily   CKD stage 3 - Scr/BUN mildly up, hold diuretics as above   Social Issues - social work consulted for medication management  For questions or updates, please contact [place] Please consult [email or web address] for contact info under        Signed, Cadence Ninfa Meeker, PA-C  09/15/2020, 8:02 AM     Patient seen and examined   I agree with findnigs as noted above    Pt sleeping comfortably on my arrival   Flat Denies SOB Neck:  JVP normal Lungs are CTA   Cardiac RRR  No S3    Ext are without edema  BP controlled   Would continue to hold lasix   Check Cr in Am   Will need to determine home dose  SW consult to help with medications  Hopefully help with compliance Probable d/c tomorrow  Dorris Carnes MD

## 2020-09-16 DIAGNOSIS — I5041 Acute combined systolic (congestive) and diastolic (congestive) heart failure: Secondary | ICD-10-CM | POA: Diagnosis not present

## 2020-09-16 DIAGNOSIS — I160 Hypertensive urgency: Secondary | ICD-10-CM

## 2020-09-16 DIAGNOSIS — I480 Paroxysmal atrial fibrillation: Secondary | ICD-10-CM | POA: Diagnosis not present

## 2020-09-16 DIAGNOSIS — N179 Acute kidney failure, unspecified: Secondary | ICD-10-CM | POA: Diagnosis not present

## 2020-09-16 LAB — RENAL FUNCTION PANEL
Albumin: 3.2 g/dL — ABNORMAL LOW (ref 3.5–5.0)
Anion gap: 7 (ref 5–15)
BUN: 43 mg/dL — ABNORMAL HIGH (ref 6–20)
CO2: 25 mmol/L (ref 22–32)
Calcium: 8.6 mg/dL — ABNORMAL LOW (ref 8.9–10.3)
Chloride: 103 mmol/L (ref 98–111)
Creatinine, Ser: 2.41 mg/dL — ABNORMAL HIGH (ref 0.61–1.24)
GFR, Estimated: 31 mL/min — ABNORMAL LOW (ref >60)
Glucose, Bld: 113 mg/dL — ABNORMAL HIGH (ref 70–99)
Phosphorus: 2.9 mg/dL (ref 2.5–4.6)
Potassium: 3.7 mmol/L (ref 3.5–5.1)
Sodium: 135 mmol/L (ref 135–145)

## 2020-09-16 MED ORDER — THIAMINE HCL 100 MG PO TABS: 100.0000 mg | ORAL_TABLET | Freq: Every day | ORAL | 0 refills | Status: DC

## 2020-09-16 MED ORDER — CARVEDILOL 6.25 MG PO TABS: 6.2500 mg | ORAL_TABLET | Freq: Two times a day (BID) | ORAL | Status: DC

## 2020-09-16 MED ORDER — FUROSEMIDE 40 MG PO TABS: 40.0000 mg | ORAL_TABLET | Freq: Every day | ORAL | 0 refills | Status: DC

## 2020-09-16 MED ORDER — APIXABAN 5 MG PO TABS: 5.0000 mg | ORAL_TABLET | Freq: Two times a day (BID) | ORAL | 0 refills | Status: DC

## 2020-09-16 MED ORDER — ADULT MULTIVITAMIN W/MINERALS CH: 1.0000 | ORAL_TABLET | Freq: Every day | ORAL | 0 refills | Status: DC

## 2020-09-16 MED ORDER — FOLIC ACID 1 MG PO TABS: 1.0000 mg | ORAL_TABLET | Freq: Every day | ORAL | 0 refills | Status: AC

## 2020-09-16 MED ORDER — FUROSEMIDE 40 MG PO TABS
40.0000 mg | ORAL_TABLET | Freq: Every day | ORAL | Status: DC
  Administered 2020-09-16: 40 mg via ORAL
  Filled 2020-09-16: qty 1

## 2020-09-16 MED ORDER — CARVEDILOL 6.25 MG PO TABS: 6.2500 mg | ORAL_TABLET | Freq: Two times a day (BID) | ORAL | 0 refills | Status: DC

## 2020-09-16 MED ORDER — HYDRALAZINE HCL 25 MG PO TABS: 25.0000 mg | ORAL_TABLET | Freq: Three times a day (TID) | ORAL | 0 refills | Status: DC

## 2020-09-16 NOTE — Progress Notes (Signed)
   Heart Failure Nurse Navigator Note  HFrEF 25 to 30%.   Met with patient today he is currently lying supine in bed in no acute distress.  He states that he is being discharged today.  Again talked about the importance of restricting his fluid intake to 64 ounces daily, along with sodium restriction and stressed the importance of weighing himself daily and recording.  He states that he will get the scale out of the box and make sure that the battery is working start to use it on a daily basis.  And also stressed being pliant with his medications.  Reminded of his follow-up appointment with Darylene Price on August 5 at 1 PM.   Pricilla Riffle RN [facility]

## 2020-09-16 NOTE — Progress Notes (Signed)
CSW has arranged [facility], they will call RN upon arrival (eta 2:26). CSW to email [facility] rider waiver consent form.   No other discharge needs identified at this time.   [place], [place]

## 2020-09-16 NOTE — Discharge Summary (Signed)
Hector [name] [id number] DOB: 1965/10/15 DOA: 09/12/2020  PCP: [name], MD  Admit date: 09/12/2020 Discharge date: 09/16/2020  Admitted From: Home Disposition: Home  Recommendations for Outpatient Follow-up:  Follow up with PCP in 1 week Please obtain BMP/CBC in one week Please follow up with cardiology in 1 week     Discharge Condition:Stable CODE STATUS:full  Diet recommendation: Heart Healthy  Brief/Interim Summary: Per [id number] B Hector [name] is a 55 y.o. male with a past medical history of CKD 4, nonischemic cardiomyopathy, obstructive sleep apnea noncompliant on CPAP, essential hypertension, paroxysmal atrial fibrillation not on anticoagulation, CHF with mildly reduced ejection fraction of 45 to 50% based off echo on 12/21.  Patient presented to the emergency department with sudden onset of chest pain and shortness of breath that woke him up this morning. Endorsed significant dyspnea on exertion and paroxysmal nocturnal dyspnea.  He has had worsening lower extremity edema. In the emergency department his vital signs are stable his blood pressure is elevated with systolics in the A999333 and diastolics in the [id number].  BNP elevated at 3300.  Chest x-ray shows vascular congestion.  Initial troponin is 54 but appears that his troponins are chronically elevated.  Creatinine 2.53 which appears to be around his baseline.. saturating well on room air at 98%.  He was admitted to the hospital.  Cardiology was consulted.  He was started on IV Lasix.     Acute on chronic combined diastolic and systolic heart failure Cardiology was consulted Patient hais nt compliant with medication and likely his diet Echo EF 25-30% Was started on his cardiac meds Started initially on Lasix IV. His creatinine increased mildly and his IV Lasix was held and today he will be discharged on Lasix 40 mg daily He was counseled extensively on medical compliance Need to follow-up with cardiology as  outpatient for further HF management.  If his BP allows likely will need to be started on nitrates.       Paroxysmal atrial fibrillation:  Currently in sinus rhythm Previously on Coreg for rate control, Eliquis for stroke prophylaxis and amiodarone 200 mg daily Cardiology resumed his Coreg and Eliquis He will need to follow-up with cardiology as outpatient for further management    Uncontrolled hypertension:  Improved His medications were resumed, his hydralazine was increased           chest pain likely secondary to hypertensive urgency/emergency:  Blood pressure improved    Chronically elevated troponins: Mildly more elevated than baseline, likely due to demand ischemia Cardiology was following   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 Iv lasix held Renal function at baseline this AM.  Will discharge on Lasix 40 mg daily which cardiology is in agreement with.   Discharge Diagnoses:  Active Problems:   Congestive heart failure (CHF) [facility])    Discharge Instructions  Discharge Instructions     Amb Referral to HF Clinic   Complete by: As directed    Diet - low sodium heart healthy   Complete by: As directed    Discharge instructions   Complete by: As directed    Take meds   Increase activity slowly   Complete by: As directed       Allergies as of 09/16/2020       Reactions   Esomeprazole Magnesium Other (See Comments)   Suspected interstitial nephritis 2018   Eggs Or Egg-derived Products Rash        Medication List     STOP  taking these medications    allopurinol 100 MG tablet Commonly known as: ZYLOPRIM   amiodarone 200 MG tablet Commonly known as: PACERONE   Biofreeze Roll-On Colorless 4 % Gel Generic drug: Menthol (Topical Analgesic)   docusate sodium 100 MG capsule Commonly known as: COLACE   famotidine 40 MG tablet Commonly known as: PEPCID   feeding supplement (NEPRO CARB STEADY) Liqd   ondansetron 4 MG tablet Commonly  known as: ZOFRAN   PROSTAT PO   traMADol 50 MG tablet Commonly known as: Ultram       TAKE these medications    apixaban 5 MG Tabs tablet Commonly known as: ELIQUIS Take 1 tablet (5 mg total) by mouth 2 (two) times daily.   carvedilol 6.25 MG tablet Commonly known as: COREG Take 1 tablet (6.25 mg total) by mouth 2 (two) times daily with a meal.   ferrous gluconate 324 MG tablet Commonly known as: FERGON Take 324 mg by mouth daily with breakfast.   folic acid 1 MG tablet Commonly known as: FOLVITE Take 1 tablet (1 mg total) by mouth daily.   furosemide 40 MG tablet Commonly known as: LASIX Take 1 tablet (40 mg total) by mouth daily. What changed: when to take this   gabapentin 100 MG capsule Commonly known as: Neurontin Take 2 capsules (200 mg total) by mouth 3 (three) times daily.   hydrALAZINE 25 MG tablet Commonly known as: APRESOLINE Take 1 tablet (25 mg total) by mouth 3 (three) times daily. What changed: when to take this   multivitamin with minerals Tabs tablet Take 1 tablet by mouth daily.   thiamine 100 MG tablet Take 1 tablet (100 mg total) by mouth daily.   vitamin B-12 500 MCG tablet Commonly known as: CYANOCOBALAMIN Take 500 mcg by mouth daily.       ASK your doctor about these medications    atorvastatin 80 MG tablet Commonly known as: LIPITOR Take 1 tablet (80 mg total) by mouth daily.   levothyroxine 25 MCG tablet Commonly known as: SYNTHROID Take 25 mcg by mouth daily before breakfast.        Follow-up Information     Bender, Durene Cal, MD Follow up in 1 week(s).   Specialty: Family Medicine Contact information: [place] [address] [phone]         [name], MD Follow up in 1 week(s).   Specialty: Cardiology Contact information: [place] [address] [phone]                Allergies  Allergen Reactions   Esomeprazole  Magnesium Other (See Comments)    Suspected interstitial nephritis 2018   Eggs Or Egg-Derived Products Rash    Consultations: Cardiology   Procedures/Studies: DG Chest 2 View  Result Date: 09/12/2020 CLINICAL DATA:  Left-sided chest pain for several hours EXAM: CHEST - 2 VIEW COMPARISON:  06/19/2020 FINDINGS: Cardiac shadow is enlarged. Lungs are well aerated bilaterally. Stable appearing vascular congestion is noted without significant edema. No focal infiltrate is noted. No bony abnormality is seen. IMPRESSION: Stable appearing vascular congestion. Electronically Signed   By: Inez Catalina M.D.   On: 09/12/2020 13:55   CT Head Wo Contrast  Result Date: 08/27/2020 CLINICAL DATA:  Right-sided arm numbness and bilateral leg tingling. EXAM: CT HEAD WITHOUT CONTRAST TECHNIQUE: Contiguous axial images were obtained from the base of the skull through the vertex without intravenous contrast. COMPARISON:  MRI brain and CT  head dated June 19, 2020. FINDINGS: Brain: No evidence of acute infarction, hemorrhage, hydrocephalus, extra-axial collection or mass lesion/mass effect. Stable age advanced cerebral atrophy and chronic microvascular ischemic changes. Vascular: Calcified atherosclerosis. No hyperdense vessel. Skull: Normal. Negative for fracture or focal lesion. Sinuses/Orbits: No acute finding. Other: None. IMPRESSION: 1. No acute intracranial abnormality. 2. Stable age advanced cerebral atrophy and chronic microvascular ischemic changes. Electronically Signed   By: Titus Dubin M.D.   On: 08/27/2020 14:17   MR Cervical Spine Wo Contrast  Result Date: 08/27/2020 CLINICAL DATA:  Right-sided arm numbness and bilateral leg tingling. EXAM: MRI CERVICAL SPINE WITHOUT CONTRAST TECHNIQUE: Multiplanar, multisequence MR imaging of the cervical spine was performed. No intravenous contrast was administered. COMPARISON:  None. FINDINGS: Alignment: Reversal of the normal cervical lordosis.  No listhesis.  Vertebrae: No fracture, evidence of discitis, or bone lesion. Cord: Small foci of increased T2 signal within the right hemicord at C2 and C3 (series 10, images 1 and 5). Slightly diminutive caliber of the lower cervical cord. Posterior Fossa, vertebral arteries, paraspinal tissues: None. Disc levels: C2-C3: Small shallow central disc protrusion. Left facet joint ankylosis. No stenosis. C3-C4: Tiny shallow central disc protrusion. Moderate left facet uncovertebral hypertrophy. Mild left neuroforaminal stenosis. No spinal canal or right neuroforaminal stenosis. C4-C5: Tiny shallow central disc protrusion. Mild bilateral facet uncovertebral hypertrophy. No stenosis. C5-C6: Mild disc bulging. Moderate right facet arthropathy. No stenosis. C6-C7:  Negative. C7-T1: Small left paracentral disc protrusion contacting and flattening the left ventral cord. No stenosis. IMPRESSION: 1. Small foci of increased T2 signal within the right hemicord at C2 and C3 are nonspecific, but could reflect demyelinating disease. 2. Mild multilevel degenerative changes of the cervical spine as described above. Small left paracentral disc protrusion at C7-T1 contacting and flattening the left ventral cord. No high-grade stenosis or impingement. Electronically Signed   By: Titus Dubin M.D.   On: 08/27/2020 17:24   ECHOCARDIOGRAM COMPLETE  Result Date: 09/13/2020    ECHOCARDIOGRAM REPORT   Patient Name:   JEROMI ACHORD Date of Exam: 09/13/2020 Medical Rec #:  [id number]           Height:       71.0 in Accession #:    [id number]          Weight:       254.0 lb Date of Birth:  10-Feb-1966           BSA:          2.334 m Patient Age:    55 years            BP:           152/114 mmHg Patient Gender: M                   HR:           64 bpm. Exam Location:  [facility] Procedure: 2D Echo, Color Doppler, Cardiac Doppler, Strain Analysis and            Intracardiac Opacification Agent Indications:     R06.00 Dyspnea  History:         Patient has  prior history of Echocardiogram examinations, most                  recent 01/31/2020. CHF and Cardiomyopathy, CKD and Stroke; Risk                  Factors:Hypertension and Dyslipidemia. ETOH.  Sonographer:  Charmayne Sheer RDCS (AE) Referring Phys:  [place] Diagnosing Phys: Ida Rogue MD  Sonographer Comments: Suboptimal subcostal window. Global longitudinal strain was attempted. IMPRESSIONS  1. Left ventricular ejection fraction, by estimation, is 25 to 30%. The left ventricle has severely decreased function. The left ventricle demonstrates global hypokinesis. The left ventricular internal cavity size was mildly dilated. Left ventricular diastolic parameters are indeterminate. The average left ventricular global longitudinal strain is -6.7 %. The global longitudinal strain is abnormal.  2. Right ventricular systolic function is moderately reduced. The right ventricular size is normal. Tricuspid regurgitation signal is inadequate for assessing PA pressure.  3. The mitral valve is normal in structure. Moderate mitral valve regurgitation.  4. The inferior vena cava is dilated in size with <50% respiratory variability, suggesting right atrial pressure of 15 mmHg. FINDINGS  Left Ventricle: Left ventricular ejection fraction, by estimation, is 25 to 30%. The left ventricle has severely decreased function. The left ventricle demonstrates global hypokinesis. Definity contrast agent was given IV to delineate the left ventricular endocardial borders. The average left ventricular global longitudinal strain is -6.7 %. The global longitudinal strain is abnormal. The left ventricular internal cavity size was mildly dilated. There is no left ventricular hypertrophy. Left ventricular diastolic parameters are indeterminate. Right Ventricle: The right ventricular size is normal. No increase in right ventricular wall thickness. Right ventricular systolic function is moderately reduced. Tricuspid regurgitation signal  is inadequate for assessing PA pressure. Left Atrium: Left atrial size was normal in size. Right Atrium: Right atrial size was normal in size. Pericardium: There is no evidence of pericardial effusion. Mitral Valve: The mitral valve is normal in structure. Moderate mitral valve regurgitation. No evidence of mitral valve stenosis. MV peak gradient, 4.4 mmHg. The mean mitral valve gradient is 1.0 mmHg. Tricuspid Valve: The tricuspid valve is normal in structure. Tricuspid valve regurgitation is not demonstrated. No evidence of tricuspid stenosis. Aortic Valve: The aortic valve is normal in structure. Aortic valve regurgitation is not visualized. No aortic stenosis is present. Aortic valve mean gradient measures 2.0 mmHg. Aortic valve peak gradient measures 3.6 mmHg. Aortic valve area, by VTI measures 2.34 cm. Pulmonic Valve: The pulmonic valve was normal in structure. Pulmonic valve regurgitation is not visualized. No evidence of pulmonic stenosis. Aorta: The aortic root is normal in size and structure. Venous: The inferior vena cava is dilated in size with less than 50% respiratory variability, suggesting right atrial pressure of 15 mmHg. IAS/Shunts: No atrial level shunt detected by color flow Doppler.  LEFT VENTRICLE PLAX 2D LVIDd:         6.16 cm  Diastology LVIDs:         5.16 cm  LV e' medial:    5.55 cm/s LV PW:         1.23 cm  LV E/e' medial:  19.9 LV IVS:        1.15 cm  LV e' lateral:   5.66 cm/s LVOT diam:     1.90 cm  LV E/e' lateral: 19.5 LV SV:         39 LV SV Index:   17       2D Longitudinal Strain LVOT Area:     2.84 cm 2D Strain GLS Avg:     -6.7 %  RIGHT VENTRICLE RV Basal diam:  3.82 cm TAPSE (M-mode): 1.3 cm LEFT ATRIUM             Index       RIGHT  ATRIUM           Index LA diam:        5.00 cm 2.14 cm/m  RA Area:     18.50 cm LA Vol (A2C):   83.1 ml 35.61 ml/m RA Volume:   52.90 ml  22.67 ml/m LA Vol (A4C):   57.7 ml 24.72 ml/m LA Biplane Vol: 69.8 ml 29.91 ml/m  AORTIC VALVE                    PULMONIC VALVE AV Area (Vmax):    2.67 cm    PV Vmax:       0.67 m/s AV Area (Vmean):   2.43 cm    PV Vmean:      43.500 cm/s AV Area (VTI):     2.34 cm    PV VTI:        0.123 m AV Vmax:           94.80 cm/s  PV Peak grad:  1.8 mmHg AV Vmean:          66.700 cm/s PV Mean grad:  1.0 mmHg AV VTI:            0.165 m AV Peak Grad:      3.6 mmHg AV Mean Grad:      2.0 mmHg LVOT Vmax:         89.40 cm/s LVOT Vmean:        57.200 cm/s LVOT VTI:          0.136 m LVOT/AV VTI ratio: 0.82  AORTA Ao Root diam: 3.40 cm MITRAL VALVE MV Area (PHT): 4.25 cm     SHUNTS MV Area VTI:   1.14 cm     Systemic VTI:  0.14 m MV Peak grad:  4.4 mmHg     Systemic Diam: 1.90 cm MV Mean grad:  1.0 mmHg MV Vmax:       1.05 m/s MV Vmean:      48.0 cm/s MV Decel Time: 178 msec MV E velocity: 110.33 cm/s Ida Rogue MD Electronically signed by Ida Rogue MD Signature Date/Time: 09/13/2020/11:05:46 AM    Final       Subjective: Feels better.  Denies any worsening shortness of breath.  At baseline.  Discharge Exam: Vitals:   09/16/20 0338 09/16/20 0739  BP: 114/72 116/80  Pulse: 77 74  Resp: 20 17  Temp: 99.9 F (37.7 C) 98.3 F (36.8 C)  SpO2: 98% 97%   Vitals:   09/15/20 1716 09/15/20 2018 09/16/20 0338 09/16/20 0739  BP: 118/87 128/86 114/72 116/80  Pulse: 73 68 77 74  Resp: '20 20 20 17  '$ Temp: 99.1 F (37.3 C) 98.4 F (36.9 C) 99.9 F (37.7 C) 98.3 F (36.8 C)  TempSrc: Oral Oral Oral   SpO2: 96% 100% 98% 97%  Weight:   106.4 kg   Height:        General: Pt is alert, awake, not in acute distress Cardiovascular: RRR, S1/S2 +, no rubs, no gallops Respiratory: CTA bilaterally, no wheezing, no rhonchi Abdominal: Soft, NT, ND, bowel sounds + Extremities: no edema    The results of significant diagnostics from this hospitalization (including imaging, microbiology, ancillary and laboratory) are listed below for reference.     Microbiology: Recent Results (from the past 240 hour(s))  SARS  CORONAVIRUS 2 (TAT 6-24 HRS) Nasopharyngeal Nasopharyngeal Swab     Status: None   Collection Time: 09/12/20  3:49 PM   Specimen: Nasopharyngeal  Swab  Result Value Ref Range Status   SARS Coronavirus 2 NEGATIVE NEGATIVE Final    Comment: (NOTE) SARS-CoV-2 target nucleic acids are NOT DETECTED.  The SARS-CoV-2 RNA is generally detectable in upper and lower respiratory specimens during the acute phase of infection. Negative results do not preclude SARS-CoV-2 infection, do not rule out co-infections with other pathogens, and should not be used as the sole basis for treatment or other patient management decisions. Negative results must be combined with clinical observations, patient history, and epidemiological information. The expected result is Negative.  Fact Sheet for Patients: SugarRoll.be  Fact Sheet for Healthcare Providers: https://www.woods-mathews.com/  This test is not yet approved or cleared by the [place] FDA and  has been authorized for detection and/or diagnosis of SARS-CoV-2 by FDA under an Emergency Use Authorization (EUA). This EUA will remain  in effect (meaning this test can be used) for the duration of the COVID-19 declaration under Se ction 564(b)(1) of the Act, 21 U.S.C. section 360bbb-3(b)(1), unless the authorization is terminated or revoked sooner.  Performed at [facility], [place] [address], [place], [place] [address]      Labs: BNP (last 3 results) Recent Labs    06/16/20 1216 06/19/20 1528 09/12/20 1320  BNP 2,784.2* 2,925.1* A999333*   Basic Metabolic Panel: Recent Labs  Lab 09/12/20 1320 09/13/20 0717 09/14/20 0606 09/15/20 0409 09/16/20 0430  NA 137 137 141 135 135  K 4.7 4.1 3.8 3.8 3.7  CL 108 106 105 102 103  CO2 19* 19* '25 26 25  '$ GLUCOSE 101* 91 97 135* 113*  BUN 29* 33* 35* 43* 43*  CREATININE 2.53* 2.47* 2.65* 2.84* 2.41*  CALCIUM 8.8* 8.8* 8.7* 8.7* 8.6*  PHOS  --   --    --   --  2.9   Liver Function Tests: Recent Labs  Lab 09/16/20 0430  ALBUMIN 3.2*   No results for input(s): LIPASE, AMYLASE in the last 168 hours. No results for input(s): AMMONIA in the last 168 hours. CBC: Recent Labs  Lab 09/12/20 1320 09/13/20 0717  WBC 10.3 9.2  HGB 9.3* 10.0*  HCT 30.3* 32.4*  MCV 79.9* 80.0  PLT 297 276   Cardiac Enzymes: No results for input(s): CKTOTAL, CKMB, CKMBINDEX, TROPONINI in the last 168 hours. BNP: Invalid input(s): POCBNP CBG: No results for input(s): GLUCAP in the last 168 hours. D-Dimer No results for input(s): DDIMER in the last 72 hours. Hgb A1c No results for input(s): HGBA1C in the last 72 hours. Lipid Profile No results for input(s): CHOL, HDL, LDLCALC, TRIG, CHOLHDL, LDLDIRECT in the last 72 hours. Thyroid function studies No results for input(s): TSH, T4TOTAL, T3FREE, THYROIDAB in the last 72 hours.  Invalid input(s): FREET3 Anemia work up No results for input(s): VITAMINB12, FOLATE, FERRITIN, TIBC, IRON, RETICCTPCT in the last 72 hours. Urinalysis    Component Value Date/Time   COLORURINE YELLOW (A) 07/20/2020 2343   APPEARANCEUR CLEAR (A) 07/20/2020 2343   APPEARANCEUR Clear 02/26/2014 1126   LABSPEC 1.016 07/20/2020 2343   LABSPEC 1.008 02/26/2014 1126   PHURINE 5.0 07/20/2020 2343   GLUCOSEU NEGATIVE 07/20/2020 2343   GLUCOSEU Negative 02/26/2014 1126   HGBUR NEGATIVE 07/20/2020 2343   BILIRUBINUR NEGATIVE 07/20/2020 2343   BILIRUBINUR Negative 02/26/2014 [place] 07/20/2020 2343   PROTEINUR 100 (A) 07/20/2020 2343   NITRITE NEGATIVE 07/20/2020 2343   LEUKOCYTESUR TRACE (A) 07/20/2020 2343   LEUKOCYTESUR 1+ 02/26/2014 1126   Sepsis Labs Invalid input(s):  PROCALCITONIN,  WBC,  LACTICIDVEN Microbiology Recent Results (from the past 240 hour(s))  SARS CORONAVIRUS 2 (TAT 6-24 HRS) Nasopharyngeal Nasopharyngeal Swab     Status: None   Collection Time: 09/12/20  3:49 PM   Specimen:  Nasopharyngeal Swab  Result Value Ref Range Status   SARS Coronavirus 2 NEGATIVE NEGATIVE Final    Comment: (NOTE) SARS-CoV-2 target nucleic acids are NOT DETECTED.  The SARS-CoV-2 RNA is generally detectable in upper and lower respiratory specimens during the acute phase of infection. Negative results do not preclude SARS-CoV-2 infection, do not rule out co-infections with other pathogens, and should not be used as the sole basis for treatment or other patient management decisions. Negative results must be combined with clinical observations, patient history, and epidemiological information. The expected result is Negative.  Fact Sheet for Patients: SugarRoll.be  Fact Sheet for Healthcare Providers: https://www.woods-mathews.com/  This test is not yet approved or cleared by the [place] FDA and  has been authorized for detection and/or diagnosis of SARS-CoV-2 by FDA under an Emergency Use Authorization (EUA). This EUA will remain  in effect (meaning this test can be used) for the duration of the COVID-19 declaration under Se ction 564(b)(1) of the Act, 21 U.S.C. section 360bbb-3(b)(1), unless the authorization is terminated or revoked sooner.  Performed at [facility], [place] [address], [place], [place] [address]      Time coordinating discharge: Over 30 minutes  SIGNED:   Nolberto Hanlon, MD  [facility] 09/16/2020, 11:56 AM Pager   If 7PM-7AM, please contact night-coverage [email or web address] Password [email or web address]

## 2020-09-24 ENCOUNTER — Ambulatory Visit: Payer: Medicaid Other | Admitting: Family

## 2020-09-27 ENCOUNTER — Encounter: Payer: Self-pay | Admitting: Family

## 2020-09-27 ENCOUNTER — Ambulatory Visit: Payer: Medicaid Other | Attending: Family | Admitting: Family

## 2020-09-27 ENCOUNTER — Other Ambulatory Visit: Payer: Self-pay

## 2020-09-27 VITALS — BP 138/100 | HR 77 | Resp 18 | Ht 71.0 in | Wt 238.4 lb

## 2020-09-27 DIAGNOSIS — I5022 Chronic systolic (congestive) heart failure: Secondary | ICD-10-CM

## 2020-09-27 DIAGNOSIS — I5040 Unspecified combined systolic (congestive) and diastolic (congestive) heart failure: Secondary | ICD-10-CM | POA: Diagnosis not present

## 2020-09-27 DIAGNOSIS — R059 Cough, unspecified: Secondary | ICD-10-CM | POA: Insufficient documentation

## 2020-09-27 DIAGNOSIS — G8929 Other chronic pain: Secondary | ICD-10-CM | POA: Insufficient documentation

## 2020-09-27 DIAGNOSIS — N183 Chronic kidney disease, stage 3 unspecified: Secondary | ICD-10-CM | POA: Insufficient documentation

## 2020-09-27 DIAGNOSIS — Z8249 Family history of ischemic heart disease and other diseases of the circulatory system: Secondary | ICD-10-CM | POA: Insufficient documentation

## 2020-09-27 DIAGNOSIS — Z87891 Personal history of nicotine dependence: Secondary | ICD-10-CM | POA: Diagnosis not present

## 2020-09-27 DIAGNOSIS — R531 Weakness: Secondary | ICD-10-CM | POA: Diagnosis not present

## 2020-09-27 DIAGNOSIS — R5383 Other fatigue: Secondary | ICD-10-CM | POA: Insufficient documentation

## 2020-09-27 DIAGNOSIS — G4733 Obstructive sleep apnea (adult) (pediatric): Secondary | ICD-10-CM | POA: Diagnosis not present

## 2020-09-27 DIAGNOSIS — R0602 Shortness of breath: Secondary | ICD-10-CM | POA: Diagnosis not present

## 2020-09-27 DIAGNOSIS — I130 Hypertensive heart and chronic kidney disease with heart failure and stage 1 through stage 4 chronic kidney disease, or unspecified chronic kidney disease: Secondary | ICD-10-CM | POA: Diagnosis not present

## 2020-09-27 DIAGNOSIS — I10 Essential (primary) hypertension: Secondary | ICD-10-CM

## 2020-09-27 MED ORDER — SACUBITRIL-VALSARTAN 24-26 MG PO TABS: 1.0000 | ORAL_TABLET | Freq: Two times a day (BID) | ORAL | 3 refills | Status: DC

## 2020-09-27 NOTE — Patient Instructions (Signed)
Continue weighing daily and call for an overnight weight gain of > 2 pounds or a weekly weight gain of >5 pounds. 

## 2020-09-27 NOTE — Progress Notes (Signed)
[name], male    DOB: Oct 25, 1965, 55 y.o.   MRN: [id number]  HPI  Hector [name] is a 55 y/o male with a history of MI, hyperlipidemia, HTN, CKD, atrial fibrillation, alcohol use, cardiomyopathy, and CHF.   Echo report from 09/13/20 reviewed showed an EF of 25-30% along with moderate Hector. Echo done 12/03/16 showed an EF of 45-50% which is an improvement from previous echo which was done on 06/02/16 and showed an EF of 30-35%. Prior echo on 02/21/15 which showed a normalization of his EF which was 50-55% with mild Hector. No longer ICD candidate. Prior echo on 03/07/14 showed an EF of 20-25% with mild/mod Hector.   Last left heart cath was done 01/09/15.   Admitted 09/12/20 due to chest pain and shortness of breath. Initially given IV lasix with transition to oral diuretics. Cardiology consult obtained. HTN meds adjusted. Elevated troponin thought to be due to demand ischemia. Discharged after 4 days.   Hector [name] presents today for a follow-up visit with a chief complaint of minimal shortness of breath upon moderate exertion. Hector [name] describes this as chronic in nature having been present for several years. Hector [name] has associated fatigue, cough, left arm numbness (due to pinched nerve), left weakness & chronic pain along with this. Hector [name] denies any difficulty sleeping, abdominal distention, palpitations, pedal edema, chest pain, dizziness or weight gain.   Reviewed med list that Hector [name] brought and Hector [name] says that Hector [name] has to pick up 1 more prescription from [facility] but Hector [name] can't remember which one it is. Seeing a specialist at [facility] on 10/01/20 regarding his pinched nerve and the pain/ numbness in his left arm/ hand.   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 12/2007 MV: EF 28%, no isch;  b. 8/12 Echo: EF 25-35%; c. 02/2014 Echo: EF 20-25%; d. 12/2014 Cath: minimal CAD; e. 01/2015 Echo: EF 50-55%;  d. 05/2016 Echo: EF 30-35%, diff HK, gr2 DD; e.  11/2016 Echo: EF 45-50%, diff HK.   Chronic combined systolic (congestive) and diastolic (congestive) heart failure ([place])    a. 05/2016 Most recent Echo: EF 30-35%, diff HK, Gr2 DD, mild Hector, mod dil LA; b. 11/2016 Echo: EF 45-50%, diff HK.   CKD (chronic kidney disease), stage III (HCC)    Elevated troponin    Chronically elevated. - level was 0.17-0.10 during recent admission   Essential hypertension    GI bleed 11/2013   Hyperlipidemia    Pancreatitis    Paroxysmal A-fib (HCC)    a. new onset s/p unsuccessful TEE/DCCV on 08/16/2014; b. on amio/eliquis (CHA2DS2VASc = 2-3); c. reports 1-2 hrs of afib ~ q63mo.   [place]ox[place] flutter [place]    a. new onset 07/2014; b. s/p unsuccessful TEE/DCCV 08/16/2014; c. on apixaban.   Sleep-disordered breathing    Has yet to have a sleep study   Stroke ([facility]    Past Surgical History:  Procedure Laterality Date   CARDIAC CATHETERIZATION N/A 01/09/2015   Procedure: Left Heart Cath and Coronary Angiography;  Surgeon: DLeonie Man MD;  [facility];  Service: Cardiovascular;  Laterality: N/A;   COLONOSCOPY N/A 07/22/2020   Procedure: COLONOSCOPY;  Surgeon: Toledo, TBenay Pike MD;  Location: [facility] ENDOSCOPY;  Service: Gastroenterology;  Laterality: N/A;   ELECTROPHYSIOLOGIC STUDY N/A 08/16/2014   Procedure: CARDIOVERSION;  Surgeon: TMinna Merritts MD;  Location: [facility] ORS;  Service: Cardiovascular;  Laterality:  N/A;   FLEXIBLE SIGMOIDOSCOPY N/A 10/10/2015   Procedure: FLEXIBLE SIGMOIDOSCOPY;  Surgeon: [name], MD;  Location: [facility]OSCOPY;  Service: Endoscopy;  Laterality: N/A;   KNEE SURGERY Right    NM MYOVIEW LTD  November 2011   No ischemia or infarction. EF 50-55% ( no improvement from 2009 Myoview EF of 28%   TEE WITHOUT CARDIOVERSION N/A 08/16/2014   Procedure: TRANSESOPHAGEAL ECHOCARDIOGRAM (TEE);  Surgeon: [name], MD;  Location: [facility] ORS;  Service: Cardiovascular;  Laterality: N/A;   Family History   Problem Relation Age of Onset   Hypertension Mother    Hyperlipidemia Mother    Diabetes Mother    Social History   Tobacco Use   Smoking status: Former    Packs/day: 1.00    Years: 12.00    Pack years: 12.00    Types: Cigarettes    Quit date: 02/23/1998    Years since quitting: 22.6   Smokeless tobacco: Never  Substance Use Topics   Alcohol use: Not Currently    Alcohol/week: 0.0 standard drinks    Comment: Past heavy drinker   Allergies  Allergen Reactions   Esomeprazole Magnesium Other (See Comments)    Suspected interstitial nephritis 2018   Eggs Or Egg-Derived Products Rash   Prior to Admission medications   Medication Sig Start Date End Date Taking? Authorizing Provider  apixaban (ELIQUIS) 5 MG TABS tablet Take 1 tablet (5 mg total) by mouth 2 (two) times daily. 09/16/20 10/16/20 Yes Nolberto Hanlon, MD  atorvastatin (LIPITOR) 80 MG tablet Take 1 tablet (80 mg total) by mouth daily. 03/15/18  Yes Darylene Price A, FNP  carvedilol (COREG) 6.25 MG tablet Take 1 tablet (6.25 mg total) by mouth 2 (two) times daily with a meal. 09/16/20 10/16/20 Yes Amery, Gwynneth Albright, MD  folic acid (FOLVITE) 1 MG tablet Take 1 tablet (1 mg total) by mouth daily. 09/16/20 10/16/20 Yes Nolberto Hanlon, MD  furosemide (LASIX) 40 MG tablet Take 1 tablet (40 mg total) by mouth daily. 09/16/20 10/16/20 Yes Nolberto Hanlon, MD  hydrALAZINE (APRESOLINE) 25 MG tablet Take 1 tablet (25 mg total) by mouth 3 (three) times daily. 09/16/20 10/16/20 Yes Nolberto Hanlon, MD  levothyroxine (SYNTHROID) 25 MCG tablet Take 25 mcg by mouth daily before breakfast.   Yes [provider]  ferrous gluconate (FERGON) 324 MG tablet Take 324 mg by mouth daily with breakfast. Patient not taking: Reported on 09/27/2020    [provider]  gabapentin (NEURONTIN) 100 MG capsule Take 2 capsules (200 mg total) by mouth 3 (three) times daily. 06/20/20 08/19/20  Menshew, Dannielle Karvonen, PA-C  Multiple Vitamin (MULTIVITAMIN WITH MINERALS) TABS  tablet Take 1 tablet by mouth daily. 09/16/20 10/16/20  Nolberto Hanlon, MD  thiamine 100 MG tablet Take 1 tablet (100 mg total) by mouth daily. 09/16/20 10/16/20  Nolberto Hanlon, MD  vitamin B-12 (CYANOCOBALAMIN) 500 MCG tablet Take 500 mcg by mouth daily.    [provider]    Review of Systems  Constitutional:  Positive for fatigue. Negative for appetite change.  HENT:  Negative for congestion, postnasal drip and sore throat.   Eyes: Negative.   Respiratory:  Positive for cough ("little bit") and shortness of breath (minimal).   Cardiovascular:  Negative for chest pain, palpitations and leg swelling.  Gastrointestinal:  Negative for abdominal distention and abdominal pain.  Endocrine: Negative.   Genitourinary: Negative.   Musculoskeletal:  Positive for arthralgias (left arm) and back pain. Negative for neck pain.  Skin:  Negative for color change.  Allergic/Immunologic: Negative.   Neurological:  Positive for weakness (legs and left hand) and numbness (down left arm (pinched nerve)). Negative for dizziness and light-headedness.  Hematological:  Negative for adenopathy. Does not bruise/bleed easily.  Psychiatric/Behavioral:  Negative for dysphoric mood and sleep disturbance (CPAP at home). The patient is not nervous/anxious.    Vitals:   09/27/20 1241  BP: (!) 138/100  Pulse: 77  Resp: 18  SpO2: 97%  Weight: 238 lb 6 oz (108.1 kg)  Height: '5\' 11"'$  (1.803 m)   Wt Readings from Last 3 Encounters:  09/27/20 238 lb 6 oz (108.1 kg)  09/16/20 234 lb 9.6 oz (106.4 kg)  07/22/20 253 lb 15.5 oz (115.2 kg)   Lab Results  Component Value Date   CREATININE 2.41 (H) 09/16/2020   CREATININE 2.84 (H) 09/15/2020   CREATININE 2.65 (H) 09/14/2020    Physical Exam Vitals reviewed.  Constitutional:      Appearance: Normal appearance.  HENT:     Head: Normocephalic and atraumatic.  Cardiovascular:     Rate and Rhythm: Normal rate and regular rhythm.  Pulmonary:     Effort: Pulmonary  effort is normal. No respiratory distress.     Breath sounds: No wheezing or rales.  Abdominal:     General: There is no distension.     Palpations: Abdomen is soft.  Musculoskeletal:     Cervical back: Normal range of motion and neck supple.     Right lower leg: No edema.     Left lower leg: No edema.  Skin:    General: Skin is warm and dry.  Neurological:     General: No focal deficit present.     Mental Status: Hector [name] is alert and oriented to person, place, and time.     Motor: Weakness present.  Psychiatric:        Mood and Affect: Mood normal.        Behavior: Behavior normal.        Thought Content: Thought content normal.     Assessment & Plan:  1: Chronic heart failure with reduced ejection fraction- - NYHA Class II - euvolemic today - weighing daily; reminded to call for an overnight weight gain of >2 pounds or a weekly weight gain of >5 pounds - weight down 35 pounds from last visit here 9 months ago - on GDMT of carvedilol - will add entresto 24/'26mg'$  BID but will need to monitor renal function closely; 2 weeks samples provided - order written for him to get BMP drawn on 10/01/20 when Hector [name] goes to specialist appointment - not adding salt; reports decreased appetite because of the taste of the food - saw cardiology Rockey Situ) 04/05/20; returns 10/08/20 - BNP 09/12/20 was 3375.0  2: HTN- - BP elevated even when rechecked; entresto added per above; should renal function worsen, may need to add nitrates - saw PCP at [facility] last week; returns ~ 2 months - BMP from 09/16/20 reviewed and showed sodium 135, potassium 3.7, creatinine 2.41 and GFR 31 - saw nephrology Ardyth Man) 02/10/2019; needs to schedule f/u  3: Obstructive sleep apnea- - sleeping well at night  - says that Hector [name] needs new CPAP machine; advised him to call the company but explained that, unfortunately, the machines seem to be backlogged   Medication list reviewed although patient is unsure  of what or how Hector [name]'s taking them and doesn't remember which one Hector [name] has to pick up yet.  Return in 3 weeks or sooner for any questions/problems before then.

## 2020-10-08 ENCOUNTER — Other Ambulatory Visit: Payer: Self-pay

## 2020-10-08 ENCOUNTER — Encounter: Payer: Self-pay | Admitting: Physician Assistant

## 2020-10-08 ENCOUNTER — Telehealth: Payer: Self-pay | Admitting: *Deleted

## 2020-10-08 ENCOUNTER — Ambulatory Visit (INDEPENDENT_AMBULATORY_CARE_PROVIDER_SITE_OTHER): Payer: Medicaid Other | Admitting: Physician Assistant

## 2020-10-08 VITALS — BP 130/100 | HR 56 | Ht 71.0 in | Wt 239.0 lb

## 2020-10-08 DIAGNOSIS — E1122 Type 2 diabetes mellitus with diabetic chronic kidney disease: Secondary | ICD-10-CM

## 2020-10-08 DIAGNOSIS — I480 Paroxysmal atrial fibrillation: Secondary | ICD-10-CM

## 2020-10-08 DIAGNOSIS — G4733 Obstructive sleep apnea (adult) (pediatric): Secondary | ICD-10-CM | POA: Diagnosis not present

## 2020-10-08 DIAGNOSIS — E785 Hyperlipidemia, unspecified: Secondary | ICD-10-CM

## 2020-10-08 DIAGNOSIS — I428 Other cardiomyopathies: Secondary | ICD-10-CM

## 2020-10-08 DIAGNOSIS — I5021 Acute systolic (congestive) heart failure: Secondary | ICD-10-CM | POA: Diagnosis not present

## 2020-10-08 DIAGNOSIS — I10 Essential (primary) hypertension: Secondary | ICD-10-CM

## 2020-10-08 DIAGNOSIS — N189 Chronic kidney disease, unspecified: Secondary | ICD-10-CM

## 2020-10-08 DIAGNOSIS — I5020 Unspecified systolic (congestive) heart failure: Secondary | ICD-10-CM

## 2020-10-08 DIAGNOSIS — N179 Acute kidney failure, unspecified: Secondary | ICD-10-CM

## 2020-10-08 MED ORDER — FUROSEMIDE 40 MG PO TABS: 40.0000 mg | ORAL_TABLET | Freq: Every day | ORAL | 5 refills | Status: DC

## 2020-10-08 MED ORDER — HYDRALAZINE HCL 25 MG PO TABS: 25.0000 mg | ORAL_TABLET | Freq: Three times a day (TID) | ORAL | 5 refills | Status: DC

## 2020-10-08 MED ORDER — APIXABAN 5 MG PO TABS: 5.0000 mg | ORAL_TABLET | Freq: Two times a day (BID) | ORAL | 5 refills | Status: DC

## 2020-10-08 MED ORDER — CARVEDILOL 6.25 MG PO TABS: 6.2500 mg | ORAL_TABLET | Freq: Two times a day (BID) | ORAL | 5 refills | Status: DC

## 2020-10-08 MED ORDER — ATORVASTATIN CALCIUM 80 MG PO TABS: 80.0000 mg | ORAL_TABLET | Freq: Every day | ORAL | 5 refills | Status: DC

## 2020-10-08 MED ORDER — SACUBITRIL-VALSARTAN 24-26 MG PO TABS: 1.0000 | ORAL_TABLET | Freq: Two times a day (BID) | ORAL | 5 refills | Status: DC

## 2020-10-08 NOTE — Patient Instructions (Signed)
Medication Instructions:   Your physician has recommended you make the following change in your medication:   INCREASE Furosemide 40 mg - take TWO tablets (80 mg) daily x 2 days, THEN resume Furosemide 40 mg daily  *If you need a refill on your cardiac medications before your next appointment, please call your pharmacy*   Lab Work:  TODAY: BNP, TSH, Free T4, CMET, CBC  If you have labs (blood work) drawn today and your tests are completely normal, you will receive your results only by: [facility] (if you have MyChart) OR A paper copy in the mail If you have any lab test that is abnormal or we need to change your treatment, we will call you to review the results.   Testing/Procedures:  Your physician has requested that you have an echocardiogram. Echocardiography is a painless test that uses sound waves to create images of your heart. It provides your doctor with information about the size and shape of your heart and how well your heart's chambers and valves are working. This procedure takes approximately one hour. There are no restrictions for this procedure.   Follow-Up: At [facility], you and your health needs are our priority.  As part of our continuing mission to provide you with exceptional heart care, we have created designated Provider Care Teams.  These Care Teams include your primary Cardiologist (physician) and Advanced Practice Providers (APPs -  Physician Assistants and Nurse Practitioners) who all work together to provide you with the care you need, when you need it.  We recommend signing up for the patient portal called "MyChart".  Sign up information is provided on this After Visit Summary.  MyChart is used to connect with patients for Virtual Visits (Telemedicine).  Patients are able to view lab/test results, encounter notes, upcoming appointments, etc.  Non-urgent messages can be sent to your provider as well.   To learn more about what you can do with MyChart,  go to [email or web address].    Your next appointment:   1 - 2 month(s)  The format for your next appointment:   In Person  Provider:   You may see Ida Rogue, MD or one of the following Advanced Practice Providers on your designated Care Team:   Murray Hodgkins, NP Christell Faith, PA-C Marrianne Mood, PA-C Cadence Crenshaw, [place]

## 2020-10-08 NOTE — Progress Notes (Signed)
Office Visit    Patient Name: Hector [name] Date of Encounter: 10/09/2020  PCP:  Hector [name], Latta  Cardiologist:  Hector Rogue, [name]  Advanced Practice Provider:  No care team member to display Electrophysiologist:  None [phone]   Chief Complaint    Chief Complaint  Patient presents with   Hospitalization Follow-up    55 y.o. male with history of chronic combined CHF/nonischemic cardiomyopathy likely secondary to alcohol abuse, hypertension, hyperlipidemia, paroxysmal atrial fibrillation, DM2, hypothyroidism, OSA on CPAP, CKD stage III, COVID-19 infection 11/2019 requiring hospitalization, and is seen today for follow-up.  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 12/2007 MV: EF 28%, no isch;  b. 8/12 Echo: EF 25-35%; c. 02/2014 Echo: EF 20-25%; d. 12/2014 Cath: minimal CAD; e. 01/2015 Echo: EF 50-55%;  d. 05/2016 Echo: EF 30-35%, diff HK, gr2 DD; e. 11/2016 Echo: EF 45-50%, diff HK.   Chronic combined systolic (congestive) and diastolic (congestive) heart failure ([place])    a. 05/2016 Most recent Echo: EF 30-35%, diff HK, Gr2 DD, mild MR, mod dil LA; b. 11/2016 Echo: EF 45-50%, diff HK.   CKD (chronic kidney disease), stage III (HCC)    Elevated troponin    Chronically elevated. - level was 0.17-0.10 during recent admission   Essential hypertension    GI bleed 11/2013   Hyperlipidemia    Pancreatitis    Paroxysmal A-fib (HCC)    a. new onset s/p unsuccessful TEE/DCCV on 08/16/2014; b. on amio/eliquis (CHA2DS2VASc = 2-3); c. reports 1-2 hrs of afib ~ q82mo.   Paroxysmal atrial flutter ([place]    a. new onset 07/2014; b. s/p unsuccessful TEE/DCCV 08/16/2014; c. on apixaban.   Sleep-disordered breathing    Has yet to have a sleep study   Stroke ([facility]    Past Surgical History:  Procedure Laterality Date   CARDIAC CATHETERIZATION N/A 01/09/2015    Procedure: Left Heart Cath and Coronary Angiography;  Surgeon: Hector Man [name];  [facility];  Service: Cardiovascular;  Laterality: N/A;   COLONOSCOPY N/A 07/22/2020   Procedure: COLONOSCOPY;  Surgeon: Hector [name];  Location: [facility] ENDOSCOPY;  Service: Gastroenterology;  Laterality: N/A;   ELECTROPHYSIOLOGIC STUDY N/A 08/16/2014   Procedure: CARDIOVERSION;  Surgeon: Hector [name];  Location: [facility] ORS;  Service: Cardiovascular;  Laterality: N/A;   FLEXIBLE SIGMOIDOSCOPY N/A 10/10/2015   Procedure: FLEXIBLE SIGMOIDOSCOPY;  Surgeon: Hector [name];  [facility];  Service: Endoscopy;  Laterality: N/A;   KNEE SURGERY Right    NM MYOVIEW LTD  November 2011   No ischemia or infarction. EF 50-55% ( no improvement from 2009 Myoview EF of 28%   TEE WITHOUT CARDIOVERSION N/A 08/16/2014   Procedure: TRANSESOPHAGEAL ECHOCARDIOGRAM (TEE);  Surgeon: Hector [name];  Location: [facility] ORS;  Service: Cardiovascular;  Laterality: N/A;    Allergies  Allergies  Allergen Reactions   Esomeprazole Magnesium Other (See Comments)    Suspected interstitial nephritis 2018   Eggs Or Egg-Derived Products Rash    History of Present Illness    Hector MAULDENis a 55y.o. male with PMH as above.   He was admitted 12/13/2019 to 01/13/2020 for COVID-19.  [facility]  It was noted he was a poor historian.  He has had low EF down to  20% in 2009.  EF 40 to 45% 2018.  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  2021 EF 45 to 50%.  He was [facility] for [facility] with progressive shortness of breath and lower extremity edema in setting of medication noncompliance and concern for EtOH use versus HTN CM.  He reported inability to walk very far due to shortness of breath, severe since COVID-19.  He had not been taking his medications since March 2022.  He reported trouble getting his medications at rehab.  In the emergency  department, HR 60s to 70s, creatinine 2.53, BUN 29, hemoglobin 9.3, WBC 9.2, high-sensitivity troponin 54 and 43, BNP 3375.  COVID-19 negative. Chest X-ray showed stable vascular congestion.  He received IV Lasix.  Echo showed EF 25 to 30% with global hypokinesis.  GDMT was recommended with follow-up echo in 1 month.  ACE/ARB/spironolactone/Entresto not recommended given his renal function.  It was noted he would benefit from long-acting nitrates and ischemic work-up could be considered at a later date.  Catheterization was avoided due to renal function.  Follow-up stressed with nephrology.  Seen by the heart failure clinic 8/5 with shortness of breath on exertion.  He reported fatigue, cough, left arm numbness, left arm weakness and chronic pain.  BP 138/100. Wt 238lbs. No changes in lasix. Entresto 24/26 added with recent creatinine 2.41.  Today, 10/08/2020, he returns to clinic and reports ongoing shortness of breath.  No chest pain.  He reports decreased appetite, though he thinks this is due to the taste of food.  He was initially scheduled with nephrology, though his appointment needed rescheduled and he has yet to schedule follow-up.  He has not been using his CPAP and needs a new machine with request for clarification on steps to do so.  He has not been taking his hydralazine and his unclear which medications he should be taking today.  He was under the impression that his hydralazine was discontinued, which was clarified as not the case.  He brings paperwork with him, which includes all of his medications as printed scripts that have not yet been filled at [facility].  He states he is taking Eliquis, however.  He reports monitoring his BP at home but is unable to recall these values.  No weights.  He states his believe that his shortness of breath is due to his left arm issue/pinched nerve.  He reports drinking 3 bottles of 12 ounce water per day.  He is also drinking soda.  He denies any alcohol, illicit  drugs, or tobacco use.  No signs or symptoms of bleeding.  Home Medications   Current Outpatient Medications  Medication Instructions   apixaban (ELIQUIS) 5 mg, Oral, 2 times daily   atorvastatin (LIPITOR) 80 mg, Oral, Daily   carvedilol (COREG) 6.25 mg, Oral, 2 times daily with meals   ferrous gluconate (FERGON) 324 mg, Oral, Daily with breakfast   folic acid (FOLVITE) 1 mg, Oral, Daily   furosemide (LASIX) 40 mg, Oral, Daily   hydrALAZINE (APRESOLINE) 25 mg, Oral, 3 times daily   levothyroxine (SYNTHROID) 25 mcg, Daily before breakfast     Review of Systems    He denies chest pain, palpitations, pnd, orthopnea, n, v, dizziness, syncope, edema, weight gain.  He reports shortness of breath/dyspnea and reduced appetite.  He attributes his reduced appetite due to loss of taste.   All other systems reviewed and are otherwise negative except as noted above.  Physical Exam    VS:  BP Marland Kitchen)  130/100 (BP Location: Left Arm, Patient Position: Sitting, Cuff Size: Large)   Pulse (!) 56   Ht '5\' 11"'  (1.803 m)   Wt 239 lb (108.4 kg)   SpO2 98%   BMI 33.33 kg/m  , BMI Body mass index is 33.33 kg/m. GEN: Well nourished, well developed, in no acute distress. HEENT: normal. Neck: Supple, + JVD.  No carotid bruits, or masses. Cardiac: Bradycardic but regular, no murmurs, rubs, or gallops. No clubbing, cyanosis, edema.  Radials/DP/PT 2+ and equal bilaterally.  Respiratory:  Bilateral crackles appreciated and at times work of breathing. GI: nontender, ++distended, firm, BS + x 4. MS: no deformity or atrophy. Skin: warm and dry, no rash. Neuro:  Strength and sensation are intact. Psych: Normal affect.  Accessory Clinical Findings    ECG personally reviewed by me today -  sinus bradycardia, 56 bpm, prolonged PR interval at 190 ms, QTC 461, incomplete left bundle branch block, nonspecific T wave abnormality/prolonged QT - no acute changes.  VITALS Reviewed today   Temp Readings from Last 3  Encounters:  09/16/20 98.3 F (36.8 C)  08/27/20 98.2 F (36.8 C)  07/25/20 (!) 97.3 F (36.3 C) (Oral)   BP Readings from Last 3 Encounters:  10/08/20 (!) 130/100  09/27/20 (!) 138/100  09/16/20 116/80   Pulse Readings from Last 3 Encounters:  10/08/20 (!) 56  09/27/20 77  09/16/20 74    Wt Readings from Last 3 Encounters:  10/08/20 239 lb (108.4 kg)  09/27/20 238 lb 6 oz (108.1 kg)  09/16/20 234 lb 9.6 oz (106.4 kg)     LABS  reviewed today   Lab Results  Component Value Date   WBC 9.7 10/08/2020   HGB 11.2 (L) 10/08/2020   HCT 37.9 10/08/2020   MCV 77 (L) 10/08/2020   PLT 351 10/08/2020   Lab Results  Component Value Date   CREATININE 2.32 (H) 10/08/2020   BUN 33 (H) 10/08/2020   NA 142 10/08/2020   K 5.6 (H) 10/08/2020   CL 106 10/08/2020   CO2 WILL FOLLOW 10/08/2020   Lab Results  Component Value Date   ALT 8 10/08/2020   AST 18 10/08/2020   ALKPHOS 92 10/08/2020   BILITOT 0.6 10/08/2020   Lab Results  Component Value Date   CHOL 89 02/03/2020   HDL 15 (L) 02/03/2020   LDLCALC 49 02/03/2020   TRIG 125 02/03/2020   CHOLHDL 5.9 02/03/2020    Lab Results  Component Value Date   HGBA1C 7.7 (H) 12/23/2019   Lab Results  Component Value Date   TSH 15.900 (H) 10/08/2020     STUDIES/PROCEDURES reviewed today  Echo 09/13/2020 Left ventricular ejection fraction, by estimation, is 25 to 30%. The  left ventricle has severely decreased function. The left ventricle  demonstrates global hypokinesis. The left ventricular internal cavity size  was mildly dilated. Left ventricular  diastolic parameters are indeterminate. The average left ventricular  global longitudinal strain is -6.7 %. The global longitudinal strain is  abnormal.   2. Right ventricular systolic function is moderately reduced. The right  ventricular size is normal. Tricuspid regurgitation signal is inadequate  for assessing PA pressure.   3. The mitral valve is normal in structure.  Moderate mitral valve  regurgitation.   4. The inferior vena cava is dilated in size with <50% respiratory  variability, suggesting right atrial pressure of 15 mmHg.     LHC 2016 Angiographically minimal CAD Non-ishcemic Cardiomyopathy Systemic Hypertension with Mild-Moderately increase LVEDP  after pre-cath hydration. This confirms the presumed diagnosis of Non-ischemic Cardiomyopathy.  Assessment & Plan    Acute on chronic combined CHF/NICM --Reports ongoing shortness of breath that he attributes to a pinched nerve and early satiety that he attributes to loss of taste.  We discussed symptoms of heart failure in depth today.  CHF education greatly needed.  He denies any recent alcohol, drug, or tobacco use.  Recently admitted with severely reduced LVEF 25 to 30%, global hypokinesis, moderately reduced RV SF, moderate MR.  He was significantly volume overloaded and IV diuresed.  Further work-up of his reduced EF deferred in the setting of his kidney function with previous 2016 cath as above.  At the heart failure clinic, BP significantly elevated with weight up -he was started on Entresto and Lasix dose continued.  Today, he remains volume overloaded with BP still significantly elevated at 130/100.  Weight still increased from that of his discharge weight at 108.4 kg (discharge weight 106.4 kg).  He is unclear which medications he should be taking at home. Increased to furosemide 40 mg x 3 days then furosemide 40 mg daily thereafter.   Check c-Met, BNP. Discussed salt and fluid intake. Recommended daily weights, BP check.  Will provide with scale. Continue carvedilol if currently taking his BB, otherwise hold due to bradycardia.  Asx with SB today.   Check TSH, given he reports that he has not taken his Synthroid and given bradycardic rate. Confirm if taking BB given bradycardia today.  He reports he is taking this medication. Discontinued Entresto.   Do not recommend  ACE/ARB/spironolactone/Entresto given his renal function. Check CMET. Follow-up with nephrology stressed.  Continue hydralazine for BP support. Will order outpatient repeat echo.  Ideally, we will obtain his outpatient echo after a few months of GDMT. Medication compliance stressed.  Refills for medications provided. Cath deferred given Cr with 2016 cath showing nl cors and until follow-up with nephrology. Consider addition of nitrates at RTC.  Will defer for now, given restarting most medications today. Ongoing alcohol cessation recommended.  Lifestyle changes discussed.  Paroxysmal atrial fibrillation/flutter --Asymptomatic.  Maintaining sinus/sinus bradycardia today.  Asymptomatic with bradycardic rate.  Will recheck TSH, free T4 given his bradycardia.  Continue current Coreg.  CHA2DS2-VASc score of at least 4 (CHF, hypertension, DM2, PAD).  Continue anticoagulation with Eliquis 5 mg twice daily.  He reports taking this medication.  No reported signs or symptoms of bleeding.  Compliance stressed to prevent thromboembolic event.  Will check c-Met, CBC.  Essential hypertension --BP poorly controlled in the setting of medication noncompliance and volume overload.  Hydralazine 25 mg administered in clinic with some improvement in BP.  Continue current furosemide as directed above.  Restart hydralazine with possible increase dose at RTC if BP still uncontrolled.  Continue beta-blocker.  Discontinue Entresto, given his renal function.  Do not recommend ACE/ARB/spironolactone/Entresto given his renal function.  Reviewed salt and fluid restrictions.  Recommend monitor BP at home with goal BP 130/80 or lower.  Hypothyroidism --We will recheck TSH, free T4.  He reports that he has not been taking his Synthroid with bradycardic rate today.  HLD --LDL 174 back in 2020.  Started on atorvastatin 80 mg daily.  Recheck lipids at RTC to ensure at goal below 70.  CKD stage III --Nephrology referral placed at  time of discharge.  He will follow-up with nephrology as discussed today.  Check c-Met. Discontinue Entresto, given his renal function.  Do not recommend ACE/ARB/spironolactone/Entresto given his renal  function.  As above, catheterization deferred during admission in the setting of his renal function.  OSA, not on a CPAP --Referral to pulmonology for discussion of how to obtain a new CPAP.  He reports he needs a new CPAP.    Medication changes: Restart /confirm medications.  Discontinue Entresto, given his renal function.  Confirm if he has been taking his beta-blocker, given bradycardic rate.  Discontinue beta-blocker if not taking at this time, given bradycardic rate.  Increased to Lasix 40 mg twice daily x3 days then drop down to Lasix 40 mg daily. Labs ordered: BNP, TSH, free T4, c-Met, CBC Studies / Imaging ordered: Echo, pulmonology referral, nephrology referral already made Future considerations: Pending confirmation of current medications/labs/echo, as well as follow-up with nephrology.?   Consider addition of nitrates at RTC.  Recheck lipids, liver function. Disposition: RTC 1 month, sooner if needed  *Please be aware that the above documentation was completed voice recognition software and may contain dictation errors.    Arvil Chaco, PA-C 10/09/2020

## 2020-10-08 NOTE — Telephone Encounter (Signed)
Prescription refill request for Eliquis received. Indication: PAF Last office visit: 10/08/20 Elenor Quinones PA-C Scr:2.84 on 09/15/20 Age:  55 Weight: 108.4kg  Based on above findings Eliquis '5mg'$  twice daily is the appropriate dose.  Refill approved.

## 2020-10-08 NOTE — Telephone Encounter (Signed)
Please send refill for pt's Eliquis for 30 day supply to [facility]. Pt seen in clinic today.

## 2020-10-09 LAB — BRAIN NATRIURETIC PEPTIDE: BNP: 561.2 pg/mL — ABNORMAL HIGH (ref 0.0–100.0)

## 2020-10-09 MED ORDER — HYDRALAZINE HCL 10 MG PO TABS
25.0000 mg | ORAL_TABLET | Freq: Once | ORAL | Status: AC
Start: 2020-10-09 — End: 2020-10-09
  Administered 2020-10-09: 25 mg via ORAL

## 2020-10-10 ENCOUNTER — Telehealth: Payer: Self-pay | Admitting: *Deleted

## 2020-10-10 NOTE — Telephone Encounter (Signed)
Attempted to call pt. LDM stating to please call our office back today to discuss abnormal lab results and provider's recc. (DPR approved to LDM)

## 2020-10-10 NOTE — Telephone Encounter (Signed)
-----   Message from Arvil Chaco, PA-C sent at 10/09/2020  4:44 PM EDT ----- Fluid lab elevated. As discussed in clinic, he is volume up. Recommend increased diuresis as discussed in clinic with lasix '40mg'$  BID and for at least 3 days. Monitor wt and BP at home. Call the office if wt and BP do not decrease.  Potassium high. Sometimes this is due to lab error. Recommend STAT repeat of BMET to confirm.   Discontinue Entresto given kidney function - this was started after admission by the heart failure clinic and is contraindicated by his kidney function.  Blood counts stable.  Thyroid labs very abnormal. Follow-up with PCP.

## 2020-10-11 NOTE — Telephone Encounter (Signed)
Spoke with patient and reviewed results and recommendations. Instructed him to go over to [facility] entrance for repeat labs to be done. He states he has already been doing the twice a day furosemide with instructions to go back to once a day once he has done those 3 days. Advised he should stop taking the entresto due to kidney function and to follow up with his primary care provided for his abnormal thyroid. Requested that he please monitor his blood pressures and weights and to call us if those do not decrease. He verbalized understanding of all instructions, repeated back, and had no further questions at this time.

## 2020-10-14 ENCOUNTER — Telehealth: Payer: Self-pay | Admitting: *Deleted

## 2020-10-14 DIAGNOSIS — E875 Hyperkalemia: Secondary | ICD-10-CM

## 2020-10-14 NOTE — Telephone Encounter (Signed)
-----   Message from Arvil Chaco, PA-C sent at 10/09/2020  4:44 PM EDT ----- Fluid lab elevated. As discussed in clinic, he is volume up. Recommend increased diuresis as discussed in clinic with lasix '40mg'$  BID and for at least 3 days. Monitor wt and BP at home. Call the office if wt and BP do not decrease.  Potassium high. Sometimes this is due to lab error. Recommend STAT repeat of BMET to confirm.   Discontinue Entresto given kidney function - this was started after admission by the heart failure clinic and is contraindicated by his kidney function.  Blood counts stable.  Thyroid labs very abnormal. Follow-up with PCP.

## 2020-10-14 NOTE — Telephone Encounter (Signed)
Spoke to pt (see previous phone note, unable to reach pt with results last week). Notified of lab results and provider's recc.  Pt states he can repeat BMET tomorrow at the medical mall at [facility].  Pt will stop Entresto d/t renal fxn.  Pt has no further questions at this time.

## 2020-10-15 ENCOUNTER — Other Ambulatory Visit
Admission: RE | Admit: 2020-10-15 | Discharge: 2020-10-15 | Disposition: A | Discharge status: Home / Self Care | Payer: Medicaid Other | Attending: Family | Admitting: Family

## 2020-10-15 DIAGNOSIS — E875 Hyperkalemia: Secondary | ICD-10-CM | POA: Insufficient documentation

## 2020-10-15 LAB — BASIC METABOLIC PANEL
Anion gap: 8 (ref 5–15)
BUN: 34 mg/dL — ABNORMAL HIGH (ref 6–20)
CO2: 19 mmol/L — ABNORMAL LOW (ref 22–32)
Calcium: 8.8 mg/dL — ABNORMAL LOW (ref 8.9–10.3)
Chloride: 112 mmol/L — ABNORMAL HIGH (ref 98–111)
Creatinine, Ser: 2.57 mg/dL — ABNORMAL HIGH (ref 0.61–1.24)
GFR, Estimated: 28 mL/min — ABNORMAL LOW (ref >60)
Glucose, Bld: 99 mg/dL (ref 70–99)
Potassium: 4.6 mmol/L (ref 3.5–5.1)
Sodium: 139 mmol/L (ref 135–145)

## 2020-10-23 ENCOUNTER — Other Ambulatory Visit: Payer: Self-pay

## 2020-10-23 ENCOUNTER — Ambulatory Visit: Payer: Medicaid Other | Attending: Family | Admitting: Family

## 2020-10-23 ENCOUNTER — Encounter: Payer: Self-pay | Admitting: Family

## 2020-10-23 VITALS — BP 114/80 | HR 64 | Resp 20 | Ht 71.0 in | Wt 239.5 lb

## 2020-10-23 DIAGNOSIS — N183 Chronic kidney disease, stage 3 unspecified: Secondary | ICD-10-CM | POA: Insufficient documentation

## 2020-10-23 DIAGNOSIS — M79602 Pain in left arm: Secondary | ICD-10-CM | POA: Insufficient documentation

## 2020-10-23 DIAGNOSIS — R059 Cough, unspecified: Secondary | ICD-10-CM | POA: Diagnosis not present

## 2020-10-23 DIAGNOSIS — I10 Essential (primary) hypertension: Secondary | ICD-10-CM

## 2020-10-23 DIAGNOSIS — Z8673 Personal history of transient ischemic attack (TIA), and cerebral infarction without residual deficits: Secondary | ICD-10-CM | POA: Insufficient documentation

## 2020-10-23 DIAGNOSIS — I5032 Chronic diastolic (congestive) heart failure: Secondary | ICD-10-CM | POA: Insufficient documentation

## 2020-10-23 DIAGNOSIS — R531 Weakness: Secondary | ICD-10-CM | POA: Diagnosis not present

## 2020-10-23 DIAGNOSIS — Z87891 Personal history of nicotine dependence: Secondary | ICD-10-CM | POA: Diagnosis not present

## 2020-10-23 DIAGNOSIS — R200 Anesthesia of skin: Secondary | ICD-10-CM | POA: Insufficient documentation

## 2020-10-23 DIAGNOSIS — Z8249 Family history of ischemic heart disease and other diseases of the circulatory system: Secondary | ICD-10-CM | POA: Diagnosis not present

## 2020-10-23 DIAGNOSIS — G4733 Obstructive sleep apnea (adult) (pediatric): Secondary | ICD-10-CM | POA: Diagnosis not present

## 2020-10-23 DIAGNOSIS — I130 Hypertensive heart and chronic kidney disease with heart failure and stage 1 through stage 4 chronic kidney disease, or unspecified chronic kidney disease: Secondary | ICD-10-CM | POA: Diagnosis not present

## 2020-10-23 DIAGNOSIS — I5020 Unspecified systolic (congestive) heart failure: Secondary | ICD-10-CM

## 2020-10-23 LAB — CBC
Hematocrit: 37.9 % (ref 37.5–51.0)
Hemoglobin: 11.2 g/dL — ABNORMAL LOW (ref 13.0–17.7)
MCH: 22.6 pg — ABNORMAL LOW (ref 26.6–33.0)
MCHC: 29.6 g/dL — ABNORMAL LOW (ref 31.5–35.7)
MCV: 77 fL — ABNORMAL LOW (ref 79–97)
Platelets: 351 10*3/uL (ref 150–450)
RBC: 4.95 x10E6/uL (ref 4.14–5.80)
RDW: 18.5 % — ABNORMAL HIGH (ref 11.6–15.4)
WBC: 9.7 10*3/uL (ref 3.4–10.8)

## 2020-10-23 LAB — COMPREHENSIVE METABOLIC PANEL
ALT: 8 IU/L (ref 0–44)
AST: 18 IU/L (ref 0–40)
Albumin/Globulin Ratio: 1.9 (ref 1.2–2.2)
Albumin: 4.5 g/dL (ref 3.8–4.9)
Alkaline Phosphatase: 92 IU/L (ref 44–121)
BUN/Creatinine Ratio: 14 (ref 9–20)
BUN: 33 mg/dL — ABNORMAL HIGH (ref 6–24)
Bilirubin Total: 0.6 mg/dL (ref 0.0–1.2)
Calcium: 9.1 mg/dL (ref 8.7–10.2)
Chloride: 106 mmol/L (ref 96–106)
Creatinine, Ser: 2.32 mg/dL — ABNORMAL HIGH (ref 0.76–1.27)
Globulin, Total: 2.4 g/dL (ref 1.5–4.5)
Glucose: 82 mg/dL (ref 65–99)
Potassium: 5.6 mmol/L — ABNORMAL HIGH (ref 3.5–5.2)
Sodium: 142 mmol/L (ref 134–144)
Total Protein: 6.9 g/dL (ref 6.0–8.5)
eGFR: 32 mL/min/{1.73_m2} — ABNORMAL LOW (ref >59)

## 2020-10-23 LAB — T4, FREE: Free T4: 0.98 ng/dL (ref 0.82–1.77)

## 2020-10-23 LAB — TSH: TSH: 15.9 u[IU]/mL — ABNORMAL HIGH (ref 0.450–4.500)

## 2020-10-23 NOTE — Progress Notes (Signed)
[name], male    DOB: 27-Apr-1965, 55 y.o.   MRN: [id number]  HPI  Mr Hector [name] is a 55 y/o male with a history of MI, hyperlipidemia, HTN, CKD, atrial fibrillation, alcohol use, cardiomyopathy, and CHF.   Echo report from 09/13/20 reviewed showed an EF of 25-30% along with moderate MR. Echo done 12/03/16 showed an EF of 45-50% which is an improvement from previous echo which was done on 06/02/16 and showed an EF of 30-35%. Prior echo on 02/21/15 which showed a normalization of his EF which was 50-55% with mild MR. No longer ICD candidate. Prior echo on 03/07/14 showed an EF of 20-25% with mild/mod MR.   Last left heart cath was done 01/09/15.   Admitted 09/12/20 due to chest pain and shortness of breath. Initially given IV lasix with transition to oral diuretics. Cardiology consult obtained. HTN meds adjusted. Elevated troponin thought to be due to demand ischemia. Discharged after 4 days.   He presents today for a follow-up visit with a chief complaint of minimal fatigue upon moderate exertion. He describes this as chronic in nature having been present for several years. He has associated cough, left hand weakness/ pain and left arm numbness along with this. He denies any difficulty sleeping, dizziness, abdominal distention, palpitations, pedal edema, chest pain, shortness of breath or weight gain.   Was tried on entresto with slight improvement of renal function along with elevated potassium. Labs were rechecked a few days later and renal function had declined so entresto was stopped.   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 12/2007 MV: EF 28%, no isch;  b. 8/12 Echo: EF 25-35%; c. 02/2014 Echo: EF 20-25%; d. 12/2014 Cath: minimal CAD; e. 01/2015 Echo: EF 50-55%;  d. 05/2016 Echo: EF 30-35%, diff HK, gr2 DD; e. 11/2016 Echo: EF 45-50%, diff HK.   Chronic combined systolic (congestive) and diastolic (congestive)  heart failure ([place])    a. 05/2016 Most recent Echo: EF 30-35%, diff HK, Gr2 DD, mild MR, mod dil LA; b. 11/2016 Echo: EF 45-50%, diff HK.   CKD (chronic kidney disease), stage III (HCC)    Elevated troponin    Chronically elevated. - level was 0.17-0.10 during recent admission   Essential hypertension    GI bleed 11/2013   Hyperlipidemia    Pancreatitis    Paroxysmal A-fib (HCC)    a. new onset s/p unsuccessful TEE/DCCV on 08/16/2014; b. on amio/eliquis (CHA2DS2VASc = 2-3); c. reports 1-2 hrs of afib ~ q81mo.   Paroxysmal atrial flutter ([place]    a. new onset 07/2014; b. s/p unsuccessful TEE/DCCV 08/16/2014; c. on apixaban.   Sleep-disordered breathing    Has yet to have a sleep study   Stroke ([name]    Past Surgical History:  Procedure Laterality Date   CARDIAC CATHETERIZATION N/A 01/09/2015   Procedure: Left Heart Cath and Coronary Angiography;  Surgeon: Hector [name];  [facility];  Service: Cardiovascular;  Laterality: N/A;   COLONOSCOPY N/A 07/22/2020   Procedure: COLONOSCOPY;  Surgeon: Hector [name];  Location: [facility];  Service: Gastroenterology;  Laterality: N/A;   ELECTROPHYSIOLOGIC STUDY N/A 08/16/2014   Procedure: CARDIOVERSION;  Surgeon: TMinna Merritts [name];  Location: [facility];  Service: Cardiovascular;  Laterality: N/A;   FLEXIBLE SIGMOIDOSCOPY N/A 10/10/2015   Procedure: FLEXIBLE SIGMOIDOSCOPY;  Surgeon: Hector [name];  [name];  Service:  Endoscopy;  Laterality: N/A;   KNEE SURGERY Right    NM MYOVIEW LTD  November 2011   No ischemia or infarction. EF 50-55% ( no improvement from 2009 Myoview EF of 28%   TEE WITHOUT CARDIOVERSION N/A 08/16/2014   Procedure: TRANSESOPHAGEAL ECHOCARDIOGRAM (TEE);  Surgeon: Hector [name];  Location: [facility];  Service: Cardiovascular;  Laterality: N/A;   Family History  Problem Relation Age of Onset   Hypertension Mother    Hyperlipidemia Mother    Diabetes Mother     Social History   Tobacco Use   Smoking status: Former    Packs/day: 1.00    Years: 12.00    Pack years: 12.00    Types: Cigarettes    Quit date: 02/23/1998    Years since quitting: 22.6   Smokeless tobacco: Never  Substance Use Topics   Alcohol use: Not Currently    Alcohol/week: 0.0 standard drinks    Comment: Past heavy drinker   Allergies  Allergen Reactions   Esomeprazole Magnesium Other (See Comments)    Suspected interstitial nephritis 2018   Eggs Or Egg-Derived Products Rash   Prior to Admission medications   Medication Sig Start Date End Date Taking? Authorizing Provider  apixaban (ELIQUIS) 5 MG TABS tablet Take 1 tablet (5 mg total) by mouth 2 (two) times daily. 10/08/20 11/07/20 Yes Gollan, Kathlene November, [name]  atorvastatin (LIPITOR) 80 MG tablet Take 1 tablet (80 mg total) by mouth daily. 10/08/20  Yes Marrianne Mood D, PA-C  carvedilol (COREG) 6.25 MG tablet Take 1 tablet (6.25 mg total) by mouth 2 (two) times daily with a meal. 10/08/20 04/06/21 Yes Visser, Jacquelyn D, PA-C  ferrous gluconate (FERGON) 324 MG tablet Take 324 mg by mouth daily with breakfast.   Yes Provider, Historical, [name]  folic acid (FOLVITE) 1 MG tablet Take 1 mg by mouth daily.   Yes Hector [name]  furosemide (LASIX) 40 MG tablet Take 1 tablet (40 mg total) by mouth daily. 10/08/20 02/05/21 Yes Visser, Jacquelyn D, PA-C  hydrALAZINE (APRESOLINE) 25 MG tablet Take 1 tablet (25 mg total) by mouth 3 (three) times daily. 10/08/20 04/06/21 Yes Visser, Jacquelyn D, PA-C  levothyroxine (SYNTHROID) 25 MCG tablet Take 25 mcg by mouth daily before breakfast.   Yes Provider, Historical, [name]  Multiple Vitamin (MULTIVITAMIN WITH MINERALS) TABS tablet Take 1 tablet by mouth daily.   Yes Provider, Historical, [name]   Review of Systems  Constitutional:  Positive for fatigue. Negative for appetite change.  HENT:  Negative for congestion, postnasal drip and sore throat.   Eyes: Negative.   Respiratory:  Positive for cough  ("little bit"). Negative for shortness of breath.   Cardiovascular:  Negative for chest pain, palpitations and leg swelling.  Gastrointestinal:  Negative for abdominal distention and abdominal pain.  Endocrine: Negative.   Genitourinary: Negative.   Musculoskeletal:  Positive for arthralgias (left arm) and back pain. Negative for neck pain.  Skin:  Negative for color change.  Allergic/Immunologic: Negative.   Neurological:  Positive for weakness (legs and left hand) and numbness (down left arm (pinched nerve)). Negative for dizziness and light-headedness.  Hematological:  Negative for adenopathy. Does not bruise/bleed easily.  Psychiatric/Behavioral:  Negative for dysphoric mood and sleep disturbance (not wearing CPAP due to supply issue; sleeping on 1 pillow). The patient is not nervous/anxious.    Vitals:   10/23/20 1326  BP: 114/80  Pulse: 64  Resp: 20  SpO2: 100%  Weight: 239 lb 8 oz (108.6 kg)  Height: '5\' 11"'$  (1.803 m)   Wt Readings from Last 3 Encounters:  10/23/20 239 lb 8 oz (108.6 kg)  10/08/20 239 lb (108.4 kg)  09/27/20 238 lb 6 oz (108.1 kg)   Lab Results  Component Value Date   CREATININE 2.57 (H) 10/15/2020   CREATININE 2.32 (H) 10/08/2020   CREATININE 2.41 (H) 09/16/2020    Physical Exam Vitals reviewed.  Constitutional:      Appearance: Normal appearance.  HENT:     Head: Normocephalic and atraumatic.  Cardiovascular:     Rate and Rhythm: Normal rate and regular rhythm.  Pulmonary:     Effort: Pulmonary effort is normal. No respiratory distress.     Breath sounds: No wheezing or rales.  Abdominal:     General: There is no distension.     Palpations: Abdomen is soft.  Musculoskeletal:     Cervical back: Normal range of motion and neck supple.     Right lower leg: No edema.     Left lower leg: No edema.  Skin:    General: Skin is warm and dry.  Neurological:     General: No focal deficit present.     Mental Status: He is alert and oriented to  person, place, and time.     Motor: Weakness present.  Psychiatric:        Mood and Affect: Mood normal.        Behavior: Behavior normal.        Thought Content: Thought content normal.     Assessment & Plan:  1: Chronic heart failure with reduced ejection fraction- - NYHA Class II - euvolemic today - weighing daily; reminded to call for an overnight weight gain of >2 pounds or a weekly weight gain of >5 pounds - weight stable from last visit here 1 month ago - on GDMT of carvedilol - unable to tolerate entresto as renal function worsened; will not try spironolactone  - could consider SGLT2 as long as GFR remains >25 - not adding salt to his food - saw cardiology Mickle Plumb) 10/08/20 - BNP 09/12/20 was 3375.0  2: HTN- - BP looks good today - saw PCP at [facility] last week; returns ~ 2 months - BMP from 10/15/20 reviewed and showed sodium 139, potassium 4.6, creatinine 2.57 and GFR 28 - saw nephrology Ardyth Man) 02/10/2019; returns end of September  3: Obstructive sleep apnea- - sleeping well at night  - says that he needs new CPAP machine; advised him to call the company but explained that, unfortunately, the machines seem to be backlogged   Medication bottles reviewed.   Return in 6 months or sooner for any questions/problems before then.

## 2020-10-23 NOTE — Patient Instructions (Signed)
Continue weighing daily and call for an overnight weight gain of > 2 pounds or a weekly weight gain of >5 pounds. 

## 2020-10-29 ENCOUNTER — Other Ambulatory Visit: Payer: Self-pay

## 2020-10-29 ENCOUNTER — Ambulatory Visit (INDEPENDENT_AMBULATORY_CARE_PROVIDER_SITE_OTHER): Payer: Medicaid Other

## 2020-10-29 DIAGNOSIS — I5020 Unspecified systolic (congestive) heart failure: Secondary | ICD-10-CM | POA: Diagnosis not present

## 2020-10-29 MED ORDER — PERFLUTREN LIPID MICROSPHERE
1.0000 mL | INTRAVENOUS | Status: AC | PRN
  Administered 2020-10-29: 2 mL via INTRAVENOUS

## 2020-10-30 LAB — ECHOCARDIOGRAM LIMITED
Calc EF: 34.2 %
S' Lateral: 5.1 cm
Single Plane A2C EF: 36.3 %
Single Plane A4C EF: 35.1 %

## 2020-11-04 ENCOUNTER — Telehealth: Payer: Self-pay

## 2020-11-04 NOTE — Telephone Encounter (Signed)
Able to reach pt regarding his recent ECHO Hector East, PA-C had a chance to review his results and advised   "Echo shows slight improvement in heart squeeze, though still moderately reduced pump function. Mitral valve is mild to moderately leaky.   Can be discussed further with Dr. Rockey Situ at his upcoming visit.  Will Cc Dr. Rockey Situ as Juluis Rainier."  Hector [name] very thankful for the phone call of her results, all questions and concerns were address with nothing further at this time. Will see at next schedule f/u appt in Oct with Dr. Rockey Situ.

## 2020-11-15 ENCOUNTER — Encounter: Payer: Self-pay | Admitting: Emergency Medicine

## 2020-11-15 ENCOUNTER — Other Ambulatory Visit: Payer: Self-pay

## 2020-11-15 ENCOUNTER — Emergency Department
Admission: EM | Admit: 2020-11-15 | Discharge: 2020-11-15 | Disposition: A | Discharge status: Home / Self Care | Payer: Medicaid Other | Attending: Emergency Medicine | Admitting: Emergency Medicine

## 2020-11-15 DIAGNOSIS — I130 Hypertensive heart and chronic kidney disease with heart failure and stage 1 through stage 4 chronic kidney disease, or unspecified chronic kidney disease: Secondary | ICD-10-CM | POA: Insufficient documentation

## 2020-11-15 DIAGNOSIS — Z79899 Other long term (current) drug therapy: Secondary | ICD-10-CM | POA: Insufficient documentation

## 2020-11-15 DIAGNOSIS — R000 Tachycardia, unspecified: Secondary | ICD-10-CM | POA: Diagnosis present

## 2020-11-15 DIAGNOSIS — Z87891 Personal history of nicotine dependence: Secondary | ICD-10-CM | POA: Diagnosis not present

## 2020-11-15 DIAGNOSIS — I5042 Chronic combined systolic (congestive) and diastolic (congestive) heart failure: Secondary | ICD-10-CM | POA: Insufficient documentation

## 2020-11-15 DIAGNOSIS — M25571 Pain in right ankle and joints of right foot: Secondary | ICD-10-CM | POA: Diagnosis not present

## 2020-11-15 DIAGNOSIS — M109 Gout, unspecified: Secondary | ICD-10-CM | POA: Diagnosis not present

## 2020-11-15 DIAGNOSIS — N184 Chronic kidney disease, stage 4 (severe): Secondary | ICD-10-CM | POA: Diagnosis not present

## 2020-11-15 DIAGNOSIS — I4891 Unspecified atrial fibrillation: Secondary | ICD-10-CM | POA: Diagnosis not present

## 2020-11-15 LAB — CBC WITH DIFFERENTIAL/PLATELET
Abs Immature Granulocytes: 0.03 10*3/uL (ref 0.00–0.07)
Basophils Absolute: 0.1 10*3/uL (ref 0.0–0.1)
Basophils Relative: 1 %
Eosinophils Absolute: 0.1 10*3/uL (ref 0.0–0.5)
Eosinophils Relative: 1 %
HCT: 45 % (ref 39.0–52.0)
Hemoglobin: 14.3 g/dL (ref 13.0–17.0)
Immature Granulocytes: 0 %
Lymphocytes Relative: 19 %
Lymphs Abs: 1.8 10*3/uL (ref 0.7–4.0)
MCH: 25.4 pg — ABNORMAL LOW (ref 26.0–34.0)
MCHC: 31.8 g/dL (ref 30.0–36.0)
MCV: 79.9 fL — ABNORMAL LOW (ref 80.0–100.0)
Monocytes Absolute: 0.8 10*3/uL (ref 0.1–1.0)
Monocytes Relative: 8 %
Neutro Abs: 6.6 10*3/uL (ref 1.7–7.7)
Neutrophils Relative %: 71 %
Platelets: 315 10*3/uL (ref 150–400)
RBC: 5.63 MIL/uL (ref 4.22–5.81)
RDW: 23.9 % — ABNORMAL HIGH (ref 11.5–15.5)
Smear Review: NORMAL
WBC: 9.3 10*3/uL (ref 4.0–10.5)
nRBC: 0 % (ref 0.0–0.2)

## 2020-11-15 LAB — BASIC METABOLIC PANEL
Anion gap: 13 (ref 5–15)
BUN: 34 mg/dL — ABNORMAL HIGH (ref 6–20)
CO2: 23 mmol/L (ref 22–32)
Calcium: 9.6 mg/dL (ref 8.9–10.3)
Chloride: 102 mmol/L (ref 98–111)
Creatinine, Ser: 3.05 mg/dL — ABNORMAL HIGH (ref 0.61–1.24)
GFR, Estimated: 23 mL/min — ABNORMAL LOW (ref >60)
Glucose, Bld: 90 mg/dL (ref 70–99)
Potassium: 5 mmol/L (ref 3.5–5.1)
Sodium: 138 mmol/L (ref 135–145)

## 2020-11-15 LAB — MAGNESIUM: Magnesium: 1.9 mg/dL (ref 1.7–2.4)

## 2020-11-15 LAB — TSH: TSH: 12.2 u[IU]/mL — ABNORMAL HIGH (ref 0.350–4.500)

## 2020-11-15 LAB — T4, FREE: Free T4: 0.77 ng/dL (ref 0.61–1.12)

## 2020-11-15 MED ORDER — METOPROLOL TARTRATE 5 MG/5ML IV SOLN
5.0000 mg | Freq: Once | INTRAVENOUS | Status: AC
  Administered 2020-11-15: 5 mg via INTRAVENOUS
  Filled 2020-11-15: qty 5

## 2020-11-15 MED ORDER — CARVEDILOL 6.25 MG PO TABS
6.2500 mg | ORAL_TABLET | Freq: Once | ORAL | Status: AC
  Administered 2020-11-15: 6.25 mg via ORAL
  Filled 2020-11-15: qty 1

## 2020-11-15 MED ORDER — PREDNISONE 10 MG PO TABS: ORAL_TABLET | ORAL | 0 refills | Status: AC

## 2020-11-15 MED ORDER — OXYCODONE HCL 5 MG PO TABS
5.0000 mg | ORAL_TABLET | Freq: Once | ORAL | Status: AC
  Administered 2020-11-15: 5 mg via ORAL
  Filled 2020-11-15: qty 1

## 2020-11-15 MED ORDER — OXYCODONE HCL 5 MG PO TABS: 5.0000 mg | ORAL_TABLET | Freq: Four times a day (QID) | ORAL | 0 refills | Status: AC | PRN

## 2020-11-15 MED ORDER — DILTIAZEM HCL 25 MG/5ML IV SOLN
15.0000 mg | Freq: Once | INTRAVENOUS | Status: AC
  Administered 2020-11-15: 15 mg via INTRAVENOUS
  Filled 2020-11-15: qty 5

## 2020-11-15 NOTE — ED Triage Notes (Signed)
Pt here with c/o gout to the right foot, has been taking home meds with no relief. NAD. HR  in triage 130. Pt states hx of afib.

## 2020-11-15 NOTE — Discharge Instructions (Addendum)
Take the steroids as prescribed.  Please follow the instructions carefully given that you take a different amount every day to help prevent rebound gout.  Also take the oxycodone for which some pain.  Return to the ER if develop worsening pain, redness, fevers or any other concerns.  Your heart rate was really elevated most likely secondary to not taking her medications.  Is important that you are compliant with your medication.  Your kidney function is slightly elevated which could be from your elevated heart rates.  Please follow-up with your cardiologist on Monday to have a kidney function rechecked.

## 2020-11-15 NOTE — ED Provider Notes (Signed)
[facility] Emergency Department Provider Note  ____________________________________________   Event Date/Time   First MD Initiated Contact with Patient 11/15/20 1413     (approximate)  I have reviewed the triage vital signs and the nursing notes.   HISTORY  Chief Complaint Foot Pain and Tachycardia    HPI Hector [name] is a 55 y.o. male with history of A. fib who comes in with concern for foot pain.  Patient reports having 2 days of foot pain.  Patient states that it feels like his prior gout attacks.  He reports that the pain is into the right pinky joint of the right foot.  Patient is the pain is been there for 2 days, constant, hurts to ambulate.  He denies any falls or twisting his leg.  He states that it feels very similar to his prior gout flares.  He states that it typically gets better with prednisone and pain medication.  He denies any history of alcohol use recently.  Incidentally patient was noted to have heart rates in the 130s.  On review of records patient has an echocardiogram of an EF of 25%.  Patient is not compliant with his medications.  He states that he missed his Coreg this morning.  He is noncompliant with his Eliquis.  Patient denies any chest pain or shortness of breath or any other symptoms from his elevated heart rates.            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 12/2007 MV: EF 28%, no isch;  b. 8/12 Echo: EF 25-35%; c. 02/2014 Echo: EF 20-25%; d. 12/2014 Cath: minimal CAD; e. 01/2015 Echo: EF 50-55%;  d. 05/2016 Echo: EF 30-35%, diff HK, gr2 DD; e. 11/2016 Echo: EF 45-50%, diff HK.   Chronic combined systolic (congestive) and diastolic (congestive) heart failure ([place])    a. 05/2016 Most recent Echo: EF 30-35%, diff HK, Gr2 DD, mild MR, mod dil LA; b. 11/2016 Echo: EF 45-50%, diff HK.   CKD (chronic kidney disease), stage III (HCC)    Elevated troponin     Chronically elevated. - level was 0.17-0.10 during recent admission   Essential hypertension    GI bleed 11/2013   Hyperlipidemia    Pancreatitis    Paroxysmal A-fib (HCC)    a. new onset s/p unsuccessful TEE/DCCV on 08/16/2014; b. on amio/eliquis (CHA2DS2VASc = 2-3); c. reports 1-2 hrs of afib ~ q46mo.   Paroxysmal atrial flutter ([place]    a. new onset 07/2014; b. s/p unsuccessful TEE/DCCV 08/16/2014; c. on apixaban.   Sleep-disordered breathing    Has yet to have a sleep study   St[facility]     Patient Active Problem List   Diagnosis Date Noted   Congestive heart failure (CHF) ([facility] 09/12/2020   CKD (chronic kidney disease) stage 4, GFR 15-29 ml/min ([facility] 07/21/2020   GIB (gastrointestinal bleeding) 07/21/2020   Acute blood loss anemia 07/21/2020   Hyperthyroidism without thyroid nodule 02/03/2020   Acute respiratory distress syndrome (ARDS) due to COVID-19 virus ([facility] 12/31/2019   Acute renal failure superimposed on stage 3b chronic kidney disease ([facility] 12/31/2019   Hyperkalemia 12/31/2019   Hyponatremia 12/31/2019   Obstructive sleep apnea 04/21/2016   Nonischemic cardiomyopathy ([facility] 05/26/2015   Chronic combined systolic (congestive) and diastolic (congestive) heart failure ([facility] 04/01/2015   Essential hypertension 04/01/2015   Sinus bradycardia 01/29/2015   Paroxysmal atrial fibrillation ([facility] 08/16/2014  Paroxysmal atrial flutter ([place]) 08/14/2014    Class: Acute   Chronic kidney disease (CKD), stage III (moderate) ([place]) 07/02/2014   Hypertensive heart disease    Hyperlipidemia     Past Surgical History:  Procedure Laterality Date   CARDIAC CATHETERIZATION N/A 01/09/2015   Procedure: Left Heart Cath and Coronary Angiography;  Surgeon: [name], MD;  Location: [place] [facility];  Service: Cardiovascular;  Laterality: N/A;   COLONOSCOPY N/A 07/22/2020   Procedure: COLONOSCOPY;  Surgeon: Toledo, Benay Pike, MD;  Location: [facility]COPY;  Service:  Gastroenterology;  Laterality: N/A;   ELECTROPHYSIOLOGIC STUDY N/A 08/16/2014   Procedure: CARDIOVERSION;  Surgeon: [name], MD;  Location: [facility] ORS;  Service: Cardiovascular;  Laterality: N/A;   FLEXIBLE SIGMOIDOSCOPY N/A 10/10/2015   Procedure: FLEXIBLE SIGMOIDOSCOPY;  Surgeon: [name], MD;  Location: [facility] ENDOSCOPY;  Service: Endoscopy;  Laterality: N/A;   KNEE SURGERY Right    NM MYOVIEW LTD  November 2011   No ischemia or infarction. EF 50-55% ( no improvement from 2009 Myoview EF of 28%   TEE WITHOUT CARDIOVERSION N/A 08/16/2014   Procedure: TRANSESOPHAGEAL ECHOCARDIOGRAM (TEE);  Surgeon: [name], MD;  Location: [facility];  Service: Cardiovascular;  Laterality: N/A;    Prior to Admission medications   Medication Sig Start Date End Date Taking? Authorizing Provider  apixaban (ELIQUIS) 5 MG TABS tablet Take 1 tablet (5 mg total) by mouth 2 (two) times daily. 10/08/20 11/07/20  [name], MD  atorvastatin (LIPITOR) 80 MG tablet Take 1 tablet (80 mg total) by mouth daily. 10/08/20   Marrianne Mood D, PA-C  carvedilol (COREG) 6.25 MG tablet Take 1 tablet (6.25 mg total) by mouth 2 (two) times daily with a meal. 10/08/20 04/06/21  Marrianne Mood D, PA-C  ferrous gluconate (FERGON) 324 MG tablet Take 324 mg by mouth daily with breakfast.    [provider]  folic acid (FOLVITE) 1 MG tablet Take 1 mg by mouth daily.    Nolberto Hanlon, MD  furosemide (LASIX) 40 MG tablet Take 1 tablet (40 mg total) by mouth daily. 10/08/20 02/05/21  Marrianne Mood D, PA-C  hydrALAZINE (APRESOLINE) 25 MG tablet Take 1 tablet (25 mg total) by mouth 3 (three) times daily. 10/08/20 04/06/21  Marrianne Mood D, PA-C  levothyroxine (SYNTHROID) 25 MCG tablet Take 25 mcg by mouth daily before breakfast.    [provider]  Multiple Vitamin (MULTIVITAMIN WITH MINERALS) TABS tablet Take 1 tablet by mouth daily.    [provider]    Allergies Esomeprazole  magnesium and Eggs or egg-derived products  Family History  Problem Relation Age of Onset   Hypertension Mother    Hyperlipidemia Mother    Diabetes Mother     Social History Social History   Tobacco Use   Smoking status: Former    Packs/day: 1.00    Years: 12.00    Pack years: 12.00    Types: Cigarettes    Quit date: 02/23/1998    Years since quitting: 22.7   Smokeless tobacco: Never  Vaping Use   Vaping Use: Never used  Substance Use Topics   Alcohol use: Not Currently    Alcohol/week: 0.0 standard drinks    Comment: Past heavy drinker   Drug use: No      Review of Systems Constitutional: No fever/chills Eyes: No visual changes. ENT: No sore throat. Cardiovascular: Denies chest pain.  Elevated heart rate Respiratory: Denies shortness of breath. Gastrointestinal: No abdominal pain.  No nausea, no vomiting.  No diarrhea.  No constipation. Genitourinary: Negative for dysuria. Musculoskeletal: Negative for back pain.  Foot pain Skin: Negative for rash. Neurological: Negative for headaches, focal weakness or numbness. All other ROS negative ____________________________________________   PHYSICAL EXAM:  VITAL SIGNS: ED Triage Vitals  Enc Vitals Group     BP 11/15/20 1357 (!) 130/114     Pulse Rate 11/15/20 1357 (!) 130     Resp 11/15/20 1357 18     Temp 11/15/20 1357 97.8 F (36.6 C)     Temp Source 11/15/20 1357 Oral     SpO2 11/15/20 1357 95 %     Weight 11/15/20 1359 256 lb (116.1 kg)     Height 11/15/20 1359 '5\' 11"'$  (1.803 m)     Head Circumference --      Peak Flow --      Pain Score 11/15/20 1358 8     Pain Loc --      Pain Edu? --      Excl. in [place]? --     Constitutional: Alert and oriented. Well appearing and in no acute distress. Eyes: Conjunctivae are normal. EOMI. Head: Atraumatic. Nose: No congestion/rhinnorhea. Mouth/Throat: Mucous membranes are moist.   Neck: No stridor. Trachea Midline. FROM Cardiovascular tachycardia. Grossly normal  heart sounds.  Good peripheral circulation. Respiratory: Normal respiratory effort.  No retractions. Lungs CTAB. Gastrointestinal: Soft and nontender. No distention. No abdominal bruits.  Musculoskeletal: Some pain at the right pinky toe without any obvious warmth or erythema warm and well-perfused.  Calluses noted..   2+ pulse  Neurologic:  Normal speech and language. No gross focal neurologic deficits are appreciated.  Skin:  Skin is warm, dry and intact. No rash noted. Psychiatric: Mood and affect are normal. Speech and behavior are normal. GU: Deferred   ____________________________________________   LABS (all labs ordered are listed, but only abnormal results are displayed)  Labs Reviewed  CBC WITH DIFFERENTIAL/PLATELET  BASIC METABOLIC PANEL  MAGNESIUM  TSH  T4, FREE   ____________________________________________   ED ECG REPORT I, Vanessa Laurel, the attending physician, personally viewed and interpreted this ECG.  Patient with supraventricular tachycardia in the 130s without any ST elevation or T wave inversions except for aVL, normal intervals ____________________________________________    PROCEDURES  Procedure(s) performed (including Critical Care):  .1-3 Lead EKG Interpretation Performed by: Vanessa [place], MD Authorized by: Vanessa [place], MD     Interpretation: normal     ECG rate:  90s   Rhythm: atrial fibrillation     Ectopy: none     Conduction: normal   .Critical Care Performed by: Vanessa [place], MD Authorized by: Vanessa [place], MD   Critical care provider statement:    Critical care time (minutes):  35   Critical care was necessary to treat or prevent imminent or life-threatening deterioration of the following conditions: Afib wtih rvr.   Critical care was time spent personally by me on the following activities:  Discussions with consultants, evaluation of patient's response to treatment, examination of patient, ordering and performing treatments and  interventions, ordering and review of laboratory studies, ordering and review of radiographic studies, pulse oximetry, re-evaluation of patient's condition, obtaining history from patient or surrogate and review of old charts   ____________________________________________   INITIAL IMPRESSION / [place] / ED COURSE  Hector [name] was evaluated in Emergency Department on 11/15/2020 for the symptoms described in the history of present illness. He was evaluated  in the context of the global COVID-19 pandemic, which necessitated consideration that the patient might be at risk for infection with the SARS-CoV-2 virus that causes COVID-19. Institutional protocols and algorithms that pertain to the evaluation of patients at risk for COVID-19 are in a state of rapid change based on information released by regulatory bodies including the CDC and federal and state organizations. These policies and algorithms were followed during the patient's care in the ED.    Patient comes in with concerns for foot pain.  Denies any falls or trauma to suggest need an x-ray.  Could be some early gout but no obvious redness or tenderness at this time.  Feels warm and well-perfused.  However more notably is patient's heart rate's that are significantly elevated.  Being read as an SVT but I suspect that it is more likely an underlying atrial flutter or atrial fibrillation due to his prior history of this.  Patient was given some IV metoprolol without any effect on his heart rates.  We will try dose of IV diltiazem  His heart rates are down in the 70s to 90s.  Patient feeling well.  Patient most likely went back into his A. fib due to noncompliance with medications.  He does not look significantly fluid overloaded on examination.  Kidney function slightly elevated but most likely just secondary to his elevated heart rates and underperfusion.  Now that his heart rates are better I suspect that this will get better with  time.  Do not want to give him fluids due to his poor EF he does not look significantly fluid overloaded to suggest any diuresis at this time.  He remains asymptomatic without any chest pain or shortness of breath.  We discussed admission but patient would really like to go home states he is only here for his foot pain. Will give a course of steroids and oxycodone to help with the early gout.  Patient understands not to drive or work while on this.  I discussed the provisional nature of ED diagnosis, the treatment so far, the ongoing plan of care, follow up appointments and return precautions with the patient and any family or support people present. They expressed understanding and agreed with the plan, discharged home.          ____________________________________________   FINAL CLINICAL IMPRESSION(S) / ED DIAGNOSES   Final diagnoses:  Atrial fibrillation with rapid ventricular response (HCC)  Acute gout, unspecified cause, unspecified site      MEDICATIONS GIVEN DURING THIS VISIT:  Medications  metoprolol tartrate (LOPRESSOR) injection 5 mg (5 mg Intravenous Given 11/15/20 1424)  carvedilol (COREG) tablet 6.25 mg (6.25 mg Oral Given 11/15/20 1436)  diltiazem (CARDIZEM) injection 15 mg (15 mg Intravenous Given 11/15/20 1450)  oxyCODONE (Oxy IR/ROXICODONE) immediate release tablet 5 mg (5 mg Oral Given 11/15/20 1502)     ED Discharge Orders          Ordered    predniSONE (DELTASONE) 10 MG tablet        11/15/20 1529    oxyCODONE (ROXICODONE) 5 MG immediate release tablet  Every 6 hours PRN        11/15/20 1529             Note:  This document was prepared using Dragon voice recognition software and may include unintentional dictation errors.    Vanessa [place], MD 11/15/20 [phone]

## 2020-11-15 NOTE — ED Triage Notes (Signed)
Pt comes into the ED via [facility] from home c/o gout to the right foot s/o eating red meat 2 days ago.  Pt has been taking home meds with no relief.  Pt ambulatory with cane.   119/89 120 HR 98% RA

## 2020-11-15 NOTE — ED Provider Notes (Signed)
HPI: Pt is a 55 y.o. male who presents with complaints of foot pain.   The patient p/w  R foot pain. 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 12/2007 MV: EF 28%, no isch;  b. 8/12 Echo: EF 25-35%; c. 02/2014 Echo: EF 20-25%; d. 12/2014 Cath: minimal CAD; e. 01/2015 Echo: EF 50-55%;  d. 05/2016 Echo: EF 30-35%, diff HK, gr2 DD; e. 11/2016 Echo: EF 45-50%, diff HK.   Chronic combined systolic (congestive) and diastolic (congestive) heart failure ([place])    a. 05/2016 Most recent Echo: EF 30-35%, diff HK, Gr2 DD, mild MR, mod dil LA; b. 11/2016 Echo: EF 45-50%, diff HK.   CKD (chronic kidney disease), stage III (HCC)    Elevated troponin    Chronically elevated. - level was 0.17-0.10 during recent admission   Essential hypertension    GI bleed 11/2013   Hyperlipidemia    Pancreatitis    Paroxysmal A-fib (HCC)    a. new onset s/p unsuccessful TEE/DCCV on 08/16/2014; b. on amio/eliquis (CHA2DS2VASc = 2-3); c. reports 1-2 hrs of afib ~ q29mo.   [place]    a. new onset 07/2014; b. s/p unsuccessful TEE/DCCV 08/16/2014; c. on apixaban.   Sleep-disordered breathing    Has yet to have a sleep study   Stroke ([facility]    There were no vitals filed for this visit.  Focused Physical Exam: Gen: No acute distress Head: atraumatic, normocephalic Eyes: Extraocular movements grossly intact; conjunctiva clear CV: tachycardia  Lung: No increased WOB, no stridor GI: ND, no obvious masses Neuro: Alert and awake No obvious redness/warmth on the foot but some pain near the Right pinky toe.    Medical Decision Making and Plan: Given the patient's initial medical screening exam, the following diagnostic evaluation has been ordered. The patient will be placed in the appropriate treatment space, once one is available, to complete the evaluation and treatment. I have discussed  the plan of care with the patient and I have advised the patient that an ED physician or mid-level practitioner will reevaluate their condition after the test results have been received, as the results may give them additional insight into the type of treatment they may need.   Diagnostics: unable to dispo in triage due to tachycardia. Will get ekg/labs.   Treatments: none immediately   FVanessa Pollock MD 11/15/20 1358

## 2020-11-30 ENCOUNTER — Observation Stay
Admission: EM | Admit: 2020-11-30 | Discharge: 2020-12-01 | Disposition: A | Discharge status: Home / Self Care | Payer: Medicaid Other | Attending: Internal Medicine | Admitting: Internal Medicine

## 2020-11-30 ENCOUNTER — Other Ambulatory Visit: Payer: Self-pay

## 2020-11-30 DIAGNOSIS — N184 Chronic kidney disease, stage 4 (severe): Secondary | ICD-10-CM | POA: Diagnosis not present

## 2020-11-30 DIAGNOSIS — I5022 Chronic systolic (congestive) heart failure: Secondary | ICD-10-CM | POA: Diagnosis not present

## 2020-11-30 DIAGNOSIS — Z8673 Personal history of transient ischemic attack (TIA), and cerebral infarction without residual deficits: Secondary | ICD-10-CM | POA: Insufficient documentation

## 2020-11-30 DIAGNOSIS — E039 Hypothyroidism, unspecified: Secondary | ICD-10-CM | POA: Diagnosis not present

## 2020-11-30 DIAGNOSIS — I480 Paroxysmal atrial fibrillation: Secondary | ICD-10-CM | POA: Diagnosis not present

## 2020-11-30 DIAGNOSIS — F1091 Alcohol use, unspecified, in remission: Secondary | ICD-10-CM | POA: Insufficient documentation

## 2020-11-30 DIAGNOSIS — F1010 Alcohol abuse, uncomplicated: Secondary | ICD-10-CM

## 2020-11-30 DIAGNOSIS — Z20822 Contact with and (suspected) exposure to covid-19: Secondary | ICD-10-CM | POA: Insufficient documentation

## 2020-11-30 DIAGNOSIS — I130 Hypertensive heart and chronic kidney disease with heart failure and stage 1 through stage 4 chronic kidney disease, or unspecified chronic kidney disease: Secondary | ICD-10-CM | POA: Insufficient documentation

## 2020-11-30 DIAGNOSIS — Z87891 Personal history of nicotine dependence: Secondary | ICD-10-CM | POA: Insufficient documentation

## 2020-11-30 DIAGNOSIS — E782 Mixed hyperlipidemia: Secondary | ICD-10-CM | POA: Diagnosis present

## 2020-11-30 DIAGNOSIS — I10 Essential (primary) hypertension: Secondary | ICD-10-CM | POA: Diagnosis present

## 2020-11-30 DIAGNOSIS — D509 Iron deficiency anemia, unspecified: Secondary | ICD-10-CM | POA: Diagnosis present

## 2020-11-30 DIAGNOSIS — N2889 Other specified disorders of kidney and ureter: Secondary | ICD-10-CM | POA: Diagnosis present

## 2020-11-30 DIAGNOSIS — K922 Gastrointestinal hemorrhage, unspecified: Principal | ICD-10-CM | POA: Diagnosis present

## 2020-11-30 DIAGNOSIS — D62 Acute posthemorrhagic anemia: Secondary | ICD-10-CM | POA: Diagnosis present

## 2020-11-30 DIAGNOSIS — K625 Hemorrhage of anus and rectum: Secondary | ICD-10-CM | POA: Diagnosis present

## 2020-11-30 DIAGNOSIS — E785 Hyperlipidemia, unspecified: Secondary | ICD-10-CM

## 2020-11-30 DIAGNOSIS — G4733 Obstructive sleep apnea (adult) (pediatric): Secondary | ICD-10-CM | POA: Diagnosis present

## 2020-11-30 DIAGNOSIS — I639 Cerebral infarction, unspecified: Secondary | ICD-10-CM | POA: Diagnosis present

## 2020-11-30 LAB — CBC WITH DIFFERENTIAL/PLATELET
Abs Immature Granulocytes: 0.09 10*3/uL — ABNORMAL HIGH (ref 0.00–0.07)
Basophils Absolute: 0 10*3/uL (ref 0.0–0.1)
Basophils Relative: 0 %
Eosinophils Absolute: 0.1 10*3/uL (ref 0.0–0.5)
Eosinophils Relative: 1 %
HCT: 35.6 % — ABNORMAL LOW (ref 39.0–52.0)
Hemoglobin: 10.8 g/dL — ABNORMAL LOW (ref 13.0–17.0)
Immature Granulocytes: 1 %
Lymphocytes Relative: 14 %
Lymphs Abs: 1.3 10*3/uL (ref 0.7–4.0)
MCH: 24.5 pg — ABNORMAL LOW (ref 26.0–34.0)
MCHC: 30.3 g/dL (ref 30.0–36.0)
MCV: 80.7 fL (ref 80.0–100.0)
Monocytes Absolute: 0.7 10*3/uL (ref 0.1–1.0)
Monocytes Relative: 7 %
Neutro Abs: 7 10*3/uL (ref 1.7–7.7)
Neutrophils Relative %: 77 %
Platelets: 246 10*3/uL (ref 150–400)
RBC: 4.41 MIL/uL (ref 4.22–5.81)
RDW: 23 % — ABNORMAL HIGH (ref 11.5–15.5)
Smear Review: NORMAL
WBC: 9.2 10*3/uL (ref 4.0–10.5)
nRBC: 0 % (ref 0.0–0.2)

## 2020-11-30 LAB — CBC
HCT: 33.2 % — ABNORMAL LOW (ref 39.0–52.0)
HCT: 33.3 % — ABNORMAL LOW (ref 39.0–52.0)
HCT: 30.1 % — ABNORMAL LOW (ref 39.0–52.0)
Hemoglobin: 10.2 g/dL — ABNORMAL LOW (ref 13.0–17.0)
Hemoglobin: 10.3 g/dL — ABNORMAL LOW (ref 13.0–17.0)
Hemoglobin: 9.6 g/dL — ABNORMAL LOW (ref 13.0–17.0)
MCH: 25.2 pg — ABNORMAL LOW (ref 26.0–34.0)
MCH: 25.4 pg — ABNORMAL LOW (ref 26.0–34.0)
MCH: 25.8 pg — ABNORMAL LOW (ref 26.0–34.0)
MCHC: 30.7 g/dL (ref 30.0–36.0)
MCHC: 30.9 g/dL (ref 30.0–36.0)
MCHC: 31.9 g/dL (ref 30.0–36.0)
MCV: 82.2 fL (ref 80.0–100.0)
MCV: 82 fL (ref 80.0–100.0)
MCV: 80.9 fL (ref 80.0–100.0)
Platelets: 246 10*3/uL (ref 150–400)
Platelets: 246 10*3/uL (ref 150–400)
Platelets: 232 10*3/uL (ref 150–400)
RBC: 4.04 MIL/uL — ABNORMAL LOW (ref 4.22–5.81)
RBC: 4.06 MIL/uL — ABNORMAL LOW (ref 4.22–5.81)
RBC: 3.72 MIL/uL — ABNORMAL LOW (ref 4.22–5.81)
RDW: 22.9 % — ABNORMAL HIGH (ref 11.5–15.5)
RDW: 23.2 % — ABNORMAL HIGH (ref 11.5–15.5)
RDW: 23.1 % — ABNORMAL HIGH (ref 11.5–15.5)
WBC: 10.2 10*3/uL (ref 4.0–10.5)
WBC: 9.6 10*3/uL (ref 4.0–10.5)
WBC: 9.5 10*3/uL (ref 4.0–10.5)
nRBC: 0 % (ref 0.0–0.2)
nRBC: 0 % (ref 0.0–0.2)
nRBC: 0 % (ref 0.0–0.2)

## 2020-11-30 LAB — COMPREHENSIVE METABOLIC PANEL
ALT: 9 U/L (ref 0–44)
AST: 11 U/L — ABNORMAL LOW (ref 15–41)
Albumin: 3.4 g/dL — ABNORMAL LOW (ref 3.5–5.0)
Alkaline Phosphatase: 60 U/L (ref 38–126)
Anion gap: 6 (ref 5–15)
BUN: 32 mg/dL — ABNORMAL HIGH (ref 6–20)
CO2: 24 mmol/L (ref 22–32)
Calcium: 8.5 mg/dL — ABNORMAL LOW (ref 8.9–10.3)
Chloride: 109 mmol/L (ref 98–111)
Creatinine, Ser: 2.26 mg/dL — ABNORMAL HIGH (ref 0.61–1.24)
GFR, Estimated: 33 mL/min — ABNORMAL LOW (ref >60)
Glucose, Bld: 125 mg/dL — ABNORMAL HIGH (ref 70–99)
Potassium: 4.7 mmol/L (ref 3.5–5.1)
Sodium: 139 mmol/L (ref 135–145)
Total Bilirubin: 1 mg/dL (ref 0.3–1.2)
Total Protein: 6 g/dL — ABNORMAL LOW (ref 6.5–8.1)

## 2020-11-30 LAB — PROTIME-INR
INR: 1.2 (ref 0.8–1.2)
Prothrombin Time: 15.4 seconds — ABNORMAL HIGH (ref 11.4–15.2)

## 2020-11-30 LAB — RESP PANEL BY RT-PCR (FLU A&B, COVID) ARPGX2
Influenza A by PCR: NEGATIVE
Influenza B by PCR: NEGATIVE
SARS Coronavirus 2 by RT PCR: NEGATIVE

## 2020-11-30 LAB — BRAIN NATRIURETIC PEPTIDE: B Natriuretic Peptide: 948.8 pg/mL — ABNORMAL HIGH (ref 0.0–100.0)

## 2020-11-30 LAB — TYPE AND SCREEN
ABO/RH(D): O POS
Antibody Screen: NEGATIVE

## 2020-11-30 LAB — APTT: aPTT: 40 seconds — ABNORMAL HIGH (ref 24–36)

## 2020-11-30 MED ORDER — FUROSEMIDE 40 MG PO TABS
40.0000 mg | ORAL_TABLET | Freq: Every day | ORAL | Status: DC
  Administered 2020-11-30 – 2020-12-01 (×2): 40 mg via ORAL
  Filled 2020-11-30 (×2): qty 1

## 2020-11-30 MED ORDER — FUROSEMIDE 40 MG PO TABS: 40.0000 mg | ORAL_TABLET | Freq: Every day | ORAL | Status: DC

## 2020-11-30 MED ORDER — ATORVASTATIN CALCIUM 20 MG PO TABS
80.0000 mg | ORAL_TABLET | Freq: Every day | ORAL | Status: DC
Start: 2020-11-30 — End: 2020-12-01
  Administered 2020-11-30 – 2020-12-01 (×2): 80 mg via ORAL
  Filled 2020-11-30 (×2): qty 4

## 2020-11-30 MED ORDER — ACETAMINOPHEN 325 MG PO TABS: 650.0000 mg | ORAL_TABLET | Freq: Four times a day (QID) | ORAL | Status: DC | PRN

## 2020-11-30 MED ORDER — SODIUM CHLORIDE 0.9 % IV SOLN: INTRAVENOUS | Status: DC

## 2020-11-30 MED ORDER — ONDANSETRON HCL 4 MG/2ML IJ SOLN: 4.0000 mg | Freq: Three times a day (TID) | INTRAMUSCULAR | Status: DC | PRN

## 2020-11-30 MED ORDER — FERROUS GLUCONATE 324 (38 FE) MG PO TABS
324.0000 mg | ORAL_TABLET | Freq: Every day | ORAL | Status: DC
  Administered 2020-12-01: 324 mg via ORAL
  Filled 2020-11-30: qty 1

## 2020-11-30 MED ORDER — HYDRALAZINE HCL 20 MG/ML IJ SOLN: 5.0000 mg | INTRAMUSCULAR | Status: DC | PRN

## 2020-11-30 MED ORDER — CARVEDILOL 6.25 MG PO TABS
6.2500 mg | ORAL_TABLET | Freq: Two times a day (BID) | ORAL | Status: DC
  Administered 2020-11-30 – 2020-12-01 (×2): 6.25 mg via ORAL
  Filled 2020-11-30 (×2): qty 1

## 2020-11-30 MED ORDER — HYDRALAZINE HCL 20 MG/ML IJ SOLN
5.0000 mg | INTRAMUSCULAR | Status: DC | PRN
  Administered 2020-11-30: 5 mg via INTRAVENOUS
  Filled 2020-11-30: qty 1

## 2020-11-30 MED ORDER — ALLOPURINOL 100 MG PO TABS
100.0000 mg | ORAL_TABLET | Freq: Every day | ORAL | Status: DC
  Administered 2020-11-30 – 2020-12-01 (×2): 100 mg via ORAL
  Filled 2020-11-30 (×2): qty 1

## 2020-11-30 MED ORDER — LEVOTHYROXINE SODIUM 100 MCG PO TABS
100.0000 ug | ORAL_TABLET | Freq: Every day | ORAL | Status: DC
  Administered 2020-11-30 – 2020-12-01 (×2): 100 ug via ORAL
  Filled 2020-11-30: qty 1
  Filled 2020-11-30: qty 2

## 2020-11-30 NOTE — ED Notes (Signed)
Report from Jessica, RN

## 2020-11-30 NOTE — ED Triage Notes (Signed)
Pt arrives via EMS from home- pt has a hx of hemorrhoids and states they flared back up today and he had a large amount of blood in his stool- pt states he was here in June with them and had to have blood transfusions- pt A&Ox4

## 2020-11-30 NOTE — H&P (Addendum)
History and Physical    Hector [name] [id number] DOB: 20-Jul-1965 DOA: 11/30/2020  Referring MD/NP/PA:   PCP: [name], MD   Patient coming from:  The patient is coming from home.  At baseline, pt is independent for most of ADL.        Chief Complaint: rectal bleeding  HPI: Hector [name] is a 55 y.o. male with medical history significant of A. fib on Eliquis, pancreatitis, sCHF with EF 30-35%, CKD-IV, HTN, HLD, stroke, hypothyroidism, alcohol abuse in remission for 8 years, GI bleeding, OSA, anemia, COVID infection 06/19/2020, who presents with rectal bleeding.  Patient states that he has 2 episode of rectal bleeding, one episode at home and another episode in emergency room, with bright red blood.  Patient does not have nausea or vomiting.  Patient states that he has mild lower abdominal discomfort.  No chest pain, cough, shortness of breath, fever or chills.  No dizziness or lightheadedness.  No symptoms of UTI.  Patient states that he took his last dose of Eliquis last night.  Of note, patient had history of rectal bleeding which required blood transfusion, and had colonoscopy which showed diverticulosis without active bleeding 07/22/2020.  ED Course: pt was found to have hemoglobin 14.3 on 11/15/2020 --> 10.8 today, pending COVID-19 PCR, renal function at baseline, temperature normal, blood pressure 168/116, heart rate 69, RR 24, oxygen saturation 98% on room air.  Patient is placed on MedSurg bed for observation.  Dr. Vicente Males of GI is consulted.   Review of Systems:   General: no fevers, chills, no body weight gain, fatigue HEENT: no blurry vision, hearing changes or sore throat Respiratory: no dyspnea, coughing, wheezing CV: no chest pain, no palpitations GI: no nausea, vomiting, has rectal bleeding and lower abdominal discomfort, no diarrhea, constipation. GU: no dysuria, burning on urination, increased urinary frequency, hematuria  Ext: no leg edema Neuro:  no unilateral weakness, numbness, or tingling, no vision change or hearing loss Skin: no rash, no skin tear. MSK: No muscle spasm, no deformity, no limitation of range of movement in spin Heme: No easy bruising.  Travel history: No recent long distant travel.  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 12/2007 MV: EF 28%, no isch;  b. 8/12 Echo: EF 25-35%; c. 02/2014 Echo: EF 20-25%; d. 12/2014 Cath: minimal CAD; e. 01/2015 Echo: EF 50-55%;  d. 05/2016 Echo: EF 30-35%, diff HK, gr2 DD; e. 11/2016 Echo: EF 45-50%, diff HK.   Chronic combined systolic (congestive) and diastolic (congestive) heart failure ([place])    a. 05/2016 Most recent Echo: EF 30-35%, diff HK, Gr2 DD, mild MR, mod dil LA; b. 11/2016 Echo: EF 45-50%, diff HK.   CKD (chronic kidney disease), stage III (HCC)    Elevated troponin    Chronically elevated. - level was 0.17-0.10 during recent admission   Essential hypertension    GI bleed 11/2013   Hyperlipidemia    Pancreatitis    Paroxysmal A-fib (HCC)    a. new onset s/p unsuccessful TEE/DCCV on 08/16/2014; b. on amio/eliquis (CHA2DS2VASc = 2-3); c. reports 1-2 hrs of afib ~ q62mo.   Paroxysmal atrial flutter ([place]    a. new onset 07/2014; b. s/p unsuccessful TEE/DCCV 08/16/2014; c. on apixaban.  Sleep-disordered breathing    Has yet to have a sleep study   Stroke [facility])     Past Surgical History:  Procedure Laterality Date   CARDIAC CATHETERIZATION N/A 01/09/2015   Procedure: Left Heart Cath and Coronary Angiography;  Surgeon: [name], MD;  Location: [place] CV LAB;  Service: Cardiovascular;  Laterality: N/A;   COLONOSCOPY N/A 07/22/2020   Procedure: COLONOSCOPY;  Surgeon: Toledo, Benay Pike, MD;  Location: [facility] ENDOSCOPY;  Service:  Gastroenterology;  Laterality: N/A;   ELECTROPHYSIOLOGIC STUDY N/A 08/16/2014   Procedure: CARDIOVERSION;  Surgeon: [name], MD;  Location: [facility] ORS;  Service: Cardiovascular;  Laterality: N/A;   FLEXIBLE SIGMOIDOSCOPY N/A 10/10/2015   Procedure: FLEXIBLE SIGMOIDOSCOPY;  Surgeon: [name], MD;  Location: [facility] ENDOSCOPY;  Service: Endoscopy;  Laterality: N/A;   KNEE SURGERY Right    NM MYOVIEW LTD  November 2011   No ischemia or infarction. EF 50-55% ( no improvement from 2009 Myoview EF of 28%   TEE WITHOUT CARDIOVERSION N/A 08/16/2014   Procedure: TRANSESOPHAGEAL ECHOCARDIOGRAM (TEE);  Surgeon: [name], MD;  Location: [facility] ORS;  Service: Cardiovascular;  Laterality: N/A;    Social History:  reports that he quit smoking about 22 years ago. His smoking use included cigarettes. He has a 12.00 pack-year smoking history. He has never used smokeless tobacco. He reports that he does not currently use alcohol. He reports that he does not use drugs.  Family History:  Family History  Problem Relation Age of Onset   Hypertension Mother    Hyperlipidemia Mother    Diabetes Mother      Prior to Admission medications   Medication Sig Start Date End Date Taking? Authorizing Provider  apixaban (ELIQUIS) 5 MG TABS tablet Take 1 tablet (5 mg total) by mouth 2 (two) times daily. 10/08/20 11/07/20  [name], MD  atorvastatin (LIPITOR) 80 MG tablet Take 1 tablet (80 mg total) by mouth daily. 10/08/20   Marrianne Mood D, PA-C  carvedilol (COREG) 6.25 MG tablet Take 1 tablet (6.25 mg total) by mouth 2 (two) times daily with a meal. 10/08/20 04/06/21  Marrianne Mood D, PA-C  ferrous gluconate (FERGON) 324 MG tablet Take 324 mg by mouth daily with breakfast.    [provider]  folic acid (FOLVITE) 1 MG tablet Take 1 mg by mouth daily.    Nolberto Hanlon, MD  furosemide (LASIX) 40 MG tablet Take 1 tablet (40 mg total) by mouth daily. 10/08/20 02/05/21  Marrianne Mood D,  PA-C  hydrALAZINE (APRESOLINE) 25 MG tablet Take 1 tablet (25 mg total) by mouth 3 (three) times daily. 10/08/20 04/06/21  Marrianne Mood D, PA-C  levothyroxine (SYNTHROID) 25 MCG tablet Take 25 mcg by mouth daily before breakfast.    [provider]  Multiple Vitamin (MULTIVITAMIN WITH MINERALS) TABS tablet Take 1 tablet by mouth daily.    [provider]    Physical Exam: Vitals:   11/30/20 0850 11/30/20 0900 11/30/20 0930 11/30/20 1000  BP: (!) 162/115 (!) 145/111 (!) 163/103 (!) 168/116  Pulse: 67 66 67 65  Resp: (!) 24 19 (!) 23 20  Temp: 97.7 F (36.5 C)     TempSrc: Oral     SpO2: 99% 98% 97% 98%  Weight:      Height:       General: Not in acute distress HEENT:       Eyes: PERRL, EOMI, no scleral icterus.       ENT: No  discharge from the ears and nose, no pharynx injection, no tonsillar enlargement.        Neck: No JVD, no bruit, no mass felt. Heme: No neck lymph node enlargement. Cardiac: S1/S2, RRR, No murmurs, No gallops or rubs. Respiratory: No rales, wheezing, rhonchi or rubs. GI: Soft, nondistended, nontender, no rebound pain, no organomegaly, BS present. GU: No hematuria Ext: No pitting leg edema bilaterally. 1+DP/PT pulse bilaterally. Musculoskeletal: No joint deformities, No joint redness or warmth, no limitation of ROM in spin. Skin: No rashes.  Neuro: Alert, oriented X3, cranial nerves II-XII grossly intact, moves all extremities normally.  Psych: Patient is not psychotic, no suicidal or hemocidal ideation.  Labs on Admission: I have personally reviewed following labs and imaging studies  CBC: Recent Labs  Lab 11/30/20 0909 11/30/20 1124  WBC 9.2 10.2  NEUTROABS 7.0  --   HGB 10.8* 10.2*  HCT 35.6* 33.2*  MCV 80.7 82.2  PLT 246 0000000   Basic Metabolic Panel: Recent Labs  Lab 11/30/20 0909  NA 139  K 4.7  CL 109  CO2 24  GLUCOSE 125*  BUN 32*  CREATININE 2.26*  CALCIUM 8.5*   GFR: Estimated Creatinine Clearance: 47.3  mL/min (A) (by C-G formula based on SCr of 2.26 mg/dL (H)). Liver Function Tests: Recent Labs  Lab 11/30/20 0909  AST 11*  ALT 9  ALKPHOS 60  BILITOT 1.0  PROT 6.0*  ALBUMIN 3.4*   No results for input(s): LIPASE, AMYLASE in the last 168 hours. No results for input(s): AMMONIA in the last 168 hours. Coagulation Profile: Recent Labs  Lab 11/30/20 0909  INR 1.2   Cardiac Enzymes: No results for input(s): CKTOTAL, CKMB, CKMBINDEX, TROPONINI in the last 168 hours. BNP (last 3 results) No results for input(s): PROBNP in the last 8760 hours. HbA1C: No results for input(s): HGBA1C in the last 72 hours. CBG: No results for input(s): GLUCAP in the last 168 hours. Lipid Profile: No results for input(s): CHOL, HDL, LDLCALC, TRIG, CHOLHDL, LDLDIRECT in the last 72 hours. Thyroid Function Tests: No results for input(s): TSH, T4TOTAL, FREET4, T3FREE, THYROIDAB in the last 72 hours. Anemia Panel: No results for input(s): VITAMINB12, FOLATE, FERRITIN, TIBC, IRON, RETICCTPCT in the last 72 hours. Urine analysis:    Component Value Date/Time   COLORURINE YELLOW (A) 07/20/2020 2343   APPEARANCEUR CLEAR (A) 07/20/2020 2343   APPEARANCEUR Clear 02/26/2014 1126   LABSPEC 1.016 07/20/2020 2343   LABSPEC 1.008 02/26/2014 1126   PHURINE 5.0 07/20/2020 2343   GLUCOSEU NEGATIVE 07/20/2020 2343   GLUCOSEU Negative 02/26/2014 1126   HGBUR NEGATIVE 07/20/2020 2343   BILIRUBINUR NEGATIVE 07/20/2020 2343   BILIRUBINUR Negative 02/26/2014 [place] 07/20/2020 2343   PROTEINUR 100 (A) 07/20/2020 2343   NITRITE NEGATIVE 07/20/2020 2343   LEUKOCYTESUR TRACE (A) 07/20/2020 2343   LEUKOCYTESUR 1+ 02/26/2014 1126   Sepsis Labs: '@LABRCNTIP'$ (procalcitonin:4,lacticidven:4) )No results found for this or any previous visit (from the past 240 hour(s)).   Radiological Exams on Admission: No results found.   EKG: I have personally reviewed.  Sinus rhythm, QTC 498, LAD, poor R wave  progression, PAC  Assessment/Plan Principal Problem:   GI bleeding Active Problems:   Hyperlipidemia   Paroxysmal atrial fibrillation (HCC)   Essential hypertension   Obstructive sleep apnea   Acute blood loss anemia   Stroke (HCC)   CKD (chronic kidney disease), stage IV (HCC)   Chronic systolic CHF (congestive heart failure) (HCC)   Iron deficiency anemia  Hypothyroidism   GI bleeding, acute blood loss anemia and hx of iron deficiency anemia: Hgb 14.3 --> 10.8. pt took his last dose of Eliquis last night.  Currently hemodynamically stable. Dr. Vicente Males is consulted.  - will place in med-surg bed obs - Hold Eliquis - NPO now - IVF: 50 mL/hr of NS - pt is allergic to PPI - Zofran IV for nausea - Avoid NSAIDs and SQ heparin - Maintain IV access (2 large bore IVs if possible). - Monitor closely and follow q6h cbc, transfuse as necessary, if Hgb<7.0 - La[facility]: INR, PTT and type screen - Continue iron supplement  Hyperlipidemia -Lipitor  Paroxysmal atrial fibrillation (HCC) -Hold Eliquis -continue Coreg  Essential hypertension -continue Coreg  -hold home oral Hydralazine  due to risk of hypotension due to GIB  Hypothyroidism -synthroid  Stroke ([place]) -Lipitor -hold Eliquuis  CKD (chronic kidney disease), stage IV ([place]): Stable.  Recent baseline creatinine 2.3-2.6.  His creatinine is at 2.26, BUN 32 today. -f/u with BMP  Chronic systolic CHF (congestive heart failure) ([place]): 2D echo 9/60/22 showed EF of 30 to 35%.  Patient does not have leg edema, no JVD, no shortness of breath.  sCHF seem to be compensated. -continue home lasix -Check BNP   OSA: -CPAP    DVT ppx: SCD Code Status: Full code Family Communication:  Yes, patient's  mother by phone Disposition Plan:  Anticipate discharge back to previous environment Consults called: Dr. Saunders Revel of GI Admission status and Level of care: Med-Surg:   for obs     Status is: Observation  The patient remains OBS  appropriate and will d/c before 2 midnights.  Dispo: The patient is from: Home              Anticipated d/c is to: Home              Patient currently is not medically stable to d/c.   Difficult to place patient No            Date of Service 11/30/2020    Ivor Costa [facility]   If 7PM-7AM, please contact night-coverage [email or web address] 11/30/2020, 11:57 AM

## 2020-11-30 NOTE — Consult Note (Signed)
Jonathon Bellows , MD [address], [place], [place], [place], [address] [id number] [place], [place], [place], [address] Phone: [phone]  Fax: [phone]  Consultation  Referring Provider:     ER Primary Care Physician:  [name], MD Primary Gastroenterologist:     Dr Alice Reichert       Reason for Consultation:     GI bleed  Date of Admission:  11/30/2020 Date of Consultation:  11/30/2020         HPI:   Hector [name] is a 55 y.o. male who has previously been seen by Dr. Alice Reichert back in May 2022 when he presented with a lower GI bleed and a 5 g drop in hemoglobin.  At that time he was on Eliquis which had not taken for several months for paroxysmal atrial fibrillation, CKD and OSA.  Underwent a colonoscopy on 07/22/2020 diverticulosis of the entire colon with no active bleeding.  Grade 1 internal hemorrhoids were noted.  During the hospitalization he had a tagged RBC scan which showed bleeding within the rectum. Single he was hospitalized in 2017 for GI bleeding underwent sigmoidoscopy with nonbleeding internal hemorrhoids noted prior endoscopy in July 2015 showed hiatal hernia.  He presented to the ER today for an episode of bowel movement mixed with bright red blood.  Baseline hemoglobin is around 11 g.  On admission was 10.8 g MCV of 80.7.  No elevation of the BUN/creatinine ratio.Had two further episodes of blood mixed with stools since coming into the hospital , the qty has been decreasing each time. No abdominal pain. 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 12/2007 MV: EF 28%, no isch;  b. 8/12 Echo: EF 25-35%; c. 02/2014 Echo: EF 20-25%; d. 12/2014 Cath: minimal CAD; e. 01/2015 Echo: EF 50-55%;  d. 05/2016 Echo: EF 30-35%, diff HK, gr2 DD; e. 11/2016 Echo: EF 45-50%, diff HK.   Chronic combined systolic (congestive) and diastolic (congestive) heart failure ([place])     a. 05/2016 Most recent Echo: EF 30-35%, diff HK, Gr2 DD, mild MR, mod dil LA; b. 11/2016 Echo: EF 45-50%, diff HK.   CKD (chronic kidney disease), stage III (HCC)    Elevated troponin    Chronically elevated. - level was 0.17-0.10 during recent admission   Essential hypertension    GI bleed 11/2013   Hyperlipidemia    Pancreatitis    Paroxysmal A-fib (HCC)    a. new onset s/p unsuccessful TEE/DCCV on 08/16/2014; b. on amio/eliquis (CHA2DS2VASc = 2-3); c. reports 1-2 hrs of afib ~ q68mo.   [place]    a. new onset 07/2014; b. s/p unsuccessful TEE/DCCV 08/16/2014; c. on apixaban.   Sleep-disordered breathing    Has yet to have a sleep study   Stroke ([facility]     Past Surgical History:  Procedure Laterality Date   CARDIAC CATHETERIZATION N/A 01/09/2015   Procedure: Left Heart Cath and Coronary Angiography;  Surgeon: DLeonie Man MD;  [facility];  Service: Cardiovascular;  Laterality: N/A;   COLONOSCOPY N/A 07/22/2020   Procedure: COLONOSCOPY;  Surgeon: Toledo, TBenay Pike MD;  Location: [facility] ENDOSCOPY;  Service: Gastroenterology;  Laterality: N/A;   ELECTROPHYSIOLOGIC STUDY N/A 08/16/2014   Procedure: CARDIOVERSION;  Surgeon: TMinna Merritts MD;  Location: [facility] ORS;  Service: Cardiovascular;  Laterality: N/A;   FLEXIBLE SIGMOIDOSCOPY  N/A 10/10/2015   Procedure: FLEXIBLE SIGMOIDOSCOPY;  Surgeon: [name], MD;  Location: [facility] ENDOSCOPY;  Service: Endoscopy;  Laterality: N/A;   KNEE SURGERY Right    NM MYOVIEW LTD  November 2011   No ischemia or infarction. EF 50-55% ( no improvement from 2009 Myoview EF of 28%   TEE WITHOUT CARDIOVERSION N/A 08/16/2014   Procedure: TRANSESOPHAGEAL ECHOCARDIOGRAM (TEE);  Surgeon: [name], MD;  Location: [facility] ORS;  Service: Cardiovascular;  Laterality: N/A;    Prior to Admission medications   Medication Sig Start Date End Date Taking? Authorizing Provider  allopurinol (ZYLOPRIM) 100 MG tablet Take  100 mg by mouth daily.   Yes [provider]  apixaban (ELIQUIS) 5 MG TABS tablet Take 1 tablet (5 mg total) by mouth 2 (two) times daily. 10/08/20 11/30/20 Yes Gollan, Kathlene November, MD  atorvastatin (LIPITOR) 80 MG tablet Take 1 tablet (80 mg total) by mouth daily. 10/08/20  Yes Marrianne Mood D, PA-C  carvedilol (COREG) 6.25 MG tablet Take 1 tablet (6.25 mg total) by mouth 2 (two) times daily with a meal. 10/08/20 04/06/21 Yes Visser, Jacquelyn D, PA-C  ferrous gluconate (FERGON) 324 MG tablet Take 324 mg by mouth daily with breakfast.   Yes [provider]  furosemide (LASIX) 40 MG tablet Take 1 tablet (40 mg total) by mouth daily. 10/08/20 02/05/21 Yes Visser, Jacquelyn D, PA-C  hydrALAZINE (APRESOLINE) 25 MG tablet Take 1 tablet (25 mg total) by mouth 3 (three) times daily. 10/08/20 04/06/21 Yes Visser, Jacquelyn D, PA-C  levothyroxine (SYNTHROID) 100 MCG tablet Take 100 mcg by mouth daily before breakfast.   Yes [provider]    Family History  Problem Relation Age of Onset   Hypertension Mother    Hyperlipidemia Mother    Diabetes Mother      Social History   Tobacco Use   Smoking status: Former    Packs/day: 1.00    Years: 12.00    Pack years: 12.00    Types: Cigarettes    Quit date: 02/23/1998    Years since quitting: 22.7   Smokeless tobacco: Never  Vaping Use   Vaping Use: Never used  Substance Use Topics   Alcohol use: Not Currently    Alcohol/week: 0.0 standard drinks    Comment: Past heavy drinker   Drug use: No    Allergies as of 11/30/2020 - Review Complete 11/30/2020  Allergen Reaction Noted   Esomeprazole magnesium Other (See Comments) 01/18/2017   Eggs or egg-derived products Rash 03/28/2014    Review of Systems:    All systems reviewed and negative except where noted in HPI.   Physical Exam:  Vital signs in last 24 hours: Temp:  [97.7 F (36.5 C)] 97.7 F (36.5 C) (10/08 0850) Pulse Rate:  [65-67] 65 (10/08 1000) Resp:   [19-24] 20 (10/08 1000) BP: (145-168)/(103-116) 168/116 (10/08 1000) SpO2:  [97 %-99 %] 98 % (10/08 1000) Weight:  [116.1 kg] 116.1 kg (10/08 0849)   General:   Pleasant, cooperative in NAD Head:  Normocephalic and atraumatic. Eyes:   No icterus.   Conjunctiva pink. PERRLA. Ears:  Normal auditory acuity. Neck:  Supple; no masses or thyroidomegaly Lungs: Respirations even and unlabored. Lungs clear to auscultation bilaterally.   No wheezes, crackles, or rhonchi.  Heart:  Regular rate and rhythm;  Without murmur, clicks, rubs or gallops Abdomen:  Soft, nondistended, nontender. Normal bowel sounds. No appreciable masses or hepatomegaly.  No rebound or guarding.  Neurologic:  Alert  and oriented x3;  grossly normal neurologically. Psych:  Alert and cooperative. Normal affect.  LAB RESULTS: Recent Labs    11/30/20 0909  WBC 9.2  HGB 10.8*  HCT 35.6*  PLT 246   BMET Recent Labs    11/30/20 0909  NA 139  K 4.7  CL 109  CO2 24  GLUCOSE 125*  BUN 32*  CREATININE 2.26*  CALCIUM 8.5*   LFT Recent Labs    11/30/20 0909  PROT 6.0*  ALBUMIN 3.4*  AST 11*  ALT 9  ALKPHOS 60  BILITOT 1.0   PT/INR Recent Labs    11/30/20 0909  LABPROT 15.4*  INR 1.2    STUDIES: No results found.    Impression / Plan:   Hector [name] is a 55 y.o. y/o male with recent history of lower GI bleed presumed to be diverticular in nature in May 2022 evaluated by Dr. Alice Reichert with a colonoscopy that showed large colonic intestinal active bleeding.  Grade 1 internal hemorrhoids were noted.  Comes in again today with an episode of rectal bleeding ,hemoglobin is very close to his baseline at 10.8 g.  Likely had bleeding from internal hemorrhoids  vs diverticular bleed  Plan 1.  Monitor CBC and transfuse as needed.  If there is further bleeding consider tagged RBC scan or CT angiogram.  Do not see a reason to repeat colonoscopy images had one 5 months back and noted to have diverticulosis of the  colon  2.  On Eliquis last dose taken last night..   3. As an outpatient to follow up with GI  , if has features suggestive of hemorrhoidal bleeding then may benefit from banding.    Thank you for involving me in the care of this patient.      LOS: 0 days   Jonathon Bellows, MD  11/30/2020, 11:30 AM

## 2020-11-30 NOTE — ED Provider Notes (Addendum)
[facility] Emergency Department Provider Note ____________________________________________   Event Date/Time   First MD Initiated Contact with Patient 11/30/20 [phone]     (approximate)  I have reviewed the triage vital signs and the nursing notes.   HISTORY  Chief Complaint Hemorrhoids    HPI Hector [name] is a 55 y.o. male with PMH as noted below including prior lower GI bleed who presents with rectal bleeding, acute onset this morning, 1 episode of a bowel movement mixed with bright red blood.  The patient reports some "grumbling" in his abdomen but no abdominal pain.  He denies any dizziness or lightheadedness but states that he has had to be transfused in the past for this.  He has no vomiting.  He denies any other abnormal bleeding or bruising.   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 12/2007 MV: EF 28%, no isch;  b. 8/12 Echo: EF 25-35%; c. 02/2014 Echo: EF 20-25%; d. 12/2014 Cath: minimal CAD; e. 01/2015 Echo: EF 50-55%;  d. 05/2016 Echo: EF 30-35%, diff HK, gr2 DD; e. 11/2016 Echo: EF 45-50%, diff HK.   Chronic combined systolic (congestive) and diastolic (congestive) heart failure ([place])    a. 05/2016 Most recent Echo: EF 30-35%, diff HK, Gr2 DD, mild MR, mod dil LA; b. 11/2016 Echo: EF 45-50%, diff HK.   CKD (chronic kidney disease), stage III (HCC)    Elevated troponin    Chronically elevated. - level was 0.17-0.10 during recent admission   Essential hypertension    GI bleed 11/2013   Hyperlipidemia    Pancreatitis    Paroxysmal A-fib (HCC)    a. new onset s/p unsuccessful TEE/DCCV on 08/16/2014; b. on amio/eliquis (CHA2DS2VASc = 2-3); c. reports 1-2 hrs of afib ~ q47mo.   Paroxysmal atrial flutter ([facility]    a. new onset 07/2014; b. s/p unsuccessful TEE/DCCV 08/16/2014; c. on apixaban.   Sleep-disordered breathing    Has yet to have a sleep study   Stroke ([facility]     Patient Active  Problem List   Diagnosis Date Noted   GI bleeding 11/30/2020   Stroke ([facility]    Alcohol abuse    CKD (chronic kidney disease), stage IV (HCC)    Chronic systolic CHF (congestive heart failure) (HCC)    Iron deficiency anemia    Congestive heart failure (CHF) ([facility] 09/12/2020   CKD (chronic kidney disease) stage 4, GFR 15-29 ml/min ([place] 07/21/2020   GIB (gastrointestinal bleeding) 07/21/2020   Acute blood loss anemia 07/21/2020   Hyperthyroidism without thyroid nodule 02/03/2020   Acute respiratory distress syndrome (ARDS) due to COVID-19 virus ([facility] 12/31/2019   Acute renal failure superimposed on stage 3b chronic kidney disease ([facility] 12/31/2019   Hyperkalemia 12/31/2019   Hyponatremia 12/31/2019   Obstructive sleep apnea 04/21/2016   Nonischemic cardiomyopathy ([facility] 05/26/2015   Chronic combined systolic (congestive) and diastolic (congestive) heart failure ([place] 04/01/2015   Essential hypertension 04/01/2015   Sinus bradycardia 01/29/2015   Paroxysmal atrial fibrillation ([facility] 08/16/2014   Paroxysmal atrial flutter ([facility] 08/14/2014    Class: Acute   Chronic kidney disease (CKD), stage III (moderate) ([place] 07/02/2014   Hypertensive heart disease    Hyperlipidemia     Past Surgical History:  Procedure Laterality Date   CARDIAC CATHETERIZATION N/A 01/09/2015   Procedure: Left Heart Cath and Coronary Angiography;  Surgeon: DLeonie Man MD;  [facility];  Service:  Cardiovascular;  Laterality: N/A;   COLONOSCOPY N/A 07/22/2020   Procedure: COLONOSCOPY;  Surgeon: Toledo, Benay Pike, MD;  Location: [facility] ENDOSCOPY;  Service: Gastroenterology;  Laterality: N/A;   ELECTROPHYSIOLOGIC STUDY N/A 08/16/2014   Procedure: CARDIOVERSION;  Surgeon: [name], MD;  Location: [facility] ORS;  Service: Cardiovascular;  Laterality: N/A;   FLEXIBLE SIGMOIDOSCOPY N/A 10/10/2015   Procedure: FLEXIBLE SIGMOIDOSCOPY;  Surgeon: [name], MD;  Location: [facility] ENDOSCOPY;   Service: Endoscopy;  Laterality: N/A;   KNEE SURGERY Right    NM MYOVIEW LTD  November 2011   No ischemia or infarction. EF 50-55% ( no improvement from 2009 Myoview EF of 28%   TEE WITHOUT CARDIOVERSION N/A 08/16/2014   Procedure: TRANSESOPHAGEAL ECHOCARDIOGRAM (TEE);  Surgeon: [name], MD;  Location: [facility] ORS;  Service: Cardiovascular;  Laterality: N/A;    Prior to Admission medications   Medication Sig Start Date End Date Taking? Authorizing Provider  allopurinol (ZYLOPRIM) 100 MG tablet Take 100 mg by mouth daily.   Yes [provider]  apixaban (ELIQUIS) 5 MG TABS tablet Take 1 tablet (5 mg total) by mouth 2 (two) times daily. 10/08/20 11/30/20 Yes Gollan, Kathlene November, MD  atorvastatin (LIPITOR) 80 MG tablet Take 1 tablet (80 mg total) by mouth daily. 10/08/20  Yes Marrianne Mood D, PA-C  carvedilol (COREG) 6.25 MG tablet Take 1 tablet (6.25 mg total) by mouth 2 (two) times daily with a meal. 10/08/20 04/06/21 Yes Visser, Jacquelyn D, PA-C  ferrous gluconate (FERGON) 324 MG tablet Take 324 mg by mouth daily with breakfast.   Yes [provider]  furosemide (LASIX) 40 MG tablet Take 1 tablet (40 mg total) by mouth daily. 10/08/20 02/05/21 Yes Visser, Jacquelyn D, PA-C  hydrALAZINE (APRESOLINE) 25 MG tablet Take 1 tablet (25 mg total) by mouth 3 (three) times daily. 10/08/20 04/06/21 Yes Visser, Jacquelyn D, PA-C  levothyroxine (SYNTHROID) 100 MCG tablet Take 100 mcg by mouth daily before breakfast.   Yes [provider]    Allergies Esomeprazole magnesium and Eggs or egg-derived products  Family History  Problem Relation Age of Onset   Hypertension Mother    Hyperlipidemia Mother    Diabetes Mother     Social History Social History   Tobacco Use   Smoking status: Former    Packs/day: 1.00    Years: 12.00    Pack years: 12.00    Types: Cigarettes    Quit date: 02/23/1998    Years since quitting: 22.7   Smokeless tobacco: Never  Vaping Use    Vaping Use: Never used  Substance Use Topics   Alcohol use: Not Currently    Alcohol/week: 0.0 standard drinks    Comment: Past heavy drinker   Drug use: No    Review of Systems  Constitutional: No fever/chills Eyes: No visual changes. ENT: No sore throat. Cardiovascular: Denies chest pain. Respiratory: Denies shortness of breath. Gastrointestinal: No vomiting or diarrhea.  Genitourinary: Negative for dysuria or hematuria.  Musculoskeletal: Negative for back pain. Skin: Negative for rash. Neurological: Negative for headache.   ____________________________________________   PHYSICAL EXAM:  VITAL SIGNS: ED Triage Vitals  Enc Vitals Group     BP 11/30/20 0850 (!) 162/115     Pulse Rate 11/30/20 0850 67     Resp 11/30/20 0850 (!) 24     Temp 11/30/20 0850 97.7 F (36.5 C)     Temp Source 11/30/20 0850 Oral     SpO2 11/30/20 0850 99 %  Weight 11/30/20 0849 256 lb (116.1 kg)     Height 11/30/20 0849 '5\' 11"'$  (1.803 m)     Head Circumference --      Peak Flow --      Pain Score 11/30/20 0849 6     Pain Loc --      Pain Edu? --      Excl. in [place]? --     Constitutional: Alert and oriented. Well appearing and in no acute distress. Eyes: Conjunctivae are normal.  Head: Atraumatic. Nose: No congestion/rhinnorhea. Mouth/Throat: Mucous membranes are moist.   Neck: Normal range of motion.  Cardiovascular: Normal rate, regular rhythm. Good peripheral circulation. Respiratory: Normal respiratory effort.  No retractions.  Gastrointestinal: Soft and nontender. No distention.  No visible external hemorrhoid or active bleeding on rectal exam. Genitourinary: No flank tenderness. Musculoskeletal: Extremities warm and well perfused.  Neurologic:  Normal speech and language. No gross focal neurologic deficits are appreciated.  Skin:  Skin is warm and dry. No rash noted. Psychiatric: Mood and affect are normal. Speech and behavior are  normal.  ____________________________________________   LABS (all labs ordered are listed, but only abnormal results are displayed)  Labs Reviewed  COMPREHENSIVE METABOLIC PANEL - Abnormal; Notable for the following components:      Result Value   Glucose, Bld 125 (*)    BUN 32 (*)    Creatinine, Ser 2.26 (*)    Calcium 8.5 (*)    Total Protein 6.0 (*)    Albumin 3.4 (*)    AST 11 (*)    GFR, Estimated 33 (*)    All other components within normal limits  CBC WITH DIFFERENTIAL/PLATELET - Abnormal; Notable for the following components:   Hemoglobin 10.8 (*)    HCT 35.6 (*)    MCH 24.5 (*)    RDW 23.0 (*)    Abs Immature Granulocytes 0.09 (*)    All other components within normal limits  PROTIME-INR - Abnormal; Notable for the following components:   Prothrombin Time 15.4 (*)    All other components within normal limits  RESP PANEL BY RT-PCR (FLU A&B, COVID) ARPGX2  CBC  CBC  CBC  BRAIN NATRIURETIC PEPTIDE  APTT  TYPE AND SCREEN   ____________________________________________  EKG  ED ECG REPORT I, Arta Silence, the attending physician, personally viewed and interpreted this ECG.  Date: 11/30/2020 EKG Time: 0852 Rate: 66 Rhythm: normal sinus rhythm QRS Axis: normal Intervals: Incomplete LBBB ST/T Wave abnormalities: normal Narrative Interpretation: no evidence of acute ischemia   ____________________________________________  RADIOLOGY    ____________________________________________   PROCEDURES  Procedure(s) performed: No  Procedures  Critical Care performed: No ____________________________________________   INITIAL IMPRESSION / ASSESSMENT AND PLAN / ED COURSE  Pertinent labs & imaging results that were available during my care of the patient were reviewed by me and considered in my medical decision making (see chart for details).   55 year old male with PMH as noted above presents with bloody stool this morning with no significant  associated symptoms.  I reviewed the past medical records in [facility].  The patient was admitted at the end of May with lower GI bleed.  He required transfusion of several units of blood.  Colonoscopy at that time did not reveal a source of the bleed, but it resolved spontaneously.    On exam today the patient is well-appearing.  He is hypertensive with otherwise normal vital signs.  The abdomen is soft and nontender.  Rectal exam does not reveal any  external hemorrhoids or fissure.  Presentation is concerning for recurrent lower GI bleed.  We will obtain labs and reassess.  ----------------------------------------- 11:22 AM on 11/30/2020 -----------------------------------------  Lab work-up reveals a significant drop in hemoglobin compared to several weeks ago, although he does not need a transfusion at this time.  The patient had another grossly bloody bowel movement in the ED although he remains hemodynamically stable.  I consulted Dr. Vicente Males from gastroenterology, and I consulted Dr. Blaine Hamper from the hospitalist service for admission.  ____________________________________________   FINAL CLINICAL IMPRESSION(S) / ED DIAGNOSES  Final diagnoses:  Lower GI bleed      NEW MEDICATIONS STARTED DURING THIS VISIT:  New Prescriptions   No medications on file     Note:  This document was prepared using Dragon voice recognition software and may include unintentional dictation errors.    Arta Silence, MD 11/30/20 1123    Arta Silence, MD 11/30/20 1147

## 2020-11-30 NOTE — Progress Notes (Signed)
Cpap declined by pt.  States he has not been using at home because his was damaged & needs to be replaced.

## 2020-11-30 NOTE — ED Notes (Signed)
Informed RN bed assigned 

## 2020-11-30 NOTE — ED Notes (Signed)
Pt has a hx of hypertension and has not taken his meds today

## 2020-12-01 DIAGNOSIS — I5022 Chronic systolic (congestive) heart failure: Secondary | ICD-10-CM | POA: Diagnosis not present

## 2020-12-01 DIAGNOSIS — K922 Gastrointestinal hemorrhage, unspecified: Secondary | ICD-10-CM | POA: Diagnosis not present

## 2020-12-01 DIAGNOSIS — D62 Acute posthemorrhagic anemia: Secondary | ICD-10-CM | POA: Diagnosis not present

## 2020-12-01 DIAGNOSIS — N184 Chronic kidney disease, stage 4 (severe): Secondary | ICD-10-CM | POA: Diagnosis not present

## 2020-12-01 LAB — BASIC METABOLIC PANEL
Anion gap: 6 (ref 5–15)
BUN: 31 mg/dL — ABNORMAL HIGH (ref 6–20)
CO2: 25 mmol/L (ref 22–32)
Calcium: 8.3 mg/dL — ABNORMAL LOW (ref 8.9–10.3)
Chloride: 107 mmol/L (ref 98–111)
Creatinine, Ser: 2.2 mg/dL — ABNORMAL HIGH (ref 0.61–1.24)
GFR, Estimated: 34 mL/min — ABNORMAL LOW (ref >60)
Glucose, Bld: 86 mg/dL (ref 70–99)
Potassium: 4.1 mmol/L (ref 3.5–5.1)
Sodium: 138 mmol/L (ref 135–145)

## 2020-12-01 LAB — CBC
HCT: 30.3 % — ABNORMAL LOW (ref 39.0–52.0)
HCT: 32 % — ABNORMAL LOW (ref 39.0–52.0)
Hemoglobin: 9.5 g/dL — ABNORMAL LOW (ref 13.0–17.0)
Hemoglobin: 9.9 g/dL — ABNORMAL LOW (ref 13.0–17.0)
MCH: 25.5 pg — ABNORMAL LOW (ref 26.0–34.0)
MCH: 25 pg — ABNORMAL LOW (ref 26.0–34.0)
MCHC: 31.4 g/dL (ref 30.0–36.0)
MCHC: 30.9 g/dL (ref 30.0–36.0)
MCV: 81.5 fL (ref 80.0–100.0)
MCV: 80.8 fL (ref 80.0–100.0)
Platelets: 230 10*3/uL (ref 150–400)
Platelets: 237 10*3/uL (ref 150–400)
RBC: 3.72 MIL/uL — ABNORMAL LOW (ref 4.22–5.81)
RBC: 3.96 MIL/uL — ABNORMAL LOW (ref 4.22–5.81)
RDW: 22.8 % — ABNORMAL HIGH (ref 11.5–15.5)
RDW: 22.8 % — ABNORMAL HIGH (ref 11.5–15.5)
WBC: 10.6 10*3/uL — ABNORMAL HIGH (ref 4.0–10.5)
WBC: 9.2 10*3/uL (ref 4.0–10.5)
nRBC: 0 % (ref 0.0–0.2)
nRBC: 0 % (ref 0.0–0.2)

## 2020-12-01 NOTE — Progress Notes (Signed)
Hector [name] , MD [address] [place], [place], [place], [address] [address], [place], [place], [place], [address] Phone: [phone]  Fax: [phone]   Hector [name] is being followed for rectal bleeding  Day 1 of follow up   Subjective: No further bleeding .    Objective: Vital signs in last 24 hours: Vitals:   11/30/20 2055 12/01/20 0500 12/01/20 0516 12/01/20 0831  BP: (!) 144/89  (!) 126/92 (!) 139/103  Pulse: 61  75 66  Resp: '20  18 14  '$ Temp: 99 F (37.2 C)  99 F (37.2 C) 98.4 F (36.9 C)  TempSrc: Oral  Oral Oral  SpO2: 98%  98% 99%  Weight:  110.6 kg    Height:       Weight change:   Intake/Output Summary (Last 24 hours) at 12/01/2020 0854 Last data filed at 12/01/2020 [id number] Gross per 24 hour  Intake 561.67 ml  Output 1501 ml  Net -939.33 ml     Exam: Heart:: Regular rate and rhythm, S1S2 present, or without murmur or extra heart sounds Lungs: normal and clear to auscultation Abdomen: soft, nontender, normal bowel sounds   Lab Results: '@LABTEST2'$ @ Micro Results: Recent Results (from the past 240 hour(s))  Resp Panel by RT-PCR (Flu A&B, Covid) Nasopharyngeal Swab     Status: None   Collection Time: 11/30/20 11:24 AM   Specimen: Nasopharyngeal Swab; Nasopharyngeal(NP) swabs in vial transport medium  Result Value Ref Range Status   SARS Coronavirus 2 by RT PCR NEGATIVE NEGATIVE Final    Comment: (NOTE) SARS-CoV-2 target nucleic acids are NOT DETECTED.  The SARS-CoV-2 RNA is generally detectable in upper respiratory specimens during the acute phase of infection. The lowest concentration of SARS-CoV-2 viral copies this assay can detect is 138 copies/mL. A negative result does not preclude SARS-Cov-2 infection and should not be used as the sole basis for treatment or other patient management decisions. A negative result may occur with  improper specimen collection/handling, submission of specimen other than nasopharyngeal swab,  presence of viral mutation(s) within the areas targeted by this assay, and inadequate number of viral copies(<138 copies/mL). A negative result must be combined with clinical observations, patient history, and epidemiological information. The expected result is Negative.  Fact Sheet for Patients:  EntrepreneurPulse.com.au  Fact Sheet for Healthcare Providers:  IncredibleEmployment.be  This test is no t yet approved or cleared by the [place] FDA and  has been authorized for detection and/or diagnosis of SARS-CoV-2 by FDA under an Emergency Use Authorization (EUA). This EUA will remain  in effect (meaning this test can be used) for the duration of the COVID-19 declaration under Section 564(b)(1) of the Act, 21 U.S.C.section 360bbb-3(b)(1), unless the authorization is terminated  or revoked sooner.       Influenza A by PCR NEGATIVE NEGATIVE Final   Influenza B by PCR NEGATIVE NEGATIVE Final    Comment: (NOTE) The Xpert Xpress SARS-CoV-2/FLU/RSV plus assay is intended as an aid in the diagnosis of influenza from Nasopharyngeal swab specimens and should not be used as a sole basis for treatment. Nasal washings and aspirates are unacceptable for Xpert Xpress SARS-CoV-2/FLU/RSV testing.  Fact Sheet for Patients: EntrepreneurPulse.com.au  Fact Sheet for Healthcare Providers: IncredibleEmployment.be  This test is not yet approved or cleared by the [place] FDA and has been authorized for detection and/or diagnosis of SARS-CoV-2 by FDA under an Emergency Use Authorization (EUA). This EUA will remain in effect (meaning this test can be used)  for the duration of the COVID-19 declaration under Section 564(b)(1) of the Act, 21 U.S.C. section 360bbb-3(b)(1), unless the authorization is terminated or revoked.  Performed at [facility], [address], [place], [place] [address]     Studies/Results: No results found. Medications: I have reviewed the patient's current medications. Scheduled Meds:  allopurinol  100 mg Oral Daily   atorvastatin  80 mg Oral Daily   carvedilol  6.25 mg Oral BID WC   ferrous gluconate  324 mg Oral Q breakfast   furosemide  40 mg Oral Daily   levothyroxine  100 mcg Oral QAC breakfast   Continuous Infusions: PRN Meds:.acetaminophen, hydrALAZINE, ondansetron (ZOFRAN) IV   Assessment: Principal Problem:   GI bleeding Active Problems:   Hyperlipidemia   Paroxysmal atrial fibrillation (HCC)   Essential hypertension   Obstructive sleep apnea   Acute blood loss anemia   Stroke (HCC)   CKD (chronic kidney disease), stage IV (HCC)   Chronic systolic CHF (congestive heart failure) (HCC)   Iron deficiency anemia   Hypothyroidism   Hector [name] is a 55 y.o. y/o male with recent history of lower GI bleed presumed to be diverticular in nature in May 2022 evaluated by Dr. Alice Reichert with a colonoscopy that showed large colonic intestinal active bleeding.  Grade 1 internal hemorrhoids were noted.  Comes in again today with an episode of rectal bleeding ,hemoglobin is very close to his baseline at 10.8 g.  Likely had bleeding from internal hemorrhoids  vs diverticular bleed. Hb stable since yesterday with nofurther bleeding reported.    Plan 1.  Monitor CBC and transfuse as needed.  If there is further bleeding consider tagged RBC scan or CT angiogram.  Do not see a reason to repeat colonoscopy images had one 5 months back and noted to have diverticulosis of the colon   2.  On Eliquis last dose taken day before last night..    3. As an outpatient to follow up with GI  , if has features suggestive of hemorrhoidal bleeding then may benefit from banding.    I will sign off.  Please call me if any further GI concerns or questions.  We would like to thank you for the opportunity to participate in the care of Hector [name].    LOS: 0  days   Hector Bellows, MD 12/01/2020, 8:54 AM

## 2020-12-01 NOTE — Discharge Summary (Signed)
Physician Discharge Summary  Hector [name] [id number] DOB: 1965/12/03 DOA: 11/30/2020  PCP: [name], MD  Admit date: 11/30/2020 Discharge date: 12/01/2020  Admitted From: Home Disposition: Home  Recommendations for Outpatient Follow-up:  Follow up with PCP in 1-2 weeks Follow-up with gastroenterology in 1 to 2 weeks Please obtain BMP/CBC in one week Please follow up on the following pending results: None  Home Health: No Equipment/Devices: None Discharge Condition: Stable CODE STATUS: Full Diet recommendation: Heart Healthy   Brief/Interim Summary:  Hector [name] is a 55 y.o. male with medical history significant of A. fib on Eliquis, pancreatitis, sCHF with EF 30-35%, CKD-IV, HTN, HLD, stroke, hypothyroidism, alcohol abuse in remission for 8 years, GI bleeding, OSA, anemia, COVID infection 06/19/2020, who presents with rectal bleeding.   Patient states that he has 2 episode of rectal bleeding, one episode at home and another episode in emergency room, with bright red blood.  Patient does not have nausea or vomiting.  Patient states that he has mild lower abdominal discomfort. His last dose of Eliquis was night prior to the presentation.  Patient had history of rectal bleeding which required blood transfusion, and had colonoscopy which showed diverticulosis and internal hemorrhoids without active bleeding 07/22/2020.  During that hospitalization intact red blood cell scan was obtained and it showed some bleeding within the rectum. He had another hospitalizations for GI bleeding in 2017.  Patient did not had any more episodes of bleeding.  Able to tolerate diet well.  GI was consulted and they do not think that another colonoscopy will give him any benefit.  They were suggesting outpatient GI follow-up for possible banding of internal hemorrhoids.  Patient also need to discuss with his cardiologist regarding risk and benefits of continuation of Eliquis as he had  multiple GI bleeds. Patient was advised to hold Eliquis for another 3 to 4 days and then resume it.  Patient is taking Eliquis to prevent stroke with history of paroxysmal atrial fibrillation.  He also has an history of prior stroke.  His renal functions remained stable with history of CKD stage IV and there was no increasing BUN with this concern of GI bleed.  He will continue with rest of his home medications and follow-up with his providers.  Discharge Diagnoses:  Principal Problem:   GI bleeding Active Problems:   Hyperlipidemia   Paroxysmal atrial fibrillation (HCC)   Essential hypertension   Obstructive sleep apnea   Acute blood loss anemia   Stroke (HCC)   CKD (chronic kidney disease), stage IV (HCC)   Chronic systolic CHF (congestive heart failure) (HCC)   Iron deficiency anemia   Hypothyroidism   Discharge Instructions  Discharge Instructions     Diet - low sodium heart healthy   Complete by: As directed    Discharge instructions   Complete by: As directed    It was pleasure taking care of you. Please hold your Eliquis for the next 3 to 4 days and then restart it. Please follow-up with your cardiologist in order to discuss risk and benefits of Eliquis as you were having repeated GI bleeding. It is important that you avoid constipation and follow-up closely with your gastroenterologist as you might required banding of your internal hemorrhoids. Please seek medical attention if you noticed more bleeding.   Increase activity slowly   Complete by: As directed       Allergies as of 12/01/2020       Reactions   Esomeprazole Magnesium Other (See  Comments)   Suspected interstitial nephritis 2018   Eggs Or Egg-derived Products Rash        Medication List     TAKE these medications    allopurinol 100 MG tablet Commonly known as: ZYLOPRIM Take 100 mg by mouth daily.   apixaban 5 MG Tabs tablet Commonly known as: ELIQUIS Take 1 tablet (5 mg total) by mouth 2  (two) times daily.   atorvastatin 80 MG tablet Commonly known as: LIPITOR Take 1 tablet (80 mg total) by mouth daily.   carvedilol 6.25 MG tablet Commonly known as: COREG Take 1 tablet (6.25 mg total) by mouth 2 (two) times daily with a meal.   ferrous gluconate 324 MG tablet Commonly known as: FERGON Take 324 mg by mouth daily with breakfast.   furosemide 40 MG tablet Commonly known as: LASIX Take 1 tablet (40 mg total) by mouth daily.   hydrALAZINE 25 MG tablet Commonly known as: APRESOLINE Take 1 tablet (25 mg total) by mouth 3 (three) times daily.   levothyroxine 100 MCG tablet Commonly known as: SYNTHROID Take 100 mcg by mouth daily before breakfast.        Follow-up Information     Bender, Durene Cal, MD. Schedule an appointment as soon as possible for a visit in 1 week(s).   Specialty: Family Medicine Contact information: [place] [id number] [phone]         [name], MD .   Specialty: Cardiology Contact information: [address] [place] [address] [phone]                Allergies  Allergen Reactions   Esomeprazole Magnesium Other (See Comments)    Suspected interstitial nephritis 2018   Eggs Or Egg-Derived Products Rash    Consultations: GI  Procedures/Studies: No results found.  Subjective: Patient was seen and examined today.  Denies any pain or bleeding.  Did not had any more bowel movements.  Last episode was yesterday.  Denies any prior constipation. We discussed about holding Eliquis for few days and having a discussion with his cardiologist to discuss risk versus benefit as he had another episode of GI bleed.  Discharge Exam: Vitals:   12/01/20 0516 12/01/20 0831  BP: (!) 126/92 (!) 139/103  Pulse: 75 66  Resp: 18 14  Temp: 99 F (37.2 C) 98.4 F (36.9 C)  SpO2: 98% 99%   Vitals:   11/30/20 2055 12/01/20 0500 12/01/20 0516 12/01/20 0831  BP: (!)  144/89  (!) 126/92 (!) 139/103  Pulse: 61  75 66  Resp: '20  18 14  '$ Temp: 99 F (37.2 C)  99 F (37.2 C) 98.4 F (36.9 C)  TempSrc: Oral  Oral Oral  SpO2: 98%  98% 99%  Weight:  110.6 kg    Height:        General: Pt is alert, awake, not in acute distress Cardiovascular: RRR, S1/S2 +, no rubs, no gallops Respiratory: CTA bilaterally, no wheezing, no rhonchi Abdominal: Soft, NT, ND, bowel sounds + Extremities: no edema, no cyanosis   The results of significant diagnostics from this hospitalization (including imaging, microbiology, ancillary and laboratory) are listed below for reference.    Microbiology: Recent Results (from the past 240 hour(s))  Resp Panel by RT-PCR (Flu A&B, Covid) Nasopharyngeal Swab     Status: None   Collection Time: 11/30/20 11:24 AM   Specimen: Nasopharyngeal Swab; Nasopharyngeal(NP) swabs in vial transport medium  Result Value Ref  Range Status   SARS Coronavirus 2 by RT PCR NEGATIVE NEGATIVE Final    Comment: (NOTE) SARS-CoV-2 target nucleic acids are NOT DETECTED.  The SARS-CoV-2 RNA is generally detectable in upper respiratory specimens during the acute phase of infection. The lowest concentration of SARS-CoV-2 viral copies this assay can detect is 138 copies/mL. A negative result does not preclude SARS-Cov-2 infection and should not be used as the sole basis for treatment or other patient management decisions. A negative result may occur with  improper specimen collection/handling, submission of specimen other than nasopharyngeal swab, presence of viral mutation(s) within the areas targeted by this assay, and inadequate number of viral copies(<138 copies/mL). A negative result must be combined with clinical observations, patient history, and epidemiological information. The expected result is Negative.  Fact Sheet for Patients:  EntrepreneurPulse.com.au  Fact Sheet for Healthcare Providers:   IncredibleEmployment.be  This test is no t yet approved or cleared by the [place] FDA and  has been authorized for detection and/or diagnosis of SARS-CoV-2 by FDA under an Emergency Use Authorization (EUA). This EUA will remain  in effect (meaning this test can be used) for the duration of the COVID-19 declaration under Section 564(b)(1) of the Act, 21 U.S.C.section 360bbb-3(b)(1), unless the authorization is terminated  or revoked sooner.       Influenza A by PCR NEGATIVE NEGATIVE Final   Influenza B by PCR NEGATIVE NEGATIVE Final    Comment: (NOTE) The Xpert Xpress SARS-CoV-2/FLU/RSV plus assay is intended as an aid in the diagnosis of influenza from Nasopharyngeal swab specimens and should not be used as a sole basis for treatment. Nasal washings and aspirates are unacceptable for Xpert Xpress SARS-CoV-2/FLU/RSV testing.  Fact Sheet for Patients: EntrepreneurPulse.com.au  Fact Sheet for Healthcare Providers: IncredibleEmployment.be  This test is not yet approved or cleared by the [place] FDA and has been authorized for detection and/or diagnosis of SARS-CoV-2 by FDA under an Emergency Use Authorization (EUA). This EUA will remain in effect (meaning this test can be used) for the duration of the COVID-19 declaration under Section 564(b)(1) of the Act, 21 U.S.C. section 360bbb-3(b)(1), unless the authorization is terminated or revoked.  Performed at [facility], [address] [place], [place] [address]      Labs: BNP (last 3 results) Recent Labs    09/12/20 1320 10/08/20 1451 11/30/20 1117  BNP 3,375.0* 561.2* [id number]*   Basic Metabolic Panel: Recent Labs  Lab 11/30/20 0909 12/01/20 0454  NA 139 138  K 4.7 4.1  CL 109 107  CO2 24 25  GLUCOSE 125* 86  BUN 32* 31*  CREATININE 2.26* 2.20*  CALCIUM 8.5* 8.3*   Liver Function Tests: Recent Labs  Lab 11/30/20 0909  AST 11*  ALT  9  ALKPHOS 60  BILITOT 1.0  PROT 6.0*  ALBUMIN 3.4*   No results for input(s): LIPASE, AMYLASE in the last 168 hours. No results for input(s): AMMONIA in the last 168 hours. CBC: Recent Labs  Lab 11/30/20 0909 11/30/20 1124 11/30/20 1437 11/30/20 2250 12/01/20 0454 12/01/20 1115  WBC 9.2 10.2 9.6 9.5 10.6* 9.2  NEUTROABS 7.0  --   --   --   --   --   HGB 10.8* 10.2* 10.3* 9.6* 9.5* 9.9*  HCT 35.6* 33.2* 33.3* 30.1* 30.3* 32.0*  MCV 80.7 82.2 82.0 80.9 81.5 80.8  PLT 246 246 246 232 230 237   Cardiac Enzymes: No results for input(s): CKTOTAL, CKMB, CKMBINDEX, TROPONINI in the last 168  hours. BNP: Invalid input(s): POCBNP CBG: No results for input(s): GLUCAP in the last 168 hours. D-Dimer No results for input(s): DDIMER in the last 72 hours. Hgb A1c No results for input(s): HGBA1C in the last 72 hours. Lipid Profile No results for input(s): CHOL, HDL, LDLCALC, TRIG, CHOLHDL, LDLDIRECT in the last 72 hours. Thyroid function studies No results for input(s): TSH, T4TOTAL, T3FREE, THYROIDAB in the last 72 hours.  Invalid input(s): FREET3 Anemia work up No results for input(s): VITAMINB12, FOLATE, FERRITIN, TIBC, IRON, RETICCTPCT in the last 72 hours. Urinalysis    Component Value Date/Time   COLORURINE YELLOW (A) 07/20/2020 2343   APPEARANCEUR CLEAR (A) 07/20/2020 2343   APPEARANCEUR Clear 02/26/2014 1126   LABSPEC 1.016 07/20/2020 2343   LABSPEC 1.008 02/26/2014 1126   PHURINE 5.0 07/20/2020 2343   GLUCOSEU NEGATIVE 07/20/2020 2343   GLUCOSEU Negative 02/26/2014 1126   [place] 07/20/2020 2343   BILIRUBINUR NEGATIVE 07/20/2020 2343   BILIRUBINUR Negative 02/26/2014 [place] 07/20/2020 2343   PROTEINUR 100 (A) 07/20/2020 2343   NITRITE NEGATIVE 07/20/2020 2343   LEUKOCYTESUR TRACE (A) 07/20/2020 2343   LEUKOCYTESUR 1+ 02/26/2014 1126   Sepsis Labs Invalid input(s): PROCALCITONIN,  WBC,  LACTICIDVEN Microbiology Recent Results (from the  past 240 hour(s))  Resp Panel by RT-PCR (Flu A&B, Covid) Nasopharyngeal Swab     Status: None   Collection Time: 11/30/20 11:24 AM   Specimen: Nasopharyngeal Swab; Nasopharyngeal(NP) swabs in vial transport medium  Result Value Ref Range Status   SARS Coronavirus 2 by RT PCR NEGATIVE NEGATIVE Final    Comment: (NOTE) SARS-CoV-2 target nucleic acids are NOT DETECTED.  The SARS-CoV-2 RNA is generally detectable in upper respiratory specimens during the acute phase of infection. The lowest concentration of SARS-CoV-2 viral copies this assay can detect is 138 copies/mL. A negative result does not preclude SARS-Cov-2 infection and should not be used as the sole basis for treatment or other patient management decisions. A negative result may occur with  improper specimen collection/handling, submission of specimen other than nasopharyngeal swab, presence of viral mutation(s) within the areas targeted by this assay, and inadequate number of viral copies(<138 copies/mL). A negative result must be combined with clinical observations, patient history, and epidemiological information. The expected result is Negative.  Fact Sheet for Patients:  EntrepreneurPulse.com.au  Fact Sheet for Healthcare Providers:  IncredibleEmployment.be  This test is no t yet approved or cleared by the [place] FDA and  has been authorized for detection and/or diagnosis of SARS-CoV-2 by FDA under an Emergency Use Authorization (EUA). This EUA will remain  in effect (meaning this test can be used) for the duration of the COVID-19 declaration under Section 564(b)(1) of the Act, 21 U.S.C.section 360bbb-3(b)(1), unless the authorization is terminated  or revoked sooner.       Influenza A by PCR NEGATIVE NEGATIVE Final   Influenza B by PCR NEGATIVE NEGATIVE Final    Comment: (NOTE) The Xpert Xpress SARS-CoV-2/FLU/RSV plus assay is intended as an aid in the diagnosis of  influenza from Nasopharyngeal swab specimens and should not be used as a sole basis for treatment. Nasal washings and aspirates are unacceptable for Xpert Xpress SARS-CoV-2/FLU/RSV testing.  Fact Sheet for Patients: EntrepreneurPulse.com.au  Fact Sheet for Healthcare Providers: IncredibleEmployment.be  This test is not yet approved or cleared by the [place] FDA and has been authorized for detection and/or diagnosis of SARS-CoV-2 by FDA under an Emergency Use Authorization (EUA). This EUA will remain in  effect (meaning this test can be used) for the duration of the COVID-19 declaration under Section 564(b)(1) of the Act, 21 U.S.C. section 360bbb-3(b)(1), unless the authorization is terminated or revoked.  Performed at [facility], [address] [place], [place] [address]     Time coordinating discharge: Over 30 minutes  SIGNED:  Lorella Nimrod, MD  [facility] 12/01/2020, 1:14 PM  If 7PM-7AM, please contact night-coverage [email or web address]  This record has been created using Systems analyst. Errors have been sought and corrected,but may not always be located. Such creation errors do not reflect on the standard of care.

## 2020-12-09 ENCOUNTER — Ambulatory Visit (INDEPENDENT_AMBULATORY_CARE_PROVIDER_SITE_OTHER): Payer: Medicaid Other | Admitting: Cardiovascular Disease

## 2020-12-09 ENCOUNTER — Other Ambulatory Visit: Payer: Self-pay

## 2020-12-09 ENCOUNTER — Encounter: Payer: Self-pay | Admitting: Cardiovascular Disease

## 2020-12-09 VITALS — BP 140/98 | HR 69 | Ht 71.0 in | Wt 251.5 lb

## 2020-12-09 DIAGNOSIS — I5020 Unspecified systolic (congestive) heart failure: Secondary | ICD-10-CM | POA: Diagnosis not present

## 2020-12-09 DIAGNOSIS — I428 Other cardiomyopathies: Secondary | ICD-10-CM

## 2020-12-09 DIAGNOSIS — I480 Paroxysmal atrial fibrillation: Secondary | ICD-10-CM

## 2020-12-09 DIAGNOSIS — E785 Hyperlipidemia, unspecified: Secondary | ICD-10-CM

## 2020-12-09 DIAGNOSIS — G4733 Obstructive sleep apnea (adult) (pediatric): Secondary | ICD-10-CM

## 2020-12-09 DIAGNOSIS — E1122 Type 2 diabetes mellitus with diabetic chronic kidney disease: Secondary | ICD-10-CM

## 2020-12-09 DIAGNOSIS — I10 Essential (primary) hypertension: Secondary | ICD-10-CM | POA: Diagnosis not present

## 2020-12-09 DIAGNOSIS — Z79899 Other long term (current) drug therapy: Secondary | ICD-10-CM

## 2020-12-09 MED ORDER — DAPAGLIFLOZIN PROPANEDIOL 10 MG PO TABS: 10.0000 mg | ORAL_TABLET | Freq: Every day | ORAL | 11 refills | Status: DC

## 2020-12-09 MED ORDER — CARVEDILOL 12.5 MG PO TABS: 12.5000 mg | ORAL_TABLET | Freq: Two times a day (BID) | ORAL | 3 refills | Status: DC

## 2020-12-09 NOTE — Progress Notes (Signed)
Date:  12/09/2020   ID:  Hector [name], DOB [birth year], MRN [id number]  Patient Location:  [place] [id number]   Provider location:   [facility], [place] office  PCP:  [name], MD  Cardiologist:  [name]   Chief Complaint  Patient presents with   2 month follow up     "Doing well." Medications reviewed by the patient verbally.     History of Present Illness:    Hector [name] is a 55 y.o. male  past medical history of alcohol abuse nonischemic cardiomyopathy, possibly alcohol-related, cardiac cath 2016 nonobstructive CAD,  chronic combined heart failure,  hypertension,  obesity,  Paroxysmal atrial fibrillation.  Flutter Possible sleep apnea Ejection fraction in the 20s% dating back to 2009, confirmed by echo 2012, ejection fraction 45 to 50% in October 2018 Who presents today for follow-up of his nonischemic cardiomyopathy, acute on chronic diastolic and systolic CHF, paroxysmal atrial fibrillation  In the ER, 9/22, foot pain, atrial fib Off amiodarone, for elevated TSH Other documentation from emergency room doctor that he was not taking other medications appropriately such as carvedilol  Blood pressure elevated on today's visit Reports he is taking the carvedilol, hydralazine, Lasix  On disability, sits at home, No hobbies, wife getting over surgery Problems with his left arm, left hand, scheduled to see orthopedics next week  Lab work reviewed, creatinine 2.2, followed by nephrology  EKG personally reviewed by myself on todays visit Normal sinus rhythm rate 69 bpm PACs poor R wave progression to the anterior precordial leads  In the hospital November 2021 Covid 19 pneumonia, respiratory failure, ARDS Given remdesivir, steroids,baricitinib Nephrology follow-up for renal failure Delene Loll was held secondary to renal failure Is on Lasix as needed  echocardiogram December 2021  ejection fraction 45 to 50%  EKG Feb 05 2020: NSR  In 2016 he was diagnosed with atrial flutter and was cardioverted in June 2016.  reverted to atrial fibrillation and was placed on amiodarone and Eliquis   Seen by electrophysiology in 2016 in the setting of LV dysfunction and  catheterization at that time showed nonobstructive CAD.     echo in April 2018 showed recurrent LV dysfunction with an EF of 30-35% diffuse hypokinesis in the setting of some noncompliance with medications.    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 12/2007 MV: EF 28%, no isch;  b. 8/12 Echo: EF 25-35%; c. 02/2014 Echo: EF 20-25%; d. 12/2014 Cath: minimal CAD; e. 01/2015 Echo: EF 50-55%;  d. 05/2016 Echo: EF 30-35%, diff HK, gr2 DD; e. 11/2016 Echo: EF 45-50%, diff HK.   Chronic combined systolic (congestive) and diastolic (congestive) heart failure ([place])    a. 05/2016 Most recent Echo: EF 30-35%, diff HK, Gr2 DD, mild MR, mod dil LA; b. 11/2016 Echo: EF 45-50%, diff HK.   CKD (chronic kidney disease), stage III (HCC)    Elevated troponin    Chronically elevated. - level was 0.17-0.10 during recent admission   Essential hypertension    GI bleed 11/2013   Hyperlipidemia    Pancreatitis    Paroxysmal A-fib (HCC)    a. new onset s/p unsuccessful TEE/DCCV on 08/16/2014; b. on amio/eliquis (CHA2DS2VASc = 2-3); c. reports 1-2 hrs of afib ~ q65mo.   [place]    a.  new onset 07/2014; b. s/p unsuccessful TEE/DCCV 08/16/2014; c. on apixaban.   Sleep-disordered breathing    Has yet to have a sleep study   Stroke [name])    Past Surgical History:  Procedure Laterality Date   CARDIAC CATHETERIZATION N/A 01/09/2015   Procedure: Left Heart Cath and Coronary Angiography;  Surgeon: [name], MD;  Location: [place] [facility];  Service: Cardiovascular;  Laterality: N/A;    COLONOSCOPY N/A 07/22/2020   Procedure: COLONOSCOPY;  Surgeon: Toledo, Benay Pike, MD;  Location: [facility] ENDOSCOPY;  Service: Gastroenterology;  Laterality: N/A;   ELECTROPHYSIOLOGIC STUDY N/A 08/16/2014   Procedure: CARDIOVERSION;  Surgeon: [name], MD;  Location: [facility] ORS;  Service: Cardiovascular;  Laterality: N/A;   FLEXIBLE SIGMOIDOSCOPY N/A 10/10/2015   Procedure: FLEXIBLE SIGMOIDOSCOPY;  Surgeon: [name], MD;  Location: [facility] ENDOSCOPY;  Service: Endoscopy;  Laterality: N/A;   KNEE SURGERY Right    NM MYOVIEW LTD  November 2011   No ischemia or infarction. EF 50-55% ( no improvement from 2009 Myoview EF of 28%   TEE WITHOUT CARDIOVERSION N/A 08/16/2014   Procedure: TRANSESOPHAGEAL ECHOCARDIOGRAM (TEE);  Surgeon: [name], MD;  Location: [facility] ORS;  Service: Cardiovascular;  Laterality: N/A;     Current Meds  Medication Sig   allopurinol (ZYLOPRIM) 100 MG tablet Take 100 mg by mouth daily.   apixaban (ELIQUIS) 5 MG TABS tablet Take 1 tablet (5 mg total) by mouth 2 (two) times daily.   atorvastatin (LIPITOR) 80 MG tablet Take 1 tablet (80 mg total) by mouth daily.   carvedilol (COREG) 6.25 MG tablet Take 1 tablet (6.25 mg total) by mouth 2 (two) times daily with a meal.   ferrous gluconate (FERGON) 324 MG tablet Take 324 mg by mouth daily with breakfast.   furosemide (LASIX) 40 MG tablet Take 1 tablet (40 mg total) by mouth daily.   hydrALAZINE (APRESOLINE) 25 MG tablet Take 1 tablet (25 mg total) by mouth 3 (three) times daily.   levothyroxine (SYNTHROID) 100 MCG tablet Take 100 mcg by mouth daily before breakfast.     Allergies:   Esomeprazole magnesium and Eggs or egg-derived products   Social History   Tobacco Use   Smoking status: Former    Packs/day: 1.00    Years: 12.00    Pack years: 12.00    Types: Cigarettes    Quit date: 02/23/1998    Years since quitting: 22.8   Smokeless tobacco: Never  Vaping Use   Vaping Use: Never used  Substance Use Topics    Alcohol use: Not Currently    Alcohol/week: 0.0 standard drinks    Comment: Past heavy drinker   Drug use: No     Current Outpatient Medications on File Prior to Visit  Medication Sig Dispense Refill   allopurinol (ZYLOPRIM) 100 MG tablet Take 100 mg by mouth daily.     apixaban (ELIQUIS) 5 MG TABS tablet Take 1 tablet (5 mg total) by mouth 2 (two) times daily. 60 tablet 5   atorvastatin (LIPITOR) 80 MG tablet Take 1 tablet (80 mg total) by mouth daily. 30 tablet 5   carvedilol (COREG) 6.25 MG tablet Take 1 tablet (6.25 mg total) by mouth 2 (two) times daily with a meal. 60 tablet 5   ferrous gluconate (FERGON) 324 MG tablet Take 324 mg by mouth daily with breakfast.     furosemide (LASIX) 40 MG tablet Take 1 tablet (40 mg total) by mouth daily. 30 tablet 5  hydrALAZINE (APRESOLINE) 25 MG tablet Take 1 tablet (25 mg total) by mouth 3 (three) times daily. 90 tablet 5   levothyroxine (SYNTHROID) 100 MCG tablet Take 100 mcg by mouth daily before breakfast.     No current facility-administered medications on file prior to visit.     Family Hx: The patient's family history includes Diabetes in his mother; Hyperlipidemia in his mother; Hypertension in his mother.  ROS:   Please see the history of present illness.    Review of Systems  Constitutional: Negative.   Respiratory: Negative.    Cardiovascular: Negative.   Gastrointestinal: Negative.   Musculoskeletal: Negative.   Neurological: Negative.   Psychiatric/Behavioral: Negative.    All other systems reviewed and are negative.    Labs/Other Tests and Data Reviewed:    Recent Labs: 11/15/2020: Magnesium 1.9; TSH 12.200 11/30/2020: ALT 9; B Natriuretic Peptide 948.8 12/01/2020: BUN 31; Creatinine, Ser 2.20; Hemoglobin 9.9; Platelets 237; Potassium 4.1; Sodium 138   Recent Lipid Panel Lab Results  Component Value Date/Time   CHOL 89 02/03/2020 06:02 AM   CHOL 249 (H) 10/11/2018 12:26 PM   CHOL 118 03/08/2014 04:10 AM   TRIG  125 02/03/2020 06:02 AM   TRIG 95 03/08/2014 04:10 AM   HDL 15 (L) 02/03/2020 06:02 AM   HDL 36 (L) 10/11/2018 12:26 PM   HDL 28 (L) 03/08/2014 04:10 AM   CHOLHDL 5.9 02/03/2020 06:02 AM   LDLCALC 49 02/03/2020 06:02 AM   LDLCALC 174 (H) 10/11/2018 12:26 PM   LDLCALC 71 03/08/2014 04:10 AM    Wt Readings from Last 3 Encounters:  12/09/20 251 lb 8 oz (114.1 kg)  12/01/20 243 lb 14.4 oz (110.6 kg)  11/15/20 256 lb (116.1 kg)     Exam:    BP (!) 140/98 (BP Location: Right Arm, Patient Position: Sitting, Cuff Size: Normal)   Pulse 69   Ht '5\' 11"'$  (1.803 m)   Wt 251 lb 8 oz (114.1 kg)   SpO2 98%   BMI 35.08 kg/m  Constitutional:  oriented to person, place, and time. No distress.  HENT:  Head: Grossly normal Eyes:  no discharge. No scleral icterus.  Neck: No JVD, no carotid bruits  Cardiovascular: Regular rate and rhythm, no murmurs appreciated Pulmonary/Chest: Clear to auscultation bilaterally, no wheezes or rails Abdominal: Soft.  no distension.  no tenderness.  Musculoskeletal: Normal range of motion Neurological:  normal muscle tone. Coordination normal. No atrophy Skin: Skin warm and dry Psychiatric: normal affect, pleasant  ASSESSMENT & PLAN:    Chronic systolic heart failure (HCC) echocardiogram reviewed, EF 30 to 35% Recommend he start Farxiga 10 mg daily Stressed importance of taking his carvedilol, hydralazine Blood pressure elevated today, recommend he increase his Coreg up to 12.5 twice daily given recent emergency room evaluation for paroxysmal atrial fibrillation Stressed the importance of taking his Eliquis  Paroxysmal atrial flutter (HCC) Off amiodarone secondary to thyroid disease Coreg up to 12.5 twice daily, recommend he take his medications as directed  Paroxysmal atrial fibrillation ([place]) Plan as above  Nonischemic cardiomyopathy ([place]) Mildly depressed ejection fraction Continue Coreg, hydralazine, add Farxiga, continue Lasix  Obstructive sleep  apnea Recommend CPAP on a regular basis May improve with 80 pound weight loss  Mixed hyperlipidemia Cholesterol at goal   Total encounter time more than 25 minutes  Greater than 50% was spent in counseling and coordination of care with the patient   Signed, Ida Rogue, MD  12/09/2020 1:51 PM    [facility] [address], [place], [place] [address]

## 2020-12-09 NOTE — Patient Instructions (Addendum)
Medication Instructions:  Coreg  Increase up to 12.5 mg Twice a day  Please START farxiga 10 mg daily  If you need a refill on your cardiac medications before your next appointment, please call your pharmacy.   Lab work: BMP ( 2-3 weeks)  Testing/Procedures: No new testing needed  Follow-Up: At [facility], you and your health needs are our priority.  As part of our continuing mission to provide you with exceptional heart care, we have created designated Provider Care Teams.  These Care Teams include your primary Cardiologist (physician) and Advanced Practice Providers (APPs -  Physician Assistants and Nurse Practitioners) who all work together to provide you with the care you need, when you need it.  You will need a follow up appointment in 6 months  Providers on your designated Care Team:   Murray Hodgkins, NP Christell Faith, PA-C Marrianne Mood, PA-C Cadence [place], [place]  COVID-19 Vaccine Information can be found at: ShippingScam.co.uk For questions related to vaccine distribution or appointments, please email [email or web address] or call [phone].

## 2020-12-10 ENCOUNTER — Telehealth: Payer: Self-pay | Admitting: *Deleted

## 2020-12-10 NOTE — Telephone Encounter (Signed)
Incoming fax from [place].   Hector [name] was approved.   Approved quantity is 30 tablets for a 30 day supply.

## 2020-12-10 NOTE — Telephone Encounter (Signed)
Pt requiring PA on Farxiga 10 mg tablet. PA has been process via covermymeds. Awaiting approval.  [employer] has not yet replied to your PA request. You may close this dialog, return to your dashboard, and perform other tasks.  To check for an update later, open this request again from your dashboard.  If [employer] has not replied to your request within 24 hours, please reach out to the plan using the phone number located on the back on the [employer] card.

## 2020-12-27 ENCOUNTER — Other Ambulatory Visit: Payer: Self-pay

## 2020-12-27 ENCOUNTER — Other Ambulatory Visit (INDEPENDENT_AMBULATORY_CARE_PROVIDER_SITE_OTHER): Payer: Medicaid Other

## 2020-12-27 DIAGNOSIS — Z79899 Other long term (current) drug therapy: Secondary | ICD-10-CM | POA: Diagnosis not present

## 2020-12-28 LAB — BASIC METABOLIC PANEL
BUN/Creatinine Ratio: 11 (ref 9–20)
BUN: 27 mg/dL — ABNORMAL HIGH (ref 6–24)
CO2: 20 mmol/L (ref 20–29)
Calcium: 9.1 mg/dL (ref 8.7–10.2)
Chloride: 103 mmol/L (ref 96–106)
Creatinine, Ser: 2.45 mg/dL — ABNORMAL HIGH (ref 0.76–1.27)
Glucose: 79 mg/dL (ref 70–99)
Potassium: 5 mmol/L (ref 3.5–5.2)
Sodium: 140 mmol/L (ref 134–144)
eGFR: 30 mL/min/{1.73_m2} — ABNORMAL LOW (ref >59)

## 2021-01-08 ENCOUNTER — Emergency Department
Admission: EM | Admit: 2021-01-08 | Discharge: 2021-01-08 | Disposition: A | Discharge status: Home / Self Care | Payer: Medicaid Other | Attending: Emergency Medicine | Admitting: Emergency Medicine

## 2021-01-08 ENCOUNTER — Other Ambulatory Visit: Payer: Self-pay

## 2021-01-08 ENCOUNTER — Emergency Department: Payer: Medicaid Other

## 2021-01-08 DIAGNOSIS — Z7901 Long term (current) use of anticoagulants: Secondary | ICD-10-CM | POA: Diagnosis not present

## 2021-01-08 DIAGNOSIS — Z79899 Other long term (current) drug therapy: Secondary | ICD-10-CM | POA: Insufficient documentation

## 2021-01-08 DIAGNOSIS — I130 Hypertensive heart and chronic kidney disease with heart failure and stage 1 through stage 4 chronic kidney disease, or unspecified chronic kidney disease: Secondary | ICD-10-CM | POA: Insufficient documentation

## 2021-01-08 DIAGNOSIS — N184 Chronic kidney disease, stage 4 (severe): Secondary | ICD-10-CM | POA: Diagnosis not present

## 2021-01-08 DIAGNOSIS — I4891 Unspecified atrial fibrillation: Secondary | ICD-10-CM | POA: Diagnosis not present

## 2021-01-08 DIAGNOSIS — Z87891 Personal history of nicotine dependence: Secondary | ICD-10-CM | POA: Insufficient documentation

## 2021-01-08 DIAGNOSIS — I5042 Chronic combined systolic (congestive) and diastolic (congestive) heart failure: Secondary | ICD-10-CM | POA: Insufficient documentation

## 2021-01-08 DIAGNOSIS — M109 Gout, unspecified: Secondary | ICD-10-CM | POA: Insufficient documentation

## 2021-01-08 DIAGNOSIS — R200 Anesthesia of skin: Secondary | ICD-10-CM | POA: Diagnosis present

## 2021-01-08 LAB — CBC WITH DIFFERENTIAL/PLATELET
Abs Immature Granulocytes: 0.06 10*3/uL (ref 0.00–0.07)
Basophils Absolute: 0.1 10*3/uL (ref 0.0–0.1)
Basophils Relative: 0 %
Eosinophils Absolute: 0.1 10*3/uL (ref 0.0–0.5)
Eosinophils Relative: 0 %
HCT: 38.3 % — ABNORMAL LOW (ref 39.0–52.0)
Hemoglobin: 11.9 g/dL — ABNORMAL LOW (ref 13.0–17.0)
Immature Granulocytes: 1 %
Lymphocytes Relative: 10 %
Lymphs Abs: 1.2 10*3/uL (ref 0.7–4.0)
MCH: 24.2 pg — ABNORMAL LOW (ref 26.0–34.0)
MCHC: 31.1 g/dL (ref 30.0–36.0)
MCV: 77.8 fL — ABNORMAL LOW (ref 80.0–100.0)
Monocytes Absolute: 1 10*3/uL (ref 0.1–1.0)
Monocytes Relative: 8 %
Neutro Abs: 9.9 10*3/uL — ABNORMAL HIGH (ref 1.7–7.7)
Neutrophils Relative %: 81 %
Platelets: 242 10*3/uL (ref 150–400)
RBC: 4.92 MIL/uL (ref 4.22–5.81)
RDW: 18.6 % — ABNORMAL HIGH (ref 11.5–15.5)
WBC: 12.3 10*3/uL — ABNORMAL HIGH (ref 4.0–10.5)
nRBC: 0 % (ref 0.0–0.2)

## 2021-01-08 LAB — BASIC METABOLIC PANEL
Anion gap: 8 (ref 5–15)
BUN: 37 mg/dL — ABNORMAL HIGH (ref 6–20)
CO2: 22 mmol/L (ref 22–32)
Calcium: 9 mg/dL (ref 8.9–10.3)
Chloride: 106 mmol/L (ref 98–111)
Creatinine, Ser: 2.76 mg/dL — ABNORMAL HIGH (ref 0.61–1.24)
GFR, Estimated: 26 mL/min — ABNORMAL LOW (ref >60)
Glucose, Bld: 137 mg/dL — ABNORMAL HIGH (ref 70–99)
Potassium: 4.5 mmol/L (ref 3.5–5.1)
Sodium: 136 mmol/L (ref 135–145)

## 2021-01-08 LAB — SYNOVIAL CELL COUNT + DIFF, W/ CRYSTALS
Eosinophils-Synovial: 0 %
Lymphocytes-Synovial Fld: 1 %
Monocyte-Macrophage-Synovial Fluid: 3 %
Neutrophil, Synovial: 96 %
WBC, Synovial: 233674 /mm3 — ABNORMAL HIGH (ref 0–200)

## 2021-01-08 LAB — C-REACTIVE PROTEIN: CRP: 1.9 mg/dL — ABNORMAL HIGH (ref <1.0)

## 2021-01-08 LAB — SEDIMENTATION RATE: Sed Rate: 11 mm/hr (ref 0–20)

## 2021-01-08 IMAGING — DX DG ELBOW COMPLETE 3+V*L*
4 series · 4 of 4 positions shown · non-contrast
Comparison: [DATE]

CLINICAL DATA: Elbow pain.  History of gout.  No recent trauma

EXAM:
LEFT ELBOW - COMPLETE 3+ VIEW

[elbow ap]
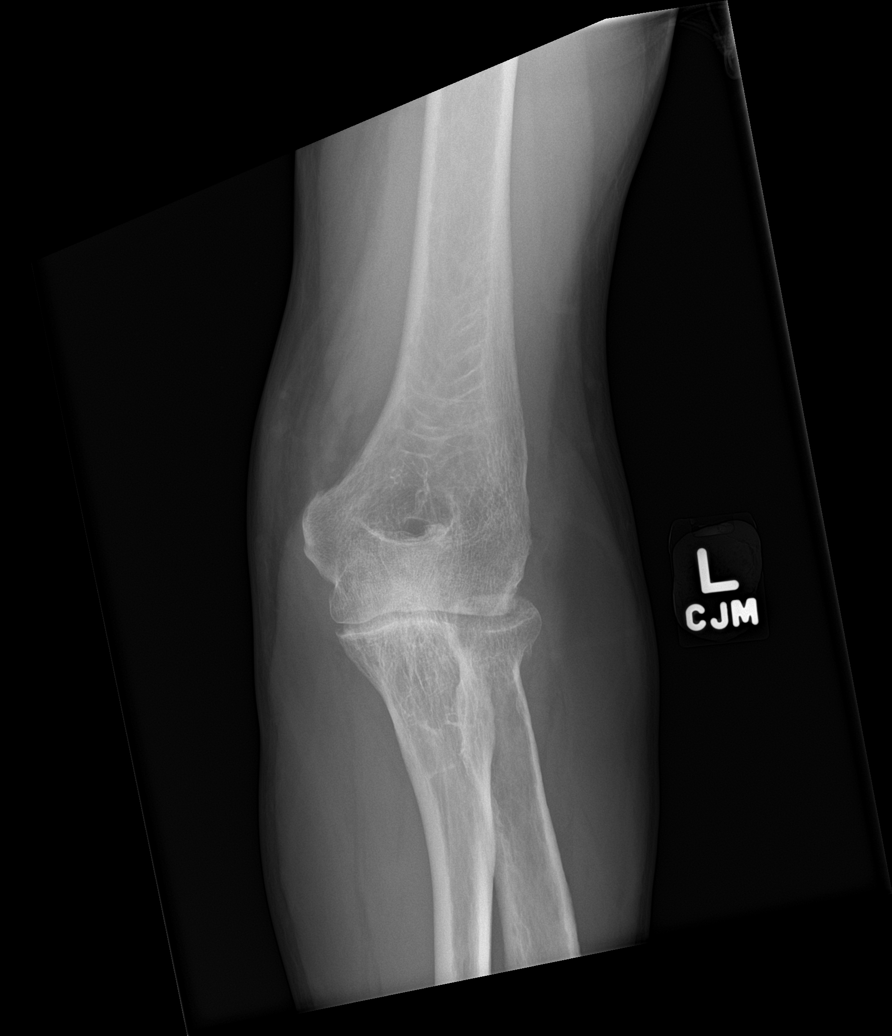

[elbow obl (1 of 2)]
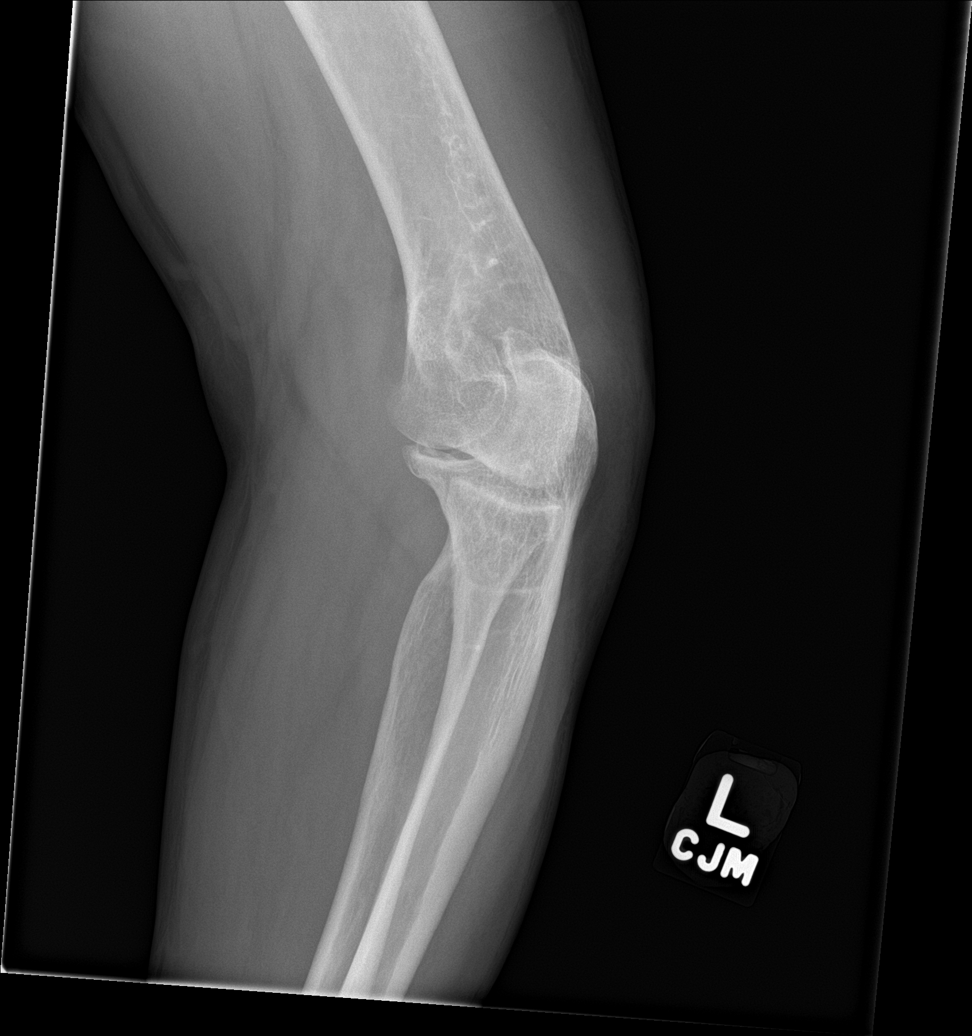

[elbow obl (2 of 2)]
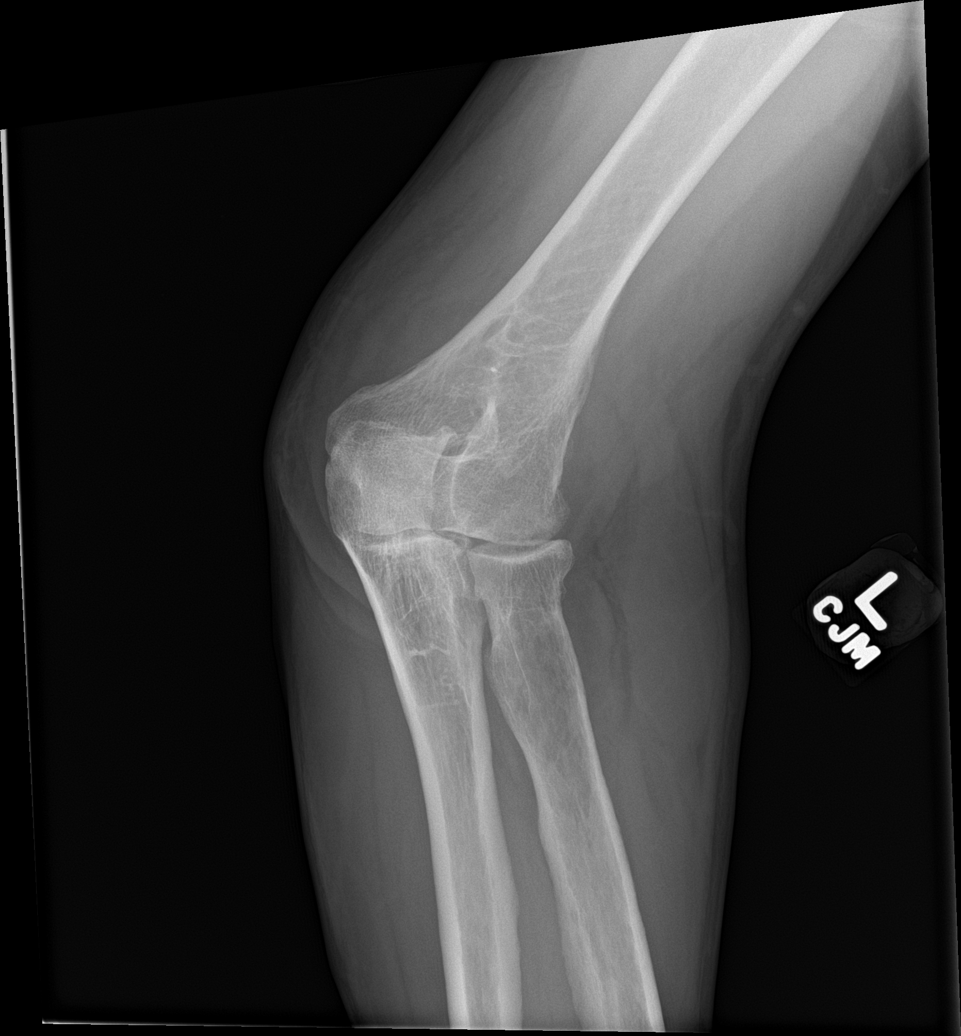

[elbow lat]
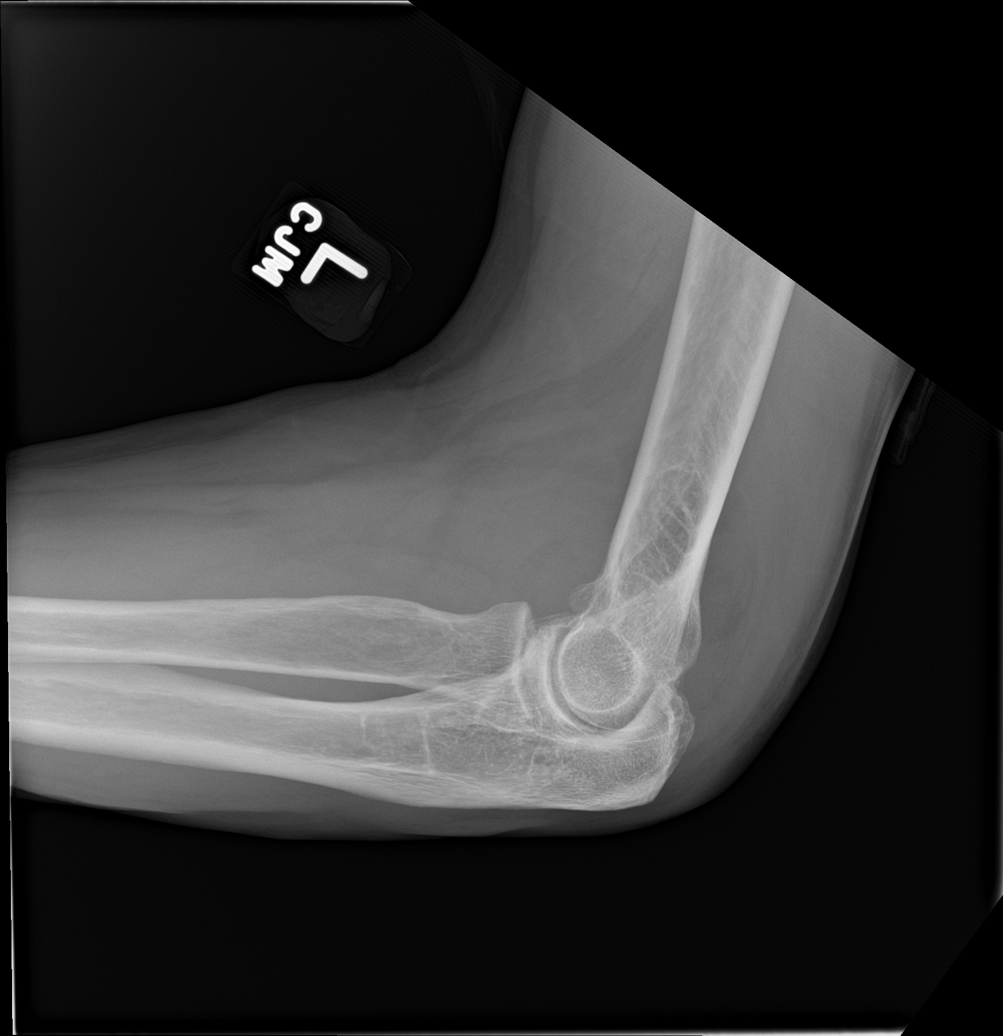

[4 of 4 positions shown; findings below may reference images not displayed]

FINDINGS: Elevation of the anterior and posterior fat pads of the elbow
indicative of a large left elbow joint effusion. No evidence of
acute fracture. No malalignment. Similar degree of joint space
narrowing. Mild diffuse soft tissue swelling.
IMPRESSION: 1. Large left elbow joint effusion. In the absence of trauma, this
finding is nonspecific and can be associated with an underlying
inflammatory or crystalline arthropathy including gout.
2. No evidence of acute fracture or malalignment.

## 2021-01-08 MED ORDER — OXYCODONE-ACETAMINOPHEN 5-325 MG PO TABS: 1.0000 | ORAL_TABLET | Freq: Three times a day (TID) | ORAL | 0 refills | Status: AC | PRN

## 2021-01-08 MED ORDER — FENTANYL CITRATE PF 50 MCG/ML IJ SOSY
50.0000 ug | PREFILLED_SYRINGE | Freq: Once | INTRAMUSCULAR | Status: AC
  Administered 2021-01-08: 50 ug via INTRAVENOUS
  Filled 2021-01-08: qty 1

## 2021-01-08 MED ORDER — LIDOCAINE-EPINEPHRINE 2 %-1:100000 IJ SOLN
20.0000 mL | Freq: Once | INTRAMUSCULAR | Status: AC
  Administered 2021-01-08: 20 mL
  Filled 2021-01-08: qty 1

## 2021-01-08 MED ORDER — ACETAMINOPHEN 325 MG PO TABS
650.0000 mg | ORAL_TABLET | Freq: Once | ORAL | Status: AC
  Administered 2021-01-08: 650 mg via ORAL
  Filled 2021-01-08: qty 2

## 2021-01-08 MED ORDER — OXYCODONE-ACETAMINOPHEN 5-325 MG PO TABS
1.0000 | ORAL_TABLET | Freq: Once | ORAL | Status: AC
  Administered 2021-01-08: 1 via ORAL
  Filled 2021-01-08: qty 1

## 2021-01-08 MED ORDER — PREDNISONE 20 MG PO TABS
50.0000 mg | ORAL_TABLET | Freq: Once | ORAL | Status: AC
  Administered 2021-01-08: 50 mg via ORAL
  Filled 2021-01-08: qty 3

## 2021-01-08 MED ORDER — PREDNISONE 10 MG PO TABS: ORAL_TABLET | ORAL | 0 refills | Status: AC

## 2021-01-08 NOTE — ED Provider Notes (Signed)
[facility] Emergency Department Provider Note  ____________________________________________   Event Date/Time   First MD Initiated Contact with Patient 01/08/21 [phone]     (approximate)  I have reviewed the triage vital signs and the nursing notes.   HISTORY  Chief Complaint No chief complaint on file.   HPI Hector [name] is a 55 y.o. 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 fib on Eliquis, CVA without residual significant deficits and cervical radiculopathy with about 1 year of decreased sensation and near complete numbness in the left forearm and hand with patient stating his employment see neurology about this tomorrow who presents for assessment of some acute left elbow pain that he states he first noticed yesterday.  Patient states he has never had gout in his elbow.  He does not recall any injuries or falls.  He denies any new pain in his wrist or shoulder.  States he has severe pain limiting his range of motion of the left elbow.  No prior similar episodes.  He has not taken any analgesia prior to arrival.  He denies any rashes, fevers, chills, chest pain, neck pain, headache, earache, sore throat, cough, nausea, vomiting, diarrhea, urinary symptoms, abdominal pain or any other acute joint pains.  No other acute concerns at this time.         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 12/2007 MV: EF 28%, no isch;  b. 8/12 Echo: EF 25-35%; c. 02/2014 Echo: EF 20-25%; d. 12/2014 Cath: minimal CAD; e. 01/2015 Echo: EF 50-55%;  d. 05/2016 Echo: EF 30-35%, diff HK, gr2 DD; e. 11/2016 Echo: EF 45-50%, diff HK.   Chronic combined systolic (congestive) and diastolic (congestive) heart failure ([place])    a. 05/2016 Most recent Echo: EF 30-35%, diff HK, Gr2 DD, mild MR, mod dil LA; b. 11/2016 Echo: EF 45-50%, diff HK.   CKD  (chronic kidney disease), stage III (HCC)    Elevated troponin    Chronically elevated. - level was 0.17-0.10 during recent admission   Essential hypertension    GI bleed 11/2013   Hyperlipidemia    Pancreatitis    Paroxysmal A-fib (HCC)    a. new onset s/p unsuccessful TEE/DCCV on 08/16/2014; b. on amio/eliquis (CHA2DS2VASc = 2-3); c. reports 1-2 hrs of afib ~ q56mo.   Paroxysmal atrial flutter ([facility]lm    a. new onset 07/2014; b. s/p unsuccessful TEE/DCCV 08/16/2014; c. on apixaban.   Sleep-disordered breathing    Has yet to have a sleep study   Stroke [facility]     Patient Active Problem List   Diagnosis Date Noted   GI bleeding 11/30/2020   Hypothyroidism 11/30/2020   [place] ([place]    Alcohol abuse    CKD (chronic kidney disease), stage IV (HCC)    Chronic systolic CHF (congestive heart failure) (HCC)    Iron deficiency anemia    Congestive heart failure (CHF) ([place] 09/12/2020   CKD (chronic kidney disease) stage 4, GFR 15-29 ml/min ([place] 07/21/2020   GIB (gastrointestinal bleeding) 07/21/2020   Acute blood loss anemia 07/21/2020   Hyperthyroidism without thyroid nodule 02/03/2020   Acute respiratory distress syndrome (ARDS) due to COVID-19 virus ([place] 12/31/2019   Acute renal failure superimposed on stage 3b chronic kidney disease ([place] 12/31/2019   Hyperkalemia 12/31/2019   Hyponatremia 12/31/2019   Obstructive sleep apnea 04/21/2016  Nonischemic cardiomyopathy ([place]) 05/26/2015   Chronic combined systolic (congestive) and diastolic (congestive) heart failure ([place]) 04/01/2015   Essential hypertension 04/01/2015   Sinus bradycardia 01/29/2015   Paroxysmal atrial fibrillation ([place]) 08/16/2014   Paroxysmal atrial flutter ([place]) 08/14/2014    Class: Acute   Chronic kidney disease (CKD), stage III (moderate) ([place]) 07/02/2014   Hypertensive heart disease    Hyperlipidemia     Past Surgical History:  Procedure Laterality Date   CARDIAC CATHETERIZATION N/A 01/09/2015   Procedure:  Left Heart Cath and Coronary Angiography;  Surgeon: [name], MD;  Location: [facility];  Service: Cardiovascular;  Laterality: N/A;   COLONOSCOPY N/A 07/22/2020   Procedure: COLONOSCOPY;  Surgeon: Toledo, Benay Pike, MD;  Location: [facility] ENDOSCOPY;  Service: Gastroenterology;  Laterality: N/A;   ELECTROPHYSIOLOGIC STUDY N/A 08/16/2014   Procedure: CARDIOVERSION;  Surgeon: [name], MD;  Location: [facility] ORS;  Service: Cardiovascular;  Laterality: N/A;   FLEXIBLE SIGMOIDOSCOPY N/A 10/10/2015   Procedure: FLEXIBLE SIGMOIDOSCOPY;  Surgeon: [name], MD;  Location: [facility] ENDOSCOPY;  Service: Endoscopy;  Laterality: N/A;   KNEE SURGERY Right    NM MYOVIEW LTD  November 2011   No ischemia or infarction. EF 50-55% ( no improvement from 2009 Myoview EF of 28%   TEE WITHOUT CARDIOVERSION N/A 08/16/2014   Procedure: TRANSESOPHAGEAL ECHOCARDIOGRAM (TEE);  Surgeon: [name], MD;  Location: [facility] ORS;  Service: Cardiovascular;  Laterality: N/A;    Prior to Admission medications   Medication Sig Start Date End Date Taking? Authorizing Provider  oxyCODONE-acetaminophen (PERCOCET) 5-325 MG tablet Take 1 tablet by mouth every 8 (eight) hours as needed for up to 5 days for severe pain. 01/08/21 01/13/21 Yes Lucrezia Starch, MD  predniSONE (DELTASONE) 10 MG tablet Take 4 tablets (40 mg total) by mouth daily for 2 days, THEN 3 tablets (30 mg total) daily for 2 days, THEN 2 tablets (20 mg total) daily for 2 days, THEN 1 tablet (10 mg total) daily for 2 days. 01/08/21 01/16/21 Yes Lucrezia Starch, MD  allopurinol (ZYLOPRIM) 100 MG tablet Take 100 mg by mouth daily.    [provider]  apixaban (ELIQUIS) 5 MG TABS tablet Take 1 tablet (5 mg total) by mouth 2 (two) times daily. 10/08/20 12/09/20  [name], MD  atorvastatin (LIPITOR) 80 MG tablet Take 1 tablet (80 mg total) by mouth daily. 10/08/20   Marrianne Mood D, PA-C  carvedilol (COREG) 12.5 MG tablet Take 1  tablet (12.5 mg total) by mouth 2 (two) times daily with a meal. 12/09/20   Gollan, Kathlene November, MD  dapagliflozin propanediol (FARXIGA) 10 MG TABS tablet Take 1 tablet (10 mg total) by mouth daily before breakfast. 12/09/20   Gollan, Kathlene November, MD  ferrous gluconate (FERGON) 324 MG tablet Take 324 mg by mouth daily with breakfast.    [provider]  furosemide (LASIX) 40 MG tablet Take 1 tablet (40 mg total) by mouth daily. 10/08/20 02/05/21  Marrianne Mood D, PA-C  hydrALAZINE (APRESOLINE) 25 MG tablet Take 1 tablet (25 mg total) by mouth 3 (three) times daily. 10/08/20 04/06/21  Marrianne Mood D, PA-C  levothyroxine (SYNTHROID) 100 MCG tablet Take 100 mcg by mouth daily before breakfast.    [provider]    Allergies Esomeprazole magnesium and Eggs or egg-derived products  Family History  Problem Relation Age of Onset   Hypertension Mother    Hyperlipidemia Mother    Diabetes Mother  Social History Social History   Tobacco Use   Smoking status: Former    Packs/day: 1.00    Years: 12.00    Pack years: 12.00    Types: Cigarettes    Quit date: 02/23/1998    Years since quitting: 22.8   Smokeless tobacco: Never  Vaping Use   Vaping Use: Never used  Substance Use Topics   Alcohol use: Not Currently    Alcohol/week: 0.0 standard drinks    Comment: Past heavy drinker   Drug use: No    Review of Systems  Review of Systems  Constitutional:  Negative for chills and fever.  HENT:  Negative for sore throat.   Eyes:  Negative for pain.  Respiratory:  Negative for cough and stridor.   Cardiovascular:  Negative for chest pain.  Gastrointestinal:  Negative for vomiting.  Musculoskeletal:  Positive for joint pain (L elbow).  Skin:  Negative for rash.  Neurological:  Positive for sensory change (chronic in L forearm and L hand). Negative for seizures, loss of consciousness and headaches.  Psychiatric/Behavioral:  Negative for suicidal ideas.   All other  systems reviewed and are negative.    ____________________________________________   PHYSICAL EXAM:  VITAL SIGNS: ED Triage Vitals  Enc Vitals Group     BP 01/08/21 0824 (!) 143/99     Pulse Rate 01/08/21 0824 70     Resp 01/08/21 0824 20     Temp 01/08/21 0824 99.4 F (37.4 C)     Temp Source 01/08/21 0824 Oral     SpO2 01/08/21 0824 99 %     Weight 01/08/21 0823 256 lb (116.1 kg)     Height 01/08/21 0823 _0  (1.803 m)     Head Circumference --      Peak Flow --      Pain Score 01/08/21 0823 10     Pain Loc --      Pain Edu? --      Excl. in [place]? --    Vitals:   01/08/21 0824  BP: (!) 143/99  Pulse: 70  Resp: 20  Temp: 99.4 F (37.4 C)  SpO2: 99%   Physical Exam Vitals and nursing note reviewed.  Constitutional:      Appearance: He is well-developed. He is obese.  HENT:     Head: Normocephalic and atraumatic.     Right Ear: External ear normal.     Left Ear: External ear normal.     Nose: Nose normal.  Eyes:     Conjunctiva/sclera: Conjunctivae normal.  Cardiovascular:     Rate and Rhythm: Normal rate and regular rhythm.     Heart sounds: No murmur heard. Pulmonary:     Effort: Pulmonary effort is normal. No respiratory distress.     Breath sounds: Normal breath sounds.  Abdominal:     Palpations: Abdomen is soft.     Tenderness: There is no abdominal tenderness.  Musculoskeletal:     Cervical back: Neck supple.  Skin:    General: Skin is warm and dry.  Neurological:     Mental Status: He is alert and oriented to person, place, and time.  Psychiatric:        Mood and Affect: Mood normal.    Patient is feels examiner in the distribution of the radial ulnar and median nerves in the left hand only states it is significantly less than the right hand.  He is symmetric grip strength in left hand compared to the right.  There  is some edema around the left elbow and a small effusion very limited range of motion secondary to pain on exam.  The left shoulder  has no effusion tenderness or overlying skin changes.  There is no visible erythema induration over the elbow.  Chest and back as well as neck are unremarkable.  2+ bilateral radial pulses. ____________________________________________   LABS (all labs ordered are listed, but only abnormal results are displayed)  Labs Reviewed  CBC WITH DIFFERENTIAL/PLATELET - Abnormal; Notable for the following components:      Result Value   WBC 12.3 (*)    Hemoglobin 11.9 (*)    HCT 38.3 (*)    MCV 77.8 (*)    MCH 24.2 (*)    RDW 18.6 (*)    Neutro Abs 9.9 (*)    All other components within normal limits  BASIC METABOLIC PANEL - Abnormal; Notable for the following components:   Glucose, Bld 137 (*)    BUN 37 (*)    Creatinine, Ser 2.76 (*)    GFR, Estimated 26 (*)    All other components within normal limits  SYNOVIAL CELL COUNT + DIFF, W/ CRYSTALS - Abnormal; Notable for the following components:   Color, Synovial YELLOW (*)    Appearance-Synovial TURBID (*)    WBC, Synovial 465,035 (*)    All other components within normal limits  BODY FLUID CULTURE W GRAM STAIN  SEDIMENTATION RATE  C-REACTIVE PROTEIN  GLUCOSE, BODY FLUID OTHER            PROTEIN, BODY FLUID (OTHER)   ____________________________________________  EKG  ____________________________________________  RADIOLOGY  ED MD interpretation: Plain film of the left elbow shows large effusion without any fracture or dislocation.  Official radiology report(s): DG Elbow Complete Left  Result Date: 01/08/2021 CLINICAL DATA:  Elbow pain.  History of gout.  No recent trauma EXAM: LEFT ELBOW - COMPLETE 3+ VIEW COMPARISON:  02/03/2020 FINDINGS: Elevation of the anterior and posterior fat pads of the elbow indicative of a large left elbow joint effusion. No evidence of acute fracture. No malalignment. Similar degree of joint space narrowing. Mild diffuse soft tissue swelling. IMPRESSION: 1. Large left elbow joint effusion. In the absence  of trauma, this finding is nonspecific and can be associated with an underlying inflammatory or crystalline arthropathy including gout. 2. No evidence of acute fracture or malalignment. Electronically Signed   By: Davina Poke D.O.   On: 01/08/2021 09:09    ____________________________________________   PROCEDURES  Procedure(s) performed (including Critical Care):  .Joint Aspiration/Arthrocentesis  Date/Time: 01/08/2021 10:01 AM Performed by: Lucrezia Starch, MD Authorized by: Lucrezia Starch, MD   Consent:    Consent obtained:  Verbal   Consent given by:  Patient   Risks, benefits, and alternatives were discussed: yes     Risks discussed:  Bleeding, infection, pain and incomplete drainage   Alternatives discussed:  No treatment Universal protocol:    Procedure explained and questions answered to patient or proxy's satisfaction: yes     Patient identity confirmed:  Verbally with patient Location:    Location:  Elbow   Elbow:  L elbow Anesthesia:    Anesthesia method:  Local infiltration   Local anesthetic:  Lidocaine 2% WITH epi Procedure details:    Needle gauge:  18 G   Ultrasound guidance: no     Approach:  Lateral   Aspirate characteristics:  Yellow   Steroid injected: no     Specimen collected: yes   Post-procedure  details:    Dressing:  Adhesive bandage   Procedure completion:  Tolerated well, no immediate complications   ____________________________________________   INITIAL IMPRESSION / ASSESSMENT AND PLAN / ED COURSE      Patient presents with above-stated history exam for assessment of some nontraumatic acute left elbow pain in the setting of chronic decree sensation in left forearm and hand from what patient was told with some cervical radiculopathy.  On arrival patient is slightly hypertensive with otherwise stable vital signs on room air.  On exam he does have decreased sensation to light touch in the left forearm and hand compared to the right but  the left elbow is very tender with very limited range of motion apparently secondary to pain and appears to have a small effusion and some edema.  Otherwise 2+ bilateral radial pulses.  Differential includes possible bursitis, septic joint, tendinitis, cellulitis and possible hemarthrosis given patient is anticoagulated.   Plain film of the left elbow shows large effusion without any fracture or dislocation.  BMP remarkable for no significant acute electrolyte metabolic derangements.  Kidney function is at baseline for surveillance of CKD.  CBC with WBC count of 12.3 without evidence of acute anemia and normal platelets.  ESR is 11.  Synovial fluid cell count shows 233,000 WBCs with intracellular monosodium urate crystals seen.  96% neutrophils.  This is consistent with an acute gout arthropathy I have a lower suspicion for septic joint at this time.  Patient given dose of prednisone and below noted analgesia emergency room.  Given the degree of swelling will provide a sling for comfort and a course of Percocet and steroid taper as patient's kidney function I think makes him not a good candidate for colchicine at this time.  In addition he is not a good candidate for NSAID therapy.  Discharged in stable condition.  Strict return precautions advised and discussed.      ____________________________________________   FINAL CLINICAL IMPRESSION(S) / ED DIAGNOSES  Final diagnoses:  Acute gout of left elbow, unspecified cause    Medications  predniSONE (DELTASONE) tablet 50 mg (has no administration in time range)  oxyCODONE-acetaminophen (PERCOCET/ROXICET) 5-325 MG per tablet 1 tablet (1 tablet Oral Given 01/08/21 0910)  acetaminophen (TYLENOL) tablet 650 mg (650 mg Oral Given 01/08/21 0909)  lidocaine-EPINEPHrine (XYLOCAINE W/EPI) 2 %-1:100000 (with pres) injection 20 mL (20 mLs Infiltration Given by Other 01/08/21 1005)  fentaNYL (SUBLIMAZE) injection 50 mcg (50 mcg Intravenous Given 01/08/21  0950)     ED Discharge Orders          Ordered    oxyCODONE-acetaminophen (PERCOCET) 5-325 MG tablet  Every 8 hours PRN        01/08/21 1146    predniSONE (DELTASONE) 10 MG tablet        01/08/21 1151             Note:  This document was prepared using Dragon voice recognition software and may include unintentional dictation errors.    Lucrezia Starch, MD 01/08/21 1153

## 2021-01-08 NOTE — ED Triage Notes (Signed)
Pt c/o left elbow pain since yesterday. Hx pinched nerve.  Appt tomorrow but states couldn't wait d/t pain.  NAD noted

## 2021-01-08 NOTE — ED Notes (Signed)
Pt to ED c/o L elbow pain. Pt states it started swelling yesterday and pain has increased since. Pt also has pinched nerve in neck.  Upon assessment L elbow is swollen, painful and warm to touch.   Pt is A&ox4, NAD

## 2021-01-09 LAB — GLUCOSE, BODY FLUID OTHER: Glucose, Body Fluid Other: 17 mg/dL

## 2021-01-09 LAB — PROTEIN, BODY FLUID (OTHER): Total Protein, Body Fluid Other: 4.1 g/dL

## 2021-01-11 LAB — BODY FLUID CULTURE W GRAM STAIN
Culture: NO GROWTH
Gram Stain: NONE SEEN

## 2021-01-22 ENCOUNTER — Other Ambulatory Visit: Payer: Self-pay | Admitting: Neurology

## 2021-01-22 DIAGNOSIS — R200 Anesthesia of skin: Secondary | ICD-10-CM

## 2021-02-01 ENCOUNTER — Ambulatory Visit: Payer: Medicaid Other

## 2021-02-14 ENCOUNTER — Ambulatory Visit: Payer: Medicaid Other

## 2021-02-14 ENCOUNTER — Ambulatory Visit: Admission: RE | Admit: 2021-02-14 | Payer: Medicaid Other | Source: Ambulatory Visit

## 2021-02-26 ENCOUNTER — Emergency Department: Payer: Medicaid Other

## 2021-02-26 ENCOUNTER — Encounter: Payer: Self-pay | Admitting: Emergency Medicine

## 2021-02-26 ENCOUNTER — Emergency Department
Admission: EM | Admit: 2021-02-26 | Discharge: 2021-02-26 | Disposition: A | Discharge status: Home / Self Care | Payer: Medicaid Other | Attending: Emergency Medicine | Admitting: Emergency Medicine

## 2021-02-26 ENCOUNTER — Other Ambulatory Visit: Payer: Self-pay

## 2021-02-26 DIAGNOSIS — N189 Chronic kidney disease, unspecified: Secondary | ICD-10-CM | POA: Insufficient documentation

## 2021-02-26 DIAGNOSIS — R110 Nausea: Secondary | ICD-10-CM | POA: Diagnosis not present

## 2021-02-26 DIAGNOSIS — R1033 Periumbilical pain: Secondary | ICD-10-CM | POA: Diagnosis not present

## 2021-02-26 DIAGNOSIS — R1030 Lower abdominal pain, unspecified: Secondary | ICD-10-CM

## 2021-02-26 LAB — URINALYSIS, ROUTINE W REFLEX MICROSCOPIC
Bilirubin Urine: NEGATIVE
Glucose, UA: 150 mg/dL — AB
Ketones, ur: NEGATIVE mg/dL
Nitrite: NEGATIVE
Protein, ur: >=300 mg/dL — AB
Specific Gravity, Urine: 1.015 (ref 1.005–1.030)
WBC, UA: >50 WBC/hpf — ABNORMAL HIGH (ref 0–5)
pH: 5 (ref 5.0–8.0)

## 2021-02-26 LAB — COMPREHENSIVE METABOLIC PANEL
ALT: 10 U/L (ref 0–44)
AST: 12 U/L — ABNORMAL LOW (ref 15–41)
Albumin: 3.9 g/dL (ref 3.5–5.0)
Alkaline Phosphatase: 85 U/L (ref 38–126)
Anion gap: 13 (ref 5–15)
BUN: 53 mg/dL — ABNORMAL HIGH (ref 6–20)
CO2: 18 mmol/L — ABNORMAL LOW (ref 22–32)
Calcium: 8.9 mg/dL (ref 8.9–10.3)
Chloride: 106 mmol/L (ref 98–111)
Creatinine, Ser: 3.23 mg/dL — ABNORMAL HIGH (ref 0.61–1.24)
GFR, Estimated: 22 mL/min — ABNORMAL LOW (ref >60)
Glucose, Bld: 135 mg/dL — ABNORMAL HIGH (ref 70–99)
Potassium: 4.4 mmol/L (ref 3.5–5.1)
Sodium: 137 mmol/L (ref 135–145)
Total Bilirubin: 2.1 mg/dL — ABNORMAL HIGH (ref 0.3–1.2)
Total Protein: 7 g/dL (ref 6.5–8.1)

## 2021-02-26 LAB — CBC
HCT: 44.7 % (ref 39.0–52.0)
Hemoglobin: 13.6 g/dL (ref 13.0–17.0)
MCH: 24.8 pg — ABNORMAL LOW (ref 26.0–34.0)
MCHC: 30.4 g/dL (ref 30.0–36.0)
MCV: 81.4 fL (ref 80.0–100.0)
Platelets: 242 10*3/uL (ref 150–400)
RBC: 5.49 MIL/uL (ref 4.22–5.81)
RDW: 20.7 % — ABNORMAL HIGH (ref 11.5–15.5)
WBC: 9.9 10*3/uL (ref 4.0–10.5)
nRBC: 0 % (ref 0.0–0.2)

## 2021-02-26 LAB — LIPASE, BLOOD: Lipase: 27 U/L (ref 11–51)

## 2021-02-26 IMAGING — CT CT ABD-PELV W/O CM
2 of 4 series · 16 of 46 positions shown, 18 images · non-contrast
Comparison: [DATE]

CLINICAL DATA: Abdominal pain and vomiting.

EXAM:
CT ABDOMEN AND PELVIS WITHOUT CONTRAST
TECHNIQUE: Multidetector CT imaging of the abdomen and pelvis was performed
following the standard protocol without IV contrast.

[Series 2: routine abd/pel wo · axial · 0.98mm/px · z∈[-986,-526]mm · 13 of 101 slices shown, 15 images]
[im 5/101  soft-tissue]
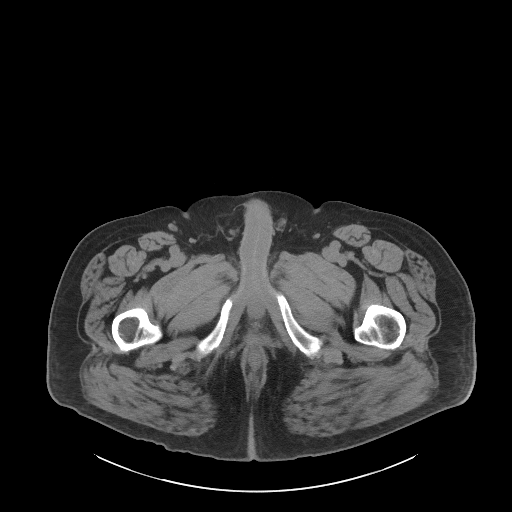
[im 5/101  bone]
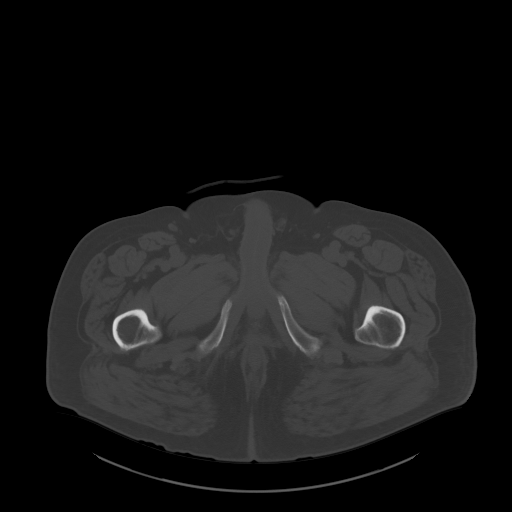
[im 13/101  soft-tissue]
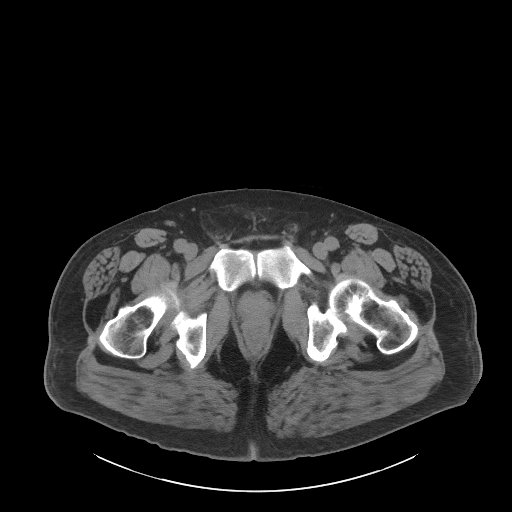
[im 21/101  soft-tissue]
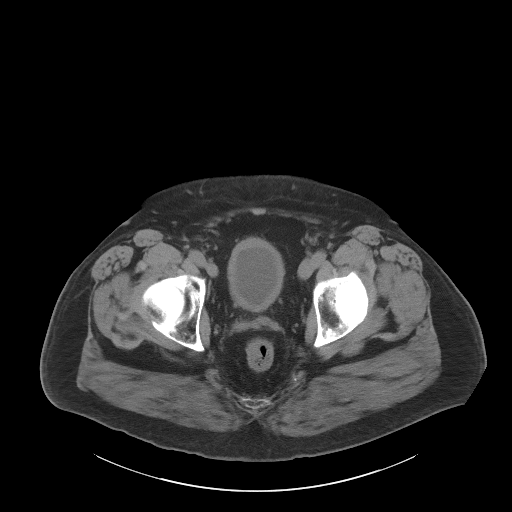
[im 29/101  soft-tissue]
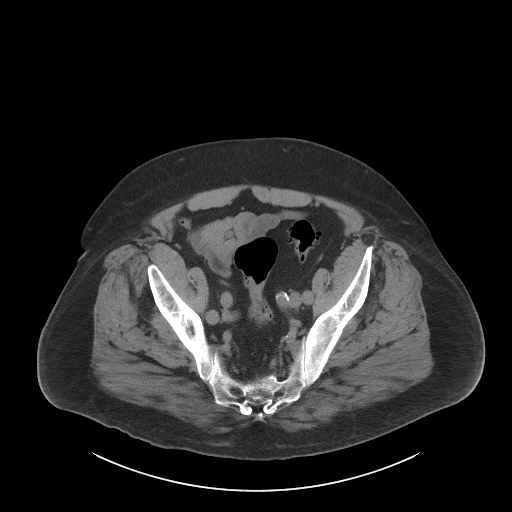
[im 37/101  soft-tissue]
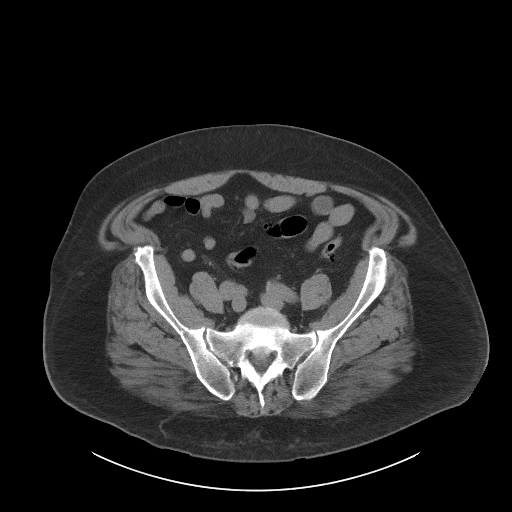
[im 45/101  soft-tissue]
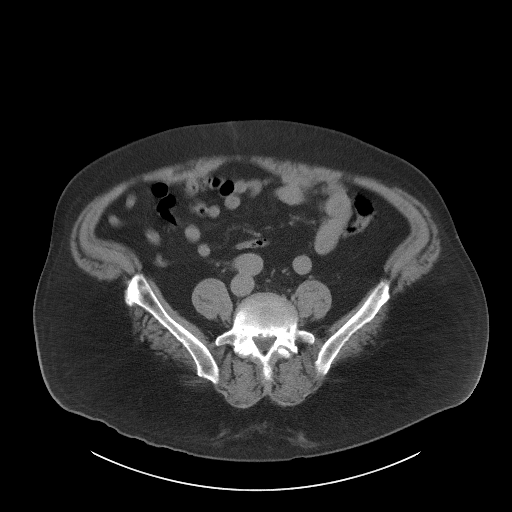
[im 53/101  soft-tissue]
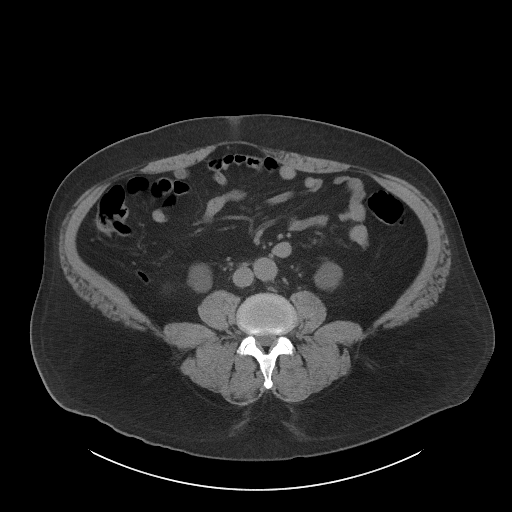
[im 57/101  soft-tissue]
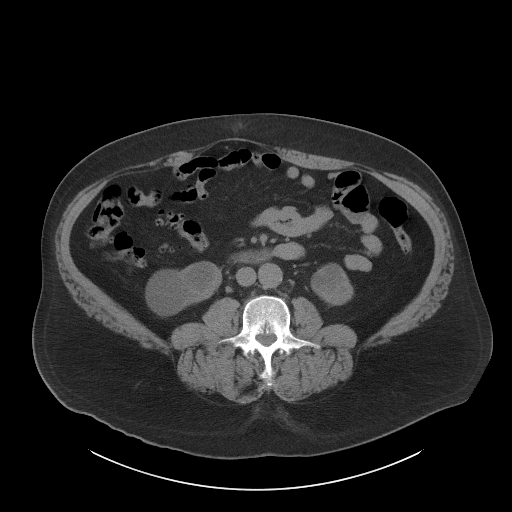
[im 65/101  soft-tissue]
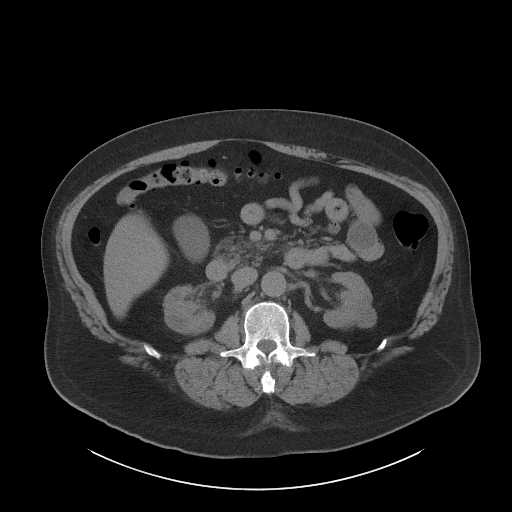
[im 65/101  bone]
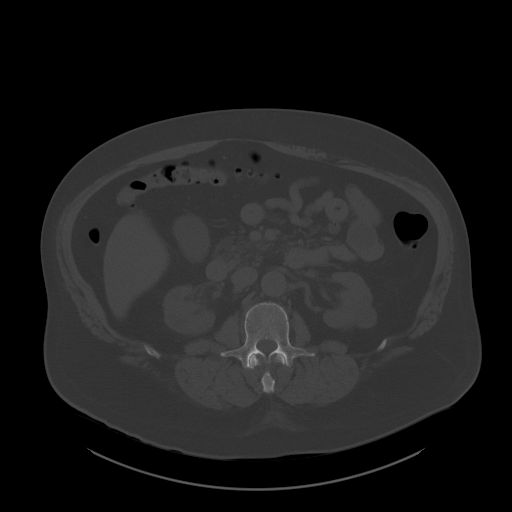
[im 73/101  soft-tissue]
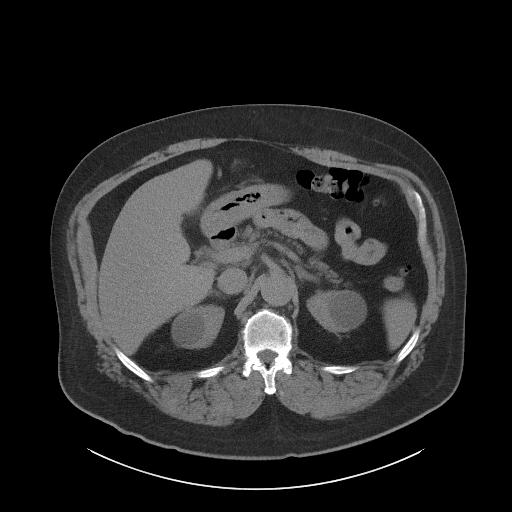
[im 81/101  soft-tissue]
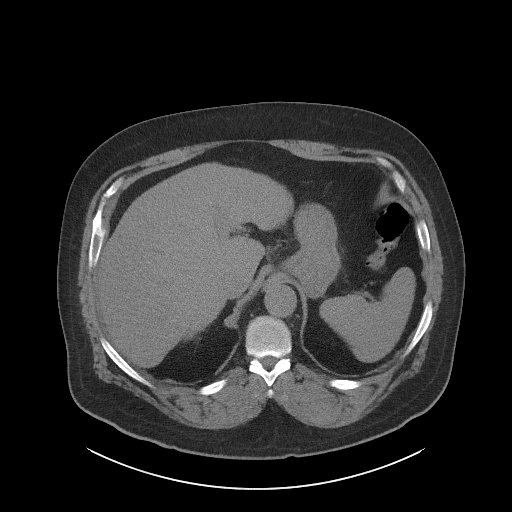
[im 89/101  soft-tissue]
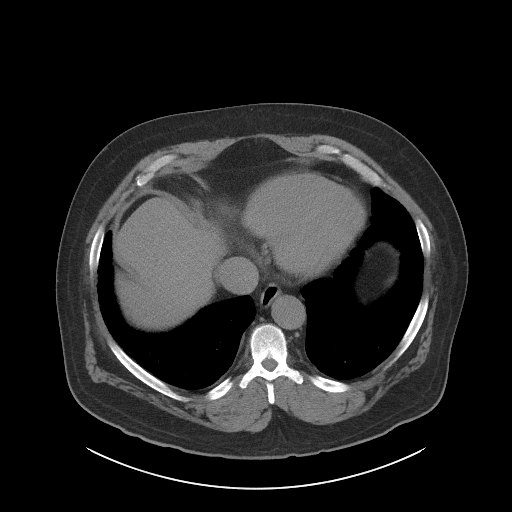
[im 97/101  soft-tissue]
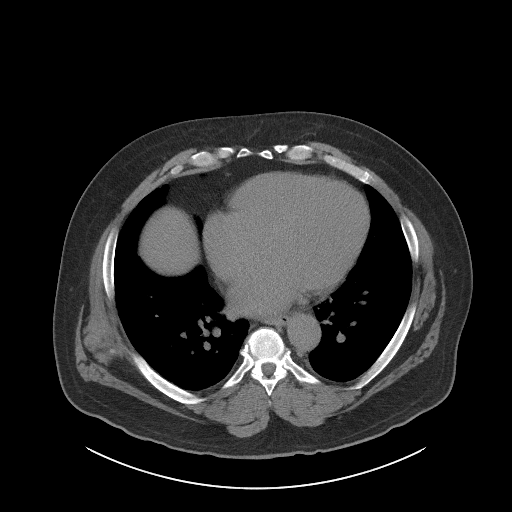

[Series 5: coronal st · coronal · 0.88mm/px · 3 of 118 slices shown]
[im 40/118  soft-tissue]
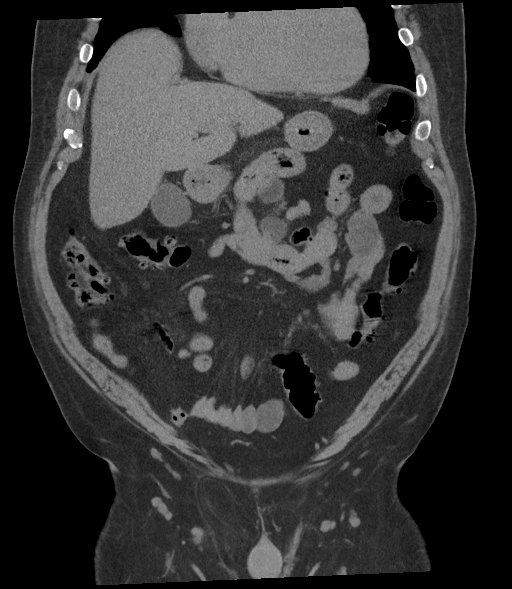
[im 53/118  soft-tissue]
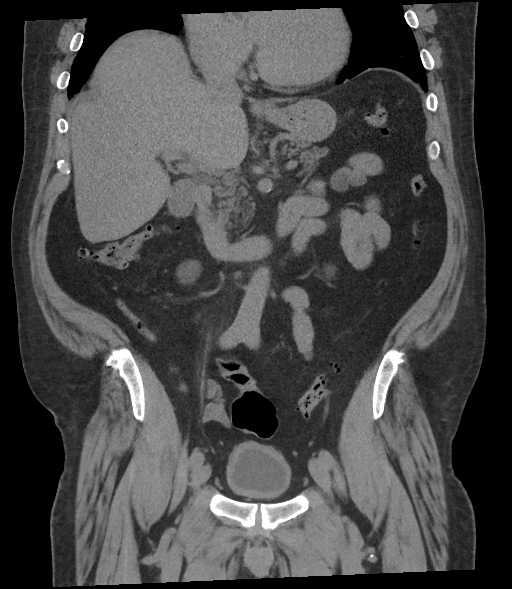
[im 66/118  soft-tissue]
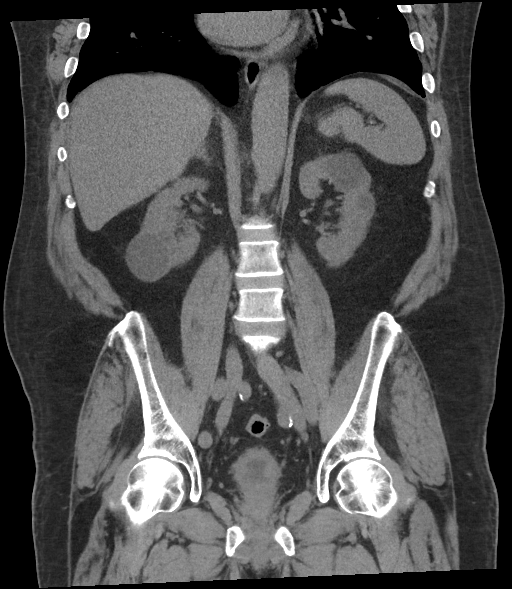

[16 of 46 positions shown; findings below may reference images not displayed]

FINDINGS: Lower chest: No acute abnormality. There is interlobular septal
thickening identified within the visualized lung bases. Scar is
noted within the right middle lobe. Posterior right lower lobe
granuloma identified, image [DATE]. Interlobular septal thickening
within the periphery of the right lower lobe is likely
postinflammatory.

Hepatobiliary: No focal liver abnormality identified. Stone within
the dependent portion of the gallbladder measures 3 mm. No
gallbladder wall thickening or inflammation. No bile duct
dilatation.

Pancreas: Unremarkable. No pancreatic ductal dilatation or
surrounding inflammatory changes.

Spleen: Normal in size without focal abnormality.

Adrenals/Urinary Tract: Normal adrenal glands. Bilateral kidney
cysts are again noted. The largest arises off the lateral cortex of
the interpolar right kidney measuring 5.3 cm. No kidney stones or
hydronephrosis identified bilaterally. No hydroureter or ureteral
calculi. Partially decompressed urinary bladder is identified with
circumferential wall thickening.

Stomach/Bowel: The stomach appears within normal limits. The
appendix is visualized and appears within normal limits. Colonic
diverticulosis identified. No signs of acute diverticulitis. No
bowel wall thickening, inflammation or distension.

Vascular/Lymphatic: Mild aortic atherosclerosis. No aneurysm. No
abdominopelvic adenopathy identified.

Reproductive: Prostate is unremarkable.

Other: No abdominal wall hernia or abnormality. No abdominopelvic
ascites.

Musculoskeletal: No acute or significant osseous findings.
IMPRESSION: 1. No acute findings identified within the abdomen or pelvis.
2. There is mild interlobular septal thickening within the
visualized lung bases which may reflect early pulmonary edema.
Correlate for clinical signs or symptoms of CHF.
3. Bilateral kidney cysts.
4. Gallstone.
5. Aortic Atherosclerosis ([VJ]-[VJ]).

## 2021-02-26 IMAGING — DX DG CHEST 1V PORT
2 series · 2 of 2 positions shown · non-contrast
Comparison: Chest radiograph dated [DATE]

CLINICAL DATA: Edema

EXAM:
PORTABLE CHEST 1 VIEW

[chest ap (1 of 2)]
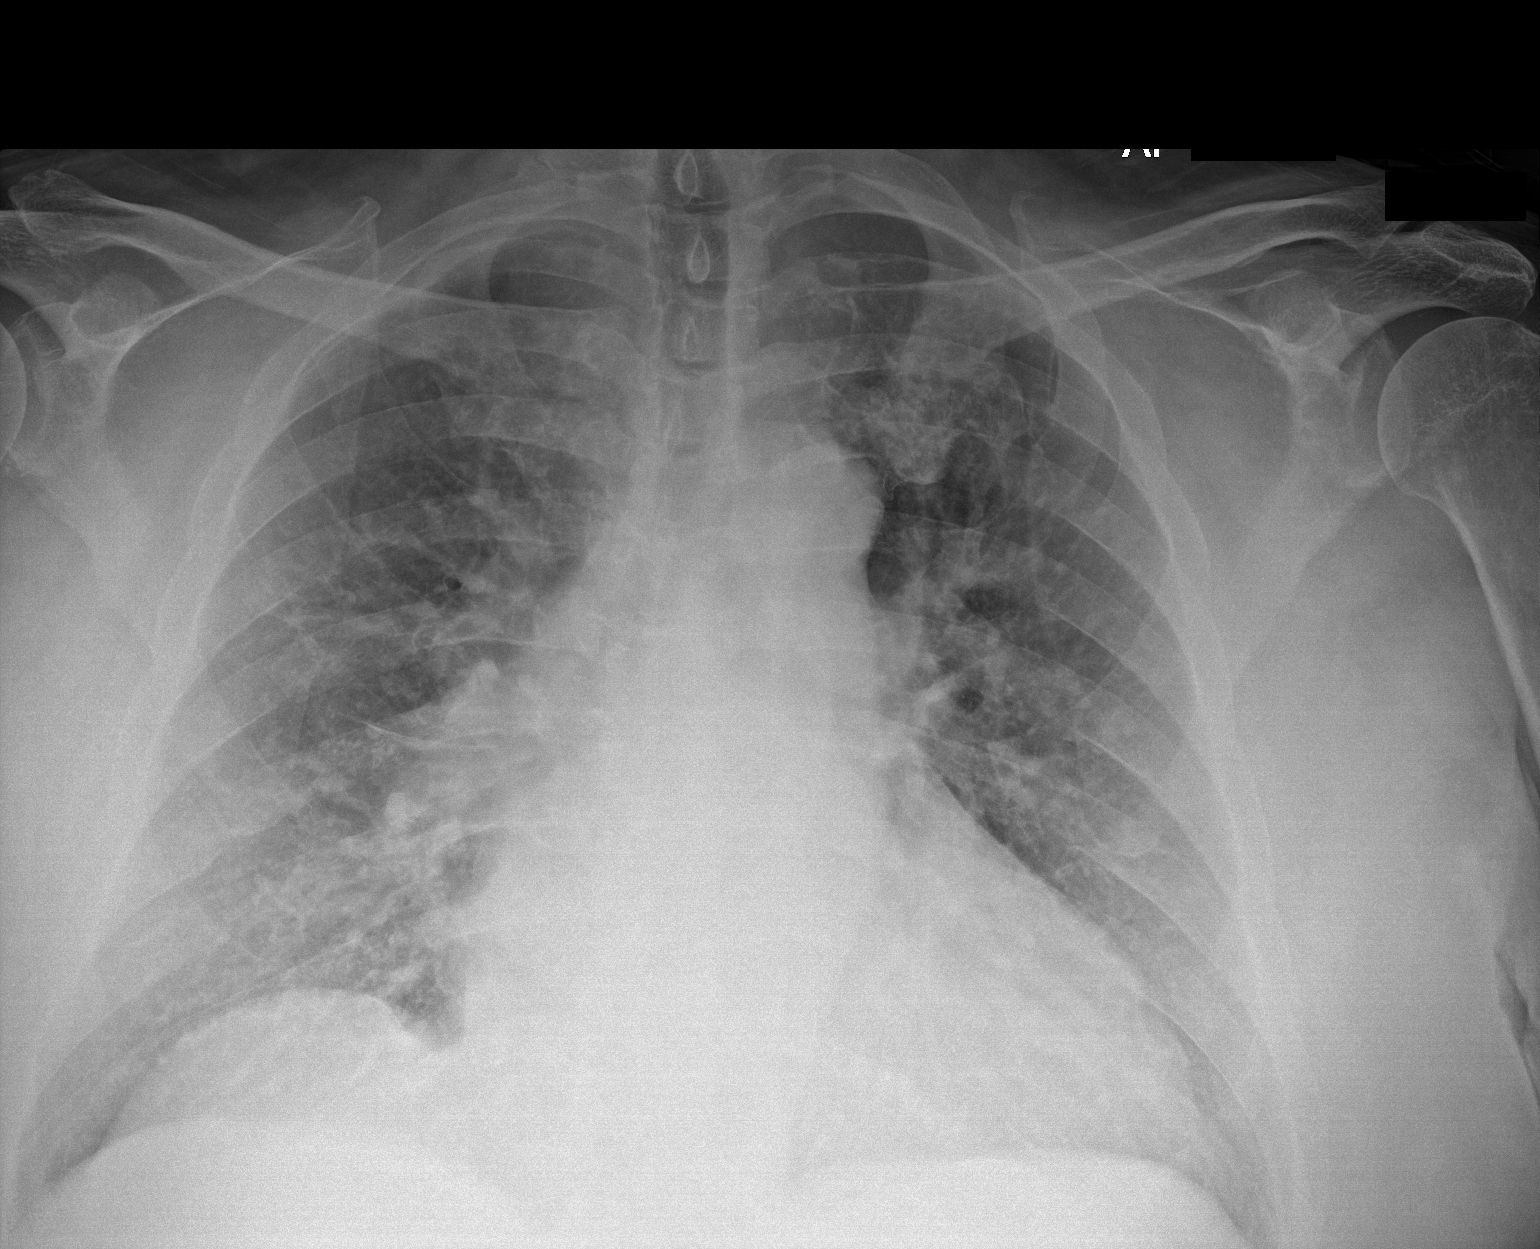

[chest ap (2 of 2)]
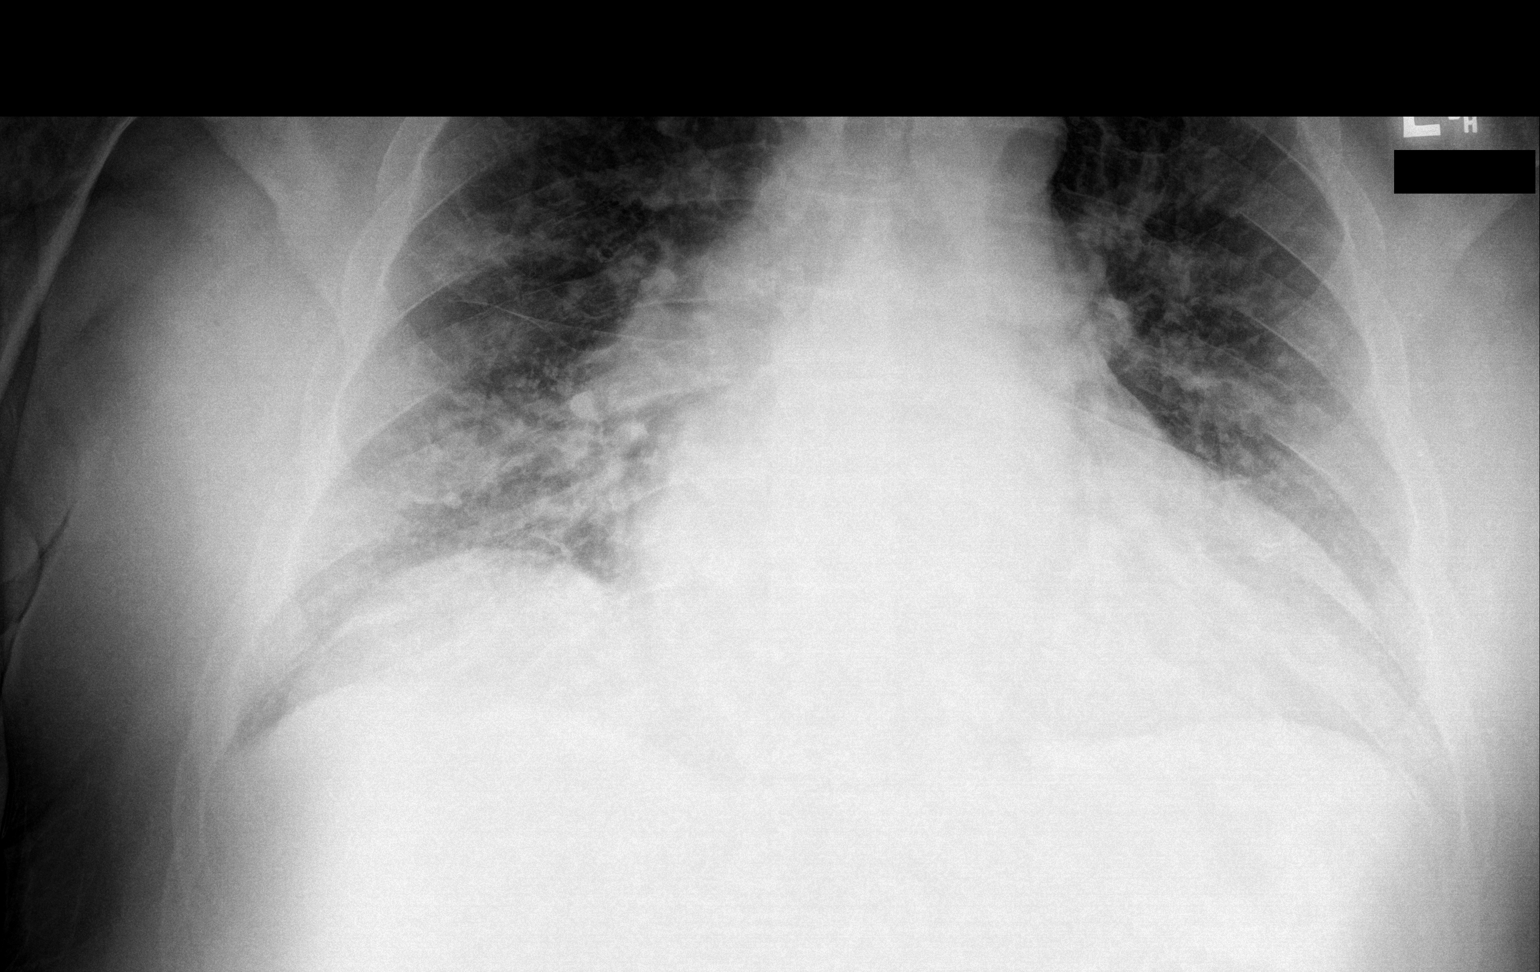

[2 of 2 positions shown; findings below may reference images not displayed]

FINDINGS: The heart is enlarged. Pulmonary vascular congestion without
evidence of frank pulmonary edema. No pleural effusion or
pneumothorax. Eventration of the right hemidiaphragm, unchanged.
IMPRESSION: Cardiomegaly with pulmonary vascular congestion.

## 2021-02-26 MED ORDER — SODIUM CHLORIDE 0.9 % IV BOLUS
500.0000 mL | Freq: Once | INTRAVENOUS | Status: AC
  Administered 2021-02-26: 500 mL via INTRAVENOUS

## 2021-02-26 MED ORDER — ONDANSETRON HCL 4 MG/2ML IJ SOLN
4.0000 mg | Freq: Once | INTRAMUSCULAR | Status: AC
  Administered 2021-02-26: 4 mg via INTRAVENOUS
  Filled 2021-02-26: qty 2

## 2021-02-26 MED ORDER — MORPHINE SULFATE (PF) 4 MG/ML IV SOLN
4.0000 mg | Freq: Once | INTRAVENOUS | Status: AC
  Administered 2021-02-26: 4 mg via INTRAVENOUS
  Filled 2021-02-26: qty 1

## 2021-02-26 MED ORDER — OXYCODONE-ACETAMINOPHEN 5-325 MG PO TABS: 1.0000 | ORAL_TABLET | Freq: Three times a day (TID) | ORAL | 0 refills | Status: DC | PRN

## 2021-02-26 NOTE — ED Provider Notes (Signed)
[facility] Provider Note    Event Date/Time   First MD Initiated Contact with Patient 02/26/21 1216     (approximate)   History   Abdominal Pain   HPI  Hector [name] is a 56 y.o. male who presents with complaints of periumbilical and lower abdominal pain.  Patient describes the pain as cramping in nature, he reports it has been present for approximately 3 days now.  He reports it is intermittent.  Some nausea.  Normal stools.  No fevers reported.  No history of abdominal surgery.  Has not take anything for this.     Physical Exam   Triage Vital Signs: ED Triage Vitals  Enc Vitals Group     BP 02/26/21 1024 (!) 136/113     Pulse Rate 02/26/21 1024 61     Resp 02/26/21 1024 18     Temp 02/26/21 1024 98.1 F (36.7 C)     Temp Source 02/26/21 1024 Oral     SpO2 02/26/21 1024 96 %     Weight 02/26/21 1023 116.1 kg (255 lb 15.3 oz)     Height 02/26/21 1023 1.803 m (5\' 11" )     Head Circumference --      Peak Flow --      Pain Score 02/26/21 1023 8     Pain Loc --      Pain Edu? --      Excl. in [place]? --     Most recent vital signs: Vitals:   02/26/21 1437 02/26/21 1442  BP:  (!) 137/116  Pulse:  (!) 121  Resp:  18  Temp: 98 F (36.7 C) 98 F (36.7 C)  SpO2:  97%     General: Awake, no distress.  CV:  Good peripheral perfusion.  Resp:  Normal effort.  Abd:  No distention.  Mild left lower quadrant tenderness to palpation, nonsurgical abdomen Other:     ED Results / Procedures / Treatments   Labs (all labs ordered are listed, but only abnormal results are displayed) Labs Reviewed  COMPREHENSIVE METABOLIC PANEL - Abnormal; Notable for the following components:      Result Value   CO2 18 (*)    Glucose, Bld 135 (*)    BUN 53 (*)    Creatinine, Ser 3.23 (*)    AST 12 (*)    Total Bilirubin 2.1 (*)    GFR, Estimated 22 (*)    All other components within normal limits  CBC - Abnormal; Notable for the following components:    MCH 24.8 (*)    RDW 20.7 (*)    All other components within normal limits  URINALYSIS, ROUTINE W REFLEX MICROSCOPIC - Abnormal; Notable for the following components:   Color, Urine YELLOW (*)    APPearance CLOUDY (*)    Glucose, UA 150 (*)    Hgb urine dipstick SMALL (*)    Protein, ur >=300 (*)    Leukocytes,Ua LARGE (*)    WBC, UA >50 (*)    Bacteria, UA FEW (*)    Non Squamous Epithelial PRESENT (*)    All other components within normal limits  LIPASE, BLOOD     EKG     RADIOLOGY CT abdomen pelvis reviewed by me, no acute abnormality    PROCEDURES:  Critical Care performed:   Procedures   MEDICATIONS ORDERED IN ED: Medications  morphine 4 MG/ML injection 4 mg (4 mg Intravenous Given 02/26/21 1302)  ondansetron (ZOFRAN) injection 4 mg (  4 mg Intravenous Given 02/26/21 1301)  sodium chloride 0.9 % bolus 500 mL (0 mLs Intravenous Stopped 02/26/21 1446)  morphine 4 MG/ML injection 4 mg (4 mg Intravenous Given 02/26/21 1438)     IMPRESSION / MDM / ASSESSMENT AND PLAN / ED COURSE  I reviewed the triage vital signs and the nursing notes.   Differential diagnosis includes, but is not limited to, diverticulitis, ureterolithiasis, colitis  Patient presents with abdominal pain as detailed above, suspicious for diverticulitis.  Will treat with IV morphine, IV Zofran, IV fluids and sent for CT abdomen pelvis  Has a history of chronic kidney disease, his creatinine is elevated so we will do a Noncon scan   Lab work is notable for elevated BUN and creatinine  CBC is normal, lipase is normal  Patient feeling better after morphine, CT scan performed, no acute abnormality.  Discussed admission with the patient however he has decided to trial analgesics and discharge  He agrees to return the emergency department for any worsening or symptoms not improved within 24 hours       FINAL CLINICAL IMPRESSION(S) / ED DIAGNOSES   Final diagnoses:  Lower abdominal pain   Chronic kidney disease, unspecified CKD stage     Rx / DC Orders   ED Discharge Orders          Ordered    oxyCODONE-acetaminophen (PERCOCET) 5-325 MG tablet  Every 8 hours PRN        02/26/21 1447             Note:  This document was prepared using Dragon voice recognition software and may include unintentional dictation errors.   Lavonia Drafts, MD 02/26/21 1500

## 2021-02-26 NOTE — ED Triage Notes (Signed)
Pt comes with c/o vomiting and abdominal pain since Monday.  HR-121 141/107 97.6 98% RA

## 2021-02-26 NOTE — Discharge Instructions (Addendum)
Please follow-up with your kidney doctor to recheck your kidney levels  If your abdominal pain does not continue to improve please return to the emergency department

## 2021-02-28 ENCOUNTER — Other Ambulatory Visit: Payer: Self-pay

## 2021-02-28 ENCOUNTER — Emergency Department: Payer: Medicaid Other

## 2021-02-28 DIAGNOSIS — Z20822 Contact with and (suspected) exposure to covid-19: Secondary | ICD-10-CM | POA: Diagnosis present

## 2021-02-28 DIAGNOSIS — I426 Alcoholic cardiomyopathy: Secondary | ICD-10-CM | POA: Diagnosis present

## 2021-02-28 DIAGNOSIS — I4892 Unspecified atrial flutter: Secondary | ICD-10-CM | POA: Diagnosis present

## 2021-02-28 DIAGNOSIS — E669 Obesity, unspecified: Secondary | ICD-10-CM | POA: Diagnosis present

## 2021-02-28 DIAGNOSIS — F1020 Alcohol dependence, uncomplicated: Secondary | ICD-10-CM | POA: Diagnosis present

## 2021-02-28 DIAGNOSIS — I248 Other forms of acute ischemic heart disease: Secondary | ICD-10-CM | POA: Diagnosis not present

## 2021-02-28 DIAGNOSIS — Z79890 Hormone replacement therapy (postmenopausal): Secondary | ICD-10-CM

## 2021-02-28 DIAGNOSIS — K8020 Calculus of gallbladder without cholecystitis without obstruction: Secondary | ICD-10-CM | POA: Diagnosis present

## 2021-02-28 DIAGNOSIS — I130 Hypertensive heart and chronic kidney disease with heart failure and stage 1 through stage 4 chronic kidney disease, or unspecified chronic kidney disease: Principal | ICD-10-CM | POA: Diagnosis present

## 2021-02-28 DIAGNOSIS — Z7984 Long term (current) use of oral hypoglycemic drugs: Secondary | ICD-10-CM

## 2021-02-28 DIAGNOSIS — Z8673 Personal history of transient ischemic attack (TIA), and cerebral infarction without residual deficits: Secondary | ICD-10-CM

## 2021-02-28 DIAGNOSIS — N1832 Chronic kidney disease, stage 3b: Secondary | ICD-10-CM | POA: Diagnosis present

## 2021-02-28 DIAGNOSIS — G4733 Obstructive sleep apnea (adult) (pediatric): Secondary | ICD-10-CM | POA: Diagnosis present

## 2021-02-28 DIAGNOSIS — Z8249 Family history of ischemic heart disease and other diseases of the circulatory system: Secondary | ICD-10-CM

## 2021-02-28 DIAGNOSIS — E039 Hypothyroidism, unspecified: Secondary | ICD-10-CM | POA: Diagnosis present

## 2021-02-28 DIAGNOSIS — Z7901 Long term (current) use of anticoagulants: Secondary | ICD-10-CM

## 2021-02-28 DIAGNOSIS — I160 Hypertensive urgency: Secondary | ICD-10-CM | POA: Diagnosis present

## 2021-02-28 DIAGNOSIS — N179 Acute kidney failure, unspecified: Secondary | ICD-10-CM | POA: Diagnosis present

## 2021-02-28 DIAGNOSIS — I480 Paroxysmal atrial fibrillation: Secondary | ICD-10-CM | POA: Diagnosis present

## 2021-02-28 DIAGNOSIS — Z888 Allergy status to other drugs, medicaments and biological substances status: Secondary | ICD-10-CM

## 2021-02-28 DIAGNOSIS — Z79899 Other long term (current) drug therapy: Secondary | ICD-10-CM

## 2021-02-28 DIAGNOSIS — Z83438 Family history of other disorder of lipoprotein metabolism and other lipidemia: Secondary | ICD-10-CM

## 2021-02-28 DIAGNOSIS — I5023 Acute on chronic systolic (congestive) heart failure: Secondary | ICD-10-CM | POA: Diagnosis present

## 2021-02-28 DIAGNOSIS — Z87891 Personal history of nicotine dependence: Secondary | ICD-10-CM

## 2021-02-28 DIAGNOSIS — Z9114 Patient's other noncompliance with medication regimen: Secondary | ICD-10-CM

## 2021-02-28 DIAGNOSIS — Z91012 Allergy to eggs: Secondary | ICD-10-CM

## 2021-02-28 DIAGNOSIS — I428 Other cardiomyopathies: Secondary | ICD-10-CM | POA: Diagnosis present

## 2021-02-28 DIAGNOSIS — E785 Hyperlipidemia, unspecified: Secondary | ICD-10-CM | POA: Diagnosis present

## 2021-02-28 DIAGNOSIS — E1122 Type 2 diabetes mellitus with diabetic chronic kidney disease: Secondary | ICD-10-CM | POA: Diagnosis present

## 2021-02-28 DIAGNOSIS — Z833 Family history of diabetes mellitus: Secondary | ICD-10-CM

## 2021-02-28 DIAGNOSIS — I2510 Atherosclerotic heart disease of native coronary artery without angina pectoris: Secondary | ICD-10-CM | POA: Diagnosis present

## 2021-02-28 DIAGNOSIS — Z6835 Body mass index (BMI) 35.0-35.9, adult: Secondary | ICD-10-CM

## 2021-02-28 LAB — COMPREHENSIVE METABOLIC PANEL
ALT: 10 U/L (ref 0–44)
AST: 12 U/L — ABNORMAL LOW (ref 15–41)
Albumin: 3.7 g/dL (ref 3.5–5.0)
Alkaline Phosphatase: 76 U/L (ref 38–126)
Anion gap: 10 (ref 5–15)
BUN: 48 mg/dL — ABNORMAL HIGH (ref 6–20)
CO2: 18 mmol/L — ABNORMAL LOW (ref 22–32)
Calcium: 8.7 mg/dL — ABNORMAL LOW (ref 8.9–10.3)
Chloride: 108 mmol/L (ref 98–111)
Creatinine, Ser: 3.15 mg/dL — ABNORMAL HIGH (ref 0.61–1.24)
GFR, Estimated: 22 mL/min — ABNORMAL LOW (ref >60)
Glucose, Bld: 93 mg/dL (ref 70–99)
Potassium: 4.3 mmol/L (ref 3.5–5.1)
Sodium: 136 mmol/L (ref 135–145)
Total Bilirubin: 2.1 mg/dL — ABNORMAL HIGH (ref 0.3–1.2)
Total Protein: 6.9 g/dL (ref 6.5–8.1)

## 2021-02-28 LAB — CBC
HCT: 46.4 % (ref 39.0–52.0)
Hemoglobin: 13.7 g/dL (ref 13.0–17.0)
MCH: 24.6 pg — ABNORMAL LOW (ref 26.0–34.0)
MCHC: 29.5 g/dL — ABNORMAL LOW (ref 30.0–36.0)
MCV: 83.5 fL (ref 80.0–100.0)
Platelets: 251 10*3/uL (ref 150–400)
RBC: 5.56 MIL/uL (ref 4.22–5.81)
RDW: 20.8 % — ABNORMAL HIGH (ref 11.5–15.5)
WBC: 7.8 10*3/uL (ref 4.0–10.5)
nRBC: 0 % (ref 0.0–0.2)

## 2021-02-28 LAB — TROPONIN I (HIGH SENSITIVITY)
Troponin I (High Sensitivity): 46 ng/L — ABNORMAL HIGH (ref <18)
Troponin I (High Sensitivity): 54 ng/L — ABNORMAL HIGH (ref <18)

## 2021-02-28 LAB — LIPASE, BLOOD: Lipase: 26 U/L (ref 11–51)

## 2021-02-28 IMAGING — CR DG CHEST 2V
1 series · 2 of 2 positions shown · non-contrast
Comparison: Radiograph 2 days ago [DATE]

CLINICAL DATA: Generalized abdominal pain. Evaluate for worsening
pulmonary edema.

EXAM:
CHEST - 2 VIEW

[Series 1: dg chest 2 view · 0.14mm/px · 2 of 2 slices shown]
[im 1/2]
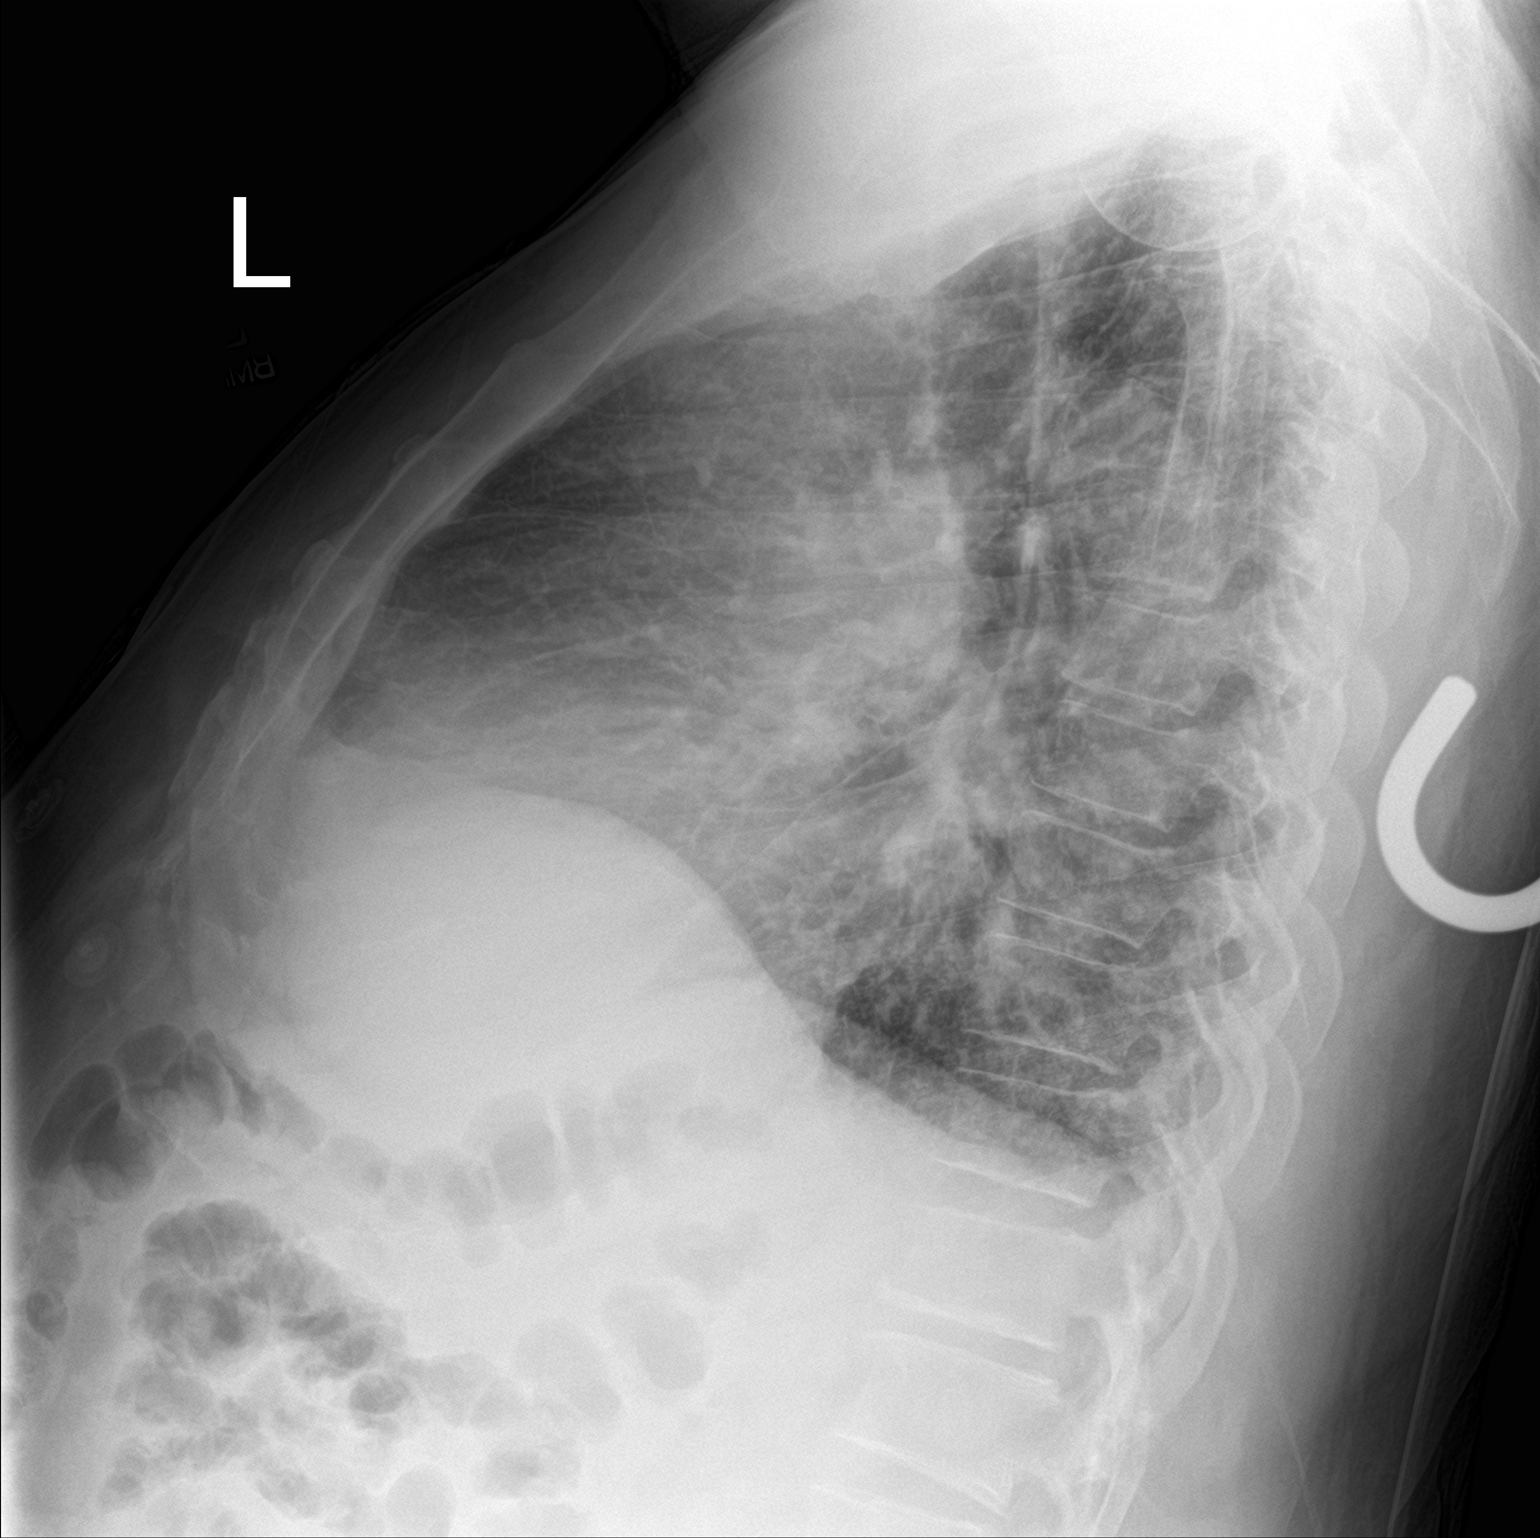
[im 2/2]
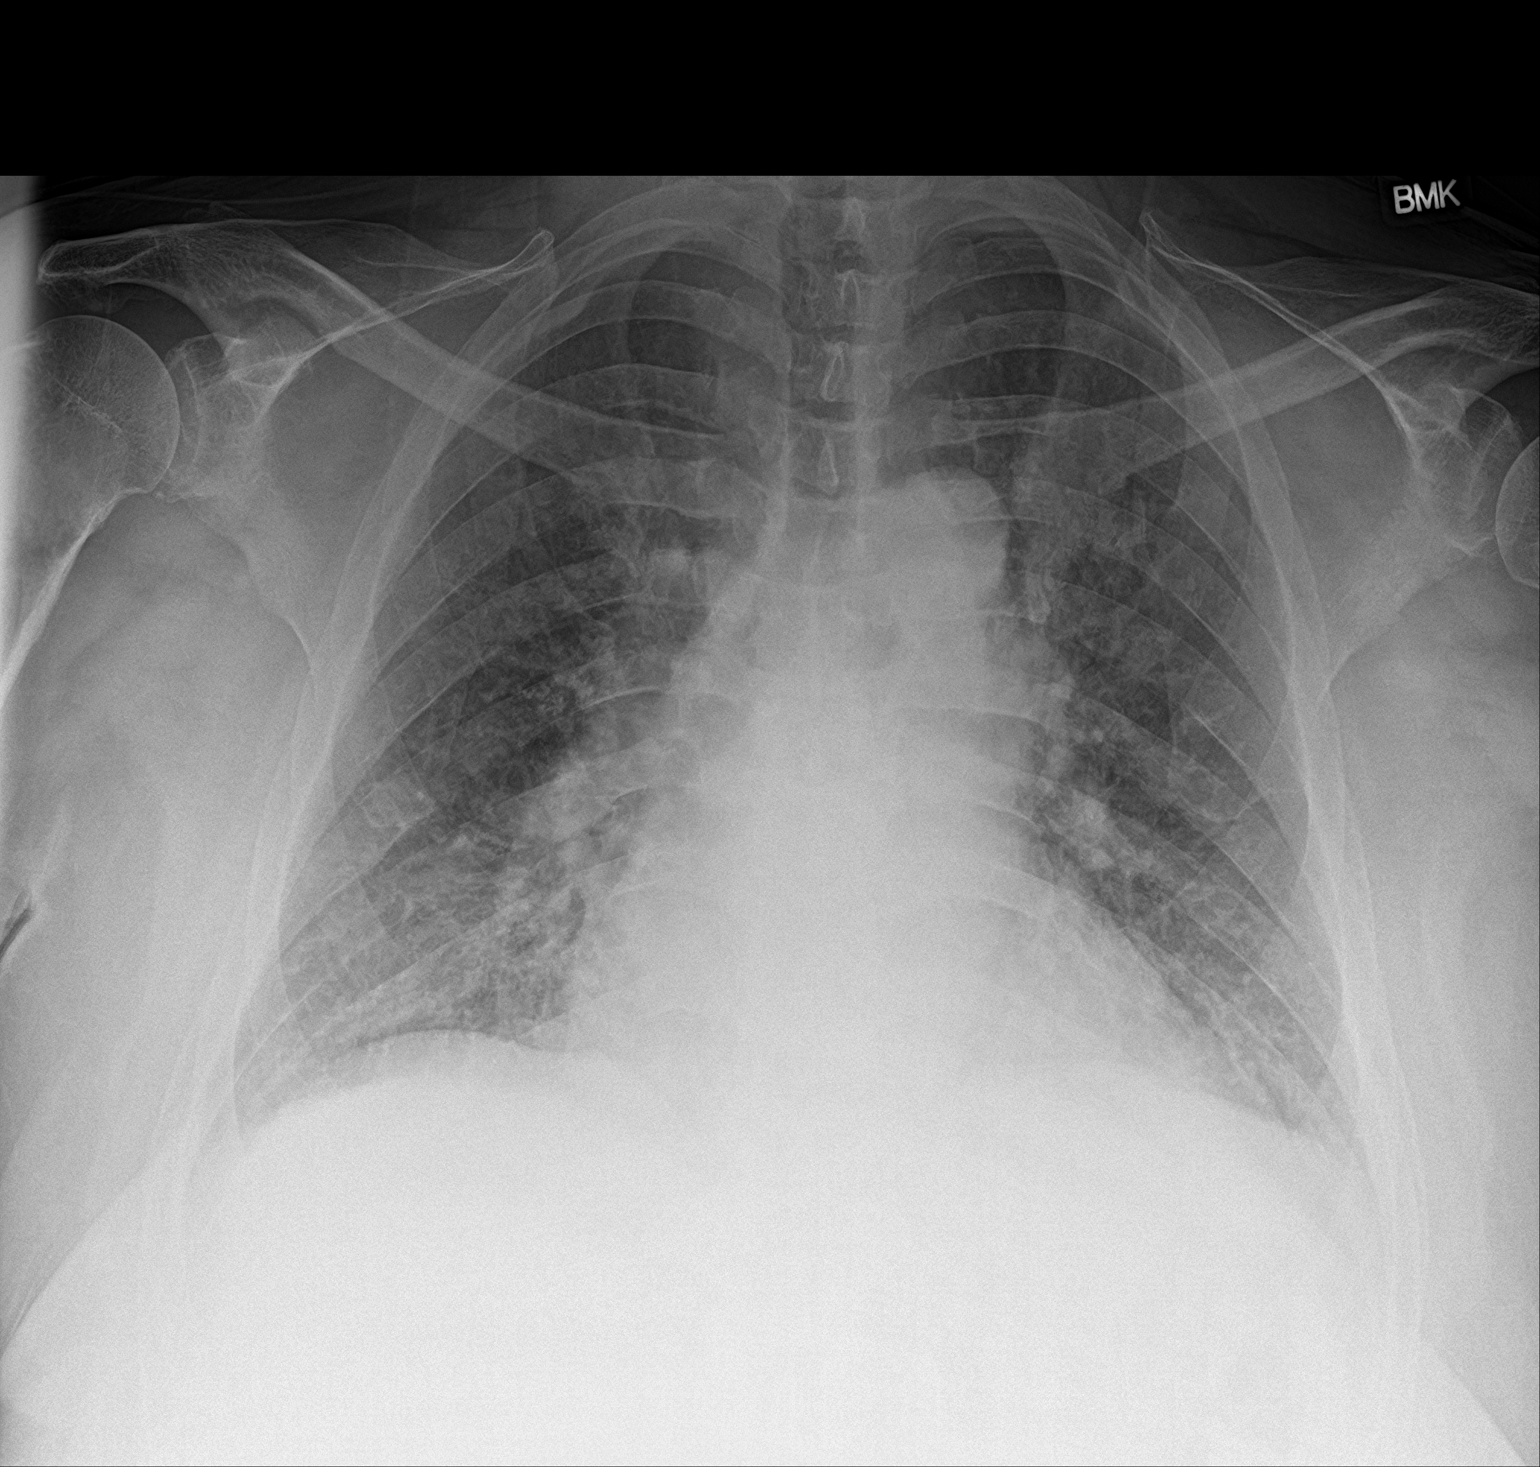

[2 of 2 positions shown; findings below may reference images not displayed]

FINDINGS: Unchanged cardiomegaly. Pulmonary edema is similar to prior exam. No
significant progression. No confluent consolidation. Trace fluid in
the fissures without significant sub pulmonic effusion. No
pneumothorax. Stable osseous structures.
IMPRESSION: 1. Unchanged cardiomegaly.
2. Unchanged pulmonary edema without progression.

## 2021-02-28 NOTE — ED Triage Notes (Signed)
Pt complains of generalized abd pain since Monday with bloating. Pt states was seen yesterday for same and given pain medication that did not help. Pt states last bm was Tuesday.

## 2021-02-28 NOTE — ED Notes (Signed)
Pt to ED via [facility] from Home for abdominal pain x 1 week. Seen yesterday for same. Told to come back if pain got worse.   156/111 80 95-97%

## 2021-03-01 ENCOUNTER — Other Ambulatory Visit: Payer: Self-pay

## 2021-03-01 ENCOUNTER — Inpatient Hospital Stay
Admission: EM | Admit: 2021-03-01 | Discharge: 2021-03-06 | DRG: 291 | Disposition: A | Discharge status: Home / Self Care | Payer: Medicaid Other | Attending: Internal Medicine | Admitting: Internal Medicine

## 2021-03-01 DIAGNOSIS — I4892 Unspecified atrial flutter: Secondary | ICD-10-CM | POA: Diagnosis not present

## 2021-03-01 DIAGNOSIS — I426 Alcoholic cardiomyopathy: Secondary | ICD-10-CM | POA: Diagnosis present

## 2021-03-01 DIAGNOSIS — N179 Acute kidney failure, unspecified: Secondary | ICD-10-CM | POA: Diagnosis not present

## 2021-03-01 DIAGNOSIS — I428 Other cardiomyopathies: Secondary | ICD-10-CM

## 2021-03-01 DIAGNOSIS — F1020 Alcohol dependence, uncomplicated: Secondary | ICD-10-CM | POA: Diagnosis present

## 2021-03-01 DIAGNOSIS — Z87891 Personal history of nicotine dependence: Secondary | ICD-10-CM | POA: Diagnosis not present

## 2021-03-01 DIAGNOSIS — I130 Hypertensive heart and chronic kidney disease with heart failure and stage 1 through stage 4 chronic kidney disease, or unspecified chronic kidney disease: Secondary | ICD-10-CM | POA: Diagnosis present

## 2021-03-01 DIAGNOSIS — R778 Other specified abnormalities of plasma proteins: Secondary | ICD-10-CM | POA: Diagnosis not present

## 2021-03-01 DIAGNOSIS — Z8249 Family history of ischemic heart disease and other diseases of the circulatory system: Secondary | ICD-10-CM | POA: Diagnosis not present

## 2021-03-01 DIAGNOSIS — I340 Nonrheumatic mitral (valve) insufficiency: Secondary | ICD-10-CM | POA: Diagnosis not present

## 2021-03-01 DIAGNOSIS — G4733 Obstructive sleep apnea (adult) (pediatric): Secondary | ICD-10-CM | POA: Diagnosis present

## 2021-03-01 DIAGNOSIS — R109 Unspecified abdominal pain: Secondary | ICD-10-CM | POA: Diagnosis not present

## 2021-03-01 DIAGNOSIS — E669 Obesity, unspecified: Secondary | ICD-10-CM | POA: Diagnosis present

## 2021-03-01 DIAGNOSIS — E1122 Type 2 diabetes mellitus with diabetic chronic kidney disease: Secondary | ICD-10-CM | POA: Diagnosis present

## 2021-03-01 DIAGNOSIS — I480 Paroxysmal atrial fibrillation: Secondary | ICD-10-CM | POA: Diagnosis present

## 2021-03-01 DIAGNOSIS — I509 Heart failure, unspecified: Secondary | ICD-10-CM

## 2021-03-01 DIAGNOSIS — I5023 Acute on chronic systolic (congestive) heart failure: Secondary | ICD-10-CM

## 2021-03-01 DIAGNOSIS — N189 Chronic kidney disease, unspecified: Secondary | ICD-10-CM

## 2021-03-01 DIAGNOSIS — E039 Hypothyroidism, unspecified: Secondary | ICD-10-CM | POA: Diagnosis present

## 2021-03-01 DIAGNOSIS — N1831 Chronic kidney disease, stage 3a: Secondary | ICD-10-CM

## 2021-03-01 DIAGNOSIS — I361 Nonrheumatic tricuspid (valve) insufficiency: Secondary | ICD-10-CM | POA: Diagnosis not present

## 2021-03-01 DIAGNOSIS — I10 Essential (primary) hypertension: Secondary | ICD-10-CM | POA: Diagnosis not present

## 2021-03-01 DIAGNOSIS — I2510 Atherosclerotic heart disease of native coronary artery without angina pectoris: Secondary | ICD-10-CM | POA: Diagnosis present

## 2021-03-01 DIAGNOSIS — N1832 Chronic kidney disease, stage 3b: Secondary | ICD-10-CM | POA: Diagnosis present

## 2021-03-01 DIAGNOSIS — K8020 Calculus of gallbladder without cholecystitis without obstruction: Secondary | ICD-10-CM | POA: Diagnosis present

## 2021-03-01 DIAGNOSIS — Z20822 Contact with and (suspected) exposure to covid-19: Secondary | ICD-10-CM | POA: Diagnosis present

## 2021-03-01 DIAGNOSIS — Z6835 Body mass index (BMI) 35.0-35.9, adult: Secondary | ICD-10-CM | POA: Diagnosis not present

## 2021-03-01 DIAGNOSIS — I248 Other forms of acute ischemic heart disease: Secondary | ICD-10-CM | POA: Diagnosis not present

## 2021-03-01 DIAGNOSIS — Z8673 Personal history of transient ischemic attack (TIA), and cerebral infarction without residual deficits: Secondary | ICD-10-CM | POA: Diagnosis not present

## 2021-03-01 DIAGNOSIS — E785 Hyperlipidemia, unspecified: Secondary | ICD-10-CM | POA: Diagnosis present

## 2021-03-01 DIAGNOSIS — Z9114 Patient's other noncompliance with medication regimen: Secondary | ICD-10-CM | POA: Diagnosis not present

## 2021-03-01 DIAGNOSIS — I160 Hypertensive urgency: Secondary | ICD-10-CM | POA: Diagnosis present

## 2021-03-01 LAB — RESP PANEL BY RT-PCR (FLU A&B, COVID) ARPGX2
Influenza A by PCR: NEGATIVE
Influenza B by PCR: NEGATIVE
SARS Coronavirus 2 by RT PCR: NEGATIVE

## 2021-03-01 LAB — BRAIN NATRIURETIC PEPTIDE: B Natriuretic Peptide: 1449.1 pg/mL — ABNORMAL HIGH (ref 0.0–100.0)

## 2021-03-01 MED ORDER — FUROSEMIDE 10 MG/ML IJ SOLN
40.0000 mg | Freq: Two times a day (BID) | INTRAMUSCULAR | Status: DC
Start: 2021-03-01 — End: 2021-03-03
  Administered 2021-03-01 – 2021-03-03 (×5): 40 mg via INTRAVENOUS
  Filled 2021-03-01 (×5): qty 4

## 2021-03-01 MED ORDER — ONDANSETRON HCL 4 MG/2ML IJ SOLN
4.0000 mg | Freq: Once | INTRAMUSCULAR | Status: AC
  Administered 2021-03-01: 4 mg via INTRAVENOUS
  Filled 2021-03-01: qty 2

## 2021-03-01 MED ORDER — DILTIAZEM LOAD VIA INFUSION
10.0000 mg | Freq: Once | INTRAVENOUS | Status: DC
  Filled 2021-03-01: qty 10

## 2021-03-01 MED ORDER — ACETAMINOPHEN 650 MG RE SUPP: 650.0000 mg | Freq: Four times a day (QID) | RECTAL | Status: DC | PRN

## 2021-03-01 MED ORDER — MORPHINE SULFATE (PF) 4 MG/ML IV SOLN
4.0000 mg | Freq: Once | INTRAVENOUS | Status: AC
  Administered 2021-03-01: 4 mg via INTRAVENOUS
  Filled 2021-03-01: qty 1

## 2021-03-01 MED ORDER — CARVEDILOL 3.125 MG PO TABS
12.5000 mg | ORAL_TABLET | Freq: Two times a day (BID) | ORAL | Status: DC
  Administered 2021-03-01 – 2021-03-03 (×5): 12.5 mg via ORAL
  Filled 2021-03-01 (×3): qty 2
  Filled 2021-03-01 (×3): qty 4

## 2021-03-01 MED ORDER — ACETAMINOPHEN 325 MG PO TABS: 650.0000 mg | ORAL_TABLET | Freq: Four times a day (QID) | ORAL | Status: DC | PRN

## 2021-03-01 MED ORDER — APIXABAN 5 MG PO TABS
5.0000 mg | ORAL_TABLET | Freq: Two times a day (BID) | ORAL | Status: DC
  Administered 2021-03-01 – 2021-03-06 (×11): 5 mg via ORAL
  Filled 2021-03-01 (×11): qty 1

## 2021-03-01 MED ORDER — DAPAGLIFLOZIN PROPANEDIOL 10 MG PO TABS
10.0000 mg | ORAL_TABLET | Freq: Every day | ORAL | Status: DC
  Administered 2021-03-01 – 2021-03-03 (×3): 10 mg via ORAL
  Filled 2021-03-01 (×3): qty 1

## 2021-03-01 MED ORDER — GABAPENTIN 300 MG PO CAPS
300.0000 mg | ORAL_CAPSULE | Freq: Two times a day (BID) | ORAL | Status: DC
  Administered 2021-03-01 – 2021-03-06 (×11): 300 mg via ORAL
  Filled 2021-03-01 (×11): qty 1

## 2021-03-01 MED ORDER — ATORVASTATIN CALCIUM 80 MG PO TABS
80.0000 mg | ORAL_TABLET | Freq: Every day | ORAL | Status: DC
  Administered 2021-03-01 – 2021-03-05 (×5): 80 mg via ORAL
  Filled 2021-03-01: qty 4
  Filled 2021-03-01 (×3): qty 1
  Filled 2021-03-01: qty 4

## 2021-03-01 MED ORDER — FUROSEMIDE 10 MG/ML IJ SOLN
40.0000 mg | Freq: Once | INTRAMUSCULAR | Status: AC
  Administered 2021-03-01: 40 mg via INTRAVENOUS
  Filled 2021-03-01: qty 4

## 2021-03-01 MED ORDER — DILTIAZEM HCL-DEXTROSE 125-5 MG/125ML-% IV SOLN (PREMIX): 5.0000 mg/h | INTRAVENOUS | Status: DC

## 2021-03-01 NOTE — H&P (Signed)
History and Physical    Hector [name] XTK:[id number] DOB: 23-May-1965 DOA: 03/01/2021  PCP: [name], MD   Patient coming from: home  I have personally briefly reviewed patient's relevant medical records in [place]  Chief Complaint: abdominal pain  HPI: Hector [name] is a 56 y.o. 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 flutter not currently on anticoagulation, CKD lllb,  hx of GI bleed 2015, OSA, Presents to the emergency room for the second time in 2 days with a complaint of abdominal pain and distention.  On his first visit on 1/4 he had a CT abdomen that was nonacute though did show suggestion of possible CHF.  He was treated with pain medicine and discharge.  He returns now complaining of shortness of breath and sensation of increased abdominal distention with pain.  He denies nausea and vomiting.  Denies cough, fever or chills.  ED course: On arrival tachycardic to 125 with BP as high as 143/120 O2 sat was 98% on room air with desaturations to 90% with exertion Blood work: Troponin 46-54 with a BNP 1449 Creatinine 3.15, up from baseline of 2.6 CBC unremarkable  EKG with rapid a flutter at 124  Chest x-ray compared to 02/26/2021: Unchanged cardiomegaly.  Unchanged pulmonary edema without progression  Patient treated with a dose of IV Lasix given a diltiazem bolus and infusion.  Hospitalist consulted for admission.  Review of Systems: As per HPI otherwise all other systems on review of systems negative.   Assessment/Plan    Atrial flutter with rapid ventricular response (HCC) -Continue diltiazem infusion started in the ED - Continue apixaban - Cardiology consult    Acute on chronic systolic CHF (congestive heart failure) ([place])   Nonischemic cardiomyopathy: - EF 30 to 35% in September 2022 - IV Lasix - Continue  carvedilol.  No ACE inhibitors due to renal insufficiency - Daily weights with intake and output monitoring  Elevated troponin, suspect demand ischemia Nonobstructive CAD - Denies chest pain.  EKG without acute ST-T wave changes - Continue atorvastatin, carvedilol    Acute renal failure superimposed on stage 3b chronic kidney disease ([place]) - Monitor renal function, given IV Lasix - Avoid nephrotoxins  Hypertensive urgency - Blood pressure control  Hypothyroidism - Continue levothyroxine   DVT prophylaxis: Apixaban Code Status: full code  Family Communication:  none  Disposition Plan: Back to previous home environment Consults called: Cardiology Status:At the time of admission, it appears that the appropriate admission status for this patient is INPATIENT. This is judged to be reasonable and necessary in order to provide the required intensity of service to ensure the patient's safety given the presenting symptoms, physical exam findings, and initial radiographic and laboratory data in the context of their  Comorbid conditions.   Patient requires inpatient status due to high intensity of service, high risk for further deterioration and high frequency of surveillance required.   I certify that at the point of admission it is my clinical judgment that the patient will require inpatient hospital care spanning beyond 2 midnights     Physical Exam: Vitals:   03/01/21 0145 03/01/21 0200 03/01/21 0230 03/01/21 0300  BP: 130/89 (!) 117/93 (!) 137/118 (!) 143/120  Pulse: (!) 122 (!) 120 (!) 122 (!) 125  Resp: 18 18 12 14   Temp:      TempSrc:      SpO2: 95% 93% 95%  97%  Weight:      Height:       Constitutional: Alert, oriented x 3 . Not in any apparent distress HEENT:      Head: Normocephalic and atraumatic.         Eyes: PERLA, EOMI, Conjunctivae are normal. Sclera is non-icteric.       Mouth/Throat: Mucous membranes are moist.       Neck: Supple with no signs of  meningismus. Cardiovascular: Regular rate and rhythm. No murmurs, gallops, or rubs. 2+ symmetrical distal pulses are present . No JVD. 2+ LE edema Respiratory: Respiratory effort normal .few bibasilar rales gastrointestinal: Soft, non tender, non distended. Positive bowel sounds.  Genitourinary: No CVA tenderness. Musculoskeletal: Nontender with normal range of motion in all extremities. No cyanosis, or erythema of extremities. Neurologic:  Face is symmetric. Moving all extremities. No gross focal neurologic deficits . Skin: Skin is warm, dry.  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 12/2007 MV: EF 28%, no isch;  b. 8/12 Echo: EF 25-35%; c. 02/2014 Echo: EF 20-25%; d. 12/2014 Cath: minimal CAD; e. 01/2015 Echo: EF 50-55%;  d. 05/2016 Echo: EF 30-35%, diff HK, gr2 DD; e. 11/2016 Echo: EF 45-50%, diff HK.   Chronic combined systolic (congestive) and diastolic (congestive) heart failure ([place])    a. 05/2016 Most recent Echo: EF 30-35%, diff HK, Gr2 DD, mild MR, mod dil LA; b. 11/2016 Echo: EF 45-50%, diff HK.   CKD (chronic kidney disease), stage III (HCC)    Elevated troponin    Chronically elevated. - level was 0.17-0.10 during recent admission   Essential hypertension    GI bleed 11/2013   Hyperlipidemia    Pancreatitis    Paroxysmal A-fib (HCC)    a. new onset s/p unsuccessful TEE/DCCV on 08/16/2014; b. on amio/eliquis (CHA2DS2VASc = 2-3); c. reports 1-2 hrs of afib ~ q52mos.   Paroxysmal atrial flutter ([place])    a. new onset 07/2014; b. s/p unsuccessful TEE/DCCV 08/16/2014; c. on apixaban.   Sleep-disordered breathing    Has yet to have a sleep study   Stroke [facility])     Past Surgical History:  Procedure Laterality Date   CARDIAC CATHETERIZATION N/A 01/09/2015   Procedure: Left Heart Cath and Coronary Angiography;  Surgeon: [name], MD;  Location: [place] CV LAB;  Service: Cardiovascular;  Laterality: N/A;   COLONOSCOPY N/A 07/22/2020   Procedure: COLONOSCOPY;  Surgeon: Toledo, Benay Pike, MD;  Location: [facility] ENDOSCOPY;  Service: Gastroenterology;  Laterality: N/A;   ELECTROPHYSIOLOGIC STUDY N/A 08/16/2014   Procedure: CARDIOVERSION;  Surgeon: [name], MD;  Location: [facility] ORS;  Service: Cardiovascular;  Laterality: N/A;   FLEXIBLE SIGMOIDOSCOPY N/A 10/10/2015   Procedure: FLEXIBLE SIGMOIDOSCOPY;  Surgeon: [name], MD;  Location: [facility] ENDOSCOPY;  Service: Endoscopy;  Laterality: N/A;   KNEE SURGERY Right    NM MYOVIEW LTD  November 2011   No ischemia or infarction. EF 50-55% ( no improvement from 2009 Myoview EF of 28%   TEE WITHOUT CARDIOVERSION N/A 08/16/2014   Procedure: TRANSESOPHAGEAL ECHOCARDIOGRAM (TEE);  Surgeon: [name], MD;  Location: [facility] ORS;  Service: Cardiovascular;  Laterality: N/A;     reports that he quit smoking about 23 years ago. His smoking use included cigarettes. He has a 12.00 pack-year smoking history. He has never used smokeless tobacco.  He reports that he does not currently use alcohol. He reports that he does not use drugs.  Allergies  Allergen Reactions   Esomeprazole Magnesium Other (See Comments)    Suspected interstitial nephritis 2018   Eggs Or Egg-Derived Products Rash    Family History  Problem Relation Age of Onset   Hypertension Mother    Hyperlipidemia Mother    Diabetes Mother       Prior to Admission medications   Medication Sig Start Date End Date Taking? Authorizing Provider  allopurinol (ZYLOPRIM) 100 MG tablet Take 100 mg by mouth daily.    [provider]  apixaban (ELIQUIS) 5 MG TABS tablet Take 1 tablet (5 mg total) by mouth 2 (two) times daily. 10/08/20 12/09/20  [name], MD  atorvastatin (LIPITOR) 80 MG tablet Take 1 tablet (80 mg total) by mouth daily. 10/08/20   Marrianne Mood D, PA-C  carvedilol (COREG) 12.5 MG tablet Take 1  tablet (12.5 mg total) by mouth 2 (two) times daily with a meal. 12/09/20   Gollan, Kathlene November, MD  dapagliflozin propanediol (FARXIGA) 10 MG TABS tablet Take 1 tablet (10 mg total) by mouth daily before breakfast. 12/09/20   Gollan, Kathlene November, MD  ferrous gluconate (FERGON) 324 MG tablet Take 324 mg by mouth daily with breakfast.    [provider]  furosemide (LASIX) 40 MG tablet Take 1 tablet (40 mg total) by mouth daily. 10/08/20 02/05/21  Marrianne Mood D, PA-C  hydrALAZINE (APRESOLINE) 25 MG tablet Take 1 tablet (25 mg total) by mouth 3 (three) times daily. 10/08/20 04/06/21  Marrianne Mood D, PA-C  levothyroxine (SYNTHROID) 100 MCG tablet Take 100 mcg by mouth daily before breakfast.    [provider]  oxyCODONE-acetaminophen (PERCOCET) 5-325 MG tablet Take 1 tablet by mouth every 8 (eight) hours as needed for severe pain. 02/26/21 02/26/22  Lavonia Drafts, MD      Labs on Admission: I have personally reviewed following labs and imaging studies  CBC: Recent Labs  Lab 02/26/21 1024 02/28/21 1951  WBC 9.9 7.8  HGB 13.6 13.7  HCT 44.7 46.4  MCV 81.4 83.5  PLT 242 397   Basic Metabolic Panel: Recent Labs  Lab 02/26/21 1024 02/28/21 1951  NA 137 136  K 4.4 4.3  CL 106 108  CO2 18* 18*  GLUCOSE 135* 93  BUN 53* 48*  CREATININE 3.23* 3.15*  CALCIUM 8.9 8.7*   GFR: Estimated Creatinine Clearance: 33.9 mL/min (A) (by C-G formula based on SCr of 3.15 mg/dL (H)). Liver Function Tests: Recent Labs  Lab 02/26/21 1024 02/28/21 1951  AST 12* 12*  ALT 10 10  ALKPHOS 85 76  BILITOT 2.1* 2.1*  PROT 7.0 6.9  ALBUMIN 3.9 3.7   Recent Labs  Lab 02/26/21 1024 02/28/21 1951  LIPASE 27 26   No results for input(s): AMMONIA in the last 168 hours. Coagulation Profile: No results for input(s): INR, PROTIME in the last 168 hours. Cardiac Enzymes: No results for input(s): CKTOTAL, CKMB, CKMBINDEX, TROPONINI in the last 168 hours. BNP (last 3 results) No  results for input(s): PROBNP in the last 8760 hours. HbA1C: No results for input(s): HGBA1C in the last 72 hours. CBG: No results for input(s): GLUCAP in the last 168 hours. Lipid Profile: No results for input(s): CHOL, HDL, LDLCALC, TRIG, CHOLHDL, LDLDIRECT in the last 72 hours. Thyroid Function Tests: No results for input(s): TSH, T4TOTAL, FREET4, T3FREE, THYROIDAB in the last 72 hours. Anemia Panel: No results for  input(s): VITAMINB12, FOLATE, FERRITIN, TIBC, IRON, RETICCTPCT in the last 72 hours. Urine analysis:    Component Value Date/Time   COLORURINE YELLOW (A) 02/26/2021 1024   APPEARANCEUR CLOUDY (A) 02/26/2021 1024   APPEARANCEUR Clear 02/26/2014 1126   LABSPEC 1.015 02/26/2021 1024   LABSPEC 1.008 02/26/2014 1126   PHURINE 5.0 02/26/2021 1024   GLUCOSEU 150 (A) 02/26/2021 1024   GLUCOSEU Negative 02/26/2014 1126   HGBUR SMALL (A) 02/26/2021 1024   BILIRUBINUR NEGATIVE 02/26/2021 1024   BILIRUBINUR Negative 02/26/2014 1126   KETONESUR NEGATIVE 02/26/2021 1024   PROTEINUR >=300 (A) 02/26/2021 1024   NITRITE NEGATIVE 02/26/2021 1024   LEUKOCYTESUR LARGE (A) 02/26/2021 1024   LEUKOCYTESUR 1+ 02/26/2014 1126    Radiological Exams on Admission: DG Chest 2 View  Result Date: 02/28/2021 CLINICAL DATA:  Generalized abdominal pain. Evaluate for worsening pulmonary edema. EXAM: CHEST - 2 VIEW COMPARISON:  Radiograph 2 days ago 02/26/2021 FINDINGS: Unchanged cardiomegaly. Pulmonary edema is similar to prior exam. No significant progression. No confluent consolidation. Trace fluid in the fissures without significant sub pulmonic effusion. No pneumothorax. Stable osseous structures. IMPRESSION: 1. Unchanged cardiomegaly. 2. Unchanged pulmonary edema without progression. Electronically Signed   By: Keith Rake M.D.   On: 02/28/2021 23:58       Athena Masse MD [facility]   03/01/2021, 3:26 AM

## 2021-03-01 NOTE — Consult Note (Addendum)
Cardiology Consultation:   Patient ID: [name] MRN: [id number]; DOB: Dec 21, 1965  Admit date: 03/01/2021 Date of Consult: 03/01/2021  PCP:  [name], MD   [facility] Cardiologist:  Ida Rogue, MD   {   Patient Profile:   Hector [name] is a 56 y.o. 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 Sloan Leiter.  History of Present Illness:   Hector [name] is followed by Dr. Rockey Situ for the above cardiac issues.  Patient has low EF down to 20% in 2009.  EF 40 to 45% in 2018.  Catheterization 2016 showed nonobstructive CAD.  EF in 2021 was 45 to 50%.  Patient was admitted July 2022 for acute heart failure in the setting of medication noncompliance and concern for alcohol use versus hypertensive cardiomyopathy.  Echo showed EF 25 to 30% with global hypokinesis.  Cannot add ACE/ARB/spironolactone/Entresto given renal function.  Catheterization was avoided due to renal function.    ED visit 11/15/2020 for foot pain and tachycardia rates in the 130s.  Reported noncompliance with his medications.  He was given IV metoprolol and IV diltiazem with improvement of rates.  Previously But stopped due to elevated thyroid.  Patient has a history of GI bleed on Eliquis.  He was admitted October 2022 for GI bleed.  He had a colonoscopy which showed diverticulitis and internal hemorrhoids without active bleeding 07/22/2020.  GI saw patient and did not think another colonoscopy would give him any benefit.  Last seen in the office 12/09/2020.  He was in normal sinus rhythm with PACs.  He was started on Farxiga.  ED visit 02/27/2020 for abdominal pain was given IV morphine, IV Zofran, IV fluids.  CT abdomen pelvis was unremarkable.  Lab work showed  elevated BUN and creatinine.  Lipase was normal.  Patient presented to the ER 03/01/2021 for abdominal pain. Reports fluid on his stomach. Also reports worsening shortness of breath. No lower leg edema. No chest pain or palpitations. Has not been taking any of his medications, stopped on Monday.   In the ER EKG showed a flutter with RVR with rate in the 120s.  Chest x-ray showed cardiomegaly and pulmonary edema.  Patient was started on IV Cardizem load plus infusion.  BNP elevated to 1400. HS troponin 54>46.  Creatinine 3.5, which is up from baseline.  He was given IV Lasix.  Was admitted for further work-up.  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 12/2007 MV: EF 28%, no isch;  b. 8/12 Echo: EF 25-35%; c. 02/2014 Echo: EF 20-25%; d. 12/2014 Cath: minimal CAD; e. 01/2015 Echo: EF 50-55%;  d. 05/2016 Echo: EF 30-35%, diff HK, gr2 DD; e. 11/2016 Echo: EF 45-50%, diff HK.   Chronic combined systolic (congestive) and diastolic (congestive) heart failure ([place])    a. 05/2016 Most recent Echo: EF 30-35%, diff HK, Gr2 DD, mild MR, mod dil LA; b. 11/2016 Echo: EF 45-50%, diff HK.   CKD (chronic kidney disease), stage III (HCC)    Elevated troponin    Chronically elevated. - level was 0.17-0.10 during recent admission   Essential hypertension    GI bleed 11/2013   Hyperlipidemia    Pancreatitis    Paroxysmal A-fib (  [place])    a. new onset s/p unsuccessful TEE/DCCV on 08/16/2014; b. on amio/eliquis (CHA2DS2VASc = 2-3); c. reports 1-2 hrs of afib ~ q61mos.   Paroxysmal atrial flutter ([place])    a. new onset 07/2014; b. s/p unsuccessful TEE/DCCV 08/16/2014; c. on apixaban.   Sleep-disordered breathing    Has yet to have a sleep study   Stroke [facility])     Past Surgical History:  Procedure Laterality Date   CARDIAC CATHETERIZATION N/A 01/09/2015   Procedure: Left Heart Cath and Coronary Angiography;  Surgeon: [name], MD;  Location: [place] CV  LAB;  Service: Cardiovascular;  Laterality: N/A;   COLONOSCOPY N/A 07/22/2020   Procedure: COLONOSCOPY;  Surgeon: Toledo, Benay Pike, MD;  Location: [facility] ENDOSCOPY;  Service: Gastroenterology;  Laterality: N/A;   ELECTROPHYSIOLOGIC STUDY N/A 08/16/2014   Procedure: CARDIOVERSION;  Surgeon: [name], MD;  Location: [facility] ORS;  Service: Cardiovascular;  Laterality: N/A;   FLEXIBLE SIGMOIDOSCOPY N/A 10/10/2015   Procedure: FLEXIBLE SIGMOIDOSCOPY;  Surgeon: [name], MD;  Location: [facility] ENDOSCOPY;  Service: Endoscopy;  Laterality: N/A;   KNEE SURGERY Right    NM MYOVIEW LTD  November 2011   No ischemia or infarction. EF 50-55% ( no improvement from 2009 Myoview EF of 28%   TEE WITHOUT CARDIOVERSION N/A 08/16/2014   Procedure: TRANSESOPHAGEAL ECHOCARDIOGRAM (TEE);  Surgeon: [name], MD;  Location: [facility] ORS;  Service: Cardiovascular;  Laterality: N/A;     Home Medications:  Prior to Admission medications   Medication Sig Start Date End Date Taking? Authorizing Provider  apixaban (ELIQUIS) 5 MG TABS tablet Take 1 tablet (5 mg total) by mouth 2 (two) times daily. 10/08/20 03/01/21 Yes Gollan, Kathlene November, MD  atorvastatin (LIPITOR) 80 MG tablet Take 1 tablet (80 mg total) by mouth daily. 10/08/20  Yes Visser, Jacquelyn D, PA-C  carvedilol (COREG) 12.5 MG tablet Take 1 tablet (12.5 mg total) by mouth 2 (two) times daily with a meal. 12/09/20  Yes Gollan, Kathlene November, MD  allopurinol (ZYLOPRIM) 100 MG tablet Take 100 mg by mouth daily. Patient not taking: Reported on 03/01/2021    [provider]  dapagliflozin propanediol (FARXIGA) 10 MG TABS tablet Take 1 tablet (10 mg total) by mouth daily before breakfast. Patient not taking: Reported on 03/01/2021 12/09/20   [name], MD  ferrous gluconate (FERGON) 324 MG tablet Take 324 mg by mouth daily with breakfast. Patient not taking: Reported on 03/01/2021    [provider]  furosemide (LASIX) 40 MG tablet Take 1 tablet  (40 mg total) by mouth daily. 10/08/20 02/05/21  Marrianne Mood D, PA-C  gabapentin (NEURONTIN) 300 MG capsule Take 300 mg by mouth 2 (two) times daily. 01/23/21   [provider]  hydrALAZINE (APRESOLINE) 25 MG tablet Take 1 tablet (25 mg total) by mouth 3 (three) times daily. Patient not taking: Reported on 03/01/2021 10/08/20 04/06/21  Arvil Chaco, PA-C  levothyroxine (SYNTHROID) 100 MCG tablet Take 100 mcg by mouth daily before breakfast. Patient not taking: Reported on 03/01/2021    [provider]  oxyCODONE-acetaminophen (PERCOCET) 5-325 MG tablet Take 1 tablet by mouth every 8 (eight) hours as needed for severe pain. Patient not taking: Reported on 03/01/2021 02/26/21 02/26/22  Lavonia Drafts, MD    Inpatient Medications: Scheduled Meds:  diltiazem  10 mg Intravenous Once   Continuous Infusions:  diltiazem (CARDIZEM) infusion     PRN Meds:   Allergies:    Allergies  Allergen Reactions  Esomeprazole Magnesium Other (See Comments)    Suspected interstitial nephritis 2018   Eggs Or Egg-Derived Products Rash    Social History:   Social History   Socioeconomic History   Marital status: Single    Spouse name: Not on file   Number of children: Not on file   Years of education: Not on file   Highest education level: Not on file  Occupational History   Not on file  Tobacco Use   Smoking status: Former    Packs/day: 1.00    Years: 12.00    Pack years: 12.00    Types: Cigarettes    Quit date: 02/23/1998    Years since quitting: 23.0   Smokeless tobacco: Never  Vaping Use   Vaping Use: Never used  Substance and Sexual Activity   Alcohol use: Not Currently    Alcohol/week: 0.0 standard drinks    Comment: Past heavy drinker   Drug use: No   Sexual activity: Not Currently  Other Topics Concern   Not on file  Social History Narrative   Not on file   Social Determinants of Health   Financial Resource Strain: Not on file  Food Insecurity: Not on  file  Transportation Needs: Not on file  Physical Activity: Not on file  Stress: Not on file  Social Connections: Not on file  Intimate Partner Violence: Not on file    Family History:    Family History  Problem Relation Age of Onset   Hypertension Mother    Hyperlipidemia Mother    Diabetes Mother      ROS:  Please see the history of present illness.   All other ROS reviewed and negative.     Physical Exam/Data:   Vitals:   03/01/21 0500 03/01/21 0530 03/01/21 0600 03/01/21 0630  BP: (!) 142/116 (!) 140/124 (!) 134/120 (!) 131/114  Pulse: (!) 122 (!) 119 (!) 121 (!) 118  Resp: 17 20 19 11   Temp:      TempSrc:      SpO2: 95% 97% (!) 86% 90%  Weight:      Height:       No intake or output data in the 24 hours ending 03/01/21 0715 Last 3 Weights 02/28/2021 02/26/2021 01/08/2021  Weight (lbs) 256 lb 255 lb 15.3 oz 256 lb  Weight (kg) 116.121 kg 116.1 kg 116.121 kg     Body mass index is 35.7 kg/m.  General:  Well nourished, well developed, in no acute distress HEENT: normal Neck: + JVD Vascular: No carotid bruits; Distal pulses 2+ bilaterally Cardiac:  normal S1, S2; Reg IRreg; no murmur  Lungs:  clear to auscultation bilaterally, no wheezing, rhonchi or rales  Abd: soft, nontender, no hepatomegaly  Ext: no edema Musculoskeletal:  No deformities, BUE and BLE strength normal and equal Skin: warm and dry  Neuro:  CNs 2-12 intact, no focal abnormalities noted Psych:  Normal affect   EKG:  The EKG was personally reviewed and demonstrates:  aflutter, 120bpm, LAFB, PVC, nonspecific T wave changes Telemetry:  Telemetry was personally reviewed and demonstrates:  Aflutter HR 120s, PVCs  Relevant CV Studies:  Echo 09/13/2020 Left ventricular ejection fraction, by estimation, is 25 to 30%. The  left ventricle has severely decreased function. The left ventricle  demonstrates global hypokinesis. The left ventricular internal cavity size  was mildly dilated. Left ventricular   diastolic parameters are indeterminate. The average left ventricular  global longitudinal strain is -6.7 %. The global longitudinal strain is  abnormal.   2. Right ventricular systolic function is moderately reduced. The right  ventricular size is normal. Tricuspid regurgitation signal is inadequate  for assessing PA pressure.   3. The mitral valve is normal in structure. Moderate mitral valve  regurgitation.   4. The inferior vena cava is dilated in size with <50% respiratory  variability, suggesting right atrial pressure of 15 mmHg.      LHC 2016 Angiographically minimal CAD Non-ishcemic Cardiomyopathy Systemic Hypertension with Mild-Moderately increase LVEDP after pre-cath hydration. This confirms the presumed diagnosis of Non-ischemic Cardiomyopathy.  Laboratory Data:  High Sensitivity Troponin:   Recent Labs  Lab 02/28/21 1951 02/28/21 2203  TROPONINIHS 46* 54*     Chemistry Recent Labs  Lab 02/26/21 1024 02/28/21 1951  NA 137 136  K 4.4 4.3  CL 106 108  CO2 18* 18*  GLUCOSE 135* 93  BUN 53* 48*  CREATININE 3.23* 3.15*  CALCIUM 8.9 8.7*  GFRNONAA 22* 22*  ANIONGAP 13 10    Recent Labs  Lab 02/26/21 1024 02/28/21 1951  PROT 7.0 6.9  ALBUMIN 3.9 3.7  AST 12* 12*  ALT 10 10  ALKPHOS 85 76  BILITOT 2.1* 2.1*   Lipids No results for input(s): CHOL, TRIG, HDL, LABVLDL, LDLCALC, CHOLHDL in the last 168 hours.  Hematology Recent Labs  Lab 02/26/21 1024 02/28/21 1951  WBC 9.9 7.8  RBC 5.49 5.56  HGB 13.6 13.7  HCT 44.7 46.4  MCV 81.4 83.5  MCH 24.8* 24.6*  MCHC 30.4 29.5*  RDW 20.7* 20.8*  PLT 242 251   Thyroid No results for input(s): TSH, FREET4 in the last 168 hours.  BNP Recent Labs  Lab 02/28/21 1951  BNP 1,449.1*    DDimer No results for input(s): DDIMER in the last 168 hours.   Radiology/Studies:  CT ABDOMEN PELVIS WO CONTRAST  Result Date: 02/26/2021 CLINICAL DATA:  Abdominal pain and vomiting. EXAM: CT ABDOMEN AND PELVIS WITHOUT  CONTRAST TECHNIQUE: Multidetector CT imaging of the abdomen and pelvis was performed following the standard protocol without IV contrast. COMPARISON:  06/19/2020 FINDINGS: Lower chest: No acute abnormality. There is interlobular septal thickening identified within the visualized lung bases. Scar is noted within the right middle lobe. Posterior right lower lobe granuloma identified, image 4/3. Interlobular septal thickening within the periphery of the right lower lobe is likely postinflammatory. Hepatobiliary: No focal liver abnormality identified. Stone within the dependent portion of the gallbladder measures 3 mm. No gallbladder wall thickening or inflammation. No bile duct dilatation. Pancreas: Unremarkable. No pancreatic ductal dilatation or surrounding inflammatory changes. Spleen: Normal in size without focal abnormality. Adrenals/Urinary Tract: Normal adrenal glands. Bilateral kidney cysts are again noted. The largest arises off the lateral cortex of the interpolar right kidney measuring 5.3 cm. No kidney stones or hydronephrosis identified bilaterally. No hydroureter or ureteral calculi. Partially decompressed urinary bladder is identified with circumferential wall thickening. Stomach/Bowel: The stomach appears within normal limits. The appendix is visualized and appears within normal limits. Colonic diverticulosis identified. No signs of acute diverticulitis. No bowel wall thickening, inflammation or distension. Vascular/Lymphatic: Mild aortic atherosclerosis. No aneurysm. No abdominopelvic adenopathy identified. Reproductive: Prostate is unremarkable. Other: No abdominal wall hernia or abnormality. No abdominopelvic ascites. Musculoskeletal: No acute or significant osseous findings. IMPRESSION: 1. No acute findings identified within the abdomen or pelvis. 2. There is mild interlobular septal thickening within the visualized lung bases which may reflect early pulmonary edema. Correlate for clinical signs or  symptoms of CHF. 3. Bilateral kidney cysts. 4.  Gallstone. 5. Aortic Atherosclerosis (ICD10-I70.0). Electronically Signed   By: Kerby Moors M.D.   On: 02/26/2021 13:23   DG Chest 2 View  Result Date: 02/28/2021 CLINICAL DATA:  Generalized abdominal pain. Evaluate for worsening pulmonary edema. EXAM: CHEST - 2 VIEW COMPARISON:  Radiograph 2 days ago 02/26/2021 FINDINGS: Unchanged cardiomegaly. Pulmonary edema is similar to prior exam. No significant progression. No confluent consolidation. Trace fluid in the fissures without significant sub pulmonic effusion. No pneumothorax. Stable osseous structures. IMPRESSION: 1. Unchanged cardiomegaly. 2. Unchanged pulmonary edema without progression. Electronically Signed   By: Keith Rake M.D.   On: 02/28/2021 23:58   DG Chest Port 1 View  Result Date: 02/26/2021 CLINICAL DATA:  Edema EXAM: PORTABLE CHEST 1 VIEW COMPARISON:  Chest radiograph dated September 22, 2020 FINDINGS: The heart is enlarged. Pulmonary vascular congestion without evidence of frank pulmonary edema. No pleural effusion or pneumothorax. Eventration of the right hemidiaphragm, unchanged. IMPRESSION: Cardiomegaly with pulmonary vascular congestion. Electronically Signed   By: Keane Police D.O.   On: 02/26/2021 14:12     Assessment and Plan:   Aflutter with RVR Paroxysmal Aflutter - presents with abdominal pain from volume overload 2/2 med noncompliance - IV dilt bolus+infusion started for rate control - restart Eliquis - continue to rate control - restart Coreg - off amio 2/2 thyroid disease - He is asymptomatic - Heart rates continue to be high. IV dilt is not the best option given Low EF. Unable to cardiovert given he has been off Eliquis for a couple days. Stress medication compliance. MD to see  Acute on chronic systolic CHF NICM - BNP 5374 and CXR with interstitial edema - s/p IV lasix with good UOP - hold farxiga for AKI - IV lasix 40mg  BID - PTA laisx 40mg  daily>may need  Torsemide at discharge - Limited echo 10/2020 showed LVEF 30-35%, mild to mod MR, mild LVH, mildly reduced RVSF - continue diuresis - monitor kidney function, strict I/Os, and daily weights  Elevated troponin Nonobstructive CAD by [facility] 2016 - No chest pain reported - suspect demand ischemia in the setting of rapid aflutter and acute CHF - No further ischemic work-up at this time - Continue statin and BB - No ASA with Eliquis  AKI on CKD stage 3 - Scr 3.23, BUN 53 - baseline Scr around 2.4 - hold farxiga - IV lasix - daily BMP  Hypothyroidism - levothyroxine per IM  For questions or updates, please contact [facility] Please consult [email or web address] for contact info under    Signed, Cadence Ninfa Meeker, PA-C  03/01/2021 7:15 AM   I have seen and examined this patient with Cadence Furth.  Agree with above, note added to reflect my findings.  Patient presented to the hospital with shortness of breath.  Was found to be in atrial flutter.  He has not been taking his medicines for a few days as he has had abdominal distention and discomfort.  He is also not been eating for the last few days.  He has received IV Lasix with good urine output and his respiratory status is improved.  He does continue to complain of abdominal fullness.  GEN: Well nourished, well developed, in no acute distress  HEENT: normal  Neck: + JVD, carotid bruits, or masses Cardiac: Tachycardic, regular  no murmurs, rubs, or gallops,no edema  Respiratory:  clear to auscultation bilaterally, normal work of breathing GI: soft, nontender, nondistended, + BS MS: no deformity or atrophy  Skin: warm and dry Neuro:  Strength and sensation are intact Psych: euthymic mood, full affect   Atrial flutter: Appears somewhat atypical.  We Layci Stenglein continue anticoagulation with Eliquis.  He is currently off of amiodarone due to thyroid disease.  Have restarted his carvedilol for hopefully improved rate control.  Chloey Ricard likely need TEE and  cardioversion prior to discharge. Acute on chronic systolic heart failure due to nonischemic cardiomyopathy: BNP significantly elevated.  He has significant volume overload.  Anala Whisenant need continued diuresis.  We Mailani Degroote continue Lasix. Elevated troponin: Likely due to demand ischemia from atrial flutter.  No ischemic evaluation at this time. Acute on chronic CKD stage III: Likely due to combination of heart failure and atrial flutter.  Plan for IV Lasix.  Nedra Mcinnis M. Delorese Sellin MD 03/01/2021 2:13 PM

## 2021-03-01 NOTE — ED Provider Notes (Signed)
[facility] Provider Note    Event Date/Time   First MD Initiated Contact with Patient 03/01/21 [phone]     (approximate)   History   Abdominal Pain   HPI  Hector [name] is a 56 y.o. male history of alcoholic cardiomyopathy, paroxysmal A. fib, CVA, hypertension, hyperlipidemia, chronic kidney disease who presents to the emergency department with complaints of worsening abdominal discomfort and swelling.  States he has had decreased appetite.  Was seen here in the emergency department on 02/26/2021.  CT scan showed no acute abnormality and patient felt better.  Admission was discussed but patient wanted to be discharged home.  He states he is feeling short of breath today but no chest pain.  No fevers.  No vomiting or diarrhea.  Has had nausea.   History provided by patient.      Past Medical History:  Diagnosis Date   Alcohol abuse    Alcoholic cardiomyopathy ([place]) 2009   a. 12/2007 MV: EF 28%, no isch;  b. 8/12 Echo: EF 25-35%; c. 02/2014 Echo: EF 20-25%; d. 12/2014 Cath: minimal CAD; e. 01/2015 Echo: EF 50-55%;  d. 05/2016 Echo: EF 30-35%, diff HK, gr2 DD; e. 11/2016 Echo: EF 45-50%, diff HK.   Chronic combined systolic (congestive) and diastolic (congestive) heart failure ([place])    a. 05/2016 Most recent Echo: EF 30-35%, diff HK, Gr2 DD, mild MR, mod dil LA; b. 11/2016 Echo: EF 45-50%, diff HK.   CKD (chronic kidney disease), stage III (HCC)    Elevated troponin    Chronically elevated. - level was 0.17-0.10 during recent admission   Essential hypertension    GI bleed 11/2013   Hyperlipidemia    Pancreatitis    Paroxysmal A-fib (HCC)    a. new onset s/p unsuccessful TEE/DCCV on 08/16/2014; b. on amio/eliquis (CHA2DS2VASc = 2-3); c. reports 1-2 hrs of afib ~ q45mos.   Paroxysmal atrial flutter ([place])    a. new onset 07/2014; b. s/p unsuccessful TEE/DCCV 08/16/2014; c. on apixaban.   Sleep-disordered breathing    Has yet to have a sleep study   Stroke  [facility])     Past Surgical History:  Procedure Laterality Date   CARDIAC CATHETERIZATION N/A 01/09/2015   Procedure: Left Heart Cath and Coronary Angiography;  Surgeon: [name], MD;  Location: [facility];  Service: Cardiovascular;  Laterality: N/A;   COLONOSCOPY N/A 07/22/2020   Procedure: COLONOSCOPY;  Surgeon: Toledo, Benay Pike, MD;  Location: [facility] ENDOSCOPY;  Service: Gastroenterology;  Laterality: N/A;   ELECTROPHYSIOLOGIC STUDY N/A 08/16/2014   Procedure: CARDIOVERSION;  Surgeon: [name], MD;  Location: [facility] ORS;  Service: Cardiovascular;  Laterality: N/A;   FLEXIBLE SIGMOIDOSCOPY N/A 10/10/2015   Procedure: FLEXIBLE SIGMOIDOSCOPY;  Surgeon: [name], MD;  Location: [facility] ENDOSCOPY;  Service: Endoscopy;  Laterality: N/A;   KNEE SURGERY Right    NM MYOVIEW LTD  November 2011   No ischemia or infarction. EF 50-55% ( no improvement from 2009 Myoview EF of 28%   TEE WITHOUT CARDIOVERSION N/A 08/16/2014   Procedure: TRANSESOPHAGEAL ECHOCARDIOGRAM (TEE);  Surgeon: [name], MD;  Location: [facility] ORS;  Service: Cardiovascular;  Laterality: N/A;    MEDICATIONS:  Prior to Admission medications   Medication Sig Start Date End Date Taking? Authorizing Provider  allopurinol (ZYLOPRIM) 100 MG tablet Take 100 mg by mouth daily.    [provider]  apixaban (ELIQUIS) 5 MG TABS tablet Take 1 tablet (5 mg total) by mouth  2 (two) times daily. 10/08/20 12/09/20  [name], MD  atorvastatin (LIPITOR) 80 MG tablet Take 1 tablet (80 mg total) by mouth daily. 10/08/20   Marrianne Mood D, PA-C  carvedilol (COREG) 12.5 MG tablet Take 1 tablet (12.5 mg total) by mouth 2 (two) times daily with a meal. 12/09/20   Gollan, Kathlene November, MD  dapagliflozin propanediol (FARXIGA) 10 MG TABS tablet Take 1 tablet (10 mg total) by mouth daily before breakfast. 12/09/20   Gollan, Kathlene November, MD  ferrous gluconate (FERGON) 324 MG tablet Take 324 mg by mouth daily with  breakfast.    [provider]  furosemide (LASIX) 40 MG tablet Take 1 tablet (40 mg total) by mouth daily. 10/08/20 02/05/21  Marrianne Mood D, PA-C  hydrALAZINE (APRESOLINE) 25 MG tablet Take 1 tablet (25 mg total) by mouth 3 (three) times daily. 10/08/20 04/06/21  Marrianne Mood D, PA-C  levothyroxine (SYNTHROID) 100 MCG tablet Take 100 mcg by mouth daily before breakfast.    [provider]  oxyCODONE-acetaminophen (PERCOCET) 5-325 MG tablet Take 1 tablet by mouth every 8 (eight) hours as needed for severe pain. 02/26/21 02/26/22  Lavonia Drafts, MD    Physical Exam   Triage Vital Signs: ED Triage Vitals [02/28/21 1945]  Enc Vitals Group     BP (!) 135/105     Pulse Rate (!) 111     Resp 16     Temp 98.2 F (36.8 C)     Temp Source Oral     SpO2 98 %     Weight 256 lb (116.1 kg)     Height 5\' 11"  (1.803 m)     Head Circumference      Peak Flow      Pain Score 7     Pain Loc      Pain Edu?      Excl. in [place]?     Most recent vital signs: Vitals:   03/01/21 0300 03/01/21 0330  BP: (!) 143/120 (!) 134/100  Pulse: (!) 125 (!) 121  Resp: 14 (!) 28  Temp:    SpO2: 97% 94%    CONSTITUTIONAL: Alert and oriented and responds appropriately to questions.  Obese, chronically ill-appearing HEAD: Normocephalic, atraumatic EYES: Conjunctivae clear, pupils appear equal, sclera nonicteric ENT: normal nose; moist mucous membranes NECK: Supple, normal ROM CARD: Regular and tachycardic, S1 and S2 appreciated; no murmurs, no clicks, no rubs, no gallops RESP: Normal chest excursion without splinting or tachypnea; sounds equal bilaterally.  Does have some bibasilar rales.  No rhonchi or wheezing.  No hypoxia or respiratory distress at rest, speaking full sentences, patient does appear to be short of breath and has some tachypnea and drops his sats to 90% on room air with just minimal exertion when I am in the room but quickly comes back into the upper 90s at rest on room  air ABD/GI: Normal bowel sounds; non-distended; soft, non-tender, no rebound, no guarding, no peritoneal signs BACK: The back appears normal EXT: Normal ROM in all joints; no deformity noted, no edema; no cyanosis, no calf tenderness or calf swelling SKIN: Normal color for age and race; warm; no rash on exposed skin NEURO: Moves all extremities equally, normal speech PSYCH: The patient's mood and manner are appropriate.   ED Results / Procedures / Treatments   LABS: (all labs ordered are listed, but only abnormal results are displayed) Labs Reviewed  CBC - Abnormal; Notable for the following components:  Result Value   MCH 24.6 (*)    MCHC 29.5 (*)    RDW 20.8 (*)    All other components within normal limits  COMPREHENSIVE METABOLIC PANEL - Abnormal; Notable for the following components:   CO2 18 (*)    BUN 48 (*)    Creatinine, Ser 3.15 (*)    Calcium 8.7 (*)    AST 12 (*)    Total Bilirubin 2.1 (*)    GFR, Estimated 22 (*)    All other components within normal limits  BRAIN NATRIURETIC PEPTIDE - Abnormal; Notable for the following components:   B Natriuretic Peptide 1,449.1 (*)    All other components within normal limits  TROPONIN I (HIGH SENSITIVITY) - Abnormal; Notable for the following components:   Troponin I (High Sensitivity) 46 (*)    All other components within normal limits  TROPONIN I (HIGH SENSITIVITY) - Abnormal; Notable for the following components:   Troponin I (High Sensitivity) 54 (*)    All other components within normal limits  LIPASE, BLOOD     EKG:  EKG Interpretation  Date/Time:  Friday February 28 2021 19:53:49 EST Ventricular Rate:  120 PR Interval:  176 QRS Duration: 114 QT Interval:  360 QTC Calculation: 508 R Axis:   -49 Text Interpretation: Sinus tachycardia with occasional Premature ventricular complexes and Fusion complexes Low voltage QRS Left anterior fascicular block Possible Lateral infarct , age undetermined Abnormal ECG When  compared with ECG of 30-Nov-2020 08:52, PREVIOUS ECG IS PRESENT Confirmed by Pryor Curia [phone]) on 03/01/2021 1:16:13 AM         Date: 03/01/2021 3:10 AM  Rate: 124  Rhythm: A flutter with RVR  QRS Axis: normal  Intervals: Left anterior fascicular block  ST/T Wave abnormalities: normal  Conduction Disutrbances: none  Narrative Interpretation: A flutter with RVR, left anterior fascicular block, LVH      RADIOLOGY: My personal review and interpretation of imaging: Chest x-ray shows cardiomegaly and pulmonary edema.  I have personally reviewed all radiology reports.   DG Chest 2 View  Result Date: 02/28/2021 CLINICAL DATA:  Generalized abdominal pain. Evaluate for worsening pulmonary edema. EXAM: CHEST - 2 VIEW COMPARISON:  Radiograph 2 days ago 02/26/2021 FINDINGS: Unchanged cardiomegaly. Pulmonary edema is similar to prior exam. No significant progression. No confluent consolidation. Trace fluid in the fissures without significant sub pulmonic effusion. No pneumothorax. Stable osseous structures. IMPRESSION: 1. Unchanged cardiomegaly. 2. Unchanged pulmonary edema without progression. Electronically Signed   By: Keith Rake M.D.   On: 02/28/2021 23:58     PROCEDURES:  Critical Care performed: Yes, see critical care procedure note(s)   CRITICAL CARE Performed by: Cyril Mourning Cirilo Canner   Total critical care time: 55 minutes  Critical care time was exclusive of separately billable procedures and treating other patients.  Critical care was necessary to treat or prevent imminent or life-threatening deterioration.  Critical care was time spent personally by me on the following activities: development of treatment plan with patient and/or surrogate as well as nursing, discussions with consultants, evaluation of patient's response to treatment, examination of patient, obtaining history from patient or surrogate, ordering and performing treatments and interventions, ordering and review of  laboratory studies, ordering and review of radiographic studies, pulse oximetry and re-evaluation of patient's condition.   Marland Kitchen1-3 Lead EKG Interpretation Performed by: Jaylene Schrom, Delice Bison, DO Authorized by: Frida Wahlstrom, Delice Bison, DO     Interpretation: abnormal     ECG rate:  121   ECG rate assessment:  tachycardic     Rhythm: atrial flutter     Ectopy: none     Conduction: normal      IMPRESSION / MDM / ASSESSMENT AND PLAN / ED COURSE  I reviewed the triage vital signs and the nursing notes.    Patient here with complaints of shortness of breath, abdominal pain, anorexia.  The patient is on the cardiac monitor to evaluate for evidence of arrhythmia and/or significant heart rate changes.   DIFFERENTIAL DIAGNOSIS (includes but not limited to):   CHF, ACS, PE, pneumonia, gastroenteritis, gastritis, bowel obstruction, anasarca, ascites, appendicitis, diverticulitis, colitis, UTI, bowel perforation   PLAN: Cardiac labs, chest x-ray, EKG, cardiac monitoring, pain medication, IV diuresis.   MEDICATIONS GIVEN IN ED: Medications  diltiazem (CARDIZEM) 1 mg/mL load via infusion 10 mg (has no administration in time range)    And  diltiazem (CARDIZEM) 125 mg in dextrose 5% 125 mL (1 mg/mL) infusion (has no administration in time range)  furosemide (LASIX) injection 40 mg (40 mg Intravenous Given 03/01/21 0151)  morphine 4 MG/ML injection 4 mg (4 mg Intravenous Given 03/01/21 0150)  ondansetron (ZOFRAN) injection 4 mg (4 mg Intravenous Given 03/01/21 0146)     ED COURSE: Patient here with complaints of abdominal swelling and shortness of breath.  Was seen here in the emergency department on January 4 and had a CT scan that showed no acute abnormality in the abdomen/pelvis but did show early pulmonary edema in the lung bases.  Chest x-ray was obtained at that time that showed cardiomegaly and pulmonary vascular congestion.  Chest x-ray repeated today which shows stable pulmonary edema and cardiomegaly.   Troponin x2 minimally elevated here but this appears to be his baseline.  Troponins are flat.  BNP elevated to 1400.  Given IV Lasix here but despite diuresis patient drops his sats to 90% on room air with minimal exertion.  Quickly comes back up into the upper 90s on room air at rest.  Patient was initially slightly tachycardic on my evaluation but now heart rate is in the 120s and repeat EKG shows that he is in atrial flutter with RVR.  He is also hypertensive here.  We will start diltiazem bolus and infusion.  He also appears to have acute kidney injury superimposed on chronic kidney disease.  Will discuss with hospitalist for admission for a flutter with RVR, CHF exacerbation and AKI.  As for patient's abdominal pain, he reports it is feeling better after morphine and Zofran and he has been able to eat and drink without difficulty.  I do not feel repeat CT imaging is indicated given benign abdominal exam.   CONSULTS:  Consulted and discussed patient's case with hospitalist, Dr. Damita Dunnings.  I have recommended admission and consulting physician agrees and will place admission orders.  Patient (and family if present) agree with this plan.   I reviewed all nursing notes, vitals, pertinent previous records.  All labs, EKGs, imaging ordered have been independently reviewed and interpreted by myself.    OUTSIDE RECORDS REVIEWED:    Echo 09/13/20:  IMPRESSIONS     1. Left ventricular ejection fraction, by estimation, is 25 to 30%. The  left ventricle has severely decreased function. The left ventricle  demonstrates global hypokinesis. The left ventricular internal cavity size  was mildly dilated. Left ventricular  diastolic parameters are indeterminate. The average left ventricular  global longitudinal strain is -6.7 %. The global longitudinal strain is  abnormal.   2. Right ventricular systolic function is  moderately reduced. The right  ventricular size is normal. Tricuspid regurgitation signal is  inadequate  for assessing PA pressure.   3. The mitral valve is normal in structure. Moderate mitral valve  regurgitation.   4. The inferior vena cava is dilated in size with <50% respiratory  variability, suggesting right atrial pressure of 15 mmHg.     Cath 01/09/2015:  Angiographically minimal CAD Non-ishcemic Cardiomyopathy Systemic Hypertension with Mild-Moderately increase LVEDP after pre-cath hydration.     This confirms the presumed diagnosis of Non-ischemic Cardiomyopathy.   Plan: Discharge home after Bed-rest & TR band removal Continue current Cardiomyopathy Medications - BB, Entresto & Lasix Will refer back to Dr. Caryl Comes for reconsideration of ICD.     FINAL CLINICAL IMPRESSION(S) / ED DIAGNOSES   Final diagnoses:  Acute on chronic congestive heart failure, unspecified heart failure type (HCC)  Acute kidney injury superimposed on chronic kidney disease (HCC)  Hypertension, unspecified type  Atrial flutter with rapid ventricular response ([place])     Rx / DC Orders   ED Discharge Orders     None        Note:  This document was prepared using Dragon voice recognition software and may include unintentional dictation errors.   Asiya Cutbirth, Delice Bison, DO 03/01/21 [phone]

## 2021-03-01 NOTE — Progress Notes (Signed)
Patient seen and examined in the emergency room.  Admitted early morning hours by nighttime hospitalist.  Came back to the hospital with abdominal distention and shortness of breath.  Currently denies any complaints.  He thinks his belly is feeling less distended after urinating plenty with IV Lasix.   Telemetry shows A. fib with heart rate about 120-130.  Blood pressure stable.  Patient is without any chest pain or palpitations. Patient has not received any medication so far, will resume home dose of carvedilol.  Resume Eliquis.  Acute on chronic systolic heart failure: Known EF of 30 to 35%.  Clinical evidence of fluid overload. On Lasix 40 mg daily at home.  Started on Lasix 40 mg twice daily.  Intake output monitoring.  Renal function monitoring.  Will be followed by cardiology.  Admission orders done.  Resumed Eliquis.  Will start on cardiac diet.  Same-day admission.  No charge visit.

## 2021-03-02 LAB — BASIC METABOLIC PANEL
Anion gap: 12 (ref 5–15)
BUN: 46 mg/dL — ABNORMAL HIGH (ref 6–20)
CO2: 23 mmol/L (ref 22–32)
Calcium: 8.6 mg/dL — ABNORMAL LOW (ref 8.9–10.3)
Chloride: 102 mmol/L (ref 98–111)
Creatinine, Ser: 3.16 mg/dL — ABNORMAL HIGH (ref 0.61–1.24)
GFR, Estimated: 22 mL/min — ABNORMAL LOW (ref >60)
Glucose, Bld: 106 mg/dL — ABNORMAL HIGH (ref 70–99)
Potassium: 3.7 mmol/L (ref 3.5–5.1)
Sodium: 137 mmol/L (ref 135–145)

## 2021-03-02 LAB — HIV ANTIBODY (ROUTINE TESTING W REFLEX): HIV Screen 4th Generation wRfx: NONREACTIVE

## 2021-03-02 MED ORDER — LEVOTHYROXINE SODIUM 100 MCG PO TABS
100.0000 ug | ORAL_TABLET | Freq: Every day | ORAL | Status: DC
  Administered 2021-03-03 – 2021-03-06 (×4): 100 ug via ORAL
  Filled 2021-03-02 (×2): qty 1
  Filled 2021-03-02: qty 2
  Filled 2021-03-02: qty 1

## 2021-03-02 NOTE — ED Notes (Signed)
Pt given chocolate ice cream x 2 per his request.  Est 100.4mg  sodium.

## 2021-03-02 NOTE — ED Notes (Signed)
Pt resting in bed. Appears to be sleeping at this time. Chest is rising and falling symmetrically. No acute distress noted. Will continue to monitor.   °

## 2021-03-02 NOTE — ED Notes (Signed)
Pt given [place] sandwich tray minus the chips and graham crackers, plus 1 8oz can lemon lime [place].  Est sodium content 665mg .

## 2021-03-02 NOTE — Progress Notes (Signed)
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  Admitted with CHF exacerbation.  Also on rapid A. fib.   Assessment & Plan:   Principal Problem:   Atrial flutter with rapid ventricular response (HCC) Active Problems:   Nonischemic cardiomyopathy: EF 20-25%   Essential hypertension   Acute on chronic systolic CHF (congestive heart failure) (HCC)   Acute renal failure superimposed on stage 3b chronic kidney disease (HCC)  Paroxysmal a flutter with RVR: Symptoms well controlled.  Heart rate acceptable.  On Coreg.  Started back on Eliquis.  Tolerating well.  Followed by cardiology.  May need cardioversion.  Acute on chronic systolic congestive heart failure with history of nonischemic cardiomyopathy: Significant symptoms.  Presentation BNP 1449.  Creatinine 3.15-baseline 2.6. Currently on IV diuresis.  Urine output 3200 last 24 hours.  Clinically some improvement today. Patient on Farxiga. Already on carvedilol. Unable to tolerate other heart failure therapy including ACE or ARB because of worsening renal function.  Hypothyroidism: Recent TSH more than 12.  Not taking thyroxine at home.  Resume thyroxine 100 mcg daily.  Will need follow-up in 4 to 6 weeks.  Acute kidney injury with history of chronic kidney disease stage IIIb: Probably due to acute CHF and cardiorenal syndrome.  Urinating well.  Creatinine remains elevated but is stable.  We will continue to monitor on diuresis.     DVT prophylaxis:  apixaban (ELIQUIS) tablet 5 mg   Code Status: Full code Family Communication:  None Disposition Plan: Status is: Inpatient  Remains inpatient appropriate because: IV diuresis.         Consultants:  Cardiology  Procedures:  None  Antimicrobials:  None   Subjective: Patient seen and examined.  He is still in the emergency room.  Denies any chest pain.  His main complaint is abdominal bloating.  Denies any palpitations.  Telemetry with a flutter with heart rate 100-125.  Objective: Vitals:   03/02/21 0800 03/02/21 0824 03/02/21 1030 03/02/21 1050  BP: (!) 119/98 (!) 119/98 101/88 (!) 118/95  Pulse: (!) 119 (!) 123 (!) 120 (!) 126  Resp:  18 15   Temp:  97.8 F (36.6 C)    TempSrc:  Oral    SpO2: (!) 88% 99% 97%   Weight:      Height:        Intake/Output Summary (Last 24 hours) at 03/02/2021 1307 Last data filed at 03/02/2021 0826 Gross per 24 hour  Intake --  Output 3300 ml  Net -3300 ml   Filed Weights   02/28/21 1945  Weight: 116.1 kg    Examination:  General exam: Appears calm and comfortable  Sitting at the edge of the bed.  Not in any distress. Respiratory system: Clear to auscultation. Respiratory effort normal.  Mostly clear bilaterally. Cardiovascular system: S1 & S2 heard, irregularly irregular.  Tachycardic.   Gastrointestinal system: Abdomen is nondistended, soft and nontender. No organomegaly or masses felt. Normal bowel sounds heard. Central nervous system: Alert and oriented. No focal neurological deficits. No peripheral edema.    Data Reviewed: I have personally reviewed following labs and imaging studies  CBC: Recent Labs  Lab 02/26/21 1024 02/28/21 1951  WBC 9.9 7.8  HGB 13.6 13.7  HCT 44.7 46.4  MCV 81.4 83.5  PLT 242 505   Basic Metabolic Panel: Recent Labs  Lab 02/26/21 1024 02/28/21 1951 03/02/21 0638  NA 137 136 137  K 4.4 4.3 3.7  CL 106 108 102  CO2 18* 18* 23  GLUCOSE 135* 93 106*  BUN 53* 48* 46*  CREATININE 3.23* 3.15* 3.16*  CALCIUM 8.9 8.7* 8.6*   GFR: Estimated Creatinine  Clearance: 33.8 mL/min (A) (by C-G formula based on SCr of 3.16 mg/dL (H)). Liver Function Tests: Recent Labs  Lab 02/26/21 1024 02/28/21 1951  AST 12* 12*  ALT 10 10  ALKPHOS 85 76  BILITOT 2.1* 2.1*  PROT 7.0 6.9  ALBUMIN 3.9 3.7   Recent Labs  Lab 02/26/21 1024 02/28/21 1951  LIPASE 27 26   No results for input(s): AMMONIA in the last 168 hours. Coagulation Profile: No results for input(s): INR, PROTIME in the last 168 hours. Cardiac Enzymes: No results for input(s): CKTOTAL, CKMB, CKMBINDEX, TROPONINI in the last 168 hours. BNP (last 3 results) No results for input(s): PROBNP in the last 8760 hours. HbA1C: No results for input(s): HGBA1C in the last 72 hours. CBG: No results for input(s): GLUCAP in the last 168 hours. Lipid Profile: No results for input(s): CHOL, HDL, LDLCALC, TRIG, CHOLHDL, LDLDIRECT in the last 72 hours. Thyroid Function Tests: No results for input(s): TSH, T4TOTAL, FREET4, T3FREE, THYROIDAB in the last 72 hours. Anemia Panel: No results for input(s): VITAMINB12, FOLATE, FERRITIN, TIBC, IRON, RETICCTPCT in the last 72 hours. Sepsis Labs: No results for input(s): PROCALCITON, LATICACIDVEN in the last 168 hours.  Recent Results (from the past 240 hour(s))  Resp Panel by RT-PCR (Flu A&B, Covid) Nasopharyngeal Swab     Status: None   Collection Time: 03/01/21 10:47 AM   Specimen: Nasopharyngeal Swab; Nasopharyngeal(NP) swabs in vial transport medium  Result Value Ref Range Status   SARS Coronavirus 2 by RT PCR NEGATIVE NEGATIVE Final    Comment: (NOTE) SARS-CoV-2 target nucleic acids are NOT DETECTED.  The SARS-CoV-2 RNA is generally detectable in upper respiratory specimens during the acute phase of infection. The lowest concentration of SARS-CoV-2 viral copies this assay can detect is 138 copies/mL. A negative result does not preclude SARS-Cov-2 infection and should not be used as the sole basis for treatment or other patient management  decisions. A negative result may occur with  improper specimen collection/handling, submission of specimen other than nasopharyngeal swab, presence of viral mutation(s) within the areas targeted by this assay, and inadequate number of viral copies(<138 copies/mL). A negative result must be combined with clinical observations, patient history, and epidemiological information. The expected result is Negative.  Fact Sheet for Patients:  EntrepreneurPulse.com.au  Fact Sheet for Healthcare Providers:  IncredibleEmployment.be  This test is no t yet approved or cleared by the [place] FDA and  has been authorized for detection and/or diagnosis of SARS-CoV-2 by FDA under an Emergency Use Authorization (EUA). This EUA will remain  in effect (meaning this test can be used) for the duration of the COVID-19 declaration under Section 564(b)(1) of the Act, 21 U.S.C.section 360bbb-3(b)(1), unless the authorization is terminated  or revoked sooner.       Influenza A by PCR NEGATIVE NEGATIVE Final   Influenza B by PCR NEGATIVE NEGATIVE Final    Comment: (NOTE) The Xpert Xpress SARS-CoV-2/FLU/RSV plus assay is intended as an aid in the diagnosis of influenza  from Nasopharyngeal swab specimens and should not be used as a sole basis for treatment. Nasal washings and aspirates are unacceptable for Xpert Xpress SARS-CoV-2/FLU/RSV testing.  Fact Sheet for Patients: EntrepreneurPulse.com.au  Fact Sheet for Healthcare Providers: IncredibleEmployment.be  This test is not yet approved or cleared by the [place] FDA and has been authorized for detection and/or diagnosis of SARS-CoV-2 by FDA under an Emergency Use Authorization (EUA). This EUA will remain in effect (meaning this test can be used) for the duration of the COVID-19 declaration under Section 564(b)(1) of the Act, 21 U.S.C. section 360bbb-3(b)(1), unless the  authorization is terminated or revoked.  Performed at [facility], [address] [place], [place] [address]          Radiology Studies: DG Chest 2 View  Result Date: 02/28/2021 CLINICAL DATA:  Generalized abdominal pain. Evaluate for worsening pulmonary edema. EXAM: CHEST - 2 VIEW COMPARISON:  Radiograph 2 days ago 02/26/2021 FINDINGS: Unchanged cardiomegaly. Pulmonary edema is similar to prior exam. No significant progression. No confluent consolidation. Trace fluid in the fissures without significant sub pulmonic effusion. No pneumothorax. Stable osseous structures. IMPRESSION: 1. Unchanged cardiomegaly. 2. Unchanged pulmonary edema without progression. Electronically Signed   By: Keith Rake M.D.   On: 02/28/2021 23:58        Scheduled Meds:  apixaban  5 mg Oral BID   atorvastatin  80 mg Oral QHS   carvedilol  12.5 mg Oral BID WC   dapagliflozin propanediol  10 mg Oral QAC breakfast   furosemide  40 mg Intravenous BID   gabapentin  300 mg Oral BID   [START ON 03/03/2021] levothyroxine  100 mcg Oral QAC breakfast   Continuous Infusions:   LOS: 1 day    Time spent: 35 minutes    Barb Merino, MD [facility] Pager [phone]

## 2021-03-02 NOTE — ED Notes (Signed)
Pt given 2 applesauce cups per his request, 10mg  sodium est.

## 2021-03-02 NOTE — Progress Notes (Signed)
Progress Note  Patient Name: Hector [name] Date of Encounter: 03/02/2021  [facility] Cardiologist: Ida Rogue, MD   Subjective   Feeling improved with improved respiratory status.  Remains in atrial flutter.  No acute shortness of breath.  Inpatient Medications    Scheduled Meds:  apixaban  5 mg Oral BID   atorvastatin  80 mg Oral QHS   carvedilol  12.5 mg Oral BID WC   dapagliflozin propanediol  10 mg Oral QAC breakfast   furosemide  40 mg Intravenous BID   gabapentin  300 mg Oral BID   [START ON 03/03/2021] levothyroxine  100 mcg Oral QAC breakfast   Continuous Infusions:  PRN Meds: acetaminophen **OR** acetaminophen   Vital Signs    Vitals:   03/02/21 0800 03/02/21 0824 03/02/21 1030 03/02/21 1050  BP: (!) 119/98 (!) 119/98 101/88 (!) 118/95  Pulse: (!) 119 (!) 123 (!) 120 (!) 126  Resp:  18 15   Temp:  97.8 F (36.6 C)    TempSrc:  Oral    SpO2: (!) 88% 99% 97%   Weight:      Height:        Intake/Output Summary (Last 24 hours) at 03/02/2021 1307 Last data filed at 03/02/2021 4627 Gross per 24 hour  Intake --  Output 3300 ml  Net -3300 ml   Last 3 Weights 02/28/2021 02/26/2021 01/08/2021  Weight (lbs) 256 lb 255 lb 15.3 oz 256 lb  Weight (kg) 116.121 kg 116.1 kg 116.121 kg      Telemetry    Atrial flutter- Personally Reviewed  ECG    None new- Personally Reviewed  Physical Exam   GEN: No acute distress.   Neck: No JVD Cardiac: Tachycardic, regular, no murmurs, rubs, or gallops.  Respiratory: Crackles at the lung bases GI: Soft, nontender, non-distended  MS: No edema; No deformity. Neuro:  Nonfocal  Psych: Normal affect   Labs    High Sensitivity Troponin:   Recent Labs  Lab 02/28/21 1951 02/28/21 2203  TROPONINIHS 46* 54*     Chemistry Recent Labs  Lab 02/26/21 1024 02/28/21 1951 03/02/21 0638  NA 137 136 137  K 4.4 4.3 3.7  CL 106 108 102  CO2 18* 18* 23  GLUCOSE 135* 93 106*  BUN 53* 48* 46*  CREATININE 3.23*  3.15* 3.16*  CALCIUM 8.9 8.7* 8.6*  PROT 7.0 6.9  --   ALBUMIN 3.9 3.7  --   AST 12* 12*  --   ALT 10 10  --   ALKPHOS 85 76  --   BILITOT 2.1* 2.1*  --   GFRNONAA 22* 22* 22*  ANIONGAP 13 10 12     Lipids No results for input(s): CHOL, TRIG, HDL, LABVLDL, LDLCALC, CHOLHDL in the last 168 hours.  Hematology Recent Labs  Lab 02/26/21 1024 02/28/21 1951  WBC 9.9 7.8  RBC 5.49 5.56  HGB 13.6 13.7  HCT 44.7 46.4  MCV 81.4 83.5  MCH 24.8* 24.6*  MCHC 30.4 29.5*  RDW 20.7* 20.8*  PLT 242 251   Thyroid No results for input(s): TSH, FREET4 in the last 168 hours.  BNP Recent Labs  Lab 02/28/21 1951  BNP 1,449.1*    DDimer No results for input(s): DDIMER in the last 168 hours.   Radiology    DG Chest 2 View  Result Date: 02/28/2021 CLINICAL DATA:  Generalized abdominal pain. Evaluate for worsening pulmonary edema. EXAM: CHEST - 2 VIEW COMPARISON:  Radiograph 2 days ago 02/26/2021 FINDINGS:  Unchanged cardiomegaly. Pulmonary edema is similar to prior exam. No significant progression. No confluent consolidation. Trace fluid in the fissures without significant sub pulmonic effusion. No pneumothorax. Stable osseous structures. IMPRESSION: 1. Unchanged cardiomegaly. 2. Unchanged pulmonary edema without progression. Electronically Signed   By: Keith Rake M.D.   On: 02/28/2021 23:58    Cardiac Studies   TTE 10/30/20  1. Left ventricular ejection fraction, by estimation, is 30 to 35%. The  left ventricle has moderately decreased function. The left ventricle  demonstrates global hypokinesis. The left ventricular internal cavity size  was borderline dilated. There is mild   left ventricular hypertrophy.   2. Right ventricular systolic function is mildly reduced. The right  ventricular size is normal.   3. Mild to moderate mitral valve regurgitation.   Patient Profile     56 y.o. male with a history of systolic heart failure presented to the hospital with shortness of breath,  found to be in atrial flutter.  Assessment & Plan    1.  Atrial flutter: Appears somewhat atypical.  Continue anticoagulation with Eliquis.  Currently off of amiodarone due to thyroid disease.  Have restarted carvedilol, though rate control has not improved.  Fynley Chrystal likely need TEE and cardioversion prior to discharge.  2.  Acute on chronic systolic heart failure due to nonischemic cardiomyopathy: BNP significantly elevated.  Net out 4.4 L.  Creatinine elevated but has remained stable.  Continue with diuresis.  3.  Acute on chronic CKD stage III: Likely due to combination of heart failure and atrial flutter.  Continue IV Lasix.  4.  Elevated troponin: Likely due to demand ischemia from atrial flutter.  No ischemic evaluation at this time.     For questions or updates, please contact [place] Please consult [email or web address] for contact info under        Signed, Roy Snuffer Meredith Leeds, MD  03/02/2021, 1:07 PM

## 2021-03-03 LAB — CBC
HCT: 46.8 % (ref 39.0–52.0)
Hemoglobin: 14.3 g/dL (ref 13.0–17.0)
MCH: 24.9 pg — ABNORMAL LOW (ref 26.0–34.0)
MCHC: 30.6 g/dL (ref 30.0–36.0)
MCV: 81.4 fL (ref 80.0–100.0)
Platelets: 244 10*3/uL (ref 150–400)
RBC: 5.75 MIL/uL (ref 4.22–5.81)
RDW: 20.4 % — ABNORMAL HIGH (ref 11.5–15.5)
WBC: 9.4 10*3/uL (ref 4.0–10.5)
nRBC: 0 % (ref 0.0–0.2)

## 2021-03-03 LAB — BASIC METABOLIC PANEL
Anion gap: 9 (ref 5–15)
BUN: 50 mg/dL — ABNORMAL HIGH (ref 6–20)
CO2: 25 mmol/L (ref 22–32)
Calcium: 8.4 mg/dL — ABNORMAL LOW (ref 8.9–10.3)
Chloride: 99 mmol/L (ref 98–111)
Creatinine, Ser: 3.37 mg/dL — ABNORMAL HIGH (ref 0.61–1.24)
GFR, Estimated: 21 mL/min — ABNORMAL LOW (ref >60)
Glucose, Bld: 118 mg/dL — ABNORMAL HIGH (ref 70–99)
Potassium: 3.9 mmol/L (ref 3.5–5.1)
Sodium: 133 mmol/L — ABNORMAL LOW (ref 135–145)

## 2021-03-03 MED ORDER — FUROSEMIDE 40 MG PO TABS
60.0000 mg | ORAL_TABLET | Freq: Every day | ORAL | Status: DC
  Administered 2021-03-04: 60 mg via ORAL
  Filled 2021-03-03: qty 1

## 2021-03-03 MED ORDER — METOPROLOL TARTRATE 25 MG PO TABS
12.5000 mg | ORAL_TABLET | Freq: Four times a day (QID) | ORAL | Status: DC
  Administered 2021-03-03 – 2021-03-06 (×7): 12.5 mg via ORAL
  Filled 2021-03-03 (×11): qty 1

## 2021-03-03 MED ORDER — MUSCLE RUB 10-15 % EX CREA
TOPICAL_CREAM | CUTANEOUS | Status: DC | PRN
  Filled 2021-03-03: qty 85

## 2021-03-03 NOTE — Progress Notes (Signed)
Progress Note  Patient Name: Hector [name] Date of Encounter: 03/03/2021  [facility] Cardiologist: Ida Rogue, MD   Subjective   He remains in atrial flutter with RVR with ventricular rates in the 1-teens to 120s bpm. Documented UOP 3.1 L for the past 24 hours with a net - 6.3 L for the admission with nearly 2 bedside urinals full. Renal function trending up. Wilder Glade held today. Remains on IV Lasix.   Inpatient Medications    Scheduled Meds:  apixaban  5 mg Oral BID   atorvastatin  80 mg Oral QHS   carvedilol  12.5 mg Oral BID WC   furosemide  40 mg Intravenous BID   gabapentin  300 mg Oral BID   levothyroxine  100 mcg Oral Q0600   Continuous Infusions:  PRN Meds: acetaminophen **OR** acetaminophen   Vital Signs    Vitals:   03/03/21 0500 03/03/21 0714 03/03/21 1041 03/03/21 1042  BP: 97/82 100/83 140/84 107/84  Pulse: (!) 122 (!) 119 (!) 120 (!) 120  Resp: 20 18  (!) 25  Temp:  98.3 F (36.8 C)  98.1 F (36.7 C)  TempSrc:  Oral  Oral  SpO2: 95% 98%  93%  Weight:      Height:        Intake/Output Summary (Last 24 hours) at 03/03/2021 1513 Last data filed at 03/03/2021 0520 Gross per 24 hour  Intake 936 ml  Output 2900 ml  Net -1964 ml    Last 3 Weights 02/28/2021 02/26/2021 01/08/2021  Weight (lbs) 256 lb 255 lb 15.3 oz 256 lb  Weight (kg) 116.121 kg 116.1 kg 116.121 kg      Telemetry    Atrial flutter- Personally Reviewed  ECG    No new tracings - Personally Reviewed  Physical Exam   GEN: No acute distress.   Neck: No JVD Cardiac: Tachycardic, regular, no murmurs, rubs, or gallops.  Respiratory: Crackles at the lung bases GI: Soft, nontender, non-distended  MS: No edema; No deformity. Neuro:  Nonfocal  Psych: Normal affect   Labs    High Sensitivity Troponin:   Recent Labs  Lab 02/28/21 1951 02/28/21 2203  TROPONINIHS 46* 54*      Chemistry Recent Labs  Lab 02/26/21 1024 02/28/21 1951 03/02/21 0638 03/03/21 0459  NA  137 136 137 133*  K 4.4 4.3 3.7 3.9  CL 106 108 102 99  CO2 18* 18* 23 25  GLUCOSE 135* 93 106* 118*  BUN 53* 48* 46* 50*  CREATININE 3.23* 3.15* 3.16* 3.37*  CALCIUM 8.9 8.7* 8.6* 8.4*  PROT 7.0 6.9  --   --   ALBUMIN 3.9 3.7  --   --   AST 12* 12*  --   --   ALT 10 10  --   --   ALKPHOS 85 76  --   --   BILITOT 2.1* 2.1*  --   --   GFRNONAA 22* 22* 22* 21*  ANIONGAP 13 10 12 9      Lipids No results for input(s): CHOL, TRIG, HDL, LABVLDL, LDLCALC, CHOLHDL in the last 168 hours.  Hematology Recent Labs  Lab 02/26/21 1024 02/28/21 1951 03/03/21 0459  WBC 9.9 7.8 9.4  RBC 5.49 5.56 5.75  HGB 13.6 13.7 14.3  HCT 44.7 46.4 46.8  MCV 81.4 83.5 81.4  MCH 24.8* 24.6* 24.9*  MCHC 30.4 29.5* 30.6  RDW 20.7* 20.8* 20.4*  PLT 242 251 244    Thyroid No results for input(s): TSH,  FREET4 in the last 168 hours.  BNP Recent Labs  Lab 02/28/21 1951  BNP 1,449.1*     DDimer No results for input(s): DDIMER in the last 168 hours.   Radiology    No results found.  Cardiac Studies   TTE 10/30/20  1. Left ventricular ejection fraction, by estimation, is 30 to 35%. The  left ventricle has moderately decreased function. The left ventricle  demonstrates global hypokinesis. The left ventricular internal cavity size  was borderline dilated. There is mild   left ventricular hypertrophy.   2. Right ventricular systolic function is mildly reduced. The right  ventricular size is normal.   3. Mild to moderate mitral valve regurgitation.   Patient Profile     56 y.o. 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   Assessment & Plan    1.  Atrial flutter with RVR: Ventricular rates remain tachycardic.  Appears somewhat atypical.  Currently  off of amiodarone due to thyroid disease.  Had not been on medications at home leading up to his admission.  BP soft on Coreg and ventricular rates remain suboptimally controlled. Transition Coreg to Lopressor 12.5 mg q 6 hours with hold parameters of systolic BP < 654 mmHg or heart rate < 60 bpm, for added rate control.  Given his cardiomyopathy, prior to discharge, consolidate this to Toprol or change to Coreg.  Digoxin is not a option with his renal dysfunction.  CHADS2VASc at least 3.  Continue anticoagulation with Eliquis.  Will likely need TEE and cardioversion prior to discharge (missed several days of [place] PTA), uncertain if schedule will allow for completion of this tomorrow, may need to schedule for 1/11.  2.  Acute on chronic systolic heart failure due to nonischemic cardiomyopathy: Wilder Glade was held today with worsening renal function. May need to hold IV Lasix. For added ventricular rate control, change Coreg to Lopressor for now as above. Not on ACEi/ARB/ARNI/MRA secondary to acute on CKD.   3.  Acute on chronic CKD stage III: Renal function slightly worse today.  Likely due to combination of heart failure and atrial flutter.  Wilder Glade held today, may need to hold IV Lasix.  4.  Elevated troponin: Likely due to demand ischemia from atrial flutter in the setting of acute on CKD.  No ischemic evaluation at this time given renal dysfunction.      For questions or updates, please contact [facility] [place] Please consult [email or web address] for contact info under        Signed, Christell Faith, PA-C  03/03/2021, 3:13 PM

## 2021-03-03 NOTE — Progress Notes (Signed)
PROGRESS NOTE    Hector [name]  PZW:[id number] DOB: 1965/11/21 DOA: 03/01/2021 PCP: [name], MD    Brief Narrative:  56 year old gentleman with history of nonobstructive coronary artery disease, chronic systolic heart failure, nonischemic cardiomyopathy related to remote alcohol use, known ejection fraction 30 to 35%, paroxysmal a flutter, CKD stage IIIb presented to the ER second time in 2 days with abdominal pain and distention.  Admitted with CHF exacerbation.  Also on rapid A. fib.   Assessment & Plan:   Principal Problem:   Atrial flutter with rapid ventricular response (HCC) Active Problems:   Nonischemic cardiomyopathy: EF 20-25%   Essential hypertension   Acute on chronic systolic CHF (congestive heart failure) (HCC)   Acute renal failure superimposed on stage 3b chronic kidney disease (HCC)  Paroxysmal a flutter with RVR: Symptoms well controlled.  Heart rate elevated but acceptable.  On Coreg.  Started back on Eliquis.  He had missed at least 4 days of therapy. Tolerating well.  Followed by cardiology.  Probable cardioversion.  Acute on chronic systolic congestive heart failure with history of nonischemic cardiomyopathy: Significant symptoms.  Presentation BNP 1449.  Creatinine 3.15-baseline 2.6. Currently on IV diuresis.  Urine output more than 4 L last 24 hours.  Clinically improving. Patient on Farxiga, discontinue with worsening renal functions. Already on carvedilol. Unable to tolerate other heart failure therapy including ACE or ARB because of worsening renal function.  Hypothyroidism: Recent TSH more than 12.  Not taking thyroxine at home.  Resume thyroxine 100 mcg daily.  Will need follow-up in 4 to 6 weeks.  Acute kidney injury with history of chronic kidney disease stage IIIb: Probably due to acute CHF and cardiorenal syndrome.  More than 4 L urine output.  Blood pressures adequate. Will continue diuretics, may help with improving kidney  functions.  Recheck tomorrow morning.    DVT prophylaxis:  apixaban (ELIQUIS) tablet 5 mg   Code Status: Full code Family Communication: None Disposition Plan: Status is: Inpatient  Remains inpatient appropriate because: IV diuresis.         Consultants:  Cardiology  Procedures:  None  Antimicrobials:  None   Subjective:  Patient seen and examined.  Denies any chest pain or shortness of breath.  Belly bloating has improved somehow.  Objective: Vitals:   03/03/21 0500 03/03/21 0714 03/03/21 1041 03/03/21 1042  BP: 97/82 100/83 140/84 107/84  Pulse: (!) 122 (!) 119 (!) 120 (!) 120  Resp: 20 18  (!) 25  Temp:  98.3 F (36.8 C)  98.1 F (36.7 C)  TempSrc:  Oral  Oral  SpO2: 95% 98%  93%  Weight:      Height:        Intake/Output Summary (Last 24 hours) at 03/03/2021 1232 Last data filed at 03/03/2021 0520 Gross per 24 hour  Intake 936 ml  Output 2900 ml  Net -1964 ml   Filed Weights   02/28/21 1945  Weight: 116.1 kg    Examination:  General exam: Appears calm and comfortable  Was eating breakfast.  On room air. Respiratory system: Clear to auscultation. Respiratory effort normal.  Mostly clear bilaterally. Cardiovascular system: S1 & S2 heard, irregularly irregular.  Tachycardic.   Gastrointestinal system: Abdomen is nondistended, soft and nontender. No organomegaly or masses felt. Normal bowel sounds heard. Central nervous system: Alert and oriented. No focal neurological deficits. No peripheral edema.    Data Reviewed: I have personally reviewed following labs and imaging studies  CBC: Recent Labs  Lab 02/26/21 1024 02/28/21 1951 03/03/21 0459  WBC 9.9 7.8 9.4  HGB 13.6 13.7 14.3  HCT 44.7 46.4 46.8  MCV 81.4 83.5 81.4  PLT 242 251 270   Basic Metabolic Panel: Recent Labs  Lab 02/26/21 1024 02/28/21 1951 03/02/21 0638 03/03/21 0459  NA 137 136 137 133*  K 4.4 4.3 3.7 3.9  CL 106 108 102 99  CO2 18* 18* 23 25  GLUCOSE 135* 93  106* 118*  BUN 53* 48* 46* 50*  CREATININE 3.23* 3.15* 3.16* 3.37*  CALCIUM 8.9 8.7* 8.6* 8.4*   GFR: Estimated Creatinine Clearance: 31.7 mL/min (A) (by C-G formula based on SCr of 3.37 mg/dL (H)). Liver Function Tests: Recent Labs  Lab 02/26/21 1024 02/28/21 1951  AST 12* 12*  ALT 10 10  ALKPHOS 85 76  BILITOT 2.1* 2.1*  PROT 7.0 6.9  ALBUMIN 3.9 3.7   Recent Labs  Lab 02/26/21 1024 02/28/21 1951  LIPASE 27 26   No results for input(s): AMMONIA in the last 168 hours. Coagulation Profile: No results for input(s): INR, PROTIME in the last 168 hours. Cardiac Enzymes: No results for input(s): CKTOTAL, CKMB, CKMBINDEX, TROPONINI in the last 168 hours. BNP (last 3 results) No results for input(s): PROBNP in the last 8760 hours. HbA1C: No results for input(s): HGBA1C in the last 72 hours. CBG: No results for input(s): GLUCAP in the last 168 hours. Lipid Profile: No results for input(s): CHOL, HDL, LDLCALC, TRIG, CHOLHDL, LDLDIRECT in the last 72 hours. Thyroid Function Tests: No results for input(s): TSH, T4TOTAL, FREET4, T3FREE, THYROIDAB in the last 72 hours. Anemia Panel: No results for input(s): VITAMINB12, FOLATE, FERRITIN, TIBC, IRON, RETICCTPCT in the last 72 hours. Sepsis Labs: No results for input(s): PROCALCITON, LATICACIDVEN in the last 168 hours.  Recent Results (from the past 240 hour(s))  Resp Panel by RT-PCR (Flu A&B, Covid) Nasopharyngeal Swab     Status: None   Collection Time: 03/01/21 10:47 AM   Specimen: Nasopharyngeal Swab; Nasopharyngeal(NP) swabs in vial transport medium  Result Value Ref Range Status   SARS Coronavirus 2 by RT PCR NEGATIVE NEGATIVE Final    Comment: (NOTE) SARS-CoV-2 target nucleic acids are NOT DETECTED.  The SARS-CoV-2 RNA is generally detectable in upper respiratory specimens during the acute phase of infection. The lowest concentration of SARS-CoV-2 viral copies this assay can detect is 138 copies/mL. A negative result  does not preclude SARS-Cov-2 infection and should not be used as the sole basis for treatment or other patient management decisions. A negative result may occur with  improper specimen collection/handling, submission of specimen other than nasopharyngeal swab, presence of viral mutation(s) within the areas targeted by this assay, and inadequate number of viral copies(<138 copies/mL). A negative result must be combined with clinical observations, patient history, and epidemiological information. The expected result is Negative.  Fact Sheet for Patients:  EntrepreneurPulse.com.au  Fact Sheet for Healthcare Providers:  IncredibleEmployment.be  This test is no t yet approved or cleared by the [place] FDA and  has been authorized for detection and/or diagnosis of SARS-CoV-2 by FDA under an Emergency Use Authorization (EUA). This EUA will remain  in effect (meaning this test can be used) for the duration of the COVID-19 declaration under Section 564(b)(1) of the Act, 21 U.S.C.section 360bbb-3(b)(1), unless the authorization is terminated  or revoked sooner.       Influenza A by PCR NEGATIVE NEGATIVE Final   Influenza B by PCR NEGATIVE NEGATIVE Final    Comment: (  NOTE) The Xpert Xpress SARS-CoV-2/FLU/RSV plus assay is intended as an aid in the diagnosis of influenza from Nasopharyngeal swab specimens and should not be used as a sole basis for treatment. Nasal washings and aspirates are unacceptable for Xpert Xpress SARS-CoV-2/FLU/RSV testing.  Fact Sheet for Patients: EntrepreneurPulse.com.au  Fact Sheet for Healthcare Providers: IncredibleEmployment.be  This test is not yet approved or cleared by the [place] FDA and has been authorized for detection and/or diagnosis of SARS-CoV-2 by FDA under an Emergency Use Authorization (EUA). This EUA will remain in effect (meaning this test can be used) for  the duration of the COVID-19 declaration under Section 564(b)(1) of the Act, 21 U.S.C. section 360bbb-3(b)(1), unless the authorization is terminated or revoked.  Performed at [facility], [address], [place],  [address]          Radiology Studies: No results found.      Scheduled Meds:  apixaban  5 mg Oral BID   atorvastatin  80 mg Oral QHS   carvedilol  12.5 mg Oral BID WC   furosemide  40 mg Intravenous BID   gabapentin  300 mg Oral BID   levothyroxine  100 mcg Oral Q0600   Continuous Infusions:   LOS: 2 days    Time spent: 35 minutes    Barb Merino, MD [facility] Pager [phone]

## 2021-03-03 NOTE — Consult Note (Addendum)
°  Heart Failure Nurse Navigator Note   HFrEF 30-35%  He presented to the emergency room with complaints of abdominal pain/distention and increasing shortness of breath.  He was noted to be in atrial flutter with rates of 125 bpm and blood pressure 143/120.  Comorbidities:  Nonobstructive coronary artery disease Paroxysmal atrial flutter Chronic kidney disease stage III Obstructive sleep apnea  Medications:  Synthroid 100 mg daily Eliquis 5 mg twice a day Neurontin 300 mg twice a day Atorvastatin 80 mg at at bedtime Carvedilol 12 and half milligrams twice a day Furosemide 40 mg IV twice a day  At home he also takes [place] 10 mg and hydralazine 25 mg 2 times a day  Labs:  Sodium 133, potassium 3.9, chloride 98, CO2 25, BUN 50, creatinine 3.37 up from 3.16, BNP on admission was 1449.  Initial meeting with patient on this admission while he is in the emergency room.  He was lying on a gurney in no acute distress.  States he hopes to be going home later today.  Discussed how he takes care of himself at home.  He admits to not having batteries for his scale.  Discussed the importance of daily weight, recording  and reporting 2 to 3 pound weight gain overnight or 5 pounds within the week.  Discussed how he sets up and takes his medications.  Went over his med list.  He states that he is only taking the hydralazine twice a day due to his kidney function.  1 time he had used medication box but after he was hospitalized with COVID he states he has not done that practice since.  He states that he has a table by the chair that he sits in and one half table is the medications that he takes once a day and the other half of the table as the medications that he takes twice a day.  In discussing fluid restriction and diet he did not feel that he went over the 64 ounces in a days time, states that he does not use salt at the table but admitted to some dietary indiscretions a couple times a month  for example, he listed Ramen noodles.  He states he still does not have a new CPAP machine.  This was also noted in office notes back in August 2022.  He has follow-up appointment with our outpatient heart failure clinic on January 19 at 1130.  He has 11% rate of no-show just 10 out of 89 appointments.  He had no further questions.  Pricilla Riffle RN CHFN

## 2021-03-03 NOTE — ED Notes (Addendum)
Messaged IPMD about pt continuing ST at 120. Per MD, no new orders at this time.

## 2021-03-04 LAB — BASIC METABOLIC PANEL
Anion gap: 13 (ref 5–15)
BUN: 52 mg/dL — ABNORMAL HIGH (ref 6–20)
CO2: 24 mmol/L (ref 22–32)
Calcium: 8.7 mg/dL — ABNORMAL LOW (ref 8.9–10.3)
Chloride: 98 mmol/L (ref 98–111)
Creatinine, Ser: 3.38 mg/dL — ABNORMAL HIGH (ref 0.61–1.24)
GFR, Estimated: 20 mL/min — ABNORMAL LOW (ref >60)
Glucose, Bld: 110 mg/dL — ABNORMAL HIGH (ref 70–99)
Potassium: 4.1 mmol/L (ref 3.5–5.1)
Sodium: 135 mmol/L (ref 135–145)

## 2021-03-04 MED ORDER — SODIUM CHLORIDE 0.9% FLUSH
3.0000 mL | Freq: Two times a day (BID) | INTRAVENOUS | Status: DC
  Administered 2021-03-04 – 2021-03-06 (×4): 3 mL via INTRAVENOUS

## 2021-03-04 NOTE — Progress Notes (Signed)
°   03/04/21 1642  Assess: MEWS Score  Temp 97.6 F (36.4 C)  BP 91/73  Pulse Rate (!) 113  SpO2 98 %  O2 Device Nasal Cannula  O2 Flow Rate (L/min) 2 L/min  Assess: MEWS Score  MEWS Temp 0  MEWS Systolic 1  MEWS Pulse 2  MEWS RR 0  MEWS LOC 0  MEWS Score 3  MEWS Score Color Yellow  Assess: if the MEWS score is Yellow or Red  Were vital signs taken at a resting state? Yes  Focused Assessment No change from prior assessment  Does the patient meet 2 or more of the SIRS criteria? No  MEWS guidelines implemented *See Row Information* Yes  Treat  MEWS Interventions Escalated (See documentation below)  Take Vital Signs  Increase Vital Sign Frequency  Yellow: Q 2hr X 2 then Q 4hr X 2, if remains yellow, continue Q 4hrs  Escalate  MEWS: Escalate Yellow: discuss with charge nurse/RN and consider discussing with provider and RRT  Notify: Charge Nurse/RN  Name of Charge Nurse/RN Notified Steph RN  Date Charge Nurse/RN Notified 03/04/21  Time Charge Nurse/RN Notified 1650  Notify: Provider  Provider Name/Title Dr. Ivor Reining  Date Provider Notified 03/04/21  Time Provider Notified 1659  Notification Type Page  Notification Reason Other (Comment) (MEWS level yellow)  Provider response No new orders  Date of Provider Response 03/04/21  Time of Provider Response 1715  Document  Patient Outcome Other (Comment)  Progress note created (see row info) Yes  Assess: SIRS CRITERIA  SIRS Temperature  0  SIRS Pulse 1  SIRS Respirations  0  SIRS WBC 0  SIRS Score Sum  1

## 2021-03-04 NOTE — Progress Notes (Signed)
Progress Note  Patient Name: Hector [name] Date of Encounter: 03/04/2021  [facility] Cardiologist: Ida Rogue, MD   Subjective   He remains in atrial flutter with RVR with ventricular rates in the 80s to low 100s bpm. Documented UOP with a net - 6.5 L for the admission. Renal function remains elevated above his baseline, though is stable. No chest pain. Dyspnea improved.    Inpatient Medications    Scheduled Meds:  apixaban  5 mg Oral BID   atorvastatin  80 mg Oral QHS   furosemide  60 mg Oral Daily   gabapentin  300 mg Oral BID   levothyroxine  100 mcg Oral Q0600   metoprolol tartrate  12.5 mg Oral Q6H   Continuous Infusions:  PRN Meds: acetaminophen **OR** acetaminophen, Muscle Rub   Vital Signs    Vitals:   03/04/21 0423 03/04/21 0530 03/04/21 0724 03/04/21 1204  BP:  110/77 109/87 118/83  Pulse:  (!) 105 (!) 101 (!) 103  Resp:  20 17 18   Temp:  98.5 F (36.9 C) 98.2 F (36.8 C) 98.4 F (36.9 C)  TempSrc:  Oral  Oral  SpO2:  95% 98% 98%  Weight: 115.7 kg     Height:        Intake/Output Summary (Last 24 hours) at 03/04/2021 1318 Last data filed at 03/04/2021 1205 Gross per 24 hour  Intake 1080 ml  Output 1300 ml  Net -220 ml    Last 3 Weights 03/04/2021 02/28/2021 02/26/2021  Weight (lbs) 254 lb 15.7 oz 256 lb 255 lb 15.3 oz  Weight (kg) 115.658 kg 116.121 kg 116.1 kg      Telemetry    Atrial flutter with ventricular rates in the 80s to low 100s bpm - Personally Reviewed  ECG    No new tracings - Personally Reviewed  Physical Exam   GEN: No acute distress.   Neck: No JVD Cardiac: Mildly tachycardic, regular, no murmurs, rubs, or gallops.  Respiratory: Crackles at the lung bases GI: Soft, nontender, non-distended  MS: No edema; No deformity. Neuro:  Nonfocal  Psych: Normal affect   Labs    High Sensitivity Troponin:   Recent Labs  Lab 02/28/21 1951 02/28/21 2203  TROPONINIHS 46* 54*      Chemistry Recent Labs  Lab  02/26/21 1024 02/28/21 1951 03/02/21 0638 03/03/21 0459 03/04/21 0727  NA 137 136 137 133* 135  K 4.4 4.3 3.7 3.9 4.1  CL 106 108 102 99 98  CO2 18* 18* 23 25 24   GLUCOSE 135* 93 106* 118* 110*  BUN 53* 48* 46* 50* 52*  CREATININE 3.23* 3.15* 3.16* 3.37* 3.38*  CALCIUM 8.9 8.7* 8.6* 8.4* 8.7*  PROT 7.0 6.9  --   --   --   ALBUMIN 3.9 3.7  --   --   --   AST 12* 12*  --   --   --   ALT 10 10  --   --   --   ALKPHOS 85 76  --   --   --   BILITOT 2.1* 2.1*  --   --   --   GFRNONAA 22* 22* 22* 21* 20*  ANIONGAP 13 10 12 9 13      Lipids No results for input(s): CHOL, TRIG, HDL, LABVLDL, LDLCALC, CHOLHDL in the last 168 hours.  Hematology Recent Labs  Lab 02/26/21 1024 02/28/21 1951 03/03/21 0459  WBC 9.9 7.8 9.4  RBC 5.49 5.56 5.75  HGB 13.6  13.7 14.3  HCT 44.7 46.4 46.8  MCV 81.4 83.5 81.4  MCH 24.8* 24.6* 24.9*  MCHC 30.4 29.5* 30.6  RDW 20.7* 20.8* 20.4*  PLT 242 251 244    Thyroid No results for input(s): TSH, FREET4 in the last 168 hours.  BNP Recent Labs  Lab 02/28/21 1951  BNP 1,449.1*     DDimer No results for input(s): DDIMER in the last 168 hours.   Radiology    No results found.  Cardiac Studies   TTE 10/30/20  1. Left ventricular ejection fraction, by estimation, is 30 to 35%. The  left ventricle has moderately decreased function. The left ventricle  demonstrates global hypokinesis. The left ventricular internal cavity size  was borderline dilated. There is mild   left ventricular hypertrophy.   2. Right ventricular systolic function is mildly reduced. The right  ventricular size is normal.   3. Mild to moderate mitral valve regurgitation.   Patient Profile     56 y.o. male with a history of chronic combined CHF/NICM likely secondary to alcohol use, HTN, HLD, paroxysmal afib, DM2, hypothyroidism, OSA on CPAP, CKD stage 3, COVID-19 11/2019 requiring hospitalization, medication noncompliance, and GI bleed on Eliquis who we are seeing for atrial  flutter with RVR.   Assessment & Plan    1.  Atrial flutter with RVR: Ventricular rates are slightly improved following the transition from Coreg to Lopressor 12.5 mg q 6 hours yesterday, which will be continued today.  Currently off of amiodarone due to thyroid disease.  Had not been on medications at home leading up to his admission.  Attempt to schedule TEE/DCCV for 1/11, if schedule will allow.  Given his cardiomyopathy, prior to discharge, consolidate this to Toprol or change to Coreg.  Digoxin is not a option with his renal dysfunction.  CHADS2VASc at least 3.  Continue anticoagulation with Eliquis.    2.  Acute on chronic systolic heart failure due to nonischemic cardiomyopathy: Farxiga 1/9 with worsening renal function. Transitioned from IV Lasix to oral Lasix 60 mg today. Lopressor, for now as above. Not on ACEi/ARB/ARNI/MRA secondary to acute on CKD.   3.  Acute on chronic CKD stage III: Renal function stable, though remains above baseline.  No longer on Farxiga or IV Lasix. Likely due to combination of heart failure and atrial flutter.    4.  Elevated troponin: Likely due to demand ischemia from atrial flutter in the setting of acute on CKD.  No ischemic evaluation at this time given renal dysfunction.      For questions or updates, please contact [place] Please consult [email or web address] for contact info under        Signed, Christell Faith, PA-C  03/04/2021, 1:18 PM

## 2021-03-04 NOTE — Plan of Care (Signed)

## 2021-03-04 NOTE — Progress Notes (Signed)
PROGRESS NOTE    Hector [name]  WNU:[id number] DOB: 02-25-1965 DOA: 03/01/2021 PCP: [name], MD    Brief Narrative:  56 year old gentleman with history of nonobstructive coronary artery disease, chronic systolic heart failure, nonischemic cardiomyopathy related to remote alcohol use, known ejection fraction 30 to 35%, paroxysmal a flutter, CKD stage IIIb presented to the ER second time in 2 days with abdominal pain and distention.  Admitted with CHF exacerbation.  Also on rapid A. fib.   Assessment & Plan:   Principal Problem:   Atrial flutter with rapid ventricular response (HCC) Active Problems:   Nonischemic cardiomyopathy: EF 20-25%   Essential hypertension   Acute on chronic systolic CHF (congestive heart failure) (HCC)   Acute renal failure superimposed on stage 3b chronic kidney disease (HCC)  Paroxysmal a flutter with RVR: Heart rate elevated but acceptable.  On Coreg.  Started back on Eliquis.  He had missed at least 4 days of therapy. Tolerating well.  Followed by cardiology.   For cardioversion tomorrow.  Acute on chronic systolic congestive heart failure with history of nonischemic cardiomyopathy: Significant symptoms.  Presentation BNP 1449.  Creatinine 3.15-baseline 2.6. Treated with IV diuretics.  Aggressive urine output.  Changed to oral Lasix 60 mg daily 1/10.   Patient on Farxiga, discontinue with worsening renal functions. Already on carvedilol. Unable to tolerate other heart failure therapy including ACE or ARB because of worsening renal function.  Hypothyroidism: Recent TSH more than 12.  Not taking thyroxine at home.  Resume thyroxine 100 mcg daily.  Will need follow-up in 4 to 6 weeks.  Acute kidney injury with history of chronic kidney disease stage IIIb: Probably due to acute CHF and cardiorenal syndrome. Blood pressures adequate. Creatinine slightly worse but overall is stable.  Recheck tomorrow morning.  Obstructive sleep apnea: Using  CPAP at night.      DVT prophylaxis:  apixaban (ELIQUIS) tablet 5 mg   Code Status: Full code Family Communication: None Disposition Plan: Status is: Inpatient  Remains inpatient appropriate because: Persistent a flutter.  Inpatient cardiac procedure planned.         Consultants:  Cardiology  Procedures:  None  Antimicrobials:  None   Subjective:  Patient seen and examined.  Abdominal pain and bloating improved.  Remains a flutter with heart rate controlled.  Objective: Vitals:   03/04/21 0423 03/04/21 0530 03/04/21 0724 03/04/21 1204  BP:  110/77 109/87 118/83  Pulse:  (!) 105 (!) 101 (!) 103  Resp:  20 17 18   Temp:  98.5 F (36.9 C) 98.2 F (36.8 C) 98.4 F (36.9 C)  TempSrc:  Oral  Oral  SpO2:  95% 98% 98%  Weight: 115.7 kg     Height:        Intake/Output Summary (Last 24 hours) at 03/04/2021 1232 Last data filed at 03/04/2021 1205 Gross per 24 hour  Intake 1080 ml  Output 1300 ml  Net -220 ml   Filed Weights   02/28/21 1945 03/04/21 0423  Weight: 116.1 kg 115.7 kg    Examination:  General exam: Appears calm and comfortable.  Sleeping and snoring on arrival. Respiratory system: Clear to auscultation. Respiratory effort normal.  Mostly clear bilaterally. Cardiovascular system: S1 & S2 heard, irregularly irregular.  Tachycardic.   Gastrointestinal system: Abdomen is nondistended, soft and nontender. No organomegaly or masses felt. Normal bowel sounds heard. Central nervous system: Alert and oriented. No focal neurological deficits. No peripheral edema.    Data Reviewed: I have personally reviewed  following labs and imaging studies  CBC: Recent Labs  Lab 02/26/21 1024 02/28/21 1951 03/03/21 0459  WBC 9.9 7.8 9.4  HGB 13.6 13.7 14.3  HCT 44.7 46.4 46.8  MCV 81.4 83.5 81.4  PLT 242 251 335   Basic Metabolic Panel: Recent Labs  Lab 02/26/21 1024 02/28/21 1951 03/02/21 0638 03/03/21 0459 03/04/21 0727  NA 137 136 137 133* 135   K 4.4 4.3 3.7 3.9 4.1  CL 106 108 102 99 98  CO2 18* 18* 23 25 24   GLUCOSE 135* 93 106* 118* 110*  BUN 53* 48* 46* 50* 52*  CREATININE 3.23* 3.15* 3.16* 3.37* 3.38*  CALCIUM 8.9 8.7* 8.6* 8.4* 8.7*   GFR: Estimated Creatinine Clearance: 31.6 mL/min (A) (by C-G formula based on SCr of 3.38 mg/dL (H)). Liver Function Tests: Recent Labs  Lab 02/26/21 1024 02/28/21 1951  AST 12* 12*  ALT 10 10  ALKPHOS 85 76  BILITOT 2.1* 2.1*  PROT 7.0 6.9  ALBUMIN 3.9 3.7   Recent Labs  Lab 02/26/21 1024 02/28/21 1951  LIPASE 27 26   No results for input(s): AMMONIA in the last 168 hours. Coagulation Profile: No results for input(s): INR, PROTIME in the last 168 hours. Cardiac Enzymes: No results for input(s): CKTOTAL, CKMB, CKMBINDEX, TROPONINI in the last 168 hours. BNP (last 3 results) No results for input(s): PROBNP in the last 8760 hours. HbA1C: No results for input(s): HGBA1C in the last 72 hours. CBG: No results for input(s): GLUCAP in the last 168 hours. Lipid Profile: No results for input(s): CHOL, HDL, LDLCALC, TRIG, CHOLHDL, LDLDIRECT in the last 72 hours. Thyroid Function Tests: No results for input(s): TSH, T4TOTAL, FREET4, T3FREE, THYROIDAB in the last 72 hours. Anemia Panel: No results for input(s): VITAMINB12, FOLATE, FERRITIN, TIBC, IRON, RETICCTPCT in the last 72 hours. Sepsis Labs: No results for input(s): PROCALCITON, LATICACIDVEN in the last 168 hours.  Recent Results (from the past 240 hour(s))  Resp Panel by RT-PCR (Flu A&B, Covid) Nasopharyngeal Swab     Status: None   Collection Time: 03/01/21 10:47 AM   Specimen: Nasopharyngeal Swab; Nasopharyngeal(NP) swabs in vial transport medium  Result Value Ref Range Status   SARS Coronavirus 2 by RT PCR NEGATIVE NEGATIVE Final    Comment: (NOTE) SARS-CoV-2 target nucleic acids are NOT DETECTED.  The SARS-CoV-2 RNA is generally detectable in upper respiratory specimens during the acute phase of infection. The  lowest concentration of SARS-CoV-2 viral copies this assay can detect is 138 copies/mL. A negative result does not preclude SARS-Cov-2 infection and should not be used as the sole basis for treatment or other patient management decisions. A negative result may occur with  improper specimen collection/handling, submission of specimen other than nasopharyngeal swab, presence of viral mutation(s) within the areas targeted by this assay, and inadequate number of viral copies(<138 copies/mL). A negative result must be combined with clinical observations, patient history, and epidemiological information. The expected result is Negative.  Fact Sheet for Patients:  EntrepreneurPulse.com.au  Fact Sheet for Healthcare Providers:  IncredibleEmployment.be  This test is no t yet approved or cleared by the [place] FDA and  has been authorized for detection and/or diagnosis of SARS-CoV-2 by FDA under an Emergency Use Authorization (EUA). This EUA will remain  in effect (meaning this test can be used) for the duration of the COVID-19 declaration under Section 564(b)(1) of the Act, 21 U.S.C.section 360bbb-3(b)(1), unless the authorization is terminated  or revoked sooner.  Influenza A by PCR NEGATIVE NEGATIVE Final   Influenza B by PCR NEGATIVE NEGATIVE Final    Comment: (NOTE) The Xpert Xpress SARS-CoV-2/FLU/RSV plus assay is intended as an aid in the diagnosis of influenza from Nasopharyngeal swab specimens and should not be used as a sole basis for treatment. Nasal washings and aspirates are unacceptable for Xpert Xpress SARS-CoV-2/FLU/RSV testing.  Fact Sheet for Patients: EntrepreneurPulse.com.au  Fact Sheet for Healthcare Providers: IncredibleEmployment.be  This test is not yet approved or cleared by the [place] FDA and has been authorized for detection and/or diagnosis of SARS-CoV-2 by FDA under  an Emergency Use Authorization (EUA). This EUA will remain in effect (meaning this test can be used) for the duration of the COVID-19 declaration under Section 564(b)(1) of the Act, 21 U.S.C. section 360bbb-3(b)(1), unless the authorization is terminated or revoked.  Performed at [facility], [address], [place], [place] [address]          Radiology Studies: No results found.      Scheduled Meds:  apixaban  5 mg Oral BID   atorvastatin  80 mg Oral QHS   furosemide  60 mg Oral Daily   gabapentin  300 mg Oral BID   levothyroxine  100 mcg Oral Q0600   metoprolol tartrate  12.5 mg Oral Q6H   Continuous Infusions:   LOS: 3 days    Time spent: 35 minutes    Barb Merino, MD [facility] Pager [phone]

## 2021-03-04 NOTE — TOC Initial Note (Signed)
Transition of Care [facility]) - Initial/Assessment Note    Patient Details  Name: Hector [name] MRN: [id number] Date of Birth: 1965-04-03  Transition of Care [facility]) CM/SW Contact:    Alberteen Sam, LCSW Phone Number: 03/04/2021, 10:14 AM  Clinical Narrative:                  CSW notes patient known due to recent frequent admissions.   Pt is alert and oriented. Pt lives with his mother. Pt gets rides from friends and family members when he needs to go to a appointment. Pt goes to [facility] for PCP.   Pt gets him meds from [facility] on [address]. Pt has a scale and is aware to weigh himself daily and notify his doctor if any weight gain is indicated.                Expected Discharge Plan: Home/Self Care Barriers to Discharge: Continued Medical Work up   Patient Goals and CMS Choice Patient states their goals for this hospitalization and ongoing recovery are:: to go home CMS Medicare.gov Compare Post Acute Care list provided to:: Patient Choice offered to / list presented to : Patient  Expected Discharge Plan and Services Expected Discharge Plan: Home/Self Care       Living arrangements for the past 2 months: Single Family Home                                      Prior Living Arrangements/Services Living arrangements for the past 2 months: Single Family Home Lives with:: Parents                   Activities of Daily Living Home Assistive Devices/Equipment: Environmental consultant (specify type), Cane (specify quad or straight) ADL Screening (condition at time of admission) Patient's cognitive ability adequate to safely complete daily activities?: Yes Is the patient deaf or have difficulty hearing?: No Does the patient have difficulty seeing, even when wearing glasses/contacts?: No Does the patient have difficulty concentrating, remembering, or making decisions?: No Patient able to express need for assistance with ADLs?: Yes Does the patient have difficulty  dressing or bathing?: Yes Independently performs ADLs?: Yes (appropriate for developmental age) Does the patient have difficulty walking or climbing stairs?: Yes Weakness of Legs: None Weakness of Arms/Hands: Left  Permission Sought/Granted                  Emotional Assessment       Orientation: : Oriented to Self, Oriented to Place, Oriented to  Time, Oriented to Situation Alcohol / Substance Use: Not Applicable Psych Involvement: No (comment)  Admission diagnosis:  CHF exacerbation ([place]) [I50.9] Atrial flutter with rapid ventricular response ([place]) [I48.92] Acute kidney injury superimposed on chronic kidney disease ([place]) [N17.9, N18.9] Hypertension, unspecified type [I10] Acute on chronic congestive heart failure, unspecified heart failure type ([place]) [I50.9] Patient Active Problem List   Diagnosis Date Noted   Atrial flutter with rapid ventricular response ([place]) 03/01/2021   GI bleeding 11/30/2020   Hypothyroidism 11/30/2020   Stroke ([place])    Alcohol abuse    CKD (chronic kidney disease), stage IV ([place])    Chronic systolic CHF (congestive heart failure) ([place])    Iron deficiency anemia    Congestive heart failure (CHF) ([place]) 09/12/2020   CKD (chronic kidney disease) stage 4, GFR 15-29 ml/min ([place]) 07/21/2020   GIB (gastrointestinal bleeding) 07/21/2020  Acute blood loss anemia 07/21/2020   Hyperthyroidism without thyroid nodule 02/03/2020   Acute respiratory distress syndrome (ARDS) due to COVID-19 virus ([place]) 12/31/2019   Acute renal failure superimposed on stage 3b chronic kidney disease ([place]) 12/31/2019   Hyperkalemia 12/31/2019   Hyponatremia 12/31/2019   Acute on chronic systolic CHF (congestive heart failure) ([place]) 07/21/2016   Obstructive sleep apnea 04/21/2016   Nonischemic cardiomyopathy ([place]) 05/26/2015   Chronic combined systolic (congestive) and diastolic (congestive) heart failure ([place]) 04/01/2015   Essential hypertension 04/01/2015   Sinus  bradycardia 01/29/2015   Nonischemic cardiomyopathy: EF 20-25% 09/21/2014   Paroxysmal atrial fibrillation ([place]) 08/16/2014   Paroxysmal atrial flutter ([place]) 08/14/2014    Class: Acute   Chronic kidney disease (CKD), stage III (moderate) ([place]) 07/02/2014   Hypertensive heart disease    Hyperlipidemia    PCP:  [name], MD Pharmacy:   [facility], [place] [address] Phone: [phone] Fax: [phone]  [facility] [address]), [place] [address] Phone: [phone] Fax: [phone]     Social Determinants of Health (SDOH) Interventions    Readmission Risk Interventions Readmission Risk Prevention Plan 09/14/2020 02/01/2020 01/02/2020  Transportation Screening Complete Complete Complete  PCP or Specialist Appt within 5-7 Days - Complete -  PCP or Specialist Appt within 3-5 Days - - Complete  Home Care Screening - Complete -  Medication Review (RN CM) - Complete -  HRI or [place] - - Complete  Social Work Consult for Recovery Care Planning/Counseling - - Complete  Palliative Care Screening - - Not Applicable  Medication Review (RN Care Manager) Complete - Complete  PCP or Specialist appointment within 3-5 days of discharge Complete - -  HRI or Home Care Consult Complete - -  SW Recovery Care/Counseling Consult Complete - -  Palliative Care Screening Not Applicable - -  Skilled Nursing Facility Complete - -  Some recent data might be hidden

## 2021-03-05 ENCOUNTER — Other Ambulatory Visit: Payer: Self-pay

## 2021-03-05 ENCOUNTER — Encounter
Admission: EM | Disposition: A | Discharge status: Home / Self Care | Payer: Self-pay | Source: Home / Self Care | Attending: Internal Medicine

## 2021-03-05 ENCOUNTER — Inpatient Hospital Stay: Payer: Medicaid Other | Admitting: Registered Nurse

## 2021-03-05 ENCOUNTER — Inpatient Hospital Stay (HOSPITAL_COMMUNITY)
Admit: 2021-03-05 | Discharge: 2021-03-05 | Disposition: A | Discharge status: Home / Self Care | Payer: Medicaid Other | Attending: Physician Assistant | Admitting: Physician Assistant

## 2021-03-05 DIAGNOSIS — I5023 Acute on chronic systolic (congestive) heart failure: Secondary | ICD-10-CM

## 2021-03-05 DIAGNOSIS — I340 Nonrheumatic mitral (valve) insufficiency: Secondary | ICD-10-CM

## 2021-03-05 DIAGNOSIS — I4892 Unspecified atrial flutter: Secondary | ICD-10-CM

## 2021-03-05 DIAGNOSIS — I361 Nonrheumatic tricuspid (valve) insufficiency: Secondary | ICD-10-CM

## 2021-03-05 DIAGNOSIS — N179 Acute kidney failure, unspecified: Secondary | ICD-10-CM

## 2021-03-05 DIAGNOSIS — N1832 Chronic kidney disease, stage 3b: Secondary | ICD-10-CM

## 2021-03-05 HISTORY — PX: TEE WITHOUT CARDIOVERSION: SHX5443

## 2021-03-05 HISTORY — PX: CARDIOVERSION: SHX1299

## 2021-03-05 LAB — BASIC METABOLIC PANEL
Anion gap: 9 (ref 5–15)
BUN: 60 mg/dL — ABNORMAL HIGH (ref 6–20)
CO2: 26 mmol/L (ref 22–32)
Calcium: 8.5 mg/dL — ABNORMAL LOW (ref 8.9–10.3)
Chloride: 99 mmol/L (ref 98–111)
Creatinine, Ser: 3.68 mg/dL — ABNORMAL HIGH (ref 0.61–1.24)
GFR, Estimated: 18 mL/min — ABNORMAL LOW (ref >60)
Glucose, Bld: 178 mg/dL — ABNORMAL HIGH (ref 70–99)
Potassium: 4.3 mmol/L (ref 3.5–5.1)
Sodium: 134 mmol/L — ABNORMAL LOW (ref 135–145)

## 2021-03-05 LAB — PROTIME-INR
INR: 1.4 — ABNORMAL HIGH (ref 0.8–1.2)
Prothrombin Time: 16.8 seconds — ABNORMAL HIGH (ref 11.4–15.2)

## 2021-03-05 SURGERY — CARDIOVERSION
Anesthesia: General

## 2021-03-05 SURGERY — ECHOCARDIOGRAM, TRANSESOPHAGEAL
Anesthesia: General

## 2021-03-05 MED ORDER — KETAMINE HCL 10 MG/ML IJ SOLN
INTRAMUSCULAR | Status: DC | PRN
  Administered 2021-03-05: 25 mg via INTRAVENOUS

## 2021-03-05 MED ORDER — PROPOFOL 10 MG/ML IV BOLUS
INTRAVENOUS | Status: DC | PRN
  Administered 2021-03-05 (×2): 10 mg via INTRAVENOUS
  Administered 2021-03-05 (×2): 50 mg via INTRAVENOUS

## 2021-03-05 MED ORDER — EPHEDRINE SULFATE 50 MG/ML IJ SOLN
INTRAMUSCULAR | Status: DC | PRN
  Administered 2021-03-05: 10 mg via INTRAVENOUS

## 2021-03-05 MED ORDER — PHENYLEPHRINE HCL (PRESSORS) 10 MG/ML IV SOLN
INTRAVENOUS | Status: DC | PRN
  Administered 2021-03-05: 80 ug via INTRAVENOUS
  Administered 2021-03-05: 240 ug via INTRAVENOUS
  Administered 2021-03-05 (×2): 160 ug via INTRAVENOUS
  Administered 2021-03-05: 80 ug via INTRAVENOUS

## 2021-03-05 MED ORDER — KETAMINE HCL 50 MG/ML IJ SOLN
INTRAMUSCULAR | Status: AC
  Filled 2021-03-05: qty 10

## 2021-03-05 MED ORDER — SODIUM CHLORIDE 0.9 % IV SOLN: INTRAVENOUS | Status: DC

## 2021-03-05 MED ORDER — ORAL CARE MOUTH RINSE
15.0000 mL | Freq: Two times a day (BID) | OROMUCOSAL | Status: DC
  Administered 2021-03-05 (×2): 15 mL via OROMUCOSAL

## 2021-03-05 MED ORDER — CEFAZOLIN SODIUM-DEXTROSE 2-4 GM/100ML-% IV SOLN
INTRAVENOUS | Status: AC
  Filled 2021-03-05: qty 100

## 2021-03-05 MED ORDER — LIDOCAINE VISCOUS HCL 2 % MT SOLN
OROMUCOSAL | Status: AC
  Administered 2021-03-05: 15 mL
  Filled 2021-03-05: qty 15

## 2021-03-05 NOTE — Anesthesia Preprocedure Evaluation (Addendum)
Anesthesia Evaluation  Patient identified by MRN, date of birth, ID band Patient awake    Reviewed: Allergy & Precautions, NPO status , Patient's Chart, lab work & pertinent test results  History of Anesthesia Complications Negative for: history of anesthetic complications  Airway Mallampati: III       Dental   Pulmonary sleep apnea , former smoker (quit 2000),    Pulmonary exam normal breath sounds clear to auscultation       Cardiovascular hypertension, +CHF (2/2 alcoholic cardiomyopathy, EF 30-35%)  + dysrhythmias (a flutter with RVR on Eliquis at home)  Rhythm:Irregular Rate:Tachycardia  ECG 03/05/21:  Accelerated Junctional rhythm with retrograde conduction Minimal voltage criteria for LVH, may be normal variant ( Cornell product ) Septal infarct , age undetermined Prolonged QT  TTE 10/30/20 1. Left ventricular ejection fraction, by estimation, is 30 to 35%. The left ventricle has moderately decreased function. The left ventricle demonstrates global hypokinesis. The left ventricular internal cavity size was borderline dilated. There is mild left ventricular hypertrophy.  2. Right ventricular systolic function is mildly reduced. The right ventricular size is normal.  3. Mild to moderate mitral valve regurgitation.    Neuro/Psych Alcohol use disorder, last intake 8 years ago CVA (2000), No Residual Symptoms    GI/Hepatic negative GI ROS,   Endo/Other  Hypothyroidism Obesity   Renal/GU Renal disease (AKI on stage III CKD)     Musculoskeletal   Abdominal   Peds  Hematology negative hematology ROS (+)   Anesthesia Other Findings   Reproductive/Obstetrics                            Anesthesia Physical Anesthesia Plan  ASA: 4  Anesthesia Plan: General   Post-op Pain Management:    Induction: Intravenous  PONV Risk Score and Plan: 2 and Propofol infusion, TIVA and Treatment may vary  due to age or medical condition  Airway Management Planned: Natural Airway  Additional Equipment:   Intra-op Plan:   Post-operative Plan:   Informed Consent: I have reviewed the patients History and Physical, chart, labs and discussed the procedure including the risks, benefits and alternatives for the proposed anesthesia with the patient or authorized representative who has indicated his/her understanding and acceptance.       Plan Discussed with: CRNA  Anesthesia Plan Comments: (LMA/GETA backup discussed.  Patient consented for risks of anesthesia including but not limited to:  - adverse reactions to medications - damage to eyes, teeth, lips or other oral mucosa - nerve damage due to positioning  - sore throat or hoarseness - damage to heart, brain, nerves, lungs, other parts of body or loss of life  Informed patient about role of CRNA in peri- and intra-operative care.  Patient voiced understanding.)        Anesthesia Quick Evaluation

## 2021-03-05 NOTE — Progress Notes (Signed)
*  PRELIMINARY RESULTS* Echocardiogram Echocardiogram Transesophageal has been performed.  Sherrie Sport 03/05/2021, 1:42 PM

## 2021-03-05 NOTE — Progress Notes (Signed)
Progress Note  Patient Name: Hector [name] Date of Encounter: 03/05/2021  [facility] Cardiologist: Ida Rogue, MD   Subjective   He remains in atrial flutter with RVR with ventricular rates in the low 100s to 110s bpm. Documented UOP with a net - 7.3 L for the admission. Renal function remains elevated above his baseline, and is trending up today. No chest pain. Dyspnea improved. He is for TEE-guided DCCV later today.   Inpatient Medications    Scheduled Meds:  apixaban  5 mg Oral BID   atorvastatin  80 mg Oral QHS   furosemide  60 mg Oral Daily   gabapentin  300 mg Oral BID   levothyroxine  100 mcg Oral Q0600   mouth rinse  15 mL Mouth Rinse BID   metoprolol tartrate  12.5 mg Oral Q6H   sodium chloride flush  3 mL Intravenous Q12H   Continuous Infusions:  sodium chloride     PRN Meds: acetaminophen **OR** acetaminophen, Muscle Rub   Vital Signs    Vitals:   03/05/21 0052 03/05/21 0400 03/05/21 0451 03/05/21 0504  BP: (!) 111/92 (!) 112/91 94/76   Pulse:      Resp:   18   Temp: 98.1 F (36.7 C)  (!) 97.5 F (36.4 C)   TempSrc: Oral  Oral   SpO2: 99%  95% 99%  Weight:   116.4 kg   Height:        Intake/Output Summary (Last 24 hours) at 03/05/2021 0802 Last data filed at 03/05/2021 0216 Gross per 24 hour  Intake 840 ml  Output 1900 ml  Net -1060 ml    Last 3 Weights 03/05/2021 03/04/2021 02/28/2021  Weight (lbs) 256 lb 9.6 oz 254 lb 15.7 oz 256 lb  Weight (kg) 116.393 kg 115.658 kg 116.121 kg      Telemetry    Atrial flutter with variable AV block with ventricular rates in the low 100s to 110s bpm - Personally Reviewed  ECG    Atrial flutter with RVR, 116 bpm, LVH, nonspecific lateral st/t changes - Personally Reviewed  Physical Exam   GEN: No acute distress.   Neck: No JVD Cardiac: Mildly tachycardic, irregular, no murmurs, rubs, or gallops.  Respiratory: Crackles at the lung bases GI: Soft, nontender, non-distended  MS: No edema; No  deformity. Neuro:  Nonfocal  Psych: Normal affect   Labs    High Sensitivity Troponin:   Recent Labs  Lab 02/28/21 1951 02/28/21 2203  TROPONINIHS 46* 54*      Chemistry Recent Labs  Lab 02/26/21 1024 02/28/21 1951 03/02/21 0638 03/03/21 0459 03/04/21 0727 03/05/21 0056  NA 137 136   < > 133* 135 134*  K 4.4 4.3   < > 3.9 4.1 4.3  CL 106 108   < > 99 98 99  CO2 18* 18*   < > 25 24 26   GLUCOSE 135* 93   < > 118* 110* 178*  BUN 53* 48*   < > 50* 52* 60*  CREATININE 3.23* 3.15*   < > 3.37* 3.38* 3.68*  CALCIUM 8.9 8.7*   < > 8.4* 8.7* 8.5*  PROT 7.0 6.9  --   --   --   --   ALBUMIN 3.9 3.7  --   --   --   --   AST 12* 12*  --   --   --   --   ALT 10 10  --   --   --   --  ALKPHOS 85 76  --   --   --   --   BILITOT 2.1* 2.1*  --   --   --   --   GFRNONAA 22* 22*   < > 21* 20* 18*  ANIONGAP 13 10   < > 9 13 9    < > = values in this interval not displayed.     Lipids No results for input(s): CHOL, TRIG, HDL, LABVLDL, LDLCALC, CHOLHDL in the last 168 hours.  Hematology Recent Labs  Lab 02/26/21 1024 02/28/21 1951 03/03/21 0459  WBC 9.9 7.8 9.4  RBC 5.49 5.56 5.75  HGB 13.6 13.7 14.3  HCT 44.7 46.4 46.8  MCV 81.4 83.5 81.4  MCH 24.8* 24.6* 24.9*  MCHC 30.4 29.5* 30.6  RDW 20.7* 20.8* 20.4*  PLT 242 251 244    Thyroid No results for input(s): TSH, FREET4 in the last 168 hours.  BNP Recent Labs  Lab 02/28/21 1951  BNP 1,449.1*     DDimer No results for input(s): DDIMER in the last 168 hours.   Radiology    No results found.  Cardiac Studies   TTE 10/30/20  1. Left ventricular ejection fraction, by estimation, is 30 to 35%. The  left ventricle has moderately decreased function. The left ventricle  demonstrates global hypokinesis. The left ventricular internal cavity size  was borderline dilated. There is mild   left ventricular hypertrophy.   2. Right ventricular systolic function is mildly reduced. The right  ventricular size is normal.   3.  Mild to moderate mitral valve regurgitation.   Patient Profile     56 y.o. 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   Assessment & Plan    1.  Atrial flutter with RVR: He remains in atrial flutter with ventricular rates slightly improved following the transition from Coreg to Lopressor 12.5 mg q 6 hours on 1/9, which will be continued today.  Currently off of amiodarone due to thyroid disease.  Had not been on medications at home leading up to his admission.  He is for TEE/DCCV later today.  Given his cardiomyopathy, prior to discharge, consolidate this to Toprol or change to Coreg.  Digoxin is not a option with his renal dysfunction.  CHADS2VASc at least 3.  Continue anticoagulation with Eliquis. He does not meet reduced dosing criteria.   2.  Acute on chronic systolic heart failure due to nonischemic cardiomyopathy: Farxiga held 1/9 with worsening renal function. He was transitioned from IV Lasix to oral Lasix 60 mg yesterday. With worsening renal function, hold Lasix today. Short actung Lopressor, for now as above. Not on ACEi/ARB/ARNI/MRA secondary to acute on CKD.   3.  Acute on chronic CKD stage III: Renal function remains above baseline and is trending up today.  No longer on Farxiga. Hold Lasix. Likely due to combination of heart failure and atrial flutter.    4.  Elevated troponin: Likely due to demand ischemia from atrial flutter in the setting of acute on CKD.  No ischemic evaluation at this time given renal dysfunction.      For questions or updates, please contact [place] Please consult [email or web address] for contact info under        Signed, Christell Faith, PA-C  03/05/2021, 8:02 AM

## 2021-03-05 NOTE — Transfer of Care (Signed)
Immediate Anesthesia Transfer of Care Note  Patient: Hector [name]  Procedure(s) Performed: TRANSESOPHAGEAL ECHOCARDIOGRAM (TEE) CARDIOVERSION  Patient Location: [facility]  Anesthesia Type:General  Level of Consciousness: drowsy  Airway & Oxygen Therapy: Patient Spontanous Breathing and Patient connected to nasal cannula oxygen  Post-op Assessment: Report given to RN and Post -op Vital signs reviewed and stable  Post vital signs: Reviewed and stable  Last Vitals:  Vitals Value Taken Time  BP 102/84 03/05/21 1339  Temp    Pulse 73 03/05/21 1341  Resp 18 03/05/21 1341  SpO2 99 % 03/05/21 1341  Vitals shown include unvalidated device data.  Last Pain:  Vitals:   03/05/21 1248  TempSrc: Oral  PainSc: 0-No pain         Complications: No notable events documented.

## 2021-03-05 NOTE — Progress Notes (Signed)
PROGRESS NOTE    Hector [name]  MVH:[id number] DOB: 27-Feb-1965 DOA: 03/01/2021 PCP: [name], MD    Assessment & Plan:   Principal Problem:   Atrial flutter with rapid ventricular response ([facility]) Active Problems:   Nonischemic cardiomyopathy: EF 20-25%   Essential hypertension   Acute on chronic HFrEF (heart failure with reduced ejection fraction) (HCC)   Acute renal failure superimposed on stage 3b chronic kidney disease (HCC)   Paroxysmal a flutter: w/ RVR. Continue on eliquis, coreg. Cardioversion today as per cardio    Acute on chronic systolic CHF: w/ hx of nonischemic cardiomyopathy. Continue on metoprolol. Unable to tolerate ACE-I or ARB secondary to worsening renal function. Monitor I/Os.     Hypothyroidism: continue on home dose of levothyroxine. Will need f/u in 4-6 weeks    AKI on CKDIIIb: possibly due to acute CHF and cardiorenal syndrome. Cr is trending up today. If Cr continues to rise, will consult nephro    OSA: CPAP qhs    DVT prophylaxis: eliquis  Code Status: full  Family Communication:  Disposition Plan: likely d/c back home   Level of care: Telemetry Cardiac  Status is: Inpatient  Remains inpatient appropriate because: getting a cardioversion today      Consultants:  Cardio  Procedures:   Antimicrobials:   Subjective: Pt c/o fatigue   Objective: Vitals:   03/05/21 1333 03/05/21 1334 03/05/21 1335 03/05/21 1336  BP: (!) 74/45   (!) 78/46  Pulse: 83 64 66 67  Resp: 20 20 18 20   Temp:      TempSrc:      SpO2: (!) 75% 93% 94% 92%  Weight:      Height:        Intake/Output Summary (Last 24 hours) at 03/05/2021 1354 Last data filed at 03/05/2021 1336 Gross per 24 hour  Intake 540 ml  Output 1475 ml  Net -935 ml   Filed Weights   03/04/21 0423 03/05/21 0451 03/05/21 1248  Weight: 115.7 kg 116.4 kg 116.1 kg    Examination:  General exam: Appears calm and comfortable  Respiratory system: Clear to auscultation.  Respiratory effort normal. Cardiovascular system: irregularly irregular. No rubs, gallops or clicks.  Gastrointestinal system: Abdomen is nondistended, soft and nontender. Normal bowel sounds heard. Central nervous system: Alert and oriented. Moves all extremities  Psychiatry: Judgement and insight appear normal. Flat mood and affect     Data Reviewed: I have personally reviewed following labs and imaging studies  CBC: Recent Labs  Lab 02/28/21 1951 03/03/21 0459  WBC 7.8 9.4  HGB 13.7 14.3  HCT 46.4 46.8  MCV 83.5 81.4  PLT 251 841   Basic Metabolic Panel: Recent Labs  Lab 02/28/21 1951 03/02/21 0638 03/03/21 0459 03/04/21 0727 03/05/21 0056  NA 136 137 133* 135 134*  K 4.3 3.7 3.9 4.1 4.3  CL 108 102 99 98 99  CO2 18* 23 25 24 26   GLUCOSE 93 106* 118* 110* 178*  BUN 48* 46* 50* 52* 60*  CREATININE 3.15* 3.16* 3.37* 3.38* 3.68*  CALCIUM 8.7* 8.6* 8.4* 8.7* 8.5*   GFR: Estimated Creatinine Clearance: 29 mL/min (A) (by C-G formula based on SCr of 3.68 mg/dL (H)). Liver Function Tests: Recent Labs  Lab 02/28/21 1951  AST 12*  ALT 10  ALKPHOS 76  BILITOT 2.1*  PROT 6.9  ALBUMIN 3.7   Recent Labs  Lab 02/28/21 1951  LIPASE 26   No results for input(s): AMMONIA in the last 168 hours.  Coagulation Profile: Recent Labs  Lab 03/05/21 0056  INR 1.4*   Cardiac Enzymes: No results for input(s): CKTOTAL, CKMB, CKMBINDEX, TROPONINI in the last 168 hours. BNP (last 3 results) No results for input(s): PROBNP in the last 8760 hours. HbA1C: No results for input(s): HGBA1C in the last 72 hours. CBG: No results for input(s): GLUCAP in the last 168 hours. Lipid Profile: No results for input(s): CHOL, HDL, LDLCALC, TRIG, CHOLHDL, LDLDIRECT in the last 72 hours. Thyroid Function Tests: No results for input(s): TSH, T4TOTAL, FREET4, T3FREE, THYROIDAB in the last 72 hours. Anemia Panel: No results for input(s): VITAMINB12, FOLATE, FERRITIN, TIBC, IRON, RETICCTPCT in  the last 72 hours. Sepsis Labs: No results for input(s): PROCALCITON, LATICACIDVEN in the last 168 hours.  Recent Results (from the past 240 hour(s))  Resp Panel by RT-PCR (Flu A&B, Covid) Nasopharyngeal Swab     Status: None   Collection Time: 03/01/21 10:47 AM   Specimen: Nasopharyngeal Swab; Nasopharyngeal(NP) swabs in vial transport medium  Result Value Ref Range Status   SARS Coronavirus 2 by RT PCR NEGATIVE NEGATIVE Final    Comment: (NOTE) SARS-CoV-2 target nucleic acids are NOT DETECTED.  The SARS-CoV-2 RNA is generally detectable in upper respiratory specimens during the acute phase of infection. The lowest concentration of SARS-CoV-2 viral copies this assay can detect is 138 copies/mL. A negative result does not preclude SARS-Cov-2 infection and should not be used as the sole basis for treatment or other patient management decisions. A negative result may occur with  improper specimen collection/handling, submission of specimen other than nasopharyngeal swab, presence of viral mutation(s) within the areas targeted by this assay, and inadequate number of viral copies(<138 copies/mL). A negative result must be combined with clinical observations, patient history, and epidemiological information. The expected result is Negative.  Fact Sheet for Patients:  EntrepreneurPulse.com.au  Fact Sheet for Healthcare Providers:  IncredibleEmployment.be  This test is no t yet approved or cleared by the [place] FDA and  has been authorized for detection and/or diagnosis of SARS-CoV-2 by FDA under an Emergency Use Authorization (EUA). This EUA will remain  in effect (meaning this test can be used) for the duration of the COVID-19 declaration under Section 564(b)(1) of the Act, 21 U.S.C.section 360bbb-3(b)(1), unless the authorization is terminated  or revoked sooner.       Influenza A by PCR NEGATIVE NEGATIVE Final   Influenza B by PCR  NEGATIVE NEGATIVE Final    Comment: (NOTE) The Xpert Xpress SARS-CoV-2/FLU/RSV plus assay is intended as an aid in the diagnosis of influenza from Nasopharyngeal swab specimens and should not be used as a sole basis for treatment. Nasal washings and aspirates are unacceptable for Xpert Xpress SARS-CoV-2/FLU/RSV testing.  Fact Sheet for Patients: EntrepreneurPulse.com.au  Fact Sheet for Healthcare Providers: IncredibleEmployment.be  This test is not yet approved or cleared by the [place] FDA and has been authorized for detection and/or diagnosis of SARS-CoV-2 by FDA under an Emergency Use Authorization (EUA). This EUA will remain in effect (meaning this test can be used) for the duration of the COVID-19 declaration under Section 564(b)(1) of the Act, 21 U.S.C. section 360bbb-3(b)(1), unless the authorization is terminated or revoked.  Performed at [facility], [address], [place], [place] [address]          Radiology Studies: No results found.      Scheduled Meds:  [MAR Hold] apixaban  5 mg Oral BID   [MAR Hold] atorvastatin  80 mg Oral QHS   [  MAR Hold] gabapentin  300 mg Oral BID   [MAR Hold] levothyroxine  100 mcg Oral Q0600   [MAR Hold] mouth rinse  15 mL Mouth Rinse BID   [MAR Hold] metoprolol tartrate  12.5 mg Oral Q6H   [MAR Hold] sodium chloride flush  3 mL Intravenous Q12H   Continuous Infusions:  sodium chloride 20 mL/hr at 03/05/21 1306   ceFAZolin       LOS: 4 days    Time spent: 25 mins     Wyvonnia Dusky, MD [facility] Pager [phone]  If 7PM-7AM, please contact night-coverage 03/05/2021, 1:54 PM

## 2021-03-05 NOTE — Procedures (Signed)
Transesophageal Echocardiogram :  Indication: atrial flutter  Procedure: 10 ml of viscous lidocaine were given orally to provide local anesthesia to the oropharynx. The patient was positioned supine on the left side, bite block provided. The patient was moderately sedated with the doses of versed and fentanyl as detailed below.  Using digital technique an omniplane probe was advanced into the esophagus without incident.   Moderate sedation: 1. Sedation used:  propofol per anesthesia team  See report in EPIC  for complete details: In brief, imaging revealed severely reduced EF, no mural apical thrombus. Estimated ejection fraction was 25%.  Moderately reduced RV function  Imaging of the septum showed no ASD or VSD Bubble study was negative for shunt 2D and color flow confirmed no PFO  The LA was well visualized in orthogonal views.  There was no thrombus in the LA and LA appendage   The descending thoracic aorta had no  mural aortic debris with no evidence of aneurysmal dilation or disection  Cardioversion procedure note For atrial flutter  Procedure Details:  Consent: Risks of procedure as well as the alternatives and risks of each were explained to the (patient/caregiver).  Consent for procedure obtained.  Time Out: Verified patient identification, verified procedure, site/side was marked, verified correct patient position, special equipment/implants available, medications/allergies/relevent history reviewed, required imaging and test results available.  Performed  Patient placed on cardiac monitor, pulse oximetry, supplemental oxygen as necessary.   Sedation given: propofol IV per anesthesia te4am Pacer pads placed anterior and posterior chest.   Cardioverted 1 time(s).   Cardioverted at  150J. Synchronized biphasic Converted to NSR  Evaluation: Findings: Post procedure EKG shows: NSR/SB Complications: None Patient did tolerate procedure well.  Time Spent Directly with  the Patient:  25 minutes   Kate Sable, M.D.    Aaron Edelman Agbor-Etang 03/05/2021 1:51 PM

## 2021-03-06 ENCOUNTER — Encounter: Payer: Self-pay | Admitting: Cardiology

## 2021-03-06 DIAGNOSIS — N189 Chronic kidney disease, unspecified: Secondary | ICD-10-CM

## 2021-03-06 DIAGNOSIS — I428 Other cardiomyopathies: Secondary | ICD-10-CM

## 2021-03-06 DIAGNOSIS — I509 Heart failure, unspecified: Secondary | ICD-10-CM

## 2021-03-06 DIAGNOSIS — I10 Essential (primary) hypertension: Secondary | ICD-10-CM

## 2021-03-06 LAB — CBC
HCT: 43.7 % (ref 39.0–52.0)
Hemoglobin: 13.4 g/dL (ref 13.0–17.0)
MCH: 24.9 pg — ABNORMAL LOW (ref 26.0–34.0)
MCHC: 30.7 g/dL (ref 30.0–36.0)
MCV: 81.1 fL (ref 80.0–100.0)
Platelets: 246 10*3/uL (ref 150–400)
RBC: 5.39 MIL/uL (ref 4.22–5.81)
RDW: 20.3 % — ABNORMAL HIGH (ref 11.5–15.5)
WBC: 8 10*3/uL (ref 4.0–10.5)
nRBC: 0 % (ref 0.0–0.2)

## 2021-03-06 LAB — BASIC METABOLIC PANEL
Anion gap: 10 (ref 5–15)
BUN: 67 mg/dL — ABNORMAL HIGH (ref 6–20)
CO2: 23 mmol/L (ref 22–32)
Calcium: 8.8 mg/dL — ABNORMAL LOW (ref 8.9–10.3)
Chloride: 101 mmol/L (ref 98–111)
Creatinine, Ser: 3.4 mg/dL — ABNORMAL HIGH (ref 0.61–1.24)
GFR, Estimated: 20 mL/min — ABNORMAL LOW (ref >60)
Glucose, Bld: 201 mg/dL — ABNORMAL HIGH (ref 70–99)
Potassium: 4.9 mmol/L (ref 3.5–5.1)
Sodium: 134 mmol/L — ABNORMAL LOW (ref 135–145)

## 2021-03-06 MED ORDER — DAPAGLIFLOZIN PROPANEDIOL 10 MG PO TABS: 10.0000 mg | ORAL_TABLET | Freq: Every day | ORAL | 11 refills | Status: DC

## 2021-03-06 MED ORDER — METOPROLOL SUCCINATE ER 25 MG PO TB24: 12.5000 mg | ORAL_TABLET | Freq: Every day | ORAL | 0 refills | Status: DC

## 2021-03-06 MED ORDER — FUROSEMIDE 40 MG PO TABS: 40.0000 mg | ORAL_TABLET | Freq: Every day | ORAL | 5 refills | Status: DC

## 2021-03-06 MED ORDER — METOPROLOL SUCCINATE ER 25 MG PO TB24: 12.5000 mg | ORAL_TABLET | Freq: Every day | ORAL | Status: DC

## 2021-03-06 NOTE — Anesthesia Postprocedure Evaluation (Signed)
Anesthesia Post Note  Patient: Hector [name]  Procedure(s) Performed: TRANSESOPHAGEAL ECHOCARDIOGRAM (TEE) CARDIOVERSION  Patient location during evaluation: PACU Anesthesia Type: General Level of consciousness: awake and alert, oriented and patient cooperative Pain management: pain level controlled Vital Signs Assessment: post-procedure vital signs reviewed and stable Respiratory status: spontaneous breathing, nonlabored ventilation and respiratory function stable Cardiovascular status: blood pressure returned to baseline and stable Postop Assessment: adequate PO intake Anesthetic complications: no   No notable events documented.   Last Vitals:  Vitals:   03/06/21 1000 03/06/21 1228  BP: 90/78 121/84  Pulse: 71 69  Resp:  18  Temp:  36.9 C  SpO2: 93% 100%    Last Pain:  Vitals:   03/06/21 1228  TempSrc: Oral  PainSc:                  Darrin Nipper

## 2021-03-06 NOTE — Discharge Summary (Signed)
Physician Discharge Summary  Hector [name] PPI:[id number] DOB: November 03, 1965 DOA: 03/01/2021  PCP: [name], MD  Admit date: 03/01/2021 Discharge date: 03/06/2021  Admitted From: home  Disposition:  home   Recommendations for Outpatient Follow-up:  Follow up with PCP w/in 5 days & will  F/u w/ cardio, Dr. Rockey Situ, in 1-2 weeks F/u w/ nephro in 1 week   Home Health: no  Equipment/Devices:  Discharge Condition: stable  CODE STATUS: full  Diet recommendation: Heart Healthy / carb modified   Brief/Interim Summary: HPI was taken from Dr. Damita Dunnings: Hector [name] is a 56 y.o. 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 flutter not currently on anticoagulation, CKD lllb,  hx of GI bleed 2015, OSA, Presents to the emergency room for the second time in 2 days with a complaint of abdominal pain and distention.  On his first visit on 1/4 he had a CT abdomen that was nonacute though did show suggestion of possible CHF.  He was treated with pain medicine and discharge.  He returns now complaining of shortness of breath and sensation of increased abdominal distention with pain.  He denies nausea and vomiting.  Denies cough, fever or chills.   ED course: On arrival tachycardic to 125 with BP as high as 143/120 O2 sat was 98% on room air with desaturations to 90% with exertion Blood work: Troponin 46-54 with a BNP 1449 Creatinine 3.15, up from baseline of 2.6 CBC unremarkable   EKG with rapid a flutter at 124   Chest x-ray compared to 02/26/2021: Unchanged cardiomegaly.  Unchanged pulmonary edema without progression   Patient treated with a dose of IV Lasix given a diltiazem bolus and infusion.  Hospitalist consulted for admission.   As per Dr. 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  Admitted with CHF exacerbation.  Also on rapid A. fib.   As per Dr. Jimmye Norman 1/11-1/12/23: Pt had a successful cardioversion on 03/05/21 as per cardio. Pt was d/c home on metoprolol and eliquis as per cardio. Of note, pt's Cr was labile throughout hospital stay was trending down at 3.40 on the day of d/c. Pt does see a nephrologist here in [place] but cannot remember their name. Pt understands that he should see his nephrologist w/in 1 week. Pt verbalized his understanding. For more information, please see previous progress/consult notes.   Discharge Diagnoses:  Principal Problem:   Atrial flutter with rapid ventricular response (HCC) Active Problems:   Nonischemic cardiomyopathy: EF 20-25%   Essential hypertension   Acute on chronic HFrEF (heart failure with reduced ejection fraction) (HCC)   Acute renal failure superimposed on stage 3b chronic kidney disease (HCC)  Paroxysmal a flutter: w/ RVR. Continue on eliquis, metoprolol. S/p successful cardioversion 03/05/21 as per cardio   Acute on chronic systolic CHF: w/ hx of nonischemic cardiomyopathy. Continue on metoprolol. Holding lasix secondary to AKI on CKDIIIb. Unable to tolerate ACE-I or ARB secondary to worsening renal function. Monitor I/Os.     Hypothyroidism: continue on home dose of levothyroxine. Will need f/u in 4-6 weeks    AKI on CKDIIIb: possibly due to acute CHF and cardiorenal syndrome. Cr is trending up  today. If Cr continues to rise, will consult nephro    OSA: CPAP qhs   DM2: HbA1c 7.7, poorly controlled. Wilder Glade is on hold secondary to AKI on CKDIIIb. May need to be started on another anti-DM meds that minimally effects Cr/GFR   Obesity: BMI 35.7. Complicates overall care & prognosis   Discharge  Instructions  Discharge Instructions     Diet - low sodium heart healthy   Complete by: As directed    Discharge instructions   Complete by: As directed    F/u w/ PCP w/in 5 days & you will likely need to be placed on another medication for diabetes.  F/u w/ cardio, Dr. Rockey Situ, in 1-2 weeks. F/u w/ nephro w/in 1 week   Increase activity slowly   Complete by: As directed       Allergies as of 03/06/2021       Reactions   Esomeprazole Magnesium Other (See Comments)   Suspected interstitial nephritis 2018   Eggs Or Egg-derived Products Rash        Medication List     STOP taking these medications    allopurinol 100 MG tablet Commonly known as: ZYLOPRIM   carvedilol 12.5 MG tablet Commonly known as: COREG   hydrALAZINE 25 MG tablet Commonly known as: APRESOLINE   oxyCODONE-acetaminophen 5-325 MG tablet Commonly known as: Percocet       TAKE these medications    apixaban 5 MG Tabs tablet Commonly known as: ELIQUIS Take 1 tablet (5 mg total) by mouth 2 (two) times daily.   atorvastatin 80 MG tablet Commonly known as: LIPITOR Take 1 tablet (80 mg total) by mouth daily.   dapagliflozin propanediol 10 MG Tabs tablet Commonly known as: Farxiga Take 1 tablet (10 mg total) by mouth daily before breakfast. Hold this medication until you see your cardiologist and/or nephrologist What changed: additional instructions   ferrous gluconate 324 MG tablet Commonly known as: FERGON Take 324 mg by mouth daily with breakfast.   furosemide 40 MG tablet Commonly known as: LASIX Take 1 tablet (40 mg total) by mouth daily. Hold this medication until you see your cardiologist and/or nephrologist What changed: additional instructions   gabapentin 300 MG capsule Commonly known as: NEURONTIN Take 300 mg by mouth 2 (two) times daily.   levothyroxine 100 MCG tablet Commonly known as: SYNTHROID Take 100 mcg by mouth daily before breakfast.   metoprolol succinate 25 MG 24 hr  tablet Commonly known as: TOPROL-XL Take 0.5 tablets (12.5 mg total) by mouth daily. Start taking on: March 07, 2021        Allergies  Allergen Reactions   Esomeprazole Magnesium Other (See Comments)    Suspected interstitial nephritis 2018   Eggs Or Egg-Derived Products Rash    Consultations: Cardio    Procedures/Studies: CT ABDOMEN PELVIS WO CONTRAST  Result Date: 02/26/2021 CLINICAL DATA:  Abdominal pain and vomiting. EXAM: CT ABDOMEN AND PELVIS WITHOUT CONTRAST TECHNIQUE: Multidetector CT imaging of the abdomen and pelvis was performed following the standard protocol without IV contrast. COMPARISON:  06/19/2020 FINDINGS: Lower chest: No acute abnormality. There is interlobular septal thickening identified within the visualized lung bases. Scar is noted within the right middle lobe. Posterior right lower lobe granuloma identified, image 4/3. Interlobular septal thickening within the periphery of the right lower lobe is likely postinflammatory. Hepatobiliary: No focal liver abnormality identified. Stone within the dependent portion of the gallbladder measures 3 mm. No gallbladder wall thickening or inflammation. No bile duct dilatation. Pancreas: Unremarkable.  No pancreatic ductal dilatation or surrounding inflammatory changes. Spleen: Normal in size without focal abnormality. Adrenals/Urinary Tract: Normal adrenal glands. Bilateral kidney cysts are again noted. The largest arises off the lateral cortex of the interpolar right kidney measuring 5.3 cm. No kidney stones or hydronephrosis identified bilaterally. No hydroureter or ureteral calculi. Partially decompressed urinary bladder is identified with circumferential wall thickening. Stomach/Bowel: The stomach appears within normal limits. The appendix is visualized and appears within normal limits. Colonic diverticulosis identified. No signs of acute diverticulitis. No bowel wall thickening, inflammation or distension. Vascular/Lymphatic:  Mild aortic atherosclerosis. No aneurysm. No abdominopelvic adenopathy identified. Reproductive: Prostate is unremarkable. Other: No abdominal wall hernia or abnormality. No abdominopelvic ascites. Musculoskeletal: No acute or significant osseous findings. IMPRESSION: 1. No acute findings identified within the abdomen or pelvis. 2. There is mild interlobular septal thickening within the visualized lung bases which may reflect early pulmonary edema. Correlate for clinical signs or symptoms of CHF. 3. Bilateral kidney cysts. 4. Gallstone. 5. Aortic Atherosclerosis (ICD10-I70.0). Electronically Signed   By: Kerby Moors M.D.   On: 02/26/2021 13:23   DG Chest 2 View  Result Date: 02/28/2021 CLINICAL DATA:  Generalized abdominal pain. Evaluate for worsening pulmonary edema. EXAM: CHEST - 2 VIEW COMPARISON:  Radiograph 2 days ago 02/26/2021 FINDINGS: Unchanged cardiomegaly. Pulmonary edema is similar to prior exam. No significant progression. No confluent consolidation. Trace fluid in the fissures without significant sub pulmonic effusion. No pneumothorax. Stable osseous structures. IMPRESSION: 1. Unchanged cardiomegaly. 2. Unchanged pulmonary edema without progression. Electronically Signed   By: Keith Rake M.D.   On: 02/28/2021 23:58   DG Chest Port 1 View  Result Date: 02/26/2021 CLINICAL DATA:  Edema EXAM: PORTABLE CHEST 1 VIEW COMPARISON:  Chest radiograph dated September 22, 2020 FINDINGS: The heart is enlarged. Pulmonary vascular congestion without evidence of frank pulmonary edema. No pleural effusion or pneumothorax. Eventration of the right hemidiaphragm, unchanged. IMPRESSION: Cardiomegaly with pulmonary vascular congestion. Electronically Signed   By: Keane Police D.O.   On: 02/26/2021 14:12   ECHO TEE  Result Date: 03/05/2021    TRANSESOPHOGEAL ECHO REPORT   Patient Name:   THURSTON BRENDLINGER Date of Exam: 03/05/2021 Medical Rec #:  [id number]           Height:       71.0 in Accession #:     [id number]          Weight:       256.0 lb Date of Birth:  1965-08-28           BSA:          2.342 m Patient Age:    56 years            BP:           111/92 mmHg Patient Gender: M                   HR:           116 bpm. Exam Location:  [facility] Procedure: Transesophageal Echo, Color Doppler, Cardiac Doppler and Saline            Contrast Bubble Study Indications:     Atrial Flutter I48.92  History:         Patient has prior history of Echocardiogram examinations, most                  recent 10/29/2020. Risk Factors:Dyslipidemia. Alcoholic  cardiomyopathy, Paroxysmal A-fib.  Sonographer:     Sherrie Sport Referring Phys:  [id number] [place] Diagnosing Phys: Kate Sable MD PROCEDURE: The transesophogeal probe was passed without difficulty through the esophogus of the patient. Sedation performed by different physician. The patient developed no complications during the procedure. A successful direct current cardioversion was  performed at 150 joules with 1 attempt. IMPRESSIONS  1. Left ventricular ejection fraction, by estimation, is <20%. The left ventricle has severely decreased function. The left ventricular internal cavity size was mildly dilated.  2. Right ventricular systolic function is severely reduced. The right ventricular size is normal.  3. Left atrial size was mildly dilated. No left atrial/left atrial appendage thrombus was detected.  4. The mitral valve is normal in structure. Mild to moderate mitral valve regurgitation.  5. The aortic valve is tricuspid. Aortic valve regurgitation is not visualized.  6. Agitated saline contrast bubble study was negative, with no evidence of any interatrial shunt. FINDINGS  Left Ventricle: Left ventricular ejection fraction, by estimation, is <20%. The left ventricle has severely decreased function. The left ventricular internal cavity size was mildly dilated. Right Ventricle: The right ventricular size is normal. No increase in right ventricular wall  thickness. Right ventricular systolic function is severely reduced. Left Atrium: Left atrial size was mildly dilated. Spontaneous echo contrast was present. No left atrial/left atrial appendage thrombus was detected. Right Atrium: Right atrial size was normal in size. Pericardium: There is no evidence of pericardial effusion. Mitral Valve: The mitral valve is normal in structure. Mild to moderate mitral valve regurgitation. Tricuspid Valve: The tricuspid valve is normal in structure. Tricuspid valve regurgitation is mild. Aortic Valve: The aortic valve is tricuspid. Aortic valve regurgitation is not visualized. Pulmonic Valve: The pulmonic valve was not well visualized. Pulmonic valve regurgitation is not visualized. Aorta: The aortic root is normal in size and structure. IAS/Shunts: No atrial level shunt detected by color flow Doppler. Agitated saline contrast was given intravenously to evaluate for intracardiac shunting. Agitated saline contrast bubble study was negative, with no evidence of any interatrial shunt. Kate Sable MD Electronically signed by Kate Sable MD Signature Date/Time: 03/05/2021/6:21:33 PM    Final    (Echo, Carotid, EGD, Colonoscopy, ERCP)    Subjective: Pt c/o fatigue    Discharge Exam: Vitals:   03/06/21 0824 03/06/21 1000  BP: 91/75 90/78  Pulse: 61 71  Resp: 20   Temp: 98 F (36.7 C)   SpO2: 95% 93%   Vitals:   03/06/21 0000 03/06/21 0518 03/06/21 0824 03/06/21 1000  BP: 104/80 100/81 91/75 90/78   Pulse: 72 69 61 71  Resp: 18 18 20    Temp: 99 F (37.2 C) 98.9 F (37.2 C) 98 F (36.7 C)   TempSrc: Oral Oral Oral   SpO2: 96% 98% 95% 93%  Weight:      Height:        General: Pt is alert, awake, not in acute distress Cardiovascular: S1/S2 +, no rubs, no gallops Respiratory: CTA bilaterally, no wheezing, no rhonchi Abdominal: Soft, NT, obese, bowel sounds + Extremities: no edema, no cyanosis    The results of significant diagnostics from this  hospitalization (including imaging, microbiology, ancillary and laboratory) are listed below for reference.     Microbiology: Recent Results (from the past 240 hour(s))  Resp Panel by RT-PCR (Flu A&B, Covid) Nasopharyngeal Swab     Status: None   Collection Time: 03/01/21 10:47 AM   Specimen: Nasopharyngeal Swab; Nasopharyngeal(NP) swabs in vial transport  medium  Result Value Ref Range Status   SARS Coronavirus 2 by RT PCR NEGATIVE NEGATIVE Final    Comment: (NOTE) SARS-CoV-2 target nucleic acids are NOT DETECTED.  The SARS-CoV-2 RNA is generally detectable in upper respiratory specimens during the acute phase of infection. The lowest concentration of SARS-CoV-2 viral copies this assay can detect is 138 copies/mL. A negative result does not preclude SARS-Cov-2 infection and should not be used as the sole basis for treatment or other patient management decisions. A negative result may occur with  improper specimen collection/handling, submission of specimen other than nasopharyngeal swab, presence of viral mutation(s) within the areas targeted by this assay, and inadequate number of viral copies(<138 copies/mL). A negative result must be combined with clinical observations, patient history, and epidemiological information. The expected result is Negative.  Fact Sheet for Patients:  EntrepreneurPulse.com.au  Fact Sheet for Healthcare Providers:  IncredibleEmployment.be  This test is no t yet approved or cleared by the [place] FDA and  has been authorized for detection and/or diagnosis of SARS-CoV-2 by FDA under an Emergency Use Authorization (EUA). This EUA will remain  in effect (meaning this test can be used) for the duration of the COVID-19 declaration under Section 564(b)(1) of the Act, 21 U.S.C.section 360bbb-3(b)(1), unless the authorization is terminated  or revoked sooner.       Influenza A by PCR NEGATIVE NEGATIVE Final    Influenza B by PCR NEGATIVE NEGATIVE Final    Comment: (NOTE) The Xpert Xpress SARS-CoV-2/FLU/RSV plus assay is intended as an aid in the diagnosis of influenza from Nasopharyngeal swab specimens and should not be used as a sole basis for treatment. Nasal washings and aspirates are unacceptable for Xpert Xpress SARS-CoV-2/FLU/RSV testing.  Fact Sheet for Patients: EntrepreneurPulse.com.au  Fact Sheet for Healthcare Providers: IncredibleEmployment.be  This test is not yet approved or cleared by the [place] FDA and has been authorized for detection and/or diagnosis of SARS-CoV-2 by FDA under an Emergency Use Authorization (EUA). This EUA will remain in effect (meaning this test can be used) for the duration of the COVID-19 declaration under Section 564(b)(1) of the Act, 21 U.S.C. section 360bbb-3(b)(1), unless the authorization is terminated or revoked.  Performed at [facility], [address] [place], [place] [address]      Labs: BNP (last 3 results) Recent Labs    10/08/20 1451 11/30/20 1117 02/28/21 1951  BNP 561.2* 948.8* 5,003.7*   Basic Metabolic Panel: Recent Labs  Lab 03/02/21 0488 03/03/21 0459 03/04/21 0727 03/05/21 0056 03/06/21 0944  NA 137 133* 135 134* 134*  K 3.7 3.9 4.1 4.3 4.9  CL 102 99 98 99 101  CO2 23 25 24 26 23   GLUCOSE 106* 118* 110* 178* 201*  BUN 46* 50* 52* 60* 67*  CREATININE 3.16* 3.37* 3.38* 3.68* 3.40*  CALCIUM 8.6* 8.4* 8.7* 8.5* 8.8*   Liver Function Tests: Recent Labs  Lab 02/28/21 1951  AST 12*  ALT 10  ALKPHOS 76  BILITOT 2.1*  PROT 6.9  ALBUMIN 3.7   Recent Labs  Lab 02/28/21 1951  LIPASE 26   No results for input(s): AMMONIA in the last 168 hours. CBC: Recent Labs  Lab 02/28/21 1951 03/03/21 0459 03/06/21 0944  WBC 7.8 9.4 8.0  HGB 13.7 14.3 13.4  HCT 46.4 46.8 43.7  MCV 83.5 81.4 81.1  PLT 251 244 246   Cardiac Enzymes: No results for input(s):  CKTOTAL, CKMB, CKMBINDEX, TROPONINI in the last 168 hours. BNP: Invalid input(s):  POCBNP CBG: No results for input(s): GLUCAP in the last 168 hours. D-Dimer No results for input(s): DDIMER in the last 72 hours. Hgb A1c No results for input(s): HGBA1C in the last 72 hours. Lipid Profile No results for input(s): CHOL, HDL, LDLCALC, TRIG, CHOLHDL, LDLDIRECT in the last 72 hours. Thyroid function studies No results for input(s): TSH, T4TOTAL, T3FREE, THYROIDAB in the last 72 hours.  Invalid input(s): FREET3 Anemia work up No results for input(s): VITAMINB12, FOLATE, FERRITIN, TIBC, IRON, RETICCTPCT in the last 72 hours. Urinalysis    Component Value Date/Time   COLORURINE YELLOW (A) 02/26/2021 1024   APPEARANCEUR CLOUDY (A) 02/26/2021 1024   APPEARANCEUR Clear 02/26/2014 1126   LABSPEC 1.015 02/26/2021 1024   LABSPEC 1.008 02/26/2014 1126   PHURINE 5.0 02/26/2021 1024   GLUCOSEU 150 (A) 02/26/2021 1024   GLUCOSEU Negative 02/26/2014 1126   HGBUR SMALL (A) 02/26/2021 1024   BILIRUBINUR NEGATIVE 02/26/2021 1024   BILIRUBINUR Negative 02/26/2014 1126   KETONESUR NEGATIVE 02/26/2021 1024   PROTEINUR >=300 (A) 02/26/2021 1024   NITRITE NEGATIVE 02/26/2021 1024   LEUKOCYTESUR LARGE (A) 02/26/2021 1024   LEUKOCYTESUR 1+ 02/26/2014 1126   Sepsis Labs Invalid input(s): PROCALCITONIN,  WBC,  LACTICIDVEN Microbiology Recent Results (from the past 240 hour(s))  Resp Panel by RT-PCR (Flu A&B, Covid) Nasopharyngeal Swab     Status: None   Collection Time: 03/01/21 10:47 AM   Specimen: Nasopharyngeal Swab; Nasopharyngeal(NP) swabs in vial transport medium  Result Value Ref Range Status   SARS Coronavirus 2 by RT PCR NEGATIVE NEGATIVE Final    Comment: (NOTE) SARS-CoV-2 target nucleic acids are NOT DETECTED.  The SARS-CoV-2 RNA is generally detectable in upper respiratory specimens during the acute phase of infection. The lowest concentration of SARS-CoV-2 viral copies this assay can  detect is 138 copies/mL. A negative result does not preclude SARS-Cov-2 infection and should not be used as the sole basis for treatment or other patient management decisions. A negative result may occur with  improper specimen collection/handling, submission of specimen other than nasopharyngeal swab, presence of viral mutation(s) within the areas targeted by this assay, and inadequate number of viral copies(<138 copies/mL). A negative result must be combined with clinical observations, patient history, and epidemiological information. The expected result is Negative.  Fact Sheet for Patients:  EntrepreneurPulse.com.au  Fact Sheet for Healthcare Providers:  IncredibleEmployment.be  This test is no t yet approved or cleared by the [place] FDA and  has been authorized for detection and/or diagnosis of SARS-CoV-2 by FDA under an Emergency Use Authorization (EUA). This EUA will remain  in effect (meaning this test can be used) for the duration of the COVID-19 declaration under Section 564(b)(1) of the Act, 21 U.S.C.section 360bbb-3(b)(1), unless the authorization is terminated  or revoked sooner.       Influenza A by PCR NEGATIVE NEGATIVE Final   Influenza B by PCR NEGATIVE NEGATIVE Final    Comment: (NOTE) The Xpert Xpress SARS-CoV-2/FLU/RSV plus assay is intended as an aid in the diagnosis of influenza from Nasopharyngeal swab specimens and should not be used as a sole basis for treatment. Nasal washings and aspirates are unacceptable for Xpert Xpress SARS-CoV-2/FLU/RSV testing.  Fact Sheet for Patients: EntrepreneurPulse.com.au  Fact Sheet for Healthcare Providers: IncredibleEmployment.be  This test is not yet approved or cleared by the [place] FDA and has been authorized for detection and/or diagnosis of SARS-CoV-2 by FDA under an Emergency Use Authorization (EUA). This EUA will remain in  effect (meaning  this test can be used) for the duration of the COVID-19 declaration under Section 564(b)(1) of the Act, 21 U.S.C. section 360bbb-3(b)(1), unless the authorization is terminated or revoked.  Performed at [facility], [address] [place], [place] [address]      Time coordinating discharge: Over 30 minutes  SIGNED:   Wyvonnia Dusky, MD  [facility] 03/06/2021, 12:07 PM Pager   If 7PM-7AM, please contact night-coverage

## 2021-03-06 NOTE — Progress Notes (Signed)
°   03/03/21 2353  Assess: MEWS Score  Temp 98.2 F (36.8 C)  BP 98/79  Pulse Rate (!) 115  ECG Heart Rate (!) 115  Resp 18  Level of Consciousness Alert  SpO2 98 %  O2 Device CPAP  Assess: MEWS Score  MEWS Temp 0  MEWS Systolic 1  MEWS Pulse 2  MEWS RR 0  MEWS LOC 0  MEWS Score 3  MEWS Score Color Yellow  Assess: if the MEWS score is Yellow or Red  Were vital signs taken at a resting state? Yes  Focused Assessment No change from prior assessment  Does the patient meet 2 or more of the SIRS criteria? No  MEWS guidelines implemented *See Row Information* Yes  Treat  MEWS Interventions Other (Comment) (monitor)  Pain Scale 0-10  Pain Score 2  Pain Type Chronic pain  Pain Location Foot  Pain Orientation Left  Pain Intervention(s) Elevated extremity  Take Vital Signs  Increase Vital Sign Frequency  Yellow: Q 2hr X 2 then Q 4hr X 2, if remains yellow, continue Q 4hrs  Escalate  MEWS: Escalate Yellow: discuss with charge nurse/RN and consider discussing with provider and RRT  Notify: Charge Nurse/RN  Name of Charge Nurse/RN Notified Ria Comment RN  Date Charge Nurse/RN Notified 03/03/21  Time Charge Nurse/RN Notified [id number]  Assess: SIRS CRITERIA  SIRS Temperature  0  SIRS Pulse 1  SIRS Respirations  0  SIRS WBC 0  SIRS Score Sum  1

## 2021-03-06 NOTE — Progress Notes (Signed)
Progress Note  Patient Name: Hector [name] Date of Encounter: 03/06/2021  [facility] Cardiologist: Ida Rogue, MD   Subjective   Underwent TEE cardioversion yesterday for atrial flutter Normal sinus rhythm restored Telemetry reviewed, normal sinus rhythm this morning  Reports he feels well today, hoping to go home Denies leg swelling, no abdominal distention This admission treated with diltiazem bolus, infusion, IV Lasix On metoprolol, Eliquis  Inpatient Medications    Scheduled Meds:  apixaban  5 mg Oral BID   atorvastatin  80 mg Oral QHS   furosemide  60 mg Oral Daily   gabapentin  300 mg Oral BID   levothyroxine  100 mcg Oral Q0600   mouth rinse  15 mL Mouth Rinse BID   metoprolol tartrate  12.5 mg Oral Q6H   sodium chloride flush  3 mL Intravenous Q12H    Continuous Infusions:  sodium chloride      PRN Meds: acetaminophen **OR** acetaminophen, Muscle Rub     Vital Signs    Vitals:   03/06/21 0518 03/06/21 0824 03/06/21 1000 03/06/21 1228  BP: 100/81 91/75 90/78  121/84  Pulse: 69 61 71 69  Resp: 18 20  18   Temp: 98.9 F (37.2 C) 98 F (36.7 C)  98.4 F (36.9 C)  TempSrc: Oral Oral  Oral  SpO2: 98% 95% 93% 100%  Weight:      Height:        Intake/Output Summary (Last 24 hours) at 03/06/2021 1820 Last data filed at 03/06/2021 1000 Gross per 24 hour  Intake 480 ml  Output 500 ml  Net -20 ml   Last 3 Weights 03/05/2021 03/05/2021 03/04/2021  Weight (lbs) 256 lb 256 lb 9.6 oz 254 lb 15.7 oz  Weight (kg) 116.121 kg 116.393 kg 115.658 kg      Telemetry    Normal sinus rhythm- Personally Reviewed  ECG     - Personally Reviewed  Physical Exam   GEN: No acute distress.   Neck: No JVD Cardiac: RRR, no murmurs, rubs, or gallops.  Respiratory: Clear to auscultation bilaterally. GI: Soft, nontender, non-distended  MS: No edema; No deformity. Neuro:  Nonfocal  Psych: Normal affect   Labs    High Sensitivity Troponin:   Recent  Labs  Lab 02/28/21 1951 02/28/21 2203  TROPONINIHS 46* 54*     Chemistry Recent Labs  Lab 02/28/21 1951 03/02/21 2500 03/04/21 0727 03/05/21 0056 03/06/21 0944  NA 136   < > 135 134* 134*  K 4.3   < > 4.1 4.3 4.9  CL 108   < > 98 99 101  CO2 18*   < > 24 26 23   GLUCOSE 93   < > 110* 178* 201*  BUN 48*   < > 52* 60* 67*  CREATININE 3.15*   < > 3.38* 3.68* 3.40*  CALCIUM 8.7*   < > 8.7* 8.5* 8.8*  PROT 6.9  --   --   --   --   ALBUMIN 3.7  --   --   --   --   AST 12*  --   --   --   --   ALT 10  --   --   --   --   ALKPHOS 76  --   --   --   --   BILITOT 2.1*  --   --   --   --   GFRNONAA 22*   < > 20* 18* 20*  ANIONGAP 10   < >  13 9 10    < > = values in this interval not displayed.    Lipids No results for input(s): CHOL, TRIG, HDL, LABVLDL, LDLCALC, CHOLHDL in the last 168 hours.  Hematology Recent Labs  Lab 02/28/21 1951 03/03/21 0459 03/06/21 0944  WBC 7.8 9.4 8.0  RBC 5.56 5.75 5.39  HGB 13.7 14.3 13.4  HCT 46.4 46.8 43.7  MCV 83.5 81.4 81.1  MCH 24.6* 24.9* 24.9*  MCHC 29.5* 30.6 30.7  RDW 20.8* 20.4* 20.3*  PLT 251 244 246   Thyroid No results for input(s): TSH, FREET4 in the last 168 hours.  BNP Recent Labs  Lab 02/28/21 1951  BNP 1,449.1*    DDimer No results for input(s): DDIMER in the last 168 hours.   Radiology    ECHO TEE  Result Date: 03/05/2021    TRANSESOPHOGEAL ECHO REPORT   Patient Name:   Hector [name] Date of Exam: 03/05/2021 Medical Rec #:  [id number]           Height:       71.0 in Accession #:    [id number]          Weight:       256.0 lb Date of Birth:  03-25-1965           BSA:          2.342 m Patient Age:    56 years            BP:           111/92 mmHg Patient Gender: M                   HR:           116 bpm. Exam Location:  [facility] Procedure: Transesophageal Echo, Color Doppler, Cardiac Doppler and Saline            Contrast Bubble Study Indications:     Atrial Flutter I48.92  History:         Patient has prior history of  Echocardiogram examinations, most                  recent 10/29/2020. Risk Factors:Dyslipidemia. Alcoholic                  cardiomyopathy, Paroxysmal A-fib.  Sonographer:     Sherrie Sport Referring Phys:  [id number] [place] Diagnosing Phys: Kate Sable MD PROCEDURE: The transesophogeal probe was passed without difficulty through the esophogus of the patient. Sedation performed by different physician. The patient developed no complications during the procedure. A successful direct current cardioversion was  performed at 150 joules with 1 attempt. IMPRESSIONS  1. Left ventricular ejection fraction, by estimation, is <20%. The left ventricle has severely decreased function. The left ventricular internal cavity size was mildly dilated.  2. Right ventricular systolic function is severely reduced. The right ventricular size is normal.  3. Left atrial size was mildly dilated. No left atrial/left atrial appendage thrombus was detected.  4. The mitral valve is normal in structure. Mild to moderate mitral valve regurgitation.  5. The aortic valve is tricuspid. Aortic valve regurgitation is not visualized.  6. Agitated saline contrast bubble study was negative, with no evidence of any interatrial shunt. FINDINGS  Left Ventricle: Left ventricular ejection fraction, by estimation, is <20%. The left ventricle has severely decreased function. The left ventricular internal cavity size was mildly dilated. Right Ventricle: The right ventricular size is normal. No increase in right ventricular  wall thickness. Right ventricular systolic function is severely reduced. Left Atrium: Left atrial size was mildly dilated. Spontaneous echo contrast was present. No left atrial/left atrial appendage thrombus was detected. Right Atrium: Right atrial size was normal in size. Pericardium: There is no evidence of pericardial effusion. Mitral Valve: The mitral valve is normal in structure. Mild to moderate mitral valve regurgitation. Tricuspid  Valve: The tricuspid valve is normal in structure. Tricuspid valve regurgitation is mild. Aortic Valve: The aortic valve is tricuspid. Aortic valve regurgitation is not visualized. Pulmonic Valve: The pulmonic valve was not well visualized. Pulmonic valve regurgitation is not visualized. Aorta: The aortic root is normal in size and structure. IAS/Shunts: No atrial level shunt detected by color flow Doppler. Agitated saline contrast was given intravenously to evaluate for intracardiac shunting. Agitated saline contrast bubble study was negative, with no evidence of any interatrial shunt. Kate Sable MD Electronically signed by Kate Sable MD Signature Date/Time: 03/05/2021/6:21:33 PM    Final     Cardiac Studies   Echocardiogram  1. Left ventricular ejection fraction, by estimation, is <20%. The left  ventricle has severely decreased function. The left ventricular internal  cavity size was mildly dilated.   2. Right ventricular systolic function is severely reduced. The right  ventricular size is normal.   3. Left atrial size was mildly dilated. No left atrial/left atrial  appendage thrombus was detected.   4. The mitral valve is normal in structure. Mild to moderate mitral valve  regurgitation.   5. The aortic valve is tricuspid. Aortic valve regurgitation is not  visualized.   6. Agitated saline contrast bubble study was negative, with no evidence  of any interatrial shunt.    Patient Profile     56 y.o. 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   Assessment & Plan    Atrial flutter with RVR TEE cardioversion yesterday, normal sinus rhythm restored Off amiodarone secondary to thyroid disease Not a good candidate for digoxin  or Tikosyn in the setting of renal dysfunction For now continue beta-blockers, Eliquis Consider follow-up with EP, Poor candidate for ablation given body habitus BMI over 35  Acute on chronic systolic CHF, nonischemic cardiomyopathy Lasix on hold in the setting of renal dysfunction Not on ACE, ARB, Entresto secondary to acute on chronic renal failure  Acute on chronic renal failure In the setting of diabetes type 2 Creatinine 3.4 BUN 67 Needs follow-up with nephrology  Discussed with hospitalist service, discharge instructions discussed with patient  Total encounter time more than 35 minutes  Greater than 50% was spent in counseling and coordination of care with the patient   For questions or updates, please contact [place] Please consult [email or web address] for contact info under        Signed, Ida Rogue, MD  03/06/2021, 6:20 PM

## 2021-03-06 NOTE — Plan of Care (Signed)
DISCHARGE NOTE HOME SIR MALLIS to be discharged home per MD order. Discussed prescriptions and follow up appointments with the patient. Medication list explained in detail. Patient verbalized understanding.  Skin clean, dry and intact without evidence of skin break down, no evidence of skin tears noted. IV catheter discontinued intact. Site without signs and symptoms of complications. Dressing and pressure applied. Pt denies pain at the site currently. No complaints noted.  Patient free of lines, drains, and wounds.   An After Visit Summary (AVS) was printed and given to the patient. Patient to be escorted via wheelchair, and discharged home via private auto.  Stephan Minister, RN

## 2021-03-10 ENCOUNTER — Ambulatory Visit: Payer: Medicaid Other

## 2021-03-10 ENCOUNTER — Ambulatory Visit
Admission: RE | Admit: 2021-03-10 | Discharge: 2021-03-10 | Disposition: A | Discharge status: Home / Self Care | Payer: Medicaid Other | Source: Ambulatory Visit | Attending: Neurology | Admitting: Neurology

## 2021-03-10 DIAGNOSIS — R200 Anesthesia of skin: Secondary | ICD-10-CM

## 2021-03-10 IMAGING — MR MR CERVICAL SPINE WO/W CM
5 of 8 series · 27 of 48 positions shown · IV contrast (10ml Gadavist)
Comparison: MRI of the cervical spine [DATE].

CLINICAL DATA: Provided history: Arm numbness. Additional history
provided by scanning technologist: Patient reports left arm numbness
for 14 months, bilateral leg numbness.

EXAM:
MRI CERVICAL SPINE WITHOUT AND WITH CONTRAST
TECHNIQUE: Multiplanar and multiecho pulse sequences of the cervical spine, to
include the craniocervical junction and cervicothoracic junction,
were obtained without and with intravenous contrast.
CONTRAST:  10mL GADAVIST GADOBUTROL 1 MMOL/ML IV SOLN

[Series 22: T2 · sagittal · 3.0mm · 0.62mm/px · 4 of 15 slices shown (1 of 2)]
[im 1/15]
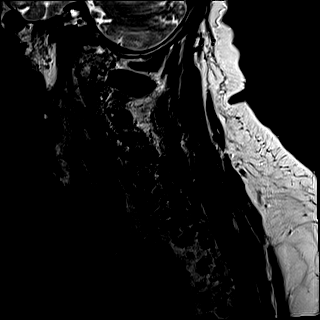
[im 5/15]
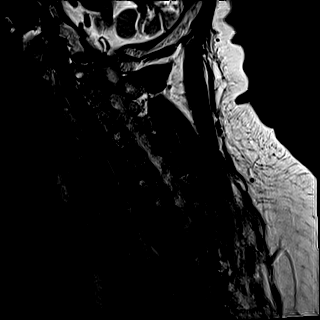
[im 10/15]
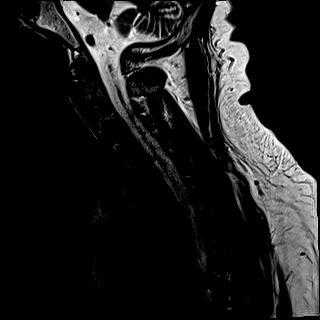
[im 15/15]
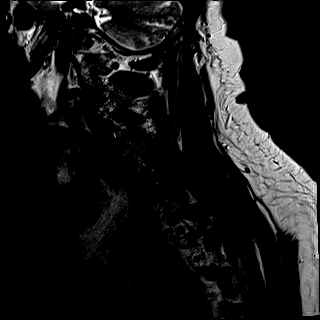

[Series 24: STIR · sagittal · 3.0mm · 0.62mm/px · 4 of 15 slices shown]
[im 1/15]
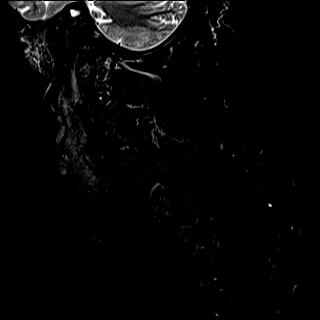
[im 5/15]
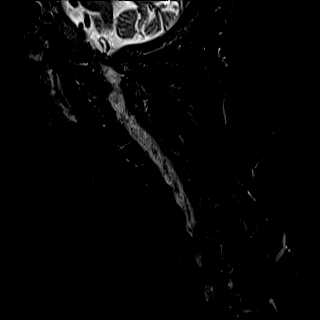
[im 10/15]
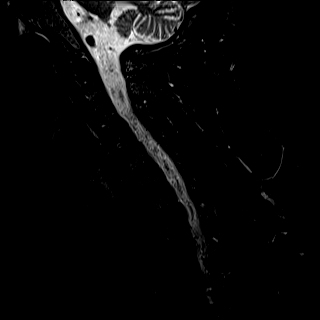
[im 15/15]
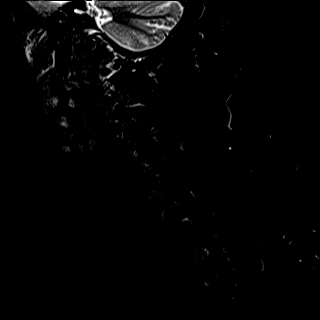

[Series 25: T2 · axial · 3.0mm · 0.70mm/px · z∈[-233,-138]mm · 8 of 31 slices shown (2 of 2)]
[im 1/31]
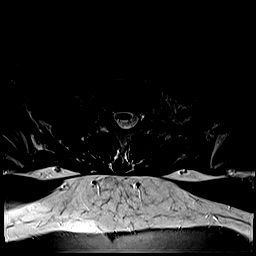
[im 5/31]
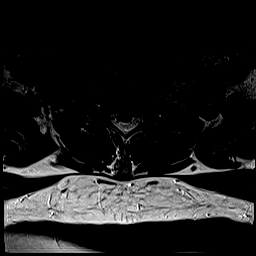
[im 9/31]
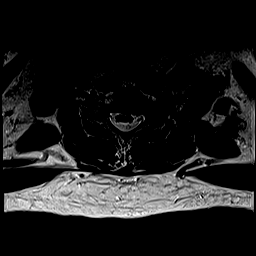
[im 13/31]
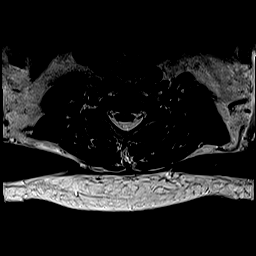
[im 18/31]
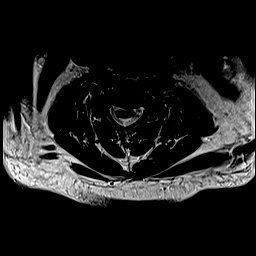
[im 22/31]
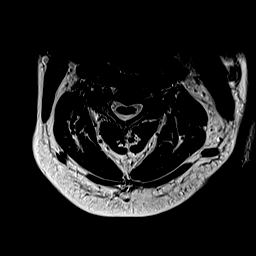
[im 26/31]
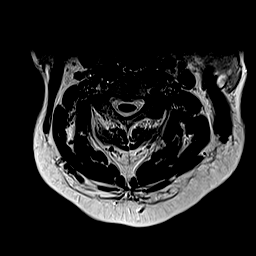
[im 31/31]
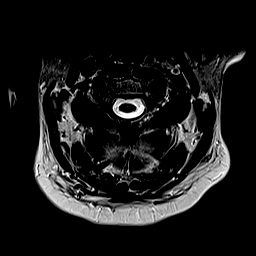

[Series 27: T1 · axial · non-contrast · 3.0mm · 0.35mm/px · z∈[-233,-138]mm · 8 of 31 slices shown]
[im 1/31]
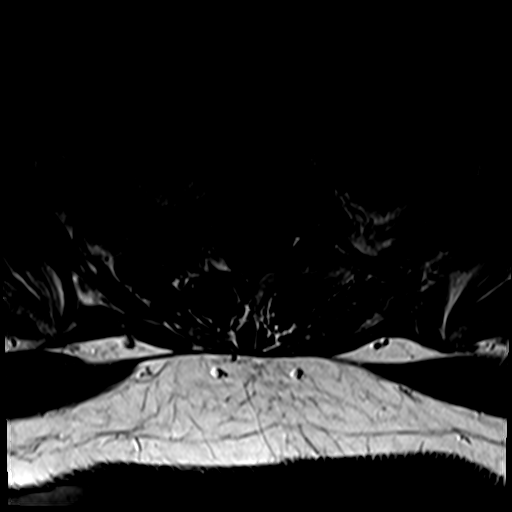
[im 5/31]
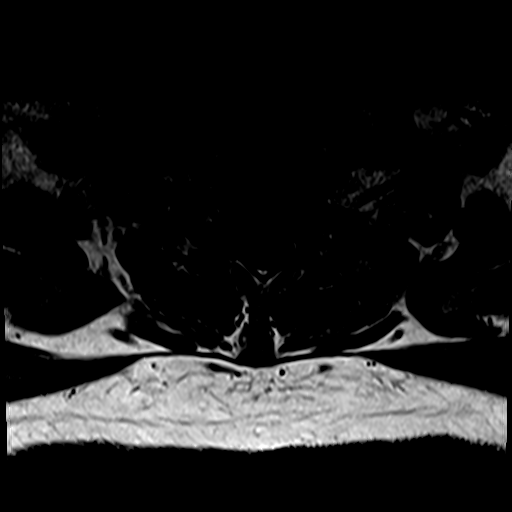
[im 9/31]
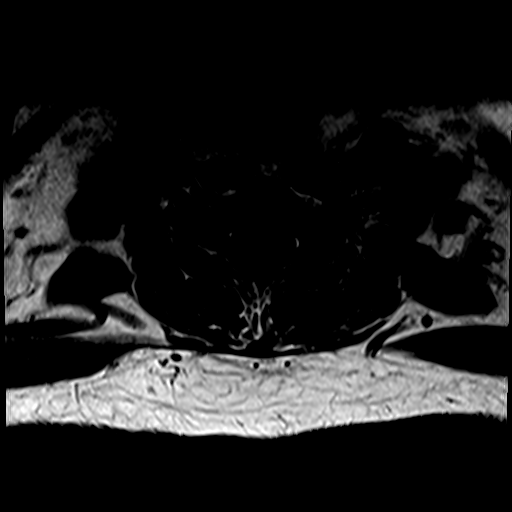
[im 13/31]
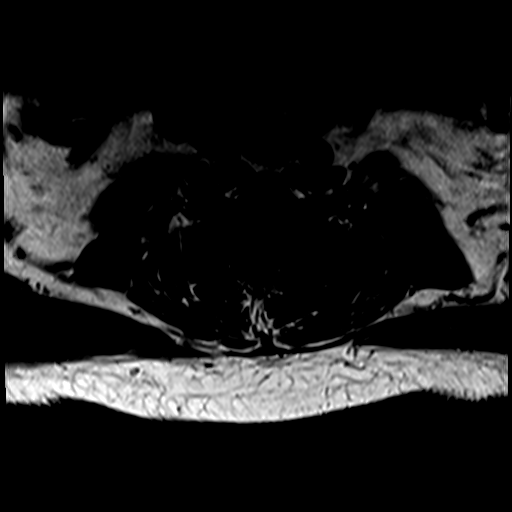
[im 18/31]
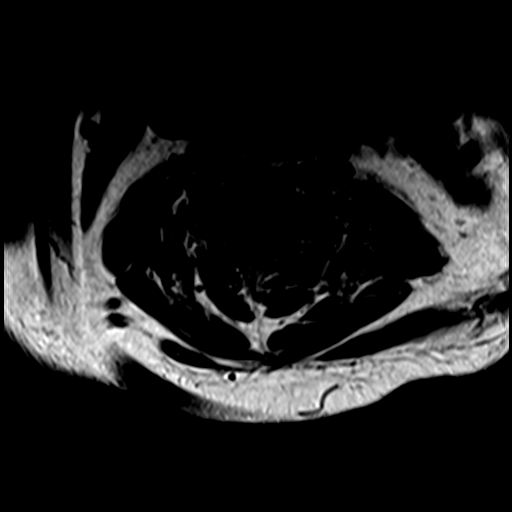
[im 22/31]
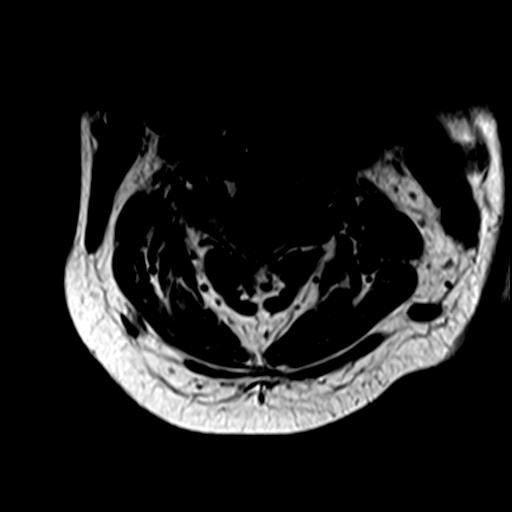
[im 26/31]
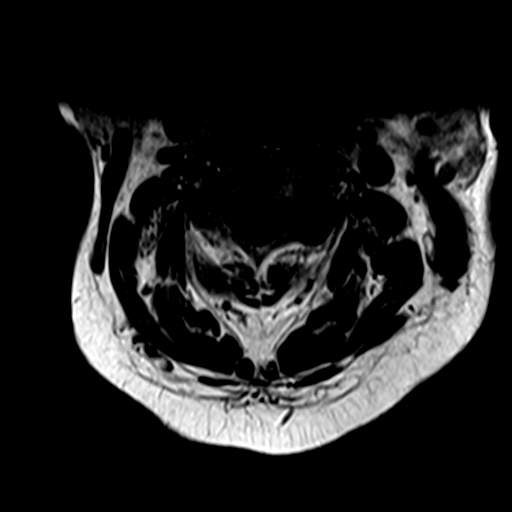
[im 31/31]
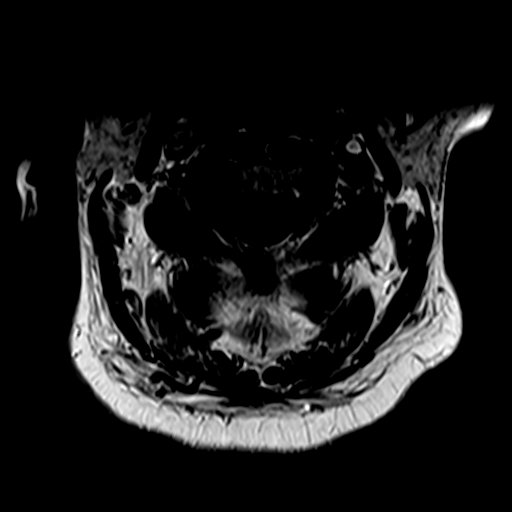

[Series 31: T1 post-contrast · axial · 3.0mm · 0.35mm/px · z∈[-233,-208]mm · 3 of 31 slices shown]
[im 1/31]
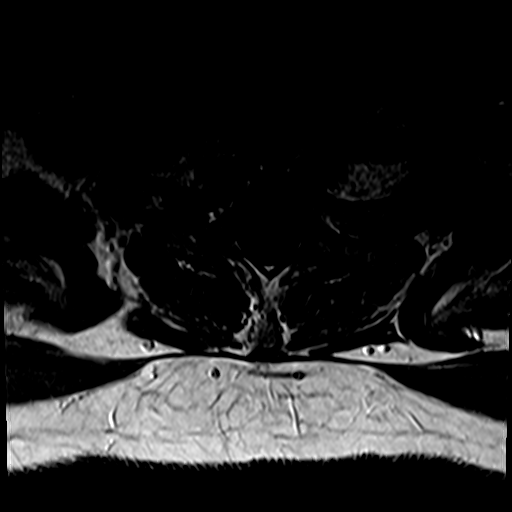
[im 5/31]
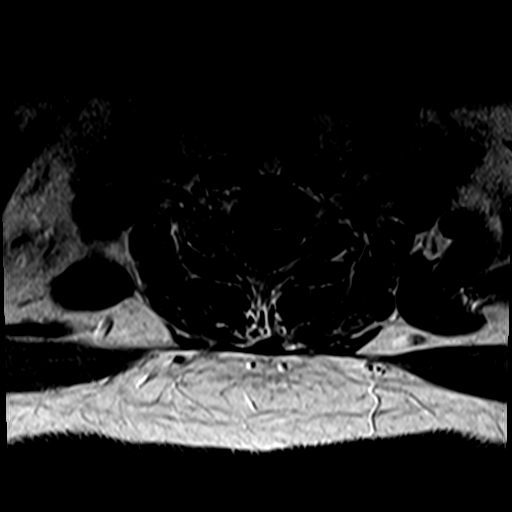
[im 9/31]
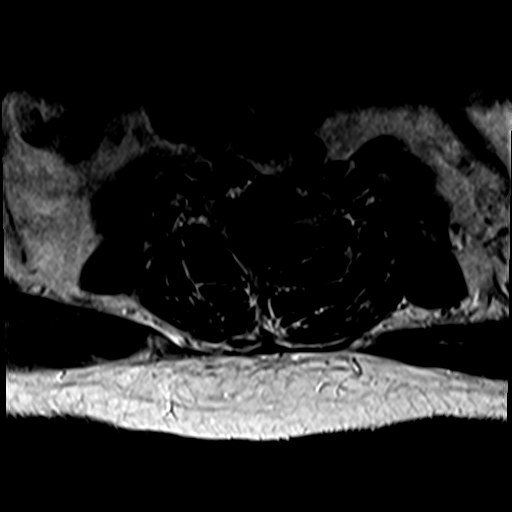

[27 of 48 positions shown; findings below may reference images not displayed]

FINDINGS: Intermittently motion degraded examination, limiting evaluation.
Most notably, there is moderate motion degradation of the axial T2
GRE sequence.

Alignment: Reversal of the expected cervical lordosis. Trace C5-C6
grade 1 anterolisthesis.

Vertebrae: Vertebral body height is maintained. No significant
marrow edema or focal suspicious osseous lesion.

Cord: No appreciable signal abnormality within the cervical spinal
cord on the current exam. The small foci of T2 hyperintense signal
abnormality within the right aspect of the spinal cord at C2 and C3
described on the prior cervical spine MRI of [DATE] [DATE] be
obscured by motion on the current exam or may have been artifactual
on the prior exam. No abnormal enhancement of the cervical spinal
cord.

Posterior Fossa, vertebral arteries, paraspinal tissues: Posterior
fossa better assessed on same day brain MRI. Flow voids preserved
within the imaged cervical vertebral arteries. Paraspinal soft
tissues unremarkable.

Disc levels:

Unless otherwise stated, the level by level findings below have not
significantly changed from the prior MRI of [DATE].

No more than mild disc degeneration within the cervical or
visualized upper thoracic spine.

C2-C3: Tiny central disc protrusion. Suspected facet joint ankylosis
on the left. No significant spinal canal or foraminal stenosis.

C3-C4: Minimal uncovertebral hypertrophy on the left. Facet
arthrosis (greater on the left). No significant disc herniation or
spinal canal stenosis. Mild relative left neural foraminal
narrowing.

C4-C5: Tiny left foraminal disc protrusion. Facet arthrosis. The
disc protrusion results in mild focal effacement of ventral thecal
sac with possible contact upon the ventral spinal cord. No
significant foraminal stenosis.

C5-C6: Slight disc bulge. Facet arthrosis. The disc bulge minimally
effaces the ventral thecal sac and may contact the ventral spinal
cord. No significant foraminal stenosis.

C6-C7: No significant disc herniation or stenosis.

C7-T1: Shallow broad-based left center disc protrusion. The disc
protrusion results in mild focal effacement of the ventral thecal
sac with possible contact upon the ventral spinal cord. No
significant foraminal stenosis.

T1-T2: Incompletely imaged in the axial plane. No appreciable
significant disc herniation, spinal canal stenosis or neural
foraminal narrowing.
IMPRESSION: Intermittently motion degraded examination.

No appreciable signal abnormality within the cervical spinal cord on
the current exam. The apparent small foci of T2 hyperintense signal
abnormality within the right aspect of the spinal cord at C2 and C3
on the prior MRI of [DATE] [DATE] be obscured by motion on the
current exam, or may have reflected artifact on the prior study. No
abnormal enhancement of the cervical spinal cord.

Cervical spondylosis, as outlined and not significantly changed from
the prior MRI. No more than mild relative spinal canal narrowing. As
before, disc protrusions/disc bulges mildly efface the ventral
thecal sac and may contact the ventral spinal cord at C4-C5, C5-C6
and C7-T1. Mild multifactorial left neural from narrowing at C3-C4.

No more than mild degeneration within the cervical spine.

## 2021-03-10 IMAGING — MR MR HEAD WO/W CM
13 series · 46 of 48 positions shown · IV contrast (gadavist)
Comparison: Brain MRI [DATE]. maxillofacial CT [DATE]

CLINICAL DATA: Provided history: Arm numbness. Additional history
provided by scanning technologist: Patient reports left arm numbness
for 14 months, bilateral leg numbness.

EXAM:
MRI HEAD WITHOUT AND WITH CONTRAST
TECHNIQUE: Multiplanar, multiecho pulse sequences of the brain and surrounding
structures were obtained without and with intravenous contrast.
CONTRAST:  10mL GADAVIST GADOBUTROL 1 MMOL/ML IV SOLN

[Series 5: ax dwi_tracew · axial · 3.0mm · 0.65mm/px · z∈[-109,+45]mm · 4 of 48 slices shown]
[im 1/48]
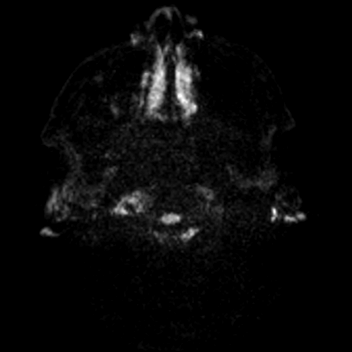
[im 16/48]
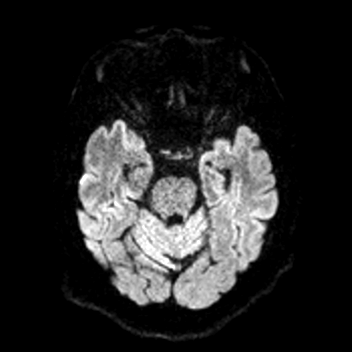
[im 32/48]
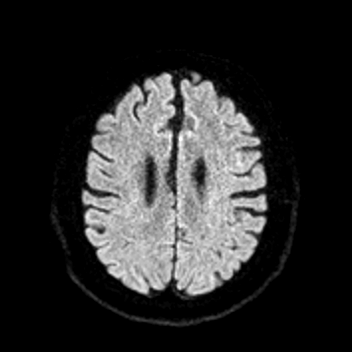
[im 48/48]
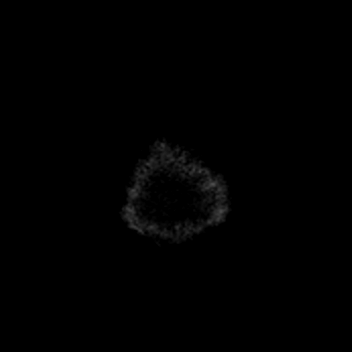

[Series 6: ax dwi_adc · axial · 3.0mm · 0.65mm/px · z∈[-109,+45]mm · 3 of 48 slices shown]
[im 1/48]
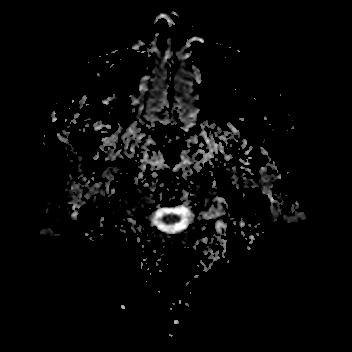
[im 24/48]
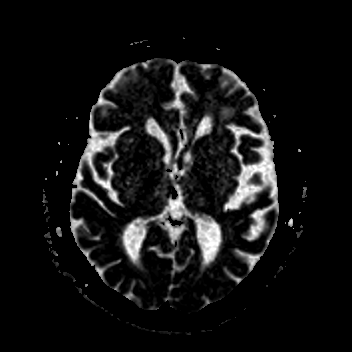
[im 48/48]
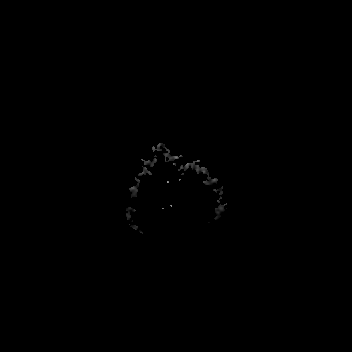

[Series 7: cor dwi_tracew · coronal · 5.0mm · 0.65mm/px · 2 of 38 slices shown]
[im 1/38]
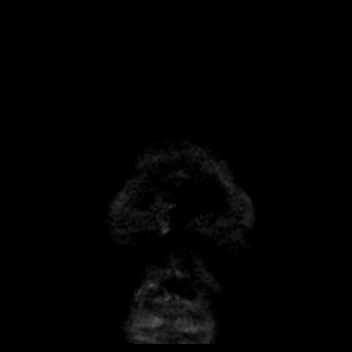
[im 38/38]
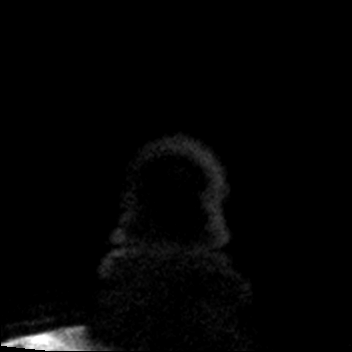

[Series 8: cor dwi_adc · coronal · 5.0mm · 0.65mm/px · 2 of 38 slices shown]
[im 1/38]
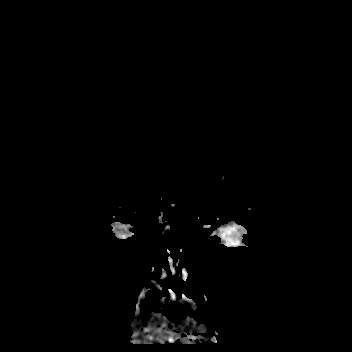
[im 38/38]
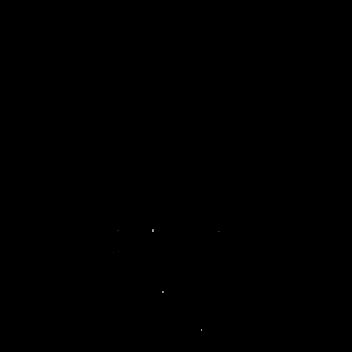

[Series 9: T1 · sagittal · 5.0mm · 0.62mm/px · 1 of 24 slices shown (1 of 2)]
[im 1/24]
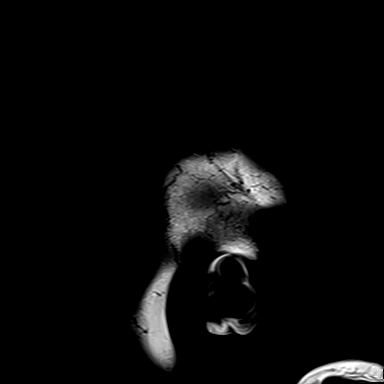

[Series 10: T2 · axial · 5.0mm · 0.53mm/px · 1 of 25 slices shown (1 of 2)]
[im 1/25]
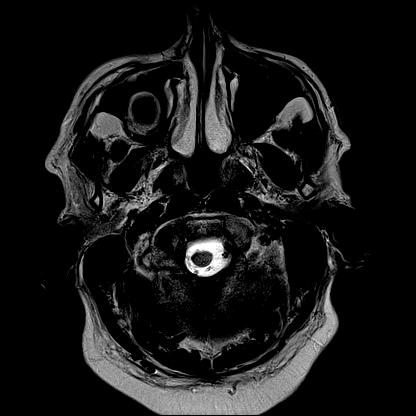

[Series 12: pha_images · axial · 3.0mm · 0.90mm/px · z∈[-117,+53]mm · 3 of 58 slices shown]
[im 1/58]
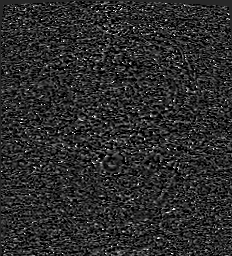
[im 29/58]
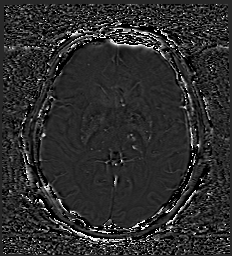
[im 58/58]
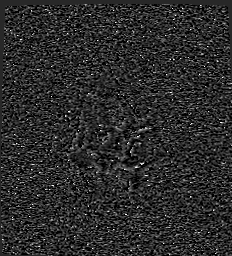

[Series 13: swi_images · axial · 3.0mm · 0.90mm/px · z∈[-117,+59]mm · 4 of 60 slices shown]
[im 1/60]
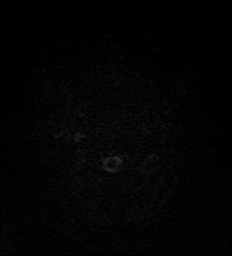
[im 20/60]
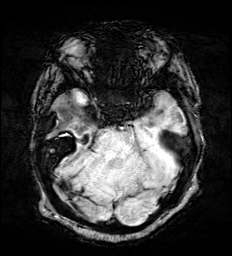
[im 40/60]
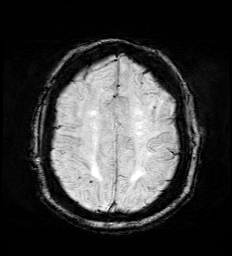
[im 60/60]
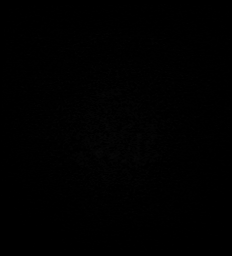

[Series 15: FLAIR · axial · 3.0mm · 0.53mm/px · z∈[-110,+51]mm · 3 of 55 slices shown]
[im 1/55]
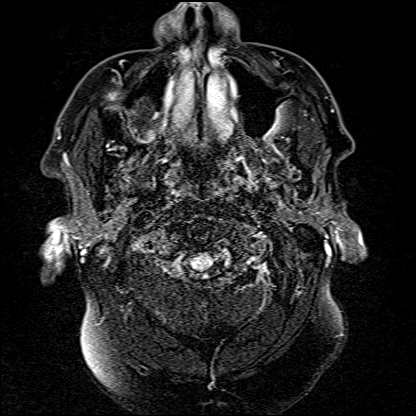
[im 28/55]
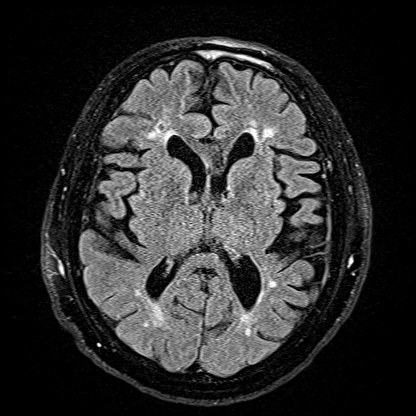
[im 55/55]
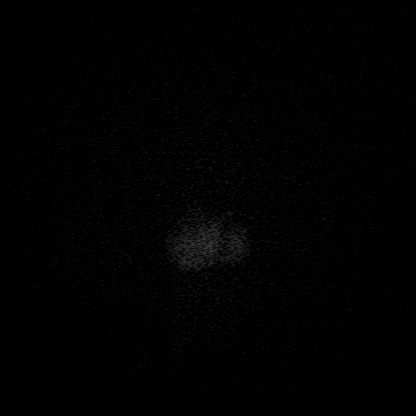

[Series 16: T1 · axial · 1.0mm · 0.98mm/px · z∈[-115,+60]mm · 8 of 175 slices shown (2 of 2)]
[im 1/175]
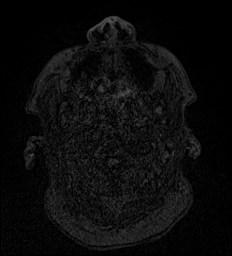
[im 20/175]
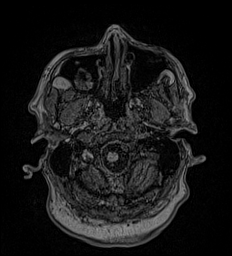
[im 59/175]
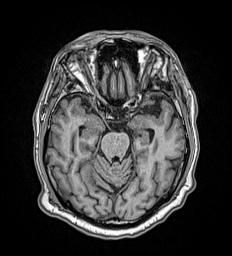
[im 78/175]
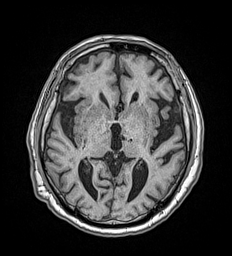
[im 97/175]
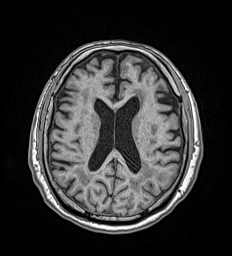
[im 117/175]
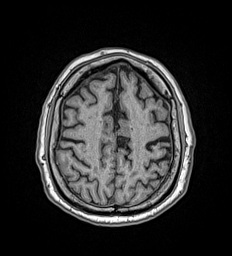
[im 155/175]
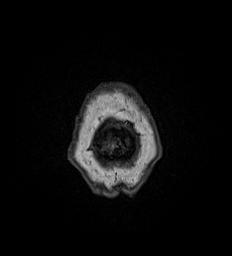
[im 175/175]
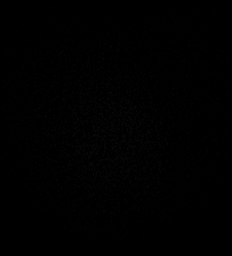

[Series 17: T2 · coronal · 5.0mm · 0.57mm/px · 2 of 29 slices shown (2 of 2)]
[im 1/29]
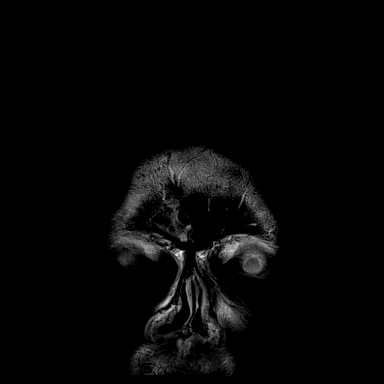
[im 29/29]
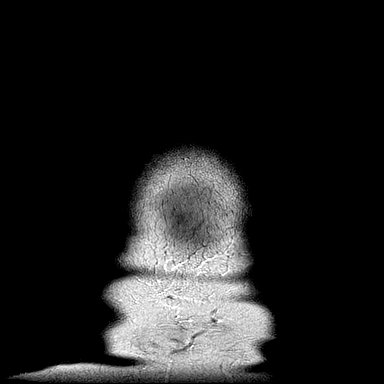

[Series 28: T1 post-contrast · axial · 1.0mm · 0.98mm/px · z∈[-115,+60]mm · 11 of 176 slices shown (1 of 2)]
[im 1/176]
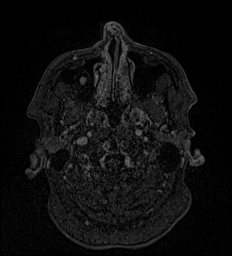
[im 18/176]
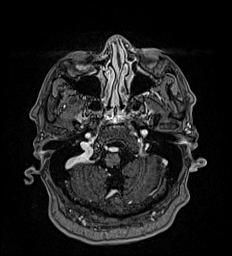
[im 36/176]
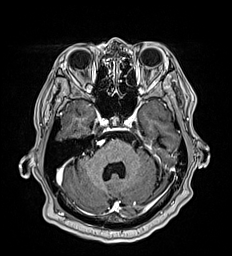
[im 53/176]
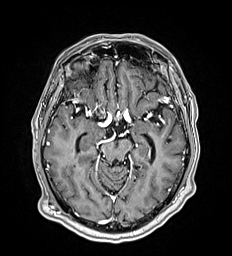
[im 71/176]
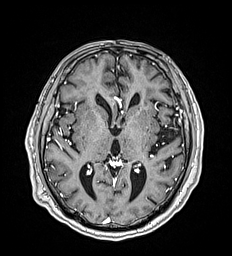
[im 88/176]
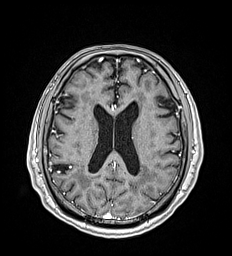
[im 106/176]
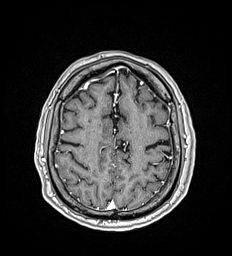
[im 123/176]
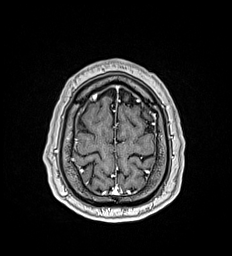
[im 141/176]
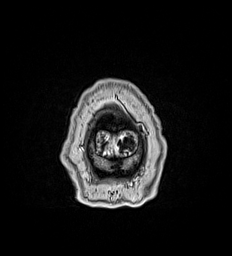
[im 158/176]
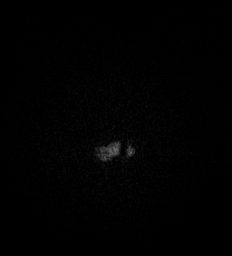
[im 176/176]
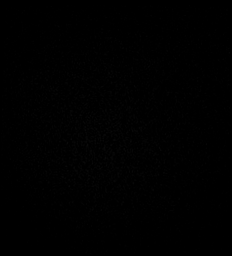

[Series 29: T1 post-contrast · coronal · 5.0mm · 0.57mm/px · 2 of 29 slices shown (2 of 2)]
[im 1/29]
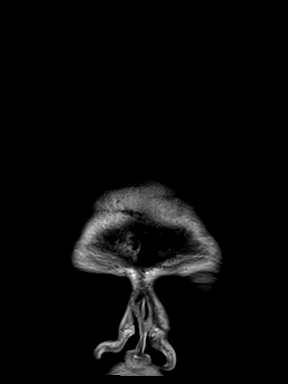
[im 29/29]
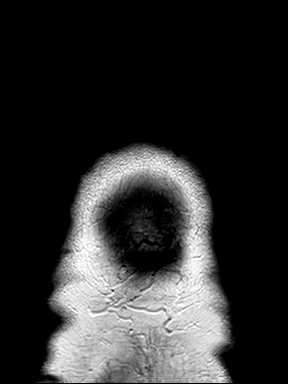

[46 of 48 positions shown; findings below may reference images not displayed]

FINDINGS: Brain:

Mild to moderate generalized cerebral atrophy. Comparatively mild
cerebellar atrophy.

There is a 8 mm focus of cortical T2 FLAIR hyperintense signal
abnormality and corresponding enhancement within the left parietal
lobe, new from the brain MRI of [DATE] (series 28, image 97)
(series 15, image 35).

Moderate/severe patchy and confluent T2 FLAIR hyperintense
abnormality within the white matter, nonspecific but most often
secondary to chronic small vessel ischemia.

Redemonstrated chronic lacunar infarcts within the left thalamus and
left cerebellar hemisphere.

As before, there are multiple supratentorial and infratentorial
chronic microhemorrhages with a deep gray nuclei predominance,
likely reflecting sequela of chronic hypertensive microangiopathy.

There is no acute infarct.

No chronic intracranial blood products.

No extra-axial fluid collection.

No midline shift.

Vascular: Maintained flow voids within the proximal large arterial
vessels.

Skull and upper cervical spine: No focal suspicious marrow lesion.

Sinuses/Orbits: Visualized orbits show no acute finding. 2.5 cm
osteoma within the right maxillary sinus, unchanged in size. Small
volume frothy secretions and background mild mucosal thickening
within the right frontal sinus. Mild mucosal thickening within the
bilateral ethmoid sinuses.

Impression #1 will be called to the ordering clinician or
representative by the Radiologist Assistant, and communication
documented in the PACS or [REDACTED].
IMPRESSION: 8 mm focus of cortical T2 FLAIR hyperintense signal abnormality and
enhancement within the left parietal lobe, a new finding as compared
to the prior MRI of [DATE]. This may reflect a late subacute
infarct. However, a 6 week follow-up brain MRI with contrast is
recommended to monitor this finding and to exclude alternative
etiologies (such as a cortical metastasis).

Moderate/advanced patchy and confluent T2 FLAIR hyperintense signal
abnormality within the cerebral white matter, nonspecific but most
often secondary to chronic small vessel ischemia. These findings
have not significant changed from the prior MRI.

Redemonstrated chronic lacunar infarcts within the left thalamus and
left cerebellar hemisphere.

Redemonstration of numerous supratentorial and infratentorial
chronic microhemorrhages in a distribution suggesting chronic
hypertensive microangiopathy.

Age-advanced cerebral and cerebellar atrophy.

Paranasal sinus disease, as described.

## 2021-03-10 MED ORDER — GADOBUTROL 1 MMOL/ML IV SOLN
10.0000 mL | Freq: Once | INTRAVENOUS | Status: AC | PRN
  Administered 2021-03-10: 10 mL via INTRAVENOUS

## 2021-03-12 NOTE — Progress Notes (Signed)
[name], male    DOB: [birth year], 56 y.o.   MRN: [id number]  HPI  Mr Hector [name] is a 56 y/o male with a history of MI, hyperlipidemia, HTN, CKD, atrial fibrillation, alcohol use, cardiomyopathy, and CHF.   TEE report from 03/05/21 reviewed and showed an EF of <20% along with mild/moderate MR but no thrombus. Echo report from 09/13/20 reviewed showed an EF of 25-30% along with moderate MR. Echo done 12/03/16 showed an EF of 45-50% which is an improvement from previous echo which was done on 06/02/16 and showed an EF of 30-35%. Prior echo on 02/21/15 which showed a normalization of his EF which was 50-55% with mild MR. No longer ICD candidate. Prior echo on 03/07/14 showed an EF of 20-25% with mild/mod MR.   Last left heart cath was done 01/09/15.   Admitted 03/01/21 due to SOB and abdominal pain/ distention. Found to be in a flutter. Initially given IV lasix with transition to oral diuretics. EP & cardiology consults obtained. Successful cardioversion completed. Discharged after 5 days. Was in the ED 02/26/21 due to lower abdominal pain. Abdominal CT was negative. Admission offered but patient declined and he was released. Admitted 09/12/20 due to chest pain and shortness of breath. Initially given IV lasix with transition to oral diuretics. Cardiology consult obtained. HTN meds adjusted. Elevated troponin thought to be due to demand ischemia. Discharged after 4 days.   He presents today for a follow-up visit with a chief complaint of minimal fatigue upon moderate exertion. He describes this as chronic in nature having been present for several years. He has associated shortness of breath, pedal edema, left arm/hand numbness & chronic pain along with this. He denies any difficulty sleeping, dizziness, abdominal distention, palpitations, chest pain or cough.   Has scales at home but has to get batteries and he says that it takes the "little round batteries". Hasn't been taking a couple of  medications  because he said to wait until he was seen but he's not real clear on which ones he's not taking. Reports that he's watching his sodium intake but that he's been eating more.   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 12/2007 MV: EF 28%, no isch;  b. 8/12 Echo: EF 25-35%; c. 02/2014 Echo: EF 20-25%; d. 12/2014 Cath: minimal CAD; e. 01/2015 Echo: EF 50-55%;  d. 05/2016 Echo: EF 30-35%, diff HK, gr2 DD; e. 11/2016 Echo: EF 45-50%, diff HK.   Chronic combined systolic (congestive) and diastolic (congestive) heart failure ([place])    a. 05/2016 Most recent Echo: EF 30-35%, diff HK, Gr2 DD, mild MR, mod dil LA; b. 11/2016 Echo: EF 45-50%, diff HK.   CKD (chronic kidney disease), stage III (HCC)    Elevated troponin    Chronically elevated. - level was 0.17-0.10 during recent admission   Essential hypertension    GI bleed 11/2013   Hyperlipidemia    Pancreatitis    Paroxysmal A-fib (HCC)    a. new onset s/p unsuccessful TEE/DCCV on 08/16/2014; b. on amio/eliquis (CHA2DS2VASc = 2-3); c. reports 1-2 hrs of afib ~ q34mos.   Paroxysmal atrial flutter ([place])    a. new onset 07/2014; b. s/p unsuccessful TEE/DCCV 08/16/2014; c. on apixaban.   Sleep-disordered breathing    Has yet to have a sleep study   Stroke [facility])    Past Surgical History:  Procedure Laterality Date   CARDIAC CATHETERIZATION  N/A 01/09/2015   Procedure: Left Heart Cath and Coronary Angiography;  Surgeon: [name], MD;  Location: [facility];  Service: Cardiovascular;  Laterality: N/A;   CARDIOVERSION N/A 03/05/2021   Procedure: CARDIOVERSION;  Surgeon: Kate Sable, MD;  Location: [facility] ORS;  Service: Cardiovascular;  Laterality: N/A;   COLONOSCOPY N/A 07/22/2020   Procedure: COLONOSCOPY;  Surgeon: Toledo, Benay Pike, MD;  Location: [facility] ENDOSCOPY;  Service: Gastroenterology;  Laterality: N/A;   ELECTROPHYSIOLOGIC STUDY N/A 08/16/2014   Procedure:  CARDIOVERSION;  Surgeon: [name], MD;  Location: [facility]S;  Service: Cardiovascular;  Laterality: N/A;   FLEXIBLE SIGMOIDOSCOPY N/A 10/10/2015   Procedure: FLEXIBLE SIGMOIDOSCOPY;  Surgeon: [name], MD;  [facility];  Service: Endoscopy;  Laterality: N/A;   KNEE SURGERY Right    NM MYOVIEW LTD  November 2011   No ischemia or infarction. EF 50-55% ( no improvement from 2009 Myoview EF of 28%   TEE WITHOUT CARDIOVERSION N/A 08/16/2014   Procedure: TRANSESOPHAGEAL ECHOCARDIOGRAM (TEE);  Surgeon: [name], MD;  Location: [facility] ORS;  Service: Cardiovascular;  Laterality: N/A;   TEE WITHOUT CARDIOVERSION N/A 03/05/2021   Procedure: TRANSESOPHAGEAL ECHOCARDIOGRAM (TEE);  Surgeon: Kate Sable, MD;  Location: [facility] ORS;  Service: Cardiovascular;  Laterality: N/A;   Family History  Problem Relation Age of Onset   Hypertension Mother    Hyperlipidemia Mother    Diabetes Mother    Social History   Tobacco Use   Smoking status: Former    Packs/day: 1.00    Years: 12.00    Pack years: 12.00    Types: Cigarettes    Quit date: 02/23/1998    Years since quitting: 23.0   Smokeless tobacco: Never  Substance Use Topics   Alcohol use: Not Currently    Alcohol/week: 0.0 standard drinks    Comment: Past heavy drinker   Allergies  Allergen Reactions   Esomeprazole Magnesium Other (See Comments)    Suspected interstitial nephritis 2018   Eggs Or Egg-Derived Products Rash   Prior to Admission medications   Medication Sig Start Date End Date Taking? Authorizing Provider  apixaban (ELIQUIS) 5 MG TABS tablet Take 1 tablet (5 mg total) by mouth 2 (two) times daily. 10/08/20  Yes [name], MD  atorvastatin (LIPITOR) 80 MG tablet Take 1 tablet (80 mg total) by mouth daily. 10/08/20  Yes Visser, Jacquelyn D, PA-C  ferrous gluconate (FERGON) 324 MG tablet Take 324 mg by mouth daily with breakfast.   Yes [provider]  gabapentin (NEURONTIN) 300 MG  capsule Take 300 mg by mouth 2 (two) times daily. 01/23/21  Yes [provider]  levothyroxine (SYNTHROID) 100 MCG tablet Take 100 mcg by mouth daily before breakfast.   Yes [provider]  metoprolol succinate (TOPROL-XL) 25 MG 24 hr tablet Take 0.5 tablets (12.5 mg total) by mouth daily. 03/07/21 04/06/21 Yes Wyvonnia Dusky, MD  dapagliflozin propanediol (FARXIGA) 10 MG TABS tablet Take 1 tablet (10 mg total) by mouth daily before breakfast. Hold this medication until you see your cardiologist and/or nephrologist Patient not taking: Reported on 03/13/2021 03/06/21   Wyvonnia Dusky, MD  furosemide (LASIX) 40 MG tablet Take 1 tablet (40 mg total) by mouth daily. Hold this medication until you see your cardiologist and/or nephrologist Patient not taking: Reported on 03/13/2021 03/06/21 07/04/21  Wyvonnia Dusky, MD    Review of Systems  Constitutional:  Positive for fatigue. Negative for appetite change.  HENT:  Negative for congestion, postnasal drip and sore throat.   Eyes: Negative.   Respiratory:  Positive for shortness of breath. Negative for cough.   Cardiovascular:  Positive for leg swelling ("little bit"). Negative for chest pain and palpitations.  Gastrointestinal:  Negative for abdominal distention and abdominal pain.  Endocrine: Negative.   Genitourinary: Negative.   Musculoskeletal:  Positive for arthralgias (left hand) and back pain. Negative for neck pain.  Skin: Negative.   Allergic/Immunologic: Negative.   Neurological:  Positive for numbness (down left arm (pinched nerve)). Negative for dizziness, weakness and light-headedness.  Hematological:  Negative for adenopathy. Does not bruise/bleed easily.  Psychiatric/Behavioral:  Negative for dysphoric mood and sleep disturbance (not wearing CPAP due to supply issue; sleeping on 1 pillow). The patient is not nervous/anxious.    Vitals:   03/13/21 1114  BP: (!) 139/103  Pulse: 68  Resp: 20  SpO2: 95%   Weight: 272 lb 2 oz (123.4 kg)  Height: 5\' 11"  (1.803 m)   Wt Readings from Last 3 Encounters:  03/13/21 272 lb 2 oz (123.4 kg)  03/05/21 256 lb (116.1 kg)  02/26/21 255 lb 15.3 oz (116.1 kg)   Lab Results  Component Value Date   CREATININE 2.67 (H) 03/13/2021   CREATININE 3.40 (H) 03/06/2021   CREATININE 3.68 (H) 03/05/2021    Physical Exam Vitals reviewed.  Constitutional:      Appearance: Normal appearance.  HENT:     Head: Normocephalic and atraumatic.  Cardiovascular:     Rate and Rhythm: Normal rate and regular rhythm.  Pulmonary:     Effort: Pulmonary effort is normal. No respiratory distress.     Breath sounds: No wheezing or rales.  Abdominal:     General: There is no distension.     Palpations: Abdomen is soft.  Musculoskeletal:     Cervical back: Normal range of motion and neck supple.     Right lower leg: No edema.     Left lower leg: No edema.  Skin:    General: Skin is warm and dry.  Neurological:     General: No focal deficit present.     Mental Status: He is alert and oriented to person, place, and time.     Motor: Weakness present.  Psychiatric:        Mood and Affect: Mood normal.        Behavior: Behavior normal.        Thought Content: Thought content normal.     Assessment & Plan:  1: Chronic heart failure with reduced ejection fraction- - NYHA Class II - euvolemic today - weighing daily; reminded to call for an overnight weight gain of >2 pounds or a weekly weight gain of >5 pounds - weight up 16 pounds from hospital discharge but he doesn't know if it has occurred suddenly or gradually because of increased appetite as his batteries need scales - on GDMT of carvedilol - farxiga and furosemide currently on hold (per discharge summary) will check BMP today to see if he can take his furosemide - not adding salt to his food - saw cardiology Rockey Situ) 12/09/20 - discussed paramedicine program and he is interested so referral placed today -  BNP 02/28/21 was 1449.1  2: HTN- - BP elevated today (139/103) but he's not entirely clear which medications he's taking and which he is holding - follows with PCP at [place] from 03/06/21 reviewed and showed sodium 134, potassium 3.9, creatinine 3.4 and  GFR 20 - saw nephrology Karl Pock) 02/13/21; instructed him to call their office about being seen to help aid in diuretic usage with his renal function  3: Obstructive sleep apnea- - sleeping well at night  - says that he needs new CPAP machine; advised him to call the company to follow-up as he was told there was a back-order - his untreated sleep apnea is certainly going to affect his heart and fluid retention   Patient did not bring his medications nor a list. Each medication was verbally reviewed with the patient and he was encouraged to bring the bottles to every visit to confirm accuracy of list.   Return in 3 weeks as already scheduled. Will plan on rechecking lab work at that time

## 2021-03-13 ENCOUNTER — Ambulatory Visit (HOSPITAL_BASED_OUTPATIENT_CLINIC_OR_DEPARTMENT_OTHER): Payer: Medicaid Other | Admitting: Family

## 2021-03-13 ENCOUNTER — Other Ambulatory Visit
Admission: RE | Admit: 2021-03-13 | Discharge: 2021-03-13 | Disposition: A | Discharge status: Home / Self Care | Payer: Medicaid Other | Source: Ambulatory Visit | Attending: Family | Admitting: Family

## 2021-03-13 ENCOUNTER — Telehealth: Payer: Self-pay

## 2021-03-13 ENCOUNTER — Other Ambulatory Visit: Payer: Self-pay

## 2021-03-13 ENCOUNTER — Encounter: Payer: Self-pay | Admitting: Family

## 2021-03-13 VITALS — BP 139/103 | HR 68 | Resp 20 | Ht 71.0 in | Wt 272.1 lb

## 2021-03-13 DIAGNOSIS — I428 Other cardiomyopathies: Secondary | ICD-10-CM | POA: Diagnosis not present

## 2021-03-13 DIAGNOSIS — E785 Hyperlipidemia, unspecified: Secondary | ICD-10-CM | POA: Insufficient documentation

## 2021-03-13 DIAGNOSIS — I480 Paroxysmal atrial fibrillation: Secondary | ICD-10-CM | POA: Insufficient documentation

## 2021-03-13 DIAGNOSIS — I340 Nonrheumatic mitral (valve) insufficiency: Secondary | ICD-10-CM | POA: Insufficient documentation

## 2021-03-13 DIAGNOSIS — N183 Chronic kidney disease, stage 3 unspecified: Secondary | ICD-10-CM | POA: Insufficient documentation

## 2021-03-13 DIAGNOSIS — I130 Hypertensive heart and chronic kidney disease with heart failure and stage 1 through stage 4 chronic kidney disease, or unspecified chronic kidney disease: Secondary | ICD-10-CM | POA: Insufficient documentation

## 2021-03-13 DIAGNOSIS — I10 Essential (primary) hypertension: Secondary | ICD-10-CM

## 2021-03-13 DIAGNOSIS — I252 Old myocardial infarction: Secondary | ICD-10-CM | POA: Insufficient documentation

## 2021-03-13 DIAGNOSIS — I5040 Unspecified combined systolic (congestive) and diastolic (congestive) heart failure: Secondary | ICD-10-CM | POA: Insufficient documentation

## 2021-03-13 DIAGNOSIS — I429 Cardiomyopathy, unspecified: Secondary | ICD-10-CM | POA: Insufficient documentation

## 2021-03-13 DIAGNOSIS — G4733 Obstructive sleep apnea (adult) (pediatric): Secondary | ICD-10-CM | POA: Insufficient documentation

## 2021-03-13 LAB — BASIC METABOLIC PANEL
Anion gap: 8 (ref 5–15)
BUN: 37 mg/dL — ABNORMAL HIGH (ref 6–20)
CO2: 23 mmol/L (ref 22–32)
Calcium: 9 mg/dL (ref 8.9–10.3)
Chloride: 108 mmol/L (ref 98–111)
Creatinine, Ser: 2.67 mg/dL — ABNORMAL HIGH (ref 0.61–1.24)
GFR, Estimated: 27 mL/min — ABNORMAL LOW (ref >60)
Glucose, Bld: 111 mg/dL — ABNORMAL HIGH (ref 70–99)
Potassium: 4.7 mmol/L (ref 3.5–5.1)
Sodium: 139 mmol/L (ref 135–145)

## 2021-03-13 NOTE — Telephone Encounter (Addendum)
Patient notified of below message. Patient acknowledged and he had no questions at this time. Informed patient to call for any symptoms or questions that arise before next appointment. Georg Ruddle, RN ----- Message from Alisa Graff, [place] sent at 03/13/2021  1:24 PM EST ----- Potassium level is normal and kidney function has improved some from your recent discharge. Only take the furosemide (lasix) if you need it for weight gain, swelling or shortness of breath. If you find that you're taking it every single day, please let us know

## 2021-03-13 NOTE — Patient Instructions (Addendum)
Begin weighing daily and call for an overnight weight gain of 3 pounds or more or a weekly weight gain of more than 5 pounds.    [facility] will be moving around the corner to [id number] mid-February. Our phone number will remain the same   Call your kidney doctor and ask to move your next appointment up

## 2021-03-19 ENCOUNTER — Other Ambulatory Visit (HOSPITAL_COMMUNITY): Payer: Self-pay

## 2021-03-19 NOTE — Progress Notes (Signed)
Today had a home visit with [place], he states been doing good.  He is not weighing, says he needs batteries, will order some and bring them by.  He has no edema in legs.  He denies any chest pain, headaches, dizziness or increased shortness of breath.  He is still holding his farxiga and has not had to use any furosemide.  He is aware to use furosemide with swelling.  Explained the program to him and he wants to be in it.  Gave him my contact information.  He states he needs a cardioloogy appts, scheduled him one and he states he will set up a ride for it.  He is living alone right now, his Mom is in rehab.  He is trying to get housing to move out of this house because landlord is not fixing the floors and other things.  He has everything for daily living such as food and utilities.  He has all his medications except his gout meds, and do not see that on his list.  He states pharmacy called him and they are getting it to him, will pick up today or tomorrow.  Gave him a med box and he states he likes using them, he states he can put them in it. He thought it was allupurinol.  Lungs are clear.  Will continue to visit for heart failure, diet and medication compliance.   [place] [phone]

## 2021-03-21 NOTE — Progress Notes (Signed)
Cardiology Office Note:    Date:  03/25/2021   ID:  Hector [name], DOB 08/28/1965, MRN [id number]  PCP:  [name], MD  [facility] Cardiologist:  Ida Rogue, MD  [place] Electrophysiologist:  None   Referring MD: [name], MD   Chief Complaint: hospital follow-up  History of Present Illness:    Hector [name] is a 56 y.o. male with a hx of  with a hx of chronic combined CHF/NICM likely secondary to alcohol use, HTN, HLD, paroxysmal afib, DM2, hypothyroidism, OSA on CPAP, CKD stage 3, COVID-19 11/2019 requiring hospitalization who is being seen for hospital follow-up.    Patient has low EF down to 20% in 2009.  EF 40 to 45% in 2018.  Catheterization 2016 showed nonobstructive CAD.  EF in 2021 was 45 to 50%.   Patient was admitted July 2022 for acute heart failure in the setting of medication noncompliance and concern for alcohol use versus hypertensive cardiomyopathy.  Echo showed EF 25 to 30% with global hypokinesis.  Cannot add ACE/ARB/spironolactone/Entresto given renal function.  Catheterization was avoided due to renal function.     ED visit 11/15/2020 for foot pain and tachycardia rates in the 130s.  Reported noncompliance with his medications.  He was given IV metoprolol and IV diltiazem with improvement of rates.  Previously But stopped due to elevated thyroid.   Patient has a history of GI bleed on Eliquis.  He was admitted October 2022 for GI bleed.  He had a colonoscopy which showed diverticulitis and internal hemorrhoids without active bleeding 07/22/2020.  GI saw patient and did not think another colonoscopy would give him any benefit.   Seen in the office 12/09/2020.  He was in normal sinus rhythm with PACs.  He was started on Farxiga.   ED visit 02/27/2020 for abdominal pain was given IV morphine, IV Zofran, IV fluids.  CT abdomen pelvis was unremarkable.  Lab work showed elevated BUN and creatinine.  Lipase was normal.  He was  admitted 1/7 with abdominal pain, he had stopped all his medications. He was in rapid aflutter, and ultimately underwent cardioversion. TEE showed LVEF <20%, severely reduced RV function. BNP was up and he was diuresed with IV lasix. CKD limited GDMT. Started on Farxiga. Lasix held on d/c for CKD.   Heart failure visit 1/19. Weight 272lbs. He was told to take lasix PRN.   Today, the patient reports he has been doing well since being at home. Lives by himself. He reports he has been taking all his medications. He is having left hand pain/problems. No chest pain, SOB, Lle, orthopnea, pnd. He has not been checking weights daily, dose not have battery for scale. EKG shows SB, HR 55bpm. Takes lasix as needed, has not taken it recently. He sees [facility] in May. He is supposed to get another CPAP machine, unsure when he will get it. Weight today 264lbs. Weight at d/c was 256lbs.     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 12/2007 MV: EF 28%, no isch;  b. 8/12 Echo: EF 25-35%; c. 02/2014 Echo: EF 20-25%; d. 12/2014 Cath: minimal CAD; e. 01/2015 Echo: EF 50-55%;  d. 05/2016 Echo: EF 30-35%, diff HK, gr2 DD; e. 11/2016 Echo: EF 45-50%, diff HK.   Chronic combined systolic (congestive) and diastolic (congestive) heart failure ([place])    a. 05/2016 Most recent Echo: EF 30-35%, diff HK, Gr2  DD, mild MR, mod dil LA; b. 11/2016 Echo: EF 45-50%, diff HK.   CKD (chronic kidney disease), stage III (HCC)    Elevated troponin    Chronically elevated. - level was 0.17-0.10 during recent admission   Essential hypertension    GI bleed 11/2013   Hyperlipidemia    Pancreatitis    Paroxysmal A-fib (HCC)    a. new onset s/p unsuccessful TEE/DCCV on 08/16/2014; b. on amio/eliquis (CHA2DS2VASc = 2-3); c. reports 1-2 hrs of afib ~ q76mos.   Paroxysmal atrial flutter ([place])    a. new onset 07/2014; b. s/p unsuccessful TEE/DCCV 08/16/2014; c. on apixaban.    Sleep-disordered breathing    Has yet to have a sleep study   Stroke [facility])     Past Surgical History:  Procedure Laterality Date   CARDIAC CATHETERIZATION N/A 01/09/2015   Procedure: Left Heart Cath and Coronary Angiography;  Surgeon: [name], MD;  Location: [place] CV LAB;  Service: Cardiovascular;  Laterality: N/A;   CARDIOVERSION N/A 03/05/2021   Procedure: CARDIOVERSION;  Surgeon: Kate Sable, MD;  Location: [facility] ORS;  Service: Cardiovascular;  Laterality: N/A;   COLONOSCOPY N/A 07/22/2020   Procedure: COLONOSCOPY;  Surgeon: Toledo, Benay Pike, MD;  Location: [facility] ENDOSCOPY;  Service: Gastroenterology;  Laterality: N/A;   ELECTROPHYSIOLOGIC STUDY N/A 08/16/2014   Procedure: CARDIOVERSION;  Surgeon: [name], MD;  Location: [facility] ORS;  Service: Cardiovascular;  Laterality: N/A;   FLEXIBLE SIGMOIDOSCOPY N/A 10/10/2015   Procedure: FLEXIBLE SIGMOIDOSCOPY;  Surgeon: [name], MD;  Location: [facility] ENDOSCOPY;  Service: Endoscopy;  Laterality: N/A;   KNEE SURGERY Right    NM MYOVIEW LTD  November 2011   No ischemia or infarction. EF 50-55% ( no improvement from 2009 Myoview EF of 28%   TEE WITHOUT CARDIOVERSION N/A 08/16/2014   Procedure: TRANSESOPHAGEAL ECHOCARDIOGRAM (TEE);  Surgeon: [name], MD;  Location: [facility] ORS;  Service: Cardiovascular;  Laterality: N/A;   TEE WITHOUT CARDIOVERSION N/A 03/05/2021   Procedure: TRANSESOPHAGEAL ECHOCARDIOGRAM (TEE);  Surgeon: Kate Sable, MD;  Location: [facility] ORS;  Service: Cardiovascular;  Laterality: N/A;    Current Medications: Current Meds  Medication Sig   allopurinol (ZYLOPRIM) 100 MG tablet Take 1 tablet by mouth daily.   apixaban (ELIQUIS) 5 MG TABS tablet Take 1 tablet (5 mg total) by mouth 2 (two) times daily.   atorvastatin (LIPITOR) 80 MG tablet Take 1 tablet (80 mg total) by mouth daily.   dapagliflozin propanediol (FARXIGA) 10 MG TABS tablet Take 1 tablet (10 mg total) by mouth daily before  breakfast. Hold this medication until you see your cardiologist and/or nephrologist   ferrous gluconate (FERGON) 324 MG tablet Take 324 mg by mouth daily with breakfast.   gabapentin (NEURONTIN) 300 MG capsule Take 300 mg by mouth 2 (two) times daily.   levothyroxine (SYNTHROID) 100 MCG tablet Take 100 mcg by mouth daily before breakfast.   metoprolol succinate (TOPROL-XL) 25 MG 24 hr tablet Take 0.5 tablets (12.5 mg total) by mouth daily.     Allergies:   Esomeprazole magnesium and Eggs or egg-derived products   Social History   Socioeconomic History   Marital status: Single    Spouse name: Not on file   Number of children: Not on file   Years of education: Not on file   Highest education level: Not on file  Occupational History   Not on file  Tobacco Use   Smoking status: Former    Packs/day: 1.00  Years: 12.00    Pack years: 12.00    Types: Cigarettes    Quit date: 02/23/1998    Years since quitting: 23.0   Smokeless tobacco: Never  Vaping Use   Vaping Use: Never used  Substance and Sexual Activity   Alcohol use: Not Currently    Alcohol/week: 0.0 standard drinks    Comment: Past heavy drinker   Drug use: No   Sexual activity: Not Currently  Other Topics Concern   Not on file  Social History Narrative   Not on file   Social Determinants of Health   Financial Resource Strain: Not on file  Food Insecurity: Not on file  Transportation Needs: Not on file  Physical Activity: Not on file  Stress: Not on file  Social Connections: Not on file     Family History: The patient's family history includes Diabetes in his mother; Hyperlipidemia in his mother; Hypertension in his mother.  ROS:   Please see the history of present illness.     All other systems reviewed and are negative.  EKGs/Labs/Other Studies Reviewed:    The following studies were reviewed today:  TEE 03/05/21 1. Left ventricular ejection fraction, by estimation, is <20%. The left  ventricle has  severely decreased function. The left ventricular internal  cavity size was mildly dilated.   2. Right ventricular systolic function is severely reduced. The right  ventricular size is normal.   3. Left atrial size was mildly dilated. No left atrial/left atrial  appendage thrombus was detected.   4. The mitral valve is normal in structure. Mild to moderate mitral valve  regurgitation.   5. The aortic valve is tricuspid. Aortic valve regurgitation is not  visualized.   6. Agitated saline contrast bubble study was negative, with no evidence  of any interatrial shunt.   TTE 10/30/20  1. Left ventricular ejection fraction, by estimation, is 30 to 35%. The  left ventricle has moderately decreased function. The left ventricle  demonstrates global hypokinesis. The left ventricular internal cavity size  was borderline dilated. There is mild   left ventricular hypertrophy.   2. Right ventricular systolic function is mildly reduced. The right  ventricular size is normal.   3. Mild to moderate mitral valve regurgitation.   EKG:  EKG is  ordered today.  The ekg ordered today demonstrates =SB 55bpm, iLBBB, nonspecific T wave changes.   Recent Labs: 11/15/2020: Magnesium 1.9; TSH 12.200 02/28/2021: ALT 10; B Natriuretic Peptide 1,449.1 03/06/2021: Hemoglobin 13.4; Platelets 246 03/13/2021: BUN 37; Creatinine, Ser 2.67; Potassium 4.7; Sodium 139  Recent Lipid Panel    Component Value Date/Time   CHOL 89 02/03/2020 0602   CHOL 249 (H) 10/11/2018 1226   CHOL 118 03/08/2014 0410   TRIG 125 02/03/2020 0602   TRIG 95 03/08/2014 0410   HDL 15 (L) 02/03/2020 0602   HDL 36 (L) 10/11/2018 1226   HDL 28 (L) 03/08/2014 0410   CHOLHDL 5.9 02/03/2020 0602   VLDL 25 02/03/2020 0602   VLDL 19 03/08/2014 0410   LDLCALC 49 02/03/2020 0602   LDLCALC 174 (H) 10/11/2018 1226   LDLCALC 71 03/08/2014 0410      Physical Exam:    VS:  BP (!) 130/100 (BP Location: Left Arm, Patient Position: Sitting, Cuff  Size: Large)    Pulse (!) 55    Ht 5\' 11"  (1.803 m)    Wt 264 lb (119.7 kg)    SpO2 98%    BMI 36.82 kg/m  Wt Readings from Last 3 Encounters:  03/25/21 264 lb (119.7 kg)  03/13/21 272 lb 2 oz (123.4 kg)  03/05/21 256 lb (116.1 kg)     GEN:  Well nourished, well developed in no acute distress HEENT: Normal NECK: No JVD; No carotid bruits LYMPHATICS: No lymphadenopathy CARDIAC: bradycardia, RR, no murmurs, rubs, gallops RESPIRATORY:  Clear to auscultation without rales, wheezing or rhonchi  ABDOMEN: Soft, non-tender, non-distended MUSCULOSKELETAL:  No edema; No deformity  SKIN: Warm and dry NEUROLOGIC:  Alert and oriented x 3 PSYCHIATRIC:  Normal affect   ASSESSMENT:    1. Paroxysmal atrial flutter ([place])   2. Acute on chronic HFrEF (heart failure with reduced ejection fraction) ([place])   3. Atrial flutter, unspecified type ([place])   4. Chronic systolic heart failure ([place])   5. Nonischemic cardiomyopathy ([place])   6. Obstructive sleep apnea   7. Stage 3b chronic kidney disease (CKD) (HCC)    PLAN:    In order of problems listed above:  Aflutter RVR Recent admission for Afib RVR and Acute CHF, had been off all his medications. He was successfully cardioverted. Off amio secondary to thyroid disease. Not a good candidate for digoxin or Tikosyn with renal disease. He was sent home on Toprol and Eliquis. Today, EKG shows SB HR 55bpm. He reports medication compliance. He denies bleeding issues on Eliquis. Continue rate control with Toprol 12.5mg  daily. I will send to EP to consider aflutter ablation.   Chronic HFrEF with RV dysfunction NICM Echo 10/29/20 showed LVEF 30-35%. Recent TEE during admission showed LVEF <20%, severely reduced RV function, mild to mod MR. He was sent home on Farxiga and Toprol, CKD was limiting ARB/Acei/Entresto/Arni. Lasix was held as well. Prior to admission he was on lasix 40mg  daily, but not fully compliant. According to our scale weight is up about 10lbs  since dicharge, he does not weight at home. Encouraged low salt diet and daily weights. I will re-start lasix 40mg  daily and check a BMET in a week. I recommended he make f/u with nephrology soon. We can re-check an echo in a couple months to reassess pump function, EP may need to consider ICD after f/u echo.    CKD BMET at follow-up 1/19 showed Scr 2.67, which appears to be around baseline. Encouraged him to get sooner follow-up with nephrology.   OSA Had CPAP, however he needs another one. Encouaged patient to call the company.   Elevated troponin Troponin mildly elevated in the setting of Acute CHF and rapid aflutter, suspected demand ischemia. Ischemic evaluation deferred due to CKD. He denies anginal symptoms. Given worsening CM can get Myoview stress test in the future, if EF is still low.   Disposition: Follow up in 6-8 week(s) with Md/APP     Signed, Tomie Elko Ninfa Meeker, PA-C  03/25/2021 1:12 PM    [facility]

## 2021-03-25 ENCOUNTER — Ambulatory Visit (INDEPENDENT_AMBULATORY_CARE_PROVIDER_SITE_OTHER): Payer: Medicaid Other | Admitting: Medical

## 2021-03-25 ENCOUNTER — Other Ambulatory Visit: Payer: Self-pay

## 2021-03-25 ENCOUNTER — Encounter: Payer: Self-pay | Admitting: Medical

## 2021-03-25 VITALS — BP 130/100 | HR 55 | Ht 71.0 in | Wt 264.0 lb

## 2021-03-25 DIAGNOSIS — I5022 Chronic systolic (congestive) heart failure: Secondary | ICD-10-CM | POA: Diagnosis not present

## 2021-03-25 DIAGNOSIS — I5023 Acute on chronic systolic (congestive) heart failure: Secondary | ICD-10-CM | POA: Diagnosis not present

## 2021-03-25 DIAGNOSIS — I4892 Unspecified atrial flutter: Secondary | ICD-10-CM

## 2021-03-25 DIAGNOSIS — N1832 Chronic kidney disease, stage 3b: Secondary | ICD-10-CM

## 2021-03-25 DIAGNOSIS — G4733 Obstructive sleep apnea (adult) (pediatric): Secondary | ICD-10-CM

## 2021-03-25 DIAGNOSIS — I428 Other cardiomyopathies: Secondary | ICD-10-CM | POA: Diagnosis not present

## 2021-03-25 MED ORDER — FUROSEMIDE 40 MG PO TABS: 40.0000 mg | ORAL_TABLET | Freq: Every day | ORAL | 5 refills | Status: DC

## 2021-03-25 NOTE — Patient Instructions (Signed)
Medication Instructions:   Your physician has recommended you make the following change in your medication:   START taking furosemide (Lasix) 40 mg daily   *If you need a refill on your cardiac medications before your next appointment, please call your pharmacy*   Lab Work: Your physician recommends that you return for lab work (BMET) in: 1 week    Please return to our office on_____________________at______________am/pm   If you have labs (blood work) drawn today and your tests are completely normal, you will receive your results only by: [place] (if you have MyChart) OR A paper copy in the mail If you have any lab test that is abnormal or we need to change your treatment, we will call you to review the results.   Testing/Procedures: None ordered   Follow-Up: At [facility], you and your health needs are our priority.  As part of our continuing mission to provide you with exceptional heart care, we have created designated Provider Care Teams.  These Care Teams include your primary Cardiologist (physician) and Advanced Practice Providers (APPs -  Physician Assistants and Nurse Practitioners) who all work together to provide you with the care you need, when you need it.  We recommend signing up for the patient portal called "MyChart".  Sign up information is provided on this After Visit Summary.  MyChart is used to connect with patients for Virtual Visits (Telemedicine).  Patients are able to view lab/test results, encounter notes, upcoming appointments, etc.  Non-urgent messages can be sent to your provider as well.   To learn more about what you can do with MyChart, go to [email or web address].    Your next appointment:   6-8 week(s)  The format for your next appointment:   In Person  Provider:   You may see Ida Rogue, MD or one of the following Advanced Practice Providers on your designated Care Team:   Murray Hodgkins, NP Christell Faith, PA-C Cadence Kathlen Mody,  PA-C1}    Other Instructions We have referred you to electrophysiology

## 2021-04-01 ENCOUNTER — Other Ambulatory Visit: Payer: Self-pay

## 2021-04-01 ENCOUNTER — Other Ambulatory Visit (INDEPENDENT_AMBULATORY_CARE_PROVIDER_SITE_OTHER): Payer: Medicaid Other

## 2021-04-01 DIAGNOSIS — I5023 Acute on chronic systolic (congestive) heart failure: Secondary | ICD-10-CM

## 2021-04-02 ENCOUNTER — Telehealth: Payer: Self-pay | Admitting: Emergency Medicine

## 2021-04-02 DIAGNOSIS — I5023 Acute on chronic systolic (congestive) heart failure: Secondary | ICD-10-CM

## 2021-04-02 LAB — BASIC METABOLIC PANEL
BUN/Creatinine Ratio: 12 (ref 9–20)
BUN: 30 mg/dL — ABNORMAL HIGH (ref 6–24)
CO2: 16 mmol/L — ABNORMAL LOW (ref 20–29)
Calcium: 9.5 mg/dL (ref 8.7–10.2)
Chloride: 109 mmol/L — ABNORMAL HIGH (ref 96–106)
Creatinine, Ser: 2.51 mg/dL — ABNORMAL HIGH (ref 0.76–1.27)
Glucose: 107 mg/dL — ABNORMAL HIGH (ref 70–99)
Potassium: 5.9 mmol/L — ABNORMAL HIGH (ref 3.5–5.2)
Sodium: 146 mmol/L — ABNORMAL HIGH (ref 134–144)
eGFR: 29 mL/min/{1.73_m2} — ABNORMAL LOW (ref >59)

## 2021-04-02 NOTE — Addendum Note (Signed)
Addended by: Mila Merry on: 04/02/2021 12:51 PM   Modules accepted: Orders

## 2021-04-02 NOTE — Telephone Encounter (Signed)
Called patient, went over results and recommendations. Pt verbalized understanding, and questions, if any, were answered.   Patient voiced appreciation for the call and understands that they can reach out to our office with any questions or concerns.

## 2021-04-02 NOTE — Telephone Encounter (Signed)
Spoke to [employer], PA-C about elevated K+ of 5.9   Pt isn't on potassium supplementation or any medications that should raise his potassium.   Provider would like patient to come in for a redraw in the event that the specimen was hemolyzed.   Called patient, however he was on hold with [place], so he will call back to make appointment.   Order placed for repeat BMET.

## 2021-04-02 NOTE — Telephone Encounter (Signed)
-----   Message from [place], PA-C sent at 04/02/2021 11:59 AM EST ----- Labs show stable kidney function. Continue current lasix dose

## 2021-04-03 ENCOUNTER — Emergency Department: Payer: Medicaid Other

## 2021-04-03 ENCOUNTER — Other Ambulatory Visit: Payer: Self-pay

## 2021-04-03 ENCOUNTER — Other Ambulatory Visit (INDEPENDENT_AMBULATORY_CARE_PROVIDER_SITE_OTHER): Payer: Medicaid Other

## 2021-04-03 ENCOUNTER — Emergency Department
Admission: EM | Admit: 2021-04-03 | Discharge: 2021-04-03 | Disposition: A | Discharge status: Home / Self Care | Payer: Medicaid Other | Attending: Emergency Medicine | Admitting: Emergency Medicine

## 2021-04-03 DIAGNOSIS — M549 Dorsalgia, unspecified: Secondary | ICD-10-CM | POA: Diagnosis present

## 2021-04-03 DIAGNOSIS — R109 Unspecified abdominal pain: Secondary | ICD-10-CM | POA: Insufficient documentation

## 2021-04-03 DIAGNOSIS — N189 Chronic kidney disease, unspecified: Secondary | ICD-10-CM | POA: Insufficient documentation

## 2021-04-03 DIAGNOSIS — I130 Hypertensive heart and chronic kidney disease with heart failure and stage 1 through stage 4 chronic kidney disease, or unspecified chronic kidney disease: Secondary | ICD-10-CM | POA: Insufficient documentation

## 2021-04-03 DIAGNOSIS — I5023 Acute on chronic systolic (congestive) heart failure: Secondary | ICD-10-CM

## 2021-04-03 DIAGNOSIS — M545 Low back pain, unspecified: Secondary | ICD-10-CM | POA: Diagnosis not present

## 2021-04-03 DIAGNOSIS — I5030 Unspecified diastolic (congestive) heart failure: Secondary | ICD-10-CM | POA: Diagnosis not present

## 2021-04-03 LAB — URINALYSIS, ROUTINE W REFLEX MICROSCOPIC
Bacteria, UA: NONE SEEN
Bilirubin Urine: NEGATIVE
Glucose, UA: NEGATIVE mg/dL
Ketones, ur: NEGATIVE mg/dL
Nitrite: NEGATIVE
Protein, ur: >=300 mg/dL — AB
Specific Gravity, Urine: 1.013 (ref 1.005–1.030)
pH: 5 (ref 5.0–8.0)

## 2021-04-03 IMAGING — CT CT RENAL STONE PROTOCOL
2 of 4 series · 16 of 46 positions shown, 18 images · non-contrast
Comparison: [DATE]

CLINICAL DATA: Flank/low back pain



[Series 2: stone full standard · axial · 0.98mm/px · z∈[-790,-306]mm · 13 of 107 slices shown, 15 images]
[im 5/107  soft-tissue]
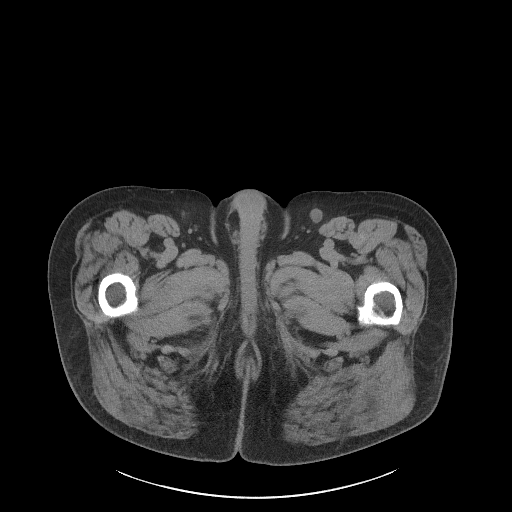
[im 5/107  bone]
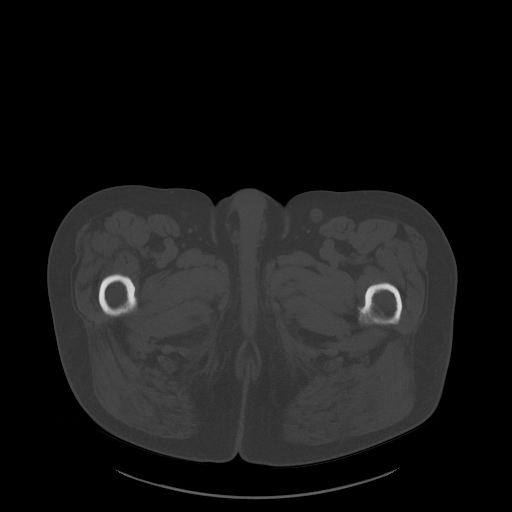
[im 13/107  soft-tissue]
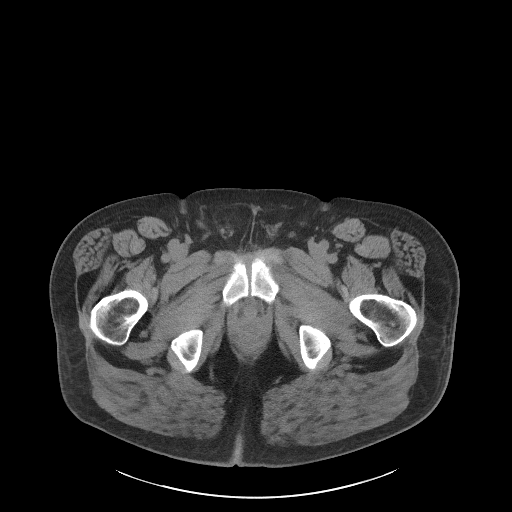
[im 21/107  soft-tissue]
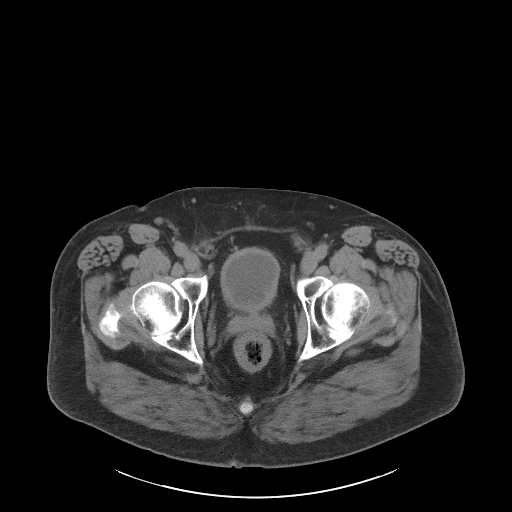
[im 29/107  soft-tissue]
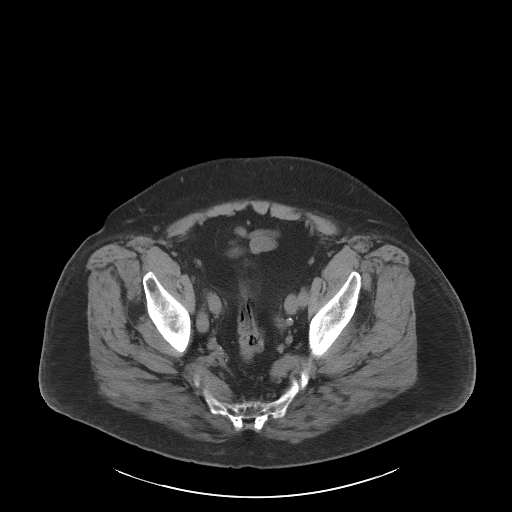
[im 37/107  soft-tissue]
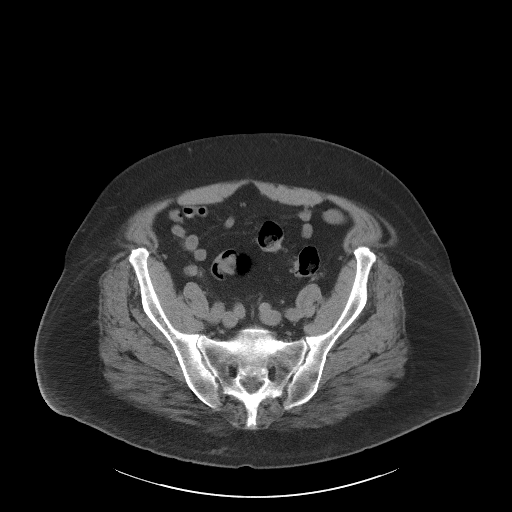
[im 45/107  soft-tissue]
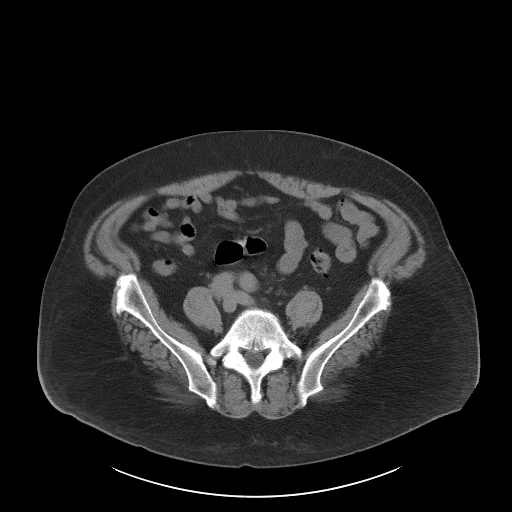
[im 54/107  soft-tissue]
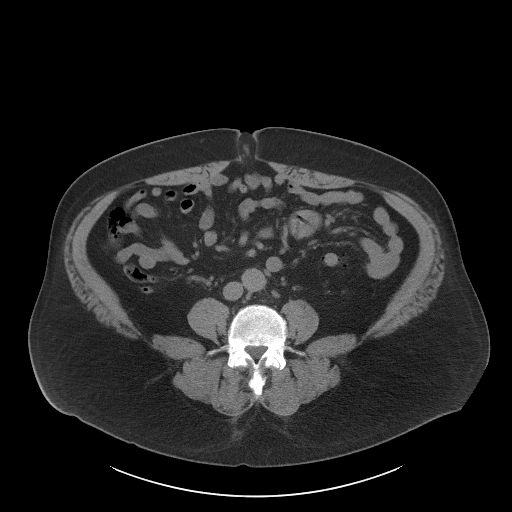
[im 62/107  soft-tissue]
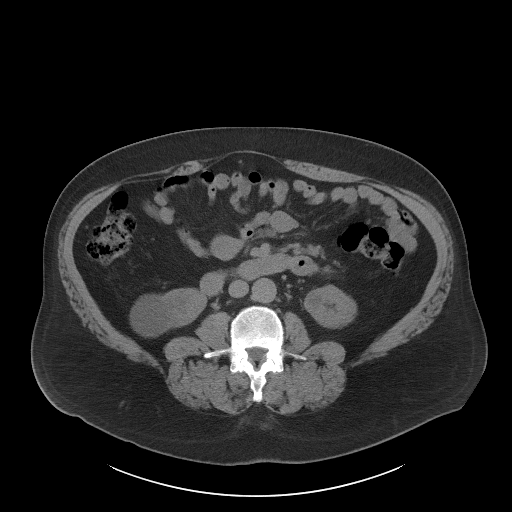
[im 70/107  soft-tissue]
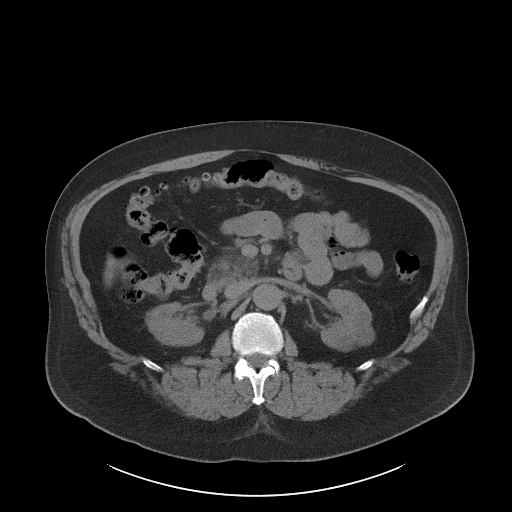
[im 70/107  bone]
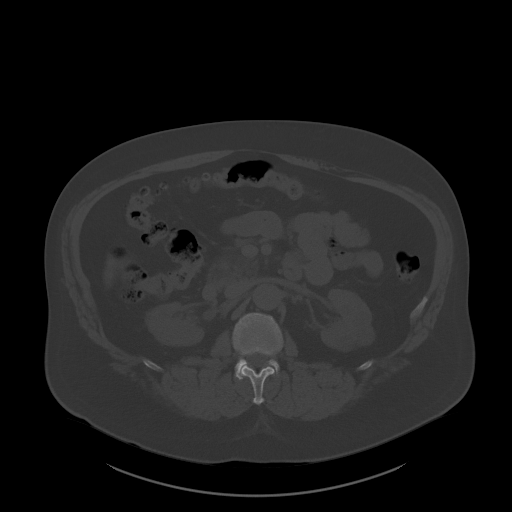
[im 78/107  soft-tissue]
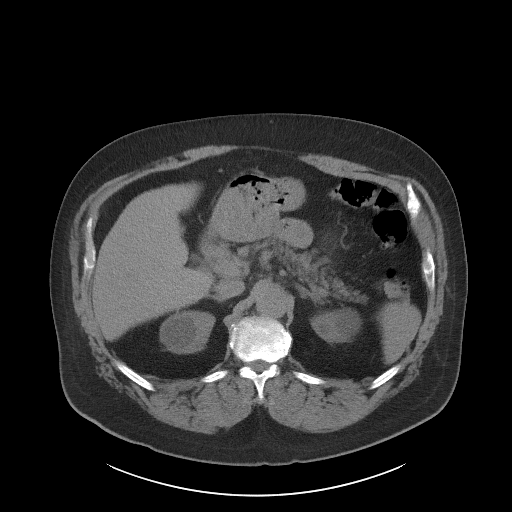
[im 86/107  soft-tissue]
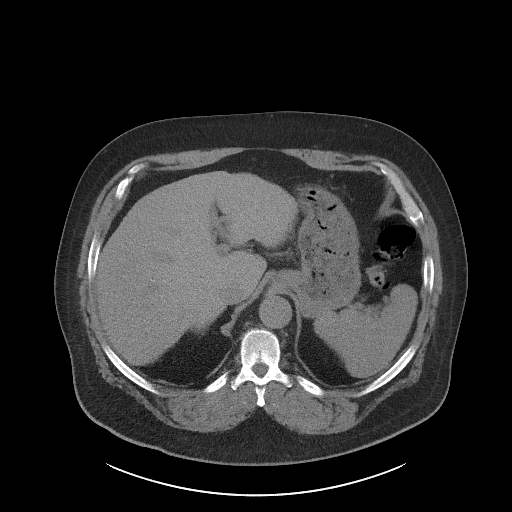
[im 94/107  soft-tissue]
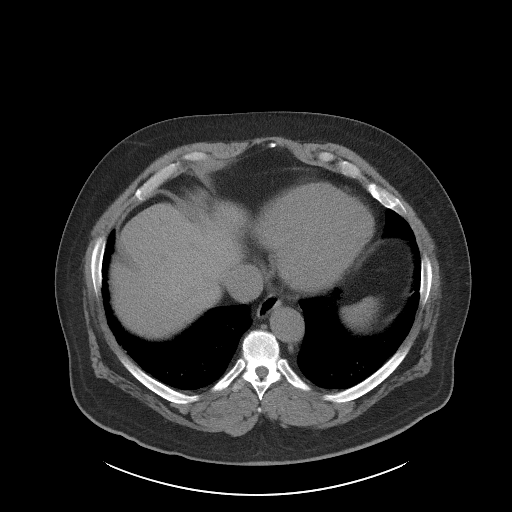
[im 102/107  soft-tissue]
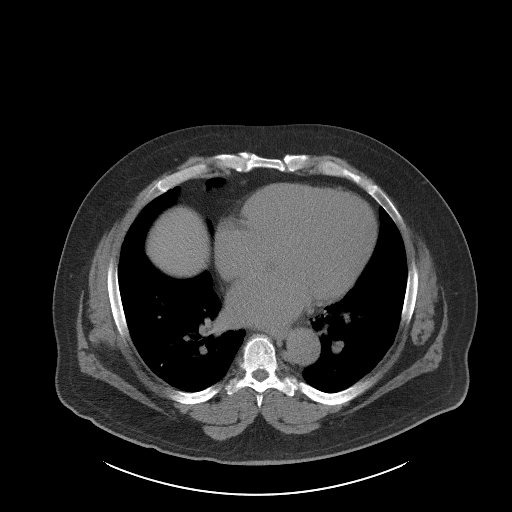

[Series 5: coronal · coronal · 0.88mm/px · 3 of 177 slices shown]
[im 59/177  soft-tissue]
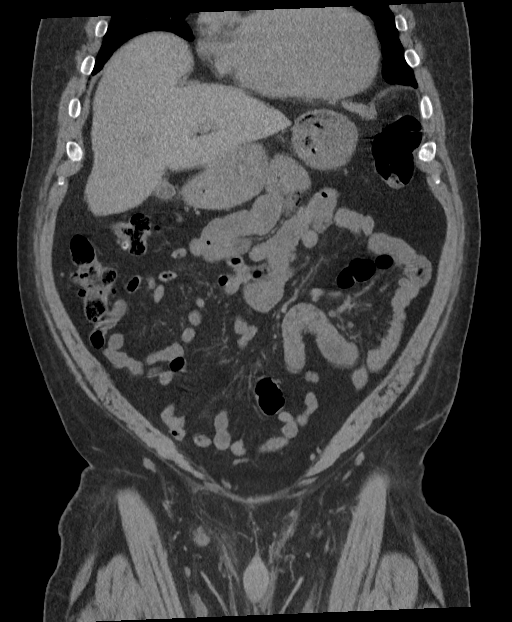
[im 79/177  soft-tissue]
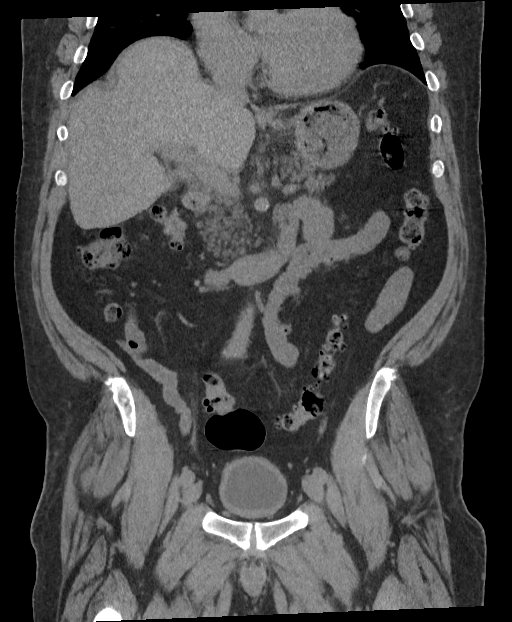
[im 98/177  soft-tissue]
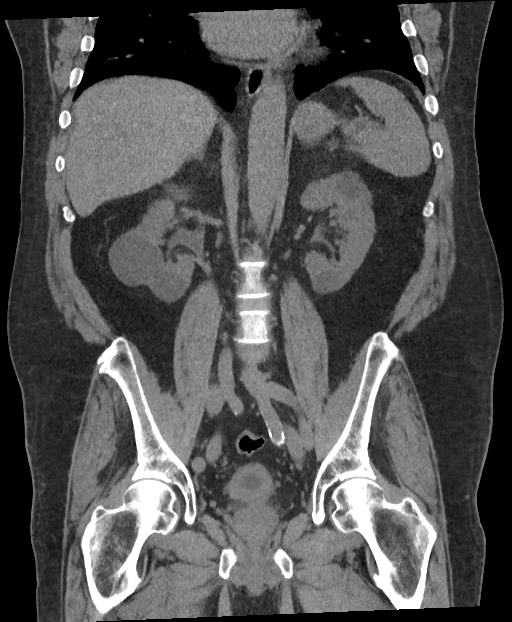

[16 of 46 positions shown; findings below may reference images not displayed]

FINDINGS: Motion degraded images.

Lower chest: Calcified granulomata in the posterior right lower lobe
(series 4/images 2 and 6), benign.

Hepatobiliary: Unenhanced liver is notable for a mildly macronodular
hepatic contour.

Tiny layering gallstone (series 2/image 32), without associated
inflammatory changes. No intrahepatic or extrahepatic ductal
dilatation.

Pancreas: Within normal limits.

Spleen: Within normal limits.

Adrenals/Urinary Tract: Adrenal glands are within normal limits.

Multiple bilateral renal cysts, poorly evaluated on unenhanced CT
but favored to be simple, including a dominant 5.2 cm cyst in the
lateral right lower kidney (series 2/image 43). No renal, ureteral,
or bladder calculi. No hydronephrosis.

Thick-walled bladder with mild perivesical stranding, raising the
possibility of cystitis.

Stomach/Bowel: Stomach is within normal limits.

No evidence of bowel obstruction.

Normal appendix (series 2/image 87).

Left colonic diverticulosis, without evidence of diverticulitis.

Vascular/Lymphatic: No evidence of abdominal aortic aneurysm.

Atherosclerotic calcifications of the abdominal aorta and branch
vessels.

No suspicious abdominopelvic lymphadenopathy.

Reproductive: Prostate is unremarkable.

Other: No abdominopelvic ascites.

Musculoskeletal: Visualized osseous structures are within normal
limits.
IMPRESSION: Thick-walled bladder, correlate for cystitis.

No renal, ureteral, or bladder calculi.  No hydronephrosis.

Multiple bilateral renal cysts, measuring up to 5.2 cm in the right
lower kidney, poorly evaluated on unenhanced CT but likely
simple/benign.

Cholelithiasis, without associated inflammatory changes.

## 2021-04-03 MED ORDER — HYDROCODONE-ACETAMINOPHEN 5-325 MG PO TABS: 1.0000 | ORAL_TABLET | Freq: Three times a day (TID) | ORAL | 0 refills | Status: AC | PRN

## 2021-04-03 MED ORDER — HYDROCODONE-ACETAMINOPHEN 5-325 MG PO TABS
1.0000 | ORAL_TABLET | Freq: Once | ORAL | Status: AC
  Administered 2021-04-03: 1 via ORAL
  Filled 2021-04-03: qty 1

## 2021-04-03 NOTE — ED Provider Notes (Signed)
[facility] Emergency Department Provider Note     Event Date/Time   First MD Initiated Contact with Patient 04/03/21 1954     (approximate)   History   Back Pain   HPI  Hector [name] is a 56 y.o. male with a history of CKD, hypertension, OSA, CHF w EF 20-25%, presents to the ED for evaluation of 2 days of back pain as well as some abdominal discomfort.  Patient also notes some dark-colored urine.  He has a history of CHF, but took his Lasix, and has had resolution of his previous complaints of dark-colored urine the patient had routine labs drawn today for the heart failure clinic and they are available for review.  He denies any recent trauma or falls.     Physical Exam   Triage Vital Signs: ED Triage Vitals  Enc Vitals Group     BP 04/03/21 1926 (!) 137/100     Pulse Rate 04/03/21 1926 65     Resp 04/03/21 1926 18     Temp 04/03/21 1926 98 F (36.7 C)     Temp Source 04/03/21 1926 Oral     SpO2 04/03/21 1926 96 %     Weight 04/03/21 1927 245 lb (111.1 kg)     Height 04/03/21 1927 5\' 11"  (1.803 m)     Head Circumference --      Peak Flow --      Pain Score 04/03/21 1927 7     Pain Loc --      Pain Edu? --      Excl. in [place]? --     Most recent vital signs: Vitals:   04/03/21 1926 04/03/21 2134  BP: (!) 137/100 140/85  Pulse: 65 70  Resp: 18 20  Temp: 98 F (36.7 C)   SpO2: 96% 96%    General Awake, no distress.  CV:  Good peripheral perfusion.  RESP:  Normal effort.  ABD:  No distention. Bilateral CVA tenderness  ED Results / Procedures / Treatments   Labs (all labs ordered are listed, but only abnormal results are displayed) Labs Reviewed  URINALYSIS, ROUTINE W REFLEX MICROSCOPIC - Abnormal; Notable for the following components:      Result Value   Color, Urine YELLOW (*)    APPearance CLEAR (*)    Hgb urine dipstick MODERATE (*)    Protein, ur >=300 (*)    Leukocytes,Ua TRACE (*)    All other components within  normal limits     EKG  RADIOLOGY  I personally viewed and evaluated these images as part of my medical decision making, as well as reviewing the written report by the radiologist.  ED Provider Interpretation: no acute findings}  CT Renal Stone Study  Result Date: 04/03/2021 CLINICAL DATA:  Flank/low back pain EXAM: CT ABDOMEN AND PELVIS WITHOUT CONTRAST TECHNIQUE: Multidetector CT imaging of the abdomen and pelvis was performed following the standard protocol without IV contrast. RADIATION DOSE REDUCTION: This exam was performed according to the departmental dose-optimization program which includes automated exposure control, adjustment of the mA and/or kV according to patient size and/or use of iterative reconstruction technique. COMPARISON:  02/26/2021 FINDINGS: Motion degraded images. Lower chest: Calcified granulomata in the posterior right lower lobe (series 4/images 2 and 6), benign. Hepatobiliary: Unenhanced liver is notable for a mildly macronodular hepatic contour. Tiny layering gallstone (series 2/image 32), without associated inflammatory changes. No intrahepatic or extrahepatic ductal dilatation. Pancreas: Within normal limits. Spleen: Within normal limits.  Adrenals/Urinary Tract: Adrenal glands are within normal limits. Multiple bilateral renal cysts, poorly evaluated on unenhanced CT but favored to be simple, including a dominant 5.2 cm cyst in the lateral right lower kidney (series 2/image 43). No renal, ureteral, or bladder calculi. No hydronephrosis. Thick-walled bladder with mild perivesical stranding, raising the possibility of cystitis. Stomach/Bowel: Stomach is within normal limits. No evidence of bowel obstruction. Normal appendix (series 2/image 87). Left colonic diverticulosis, without evidence of diverticulitis. Vascular/Lymphatic: No evidence of abdominal aortic aneurysm. Atherosclerotic calcifications of the abdominal aorta and branch vessels. No suspicious abdominopelvic  lymphadenopathy. Reproductive: Prostate is unremarkable. Other: No abdominopelvic ascites. Musculoskeletal: Visualized osseous structures are within normal limits. IMPRESSION: Thick-walled bladder, correlate for cystitis. No renal, ureteral, or bladder calculi.  No hydronephrosis. Multiple bilateral renal cysts, measuring up to 5.2 cm in the right lower kidney, poorly evaluated on unenhanced CT but likely simple/benign. Cholelithiasis, without associated inflammatory changes. Electronically Signed   By: Julian Hy M.D.   On: 04/03/2021 20:44     PROCEDURES:  Critical Care performed: No  Procedures   MEDICATIONS ORDERED IN ED: Medications  HYDROcodone-acetaminophen (NORCO/VICODIN) 5-325 MG per tablet 1 tablet (1 tablet Oral Given 04/03/21 2038)     IMPRESSION / MDM / ASSESSMENT AND PLAN / ED COURSE  I reviewed the triage vital signs and the nursing notes.                              Differential diagnosis includes, but is not limited to, .nephrolithiasis, pyelonephritis, MSK  Patient's diagnosis is consistent with lumbar strain and probable musculoskeletal pain.  No CT evidence of acute nephrolithiasis or hydronephrosis, but incidental finding of a likely benign renal cyst noted on my review the images and radiologist report.  Previous labs today are overall stable, patient has elevated BNP which is noted.  Urinalysis did not show any acute leukocytosis or nitrite positive urine.  No elevation in his white count or any gross electrolyte abnormalities on BMP above this time.  Patient will be discharged home with prescriptions for hydrocodone. Patient is to follow up with his primary provider as needed or otherwise directed. Patient is given ED precautions to return to the ED for any worsening or new symptoms.   FINAL CLINICAL IMPRESSION(S) / ED DIAGNOSES   Final diagnoses:  Acute bilateral low back pain without sciatica     Rx / DC Orders   ED Discharge Orders           Ordered    HYDROcodone-acetaminophen (NORCO) 5-325 MG tablet  3 times daily PRN        04/03/21 2121             Note:  This document was prepared using Dragon voice recognition software and may include unintentional dictation errors.    Melvenia Needles, PA-C 04/03/21 2342    Vanessa Couderay, MD 04/04/21 [phone]

## 2021-04-03 NOTE — ED Triage Notes (Signed)
Pt c/o lower back pain that started 2 days ago. Pt c/o lower abdominal pain & "dark" urine.

## 2021-04-03 NOTE — Discharge Instructions (Signed)
Your recent blood labs shows increased BNP, you should take your lasix as prescribed for increased fluid. Your Urine and CT scan are normal and reassuring. They do not show any signs of a UTI or kidney infection. Take the pain medicine as directed and follow-up with your primary provider or the Heart Failure Clinic as needed.

## 2021-04-03 NOTE — ED Notes (Signed)
Pt via EMS from home. Pt c/o lower back pain and lower abd pain. EMS reports that he thinks its his kidney since last night. Urine is dark. Pt is on blood thinners.   124/84 62 HR 97% on RA Afebrile

## 2021-04-04 LAB — BASIC METABOLIC PANEL
BUN/Creatinine Ratio: 13 (ref 9–20)
BUN: 30 mg/dL — ABNORMAL HIGH (ref 6–24)
CO2: 20 mmol/L (ref 20–29)
Calcium: 9.5 mg/dL (ref 8.7–10.2)
Chloride: 102 mmol/L (ref 96–106)
Creatinine, Ser: 2.27 mg/dL — ABNORMAL HIGH (ref 0.76–1.27)
Glucose: 98 mg/dL (ref 70–99)
Potassium: 4.7 mmol/L (ref 3.5–5.2)
Sodium: 141 mmol/L (ref 134–144)
eGFR: 33 mL/min/{1.73_m2} — ABNORMAL LOW (ref >59)

## 2021-04-21 ENCOUNTER — Ambulatory Visit: Payer: Medicaid Other | Attending: Family | Admitting: Family

## 2021-04-21 ENCOUNTER — Encounter: Payer: Self-pay | Admitting: Family

## 2021-04-21 ENCOUNTER — Other Ambulatory Visit: Payer: Self-pay

## 2021-04-21 VITALS — BP 158/100 | HR 70 | Resp 20 | Ht 71.0 in | Wt 268.2 lb

## 2021-04-21 DIAGNOSIS — E785 Hyperlipidemia, unspecified: Secondary | ICD-10-CM | POA: Diagnosis not present

## 2021-04-21 DIAGNOSIS — M545 Low back pain, unspecified: Secondary | ICD-10-CM | POA: Insufficient documentation

## 2021-04-21 DIAGNOSIS — R200 Anesthesia of skin: Secondary | ICD-10-CM | POA: Diagnosis not present

## 2021-04-21 DIAGNOSIS — M25542 Pain in joints of left hand: Secondary | ICD-10-CM | POA: Diagnosis not present

## 2021-04-21 DIAGNOSIS — Z79899 Other long term (current) drug therapy: Secondary | ICD-10-CM | POA: Diagnosis not present

## 2021-04-21 DIAGNOSIS — G589 Mononeuropathy, unspecified: Secondary | ICD-10-CM | POA: Insufficient documentation

## 2021-04-21 DIAGNOSIS — F1011 Alcohol abuse, in remission: Secondary | ICD-10-CM | POA: Insufficient documentation

## 2021-04-21 DIAGNOSIS — G4733 Obstructive sleep apnea (adult) (pediatric): Secondary | ICD-10-CM | POA: Insufficient documentation

## 2021-04-21 DIAGNOSIS — I5023 Acute on chronic systolic (congestive) heart failure: Secondary | ICD-10-CM | POA: Diagnosis not present

## 2021-04-21 DIAGNOSIS — I252 Old myocardial infarction: Secondary | ICD-10-CM | POA: Diagnosis not present

## 2021-04-21 DIAGNOSIS — I10 Essential (primary) hypertension: Secondary | ICD-10-CM | POA: Diagnosis not present

## 2021-04-21 DIAGNOSIS — I480 Paroxysmal atrial fibrillation: Secondary | ICD-10-CM | POA: Diagnosis not present

## 2021-04-21 DIAGNOSIS — N183 Chronic kidney disease, stage 3 unspecified: Secondary | ICD-10-CM | POA: Insufficient documentation

## 2021-04-21 DIAGNOSIS — Z7984 Long term (current) use of oral hypoglycemic drugs: Secondary | ICD-10-CM | POA: Insufficient documentation

## 2021-04-21 DIAGNOSIS — I130 Hypertensive heart and chronic kidney disease with heart failure and stage 1 through stage 4 chronic kidney disease, or unspecified chronic kidney disease: Secondary | ICD-10-CM | POA: Diagnosis not present

## 2021-04-21 DIAGNOSIS — I429 Cardiomyopathy, unspecified: Secondary | ICD-10-CM | POA: Diagnosis not present

## 2021-04-21 NOTE — Patient Instructions (Addendum)
Take your medications with you to your appointment at [facility].  Bring your medications with you to your next appt with [place].  Take your lasix 40 mg every day.  Get you some batteries for your scale, go to the [facility] and see if they have some.  Return in 1 month, BRING ALL OF YOUR MEDICATIONS WITH YOU.

## 2021-04-21 NOTE — Progress Notes (Signed)
[name], male    DOB: 03-14-1965, 56 y.o.   MRN: [id number]   Hector [name] is a 56 y/o male with a history of MI, hyperlipidemia, HTN, CKD, atrial fibrillation, alcohol use, cardiomyopathy, and CHF.   TEE report from 03/05/21 reviewed and showed an EF of <20% along with mild/moderate Hector but no thrombus. Echo report from 09/13/20 reviewed showed an EF of 25-30% along with moderate Hector. Echo done 12/03/16 showed an EF of 45-50% which is an improvement from previous echo which was done on 06/02/16 and showed an EF of 30-35%. Prior echo on 02/21/15 which showed a normalization of Hector [name] EF which was 50-55% with mild Hector. No longer ICD candidate. Prior echo on 03/07/14 showed an EF of 20-25% with mild/mod Hector.   Last left heart cath was done 01/09/15.   ED visit on 04/03/21 for lower back pain. Admitted 03/01/21 due to SOB and abdominal pain/ distention. Found to be in a flutter. Initially given IV lasix with transition to oral diuretics. EP & cardiology consults obtained. Successful cardioversion completed. Discharged after 5 days. Was in the ED 02/26/21 due to lower abdominal pain. Abdominal CT was negative. Admission offered but patient declined and Hector [name] was released.   Hector [name] presents today for a follow-up visit with a chief complaint of minimal fatigue upon moderate exertion. Hector [name] describes this as chronic in nature having been present for several years, and worsening over the last few days. Hector [name] has associated shortness of breath, pedal edema, abdominal distention, left arm/hand numbness & chronic pain along with this. Hector [name] denies any difficulty sleeping, dizziness, palpitations, chest pain or cough.   Hector [name] weight is 272, up from Hector [name] baseline of 264. Hector [name] has been taking Hector [name] lasix PRN, although prescribed QD, and took one dose two days ago for pedal edema. Admits to making poor dietary choices over the last few days.  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 12/2007 MV: EF 28%, no isch;  b. 8/12 Echo: EF 25-35%; c. 02/2014 Echo: EF 20-25%; d. 12/2014 Cath: minimal CAD; e. 01/2015 Echo: EF 50-55%;  d. 05/2016 Echo: EF 30-35%, diff HK, gr2 DD; e. 11/2016 Echo: EF 45-50%, diff HK.   Chronic combined systolic (congestive) and diastolic (congestive) heart failure ([place])    a. 05/2016 Most recent Echo: EF 30-35%, diff HK, Gr2 DD, mild Hector, mod dil LA; b. 11/2016 Echo: EF 45-50%, diff HK.   CKD (chronic kidney disease), stage III (HCC)    Elevated troponin    Chronically elevated. - level was 0.17-0.10 during recent admission   Essential hypertension    GI bleed 11/2013   Hyperlipidemia    Pancreatitis    Paroxysmal A-fib (HCC)    a. new onset s/p unsuccessful TEE/DCCV on 08/16/2014; b. on amio/eliquis (CHA2DS2VASc = 2-3); c. reports 1-2 hrs of afib ~ q2mos.   Paroxysmal atrial flutter ([place])    a. new onset 07/2014; b. s/p unsuccessful TEE/DCCV 08/16/2014; c. on apixaban.   Sleep-disordered breathing    Has yet to have a sleep study   Stroke [facility])    Past Surgical History:  Procedure Laterality Date   CARDIAC CATHETERIZATION N/A 01/09/2015   Procedure: Left Heart Cath and Coronary Angiography;  Surgeon: [name], MD;  Location: [place] CV LAB;  Service: Cardiovascular;  Laterality: N/A;   CARDIOVERSION N/A 03/05/2021   Procedure: CARDIOVERSION;  Surgeon: Kate Sable, MD;  Location: [facility] ORS;  Service: Cardiovascular;  Laterality: N/A;   COLONOSCOPY N/A 07/22/2020   Procedure: COLONOSCOPY;  Surgeon: Toledo, Benay Pike, MD;  Location: [facility] ENDOSCOPY;  Service: Gastroenterology;  Laterality: N/A;   ELECTROPHYSIOLOGIC STUDY N/A 08/16/2014   Procedure: CARDIOVERSION;  Surgeon: [name], MD;  Location: [facility] ORS;  Service: Cardiovascular;  Laterality: N/A;   FLEXIBLE SIGMOIDOSCOPY N/A 10/10/2015   Procedure: FLEXIBLE SIGMOIDOSCOPY;  Surgeon: [name], MD;  Location: [facility] ENDOSCOPY;  Service: Endoscopy;  Laterality:  N/A;   KNEE SURGERY Right    NM MYOVIEW LTD  November 2011   No ischemia or infarction. EF 50-55% ( no improvement from 2009 Myoview EF of 28%   TEE WITHOUT CARDIOVERSION N/A 08/16/2014   Procedure: TRANSESOPHAGEAL ECHOCARDIOGRAM (TEE);  Surgeon: [name], MD;  Location: [facility] ORS;  Service: Cardiovascular;  Laterality: N/A;   TEE WITHOUT CARDIOVERSION N/A 03/05/2021   Procedure: TRANSESOPHAGEAL ECHOCARDIOGRAM (TEE);  Surgeon: Kate Sable, MD;  Location: [facility] ORS;  Service: Cardiovascular;  Laterality: N/A;   Family History  Problem Relation Age of Onset   Hypertension Mother    Hyperlipidemia Mother    Diabetes Mother    Social History   Tobacco Use   Smoking status: Former    Packs/day: 1.00    Years: 12.00    Pack years: 12.00    Types: Cigarettes    Quit date: 02/23/1998    Years since quitting: 23.1   Smokeless tobacco: Never  Substance Use Topics   Alcohol use: Not Currently    Alcohol/week: 0.0 standard drinks    Comment: Past heavy drinker   Allergies  Allergen Reactions   Esomeprazole Magnesium Other (See Comments)    Suspected interstitial nephritis 2018   Eggs Or Egg-Derived Products Rash   Prior to Admission medications   Medication Sig Start Date End Date Taking? Authorizing Provider  apixaban (ELIQUIS) 5 MG TABS tablet Take 1 tablet (5 mg total) by mouth 2 (two) times daily. 10/08/20  Yes [name], MD  atorvastatin (LIPITOR) 80 MG tablet Take 1 tablet (80 mg total) by mouth daily. 10/08/20  Yes Visser, Jacquelyn D, PA-C  ferrous gluconate (FERGON) 324 MG tablet Take 324 mg by mouth daily with breakfast.   Yes [provider]  gabapentin (NEURONTIN) 300 MG capsule Take 300 mg by mouth 2 (two) times daily. 01/23/21  Yes [provider]  levothyroxine (SYNTHROID) 100 MCG tablet Take 100 mcg by mouth daily before breakfast.   Yes [provider]  metoprolol succinate (TOPROL-XL) 25 MG 24 hr tablet Take 0.5 tablets (12.5  mg total) by mouth daily. 03/07/21 04/06/21 Yes Wyvonnia Dusky, MD  dapagliflozin propanediol (FARXIGA) 10 MG TABS tablet Take 1 tablet (10 mg total) by mouth daily before breakfast. Hold this medication until you see your cardiologist and/or nephrologist Patient not taking: Reported on 03/13/2021 03/06/21   Wyvonnia Dusky, MD  furosemide (LASIX) 40 MG tablet Take 1 tablet (40 mg total) by mouth daily.  03/06/21 07/04/21  Wyvonnia Dusky, MD   Review of Systems  Constitutional:  Positive for fatigue. Negative for appetite change.  HENT:  Negative for congestion, postnasal drip and sore throat.   Eyes: Negative.   Respiratory:  Positive for shortness of breath. Negative for cough.   Cardiovascular:  Positive for leg swelling. Negative for chest pain and palpitations.  Gastrointestinal:  Positive for abdominal distention. Negative for abdominal pain.  Endocrine: Negative.   Genitourinary: Negative.   Musculoskeletal:  Positive for arthralgias (left hand) and back pain. Negative for neck pain.  Skin: Negative.   Allergic/Immunologic: Negative.   Neurological:  Positive for numbness (down left arm (pinched nerve)). Negative for dizziness, weakness and light-headedness.  Hematological:  Negative for adenopathy. Does not bruise/bleed easily.  Psychiatric/Behavioral:  Negative for dysphoric mood and sleep disturbance (not wearing CPAP due to supply issue; sleeping on 1 pillow). The patient is not nervous/anxious.    Vitals:   04/21/21 1254 04/21/21 1319  BP: (!) 154/114 (!) 158/100  Pulse: 70   Resp: 20   SpO2: 98%   Weight: 268 lb 4 oz (121.7 kg)   Height: 5\' 11"  (1.803 m)    Wt Readings from Last 3 Encounters:  04/21/21 268 lb 4 oz (121.7 kg)  04/03/21 245 lb (111.1 kg)  03/25/21 264 lb (119.7 kg)   Lab Results  Component Value Date   CREATININE 2.27 (H) 04/03/2021   CREATININE 2.51 (H) 04/01/2021   CREATININE 2.67 (H) 03/13/2021    Physical Exam Vitals and nursing note  reviewed.  Constitutional:      General: Hector [name] is not in acute distress.    Appearance: Normal appearance.  HENT:     Head: Normocephalic and atraumatic.  Neck:     Vascular: No JVD.  Cardiovascular:     Rate and Rhythm: Normal rate and regular rhythm.  Pulmonary:     Effort: Pulmonary effort is normal. No respiratory distress.     Breath sounds: No wheezing or rales.  Abdominal:     General: There is distension.     Palpations: Abdomen is soft.  Musculoskeletal:     Cervical back: Normal range of motion and neck supple.     Right lower leg: No edema.     Left lower leg: No edema.  Skin:    General: Skin is warm and dry.  Neurological:     General: No focal deficit present.     Mental Status: Hector [name] is alert and oriented to person, place, and time.     Motor: Weakness present.  Psychiatric:        Mood and Affect: Mood normal.        Behavior: Behavior normal.        Thought Content: Thought content normal.     Assessment & Plan:  1: Acute on Chronic heart failure with reduced ejection fraction- - NYHA Class II - volume overloaded today - not weighing daily as Hector [name] needs batteries for the scales; reminded to call for an overnight weight gain of >2 pounds or a weekly weight gain of >5 pounds - weight up to 272, baseline 264 - has been taking Hector [name] lasix dose PRN for "leg swelling", although is supposed to take 40 mg QD; instructed to take it daily - on GDMT of metoprolol and farxiga (per [facility]) - poor historian and did not bring Hector [name] medication bottles, attempted to review verbally and Hector [name] is not sure what Hector [name] takes just says "I take everything I have at home" - reiterated importance of bringing meds each time and to Hector [name] PCP as well - not adding salt to Hector [name] food, but ate a burger yesterday and a deli sandwich today  - saw cardiology Kathlen Mody) 03/25/21 - BNP 02/28/21 was 1449.1  2: HTN- - BP elevated today 158/100  - follows with PCP at [facility], will return on  04/24/21 - BMP from 04/03/21 reviewed and showed sodium 141, potassium 4.7, creatinine 2.27 and GFR 30 -  saw nephrology Karl Pock) 02/13/21; instructed him to call their office about being seen to help aid in diuretic usage with Hector [name] renal function, unclear if Hector [name] has followed up with them yet or not  3: Obstructive sleep apnea- - sleeping well at night  - says that Hector [name] needs new CPAP machine; advised him to call the company to follow-up as Hector [name] was told there was a back-order - Hector [name] untreated sleep apnea is certainly going to affect Hector [name] heart and fluid retention   Patient did not bring Hector [name] medications nor a list. Each medication was verbally reviewed with the patient and Hector [name] was encouraged to bring the bottles to every visit to confirm accuracy of list.   Return in 1 month, or as needed

## 2021-04-28 ENCOUNTER — Other Ambulatory Visit: Payer: Self-pay | Admitting: Neurology

## 2021-04-28 DIAGNOSIS — R9089 Other abnormal findings on diagnostic imaging of central nervous system: Secondary | ICD-10-CM

## 2021-04-29 ENCOUNTER — Other Ambulatory Visit (HOSPITAL_COMMUNITY): Payer: Self-pay

## 2021-04-30 ENCOUNTER — Other Ambulatory Visit: Payer: Self-pay

## 2021-04-30 ENCOUNTER — Ambulatory Visit (INDEPENDENT_AMBULATORY_CARE_PROVIDER_SITE_OTHER): Payer: Medicaid Other | Admitting: Cardiology

## 2021-04-30 ENCOUNTER — Encounter: Payer: Self-pay | Admitting: Cardiology

## 2021-04-30 ENCOUNTER — Encounter (HOSPITAL_COMMUNITY): Payer: Self-pay

## 2021-04-30 VITALS — BP 176/116 | HR 82 | Ht 71.0 in | Wt 276.0 lb

## 2021-04-30 DIAGNOSIS — I428 Other cardiomyopathies: Secondary | ICD-10-CM | POA: Diagnosis not present

## 2021-04-30 DIAGNOSIS — I5022 Chronic systolic (congestive) heart failure: Secondary | ICD-10-CM | POA: Diagnosis not present

## 2021-04-30 DIAGNOSIS — I4892 Unspecified atrial flutter: Secondary | ICD-10-CM

## 2021-04-30 NOTE — Progress Notes (Signed)
Electrophysiology Office Note:    Date:  04/30/2021   ID:  Hector [name], DOB 19-Dec-1965, MRN [id number]  PCP:  [name], MD  [facility] Cardiologist:  Ida Rogue, MD    Referring MD: Antony Madura, PA-C   Chief Complaint: Atrial flutter  History of Present Illness:    Hector [name] is a 56 y.o. male who presents for an evaluation of atrial flutter at the request of [place], [place]. Their medical history includes nonischemic cardiomyopathy, chronic systolic heart failure, paroxysmal atrial fibrillation, diabetes, alcohol abuse, obstructive sleep apnea on CPAP, CKD 3.  The patient saw [facility] on March 25, 2021.  His chronic systolic heart failure dates back greater than 10 years.  He has a prior heart catheterization which showed nonobstructive coronary artery disease.  He was admitted in July 2022 with acute heart failure.  His EF at that time was 25%.  He also has history of GI bleed while taking Eliquis.  This was thought to be secondary to diverticulitis and internal hemorrhoids.  He was previously on amiodarone for his atrial arrhythmias but this was stopped secondary to thyroid disease.  Because of abnormal renal function he is not a good candidate for digoxin or dofetilide.  Today he tells me at the beginning of today's visit, he told me that he had been out of his medications but then later told me that he had been taking them uninterrupted.  Later he tells me that he has been taking an old supply of medications that are "not out of date".  He is not able to tell me his medications without any prompting him.  He clearly is volume up on exam.  When I review his weight trends he has gained at least 35 pounds in the last few months. When asked him if he weighs himself every day he tells me that he just got his scales fixed.    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 12/2007 MV: EF 28%, no  isch;  b. 8/12 Echo: EF 25-35%; c. 02/2014 Echo: EF 20-25%; d. 12/2014 Cath: minimal CAD; e. 01/2015 Echo: EF 50-55%;  d. 05/2016 Echo: EF 30-35%, diff HK, gr2 DD; e. 11/2016 Echo: EF 45-50%, diff HK.   Chronic combined systolic (congestive) and diastolic (congestive) heart failure ([place])    a. 05/2016 Most recent Echo: EF 30-35%, diff HK, Gr2 DD, mild MR, mod dil LA; b. 11/2016 Echo: EF 45-50%, diff HK.   CKD (chronic kidney disease), stage III (HCC)    Elevated troponin    Chronically elevated. - level was 0.17-0.10 during recent admission   Essential hypertension    GI bleed 11/2013   Hyperlipidemia    Pancreatitis    Paroxysmal A-fib (HCC)    a. new onset s/p unsuccessful TEE/DCCV on 08/16/2014; b. on amio/eliquis (CHA2DS2VASc = 2-3); c. reports 1-2 hrs of afib ~ q12mos.   Paroxysmal atrial flutter ([place])    a. new onset 07/2014; b. s/p unsuccessful TEE/DCCV 08/16/2014; c. on apixaban.   Sleep-disordered breathing    Has yet to have a sleep study   Stroke [facility])     Past Surgical History:  Procedure Laterality Date   CARDIAC CATHETERIZATION N/A 01/09/2015   Procedure: Left Heart Cath and Coronary Angiography;  Surgeon: [name], MD;  Location: [facility];  Service: Cardiovascular;  Laterality: N/A;   CARDIOVERSION N/A 03/05/2021   Procedure:  CARDIOVERSION;  Surgeon: Kate Sable, MD;  Location: [facility] ORS;  Service: Cardiovascular;  Laterality: N/A;   COLONOSCOPY N/A 07/22/2020   Procedure: COLONOSCOPY;  Surgeon: Toledo, Benay Pike, MD;  Location: [facility] ENDOSCOPY;  Service: Gastroenterology;  Laterality: N/A;   ELECTROPHYSIOLOGIC STUDY N/A 08/16/2014   Procedure: CARDIOVERSION;  Surgeon: [name], MD;  Location: [facility] ORS;  Service: Cardiovascular;  Laterality: N/A;   FLEXIBLE SIGMOIDOSCOPY N/A 10/10/2015   Procedure: FLEXIBLE SIGMOIDOSCOPY;  Surgeon: [name], MD;  Location: [facility] ENDOSCOPY;  Service: Endoscopy;  Laterality: N/A;   KNEE SURGERY Right    NM  MYOVIEW LTD  November 2011   No ischemia or infarction. EF 50-55% ( no improvement from 2009 Myoview EF of 28%   TEE WITHOUT CARDIOVERSION N/A 08/16/2014   Procedure: TRANSESOPHAGEAL ECHOCARDIOGRAM (TEE);  Surgeon: [name], MD;  Location: [facility] ORS;  Service: Cardiovascular;  Laterality: N/A;   TEE WITHOUT CARDIOVERSION N/A 03/05/2021   Procedure: TRANSESOPHAGEAL ECHOCARDIOGRAM (TEE);  Surgeon: Kate Sable, MD;  Location: [facility] ORS;  Service: Cardiovascular;  Laterality: N/A;    Current Medications: Current Meds  Medication Sig   allopurinol (ZYLOPRIM) 100 MG tablet Take 1 tablet by mouth daily. Taking a 1/2 tablet   apixaban (ELIQUIS) 5 MG TABS tablet Take 1 tablet (5 mg total) by mouth 2 (two) times daily.   atorvastatin (LIPITOR) 80 MG tablet Take 1 tablet (80 mg total) by mouth daily.   dapagliflozin propanediol (FARXIGA) 10 MG TABS tablet Take 1 tablet (10 mg total) by mouth daily before breakfast. Hold this medication until you see your cardiologist and/or nephrologist   ferrous gluconate (FERGON) 324 MG tablet Take 324 mg by mouth daily with breakfast.   furosemide (LASIX) 40 MG tablet Take 1 tablet (40 mg total) by mouth daily.   levothyroxine (SYNTHROID) 100 MCG tablet Take 100 mcg by mouth daily before breakfast.   pregabalin (LYRICA) 100 MG capsule Take 100 mg by mouth 2 (two) times daily.     Allergies:   Esomeprazole magnesium and Eggs or egg-derived products   Social History   Socioeconomic History   Marital status: Single    Spouse name: Not on file   Number of children: Not on file   Years of education: Not on file   Highest education level: Not on file  Occupational History   Not on file  Tobacco Use   Smoking status: Former    Packs/day: 1.00    Years: 12.00    Pack years: 12.00    Types: Cigarettes    Quit date: 02/23/1998    Years since quitting: 23.1   Smokeless tobacco: Never  Vaping Use   Vaping Use: Never used  Substance and Sexual  Activity   Alcohol use: Not Currently    Alcohol/week: 0.0 standard drinks    Comment: Past heavy drinker   Drug use: No   Sexual activity: Not Currently  Other Topics Concern   Not on file  Social History Narrative   Not on file   Social Determinants of Health   Financial Resource Strain: Not on file  Food Insecurity: Not on file  Transportation Needs: Not on file  Physical Activity: Not on file  Stress: Not on file  Social Connections: Not on file     Family History: The patient's family history includes Diabetes in his mother; Hyperlipidemia in his mother; Hypertension in his mother.  ROS:   Please see the history of present illness.    All other systems  reviewed and are negative.  EKGs/Labs/Other Studies Reviewed:    The following studies were reviewed today:  03/05/2021 ECG - Atypical atrial flutter with 2:1 conduction 11/03/2016 ECG - AF 08/16/2014 ECG - AF  02/28/2021 CXR  03/05/2021 TEE - EF <20% 10/29/2020 TTE - EF 30% 09/13/2020 TTE - EF 25% 01/31/2020 TTE - EF 45% 12/03/2016 TTE - EF 45% 06/02/2016 TTE - EF30% 02/21/2015 TTE - EF50%  EKG:  The ekg ordered today demonstrates sinus rhythm.   Recent Labs: 11/15/2020: Magnesium 1.9; TSH 12.200 02/28/2021: ALT 10; B Natriuretic Peptide 1,449.1 03/06/2021: Hemoglobin 13.4; Platelets 246 04/03/2021: BUN 30; Creatinine, Ser 2.27; Potassium 4.7; Sodium 141  Recent Lipid Panel    Component Value Date/Time   CHOL 89 02/03/2020 0602   CHOL 249 (H) 10/11/2018 1226   CHOL 118 03/08/2014 0410   TRIG 125 02/03/2020 0602   TRIG 95 03/08/2014 0410   HDL 15 (L) 02/03/2020 0602   HDL 36 (L) 10/11/2018 1226   HDL 28 (L) 03/08/2014 0410   CHOLHDL 5.9 02/03/2020 0602   VLDL 25 02/03/2020 0602   VLDL 19 03/08/2014 0410   LDLCALC 49 02/03/2020 0602   LDLCALC 174 (H) 10/11/2018 1226   LDLCALC 71 03/08/2014 0410    Physical Exam:    VS:  BP (!) 176/116 (BP Location: Left Arm, Patient Position: Sitting, Cuff Size: Normal)     Pulse 82    Ht 5\' 11"  (1.803 m)    Wt 276 lb (125.2 kg)    SpO2 93%    BMI 38.49 kg/m     Wt Readings from Last 3 Encounters:  04/30/21 276 lb (125.2 kg)  04/30/21 279 lb 15.8 oz (127 kg)  04/21/21 268 lb 4 oz (121.7 kg)     GEN: No distress.  Obese.  Sitting at 90 degrees. HEENT: Normal NECK: Elevated JVD; No carotid bruits LYMPHATICS: No lymphadenopathy CARDIAC: RRR, no murmurs, rubs, gallops RESPIRATORY:  Clear to auscultation without rales, wheezing or rhonchi  ABDOMEN: Soft, non-tender, non-distended MUSCULOSKELETAL: 2+ bilateral lower extremity pitting edema to the knees; No deformity  SKIN: Warm and dry NEUROLOGIC:  Alert and oriented x 3 PSYCHIATRIC:  Normal affect       ASSESSMENT:    1. Paroxysmal atrial flutter ([place])   2. Chronic systolic heart failure ([place])   3. Nonischemic cardiomyopathy (HCC)    PLAN:    In order of problems listed above:  #Chronic systolic heart failure #Nonischemic cardiomyopathy NYHA class III.  Warm and volume overloaded on exam.  I suspect there is a large component of medication regimen nonadherence contributing to his poor clinical status.  Because of his medication nonadherence, he is not a candidate for antiarrhythmic drug therapy.  He is not a candidate for any invasive EP procedure at this time (ICD or ablation).  His blood pressures are significantly elevated today but I am unable to titrate his medication regimen because I am not sure what he is actually taking.  I have asked him to please get his medications refilled and follow-up with Darylene Price in the heart failure clinic here in [place] at his upcoming appointment.  He will need close follow-up in the heart failure clinic.  If he is able to demonstrate medication regimen adherence, could revisit both ICD candidacy and his atrial arrhythmias.  I would want to reassess his ejection fraction with a cardiac MRI after 3 to 6 months of compliance with medical  therapy.  #Paroxysmal atrial fibrillation and flutter He is  prescribed Eliquis 5 mg by mouth twice daily.     Medication Adjustments/Labs and Tests Ordered: Current medicines are reviewed at length with the patient today.  Concerns regarding medicines are outlined above.  Orders Placed This Encounter  Procedures   EKG 12-Lead   No orders of the defined types were placed in this encounter.    Signed, Hilton Cork. Quentin Ore, MD, [facility], [facility] 04/30/2021 2:54 PM    Electrophysiology [facility]

## 2021-04-30 NOTE — Patient Instructions (Signed)
Medication Instructions:  ?-  Your physician recommends that you continue on your current medications as directed. Please refer to the Current Medication list given to you today. ? ? ?*If you need a refill on your cardiac medications before your next appointment, please call your pharmacy* ? ? ?Lab Work: ?- none ordered ? ?If you have labs (blood work) drawn today and your tests are completely normal, you will receive your results only by: ?MyChart Message (if you have MyChart) OR ?A paper copy in the mail ?If you have any lab test that is abnormal or we need to change your treatment, we will call you to review the results. ? ? ?Testing/Procedures: ?- none ordered ? ? ?Follow-Up: ?At [facility], you and your health needs are our priority.  As part of our continuing mission to provide you with exceptional heart care, we have created designated Provider Care Teams.  These Care Teams include your primary Cardiologist (physician) and Advanced Practice Providers (APPs -  Physician Assistants and Nurse Practitioners) who all work together to provide you with the care you need, when you need it. ? ?We recommend signing up for the patient portal called "MyChart".  Sign up information is provided on this After Visit Summary.  MyChart is used to connect with patients for Virtual Visits (Telemedicine).  Patients are able to view lab/test results, encounter notes, upcoming appointments, etc.  Non-urgent messages can be sent to your provider as well.   ?To learn more about what you can do with MyChart, go to [email or web address].   ? ?Your next appointment:   ?As needed  ? ?The format for your next appointment:   ?In Person ? ?Provider:   ?Lars Mage, MD  ? ? ?Other Instructions ?N/a ? ?

## 2021-04-30 NOTE — Progress Notes (Signed)
Had a home visit with Terald,  he states been doing good.  He states picked all his medications up except his fluid pill.  He states had some old ones.  He states will pick it up tomorrow.  He started back on Farxiga today.  Gave him batteries for his scale, will not weigh in lbs but in kg.  Went over how to convert and how much gain to look for and when to call.  He states will weight the weight down daily and take with him to his next HF clinic appt.  He denies any chest pain, headaches, dizziness or increased shortness of breath.  He stays active in the home.  Living alone at this time, his mother has been staying in a SNF.  He has everything for daily living, he states just went grocery shopping.  He states watches high sodium foods.  He is aware of up coming appts.  Contacted pharmacy and they do have his furosemide ready for him, he is aware. Will continue to visit for heart failure, diet and medication management.  ? ?Delorice Bannister ?[place] EMT-Paramedic ?[phone] ?

## 2021-05-01 NOTE — Addendum Note (Signed)
Addended by: Ronaldo Miyamoto on: 05/01/2021 01:24 PM ? ? Modules accepted: Orders ? ?

## 2021-05-06 ENCOUNTER — Emergency Department
Admission: EM | Admit: 2021-05-06 | Discharge: 2021-05-06 | Disposition: A | Discharge status: Home / Self Care | Payer: Medicaid Other | Attending: Emergency Medicine | Admitting: Emergency Medicine

## 2021-05-06 ENCOUNTER — Emergency Department: Payer: Medicaid Other

## 2021-05-06 ENCOUNTER — Other Ambulatory Visit: Payer: Self-pay

## 2021-05-06 DIAGNOSIS — R7989 Other specified abnormal findings of blood chemistry: Secondary | ICD-10-CM | POA: Insufficient documentation

## 2021-05-06 DIAGNOSIS — N390 Urinary tract infection, site not specified: Secondary | ICD-10-CM | POA: Insufficient documentation

## 2021-05-06 DIAGNOSIS — K922 Gastrointestinal hemorrhage, unspecified: Secondary | ICD-10-CM | POA: Diagnosis not present

## 2021-05-06 DIAGNOSIS — R109 Unspecified abdominal pain: Secondary | ICD-10-CM

## 2021-05-06 LAB — COMPREHENSIVE METABOLIC PANEL
ALT: 13 U/L (ref 0–44)
AST: 18 U/L (ref 15–41)
Albumin: 3.7 g/dL (ref 3.5–5.0)
Alkaline Phosphatase: 80 U/L (ref 38–126)
Anion gap: 9 (ref 5–15)
BUN: 28 mg/dL — ABNORMAL HIGH (ref 6–20)
CO2: 22 mmol/L (ref 22–32)
Calcium: 8.9 mg/dL (ref 8.9–10.3)
Chloride: 107 mmol/L (ref 98–111)
Creatinine, Ser: 2.7 mg/dL — ABNORMAL HIGH (ref 0.61–1.24)
GFR, Estimated: 27 mL/min — ABNORMAL LOW (ref >60)
Glucose, Bld: 109 mg/dL — ABNORMAL HIGH (ref 70–99)
Potassium: 4.2 mmol/L (ref 3.5–5.1)
Sodium: 138 mmol/L (ref 135–145)
Total Bilirubin: 1.7 mg/dL — ABNORMAL HIGH (ref 0.3–1.2)
Total Protein: 7.1 g/dL (ref 6.5–8.1)

## 2021-05-06 LAB — URINALYSIS, COMPLETE (UACMP) WITH MICROSCOPIC
Bilirubin Urine: NEGATIVE
Glucose, UA: 50 mg/dL — AB
Hgb urine dipstick: NEGATIVE
Ketones, ur: NEGATIVE mg/dL
Nitrite: NEGATIVE
Protein, ur: >=300 mg/dL — AB
Specific Gravity, Urine: 1.018 (ref 1.005–1.030)
pH: 6 (ref 5.0–8.0)

## 2021-05-06 LAB — CBC
HCT: 45.4 % (ref 39.0–52.0)
Hemoglobin: 13.8 g/dL (ref 13.0–17.0)
MCH: 25.4 pg — ABNORMAL LOW (ref 26.0–34.0)
MCHC: 30.4 g/dL (ref 30.0–36.0)
MCV: 83.5 fL (ref 80.0–100.0)
Platelets: 211 10*3/uL (ref 150–400)
RBC: 5.44 MIL/uL (ref 4.22–5.81)
RDW: 20.6 % — ABNORMAL HIGH (ref 11.5–15.5)
WBC: 7.5 10*3/uL (ref 4.0–10.5)
nRBC: 0 % (ref 0.0–0.2)

## 2021-05-06 IMAGING — CT CT ABD-PELV W/O CM
2 of 4 series · 16 of 46 positions shown, 18 images · non-contrast
Comparison: CT [DATE]

CLINICAL DATA: Abdominal pain, red blood per stool.



[Series 2: ap without · axial · non-contrast · 0.87mm/px · z∈[+803,+1278]mm · 13 of 109 slices shown, 15 images]
[im 7/109  soft-tissue]
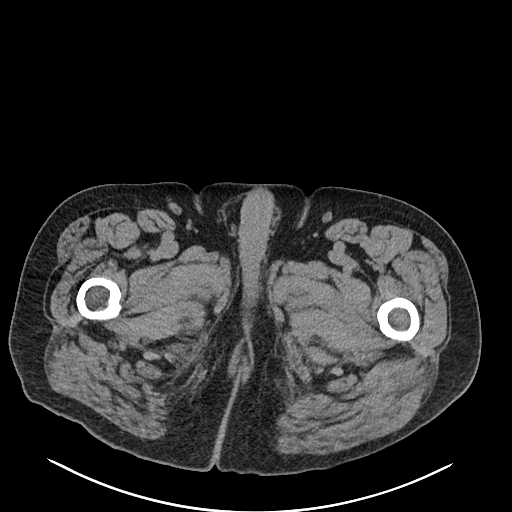
[im 7/109  bone]
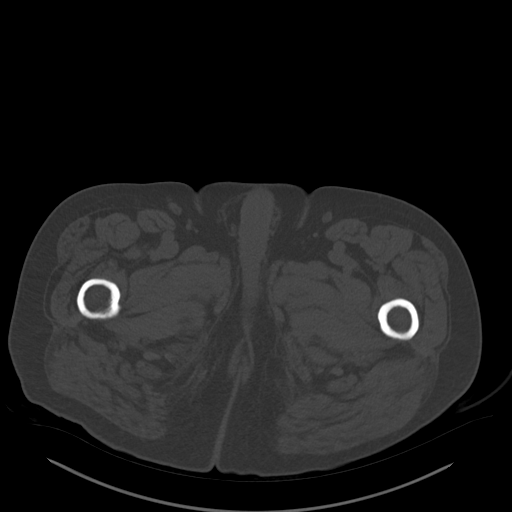
[im 13/109  soft-tissue]
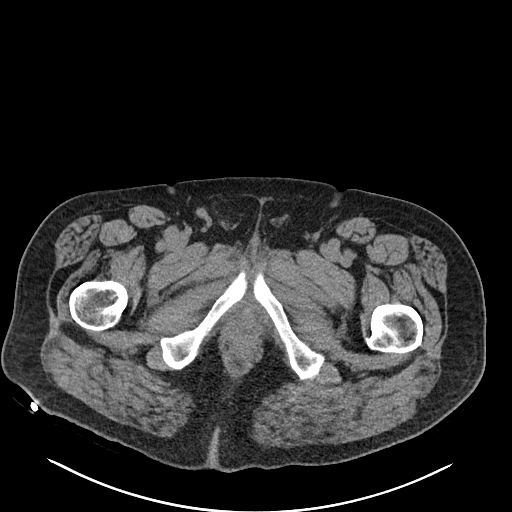
[im 26/109  soft-tissue]
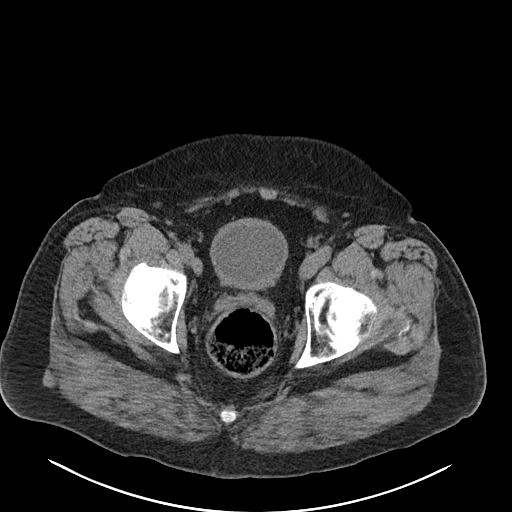
[im 32/109  soft-tissue]
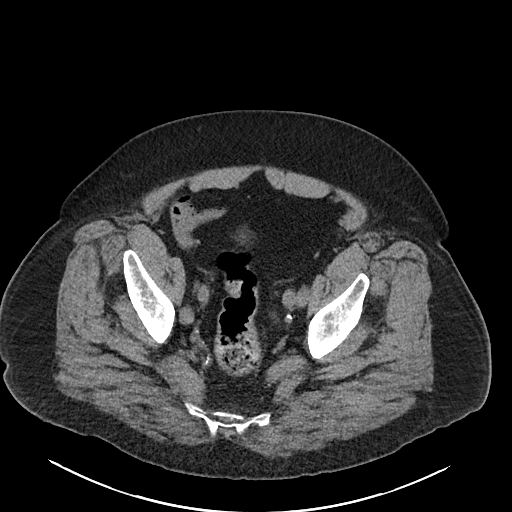
[im 39/109  soft-tissue]
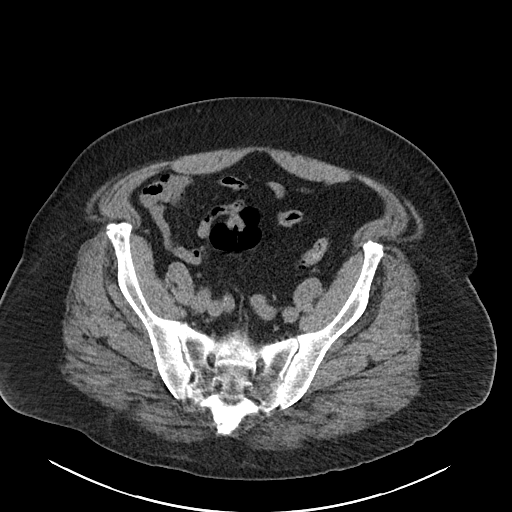
[im 45/109  soft-tissue]
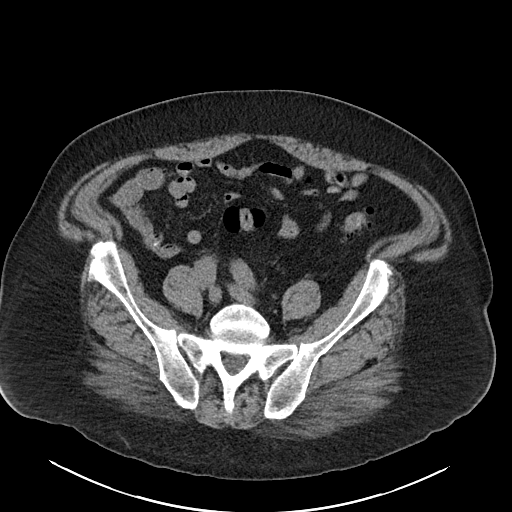
[im 58/109  soft-tissue]
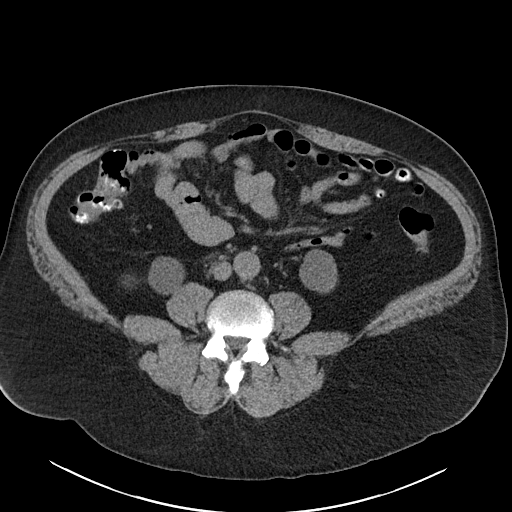
[im 64/109  soft-tissue]
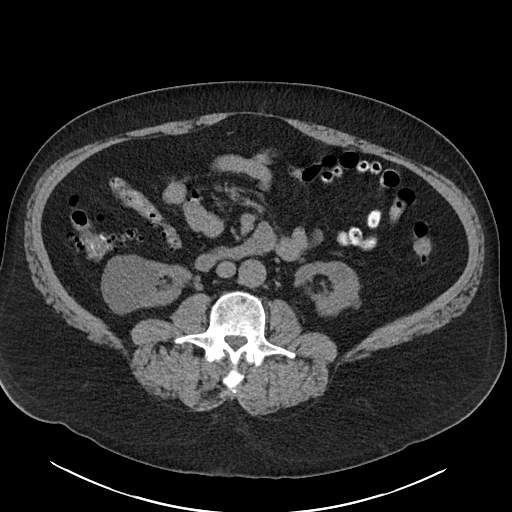
[im 70/109  soft-tissue]
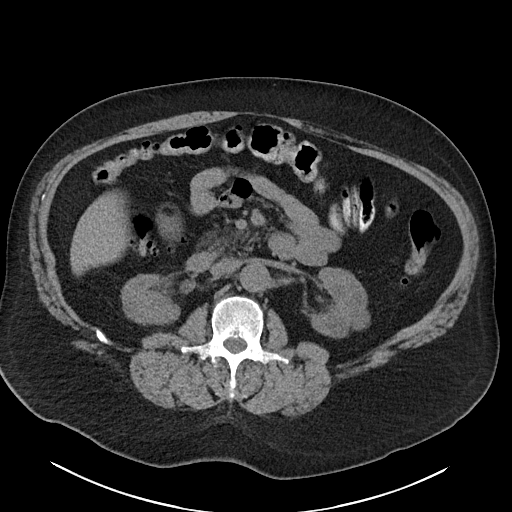
[im 70/109  bone]
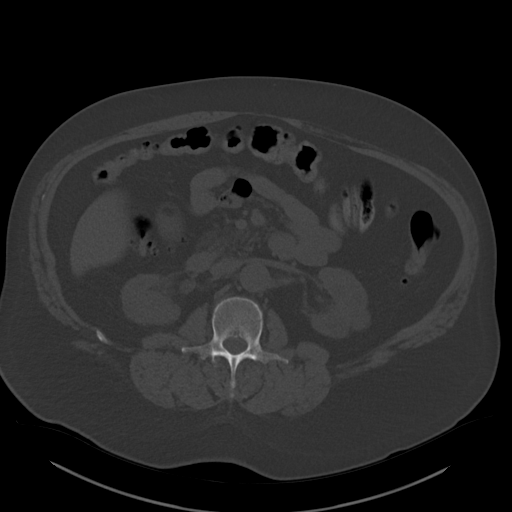
[im 77/109  soft-tissue]
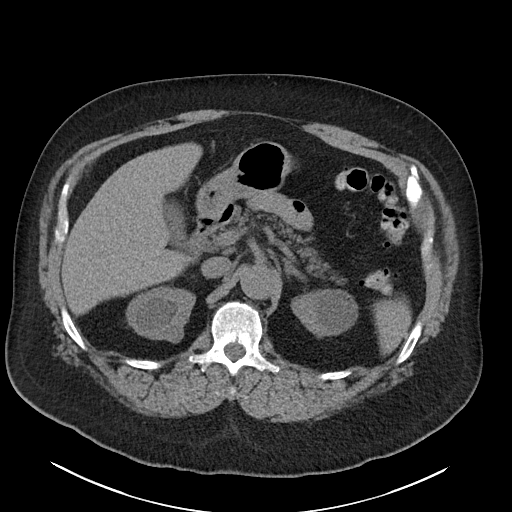
[im 83/109  soft-tissue]
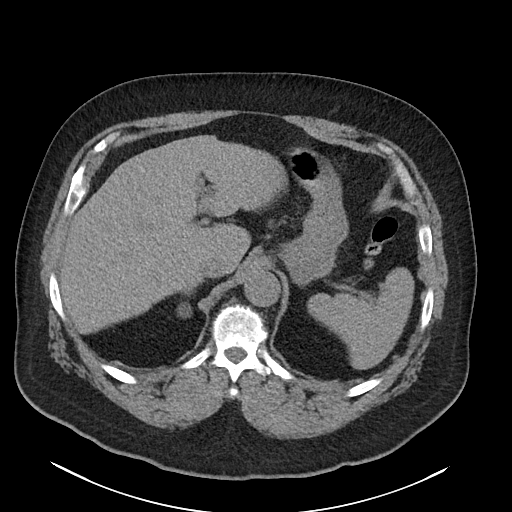
[im 96/109  soft-tissue]
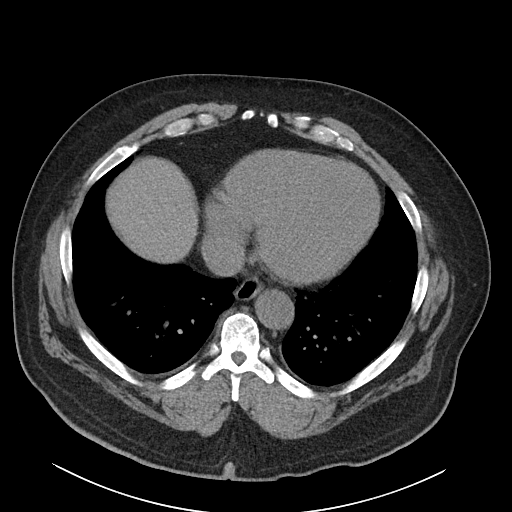
[im 102/109  soft-tissue]
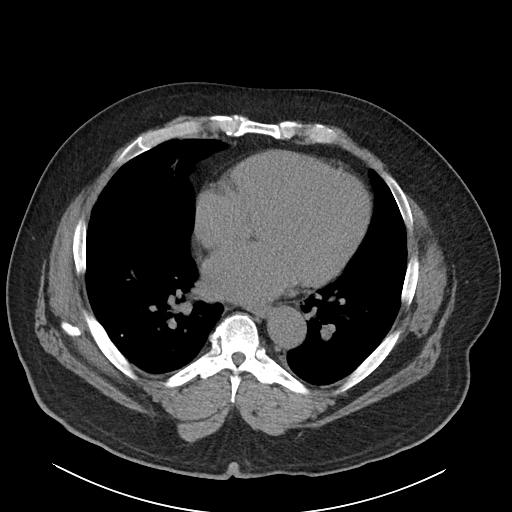

[Series 5: cor · coronal · 0.89mm/px · 3 of 113 slices shown]
[im 38/113  soft-tissue]
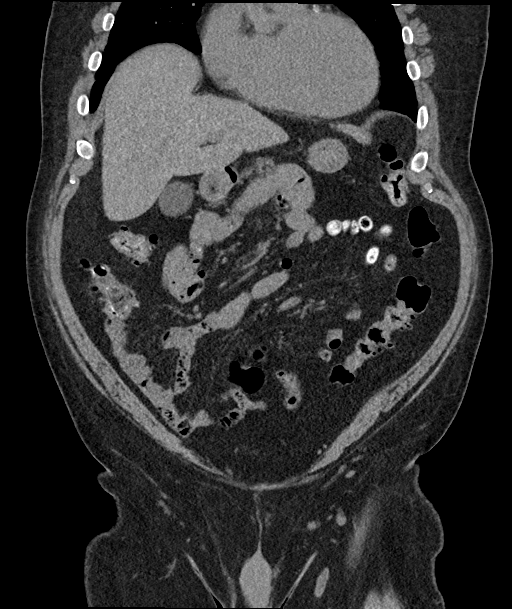
[im 50/113  soft-tissue]
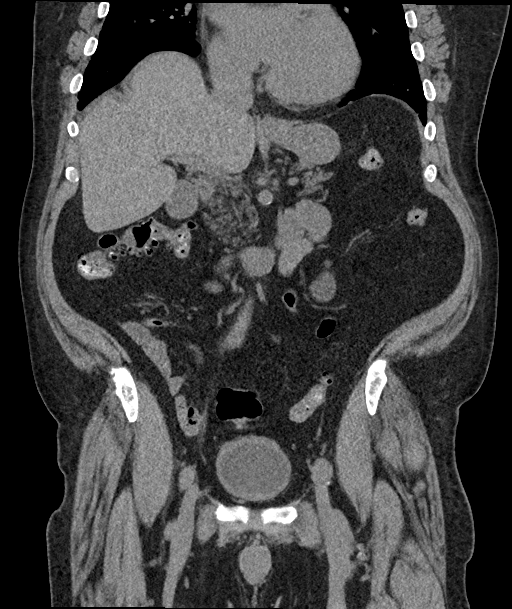
[im 63/113  soft-tissue]
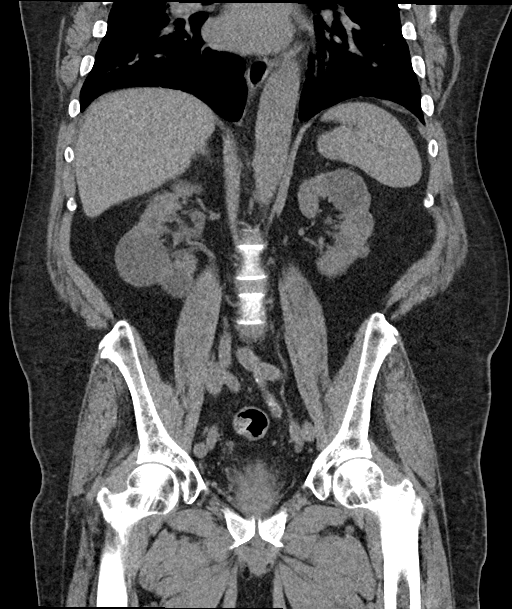

[16 of 46 positions shown; findings below may reference images not displayed]

FINDINGS: Lower chest: Right lower lobe calcified granuloma. Mosaic
attenuation lung bases with diffuse bronchial wall thickening.

Hepatobiliary: Unremarkable noncontrast appearance of the hepatic
parenchyma. Cholelithiasis without findings of acute cholecystitis.

Pancreas: No pancreatic ductal dilation or evidence of acute
inflammation.

Spleen: No splenomegaly or focal splenic lesion

Adrenals/Urinary Tract: Bilateral adrenal glands appear normal.

No hydronephrosis. Multiple bilateral cystic renal lesions,
incompletely evaluated on non contrasted CT but favored to reflect
simple cysts the largest of which measures 5.2 cm in the right lower
pole.

Mild symmetric wall thickening of an incompletely distended urinary
bladder.

Stomach/Bowel: No enteric contrast was administered. Stomach is
unremarkable for degree of distension. No pathologic dilation of
small or large bowel. The appendix and terminal ileum appear normal.
Colonic diverticulosis without findings of acute diverticulitis.

Vascular/Lymphatic: Scattered aortic and branch vessel
atherosclerosis without abdominal aortic aneurysm. No pathologically
enlarged abdominal or pelvic lymph nodes.

Reproductive: Prostate is unremarkable.

Other: No significant abdominopelvic free fluid.

Musculoskeletal: Multilevel degenerative changes spine. No acute
osseous abnormality.
IMPRESSION: 1. Colonic diverticulosis without findings of acute diverticulitis.
No evidence of acute bowel inflammation.
2. Cholelithiasis without findings of acute cholecystitis.
3. Multiple bilateral hypodense renal lesions, incompletely
evaluated on non contrasted CT but similar to prior studies and
favored to reflect simple/benign cysts.
4. Mosaic attenuation lung bases with diffuse bronchial wall
thickening, suggestive of small airways disease.
5.  Aortic Atherosclerosis ([2R]-[2R]).

## 2021-05-06 MED ORDER — DICYCLOMINE HCL 10 MG PO CAPS: 10.0000 mg | ORAL_CAPSULE | Freq: Three times a day (TID) | ORAL | 0 refills | Status: DC | PRN

## 2021-05-06 MED ORDER — HALOPERIDOL LACTATE 5 MG/ML IJ SOLN
2.0000 mg | Freq: Once | INTRAMUSCULAR | Status: AC
Start: 2021-05-06 — End: 2021-05-06
  Administered 2021-05-06: 2 mg via INTRAVENOUS
  Filled 2021-05-06: qty 1

## 2021-05-06 MED ORDER — SODIUM CHLORIDE 0.9 % IV BOLUS
500.0000 mL | Freq: Once | INTRAVENOUS | Status: AC
  Administered 2021-05-06: 500 mL via INTRAVENOUS

## 2021-05-06 MED ORDER — DICYCLOMINE HCL 10 MG PO CAPS
20.0000 mg | ORAL_CAPSULE | Freq: Once | ORAL | Status: AC
  Administered 2021-05-06: 20 mg via ORAL
  Filled 2021-05-06: qty 2

## 2021-05-06 MED ORDER — CEPHALEXIN 500 MG PO CAPS: 500.0000 mg | ORAL_CAPSULE | Freq: Four times a day (QID) | ORAL | 0 refills | Status: AC

## 2021-05-06 NOTE — ED Triage Notes (Signed)
Pt comes into the ED via EMS from home with c/o red blood with stools over the weekend, bleeding stopped but is not having lower abd pain .  ? ?177/124 ?HZ12 ?97%RA ?

## 2021-05-06 NOTE — Discharge Instructions (Signed)
Please seek medical attention for any high fevers, chest pain, shortness of breath, change in behavior, persistent vomiting, bloody stool or any other new or concerning symptoms.  

## 2021-05-06 NOTE — ED Provider Notes (Signed)
? ?[facility] ?Provider Note ? ? ? Event Date/Time  ? First MD Initiated Contact with Patient 05/06/21 1221   ?  (approximate) ? ? ?History  ? ?Abdominal Pain and GI Bleeding ? ? ?HPI ? ?Hector [name] is a 56 y.o. male  who, per cardiology note dated 04/30/21 has history of atrial flutter, alcohol abuse and history of GI bleed while on eliquis thought secondary to diverticulitis and internal hemoorhoids who presents to the emergency department today because of concern for abdominal pain and GI bleeding.  The patient states that the pain started a couple of days ago.  Located in the lower center part of his stomach.  He describes it as a stabbing type of pain.  He has not noticed any change with defecation.  He did notice some episodes of bloody stool with this.  Denies any fevers.  Denies any recent unusual ingestions. ? ?Physical Exam  ? ?Triage Vital Signs: ?ED Triage Vitals  ?Enc Vitals Group  ?   BP 05/06/21 1126 (!) 187/127  ?   Pulse Rate 05/06/21 1126 79  ?   Resp 05/06/21 1126 20  ?   Temp 05/06/21 1126 98.7 ?F (37.1 ?C)  ?   Temp Source 05/06/21 1126 Oral  ?   SpO2 05/06/21 1126 96 %  ?   Weight 05/06/21 1127 273 lb (123.8 kg)  ?   Height 05/06/21 1127 5\' 11"  (1.803 m)  ?   Head Circumference --   ?   Peak Flow --   ?   Pain Score 05/06/21 1127 8  ? ?Most recent vital signs: ?Vitals:  ? 05/06/21 1126  ?BP: (!) 187/127  ?Pulse: 79  ?Resp: 20  ?Temp: 98.7 ?F (37.1 ?C)  ?SpO2: 96%  ? ? ?General: Awake, no distress.  ?CV:  Good peripheral perfusion.  ?Resp:  Normal effort.  ?Abd:  No distention. Minimally tender in the suprapubic region. ? ? ? ?ED Results / Procedures / Treatments  ? ?Labs ?(all labs ordered are listed, but only abnormal results are displayed) ?Labs Reviewed  ?COMPREHENSIVE METABOLIC PANEL - Abnormal; Notable for the following components:  ?    Result Value  ? Glucose, Bld 109 (*)   ? BUN 28 (*)   ? Creatinine, Ser 2.70 (*)   ? Total Bilirubin 1.7 (*)   ? GFR,  Estimated 27 (*)   ? All other components within normal limits  ?CBC - Abnormal; Notable for the following components:  ? MCH 25.4 (*)   ? RDW 20.6 (*)   ? All other components within normal limits  ?POC OCCULT BLOOD, ED  ?TYPE AND SCREEN  ? ? ? ?EKG ? ?None ? ? ?RADIOLOGY ?I independently interpreted and visualized the ct abd/pel. My interpretation: No free air. ?Radiology interpretation:  ?IMPRESSION:  ?1. Colonic diverticulosis without findings of acute diverticulitis.  ?No evidence of acute bowel inflammation.  ?2. Cholelithiasis without findings of acute cholecystitis.  ?3. Multiple bilateral hypodense renal lesions, incompletely  ?evaluated on non contrasted CT but similar to prior studies and  ?favored to reflect simple/benign cysts.  ?4. Mosaic attenuation lung bases with diffuse bronchial wall  ?thickening, suggestive of small airways disease.  ?5.  Aortic Atherosclerosis (ICD10-I70.0).  ? ? ? ?PROCEDURES: ? ?Critical Care performed: No ? ?Procedures ? ? ?MEDICATIONS ORDERED IN ED: ?Medications - No data to display ? ? ?IMPRESSION / MDM / ASSESSMENT AND PLAN / ED COURSE  ?I reviewed the triage vital signs  and the nursing notes. ?             ?               ? ?Differential diagnosis includes, but is not limited to, diverticulitis, appendicitis, gastroenteritis, UTI. ? ?Patient presented to the emergency department today with primary concern for abdominal pain.  Located in the suprapubic region.  Patient also complained of some bleeding although states that has improved.  Patient does have history of diverticulitis as well as internal hemorrhoids.  A CT scan was obtained which did not show any findings consistent with diverticulitis or other concerning intra-abdominal pathology at this time.  Blood work without any concerning leukocytosis or anemia.  Urine does show some signs of urinary tract infection and I do wonder if this is causing some of the patient's suprapubic discomfort.  I discussed this finding  with the patient.  Given reassuring CT scan do not feel patient necessitates inpatient admission at this time.  Will plan on discharging with antibiotics for UTI. ? ? ?FINAL CLINICAL IMPRESSION(S) / ED DIAGNOSES  ? ?Final diagnoses:  ?Lower urinary tract infectious disease  ?Abdominal pain, unspecified abdominal location  ? ? ? ?Note:  This document was prepared using Dragon voice recognition software and may include unintentional dictation errors. ? ?  ?Nance Pear, MD ?05/06/21 1531 ? ?

## 2021-05-07 ENCOUNTER — Ambulatory Visit
Admission: RE | Admit: 2021-05-07 | Discharge: 2021-05-07 | Disposition: A | Discharge status: Home / Self Care | Payer: Medicaid Other | Source: Ambulatory Visit | Attending: Neurology | Admitting: Neurology

## 2021-05-07 DIAGNOSIS — R9089 Other abnormal findings on diagnostic imaging of central nervous system: Secondary | ICD-10-CM | POA: Diagnosis not present

## 2021-05-07 IMAGING — MR MR HEAD WO/W CM
14 series · 48 of 48 positions shown · IV contrast (10ml Gadavist)
Comparison: [DATE]

CLINICAL DATA: Abnormal MRI, follow-up

EXAM:
MRI HEAD WITHOUT AND WITH CONTRAST
TECHNIQUE: Multiplanar, multiecho pulse sequences of the brain and surrounding
structures were obtained without and with intravenous contrast.
CONTRAST:  10mL GADAVIST GADOBUTROL 1 MMOL/ML IV SOLN

[Series 5: ax dwi_tracew · axial · 3.0mm · 0.65mm/px · z∈[-91,+64]mm · 4 of 48 slices shown]
[im 1/48]
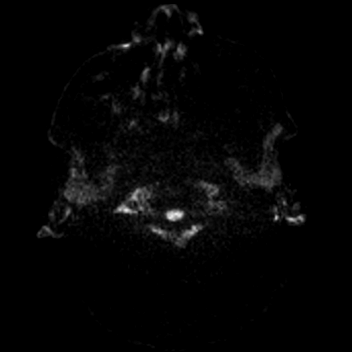
[im 16/48]
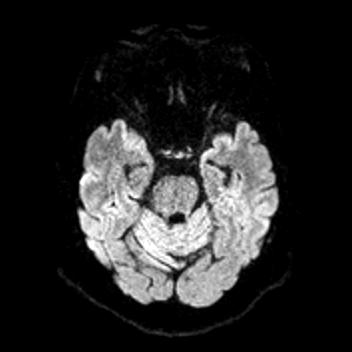
[im 32/48]
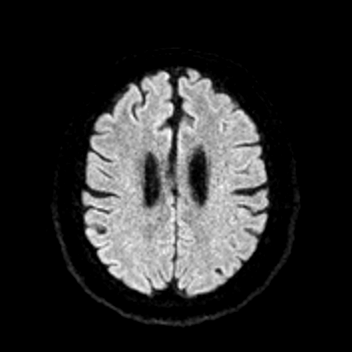
[im 48/48]
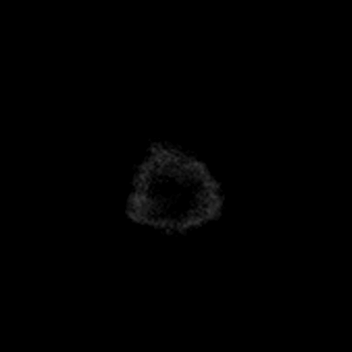

[Series 6: ax dwi_adc · axial · 3.0mm · 0.65mm/px · z∈[-91,+64]mm · 3 of 48 slices shown]
[im 1/48]
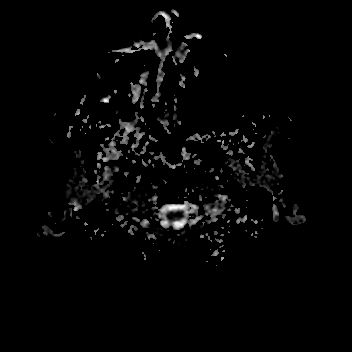
[im 24/48]
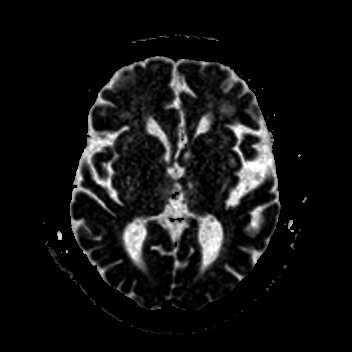
[im 48/48]
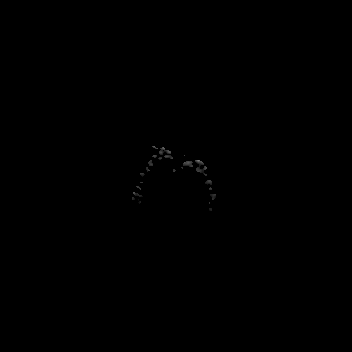

[Series 7: cor dwi_tracew · coronal · 5.0mm · 0.65mm/px · 2 of 40 slices shown]
[im 1/40]
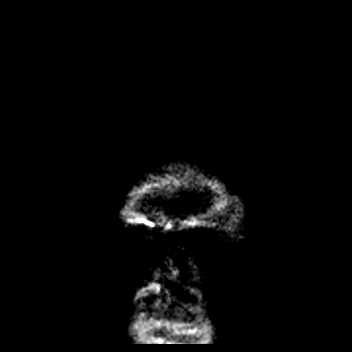
[im 40/40]
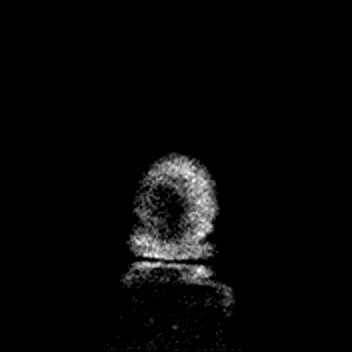

[Series 8: cor dwi_adc · coronal · 5.0mm · 0.65mm/px · 2 of 40 slices shown]
[im 1/40]
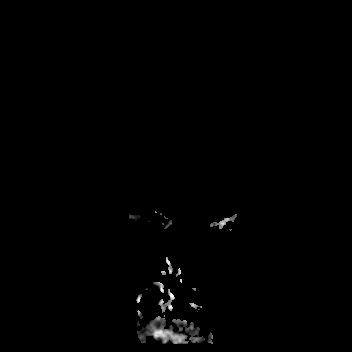
[im 40/40]
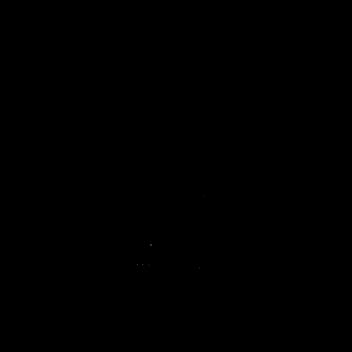

[Series 9: T1 · sagittal · 5.0mm · 0.62mm/px · 1 of 25 slices shown (1 of 2)]
[im 1/25]
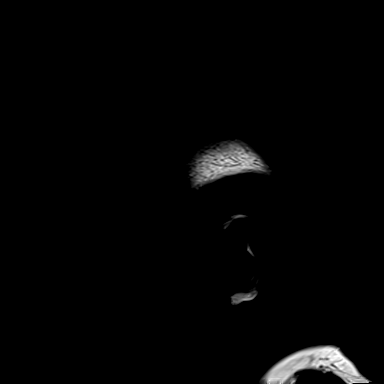

[Series 10: T2 · axial · 5.0mm · 0.53mm/px · z∈[-89,+66]mm · 2 of 27 slices shown]
[im 1/27]
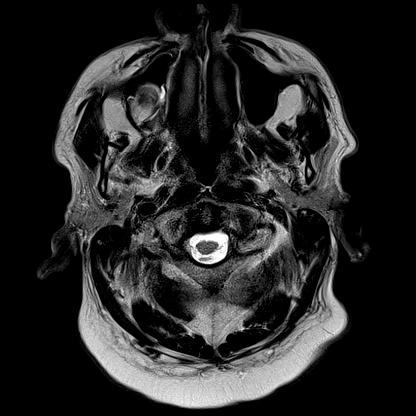
[im 27/27]
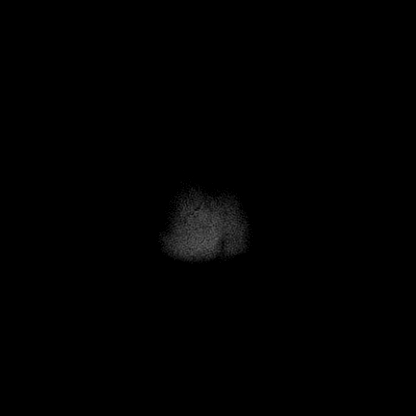

[Series 12: pha_images · axial · 3.0mm · 0.90mm/px · z∈[-91,+71]mm · 3 of 55 slices shown]
[im 1/55]
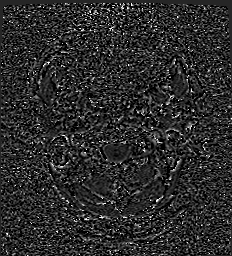
[im 28/55]
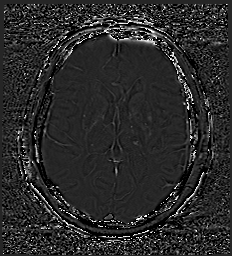
[im 55/55]
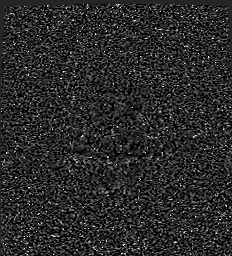

[Series 13: swi_images · axial · 3.0mm · 0.90mm/px · z∈[-94,+71]mm · 3 of 56 slices shown]
[im 1/56]
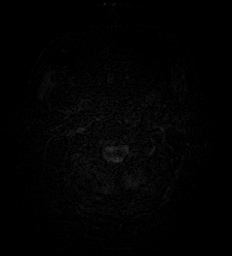
[im 28/56]
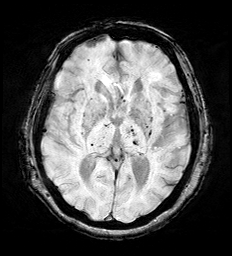
[im 56/56]
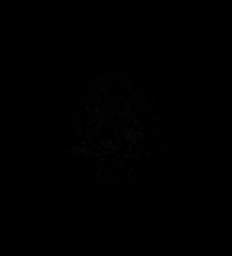

[Series 15: FLAIR · axial · 3.0mm · 0.53mm/px · z∈[-92,+69]mm · 3 of 55 slices shown]
[im 1/55]
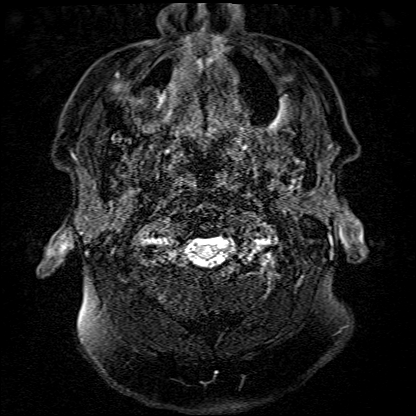
[im 28/55]
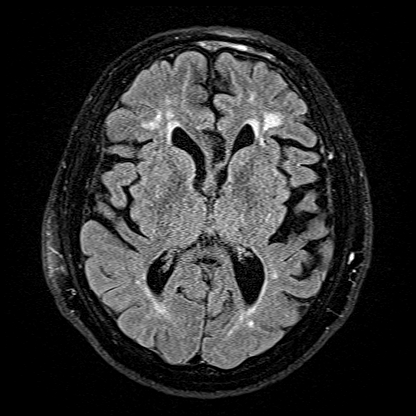
[im 55/55]
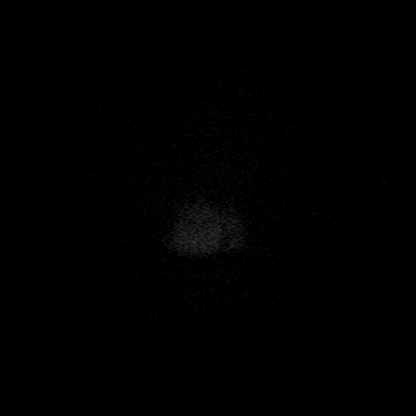

[Series 16: T1 · axial · 1.0mm · 0.98mm/px · z∈[-101,+74]mm · 10 of 176 slices shown (2 of 2)]
[im 1/176]
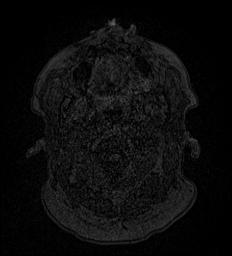
[im 20/176]
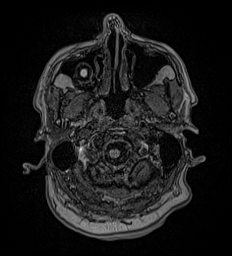
[im 39/176]
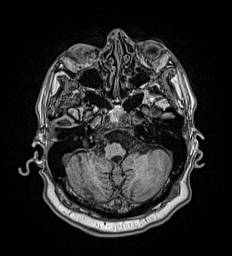
[im 59/176]
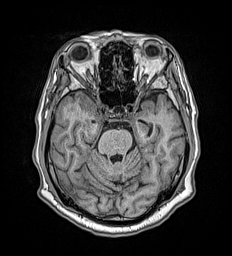
[im 78/176]
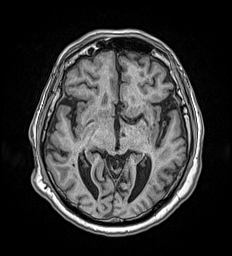
[im 98/176]
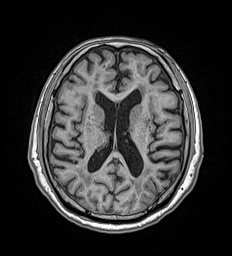
[im 117/176]
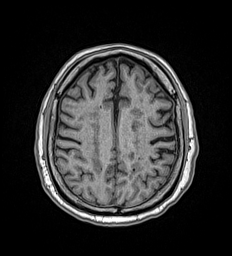
[im 137/176]
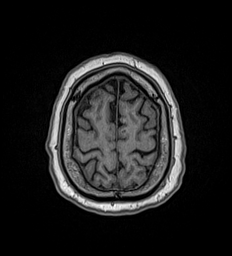
[im 156/176]
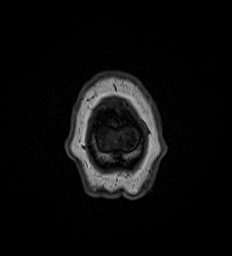
[im 176/176]
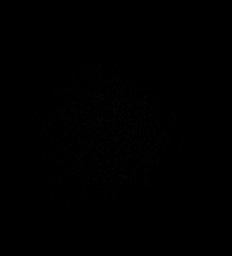

[Series 17: T2 post-contrast · coronal · 5.0mm · 0.57mm/px · 2 of 31 slices shown]
[im 1/31]
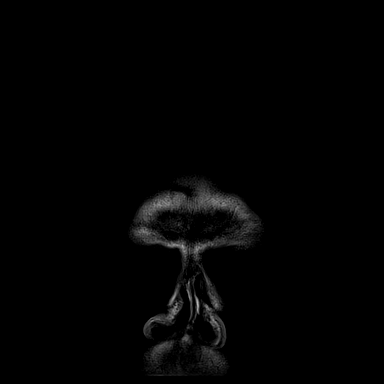
[im 31/31]
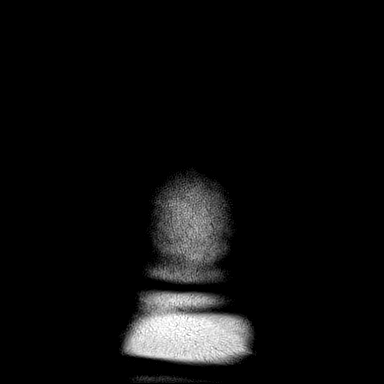

[Series 18: T1 post-contrast · axial · 1.0mm · 0.98mm/px · z∈[-101,+74]mm · 10 of 176 slices shown (1 of 3)]
[im 1/176]
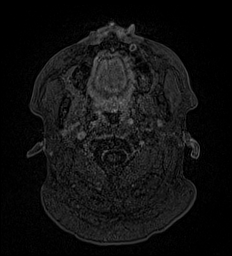
[im 20/176]
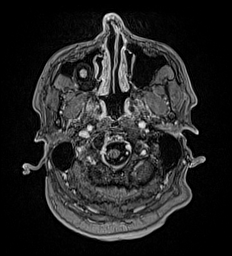
[im 39/176]
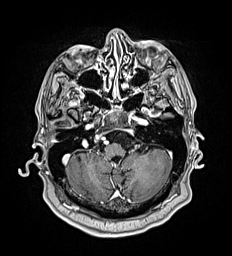
[im 59/176]
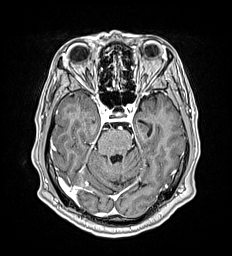
[im 78/176]
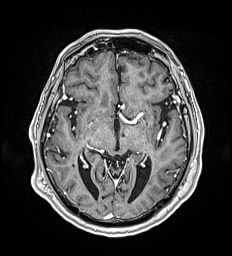
[im 98/176]
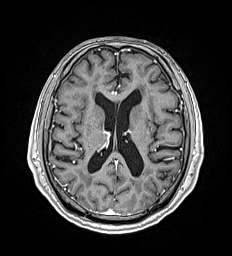
[im 117/176]
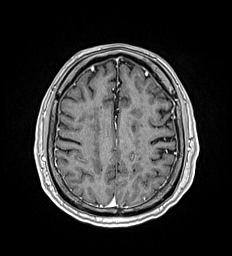
[im 137/176]
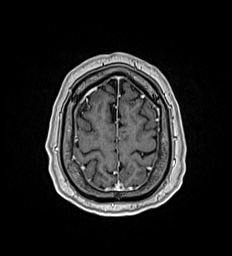
[im 156/176]
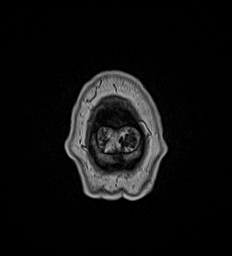
[im 176/176]
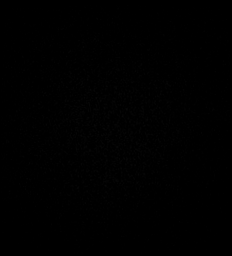

[Series 19: T1 post-contrast · coronal · 5.0mm · 0.57mm/px · 2 of 31 slices shown (2 of 3)]
[im 1/31]
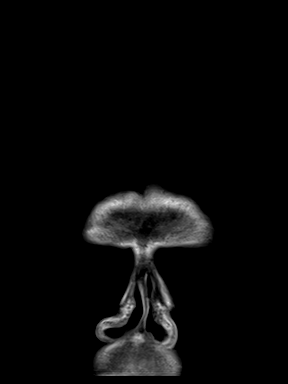
[im 31/31]
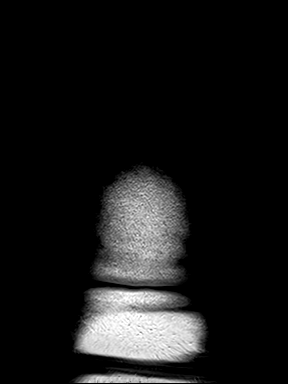

[Series 20: T1 post-contrast · sagittal · 5.0mm · 0.62mm/px · 1 of 25 slices shown (3 of 3)]
[im 1/25]
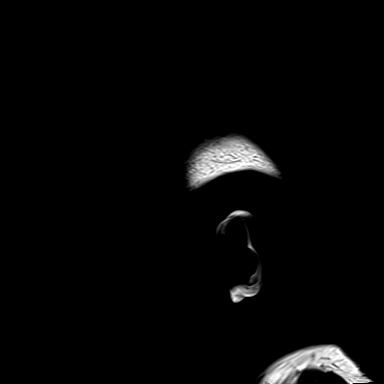

[48 of 48 positions shown; findings below may reference images not displayed]

FINDINGS: Brain: There is no acute infarction or intracranial hemorrhage.
There is no intracranial mass, mass effect, or edema. There is no
hydrocephalus or extra-axial fluid collection.

Left parietal cortical/subcortical enhancement has resolved. T2
FLAIR hyperintensity at this location is decreased.

Prominence of the ventricles and sulci reflects stable parenchymal
volume loss. Patchy and confluent areas of T2 hyperintensity in the
supratentorial white matter are nonspecific but probably reflect
stable moderate chronic microvascular ischemic changes. Multiple
foci of susceptibility are again identified in the pattern
suggesting chronic microhemorrhages secondary to hypertension.

No abnormal enhancement.

Vascular: Major vessel flow voids at the skull base are preserved.

Skull and upper cervical spine: Normal marrow signal is preserved.

Sinuses/Orbits: Right maxillary sinus retention cyst or polyp.
Orbits are unremarkable.

Other: Sella is unremarkable.  Mastoid air cells are clear.
IMPRESSION: Resolution of enhancement on the prior study, which probably
reflected a subacute infarct. No new findings.

## 2021-05-07 MED ORDER — GADOBUTROL 1 MMOL/ML IV SOLN
10.0000 mL | Freq: Once | INTRAVENOUS | Status: AC | PRN
  Administered 2021-05-07: 10 mL via INTRAVENOUS

## 2021-05-08 LAB — URINE CULTURE

## 2021-05-13 ENCOUNTER — Other Ambulatory Visit: Payer: Self-pay

## 2021-05-13 ENCOUNTER — Encounter: Payer: Self-pay | Admitting: Cardiovascular Disease

## 2021-05-13 ENCOUNTER — Ambulatory Visit (INDEPENDENT_AMBULATORY_CARE_PROVIDER_SITE_OTHER): Payer: Medicaid Other | Admitting: Cardiovascular Disease

## 2021-05-13 VITALS — BP 150/122 | HR 81 | Ht 71.0 in | Wt 267.5 lb

## 2021-05-13 DIAGNOSIS — I428 Other cardiomyopathies: Secondary | ICD-10-CM | POA: Diagnosis not present

## 2021-05-13 DIAGNOSIS — G4733 Obstructive sleep apnea (adult) (pediatric): Secondary | ICD-10-CM

## 2021-05-13 DIAGNOSIS — E785 Hyperlipidemia, unspecified: Secondary | ICD-10-CM

## 2021-05-13 DIAGNOSIS — I5023 Acute on chronic systolic (congestive) heart failure: Secondary | ICD-10-CM

## 2021-05-13 DIAGNOSIS — N1832 Chronic kidney disease, stage 3b: Secondary | ICD-10-CM

## 2021-05-13 DIAGNOSIS — I4892 Unspecified atrial flutter: Secondary | ICD-10-CM | POA: Diagnosis not present

## 2021-05-13 DIAGNOSIS — I5022 Chronic systolic (congestive) heart failure: Secondary | ICD-10-CM | POA: Diagnosis not present

## 2021-05-13 DIAGNOSIS — I10 Essential (primary) hypertension: Secondary | ICD-10-CM

## 2021-05-13 DIAGNOSIS — I480 Paroxysmal atrial fibrillation: Secondary | ICD-10-CM

## 2021-05-13 MED ORDER — APIXABAN 5 MG PO TABS: 5.0000 mg | ORAL_TABLET | Freq: Two times a day (BID) | ORAL | 6 refills | Status: DC

## 2021-05-13 MED ORDER — HYDRALAZINE HCL 25 MG PO TABS: 25.0000 mg | ORAL_TABLET | Freq: Three times a day (TID) | ORAL | 3 refills | Status: DC

## 2021-05-13 MED ORDER — METOPROLOL SUCCINATE ER 25 MG PO TB24: 25.0000 mg | ORAL_TABLET | Freq: Every day | ORAL | 3 refills | Status: DC

## 2021-05-13 MED ORDER — ATORVASTATIN CALCIUM 80 MG PO TABS: 80.0000 mg | ORAL_TABLET | Freq: Every day | ORAL | 3 refills | Status: DC

## 2021-05-13 MED ORDER — FUROSEMIDE 40 MG PO TABS: 40.0000 mg | ORAL_TABLET | Freq: Every day | ORAL | 3 refills | Status: DC

## 2021-05-13 MED ORDER — DAPAGLIFLOZIN PROPANEDIOL 10 MG PO TABS: 10.0000 mg | ORAL_TABLET | Freq: Every day | ORAL | 3 refills | Status: DC

## 2021-05-13 NOTE — Progress Notes (Signed)
?  ?  ? ? ? ?Date:  05/13/2021  ? ?ID:  RAIFORD FETTERMAN, DOB 1965-06-04, MRN [id number] ? ?Patient Location:  ?[employer] ?[place] [id number]  ? ?Provider location:   ?[place], [employer] office ? ?PCP:  [name], MD  ?Cardiologist:  [facility] ? ? ?Chief Complaint  ?Patient presents with  ? 6-8 week follow up   ?  Patient c/o shortness of breath with little to no exertion and left arm numbness from the elbow to hand. Medications reviewed by the patient verbally.   ? ? ?History of Present Illness:   ? ?Hector [name] is a 56 y.o. male  past medical history of ?alcohol abuse ?nonischemic cardiomyopathy, possibly alcohol-related, cardiac cath 2016 ?nonobstructive CAD,  ?chronic combined heart failure,  ?hypertension,  ?obesity,  ?Paroxysmal atrial fibrillation.  Flutter ?Possible sleep apnea ?Ejection fraction in the 20s% dating back to 2009, confirmed by echo 2012, ?ejection fraction 45 to 50% in October 2018 ?Who presents today for follow-up of his nonischemic cardiomyopathy, acute on chronic diastolic and systolic CHF, paroxysmal atrial fibrillation ? ?Last seen in clinic October 2022 ? ?TEE Cardioversion 1/23 for flutter ?EF <20% in the setting of flutter ?Right ventricular systolic function is severely reduced. ? ?Seen by Dr Quentin Ore for flutter, 04/30/21 ?BP was elevated ?"been out of his medications " ?He is felt not to be a good candidate for ICD, ablation ? ?In the ER last week, GI bleed ?Reports no further issues ? ?"I took my  Medications today" ? ?Blood pressure 150/122 ?Denies leg edema, no abdominal distention ? ?Sedentary, sits at home, on disability ? ?Hydralazine not on his list today ? ?Lab work reviewed, creatinine 2.7 ? ?Main complaint is pain in his left forearm, nerve pain, chronic issue ? ?EKG personally reviewed by myself on todays visit ?Normal sinus rhythm rate 81 bpm PACs left axis deviation ? ?Other past medical history reviewed ?In the ER,  9/22, foot pain, atrial fib ?Off amiodarone, for elevated TSH ?Other documentation from emergency room doctor that he was not taking other medications appropriately such as carvedilol ? ?In the hospital November 2021 ?Covid 19 pneumonia, respiratory failure, ARDS ?Given remdesivir, steroids,baricitinib ?Nephrology follow-up for renal failure ?Delene Loll was held secondary to renal failure ?Is on Lasix as needed ? ?echocardiogram December 2021 ejection fraction 45 to 50% ? ?EKG Feb 05 2020: NSR ? ?In 2016 he was diagnosed with atrial flutter and was cardioverted in June 2016.  ?reverted to atrial fibrillation and was placed on amiodarone and Eliquis  ? ?Seen by electrophysiology in 2016 in the setting of LV dysfunction and  ?catheterization at that time showed nonobstructive CAD.   ? ?echo in April 2018 showed recurrent LV dysfunction with an EF of 30-35% diffuse hypokinesis in the setting of some noncompliance with medications.   ? ?Prior cardiac catheterization November 2016 ?Nonobstructive disease ? ?Past Medical History:  ?Diagnosis Date  ? Alcohol abuse   ? 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 a. 12/2007 MV: EF 28%, no isch;  b. 8/12 Echo: EF 25-35%; c. 02/2014 Echo: EF 20-25%; d. 12/2014 Cath: minimal CAD; e. 01/2015 Echo: EF 50-55%;  d. 05/2016 Echo: EF 30-35%, diff HK, gr2 DD; e. 11/2016 Echo: EF 45-50%, diff HK.  ? Chronic combined systolic (congestive) and diastolic (congestive) heart failure (HCC)   ? a. 05/2016 Most recent Echo: EF 30-35%, diff HK, Gr2 DD, mild MR, mod dil LA; b. 11/2016 Echo: EF 45-50%, diff HK.  ?  CKD (chronic kidney disease), stage III ([place])   ? Elevated troponin   ? Chronically elevated. - level was 0.17-0.10 during recent admission  ? Essential hypertension   ? GI bleed 11/2013  ? Hyperlipidemia   ? Pancreatitis   ? Paroxysmal A-fib ([place])   ? a. new onset s/p unsuccessful TEE/DCCV on 08/16/2014; b. on amio/eliquis (CHA2DS2VASc = 2-3); c. reports 1-2 hrs of afib ~ q57mos.  ? Paroxysmal  atrial flutter ([place])   ? a. new onset 07/2014; b. s/p unsuccessful TEE/DCCV 08/16/2014; c. on apixaban.  ? Sleep-disordered breathing   ? Has yet to have a sleep study  ? Stroke [facility])   ? ?Past Surgical History:  ?Procedure Laterality Date  ? CARDIAC CATHETERIZATION N/A 01/09/2015  ? Procedure: Left Heart Cath and Coronary Angiography;  Surgeon: [name], MD;  Location: [place] [facility];  Service: Cardiovascular;  Laterality: N/A;  ? CARDIOVERSION N/A 03/05/2021  ? Procedure: CARDIOVERSION;  Surgeon: Kate Sable, MD;  Location: [facility] OR[facility];  Service: Cardiovascular;  Laterality: N/A;  ? COLONOSCOPY N/A 07/22/2020  ? Procedure: COLONOSCOPY;  Surgeon: Toledo, Benay Pike, MD;  Location: [facility] ENDOSCOPY;  Service: Gastroenterology;  Laterality: N/A;  ? ELECTROPHYSIOLOGIC STUDY N/A 08/16/2014  ? Procedure: CARDIOVERSION;  Surgeon: [name], MD;  Location: [facility];  Service: Cardiovascular;  Laterality: N/A;  ? FLEXIBLE SIGMOIDOSCOPY N/A 10/10/2015  ? Procedure: FLEXIBLE SIGMOIDOSCOPY;  Surgeon: [name], MD;  Location[facility];  Service: Endoscopy;  Laterality: N/A;  ? KNEE SURGERY Right   ? NM MYOVIEW LTD  November 2011  ? No ischemia or infarction. EF 50-55% ( no improvement from 2009 Myoview EF of 28%  ? TEE WITHOUT CARDIOVERSION N/A 08/16/2014  ? Procedure: TRANSESOPHAGEAL ECHOCARDIOGRAM (TEE);  Surgeon: [name], MD;  Location: [facility];  Service: Cardiovascular;  Laterality: N/A;  ? TEE WITHOUT CARDIOVERSION N/A 03/05/2021  ? Procedure: TRANSESOPHAGEAL ECHOCARDIOGRAM (TEE);  Surgeon: Kate Sable, MD;  Location: [facility] OR[facility];  Service: Cardiovascular;  Laterality: N/A;  ?  ? ?Current Meds  ?Medication Sig  ? allopurinol (ZYLOPRIM) 100 MG tablet Take 1 tablet by mouth daily. Taking a 1/2 tablet  ? apixaban (ELIQUIS) 5 MG TABS tablet Take 1 tablet (5 mg total) by mouth 2 (two) times daily.  ? atorvastatin (LIPITOR) 80 MG tablet Take 1 tablet (80 mg total) by mouth daily.  ?  cephALEXin (KEFLEX) 500 MG capsule Take 1 capsule (500 mg total) by mouth 4 (four) times daily for 10 days.  ? dapagliflozin propanediol (FARXIGA) 10 MG TABS tablet Take 1 tablet (10 mg total) by mouth daily before breakfast. Hold this medication until you see your cardiologist and/or nephrologist  ? dicyclomine (BENTYL) 10 MG capsule Take 1 capsule (10 mg total) by mouth 3 (three) times daily as needed (abdominal pain).  ? ferrous gluconate (FERGON) 324 MG tablet Take 324 mg by mouth daily with breakfast.  ? furosemide (LASIX) 40 MG tablet Take 1 tablet (40 mg total) by mouth daily.  ? levothyroxine (SYNTHROID) 100 MCG tablet Take 100 mcg by mouth daily before breakfast.  ? metoprolol succinate (TOPROL-XL) 25 MG 24 hr tablet Take 0.5 tablets (12.5 mg total) by mouth daily.  ?  ? ?Allergies:   Esomeprazole magnesium and Eggs or egg-derived products  ? ?Social History  ? ?Tobacco Use  ? Smoking status: Former  ?  Packs/day: 1.00  ?  Years: 12.00  ?  Pack years: 12.00  ?  Types: Cigarettes  ?  Quit date: 02/23/1998  ?  Years since quitting: 23.2  ? Smokeless tobacco: Never  ?Vaping Use  ? Vaping Use: Never used  ?Substance Use Topics  ? Alcohol use: Not Currently  ?  Alcohol/week: 0.0 standard drinks  ?  Comment: Past heavy drinker  ? Drug use: No  ?  ? ?Family Hx: ?The patient's family history includes Diabetes in his mother; Hyperlipidemia in his mother; Hypertension in his mother. ? ?ROS:   ?Please see the history of present illness.    ?Review of Systems  ?Constitutional: Negative.   ?Respiratory: Negative.    ?Cardiovascular: Negative.   ?Gastrointestinal: Negative.   ?Musculoskeletal: Negative.   ?Neurological: Negative.   ?Psychiatric/Behavioral: Negative.    ?All other systems reviewed and are negative.  ? ? ?Labs/Other Tests and Data Reviewed:   ? ?Recent Labs: ?11/15/2020: Magnesium 1.9; TSH 12.200 ?02/28/2021: B Natriuretic Peptide 1,449.1 ?05/06/2021: ALT 13; BUN 28; Creatinine, Ser 2.70; Hemoglobin 13.8;  Platelets 211; Potassium 4.2; Sodium 138  ? ?Recent Lipid Panel ?Lab Results  ?Component Value Date/Time  ? CHOL 89 02/03/2020 06:02 AM  ? CHOL 249 (H) 10/11/2018 12:26 PM  ? CHOL 118 03/08/2014 04:10 AM  ? TRIG

## 2021-05-13 NOTE — Patient Instructions (Addendum)
Medication Instructions:  ?Please increase the metoprolol succinate up to 25 mg daily ?Please restart hydralazine 25 mg three times a day ? ?If you need a refill on your cardiac medications before your next appointment, please call your pharmacy.  ? ?Lab work: ?No new labs needed ? ?Testing/Procedures: ?No new testing needed ? ?Follow-Up: ?At [facility], you and your health needs are our priority.  As part of our continuing mission to provide you with exceptional heart care, we have created designated Provider Care Teams.  These Care Teams include your primary Cardiologist (physician) and Advanced Practice Providers (APPs -  Physician Assistants and Nurse Practitioners) who all work together to provide you with the care you need, when you need it. ? ?You will need a follow up appointment in 6 months ? ?Providers on your designated Care Team:   ?Murray Hodgkins, NP ?Christell Faith, PA-C ?Cadence Kathlen Mody, PA-C ? ?COVID-19 Vaccine Information can be found at: [email or web address] For questions related to vaccine distribution or appointments, please email [email or web address] or call [phone].  ? ?

## 2021-05-20 ENCOUNTER — Ambulatory Visit: Payer: Medicaid Other | Admitting: Family

## 2021-05-22 NOTE — Progress Notes (Signed)
? [name], male    DOB: 1965/05/16, 56 y.o.   MRN: [id number] ? ? ?Hector [name] is a 56 y/o male with a history of MI, hyperlipidemia, HTN, CKD, atrial fibrillation, alcohol use, cardiomyopathy, and CHF.  ? ?TEE report from 03/05/21 reviewed and showed an EF of <20% along with mild/moderate Hector but no thrombus. Echo report from 09/13/20 reviewed showed an EF of 25-30% along with moderate Hector. Echo done 12/03/16 showed an EF of 45-50% which is an improvement from previous echo which was done on 06/02/16 and showed an EF of 30-35%. Prior echo on 02/21/15 which showed a normalization of his EF which was 50-55% with mild Hector. No longer ICD candidate. Prior echo on 03/07/14 showed an EF of 20-25% with mild/mod Hector.  ? ?Last left heart cath was done 01/09/15.  ? ?Was in the ED 05/06/21 due to abdominal pain and GI bleeding. Abdominal CT negative for acute findings. + UTI so antibiotics provided and he was released. ED visit on 04/03/21 for lower back pain. Admitted 03/01/21 due to SOB and abdominal pain/ distention. Found to be in a flutter. Initially given IV lasix with transition to oral diuretics. EP & cardiology consults obtained. Successful cardioversion completed. Discharged after 5 days. Was in the ED 02/26/21 due to lower abdominal pain. Abdominal CT was negative. Admission offered but patient declined and he was released.  ? ?He presents today for a follow-up visit with a chief complaint of minimal fatigue upon moderate exertion. He describes this as chronic in nature having been present for several years. He has associated shortness of breath and gradual weight gain along with this. He denies any dizziness, difficulty sleeping, abdominal distention, palpitations, pedal edema, chest pain or cough.  ? ?Says that he's weighing "most days" but that his scale is in metric and he can't figure out how to switch it to pounds.  ? ?Past Medical History:  ?Diagnosis Date  ? Alcohol abuse   ? Alcoholic  cardiomyopathy ([place]) 2009  ? a. 12/2007 MV: EF 28%, no isch;  b. 8/12 Echo: EF 25-35%; c. 02/2014 Echo: EF 20-25%; d. 12/2014 Cath: minimal CAD; e. 01/2015 Echo: EF 50-55%;  d. 05/2016 Echo: EF 30-35%, diff HK, gr2 DD; e. 11/2016 Echo: EF 45-50%, diff HK.  ? Chronic combined systolic (congestive) and diastolic (congestive) heart failure (HCC)   ? a. 05/2016 Most recent Echo: EF 30-35%, diff HK, Gr2 DD, mild Hector, mod dil LA; b. 11/2016 Echo: EF 45-50%, diff HK.  ? CKD (chronic kidney disease), stage III ([place])   ? Elevated troponin   ? Chronically elevated. - level was 0.17-0.10 during recent admission  ? Essential hypertension   ? GI bleed 11/2013  ? Hyperlipidemia   ? Pancreatitis   ? Paroxysmal A-fib ([place])   ? a. new onset s/p unsuccessful TEE/DCCV on 08/16/2014; b. on amio/eliquis (CHA2DS2VASc = 2-3); c. reports 1-2 hrs of afib ~ q8mos.  ? Paroxysmal atrial flutter ([place])   ? a. new onset 07/2014; b. s/p unsuccessful TEE/DCCV 08/16/2014; c. on apixaban.  ? Sleep-disordered breathing   ? Has yet to have a sleep study  ? Stroke [facility])   ? ?Past Surgical History:  ?Procedure Laterality Date  ? CARDIAC CATHETERIZATION N/A 01/09/2015  ? Procedure: Left Heart Cath and Coronary Angiography;  Surgeon: [name], MD;  Location: [place] [facility];  Service: Cardiovascular;  Laterality: N/A;  ? CARDIOVERSION N/A 03/05/2021  ? Procedure: CARDIOVERSION;  Surgeon: Kate Sable,  MD;  Location: [facility] OR[facility];  Service: Cardiovascular;  Laterality: N/A;  ? COLONOSCOPY N/A 07/22/2020  ? Procedure: COLONOSCOPY;  Surgeon: Toledo, Benay Pike, MD;  Location: [facility] ENDOSCOPY;  Service: Gastroenterology;  Laterality: N/A;  ? ELECTROPHYSIOLOGIC STUDY N/A 08/16/2014  ? Procedure: CARDIOVERSION;  Surgeon: [name], MD;  Location: [facility] OR[facility];  Service: Cardiovascular;  Laterality: N/A;  ? FLEXIBLE SIGMOIDOSCOPY N/A 10/10/2015  ? Procedure: FLEXIBLE SIGMOIDOSCOPY;  Surgeon: [name], MD;  Location: [facility] ENDOSCOPY;  Service:  Endoscopy;  Laterality: N/A;  ? KNEE SURGERY Right   ? NM MYOVIEW LTD  November 2011  ? No ischemia or infarction. EF 50-55% ( no improvement from 2009 Myoview EF of 28%  ? TEE WITHOUT CARDIOVERSION N/A 08/16/2014  ? Procedure: TRANSESOPHAGEAL ECHOCARDIOGRAM (TEE);  Surgeon: [name], MD;  Location: [facility];  Service: Cardiovascular;  Laterality: N/A;  ? TEE WITHOUT CARDIOVERSION N/A 03/05/2021  ? Procedure: TRANSESOPHAGEAL ECHOCARDIOGRAM (TEE);  Surgeon: Kate Sable, MD;  Location: [facility] OR[facility];  Service: Cardiovascular;  Laterality: N/A;  ? ?Family History  ?Problem Relation Age of Onset  ? Hypertension Mother   ? Hyperlipidemia Mother   ? Diabetes Mother   ? ?Social History  ? ?Tobacco Use  ? Smoking status: Former  ?  Packs/day: 1.00  ?  Years: 12.00  ?  Pack years: 12.00  ?  Types: Cigarettes  ?  Quit date: 02/23/1998  ?  Years since quitting: 23.2  ? Smokeless tobacco: Never  ?Substance Use Topics  ? Alcohol use: Not Currently  ?  Alcohol/week: 0.0 standard drinks  ?  Comment: Past heavy drinker  ? ?Allergies  ?Allergen Reactions  ? Esomeprazole Magnesium Other (See Comments)  ?  Suspected interstitial nephritis 2018  ? Eggs Or Egg-Derived Products Rash  ? ?Prior to Admission medications   ?Medication Sig Start Date End Date Taking? Authorizing Provider  ?allopurinol (ZYLOPRIM) 100 MG tablet Take 1 tablet by mouth daily. Taking a 1/2 tablet 02/14/21 02/14/22 Yes [provider]  ?apixaban (ELIQUIS) 5 MG TABS tablet Take 1 tablet (5 mg total) by mouth 2 (two) times daily. 05/13/21  Yes [name], MD  ?atorvastatin (LIPITOR) 80 MG tablet Take 1 tablet (80 mg total) by mouth daily. 05/13/21  Yes [name], MD  ?dapagliflozin propanediol (FARXIGA) 10 MG TABS tablet Take 1 tablet (10 mg total) by mouth daily before breakfast. 05/13/21  Yes Gollan, Kathlene November, MD  ?ferrous gluconate (FERGON) 324 MG tablet Take 324 mg by mouth daily with breakfast.   Yes [provider]   ?furosemide (LASIX) 40 MG tablet Take 1 tablet (40 mg total) by mouth daily. 05/13/21  Yes [name], MD  ?hydrALAZINE (APRESOLINE) 25 MG tablet Take 1 tablet (25 mg total) by mouth 3 (three) times daily. 05/13/21  Yes [name], MD  ?levothyroxine (SYNTHROID) 100 MCG tablet Take 100 mcg by mouth daily before breakfast.   Yes [provider]  ?pregabalin (LYRICA) 100 MG capsule Take 100 mg by mouth 2 (two) times daily. 04/28/21  Yes [provider]  ?metoprolol succinate (TOPROL-XL) 25 MG 24 hr tablet Take 1 tablet (25 mg total) by mouth daily. ?Patient not taking: Reported on 05/23/2021 05/13/21   [name], MD  ? ? ?Review of Systems  ?Constitutional:  Positive for fatigue. Negative for appetite change.  ?HENT:  Negative for congestion, postnasal drip and sore throat.   ?Eyes: Negative.   ?Respiratory:  Positive for shortness of breath.  Negative for cough and chest tightness.   ?Cardiovascular:  Negative for chest pain, palpitations and leg swelling.  ?Gastrointestinal:  Negative for abdominal distention and abdominal pain.  ?Endocrine: Negative.   ?Genitourinary: Negative.   ?Musculoskeletal:  Positive for arthralgias (left hand) and back pain. Negative for neck pain.  ?Skin: Negative.   ?Allergic/Immunologic: Negative.   ?Neurological:  Positive for numbness (down left arm (pinched nerve)). Negative for dizziness, weakness and light-headedness.  ?Hematological:  Negative for adenopathy. Does not bruise/bleed easily.  ?Psychiatric/Behavioral:  Negative for dysphoric mood and sleep disturbance (not wearing CPAP due to supply issue; sleeping on 1 pillow). The patient is not nervous/anxious.   ? ?Vitals:  ? 05/23/21 1316  ?BP: (!) 140/98  ?Pulse: 76  ?Resp: 18  ?SpO2: 98%  ?Weight: 278 lb 2 oz (126.2 kg)  ?Height: 5\' 11"  (1.803 m)  ? ?Wt Readings from Last 3 Encounters:  ?05/23/21 278 lb 2 oz (126.2 kg)  ?05/13/21 267 lb 8 oz (121.3 kg)  ?05/06/21 273 lb (123.8 kg)  ? ?Lab  Results  ?Component Value Date  ? CREATININE 2.70 (H) 05/06/2021  ? CREATININE 2.27 (H) 04/03/2021  ? CREATININE 2.51 (H) 04/01/2021  ? ?Physical Exam ?Vitals and nursing note reviewed.  ?Constitutional:   ?   General: He is not in acu

## 2021-05-23 ENCOUNTER — Ambulatory Visit: Payer: Medicaid Other | Attending: Family | Admitting: Family

## 2021-05-23 ENCOUNTER — Encounter: Payer: Self-pay | Admitting: Family

## 2021-05-23 VITALS — BP 140/98 | HR 76 | Resp 18 | Ht 71.0 in | Wt 278.1 lb

## 2021-05-23 DIAGNOSIS — M25542 Pain in joints of left hand: Secondary | ICD-10-CM | POA: Diagnosis not present

## 2021-05-23 DIAGNOSIS — I252 Old myocardial infarction: Secondary | ICD-10-CM | POA: Insufficient documentation

## 2021-05-23 DIAGNOSIS — Z8719 Personal history of other diseases of the digestive system: Secondary | ICD-10-CM | POA: Insufficient documentation

## 2021-05-23 DIAGNOSIS — R531 Weakness: Secondary | ICD-10-CM | POA: Insufficient documentation

## 2021-05-23 DIAGNOSIS — I429 Cardiomyopathy, unspecified: Secondary | ICD-10-CM | POA: Insufficient documentation

## 2021-05-23 DIAGNOSIS — M549 Dorsalgia, unspecified: Secondary | ICD-10-CM | POA: Insufficient documentation

## 2021-05-23 DIAGNOSIS — Z8249 Family history of ischemic heart disease and other diseases of the circulatory system: Secondary | ICD-10-CM | POA: Diagnosis not present

## 2021-05-23 DIAGNOSIS — I480 Paroxysmal atrial fibrillation: Secondary | ICD-10-CM | POA: Diagnosis not present

## 2021-05-23 DIAGNOSIS — Z9989 Dependence on other enabling machines and devices: Secondary | ICD-10-CM | POA: Insufficient documentation

## 2021-05-23 DIAGNOSIS — I10 Essential (primary) hypertension: Secondary | ICD-10-CM | POA: Diagnosis not present

## 2021-05-23 DIAGNOSIS — E785 Hyperlipidemia, unspecified: Secondary | ICD-10-CM | POA: Insufficient documentation

## 2021-05-23 DIAGNOSIS — Z8349 Family history of other endocrine, nutritional and metabolic diseases: Secondary | ICD-10-CM | POA: Diagnosis not present

## 2021-05-23 DIAGNOSIS — I130 Hypertensive heart and chronic kidney disease with heart failure and stage 1 through stage 4 chronic kidney disease, or unspecified chronic kidney disease: Secondary | ICD-10-CM | POA: Insufficient documentation

## 2021-05-23 DIAGNOSIS — R200 Anesthesia of skin: Secondary | ICD-10-CM | POA: Insufficient documentation

## 2021-05-23 DIAGNOSIS — Z7984 Long term (current) use of oral hypoglycemic drugs: Secondary | ICD-10-CM | POA: Insufficient documentation

## 2021-05-23 DIAGNOSIS — G588 Other specified mononeuropathies: Secondary | ICD-10-CM | POA: Diagnosis not present

## 2021-05-23 DIAGNOSIS — N183 Chronic kidney disease, stage 3 unspecified: Secondary | ICD-10-CM | POA: Insufficient documentation

## 2021-05-23 DIAGNOSIS — F1011 Alcohol abuse, in remission: Secondary | ICD-10-CM | POA: Insufficient documentation

## 2021-05-23 DIAGNOSIS — G4733 Obstructive sleep apnea (adult) (pediatric): Secondary | ICD-10-CM | POA: Insufficient documentation

## 2021-05-23 DIAGNOSIS — Z87440 Personal history of urinary (tract) infections: Secondary | ICD-10-CM | POA: Insufficient documentation

## 2021-05-23 DIAGNOSIS — I5022 Chronic systolic (congestive) heart failure: Secondary | ICD-10-CM | POA: Insufficient documentation

## 2021-05-23 NOTE — Patient Instructions (Addendum)
Resume weighing daily and call for an overnight weight gain of 3 pounds or more or a weekly weight gain of more than 5 pounds.   If you have voicemail, please make sure your mailbox is cleaned out so that we may leave a message and please make sure to listen to any voicemails.     

## 2021-07-09 ENCOUNTER — Encounter (HOSPITAL_COMMUNITY): Payer: Self-pay

## 2021-07-09 ENCOUNTER — Other Ambulatory Visit (HOSPITAL_COMMUNITY): Payer: Self-pay

## 2021-07-09 NOTE — Progress Notes (Signed)
Today had a home visit with Hector [name].  He states his belly is swollen, that he has fluid.  Abdomen is distended, no edema in lower legs.  Lungs are clear.  Vitals bp elevated.  He is not weighing.  He states he has been packing and did not take his meds for 3 days.  He states took them yesterday but has not took them yet today because he has not ate yet.  He denies increased shortness of breath or chest pain.  Contacted Tina with HF clinic and she advised for him to take 2 furosemide for 3 days and see how that does.  He appears to understand.  Also made him an appt at HF clinic and possibly switch his diuretic to torsemide.  His mood is good.  He states moving because his moving is being discharged from nursing facility and she wants a new place to live.  He states already has a place in mind.  He states has brothers that will help him move everything.  He states been feeling well and getting around good.  He denies needing anything for daily living.  He states he packed his scale up, advised him importance of weighing.  He states will let me know his new address once moved.  Will continue to visit for heart failure, diet and medication management.  ? ?Kenlei Safi ?[place] EMT-Paramedic ?[phone] ?

## 2021-07-21 NOTE — Progress Notes (Deleted)
[name], male    DOB: 03/04/1965, 56 y.o.   MRN: [id number]   Hector [name] is a 56 y/o male with a history of MI, hyperlipidemia, HTN, CKD, atrial fibrillation, alcohol use, cardiomyopathy, and CHF.   TEE report from 03/05/21 reviewed and showed an EF of <20% along with mild/moderate Hector but no thrombus. Echo report from 09/13/20 reviewed showed an EF of 25-30% along with moderate Hector. Echo done 12/03/16 showed an EF of 45-50% which is an improvement from previous echo which was done on 06/02/16 and showed an EF of 30-35%. Prior echo on 02/21/15 which showed a normalization of his EF which was 50-55% with mild Hector. No longer ICD candidate. Prior echo on 03/07/14 showed an EF of 20-25% with mild/mod Hector.   Last left heart cath was done 01/09/15.   Was in the ED 05/06/21 due to abdominal pain and GI bleeding. Abdominal CT negative for acute findings. + UTI so antibiotics provided and he was released. ED visit on 04/03/21 for lower back pain. Admitted 03/01/21 due to SOB and abdominal pain/ distention. Found to be in a flutter. Initially given IV lasix with transition to oral diuretics. EP & cardiology consults obtained. Successful cardioversion completed. Discharged after 5 days. Was in the ED 02/26/21 due to lower abdominal pain. Abdominal CT was negative. Admission offered but patient declined and he was released.   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 12/2007 MV: EF 28%, no isch;  b. 8/12 Echo: EF 25-35%; c. 02/2014 Echo: EF 20-25%; d. 12/2014 Cath: minimal CAD; e. 01/2015 Echo: EF 50-55%;  d. 05/2016 Echo: EF 30-35%, diff HK, gr2 DD; e. 11/2016 Echo: EF 45-50%, diff HK.   Chronic combined systolic (congestive) and diastolic (congestive) heart failure ([place])    a. 05/2016 Most recent Echo: EF 30-35%, diff HK, Gr2 DD, mild Hector, mod dil LA; b. 11/2016 Echo: EF  45-50%, diff HK.   CKD (chronic kidney disease), stage III (HCC)    Elevated troponin    Chronically elevated. - level was 0.17-0.10 during recent admission   Essential hypertension    GI bleed 11/2013   Hyperlipidemia    Pancreatitis    Paroxysmal A-fib (HCC)    a. new onset s/p unsuccessful TEE/DCCV on 08/16/2014; b. on amio/eliquis (CHA2DS2VASc = 2-3); c. reports 1-2 hrs of afib ~ q29mos.   Paroxysmal atrial flutter ([place])    a. new onset 07/2014; b. s/p unsuccessful TEE/DCCV 08/16/2014; c. on apixaban.   Sleep-disordered breathing    Has yet to have a sleep study   Stroke [facility])    Past Surgical History:  Procedure Laterality Date   CARDIAC CATHETERIZATION N/A 01/09/2015   Procedure: Left Heart Cath and Coronary Angiography;  Surgeon: [name], MD;  Location: [facility];  Service: Cardiovascular;  Laterality: N/A;   CARDIOVERSION N/A 03/05/2021   Procedure: CARDIOVERSION;  Surgeon: Kate Sable, MD;  Location: [facility] ORS;  Service: Cardiovascular;  Laterality: N/A;   COLONOSCOPY N/A 07/22/2020   Procedure: COLONOSCOPY;  Surgeon: Toledo, Benay Pike, MD;  Location: [facility] ENDOSCOPY;  Service: Gastroenterology;  Laterality: N/A;   ELECTROPHYSIOLOGIC STUDY N/A 08/16/2014   Procedure: CARDIOVERSION;  Surgeon: [name], MD;  Location: [facility] ORS;  Service: Cardiovascular;  Laterality: N/A;   FLEXIBLE SIGMOIDOSCOPY N/A 10/10/2015   Procedure: FLEXIBLE  SIGMOIDOSCOPY;  Surgeon: [name], MD;  Location: [facility] ENDOSCOPY;  Service: Endoscopy;  Laterality: N/A;   KNEE SURGERY Right    NM MYOVIEW LTD  November 2011   No ischemia or infarction. EF 50-55% ( no improvement from 2009 Myoview EF of 28%   TEE WITHOUT CARDIOVERSION N/A 08/16/2014   Procedure: TRANSESOPHAGEAL ECHOCARDIOGRAM (TEE);  Surgeon: [name], MD;  Location: [facility] ORS;  Service: Cardiovascular;  Laterality: N/A;   TEE WITHOUT CARDIOVERSION N/A 03/05/2021   Procedure: TRANSESOPHAGEAL ECHOCARDIOGRAM  (TEE);  Surgeon: Kate Sable, MD;  Location: [facility] ORS;  Service: Cardiovascular;  Laterality: N/A;   Family History  Problem Relation Age of Onset   Hypertension Mother    Hyperlipidemia Mother    Diabetes Mother    Social History   Tobacco Use   Smoking status: Former    Packs/day: 1.00    Years: 12.00    Pack years: 12.00    Types: Cigarettes    Quit date: 02/23/1998    Years since quitting: 23.4   Smokeless tobacco: Never  Substance Use Topics   Alcohol use: Not Currently    Alcohol/week: 0.0 standard drinks    Comment: Past heavy drinker   Allergies  Allergen Reactions   Esomeprazole Magnesium Other (See Comments)    Suspected interstitial nephritis 2018   Eggs Or Egg-Derived Products Rash     Review of Systems  Constitutional:  Positive for fatigue. Negative for appetite change.  HENT:  Negative for congestion, postnasal drip and sore throat.   Eyes: Negative.   Respiratory:  Positive for shortness of breath. Negative for cough and chest tightness.   Cardiovascular:  Negative for chest pain, palpitations and leg swelling.  Gastrointestinal:  Negative for abdominal distention and abdominal pain.  Endocrine: Negative.   Genitourinary: Negative.   Musculoskeletal:  Positive for arthralgias (left hand) and back pain. Negative for neck pain.  Skin: Negative.   Allergic/Immunologic: Negative.   Neurological:  Positive for numbness (down left arm (pinched nerve)). Negative for dizziness, weakness and light-headedness.  Hematological:  Negative for adenopathy. Does not bruise/bleed easily.  Psychiatric/Behavioral:  Negative for dysphoric mood and sleep disturbance (not wearing CPAP due to supply issue; sleeping on 1 pillow). The patient is not nervous/anxious.      Physical Exam Vitals and nursing note reviewed.  Constitutional:      General: He is not in acute distress.    Appearance: Normal appearance.  HENT:     Head: Normocephalic and atraumatic.  Neck:      Vascular: No JVD.  Cardiovascular:     Rate and Rhythm: Normal rate and regular rhythm.  Pulmonary:     Effort: Pulmonary effort is normal. No respiratory distress.     Breath sounds: No wheezing or rales.  Abdominal:     General: There is no distension.     Palpations: Abdomen is soft.  Musculoskeletal:     Cervical back: Normal range of motion and neck supple.     Right lower leg: No edema.     Left lower leg: No edema.  Skin:    General: Skin is warm and dry.  Neurological:     General: No focal deficit present.     Mental Status: He is alert and oriented to person, place, and time.     Motor: Weakness present.  Psychiatric:        Mood and Affect: Mood normal.        Behavior: Behavior normal.  Thought Content: Thought content normal.    Assessment & Plan:  1: Chronic heart failure with reduced ejection fraction- - NYHA Class II - euvolemic today - weighing "most day" although says his scale is in kg and he can't figure out how to change it to pounds; reminded to call for an overnight weight gain of >2 pounds or kg or a weekly weight gain of >5 pounds - weight 278.2 pounds from last visit here 2 months ago - on GDMT of farxiga - discussed adjusting meds at next visit; he doesn't want to do anything today because he's getting ready to start lyrica - not adding salt to his food - saw cardiology Rockey Situ) 05/13/21 - saw EP Quentin Ore) 04/30/21 - BNP 02/28/21 was 1449.1  2: HTN- - BP  - follows with PCP at [facility] and was there last week - BMP from 05/06/21 reviewed and showed sodium 138, potassium 4.2, creatinine 2.7 and GFR 27 - saw nephrology ([place]) 06/11/21  3: Obstructive sleep apnea- - sleeping well at night  - now has CPAP and says that he wears it "most nights" - encouraged to wear it every night and any time he's napping during the day   Medication bottles reviewed.

## 2021-07-22 ENCOUNTER — Emergency Department
Admission: EM | Admit: 2021-07-22 | Discharge: 2021-07-22 | Disposition: A | Discharge status: Home / Self Care | Payer: Medicaid Other | Attending: Emergency Medicine | Admitting: Emergency Medicine

## 2021-07-22 ENCOUNTER — Other Ambulatory Visit: Payer: Self-pay

## 2021-07-22 ENCOUNTER — Ambulatory Visit: Payer: Medicaid Other | Admitting: Family

## 2021-07-22 ENCOUNTER — Emergency Department: Payer: Medicaid Other

## 2021-07-22 DIAGNOSIS — N189 Chronic kidney disease, unspecified: Secondary | ICD-10-CM | POA: Diagnosis not present

## 2021-07-22 DIAGNOSIS — I509 Heart failure, unspecified: Secondary | ICD-10-CM | POA: Insufficient documentation

## 2021-07-22 DIAGNOSIS — I5023 Acute on chronic systolic (congestive) heart failure: Secondary | ICD-10-CM

## 2021-07-22 DIAGNOSIS — R140 Abdominal distension (gaseous): Secondary | ICD-10-CM | POA: Diagnosis present

## 2021-07-22 LAB — BASIC METABOLIC PANEL
Anion gap: 8 (ref 5–15)
BUN: 43 mg/dL — ABNORMAL HIGH (ref 6–20)
CO2: 24 mmol/L (ref 22–32)
Calcium: 8.4 mg/dL — ABNORMAL LOW (ref 8.9–10.3)
Chloride: 105 mmol/L (ref 98–111)
Creatinine, Ser: 3.11 mg/dL — ABNORMAL HIGH (ref 0.61–1.24)
GFR, Estimated: 23 mL/min — ABNORMAL LOW (ref >60)
Glucose, Bld: 213 mg/dL — ABNORMAL HIGH (ref 70–99)
Potassium: 3.9 mmol/L (ref 3.5–5.1)
Sodium: 137 mmol/L (ref 135–145)

## 2021-07-22 LAB — CBC
HCT: 46.3 % (ref 39.0–52.0)
Hemoglobin: 14.2 g/dL (ref 13.0–17.0)
MCH: 26.3 pg (ref 26.0–34.0)
MCHC: 30.7 g/dL (ref 30.0–36.0)
MCV: 85.9 fL (ref 80.0–100.0)
Platelets: 256 10*3/uL (ref 150–400)
RBC: 5.39 MIL/uL (ref 4.22–5.81)
RDW: 16.5 % — ABNORMAL HIGH (ref 11.5–15.5)
WBC: 5.6 10*3/uL (ref 4.0–10.5)
nRBC: 0 % (ref 0.0–0.2)

## 2021-07-22 LAB — BRAIN NATRIURETIC PEPTIDE: B Natriuretic Peptide: 1343 pg/mL — ABNORMAL HIGH (ref 0.0–100.0)

## 2021-07-22 LAB — TROPONIN I (HIGH SENSITIVITY): Troponin I (High Sensitivity): 79 ng/L — ABNORMAL HIGH (ref <18)

## 2021-07-22 IMAGING — CR DG CHEST 2V
1 series · 2 of 2 positions shown · non-contrast
Comparison: Chest radiographs [DATE] and earlier.

CLINICAL DATA: 55-year-old male with shortness of breath. Abdominal
pain. CHF.

EXAM:
CHEST - 2 VIEW

[Series 1: dg chest 2 view · 0.14mm/px · 2 of 2 slices shown]
[im 1/2]
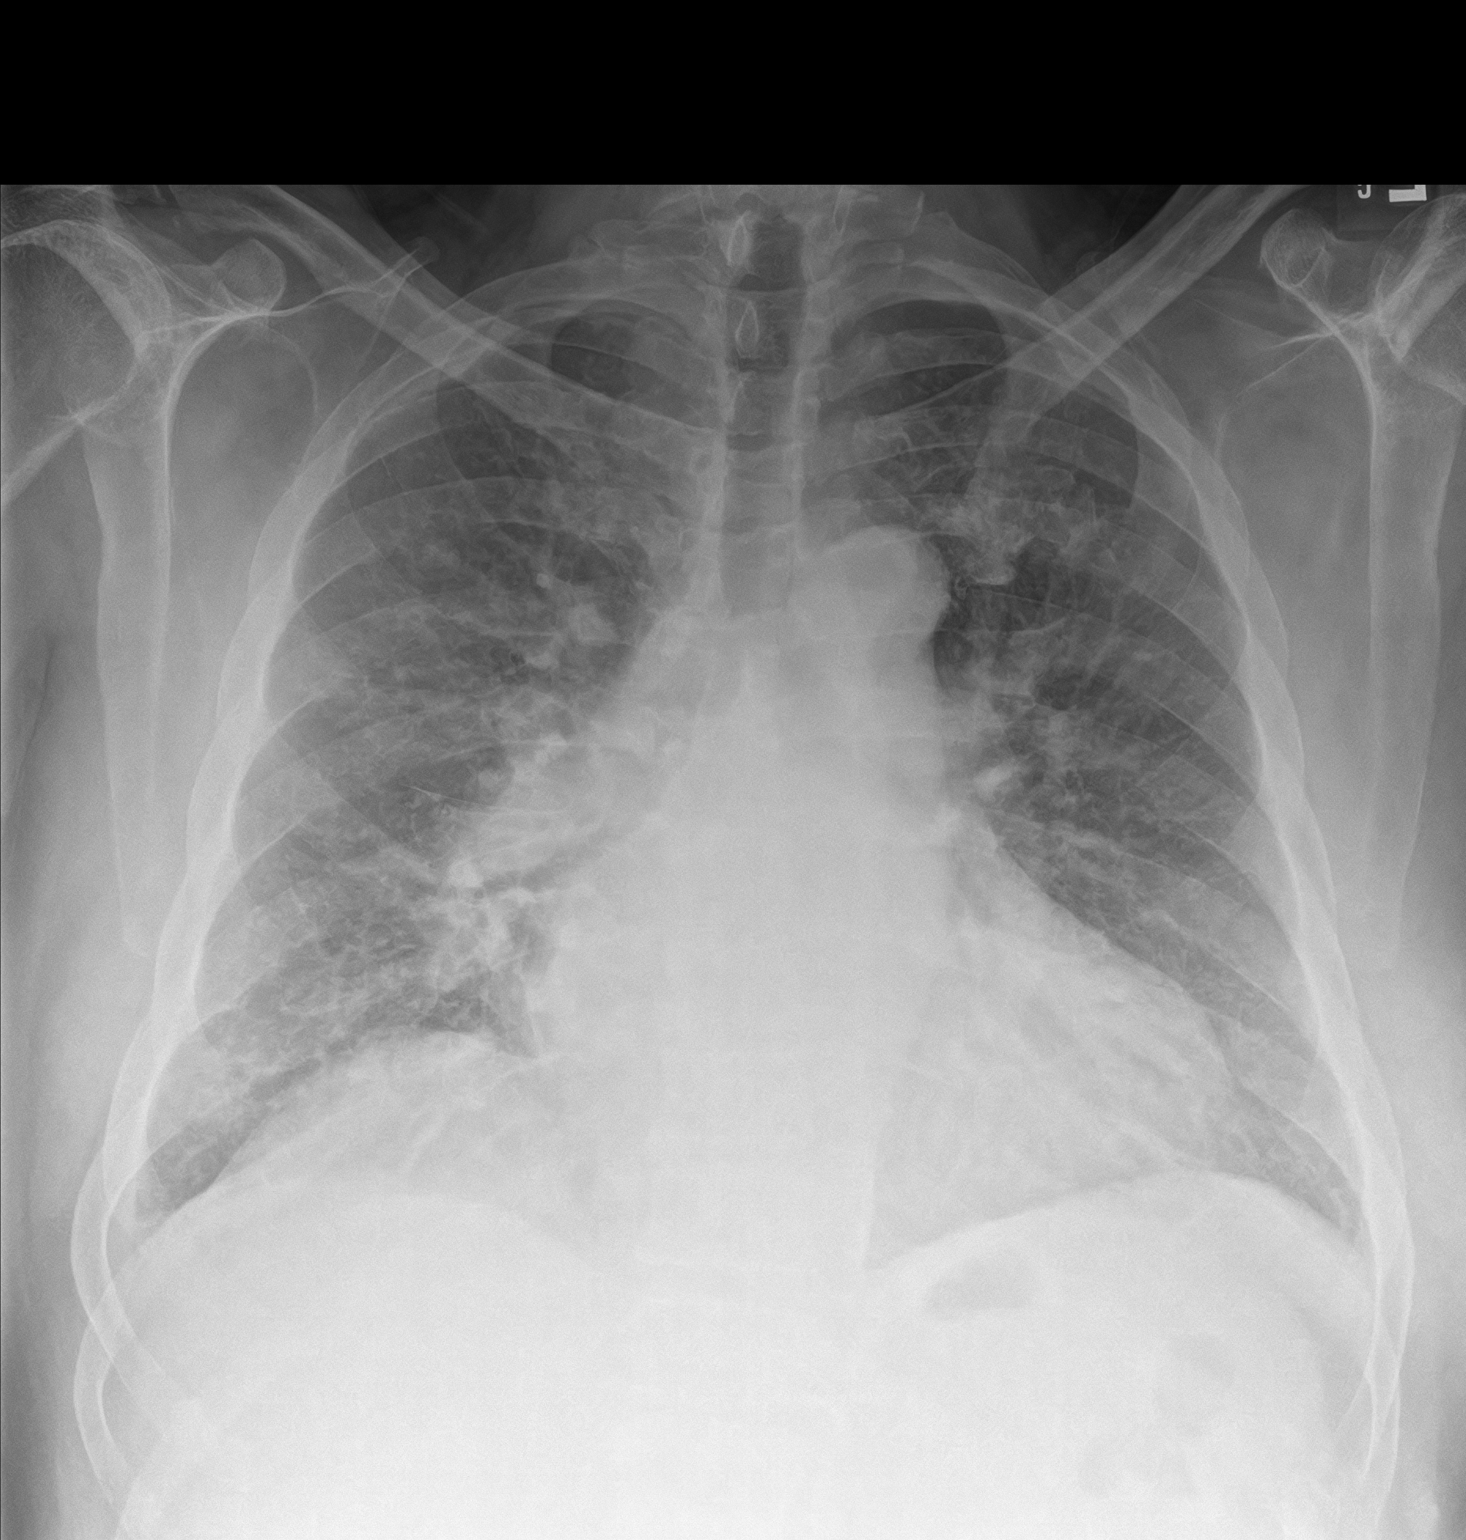
[im 2/2]
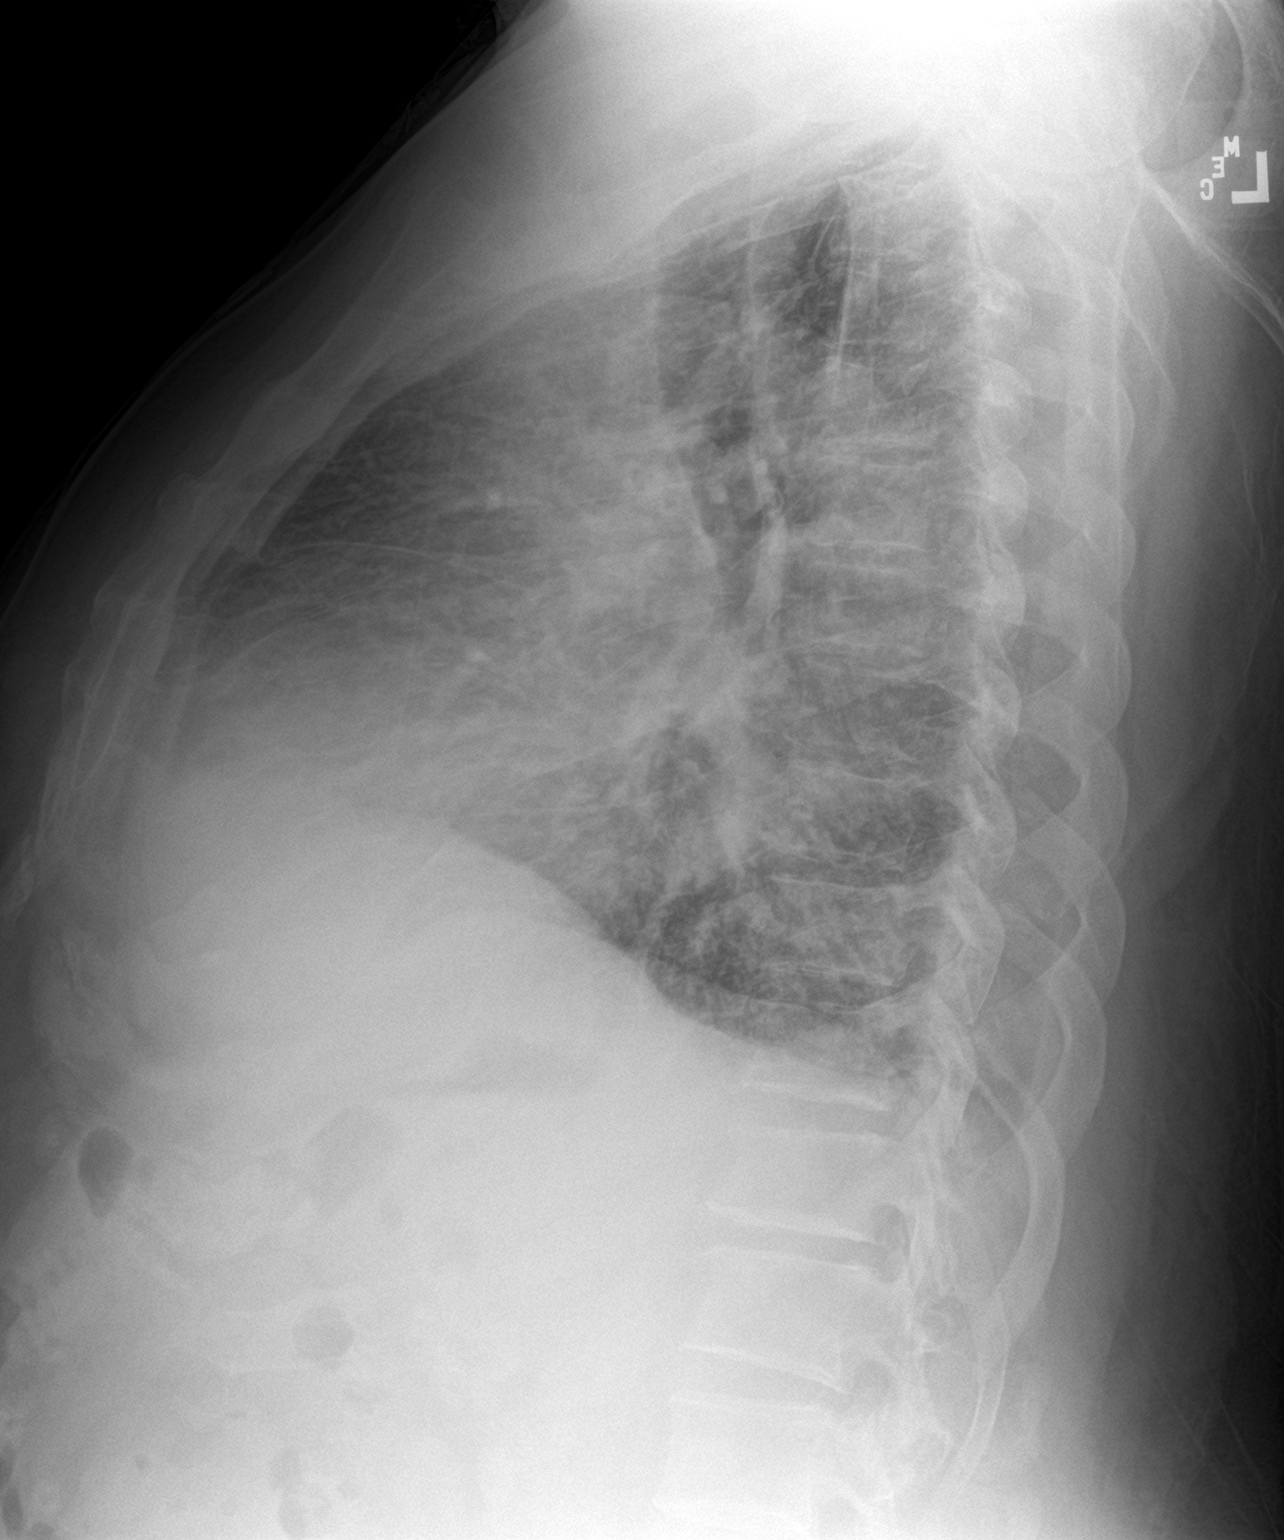

[2 of 2 positions shown; findings below may reference images not displayed]

FINDINGS: PA and lateral views at [3N] hours. Stable cardiomegaly and
mediastinal contours. Pulmonary vascular congestion appears stable
since last year. Small posterior costophrenic angle pleural
effusions are more apparent. No pneumothorax or consolidation.
Visualized tracheal air column is within normal limits. No acute
osseous abnormality identified. Negative visible bowel gas.
IMPRESSION: Cardiomegaly and pulmonary vascular congestion with small pleural
effusions. Consider mild or developing pulmonary edema.

## 2021-07-22 MED ORDER — FUROSEMIDE 40 MG PO TABS: 80.0000 mg | ORAL_TABLET | Freq: Every day | ORAL | 0 refills | Status: DC

## 2021-07-22 MED ORDER — FUROSEMIDE 10 MG/ML IJ SOLN
40.0000 mg | Freq: Once | INTRAMUSCULAR | Status: AC
Start: 2021-07-22 — End: 2021-07-22
  Administered 2021-07-22: 40 mg via INTRAVENOUS
  Filled 2021-07-22: qty 4

## 2021-07-22 MED ORDER — POTASSIUM CHLORIDE CRYS ER 20 MEQ PO TBCR: 20.0000 meq | EXTENDED_RELEASE_TABLET | Freq: Every day | ORAL | 0 refills | Status: DC

## 2021-07-22 NOTE — ED Triage Notes (Signed)
Pt comes into the ED via EMS from home with c/o generalized abd pain with distention with a HX of CHF, states he takes "fluids pills but sometimes the fluid builds up in my stomach and I cant take it" pt is in NAD  190/100 HR96 98%RA 172/130 HR93 95%RA

## 2021-07-22 NOTE — ED Notes (Signed)
Sent rainbow to lab. 

## 2021-07-22 NOTE — ED Provider Notes (Signed)
[facility] Provider Note    Event Date/Time   First MD Initiated Contact with Patient 07/22/21 1009     (approximate)   History   Chief Complaint: Abdominal Pain   HPI  Hector [name] is a 56 y.o. male with a history of CKD, atrial flutter, CHF, morbid obesity who comes ED complaining of abdominal distention and increased fluid buildup.  This has been gradual and worsening over the past several weeks.  He reports compliance with his medications including daily Lasix but states that he is not getting adequate urine output.  He does not check his weight regularly.  He denies chest pain fever or cough.  He has some dyspnea on exertion, but exercise tolerance has not changed from baseline.  I reviewed outside records, he was visited by community outreach paramedic 2 weeks ago who communicated with CHF clinic, recommended he increase his Lasix dose.  Time at follow-up in the heart failure clinic.  There is no subsequent encounter in the heart failure clinic, so it seems that that follow-up plan was not carried out by the patient.     Physical Exam   Triage Vital Signs: ED Triage Vitals [07/22/21 0909]  Enc Vitals Group     BP (!) 168/122     Pulse Rate 86     Resp 19     Temp 97.7 F (36.5 C)     Temp Source Oral     SpO2 93 %     Weight      Height      Head Circumference      Peak Flow      Pain Score      Pain Loc      Pain Edu?      Excl. in [place]?     Most recent vital signs: Vitals:   07/22/21 0909 07/22/21 1035  BP: (!) 168/122 (!) 160/110  Pulse: 86 78  Resp: 19 17  Temp: 97.7 F (36.5 C) 98.5 F (36.9 C)  SpO2: 93% 93%    General: Awake, no distress.  CV:  Good peripheral perfusion.  Regular rate and rhythm.  No JVD Resp:  Normal effort.  Good air entry bilaterally.  No focal crackles or wheezing. Abd:  No distention.  Soft and nontender. Other:  Trace pitting edema bilateral lower extremities.  No calf tenderness.  Symmetric  calf circumference.   ED Results / Procedures / Treatments   Labs (all labs ordered are listed, but only abnormal results are displayed) Labs Reviewed  BASIC METABOLIC PANEL - Abnormal; Notable for the following components:      Result Value   Glucose, Bld 213 (*)    BUN 43 (*)    Creatinine, Ser 3.11 (*)    Calcium 8.4 (*)    GFR, Estimated 23 (*)    All other components within normal limits  CBC - Abnormal; Notable for the following components:   RDW 16.5 (*)    All other components within normal limits  BRAIN NATRIURETIC PEPTIDE - Abnormal; Notable for the following components:   B Natriuretic Peptide 1,343.0 (*)    All other components within normal limits  TROPONIN I (HIGH SENSITIVITY) - Abnormal; Notable for the following components:   Troponin I (High Sensitivity) 79 (*)    All other components within normal limits     EKG Interpreted by me Sinus rhythm, rate of 81.  Right axis, prolonged QTc of 525 ms.  Poor R wave  progression.  Normal ST segments and T waves.  3 PVCs on the strip.   RADIOLOGY Chest x-ray viewed and interpreted by me, no frank pulmonary edema or pleural effusion.  No consolidation.  Radiology report reviewed, suggesting signs of vascular congestion.   PROCEDURES:  Procedures   MEDICATIONS ORDERED IN ED: Medications  furosemide (LASIX) injection 40 mg (40 mg Intravenous Given 07/22/21 1036)     IMPRESSION / MDM / ASSESSMENT AND PLAN / ED COURSE  I reviewed the triage vital signs and the nursing notes.                              Differential diagnosis includes, but is not limited to, decompensated heart failure, non-STEMI, anemia, electrolyte abnormality  Patient's presentation is most consistent with exacerbation of chronic illness.  Patient presents with subjective weight gain/fluid retention in the setting of congestive heart failure.  No other acute symptoms such as chest pain or worsening shortness of breath.   Vital signs are  unremarkable, oxygen saturation 96% on room air on my exam.  Exam is reassuring.  Labs show elevated BNP and high-sensitivity troponin which are roughly equivalent to his chronic baseline.  No pneumonia on chest x-ray.  He was given a dose of IV Lasix and had urine output of about 1000 mL, and reports that he is feeling better.  I will refer him to heart failure clinic, increase Lasix for now, add potassium supplement.  He does not require admission due to his reassuring work-up, stable oxygen level, and ability to diurese in the ED.  Considering the patient's symptoms, medical history, and physical examination today, I have low suspicion for ACS, PE, TAD, pneumothorax, carditis, mediastinitis, pneumonia, pulmonary edema, or sepsis.        FINAL CLINICAL IMPRESSION(S) / ED DIAGNOSES   Final diagnoses:  Chronic congestive heart failure, unspecified heart failure type ([place])     Rx / DC Orders   ED Discharge Orders          Ordered    furosemide (LASIX) 40 MG tablet  Daily        07/22/21 1240    potassium chloride SA (KLOR-CON M) 20 MEQ tablet  Daily        07/22/21 1240             Note:  This document was prepared using Dragon voice recognition software and may include unintentional dictation errors.   Carrie Mew, MD 07/22/21 1249

## 2021-07-23 ENCOUNTER — Telehealth (HOSPITAL_COMMUNITY): Payer: Self-pay

## 2021-07-23 NOTE — Telephone Encounter (Signed)
Contacted to check on Hector [name] to try to make a home visit today due to his ED visit yesterday.  He states he is busy and need to go places.  He states has not took his medications yet today because he is going off.  Asked if he picked up his prescriptions yet, he states he will today and take them once back home.  Explained that he need to take 80 mg lasix instead of the 40 mg for 5 days, he states he knows.  Explained if he would have went to his appt yesterday at HF clinic she could have gave him the lasix just like the ED did, he states he did not know. Reminded him of appt next week with Otila Kluver at HF clinic and he states aware of it.  He staes he should be able to go.  Advised will check back with him and any problems he could call me or Otila Kluver at HF clinic.  Advised him we are here to help him to get the fluid off.  He states he feels better today but still has a lot of fluid.  Will try again to make a home visit another day.   [place] [phone]

## 2021-07-26 NOTE — Progress Notes (Signed)
Hector [name] ID: Hector [name], male    DOB: [birth year], 56 y.o.   MRN: [id number]   Hector [name] is a 56 y/o male with a history of MI, hyperlipidemia, HTN, CKD, atrial fibrillation, alcohol use, cardiomyopathy, and CHF.   TEE report from 03/05/21 reviewed and showed an EF of <20% along with mild/moderate Hector but no thrombus. Echo report from 09/13/20 reviewed showed an EF of 25-30% along with moderate Hector. Echo done 12/03/16 showed an EF of 45-50% which is an improvement from previous echo which was done on 06/02/16 and showed an EF of 30-35%. Prior echo on 02/21/15 which showed a normalization of his EF which was 50-55% with mild Hector. No longer ICD candidate. Prior echo on 03/07/14 showed an EF of 20-25% with mild/mod Hector.   Last left heart cath was done 01/09/15.   Was in the ED 07/22/21 due to acute on chronic HF. Given IV lasix and home diuretic increased. and Hector [name] was released. Was in the ED 05/06/21 due to abdominal pain and GI bleeding. Abdominal CT negative for acute findings. + UTI so antibiotics provided and Hector [name] was released. ED visit on 04/03/21 for lower back pain. Admitted 03/01/21 due to SOB and abdominal pain/ distention. Found to be in a flutter. Initially given IV lasix with transition to oral diuretics. EP & cardiology consults obtained. Successful cardioversion completed. Discharged after 5 days. Was in the ED 02/26/21 due to lower abdominal pain. Abdominal CT was negative. Admission offered but Hector [name] declined and Hector [name] was released.   Hector [name] presents today for a follow-up visit with a chief complaint of moderate shortness of breath with minimal exertion. Describes this as chronic although has worsened over the last several days. Hector [name] has associated fatigue, abdominal distention (worsening) and chronic pain along with this. Hector [name] denies any difficulty sleeping, dizziness, palpitations, pedal edema, chest pain or cough.   Says that Hector [name] hasn't been weighing himself but Hector [name]'s difficult to understand as to why  Hector [name]'s not weighing. Hector [name] says that Hector [name] hasn't taken any of his medications in >48 hours because Hector [name]'s so swollen and can't eat anything so Hector [name] can't take his medications if Hector [name] doesn't eat. Paramedic's note the day after recent ED visit mentions that Hector [name] hadn't been taking his medications yet that day.   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 12/2007 MV: EF 28%, no isch;  b. 8/12 Echo: EF 25-35%; c. 02/2014 Echo: EF 20-25%; d. 12/2014 Cath: minimal CAD; e. 01/2015 Echo: EF 50-55%;  d. 05/2016 Echo: EF 30-35%, diff HK, gr2 DD; e. 11/2016 Echo: EF 45-50%, diff HK.   Chronic combined systolic (congestive) and diastolic (congestive) heart failure ([place])    a. 05/2016 Most recent Echo: EF 30-35%, diff HK, Gr2 DD, mild Hector, mod dil LA; b. 11/2016 Echo: EF 45-50%, diff HK.   CKD (chronic kidney disease), stage III (HCC)    Elevated troponin    Chronically elevated. - level was 0.17-0.10 during recent admission   Essential hypertension    GI bleed 11/2013   Hyperlipidemia    Pancreatitis    Paroxysmal A-fib (HCC)    a. new onset s/p unsuccessful TEE/DCCV on 08/16/2014; b. on amio/eliquis (CHA2DS2VASc = 2-3); c. reports 1-2 hrs of afib ~ q48mos.   Paroxysmal atrial flutter ([place])    a. new onset 07/2014; b. s/p unsuccessful TEE/DCCV 08/16/2014; c. on apixaban.   Sleep-disordered breathing    Has yet  to have a sleep study   Stroke [facility])    Past Surgical History:  Procedure Laterality Date   CARDIAC CATHETERIZATION N/A 01/09/2015   Procedure: Left Heart Cath and Coronary Angiography;  Surgeon: [name], MD;  Location: [place] [facility];  Service: Cardiovascular;  Laterality: N/A;   CARDIOVERSION N/A 03/05/2021   Procedure: CARDIOVERSION;  Surgeon: Kate Sable, MD;  Location: [facility] ORS;  Service: Cardiovascular;  Laterality: N/A;   COLONOSCOPY N/A 07/22/2020   Procedure: COLONOSCOPY;  Surgeon: Toledo, Benay Pike, MD;  Location: [facility] ENDOSCOPY;   Service: Gastroenterology;  Laterality: N/A;   ELECTROPHYSIOLOGIC STUDY N/A 08/16/2014   Procedure: CARDIOVERSION;  Surgeon: [name], MD;  Location: [facility] ORS;  Service: Cardiovascular;  Laterality: N/A;   FLEXIBLE SIGMOIDOSCOPY N/A 10/10/2015   Procedure: FLEXIBLE SIGMOIDOSCOPY;  Surgeon: [name], MD;  Location: [facility] ENDOSCOPY;  Service: Endoscopy;  Laterality: N/A;   KNEE SURGERY Right    NM MYOVIEW LTD  November 2011   No ischemia or infarction. EF 50-55% ( no improvement from 2009 Myoview EF of 28%   TEE WITHOUT CARDIOVERSION N/A 08/16/2014   Procedure: TRANSESOPHAGEAL ECHOCARDIOGRAM (TEE);  Surgeon: [name], MD;  Location: [facility] ORS;  Service: Cardiovascular;  Laterality: N/A;   TEE WITHOUT CARDIOVERSION N/A 03/05/2021   Procedure: TRANSESOPHAGEAL ECHOCARDIOGRAM (TEE);  Surgeon: Kate Sable, MD;  Location: [facility] ORS;  Service: Cardiovascular;  Laterality: N/A;   Family History  Problem Relation Age of Onset   Hypertension Mother    Hyperlipidemia Mother    Diabetes Mother    Social History   Tobacco Use   Smoking status: Former    Packs/day: 1.00    Years: 12.00    Pack years: 12.00    Types: Cigarettes    Quit date: 02/23/1998    Years since quitting: 23.4   Smokeless tobacco: Never  Substance Use Topics   Alcohol use: Not Currently    Alcohol/week: 0.0 standard drinks    Comment: Past heavy drinker   Allergies  Allergen Reactions   Esomeprazole Magnesium Other (See Comments)    Suspected interstitial nephritis 2018   Eggs Or Egg-Derived Products Rash   Prior to Admission medications   Medication Sig Start Date End Date Taking? Authorizing Provider  allopurinol (ZYLOPRIM) 100 MG tablet Take 1 tablet by mouth daily. Taking a 1/2 tablet 02/14/21 02/14/22  [provider]  apixaban (ELIQUIS) 5 MG TABS tablet Take 1 tablet (5 mg total) by mouth 2 (two) times daily. 05/13/21   [name], MD  atorvastatin (LIPITOR) 80 MG tablet  Take 1 tablet (80 mg total) by mouth daily. 05/13/21   [name], MD  dapagliflozin propanediol (FARXIGA) 10 MG TABS tablet Take 1 tablet (10 mg total) by mouth daily before breakfast. 05/13/21   Gollan, Kathlene November, MD  ferrous gluconate (FERGON) 324 MG tablet Take 324 mg by mouth daily with breakfast.    [provider]  furosemide (LASIX) 40 MG tablet Take 2 tablets (80 mg total) by mouth daily for 5 days. 07/22/21 07/27/21  Carrie Mew, MD  hydrALAZINE (APRESOLINE) 25 MG tablet Take 1 tablet (25 mg total) by mouth 3 (three) times daily. 05/13/21   [name], MD  levothyroxine (SYNTHROID) 100 MCG tablet Take 100 mcg by mouth daily before breakfast.    [provider]  metoprolol succinate (TOPROL-XL) 25 MG 24 hr tablet Take 1 tablet (25 mg total) by mouth daily. 05/13/21   [name], MD  potassium chloride SA (KLOR-CON M) 20 MEQ tablet Take 1 tablet (20 mEq total) by mouth daily. 07/22/21 08/21/21  Carrie Mew, MD  pregabalin (LYRICA) 100 MG capsule Take 100 mg by mouth 2 (two) times daily. 04/28/21   [provider]   Review of Systems  Constitutional:  Positive for fatigue. Negative for appetite change.  HENT:  Positive for congestion. Negative for postnasal drip and sore throat.   Eyes: Negative.   Respiratory:  Positive for shortness of breath (worsening). Negative for cough and chest tightness.   Cardiovascular:  Negative for chest pain, palpitations and leg swelling.  Gastrointestinal:  Positive for abdominal distention (worsening). Negative for abdominal pain.  Endocrine: Negative.   Genitourinary: Negative.   Musculoskeletal:  Positive for arthralgias (left hand) and back pain. Negative for neck pain.  Skin: Negative.   Allergic/Immunologic: Negative.   Neurological:  Negative for dizziness, weakness and light-headedness.  Hematological:  Negative for adenopathy. Does not bruise/bleed easily.  Psychiatric/Behavioral:  Negative for  dysphoric mood and sleep disturbance (sleeping on 1 pillow). The Hector [name] is not nervous/anxious.    Vitals:   07/28/21 1409  BP: (!) 159/124  Pulse: 88  Resp: 18  SpO2: 98%  Weight: 273 lb (123.8 kg)  Height: 5\' 11"  (1.803 m)   Wt Readings from Last 3 Encounters:  07/28/21 273 lb (123.8 kg)  05/23/21 278 lb 2 oz (126.2 kg)  05/13/21 267 lb 8 oz (121.3 kg)   Lab Results  Component Value Date   CREATININE 3.36 (H) 07/28/2021   CREATININE 3.11 (H) 07/22/2021   CREATININE 2.70 (H) 05/06/2021   Physical Exam Vitals and nursing note reviewed.  Constitutional:      General: Hector [name] is not in acute distress.    Appearance: Normal appearance.  HENT:     Head: Normocephalic and atraumatic.  Neck:     Vascular: No JVD.  Cardiovascular:     Rate and Rhythm: Normal rate and regular rhythm.  Pulmonary:     Effort: Pulmonary effort is normal. No respiratory distress.     Breath sounds: No wheezing or rales.  Abdominal:     General: There is distension.     Palpations: Abdomen is soft.  Musculoskeletal:     Cervical back: Normal range of motion and neck supple.     Right lower leg: No edema.     Left lower leg: No edema.  Skin:    General: Skin is warm and dry.  Neurological:     General: No focal deficit present.     Mental Status: Hector [name] is alert and oriented to person, place, and time.     Motor: Weakness present.  Psychiatric:        Mood and Affect: Mood normal.        Behavior: Behavior normal.        Thought Content: Thought content normal.    Assessment & Plan:  1: Acute on Chronic heart failure with reduced ejection fraction- - NYHA Class III - fluid overloaded today with abdominal distention and worsening symptoms - not weighing daily but unclear today as to why - weight down 5 pounds from last visit here 2 months ago - will send for 80mg  IV lasix/ 29meq PO potassium today - check BMP/BNP today - has not taken any of his medications in >48 hours because Hector [name] says that Hector [name]  can't eat due to swelling and Hector [name] has to take his medications with food - on GDMT of farxiga & metoprolol succinate -  renal function will limit other GDMT - not adding salt to his food - saw cardiology Rockey Situ) 05/13/21 - saw EP Quentin Ore) 04/30/21 - BNP 07/22/21 was 1343.0  2: HTN- - BP elevated but Hector [name] hasn't taken any medications in >48 hours and is fluid overloaded; getting IV lasix per above - follows with PCP at [facility] and was there last week - BMP from 07/22/21 reviewed and showed sodium 137, potassium 3.9, creatinine 3.11 and GFR 23 - saw nephrology ([place]) 06/11/21  3: Obstructive sleep apnea- - sleeping well at night  - now has CPAP and says that Hector [name] wears it "most nights" - encouraged to wear it every night and any time Hector [name]'s napping during the day   Hector [name] did not bring his medications nor a list. Each medication was verbally reviewed with the Hector [name] and Hector [name] was encouraged to bring the bottles to every visit to confirm accuracy of list.   Return tomorrow morning for re-evaluation of symptoms.

## 2021-07-28 ENCOUNTER — Ambulatory Visit
Admission: RE | Admit: 2021-07-28 | Discharge: 2021-07-28 | Disposition: A | Discharge status: Home / Self Care | Payer: Medicaid Other | Source: Ambulatory Visit | Attending: Family | Admitting: Family

## 2021-07-28 ENCOUNTER — Other Ambulatory Visit: Payer: Self-pay | Admitting: Family

## 2021-07-28 ENCOUNTER — Ambulatory Visit (HOSPITAL_BASED_OUTPATIENT_CLINIC_OR_DEPARTMENT_OTHER): Payer: Medicaid Other | Admitting: Family

## 2021-07-28 ENCOUNTER — Encounter: Payer: Self-pay | Admitting: Family

## 2021-07-28 VITALS — BP 159/124 | HR 88 | Resp 18 | Ht 71.0 in | Wt 273.0 lb

## 2021-07-28 DIAGNOSIS — I5023 Acute on chronic systolic (congestive) heart failure: Secondary | ICD-10-CM | POA: Insufficient documentation

## 2021-07-28 DIAGNOSIS — R140 Abdominal distension (gaseous): Secondary | ICD-10-CM | POA: Insufficient documentation

## 2021-07-28 DIAGNOSIS — I480 Paroxysmal atrial fibrillation: Secondary | ICD-10-CM | POA: Insufficient documentation

## 2021-07-28 DIAGNOSIS — I252 Old myocardial infarction: Secondary | ICD-10-CM | POA: Insufficient documentation

## 2021-07-28 DIAGNOSIS — N183 Chronic kidney disease, stage 3 unspecified: Secondary | ICD-10-CM | POA: Insufficient documentation

## 2021-07-28 DIAGNOSIS — Z87891 Personal history of nicotine dependence: Secondary | ICD-10-CM | POA: Insufficient documentation

## 2021-07-28 DIAGNOSIS — I429 Cardiomyopathy, unspecified: Secondary | ICD-10-CM | POA: Insufficient documentation

## 2021-07-28 DIAGNOSIS — E785 Hyperlipidemia, unspecified: Secondary | ICD-10-CM | POA: Insufficient documentation

## 2021-07-28 DIAGNOSIS — G4733 Obstructive sleep apnea (adult) (pediatric): Secondary | ICD-10-CM | POA: Insufficient documentation

## 2021-07-28 DIAGNOSIS — R785 Finding of other psychotropic drug in blood: Secondary | ICD-10-CM | POA: Diagnosis not present

## 2021-07-28 DIAGNOSIS — I130 Hypertensive heart and chronic kidney disease with heart failure and stage 1 through stage 4 chronic kidney disease, or unspecified chronic kidney disease: Secondary | ICD-10-CM | POA: Insufficient documentation

## 2021-07-28 DIAGNOSIS — Z8249 Family history of ischemic heart disease and other diseases of the circulatory system: Secondary | ICD-10-CM | POA: Insufficient documentation

## 2021-07-28 DIAGNOSIS — I10 Essential (primary) hypertension: Secondary | ICD-10-CM

## 2021-07-28 LAB — BASIC METABOLIC PANEL
Anion gap: 9 (ref 5–15)
BUN: 45 mg/dL — ABNORMAL HIGH (ref 6–20)
CO2: 22 mmol/L (ref 22–32)
Calcium: 8.9 mg/dL (ref 8.9–10.3)
Chloride: 107 mmol/L (ref 98–111)
Creatinine, Ser: 3.36 mg/dL — ABNORMAL HIGH (ref 0.61–1.24)
GFR, Estimated: 21 mL/min — ABNORMAL LOW (ref >60)
Glucose, Bld: 107 mg/dL — ABNORMAL HIGH (ref 70–99)
Potassium: 4.5 mmol/L (ref 3.5–5.1)
Sodium: 138 mmol/L (ref 135–145)

## 2021-07-28 LAB — BRAIN NATRIURETIC PEPTIDE: B Natriuretic Peptide: 1996.7 pg/mL — ABNORMAL HIGH (ref 0.0–100.0)

## 2021-07-28 MED ORDER — POTASSIUM CHLORIDE CRYS ER 20 MEQ PO TBCR
EXTENDED_RELEASE_TABLET | ORAL | Status: AC
  Administered 2021-07-28: 40 meq via ORAL
  Filled 2021-07-28: qty 2

## 2021-07-28 MED ORDER — FUROSEMIDE 10 MG/ML IJ SOLN: 80.0000 mg | Freq: Once | INTRAMUSCULAR | Status: AC

## 2021-07-28 MED ORDER — SODIUM CHLORIDE FLUSH 0.9 % IV SOLN
INTRAVENOUS | Status: AC
Start: 2021-07-28 — End: 2021-07-28
  Filled 2021-07-28: qty 10

## 2021-07-28 MED ORDER — FUROSEMIDE 10 MG/ML IJ SOLN
INTRAMUSCULAR | Status: AC
  Administered 2021-07-28: 80 mg via INTRAVENOUS
  Filled 2021-07-28: qty 8

## 2021-07-28 MED ORDER — POTASSIUM CHLORIDE CRYS ER 20 MEQ PO TBCR: 40.0000 meq | EXTENDED_RELEASE_TABLET | Freq: Once | ORAL | Status: AC

## 2021-07-28 NOTE — Patient Instructions (Signed)
Resume weighing daily and call for an overnight weight gain of 3 pounds or more or a weekly weight gain of more than 5 pounds.  °

## 2021-07-28 NOTE — Progress Notes (Signed)
IV lasix orders entered

## 2021-07-28 NOTE — Progress Notes (Signed)
Patient arrived to SDS and BP was elevated.  Patient stated "it was probably up because I haven't taken my medications in a couple day."  He said the fluid built up on his stomach has not allowed him eat and he is supposed to take his meds with food.  I educated him on the importance of taking his medications and even if he only took a few crackers that would be ok, but he really needed to take his medications.  He acknowledged and said he would.

## 2021-07-28 NOTE — Progress Notes (Unsigned)
[name], male    DOB: 04/23/1965, 56 y.o.   MRN: [id number]   Mr Haymer is a 56 y/o male with a history of MI, hyperlipidemia, HTN, CKD, atrial fibrillation, alcohol use, cardiomyopathy, and CHF.   TEE report from 03/05/21 reviewed and showed an EF of <20% along with mild/moderate MR but no thrombus. Echo report from 09/13/20 reviewed showed an EF of 25-30% along with moderate MR. Echo done 12/03/16 showed an EF of 45-50% which is an improvement from previous echo which was done on 06/02/16 and showed an EF of 30-35%. Prior echo on 02/21/15 which showed a normalization of his EF which was 50-55% with mild MR. No longer ICD candidate. Prior echo on 03/07/14 showed an EF of 20-25% with mild/mod MR.   Last left heart cath was done 01/09/15.   Was in the ED 07/22/21 due to acute on chronic HF. Given IV lasix and home diuretic increased. and he was released. Was in the ED 05/06/21 due to abdominal pain and GI bleeding. Abdominal CT negative for acute findings. + UTI so antibiotics provided and he was released. ED visit on 04/03/21 for lower back pain. Admitted 03/01/21 due to SOB and abdominal pain/ distention. Found to be in a flutter. Initially given IV lasix with transition to oral diuretics. EP & cardiology consults obtained. Successful cardioversion completed. Discharged after 5 days. Was in the ED 02/26/21 due to lower abdominal pain. Abdominal CT was negative. Admission offered but patient declined and he was released.   He presents today for a follow-up visit with a chief complaint of moderate shortness of breath with minimal exertion. Describes this as chronic in nature and not much different from yesterday. He has associated fatigue, abdominal distention (improving) and chronic pain along with this. He denies any difficulty sleeping, dizziness, palpitations, pedal edema, chest pain, cough or weight gain.   Received 80mg  IV lasix/ 66meq PO potassium yesterday and says that his  abdominal swelling has improved slightly. Still hasn't taken any of his medications in the last 4 days because he says that he can't eat so hasn't taken them. Even when he takes them, he says that he only takes them in the mornings.    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 12/2007 MV: EF 28%, no isch;  b. 8/12 Echo: EF 25-35%; c. 02/2014 Echo: EF 20-25%; d. 12/2014 Cath: minimal CAD; e. 01/2015 Echo: EF 50-55%;  d. 05/2016 Echo: EF 30-35%, diff HK, gr2 DD; e. 11/2016 Echo: EF 45-50%, diff HK.   Chronic combined systolic (congestive) and diastolic (congestive) heart failure ([place])    a. 05/2016 Most recent Echo: EF 30-35%, diff HK, Gr2 DD, mild MR, mod dil LA; b. 11/2016 Echo: EF 45-50%, diff HK.   CKD (chronic kidney disease), stage III (HCC)    Elevated troponin    Chronically elevated. - level was 0.17-0.10 during recent admission   Essential hypertension    GI bleed 11/2013   Hyperlipidemia    Pancreatitis    Paroxysmal A-fib (HCC)    a. new onset s/p unsuccessful TEE/DCCV on 08/16/2014; b. on amio/eliquis (CHA2DS2VASc = 2-3); c. reports 1-2 hrs of afib ~ q81mos.   Paroxysmal atrial flutter ([place])    a. new onset 07/2014; b. s/p unsuccessful TEE/DCCV 08/16/2014; c. on apixaban.   Sleep-disordered breathing    Has yet to have a sleep study   Stroke [facility])  Past Surgical History:  Procedure Laterality Date   CARDIAC CATHETERIZATION N/A 01/09/2015   Procedure: Left Heart Cath and Coronary Angiography;  Surgeon: [name], MD;  Location: [place] CV LAB;  Service: Cardiovascular;  Laterality: N/A;   CARDIOVERSION N/A 03/05/2021   Procedure: CARDIOVERSION;  Surgeon: Kate Sable, MD;  Location: [facility] ORS;  Service: Cardiovascular;  Laterality: N/A;   COLONOSCOPY N/A 07/22/2020   Procedure: COLONOSCOPY;  Surgeon: Toledo, Benay Pike, MD;  Location: [facility] ENDOSCOPY;  Service: Gastroenterology;  Laterality: N/A;    ELECTROPHYSIOLOGIC STUDY N/A 08/16/2014   Procedure: CARDIOVERSION;  Surgeon: [name], MD;  Location: [facility] ORS;  Service: Cardiovascular;  Laterality: N/A;   FLEXIBLE SIGMOIDOSCOPY N/A 10/10/2015   Procedure: FLEXIBLE SIGMOIDOSCOPY;  Surgeon: [name], MD;  Location: [facility] ENDOSCOPY;  Service: Endoscopy;  Laterality: N/A;   KNEE SURGERY Right    NM MYOVIEW LTD  November 2011   No ischemia or infarction. EF 50-55% ( no improvement from 2009 Myoview EF of 28%   TEE WITHOUT CARDIOVERSION N/A 08/16/2014   Procedure: TRANSESOPHAGEAL ECHOCARDIOGRAM (TEE);  Surgeon: [name], MD;  Location: [facility] ORS;  Service: Cardiovascular;  Laterality: N/A;   TEE WITHOUT CARDIOVERSION N/A 03/05/2021   Procedure: TRANSESOPHAGEAL ECHOCARDIOGRAM (TEE);  Surgeon: Kate Sable, MD;  Location: [facility] ORS;  Service: Cardiovascular;  Laterality: N/A;   Family History  Problem Relation Age of Onset   Hypertension Mother    Hyperlipidemia Mother    Diabetes Mother    Social History   Tobacco Use   Smoking status: Former    Packs/day: 1.00    Years: 12.00    Pack years: 12.00    Types: Cigarettes    Quit date: 02/23/1998    Years since quitting: 23.4   Smokeless tobacco: Never  Substance Use Topics   Alcohol use: Not Currently    Alcohol/week: 0.0 standard drinks    Comment: Past heavy drinker   Allergies  Allergen Reactions   Esomeprazole Magnesium Other (See Comments)    Suspected interstitial nephritis 2018   Eggs Or Egg-Derived Products Rash   Prior to Admission medications   Medication Sig Start Date End Date Taking? Authorizing Provider  allopurinol (ZYLOPRIM) 100 MG tablet Take 1 tablet by mouth daily. Taking a 1/2 tablet 02/14/21 02/14/22 Yes [provider]  apixaban (ELIQUIS) 5 MG TABS tablet Take 1 tablet (5 mg total) by mouth 2 (two) times daily. 05/13/21  Yes [name], MD  atorvastatin (LIPITOR) 80 MG tablet Take 1 tablet (80 mg total) by mouth daily.  05/13/21  Yes [name], MD  dapagliflozin propanediol (FARXIGA) 10 MG TABS tablet Take 1 tablet (10 mg total) by mouth daily before breakfast. 05/13/21  Yes Gollan, Kathlene November, MD  ferrous gluconate (FERGON) 324 MG tablet Take 324 mg by mouth daily with breakfast.   Yes [provider]  hydrALAZINE (APRESOLINE) 25 MG tablet Take 1 tablet (25 mg total) by mouth 3 (three) times daily. 05/13/21  Yes Gollan, Kathlene November, MD  levothyroxine (SYNTHROID) 100 MCG tablet Take 100 mcg by mouth daily before breakfast.   Yes [provider]  metoprolol succinate (TOPROL-XL) 25 MG 24 hr tablet Take 1 tablet (25 mg total) by mouth daily. 05/13/21  Yes Gollan, Kathlene November, MD  potassium chloride SA (KLOR-CON M) 20 MEQ tablet Take 1 tablet (20 mEq total) by mouth daily. 07/22/21 08/21/21 Yes Carrie Mew, MD  furosemide (LASIX) 40 MG tablet Take 2 tablets (80 mg total)  by mouth daily for 5 days. Patient taking differently: Take 40 mg by mouth daily. 07/22/21 07/27/21  Carrie Mew, MD  pregabalin (LYRICA) 100 MG capsule Take 100 mg by mouth 2 (two) times daily. Patient not taking: Reported on 07/29/2021 04/28/21   [provider]    Review of Systems  Constitutional:  Positive for fatigue. Negative for appetite change.  HENT:  Positive for congestion. Negative for postnasal drip and sore throat.   Eyes: Negative.   Respiratory:  Positive for shortness of breath (with little exertion). Negative for cough and chest tightness.   Cardiovascular:  Negative for chest pain, palpitations and leg swelling.  Gastrointestinal:  Positive for abdominal distention ("little better"). Negative for abdominal pain.  Endocrine: Negative.   Genitourinary: Negative.   Musculoskeletal:  Positive for arthralgias (left hand) and back pain. Negative for neck pain.  Skin: Negative.   Allergic/Immunologic: Negative.   Neurological:  Negative for dizziness, weakness and light-headedness.  Hematological:   Negative for adenopathy. Does not bruise/bleed easily.  Psychiatric/Behavioral:  Negative for dysphoric mood and sleep disturbance (sleeping on 1 pillow). The patient is not nervous/anxious.    Vitals:   07/29/21 0840  BP: (!) 155/120  Pulse: 86  Resp: 16  SpO2: 100%  Weight: 272 lb (123.4 kg)  Height: 5\' 11"  (1.803 m)   Wt Readings from Last 3 Encounters:  07/29/21 272 lb (123.4 kg)  07/28/21 273 lb (123.8 kg)  05/23/21 278 lb 2 oz (126.2 kg)   Lab Results  Component Value Date   CREATININE 3.36 (H) 07/28/2021   CREATININE 3.11 (H) 07/22/2021   CREATININE 2.70 (H) 05/06/2021    Physical Exam Vitals and nursing note reviewed.  Constitutional:      General: He is not in acute distress.    Appearance: Normal appearance.  HENT:     Head: Normocephalic and atraumatic.  Neck:     Vascular: No JVD.  Cardiovascular:     Rate and Rhythm: Normal rate and regular rhythm.  Pulmonary:     Effort: Pulmonary effort is normal. No respiratory distress.     Breath sounds: No wheezing or rales.  Abdominal:     General: There is distension.     Palpations: Abdomen is soft.     Comments: Softer than yesterday  Musculoskeletal:     Cervical back: Normal range of motion and neck supple.     Right lower leg: No edema.     Left lower leg: No edema.  Skin:    General: Skin is warm and dry.  Neurological:     General: No focal deficit present.     Mental Status: He is alert and oriented to person, place, and time.     Motor: Weakness present.  Psychiatric:        Mood and Affect: Mood normal.        Behavior: Behavior normal.        Thought Content: Thought content normal.    Assessment & Plan:  1: Acute on Chronic heart failure with reduced ejection fraction- - NYHA Class III - continues to be fluid overloaded today with abdominal distention although abdomen is softer than yesterday - not weighing daily but unclear today as to why - weight down 1 pound from last visit here  yesterday - got 80mg  IV lasix/ 59meq PO potassium yesterday - will send back for another 80mg  IV lasix today; will not give oral potassium since his K+ level was 4.5 - has not taken  any of his medications since 07/25/21 (4 days ago) - when he does take his medications, he only takes them in the mornings - on GDMT of farxiga & metoprolol succinate - will stop his furosemide and begin torsemide 20mg  daily; emphasized that it will only help him if he takes it - renal function will limit other GDMT - not adding salt to his food - saw cardiology Rockey Situ) 05/13/21 - saw EP Quentin Ore) 04/30/21 - BNP 07/28/21 was 1996.7 - PharmD reconciled medications with the patient  2: HTN- - BP elevated (155/120) but he hasn't taken any medications in the last 4 days - follows with PCP at [facility] and sees them tomorrow - BMP from 07/28/21 reviewed and showed sodium 138, potassium 4.5, creatinine 3.36 and GFR 21 - saw nephrology ([place]) 06/11/21  3: Obstructive sleep apnea- - sleeping well at night  - now has CPAP and says that he wears it "most nights" - encouraged to wear it every night and any time he's napping during the day   Patient did not bring his medications nor a list. Each medication was verbally reviewed with the patient and he was encouraged to bring the bottles to every visit to confirm accuracy of list.   Return in 3 days, sooner if needed.

## 2021-07-29 ENCOUNTER — Other Ambulatory Visit: Payer: Self-pay | Admitting: Family

## 2021-07-29 ENCOUNTER — Encounter: Payer: Self-pay | Admitting: Family

## 2021-07-29 ENCOUNTER — Ambulatory Visit
Admission: RE | Admit: 2021-07-29 | Discharge: 2021-07-29 | Disposition: A | Discharge status: Home / Self Care | Payer: Medicaid Other | Source: Ambulatory Visit | Attending: Family | Admitting: Family

## 2021-07-29 ENCOUNTER — Ambulatory Visit (HOSPITAL_BASED_OUTPATIENT_CLINIC_OR_DEPARTMENT_OTHER): Payer: Medicaid Other | Admitting: Family

## 2021-07-29 VITALS — BP 155/120 | HR 86 | Resp 16 | Ht 71.0 in | Wt 272.0 lb

## 2021-07-29 DIAGNOSIS — R140 Abdominal distension (gaseous): Secondary | ICD-10-CM | POA: Insufficient documentation

## 2021-07-29 DIAGNOSIS — I5023 Acute on chronic systolic (congestive) heart failure: Secondary | ICD-10-CM

## 2021-07-29 DIAGNOSIS — I5043 Acute on chronic combined systolic (congestive) and diastolic (congestive) heart failure: Secondary | ICD-10-CM | POA: Diagnosis not present

## 2021-07-29 DIAGNOSIS — I130 Hypertensive heart and chronic kidney disease with heart failure and stage 1 through stage 4 chronic kidney disease, or unspecified chronic kidney disease: Secondary | ICD-10-CM | POA: Insufficient documentation

## 2021-07-29 DIAGNOSIS — I252 Old myocardial infarction: Secondary | ICD-10-CM | POA: Insufficient documentation

## 2021-07-29 DIAGNOSIS — I10 Essential (primary) hypertension: Secondary | ICD-10-CM | POA: Diagnosis not present

## 2021-07-29 DIAGNOSIS — Z79899 Other long term (current) drug therapy: Secondary | ICD-10-CM | POA: Insufficient documentation

## 2021-07-29 DIAGNOSIS — E785 Hyperlipidemia, unspecified: Secondary | ICD-10-CM | POA: Insufficient documentation

## 2021-07-29 DIAGNOSIS — G4733 Obstructive sleep apnea (adult) (pediatric): Secondary | ICD-10-CM | POA: Insufficient documentation

## 2021-07-29 DIAGNOSIS — N183 Chronic kidney disease, stage 3 unspecified: Secondary | ICD-10-CM | POA: Insufficient documentation

## 2021-07-29 DIAGNOSIS — I5040 Unspecified combined systolic (congestive) and diastolic (congestive) heart failure: Secondary | ICD-10-CM | POA: Insufficient documentation

## 2021-07-29 LAB — BASIC METABOLIC PANEL
Anion gap: 9 (ref 5–15)
BUN: 46 mg/dL — ABNORMAL HIGH (ref 6–20)
CO2: 21 mmol/L — ABNORMAL LOW (ref 22–32)
Calcium: 8.9 mg/dL (ref 8.9–10.3)
Chloride: 108 mmol/L (ref 98–111)
Creatinine, Ser: 3.08 mg/dL — ABNORMAL HIGH (ref 0.61–1.24)
GFR, Estimated: 23 mL/min — ABNORMAL LOW (ref >60)
Glucose, Bld: 108 mg/dL — ABNORMAL HIGH (ref 70–99)
Potassium: 4.5 mmol/L (ref 3.5–5.1)
Sodium: 138 mmol/L (ref 135–145)

## 2021-07-29 LAB — BRAIN NATRIURETIC PEPTIDE: B Natriuretic Peptide: 1705.9 pg/mL — ABNORMAL HIGH (ref 0.0–100.0)

## 2021-07-29 MED ORDER — FUROSEMIDE 10 MG/ML IJ SOLN
80.0000 mg | Freq: Once | INTRAMUSCULAR | Status: AC
  Administered 2021-07-29: 80 mg via INTRAVENOUS

## 2021-07-29 MED ORDER — TORSEMIDE 20 MG PO TABS: 20.0000 mg | ORAL_TABLET | Freq: Every day | ORAL | 3 refills | Status: DC

## 2021-07-29 NOTE — Progress Notes (Signed)
[facility] - PHARMACIST COUNSELING NOTE  Guideline-Directed Medical Therapy/Evidence Based Medicine  ACE/ARB/ARNI: None Beta Blocker: Metoprolol succinate 25 mg daily Aldosterone Antagonist: None Diuretic: Furosemide 40 mg daily SGLT2i: Dapagliflozin 10 mg daily  Adherence Assessment  Do you ever forget to take your medication? [x] Yes [] No  Do you ever skip doses due to side effects? [] Yes [x] No  Do you have trouble affording your medicines? [] Yes [x] No  Are you ever unable to pick up your medication due to transportation difficulties? [] Yes [x] No  Do you ever stop taking your medications because you don't believe they are helping? [] Yes [x] No  Do you check your weight daily? [] Yes [x] No   Adherence strategy: none. Tries to take with breakfast but not always hungry in the morning  Barriers to obtaining medications: none. Forgets to take medications  Vital signs: HR 86, BP 155/120, weight (pounds) 272 lb ECHO: Date 03/05/2021, EF < 20%, notes left ventricular internal cavity mildly dilated     Latest Ref Rng & Units 07/28/2021    2:46 PM 07/22/2021    9:10 AM 05/06/2021   11:21 AM  BMP  Glucose 70 - 99 mg/dL 107   213   109    BUN 6 - 20 mg/dL 45   43   28    Creatinine 0.61 - 1.24 mg/dL 3.36   3.11   2.70    Sodium 135 - 145 mmol/L 138   137   138    Potassium 3.5 - 5.1 mmol/L 4.5   3.9   4.2    Chloride 98 - 111 mmol/L 107   105   107    CO2 22 - 32 mmol/L 22   24   22     Calcium 8.9 - 10.3 mg/dL 8.9   8.4   8.9      Past Medical History:  Diagnosis Date   Alcohol abuse    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 12/2007 MV: EF 28%, no isch;  b. 8/12 Echo: EF 25-35%; c. 02/2014 Echo: EF 20-25%; d. 12/2014 Cath: minimal CAD; e. 01/2015 Echo: EF 50-55%;  d. 05/2016 Echo: EF 30-35%, diff HK, gr2 DD; e. 11/2016 Echo: EF 45-50%, diff HK.   Chronic combined systolic (congestive) and diastolic (congestive) heart failure ([place])    a.  05/2016 Most recent Echo: EF 30-35%, diff HK, Gr2 DD, mild MR, mod dil LA; b. 11/2016 Echo: EF 45-50%, diff HK.   CKD (chronic kidney disease), stage III (HCC)    Elevated troponin    Chronically elevated. - level was 0.17-0.10 during recent admission   Essential hypertension    GI bleed 11/2013   Hyperlipidemia    Pancreatitis    Paroxysmal A-fib (HCC)    a. new onset s/p unsuccessful TEE/DCCV on 08/16/2014; b. on amio/eliquis (CHA2DS2VASc = 2-3); c. reports 1-2 hrs of afib ~ q20mos.   Paroxysmal atrial flutter ([place])    a. new onset 07/2014; b. s/p unsuccessful TEE/DCCV 08/16/2014; c. on apixaban.   Sleep-disordered breathing    Has yet to have a sleep study   Stroke [facility])     ASSESSMENT 56 year old male who presents to the HF clinic for follow up of HFrEF. Yesterday presented with stomach pain 2/2 fluid overload. Received IV Lasix 80 mg + KCl 40 6/5. K 4.5 6/5. Feeling marginally better. On GDMT of dapagliflozin and hydralazine. Renal function (Scr 3.36) precludes initiation of ARA.  Frequently misses doses of medications  in the morning and afternoon. Last took medications Friday.Taking lasix once daily due to non-adherence. While patient does not eat breakfast every morning, he does report doing other activities at this hour, such as watching TV.  Recent ED Visit (past 6 months): Date - 07/12/21, CC - CHF  PLAN Switch to torsemide 20 mg daily to assist in diuresis throughout the day given extended half life. Receiving IV Lasix again today.  Consider adding isosorbide dinitrate at future visits once diuretic dose adjusted, adherence improved, and blood pressure/HR reassessed.  Counseled on importance of medication adherence. Suggested associating morning activity (e.g. watching TV) with taking medications.   Time spent: 15 minutes  Wynelle Cleveland, PharmD Pharmacy Resident  07/29/2021 9:05 AM   Current Outpatient Medications:    allopurinol (ZYLOPRIM) 100 MG tablet, Take 1 tablet by  mouth daily. Taking a 1/2 tablet, Disp: , Rfl:    apixaban (ELIQUIS) 5 MG TABS tablet, Take 1 tablet (5 mg total) by mouth 2 (two) times daily., Disp: 60 tablet, Rfl: 6   atorvastatin (LIPITOR) 80 MG tablet, Take 1 tablet (80 mg total) by mouth daily., Disp: 90 tablet, Rfl: 3   dapagliflozin propanediol (FARXIGA) 10 MG TABS tablet, Take 1 tablet (10 mg total) by mouth daily before breakfast., Disp: 90 tablet, Rfl: 3   ferrous gluconate (FERGON) 324 MG tablet, Take 324 mg by mouth daily with breakfast., Disp: , Rfl:    furosemide (LASIX) 40 MG tablet, Take 2 tablets (80 mg total) by mouth daily for 5 days. (Patient taking differently: Take 40 mg by mouth daily.), Disp: 10 tablet, Rfl: 0   hydrALAZINE (APRESOLINE) 25 MG tablet, Take 1 tablet (25 mg total) by mouth 3 (three) times daily., Disp: 270 tablet, Rfl: 3   levothyroxine (SYNTHROID) 100 MCG tablet, Take 100 mcg by mouth daily before breakfast., Disp: , Rfl:    metoprolol succinate (TOPROL-XL) 25 MG 24 hr tablet, Take 1 tablet (25 mg total) by mouth daily., Disp: 90 tablet, Rfl: 3   potassium chloride SA (KLOR-CON M) 20 MEQ tablet, Take 1 tablet (20 mEq total) by mouth daily., Disp: 30 tablet, Rfl: 0   pregabalin (LYRICA) 100 MG capsule, Take 100 mg by mouth 2 (two) times daily. (Patient not taking: Reported on 07/29/2021), Disp: , Rfl:    COUNSELING POINTS/CLINICAL PEARLS   DRUGS TO CAUTION IN HEART FAILURE  Drug or Class Mechanism  Analgesics NSAIDs COX-2 inhibitors Glucocorticoids  Sodium and water retention, increased systemic vascular resistance, decreased response to diuretics   Diabetes Medications Metformin Thiazolidinediones Rosiglitazone (Avandia) Pioglitazone (Actos) DPP4 Inhibitors Saxagliptin (Onglyza) Sitagliptin (Januvia)   Lactic acidosis Possible calcium channel blockade   Unknown  Antiarrhythmics Class I  Flecainide Disopyramide Class III Sotalol Other Dronedarone  Negative inotrope,  proarrhythmic   Proarrhythmic, beta blockade  Negative inotrope  Antihypertensives Alpha Blockers Doxazosin Calcium Channel Blockers Diltiazem Verapamil Nifedipine Central Alpha Adrenergics Moxonidine Peripheral Vasodilators Minoxidil  Increases renin and aldosterone  Negative inotrope    Possible sympathetic withdrawal  Unknown  Anti-infective Itraconazole Amphotericin B  Negative inotrope Unknown  Hematologic Anagrelide Cilostazol   Possible inhibition of PD IV Inhibition of PD III causing arrhythmias  Neurologic/Psychiatric Stimulants Anti-Seizure Drugs Carbamazepine Pregabalin Antidepressants Tricyclics Citalopram Parkinsons Bromocriptine Pergolide Pramipexole Antipsychotics Clozapine Antimigraine Ergotamine Methysergide Appetite suppressants Bipolar Lithium  Peripheral alpha and beta agonist activity  Negative inotrope and chronotrope Calcium channel blockade  Negative inotrope, proarrhythmic Dose-dependent QT prolongation  Excessive serotonin activity/valvular damage Excessive serotonin activity/valvular damage Unknown  IgE  mediated hypersensitivy, calcium channel blockade  Excessive serotonin activity/valvular damage Excessive serotonin activity/valvular damage Valvular damage  Direct myofibrillar degeneration, adrenergic stimulation  Antimalarials Chloroquine Hydroxychloroquine Intracellular inhibition of lysosomal enzymes  Urologic Agents Alpha Blockers Doxazosin Prazosin Tamsulosin Terazosin  Increased renin and aldosterone  Adapted from Page Carleene Overlie, et al. "Drugs That May Cause or Exacerbate Heart Failure: A Scientific Statement from the American Heart  Association." Circulation 2016; 134:e32-e69. DOI: 10.1161/CIR.0000000000000426   MEDICATION ADHERENCES TIPS AND STRATEGIES Taking medication as prescribed improves patient outcomes in heart failure (reduces hospitalizations, improves symptoms, increases survival) Side  effects of medications can be managed by decreasing doses, switching agents, stopping drugs, or adding additional therapy. Please let someone in the [facility] know if you have having bothersome side effects so we can modify your regimen. Do not alter your medication regimen without talking to [place].  Medication reminders can help patients remember to take drugs on time. If you are missing or forgetting doses you can try linking behaviors, using pill boxes, or an electronic reminder like an alarm on your phone or an app. Some people can also get automated phone calls as medication reminders.

## 2021-07-29 NOTE — Patient Instructions (Addendum)
Continue weighing daily and call for an overnight weight gain of 3 pounds or more or a weekly weight gain of more than 5 pounds.   If you have voicemail, please make sure your mailbox is cleaned out so that we may leave a message and please make sure to listen to any voicemails.    Do not take anymore furosemide (lasix), you will start taking torsemide 1 tablet every morning.

## 2021-08-01 ENCOUNTER — Ambulatory Visit: Payer: Medicaid Other | Admitting: Family

## 2021-08-18 ENCOUNTER — Other Ambulatory Visit (HOSPITAL_COMMUNITY): Payer: Self-pay

## 2021-08-19 ENCOUNTER — Encounter: Payer: Self-pay | Admitting: Family

## 2021-08-19 ENCOUNTER — Ambulatory Visit: Payer: Medicaid Other | Attending: Family | Admitting: Family

## 2021-08-19 VITALS — BP 127/105 | HR 87 | Resp 16 | Ht 71.0 in | Wt 267.1 lb

## 2021-08-19 DIAGNOSIS — I130 Hypertensive heart and chronic kidney disease with heart failure and stage 1 through stage 4 chronic kidney disease, or unspecified chronic kidney disease: Secondary | ICD-10-CM | POA: Insufficient documentation

## 2021-08-19 DIAGNOSIS — N189 Chronic kidney disease, unspecified: Secondary | ICD-10-CM | POA: Insufficient documentation

## 2021-08-19 DIAGNOSIS — R1030 Lower abdominal pain, unspecified: Secondary | ICD-10-CM

## 2021-08-19 DIAGNOSIS — I5022 Chronic systolic (congestive) heart failure: Secondary | ICD-10-CM

## 2021-08-19 DIAGNOSIS — G4733 Obstructive sleep apnea (adult) (pediatric): Secondary | ICD-10-CM | POA: Diagnosis not present

## 2021-08-19 DIAGNOSIS — I252 Old myocardial infarction: Secondary | ICD-10-CM | POA: Diagnosis not present

## 2021-08-19 DIAGNOSIS — I5040 Unspecified combined systolic (congestive) and diastolic (congestive) heart failure: Secondary | ICD-10-CM | POA: Insufficient documentation

## 2021-08-19 DIAGNOSIS — I4891 Unspecified atrial fibrillation: Secondary | ICD-10-CM | POA: Diagnosis not present

## 2021-08-19 DIAGNOSIS — E785 Hyperlipidemia, unspecified: Secondary | ICD-10-CM | POA: Diagnosis not present

## 2021-08-19 DIAGNOSIS — I429 Cardiomyopathy, unspecified: Secondary | ICD-10-CM | POA: Insufficient documentation

## 2021-08-19 DIAGNOSIS — R109 Unspecified abdominal pain: Secondary | ICD-10-CM | POA: Diagnosis not present

## 2021-08-19 DIAGNOSIS — I10 Essential (primary) hypertension: Secondary | ICD-10-CM | POA: Diagnosis not present

## 2021-08-19 DIAGNOSIS — F1090 Alcohol use, unspecified, uncomplicated: Secondary | ICD-10-CM | POA: Diagnosis not present

## 2021-08-19 LAB — BASIC METABOLIC PANEL
Anion gap: 13 (ref 5–15)
BUN: 39 mg/dL — ABNORMAL HIGH (ref 6–20)
CO2: 22 mmol/L (ref 22–32)
Calcium: 8.5 mg/dL — ABNORMAL LOW (ref 8.9–10.3)
Chloride: 104 mmol/L (ref 98–111)
Creatinine, Ser: 3.17 mg/dL — ABNORMAL HIGH (ref 0.61–1.24)
GFR, Estimated: 22 mL/min — ABNORMAL LOW (ref >60)
Glucose, Bld: 89 mg/dL (ref 70–99)
Potassium: 3.7 mmol/L (ref 3.5–5.1)
Sodium: 139 mmol/L (ref 135–145)

## 2021-09-03 ENCOUNTER — Other Ambulatory Visit (HOSPITAL_COMMUNITY): Payer: Self-pay

## 2021-09-03 NOTE — Progress Notes (Signed)
Muadh states been doing well.  He states switching to torsemide is keeping fluid down.  He states taking everyday and not missing any.  He is not weighing, lost his scale.  He denies any problems such as chest pain, headaches, dizziness or increased shortness of breath.  He has not moved yet, wanting to find a new place to live.  Asked if he needs to talk with social worker and stated yes, will set him up to visit with her on 21st and will get him a scale while he is there.  He stats does not get food stamps, will look at income that day also.  He denies needing anything today.  Will continue to visit for heart failure, diet and medication management   Ladon Applebaum EMT-Paramedic [phone]

## 2021-09-10 ENCOUNTER — Emergency Department: Payer: Medicaid Other

## 2021-09-10 ENCOUNTER — Encounter: Payer: Self-pay | Admitting: Emergency Medicine

## 2021-09-10 ENCOUNTER — Inpatient Hospital Stay
Admission: EM | Admit: 2021-09-10 | Discharge: 2021-09-14 | DRG: 445 | Disposition: A | Discharge status: Home / Self Care | Payer: Medicaid Other | Attending: Internal Medicine | Admitting: Internal Medicine

## 2021-09-10 ENCOUNTER — Other Ambulatory Visit: Payer: Self-pay

## 2021-09-10 DIAGNOSIS — I480 Paroxysmal atrial fibrillation: Secondary | ICD-10-CM | POA: Diagnosis present

## 2021-09-10 DIAGNOSIS — I130 Hypertensive heart and chronic kidney disease with heart failure and stage 1 through stage 4 chronic kidney disease, or unspecified chronic kidney disease: Secondary | ICD-10-CM | POA: Diagnosis present

## 2021-09-10 DIAGNOSIS — Z7901 Long term (current) use of anticoagulants: Secondary | ICD-10-CM

## 2021-09-10 DIAGNOSIS — E669 Obesity, unspecified: Secondary | ICD-10-CM | POA: Diagnosis present

## 2021-09-10 DIAGNOSIS — E039 Hypothyroidism, unspecified: Secondary | ICD-10-CM | POA: Diagnosis present

## 2021-09-10 DIAGNOSIS — R1011 Right upper quadrant pain: Secondary | ICD-10-CM | POA: Diagnosis present

## 2021-09-10 DIAGNOSIS — Z8673 Personal history of transient ischemic attack (TIA), and cerebral infarction without residual deficits: Secondary | ICD-10-CM

## 2021-09-10 DIAGNOSIS — Z6837 Body mass index (BMI) 37.0-37.9, adult: Secondary | ICD-10-CM

## 2021-09-10 DIAGNOSIS — Z79899 Other long term (current) drug therapy: Secondary | ICD-10-CM | POA: Diagnosis not present

## 2021-09-10 DIAGNOSIS — Z79890 Hormone replacement therapy (postmenopausal): Secondary | ICD-10-CM | POA: Diagnosis not present

## 2021-09-10 DIAGNOSIS — I5022 Chronic systolic (congestive) heart failure: Secondary | ICD-10-CM | POA: Diagnosis present

## 2021-09-10 DIAGNOSIS — I4892 Unspecified atrial flutter: Secondary | ICD-10-CM | POA: Diagnosis present

## 2021-09-10 DIAGNOSIS — Z83438 Family history of other disorder of lipoprotein metabolism and other lipidemia: Secondary | ICD-10-CM

## 2021-09-10 DIAGNOSIS — I428 Other cardiomyopathies: Secondary | ICD-10-CM | POA: Diagnosis present

## 2021-09-10 DIAGNOSIS — E785 Hyperlipidemia, unspecified: Secondary | ICD-10-CM | POA: Diagnosis present

## 2021-09-10 DIAGNOSIS — Z888 Allergy status to other drugs, medicaments and biological substances status: Secondary | ICD-10-CM | POA: Diagnosis not present

## 2021-09-10 DIAGNOSIS — N179 Acute kidney failure, unspecified: Secondary | ICD-10-CM | POA: Diagnosis present

## 2021-09-10 DIAGNOSIS — N184 Chronic kidney disease, stage 4 (severe): Secondary | ICD-10-CM | POA: Diagnosis present

## 2021-09-10 DIAGNOSIS — K8020 Calculus of gallbladder without cholecystitis without obstruction: Principal | ICD-10-CM | POA: Diagnosis present

## 2021-09-10 DIAGNOSIS — Z91012 Allergy to eggs: Secondary | ICD-10-CM | POA: Diagnosis not present

## 2021-09-10 DIAGNOSIS — I2510 Atherosclerotic heart disease of native coronary artery without angina pectoris: Secondary | ICD-10-CM | POA: Diagnosis present

## 2021-09-10 DIAGNOSIS — Z87891 Personal history of nicotine dependence: Secondary | ICD-10-CM | POA: Diagnosis not present

## 2021-09-10 DIAGNOSIS — R112 Nausea with vomiting, unspecified: Secondary | ICD-10-CM

## 2021-09-10 DIAGNOSIS — G4733 Obstructive sleep apnea (adult) (pediatric): Secondary | ICD-10-CM | POA: Diagnosis present

## 2021-09-10 DIAGNOSIS — I10 Essential (primary) hypertension: Secondary | ICD-10-CM | POA: Diagnosis present

## 2021-09-10 DIAGNOSIS — Z8249 Family history of ischemic heart disease and other diseases of the circulatory system: Secondary | ICD-10-CM

## 2021-09-10 LAB — URINALYSIS, ROUTINE W REFLEX MICROSCOPIC
Bacteria, UA: NONE SEEN
Bilirubin Urine: NEGATIVE
Glucose, UA: NEGATIVE mg/dL
Ketones, ur: NEGATIVE mg/dL
Nitrite: NEGATIVE
Protein, ur: >=300 mg/dL — AB
Specific Gravity, Urine: 1.01 (ref 1.005–1.030)
WBC, UA: >50 WBC/hpf — ABNORMAL HIGH (ref 0–5)
pH: 5 (ref 5.0–8.0)

## 2021-09-10 LAB — CBC
HCT: 44.2 % (ref 39.0–52.0)
Hemoglobin: 13.8 g/dL (ref 13.0–17.0)
MCH: 25.4 pg — ABNORMAL LOW (ref 26.0–34.0)
MCHC: 31.2 g/dL (ref 30.0–36.0)
MCV: 81.4 fL (ref 80.0–100.0)
Platelets: 272 10*3/uL (ref 150–400)
RBC: 5.43 MIL/uL (ref 4.22–5.81)
RDW: 17.2 % — ABNORMAL HIGH (ref 11.5–15.5)
WBC: 7.8 10*3/uL (ref 4.0–10.5)
nRBC: 0 % (ref 0.0–0.2)

## 2021-09-10 LAB — COMPREHENSIVE METABOLIC PANEL
ALT: 27 U/L (ref 0–44)
AST: 26 U/L (ref 15–41)
Albumin: 3.3 g/dL — ABNORMAL LOW (ref 3.5–5.0)
Alkaline Phosphatase: 116 U/L (ref 38–126)
Anion gap: 10 (ref 5–15)
BUN: 41 mg/dL — ABNORMAL HIGH (ref 6–20)
CO2: 23 mmol/L (ref 22–32)
Calcium: 8.6 mg/dL — ABNORMAL LOW (ref 8.9–10.3)
Chloride: 104 mmol/L (ref 98–111)
Creatinine, Ser: 3.4 mg/dL — ABNORMAL HIGH (ref 0.61–1.24)
GFR, Estimated: 20 mL/min — ABNORMAL LOW (ref >60)
Glucose, Bld: 108 mg/dL — ABNORMAL HIGH (ref 70–99)
Potassium: 4.2 mmol/L (ref 3.5–5.1)
Sodium: 137 mmol/L (ref 135–145)
Total Bilirubin: 3.3 mg/dL — ABNORMAL HIGH (ref 0.3–1.2)
Total Protein: 6.9 g/dL (ref 6.5–8.1)

## 2021-09-10 LAB — LIPASE, BLOOD: Lipase: 25 U/L (ref 11–51)

## 2021-09-10 MED ORDER — POTASSIUM CHLORIDE CRYS ER 20 MEQ PO TBCR
20.0000 meq | EXTENDED_RELEASE_TABLET | Freq: Every day | ORAL | Status: DC
  Administered 2021-09-12 – 2021-09-14 (×3): 20 meq via ORAL
  Filled 2021-09-10 (×3): qty 1

## 2021-09-10 MED ORDER — DAPAGLIFLOZIN PROPANEDIOL 10 MG PO TABS
10.0000 mg | ORAL_TABLET | Freq: Every day | ORAL | Status: DC
  Administered 2021-09-12 – 2021-09-13 (×2): 10 mg via ORAL
  Filled 2021-09-10 (×3): qty 1

## 2021-09-10 MED ORDER — ONDANSETRON HCL 4 MG/2ML IJ SOLN
4.0000 mg | Freq: Four times a day (QID) | INTRAMUSCULAR | Status: DC | PRN
  Administered 2021-09-11: 4 mg via INTRAVENOUS
  Filled 2021-09-10: qty 2

## 2021-09-10 MED ORDER — ATORVASTATIN CALCIUM 20 MG PO TABS
80.0000 mg | ORAL_TABLET | Freq: Every day | ORAL | Status: DC
  Administered 2021-09-12 – 2021-09-14 (×3): 80 mg via ORAL
  Filled 2021-09-10 (×3): qty 4

## 2021-09-10 MED ORDER — MORPHINE SULFATE (PF) 2 MG/ML IV SOLN
2.0000 mg | INTRAVENOUS | Status: DC | PRN
  Filled 2021-09-10: qty 1

## 2021-09-10 MED ORDER — CARVEDILOL 12.5 MG PO TABS
12.5000 mg | ORAL_TABLET | Freq: Two times a day (BID) | ORAL | Status: DC
  Administered 2021-09-11 (×2): 12.5 mg via ORAL
  Filled 2021-09-10: qty 1
  Filled 2021-09-10: qty 2

## 2021-09-10 MED ORDER — ONDANSETRON HCL 4 MG/2ML IJ SOLN
4.0000 mg | Freq: Once | INTRAMUSCULAR | Status: AC
  Administered 2021-09-11: 4 mg via INTRAVENOUS
  Filled 2021-09-10: qty 2

## 2021-09-10 MED ORDER — MORPHINE SULFATE (PF) 4 MG/ML IV SOLN: 4.0000 mg | INTRAVENOUS | Status: DC | PRN

## 2021-09-10 MED ORDER — ACETAMINOPHEN 650 MG RE SUPP: 650.0000 mg | Freq: Four times a day (QID) | RECTAL | Status: DC | PRN

## 2021-09-10 MED ORDER — SODIUM CHLORIDE 0.9 % IV SOLN
1.0000 g | Freq: Once | INTRAVENOUS | Status: AC
  Administered 2021-09-11: 1 g via INTRAVENOUS
  Filled 2021-09-10: qty 10

## 2021-09-10 MED ORDER — ACETAMINOPHEN 325 MG PO TABS: 650.0000 mg | ORAL_TABLET | Freq: Four times a day (QID) | ORAL | Status: DC | PRN

## 2021-09-10 MED ORDER — HYDRALAZINE HCL 25 MG PO TABS
25.0000 mg | ORAL_TABLET | Freq: Three times a day (TID) | ORAL | Status: DC
  Administered 2021-09-11 – 2021-09-14 (×9): 25 mg via ORAL
  Filled 2021-09-10 (×10): qty 1

## 2021-09-10 MED ORDER — FERROUS GLUCONATE 324 (38 FE) MG PO TABS
324.0000 mg | ORAL_TABLET | Freq: Every day | ORAL | Status: DC
  Administered 2021-09-12 – 2021-09-14 (×3): 324 mg via ORAL
  Filled 2021-09-10 (×4): qty 1

## 2021-09-10 MED ORDER — TORSEMIDE 20 MG PO TABS
20.0000 mg | ORAL_TABLET | Freq: Every day | ORAL | Status: DC
  Administered 2021-09-12: 20 mg via ORAL
  Filled 2021-09-10: qty 1

## 2021-09-10 MED ORDER — LEVOTHYROXINE SODIUM 100 MCG PO TABS
100.0000 ug | ORAL_TABLET | Freq: Every day | ORAL | Status: DC
Start: 2021-09-11 — End: 2021-09-14
  Administered 2021-09-11 – 2021-09-14 (×4): 100 ug via ORAL
  Filled 2021-09-10 (×4): qty 1

## 2021-09-10 MED ORDER — ONDANSETRON HCL 4 MG PO TABS: 4.0000 mg | ORAL_TABLET | Freq: Four times a day (QID) | ORAL | Status: DC | PRN

## 2021-09-10 MED ORDER — APIXABAN 5 MG PO TABS
5.0000 mg | ORAL_TABLET | Freq: Two times a day (BID) | ORAL | Status: DC
  Administered 2021-09-11: 5 mg via ORAL
  Filled 2021-09-10: qty 1

## 2021-09-10 NOTE — Assessment & Plan Note (Signed)
Continue levothyroxine 

## 2021-09-10 NOTE — ED Provider Notes (Signed)
[facility] Provider Note    Event Date/Time   First MD Initiated Contact with Patient 09/10/21 1211     (approximate)   History   Abdominal Pain   HPI  Hector [name] is a 56 y.o. male who presents to the ER for evaluation of abdominal distention and right upper quadrant pain.  Feels like his belly has been distended over the past 2 to 3 weeks but started having nausea poor p.o. intake the past few days and now complaining of right upper quadrant epigastric discomfort and pain.  No pain radiating through to his back denies any dysuria.  No diarrhea.  Denies any chest pain or shortness of breath.  No measured temperatures.     Physical Exam   Triage Vital Signs: ED Triage Vitals  Enc Vitals Group     BP 09/10/21 1110 (!) 137/116     Pulse Rate 09/10/21 1110 84     Resp 09/10/21 1110 18     Temp 09/10/21 1110 98.5 F (36.9 C)     Temp Source 09/10/21 1110 Oral     SpO2 09/10/21 1110 95 %     Weight 09/10/21 1044 267 lb 3.2 oz (121.2 kg)     Height 09/10/21 1044 5\' 11"  (1.803 m)     Head Circumference --      Peak Flow --      Pain Score 09/10/21 1044 6     Pain Loc --      Pain Edu? --      Excl. in [place]? --     Most recent vital signs: Vitals:   09/10/21 1330 09/10/21 1405  BP: (!) 137/118 (!) 133/114  Pulse: 86 87  Resp: 18 17  Temp:    SpO2: 95% 98%     Constitutional: Alert  Eyes: Conjunctivae are normal.  Head: Atraumatic. Nose: No congestion/rhinnorhea. Mouth/Throat: Mucous membranes are moist.   Neck: Painless ROM.  Cardiovascular:   Good peripheral circulation. Respiratory: Normal respiratory effort.  No retractions.  Gastrointestinal: Soft without fluid wave does have right upper quadrant tenderness to palpation without guarding or rebound. Musculoskeletal:  no deformity Neurologic:  MAE spontaneously. No gross focal neurologic deficits are appreciated.  Skin:  Skin is warm, dry and intact. No rash  noted. Psychiatric: Mood and affect are normal. Speech and behavior are normal.    ED Results / Procedures / Treatments   Labs (all labs ordered are listed, but only abnormal results are displayed) Labs Reviewed  COMPREHENSIVE METABOLIC PANEL - Abnormal; Notable for the following components:      Result Value   Glucose, Bld 108 (*)    BUN 41 (*)    Creatinine, Ser 3.40 (*)    Calcium 8.6 (*)    Albumin 3.3 (*)    Total Bilirubin 3.3 (*)    GFR, Estimated 20 (*)    All other components within normal limits  CBC - Abnormal; Notable for the following components:   MCH 25.4 (*)    RDW 17.2 (*)    All other components within normal limits  URINALYSIS, ROUTINE W REFLEX MICROSCOPIC - Abnormal; Notable for the following components:   Color, Urine YELLOW (*)    APPearance HAZY (*)    Hgb urine dipstick MODERATE (*)    Protein, ur >=300 (*)    Leukocytes,Ua SMALL (*)    WBC, UA >50 (*)    All other components within normal limits  LIPASE, BLOOD  EKG  RADIOLOGY Please see ED Course for my review and interpretation.  I personally reviewed all radiographic images ordered to evaluate for the above acute complaints and reviewed radiology reports and findings.  These findings were personally discussed with the patient.  Please see medical record for radiology report.    PROCEDURES:  Critical Care performed: no  Procedures   MEDICATIONS ORDERED IN ED: Medications  morphine (PF) 4 MG/ML injection 4 mg (has no administration in time range)  ondansetron (ZOFRAN) injection 4 mg (has no administration in time range)     IMPRESSION / MDM / ASSESSMENT AND PLAN / ED COURSE  I reviewed the triage vital signs and the nursing notes.                              Differential diagnosis includes, but is not limited to, enteritis, SBO, gastritis, cholelithiasis, cholecystitis, pancreatitis, ascites,  Patient presented to the ER for evaluation of symptoms as described above.  He  is afebrile nontoxic describing worsening abdominal pain and distention as described above.  This presenting complaint could reflect a potentially life-threatening illness therefore the patient will be placed on continuous pulse oximetry and telemetry for monitoring.  Laboratory evaluation will be sent to evaluate for the above complaints.  Given his comorbidities and history CT imaging will be ordered for the above differential   Clinical Course as of 09/10/21 1601  Wed Sep 10, 2021  1442 Discussed the results of ultrasound and concern for cholecystitis.  Patient.  I discussed the case in consultation with Dr. Hampton Abbot of general surgery who has recommended MRCP given concern for possible biliary obstruction prior to operative management to further determine appropriate care going forward.  Patient is complaining of some worsening pain in the right upper quadrant will order IV morphine as well as Zofran.  Patient will be signed out to oncoming physician pending follow-up MRCP. [PR]    Clinical Course User Index [PR] Merlyn Lot, MD    FINAL CLINICAL IMPRESSION(S) / ED DIAGNOSES   Final diagnoses:  Right upper quadrant abdominal pain     Rx / DC Orders   ED Discharge Orders     None        Note:  This document was prepared using Dragon voice recognition software and may include unintentional dictation errors.    Merlyn Lot, MD 09/10/21 1601

## 2021-09-10 NOTE — H&P (Signed)
History and Physical    Patient: Hector [name] VZD:[id number] DOB: 1965-05-20 DOA: 09/10/2021 DOS: the patient was seen and examined on 09/10/2021 PCP: [name], MD  Patient coming from: Home  Chief Complaint:  Chief Complaint  Patient presents with   Abdominal Pain    HPI: Hector [name] is a 56 y.o. male with medical history significant for Nonobstructive CAD, systolic CHF,nonischemic cardiomyopathy related to remote alcohol use, with last EF <20% on TEE 02/2021, paroxysmal A. flutter on Eliquis, CKD lllb,  hx of GI bleed 2015, OSA, Presents to the emergency room with Right upper quadrantAbdominal pain for the past 2  right upper quadrant distention for the past few days associated with  nausea.  He denies fever, chills and diarrhea he does have a bit of dysuria.  He denies chest pain or shortness of breath. ED course and data review: BP elevated to 295/188 with diastolics going as high as 127.  Vitals otherwise unremarkable. Labs: WBC 7.8 hemoglobin 13.8.  CMP with creatinine at baseline at 3.4.  LFTs within normal limits except for elevated total bilirubin of 3.3.  Lipase normal at 25.  Urinalysis with negative nitrite small leukocyte esterase and no bacteria.  WBC over 50. Imaging CT abdomen and pelvis was concerning for acute cholecystitis Right upper quadrant ultrasound showed cholelithiasis and a prominent CBD of 8 mm with no ductal stone MRCP showed the following: IMPRESSION: 1. Cholelithiasis. No gallbladder wall thickening but there is small amount of pericholecystic fluid. Nuclear medicine hepatobiliary scan may be helpful to assess cystic duct patency. 2. Normal caliber and course of the common bile duct. No common bile duct stones. 3. No other acute abdominal findings, mass lesions or adenopathy. 4. Numerous bilateral renal cysts.   The ED provider spoke with Dr. Hampton Abbot who recommended a p.o. challenge.  Patient was given the p.o. challenge but  started vomiting.  Decision made to admit to hospitalist service.  Recommendation for HIDA scan needed. Patient was given a dose of Rocephin for possible UTI.    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 12/2007 MV: EF 28%, no isch;  b. 8/12 Echo: EF 25-35%; c. 02/2014 Echo: EF 20-25%; d. 12/2014 Cath: minimal CAD; e. 01/2015 Echo: EF 50-55%;  d. 05/2016 Echo: EF 30-35%, diff HK, gr2 DD; e. 11/2016 Echo: EF 45-50%, diff HK.   Chronic combined systolic (congestive) and diastolic (congestive) heart failure ([place])    a. 05/2016 Most recent Echo: EF 30-35%, diff HK, Gr2 DD, mild MR, mod dil LA; b. 11/2016 Echo: EF 45-50%, diff HK.   CKD (chronic kidney disease), stage III (HCC)    Elevated troponin    Chronically elevated. - level was 0.17-0.10 during recent admission   Essential hypertension    GI bleed 11/2013   Hyperlipidemia    Pancreatitis    Paroxysmal A-fib (HCC)    a. new onset s/p unsuccessful TEE/DCCV on 08/16/2014; b. on amio/eliquis (CHA2DS2VASc = 2-3); c. reports 1-2 hrs of afib ~ q41mos.   Paroxysmal atrial flutter ([place])    a. new onset 07/2014; b. s/p unsuccessful TEE/DCCV 08/16/2014; c. on apixaban.   Sleep-disordered breathing    Has yet to have a sleep study   Stroke [facility])    Past Surgical History:  Procedure Laterality Date   CARDIAC CATHETERIZATION N/A 01/09/2015   Procedure: Left Heart Cath and Coronary Angiography;  Surgeon: [name], MD;  Location:  [facility];  Service: Cardiovascular;  Laterality: N/A;   CARDIOVERSION N/A 03/05/2021   Procedure: CARDIOVERSION;  Surgeon: Kate Sable, MD;  Location: [facility];  Service: Cardiovascular;  Laterality: N/A;   COLONOSCOPY N/A 07/22/2020   Procedure: COLONOSCOPY;  Surgeon: Toledo, Benay Pike, MD;  Location: [facility];  Service: Gastroenterology;  Laterality: N/A;   ELECTROPHYSIOLOGIC STUDY N/A 08/16/2014   Procedure: CARDIOVERSION;  Surgeon:  [name], MD;  Location: [facility];  Service: Cardiovascular;  Laterality: N/A;   FLEXIBLE SIGMOIDOSCOPY N/A 10/10/2015   Procedure: FLEXIBLE SIGMOIDOSCOPY;  Surgeon: [name], MD;  Location: [facility] ENDOSCOPY;  Service: Endoscopy;  Laterality: N/A;   KNEE SURGERY Right    NM MYOVIEW LTD  November 2011   No ischemia or infarction. EF 50-55% ( no improvement from 2009 Myoview EF of 28%   TEE WITHOUT CARDIOVERSION N/A 08/16/2014   Procedure: TRANSESOPHAGEAL ECHOCARDIOGRAM (TEE);  Surgeon: [name], MD;  Location: [facility];  Service: Cardiovascular;  Laterality: N/A;   TEE WITHOUT CARDIOVERSION N/A 03/05/2021   Procedure: TRANSESOPHAGEAL ECHOCARDIOGRAM (TEE);  Surgeon: Kate Sable, MD;  Location: [facility];  Service: Cardiovascular;  Laterality: N/A;   Social History:  reports that he quit smoking about 23 years ago. His smoking use included cigarettes. He has a 12.00 pack-year smoking history. He has never used smokeless tobacco. He reports that he does not currently use alcohol. He reports that he does not use drugs.  Allergies  Allergen Reactions   Esomeprazole Magnesium Other (See Comments)    Suspected interstitial nephritis 2018   Eggs Or Egg-Derived Products Rash    Family History  Problem Relation Age of Onset   Hypertension Mother    Hyperlipidemia Mother    Diabetes Mother     Prior to Admission medications   Medication Sig Start Date End Date Taking? Authorizing Provider  allopurinol (ZYLOPRIM) 100 MG tablet Take 1 tablet by mouth daily. Taking a 1/2 tablet 02/14/21 02/14/22 Yes [provider]  apixaban (ELIQUIS) 5 MG TABS tablet Take 1 tablet (5 mg total) by mouth 2 (two) times daily. 05/13/21  Yes [name], MD  atorvastatin (LIPITOR) 80 MG tablet Take 1 tablet (80 mg total) by mouth daily. 05/13/21  Yes Gollan, Kathlene November, MD  carvedilol (COREG) 12.5 MG tablet Take 12.5 mg by mouth 2 (two) times daily with a meal.   Yes [provider]  dapagliflozin propanediol (FARXIGA) 10 MG TABS tablet Take 1 tablet (10 mg total) by mouth daily before breakfast. 05/13/21  Yes Gollan, Kathlene November, MD  ferrous gluconate (FERGON) 324 MG tablet Take 324 mg by mouth daily with breakfast.   Yes [provider]  hydrALAZINE (APRESOLINE) 25 MG tablet Take 1 tablet (25 mg total) by mouth 3 (three) times daily. 05/13/21  Yes Gollan, Kathlene November, MD  levothyroxine (SYNTHROID) 100 MCG tablet Take 100 mcg by mouth daily before breakfast.   Yes [provider]  torsemide (DEMADEX) 20 MG tablet Take 1 tablet (20 mg total) by mouth daily. 07/29/21  Yes Hackney, Otila Kluver A, FNP  potassium chloride SA (KLOR-CON M) 20 MEQ tablet Take 1 tablet (20 mEq total) by mouth daily. 07/22/21 09/03/21  Carrie Mew, MD    Physical Exam: Vitals:   09/10/21 1405 09/10/21 1500 09/10/21 1700 09/10/21 2022  BP: (!) 133/114 (!) 133/114 (!) 137/118 (!) 143/127  Pulse: 87 89 87 94  Resp: 17 17 18 20   Temp:   98.6 F (37 C) 97.9  F (36.6 C)  TempSrc:    Oral  SpO2: 98% 98% 99% 93%  Weight:      Height:       Physical Exam Vitals and nursing note reviewed.  Constitutional:      General: He is not in acute distress. HENT:     Head: Normocephalic and atraumatic.  Cardiovascular:     Rate and Rhythm: Normal rate and regular rhythm.     Heart sounds: Normal heart sounds.  Pulmonary:     Effort: Pulmonary effort is normal.     Breath sounds: Normal breath sounds.  Abdominal:     Palpations: Abdomen is soft.     Tenderness: There is no abdominal tenderness.  Neurological:     Mental Status: Mental status is at baseline.     Labs on Admission: I have personally reviewed following labs and imaging studies  CBC: Recent Labs  Lab 09/10/21 1115  WBC 7.8  HGB 13.8  HCT 44.2  MCV 81.4  PLT 569   Basic Metabolic Panel: Recent Labs  Lab 09/10/21 1115  NA 137  K 4.2  CL 104  CO2 23  GLUCOSE 108*  BUN 41*  CREATININE 3.40*   CALCIUM 8.6*   GFR: Estimated Creatinine Clearance: 31.8 mL/min (A) (by C-G formula based on SCr of 3.4 mg/dL (H)). Liver Function Tests: Recent Labs  Lab 09/10/21 1115  AST 26  ALT 27  ALKPHOS 116  BILITOT 3.3*  PROT 6.9  ALBUMIN 3.3*   Recent Labs  Lab 09/10/21 1115  LIPASE 25   No results for input(s): "AMMONIA" in the last 168 hours. Coagulation Profile: No results for input(s): "INR", "PROTIME" in the last 168 hours. Cardiac Enzymes: No results for input(s): "CKTOTAL", "CKMB", "CKMBINDEX", "TROPONINI" in the last 168 hours. BNP (last 3 results) No results for input(s): "PROBNP" in the last 8760 hours. HbA1C: No results for input(s): "HGBA1C" in the last 72 hours. CBG: No results for input(s): "GLUCAP" in the last 168 hours. Lipid Profile: No results for input(s): "CHOL", "HDL", "LDLCALC", "TRIG", "CHOLHDL", "LDLDIRECT" in the last 72 hours. Thyroid Function Tests: No results for input(s): "TSH", "T4TOTAL", "FREET4", "T3FREE", "THYROIDAB" in the last 72 hours. Anemia Panel: No results for input(s): "VITAMINB12", "FOLATE", "FERRITIN", "TIBC", "IRON", "RETICCTPCT" in the last 72 hours. Urine analysis:    Component Value Date/Time   COLORURINE YELLOW (A) 09/10/2021 1302   APPEARANCEUR HAZY (A) 09/10/2021 1302   APPEARANCEUR Clear 02/26/2014 1126   LABSPEC 1.010 09/10/2021 1302   LABSPEC 1.008 02/26/2014 1126   PHURINE 5.0 09/10/2021 1302   GLUCOSEU NEGATIVE 09/10/2021 1302   GLUCOSEU Negative 02/26/2014 1126   HGBUR MODERATE (A) 09/10/2021 1302   BILIRUBINUR NEGATIVE 09/10/2021 1302   BILIRUBINUR Negative 02/26/2014 1126   KETONESUR NEGATIVE 09/10/2021 1302   PROTEINUR >=300 (A) 09/10/2021 1302   NITRITE NEGATIVE 09/10/2021 1302   LEUKOCYTESUR SMALL (A) 09/10/2021 1302   LEUKOCYTESUR 1+ 02/26/2014 1126    Radiological Exams on Admission: MR ABDOMEN MRCP WO CONTRAST  Result Date: 09/10/2021 CLINICAL DATA:  Abdominal distension and right upper quadrant  abdominal pain. Cholelithiasis seen on ultrasound examination. EXAM: MRI ABDOMEN WITHOUT CONTRAST  (INCLUDING MRCP) TECHNIQUE: Multiplanar multisequence MR imaging of the abdomen was performed. Heavily T2-weighted images of the biliary and pancreatic ducts were obtained, and three-dimensional MRCP images were rendered by post processing. COMPARISON:  CT and ultrasound examinations, same date. FINDINGS: Lower chest: The lung bases are grossly clear. No pleural or pericardial effusion. Hepatobiliary: No hepatic lesions  are identified without contrast. No intrahepatic biliary dilatation. Small layering gallstones are noted the gallbladder. No gallbladder wall thickening is identified. There is a small amount of pericholecystic fluid noted. Normal caliber and course of the common bile duct. No common bile duct stones. Pancreas: No mass, inflammation or ductal dilatation. Prominent fatty interstices noted. Spleen:  Normal size.  No focal lesions. Adrenals/Urinary Tract:  The adrenal glands are normal. Bilateral renal cysts.  No hydronephrosis. Stomach/Bowel: The stomach, duodenum, small bowel and colon are unremarkable. Vascular/Lymphatic: The aorta is normal in caliber. No mesenteric or retroperitoneal mass or adenopathy. Other:  No ascites or abdominal wall hernia. Musculoskeletal: No significant bony findings. IMPRESSION: 1. Cholelithiasis. No gallbladder wall thickening but there is small amount of pericholecystic fluid. Nuclear medicine hepatobiliary scan may be helpful to assess cystic duct patency. 2. Normal caliber and course of the common bile duct. No common bile duct stones. 3. No other acute abdominal findings, mass lesions or adenopathy. 4. Numerous bilateral renal cysts. Electronically Signed   By: Marijo Sanes M.D.   On: 09/10/2021 21:26   US ABDOMEN LIMITED RUQ (LIVER/GB)  Result Date: 09/10/2021 CLINICAL DATA:  Right upper quadrant pain over the last 2-3 weeks. EXAM: ULTRASOUND ABDOMEN LIMITED RIGHT  UPPER QUADRANT COMPARISON:  CT same day FINDINGS: Gallbladder: Gallstones, the largest measuring 12 mm. Wall thickening measuring 4 mm. Tiny amount of pericholecystic fluid. No sonographic Murphy sign. None the less, the findings are worrisome for cholecystitis. Common bile duct: Diameter: 8 mm, dilated.  No visible stone. Liver: Increased echogenicity suggesting fatty change. No focal lesion. Portal vein is patent on color Doppler imaging with normal direction of blood flow towards the liver. Other: None. IMPRESSION: Chololithiasis. Largest stone 12 mm. Gallbladder wall thickening at 4 mm. Small amount of pericholecystic fluid. Findings worrisome for acute cholecystitis. Consider nuclear medicine study if concern persists. Prominent common bile duct at 8 mm diameter. No ductal stone is identified. Electronically Signed   By: Nelson Chimes M.D.   On: 09/10/2021 14:28   CT ABDOMEN PELVIS WO CONTRAST  Result Date: 09/10/2021 CLINICAL DATA:  Acute, nonlocalized abdominal pain with bowel obstruction suspected EXAM: CT ABDOMEN AND PELVIS WITHOUT CONTRAST TECHNIQUE: Multidetector CT imaging of the abdomen and pelvis was performed following the standard protocol without IV contrast. RADIATION DOSE REDUCTION: This exam was performed according to the departmental dose-optimization program which includes automated exposure control, adjustment of the mA and/or kV according to patient size and/or use of iterative reconstruction technique. COMPARISON:  05/06/2021 FINDINGS: Lower chest: Calcified nodules in the right lower lobe as previously noted. Interlobular septal thickening at the lateral costophrenic sulci, possibly scarring given stability from prior. Hepatobiliary: No focal liver abnormality.Prominent size of the caudate lobe with surface lobulation. The umbilical vein is also conspicuous. Stranding at the hepatic hilum adjacent to the gallbladder which is only moderately distended but does contain calculi. Pancreas:  Generalized fatty atrophy Spleen: Unremarkable. Adrenals/Urinary Tract: Negative adrenals. No hydronephrosis or stone. Bilateral renal cysts measuring up to 5.1 cm at the right lower pole and on the left up to 4 cm at the upper pole. Subcentimeter presumed hemorrhagic cyst in the interpolar left kidney, interval circumferential thick walled bladder, chronic. Stomach/Bowel: No obstruction. Right upper quadrant inflammation but not associated with thickening of adjacent stomach or duodenum. Vascular/Lymphatic: No acute vascular abnormality. Atherosclerosis. No mass or adenopathy. Reproductive:No pathologic findings. Other: Trace pelvic fluid. No pneumoperitoneum. Fatty right groin and umbilical hernias. Musculoskeletal: No acute abnormalities. IMPRESSION: 1. Right upper  quadrant fat stranding in close proximity to the gallbladder which contains calculi, question symptoms of cholecystitis. 2. Possible cirrhosis, please correlate for risk factors. 3. Fatty umbilical and right groin hernias 4. Atherosclerosis. Electronically Signed   By: Jorje Guild M.D.   On: 09/10/2021 12:58     Data Reviewed: Relevant notes from primary care and specialist visits, past discharge summaries as available in EHR, including Care Everywhere. Prior diagnostic testing as pertinent to current admission diagnoses Updated medications and problem lists for reconciliation ED course, including vitals, labs, imaging, treatment and response to treatment Triage notes, nursing and pharmacy notes and ED provider's notes Notable results as noted in HPI   Assessment and Plan: * Right upper quadrant pain Nausea and vomiting CT abdomen and pelvis, right upper quadrant ultrasound and MRCP nondiagnostic for acute cholecystitis HIDA scan ordered for the a.m. per recommendation of surgeon, Dr. Hampton Abbot who was consulted by the ED N.p.o. from midnight Pain control, IV antiemetics, gentle IV hydration  Nonischemic cardiomyopathy: EF  20-25% TEE January 2023 with EF less than 20% Monitor for fluid overload with IV hydration for recurrent vomiting IV hydralazine, metoprolol and Lasix while n.p.o., to resume home carvedilol, hydralazine and torsemide and back to regular oral diet  Hypothyroidism Continue levothyroxine  CKD (chronic kidney disease) stage 4, GFR 15-29 ml/min (HCC) Renal function at baseline  Obstructive sleep apnea CPAP  Essential hypertension Hydralazine IV as needed while n.p.o.  Paroxysmal atrial fibrillation (HCC) Continue apixaban and Coreg Patient has a history of rapid a flutter and underwent TEE cardioversion in January 2023     DVT prophylaxis: Apixaban  Consults: Surgery, Dr. Hampton Abbot  Advance Care Planning:   Code Status: Prior   Family Communication: none  Disposition Plan: Back to previous home environment  Severity of Illness: The appropriate patient status for this patient is INPATIENT. Inpatient status is judged to be reasonable and necessary in order to provide the required intensity of service to ensure the patient's safety. The patient's presenting symptoms, physical exam findings, and initial radiographic and laboratory data in the context of their chronic comorbidities is felt to place them at high risk for further clinical deterioration. Furthermore, it is not anticipated that the patient will be medically stable for discharge from the hospital within 2 midnights of admission.   * I certify that at the point of admission it is my clinical judgment that the patient will require inpatient hospital care spanning beyond 2 midnights from the point of admission due to high intensity of service, high risk for further deterioration and high frequency of surveillance required.*  Author: Athena Masse, MD 09/10/2021 11:11 PM  For on call review [email or web address].

## 2021-09-10 NOTE — ED Triage Notes (Signed)
First Nurse Note:  Arrives via [facility] from home. C/O generalized abdominal pain  x 2-3 weeks.  Has Hx CKD.  Takes fluid pills.  Also c/o abdominal distention x 2 days.  Per report patient states he has not taken any of his medications since Friday.  Per report BP elevated.

## 2021-09-10 NOTE — Assessment & Plan Note (Signed)
Renal function at baseline 

## 2021-09-10 NOTE — Assessment & Plan Note (Signed)
Hydralazine IV as needed while n.p.o. 

## 2021-09-10 NOTE — Assessment & Plan Note (Addendum)
Continue apixaban and Coreg Patient has a history of rapid a flutter and underwent TEE cardioversion in January 2023

## 2021-09-10 NOTE — ED Provider Notes (Signed)
Patient is signed out to me pending MRCP.  In brief he is a 56 year old with history of CHF with EF less than 20% presenting with abdominal pain and distention.  CT concerning for possible cholecystitis and ultrasound somewhat equivocal with some gallbladder wall thickening pericholecystic fluid but negative Murphy sign CBD somewhat dilated.  T. bili somewhat elevated so Dr. Hampton Abbot with general surgery recommended MRCP.  MRCP was somewhat delayed but finally completed and is also somewhat equivocal with cholelithiasis and some pericholecystic fluid no choledocholithiasis.  On reassessment patient is resting comfortably does endorse some ongoing pain and does have some mild tenderness in the right upper quadrant.  My suspicion is that the pericholecystic fluid is in the setting of his volume overload. Dr. Hampton Abbot agreeable for challenge and if patient feeling okay could be discharged however if pain worsens or develops emesis he will need admission for HIDA scan.  Patient did the p.o. challenge and had emesis and worsening pain.  Will admit to the hospitalist for HIDA scan.  If positive for cholecystitis will need a PERC [place] likely given his comorbidities.   Rada Hay, MD 09/10/21 2245

## 2021-09-10 NOTE — Consult Note (Signed)
Date of Consultation:  09/10/2021  Requesting Physician:  Merlyn Lot, MD  Reason for Consultation:  RUQ abdominal pain  History of Present Illness: Hector [name] is a 56 y.o. male presenting for evaluation of RUQ abdominal pain.  The patient reports feeling more bloated/distended for the past two weeks and started recently having RUQ pain with nausea over the past couple of days.  Denies any fevers, or chills.  In the ED, he has had extensive workup which had some positive and some equivocal findings.  Labwork overall showed a normal WBC of 7.8, but with an elevated total bilirubin of 3.3 despite of normal AST/ALT and Alk Phos.  He has mildly worsened Cr compared to last month, with Cr 3.40.  Initially had a CT scan which showed cholelithiasis, with some stranding in the hepatic hilum.  U/S was then obtained which showed cholelithiasis with mild wall thickening of 4 mm and trace pericholecystic fluid.  His CBD was dilated to 8 mm.  Given this, I recommended an MRCP for further evaluation.  There is cholelithiasis but no gallbladder wall thickening and only a small amount of pericholecystic fluid, and no choledocholithiasis.  He was tried on po challenge in the ED and proceeded to have further nausea and emesis. Patient is being admitted to medical team for further evaluation.  Of note, the patient has significant comorbidities.  He has history of non-ischemic cardiomyopathy, with EF of < 20% on 03/05/21, paroxysmal atrial fibrillation, CHF, HTN, CAD, history of alcohol abuse.  He is currently on Eliquis.  He was recently admitted on 07/22/21 with CHF exacerbation.  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 12/2007 MV: EF 28%, no isch;  b. 8/12 Echo: EF 25-35%; c. 02/2014 Echo: EF 20-25%; d. 12/2014 Cath: minimal CAD; e. 01/2015 Echo: EF 50-55%;  d. 05/2016 Echo: EF 30-35%, diff HK, gr2 DD; e. 11/2016 Echo: EF  45-50%, diff HK.   Chronic combined systolic (congestive) and diastolic (congestive) heart failure ([place])    a. 05/2016 Most recent Echo: EF 30-35%, diff HK, Gr2 DD, mild MR, mod dil LA; b. 11/2016 Echo: EF 45-50%, diff HK.   CKD (chronic kidney disease), stage III (HCC)    Elevated troponin    Chronically elevated. - level was 0.17-0.10 during recent admission   Essential hypertension    GI bleed 11/2013   Hyperlipidemia    Pancreatitis    Paroxysmal A-fib (HCC)    a. new onset s/p unsuccessful TEE/DCCV on 08/16/2014; b. on amio/eliquis (CHA2DS2VASc = 2-3); c. reports 1-2 hrs of afib ~ q49mo.   [place]ox[place] fl[place]    a. new onset 07/2014; b. s/p unsuccessful TEE/DCCV 08/16/2014; c. on apixaban.   Sleep-disordered breathing    Has yet to have a sleep study   Stroke ([facility]      Past Surgical History: Past Surgical History:  Procedure Laterality Date   CARDIAC CATHETERIZATION N/A 01/09/2015   Procedure: Left Heart Cath and Coronary Angiography;  Surgeon: DLeonie Man MD;  Location: [facility];  Service: Cardiovascular;  Laterality: N/A;   CARDIOVERSION N/A 03/05/2021   Procedure: CARDIOVERSION;  Surgeon: AKate Sable MD;  Location: [facility]S;  Service: Cardiovascular;  Laterality: N/A;   COLONOSCOPY N/A 07/22/2020   Procedure: COLONOSCOPY;  Surgeon: Toledo, TBenay Pike MD;  Location: [facility] ENDOSCOPY;  Service: Gastroenterology;  Laterality: N/A;   ELECTROPHYSIOLOGIC STUDY N/A 08/16/2014  Procedure: CARDIOVERSION;  Surgeon: [name], MD;  Location: [facility] ORS;  Service: Cardiovascular;  Laterality: N/A;   FLEXIBLE SIGMOIDOSCOPY N/A 10/10/2015   Procedure: FLEXIBLE SIGMOIDOSCOPY;  Surgeon: [name], MD;  Location: [facility]OSCOPY;  Service: Endoscopy;  Laterality: N/A;   KNEE SURGERY Right    NM MYOVIEW LTD  November 2011   No ischemia or infarction. EF 50-55% ( no improvement from 2009 Myoview EF of 28%   TEE WITHOUT CARDIOVERSION N/A 08/16/2014    Procedure: TRANSESOPHAGEAL ECHOCARDIOGRAM (TEE);  Surgeon: [name], MD;  Location: [facility] ORS;  Service: Cardiovascular;  Laterality: N/A;   TEE WITHOUT CARDIOVERSION N/A 03/05/2021   Procedure: TRANSESOPHAGEAL ECHOCARDIOGRAM (TEE);  Surgeon: Kate Sable, MD;  Location: [facility] ORS;  Service: Cardiovascular;  Laterality: N/A;    Home Medications: Prior to Admission medications   Medication Sig Start Date End Date Taking? Authorizing Provider  allopurinol (ZYLOPRIM) 100 MG tablet Take 1 tablet by mouth daily. Taking a 1/2 tablet 02/14/21 02/14/22  [provider]  apixaban (ELIQUIS) 5 MG TABS tablet Take 1 tablet (5 mg total) by mouth 2 (two) times daily. 05/13/21   [name], MD  atorvastatin (LIPITOR) 80 MG tablet Take 1 tablet (80 mg total) by mouth daily. 05/13/21   [name], MD  carvedilol (COREG) 12.5 MG tablet Take 12.5 mg by mouth 2 (two) times daily with a meal.    [provider]  dapagliflozin propanediol (FARXIGA) 10 MG TABS tablet Take 1 tablet (10 mg total) by mouth daily before breakfast. 05/13/21   Gollan, Kathlene November, MD  ferrous gluconate (FERGON) 324 MG tablet Take 324 mg by mouth daily with breakfast.    [provider]  hydrALAZINE (APRESOLINE) 25 MG tablet Take 1 tablet (25 mg total) by mouth 3 (three) times daily. 05/13/21   [name], MD  levothyroxine (SYNTHROID) 100 MCG tablet Take 100 mcg by mouth daily before breakfast.    [provider]  potassium chloride SA (KLOR-CON M) 20 MEQ tablet Take 1 tablet (20 mEq total) by mouth daily. 07/22/21 09/03/21  Carrie Mew, MD  torsemide (DEMADEX) 20 MG tablet Take 1 tablet (20 mg total) by mouth daily. 07/29/21   Alisa Graff, FNP    Allergies: Allergies  Allergen Reactions   Esomeprazole Magnesium Other (See Comments)    Suspected interstitial nephritis 2018   Eggs Or Egg-Derived Products Rash    Social History:  reports that he quit smoking about 23  years ago. His smoking use included cigarettes. He has a 12.00 pack-year smoking history. He has never used smokeless tobacco. He reports that he does not currently use alcohol. He reports that he does not use drugs.   Family History: Family History  Problem Relation Age of Onset   Hypertension Mother    Hyperlipidemia Mother    Diabetes Mother     Review of Systems: Review of Systems  Constitutional:  Negative for chills and fever.  HENT:  Negative for hearing loss.   Respiratory:  Positive for shortness of breath.   Cardiovascular:  Negative for chest pain.  Gastrointestinal:  Positive for abdominal pain, nausea and vomiting.  Genitourinary:  Negative for dysuria.  Musculoskeletal:  Negative for myalgias.  Skin:  Negative for rash.  Neurological:  Negative for dizziness.  Psychiatric/Behavioral:  Negative for depression.     Physical Exam BP (!) 143/127 (BP Location: Right Arm)   Pulse 94   Temp 97.9 F (36.6 C) (Oral)  Resp 20   Ht '5\' 11"'  (1.803 m)   Wt 121.2 kg   SpO2 93%   BMI 37.27 kg/m  CONSTITUTIONAL: No acute distress HEENT:  Normocephalic, atraumatic, extraocular motion intact.  Poor dentition. NECK: Trachea is midline, and there is no jugular venous distension. RESPIRATORY:  Normal respiratory effort without pathologic use of accessory muscles. CARDIOVASCULAR: Regular rhythm and rate. GI: The abdomen is soft, obese, non-distended, with tenderness in RUQ.  Negative Murphy's sign currently.  MUSCULOSKELETAL:  Normal muscle strength and tone in all four extremities.  No peripheral edema or cyanosis. SKIN: Skin turgor is normal. There are no pathologic skin lesions.  NEUROLOGIC:  Motor and sensation is grossly normal.  Cranial nerves are grossly intact. PSYCH:  Alert and oriented to person, place and time. Affect is normal.  Laboratory Analysis: Results for orders placed or performed during the hospital encounter of 09/10/21 (from the past 24 hour(s))  Lipase,  blood     Status: None   Collection Time: 09/10/21 11:15 AM  Result Value Ref Range   Lipase 25 11 - 51 U/L  Comprehensive metabolic panel     Status: Abnormal   Collection Time: 09/10/21 11:15 AM  Result Value Ref Range   Sodium 137 135 - 145 mmol/L   Potassium 4.2 3.5 - 5.1 mmol/L   Chloride 104 98 - 111 mmol/L   CO2 23 22 - 32 mmol/L   Glucose, Bld 108 (H) 70 - 99 mg/dL   BUN 41 (H) 6 - 20 mg/dL   Creatinine, Ser 3.40 (H) 0.61 - 1.24 mg/dL   Calcium 8.6 (L) 8.9 - 10.3 mg/dL   Total Protein 6.9 6.5 - 8.1 g/dL   Albumin 3.3 (L) 3.5 - 5.0 g/dL   AST 26 15 - 41 U/L   ALT 27 0 - 44 U/L   Alkaline Phosphatase 116 38 - 126 U/L   Total Bilirubin 3.3 (H) 0.3 - 1.2 mg/dL   GFR, Estimated 20 (L) >60 mL/min   Anion gap 10 5 - 15  CBC     Status: Abnormal   Collection Time: 09/10/21 11:15 AM  Result Value Ref Range   WBC 7.8 4.0 - 10.5 K/uL   RBC 5.43 4.22 - 5.81 MIL/uL   Hemoglobin 13.8 13.0 - 17.0 g/dL   HCT 44.2 39.0 - 52.0 %   MCV 81.4 80.0 - 100.0 fL   MCH 25.4 (L) 26.0 - 34.0 pg   MCHC 31.2 30.0 - 36.0 g/dL   RDW 17.2 (H) 11.5 - 15.5 %   Platelets 272 150 - 400 K/uL   nRBC 0.0 0.0 - 0.2 %  Urinalysis, Routine w reflex microscopic     Status: Abnormal   Collection Time: 09/10/21  1:02 PM  Result Value Ref Range   Color, Urine YELLOW (A) YELLOW   APPearance HAZY (A) CLEAR   Specific Gravity, Urine 1.010 1.005 - 1.030   pH 5.0 5.0 - 8.0   Glucose, UA NEGATIVE NEGATIVE mg/dL   Hgb urine dipstick MODERATE (A) NEGATIVE   Bilirubin Urine NEGATIVE NEGATIVE   Ketones, ur NEGATIVE NEGATIVE mg/dL   Protein, ur >=300 (A) NEGATIVE mg/dL   Nitrite NEGATIVE NEGATIVE   Leukocytes,Ua SMALL (A) NEGATIVE   RBC / HPF 0-5 0 - 5 RBC/hpf   WBC, UA >50 (H) 0 - 5 WBC/hpf   Bacteria, UA NONE SEEN NONE SEEN   Squamous Epithelial / LPF 0-5 0 - 5   Mucus PRESENT    Hyaline Casts, UA  PRESENT     Imaging: MR ABDOMEN MRCP WO CONTRAST  Result Date: 09/10/2021 CLINICAL DATA:  Abdominal  distension and right upper quadrant abdominal pain. Cholelithiasis seen on ultrasound examination. EXAM: MRI ABDOMEN WITHOUT CONTRAST  (INCLUDING MRCP) TECHNIQUE: Multiplanar multisequence MR imaging of the abdomen was performed. Heavily T2-weighted images of the biliary and pancreatic ducts were obtained, and three-dimensional MRCP images were rendered by post processing. COMPARISON:  CT and ultrasound examinations, same date. FINDINGS: Lower chest: The lung bases are grossly clear. No pleural or pericardial effusion. Hepatobiliary: No hepatic lesions are identified without contrast. No intrahepatic biliary dilatation. Small layering gallstones are noted the gallbladder. No gallbladder wall thickening is identified. There is a small amount of pericholecystic fluid noted. Normal caliber and course of the common bile duct. No common bile duct stones. Pancreas: No mass, inflammation or ductal dilatation. Prominent fatty interstices noted. Spleen:  Normal size.  No focal lesions. Adrenals/Urinary Tract:  The adrenal glands are normal. Bilateral renal cysts.  No hydronephrosis. Stomach/Bowel: The stomach, duodenum, small bowel and colon are unremarkable. Vascular/Lymphatic: The aorta is normal in caliber. No mesenteric or retroperitoneal mass or adenopathy. Other:  No ascites or abdominal wall hernia. Musculoskeletal: No significant bony findings. IMPRESSION: 1. Cholelithiasis. No gallbladder wall thickening but there is small amount of pericholecystic fluid. Nuclear medicine hepatobiliary scan may be helpful to assess cystic duct patency. 2. Normal caliber and course of the common bile duct. No common bile duct stones. 3. No other acute abdominal findings, mass lesions or adenopathy. 4. Numerous bilateral renal cysts. Electronically Signed   By: Marijo Sanes M.D.   On: 09/10/2021 21:26   US ABDOMEN LIMITED RUQ (LIVER/GB)  Result Date: 09/10/2021 CLINICAL DATA:  Right upper quadrant pain over the last 2-3 weeks.  EXAM: ULTRASOUND ABDOMEN LIMITED RIGHT UPPER QUADRANT COMPARISON:  CT same day FINDINGS: Gallbladder: Gallstones, the largest measuring 12 mm. Wall thickening measuring 4 mm. Tiny amount of pericholecystic fluid. No sonographic Murphy sign. None the less, the findings are worrisome for cholecystitis. Common bile duct: Diameter: 8 mm, dilated.  No visible stone. Liver: Increased echogenicity suggesting fatty change. No focal lesion. Portal vein is patent on color Doppler imaging with normal direction of blood flow towards the liver. Other: None. IMPRESSION: Chololithiasis. Largest stone 12 mm. Gallbladder wall thickening at 4 mm. Small amount of pericholecystic fluid. Findings worrisome for acute cholecystitis. Consider nuclear medicine study if concern persists. Prominent common bile duct at 8 mm diameter. No ductal stone is identified. Electronically Signed   By: Nelson Chimes M.D.   On: 09/10/2021 14:28   CT ABDOMEN PELVIS WO CONTRAST  Result Date: 09/10/2021 CLINICAL DATA:  Acute, nonlocalized abdominal pain with bowel obstruction suspected EXAM: CT ABDOMEN AND PELVIS WITHOUT CONTRAST TECHNIQUE: Multidetector CT imaging of the abdomen and pelvis was performed following the standard protocol without IV contrast. RADIATION DOSE REDUCTION: This exam was performed according to the departmental dose-optimization program which includes automated exposure control, adjustment of the mA and/or kV according to patient size and/or use of iterative reconstruction technique. COMPARISON:  05/06/2021 FINDINGS: Lower chest: Calcified nodules in the right lower lobe as previously noted. Interlobular septal thickening at the lateral costophrenic sulci, possibly scarring given stability from prior. Hepatobiliary: No focal liver abnormality.Prominent size of the caudate lobe with surface lobulation. The umbilical vein is also conspicuous. Stranding at the hepatic hilum adjacent to the gallbladder which is only moderately  distended but does contain calculi. Pancreas: Generalized fatty atrophy Spleen: Unremarkable. Adrenals/Urinary Tract:  Negative adrenals. No hydronephrosis or stone. Bilateral renal cysts measuring up to 5.1 cm at the right lower pole and on the left up to 4 cm at the upper pole. Subcentimeter presumed hemorrhagic cyst in the interpolar left kidney, interval circumferential thick walled bladder, chronic. Stomach/Bowel: No obstruction. Right upper quadrant inflammation but not associated with thickening of adjacent stomach or duodenum. Vascular/Lymphatic: No acute vascular abnormality. Atherosclerosis. No mass or adenopathy. Reproductive:No pathologic findings. Other: Trace pelvic fluid. No pneumoperitoneum. Fatty right groin and umbilical hernias. Musculoskeletal: No acute abnormalities. IMPRESSION: 1. Right upper quadrant fat stranding in close proximity to the gallbladder which contains calculi, question symptoms of cholecystitis. 2. Possible cirrhosis, please correlate for risk factors. 3. Fatty umbilical and right groin hernias 4. Atherosclerosis. Electronically Signed   By: Jorje Guild M.D.   On: 09/10/2021 12:58    Assessment and Plan: This is a 56 y.o. male with RUQ pain.  --Discussed with the patient the findings on his CT scan and U/S.  His MRCP was still pending at the time of my evaluation.  This has since been completed.  Overall unfortunately, he has some equivocal findings.  His clinical exam is not as impressive, with negative Murphy's sign, normal WBC, but has elevated total bilirubin of 3.3, with one study showing wall thickening, another one not showing that, some trace fluid.   --He was tried on a po challenge and the patient did not tolerate.  As such, recommended that he be admitted to medical team due to his comorbidities for rule out cholecystitis.  HIDA scan can be ordered for tomorrow for further evaluate.  If he does have cholecystitis, he would not be a good surgical candidate  given his medical comorbidities, particularly such a low EF.  If the HIDA is positive, and he has no improvement with antibiotics in his exam, would then need a percutaneous cholecystostomy drain. --Will continue to assess and check his progress in the AM.  For now, keep NPO, start IV abx, and appropriate pain/nausea control.  I spent 60 minutes dedicated to the care of this patient on the date of this encounter to include pre-visit review of records, face-to-face time with the patient discussing diagnosis and management, and any post-visit coordination of care.   Melvyn Neth, MD [facility] Pg:  [phone]

## 2021-09-10 NOTE — Assessment & Plan Note (Signed)
TEE January 2023 with EF less than 20% Monitor for fluid overload with IV hydration for recurrent vomiting IV hydralazine, metoprolol and Lasix while n.p.o., to resume home carvedilol, hydralazine and torsemide and back to regular oral diet

## 2021-09-10 NOTE — Assessment & Plan Note (Signed)
CPAP.  

## 2021-09-10 NOTE — Assessment & Plan Note (Addendum)
Nausea and vomiting CT abdomen and pelvis, right upper quadrant ultrasound and MRCP nondiagnostic for acute cholecystitis. MRCP with recommendation for HIDA scan to assess cystic duct patency. HIDA scan ordered for the a.m. per recommendation of surgeon, Dr. Hampton Abbot who was consulted by the ED N.p.o. from midnight Pain control, IV antiemetics, gentle IV hydration

## 2021-09-11 ENCOUNTER — Inpatient Hospital Stay: Payer: Medicaid Other

## 2021-09-11 ENCOUNTER — Encounter: Payer: Self-pay | Admitting: Internal Medicine

## 2021-09-11 DIAGNOSIS — R1011 Right upper quadrant pain: Secondary | ICD-10-CM | POA: Diagnosis not present

## 2021-09-11 DIAGNOSIS — K8020 Calculus of gallbladder without cholecystitis without obstruction: Secondary | ICD-10-CM | POA: Diagnosis not present

## 2021-09-11 LAB — COMPREHENSIVE METABOLIC PANEL
ALT: 28 U/L (ref 0–44)
AST: 31 U/L (ref 15–41)
Albumin: 3.1 g/dL — ABNORMAL LOW (ref 3.5–5.0)
Alkaline Phosphatase: 112 U/L (ref 38–126)
Anion gap: 9 (ref 5–15)
BUN: 44 mg/dL — ABNORMAL HIGH (ref 6–20)
CO2: 25 mmol/L (ref 22–32)
Calcium: 8.5 mg/dL — ABNORMAL LOW (ref 8.9–10.3)
Chloride: 104 mmol/L (ref 98–111)
Creatinine, Ser: 3.72 mg/dL — ABNORMAL HIGH (ref 0.61–1.24)
GFR, Estimated: 18 mL/min — ABNORMAL LOW (ref >60)
Glucose, Bld: 108 mg/dL — ABNORMAL HIGH (ref 70–99)
Potassium: 4.1 mmol/L (ref 3.5–5.1)
Sodium: 138 mmol/L (ref 135–145)
Total Bilirubin: 3 mg/dL — ABNORMAL HIGH (ref 0.3–1.2)
Total Protein: 6.3 g/dL — ABNORMAL LOW (ref 6.5–8.1)

## 2021-09-11 LAB — CBC
HCT: 43.2 % (ref 39.0–52.0)
Hemoglobin: 13.5 g/dL (ref 13.0–17.0)
MCH: 25.6 pg — ABNORMAL LOW (ref 26.0–34.0)
MCHC: 31.3 g/dL (ref 30.0–36.0)
MCV: 82 fL (ref 80.0–100.0)
Platelets: 250 10*3/uL (ref 150–400)
RBC: 5.27 MIL/uL (ref 4.22–5.81)
RDW: 17.2 % — ABNORMAL HIGH (ref 11.5–15.5)
WBC: 7.4 10*3/uL (ref 4.0–10.5)
nRBC: 0 % (ref 0.0–0.2)

## 2021-09-11 LAB — LIPASE, BLOOD: Lipase: 28 U/L (ref 11–51)

## 2021-09-11 MED ORDER — TECHNETIUM TC 99M MEBROFENIN IV KIT
7.4300 | PACK | Freq: Once | INTRAVENOUS | Status: AC | PRN
  Administered 2021-09-11: 7.43 via INTRAVENOUS

## 2021-09-11 MED ORDER — MORPHINE SULFATE (PF) 2 MG/ML IV SOLN
2.0000 mg | INTRAVENOUS | Status: DC | PRN
  Administered 2021-09-11 – 2021-09-13 (×3): 2 mg via INTRAVENOUS
  Filled 2021-09-11 (×3): qty 1

## 2021-09-11 MED ORDER — PIPERACILLIN-TAZOBACTAM 3.375 G IVPB
3.3750 g | Freq: Three times a day (TID) | INTRAVENOUS | Status: DC
  Administered 2021-09-11 – 2021-09-12 (×3): 3.375 g via INTRAVENOUS
  Filled 2021-09-11 (×3): qty 50

## 2021-09-11 MED ORDER — HYDROCODONE-ACETAMINOPHEN 5-325 MG PO TABS
1.0000 | ORAL_TABLET | Freq: Four times a day (QID) | ORAL | Status: DC | PRN
  Administered 2021-09-11: 1 via ORAL
  Filled 2021-09-11 (×2): qty 1

## 2021-09-11 NOTE — Progress Notes (Signed)
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 flutter on Eliquis, CKD lllb,  hx of GI bleed 2015, OSA, Presents to the emergency room with Right upper quadrantAbdominal pain for the past 2  right upper quadrant distention for the past few days associated with  nausea.  He denies fever, chills and diarrhea he does have a bit of dysuria.  He denies chest pain or shortness of breath.ED course and data review: BP elevated to 578/469 with diastolics going as high as 127.  Vitals otherwise unremarkable.Labs: WBC 7.8 hemoglobin 13.8.  CMP with creatinine at baseline at 3.4.  LFTs within normal limits except for elevated total bilirubin of 3.3.  Lipase normal at 25.  Urinalysis with negative nitrite small leukocyte esterase and no bacteria.  WBC over 50. Imaging CT abdomen and pelvis was concerning for acute cholecystitis Right upper quadrant ultrasound showed cholelithiasis and a prominent CBD of 8 mm with no ductal stone MRCP showed the following:"Cholelithiasis. No gallbladder wall thickening but there is small amount of pericholecystic fluid. Nuclear medicine hepatobiliary scan may be helpful to assess cystic duct patency. Dr. Hampton [facility] was consulted recommended a p.o. challenge, HIDA scan ordered.Patient was given a dose of Rocephin for possible UTI    Subjective: Seen and examined this am On [place] Oxygen at 2l  still has RUQ abd pain- going on for 2 wk- pain is okay he says. No nausea or vomiting or chest pain or shortness of breath.  Assessment and Plan: Principal Problem:   Right upper quadrant pain Active Problems:   Nausea and vomiting   Nonischemic cardiomyopathy: EF 20-25%   Paroxysmal atrial fibrillation (HCC)   Essential  hypertension   Obstructive sleep apnea   CKD (chronic kidney disease) stage 4, GFR 15-29 ml/min (HCC)   Hypothyroidism  Right upper quadrant pain Nausea and vomiting: Possible calculous acute cholecystitis surgery folllowing- awaiting HIDA . Had CT abd, RUQ [place] and MRCP. Cont Pain control, IV antiemetics As per surgery patient is a poor candidate for surgical intervention-if HIDA scan positive may need IR percutaneous drainage.Adding IV Zosyn per pharmacy  Nonischemic cardiomyopathy: EF 20-25%-TEE January 2023 with EF less than 20% No chest pain, is euvolemic.  Gentle IVF while NPO, watch for fluid overload.  On Coreg, hydralazine and torsemide  Hypothyroidism: Continue levothyroxine  CKD stage 4: Creatinine remains stable.  Monitor Recent Labs  Lab 09/10/21 1115  BUN 41*  CREATININE 3.40*    GEX:BMWU PAP Essential hypertension:y BP well controlled, continue Coreg, hydralazine.  Paroxysmal atrial fibrillation:  On apixaban and Coreg. Held Eliquis pending further surgery evaluation, he had TEE cardioversion in 2023 Jan.  No history of prior stroke  Class II Obesity:Patient's Body mass index is 37.27 kg/m. : Will benefit with PCP follow-up, weight loss  healthy lifestyle   DVT prophylaxis: SCD. Code Status:   Code Status: Full Code Family Communication: plan of care discussed with patient at bedside. Patient status is: Inpatient  because of further surgical evaluation pending HIDA scan for "possible cholecystitis Level of care: Med-Surg   Dispo: The patient is from: home            Anticipated disposition: home in 1-2 days  Mobility Assessment (last 72 hours)     Mobility Assessment  Santel Name 09/11/21 0900           Does patient have an order for bedrest or is patient medically unstable No - Continue assessment       What is the highest level of mobility based on the progressive mobility assessment? Level 5 (Walks with assist in room/hall) - Balance while stepping  forward/back and can walk in room with assist - Complete                 Objective: Vitals last 24 hrs: Vitals:   09/11/21 0500 09/11/21 0526 09/11/21 0852 09/11/21 1000  BP: (!) 123/99  (!) 122/109 124/89  Pulse: 88  84 81  Resp: 18  14 16   Temp:  98.1 F (36.7 C) 98.3 F (36.8 C) 98.6 F (37 C)  TempSrc:  Oral Oral Oral  SpO2: 94%  95% 96%  Weight:      Height:       Weight change:   Physical Examination: General exam: alert awake,older than stated age, weak appearing. HEENT:Oral mucosa moist, Ear/Nose WNL grossly, dentition normal. Respiratory system: bilaterally diminished BS, no use of accessory muscle Cardiovascular system: S1 & S2 +, No JVD. Gastrointestinal system: Abdomen mildly tender  RUQ,ND, BS+ Nervous System:Alert, awake, moving extremities and grossly nonfocal Extremities: LE edema neg,distal peripheral pulses palpable.  Skin: No rashes,no icterus. MSK: Normal muscle bulk,tone, power  Medications reviewed:  Scheduled Meds:  atorvastatin  80 mg Oral Daily   carvedilol  12.5 mg Oral BID WC   dapagliflozin propanediol  10 mg Oral QAC breakfast   ferrous gluconate  324 mg Oral Q breakfast   hydrALAZINE  25 mg Oral TID   levothyroxine  100 mcg Oral QAC breakfast   potassium chloride SA  20 mEq Oral Daily   torsemide  20 mg Oral Daily   Continuous Infusions:  piperacillin-tazobactam (ZOSYN)  IV 3.375 g (09/11/21 1016)      Diet Order             Diet NPO time specified  Diet effective midnight                            Intake/Output Summary (Last 24 hours) at 09/11/2021 1049 Last data filed at 09/11/2021 0052 Gross per 24 hour  Intake 100 ml  Output --  Net 100 ml   Net IO Since Admission: 100 mL [09/11/21 1049]  Wt Readings from Last 3 Encounters:  09/10/21 121.2 kg  08/19/21 121.2 kg  07/29/21 123.4 kg     Unresulted Labs (From admission, onward)     Start     Ordered   09/11/21 0957  Comprehensive metabolic panel  Once,    R        09/11/21 0957   09/11/21 0756  Urine Culture  (Urine Culture)  Add-on,   AD       Question:  Indication  Answer:  Dysuria   09/11/21 0755          Data Reviewed: I have personally reviewed following labs and imaging studies CBC: Recent Labs  Lab 09/10/21 1115 09/11/21 1024  WBC 7.8 7.4  HGB 13.8 13.5  HCT 44.2 43.2  MCV 81.4 82.0  PLT 272 798   Basic Metabolic Panel: Recent Labs  Lab 09/10/21 1115  NA 137  K 4.2  CL 104  CO2 23  GLUCOSE 108*  BUN 41*  CREATININE 3.40*  CALCIUM 8.6*  GFR: Estimated Creatinine Clearance: 31.8 mL/min (A) (by C-G formula based on SCr of 3.4 mg/dL (H)). Liver Function Tests: Recent Labs  Lab 09/10/21 1115  AST 26  ALT 27  ALKPHOS 116  BILITOT 3.3*  PROT 6.9  ALBUMIN 3.3*   Recent Labs  Lab 09/10/21 1115  LIPASE 25   No results for input(s): "AMMONIA" in the last 168 hours. Coagulation Profile: No results for input(s): "INR", "PROTIME" in the last 168 hours. BNP (last 3 results) No results for input(s): "PROBNP" in the last 8760 hours. HbA1C: No results for input(s): "HGBA1C" in the last 72 hours. CBG: No results for input(s): "GLUCAP" in the last 168 hours. Lipid Profile: No results for input(s): "CHOL", "HDL", "LDLCALC", "TRIG", "CHOLHDL", "LDLDIRECT" in the last 72 hours. Thyroid Function Tests: No results for input(s): "TSH", "T4TOTAL", "FREET4", "T3FREE", "THYROIDAB" in the last 72 hours. Sepsis Labs: No results for input(s): "PROCALCITON", "LATICACIDVEN" in the last 168 hours.  No results found for this or any previous visit (from the past 240 hour(s)).  Antimicrobials: Anti-infectives (From admission, onward)    Start     Dose/Rate Route Frequency Ordered Stop   09/11/21 1100  piperacillin-tazobactam (ZOSYN) IVPB 3.375 g        3.375 g 12.5 mL/hr over 240 Minutes Intravenous Every 8 hours 09/11/21 1011     09/10/21 2300  cefTRIAXone (ROCEPHIN) 1 g in sodium chloride 0.9 % 100 mL IVPB        1  g 200 mL/hr over 30 Minutes Intravenous  Once 09/10/21 2254 09/11/21 0052      Culture/Microbiology    Component Value Date/Time   SDES  05/06/2021 1455    URINE, RANDOM Performed at [facility], [address] [place], [place] [address]    [facility]  05/06/2021 1455    NONE Performed at [facility], [address] [place], [place] [address]    CULT MULTIPLE SPECIES PRESENT, SUGGEST RECOLLECTION (A) 05/06/2021 1455   REPTSTATUS 05/08/2021 FINAL 05/06/2021 1455    Other culture-see note  Radiology Studies: MR ABDOMEN MRCP WO CONTRAST  Result Date: 09/10/2021 CLINICAL DATA:  Abdominal distension and right upper quadrant abdominal pain. Cholelithiasis seen on ultrasound examination. EXAM: MRI ABDOMEN WITHOUT CONTRAST  (INCLUDING MRCP) TECHNIQUE: Multiplanar multisequence MR imaging of the abdomen was performed. Heavily T2-weighted images of the biliary and pancreatic ducts were obtained, and three-dimensional MRCP images were rendered by post processing. COMPARISON:  CT and ultrasound examinations, same date. FINDINGS: Lower chest: The lung bases are grossly clear. No pleural or pericardial effusion. Hepatobiliary: No hepatic lesions are identified without contrast. No intrahepatic biliary dilatation. Small layering gallstones are noted the gallbladder. No gallbladder wall thickening is identified. There is a small amount of pericholecystic fluid noted. Normal caliber and course of the common bile duct. No common bile duct stones. Pancreas: No mass, inflammation or ductal dilatation. Prominent fatty interstices noted. Spleen:  Normal size.  No focal lesions. Adrenals/Urinary Tract:  The adrenal glands are normal. Bilateral renal cysts.  No hydronephrosis. Stomach/Bowel: The stomach, duodenum, small bowel and colon are unremarkable. Vascular/Lymphatic: The aorta is normal in caliber. No mesenteric or retroperitoneal mass or adenopathy. Other:  No ascites or abdominal wall  hernia. Musculoskeletal: No significant bony findings. IMPRESSION: 1. Cholelithiasis. No gallbladder wall thickening but there is small amount of pericholecystic fluid. Nuclear medicine hepatobiliary scan may be helpful to assess cystic duct patency. 2. Normal caliber and course of the common bile duct. No common bile duct stones.  3. No other acute abdominal findings, mass lesions or adenopathy. 4. Numerous bilateral renal cysts. Electronically Signed   By: Marijo Sanes M.D.   On: 09/10/2021 21:26   US ABDOMEN LIMITED RUQ (LIVER/GB)  Result Date: 09/10/2021 CLINICAL DATA:  Right upper quadrant pain over the last 2-3 weeks. EXAM: ULTRASOUND ABDOMEN LIMITED RIGHT UPPER QUADRANT COMPARISON:  CT same day FINDINGS: Gallbladder: Gallstones, the largest measuring 12 mm. Wall thickening measuring 4 mm. Tiny amount of pericholecystic fluid. No sonographic Murphy sign. None the less, the findings are worrisome for cholecystitis. Common bile duct: Diameter: 8 mm, dilated.  No visible stone. Liver: Increased echogenicity suggesting fatty change. No focal lesion. Portal vein is patent on color Doppler imaging with normal direction of blood flow towards the liver. Other: None. IMPRESSION: Chololithiasis. Largest stone 12 mm. Gallbladder wall thickening at 4 mm. Small amount of pericholecystic fluid. Findings worrisome for acute cholecystitis. Consider nuclear medicine study if concern persists. Prominent common bile duct at 8 mm diameter. No ductal stone is identified. Electronically Signed   By: Nelson Chimes M.D.   On: 09/10/2021 14:28   CT ABDOMEN PELVIS WO CONTRAST  Result Date: 09/10/2021 CLINICAL DATA:  Acute, nonlocalized abdominal pain with bowel obstruction suspected EXAM: CT ABDOMEN AND PELVIS WITHOUT CONTRAST TECHNIQUE: Multidetector CT imaging of the abdomen and pelvis was performed following the standard protocol without IV contrast. RADIATION DOSE REDUCTION: This exam was performed according to the  departmental dose-optimization program which includes automated exposure control, adjustment of the mA and/or kV according to patient size and/or use of iterative reconstruction technique. COMPARISON:  05/06/2021 FINDINGS: Lower chest: Calcified nodules in the right lower lobe as previously noted. Interlobular septal thickening at the lateral costophrenic sulci, possibly scarring given stability from prior. Hepatobiliary: No focal liver abnormality.Prominent size of the caudate lobe with surface lobulation. The umbilical vein is also conspicuous. Stranding at the hepatic hilum adjacent to the gallbladder which is only moderately distended but does contain calculi. Pancreas: Generalized fatty atrophy Spleen: Unremarkable. Adrenals/Urinary Tract: Negative adrenals. No hydronephrosis or stone. Bilateral renal cysts measuring up to 5.1 cm at the right lower pole and on the left up to 4 cm at the upper pole. Subcentimeter presumed hemorrhagic cyst in the interpolar left kidney, interval circumferential thick walled bladder, chronic. Stomach/Bowel: No obstruction. Right upper quadrant inflammation but not associated with thickening of adjacent stomach or duodenum. Vascular/Lymphatic: No acute vascular abnormality. Atherosclerosis. No mass or adenopathy. Reproductive:No pathologic findings. Other: Trace pelvic fluid. No pneumoperitoneum. Fatty right groin and umbilical hernias. Musculoskeletal: No acute abnormalities. IMPRESSION: 1. Right upper quadrant fat stranding in close proximity to the gallbladder which contains calculi, question symptoms of cholecystitis. 2. Possible cirrhosis, please correlate for risk factors. 3. Fatty umbilical and right groin hernias 4. Atherosclerosis. Electronically Signed   By: Jorje Guild M.D.   On: 09/10/2021 12:58     LOS: 1 day   Antonieta Pert, MD [facility]  09/11/2021, 10:49 AM

## 2021-09-11 NOTE — ED Notes (Signed)
Informed RN bed assigned 

## 2021-09-11 NOTE — Hospital Course (Addendum)
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 flutter on Eliquis, CKD lllb,  hx of GI bleed 2015, OSA, Presents to the emergency room with Right upper quadrantAbdominal pain for the past 2  right upper quadrant distention for the past few days associated with  nausea.  He denies fever, chills and diarrhea he does have a bit of dysuria.  He denies chest pain or shortness of breath.ED course and data review: BP elevated to 638/937 with diastolics going as high as 127.  Vitals otherwise unremarkable.Labs: WBC 7.8 hemoglobin 13.8.  CMP with creatinine at baseline at 3.4.  LFTs within normal limits except for elevated total bilirubin of 3.3.  Lipase normal at 25.  Urinalysis with negative nitrite small leukocyte esterase and no bacteria.  WBC over 50. Imaging CT abdomen and pelvis was concerning for acute cholecystitis Right upper quadrant ultrasound showed cholelithiasis and a prominent CBD of 8 mm with no ductal stone MRCP showed the following:"Cholelithiasis. No gallbladder wall thickening but there is small amount of pericholecystic fluid. Nuclear medicine hepatobiliary scan may be helpful to assess cystic duct patency. Dr. Hampton [facility] Surgery was consulted recommended a p.o. challenge, HIDA scan ordered. HIDA scan came back with patent cystic duct with low ejection fraction 6% normal 33%.  Likely has gallbladder dysfunction, placed on diet antibiotic discontinued, urine culture came back negative.

## 2021-09-11 NOTE — Progress Notes (Signed)
[facility] SURGICAL PROGRESS NOTE (cpt [phone])  Hospital Day(s): 1.   Interval History: Patient seen and examined, no acute events or new complaints overnight. Patient reports he is feeling okay; still with some RUQ discomfort. No fever, chills, nausea, emesis. No new labs this morning; orders placed. He is NPO. Plan for HIDA this afternoon.   Review of Systems:  Constitutional: denies fever, chills  HEENT: denies cough or congestion  Respiratory: denies any shortness of breath  Cardiovascular: denies chest pain or palpitations  Gastrointestinal: + abdominal pain, denied N/V Genitourinary: denies burning with urination or urinary frequency Musculoskeletal: denies pain, decreased motor or sensation  Vital signs in last 24 hours: [min-max] current  Temp:  [97.9 F (36.6 C)-98.6 F (37 C)] 98.1 F (36.7 C) (07/20 0526) Pulse Rate:  [75-94] 88 (07/20 0500) Resp:  [17-22] 18 (07/20 0500) BP: (123-145)/(95-127) 123/99 (07/20 0500) SpO2:  [90 %-100 %] 94 % (07/20 0500) Weight:  [121.2 kg] 121.2 kg (07/19 1044)     Height: 5\' 11"  (180.3 cm) Weight: 121.2 kg BMI (Calculated): 37.28   Intake/Output last 2 shifts:  07/19 0701 - 07/20 0700 In: 100 [IV Piggyback:100] Out: -    Physical Exam:  Constitutional: alert, cooperative and no distress  HENT: normocephalic without obvious abnormality  Eyes: PERRL, EOM's grossly intact and symmetric  Respiratory: breathing non-labored at rest  Cardiovascular: regular rate and sinus rhythm  Gastrointestinal: Obese, soft, he is sore in the RUQ, negative Murphy's Sign, non-distended, no rebound/guarding Musculoskeletal: no edema or wounds, motor and sensation grossly intact, NT    Labs:     Latest Ref Rng & Units 09/10/2021   11:15 AM 07/22/2021    9:10 AM 05/06/2021   11:21 AM  CBC  WBC 4.0 - 10.5 K/uL 7.8  5.6  7.5   Hemoglobin 13.0 - 17.0 g/dL 13.8  14.2  13.8   Hematocrit 39.0 - 52.0 % 44.2  46.3  45.4   Platelets 150 - 400  K/uL 272  256  211       Latest Ref Rng & Units 09/10/2021   11:15 AM 08/19/2021    1:48 PM 07/29/2021    9:31 AM  CMP  Glucose 70 - 99 mg/dL 108  89  108   BUN 6 - 20 mg/dL 41  39  46   Creatinine 0.61 - 1.24 mg/dL 3.40  3.17  3.08   Sodium 135 - 145 mmol/L 137  139  138   Potassium 3.5 - 5.1 mmol/L 4.2  3.7  4.5   Chloride 98 - 111 mmol/L 104  104  108   CO2 22 - 32 mmol/L 23  22  21    Calcium 8.9 - 10.3 mg/dL 8.6  8.5  8.9   Total Protein 6.5 - 8.1 g/dL 6.9     Total Bilirubin 0.3 - 1.2 mg/dL 3.3     Alkaline Phos 38 - 126 U/L 116     AST 15 - 41 U/L 26     ALT 0 - 44 U/L 27        Imaging studies: No new pertinent imaging studies; HIDA pending    Assessment/Plan: (ICD-10's: R10.11) 57 y.o. male with persistent RUQ discomfort found to have cholelithiasis with equivocal gallbladder changes concerning for potential cholecystitis, complicated by CHF with non-ischemic cardiomyopathy and an EF of < 20% on 03/05/21, Afib on Eliquis, HTN.   - Will await results of HIDA to definitively rule in/out cholecystitis. If positive, we can  attempt management with Abx vs percutaneous cholecystostomy tube placement. Unfortunately, given his cardiac history of decreased EF, he is a sub-optimal surgical candidate.   - Will start Abx (Zosyn) today as a precaution - Repeat labs are pending - Continue NPO for now   - Monitor abdominal examination; on-going bowel function   - Pain control prn; antiemetics prn - Further management per primary service; we will follow    All of the above findings and recommendations were discussed with the patient, and the medical team, and all of patient's questions were answered to his expressed satisfaction.  -- Edison Simon, PA-C [facility] 09/11/2021, 8:00 AM M-F: 7am - 4pm

## 2021-09-11 NOTE — Progress Notes (Signed)
Admission profile updated. ?

## 2021-09-11 NOTE — Consult Note (Signed)
Pharmacy Antibiotic Note  Hector [name] is a 56 y.o. male admitted on 09/10/2021 with  Suspected cholecystitis .  Pharmacy has been consulted for pip/tazo dosing.  Plan: Zosyn 3.375g IV q8h (4 hour infusion).  Height: 5\' 11"  (180.3 cm) Weight: 121.2 kg (267 lb 3.2 oz) IBW/kg (Calculated) : 75.3  Temp (24hrs), Avg:98.2 F (36.8 C), Min:97.9 F (36.6 C), Max:98.6 F (37 C)  Recent Labs  Lab 09/10/21 1115  WBC 7.8  CREATININE 3.40*    Estimated Creatinine Clearance: 31.8 mL/min (A) (by C-G formula based on SCr of 3.4 mg/dL (H)).    Allergies  Allergen Reactions   Esomeprazole Magnesium Other (See Comments)    Suspected interstitial nephritis 2018   Eggs Or Egg-Derived Products Rash    Antimicrobials this admission: 7/20 pip/tazo >>   Dose adjustments this admission: None  Microbiology results: 7/19 BCx: pending  Thank you for allowing pharmacy to be a part of this patient's care.  Oswald Hillock, PharmD, BCPS 09/11/2021 10:09 AM

## 2021-09-12 LAB — COMPREHENSIVE METABOLIC PANEL
ALT: 31 U/L (ref 0–44)
AST: 29 U/L (ref 15–41)
Albumin: 2.9 g/dL — ABNORMAL LOW (ref 3.5–5.0)
Alkaline Phosphatase: 105 U/L (ref 38–126)
Anion gap: 8 (ref 5–15)
BUN: 47 mg/dL — ABNORMAL HIGH (ref 6–20)
CO2: 23 mmol/L (ref 22–32)
Calcium: 8.2 mg/dL — ABNORMAL LOW (ref 8.9–10.3)
Chloride: 105 mmol/L (ref 98–111)
Creatinine, Ser: 3.94 mg/dL — ABNORMAL HIGH (ref 0.61–1.24)
GFR, Estimated: 17 mL/min — ABNORMAL LOW (ref >60)
Glucose, Bld: 124 mg/dL — ABNORMAL HIGH (ref 70–99)
Potassium: 4.1 mmol/L (ref 3.5–5.1)
Sodium: 136 mmol/L (ref 135–145)
Total Bilirubin: 2.6 mg/dL — ABNORMAL HIGH (ref 0.3–1.2)
Total Protein: 6 g/dL — ABNORMAL LOW (ref 6.5–8.1)

## 2021-09-12 LAB — CBC
HCT: 41.7 % (ref 39.0–52.0)
Hemoglobin: 13.3 g/dL (ref 13.0–17.0)
MCH: 25.8 pg — ABNORMAL LOW (ref 26.0–34.0)
MCHC: 31.9 g/dL (ref 30.0–36.0)
MCV: 81 fL (ref 80.0–100.0)
Platelets: 229 10*3/uL (ref 150–400)
RBC: 5.15 MIL/uL (ref 4.22–5.81)
RDW: 16.9 % — ABNORMAL HIGH (ref 11.5–15.5)
WBC: 6.9 10*3/uL (ref 4.0–10.5)
nRBC: 0 % (ref 0.0–0.2)

## 2021-09-12 LAB — URINE CULTURE: Culture: NO GROWTH

## 2021-09-12 MED ORDER — CARVEDILOL 6.25 MG PO TABS
6.2500 mg | ORAL_TABLET | Freq: Two times a day (BID) | ORAL | Status: DC
  Administered 2021-09-13 – 2021-09-14 (×3): 6.25 mg via ORAL
  Filled 2021-09-12 (×4): qty 1

## 2021-09-12 MED ORDER — APIXABAN 5 MG PO TABS
5.0000 mg | ORAL_TABLET | Freq: Two times a day (BID) | ORAL | Status: DC
  Administered 2021-09-12 – 2021-09-14 (×5): 5 mg via ORAL
  Filled 2021-09-12 (×5): qty 1

## 2021-09-12 NOTE — Progress Notes (Signed)
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 flutter on Eliquis, CKD lllb,  hx of GI bleed 2015, OSA, Presents to the emergency room with Right upper quadrantAbdominal pain for the past 2  right upper quadrant distention for the past few days associated with  nausea.  He denies fever, chills and diarrhea he does have a bit of dysuria.  He denies chest pain or shortness of breath.ED course and data review: BP elevated to 876/811 with diastolics going as high as 127.  Vitals otherwise unremarkable.Labs: WBC 7.8 hemoglobin 13.8.  CMP with creatinine at baseline at 3.4.  LFTs within normal limits except for elevated total bilirubin of 3.3.  Lipase normal at 25.  Urinalysis with negative nitrite small leukocyte esterase and no bacteria.  WBC over 50. Imaging CT abdomen and pelvis was concerning for acute cholecystitis Right upper quadrant ultrasound showed cholelithiasis and a prominent CBD of 8 mm with no ductal stone MRCP showed the following:"Cholelithiasis. No gallbladder wall thickening but there is small amount of pericholecystic fluid. Nuclear medicine hepatobiliary scan may be helpful to assess cystic duct patency. Dr. Hampton Abbot [facility] Surgery was consulted recommended a p.o. challenge, HIDA scan ordered. HIDA scan came back with patent cystic duct with low ejection fraction 6% normal 33%.  Likely has gallbladder dysfunction, placed on diet antibiotic discontinued, urine culture came back negative.    Subjective: Reports he had a lot of pain last night but he ate a lot of food.  This morning he is trying small volume  He would like to see how he does today before he goes home tomorrow.   No chest pain nausea vomiting. Assessment and Plan: Principal  Problem:   Right upper quadrant pain Active Problems:   Nausea and vomiting   Nonischemic cardiomyopathy: EF 20-25%   Paroxysmal atrial fibrillation (HCC)   Essential hypertension   Obstructive sleep apnea   CKD (chronic kidney disease) stage 4, GFR 15-29 ml/min (HCC)   Hypothyroidism  Right upper quadrant pain Nausea and vomiting Gallbladder dysfunction Cholelithiasis: Possible calculous acute cholecystitis surgery folllowing-negative HIDA scan for acute cholecystitis.  Surgery signed off advised low-fat diet, asked patient to have low volume meal as he had significant pain after eating large dinner last night.  Monitor overnight on current diet of antibiotics.   Nonischemic cardiomyopathy: EF 20-25%-TEE January 2023 with EF less than 20% No chest pain, is euvolemic.  Off IV fluids on oral diet, holding torsemide due to elevated creatinine.  Continue rest of the home meds as blood pressure allows. On Coreg, hydralazine  Hypothyroidism: Continue levothyroxine  CKD stage 4: Creatinine slightly up at 3.9, hold torsemide today. Monitor Recent Labs  Lab 09/10/21 1115 09/11/21 1024 09/12/21 0557  BUN 41* 44* 47*  CREATININE 3.40* 3.72* 3.94*    XBW:IOMB PAP Essential hypertension:y BP soft. Cont Coreg, hydralazine as bp allows. Paroxysmal atrial fibrillation: Rate controlled continue Coreg , resume eliquis. Class II Obesity:Patient's Body mass index is 37.27 kg/m. : Will benefit with PCP follow-up, weight loss  healthy lifestyle   DVT prophylaxis: SCD.eliquis Code Status:   Code Status: Full Code Family Communication: plan of care discussed with patient at bedside. Patient status is: Inpatient due to gallbladder dysfunction with upper upper abdominal pain  Level of care: Med-Surg  Dispo:  The patient is from: home            Anticipated disposition: home tomorrow if able to tolerate diet   Mobility Assessment (last 72 hours)     Mobility Assessment     Row Name 09/11/21  2205 09/11/21 0900         Does patient have an order for bedrest or is patient medically unstable No - Continue assessment No - Continue assessment      What is the highest level of mobility based on the progressive mobility assessment? Level 5 (Walks with assist in room/hall) - Balance while stepping forward/back and can walk in room with assist - Complete Level 5 (Walks with assist in room/hall) - Balance while stepping forward/back and can walk in room with assist - Complete                Objective: Vitals last 24 hrs: Vitals:   09/11/21 1924 09/12/21 0332 09/12/21 0829 09/12/21 1014  BP: 90/67 (!) 86/72 102/70 110/88  Pulse: (!) 52 60 62   Resp: 16 20 18    Temp: 97.7 F (36.5 C) (!) 97.5 F (36.4 C) 97.8 F (36.6 C)   TempSrc:   Oral   SpO2: 90% 94% 91%   Weight:      Height:       Weight change:   Physical Examination: General exam: AAx3, older than stated age, weak appearing. HEENT:Oral mucosa moist, Ear/Nose WNL grossly, dentition normal. Respiratory system: bilaterally diminished, no use of accessory muscle Cardiovascular system: S1 & S2 +, No JVD,. Gastrointestinal system: Abdomen soft, ruq mildly tender Nervous System:Alert, awake, moving extremities and grossly nonfocal Extremities: LE ankle edema, distal peripheral pulses palpable.  Skin: No rashes,no icterus. MSK: Normal muscle bulk,tone, power   Medications reviewed:  Scheduled Meds:  apixaban  5 mg Oral BID   atorvastatin  80 mg Oral Daily   carvedilol  12.5 mg Oral BID WC   dapagliflozin propanediol  10 mg Oral QAC breakfast   ferrous gluconate  324 mg Oral Q breakfast   hydrALAZINE  25 mg Oral TID   levothyroxine  100 mcg Oral QAC breakfast   potassium chloride SA  20 mEq Oral Daily   Continuous Infusions:      Diet Order             Diet Heart Room service appropriate? Yes; Fluid consistency: Thin  Diet effective now                  Intake/Output Summary (Last 24 hours) at  09/12/2021 1248 Last data filed at 09/12/2021 1045 Gross per 24 hour  Intake 855.42 ml  Output 100 ml  Net 755.42 ml   Net IO Since Admission: 855.42 mL [09/12/21 1248]  Wt Readings from Last 3 Encounters:  09/10/21 121.2 kg  08/19/21 121.2 kg  07/29/21 123.4 kg     Unresulted Labs (From admission, onward)     Start     Ordered   09/12/21 0500  CBC  Daily at 5am,   R      09/11/21 1051          Data Reviewed: I have personally reviewed following labs and imaging studies CBC: Recent Labs  Lab 09/10/21 1115 09/11/21 1024 09/12/21 0557  WBC 7.8 7.4 6.9  HGB 13.8 13.5 13.3  HCT 44.2 43.2 41.7  MCV 81.4 82.0 81.0  PLT 272 250 732   Basic Metabolic Panel: Recent Labs  Lab 09/10/21 1115  09/11/21 1024 09/12/21 0557  NA 137 138 136  K 4.2 4.1 4.1  CL 104 104 105  CO2 23 25 23   GLUCOSE 108* 108* 124*  BUN 41* 44* 47*  CREATININE 3.40* 3.72* 3.94*  CALCIUM 8.6* 8.5* 8.2*   GFR: Estimated Creatinine Clearance: 27.4 mL/min (A) (by C-G formula based on SCr of 3.94 mg/dL (H)). Liver Function Tests: Recent Labs  Lab 09/10/21 1115 09/11/21 1024 09/12/21 0557  AST 26 31 29   ALT 27 28 31   ALKPHOS 116 112 105  BILITOT 3.3* 3.0* 2.6*  PROT 6.9 6.3* 6.0*  ALBUMIN 3.3* 3.1* 2.9*   Recent Labs  Lab 09/10/21 1115 09/11/21 1849  LIPASE 25 28   No results for input(s): "AMMONIA" in the last 168 hours. Coagulation Profile: No results for input(s): "INR", "PROTIME" in the last 168 hours. BNP (last 3 results) No results for input(s): "PROBNP" in the last 8760 hours. HbA1C: No results for input(s): "HGBA1C" in the last 72 hours. CBG: No results for input(s): "GLUCAP" in the last 168 hours. Lipid Profile: No results for input(s): "CHOL", "HDL", "LDLCALC", "TRIG", "CHOLHDL", "LDLDIRECT" in the last 72 hours. Thyroid Function Tests: No results for input(s): "TSH", "T4TOTAL", "FREET4", "T3FREE", "THYROIDAB" in the last 72 hours. Sepsis Labs: No results for input(s):  "PROCALCITON", "LATICACIDVEN" in the last 168 hours.  Recent Results (from the past 240 hour(s))  Urine Culture     Status: None   Collection Time: 09/10/21  1:02 PM   Specimen: Urine, Clean Catch  Result Value Ref Range Status   Specimen Description   Final    URINE, CLEAN CATCH Performed at [facility], [address] [place], [place] [address]    Special Requests   Final    NONE Performed at [facility], [address], [place], [place] [address]    Culture   Final    NO GROWTH Performed at [facility], [place] [address] [place], [place] [address]    Report Status 09/12/2021 FINAL  Final    Antimicrobials: Anti-infectives (From admission, onward)    Start     Dose/Rate Route Frequency Ordered Stop   09/11/21 1100  piperacillin-tazobactam (ZOSYN) IVPB 3.375 g  Status:  Discontinued        3.375 g 12.5 mL/hr over 240 Minutes Intravenous Every 8 hours 09/11/21 1011 09/12/21 0851   09/10/21 2300  cefTRIAXone (ROCEPHIN) 1 g in sodium chloride 0.9 % 100 mL IVPB        1 g 200 mL/hr over 30 Minutes Intravenous  Once 09/10/21 2254 09/11/21 0052      Culture/Microbiology    Component Value Date/Time   SDES  09/10/2021 1302    URINE, CLEAN CATCH Performed at [facility], [address] [place], [place] [address]    [facility]  09/10/2021 1302    NONE Performed at [facility], [address] [place], [place] [address]    CULT  09/10/2021 1302    NO GROWTH Performed at [facility], [place] [address] [place],  [address]    REPTSTATUS 09/12/2021 FINAL 09/10/2021 1302    Other culture-see note  Radiology Studies: NM Hepato W/EF  Result Date: 09/11/2021 CLINICAL DATA:  Right upper quadrant pain. EXAM: NUCLEAR MEDICINE HEPATOBILIARY IMAGING WITH GALLBLADDER EF TECHNIQUE: Sequential images of the abdomen were obtained out to 60 minutes following intravenous administration of radiopharmaceutical. After oral  ingestion of Ensure, gallbladder ejection fraction was determined. At 60 min, normal ejection  fraction is greater than 33%. RADIOPHARMACEUTICALS:  7.43 mCi Tc-90m  Choletec IV COMPARISON:  MRCP 09/10/2021 and abdominal sonogram 09/10/2021. FINDINGS: Prompt uptake and biliary excretion of activity by the liver is seen. Gallbladder activity is visualized within the first hour, consistent with patency of cystic duct. Biliary activity passes into small bowel, consistent with patent common bile duct. Persistent intense tracer uptake is noted throughout the liver throughout the examination, which may reflect underlying hepatocyte dysfunction. Calculated gallbladder ejection fraction is 6%. (Normal gallbladder ejection fraction with Ensure is greater than 33%.) IMPRESSION: 1. Patent cystic duct without evidence for acute cholecystitis. 2. Abnormally low gallbladder ejection fraction equal to 6% indicative of decreased gallbladder emptying and gallbladder dysfunction. Electronically Signed   By: Kerby Moors M.D.   On: 09/11/2021 15:17   MR ABDOMEN MRCP WO CONTRAST  Result Date: 09/10/2021 CLINICAL DATA:  Abdominal distension and right upper quadrant abdominal pain. Cholelithiasis seen on ultrasound examination. EXAM: MRI ABDOMEN WITHOUT CONTRAST  (INCLUDING MRCP) TECHNIQUE: Multiplanar multisequence MR imaging of the abdomen was performed. Heavily T2-weighted images of the biliary and pancreatic ducts were obtained, and three-dimensional MRCP images were rendered by post processing. COMPARISON:  CT and ultrasound examinations, same date. FINDINGS: Lower chest: The lung bases are grossly clear. No pleural or pericardial effusion. Hepatobiliary: No hepatic lesions are identified without contrast. No intrahepatic biliary dilatation. Small layering gallstones are noted the gallbladder. No gallbladder wall thickening is identified. There is a small amount of pericholecystic fluid noted. Normal caliber and course of the  common bile duct. No common bile duct stones. Pancreas: No mass, inflammation or ductal dilatation. Prominent fatty interstices noted. Spleen:  Normal size.  No focal lesions. Adrenals/Urinary Tract:  The adrenal glands are normal. Bilateral renal cysts.  No hydronephrosis. Stomach/Bowel: The stomach, duodenum, small bowel and colon are unremarkable. Vascular/Lymphatic: The aorta is normal in caliber. No mesenteric or retroperitoneal mass or adenopathy. Other:  No ascites or abdominal wall hernia. Musculoskeletal: No significant bony findings. IMPRESSION: 1. Cholelithiasis. No gallbladder wall thickening but there is small amount of pericholecystic fluid. Nuclear medicine hepatobiliary scan may be helpful to assess cystic duct patency. 2. Normal caliber and course of the common bile duct. No common bile duct stones. 3. No other acute abdominal findings, mass lesions or adenopathy. 4. Numerous bilateral renal cysts. Electronically Signed   By: Marijo Sanes M.D.   On: 09/10/2021 21:26   US ABDOMEN LIMITED RUQ (LIVER/GB)  Result Date: 09/10/2021 CLINICAL DATA:  Right upper quadrant pain over the last 2-3 weeks. EXAM: ULTRASOUND ABDOMEN LIMITED RIGHT UPPER QUADRANT COMPARISON:  CT same day FINDINGS: Gallbladder: Gallstones, the largest measuring 12 mm. Wall thickening measuring 4 mm. Tiny amount of pericholecystic fluid. No sonographic Murphy sign. None the less, the findings are worrisome for cholecystitis. Common bile duct: Diameter: 8 mm, dilated.  No visible stone. Liver: Increased echogenicity suggesting fatty change. No focal lesion. Portal vein is patent on color Doppler imaging with normal direction of blood flow towards the liver. Other: None. IMPRESSION: Chololithiasis. Largest stone 12 mm. Gallbladder wall thickening at 4 mm. Small amount of pericholecystic fluid. Findings worrisome for acute cholecystitis. Consider nuclear medicine study if concern persists. Prominent common bile duct at 8 mm diameter.  No ductal stone is identified. Electronically Signed   By: Nelson Chimes M.D.   On: 09/10/2021 14:28     LOS: 2 days   Antonieta Pert, MD [facility]  09/12/2021, 12:48 PM

## 2021-09-12 NOTE — Plan of Care (Signed)

## 2021-09-13 LAB — BASIC METABOLIC PANEL
Anion gap: 11 (ref 5–15)
BUN: 45 mg/dL — ABNORMAL HIGH (ref 6–20)
CO2: 22 mmol/L (ref 22–32)
Calcium: 8.1 mg/dL — ABNORMAL LOW (ref 8.9–10.3)
Chloride: 103 mmol/L (ref 98–111)
Creatinine, Ser: 4.02 mg/dL — ABNORMAL HIGH (ref 0.61–1.24)
GFR, Estimated: 17 mL/min — ABNORMAL LOW (ref >60)
Glucose, Bld: 105 mg/dL — ABNORMAL HIGH (ref 70–99)
Potassium: 4 mmol/L (ref 3.5–5.1)
Sodium: 136 mmol/L (ref 135–145)

## 2021-09-13 LAB — CBC
HCT: 41.5 % (ref 39.0–52.0)
Hemoglobin: 12.9 g/dL — ABNORMAL LOW (ref 13.0–17.0)
MCH: 25.2 pg — ABNORMAL LOW (ref 26.0–34.0)
MCHC: 31.1 g/dL (ref 30.0–36.0)
MCV: 81.1 fL (ref 80.0–100.0)
Platelets: 254 10*3/uL (ref 150–400)
RBC: 5.12 MIL/uL (ref 4.22–5.81)
RDW: 17.1 % — ABNORMAL HIGH (ref 11.5–15.5)
WBC: 7.3 10*3/uL (ref 4.0–10.5)
nRBC: 0 % (ref 0.0–0.2)

## 2021-09-13 NOTE — Progress Notes (Signed)
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 flutter on Eliquis, CKD lllb,  hx of GI bleed 2015, OSA, Presents to the emergency room with Right upper quadrantAbdominal pain for the past 2  right upper quadrant distention for the past few days associated with  nausea.  He denies fever, chills and diarrhea he does have a bit of dysuria.  He denies chest pain or shortness of breath.ED course and data review: BP elevated to 902/409 with diastolics going as high as 127.  Vitals otherwise unremarkable.Labs: WBC 7.8 hemoglobin 13.8.  CMP with creatinine at baseline at 3.4.  LFTs within normal limits except for elevated total bilirubin of 3.3.  Lipase normal at 25.  Urinalysis with negative nitrite small leukocyte esterase and no bacteria.  WBC over 50. Imaging CT abdomen and pelvis was concerning for acute cholecystitis Right upper quadrant ultrasound showed cholelithiasis and a prominent CBD of 8 mm with no ductal stone MRCP showed the following:"Cholelithiasis. No gallbladder wall thickening but there is small amount of pericholecystic fluid. Nuclear medicine hepatobiliary scan may be helpful to assess cystic duct patency. Dr. Hampton Abbot [facility] Surgery was consulted recommended a p.o. challenge, HIDA scan ordered. HIDA scan came back with patent cystic duct with low ejection fraction 6% normal 33%.  Likely has gallbladder dysfunction, placed on diet antibiotic discontinued, urine culture came back negative.  Patient was monitored to make sure he is able to tolerate diet.  His creatinine has up trended at 4.0 slightly high from baseline nephrology consulted 09/13/21    Subjective: Seen this am Reports he has been able to tolerate diet mild abdominal pain but overall  much improved.  Assessment and Plan: Principal Problem:   Right upper quadrant pain Active Problems:   Nausea and vomiting   Nonischemic cardiomyopathy: EF 20-25%   Paroxysmal atrial fibrillation (HCC)   Essential hypertension   Obstructive sleep apnea   CKD (chronic kidney disease) stage 4, GFR 15-29 ml/min (HCC)   Hypothyroidism  Right upper quadrant pain Nausea and vomiting Gallbladder dysfunction Cholelithiasis: Acute cholecystitis ruled out with negative HIDA scan suspect due to gallbladder dysfunction with low EF. Not tolerating low-fat diet low volume.  Not a candidate for surgical intervention, surgery signed off.  Continue pain control.   Nonischemic cardiomyopathy: EF 20-25%-TEE January 2023 with EF less than 20% No chest pain, is euvolemic.  Off IV fluids on oral diet, holding torsemide due to elevated creatinine. Continue rest of the home meds as blood pressure allows. On Coreg-dose decreased,on hydralazine-continue with holding parameters  Hypothyroidism: On levothyroxine  AKI on CKD stage 4: Creatinine is up trending nephrology consulted diuretics on hold, minimize hypotension, holding off on IV fluids due to advanced CHF.  Monitor.   Recent Labs  Lab 09/10/21 1115 09/11/21 1024 09/12/21 0557 09/13/21 0730  BUN 41* 44* 47* 45*  CREATININE 3.40* 3.72* 3.94* 4.02*    BDZ:HGDJ PAP Essential hypertension:y BP soft-adjusted med- coreg hydralazine Paroxysmal atrial fibrillation: Rate controlled on low dose coreg , eliquis. Class II Obesity:Patient's Body mass index is 37.27 kg/m. : Will benefit with PCP follow-up, weight loss  healthy lifestyle   DVT prophylaxis: SCD.eliquis Code Status:   Code Status: Full Code Family Communication: plan of care discussed with patient at bedside. Patient status is: Inpatient due  to gallbladder dysfunction with upper upper abdominal pain  Level of care: Med-Surg  Dispo: The patient is from: home            Anticipated  disposition: Home likely next 1 to 2 days once creatinine stabilizes and okay with nephrology   Mobility Assessment (last 72 hours)     Mobility Assessment     Row Name 09/13/21 0900 09/12/21 2028 09/11/21 2205 09/11/21 0900     Does patient have an order for bedrest or is patient medically unstable No - Continue assessment No - Continue assessment No - Continue assessment No - Continue assessment    What is the highest level of mobility based on the progressive mobility assessment? Level 6 (Walks independently in room and hall) - Balance while walking in room without assist - Complete Level 6 (Walks independently in room and hall) - Balance while walking in room without assist - Complete Level 5 (Walks with assist in room/hall) - Balance while stepping forward/back and can walk in room with assist - Complete Level 5 (Walks with assist in room/hall) - Balance while stepping forward/back and can walk in room with assist - Complete              Objective: Vitals last 24 hrs: Vitals:   09/12/21 1649 09/12/21 2002 09/13/21 0506 09/13/21 0748  BP: 106/79 118/89 101/73 (!) 128/92  Pulse: 68 67 72 74  Resp: 18 20 20 18   Temp: 98 F (36.7 C) 98.7 F (37.1 C) 98 F (36.7 C) 98.4 F (36.9 C)  TempSrc:  Oral Oral Oral  SpO2: 95% 94% 94% 96%  Weight:      Height:       Weight change:   Physical Examination: General exam: AAOX3, older than stated age, weak appearing. HEENT:Oral mucosa moist, Ear/Nose WNL grossly, dentition normal. Respiratory system: bilaterally diminished, no use of accessory muscle Cardiovascular system: S1 & S2 +, No JVD,. Gastrointestinal system: Abdomen soft, minimal tenderness Nervous System:Alert, awake, moving extremities and grossly nonfocal Extremities: LE ankle edema NEG, distal peripheral pulses palpable.  Skin: No rashes,no icterus. MSK: Normal muscle bulk,tone, power   Medications reviewed:  Scheduled Meds:  apixaban  5 mg Oral BID   atorvastatin   80 mg Oral Daily   carvedilol  6.25 mg Oral BID WC   dapagliflozin propanediol  10 mg Oral QAC breakfast   ferrous gluconate  324 mg Oral Q breakfast   hydrALAZINE  25 mg Oral TID   levothyroxine  100 mcg Oral QAC breakfast   potassium chloride SA  20 mEq Oral Daily   Continuous Infusions:      Diet Order             Diet Heart Room service appropriate? Yes; Fluid consistency: Thin  Diet effective now                  Intake/Output Summary (Last 24 hours) at 09/13/2021 1326 Last data filed at 09/12/2021 1909 Gross per 24 hour  Intake 240 ml  Output 500 ml  Net -260 ml   Net IO Since Admission: 595.42 mL [09/13/21 1326]  Wt Readings from Last 3 Encounters:  09/10/21 121.2 kg  08/19/21 121.2 kg  07/29/21 123.4 kg     Unresulted Labs (From admission, onward)     Start     Ordered   09/12/21 0500  CBC  Daily at 5am,   R      09/11/21 1051  Data Reviewed: I have personally reviewed following labs and imaging studies CBC: Recent Labs  Lab 09/10/21 1115 09/11/21 1024 09/12/21 0557 09/13/21 0641  WBC 7.8 7.4 6.9 7.3  HGB 13.8 13.5 13.3 12.9*  HCT 44.2 43.2 41.7 41.5  MCV 81.4 82.0 81.0 81.1  PLT 272 250 229 841   Basic Metabolic Panel: Recent Labs  Lab 09/10/21 1115 09/11/21 1024 09/12/21 0557 09/13/21 0730  NA 137 138 136 136  K 4.2 4.1 4.1 4.0  CL 104 104 105 103  CO2 23 25 23 22   GLUCOSE 108* 108* 124* 105*  BUN 41* 44* 47* 45*  CREATININE 3.40* 3.72* 3.94* 4.02*  CALCIUM 8.6* 8.5* 8.2* 8.1*   GFR: Estimated Creatinine Clearance: 26.9 mL/min (A) (by C-G formula based on SCr of 4.02 mg/dL (H)). Liver Function Tests: Recent Labs  Lab 09/10/21 1115 09/11/21 1024 09/12/21 0557  AST 26 31 29   ALT 27 28 31   ALKPHOS 116 112 105  BILITOT 3.3* 3.0* 2.6*  PROT 6.9 6.3* 6.0*  ALBUMIN 3.3* 3.1* 2.9*   Recent Labs  Lab 09/10/21 1115 09/11/21 1849  LIPASE 25 28   No results for input(s): "AMMONIA" in the last 168  hours. Coagulation Profile: No results for input(s): "INR", "PROTIME" in the last 168 hours. BNP (last 3 results) No results for input(s): "PROBNP" in the last 8760 hours. HbA1C: No results for input(s): "HGBA1C" in the last 72 hours. CBG: No results for input(s): "GLUCAP" in the last 168 hours. Lipid Profile: No results for input(s): "CHOL", "HDL", "LDLCALC", "TRIG", "CHOLHDL", "LDLDIRECT" in the last 72 hours. Thyroid Function Tests: No results for input(s): "TSH", "T4TOTAL", "FREET4", "T3FREE", "THYROIDAB" in the last 72 hours. Sepsis Labs: No results for input(s): "PROCALCITON", "LATICACIDVEN" in the last 168 hours.  Recent Results (from the past 240 hour(s))  Urine Culture     Status: None   Collection Time: 09/10/21  1:02 PM   Specimen: Urine, Clean Catch  Result Value Ref Range Status   Specimen Description   Final    URINE, CLEAN CATCH Performed at [facility], [address], [place], [place] [address]    Special Requests   Final    NONE Performed at [facility], [address] [place], [place] [address]    Culture   Final    NO GROWTH Performed at [facility], [place] [address] [place], [place] [address]    Report Status 09/12/2021 FINAL  Final    Antimicrobials: Anti-infectives (From admission, onward)    Start     Dose/Rate Route Frequency Ordered Stop   09/11/21 1100  piperacillin-tazobactam (ZOSYN) IVPB 3.375 g  Status:  Discontinued        3.375 g 12.5 mL/hr over 240 Minutes Intravenous Every 8 hours 09/11/21 1011 09/12/21 0851   09/10/21 2300  cefTRIAXone (ROCEPHIN) 1 g in sodium chloride 0.9 % 100 mL IVPB        1 g 200 mL/hr over 30 Minutes Intravenous  Once 09/10/21 2254 09/11/21 0052      Culture/Microbiology    Component Value Date/Time   SDES  09/10/2021 1302    URINE, CLEAN CATCH Performed at [facility], [address] [place], [place] [address]    [facility]  09/10/2021 1302    NONE Performed at  [facility], [address] [place], [place] [address]    CULT  09/10/2021 1302    NO GROWTH Performed at [facility], [place]  [address] [place], [place] [address]    REPTSTATUS 09/12/2021 FINAL 09/10/2021 1302    Other culture-see note  Radiology Studies: NM Hepato W/EF  Result Date: 09/11/2021 CLINICAL DATA:  Right upper quadrant pain. EXAM: NUCLEAR MEDICINE HEPATOBILIARY IMAGING WITH GALLBLADDER EF TECHNIQUE: Sequential images of the abdomen were obtained out to 60 minutes following intravenous administration of radiopharmaceutical. After oral ingestion of Ensure, gallbladder ejection fraction was determined. At 60 min, normal ejection fraction is greater than 33%. RADIOPHARMACEUTICALS:  7.43 mCi Tc-19m  Choletec IV COMPARISON:  MRCP 09/10/2021 and abdominal sonogram 09/10/2021. FINDINGS: Prompt uptake and biliary excretion of activity by the liver is seen. Gallbladder activity is visualized within the first hour, consistent with patency of cystic duct. Biliary activity passes into small bowel, consistent with patent common bile duct. Persistent intense tracer uptake is noted throughout the liver throughout the examination, which may reflect underlying hepatocyte dysfunction. Calculated gallbladder ejection fraction is 6%. (Normal gallbladder ejection fraction with Ensure is greater than 33%.) IMPRESSION: 1. Patent cystic duct without evidence for acute cholecystitis. 2. Abnormally low gallbladder ejection fraction equal to 6% indicative of decreased gallbladder emptying and gallbladder dysfunction. Electronically Signed   By: Kerby Moors M.D.   On: 09/11/2021 15:17     LOS: 3 days   Antonieta Pert, MD [facility]  09/13/2021, 1:26 PM

## 2021-09-14 LAB — BASIC METABOLIC PANEL
Anion gap: 10 (ref 5–15)
BUN: 44 mg/dL — ABNORMAL HIGH (ref 6–20)
CO2: 23 mmol/L (ref 22–32)
Calcium: 8.3 mg/dL — ABNORMAL LOW (ref 8.9–10.3)
Chloride: 103 mmol/L (ref 98–111)
Creatinine, Ser: 3.82 mg/dL — ABNORMAL HIGH (ref 0.61–1.24)
GFR, Estimated: 18 mL/min — ABNORMAL LOW (ref >60)
Glucose, Bld: 127 mg/dL — ABNORMAL HIGH (ref 70–99)
Potassium: 4.1 mmol/L (ref 3.5–5.1)
Sodium: 136 mmol/L (ref 135–145)

## 2021-09-14 LAB — CBC
HCT: 42 % (ref 39.0–52.0)
Hemoglobin: 12.9 g/dL — ABNORMAL LOW (ref 13.0–17.0)
MCH: 25 pg — ABNORMAL LOW (ref 26.0–34.0)
MCHC: 30.7 g/dL (ref 30.0–36.0)
MCV: 81.2 fL (ref 80.0–100.0)
Platelets: 243 10*3/uL (ref 150–400)
RBC: 5.17 MIL/uL (ref 4.22–5.81)
RDW: 17.2 % — ABNORMAL HIGH (ref 11.5–15.5)
WBC: 6.9 10*3/uL (ref 4.0–10.5)
nRBC: 0 % (ref 0.0–0.2)

## 2021-09-14 MED ORDER — CARVEDILOL 6.25 MG PO TABS: 6.2500 mg | ORAL_TABLET | Freq: Two times a day (BID) | ORAL | 0 refills | Status: DC

## 2021-09-14 NOTE — Progress Notes (Signed)
Discharge instructions were given to pt. Questions were encourage and answered. Pt denies pain at this time. IV was taken out and belongings were collected/given to the pt.

## 2021-09-14 NOTE — Plan of Care (Signed)

## 2021-09-14 NOTE — Progress Notes (Signed)
[facility]  ROUNDING NOTE   Subjective:   Hector [name] is a 56 year old male with past medical history included systolic heart failure with EF less than 20%, a flutter on Eliquis, CAD, and chronic kidney disease stage IV.  Patient presents to the emergency department with abdominal pain.  Patient has been admitted for Right upper quadrant abdominal pain [R10.11]  Patient was seen in our practice by Dr. Holley Raring previously but now follows with [facility] nephrology.  Patient was last seen on 06/11/2021.  Patient states abdominal pain began a few days prior along with nausea.  Denies vomiting or diarrhea.  Denies chest pain or discomfort.  Denies shortness of breath.  States he has continued to take all prescribed medications.  Labs on admission include creatinine 3.40 with GFR 20.  Baseline appears to be creatinine 3.17 on 08/19/2021.  UA appears hazy with some hematuria and proteinuria.  Urine culture negative.  CT abdomen pelvis showed bilateral renal cyst without hydronephrosis.  Abdominal ultrasound positive for gallstones.  MRCP negative.  We have been consulted to evaluate acute kidney injury.   Objective:  Vital signs in last 24 hours:  Temp:  [98 F (36.7 C)-98.3 F (36.8 C)] 98 F (36.7 C) (07/23 0746) Pulse Rate:  [66-73] 66 (07/23 0746) Resp:  [18-20] 18 (07/23 0746) BP: (110-122)/(76-92) 122/92 (07/23 0746) SpO2:  [94 %-97 %] 95 % (07/23 0746)  Weight change:  Filed Weights   09/10/21 1044  Weight: 121.2 kg    Intake/Output: I/O last 3 completed shifts: In: 240 [P.O.:240] Out: 900 [Urine:900]   Intake/Output this shift:  No intake/output data recorded.  Physical Exam: General: NAD, resting comfortably  Head: Normocephalic, atraumatic. Moist oral mucosal membranes  Eyes: Anicteric  Lungs:  Clear to auscultation, normal effort, room air  Heart: Regular rate and rhythm  Abdomen:  Soft, nontender  Extremities: No peripheral edema.  Neurologic:  Nonfocal, moving all four extremities  Skin: No lesions  Access: None    Basic Metabolic Panel: Recent Labs  Lab 09/10/21 1115 09/11/21 1024 09/12/21 0557 09/13/21 0730 09/14/21 0508  NA 137 138 136 136 136  K 4.2 4.1 4.1 4.0 4.1  CL 104 104 105 103 103  CO2 23 25 23 22 23   GLUCOSE 108* 108* 124* 105* 127*  BUN 41* 44* 47* 45* 44*  CREATININE 3.40* 3.72* 3.94* 4.02* 3.82*  CALCIUM 8.6* 8.5* 8.2* 8.1* 8.3*    Liver Function Tests: Recent Labs  Lab 09/10/21 1115 09/11/21 1024 09/12/21 0557  AST 26 31 29   ALT 27 28 31   ALKPHOS 116 112 105  BILITOT 3.3* 3.0* 2.6*  PROT 6.9 6.3* 6.0*  ALBUMIN 3.3* 3.1* 2.9*   Recent Labs  Lab 09/10/21 1115 09/11/21 1849  LIPASE 25 28   No results for input(s): "AMMONIA" in the last 168 hours.  CBC: Recent Labs  Lab 09/10/21 1115 09/11/21 1024 09/12/21 0557 09/13/21 0641 09/14/21 0508  WBC 7.8 7.4 6.9 7.3 6.9  HGB 13.8 13.5 13.3 12.9* 12.9*  HCT 44.2 43.2 41.7 41.5 42.0  MCV 81.4 82.0 81.0 81.1 81.2  PLT 272 250 229 254 243    Cardiac Enzymes: No results for input(s): "CKTOTAL", "CKMB", "CKMBINDEX", "TROPONINI" in the last 168 hours.  BNP: Invalid input(s): "POCBNP"  CBG: No results for input(s): "GLUCAP" in the last 168 hours.  Microbiology: Results for orders placed or performed during the hospital encounter of 09/10/21  Urine Culture     Status: None   Collection Time:  09/10/21  1:02 PM   Specimen: Urine, Clean Catch  Result Value Ref Range Status   Specimen Description   Final    URINE, CLEAN CATCH Performed at [facility], [address] [place], [place] [address]    Special Requests   Final    NONE Performed at [facility], [address] [place], [place] [address]    Culture   Final    NO GROWTH Performed at [facility], [place] [address], [place], [place] [address]    Report Status 09/12/2021 FINAL  Final    Coagulation Studies: No results for input(s): "LABPROT",  "INR" in the last 72 hours.  Urinalysis: No results for input(s): "COLORURINE", "LABSPEC", "PHURINE", "GLUCOSEU", "HGBUR", "BILIRUBINUR", "KETONESUR", "PROTEINUR", "UROBILINOGEN", "NITRITE", "LEUKOCYTESUR" in the last 72 hours.  Invalid input(s): "APPERANCEUR"    Imaging: No results found.   Medications:     apixaban  5 mg Oral BID   atorvastatin  80 mg Oral Daily   carvedilol  6.25 mg Oral BID WC   ferrous gluconate  324 mg Oral Q breakfast   hydrALAZINE  25 mg Oral TID   levothyroxine  100 mcg Oral QAC breakfast   acetaminophen **OR** acetaminophen, HYDROcodone-acetaminophen, morphine injection, ondansetron **OR** ondansetron (ZOFRAN) IV  Assessment/ Plan:  Hector [name] is a 56 y.o.  male with past medical history included systolic heart failure with EF less than 20%, a flutter on Eliquis, CAD, and chronic kidney disease stage IV.  Patient presents to the emergency department with abdominal pain.  Patient has been admitted for Right upper quadrant abdominal pain [R10.11]   Acute Kidney Injury on chronic kidney disease stage V with baseline creatinine 3.17 and GFR of 22 on 08/19/21.  Acute kidney injury secondary to hypovolemia from nausea and vomiting prior to admission. Chronic kidney disease is secondary to hypertension and cardiac disease.  No IV contrast exposure.  CT abdomen pelvis showed bilateral nonobstructive renal cyst.  Patient received diuretic yesterday.  Held due to worsening creatinine yesterday.  Patient reports tolerating meals today.  Creatinine improved.  Adequate urine output recorded, 900 mL overnight.  Patient cleared to discharge from renal stance to follow-up with [facility] physicians.  Lab Results  Component Value Date   CREATININE 3.82 (H) 09/14/2021   CREATININE 4.02 (H) 09/13/2021   CREATININE 3.94 (H) 09/12/2021    Intake/Output Summary (Last 24 hours) at 09/14/2021 1050 Last data filed at 09/14/2021 0700 Gross per 24 hour  Intake --  Output  900 ml  Net -900 ml   2. Anemia of chronic kidney disease Lab Results  Component Value Date   HGB 12.9 (L) 09/14/2021    Hemoglobin within acceptable range.  We will continue to monitor  3. Secondary Hyperparathyroidism: with outpatient labs:  Lab Results  Component Value Date   CALCIUM 8.3 (L) 09/14/2021   CAION 1.12 (L) 01/30/2020   PHOS 2.9 09/16/2020    Calcium and phosphorus within acceptable range.  We will continue to monitor bone minerals.  4.  Hypertension with chronic kidney disease.  Home regimen includes carvedilol, hydralazine, and torsemide.  Torsemide currently held in setting of worsening renal function.  Blood pressure currently 122/92.   LOS: 4   7/23/202310:50 AM

## 2021-09-14 NOTE — Discharge Summary (Signed)
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 flutter on Eliquis, CKD lllb,  hx of GI bleed 2015, OSA, Presents to the emergency room with Right upper quadrantAbdominal pain for the past 2  right upper quadrant distention for the past few days associated with  nausea.  He denies fever, chills and diarrhea he does have a bit of dysuria.  He denies chest pain or shortness of breath.ED course and data review: BP elevated to 259/563 with diastolics going as high as 127.  Vitals otherwise unremarkable.Labs: WBC 7.8 hemoglobin 13.8.  CMP with creatinine at baseline at 3.4.  LFTs within normal limits except for elevated total bilirubin of 3.3.  Lipase normal at 25.  Urinalysis with negative nitrite small leukocyte esterase and no bacteria.  WBC over 50. Imaging CT abdomen and pelvis was concerning for acute cholecystitis Right upper quadrant ultrasound showed cholelithiasis and a prominent CBD of 8 mm with no ductal stone MRCP showed the following:"Cholelithiasis. No gallbladder wall thickening but there is small amount of pericholecystic fluid. Nuclear medicine hepatobiliary scan may be helpful to assess cystic duct patency. Dr. Hampton Abbot Gen Surgery was consulted recommended a p.o. challenge, HIDA scan ordered. HIDA scan came back with patent cystic duct with low ejection fraction 6% normal 33%.  Likely has  gallbladder dysfunction, placed on diet antibiotic discontinued, urine culture came back negative.  Patient was monitored to make sure he is able to tolerate diet.  His creatinine has up trended at 4.0 slightly high from baseline nephrology consulted .  Right upper quadrant pain Nausea and vomiting Gallbladder dysfunction Cholelithiasis: Acute cholecystitis ruled out with negative HIDA scan suspect due to gallbladder dysfunction with low EF. Not tolerating low-fat diet Not a candidate for surgical intervention, surgery signed off. Now tolerating heart healthy diet   Nonischemic cardiomyopathy: EF 20-25%-TEE January 2023 with EF less than 20% No chest pain, is euvolemic.  holding torsemide due to elevated creatinine. Continue rest of the home meds as blood pressure allows. On Coreg-dose decreased,on hydralazine   Hypothyroidism: On levothyroxine   AKI on CKD stage 4:  Acute Kidney Injury on chronic kidney disease stage V with baseline creatinine 3.17 and GFR of 22 on 08/19/21.  Acute kidney injury secondary to hypovolemia from nausea and vomiting prior to admission. Chronic kidney disease is secondary to hypertension and cardiac disease.  No IV contrast exposure.  CT abdomen pelvis showed bilateral nonobstructive renal cyst.  Patient received diuretic yesterday.  Held due to worsening creatinine yesterday.  Creatinine improved.  Adequate urine output recorded, 900 mL overnight.  Patient cleared to discharge from renal stance to follow-up with [facility] physicians. Renal recommend to hold diuretics until follows up with nephrology. Discussed with patient about this.    OVF:IEPP PAP Essential hypertension:coreg decreased to 6.25mg  bid. Continue hydralazine.  Paroxysmal atrial fibrillation: Rate controlled on low dose coreg , eliquis. Class II Obesity:Patient's Body mass index is 37.27 kg/m. : Will benefit with PCP follow-up,  weight loss  healthy lifestyle         Discharge Diagnoses:  Principal  Problem:   Right upper quadrant pain Active Problems:   Nausea and vomiting   Nonischemic cardiomyopathy: EF 20-25%   Paroxysmal atrial fibrillation (HCC)   Essential hypertension   Obstructive sleep apnea   CKD (chronic kidney disease) stage 4, GFR 15-29 ml/min (HCC)   Hypothyroidism    Discharge Instructions  Discharge Instructions     Call MD for:  difficulty breathing, headache or visual disturbances   Complete by: As directed    Diet - low sodium heart healthy   Complete by: As directed    Discharge instructions   Complete by: As directed    Hold diuretics until you follow-up with your heart doctor.  Please call their office and follow-up next week.  Also follow-up with your primary care and kidney [facility] at [facility].   Increase activity slowly   Complete by: As directed       Allergies as of 09/14/2021       Reactions   Esomeprazole Magnesium Other (See Comments)   Suspected interstitial nephritis 2018   Eggs Or Egg-derived Products Rash        Medication List     STOP taking these medications    potassium chloride SA 20 MEQ tablet Commonly known as: KLOR-CON M   torsemide 20 MG tablet Commonly known as: DEMADEX       TAKE these medications    allopurinol 100 MG tablet Commonly known as: ZYLOPRIM Take 1 tablet by mouth daily. Taking a 1/2 tablet   apixaban 5 MG Tabs tablet Commonly known as: ELIQUIS Take 1 tablet (5 mg total) by mouth 2 (two) times daily.   atorvastatin 80 MG tablet Commonly known as: LIPITOR Take 1 tablet (80 mg total) by mouth daily.   carvedilol 6.25 MG tablet Commonly known as: COREG Take 1 tablet (6.25 mg total) by mouth 2 (two) times daily with a meal. What changed:  medication strength how much to take   dapagliflozin propanediol 10 MG Tabs tablet Commonly known as: Farxiga Take 1 tablet (10 mg total) by mouth daily before breakfast.   ferrous gluconate 324 MG tablet Commonly known as: FERGON Take 324 mg by mouth  daily with breakfast.   hydrALAZINE 25 MG tablet Commonly known as: APRESOLINE Take 1 tablet (25 mg total) by mouth 3 (three) times daily.   levothyroxine 100 MCG tablet Commonly known as: SYNTHROID Take 100 mcg by mouth daily before breakfast.        Follow-up Information     Alisa Graff, FNP Follow up.   Specialty: Family Medicine Contact information: [place] [address] [phone]                Allergies  Allergen Reactions   Esomeprazole Magnesium Other (See Comments)    Suspected interstitial nephritis 2018   Eggs Or Egg-Derived Products Rash    Consultations: Nephrology and surgery   Procedures/Studies: NM Hepato W/EF  Result Date: 09/11/2021 CLINICAL DATA:  Right upper quadrant pain. EXAM: NUCLEAR MEDICINE HEPATOBILIARY IMAGING WITH GALLBLADDER EF TECHNIQUE: Sequential images of the abdomen were obtained out to 60 minutes following intravenous administration of radiopharmaceutical. After oral ingestion of Ensure, gallbladder ejection fraction was determined. At 60 min, normal ejection fraction is greater than 33%. RADIOPHARMACEUTICALS:  7.43 mCi Tc-4m  Choletec IV COMPARISON:  MRCP 09/10/2021 and abdominal sonogram 09/10/2021. FINDINGS: Prompt uptake and biliary excretion  of activity by the liver is seen. Gallbladder activity is visualized within the first hour, consistent with patency of cystic duct. Biliary activity passes into small bowel, consistent with patent common bile duct. Persistent intense tracer uptake is noted throughout the liver throughout the examination, which may reflect underlying hepatocyte dysfunction. Calculated gallbladder ejection fraction is 6%. (Normal gallbladder ejection fraction with Ensure is greater than 33%.) IMPRESSION: 1. Patent cystic duct without evidence for acute cholecystitis. 2. Abnormally low gallbladder ejection fraction equal to 6% indicative of decreased gallbladder emptying  and gallbladder dysfunction. Electronically Signed   By: Kerby Moors M.D.   On: 09/11/2021 15:17   MR ABDOMEN MRCP WO CONTRAST  Result Date: 09/10/2021 CLINICAL DATA:  Abdominal distension and right upper quadrant abdominal pain. Cholelithiasis seen on ultrasound examination. EXAM: MRI ABDOMEN WITHOUT CONTRAST  (INCLUDING MRCP) TECHNIQUE: Multiplanar multisequence MR imaging of the abdomen was performed. Heavily T2-weighted images of the biliary and pancreatic ducts were obtained, and three-dimensional MRCP images were rendered by post processing. COMPARISON:  CT and ultrasound examinations, same date. FINDINGS: Lower chest: The lung bases are grossly clear. No pleural or pericardial effusion. Hepatobiliary: No hepatic lesions are identified without contrast. No intrahepatic biliary dilatation. Small layering gallstones are noted the gallbladder. No gallbladder wall thickening is identified. There is a small amount of pericholecystic fluid noted. Normal caliber and course of the common bile duct. No common bile duct stones. Pancreas: No mass, inflammation or ductal dilatation. Prominent fatty interstices noted. Spleen:  Normal size.  No focal lesions. Adrenals/Urinary Tract:  The adrenal glands are normal. Bilateral renal cysts.  No hydronephrosis. Stomach/Bowel: The stomach, duodenum, small bowel and colon are unremarkable. Vascular/Lymphatic: The aorta is normal in caliber. No mesenteric or retroperitoneal mass or adenopathy. Other:  No ascites or abdominal wall hernia. Musculoskeletal: No significant bony findings. IMPRESSION: 1. Cholelithiasis. No gallbladder wall thickening but there is small amount of pericholecystic fluid. Nuclear medicine hepatobiliary scan may be helpful to assess cystic duct patency. 2. Normal caliber and course of the common bile duct. No common bile duct stones. 3. No other acute abdominal findings, mass lesions or adenopathy. 4. Numerous bilateral renal cysts. Electronically  Signed   By: Marijo Sanes M.D.   On: 09/10/2021 21:26   US ABDOMEN LIMITED RUQ (LIVER/GB)  Result Date: 09/10/2021 CLINICAL DATA:  Right upper quadrant pain over the last 2-3 weeks. EXAM: ULTRASOUND ABDOMEN LIMITED RIGHT UPPER QUADRANT COMPARISON:  CT same day FINDINGS: Gallbladder: Gallstones, the largest measuring 12 mm. Wall thickening measuring 4 mm. Tiny amount of pericholecystic fluid. No sonographic Murphy sign. None the less, the findings are worrisome for cholecystitis. Common bile duct: Diameter: 8 mm, dilated.  No visible stone. Liver: Increased echogenicity suggesting fatty change. No focal lesion. Portal vein is patent on color Doppler imaging with normal direction of blood flow towards the liver. Other: None. IMPRESSION: Chololithiasis. Largest stone 12 mm. Gallbladder wall thickening at 4 mm. Small amount of pericholecystic fluid. Findings worrisome for acute cholecystitis. Consider nuclear medicine study if concern persists. Prominent common bile duct at 8 mm diameter. No ductal stone is identified. Electronically Signed   By: Nelson Chimes M.D.   On: 09/10/2021 14:28   CT ABDOMEN PELVIS WO CONTRAST  Result Date: 09/10/2021 CLINICAL DATA:  Acute, nonlocalized abdominal pain with bowel obstruction suspected EXAM: CT ABDOMEN AND PELVIS WITHOUT CONTRAST TECHNIQUE: Multidetector CT imaging of the abdomen and pelvis was performed following the standard protocol without IV contrast. RADIATION DOSE REDUCTION: This exam was  performed according to the departmental dose-optimization program which includes automated exposure control, adjustment of the mA and/or kV according to patient size and/or use of iterative reconstruction technique. COMPARISON:  05/06/2021 FINDINGS: Lower chest: Calcified nodules in the right lower lobe as previously noted. Interlobular septal thickening at the lateral costophrenic sulci, possibly scarring given stability from prior. Hepatobiliary: No focal liver  abnormality.Prominent size of the caudate lobe with surface lobulation. The umbilical vein is also conspicuous. Stranding at the hepatic hilum adjacent to the gallbladder which is only moderately distended but does contain calculi. Pancreas: Generalized fatty atrophy Spleen: Unremarkable. Adrenals/Urinary Tract: Negative adrenals. No hydronephrosis or stone. Bilateral renal cysts measuring up to 5.1 cm at the right lower pole and on the left up to 4 cm at the upper pole. Subcentimeter presumed hemorrhagic cyst in the interpolar left kidney, interval circumferential thick walled bladder, chronic. Stomach/Bowel: No obstruction. Right upper quadrant inflammation but not associated with thickening of adjacent stomach or duodenum. Vascular/Lymphatic: No acute vascular abnormality. Atherosclerosis. No mass or adenopathy. Reproductive:No pathologic findings. Other: Trace pelvic fluid. No pneumoperitoneum. Fatty right groin and umbilical hernias. Musculoskeletal: No acute abnormalities. IMPRESSION: 1. Right upper quadrant fat stranding in close proximity to the gallbladder which contains calculi, question symptoms of cholecystitis. 2. Possible cirrhosis, please correlate for risk factors. 3. Fatty umbilical and right groin hernias 4. Atherosclerosis. Electronically Signed   By: Jorje Guild M.D.   On: 09/10/2021 12:58      Subjective: No sob, cp, abd pain, nausea or vomiting  Discharge Exam: Vitals:   09/13/21 2119 09/14/21 0746  BP: 110/76 (!) 122/92  Pulse: 68 66  Resp: 20 18  Temp: 98.3 F (36.8 C) 98 F (36.7 C)  SpO2: 94% 95%   Vitals:   09/13/21 0748 09/13/21 1600 09/13/21 2119 09/14/21 0746  BP: (!) 128/92 111/79 110/76 (!) 122/92  Pulse: 74 73 68 66  Resp: 18 18 20 18   Temp: 98.4 F (36.9 C) 98 F (36.7 C) 98.3 F (36.8 C) 98 F (36.7 C)  TempSrc: Oral Oral Oral   SpO2: 96% 97% 94% 95%  Weight:      Height:        General: Pt is alert, awake, not in acute  distress Cardiovascular: RRR, S1/S2 +, no rubs, no gallops Respiratory: CTA bilaterally, no wheezing, no rhonchi Abdominal: Soft, NT, ND, bowel sounds + Extremities: no edema, no cyanosis    The results of significant diagnostics from this hospitalization (including imaging, microbiology, ancillary and laboratory) are listed below for reference.     Microbiology: Recent Results (from the past 240 hour(s))  Urine Culture     Status: None   Collection Time: 09/10/21  1:02 PM   Specimen: Urine, Clean Catch  Result Value Ref Range Status   Specimen Description   Final    URINE, CLEAN CATCH Performed at [facility], [address], [place], [place] [address]    Special Requests   Final    NONE Performed at [facility], [address] [place], [place] [address]    Culture   Final    NO GROWTH Performed at [facility], [place] [address] [place], [place] [address]    Report Status 09/12/2021 FINAL  Final     Labs: BNP (last 3 results) Recent Labs    07/22/21 0910 07/28/21 1446 07/29/21 0931  BNP 1,343.0* 1,996.7* 0,998.3*   Basic Metabolic Panel: Recent Labs  Lab 09/10/21 1115 09/11/21 1024 09/12/21 0557 09/13/21  0730 09/14/21 0508  NA 137 138 136 136 136  K 4.2 4.1 4.1 4.0 4.1  CL 104 104 105 103 103  CO2 23 25 23 22 23   GLUCOSE 108* 108* 124* 105* 127*  BUN 41* 44* 47* 45* 44*  CREATININE 3.40* 3.72* 3.94* 4.02* 3.82*  CALCIUM 8.6* 8.5* 8.2* 8.1* 8.3*   Liver Function Tests: Recent Labs  Lab 09/10/21 1115 09/11/21 1024 09/12/21 0557  AST 26 31 29   ALT 27 28 31   ALKPHOS 116 112 105  BILITOT 3.3* 3.0* 2.6*  PROT 6.9 6.3* 6.0*  ALBUMIN 3.3* 3.1* 2.9*   Recent Labs  Lab 09/10/21 1115 09/11/21 1849  LIPASE 25 28   No results for input(s): "AMMONIA" in the last 168 hours. CBC: Recent Labs  Lab 09/10/21 1115 09/11/21 1024 09/12/21 0557 09/13/21 0641 09/14/21 0508  WBC 7.8 7.4 6.9 7.3 6.9  HGB 13.8 13.5 13.3 12.9*  12.9*  HCT 44.2 43.2 41.7 41.5 42.0  MCV 81.4 82.0 81.0 81.1 81.2  PLT 272 250 229 254 243   Cardiac Enzymes: No results for input(s): "CKTOTAL", "CKMB", "CKMBINDEX", "TROPONINI" in the last 168 hours. BNP: Invalid input(s): "POCBNP" CBG: No results for input(s): "GLUCAP" in the last 168 hours. D-Dimer No results for input(s): "DDIMER" in the last 72 hours. Hgb A1c No results for input(s): "HGBA1C" in the last 72 hours. Lipid Profile No results for input(s): "CHOL", "HDL", "LDLCALC", "TRIG", "CHOLHDL", "LDLDIRECT" in the last 72 hours. Thyroid function studies No results for input(s): "TSH", "T4TOTAL", "T3FREE", "THYROIDAB" in the last 72 hours.  Invalid input(s): "FREET3" Anemia work up No results for input(s): "VITAMINB12", "FOLATE", "FERRITIN", "TIBC", "IRON", "RETICCTPCT" in the last 72 hours. Urinalysis    Component Value Date/Time   COLORURINE YELLOW (A) 09/10/2021 1302   APPEARANCEUR HAZY (A) 09/10/2021 1302   APPEARANCEUR Clear 02/26/2014 1126   LABSPEC 1.010 09/10/2021 1302   LABSPEC 1.008 02/26/2014 1126   PHURINE 5.0 09/10/2021 1302   GLUCOSEU NEGATIVE 09/10/2021 1302   GLUCOSEU Negative 02/26/2014 1126   HGBUR MODERATE (A) 09/10/2021 1302   BILIRUBINUR NEGATIVE 09/10/2021 1302   BILIRUBINUR Negative 02/26/2014 1126   KETONESUR NEGATIVE 09/10/2021 1302   PROTEINUR >=300 (A) 09/10/2021 1302   NITRITE NEGATIVE 09/10/2021 1302   LEUKOCYTESUR SMALL (A) 09/10/2021 1302   LEUKOCYTESUR 1+ 02/26/2014 1126   Sepsis Labs Recent Labs  Lab 09/11/21 1024 09/12/21 0557 09/13/21 0641 09/14/21 0508  WBC 7.4 6.9 7.3 6.9   Microbiology Recent Results (from the past 240 hour(s))  Urine Culture     Status: None   Collection Time: 09/10/21  1:02 PM   Specimen: Urine, Clean Catch  Result Value Ref Range Status   Specimen Description   Final    URINE, CLEAN CATCH Performed at [facility], [address] [place], [place] [address]    Special Requests    Final    NONE Performed at [facility], [address] [place], [place] [address]    Culture   Final    NO GROWTH Performed at [facility], [place] [address] [place], [place] [address]    Report Status 09/12/2021 FINAL  Final     Time coordinating discharge: Over 30 minutes  SIGNED:   Nolberto Hanlon, MD  [facility] 09/14/2021, 11:31 AM Pager   If 7PM-7AM, please contact night-coverage [email or web address] Password [email or web address]

## 2021-09-14 NOTE — Progress Notes (Deleted)
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 flutter on Eliquis, CKD lllb,  hx of GI bleed 2015, OSA, Presents to the emergency room with Right upper quadrantAbdominal pain for the past 2  right upper quadrant distention for the past few days associated with  nausea.  He denies fever, chills and diarrhea he does have a bit of dysuria.  He denies chest pain or shortness of breath.ED course and data review: BP elevated to 503/546 with diastolics going as high as 127.  Vitals otherwise unremarkable.Labs: WBC 7.8 hemoglobin 13.8.  CMP with creatinine at baseline at 3.4.  LFTs within normal limits except for elevated total bilirubin of 3.3.  Lipase normal at 25.  Urinalysis with negative nitrite small leukocyte esterase and no bacteria.  WBC over 50. Imaging CT abdomen and pelvis was concerning for acute cholecystitis Right upper quadrant ultrasound showed cholelithiasis and a prominent CBD of 8 mm with no ductal stone MRCP showed the following:"Cholelithiasis. No gallbladder wall thickening but there is small amount of pericholecystic fluid. Nuclear medicine hepatobiliary scan may be helpful to assess cystic duct patency. Dr. Hampton Abbot [facility] Surgery was consulted recommended a p.o. challenge, HIDA scan ordered. HIDA scan came back with patent cystic duct with low ejection fraction 6% normal 33%.  Likely has gallbladder dysfunction, placed on diet antibiotic discontinued, urine culture came back negative.  Patient was monitored to make sure he is able to tolerate diet.  His creatinine has up trended at 4.0 slightly high from baseline nephrology consulted 09/13/21     7/23 creatinine 3.82.  Mild abdominal pain   Subjective: No chest pain or shortness of  breath  Assessment and Plan: Principal Problem:   Right upper quadrant pain Active Problems:   Nausea and vomiting   Nonischemic cardiomyopathy: EF 20-25%   Paroxysmal atrial fibrillation (HCC)   Essential hypertension   Obstructive sleep apnea   CKD (chronic kidney disease) stage 4, GFR 15-29 ml/min (HCC)   Hypothyroidism  Right upper quadrant pain Nausea and vomiting Gallbladder dysfunction Cholelithiasis: Acute cholecystitis ruled out with negative HIDA scan suspect due to gallbladder dysfunction with low EF. Not tolerating low-fat diet low volume.  Not a candidate for surgical intervention, surgery signed off. 7/23 monitor diet to see if tolerates Pain control  Nonischemic cardiomyopathy: EF 20-25%-TEE January 2023 with EF less than 20% No chest pain, is euvolemic.  Off IV fluids on oral diet, holding torsemide due to elevated creatinine. Continue rest of the home meds as blood pressure allows. On Coreg-dose decreased,on hydralazine-continue with holding parameters 7/23 holding torsemide due to creatinine rising Appears to be euvolemic monitor closely Daily weight, I's and O's    Hypothyroidism:  On levothyroxine  AKI on CKD stage 4:  Creatinine was rising, diuretics on hold Cr down some today Nephrology consulted-pending   FKC:LEXN PAP Essential hypertension:y BP soft-adjusted med- coreg hydralazine Paroxysmal atrial fibrillation: Rate controlled on low dose coreg , eliquis. Class II Obesity:Patient's Body mass index is 37.27 kg/m. : Will benefit with PCP follow-up, weight loss  healthy lifestyle   DVT prophylaxis: SCD.eliquis Code Status: full  Family Communication: none at bedside Patient status is: Inpatient due to gallbladder dysfunction with upper upper abdominal pain. Renal fxn worsening  Level of care: Med-Surg  Dispo: The patient is  from: home            Anticipated disposition: Home likely next 1 to 2 days once creatinine stabilizes and okay with  nephrology   Mobility Assessment (last 72 hours)     Mobility Assessment     Row Name 09/13/21 2230 09/13/21 0900 09/12/21 2028 09/11/21 2205 09/11/21 0900   Does patient have an order for bedrest or is patient medically unstable No - Continue assessment No - Continue assessment No - Continue assessment No - Continue assessment No - Continue assessment   What is the highest level of mobility based on the progressive mobility assessment? Level 6 (Walks independently in room and hall) - Balance while walking in room without assist - Complete Level 6 (Walks independently in room and hall) - Balance while walking in room without assist - Complete Level 6 (Walks independently in room and hall) - Balance while walking in room without assist - Complete Level 5 (Walks with assist in room/hall) - Balance while stepping forward/back and can walk in room with assist - Complete Level 5 (Walks with assist in room/hall) - Balance while stepping forward/back and can walk in room with assist - Complete             Objective: Vitals last 24 hrs: Vitals:   09/13/21 0748 09/13/21 1600 09/13/21 2119 09/14/21 0746  BP: (!) 128/92 111/79 110/76 (!) 122/92  Pulse: 74 73 68 66  Resp: 18 18 20 18   Temp: 98.4 F (36.9 C) 98 F (36.7 C) 98.3 F (36.8 C) 98 F (36.7 C)  TempSrc: Oral Oral Oral   SpO2: 96% 97% 94% 95%  Weight:      Height:       Weight change:   Physical Examination: Calm, NAD Cta no w/r Reg s1/s2 no gallop Soft benign +bs No edema Aaoxox3  Mood and affect appropriate in current setting   Medications reviewed:  Scheduled Meds:  apixaban  5 mg Oral BID   atorvastatin  80 mg Oral Daily   carvedilol  6.25 mg Oral BID WC   ferrous gluconate  324 mg Oral Q breakfast   hydrALAZINE  25 mg Oral TID   levothyroxine  100 mcg Oral QAC breakfast   potassium chloride SA  20 mEq Oral Daily   Continuous Infusions:      Diet Order             Diet Heart Room service appropriate?  Yes; Fluid consistency: Thin  Diet effective now                  Intake/Output Summary (Last 24 hours) at 09/14/2021 0858 Last data filed at 09/14/2021 0700 Gross per 24 hour  Intake --  Output 900 ml  Net -900 ml   Net IO Since Admission: -304.58 mL [09/14/21 0858]  Wt Readings from Last 3 Encounters:  09/10/21 121.2 kg  08/19/21 121.2 kg  07/29/21 123.4 kg     Unresulted Labs (From admission, onward)     Start     Ordered   09/14/21 1610  Basic metabolic panel  Once,   R       Question:  Specimen collection method  Answer:  Lab=Lab collect   09/14/21 0837   09/12/21 0500  CBC  Daily at 5am,   R      09/11/21 1051          Data Reviewed: I have personally reviewed following labs and imaging studies CBC: Recent Labs  Lab 09/10/21 1115 09/11/21 1024 09/12/21 0557 09/13/21 0641 09/14/21 0508  WBC 7.8 7.4 6.9 7.3 6.9  HGB 13.8 13.5 13.3 12.9* 12.9*  HCT 44.2 43.2 41.7 41.5 42.0  MCV 81.4 82.0 81.0 81.1 81.2  PLT 272 250 229 254 578   Basic Metabolic Panel: Recent Labs  Lab 09/10/21 1115 09/11/21 1024 09/12/21 0557 09/13/21 0730  NA 137 138 136 136  K 4.2 4.1 4.1 4.0  CL 104 104 105 103  CO2 23 25 23 22   GLUCOSE 108* 108* 124* 105*  BUN 41* 44* 47* 45*  CREATININE 3.40* 3.72* 3.94* 4.02*  CALCIUM 8.6* 8.5* 8.2* 8.1*   GFR: Estimated Creatinine Clearance: 26.9 mL/min (A) (by C-G formula based on SCr of 4.02 mg/dL (H)). Liver Function Tests: Recent Labs  Lab 09/10/21 1115 09/11/21 1024 09/12/21 0557  AST 26 31 29   ALT 27 28 31   ALKPHOS 116 112 105  BILITOT 3.3* 3.0* 2.6*  PROT 6.9 6.3* 6.0*  ALBUMIN 3.3* 3.1* 2.9*   Recent Labs  Lab 09/10/21 1115 09/11/21 1849  LIPASE 25 28   No results for input(s): "AMMONIA" in the last 168 hours. Coagulation Profile: No results for input(s): "INR", "PROTIME" in the last 168 hours. BNP (last 3 results) No results for input(s): "PROBNP" in the last 8760 hours. HbA1C: No results for input(s):  "HGBA1C" in the last 72 hours. CBG: No results for input(s): "GLUCAP" in the last 168 hours. Lipid Profile: No results for input(s): "CHOL", "HDL", "LDLCALC", "TRIG", "CHOLHDL", "LDLDIRECT" in the last 72 hours. Thyroid Function Tests: No results for input(s): "TSH", "T4TOTAL", "FREET4", "T3FREE", "THYROIDAB" in the last 72 hours. Sepsis Labs: No results for input(s): "PROCALCITON", "LATICACIDVEN" in the last 168 hours.  Recent Results (from the past 240 hour(s))  Urine Culture     Status: None   Collection Time: 09/10/21  1:02 PM   Specimen: Urine, Clean Catch  Result Value Ref Range Status   Specimen Description   Final    URINE, CLEAN CATCH Performed at [facility], [address] [place], [place] [address]    Special Requests   Final    NONE Performed at [facility], [address], [place], [place] [address]    Culture   Final    NO GROWTH Performed at [facility], [place] [address], [place], [place] [address]    Report Status 09/12/2021 FINAL  Final    Antimicrobials: Anti-infectives (From admission, onward)    Start     Dose/Rate Route Frequency Ordered Stop   09/11/21 1100  piperacillin-tazobactam (ZOSYN) IVPB 3.375 g  Status:  Discontinued        3.375 g 12.5 mL/hr over 240 Minutes Intravenous Every 8 hours 09/11/21 1011 09/12/21 0851   09/10/21 2300  cefTRIAXone (ROCEPHIN) 1 g in sodium chloride 0.9 % 100 mL IVPB        1 g 200 mL/hr over 30 Minutes Intravenous  Once 09/10/21 2254 09/11/21 0052      Culture/Microbiology    Component Value Date/Time   SDES  09/10/2021 1302    URINE, CLEAN CATCH Performed at [facility], [address] [place], [place] [address]    [facility]  09/10/2021 1302    NONE Performed at [facility], [address] [place], [place] [address]    CULT  09/10/2021 1302    NO GROWTH Performed at [facility], [place] [address] [place], [place] [address]    REPTSTATUS  09/12/2021 FINAL 09/10/2021 1302    Other culture-see note  Radiology Studies: No results found.   LOS: 4 days  \ Time spent: 82min  Nolberto Hanlon, MD [facility]  09/14/2021, 8:58 AM

## 2021-09-16 ENCOUNTER — Telehealth: Payer: Self-pay | Admitting: Cardiovascular Disease

## 2021-09-16 NOTE — Telephone Encounter (Signed)
-----   Message from Theora Gianotti, NP sent at 09/15/2021  5:29 PM EDT ----- Regarding: f/u Hi,  Would you pls arrange for 1 wk f/u in either CHF clinic or w/ cadence, ryan, or TG?  Figure CHF clinic might be path of least resistance.  If that's where he lands, he should still have f/u here in a month.  cb

## 2021-09-16 NOTE — Telephone Encounter (Signed)
Scheduled:  Darylene Price 8/1 at 11 am  Cadence Furth 8/22 at 245 pm  Patient aware and confirmed appts

## 2021-09-23 ENCOUNTER — Ambulatory Visit: Payer: Medicaid Other | Admitting: Family

## 2021-09-23 ENCOUNTER — Telehealth: Payer: Self-pay | Admitting: Family

## 2021-09-23 NOTE — Telephone Encounter (Signed)
Patient did not show for his Heart Failure Clinic appointment on 09/23/21. Will attempt to reschedule.

## 2021-09-28 ENCOUNTER — Emergency Department: Payer: Medicaid Other

## 2021-09-28 ENCOUNTER — Inpatient Hospital Stay
Admission: EM | Admit: 2021-09-28 | Discharge: 2021-10-02 | DRG: 291 | Disposition: A | Discharge status: Home / Self Care | Payer: Medicaid Other | Attending: Family Medicine | Admitting: Family Medicine

## 2021-09-28 ENCOUNTER — Other Ambulatory Visit: Payer: Self-pay

## 2021-09-28 DIAGNOSIS — Z888 Allergy status to other drugs, medicaments and biological substances status: Secondary | ICD-10-CM

## 2021-09-28 DIAGNOSIS — Z79899 Other long term (current) drug therapy: Secondary | ICD-10-CM | POA: Diagnosis not present

## 2021-09-28 DIAGNOSIS — Z91148 Patient's other noncompliance with medication regimen for other reason: Secondary | ICD-10-CM

## 2021-09-28 DIAGNOSIS — N184 Chronic kidney disease, stage 4 (severe): Secondary | ICD-10-CM | POA: Diagnosis present

## 2021-09-28 DIAGNOSIS — E1122 Type 2 diabetes mellitus with diabetic chronic kidney disease: Secondary | ICD-10-CM | POA: Diagnosis present

## 2021-09-28 DIAGNOSIS — I4891 Unspecified atrial fibrillation: Secondary | ICD-10-CM | POA: Diagnosis not present

## 2021-09-28 DIAGNOSIS — Z833 Family history of diabetes mellitus: Secondary | ICD-10-CM | POA: Diagnosis not present

## 2021-09-28 DIAGNOSIS — I10 Essential (primary) hypertension: Secondary | ICD-10-CM | POA: Diagnosis present

## 2021-09-28 DIAGNOSIS — E039 Hypothyroidism, unspecified: Secondary | ICD-10-CM | POA: Diagnosis present

## 2021-09-28 DIAGNOSIS — I5043 Acute on chronic combined systolic (congestive) and diastolic (congestive) heart failure: Secondary | ICD-10-CM | POA: Diagnosis present

## 2021-09-28 DIAGNOSIS — I4892 Unspecified atrial flutter: Secondary | ICD-10-CM | POA: Diagnosis present

## 2021-09-28 DIAGNOSIS — D509 Iron deficiency anemia, unspecified: Secondary | ICD-10-CM | POA: Diagnosis present

## 2021-09-28 DIAGNOSIS — E785 Hyperlipidemia, unspecified: Secondary | ICD-10-CM | POA: Diagnosis present

## 2021-09-28 DIAGNOSIS — Z8673 Personal history of transient ischemic attack (TIA), and cerebral infarction without residual deficits: Secondary | ICD-10-CM | POA: Diagnosis not present

## 2021-09-28 DIAGNOSIS — Z91012 Allergy to eggs: Secondary | ICD-10-CM

## 2021-09-28 DIAGNOSIS — I428 Other cardiomyopathies: Secondary | ICD-10-CM | POA: Diagnosis not present

## 2021-09-28 DIAGNOSIS — Z6835 Body mass index (BMI) 35.0-35.9, adult: Secondary | ICD-10-CM

## 2021-09-28 DIAGNOSIS — I480 Paroxysmal atrial fibrillation: Secondary | ICD-10-CM | POA: Diagnosis present

## 2021-09-28 DIAGNOSIS — E782 Mixed hyperlipidemia: Secondary | ICD-10-CM | POA: Diagnosis present

## 2021-09-28 DIAGNOSIS — I5023 Acute on chronic systolic (congestive) heart failure: Secondary | ICD-10-CM | POA: Diagnosis not present

## 2021-09-28 DIAGNOSIS — M109 Gout, unspecified: Secondary | ICD-10-CM | POA: Diagnosis present

## 2021-09-28 DIAGNOSIS — Z7901 Long term (current) use of anticoagulants: Secondary | ICD-10-CM | POA: Diagnosis not present

## 2021-09-28 DIAGNOSIS — E669 Obesity, unspecified: Secondary | ICD-10-CM | POA: Diagnosis present

## 2021-09-28 DIAGNOSIS — I426 Alcoholic cardiomyopathy: Secondary | ICD-10-CM | POA: Diagnosis present

## 2021-09-28 DIAGNOSIS — Z87891 Personal history of nicotine dependence: Secondary | ICD-10-CM | POA: Diagnosis not present

## 2021-09-28 DIAGNOSIS — Z83438 Family history of other disorder of lipoprotein metabolism and other lipidemia: Secondary | ICD-10-CM

## 2021-09-28 DIAGNOSIS — I5021 Acute systolic (congestive) heart failure: Secondary | ICD-10-CM | POA: Diagnosis not present

## 2021-09-28 DIAGNOSIS — G4733 Obstructive sleep apnea (adult) (pediatric): Secondary | ICD-10-CM | POA: Diagnosis present

## 2021-09-28 DIAGNOSIS — I5022 Chronic systolic (congestive) heart failure: Secondary | ICD-10-CM | POA: Diagnosis present

## 2021-09-28 DIAGNOSIS — N189 Chronic kidney disease, unspecified: Secondary | ICD-10-CM | POA: Diagnosis not present

## 2021-09-28 DIAGNOSIS — N183 Chronic kidney disease, stage 3 unspecified: Secondary | ICD-10-CM | POA: Diagnosis present

## 2021-09-28 DIAGNOSIS — I130 Hypertensive heart and chronic kidney disease with heart failure and stage 1 through stage 4 chronic kidney disease, or unspecified chronic kidney disease: Principal | ICD-10-CM | POA: Diagnosis present

## 2021-09-28 DIAGNOSIS — I5082 Biventricular heart failure: Secondary | ICD-10-CM | POA: Diagnosis present

## 2021-09-28 DIAGNOSIS — Z8249 Family history of ischemic heart disease and other diseases of the circulatory system: Secondary | ICD-10-CM | POA: Diagnosis not present

## 2021-09-28 LAB — COMPREHENSIVE METABOLIC PANEL
ALT: 17 U/L (ref 0–44)
AST: 24 U/L (ref 15–41)
Albumin: 3.3 g/dL — ABNORMAL LOW (ref 3.5–5.0)
Alkaline Phosphatase: 92 U/L (ref 38–126)
Anion gap: 11 (ref 5–15)
BUN: 39 mg/dL — ABNORMAL HIGH (ref 6–20)
CO2: 22 mmol/L (ref 22–32)
Calcium: 8.6 mg/dL — ABNORMAL LOW (ref 8.9–10.3)
Chloride: 105 mmol/L (ref 98–111)
Creatinine, Ser: 3.25 mg/dL — ABNORMAL HIGH (ref 0.61–1.24)
GFR, Estimated: 21 mL/min — ABNORMAL LOW (ref >60)
Glucose, Bld: 105 mg/dL — ABNORMAL HIGH (ref 70–99)
Potassium: 4.1 mmol/L (ref 3.5–5.1)
Sodium: 138 mmol/L (ref 135–145)
Total Bilirubin: 3.8 mg/dL — ABNORMAL HIGH (ref 0.3–1.2)
Total Protein: 6.4 g/dL — ABNORMAL LOW (ref 6.5–8.1)

## 2021-09-28 LAB — CBC WITH DIFFERENTIAL/PLATELET
Abs Immature Granulocytes: 0.03 10*3/uL (ref 0.00–0.07)
Basophils Absolute: 0.1 10*3/uL (ref 0.0–0.1)
Basophils Relative: 1 %
Eosinophils Absolute: 0.1 10*3/uL (ref 0.0–0.5)
Eosinophils Relative: 1 %
HCT: 42.3 % (ref 39.0–52.0)
Hemoglobin: 13 g/dL (ref 13.0–17.0)
Immature Granulocytes: 0 %
Lymphocytes Relative: 23 %
Lymphs Abs: 1.7 10*3/uL (ref 0.7–4.0)
MCH: 24.9 pg — ABNORMAL LOW (ref 26.0–34.0)
MCHC: 30.7 g/dL (ref 30.0–36.0)
MCV: 81 fL (ref 80.0–100.0)
Monocytes Absolute: 0.8 10*3/uL (ref 0.1–1.0)
Monocytes Relative: 10 %
Neutro Abs: 4.7 10*3/uL (ref 1.7–7.7)
Neutrophils Relative %: 65 %
Platelets: 294 10*3/uL (ref 150–400)
RBC: 5.22 MIL/uL (ref 4.22–5.81)
RDW: 18.8 % — ABNORMAL HIGH (ref 11.5–15.5)
WBC: 7.3 10*3/uL (ref 4.0–10.5)
nRBC: 0 % (ref 0.0–0.2)

## 2021-09-28 LAB — TSH: TSH: 4.547 u[IU]/mL — ABNORMAL HIGH (ref 0.350–4.500)

## 2021-09-28 LAB — LIPASE, BLOOD: Lipase: 26 U/L (ref 11–51)

## 2021-09-28 LAB — MAGNESIUM: Magnesium: 2.1 mg/dL (ref 1.7–2.4)

## 2021-09-28 LAB — BRAIN NATRIURETIC PEPTIDE: B Natriuretic Peptide: 3806.7 pg/mL — ABNORMAL HIGH (ref 0.0–100.0)

## 2021-09-28 MED ORDER — HYDRALAZINE HCL 20 MG/ML IJ SOLN: 5.0000 mg | Freq: Three times a day (TID) | INTRAMUSCULAR | Status: DC | PRN

## 2021-09-28 MED ORDER — SODIUM CHLORIDE 0.9% FLUSH
3.0000 mL | Freq: Two times a day (BID) | INTRAVENOUS | Status: DC
  Administered 2021-09-29 – 2021-10-02 (×6): 3 mL via INTRAVENOUS

## 2021-09-28 MED ORDER — ALLOPURINOL 100 MG PO TABS
50.0000 mg | ORAL_TABLET | Freq: Every day | ORAL | Status: DC
  Administered 2021-09-28 – 2021-10-02 (×5): 50 mg via ORAL
  Filled 2021-09-28 (×5): qty 1

## 2021-09-28 MED ORDER — CARVEDILOL 6.25 MG PO TABS: 6.2500 mg | ORAL_TABLET | Freq: Two times a day (BID) | ORAL | Status: DC

## 2021-09-28 MED ORDER — APIXABAN 5 MG PO TABS
5.0000 mg | ORAL_TABLET | Freq: Two times a day (BID) | ORAL | Status: DC
  Administered 2021-09-28 – 2021-10-02 (×8): 5 mg via ORAL
  Filled 2021-09-28 (×9): qty 1

## 2021-09-28 MED ORDER — ACETAMINOPHEN 650 MG RE SUPP: 650.0000 mg | Freq: Four times a day (QID) | RECTAL | Status: DC | PRN

## 2021-09-28 MED ORDER — HYDRALAZINE HCL 25 MG PO TABS
25.0000 mg | ORAL_TABLET | Freq: Three times a day (TID) | ORAL | Status: DC
  Administered 2021-09-28 – 2021-10-02 (×11): 25 mg via ORAL
  Filled 2021-09-28 (×12): qty 1

## 2021-09-28 MED ORDER — FUROSEMIDE 10 MG/ML IJ SOLN
80.0000 mg | Freq: Two times a day (BID) | INTRAMUSCULAR | Status: DC
  Administered 2021-09-29 – 2021-10-02 (×7): 80 mg via INTRAVENOUS
  Filled 2021-09-28 (×7): qty 8

## 2021-09-28 MED ORDER — FUROSEMIDE 10 MG/ML IJ SOLN
80.0000 mg | Freq: Once | INTRAMUSCULAR | Status: AC
  Administered 2021-09-28: 80 mg via INTRAVENOUS
  Filled 2021-09-28: qty 8

## 2021-09-28 MED ORDER — FERROUS GLUCONATE 324 (38 FE) MG PO TABS
324.0000 mg | ORAL_TABLET | Freq: Every day | ORAL | Status: DC
  Administered 2021-09-29 – 2021-10-02 (×4): 324 mg via ORAL
  Filled 2021-09-28 (×4): qty 1

## 2021-09-28 MED ORDER — DAPAGLIFLOZIN PROPANEDIOL 10 MG PO TABS
10.0000 mg | ORAL_TABLET | Freq: Every day | ORAL | Status: DC
  Administered 2021-09-29 – 2021-10-02 (×4): 10 mg via ORAL
  Filled 2021-09-28 (×4): qty 1

## 2021-09-28 MED ORDER — CARVEDILOL 6.25 MG PO TABS
6.2500 mg | ORAL_TABLET | Freq: Once | ORAL | Status: AC
Start: 2021-09-28 — End: 2021-09-28
  Administered 2021-09-28: 6.25 mg via ORAL
  Filled 2021-09-28: qty 1

## 2021-09-28 MED ORDER — SODIUM CHLORIDE 0.9% FLUSH
3.0000 mL | Freq: Two times a day (BID) | INTRAVENOUS | Status: DC
Start: 2021-09-28 — End: 2021-10-02
  Administered 2021-09-28 – 2021-10-02 (×8): 3 mL via INTRAVENOUS

## 2021-09-28 MED ORDER — SODIUM CHLORIDE 0.9% FLUSH: 3.0000 mL | INTRAVENOUS | Status: DC | PRN

## 2021-09-28 MED ORDER — OXYCODONE HCL 5 MG PO TABS
5.0000 mg | ORAL_TABLET | ORAL | Status: DC | PRN
  Administered 2021-09-28 – 2021-10-02 (×11): 5 mg via ORAL
  Filled 2021-09-28 (×11): qty 1

## 2021-09-28 MED ORDER — ONDANSETRON HCL 4 MG PO TABS: 4.0000 mg | ORAL_TABLET | Freq: Four times a day (QID) | ORAL | Status: DC | PRN

## 2021-09-28 MED ORDER — CARVEDILOL 6.25 MG PO TABS
6.2500 mg | ORAL_TABLET | Freq: Two times a day (BID) | ORAL | Status: DC
Start: 2021-09-29 — End: 2021-10-02
  Administered 2021-09-29 – 2021-10-02 (×7): 6.25 mg via ORAL
  Filled 2021-09-28 (×7): qty 1

## 2021-09-28 MED ORDER — FUROSEMIDE 10 MG/ML IJ SOLN: 80.0000 mg | Freq: Two times a day (BID) | INTRAMUSCULAR | Status: DC

## 2021-09-28 MED ORDER — HYDRALAZINE HCL 50 MG PO TABS
25.0000 mg | ORAL_TABLET | Freq: Once | ORAL | Status: AC
Start: 2021-09-28 — End: 2021-09-28
  Administered 2021-09-28: 25 mg via ORAL
  Filled 2021-09-28: qty 1

## 2021-09-28 MED ORDER — ACETAMINOPHEN 325 MG PO TABS: 650.0000 mg | ORAL_TABLET | Freq: Four times a day (QID) | ORAL | Status: DC | PRN

## 2021-09-28 MED ORDER — ONDANSETRON HCL 4 MG/2ML IJ SOLN: 4.0000 mg | Freq: Four times a day (QID) | INTRAMUSCULAR | Status: DC | PRN

## 2021-09-28 MED ORDER — SODIUM CHLORIDE 0.9 % IV SOLN: 250.0000 mL | INTRAVENOUS | Status: DC | PRN

## 2021-09-28 MED ORDER — ATORVASTATIN CALCIUM 80 MG PO TABS
80.0000 mg | ORAL_TABLET | Freq: Every day | ORAL | Status: DC
  Administered 2021-09-28 – 2021-10-02 (×5): 80 mg via ORAL
  Filled 2021-09-28 (×5): qty 1

## 2021-09-28 MED ORDER — SENNOSIDES-DOCUSATE SODIUM 8.6-50 MG PO TABS: 1.0000 | ORAL_TABLET | Freq: Every evening | ORAL | Status: DC | PRN

## 2021-09-28 NOTE — ED Notes (Signed)
Advised nurse that patient has ready bed 

## 2021-09-28 NOTE — ED Provider Notes (Signed)
[facility] Provider Note    Event Date/Time   First MD Initiated Contact with Patient 09/28/21 1502     (approximate)   History   Abdominal Pain   HPI  Hector [name] is a 56 y.o. male with past medical history of congestive heart failure, hypertension, chronic nonadherence, here with abdominal pain.  The patient states that over the last several weeks, has had progressively worsening abdominal swelling, leg swelling, and fullness.  He was seen here and started on torsemide.  He states he has been taking this but has not had any improvement.  Has had worsening dyspnea and now essentially cannot walk across the room without getting very dyspneic.  He states that he missed his heart failure clinic appointment and this was confirmed by review of records.  States he wants to have fluid drained off of his abdomen because his mother has had that done and she felt much better.  Denies known history of cirrhosis.  He does not know how much he has gained in terms of weight.  No chest pain.  He has early satiety but no nausea or vomiting.  No change in bowel habits.  No fevers.     Physical Exam   Triage Vital Signs: ED Triage Vitals  Enc Vitals Group     BP 09/28/21 1350 (!) 153/121     Pulse Rate 09/28/21 1350 89     Resp 09/28/21 1350 17     Temp 09/28/21 1350 97.8 F (36.6 C)     Temp Source 09/28/21 1350 Oral     SpO2 09/28/21 1346 96 %     Weight 09/28/21 1349 266 lb 12.1 oz (121 kg)     Height 09/28/21 1349 5\' 11"  (1.803 m)     Head Circumference --      Peak Flow --      Pain Score 09/28/21 1349 8     Pain Loc --      Pain Edu? --      Excl. in [place]? --     Most recent vital signs: Vitals:   09/28/21 1600 09/28/21 1630  BP: (!) 151/123 (!) 169/136  Pulse: 86 93  Resp: 14 (!) 24  Temp:    SpO2: 95% (!) 89%     General: Awake, no distress.  CV:  Good peripheral perfusion.  Regular rate and rhythm. Resp:  Normal effort.  Mild  tachypnea. Abd:  No distention.  Distended, abdominal wall edema.  No guarding or rebound. Other:  2+ pitting edema bilateral lower extremities.   ED Results / Procedures / Treatments   Labs (all labs ordered are listed, but only abnormal results are displayed) Labs Reviewed  COMPREHENSIVE METABOLIC PANEL - Abnormal; Notable for the following components:      Result Value   Glucose, Bld 105 (*)    BUN 39 (*)    Creatinine, Ser 3.25 (*)    Calcium 8.6 (*)    Total Protein 6.4 (*)    Albumin 3.3 (*)    Total Bilirubin 3.8 (*)    GFR, Estimated 21 (*)    All other components within normal limits  CBC WITH DIFFERENTIAL/PLATELET - Abnormal; Notable for the following components:   MCH 24.9 (*)    RDW 18.8 (*)    All other components within normal limits  LIPASE, BLOOD  BRAIN NATRIURETIC PEPTIDE     EKG normal sinus rhythm, ventricular rate 89.  PR 173, QRS 134, QTc 519.  Left bundle branch block.  No acute ST elevations or depressions.    RADIOLOGY CXR: Mixed pattern pulmonary edema   I also independently reviewed and agree with radiologist interpretations.   PROCEDURES:  Critical Care performed: No  .1-3 Lead EKG Interpretation  Performed by: Duffy Bruce, MD Authorized by: Duffy Bruce, MD     Interpretation: normal     ECG rate:  80-90s   ECG rate assessment: normal     Rhythm: sinus rhythm     Ectopy: none     Conduction: normal   Comments:     Indication: CHF, SOB     MEDICATIONS ORDERED IN ED: Medications  carvedilol (COREG) tablet 6.25 mg (has no administration in time range)  hydrALAZINE (APRESOLINE) tablet 25 mg (has no administration in time range)  furosemide (LASIX) injection 80 mg (80 mg Intravenous Given 09/28/21 1529)     IMPRESSION / MDM / ASSESSMENT AND PLAN / ED COURSE  I reviewed the triage vital signs and the nursing notes.                               The patient is on the cardiac monitor to evaluate for evidence of  arrhythmia and/or significant heart rate changes.   Ddx:  Differential includes the following, with pertinent life- or limb-threatening emergencies considered:  CHF exacerbation with anasarca, ascites from cirrhosis/congestive hepatopathy, renal failure, hypoalbuminemia, GERD/gastritis, gastroparesis, obstruction  Patient's presentation is most consistent with acute presentation with potential threat to life or bodily function.  MDM:  56 yo M with PMHx CHF (EF 20% on last echo), nonadherence, here with SOB, abd distension, leg swelling. Suspect acute CHF, with minimal improvement on torsemide. Pt will need IV diuresis, cautious in setting of his CKD though clinically he appears overtly hypervolemic. CXR shows pulm edema. Labs reviewed, remarkable for baseline CKD with Cr 3.25, unremarkable CBC without leukocytosis or anemia. Lipase normal. EKG nonischemic. BNP pending. Will give IV lasix, admit to medicine. Pt updated and In agreement. Last Echo 02/2021 showed EF<20%.   MEDICATIONS GIVEN IN ED: Medications  carvedilol (COREG) tablet 6.25 mg (has no administration in time range)  hydrALAZINE (APRESOLINE) tablet 25 mg (has no administration in time range)  furosemide (LASIX) injection 80 mg (80 mg Intravenous Given 09/28/21 1529)     Consults:  Hospitalist   EMR reviewed  Reviewed prior ED notes and notes from heart failure clinic, pt no -showed for 8/1 appointment per records -Reviewed echo 02/2021 EF<20%     FINAL CLINICAL IMPRESSION(S) / ED DIAGNOSES   Final diagnoses:  Acute on chronic systolic congestive heart failure ([place])     Rx / DC Orders   ED Discharge Orders     None        Note:  This document was prepared using Dragon voice recognition software and may include unintentional dictation errors.   Duffy Bruce, MD 09/28/21 [phone]

## 2021-09-28 NOTE — ED Notes (Signed)
Pt given a urinal.

## 2021-09-28 NOTE — ED Triage Notes (Signed)
Pt was admitted for same complaint a week ago. Pt was DC on Sunday and sent home with a fluid pill. Pt states the fluid pill is not working and he is swelling and has pain in his abdomen.

## 2021-09-28 NOTE — H&P (Signed)
History and Physical    Hector [name] Hector [name]:[id number] DOB: February 14, 1966 DOA: 09/28/2021  PCP: [name], MD  Patient coming from: Home  Chief Complaint: Increased abdominal swelling  HPI: Hector [name] is a 56 y.o. male with medical history significant of AUD (sober X 11 years), HFrEF (last EF < 20%), NICM, CKD stage 3b, HTN, HLD, Afib on eliquis, h/o stroke, hypothyroidism, gout who presents for swelling.  Hector [name] notes that he has had increased swelling.  In June he saw HF clinic and he was switched to torsemide.  However, he did not feel that this was helping.  He was admitted in July and was stopped off all diuretics at discharge due to CKD worsening.  He was supposed to follow up with Cardiology but missed that appointment.  He notes not taking his medication X 1.5 weeks due to not eating much.  He felt he was only supposed to take his medications if he took them with food, so he has not taken them at all.  He has had decreased PO intake, nausea with foot intake, swelling of the abdomen and legs, difficulty lying flat due to SOB, increased urination.  He has not had any chest pain, fever, chills, emesis, change in bowels.  He has not been weighing himself, however, interestingly his weight today is 121kg and on discharge on 7/23 his weight was 121.2kg.  He has not tolerated ACE or ARB in the past due to hyperkalemia and is not on entresto due to renal function.   ED Course: In the ED, he was found to have a BUn of 39, Cr of 3.25 which is improved from discharge in July.  He has a low calcium and low albumin.  His total bilirubin remains high, up to 3.8.  GFR is 21.  He had a BNP of 3800, up from 1700 in June of this year.  His CBC is relatively normal, H/H are within normal limits.  CXR showed mixed pulmonary edema.  Last TTE from January of this year shows an EF of < 20%.    Review of Systems: As per HPI otherwise all other systems reviewed and are negative.  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 12/2007 MV: EF 28%, no isch;  b. 8/12 Echo: EF 25-35%; c. 02/2014 Echo: EF 20-25%; d. 12/2014 Cath: minimal CAD; e. 01/2015 Echo: EF 50-55%;  d. 05/2016 Echo: EF 30-35%, diff HK, gr2 DD; e. 11/2016 Echo: EF 45-50%, diff HK.   Chronic combined systolic (congestive) and diastolic (congestive) heart failure ([place])    a. 05/2016 Most recent Echo: EF 30-35%, diff HK, Gr2 DD, mild MR, mod dil LA; b. 11/2016 Echo: EF 45-50%, diff HK.   CKD (chronic kidney disease), stage III (HCC)    Elevated troponin    Chronically elevated. - level was 0.17-0.10 during recent admission   Essential hypertension    GI bleed 11/2013   Hyperlipidemia    Pancreatitis    Paroxysmal A-fib (HCC)    a. new onset s/p unsuccessful TEE/DCCV on 08/16/2014; b. on amio/eliquis (CHA2DS2VASc = 2-3); c. reports 1-2 hrs of afib ~ q50mos.   Paroxysmal atrial flutter ([place])    a. new onset 07/2014; b. s/p unsuccessful TEE/DCCV 08/16/2014; c. on apixaban.   Sleep-disordered breathing    Has yet to have a sleep study   Stroke [facility])     Past Surgical History:  Procedure Laterality  Date   CARDIAC CATHETERIZATION N/A 01/09/2015   Procedure: Left Heart Cath and Coronary Angiography;  Surgeon: [name], MD;  Location: [facility];  Service: Cardiovascular;  Laterality: N/A;   CARDIOVERSION N/A 03/05/2021   Procedure: CARDIOVERSION;  Surgeon: Kate Sable, MD;  Location: [facility] ORS;  Service: Cardiovascular;  Laterality: N/A;   COLONOSCOPY N/A 07/22/2020   Procedure: COLONOSCOPY;  Surgeon: Toledo, Benay Pike, MD;  Location: [facility] ENDOSCOPY;  Service: Gastroenterology;  Laterality: N/A;   ELECTROPHYSIOLOGIC STUDY N/A 08/16/2014   Procedure: CARDIOVERSION;  Surgeon: [name], MD;  Location: [facility] OR[facility];  Service: Cardiovascular;  Laterality: N/A;   FLEXIBLE SIGMOIDOSCOPY N/A 10/10/2015   Procedure: FLEXIBLE SIGMOIDOSCOPY;  Surgeon:  [name], MD;  Location: [facility] ENDOSCOPY;  Service: Endoscopy;  Laterality: N/A;   KNEE SURGERY Right    NM MYOVIEW LTD  November 2011   No ischemia or infarction. EF 50-55% ( no improvement from 2009 Myoview EF of 28%   TEE WITHOUT CARDIOVERSION N/A 08/16/2014   Procedure: TRANSESOPHAGEAL ECHOCARDIOGRAM (TEE);  Surgeon: [name], MD;  Location: [facility] OR[facility];  Service: Cardiovascular;  Laterality: N/A;   TEE WITHOUT CARDIOVERSION N/A 03/05/2021   Procedure: TRANSESOPHAGEAL ECHOCARDIOGRAM (TEE);  Surgeon: Kate Sable, MD;  Location: [facility] ORS;  Service: Cardiovascular;  Laterality: N/A;    Social History  reports that he quit smoking about 23 years ago. His smoking use included cigarettes. He has a 12.00 pack-year smoking history. He has never used smokeless tobacco. He reports that he does not currently use alcohol. He reports that he does not use drugs.  Allergies  Allergen Reactions   Esomeprazole Magnesium Other (See Comments)    Suspected interstitial nephritis 2018   Eggs Or Egg-Derived Products Rash   Reports that he did not know his father well.  Family History  Problem Relation Age of Onset   Hypertension Mother    Hyperlipidemia Mother    Diabetes Mother     Not taking anything for at least 1.5 weeks.  Prior to Admission medications   Medication Sig Start Date End Date Taking? Authorizing Provider  allopurinol (ZYLOPRIM) 100 MG tablet Take 1 tablet by mouth daily. Taking a 1/2 tablet 02/14/21 02/14/22  [provider]  apixaban (ELIQUIS) 5 MG TABS tablet Take 1 tablet (5 mg total) by mouth 2 (two) times daily. 05/13/21   [name], MD  atorvastatin (LIPITOR) 80 MG tablet Take 1 tablet (80 mg total) by mouth daily. 05/13/21   [name], MD  carvedilol (COREG) 6.25 MG tablet Take 1 tablet (6.25 mg total) by mouth 2 (two) times daily with a meal. 09/14/21 10/14/21  Nolberto Hanlon, MD  dapagliflozin propanediol (FARXIGA) 10 MG TABS tablet Take  1 tablet (10 mg total) by mouth daily before breakfast. 05/13/21   Gollan, Kathlene November, MD  ferrous gluconate (FERGON) 324 MG tablet Take 324 mg by mouth daily with breakfast.    [provider]  hydrALAZINE (APRESOLINE) 25 MG tablet Take 1 tablet (25 mg total) by mouth 3 (three) times daily. 05/13/21   [name], MD  levothyroxine (SYNTHROID) 100 MCG tablet Take 100 mcg by mouth daily before breakfast.    [provider]    Physical Exam: Vitals:   09/28/21 1530 09/28/21 1600 09/28/21 1630 09/28/21 1737  BP: (!) 148/102 (!) 151/123 (!) 169/136 (!) 163/136  Pulse: 87 86 93   Resp: (!) 23 14 (!) 24   Temp: 98 F (36.7 C)  TempSrc: Oral     SpO2: 95% 95% (!) 89%   Weight:      Height:        Constitutional: Sitting up on side of bed, unable to lie reclined, wearing jeans which makes full exam difficult.  Eyes: lids and conjunctivae normal ENMT: Mucous membranes are moist.  Neck: normal, supple Respiratory: Decreased breath sounds at bases, some very mild crackles.  No wheezing Cardiovascular: RR, NR, no murmur noted.  JVD to jaw and increased with hepatic pressure, 2+ pitting to mid thigh on the left and 1+ pitting to the same on the right. Abdomen: no tenderness, he was not able to lie flat, but he did not have any edema to the skin of the abdominal wall or sacrum, +BS Musculoskeletal: no clubbing / cyanosis. No joint deformities in the hands.  Skin: Dry skin to the legs, some erythema of the left leg, though not warm, appears chronic.  Neurologic: Grossly intact, speech is clear Psychiatric: Normal judgment, insight lacking (requesting paracentesis)   Labs on Admission: I have personally reviewed following labs and imaging studies  CBC: Recent Labs  Lab 09/28/21 1403  WBC 7.3  NEUTROABS 4.7  HGB 13.0  HCT 42.3  MCV 81.0  PLT 606    Basic Metabolic Panel: Recent Labs  Lab 09/28/21 1403  NA 138  K 4.1  CL 105  CO2 22  GLUCOSE 105*  BUN  39*  CREATININE 3.25*  CALCIUM 8.6*    GFR: Estimated Creatinine Clearance: 33.2 mL/min (A) (by C-G formula based on SCr of 3.25 mg/dL (H)).  Liver Function Tests: Recent Labs  Lab 09/28/21 1403  AST 24  ALT 17  ALKPHOS 92  BILITOT 3.8*  PROT 6.4*  ALBUMIN 3.3*    Radiological Exams on Admission: DG Chest 2 View  Result Date: 09/28/2021 CLINICAL DATA:  Shortness of breath. EXAM: CHEST - 2 VIEW COMPARISON:  Jul 22, 2021 FINDINGS: Enlarged cardiac silhouette. Mixed pattern pulmonary edema. There is no evidence of focal airspace consolidation, pleural effusion or pneumothorax. Osseous structures are without acute abnormality. Soft tissues are grossly normal. IMPRESSION: Enlarged cardiac silhouette with mixed pattern pulmonary edema. Electronically Signed   By: Fidela Salisbury M.D.   On: 09/28/2021 15:52     Assessment/Plan Acute on chronic HFrEF (heart failure with reduced ejection fraction) (HCC) Nonischemic cardiomyopathy: EF 20-25% Hyperbilirubinemia - BNP elevated, swelling noted in abdomen and legs - Admit to telemetry - Monitor pulse ox and HR - Daily weights, I/Os - IV lasix 80mg  BID - Hold beta blocker given in acute exacerbation and not taking at home - Restart farxiga, hydralazine, atorvastatin - Consider Cardiology consult in the AM - Will get an EKG as well - Check magnesium - Hyperbilirubinemia likely related to volume, but would trend and work up if not improving with diuresis, appears chronic - Repeat TTE, last in 02/2021 - Oxygen ordered if needed   Chronic kidney disease (CKD), stage IV - Improved from discharge, but still at stage 4 - Monitor with diuresis - Hold diuresis for worsening - Bicarb normal   Hyperlipidemia - Restart statin    Paroxysmal atrial fibrillation - Pulse is controlled currently - Hold beta blocker as above - Diuresis - Restart eliquis    Essential hypertension - Restart hydralazine and farxiga - Hold beta  blocker  Iron deficiency anemia - Restart iron supplementation    Hypothyroidism - Check TSH - Restart synthroid if indicated.     DVT prophylaxis: Eliquis  Code  Status:   Full   Disposition Plan:   Patient is from:  Home  Anticipated DC to:  Home  Anticipated DC date:  10/03/21  Anticipated DC barriers: Medication cost  Consults called:  None  Admission status:  IP telemetry   Severity of Illness: The appropriate patient status for this patient is INPATIENT. Inpatient status is judged to be reasonable and necessary in order to provide the required intensity of service to ensure the patient's safety. The patient's presenting symptoms, physical exam findings, and initial radiographic and laboratory data in the context of their chronic comorbidities is felt to place them at high risk for further clinical deterioration. Furthermore, it is not anticipated that the patient will be medically stable for discharge from the hospital within 2 midnights of admission.   * I certify that at the point of admission it is my clinical judgment that the patient will require inpatient hospital care spanning beyond 2 midnights from the point of admission due to high intensity of service, high risk for further deterioration and high frequency of surveillance required.Gilles Chiquito MD [facility]  How to contact the [facility] Attending or Consulting provider [place] or covering provider during after hours [place], for this patient?   Check the care team in [facility] and look for a) attending/consulting [facility] provider listed and b) the [facility] team listed Log into [email or web address] and use [place]'s universal password to access. If you do not have the password, please contact the hospital operator. Locate the [facility] provider you are looking for under [facility] and page to a number that you can be directly reached. If you still have difficulty reaching the provider, please page the [facility] (Director on Call)  for the Hospitalists listed on amion for assistance.  09/28/2021, 5:43 PM

## 2021-09-28 NOTE — ED Notes (Signed)
Pt took leads, BP cuff and pulse ox off.

## 2021-09-29 ENCOUNTER — Inpatient Hospital Stay (HOSPITAL_COMMUNITY)
Admit: 2021-09-29 | Discharge: 2021-09-29 | Disposition: A | Discharge status: Home / Self Care | Payer: Medicaid Other | Attending: Internal Medicine | Admitting: Internal Medicine

## 2021-09-29 DIAGNOSIS — I5023 Acute on chronic systolic (congestive) heart failure: Secondary | ICD-10-CM | POA: Diagnosis not present

## 2021-09-29 DIAGNOSIS — I5021 Acute systolic (congestive) heart failure: Secondary | ICD-10-CM | POA: Diagnosis not present

## 2021-09-29 DIAGNOSIS — N189 Chronic kidney disease, unspecified: Secondary | ICD-10-CM | POA: Diagnosis not present

## 2021-09-29 DIAGNOSIS — Z91148 Patient's other noncompliance with medication regimen for other reason: Secondary | ICD-10-CM | POA: Diagnosis not present

## 2021-09-29 DIAGNOSIS — I4891 Unspecified atrial fibrillation: Secondary | ICD-10-CM

## 2021-09-29 LAB — COMPREHENSIVE METABOLIC PANEL
ALT: 15 U/L (ref 0–44)
AST: 21 U/L (ref 15–41)
Albumin: 3.1 g/dL — ABNORMAL LOW (ref 3.5–5.0)
Alkaline Phosphatase: 100 U/L (ref 38–126)
Anion gap: 12 (ref 5–15)
BUN: 44 mg/dL — ABNORMAL HIGH (ref 6–20)
CO2: 22 mmol/L (ref 22–32)
Calcium: 8.7 mg/dL — ABNORMAL LOW (ref 8.9–10.3)
Chloride: 104 mmol/L (ref 98–111)
Creatinine, Ser: 3.4 mg/dL — ABNORMAL HIGH (ref 0.61–1.24)
GFR, Estimated: 20 mL/min — ABNORMAL LOW (ref >60)
Glucose, Bld: 100 mg/dL — ABNORMAL HIGH (ref 70–99)
Potassium: 3.6 mmol/L (ref 3.5–5.1)
Sodium: 138 mmol/L (ref 135–145)
Total Bilirubin: 4 mg/dL — ABNORMAL HIGH (ref 0.3–1.2)
Total Protein: 6.3 g/dL — ABNORMAL LOW (ref 6.5–8.1)

## 2021-09-29 LAB — ECHOCARDIOGRAM COMPLETE
Calc EF: 30.9 %
Height: 71 in
MV M vel: 4.62 m/s
MV Peak grad: 85.4 mmHg
Radius: 0.4 cm
S' Lateral: 5.36 cm
Single Plane A2C EF: 35.2 %
Single Plane A4C EF: 27.3 %
Weight: 4126.4 oz

## 2021-09-29 LAB — CBC
HCT: 42.5 % (ref 39.0–52.0)
Hemoglobin: 13.6 g/dL (ref 13.0–17.0)
MCH: 25.5 pg — ABNORMAL LOW (ref 26.0–34.0)
MCHC: 32 g/dL (ref 30.0–36.0)
MCV: 79.7 fL — ABNORMAL LOW (ref 80.0–100.0)
Platelets: 246 10*3/uL (ref 150–400)
RBC: 5.33 MIL/uL (ref 4.22–5.81)
RDW: 18.4 % — ABNORMAL HIGH (ref 11.5–15.5)
WBC: 7.9 10*3/uL (ref 4.0–10.5)
nRBC: 0 % (ref 0.0–0.2)

## 2021-09-29 LAB — MAGNESIUM: Magnesium: 2 mg/dL (ref 1.7–2.4)

## 2021-09-29 MED ORDER — PERFLUTREN LIPID MICROSPHERE
1.0000 mL | INTRAVENOUS | Status: AC | PRN
  Administered 2021-09-29: 2.5 mL via INTRAVENOUS

## 2021-09-29 MED ORDER — MUSCLE RUB 10-15 % EX CREA
1.0000 | TOPICAL_CREAM | CUTANEOUS | Status: DC | PRN
  Filled 2021-09-29: qty 85

## 2021-09-29 NOTE — Plan of Care (Signed)
  Problem: Activity: Goal: Capacity to carry out activities will improve Outcome: Progressing   Problem: Cardiac: Goal: Ability to achieve and maintain adequate cardiopulmonary perfusion will improve Outcome: Progressing   Problem: Activity: Goal: Ability to tolerate increased activity will improve Outcome: Progressing   Problem: Cardiac: Goal: Ability to achieve and maintain adequate cardiopulmonary perfusion will improve Outcome: Progressing   Problem: Health Behavior/Discharge Planning: Goal: Ability to safely manage health-related needs after discharge will improve Outcome: Progressing   Problem: Clinical Measurements: Goal: Ability to maintain clinical measurements within normal limits will improve Outcome: Progressing Goal: Will remain free from infection Outcome: Progressing Goal: Diagnostic test results will improve Outcome: Progressing Goal: Respiratory complications will improve Outcome: Progressing Goal: Cardiovascular complication will be avoided Outcome: Progressing   Problem: Activity: Goal: Risk for activity intolerance will decrease Outcome: Progressing   Problem: Nutrition: Goal: Adequate nutrition will be maintained Outcome: Progressing   Problem: Coping: Goal: Level of anxiety will decrease Outcome: Progressing   Problem: Elimination: Goal: Will not experience complications related to bowel motility Outcome: Progressing Goal: Will not experience complications related to urinary retention Outcome: Progressing   Problem: Pain Managment: Goal: General experience of comfort will improve Outcome: Progressing   Problem: Safety: Goal: Ability to remain free from injury will improve Outcome: Progressing   Problem: Education: Goal: Ability to demonstrate management of disease process will improve Outcome: Not Progressing Goal: Ability to verbalize understanding of medication therapies will improve Outcome: Not Progressing   Problem:  Education: Goal: Knowledge of disease or condition will improve Outcome: Not Progressing Goal: Understanding of medication regimen will improve Outcome: Not Progressing   Problem: Education: Goal: Knowledge of General Education information will improve Description: Including pain rating scale, medication(s)/side effects and non-pharmacologic comfort measures Outcome: Not Progressing   Problem: Health Behavior/Discharge Planning: Goal: Ability to manage health-related needs will improve Outcome: Not Progressing

## 2021-09-29 NOTE — Consult Note (Signed)
   Heart Failure Nurse Navigator Note  HFrEF less than 20%.  Left ventricular internal cavity moderately dilated.  Right ventricular systolic function mildly reduced.  Left atria is moderately dilated.  Mild right atrial enlargement.  Moderate mitral valve regurgitation.  From echocardiogram performed on September 2022 left ventricular ejection fraction was 30 to 35%.  He presented to the emergency room with complaints of increased abdominal swelling.  BNP 3800.  Comorbidities:  Previous alcohol abuse Chronic kidney disease stage III Hyperlipidemia Paroxysmal atrial fibrillation  Medications:  Apixaban 5 mg 2 times a day Atorvastatin 80 mg daily Coreg 6.25 mg 2 times a day with meals Farxiga 10 mg daily Furosemide 80 mg 2 times a day Hydralazine 25 mg 3 times a day  Labs:  Sodium 138, potassium 3.6, chloride 104, CO2 22, BUN 44, creatinine 3.4, estimated GFR 20 Weight is 117 kg Blood pressure 127/100   Initial meeting with patient, he was sitting on the side of the bed on room air in no acute distress.  Patient states that he lives at home by himself as his mother is in the care facility.  States that he eats most of his meals out.  He states that he does not like fast food but likes to go to restaurants like [employer].  Attempted to discuss making wise choices when eating at restaurant but patient states he is not interested, he is not making eye contact throughout the interview.  Also attempted to elicit how he takes care of himself at home such as daily weights.  States that he has a scale that it weighs in kilogram he cannot get it switched  to pounds.  Attempted to help him with looking for tab on the underside of the scale switch it  weighing pounds but becomes frustrated.  He had a follow-up appointment in the outpatient heart failure clinic on August 1 but was a no-show.  Now on this admission given an appointment for follow-up on August 31 at 10 AM.  He has a 10%  no-show ratio.  We will continue to follow along.  Pricilla Riffle RN [facility]

## 2021-09-29 NOTE — Progress Notes (Signed)
PROGRESS NOTE    Hector [name]  MWU:[id number] DOB: [birth year] DOA: 09/28/2021 PCP: [name], MD   Brief Narrative:  This 56 years old male with PMH significant for alcohol use disorder(sober x 11 years), HFr EF(last LVEF less than 20%) nonischemic cardiomyopathy, CKD stage IIIb, hypertension, hyperlipidemia, A-fib on Eliquis, history of stroke, hypothyroidism, gout presented to the ED with generalized body swelling.  He has seen heart failure clinic and was switched to torsemide.  However he did not feel that it was helping.  He was admitted in July and was stopped off all diuretics at discharge due to CKD worsening.  He was supposed to follow-up in cardiology but missed that appointment.  Patient has not been taking his medication for last 1 and half week due to not eating much.  In the ED he was found to have creatinine 3.25 which is improved from previous discharge in July, BNP 3800 up from prior.  Chest x-ray showed mixed pulmonary edema. Patient is admitted for decompensated acute on chronic combined systolic and diastolic CHF.   Assessment & Plan:   Active Problems:   Nonischemic cardiomyopathy: EF 20-25%   Hyperlipidemia   Chronic kidney disease (CKD), stage III (moderate) (HCC)   Paroxysmal atrial fibrillation (HCC)   Essential hypertension   Acute on chronic HFrEF (heart failure with reduced ejection fraction) (HCC)   Chronic systolic CHF (congestive heart failure) (HCC)   Iron deficiency anemia   Hypothyroidism  Acute on chronic combined systolic and diastolic heart failure: Nonischemic cardiomyopathy: Patient presented with increased weight, generalized body swelling. BNP elevated, continue IV Lasix 80 mg IV twice daily Monitor daily weight, intake output charting.  Monitor renal functions. Continue carvedilol, Farxiga and hydralazine. If blood pressure improves will add Imdur to hydralazine. Patient not on ACE inhibitor's or ARB's due to renal  dysfunction. Cardiology is consulted , awaiting recommendation.  Obtain 2D echocardiogram. Continue supplemental oxygen as needed. Patient was evaluated by EP and felt not to be candidate for AICD given medication nonadherence.  CKD stage IV: Renal functions at baseline.   In fact improved from prior discharge. Continue to monitor serum creatinine.  Hyperlipidemia: Continue Lipitor  Paroxysmal atrial fibrillation: Heart rate is well-controlled.  Continue Coreg.   Continue Eliquis.  Essential hypertension: Continue hydralazine, Coreg  Iron deficiency anemia: Continue iron supplementation  Hypothyroidism: Continue levothyroxine  Medication noncompliance: Counseled and medication compliant encouraged.    DVT prophylaxis: Eliquis Code Status: (Full code) Family Communication: No family at bed Disposition Plan:   Status is: Inpatient Admitted for acute on chronic combined and systolic and diastolic CHF on IV Lasix, cardiology is consulted.   Consultants:  Cardiology  Procedures: Echocardiogram Antimicrobials: None  Subjective: Patient was seen and examined at bedside.  Overnight events noted.  Patient reports feeling better. He felt fluid overloaded.  Sitting comfortably on the chair , watching television.  Objective: Vitals:   09/29/21 0500 09/29/21 0530 09/29/21 0907 09/29/21 1209  BP:  (!) 120/105 (!) 133/101 (!) 127/100  Pulse:  (!) 110 67 (!) 53  Resp:  16 18 16   Temp:  98.3 F (36.8 C) 98.2 F (36.8 C) 97.7 F (36.5 C)  TempSrc:   Oral Oral  SpO2:  98% 99% 98%  Weight: 117 kg     Height:        Intake/Output Summary (Last 24 hours) at 09/29/2021 1415 Last data filed at 09/29/2021 0905 Gross per 24 hour  Intake --  Output 2550 ml  Net -  2550 ml   Filed Weights   09/28/21 1349 09/28/21 1839 09/29/21 0500  Weight: 121 kg 120.1 kg 117 kg    Examination:  General exam: Appears comfortable, not in any acute distress.  Deconditioned Respiratory  system: Bibasilar crackles, no wheezing, normal respiratory effort. Cardiovascular system: S1 & S2 heard, irregular rhythm, no murmur Gastrointestinal system: Abdomen is soft, distended, nontender, bowel sounds + Central nervous system: Alert and oriented x3 . No focal neurological deficits. Extremities: Edema+, no cyanosis, no clubbing Skin: No rashes, lesions or ulcers Psychiatry: Judgement and insight appear normal. Mood & affect appropriate.     Data Reviewed: I have personally reviewed following labs and imaging studies  CBC: Recent Labs  Lab 09/28/21 1403 09/29/21 0351  WBC 7.3 7.9  NEUTROABS 4.7  --   HGB 13.0 13.6  HCT 42.3 42.5  MCV 81.0 79.7*  PLT 294 401   Basic Metabolic Panel: Recent Labs  Lab 09/28/21 1403 09/29/21 0351  NA 138 138  K 4.1 3.6  CL 105 104  CO2 22 22  GLUCOSE 105* 100*  BUN 39* 44*  CREATININE 3.25* 3.40*  CALCIUM 8.6* 8.7*  MG 2.1 2.0   GFR: Estimated Creatinine Clearance: 31.2 mL/min (A) (by C-G formula based on SCr of 3.4 mg/dL (H)). Liver Function Tests: Recent Labs  Lab 09/28/21 1403 09/29/21 0351  AST 24 21  ALT 17 15  ALKPHOS 92 100  BILITOT 3.8* 4.0*  PROT 6.4* 6.3*  ALBUMIN 3.3* 3.1*   Recent Labs  Lab 09/28/21 1403  LIPASE 26   No results for input(s): "AMMONIA" in the last 168 hours. Coagulation Profile: No results for input(s): "INR", "PROTIME" in the last 168 hours. Cardiac Enzymes: No results for input(s): "CKTOTAL", "CKMB", "CKMBINDEX", "TROPONINI" in the last 168 hours. BNP (last 3 results) No results for input(s): "PROBNP" in the last 8760 hours. HbA1C: No results for input(s): "HGBA1C" in the last 72 hours. CBG: No results for input(s): "GLUCAP" in the last 168 hours. Lipid Profile: No results for input(s): "CHOL", "HDL", "LDLCALC", "TRIG", "CHOLHDL", "LDLDIRECT" in the last 72 hours. Thyroid Function Tests: Recent Labs    09/28/21 1403  TSH 4.547*   Anemia Panel: No results for input(s):  "VITAMINB12", "FOLATE", "FERRITIN", "TIBC", "IRON", "RETICCTPCT" in the last 72 hours. Sepsis Labs: No results for input(s): "PROCALCITON", "LATICACIDVEN" in the last 168 hours.  No results found for this or any previous visit (from the past 240 hour(s)).   Radiology Studies: ECHOCARDIOGRAM COMPLETE  Result Date: 09/29/2021    ECHOCARDIOGRAM REPORT   Patient Name:   KHADIR ROAM Date of Exam: 09/29/2021 Medical Rec #:  [id number]           Height:       71.0 in Accession #:    [id number]          Weight:       257.9 lb Date of Birth:  November 01, 1965           BSA:          2.349 m Patient Age:    24 years            BP:           120/105 mmHg Patient Gender: M                   HR:           87 bpm. Exam Location:  [facility] Procedure: 2D Echo, Cardiac Doppler, Color Doppler  and Intracardiac            Opacification Agent Indications:     CHF-Acute Systolic V77.93  History:         Patient has prior history of Echocardiogram examinations, most                  recent 03/05/2021. Cardiomyopathy and CHF, Stroke,                  Arrythmias:Atrial Fibrillation and Atrial Flutter; Risk                  Factors:Hypertension, Dyslipidemia and ETOH.  Sonographer:     Bernadene Person RDCS Referring Phys:  [place] Diagnosing Phys: Kathlyn Sacramento MD IMPRESSIONS  1. Left ventricular ejection fraction, by estimation, is <20%. The left ventricle has severely decreased function. The left ventricle has no regional wall motion abnormalities. The left ventricular internal cavity size was moderately dilated. Left ventricular diastolic parameters are indeterminate.  2. Right ventricular systolic function is mildly reduced. The right ventricular size is normal. Tricuspid regurgitation signal is inadequate for assessing PA pressure.  3. Left atrial size was moderately dilated.  4. Right atrial size was mildly dilated.  5. The mitral valve is normal in structure. Moderate mitral valve regurgitation. No evidence of mitral  stenosis.  6. The aortic valve is normal in structure. Aortic valve regurgitation is not visualized. Aortic valve sclerosis/calcification is present, without any evidence of aortic stenosis.  7. The inferior vena cava is dilated in size with <50% respiratory variability, suggesting right atrial pressure of 15 mmHg. FINDINGS  Left Ventricle: Left ventricular ejection fraction, by estimation, is <20%. The left ventricle has severely decreased function. The left ventricle has no regional wall motion abnormalities. Definity contrast agent was given IV to delineate the left ventricular endocardial borders. The left ventricular internal cavity size was moderately dilated. There is no left ventricular hypertrophy. Left ventricular diastolic parameters are indeterminate. Right Ventricle: The right ventricular size is normal. No increase in right ventricular wall thickness. Right ventricular systolic function is mildly reduced. Tricuspid regurgitation signal is inadequate for assessing PA pressure. The tricuspid regurgitant velocity is 2.14 m/s, and with an assumed right atrial pressure of 15 mmHg, the estimated right ventricular systolic pressure is 90.3 mmHg. Left Atrium: Left atrial size was moderately dilated. Right Atrium: Right atrial size was mildly dilated. Pericardium: There is no evidence of pericardial effusion. Mitral Valve: The mitral valve is normal in structure. Moderate mitral valve regurgitation. No evidence of mitral valve stenosis. Tricuspid Valve: The tricuspid valve is normal in structure. Tricuspid valve regurgitation is trivial. No evidence of tricuspid stenosis. Aortic Valve: The aortic valve is normal in structure. Aortic valve regurgitation is not visualized. Aortic valve sclerosis/calcification is present, without any evidence of aortic stenosis. Pulmonic Valve: The pulmonic valve was normal in structure. Pulmonic valve regurgitation is not visualized. No evidence of pulmonic stenosis. Aorta: The  aortic root is normal in size and structure. Venous: The inferior vena cava is dilated in size with less than 50% respiratory variability, suggesting right atrial pressure of 15 mmHg. IAS/Shunts: No atrial level shunt detected by color flow Doppler.  LEFT VENTRICLE PLAX 2D LVIDd:         5.84 cm LVIDs:         5.36 cm LV PW:         1.25 cm LV IVS:        1.17 cm LVOT diam:  2.20 cm LVOT Area:     3.80 cm  LV Volumes (MOD) LV vol d, MOD A2C: 128.0 ml LV vol d, MOD A4C: 131.0 ml LV vol s, MOD A2C: 83.0 ml LV vol s, MOD A4C: 95.3 ml LV SV MOD A2C:     45.0 ml LV SV MOD A4C:     131.0 ml LV SV MOD BP:      41.5 ml RIGHT VENTRICLE TAPSE (M-mode): 1.5 cm LEFT ATRIUM             Index LA diam:        4.60 cm 1.96 cm/m LA Vol (A2C):   95.9 ml 40.82 ml/m LA Vol (A4C):   94.6 ml 40.27 ml/m LA Biplane Vol: 95.1 ml 40.48 ml/m   AORTA Ao Root diam: 3.30 cm Ao Asc diam:  3.50 cm MR Peak grad:    85.4 mmHg    TRICUSPID VALVE MR Mean grad:    55.0 mmHg    TR Peak grad:   18.3 mmHg MR Vmax:         462.00 cm/s  TR Vmax:        214.00 cm/s MR Vmean:        355.0 cm/s MR PISA:         1.01 cm     SHUNTS MR PISA Eff ROA: 8 mm        Systemic Diam: 2.20 cm MR PISA Radius:  0.40 cm Kathlyn Sacramento MD Electronically signed by Kathlyn Sacramento MD Signature Date/Time: 09/29/2021/1:06:02 PM    Final    DG Chest 2 View  Result Date: 09/28/2021 CLINICAL DATA:  Shortness of breath. EXAM: CHEST - 2 VIEW COMPARISON:  Jul 22, 2021 FINDINGS: Enlarged cardiac silhouette. Mixed pattern pulmonary edema. There is no evidence of focal airspace consolidation, pleural effusion or pneumothorax. Osseous structures are without acute abnormality. Soft tissues are grossly normal. IMPRESSION: Enlarged cardiac silhouette with mixed pattern pulmonary edema. Electronically Signed   By: Fidela Salisbury M.D.   On: 09/28/2021 15:52     Scheduled Meds:  allopurinol  50 mg Oral Daily   apixaban  5 mg Oral BID   atorvastatin  80 mg Oral Daily    carvedilol  6.25 mg Oral BID WC   dapagliflozin propanediol  10 mg Oral QAC breakfast   ferrous gluconate  324 mg Oral Q breakfast   furosemide  80 mg Intravenous BID   hydrALAZINE  25 mg Oral TID   sodium chloride flush  3 mL Intravenous Q12H   sodium chloride flush  3 mL Intravenous Q12H   Continuous Infusions:  sodium chloride       LOS: 1 day    Time spent: 50 mins    Leler Brion, MD [facility]   If 7PM-7AM, please contact night-coverage

## 2021-09-29 NOTE — Progress Notes (Signed)
  Echocardiogram 2D Echocardiogram has been performed.  Fidel Levy 09/29/2021, 11:46 AM

## 2021-09-29 NOTE — Consult Note (Signed)
Cardiology Consultation:   [name]; [id number]; 11-06-1965   Admit date: 09/28/2021 Date of Consult: 09/29/2021  Primary Care Provider: [name], MD Primary Cardiologist: Hector [name] Primary Electrophysiologist:  None   Patient Profile:   Hector [name] is a 56 y.o. 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  History of Present Illness:   Hector [name] has a history of heart failure that dates back at least to 2009, and was felt to be presumptively nonischemic in the setting of alcohol use, with an EF of 20%.  Was admitted to the hospital in 02/2014 with volume overload.  Echo demonstrated an EF of 20 to 25%.  We first met him in 03/2014 for hospital follow-up.  It was noted he had undergone several ischemic evaluations in the past through outside office.  LHC in 12/2014 showed minimal CAD, which confirmed a presumptive diagnosis of NICM.  He was referred to EP with plans to pursue ICD.  However, this was canceled given improvement in his LV systolic function after restoring sinus rhythm by echo in 01/2015.  Since then, he has had fluctuations in his EF as outlined below.  With regards to his Afibflutter, he has previously been managed with amiodarone, though this was discontinued secondary to abnormal thyroid function. He underwent TEE/DCCV in 02/2021.  Echo at that time showed an EF of less than 20% with severely reduced RV systolic function.  He was reevaluated by EP in 04/2021 and felt to be not a good candidate for ICD or ablation given medication nonadherence.  Throughout the years, he has had intermittent medication compliance.  He was last seen in our office in 04/2021 and  reported he "took his medications today."  He was maintaining sinus rhythm at that time.  Since that visit, he was evaluated in the ED in 06/2021 with abdominal pain and distention.  He was treated with IV Lasix with improvement in symptoms and discharged to outpatient follow-up.  More recently, he was admitted to the hospital in 08/2021 with continued abdominal discomfort and distention.  He underwent extensive GI work-up including MRCP and HIDA scan with conservative therapy recommended.  Please see discharge summary for details.  EKG was not performed during admission.  Due to worsening renal function, it was recommended he hold torsemide at discharge.  From a cardiac perspective, he was continued on apixaban 5 mg twice daily, atorvastatin 80 mg, carvedilol 6.25 mg twice daily, Farxiga 10 mg, hydralazine 25 mg 3 times daily.  Following his hospital discharge, he has been nonadherent with his medications, indicating he last took them at least 2 weeks prior.  He has continued to note ongoing abdominal pain and swelling with early satiety.  No significant lower extremity swelling or orthopnea.  No chest pain or significant dyspnea.  He is uncertain about weight gain.  He was admitted to the hospital on 09/28/2021 with acute on chronic combined systolic and diastolic CHF in the context of medication nonadherence.  He is noted to be back in A-fib with RVR, though it is uncertain when he developed this rhythm.  Last documented EKG from 07/22/2021 shows a sinus rhythm with PVCs.  BNP 3800 with a baseline around 1500-2000.,  BUN/SCr 39/3.25  trending to 44/3.4 which appears to be consistent with prior readings.  TSH 4.547.  EKG not performed.  Chest x-ray showed enlarged cardiac silhouette with pulmonary edema.  In the ED, IV Lasix 80 mg, which was continued at twice daily dosing upon admission.  Documented urine output 2.5 L for the admission.  Upon cardiology consultation, he reports his abdominal swelling is somewhat  improved, though not back to baseline.  He indicates he has an appetite for the first time in several weeks.  He remains in A-fib with ventricular rates in the 80s to low 100s predominantly with occasional readings into the 120s bpm.   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 12/2007 MV: EF 28%, no isch;  b. 8/12 Echo: EF 25-35%; c. 02/2014 Echo: EF 20-25%; d. 12/2014 Cath: minimal CAD; e. 01/2015 Echo: EF 50-55%;  d. 05/2016 Echo: EF 30-35%, diff HK, gr2 DD; e. 11/2016 Echo: EF 45-50%, diff HK.   Chronic combined systolic (congestive) and diastolic (congestive) heart failure ([place])    a. 05/2016 Most recent Echo: EF 30-35%, diff HK, Gr2 DD, mild MR, mod dil LA; b. 11/2016 Echo: EF 45-50%, diff HK.   CKD (chronic kidney disease), stage III (HCC)    Elevated troponin    Chronically elevated. - level was 0.17-0.10 during recent admission   Essential hypertension    GI bleed 11/2013   Hyperlipidemia    Pancreatitis    Paroxysmal A-fib (HCC)    a. new onset s/p unsuccessful TEE/DCCV on 08/16/2014; b. on amio/eliquis (CHA2DS2VASc = 2-3); c. reports 1-2 hrs of afib ~ q67mo.   Parox[place]mal atrial flutter [place]    a. new onset 07/2014; b. s/p unsuccessful TEE/DCCV 08/16/2014; c. on apixaban.   Sleep-disordered breathing    Has yet to have a sleep study   Stroke ([facility]     Past Surgical History:  Procedure Laterality Date   CARDIAC CATHETERIZATION N/A 01/09/2015   Procedure: Left Heart Cath and Coronary Angiography;  Surgeon: Hector Man MD;  [facility];  Service: Cardiovascular;  Laterality: N/A;   CARDIOVERSION N/A 03/05/2021   Procedure: CARDIOVERSION;  Surgeon: Hector Sable MD;  Location: [facility];  Service: Cardiovascular;  Laterality: N/A;   COLONOSCOPY N/A 07/22/2020   Procedure: COLONOSCOPY;  Surgeon: Hector [name], Hector Pike MD;  Location: [facility];  Service: Gastroenterology;  Laterality: N/A;    ELECTROPHYSIOLOGIC STUDY N/A 08/16/2014   Procedure: CARDIOVERSION;  Surgeon: Hector Merritts MD;  Location: [facility];  Service: Cardiovascular;  Laterality: N/A;   FLEXIBLE SIGMOIDOSCOPY N/A 10/10/2015   Procedure: FLEXIBLE SIGMOIDOSCOPY;  Surgeon: MLollie Sails MD;  [facility];  Service: Endoscopy;  Laterality: N/A;   KNEE SURGERY Right    NM MYOVIEW LTD  November 2011   No ischemia or infarction. EF 50-55% ( no improvement from 2009 Myoview EF of 28%   TEE WITHOUT CARDIOVERSION N/A 08/16/2014   Procedure: TRANSESOPHAGEAL ECHOCARDIOGRAM (TEE);  Surgeon: Hector Merritts MD;  Location: [facility];  Service: Cardiovascular;  Laterality: N/A;   TEE WITHOUT CARDIOVERSION N/A 03/05/2021   Procedure: TRANSESOPHAGEAL ECHOCARDIOGRAM (TEE);  Surgeon: Hector Sable MD;  Location: [facility];  Service: Cardiovascular;  Laterality: N/A;     Home Meds: Prior to Admission medications   Medication Sig Start Date End Date Taking? Authorizing Provider  allopurinol (ZYLOPRIM) 100 MG tablet Take 1 tablet by mouth daily. Taking a 1/2 tablet 02/14/21 02/14/22  [provider]  apixaban (ELIQUIS) 5 MG TABS tablet Take 1 tablet (5 mg total) by mouth 2 (two) times daily. 05/13/21   [name], MD  atorvastatin (LIPITOR) 80 MG tablet Take 1 tablet (80 mg total) by mouth daily. 05/13/21   [name], MD  carvedilol (COREG) 6.25 MG tablet Take 1 tablet (6.25 mg total) by mouth 2 (two) times daily with a meal. 09/14/21 10/14/21  Nolberto Hanlon, MD  dapagliflozin propanediol (FARXIGA) 10 MG TABS tablet Take 1 tablet (10 mg total) by mouth daily before breakfast. 05/13/21   Gollan, Kathlene November, MD  ferrous gluconate (FERGON) 324 MG tablet Take 324 mg by mouth daily with breakfast.    [provider]  hydrALAZINE (APRESOLINE) 25 MG tablet Take 1 tablet (25 mg total) by mouth 3 (three) times daily. 05/13/21   [name], MD  levothyroxine (SYNTHROID) 100 MCG tablet Take 100  mcg by mouth daily before breakfast.    [provider]  metoprolol succinate (TOPROL-XL) 25 MG 24 hr tablet Take 25 mg by mouth daily.    [provider]  torsemide (DEMADEX) 20 MG tablet Take 20 mg by mouth daily.    [provider]    Inpatient Medications: Scheduled Meds:  allopurinol  50 mg Oral Daily   apixaban  5 mg Oral BID   atorvastatin  80 mg Oral Daily   carvedilol  6.25 mg Oral BID WC   dapagliflozin propanediol  10 mg Oral QAC breakfast   ferrous gluconate  324 mg Oral Q breakfast   furosemide  80 mg Intravenous BID   hydrALAZINE  25 mg Oral TID   sodium chloride flush  3 mL Intravenous Q12H   sodium chloride flush  3 mL Intravenous Q12H   Continuous Infusions:  sodium chloride     PRN Meds: sodium chloride, acetaminophen **OR** acetaminophen, hydrALAZINE, Muscle Rub, ondansetron **OR** ondansetron (ZOFRAN) IV, oxyCODONE, senna-docusate, sodium chloride flush  Allergies:   Allergies  Allergen Reactions   Esomeprazole Magnesium Other (See Comments)    Suspected interstitial nephritis 2018   Eggs Or Egg-Derived Products Rash    Social History:   Social History   Socioeconomic History   Marital status: Single    Spouse name: Not on file   Number of children: Not on file   Years of education: Not on file   Highest education level: Not on file  Occupational History   Not on file  Tobacco Use   Smoking status: Former    Packs/day: 1.00    Years: 12.00    Total pack years: 12.00    Types: Cigarettes    Quit date: 02/23/1998    Years since quitting: 23.6   Smokeless tobacco: Never  Vaping Use   Vaping Use: Never used  Substance and Sexual Activity   Alcohol use: Not Currently    Alcohol/week: 0.0 standard drinks of alcohol    Comment: Past heavy drinker   Drug use: No   Sexual activity: Not Currently  Other Topics Concern   Not on file  Social History Narrative   Not on file   Social Determinants of Health   Financial  Resource Strain: Low Risk  (01/04/2019)   Overall Financial Resource Strain (CARDIA)    Difficulty of Paying Living Expenses: Not hard at all  Food Insecurity: No Food Insecurity (01/04/2019)   Hunger Vital Sign    Worried About Running Out of Food in the Last Year: Never true  Ran Out of Food in the Last Year: Never true  Transportation Needs: No Transportation Needs (01/04/2019)   PRAPARE - Hydrologist (Medical): No    Lack of Transportation (Non-Medical): No  Physical Activity: Inactive (01/04/2019)   Exercise Vital Sign    Days of Exercise per Week: 0 days    Minutes of Exercise per Session: 0 min  Stress: No Stress Concern Present (01/04/2019)   [place]    Feeling of Stress : Not at all  Social Connections: Moderately Isolated (01/04/2019)   Social Connection and Isolation Panel [NHANES]    Frequency of Communication with Friends and Family: More than three times a week    Frequency of Social Gatherings with Friends and Family: More than three times a week    Attends Religious Services: 1 to 4 times per year    Active Member of [employer] or Organizations: No    Attends Archivist Meetings: Never    Marital Status: Never married  Intimate Partner Violence: Not At Risk (01/04/2019)   Humiliation, Afraid, Rape, and Kick questionnaire    Fear of Current or Ex-Partner: No    Emotionally Abused: No    Physically Abused: No    Sexually Abused: No     Family History:   Family History  Problem Relation Age of Onset   Hypertension Mother    Hyperlipidemia Mother    Diabetes Mother     ROS:  Review of Systems  Constitutional:  Positive for malaise/fatigue. Negative for chills, diaphoresis, fever and weight loss.  HENT:  Negative for congestion.   Eyes:  Negative for discharge and redness.  Respiratory:  Positive for shortness of breath. Negative for cough, sputum  production and wheezing.   Cardiovascular:  Positive for leg swelling. Negative for chest pain, palpitations, orthopnea, claudication and PND.  Gastrointestinal:  Positive for abdominal pain. Negative for heartburn, nausea and vomiting.       Abdominal swelling Early satiety   Musculoskeletal:  Negative for falls and myalgias.  Skin:  Negative for rash.  Neurological:  Negative for dizziness, tingling, tremors, sensory change, speech change, focal weakness, loss of consciousness and weakness.  Endo/Heme/Allergies:  Does not bruise/bleed easily.  Psychiatric/Behavioral:  Negative for substance abuse. The patient is not nervous/anxious.   All other systems reviewed and are negative.     Physical Exam/Data:   Vitals:   09/29/21 0028 09/29/21 0500 09/29/21 0530 09/29/21 0907  BP: (!) 141/129  (!) 120/105 (!) 133/101  Pulse: 67  (!) 110 67  Resp: '18  16 18  ' Temp: 98 F (36.7 C)  98.3 F (36.8 C) 98.2 F (36.8 C)  TempSrc:    Oral  SpO2: 97%  98% 99%  Weight:  117 kg    Height:        Intake/Output Summary (Last 24 hours) at 09/29/2021 0933 Last data filed at 09/29/2021 0905 Gross per 24 hour  Intake --  Output 2550 ml  Net -2550 ml   Filed Weights   09/28/21 1349 09/28/21 1839 09/29/21 0500  Weight: 121 kg 120.1 kg 117 kg   Body mass index is 35.97 kg/m.   Physical Exam: General: Well developed, well nourished, in no acute distress. Head: Normocephalic, atraumatic, sclera non-icteric, no xanthomas, nares without discharge.  Neck: Negative for carotid bruits. JVD elevated to the angle of the mandible. Lungs: Diminished breath sounds bilaterally. Breathing is unlabored. Heart: IRIR with S1  S2. No murmurs, rubs, or gallops appreciated. Abdomen: Soft, non-tender, distended with normoactive bowel sounds. No hepatomegaly. No rebound/guarding. No obvious abdominal masses. Msk:  Strength and tone appear normal for age. Extremities: No clubbing or cyanosis.  Trivial bilateral  pretibial edema. Distal pedal pulses are 2+ and equal bilaterally. Neuro: Alert and oriented X 3. No facial asymmetry. No focal deficit. Moves all extremities spontaneously. Psych:  Responds to questions appropriately with a normal affect.   EKG:  The EKG was personally reviewed and demonstrates: Not available for review Telemetry:  Telemetry was personally reviewed and demonstrates: Afib with ventricular rates in the low 100s to 120s bpm occasionally   Weights: Filed Weights   09/28/21 1349 09/28/21 1839 09/29/21 0500  Weight: 121 kg 120.1 kg 117 kg    Relevant CV Studies:  TEE 03/05/2021: 1. Left ventricular ejection fraction, by estimation, is <20%. The left  ventricle has severely decreased function. The left ventricular internal  cavity size was mildly dilated.   2. Right ventricular systolic function is severely reduced. The right  ventricular size is normal.   3. Left atrial size was mildly dilated. No left atrial/left atrial  appendage thrombus was detected.   4. The mitral valve is normal in structure. Mild to moderate mitral valve  regurgitation.   5. The aortic valve is tricuspid. Aortic valve regurgitation is not  visualized.   6. Agitated saline contrast bubble study was negative, with no evidence  of any interatrial shunt. __________  2D echo 10/29/2020: 1. Left ventricular ejection fraction, by estimation, is 30 to 35%. The  left ventricle has moderately decreased function. The left ventricle  demonstrates global hypokinesis. The left ventricular internal cavity size  was borderline dilated. There is mild   left ventricular hypertrophy.   2. Right ventricular systolic function is mildly reduced. The right  ventricular size is normal.   3. Mild to moderate mitral valve regurgitation. __________  2D echo 09/13/2020: 1. Left ventricular ejection fraction, by estimation, is 25 to 30%. The  left ventricle has severely decreased function. The left ventricle   demonstrates global hypokinesis. The left ventricular internal cavity size  was mildly dilated. Left ventricular  diastolic parameters are indeterminate. The average left ventricular  global longitudinal strain is -6.7 %. The global longitudinal strain is  abnormal.   2. Right ventricular systolic function is moderately reduced. The right  ventricular size is normal. Tricuspid regurgitation signal is inadequate  for assessing PA pressure.   3. The mitral valve is normal in structure. Moderate mitral valve  regurgitation.   4. The inferior vena cava is dilated in size with <50% respiratory  variability, suggesting right atrial pressure of 15 mmHg.  __________  2D echo 01/31/2020: 1. Left ventricular ejection fraction, by estimation, is 45 to 50%. The  left ventricle has normal function. The left ventricle demonstrates  regional wall motion abnormalities. Abnormal (paradoxical) septal motion,  consistent with left bundle branch  block. Left ventricular diastolic parameters are indeterminate.   2. Right ventricular systolic function is normal. The right ventricular  size is normal. Tricuspid regurgitation signal is inadequate for assessing  PA pressure.   3. The mitral valve is normal in structure. No evidence of mitral valve  regurgitation.   4. The aortic valve was not well visualized. Aortic valve regurgitation  is not visualized. No aortic stenosis is present.   5. The inferior vena cava is normal in size with greater than 50%  respiratory variability, suggesting right atrial pressure of  3 mmHg. __________  Limited echo 12/03/2016: - Procedure narrative: This was a LIMITED TTE to assess LVEF.  - Left ventricle: The cavity size was mildly dilated. There was    mild concentric hypertrophy. Systolic function was mildly    reduced. The estimated ejection fraction was in the range of 45%    to 50%. Diffuse hypokinesis. __________  2D echo 06/04/2016: - Left ventricle: The cavity  size was mildly dilated. There was    mild concentric hypertrophy. Systolic function was moderately to    severely reduced. The estimated ejection fraction was in the    range of 30% to 35%. Diffuse hypokinesis. Features are consistent    with a pseudonormal left ventricular filling pattern, with    concomitant abnormal relaxation and increased filling pressure    (grade 2 diastolic dysfunction).  - Mitral valve: There was mild regurgitation.  - Left atrium: The atrium was moderately dilated.  - Pulmonary arteries: Systolic pressure could not be accurately    estimated. __________  2D echo 02/21/2015: - Left ventricle: The cavity size was normal. There was mild    concentric hypertrophy. Systolic function was normal. The    estimated ejection fraction was in the range of 50% to 55%.    Unable to exclude inferior-posterior wall hypokinesis. Left    ventricular diastolic function parameters were normal.  - Mitral valve: There was mild regurgitation.  - Left atrium: The atrium was mildly dilated.  - Right ventricle: Systolic function was normal.  - Pulmonary arteries: Systolic pressure was within the normal    range.   Impressions:   - Challenging image quality. Rhythm is normal sinus. __________  LHC 01/09/2015: Angiographically minimal CAD Non-ishcemic Cardiomyopathy Systemic Hypertension with Mild-Moderately increase LVEDP after pre-cath hydration.     This confirms the presumed diagnosis of Non-ischemic Cardiomyopathy.   Plan: Discharge home after Bed-rest & TR band removal Continue current Cardiomyopathy Medications - BB, Entresto & Lasix Will refer back to Dr. Caryl Comes for reconsideration of ICD. __________  TEE/DCCV 08/17/2014: - Left ventricle: Systolic function was severely reduced. The    estimated ejection fraction was in the range of 20% to 25%.    Diffuse hypokinesis. The study is not technically sufficient to    allow evaluation of LV diastolic function. No  evidence of    thrombus.  - Mitral valve: There was mild regurgitation.  - Left atrium: No evidence of thrombus in the atrial cavity or    appendage. No evidence of thrombus in the appendage. The    appendage was moderately to severely dilated. Emptying velocity    was normal.  - Right ventricle: The cavity size was moderately dilated. Systolic    function was moderately to severely reduced.  - Right atrium: The atrium was mildly dilated.  - Atrial septum: No defect or patent foramen ovale was identified.   Impressions:   - Successful cardioversion. No cardiac source of emboli was    indentified in the LA, LA appendage or LV.   Laboratory Data:  Chemistry Recent Labs  Lab 09/28/21 1403 09/29/21 0351  NA 138 138  K 4.1 3.6  CL 105 104  CO2 22 22  GLUCOSE 105* 100*  BUN 39* 44*  CREATININE 3.25* 3.40*  CALCIUM 8.6* 8.7*  GFRNONAA 21* 20*  ANIONGAP 11 12    Recent Labs  Lab 09/28/21 1403 09/29/21 0351  PROT 6.4* 6.3*  ALBUMIN 3.3* 3.1*  AST 24 21  ALT 17 15  ALKPHOS 92 100  BILITOT 3.8* 4.0*   Hematology Recent Labs  Lab 09/28/21 1403 09/29/21 0351  WBC 7.3 7.9  RBC 5.22 5.33  HGB 13.0 13.6  HCT 42.3 42.5  MCV 81.0 79.7*  MCH 24.9* 25.5*  MCHC 30.7 32.0  RDW 18.8* 18.4*  PLT 294 246   Cardiac EnzymesNo results for input(s): "TROPONINI" in the last 168 hours. No results for input(s): "TROPIPOC" in the last 168 hours.  BNP Recent Labs  Lab 09/28/21 1403  BNP 3,806.7*    DDimer No results for input(s): "DDIMER" in the last 168 hours.  Radiology/Studies:  DG Chest 2 View  Result Date: 09/28/2021 IMPRESSION: Enlarged cardiac silhouette with mixed pattern pulmonary edema. Electronically Signed   By: Fidela Salisbury M.D.   On: 09/28/2021 15:52    Assessment and Plan:   1. Acute on chronic combined systolic and diastolic CHF secondary to NICM: -Volume overload likely in the context of prior medication nonadherence and while off diuretic -Agree  with IV Lasix 80 mg twice daily with close monitoring of renal function and electrolytes, stable currently -If he has worsening renal function with IV Lasix, we may need to consider milrinone infusion to augment diuresis, though in the context of medication nonadherence we would prefer to avoid this -PTA carvedilol, Farxiga, hydralazine -As BP allows would look to add Imdur to hydralazine -He has not been maintained on ACE inhibitor/ARB/ARNI/MRA in the context of prior hyperkalemia with underlying significant renal dysfunction -Echo pending, though suspect his cardiomyopathy remains in the context of medication nonadherence and with underlying A-fib -He has been evaluated by EP and felt to not be a candidate for ICD given medication nonadherence, he would also not be a candidate for advanced heart failure modalities without documented medication compliance -Strict I's and O's -Daily weights -CHF education  2. PAF/flutter: -Uncertain onset -PTA carvedilol -Has been nonadherent to PTA apixaban -Difficult to pursue TEE guided DCCV at this time given prior medication nonadherence in the outpatient setting -For now, ventricular rates are reasonably controlled on carvedilol -Eliquis 5 mg twice daily (he does not meet reduced dosing criteria) -Not a candidate for amiodarone given abnormal thyroid function -Not a candidate for ablation per EP given medication nonadherence  3.  CKD stage III-IV -Monitor with diuresis  4.  Medication nonadherence: -This seems to be his biggest driving force in hospital readmissions and overall symptomology -Compliance is encouraged      For questions or updates, please contact [place] Please consult [email or web address] for contact info under Cardiology/STEMI.   Signed, Christell Faith, PA-C [place] Pager: [phone] 09/29/2021, 9:33 AM

## 2021-09-30 DIAGNOSIS — I428 Other cardiomyopathies: Secondary | ICD-10-CM

## 2021-09-30 DIAGNOSIS — I5023 Acute on chronic systolic (congestive) heart failure: Secondary | ICD-10-CM | POA: Diagnosis not present

## 2021-09-30 LAB — CBC
HCT: 46 % (ref 39.0–52.0)
Hemoglobin: 14.4 g/dL (ref 13.0–17.0)
MCH: 25 pg — ABNORMAL LOW (ref 26.0–34.0)
MCHC: 31.3 g/dL (ref 30.0–36.0)
MCV: 80 fL (ref 80.0–100.0)
Platelets: 267 10*3/uL (ref 150–400)
RBC: 5.75 MIL/uL (ref 4.22–5.81)
RDW: 19.1 % — ABNORMAL HIGH (ref 11.5–15.5)
WBC: 8 10*3/uL (ref 4.0–10.5)
nRBC: 0 % (ref 0.0–0.2)

## 2021-09-30 LAB — BASIC METABOLIC PANEL
Anion gap: 10 (ref 5–15)
BUN: 42 mg/dL — ABNORMAL HIGH (ref 6–20)
CO2: 26 mmol/L (ref 22–32)
Calcium: 8.3 mg/dL — ABNORMAL LOW (ref 8.9–10.3)
Chloride: 101 mmol/L (ref 98–111)
Creatinine, Ser: 3.37 mg/dL — ABNORMAL HIGH (ref 0.61–1.24)
GFR, Estimated: 20 mL/min — ABNORMAL LOW (ref >60)
Glucose, Bld: 121 mg/dL — ABNORMAL HIGH (ref 70–99)
Potassium: 3.4 mmol/L — ABNORMAL LOW (ref 3.5–5.1)
Sodium: 137 mmol/L (ref 135–145)

## 2021-09-30 LAB — PHOSPHORUS: Phosphorus: 4.1 mg/dL (ref 2.5–4.6)

## 2021-09-30 LAB — MAGNESIUM: Magnesium: 2.1 mg/dL (ref 1.7–2.4)

## 2021-09-30 MED ORDER — POTASSIUM CHLORIDE 20 MEQ PO PACK
40.0000 meq | PACK | Freq: Once | ORAL | Status: AC
  Administered 2021-09-30: 40 meq via ORAL
  Filled 2021-09-30: qty 2

## 2021-09-30 NOTE — Progress Notes (Signed)
Progress Note  Patient Name: Hector [name] Date of Encounter: 09/30/2021  Primary Cardiologist: Rockey Situ  Subjective   No chest pain or dyspnea. Abdomen less distended. Documented UOP 3.2 L for the past 24 hours, net - 5 L for the admission. Renal function stable. Weight 117 to 114.5 kg.   Inpatient Medications    Scheduled Meds:  allopurinol  50 mg Oral Daily   apixaban  5 mg Oral BID   atorvastatin  80 mg Oral Daily   carvedilol  6.25 mg Oral BID WC   dapagliflozin propanediol  10 mg Oral QAC breakfast   ferrous gluconate  324 mg Oral Q breakfast   furosemide  80 mg Intravenous BID   hydrALAZINE  25 mg Oral TID   potassium chloride  40 mEq Oral Once   sodium chloride flush  3 mL Intravenous Q12H   sodium chloride flush  3 mL Intravenous Q12H   Continuous Infusions:  sodium chloride     PRN Meds: sodium chloride, acetaminophen **OR** acetaminophen, hydrALAZINE, Muscle Rub, ondansetron **OR** ondansetron (ZOFRAN) IV, oxyCODONE, senna-docusate, sodium chloride flush   Vital Signs    Vitals:   09/29/21 2331 09/30/21 0430 09/30/21 0605 09/30/21 0748  BP: 91/68 118/86  (!) 134/111  Pulse: (!) 52 79  98  Resp: 18 18  20   Temp: (!) 97.4 F (36.3 C) (!) 97.4 F (36.3 C)  98.4 F (36.9 C)  TempSrc:    Oral  SpO2: 95% 93%  99%  Weight:   114.5 kg   Height:        Intake/Output Summary (Last 24 hours) at 09/30/2021 0845 Last data filed at 09/30/2021 8676 Gross per 24 hour  Intake --  Output 3600 ml  Net -3600 ml   Filed Weights   09/28/21 1839 09/29/21 0500 09/30/21 1950  Weight: 120.1 kg 117 kg 114.5 kg    Telemetry    Afib with ventricular rates in the 80s to low 100s bpm with rare aberrancy - Personally Reviewed  ECG    No new tracings - Personally Reviewed  Physical Exam   GEN: No acute distress.   Neck: JVD elevated ~ 8-10 cm. Cardiac: IRIR, no murmurs, rubs, or gallops.  Respiratory: Clear to auscultation bilaterally.  GI: Soft, nontender,  less distended.   MS: Moderate bilateral lower extremity edema; No deformity. Neuro:  Alert and oriented x 3; Nonfocal.  Psych: Normal affect.  Labs    Chemistry Recent Labs  Lab 09/28/21 1403 09/29/21 0351 09/30/21 0611  NA 138 138 137  K 4.1 3.6 3.4*  CL 105 104 101  CO2 22 22 26   GLUCOSE 105* 100* 121*  BUN 39* 44* 42*  CREATININE 3.25* 3.40* 3.37*  CALCIUM 8.6* 8.7* 8.3*  PROT 6.4* 6.3*  --   ALBUMIN 3.3* 3.1*  --   AST 24 21  --   ALT 17 15  --   ALKPHOS 92 100  --   BILITOT 3.8* 4.0*  --   GFRNONAA 21* 20* 20*  ANIONGAP 11 12 10      Hematology Recent Labs  Lab 09/28/21 1403 09/29/21 0351 09/30/21 0611  WBC 7.3 7.9 8.0  RBC 5.22 5.33 5.75  HGB 13.0 13.6 14.4  HCT 42.3 42.5 46.0  MCV 81.0 79.7* 80.0  MCH 24.9* 25.5* 25.0*  MCHC 30.7 32.0 31.3  RDW 18.8* 18.4* 19.1*  PLT 294 246 267    Cardiac EnzymesNo results for input(s): "TROPONINI" in the last 168 hours. No  results for input(s): "TROPIPOC" in the last 168 hours.   BNP Recent Labs  Lab 09/28/21 1403  BNP 3,806.7*     DDimer No results for input(s): "DDIMER" in the last 168 hours.   Radiology    DG Chest 2 View  Result Date: 09/28/2021 IMPRESSION: Enlarged cardiac silhouette with mixed pattern pulmonary edema. Electronically Signed   By: Fidela Salisbury M.D.   On: 09/28/2021 15:52    Cardiac Studies   2D echo 09/29/2021: 1. Left ventricular ejection fraction, by estimation, is <20%. The left  ventricle has severely decreased function. The left ventricle has no  regional wall motion abnormalities. The left ventricular internal cavity  size was moderately dilated. Left  ventricular diastolic parameters are indeterminate.   2. Right ventricular systolic function is mildly reduced. The right  ventricular size is normal. Tricuspid regurgitation signal is inadequate  for assessing PA pressure.   3. Left atrial size was moderately dilated.   4. Right atrial size was mildly dilated.   5.  The mitral valve is normal in structure. Moderate mitral valve  regurgitation. No evidence of mitral stenosis.   6. The aortic valve is normal in structure. Aortic valve regurgitation is  not visualized. Aortic valve sclerosis/calcification is present, without  any evidence of aortic stenosis.   7. The inferior vena cava is dilated in size with <50% respiratory  variability, suggesting right atrial pressure of 15 mmHg. __________  TEE 03/05/2021: 1. Left ventricular ejection fraction, by estimation, is <20%. The left  ventricle has severely decreased function. The left ventricular internal  cavity size was mildly dilated.   2. Right ventricular systolic function is severely reduced. The right  ventricular size is normal.   3. Left atrial size was mildly dilated. No left atrial/left atrial  appendage thrombus was detected.   4. The mitral valve is normal in structure. Mild to moderate mitral valve  regurgitation.   5. The aortic valve is tricuspid. Aortic valve regurgitation is not  visualized.   6. Agitated saline contrast bubble study was negative, with no evidence  of any interatrial shunt. __________   2D echo 10/29/2020: 1. Left ventricular ejection fraction, by estimation, is 30 to 35%. The  left ventricle has moderately decreased function. The left ventricle  demonstrates global hypokinesis. The left ventricular internal cavity size  was borderline dilated. There is mild   left ventricular hypertrophy.   2. Right ventricular systolic function is mildly reduced. The right  ventricular size is normal.   3. Mild to moderate mitral valve regurgitation. __________   2D echo 09/13/2020: 1. Left ventricular ejection fraction, by estimation, is 25 to 30%. The  left ventricle has severely decreased function. The left ventricle  demonstrates global hypokinesis. The left ventricular internal cavity size  was mildly dilated. Left ventricular  diastolic parameters are indeterminate. The  average left ventricular  global longitudinal strain is -6.7 %. The global longitudinal strain is  abnormal.   2. Right ventricular systolic function is moderately reduced. The right  ventricular size is normal. Tricuspid regurgitation signal is inadequate  for assessing PA pressure.   3. The mitral valve is normal in structure. Moderate mitral valve  regurgitation.   4. The inferior vena cava is dilated in size with <50% respiratory  variability, suggesting right atrial pressure of 15 mmHg.  __________   2D echo 01/31/2020: 1. Left ventricular ejection fraction, by estimation, is 45 to 50%. The  left ventricle has normal function. The left ventricle demonstrates  regional wall motion abnormalities. Abnormal (paradoxical) septal motion,  consistent with left bundle branch  block. Left ventricular diastolic parameters are indeterminate.   2. Right ventricular systolic function is normal. The right ventricular  size is normal. Tricuspid regurgitation signal is inadequate for assessing  PA pressure.   3. The mitral valve is normal in structure. No evidence of mitral valve  regurgitation.   4. The aortic valve was not well visualized. Aortic valve regurgitation  is not visualized. No aortic stenosis is present.   5. The inferior vena cava is normal in size with greater than 50%  respiratory variability, suggesting right atrial pressure of 3 mmHg. __________   Limited echo 12/03/2016: - Procedure narrative: This was a LIMITED TTE to assess LVEF.  - Left ventricle: The cavity size was mildly dilated. There was    mild concentric hypertrophy. Systolic function was mildly    reduced. The estimated ejection fraction was in the range of 45%    to 50%. Diffuse hypokinesis. __________   2D echo 06/04/2016: - Left ventricle: The cavity size was mildly dilated. There was    mild concentric hypertrophy. Systolic function was moderately to    severely reduced. The estimated ejection fraction  was in the    range of 30% to 35%. Diffuse hypokinesis. Features are consistent    with a pseudonormal left ventricular filling pattern, with    concomitant abnormal relaxation and increased filling pressure    (grade 2 diastolic dysfunction).  - Mitral valve: There was mild regurgitation.  - Left atrium: The atrium was moderately dilated.  - Pulmonary arteries: Systolic pressure could not be accurately    estimated. __________   2D echo 02/21/2015: - Left ventricle: The cavity size was normal. There was mild    concentric hypertrophy. Systolic function was normal. The    estimated ejection fraction was in the range of 50% to 55%.    Unable to exclude inferior-posterior wall hypokinesis. Left    ventricular diastolic function parameters were normal.  - Mitral valve: There was mild regurgitation.  - Left atrium: The atrium was mildly dilated.  - Right ventricle: Systolic function was normal.  - Pulmonary arteries: Systolic pressure was within the normal    range.   Impressions:   - Challenging image quality. Rhythm is normal sinus. __________   LHC 01/09/2015: Angiographically minimal CAD Non-ishcemic Cardiomyopathy Systemic Hypertension with Mild-Moderately increase LVEDP after pre-cath hydration.     This confirms the presumed diagnosis of Non-ischemic Cardiomyopathy.   Plan: Discharge home after Bed-rest & TR band removal Continue current Cardiomyopathy Medications - BB, Entresto & Lasix Will refer back to Dr. Caryl Comes for reconsideration of ICD. __________   TEE/DCCV 08/17/2014: - Left ventricle: Systolic function was severely reduced. The    estimated ejection fraction was in the range of 20% to 25%.    Diffuse hypokinesis. The study is not technically sufficient to    allow evaluation of LV diastolic function. No evidence of    thrombus.  - Mitral valve: There was mild regurgitation.  - Left atrium: No evidence of thrombus in the atrial cavity or    appendage. No  evidence of thrombus in the appendage. The    appendage was moderately to severely dilated. Emptying velocity    was normal.  - Right ventricle: The cavity size was moderately dilated. Systolic    function was moderately to severely reduced.  - Right atrium: The atrium was mildly dilated.  - Atrial septum: No defect or  patent foramen ovale was identified.   Impressions:   - Successful cardioversion. No cardiac source of emboli was    indentified in the LA, LA appendage or LV.  Patient Profile     56 y.o. 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 Dwyane Dee.  Assessment & Plan    1. Acute on chronic combined systolic and diastolic CHF secondary to NICM: -Volume overload likely in the context of prior medication nonadherence and while off diuretic -Continue IV Lasix 80 mg twice daily with close monitoring of renal function and electrolytes, stable currently -If he has worsening renal function with IV Lasix, we may need to consider milrinone infusion to augment diuresis, though in the context of medication nonadherence suspect he is not a candidate for further advanced heart failure therapies -PTA carvedilol, Farxiga, hydralazine -As BP allows would look to add Imdur to hydralazine -He has not been maintained on ACE inhibitor/ARB/ARNI/MRA in the context of prior hyperkalemia with underlying significant renal dysfunction -Echo as above -GI symptoms are likely in the setting of RV failure -He has been evaluated by EP and felt to not be a candidate for ICD given medication nonadherence, he would also not be a candidate for advanced heart failure modalities without documented medication compliance -Strict I's and O's -Daily  weights -CHF education   2. PAF/flutter: -Uncertain onset -PTA carvedilol -Has been nonadherent to PTA apixaban -Difficult to pursue TEE guided DCCV at this time given prior medication nonadherence in the outpatient setting -For now, ventricular rates are reasonably controlled on carvedilol -Eliquis 5 mg twice daily (he does not meet reduced dosing criteria) -Not a candidate for amiodarone given abnormal thyroid function -Not a candidate for ablation per EP given medication nonadherence   3.  CKD stage III-IV -Monitor with diuresis   4.  Medication nonadherence: -This seems to be his biggest driving force in hospital readmissions and overall symptomology -Compliance is encouraged       For questions or updates, please contact [place] Please consult [email or web address] for contact info under Cardiology/STEMI.    Signed, Christell Faith, PA-C [facility] Pager: [phone] 09/30/2021, 8:45 AM

## 2021-09-30 NOTE — Progress Notes (Signed)
Mobility Specialist - Progress Note     09/30/21 1101  Mobility  Activity Ambulated independently in hallway  Level of Assistance Independent  Assistive Device None  Distance Ambulated (ft) 60 ft  Activity Response Tolerated well  $Mobility charge 1 Mobility   Pt supine upon entry. Pt utilizing RA. Pt independently transferred to EOB and stood up. Pt ambulated up and down hallway three times. Pt left supine with needs in reach. No complaints.   Candie Mile Mobility Specialist 09/30/21 11:05 AM

## 2021-09-30 NOTE — Progress Notes (Addendum)
PROGRESS NOTE    Hector [name]  PYP:[id number] DOB: 03-24-1965 DOA: 09/28/2021  PCP: [name], MD   Brief Narrative:  This 56 years old male with PMH significant for alcohol use disorder (sober x 11 years), HFr EF(last LVEF less than 20%) nonischemic cardiomyopathy, CKD stage IIIb, hypertension, hyperlipidemia, A-fib on Eliquis, history of stroke, hypothyroidism, gout presented to the ED with generalized body swelling.  He has seen at heart failure clinic and was switched to torsemide.  However he did not feel that it was helping.  He was admitted in July and was stopped off all diuretics at discharge due to CKD worsening.  He was supposed to follow-up in cardiology but missed that appointment.  Patient has not been taking his medications for last 1 and half week due to not eating much.  In the ED he was found to have creatinine 3.25 which is improved from previous discharge in July, BNP 3800 up from prior.  Chest x-ray showed mixed pulmonary edema. Patient is admitted for decompensated acute on chronic combined systolic and diastolic CHF.  Cardiology is consulted recommended to continue aggressive diuresis.  Repeat echocardiogram shows EF less than 20%.  Continue with current plan.   Assessment & Plan:   Active Problems:   Nonischemic cardiomyopathy: EF 20-25%   Hyperlipidemia   Chronic kidney disease (CKD), stage III (moderate) (HCC)   Paroxysmal atrial fibrillation (HCC)   Essential hypertension   Acute on chronic HFrEF (heart failure with reduced ejection fraction) (HCC)   Chronic systolic CHF (congestive heart failure) (HCC)   Iron deficiency anemia   Hypothyroidism  Acute on chronic combined systolic and diastolic heart failure: Nonischemic cardiomyopathy: Patient presented with increased weight, generalized body swelling. BNP elevated, Continue IV Lasix 80 mg IV twice daily Monitor daily weight, intake output charting.  Monitor renal functions. Continue  Carvedilol, Farxiga and hydralazine. If blood pressure improves,  will add Imdur to hydralazine. Patient not on ACE inhibitor's or ARB's due to renal dysfunction. Cardiology is consulted , recommended to continue IV diuresis.   2D echocardiogram shows LVEF less than 20%.  Severely reduced LVEF. Continue supplemental oxygen as needed. Patient was evaluated by EP and felt not to be candidate for AICD given medication nonadherence.   Intake/Output Summary (Last 24 hours) at 09/30/2021 1354 Last data filed at 09/30/2021 1128 Gross per 24 hour  Intake 480 ml  Output 3350 ml  Net -2870 ml     CKD stage IV: Renal functions at baseline.   In fact improved from prior discharge. Continue to monitor serum creatinine.  Hyperlipidemia: Continue Lipitor 80 mg p.o. daily.  Paroxysmal atrial fibrillation: Heart rate is well-controlled.   Continue Coreg 6.125 mg p.o.twice daily Continue Eliquis 5 mg twice daily.  Essential hypertension: Continue hydralazine, Coreg  Iron deficiency anemia: Continue iron supplementation  Hypothyroidism: Continue levothyroxine  Medication noncompliance: Counseled and medication compliant encouraged.  Obesity / BMI 35: Diet and exercise discussed in detail.  Estimated body mass index is 35.21 kg/m as calculated from the following:   Height as of this encounter: 5\' 11"  (1.803 m).   Weight as of this encounter: 114.5 kg.   DVT prophylaxis: Eliquis Code Status: (Full code) Family Communication: No family at bed Disposition Plan:   Status is: Inpatient Admitted for acute on chronic combined and systolic and diastolic CHF on IV Lasix, cardiology is consulted. Continue aggressive diuresis.  Anticipated discharge home 10/01/2021.   Consultants:  Cardiology  Procedures: Echocardiogram Antimicrobials: None  Subjective:  Patient was seen and examined at bedside.  Overnight events noted.   He reports feeling much improved.  He has lost close to 3 L of  fluid.  He was sitting comfortably on the chair watching television.    Objective: Vitals:   09/30/21 0430 09/30/21 0605 09/30/21 0748 09/30/21 1132  BP: 118/86  (!) 134/111 114/85  Pulse: 79  98 98  Resp: 18  20 19   Temp: (!) 97.4 F (36.3 C)  98.4 F (36.9 C) 98.1 F (36.7 C)  TempSrc:   Oral Oral  SpO2: 93%  99% 96%  Weight:  114.5 kg    Height:        Intake/Output Summary (Last 24 hours) at 09/30/2021 1354 Last data filed at 09/30/2021 1128 Gross per 24 hour  Intake 480 ml  Output 3350 ml  Net -2870 ml   Filed Weights   09/28/21 1839 09/29/21 0500 09/30/21 0605  Weight: 120.1 kg 117 kg 114.5 kg    Examination:  General exam: Appears comfortable, not in any acute distress.  Deconditioned Respiratory system: CTA bilaterally, no wheezing, no crackles, normal respiratory effort. Cardiovascular system: S1-S2 heard, regular rate and rhythm, no murmur. Gastrointestinal system: Abdomen is soft, non-distended, non tender, bowel sounds + Central nervous system: Alert and oriented x3 . No focal neurological deficits. Extremities: Edema+, no cyanosis, no clubbing Skin: No rashes, lesions or ulcers Psychiatry: Judgement and insight appear normal. Mood & affect appropriate.     Data Reviewed: I have personally reviewed following labs and imaging studies  CBC: Recent Labs  Lab 09/28/21 1403 09/29/21 0351 09/30/21 0611  WBC 7.3 7.9 8.0  NEUTROABS 4.7  --   --   HGB 13.0 13.6 14.4  HCT 42.3 42.5 46.0  MCV 81.0 79.7* 80.0  PLT 294 246 381   Basic Metabolic Panel: Recent Labs  Lab 09/28/21 1403 09/29/21 0351 09/30/21 0611  NA 138 138 137  K 4.1 3.6 3.4*  CL 105 104 101  CO2 22 22 26   GLUCOSE 105* 100* 121*  BUN 39* 44* 42*  CREATININE 3.25* 3.40* 3.37*  CALCIUM 8.6* 8.7* 8.3*  MG 2.1 2.0 2.1  PHOS  --   --  4.1   GFR: Estimated Creatinine Clearance: 31.1 mL/min (A) (by C-G formula based on SCr of 3.37 mg/dL (H)). Liver Function Tests: Recent Labs  Lab  09/28/21 1403 09/29/21 0351  AST 24 21  ALT 17 15  ALKPHOS 92 100  BILITOT 3.8* 4.0*  PROT 6.4* 6.3*  ALBUMIN 3.3* 3.1*   Recent Labs  Lab 09/28/21 1403  LIPASE 26   No results for input(s): "AMMONIA" in the last 168 hours. Coagulation Profile: No results for input(s): "INR", "PROTIME" in the last 168 hours. Cardiac Enzymes: No results for input(s): "CKTOTAL", "CKMB", "CKMBINDEX", "TROPONINI" in the last 168 hours. BNP (last 3 results) No results for input(s): "PROBNP" in the last 8760 hours. HbA1C: No results for input(s): "HGBA1C" in the last 72 hours. CBG: No results for input(s): "GLUCAP" in the last 168 hours. Lipid Profile: No results for input(s): "CHOL", "HDL", "LDLCALC", "TRIG", "CHOLHDL", "LDLDIRECT" in the last 72 hours. Thyroid Function Tests: Recent Labs    09/28/21 1403  TSH 4.547*   Anemia Panel: No results for input(s): "VITAMINB12", "FOLATE", "FERRITIN", "TIBC", "IRON", "RETICCTPCT" in the last 72 hours. Sepsis Labs: No results for input(s): "PROCALCITON", "LATICACIDVEN" in the last 168 hours.  No results found for this or any previous visit (from the past 240 hour(s)).  Radiology Studies: ECHOCARDIOGRAM COMPLETE  Result Date: 09/29/2021    ECHOCARDIOGRAM REPORT   Patient Name:   Hector [name] Date of Exam: 09/29/2021 Medical Rec #:  [id number]           Height:       71.0 in Accession #:    [id number]          Weight:       257.9 lb Date of Birth:  1965-03-12           BSA:          2.349 m Patient Age:    26 years            BP:           120/105 mmHg Patient Gender: M                   HR:           87 bpm. Exam Location:  [facility] Procedure: 2D Echo, Cardiac Doppler, Color Doppler and Intracardiac            Opacification Agent Indications:     CHF-Acute Systolic V25.36  History:         Patient has prior history of Echocardiogram examinations, most                  recent 03/05/2021. Cardiomyopathy and CHF, Stroke,                  Arrythmias:Atrial  Fibrillation and Atrial Flutter; Risk                  Factors:Hypertension, Dyslipidemia and ETOH.  Sonographer:     Bernadene Person RDCS Referring Phys:  [place] Diagnosing Phys: Kathlyn Sacramento MD IMPRESSIONS  1. Left ventricular ejection fraction, by estimation, is <20%. The left ventricle has severely decreased function. The left ventricle has no regional wall motion abnormalities. The left ventricular internal cavity size was moderately dilated. Left ventricular diastolic parameters are indeterminate.  2. Right ventricular systolic function is mildly reduced. The right ventricular size is normal. Tricuspid regurgitation signal is inadequate for assessing PA pressure.  3. Left atrial size was moderately dilated.  4. Right atrial size was mildly dilated.  5. The mitral valve is normal in structure. Moderate mitral valve regurgitation. No evidence of mitral stenosis.  6. The aortic valve is normal in structure. Aortic valve regurgitation is not visualized. Aortic valve sclerosis/calcification is present, without any evidence of aortic stenosis.  7. The inferior vena cava is dilated in size with <50% respiratory variability, suggesting right atrial pressure of 15 mmHg. FINDINGS  Left Ventricle: Left ventricular ejection fraction, by estimation, is <20%. The left ventricle has severely decreased function. The left ventricle has no regional wall motion abnormalities. Definity contrast agent was given IV to delineate the left ventricular endocardial borders. The left ventricular internal cavity size was moderately dilated. There is no left ventricular hypertrophy. Left ventricular diastolic parameters are indeterminate. Right Ventricle: The right ventricular size is normal. No increase in right ventricular wall thickness. Right ventricular systolic function is mildly reduced. Tricuspid regurgitation signal is inadequate for assessing PA pressure. The tricuspid regurgitant velocity is 2.14 m/s, and with an  assumed right atrial pressure of 15 mmHg, the estimated right ventricular systolic pressure is 64.4 mmHg. Left Atrium: Left atrial size was moderately dilated. Right Atrium: Right atrial size was mildly dilated. Pericardium: There is no evidence of pericardial effusion. Mitral Valve:  The mitral valve is normal in structure. Moderate mitral valve regurgitation. No evidence of mitral valve stenosis. Tricuspid Valve: The tricuspid valve is normal in structure. Tricuspid valve regurgitation is trivial. No evidence of tricuspid stenosis. Aortic Valve: The aortic valve is normal in structure. Aortic valve regurgitation is not visualized. Aortic valve sclerosis/calcification is present, without any evidence of aortic stenosis. Pulmonic Valve: The pulmonic valve was normal in structure. Pulmonic valve regurgitation is not visualized. No evidence of pulmonic stenosis. Aorta: The aortic root is normal in size and structure. Venous: The inferior vena cava is dilated in size with less than 50% respiratory variability, suggesting right atrial pressure of 15 mmHg. IAS/Shunts: No atrial level shunt detected by color flow Doppler.  LEFT VENTRICLE PLAX 2D LVIDd:         5.84 cm LVIDs:         5.36 cm LV PW:         1.25 cm LV IVS:        1.17 cm LVOT diam:     2.20 cm LVOT Area:     3.80 cm  LV Volumes (MOD) LV vol d, MOD A2C: 128.0 ml LV vol d, MOD A4C: 131.0 ml LV vol s, MOD A2C: 83.0 ml LV vol s, MOD A4C: 95.3 ml LV SV MOD A2C:     45.0 ml LV SV MOD A4C:     131.0 ml LV SV MOD BP:      41.5 ml RIGHT VENTRICLE TAPSE (M-mode): 1.5 cm LEFT ATRIUM             Index LA diam:        4.60 cm 1.96 cm/m LA Vol (A2C):   95.9 ml 40.82 ml/m LA Vol (A4C):   94.6 ml 40.27 ml/m LA Biplane Vol: 95.1 ml 40.48 ml/m   AORTA Ao Root diam: 3.30 cm Ao Asc diam:  3.50 cm MR Peak grad:    85.4 mmHg    TRICUSPID VALVE MR Mean grad:    55.0 mmHg    TR Peak grad:   18.3 mmHg MR Vmax:         462.00 cm/s  TR Vmax:        214.00 cm/s MR Vmean:         355.0 cm/s MR PISA:         1.01 cm     SHUNTS MR PISA Eff ROA: 8 mm        Systemic Diam: 2.20 cm MR PISA Radius:  0.40 cm Kathlyn Sacramento MD Electronically signed by Kathlyn Sacramento MD Signature Date/Time: 09/29/2021/1:06:02 PM    Final    DG Chest 2 View  Result Date: 09/28/2021 CLINICAL DATA:  Shortness of breath. EXAM: CHEST - 2 VIEW COMPARISON:  Jul 22, 2021 FINDINGS: Enlarged cardiac silhouette. Mixed pattern pulmonary edema. There is no evidence of focal airspace consolidation, pleural effusion or pneumothorax. Osseous structures are without acute abnormality. Soft tissues are grossly normal. IMPRESSION: Enlarged cardiac silhouette with mixed pattern pulmonary edema. Electronically Signed   By: Fidela Salisbury M.D.   On: 09/28/2021 15:52     Scheduled Meds:  allopurinol  50 mg Oral Daily   apixaban  5 mg Oral BID   atorvastatin  80 mg Oral Daily   carvedilol  6.25 mg Oral BID WC   dapagliflozin propanediol  10 mg Oral QAC breakfast   ferrous gluconate  324 mg Oral Q breakfast   furosemide  80 mg Intravenous BID   hydrALAZINE  25 mg Oral TID   sodium chloride flush  3 mL Intravenous Q12H   sodium chloride flush  3 mL Intravenous Q12H   Continuous Infusions:  sodium chloride       LOS: 2 days    Time spent: 35 mins    Dejae Bernet, MD [facility]   If 7PM-7AM, please contact night-coverage

## 2021-10-01 DIAGNOSIS — I5023 Acute on chronic systolic (congestive) heart failure: Secondary | ICD-10-CM | POA: Diagnosis not present

## 2021-10-01 DIAGNOSIS — E669 Obesity, unspecified: Secondary | ICD-10-CM

## 2021-10-01 LAB — BASIC METABOLIC PANEL
Anion gap: 9 (ref 5–15)
BUN: 44 mg/dL — ABNORMAL HIGH (ref 6–20)
CO2: 26 mmol/L (ref 22–32)
Calcium: 8.4 mg/dL — ABNORMAL LOW (ref 8.9–10.3)
Chloride: 101 mmol/L (ref 98–111)
Creatinine, Ser: 3.23 mg/dL — ABNORMAL HIGH (ref 0.61–1.24)
GFR, Estimated: 21 mL/min — ABNORMAL LOW (ref >60)
Glucose, Bld: 128 mg/dL — ABNORMAL HIGH (ref 70–99)
Potassium: 3.7 mmol/L (ref 3.5–5.1)
Sodium: 136 mmol/L (ref 135–145)

## 2021-10-01 MED ORDER — ISOSORBIDE DINITRATE 10 MG PO TABS
10.0000 mg | ORAL_TABLET | Freq: Three times a day (TID) | ORAL | Status: DC
  Administered 2021-10-01: 10 mg via ORAL
  Filled 2021-10-01: qty 1

## 2021-10-01 MED ORDER — ISOSORBIDE DINITRATE 10 MG PO TABS
5.0000 mg | ORAL_TABLET | Freq: Two times a day (BID) | ORAL | Status: DC
  Filled 2021-10-01: qty 0.5

## 2021-10-01 MED ORDER — ISOSORBIDE DINITRATE 10 MG PO TABS
5.0000 mg | ORAL_TABLET | Freq: Two times a day (BID) | ORAL | Status: DC
Start: 2021-10-02 — End: 2021-10-02
  Administered 2021-10-02: 5 mg via ORAL
  Filled 2021-10-01: qty 0.5

## 2021-10-01 MED ORDER — LEVOTHYROXINE SODIUM 100 MCG PO TABS
100.0000 ug | ORAL_TABLET | Freq: Every day | ORAL | Status: DC
  Administered 2021-10-02: 100 ug via ORAL
  Filled 2021-10-01: qty 1

## 2021-10-01 NOTE — Progress Notes (Signed)
  Progress Note   Patient: Hector [name] UOH:[id number] DOB: Jun 24, 1965 DOA: 09/28/2021     3 DOS: the patient was seen and examined on 10/01/2021 at 1:30PM      Brief hospital course: [name] is a 56 y.o. 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  Admitted in July and all diuretics unfortunately stopped at discharge due to worsening CKD.  He was supposed to follow-up in cardiology but missed that appointment.    Then in the last few weeks he had not been taking his medications due to feeling poor appetite.  In the ER, BNP 3800, CXR with edema.     Assessment and Plan: * Acute on chronic systolic CHF (congestive heart failure) ([place]) Patient presented with LE swelling, dyspnea on exertion and orthopnea.  Has diuresed 8.5L and is improving.  Cr improving with diuresis. - Continue Furosemide 80 mg IV twice a day  - K supplement - Strict I/Os, daily weights, telemetry  - Daily monitoring renal function - Consult Cardiology, appreciate cares - Continue Isordil, Farxiga, hydralazine, Coreg    Obesity (BMI 30-39.9) BMI 34  Hypothyroidism TSH slightly high - Resume home levothyroxine  Iron deficiency anemia - Continue iron  CKD (chronic kidney disease), stage IV (HCC) Cr improving with diuresis  Essential hypertension BP low normal - Continue carvedilol, hydralazine, Isordil  Paroxysmal atrial fibrillation (HCC) HR controlled - Continue apixaban, Coreg  Hyperlipidemia - Continue atorvastatin          Subjective: Patient is feeling somewhat better, still has some fluid in his belly he feels, appetite is good, no fever, sputum, confusion.     Physical Exam: Vitals:   10/01/21 0629 10/01/21 0816 10/01/21 1153 10/01/21 1528  BP:  119/84 (!) 106/90 114/88  Pulse:  75 (!) 57 (!) 54  Resp:  18 18 14   Temp:  98.6 F (37 C) 98.3 F (36.8 C) 98.2 F (36.8 C)  TempSrc:  Oral  Oral   SpO2:  97% 98% 98%  Weight: 111.7 kg     Height:       Thin adult male, in recliner, sitting up, eating, watching television RRR, no murmurs, no peripheral edema Respiratory rate normal, lungs clear without rales or wheezes Abdomen soft without tenderness palpation or guarding, no ascites or distention Attention normal, affect normal, judgment and insight appear normal  Data Reviewed: Discussed with cardiology team Basic metabolic panel shows improving creatinine, sodium potassium normal Complete blood count normal Echocardiogram shows EF less than 20%, increased right atrial pressure      Disposition: Status is: Inpatient         Author: Edwin Dada, MD 10/01/2021 4:50 PM  For on call review [email or web address].

## 2021-10-01 NOTE — Assessment & Plan Note (Signed)
Continue atorvastatin

## 2021-10-01 NOTE — Assessment & Plan Note (Signed)
Patient presented with LE swelling, dyspnea on exertion and orthopnea.  Has diuresed 8.5L and is improving.  Cr improving with diuresis. - Continue Furosemide 80 mg IV twice a day  - K supplement - Strict I/Os, daily weights, telemetry  - Daily monitoring renal function - Consult Cardiology, appreciate cares - Continue Isordil, Farxiga, hydralazine, Coreg

## 2021-10-01 NOTE — Assessment & Plan Note (Signed)
Continue iron  

## 2021-10-01 NOTE — Hospital Course (Signed)
Mr. Mabus is a 56 y.o. 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  Admitted in July and all diuretics unfortunately stopped at discharge due to worsening CKD.  He was supposed to follow-up in cardiology but missed that appointment.    Then in the last few weeks he had not been taking his medications due to feeling poor appetite.  In the ER, BNP 3800, CXR with edema.

## 2021-10-01 NOTE — Hospital Course (Signed)
This 56 years old male with PMH significant for alcohol use disorder (sober x 11 years), HFr EF(last LVEF less than 20%) nonischemic cardiomyopathy, CKD stage IIIb, hypertension, hyperlipidemia, A-fib on Eliquis, history of stroke, hypothyroidism, gout presented to the ED with generalized body swelling.  He has seen at heart failure clinic and was switched to torsemide.  However he did not feel that it was helping.  He was admitted in July and was stopped off all diuretics at discharge due to CKD worsening.  He was supposed to follow-up in cardiology but missed that appointment.  Patient has not been taking his medications for last 1 and half week due to not eating much.  In the ED he was found to have creatinine 3.25 which is improved from previous discharge in July, BNP 3800 up from prior.  Chest x-ray showed mixed pulmonary edema. Patient is admitted for decompensated acute on chronic combined systolic and diastolic CHF.  Cardiology is consulted recommended to continue aggressive diuresis.  Repeat echocardiogram shows EF less than 20%.  Continue with current plan.

## 2021-10-01 NOTE — Assessment & Plan Note (Signed)
BMI 35 

## 2021-10-01 NOTE — Assessment & Plan Note (Signed)
Cr improving with diuresis

## 2021-10-01 NOTE — Assessment & Plan Note (Signed)
TSH slightly high - Resume home levothyroxine

## 2021-10-01 NOTE — Progress Notes (Signed)
Progress Note  Patient Name: Hector [name] Date of Encounter: 10/01/2021  Primary Cardiologist: Rockey Situ  Subjective   No chest pain or dyspnea. Abdomen less distended. Appetite improving. Documented UOP 3.1 L for the past 24 hours, net - 8.1 L for the admission. Renal function stable. Weight 114.5 to 111.7 kg.   Inpatient Medications    Scheduled Meds:  allopurinol  50 mg Oral Daily   apixaban  5 mg Oral BID   atorvastatin  80 mg Oral Daily   carvedilol  6.25 mg Oral BID WC   dapagliflozin propanediol  10 mg Oral QAC breakfast   ferrous gluconate  324 mg Oral Q breakfast   furosemide  80 mg Intravenous BID   hydrALAZINE  25 mg Oral TID   sodium chloride flush  3 mL Intravenous Q12H   sodium chloride flush  3 mL Intravenous Q12H   Continuous Infusions:  sodium chloride     PRN Meds: sodium chloride, acetaminophen **OR** acetaminophen, hydrALAZINE, Muscle Rub, ondansetron **OR** ondansetron (ZOFRAN) IV, oxyCODONE, senna-docusate, sodium chloride flush   Vital Signs    Vitals:   10/01/21 0045 10/01/21 0353 10/01/21 0629 10/01/21 0816  BP: 105/81 (!) 128/105  119/84  Pulse: 82 100  75  Resp: 20 19  18   Temp: (!) 97.5 F (36.4 C) 98.2 F (36.8 C)  98.6 F (37 C)  TempSrc: Oral Oral  Oral  SpO2: 98% 98%  97%  Weight:   111.7 kg   Height:        Intake/Output Summary (Last 24 hours) at 10/01/2021 1026 Last data filed at 10/01/2021 0816 Gross per 24 hour  Intake 1446 ml  Output 4575 ml  Net -3129 ml    Filed Weights   09/29/21 0500 09/30/21 0605 10/01/21 0629  Weight: 117 kg 114.5 kg 111.7 kg    Telemetry    Afib with ventricular rates in the 80s to low 100s bpm with rare aberrancy - Personally Reviewed  ECG    No new tracings - Personally Reviewed  Physical Exam   GEN: No acute distress.   Neck: JVD elevated ~ 8-10 cm. Cardiac: IRIR, no murmurs, rubs, or gallops.  Respiratory: Clear to auscultation bilaterally.  GI: Soft, nontender, less  distended.   MS: Moderate bilateral lower extremity edema; No deformity. Neuro:  Alert and oriented x 3; Nonfocal.  Psych: Normal affect.  Labs    Chemistry Recent Labs  Lab 09/28/21 1403 09/29/21 0351 09/30/21 0611 10/01/21 0500  NA 138 138 137 136  K 4.1 3.6 3.4* 3.7  CL 105 104 101 101  CO2 22 22 26 26   GLUCOSE 105* 100* 121* 128*  BUN 39* 44* 42* 44*  CREATININE 3.25* 3.40* 3.37* 3.23*  CALCIUM 8.6* 8.7* 8.3* 8.4*  PROT 6.4* 6.3*  --   --   ALBUMIN 3.3* 3.1*  --   --   AST 24 21  --   --   ALT 17 15  --   --   ALKPHOS 92 100  --   --   BILITOT 3.8* 4.0*  --   --   GFRNONAA 21* 20* 20* 21*  ANIONGAP 11 12 10 9       Hematology Recent Labs  Lab 09/28/21 1403 09/29/21 0351 09/30/21 0611  WBC 7.3 7.9 8.0  RBC 5.22 5.33 5.75  HGB 13.0 13.6 14.4  HCT 42.3 42.5 46.0  MCV 81.0 79.7* 80.0  MCH 24.9* 25.5* 25.0*  MCHC 30.7 32.0 31.3  RDW 18.8* 18.4* 19.1*  PLT 294 246 267     Cardiac EnzymesNo results for input(s): "TROPONINI" in the last 168 hours. No results for input(s): "TROPIPOC" in the last 168 hours.   BNP Recent Labs  Lab 09/28/21 1403  BNP 3,806.7*      DDimer No results for input(s): "DDIMER" in the last 168 hours.   Radiology    DG Chest 2 View  Result Date: 09/28/2021 IMPRESSION: Enlarged cardiac silhouette with mixed pattern pulmonary edema. Electronically Signed   By: Fidela Salisbury M.D.   On: 09/28/2021 15:52    Cardiac Studies   2D echo 09/29/2021: 1. Left ventricular ejection fraction, by estimation, is <20%. The left  ventricle has severely decreased function. The left ventricle has no  regional wall motion abnormalities. The left ventricular internal cavity  size was moderately dilated. Left  ventricular diastolic parameters are indeterminate.   2. Right ventricular systolic function is mildly reduced. The right  ventricular size is normal. Tricuspid regurgitation signal is inadequate  for assessing PA pressure.   3. Left  atrial size was moderately dilated.   4. Right atrial size was mildly dilated.   5. The mitral valve is normal in structure. Moderate mitral valve  regurgitation. No evidence of mitral stenosis.   6. The aortic valve is normal in structure. Aortic valve regurgitation is  not visualized. Aortic valve sclerosis/calcification is present, without  any evidence of aortic stenosis.   7. The inferior vena cava is dilated in size with <50% respiratory  variability, suggesting right atrial pressure of 15 mmHg. __________  TEE 03/05/2021: 1. Left ventricular ejection fraction, by estimation, is <20%. The left  ventricle has severely decreased function. The left ventricular internal  cavity size was mildly dilated.   2. Right ventricular systolic function is severely reduced. The right  ventricular size is normal.   3. Left atrial size was mildly dilated. No left atrial/left atrial  appendage thrombus was detected.   4. The mitral valve is normal in structure. Mild to moderate mitral valve  regurgitation.   5. The aortic valve is tricuspid. Aortic valve regurgitation is not  visualized.   6. Agitated saline contrast bubble study was negative, with no evidence  of any interatrial shunt. __________   2D echo 10/29/2020: 1. Left ventricular ejection fraction, by estimation, is 30 to 35%. The  left ventricle has moderately decreased function. The left ventricle  demonstrates global hypokinesis. The left ventricular internal cavity size  was borderline dilated. There is mild   left ventricular hypertrophy.   2. Right ventricular systolic function is mildly reduced. The right  ventricular size is normal.   3. Mild to moderate mitral valve regurgitation. __________   2D echo 09/13/2020: 1. Left ventricular ejection fraction, by estimation, is 25 to 30%. The  left ventricle has severely decreased function. The left ventricle  demonstrates global hypokinesis. The left ventricular internal cavity  size  was mildly dilated. Left ventricular  diastolic parameters are indeterminate. The average left ventricular  global longitudinal strain is -6.7 %. The global longitudinal strain is  abnormal.   2. Right ventricular systolic function is moderately reduced. The right  ventricular size is normal. Tricuspid regurgitation signal is inadequate  for assessing PA pressure.   3. The mitral valve is normal in structure. Moderate mitral valve  regurgitation.   4. The inferior vena cava is dilated in size with <50% respiratory  variability, suggesting right atrial pressure of 15 mmHg.  __________  2D echo 01/31/2020: 1. Left ventricular ejection fraction, by estimation, is 45 to 50%. The  left ventricle has normal function. The left ventricle demonstrates  regional wall motion abnormalities. Abnormal (paradoxical) septal motion,  consistent with left bundle branch  block. Left ventricular diastolic parameters are indeterminate.   2. Right ventricular systolic function is normal. The right ventricular  size is normal. Tricuspid regurgitation signal is inadequate for assessing  PA pressure.   3. The mitral valve is normal in structure. No evidence of mitral valve  regurgitation.   4. The aortic valve was not well visualized. Aortic valve regurgitation  is not visualized. No aortic stenosis is present.   5. The inferior vena cava is normal in size with greater than 50%  respiratory variability, suggesting right atrial pressure of 3 mmHg. __________   Limited echo 12/03/2016: - Procedure narrative: This was a LIMITED TTE to assess LVEF.  - Left ventricle: The cavity size was mildly dilated. There was    mild concentric hypertrophy. Systolic function was mildly    reduced. The estimated ejection fraction was in the range of 45%    to 50%. Diffuse hypokinesis. __________   2D echo 06/04/2016: - Left ventricle: The cavity size was mildly dilated. There was    mild concentric hypertrophy.  Systolic function was moderately to    severely reduced. The estimated ejection fraction was in the    range of 30% to 35%. Diffuse hypokinesis. Features are consistent    with a pseudonormal left ventricular filling pattern, with    concomitant abnormal relaxation and increased filling pressure    (grade 2 diastolic dysfunction).  - Mitral valve: There was mild regurgitation.  - Left atrium: The atrium was moderately dilated.  - Pulmonary arteries: Systolic pressure could not be accurately    estimated. __________   2D echo 02/21/2015: - Left ventricle: The cavity size was normal. There was mild    concentric hypertrophy. Systolic function was normal. The    estimated ejection fraction was in the range of 50% to 55%.    Unable to exclude inferior-posterior wall hypokinesis. Left    ventricular diastolic function parameters were normal.  - Mitral valve: There was mild regurgitation.  - Left atrium: The atrium was mildly dilated.  - Right ventricle: Systolic function was normal.  - Pulmonary arteries: Systolic pressure was within the normal    range.   Impressions:   - Challenging image quality. Rhythm is normal sinus. __________   LHC 01/09/2015: Angiographically minimal CAD Non-ishcemic Cardiomyopathy Systemic Hypertension with Mild-Moderately increase LVEDP after pre-cath hydration.     This confirms the presumed diagnosis of Non-ischemic Cardiomyopathy.   Plan: Discharge home after Bed-rest & TR band removal Continue current Cardiomyopathy Medications - BB, Entresto & Lasix Will refer back to Dr. Caryl Comes for reconsideration of ICD. __________   TEE/DCCV 08/17/2014: - Left ventricle: Systolic function was severely reduced. The    estimated ejection fraction was in the range of 20% to 25%.    Diffuse hypokinesis. The study is not technically sufficient to    allow evaluation of LV diastolic function. No evidence of    thrombus.  - Mitral valve: There was mild  regurgitation.  - Left atrium: No evidence of thrombus in the atrial cavity or    appendage. No evidence of thrombus in the appendage. The    appendage was moderately to severely dilated. Emptying velocity    was normal.  - Right ventricle: The cavity size was moderately dilated.  Systolic    function was moderately to severely reduced.  - Right atrium: The atrium was mildly dilated.  - Atrial septum: No defect or patent foramen ovale was identified.   Impressions:   - Successful cardioversion. No cardiac source of emboli was    indentified in the LA, LA appendage or LV.  Patient Profile     56 y.o. 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 Dwyane Dee.  Assessment & Plan    1. Acute on chronic combined systolic and diastolic CHF secondary to NICM: -Volume overload likely in the context of prior medication nonadherence and while off diuretic -Continue IV Lasix 80 mg twice daily with close monitoring of renal function and electrolytes, stable currently, likely at least another day -If he has worsening renal function with IV Lasix, we may need to consider milrinone infusion to augment diuresis, though in the context of medication nonadherence suspect he is not a candidate for further advanced heart failure therapies -PTA carvedilol, Farxiga, hydralazine -Add Isordil dinitrate  -He has not been maintained on ACE inhibitor/ARB/ARNI/MRA in the context of prior hyperkalemia with underlying significant renal dysfunction -Echo as above -GI symptoms are likely in the setting of RV failure -He has been evaluated by EP and felt to not be a candidate for ICD given medication nonadherence, he would also not be a candidate for advanced  heart failure modalities without documented medication compliance -Strict I's and O's -Daily weights -CHF education   2. PAF/flutter: -Uncertain onset -PTA carvedilol -Has been nonadherent to PTA apixaban -Difficult to pursue TEE guided DCCV at this time given prior medication nonadherence in the outpatient setting -For now, ventricular rates are reasonably controlled on carvedilol -Eliquis 5 mg twice daily (he does not meet reduced dosing criteria) -Not a candidate for amiodarone given abnormal thyroid function -Not a candidate for ablation per EP given medication nonadherence   3.  CKD stage III-IV: -Stable -Monitor with diuresis   4.  Medication nonadherence: -This seems to be his biggest driving force in hospital readmissions and overall symptomology -Compliance is encouraged       For questions or updates, please contact [place] Please consult [email or web address] for contact info under Cardiology/STEMI.    Signed, Christell Faith, PA-C [place] Pager: [phone] 10/01/2021, 10:26 AM

## 2021-10-01 NOTE — Assessment & Plan Note (Signed)
BP low normal - Continue carvedilol, hydralazine, Isordil

## 2021-10-01 NOTE — Assessment & Plan Note (Signed)
BMI 34 

## 2021-10-01 NOTE — Assessment & Plan Note (Signed)
HR controlled - Continue apixaban, Coreg

## 2021-10-02 DIAGNOSIS — I5023 Acute on chronic systolic (congestive) heart failure: Secondary | ICD-10-CM | POA: Diagnosis not present

## 2021-10-02 LAB — BASIC METABOLIC PANEL
Anion gap: 10 (ref 5–15)
BUN: 47 mg/dL — ABNORMAL HIGH (ref 6–20)
CO2: 25 mmol/L (ref 22–32)
Calcium: 8.5 mg/dL — ABNORMAL LOW (ref 8.9–10.3)
Chloride: 98 mmol/L (ref 98–111)
Creatinine, Ser: 3.36 mg/dL — ABNORMAL HIGH (ref 0.61–1.24)
GFR, Estimated: 21 mL/min — ABNORMAL LOW (ref >60)
Glucose, Bld: 127 mg/dL — ABNORMAL HIGH (ref 70–99)
Potassium: 3.6 mmol/L (ref 3.5–5.1)
Sodium: 133 mmol/L — ABNORMAL LOW (ref 135–145)

## 2021-10-02 MED ORDER — CARVEDILOL 6.25 MG PO TABS: 6.2500 mg | ORAL_TABLET | Freq: Two times a day (BID) | ORAL | 3 refills | Status: DC

## 2021-10-02 MED ORDER — TORSEMIDE 40 MG PO TABS: 40.0000 mg | ORAL_TABLET | Freq: Every day | ORAL | 3 refills | Status: DC

## 2021-10-02 MED ORDER — ISOSORBIDE DINITRATE 5 MG PO TABS: 5.0000 mg | ORAL_TABLET | Freq: Two times a day (BID) | ORAL | 3 refills | Status: DC

## 2021-10-02 MED ORDER — APIXABAN 5 MG PO TABS: 5.0000 mg | ORAL_TABLET | Freq: Two times a day (BID) | ORAL | 6 refills | Status: DC

## 2021-10-02 MED ORDER — ALLOPURINOL 100 MG PO TABS: 50.0000 mg | ORAL_TABLET | Freq: Every day | ORAL | 3 refills | Status: DC

## 2021-10-02 MED ORDER — TORSEMIDE 20 MG PO TABS
40.0000 mg | ORAL_TABLET | Freq: Every day | ORAL | Status: DC
  Administered 2021-10-02: 40 mg via ORAL
  Filled 2021-10-02: qty 2

## 2021-10-02 MED ORDER — DAPAGLIFLOZIN PROPANEDIOL 10 MG PO TABS: 10.0000 mg | ORAL_TABLET | Freq: Every day | ORAL | 3 refills | Status: DC

## 2021-10-02 MED ORDER — HYDRALAZINE HCL 25 MG PO TABS: 25.0000 mg | ORAL_TABLET | Freq: Three times a day (TID) | ORAL | 3 refills | Status: DC

## 2021-10-02 NOTE — Evaluation (Signed)
Physical Therapy Evaluation Patient Details Name: Hector [name] MRN: [id number] DOB: 12/12/1965 Today's Date: 10/02/2021  History of Present Illness  This 56 years old male with PMH significant for alcohol use disorder(sober x 11 years), HFr EF(last LVEF less than 20%) nonischemic cardiomyopathy, CKD stage IIIb, hypertension, hyperlipidemia, A-fib on Eliquis, history of stroke, hypothyroidism, gout presented to the ED with generalized body swelling.  He has seen heart failure clinic and was switched to torsemide.  However he did not feel that it was helping.  He was admitted in July and was stopped off all diuretics at discharge due to CKD worsening.  He was supposed to follow-up in cardiology but missed that appointment.  Patient has not been taking his medication for last 1 and half week due to not eating much.  In the ED he was found to have creatinine 3.25 which is improved from previous discharge in July, BNP 3800 up from prior.  Chest x-ray showed mixed pulmonary edema.  Patient is admitted for decompensated acute on chronic combined systolic and diastolic CHF.  Clinical Impression  Pt received sitting on the EOB agreed to participate in selective parts of the PT evaluation. Pt refused to wear proper foot wear and walked in the room and in the hallways in regular sock despite informing the pt of fall risk. Pt refused to attempt steps and is showing desire to return home. Pt is Independent in room for bathroom privileges. Pt is noncompliant and will benefit form HHPT for a safe discharge home. PT remains guarded regarding pt's prognosis of compliance to safe functional mobility 2/2 behavioral issues.      Recommendations for follow up therapy are one component of a multi-disciplinary discharge planning process, led by the attending physician.  Recommendations may be updated based on patient status, additional functional criteria and insurance authorization.  Follow Up Recommendations Home  health PT (Becasue pt is impulsive and non compliant)      Assistance Recommended at Discharge Set up Supervision/Assistance  Patient can return home with the following  Help with stairs or ramp for entrance;Direct supervision/assist for medications management;Direct supervision/assist for financial management;Assist for transportation    Equipment Recommendations None recommended by PT  Recommendations for Other Services       Functional Status Assessment Patient has had a recent decline in their functional status and demonstrates the ability to make significant improvements in function in a reasonable and predictable amount of time.     Precautions / Restrictions Precautions Precautions: Fall Restrictions Weight Bearing Restrictions: No      Mobility  Bed Mobility Overal bed mobility: Independent                  Transfers Overall transfer level: Independent Equipment used: None               General transfer comment: refused to wear non skid socks or walking shoes.    Ambulation/Gait Ambulation/Gait assistance: Supervision Gait Distance (Feet): 40 Feet Assistive device: None Gait Pattern/deviations: WFL(Within Functional Limits) Gait velocity: slow        Stairs Stairs:  (refused to attempt steps.)          Wheelchair Mobility    Modified Rankin (Stroke Patients Only)       Balance Overall balance assessment: Modified Independent                               Standardized Balance Assessment Standardized  Balance Assessment :  (refused)           Pertinent Vitals/Pain Pain Assessment Pain Assessment: No/denies pain    Home Living Family/patient expects to be discharged to:: Private residence Living Arrangements: Alone (mom in resting home) Available Help at Discharge:  (none) Type of Home: House Home Access: Stairs to enter Entrance Stairs-Rails: Can reach both Entrance Stairs-Number of Steps: 2   Home  Layout: One level        Prior Function Prior Level of Function : Independent/Modified Independent             Mobility Comments: Independent at house hold level activities and friend help with grocery shopping. ADLs Comments: Independent     Hand Dominance   Dominant Hand: Right    Extremity/Trunk Assessment   Upper Extremity Assessment Upper Extremity Assessment: Overall WFL for tasks assessed    Lower Extremity Assessment Lower Extremity Assessment: Generalized weakness       Communication   Communication: No difficulties  Cognition Arousal/Alertness: Awake/alert Behavior During Therapy: Agitated, Impulsive Overall Cognitive Status: Difficult to assess (doesnt think it is important to know the date or month or year.)                                 General Comments: refuses to aprticiapte        General Comments      Exercises     Assessment/Plan    PT Assessment Patient needs continued PT services (pt refuses as he walks in the hallway independently.)  PT Problem List Decreased strength;Decreased balance;Decreased safety awareness;Obesity;Cardiopulmonary status limiting activity       PT Treatment Interventions Gait training;Therapeutic activities;Neuromuscular re-education;Patient/family education;Stair training    PT Goals (Current goals can be found in the Care Plan section)  Acute Rehab PT Goals Patient Stated Goal: " I want to go home. I don't need anything." PT Goal Formulation: With patient Time For Goal Achievement: 10/09/21 Potential to Achieve Goals: Good (guarded 2/2 to behavior impairments.)    Frequency Min 1X/week     Co-evaluation               AM-PAC PT "6 Clicks" Mobility  Outcome Measure Help needed turning from your back to your side while in a flat bed without using bedrails?: None Help needed moving from lying on your back to sitting on the side of a flat bed without using bedrails?: None Help needed  moving to and from a bed to a chair (including a wheelchair)?: None Help needed standing up from a chair using your arms (e.g., wheelchair or bedside chair)?: None Help needed to walk in hospital room?: A Little   6 Click Score: 19    End of Session Equipment Utilized During Treatment:  (pt refused gait belt) Activity Tolerance: Patient tolerated treatment well Patient left: in bed Nurse Communication: Mobility status (behavior status) PT Visit Diagnosis: Unsteadiness on feet (R26.81);Muscle weakness (generalized) (M62.81)    Time: 5400-8676 PT Time Calculation (min) (ACUTE ONLY): 15 min   Charges:   PT Evaluation $PT Eval Moderate Complexity: 1 Mod PT Treatments $Gait Training: 8-22 mins       Joaquin Music PT DPT 12:45 PM,10/02/21

## 2021-10-02 NOTE — Progress Notes (Signed)
Progress Note  Patient Name: Hector [name] Date of Encounter: 10/02/2021  [facility] Cardiologist: Ida Rogue, MD   Subjective   UOP -2.9L. Patient is refusing to wear shoes during PT eval. He says he is ready to go home. He denies chest pain or SOB. appears euvolemic on exam.   Inpatient Medications    Scheduled Meds:  allopurinol  50 mg Oral Daily   apixaban  5 mg Oral BID   atorvastatin  80 mg Oral Daily   carvedilol  6.25 mg Oral BID WC   dapagliflozin propanediol  10 mg Oral QAC breakfast   ferrous gluconate  324 mg Oral Q breakfast   furosemide  80 mg Intravenous BID   hydrALAZINE  25 mg Oral TID   isosorbide dinitrate  5 mg Oral BID   levothyroxine  100 mcg Oral QAC breakfast   sodium chloride flush  3 mL Intravenous Q12H   sodium chloride flush  3 mL Intravenous Q12H   Continuous Infusions:  sodium chloride     PRN Meds: sodium chloride, acetaminophen **OR** acetaminophen, hydrALAZINE, Muscle Rub, ondansetron **OR** ondansetron (ZOFRAN) IV, oxyCODONE, senna-docusate, sodium chloride flush   Vital Signs    Vitals:   10/01/21 2347 10/02/21 0407 10/02/21 0453 10/02/21 0820  BP: 106/82 (!) 115/96  (!) 132/98  Pulse: 95 96  81  Resp: 19 19  14   Temp: (!) 97.5 F (36.4 C) (!) 97.5 F (36.4 C)  97.8 F (36.6 C)  TempSrc: Oral Oral  Oral  SpO2: 95% 97%  96%  Weight:   111 kg   Height:        Intake/Output Summary (Last 24 hours) at 10/02/2021 1002 Last data filed at 10/02/2021 0829 Gross per 24 hour  Intake 726 ml  Output 3550 ml  Net -2824 ml      10/02/2021    4:53 AM 10/01/2021    6:29 AM 09/30/2021    6:05 AM  Last 3 Weights  Weight (lbs) 244 lb 11.2 oz 246 lb 3.2 oz 252 lb 6.8 oz  Weight (kg) 110.995 kg 111.676 kg 114.5 kg      Telemetry    Afib, HR 90-110 - Personally Reviewed  ECG    No new - Personally Reviewed  Physical Exam   GEN: No acute distress.   Neck: No JVD Cardiac: Irreg Irreg, no murmurs, rubs, or gallops.   Respiratory: Clear to auscultation bilaterally. GI: Soft, nontender, non-distended  MS: No edema; No deformity. Neuro:  Nonfocal  Psych: Normal affect   Labs    High Sensitivity Troponin:  No results for input(s): "TROPONINIHS" in the last 720 hours.   Chemistry Recent Labs  Lab 09/28/21 1403 09/29/21 0351 09/30/21 0611 10/01/21 0500 10/02/21 0600  NA 138 138 137 136 133*  K 4.1 3.6 3.4* 3.7 3.6  CL 105 104 101 101 98  CO2 22 22 26 26 25   GLUCOSE 105* 100* 121* 128* 127*  BUN 39* 44* 42* 44* 47*  CREATININE 3.25* 3.40* 3.37* 3.23* 3.36*  CALCIUM 8.6* 8.7* 8.3* 8.4* 8.5*  MG 2.1 2.0 2.1  --   --   PROT 6.4* 6.3*  --   --   --   ALBUMIN 3.3* 3.1*  --   --   --   AST 24 21  --   --   --   ALT 17 15  --   --   --   ALKPHOS 92 100  --   --   --  BILITOT 3.8* 4.0*  --   --   --   GFRNONAA 21* 20* 20* 21* 21*  ANIONGAP 11 12 10 9 10     Lipids No results for input(s): "CHOL", "TRIG", "HDL", "LABVLDL", "LDLCALC", "CHOLHDL" in the last 168 hours.  Hematology Recent Labs  Lab 09/28/21 1403 09/29/21 0351 09/30/21 0611  WBC 7.3 7.9 8.0  RBC 5.22 5.33 5.75  HGB 13.0 13.6 14.4  HCT 42.3 42.5 46.0  MCV 81.0 79.7* 80.0  MCH 24.9* 25.5* 25.0*  MCHC 30.7 32.0 31.3  RDW 18.8* 18.4* 19.1*  PLT 294 246 267   Thyroid  Recent Labs  Lab 09/28/21 1403  TSH 4.547*    BNP Recent Labs  Lab 09/28/21 1403  BNP 3,806.7*    DDimer No results for input(s): "DDIMER" in the last 168 hours.   Radiology    No results found.  Cardiac Studies   2D echo 09/29/2021: 1. Left ventricular ejection fraction, by estimation, is <20%. The left  ventricle has severely decreased function. The left ventricle has no  regional wall motion abnormalities. The left ventricular internal cavity  size was moderately dilated. Left  ventricular diastolic parameters are indeterminate.   2. Right ventricular systolic function is mildly reduced. The right  ventricular size is normal. Tricuspid  regurgitation signal is inadequate  for assessing PA pressure.   3. Left atrial size was moderately dilated.   4. Right atrial size was mildly dilated.   5. The mitral valve is normal in structure. Moderate mitral valve  regurgitation. No evidence of mitral stenosis.   6. The aortic valve is normal in structure. Aortic valve regurgitation is  not visualized. Aortic valve sclerosis/calcification is present, without  any evidence of aortic stenosis.   7. The inferior vena cava is dilated in size with <50% respiratory  variability, suggesting right atrial pressure of 15 mmHg. __________   TEE 03/05/2021: 1. Left ventricular ejection fraction, by estimation, is <20%. The left  ventricle has severely decreased function. The left ventricular internal  cavity size was mildly dilated.   2. Right ventricular systolic function is severely reduced. The right  ventricular size is normal.   3. Left atrial size was mildly dilated. No left atrial/left atrial  appendage thrombus was detected.   4. The mitral valve is normal in structure. Mild to moderate mitral valve  regurgitation.   5. The aortic valve is tricuspid. Aortic valve regurgitation is not  visualized.   6. Agitated saline contrast bubble study was negative, with no evidence  of any interatrial shunt. __________   2D echo 10/29/2020: 1. Left ventricular ejection fraction, by estimation, is 30 to 35%. The  left ventricle has moderately decreased function. The left ventricle  demonstrates global hypokinesis. The left ventricular internal cavity size  was borderline dilated. There is mild   left ventricular hypertrophy.   2. Right ventricular systolic function is mildly reduced. The right  ventricular size is normal.   3. Mild to moderate mitral valve regurgitation. __________   2D echo 09/13/2020: 1. Left ventricular ejection fraction, by estimation, is 25 to 30%. The  left ventricle has severely decreased function. The left ventricle   demonstrates global hypokinesis. The left ventricular internal cavity size  was mildly dilated. Left ventricular  diastolic parameters are indeterminate. The average left ventricular  global longitudinal strain is -6.7 %. The global longitudinal strain is  abnormal.   2. Right ventricular systolic function is moderately reduced. The right  ventricular size is normal. Tricuspid  regurgitation signal is inadequate  for assessing PA pressure.   3. The mitral valve is normal in structure. Moderate mitral valve  regurgitation.   4. The inferior vena cava is dilated in size with <50% respiratory  variability, suggesting right atrial pressure of 15 mmHg.  __________   2D echo 01/31/2020: 1. Left ventricular ejection fraction, by estimation, is 45 to 50%. The  left ventricle has normal function. The left ventricle demonstrates  regional wall motion abnormalities. Abnormal (paradoxical) septal motion,  consistent with left bundle branch  block. Left ventricular diastolic parameters are indeterminate.   2. Right ventricular systolic function is normal. The right ventricular  size is normal. Tricuspid regurgitation signal is inadequate for assessing  PA pressure.   3. The mitral valve is normal in structure. No evidence of mitral valve  regurgitation.   4. The aortic valve was not well visualized. Aortic valve regurgitation  is not visualized. No aortic stenosis is present.   5. The inferior vena cava is normal in size with greater than 50%  respiratory variability, suggesting right atrial pressure of 3 mmHg. __________   Limited echo 12/03/2016: - Procedure narrative: This was a LIMITED TTE to assess LVEF.  - Left ventricle: The cavity size was mildly dilated. There was    mild concentric hypertrophy. Systolic function was mildly    reduced. The estimated ejection fraction was in the range of 45%    to 50%. Diffuse hypokinesis. __________   2D echo 06/04/2016: - Left ventricle: The  cavity size was mildly dilated. There was    mild concentric hypertrophy. Systolic function was moderately to    severely reduced. The estimated ejection fraction was in the    range of 30% to 35%. Diffuse hypokinesis. Features are consistent    with a pseudonormal left ventricular filling pattern, with    concomitant abnormal relaxation and increased filling pressure    (grade 2 diastolic dysfunction).  - Mitral valve: There was mild regurgitation.  - Left atrium: The atrium was moderately dilated.  - Pulmonary arteries: Systolic pressure could not be accurately    estimated. __________   2D echo 02/21/2015: - Left ventricle: The cavity size was normal. There was mild    concentric hypertrophy. Systolic function was normal. The    estimated ejection fraction was in the range of 50% to 55%.    Unable to exclude inferior-posterior wall hypokinesis. Left    ventricular diastolic function parameters were normal.  - Mitral valve: There was mild regurgitation.  - Left atrium: The atrium was mildly dilated.  - Right ventricle: Systolic function was normal.  - Pulmonary arteries: Systolic pressure was within the normal    range.   Impressions:   - Challenging image quality. Rhythm is normal sinus. __________   LHC 01/09/2015: Angiographically minimal CAD Non-ishcemic Cardiomyopathy Systemic Hypertension with Mild-Moderately increase LVEDP after pre-cath hydration.     This confirms the presumed diagnosis of Non-ischemic Cardiomyopathy.   Plan: Discharge home after Bed-rest & TR band removal Continue current Cardiomyopathy Medications - BB, Entresto & Lasix Will refer back to Dr. Caryl Comes for reconsideration of ICD. __________   TEE/DCCV 08/17/2014: - Left ventricle: Systolic function was severely reduced. The    estimated ejection fraction was in the range of 20% to 25%.    Diffuse hypokinesis. The study is not technically sufficient to    allow evaluation of LV diastolic  function. No evidence of    thrombus.  - Mitral valve: There was mild regurgitation.  -  Left atrium: No evidence of thrombus in the atrial cavity or    appendage. No evidence of thrombus in the appendage. The    appendage was moderately to severely dilated. Emptying velocity    was normal.  - Right ventricle: The cavity size was moderately dilated. Systolic    function was moderately to severely reduced.  - Right atrium: The atrium was mildly dilated.  - Atrial septum: No defect or patent foramen ovale was identified.   Impressions:   - Successful cardioversion. No cardiac source of emboli was    indentified in the LA, LA appendage or LV.    Patient Profile     56 y.o. 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, HTN, HLD, paroxysmal Afib/flutter, DM2, hypothyroidism, OSA on CPAP, CKD stage III-IV, medication noncompliance, GI bleed on Eliquis, possible sleep apnea who is being seen today for the evaluation of volume overload  Assessment & Plan    Acute on chronic combined systolic and diastolic CHF  NICM - volume overload in the setting of medication noncompliance - IV lasix 80mg  BID - Net -10.9L - continue PTA Coreg, Farxiga, Hydralazine - Isodil added - CKD limits use of Acei/ARB/ARNI/MRA - Echo showed LVEF <50%, no WMA, mildly reduced RV function, moderate MR - has seen EP in the past, not a good candidate for ICD - plan to switch to Torsemide 40mg  daily  PAF/flutter - continue Coreg for rate control - rates fairly well controlled - Eliquis 5mg  BID - not a candidate for ablation given medication noncompliance  CKD stage 3-4 - stable around baseline  Medication nonadherence - dicussed medication compliance  For questions or updates, please contact [facility] Please consult [email or web address] for contact info under        Signed, Liticia Gasior Ninfa Meeker, PA-C  10/02/2021, 10:02 AM

## 2021-10-02 NOTE — Progress Notes (Signed)
CSW spoke with patient regarding DC and HH PT recs, he declines HHPT and reports no dc needs. States he can call someone to pick him up at discharge today.   [place], [place]

## 2021-10-02 NOTE — Progress Notes (Signed)
Hector [name] to be D/C'd Home per MD order.  Discussed prescriptions and follow up appointments with the patient. Prescriptions given to patient, medication list explained in detail. Pt verbalized understanding. Pt states he doesn't have a way to get home, taxi voucher provided.  Allergies as of 10/02/2021       Reactions   Esomeprazole Magnesium Other (See Comments)   Suspected interstitial nephritis 2018   Eggs Or Egg-derived Products Rash        Medication List     STOP taking these medications    metoprolol succinate 25 MG 24 hr tablet Commonly known as: TOPROL-XL       TAKE these medications    allopurinol 100 MG tablet Commonly known as: ZYLOPRIM Take 0.5 tablets (50 mg total) by mouth daily. Start taking on: October 03, 2021 What changed:  how much to take additional instructions   apixaban 5 MG Tabs tablet Commonly known as: ELIQUIS Take 1 tablet (5 mg total) by mouth 2 (two) times daily.   atorvastatin 80 MG tablet Commonly known as: LIPITOR Take 1 tablet (80 mg total) by mouth daily.   carvedilol 6.25 MG tablet Commonly known as: COREG Take 1 tablet (6.25 mg total) by mouth 2 (two) times daily with a meal.   dapagliflozin propanediol 10 MG Tabs tablet Commonly known as: Farxiga Take 1 tablet (10 mg total) by mouth daily before breakfast.   ferrous gluconate 324 MG tablet Commonly known as: FERGON Take 324 mg by mouth daily with breakfast.   hydrALAZINE 25 MG tablet Commonly known as: APRESOLINE Take 1 tablet (25 mg total) by mouth 3 (three) times daily.   isosorbide dinitrate 5 MG tablet Commonly known as: ISORDIL Take 1 tablet (5 mg total) by mouth 2 (two) times daily.   levothyroxine 100 MCG tablet Commonly known as: SYNTHROID Take 100 mcg by mouth daily before breakfast.   Torsemide 40 MG Tabs Take 40 mg by mouth daily. Start taking on: October 03, 2021 What changed:  medication strength how much to take        Vitals:    10/02/21 0820 10/02/21 1156  BP: (!) 132/98 104/67  Pulse: 81 (!) 49  Resp: 14 18  Temp: 97.8 F (36.6 C) 97.6 F (36.4 C)  SpO2: 96% 96%    Tele box removed and returned. Skin clean, dry and intact without evidence of skin break down, no evidence of skin tears noted. IV catheter discontinued intact. Site without signs and symptoms of complications. Dressing and pressure applied. Pt denies pain at this time. No complaints noted.  An After Visit Summary was printed and given to the patient. Patient escorted via [place], and D/C home via private auto.  Hector [name]

## 2021-10-02 NOTE — Plan of Care (Signed)
  Problem: Education: Goal: Ability to demonstrate management of disease process will improve Outcome: Progressing Goal: Ability to verbalize understanding of medication therapies will improve Outcome: Progressing   Problem: Cardiac: Goal: Ability to achieve and maintain adequate cardiopulmonary perfusion will improve Outcome: Progressing   

## 2021-10-02 NOTE — Discharge Summary (Signed)
Physician Discharge Summary   Patient: Hector [name] MRN: [id number] DOB: February 12, 1966  Admit date:     09/28/2021  Discharge date: 10/02/21  Discharge Physician: Edwin Dada   PCP: [name], MD     Recommendations at discharge:  Follow up with PCP Dr. Rebeca Alert in 1 week Dr. Rebeca Alert: Please obtain BMP in 1 week Follow up with Dr. Rockey Situ Cardiology in the next month Follow up with Nephrology     Discharge Diagnoses: Principal Problem:   Acute on chronic systolic CHF (congestive heart failure) ([place]) Active Problems:   Hyperlipidemia   Paroxysmal atrial fibrillation ([place])   Essential hypertension   CKD (chronic kidney disease), stage IV ([facility])   Iron deficiency anemia   Hypothyroidism   Obesity (BMI 30-39.9)       Hospital Course: Hector [name] is a 56 y.o. 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  Admitted in July and all diuretics unfortunately stopped at discharge due to worsening CKD.  He was supposed to follow-up in cardiology but missed that appointment.    Then in the last few weeks prior to this admission, he had not been taking his medications due to feeling poor appetite.  Finally presented with SOB on exertion, swelling and orthopnea.  In the ER, BNP 3800, CXR with edema.     * Acute on chronic systolic CHF (congestive heart failure) ([place]) Patient presented with LE swelling, dyspnea on exertion and orthopnea.   He was started on IV diuretics, removed 11.2L fluid and felt better.  Cr improved to 3.3 mg/dL with diuresis.  This is likely his new baseline.  He needs follow up with Nephrology.      Cardiology follow up arranged.                [facility] Controlled Substances Registry was reviewed for this patient prior to discharge.   Consultants: Cardiology   Disposition: Home Diet recommendation:  Discharge Diet Orders (From admission,  onward)     Start     Ordered   10/02/21 0000  Diet - low sodium heart healthy        10/02/21 1305             DISCHARGE MEDICATION: Allergies as of 10/02/2021       Reactions   Esomeprazole Magnesium Other (See Comments)   Suspected interstitial nephritis 2018   Eggs Or Egg-derived Products Rash        Medication List     STOP taking these medications    metoprolol succinate 25 MG 24 hr tablet Commonly known as: TOPROL-XL       TAKE these medications    allopurinol 100 MG tablet Commonly known as: ZYLOPRIM Take 0.5 tablets (50 mg total) by mouth daily. Start taking on: October 03, 2021 What changed:  how much to take additional instructions   apixaban 5 MG Tabs tablet Commonly known as: ELIQUIS Take 1 tablet (5 mg total) by mouth 2 (two) times daily.   atorvastatin 80 MG tablet Commonly known as: LIPITOR Take 1 tablet (80 mg total) by mouth daily.   carvedilol 6.25 MG tablet Commonly known as: COREG Take 1 tablet (6.25 mg total) by mouth 2 (two) times daily with a meal.   dapagliflozin propanediol 10 MG Tabs tablet Commonly known as: Farxiga Take 1 tablet (10 mg total) by mouth daily before breakfast.   ferrous gluconate 324  MG tablet Commonly known as: FERGON Take 324 mg by mouth daily with breakfast.   hydrALAZINE 25 MG tablet Commonly known as: APRESOLINE Take 1 tablet (25 mg total) by mouth 3 (three) times daily.   isosorbide dinitrate 5 MG tablet Commonly known as: ISORDIL Take 1 tablet (5 mg total) by mouth 2 (two) times daily.   levothyroxine 100 MCG tablet Commonly known as: SYNTHROID Take 100 mcg by mouth daily before breakfast.   Torsemide 40 MG Tabs Take 40 mg by mouth daily. Start taking on: October 03, 2021 What changed:  medication strength how much to take        Follow-up Information     Bender, Durene Cal, MD. Schedule an appointment as soon as possible for a visit in 1 week(s).   Specialty: Family  Medicine Contact information: [place] [address] [phone]         [name], MD. Call.   Specialty: Cardiology Contact information: [place] [address] [phone]                 Discharge Instructions     Diet - low sodium heart healthy   Complete by: As directed    Discharge instructions   Complete by: As directed    You were admitted for heart failure Do not miss doses of your medications See the above changes We have sent refills of your medicines to your pharmacy Your torsemide dose has been increased We have started a new medicine Isordil You should stop taking metoprolol Your allopurinol dose has been decreased due to your kidney function  Go see your primary care doctor in 1 week Go see Dr. Rockey Situ your cardiologist as soon as possible. If the Cardiology team didn't give you the appoitnment date, call Dr. Gwenyth Ober' office to find the date   Increase activity slowly   Complete by: As directed        Discharge Exam: Filed Weights   09/30/21 0605 10/01/21 0629 10/02/21 0453  Weight: 114.5 kg 111.7 kg 111 kg    General: Pt is alert, awake, not in acute distress Cardiovascular: RRR, nl S1-S2, no murmurs appreciated.   No LE edema.   Respiratory: Normal respiratory rate and rhythm.  CTAB without rales or wheezes. Abdominal: Abdomen soft and non-tender.  No distension or HSM.   Neuro/Psych: Strength symmetric in upper and lower extremities.  Judgment and insight appear normal.   Condition at discharge: good  The results of significant diagnostics from this hospitalization (including imaging, microbiology, ancillary and laboratory) are listed below for reference.   Imaging Studies: ECHOCARDIOGRAM COMPLETE  Result Date: 09/29/2021    ECHOCARDIOGRAM REPORT   Patient Name:   Hector [name] Date of Exam: 09/29/2021 Medical Rec #:  [id number]           Height:       71.0 in  Accession #:    [id number]          Weight:       257.9 lb Date of Birth:  Dec 02, 1965           BSA:          2.349 m Patient Age:    21 years            BP:           120/105 mmHg Patient Gender: M  HR:           87 bpm. Exam Location:  [facility] Procedure: 2D Echo, Cardiac Doppler, Color Doppler and Intracardiac            Opacification Agent Indications:     CHF-Acute Systolic C58.85  History:         Patient has prior history of Echocardiogram examinations, most                  recent 03/05/2021. Cardiomyopathy and CHF, Stroke,                  Arrythmias:Atrial Fibrillation and Atrial Flutter; Risk                  Factors:Hypertension, Dyslipidemia and ETOH.  Sonographer:     Bernadene Person RDCS Referring Phys:  [place] Diagnosing Phys: Kathlyn Sacramento MD IMPRESSIONS  1. Left ventricular ejection fraction, by estimation, is <20%. The left ventricle has severely decreased function. The left ventricle has no regional wall motion abnormalities. The left ventricular internal cavity size was moderately dilated. Left ventricular diastolic parameters are indeterminate.  2. Right ventricular systolic function is mildly reduced. The right ventricular size is normal. Tricuspid regurgitation signal is inadequate for assessing PA pressure.  3. Left atrial size was moderately dilated.  4. Right atrial size was mildly dilated.  5. The mitral valve is normal in structure. Moderate mitral valve regurgitation. No evidence of mitral stenosis.  6. The aortic valve is normal in structure. Aortic valve regurgitation is not visualized. Aortic valve sclerosis/calcification is present, without any evidence of aortic stenosis.  7. The inferior vena cava is dilated in size with <50% respiratory variability, suggesting right atrial pressure of 15 mmHg. FINDINGS  Left Ventricle: Left ventricular ejection fraction, by estimation, is <20%. The left ventricle has severely decreased function. The left ventricle has no  regional wall motion abnormalities. Definity contrast agent was given IV to delineate the left ventricular endocardial borders. The left ventricular internal cavity size was moderately dilated. There is no left ventricular hypertrophy. Left ventricular diastolic parameters are indeterminate. Right Ventricle: The right ventricular size is normal. No increase in right ventricular wall thickness. Right ventricular systolic function is mildly reduced. Tricuspid regurgitation signal is inadequate for assessing PA pressure. The tricuspid regurgitant velocity is 2.14 m/s, and with an assumed right atrial pressure of 15 mmHg, the estimated right ventricular systolic pressure is 02.7 mmHg. Left Atrium: Left atrial size was moderately dilated. Right Atrium: Right atrial size was mildly dilated. Pericardium: There is no evidence of pericardial effusion. Mitral Valve: The mitral valve is normal in structure. Moderate mitral valve regurgitation. No evidence of mitral valve stenosis. Tricuspid Valve: The tricuspid valve is normal in structure. Tricuspid valve regurgitation is trivial. No evidence of tricuspid stenosis. Aortic Valve: The aortic valve is normal in structure. Aortic valve regurgitation is not visualized. Aortic valve sclerosis/calcification is present, without any evidence of aortic stenosis. Pulmonic Valve: The pulmonic valve was normal in structure. Pulmonic valve regurgitation is not visualized. No evidence of pulmonic stenosis. Aorta: The aortic root is normal in size and structure. Venous: The inferior vena cava is dilated in size with less than 50% respiratory variability, suggesting right atrial pressure of 15 mmHg. IAS/Shunts: No atrial level shunt detected by color flow Doppler.  LEFT VENTRICLE PLAX 2D LVIDd:         5.84 cm LVIDs:         5.36 cm LV PW:  1.25 cm LV IVS:        1.17 cm LVOT diam:     2.20 cm LVOT Area:     3.80 cm  LV Volumes (MOD) LV vol d, MOD A2C: 128.0 ml LV vol d, MOD A4C:  131.0 ml LV vol s, MOD A2C: 83.0 ml LV vol s, MOD A4C: 95.3 ml LV SV MOD A2C:     45.0 ml LV SV MOD A4C:     131.0 ml LV SV MOD BP:      41.5 ml RIGHT VENTRICLE TAPSE (M-mode): 1.5 cm LEFT ATRIUM             Index LA diam:        4.60 cm 1.96 cm/m LA Vol (A2C):   95.9 ml 40.82 ml/m LA Vol (A4C):   94.6 ml 40.27 ml/m LA Biplane Vol: 95.1 ml 40.48 ml/m   AORTA Ao Root diam: 3.30 cm Ao Asc diam:  3.50 cm MR Peak grad:    85.4 mmHg    TRICUSPID VALVE MR Mean grad:    55.0 mmHg    TR Peak grad:   18.3 mmHg MR Vmax:         462.00 cm/s  TR Vmax:        214.00 cm/s MR Vmean:        355.0 cm/s MR PISA:         1.01 cm     SHUNTS MR PISA Eff ROA: 8 mm        Systemic Diam: 2.20 cm MR PISA Radius:  0.40 cm Kathlyn Sacramento MD Electronically signed by Kathlyn Sacramento MD Signature Date/Time: 09/29/2021/1:06:02 PM    Final    DG Chest 2 View  Result Date: 09/28/2021 CLINICAL DATA:  Shortness of breath. EXAM: CHEST - 2 VIEW COMPARISON:  Jul 22, 2021 FINDINGS: Enlarged cardiac silhouette. Mixed pattern pulmonary edema. There is no evidence of focal airspace consolidation, pleural effusion or pneumothorax. Osseous structures are without acute abnormality. Soft tissues are grossly normal. IMPRESSION: Enlarged cardiac silhouette with mixed pattern pulmonary edema. Electronically Signed   By: Fidela Salisbury M.D.   On: 09/28/2021 15:52   NM Hepato W/EF  Result Date: 09/11/2021 CLINICAL DATA:  Right upper quadrant pain. EXAM: NUCLEAR MEDICINE HEPATOBILIARY IMAGING WITH GALLBLADDER EF TECHNIQUE: Sequential images of the abdomen were obtained out to 60 minutes following intravenous administration of radiopharmaceutical. After oral ingestion of Ensure, gallbladder ejection fraction was determined. At 60 min, normal ejection fraction is greater than 33%. RADIOPHARMACEUTICALS:  7.43 mCi Tc-13m  Choletec IV COMPARISON:  MRCP 09/10/2021 and abdominal sonogram 09/10/2021. FINDINGS: Prompt uptake and biliary excretion of activity by  the liver is seen. Gallbladder activity is visualized within the first hour, consistent with patency of cystic duct. Biliary activity passes into small bowel, consistent with patent common bile duct. Persistent intense tracer uptake is noted throughout the liver throughout the examination, which may reflect underlying hepatocyte dysfunction. Calculated gallbladder ejection fraction is 6%. (Normal gallbladder ejection fraction with Ensure is greater than 33%.) IMPRESSION: 1. Patent cystic duct without evidence for acute cholecystitis. 2. Abnormally low gallbladder ejection fraction equal to 6% indicative of decreased gallbladder emptying and gallbladder dysfunction. Electronically Signed   By: Kerby Moors M.D.   On: 09/11/2021 15:17   MR ABDOMEN MRCP WO CONTRAST  Result Date: 09/10/2021 CLINICAL DATA:  Abdominal distension and right upper quadrant abdominal pain. Cholelithiasis seen on ultrasound examination. EXAM: MRI ABDOMEN WITHOUT CONTRAST  (INCLUDING MRCP) TECHNIQUE: Multiplanar multisequence  MR imaging of the abdomen was performed. Heavily T2-weighted images of the biliary and pancreatic ducts were obtained, and three-dimensional MRCP images were rendered by post processing. COMPARISON:  CT and ultrasound examinations, same date. FINDINGS: Lower chest: The lung bases are grossly clear. No pleural or pericardial effusion. Hepatobiliary: No hepatic lesions are identified without contrast. No intrahepatic biliary dilatation. Small layering gallstones are noted the gallbladder. No gallbladder wall thickening is identified. There is a small amount of pericholecystic fluid noted. Normal caliber and course of the common bile duct. No common bile duct stones. Pancreas: No mass, inflammation or ductal dilatation. Prominent fatty interstices noted. Spleen:  Normal size.  No focal lesions. Adrenals/Urinary Tract:  The adrenal glands are normal. Bilateral renal cysts.  No hydronephrosis. Stomach/Bowel: The stomach,  duodenum, small bowel and colon are unremarkable. Vascular/Lymphatic: The aorta is normal in caliber. No mesenteric or retroperitoneal mass or adenopathy. Other:  No ascites or abdominal wall hernia. Musculoskeletal: No significant bony findings. IMPRESSION: 1. Cholelithiasis. No gallbladder wall thickening but there is small amount of pericholecystic fluid. Nuclear medicine hepatobiliary scan may be helpful to assess cystic duct patency. 2. Normal caliber and course of the common bile duct. No common bile duct stones. 3. No other acute abdominal findings, mass lesions or adenopathy. 4. Numerous bilateral renal cysts. Electronically Signed   By: Marijo Sanes M.D.   On: 09/10/2021 21:26   US ABDOMEN LIMITED RUQ (LIVER/GB)  Result Date: 09/10/2021 CLINICAL DATA:  Right upper quadrant pain over the last 2-3 weeks. EXAM: ULTRASOUND ABDOMEN LIMITED RIGHT UPPER QUADRANT COMPARISON:  CT same day FINDINGS: Gallbladder: Gallstones, the largest measuring 12 mm. Wall thickening measuring 4 mm. Tiny amount of pericholecystic fluid. No sonographic Murphy sign. None the less, the findings are worrisome for cholecystitis. Common bile duct: Diameter: 8 mm, dilated.  No visible stone. Liver: Increased echogenicity suggesting fatty change. No focal lesion. Portal vein is patent on color Doppler imaging with normal direction of blood flow towards the liver. Other: None. IMPRESSION: Chololithiasis. Largest stone 12 mm. Gallbladder wall thickening at 4 mm. Small amount of pericholecystic fluid. Findings worrisome for acute cholecystitis. Consider nuclear medicine study if concern persists. Prominent common bile duct at 8 mm diameter. No ductal stone is identified. Electronically Signed   By: Nelson Chimes M.D.   On: 09/10/2021 14:28   CT ABDOMEN PELVIS WO CONTRAST  Result Date: 09/10/2021 CLINICAL DATA:  Acute, nonlocalized abdominal pain with bowel obstruction suspected EXAM: CT ABDOMEN AND PELVIS WITHOUT CONTRAST TECHNIQUE:  Multidetector CT imaging of the abdomen and pelvis was performed following the standard protocol without IV contrast. RADIATION DOSE REDUCTION: This exam was performed according to the departmental dose-optimization program which includes automated exposure control, adjustment of the mA and/or kV according to patient size and/or use of iterative reconstruction technique. COMPARISON:  05/06/2021 FINDINGS: Lower chest: Calcified nodules in the right lower lobe as previously noted. Interlobular septal thickening at the lateral costophrenic sulci, possibly scarring given stability from prior. Hepatobiliary: No focal liver abnormality.Prominent size of the caudate lobe with surface lobulation. The umbilical vein is also conspicuous. Stranding at the hepatic hilum adjacent to the gallbladder which is only moderately distended but does contain calculi. Pancreas: Generalized fatty atrophy Spleen: Unremarkable. Adrenals/Urinary Tract: Negative adrenals. No hydronephrosis or stone. Bilateral renal cysts measuring up to 5.1 cm at the right lower pole and on the left up to 4 cm at the upper pole. Subcentimeter presumed hemorrhagic cyst in the interpolar left kidney, interval circumferential thick  walled bladder, chronic. Stomach/Bowel: No obstruction. Right upper quadrant inflammation but not associated with thickening of adjacent stomach or duodenum. Vascular/Lymphatic: No acute vascular abnormality. Atherosclerosis. No mass or adenopathy. Reproductive:No pathologic findings. Other: Trace pelvic fluid. No pneumoperitoneum. Fatty right groin and umbilical hernias. Musculoskeletal: No acute abnormalities. IMPRESSION: 1. Right upper quadrant fat stranding in close proximity to the gallbladder which contains calculi, question symptoms of cholecystitis. 2. Possible cirrhosis, please correlate for risk factors. 3. Fatty umbilical and right groin hernias 4. Atherosclerosis. Electronically Signed   By: Jorje Guild M.D.   On:  09/10/2021 12:58    Microbiology: Results for orders placed or performed during the hospital encounter of 09/10/21  Urine Culture     Status: None   Collection Time: 09/10/21  1:02 PM   Specimen: Urine, Clean Catch  Result Value Ref Range Status   Specimen Description   Final    URINE, CLEAN CATCH Performed at [facility], [address], [place], [place] [address]    Special Requests   Final    NONE Performed at [facility], [address] [place], [place] [address]    Culture   Final    NO GROWTH Performed at [facility], [place] [address], [place], [place] [address]    Report Status 09/12/2021 FINAL  Final    Labs: CBC: Recent Labs  Lab 09/28/21 1403 09/29/21 0351 09/30/21 0611  WBC 7.3 7.9 8.0  NEUTROABS 4.7  --   --   HGB 13.0 13.6 14.4  HCT 42.3 42.5 46.0  MCV 81.0 79.7* 80.0  PLT 294 246 659   Basic Metabolic Panel: Recent Labs  Lab 09/28/21 1403 09/29/21 0351 09/30/21 0611 10/01/21 0500 10/02/21 0600  NA 138 138 137 136 133*  K 4.1 3.6 3.4* 3.7 3.6  CL 105 104 101 101 98  CO2 22 22 26 26 25   GLUCOSE 105* 100* 121* 128* 127*  BUN 39* 44* 42* 44* 47*  CREATININE 3.25* 3.40* 3.37* 3.23* 3.36*  CALCIUM 8.6* 8.7* 8.3* 8.4* 8.5*  MG 2.1 2.0 2.1  --   --   PHOS  --   --  4.1  --   --    Liver Function Tests: Recent Labs  Lab 09/28/21 1403 09/29/21 0351  AST 24 21  ALT 17 15  ALKPHOS 92 100  BILITOT 3.8* 4.0*  PROT 6.4* 6.3*  ALBUMIN 3.3* 3.1*   CBG: No results for input(s): "GLUCAP" in the last 168 hours.  Discharge time spent: approximately 35 minutes spent on discharge counseling, evaluation of patient on day of discharge, and coordination of discharge planning with nursing, social work, pharmacy and case management  Signed: Edwin Dada, MD [facility] 10/02/2021

## 2021-10-02 NOTE — Progress Notes (Signed)
   Heart Failure Nurse Navigator Note  Spoke with patient today, he is being discharged home today.  Went over importance of daily weights, reporting weight changes and changes in symptoms to the Heart Failure Clinic.  He was given a scale that would weigh him in pounds.  Stresssed the importance of follow up with Ms. Hackney in the clinic on August 31 at 2 PM.  He was given written instructions.  Had no further questions.  Pricilla Riffle RN [facility]

## 2021-10-06 ENCOUNTER — Encounter: Payer: Self-pay | Admitting: *Deleted

## 2021-10-13 ENCOUNTER — Other Ambulatory Visit: Payer: Self-pay

## 2021-10-13 ENCOUNTER — Inpatient Hospital Stay
Admission: EM | Admit: 2021-10-13 | Discharge: 2021-10-20 | DRG: 291 | Disposition: A | Discharge status: Home / Self Care | Payer: Medicaid Other | Attending: Internal Medicine | Admitting: Internal Medicine

## 2021-10-13 ENCOUNTER — Emergency Department: Payer: Medicaid Other

## 2021-10-13 DIAGNOSIS — R0602 Shortness of breath: Secondary | ICD-10-CM | POA: Diagnosis not present

## 2021-10-13 DIAGNOSIS — Z79899 Other long term (current) drug therapy: Secondary | ICD-10-CM

## 2021-10-13 DIAGNOSIS — G4733 Obstructive sleep apnea (adult) (pediatric): Secondary | ICD-10-CM | POA: Diagnosis present

## 2021-10-13 DIAGNOSIS — Z8719 Personal history of other diseases of the digestive system: Secondary | ICD-10-CM

## 2021-10-13 DIAGNOSIS — Z20822 Contact with and (suspected) exposure to covid-19: Secondary | ICD-10-CM | POA: Diagnosis present

## 2021-10-13 DIAGNOSIS — E039 Hypothyroidism, unspecified: Secondary | ICD-10-CM | POA: Diagnosis present

## 2021-10-13 DIAGNOSIS — I2510 Atherosclerotic heart disease of native coronary artery without angina pectoris: Secondary | ICD-10-CM | POA: Diagnosis present

## 2021-10-13 DIAGNOSIS — Z79890 Hormone replacement therapy (postmenopausal): Secondary | ICD-10-CM

## 2021-10-13 DIAGNOSIS — Z8673 Personal history of transient ischemic attack (TIA), and cerebral infarction without residual deficits: Secondary | ICD-10-CM

## 2021-10-13 DIAGNOSIS — I420 Dilated cardiomyopathy: Secondary | ICD-10-CM | POA: Diagnosis not present

## 2021-10-13 DIAGNOSIS — I130 Hypertensive heart and chronic kidney disease with heart failure and stage 1 through stage 4 chronic kidney disease, or unspecified chronic kidney disease: Secondary | ICD-10-CM | POA: Diagnosis present

## 2021-10-13 DIAGNOSIS — K8020 Calculus of gallbladder without cholecystitis without obstruction: Secondary | ICD-10-CM | POA: Diagnosis present

## 2021-10-13 DIAGNOSIS — I4892 Unspecified atrial flutter: Secondary | ICD-10-CM | POA: Diagnosis present

## 2021-10-13 DIAGNOSIS — I5023 Acute on chronic systolic (congestive) heart failure: Secondary | ICD-10-CM | POA: Diagnosis not present

## 2021-10-13 DIAGNOSIS — Z888 Allergy status to other drugs, medicaments and biological substances status: Secondary | ICD-10-CM | POA: Diagnosis not present

## 2021-10-13 DIAGNOSIS — R0603 Acute respiratory distress: Secondary | ICD-10-CM | POA: Diagnosis present

## 2021-10-13 DIAGNOSIS — I428 Other cardiomyopathies: Secondary | ICD-10-CM

## 2021-10-13 DIAGNOSIS — E785 Hyperlipidemia, unspecified: Secondary | ICD-10-CM

## 2021-10-13 DIAGNOSIS — F1010 Alcohol abuse, uncomplicated: Secondary | ICD-10-CM | POA: Diagnosis present

## 2021-10-13 DIAGNOSIS — T454X6A Underdosing of iron and its compounds, initial encounter: Secondary | ICD-10-CM | POA: Diagnosis present

## 2021-10-13 DIAGNOSIS — E1122 Type 2 diabetes mellitus with diabetic chronic kidney disease: Secondary | ICD-10-CM | POA: Diagnosis present

## 2021-10-13 DIAGNOSIS — I483 Typical atrial flutter: Secondary | ICD-10-CM | POA: Diagnosis not present

## 2021-10-13 DIAGNOSIS — K6289 Other specified diseases of anus and rectum: Secondary | ICD-10-CM | POA: Diagnosis present

## 2021-10-13 DIAGNOSIS — J810 Acute pulmonary edema: Secondary | ICD-10-CM

## 2021-10-13 DIAGNOSIS — I340 Nonrheumatic mitral (valve) insufficiency: Secondary | ICD-10-CM | POA: Diagnosis present

## 2021-10-13 DIAGNOSIS — R0682 Tachypnea, not elsewhere classified: Secondary | ICD-10-CM

## 2021-10-13 DIAGNOSIS — E871 Hypo-osmolality and hyponatremia: Secondary | ICD-10-CM | POA: Diagnosis present

## 2021-10-13 DIAGNOSIS — R140 Abdominal distension (gaseous): Secondary | ICD-10-CM | POA: Diagnosis present

## 2021-10-13 DIAGNOSIS — T501X6A Underdosing of loop [high-ceiling] diuretics, initial encounter: Secondary | ICD-10-CM | POA: Diagnosis present

## 2021-10-13 DIAGNOSIS — R1084 Generalized abdominal pain: Secondary | ICD-10-CM | POA: Diagnosis not present

## 2021-10-13 DIAGNOSIS — E669 Obesity, unspecified: Secondary | ICD-10-CM | POA: Diagnosis not present

## 2021-10-13 DIAGNOSIS — I4819 Other persistent atrial fibrillation: Secondary | ICD-10-CM | POA: Diagnosis present

## 2021-10-13 DIAGNOSIS — Z91138 Patient's unintentional underdosing of medication regimen for other reason: Secondary | ICD-10-CM

## 2021-10-13 DIAGNOSIS — Z6834 Body mass index (BMI) 34.0-34.9, adult: Secondary | ICD-10-CM | POA: Diagnosis not present

## 2021-10-13 DIAGNOSIS — Z7901 Long term (current) use of anticoagulants: Secondary | ICD-10-CM

## 2021-10-13 DIAGNOSIS — F1091 Alcohol use, unspecified, in remission: Secondary | ICD-10-CM | POA: Diagnosis present

## 2021-10-13 DIAGNOSIS — Z833 Family history of diabetes mellitus: Secondary | ICD-10-CM

## 2021-10-13 DIAGNOSIS — Z7984 Long term (current) use of oral hypoglycemic drugs: Secondary | ICD-10-CM

## 2021-10-13 DIAGNOSIS — Z8249 Family history of ischemic heart disease and other diseases of the circulatory system: Secondary | ICD-10-CM

## 2021-10-13 DIAGNOSIS — I10 Essential (primary) hypertension: Secondary | ICD-10-CM | POA: Diagnosis not present

## 2021-10-13 DIAGNOSIS — I4891 Unspecified atrial fibrillation: Secondary | ICD-10-CM | POA: Diagnosis not present

## 2021-10-13 DIAGNOSIS — I480 Paroxysmal atrial fibrillation: Secondary | ICD-10-CM | POA: Diagnosis not present

## 2021-10-13 DIAGNOSIS — I639 Cerebral infarction, unspecified: Secondary | ICD-10-CM | POA: Diagnosis present

## 2021-10-13 DIAGNOSIS — N184 Chronic kidney disease, stage 4 (severe): Secondary | ICD-10-CM | POA: Diagnosis not present

## 2021-10-13 DIAGNOSIS — Z87891 Personal history of nicotine dependence: Secondary | ICD-10-CM

## 2021-10-13 DIAGNOSIS — I5043 Acute on chronic combined systolic (congestive) and diastolic (congestive) heart failure: Secondary | ICD-10-CM | POA: Diagnosis present

## 2021-10-13 DIAGNOSIS — Z8349 Family history of other endocrine, nutritional and metabolic diseases: Secondary | ICD-10-CM

## 2021-10-13 LAB — RESP PANEL BY RT-PCR (FLU A&B, COVID) ARPGX2
Influenza A by PCR: NEGATIVE
Influenza B by PCR: NEGATIVE
SARS Coronavirus 2 by RT PCR: NEGATIVE

## 2021-10-13 LAB — COMPREHENSIVE METABOLIC PANEL
ALT: 14 U/L (ref 0–44)
AST: 20 U/L (ref 15–41)
Albumin: 3.5 g/dL (ref 3.5–5.0)
Alkaline Phosphatase: 86 U/L (ref 38–126)
Anion gap: 11 (ref 5–15)
BUN: 72 mg/dL — ABNORMAL HIGH (ref 6–20)
CO2: 20 mmol/L — ABNORMAL LOW (ref 22–32)
Calcium: 8.7 mg/dL — ABNORMAL LOW (ref 8.9–10.3)
Chloride: 102 mmol/L (ref 98–111)
Creatinine, Ser: 3.62 mg/dL — ABNORMAL HIGH (ref 0.61–1.24)
GFR, Estimated: 19 mL/min — ABNORMAL LOW (ref >60)
Glucose, Bld: 159 mg/dL — ABNORMAL HIGH (ref 70–99)
Potassium: 3.7 mmol/L (ref 3.5–5.1)
Sodium: 133 mmol/L — ABNORMAL LOW (ref 135–145)
Total Bilirubin: 3.8 mg/dL — ABNORMAL HIGH (ref 0.3–1.2)
Total Protein: 6.7 g/dL (ref 6.5–8.1)

## 2021-10-13 LAB — CBC WITH DIFFERENTIAL/PLATELET
Abs Immature Granulocytes: 0.02 10*3/uL (ref 0.00–0.07)
Basophils Absolute: 0.1 10*3/uL (ref 0.0–0.1)
Basophils Relative: 1 %
Eosinophils Absolute: 0.1 10*3/uL (ref 0.0–0.5)
Eosinophils Relative: 2 %
HCT: 44.4 % (ref 39.0–52.0)
Hemoglobin: 13.8 g/dL (ref 13.0–17.0)
Immature Granulocytes: 0 %
Lymphocytes Relative: 22 %
Lymphs Abs: 1.8 10*3/uL (ref 0.7–4.0)
MCH: 24.9 pg — ABNORMAL LOW (ref 26.0–34.0)
MCHC: 31.1 g/dL (ref 30.0–36.0)
MCV: 80.1 fL (ref 80.0–100.0)
Monocytes Absolute: 0.8 10*3/uL (ref 0.1–1.0)
Monocytes Relative: 9 %
Neutro Abs: 5.5 10*3/uL (ref 1.7–7.7)
Neutrophils Relative %: 66 %
Platelets: 271 10*3/uL (ref 150–400)
RBC: 5.54 MIL/uL (ref 4.22–5.81)
RDW: 19.3 % — ABNORMAL HIGH (ref 11.5–15.5)
WBC: 8.4 10*3/uL (ref 4.0–10.5)
nRBC: 0 % (ref 0.0–0.2)

## 2021-10-13 LAB — TROPONIN I (HIGH SENSITIVITY)
Troponin I (High Sensitivity): 109 ng/L (ref <18)
Troponin I (High Sensitivity): 95 ng/L — ABNORMAL HIGH (ref <18)

## 2021-10-13 LAB — BRAIN NATRIURETIC PEPTIDE: B Natriuretic Peptide: 2587.7 pg/mL — ABNORMAL HIGH (ref 0.0–100.0)

## 2021-10-13 MED ORDER — FUROSEMIDE 10 MG/ML IJ SOLN
60.0000 mg | Freq: Two times a day (BID) | INTRAMUSCULAR | Status: DC
  Administered 2021-10-14: 60 mg via INTRAVENOUS
  Filled 2021-10-13: qty 6

## 2021-10-13 MED ORDER — LEVOTHYROXINE SODIUM 100 MCG PO TABS
100.0000 ug | ORAL_TABLET | Freq: Every day | ORAL | Status: DC
Start: 2021-10-14 — End: 2021-10-20
  Administered 2021-10-14 – 2021-10-20 (×7): 100 ug via ORAL
  Filled 2021-10-13 (×7): qty 1

## 2021-10-13 MED ORDER — APIXABAN 5 MG PO TABS
5.0000 mg | ORAL_TABLET | Freq: Two times a day (BID) | ORAL | Status: DC
  Administered 2021-10-13 – 2021-10-20 (×14): 5 mg via ORAL
  Filled 2021-10-13 (×14): qty 1

## 2021-10-13 MED ORDER — HYDRALAZINE HCL 25 MG PO TABS
25.0000 mg | ORAL_TABLET | Freq: Three times a day (TID) | ORAL | Status: DC
  Administered 2021-10-13 – 2021-10-19 (×13): 25 mg via ORAL
  Filled 2021-10-13 (×20): qty 1

## 2021-10-13 MED ORDER — DAPAGLIFLOZIN PROPANEDIOL 10 MG PO TABS
10.0000 mg | ORAL_TABLET | Freq: Every day | ORAL | Status: DC
  Administered 2021-10-14 – 2021-10-20 (×6): 10 mg via ORAL
  Filled 2021-10-13 (×7): qty 1

## 2021-10-13 MED ORDER — MORPHINE SULFATE (PF) 2 MG/ML IV SOLN
2.0000 mg | Freq: Once | INTRAVENOUS | Status: AC
  Administered 2021-10-13: 2 mg via INTRAVENOUS
  Filled 2021-10-13: qty 1

## 2021-10-13 MED ORDER — FUROSEMIDE 10 MG/ML IJ SOLN
80.0000 mg | Freq: Once | INTRAMUSCULAR | Status: AC
  Administered 2021-10-13: 80 mg via INTRAVENOUS
  Filled 2021-10-13: qty 8

## 2021-10-13 MED ORDER — ACETAMINOPHEN 650 MG RE SUPP: 650.0000 mg | Freq: Four times a day (QID) | RECTAL | Status: DC | PRN

## 2021-10-13 MED ORDER — OXYCODONE-ACETAMINOPHEN 5-325 MG PO TABS
1.0000 | ORAL_TABLET | Freq: Once | ORAL | Status: AC
  Administered 2021-10-13: 1 via ORAL
  Filled 2021-10-13: qty 1

## 2021-10-13 MED ORDER — MAGNESIUM HYDROXIDE 400 MG/5ML PO SUSP: 30.0000 mL | Freq: Every day | ORAL | Status: DC | PRN

## 2021-10-13 MED ORDER — ONDANSETRON HCL 4 MG/2ML IJ SOLN
4.0000 mg | Freq: Four times a day (QID) | INTRAMUSCULAR | Status: DC | PRN
  Administered 2021-10-14: 4 mg via INTRAVENOUS
  Filled 2021-10-13: qty 2

## 2021-10-13 MED ORDER — ONDANSETRON HCL 4 MG PO TABS
4.0000 mg | ORAL_TABLET | Freq: Four times a day (QID) | ORAL | Status: DC | PRN
  Filled 2021-10-13: qty 1

## 2021-10-13 MED ORDER — ACETAMINOPHEN 325 MG PO TABS
650.0000 mg | ORAL_TABLET | Freq: Four times a day (QID) | ORAL | Status: DC | PRN
  Administered 2021-10-20: 650 mg via ORAL
  Filled 2021-10-13: qty 2

## 2021-10-13 MED ORDER — DILTIAZEM HCL 25 MG/5ML IV SOLN
5.0000 mg | Freq: Once | INTRAVENOUS | Status: AC
  Administered 2021-10-13: 5 mg via INTRAVENOUS
  Filled 2021-10-13: qty 5

## 2021-10-13 MED ORDER — TRAZODONE HCL 50 MG PO TABS
25.0000 mg | ORAL_TABLET | Freq: Every evening | ORAL | Status: DC | PRN
  Administered 2021-10-13 – 2021-10-19 (×6): 25 mg via ORAL
  Filled 2021-10-13 (×6): qty 1

## 2021-10-13 MED ORDER — ATORVASTATIN CALCIUM 80 MG PO TABS
80.0000 mg | ORAL_TABLET | Freq: Every day | ORAL | Status: DC
  Administered 2021-10-13 – 2021-10-20 (×8): 80 mg via ORAL
  Filled 2021-10-13 (×8): qty 1

## 2021-10-13 MED ORDER — ISOSORBIDE DINITRATE 10 MG PO TABS
5.0000 mg | ORAL_TABLET | Freq: Two times a day (BID) | ORAL | Status: DC
Start: 2021-10-13 — End: 2021-10-15
  Administered 2021-10-13 – 2021-10-14 (×3): 5 mg via ORAL
  Filled 2021-10-13 (×4): qty 0.5

## 2021-10-13 MED ORDER — CARVEDILOL 6.25 MG PO TABS
6.2500 mg | ORAL_TABLET | Freq: Two times a day (BID) | ORAL | Status: DC
  Administered 2021-10-13 – 2021-10-20 (×13): 6.25 mg via ORAL
  Filled 2021-10-13 (×15): qty 1

## 2021-10-13 MED ORDER — ALLOPURINOL 100 MG PO TABS
50.0000 mg | ORAL_TABLET | Freq: Every day | ORAL | Status: DC
  Administered 2021-10-14 – 2021-10-20 (×7): 50 mg via ORAL
  Filled 2021-10-13 (×7): qty 1

## 2021-10-13 NOTE — ED Provider Notes (Signed)
[facility] Provider Note   Event Date/Time   First MD Initiated Contact with Patient 10/13/21 1458     (approximate) History  Shortness of Breath  HPI Hector [name] is a 56 y.o. male with a stated past medical history of CHF and admitted noncompliance with his medications except for his fluid pill (torsemide) who presents complaining of worsening shortness of breath, bilateral lower extremity edema, and abdominal pain/distention.  Patient states worsening shortness of breath with exertion that is partially relieved at rest. ROS: Patient currently denies any vision changes, tinnitus, difficulty speaking, facial droop, sore throat, chest pain, shortness of breath, abdominal pain, nausea/vomiting/diarrhea, dysuria, or weakness/numbness/paresthesias in any extremity   Physical Exam  Triage Vital Signs: ED Triage Vitals  Enc Vitals Group     BP 10/13/21 1445 (!) 137/106     Pulse Rate 10/13/21 1445 83     Resp 10/13/21 1445 (!) 22     Temp 10/13/21 1445 98 F (36.7 C)     Temp Source 10/13/21 1445 Oral     SpO2 10/13/21 1445 95 %     Weight --      Height --      Head Circumference --      Peak Flow --      Pain Score 10/13/21 1447 0     Pain Loc --      Pain Edu? --      Excl. in [place]? --    Most recent vital signs: Vitals:   10/13/21 1630 10/13/21 1701  BP: (!) 147/118 (!) 120/105  Pulse: (!) 106 (!) 126  Resp: 13 (!) 24  Temp:    SpO2: 97% 97%   General: Awake, oriented x4. CV:  Good peripheral perfusion.  Resp:  Increased effort.  Tachypnea.  Rales over bilateral lung fields Abd:  No distention.  Other:  Middle-aged overweight African-American male laying in bed in mild respiratory distress ED Results / Procedures / Treatments  Labs (all labs ordered are listed, but only abnormal results are displayed) Labs Reviewed  BRAIN NATRIURETIC PEPTIDE - Abnormal; Notable for the following components:      Result Value   B Natriuretic Peptide  2,587.7 (*)    All other components within normal limits  COMPREHENSIVE METABOLIC PANEL - Abnormal; Notable for the following components:   Sodium 133 (*)    CO2 20 (*)    Glucose, Bld 159 (*)    BUN 72 (*)    Creatinine, Ser 3.62 (*)    Calcium 8.7 (*)    Total Bilirubin 3.8 (*)    GFR, Estimated 19 (*)    All other components within normal limits  CBC WITH DIFFERENTIAL/PLATELET - Abnormal; Notable for the following components:   MCH 24.9 (*)    RDW 19.3 (*)    All other components within normal limits  TROPONIN I (HIGH SENSITIVITY) - Abnormal; Notable for the following components:   Troponin I (High Sensitivity) 109 (*)    All other components within normal limits  RESP PANEL BY RT-PCR (FLU A&B, COVID) [id number]  TROPONIN I (HIGH SENSITIVITY)   EKG ED ECG REPORT I, Naaman Plummer, the attending physician, personally viewed and interpreted this ECG. Date: 10/13/2021 EKG Time: 1600 Rate: 126 Rhythm: Atrial fibrillation QRS Axis: normal Intervals: normal ST/T Wave abnormalities: normal Narrative Interpretation: no evidence of acute ischemia RADIOLOGY ED MD interpretation: Single view portable chest x-ray interpreted by me and shows cardiomegaly with vascular congestion -Agree with  radiology assessment Official radiology report(s): DG Chest Port 1 View  Result Date: 10/13/2021 CLINICAL DATA:  Shortness of breath EXAM: PORTABLE CHEST 1 VIEW COMPARISON:  09/28/2021, 02/26/2021 FINDINGS: Cardiomegaly with vascular congestion. No consolidation, pleural effusion, or pneumothorax. IMPRESSION: Cardiomegaly with vascular congestion. Electronically Signed   By: Donavan Foil M.D.   On: 10/13/2021 15:29   PROCEDURES: Critical Care performed: Yes, see critical care procedure note(s) .1-3 Lead EKG Interpretation  Performed by: Naaman Plummer, MD Authorized by: Naaman Plummer, MD     Interpretation: abnormal     ECG rate:  111   ECG rate assessment: tachycardic     Rhythm: atrial  fibrillation     Ectopy: none     Conduction: normal   CRITICAL CARE Performed by: Naaman Plummer  Total critical care time: 31 minutes  Critical care time was exclusive of separately billable procedures and treating other patients.  Critical care was necessary to treat or prevent imminent or life-threatening deterioration.  Critical care was time spent personally by me on the following activities: development of treatment plan with patient and/or surrogate as well as nursing, discussions with consultants, evaluation of patient's response to treatment, examination of patient, obtaining history from patient or surrogate, ordering and performing treatments and interventions, ordering and review of laboratory studies, ordering and review of radiographic studies, pulse oximetry and re-evaluation of patient's condition.  MEDICATIONS ORDERED IN ED: Medications  morphine (PF) 2 MG/ML injection 2 mg (2 mg Intravenous Given 10/13/21 1557)  furosemide (LASIX) injection 80 mg (80 mg Intravenous Given 10/13/21 1705)   IMPRESSION / MDM / ASSESSMENT AND PLAN / ED COURSE  I reviewed the triage vital signs and the nursing notes.                             The patient is on the cardiac monitor to evaluate for evidence of arrhythmia and/or significant heart rate changes. Patient's presentation is most consistent with acute presentation with potential threat to life or bodily function. Endorses dyspnea, endorses LE edema Admits Non adherence to medication regimen  Workup: ECG, CBC, BMP, Troponin, BNP, CXR Findings: EKG: No STEMI and no evidence of Brugadas sign, delta wave, epsilon wave, significantly prolonged QTc, or malignant arrhythmia. BNP: 2587 CXR: Single view portable chest x-ray shows cardiomegaly with vascular congestion concerning for edema Based on history, exam and findings, presentation most consistent with acute on chronic heart failure. Low suspicion for PNA, ACS, tamponade, aortic  dissection. Interventions: Oxygen, Diuresis  Reassessment: Symptoms improved in ED with oxygen and diuresis  Disposition (Stable but not significantly improved): Admit to medicine for further monitoring and for improvement of medication regimen to control symptoms.    FINAL CLINICAL IMPRESSION(S) / ED DIAGNOSES   Final diagnoses:  Acute on chronic systolic CHF (congestive heart failure) (HCC)  Acute pulmonary edema (HCC)  Shortness of breath  Tachypnea  Respiratory distress   Rx / DC Orders   ED Discharge Orders     None      Note:  This document was prepared using Dragon voice recognition software and may include unintentional dictation errors.   Naaman Plummer, MD 10/13/21 [phone]

## 2021-10-13 NOTE — Assessment & Plan Note (Signed)
-   We will continue statin therapy. 

## 2021-10-13 NOTE — Assessment & Plan Note (Signed)
-   We will continue his antihypertensives. 

## 2021-10-13 NOTE — ED Triage Notes (Signed)
SOB since Friday/Saturday. Pt states he is taking torsemide but feels like there is an increase of fluid retention

## 2021-10-13 NOTE — H&P (Addendum)
[place]   PATIENT NAME: Hector [name]    MR#:  [id number]  DATE OF BIRTH:  1965-12-01  DATE OF ADMISSION:  10/13/2021  PRIMARY CARE PHYSICIAN: [name], MD   Patient is coming from: Home  REQUESTING/REFERRING PHYSICIAN: Valora Piccolo, MD  CHIEF COMPLAINT:   Chief Complaint  Patient presents with   Shortness of Breath    HISTORY OF PRESENT ILLNESS:  Hector [name] is a 56 y.o. male with medical history significant for combined systolic and diastolic CHF, stage III chronic kidney disease, essential hypertension, dyslipidemia, paroxysmal atrial fibrillation/flutter and CVA, who presented to the emergency room with acute onset of worsening dyspnea with associated orthopnea and paroxysmal nocturnal dyspnea over the last few days as well as abdominal distention and mild lower extremity edema.  He denied any cough or wheezing.  No chest pain or palpitations.  No fever or chills.  No dysuria, oliguria or hematuria or flank pain.  ED Course: When he came to the ER BP was 137/106 and later 116/92, heart rate of 116 and respiratory rate 22 and later 29 with pulse oximetry of 95% on room air.  Labs revealed mild hyponatremia 133 CO2 of 20 with glucose of 159, BUN of 72 and a creatinine of 3.62 compared to 47/3.36 on 10/02/2021, calcium 8.7.  High-sensitivity troponin I was 109 and later 95.  CBC showed no significant abnormalities.  Influenza antigens and COVID-19 PCR came back negative. EKG as reviewed by me : EKG showed atrial fibrillation with rapid ventricular sponsor of 126 with nonspecific intraventricular conduction delay, poor R wave progression, and Q waves laterally.. Imaging: Portable chest x-ray showed cardiomegaly with vascular congestion.  The patient was given 2 mg of IV morphine sulfate and 80 mg of IV Lasix.  He will be admitted to a progressive unit bed for further evaluation and management. 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 12/2007 MV: EF 28%, no isch;  b. 8/12 Echo: EF 25-35%; c. 02/2014 Echo: EF 20-25%; d. 12/2014 Cath: minimal CAD; e. 01/2015 Echo: EF 50-55%;  d. 05/2016 Echo: EF 30-35%, diff HK, gr2 DD; e. 11/2016 Echo: EF 45-50%, diff HK.   Chronic combined systolic (congestive) and diastolic (congestive) heart failure ([place])    a. 05/2016 Most recent Echo: EF 30-35%, diff HK, Gr2 DD, mild MR, mod dil LA; b. 11/2016 Echo: EF 45-50%, diff HK.   CKD (chronic kidney disease), stage III (HCC)    Elevated troponin    Chronically elevated. - level was 0.17-0.10 during recent admission   Essential hypertension    GI bleed 11/2013   Hyperlipidemia    Pancreatitis    Paroxysmal A-fib (HCC)    a. new onset s/p unsuccessful TEE/DCCV on 08/16/2014; b. on amio/eliquis (CHA2DS2VASc = 2-3); c. reports 1-2 hrs of afib ~ q73mos.   Paroxysmal atrial flutter ([place])    a. new onset 07/2014; b. s/p unsuccessful TEE/DCCV 08/16/2014; c. on apixaban.   Sleep-disordered breathing    Has yet to have a sleep study   Stroke [facility])     PAST SURGICAL HISTORY:   Past Surgical History:  Procedure Laterality Date   CARDIAC CATHETERIZATION N/A 01/09/2015   Procedure: Left Heart Cath and Coronary Angiography;  Surgeon: [name], MD;  Location: [place] [facility];  Service: Cardiovascular;  Laterality: N/A;   CARDIOVERSION N/A 03/05/2021  Procedure: CARDIOVERSION;  Surgeon: Kate Sable, MD;  Location: [facility] ORS;  Service: Cardiovascular;  Laterality: N/A;   COLONOSCOPY N/A 07/22/2020   Procedure: COLONOSCOPY;  Surgeon: Toledo, Benay Pike, MD;  Location: [facility] ENDOSCOPY;  Service: Gastroenterology;  Laterality: N/A;   ELECTROPHYSIOLOGIC STUDY N/A 08/16/2014   Procedure: CARDIOVERSION;  Surgeon: [name], MD;  Location: [facility] ORS;  Service: Cardiovascular;  Laterality: N/A;   FLEXIBLE SIGMOIDOSCOPY N/A 10/10/2015   Procedure: FLEXIBLE SIGMOIDOSCOPY;  Surgeon:  [name], MD;  Location[facility] ENDOSCOPY;  Service: Endoscopy;  Laterality: N/A;   KNEE SURGERY Right    NM MYOVIEW LTD  November 2011   No ischemia or infarction. EF 50-55% ( no improvement from 2009 Myoview EF of 28%   TEE WITHOUT CARDIOVERSION N/A 08/16/2014   Procedure: TRANSESOPHAGEAL ECHOCARDIOGRAM (TEE);  Surgeon: [name], MD;  Location: [facility] ORS;  Service: Cardiovascular;  Laterality: N/A;   TEE WITHOUT CARDIOVERSION N/A 03/05/2021   Procedure: TRANSESOPHAGEAL ECHOCARDIOGRAM (TEE);  Surgeon: Kate Sable, MD;  Location: [facility] ORS;  Service: Cardiovascular;  Laterality: N/A;    SOCIAL HISTORY:   Social History   Tobacco Use   Smoking status: Former    Packs/day: 1.00    Years: 12.00    Total pack years: 12.00    Types: Cigarettes    Quit date: 02/23/1998    Years since quitting: 23.6   Smokeless tobacco: Never  Substance Use Topics   Alcohol use: Not Currently    Alcohol/week: 0.0 standard drinks of alcohol    Comment: Past heavy drinker    FAMILY HISTORY:   Family History  Problem Relation Age of Onset   Hypertension Mother    Hyperlipidemia Mother    Diabetes Mother     DRUG ALLERGIES:   Allergies  Allergen Reactions   Esomeprazole Magnesium Other (See Comments)    Suspected interstitial nephritis 2018   Eggs Or Egg-Derived Products Rash    REVIEW OF SYSTEMS:   ROS As per history of present illness. All pertinent systems were reviewed above. Constitutional, HEENT, cardiovascular, respiratory, GI, GU, musculoskeletal, neuro, psychiatric, endocrine, integumentary and hematologic systems were reviewed and are otherwise negative/unremarkable except for positive findings mentioned above in the HPI.   MEDICATIONS AT HOME:   Prior to Admission medications   Medication Sig Start Date End Date Taking? Authorizing Provider  torsemide 40 MG TABS Take 40 mg by mouth daily. 10/03/21  Yes Danford, Suann Larry, MD  allopurinol (ZYLOPRIM) 100 MG  tablet Take 0.5 tablets (50 mg total) by mouth daily. 10/03/21   Danford, Suann Larry, MD  apixaban (ELIQUIS) 5 MG TABS tablet Take 1 tablet (5 mg total) by mouth 2 (two) times daily. 10/02/21   Danford, Suann Larry, MD  atorvastatin (LIPITOR) 80 MG tablet Take 1 tablet (80 mg total) by mouth daily. 05/13/21   [name], MD  carvedilol (COREG) 6.25 MG tablet Take 1 tablet (6.25 mg total) by mouth 2 (two) times daily with a meal. 10/02/21   Danford, Suann Larry, MD  dapagliflozin propanediol (FARXIGA) 10 MG TABS tablet Take 1 tablet (10 mg total) by mouth daily before breakfast. 10/02/21   Danford, Suann Larry, MD  ferrous gluconate (FERGON) 324 MG tablet Take 324 mg by mouth daily with breakfast. Patient not taking: Reported on 10/13/2021    [provider]  hydrALAZINE (APRESOLINE) 25 MG tablet Take 1 tablet (25 mg total) by mouth 3 (three) times daily. 10/02/21   Danford, Suann Larry, MD  isosorbide dinitrate (  ISORDIL) 5 MG tablet Take 1 tablet (5 mg total) by mouth 2 (two) times daily. 10/02/21   Danford, Suann Larry, MD  levothyroxine (SYNTHROID) 100 MCG tablet Take 100 mcg by mouth daily before breakfast.    [provider]  torsemide (DEMADEX) 20 MG tablet Take 40 mg by mouth daily. Patient not taking: Reported on 10/13/2021 10/02/21   [provider]      VITAL SIGNS:  Blood pressure (!) 116/92, pulse (!) 115, temperature 98.1 F (36.7 C), temperature source Oral, resp. rate 20, height 5\' 11"  (1.803 m), weight 111.4 kg, SpO2 98 %.  PHYSICAL EXAMINATION:  Physical Exam  GENERAL:  56 y.o.-year-old African-American male patient sitting in bed in mild respiratory distress with conversational dyspnea. EYES: Pupils equal, round, reactive to light and accommodation. No scleral icterus. Extraocular muscles intact.  HEENT: Head atraumatic, normocephalic. Oropharynx and nasopharynx clear.  NECK:  Supple, no jugular venous distention. No thyroid enlargement,  no tenderness.  LUNGS: Diminished bibasilar breath sounds with bibasilar rales.. No use of accessory muscles of respiration.  CARDIOVASCULAR: Regular rate and rhythm, S1, S2 normal. No murmurs, rubs, or gallops.  ABDOMEN: Soft, nondistended, nontender. Bowel sounds present. No organomegaly or mass.  EXTREMITIES: Trace bilateral lower extremity pitting edema, with no cyanosis, or clubbing.  NEUROLOGIC: Cranial nerves II through XII are intact. Muscle strength 5/5 in all extremities. Sensation intact. Gait not checked.  PSYCHIATRIC: The patient is alert and oriented x 3.  Normal affect and good eye contact. SKIN: No obvious rash, lesion, or ulcer.   LABORATORY PANEL:   CBC Recent Labs  Lab 10/13/21 1550  WBC 8.4  HGB 13.8  HCT 44.4  PLT 271   ------------------------------------------------------------------------------------------------------------------  Chemistries  Recent Labs  Lab 10/13/21 1550  NA 133*  K 3.7  CL 102  CO2 20*  GLUCOSE 159*  BUN 72*  CREATININE 3.62*  CALCIUM 8.7*  AST 20  ALT 14  ALKPHOS 86  BILITOT 3.8*   ------------------------------------------------------------------------------------------------------------------  Cardiac Enzymes No results for input(s): "TROPONINI" in the last 168 hours. ------------------------------------------------------------------------------------------------------------------  RADIOLOGY:  DG Chest Port 1 View  Result Date: 10/13/2021 CLINICAL DATA:  Shortness of breath EXAM: PORTABLE CHEST 1 VIEW COMPARISON:  09/28/2021, 02/26/2021 FINDINGS: Cardiomegaly with vascular congestion. No consolidation, pleural effusion, or pneumothorax. IMPRESSION: Cardiomegaly with vascular congestion. Electronically Signed   By: Donavan Foil M.D.   On: 10/13/2021 15:29      IMPRESSION AND PLAN:  Assessment and Plan: * Acute on chronic systolic (congestive) heart failure ([place]) - The patient be admitted to a progressive unit  bed. - We will continue diuresis with IV Lasix. - We will continue [place] and Coreg as well as Isordil. - We will follow serial troponins. - His most recent 2D echo on 8/7 showed an EF of less than 25% and diastolic function was indeterminate.  It showed mild right atrial dilatation and moderate left atrial dilatation with moderate mitral valve regurgitation. - Cardiology consult will be obtained. - I notified Dr. Garen Lah about the patient.  Atrial fibrillation with rapid ventricular response (HCC) - We will continue Coreg and add 5 mg of IV Cardizem. - We will continue Eliquis.  Hypothyroidism - We will continue Synthroid.  Dyslipidemia We will continue statin therapy.  Essential hypertension - We will continue his antihypertensives.   DVT prophylaxis: Eliquis. Advanced Care Planning:  Code Status: Partial code/DNI.  This was discussed with him.  Family Communication:  The plan of care was discussed in details with  the patient (and family). I answered all questions. The patient agreed to proceed with the above mentioned plan. Further management will depend upon hospital course. Disposition Plan: Back to previous home environment Consults called: Cardiology. All the records are reviewed and case discussed with ED provider.  Status is: Inpatient   At the time of the admission, it appears that the appropriate admission status for this patient is inpatient.  This is judged to be reasonable and necessary in order to provide the required intensity of service to ensure the patient's safety given the presenting symptoms, physical exam findings and initial radiographic and laboratory data in the context of comorbid conditions.  The patient requires inpatient status due to high intensity of service, high risk of further deterioration and high frequency of surveillance required.  I certify that at the time of admission, it is my clinical judgment that the patient will require inpatient  hospital care extending more than 2 midnights.                            Dispo: The patient is from: Home              Anticipated d/c is to: Home              Patient currently is not medically stable to d/c.              Difficult to place patient: No  Christel Mormon M.D on 10/13/2021 at 8:14 PM  [facility]   From 7 PM-7 AM, contact night-coverage [email or web address]  CC: Primary care physician; [name], MD

## 2021-10-13 NOTE — Assessment & Plan Note (Addendum)
-   We will continue Coreg and add 5 mg of IV Cardizem. - We will continue Eliquis.

## 2021-10-13 NOTE — Assessment & Plan Note (Signed)
-   We will continue Synthroid. 

## 2021-10-13 NOTE — Progress Notes (Signed)
       CROSS COVER NOTE  NAME: Hector [name] MRN: [id number] DOB : January 01, 1966    Date of Service   10/13/21  HPI/Events of Note   Medication request received for patient report of 8/10 abdominal pain/tenderness secondary to fluid retention and taut skin.  Interventions   Plan: Oxycodone-Acetaminophen 5-325 mg     This document was prepared using Dragon voice recognition software and may include unintentional dictation errors.  Neomia Glass DNP, MHA, FNP-BC Nurse Practitioner [facility] Pager [phone]

## 2021-10-13 NOTE — ED Notes (Signed)
Bradler MD notified that troponin is 109 at this time. Lab notified this RN @ [phone]

## 2021-10-13 NOTE — Assessment & Plan Note (Addendum)
-   The patient be admitted to a progressive unit bed. - We will continue diuresis with IV Lasix. - We will continue [place] and Coreg as well as Isordil. - We will follow serial troponins. - His most recent 2D echo on 8/7 showed an EF of less than 94% and diastolic function was indeterminate.  It showed mild right atrial dilatation and moderate left atrial dilatation with moderate mitral valve regurgitation. - Cardiology consult will be obtained. - I notified Dr. Garen Lah about the patient.

## 2021-10-14 ENCOUNTER — Ambulatory Visit: Payer: Medicaid Other | Admitting: Medical

## 2021-10-14 ENCOUNTER — Inpatient Hospital Stay: Payer: Medicaid Other

## 2021-10-14 ENCOUNTER — Encounter: Payer: Self-pay | Admitting: Family Medicine

## 2021-10-14 DIAGNOSIS — I480 Paroxysmal atrial fibrillation: Secondary | ICD-10-CM

## 2021-10-14 DIAGNOSIS — N184 Chronic kidney disease, stage 4 (severe): Secondary | ICD-10-CM | POA: Diagnosis not present

## 2021-10-14 DIAGNOSIS — I5023 Acute on chronic systolic (congestive) heart failure: Secondary | ICD-10-CM | POA: Diagnosis not present

## 2021-10-14 LAB — BASIC METABOLIC PANEL
Anion gap: 11 (ref 5–15)
BUN: 66 mg/dL — ABNORMAL HIGH (ref 6–20)
CO2: 21 mmol/L — ABNORMAL LOW (ref 22–32)
Calcium: 8.1 mg/dL — ABNORMAL LOW (ref 8.9–10.3)
Chloride: 102 mmol/L (ref 98–111)
Creatinine, Ser: 3.44 mg/dL — ABNORMAL HIGH (ref 0.61–1.24)
GFR, Estimated: 20 mL/min — ABNORMAL LOW (ref >60)
Glucose, Bld: 143 mg/dL — ABNORMAL HIGH (ref 70–99)
Potassium: 3.5 mmol/L (ref 3.5–5.1)
Sodium: 134 mmol/L — ABNORMAL LOW (ref 135–145)

## 2021-10-14 LAB — CBC
HCT: 41.5 % (ref 39.0–52.0)
Hemoglobin: 13 g/dL (ref 13.0–17.0)
MCH: 24.9 pg — ABNORMAL LOW (ref 26.0–34.0)
MCHC: 31.3 g/dL (ref 30.0–36.0)
MCV: 79.5 fL — ABNORMAL LOW (ref 80.0–100.0)
Platelets: 217 10*3/uL (ref 150–400)
RBC: 5.22 MIL/uL (ref 4.22–5.81)
RDW: 18.8 % — ABNORMAL HIGH (ref 11.5–15.5)
WBC: 10.3 10*3/uL (ref 4.0–10.5)
nRBC: 0 % (ref 0.0–0.2)

## 2021-10-14 LAB — TSH: TSH: 3.714 u[IU]/mL (ref 0.350–4.500)

## 2021-10-14 MED ORDER — FUROSEMIDE 10 MG/ML IJ SOLN
80.0000 mg | Freq: Two times a day (BID) | INTRAMUSCULAR | Status: DC
  Administered 2021-10-14 – 2021-10-17 (×6): 80 mg via INTRAVENOUS
  Filled 2021-10-14 (×6): qty 8

## 2021-10-14 MED ORDER — IOHEXOL 9 MG/ML PO SOLN
500.0000 mL | ORAL | Status: AC
  Administered 2021-10-14 (×2): 500 mL via ORAL

## 2021-10-14 NOTE — Progress Notes (Signed)
   Heart Failure Nurse Navigator Note  Attempted to meet with patient today, he was lying on his side with his eyes closed, called his name, he did open his eyes but asked that I come back at another time as he was wanting to sleep.  Pricilla Riffle RN [facility]

## 2021-10-14 NOTE — Progress Notes (Deleted)
Cardiology Office Note:    Date:  10/14/2021   ID:  [name], DOB 1965-11-24, MRN [id number]  PCP:  [name], MD  [facility] Cardiologist:  Ida Rogue, MD  [place] Electrophysiologist:  None   Referring MD: [name], MD   Chief Complaint: 1 month follow-up  History of Present Illness:    Hector [name] is a 56 y.o. 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 12/2007 MV: EF 28%, no isch;  b. 8/12 Echo: EF 25-35%; c. 02/2014 Echo: EF 20-25%; d. 12/2014 Cath: minimal CAD; e. 01/2015 Echo: EF 50-55%;  d. 05/2016 Echo: EF 30-35%, diff HK, gr2 DD; e. 11/2016 Echo: EF 45-50%, diff HK.   Chronic combined systolic (congestive) and diastolic (congestive) heart failure ([place])    a. 05/2016 Most recent Echo: EF 30-35%, diff HK, Gr2 DD, mild MR, mod dil LA; b. 11/2016 Echo: EF 45-50%, diff HK.   CKD (chronic kidney disease), stage III (HCC)    Elevated troponin    Chronically elevated. - level was 0.17-0.10 during recent admission   Essential hypertension    GI bleed 11/2013   Hyperlipidemia    Pancreatitis    Paroxysmal A-fib (HCC)    a. new onset s/p unsuccessful TEE/DCCV on 08/16/2014; b. on amio/eliquis (CHA2DS2VASc = 2-3); c. reports 1-2 hrs of afib ~ q82mos.   Paroxysmal atrial flutter ([place])    a. new onset 07/2014; b. s/p unsuccessful TEE/DCCV 08/16/2014; c. on apixaban.   Sleep-disordered breathing    Has yet to have a sleep study   Stroke [facility])     Past Surgical History:  Procedure Laterality Date   CARDIAC CATHETERIZATION N/A 01/09/2015   Procedure: Left Heart Cath and Coronary Angiography;  Surgeon: [name], MD;  Location: [place] CV LAB;  Service: Cardiovascular;  Laterality: N/A;   CARDIOVERSION N/A 03/05/2021   Procedure: CARDIOVERSION;  Surgeon: Kate Sable, MD;  Location: [facility] ORS;  Service: Cardiovascular;  Laterality: N/A;    COLONOSCOPY N/A 07/22/2020   Procedure: COLONOSCOPY;  Surgeon: Toledo, Benay Pike, MD;  Location: [facility] ENDOSCOPY;  Service: Gastroenterology;  Laterality: N/A;   ELECTROPHYSIOLOGIC STUDY N/A 08/16/2014   Procedure: CARDIOVERSION;  Surgeon: [name], MD;  Location: [facility] ORS;  Service: Cardiovascular;  Laterality: N/A;   FLEXIBLE SIGMOIDOSCOPY N/A 10/10/2015   Procedure: FLEXIBLE SIGMOIDOSCOPY;  Surgeon: [name], MD;  Location: [facility] ENDOSCOPY;  Service: Endoscopy;  Laterality: N/A;   KNEE SURGERY Right    NM MYOVIEW LTD  November 2011   No ischemia or infarction. EF 50-55% ( no improvement from 2009 Myoview EF of 28%   TEE WITHOUT CARDIOVERSION N/A 08/16/2014   Procedure: TRANSESOPHAGEAL ECHOCARDIOGRAM (TEE);  Surgeon: [name], MD;  Location: [facility] ORS;  Service: Cardiovascular;  Laterality: N/A;   TEE WITHOUT CARDIOVERSION N/A 03/05/2021   Procedure: TRANSESOPHAGEAL ECHOCARDIOGRAM (TEE);  Surgeon: Kate Sable, MD;  Location: [facility] ORS;  Service: Cardiovascular;  Laterality: N/A;    Current Medications: No outpatient medications have been marked as taking for the 10/14/21 encounter (Appointment) with Kathlen Mody, Keasia Dubose H, PA-C.     Allergies:   Esomeprazole magnesium and Eggs or egg-derived products   Social History   Socioeconomic History   Marital status: Single    Spouse name: Not on file   Number of children: Not on file   Years of education: Not  on file   Highest education level: Not on file  Occupational History   Not on file  Tobacco Use   Smoking status: Former    Packs/day: 1.00    Years: 12.00    Total pack years: 12.00    Types: Cigarettes    Quit date: 02/23/1998    Years since quitting: 23.6   Smokeless tobacco: Never  Vaping Use   Vaping Use: Never used  Substance and Sexual Activity   Alcohol use: Not Currently    Alcohol/week: 0.0 standard drinks of alcohol    Comment: Past heavy drinker   Drug use: No   Sexual activity: Not Currently   Other Topics Concern   Not on file  Social History Narrative   Not on file   Social Determinants of Health   Financial Resource Strain: Low Risk  (01/04/2019)   Overall Financial Resource Strain (CARDIA)    Difficulty of Paying Living Expenses: Not hard at all  Food Insecurity: No Food Insecurity (01/04/2019)   Hunger Vital Sign    Worried About Running Out of Food in the Last Year: Never true    Ran Out of Food in the Last Year: Never true  Transportation Needs: No Transportation Needs (01/04/2019)   PRAPARE - Hydrologist (Medical): No    Lack of Transportation (Non-Medical): No  Physical Activity: Inactive (01/04/2019)   Exercise Vital Sign    Days of Exercise per Week: 0 days    Minutes of Exercise per Session: 0 min  Stress: No Stress Concern Present (01/04/2019)   [place]    Feeling of Stress : Not at all  Social Connections: Moderately Isolated (01/04/2019)   Social Connection and Isolation Panel [NHANES]    Frequency of Communication with Friends and Family: More than three times a week    Frequency of Social Gatherings with Friends and Family: More than three times a week    Attends Religious Services: 1 to 4 times per year    Active Member of [employer] or Organizations: No    Attends Archivist Meetings: Never    Marital Status: Never married     Family History: The patient's ***family history includes Diabetes in his mother; Hyperlipidemia in his mother; Hypertension in his mother.  ROS:   Please see the history of present illness.    *** All other systems reviewed and are negative.  EKGs/Labs/Other Studies Reviewed:    The following studies were reviewed today: ***  EKG:  EKG is *** ordered today.  The ekg ordered today demonstrates ***  Recent Labs: 09/28/2021: TSH 4.547 09/30/2021: Magnesium 2.1 10/13/2021: ALT 14; B Natriuretic Peptide  2,587.7 10/14/2021: BUN 66; Creatinine, Ser 3.44; Hemoglobin 13.0; Platelets 217; Potassium 3.5; Sodium 134  Recent Lipid Panel    Component Value Date/Time   CHOL 89 02/03/2020 0602   CHOL 249 (H) 10/11/2018 1226   CHOL 118 03/08/2014 0410   TRIG 125 02/03/2020 0602   TRIG 95 03/08/2014 0410   HDL 15 (L) 02/03/2020 0602   HDL 36 (L) 10/11/2018 1226   HDL 28 (L) 03/08/2014 0410   CHOLHDL 5.9 02/03/2020 0602   VLDL 25 02/03/2020 0602   VLDL 19 03/08/2014 0410   LDLCALC 49 02/03/2020 0602   LDLCALC 174 (H) 10/11/2018 1226   LDLCALC 71 03/08/2014 0410     Risk Assessment/Calculations:   {Does this patient have ATRIAL FIBRILLATION?:[phone]}   Physical Exam:  VS:  There were no vitals taken for this visit.    Wt Readings from Last 3 Encounters:  10/14/21 249 lb 12.5 oz (113.3 kg)  10/02/21 244 lb 11.2 oz (111 kg)  09/10/21 267 lb 3.2 oz (121.2 kg)     GEN: *** Well nourished, well developed in no acute distress HEENT: Normal NECK: No JVD; No carotid bruits LYMPHATICS: No lymphadenopathy CARDIAC: ***RRR, no murmurs, rubs, gallops RESPIRATORY:  Clear to auscultation without rales, wheezing or rhonchi  ABDOMEN: Soft, non-tender, non-distended MUSCULOSKELETAL:  No edema; No deformity  SKIN: Warm and dry NEUROLOGIC:  Alert and oriented x 3 PSYCHIATRIC:  Normal affect   ASSESSMENT:    No diagnosis found. PLAN:    In order of problems listed above:  ***  Disposition: Follow up {follow up:15908} with ***   Shared Decision Making/Informed Consent   {Are you ordering a CV Procedure (e.g. stress test, cath, DCCV, TEE, etc)?   Press F2        :[id number]}    Signed, Debbora Ang Ninfa Meeker, PA-C  10/14/2021 8:09 AM    [facility]

## 2021-10-14 NOTE — Progress Notes (Signed)
PROGRESS NOTE    Hector [name]  UXL:[id number] DOB: 04-Oct-1965 DOA: 10/13/2021 PCP: [name], MD  Outpatient Specialists: cardiology, nephrology    Brief Narrative:   From admission h and p Hector [name] is a 56 y.o. male with medical history significant for combined systolic and diastolic CHF, stage III chronic kidney disease, essential hypertension, dyslipidemia, paroxysmal atrial fibrillation/flutter and CVA, who presented to the emergency room with acute onset of worsening dyspnea with associated orthopnea and paroxysmal nocturnal dyspnea over the last few days as well as abdominal distention and mild lower extremity edema.  He denied any cough or wheezing.  No chest pain or palpitations.  No fever or chills.  No dysuria, oliguria or hematuria or flank pain.   Assessment & Plan:   Principal Problem:   Acute on chronic systolic (congestive) heart failure (HCC) Active Problems:   Nonischemic cardiomyopathy: EF 20-25%   Atrial fibrillation with rapid ventricular response (HCC)   Hypothyroidism   Essential hypertension   Obstructive sleep apnea   CKD (chronic kidney disease) stage 4, GFR 15-29 ml/min (HCC)   Stroke (HCC)   Alcohol abuse   Obesity (BMI 30-39.9)   Dyslipidemia  # Acute on chronic combined systolic/diastolic heart failure Presenting with swelling and dyspnea. Bnp markedly elevated. No o2 requirement or significant dyspnea; cxr does show vascular congestion. Lower extremity edema actually not marked. Med compliance a longstanding issue for this patient. Baseline EF is in the 30s when not in uncontrolled fib/flutter - cardiology consulted, will defer mgmt to them - currently written for lasix 80 iv bid - home torsemide currently held - cont home coreg, farxiga, imdur  # A-fib with rvr RVR resolved, hemodynamically stable - cont coreg, home eliquis  # Abdominal pain This morning complaining of diffuse abdominal pain. Labs unremarkable.  Abdomen is soft. No vomiting or diarrhea or dysuria - will check CT of abd/pelvis  # HTN Here bp appropriate - cont home coreg, hydral; diuresis as above  # OSA - cpap qhs  # Hx CVA - cont statin, qpixaban  # CKD stage 4 Followed by nephrology as outpt. Cr 3.44, stable from recent priors - monitor and renally dose meds  # Hx lower GI bleed No report of bleeding, hgb wnl - monitor     DVT prophylaxis: home apixaban Code Status: partial (no intubation) Family Communication: none at bedside  Level of care: Progressive Status is: Inpatient Remains inpatient appropriate because: IV diuresis    Consultants:  cardiology  Procedures: none  Antimicrobials:  none    Subjective: This morning diffuse abd pain, no dyspnea or chest pain  Objective: Vitals:   10/14/21 0340 10/14/21 0600 10/14/21 0733 10/14/21 0745  BP: 96/83 103/76 92/69 114/89  Pulse: 81  (!) 113 91  Resp: 18  16 16   Temp: 97.7 F (36.5 C)  98.6 F (37 C) 98.6 F (37 C)  TempSrc:   Oral Oral  SpO2: 95%  93% 95%  Weight:      Height:        Intake/Output Summary (Last 24 hours) at 10/14/2021 0939 Last data filed at 10/14/2021 5366 Gross per 24 hour  Intake --  Output 400 ml  Net -400 ml   Filed Weights   10/13/21 1821 10/14/21 0300  Weight: 111.4 kg 113.3 kg    Examination:  General exam: Appears calm and comfortable  Respiratory system: Clear to auscultation save for faint rales at bases. Respiratory effort normal. Cardiovascular system: distant heart sounds,  soft systolic murmur, irreg irreg Gastrointestinal system: Abdomen is obese, soft and mild ttp throughout. No organomegaly or masses felt. Normal bowel sounds heard. Central nervous system: Alert and oriented. No focal neurological deficits. mumbles Extremities: Symmetric 5 x 5 power. Skin: No rashes, lesions or ulcers Psychiatry: Judgement and insight appear normal. Mood & affect appropriate.     Data Reviewed: I have  personally reviewed following labs and imaging studies  CBC: Recent Labs  Lab 10/13/21 1550 10/14/21 0508  WBC 8.4 10.3  NEUTROABS 5.5  --   HGB 13.8 13.0  HCT 44.4 41.5  MCV 80.1 79.5*  PLT 271 016   Basic Metabolic Panel: Recent Labs  Lab 10/13/21 1550 10/14/21 0508  NA 133* 134*  K 3.7 3.5  CL 102 102  CO2 20* 21*  GLUCOSE 159* 143*  BUN 72* 66*  CREATININE 3.62* 3.44*  CALCIUM 8.7* 8.1*   GFR: Estimated Creatinine Clearance: 30.3 mL/min (A) (by C-G formula based on SCr of 3.44 mg/dL (H)). Liver Function Tests: Recent Labs  Lab 10/13/21 1550  AST 20  ALT 14  ALKPHOS 86  BILITOT 3.8*  PROT 6.7  ALBUMIN 3.5   No results for input(s): "LIPASE", "AMYLASE" in the last 168 hours. No results for input(s): "AMMONIA" in the last 168 hours. Coagulation Profile: No results for input(s): "INR", "PROTIME" in the last 168 hours. Cardiac Enzymes: No results for input(s): "CKTOTAL", "CKMB", "CKMBINDEX", "TROPONINI" in the last 168 hours. BNP (last 3 results) No results for input(s): "PROBNP" in the last 8760 hours. HbA1C: No results for input(s): "HGBA1C" in the last 72 hours. CBG: No results for input(s): "GLUCAP" in the last 168 hours. Lipid Profile: No results for input(s): "CHOL", "HDL", "LDLCALC", "TRIG", "CHOLHDL", "LDLDIRECT" in the last 72 hours. Thyroid Function Tests: No results for input(s): "TSH", "T4TOTAL", "FREET4", "T3FREE", "THYROIDAB" in the last 72 hours. Anemia Panel: No results for input(s): "VITAMINB12", "FOLATE", "FERRITIN", "TIBC", "IRON", "RETICCTPCT" in the last 72 hours. Urine analysis:    Component Value Date/Time   COLORURINE YELLOW (A) 09/10/2021 1302   APPEARANCEUR HAZY (A) 09/10/2021 1302   APPEARANCEUR Clear 02/26/2014 1126   LABSPEC 1.010 09/10/2021 1302   LABSPEC 1.008 02/26/2014 1126   PHURINE 5.0 09/10/2021 1302   GLUCOSEU NEGATIVE 09/10/2021 1302   GLUCOSEU Negative 02/26/2014 1126   HGBUR MODERATE (A) 09/10/2021 1302    BILIRUBINUR NEGATIVE 09/10/2021 1302   BILIRUBINUR Negative 02/26/2014 1126   KETONESUR NEGATIVE 09/10/2021 1302   PROTEINUR >=300 (A) 09/10/2021 1302   NITRITE NEGATIVE 09/10/2021 1302   LEUKOCYTESUR SMALL (A) 09/10/2021 1302   LEUKOCYTESUR 1+ 02/26/2014 1126   Sepsis Labs: @LABRCNTIP (procalcitonin:4,lacticidven:4)  ) Recent Results (from the past 240 hour(s))  Resp Panel by RT-PCR (Flu A&B, Covid) Anterior Nasal Swab     Status: None   Collection Time: 10/13/21  3:50 PM   Specimen: Anterior Nasal Swab  Result Value Ref Range Status   SARS Coronavirus 2 by RT PCR NEGATIVE NEGATIVE Final    Comment: (NOTE) SARS-CoV-2 target nucleic acids are NOT DETECTED.  The SARS-CoV-2 RNA is generally detectable in upper respiratory specimens during the acute phase of infection. The lowest concentration of SARS-CoV-2 viral copies this assay can detect is 138 copies/mL. A negative result does not preclude SARS-Cov-2 infection and should not be used as the sole basis for treatment or other patient management decisions. A negative result may occur with  improper specimen collection/handling, submission of specimen other than nasopharyngeal swab, presence of viral mutation(s) within the  areas targeted by this assay, and inadequate number of viral copies(<138 copies/mL). A negative result must be combined with clinical observations, patient history, and epidemiological information. The expected result is Negative.  Fact Sheet for Patients:  EntrepreneurPulse.com.au  Fact Sheet for Healthcare Providers:  IncredibleEmployment.be  This test is no t yet approved or cleared by the [place] FDA and  has been authorized for detection and/or diagnosis of SARS-CoV-2 by FDA under an Emergency Use Authorization (EUA). This EUA will remain  in effect (meaning this test can be used) for the duration of the COVID-19 declaration under Section 564(b)(1) of the Act,  21 U.S.C.section 360bbb-3(b)(1), unless the authorization is terminated  or revoked sooner.       Influenza A by PCR NEGATIVE NEGATIVE Final   Influenza B by PCR NEGATIVE NEGATIVE Final    Comment: (NOTE) The Xpert Xpress SARS-CoV-2/FLU/RSV plus assay is intended as an aid in the diagnosis of influenza from Nasopharyngeal swab specimens and should not be used as a sole basis for treatment. Nasal washings and aspirates are unacceptable for Xpert Xpress SARS-CoV-2/FLU/RSV testing.  Fact Sheet for Patients: EntrepreneurPulse.com.au  Fact Sheet for Healthcare Providers: IncredibleEmployment.be  This test is not yet approved or cleared by the [place] FDA and has been authorized for detection and/or diagnosis of SARS-CoV-2 by FDA under an Emergency Use Authorization (EUA). This EUA will remain in effect (meaning this test can be used) for the duration of the COVID-19 declaration under Section 564(b)(1) of the Act, 21 U.S.C. section 360bbb-3(b)(1), unless the authorization is terminated or revoked.  Performed at [facility], [address], [place], [place] [address]          Radiology Studies: [facility] Chest [place] 1 View  Result Date: 10/13/2021 CLINICAL DATA:  Shortness of breath EXAM: PORTABLE CHEST 1 VIEW COMPARISON:  09/28/2021, 02/26/2021 FINDINGS: Cardiomegaly with vascular congestion. No consolidation, pleural effusion, or pneumothorax. IMPRESSION: Cardiomegaly with vascular congestion. Electronically Signed   By: Donavan Foil M.D.   On: 10/13/2021 15:29        Scheduled Meds:  allopurinol  50 mg Oral Daily   apixaban  5 mg Oral BID   atorvastatin  80 mg Oral Daily   carvedilol  6.25 mg Oral BID WC   dapagliflozin propanediol  10 mg Oral QAC breakfast   furosemide  80 mg Intravenous BID   hydrALAZINE  25 mg Oral TID   isosorbide dinitrate  5 mg Oral BID   levothyroxine  100 mcg Oral QAC breakfast   Continuous  Infusions:   LOS: 1 day     Desma Maxim, MD [facility]   If 7PM-7AM, please contact night-coverage [email or web address] Password [facility] 10/14/2021, 9:39 AM

## 2021-10-14 NOTE — Consult Note (Signed)
Cardiology Consult    Patient ID: KAYNEN MINNER MRN: [id number], DOB/AGE: 1965-07-24   Admit date: 10/13/2021 Date of Consult: 10/14/2021  Primary Physician: [name], MD Primary Cardiologist: Ida Rogue, MD Requesting Provider: Imagene Riches, MD  Patient Profile    Hector [name] is a 56 y.o. 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 Si Raider.  Past Medical History   Past Medical History:  Diagnosis Date   Alcohol abuse    Alcoholic cardiomyopathy ([place])    a. 12/2007 MV: EF 28%, no isch;  b. 8/12 Echo: EF 25-35%; c. 02/2014 Echo: EF 20-25%; d. 12/2014 Cath: minimal CAD; e. 01/2015 Echo: EF 50-55%;  d. 05/2016 Echo: EF 30-35%, diff HK, gr2 DD; e. 11/2016 Echo: EF 45-50%, diff HK; f. 09/2021 Echo: EF <20%.   Chronic combined systolic (congestive) and diastolic (congestive) heart failure ([place])    a. 05/2016 Echo: EF 30-35%, diff HK, Gr2 DD, mild MR, mod dil LA; b. 11/2016 Echo: EF 45-50%, diff HK; c. 09/2021 Echo: EF < 20%, no rwma, mildly reduced RV fxn, mod dil LA, mildly dil RA, mod MR, AoV sclerosis.   CKD (chronic kidney disease), stage III (HCC)    Elevated troponin (chronic)    Essential hypertension    GI bleed 11/2013   Hyperlipidemia    Pancreatitis    Paroxysmal A-fib (HCC)    a. new onset s/p unsuccessful TEE/DCCV on 08/16/2014; b. CHA2DS2VASc = 3-> eliquis (freq noncompliant); c. 02/2021 s/p TEE/DCCV; d. Prev on amio->d/c 2/2 abnl TFTs.   Paroxysmal atrial flutter (HCC)    Sleep-disordered breathing    Has yet to have a sleep study   Stroke [facility])     Past Surgical History:  Procedure Laterality Date   CARDIAC CATHETERIZATION N/A 01/09/2015   Procedure: Left Heart Cath and Coronary  Angiography;  Surgeon: [name], MD;  Location: [facility];  Service: Cardiovascular;  Laterality: N/A;   CARDIOVERSION N/A 03/05/2021   Procedure: CARDIOVERSION;  Surgeon: Kate Sable, MD;  Location: [facility] ORS;  Service: Cardiovascular;  Laterality: N/A;   COLONOSCOPY N/A 07/22/2020   Procedure: COLONOSCOPY;  Surgeon: Toledo, Benay Pike, MD;  Location: [facility] ENDOSCOPY;  Service: Gastroenterology;  Laterality: N/A;   ELECTROPHYSIOLOGIC STUDY N/A 08/16/2014   Procedure: CARDIOVERSION;  Surgeon: [name], MD;  Location: [facility] ORS;  Service: Cardiovascular;  Laterality: N/A;   FLEXIBLE SIGMOIDOSCOPY N/A 10/10/2015   Procedure: FLEXIBLE SIGMOIDOSCOPY;  Surgeon: [name], MD;  Location: [facility] ENDOSCOPY;  Service: Endoscopy;  Laterality: N/A;   KNEE SURGERY Right    NM MYOVIEW LTD  November 2011   No ischemia or infarction. EF 50-55% ( no improvement from 2009 Myoview EF of 28%   TEE WITHOUT CARDIOVERSION N/A 08/16/2014   Procedure: TRANSESOPHAGEAL ECHOCARDIOGRAM (TEE);  Surgeon: [name], MD;  Location: [facility] ORS;  Service: Cardiovascular;  Laterality: N/A;   TEE WITHOUT CARDIOVERSION N/A 03/05/2021   Procedure: TRANSESOPHAGEAL ECHOCARDIOGRAM (TEE);  Surgeon: Kate Sable, MD;  Location: [facility] ORS;  Service: Cardiovascular;  Laterality: N/A;     Allergies  Allergies  Allergen Reactions   Esomeprazole Magnesium Other (See Comments)    Suspected interstitial nephritis 2018   Eggs Or Egg-Derived Products  Rash    History of Present Illness     56 y.o. 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  Heart failure history dates back to at least 2009 and was felt to be secondary to alcohol use and atrial arrhythmias.  EF was 20% at that time.  In the setting of noncompliance with  anticoagulation and follow-up, he was rate controlled for prolonged period of time.  He subsequently underwent multiple noninvasive ischemic evaluations, which were low risk, and finally underwent diagnostic catheterization in 2016, which showed minimal, nonobstructive CAD.  He underwent cardioversion in December 2016 and with restoration of sinus rhythm, his EF did improve.  He was later placed on amiodarone but this was subsequently discontinued secondary to abnormal thyroid function tests.  He has since been on levothyroxine.  He was readmitted in January 2023 with recurrent atrial flutter/fib and heart failure.  TEE and cardioversion was carried out with restoration of sinus rhythm.  EF at that time was less than 20% with severely reduced RV function.  He was seen by electrophysiology in March 2023 in the outpatient setting at that time, reported noncompliance with medications and was not sure what he was taking.  In that setting, he was not felt to be a suitable candidate for catheter ablation or ICD therapy at that time.  In July 2023, he was admitted with complaints of abdominal discomfort and distention.  He underwent extensive GI work-up including MRCP and HIDA scan with recommendation for conservative therapy.  He was discharged home off of diuretic therapy due to worsening renal function.  He was readmitted on August 6 in the setting of worsening volume overload and noncompliance.  Presentation ECG notable for sinus rhythm with PACs but then he was subsequently noted to be in A-fib with RVR with rates typically in the low 100s on telemetry.  Home medications were resumed and he was aggressively diuresed with good response.  Echo during admission showed an EF of less than 20%.  He was placed back on carvedilol, Farxiga, hydralazine, and Isordil and subsequently transition to torsemide 40 mg once daily at discharge.  Discharge weight was 111 kg.  Following discharge on August 10, patient says that he  has mostly been skipping his morning medications because he likes to take them with breakfast, but has not been eating breakfast.  He says he does not miss any doses of torsemide.  He has a scale but has not opened the box yet and therefore, has not been weighing himself.  Over the past 2 weeks, he has noted increasing abdominal girth, mild lower extremity edema, and dyspnea on exertion.  Due to progressive abdominal bloating and discomfort, which she attributed to putting on fluid, he presented back to the emergency room on August 21.  Here, his BNP was elevated 2587.7.  Creatinine was slightly above at baseline at 3.62 but is closer to baseline at 3.44 this morning.  Troponin is moderately elevated at 109 with follow-up of 95.  Chest x-ray showed cardiomegaly vascular congestion.  He was placed on intravenous Lasix and admitted for further evaluation.  This morning, he continues to note significant abdominal distention.  Weight currently recorded at about 2 kg above prior to discharge weight.  Patient denies any use of tobacco, alcohol, or drugs.  He remains in afib/flutter today w/ rates in the 80's  to low 110's.  Inpatient Medications     allopurinol  50 mg Oral Daily   apixaban  5 mg Oral BID   atorvastatin  80 mg Oral Daily   carvedilol  6.25 mg Oral BID WC   dapagliflozin propanediol  10 mg Oral QAC breakfast   furosemide  80 mg Intravenous BID   hydrALAZINE  25 mg Oral TID   isosorbide dinitrate  5 mg Oral BID   levothyroxine  100 mcg Oral QAC breakfast    Family History    Family History  Problem Relation Age of Onset   Hypertension Mother    Hyperlipidemia Mother    Diabetes Mother    He indicated that his mother is alive. He indicated that his father is deceased.   Social History    Social History   Socioeconomic History   Marital status: Single    Spouse name: Not on file   Number of children: Not on file   Years of education: Not on file   Highest education level: Not  on file  Occupational History   Not on file  Tobacco Use   Smoking status: Former    Packs/day: 1.00    Years: 12.00    Total pack years: 12.00    Types: Cigarettes    Quit date: 02/23/1998    Years since quitting: 23.6   Smokeless tobacco: Never  Vaping Use   Vaping Use: Never used  Substance and Sexual Activity   Alcohol use: Not Currently    Alcohol/week: 0.0 standard drinks of alcohol    Comment: Past heavy drinker   Drug use: No   Sexual activity: Not Currently  Other Topics Concern   Not on file  Social History Narrative   Not on file   Social Determinants of Health   Financial Resource Strain: Low Risk  (01/04/2019)   Overall Financial Resource Strain (CARDIA)    Difficulty of Paying Living Expenses: Not hard at all  Food Insecurity: No Food Insecurity (01/04/2019)   Hunger Vital Sign    Worried About Running Out of Food in the Last Year: Never true    [place] in the Last Year: Never true  Transportation Needs: No Transportation Needs (01/04/2019)   PRAPARE - Hydrologist (Medical): No    Lack of Transportation (Non-Medical): No  Physical Activity: Inactive (01/04/2019)   Exercise Vital Sign    Days of Exercise per Week: 0 days    Minutes of Exercise per Session: 0 min  Stress: No Stress Concern Present (01/04/2019)   [place]    Feeling of Stress : Not at all  Social Connections: Moderately Isolated (01/04/2019)   Social Connection and Isolation Panel [NHANES]    Frequency of Communication with Friends and Family: More than three times a week    Frequency of Social Gatherings with Friends and Family: More than three times a week    Attends Religious Services: 1 to 4 times per year    Active Member of [employer] or Organizations: No    Attends Archivist Meetings: Never    Marital Status: Never married  Intimate Partner Violence: Not At Risk  (01/04/2019)   Humiliation, Afraid, Rape, and Kick questionnaire    Fear of Current or Ex-Partner: No    Emotionally Abused: No    Physically Abused: No    Sexually Abused: No     Review of Systems  General:  No chills, fever, night sweats or weight changes.  Cardiovascular:  No chest pain, +++ dyspnea on exertion, +++ mild LE edema, no orthopnea, palpitations, paroxysmal nocturnal dyspnea.  +++ inc abd girth. Dermatological: No rash, lesions/masses Respiratory: No cough, +++ dyspnea Urologic: No hematuria, dysuria Abdominal:   No nausea, vomiting, diarrhea, bright red blood per rectum, melena, or hematemesis Neurologic:  No visual changes, wkns, changes in mental status. All other systems reviewed and are otherwise negative except as noted above.  Physical Exam    Blood pressure (!) 161/130, pulse 94, temperature 98.5 F (36.9 C), resp. rate 16, height 5\' 11"  (1.803 m), weight 113.3 kg, SpO2 96 %.  General: Pleasant, NAD Psych: Normal affect. Neuro: Alert and oriented X 3. Moves all extremities spontaneously. HEENT: Normal  Neck: Supple without bruits.  JVD to jaw. Lungs:  Resp regular and unlabored, bibasilar crackles. Heart: IR, IR, tachy, no s3, s4, or murmurs. Abdomen: Obese semi-firm/distended.  Nontender, BS + x 4.  Extremities: No clubbing, cyanosis.  Trace bilat LE edema. DP/PT2+, Radials 2+ and equal bilaterally.  Labs    Cardiac Enzymes Recent Labs  Lab 10/13/21 1550 10/13/21 1849  TROPONINIHS 109* 95*     BNP    Component Value Date/Time   BNP 2,587.7 (H) 10/13/2021 1550   Lab Results  Component Value Date   WBC 10.3 10/14/2021   HGB 13.0 10/14/2021   HCT 41.5 10/14/2021   MCV 79.5 (L) 10/14/2021   PLT 217 10/14/2021    Recent Labs  Lab 10/13/21 1550 10/14/21 0508  NA 133* 134*  K 3.7 3.5  CL 102 102  CO2 20* 21*  BUN 72* 66*  CREATININE 3.62* 3.44*  CALCIUM 8.7* 8.1*  PROT 6.7  --   BILITOT 3.8*  --   ALKPHOS 86  --   ALT 14  --    AST 20  --   GLUCOSE 159* 143*   Lab Results  Component Value Date   CHOL 89 02/03/2020   HDL 15 (L) 02/03/2020   LDLCALC 49 02/03/2020   TRIG 125 02/03/2020   Lab Results  Component Value Date   DDIMER 0.72 (H) 07/21/2020   Lab Results  Component Value Date   TSH 3.714 10/14/2021    Lab Results  Component Value Date   HGBA1C 7.7 (H) 12/23/2019    Radiology Studies    DG Chest Port 1 View  Result Date: 10/13/2021 CLINICAL DATA:  Shortness of breath EXAM: PORTABLE CHEST 1 VIEW COMPARISON:  09/28/2021, 02/26/2021 FINDINGS: Cardiomegaly with vascular congestion. No consolidation, pleural effusion, or pneumothorax. IMPRESSION: Cardiomegaly with vascular congestion. Electronically Signed   By: Donavan Foil M.D.   On: 10/13/2021 15:29   ECG & Cardiac Imaging    Aflutter, 126, ant infarct, IVCD  - personally reviewed. Was sinus rhythm 09/28/2021 however, notes indicate that he was having afib/flutter throughout admission 8/6  8/10.  Assessment & Plan    1.  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  He was recently admitted in early August with recurrent atrial arrhythmias and heart failure, and subsequently diuresed and discharged home at 111 kg on August 10.  Echo during that admission showed an EF of less than 20%.  Following discharge, he has been missing his morning medications as he likes to take them with breakfast, but has been  skipping breakfast.  In that setting, he has had increasing abdominal girth with dyspnea and mild lower extremity swelling.  He presented back to the emergency department on August 21 where BNP was elevated at 2587.7 and chest x-ray showed cardiomegaly with vascular congestion.  He was noted to have recurrent A-fib flutter during his  last admission, and this persists at this time.  Previous discharge medications have been resumed including beta-blocker, hydralazine, nitrate, and SGLT2 inhibitor.  He is not a candidate for ACE inhibitor/ARB/MRA/ARNI secondary to stage IV kidney disease.  If renal function worsens, will have to consider discontinuation of SGLT2 inhibitor.  We will escalate his Lasix to 80 mg IV twice daily.  Suspect A-fib/flutter is playing a large role in his volume excess and LV dysfunction.  See below.  2.  Persistent atrial fibrillation/flutter: On presentation on August 6, ECG showed sinus rhythm with PACs.  He subsequently was noted to be in A-fib flutter on telemetry, which was persistent throughout hospitalization and was managed with oral beta-blocker therapy.  He is anticoagulated with Eliquis however his compliance is poor and he admits to regularly skipping morning doses.  Antiarrhythmic management is complicated by prior intolerance to amiodarone with elevation of TFTs during usage, and heart failure and chronic kidney disease, which limit use of multiple other agents.  He was previous evaluated by electrophysiology but not felt to be a candidate for catheter ablation in the setting of noncompliance.  He will need outpatient EP reevaluation for consideration of biventricular pacing versus ICD placement and AV nodal ablation.  Continue beta-blocker and Eliquis.  Stressed importance of compliance.  3.  Stage IV chronic kidney disease: Creatinine slightly elevated from prior at 3.62 on admission.  3.44 this morning.  Follow with diuresis.  4.  Essential HTN:  stable.  5.  Nonobs CAD/demand ischemia:  minor irregs on cath in 2016.  Mild HsTrop elevation in the setting of low output CHF Demand ischemia.  Cont ? blocker and statin rx.  No asa in setting of eliquis.  6.  HL: cont statin.  7.  OSA:  reportedly uses CPAP @ home.  8.  Hypothyroidism:  On synthroid w/ nl TSH.  9.  DMII:  A1c 7.7 in 2021.  On  sglt2i.  Insulin mgmt per IM.  Risk Assessment/Risk Scores:        New York Heart Association (NYHA) Functional Class NYHA Class III  CHA2DS2-VASc Score = 6   This indicates a 9.7% annual risk of stroke. The patient's score is based upon: CHF History: 1 HTN History: 1 Diabetes History: 1 Stroke History: 2 Vascular Disease History: 1 Age Score: 0 Gender Score: 0     Signed, Murray Hodgkins, NP 10/14/2021, 12:28 PM  For questions or updates, please contact   Please consult [email or web address] for contact info under Cardiology/STEMI.

## 2021-10-14 NOTE — Plan of Care (Signed)

## 2021-10-14 NOTE — TOC Initial Note (Signed)
Transition of Care [facility]) - Initial/Assessment Note    Patient Details  Name: Hector [name] MRN: [id number] Date of Birth: 08-29-1965  Transition of [facility] CM/SW Contact:    Candie Chroman, LCSW Phone Number: 10/14/2021, 1:25 PM  Clinical Narrative:  Readmission prevention screen complete. CSW met with patient. No supports at bedside. CSW introduced role and explained that discharge planning would be discussed. PCP is Rutherford Guys, MD. Brother or neighbor typically take him to appointments. He uses the [facility] or [facility] on [address]. No issues obtaining medications other than Princella Ion taking several days to get his medications. No home health or DME use prior to admission. He does have a cane and RW at home if needed. No further concerns. CSW encouraged patient to contact CSW as needed. CSW will continue to follow patient for support and facilitate return home once stable. His brother will likely transport him home at discharge.                Expected Discharge Plan: Home/Self Care Barriers to Discharge: Continued Medical Work up   Patient Goals and CMS Choice        Expected Discharge Plan and Services Expected Discharge Plan: Home/Self Care       Living arrangements for the past 2 months: Single Family Home                                      Prior Living Arrangements/Services Living arrangements for the past 2 months: Single Family Home   Patient language and need for interpreter reviewed:: Yes Do you feel safe going back to the place where you live?: Yes      Need for Family Participation in Patient Care: Yes (Comment)     Criminal Activity/Legal Involvement Pertinent to Current Situation/Hospitalization: No - Comment as needed  Activities of Daily Living      Permission Sought/Granted                  Emotional Assessment Appearance:: Appears stated age Attitude/Demeanor/Rapport: Engaged Affect (typically  observed): Accepting, Calm Orientation: : Oriented to Self, Oriented to Place, Oriented to  Time, Oriented to Situation Alcohol / Substance Use: Not Applicable Psych Involvement: No (comment)  Admission diagnosis:  Shortness of breath [R06.02] Tachypnea [R06.82] Acute pulmonary edema (HCC) [J81.0] Respiratory distress [R06.03] Acute on chronic systolic CHF (congestive heart failure) (HCC) [I50.23] Acute on chronic systolic (congestive) heart failure (HCC) [I50.23] Patient Active Problem List   Diagnosis Date Noted   Atrial fibrillation with rapid ventricular response ([place]) 10/13/2021   Dyslipidemia 10/13/2021   Obesity (BMI 30-39.9) 10/01/2021   Acute on chronic systolic (congestive) heart failure (HCC) 09/28/2021   Right upper quadrant pain 09/10/2021   Nausea and vomiting 09/10/2021   Atrial flutter with rapid ventricular response ([place]) 03/01/2021   GI bleeding 11/30/2020   Hypothyroidism 11/30/2020   Stroke ([place])    Alcohol abuse    CKD (chronic kidney disease), stage IV (HCC)    Chronic systolic CHF (congestive heart failure) (HCC)    Iron deficiency anemia    Congestive heart failure (CHF) ([place]) 09/12/2020   CKD (chronic kidney disease) stage 4, GFR 15-29 ml/min ([place]) 07/21/2020   GIB (gastrointestinal bleeding) 07/21/2020   Acute blood loss anemia 07/21/2020   Hyperthyroidism without thyroid nodule 02/03/2020   Acute respiratory distress syndrome (ARDS) due to COVID-19 virus ([place]) 12/31/2019  Acute renal failure superimposed on stage 3b chronic kidney disease ([place]) 12/31/2019   Hyperkalemia 12/31/2019   Hyponatremia 12/31/2019   Acute on chronic systolic CHF (congestive heart failure) ([place]) 07/21/2016   Obstructive sleep apnea 04/21/2016   Nonischemic cardiomyopathy ([name]) 05/26/2015   Chronic combined systolic (congestive) and diastolic (congestive) heart failure ([place]) 04/01/2015   Essential hypertension 04/01/2015   Sinus bradycardia 01/29/2015   Nonischemic  cardiomyopathy: EF 20-25% 09/21/2014   Paroxysmal atrial fibrillation ([place]) 08/16/2014   Paroxysmal atrial flutter ([place]) 08/14/2014    Class: Acute   Hypertensive heart disease    Hyperlipidemia    PCP:  [name], MD Pharmacy:   [facility] [address]), [place] [address] Phone: [phone] Fax: [phone]     Social Determinants of Health (SDOH) Interventions    Readmission Risk Interventions    10/14/2021    1:24 PM 03/04/2021   10:16 AM 09/14/2020    3:06 PM  Readmission Risk Prevention Plan  Transportation Screening Complete Complete Complete  PCP or Specialist Appt within 3-5 Days Complete Complete   HRI or [place]  Complete   Social Work Consult for [place] Planning/Counseling Complete Complete   Palliative Care Screening Not Applicable Not Applicable   Medication Review Press photographer) Complete Complete Complete  PCP or Specialist appointment within 3-5 days of discharge   Complete  HRI or Home Care Consult   Complete  SW Recovery Care/Counseling Consult   Complete  Palliative Care Screening   Not Applicable  Skilled Nursing Facility   Complete

## 2021-10-14 NOTE — TOC CM/SW Note (Signed)
Went by room for readmission prevention screen. Patient was sleeping and asked CSW to come back later.  Hector [name], Hector [name]

## 2021-10-15 DIAGNOSIS — N184 Chronic kidney disease, stage 4 (severe): Secondary | ICD-10-CM | POA: Diagnosis not present

## 2021-10-15 DIAGNOSIS — I5023 Acute on chronic systolic (congestive) heart failure: Secondary | ICD-10-CM | POA: Diagnosis not present

## 2021-10-15 DIAGNOSIS — I4891 Unspecified atrial fibrillation: Secondary | ICD-10-CM | POA: Diagnosis not present

## 2021-10-15 LAB — BASIC METABOLIC PANEL
Anion gap: 10 (ref 5–15)
BUN: 61 mg/dL — ABNORMAL HIGH (ref 6–20)
CO2: 24 mmol/L (ref 22–32)
Calcium: 8 mg/dL — ABNORMAL LOW (ref 8.9–10.3)
Chloride: 102 mmol/L (ref 98–111)
Creatinine, Ser: 3.39 mg/dL — ABNORMAL HIGH (ref 0.61–1.24)
GFR, Estimated: 20 mL/min — ABNORMAL LOW (ref >60)
Glucose, Bld: 126 mg/dL — ABNORMAL HIGH (ref 70–99)
Potassium: 3.6 mmol/L (ref 3.5–5.1)
Sodium: 136 mmol/L (ref 135–145)

## 2021-10-15 MED ORDER — ISOSORBIDE DINITRATE 10 MG PO TABS
5.0000 mg | ORAL_TABLET | Freq: Three times a day (TID) | ORAL | Status: DC
  Administered 2021-10-15 – 2021-10-19 (×13): 5 mg via ORAL
  Filled 2021-10-15 (×17): qty 0.5

## 2021-10-15 MED ORDER — OXYCODONE-ACETAMINOPHEN 5-325 MG PO TABS
1.0000 | ORAL_TABLET | Freq: Four times a day (QID) | ORAL | Status: DC | PRN
  Administered 2021-10-15 – 2021-10-20 (×11): 1 via ORAL
  Filled 2021-10-15 (×11): qty 1

## 2021-10-15 NOTE — Progress Notes (Signed)
PROGRESS NOTE    Hector [name]  NWG:[id number] DOB: Oct 03, 1965 DOA: 10/13/2021 PCP: [name], MD    Assessment & Plan:   Principal Problem:   Acute on chronic systolic (congestive) heart failure (HCC) Active Problems:   Nonischemic cardiomyopathy: EF 20-25%   Atrial fibrillation with rapid ventricular response (HCC)   Hypothyroidism   Essential hypertension   Obstructive sleep apnea   CKD (chronic kidney disease) stage 4, GFR 15-29 ml/min (HCC)   Stroke (HCC)   Alcohol abuse   Obesity (BMI 30-39.9)   Dyslipidemia  Assessment and Plan: Acute on chronic combined CHF: continue on IV laisx. Monitor I/Os. CXR shows vascular congestion. Continue on coreg, farxiga, imdur    Likely PAF: continue on coreg, eliquis    Abdominal pain: CT shows likely proctitis & cholelithiasis   HTN: continue on  coreg, hydralazine.    OSA: CPAP qhs    Hx CVA: continue on statin, eliquis    CKDIV: Cr is labile. Avoid nephrotoxic meds    Hx lower GI bleed: H&H are WNL   Obesity: BMI 34.8. Complicates overall care & prognosis    DVT prophylaxis: eliquis  Code Status: full  Family Communication:  Disposition Plan: likely d/c back home  Level of care: Progressive  Status is: Inpatient Remains inpatient appropriate because: severity of illness    Consultants:  Cardio   Procedures:   Antimicrobials:   Subjective: Pt c/o shortness of breath   Objective: Vitals:   10/15/21 0035 10/15/21 0522 10/15/21 0600 10/15/21 0732  BP: 96/76 93/70 107/86 105/86  Pulse: (!) 54 (!) 103 100 99  Resp: 18 18 18 14   Temp: 97.8 F (36.6 C) 97.8 F (36.6 C)  97.6 F (36.4 C)  TempSrc: Oral   Oral  SpO2: 97% (!) 89% 95% (!) 87%  Weight:      Height:        Intake/Output Summary (Last 24 hours) at 10/15/2021 0916 Last data filed at 10/15/2021 0600 Gross per 24 hour  Intake 240 ml  Output 3325 ml  Net -3085 ml   Filed Weights   10/13/21 1821 10/14/21 0300  Weight: 111.4  kg 113.3 kg    Examination:  General exam: Appears calm and comfortable  Respiratory system: diminished breath sounds b/l  Cardiovascular system: S1 & S2+. No  rubs, gallops or clicks Gastrointestinal system: Abdomen is obese, soft and nontender. . Normal bowel sounds heard. Central nervous system: Alert and oriented. Moves all extremities  Psychiatry: Judgement and insight appear normal. Flat mood and affect    Data Reviewed: I have personally reviewed following labs and imaging studies  CBC: Recent Labs  Lab 10/13/21 1550 10/14/21 0508  WBC 8.4 10.3  NEUTROABS 5.5  --   HGB 13.8 13.0  HCT 44.4 41.5  MCV 80.1 79.5*  PLT 271 578   Basic Metabolic Panel: Recent Labs  Lab 10/13/21 1550 10/14/21 0508 10/15/21 0616  NA 133* 134* 136  K 3.7 3.5 3.6  CL 102 102 102  CO2 20* 21* 24  GLUCOSE 159* 143* 126*  BUN 72* 66* 61*  CREATININE 3.62* 3.44* 3.39*  CALCIUM 8.7* 8.1* 8.0*   GFR: Estimated Creatinine Clearance: 30.8 mL/min (A) (by C-G formula based on SCr of 3.39 mg/dL (H)). Liver Function Tests: Recent Labs  Lab 10/13/21 1550  AST 20  ALT 14  ALKPHOS 86  BILITOT 3.8*  PROT 6.7  ALBUMIN 3.5   No results for input(s): "LIPASE", "AMYLASE" in the last 168  hours. No results for input(s): "AMMONIA" in the last 168 hours. Coagulation Profile: No results for input(s): "INR", "PROTIME" in the last 168 hours. Cardiac Enzymes: No results for input(s): "CKTOTAL", "CKMB", "CKMBINDEX", "TROPONINI" in the last 168 hours. BNP (last 3 results) No results for input(s): "PROBNP" in the last 8760 hours. HbA1C: No results for input(s): "HGBA1C" in the last 72 hours. CBG: No results for input(s): "GLUCAP" in the last 168 hours. Lipid Profile: No results for input(s): "CHOL", "HDL", "LDLCALC", "TRIG", "CHOLHDL", "LDLDIRECT" in the last 72 hours. Thyroid Function Tests: Recent Labs    10/14/21 0508  TSH 3.714   Anemia Panel: No results for input(s): "VITAMINB12",  "FOLATE", "FERRITIN", "TIBC", "IRON", "RETICCTPCT" in the last 72 hours. Sepsis Labs: No results for input(s): "PROCALCITON", "LATICACIDVEN" in the last 168 hours.  Recent Results (from the past 240 hour(s))  Resp Panel by RT-PCR (Flu A&B, Covid) Anterior Nasal Swab     Status: None   Collection Time: 10/13/21  3:50 PM   Specimen: Anterior Nasal Swab  Result Value Ref Range Status   SARS Coronavirus 2 by RT PCR NEGATIVE NEGATIVE Final    Comment: (NOTE) SARS-CoV-2 target nucleic acids are NOT DETECTED.  The SARS-CoV-2 RNA is generally detectable in upper respiratory specimens during the acute phase of infection. The lowest concentration of SARS-CoV-2 viral copies this assay can detect is 138 copies/mL. A negative result does not preclude SARS-Cov-2 infection and should not be used as the sole basis for treatment or other patient management decisions. A negative result may occur with  improper specimen collection/handling, submission of specimen other than nasopharyngeal swab, presence of viral mutation(s) within the areas targeted by this assay, and inadequate number of viral copies(<138 copies/mL). A negative result must be combined with clinical observations, patient history, and epidemiological information. The expected result is Negative.  Fact Sheet for Patients:  EntrepreneurPulse.com.au  Fact Sheet for Healthcare Providers:  IncredibleEmployment.be  This test is no t yet approved or cleared by the [place] FDA and  has been authorized for detection and/or diagnosis of SARS-CoV-2 by FDA under an Emergency Use Authorization (EUA). This EUA will remain  in effect (meaning this test can be used) for the duration of the COVID-19 declaration under Section 564(b)(1) of the Act, 21 U.S.C.section 360bbb-3(b)(1), unless the authorization is terminated  or revoked sooner.       Influenza A by PCR NEGATIVE NEGATIVE Final   Influenza B by  PCR NEGATIVE NEGATIVE Final    Comment: (NOTE) The Xpert Xpress SARS-CoV-2/FLU/RSV plus assay is intended as an aid in the diagnosis of influenza from Nasopharyngeal swab specimens and should not be used as a sole basis for treatment. Nasal washings and aspirates are unacceptable for Xpert Xpress SARS-CoV-2/FLU/RSV testing.  Fact Sheet for Patients: EntrepreneurPulse.com.au  Fact Sheet for Healthcare Providers: IncredibleEmployment.be  This test is not yet approved or cleared by the [place] FDA and has been authorized for detection and/or diagnosis of SARS-CoV-2 by FDA under an Emergency Use Authorization (EUA). This EUA will remain in effect (meaning this test can be used) for the duration of the COVID-19 declaration under Section 564(b)(1) of the Act, 21 U.S.C. section 360bbb-3(b)(1), unless the authorization is terminated or revoked.  Performed at [facility], [address], [place], [place] [address]          Radiology Studies: CT ABDOMEN PELVIS WO CONTRAST  Result Date: 10/14/2021 CLINICAL DATA:  Diffuse abdominal pain. EXAM: CT ABDOMEN AND PELVIS WITHOUT CONTRAST TECHNIQUE:  Multidetector CT imaging of the abdomen and pelvis was performed following the standard protocol without IV contrast. RADIATION DOSE REDUCTION: This exam was performed according to the departmental dose-optimization program which includes automated exposure control, adjustment of the mA and/or kV according to patient size and/or use of iterative reconstruction technique. COMPARISON:  MRCP and CT abdomen pelvis dated September 10, 2021. FINDINGS: Lower chest: No acute abnormality. Similar chronic interlobular septal thickening at the lateral costophrenic sulci. Small right lower lobe nodules measuring up to 5 mm, 1 of which are calcified, are unchanged. No follow-up imaging is recommended. Hepatobiliary: No focal liver abnormality. Small gallstones and sludge.  No gallbladder wall thickening or biliary dilatation. Pancreas: Unremarkable. No pancreatic ductal dilatation or surrounding inflammatory changes. Spleen: Normal in size without focal abnormality. Adrenals/Urinary Tract: Adrenal glands are unremarkable. Multiple bilateral renal simple cysts are unchanged. No follow-up imaging is recommended. No renal calculi or hydronephrosis. Chronic circumferential bladder wall thickening is unchanged. Stomach/Bowel: Circumferential rectal wall thickening. Diffuse colonic diverticulosis. The stomach and small bowel are unremarkable. Normal appendix. Vascular/Lymphatic: Aortic atherosclerosis. No enlarged abdominal or pelvic lymph nodes. Reproductive: Prostate is unremarkable. Other: Unchanged small fat containing umbilical and right inguinal hernias. Trace free fluid in the pelvis. No pneumoperitoneum. Musculoskeletal: No acute or significant osseous findings. IMPRESSION: 1. Circumferential rectal wall thickening, concerning for proctitis. 2. Trace free fluid in the pelvis, nonspecific. 3. Cholelithiasis. 4. Aortic Atherosclerosis (ICD10-I70.0). Electronically Signed   By: Titus Dubin M.D.   On: 10/14/2021 14:54   DG Chest Port 1 View  Result Date: 10/13/2021 CLINICAL DATA:  Shortness of breath EXAM: PORTABLE CHEST 1 VIEW COMPARISON:  09/28/2021, 02/26/2021 FINDINGS: Cardiomegaly with vascular congestion. No consolidation, pleural effusion, or pneumothorax. IMPRESSION: Cardiomegaly with vascular congestion. Electronically Signed   By: Donavan Foil M.D.   On: 10/13/2021 15:29        Scheduled Meds:  allopurinol  50 mg Oral Daily   apixaban  5 mg Oral BID   atorvastatin  80 mg Oral Daily   carvedilol  6.25 mg Oral BID WC   dapagliflozin propanediol  10 mg Oral QAC breakfast   furosemide  80 mg Intravenous BID   hydrALAZINE  25 mg Oral TID   isosorbide dinitrate  5 mg Oral TID   levothyroxine  100 mcg Oral QAC breakfast   Continuous Infusions:   LOS: 2  days    Time spent: 35 mins     Wyvonnia Dusky, MD [facility] Pager [phone]  If 7PM-7AM, please contact night-coverage [email or web address] 10/15/2021, 9:16 AM

## 2021-10-15 NOTE — Progress Notes (Signed)
Progress Note  Patient Name: Hector [name] Date of Encounter: 10/15/2021  [facility] Cardiologist: Ida Rogue, MD   Subjective   Shortness of breath improving, edema and abdominal distention also improving.  Inpatient Medications    Scheduled Meds:  allopurinol  50 mg Oral Daily   apixaban  5 mg Oral BID   atorvastatin  80 mg Oral Daily   carvedilol  6.25 mg Oral BID WC   dapagliflozin propanediol  10 mg Oral QAC breakfast   furosemide  80 mg Intravenous BID   hydrALAZINE  25 mg Oral TID   isosorbide dinitrate  5 mg Oral TID   levothyroxine  100 mcg Oral QAC breakfast   Continuous Infusions:  PRN Meds: acetaminophen **OR** acetaminophen, ondansetron **OR** ondansetron (ZOFRAN) IV, oxyCODONE-acetaminophen, traZODone   Vital Signs    Vitals:   10/15/21 0035 10/15/21 0522 10/15/21 0600 10/15/21 0732  BP: 96/76 93/70 107/86 105/86  Pulse: (!) 54 (!) 103 100 99  Resp: 18 18 18 14   Temp: 97.8 F (36.6 C) 97.8 F (36.6 C)  97.6 F (36.4 C)  TempSrc: Oral   Oral  SpO2: 97% (!) 89% 95% (!) 87%  Weight:      Height:        Intake/Output Summary (Last 24 hours) at 10/15/2021 1042 Last data filed at 10/15/2021 0600 Gross per 24 hour  Intake 240 ml  Output 3325 ml  Net -3085 ml      10/14/2021    3:00 AM 10/13/2021    6:21 PM 10/02/2021    4:53 AM  Last 3 Weights  Weight (lbs) 249 lb 12.5 oz 245 lb 8 oz 244 lb 11.2 oz  Weight (kg) 113.3 kg 111.358 kg 110.995 kg      Telemetry    Atrial fibrillation, heart rate 97- Personally Reviewed  ECG     - Personally Reviewed  Physical Exam   GEN: No acute distress.   Neck: No JVD Cardiac: Irregular irregular Respiratory: Diminished breath sounds at bases, no wheezing GI: Soft, nontender, +distended  MS: Trace to 1+ edema; No deformity. Neuro:  Nonfocal  Psych: Normal affect   Labs    High Sensitivity Troponin:   Recent Labs  Lab 10/13/21 1550 10/13/21 1849  TROPONINIHS 109* 95*      Chemistry Recent Labs  Lab 10/13/21 1550 10/14/21 0508 10/15/21 0616  NA 133* 134* 136  K 3.7 3.5 3.6  CL 102 102 102  CO2 20* 21* 24  GLUCOSE 159* 143* 126*  BUN 72* 66* 61*  CREATININE 3.62* 3.44* 3.39*  CALCIUM 8.7* 8.1* 8.0*  PROT 6.7  --   --   ALBUMIN 3.5  --   --   AST 20  --   --   ALT 14  --   --   ALKPHOS 86  --   --   BILITOT 3.8*  --   --   GFRNONAA 19* 20* 20*  ANIONGAP 11 11 10     Lipids No results for input(s): "CHOL", "TRIG", "HDL", "LABVLDL", "LDLCALC", "CHOLHDL" in the last 168 hours.  Hematology Recent Labs  Lab 10/13/21 1550 10/14/21 0508  WBC 8.4 10.3  RBC 5.54 5.22  HGB 13.8 13.0  HCT 44.4 41.5  MCV 80.1 79.5*  MCH 24.9* 24.9*  MCHC 31.1 31.3  RDW 19.3* 18.8*  PLT 271 217   Thyroid  Recent Labs  Lab 10/14/21 0508  TSH 3.714    BNP Recent Labs  Lab 10/13/21 1550  BNP 2,587.7*    DDimer No results for input(s): "DDIMER" in the last 168 hours.   Radiology    CT ABDOMEN PELVIS WO CONTRAST  Result Date: 10/14/2021 CLINICAL DATA:  Diffuse abdominal pain. EXAM: CT ABDOMEN AND PELVIS WITHOUT CONTRAST TECHNIQUE: Multidetector CT imaging of the abdomen and pelvis was performed following the standard protocol without IV contrast. RADIATION DOSE REDUCTION: This exam was performed according to the departmental dose-optimization program which includes automated exposure control, adjustment of the mA and/or kV according to patient size and/or use of iterative reconstruction technique. COMPARISON:  MRCP and CT abdomen pelvis dated September 10, 2021. FINDINGS: Lower chest: No acute abnormality. Similar chronic interlobular septal thickening at the lateral costophrenic sulci. Small right lower lobe nodules measuring up to 5 mm, 1 of which are calcified, are unchanged. No follow-up imaging is recommended. Hepatobiliary: No focal liver abnormality. Small gallstones and sludge. No gallbladder wall thickening or biliary dilatation. Pancreas: Unremarkable. No  pancreatic ductal dilatation or surrounding inflammatory changes. Spleen: Normal in size without focal abnormality. Adrenals/Urinary Tract: Adrenal glands are unremarkable. Multiple bilateral renal simple cysts are unchanged. No follow-up imaging is recommended. No renal calculi or hydronephrosis. Chronic circumferential bladder wall thickening is unchanged. Stomach/Bowel: Circumferential rectal wall thickening. Diffuse colonic diverticulosis. The stomach and small bowel are unremarkable. Normal appendix. Vascular/Lymphatic: Aortic atherosclerosis. No enlarged abdominal or pelvic lymph nodes. Reproductive: Prostate is unremarkable. Other: Unchanged small fat containing umbilical and right inguinal hernias. Trace free fluid in the pelvis. No pneumoperitoneum. Musculoskeletal: No acute or significant osseous findings. IMPRESSION: 1. Circumferential rectal wall thickening, concerning for proctitis. 2. Trace free fluid in the pelvis, nonspecific. 3. Cholelithiasis. 4. Aortic Atherosclerosis (ICD10-I70.0). Electronically Signed   By: Titus Dubin M.D.   On: 10/14/2021 14:54   DG Chest Port 1 View  Result Date: 10/13/2021 CLINICAL DATA:  Shortness of breath EXAM: PORTABLE CHEST 1 VIEW COMPARISON:  09/28/2021, 02/26/2021 FINDINGS: Cardiomegaly with vascular congestion. No consolidation, pleural effusion, or pneumothorax. IMPRESSION: Cardiomegaly with vascular congestion. Electronically Signed   By: Donavan Foil M.D.   On: 10/13/2021 15:29    Cardiac Studies   TTE 09/29/2021 1. Left ventricular ejection fraction, by estimation, is <20%. The left  ventricle has severely decreased function. The left ventricle has no  regional wall motion abnormalities. The left ventricular internal cavity  size was moderately dilated. Left  ventricular diastolic parameters are indeterminate.   2. Right ventricular systolic function is mildly reduced. The right  ventricular size is normal. Tricuspid regurgitation signal is  inadequate  for assessing PA pressure.   3. Left atrial size was moderately dilated.   4. Right atrial size was mildly dilated.   5. The mitral valve is normal in structure. Moderate mitral valve  regurgitation. No evidence of mitral stenosis.   6. The aortic valve is normal in structure. Aortic valve regurgitation is  not visualized. Aortic valve sclerosis/calcification is present, without  any evidence of aortic stenosis.   7. The inferior vena cava is dilated in size with <50% respiratory  variability, suggesting right atrial pressure of 15 mmHg.   Patient Profile     57 y.o. male with history of nonischemic cardiomyopathy EF less than 20%, paroxysmal A-fib/flutter, hypertension, CKD 4 presenting with worsening shortness of breath, edema being seen for CHF exacerbation.  Assessment & Plan    NICM, CHF ex, EF less than 20% -Likely from med noncompliance -Improving with diuresing, net -2.8 L over the past 24 hours -Continue IV Lasix 80 mg  twice daily -Continue Coreg, hydralazine, isosorbide, Farxiga. -Consider starting torsemide 40 twice daily on discharge.(Was previously on torsemide 40 mg daily)  2.  Paroxysmal atrial fibrillation -Currently in A-fib, heart rate controlled -Continue Coreg, Eliquis  3.  CKD 4 -Creatinine stable -Monitor with diuresis  Total encounter time more than 50 minutes  Greater than 50% was spent in counseling and coordination of care with the patient     Signed, Kate Sable, MD  10/15/2021, 10:42 AM

## 2021-10-15 NOTE — Progress Notes (Signed)
Mobility Specialist - Progress Note  Pre-mobility: 97 SpO2 During mobility: 96 SpO2 Post-mobility: 97 SPO2   10/15/21 1010  Mobility  Activity Ambulated independently in room;Stood at bedside;Dangled on edge of bed  Level of Assistance Standby assist, set-up cues, supervision of patient - no hands on  Assistive Device None  Distance Ambulated (ft) 40 ft  Activity Response Tolerated well  $Mobility charge 1 Mobility   Pt supine in bed on RA upon arrival. Pt STS and ambulates in room SBA. (No hands on) Pt does have 1 LOB and tendency to furniture cruise. Pt returns to bed with needs in reach.   Gretchen Short  Mobility Specialist  10/15/21 10:14 AM

## 2021-10-15 NOTE — Consult Note (Addendum)
   Heart Failure Nurse Navigator Note  HFrEF <20%.  Ventricular internal cavity moderately dilated.  Indeterminate diastolic parameters of the left ventricle.  Right ventricle systolic function is mildly reduced.  Left atrium is moderately dilated.  Right atria mildly dilated.  Moderate mitral regurgitation.  He presented to the emergency room with worsening shortness of breath, abdominal distention, PND and orthopnea.  He was last discharged from the hospital on October 02, 2021.  Chest x-ray revealed vascular congestion.  BNP was 2587.  Comorbidities:  Chronic kidney disease stage III Hypertension Hyperlipidemia Paroxysmal atrial fib/flutter flutter Hypothyroidism   Medications:  Apixaban 5 mg 2 times a day Atorvastatin 80 mg daily Carvedilol 6.25 mg 2 times a day with meals Farxiga 10 mg daily Furosemide 80 mg IV 2 times a day Hydralazine 25 mg 3 times a day Isosorbide dinitrate 5 mg 2 times a day Levothyroxine 100 mcg daily  Labs:  TSH 3.714, sodium 136, potassium 3.6, chloride 102, CO2 24, BUN 61, creatinine 3.39, estimated GFR 20 Weight is not documented Blood pressure 105/86 Intake 480,mL Output 3280 mL   Initial meeting with patient on this admission.  Sitting on the edge of the bed just finishing his breakfast.  He states that he remembers me from the previous admission earlier this month as I was the heart failure nurse that gave him the scale for weighing himself at home.  I asked him how that was going with weighing himself daily and he said "great".  Then asked him how that works if he told the cardiology nurse practitioner yesterday that it was still in the box.  Discussed with patient the importance of daily weights reporting 2 to 3 pound weight gain overnight or 5 pounds within the week.  I then asked him how  long wanted to live, to what  age?  He told me he would" like to live to age 56."  Then discussed if he does not start taking better care of himself that  he will not live to 56.  Explained that if he does not get serious about taking his medications as directed, weighing himself, sticking with the fluid restriction and sodium restriction that there  is going to come a time that when he comes into the hospital we will not  is not be able to fix him.  Asked about his follow-up with nephrology, he states that he had an appointment with them yesterday but that he was here in the hospital.  States he also had an appointment with PCP today and remains in the hospital.  Discussed options of if he is not hungry to just even eat a piece of toast  or salt free crackers, glass of milk, so he is able to take his morning medications.  He voices understanding.  He has follow-up in the outpatient clinic on August 31 at 2 PM.  He has a 10% no-show which is 11 out of 115 appointments.  We will continue to follow along.  Pricilla Riffle RN [facility]

## 2021-10-15 NOTE — Progress Notes (Signed)
Cardiology Progress Note   Patient Name: Hector [name] Date of Encounter: 10/15/2021  Primary Cardiologist: Ida Rogue, MD  Subjective   No dyspnea @ rest.  Good response to lasix though continues to note increase in abdominal girth.  Not quite back to baseline.  Inpatient Medications    Scheduled Meds:  allopurinol  50 mg Oral Daily   apixaban  5 mg Oral BID   atorvastatin  80 mg Oral Daily   carvedilol  6.25 mg Oral BID WC   dapagliflozin propanediol  10 mg Oral QAC breakfast   furosemide  80 mg Intravenous BID   hydrALAZINE  25 mg Oral TID   isosorbide dinitrate  5 mg Oral TID   levothyroxine  100 mcg Oral QAC breakfast   Continuous Infusions:  PRN Meds: acetaminophen **OR** acetaminophen, ondansetron **OR** ondansetron (ZOFRAN) IV, traZODone   Vital Signs    Vitals:   10/15/21 0035 10/15/21 0522 10/15/21 0600 10/15/21 0732  BP: 96/76 93/70 107/86 105/86  Pulse: (!) 54 (!) 103 100 99  Resp: 18 18 18 14   Temp: 97.8 F (36.6 C) 97.8 F (36.6 C)  97.6 F (36.4 C)  TempSrc: Oral   Oral  SpO2: 97% (!) 89% 95% (!) 87%  Weight:      Height:        Intake/Output Summary (Last 24 hours) at 10/15/2021 0817 Last data filed at 10/15/2021 0600 Gross per 24 hour  Intake 240 ml  Output 3325 ml  Net -3085 ml   Filed Weights   10/13/21 1821 10/14/21 0300  Weight: 111.4 kg 113.3 kg    Physical Exam   GEN: Well nourished, well developed, in no acute distress.  HEENT: Grossly normal.  Neck: Supple, JVD to jaw.  No bruits or masses. Cardiac: Irregularly irregular, mildly tachycardic, no murmurs, rubs, or gallops. No clubbing, cyanosis, trace bilateral ankle edema.  Radials 2+, DP/PT 2+ and equal bilaterally.  Respiratory:  Respirations regular and unlabored, clear to auscultation bilaterally. GI: Obese.  Softer than yesterday.  Soft, nontender, nondistended, BS + x 4. MS: no deformity or atrophy. Skin: warm and dry, no rash. Neuro:  Strength and sensation  are intact. Psych: AAOx3.  Normal affect.  Labs    Chemistry Recent Labs  Lab 10/13/21 1550 10/14/21 0508 10/15/21 0616  NA 133* 134* 136  K 3.7 3.5 3.6  CL 102 102 102  CO2 20* 21* 24  GLUCOSE 159* 143* 126*  BUN 72* 66* 61*  CREATININE 3.62* 3.44* 3.39*  CALCIUM 8.7* 8.1* 8.0*  PROT 6.7  --   --   ALBUMIN 3.5  --   --   AST 20  --   --   ALT 14  --   --   ALKPHOS 86  --   --   BILITOT 3.8*  --   --   GFRNONAA 19* 20* 20*  ANIONGAP 11 11 10      Hematology Recent Labs  Lab 10/13/21 1550 10/14/21 0508  WBC 8.4 10.3  RBC 5.54 5.22  HGB 13.8 13.0  HCT 44.4 41.5  MCV 80.1 79.5*  MCH 24.9* 24.9*  MCHC 31.1 31.3  RDW 19.3* 18.8*  PLT 271 217    Cardiac Enzymes  Recent Labs  Lab 10/13/21 1550 10/13/21 1849  TROPONINIHS 109* 95*      BNP    Component Value Date/Time   BNP 2,587.7 (H) 10/13/2021 1550   Lipids  Lab Results  Component Value Date   CHOL  89 02/03/2020   HDL 15 (L) 02/03/2020   LDLCALC 49 02/03/2020   TRIG 125 02/03/2020   CHOLHDL 5.9 02/03/2020    HbA1c  Lab Results  Component Value Date   HGBA1C 7.7 (H) 12/23/2019    Radiology    CT ABDOMEN PELVIS WO CONTRAST  Result Date: 10/14/2021 CLINICAL DATA:  Diffuse abdominal pain. EXAM: CT ABDOMEN AND PELVIS WITHOUT CONTRAST TECHNIQUE: Multidetector CT imaging of the abdomen and pelvis was performed following the standard protocol without IV contrast. RADIATION DOSE REDUCTION: This exam was performed according to the departmental dose-optimization program which includes automated exposure control, adjustment of the mA and/or kV according to patient size and/or use of iterative reconstruction technique. COMPARISON:  MRCP and CT abdomen pelvis dated September 10, 2021. FINDINGS: Lower chest: No acute abnormality. Similar chronic interlobular septal thickening at the lateral costophrenic sulci. Small right lower lobe nodules measuring up to 5 mm, 1 of which are calcified, are unchanged. No follow-up  imaging is recommended. Hepatobiliary: No focal liver abnormality. Small gallstones and sludge. No gallbladder wall thickening or biliary dilatation. Pancreas: Unremarkable. No pancreatic ductal dilatation or surrounding inflammatory changes. Spleen: Normal in size without focal abnormality. Adrenals/Urinary Tract: Adrenal glands are unremarkable. Multiple bilateral renal simple cysts are unchanged. No follow-up imaging is recommended. No renal calculi or hydronephrosis. Chronic circumferential bladder wall thickening is unchanged. Stomach/Bowel: Circumferential rectal wall thickening. Diffuse colonic diverticulosis. The stomach and small bowel are unremarkable. Normal appendix. Vascular/Lymphatic: Aortic atherosclerosis. No enlarged abdominal or pelvic lymph nodes. Reproductive: Prostate is unremarkable. Other: Unchanged small fat containing umbilical and right inguinal hernias. Trace free fluid in the pelvis. No pneumoperitoneum. Musculoskeletal: No acute or significant osseous findings. IMPRESSION: 1. Circumferential rectal wall thickening, concerning for proctitis. 2. Trace free fluid in the pelvis, nonspecific. 3. Cholelithiasis. 4. Aortic Atherosclerosis (ICD10-I70.0). Electronically Signed   By: Titus Dubin M.D.   On: 10/14/2021 14:54   DG Chest Port 1 View  Result Date: 10/13/2021 CLINICAL DATA:  Shortness of breath EXAM: PORTABLE CHEST 1 VIEW COMPARISON:  09/28/2021, 02/26/2021 FINDINGS: Cardiomegaly with vascular congestion. No consolidation, pleural effusion, or pneumothorax. IMPRESSION: Cardiomegaly with vascular congestion. Electronically Signed   By: Donavan Foil M.D.   On: 10/13/2021 15:29    Telemetry    Afib/flutter, 90's to low 100's,  - Personally Reviewed  Cardiac Studies   2D Echocardiogram 8.7.2023  1. Left ventricular ejection fraction, by estimation, is <20%. The left  ventricle has severely decreased function. The left ventricle has no  regional wall motion  abnormalities. The left ventricular internal cavity  size was moderately dilated. Left  ventricular diastolic parameters are indeterminate.   2. Right ventricular systolic function is mildly reduced. The right  ventricular size is normal. Tricuspid regurgitation signal is inadequate  for assessing PA pressure.   3. Left atrial size was moderately dilated.   4. Right atrial size was mildly dilated.   5. The mitral valve is normal in structure. Moderate mitral valve  regurgitation. No evidence of mitral stenosis.   6. The aortic valve is normal in structure. Aortic valve regurgitation is  not visualized. Aortic valve sclerosis/calcification is present, without  any evidence of aortic stenosis.   7. The inferior vena cava is dilated in size with <50% respiratory  variability, suggesting right atrial pressure of 15 mmHg.   Patient Profile     56 y.o. 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  He was recently admitted in early August with recurrent atrial arrhythmias and heart failure, and subsequently diuresed and discharged home at 111 kg on August 10.  Echo during that admission showed an EF of less than 20%.  Following discharge, he has been missing his morning medications as he likes to take them with breakfast, but has been skipping breakfast.  In that setting, he noted increasing abdominal girth and lower extremity edema.  He presented back to the  emergency department on August 21, where BNP was elevated at 2587.7 and chest x-ray showed cardiomegaly and vascular congestion.  He remains in atrial fibrillation/flutter.  We increase Lasix to 80 mg IV twice daily on August 22, and he has responded well.  He is minus 2.8 L overnight and minus 3.2 L since admission.  He has some improvement in breathing and abdominal girth, though notes that he is not back to baseline.  His renal function is relatively stable.  Continue aggressive IV dye uresis today.  Continue beta-blocker, hydralazine, nitrate, and SGLT2 inhibitor.  Follow renal function closely, as may need to discontinue SGLT2 inhibitor for further worsening.  2.  Persistent atrial fibrillation/flutter: On presentation August 6, ECG showed sinus rhythm with PACs.  He was subsequently noted to be in A-fib/flutter on telemetry throughout that hospitalization, which has been managed with beta-blocker.  He is anticoagulated with Eliquis though compliance is poor, as he admits to regularly skipping the morning doses.  As previously noted, he did not tolerate amiodarone secondary to elevation of thyroid function tests, and heart failure and chronic kidney disease limited to multiple other agents.  He was previously evaluated by EP but not felt to be a candidate for catheter ablation in the setting of noncompliance.  Continue rate control and anticoagulation for now.  Pending compliance, he will need outpatient EP reevaluation for consideration of biventricular pacing versus ICD placement and AV nodal ablation.  3.  Stage IV chronic kidney disease: Stable.  Follow.  4.  Essential hypertension: Stable.  5.  Nonobstructive CAD/demand ischemia: Minor irregularities on catheterization in 2016.  Mild troponin elevation in the setting of low output heart failure likely related to demand ischemia.  Continue beta-blocker and statin therapy.  No aspirin in the setting of Eliquis.  6.  Hyperlipidemia: Continue statin  therapy.  7.  Obstructive sleep apnea: Reportedly uses CPAP at home.  8.  Hypothyroidism: On Synthroid with normal TSH.  9.  Type 2 diabetes mellitus: A1c 7.7 in 2021.  Continue SGLT2 inhibitor.  Insulin management per medicine team.  Signed, Murray Hodgkins, NP  10/15/2021, 8:17 AM    For questions or updates, please contact   Please consult [email or web address] for contact info under Cardiology/STEMI.

## 2021-10-16 DIAGNOSIS — I428 Other cardiomyopathies: Secondary | ICD-10-CM

## 2021-10-16 DIAGNOSIS — I4891 Unspecified atrial fibrillation: Secondary | ICD-10-CM | POA: Diagnosis not present

## 2021-10-16 DIAGNOSIS — I5023 Acute on chronic systolic (congestive) heart failure: Secondary | ICD-10-CM | POA: Diagnosis not present

## 2021-10-16 DIAGNOSIS — R0603 Acute respiratory distress: Secondary | ICD-10-CM

## 2021-10-16 DIAGNOSIS — I483 Typical atrial flutter: Secondary | ICD-10-CM

## 2021-10-16 DIAGNOSIS — F1010 Alcohol abuse, uncomplicated: Secondary | ICD-10-CM

## 2021-10-16 DIAGNOSIS — J810 Acute pulmonary edema: Secondary | ICD-10-CM

## 2021-10-16 DIAGNOSIS — N184 Chronic kidney disease, stage 4 (severe): Secondary | ICD-10-CM | POA: Diagnosis not present

## 2021-10-16 LAB — BASIC METABOLIC PANEL
Anion gap: 10 (ref 5–15)
BUN: 60 mg/dL — ABNORMAL HIGH (ref 6–20)
CO2: 23 mmol/L (ref 22–32)
Calcium: 7.9 mg/dL — ABNORMAL LOW (ref 8.9–10.3)
Chloride: 102 mmol/L (ref 98–111)
Creatinine, Ser: 3.54 mg/dL — ABNORMAL HIGH (ref 0.61–1.24)
GFR, Estimated: 19 mL/min — ABNORMAL LOW (ref >60)
Glucose, Bld: 142 mg/dL — ABNORMAL HIGH (ref 70–99)
Potassium: 3.7 mmol/L (ref 3.5–5.1)
Sodium: 135 mmol/L (ref 135–145)

## 2021-10-16 LAB — CBC
HCT: 40.8 % (ref 39.0–52.0)
Hemoglobin: 12.7 g/dL — ABNORMAL LOW (ref 13.0–17.0)
MCH: 24.9 pg — ABNORMAL LOW (ref 26.0–34.0)
MCHC: 31.1 g/dL (ref 30.0–36.0)
MCV: 80 fL (ref 80.0–100.0)
Platelets: 235 10*3/uL (ref 150–400)
RBC: 5.1 MIL/uL (ref 4.22–5.81)
RDW: 19.2 % — ABNORMAL HIGH (ref 11.5–15.5)
WBC: 8.6 10*3/uL (ref 4.0–10.5)
nRBC: 0 % (ref 0.0–0.2)

## 2021-10-16 NOTE — Plan of Care (Signed)

## 2021-10-16 NOTE — Progress Notes (Signed)
PROGRESS NOTE    Hector [name]  KVQ:[id number] DOB: 16-Apr-1965 DOA: 10/13/2021 PCP: [name], MD    Assessment & Plan:   Principal Problem:   Acute on chronic systolic (congestive) heart failure (HCC) Active Problems:   Nonischemic cardiomyopathy: EF 20-25%   Atrial fibrillation with rapid ventricular response (HCC)   Hypothyroidism   Essential hypertension   Obstructive sleep apnea   CKD (chronic kidney disease) stage 4, GFR 15-29 ml/min (HCC)   Stroke (HCC)   Alcohol abuse   Obesity (BMI 30-39.9)   Dyslipidemia  Assessment and Plan: Acute on chronic combined CHF: continue on IV lasix. Monitor I/Os. CXR shows vascular congestion. Continue on imdur, coreg, farxiga    Likely PAF: continue on eliquis, coreg    Abdominal pain: CT shows likely proctitis & cholelithiasis. Denies abd pain today so far. No fevers, normal WBC    HTN: continue on hydralazine, coreg    OSA: CPAP qhs    Hx CVA: continue on eliquis, statin    CKDIV: Cr is labile. Avoid nephrotoxic meds    Hx lower GI bleed: H&H are WNL   Obesity: BMI 34.9. Complicates overall care & prognosis   DVT prophylaxis: eliquis  Code Status: full  Family Communication:  Disposition Plan: likely d/c back home  Level of care: Progressive  Status is: Inpatient Remains inpatient appropriate because: severity of illness    Consultants:  Cardio   Procedures:   Antimicrobials:   Subjective: Pt c/o fatigue   Objective: Vitals:   10/16/21 0001 10/16/21 0406 10/16/21 0744 10/16/21 1139  BP: 98/81 99/84 (!) 116/96 108/76  Pulse: 74 (!) 56 (!) 101 (!) 101  Resp: 20 18 14 19   Temp: 97.8 F (36.6 C) 97.6 F (36.4 C) 98.4 F (36.9 C) 97.8 F (36.6 C)  TempSrc: Oral Oral Oral   SpO2: 98% 97% 97% 98%  Weight:  113.8 kg    Height:        Intake/Output Summary (Last 24 hours) at 10/16/2021 1445 Last data filed at 10/16/2021 1418 Gross per 24 hour  Intake 1434 ml  Output 1410 ml  Net 24  ml   Filed Weights   10/13/21 1821 10/14/21 0300 10/16/21 0406  Weight: 111.4 kg 113.3 kg 113.8 kg    Examination:  General exam: Appears comfortable  Respiratory system: decreased breaths sounds b/l Cardiovascular system: irregularly irregular Gastrointestinal system: Abd is soft, NT, obese & normal bowel sounds  Central nervous system: Alert and oriented. Moves all extremities  Psychiatry: Judgement and insight appears normal. Flat mood and affect    Data Reviewed: I have personally reviewed following labs and imaging studies  CBC: Recent Labs  Lab 10/13/21 1550 10/14/21 0508 10/16/21 0443  WBC 8.4 10.3 8.6  NEUTROABS 5.5  --   --   HGB 13.8 13.0 12.7*  HCT 44.4 41.5 40.8  MCV 80.1 79.5* 80.0  PLT 271 217 643   Basic Metabolic Panel: Recent Labs  Lab 10/13/21 1550 10/14/21 0508 10/15/21 0616 10/16/21 0443  NA 133* 134* 136 135  K 3.7 3.5 3.6 3.7  CL 102 102 102 102  CO2 20* 21* 24 23  GLUCOSE 159* 143* 126* 142*  BUN 72* 66* 61* 60*  CREATININE 3.62* 3.44* 3.39* 3.54*  CALCIUM 8.7* 8.1* 8.0* 7.9*   GFR: Estimated Creatinine Clearance: 29.5 mL/min (A) (by C-G formula based on SCr of 3.54 mg/dL (H)). Liver Function Tests: Recent Labs  Lab 10/13/21 1550  AST 20  ALT 14  ALKPHOS 86  BILITOT 3.8*  PROT 6.7  ALBUMIN 3.5   No results for input(s): "LIPASE", "AMYLASE" in the last 168 hours. No results for input(s): "AMMONIA" in the last 168 hours. Coagulation Profile: No results for input(s): "INR", "PROTIME" in the last 168 hours. Cardiac Enzymes: No results for input(s): "CKTOTAL", "CKMB", "CKMBINDEX", "TROPONINI" in the last 168 hours. BNP (last 3 results) No results for input(s): "PROBNP" in the last 8760 hours. HbA1C: No results for input(s): "HGBA1C" in the last 72 hours. CBG: No results for input(s): "GLUCAP" in the last 168 hours. Lipid Profile: No results for input(s): "CHOL", "HDL", "LDLCALC", "TRIG", "CHOLHDL", "LDLDIRECT" in the last 72  hours. Thyroid Function Tests: Recent Labs    10/14/21 0508  TSH 3.714   Anemia Panel: No results for input(s): "VITAMINB12", "FOLATE", "FERRITIN", "TIBC", "IRON", "RETICCTPCT" in the last 72 hours. Sepsis Labs: No results for input(s): "PROCALCITON", "LATICACIDVEN" in the last 168 hours.  Recent Results (from the past 240 hour(s))  Resp Panel by RT-PCR (Flu A&B, Covid) Anterior Nasal Swab     Status: None   Collection Time: 10/13/21  3:50 PM   Specimen: Anterior Nasal Swab  Result Value Ref Range Status   SARS Coronavirus 2 by RT PCR NEGATIVE NEGATIVE Final    Comment: (NOTE) SARS-CoV-2 target nucleic acids are NOT DETECTED.  The SARS-CoV-2 RNA is generally detectable in upper respiratory specimens during the acute phase of infection. The lowest concentration of SARS-CoV-2 viral copies this assay can detect is 138 copies/mL. A negative result does not preclude SARS-Cov-2 infection and should not be used as the sole basis for treatment or other patient management decisions. A negative result may occur with  improper specimen collection/handling, submission of specimen other than nasopharyngeal swab, presence of viral mutation(s) within the areas targeted by this assay, and inadequate number of viral copies(<138 copies/mL). A negative result must be combined with clinical observations, patient history, and epidemiological information. The expected result is Negative.  Fact Sheet for Patients:  EntrepreneurPulse.com.au  Fact Sheet for Healthcare Providers:  IncredibleEmployment.be  This test is no t yet approved or cleared by the [place] FDA and  has been authorized for detection and/or diagnosis of SARS-CoV-2 by FDA under an Emergency Use Authorization (EUA). This EUA will remain  in effect (meaning this test can be used) for the duration of the COVID-19 declaration under Section 564(b)(1) of the Act, 21 U.S.C.section  360bbb-3(b)(1), unless the authorization is terminated  or revoked sooner.       Influenza A by PCR NEGATIVE NEGATIVE Final   Influenza B by PCR NEGATIVE NEGATIVE Final    Comment: (NOTE) The Xpert Xpress SARS-CoV-2/FLU/RSV plus assay is intended as an aid in the diagnosis of influenza from Nasopharyngeal swab specimens and should not be used as a sole basis for treatment. Nasal washings and aspirates are unacceptable for Xpert Xpress SARS-CoV-2/FLU/RSV testing.  Fact Sheet for Patients: EntrepreneurPulse.com.au  Fact Sheet for Healthcare Providers: IncredibleEmployment.be  This test is not yet approved or cleared by the [place] FDA and has been authorized for detection and/or diagnosis of SARS-CoV-2 by FDA under an Emergency Use Authorization (EUA). This EUA will remain in effect (meaning this test can be used) for the duration of the COVID-19 declaration under Section 564(b)(1) of the Act, 21 U.S.C. section 360bbb-3(b)(1), unless the authorization is terminated or revoked.  Performed at [facility], [address] [place], [place] [address]          Radiology Studies:  No results found.      Scheduled Meds:  allopurinol  50 mg Oral Daily   apixaban  5 mg Oral BID   atorvastatin  80 mg Oral Daily   carvedilol  6.25 mg Oral BID WC   dapagliflozin propanediol  10 mg Oral QAC breakfast   furosemide  80 mg Intravenous BID   hydrALAZINE  25 mg Oral TID   isosorbide dinitrate  5 mg Oral TID   levothyroxine  100 mcg Oral QAC breakfast   Continuous Infusions:   LOS: 3 days    Time spent: 25 mins     Wyvonnia Dusky, MD [facility] Pager [phone]  If 7PM-7AM, please contact night-coverage [email or web address] 10/16/2021, 2:45 PM

## 2021-10-16 NOTE — Plan of Care (Signed)
  Problem: Education: Goal: Knowledge of General Education information will improve Description: Including pain rating scale, medication(s)/side effects and non-pharmacologic comfort measures 10/16/2021 2241 by Liliane Channel, RN Outcome: Not Progressing Note: Pt non complaint with fluid intake 10/16/2021 2241 by Liliane Channel, RN Outcome: Not Progressing   Problem: Clinical Measurements: Goal: Cardiovascular complication will be avoided 10/16/2021 2241 by Liliane Channel, RN Outcome: Progressing 10/16/2021 2239 by Liliane Channel, RN Outcome: Progressing   Problem: Nutrition: Goal: Adequate nutrition will be maintained Outcome: Progressing   Problem: Pain Managment: Goal: General experience of comfort will improve 10/16/2021 2241 by Liliane Channel, RN Outcome: Progressing 10/16/2021 2239 by Liliane Channel, RN Outcome: Progressing

## 2021-10-16 NOTE — Progress Notes (Signed)
Progress Note  Patient Name: Hector [name] Date of Encounter: 10/16/2021  [facility] Cardiologist: Ida Rogue, MD   Subjective   Resting comfortably this morning Reports medication compliance this morning but notes from yesterday indicating that he is missing doses of his morning medications as he will skip breakfast BMP 2600 chest x-ray with vascular congestion Remains in atrial fibrillation/flutter Has responded well to Lasix 80 IV twice daily past 2 days -3.3 L total Stable renal failure noted creatinine 3.5  Inpatient Medications    Scheduled Meds:  allopurinol  50 mg Oral Daily   apixaban  5 mg Oral BID   atorvastatin  80 mg Oral Daily   carvedilol  6.25 mg Oral BID WC   dapagliflozin propanediol  10 mg Oral QAC breakfast   furosemide  80 mg Intravenous BID   hydrALAZINE  25 mg Oral TID   isosorbide dinitrate  5 mg Oral TID   levothyroxine  100 mcg Oral QAC breakfast   Continuous Infusions:  PRN Meds: acetaminophen **OR** acetaminophen, ondansetron **OR** ondansetron (ZOFRAN) IV, oxyCODONE-acetaminophen, traZODone   Vital Signs    Vitals:   10/15/21 2041 10/16/21 0001 10/16/21 0406 10/16/21 0744  BP: (!) 111/95 98/81 99/84  (!) 116/96  Pulse:  74 (!) 56 (!) 101  Resp: 20 20 18 14   Temp: 97.7 F (36.5 C) 97.8 F (36.6 C) 97.6 F (36.4 C) 98.4 F (36.9 C)  TempSrc: Oral Oral Oral Oral  SpO2: 96% 98% 97% 97%  Weight:   113.8 kg   Height:        Intake/Output Summary (Last 24 hours) at 10/16/2021 1140 Last data filed at 10/16/2021 1048 Gross per 24 hour  Intake 1394 ml  Output 1630 ml  Net -236 ml      10/16/2021    4:06 AM 10/14/2021    3:00 AM 10/13/2021    6:21 PM  Last 3 Weights  Weight (lbs) 250 lb 12.8 oz 249 lb 12.5 oz 245 lb 8 oz  Weight (kg) 113.762 kg 113.3 kg 111.358 kg      Telemetry   Atrial fibrillation/flutter- Personally Reviewed  ECG     - Personally Reviewed  Physical Exam   GEN: No acute distress.   Neck:  Unable to estimate JVD Cardiac: Irregularly irregular, no murmurs, rubs, or gallops.  Respiratory: Clear to auscultation bilaterally.,  Scattered [place] GI: Soft, nontender, non-distended  MS: No edema; No deformity. Neuro:  Nonfocal  Psych: Normal affect   Labs    High Sensitivity Troponin:   Recent Labs  Lab 10/13/21 1550 10/13/21 1849  TROPONINIHS 109* 95*     Chemistry Recent Labs  Lab 10/13/21 1550 10/14/21 0508 10/15/21 0616 10/16/21 0443  NA 133* 134* 136 135  K 3.7 3.5 3.6 3.7  CL 102 102 102 102  CO2 20* 21* 24 23  GLUCOSE 159* 143* 126* 142*  BUN 72* 66* 61* 60*  CREATININE 3.62* 3.44* 3.39* 3.54*  CALCIUM 8.7* 8.1* 8.0* 7.9*  PROT 6.7  --   --   --   ALBUMIN 3.5  --   --   --   AST 20  --   --   --   ALT 14  --   --   --   ALKPHOS 86  --   --   --   BILITOT 3.8*  --   --   --   GFRNONAA 19* 20* 20* 19*  ANIONGAP 11 11 10  10  Lipids No results for input(s): "CHOL", "TRIG", "HDL", "LABVLDL", "LDLCALC", "CHOLHDL" in the last 168 hours.  Hematology Recent Labs  Lab 10/13/21 1550 10/14/21 0508 10/16/21 0443  WBC 8.4 10.3 8.6  RBC 5.54 5.22 5.10  HGB 13.8 13.0 12.7*  HCT 44.4 41.5 40.8  MCV 80.1 79.5* 80.0  MCH 24.9* 24.9* 24.9*  MCHC 31.1 31.3 31.1  RDW 19.3* 18.8* 19.2*  PLT 271 217 235   Thyroid  Recent Labs  Lab 10/14/21 0508  TSH 3.714    BNP Recent Labs  Lab 10/13/21 1550  BNP 2,587.7*    DDimer No results for input(s): "DDIMER" in the last 168 hours.   Radiology    CT ABDOMEN PELVIS WO CONTRAST  Result Date: 10/14/2021 CLINICAL DATA:  Diffuse abdominal pain. EXAM: CT ABDOMEN AND PELVIS WITHOUT CONTRAST TECHNIQUE: Multidetector CT imaging of the abdomen and pelvis was performed following the standard protocol without IV contrast. RADIATION DOSE REDUCTION: This exam was performed according to the departmental dose-optimization program which includes automated exposure control, adjustment of the mA and/or kV according to patient  size and/or use of iterative reconstruction technique. COMPARISON:  MRCP and CT abdomen pelvis dated September 10, 2021. FINDINGS: Lower chest: No acute abnormality. Similar chronic interlobular septal thickening at the lateral costophrenic sulci. Small right lower lobe nodules measuring up to 5 mm, 1 of which are calcified, are unchanged. No follow-up imaging is recommended. Hepatobiliary: No focal liver abnormality. Small gallstones and sludge. No gallbladder wall thickening or biliary dilatation. Pancreas: Unremarkable. No pancreatic ductal dilatation or surrounding inflammatory changes. Spleen: Normal in size without focal abnormality. Adrenals/Urinary Tract: Adrenal glands are unremarkable. Multiple bilateral renal simple cysts are unchanged. No follow-up imaging is recommended. No renal calculi or hydronephrosis. Chronic circumferential bladder wall thickening is unchanged. Stomach/Bowel: Circumferential rectal wall thickening. Diffuse colonic diverticulosis. The stomach and small bowel are unremarkable. Normal appendix. Vascular/Lymphatic: Aortic atherosclerosis. No enlarged abdominal or pelvic lymph nodes. Reproductive: Prostate is unremarkable. Other: Unchanged small fat containing umbilical and right inguinal hernias. Trace free fluid in the pelvis. No pneumoperitoneum. Musculoskeletal: No acute or significant osseous findings. IMPRESSION: 1. Circumferential rectal wall thickening, concerning for proctitis. 2. Trace free fluid in the pelvis, nonspecific. 3. Cholelithiasis. 4. Aortic Atherosclerosis (ICD10-I70.0). Electronically Signed   By: Titus Dubin M.D.   On: 10/14/2021 14:54    Cardiac Studies   Echocardiogram September 29, 2021  1. Left ventricular ejection fraction, by estimation, is <20%. The left  ventricle has severely decreased function. The left ventricle has no  regional wall motion abnormalities. The left ventricular internal cavity  size was moderately dilated. Left  ventricular  diastolic parameters are indeterminate.   2. Right ventricular systolic function is mildly reduced. The right  ventricular size is normal. Tricuspid regurgitation signal is inadequate  for assessing PA pressure.   3. Left atrial size was moderately dilated.   4. Right atrial size was mildly dilated.   5. The mitral valve is normal in structure. Moderate mitral valve  regurgitation. No evidence of mitral stenosis.   6. The aortic valve is normal in structure. Aortic valve regurgitation is  not visualized. Aortic valve sclerosis/calcification is present, without  any evidence of aortic stenosis.   7. The inferior vena cava is dilated in size with <50% respiratory  variability, suggesting right atrial pressure of 15 mmHg.   Patient Profile     56 y.o. male with a history of chronic combined syst/diast CHF, NICM presumed to be 2/2 ETOH, paroxysmal atrial  flutter/fib, HTN, HL, DMII, OSA on CPAP, CKD IV, hypothyroidism, medication noncompliance, and GIB on eliquis, who was admitted August 21 with progressive heart failure and atrial fibs/flutter with RVR.  Assessment & Plan    Acute on chronic diastolic and systolic CHF/nonischemic cardiomyopathy Nonischemic cardiomyopathy, prior alcohol Admission several weeks ago for similar presentation discharged home August 10 Since that time reporting has been missing some of his morning medications as he is missing breakfast BNP 2600, chest x-ray vascular congestion Presentation likely exacerbated by underlying renal failure creatinine 3.5 Medication compliance encouraged He may benefit from torsemide 40 twice daily at the time of discharge High risk for readmission in the setting of renal failure and atrial fibrillation/flutter  Persistent atrial fibrillation/flutter On beta-blocker, Eliquis Antiarrhythmic medication options limited in the setting of thyroid disease, heart failure and chronic kidney disease as well as noncompliance Outpatient EP  reevaluation for consideration of biventricular pacing versus ICD placement and AV nodal ablation.   Chronic renal failure stage IV Remained stable, creatinine 3.5 Contributing to CHF exacerbation  Diabetes type 2 SGLT2 inhibitor, insulin  Sleep apnea Stressed the importance of compliance with his CPAP at home   Total encounter time more than 50 minutes  Greater than 50% was spent in counseling and coordination of care with the patient   For questions or updates, please contact [facility] Please consult [email or web address] for contact info under        Signed, Ida Rogue, MD  10/16/2021, 11:40 AM

## 2021-10-16 NOTE — Progress Notes (Signed)
Pt refusing bed alarm but was educated about safety. Will continue to monitor.

## 2021-10-17 DIAGNOSIS — J810 Acute pulmonary edema: Secondary | ICD-10-CM | POA: Diagnosis not present

## 2021-10-17 DIAGNOSIS — I5023 Acute on chronic systolic (congestive) heart failure: Secondary | ICD-10-CM | POA: Diagnosis not present

## 2021-10-17 DIAGNOSIS — R0602 Shortness of breath: Secondary | ICD-10-CM

## 2021-10-17 DIAGNOSIS — I4891 Unspecified atrial fibrillation: Secondary | ICD-10-CM | POA: Diagnosis not present

## 2021-10-17 DIAGNOSIS — F1010 Alcohol abuse, uncomplicated: Secondary | ICD-10-CM | POA: Diagnosis not present

## 2021-10-17 DIAGNOSIS — N184 Chronic kidney disease, stage 4 (severe): Secondary | ICD-10-CM | POA: Diagnosis not present

## 2021-10-17 DIAGNOSIS — I420 Dilated cardiomyopathy: Secondary | ICD-10-CM

## 2021-10-17 LAB — BASIC METABOLIC PANEL
Anion gap: 10 (ref 5–15)
BUN: 56 mg/dL — ABNORMAL HIGH (ref 6–20)
CO2: 26 mmol/L (ref 22–32)
Calcium: 8.4 mg/dL — ABNORMAL LOW (ref 8.9–10.3)
Chloride: 101 mmol/L (ref 98–111)
Creatinine, Ser: 3.29 mg/dL — ABNORMAL HIGH (ref 0.61–1.24)
GFR, Estimated: 21 mL/min — ABNORMAL LOW (ref >60)
Glucose, Bld: 113 mg/dL — ABNORMAL HIGH (ref 70–99)
Potassium: 3.5 mmol/L (ref 3.5–5.1)
Sodium: 137 mmol/L (ref 135–145)

## 2021-10-17 LAB — CBC
HCT: 39.5 % (ref 39.0–52.0)
Hemoglobin: 12.6 g/dL — ABNORMAL LOW (ref 13.0–17.0)
MCH: 25.2 pg — ABNORMAL LOW (ref 26.0–34.0)
MCHC: 31.9 g/dL (ref 30.0–36.0)
MCV: 79 fL — ABNORMAL LOW (ref 80.0–100.0)
Platelets: 236 10*3/uL (ref 150–400)
RBC: 5 MIL/uL (ref 4.22–5.81)
RDW: 19.5 % — ABNORMAL HIGH (ref 11.5–15.5)
WBC: 7.7 10*3/uL (ref 4.0–10.5)
nRBC: 0 % (ref 0.0–0.2)

## 2021-10-17 MED ORDER — FUROSEMIDE 10 MG/ML IJ SOLN
80.0000 mg | INTRAMUSCULAR | Status: AC
  Administered 2021-10-17 – 2021-10-19 (×8): 80 mg via INTRAVENOUS
  Filled 2021-10-17 (×8): qty 8

## 2021-10-17 MED ORDER — POTASSIUM CHLORIDE CRYS ER 20 MEQ PO TBCR
40.0000 meq | EXTENDED_RELEASE_TABLET | Freq: Every day | ORAL | Status: DC
  Administered 2021-10-17 – 2021-10-20 (×4): 40 meq via ORAL
  Filled 2021-10-17 (×4): qty 2

## 2021-10-17 NOTE — Progress Notes (Signed)
Progress Note  Patient Name: Hector [name] Date of Encounter: 10/17/2021  [facility] Cardiologist: Ida Rogue, MD   Subjective   Is resting comfortably Does not feel the Lasix is working for very long, has several urinations then seems to wear out of his system Feels his abdomen is still distended Leg swelling much improved  Inpatient Medications    Scheduled Meds:  allopurinol  50 mg Oral Daily   apixaban  5 mg Oral BID   atorvastatin  80 mg Oral Daily   carvedilol  6.25 mg Oral BID WC   dapagliflozin propanediol  10 mg Oral QAC breakfast   furosemide  80 mg Intravenous BID   hydrALAZINE  25 mg Oral TID   isosorbide dinitrate  5 mg Oral TID   levothyroxine  100 mcg Oral QAC breakfast   Continuous Infusions:  PRN Meds: acetaminophen **OR** acetaminophen, ondansetron **OR** ondansetron (ZOFRAN) IV, oxyCODONE-acetaminophen, traZODone   Vital Signs    Vitals:   10/17/21 0444 10/17/21 0601 10/17/21 0812 10/17/21 1157  BP: (!) 107/92  (!) 109/91 102/84  Pulse: (!) 47  88 (!) 49  Resp: 18  18 18   Temp: 98.3 F (36.8 C)  98.1 F (36.7 C) 98.2 F (36.8 C)  TempSrc:   Oral Oral  SpO2: 93%  94% 93%  Weight:  112.6 kg    Height:        Intake/Output Summary (Last 24 hours) at 10/17/2021 1214 Last data filed at 10/17/2021 0900 Gross per 24 hour  Intake 1276 ml  Output 1885 ml  Net -609 ml      10/17/2021    6:01 AM 10/16/2021    4:06 AM 10/14/2021    3:00 AM  Last 3 Weights  Weight (lbs) 248 lb 3.8 oz 250 lb 12.8 oz 249 lb 12.5 oz  Weight (kg) 112.6 kg 113.762 kg 113.3 kg      Telemetry    Atrial fibrillation/flutter- Personally Reviewed  ECG     - Personally Reviewed  Physical Exam   GEN: No acute distress.   Neck: No JVD Cardiac: Irregularly irregular, no murmurs, rubs, or gallops.  Respiratory: Clear to auscultation bilaterally. GI: Soft, nontender, non-distended  MS: No edema; No deformity. Neuro:  Nonfocal  Psych: Normal affect    Labs    High Sensitivity Troponin:   Recent Labs  Lab 10/13/21 1550 10/13/21 1849  TROPONINIHS 109* 95*     Chemistry Recent Labs  Lab 10/13/21 1550 10/14/21 0508 10/15/21 0616 10/16/21 0443 10/17/21 0522  NA 133*   < > 136 135 137  K 3.7   < > 3.6 3.7 3.5  CL 102   < > 102 102 101  CO2 20*   < > 24 23 26   GLUCOSE 159*   < > 126* 142* 113*  BUN 72*   < > 61* 60* 56*  CREATININE 3.62*   < > 3.39* 3.54* 3.29*  CALCIUM 8.7*   < > 8.0* 7.9* 8.4*  PROT 6.7  --   --   --   --   ALBUMIN 3.5  --   --   --   --   AST 20  --   --   --   --   ALT 14  --   --   --   --   ALKPHOS 86  --   --   --   --   BILITOT 3.8*  --   --   --   --  GFRNONAA 19*   < > 20* 19* 21*  ANIONGAP 11   < > 10 10 10    < > = values in this interval not displayed.    Lipids No results for input(s): "CHOL", "TRIG", "HDL", "LABVLDL", "LDLCALC", "CHOLHDL" in the last 168 hours.  Hematology Recent Labs  Lab 10/14/21 0508 10/16/21 0443 10/17/21 0522  WBC 10.3 8.6 7.7  RBC 5.22 5.10 5.00  HGB 13.0 12.7* 12.6*  HCT 41.5 40.8 39.5  MCV 79.5* 80.0 79.0*  MCH 24.9* 24.9* 25.2*  MCHC 31.3 31.1 31.9  RDW 18.8* 19.2* 19.5*  PLT 217 235 236   Thyroid  Recent Labs  Lab 10/14/21 0508  TSH 3.714    BNP Recent Labs  Lab 10/13/21 1550  BNP 2,587.7*    DDimer No results for input(s): "DDIMER" in the last 168 hours.   Radiology    No results found.  Cardiac Studies  Echo 10/30/21 1. Left ventricular ejection fraction, by estimation, is <20%. The left  ventricle has severely decreased function. The left ventricle has no  regional wall motion abnormalities. The left ventricular internal cavity  size was moderately dilated. Left  ventricular diastolic parameters are indeterminate.   2. Right ventricular systolic function is mildly reduced. The right  ventricular size is normal. Tricuspid regurgitation signal is inadequate  for assessing PA pressure.   3. Left atrial size was moderately dilated.    4. Right atrial size was mildly dilated.   5. The mitral valve is normal in structure. Moderate mitral valve  regurgitation. No evidence of mitral stenosis.   6. The aortic valve is normal in structure. Aortic valve regurgitation is  not visualized. Aortic valve sclerosis/calcification is present, without  any evidence of aortic stenosis.   7. The inferior vena cava is dilated in size with <50% respiratory  variability, suggesting right atrial pressure of 15 mmHg.   Patient Profile     56 y.o. male with a history of chronic combined syst/diast CHF, NICM presumed to be 2/2 ETOH, paroxysmal atrial flutter/fib, HTN, HL, DMII, OSA on CPAP, CKD IV, hypothyroidism, medication noncompliance, and GIB on eliquis, who was admitted August 21 with progressive heart failure and atrial fibs/flutter with RVR.   Assessment & Plan    Acute on chronic diastolic and systolic CHF/nonischemic cardiomyopathy Nonischemic cardiomyopathy, prior alcohol Admission several weeks ago for similar presentation discharged home August 10 Since that time reporting has been missing some of his morning medications as he is missing breakfast On arrival this admission with worsening shortness of breath, BNP 2600, chest x-ray vascular congestion Presentation likely exacerbated by underlying renal failure creatinine 3.5 Medication compliance encouraged -For now we will continue IV Lasix, increase up to every 8 hours At the time of discharge would consider torsemide 40 twice daily at the time of discharge High risk for readmission in the setting of renal failure and atrial fibrillation/flutter We will replete potassium  Persistent atrial fibrillation/flutter On beta-blocker, Eliquis Antiarrhythmic medication options limited in the setting of thyroid disease, heart failure and chronic kidney disease as well as noncompliance, low blood pressure Previously evaluated by EP   Chronic renal failure stage IV Remained stable,  creatinine 3.5 Contributing to CHF exacerbation Would involve nephrology in his care, close to hemodialysis   Diabetes type 2 SGLT2 inhibitor, insulin   Sleep apnea Stressed the importance of compliance with his CPAP at home   Total encounter time more than 35 minutes  Greater than 50% was spent in counseling and  coordination of care with the patient   For questions or updates, please contact [facility] Please consult [email or web address] for contact info under        Signed, Ida Rogue, MD  10/17/2021, 12:14 PM

## 2021-10-17 NOTE — Progress Notes (Signed)
   Heart Failure Nurse Navigator Note    Meet with patient today.  He was lying on his side in bed, having just finished breakfast.  He states he is still sleepy from the sleeping pill he took last night, and states that" I am getting on his nerves as he knows how to take care of himself at home by  taking the scale out of the box, doing daily weights, taking his medications as ordered by the doctor, eating and drinking as he should."  Congratulated him as by doing all of that he will be able to stay out of the hospital.  Pricilla Riffle RN [facility]

## 2021-10-17 NOTE — Progress Notes (Signed)
PROGRESS NOTE    Hector [name]  NAT:[id number] DOB: 24-Mar-1965 DOA: 10/13/2021 PCP: [name], MD    Assessment & Plan:   Principal Problem:   Acute on chronic systolic (congestive) heart failure (HCC) Active Problems:   Nonischemic cardiomyopathy: EF 20-25%   Atrial fibrillation with rapid ventricular response (HCC)   Hypothyroidism   Essential hypertension   Obstructive sleep apnea   CKD (chronic kidney disease) stage 4, GFR 15-29 ml/min (HCC)   Stroke (HCC)   Alcohol abuse   Obesity (BMI 30-39.9)   Dyslipidemia  Assessment and Plan: Acute on chronic combined CHF: increased IV lasix frequency as cardio. Monitor I/Os. Continue on coreg, imdur, farxiga.   Likely PAF: continue on coreg, eliquis    Abdominal pain: CT shows likely proctitis & cholelithiasis. Denies abd pain today so far. No fevers, normal WBC    HTN: continue on core, hydralazine    OSA: CPAP qhs    Hx CVA: continue on statin, eliquis    CKDIV: Cr is trending down today. Avoid nephrotoxic meds   Hx lower GI bleed: H&H are WNL   Obesity: BMI 34.6. Complicates overall care & prognosis    DVT prophylaxis: eliquis  Code Status: full  Family Communication:  Disposition Plan: likely d/c back home  Level of care: Progressive  Status is: Inpatient Remains inpatient appropriate because: severity of illness    Consultants:  Cardio   Procedures:   Antimicrobials:   Subjective: Pt c/o malaise  Objective: Vitals:   10/17/21 0444 10/17/21 0601 10/17/21 0812 10/17/21 1157  BP: (!) 107/92  (!) 109/91 102/84  Pulse: (!) 47  88 (!) 49  Resp: 18  18 18   Temp: 98.3 F (36.8 C)  98.1 F (36.7 C) 98.2 F (36.8 C)  TempSrc:   Oral Oral  SpO2: 93%  94% 93%  Weight:  112.6 kg    Height:        Intake/Output Summary (Last 24 hours) at 10/17/2021 1416 Last data filed at 10/17/2021 0900 Gross per 24 hour  Intake 1276 ml  Output 1885 ml  Net -609 ml   Filed Weights   10/14/21  0300 10/16/21 0406 10/17/21 0601  Weight: 113.3 kg 113.8 kg 112.6 kg    Examination:  General exam: Appears calm & comfortable  Respiratory system: diminished breath sounds b/l Cardiovascular system: irregularly irregular. No rubs or clicks  Gastrointestinal system: Abd is soft, obese, NT & normal bowel sounds Central nervous system: Alert and oriented. Moves all extremities  Psychiatry: judgement and insight appears normal. Flat mood and affect    Data Reviewed: I have personally reviewed following labs and imaging studies  CBC: Recent Labs  Lab 10/13/21 1550 10/14/21 0508 10/16/21 0443 10/17/21 0522  WBC 8.4 10.3 8.6 7.7  NEUTROABS 5.5  --   --   --   HGB 13.8 13.0 12.7* 12.6*  HCT 44.4 41.5 40.8 39.5  MCV 80.1 79.5* 80.0 79.0*  PLT 271 217 235 427   Basic Metabolic Panel: Recent Labs  Lab 10/13/21 1550 10/14/21 0508 10/15/21 0616 10/16/21 0443 10/17/21 0522  NA 133* 134* 136 135 137  K 3.7 3.5 3.6 3.7 3.5  CL 102 102 102 102 101  CO2 20* 21* 24 23 26   GLUCOSE 159* 143* 126* 142* 113*  BUN 72* 66* 61* 60* 56*  CREATININE 3.62* 3.44* 3.39* 3.54* 3.29*  CALCIUM 8.7* 8.1* 8.0* 7.9* 8.4*   GFR: Estimated Creatinine Clearance: 31.6 mL/min (A) (by C-G formula based  on SCr of 3.29 mg/dL (H)). Liver Function Tests: Recent Labs  Lab 10/13/21 1550  AST 20  ALT 14  ALKPHOS 86  BILITOT 3.8*  PROT 6.7  ALBUMIN 3.5   No results for input(s): "LIPASE", "AMYLASE" in the last 168 hours. No results for input(s): "AMMONIA" in the last 168 hours. Coagulation Profile: No results for input(s): "INR", "PROTIME" in the last 168 hours. Cardiac Enzymes: No results for input(s): "CKTOTAL", "CKMB", "CKMBINDEX", "TROPONINI" in the last 168 hours. BNP (last 3 results) No results for input(s): "PROBNP" in the last 8760 hours. HbA1C: No results for input(s): "HGBA1C" in the last 72 hours. CBG: No results for input(s): "GLUCAP" in the last 168 hours. Lipid Profile: No  results for input(s): "CHOL", "HDL", "LDLCALC", "TRIG", "CHOLHDL", "LDLDIRECT" in the last 72 hours. Thyroid Function Tests: No results for input(s): "TSH", "T4TOTAL", "FREET4", "T3FREE", "THYROIDAB" in the last 72 hours.  Anemia Panel: No results for input(s): "VITAMINB12", "FOLATE", "FERRITIN", "TIBC", "IRON", "RETICCTPCT" in the last 72 hours. Sepsis Labs: No results for input(s): "PROCALCITON", "LATICACIDVEN" in the last 168 hours.  Recent Results (from the past 240 hour(s))  Resp Panel by RT-PCR (Flu A&B, Covid) Anterior Nasal Swab     Status: None   Collection Time: 10/13/21  3:50 PM   Specimen: Anterior Nasal Swab  Result Value Ref Range Status   SARS Coronavirus 2 by RT PCR NEGATIVE NEGATIVE Final    Comment: (NOTE) SARS-CoV-2 target nucleic acids are NOT DETECTED.  The SARS-CoV-2 RNA is generally detectable in upper respiratory specimens during the acute phase of infection. The lowest concentration of SARS-CoV-2 viral copies this assay can detect is 138 copies/mL. A negative result does not preclude SARS-Cov-2 infection and should not be used as the sole basis for treatment or other patient management decisions. A negative result may occur with  improper specimen collection/handling, submission of specimen other than nasopharyngeal swab, presence of viral mutation(s) within the areas targeted by this assay, and inadequate number of viral copies(<138 copies/mL). A negative result must be combined with clinical observations, patient history, and epidemiological information. The expected result is Negative.  Fact Sheet for Patients:  EntrepreneurPulse.com.au  Fact Sheet for Healthcare Providers:  IncredibleEmployment.be  This test is no t yet approved or cleared by the [place] FDA and  has been authorized for detection and/or diagnosis of SARS-CoV-2 by FDA under an Emergency Use Authorization (EUA). This EUA will remain  in effect  (meaning this test can be used) for the duration of the COVID-19 declaration under Section 564(b)(1) of the Act, 21 U.S.C.section 360bbb-3(b)(1), unless the authorization is terminated  or revoked sooner.       Influenza A by PCR NEGATIVE NEGATIVE Final   Influenza B by PCR NEGATIVE NEGATIVE Final    Comment: (NOTE) The Xpert Xpress SARS-CoV-2/FLU/RSV plus assay is intended as an aid in the diagnosis of influenza from Nasopharyngeal swab specimens and should not be used as a sole basis for treatment. Nasal washings and aspirates are unacceptable for Xpert Xpress SARS-CoV-2/FLU/RSV testing.  Fact Sheet for Patients: EntrepreneurPulse.com.au  Fact Sheet for Healthcare Providers: IncredibleEmployment.be  This test is not yet approved or cleared by the [place] FDA and has been authorized for detection and/or diagnosis of SARS-CoV-2 by FDA under an Emergency Use Authorization (EUA). This EUA will remain in effect (meaning this test can be used) for the duration of the COVID-19 declaration under Section 564(b)(1) of the Act, 21 U.S.C. section 360bbb-3(b)(1), unless the authorization is terminated or  revoked.  Performed at [facility], [address] [place], [place] [address]          Radiology Studies: No results found.      Scheduled Meds:  allopurinol  50 mg Oral Daily   apixaban  5 mg Oral BID   atorvastatin  80 mg Oral Daily   carvedilol  6.25 mg Oral BID WC   dapagliflozin propanediol  10 mg Oral QAC breakfast   furosemide  80 mg Intravenous [id number]   hydrALAZINE  25 mg Oral TID   isosorbide dinitrate  5 mg Oral TID   levothyroxine  100 mcg Oral QAC breakfast   potassium chloride  40 mEq Oral Daily   Continuous Infusions:   LOS: 4 days    Time spent: 25 mins     Wyvonnia Dusky, MD [facility] Pager [phone]  If 7PM-7AM, please contact night-coverage [email or web address] 10/17/2021,  2:16 PM

## 2021-10-18 DIAGNOSIS — I5023 Acute on chronic systolic (congestive) heart failure: Secondary | ICD-10-CM | POA: Diagnosis not present

## 2021-10-18 DIAGNOSIS — I4891 Unspecified atrial fibrillation: Secondary | ICD-10-CM | POA: Diagnosis not present

## 2021-10-18 DIAGNOSIS — N184 Chronic kidney disease, stage 4 (severe): Secondary | ICD-10-CM | POA: Diagnosis not present

## 2021-10-18 LAB — CBC
HCT: 40.3 % (ref 39.0–52.0)
Hemoglobin: 12.5 g/dL — ABNORMAL LOW (ref 13.0–17.0)
MCH: 24.8 pg — ABNORMAL LOW (ref 26.0–34.0)
MCHC: 31 g/dL (ref 30.0–36.0)
MCV: 80 fL (ref 80.0–100.0)
Platelets: 232 10*3/uL (ref 150–400)
RBC: 5.04 MIL/uL (ref 4.22–5.81)
RDW: 19.4 % — ABNORMAL HIGH (ref 11.5–15.5)
WBC: 7.4 10*3/uL (ref 4.0–10.5)
nRBC: 0 % (ref 0.0–0.2)

## 2021-10-18 LAB — BASIC METABOLIC PANEL
Anion gap: 9 (ref 5–15)
BUN: 54 mg/dL — ABNORMAL HIGH (ref 6–20)
CO2: 26 mmol/L (ref 22–32)
Calcium: 8.5 mg/dL — ABNORMAL LOW (ref 8.9–10.3)
Chloride: 102 mmol/L (ref 98–111)
Creatinine, Ser: 3.14 mg/dL — ABNORMAL HIGH (ref 0.61–1.24)
GFR, Estimated: 22 mL/min — ABNORMAL LOW (ref >60)
Glucose, Bld: 122 mg/dL — ABNORMAL HIGH (ref 70–99)
Potassium: 3.9 mmol/L (ref 3.5–5.1)
Sodium: 137 mmol/L (ref 135–145)

## 2021-10-18 MED ORDER — LABETALOL HCL 5 MG/ML IV SOLN
20.0000 mg | INTRAVENOUS | Status: DC | PRN
  Administered 2021-10-18: 20 mg via INTRAVENOUS
  Filled 2021-10-18: qty 4

## 2021-10-18 MED ORDER — METOLAZONE 5 MG PO TABS
5.0000 mg | ORAL_TABLET | Freq: Once | ORAL | Status: AC
  Administered 2021-10-18: 5 mg via ORAL
  Filled 2021-10-18: qty 1

## 2021-10-18 NOTE — Progress Notes (Signed)
Progress Note  Patient Name: Hector [name] Date of Encounter: 10/18/2021  Primary Cardiologist: Rockey Situ  Subjective   No chest pain. Dyspnea improving, not back to baseline. Lower extremity swelling and abdominal distension improving. Renal function improving. Documented UOP 1.2 L for the past 24 hours, net - 5.6 L for the admission. Weight 112.6 to 111.6 kg over the past 24 hours.   Inpatient Medications    Scheduled Meds:  allopurinol  50 mg Oral Daily   apixaban  5 mg Oral BID   atorvastatin  80 mg Oral Daily   carvedilol  6.25 mg Oral BID WC   dapagliflozin propanediol  10 mg Oral QAC breakfast   furosemide  80 mg Intravenous [id number]   hydrALAZINE  25 mg Oral TID   isosorbide dinitrate  5 mg Oral TID   levothyroxine  100 mcg Oral QAC breakfast   potassium chloride  40 mEq Oral Daily   Continuous Infusions:  PRN Meds: acetaminophen **OR** acetaminophen, labetalol, ondansetron **OR** ondansetron (ZOFRAN) IV, oxyCODONE-acetaminophen, traZODone   Vital Signs    Vitals:   10/17/21 2330 10/18/21 0427 10/18/21 0435 10/18/21 0814  BP: 104/79 (!) 159/137  109/88  Pulse: 92 83  96  Resp: 18 18  19   Temp: 98.5 F (36.9 C) 98.4 F (36.9 C)  98.3 F (36.8 C)  TempSrc:      SpO2: 94% 95%  98%  Weight:   111.6 kg   Height:        Intake/Output Summary (Last 24 hours) at 10/18/2021 1033 Last data filed at 10/18/2021 1014 Gross per 24 hour  Intake 1680 ml  Output 3400 ml  Net -1720 ml   Filed Weights   10/16/21 0406 10/17/21 0601 10/18/21 0435  Weight: 113.8 kg 112.6 kg 111.6 kg    Telemetry    Afib 90s to low 100s bpm - Personally Reviewed  ECG    No new tracings - Personally Reviewed  Physical Exam   GEN: No acute distress.   Neck: JVD to jaw. Cardiac: IRIR, no murmurs, rubs, or gallops.  Respiratory: Clear to auscultation bilaterally.  GI: Soft, nontender, non-distended.   MS: No edema; No deformity. Neuro:  Alert and oriented x 3; Nonfocal.   Psych: Normal affect.  Labs    Chemistry Recent Labs  Lab 10/13/21 1550 10/14/21 0508 10/16/21 0443 10/17/21 0522 10/18/21 0409  NA 133*   < > 135 137 137  K 3.7   < > 3.7 3.5 3.9  CL 102   < > 102 101 102  CO2 20*   < > 23 26 26   GLUCOSE 159*   < > 142* 113* 122*  BUN 72*   < > 60* 56* 54*  CREATININE 3.62*   < > 3.54* 3.29* 3.14*  CALCIUM 8.7*   < > 7.9* 8.4* 8.5*  PROT 6.7  --   --   --   --   ALBUMIN 3.5  --   --   --   --   AST 20  --   --   --   --   ALT 14  --   --   --   --   ALKPHOS 86  --   --   --   --   BILITOT 3.8*  --   --   --   --   GFRNONAA 19*   < > 19* 21* 22*  ANIONGAP 11   < > 10 10 9    < > =  values in this interval not displayed.     Hematology Recent Labs  Lab 10/16/21 0443 10/17/21 0522 10/18/21 0409  WBC 8.6 7.7 7.4  RBC 5.10 5.00 5.04  HGB 12.7* 12.6* 12.5*  HCT 40.8 39.5 40.3  MCV 80.0 79.0* 80.0  MCH 24.9* 25.2* 24.8*  MCHC 31.1 31.9 31.0  RDW 19.2* 19.5* 19.4*  PLT 235 236 232    Cardiac EnzymesNo results for input(s): "TROPONINI" in the last 168 hours. No results for input(s): "TROPIPOC" in the last 168 hours.   BNP Recent Labs  Lab 10/13/21 1550  BNP 2,587.7*     DDimer No results for input(s): "DDIMER" in the last 168 hours.   Radiology    No results found.  Cardiac Studies   2D echo 09/29/2021:  1. Left ventricular ejection fraction, by estimation, is <20%. The left  ventricle has severely decreased function. The left ventricle has no  regional wall motion abnormalities. The left ventricular internal cavity  size was moderately dilated. Left  ventricular diastolic parameters are indeterminate.   2. Right ventricular systolic function is mildly reduced. The right  ventricular size is normal. Tricuspid regurgitation signal is inadequate  for assessing PA pressure.   3. Left atrial size was moderately dilated.   4. Right atrial size was mildly dilated.   5. The mitral valve is normal in structure. Moderate  mitral valve  regurgitation. No evidence of mitral stenosis.   6. The aortic valve is normal in structure. Aortic valve regurgitation is  not visualized. Aortic valve sclerosis/calcification is present, without  any evidence of aortic stenosis.   7. The inferior vena cava is dilated in size with <50% respiratory  variability, suggesting right atrial pressure of 15 mmHg.   Patient Profile     56 y.o. male with history of chronic combined systolic/diastolic CHF, NICM presumed to be secondary to ETOH, persistent atrial flutter/fib, HTN, HL, DMII, OSA on CPAP, CKD stage IV, hypothyroidism, medication noncompliance, and GIB on Eliquis, who was admitted August 21 with progressive heart failure and atrial fib/flutter with RVR.  Assessment & Plan    1. Acute on chronic combined systolic and diastolic CHF secondary to NICM: -Recently admitted earlier this month for the same -Volume status improving -Exacerbation likely in the setting of missing medications -Continue IV Lasix 80 mg q 8 hours with close monitoring of renal function -Coreg, Farxiga, Imdur/hydralazine -He has previously been evaluated by EP and felt to not be a candidate for ICD given medication nonadherence, he would also not be a candidate for advanced heart failure modalities without documented medication compliance -Daily weights -Strict I/O  2. Persistent Afib/flutter: -Ventricular rates reasonably controlled -Difficult to pursue TEE guided DCCV at this time given prior medication nonadherence in the outpatient setting -For now, ventricular rates are reasonably controlled on carvedilol -Not a candidate for amiodarone given abnormal thyroid function -Not a candidate for ablation per EP given medication nonadherence -CHADS2VASc at least 3 (CHF, HTN, DM) -Eliquis 5 mg bid (does not meet reduced dosing criteria)  3. Acute on CKD stage IV: -Improving with diuresis -Monitor  4. Sleep apnea: -Compliance with CPAP is encouraged    5. Medication nonadherence: -This seems to be his biggest driving force in hospital readmissions and overall symptomology -Compliance is encouraged    For questions or updates, please contact [place] Please consult [email or web address] for contact info under Cardiology/STEMI.    Signed, Christell Faith, PA-C [place] Pager: [phone] 10/18/2021, 10:33 AM

## 2021-10-18 NOTE — Progress Notes (Signed)
PROGRESS NOTE    Hector [name]  KGY:[id number] DOB: [birth year] DOA: 10/13/2021 PCP: [name], MD    Assessment & Plan:   Principal Problem:   Acute on chronic systolic (congestive) heart failure (HCC) Active Problems:   Nonischemic cardiomyopathy: EF 20-25%   Atrial fibrillation with rapid ventricular response (HCC)   Hypothyroidism   Essential hypertension   Obstructive sleep apnea   CKD (chronic kidney disease) stage 4, GFR 15-29 ml/min (HCC)   Stroke (HCC)   Alcohol abuse   Obesity (BMI 30-39.9)   Dyslipidemia  Assessment and Plan: Acute on chronic combined CHF: continue on IV lasix. Neg approx 2.3L today so far. Continue on coreg, imdur, hydralazine & farxiga.   Likely PAF: continue on coreg, eliquis    Abdominal pain: CT shows likely proctitis & cholelithiasis. Denies abd pain today so far. No fevers, normal WBC    HTN: continue on hydralazine, imdur & coreg    OSA: CPAP qhs    Hx CVA: continue on eliquis, statin    CKDIV: Cr is trending down daily. Avoid nephrotoxic meds    Hx lower GI bleed: H&H are WNL   Obesity: BMI 34.3. Complicates overall care & prognosis   DVT prophylaxis: eliquis  Code Status: full  Family Communication:  Disposition Plan: likely d/c back home  Level of care: Progressive  Status is: Inpatient Remains inpatient appropriate because: severity of illness, still requiring IV lasix     Consultants:  Cardio   Procedures:   Antimicrobials:   Subjective: Pt c/o fatigue   Objective: Vitals:   10/18/21 0427 10/18/21 0435 10/18/21 0814 10/18/21 1151  BP: (!) 159/137  109/88 104/69  Pulse: 83  96 87  Resp: 18  19 19   Temp: 98.4 F (36.9 C)  98.3 F (36.8 C) 98.1 F (36.7 C)  TempSrc:    Oral  SpO2: 95%  98% 98%  Weight:  111.6 kg    Height:        Intake/Output Summary (Last 24 hours) at 10/18/2021 1455 Last data filed at 10/18/2021 1444 Gross per 24 hour  Intake 1920 ml  Output 4275 ml  Net -2355  ml   Filed Weights   10/16/21 0406 10/17/21 0601 10/18/21 0435  Weight: 113.8 kg 112.6 kg 111.6 kg    Examination:  General exam: Appears comfortable  Respiratory system: decreased breath sounds b/l Cardiovascular system: irregularly irregular. No rubs or gallops  Gastrointestinal system: Abd is soft, NT, obese & normal bowel sounds  Central nervous system: Alert and oriented. Moves all extremities  Psychiatry: judgement and insight appears normal. Flat mood and affect    Data Reviewed: I have personally reviewed following labs and imaging studies  CBC: Recent Labs  Lab 10/13/21 1550 10/14/21 0508 10/16/21 0443 10/17/21 0522 10/18/21 0409  WBC 8.4 10.3 8.6 7.7 7.4  NEUTROABS 5.5  --   --   --   --   HGB 13.8 13.0 12.7* 12.6* 12.5*  HCT 44.4 41.5 40.8 39.5 40.3  MCV 80.1 79.5* 80.0 79.0* 80.0  PLT 271 217 235 236 026   Basic Metabolic Panel: Recent Labs  Lab 10/14/21 0508 10/15/21 0616 10/16/21 0443 10/17/21 0522 10/18/21 0409  NA 134* 136 135 137 137  K 3.5 3.6 3.7 3.5 3.9  CL 102 102 102 101 102  CO2 21* 24 23 26 26   GLUCOSE 143* 126* 142* 113* 122*  BUN 66* 61* 60* 56* 54*  CREATININE 3.44* 3.39* 3.54* 3.29* 3.14*  CALCIUM  8.1* 8.0* 7.9* 8.4* 8.5*   GFR: Estimated Creatinine Clearance: 33 mL/min (A) (by C-G formula based on SCr of 3.14 mg/dL (H)). Liver Function Tests: Recent Labs  Lab 10/13/21 1550  AST 20  ALT 14  ALKPHOS 86  BILITOT 3.8*  PROT 6.7  ALBUMIN 3.5   No results for input(s): "LIPASE", "AMYLASE" in the last 168 hours. No results for input(s): "AMMONIA" in the last 168 hours. Coagulation Profile: No results for input(s): "INR", "PROTIME" in the last 168 hours. Cardiac Enzymes: No results for input(s): "CKTOTAL", "CKMB", "CKMBINDEX", "TROPONINI" in the last 168 hours. BNP (last 3 results) No results for input(s): "PROBNP" in the last 8760 hours. HbA1C: No results for input(s): "HGBA1C" in the last 72 hours. CBG: No results for  input(s): "GLUCAP" in the last 168 hours. Lipid Profile: No results for input(s): "CHOL", "HDL", "LDLCALC", "TRIG", "CHOLHDL", "LDLDIRECT" in the last 72 hours. Thyroid Function Tests: No results for input(s): "TSH", "T4TOTAL", "FREET4", "T3FREE", "THYROIDAB" in the last 72 hours.  Anemia Panel: No results for input(s): "VITAMINB12", "FOLATE", "FERRITIN", "TIBC", "IRON", "RETICCTPCT" in the last 72 hours. Sepsis Labs: No results for input(s): "PROCALCITON", "LATICACIDVEN" in the last 168 hours.  Recent Results (from the past 240 hour(s))  Resp Panel by RT-PCR (Flu A&B, Covid) Anterior Nasal Swab     Status: None   Collection Time: 10/13/21  3:50 PM   Specimen: Anterior Nasal Swab  Result Value Ref Range Status   SARS Coronavirus 2 by RT PCR NEGATIVE NEGATIVE Final    Comment: (NOTE) SARS-CoV-2 target nucleic acids are NOT DETECTED.  The SARS-CoV-2 RNA is generally detectable in upper respiratory specimens during the acute phase of infection. The lowest concentration of SARS-CoV-2 viral copies this assay can detect is 138 copies/mL. A negative result does not preclude SARS-Cov-2 infection and should not be used as the sole basis for treatment or other patient management decisions. A negative result may occur with  improper specimen collection/handling, submission of specimen other than nasopharyngeal swab, presence of viral mutation(s) within the areas targeted by this assay, and inadequate number of viral copies(<138 copies/mL). A negative result must be combined with clinical observations, patient history, and epidemiological information. The expected result is Negative.  Fact Sheet for Patients:  EntrepreneurPulse.com.au  Fact Sheet for Healthcare Providers:  IncredibleEmployment.be  This test is no t yet approved or cleared by the [place] FDA and  has been authorized for detection and/or diagnosis of SARS-CoV-2 by FDA under an  Emergency Use Authorization (EUA). This EUA will remain  in effect (meaning this test can be used) for the duration of the COVID-19 declaration under Section 564(b)(1) of the Act, 21 U.S.C.section 360bbb-3(b)(1), unless the authorization is terminated  or revoked sooner.       Influenza A by PCR NEGATIVE NEGATIVE Final   Influenza B by PCR NEGATIVE NEGATIVE Final    Comment: (NOTE) The Xpert Xpress SARS-CoV-2/FLU/RSV plus assay is intended as an aid in the diagnosis of influenza from Nasopharyngeal swab specimens and should not be used as a sole basis for treatment. Nasal washings and aspirates are unacceptable for Xpert Xpress SARS-CoV-2/FLU/RSV testing.  Fact Sheet for Patients: EntrepreneurPulse.com.au  Fact Sheet for Healthcare Providers: IncredibleEmployment.be  This test is not yet approved or cleared by the [place] FDA and has been authorized for detection and/or diagnosis of SARS-CoV-2 by FDA under an Emergency Use Authorization (EUA). This EUA will remain in effect (meaning this test can be used) for the duration of the  COVID-19 declaration under Section 564(b)(1) of the Act, 21 U.S.C. section 360bbb-3(b)(1), unless the authorization is terminated or revoked.  Performed at [facility], [address] [place], [place] [address]          Radiology Studies: No results found.      Scheduled Meds:  allopurinol  50 mg Oral Daily   apixaban  5 mg Oral BID   atorvastatin  80 mg Oral Daily   carvedilol  6.25 mg Oral BID WC   dapagliflozin propanediol  10 mg Oral QAC breakfast   furosemide  80 mg Intravenous [id number]   hydrALAZINE  25 mg Oral TID   isosorbide dinitrate  5 mg Oral TID   levothyroxine  100 mcg Oral QAC breakfast   potassium chloride  40 mEq Oral Daily   Continuous Infusions:   LOS: 5 days    Time spent: 25 mins     Wyvonnia Dusky, MD [facility] Pager [phone]  [phone]  If 7PM-7AM, please contact night-coverage [email or web address] 10/18/2021, 2:55 PM

## 2021-10-19 DIAGNOSIS — N184 Chronic kidney disease, stage 4 (severe): Secondary | ICD-10-CM | POA: Diagnosis not present

## 2021-10-19 DIAGNOSIS — I4891 Unspecified atrial fibrillation: Secondary | ICD-10-CM | POA: Diagnosis not present

## 2021-10-19 DIAGNOSIS — I5023 Acute on chronic systolic (congestive) heart failure: Secondary | ICD-10-CM | POA: Diagnosis not present

## 2021-10-19 LAB — CBC
HCT: 42.8 % (ref 39.0–52.0)
Hemoglobin: 13.3 g/dL (ref 13.0–17.0)
MCH: 24.6 pg — ABNORMAL LOW (ref 26.0–34.0)
MCHC: 31.1 g/dL (ref 30.0–36.0)
MCV: 79.1 fL — ABNORMAL LOW (ref 80.0–100.0)
Platelets: 257 10*3/uL (ref 150–400)
RBC: 5.41 MIL/uL (ref 4.22–5.81)
RDW: 19.1 % — ABNORMAL HIGH (ref 11.5–15.5)
WBC: 6.9 10*3/uL (ref 4.0–10.5)
nRBC: 0 % (ref 0.0–0.2)

## 2021-10-19 LAB — BASIC METABOLIC PANEL
Anion gap: 9 (ref 5–15)
BUN: 55 mg/dL — ABNORMAL HIGH (ref 6–20)
CO2: 29 mmol/L (ref 22–32)
Calcium: 8.9 mg/dL (ref 8.9–10.3)
Chloride: 99 mmol/L (ref 98–111)
Creatinine, Ser: 3.23 mg/dL — ABNORMAL HIGH (ref 0.61–1.24)
GFR, Estimated: 21 mL/min — ABNORMAL LOW (ref >60)
Glucose, Bld: 125 mg/dL — ABNORMAL HIGH (ref 70–99)
Potassium: 3.9 mmol/L (ref 3.5–5.1)
Sodium: 137 mmol/L (ref 135–145)

## 2021-10-19 MED ORDER — METOLAZONE 5 MG PO TABS
5.0000 mg | ORAL_TABLET | Freq: Once | ORAL | Status: AC
  Administered 2021-10-19: 5 mg via ORAL
  Filled 2021-10-19: qty 1

## 2021-10-19 MED ORDER — TORSEMIDE 20 MG PO TABS
40.0000 mg | ORAL_TABLET | Freq: Two times a day (BID) | ORAL | Status: DC
  Administered 2021-10-20: 40 mg via ORAL
  Filled 2021-10-19: qty 2

## 2021-10-19 NOTE — Progress Notes (Signed)
PROGRESS NOTE    Hector [name]  EZM:[id number] DOB: 16-Jul-1965 DOA: 10/13/2021 PCP: [name], MD    Assessment & Plan:   Principal Problem:   Acute on chronic systolic (congestive) heart failure (HCC) Active Problems:   Nonischemic cardiomyopathy: EF 20-25%   Atrial fibrillation with rapid ventricular response (HCC)   Hypothyroidism   Essential hypertension   Obstructive sleep apnea   CKD (chronic kidney disease) stage 4, GFR 15-29 ml/min (HCC)   Stroke (HCC)   Alcohol abuse   Obesity (BMI 30-39.9)   Dyslipidemia  Assessment and Plan: Acute on chronic combined CHF: improving. Continue on IV lasix. Monitor I/Os. Neg approx 3.1L so far today. Continue on imdur, hydralazine, coreg & farxiga  Likely PAF: continue on eliquis, coreg    Abdominal pain: CT shows likely proctitis & cholelithiasis. Denies abd pain today so far. No fevers, normal WBC    HTN: continue on coreg, hydralazine, imdur    OSA: CPAP qhs    Hx CVA: continue on statin, eliquis   CKDIV: Cr is labile. Avoid nephrotoxic meds   Hx lower GI bleed: H&H are WNL   Obesity: BMI 34.3. Complicates overall care & prognosis   DVT prophylaxis: eliquis  Code Status: full  Family Communication:  Disposition Plan: likely d/c back home  Level of care: Progressive  Status is: Inpatient Remains inpatient appropriate because: severity of illness, still requiring IV lasix     Consultants:  Cardio   Procedures:   Antimicrobials:   Subjective: Pt c/o malaise  Objective: Vitals:   10/19/21 0020 10/19/21 0405 10/19/21 0752 10/19/21 1139  BP: 91/76 95/65 114/83 102/70  Pulse: 82 92 88 77  Resp: 18 16 18 19   Temp: 97.6 F (36.4 C) 97.8 F (36.6 C) 98.3 F (36.8 C) 98.3 F (36.8 C)  TempSrc: Oral Oral Oral Oral  SpO2: 96% 96% 96% 98%  Weight:      Height:        Intake/Output Summary (Last 24 hours) at 10/19/2021 1340 Last data filed at 10/19/2021 1245 Gross per 24 hour  Intake 1820  ml  Output 4925 ml  Net -3105 ml   Filed Weights   10/16/21 0406 10/17/21 0601 10/18/21 0435  Weight: 113.8 kg 112.6 kg 111.6 kg    Examination:  General exam: Appears calm & comfortable  Respiratory system: diminished breath sounds b/l  Cardiovascular system: irregular irregular. No rubs or clicks  Gastrointestinal system: Abd is soft, NT, obese & hypoactive bowel sounds  Central nervous system: Alert and oriented. Moves all extremities  Psychiatry: judgement and insight appears normal. Flat mood and affect     Data Reviewed: I have personally reviewed following labs and imaging studies  CBC: Recent Labs  Lab 10/13/21 1550 10/14/21 0508 10/16/21 0443 10/17/21 0522 10/18/21 0409 10/19/21 0434  WBC 8.4 10.3 8.6 7.7 7.4 6.9  NEUTROABS 5.5  --   --   --   --   --   HGB 13.8 13.0 12.7* 12.6* 12.5* 13.3  HCT 44.4 41.5 40.8 39.5 40.3 42.8  MCV 80.1 79.5* 80.0 79.0* 80.0 79.1*  PLT 271 217 235 236 232 503   Basic Metabolic Panel: Recent Labs  Lab 10/15/21 0616 10/16/21 0443 10/17/21 0522 10/18/21 0409 10/19/21 0434  NA 136 135 137 137 137  K 3.6 3.7 3.5 3.9 3.9  CL 102 102 101 102 99  CO2 24 23 26 26 29   GLUCOSE 126* 142* 113* 122* 125*  BUN 61* 60* 56*  54* 55*  CREATININE 3.39* 3.54* 3.29* 3.14* 3.23*  CALCIUM 8.0* 7.9* 8.4* 8.5* 8.9   GFR: Estimated Creatinine Clearance: 32 mL/min (A) (by C-G formula based on SCr of 3.23 mg/dL (H)). Liver Function Tests: Recent Labs  Lab 10/13/21 1550  AST 20  ALT 14  ALKPHOS 86  BILITOT 3.8*  PROT 6.7  ALBUMIN 3.5   No results for input(s): "LIPASE", "AMYLASE" in the last 168 hours. No results for input(s): "AMMONIA" in the last 168 hours. Coagulation Profile: No results for input(s): "INR", "PROTIME" in the last 168 hours. Cardiac Enzymes: No results for input(s): "CKTOTAL", "CKMB", "CKMBINDEX", "TROPONINI" in the last 168 hours. BNP (last 3 results) No results for input(s): "PROBNP" in the last 8760  hours. HbA1C: No results for input(s): "HGBA1C" in the last 72 hours. CBG: No results for input(s): "GLUCAP" in the last 168 hours. Lipid Profile: No results for input(s): "CHOL", "HDL", "LDLCALC", "TRIG", "CHOLHDL", "LDLDIRECT" in the last 72 hours. Thyroid Function Tests: No results for input(s): "TSH", "T4TOTAL", "FREET4", "T3FREE", "THYROIDAB" in the last 72 hours.  Anemia Panel: No results for input(s): "VITAMINB12", "FOLATE", "FERRITIN", "TIBC", "IRON", "RETICCTPCT" in the last 72 hours. Sepsis Labs: No results for input(s): "PROCALCITON", "LATICACIDVEN" in the last 168 hours.  Recent Results (from the past 240 hour(s))  Resp Panel by RT-PCR (Flu A&B, Covid) Anterior Nasal Swab     Status: None   Collection Time: 10/13/21  3:50 PM   Specimen: Anterior Nasal Swab  Result Value Ref Range Status   SARS Coronavirus 2 by RT PCR NEGATIVE NEGATIVE Final    Comment: (NOTE) SARS-CoV-2 target nucleic acids are NOT DETECTED.  The SARS-CoV-2 RNA is generally detectable in upper respiratory specimens during the acute phase of infection. The lowest concentration of SARS-CoV-2 viral copies this assay can detect is 138 copies/mL. A negative result does not preclude SARS-Cov-2 infection and should not be used as the sole basis for treatment or other patient management decisions. A negative result may occur with  improper specimen collection/handling, submission of specimen other than nasopharyngeal swab, presence of viral mutation(s) within the areas targeted by this assay, and inadequate number of viral copies(<138 copies/mL). A negative result must be combined with clinical observations, patient history, and epidemiological information. The expected result is Negative.  Fact Sheet for Patients:  EntrepreneurPulse.com.au  Fact Sheet for Healthcare Providers:  IncredibleEmployment.be  This test is no t yet approved or cleared by the [place] FDA  and  has been authorized for detection and/or diagnosis of SARS-CoV-2 by FDA under an Emergency Use Authorization (EUA). This EUA will remain  in effect (meaning this test can be used) for the duration of the COVID-19 declaration under Section 564(b)(1) of the Act, 21 U.S.C.section 360bbb-3(b)(1), unless the authorization is terminated  or revoked sooner.       Influenza A by PCR NEGATIVE NEGATIVE Final   Influenza B by PCR NEGATIVE NEGATIVE Final    Comment: (NOTE) The Xpert Xpress SARS-CoV-2/FLU/RSV plus assay is intended as an aid in the diagnosis of influenza from Nasopharyngeal swab specimens and should not be used as a sole basis for treatment. Nasal washings and aspirates are unacceptable for Xpert Xpress SARS-CoV-2/FLU/RSV testing.  Fact Sheet for Patients: EntrepreneurPulse.com.au  Fact Sheet for Healthcare Providers: IncredibleEmployment.be  This test is not yet approved or cleared by the [place] FDA and has been authorized for detection and/or diagnosis of SARS-CoV-2 by FDA under an Emergency Use Authorization (EUA). This EUA will remain in effect (  meaning this test can be used) for the duration of the COVID-19 declaration under Section 564(b)(1) of the Act, 21 U.S.C. section 360bbb-3(b)(1), unless the authorization is terminated or revoked.  Performed at [facility], [address], [place], [place] [address]          Radiology Studies: No results found.      Scheduled Meds:  allopurinol  50 mg Oral Daily   apixaban  5 mg Oral BID   atorvastatin  80 mg Oral Daily   carvedilol  6.25 mg Oral BID WC   dapagliflozin propanediol  10 mg Oral QAC breakfast   furosemide  80 mg Intravenous [id number]   hydrALAZINE  25 mg Oral TID   isosorbide dinitrate  5 mg Oral TID   levothyroxine  100 mcg Oral QAC breakfast   metolazone  5 mg Oral Once   potassium chloride  40 mEq Oral Daily   [START ON 10/20/2021]  torsemide  40 mg Oral BID   Continuous Infusions:   LOS: 6 days    Time spent: 25 mins     Wyvonnia Dusky, MD [facility] Pager [phone]  If 7PM-7AM, please contact night-coverage [email or web address] 10/19/2021, 1:40 PM

## 2021-10-19 NOTE — Progress Notes (Signed)
Rounding Note    Patient Name: Hector [name] Date of Encounter: 10/19/2021  [place] Cardiologist: Ida Rogue, MD   Subjective   Shortness of breath better, abdominal distention also improving.  Diuresing adequately, was given metolazone x1 yesterday.  Inpatient Medications    Scheduled Meds:  allopurinol  50 mg Oral Daily   apixaban  5 mg Oral BID   atorvastatin  80 mg Oral Daily   carvedilol  6.25 mg Oral BID WC   dapagliflozin propanediol  10 mg Oral QAC breakfast   furosemide  80 mg Intravenous [id number]   hydrALAZINE  25 mg Oral TID   isosorbide dinitrate  5 mg Oral TID   levothyroxine  100 mcg Oral QAC breakfast   metolazone  5 mg Oral Once   potassium chloride  40 mEq Oral Daily   [START ON 10/20/2021] torsemide  40 mg Oral BID   Continuous Infusions:  PRN Meds: acetaminophen **OR** acetaminophen, labetalol, ondansetron **OR** ondansetron (ZOFRAN) IV, oxyCODONE-acetaminophen, traZODone   Vital Signs    Vitals:   10/19/21 0020 10/19/21 0405 10/19/21 0752 10/19/21 1139  BP: 91/76 95/65 114/83 102/70  Pulse: 82 92 88 77  Resp: 18 16 18 19   Temp: 97.6 F (36.4 C) 97.8 F (36.6 C) 98.3 F (36.8 C) 98.3 F (36.8 C)  TempSrc: Oral Oral Oral Oral  SpO2: 96% 96% 96% 98%  Weight:      Height:        Intake/Output Summary (Last 24 hours) at 10/19/2021 1259 Last data filed at 10/19/2021 1245 Gross per 24 hour  Intake 1820 ml  Output 4925 ml  Net -3105 ml      10/18/2021    4:35 AM 10/17/2021    6:01 AM 10/16/2021    4:06 AM  Last 3 Weights  Weight (lbs) 246 lb 248 lb 3.8 oz 250 lb 12.8 oz  Weight (kg) 111.585 kg 112.6 kg 113.762 kg      Telemetry    Atrial fibrillation heart rate 88-95- Personally Reviewed  ECG     - Personally Reviewed  Physical Exam   GEN: No acute distress.   Neck: No JVD Cardiac: Irregular irregular Respiratory: Clear breath sounds, no wheezing GI: Soft, nontender, +distended  MS: no edema; No  deformity. Neuro:  Nonfocal  Psych: Normal affect   Labs    High Sensitivity Troponin:   Recent Labs  Lab 10/13/21 1550 10/13/21 1849  TROPONINIHS 109* 95*     Chemistry Recent Labs  Lab 10/13/21 1550 10/14/21 0508 10/17/21 0522 10/18/21 0409 10/19/21 0434  NA 133*   < > 137 137 137  K 3.7   < > 3.5 3.9 3.9  CL 102   < > 101 102 99  CO2 20*   < > 26 26 29   GLUCOSE 159*   < > 113* 122* 125*  BUN 72*   < > 56* 54* 55*  CREATININE 3.62*   < > 3.29* 3.14* 3.23*  CALCIUM 8.7*   < > 8.4* 8.5* 8.9  PROT 6.7  --   --   --   --   ALBUMIN 3.5  --   --   --   --   AST 20  --   --   --   --   ALT 14  --   --   --   --   ALKPHOS 86  --   --   --   --   BILITOT 3.8*  --   --   --   --  GFRNONAA 19*   < > 21* 22* 21*  ANIONGAP 11   < > 10 9 9    < > = values in this interval not displayed.    Lipids No results for input(s): "CHOL", "TRIG", "HDL", "LABVLDL", "LDLCALC", "CHOLHDL" in the last 168 hours.  Hematology Recent Labs  Lab 10/17/21 0522 10/18/21 0409 10/19/21 0434  WBC 7.7 7.4 6.9  RBC 5.00 5.04 5.41  HGB 12.6* 12.5* 13.3  HCT 39.5 40.3 42.8  MCV 79.0* 80.0 79.1*  MCH 25.2* 24.8* 24.6*  MCHC 31.9 31.0 31.1  RDW 19.5* 19.4* 19.1*  PLT 236 232 257   Thyroid  Recent Labs  Lab 10/14/21 0508  TSH 3.714    BNP Recent Labs  Lab 10/13/21 1550  BNP 2,587.7*    DDimer No results for input(s): "DDIMER" in the last 168 hours.   Radiology    No results found.  Cardiac Studies   TTE 09/29/2021 1. Left ventricular ejection fraction, by estimation, is <20%. The left  ventricle has severely decreased function. The left ventricle has no  regional wall motion abnormalities. The left ventricular internal cavity  size was moderately dilated. Left  ventricular diastolic parameters are indeterminate.   2. Right ventricular systolic function is mildly reduced. The right  ventricular size is normal. Tricuspid regurgitation signal is inadequate  for assessing PA  pressure.   3. Left atrial size was moderately dilated.   4. Right atrial size was mildly dilated.   5. The mitral valve is normal in structure. Moderate mitral valve  regurgitation. No evidence of mitral stenosis.   6. The aortic valve is normal in structure. Aortic valve regurgitation is  not visualized. Aortic valve sclerosis/calcification is present, without  any evidence of aortic stenosis.   7. The inferior vena cava is dilated in size with <50% respiratory  variability, suggesting right atrial pressure of 15 mmHg.   Patient Profile     56 y.o. male with history of nonischemic cardiomyopathy EF less than 20%, paroxysmal A-fib/flutter, hypertension, CKD 4 presenting with worsening shortness of breath, edema being seen for CHF exacerbation.  Assessment & Plan    NICM EF less than 20% -Improving with diuresing, net -2.8 L over the past 24 hours -Close to being euvolemic. -Continue IV Lasix 80 mg tid through today -Give another metolazone 5 mg x 1 today -Start torsemide 40 mg twice daily tomorrow. -Continue Coreg, hydralazine, isosorbide, Farxiga.  2.  Paroxysmal atrial fibrillation -Currently in A-fib, heart rate controlled -Continue Coreg, Eliquis   3.  CKD 4 -Creatinine stable -Monitor with diuresis   Total encounter time more than 50 minutes  Greater than 50% was spent in counseling and coordination of care with the patient        Signed, Kate Sable, MD  10/19/2021, 12:59 PM

## 2021-10-20 DIAGNOSIS — I4891 Unspecified atrial fibrillation: Secondary | ICD-10-CM | POA: Diagnosis not present

## 2021-10-20 DIAGNOSIS — E669 Obesity, unspecified: Secondary | ICD-10-CM

## 2021-10-20 DIAGNOSIS — I5023 Acute on chronic systolic (congestive) heart failure: Secondary | ICD-10-CM | POA: Diagnosis not present

## 2021-10-20 LAB — CBC
HCT: 46.1 % (ref 39.0–52.0)
Hemoglobin: 14.3 g/dL (ref 13.0–17.0)
MCH: 24.4 pg — ABNORMAL LOW (ref 26.0–34.0)
MCHC: 31 g/dL (ref 30.0–36.0)
MCV: 78.8 fL — ABNORMAL LOW (ref 80.0–100.0)
Platelets: 288 10*3/uL (ref 150–400)
RBC: 5.85 MIL/uL — ABNORMAL HIGH (ref 4.22–5.81)
RDW: 19.3 % — ABNORMAL HIGH (ref 11.5–15.5)
WBC: 7.8 10*3/uL (ref 4.0–10.5)
nRBC: 0 % (ref 0.0–0.2)

## 2021-10-20 MED ORDER — ISOSORBIDE DINITRATE 5 MG PO TABS: 5.0000 mg | ORAL_TABLET | Freq: Three times a day (TID) | ORAL | 0 refills | Status: DC

## 2021-10-20 MED ORDER — POTASSIUM CHLORIDE CRYS ER 20 MEQ PO TBCR: 20.0000 meq | EXTENDED_RELEASE_TABLET | Freq: Every day | ORAL | 0 refills | Status: DC

## 2021-10-20 MED ORDER — TORSEMIDE 40 MG PO TABS: 40.0000 mg | ORAL_TABLET | Freq: Two times a day (BID) | ORAL | 0 refills | Status: DC

## 2021-10-20 NOTE — Progress Notes (Signed)
Rounding Note    Patient Name: Hector [name] Date of Encounter: 10/20/2021  [place] Cardiologist: Ida Rogue, MD   Subjective   Patient seen on a.m. rounds.  Denies any chest pain.  States that shortness of breath has improved.  -1.8 L out in the last 24 hours  Inpatient Medications    Scheduled Meds:  allopurinol  50 mg Oral Daily   apixaban  5 mg Oral BID   atorvastatin  80 mg Oral Daily   carvedilol  6.25 mg Oral BID WC   dapagliflozin propanediol  10 mg Oral QAC breakfast   hydrALAZINE  25 mg Oral TID   isosorbide dinitrate  5 mg Oral TID   levothyroxine  100 mcg Oral QAC breakfast   potassium chloride  40 mEq Oral Daily   torsemide  40 mg Oral BID   Continuous Infusions:  PRN Meds: acetaminophen **OR** acetaminophen, labetalol, ondansetron **OR** ondansetron (ZOFRAN) IV, oxyCODONE-acetaminophen, traZODone   Vital Signs    Vitals:   10/20/21 0454 10/20/21 0500 10/20/21 0827 10/20/21 1210  BP: 91/72  101/66 105/82  Pulse: 75  (!) 51 (!) 58  Resp: 14  18 18   Temp: 98.1 F (36.7 C)  98.6 F (37 C) 98 F (36.7 C)  TempSrc: Oral     SpO2: 95%  97%   Weight:  108.5 kg    Height:        Intake/Output Summary (Last 24 hours) at 10/20/2021 1221 Last data filed at 10/20/2021 0705 Gross per 24 hour  Intake 1060 ml  Output 2875 ml  Net -1815 ml      10/20/2021    5:00 AM 10/18/2021    4:35 AM 10/17/2021    6:01 AM  Last 3 Weights  Weight (lbs) 239 lb 3.2 oz 246 lb 248 lb 3.8 oz  Weight (kg) 108.5 kg 111.585 kg 112.6 kg      Telemetry    Atrial fibrillation rate controlled with a Baron C's and rate 9200- Personally Reviewed  ECG    No new tracings- Personally Reviewed  Physical Exam   GEN: No acute distress.   Neck: No JVD Cardiac: Irregularly irregular, no murmurs, rubs, or gallops.  Respiratory: Clear to auscultation bilaterally.  Rations are unlabored on room air GI: Soft, nontender, non-distended  MS: No edema; No  deformity. Neuro:  Nonfocal  Psych: Normal affect   Labs    High Sensitivity Troponin:   Recent Labs  Lab 10/13/21 1550 10/13/21 1849  TROPONINIHS 109* 95*     Chemistry Recent Labs  Lab 10/13/21 1550 10/14/21 0508 10/17/21 0522 10/18/21 0409 10/19/21 0434  NA 133*   < > 137 137 137  K 3.7   < > 3.5 3.9 3.9  CL 102   < > 101 102 99  CO2 20*   < > 26 26 29   GLUCOSE 159*   < > 113* 122* 125*  BUN 72*   < > 56* 54* 55*  CREATININE 3.62*   < > 3.29* 3.14* 3.23*  CALCIUM 8.7*   < > 8.4* 8.5* 8.9  PROT 6.7  --   --   --   --   ALBUMIN 3.5  --   --   --   --   AST 20  --   --   --   --   ALT 14  --   --   --   --   ALKPHOS 86  --   --   --   --  BILITOT 3.8*  --   --   --   --   GFRNONAA 19*   < > 21* 22* 21*  ANIONGAP 11   < > 10 9 9    < > = values in this interval not displayed.    Lipids No results for input(s): "CHOL", "TRIG", "HDL", "LABVLDL", "LDLCALC", "CHOLHDL" in the last 168 hours.  Hematology Recent Labs  Lab 10/18/21 0409 10/19/21 0434 10/20/21 0537  WBC 7.4 6.9 7.8  RBC 5.04 5.41 5.85*  HGB 12.5* 13.3 14.3  HCT 40.3 42.8 46.1  MCV 80.0 79.1* 78.8*  MCH 24.8* 24.6* 24.4*  MCHC 31.0 31.1 31.0  RDW 19.4* 19.1* 19.3*  PLT 232 257 288   Thyroid  Recent Labs  Lab 10/14/21 0508  TSH 3.714    BNP Recent Labs  Lab 10/13/21 1550  BNP 2,587.7*    DDimer No results for input(s): "DDIMER" in the last 168 hours.   Radiology    No results found.  Cardiac Studies  Echocardiogram on 09/29/21 1. Left ventricular ejection fraction, by estimation, is <20%. The left  ventricle has severely decreased function. The left ventricle has no  regional wall motion abnormalities. The left ventricular internal cavity  size was moderately dilated. Left  ventricular diastolic parameters are indeterminate.   2. Right ventricular systolic function is mildly reduced. The right  ventricular size is normal. Tricuspid regurgitation signal is inadequate  for assessing  PA pressure.   3. Left atrial size was moderately dilated.   4. Right atrial size was mildly dilated.   5. The mitral valve is normal in structure. Moderate mitral valve  regurgitation. No evidence of mitral stenosis.   6. The aortic valve is normal in structure. Aortic valve regurgitation is  not visualized. Aortic valve sclerosis/calcification is present, without  any evidence of aortic stenosis.   7. The inferior vena cava is dilated in size with <50% respiratory  variability, suggesting right atrial pressure of 15 mmHg.   Patient Profile     56 y.o. male with a history of nonischemic cardiomyopathy with an EF less than 20%, paroxysmal atrial fibrillation/atrial flutter, hypertension, CKD 4 who has been seen for worsening shortness of breath, peripheral edema, who is being seen for CHF exacerbation.  Assessment & Plan    NICM EF less than 20% -Has acute on chronic combined systolic and diastolic congestive heart failure secondary to nonischemic cardiomyopathy -Recurrent admissions -Volume status is improving -Exacerbation likely in the setting of medication nonadherence -Continue carvedilol, Farxiga, and Imdur, and hydralazine  -Continue torsemide 40 mg twice daily -Daily BMP -Daily weight, strict I& os, low-sodium diet   Persistent atrial fibrillation -Rates have been controlled -Not a candidate for amiodarone given abnormal thyroid function -Previously was not a candidate for ablation per EP given medication nonadherence -CHA2DS2-VASc score of at least 3 -Continue apixaban 5 mg twice daily (does not meet reduced dosing criteria) -Continue cardiac monitoring  CKD stage IV -Serum creatinine 3.23 yesterday -Serum creatinine today before 3.14 -Baseline serum creatinine 3.1-3.4 -Daily BMP -Avoid nephrotoxic agents were able -Diuresed well off of the dose of metolazone given yesterday  Sleep apnea -Compliance with CPAP is encouraged  Medication nonadherence -Compliance  is encouraged -Likely large component of driving force to his hospital readmission and overall symptomology     For questions or updates, please contact [facility] Please consult [email or web address] for contact info under        Signed, Eidan Muellner, NP  10/20/2021, 12:21 PM

## 2021-10-20 NOTE — Discharge Summary (Signed)
Physician Discharge Summary  LEBERT LOVERN YHC:[id number] DOB: 19-Sep-1965 DOA: 10/13/2021  PCP: [name], MD  Admit date: 10/13/2021 Discharge date: 10/20/2021  Admitted From: home  Disposition:  home   Recommendations for Outpatient Follow-up:  Follow up with PCP in 1-2 weeks F/u w/ cardio, Dr. Rockey Situ, in 1-2 weeks   Home Health: no  Equipment/Devices:  Discharge Condition: stable  CODE STATUS: full  Diet recommendation: Heart Healthy   Brief/Interim Summary: HPI was taken from Dr. Sidney Ace: Hector [name] is a 56 y.o. male with medical history significant for combined systolic and diastolic CHF, stage III chronic kidney disease, essential hypertension, dyslipidemia, paroxysmal atrial fibrillation/flutter and CVA, who presented to the emergency room with acute onset of worsening dyspnea with associated orthopnea and paroxysmal nocturnal dyspnea over the last few days as well as abdominal distention and mild lower extremity edema.  He denied any cough or wheezing.  No chest pain or palpitations.  No fever or chills.  No dysuria, oliguria or hematuria or flank pain.   ED Course: When he came to the ER BP was 137/106 and later 116/92, heart rate of 116 and respiratory rate 22 and later 29 with pulse oximetry of 95% on room air.  Labs revealed mild hyponatremia 133 CO2 of 20 with glucose of 159, BUN of 72 and a creatinine of 3.62 compared to 47/3.36 on 10/02/2021, calcium 8.7.  High-sensitivity troponin I was 109 and later 95.  CBC showed no significant abnormalities.  Influenza antigens and COVID-19 PCR came back negative. EKG as reviewed by me : EKG showed atrial fibrillation with rapid ventricular sponsor of 126 with nonspecific intraventricular conduction delay, poor R wave progression, and Q waves laterally.. Imaging: Portable chest x-ray showed cardiomegaly with vascular congestion.   The patient was given 2 mg of IV morphine sulfate and 80 mg of IV Lasix.  He will be  admitted to a progressive unit bed for further evaluation and management.  As per Dr. Jimmye Norman 8/23-8/28/23: Pt presented w/ shortness of breath likely secondary to CHF exacerbation. Pt was initially treated w/ IV lasix & was later transitioned to torsemide. Pt also was given po coreg, imdur, hydralazine, & farxiga. Pt responded fairly well to above and below stated treatment. Pt will need to f/u outpatient w/ cardio, Dr. Rockey Situ, in 1-2 weeks. Pt verbalized his understanding   Discharge Diagnoses:  Principal Problem:   Acute on chronic systolic (congestive) heart failure (HCC) Active Problems:   Nonischemic cardiomyopathy: EF 20-25%   Atrial fibrillation with rapid ventricular response (HCC)   Hypothyroidism   Essential hypertension   Obstructive sleep apnea   CKD (chronic kidney disease) stage 4, GFR 15-29 ml/min (HCC)   Stroke (HCC)   Alcohol abuse   Obesity (BMI 30-39.9)   Dyslipidemia  Acute on chronic combined CHF: improving. D/c IV lasix and continue on torsemide as per cardio. Monitor I/Os. Continue on imdur, hydralazine, coreg & farxiga  Likely PAF: continue on eliquis, coreg    Abdominal pain: CT shows likely proctitis & cholelithiasis. Denies abd pain today so far. No fevers, normal WBC    HTN: continue on coreg, hydralazine, imdur    OSA: CPAP qhs    Hx CVA: continue on statin, eliquis   CKDIV: Cr is labile. Avoid nephrotoxic meds   Hx lower GI bleed: H&H are WNL   Obesity: BMI 34.3. Complicates overall care & prognosis  Discharge Instructions  Discharge Instructions     Diet - low sodium heart healthy  Complete by: As directed    Discharge instructions   Complete by: As directed    F/u w/ PCP in 1-2 weeks. F/u w/ cardio, Dr. Rockey Situ, in 1-2 weeks   Increase activity slowly   Complete by: As directed       Allergies as of 10/20/2021       Reactions   Esomeprazole Magnesium Other (See Comments)   Suspected interstitial nephritis 2018   Eggs Or  Egg-derived Products Rash        Medication List     TAKE these medications    allopurinol 100 MG tablet Commonly known as: ZYLOPRIM Take 0.5 tablets (50 mg total) by mouth daily.   apixaban 5 MG Tabs tablet Commonly known as: ELIQUIS Take 1 tablet (5 mg total) by mouth 2 (two) times daily.   atorvastatin 80 MG tablet Commonly known as: LIPITOR Take 1 tablet (80 mg total) by mouth daily.   carvedilol 6.25 MG tablet Commonly known as: COREG Take 1 tablet (6.25 mg total) by mouth 2 (two) times daily with a meal.   dapagliflozin propanediol 10 MG Tabs tablet Commonly known as: Farxiga Take 1 tablet (10 mg total) by mouth daily before breakfast.   hydrALAZINE 25 MG tablet Commonly known as: APRESOLINE Take 1 tablet (25 mg total) by mouth 3 (three) times daily.   isosorbide dinitrate 5 MG tablet Commonly known as: ISORDIL Take 1 tablet (5 mg total) by mouth 3 (three) times daily. What changed: when to take this   levothyroxine 100 MCG tablet Commonly known as: SYNTHROID Take 100 mcg by mouth daily before breakfast.   potassium chloride SA 20 MEQ tablet Commonly known as: KLOR-CON M Take 1 tablet (20 mEq total) by mouth daily.   Torsemide 40 MG Tabs Take 40 mg by mouth 2 (two) times daily. What changed:  when to take this Another medication with the same name was removed. Continue taking this medication, and follow the directions you see here.        Follow-up Information     Gollan, Kathlene November, MD Follow up.   Specialty: Cardiology Why: F/u in 1-2 weeks Contact information: [place] [address] [phone]         [name], MD Follow up.   Specialty: Family Medicine Why: F/u in 1-2 weeks Contact information: [place] [address] [phone]         [name], MD .   Specialty: Cardiology Contact information: [place]  [address] [phone]                Allergies  Allergen Reactions   Esomeprazole Magnesium Other (See Comments)    Suspected interstitial nephritis 2018   Eggs Or Egg-Derived Products Rash    Consultations: Cardio    Procedures/Studies: CT ABDOMEN PELVIS WO CONTRAST  Result Date: 10/14/2021 CLINICAL DATA:  Diffuse abdominal pain. EXAM: CT ABDOMEN AND PELVIS WITHOUT CONTRAST TECHNIQUE: Multidetector CT imaging of the abdomen and pelvis was performed following the standard protocol without IV contrast. RADIATION DOSE REDUCTION: This exam was performed according to the departmental dose-optimization program which includes automated exposure control, adjustment of the mA and/or kV according to patient size and/or use of iterative reconstruction technique. COMPARISON:  MRCP and CT abdomen pelvis dated September 10, 2021. FINDINGS: Lower chest: No acute abnormality. Similar chronic interlobular septal thickening at the lateral costophrenic sulci. Small right lower lobe nodules measuring  up to 5 mm, 1 of which are calcified, are unchanged. No follow-up imaging is recommended. Hepatobiliary: No focal liver abnormality. Small gallstones and sludge. No gallbladder wall thickening or biliary dilatation. Pancreas: Unremarkable. No pancreatic ductal dilatation or surrounding inflammatory changes. Spleen: Normal in size without focal abnormality. Adrenals/Urinary Tract: Adrenal glands are unremarkable. Multiple bilateral renal simple cysts are unchanged. No follow-up imaging is recommended. No renal calculi or hydronephrosis. Chronic circumferential bladder wall thickening is unchanged. Stomach/Bowel: Circumferential rectal wall thickening. Diffuse colonic diverticulosis. The stomach and small bowel are unremarkable. Normal appendix. Vascular/Lymphatic: Aortic atherosclerosis. No enlarged abdominal or pelvic lymph nodes. Reproductive: Prostate is unremarkable. Other: Unchanged small fat containing umbilical and  right inguinal hernias. Trace free fluid in the pelvis. No pneumoperitoneum. Musculoskeletal: No acute or significant osseous findings. IMPRESSION: 1. Circumferential rectal wall thickening, concerning for proctitis. 2. Trace free fluid in the pelvis, nonspecific. 3. Cholelithiasis. 4. Aortic Atherosclerosis (ICD10-I70.0). Electronically Signed   By: Titus Dubin M.D.   On: 10/14/2021 14:54   DG Chest Port 1 View  Result Date: 10/13/2021 CLINICAL DATA:  Shortness of breath EXAM: PORTABLE CHEST 1 VIEW COMPARISON:  09/28/2021, 02/26/2021 FINDINGS: Cardiomegaly with vascular congestion. No consolidation, pleural effusion, or pneumothorax. IMPRESSION: Cardiomegaly with vascular congestion. Electronically Signed   By: Donavan Foil M.D.   On: 10/13/2021 15:29   ECHOCARDIOGRAM COMPLETE  Result Date: 09/29/2021    ECHOCARDIOGRAM REPORT   Patient Name:   Hector [name] Date of Exam: 09/29/2021 Medical Rec #:  [id number]           Height:       71.0 in Accession #:    [id number]          Weight:       257.9 lb Date of Birth:  [birth year]           BSA:          2.349 m Patient Age:    1 years            BP:           120/105 mmHg Patient Gender: M                   HR:           87 bpm. Exam Location:  [facility] Procedure: 2D Echo, Cardiac Doppler, Color Doppler and Intracardiac            Opacification Agent Indications:     CHF-Acute Systolic M54.65  History:         Patient has prior history of Echocardiogram examinations, most                  recent 03/05/2021. Cardiomyopathy and CHF, Stroke,                  Arrythmias:Atrial Fibrillation and Atrial Flutter; Risk                  Factors:Hypertension, Dyslipidemia and ETOH.  Sonographer:     Bernadene Person RDCS Referring Phys:  [place] Diagnosing Phys: Kathlyn Sacramento MD IMPRESSIONS  1. Left ventricular ejection fraction, by estimation, is <20%. The left ventricle has severely decreased function. The left ventricle has no regional wall motion  abnormalities. The left ventricular internal cavity size was moderately dilated. Left ventricular diastolic parameters are indeterminate.  2. Right ventricular systolic function is mildly reduced. The right ventricular size is normal. Tricuspid regurgitation signal is inadequate for assessing  PA pressure.  3. Left atrial size was moderately dilated.  4. Right atrial size was mildly dilated.  5. The mitral valve is normal in structure. Moderate mitral valve regurgitation. No evidence of mitral stenosis.  6. The aortic valve is normal in structure. Aortic valve regurgitation is not visualized. Aortic valve sclerosis/calcification is present, without any evidence of aortic stenosis.  7. The inferior vena cava is dilated in size with <50% respiratory variability, suggesting right atrial pressure of 15 mmHg. FINDINGS  Left Ventricle: Left ventricular ejection fraction, by estimation, is <20%. The left ventricle has severely decreased function. The left ventricle has no regional wall motion abnormalities. Definity contrast agent was given IV to delineate the left ventricular endocardial borders. The left ventricular internal cavity size was moderately dilated. There is no left ventricular hypertrophy. Left ventricular diastolic parameters are indeterminate. Right Ventricle: The right ventricular size is normal. No increase in right ventricular wall thickness. Right ventricular systolic function is mildly reduced. Tricuspid regurgitation signal is inadequate for assessing PA pressure. The tricuspid regurgitant velocity is 2.14 m/s, and with an assumed right atrial pressure of 15 mmHg, the estimated right ventricular systolic pressure is 76.1 mmHg. Left Atrium: Left atrial size was moderately dilated. Right Atrium: Right atrial size was mildly dilated. Pericardium: There is no evidence of pericardial effusion. Mitral Valve: The mitral valve is normal in structure. Moderate mitral valve regurgitation. No evidence of mitral  valve stenosis. Tricuspid Valve: The tricuspid valve is normal in structure. Tricuspid valve regurgitation is trivial. No evidence of tricuspid stenosis. Aortic Valve: The aortic valve is normal in structure. Aortic valve regurgitation is not visualized. Aortic valve sclerosis/calcification is present, without any evidence of aortic stenosis. Pulmonic Valve: The pulmonic valve was normal in structure. Pulmonic valve regurgitation is not visualized. No evidence of pulmonic stenosis. Aorta: The aortic root is normal in size and structure. Venous: The inferior vena cava is dilated in size with less than 50% respiratory variability, suggesting right atrial pressure of 15 mmHg. IAS/Shunts: No atrial level shunt detected by color flow Doppler.  LEFT VENTRICLE PLAX 2D LVIDd:         5.84 cm LVIDs:         5.36 cm LV PW:         1.25 cm LV IVS:        1.17 cm LVOT diam:     2.20 cm LVOT Area:     3.80 cm  LV Volumes (MOD) LV vol d, MOD A2C: 128.0 ml LV vol d, MOD A4C: 131.0 ml LV vol s, MOD A2C: 83.0 ml LV vol s, MOD A4C: 95.3 ml LV SV MOD A2C:     45.0 ml LV SV MOD A4C:     131.0 ml LV SV MOD BP:      41.5 ml RIGHT VENTRICLE TAPSE (M-mode): 1.5 cm LEFT ATRIUM             Index LA diam:        4.60 cm 1.96 cm/m LA Vol (A2C):   95.9 ml 40.82 ml/m LA Vol (A4C):   94.6 ml 40.27 ml/m LA Biplane Vol: 95.1 ml 40.48 ml/m   AORTA Ao Root diam: 3.30 cm Ao Asc diam:  3.50 cm MR Peak grad:    85.4 mmHg    TRICUSPID VALVE MR Mean grad:    55.0 mmHg    TR Peak grad:   18.3 mmHg MR Vmax:         462.00 cm/s  TR  Vmax:        214.00 cm/s MR Vmean:        355.0 cm/s MR PISA:         1.01 cm     SHUNTS MR PISA Eff ROA: 8 mm        Systemic Diam: 2.20 cm MR PISA Radius:  0.40 cm Kathlyn Sacramento MD Electronically signed by Kathlyn Sacramento MD Signature Date/Time: 09/29/2021/1:06:02 PM    Final    DG Chest 2 View  Result Date: 09/28/2021 CLINICAL DATA:  Shortness of breath. EXAM: CHEST - 2 VIEW COMPARISON:  Jul 22, 2021 FINDINGS: Enlarged  cardiac silhouette. Mixed pattern pulmonary edema. There is no evidence of focal airspace consolidation, pleural effusion or pneumothorax. Osseous structures are without acute abnormality. Soft tissues are grossly normal. IMPRESSION: Enlarged cardiac silhouette with mixed pattern pulmonary edema. Electronically Signed   By: Fidela Salisbury M.D.   On: 09/28/2021 15:52   (Echo, Carotid, EGD, Colonoscopy, ERCP)    Subjective: Pt denies any complaints    Discharge Exam: Vitals:   10/20/21 0827 10/20/21 1210  BP: 101/66 105/82  Pulse: (!) 51 (!) 58  Resp: 18 18  Temp: 98.6 F (37 C) 98 F (36.7 C)  SpO2: 97%    Vitals:   10/20/21 0454 10/20/21 0500 10/20/21 0827 10/20/21 1210  BP: 91/72  101/66 105/82  Pulse: 75  (!) 51 (!) 58  Resp: 14  18 18   Temp: 98.1 F (36.7 C)  98.6 F (37 C) 98 F (36.7 C)  TempSrc: Oral     SpO2: 95%  97%   Weight:  108.5 kg    Height:        General: Pt is alert, awake, not in acute distress Cardiovascular: S1/S2 +, no rubs, no gallops Respiratory: CTA bilaterally, no wheezing, no rhonchi Abdominal: Soft, NT, obese, bowel sounds + Extremities: no cyanosis    The results of significant diagnostics from this hospitalization (including imaging, microbiology, ancillary and laboratory) are listed below for reference.     Microbiology: Recent Results (from the past 240 hour(s))  Resp Panel by RT-PCR (Flu A&B, Covid) Anterior Nasal Swab     Status: None   Collection Time: 10/13/21  3:50 PM   Specimen: Anterior Nasal Swab  Result Value Ref Range Status   SARS Coronavirus 2 by RT PCR NEGATIVE NEGATIVE Final    Comment: (NOTE) SARS-CoV-2 target nucleic acids are NOT DETECTED.  The SARS-CoV-2 RNA is generally detectable in upper respiratory specimens during the acute phase of infection. The lowest concentration of SARS-CoV-2 viral copies this assay can detect is 138 copies/mL. A negative result does not preclude SARS-Cov-2 infection and should  not be used as the sole basis for treatment or other patient management decisions. A negative result may occur with  improper specimen collection/handling, submission of specimen other than nasopharyngeal swab, presence of viral mutation(s) within the areas targeted by this assay, and inadequate number of viral copies(<138 copies/mL). A negative result must be combined with clinical observations, patient history, and epidemiological information. The expected result is Negative.  Fact Sheet for Patients:  EntrepreneurPulse.com.au  Fact Sheet for Healthcare Providers:  IncredibleEmployment.be  This test is no t yet approved or cleared by the [place] FDA and  has been authorized for detection and/or diagnosis of SARS-CoV-2 by FDA under an Emergency Use Authorization (EUA). This EUA will remain  in effect (meaning this test can be used) for the duration of the COVID-19 declaration under Section 564(b)(1)  of the Act, 21 U.S.C.section 360bbb-3(b)(1), unless the authorization is terminated  or revoked sooner.       Influenza A by PCR NEGATIVE NEGATIVE Final   Influenza B by PCR NEGATIVE NEGATIVE Final    Comment: (NOTE) The Xpert Xpress SARS-CoV-2/FLU/RSV plus assay is intended as an aid in the diagnosis of influenza from Nasopharyngeal swab specimens and should not be used as a sole basis for treatment. Nasal washings and aspirates are unacceptable for Xpert Xpress SARS-CoV-2/FLU/RSV testing.  Fact Sheet for Patients: EntrepreneurPulse.com.au  Fact Sheet for Healthcare Providers: IncredibleEmployment.be  This test is not yet approved or cleared by the [place] FDA and has been authorized for detection and/or diagnosis of SARS-CoV-2 by FDA under an Emergency Use Authorization (EUA). This EUA will remain in effect (meaning this test can be used) for the duration of the COVID-19 declaration under  Section 564(b)(1) of the Act, 21 U.S.C. section 360bbb-3(b)(1), unless the authorization is terminated or revoked.  Performed at [facility], [address] [place], [place] [address]      Labs: BNP (last 3 results) Recent Labs    07/29/21 0931 09/28/21 1403 10/13/21 1550  BNP 1,705.9* 3,806.7* 9,470.9*   Basic Metabolic Panel: Recent Labs  Lab 10/15/21 0616 10/16/21 0443 10/17/21 0522 10/18/21 0409 10/19/21 0434  NA 136 135 137 137 137  K 3.6 3.7 3.5 3.9 3.9  CL 102 102 101 102 99  CO2 24 23 26 26 29   GLUCOSE 126* 142* 113* 122* 125*  BUN 61* 60* 56* 54* 55*  CREATININE 3.39* 3.54* 3.29* 3.14* 3.23*  CALCIUM 8.0* 7.9* 8.4* 8.5* 8.9   Liver Function Tests: Recent Labs  Lab 10/13/21 1550  AST 20  ALT 14  ALKPHOS 86  BILITOT 3.8*  PROT 6.7  ALBUMIN 3.5   No results for input(s): "LIPASE", "AMYLASE" in the last 168 hours. No results for input(s): "AMMONIA" in the last 168 hours. CBC: Recent Labs  Lab 10/13/21 1550 10/14/21 0508 10/16/21 0443 10/17/21 0522 10/18/21 0409 10/19/21 0434 10/20/21 0537  WBC 8.4   < > 8.6 7.7 7.4 6.9 7.8  NEUTROABS 5.5  --   --   --   --   --   --   HGB 13.8   < > 12.7* 12.6* 12.5* 13.3 14.3  HCT 44.4   < > 40.8 39.5 40.3 42.8 46.1  MCV 80.1   < > 80.0 79.0* 80.0 79.1* 78.8*  PLT 271   < > 235 236 232 257 288   < > = values in this interval not displayed.   Cardiac Enzymes: No results for input(s): "CKTOTAL", "CKMB", "CKMBINDEX", "TROPONINI" in the last 168 hours. BNP: Invalid input(s): "POCBNP" CBG: No results for input(s): "GLUCAP" in the last 168 hours. D-Dimer No results for input(s): "DDIMER" in the last 72 hours. Hgb A1c No results for input(s): "HGBA1C" in the last 72 hours. Lipid Profile No results for input(s): "CHOL", "HDL", "LDLCALC", "TRIG", "CHOLHDL", "LDLDIRECT" in the last 72 hours. Thyroid function studies No results for input(s): "TSH", "T4TOTAL", "T3FREE", "THYROIDAB" in the last 72  hours.  Invalid input(s): "FREET3" Anemia work up No results for input(s): "VITAMINB12", "FOLATE", "FERRITIN", "TIBC", "IRON", "RETICCTPCT" in the last 72 hours. Urinalysis    Component Value Date/Time   COLORURINE YELLOW (A) 09/10/2021 1302   APPEARANCEUR HAZY (A) 09/10/2021 1302   APPEARANCEUR Clear 02/26/2014 1126   LABSPEC 1.010 09/10/2021 1302   LABSPEC 1.008 02/26/2014 1126   PHURINE 5.0 09/10/2021 1302  GLUCOSEU NEGATIVE 09/10/2021 1302   GLUCOSEU Negative 02/26/2014 1126   HGBUR MODERATE (A) 09/10/2021 1302   BILIRUBINUR NEGATIVE 09/10/2021 1302   BILIRUBINUR Negative 02/26/2014 1126   KETONESUR NEGATIVE 09/10/2021 1302   PROTEINUR >=300 (A) 09/10/2021 1302   NITRITE NEGATIVE 09/10/2021 1302   LEUKOCYTESUR SMALL (A) 09/10/2021 1302   LEUKOCYTESUR 1+ 02/26/2014 1126   Sepsis Labs Recent Labs  Lab 10/17/21 0522 10/18/21 0409 10/19/21 0434 10/20/21 0537  WBC 7.7 7.4 6.9 7.8   Microbiology Recent Results (from the past 240 hour(s))  Resp Panel by RT-PCR (Flu A&B, Covid) Anterior Nasal Swab     Status: None   Collection Time: 10/13/21  3:50 PM   Specimen: Anterior Nasal Swab  Result Value Ref Range Status   SARS Coronavirus 2 by RT PCR NEGATIVE NEGATIVE Final    Comment: (NOTE) SARS-CoV-2 target nucleic acids are NOT DETECTED.  The SARS-CoV-2 RNA is generally detectable in upper respiratory specimens during the acute phase of infection. The lowest concentration of SARS-CoV-2 viral copies this assay can detect is 138 copies/mL. A negative result does not preclude SARS-Cov-2 infection and should not be used as the sole basis for treatment or other patient management decisions. A negative result may occur with  improper specimen collection/handling, submission of specimen other than nasopharyngeal swab, presence of viral mutation(s) within the areas targeted by this assay, and inadequate number of viral copies(<138 copies/mL). A negative result must be combined  with clinical observations, patient history, and epidemiological information. The expected result is Negative.  Fact Sheet for Patients:  EntrepreneurPulse.com.au  Fact Sheet for Healthcare Providers:  IncredibleEmployment.be  This test is no t yet approved or cleared by the [place] FDA and  has been authorized for detection and/or diagnosis of SARS-CoV-2 by FDA under an Emergency Use Authorization (EUA). This EUA will remain  in effect (meaning this test can be used) for the duration of the COVID-19 declaration under Section 564(b)(1) of the Act, 21 U.S.C.section 360bbb-3(b)(1), unless the authorization is terminated  or revoked sooner.       Influenza A by PCR NEGATIVE NEGATIVE Final   Influenza B by PCR NEGATIVE NEGATIVE Final    Comment: (NOTE) The Xpert Xpress SARS-CoV-2/FLU/RSV plus assay is intended as an aid in the diagnosis of influenza from Nasopharyngeal swab specimens and should not be used as a sole basis for treatment. Nasal washings and aspirates are unacceptable for Xpert Xpress SARS-CoV-2/FLU/RSV testing.  Fact Sheet for Patients: EntrepreneurPulse.com.au  Fact Sheet for Healthcare Providers: IncredibleEmployment.be  This test is not yet approved or cleared by the [place] FDA and has been authorized for detection and/or diagnosis of SARS-CoV-2 by FDA under an Emergency Use Authorization (EUA). This EUA will remain in effect (meaning this test can be used) for the duration of the COVID-19 declaration under Section 564(b)(1) of the Act, 21 U.S.C. section 360bbb-3(b)(1), unless the authorization is terminated or revoked.  Performed at [facility], [address] [place], [place] [address]      Time coordinating discharge: Over 30 minutes  SIGNED:   Wyvonnia Dusky, MD  [facility] 10/20/2021, 1:43 PM Pager   If 7PM-7AM, please contact  night-coverage [email or web address]

## 2021-10-23 ENCOUNTER — Ambulatory Visit: Payer: Medicaid Other | Admitting: Family

## 2021-11-06 ENCOUNTER — Ambulatory Visit: Payer: Medicaid Other | Attending: Physician Assistant | Admitting: Physician Assistant

## 2021-11-07 ENCOUNTER — Ambulatory Visit: Payer: Medicaid Other | Admitting: Family

## 2021-11-10 ENCOUNTER — Telehealth (HOSPITAL_COMMUNITY): Payer: Self-pay

## 2021-11-10 ENCOUNTER — Telehealth: Payer: Self-pay | Admitting: Family

## 2021-11-10 ENCOUNTER — Ambulatory Visit: Payer: Medicaid Other | Admitting: Family

## 2021-11-10 NOTE — Telephone Encounter (Signed)
Patient did not show for his [facility] appointment on 11/10/21. Will attempt to reschedule.   

## 2021-11-10 NOTE — Telephone Encounter (Signed)
Contacted Hector [name] and he advised been doing ok.  He states has all his medications and aware of his HF clinic appt today.  He states will be there.  He states he has moved to his brothers house.  He denies needing anything.  He states still not weighing even though was given a new scale.  He states has not had time. Discussed importance of weighing but he appears not to care.  Advised him to bring medications to his appt. Today.  He states he will.    [place] [phone]

## 2021-11-17 NOTE — Progress Notes (Deleted)
Date:  11/17/2021   ID:  Hector [name], DOB November 25, 1965, MRN [id number]  Patient Location:  [address] [address]   Provider location:   [facility], [place] office  PCP:  Hector Median, MD  Cardiologist:  [facility]   No chief complaint on file.   History of Present Illness:    Hector [name] is a 56 y.o. male  past medical history of alcohol abuse nonischemic cardiomyopathy, possibly alcohol-related, cardiac cath 2016 nonobstructive CAD,  chronic combined heart failure,  hypertension,  obesity,  Paroxysmal atrial fibrillation.  Flutter Possible sleep apnea Ejection fraction in the 20s% dating back to 2009, confirmed by echo 2012, ejection fraction 45 to 50% in October 2018 Who presents today for follow-up of his nonischemic cardiomyopathy, acute on chronic diastolic and systolic CHF, paroxysmal atrial fibrillation  Last seen in clinic March 2023 Admission to the hospital August 2023 acute on chronic systolic CHF  Acute on chronic diastolic and systolic CHF/nonischemic cardiomyopathy Nonischemic cardiomyopathy, prior alcohol Admission several weeks ago for similar presentation discharged home August 10 Since that time reporting has been missing some of his morning medications as he is missing breakfast On arrival this admission with worsening shortness of breath, BNP 2600, chest x-ray vascular congestion Presentation likely exacerbated by underlying renal failure creatinine 3.5 Medication compliance encouraged -For now we will continue IV Lasix, increase up to every 8 hours At the time of discharge would consider torsemide 40 twice daily at the time of discharge High risk for readmission in the setting of renal failure and atrial fibrillation/flutter We will replete potassium   Persistent atrial fibrillation/flutter On beta-blocker, Eliquis Antiarrhythmic medication options limited in the setting of  thyroid disease, heart failure and chronic kidney disease as well as noncompliance, low blood pressure Previously evaluated by EP   Chronic renal failure stage IV Remained stable, creatinine 3.5 Contributing to CHF exacerbation Would involve nephrology in his care, close to hemodialysis   Diabetes type 2 SGLT2 inhibitor, insulin   Sleep apnea Stressed the importance of compliance with his CPAP at home    Echo 10/30/21 1. Left ventricular ejection fraction, by estimation, is <20%. The left  ventricle has severely decreased function. The left ventricle has no  regional wall motion abnormalities. The left ventricular internal cavity  size was moderately dilated. Left  ventricular diastolic parameters are indeterminate.   2. Right ventricular systolic function is mildly reduced. The right  ventricular size is normal. Tricuspid regurgitation signal is inadequate  for assessing PA pressure.   3. Left atrial size was moderately dilated.   4. Right atrial size was mildly dilated.   5. The mitral valve is normal in structure. Moderate mitral valve  regurgitation. No evidence of mitral stenosis.   6. The aortic valve is normal in structure. Aortic valve regurgitation is  not visualized. Aortic valve sclerosis/calcification is present, without  any evidence of aortic stenosis.   7. The inferior vena cava is dilated in size with <50% respiratory  variability, suggesting right atrial pressure of 15 mmHg.    TEE Cardioversion 1/23 for flutter EF <20% in the setting of flutter Right ventricular systolic function is severely reduced.  Seen by Dr Hector [name] for flutter, 04/30/21 BP was elevated "been out of his medications " He is felt not to be a good candidate for ICD, ablation  In the ER last week, GI bleed Reports no further issues  "I took my  Medications today"  Blood pressure 150/122 Denies leg edema, no abdominal distention  Sedentary, sits at home, on disability  Hydralazine  not on his list today  Lab work reviewed, creatinine 2.7  Main complaint is pain in his left forearm, nerve pain, chronic issue  EKG personally reviewed by myself on todays visit Normal sinus rhythm rate 81 bpm PACs left axis deviation  Other past medical history reviewed In the ER, 9/22, foot pain, atrial fib Off amiodarone, for elevated TSH Other documentation from emergency room doctor that he was not taking other medications appropriately such as carvedilol  In the hospital November 2021 Covid 19 pneumonia, respiratory failure, ARDS Given remdesivir, steroids,baricitinib Nephrology follow-up for renal failure Hector [name] was held secondary to renal failure Is on Lasix as needed  echocardiogram December 2021 ejection fraction 45 to 50%  EKG Feb 05 2020: NSR  In 2016 he was diagnosed with atrial flutter and was cardioverted in June 2016.  reverted to atrial fibrillation and was placed on amiodarone and Eliquis   Seen by electrophysiology in 2016 in the setting of LV dysfunction and  catheterization at that time showed nonobstructive CAD.    echo in April 2018 showed recurrent LV dysfunction with an EF of 30-35% diffuse hypokinesis in the setting of some noncompliance with medications.    Prior cardiac catheterization November 2016 Nonobstructive disease  Past Medical History:  Diagnosis Date   Alcohol abuse    Alcoholic cardiomyopathy ([place])    a. 12/2007 MV: EF 28%, no isch;  b. 8/12 Echo: EF 25-35%; c. 02/2014 Echo: EF 20-25%; d. 12/2014 Cath: minimal CAD; e. 01/2015 Echo: EF 50-55%;  d. 05/2016 Echo: EF 30-35%, diff HK, gr2 DD; e. 11/2016 Echo: EF 45-50%, diff HK; f. 09/2021 Echo: EF <20%.   Chronic combined systolic (congestive) and diastolic (congestive) heart failure ([place])    a. 05/2016 Echo: EF 30-35%, diff HK, Gr2 DD, mild MR, mod dil LA; b. 11/2016 Echo: EF 45-50%, diff HK; c. 09/2021 Echo: EF < 20%, no rwma, mildly reduced RV fxn, mod dil LA, mildly dil RA, mod MR, AoV  sclerosis.   CKD (chronic kidney disease), stage III (HCC)    Elevated troponin (chronic)    Essential hypertension    GI bleed 11/2013   Hyperlipidemia    Pancreatitis    Paroxysmal A-fib (HCC)    a. new onset s/p unsuccessful TEE/DCCV on 08/16/2014; b. CHA2DS2VASc = 3-> eliquis (freq noncompliant); c. 02/2021 s/p TEE/DCCV; d. Prev on amio->d/c 2/2 abnl TFTs.   Paroxysmal atrial flutter (HCC)    Sleep-disordered breathing    Has yet to have a sleep study   Stroke [facility])    Past Surgical History:  Procedure Laterality Date   CARDIAC CATHETERIZATION N/A 01/09/2015   Procedure: Left Heart Cath and Coronary Angiography;  Surgeon: [name], MD;  Location: [place] [facility];  Service: Cardiovascular;  Laterality: N/A;   CARDIOVERSION N/A 03/05/2021   Procedure: CARDIOVERSION;  Surgeon: Kate Sable, MD;  Location: [facility] ORS;  Service: Cardiovascular;  Laterality: N/A;   COLONOSCOPY N/A 07/22/2020   Procedure: COLONOSCOPY;  Surgeon: Toledo, Benay Pike, MD;  Location: [facility] ENDOSCOPY;  Service: Gastroenterology;  Laterality: N/A;   ELECTROPHYSIOLOGIC STUDY N/A 08/16/2014   Procedure: CARDIOVERSION;  Surgeon: [name], MD;  Location: [facility] ORS;  Service: Cardiovascular;  Laterality: N/A;   FLEXIBLE SIGMOIDOSCOPY N/A 10/10/2015   Procedure: FLEXIBLE SIGMOIDOSCOPY;  Surgeon: [name], MD;  Location: [facility] ENDOSCOPY;  Service: Endoscopy;  Laterality: N/A;  KNEE SURGERY Right    NM MYOVIEW LTD  November 2011   No ischemia or infarction. EF 50-55% ( no improvement from 2009 Myoview EF of 28%   TEE WITHOUT CARDIOVERSION N/A 08/16/2014   Procedure: TRANSESOPHAGEAL ECHOCARDIOGRAM (TEE);  Surgeon: [name], MD;  Location: [facility] ORS;  Service: Cardiovascular;  Laterality: N/A;   TEE WITHOUT CARDIOVERSION N/A 03/05/2021   Procedure: TRANSESOPHAGEAL ECHOCARDIOGRAM (TEE);  Surgeon: Kate Sable, MD;  Location: [facility] ORS;  Service: Cardiovascular;  Laterality: N/A;     No  outpatient medications have been marked as taking for the 11/18/21 encounter (Appointment) with [name], MD.     Allergies:   Esomeprazole magnesium and Eggs or egg-derived products   Social History   Tobacco Use   Smoking status: Former    Packs/day: 1.00    Years: 12.00    Total pack years: 12.00    Types: Cigarettes    Quit date: 02/23/1998    Years since quitting: 23.7   Smokeless tobacco: Never  Vaping Use   Vaping Use: Never used  Substance Use Topics   Alcohol use: Not Currently    Alcohol/week: 0.0 standard drinks of alcohol    Comment: Past heavy drinker   Drug use: No     Family Hx: The patient's family history includes Diabetes in his mother; Hyperlipidemia in his mother; Hypertension in his mother.  ROS:   Please see the history of present illness.    Review of Systems  Constitutional: Negative.   Respiratory: Negative.    Cardiovascular: Negative.   Gastrointestinal: Negative.   Musculoskeletal: Negative.   Neurological: Negative.   Psychiatric/Behavioral: Negative.    All other systems reviewed and are negative.     Labs/Other Tests and Data Reviewed:    Recent Labs: 09/30/2021: Magnesium 2.1 10/13/2021: ALT 14; B Natriuretic Peptide 2,587.7 10/14/2021: TSH 3.714 10/19/2021: BUN 55; Creatinine, Ser 3.23; Potassium 3.9; Sodium 137 10/20/2021: Hemoglobin 14.3; Platelets 288   Recent Lipid Panel Lab Results  Component Value Date/Time   CHOL 89 02/03/2020 06:02 AM   CHOL 249 (H) 10/11/2018 12:26 PM   CHOL 118 03/08/2014 04:10 AM   TRIG 125 02/03/2020 06:02 AM   TRIG 95 03/08/2014 04:10 AM   HDL 15 (L) 02/03/2020 06:02 AM   HDL 36 (L) 10/11/2018 12:26 PM   HDL 28 (L) 03/08/2014 04:10 AM   CHOLHDL 5.9 02/03/2020 06:02 AM   LDLCALC 49 02/03/2020 06:02 AM   LDLCALC 174 (H) 10/11/2018 12:26 PM   LDLCALC 71 03/08/2014 04:10 AM    Wt Readings from Last 3 Encounters:  10/20/21 239 lb 3.2 oz (108.5 kg)  10/02/21 244 lb 11.2 oz (111 kg)   09/10/21 267 lb 3.2 oz (121.2 kg)     Exam:    There were no vitals taken for this visit. Constitutional:  oriented to person, place, and time. No distress.  HENT:  Head: Grossly normal Eyes:  no discharge. No scleral icterus.  Neck: No JVD, no carotid bruits  Cardiovascular: Regular rate and rhythm, no murmurs appreciated Pulmonary/Chest: Clear to auscultation bilaterally, no wheezes or rails Abdominal: Soft.  no distension.  no tenderness.  Musculoskeletal: Normal range of motion Neurological:  normal muscle tone. Coordination normal. No atrophy Skin: Skin warm and dry Psychiatric: normal affect, pleasant  ASSESSMENT & PLAN:    Chronic systolic heart failure (HCC) echocardiogram reviewed, EF <20% when in atrial flutter Typically runs 30-35 while in normal sinus rhythm Medications refilled including Farxiga 10 mg  daily, metoprolol succinate up to 25 daily, restarted hydralazine 25 3 times daily, Lasix 40 daily Medication compliance recommended  Paroxysmal atrial flutter (HCC) Stressed the importance of taking his Eliquis Off amiodarone secondary to thyroid disease We have increased metoprolol succinate up to 25 daily  Paroxysmal atrial fibrillation ([place]) Plan as above Has been seen by EP, no ablation at this time is scheduled Concern for medication compliance  Nonischemic cardiomyopathy ([place]) Moderate to severely depressed ejection fraction Continue metoprolol succinate, restart hydralazine, continue Farxiga, continue Lasix  Obstructive sleep apnea Recommend CPAP on a regular basis Weight loss recommended  Mixed hyperlipidemia Cholesterol at goal Lipitor, compliance recommended   Total encounter time more than 30 minutes  Greater than 50% was spent in counseling and coordination of care with the patient   Signed, Ida Rogue, MD  11/17/2021 9:12 AM    [place] [facility] [phone], [place], [place]  [phone]

## 2021-11-18 ENCOUNTER — Inpatient Hospital Stay
Admission: EM | Admit: 2021-11-18 | Discharge: 2021-11-24 | DRG: 291 | Disposition: A | Discharge status: Home Health | Payer: Medicaid Other | Attending: Internal Medicine | Admitting: Internal Medicine

## 2021-11-18 ENCOUNTER — Emergency Department: Payer: Medicaid Other

## 2021-11-18 ENCOUNTER — Other Ambulatory Visit: Payer: Self-pay

## 2021-11-18 ENCOUNTER — Encounter: Payer: Self-pay | Admitting: Emergency Medicine

## 2021-11-18 ENCOUNTER — Ambulatory Visit: Payer: Medicaid Other | Admitting: Cardiovascular Disease

## 2021-11-18 DIAGNOSIS — Z87891 Personal history of nicotine dependence: Secondary | ICD-10-CM

## 2021-11-18 DIAGNOSIS — E039 Hypothyroidism, unspecified: Secondary | ICD-10-CM | POA: Diagnosis present

## 2021-11-18 DIAGNOSIS — Z8249 Family history of ischemic heart disease and other diseases of the circulatory system: Secondary | ICD-10-CM

## 2021-11-18 DIAGNOSIS — E669 Obesity, unspecified: Secondary | ICD-10-CM | POA: Diagnosis present

## 2021-11-18 DIAGNOSIS — I10 Essential (primary) hypertension: Secondary | ICD-10-CM

## 2021-11-18 DIAGNOSIS — Z91012 Allergy to eggs: Secondary | ICD-10-CM

## 2021-11-18 DIAGNOSIS — I4892 Unspecified atrial flutter: Secondary | ICD-10-CM

## 2021-11-18 DIAGNOSIS — N184 Chronic kidney disease, stage 4 (severe): Secondary | ICD-10-CM | POA: Diagnosis not present

## 2021-11-18 DIAGNOSIS — I130 Hypertensive heart and chronic kidney disease with heart failure and stage 1 through stage 4 chronic kidney disease, or unspecified chronic kidney disease: Principal | ICD-10-CM | POA: Diagnosis present

## 2021-11-18 DIAGNOSIS — Z8673 Personal history of transient ischemic attack (TIA), and cerebral infarction without residual deficits: Secondary | ICD-10-CM

## 2021-11-18 DIAGNOSIS — Z79890 Hormone replacement therapy (postmenopausal): Secondary | ICD-10-CM

## 2021-11-18 DIAGNOSIS — Z7901 Long term (current) use of anticoagulants: Secondary | ICD-10-CM

## 2021-11-18 DIAGNOSIS — Z66 Do not resuscitate: Secondary | ICD-10-CM | POA: Diagnosis present

## 2021-11-18 DIAGNOSIS — I5023 Acute on chronic systolic (congestive) heart failure: Secondary | ICD-10-CM

## 2021-11-18 DIAGNOSIS — G4733 Obstructive sleep apnea (adult) (pediatric): Secondary | ICD-10-CM

## 2021-11-18 DIAGNOSIS — I5022 Chronic systolic (congestive) heart failure: Secondary | ICD-10-CM

## 2021-11-18 DIAGNOSIS — Z79899 Other long term (current) drug therapy: Secondary | ICD-10-CM

## 2021-11-18 DIAGNOSIS — I428 Other cardiomyopathies: Secondary | ICD-10-CM

## 2021-11-18 DIAGNOSIS — E785 Hyperlipidemia, unspecified: Secondary | ICD-10-CM | POA: Diagnosis present

## 2021-11-18 DIAGNOSIS — I509 Heart failure, unspecified: Secondary | ICD-10-CM | POA: Insufficient documentation

## 2021-11-18 DIAGNOSIS — I5041 Acute combined systolic (congestive) and diastolic (congestive) heart failure: Secondary | ICD-10-CM | POA: Diagnosis present

## 2021-11-18 DIAGNOSIS — Z888 Allergy status to other drugs, medicaments and biological substances status: Secondary | ICD-10-CM

## 2021-11-18 DIAGNOSIS — I480 Paroxysmal atrial fibrillation: Secondary | ICD-10-CM

## 2021-11-18 DIAGNOSIS — E782 Mixed hyperlipidemia: Secondary | ICD-10-CM | POA: Diagnosis present

## 2021-11-18 DIAGNOSIS — M109 Gout, unspecified: Secondary | ICD-10-CM | POA: Insufficient documentation

## 2021-11-18 DIAGNOSIS — N1832 Chronic kidney disease, stage 3b: Secondary | ICD-10-CM

## 2021-11-18 DIAGNOSIS — Z6835 Body mass index (BMI) 35.0-35.9, adult: Secondary | ICD-10-CM

## 2021-11-18 DIAGNOSIS — Z833 Family history of diabetes mellitus: Secondary | ICD-10-CM

## 2021-11-18 DIAGNOSIS — I110 Hypertensive heart disease with heart failure: Secondary | ICD-10-CM

## 2021-11-18 DIAGNOSIS — Z83438 Family history of other disorder of lipoprotein metabolism and other lipidemia: Secondary | ICD-10-CM

## 2021-11-18 LAB — COMPREHENSIVE METABOLIC PANEL
ALT: 13 U/L (ref 0–44)
AST: 18 U/L (ref 15–41)
Albumin: 3.7 g/dL (ref 3.5–5.0)
Alkaline Phosphatase: 79 U/L (ref 38–126)
Anion gap: 12 (ref 5–15)
BUN: 65 mg/dL — ABNORMAL HIGH (ref 6–20)
CO2: 23 mmol/L (ref 22–32)
Calcium: 9 mg/dL (ref 8.9–10.3)
Chloride: 102 mmol/L (ref 98–111)
Creatinine, Ser: 4.35 mg/dL — ABNORMAL HIGH (ref 0.61–1.24)
GFR, Estimated: 15 mL/min — ABNORMAL LOW (ref >60)
Glucose, Bld: 105 mg/dL — ABNORMAL HIGH (ref 70–99)
Potassium: 4.9 mmol/L (ref 3.5–5.1)
Sodium: 137 mmol/L (ref 135–145)
Total Bilirubin: 3.8 mg/dL — ABNORMAL HIGH (ref 0.3–1.2)
Total Protein: 7 g/dL (ref 6.5–8.1)

## 2021-11-18 LAB — CBC
HCT: 45.5 % (ref 39.0–52.0)
Hemoglobin: 13.9 g/dL (ref 13.0–17.0)
MCH: 24.6 pg — ABNORMAL LOW (ref 26.0–34.0)
MCHC: 30.5 g/dL (ref 30.0–36.0)
MCV: 80.4 fL (ref 80.0–100.0)
Platelets: 222 10*3/uL (ref 150–400)
RBC: 5.66 MIL/uL (ref 4.22–5.81)
RDW: 22 % — ABNORMAL HIGH (ref 11.5–15.5)
WBC: 7.5 10*3/uL (ref 4.0–10.5)
nRBC: 0 % (ref 0.0–0.2)

## 2021-11-18 LAB — URINALYSIS, ROUTINE W REFLEX MICROSCOPIC
Bacteria, UA: NONE SEEN
Bilirubin Urine: NEGATIVE
Glucose, UA: >=500 mg/dL — AB
Hgb urine dipstick: NEGATIVE
Ketones, ur: NEGATIVE mg/dL
Nitrite: NEGATIVE
Protein, ur: 100 mg/dL — AB
Specific Gravity, Urine: 1.006 (ref 1.005–1.030)
pH: 7 (ref 5.0–8.0)

## 2021-11-18 LAB — LIPASE, BLOOD: Lipase: 25 U/L (ref 11–51)

## 2021-11-18 LAB — TROPONIN I (HIGH SENSITIVITY): Troponin I (High Sensitivity): 52 ng/L — ABNORMAL HIGH (ref <18)

## 2021-11-18 LAB — BRAIN NATRIURETIC PEPTIDE: B Natriuretic Peptide: 2089.8 pg/mL — ABNORMAL HIGH (ref 0.0–100.0)

## 2021-11-18 MED ORDER — LEVOTHYROXINE SODIUM 100 MCG PO TABS
100.0000 ug | ORAL_TABLET | Freq: Every day | ORAL | Status: DC
  Administered 2021-11-19 – 2021-11-24 (×6): 100 ug via ORAL
  Filled 2021-11-18 (×7): qty 1

## 2021-11-18 MED ORDER — ONDANSETRON HCL 4 MG PO TABS: 4.0000 mg | ORAL_TABLET | Freq: Four times a day (QID) | ORAL | Status: DC | PRN

## 2021-11-18 MED ORDER — ONDANSETRON HCL 4 MG/2ML IJ SOLN: 4.0000 mg | Freq: Four times a day (QID) | INTRAMUSCULAR | Status: DC | PRN

## 2021-11-18 MED ORDER — ATORVASTATIN CALCIUM 80 MG PO TABS
80.0000 mg | ORAL_TABLET | Freq: Every day | ORAL | Status: DC
  Administered 2021-11-18 – 2021-11-23 (×6): 80 mg via ORAL
  Filled 2021-11-18: qty 4
  Filled 2021-11-18 (×5): qty 1

## 2021-11-18 MED ORDER — HYDROCODONE-ACETAMINOPHEN 5-325 MG PO TABS
1.0000 | ORAL_TABLET | ORAL | Status: DC | PRN
  Administered 2021-11-18: 1 via ORAL
  Administered 2021-11-20: 2 via ORAL
  Administered 2021-11-20: 1 via ORAL
  Administered 2021-11-21: 2 via ORAL
  Administered 2021-11-22: 1 via ORAL
  Administered 2021-11-22 – 2021-11-24 (×5): 2 via ORAL
  Filled 2021-11-18: qty 2
  Filled 2021-11-18: qty 1
  Filled 2021-11-18 (×3): qty 2
  Filled 2021-11-18: qty 1
  Filled 2021-11-18 (×3): qty 2
  Filled 2021-11-18: qty 1

## 2021-11-18 MED ORDER — FUROSEMIDE 10 MG/ML IJ SOLN
40.0000 mg | Freq: Two times a day (BID) | INTRAMUSCULAR | Status: DC
  Administered 2021-11-19 – 2021-11-24 (×11): 40 mg via INTRAVENOUS
  Filled 2021-11-18 (×11): qty 4

## 2021-11-18 MED ORDER — FUROSEMIDE 10 MG/ML IJ SOLN
80.0000 mg | Freq: Once | INTRAMUSCULAR | Status: AC
  Administered 2021-11-18: 80 mg via INTRAVENOUS
  Filled 2021-11-18: qty 8

## 2021-11-18 MED ORDER — APIXABAN 5 MG PO TABS
5.0000 mg | ORAL_TABLET | Freq: Two times a day (BID) | ORAL | Status: DC
  Administered 2021-11-18 – 2021-11-24 (×12): 5 mg via ORAL
  Filled 2021-11-18 (×12): qty 1

## 2021-11-18 MED ORDER — DAPAGLIFLOZIN PROPANEDIOL 10 MG PO TABS
10.0000 mg | ORAL_TABLET | Freq: Every day | ORAL | Status: DC
  Administered 2021-11-19 – 2021-11-24 (×6): 10 mg via ORAL
  Filled 2021-11-18 (×7): qty 1

## 2021-11-18 MED ORDER — CARVEDILOL 6.25 MG PO TABS
6.2500 mg | ORAL_TABLET | Freq: Two times a day (BID) | ORAL | Status: DC
  Administered 2021-11-18 – 2021-11-24 (×12): 6.25 mg via ORAL
  Filled 2021-11-18 (×12): qty 1

## 2021-11-18 MED ORDER — ALLOPURINOL 100 MG PO TABS
50.0000 mg | ORAL_TABLET | Freq: Every day | ORAL | Status: DC
  Administered 2021-11-19 – 2021-11-24 (×6): 50 mg via ORAL
  Filled 2021-11-18 (×2): qty 1
  Filled 2021-11-18: qty 0.5
  Filled 2021-11-18 (×3): qty 1

## 2021-11-18 MED ORDER — MORPHINE SULFATE (PF) 2 MG/ML IV SOLN: 1.0000 mg | INTRAVENOUS | Status: DC | PRN

## 2021-11-18 MED ORDER — ENOXAPARIN SODIUM 40 MG/0.4ML IJ SOSY: 40.0000 mg | PREFILLED_SYRINGE | INTRAMUSCULAR | Status: DC

## 2021-11-18 MED ORDER — ISOSORBIDE DINITRATE 10 MG PO TABS
5.0000 mg | ORAL_TABLET | Freq: Three times a day (TID) | ORAL | Status: DC
  Administered 2021-11-18 – 2021-11-24 (×17): 5 mg via ORAL
  Filled 2021-11-18 (×21): qty 0.5

## 2021-11-18 MED ORDER — HYDRALAZINE HCL 25 MG PO TABS
25.0000 mg | ORAL_TABLET | Freq: Three times a day (TID) | ORAL | Status: DC
  Administered 2021-11-18 – 2021-11-24 (×17): 25 mg via ORAL
  Filled 2021-11-18 (×21): qty 1

## 2021-11-18 MED ORDER — ACETAMINOPHEN 650 MG RE SUPP: 650.0000 mg | Freq: Four times a day (QID) | RECTAL | Status: DC | PRN

## 2021-11-18 MED ORDER — ACETAMINOPHEN 325 MG PO TABS: 650.0000 mg | ORAL_TABLET | Freq: Four times a day (QID) | ORAL | Status: DC | PRN

## 2021-11-18 MED ORDER — BISACODYL 5 MG PO TBEC
5.0000 mg | DELAYED_RELEASE_TABLET | Freq: Every day | ORAL | Status: DC | PRN
  Filled 2021-11-18: qty 1

## 2021-11-18 NOTE — ED Triage Notes (Signed)
Pt c/o SOB and abd swelling and pain x 2 weeks. Denies any n/v/d.

## 2021-11-18 NOTE — H&P (Addendum)
History and Physical    COLLEEN DONAHOE GYJ:[id number] DOB: 1965/06/26 DOA: 11/18/2021  PCP: [name], MD  Patient coming from: home   Chief Complaint: shortness of breath   HPI: 56 y/o M w/ PMH of CHF, HTN, HLD, hypothyroidism, gout, a.fib, obesity who presents w/ shortness of breath x 4 days. The shortness of breath is at rest as well as with exertion. Pt has not been monitoring his fluid and salt intake but does take his medications as prescribed. Pt is unsure of who his cardiologist is. Pt denies any fever, chills, sweating, chest pain, nausea, vomiting, abd pain, dysuria, urinary frequency, urinary urgency, diarrhea or constipation.  Review of Systems: As per HPI otherwise 14 point review of systems negative.    Past Medical History:  Diagnosis Date   Alcohol abuse    Alcoholic cardiomyopathy ([place])    a. 12/2007 MV: EF 28%, no isch;  b. 8/12 Echo: EF 25-35%; c. 02/2014 Echo: EF 20-25%; d. 12/2014 Cath: minimal CAD; e. 01/2015 Echo: EF 50-55%;  d. 05/2016 Echo: EF 30-35%, diff HK, gr2 DD; e. 11/2016 Echo: EF 45-50%, diff HK; f. 09/2021 Echo: EF <20%.   Chronic combined systolic (congestive) and diastolic (congestive) heart failure ([place])    a. 05/2016 Echo: EF 30-35%, diff HK, Gr2 DD, mild MR, mod dil LA; b. 11/2016 Echo: EF 45-50%, diff HK; c. 09/2021 Echo: EF < 20%, no rwma, mildly reduced RV fxn, mod dil LA, mildly dil RA, mod MR, AoV sclerosis.   CKD (chronic kidney disease), stage III (HCC)    Elevated troponin (chronic)    Essential hypertension    GI bleed 11/2013   Hyperlipidemia    Pancreatitis    Paroxysmal A-fib (HCC)    a. new onset s/p unsuccessful TEE/DCCV on 08/16/2014; b. CHA2DS2VASc = 3-> eliquis (freq noncompliant); c. 02/2021 s/p TEE/DCCV; d. Prev on amio->d/c 2/2 abnl TFTs.   Paroxysmal atrial flutter (HCC)    Sleep-disordered breathing    Has yet to have a sleep study   Stroke [facility])     Past Surgical History:  Procedure Laterality Date   CARDIAC  CATHETERIZATION N/A 01/09/2015   Procedure: Left Heart Cath and Coronary Angiography;  Surgeon: [name], MD;  Location: [place] [facility];  Service: Cardiovascular;  Laterality: N/A;   CARDIOVERSION N/A 03/05/2021   Procedure: CARDIOVERSION;  Surgeon: Kate Sable, MD;  Location: [facility] ORS;  Service: Cardiovascular;  Laterality: N/A;   COLONOSCOPY N/A 07/22/2020   Procedure: COLONOSCOPY;  Surgeon: Toledo, Benay Pike, MD;  Location: [facility] ENDOSCOPY;  Service: Gastroenterology;  Laterality: N/A;   ELECTROPHYSIOLOGIC STUDY N/A 08/16/2014   Procedure: CARDIOVERSION;  Surgeon: [name], MD;  Location: [facility] ORS;  Service: Cardiovascular;  Laterality: N/A;   FLEXIBLE SIGMOIDOSCOPY N/A 10/10/2015   Procedure: FLEXIBLE SIGMOIDOSCOPY;  Surgeon: [name], MD;  Location: [facility] ENDOSCOPY;  Service: Endoscopy;  Laterality: N/A;   KNEE SURGERY Right    NM MYOVIEW LTD  November 2011   No ischemia or infarction. EF 50-55% ( no improvement from 2009 Myoview EF of 28%   TEE WITHOUT CARDIOVERSION N/A 08/16/2014   Procedure: TRANSESOPHAGEAL ECHOCARDIOGRAM (TEE);  Surgeon: [name], MD;  Location: [facility] ORS;  Service: Cardiovascular;  Laterality: N/A;   TEE WITHOUT CARDIOVERSION N/A 03/05/2021   Procedure: TRANSESOPHAGEAL ECHOCARDIOGRAM (TEE);  Surgeon: Kate Sable, MD;  Location: [facility] ORS;  Service: Cardiovascular;  Laterality: N/A;     reports that he quit smoking about 23 years ago. His  smoking use included cigarettes. He has a 12.00 pack-year smoking history. He has never used smokeless tobacco. He reports that he does not currently use alcohol. He reports that he does not use drugs.  Allergies  Allergen Reactions   Esomeprazole Magnesium Other (See Comments)    Suspected interstitial nephritis 2018   Eggs Or Egg-Derived Products Rash    Family History  Problem Relation Age of Onset   Hypertension Mother    Hyperlipidemia Mother    Diabetes Mother    Prior to  Admission medications   Medication Sig Start Date End Date Taking? Authorizing Provider  allopurinol (ZYLOPRIM) 100 MG tablet Take 0.5 tablets (50 mg total) by mouth daily. 10/03/21   Danford, Suann Larry, MD  apixaban (ELIQUIS) 5 MG TABS tablet Take 1 tablet (5 mg total) by mouth 2 (two) times daily. 10/02/21   Danford, Suann Larry, MD  atorvastatin (LIPITOR) 80 MG tablet Take 1 tablet (80 mg total) by mouth daily. 05/13/21   [name], MD  carvedilol (COREG) 6.25 MG tablet Take 1 tablet (6.25 mg total) by mouth 2 (two) times daily with a meal. 10/02/21   Danford, Suann Larry, MD  dapagliflozin propanediol (FARXIGA) 10 MG TABS tablet Take 1 tablet (10 mg total) by mouth daily before breakfast. 10/02/21   Danford, Suann Larry, MD  hydrALAZINE (APRESOLINE) 25 MG tablet Take 1 tablet (25 mg total) by mouth 3 (three) times daily. 10/02/21   Danford, Suann Larry, MD  isosorbide dinitrate (ISORDIL) 5 MG tablet Take 1 tablet (5 mg total) by mouth 3 (three) times daily. 10/20/21 11/19/21  Wyvonnia Dusky, MD  levothyroxine (SYNTHROID) 100 MCG tablet Take 100 mcg by mouth daily before breakfast.    [provider]  potassium chloride SA (KLOR-CON M) 20 MEQ tablet Take 1 tablet (20 mEq total) by mouth daily. 10/20/21 11/19/21  Wyvonnia Dusky, MD  torsemide 40 MG TABS Take 40 mg by mouth 2 (two) times daily. 10/20/21 11/19/21  Wyvonnia Dusky, MD    Physical Exam: Vitals:   11/18/21 1229 11/18/21 1230 11/18/21 1233 11/18/21 1630  BP:   (!) 120/106 (!) 120/102  Pulse: 73     Resp: 18   14  Temp: 97.6 F (36.4 C)     TempSrc: Oral     SpO2: 97%   96%  Weight:  114.3 kg    Height:  5\' 11"  (1.803 m)      Constitutional: NAD, calm, comfortable Vitals:   11/18/21 1229 11/18/21 1230 11/18/21 1233 11/18/21 1630  BP:   (!) 120/106 (!) 120/102  Pulse: 73     Resp: 18   14  Temp: 97.6 F (36.4 C)     TempSrc: Oral     SpO2: 97%   96%  Weight:  114.3 kg    Height:  5\' 11"   (1.803 m)     Eyes: PERRL, lids and conjunctivae normal ENMT: Mucous membranes are moist. Posterior pharynx clear of any exudate or lesions. Neck: normal, supple Respiratory: diminished breath sounds b/l. No wheezes   Cardiovascular: irregularly irregular, no rubs / gallops.  Abdomen: soft, no tenderness, obese. Bowel sounds positive.  Musculoskeletal: no clubbing / cyanosis. No joint deformity upper and lower extremities.  Skin: no rashes, lesions, ulcers. Neurologic: CN 2-12 grossly intact. Moves all extremities  Psychiatric: Normal judgment and insight. Alert and oriented x 3. Normal mood.     Labs on Admission: I have personally reviewed following labs and imaging studies  CBC: Recent Labs  Lab 11/18/21 1234  WBC 7.5  HGB 13.9  HCT 45.5  MCV 80.4  PLT 801   Basic Metabolic Panel: Recent Labs  Lab 11/18/21 1234  NA 137  K 4.9  CL 102  CO2 23  GLUCOSE 105*  BUN 65*  CREATININE 4.35*  CALCIUM 9.0   GFR: Estimated Creatinine Clearance: 24.1 mL/min (A) (by C-G formula based on SCr of 4.35 mg/dL (H)). Liver Function Tests: Recent Labs  Lab 11/18/21 1234  AST 18  ALT 13  ALKPHOS 79  BILITOT 3.8*  PROT 7.0  ALBUMIN 3.7   Recent Labs  Lab 11/18/21 1234  LIPASE 25   No results for input(s): "AMMONIA" in the last 168 hours. Coagulation Profile: No results for input(s): "INR", "PROTIME" in the last 168 hours. Cardiac Enzymes: No results for input(s): "CKTOTAL", "CKMB", "CKMBINDEX", "TROPONINI" in the last 168 hours. BNP (last 3 results) No results for input(s): "PROBNP" in the last 8760 hours. HbA1C: No results for input(s): "HGBA1C" in the last 72 hours. CBG: No results for input(s): "GLUCAP" in the last 168 hours. Lipid Profile: No results for input(s): "CHOL", "HDL", "LDLCALC", "TRIG", "CHOLHDL", "LDLDIRECT" in the last 72 hours. Thyroid Function Tests: No results for input(s): "TSH", "T4TOTAL", "FREET4", "T3FREE", "THYROIDAB" in the last 72  hours. Anemia Panel: No results for input(s): "VITAMINB12", "FOLATE", "FERRITIN", "TIBC", "IRON", "RETICCTPCT" in the last 72 hours. Urine analysis:    Component Value Date/Time   COLORURINE YELLOW (A) 11/18/2021 1234   APPEARANCEUR CLEAR (A) 11/18/2021 1234   APPEARANCEUR Clear 02/26/2014 1126   LABSPEC 1.006 11/18/2021 1234   LABSPEC 1.008 02/26/2014 1126   PHURINE 7.0 11/18/2021 1234   GLUCOSEU >=500 (A) 11/18/2021 1234   GLUCOSEU Negative 02/26/2014 1126   HGBUR NEGATIVE 11/18/2021 [place] 11/18/2021 1234   BILIRUBINUR Negative 02/26/2014 1126   KETONESUR NEGATIVE 11/18/2021 1234   PROTEINUR 100 (A) 11/18/2021 1234   NITRITE NEGATIVE 11/18/2021 1234   LEUKOCYTESUR SMALL (A) 11/18/2021 1234   LEUKOCYTESUR 1+ 02/26/2014 1126    Radiological Exams on Admission: DG Chest 2 View  Result Date: 11/18/2021 CLINICAL DATA:  Shortness of breath, abdominal swelling and pain x2 weeks. EXAM: CHEST - 2 VIEW COMPARISON:  Chest radiograph October 13, 2021. FINDINGS: Similar cardiomegaly and central vascular congestion. Bibasilar and perihilar predominant interstitial opacities likely reflect edema. Tiny right pleural effusion. No pneumothorax. The visualized skeletal structures are unchanged. IMPRESSION: Similar cardiomegaly and central vascular congestion with probable mild interstitial edema and trace right pleural effusion. Electronically Signed   By: Dahlia Bailiff M.D.   On: 11/18/2021 13:08    EKG: Independently reviewed.   Assessment/Plan Principal Problem:   CHF (congestive heart failure) (HCC) Active Problems:   Hypothyroidism   Hyperlipidemia   Paroxysmal atrial flutter (HCC)   Essential hypertension   CKD (chronic kidney disease) stage 4, GFR 15-29 ml/min (HCC)   CKD (chronic kidney disease), stage IV (HCC)   Obesity (BMI 30-39.9)   Gout   Acute on chronic systolic CHF exacerbation: continue on IV lasix. Continue on home dose of coreg, isosorbide  dinitrate, hydralazine & farxiga. Holding home dose of torsemide. Monitor I/Os. Fluid restriction & daily weights. BNP is 2,089. CXR shows central vascular congestion & mild interstitial edema. Will hold off on echo as last echo was done August 6553 (diastolic function was indeterminate)   HTN: continue on home dose of coreg, hydralzine, isordil  CKDIV: will monitor Cr/GFR closely while on lasix.  A.fib: likely PAF. Continue on home dose of coreg, eliquis  HLD: continue on statin   Hypothyroidism: continue on home dose of levothyroxine  Gout: continue on home dose of allopurinol  Obesity: BMI 35.1. Complicates overall care & prognosis. HbA1c ordered.   DVT prophylaxis: eliquis Code Status: DNR  Family Communication: no family at bedside Disposition Plan: likely d/c back home Consults called: none Admission status: observation    Wyvonnia Dusky MD [facility]   If 7PM-7AM, please contact night-coverage   11/18/2021, 5:26 PM

## 2021-11-18 NOTE — ED Triage Notes (Signed)
First nurse Note:  Arrives with c/o abdominal swelling.  AAOx3.  Skin warm and dry. NAD

## 2021-11-18 NOTE — ED Provider Notes (Signed)
[facility] Provider Note    Event Date/Time   First MD Initiated Contact with Patient 11/18/21 1600     (approximate)   History   Shortness of Breath and Abdominal Pain   HPI  Hector [name] is a 56 y.o. male with a history of CHF, CKD, hypertension, dyslipidemia, atrial fibrillation, and CVA who presents with worsening shortness of breath over the last 2 weeks occurring at rest and with minimal exertion.  He reports increased swelling to his abdomen and legs as well.  He states he is compliant with his torsemide and other medications but they are not helping.  He denies chest pain, cough, or fever.  I reviewed the past medical records.  Per the hospitalist discharge summary from 10/20/2021, the patient was admitted at that time with a similar presentation of increased shortness of breath and was found to be in a CHF exacerbation.  He was given IV Lasix and admitted for 1 week.    Physical Exam   Triage Vital Signs: ED Triage Vitals  Enc Vitals Group     BP 11/18/21 1233 (!) 120/106     Pulse Rate 11/18/21 1229 73     Resp 11/18/21 1229 18     Temp 11/18/21 1229 97.6 F (36.4 C)     Temp Source 11/18/21 1229 Oral     SpO2 11/18/21 1229 97 %     Weight 11/18/21 1230 252 lb (114.3 kg)     Height 11/18/21 1230 5\' 11"  (1.803 m)     Head Circumference --      Peak Flow --      Pain Score 11/18/21 1230 7     Pain Loc --      Pain Edu? --      Excl. in [place]? --     Most recent vital signs: Vitals:   11/18/21 1233 11/18/21 1630  BP: (!) 120/106 (!) 120/102  Pulse:    Resp:  14  Temp:    SpO2:  96%     General: Awake, no distress.  CV:  Good peripheral perfusion.  Resp:  Increased respiratory effort.  Faint rales to bilateral bases. Abd:  No distention.  Other:  1+ bilateral lower extremity edema.  Mild abdominal wall edema.   ED Results / Procedures / Treatments   Labs (all labs ordered are listed, but only abnormal results are  displayed) Labs Reviewed  COMPREHENSIVE METABOLIC PANEL - Abnormal; Notable for the following components:      Result Value   Glucose, Bld 105 (*)    BUN 65 (*)    Creatinine, Ser 4.35 (*)    Total Bilirubin 3.8 (*)    GFR, Estimated 15 (*)    All other components within normal limits  CBC - Abnormal; Notable for the following components:   MCH 24.6 (*)    RDW 22.0 (*)    All other components within normal limits  URINALYSIS, ROUTINE W REFLEX MICROSCOPIC - Abnormal; Notable for the following components:   Color, Urine YELLOW (*)    APPearance CLEAR (*)    Glucose, UA >=500 (*)    Protein, ur 100 (*)    Leukocytes,Ua SMALL (*)    All other components within normal limits  BRAIN NATRIURETIC PEPTIDE - Abnormal; Notable for the following components:   B Natriuretic Peptide 2,089.8 (*)    All other components within normal limits  TROPONIN I (HIGH SENSITIVITY) - Abnormal; Notable for the following components:  Troponin I (High Sensitivity) 52 (*)    All other components within normal limits  LIPASE, BLOOD     EKG  ED ECG REPORT I, Arta Silence, the attending physician, personally viewed and interpreted this ECG.  Date: 11/18/2021 EKG Time: 1243 Rate: 88 Rhythm: normal sinus rhythm QRS Axis: normal Intervals: LAFB, prolonged QTc ST/T Wave abnormalities: normal Narrative Interpretation: no evidence of acute ischemia    RADIOLOGY  Chest x-ray: I independently viewed and interpreted the images; there is cardiac delay and bilateral interstitial opacity consistent with edema  PROCEDURES:  Critical Care performed: No  Procedures   MEDICATIONS ORDERED IN ED: Medications  furosemide (LASIX) injection 80 mg (has no administration in time range)     IMPRESSION / MDM / ASSESSMENT AND PLAN / ED COURSE  I reviewed the triage vital signs and the nursing notes.  56 year old male with PMH as noted above including CHF with a recent admission presents with recurrent  worsening shortness of breath and edema.  Differential diagnosis includes, but is not limited to, CHF exacerbation, AKI, fluid overload due to other etiology.  Chest x-ray shows evidence of edema.  BNP is elevated.  Troponin is minimally elevated.  Creatinine is somewhat elevated from baseline although the patient has CKD.  Overall presentation is consistent with CHF exacerbation.  We will give IV Lasix for diuresis.  The patient has increased work of breathing with minimal exertion.  Although he is not currently hypoxic at rest, he will need inpatient admission for further management.  I consulted Dr. Jimmye Norman from the hospitalist service; based on our discussion she agrees to admit the patient.  Patient's presentation is most consistent with acute presentation with potential threat to life or bodily function.  The patient is on the cardiac monitor to evaluate for evidence of arrhythmia and/or significant heart rate changes.    FINAL CLINICAL IMPRESSION(S) / ED DIAGNOSES   Final diagnoses:  Acute on chronic congestive heart failure, unspecified heart failure type ([place])     Rx / DC Orders   ED Discharge Orders     None        Note:  This document was prepared using Dragon voice recognition software and may include unintentional dictation errors.    Arta Silence, MD 11/18/21 [phone]

## 2021-11-18 NOTE — ED Provider Triage Note (Signed)
  Emergency Medicine Provider Triage Evaluation Note  Hector [name] , a 56 y.o.male,  was evaluated in triage.  Pt complains of shortness of breath and abdominal swelling x2 weeks.  He states he was recently seen here and was admitted for a few days after having fluid drained off of his abdomen.  He states that this feels the same.   Review of Systems  Positive: Shortness of breath, abdominal swelling Negative: Denies fever, chest pain, vomiting  Physical Exam   Vitals:   11/18/21 1229 11/18/21 1233  BP:  (!) 120/106  Pulse: 73   Resp: 18   Temp: 97.6 F (36.4 C)   SpO2: 97%    Gen:   Awake, no distress   Resp:  Normal effort  MSK:   Moves extremities without difficulty  Other:  Moderate distention in the abdomen.  Medical Decision Making  Given the patient's initial medical screening exam, the following diagnostic evaluation has been ordered. The patient will be placed in the appropriate treatment space, once one is available, to complete the evaluation and treatment. I have discussed the plan of care with the patient and I have advised the patient that an ED physician or mid-level practitioner will reevaluate their condition after the test results have been received, as the results may give them additional insight into the type of treatment they may need.    Diagnostics: Labs, EKG, CXR, UA  Treatments: none immediately   Teodoro Spray, [place] 11/18/21 1436

## 2021-11-19 DIAGNOSIS — Z6835 Body mass index (BMI) 35.0-35.9, adult: Secondary | ICD-10-CM | POA: Diagnosis not present

## 2021-11-19 DIAGNOSIS — Z79890 Hormone replacement therapy (postmenopausal): Secondary | ICD-10-CM | POA: Diagnosis not present

## 2021-11-19 DIAGNOSIS — E785 Hyperlipidemia, unspecified: Secondary | ICD-10-CM | POA: Diagnosis present

## 2021-11-19 DIAGNOSIS — Z888 Allergy status to other drugs, medicaments and biological substances status: Secondary | ICD-10-CM | POA: Diagnosis not present

## 2021-11-19 DIAGNOSIS — I480 Paroxysmal atrial fibrillation: Secondary | ICD-10-CM | POA: Diagnosis present

## 2021-11-19 DIAGNOSIS — M109 Gout, unspecified: Secondary | ICD-10-CM | POA: Diagnosis present

## 2021-11-19 DIAGNOSIS — I5023 Acute on chronic systolic (congestive) heart failure: Secondary | ICD-10-CM | POA: Diagnosis present

## 2021-11-19 DIAGNOSIS — E669 Obesity, unspecified: Secondary | ICD-10-CM | POA: Diagnosis not present

## 2021-11-19 DIAGNOSIS — N184 Chronic kidney disease, stage 4 (severe): Secondary | ICD-10-CM | POA: Diagnosis present

## 2021-11-19 DIAGNOSIS — I5041 Acute combined systolic (congestive) and diastolic (congestive) heart failure: Secondary | ICD-10-CM | POA: Diagnosis present

## 2021-11-19 DIAGNOSIS — I509 Heart failure, unspecified: Secondary | ICD-10-CM | POA: Diagnosis present

## 2021-11-19 DIAGNOSIS — Z91012 Allergy to eggs: Secondary | ICD-10-CM | POA: Diagnosis not present

## 2021-11-19 DIAGNOSIS — I4892 Unspecified atrial flutter: Secondary | ICD-10-CM

## 2021-11-19 DIAGNOSIS — Z833 Family history of diabetes mellitus: Secondary | ICD-10-CM | POA: Diagnosis not present

## 2021-11-19 DIAGNOSIS — Z87891 Personal history of nicotine dependence: Secondary | ICD-10-CM | POA: Diagnosis not present

## 2021-11-19 DIAGNOSIS — Z79899 Other long term (current) drug therapy: Secondary | ICD-10-CM | POA: Diagnosis not present

## 2021-11-19 DIAGNOSIS — Z8673 Personal history of transient ischemic attack (TIA), and cerebral infarction without residual deficits: Secondary | ICD-10-CM | POA: Diagnosis not present

## 2021-11-19 DIAGNOSIS — I130 Hypertensive heart and chronic kidney disease with heart failure and stage 1 through stage 4 chronic kidney disease, or unspecified chronic kidney disease: Secondary | ICD-10-CM | POA: Diagnosis present

## 2021-11-19 DIAGNOSIS — Z8249 Family history of ischemic heart disease and other diseases of the circulatory system: Secondary | ICD-10-CM | POA: Diagnosis not present

## 2021-11-19 DIAGNOSIS — E039 Hypothyroidism, unspecified: Secondary | ICD-10-CM | POA: Diagnosis present

## 2021-11-19 DIAGNOSIS — I10 Essential (primary) hypertension: Secondary | ICD-10-CM | POA: Diagnosis not present

## 2021-11-19 DIAGNOSIS — Z7901 Long term (current) use of anticoagulants: Secondary | ICD-10-CM | POA: Diagnosis not present

## 2021-11-19 DIAGNOSIS — Z66 Do not resuscitate: Secondary | ICD-10-CM | POA: Diagnosis present

## 2021-11-19 DIAGNOSIS — Z83438 Family history of other disorder of lipoprotein metabolism and other lipidemia: Secondary | ICD-10-CM | POA: Diagnosis not present

## 2021-11-19 LAB — CBC
HCT: 44.3 % (ref 39.0–52.0)
Hemoglobin: 13.8 g/dL (ref 13.0–17.0)
MCH: 24.8 pg — ABNORMAL LOW (ref 26.0–34.0)
MCHC: 31.2 g/dL (ref 30.0–36.0)
MCV: 79.5 fL — ABNORMAL LOW (ref 80.0–100.0)
Platelets: 199 10*3/uL (ref 150–400)
RBC: 5.57 MIL/uL (ref 4.22–5.81)
RDW: 22 % — ABNORMAL HIGH (ref 11.5–15.5)
WBC: 7.5 10*3/uL (ref 4.0–10.5)
nRBC: 0 % (ref 0.0–0.2)

## 2021-11-19 LAB — BASIC METABOLIC PANEL
Anion gap: 13 (ref 5–15)
BUN: 67 mg/dL — ABNORMAL HIGH (ref 6–20)
CO2: 23 mmol/L (ref 22–32)
Calcium: 8.3 mg/dL — ABNORMAL LOW (ref 8.9–10.3)
Chloride: 103 mmol/L (ref 98–111)
Creatinine, Ser: 3.95 mg/dL — ABNORMAL HIGH (ref 0.61–1.24)
GFR, Estimated: 17 mL/min — ABNORMAL LOW (ref >60)
Glucose, Bld: 87 mg/dL (ref 70–99)
Potassium: 4.1 mmol/L (ref 3.5–5.1)
Sodium: 139 mmol/L (ref 135–145)

## 2021-11-19 LAB — HEMOGLOBIN A1C
Hgb A1c MFr Bld: 7.2 % — ABNORMAL HIGH (ref 4.8–5.6)
Mean Plasma Glucose: 159.94 mg/dL

## 2021-11-19 MED ORDER — ORAL CARE MOUTH RINSE: 15.0000 mL | OROMUCOSAL | Status: DC | PRN

## 2021-11-19 NOTE — Assessment & Plan Note (Signed)
Looks to be at baseline 

## 2021-11-19 NOTE — ED Notes (Signed)
Pt sitting on the side of the bed eating dinner. No needs at this time.

## 2021-11-19 NOTE — Discharge Instructions (Signed)

## 2021-11-19 NOTE — Assessment & Plan Note (Signed)
Slow rate atrial fibrillation, continue with apixaban as tolerated.  

## 2021-11-19 NOTE — Consult Note (Signed)
   Heart Failure Nurse Navigator Note  HFrEF less than 20%.  Diastolic dysfunction was indeterminate.  Right ventricular systolic function mildly reduced.  Moderate left atrial enlargement and mild right atrial enlargement.  He presented to the emergency room with complaints of worsening shortness of breath, abdomen abdominal girth increasing.  BNP elevated at 2089.  Chest x-ray with cardiomegaly and central vascular congestion.  Comorbidities:  Hypertension Hyperlipidemia Hypothyroidism Atrial fibrillation Morbid obesity Gout  Medications:  Eliquis 5 mg 2 times a day Atorvastatin 80 mg daily Carvedilol 6.25 mg 2 times a day Farxiga 10 mg daily Furosemide 40 mg IV twice a day Hydralazine 25 mg every 8 hours Isosorbide dinitrate 5 mg 3 times a day Levothyroxine 100 mcg daily  Labs:  Sodium 139, potassium 4.1, chloride 103, CO2 23, BUN 67, creatinine 3.95, estimated GFR 17 Weight is 114.3 kg blood pressure 103/89   Met with patient in the emergency room, he was sitting on the edge of the bed eating lunch.  He is currently on room air ,in no acute distress.  He was last discharged from the hospital on August 28,2023 after being admitted for heart failure.  He no showed for his heart failure clinic appointment on September 18, after he had confirmed with para medicine  that he was coming to the appointment.  States that he had recently moved and a lot of his belongings are still boxed up.  Discussed the importance of finding the scale and weighing himself daily.  Reporting 2-3 pound weight gain over night. Patient states that he noted that his abdomen was getting larger but did not realize that that was a symptom of heart failure.  He states he did not see that much  swelling in his legs or feet.  He has follow up appointment on October 5 at 2:30 PM.  He has a no show ratio of 11%, 13 of 119 appointments.  Pricilla Riffle RN [facility]

## 2021-11-19 NOTE — Hospital Course (Addendum)
56 year old male with past medical history of systolic/diastolic CHF with ejection fraction less than 20% with stage IV chronic kidney disease, atrial fibrillation and obesity who presented to the emergency room on 9/26 with shortness of breath and admitted for CHF exacerbation.  By day of discharge, patient had diuresed over 17 L and was almost -12 L deficient.  At this point, felt to have fully diuresed and back to his baseline.  Felt to be stable for discharge.

## 2021-11-19 NOTE — Progress Notes (Signed)
Nutrition Brief Note  RD pulled to chart secondary to CHF diagnosis.   Wt Readings from Last 15 Encounters:  11/18/21 114.3 kg  10/20/21 108.5 kg  10/02/21 111 kg  09/10/21 121.2 kg  08/19/21 121.2 kg  07/29/21 123.4 kg  07/28/21 123.8 kg  05/23/21 126.2 kg  05/13/21 121.3 kg  05/06/21 123.8 kg  04/30/21 125.2 kg  04/30/21 127 kg  04/21/21 121.7 kg  04/03/21 111.1 kg  03/25/21 119.7 kg   Pt with PMH of CHF, HTN, HLD, hypothyroidism, gout, a.fib, obesity who presents w/ shortness of breath x 4 days.  Pt admitted with CHF exacerbation.  RD provided "Low Sodium Nutrition Therapy" handout from [facility] Nutrition Care Manual; attached to AVS/ discharge summary.   Medications reviewed and include lasix.   Body mass index is 35.15 kg/m. Patient meets criteria for obesity class I based on current BMI.   Current diet order is Heart Healthy with 1.5 L fluid restriction (liberalized to 2 gram), patient is consuming approximately n/a% of meals at this time. Labs and medications reviewed.   No nutrition interventions warranted at this time. If nutrition issues arise, please consult RD.   Loistine Chance, RD, LDN, [place] Registered Dietitian II Certified Diabetes Care and Education Specialist Please refer to [facility] for RD and/or RD on-call/weekend/after hours pager

## 2021-11-19 NOTE — Assessment & Plan Note (Addendum)
Blood pressure initially elevated, but greatly improved with diuresis.

## 2021-11-19 NOTE — Assessment & Plan Note (Signed)
Continue statin. 

## 2021-11-19 NOTE — Progress Notes (Signed)
[facility] Progress Note  Patient: Hector [name]    MVH:[id number]  DOA: 11/18/2021    Date of Service: the patient was seen and examined on 11/19/2021  Brief hospital course: 56 year old male with past medical history of systolic/diastolic CHF with ejection fraction less than 20% with stage III chronic kidney disease, atrial fibrillation and obesity who presented to the emergency room on 9/26 with shortness of breath and admitted for CHF exacerbation.  Assessment and Plan: Assessment and Plan: Acute on chronic systolic (congestive) heart failure (HCC) Continue IV Lasix.  Echocardiogram done at the beginning of August noted ejection fraction of less than 20% with indeterminate diastolic function.  Monitor intake and output.  Patient already clinically feeling better.  CKD (chronic kidney disease) stage 4, GFR 15-29 ml/min (HCC) Looks to be at baseline  Essential hypertension Blood pressure staying stable.  She greatly improved with the diuresis.  Paroxysmal atrial flutter (HCC) Rate controlled.  Continue Eliquis.  Hyperlipidemia Continue statin  Hypothyroidism Continue Synthroid  Obesity (BMI 30-39.9) Meets criteria with BMI greater than 30       Body mass index is 35.15 kg/m.        Consultants: None  Procedures: None  Antimicrobials: None  Code Status: Full code   Subjective: Patient states breathing is better  Objective: Vital signs were reviewed and unremarkable. Vitals:   11/19/21 1325 11/19/21 1445  BP: 103/89 104/89  Pulse: 99   Resp: (!) 24   Temp:    SpO2: 92%     Intake/Output Summary (Last 24 hours) at 11/19/2021 1552 Last data filed at 11/18/2021 2004 Gross per 24 hour  Intake 360 ml  Output --  Net 360 ml   Filed Weights   11/18/21 1230  Weight: 114.3 kg   Body mass index is 35.15 kg/m.  Exam:  General: Alert and oriented x3, no acute distress HEENT: Normocephalic, atraumatic, mucous membranes are  moist Cardiovascular: Regular rate and rhythm, S1-S2, 2 out of 6 systolic ejection murmur Respiratory: Decreased breath sounds bibasilar Abdomen: Soft, nontender, nondistended, positive bowel sounds in Musculoskeletal: Clubbing or cyanosis, 1+ pitting edema bilaterally Skin: No skin breaks, tears or lesions Psychiatry: Appropriate, no evidence of psychoses Neurology: No focal deficits  Data Reviewed: BNP at 2100.  Creatinine at baseline  Disposition:  Status is: Inpatient    Anticipated discharge date: 9/29  Remaining issues to be resolved so that patient can be discharged:  -Further diuresis   Family Communication: Declined for me to call family DVT Prophylaxis: SCDs Start: 11/18/21 1706 apixaban (ELIQUIS) tablet 5 mg    Author: Annita Brod ,MD 11/19/2021 3:52 PM  To reach On-call, see care teams to locate the attending and reach out via [email or web address]. Between 7PM-7AM, please contact night-coverage If you still have difficulty reaching the attending provider, please page the [facility] (Director on Call) for [facility] on amion for assistance.

## 2021-11-19 NOTE — Assessment & Plan Note (Addendum)
Continue IV Lasix.  Echocardiogram done at the beginning of August noted ejection fraction of less than 20% with indeterminate diastolic function.  Patient has diuresed over 15 L and is almost -12 L deficient.  Weight is down almost 7 pounds although I suspect it is more than this.  With creatinine starting to trend back up after was trending down, feel patient is close to baseline in terms of dry weight.

## 2021-11-19 NOTE — Assessment & Plan Note (Signed)
Meets criteria with BMI greater than 30 ?

## 2021-11-19 NOTE — Assessment & Plan Note (Signed)
Continue Synthroid °

## 2021-11-20 DIAGNOSIS — I5023 Acute on chronic systolic (congestive) heart failure: Secondary | ICD-10-CM | POA: Diagnosis not present

## 2021-11-20 DIAGNOSIS — N184 Chronic kidney disease, stage 4 (severe): Secondary | ICD-10-CM | POA: Diagnosis not present

## 2021-11-20 DIAGNOSIS — E669 Obesity, unspecified: Secondary | ICD-10-CM | POA: Diagnosis not present

## 2021-11-20 DIAGNOSIS — I4892 Unspecified atrial flutter: Secondary | ICD-10-CM | POA: Diagnosis not present

## 2021-11-20 LAB — BASIC METABOLIC PANEL
Anion gap: 9 (ref 5–15)
BUN: 61 mg/dL — ABNORMAL HIGH (ref 6–20)
CO2: 25 mmol/L (ref 22–32)
Calcium: 8.1 mg/dL — ABNORMAL LOW (ref 8.9–10.3)
Chloride: 103 mmol/L (ref 98–111)
Creatinine, Ser: 4.03 mg/dL — ABNORMAL HIGH (ref 0.61–1.24)
GFR, Estimated: 16 mL/min — ABNORMAL LOW (ref >60)
Glucose, Bld: 111 mg/dL — ABNORMAL HIGH (ref 70–99)
Potassium: 4.1 mmol/L (ref 3.5–5.1)
Sodium: 137 mmol/L (ref 135–145)

## 2021-11-20 NOTE — Progress Notes (Signed)
   Heart Failure Nurse Navigator Note  Met with patient today he was sitting on the edge of the bed having just taken his morning medications.  He states that he is not quite back to his normal self, still feeling a little short of breath and feeling like he still has some fluid in his abdomen.  Discussed since he has moved in with his brother that they are eating at restaurants more.  Talked about the choices that he was making and that he could do to make wiser choices when eating at a restaurant.  He also states that he needs to lose some weight, discussed eating smaller portions.  He states that he has been compliant with his medications and taking as ordered.  Went over fluid restriction and what he is currently taking in.  Also discussed being compliant with making it to his heart failure clinic appointments.  Also discussed being in communication with Kristi with para medicine and Otila Kluver in the heart failure clinic with changes in weight and/or symptoms.  He voices understanding.  Pricilla Riffle RN [facility]

## 2021-11-20 NOTE — Progress Notes (Signed)
[facility] Progress Note  Patient: Hector [name]    CBJ:[id number]  DOA: 11/18/2021    Date of Service: the patient was seen and examined on 11/20/2021  Brief hospital course: 56 year old male with past medical history of systolic/diastolic CHF with ejection fraction less than 20% with stage IV chronic kidney disease, atrial fibrillation and obesity who presented to the emergency room on 9/26 with shortness of breath and admitted for CHF exacerbation.  Assessment and Plan: Assessment and Plan: Acute on chronic systolic (congestive) heart failure (HCC) Continue IV Lasix.  Echocardiogram done at the beginning of August noted ejection fraction of less than 20% with indeterminate diastolic function.  Patient has diuresed almost 4 L and is more than -2.5 L deficient.  Weight is down over 3 pounds.  Continue diuresis.  Patient already clinically feeling better.  CKD (chronic kidney disease) stage 4, GFR 15-29 ml/min (HCC) Looks to be at baseline  Essential hypertension Blood pressure staying stable.  She greatly improved with the diuresis.  Paroxysmal atrial flutter (HCC) Rate controlled.  Continue Eliquis.  Hyperlipidemia Continue statin  Hypothyroidism Continue Synthroid  Obesity (BMI 30-39.9) Meets criteria with BMI greater than 30       Body mass index is 34.69 kg/m.        Consultants: None  Procedures: None  Antimicrobials: None  Code Status: Full code   Subjective: Patient feeling okay, denies any complaints  Objective: Vital signs were reviewed and unremarkable. Vitals:   11/20/21 0807 11/20/21 1200  BP: (!) 108/90 (!) 105/90  Pulse: 89 84  Resp: 18 18  Temp:  97.6 F (36.4 C)  SpO2: 98%     Intake/Output Summary (Last 24 hours) at 11/20/2021 1351 Last data filed at 11/20/2021 1100 Gross per 24 hour  Intake 960 ml  Output 3875 ml  Net -2915 ml    Filed Weights   11/18/21 1230 11/20/21 0352  Weight: 114.3 kg 112.8 kg   Body mass  index is 34.69 kg/m.  Exam:  General: Alert and oriented x3, no acute distress HEENT: Normocephalic, atraumatic, mucous membranes are moist Cardiovascular: Regular rate and rhythm, S1-S2, 2 out of 6 systolic ejection murmur Respiratory: Decreased breath sounds bibasilar Abdomen: Soft, nontender, nondistended, positive bowel sounds in Musculoskeletal: Clubbing or cyanosis, 1+ pitting edema bilaterally Skin: No skin breaks, tears or lesions Psychiatry: Appropriate, no evidence of psychoses Neurology: No focal deficits  Data Reviewed: Creatinine of 4.03 with GFR of 16.  Disposition:  Status is: Inpatient    Anticipated discharge date: 9/30  Remaining issues to be resolved so that patient can be discharged:  -Further diuresis   Family Communication: Declined for me to call family DVT Prophylaxis: SCDs Start: 11/18/21 1706 apixaban (ELIQUIS) tablet 5 mg    Author: Annita Brod ,MD 11/20/2021 1:51 PM  To reach On-call, see care teams to locate the attending and reach out via [email or web address]. Between 7PM-7AM, please contact night-coverage If you still have difficulty reaching the attending provider, please page the [facility] (Director on Call) for [facility] on amion for assistance.

## 2021-11-20 NOTE — TOC Initial Note (Signed)
Transition of Care [facility]) - Initial/Assessment Note    Patient Details  Name: Hector [name] MRN: [id number] Date of Birth: [birth year]  Transition of Care [facility]) CM/SW Contact:    Alberteen Sam, LCSW Phone Number: 11/20/2021, 3:50 PM  Clinical Narrative:                  Readmission prevention screen complete per chart review as patient had recent admission.   PCP is Rutherford Guys, MD. Brother or neighbor typically take him to appointments. He uses the [facility] or [facility] on [address]. No issues obtaining medications other than Princella Ion taking several days to get his medications.   No home health or DME use prior to admission. He does have a cane and RW at home if needed. No further concerns.   CSW will continue to follow patient for support and facilitate return home once stable. His brother will likely transport him home at discharge.             Plan: Home with self care Barriers to Discharge: Continued Medical Work up   Patient Goals and CMS Choice Patient states their goals for this hospitalization and ongoing recovery are:: to go home CMS Medicare.gov Compare Post Acute Care list provided to:: Patient Choice offered to / list presented to : Patient  Expected Discharge Plan and Services Expected Discharge Plan: [place]       Living arrangements for the past 2 months: Single Family Home                                      Prior Living Arrangements/Services Living arrangements for the past 2 months: Single Family Home                     Activities of Daily Living Home Assistive Devices/Equipment: Scales ADL Screening (condition at time of admission) Patient's cognitive ability adequate to safely complete daily activities?: Yes Is the patient deaf or have difficulty hearing?: No Does the patient have difficulty seeing, even when wearing glasses/contacts?: No Does the patient have difficulty  concentrating, remembering, or making decisions?: No Patient able to express need for assistance with ADLs?: Yes Does the patient have difficulty dressing or bathing?: No Independently performs ADLs?: Yes (appropriate for developmental age) Does the patient have difficulty walking or climbing stairs?: No Weakness of Legs: None Weakness of Arms/Hands: None  Permission Sought/Granted                  Emotional Assessment       Orientation: : Oriented to Self, Oriented to Place, Oriented to  Time, Oriented to Situation Alcohol / Substance Use: Not Applicable Psych Involvement: No (comment)  Admission diagnosis:  CHF (congestive heart failure) ([place]) [I50.9] Acute combined systolic and diastolic congestive heart failure (HCC) [I50.41] Acute on chronic congestive heart failure, unspecified heart failure type [facility]) [I50.9] Patient Active Problem List   Diagnosis Date Noted   Acute combined systolic and diastolic congestive heart failure ([place]) 11/19/2021   CHF (congestive heart failure) ([place]) 11/18/2021   Gout 11/18/2021   Atrial fibrillation with rapid ventricular response ([place]) 10/13/2021   Dyslipidemia 10/13/2021   Obesity (BMI 30-39.9) 10/01/2021   Acute on chronic systolic (congestive) heart failure ([place]) 09/28/2021   Right upper quadrant pain 09/10/2021   Nausea and vomiting 09/10/2021   Atrial flutter with rapid ventricular  response ([place]) 03/01/2021   GI bleeding 11/30/2020   Hypothyroidism 11/30/2020   Stroke ([place])    Alcohol abuse    CKD (chronic kidney disease), stage IV (HCC)    Chronic systolic CHF (congestive heart failure) ([place])    Iron deficiency anemia    Congestive heart failure (CHF) ([place]) 09/12/2020   CKD (chronic kidney disease) stage 4, GFR 15-29 ml/min ([place]) 07/21/2020   GIB (gastrointestinal bleeding) 07/21/2020   Acute blood loss anemia 07/21/2020   Hyperthyroidism without thyroid nodule 02/03/2020   Acute respiratory distress syndrome (ARDS) due to  COVID-19 virus ([place]) 12/31/2019   Acute renal failure superimposed on stage 3b chronic kidney disease ([place]) 12/31/2019   Hyperkalemia 12/31/2019   Hyponatremia 12/31/2019   Acute on chronic systolic CHF (congestive heart failure) ([place]) 07/21/2016   Obstructive sleep apnea 04/21/2016   Nonischemic cardiomyopathy ([place]) 05/26/2015   Chronic combined systolic (congestive) and diastolic (congestive) heart failure ([place]) 04/01/2015   Essential hypertension 04/01/2015   Sinus bradycardia 01/29/2015   Nonischemic cardiomyopathy: EF 20-25% 09/21/2014   Paroxysmal atrial fibrillation ([place]) 08/16/2014   Paroxysmal atrial flutter ([place]) 08/14/2014    Class: Acute   Hypertensive heart disease    Hyperlipidemia    PCP:  [name], MD Pharmacy:   [facility] [address]), [place] ([place]) [place] [address] Phone: [phone] Fax: [phone]     Social Determinants of Health (SDOH) Interventions    Readmission Risk Interventions    10/14/2021    1:24 PM 03/04/2021   10:16 AM 09/14/2020    3:06 PM  Readmission Risk Prevention Plan  Transportation Screening Complete Complete Complete  PCP or Specialist Appt within 3-5 Days Complete Complete   HRI or Media  Complete   Social Work Consult for [place] Planning/Counseling Complete Complete   Palliative Care Screening Not Applicable Not Applicable   Medication Review [employer]) Complete Complete Complete  PCP or Specialist appointment within 3-5 days of discharge   Complete  HRI or Home Care Consult   Complete  SW Recovery Care/Counseling Consult   Complete  Palliative Care Screening   Not Applicable  Skilled Nursing Facility   Complete

## 2021-11-21 DIAGNOSIS — N184 Chronic kidney disease, stage 4 (severe): Secondary | ICD-10-CM | POA: Diagnosis not present

## 2021-11-21 DIAGNOSIS — I5041 Acute combined systolic (congestive) and diastolic (congestive) heart failure: Secondary | ICD-10-CM | POA: Diagnosis not present

## 2021-11-21 DIAGNOSIS — I10 Essential (primary) hypertension: Secondary | ICD-10-CM | POA: Diagnosis not present

## 2021-11-21 LAB — BASIC METABOLIC PANEL
Anion gap: 8 (ref 5–15)
BUN: 58 mg/dL — ABNORMAL HIGH (ref 6–20)
CO2: 24 mmol/L (ref 22–32)
Calcium: 8 mg/dL — ABNORMAL LOW (ref 8.9–10.3)
Chloride: 103 mmol/L (ref 98–111)
Creatinine, Ser: 3.88 mg/dL — ABNORMAL HIGH (ref 0.61–1.24)
GFR, Estimated: 17 mL/min — ABNORMAL LOW (ref >60)
Glucose, Bld: 160 mg/dL — ABNORMAL HIGH (ref 70–99)
Potassium: 4.2 mmol/L (ref 3.5–5.1)
Sodium: 135 mmol/L (ref 135–145)

## 2021-11-21 LAB — GLUCOSE, CAPILLARY
Glucose-Capillary: 100 mg/dL — ABNORMAL HIGH (ref 70–99)
Glucose-Capillary: 126 mg/dL — ABNORMAL HIGH (ref 70–99)

## 2021-11-21 MED ORDER — INSULIN ASPART 100 UNIT/ML IJ SOLN
0.0000 [IU] | Freq: Three times a day (TID) | INTRAMUSCULAR | Status: DC
  Filled 2021-11-21: qty 1

## 2021-11-21 NOTE — Plan of Care (Signed)

## 2021-11-21 NOTE — Plan of Care (Signed)
  Problem: Education: Goal: Knowledge of General Education information will improve Description: Including pain rating scale, medication(s)/side effects and non-pharmacologic comfort measures Outcome: Adequate for Discharge   Problem: Health Behavior/Discharge Planning: Goal: Ability to manage health-related needs will improve Outcome: Adequate for Discharge   Problem: Clinical Measurements: Goal: Ability to maintain clinical measurements within normal limits will improve Outcome: Adequate for Discharge Goal: Will remain free from infection Outcome: Adequate for Discharge Goal: Diagnostic test results will improve Outcome: Adequate for Discharge Goal: Respiratory complications will improve Outcome: Adequate for Discharge Goal: Cardiovascular complication will be avoided Outcome: Adequate for Discharge   Problem: Activity: Goal: Risk for activity intolerance will decrease Outcome: Adequate for Discharge   Problem: Nutrition: Goal: Adequate nutrition will be maintained Outcome: Adequate for Discharge   Problem: Coping: Goal: Level of anxiety will decrease Outcome: Adequate for Discharge   Problem: Elimination: Goal: Will not experience complications related to bowel motility Outcome: Adequate for Discharge Goal: Will not experience complications related to urinary retention Outcome: Adequate for Discharge   Problem: Pain Managment: Goal: General experience of comfort will improve Outcome: Adequate for Discharge   Problem: Safety: Goal: Ability to remain free from injury will improve Outcome: Adequate for Discharge   Problem: Skin Integrity: Goal: Risk for impaired skin integrity will decrease Outcome: Adequate for Discharge   Problem: Education: Goal: Ability to demonstrate management of disease process will improve Outcome: Adequate for Discharge Goal: Ability to verbalize understanding of medication therapies will improve Outcome: Adequate for Discharge    Problem: Activity: Goal: Capacity to carry out activities will improve Outcome: Adequate for Discharge   Problem: Cardiac: Goal: Ability to achieve and maintain adequate cardiopulmonary perfusion will improve Outcome: Adequate for Discharge   Problem: Education: Goal: Ability to describe self-care measures that may prevent or decrease complications (Diabetes Survival Skills Education) will improve Outcome: Adequate for Discharge Goal: Individualized Educational Video(s) Outcome: Adequate for Discharge   Problem: Coping: Goal: Ability to adjust to condition or change in health will improve Outcome: Adequate for Discharge   Problem: Fluid Volume: Goal: Ability to maintain a balanced intake and output will improve Outcome: Adequate for Discharge   Problem: Health Behavior/Discharge Planning: Goal: Ability to identify and utilize available resources and services will improve Outcome: Adequate for Discharge Goal: Ability to manage health-related needs will improve Outcome: Adequate for Discharge   Problem: Metabolic: Goal: Ability to maintain appropriate glucose levels will improve Outcome: Adequate for Discharge   Problem: Skin Integrity: Goal: Risk for impaired skin integrity will decrease Outcome: Adequate for Discharge   Problem: Tissue Perfusion: Goal: Adequacy of tissue perfusion will improve Outcome: Adequate for Discharge

## 2021-11-21 NOTE — Progress Notes (Addendum)
  Progress Note   Patient: Hector [name] KAJ:[id number] DOB: [birth year] DOA: 11/18/2021     2 DOS: the patient was seen and examined on 11/21/2021   Brief hospital course: 56 year old male with past medical history of systolic/diastolic CHF with ejection fraction less than 20% with stage IV chronic kidney disease, atrial fibrillation and obesity who presented to the emergency room on 9/26 with shortness of breath and admitted for CHF exacerbation.  Assessment and Plan: Acute on chronic systolic (congestive) heart failure (HCC) Echocardiogram done at the beginning of August noted ejection fraction of less than 20% with indeterminate diastolic function.   Patient is doing better today, renal function still getting better, will continue IV furosemide for another day.  May be able to discharge home tomorrow.   CKD (chronic kidney disease) stage 4, GFR 15-29 ml/min (HCC) Renal function improving after furosemide.  Essential hypertension Continue home medicines.  Paroxysmal atrial flutter (HCC) Continue Eliquis.  Hyperlipidemia Continue statin  Hypothyroidism Continue Synthroid  Obesity (BMI 30-39.9) Meets criteria with BMI greater than 30       Subjective:  Patient feels much better today, less short of breath.  No hypoxia.  Physical Exam: Vitals:   11/20/21 1704 11/20/21 2021 11/21/21 0815 11/21/21 1127  BP: (!) 120/107 101/80 (!) 114/98 (!) 111/92  Pulse: 68 88 (!) 102 (!) 102  Resp: 18 18 18 18   Temp: 98 F (36.7 C) 98.2 F (36.8 C) 98 F (36.7 C) 98 F (36.7 C)  TempSrc:  Oral    SpO2: 95% 95% 96% 98%  Weight:      Height:       General exam: Appears calm and comfortable  Respiratory system: Decreased breathing sounds. Respiratory effort normal. Cardiovascular system: Irregular. No JVD, murmurs, rubs, gallops or clicks. No pedal edema. Gastrointestinal system: Abdomen is nondistended, soft and nontender. No organomegaly or masses felt. Normal bowel sounds  heard. Central nervous system: Alert and oriented. No focal neurological deficits. Extremities: Symmetric 5 x 5 power. Skin: No rashes, lesions or ulcers Psychiatry: Mood & affect appropriate.   Data Reviewed:  Chest x-ray and lab results reviewed.  Family Communication:   Disposition: Status is: Inpatient Remains inpatient appropriate because: Severity of disease, IV Lasix.  Planned Discharge Destination: Home    Time spent: 35 minutes  Author: Sharen Hones, MD 11/21/2021 2:33 PM  For on call review [email or web address].

## 2021-11-22 DIAGNOSIS — I5041 Acute combined systolic (congestive) and diastolic (congestive) heart failure: Secondary | ICD-10-CM | POA: Diagnosis not present

## 2021-11-22 DIAGNOSIS — N184 Chronic kidney disease, stage 4 (severe): Secondary | ICD-10-CM | POA: Diagnosis not present

## 2021-11-22 DIAGNOSIS — I10 Essential (primary) hypertension: Secondary | ICD-10-CM | POA: Diagnosis not present

## 2021-11-22 LAB — BASIC METABOLIC PANEL
Anion gap: 9 (ref 5–15)
BUN: 54 mg/dL — ABNORMAL HIGH (ref 6–20)
CO2: 24 mmol/L (ref 22–32)
Calcium: 8.6 mg/dL — ABNORMAL LOW (ref 8.9–10.3)
Chloride: 103 mmol/L (ref 98–111)
Creatinine, Ser: 3.36 mg/dL — ABNORMAL HIGH (ref 0.61–1.24)
GFR, Estimated: 21 mL/min — ABNORMAL LOW (ref >60)
Glucose, Bld: 122 mg/dL — ABNORMAL HIGH (ref 70–99)
Potassium: 4.4 mmol/L (ref 3.5–5.1)
Sodium: 136 mmol/L (ref 135–145)

## 2021-11-22 LAB — MAGNESIUM: Magnesium: 2.6 mg/dL — ABNORMAL HIGH (ref 1.7–2.4)

## 2021-11-22 LAB — GLUCOSE, CAPILLARY: Glucose-Capillary: 135 mg/dL — ABNORMAL HIGH (ref 70–99)

## 2021-11-22 NOTE — Progress Notes (Signed)
  Progress Note   Patient: Hector [name] [id number]:[id number] DOB: 1966-01-19 DOA: 11/18/2021     3 DOS: the patient was seen and examined on 11/22/2021   Brief hospital course: 56 year old male with past medical history of systolic/diastolic CHF with ejection fraction less than 20% with stage IV chronic kidney disease, atrial fibrillation and obesity who presented to the emergency room on 9/26 with shortness of breath and admitted for CHF exacerbation.  Assessment and Plan: Acute on chronic systolic (congestive) heart failure (HCC) Echocardiogram done at the beginning of August noted ejection fraction of less than 20% with indeterminate diastolic function.   Patient will benefit from additional day of IV furosemide as patient renal function still improving.     CKD (chronic kidney disease) stage 4, GFR 15-29 ml/min (HCC) Renal function is better.   Essential hypertension Continue home medicines.   Paroxysmal atrial flutter (HCC) Continue Eliquis.   Hyperlipidemia Continue statin   Hypothyroidism Continue Synthroid   Obesity (BMI 30-39.9) Meets criteria with BMI greater than 30          Subjective:  Patient doing well, slept well without paroxysmal active dyspnea or orthopnea. Short of breath better.  Physical Exam: Vitals:   11/22/21 0500 11/22/21 0755 11/22/21 1232 11/22/21 1233  BP:  (!) 108/91 (!) 171/159   Pulse:  71 (!) 26 99  Resp:  16 16   Temp:  99.1 F (37.3 C) 98.6 F (37 C)   TempSrc:      SpO2:  98% 99%   Weight: 112.1 kg     Height:       General exam: Appears calm and comfortable  Respiratory system: Decreased breathing sounds. Respiratory effort normal. Cardiovascular system: S1 & S2 heard, RRR. No JVD, murmurs, rubs, gallops or clicks. No pedal edema. Gastrointestinal system: Abdomen is nondistended, soft and nontender. No organomegaly or masses felt. Normal bowel sounds heard. Central nervous system: Alert and oriented. No focal neurological  deficits. Extremities: Symmetric 5 x 5 power. Skin: No rashes, lesions or ulcers Psychiatry: Judgement and insight appear normal. Mood & affect appropriate.   Data Reviewed:  Lab results  reviewed.  Family Communication:   Disposition: Status is: Inpatient Remains inpatient appropriate because: Severity of disease, IV treatment.  Planned Discharge Destination: Home    Time spent: 35 minutes  Author: Sharen Hones, MD 11/22/2021 2:53 PM  For on call review [email or web address].

## 2021-11-23 DIAGNOSIS — E669 Obesity, unspecified: Secondary | ICD-10-CM | POA: Diagnosis not present

## 2021-11-23 DIAGNOSIS — N184 Chronic kidney disease, stage 4 (severe): Secondary | ICD-10-CM | POA: Diagnosis not present

## 2021-11-23 DIAGNOSIS — I5041 Acute combined systolic (congestive) and diastolic (congestive) heart failure: Secondary | ICD-10-CM | POA: Diagnosis not present

## 2021-11-23 LAB — BASIC METABOLIC PANEL
Anion gap: 8 (ref 5–15)
BUN: 55 mg/dL — ABNORMAL HIGH (ref 6–20)
CO2: 25 mmol/L (ref 22–32)
Calcium: 8.5 mg/dL — ABNORMAL LOW (ref 8.9–10.3)
Chloride: 102 mmol/L (ref 98–111)
Creatinine, Ser: 3.23 mg/dL — ABNORMAL HIGH (ref 0.61–1.24)
GFR, Estimated: 21 mL/min — ABNORMAL LOW (ref >60)
Glucose, Bld: 116 mg/dL — ABNORMAL HIGH (ref 70–99)
Potassium: 4.1 mmol/L (ref 3.5–5.1)
Sodium: 135 mmol/L (ref 135–145)

## 2021-11-23 LAB — MAGNESIUM: Magnesium: 2.6 mg/dL — ABNORMAL HIGH (ref 1.7–2.4)

## 2021-11-23 NOTE — Progress Notes (Signed)
  Progress Note   Patient: Hector [name] [id number]:[id number] DOB: January 15, 1966 DOA: 11/18/2021     4 DOS: the patient was seen and examined on 11/23/2021   Brief hospital course: 56 year old male with past medical history of systolic/diastolic CHF with ejection fraction less than 20% with stage IV chronic kidney disease, atrial fibrillation and obesity who presented to the emergency room on 9/26 with shortness of breath and admitted for CHF exacerbation.  Assessment and Plan:  Acute on chronic systolic (congestive) heart failure (HCC) Echocardiogram done at the beginning of August noted ejection fraction of less than 20% with indeterminate diastolic function.   Patient is treated with IV furosemide, renal function is still improving on diuretics.  Continue IV Lasix for now.     CKD (chronic kidney disease) stage 4, GFR 15-29 ml/min (HCC) Renal function improving with diuretics.   Essential hypertension Continue home medicines.   Paroxysmal atrial flutter (HCC) Continue Eliquis.   Hyperlipidemia Continue statin   Hypothyroidism Continue Synthroid   Obesity (BMI 30-39.9) Meets criteria with BMI greater than      Subjective:  Patient doing well, short of breath essentially improved.  Physical Exam: Vitals:   11/23/21 0418 11/23/21 0500 11/23/21 0759 11/23/21 1205  BP: 107/87  100/71 103/78  Pulse: (!) 53  60 (!) 108  Resp: 16  16 18   Temp: 98.3 F (36.8 C)  98.1 F (36.7 C) 98.3 F (36.8 C)  TempSrc:      SpO2: 94%  95% 98%  Weight:  111.9 kg    Height:       General exam: Appears calm and comfortable  Respiratory system: Decreased breath sounds. Respiratory effort normal. Cardiovascular system: S1 & S2 heard, RRR. No JVD, murmurs, rubs, gallops or clicks. No pedal edema. Gastrointestinal system: Abdomen is nondistended, soft and nontender. No organomegaly or masses felt. Normal bowel sounds heard. Central nervous system: Alert and oriented. No focal neurological  deficits. Extremities: Symmetric 5 x 5 power. Skin: No rashes, lesions or ulcers Psychiatry: Judgement and insight appear normal. Mood & affect appropriate.   Data Reviewed:  Lab results reviewed.  Family Communication:   Disposition: Status is: Inpatient Remains inpatient appropriate because: Severity of disease, IV treatment.  Planned Discharge Destination: Home    Time spent: 35 minutes  Author: Sharen Hones, MD 11/23/2021 1:39 PM  For on call review [email or web address].

## 2021-11-23 NOTE — Plan of Care (Signed)
Patient knowledge improving

## 2021-11-24 DIAGNOSIS — N184 Chronic kidney disease, stage 4 (severe): Secondary | ICD-10-CM | POA: Diagnosis not present

## 2021-11-24 DIAGNOSIS — I5041 Acute combined systolic (congestive) and diastolic (congestive) heart failure: Secondary | ICD-10-CM | POA: Diagnosis not present

## 2021-11-24 DIAGNOSIS — E669 Obesity, unspecified: Secondary | ICD-10-CM | POA: Diagnosis not present

## 2021-11-24 DIAGNOSIS — I10 Essential (primary) hypertension: Secondary | ICD-10-CM | POA: Diagnosis not present

## 2021-11-24 LAB — BASIC METABOLIC PANEL
Anion gap: 10 (ref 5–15)
BUN: 54 mg/dL — ABNORMAL HIGH (ref 6–20)
CO2: 25 mmol/L (ref 22–32)
Calcium: 8.5 mg/dL — ABNORMAL LOW (ref 8.9–10.3)
Chloride: 100 mmol/L (ref 98–111)
Creatinine, Ser: 3.34 mg/dL — ABNORMAL HIGH (ref 0.61–1.24)
GFR, Estimated: 21 mL/min — ABNORMAL LOW (ref >60)
Glucose, Bld: 132 mg/dL — ABNORMAL HIGH (ref 70–99)
Potassium: 4.3 mmol/L (ref 3.5–5.1)
Sodium: 135 mmol/L (ref 135–145)

## 2021-11-24 LAB — MAGNESIUM: Magnesium: 2.6 mg/dL — ABNORMAL HIGH (ref 1.7–2.4)

## 2021-11-24 MED ORDER — TORSEMIDE 40 MG PO TABS: 40.0000 mg | ORAL_TABLET | Freq: Two times a day (BID) | ORAL | 3 refills | Status: DC

## 2021-11-24 MED ORDER — LEVOTHYROXINE SODIUM 100 MCG PO TABS: 100.0000 ug | ORAL_TABLET | Freq: Every day | ORAL | 11 refills | Status: DC

## 2021-11-24 MED ORDER — ATORVASTATIN CALCIUM 80 MG PO TABS: 80.0000 mg | ORAL_TABLET | Freq: Every day | ORAL | 11 refills | Status: DC

## 2021-11-24 MED ORDER — POTASSIUM CHLORIDE CRYS ER 20 MEQ PO TBCR: 20.0000 meq | EXTENDED_RELEASE_TABLET | Freq: Every day | ORAL | 11 refills | Status: DC

## 2021-11-24 NOTE — Progress Notes (Signed)
   Heart Failure Nurse Navigator Note  Met with patient this morning, he thinks he might be discharged home today.  Went over the importance of unpacking the scale and using it to weigh himself daily.  Parameters of weight gain to report, along with changes in symptoms.  Discussed fluid restriction of 674 ounces daily, he really is unsure of how much he drinks daily, knows that he drinks "a lot of juice."  - went over the importance of measuring and keeping record of what he is taking in.  Also stressed the importance of follow up in the heart failure clinic and with para medicine.  Pricilla Riffle RN [facility]

## 2021-11-24 NOTE — Discharge Summary (Addendum)
Physician Discharge Summary   Patient: Hector [name] MRN: [id number] DOB: 1965/05/10  Admit date:     11/18/2021  Discharge date: 12/03/21  Discharge Physician: Annita Brod   PCP: [name], MD   Recommendations at discharge:   Patient given refills for all of his prescriptions including torsemide, potassium and Synthroid  Discharge Diagnoses: Active Problems:   Acute on chronic systolic (congestive) heart failure (HCC)   CKD (chronic kidney disease) stage 4, GFR 15-29 ml/min (HCC)   Essential hypertension   Paroxysmal atrial flutter (HCC)   Hyperlipidemia   Hypothyroidism   Obesity (BMI 30-39.9)  Resolved Problems:   * No resolved hospital problems. *  Hospital Course: 56 year old male with past medical history of systolic/diastolic CHF with ejection fraction less than 20% with stage IV chronic kidney disease, atrial fibrillation and obesity who presented to the emergency room on 9/26 with shortness of breath and admitted for CHF exacerbation.  By day of discharge, patient had diuresed over 17 L and was almost -12 L deficient.  At this point, felt to have fully diuresed and back to his baseline.  Felt to be stable for discharge.  Assessment and Plan: Acute on chronic systolic (congestive) heart failure (HCC) Continue IV Lasix.  Echocardiogram done at the beginning of August noted ejection fraction of less than 20% with indeterminate diastolic function.  Patient has diuresed over 15 L and is almost -12 L deficient.  Weight is down almost 7 pounds although I suspect it is more than this.  With creatinine starting to trend back up after was trending down, feel patient is close to baseline in terms of dry weight.  CKD (chronic kidney disease) stage 4, GFR 15-29 ml/min (HCC) Looks to be at baseline  Essential hypertension Blood pressure initially elevated, but greatly improved with diuresis.  Paroxysmal atrial flutter (HCC) Rate controlled.  Continue  Eliquis.  Hyperlipidemia Continue statin  Hypothyroidism Continue Synthroid  Obesity (BMI 30-39.9) Meets criteria with BMI greater than 30         Consultants: None Procedures performed: None Disposition: Home Diet recommendation:  Heart healthy DISCHARGE MEDICATION: Allergies as of 11/24/2021       Reactions   Esomeprazole Magnesium Other (See Comments)   Suspected interstitial nephritis 2018   Eggs Or Egg-derived Products Rash        Medication List     TAKE these medications    allopurinol 100 MG tablet Commonly known as: ZYLOPRIM Take 0.5 tablets (50 mg total) by mouth daily.   apixaban 5 MG Tabs tablet Commonly known as: ELIQUIS Take 1 tablet (5 mg total) by mouth 2 (two) times daily.   atorvastatin 80 MG tablet Commonly known as: LIPITOR Take 1 tablet (80 mg total) by mouth daily.   carvedilol 6.25 MG tablet Commonly known as: COREG Take 1 tablet (6.25 mg total) by mouth 2 (two) times daily with a meal.   dapagliflozin propanediol 10 MG Tabs tablet Commonly known as: Farxiga Take 1 tablet (10 mg total) by mouth daily before breakfast.   hydrALAZINE 25 MG tablet Commonly known as: APRESOLINE Take 1 tablet (25 mg total) by mouth 3 (three) times daily.   isosorbide dinitrate 5 MG tablet Commonly known as: ISORDIL Take 1 tablet (5 mg total) by mouth 3 (three) times daily.   levothyroxine 100 MCG tablet Commonly known as: SYNTHROID Take 1 tablet (100 mcg total) by mouth daily before breakfast.   potassium chloride SA 20 MEQ tablet Commonly known as: KLOR-CON  M Take 1 tablet (20 mEq total) by mouth daily.   Torsemide 40 MG Tabs Take 40 mg by mouth 2 (two) times daily.        Discharge Exam: Filed Weights   11/22/21 0500 11/23/21 0500 11/24/21 0519  Weight: 112.1 kg 111.9 kg 111.3 kg   General: Alert and oriented x3, no acute distress Cardiovascular: Rate and rhythm, rate controlled Lungs: Clear to auscultation  bilaterally  Condition at discharge: good  The results of significant diagnostics from this hospitalization (including imaging, microbiology, ancillary and laboratory) are listed below for reference.   Imaging Studies: DG Chest 2 View  Result Date: 11/18/2021 CLINICAL DATA:  Shortness of breath, abdominal swelling and pain x2 weeks. EXAM: CHEST - 2 VIEW COMPARISON:  Chest radiograph October 13, 2021. FINDINGS: Similar cardiomegaly and central vascular congestion. Bibasilar and perihilar predominant interstitial opacities likely reflect edema. Tiny right pleural effusion. No pneumothorax. The visualized skeletal structures are unchanged. IMPRESSION: Similar cardiomegaly and central vascular congestion with probable mild interstitial edema and trace right pleural effusion. Electronically Signed   By: Dahlia Bailiff M.D.   On: 11/18/2021 13:08    Microbiology: Results for orders placed or performed during the hospital encounter of 10/13/21  Resp Panel by RT-PCR (Flu A&B, Covid) Anterior Nasal Swab     Status: None   Collection Time: 10/13/21  3:50 PM   Specimen: Anterior Nasal Swab  Result Value Ref Range Status   SARS Coronavirus 2 by RT PCR NEGATIVE NEGATIVE Final    Comment: (NOTE) SARS-CoV-2 target nucleic acids are NOT DETECTED.  The SARS-CoV-2 RNA is generally detectable in upper respiratory specimens during the acute phase of infection. The lowest concentration of SARS-CoV-2 viral copies this assay can detect is 138 copies/mL. A negative result does not preclude SARS-Cov-2 infection and should not be used as the sole basis for treatment or other patient management decisions. A negative result may occur with  improper specimen collection/handling, submission of specimen other than nasopharyngeal swab, presence of viral mutation(s) within the areas targeted by this assay, and inadequate number of viral copies(<138 copies/mL). A negative result must be combined with clinical  observations, patient history, and epidemiological information. The expected result is Negative.  Fact Sheet for Patients:  EntrepreneurPulse.com.au  Fact Sheet for Healthcare Providers:  IncredibleEmployment.be  This test is no t yet approved or cleared by the [place] FDA and  has been authorized for detection and/or diagnosis of SARS-CoV-2 by FDA under an Emergency Use Authorization (EUA). This EUA will remain  in effect (meaning this test can be used) for the duration of the COVID-19 declaration under Section 564(b)(1) of the Act, 21 U.S.C.section 360bbb-3(b)(1), unless the authorization is terminated  or revoked sooner.       Influenza A by PCR NEGATIVE NEGATIVE Final   Influenza B by PCR NEGATIVE NEGATIVE Final    Comment: (NOTE) The Xpert Xpress SARS-CoV-2/FLU/RSV plus assay is intended as an aid in the diagnosis of influenza from Nasopharyngeal swab specimens and should not be used as a sole basis for treatment. Nasal washings and aspirates are unacceptable for Xpert Xpress SARS-CoV-2/FLU/RSV testing.  Fact Sheet for Patients: EntrepreneurPulse.com.au  Fact Sheet for Healthcare Providers: IncredibleEmployment.be  This test is not yet approved or cleared by the [place] FDA and has been authorized for detection and/or diagnosis of SARS-CoV-2 by FDA under an Emergency Use Authorization (EUA). This EUA will remain in effect (meaning this test can be used) for the duration of the COVID-19 declaration  under Section 564(b)(1) of the Act, 21 U.S.C. section 360bbb-3(b)(1), unless the authorization is terminated or revoked.  Performed at [facility], [address] [place], [place] [address]     Labs: CBC: No results for input(s): "WBC", "NEUTROABS", "HGB", "HCT", "MCV", "PLT" in the last 168 hours.  Basic Metabolic Panel: No results for input(s): "NA", "K", "CL", "CO2",  "GLUCOSE", "BUN", "CREATININE", "CALCIUM", "MG", "PHOS" in the last 168 hours.  Liver Function Tests: No results for input(s): "AST", "ALT", "ALKPHOS", "BILITOT", "PROT", "ALBUMIN" in the last 168 hours.  CBG: No results for input(s): "GLUCAP" in the last 168 hours.   Discharge time spent: less than 30 minutes.  Signed: Annita Brod, MD [facility] 12/03/2021

## 2021-11-27 ENCOUNTER — Telehealth: Payer: Self-pay | Admitting: Family

## 2021-11-27 ENCOUNTER — Ambulatory Visit: Payer: Medicaid Other | Attending: Family | Admitting: Family

## 2021-11-27 ENCOUNTER — Encounter: Payer: Self-pay | Admitting: Family

## 2021-11-27 VITALS — BP 103/74 | HR 97 | Resp 20 | Ht 71.0 in | Wt 250.0 lb

## 2021-11-27 DIAGNOSIS — I252 Old myocardial infarction: Secondary | ICD-10-CM | POA: Diagnosis not present

## 2021-11-27 DIAGNOSIS — E785 Hyperlipidemia, unspecified: Secondary | ICD-10-CM | POA: Diagnosis not present

## 2021-11-27 DIAGNOSIS — I480 Paroxysmal atrial fibrillation: Secondary | ICD-10-CM | POA: Diagnosis not present

## 2021-11-27 DIAGNOSIS — I130 Hypertensive heart and chronic kidney disease with heart failure and stage 1 through stage 4 chronic kidney disease, or unspecified chronic kidney disease: Secondary | ICD-10-CM | POA: Diagnosis present

## 2021-11-27 DIAGNOSIS — N183 Chronic kidney disease, stage 3 unspecified: Secondary | ICD-10-CM | POA: Insufficient documentation

## 2021-11-27 DIAGNOSIS — Z8349 Family history of other endocrine, nutritional and metabolic diseases: Secondary | ICD-10-CM | POA: Diagnosis not present

## 2021-11-27 DIAGNOSIS — I429 Cardiomyopathy, unspecified: Secondary | ICD-10-CM | POA: Diagnosis not present

## 2021-11-27 DIAGNOSIS — G4733 Obstructive sleep apnea (adult) (pediatric): Secondary | ICD-10-CM | POA: Diagnosis not present

## 2021-11-27 DIAGNOSIS — I10 Essential (primary) hypertension: Secondary | ICD-10-CM | POA: Diagnosis not present

## 2021-11-27 DIAGNOSIS — I5022 Chronic systolic (congestive) heart failure: Secondary | ICD-10-CM | POA: Insufficient documentation

## 2021-11-27 DIAGNOSIS — Z7901 Long term (current) use of anticoagulants: Secondary | ICD-10-CM | POA: Diagnosis not present

## 2021-11-27 DIAGNOSIS — Z7984 Long term (current) use of oral hypoglycemic drugs: Secondary | ICD-10-CM | POA: Diagnosis not present

## 2021-11-27 DIAGNOSIS — F1090 Alcohol use, unspecified, uncomplicated: Secondary | ICD-10-CM | POA: Diagnosis not present

## 2021-11-27 NOTE — Telephone Encounter (Signed)
Gave patient a scale after he reports he cant find the previous one he had however patient told HF navigator that he never took it out of the box so we will not be giving the patient another scale.   Carlisia Geno, NT

## 2021-11-27 NOTE — Patient Instructions (Addendum)
Resume weighing daily and call for an overnight weight gain of 3 pounds or more or a weekly weight gain of more than 5 pounds.   If you have voicemail, please make sure your mailbox is cleaned out so that we may leave a message and please make sure to listen to any voicemails.    Go pick up the rest of your medications and call your kidney doctor to make another appointment

## 2021-11-27 NOTE — Progress Notes (Signed)
[name], male    DOB: [birth year], 56 y.o.   MRN: [id number]   Hector [name] is a 56 y/o male with a history of MI, hyperlipidemia, HTN, CKD, atrial fibrillation, alcohol use, cardiomyopathy, and CHF.   Echo report from 09/29/21 reviewed and showed an EF of <20% along with moderate LAE and moderate Hector. TEE report from 03/05/21 reviewed and showed an EF of <20% along with mild/moderate Hector but no thrombus. Echo report from 09/13/20 reviewed showed an EF of 25-30% along with moderate Hector. Echo done 12/03/16 showed an EF of 45-50% which is an improvement from previous echo which was done on 06/02/16 and showed an EF of 30-35%. Prior echo on 02/21/15 which showed a normalization of his EF which was 50-55% with mild Hector. No longer ICD candidate. Prior echo on 03/07/14 showed an EF of 20-25% with mild/mod Hector.   Last left heart cath was done 01/09/15.   Admitted 11/18/21 due to acute on chronic heart failure. Initially given IV lasix with transition to oral diuretics with diuresis of > 17L. Discharged after 6 days. Admitted twice in August. Was in the ED 07/22/21 due to acute on chronic HF. Given IV lasix and home diuretic increased. and he was released.   He presents today for a follow-up visit with a chief complaint of minimal fatigue upon moderate exertion. Describes this as chronic in nature. He has associated cough, shortness of breath and chronic pain along with this. He denies any dizziness, difficulty sleeping, abdominal distention, palpitations, pedal edema, chest pain or weight gain.   Says that he feels much better since his recent admission. He says that he has 4 medications from discharge that he needs to still pickup and he says that he is getting them after leaving here today.   Not weighing daily as he says that his scales are broke and only weighs in metric and he doesn't understand it.   Past Medical History:  Diagnosis Date   Alcohol abuse    Alcoholic cardiomyopathy  ([place])    a. 12/2007 MV: EF 28%, no isch;  b. 8/12 Echo: EF 25-35%; c. 02/2014 Echo: EF 20-25%; d. 12/2014 Cath: minimal CAD; e. 01/2015 Echo: EF 50-55%;  d. 05/2016 Echo: EF 30-35%, diff HK, gr2 DD; e. 11/2016 Echo: EF 45-50%, diff HK; f. 09/2021 Echo: EF <20%.   Chronic combined systolic (congestive) and diastolic (congestive) heart failure ([place])    a. 05/2016 Echo: EF 30-35%, diff HK, Gr2 DD, mild Hector, mod dil LA; b. 11/2016 Echo: EF 45-50%, diff HK; c. 09/2021 Echo: EF < 20%, no rwma, mildly reduced RV fxn, mod dil LA, mildly dil RA, mod Hector, AoV sclerosis.   CKD (chronic kidney disease), stage III (HCC)    Elevated troponin (chronic)    Essential hypertension    GI bleed 11/2013   Hyperlipidemia    Pancreatitis    Paroxysmal A-fib (HCC)    a. new onset s/p unsuccessful TEE/DCCV on 08/16/2014; b. CHA2DS2VASc = 3-> eliquis (freq noncompliant); c. 02/2021 s/p TEE/DCCV; d. Prev on amio->d/c 2/2 abnl TFTs.   Paroxysmal atrial flutter (HCC)    Sleep-disordered breathing    Has yet to have a sleep study   Stroke [facility])    Past Surgical History:  Procedure Laterality Date   CARDIAC CATHETERIZATION N/A 01/09/2015   Procedure: Left Heart Cath and Coronary Angiography;  Surgeon: [name], MD;  Location: [place] [facility];  Service: Cardiovascular;  Laterality: N/A;  CARDIOVERSION N/A 03/05/2021   Procedure: CARDIOVERSION;  Surgeon: Kate Sable, MD;  Location: [facility] ORS;  Service: Cardiovascular;  Laterality: N/A;   COLONOSCOPY N/A 07/22/2020   Procedure: COLONOSCOPY;  Surgeon: Toledo, Benay Pike, MD;  Location: [facility] ENDOSCOPY;  Service: Gastroenterology;  Laterality: N/A;   ELECTROPHYSIOLOGIC STUDY N/A 08/16/2014   Procedure: CARDIOVERSION;  Surgeon: [name], MD;  Location: [facility] ORS;  Service: Cardiovascular;  Laterality: N/A;   FLEXIBLE SIGMOIDOSCOPY N/A 10/10/2015   Procedure: FLEXIBLE SIGMOIDOSCOPY;  Surgeon: [name], MD;  Location: [facility] ENDOSCOPY;  Service: Endoscopy;   Laterality: N/A;   KNEE SURGERY Right    NM MYOVIEW LTD  November 2011   No ischemia or infarction. EF 50-55% ( no improvement from 2009 Myoview EF of 28%   TEE WITHOUT CARDIOVERSION N/A 08/16/2014   Procedure: TRANSESOPHAGEAL ECHOCARDIOGRAM (TEE);  Surgeon: [name], MD;  Location: [facility] ORS;  Service: Cardiovascular;  Laterality: N/A;   TEE WITHOUT CARDIOVERSION N/A 03/05/2021   Procedure: TRANSESOPHAGEAL ECHOCARDIOGRAM (TEE);  Surgeon: Kate Sable, MD;  Location: [facility] ORS;  Service: Cardiovascular;  Laterality: N/A;   Family History  Problem Relation Age of Onset   Hypertension Mother    Hyperlipidemia Mother    Diabetes Mother    Social History   Tobacco Use   Smoking status: Former    Packs/day: 1.00    Years: 12.00    Total pack years: 12.00    Types: Cigarettes    Quit date: 02/23/1998    Years since quitting: 23.7   Smokeless tobacco: Never  Substance Use Topics   Alcohol use: Not Currently    Alcohol/week: 0.0 standard drinks of alcohol    Comment: Past heavy drinker   Allergies  Allergen Reactions   Esomeprazole Magnesium Other (See Comments)    Suspected interstitial nephritis 2018   Eggs Or Egg-Derived Products Rash   Prior to Admission medications   Medication Sig Start Date End Date Taking? Authorizing Provider  allopurinol (ZYLOPRIM) 100 MG tablet Take 1 tablet by mouth daily. Taking a 1/2 tablet 02/14/21 02/14/22 Yes [provider]  apixaban (ELIQUIS) 5 MG TABS tablet Take 1 tablet (5 mg total) by mouth 2 (two) times daily. 05/13/21  Yes [name], MD  atorvastatin (LIPITOR) 80 MG tablet Take 1 tablet (80 mg total) by mouth daily. 05/13/21  Yes Gollan, Kathlene November, MD  carvedilol (COREG) 12.5 MG tablet Take 12.5 mg by mouth 2 (two) times daily with a meal.   Yes [provider]  dapagliflozin propanediol (FARXIGA) 10 MG TABS tablet Take 1 tablet (10 mg total) by mouth daily before breakfast. 05/13/21  Yes Gollan, Kathlene November, MD   ferrous gluconate (FERGON) 324 MG tablet Take 324 mg by mouth daily with breakfast.   Yes [provider]  hydrALAZINE (APRESOLINE) 25 MG tablet Take 1 tablet (25 mg total) by mouth 3 (three) times daily. 05/13/21  Yes Gollan, Kathlene November, MD  levothyroxine (SYNTHROID) 100 MCG tablet Take 100 mcg by mouth daily before breakfast.   Yes [provider]  potassium chloride SA (KLOR-CON M) 20 MEQ tablet Take 1 tablet (20 mEq total) by mouth daily. 07/22/21 08/21/21 Yes Carrie Mew, MD  torsemide (DEMADEX) 20 MG tablet Take 1 tablet (20 mg total) by mouth daily. 07/29/21  Yes Alisa Graff, FNP   Review of Systems  Constitutional:  Positive for fatigue.  HENT:  Negative for congestion, postnasal drip and sore throat.   Eyes: Negative.   Respiratory:  Positive for cough and shortness of breath (with moderate exertion). Negative for chest tightness.   Cardiovascular:  Negative for chest pain, palpitations and leg swelling.  Gastrointestinal:  Positive for nausea (when starting to eat). Negative for abdominal distention, abdominal pain, constipation and diarrhea.  Endocrine: Negative.   Genitourinary: Negative.   Musculoskeletal:  Positive for arthralgias (left hand) and back pain. Negative for neck pain.  Skin: Negative.   Allergic/Immunologic: Negative.   Neurological:  Negative for dizziness, weakness and light-headedness.  Hematological:  Negative for adenopathy. Does not bruise/bleed easily.  Psychiatric/Behavioral:  Negative for dysphoric mood and sleep disturbance (sleeping on 1 pillow). The patient is not nervous/anxious.    Vitals:   11/27/21 1423  BP: 103/74  Pulse: 97  Resp: 20  SpO2: 100%  Weight: 250 lb (113.4 kg)  Height: 5\' 11"  (1.803 m)   Wt Readings from Last 3 Encounters:  11/27/21 250 lb (113.4 kg)  11/24/21 245 lb 4.8 oz (111.3 kg)  10/20/21 239 lb 3.2 oz (108.5 kg)   Lab Results  Component Value Date   CREATININE 3.34 (H) 11/24/2021    CREATININE 3.23 (H) 11/23/2021   CREATININE 3.36 (H) 11/22/2021   Physical Exam Vitals and nursing note reviewed.  Constitutional:      General: He is not in acute distress.    Appearance: Normal appearance.  HENT:     Head: Normocephalic and atraumatic.  Neck:     Vascular: No JVD.  Cardiovascular:     Rate and Rhythm: Normal rate and regular rhythm.  Pulmonary:     Effort: Pulmonary effort is normal. No respiratory distress.     Breath sounds: No wheezing or rales.  Abdominal:     General: There is no distension.     Palpations: Abdomen is soft.     Tenderness: There is no abdominal tenderness.  Musculoskeletal:     Cervical back: Normal range of motion and neck supple.     Right lower leg: No edema.     Left lower leg: No edema.  Skin:    General: Skin is warm and dry.  Neurological:     General: No focal deficit present.     Mental Status: He is alert and oriented to person, place, and time.     Motor: Weakness present.  Psychiatric:        Mood and Affect: Mood normal.        Behavior: Behavior normal.        Thought Content: Thought content normal.     Assessment & Plan:  1: Chronic heart failure with reduced ejection fraction- - NYHA Class II - euvolemic today - not weighing daily as he says that he needs scales because his only weighs in metric; scales provided today but this is his 2nd set and will not be able to provide scales again - weight down 17 pounds from last visit here 3 months ago - on GDMT of farxiga & carvedilol  - has 4 medications from discharge that he has to pick up later today - renal function will limit other GDMT - not adding salt to his food - saw cardiology Rockey Situ) 05/13/21 - saw EP Quentin Ore) 04/30/21 - BNP 11/18/21 was 2089.8  2: HTN- - BP looked great today (103/74) - follows with PCP at [facility] from 11/24/21 reviewed and showed sodium 135, potassium 4.3, creatinine 3.34 and GFR 21 - saw nephrology  ([place]) 06/11/21; he says that he has  to call and get f/u appointment scheduled.   3: Obstructive sleep apnea- - sleeping well at night  - says that he's not wearing his CPAP and needs to "get back to using it"; explained the correlation between sleep apnea and HTN/HF - encouraged to wear his CPAP every night and any time he's napping during the day   Medication bottles reviewed.   Return in 6 weeks, sooner if needed.

## 2021-12-07 ENCOUNTER — Encounter: Payer: Self-pay | Admitting: Intensive Care

## 2021-12-07 ENCOUNTER — Other Ambulatory Visit: Payer: Self-pay

## 2021-12-07 ENCOUNTER — Inpatient Hospital Stay: Payer: Medicaid Other

## 2021-12-07 ENCOUNTER — Inpatient Hospital Stay
Admission: EM | Admit: 2021-12-07 | Discharge: 2021-12-12 | DRG: 291 | Disposition: A | Discharge status: Home / Self Care | Payer: Medicaid Other | Attending: Internal Medicine | Admitting: Internal Medicine

## 2021-12-07 ENCOUNTER — Emergency Department: Payer: Medicaid Other

## 2021-12-07 DIAGNOSIS — G4733 Obstructive sleep apnea (adult) (pediatric): Secondary | ICD-10-CM | POA: Diagnosis present

## 2021-12-07 DIAGNOSIS — E119 Type 2 diabetes mellitus without complications: Secondary | ICD-10-CM

## 2021-12-07 DIAGNOSIS — E1122 Type 2 diabetes mellitus with diabetic chronic kidney disease: Secondary | ICD-10-CM | POA: Diagnosis not present

## 2021-12-07 DIAGNOSIS — D631 Anemia in chronic kidney disease: Secondary | ICD-10-CM | POA: Diagnosis present

## 2021-12-07 DIAGNOSIS — I4892 Unspecified atrial flutter: Secondary | ICD-10-CM | POA: Diagnosis present

## 2021-12-07 DIAGNOSIS — E039 Hypothyroidism, unspecified: Secondary | ICD-10-CM | POA: Diagnosis not present

## 2021-12-07 DIAGNOSIS — I2510 Atherosclerotic heart disease of native coronary artery without angina pectoris: Secondary | ICD-10-CM | POA: Diagnosis present

## 2021-12-07 DIAGNOSIS — I4891 Unspecified atrial fibrillation: Secondary | ICD-10-CM | POA: Diagnosis not present

## 2021-12-07 DIAGNOSIS — Z8249 Family history of ischemic heart disease and other diseases of the circulatory system: Secondary | ICD-10-CM | POA: Diagnosis not present

## 2021-12-07 DIAGNOSIS — I10 Essential (primary) hypertension: Secondary | ICD-10-CM | POA: Diagnosis present

## 2021-12-07 DIAGNOSIS — R911 Solitary pulmonary nodule: Secondary | ICD-10-CM

## 2021-12-07 DIAGNOSIS — R0989 Other specified symptoms and signs involving the circulatory and respiratory systems: Secondary | ICD-10-CM | POA: Diagnosis present

## 2021-12-07 DIAGNOSIS — M109 Gout, unspecified: Secondary | ICD-10-CM | POA: Diagnosis present

## 2021-12-07 DIAGNOSIS — I5023 Acute on chronic systolic (congestive) heart failure: Secondary | ICD-10-CM | POA: Diagnosis present

## 2021-12-07 DIAGNOSIS — I5021 Acute systolic (congestive) heart failure: Secondary | ICD-10-CM | POA: Diagnosis not present

## 2021-12-07 DIAGNOSIS — I2489 Other forms of acute ischemic heart disease: Secondary | ICD-10-CM | POA: Diagnosis present

## 2021-12-07 DIAGNOSIS — Z66 Do not resuscitate: Secondary | ICD-10-CM | POA: Diagnosis present

## 2021-12-07 DIAGNOSIS — Z87891 Personal history of nicotine dependence: Secondary | ICD-10-CM

## 2021-12-07 DIAGNOSIS — Z79899 Other long term (current) drug therapy: Secondary | ICD-10-CM | POA: Diagnosis not present

## 2021-12-07 DIAGNOSIS — E669 Obesity, unspecified: Secondary | ICD-10-CM | POA: Diagnosis not present

## 2021-12-07 DIAGNOSIS — Z888 Allergy status to other drugs, medicaments and biological substances status: Secondary | ICD-10-CM

## 2021-12-07 DIAGNOSIS — M1000 Idiopathic gout, unspecified site: Secondary | ICD-10-CM

## 2021-12-07 DIAGNOSIS — I428 Other cardiomyopathies: Secondary | ICD-10-CM | POA: Diagnosis present

## 2021-12-07 DIAGNOSIS — I4819 Other persistent atrial fibrillation: Secondary | ICD-10-CM | POA: Diagnosis not present

## 2021-12-07 DIAGNOSIS — Z7901 Long term (current) use of anticoagulants: Secondary | ICD-10-CM

## 2021-12-07 DIAGNOSIS — I130 Hypertensive heart and chronic kidney disease with heart failure and stage 1 through stage 4 chronic kidney disease, or unspecified chronic kidney disease: Secondary | ICD-10-CM | POA: Diagnosis present

## 2021-12-07 DIAGNOSIS — N184 Chronic kidney disease, stage 4 (severe): Secondary | ICD-10-CM | POA: Diagnosis present

## 2021-12-07 DIAGNOSIS — I509 Heart failure, unspecified: Secondary | ICD-10-CM | POA: Diagnosis present

## 2021-12-07 DIAGNOSIS — I340 Nonrheumatic mitral (valve) insufficiency: Secondary | ICD-10-CM | POA: Diagnosis present

## 2021-12-07 DIAGNOSIS — Z6835 Body mass index (BMI) 35.0-35.9, adult: Secondary | ICD-10-CM | POA: Diagnosis not present

## 2021-12-07 DIAGNOSIS — D509 Iron deficiency anemia, unspecified: Secondary | ICD-10-CM | POA: Diagnosis not present

## 2021-12-07 DIAGNOSIS — R7303 Prediabetes: Secondary | ICD-10-CM

## 2021-12-07 DIAGNOSIS — Z83438 Family history of other disorder of lipoprotein metabolism and other lipidemia: Secondary | ICD-10-CM

## 2021-12-07 DIAGNOSIS — E782 Mixed hyperlipidemia: Secondary | ICD-10-CM | POA: Diagnosis present

## 2021-12-07 DIAGNOSIS — Z8673 Personal history of transient ischemic attack (TIA), and cerebral infarction without residual deficits: Secondary | ICD-10-CM | POA: Diagnosis not present

## 2021-12-07 DIAGNOSIS — Z833 Family history of diabetes mellitus: Secondary | ICD-10-CM

## 2021-12-07 DIAGNOSIS — I5043 Acute on chronic combined systolic (congestive) and diastolic (congestive) heart failure: Secondary | ICD-10-CM | POA: Diagnosis present

## 2021-12-07 DIAGNOSIS — N2889 Other specified disorders of kidney and ureter: Secondary | ICD-10-CM | POA: Diagnosis present

## 2021-12-07 DIAGNOSIS — Z91012 Allergy to eggs: Secondary | ICD-10-CM

## 2021-12-07 LAB — COMPREHENSIVE METABOLIC PANEL
ALT: 15 U/L (ref 0–44)
AST: 15 U/L (ref 15–41)
Albumin: 3.6 g/dL (ref 3.5–5.0)
Alkaline Phosphatase: 72 U/L (ref 38–126)
Anion gap: 6 (ref 5–15)
BUN: 49 mg/dL — ABNORMAL HIGH (ref 6–20)
CO2: 25 mmol/L (ref 22–32)
Calcium: 8.6 mg/dL — ABNORMAL LOW (ref 8.9–10.3)
Chloride: 108 mmol/L (ref 98–111)
Creatinine, Ser: 3.36 mg/dL — ABNORMAL HIGH (ref 0.61–1.24)
GFR, Estimated: 21 mL/min — ABNORMAL LOW (ref >60)
Glucose, Bld: 116 mg/dL — ABNORMAL HIGH (ref 70–99)
Potassium: 4.1 mmol/L (ref 3.5–5.1)
Sodium: 139 mmol/L (ref 135–145)
Total Bilirubin: 1.9 mg/dL — ABNORMAL HIGH (ref 0.3–1.2)
Total Protein: 6.9 g/dL (ref 6.5–8.1)

## 2021-12-07 LAB — CBC
HCT: 44.6 % (ref 39.0–52.0)
Hemoglobin: 13.6 g/dL (ref 13.0–17.0)
MCH: 24.6 pg — ABNORMAL LOW (ref 26.0–34.0)
MCHC: 30.5 g/dL (ref 30.0–36.0)
MCV: 80.8 fL (ref 80.0–100.0)
Platelets: 245 10*3/uL (ref 150–400)
RBC: 5.52 MIL/uL (ref 4.22–5.81)
RDW: 22.5 % — ABNORMAL HIGH (ref 11.5–15.5)
WBC: 7.6 10*3/uL (ref 4.0–10.5)
nRBC: 0 % (ref 0.0–0.2)

## 2021-12-07 LAB — URINALYSIS, ROUTINE W REFLEX MICROSCOPIC
Bilirubin Urine: NEGATIVE
Glucose, UA: 150 mg/dL — AB
Ketones, ur: NEGATIVE mg/dL
Nitrite: NEGATIVE
Protein, ur: 100 mg/dL — AB
Specific Gravity, Urine: 1.008 (ref 1.005–1.030)
pH: 7 (ref 5.0–8.0)

## 2021-12-07 LAB — CBG MONITORING, ED: Glucose-Capillary: 163 mg/dL — ABNORMAL HIGH (ref 70–99)

## 2021-12-07 LAB — LIPASE, BLOOD: Lipase: 24 U/L (ref 11–51)

## 2021-12-07 LAB — BRAIN NATRIURETIC PEPTIDE: B Natriuretic Peptide: 1874.7 pg/mL — ABNORMAL HIGH (ref 0.0–100.0)

## 2021-12-07 MED ORDER — IPRATROPIUM-ALBUTEROL 0.5-2.5 (3) MG/3ML IN SOLN: 3.0000 mL | Freq: Once | RESPIRATORY_TRACT | Status: DC

## 2021-12-07 MED ORDER — LEVOTHYROXINE SODIUM 100 MCG PO TABS
100.0000 ug | ORAL_TABLET | Freq: Every day | ORAL | Status: DC
  Administered 2021-12-08 – 2021-12-12 (×5): 100 ug via ORAL
  Filled 2021-12-07 (×3): qty 1
  Filled 2021-12-07: qty 2
  Filled 2021-12-07: qty 1

## 2021-12-07 MED ORDER — FUROSEMIDE 10 MG/ML IJ SOLN
40.0000 mg | Freq: Two times a day (BID) | INTRAMUSCULAR | Status: DC
  Administered 2021-12-07 – 2021-12-12 (×10): 40 mg via INTRAVENOUS
  Filled 2021-12-07 (×10): qty 4

## 2021-12-07 MED ORDER — FUROSEMIDE 10 MG/ML IJ SOLN
60.0000 mg | Freq: Once | INTRAMUSCULAR | Status: AC
  Administered 2021-12-07: 60 mg via INTRAVENOUS
  Filled 2021-12-07: qty 8

## 2021-12-07 MED ORDER — CARVEDILOL 6.25 MG PO TABS
6.2500 mg | ORAL_TABLET | Freq: Two times a day (BID) | ORAL | Status: DC
  Administered 2021-12-08 – 2021-12-12 (×9): 6.25 mg via ORAL
  Filled 2021-12-07 (×9): qty 1

## 2021-12-07 MED ORDER — INSULIN ASPART 100 UNIT/ML IJ SOLN
0.0000 [IU] | Freq: Three times a day (TID) | INTRAMUSCULAR | Status: DC
  Filled 2021-12-07 (×3): qty 1

## 2021-12-07 MED ORDER — APIXABAN 5 MG PO TABS
5.0000 mg | ORAL_TABLET | Freq: Two times a day (BID) | ORAL | Status: DC
  Administered 2021-12-07 – 2021-12-12 (×10): 5 mg via ORAL
  Filled 2021-12-07 (×10): qty 1

## 2021-12-07 MED ORDER — ALLOPURINOL 100 MG PO TABS
50.0000 mg | ORAL_TABLET | Freq: Every day | ORAL | Status: DC
  Administered 2021-12-08 – 2021-12-12 (×5): 50 mg via ORAL
  Filled 2021-12-07 (×2): qty 1
  Filled 2021-12-07: qty 0.5
  Filled 2021-12-07 (×2): qty 1

## 2021-12-07 MED ORDER — FERROUS SULFATE 325 (65 FE) MG PO TABS
325.0000 mg | ORAL_TABLET | Freq: Every day | ORAL | Status: DC
  Administered 2021-12-08 – 2021-12-12 (×5): 325 mg via ORAL
  Filled 2021-12-07 (×5): qty 1

## 2021-12-07 MED ORDER — ATORVASTATIN CALCIUM 80 MG PO TABS
80.0000 mg | ORAL_TABLET | Freq: Every day | ORAL | Status: DC
  Administered 2021-12-07 – 2021-12-12 (×6): 80 mg via ORAL
  Filled 2021-12-07 (×3): qty 1
  Filled 2021-12-07: qty 4
  Filled 2021-12-07: qty 1
  Filled 2021-12-07 (×2): qty 4

## 2021-12-07 NOTE — H&P (Signed)
History and Physical    Patient: Hector [name] SWF:[id number] DOB: 27-Mar-1965 DOA: 12/07/2021 DOS: the patient was seen and examined on 12/07/2021 PCP: [name], MD  Patient coming from: Home  Chief Complaint:  Chief Complaint  Patient presents with   Joint Swelling   Abdominal Pain   HPI: Hector [name] is a 56 y.o. male with medical history significant of hypertension, hyperlipidemia, combined systolic/diastolic CHF, hypothyroidism, atrial fibrillation on Eliquis, CKD stage IV and obesity who presents to the emergency department due to increased shortness of breath, increased leg swelling and abdominal distention which started 4 days ago.  Patient states that he has been compliant with his medications, however, it is unknown if patient has been compliant with diet. Patient was recently admitted from 9/26 of 10/2 due to acute on chronic systolic CHF which was managed with IV Lasix with greater than 15 L of fluid diuresed and about 7 pounds weight loss with weight being closer to patient's dry weight prior to discharge time.  He denies chest pain, fever, chills, headache, nausea, vomiting, abdominal pain.  ED Course:  In the ED Emergency Department, patient was hemodynamically stable.  Work-up in the ED showed normal CBC, normal BMP except for blood glucose of 116 and BUN/creatinine 49/3.36 (creatinine is within baseline range).  BNP 1,874.7 (this was 2089.8 on 11/18/2021).  Lipase 24. Chest x-ray showed enlarged cardiopericardial silhouette with pulmonary vascular congestion.  Small nodular density at the right base. Patient was treated with IV Lasix 60 mg.  Hospitalist was asked to admit patient for further evaluation and management.  Review of Systems: Review of systems as noted in the HPI. All other systems reviewed and are negative.   Past Medical History:  Diagnosis Date   Alcohol abuse    Alcoholic cardiomyopathy ([place])    a. 12/2007 MV: EF 28%, no isch;  b.  8/12 Echo: EF 25-35%; c. 02/2014 Echo: EF 20-25%; d. 12/2014 Cath: minimal CAD; e. 01/2015 Echo: EF 50-55%;  d. 05/2016 Echo: EF 30-35%, diff HK, gr2 DD; e. 11/2016 Echo: EF 45-50%, diff HK; f. 09/2021 Echo: EF <20%.   Chronic combined systolic (congestive) and diastolic (congestive) heart failure ([place])    a. 05/2016 Echo: EF 30-35%, diff HK, Gr2 DD, mild MR, mod dil LA; b. 11/2016 Echo: EF 45-50%, diff HK; c. 09/2021 Echo: EF < 20%, no rwma, mildly reduced RV fxn, mod dil LA, mildly dil RA, mod MR, AoV sclerosis.   CKD (chronic kidney disease), stage III (HCC)    Elevated troponin (chronic)    Essential hypertension    GI bleed 11/2013   Hyperlipidemia    Pancreatitis    Paroxysmal A-fib (HCC)    a. new onset s/p unsuccessful TEE/DCCV on 08/16/2014; b. CHA2DS2VASc = 3-> eliquis (freq noncompliant); c. 02/2021 s/p TEE/DCCV; d. Prev on amio->d/c 2/2 abnl TFTs.   Paroxysmal atrial flutter (HCC)    Sleep-disordered breathing    Has yet to have a sleep study   Stroke [facility])    Past Surgical History:  Procedure Laterality Date   CARDIAC CATHETERIZATION N/A 01/09/2015   Procedure: Left Heart Cath and Coronary Angiography;  Surgeon: [name], MD;  Location: [place] [facility] LAB;  Service: Cardiovascular;  Laterality: N/A;   CARDIOVERSION N/A 03/05/2021   Procedure: CARDIOVERSION;  Surgeon: Kate Sable, MD;  Location: [facility] ORS;  Service: Cardiovascular;  Laterality: N/A;   COLONOSCOPY N/A 07/22/2020   Procedure: COLONOSCOPY;  Surgeon: Alice Reichert, Benay Pike, MD;  Location: [facility]  ENDOSCOPY;  Service: Gastroenterology;  Laterality: N/A;   ELECTROPHYSIOLOGIC STUDY N/A 08/16/2014   Procedure: CARDIOVERSION;  Surgeon: [name], MD;  Location: [facility] ORS;  Service: Cardiovascular;  Laterality: N/A;   FLEXIBLE SIGMOIDOSCOPY N/A 10/10/2015   Procedure: FLEXIBLE SIGMOIDOSCOPY;  Surgeon: [name], MD;  Location: [facility] ENDOSCOPY;  Service: Endoscopy;  Laterality: N/A;   KNEE SURGERY Right    NM  MYOVIEW LTD  November 2011   No ischemia or infarction. EF 50-55% ( no improvement from 2009 Myoview EF of 28%   TEE WITHOUT CARDIOVERSION N/A 08/16/2014   Procedure: TRANSESOPHAGEAL ECHOCARDIOGRAM (TEE);  Surgeon: [name], MD;  Location: [facility] ORS;  Service: Cardiovascular;  Laterality: N/A;   TEE WITHOUT CARDIOVERSION N/A 03/05/2021   Procedure: TRANSESOPHAGEAL ECHOCARDIOGRAM (TEE);  Surgeon: Kate Sable, MD;  Location: [facility] ORS;  Service: Cardiovascular;  Laterality: N/A;    Social History:  reports that he quit smoking about 23 years ago. His smoking use included cigarettes. He has a 12.00 pack-year smoking history. He has never used smokeless tobacco. He reports that he does not currently use alcohol. He reports that he does not use drugs.   Allergies  Allergen Reactions   Esomeprazole Magnesium Other (See Comments)    Suspected interstitial nephritis 2018   Eggs Or Egg-Derived Products Rash    Family History  Problem Relation Age of Onset   Hypertension Mother    Hyperlipidemia Mother    Diabetes Mother      Prior to Admission medications   Medication Sig Start Date End Date Taking? Authorizing Provider  allopurinol (ZYLOPRIM) 100 MG tablet Take 0.5 tablets (50 mg total) by mouth daily. 10/03/21   Danford, Suann Larry, MD  apixaban (ELIQUIS) 5 MG TABS tablet Take 1 tablet (5 mg total) by mouth 2 (two) times daily. 10/02/21   Danford, Suann Larry, MD  atorvastatin (LIPITOR) 80 MG tablet Take 1 tablet (80 mg total) by mouth daily. Patient not taking: Reported on 11/27/2021 11/24/21   Annita Brod, MD  carvedilol (COREG) 6.25 MG tablet Take 1 tablet (6.25 mg total) by mouth 2 (two) times daily with a meal. 10/02/21   Danford, Suann Larry, MD  dapagliflozin propanediol (FARXIGA) 10 MG TABS tablet Take 1 tablet (10 mg total) by mouth daily before breakfast. 10/02/21   Danford, Suann Larry, MD  hydrALAZINE (APRESOLINE) 25 MG tablet Take 1 tablet (25 mg total) by  mouth 3 (three) times daily. 10/02/21   Danford, Suann Larry, MD  isosorbide dinitrate (ISORDIL) 5 MG tablet Take 1 tablet (5 mg total) by mouth 3 (three) times daily. Patient not taking: Reported on 11/27/2021 10/20/21 11/19/21  Wyvonnia Dusky, MD  levothyroxine (SYNTHROID) 100 MCG tablet Take 1 tablet (100 mcg total) by mouth daily before breakfast. Patient not taking: Reported on 11/27/2021 11/24/21   Annita Brod, MD  potassium chloride SA (KLOR-CON M) 20 MEQ tablet Take 1 tablet (20 mEq total) by mouth daily. Patient not taking: Reported on 11/27/2021 11/24/21   Annita Brod, MD  Torsemide 40 MG TABS Take 40 mg by mouth 2 (two) times daily. Patient taking differently: Take 40 mg by mouth daily. 11/24/21 12/24/21  Annita Brod, MD    Physical Exam: BP (!) 121/102 (BP Location: Right Arm)   Pulse 85   Temp 98.7 F (37.1 C) (Oral)   Resp 18   Ht 5\' 11"  (1.803 m)   Wt 116.6 kg   SpO2 98%   BMI 35.84 kg/m  General: 56 y.o. year-old male well developed well nourished in no acute distress.  Alert and oriented x3. HEENT: NCAT, EOMI Neck: Supple, trachea medial Cardiovascular: Regular rate and rhythm with no rubs or gallops.  No thyromegaly or JVD noted.  No lower extremity edema. 2/4 pulses in all 4 extremities. Respiratory: Clear to auscultation with no wheezes or rales. Good inspiratory effort. Abdomen: Soft, nontender nondistended with normal bowel sounds x4 quadrants. Muskuloskeletal: No cyanosis, clubbing or edema noted bilaterally Neuro: CN II-XII intact, strength 5/5 x 4, sensation, reflexes intact Skin: No ulcerative lesions noted or rashes Psychiatry: Judgement and insight appear normal. Mood is appropriate for condition and setting          Labs on Admission:  Basic Metabolic Panel: Recent Labs  Lab 12/07/21 1411  NA 139  K 4.1  CL 108  CO2 25  GLUCOSE 116*  BUN 49*  CREATININE 3.36*  CALCIUM 8.6*   Liver Function Tests: Recent Labs  Lab  12/07/21 1411  AST 15  ALT 15  ALKPHOS 72  BILITOT 1.9*  PROT 6.9  ALBUMIN 3.6   Recent Labs  Lab 12/07/21 1411  LIPASE 24   No results for input(s): "AMMONIA" in the last 168 hours. CBC: Recent Labs  Lab 12/07/21 1411  WBC 7.6  HGB 13.6  HCT 44.6  MCV 80.8  PLT 245   Cardiac Enzymes: No results for input(s): "CKTOTAL", "CKMB", "CKMBINDEX", "TROPONINI" in the last 168 hours.  BNP (last 3 results) Recent Labs    10/13/21 1550 11/18/21 1235 12/07/21 1411  BNP 2,587.7* 2,089.8* 1,874.7*    ProBNP (last 3 results) No results for input(s): "PROBNP" in the last 8760 hours.  CBG: No results for input(s): "GLUCAP" in the last 168 hours.  Radiological Exams on Admission: DG Chest 2 View  Result Date: 12/07/2021 CLINICAL DATA:  Heart failure. EXAM: CHEST - 2 VIEW COMPARISON:  11/18/2021 FINDINGS: 1713 hours. The cardio pericardial silhouette is enlarged. There is pulmonary vascular congestion without overt pulmonary edema. Small nodular density seen lateral right costophrenic sulcus. No substantial pleural effusion. IMPRESSION: Enlarged cardiopericardial silhouette with pulmonary vascular congestion. Small nodular density at the right base. CT chest without contrast recommended to further evaluate. Electronically Signed   By: Misty Stanley M.D.   On: 12/07/2021 17:27    EKG: I independently viewed the EKG done and my findings are as followed: A-fib with RVR with PVCs  Assessment/Plan Present on Admission:  Acute on chronic systolic (congestive) heart failure (HCC)  Atrial fibrillation with rapid ventricular response (HCC)  CKD (chronic kidney disease), stage IV (HCC)  Essential hypertension  Iron deficiency anemia  Gout  Obesity (BMI 30-39.9)  Principal Problem:   Acute on chronic systolic (congestive) heart failure (HCC) Active Problems:   Atrial fibrillation with rapid ventricular response (HCC)   Essential hypertension   Mixed hyperlipidemia   Acquired  hypothyroidism   Obesity (BMI 30-39.9)   CKD (chronic kidney disease), stage IV (HCC)   Iron deficiency anemia   Gout   Prediabetes   Pulmonary nodule  Acute on chronic systolic CHF BNP 6,063.0 (this was 2089.8 on 11/18/2021) Chest x-ray showed enlarged cardiopericardial silhouette with pulmonary vascular congestion Continue total input/output, daily weights and fluid restriction Continue IV Lasix 40 twice daily, home torsemide will be held at this time Continue heart healthy diet  Echocardiogram done on 09/29/2021 showed LVEF  <20%.  LV and severely decreased function.  No RWMA.  LV diastolic parameters are indeterminate.  RV systolic  function mildly decreased.  Echocardiogram will be done in the morning  This is patient's fourth admission for CHF exacerbation since 09/28/2021; cardiology will be consulted and we shall await further recommendations  A-fib with RVR Continue Coreg and Eliquis  Incidental finding of pulmonary nodule Small nodular density at the right base.  CT chest without contrast recommended to further evaluate  Iron deficiency anemia Continue ferrous sulfate  Essential hypertension Continue Lasix, Coreg  Mixed hyperlipidemia Continue Lipitor  Acquired hypothyroidism Continue Synthroid  CKD stage IV BUN/creatinine 49/3.36 (creatinine is within baseline range) Renally adjust medications, avoid nephrotoxic agents/dehydration/hypotension  Prediabetes A1c 7.2 (11/19/2021) Continue ISS and hypoglycemic protocol  Gout Continue allopurinol  Obesity (BMI 35.84) Continue diet and lifestyle modification  DVT prophylaxis: Eliquis  Code Status: Full code  Consults: Cardiology  Family Communication: None at bedside  Severity of Illness: The appropriate patient status for this patient is INPATIENT. Inpatient status is judged to be reasonable and necessary in order to provide the required intensity of service to ensure the patient's safety. The patient's  presenting symptoms, physical exam findings, and initial radiographic and laboratory data in the context of their chronic comorbidities is felt to place them at high risk for further clinical deterioration. Furthermore, it is not anticipated that the patient will be medically stable for discharge from the hospital within 2 midnights of admission.   * I certify that at the point of admission it is my clinical judgment that the patient will require inpatient hospital care spanning beyond 2 midnights from the point of admission due to high intensity of service, high risk for further deterioration and high frequency of surveillance required.*  Author: Bernadette Hoit, DO 12/07/2021 7:56 PM  For on call review [email or web address].

## 2021-12-07 NOTE — ED Provider Triage Note (Signed)
  Emergency Medicine Provider Triage Evaluation Note  Hector [name] , a 56 y.o.male,  was evaluated in triage.  Pt complains of fluid buildup in abdomen and lower legs.  He states that has been going on for the past 3 days.  He has been taking his fluid pills as prescribed.  He does have a history of heart failure.  He states that he is concerned that fluid is building up in his lungs.   Review of Systems  Positive: Abdominal pain, lower leg pain Negative: Denies fever, chest pain, vomiting  Physical Exam   Vitals:   12/07/21 1409  BP: 110/86  Pulse: 71  Resp: 20  Temp: 97.6 F (36.4 C)  SpO2: 95%   Gen:   Awake, no distress   Resp:  Normal effort  MSK:   Moves extremities without difficulty  Other:    Medical Decision Making  Given the patient's initial medical screening exam, the following diagnostic evaluation has been ordered. The patient will be placed in the appropriate treatment space, once one is available, to complete the evaluation and treatment. I have discussed the plan of care with the patient and I have advised the patient that an ED physician or mid-level practitioner will reevaluate their condition after the test results have been received, as the results may give them additional insight into the type of treatment they may need.    Diagnostics: CXR, labs, UA.  Treatments: none immediately   Teodoro Spray, [place] 12/07/21 1658

## 2021-12-07 NOTE — ED Notes (Signed)
Report to amanda, rn 

## 2021-12-07 NOTE — ED Triage Notes (Signed)
Patient c/o fluid buildup on abdomen and bilateral legs that started X3 days ago. Reports he has been taking his fluid pills as prescribed. C/o abdominal pain  Hx CHF

## 2021-12-07 NOTE — ED Notes (Signed)
Pt to xray, warm blankets provided.

## 2021-12-07 NOTE — ED Provider Notes (Signed)
[facility] Provider Note    Event Date/Time   First MD Initiated Contact with Patient 12/07/21 1721     (approximate)   History   Joint Swelling and Abdominal Pain   HPI  Hector [name] is a 56 y.o. male history of CHF on torsemide has been compliant with his medications presenting to the ER for evaluation of worsening edema now with pitting edema up to his abdomen orthopnea exertional dyspnea.  Denies any chest pain.  States he is actually increased his torsemide but does not feel like it is working.  Was recently admitted the beginning of the month for aggressive diuresis.     Physical Exam   Triage Vital Signs: ED Triage Vitals  Enc Vitals Group     BP 12/07/21 1409 110/86     Pulse Rate 12/07/21 1409 71     Resp 12/07/21 1409 20     Temp 12/07/21 1409 97.6 F (36.4 C)     Temp Source 12/07/21 1409 Oral     SpO2 12/07/21 1409 95 %     Weight 12/07/21 1406 257 lb (116.6 kg)     Height 12/07/21 1406 5\' 11"  (1.803 m)     Head Circumference --      Peak Flow --      Pain Score 12/07/21 1405 6     Pain Loc --      Pain Edu? --      Excl. in [place]? --     Most recent vital signs: Vitals:   12/07/21 1409 12/07/21 1757  BP: 110/86 (!) 121/102  Pulse: 71 85  Resp: 20 18  Temp: 97.6 F (36.4 C) 98.7 F (37.1 C)  SpO2: 95% 98%     Constitutional: Alert  Eyes: Conjunctivae are normal.  Head: Atraumatic. Nose: No congestion/rhinnorhea. Mouth/Throat: Mucous membranes are moist.   Neck: Painless ROM.  Cardiovascular:   Good peripheral circulation. Respiratory: Normal respiratory effort.  No retractions.  Gastrointestinal: Soft and nontender.  Musculoskeletal:  no deformity Neurologic:  MAE spontaneously. No gross focal neurologic deficits are appreciated.  Skin:  Skin is warm, dry and intact. No rash noted. Psychiatric: Mood and affect are normal. Speech and behavior are normal.    ED Results / Procedures / Treatments    Labs (all labs ordered are listed, but only abnormal results are displayed) Labs Reviewed  COMPREHENSIVE METABOLIC PANEL - Abnormal; Notable for the following components:      Result Value   Glucose, Bld 116 (*)    BUN 49 (*)    Creatinine, Ser 3.36 (*)    Calcium 8.6 (*)    Total Bilirubin 1.9 (*)    GFR, Estimated 21 (*)    All other components within normal limits  CBC - Abnormal; Notable for the following components:   MCH 24.6 (*)    RDW 22.5 (*)    All other components within normal limits  BRAIN NATRIURETIC PEPTIDE - Abnormal; Notable for the following components:   B Natriuretic Peptide 1,874.7 (*)    All other components within normal limits  LIPASE, BLOOD  URINALYSIS, ROUTINE W REFLEX MICROSCOPIC  TROPONIN I (HIGH SENSITIVITY)     EKG  ED ECG REPORT I, Merlyn Lot, the attending physician, personally viewed and interpreted this ECG.   Date: 12/07/2021  EKG Time: 19:08  Rate: 105  Rhythm: aflutter  Axis: normal  Intervals: normal qt  ST&T Change: nonspecific st abn    RADIOLOGY Please see ED  Course for my review and interpretation.  I personally reviewed all radiographic images ordered to evaluate for the above acute complaints and reviewed radiology reports and findings.  These findings were personally discussed with the patient.  Please see medical record for radiology report.    PROCEDURES:  Critical Care performed: No  Procedures   MEDICATIONS ORDERED IN ED: Medications  furosemide (LASIX) injection 60 mg (60 mg Intravenous Given 12/07/21 1755)     IMPRESSION / MDM / ASSESSMENT AND PLAN / ED COURSE  I reviewed the triage vital signs and the nursing notes.                              Differential diagnosis includes, but is not limited to, Asthma, copd, CHF, pna, ptx, malignancy, Pe, anemia  Patient presenting to the ER for evaluation of symptoms as described above.  Based on symptoms, risk factors and considered above  differential, this presenting complaint could reflect a potentially life-threatening illness therefore the patient will be placed on continuous pulse oximetry and telemetry for monitoring.  Laboratory evaluation will be sent to evaluate for the above complaints.  X-ray on my review and interpretation does show evidence of pulmonary edema and cardiomegaly.    Clinical Course as of 12/07/21 1912  Nancy Fetter Dec 07, 2021  1856 On further review of patient's record he is actually 2 kilograms over his previous admission weight for which he was admitted for several days and diuresed over 17 L.  He is having worsening exertional dyspnea and orthopnea with failure on chest x-ray will consult hospitalist for admission. [PR]    Clinical Course User Index [PR] Merlyn Lot, MD     FINAL CLINICAL IMPRESSION(S) / ED DIAGNOSES   Final diagnoses:  Acute congestive heart failure, unspecified heart failure type [facility])     Rx / DC Orders   ED Discharge Orders     None        Note:  This document was prepared using Dragon voice recognition software and may include unintentional dictation errors.    Merlyn Lot, MD 12/07/21 [phone]

## 2021-12-07 NOTE — ED Notes (Signed)
Verbal report received from [facility].

## 2021-12-08 ENCOUNTER — Inpatient Hospital Stay (HOSPITAL_COMMUNITY)
Admit: 2021-12-08 | Discharge: 2021-12-08 | Disposition: A | Discharge status: Home / Self Care | Payer: Medicaid Other | Attending: Internal Medicine | Admitting: Internal Medicine

## 2021-12-08 DIAGNOSIS — I5021 Acute systolic (congestive) heart failure: Secondary | ICD-10-CM

## 2021-12-08 DIAGNOSIS — I4819 Other persistent atrial fibrillation: Secondary | ICD-10-CM

## 2021-12-08 DIAGNOSIS — I5023 Acute on chronic systolic (congestive) heart failure: Secondary | ICD-10-CM | POA: Diagnosis not present

## 2021-12-08 LAB — CBC
HCT: 43.8 % (ref 39.0–52.0)
Hemoglobin: 13.5 g/dL (ref 13.0–17.0)
MCH: 24.7 pg — ABNORMAL LOW (ref 26.0–34.0)
MCHC: 30.8 g/dL (ref 30.0–36.0)
MCV: 80.2 fL (ref 80.0–100.0)
Platelets: 239 10*3/uL (ref 150–400)
RBC: 5.46 MIL/uL (ref 4.22–5.81)
RDW: 22.5 % — ABNORMAL HIGH (ref 11.5–15.5)
WBC: 8.9 10*3/uL (ref 4.0–10.5)
nRBC: 0 % (ref 0.0–0.2)

## 2021-12-08 LAB — ECHOCARDIOGRAM LIMITED
AR max vel: 4.42 cm2
AV Area VTI: 4.4 cm2
AV Area mean vel: 3.94 cm2
AV Mean grad: 1 mmHg
AV Peak grad: 1.9 mmHg
Ao pk vel: 0.68 m/s
Area-P 1/2: 5.31 cm2
Calc EF: 27.1 %
Height: 71 in
S' Lateral: 5.2 cm
Single Plane A2C EF: 27.6 %
Single Plane A4C EF: 23.8 %
Weight: 4112 oz

## 2021-12-08 LAB — COMPREHENSIVE METABOLIC PANEL
ALT: 14 U/L (ref 0–44)
AST: 17 U/L (ref 15–41)
Albumin: 3.4 g/dL — ABNORMAL LOW (ref 3.5–5.0)
Alkaline Phosphatase: 67 U/L (ref 38–126)
Anion gap: 9 (ref 5–15)
BUN: 49 mg/dL — ABNORMAL HIGH (ref 6–20)
CO2: 26 mmol/L (ref 22–32)
Calcium: 8.7 mg/dL — ABNORMAL LOW (ref 8.9–10.3)
Chloride: 105 mmol/L (ref 98–111)
Creatinine, Ser: 3.21 mg/dL — ABNORMAL HIGH (ref 0.61–1.24)
GFR, Estimated: 22 mL/min — ABNORMAL LOW (ref >60)
Glucose, Bld: 106 mg/dL — ABNORMAL HIGH (ref 70–99)
Potassium: 3.9 mmol/L (ref 3.5–5.1)
Sodium: 140 mmol/L (ref 135–145)
Total Bilirubin: 2.3 mg/dL — ABNORMAL HIGH (ref 0.3–1.2)
Total Protein: 6.6 g/dL (ref 6.5–8.1)

## 2021-12-08 LAB — CBG MONITORING, ED
Glucose-Capillary: 172 mg/dL — ABNORMAL HIGH (ref 70–99)
Glucose-Capillary: 97 mg/dL (ref 70–99)
Glucose-Capillary: 124 mg/dL — ABNORMAL HIGH (ref 70–99)

## 2021-12-08 LAB — PHOSPHORUS: Phosphorus: 3.8 mg/dL (ref 2.5–4.6)

## 2021-12-08 LAB — APTT: aPTT: 24 seconds (ref 24–36)

## 2021-12-08 LAB — MAGNESIUM: Magnesium: 2.1 mg/dL (ref 1.7–2.4)

## 2021-12-08 LAB — GLUCOSE, CAPILLARY: Glucose-Capillary: 125 mg/dL — ABNORMAL HIGH (ref 70–99)

## 2021-12-08 NOTE — Progress Notes (Signed)
Nutrition Brief Note  RD pulled to chart secondary to CHF.   Wt Readings from Last 15 Encounters:  12/07/21 116.6 kg  11/27/21 113.4 kg  11/24/21 111.3 kg  10/20/21 108.5 kg  10/02/21 111 kg  09/10/21 121.2 kg  08/19/21 121.2 kg  07/29/21 123.4 kg  07/28/21 123.8 kg  05/23/21 126.2 kg  05/13/21 121.3 kg  05/06/21 123.8 kg  04/30/21 125.2 kg  04/30/21 127 kg  04/21/21 121.7 kg   Pt with medical history significant of hypertension, hyperlipidemia, combined systolic/diastolic CHF, hypothyroidism, atrial fibrillation on Eliquis, CKD stage IV and obesity who presents due to increased shortness of breath, increased leg swelling and abdominal distention which started 4 days ago  Pt admitted with CHF exacerbation.   RD provided "Low Sodium Nutrition Therapy" handout from [facility] Care Manual"; attached to AVS/ discharge summary.   Body mass index is 35.84 kg/m. Patient meets criteria for obesity, class II based on current BMI. Obesity is a complex, chronic medical condition that is optimally managed by a multidisciplinary care team. Weight loss is not an ideal goal for an acute inpatient hospitalization. However, if further work-up for obesity is warranted, consider outpatient referral to outpatient bariatric service and/or [facility].    Current diet order is Pt currently on a heart healthy, carb modified diet (liberalize to 2 gram sodium), patient is consuming approximately n/a% of meals at this time. Labs and medications reviewed.   No nutrition interventions warranted at this time. If nutrition issues arise, please consult RD.   Loistine Chance, RD, LDN, [place] Registered Dietitian II Certified Diabetes Care and Education Specialist Please refer to [facility] for RD and/or RD on-call/weekend/after hours pager

## 2021-12-08 NOTE — Progress Notes (Signed)
PROGRESS NOTE    Hector [name]  TJQ:[id number] DOB: 1965/09/18 DOA: 12/07/2021 PCP: [name], MD    Brief Narrative:  Hector [name] is a 56 y.o. male with medical history significant of hypertension, hyperlipidemia, combined systolic/diastolic CHF, hypothyroidism, atrial fibrillation on Eliquis, CKD stage IV and obesity who presents to the emergency department due to increased shortness of breath, increased leg swelling and abdominal distention which started 4 days ago.  Patient states that he has been compliant with his medications, however, it is unknown if patient has been compliant with diet.  Patient was recently admitted from 9/26 of 10/2 due to acute on chronic systolic CHF which was managed with IV Lasix with greater than 15 L of fluid diuresed and about 7 pounds weight loss with weight being closer to patient's dry weight prior to discharge time  Consultants:  Cardiology  Procedures:  Cxr Enlarged cardiopericardial silhouette with pulmonary vascular congestion.   Small nodular density at the right base. CT chest without contrast recommended to further evaluate.  CT scan chest . Right lower lobe calcified granulomas. 2. Minimal ground-glass and strandy opacities in the right upper lobe and right middle lobe favored as scarring. 3. Mild cardiomegaly. 4. Bilateral renal cysts. No follow-up imaging recommended.    Antimicrobials:     Subjective: Feels little better, little less sob  Objective: Vitals:   12/07/21 2234 12/07/21 2341 12/08/21 0210 12/08/21 0650  BP:  (!) 122/95 (!) 131/96 (!) 116/100  Pulse:  85 (!) 103 98  Resp: (!) 22 20 18  (!) 22  Temp:  98.3 F (36.8 C) 98 F (36.7 C)   TempSrc:  Oral Oral   SpO2:  94% 96% 97%  Weight:      Height:        Intake/Output Summary (Last 24 hours) at 12/08/2021 0912 Last data filed at 12/08/2021 0526 Gross per 24 hour  Intake 1308 ml  Output 1200 ml  Net 108 ml   Filed Weights    12/07/21 1406  Weight: 116.6 kg    Examination:  Calm, NAD +crackles, no wheezing Reg s1/s2 no gallop Soft benign +bs Decrease LE edema b/l Aaoxox3  Mood and affect appropriate in current setting     Data Reviewed: I have personally reviewed following labs and imaging studies  CBC: Recent Labs  Lab 12/07/21 1411 12/08/21 0436  WBC 7.6 8.9  HGB 13.6 13.5  HCT 44.6 43.8  MCV 80.8 80.2  PLT 245 762   Basic Metabolic Panel: Recent Labs  Lab 12/07/21 1411 12/08/21 0436  NA 139 140  K 4.1 3.9  CL 108 105  CO2 25 26  GLUCOSE 116* 106*  BUN 49* 49*  CREATININE 3.36* 3.21*  CALCIUM 8.6* 8.7*  MG  --  2.1  PHOS  --  3.8   GFR: Estimated Creatinine Clearance: 33 mL/min (A) (by C-G formula based on SCr of 3.21 mg/dL (H)). Liver Function Tests: Recent Labs  Lab 12/07/21 1411 12/08/21 0436  AST 15 17  ALT 15 14  ALKPHOS 72 67  BILITOT 1.9* 2.3*  PROT 6.9 6.6  ALBUMIN 3.6 3.4*   Recent Labs  Lab 12/07/21 1411  LIPASE 24   No results for input(s): "AMMONIA" in the last 168 hours. Coagulation Profile: No results for input(s): "INR", "PROTIME" in the last 168 hours. Cardiac Enzymes: No results for input(s): "CKTOTAL", "CKMB", "CKMBINDEX", "TROPONINI" in the last 168 hours. BNP (last 3 results) No results for input(s): "PROBNP" in the last  8760 hours. HbA1C: No results for input(s): "HGBA1C" in the last 72 hours. CBG: Recent Labs  Lab 12/07/21 2231 12/08/21 0906  GLUCAP 163* 172*   Lipid Profile: No results for input(s): "CHOL", "HDL", "LDLCALC", "TRIG", "CHOLHDL", "LDLDIRECT" in the last 72 hours. Thyroid Function Tests: No results for input(s): "TSH", "T4TOTAL", "FREET4", "T3FREE", "THYROIDAB" in the last 72 hours. Anemia Panel: No results for input(s): "VITAMINB12", "FOLATE", "FERRITIN", "TIBC", "IRON", "RETICCTPCT" in the last 72 hours. Sepsis Labs: No results for input(s): "PROCALCITON", "LATICACIDVEN" in the last 168 hours.  No results found  for this or any previous visit (from the past 240 hour(s)).       Radiology Studies: CT CHEST WO CONTRAST  Result Date: 12/07/2021 CLINICAL DATA:  Lung nodule. EXAM: CT CHEST WITHOUT CONTRAST TECHNIQUE: Multidetector CT imaging of the chest was performed following the standard protocol without IV contrast. RADIATION DOSE REDUCTION: This exam was performed according to the departmental dose-optimization program which includes automated exposure control, adjustment of the mA and/or kV according to patient size and/or use of iterative reconstruction technique. COMPARISON:  Chest x-ray 12/07/2021 FINDINGS: Cardiovascular: The heart is mildly enlarged. Aorta is normal in size. There is no pericardial effusion. There are atherosclerotic calcifications of the aorta and coronary arteries. Mediastinum/Nodes: No enlarged mediastinal or axillary lymph nodes. Thyroid gland, trachea, and esophagus demonstrate no significant findings. Lungs/Pleura: There are calcified granulomas in the right lower lobe. There are minimal ground-glass and strandy opacities in the inferior right upper lobe and right middle lobe favored as scarring. No pleural effusion or pneumothorax. Trachea and central airways are patent. Upper Abdomen: Bilateral renal cysts are present. These measure up to 3.8 cm on the left. Musculoskeletal: No chest wall mass or suspicious bone lesions identified. IMPRESSION: 1. Right lower lobe calcified granulomas. 2. Minimal ground-glass and strandy opacities in the right upper lobe and right middle lobe favored as scarring. 3. Mild cardiomegaly. 4. Bilateral renal cysts. No follow-up imaging recommended. Aortic Atherosclerosis (ICD10-I70.0). Electronically Signed   By: Ronney Asters M.D.   On: 12/07/2021 21:08   DG Chest 2 View  Result Date: 12/07/2021 CLINICAL DATA:  Heart failure. EXAM: CHEST - 2 VIEW COMPARISON:  11/18/2021 FINDINGS: 1713 hours. The cardio pericardial silhouette is enlarged. There is  pulmonary vascular congestion without overt pulmonary edema. Small nodular density seen lateral right costophrenic sulcus. No substantial pleural effusion. IMPRESSION: Enlarged cardiopericardial silhouette with pulmonary vascular congestion. Small nodular density at the right base. CT chest without contrast recommended to further evaluate. Electronically Signed   By: Misty Stanley M.D.   On: 12/07/2021 17:27        Scheduled Meds:  allopurinol  50 mg Oral Daily   apixaban  5 mg Oral BID   atorvastatin  80 mg Oral Daily   carvedilol  6.25 mg Oral BID WC   ferrous sulfate  325 mg Oral Q breakfast   furosemide  40 mg Intravenous Q12H   insulin aspart  0-9 Units Subcutaneous TID WC   levothyroxine  100 mcg Oral QAC breakfast   Continuous Infusions:  Assessment & Plan:   Principal Problem:   Acute on chronic systolic (congestive) heart failure (HCC) Active Problems:   Atrial fibrillation with rapid ventricular response (HCC)   Essential hypertension   Mixed hyperlipidemia   Acquired hypothyroidism   Obesity (BMI 30-39.9)   CKD (chronic kidney disease), stage IV (HCC)   Iron deficiency anemia   Gout   Prediabetes   Pulmonary nodule   Acute on  chronic systolic CHF BNP 8,416.6 (this was 2089.8 on 11/18/2021) Chest x-ray showed enlarged cardiopericardial silhouette with pulmonary vascular congestion Continue total input/output, daily weights and fluid restriction Continue IV Lasix 40 twice daily, home torsemide will be held at this time Continue heart healthy diet      Echocardiogram done on 09/29/2021 showed LVEF  <20%.  LV and severely decreased function.  No RWMA.  LV diastolic parameters are indeterminate.  RV systolic function mildly decreased.  Echocardiogram will be done in the morning  This is patient's fourth admission for CHF exacerbation since 09/28/2021 10/16 cardiologist input was appreciated.  He is not a candidate for ACE inhibitor/ARB/MR ACE/ARN 2/2 ckd Continue IV  Lasix    A-fib with RVR Continue Coreg and Eliquis 10/16 per cardiology Patient was advised on last admission he will need outpatient EP consultation but he had to demonstrate compliance with medical therapy   Incidental finding of pulmonary nodule Small nodular density at the right base.  10/16 CT of the chest did not reveal any further recommendation    Iron deficiency anemia Continue ferrous sulfate   Essential hypertension Continue Lasix, Coreg   Mixed hyperlipidemia Continue Lipitor   Acquired hypothyroidism Continue Synthroid   CKD stage IV BUN/creatinine 49/3.36 (creatinine is within baseline range) Renally adjust medications, avoid nephrotoxic agents/dehydration/hypotension   Prediabetes A1c 7.2 (11/19/2021) Continue ISS and hypoglycemic protocol   Gout Continue allopurinol   Obesity (BMI 35.84) Continue diet and lifestyle modification     DVT prophylaxis: Eliquis Code Status: DNR Family Communication: None at bedside Disposition Plan: Home Status is: Inpatient Remains inpatient appropriate because: IV treatment.  Still in CHF.        LOS: 1 day   Time spent: 35 minutes    Nolberto Hanlon, MD [facility] Pager [phone]  If 7PM-7AM, please contact night-coverage 12/08/2021, 9:12 AM

## 2021-12-08 NOTE — Consult Note (Signed)
Cardiology Consultation   [name] MRN: [id number]; DOB: Jul 12, 1965  Admit date: 12/07/2021 Date of Consult: 12/08/2021  PCP:  Hector [name], [place] [facility] Cardiologist:  Hector Rogue, MD        Patient Profile:   Hector [name] is a 56 y.o. male with a hx of chronic combined CHF/NICM likely secondary to alcohol use, hypertension, hyperlipidemia, paroxysmal atrial fibs/atrial flutter, DM 2, hypothyroidism, OSA on CPAP, CKD stage III-4, medication noncompliance, GI bleed on apixaban, possible sleep apnea  who is being seen 12/08/2021 for the evaluation of volume overload at the request of Dr Hector [name].  History of Present Illness:   Hector [name] is a 56 year old male with a history of heart failure that dates back to at least 2009, spent to be presumably nonischemic in the setting of alcohol use, with an EF of 20%.  He was admitted to the hospital in 02/2014 with fluid overload.  Echocardiogram demonstrated an EF of 20-25%.  He was first seen in clinic on 03/2014 for hospital follow-up.  It was noted he did undergone several ischemic evaluations in the past through outside office.  Left heart catheterization in 12/2014 showed minimal CAD, which confirmed presumptive diagnosis of nonischemic cardiomyopathy.  He was referred to EP with plans to pursue ICD.  However it was canceled given improvement in his LV systolic function after restoration of sinus rhythm by echocardiogram in 01/2015.  Since then he has had fluctuations in EF as outlined below.  With regards to his atrial fibrillation/atrial flutter he had previously been managed with amiodarone, was discontinued secondary to abnormal thyroid function.  He underwent TEE/DCCV in 02/2021 at that time showed an EF of less than 20% with severely reduced RV systolic function.  He was reevaluated by EP in 04/2021 and felt not to be a good candidate for ICD or ablation given medication  noncompliance.  He was last seen in clinic in 04/2021 and reported that he had been taking his medication.  He was maintaining sinus rhythm at that time.  Since that visit he had been evaluated in the emergency department 06/2021 with abdominal distention and abdominal pain.  He was treated with IV Lasix with improvement in his symptoms and discharged outpatient follow-up.  He was then readmitted to the hospital on 08/2021 with continued abdominal discomfort and distention.  He underwent extensive GI work-up including MRCP and HIDA scan with conservative therapy recommended.  Due to his worsening renal function, it was recommended to hold torsemide at discharge.  From cardiac perspective he was continued on apixaban 5 mg twice daily, atorvastatin 80 mg daily, carvedilol 6.25 mg twice daily, Farxiga 10 mg daily hydralazine 25 mg 3 times a day.  Following his hospital discharge he was nonadherent to his medications, indicating he took them for least 2 weeks prior.  He continued to note ongoing abdominal pain and swelling.  No significant lower extremity swelling or orthopnea.  He denied chest pain or significant dyspnea.  He was uncertain about weight gain.  He was admitted to the hospital 09/28/2021 for acute on chronic combined systolic and diastolic CHF in the context of medication nonadherence.  He had a BNP of 3800 with a baseline around 15 [phone], BUN SCR 39-3 0.25 trending to 44-3 0.4 which appears to be consistent with prior readings.  EKG was then performed.  Chest x-ray showed enlarged cardiac silhouette with pulmonary edema.  In the emergency department he received IV Lasix  80 mg which was continued twice daily dosing upon admission, documented urine output 2.5 L for the admission upon cardiology consultation he reports abdominal swelling somewhat improved though not back to baseline.  He had remained in atrial fibrillation with ventricular rates in the 80s to low 100s predominantly with occasional  readings in the 120s.  He was again evaluated in the emergency department on 11/18/2021 for shortness of breath and abdominal pain that had been ongoing for approximately 2 weeks prior to his presentation to the emergency department.  BNP was 2089, chest x-ray with cardiomegaly and central vascular congestion.  Patient diuresed over 15 L and almost -12 L deficit during his hospitalization continued on IV Lasix he was subsequently discharged home with prescriptions for refills for his torsemide potassium and his Synthroid.  He returned back to the [facility] emergency department 12/07/2021 with complaint of joint swelling and abdominal pain.  He was seen for worsening edema now with pitting edema up to his abdomen with orthopnea and exertional dyspnea.  He denied any chest pain, chest pressure, palpitations, orthopnea, or PND.  He stated that he had increased his torsemide but does not feel like it was working.  When asked if he had been taking all of his medications as prescribed he stated that he may have missed a couple of doses.  He also stated his biggest concern is that he had an increased amount of fluid again when asked about fluid consumption he is sedated not been following his recommended fluid restriction.  Initial vitals: Blood pressure 110/86, pulse 71, respirations of 20, temperature 97.6,  Pertinent labs: Glucose 116, BUN 49, serum creatinine 3.36, calcium 8.6, GFR 21, BNP 1874  Imaging: CT of the chest revealed right lower lobe calcified granulomas, minimal groundglass and strandy opacities in the right upper lobe and right middle lobe favored to scarring, mild cardiomegaly, bilateral renal cysts; chest x-ray revealed enlarged cardiopericardial silhouette with pulmonary vascular congestion, small nodular distally at the right base  Medications given in the emergency department: Lasix 60 mg IVP  Past Medical History:  Diagnosis Date   Alcohol abuse    Alcoholic cardiomyopathy ([place])    a.  12/2007 MV: EF 28%, no isch;  b. 8/12 Echo: EF 25-35%; c. 02/2014 Echo: EF 20-25%; d. 12/2014 Cath: minimal CAD; e. 01/2015 Echo: EF 50-55%;  d. 05/2016 Echo: EF 30-35%, diff HK, gr2 DD; e. 11/2016 Echo: EF 45-50%, diff HK; f. 09/2021 Echo: EF <20%.   Chronic combined systolic (congestive) and diastolic (congestive) heart failure ([place])    a. 05/2016 Echo: EF 30-35%, diff HK, Gr2 DD, mild MR, mod dil LA; b. 11/2016 Echo: EF 45-50%, diff HK; c. 09/2021 Echo: EF < 20%, no rwma, mildly reduced RV fxn, mod dil LA, mildly dil RA, mod MR, AoV sclerosis.   CKD (chronic kidney disease), stage III (HCC)    Elevated troponin (chronic)    Essential hypertension    GI bleed 11/2013   Hyperlipidemia    Pancreatitis    Paroxysmal A-fib (HCC)    a. new onset s/p unsuccessful TEE/DCCV on 08/16/2014; b. CHA2DS2VASc = 3-> eliquis (freq noncompliant); c. 02/2021 s/p TEE/DCCV; d. Prev on amio->d/c 2/2 abnl TFTs.   Paroxysmal atrial flutter (HCC)    Sleep-disordered breathing    Has yet to have a sleep study   Stroke [facility])     Past Surgical History:  Procedure Laterality Date   CARDIAC CATHETERIZATION N/A 01/09/2015   Procedure: Left Heart Cath and Coronary Angiography;  Surgeon: [name], MD;  Location: [facility];  Service: Cardiovascular;  Laterality: N/A;   CARDIOVERSION N/A 03/05/2021   Procedure: CARDIOVERSION;  Surgeon: Kate Sable, MD;  Location: [facility] ORS;  Service: Cardiovascular;  Laterality: N/A;   COLONOSCOPY N/A 07/22/2020   Procedure: COLONOSCOPY;  Surgeon: Toledo, Benay Pike, MD;  Location: [facility] ENDOSCOPY;  Service: Gastroenterology;  Laterality: N/A;   ELECTROPHYSIOLOGIC STUDY N/A 08/16/2014   Procedure: CARDIOVERSION;  Surgeon: [name], MD;  Location: [facility] ORS;  Service: Cardiovascular;  Laterality: N/A;   FLEXIBLE SIGMOIDOSCOPY N/A 10/10/2015   Procedure: FLEXIBLE SIGMOIDOSCOPY;  Surgeon: [name], MD;  Location: [facility] ENDOSCOPY;  Service: Endoscopy;  Laterality:  N/A;   KNEE SURGERY Right    NM MYOVIEW LTD  November 2011   No ischemia or infarction. EF 50-55% ( no improvement from 2009 Myoview EF of 28%   TEE WITHOUT CARDIOVERSION N/A 08/16/2014   Procedure: TRANSESOPHAGEAL ECHOCARDIOGRAM (TEE);  Surgeon: [name], MD;  Location: [facility] ORS;  Service: Cardiovascular;  Laterality: N/A;   TEE WITHOUT CARDIOVERSION N/A 03/05/2021   Procedure: TRANSESOPHAGEAL ECHOCARDIOGRAM (TEE);  Surgeon: Kate Sable, MD;  Location: [facility] ORS;  Service: Cardiovascular;  Laterality: N/A;     Home Medications:  Prior to Admission medications   Medication Sig Start Date End Date Taking? Authorizing Provider  allopurinol (ZYLOPRIM) 100 MG tablet Take 0.5 tablets (50 mg total) by mouth daily. 10/03/21  Yes Danford, Suann Larry, MD  apixaban (ELIQUIS) 5 MG TABS tablet Take 1 tablet (5 mg total) by mouth 2 (two) times daily. 10/02/21  Yes Danford, Suann Larry, MD  atorvastatin (LIPITOR) 80 MG tablet Take 1 tablet (80 mg total) by mouth daily. 11/24/21  Yes Annita Brod, MD  carvedilol (COREG) 6.25 MG tablet Take 1 tablet (6.25 mg total) by mouth 2 (two) times daily with a meal. 10/02/21  Yes Danford, Suann Larry, MD  dapagliflozin propanediol (FARXIGA) 10 MG TABS tablet Take 1 tablet (10 mg total) by mouth daily before breakfast. 10/02/21  Yes Danford, Suann Larry, MD  hydrALAZINE (APRESOLINE) 25 MG tablet Take 1 tablet (25 mg total) by mouth 3 (three) times daily. 10/02/21  Yes Danford, Suann Larry, MD  isosorbide dinitrate (ISORDIL) 5 MG tablet Take 1 tablet (5 mg total) by mouth 3 (three) times daily. 10/20/21 12/07/21 Yes Wyvonnia Dusky, MD  levothyroxine (SYNTHROID) 100 MCG tablet Take 1 tablet (100 mcg total) by mouth daily before breakfast. 11/24/21  Yes Annita Brod, MD  potassium chloride SA (KLOR-CON M) 20 MEQ tablet Take 1 tablet (20 mEq total) by mouth daily. 11/24/21  Yes Annita Brod, MD  Torsemide 40 MG TABS Take 40 mg by mouth 2  (two) times daily. Patient taking differently: Take 40 mg by mouth daily. 11/24/21 12/24/21 Yes Annita Brod, MD    Inpatient Medications: Scheduled Meds:  allopurinol  50 mg Oral Daily   apixaban  5 mg Oral BID   atorvastatin  80 mg Oral Daily   carvedilol  6.25 mg Oral BID WC   ferrous sulfate  325 mg Oral Q breakfast   furosemide  40 mg Intravenous Q12H   insulin aspart  0-9 Units Subcutaneous TID WC   levothyroxine  100 mcg Oral QAC breakfast   Continuous Infusions:  PRN Meds:   Allergies:    Allergies  Allergen Reactions   Esomeprazole Magnesium Other (See Comments)    Suspected interstitial nephritis 2018   Eggs Or Egg-Derived Products Rash  Social History:   Social History   Socioeconomic History   Marital status: Single    Spouse name: Not on file   Number of children: Not on file   Years of education: Not on file   Highest education level: Not on file  Occupational History   Not on file  Tobacco Use   Smoking status: Former    Packs/day: 1.00    Years: 12.00    Total pack years: 12.00    Types: Cigarettes    Quit date: 02/23/1998    Years since quitting: 23.8   Smokeless tobacco: Never  Vaping Use   Vaping Use: Never used  Substance and Sexual Activity   Alcohol use: Not Currently    Alcohol/week: 0.0 standard drinks of alcohol    Comment: Past heavy drinker   Drug use: No   Sexual activity: Not Currently  Other Topics Concern   Not on file  Social History Narrative   Not on file   Social Determinants of Health   Financial Resource Strain: Low Risk  (01/04/2019)   Overall Financial Resource Strain (CARDIA)    Difficulty of Paying Living Expenses: Not hard at all  Food Insecurity: No Food Insecurity (12/08/2021)   Hunger Vital Sign    Worried About Running Out of Food in the Last Year: Never true    [place] in the Last Year: Never true  Transportation Needs: No Transportation Needs (12/08/2021)   PRAPARE - Armed forces logistics/support/administrative officer (Medical): No    Lack of Transportation (Non-Medical): No  Physical Activity: Inactive (01/04/2019)   Exercise Vital Sign    Days of Exercise per Week: 0 days    Minutes of Exercise per Session: 0 min  Stress: No Stress Concern Present (01/04/2019)   [place]    Feeling of Stress : Not at all  Social Connections: Moderately Isolated (01/04/2019)   Social Connection and Isolation Panel [NHANES]    Frequency of Communication with Friends and Family: More than three times a week    Frequency of Social Gatherings with Friends and Family: More than three times a week    Attends Religious Services: 1 to 4 times per year    Active Member of [employer] or Organizations: No    Attends Archivist Meetings: Never    Marital Status: Never married  Intimate Partner Violence: Not At Risk (12/08/2021)   Humiliation, Afraid, Rape, and Kick questionnaire    Fear of Current or Ex-Partner: No    Emotionally Abused: No    Physically Abused: No    Sexually Abused: No    Family History:    Family History  Problem Relation Age of Onset   Hypertension Mother    Hyperlipidemia Mother    Diabetes Mother      ROS:  Please see the history of present illness.  Review of Systems  Constitutional:  Positive for malaise/fatigue.  Respiratory:  Positive for shortness of breath.   Cardiovascular:  Positive for leg swelling.  Gastrointestinal:  Positive for abdominal pain.  Neurological:  Positive for weakness.    All other ROS reviewed and negative.     Physical Exam/Data:   Vitals:   12/07/21 2341 12/08/21 0210 12/08/21 0650 12/08/21 0931  BP: (!) 122/95 (!) 131/96 (!) 116/100 (!) 126/105  Pulse: 85 (!) 103 98 92  Resp: 20 18 (!) 22 (!) 26  Temp: 98.3 F (36.8 C) 98  F (36.7 C)  98.1 F (36.7 C)  TempSrc: Oral Oral  Oral  SpO2: 94% 96% 97% 92%  Weight:      Height:        Intake/Output Summary  (Last 24 hours) at 12/08/2021 1009 Last data filed at 12/08/2021 0526 Gross per 24 hour  Intake 1308 ml  Output 1200 ml  Net 108 ml      12/07/2021    2:06 PM 11/27/2021    2:23 PM 11/24/2021    5:19 AM  Last 3 Weights  Weight (lbs) 257 lb 250 lb 245 lb 4.8 oz  Weight (kg) 116.574 kg 113.399 kg 111.267 kg     Body mass index is 35.84 kg/m.  General:  Well nourished, well developed, in no acute distress HEENT: normal Neck: unable to assess JVD due to body habitus Vascular: No carotid bruits; Distal pulses 2+ bilaterally Cardiac:  normal S1, S2; RRR; no murmur  Lungs:  clear to upper lobes with crackles in bilateral bases to auscultation bilaterally, no wheezing, rhonchi or rales, respirations are unlabored at rest on room air Abd: soft, mildly tender, obese, no hepatomegaly  Ext: 1+ edema to BLE Musculoskeletal:  No deformities, BUE and BLE strength normal and equal Skin: warm and dry  Neuro:  CNs 2-12 intact, no focal abnormalities noted Psych:  Normal affect   EKG:  The EKG was personally reviewed and demonstrates: Rate controlled atrial fibrillation, left axis deviation, rate of 105 Telemetry:  Telemetry was personally reviewed and demonstrates: Rate controlled atrial fibrillation with aberrant beats  Relevant CV Studies: TTE 09/29/2021 1. Left ventricular ejection fraction, by estimation, is <20%. The left  ventricle has severely decreased function. The left ventricle has no  regional wall motion abnormalities. The left ventricular internal cavity  size was moderately dilated. Left  ventricular diastolic parameters are indeterminate.   2. Right ventricular systolic function is mildly reduced. The right  ventricular size is normal. Tricuspid regurgitation signal is inadequate  for assessing PA pressure.   3. Left atrial size was moderately dilated.   4. Right atrial size was mildly dilated.   5. The mitral valve is normal in structure. Moderate mitral valve   regurgitation. No evidence of mitral stenosis.   6. The aortic valve is normal in structure. Aortic valve regurgitation is  not visualized. Aortic valve sclerosis/calcification is present, without  any evidence of aortic stenosis.   7. The inferior vena cava is dilated in size with <50% respiratory  variability, suggesting right atrial pressure of 15 mmHg.   Laboratory Data:  High Sensitivity Troponin:   Recent Labs  Lab 11/18/21 1234  TROPONINIHS 52*     Chemistry Recent Labs  Lab 12/07/21 1411 12/08/21 0436  NA 139 140  K 4.1 3.9  CL 108 105  CO2 25 26  GLUCOSE 116* 106*  BUN 49* 49*  CREATININE 3.36* 3.21*  CALCIUM 8.6* 8.7*  MG  --  2.1  GFRNONAA 21* 22*  ANIONGAP 6 9    Recent Labs  Lab 12/07/21 1411 12/08/21 0436  PROT 6.9 6.6  ALBUMIN 3.6 3.4*  AST 15 17  ALT 15 14  ALKPHOS 72 67  BILITOT 1.9* 2.3*   Lipids No results for input(s): "CHOL", "TRIG", "HDL", "LABVLDL", "LDLCALC", "CHOLHDL" in the last 168 hours.  Hematology Recent Labs  Lab 12/07/21 1411 12/08/21 0436  WBC 7.6 8.9  RBC 5.52 5.46  HGB 13.6 13.5  HCT 44.6 43.8  MCV 80.8 80.2  MCH 24.6* 24.7*  MCHC 30.5 30.8  RDW 22.5* 22.5*  PLT 245 239   Thyroid No results for input(s): "TSH", "FREET4" in the last 168 hours.  BNP Recent Labs  Lab 12/07/21 1411  BNP 1,874.7*    DDimer No results for input(s): "DDIMER" in the last 168 hours.   Radiology/Studies:  CT CHEST WO CONTRAST  Result Date: 12/07/2021 CLINICAL DATA:  Lung nodule. EXAM: CT CHEST WITHOUT CONTRAST TECHNIQUE: Multidetector CT imaging of the chest was performed following the standard protocol without IV contrast. RADIATION DOSE REDUCTION: This exam was performed according to the departmental dose-optimization program which includes automated exposure control, adjustment of the mA and/or kV according to patient size and/or use of iterative reconstruction technique. COMPARISON:  Chest x-ray 12/07/2021 FINDINGS:  Cardiovascular: The heart is mildly enlarged. Aorta is normal in size. There is no pericardial effusion. There are atherosclerotic calcifications of the aorta and coronary arteries. Mediastinum/Nodes: No enlarged mediastinal or axillary lymph nodes. Thyroid gland, trachea, and esophagus demonstrate no significant findings. Lungs/Pleura: There are calcified granulomas in the right lower lobe. There are minimal ground-glass and strandy opacities in the inferior right upper lobe and right middle lobe favored as scarring. No pleural effusion or pneumothorax. Trachea and central airways are patent. Upper Abdomen: Bilateral renal cysts are present. These measure up to 3.8 cm on the left. Musculoskeletal: No chest wall mass or suspicious bone lesions identified. IMPRESSION: 1. Right lower lobe calcified granulomas. 2. Minimal ground-glass and strandy opacities in the right upper lobe and right middle lobe favored as scarring. 3. Mild cardiomegaly. 4. Bilateral renal cysts. No follow-up imaging recommended. Aortic Atherosclerosis (ICD10-I70.0). Electronically Signed   By: Ronney Asters M.D.   On: 12/07/2021 21:08   DG Chest 2 View  Result Date: 12/07/2021 CLINICAL DATA:  Heart failure. EXAM: CHEST - 2 VIEW COMPARISON:  11/18/2021 FINDINGS: 1713 hours. The cardio pericardial silhouette is enlarged. There is pulmonary vascular congestion without overt pulmonary edema. Small nodular density seen lateral right costophrenic sulcus. No substantial pleural effusion. IMPRESSION: Enlarged cardiopericardial silhouette with pulmonary vascular congestion. Small nodular density at the right base. CT chest without contrast recommended to further evaluate. Electronically Signed   By: Misty Stanley M.D.   On: 12/07/2021 17:27     Assessment and Plan:   Acute on chronic combined systolic and diastolic congestive heart failure/nonischemic cardiomyopathy -BNP 1874.7 -LVEF less than 20% on last echocardiogram -Limited  echocardiogram pending -He was recently hospitalized in August following discharge he had been noncompliant with medications -Unfortunately he is not a candidate for ACE inhibitor/ARB/MR ACE/ARN I secondary to stage IV chronic kidney disease -Continue with furosemide 40 mg IV twice daily will likely need to escalate dosing as during his last hospitalization he required 80 mg twice daily -PTA medication Farxiga currently on hold -Continue carvedilol 6.25 mg twice daily -Daily weight, I&O, low-sodium diet -Continue with heart failure education -Patient was advised on last admission he would need outpatient EP consultation but he had to demonstrate compliance with medical therapy  Persistent atrial fibrillation/atrial flutter -Remains in atrial fibrillation today -CHA2DS2-VASc score of at least 6 -Remains rate controlled -Continued on carvedilol -Continue apixaban 5 mg twice daily  Stage IV chronic kidney disease -Serum creatinine at 3.21 -Daily BMP -Monitor I&O -Monitor/trend/replete electrolytes as needed -Avoid nephrotoxic agents were able  Essential hypertension -Blood pressure 126/105 -Continued on carvedilol -Vital signs per unit protocol  Nonobstructive coronary artery disease  -Remains chest pain-free -High-sensitivity troponin elevated likely in the setting of exacerbation of CHF  causing demand ischemia -Continued on beta-blocker and statin therapy -Apixaban in lieu of aspirin  Hyperlipidemia -Continued on statin therapy  OSA -Patient states he reportedly uses CPAP at home  Hypothyroidism -Continued on Synthroid -Management per IM  Type 2 diabetes -A1c 7.2 11/19/2021 -Currently continued on insulin -Management per IM   Risk Assessment/Risk Scores:        New York Heart Association (NYHA) Functional Class NYHA Class III  CHA2DS2-VASc Score = 6   This indicates a 9.7% annual risk of stroke. The patient's score is based upon: CHF History: 1 HTN History:  1 Diabetes History: 1 Stroke History: 2 Vascular Disease History: 1 Age Score: 0 Gender Score: 0         For questions or updates, please contact [place] Please consult [email or web address] for contact info under    Signed, Mae Cianci, NP  12/08/2021 10:09 AM

## 2021-12-08 NOTE — Discharge Instructions (Signed)

## 2021-12-08 NOTE — ED Notes (Signed)
Pt refusing insulin, MD Amery notified.

## 2021-12-08 NOTE — Consult Note (Signed)
   Heart Failure Nurse Navigator Note  HFrEF <20 %.  Diastolic dysfunction was indeterminate.  Right ventricular systolic function mildly reduced.  Moderate left atrial enlargement and mild right atrial enlargement.  He presented to the emergency room with a 4-day history of increasing shortness of breath, lower extremity edema and increasing abdominal girth.  He was last discharged from the hospital on October 2 with follow-up in the outpatient heart failure clinic on October 5 where he looked good.  Comorbidities:  Hypertension Hyperlipidemia Hypothyroidism Atrial fibrillation Morbid obesity Gout Noncompliance  Labs:  BNP 1874, sodium 140, potassium 3.9, chloride 105, CO2 26, BUN 49, creatinine 3.21 Weight is 116.6 kg Blood pressure 126/105  Medications:  Apixaban 5 mg 2 times a day Atorvastatin 80 mg once daily Carvedilol 6.25 mg twice a day Ferrous sulfate 325 daily Furosemide 40 mg IV every 12 Levothyroxine 100 mcg daily   Initial meeting with patient in the ED.  He was lying on a gurney in no acute distress, currently on room air.  Discussed what happened after he was seen in the out outpatient heart failure clinic and was doing so well.  He states that he was not weighing himself daily, also had no idea how much fluids he was drinking daily.  Asked him why he was not weighing himself daily as he has gotten to scales 1 from myself while he was an inpatient in the hospital he got a second 1 when he was last seen at the outpatient heart failure clinic telling him that he did not have a scale.  Replied that the scale was still in the box and that his brother had placed the scale in his mother's room.  Discussed  getting the scale out-of-the-box and out of his mother's room so he can start using it.  Discussed what to report.  Also discussed getting a 64 ounce container that he could fill with tap water daily and then remove that amount every time that he had something to  drink.  Expect poor compliance because the patient really does not seem to be interested in taking care of himself and keeping himself out of the hospital.  Pricilla Riffle RN [facility]

## 2021-12-09 DIAGNOSIS — I5023 Acute on chronic systolic (congestive) heart failure: Secondary | ICD-10-CM | POA: Diagnosis not present

## 2021-12-09 LAB — BASIC METABOLIC PANEL
Anion gap: 12 (ref 5–15)
BUN: 53 mg/dL — ABNORMAL HIGH (ref 6–20)
CO2: 22 mmol/L (ref 22–32)
Calcium: 8.5 mg/dL — ABNORMAL LOW (ref 8.9–10.3)
Chloride: 103 mmol/L (ref 98–111)
Creatinine, Ser: 3.23 mg/dL — ABNORMAL HIGH (ref 0.61–1.24)
GFR, Estimated: 21 mL/min — ABNORMAL LOW (ref >60)
Glucose, Bld: 97 mg/dL (ref 70–99)
Potassium: 3.8 mmol/L (ref 3.5–5.1)
Sodium: 137 mmol/L (ref 135–145)

## 2021-12-09 LAB — GLUCOSE, CAPILLARY
Glucose-Capillary: 173 mg/dL — ABNORMAL HIGH (ref 70–99)
Glucose-Capillary: 148 mg/dL — ABNORMAL HIGH (ref 70–99)

## 2021-12-09 MED ORDER — MELATONIN 5 MG PO TABS
5.0000 mg | ORAL_TABLET | Freq: Once | ORAL | Status: AC
  Administered 2021-12-09: 5 mg via ORAL
  Filled 2021-12-09: qty 1

## 2021-12-09 NOTE — Progress Notes (Signed)
PROGRESS NOTE    Hector [name]  MVE:[id number] DOB: 06-20-1965 DOA: 12/07/2021 PCP: [name], MD    Brief Narrative:  Hector [name] is a 56 y.o. male with medical history significant of hypertension, hyperlipidemia, combined systolic/diastolic CHF, hypothyroidism, atrial fibrillation on Eliquis, CKD stage IV and obesity who presents to the emergency department due to increased shortness of breath, increased leg swelling and abdominal distention which started 4 days ago.  Patient states that he has been compliant with his medications, however, it is unknown if patient has been compliant with diet.  Patient was recently admitted from 9/26 of 10/2 due to acute on chronic systolic CHF which was managed with IV Lasix with greater than 15 L of fluid diuresed and about 7 pounds weight loss with weight being closer to patient's dry weight prior to discharge time  10/17 feeling better, but still sob, not at baseline yet  Consultants:  Cardiology  Procedures:  Cxr Enlarged cardiopericardial silhouette with pulmonary vascular congestion.   Small nodular density at the right base. CT chest without contrast recommended to further evaluate.  CT scan chest . Right lower lobe calcified granulomas. 2. Minimal ground-glass and strandy opacities in the right upper lobe and right middle lobe favored as scarring. 3. Mild cardiomegaly. 4. Bilateral renal cysts. No follow-up imaging recommended.    Antimicrobials:     Subjective: No cp , or dizziness. Good UO  Objective: Vitals:   12/08/21 2253 12/09/21 0112 12/09/21 0513 12/09/21 0736  BP: 120/89  109/87 104/86  Pulse: 96  68 88  Resp: 20  20 18   Temp: 97.9 F (36.6 C)  98.2 F (36.8 C)   TempSrc:      SpO2: 99%  96% 97%  Weight:  116 kg    Height:  5\' 11"  (1.803 m)      Intake/Output Summary (Last 24 hours) at 12/09/2021 0815 Last data filed at 12/09/2021 0600 Gross per 24 hour  Intake --  Output 2600 ml   Net -2600 ml   Filed Weights   12/07/21 1406 12/09/21 0112  Weight: 116.6 kg 116 kg    Examination: Calm, NAD Decreased breath sounds bilaterally no wheezing Reg s1/s2 no gallop Soft benign +bs Decreasing LE edema b/l Aaoxox3  Mood and affect appropriate in current setting     Data Reviewed: I have personally reviewed following labs and imaging studies  CBC: Recent Labs  Lab 12/07/21 1411 12/08/21 0436  WBC 7.6 8.9  HGB 13.6 13.5  HCT 44.6 43.8  MCV 80.8 80.2  PLT 245 283   Basic Metabolic Panel: Recent Labs  Lab 12/07/21 1411 12/08/21 0436 12/09/21 0408  NA 139 140 137  K 4.1 3.9 3.8  CL 108 105 103  CO2 25 26 22   GLUCOSE 116* 106* 97  BUN 49* 49* 53*  CREATININE 3.36* 3.21* 3.23*  CALCIUM 8.6* 8.7* 8.5*  MG  --  2.1  --   PHOS  --  3.8  --    GFR: Estimated Creatinine Clearance: 32.7 mL/min (A) (by C-G formula based on SCr of 3.23 mg/dL (H)). Liver Function Tests: Recent Labs  Lab 12/07/21 1411 12/08/21 0436  AST 15 17  ALT 15 14  ALKPHOS 72 67  BILITOT 1.9* 2.3*  PROT 6.9 6.6  ALBUMIN 3.6 3.4*   Recent Labs  Lab 12/07/21 1411  LIPASE 24   No results for input(s): "AMMONIA" in the last 168 hours. Coagulation Profile: No results for input(s): "INR", "PROTIME" in  the last 168 hours. Cardiac Enzymes: No results for input(s): "CKTOTAL", "CKMB", "CKMBINDEX", "TROPONINI" in the last 168 hours. BNP (last 3 results) No results for input(s): "PROBNP" in the last 8760 hours. HbA1C: No results for input(s): "HGBA1C" in the last 72 hours. CBG: Recent Labs  Lab 12/08/21 0906 12/08/21 1146 12/08/21 1632 12/08/21 2306 12/09/21 0737  GLUCAP 172* 97 124* 125* 173*   Lipid Profile: No results for input(s): "CHOL", "HDL", "LDLCALC", "TRIG", "CHOLHDL", "LDLDIRECT" in the last 72 hours. Thyroid Function Tests: No results for input(s): "TSH", "T4TOTAL", "FREET4", "T3FREE", "THYROIDAB" in the last 72 hours. Anemia Panel: No results for input(s):  "VITAMINB12", "FOLATE", "FERRITIN", "TIBC", "IRON", "RETICCTPCT" in the last 72 hours. Sepsis Labs: No results for input(s): "PROCALCITON", "LATICACIDVEN" in the last 168 hours.  No results found for this or any previous visit (from the past 240 hour(s)).       Radiology Studies: ECHOCARDIOGRAM LIMITED  Result Date: 12/08/2021    ECHOCARDIOGRAM LIMITED REPORT   Patient Name:   Hector [name] Date of Exam: 12/08/2021 Medical Rec #:  [id number]           Height:       71.0 in Accession #:    [id number]          Weight:       257.0 lb Date of Birth:  [birth year]           BSA:          2.346 m Patient Age:    22 years            BP:           130/84 mmHg Patient Gender: M                   HR:           62 bpm. Exam Location:  [facility] Procedure: 2D Echo, Cardiac Doppler and Color Doppler Indications:     Q25.95 CHF Acute systolic  History:         Patient has prior history of Echocardiogram examinations, most                  recent 09/29/2021. Cardiomyopathy and CHF, Arrythmias:Atrial                  Fibrillation and Atrial Flutter; Risk Factors:Hypertension,                  Dyslipidemia, Former Smoker, Diabetes and Sleep Apnea.  Sonographer:     Rosalia Hammers Referring Phys:  [id number] OLADAPO ADEFESO Diagnosing Phys: Kathlyn Sacramento MD  Sonographer Comments: Suboptimal parasternal window. Image acquisition challenging due to uncooperative patient, Image acquisition challenging due to patient body habitus and Image acquisition challenging due to respiratory motion. IMPRESSIONS  1. Left ventricular ejection fraction, by estimation, is <20%. The left ventricle has severely decreased function. The left ventricle has no regional wall motion abnormalities. The left ventricular internal cavity size was mildly to moderately dilated.  2. Right ventricular systolic function is normal. The right ventricular size is normal. There is mildly elevated pulmonary artery systolic pressure.  3. Left atrial size was severely  dilated.  4. Right atrial size was mildly dilated.  5. The mitral valve is normal in structure. Moderate to severe mitral valve regurgitation. No evidence of mitral stenosis.  6. The aortic valve is normal in structure. Aortic valve regurgitation is not visualized. No aortic stenosis is present.  7. Moderately dilated pulmonary  artery.  8. The inferior vena cava is dilated in size with <50% respiratory variability, suggesting right atrial pressure of 15 mmHg. FINDINGS  Left Ventricle: Left ventricular ejection fraction, by estimation, is <20%. The left ventricle has severely decreased function. The left ventricle has no regional wall motion abnormalities. The left ventricular internal cavity size was mildly to moderately dilated. There is no left ventricular hypertrophy. Right Ventricle: The right ventricular size is normal. No increase in right ventricular wall thickness. Right ventricular systolic function is normal. There is mildly elevated pulmonary artery systolic pressure. The tricuspid regurgitant velocity is 2.46  m/s, and with an assumed right atrial pressure of 15 mmHg, the estimated right ventricular systolic pressure is 46.9 mmHg. Left Atrium: Left atrial size was severely dilated. Right Atrium: Right atrial size was mildly dilated. Pericardium: There is no evidence of pericardial effusion. Mitral Valve: The mitral valve is normal in structure. Moderate to severe mitral valve regurgitation. No evidence of mitral valve stenosis. Tricuspid Valve: The tricuspid valve is normal in structure. Tricuspid valve regurgitation is mild . No evidence of tricuspid stenosis. Aortic Valve: The aortic valve is normal in structure. Aortic valve regurgitation is not visualized. No aortic stenosis is present. Aortic valve mean gradient measures 1.0 mmHg. Aortic valve peak gradient measures 1.9 mmHg. Aortic valve area, by VTI measures 4.40 cm. Pulmonic Valve: The pulmonic valve was normal in structure. Pulmonic valve  regurgitation is mild. No evidence of pulmonic stenosis. Aorta: The aortic root is normal in size and structure. Pulmonary Artery: The pulmonary artery is moderately dilated. Venous: The inferior vena cava is dilated in size with less than 50% respiratory variability, suggesting right atrial pressure of 15 mmHg. IAS/Shunts: No atrial level shunt detected by color flow Doppler. LEFT VENTRICLE PLAX 2D LVIDd:         5.90 cm LVIDs:         5.20 cm LV PW:         1.30 cm LV IVS:        1.10 cm LVOT diam:     2.30 cm LV SV:         45 LV SV Index:   19 LVOT Area:     4.15 cm  LV Volumes (MOD) LV vol d, MOD A2C: 185.0 ml LV vol d, MOD A4C: 223.0 ml LV vol s, MOD A2C: 134.0 ml LV vol s, MOD A4C: 170.0 ml LV SV MOD A2C:     51.0 ml LV SV MOD A4C:     223.0 ml LV SV MOD BP:      56.2 ml RIGHT VENTRICLE RV Basal diam:  4.60 cm RV Mid diam:    3.40 cm LEFT ATRIUM              Index        RIGHT ATRIUM           Index LA diam:        4.50 cm  1.92 cm/m   RA Area:     20.00 cm LA Vol (A2C):   115.0 ml 49.03 ml/m  RA Volume:   58.90 ml  25.11 ml/m LA Vol (A4C):   92.9 ml  39.60 ml/m LA Biplane Vol: 110.0 ml 46.89 ml/m  AORTIC VALVE AV Area (Vmax):    4.42 cm AV Area (Vmean):   3.94 cm AV Area (VTI):     4.40 cm AV Vmax:           68.40 cm/s AV Vmean:  46.200 cm/s AV VTI:            0.103 m AV Peak Grad:      1.9 mmHg AV Mean Grad:      1.0 mmHg LVOT Vmax:         72.80 cm/s LVOT Vmean:        43.800 cm/s LVOT VTI:          0.109 m LVOT/AV VTI ratio: 1.06  AORTA Ao Root diam: 3.30 cm MITRAL VALVE                TRICUSPID VALVE MV Area (PHT): 5.31 cm     TR Peak grad:   24.2 mmHg MV Decel Time: 143 msec     TR Vmax:        246.00 cm/s MV A velocity: 127.00 cm/s                             SHUNTS                             Systemic VTI:  0.11 m                             Systemic Diam: 2.30 cm Kathlyn Sacramento MD Electronically signed by Kathlyn Sacramento MD Signature Date/Time: 12/08/2021/2:38:45 PM    Final    CT  CHEST WO CONTRAST  Result Date: 12/07/2021 CLINICAL DATA:  Lung nodule. EXAM: CT CHEST WITHOUT CONTRAST TECHNIQUE: Multidetector CT imaging of the chest was performed following the standard protocol without IV contrast. RADIATION DOSE REDUCTION: This exam was performed according to the departmental dose-optimization program which includes automated exposure control, adjustment of the mA and/or kV according to patient size and/or use of iterative reconstruction technique. COMPARISON:  Chest x-ray 12/07/2021 FINDINGS: Cardiovascular: The heart is mildly enlarged. Aorta is normal in size. There is no pericardial effusion. There are atherosclerotic calcifications of the aorta and coronary arteries. Mediastinum/Nodes: No enlarged mediastinal or axillary lymph nodes. Thyroid gland, trachea, and esophagus demonstrate no significant findings. Lungs/Pleura: There are calcified granulomas in the right lower lobe. There are minimal ground-glass and strandy opacities in the inferior right upper lobe and right middle lobe favored as scarring. No pleural effusion or pneumothorax. Trachea and central airways are patent. Upper Abdomen: Bilateral renal cysts are present. These measure up to 3.8 cm on the left. Musculoskeletal: No chest wall mass or suspicious bone lesions identified. IMPRESSION: 1. Right lower lobe calcified granulomas. 2. Minimal ground-glass and strandy opacities in the right upper lobe and right middle lobe favored as scarring. 3. Mild cardiomegaly. 4. Bilateral renal cysts. No follow-up imaging recommended. Aortic Atherosclerosis (ICD10-I70.0). Electronically Signed   By: Ronney Asters M.D.   On: 12/07/2021 21:08   DG Chest 2 View  Result Date: 12/07/2021 CLINICAL DATA:  Heart failure. EXAM: CHEST - 2 VIEW COMPARISON:  11/18/2021 FINDINGS: 1713 hours. The cardio pericardial silhouette is enlarged. There is pulmonary vascular congestion without overt pulmonary edema. Small nodular density seen lateral  right costophrenic sulcus. No substantial pleural effusion. IMPRESSION: Enlarged cardiopericardial silhouette with pulmonary vascular congestion. Small nodular density at the right base. CT chest without contrast recommended to further evaluate. Electronically Signed   By: Misty Stanley M.D.   On: 12/07/2021 17:27        Scheduled Meds:  allopurinol  50  mg Oral Daily   apixaban  5 mg Oral BID   atorvastatin  80 mg Oral Daily   carvedilol  6.25 mg Oral BID WC   ferrous sulfate  325 mg Oral Q breakfast   furosemide  40 mg Intravenous Q12H   insulin aspart  0-9 Units Subcutaneous TID WC   levothyroxine  100 mcg Oral QAC breakfast   Continuous Infusions:  Assessment & Plan:   Principal Problem:   Acute on chronic systolic (congestive) heart failure (HCC) Active Problems:   Atrial fibrillation with rapid ventricular response (HCC)   Essential hypertension   Mixed hyperlipidemia   Acquired hypothyroidism   Obesity (BMI 30-39.9)   CKD (chronic kidney disease), stage IV (HCC)   Iron deficiency anemia   Gout   Prediabetes   Pulmonary nodule   Acute on chronic systolic CHF BNP 7,619.5 (this was 2089.8 on 11/18/2021) Chest x-ray showed enlarged cardiopericardial silhouette with pulmonary vascular congestion Continue total input/output, daily weights and fluid restriction Continue IV Lasix 40 twice daily, home torsemide will be held at this time Continue heart healthy diet      Echocardiogram done on 09/29/2021 showed LVEF  <20%.  LV and severely decreased function.  No RWMA.  LV diastolic parameters are indeterminate.  RV systolic function mildly decreased.  Echocardiogram will be done in the morning  This is patient's fourth admission for CHF exacerbation since 09/28/2021 10/16 cardiologist input was appreciated.  He is not a candidate for ACE inhibitor/ARB/MR ACE/ARN 2/2 ckd 10/17 clinically improving, but not at baseline.  Continue iv lasix    A-fib with RVR  per cardiology  Patient was advised on last admission he will need outpatient EP consultation but he had to demonstrate compliance with medical therapy 10/17 continue with coreg and eliquis  Incidental finding of pulmonary nodule Small nodular density at the right base.  10/17 CT of chest did not reveal any further recommendations.  Please see full report      Iron deficiency anemia  continue ferrous sulfate   Essential hypertension Continue Lasix, Coreg   Mixed hyperlipidemia Continue Lipitor   Acquired hypothyroidism Continue Synthroid   CKD stage IV BUN/creatinine 49/3.36 (creatinine is within baseline range) Renally adjust medications, avoid nephrotoxic agents/dehydration/hypotension   Prediabetes A1c 7.2 (11/19/2021) Continue ISS and hypoglycemic protocol   Gout Continue allopurinol   Obesity (BMI 35.84) Continue diet and lifestyle modification     DVT prophylaxis: Eliquis Code Status: DNR Family Communication: None at bedside Disposition Plan: Home Status is: Inpatient Remains inpatient appropriate because: IV treatment.  Still in CHF.        LOS: 2 days   Time spent: 35 minutes    Nolberto Hanlon, MD [facility] Pager [phone]  If 7PM-7AM, please contact night-coverage 12/09/2021, 8:15 AM

## 2021-12-09 NOTE — Progress Notes (Signed)
He has been refusing sliding scale insulin and now he is also refusing finger stick. He stated he is not diabetic. Dr. Kurtis Bushman notified.

## 2021-12-09 NOTE — Progress Notes (Signed)
2100-Refused blood sugar stick.

## 2021-12-09 NOTE — TOC Initial Note (Signed)
Transition of Care [facility]) - Initial/Assessment Note    Patient Details  Name: Hector [name] MRN: [id number] Date of Birth: May 15, 1965  [facility]) CM/SW Contact:    Candie Chroman, LCSW Phone Number: 12/09/2021, 1:10 PM  Clinical Narrative:   Readmission prevention screen complete. CSW met with patient. No supports at bedside. CSW introduced role and explained that discharge planning would be discussed. PCP is Rutherford Guys, MD at [facility]. Family/friends transport him to appointments. Patient uses the [employer]. If on the weekend, he uses [facility]. No issues obtaining medications. Patient lives with his brother. No home health or DME use prior to admission. No further concerns. CSW encouraged patient to contact CSW as needed. CSW will continue to follow patient for support and facilitate return home once stable. Brother will likely transport him home at discharge.              Expected Discharge Plan: Home/Self Care Barriers to Discharge: Continued Medical Work up   Patient Goals and CMS Choice     Choice offered to / list presented to : NA  Expected Discharge Plan and Services Expected Discharge Plan: Home/Self Care     Post Acute Care Choice: NA Living arrangements for the past 2 months: Single Family Home                                      Prior Living Arrangements/Services Living arrangements for the past 2 months: Single Family Home Lives with:: Siblings Patient language and need for interpreter reviewed:: Yes Do you feel safe going back to the place where you live?: Yes      Need for Family Participation in Patient Care: Yes (Comment) Care giver support system in place?: Yes (comment)   Criminal Activity/Legal Involvement Pertinent to Current Situation/Hospitalization: No - Comment as needed  Activities of Daily Living Home Assistive Devices/Equipment: Scales, Walker (specify type), Cane (specify quad  or straight) ADL Screening (condition at time of admission) Patient's cognitive ability adequate to safely complete daily activities?: Yes Is the patient deaf or have difficulty hearing?: No Does the patient have difficulty seeing, even when wearing glasses/contacts?: No Does the patient have difficulty concentrating, remembering, or making decisions?: No Patient able to express need for assistance with ADLs?: Yes Does the patient have difficulty dressing or bathing?: No Independently performs ADLs?: Yes (appropriate for developmental age) Does the patient have difficulty walking or climbing stairs?: No Weakness of Legs: None Weakness of Arms/Hands: None  Permission Sought/Granted                  Emotional Assessment Appearance:: Appears stated age Attitude/Demeanor/Rapport: Engaged, Gracious Affect (typically observed): Accepting, Appropriate, Calm Orientation: : Oriented to Self, Oriented to Place, Oriented to  Time, Oriented to Situation Alcohol / Substance Use: Not Applicable Psych Involvement: No (comment)  Admission diagnosis:  Acute exacerbation of CHF (congestive heart failure) (HCC) [I50.9] Acute congestive heart failure, unspecified heart failure type [facility]) [I50.9] Patient Active Problem List   Diagnosis Date Noted   Prediabetes 12/07/2021   Pulmonary nodule 12/07/2021   CHF (congestive heart failure) ([place]) 11/18/2021   Gout 11/18/2021   Atrial fibrillation with rapid ventricular response ([place]) 10/13/2021   Dyslipidemia 10/13/2021   Obesity (BMI 30-39.9) 10/01/2021   Acute on chronic systolic (congestive) heart failure ([place]) 09/28/2021   Right upper quadrant pain 09/10/2021   Nausea and  vomiting 09/10/2021   Atrial flutter with rapid ventricular response ([place]) 03/01/2021   GI bleeding 11/30/2020   Acquired hypothyroidism 11/30/2020   Stroke ([employer])    Alcohol abuse    CKD (chronic kidney disease), stage IV (HCC)    Chronic systolic CHF (congestive heart  failure) ([place])    Iron deficiency anemia    Congestive heart failure (CHF) ([place]) 09/12/2020   CKD (chronic kidney disease) stage 4, GFR 15-29 ml/min ([place]) 07/21/2020   GIB (gastrointestinal bleeding) 07/21/2020   Acute blood loss anemia 07/21/2020   Hyperthyroidism without thyroid nodule 02/03/2020   Acute respiratory distress syndrome (ARDS) due to COVID-19 virus ([place]) 12/31/2019   Acute renal failure superimposed on stage 3b chronic kidney disease ([place]) 12/31/2019   Hyperkalemia 12/31/2019   Hyponatremia 12/31/2019   Acute on chronic systolic CHF (congestive heart failure) ([place]) 07/21/2016   Obstructive sleep apnea 04/21/2016   Nonischemic cardiomyopathy ([place]) 05/26/2015   Chronic combined systolic (congestive) and diastolic (congestive) heart failure ([place]) 04/01/2015   Essential hypertension 04/01/2015   Sinus bradycardia 01/29/2015   Nonischemic cardiomyopathy: EF 20-25% 09/21/2014   Paroxysmal atrial fibrillation ([place]) 08/16/2014   Paroxysmal atrial flutter ([place]) 08/14/2014    Class: Acute   Hypertensive heart disease    Mixed hyperlipidemia    PCP:  [name], MD Pharmacy:   [facility] [address] ([address]), [place] [address] [place] ([place]) [place] [address] Phone: [phone] Fax: [phone]     Social Determinants of Health (SDOH) Interventions    Readmission Risk Interventions    12/09/2021    1:09 PM 10/14/2021    1:24 PM 03/04/2021   10:16 AM  Readmission Risk Prevention Plan  Transportation Screening Complete Complete Complete  PCP or Specialist Appt within 3-5 Days  Complete Complete  HRI or [place]   Complete  Social Work Consult for [place] Planning/Counseling  Complete Complete  Palliative Care Screening  Not Applicable Not Applicable  Medication Review [employer]) Complete Complete Complete  PCP or Specialist appointment within 3-5 days of discharge Complete    SW Recovery  Care/Counseling Consult Complete    [place] Not Applicable

## 2021-12-09 NOTE — Progress Notes (Signed)
Rounding Note    Patient Name: Hector [name] Date of Encounter: 12/09/2021  [place] Cardiologist: Ida Rogue, MD   Subjective   UOP -2.6L. Patient denies chest pain or significant shortness of breath. Afib is rate controlled.   Inpatient Medications    Scheduled Meds:  allopurinol  50 mg Oral Daily   apixaban  5 mg Oral BID   atorvastatin  80 mg Oral Daily   carvedilol  6.25 mg Oral BID WC   ferrous sulfate  325 mg Oral Q breakfast   furosemide  40 mg Intravenous Q12H   insulin aspart  0-9 Units Subcutaneous TID WC   levothyroxine  100 mcg Oral QAC breakfast   Continuous Infusions:  PRN Meds:    Vital Signs    Vitals:   12/09/21 0112 12/09/21 0513 12/09/21 0736 12/09/21 1144  BP:  109/87 104/86 105/79  Pulse:  68 88 (!) 104  Resp:  20 18 (!) 22  Temp:  98.2 F (36.8 C)  98.1 F (36.7 C)  TempSrc:      SpO2:  96% 97% 96%  Weight: 116 kg     Height: 5\' 11"  (1.803 m)       Intake/Output Summary (Last 24 hours) at 12/09/2021 1147 Last data filed at 12/09/2021 1140 Gross per 24 hour  Intake 240 ml  Output 3400 ml  Net -3160 ml      12/09/2021    1:12 AM 12/07/2021    2:06 PM 11/27/2021    2:23 PM  Last 3 Weights  Weight (lbs) 255 lb 11.7 oz 257 lb 250 lb  Weight (kg) 116 kg 116.574 kg 113.399 kg      Telemetry    Afib Hr 80-90s - Personally Reviewed  ECG    No new - Personally Reviewed  Physical Exam   GEN: No acute distress.   Neck: No JVD Cardiac: Irreg IRreg, + murmur, no rubs, or gallops.  Respiratory: diminished breath sounds. GI: Soft, nontender, non-distended  MS: LLE edema; No deformity. Neuro:  Nonfocal  Psych: Normal affect   Labs    High Sensitivity Troponin:   Recent Labs  Lab 11/18/21 1234  TROPONINIHS 52*     Chemistry Recent Labs  Lab 12/07/21 1411 12/08/21 0436 12/09/21 0408  NA 139 140 137  K 4.1 3.9 3.8  CL 108 105 103  CO2 25 26 22   GLUCOSE 116* 106* 97  BUN 49* 49* 53*   CREATININE 3.36* 3.21* 3.23*  CALCIUM 8.6* 8.7* 8.5*  MG  --  2.1  --   PROT 6.9 6.6  --   ALBUMIN 3.6 3.4*  --   AST 15 17  --   ALT 15 14  --   ALKPHOS 72 67  --   BILITOT 1.9* 2.3*  --   GFRNONAA 21* 22* 21*  ANIONGAP 6 9 12     Lipids No results for input(s): "CHOL", "TRIG", "HDL", "LABVLDL", "LDLCALC", "CHOLHDL" in the last 168 hours.  Hematology Recent Labs  Lab 12/07/21 1411 12/08/21 0436  WBC 7.6 8.9  RBC 5.52 5.46  HGB 13.6 13.5  HCT 44.6 43.8  MCV 80.8 80.2  MCH 24.6* 24.7*  MCHC 30.5 30.8  RDW 22.5* 22.5*  PLT 245 239   Thyroid No results for input(s): "TSH", "FREET4" in the last 168 hours.  BNP Recent Labs  Lab 12/07/21 1411  BNP 1,874.7*    DDimer No results for input(s): "DDIMER" in the last 168 hours.  Radiology    ECHOCARDIOGRAM LIMITED  Result Date: 12/08/2021    ECHOCARDIOGRAM LIMITED REPORT   Patient Name:   Hector [name] Date of Exam: 12/08/2021 Medical Rec #:  [id number]           Height:       71.0 in Accession #:    [id number]          Weight:       257.0 lb Date of Birth:  20-Oct-1965           BSA:          2.346 m Patient Age:    56 years            BP:           130/84 mmHg Patient Gender: M                   HR:           62 bpm. Exam Location:  [facility] Procedure: 2D Echo, Cardiac Doppler and Color Doppler Indications:     Z16.96 CHF Acute systolic  History:         Patient has prior history of Echocardiogram examinations, most                  recent 09/29/2021. Cardiomyopathy and CHF, Arrythmias:Atrial                  Fibrillation and Atrial Flutter; Risk Factors:Hypertension,                  Dyslipidemia, Former Smoker, Diabetes and Sleep Apnea.  Sonographer:     Rosalia Hammers Referring Phys:  [id number] OLADAPO ADEFESO Diagnosing Phys: Kathlyn Sacramento MD  Sonographer Comments: Suboptimal parasternal window. Image acquisition challenging due to uncooperative patient, Image acquisition challenging due to patient body habitus and Image acquisition  challenging due to respiratory motion. IMPRESSIONS  1. Left ventricular ejection fraction, by estimation, is <20%. The left ventricle has severely decreased function. The left ventricle has no regional wall motion abnormalities. The left ventricular internal cavity size was mildly to moderately dilated.  2. Right ventricular systolic function is normal. The right ventricular size is normal. There is mildly elevated pulmonary artery systolic pressure.  3. Left atrial size was severely dilated.  4. Right atrial size was mildly dilated.  5. The mitral valve is normal in structure. Moderate to severe mitral valve regurgitation. No evidence of mitral stenosis.  6. The aortic valve is normal in structure. Aortic valve regurgitation is not visualized. No aortic stenosis is present.  7. Moderately dilated pulmonary artery.  8. The inferior vena cava is dilated in size with <50% respiratory variability, suggesting right atrial pressure of 15 mmHg. FINDINGS  Left Ventricle: Left ventricular ejection fraction, by estimation, is <20%. The left ventricle has severely decreased function. The left ventricle has no regional wall motion abnormalities. The left ventricular internal cavity size was mildly to moderately dilated. There is no left ventricular hypertrophy. Right Ventricle: The right ventricular size is normal. No increase in right ventricular wall thickness. Right ventricular systolic function is normal. There is mildly elevated pulmonary artery systolic pressure. The tricuspid regurgitant velocity is 2.46  m/s, and with an assumed right atrial pressure of 15 mmHg, the estimated right ventricular systolic pressure is 17.5 mmHg. Left Atrium: Left atrial size was severely dilated. Right Atrium: Right atrial size was mildly dilated. Pericardium: There is no evidence of pericardial effusion. Mitral Valve: The mitral valve  is normal in structure. Moderate to severe mitral valve regurgitation. No evidence of mitral valve  stenosis. Tricuspid Valve: The tricuspid valve is normal in structure. Tricuspid valve regurgitation is mild . No evidence of tricuspid stenosis. Aortic Valve: The aortic valve is normal in structure. Aortic valve regurgitation is not visualized. No aortic stenosis is present. Aortic valve mean gradient measures 1.0 mmHg. Aortic valve peak gradient measures 1.9 mmHg. Aortic valve area, by VTI measures 4.40 cm. Pulmonic Valve: The pulmonic valve was normal in structure. Pulmonic valve regurgitation is mild. No evidence of pulmonic stenosis. Aorta: The aortic root is normal in size and structure. Pulmonary Artery: The pulmonary artery is moderately dilated. Venous: The inferior vena cava is dilated in size with less than 50% respiratory variability, suggesting right atrial pressure of 15 mmHg. IAS/Shunts: No atrial level shunt detected by color flow Doppler. LEFT VENTRICLE PLAX 2D LVIDd:         5.90 cm LVIDs:         5.20 cm LV PW:         1.30 cm LV IVS:        1.10 cm LVOT diam:     2.30 cm LV SV:         45 LV SV Index:   19 LVOT Area:     4.15 cm  LV Volumes (MOD) LV vol d, MOD A2C: 185.0 ml LV vol d, MOD A4C: 223.0 ml LV vol s, MOD A2C: 134.0 ml LV vol s, MOD A4C: 170.0 ml LV SV MOD A2C:     51.0 ml LV SV MOD A4C:     223.0 ml LV SV MOD BP:      56.2 ml RIGHT VENTRICLE RV Basal diam:  4.60 cm RV Mid diam:    3.40 cm LEFT ATRIUM              Index        RIGHT ATRIUM           Index LA diam:        4.50 cm  1.92 cm/m   RA Area:     20.00 cm LA Vol (A2C):   115.0 ml 49.03 ml/m  RA Volume:   58.90 ml  25.11 ml/m LA Vol (A4C):   92.9 ml  39.60 ml/m LA Biplane Vol: 110.0 ml 46.89 ml/m  AORTIC VALVE AV Area (Vmax):    4.42 cm AV Area (Vmean):   3.94 cm AV Area (VTI):     4.40 cm AV Vmax:           68.40 cm/s AV Vmean:          46.200 cm/s AV VTI:            0.103 m AV Peak Grad:      1.9 mmHg AV Mean Grad:      1.0 mmHg LVOT Vmax:         72.80 cm/s LVOT Vmean:        43.800 cm/s LVOT VTI:          0.109 m  LVOT/AV VTI ratio: 1.06  AORTA Ao Root diam: 3.30 cm MITRAL VALVE                TRICUSPID VALVE MV Area (PHT): 5.31 cm     TR Peak grad:   24.2 mmHg MV Decel Time: 143 msec     TR Vmax:        246.00 cm/s MV A velocity:  127.00 cm/s                             SHUNTS                             Systemic VTI:  0.11 m                             Systemic Diam: 2.30 cm Kathlyn Sacramento MD Electronically signed by Kathlyn Sacramento MD Signature Date/Time: 12/08/2021/2:38:45 PM    Final    CT CHEST WO CONTRAST  Result Date: 12/07/2021 CLINICAL DATA:  Lung nodule. EXAM: CT CHEST WITHOUT CONTRAST TECHNIQUE: Multidetector CT imaging of the chest was performed following the standard protocol without IV contrast. RADIATION DOSE REDUCTION: This exam was performed according to the departmental dose-optimization program which includes automated exposure control, adjustment of the mA and/or kV according to patient size and/or use of iterative reconstruction technique. COMPARISON:  Chest x-ray 12/07/2021 FINDINGS: Cardiovascular: The heart is mildly enlarged. Aorta is normal in size. There is no pericardial effusion. There are atherosclerotic calcifications of the aorta and coronary arteries. Mediastinum/Nodes: No enlarged mediastinal or axillary lymph nodes. Thyroid gland, trachea, and esophagus demonstrate no significant findings. Lungs/Pleura: There are calcified granulomas in the right lower lobe. There are minimal ground-glass and strandy opacities in the inferior right upper lobe and right middle lobe favored as scarring. No pleural effusion or pneumothorax. Trachea and central airways are patent. Upper Abdomen: Bilateral renal cysts are present. These measure up to 3.8 cm on the left. Musculoskeletal: No chest wall mass or suspicious bone lesions identified. IMPRESSION: 1. Right lower lobe calcified granulomas. 2. Minimal ground-glass and strandy opacities in the right upper lobe and right middle lobe favored as scarring.  3. Mild cardiomegaly. 4. Bilateral renal cysts. No follow-up imaging recommended. Aortic Atherosclerosis (ICD10-I70.0). Electronically Signed   By: Ronney Asters M.D.   On: 12/07/2021 21:08   DG Chest 2 View  Result Date: 12/07/2021 CLINICAL DATA:  Heart failure. EXAM: CHEST - 2 VIEW COMPARISON:  11/18/2021 FINDINGS: 1713 hours. The cardio pericardial silhouette is enlarged. There is pulmonary vascular congestion without overt pulmonary edema. Small nodular density seen lateral right costophrenic sulcus. No substantial pleural effusion. IMPRESSION: Enlarged cardiopericardial silhouette with pulmonary vascular congestion. Small nodular density at the right base. CT chest without contrast recommended to further evaluate. Electronically Signed   By: Misty Stanley M.D.   On: 12/07/2021 17:27    Cardiac Studies   TTE 09/29/2021 1. Left ventricular ejection fraction, by estimation, is <20%. The left  ventricle has severely decreased function. The left ventricle has no  regional wall motion abnormalities. The left ventricular internal cavity  size was moderately dilated. Left  ventricular diastolic parameters are indeterminate.   2. Right ventricular systolic function is mildly reduced. The right  ventricular size is normal. Tricuspid regurgitation signal is inadequate  for assessing PA pressure.   3. Left atrial size was moderately dilated.   4. Right atrial size was mildly dilated.   5. The mitral valve is normal in structure. Moderate mitral valve  regurgitation. No evidence of mitral stenosis.   6. The aortic valve is normal in structure. Aortic valve regurgitation is  not visualized. Aortic valve sclerosis/calcification is present, without  any evidence of aortic stenosis.   7. The inferior vena cava is dilated  in size with <50% respiratory  variability, suggesting right atrial pressure of 15 mmHg.   Patient Profile     56 y.o. 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  Assessment & Plan    Acute on chronic combined systolic and diastolic CHF NICM Moderate to severe MR - multiple hospitalizations due to medication non-compliance - BNP 1847 - LVEF less than 20% on most recent echo - limited echo this admission showed LVEF<20%, moderate to severe MR - not a candidate for ACEi/ARB/MRA/ARNI due to CKD - IV lasix 40mg  BID - PTA Farxiga held - conitnue coreg 6/25mg  BID - patient was advised on last admission he would need outpatient EP consultation but he had to demonstrate compliance with medical therapy.  - continue with diuresis, monitor kidney function  Persistent afib/flutter - remains in afib - CHASVASC at least 6 - rates are controlled on Coreg - Continue Eliquis 5mg  BID  CKD stage IV - Scr 3.21, BUN 49 - daily BMET  HTN - BP good, continue current meds  Nonobstructive CAD - no chest pain reported - HS troponin supply demand mismatch given CHF exacerbation - No ASA with Eliquis - Continue BB and statin therapy  OSA - continue CPAP  For questions or updates, please contact [place] Please consult [email or web address] for contact info under        Signed, Carlton Buskey Ninfa Meeker, PA-C  12/09/2021, 11:47 AM

## 2021-12-10 DIAGNOSIS — I5023 Acute on chronic systolic (congestive) heart failure: Secondary | ICD-10-CM | POA: Diagnosis not present

## 2021-12-10 DIAGNOSIS — E119 Type 2 diabetes mellitus without complications: Secondary | ICD-10-CM

## 2021-12-10 DIAGNOSIS — E1122 Type 2 diabetes mellitus with diabetic chronic kidney disease: Secondary | ICD-10-CM

## 2021-12-10 LAB — BASIC METABOLIC PANEL
Anion gap: 10 (ref 5–15)
BUN: 53 mg/dL — ABNORMAL HIGH (ref 6–20)
CO2: 26 mmol/L (ref 22–32)
Calcium: 8.7 mg/dL — ABNORMAL LOW (ref 8.9–10.3)
Chloride: 101 mmol/L (ref 98–111)
Creatinine, Ser: 3.23 mg/dL — ABNORMAL HIGH (ref 0.61–1.24)
GFR, Estimated: 21 mL/min — ABNORMAL LOW (ref >60)
Glucose, Bld: 167 mg/dL — ABNORMAL HIGH (ref 70–99)
Potassium: 4.3 mmol/L (ref 3.5–5.1)
Sodium: 137 mmol/L (ref 135–145)

## 2021-12-10 NOTE — Assessment & Plan Note (Signed)
A1c of 7.2 on 11/19/2021 which makes him diabetic -Continue with SSI -Unable to do Farxiga due to poor renal function. -Will need low-dose insulin on discharge

## 2021-12-10 NOTE — Assessment & Plan Note (Signed)
Continue Synthroid °

## 2021-12-10 NOTE — Assessment & Plan Note (Signed)
BNP 1,874.7(this was 2089.8 on 11/18/2021) Chest x-ray showed enlarged cardiopericardial silhouette with pulmonary vascular congestion. Echocardiogramdone on 09/29/2021 showed LVEF<20%. LV and severely decreased function. No RWMA. LV diastolic parameters are indeterminate. RV systolic function mildly decreased. Repeat echocardiogram on 10/16 with similar findings and dilated IVC. Cardiology was consulted and patient is being diuresed with IV diuresis. Not a candidate for ACE inhibitor/ARB/MR ACE/ARN 2/2 ckd. -Continue with IV diuresis -Daily BMP and with -Strict intake and output -Ongoing dietary education

## 2021-12-10 NOTE — Progress Notes (Signed)
Patient found to be eating potato chips, re-educated on the importance of low sodium diet with diagnosis and examples given on better snack options. Responded with, " I don't eat that much salt". Poor teach back response after education and information given.  Needs reinforcement and additional education on restrictions.

## 2021-12-10 NOTE — Progress Notes (Signed)
Rounding Note    Patient Name: Hector [name] Date of Encounter: 12/10/2021  [place] Cardiologist: Ida Rogue, MD   Subjective   Shortness of breath improving, edema also better.  No acute events overnight.  Inpatient Medications    Scheduled Meds:  allopurinol  50 mg Oral Daily   apixaban  5 mg Oral BID   atorvastatin  80 mg Oral Daily   carvedilol  6.25 mg Oral BID WC   ferrous sulfate  325 mg Oral Q breakfast   furosemide  40 mg Intravenous Q12H   insulin aspart  0-9 Units Subcutaneous TID WC   levothyroxine  100 mcg Oral QAC breakfast   Continuous Infusions:  PRN Meds:    Vital Signs    Vitals:   12/10/21 0500 12/10/21 0743 12/10/21 0900 12/10/21 1140  BP:  117/89  (!) 117/94  Pulse:  (!) 113  84  Resp:  18 19 20   Temp:  99.1 F (37.3 C)  99.1 F (37.3 C)  TempSrc:  Oral    SpO2:  97%  99%  Weight: 113.8 kg     Height:        Intake/Output Summary (Last 24 hours) at 12/10/2021 1340 Last data filed at 12/10/2021 1141 Gross per 24 hour  Intake 480 ml  Output 3700 ml  Net -3220 ml      12/10/2021    5:00 AM 12/09/2021    1:12 AM 12/07/2021    2:06 PM  Last 3 Weights  Weight (lbs) 250 lb 14.4 oz 255 lb 11.7 oz 257 lb  Weight (kg) 113.807 kg 116 kg 116.574 kg      Telemetry    Atrial fibrillation, heart rate 107- Personally Reviewed  ECG     - Personally Reviewed  Physical Exam   GEN: No acute distress.   Neck: No JVD Cardiac: Irregular irregular Respiratory: Diminished breath sounds at bases bilaterally GI: Soft, nontender, non-distended  MS: 1+ edema; No deformity. Neuro:  Nonfocal  Psych: Normal affect   Labs    High Sensitivity Troponin:   Recent Labs  Lab 11/18/21 1234  TROPONINIHS 52*     Chemistry Recent Labs  Lab 12/07/21 1411 12/08/21 0436 12/09/21 0408 12/10/21 0911  NA 139 140 137 137  K 4.1 3.9 3.8 4.3  CL 108 105 103 101  CO2 25 26 22 26   GLUCOSE 116* 106* 97 167*  BUN 49* 49*  53* 53*  CREATININE 3.36* 3.21* 3.23* 3.23*  CALCIUM 8.6* 8.7* 8.5* 8.7*  MG  --  2.1  --   --   PROT 6.9 6.6  --   --   ALBUMIN 3.6 3.4*  --   --   AST 15 17  --   --   ALT 15 14  --   --   ALKPHOS 72 67  --   --   BILITOT 1.9* 2.3*  --   --   GFRNONAA 21* 22* 21* 21*  ANIONGAP 6 9 12 10     Lipids No results for input(s): "CHOL", "TRIG", "HDL", "LABVLDL", "LDLCALC", "CHOLHDL" in the last 168 hours.  Hematology Recent Labs  Lab 12/07/21 1411 12/08/21 0436  WBC 7.6 8.9  RBC 5.52 5.46  HGB 13.6 13.5  HCT 44.6 43.8  MCV 80.8 80.2  MCH 24.6* 24.7*  MCHC 30.5 30.8  RDW 22.5* 22.5*  PLT 245 239   Thyroid No results for input(s): "TSH", "FREET4" in the last 168 hours.  BNP Recent  Labs  Lab 12/07/21 1411  BNP 1,874.7*    DDimer No results for input(s): "DDIMER" in the last 168 hours.   Radiology    No results found.  Cardiac Studies   TTE 11/2021 1. Left ventricular ejection fraction, by estimation, is <20%. The left  ventricle has severely decreased function. The left ventricle has no  regional wall motion abnormalities. The left ventricular internal cavity  size was mildly to moderately dilated.   2. Right ventricular systolic function is normal. The right ventricular  size is normal. There is mildly elevated pulmonary artery systolic  pressure.   3. Left atrial size was severely dilated.   4. Right atrial size was mildly dilated.   5. The mitral valve is normal in structure. Moderate to severe mitral  valve regurgitation. No evidence of mitral stenosis.   6. The aortic valve is normal in structure. Aortic valve regurgitation is  not visualized. No aortic stenosis is present.   7. Moderately dilated pulmonary artery.   8. The inferior vena cava is dilated in size with <50% respiratory  variability, suggesting right atrial pressure of 15 mmHg.   Patient Profile     56 y.o. male with history of nonischemic cardiomyopathy EF less than 20%, hypertension,  paroxysmal atrial fibrillation, CKD 4 presenting with shortness of breath and leg edema in the context of medication noncompliance.  Being seen for CHF exacerbation  Assessment & Plan    NICM, CHF exacerbation -Last EF less than 20% -Volume overloaded, symptoms improving with diuresing. -Net -2.4 L over the past 24 hours -Continue IV Lasix 40 mg twice daily -Continue Coreg.  Holding ACE/ARB/ARNI due to renal dysfunction -Likely ready for oral diuretics and possible DC in 1 to 2 days.  2.  Paroxysmal atrial fibrillation -Heart rate reasonable, 103 -Continue Coreg, Eliquis  Overall patient is approaching euvolemia, medication compliance is of essence to prevent hospital readmissions.  Total encounter time more than 50 minutes  Greater than 50% was spent in counseling and coordination of care with the patient    Signed, Kate Sable, MD  12/10/2021, 1:40 PM

## 2021-12-10 NOTE — Assessment & Plan Note (Signed)
Creatinine currently stable around baseline. -Monitor renal function while he is being diuresed -Avoid nephrotoxins

## 2021-12-10 NOTE — Assessment & Plan Note (Signed)
-  Continue Lipitor °

## 2021-12-10 NOTE — Progress Notes (Addendum)
Progress Note   Patient: Hector [name] OHY:[id number] DOB: May 31, 1965 DOA: 12/07/2021     3 DOS: the patient was seen and examined on 12/10/2021   Brief hospital course: Taken from prior notes.  Hector [name] is a 56 y.o. male with medical history significant of hypertension, hyperlipidemia, combined systolic/diastolic CHF, hypothyroidism, atrial fibrillation on Eliquis, CKD stage IV and obesity who presents to the emergency department due to increased shortness of breath, increased leg swelling and abdominal distention which started 4 days ago.  Patient states that he has been compliant with his medications, however, it is unknown if patient has been compliant with diet.   Patient was recently admitted from 9/26 of 10/2 due to acute on chronic systolic CHF which was managed with IV Lasix with greater than 15 L of fluid diuresed and about 7 pounds weight loss with weight being closer to patient's dry weight prior to discharge time.  Chest x-ray with pulmonary vascular congestion on admission. There was a concerning small nodular density at right base and CT chest was recommended. CT scan chest 1.Right lower lobe calcified granulomas. 2. Minimal ground-glass and strandy opacities in the right upper lobe and right middle lobe favored as scarring. 3. Mild cardiomegaly. 4. Bilateral renal cysts. No follow-up imaging recommended.  10/18: Patient was started on IV diuresis.  He was found to have Lays chips at bedside, appears to have very low input about salt intake, when inquired stating that he is compliant with his low-salt diet??  Cardiology would like to continue with IV diuresis for 1-2 more days.  Renal functions currently stable  Patient is high risk for decompensation and mortality based on underlying comorbidities and severely depressed ejection fraction.   Assessment and Plan: * Acute on chronic systolic (congestive) heart failure (HCC) BNP 1,874.7 (this was 2089.8 on  11/18/2021) Chest x-ray showed enlarged cardiopericardial silhouette with pulmonary vascular congestion. Echocardiogram done on 09/29/2021 showed LVEF  <20%.  LV and severely decreased function.  No RWMA.  LV diastolic parameters are indeterminate.  RV systolic function mildly decreased. Repeat echocardiogram on 10/16 with similar findings and dilated IVC. Cardiology was consulted and patient is being diuresed with IV diuresis. Not a candidate for ACE inhibitor/ARB/MR ACE/ARN 2/2 ckd. -Continue with IV diuresis -Daily BMP and with -Strict intake and output -Ongoing dietary education  Atrial fibrillation with rapid ventricular response (HCC) Currently rate controlled, intermittently going in RVR.  Cardiology is recommending outpatient EP evaluation.  Patient has an history of paroxysmal atrial fibrillation/flutter -Continue with Coreg and Eliquis  CKD (chronic kidney disease), stage IV (HCC) Creatinine currently stable around baseline. -Monitor renal function while he is being diuresed -Avoid nephrotoxins  Essential hypertension Blood pressure currently within goal. -Continue IV Lasix and Coreg  Mixed hyperlipidemia - Continue Lipitor  Acquired hypothyroidism - Continue Synthroid  Type 2 diabetes mellitus (HCC) A1c of 7.2 on 11/19/2021 which makes him diabetic -Continue with SSI -Unable to do Farxiga due to poor renal function. -Will need low-dose insulin on discharge  Obesity (BMI 30-39.9) Estimated body mass index is 34.99 kg/m as calculated from the following:   Height as of this encounter: 5\' 11"  (1.803 m).   Weight as of this encounter: 113.8 kg.   -This will complicate overall prognosis -Counseling was provided  Iron deficiency anemia Most likely some element of anemia of chronic disease with CKD. - Continue home iron supplement  Gout - Continue home allopurinol  Pulmonary nodule Incidental finding on chest x-ray which prompted CT  chest-please see the full  report. -No further recommendations -Outpatient pulmonary evaluation   Subjective: Patient was seen and examined today.  Continued to have mild dyspnea on exertion.  Physical Exam: Vitals:   12/10/21 0500 12/10/21 0743 12/10/21 0900 12/10/21 1140  BP:  117/89  (!) 117/94  Pulse:  (!) 113  84  Resp:  18 19 20   Temp:  99.1 F (37.3 C)  99.1 F (37.3 C)  TempSrc:  Oral    SpO2:  97%  99%  Weight: 113.8 kg     Height:       General.  Obese gentleman, in no acute distress. Pulmonary.  Lungs clear bilaterally, normal respiratory effort. CV.  Regular rate and rhythm, no JVD, rub or murmur. Abdomen.  Soft, nontender, nondistended, BS positive. CNS.  Alert and oriented .  No focal neurologic deficit. Extremities.  No edema, no cyanosis, pulses intact and symmetrical. Psychiatry.  Judgment and insight appears normal.  Data Reviewed: Prior data reviewed  Family Communication: Discussed with patient  Disposition: Status is: Inpatient Remains inpatient appropriate because: Severity of illness   Planned Discharge Destination: Home  DVT prophylaxis.  Eliquis Time spent: 45 minutes  This record has been created using Systems analyst. Errors have been sought and corrected,but may not always be located. Such creation errors do not reflect on the standard of care.  Author: Lorella Nimrod, MD 12/10/2021 3:10 PM  For on call review [email or web address].

## 2021-12-10 NOTE — Hospital Course (Addendum)
Taken from prior notes.  Hector [name] is a 56 y.o. male with medical history significant of hypertension, hyperlipidemia, combined systolic/diastolic CHF, hypothyroidism, atrial fibrillation on Eliquis, CKD stage IV and obesity who presents to the emergency department due to increased shortness of breath, increased leg swelling and abdominal distention which started 4 days ago.  Patient states that he has been compliant with his medications, however, it is unknown if patient has been compliant with diet.   Patient was recently admitted from 9/26 of 10/2 due to acute on chronic systolic CHF which was managed with IV Lasix with greater than 15 L of fluid diuresed and about 7 pounds weight loss with weight being closer to patient's dry weight prior to discharge time.  Chest x-ray with pulmonary vascular congestion on admission. There was a concerning small nodular density at right base and CT chest was recommended. CT scan chest 1.Right lower lobe calcified granulomas. 2. Minimal ground-glass and strandy opacities in the right upper lobe and right middle lobe favored as scarring. 3. Mild cardiomegaly. 4. Bilateral renal cysts. No follow-up imaging recommended.  10/18: Patient was started on IV diuresis.  He was found to have Lays chips at bedside, appears to have very low input about salt intake, when inquired stating that he is compliant with his low-salt diet??  Cardiology would like to continue with IV diuresis for 1-2 more days.  Renal functions currently stable.  10/19: Patient continued to have good diuresis with net negative of 8372 mL.  Weight decreased to 247 today, total loss of 10 pounds.  Renal functions seems improving with diuresis. Cardiology is recommending 1 more day of IV diuresis and then transition to torsemide 40 mg twice daily from tomorrow before discharge.  10/20: Patient remained stable.  Renal functions stable with slight improvement with diuresis.  Clinically  appears euvolemic.  Patient is being discharged on torsemide 40 mg twice daily, he was prescribed torsemide as twice daily by his cardiologist before but he was just taking it daily.  He will continue with the rest of his current medications and need to have a close follow-up with his providers.  Patient is high risk for decompensation and mortality based on underlying comorbidities and severely depressed ejection fraction.

## 2021-12-10 NOTE — Assessment & Plan Note (Signed)
Blood pressure currently within goal. -Continue IV Lasix and Coreg

## 2021-12-10 NOTE — Assessment & Plan Note (Signed)
Continue home allopurinol 

## 2021-12-10 NOTE — Assessment & Plan Note (Signed)
Incidental finding on chest x-ray which prompted CT chest-please see the full report. -No further recommendations -Outpatient pulmonary evaluation

## 2021-12-10 NOTE — Assessment & Plan Note (Signed)
Most likely some element of anemia of chronic disease with CKD. - Continue home iron supplement

## 2021-12-10 NOTE — Assessment & Plan Note (Signed)
Estimated body mass index is 34.99 kg/m as calculated from the following:   Height as of this encounter: 5\' 11"  (1.803 m).   Weight as of this encounter: 113.8 kg.   -This will complicate overall prognosis -Counseling was provided

## 2021-12-10 NOTE — Assessment & Plan Note (Addendum)
Currently rate controlled, intermittently going in RVR.  Cardiology is recommending outpatient EP evaluation.  Patient has an history of paroxysmal atrial fibrillation/flutter -Continue with Coreg and Eliquis

## 2021-12-11 DIAGNOSIS — I10 Essential (primary) hypertension: Secondary | ICD-10-CM | POA: Diagnosis not present

## 2021-12-11 DIAGNOSIS — E1122 Type 2 diabetes mellitus with diabetic chronic kidney disease: Secondary | ICD-10-CM

## 2021-12-11 DIAGNOSIS — N184 Chronic kidney disease, stage 4 (severe): Secondary | ICD-10-CM | POA: Diagnosis not present

## 2021-12-11 DIAGNOSIS — I5023 Acute on chronic systolic (congestive) heart failure: Secondary | ICD-10-CM | POA: Diagnosis not present

## 2021-12-11 DIAGNOSIS — I4891 Unspecified atrial fibrillation: Secondary | ICD-10-CM | POA: Diagnosis not present

## 2021-12-11 LAB — BASIC METABOLIC PANEL
Anion gap: 10 (ref 5–15)
BUN: 49 mg/dL — ABNORMAL HIGH (ref 6–20)
CO2: 26 mmol/L (ref 22–32)
Calcium: 8.4 mg/dL — ABNORMAL LOW (ref 8.9–10.3)
Chloride: 99 mmol/L (ref 98–111)
Creatinine, Ser: 2.98 mg/dL — ABNORMAL HIGH (ref 0.61–1.24)
GFR, Estimated: 24 mL/min — ABNORMAL LOW (ref >60)
Glucose, Bld: 101 mg/dL — ABNORMAL HIGH (ref 70–99)
Potassium: 3.7 mmol/L (ref 3.5–5.1)
Sodium: 135 mmol/L (ref 135–145)

## 2021-12-11 NOTE — Assessment & Plan Note (Signed)
Creatinine currently stable around baseline, some improvement with diuresis. -Monitor renal function while he is being diuresed -Avoid nephrotoxins

## 2021-12-11 NOTE — Progress Notes (Signed)
Rounding Note    Patient Name: Hector [name] Date of Encounter: 12/11/2021  [place] Cardiologist: Ida Rogue, MD   Subjective   Kidney function improving.UOP -3.4L. Patient denies chest pain or shortness of breath.   Inpatient Medications    Scheduled Meds:  allopurinol  50 mg Oral Daily   apixaban  5 mg Oral BID   atorvastatin  80 mg Oral Daily   carvedilol  6.25 mg Oral BID WC   ferrous sulfate  325 mg Oral Q breakfast   furosemide  40 mg Intravenous Q12H   insulin aspart  0-9 Units Subcutaneous TID WC   levothyroxine  100 mcg Oral QAC breakfast   Continuous Infusions:  PRN Meds:    Vital Signs    Vitals:   12/11/21 0438 12/11/21 0441 12/11/21 0500 12/11/21 0700  BP: (!) 112/101 (!) 119/98  111/77  Pulse: 81   99  Resp:    (!) 21  Temp: 99 F (37.2 C)   98.7 F (37.1 C)  TempSrc: Oral   Oral  SpO2: 97% 98%  95%  Weight:   112.3 kg   Height:        Intake/Output Summary (Last 24 hours) at 12/11/2021 0748 Last data filed at 12/11/2021 0600 Gross per 24 hour  Intake --  Output 2725 ml  Net -2725 ml      12/11/2021    5:00 AM 12/10/2021    5:00 AM 12/09/2021    1:12 AM  Last 3 Weights  Weight (lbs) 247 lb 9.2 oz 250 lb 14.4 oz 255 lb 11.7 oz  Weight (kg) 112.3 kg 113.807 kg 116 kg      Telemetry    Afib HR 100-110 - Personally Reviewed  ECG    No new - Personally Reviewed  Physical Exam   GEN: No acute distress.   Neck: No JVD Cardiac: Irreg Irreg, tachy, no murmurs, rubs, or gallops.  Respiratory: Clear to auscultation bilaterally. GI: Soft, nontender, non-distended  MS: No edema; No deformity. Neuro:  Nonfocal  Psych: Normal affect   Labs    High Sensitivity Troponin:   Recent Labs  Lab 11/18/21 1234  TROPONINIHS 52*     Chemistry Recent Labs  Lab 12/07/21 1411 12/08/21 0436 12/09/21 0408 12/10/21 0911 12/11/21 0347  NA 139 140 137 137 135  K 4.1 3.9 3.8 4.3 3.7  CL 108 105 103 101 99  CO2  25 26 22 26 26   GLUCOSE 116* 106* 97 167* 101*  BUN 49* 49* 53* 53* 49*  CREATININE 3.36* 3.21* 3.23* 3.23* 2.98*  CALCIUM 8.6* 8.7* 8.5* 8.7* 8.4*  MG  --  2.1  --   --   --   PROT 6.9 6.6  --   --   --   ALBUMIN 3.6 3.4*  --   --   --   AST 15 17  --   --   --   ALT 15 14  --   --   --   ALKPHOS 72 67  --   --   --   BILITOT 1.9* 2.3*  --   --   --   GFRNONAA 21* 22* 21* 21* 24*  ANIONGAP 6 9 12 10 10     Lipids No results for input(s): "CHOL", "TRIG", "HDL", "LABVLDL", "LDLCALC", "CHOLHDL" in the last 168 hours.  Hematology Recent Labs  Lab 12/07/21 1411 12/08/21 0436  WBC 7.6 8.9  RBC 5.52 5.46  HGB 13.6 13.5  HCT 44.6 43.8  MCV 80.8 80.2  MCH 24.6* 24.7*  MCHC 30.5 30.8  RDW 22.5* 22.5*  PLT 245 239   Thyroid No results for input(s): "TSH", "FREET4" in the last 168 hours.  BNP Recent Labs  Lab 12/07/21 1411  BNP 1,874.7*    DDimer No results for input(s): "DDIMER" in the last 168 hours.   Radiology    No results found.  Cardiac Studies   TTE 09/29/2021 1. Left ventricular ejection fraction, by estimation, is <20%. The left  ventricle has severely decreased function. The left ventricle has no  regional wall motion abnormalities. The left ventricular internal cavity  size was moderately dilated. Left  ventricular diastolic parameters are indeterminate.   2. Right ventricular systolic function is mildly reduced. The right  ventricular size is normal. Tricuspid regurgitation signal is inadequate  for assessing PA pressure.   3. Left atrial size was moderately dilated.   4. Right atrial size was mildly dilated.   5. The mitral valve is normal in structure. Moderate mitral valve  regurgitation. No evidence of mitral stenosis.   6. The aortic valve is normal in structure. Aortic valve regurgitation is  not visualized. Aortic valve sclerosis/calcification is present, without  any evidence of aortic stenosis.   7. The inferior vena cava is dilated in size with  <50% respiratory  variability, suggesting right atrial pressure of 15 mmHg.   Patient Profile     56 y.o. male with history of nonischemic cardiomyopathy EF less than 20%, hypertension, paroxysmal atrial fibrillation, CKD 4 presenting with shortness of breath and leg edema in the context of medication noncompliance being seen for CHF exacerbation  Assessment & Plan    NICM Acute systolic and diastolic heart failure - multiple hospitalizations due to medication non-compliance - Last EF less than 20% - BNP 1874 - IV lasix 40mg  BID - Net -8.3 - continue Coreg - kidney function improving - No ACE/ARB/ARNI/SGLT2i/spiro due to renal dysfunction - patient was advised on last admission he would need outpatient EP consultation but he had to demonstrate compliance with medical therapy - does not appear significantly volume up on exam, can possibly switch to PTA Torsemide. Prior dose was torsemide 40mg  BID.  Paroxysmal Afib - relatively controlled afib - continue Coreg  - continue Eliquis 5mg  BID  CKD stage IV - Scr 3.21, BUN 49 on admission - improving with diuresis - daily BMET    For questions or updates, please contact [place] Please consult [email or web address] for contact info under        Signed, Cloud Graham Ninfa Meeker, PA-C  12/11/2021, 7:48 AM

## 2021-12-11 NOTE — Progress Notes (Signed)
Progress Note   Patient: Hector [name]:[id number] DOB: 02-Aug-1965 DOA: 12/07/2021     4 DOS: the patient was seen and examined on 12/11/2021   Brief hospital course: Taken from prior notes.  Hector [name] is a 56 y.o. male with medical history significant of hypertension, hyperlipidemia, combined systolic/diastolic CHF, hypothyroidism, atrial fibrillation on Eliquis, CKD stage IV and obesity who presents to the emergency department due to increased shortness of breath, increased leg swelling and abdominal distention which started 4 days ago.  Patient states that he has been compliant with his medications, however, it is unknown if patient has been compliant with diet.   Patient was recently admitted from 9/26 of 10/2 due to acute on chronic systolic CHF which was managed with IV Lasix with greater than 15 L of fluid diuresed and about 7 pounds weight loss with weight being closer to patient's dry weight prior to discharge time.  Chest x-ray with pulmonary vascular congestion on admission. There was a concerning small nodular density at right base and CT chest was recommended. CT scan chest 1.Right lower lobe calcified granulomas. 2. Minimal ground-glass and strandy opacities in the right upper lobe and right middle lobe favored as scarring. 3. Mild cardiomegaly. 4. Bilateral renal cysts. No follow-up imaging recommended.  10/18: Patient was started on IV diuresis.  He was found to have Lays chips at bedside, appears to have very low input about salt intake, when inquired stating that he is compliant with his low-salt diet??  Cardiology would like to continue with IV diuresis for 1-2 more days.  Renal functions currently stable.  10/19: Patient continued to have good diuresis with net negative of 8372 mL.  Weight decreased to 247 today, total loss of 10 pounds.  Renal functions seems improving with diuresis. Cardiology is recommending 1 more day of IV diuresis and then  transition to torsemide 40 mg twice daily from tomorrow before discharge.  Patient is high risk for decompensation and mortality based on underlying comorbidities and severely depressed ejection fraction.   Assessment and Plan: * Acute on chronic systolic (congestive) heart failure (HCC) BNP 1,874.7 (this was 2089.8 on 11/18/2021) Chest x-ray showed enlarged cardiopericardial silhouette with pulmonary vascular congestion. Echocardiogram done on 09/29/2021 showed LVEF  <20%.  LV and severely decreased function.  No RWMA.  LV diastolic parameters are indeterminate.  RV systolic function mildly decreased. Repeat echocardiogram on 10/16 with similar findings and dilated IVC. Cardiology was consulted and patient is being diuresed with IV diuresis. Not a candidate for ACE inhibitor/ARB/MR ACE/ARN 2/2 ckd. -Continue with IV diuresis-most likely will be transitioned to p.o. from tomorrow. -Daily BMP and with -Strict intake and output -Ongoing dietary education  Atrial fibrillation with rapid ventricular response (HCC) Currently rate controlled, intermittently going in RVR.  Cardiology is recommending outpatient EP evaluation.  Patient has an history of paroxysmal atrial fibrillation/flutter -Continue with Coreg and Eliquis  CKD (chronic kidney disease), stage IV (HCC) Creatinine currently stable around baseline, some improvement with diuresis. -Monitor renal function while he is being diuresed -Avoid nephrotoxins  Essential hypertension Blood pressure currently within goal. -Continue IV Lasix and Coreg  Mixed hyperlipidemia - Continue Lipitor  Acquired hypothyroidism - Continue Synthroid  Type 2 diabetes mellitus (HCC) A1c of 7.2 on 11/19/2021 which makes him diabetic -Continue with SSI -Unable to do Farxiga due to poor renal function. -Will need low-dose insulin on discharge  Obesity (BMI 30-39.9) Estimated body mass index is 34.99 kg/m as calculated from the following:  Height as  of this encounter: 5\' 11"  (1.803 m).   Weight as of this encounter: 113.8 kg.   -This will complicate overall prognosis -Counseling was provided  Iron deficiency anemia Most likely some element of anemia of chronic disease with CKD. - Continue home iron supplement  Gout - Continue home allopurinol  Pulmonary nodule Incidental finding on chest x-ray which prompted CT chest-please see the full report. -No further recommendations -Outpatient pulmonary evaluation   Subjective: Patient was seen and examined today.  No new complaints.  He was hoping to go home tomorrow.  Physical Exam: Vitals:   12/11/21 0441 12/11/21 0500 12/11/21 0700 12/11/21 1126  BP: (!) 119/98  111/77 105/78  Pulse:   99 88  Resp:   (!) 21 (!) 22  Temp:   98.7 F (37.1 C) 98.3 F (36.8 C)  TempSrc:   Oral Oral  SpO2: 98%  95% 93%  Weight:  112.3 kg    Height:       General.  Obese gentleman, in no acute distress. Pulmonary.  Lungs clear bilaterally, normal respiratory effort. CV.  Regular rate and rhythm, no JVD, rub or murmur. Abdomen.  Soft, nontender, nondistended, BS positive. CNS.  Alert and oriented .  No focal neurologic deficit. Extremities.  No edema, no cyanosis, pulses intact and symmetrical. Psychiatry.  Judgment and insight appears normal.   Data Reviewed: Prior data reviewed  Family Communication: Discussed with patient  Disposition: Status is: Inpatient Remains inpatient appropriate because: Severity of illness   Planned Discharge Destination: Home  DVT prophylaxis.  Eliquis Time spent: 43 minutes  This record has been created using Systems analyst. Errors have been sought and corrected,but may not always be located. Such creation errors do not reflect on the standard of care.  Author: Lorella Nimrod, MD 12/11/2021 3:26 PM  For on call review [email or web address].

## 2021-12-11 NOTE — Assessment & Plan Note (Signed)
BNP 1,874.7(this was 2089.8 on 11/18/2021) Chest x-ray showed enlarged cardiopericardial silhouette with pulmonary vascular congestion. Echocardiogramdone on 09/29/2021 showed LVEF<20%. LV and severely decreased function. No RWMA. LV diastolic parameters are indeterminate. RV systolic function mildly decreased. Repeat echocardiogram on 10/16 with similar findings and dilated IVC. Cardiology was consulted and patient is being diuresed with IV diuresis. Not a candidate for ACE inhibitor/ARB/MR ACE/ARN 2/2 ckd. -Continue with IV diuresis-most likely will be transitioned to p.o. from tomorrow. -Daily BMP and with -Strict intake and output -Ongoing dietary education

## 2021-12-12 LAB — BASIC METABOLIC PANEL
Anion gap: 10 (ref 5–15)
BUN: 51 mg/dL — ABNORMAL HIGH (ref 6–20)
CO2: 25 mmol/L (ref 22–32)
Calcium: 8.7 mg/dL — ABNORMAL LOW (ref 8.9–10.3)
Chloride: 102 mmol/L (ref 98–111)
Creatinine, Ser: 2.81 mg/dL — ABNORMAL HIGH (ref 0.61–1.24)
GFR, Estimated: 25 mL/min — ABNORMAL LOW (ref >60)
Glucose, Bld: 143 mg/dL — ABNORMAL HIGH (ref 70–99)
Potassium: 4.2 mmol/L (ref 3.5–5.1)
Sodium: 137 mmol/L (ref 135–145)

## 2021-12-12 LAB — GLUCOSE, CAPILLARY
Glucose-Capillary: 133 mg/dL — ABNORMAL HIGH (ref 70–99)
Glucose-Capillary: 123 mg/dL — ABNORMAL HIGH (ref 70–99)

## 2021-12-12 MED ORDER — FERROUS SULFATE 325 (65 FE) MG PO TABS: 325.0000 mg | ORAL_TABLET | Freq: Every day | ORAL | 3 refills | Status: DC

## 2021-12-12 NOTE — TOC Transition Note (Signed)
Transition of Care [facility]) - CM/SW Discharge Note   Patient Details  Name: Hector [name] MRN: [id number] Date of Birth: 1965/03/05  [facility] CM/SW Contact:  Candie Chroman, LCSW Phone Number: 12/12/2021, 1:04 PM   Clinical Narrative:  Patient has orders to discharge home today. No further concerns. CSW signing off.   Final next level of care: Home/Self Care Barriers to Discharge: Barriers Resolved   Patient Goals and CMS Choice     Choice offered to / list presented to : NA  Discharge Placement                Patient to be transferred to facility by: Brother   Patient and family notified of of transfer: 12/12/21  Discharge Plan and Services     Post Acute Care Choice: NA                               Social Determinants of Health (SDOH) Interventions     Readmission Risk Interventions    12/09/2021    1:09 PM 10/14/2021    1:24 PM 03/04/2021   10:16 AM  Readmission Risk Prevention Plan  Transportation Screening Complete Complete Complete  PCP or Specialist Appt within 3-5 Days  Complete Complete  [place]RI or [place]   Complete  Social Work Consult for [place] Planning/Counseling  Complete Complete  Palliative Care Screening  Not Applicable Not Applicable  Medication Review [employer]) Complete Complete Complete  PCP or Specialist appointment within 3-5 days of discharge Complete    SW Recovery Care/Counseling Consult Complete    [place] Not Applicable

## 2021-12-12 NOTE — Discharge Summary (Signed)
Physician Discharge Summary   Patient: Hector [name] MRN: [id number] DOB: 1966/02/06  Admit date:     12/07/2021  Discharge date: 12/12/21  Discharge Physician: Lorella Nimrod   PCP: [name], MD   Recommendations at discharge:  Please obtain CBC and BMP in 1 week Follow-up with cardiology within a week Follow-up with primary care provider  Discharge Diagnoses: Principal Problem:   Acute on chronic systolic (congestive) heart failure ([place]) Active Problems:   Atrial fibrillation with rapid ventricular response ([place])   CKD (chronic kidney disease), stage IV ([place])   Essential hypertension   Mixed hyperlipidemia   Acquired hypothyroidism   Type 2 diabetes mellitus ([place])   Obesity (BMI 30-39.9)   Iron deficiency anemia   Gout   Pulmonary nodule  Resolved Problems:   Prediabetes  Hospital Course: Taken from prior notes.  Hector [name] is a 56 y.o. male with medical history significant of hypertension, hyperlipidemia, combined systolic/diastolic CHF, hypothyroidism, atrial fibrillation on Eliquis, CKD stage IV and obesity who presents to the emergency department due to increased shortness of breath, increased leg swelling and abdominal distention which started 4 days ago.  Patient states that he has been compliant with his medications, however, it is unknown if patient has been compliant with diet.   Patient was recently admitted from 9/26 of 10/2 due to acute on chronic systolic CHF which was managed with IV Lasix with greater than 15 L of fluid diuresed and about 7 pounds weight loss with weight being closer to patient's dry weight prior to discharge time.  Chest x-ray with pulmonary vascular congestion on admission. There was a concerning small nodular density at right base and CT chest was recommended. CT scan chest 1.Right lower lobe calcified granulomas. 2. Minimal ground-glass and strandy opacities in the right upper lobe and right middle lobe favored  as scarring. 3. Mild cardiomegaly. 4. Bilateral renal cysts. No follow-up imaging recommended.  10/18: Patient was started on IV diuresis.  He was found to have Lays chips at bedside, appears to have very low input about salt intake, when inquired stating that he is compliant with his low-salt diet??  Cardiology would like to continue with IV diuresis for 1-2 more days.  Renal functions currently stable.  10/19: Patient continued to have good diuresis with net negative of 8372 mL.  Weight decreased to 247 today, total loss of 10 pounds.  Renal functions seems improving with diuresis. Cardiology is recommending 1 more day of IV diuresis and then transition to torsemide 40 mg twice daily from tomorrow before discharge.  10/20: Patient remained stable.  Renal functions stable with slight improvement with diuresis.  Clinically appears euvolemic.  Patient is being discharged on torsemide 40 mg twice daily, he was prescribed torsemide as twice daily by his cardiologist before but he was just taking it daily.  He will continue with the rest of his current medications and need to have a close follow-up with his providers.  Patient is high risk for decompensation and mortality based on underlying comorbidities and severely depressed ejection fraction.  Assessment and Plan: * Acute on chronic systolic (congestive) heart failure (HCC) BNP 1,874.7 (this was 2089.8 on 11/18/2021) Chest x-ray showed enlarged cardiopericardial silhouette with pulmonary vascular congestion. Echocardiogram done on 09/29/2021 showed LVEF  <20%.  LV and severely decreased function.  No RWMA.  LV diastolic parameters are indeterminate.  RV systolic function mildly decreased. Repeat echocardiogram on 10/16 with similar findings and dilated IVC. Cardiology was consulted and  patient is being diuresed with IV diuresis. Not a candidate for ACE inhibitor/ARB/MR ACE/ARN 2/2 ckd. -Continue with IV diuresis-most likely will be transitioned to  p.o. from tomorrow. -Daily BMP and with -Strict intake and output -Ongoing dietary education  Atrial fibrillation with rapid ventricular response (HCC) Currently rate controlled, intermittently going in RVR.  Cardiology is recommending outpatient EP evaluation.  Patient has an history of paroxysmal atrial fibrillation/flutter -Continue with Coreg and Eliquis  CKD (chronic kidney disease), stage IV (HCC) Creatinine currently stable around baseline, some improvement with diuresis. -Monitor renal function while he is being diuresed -Avoid nephrotoxins  Essential hypertension Blood pressure currently within goal. -Continue IV Lasix and Coreg  Mixed hyperlipidemia - Continue Lipitor  Acquired hypothyroidism - Continue Synthroid  Type 2 diabetes mellitus (HCC) A1c of 7.2 on 11/19/2021 which makes him diabetic -Continue with SSI -Unable to do Farxiga due to poor renal function. -Will need low-dose insulin on discharge  Obesity (BMI 30-39.9) Estimated body mass index is 34.99 kg/m as calculated from the following:   Height as of this encounter: 5\' 11"  (1.803 m).   Weight as of this encounter: 113.8 kg.   -This will complicate overall prognosis -Counseling was provided  Iron deficiency anemia Most likely some element of anemia of chronic disease with CKD. - Continue home iron supplement  Gout - Continue home allopurinol  Pulmonary nodule Incidental finding on chest x-ray which prompted CT chest-please see the full report. -No further recommendations -Outpatient pulmonary evaluation   Consultants: Cardiology Procedures performed: None Disposition: Home Diet recommendation:  Discharge Diet Orders (From admission, onward)     Start     Ordered   12/12/21 0000  Diet - low sodium heart healthy        12/12/21 1250           Cardiac and Carb modified diet DISCHARGE MEDICATION: Allergies as of 12/12/2021       Reactions   Esomeprazole Magnesium Other (See  Comments)   Suspected interstitial nephritis 2018   Eggs Or Egg-derived Products Rash        Medication List     STOP taking these medications    hydrALAZINE 25 MG tablet Commonly known as: APRESOLINE   isosorbide dinitrate 5 MG tablet Commonly known as: ISORDIL       TAKE these medications    allopurinol 100 MG tablet Commonly known as: ZYLOPRIM Take 0.5 tablets (50 mg total) by mouth daily.   apixaban 5 MG Tabs tablet Commonly known as: ELIQUIS Take 1 tablet (5 mg total) by mouth 2 (two) times daily.   atorvastatin 80 MG tablet Commonly known as: LIPITOR Take 1 tablet (80 mg total) by mouth daily.   carvedilol 6.25 MG tablet Commonly known as: COREG Take 1 tablet (6.25 mg total) by mouth 2 (two) times daily with a meal.   dapagliflozin propanediol 10 MG Tabs tablet Commonly known as: Farxiga Take 1 tablet (10 mg total) by mouth daily before breakfast.   ferrous sulfate 325 (65 FE) MG tablet Take 1 tablet (325 mg total) by mouth daily with breakfast.   levothyroxine 100 MCG tablet Commonly known as: SYNTHROID Take 1 tablet (100 mcg total) by mouth daily before breakfast.   potassium chloride SA 20 MEQ tablet Commonly known as: KLOR-CON M Take 1 tablet (20 mEq total) by mouth daily.   Torsemide 40 MG Tabs Take 40 mg by mouth 2 (two) times daily. What changed: when to take this  Follow-up Information     Bender, Durene Cal, MD. Schedule an appointment as soon as possible for a visit in 1 week(s).   Specialty: Family Medicine Contact information: [place] [phone] [phone]         [name], MD. Schedule an appointment as soon as possible for a visit in 1 week(s).   Specialty: Cardiology Contact information: [place] [phone]                Discharge Exam: Danley Danker Weights   12/10/21 0500 12/11/21 0500 12/12/21 0521  Weight: 113.8 kg  112.3 kg 112.4 kg   General.  Obese gentleman, in no acute distress. Pulmonary.  Lungs clear bilaterally, normal respiratory effort. CV.  Regular rate and rhythm, no JVD, rub or murmur. Abdomen.  Soft, nontender, nondistended, BS positive. CNS.  Alert and oriented .  No focal neurologic deficit. Extremities.  No edema, no cyanosis, pulses intact and symmetrical. Psychiatry.  Judgment and insight appears normal.   Condition at discharge: stable  The results of significant diagnostics from this hospitalization (including imaging, microbiology, ancillary and laboratory) are listed below for reference.   Imaging Studies: ECHOCARDIOGRAM LIMITED  Result Date: 12/08/2021    ECHOCARDIOGRAM LIMITED REPORT   Patient Name:   ALEKSANDER EDMISTON Date of Exam: 12/08/2021 Medical Rec #:  [id number]           Height:       71.0 in Accession #:    [id number]          Weight:       257.0 lb Date of Birth:  22-Oct-1965           BSA:          2.346 m Patient Age:    22 years            BP:           130/84 mmHg Patient Gender: M                   HR:           62 bpm. Exam Location:  [facility] Procedure: 2D Echo, Cardiac Doppler and Color Doppler Indications:     Y77.41 CHF Acute systolic  History:         Patient has prior history of Echocardiogram examinations, most                  recent 09/29/2021. Cardiomyopathy and CHF, Arrythmias:Atrial                  Fibrillation and Atrial Flutter; Risk Factors:Hypertension,                  Dyslipidemia, Former Smoker, Diabetes and Sleep Apnea.  Sonographer:     Rosalia Hammers Referring Phys:  [id number] OLADAPO ADEFESO Diagnosing Phys: Kathlyn Sacramento MD  Sonographer Comments: Suboptimal parasternal window. Image acquisition challenging due to uncooperative patient, Image acquisition challenging due to patient body habitus and Image acquisition challenging due to respiratory motion. IMPRESSIONS  1. Left ventricular ejection fraction, by estimation, is <20%. The left ventricle has  severely decreased function. The left ventricle has no regional wall motion abnormalities. The left ventricular internal cavity size was mildly to moderately dilated.  2. Right ventricular systolic function is normal. The right ventricular size is normal. There is mildly elevated pulmonary artery systolic pressure.  3. Left atrial size was severely  dilated.  4. Right atrial size was mildly dilated.  5. The mitral valve is normal in structure. Moderate to severe mitral valve regurgitation. No evidence of mitral stenosis.  6. The aortic valve is normal in structure. Aortic valve regurgitation is not visualized. No aortic stenosis is present.  7. Moderately dilated pulmonary artery.  8. The inferior vena cava is dilated in size with <50% respiratory variability, suggesting right atrial pressure of 15 mmHg. FINDINGS  Left Ventricle: Left ventricular ejection fraction, by estimation, is <20%. The left ventricle has severely decreased function. The left ventricle has no regional wall motion abnormalities. The left ventricular internal cavity size was mildly to moderately dilated. There is no left ventricular hypertrophy. Right Ventricle: The right ventricular size is normal. No increase in right ventricular wall thickness. Right ventricular systolic function is normal. There is mildly elevated pulmonary artery systolic pressure. The tricuspid regurgitant velocity is 2.46  m/s, and with an assumed right atrial pressure of 15 mmHg, the estimated right ventricular systolic pressure is 84.6 mmHg. Left Atrium: Left atrial size was severely dilated. Right Atrium: Right atrial size was mildly dilated. Pericardium: There is no evidence of pericardial effusion. Mitral Valve: The mitral valve is normal in structure. Moderate to severe mitral valve regurgitation. No evidence of mitral valve stenosis. Tricuspid Valve: The tricuspid valve is normal in structure. Tricuspid valve regurgitation is mild . No evidence of tricuspid  stenosis. Aortic Valve: The aortic valve is normal in structure. Aortic valve regurgitation is not visualized. No aortic stenosis is present. Aortic valve mean gradient measures 1.0 mmHg. Aortic valve peak gradient measures 1.9 mmHg. Aortic valve area, by VTI measures 4.40 cm. Pulmonic Valve: The pulmonic valve was normal in structure. Pulmonic valve regurgitation is mild. No evidence of pulmonic stenosis. Aorta: The aortic root is normal in size and structure. Pulmonary Artery: The pulmonary artery is moderately dilated. Venous: The inferior vena cava is dilated in size with less than 50% respiratory variability, suggesting right atrial pressure of 15 mmHg. IAS/Shunts: No atrial level shunt detected by color flow Doppler. LEFT VENTRICLE PLAX 2D LVIDd:         5.90 cm LVIDs:         5.20 cm LV PW:         1.30 cm LV IVS:        1.10 cm LVOT diam:     2.30 cm LV SV:         45 LV SV Index:   19 LVOT Area:     4.15 cm  LV Volumes (MOD) LV vol d, MOD A2C: 185.0 ml LV vol d, MOD A4C: 223.0 ml LV vol s, MOD A2C: 134.0 ml LV vol s, MOD A4C: 170.0 ml LV SV MOD A2C:     51.0 ml LV SV MOD A4C:     223.0 ml LV SV MOD BP:      56.2 ml RIGHT VENTRICLE RV Basal diam:  4.60 cm RV Mid diam:    3.40 cm LEFT ATRIUM              Index        RIGHT ATRIUM           Index LA diam:        4.50 cm  1.92 cm/m   RA Area:     20.00 cm LA Vol (A2C):   115.0 ml 49.03 ml/m  RA Volume:   58.90 ml  25.11 ml/m LA Vol (A4C):   92.9  ml  39.60 ml/m LA Biplane Vol: 110.0 ml 46.89 ml/m  AORTIC VALVE AV Area (Vmax):    4.42 cm AV Area (Vmean):   3.94 cm AV Area (VTI):     4.40 cm AV Vmax:           68.40 cm/s AV Vmean:          46.200 cm/s AV VTI:            0.103 m AV Peak Grad:      1.9 mmHg AV Mean Grad:      1.0 mmHg LVOT Vmax:         72.80 cm/s LVOT Vmean:        43.800 cm/s LVOT VTI:          0.109 m LVOT/AV VTI ratio: 1.06  AORTA Ao Root diam: 3.30 cm MITRAL VALVE                TRICUSPID VALVE MV Area (PHT): 5.31 cm     TR Peak  grad:   24.2 mmHg MV Decel Time: 143 msec     TR Vmax:        246.00 cm/s MV A velocity: 127.00 cm/s                             SHUNTS                             Systemic VTI:  0.11 m                             Systemic Diam: 2.30 cm Kathlyn Sacramento MD Electronically signed by Kathlyn Sacramento MD Signature Date/Time: 12/08/2021/2:38:45 PM    Final    CT CHEST WO CONTRAST  Result Date: 12/07/2021 CLINICAL DATA:  Lung nodule. EXAM: CT CHEST WITHOUT CONTRAST TECHNIQUE: Multidetector CT imaging of the chest was performed following the standard protocol without IV contrast. RADIATION DOSE REDUCTION: This exam was performed according to the departmental dose-optimization program which includes automated exposure control, adjustment of the mA and/or kV according to patient size and/or use of iterative reconstruction technique. COMPARISON:  Chest x-ray 12/07/2021 FINDINGS: Cardiovascular: The heart is mildly enlarged. Aorta is normal in size. There is no pericardial effusion. There are atherosclerotic calcifications of the aorta and coronary arteries. Mediastinum/Nodes: No enlarged mediastinal or axillary lymph nodes. Thyroid gland, trachea, and esophagus demonstrate no significant findings. Lungs/Pleura: There are calcified granulomas in the right lower lobe. There are minimal ground-glass and strandy opacities in the inferior right upper lobe and right middle lobe favored as scarring. No pleural effusion or pneumothorax. Trachea and central airways are patent. Upper Abdomen: Bilateral renal cysts are present. These measure up to 3.8 cm on the left. Musculoskeletal: No chest wall mass or suspicious bone lesions identified. IMPRESSION: 1. Right lower lobe calcified granulomas. 2. Minimal ground-glass and strandy opacities in the right upper lobe and right middle lobe favored as scarring. 3. Mild cardiomegaly. 4. Bilateral renal cysts. No follow-up imaging recommended. Aortic Atherosclerosis (ICD10-I70.0).  Electronically Signed   By: Ronney Asters M.D.   On: 12/07/2021 21:08   DG Chest 2 View  Result Date: 12/07/2021 CLINICAL DATA:  Heart failure. EXAM: CHEST - 2 VIEW COMPARISON:  11/18/2021 FINDINGS: 1713 hours. The cardio pericardial silhouette is enlarged. There is pulmonary vascular congestion without overt pulmonary  edema. Small nodular density seen lateral right costophrenic sulcus. No substantial pleural effusion. IMPRESSION: Enlarged cardiopericardial silhouette with pulmonary vascular congestion. Small nodular density at the right base. CT chest without contrast recommended to further evaluate. Electronically Signed   By: Misty Stanley M.D.   On: 12/07/2021 17:27   DG Chest 2 View  Result Date: 11/18/2021 CLINICAL DATA:  Shortness of breath, abdominal swelling and pain x2 weeks. EXAM: CHEST - 2 VIEW COMPARISON:  Chest radiograph October 13, 2021. FINDINGS: Similar cardiomegaly and central vascular congestion. Bibasilar and perihilar predominant interstitial opacities likely reflect edema. Tiny right pleural effusion. No pneumothorax. The visualized skeletal structures are unchanged. IMPRESSION: Similar cardiomegaly and central vascular congestion with probable mild interstitial edema and trace right pleural effusion. Electronically Signed   By: Dahlia Bailiff M.D.   On: 11/18/2021 13:08    Microbiology: Results for orders placed or performed during the hospital encounter of 10/13/21  Resp Panel by RT-PCR (Flu A&B, Covid) Anterior Nasal Swab     Status: None   Collection Time: 10/13/21  3:50 PM   Specimen: Anterior Nasal Swab  Result Value Ref Range Status   SARS Coronavirus 2 by RT PCR NEGATIVE NEGATIVE Final    Comment: (NOTE) SARS-CoV-2 target nucleic acids are NOT DETECTED.  The SARS-CoV-2 RNA is generally detectable in upper respiratory specimens during the acute phase of infection. The lowest concentration of SARS-CoV-2 viral copies this assay can detect is 138 copies/mL. A  negative result does not preclude SARS-Cov-2 infection and should not be used as the sole basis for treatment or other patient management decisions. A negative result may occur with  improper specimen collection/handling, submission of specimen other than nasopharyngeal swab, presence of viral mutation(s) within the areas targeted by this assay, and inadequate number of viral copies(<138 copies/mL). A negative result must be combined with clinical observations, patient history, and epidemiological information. The expected result is Negative.  Fact Sheet for Patients:  EntrepreneurPulse.com.au  Fact Sheet for Healthcare Providers:  IncredibleEmployment.be  This test is no t yet approved or cleared by the [place] FDA and  has been authorized for detection and/or diagnosis of SARS-CoV-2 by FDA under an Emergency Use Authorization (EUA). This EUA will remain  in effect (meaning this test can be used) for the duration of the COVID-19 declaration under Section 564(b)(1) of the Act, 21 U.S.C.section 360bbb-3(b)(1), unless the authorization is terminated  or revoked sooner.       Influenza A by PCR NEGATIVE NEGATIVE Final   Influenza B by PCR NEGATIVE NEGATIVE Final    Comment: (NOTE) The Xpert Xpress SARS-CoV-2/FLU/RSV plus assay is intended as an aid in the diagnosis of influenza from Nasopharyngeal swab specimens and should not be used as a sole basis for treatment. Nasal washings and aspirates are unacceptable for Xpert Xpress SARS-CoV-2/FLU/RSV testing.  Fact Sheet for Patients: EntrepreneurPulse.com.au  Fact Sheet for Healthcare Providers: IncredibleEmployment.be  This test is not yet approved or cleared by the [place] FDA and has been authorized for detection and/or diagnosis of SARS-CoV-2 by FDA under an Emergency Use Authorization (EUA). This EUA will remain in effect (meaning this test can  be used) for the duration of the COVID-19 declaration under Section 564(b)(1) of the Act, 21 U.S.C. section 360bbb-3(b)(1), unless the authorization is terminated or revoked.  Performed at [facility], [address] [place], [place] [address]     Labs: CBC: Recent Labs  Lab 12/07/21 1411 12/08/21 0436  WBC 7.6 8.9  HGB  13.6 13.5  HCT 44.6 43.8  MCV 80.8 80.2  PLT 245 967   Basic Metabolic Panel: Recent Labs  Lab 12/08/21 0436 12/09/21 0408 12/10/21 0911 12/11/21 0347 12/12/21 0856  NA 140 137 137 135 137  K 3.9 3.8 4.3 3.7 4.2  CL 105 103 101 99 102  CO2 26 22 26 26 25   GLUCOSE 106* 97 167* 101* 143*  BUN 49* 53* 53* 49* 51*  CREATININE 3.21* 3.23* 3.23* 2.98* 2.81*  CALCIUM 8.7* 8.5* 8.7* 8.4* 8.7*  MG 2.1  --   --   --   --   PHOS 3.8  --   --   --   --    Liver Function Tests: Recent Labs  Lab 12/07/21 1411 12/08/21 0436  AST 15 17  ALT 15 14  ALKPHOS 72 67  BILITOT 1.9* 2.3*  PROT 6.9 6.6  ALBUMIN 3.6 3.4*   CBG: Recent Labs  Lab 12/08/21 2306 12/09/21 0737 12/09/21 1138 12/12/21 0724 12/12/21 1113  GLUCAP 125* 173* 148* 133* 123*    Discharge time spent: greater than 30 minutes.  This record has been created using Systems analyst. Errors have been sought and corrected,but may not always be located. Such creation errors do not reflect on the standard of care.   Signed: Lorella Nimrod, MD [facility] 12/12/2021

## 2021-12-12 NOTE — Progress Notes (Signed)
Rounding Note    Patient Name: Hector [name] Date of Encounter: 12/12/2021  [place] Cardiologist: Ida Rogue, MD   Subjective   Patient seen on AM rounds. Denies any chest pain or shortness of breath. -2.6 L output in the last 24 hours. No AM labs during the time of rounds.  Inpatient Medications    Scheduled Meds:  allopurinol  50 mg Oral Daily   apixaban  5 mg Oral BID   atorvastatin  80 mg Oral Daily   carvedilol  6.25 mg Oral BID WC   ferrous sulfate  325 mg Oral Q breakfast   furosemide  40 mg Intravenous Q12H   insulin aspart  0-9 Units Subcutaneous TID WC   levothyroxine  100 mcg Oral QAC breakfast   Continuous Infusions:  PRN Meds:    Vital Signs    Vitals:   12/11/21 2343 12/12/21 0307 12/12/21 0521 12/12/21 0737  BP: 92/75 (!) 108/94  (!) 111/98  Pulse: 88 (!) 48  91  Resp: 17 19  (!) 21  Temp: 98 F (36.7 C) 98 F (36.7 C)  98.5 F (36.9 C)  TempSrc:    Oral  SpO2: 95% 97%  97%  Weight:   112.4 kg   Height:        Intake/Output Summary (Last 24 hours) at 12/12/2021 0752 Last data filed at 12/12/2021 0500 Gross per 24 hour  Intake --  Output 2675 ml  Net -2675 ml      12/12/2021    5:21 AM 12/11/2021    5:00 AM 12/10/2021    5:00 AM  Last 3 Weights  Weight (lbs) 247 lb 12.8 oz 247 lb 9.2 oz 250 lb 14.4 oz  Weight (kg) 112.401 kg 112.3 kg 113.807 kg      Telemetry    Rate controlled atrial fibrillation 90-110 - Personally Reviewed  ECG    No new tracings - Personally Reviewed  Physical Exam   GEN: No acute distress.   Neck: unable to assess JVD due to positioning and body habitus Cardiac: irregularly irregular, no murmurs, rubs, or gallops.  Respiratory: Clear to auscultation bilaterally. Respirations are unlabored on room air at rest. GI: Soft, nontender, non-distended  MS: Trace pretibial edema to bilateral lower extremities; No deformity. Neuro:  Nonfocal  Psych: Normal affect   Labs    High  Sensitivity Troponin:   Recent Labs  Lab 11/18/21 1234  TROPONINIHS 52*     Chemistry Recent Labs  Lab 12/07/21 1411 12/08/21 0436 12/09/21 0408 12/10/21 0911 12/11/21 0347  NA 139 140 137 137 135  K 4.1 3.9 3.8 4.3 3.7  CL 108 105 103 101 99  CO2 25 26 22 26 26   GLUCOSE 116* 106* 97 167* 101*  BUN 49* 49* 53* 53* 49*  CREATININE 3.36* 3.21* 3.23* 3.23* 2.98*  CALCIUM 8.6* 8.7* 8.5* 8.7* 8.4*  MG  --  2.1  --   --   --   PROT 6.9 6.6  --   --   --   ALBUMIN 3.6 3.4*  --   --   --   AST 15 17  --   --   --   ALT 15 14  --   --   --   ALKPHOS 72 67  --   --   --   BILITOT 1.9* 2.3*  --   --   --   GFRNONAA 21* 22* 21* 21* 24*  ANIONGAP 6 9 12  10 10    Lipids No results for input(s): "CHOL", "TRIG", "HDL", "LABVLDL", "LDLCALC", "CHOLHDL" in the last 168 hours.  Hematology Recent Labs  Lab 12/07/21 1411 12/08/21 0436  WBC 7.6 8.9  RBC 5.52 5.46  HGB 13.6 13.5  HCT 44.6 43.8  MCV 80.8 80.2  MCH 24.6* 24.7*  MCHC 30.5 30.8  RDW 22.5* 22.5*  PLT 245 239   Thyroid No results for input(s): "TSH", "FREET4" in the last 168 hours.  BNP Recent Labs  Lab 12/07/21 1411  BNP 1,874.7*    DDimer No results for input(s): "DDIMER" in the last 168 hours.   Radiology    No results found.  Cardiac Studies   TTE 09/29/2021 1. Left ventricular ejection fraction, by estimation, is <20%. The left  ventricle has severely decreased function. The left ventricle has no  regional wall motion abnormalities. The left ventricular internal cavity  size was moderately dilated. Left  ventricular diastolic parameters are indeterminate.   2. Right ventricular systolic function is mildly reduced. The right  ventricular size is normal. Tricuspid regurgitation signal is inadequate  for assessing PA pressure.   3. Left atrial size was moderately dilated.   4. Right atrial size was mildly dilated.   5. The mitral valve is normal in structure. Moderate mitral valve  regurgitation. No  evidence of mitral stenosis.   6. The aortic valve is normal in structure. Aortic valve regurgitation is  not visualized. Aortic valve sclerosis/calcification is present, without  any evidence of aortic stenosis.   7. The inferior vena cava is dilated in size with <50% respiratory  variability, suggesting right atrial pressure of 15 mmHg.   Patient Profile     56 y.o. male with a history of nonischemic cardiomyopathy EF less than 20%, hypertension, paroxysmal atrial fibrillation, CKD4 presenting with shortness of breath and leg edema in the context of medication noncompliance, who is being seen and evaluated for CHF exacerbation.   Assessment & Plan    NICM/ acute systolic and diastolic heart failure -bnp 1874.7 -LVEF less than 20% -recently hospitalized in August following discharge noted medication noncompliance -not a candidate for ACEi/ARB/ARNI, or spiro secondary to stage IV CKD -continued on furosemide 40 mg IVP BID with good urine output -does not appear to be volume up on exam, transition to 40 mg torsemide daily -continue coreg 6.25 mg bid -PTA farxiga and bidil remains on hold -unable to escalate medications due to low blood pressures -continue with heart failure education -daily weight, I&O, low sodium diet  Paroxysmal atrial fibrillation -remains in rate controlled atrial fibrillation -CHADS-VASc of at least 6 -continued on carvedilol -continued on apixaban 5 mg bid  CKD stage IV -no labs this morning -monitor I&O -monitor/trend/replete electrolytes as needed -daily bmp -avoid nephrotoxic agents where able  Nonobstructive coronary artery disease -currently remains chest pain free -high sensitivity troponin elevated in the setting of exacerbation of CHF -continued on bb therapy  -apixaban in lieu of aspirin  Essential hypertension -blood pressure 111/98 -continued on carvedilol and furosemide -vital signs per unit protocol  Hyperlipidemia -continued on  statin therapy    For questions or updates, please contact [place] Please consult [email or web address] for contact info under        Signed, Ericson Nafziger, NP  12/12/2021, 7:52 AM

## 2021-12-17 ENCOUNTER — Encounter: Payer: Self-pay | Admitting: Nurse Practitioner

## 2021-12-17 ENCOUNTER — Ambulatory Visit: Payer: Medicaid Other | Attending: Nurse Practitioner | Admitting: Nurse Practitioner

## 2021-12-17 VITALS — BP 121/94 | HR 99 | Ht 71.0 in | Wt 255.8 lb

## 2021-12-17 DIAGNOSIS — E782 Mixed hyperlipidemia: Secondary | ICD-10-CM | POA: Diagnosis not present

## 2021-12-17 DIAGNOSIS — N184 Chronic kidney disease, stage 4 (severe): Secondary | ICD-10-CM

## 2021-12-17 DIAGNOSIS — I10 Essential (primary) hypertension: Secondary | ICD-10-CM | POA: Diagnosis not present

## 2021-12-17 DIAGNOSIS — I5022 Chronic systolic (congestive) heart failure: Secondary | ICD-10-CM | POA: Diagnosis not present

## 2021-12-17 DIAGNOSIS — I4819 Other persistent atrial fibrillation: Secondary | ICD-10-CM

## 2021-12-17 DIAGNOSIS — I2510 Atherosclerotic heart disease of native coronary artery without angina pectoris: Secondary | ICD-10-CM

## 2021-12-17 DIAGNOSIS — E1122 Type 2 diabetes mellitus with diabetic chronic kidney disease: Secondary | ICD-10-CM

## 2021-12-17 NOTE — Progress Notes (Signed)
Office Visit    Patient Name: Hector [name] Date of Encounter: 12/17/2021  Primary Care Provider:  [name], MD Primary Cardiologist:  Hector Rogue, MD  Chief Complaint    56 year old male with history of chronic combined systolic and diastolic congestive heart failure, nonischemic cardiomyopathy presumed to be secondary to alcohol, persistent atrial fibrillation/flutter, hypertension, hyperlipidemia, type 2 diabetes mellitus, sleep apnea on CPAP, stage IV chronic kidney disease, hypothyroidism, medication noncompliance, and GI bleed on Eliquis, presents for follow-up after recent hospitalization for recurrent heart failure.  Past Medical History    Past Medical History:  Diagnosis Date   Alcohol abuse    Alcoholic cardiomyopathy ([place])    a. 12/2007 MV: EF 28%, no isch;  b. 8/12 Echo: EF 25-35%; c. 02/2014 Echo: EF 20-25%; d. 12/2014 Cath: minimal CAD; e. 01/2015 Echo: EF 50-55%;  d. 05/2016 Echo: EF 30-35%, diff HK, gr2 DD; e. 11/2016 Echo: EF 45-50%, diff HK; f. 09/2021 Echo: EF <20%; g. 11/2021 Echo: EF<20%.   Chronic combined systolic (congestive) and diastolic (congestive) heart failure ([place])    a. 05/2016 Echo: EF 30-35%; b. 11/2016 Echo: EF 45-50%, diff HK; c. 09/2021 Echo: EF < 20%; d. 11/2021 Echo: EF <20%, no rwma, Nl RV fxn, sev dil LA, mod-sev MR. mod dil PA w/ mildly elev PASP.   CKD (chronic kidney disease), stage III (HCC)    Elevated troponin (chronic)    Essential hypertension    GI bleed 11/2013   Hyperlipidemia    Pancreatitis    Paroxysmal A-fib (HCC)    a. new onset s/p unsuccessful TEE/DCCV on 08/16/2014; b. CHA2DS2VASc = 3-> eliquis (freq noncompliant); c. 02/2021 s/p TEE/DCCV; d. Prev on amio->d/c 2/2 abnl TFTs.   Paroxysmal atrial flutter (HCC)    Sleep-disordered breathing    Has yet to have a sleep study   Stroke [facility])    Past Surgical History:  Procedure Laterality Date   CARDIAC CATHETERIZATION N/A 01/09/2015   Procedure: Left  Heart Cath and Coronary Angiography;  Surgeon: Hector Man, MD;  Location: [facility];  Service: Cardiovascular;  Laterality: N/A;   CARDIOVERSION N/A 03/05/2021   Procedure: CARDIOVERSION;  Surgeon: Hector Sable, MD;  Location: [facility] ORS;  Service: Cardiovascular;  Laterality: N/A;   COLONOSCOPY N/A 07/22/2020   Procedure: COLONOSCOPY;  Surgeon: [place], Hector Pike, MD;  Location: [facility] ENDOSCOPY;  Service: Gastroenterology;  Laterality: N/A;   ELECTROPHYSIOLOGIC STUDY N/A 08/16/2014   Procedure: CARDIOVERSION;  Surgeon: Hector Merritts, MD;  Location: [facility] ORS;  Service: Cardiovascular;  Laterality: N/A;   FLEXIBLE SIGMOIDOSCOPY N/A 10/10/2015   Procedure: FLEXIBLE SIGMOIDOSCOPY;  Surgeon: Hector Sails, MD;  Location: [facility] ENDOSCOPY;  Service: Endoscopy;  Laterality: N/A;   KNEE SURGERY Right    NM MYOVIEW LTD  November 2011   No ischemia or infarction. EF 50-55% ( no improvement from 2009 Myoview EF of 28%   TEE WITHOUT CARDIOVERSION N/A 08/16/2014   Procedure: TRANSESOPHAGEAL ECHOCARDIOGRAM (TEE);  Surgeon: Hector Merritts, MD;  Location: [facility] ORS;  Service: Cardiovascular;  Laterality: N/A;   TEE WITHOUT CARDIOVERSION N/A 03/05/2021   Procedure: TRANSESOPHAGEAL ECHOCARDIOGRAM (TEE);  Surgeon: Hector Sable, MD;  Location: [facility] ORS;  Service: Cardiovascular;  Laterality: N/A;    Allergies  Allergies  Allergen Reactions   Esomeprazole Magnesium Other (See Comments)    Suspected interstitial nephritis 2018   Eggs Or Egg-Derived Products Rash    History of Present Illness    56 year old male with the  above complex past medical history including chronic combined systolic and diastolic congestive heart failure, nonischemic cardiomyopathy presumed secondary to alcohol use, persistent atrial flutter/fibrillation, hypertension, hyperlipidemia, type 2 diabetes mellitus, sleep apnea on CPAP, stage IV chronic kidney disease, hypothyroidism, medication noncompliance, and GI  bleed on Eliquis.  Heart failure history dates back to at least 2009, and was felt to be secondary to alcohol use and atrial arrhythmias.  EF was 20% at that time.  In the setting of noncompliance with anticoagulation and follow-up, he was rate controlled for prolonged period of time.  He subsequently underwent multiple noninvasive ischemic evaluations, which were low risk, and finally underwent diagnostic catheterization in 2016, which showed minimal, nonobstructive CAD.  He underwent cardioversion in December 2016, and with restoration of sinus rhythm, his EF did improve.  He was later placed on amiodarone but this was subsequently discontinued secondary to abnormal thyroid function test.  He has since been on levothyroxine.  He was readmitted in January 2023 with recurrent atrial flutter/fib and heart failure.  TEE and cardioversion was carried out with restoration of sinus rhythm.  EF at that time was less than 20% with severely reduced RV function.  He was seen by electrophysiology in March 2023, in the outpatient setting, and reported noncompliance with medications.  He was not felt to be a suitable candidate for either catheter ablation or ICD therapy.  In July 2023, he was admitted with complaints of abdominal discomfort and distention.  He had went extensive GI work-up including MRCP and HIDA scan with recommendation for conservative therapy.  He was discharged home off of diuretic therapy due to worsening renal function.  He was readmitted September 28, 2021 in the setting of worsening volume overload and noncompliance.  He was also noted to be back in A-fib with RVR with rates in the low 100s.  Echo during admission showed an EF of less than 20% and home medications were resumed.  Unfortunately, he was readmitted in late August due to increasing volume overload and dyspnea in the setting of skipping his home medications.  Hospitalization was complicated by acute on chronic renal failure limiting medical  therapy.  Unfortunately, [name] required readmission in late September and again on October 15 in the setting of worsening lower extremity and abdominal edema with associated dyspnea and fatigue.  He was again aggressively diuresed with good response.  Limited echo continues to show an EF of less than 20%.  He was discharged home on October 20 at a weight of 247 pounds and presents for follow-up today.  Patient says that he was confused about what medicine she should stop at discharge and therefore, he has not been taking carvedilol or Eliquis.  He has been taking torsemide 40 mg twice a day as prescribed.  He denies dyspnea but thinks that he may be holding onto some fluid in his abdomen.  He does not weigh himself at home because he says he is too lazy.  He denies chest pain, palpitations, PND, orthopnea, dizziness, syncope, lower extremity edema, or early satiety.  Weight is up to 255.8 pounds on our scale today, though he does have on heavy clothing and boots.  Home Medications    Current Outpatient Medications  Medication Sig Dispense Refill   allopurinol (ZYLOPRIM) 100 MG tablet Take 0.5 tablets (50 mg total) by mouth daily. 15 tablet 3   apixaban (ELIQUIS) 5 MG TABS tablet Take 1 tablet (5 mg total) by mouth 2 (two) times daily. 60 tablet 6  atorvastatin (LIPITOR) 80 MG tablet Take 1 tablet (80 mg total) by mouth daily. 90 tablet 11   carvedilol (COREG) 6.25 MG tablet Take 1 tablet (6.25 mg total) by mouth 2 (two) times daily with a meal. 60 tablet 3   dapagliflozin propanediol (FARXIGA) 10 MG TABS tablet Take 1 tablet (10 mg total) by mouth daily before breakfast. 90 tablet 3   ferrous sulfate 325 (65 FE) MG tablet Take 1 tablet (325 mg total) by mouth daily with breakfast. 90 tablet 3   levothyroxine (SYNTHROID) 100 MCG tablet Take 1 tablet (100 mcg total) by mouth daily before breakfast. 30 tablet 11   potassium chloride SA (KLOR-CON M) 20 MEQ tablet Take 1 tablet (20 mEq total) by  mouth daily. 30 tablet 11   Torsemide 40 MG TABS Take 40 mg by mouth 2 (two) times daily. (Patient taking differently: Take 40 mg by mouth daily.) 60 tablet 3   No current facility-administered medications for this visit.     Review of Systems    He thinks he might be holding onto some fluid in his abdomen.  He has not been weighing at home.  He denies chest pain, palpitations, dyspnea, PND, orthopnea, dizziness, syncope, edema, or early satiety.  All other systems reviewed and are otherwise negative except as noted above.    Physical Exam    VS:  BP (!) 121/94 (BP Location: Right Arm, Patient Position: Sitting, Cuff Size: Normal)   Pulse 99   Ht 5\' 11"  (1.803 m)   Wt 255 lb 12.8 oz (116 kg)   SpO2 98%   BMI 35.68 kg/m  , BMI Body mass index is 35.68 kg/m.     GEN: Obese, in no acute distress. HEENT: normal. Neck: Supple, moderately elevated JVD.  No bruits or masses.   Cardiac: Irregularly irregular, no murmurs, rubs, or gallops. No clubbing, cyanosis, edema.  Radials/PT 2+ and equal bilaterally.  Respiratory:  Respirations regular and unlabored, clear to auscultation bilaterally. GI: Soft, nontender, nondistended, BS + x 4. MS: no deformity or atrophy. Skin: warm and dry, no rash. Neuro:  Strength and sensation are intact. Psych: Normal affect.  Accessory Clinical Findings    ECG personally reviewed by me today -atrial fibrillation, 99, left axis deviation, left anterior fascicular block, lateral T wave inversion, poor R wave progression- no acute changes.  Lab Results  Component Value Date   WBC 8.9 12/08/2021   HGB 13.5 12/08/2021   HCT 43.8 12/08/2021   MCV 80.2 12/08/2021   PLT 239 12/08/2021   Lab Results  Component Value Date   CREATININE 2.81 (H) 12/12/2021   BUN 51 (H) 12/12/2021   NA 137 12/12/2021   K 4.2 12/12/2021   CL 102 12/12/2021   CO2 25 12/12/2021   Lab Results  Component Value Date   ALT 14 12/08/2021   AST 17 12/08/2021   ALKPHOS 67  12/08/2021   BILITOT 2.3 (H) 12/08/2021   Lab Results  Component Value Date   CHOL 89 02/03/2020   HDL 15 (L) 02/03/2020   LDLCALC 49 02/03/2020   TRIG 125 02/03/2020   CHOLHDL 5.9 02/03/2020    Lab Results  Component Value Date   HGBA1C 7.2 (H) 11/19/2021   Lab Results  Component Value Date   TSH 3.714 10/14/2021    Assessment & Plan    1.  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  He has had multiple admissions over the past several months secondary to volume overload and often rooted in noncompliance.  He was most recently admitted a week ago and responded well to intravenous diuretics with a discharge weight of 247 pounds.  He has not been weighing himself at home.  He thinks that maybe he is holding onto some fluid in his abdomen.  His weight is listed at 255.8 pounds on our scale today, though he does have on heavy clothing and boots.  He does not have any lower extremity edema, his abdomen is soft, and lungs are clear.  He is chronically elevated JVD.  I strongly encouraged him to weigh himself at home daily.  In discussing his medications, he has not been taking his carvedilol which I strongly encouraged that he resume.  He Admits compliance with torsemide and [place].  He is not a candidate for ACE/ARB/ARN I/MRA secondary to stage IV chronic kidney disease.  I will make no changes to his medicines today.  He has follow-up in heart failure clinic in approximately 3 weeks at which time, he should have a basic metabolic panel to ensure stability of renal function.  I did offer to bring him back into clinic sooner than 3 weeks however, he declined.  Previous evaluated by EP and not currently felt to be an ICD candidate in the setting of long history of noncompliance.  2.   Persistent atrial fibrillation/flutter: Relatively rate controlled at 99 bpm although patient notes that he has not been taking his carvedilol.  I strongly encouraged him to resume carvedilol 6.25 mg twice daily as prescribed.  Not felt to be a candidate for ablation given history of noncompliance.  3.  Essential hypertension diastolic blood pressure elevation at 121/94.  Resume carvedilol as outlined above.  4.  Stage IV chronic kidney disease: Creatinine 2.81 at discharge October 20, though he has been typically running in the 3-4 range.  No change diuretic dosing today.  Should have follow-up basic metabolic panel when seen in heart failure clinic in November.  5.  Hyperlipidemia: Prescribed atorvastatin but he is not sure if he is taking.  Encouraged him to review his medications and resume atorvastatin if not taking.  6.  Nonobstructive CAD/demand ischemia: Minor irregularities on catheterization 2016.  No chest pain.  Recommend continuation/resumption of beta-blocker and statin therapy.  7.  Hypothyroidism: On levothyroxine with normal TSH in August.  8.  Type 2 diabetes mellitus: A1c 7.2 in September.  He is prescribed Wilder Glade only and says he is taking it.  He is followed by primary care.  9.  Obstructive sleep apnea: Unclear compliance with CPAP at home.  10.  Disposition: Follow-up in heart failure clinic as scheduled in November.  Follow-up here in approximately 8 weeks.  Murray Hodgkins, NP 12/17/2021, 4:11 PM

## 2021-12-17 NOTE — Patient Instructions (Signed)
Medication Instructions:   *If you need a refill on your cardiac medications before your next appointment, please call your pharmacy*   Lab Work:  If you have labs (blood work) drawn today and your tests are completely normal, you will receive your results only by: [place] (if you have MyChart) OR A paper copy in the mail If you have any lab test that is abnormal or we need to change your treatment, we will call you to review the results.   Testing/Procedures:    Follow-Up: At [facility], you and your health needs are our priority.  As part of our continuing mission to provide you with exceptional heart care, we have created designated Provider Care Teams.  These Care Teams include your primary Cardiologist (physician) and Advanced Practice Providers (APPs -  Physician Assistants and Nurse Practitioners) who all work together to provide you with the care you need, when you need it.  We recommend signing up for the patient portal called "MyChart".  Sign up information is provided on this After Visit Summary.  MyChart is used to connect with patients for Virtual Visits (Telemedicine).  Patients are able to view lab/test results, encounter notes, upcoming appointments, etc.  Non-urgent messages can be sent to your provider as well.   To learn more about what you can do with MyChart, go to [email or web address].    Your next appointment:   2 month(s)  The format for your next appointment:   In Person  Provider:   Murray Hodgkins, NP    Other Instructions   Important Information About Sugar

## 2021-12-23 ENCOUNTER — Ambulatory Visit: Payer: Medicaid Other | Admitting: Family

## 2021-12-24 ENCOUNTER — Telehealth (HOSPITAL_COMMUNITY): Payer: Self-pay

## 2021-12-24 NOTE — Telephone Encounter (Signed)
Attempted to contact, non answer left a message about making a home visit.  Will continue to try.   [place] EMT-paramedic [phone]

## 2021-12-25 ENCOUNTER — Other Ambulatory Visit (HOSPITAL_COMMUNITY): Payer: Self-pay

## 2021-12-25 ENCOUNTER — Encounter (HOSPITAL_COMMUNITY): Payer: Self-pay

## 2021-12-25 ENCOUNTER — Telehealth: Payer: Self-pay | Admitting: Family

## 2021-12-25 NOTE — Telephone Encounter (Signed)
Received phone call from [place] (paramedic). She is there with the patient and says that today's weight is 252 pounds although he hasn't been weighing every day. He says that he isn't eating any salt and is not drinking more than 60-64 ounces daily/ She says that his abdomen is tighter and he says that he started gaining weight "as soon as I left the hospital".   Currently taking torsemide 40mg  BID & is supposed to be taking daily potassium but he says that he has to go pick it up from the pharmacy. Emphasized that he pick this up today.   Kenton Kingfisher that he could take 1 extra 40mg  dose of torsemide today only and that he needs to keep his appt w/ [place] on 01/08/22

## 2021-12-25 NOTE — Progress Notes (Signed)
Today had a home visit with Hector [name].  He states he started gaining fluid 2 days after getting out of hospital.  Abdomen is getting tighter and he has some shortness of breath with exertion.  He is not weighing, he did pull the scale out and weighed while I was there. Unsure what his weight should be.  He has some edema in legs and leaving sock lines.  He states legs are worse.  Contacted Tina with HF clinic and advised her.  His lungs were clear and denies eating salty foods or drinking more than 64 ounces a day.  He is out of potassium and advised he will pick it up today.  She advised to take an extra torsemide today and back to normal regimen tomorrow.  And also to take his potassium.  He appeared to understand.  Advised him I would call him on Monday and make another visit if need so. Advised him again importance of weighing.  Also advised him to elevate legs when sitting, he states he never does.  Explained importance of elevating them.  He has his other medications and he is aware of how to take them.  He is living with his brother also hoping his mother will get out of snf and come live also.  Will continue to visit for heart failure, diet and medication management.   [place] EMT-paramedic [phone]

## 2021-12-31 ENCOUNTER — Telehealth (HOSPITAL_COMMUNITY): Payer: Self-pay

## 2021-12-31 NOTE — Telephone Encounter (Signed)
Spoke with Hector [name] today and he states abdomen has went down.  He states weight has went down a few lbs but did not say a weight.  He states feels much better.  Reminded him of his HF clinic appt next week and he states he knows.  Will continue to visit for heart failure, diet and medication management.   [place] [phone]

## 2022-01-06 ENCOUNTER — Other Ambulatory Visit: Payer: Self-pay

## 2022-01-06 ENCOUNTER — Emergency Department: Payer: Medicaid Other

## 2022-01-06 ENCOUNTER — Inpatient Hospital Stay
Admission: EM | Admit: 2022-01-06 | Discharge: 2022-01-12 | DRG: 291 | Disposition: A | Discharge status: Home / Self Care | Payer: Medicaid Other | Attending: Internal Medicine | Admitting: Internal Medicine

## 2022-01-06 ENCOUNTER — Inpatient Hospital Stay: Payer: Medicaid Other

## 2022-01-06 DIAGNOSIS — Z20822 Contact with and (suspected) exposure to covid-19: Secondary | ICD-10-CM | POA: Diagnosis present

## 2022-01-06 DIAGNOSIS — I4819 Other persistent atrial fibrillation: Secondary | ICD-10-CM

## 2022-01-06 DIAGNOSIS — Z833 Family history of diabetes mellitus: Secondary | ICD-10-CM

## 2022-01-06 DIAGNOSIS — R0902 Hypoxemia: Secondary | ICD-10-CM | POA: Diagnosis present

## 2022-01-06 DIAGNOSIS — I4892 Unspecified atrial flutter: Secondary | ICD-10-CM | POA: Diagnosis present

## 2022-01-06 DIAGNOSIS — J189 Pneumonia, unspecified organism: Secondary | ICD-10-CM | POA: Diagnosis present

## 2022-01-06 DIAGNOSIS — Z8673 Personal history of transient ischemic attack (TIA), and cerebral infarction without residual deficits: Secondary | ICD-10-CM

## 2022-01-06 DIAGNOSIS — I509 Heart failure, unspecified: Secondary | ICD-10-CM

## 2022-01-06 DIAGNOSIS — I2510 Atherosclerotic heart disease of native coronary artery without angina pectoris: Secondary | ICD-10-CM | POA: Diagnosis present

## 2022-01-06 DIAGNOSIS — Z7901 Long term (current) use of anticoagulants: Secondary | ICD-10-CM | POA: Diagnosis not present

## 2022-01-06 DIAGNOSIS — Z87891 Personal history of nicotine dependence: Secondary | ICD-10-CM | POA: Diagnosis not present

## 2022-01-06 DIAGNOSIS — I5023 Acute on chronic systolic (congestive) heart failure: Secondary | ICD-10-CM | POA: Diagnosis not present

## 2022-01-06 DIAGNOSIS — M109 Gout, unspecified: Secondary | ICD-10-CM | POA: Diagnosis present

## 2022-01-06 DIAGNOSIS — I428 Other cardiomyopathies: Secondary | ICD-10-CM | POA: Diagnosis present

## 2022-01-06 DIAGNOSIS — N179 Acute kidney failure, unspecified: Secondary | ICD-10-CM | POA: Diagnosis present

## 2022-01-06 DIAGNOSIS — E039 Hypothyroidism, unspecified: Secondary | ICD-10-CM | POA: Diagnosis present

## 2022-01-06 DIAGNOSIS — E119 Type 2 diabetes mellitus without complications: Secondary | ICD-10-CM | POA: Diagnosis present

## 2022-01-06 DIAGNOSIS — I5043 Acute on chronic combined systolic (congestive) and diastolic (congestive) heart failure: Secondary | ICD-10-CM | POA: Diagnosis present

## 2022-01-06 DIAGNOSIS — Z91012 Allergy to eggs: Secondary | ICD-10-CM

## 2022-01-06 DIAGNOSIS — Z8249 Family history of ischemic heart disease and other diseases of the circulatory system: Secondary | ICD-10-CM | POA: Diagnosis not present

## 2022-01-06 DIAGNOSIS — I4821 Permanent atrial fibrillation: Secondary | ICD-10-CM | POA: Diagnosis present

## 2022-01-06 DIAGNOSIS — J9601 Acute respiratory failure with hypoxia: Principal | ICD-10-CM

## 2022-01-06 DIAGNOSIS — Z6835 Body mass index (BMI) 35.0-35.9, adult: Secondary | ICD-10-CM

## 2022-01-06 DIAGNOSIS — E785 Hyperlipidemia, unspecified: Secondary | ICD-10-CM | POA: Diagnosis present

## 2022-01-06 DIAGNOSIS — Z83438 Family history of other disorder of lipoprotein metabolism and other lipidemia: Secondary | ICD-10-CM

## 2022-01-06 DIAGNOSIS — E669 Obesity, unspecified: Secondary | ICD-10-CM | POA: Diagnosis present

## 2022-01-06 DIAGNOSIS — Z91148 Patient's other noncompliance with medication regimen for other reason: Secondary | ICD-10-CM

## 2022-01-06 DIAGNOSIS — Z79899 Other long term (current) drug therapy: Secondary | ICD-10-CM

## 2022-01-06 DIAGNOSIS — I4891 Unspecified atrial fibrillation: Secondary | ICD-10-CM | POA: Diagnosis present

## 2022-01-06 DIAGNOSIS — G4733 Obstructive sleep apnea (adult) (pediatric): Secondary | ICD-10-CM | POA: Diagnosis present

## 2022-01-06 DIAGNOSIS — N2889 Other specified disorders of kidney and ureter: Secondary | ICD-10-CM | POA: Diagnosis present

## 2022-01-06 DIAGNOSIS — N184 Chronic kidney disease, stage 4 (severe): Secondary | ICD-10-CM | POA: Diagnosis not present

## 2022-01-06 DIAGNOSIS — E1122 Type 2 diabetes mellitus with diabetic chronic kidney disease: Secondary | ICD-10-CM | POA: Diagnosis present

## 2022-01-06 DIAGNOSIS — Z888 Allergy status to other drugs, medicaments and biological substances status: Secondary | ICD-10-CM | POA: Diagnosis not present

## 2022-01-06 DIAGNOSIS — I130 Hypertensive heart and chronic kidney disease with heart failure and stage 1 through stage 4 chronic kidney disease, or unspecified chronic kidney disease: Secondary | ICD-10-CM | POA: Diagnosis present

## 2022-01-06 DIAGNOSIS — Z79890 Hormone replacement therapy (postmenopausal): Secondary | ICD-10-CM

## 2022-01-06 DIAGNOSIS — Z91119 Patient's noncompliance with dietary regimen due to unspecified reason: Secondary | ICD-10-CM

## 2022-01-06 LAB — COMPREHENSIVE METABOLIC PANEL
ALT: 12 U/L (ref 0–44)
AST: 24 U/L (ref 15–41)
Albumin: 3.6 g/dL (ref 3.5–5.0)
Alkaline Phosphatase: 70 U/L (ref 38–126)
Anion gap: 12 (ref 5–15)
BUN: 41 mg/dL — ABNORMAL HIGH (ref 6–20)
CO2: 21 mmol/L — ABNORMAL LOW (ref 22–32)
Calcium: 8.9 mg/dL (ref 8.9–10.3)
Chloride: 104 mmol/L (ref 98–111)
Creatinine, Ser: 3.1 mg/dL — ABNORMAL HIGH (ref 0.61–1.24)
GFR, Estimated: 23 mL/min — ABNORMAL LOW (ref >60)
Glucose, Bld: 108 mg/dL — ABNORMAL HIGH (ref 70–99)
Potassium: 4.7 mmol/L (ref 3.5–5.1)
Sodium: 137 mmol/L (ref 135–145)
Total Bilirubin: 2.5 mg/dL — ABNORMAL HIGH (ref 0.3–1.2)
Total Protein: 7.1 g/dL (ref 6.5–8.1)

## 2022-01-06 LAB — APTT: aPTT: 36 seconds (ref 24–36)

## 2022-01-06 LAB — CBC
HCT: 46.3 % (ref 39.0–52.0)
Hemoglobin: 14.5 g/dL (ref 13.0–17.0)
MCH: 25.8 pg — ABNORMAL LOW (ref 26.0–34.0)
MCHC: 31.3 g/dL (ref 30.0–36.0)
MCV: 82.2 fL (ref 80.0–100.0)
Platelets: 235 10*3/uL (ref 150–400)
RBC: 5.63 MIL/uL (ref 4.22–5.81)
RDW: 22.9 % — ABNORMAL HIGH (ref 11.5–15.5)
WBC: 8.2 10*3/uL (ref 4.0–10.5)
nRBC: 0 % (ref 0.0–0.2)

## 2022-01-06 LAB — BRAIN NATRIURETIC PEPTIDE: B Natriuretic Peptide: 1949.1 pg/mL — ABNORMAL HIGH (ref 0.0–100.0)

## 2022-01-06 LAB — TROPONIN I (HIGH SENSITIVITY)
Troponin I (High Sensitivity): 61 ng/L — ABNORMAL HIGH (ref <18)
Troponin I (High Sensitivity): 71 ng/L — ABNORMAL HIGH (ref <18)

## 2022-01-06 MED ORDER — FUROSEMIDE 10 MG/ML IJ SOLN
80.0000 mg | Freq: Once | INTRAMUSCULAR | Status: AC
  Administered 2022-01-06: 80 mg via INTRAVENOUS
  Filled 2022-01-06: qty 8

## 2022-01-06 MED ORDER — ALLOPURINOL 100 MG PO TABS
50.0000 mg | ORAL_TABLET | Freq: Every day | ORAL | Status: DC
  Administered 2022-01-06 – 2022-01-12 (×7): 50 mg via ORAL
  Filled 2022-01-06 (×4): qty 1
  Filled 2022-01-06: qty 0.5
  Filled 2022-01-06 (×2): qty 1

## 2022-01-06 MED ORDER — FERROUS GLUCONATE 324 (38 FE) MG PO TABS
324.0000 mg | ORAL_TABLET | Freq: Every day | ORAL | Status: DC
  Administered 2022-01-07 – 2022-01-11 (×5): 324 mg via ORAL
  Filled 2022-01-06 (×6): qty 1

## 2022-01-06 MED ORDER — ONDANSETRON HCL 4 MG PO TABS: 4.0000 mg | ORAL_TABLET | Freq: Four times a day (QID) | ORAL | Status: DC | PRN

## 2022-01-06 MED ORDER — FUROSEMIDE 10 MG/ML IJ SOLN
40.0000 mg | Freq: Two times a day (BID) | INTRAMUSCULAR | Status: DC
  Administered 2022-01-06: 40 mg via INTRAVENOUS
  Filled 2022-01-06: qty 4

## 2022-01-06 MED ORDER — POTASSIUM CHLORIDE CRYS ER 20 MEQ PO TBCR
20.0000 meq | EXTENDED_RELEASE_TABLET | Freq: Every day | ORAL | Status: DC
  Administered 2022-01-07 – 2022-01-12 (×6): 20 meq via ORAL
  Filled 2022-01-06 (×6): qty 1

## 2022-01-06 MED ORDER — ACETAMINOPHEN 325 MG PO TABS: 650.0000 mg | ORAL_TABLET | Freq: Four times a day (QID) | ORAL | Status: DC | PRN

## 2022-01-06 MED ORDER — APIXABAN 5 MG PO TABS
5.0000 mg | ORAL_TABLET | Freq: Two times a day (BID) | ORAL | Status: DC
  Administered 2022-01-06 – 2022-01-12 (×13): 5 mg via ORAL
  Filled 2022-01-06 (×13): qty 1

## 2022-01-06 MED ORDER — CARVEDILOL 6.25 MG PO TABS
6.2500 mg | ORAL_TABLET | Freq: Two times a day (BID) | ORAL | Status: DC
  Administered 2022-01-06 – 2022-01-12 (×12): 6.25 mg via ORAL
  Filled 2022-01-06 (×12): qty 1

## 2022-01-06 MED ORDER — ATORVASTATIN CALCIUM 80 MG PO TABS
80.0000 mg | ORAL_TABLET | Freq: Every day | ORAL | Status: DC
  Administered 2022-01-06 – 2022-01-12 (×7): 80 mg via ORAL
  Filled 2022-01-06: qty 1
  Filled 2022-01-06: qty 4
  Filled 2022-01-06 (×5): qty 1

## 2022-01-06 MED ORDER — METOPROLOL TARTRATE 5 MG/5ML IV SOLN
5.0000 mg | Freq: Once | INTRAVENOUS | Status: AC
  Administered 2022-01-06: 5 mg via INTRAVENOUS
  Filled 2022-01-06: qty 5

## 2022-01-06 MED ORDER — ACETAMINOPHEN 650 MG RE SUPP: 650.0000 mg | Freq: Four times a day (QID) | RECTAL | Status: DC | PRN

## 2022-01-06 MED ORDER — LEVOTHYROXINE SODIUM 100 MCG PO TABS
100.0000 ug | ORAL_TABLET | Freq: Every day | ORAL | Status: DC
  Administered 2022-01-07 – 2022-01-12 (×6): 100 ug via ORAL
  Filled 2022-01-06 (×6): qty 1

## 2022-01-06 MED ORDER — ONDANSETRON HCL 4 MG/2ML IJ SOLN: 4.0000 mg | Freq: Four times a day (QID) | INTRAMUSCULAR | Status: DC | PRN

## 2022-01-06 NOTE — Assessment & Plan Note (Signed)
Estimated body mass index is 35.52 kg/m as calculated from the following:   Height as of this encounter: 5\' 11"  (1.803 m).   Weight as of this encounter: 115.5 kg.   Complicates overall prognosis and care Lifestyle modification and exercise has been discussed with patient in detail

## 2022-01-06 NOTE — H&P (Addendum)
History and Physical    Patient: Hector [name] URK:[id number] DOB: 12/10/1965 DOA: 01/06/2022 DOS: the patient was seen and examined on 01/06/2022 PCP: [name], MD  Patient coming from: Home  Chief Complaint:  Chief Complaint  Patient presents with   Shortness of Breath   HPI: Hector [name] is a 56 y.o. male with medical history significant for nonischemic cardiomyopathy with last known LVEF less than 20%, stage IV chronic kidney disease, hypertension, paroxysmal atrial fibrillation on chronic anticoagulation therapy, obesity hypothyroidism who presents to the ER for evaluation of increased abdominal girth, lower extremity swelling and exertional shortness of breath. Patient states that he has been compliant with his medications but admits to dietary indiscretion.  He feels that he has gained weight but is unable to tell me how much He has had several hospitalizations in the last 2 months for acute CHF and was recently discharged on 12/12/21. He denies having any chest pain, no abdominal pain, no nausea, no vomiting, no dizziness, no lightheadedness, no changes in his bowel habits, no fever, no chills, no cough, no urinary symptoms, no blurred vision, no focal deficit. Labs show BNP 1949, troponin 61 Chest x-ray reviewed by me shows cardiomegaly with pulmonary vascular congestion and interstitial edema. More focal opacity in the left upper lobe, which may reflect alveolar edema or superimposed pneumonia. He was noted to be in rapid A-fib when he arrived to ER and received a dose of metoprolol 5 mg IV push he was also hypoxic with room air pulse oximetry in the 80s and is currently on 4 L of oxygen to maintain pulse oximetry greater than 92% He received a dose of Lasix 80 mg IV in the ER and will be admitted to the hospital for further evaluation.    Review of Systems: As mentioned in the history of present illness. All other systems reviewed and are negative. Past  Medical History:  Diagnosis Date   Alcohol abuse    Alcoholic cardiomyopathy ([place])    a. 12/2007 MV: EF 28%, no isch;  b. 8/12 Echo: EF 25-35%; c. 02/2014 Echo: EF 20-25%; d. 12/2014 Cath: minimal CAD; e. 01/2015 Echo: EF 50-55%;  d. 05/2016 Echo: EF 30-35%, diff HK, gr2 DD; e. 11/2016 Echo: EF 45-50%, diff HK; f. 09/2021 Echo: EF <20%; g. 11/2021 Echo: EF<20%.   Chronic combined systolic (congestive) and diastolic (congestive) heart failure ([place])    a. 05/2016 Echo: EF 30-35%; b. 11/2016 Echo: EF 45-50%, diff HK; c. 09/2021 Echo: EF < 20%; d. 11/2021 Echo: EF <20%, no rwma, Nl RV fxn, sev dil LA, mod-sev MR. mod dil PA w/ mildly elev PASP.   CKD (chronic kidney disease), stage III (HCC)    Elevated troponin (chronic)    Essential hypertension    GI bleed 11/2013   Hyperlipidemia    Pancreatitis    Paroxysmal A-fib (HCC)    a. new onset s/p unsuccessful TEE/DCCV on 08/16/2014; b. CHA2DS2VASc = 3-> eliquis (freq noncompliant); c. 02/2021 s/p TEE/DCCV; d. Prev on amio->d/c 2/2 abnl TFTs.   Paroxysmal atrial flutter (HCC)    Sleep-disordered breathing    Has yet to have a sleep study   Stroke [facility])    Past Surgical History:  Procedure Laterality Date   CARDIAC CATHETERIZATION N/A 01/09/2015   Procedure: Left Heart Cath and Coronary Angiography;  Surgeon: [name], MD;  Location: [place] CV LAB;  Service: Cardiovascular;  Laterality: N/A;   CARDIOVERSION N/A 03/05/2021   Procedure: CARDIOVERSION;  Surgeon: Kate Sable, MD;  Location: [facility] ORS;  Service: Cardiovascular;  Laterality: N/A;   COLONOSCOPY N/A 07/22/2020   Procedure: COLONOSCOPY;  Surgeon: Toledo, Benay Pike, MD;  Location: [facility] ENDOSCOPY;  Service: Gastroenterology;  Laterality: N/A;   ELECTROPHYSIOLOGIC STUDY N/A 08/16/2014   Procedure: CARDIOVERSION;  Surgeon: [name], MD;  Location: [facility] ORS;  Service: Cardiovascular;  Laterality: N/A;   FLEXIBLE SIGMOIDOSCOPY N/A 10/10/2015   Procedure: FLEXIBLE  SIGMOIDOSCOPY;  Surgeon: [name], MD;  Location: [facility] ENDOSCOPY;  Service: Endoscopy;  Laterality: N/A;   KNEE SURGERY Right    NM MYOVIEW LTD  November 2011   No ischemia or infarction. EF 50-55% ( no improvement from 2009 Myoview EF of 28%   TEE WITHOUT CARDIOVERSION N/A 08/16/2014   Procedure: TRANSESOPHAGEAL ECHOCARDIOGRAM (TEE);  Surgeon: [name], MD;  Location: [facility] ORS;  Service: Cardiovascular;  Laterality: N/A;   TEE WITHOUT CARDIOVERSION N/A 03/05/2021   Procedure: TRANSESOPHAGEAL ECHOCARDIOGRAM (TEE);  Surgeon: Kate Sable, MD;  Location: [facility] ORS;  Service: Cardiovascular;  Laterality: N/A;   Social History:  reports that he quit smoking about 23 years ago. His smoking use included cigarettes. He has a 12.00 pack-year smoking history. He has never used smokeless tobacco. He reports that he does not currently use alcohol. He reports that he does not use drugs.  Allergies  Allergen Reactions   Esomeprazole Magnesium Other (See Comments)    Suspected interstitial nephritis 2018   Eggs Or Egg-Derived Products Rash    Family History  Problem Relation Age of Onset   Hypertension Mother    Hyperlipidemia Mother    Diabetes Mother     Prior to Admission medications   Medication Sig Start Date End Date Taking? Authorizing Provider  allopurinol (ZYLOPRIM) 100 MG tablet Take 0.5 tablets (50 mg total) by mouth daily. 10/03/21  Yes Danford, Suann Larry, MD  apixaban (ELIQUIS) 5 MG TABS tablet Take 1 tablet (5 mg total) by mouth 2 (two) times daily. 10/02/21  Yes Danford, Suann Larry, MD  atorvastatin (LIPITOR) 80 MG tablet Take 1 tablet (80 mg total) by mouth daily. 11/24/21  Yes Annita Brod, MD  carvedilol (COREG) 6.25 MG tablet Take 1 tablet (6.25 mg total) by mouth 2 (two) times daily with a meal. 10/02/21  Yes Danford, Suann Larry, MD  dapagliflozin propanediol (FARXIGA) 10 MG TABS tablet Take 1 tablet (10 mg total) by mouth daily before breakfast.  10/02/21  Yes Danford, Suann Larry, MD  ferrous gluconate (FERGON) 324 MG tablet Take 324 mg by mouth daily with breakfast. 12/22/21  Yes [provider]  Ferrous Gluconate 324 (37.5 Fe) MG TABS Take 1 tablet by mouth every 12 (twelve) hours as needed. 12/22/21  Yes [provider]  ferrous sulfate 325 (65 FE) MG tablet Take 1 tablet (325 mg total) by mouth daily with breakfast. 12/12/21  Yes Lorella Nimrod, MD  levothyroxine (SYNTHROID) 100 MCG tablet Take 1 tablet (100 mcg total) by mouth daily before breakfast. 11/24/21  Yes Annita Brod, MD  potassium chloride SA (KLOR-CON M) 20 MEQ tablet Take 1 tablet (20 mEq total) by mouth daily. 11/24/21  Yes Annita Brod, MD  Torsemide 40 MG TABS Take 40 mg by mouth 2 (two) times daily. Patient taking differently: Take 40 mg by mouth daily. 11/24/21 01/06/22 Yes Annita Brod, MD    Physical Exam: Vitals:   01/06/22 1100 01/06/22 1130 01/06/22 1200 01/06/22 1509  BP: (!) 138/110 112/84 (!) 131/104  Pulse: 66 (!) 124 (!) 113   Resp: 19 (!) 21 (!) 32   Temp:    98 F (36.7 C)  TempSrc:    Oral  SpO2: 97% 90% 91%   Weight:      Height:       Physical Exam Vitals and nursing note reviewed.  Constitutional:      Comments: Looks older than stated age  HENT:     Head: Normocephalic and atraumatic.     Mouth/Throat:     Mouth: Mucous membranes are moist.  Eyes:     Comments: Pale conjunctiva   Cardiovascular:     Rate and Rhythm: Tachycardia present. Rhythm irregular.  Pulmonary:     Effort: Tachypnea present.     Breath sounds: Examination of the right-lower field reveals rales. Examination of the left-lower field reveals rales. Rales present.  Abdominal:     General: Bowel sounds are normal.     Palpations: Abdomen is soft.     Comments: Central adiposity  Musculoskeletal:     Cervical back: Normal range of motion.     Right lower leg: Edema present.     Left lower leg: Edema present.  Skin:     General: Skin is warm and dry.  Neurological:     General: No focal deficit present.     Mental Status: He is alert.  Psychiatric:        Mood and Affect: Mood normal.        Behavior: Behavior normal.     Data Reviewed: Relevant notes from primary care and specialist visits, past discharge summaries as available in EHR, including Care Everywhere. Prior diagnostic testing as pertinent to current admission diagnoses Updated medications and problem lists for reconciliation ED course, including vitals, labs, imaging, treatment and response to treatment Triage notes, nursing and pharmacy notes and ED provider's notes Notable results as noted in HPI Labs reviewed.  BNP 1949, troponin 61, sodium 137, potassium 4.7, chloride 104, bicarb 21, glucose 108, BUN 41, creatinine 3.10, calcium 8.9, total protein 7.1, albumin 3.6, AST 24, ALT 12, alkaline phosphatase 70, total bilirubin 2.5, white count 8.2, hemoglobin 14.5, hematocrit 46.3, platelet count 235 Twelve-lead EKG reviewed by me shows A-fib with a rapid ventricular rate with PVCs.  Left anterior vascular block  There are no new results to review at this time.  Assessment and Plan: * Acute on chronic combined systolic (congestive) and diastolic (congestive) heart failure ([place]) Patient presents for evaluation of increased abdominal girth, exertional shortness of breath and lower extremity swelling. Also noted to be in rapid A-fib with a rapid ventricular rate His last known LVEF is less than 20% from a 2D echocardiogram which was done in 11/2021 We will place patient on Lasix 40 mg IV every 12 Continue carvedilol Patient is not on an ACE inhibitor or ARB due to his chronic kidney injury Maintain low-sodium diet and fluid restriction Consult cardiology  Atrial fibrillation with RVR ([place]) Patient has a history of paroxysmal A-fib on chronic anticoagulation with Eliquis Noted to be in rapid A-fib upon arrival to the ER with heart rate of 130  bpm He received a dose of metoprolol 5 mg IV push x1 Continue carvedilol for rate control Continue Eliquis as primary prophylaxis for an acute stroke  Hypothyroidism Continue Synthroid  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  All questions and concerns have been addressed.  He verbalizes understanding and agrees with the plan.  Severity of Illness: The appropriate patient status for this patient is INPATIENT. Inpatient status is judged to be reasonable and necessary in order to provide the required intensity of service to ensure the patient's safety. The patient's presenting symptoms, physical exam findings, and initial radiographic and laboratory data in the context of their chronic comorbidities is felt to place them at high risk for further clinical deterioration. Furthermore, it is not anticipated that the patient will be medically stable for discharge from the hospital within 2 midnights of admission.   * I certify that at the point of admission it is my clinical judgment that the patient will require inpatient hospital care spanning beyond 2 midnights from the point of admission due to high intensity of service, high risk for further deterioration and high frequency of surveillance required.*  Author: Collier Bullock, MD 01/06/2022 3:41 PM  For on call review [email or web address].

## 2022-01-06 NOTE — Assessment & Plan Note (Addendum)
Patient presents for evaluation of increased abdominal girth, exertional shortness of breath and lower extremity swelling. Also noted to be in rapid A-fib with a rapid ventricular rate His last known LVEF is less than 20% from a 2D echocardiogram which was done in 11/2021.  Patient received IV Lasix in ED followed by continuation of 40 mg twice daily. Worsening of renal function with not much urinary output recorded, might be due to cardiorenal. -Neurology increase the dose of IV Lasix to 80 mg twice daily -Continue with carvedilol -Daily BMP and weight -Strict intake and output

## 2022-01-06 NOTE — ED Provider Notes (Signed)
[facility] Provider Note    Event Date/Time   First MD Initiated Contact with Patient 01/06/22 1023     (approximate)   History   Shortness of Breath   HPI  Hector [name] is a 56 y.o. male with history of atrial fibrillation, CKD, hypertension, diabetes, gout, and obesity who presents with increased shortness of breath, abdominal swelling, and leg edema over the last week despite being compliant with his torsemide.  The patient states he feels similar to when he was last admitted to the hospital for CHF exacerbation.  He denies any cough or fever.  He has no chest pain but does report some abdominal discomfort with the swelling.  Per the RN the patient's O2 saturation was 80% on room air when he came in and he is not normally on oxygen.  I reviewed the past medical records including the hospitalist discharge summary from the patient's most recent admission on 10/20.  He was admitted at that time with increased shortness of breath, leg swelling, abdominal distention and was treated for CHF exacerbation with IV Lasix.   Physical Exam   Triage Vital Signs: ED Triage Vitals  Enc Vitals Group     BP 01/06/22 1012 (!) 125/115     Pulse Rate 01/06/22 1012 (!) 130     Resp 01/06/22 1012 18     Temp 01/06/22 1012 97.8 F (36.6 C)     Temp Source 01/06/22 1012 Oral     SpO2 01/06/22 1012 96 %     Weight 01/06/22 1013 252 lb 3.3 oz (114.4 kg)     Height 01/06/22 1013 5\' 11"  (1.803 m)     Head Circumference --      Peak Flow --      Pain Score 01/06/22 1013 7     Pain Loc --      Pain Edu? --      Excl. in [place]? --     Most recent vital signs: Vitals:   01/06/22 1012  BP: (!) 125/115  Pulse: (!) 130  Resp: 18  Temp: 97.8 F (36.6 C)  SpO2: 96%     General: Alert and oriented, no distress. CV:  Good peripheral perfusion.  Resp:  Increased effort.  Diminished breath sounds bilaterally. Abd:  Mild distention consistent with edema.  Other:  1+  bilateral lower extremity edema.   ED Results / Procedures / Treatments   Labs (all labs ordered are listed, but only abnormal results are displayed) Labs Reviewed  CBC - Abnormal; Notable for the following components:      Result Value   MCH 25.8 (*)    RDW 22.9 (*)    All other components within normal limits  COMPREHENSIVE METABOLIC PANEL - Abnormal; Notable for the following components:   CO2 21 (*)    Glucose, Bld 108 (*)    BUN 41 (*)    Creatinine, Ser 3.10 (*)    Total Bilirubin 2.5 (*)    GFR, Estimated 23 (*)    All other components within normal limits  BRAIN NATRIURETIC PEPTIDE - Abnormal; Notable for the following components:   B Natriuretic Peptide 1,949.1 (*)    All other components within normal limits  TROPONIN I (HIGH SENSITIVITY) - Abnormal; Notable for the following components:   Troponin I (High Sensitivity) 61 (*)    All other components within normal limits  APTT     EKG  ED ECG REPORT I, Arta Silence, the attending  physician, personally viewed and interpreted this ECG.  Date: 01/06/2022 EKG Time: 1016 Rate: 119 Rhythm: Atrial fibrillation with RVR QRS Axis: normal Intervals: normal ST/T Wave abnormalities: normal Narrative Interpretation: Atrial fibrillation with no evidence of acute ischemia    RADIOLOGY  Chest x-ray: I independently viewed and interpreted the images; there is bilateral interstitial edema consistent with CHF  PROCEDURES:  Critical Care performed: No  Procedures   MEDICATIONS ORDERED IN ED: Medications  metoprolol tartrate (LOPRESSOR) injection 5 mg (5 mg Intravenous Given 01/06/22 1102)  furosemide (LASIX) injection 80 mg (80 mg Intravenous Given 01/06/22 1103)     IMPRESSION / MDM / ASSESSMENT AND PLAN / ED COURSE  I reviewed the triage vital signs and the nursing notes.  56 year old male with PMH as noted above presents with increased shortness of breath, abdominal swelling and leg swelling over the  last week.  He was recently admitted for CHF exacerbation and diuresed over 15 L.  EKG is nonischemic but shows the patient in rapid atrial fibrillation.  Chest x-ray shows bilateral interstitial edema.  There is some area of possible superimposed consolidation although clinically the presentation is less consistent with pneumonia.  Differential diagnosis includes, but is not limited to, CHF exacerbation, edema due to renal or other etiology, pneumonia, ACS.  We will obtain lab work-up, give IV Lasix, treat the rapid atrial fibrillation with metoprolol, and reassess.  I anticipate admission given the patient's hypoxia.    Patient's presentation is most consistent with acute presentation with potential threat to life or bodily function.  The patient is on the cardiac monitor to evaluate for evidence of arrhythmia and/or significant heart rate changes.  ----------------------------------------- 11:50 AM on 01/06/2022 -----------------------------------------  Lab work-up is consistent with CHF with elevated BNP and minimally elevated troponin consistent with prior.  The patient's creatinine is also elevated but this appears chronic.  Heart rate is improved with metoprolol.  I consulted Dr. Francine Graven from the hospitalist service; based on her discussion she agrees to admit the patient.   FINAL CLINICAL IMPRESSION(S) / ED DIAGNOSES   Final diagnoses:  Acute respiratory failure with hypoxia (HCC)  Acute on chronic congestive heart failure, unspecified heart failure type (HCC)  Atrial fibrillation with RVR ([place])     Rx / DC Orders   ED Discharge Orders     None        Note:  This document was prepared using Dragon voice recognition software and may include unintentional dictation errors.    Arta Silence, MD 01/06/22 1151

## 2022-01-06 NOTE — ED Triage Notes (Signed)
First nurse report: pt ems from home for abd pain x 2 weeks and heart palpitations. Per ems pt with hx afib, pt in afib with heart rate 105-150. Pt with RA sats 80%. On 4lnc.

## 2022-01-06 NOTE — Consult Note (Signed)
Cardiology Consultation   [name] MRN: [id number]; DOB: [birth year]  Admit date: 01/06/2022 Date of Consult: 01/06/2022  PCP:  Hector [name], [place] [facility] Cardiologist:  Hector Rogue, MD        Patient Profile:   Hector [name] is a 56 y.o. male with a hx of chronic combined CHF/NICM likely secondary to alcohol use, hypertension, hyperlipidemia, paroxysmal atrial fibrillation/atrial flutter, type 2 diabetes, hypothyroidism, OSA on CPAP, CKD stage III-IV, medication noncompliance, history of GI bleed on apixaban, possible sleep apnea, who is being seen 01/06/2022 for the evaluation of shortness of breath at the request of Hector [name]..  History of Present Illness:   Hector [name] is a 56 year old male with a history of heart failure dates back to at least 2009, thought to presumably be nonischemic in the setting of alcohol use, with an EF of 20%.  He was admitted to the hospital in 02/2014 for fluid overload.  Echocardiogram demonstrated EF of 20-25%.  He was first seen in clinic 03/2014 for hospital follow-up.  It was noted he did undergo several ischemic evaluations in the past through an outside office.  Left heart catheterization in 12/2014 showed minimal CAD, which confirmed presumptive diagnosis of nonischemic cardiomyopathy.  He was referred to EP with plans to pursue ICD.  However this was canceled given improvement in his LV systolic function after restoration of sinus rhythm by echocardiogram in 01/2015.  Since that time he has continued to have fluctuations in his EF.  With regards to his atrial fibrillation/atrial flutter he previously been managed on amiodarone that was discontinued secondary to abnormal thyroid function.  He underwent TEE/DCCV in 02/2021 at that time showed an EF of less than 20% with severely reduced RV systolic function.  He was reevaluated by EP in 04/2021 and felt not to be a good candidate for ICD or  ablation given medication noncompliance.  Unfortunately Hector [name] required me admission in late September and again on December 07, 2021 in the setting of worsening lower extremity and abdominal edema with associated dyspnea and fatigue.  He was again aggressively diuresed with good response.  Limited echo continues to show EF of less than 20%.  He was discharged from the hospital on 12/12/2021 with a weight of 247 pounds.  He was last seen in clinic 12/17/2021 where he was encouraged to resume his carvedilol 6.25 mg twice daily and continue with heart failure clinic follow-up.  He presented to the [facility] emergency department 01/06/2022 with shortness of breath, abdominal swelling, palpitations, and peripheral edema that is been ongoing over the last week.  He states that he has been compliant with taking his torsemide.  He states that this feels similar to when he was last admitted to the hospital with a CHF exacerbation.  He denies any fever or cough.  Denies chest pain but does report some abdominal discomfort with the swelling.  On arrival he was found to have oxygen saturations of 80% on room air and subsequently had to be put on 4 L nasal cannula.  Initial vital signs: Blood pressure 125/115, pulse of 130, respirations of 18, temperature of 97.8  Pertinent labs: CO2 21, blood glucose of 108, BUN 41, serum creatinine at 3.10, total bilirubin of 2.5, estimated GFR of 23, BNP 1949.1, high-sensitivity troponin of 61  Imaging: Chest x-ray revealed cardiomegaly with pulmonary vascular congestion and interstitial edema, more focal opacity in the left upper lobe which may reflect alveolar  edema superimposed pneumonia  Medications administered in the emergency department: Metoprolol tartrate 5 mg IVP, furosemide 80 mg IVP  Past Medical History:  Diagnosis Date   Alcohol abuse    Alcoholic cardiomyopathy ([place])    a. 12/2007 MV: EF 28%, no isch;  b. 8/12 Echo: EF 25-35%; c. 02/2014 Echo: EF 20-25%; d.  12/2014 Cath: minimal CAD; e. 01/2015 Echo: EF 50-55%;  d. 05/2016 Echo: EF 30-35%, diff HK, gr2 DD; e. 11/2016 Echo: EF 45-50%, diff HK; f. 09/2021 Echo: EF <20%; g. 11/2021 Echo: EF<20%.   Chronic combined systolic (congestive) and diastolic (congestive) heart failure ([place])    a. 05/2016 Echo: EF 30-35%; b. 11/2016 Echo: EF 45-50%, diff HK; c. 09/2021 Echo: EF < 20%; d. 11/2021 Echo: EF <20%, no rwma, Nl RV fxn, sev dil LA, mod-sev MR. mod dil PA w/ mildly elev PASP.   CKD (chronic kidney disease), stage III (HCC)    Elevated troponin (chronic)    Essential hypertension    GI bleed 11/2013   Hyperlipidemia    Pancreatitis    Paroxysmal A-fib (HCC)    a. new onset s/p unsuccessful TEE/DCCV on 08/16/2014; b. CHA2DS2VASc = 3-> eliquis (freq noncompliant); c. 02/2021 s/p TEE/DCCV; d. Prev on amio->d/c 2/2 abnl TFTs.   Paroxysmal atrial flutter (HCC)    Sleep-disordered breathing    Has yet to have a sleep study   Stroke [facility])     Past Surgical History:  Procedure Laterality Date   CARDIAC CATHETERIZATION N/A 01/09/2015   Procedure: Left Heart Cath and Coronary Angiography;  Surgeon: [name], MD;  Location: [place] CV LAB;  Service: Cardiovascular;  Laterality: N/A;   CARDIOVERSION N/A 03/05/2021   Procedure: CARDIOVERSION;  Surgeon: Kate Sable, MD;  Location: [facility] ORS;  Service: Cardiovascular;  Laterality: N/A;   COLONOSCOPY N/A 07/22/2020   Procedure: COLONOSCOPY;  Surgeon: Toledo, Benay Pike, MD;  Location: [facility] ENDOSCOPY;  Service: Gastroenterology;  Laterality: N/A;   ELECTROPHYSIOLOGIC STUDY N/A 08/16/2014   Procedure: CARDIOVERSION;  Surgeon: [name], MD;  Location: [facility] ORS;  Service: Cardiovascular;  Laterality: N/A;   FLEXIBLE SIGMOIDOSCOPY N/A 10/10/2015   Procedure: FLEXIBLE SIGMOIDOSCOPY;  Surgeon: [name], MD;  Location: [facility] ENDOSCOPY;  Service: Endoscopy;  Laterality: N/A;   KNEE SURGERY Right    NM MYOVIEW LTD  November 2011   No ischemia or  infarction. EF 50-55% ( no improvement from 2009 Myoview EF of 28%   TEE WITHOUT CARDIOVERSION N/A 08/16/2014   Procedure: TRANSESOPHAGEAL ECHOCARDIOGRAM (TEE);  Surgeon: [name], MD;  Location: [facility] ORS;  Service: Cardiovascular;  Laterality: N/A;   TEE WITHOUT CARDIOVERSION N/A 03/05/2021   Procedure: TRANSESOPHAGEAL ECHOCARDIOGRAM (TEE);  Surgeon: Kate Sable, MD;  Location: [facility] ORS;  Service: Cardiovascular;  Laterality: N/A;     Home Medications:  Prior to Admission medications   Medication Sig Start Date End Date Taking? Authorizing Provider  allopurinol (ZYLOPRIM) 100 MG tablet Take 0.5 tablets (50 mg total) by mouth daily. 10/03/21  Yes Danford, Suann Larry, MD  apixaban (ELIQUIS) 5 MG TABS tablet Take 1 tablet (5 mg total) by mouth 2 (two) times daily. 10/02/21  Yes Danford, Suann Larry, MD  atorvastatin (LIPITOR) 80 MG tablet Take 1 tablet (80 mg total) by mouth daily. 11/24/21  Yes Annita Brod, MD  carvedilol (COREG) 6.25 MG tablet Take 1 tablet (6.25 mg total) by mouth 2 (two) times daily with a meal. 10/02/21  Yes Danford, Suann Larry, MD  dapagliflozin propanediol (  FARXIGA) 10 MG TABS tablet Take 1 tablet (10 mg total) by mouth daily before breakfast. 10/02/21  Yes Danford, Suann Larry, MD  ferrous gluconate (FERGON) 324 MG tablet Take 324 mg by mouth daily with breakfast. 12/22/21  Yes [provider]  Ferrous Gluconate 324 (37.5 Fe) MG TABS Take 1 tablet by mouth every 12 (twelve) hours as needed. 12/22/21  Yes [provider]  ferrous sulfate 325 (65 FE) MG tablet Take 1 tablet (325 mg total) by mouth daily with breakfast. 12/12/21  Yes Lorella Nimrod, MD  levothyroxine (SYNTHROID) 100 MCG tablet Take 1 tablet (100 mcg total) by mouth daily before breakfast. 11/24/21  Yes Annita Brod, MD  potassium chloride SA (KLOR-CON M) 20 MEQ tablet Take 1 tablet (20 mEq total) by mouth daily. 11/24/21  Yes Annita Brod, MD  Torsemide 40 MG  TABS Take 40 mg by mouth 2 (two) times daily. Patient taking differently: Take 40 mg by mouth daily. 11/24/21 01/06/22 Yes Annita Brod, MD    Inpatient Medications: Scheduled Meds:  Continuous Infusions:  PRN Meds:   Allergies:    Allergies  Allergen Reactions   Esomeprazole Magnesium Other (See Comments)    Suspected interstitial nephritis 2018   Eggs Or Egg-Derived Products Rash    Social History:   Social History   Socioeconomic History   Marital status: Single    Spouse name: Not on file   Number of children: Not on file   Years of education: Not on file   Highest education level: Not on file  Occupational History   Not on file  Tobacco Use   Smoking status: Former    Packs/day: 1.00    Years: 12.00    Total pack years: 12.00    Types: Cigarettes    Quit date: 02/23/1998    Years since quitting: 23.8   Smokeless tobacco: Never  Vaping Use   Vaping Use: Never used  Substance and Sexual Activity   Alcohol use: Not Currently    Alcohol/week: 0.0 standard drinks of alcohol    Comment: Past heavy drinker   Drug use: No   Sexual activity: Not Currently  Other Topics Concern   Not on file  Social History Narrative   Not on file   Social Determinants of Health   Financial Resource Strain: Low Risk  (01/04/2019)   Overall Financial Resource Strain (CARDIA)    Difficulty of Paying Living Expenses: Not hard at all  Food Insecurity: No Food Insecurity (12/08/2021)   Hunger Vital Sign    Worried About Running Out of Food in the Last Year: Never true    Ran Out of Food in the Last Year: Never true  Transportation Needs: No Transportation Needs (12/08/2021)   PRAPARE - Hydrologist (Medical): No    Lack of Transportation (Non-Medical): No  Physical Activity: Inactive (01/04/2019)   Exercise Vital Sign    Days of Exercise per Week: 0 days    Minutes of Exercise per Session: 0 min  Stress: No Stress Concern Present (01/04/2019)    [place]    Feeling of Stress : Not at all  Social Connections: Moderately Isolated (01/04/2019)   Social Connection and Isolation Panel [NHANES]    Frequency of Communication with Friends and Family: More than three times a week    Frequency of Social Gatherings with Friends and Family: More than three times a week  Attends Religious Services: 1 to 4 times per year    Active Member of Clubs or Organizations: No    Attends Archivist Meetings: Never    Marital Status: Never married  Intimate Partner Violence: Not At Risk (12/08/2021)   Humiliation, Afraid, Rape, and Kick questionnaire    Fear of Current or Ex-Partner: No    Emotionally Abused: No    Physically Abused: No    Sexually Abused: No    Family History:    Family History  Problem Relation Age of Onset   Hypertension Mother    Hyperlipidemia Mother    Diabetes Mother      ROS:  Please see the history of present illness.  Review of Systems  Constitutional:  Positive for malaise/fatigue.  Respiratory:  Positive for shortness of breath.   Cardiovascular:  Positive for palpitations and leg swelling.  Gastrointestinal:  Positive for abdominal pain.  Neurological:  Positive for weakness.    All other ROS reviewed and negative.     Physical Exam/Data:   Vitals:   01/06/22 1013 01/06/22 1100 01/06/22 1130 01/06/22 1200  BP:  (!) 138/110 112/84 (!) 131/104  Pulse:  66 (!) 124 (!) 113  Resp:  19 (!) 21 (!) 32  Temp:      TempSrc:      SpO2:  97% 90% 91%  Weight: 114.4 kg     Height: 5\' 11"  (1.803 m)      No intake or output data in the 24 hours ending 01/06/22 1320    01/06/2022   10:13 AM 12/25/2021   11:14 AM 12/17/2021    3:36 PM  Last 3 Weights  Weight (lbs) 252 lb 3.3 oz 252 lb 3.2 oz 255 lb 12.8 oz  Weight (kg) 114.4 kg 114.397 kg 116.03 kg     Body mass index is 35.18 kg/m.  General:  Well nourished, well developed, in  no acute distress sitting on the side of the emergency room stretcher talking with family HEENT: normal Neck: no JVD Vascular: No carotid bruits; Distal pulses 2+ bilaterally Cardiac:  normal S1, S2; RRR; no murmur  Lungs:  diminished to auscultation bilaterally, no wheezing, rhonchi or rales, previously had to be placed on 4 L of O2 via nasal cannula for hypoxia of 80%, patient is currently removed himself from oxygen therapy Abd: soft, slightly tender to mild palpation, no hepatomegaly  Ext: 1+ edema Musculoskeletal:  No deformities, BUE and BLE strength normal and equal Skin: warm and dry  Neuro:  CNs 2-12 intact, no focal abnormalities noted Psych:  Normal affect   EKG:  The EKG was personally reviewed and demonstrates: Atrial fibrillation rate of 119, left anterior fascicular block, LVH, aberrantly conducted beats Telemetry:  Telemetry was personally reviewed and demonstrates:  atrial fibrillation rates of 80-110  Relevant CV Studies: Limited TTE 12/08/2021 1. Left ventricular ejection fraction, by estimation, is <20%. The left  ventricle has severely decreased function. The left ventricle has no  regional wall motion abnormalities. The left ventricular internal cavity  size was mildly to moderately dilated.   2. Right ventricular systolic function is normal. The right ventricular  size is normal. There is mildly elevated pulmonary artery systolic  pressure.   3. Left atrial size was severely dilated.   4. Right atrial size was mildly dilated.   5. The mitral valve is normal in structure. Moderate to severe mitral  valve regurgitation. No evidence of mitral stenosis.   6. The aortic valve is  normal in structure. Aortic valve regurgitation is  not visualized. No aortic stenosis is present.   7. Moderately dilated pulmonary artery.   8. The inferior vena cava is dilated in size with <50% respiratory  variability, suggesting right atrial pressure of 15 mmHg.    Laboratory  Data:  High Sensitivity Troponin:   Recent Labs  Lab 01/06/22 1048  TROPONINIHS 61*     Chemistry Recent Labs  Lab 01/06/22 1048  NA 137  K 4.7  CL 104  CO2 21*  GLUCOSE 108*  BUN 41*  CREATININE 3.10*  CALCIUM 8.9  GFRNONAA 23*  ANIONGAP 12    Recent Labs  Lab 01/06/22 1048  PROT 7.1  ALBUMIN 3.6  AST 24  ALT 12  ALKPHOS 70  BILITOT 2.5*   Lipids No results for input(s): "CHOL", "TRIG", "HDL", "LABVLDL", "LDLCALC", "CHOLHDL" in the last 168 hours.  Hematology Recent Labs  Lab 01/06/22 1048  WBC 8.2  RBC 5.63  HGB 14.5  HCT 46.3  MCV 82.2  MCH 25.8*  MCHC 31.3  RDW 22.9*  PLT 235   Thyroid No results for input(s): "TSH", "FREET4" in the last 168 hours.  BNP Recent Labs  Lab 01/06/22 1048  BNP 1,949.1*    DDimer No results for input(s): "DDIMER" in the last 168 hours.   Radiology/Studies:  DG Chest Port 1 View  Result Date: 01/06/2022 CLINICAL DATA:  Shortness of breath EXAM: PORTABLE CHEST 1 VIEW COMPARISON:  12/07/2021 FINDINGS: Stable cardiomegaly. Pulmonary vascular congestion. Prominent perihilar and bibasilar interstitial markings. More focal opacity in the left upper lobe. No large pleural fluid collection. No pneumothorax. IMPRESSION: 1. Cardiomegaly with pulmonary vascular congestion and interstitial edema. 2. More focal opacity in the left upper lobe, which may reflect alveolar edema or superimposed pneumonia. Electronically Signed   By: Davina Poke D.O.   On: 01/06/2022 10:47     Assessment and Plan:   Acute on chronic combined systolic and diastolic congestive heart failure/nonischemic cardiomyopathy -Presented with shortness of breath, abdominal swelling , and peripheral edema -BNP 1949.1 -Limited echocardiogram completed previous in October with an LVEF<20% -Has had multiple hospitalizations for heart failure exacerbation likely secondary to noncompliance -Unfortunately he is not a case for ACE inhibitor/ARB/ARNI/MRA secondary  to stage IV chronic kidney disease -Recommend starting furosemide 40 mg IV twice daily -Started on carvedilol 6.25 mg twice daily -Continue heart failure education -Patient was previously advised deemed not a candidate for ablation or ICD insertion by EP due to noncompliance -Daily weight, I's and O's, low-sodium diet  Persistent atrial fibrillation/atrial flutter -Currently in atrial fibrillation rate controlled of 80-110 -Continue carvedilol 6.25 mg twice daily -Continued on apixaban 5 mg twice daily for CHA2DS2-VASc score of at least 6 -Continue cardiac monitoring  CKD stage IV -Serum creatinine 3.10 with BUN of 41 -Serum creatinine on last admission 3.21 and on discharge 2.81 -Daily BMP -Monitor urine output -Monitor/trend/replete electrolytes as needed -Avoid nephrotoxic agents were able  Essential hypertension -Blood pressure 131/104 -Continue on carvedilol 6.25 mg twice daily -Vital signs per unit protocol  Nonobstructive coronary artery disease -Remains chest pain-free -Trending high-sensitivity troponins -First high-sensitivity troponin 61 likely demand ischemia secondary to atrial fibrillation RVR and CHF exacerbation with elevated BNP of 1949 -Apixaban in lieu of aspirin -Continue on beta-blocker and statin therapy  Hyperlipidemia -Continue statin therapy  Obstructive sleep apnea -Patient states he uses CPAP at home  Hypothyroidism -Continue on levothyroxine -Management per primary team  Type 2 diabetes -A1c 7.2 on 11/19/2021 -  On Farxiga managed by PCP -Management per primary team  Medical nonadherence -Patient states he has been compliant with his torsemide but is unsure about his other medications   Risk Assessment/Risk Scores:        New York Heart Association (NYHA) Functional Class NYHA Class III  CHA2DS2-VASc Score = 6   This indicates a 9.7% annual risk of stroke. The patient's score is based upon: CHF History: 1 HTN History: 1 Diabetes  History: 1 Stroke History: 2 Vascular Disease History: 1 Age Score: 0 Gender Score: 0         For questions or updates, please contact [place] Please consult [email or web address] for contact info under    Signed, Parsa Rickett, NP  01/06/2022 1:20 PM

## 2022-01-06 NOTE — Assessment & Plan Note (Signed)
Patient was on Farxiga at home, had A1c at 7.7 checked in September 2023 -[place]

## 2022-01-06 NOTE — Assessment & Plan Note (Signed)
Continue Synthroid °

## 2022-01-06 NOTE — Assessment & Plan Note (Signed)
Patient has a history of paroxysmal A-fib on chronic anticoagulation with Eliquis Noted to be in rapid A-fib upon arrival to the ER with heart rate of 130 bpm He received a dose of metoprolol 5 mg IV push x1 Continue carvedilol for rate control Continue Eliquis as primary prophylaxis for an acute stroke

## 2022-01-07 ENCOUNTER — Encounter: Payer: Self-pay | Admitting: Internal Medicine

## 2022-01-07 DIAGNOSIS — I4891 Unspecified atrial fibrillation: Secondary | ICD-10-CM

## 2022-01-07 DIAGNOSIS — I5043 Acute on chronic combined systolic (congestive) and diastolic (congestive) heart failure: Secondary | ICD-10-CM | POA: Diagnosis not present

## 2022-01-07 LAB — CBC
HCT: 48.2 % (ref 39.0–52.0)
Hemoglobin: 15 g/dL (ref 13.0–17.0)
MCH: 25.5 pg — ABNORMAL LOW (ref 26.0–34.0)
MCHC: 31.1 g/dL (ref 30.0–36.0)
MCV: 82 fL (ref 80.0–100.0)
Platelets: 240 10*3/uL (ref 150–400)
RBC: 5.88 MIL/uL — ABNORMAL HIGH (ref 4.22–5.81)
RDW: 22.9 % — ABNORMAL HIGH (ref 11.5–15.5)
WBC: 7.8 10*3/uL (ref 4.0–10.5)
nRBC: 0 % (ref 0.0–0.2)

## 2022-01-07 LAB — BASIC METABOLIC PANEL
Anion gap: 9 (ref 5–15)
BUN: 50 mg/dL — ABNORMAL HIGH (ref 6–20)
CO2: 26 mmol/L (ref 22–32)
Calcium: 8.8 mg/dL — ABNORMAL LOW (ref 8.9–10.3)
Chloride: 101 mmol/L (ref 98–111)
Creatinine, Ser: 3.41 mg/dL — ABNORMAL HIGH (ref 0.61–1.24)
GFR, Estimated: 20 mL/min — ABNORMAL LOW (ref >60)
Glucose, Bld: 135 mg/dL — ABNORMAL HIGH (ref 70–99)
Potassium: 3.6 mmol/L (ref 3.5–5.1)
Sodium: 136 mmol/L (ref 135–145)

## 2022-01-07 LAB — TROPONIN I (HIGH SENSITIVITY)
Troponin I (High Sensitivity): 64 ng/L — ABNORMAL HIGH (ref <18)
Troponin I (High Sensitivity): 56 ng/L — ABNORMAL HIGH (ref <18)

## 2022-01-07 MED ORDER — INSULIN ASPART 100 UNIT/ML IJ SOLN: 0.0000 [IU] | Freq: Every day | INTRAMUSCULAR | Status: DC

## 2022-01-07 MED ORDER — INSULIN ASPART 100 UNIT/ML IJ SOLN: 0.0000 [IU] | Freq: Three times a day (TID) | INTRAMUSCULAR | Status: DC

## 2022-01-07 MED ORDER — FUROSEMIDE 10 MG/ML IJ SOLN
80.0000 mg | Freq: Two times a day (BID) | INTRAMUSCULAR | Status: DC
  Administered 2022-01-07 – 2022-01-08 (×3): 80 mg via INTRAVENOUS
  Filled 2022-01-07 (×3): qty 8

## 2022-01-07 MED ORDER — DAPAGLIFLOZIN PROPANEDIOL 10 MG PO TABS
10.0000 mg | ORAL_TABLET | Freq: Every day | ORAL | Status: DC
  Filled 2022-01-07: qty 1

## 2022-01-07 NOTE — Progress Notes (Signed)
Pt refused blood sugar check this morning and stated "he will refuse all day."

## 2022-01-07 NOTE — Progress Notes (Signed)
Telemetry called stated that patient had a run of 7 beats of Vtach. This RN assessed patient. Patient was up at bedside. Using urinal. Asymptomatic. Steady on feet. Once positioned in bed, Patient remains in A-fib HR 103. Will continue to monitor.

## 2022-01-07 NOTE — Progress Notes (Signed)
Rounding Note    Patient Name: Hector [name] Date of Encounter: 01/07/2022  [place] Cardiologist: Ida Rogue, MD   Subjective   Patient seen on a.m. rounds.  Denies chest pain.  Continues to endorse shortness of breath, abdominal swelling, and peripheral edema.  -785 mls output in the last 24 hours with a weight of 115.5 kg this morning.  Inpatient Medications    Scheduled Meds:  allopurinol  50 mg Oral Daily   apixaban  5 mg Oral BID   atorvastatin  80 mg Oral Daily   carvedilol  6.25 mg Oral BID WC   ferrous gluconate  324 mg Oral Q breakfast   furosemide  40 mg Intravenous Q12H   levothyroxine  100 mcg Oral QAC breakfast   potassium chloride SA  20 mEq Oral Daily   Continuous Infusions:  PRN Meds: acetaminophen **OR** acetaminophen, ondansetron **OR** ondansetron (ZOFRAN) IV   Vital Signs    Vitals:   01/06/22 2200 01/07/22 0120 01/07/22 0255 01/07/22 0728  BP: 120/89 106/83 (!) 122/103 (!) 113/90  Pulse: 78 (!) 54 93 (!) 103  Resp: (!) 24 (!) 26 20 18   Temp: 98.1 F (36.7 C) 98.2 F (36.8 C) 98.2 F (36.8 C) 97.8 F (36.6 C)  TempSrc:    Oral  SpO2: 96% 94% 96% 96%  Weight:   115.5 kg   Height:   5\' 11"  (1.803 m)     Intake/Output Summary (Last 24 hours) at 01/07/2022 0808 Last data filed at 01/07/2022 5697 Gross per 24 hour  Intake 240 ml  Output 1025 ml  Net -785 ml      01/07/2022    2:55 AM 01/06/2022   10:13 AM 12/25/2021   11:14 AM  Last 3 Weights  Weight (lbs) 254 lb 11.2 oz 252 lb 3.3 oz 252 lb 3.2 oz  Weight (kg) 115.531 kg 114.4 kg 114.397 kg      Telemetry    Atrial fibrillation rate 80-115- Personally Reviewed  ECG    No new tracings- Personally Reviewed  Physical Exam   GEN: No acute distress.  Sitting up on the side of the bed watching TV Neck: No JVD appreciated Cardiac: Irregularly irregular, no murmurs, rubs, or gallops.  Respiratory: Diminished to auscultation bilaterally, respirations are  unlabored at rest on room air GI: Soft, nontender, obese, non-distended  MS: 1+ edema; No deformity. Neuro:  Nonfocal  Psych: Normal affect   Labs    High Sensitivity Troponin:   Recent Labs  Lab 01/06/22 1048 01/06/22 1610 01/07/22 0409 01/07/22 0513  TROPONINIHS 61* 71* 64* 56*     Chemistry Recent Labs  Lab 01/06/22 1048 01/07/22 0409  NA 137 136  K 4.7 3.6  CL 104 101  CO2 21* 26  GLUCOSE 108* 135*  BUN 41* 50*  CREATININE 3.10* 3.41*  CALCIUM 8.9 8.8*  PROT 7.1  --   ALBUMIN 3.6  --   AST 24  --   ALT 12  --   ALKPHOS 70  --   BILITOT 2.5*  --   GFRNONAA 23* 20*  ANIONGAP 12 9    Lipids No results for input(s): "CHOL", "TRIG", "HDL", "LABVLDL", "LDLCALC", "CHOLHDL" in the last 168 hours.  Hematology Recent Labs  Lab 01/06/22 1048 01/07/22 0409  WBC 8.2 7.8  RBC 5.63 5.88*  HGB 14.5 15.0  HCT 46.3 48.2  MCV 82.2 82.0  MCH 25.8* 25.5*  MCHC 31.3 31.1  RDW 22.9* 22.9*  PLT 235  240   Thyroid No results for input(s): "TSH", "FREET4" in the last 168 hours.  BNP Recent Labs  Lab 01/06/22 1048  BNP 1,949.1*    DDimer No results for input(s): "DDIMER" in the last 168 hours.   Radiology    CT CHEST WO CONTRAST  Result Date: 01/06/2022 CLINICAL DATA:  Atrial fibrillation, lower extremity edema, short of breath EXAM: CT CHEST WITHOUT CONTRAST TECHNIQUE: Multidetector CT imaging of the chest was performed following the standard protocol without IV contrast. RADIATION DOSE REDUCTION: This exam was performed according to the departmental dose-optimization program which includes automated exposure control, adjustment of the mA and/or kV according to patient size and/or use of iterative reconstruction technique. COMPARISON:  01/06/2022, 12/07/2021 FINDINGS: Cardiovascular: Unenhanced imaging demonstrates an enlarged heart without pericardial effusion. Normal caliber of the thoracic aorta. Atherosclerosis of the aorta and coronary vasculature. Evaluation of the  vascular lumen is limited without IV contrast. Mediastinum/Nodes: No enlarged mediastinal or axillary lymph nodes. Thyroid gland, trachea, and esophagus demonstrate no significant findings. Lungs/Pleura: Stable bilateral calcified granulomata. Mild subpleural interlobular septal thickening consistent with Kerley lines and mild volume overload. No airspace disease, effusion, or pneumothorax. Central airways are patent. Scattered areas of subpleural scarring are again seen within the right upper and bilateral lower lobes. Upper Abdomen: No acute abnormality. Musculoskeletal: No acute or destructive bony lesions. Hypertrophic changes at the left first costochondral junction likely account for the density reported on recent x-ray. Reconstructed images demonstrate no additional findings. IMPRESSION: 1. Cardiomegaly. 2. Mild subpleural interlobular septal thickening greatest at the bases, consistent with mild fluid overload. No overt edema. 3. Aortic Atherosclerosis (ICD10-I70.0). Coronary artery atherosclerosis. Electronically Signed   By: Randa Ngo M.D.   On: 01/06/2022 15:21   DG Chest Port 1 View  Result Date: 01/06/2022 CLINICAL DATA:  Shortness of breath EXAM: PORTABLE CHEST 1 VIEW COMPARISON:  12/07/2021 FINDINGS: Stable cardiomegaly. Pulmonary vascular congestion. Prominent perihilar and bibasilar interstitial markings. More focal opacity in the left upper lobe. No large pleural fluid collection. No pneumothorax. IMPRESSION: 1. Cardiomegaly with pulmonary vascular congestion and interstitial edema. 2. More focal opacity in the left upper lobe, which may reflect alveolar edema or superimposed pneumonia. Electronically Signed   By: Davina Poke D.O.   On: 01/06/2022 10:47    Cardiac Studies   Limited TTE 12/08/2021 1. Left ventricular ejection fraction, by estimation, is <20%. The left  ventricle has severely decreased function. The left ventricle has no  regional wall motion abnormalities. The  left ventricular internal cavity  size was mildly to moderately dilated.   2. Right ventricular systolic function is normal. The right ventricular  size is normal. There is mildly elevated pulmonary artery systolic  pressure.   3. Left atrial size was severely dilated.   4. Right atrial size was mildly dilated.   5. The mitral valve is normal in structure. Moderate to severe mitral  valve regurgitation. No evidence of mitral stenosis.   6. The aortic valve is normal in structure. Aortic valve regurgitation is  not visualized. No aortic stenosis is present.   7. Moderately dilated pulmonary artery.   8. The inferior vena cava is dilated in size with <50% respiratory  variability, suggesting right atrial pressure of 15 mmHg.   Patient Profile     56 y.o. 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  Assessment & Plan    Acute on chronic combined systolic and diastolic congestive heart failure/nonischemic cardiomyopathy -Presented with shortness of breath and hypoxia, abdominal swelling, and peripheral edema. -Continues with shortness of breath without hypoxia and is on room air this morning -BNP on arrival was 1949.1 -Limited echocardiogram completed in October 2023 revealed LVEF<20% -He has had multiple hospitalizations for heart failure exacerbation likely secondary to noncompliance -He is not a candidate for ACE/ARB/ARNI/MRA secondary to stage IV chronic kidney disease -Furosemide increased to 80 mg IV twice daily -Continued on carvedilol 6.25 mg twice daily -Continue with heart failure education -Patient was previously seen and evaluated by EP and was not deemed a  candidate for ablation or ICD insertion due to noncompliance -Continue with daily weights, I's and O's, and low-sodium diet  Persistent atrial fibrillation atrial flutter -Remains in atrial fibrillation -Continued on carvedilol 6.25 mg twice daily -Continue apixaban 5 mg twice daily for CHA2DS2-VASc score of at least 6 -Continue with cardiac monitoring -If he remains compliant with medications going forward the restoration of sinus rhythm should be considered. -With his cardiomyopathy he may require antiarrhythmic therapy to help achieve/maintain sinus rhythm  CKD stage IV -Serum creatinine 3.41 -Baseline serum creatinine 2.8-3.2 -Daily BMP -Monitor urine output -Monitor/trend/replete electrolytes as needed -Avoid nephrotoxic agents were able  Essential hypertension -Blood pressure 113/90 -He is continued on carvedilol and furosemide -Vital signs per unit protocol  Nonobstructive coronary artery disease -Remains chest pain-free -High-sensitivity troponins trended 02,54,27,06 -Likely demand ischemia secondary from atrial fibrillation RVR and heart failure exacerbation -Apixaban in lieu of aspirin -Continued on beta-blocker and statin therapies  Hyperlipidemia -Continued on statin therapy  Obstructive sleep apnea -Patient states he uses CPAP at home -Should be continued during hospitalization  Hypothyroidism -Continued on levothyroxine -TSH 3.714 -Management per primary team  Type 2 diabetes -A1c 7.2 on 11/19/2021 -Had previously been on Farxiga that was managed by his PCP -Management per primary team  Medical nonadherence -Continued education on importance of compliance with medical treatment and medications  For questions or updates, please contact [place] Please consult [email or web address] for contact info under        Signed, Dianna Ewald, NP  01/07/2022, 8:08 AM

## 2022-01-07 NOTE — Progress Notes (Signed)
Nutrition Brief Note  RD pulled to chart secondary to CHF.   Wt Readings from Last 15 Encounters:  01/07/22 115.5 kg  12/25/21 114.4 kg  12/17/21 116 kg  12/12/21 112.4 kg  11/27/21 113.4 kg  11/24/21 111.3 kg  10/20/21 108.5 kg  10/02/21 111 kg  09/10/21 121.2 kg  08/19/21 121.2 kg  07/29/21 123.4 kg  07/28/21 123.8 kg  05/23/21 126.2 kg  05/13/21 121.3 kg  05/06/21 123.8 kg   Pt with medical history significant for nonischemic cardiomyopathy with last known LVEF less than 20%, stage IV chronic kidney disease, hypertension, paroxysmal atrial fibrillation on chronic anticoagulation therapy, obesity hypothyroidism who presents for evaluation of increased abdominal girth, lower extremity swelling and exertional shortness of breath.   Pt admitted with CHF.   Reviewed I/O's: -685 ml x 24 hours  UOPO: 925 ml x 24 hours  RD provided "Heart Healthy, Consistent Carbohydrate Nutrition Therapy" handout from [facility] Nutrition Care Manual and attached to AVS/ discharge summary. RD also provided referral to [facility].   Medications reviewed and include lasix.   Lab Results  Component Value Date   HGBA1C 7.2 (H) 11/19/2021   PTA DM medications are 10 mg farxiga daily.   Labs reviewed: CBGS: 123 (inpatient orders for glycemic control are none).    Body mass index is 35.52 kg/m. Patient meets criteria for obesity, class II based on current BMI. Obesity is a complex, chronic medical condition that is optimally managed by a multidisciplinary care team. Weight loss is not an ideal goal for an acute inpatient hospitalization. However, if further work-up for obesity is warranted, consider outpatient referral to outpatient bariatric service and/or [facility].    Current diet order is heart healthy/ carb modified with 1.5 L fluid restriction, patient is consuming approximately n/a% of meals at this time. Labs  and medications reviewed.   No nutrition interventions warranted at this time. If nutrition issues arise, please consult RD.   Loistine Chance, RD, LDN, [place] Registered Dietitian II Certified Diabetes Care and Education Specialist Please refer to [facility] for RD and/or RD on-call/weekend/after hours pager

## 2022-01-07 NOTE — Progress Notes (Signed)
[facility]  ROUNDING NOTE   Subjective:   Hector [name] is a 56 y.o. male with past medical conditions including hypertension, paroxysmal A-fib on Eliquis, CHF with a EF less than 20%, and chronic kidney disease stage IV.  Patient presents to the emergency department with shortness of breath and increased lower extremity edema. Patient has been admitted for Acute respiratory failure with hypoxia ([place]) [J96.01] Atrial fibrillation with RVR ([place]) [I48.91] Acute on chronic combined systolic (congestive) and diastolic (congestive) heart failure (HCC) [I50.43] Acute on chronic congestive heart failure, unspecified heart failure type [facility]) [I50.9]  Patient is known to our clinic from previous admissions.  He is followed outpatient by [facility] nephrology.  He has known to have decreased EF with diuretic therapy outpatient.  Patient states lower extremity edema has gradually worsened.  Denies shortness of breath, remains on room air.  States abdomen distended.  Denies nausea, vomiting, or diarrhea.  Labs on ED arrival include sodium 137, BUN 41, creatinine 3.10 with GFR 23, BNP greater than 1900, troponin 61.  CT chest shows mild fluid overload.  Chest x-ray shows pulmonary vascular congestion with interstitial edema.  We have been consulted to monitor acute kidney injury.   Objective:  Vital signs in last 24 hours:  Temp:  [97.8 F (36.6 C)-98.4 F (36.9 C)] 98.4 F (36.9 C) (11/15 1145) Pulse Rate:  [54-125] 86 (11/15 1145) Resp:  [16-26] 18 (11/15 1145) BP: (106-122)/(83-103) 107/87 (11/15 1145) SpO2:  [94 %-97 %] 96 % (11/15 1145) Weight:  [115.5 kg] 115.5 kg (11/15 0255)  Weight change:  Filed Weights   01/06/22 1013 01/07/22 0255  Weight: 114.4 kg 115.5 kg    Intake/Output: I/O last 3 completed shifts: In: 240 [P.O.:240] Out: 925 [Urine:925]   Intake/Output this shift:  Total I/O In: -  Out: 825 [Urine:825]  Physical Exam: General: NAD  Head:  Normocephalic, atraumatic. Moist oral mucosal membranes  Eyes: Anicteric  Lungs:  Clear to auscultation, normal effort, room air  Heart: Regular rate and rhythm  Abdomen:  Soft, nontender, distended  Extremities: 1+ peripheral edema.  Neurologic: Nonfocal, moving all four extremities  Skin: No lesions  Access: None    Basic Metabolic Panel: Recent Labs  Lab 01/06/22 1048 01/07/22 0409  NA 137 136  K 4.7 3.6  CL 104 101  CO2 21* 26  GLUCOSE 108* 135*  BUN 41* 50*  CREATININE 3.10* 3.41*  CALCIUM 8.9 8.8*    Liver Function Tests: Recent Labs  Lab 01/06/22 1048  AST 24  ALT 12  ALKPHOS 70  BILITOT 2.5*  PROT 7.1  ALBUMIN 3.6   No results for input(s): "LIPASE", "AMYLASE" in the last 168 hours. No results for input(s): "AMMONIA" in the last 168 hours.  CBC: Recent Labs  Lab 01/06/22 1048 01/07/22 0409  WBC 8.2 7.8  HGB 14.5 15.0  HCT 46.3 48.2  MCV 82.2 82.0  PLT 235 240    Cardiac Enzymes: No results for input(s): "CKTOTAL", "CKMB", "CKMBINDEX", "TROPONINI" in the last 168 hours.  BNP: Invalid input(s): "POCBNP"  CBG: No results for input(s): "GLUCAP" in the last 168 hours.  Microbiology: Results for orders placed or performed during the hospital encounter of 10/13/21  Resp Panel by RT-PCR (Flu A&B, Covid) Anterior Nasal Swab     Status: None   Collection Time: 10/13/21  3:50 PM   Specimen: Anterior Nasal Swab  Result Value Ref Range Status   SARS Coronavirus 2 by RT PCR NEGATIVE NEGATIVE Final  Comment: (NOTE) SARS-CoV-2 target nucleic acids are NOT DETECTED.  The SARS-CoV-2 RNA is generally detectable in upper respiratory specimens during the acute phase of infection. The lowest concentration of SARS-CoV-2 viral copies this assay can detect is 138 copies/mL. A negative result does not preclude SARS-Cov-2 infection and should not be used as the sole basis for treatment or other patient management decisions. A negative result may occur with   improper specimen collection/handling, submission of specimen other than nasopharyngeal swab, presence of viral mutation(s) within the areas targeted by this assay, and inadequate number of viral copies(<138 copies/mL). A negative result must be combined with clinical observations, patient history, and epidemiological information. The expected result is Negative.  Fact Sheet for Patients:  EntrepreneurPulse.com.au  Fact Sheet for Healthcare Providers:  IncredibleEmployment.be  This test is no t yet approved or cleared by the [place] FDA and  has been authorized for detection and/or diagnosis of SARS-CoV-2 by FDA under an Emergency Use Authorization (EUA). This EUA will remain  in effect (meaning this test can be used) for the duration of the COVID-19 declaration under Section 564(b)(1) of the Act, 21 U.S.C.section 360bbb-3(b)(1), unless the authorization is terminated  or revoked sooner.       Influenza A by PCR NEGATIVE NEGATIVE Final   Influenza B by PCR NEGATIVE NEGATIVE Final    Comment: (NOTE) The Xpert Xpress SARS-CoV-2/FLU/RSV plus assay is intended as an aid in the diagnosis of influenza from Nasopharyngeal swab specimens and should not be used as a sole basis for treatment. Nasal washings and aspirates are unacceptable for Xpert Xpress SARS-CoV-2/FLU/RSV testing.  Fact Sheet for Patients: EntrepreneurPulse.com.au  Fact Sheet for Healthcare Providers: IncredibleEmployment.be  This test is not yet approved or cleared by the [place] FDA and has been authorized for detection and/or diagnosis of SARS-CoV-2 by FDA under an Emergency Use Authorization (EUA). This EUA will remain in effect (meaning this test can be used) for the duration of the COVID-19 declaration under Section 564(b)(1) of the Act, 21 U.S.C. section 360bbb-3(b)(1), unless the authorization is terminated  or revoked.  Performed at [facility], [address] [place], [place] [address]     Coagulation Studies: No results for input(s): "LABPROT", "INR" in the last 72 hours.  Urinalysis: No results for input(s): "COLORURINE", "LABSPEC", "PHURINE", "GLUCOSEU", "HGBUR", "BILIRUBINUR", "KETONESUR", "PROTEINUR", "UROBILINOGEN", "NITRITE", "LEUKOCYTESUR" in the last 72 hours.  Invalid input(s): "APPERANCEUR"    Imaging: CT CHEST WO CONTRAST  Result Date: 01/06/2022 CLINICAL DATA:  Atrial fibrillation, lower extremity edema, short of breath EXAM: CT CHEST WITHOUT CONTRAST TECHNIQUE: Multidetector CT imaging of the chest was performed following the standard protocol without IV contrast. RADIATION DOSE REDUCTION: This exam was performed according to the departmental dose-optimization program which includes automated exposure control, adjustment of the mA and/or kV according to patient size and/or use of iterative reconstruction technique. COMPARISON:  01/06/2022, 12/07/2021 FINDINGS: Cardiovascular: Unenhanced imaging demonstrates an enlarged heart without pericardial effusion. Normal caliber of the thoracic aorta. Atherosclerosis of the aorta and coronary vasculature. Evaluation of the vascular lumen is limited without IV contrast. Mediastinum/Nodes: No enlarged mediastinal or axillary lymph nodes. Thyroid gland, trachea, and esophagus demonstrate no significant findings. Lungs/Pleura: Stable bilateral calcified granulomata. Mild subpleural interlobular septal thickening consistent with Kerley lines and mild volume overload. No airspace disease, effusion, or pneumothorax. Central airways are patent. Scattered areas of subpleural scarring are again seen within the right upper and bilateral lower lobes. Upper Abdomen: No acute abnormality. Musculoskeletal: No acute or destructive  bony lesions. Hypertrophic changes at the left first costochondral junction likely account for the density reported on  recent x-ray. Reconstructed images demonstrate no additional findings. IMPRESSION: 1. Cardiomegaly. 2. Mild subpleural interlobular septal thickening greatest at the bases, consistent with mild fluid overload. No overt edema. 3. Aortic Atherosclerosis (ICD10-I70.0). Coronary artery atherosclerosis. Electronically Signed   By: Randa Ngo M.D.   On: 01/06/2022 15:21   DG Chest Port 1 View  Result Date: 01/06/2022 CLINICAL DATA:  Shortness of breath EXAM: PORTABLE CHEST 1 VIEW COMPARISON:  12/07/2021 FINDINGS: Stable cardiomegaly. Pulmonary vascular congestion. Prominent perihilar and bibasilar interstitial markings. More focal opacity in the left upper lobe. No large pleural fluid collection. No pneumothorax. IMPRESSION: 1. Cardiomegaly with pulmonary vascular congestion and interstitial edema. 2. More focal opacity in the left upper lobe, which may reflect alveolar edema or superimposed pneumonia. Electronically Signed   By: Davina Poke D.O.   On: 01/06/2022 10:47     Medications:     allopurinol  50 mg Oral Daily   apixaban  5 mg Oral BID   atorvastatin  80 mg Oral Daily   carvedilol  6.25 mg Oral BID WC   ferrous gluconate  324 mg Oral Q breakfast   furosemide  80 mg Intravenous BID   levothyroxine  100 mcg Oral QAC breakfast   potassium chloride SA  20 mEq Oral Daily   acetaminophen **OR** acetaminophen, ondansetron **OR** ondansetron (ZOFRAN) IV  Assessment/ Plan:  [name] is a 56 y.o.  male past medical conditions including hypertension, paroxysmal A-fib on Eliquis, CHF with a EF less than 20%, and chronic kidney disease stage IV.  Patient presents to the emergency department with shortness of breath and increased lower extremity edema. Patient has been admitted for Acute respiratory failure with hypoxia ([place]) [J96.01] Atrial fibrillation with RVR ([place]) [I48.91] Acute on chronic combined systolic (congestive) and diastolic (congestive) heart failure (HCC)  [I50.43] Acute on chronic congestive heart failure, unspecified heart failure type ([place]) [I50.9]   Acute Kidney Injury on chronic kidney disease stage IV with baseline creatinine 2.81 and GFR of 25 on 12/12/2021.  Acute kidney injury secondary to cardiorenal syndrome.  Weight during last outpatient nephrology visit on 06/11/2021 was 126 kg.  Patient admitted at 214 kg.  Due to mild lower extremity edema and clear breath sounds, would agree to continue IV furosemide today and transition to oral diuretics tomorrow.   Lab Results  Component Value Date   CREATININE 3.41 (H) 01/07/2022   CREATININE 3.10 (H) 01/06/2022   CREATININE 2.81 (H) 12/12/2021    Intake/Output Summary (Last 24 hours) at 01/07/2022 1403 Last data filed at 01/07/2022 1333 Gross per 24 hour  Intake 240 ml  Output 1750 ml  Net -1510 ml   2. Anemia of chronic kidney disease Lab Results  Component Value Date   HGB 15.0 01/07/2022    Hemoglobin above desired target.  3. Chronic systolic and diastolic heart failure.  Echo from 12/08/2021 shows EF less than 20% with a moderate to severe mitral valve regurgitation.  Continue IV furosemide 80 mg twice daily and consider transitioning to oral diuresis tomorrow.  4. 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 Home regimen includes [place], held. Most recent hemoglobin A1c is 7.2 on 11/19/21.    LOS: 1   11/15/20232:03 PM

## 2022-01-07 NOTE — Consult Note (Signed)
Heart Failure Nurse Navigator Note  Presented to the emergency room with complaints of worsening shortness of breath, increasing abdominal girth and lower extremity edema.  HFrEF < 20%.  The left ventricle internal cavity was mildly to moderately dilated.  Mildly elevated pulmonary artery pressures.  Severely dilated left atria.  Mildly dilated right atria.  Moderate to severe mitral regurgitation.  Moderately dilated pulmonary artery.    Comorbidities:  Chronic kidney disease stage IV Hypertension Paroxysmal atrial fibrillation on NOAC Obesity Hypothyroidism Medication and diet noncompliance  Medications:  Potassium 20 mEq daily Apixaban 5 mg 2 times a day Atorvastatin 80 mg daily Carvedilol 6.25 mg daily with meals Lasix 80 mg IV 2 times a day Levothyroxine 100 mcg before breakfast   Labs:  Sodium 136, potassium 3.6, chloride 101, CO2 26, BUN 50, creatinine 3.41 Intake 240 mL Output 925 mL Weight 115.5 kg  Initial meeting with patient on this admission, upon entering the room patient was lying in bed with his eyes closed but he did open his eyes and would answer questions.  Eye contact was very poor.  He eventually sat up on the edge of the bed and would talk but there again the eye contact was poor.  When questioned he states that he was weighing himself every day but he had not seen a weight gain.  He states that he been compliant with his medications taking the torsemide twice a day.  He states that he has a weekly pillbox but that he does not use it.  Discussed  weight changes and changes in symptoms such as increased abdominal girth, lower extremity edema to be notifying Otila Kluver or Kristi  with para medicine help avoid these readmissions.  Also asked after he has been in the hospital if he checks his AVS with his medication bottles, he states no that he just goes off what the bottle says.  Discussed diet, he states that he has been eating Brendolyn Patty, [employer], and  pizza.  Went over the relationship between salt/sodium and fluids.  Went over fluid restriction, he states he drinks 216 ounce bottles of water, 32 ounces of Coca-Cola daily.  He has follow-up in the outpatient heart failure clinic on November 28 at 4 PM.  A 10% no-show.  Pricilla Riffle RN CHFN

## 2022-01-07 NOTE — Hospital Course (Addendum)
Confirm H&P.  Hector [name] is a 56 y.o. male with medical history significant for nonischemic cardiomyopathy with last known LVEF less than 20%, stage IV chronic kidney disease, hypertension, paroxysmal atrial fibrillation on chronic anticoagulation therapy, obesity hypothyroidism who presents to the ER for evaluation of increased abdominal girth, lower extremity swelling and exertional Shortness of Breath. Patient states that he has been compliant with his medications but admits to dietary indiscretion.  He feels that he has gained weight but is unable to tell me how much He has had several hospitalizations in the last 2 months for acute CHF and was recently discharged on 12/12/21.  ED course.  Found to be in A-fib with RVR hypoxic requiring up to 4 L of oxygen.  Labs pertinent for BNP 1949, troponin 61 Chest x-ray with cardiomegaly and pulmonary vascular congestion, more focal opacities in the left upper lobe may reflect alveolar edema or superimposed pneumonia. Patient received 80 mg of IV Lasix and admitted for further management.  11/15: Hemodynamically stable, ongoing mania, still feeling short of breath, no significant peripheral edema, mildly distended abdomen.  Creatinine bumped to 3.41 after getting IV Lasix yesterday.  Per cardiology might be due to cardiorenal, not much significant urinary output recorded, they increased the dose of IV Lasix to 80 mg twice daily. Nephrology is also on board.  11/16: Hemodynamically stable, renal function seems stable with urinary output of 2400 after increasing the dose of Lasix.  Weight decreased to 248lbs. cardiology would like to continue IV diuresis for another day.  Patient will be transition to torsemide 40 mg twice daily as recommended by nephrology on discharge.  Might get benefit from follow-up at advance heart failure clinic with Dr. Haroldine Laws.    11/17: Dr. Haroldine Laws saw him today recommending continuation of IV diuretics to make him dry  as much as possible, most likely had end-stage heart failure, he was also recommending ZIO monitor to watch for heart rate and A-fib.  Can consider starting amiodarone.  Should go back on Farxiga on discharge. Renal functions with slight improvement today, urinary output of 3600 recorded with net negative of -5 L.  Weight seems stable at 248.  Patient is high risk for mortality based on life limiting underlying comorbidities with advanced heart and renal failure.

## 2022-01-07 NOTE — TOC Initial Note (Signed)
Transition of Care [facility]) - Initial/Assessment Note    Patient Details  Name: Hector [name] MRN: [id number] Date of Birth: 1965-06-17  Transition of Care [facility]) CM/SW Contact:    Tiburcio Bash, LCSW Phone Number: 01/07/2022, 10:00 AM  Clinical Narrative:                   Readmission prevention screen complete. Patient known to [facility] due to recent readmission.    PCP is Rutherford Guys, MD at [facility]. Family/friends transport him to appointments.   Patient uses the [employer]. If on the weekend, he uses [employer]. No issues obtaining medications.   Patient lives with his brother. No home health or DME use prior to admission. No further concerns.  Brother will likely transport him home at discharge.           Expected Discharge Plan: [place] Barriers to Discharge: Continued Medical Work up   Patient Goals and CMS Choice Patient states their goals for this hospitalization and ongoing recovery are:: to go home CMS Medicare.gov Compare Post Acute Care list provided to:: Patient Choice offered to / list presented to : Patient  Expected Discharge Plan and Services Expected Discharge Plan: [place]       Living arrangements for the past 2 months: Single Family Home                                      Prior Living Arrangements/Services Living arrangements for the past 2 months: Single Family Home Lives with:: Self                   Activities of Daily Living Home Assistive Devices/Equipment: None ADL Screening (condition at time of admission) Patient's cognitive ability adequate to safely complete daily activities?: Yes Is the patient deaf or have difficulty hearing?: No Does the patient have difficulty seeing, even when wearing glasses/contacts?: No Does the patient have difficulty concentrating, remembering, or making decisions?: No Patient able to express need for assistance with  ADLs?: Yes Does the patient have difficulty dressing or bathing?: No Independently performs ADLs?: Yes (appropriate for developmental age) Does the patient have difficulty walking or climbing stairs?: No Weakness of Legs: Both Weakness of Arms/Hands: None  Permission Sought/Granted                  Emotional Assessment              Admission diagnosis:  Acute respiratory failure with hypoxia ([place]) [J96.01] Atrial fibrillation with RVR ([place]) [I48.91] Acute on chronic combined systolic (congestive) and diastolic (congestive) heart failure ([place]) [I50.43] Acute on chronic congestive heart failure, unspecified heart failure type ([place]) [I50.9] Patient Active Problem List   Diagnosis Date Noted   Acute on chronic combined systolic (congestive) and diastolic (congestive) heart failure ([place]) 01/06/2022   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 ([place]) 12/10/2021   Pulmonary nodule 12/07/2021   Gout 11/18/2021   Atrial fibrillation with RVR ([place]) 10/13/2021   Obesity (BMI 30-39.9) 10/01/2021   Acute on chronic systolic (congestive) heart failure ([place]) 09/28/2021   Right upper quadrant pain 09/10/2021   Nausea and vomiting 09/10/2021   Atrial flutter with rapid ventricular response ([place]) 03/01/2021   Hypothyroidism 11/30/2020   Stroke ([place])    Alcohol abuse    CKD (chronic kidney disease), stage IV ([place])  Chronic systolic CHF (congestive heart failure) (HCC)    Iron deficiency anemia    CKD (chronic kidney disease) stage 4, GFR 15-29 ml/min ([place]) 07/21/2020   Acute renal failure superimposed on stage 3b chronic kidney disease ([place]) 12/31/2019   Acute on chronic systolic CHF (congestive heart failure) ([place]) 07/21/2016   Obstructive sleep apnea 04/21/2016   Nonischemic cardiomyopathy ([place]) 05/26/2015   Chronic combined systolic (congestive) and diastolic (congestive) heart failure ([place]) 04/01/2015   Essential hypertension 04/01/2015   Nonischemic cardiomyopathy: EF 20-25% 09/21/2014    Paroxysmal atrial fibrillation ([place]) 08/16/2014   Paroxysmal atrial flutter ([employer]) 08/14/2014    Class: Acute   Hypertensive heart disease    Mixed hyperlipidemia    PCP:  [name], MD Pharmacy:   [facility] [address]), [place] [address] [place]) [place] [address] Phone: [phone] Fax: [phone]     Social Determinants of Health (SDOH) Interventions    Readmission Risk Interventions    12/09/2021    1:09 PM 10/14/2021    1:24 PM 03/04/2021   10:16 AM  Readmission Risk Prevention Plan  Transportation Screening Complete Complete Complete  PCP or Specialist Appt within 3-5 Days  Complete Complete  HRI or [place]   Complete  Social Work Consult for [place] Planning/Counseling  Complete Complete  Palliative Care Screening  Not Applicable Not Applicable  Medication Review Press photographer) Complete Complete Complete  PCP or Specialist appointment within 3-5 days of discharge Complete    SW Recovery Care/Counseling Consult Complete    [place] Not Applicable

## 2022-01-07 NOTE — Discharge Instructions (Signed)

## 2022-01-07 NOTE — Progress Notes (Signed)
Progress Note   Patient: Hector [name]:[id number] DOB: 03-12-1965 DOA: 01/06/2022     1 DOS: the patient was seen and examined on 01/07/2022   Brief hospital course: Confirm H&P.  Hector [name] is a 56 y.o. male with medical history significant for nonischemic cardiomyopathy with last known LVEF less than 20%, stage IV chronic kidney disease, hypertension, paroxysmal atrial fibrillation on chronic anticoagulation therapy, obesity hypothyroidism who presents to the ER for evaluation of increased abdominal girth, lower extremity swelling and exertional Shortness of Breath. Patient states that he has been compliant with his medications but admits to dietary indiscretion.  He feels that he has gained weight but is unable to tell me how much He has had several hospitalizations in the last 2 months for acute CHF and was recently discharged on 12/12/21.  ED course.  Found to be in A-fib with RVR hypoxic requiring up to 4 L of oxygen.  Labs pertinent for BNP 1949, troponin 61 Chest x-ray with cardiomegaly and pulmonary vascular congestion, more focal opacities in the left upper lobe may reflect alveolar edema or superimposed pneumonia. Patient received 80 mg of IV Lasix and admitted for further management.  11/15: Hemodynamically stable, ongoing mania, still feeling short of breath, no significant peripheral edema, mildly distended abdomen.  Creatinine bumped to 3.41 after getting IV Lasix yesterday.  Per cardiology might be due to cardiorenal, not much significant urinary output recorded, they increased the dose of IV Lasix to 80 mg twice daily. Nephrology is also on board.  Patient is high risk for mortality based on life limiting underlying comorbidities with advanced heart and renal failure.  Assessment and Plan: * Acute on chronic combined systolic (congestive) and diastolic (congestive) heart failure ([place]) Patient presents for evaluation of increased abdominal girth, exertional  shortness of breath and lower extremity swelling. Also noted to be in rapid A-fib with a rapid ventricular rate His last known LVEF is less than 20% from a 2D echocardiogram which was done in 11/2021.  Patient received IV Lasix in ED followed by continuation of 40 mg twice daily. Worsening of renal function with not much urinary output recorded, might be due to cardiorenal. 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   Weight as of this encounter: 115.5 kg.   Complicates overall prognosis and care Lifestyle modification and exercise has been discussed with patient in detail   Subjective: Patient was seen and examined today.  Continued to feel little short of breath and weak, saturating well on room air.  Physical Exam: Vitals:   01/07/22 0120 01/07/22 0255 01/07/22 0728 01/07/22 1145  BP: 106/83 (!) 122/103 (!) 113/90 107/87  Pulse: (!) 54 93 (!) 103 86  Resp: (!) 26 20 18 18   Temp: 98.2 F (36.8 C) 98.2 F (36.8 C) 97.8 F (36.6 C) 98.4 F (36.9 C)  TempSrc:   Oral Oral  SpO2: 94% 96%  96% 96%  Weight:  115.5 kg    Height:  5\' 11"  (1.803 m)     General.  Obese gentleman, in no acute distress. Pulmonary.  Lungs clear bilaterally, normal respiratory effort. CV.  Regular rate and rhythm, no JVD, rub or murmur. Abdomen.  Soft, nontender, nondistended, BS positive. CNS.  Alert and  oriented .  No focal neurologic deficit. Extremities.  No edema, no cyanosis, pulses intact and symmetrical. Psychiatry.  Judgment and insight appears normal.   Data Reviewed: Prior data reviewed  Family Communication: Discussed with patient  Disposition: Status is: Inpatient Remains inpatient appropriate because: Severity of illness  Planned Discharge Destination: Home  ED prophylaxis.  Eliquis Time spent: 50 minutes  This record has been created using Systems analyst. Errors have been sought and corrected,but may not always be located. Such creation errors do not reflect on the standard of care.   Author: Lorella Nimrod, MD 01/07/2022 3:04 PM  For on call review [email or web address].

## 2022-01-08 ENCOUNTER — Ambulatory Visit: Payer: Medicaid Other | Admitting: Family

## 2022-01-08 DIAGNOSIS — I509 Heart failure, unspecified: Secondary | ICD-10-CM

## 2022-01-08 DIAGNOSIS — I5043 Acute on chronic combined systolic (congestive) and diastolic (congestive) heart failure: Secondary | ICD-10-CM | POA: Diagnosis not present

## 2022-01-08 DIAGNOSIS — E1122 Type 2 diabetes mellitus with diabetic chronic kidney disease: Secondary | ICD-10-CM

## 2022-01-08 DIAGNOSIS — J9601 Acute respiratory failure with hypoxia: Secondary | ICD-10-CM

## 2022-01-08 DIAGNOSIS — N184 Chronic kidney disease, stage 4 (severe): Secondary | ICD-10-CM

## 2022-01-08 LAB — BASIC METABOLIC PANEL
Anion gap: 11 (ref 5–15)
BUN: 50 mg/dL — ABNORMAL HIGH (ref 6–20)
CO2: 25 mmol/L (ref 22–32)
Calcium: 9 mg/dL (ref 8.9–10.3)
Chloride: 102 mmol/L (ref 98–111)
Creatinine, Ser: 3.46 mg/dL — ABNORMAL HIGH (ref 0.61–1.24)
GFR, Estimated: 20 mL/min — ABNORMAL LOW (ref >60)
Glucose, Bld: 122 mg/dL — ABNORMAL HIGH (ref 70–99)
Potassium: 3.6 mmol/L (ref 3.5–5.1)
Sodium: 138 mmol/L (ref 135–145)

## 2022-01-08 LAB — HEMOGLOBIN A1C
Hgb A1c MFr Bld: 6.8 % — ABNORMAL HIGH (ref 4.8–5.6)
Mean Plasma Glucose: 148.46 mg/dL

## 2022-01-08 MED ORDER — TORSEMIDE 20 MG PO TABS: 40.0000 mg | ORAL_TABLET | Freq: Two times a day (BID) | ORAL | Status: DC

## 2022-01-08 MED ORDER — FUROSEMIDE 10 MG/ML IJ SOLN
80.0000 mg | Freq: Two times a day (BID) | INTRAMUSCULAR | Status: DC
  Administered 2022-01-08 – 2022-01-11 (×6): 80 mg via INTRAVENOUS
  Filled 2022-01-08 (×6): qty 8

## 2022-01-08 NOTE — Progress Notes (Addendum)
   Heart Failure Nurse Navigator Note  Met with patient today at lunch.  He is sitting up on the side of the bed eating.  Had tried on 2 other attempts to speak with him this morning, asleep on his side and did not arouse to his name being called.  Spoke with Ignacia Bayley this AM, questioning if seeing Dr. Missy Sabins would be beneficial and also placement of CardioMEMS, (although question being compliant with that.)  He states he has notified Dr. Missy Sabins  about the patient.  Discussed his diet of which he tells me he consumes Who[place] from [place] and hamburgers from [place].  Shown examples of the sodium content in those.  Went over again the relationship with sodium and fluids.  He states "he is not going to go on any diet, he is going to eat what he wants.  Same with fluid restriction.  He states he is happy with his life and is going to continue doing what he is doing and if he dies so be it."  Discussed frequent hospitalizations, question if he would not rather be at home. Doing the things he can to keep him out of the hospital. He again states he is happy with life the way it is. Talked about these exacerbations on the stress it puts on his body.   Shown diagrams of normal heart and what his heart looks like with the EF < 20%.  He did stop and spent time looking at the pictures. Had no questions or anything to offer.  Offered having his medications set up in medication weekly planner but he states he can do his own.  He does not feel he needs assistance from [place] with para medicine.  He states the doctor was going to send him home but he(the patient)  felt he was not ready.  He had no further questions or concerns.  Pricilla Riffle RN CHFN

## 2022-01-08 NOTE — Plan of Care (Signed)
  Problem: Education: Goal: Ability to demonstrate management of disease process will improve Outcome: Progressing Goal: Ability to verbalize understanding of medication therapies will improve Outcome: Progressing Goal: Individualized Educational Video(s) Outcome: Progressing   Problem: Activity: Goal: Capacity to carry out activities will improve Outcome: Progressing   Problem: Cardiac: Goal: Ability to achieve and maintain adequate cardiopulmonary perfusion will improve Outcome: Progressing   Problem: Education: Goal: Knowledge of General Education information will improve Description: Including pain rating scale, medication(s)/side effects and non-pharmacologic comfort measures Outcome: Progressing   Problem: Health Behavior/Discharge Planning: Goal: Ability to manage health-related needs will improve Outcome: Progressing   Problem: Clinical Measurements: Goal: Ability to maintain clinical measurements within normal limits will improve Outcome: Progressing Goal: Will remain free from infection Outcome: Progressing Goal: Diagnostic test results will improve Outcome: Progressing Goal: Respiratory complications will improve Outcome: Progressing Goal: Cardiovascular complication will be avoided Outcome: Progressing   Problem: Activity: Goal: Risk for activity intolerance will decrease Outcome: Progressing   Problem: Nutrition: Goal: Adequate nutrition will be maintained Outcome: Progressing   Problem: Coping: Goal: Level of anxiety will decrease Outcome: Progressing   Problem: Elimination: Goal: Will not experience complications related to bowel motility Outcome: Progressing Goal: Will not experience complications related to urinary retention Outcome: Progressing   Problem: Pain Managment: Goal: General experience of comfort will improve Outcome: Progressing   Problem: Safety: Goal: Ability to remain free from injury will improve Outcome: Progressing    Problem: Skin Integrity: Goal: Risk for impaired skin integrity will decrease Outcome: Progressing   Problem: Education: Goal: Ability to describe self-care measures that may prevent or decrease complications (Diabetes Survival Skills Education) will improve Outcome: Progressing Goal: Individualized Educational Video(s) Outcome: Progressing   Problem: Coping: Goal: Ability to adjust to condition or change in health will improve Outcome: Progressing   Problem: Fluid Volume: Goal: Ability to maintain a balanced intake and output will improve Outcome: Progressing   Problem: Health Behavior/Discharge Planning: Goal: Ability to identify and utilize available resources and services will improve Outcome: Progressing Goal: Ability to manage health-related needs will improve Outcome: Progressing   Problem: Metabolic: Goal: Ability to maintain appropriate glucose levels will improve Outcome: Progressing   Problem: Nutritional: Goal: Maintenance of adequate nutrition will improve Outcome: Progressing Goal: Progress toward achieving an optimal weight will improve Outcome: Progressing   Problem: Skin Integrity: Goal: Risk for impaired skin integrity will decrease Outcome: Progressing   Problem: Tissue Perfusion: Goal: Adequacy of tissue perfusion will improve Outcome: Progressing   

## 2022-01-08 NOTE — Progress Notes (Signed)
[facility]  ROUNDING NOTE   Subjective:   Hector [name] is a 56 y.o. male with past medical conditions including hypertension, paroxysmal A-fib on Eliquis, CHF with a EF less than 20%, and chronic kidney disease stage IV.  Patient presents to the emergency department with shortness of breath and increased lower extremity edema. Patient has been admitted for Acute respiratory failure with hypoxia ([place]) [J96.01] Atrial fibrillation with RVR ([place]) [I48.91] Acute on chronic combined systolic (congestive) and diastolic (congestive) heart failure (HCC) [I50.43] Acute on chronic congestive heart failure, unspecified heart failure type [facility]) [I50.9]  Patient is known to our clinic from previous admissions.  He is followed outpatient by [facility] nephrology.    Patient sitting at side of bed Alert and oriented States he feels well today, not ready for discharge Tolerating meals Lower extremity edema improved   Objective:  Vital signs in last 24 hours:  Temp:  [98.1 F (36.7 C)-98.8 F (37.1 C)] 98.1 F (36.7 C) (11/16 0818) Pulse Rate:  [60-121] 105 (11/16 0818) Resp:  [11-19] 19 (11/16 0818) BP: (97-118)/(77-96) 118/96 (11/16 0818) SpO2:  [93 %-98 %] 98 % (11/16 0818) Weight:  [112.8 kg] 112.8 kg (11/16 0500)  Weight change: -1.6 kg Filed Weights   01/06/22 1013 01/07/22 0255 01/08/22 0500  Weight: 114.4 kg 115.5 kg 112.8 kg    Intake/Output: I/O last 3 completed shifts: In: 78 [P.O.:960] Out: 2700 [Urine:2700]   Intake/Output this shift:  Total I/O In: 240 [P.O.:240] Out: 400 [Urine:400]  Physical Exam: General: NAD  Head: Normocephalic, atraumatic. Moist oral mucosal membranes  Eyes: Anicteric  Lungs:  Clear to auscultation, normal effort, room air  Heart: Regular rate and rhythm  Abdomen:  Soft, nontender, distended  Extremities: trace peripheral edema.  Neurologic: Nonfocal, moving all four extremities  Skin: No lesions  Access: None    Basic  Metabolic Panel: Recent Labs  Lab 01/06/22 1048 01/07/22 0409 01/08/22 0554  NA 137 136 138  K 4.7 3.6 3.6  CL 104 101 102  CO2 21* 26 25  GLUCOSE 108* 135* 122*  BUN 41* 50* 50*  CREATININE 3.10* 3.41* 3.46*  CALCIUM 8.9 8.8* 9.0     Liver Function Tests: Recent Labs  Lab 01/06/22 1048  AST 24  ALT 12  ALKPHOS 70  BILITOT 2.5*  PROT 7.1  ALBUMIN 3.6    No results for input(s): "LIPASE", "AMYLASE" in the last 168 hours. No results for input(s): "AMMONIA" in the last 168 hours.  CBC: Recent Labs  Lab 01/06/22 1048 01/07/22 0409  WBC 8.2 7.8  HGB 14.5 15.0  HCT 46.3 48.2  MCV 82.2 82.0  PLT 235 240     Cardiac Enzymes: No results for input(s): "CKTOTAL", "CKMB", "CKMBINDEX", "TROPONINI" in the last 168 hours.  BNP: Invalid input(s): "POCBNP"  CBG: No results for input(s): "GLUCAP" in the last 168 hours.  Microbiology: Results for orders placed or performed during the hospital encounter of 10/13/21  Resp Panel by RT-PCR (Flu A&B, Covid) Anterior Nasal Swab     Status: None   Collection Time: 10/13/21  3:50 PM   Specimen: Anterior Nasal Swab  Result Value Ref Range Status   SARS Coronavirus 2 by RT PCR NEGATIVE NEGATIVE Final    Comment: (NOTE) SARS-CoV-2 target nucleic acids are NOT DETECTED.  The SARS-CoV-2 RNA is generally detectable in upper respiratory specimens during the acute phase of infection. The lowest concentration of SARS-CoV-2 viral copies this assay can detect is 138 copies/mL. A  negative result does not preclude SARS-Cov-2 infection and should not be used as the sole basis for treatment or other patient management decisions. A negative result may occur with  improper specimen collection/handling, submission of specimen other than nasopharyngeal swab, presence of viral mutation(s) within the areas targeted by this assay, and inadequate number of viral copies(<138 copies/mL). A negative result must be combined with clinical  observations, patient history, and epidemiological information. The expected result is Negative.  Fact Sheet for Patients:  EntrepreneurPulse.com.au  Fact Sheet for Healthcare Providers:  IncredibleEmployment.be  This test is no t yet approved or cleared by the [place] FDA and  has been authorized for detection and/or diagnosis of SARS-CoV-2 by FDA under an Emergency Use Authorization (EUA). This EUA will remain  in effect (meaning this test can be used) for the duration of the COVID-19 declaration under Section 564(b)(1) of the Act, 21 U.S.C.section 360bbb-3(b)(1), unless the authorization is terminated  or revoked sooner.       Influenza A by PCR NEGATIVE NEGATIVE Final   Influenza B by PCR NEGATIVE NEGATIVE Final    Comment: (NOTE) The Xpert Xpress SARS-CoV-2/FLU/RSV plus assay is intended as an aid in the diagnosis of influenza from Nasopharyngeal swab specimens and should not be used as a sole basis for treatment. Nasal washings and aspirates are unacceptable for Xpert Xpress SARS-CoV-2/FLU/RSV testing.  Fact Sheet for Patients: EntrepreneurPulse.com.au  Fact Sheet for Healthcare Providers: IncredibleEmployment.be  This test is not yet approved or cleared by the [place] FDA and has been authorized for detection and/or diagnosis of SARS-CoV-2 by FDA under an Emergency Use Authorization (EUA). This EUA will remain in effect (meaning this test can be used) for the duration of the COVID-19 declaration under Section 564(b)(1) of the Act, 21 U.S.C. section 360bbb-3(b)(1), unless the authorization is terminated or revoked.  Performed at [facility], [address] [place], [place] [address]     Coagulation Studies: No results for input(s): "LABPROT", "INR" in the last 72 hours.  Urinalysis: No results for input(s): "COLORURINE", "LABSPEC", "PHURINE", "GLUCOSEU", "HGBUR",  "BILIRUBINUR", "KETONESUR", "PROTEINUR", "UROBILINOGEN", "NITRITE", "LEUKOCYTESUR" in the last 72 hours.  Invalid input(s): "APPERANCEUR"    Imaging: CT CHEST WO CONTRAST  Result Date: 01/06/2022 CLINICAL DATA:  Atrial fibrillation, lower extremity edema, short of breath EXAM: CT CHEST WITHOUT CONTRAST TECHNIQUE: Multidetector CT imaging of the chest was performed following the standard protocol without IV contrast. RADIATION DOSE REDUCTION: This exam was performed according to the departmental dose-optimization program which includes automated exposure control, adjustment of the mA and/or kV according to patient size and/or use of iterative reconstruction technique. COMPARISON:  01/06/2022, 12/07/2021 FINDINGS: Cardiovascular: Unenhanced imaging demonstrates an enlarged heart without pericardial effusion. Normal caliber of the thoracic aorta. Atherosclerosis of the aorta and coronary vasculature. Evaluation of the vascular lumen is limited without IV contrast. Mediastinum/Nodes: No enlarged mediastinal or axillary lymph nodes. Thyroid gland, trachea, and esophagus demonstrate no significant findings. Lungs/Pleura: Stable bilateral calcified granulomata. Mild subpleural interlobular septal thickening consistent with Kerley lines and mild volume overload. No airspace disease, effusion, or pneumothorax. Central airways are patent. Scattered areas of subpleural scarring are again seen within the right upper and bilateral lower lobes. Upper Abdomen: No acute abnormality. Musculoskeletal: No acute or destructive bony lesions. Hypertrophic changes at the left first costochondral junction likely account for the density reported on recent x-ray. Reconstructed images demonstrate no additional findings. IMPRESSION: 1. Cardiomegaly. 2. Mild subpleural interlobular septal thickening greatest at the bases, consistent with mild  fluid overload. No overt edema. 3. Aortic Atherosclerosis (ICD10-I70.0). Coronary artery  atherosclerosis. Electronically Signed   By: Randa Ngo M.D.   On: 01/06/2022 15:21     Medications:     allopurinol  50 mg Oral Daily   apixaban  5 mg Oral BID   atorvastatin  80 mg Oral Daily   carvedilol  6.25 mg Oral BID WC   ferrous gluconate  324 mg Oral Q breakfast   furosemide  80 mg Intravenous BID   insulin aspart  0-5 Units Subcutaneous QHS   insulin aspart  0-9 Units Subcutaneous TID WC   levothyroxine  100 mcg Oral QAC breakfast   potassium chloride SA  20 mEq Oral Daily   acetaminophen **OR** acetaminophen, ondansetron **OR** ondansetron (ZOFRAN) IV  Assessment/ Plan:  [name] is a 56 y.o.  male past medical conditions including hypertension, paroxysmal A-fib on Eliquis, CHF with a EF less than 20%, and chronic kidney disease stage IV.  Patient presents to the emergency department with shortness of breath and increased lower extremity edema. Patient has been admitted for Acute respiratory failure with hypoxia ([place]) [J96.01] Atrial fibrillation with RVR ([place]) [I48.91] Acute on chronic combined systolic (congestive) and diastolic (congestive) heart failure (HCC) [I50.43] Acute on chronic congestive heart failure, unspecified heart failure type ([place]) [I50.9]   Acute Kidney Injury on chronic kidney disease stage IV with baseline creatinine 2.81 and GFR of 25 on 12/12/2021.  Acute kidney injury secondary to cardiorenal syndrome.  Weight during last outpatient nephrology visit on 06/11/2021 was 126 kg.  Daily weight 112.8kg. Edema improved. Will transition to oral diuretics today, Torsemide 40mg  BID.    Lab Results  Component Value Date   CREATININE 3.46 (H) 01/08/2022   CREATININE 3.41 (H) 01/07/2022   CREATININE 3.10 (H) 01/06/2022    Intake/Output Summary (Last 24 hours) at 01/08/2022 1058 Last data filed at 01/08/2022 1056 Gross per 24 hour  Intake 960 ml  Output 2650 ml  Net -1690 ml    2. Anemia of chronic kidney disease Lab Results   Component Value Date   HGB 15.0 01/07/2022    Hemoglobin at goal  3. Chronic systolic and diastolic heart failure.  Echo from 12/08/2021 shows EF less than 20% with a moderate to severe mitral valve regurgitation.  Success diuresis on IV furosemide. Will transition to oral diuretics.   4. 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 Home regimen includes Wilder Glade, held. Most recent hemoglobin A1c is 7.2 on 11/19/21.    LOS: 2   11/16/202310:58 AM

## 2022-01-08 NOTE — Progress Notes (Signed)
Progress Note   Patient: Hector [name] RUE:[id number] DOB: 01-Mar-1965 DOA: 01/06/2022     2 DOS: the patient was seen and examined on 01/08/2022   Brief hospital course: Confirm H&P.  Hector [name] is a 56 y.o. male with medical history significant for nonischemic cardiomyopathy with last known LVEF less than 20%, stage IV chronic kidney disease, hypertension, paroxysmal atrial fibrillation on chronic anticoagulation therapy, obesity hypothyroidism who presents to the ER for evaluation of increased abdominal girth, lower extremity swelling and exertional Shortness of Breath. Patient states that he has been compliant with his medications but admits to dietary indiscretion.  He feels that he has gained weight but is unable to tell me how much He has had several hospitalizations in the last 2 months for acute CHF and was recently discharged on 12/12/21.  ED course.  Found to be in A-fib with RVR hypoxic requiring up to 4 L of oxygen.  Labs pertinent for BNP 1949, troponin 61 Chest x-ray with cardiomegaly and pulmonary vascular congestion, more focal opacities in the left upper lobe may reflect alveolar edema or superimposed pneumonia. Patient received 80 mg of IV Lasix and admitted for further management.  11/15: Hemodynamically stable, ongoing mania, still feeling short of breath, no significant peripheral edema, mildly distended abdomen.  Creatinine bumped to 3.41 after getting IV Lasix yesterday.  Per cardiology might be due to cardiorenal, not much significant urinary output recorded, they increased the dose of IV Lasix to 80 mg twice daily. Nephrology is also on board.  11/16: Hemodynamically stable, renal function seems stable with urinary output of 2400 after increasing the dose of Lasix.  Weight decreased to 248lbs. cardiology would like to continue IV diuresis for another day.  Patient will be transition to torsemide 40 mg twice daily as recommended by nephrology on discharge.   Might get benefit from follow-up at advance heart failure clinic with Dr. Haroldine Laws.    Patient is high risk for mortality based on life limiting underlying comorbidities with advanced heart and renal failure.  Assessment and Plan: * Acute on chronic combined systolic (congestive) and diastolic (congestive) heart failure ([place]) Patient presents for evaluation of increased abdominal girth, exertional shortness of breath and lower extremity swelling. Also noted to be in rapid A-fib with a rapid ventricular rate His last known LVEF is less than 20% from a 2D echocardiogram which was done in 11/2021.  Patient received IV Lasix in ED followed by continuation of 40 mg twice daily. Renal function seems stable with urinary output improved to 2400 after increasing the dose. -Cardiology would like to continue with IV Lasix 80 mg twice daily for another day -Continue with carvedilol -Daily BMP and weight -Strict intake and output  Atrial fibrillation with RVR (HCC) History of paroxysmal A-fib, going in and out of RVR Continue carvedilol for rate control Continue Eliquis as primary prophylaxis for an acute stroke  Hypothyroidism Continue Synthroid  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 ([place]) Patient was on Farxiga at home, had A1c at 7.7 checked in September 2023 -Continue Farxiga -SSI  Obesity (BMI 30-39.9) Estimated body mass index is 35.52 kg/m as calculated from the following:   Height as of this encounter: 5\' 11"  (1.803 m).   Weight as of this encounter: 115.5 kg.   Complicates overall prognosis and care Lifestyle modification and exercise has been discussed with patient in detail   Subjective: Patient was resting comfortably when seen today.  Denies any shortness of breath but continued to feel  very weak.  Physical Exam: Vitals:   01/08/22 0341 01/08/22 0500 01/08/22 0818 01/08/22 1158  BP: 99/77  (!) 118/96 (!) 118/99  Pulse: 100  (!) 105 83  Resp: 16  19 20   Temp: 98.3 F (36.8 C)   98.1 F (36.7 C) 98 F (36.7 C)  TempSrc: Oral  Oral   SpO2: 94%  98% 99%  Weight:  112.8 kg    Height:       General.  Obese gentleman, in no acute distress. Pulmonary.  Lungs clear bilaterally, normal respiratory effort. CV.  Regular rate and rhythm, no JVD, rub or murmur. Abdomen.  Soft, nontender, nondistended, BS positive. CNS.  Alert and oriented .  No focal neurologic deficit. Extremities.  No edema, no cyanosis, pulses intact and symmetrical. Psychiatry.  Judgment and insight appears normal.  Data Reviewed: Prior data reviewed  Family Communication: Discussed with patient  Disposition: Status is: Inpatient Remains inpatient appropriate because: Severity of illness  Planned Discharge Destination: Home  ED prophylaxis.  Eliquis Time spent: 45 minutes  This record has been created using Systems analyst. Errors have been sought and corrected,but may not always be located. Such creation errors do not reflect on the standard of care.   Author: Lorella Nimrod, MD 01/08/2022 3:46 PM  For on call review [email or web address].

## 2022-01-08 NOTE — Progress Notes (Signed)
Pt refused blood sugar check today. Stated, "I am not a diabetic, I don't need my sugar check."

## 2022-01-08 NOTE — Progress Notes (Signed)
Cardiology Progress Note   Patient Name: Hector [name] Date of Encounter: 01/08/2022  Primary Cardiologist: Ida Rogue, MD  Subjective   Breathing improved.  Lower ext edema resolved.  Still feels like abd has fluid, but is soft.  Doesn't feel ready for d/c.  Hasn't been ambulating in room or otw.  Inpatient Medications    Scheduled Meds:  allopurinol  50 mg Oral Daily   apixaban  5 mg Oral BID   atorvastatin  80 mg Oral Daily   carvedilol  6.25 mg Oral BID WC   ferrous gluconate  324 mg Oral Q breakfast   furosemide  80 mg Intravenous BID   insulin aspart  0-5 Units Subcutaneous QHS   insulin aspart  0-9 Units Subcutaneous TID WC   levothyroxine  100 mcg Oral QAC breakfast   potassium chloride SA  20 mEq Oral Daily   Continuous Infusions:  PRN Meds: acetaminophen **OR** acetaminophen, ondansetron **OR** ondansetron (ZOFRAN) IV   Vital Signs    Vitals:   01/08/22 0000 01/08/22 0341 01/08/22 0500 01/08/22 0818  BP:  99/77  (!) 118/96  Pulse:  100  (!) 105  Resp: 11 16  19   Temp:  98.3 F (36.8 C)  98.1 F (36.7 C)  TempSrc:  Oral  Oral  SpO2:  94%  98%  Weight:   112.8 kg   Height:        Intake/Output Summary (Last 24 hours) at 01/08/2022 0906 Last data filed at 01/08/2022 0340 Gross per 24 hour  Intake 720 ml  Output 2300 ml  Net -1580 ml   Filed Weights   01/06/22 1013 01/07/22 0255 01/08/22 0500  Weight: 114.4 kg 115.5 kg 112.8 kg    Physical Exam   GEN: Well nourished, well developed, in no acute distress.  HEENT: Grossly normal.  Neck: Supple, JVP ~ 12.  No carotid bruits or masses. Cardiac: IR, IR, no murmurs, rubs, or gallops. No clubbing, cyanosis, edema.  Radials 2+, DP/PT 2+ and equal bilaterally.  Respiratory:  Respirations regular and unlabored, clear to auscultation bilaterally. GI: Obese, soft, nontender, nondistended, BS + x 4. MS: no deformity or atrophy. Skin: warm and dry, no rash. Neuro:  Strength and sensation are  intact. Psych: AAOx3.  Normal affect.  Labs    Chemistry Recent Labs  Lab 01/06/22 1048 01/07/22 0409 01/08/22 0554  NA 137 136 138  K 4.7 3.6 3.6  CL 104 101 102  CO2 21* 26 25  GLUCOSE 108* 135* 122*  BUN 41* 50* 50*  CREATININE 3.10* 3.41* 3.46*  CALCIUM 8.9 8.8* 9.0  PROT 7.1  --   --   ALBUMIN 3.6  --   --   AST 24  --   --   ALT 12  --   --   ALKPHOS 70  --   --   BILITOT 2.5*  --   --   GFRNONAA 23* 20* 20*  ANIONGAP 12 9 11      Hematology Recent Labs  Lab 01/06/22 1048 01/07/22 0409  WBC 8.2 7.8  RBC 5.63 5.88*  HGB 14.5 15.0  HCT 46.3 48.2  MCV 82.2 82.0  MCH 25.8* 25.5*  MCHC 31.3 31.1  RDW 22.9* 22.9*  PLT 235 240    Cardiac Enzymes  Recent Labs  Lab 01/06/22 1048 01/06/22 1610 01/07/22 0409 01/07/22 0513  TROPONINIHS 61* 71* 64* 56*      BNP    Component Value Date/Time   BNP 1,949.1 (  H) 01/06/2022 1048    Lipids  Lab Results  Component Value Date   CHOL 89 02/03/2020   HDL 15 (L) 02/03/2020   LDLCALC 49 02/03/2020   TRIG 125 02/03/2020   CHOLHDL 5.9 02/03/2020    HbA1c  Lab Results  Component Value Date   HGBA1C 6.8 (H) 01/07/2022    Radiology    CT CHEST WO CONTRAST  Result Date: 01/06/2022 CLINICAL DATA:  Atrial fibrillation, lower extremity edema, short of breath EXAM: CT CHEST WITHOUT CONTRAST TECHNIQUE: Multidetector CT imaging of the chest was performed following the standard protocol without IV contrast. RADIATION DOSE REDUCTION: This exam was performed according to the departmental dose-optimization program which includes automated exposure control, adjustment of the mA and/or kV according to patient size and/or use of iterative reconstruction technique. COMPARISON:  01/06/2022, 12/07/2021 FINDINGS: Cardiovascular: Unenhanced imaging demonstrates an enlarged heart without pericardial effusion. Normal caliber of the thoracic aorta. Atherosclerosis of the aorta and coronary vasculature. Evaluation of the vascular  lumen is limited without IV contrast. Mediastinum/Nodes: No enlarged mediastinal or axillary lymph nodes. Thyroid gland, trachea, and esophagus demonstrate no significant findings. Lungs/Pleura: Stable bilateral calcified granulomata. Mild subpleural interlobular septal thickening consistent with Kerley lines and mild volume overload. No airspace disease, effusion, or pneumothorax. Central airways are patent. Scattered areas of subpleural scarring are again seen within the right upper and bilateral lower lobes. Upper Abdomen: No acute abnormality. Musculoskeletal: No acute or destructive bony lesions. Hypertrophic changes at the left first costochondral junction likely account for the density reported on recent x-ray. Reconstructed images demonstrate no additional findings. IMPRESSION: 1. Cardiomegaly. 2. Mild subpleural interlobular septal thickening greatest at the bases, consistent with mild fluid overload. No overt edema. 3. Aortic Atherosclerosis (ICD10-I70.0). Coronary artery atherosclerosis. Electronically Signed   By: Randa Ngo M.D.   On: 01/06/2022 15:21   DG Chest Port 1 View  Result Date: 01/06/2022 CLINICAL DATA:  Shortness of breath EXAM: PORTABLE CHEST 1 VIEW COMPARISON:  12/07/2021 FINDINGS: Stable cardiomegaly. Pulmonary vascular congestion. Prominent perihilar and bibasilar interstitial markings. More focal opacity in the left upper lobe. No large pleural fluid collection. No pneumothorax. IMPRESSION: 1. Cardiomegaly with pulmonary vascular congestion and interstitial edema. 2. More focal opacity in the left upper lobe, which may reflect alveolar edema or superimposed pneumonia. Electronically Signed   By: Davina Poke D.O.   On: 01/06/2022 10:47    Telemetry    Afib, 100-105, pvc's - Personally Reviewed  Cardiac Studies    Limited TTE 12/08/2021 1. Left ventricular ejection fraction, by estimation, is <20%. The left  ventricle has severely decreased function. The left  ventricle has no  regional wall motion abnormalities. The left ventricular internal cavity  size was mildly to moderately dilated.   2. Right ventricular systolic function is normal. The right ventricular  size is normal. There is mildly elevated pulmonary artery systolic  pressure.   3. Left atrial size was severely dilated.   4. Right atrial size was mildly dilated.   5. The mitral valve is normal in structure. Moderate to severe mitral  valve regurgitation. No evidence of mitral stenosis.   6. The aortic valve is normal in structure. Aortic valve regurgitation is  not visualized. No aortic stenosis is present.   7. Moderately dilated pulmonary artery.   8. The inferior vena cava is dilated in size with <50% respiratory  variability, suggesting right atrial pressure of 15 mmHg.   Patient Profile     56 y.o. male with a  history of chronic combined CHF/NICM likely secondary to alcohol use, hypertension, hyperlipidemia, paroxysmal atrial fibrillation/atrial flutter, type 2 diabetes, hypothyroidism, OSA on CPAP, CKD stage III-4, medication noncompliance, history of GI bleed on apixaban, possible sleep apnea, who was admitted 11/14 w/ recurrent CHF.  Assessment & Plan    1.  Acute on chronic combined syst/dias CHF/NICM:  EF < 20% by echo 11/2021 w/ mod-sev MR.  Presented 11/14 w/ dyspnea, hypoxia, abd swelling, and lower ext edema.  BNP 1949.1 on arrival.  Responding well to IV lasxi - minus 1.6L overnight and minus 2.3L since admission.  Wt down 2.7 kg since yesterday.  BUN stable @ 50.  Creat higher @ 3.46 (3.10  3.41  3.46).  3.46 is roughly in baseline range over the past year.  On exam, lower ext edema resolved.  He thinks he's holding onto volume in his abdomen, though it is soft.  Mildly elev JVP.  Wt currently at prev d/c wt in October.  Discussed w/ nephrology who plan to transition back to home dose of torsemide.  Follow today, ambulate.  Cont ? blocker.  With CKD IV, he is not a  candidate for acei/arb/arni/mra/sglt2i.  Compliance and outpt f/u of utmost importance in the setting of multiple readmission and low output CHF.  Will ask advanced CHF team to eval in AM.  2.  CKD IV:  nephrology following w/ plan to transition to oral torsemide today in light of improved volume and rising creat.  3.  Persistent Afib/Flutter:  Rate 100-105 on carvedilol 6.25mg  BID.  Prev noncompliant w/ this at home.  Limited ability to titrate due to soft BPs.  Could consider transitioning to Toprol XL, though not clear that this would provide better rate control.  [place] w/ eliquis.  4.  Essential HTN:  stable/soft.  5.  HL:  cont statin.  LDL 41 in 01/2020.  Needs f/u fasting panel.  Will order for AM.  6.  DMII:  per IM.  7.  OSA:  noncompliant w/ CPAP @ home.  8.  Demand ischemia:  mild HsTrop elev in setting of CHF.  Minimal CAD on caht in 2016.  No plans for further isch eval @ this time.  Signed, Murray Hodgkins, NP  01/08/2022, 9:06 AM    For questions or updates, please contact   Please consult [email or web address] for contact info under Cardiology/STEMI.

## 2022-01-08 NOTE — Progress Notes (Signed)
Pt refused HS CBG check.

## 2022-01-08 NOTE — Assessment & Plan Note (Addendum)
Patient presents for evaluation of increased abdominal girth, exertional shortness of breath and lower extremity swelling. Also noted to be in rapid A-fib with a rapid ventricular rate His last known LVEF is less than 20% from a 2D echocardiogram which was done in 11/2021.  Patient received IV Lasix in ED followed by continuation of 40 mg twice daily. Renal function with slight improvement.  UOP of 3600 -Advanced heart failure team evaluated him -Cardiology would like to continue with IV Lasix 80 mg twice daily for another day -Continue with carvedilol -Daily BMP and weight -Strict intake and output

## 2022-01-09 DIAGNOSIS — I5043 Acute on chronic combined systolic (congestive) and diastolic (congestive) heart failure: Secondary | ICD-10-CM | POA: Diagnosis not present

## 2022-01-09 LAB — BASIC METABOLIC PANEL
Anion gap: 9 (ref 5–15)
BUN: 52 mg/dL — ABNORMAL HIGH (ref 6–20)
CO2: 26 mmol/L (ref 22–32)
Calcium: 8.7 mg/dL — ABNORMAL LOW (ref 8.9–10.3)
Chloride: 103 mmol/L (ref 98–111)
Creatinine, Ser: 3.23 mg/dL — ABNORMAL HIGH (ref 0.61–1.24)
GFR, Estimated: 21 mL/min — ABNORMAL LOW (ref >60)
Glucose, Bld: 133 mg/dL — ABNORMAL HIGH (ref 70–99)
Potassium: 3.7 mmol/L (ref 3.5–5.1)
Sodium: 138 mmol/L (ref 135–145)

## 2022-01-09 LAB — LIPID PANEL
Cholesterol: 141 mg/dL (ref 0–200)
HDL: 25 mg/dL — ABNORMAL LOW (ref >40)
LDL Cholesterol: 88 mg/dL (ref 0–99)
Total CHOL/HDL Ratio: 5.6 RATIO
Triglycerides: 140 mg/dL (ref <150)
VLDL: 28 mg/dL (ref 0–40)

## 2022-01-09 NOTE — Progress Notes (Signed)
Patient refused glucose monitoring. Sharilyn Sites notified.

## 2022-01-09 NOTE — Consult Note (Signed)
Advanced Heart Failure Team Consult Note   Primary Physician: [name], MD PCP-Cardiologist:  Ida Rogue, MD  Reason for Consultation: HF  HPI:    Hector [name] is seen today for evaluation of HF at the request of Ignacia Bayley, NP.   Hector [name] is a 56 yo male with ETOH use, DM2, morbid obesity, PAF (h/o GIB on apixaban),  OSA on CPAP, CKD stage IV (Scr ~3.3), medication noncompliance.   HF dates back to at least 2009, thought to presumably be nonischemic in the setting of alcohol use, with an EF of 20%.    Echo 2016 EF 20-25%.  He was first seen in clinic 03/2014 for hospital follow-up.  LHC 12/2014 minimal CAD  EF initially improved with restoration of NSR but then dropped back down   He underwent TEE/DCCV in 02/2021 at that time showed an EF of less than 20% with severely reduced RV systolic function.  He was reevaluated by EP in 04/2021 and felt not to be a good candidate for ICD or ablation given medication noncompliance.  Admitted in 9/23 & 10/23 with ADHF   Limited echo EF <  20% with moderate RV dysfunction and moderate to sever MR.  He was discharged from the hospital on 12/12/2021 with a weight of 247 pounds.     He presented to the [facility] emergency department 01/06/2022 with shortness of breath, abdominal swelling, palpitations, and peripheral edema.  Has been diuresed with IV lasix. Weight down 6 pounds to 248. Not very interactive as he says he wants to sleep but denies SOB, orthopnea or PND currently.   Scr 3.4 -> 3.2   Review of Systems: [y] = yes, [ ]  = no   General: Weight gain [ y]; Weight loss [ ] ; Anorexia [ ] ; Fatigue Blue.Reese ]; Fever [ ] ; Chills [ ] ; Weakness [ ]   Cardiac: Chest pain/pressure [ ] ; Resting SOB [ y]; Exertional SOB [ y]; Orthopnea [ y]; Pedal Edema Blue.Reese ]; Palpitations [ ] ; Syncope [ ] ; Presyncope [ ] ; Paroxysmal nocturnal dyspnea[y ]  Pulmonary: Cough [ ] ; Wheezing[ ] ; Hemoptysis[ ] ; Sputum [ ] ; Snoring [ ]   GI: Vomiting[  ]; Dysphagia[ ] ; Melena[ ] ; Hematochezia [ ] ; Heartburn[ ] ; Abdominal pain [ ] ; Constipation [ ] ; Diarrhea [ ] ; BRBPR [ ]   GU: Hematuria[ ] ; Dysuria [ ] ; Nocturia[ ]   Vascular: Pain in legs with walking [ ] ; Pain in feet with lying flat [ ] ; Non-healing sores [ ] ; Stroke [ ] ; TIA [ ] ; Slurred speech [ ] ;  Neuro: Headaches[ ] ; Vertigo[ ] ; Seizures[ ] ; Paresthesias[ ] ;Blurred vision [ ] ; Diplopia [ ] ; Vision changes [ ]   Ortho/Skin: Arthritis Blue.Reese ]; Joint pain Blue.Reese ]; Muscle pain [ ] ; Joint swelling [ ] ; Back Pain [ ] ; Rash [ ]   Psych: Depression[ ] ; Anxiety[ ]   Heme: Bleeding problems [ ] ; Clotting disorders [ ] ; Anemia [ ]   Endocrine: Diabetes [ ] ; Thyroid dysfunction[ ]   Home Medications Prior to Admission medications   Medication Sig Start Date End Date Taking? Authorizing Provider  allopurinol (ZYLOPRIM) 100 MG tablet Take 0.5 tablets (50 mg total) by mouth daily. 10/03/21  Yes Danford, Suann Larry, MD  apixaban (ELIQUIS) 5 MG TABS tablet Take 1 tablet (5 mg total) by mouth 2 (two) times daily. 10/02/21  Yes Danford, Suann Larry, MD  atorvastatin (LIPITOR) 80 MG tablet Take 1 tablet (80 mg total) by mouth daily. 11/24/21  Yes Annita Brod, MD  carvedilol (COREG)  6.25 MG tablet Take 1 tablet (6.25 mg total) by mouth 2 (two) times daily with a meal. 10/02/21  Yes Danford, Suann Larry, MD  dapagliflozin propanediol (FARXIGA) 10 MG TABS tablet Take 1 tablet (10 mg total) by mouth daily before breakfast. 10/02/21  Yes Danford, Suann Larry, MD  ferrous gluconate (FERGON) 324 MG tablet Take 324 mg by mouth daily with breakfast. 12/22/21  Yes [provider]  Ferrous Gluconate 324 (37.5 Fe) MG TABS Take 1 tablet by mouth every 12 (twelve) hours as needed. 12/22/21  Yes [provider]  ferrous sulfate 325 (65 FE) MG tablet Take 1 tablet (325 mg total) by mouth daily with breakfast. 12/12/21  Yes Lorella Nimrod, MD  levothyroxine (SYNTHROID) 100 MCG tablet Take 1 tablet (100 mcg  total) by mouth daily before breakfast. 11/24/21  Yes Annita Brod, MD  potassium chloride SA (KLOR-CON M) 20 MEQ tablet Take 1 tablet (20 mEq total) by mouth daily. 11/24/21  Yes Annita Brod, MD  Torsemide 40 MG TABS Take 40 mg by mouth 2 (two) times daily. Patient taking differently: Take 40 mg by mouth daily. 11/24/21 01/06/22 Yes Annita Brod, MD    Past Medical History: Past Medical History:  Diagnosis Date   Alcohol abuse    Alcoholic cardiomyopathy ([place])    a. 12/2007 MV: EF 28%, no isch;  b. 8/12 Echo: EF 25-35%; c. 02/2014 Echo: EF 20-25%; d. 12/2014 Cath: minimal CAD; e. 01/2015 Echo: EF 50-55%;  d. 05/2016 Echo: EF 30-35%, diff HK, gr2 DD; e. 11/2016 Echo: EF 45-50%, diff HK; f. 09/2021 Echo: EF <20%; g. 11/2021 Echo: EF<20%.   Chronic combined systolic (congestive) and diastolic (congestive) heart failure ([place])    a. 05/2016 Echo: EF 30-35%; b. 11/2016 Echo: EF 45-50%, diff HK; c. 09/2021 Echo: EF < 20%; d. 11/2021 Echo: EF <20%, no rwma, Nl RV fxn, sev dil LA, mod-sev MR. mod dil PA w/ mildly elev PASP.   CKD (chronic kidney disease), stage III (HCC)    Elevated troponin (chronic)    Essential hypertension    GI bleed 11/2013   Hyperlipidemia    Pancreatitis    Paroxysmal A-fib (HCC)    a. new onset s/p unsuccessful TEE/DCCV on 08/16/2014; b. CHA2DS2VASc = 3-> eliquis (freq noncompliant); c. 02/2021 s/p TEE/DCCV; d. Prev on amio->d/c 2/2 abnl TFTs.   Paroxysmal atrial flutter (HCC)    Sleep-disordered breathing    Has yet to have a sleep study   Stroke [facility])     Past Surgical History: Past Surgical History:  Procedure Laterality Date   CARDIAC CATHETERIZATION N/A 01/09/2015   Procedure: Left Heart Cath and Coronary Angiography;  Surgeon: [name], MD;  Location: [place] [facility];  Service: Cardiovascular;  Laterality: N/A;   CARDIOVERSION N/A 03/05/2021   Procedure: CARDIOVERSION;  Surgeon: Kate Sable, MD;  Location: [facility] ORS;  Service:  Cardiovascular;  Laterality: N/A;   COLONOSCOPY N/A 07/22/2020   Procedure: COLONOSCOPY;  Surgeon: Toledo, Benay Pike, MD;  Location: [facility] ENDOSCOPY;  Service: Gastroenterology;  Laterality: N/A;   ELECTROPHYSIOLOGIC STUDY N/A 08/16/2014   Procedure: CARDIOVERSION;  Surgeon: [name], MD;  Location: [facility] ORS;  Service: Cardiovascular;  Laterality: N/A;   FLEXIBLE SIGMOIDOSCOPY N/A 10/10/2015   Procedure: FLEXIBLE SIGMOIDOSCOPY;  Surgeon: [name], MD;  Location: [facility]COPY;  Service: Endoscopy;  Laterality: N/A;   KNEE SURGERY Right    NM MYOVIEW LTD  November 2011   No ischemia or infarction. EF  50-55% ( no improvement from 2009 Myoview EF of 28%   TEE WITHOUT CARDIOVERSION N/A 08/16/2014   Procedure: TRANSESOPHAGEAL ECHOCARDIOGRAM (TEE);  Surgeon: [name], MD;  Location: [facility] ORS;  Service: Cardiovascular;  Laterality: N/A;   TEE WITHOUT CARDIOVERSION N/A 03/05/2021   Procedure: TRANSESOPHAGEAL ECHOCARDIOGRAM (TEE);  Surgeon: Kate Sable, MD;  Location: [facility] ORS;  Service: Cardiovascular;  Laterality: N/A;    Family History: Family History  Problem Relation Age of Onset   Hypertension Mother    Hyperlipidemia Mother    Diabetes Mother     Social History: Social History   Socioeconomic History   Marital status: Single    Spouse name: Not on file   Number of children: Not on file   Years of education: Not on file   Highest education level: Not on file  Occupational History   Not on file  Tobacco Use   Smoking status: Former    Packs/day: 1.00    Years: 12.00    Total pack years: 12.00    Types: Cigarettes    Quit date: 02/23/1998    Years since quitting: 23.8   Smokeless tobacco: Never  Vaping Use   Vaping Use: Never used  Substance and Sexual Activity   Alcohol use: Not Currently    Alcohol/week: 0.0 standard drinks of alcohol    Comment: Past heavy drinker   Drug use: No   Sexual activity: Not Currently  Other Topics Concern   Not on  file  Social History Narrative   Not on file   Social Determinants of Health   Financial Resource Strain: Low Risk  (01/04/2019)   Overall Financial Resource Strain (CARDIA)    Difficulty of Paying Living Expenses: Not hard at all  Food Insecurity: No Food Insecurity (01/07/2022)   Hunger Vital Sign    Worried About Running Out of Food in the Last Year: Never true    Ran Out of Food in the Last Year: Never true  Transportation Needs: No Transportation Needs (01/07/2022)   PRAPARE - Hydrologist (Medical): No    Lack of Transportation (Non-Medical): No  Physical Activity: Inactive (01/04/2019)   Exercise Vital Sign    Days of Exercise per Week: 0 days    Minutes of Exercise per Session: 0 min  Stress: No Stress Concern Present (01/04/2019)   [place]    Feeling of Stress : Not at all  Social Connections: Moderately Isolated (01/04/2019)   Social Connection and Isolation Panel [NHANES]    Frequency of Communication with Friends and Family: More than three times a week    Frequency of Social Gatherings with Friends and Family: More than three times a week    Attends Religious Services: 1 to 4 times per year    Active Member of [employer] or Organizations: No    Attends Archivist Meetings: Never    Marital Status: Never married    Allergies:  Allergies  Allergen Reactions   Esomeprazole Magnesium Other (See Comments)    Suspected interstitial nephritis 2018   Eggs Or Egg-Derived Products Rash    Objective:    Vital Signs:   Temp:  [97.7 F (36.5 C)-98.6 F (37 C)] 98.2 F (36.8 C) (11/17 0946) Pulse Rate:  [83-110] 110 (11/17 0946) Resp:  [16-20] 20 (11/17 0407) BP: (96-118)/(76-99) 118/76 (11/17 0946) SpO2:  [96 %-99 %] 98 % (11/17 0946) Weight:  [112.6 kg] 112.6 kg (11/17  0944) Last BM Date : 01/08/22  Weight change: Filed Weights   01/07/22 0255 01/08/22 0500  01/09/22 0944  Weight: 115.5 kg 112.8 kg 112.6 kg    Intake/Output:   Intake/Output Summary (Last 24 hours) at 01/09/2022 1143 Last data filed at 01/09/2022 0951 Gross per 24 hour  Intake 960 ml  Output 3600 ml  Net -2640 ml      Physical Exam    General:  Well appearing. No resp difficulty HEENT: normal Neck: supple. JVP 5-6 . Carotids 2+ bilat; no bruits. No lymphadenopathy or thyromegaly appreciated. Cor: PMI nondisplaced. Irreg tachyNo rubs, gallops or murmurs. Lungs: clear Abdomen: soft, nontender, nondistended. No hepatosplenomegaly. No bruits or masses. Good bowel sounds. Extremities: no cyanosis, clubbing, rash, edema Neuro: alert & orientedx3, cranial nerves grossly intact. moves all 4 extremities w/o difficulty. Affect pleasant    EKG    AF 119 LAFB Personally reviewed   Labs   Basic Metabolic Panel: Recent Labs  Lab 01/06/22 1048 01/07/22 0409 01/08/22 0554 01/09/22 0657  NA 137 136 138 138  K 4.7 3.6 3.6 3.7  CL 104 101 102 103  CO2 21* 26 25 26   GLUCOSE 108* 135* 122* 133*  BUN 41* 50* 50* 52*  CREATININE 3.10* 3.41* 3.46* 3.23*  CALCIUM 8.9 8.8* 9.0 8.7*    Liver Function Tests: Recent Labs  Lab 01/06/22 1048  AST 24  ALT 12  ALKPHOS 70  BILITOT 2.5*  PROT 7.1  ALBUMIN 3.6   No results for input(s): "LIPASE", "AMYLASE" in the last 168 hours. No results for input(s): "AMMONIA" in the last 168 hours.  CBC: Recent Labs  Lab 01/06/22 1048 01/07/22 0409  WBC 8.2 7.8  HGB 14.5 15.0  HCT 46.3 48.2  MCV 82.2 82.0  PLT 235 240    Cardiac Enzymes: No results for input(s): "CKTOTAL", "CKMB", "CKMBINDEX", "TROPONINI" in the last 168 hours.  BNP: BNP (last 3 results) Recent Labs    11/18/21 1235 12/07/21 1411 01/06/22 1048  BNP 2,089.8* 1,874.7* 1,949.1*    ProBNP (last 3 results) No results for input(s): "PROBNP" in the last 8760 hours.   CBG: No results for input(s): "GLUCAP" in the last 168 hours.  Coagulation  Studies: No results for input(s): "LABPROT", "INR" in the last 72 hours.   Imaging   No results found.   Medications:     Current Medications:  allopurinol  50 mg Oral Daily   apixaban  5 mg Oral BID   atorvastatin  80 mg Oral Daily   carvedilol  6.25 mg Oral BID WC   ferrous gluconate  324 mg Oral Q breakfast   furosemide  80 mg Intravenous BID   insulin aspart  0-5 Units Subcutaneous QHS   insulin aspart  0-9 Units Subcutaneous TID WC   levothyroxine  100 mcg Oral QAC breakfast   potassium chloride SA  20 mEq Oral Daily    Infusions:    Assessment/Plan   A/c combined systolic and diastolic congestive heart failure/nonischemic cardiomyopathy - Onset 2009 - LHC 2016 minimal CAD -Limited echocardiogram 10/23. LVEF<20% RV moderately down moderate to severe MR -Has had multiple hospitalizations for heart failure exacerbation likely secondary to noncompliance -Unfortunately he is not a case for ACE inhibitor/ARB/ARNI/MRA secondary to stage IV CKD -Patient was previously advised deemed not a candidate for ablation or ICD insertion by EP due to noncompliance - Given CKD 4, tobacco use and obesity he is not candidate for advanced HF therapies - Etiology of cMRI unclear  but may have a component of tachy-CM with AF. Has been seen by EP and not felt to be candidate for ablation. Will need better assessment of adequacy of rate control as outpatient. - I suspect he has end-stage HF and management complicated by non-compliance - We will follow in HF Clinic to try to manage volume more closely. May be getting close to need for HD soon but not sure he will tolerate with very low EF. For now would push IV diuresis to get him as dry as possible prior to d/c (likely close)  - BP too soft for hydral/nitrates at this time - Would restart Farxig prior to d/c    Permanent atrial fibrillation/atrial flutter -Currently in atrial fibrillation rate controlled of 80-110 -On carvedilol 6.25 mg  twice daily -Continued on apixaban 5 mg twice daily for CHA2DS2-VASc score of at least 6 -Previously seen by EP -> not canddiate for ablation -Will need Zio as outpatient to assess adequacy of rate control. May need to reconsider amio for rate control   CKD stage IV -Baseline Scr 2.8-3.4 -Daily BMP -Monitor urine output -Monitor/trend/replete electrolytes as needed -Avoid nephrotoxic agents where able   4.Nonobstructive coronary artery disease - Cath 2016   5. Obstructive sleep apnea -Patient states he uses CPAP at home   6. Type 2 diabetes -A1c 7.2 on 11/19/2021 -On Farxiga managed by PCP -Management per primary team  7. Tobacco use - said he no longer smokes   8. Medical nonadherence -Patient states he has been compliant with his torsemide but is unsure about his other medications   Length of Stay: 3  Glori Bickers, MD  01/09/2022, 11:43 AM  Advanced Heart Failure Team Pager [phone] (M-F; 7a - 5p)  Please contact [facility] for night-coverage after hours (4p -7a ) and weekends on [email or web address]

## 2022-01-09 NOTE — Progress Notes (Signed)
Pt continues to refuse CBG check and SSI.

## 2022-01-09 NOTE — Progress Notes (Addendum)
.  vest  Reading 54%

## 2022-01-09 NOTE — Progress Notes (Signed)
[facility]  ROUNDING NOTE   Subjective:   Hector [name] is a 56 y.o. male with past medical conditions including hypertension, paroxysmal A-fib on Eliquis, CHF with a EF less than 20%, and chronic kidney disease stage IV.  Patient presents to the emergency department with shortness of breath and increased lower extremity edema. Patient has been admitted for Acute respiratory failure with hypoxia ([place]) [J96.01] Atrial fibrillation with RVR ([place]) [I48.91] Acute on chronic combined systolic (congestive) and diastolic (congestive) heart failure (HCC) [I50.43] Acute on chronic congestive heart failure, unspecified heart failure type [facility]) [I50.9]  Patient is known to our clinic from previous admissions.  He is followed outpatient by [facility] nephrology.    Patient resting comfortably in bed Alert and oriented Remains on room air Trace lower extremity edema Appetite appropriate   Objective:  Vital signs in last 24 hours:  Temp:  [97.7 F (36.5 C)-98.6 F (37 C)] 98.2 F (36.8 C) (11/17 0946) Pulse Rate:  [87-110] 110 (11/17 0946) Resp:  [16-20] 20 (11/17 0407) BP: (96-118)/(76-97) 118/76 (11/17 0946) SpO2:  [96 %-98 %] 98 % (11/17 0946) Weight:  [112.6 kg] 112.6 kg (11/17 0944)  Weight change:  Filed Weights   01/07/22 0255 01/08/22 0500 01/09/22 0944  Weight: 115.5 kg 112.8 kg 112.6 kg    Intake/Output: I/O last 3 completed shifts: In: 960 [P.O.:960] Out: 4325 [Urine:4325]   Intake/Output this shift:  Total I/O In: 240 [P.O.:240] Out: 375 [Urine:375]  Physical Exam: General: NAD  Head: Normocephalic, atraumatic. Moist oral mucosal membranes  Eyes: Anicteric  Lungs:  Clear to auscultation, normal effort, room air  Heart: Regular rate and rhythm  Abdomen:  Soft, nontender, distended  Extremities: trace peripheral edema.  Neurologic: Nonfocal, moving all four extremities  Skin: No lesions  Access: None    Basic Metabolic Panel: Recent Labs  Lab  01/06/22 1048 01/07/22 0409 01/08/22 0554 01/09/22 0657  NA 137 136 138 138  K 4.7 3.6 3.6 3.7  CL 104 101 102 103  CO2 21* 26 25 26   GLUCOSE 108* 135* 122* 133*  BUN 41* 50* 50* 52*  CREATININE 3.10* 3.41* 3.46* 3.23*  CALCIUM 8.9 8.8* 9.0 8.7*     Liver Function Tests: Recent Labs  Lab 01/06/22 1048  AST 24  ALT 12  ALKPHOS 70  BILITOT 2.5*  PROT 7.1  ALBUMIN 3.6    No results for input(s): "LIPASE", "AMYLASE" in the last 168 hours. No results for input(s): "AMMONIA" in the last 168 hours.  CBC: Recent Labs  Lab 01/06/22 1048 01/07/22 0409  WBC 8.2 7.8  HGB 14.5 15.0  HCT 46.3 48.2  MCV 82.2 82.0  PLT 235 240     Cardiac Enzymes: No results for input(s): "CKTOTAL", "CKMB", "CKMBINDEX", "TROPONINI" in the last 168 hours.  BNP: Invalid input(s): "POCBNP"  CBG: No results for input(s): "GLUCAP" in the last 168 hours.  Microbiology: Results for orders placed or performed during the hospital encounter of 10/13/21  Resp Panel by RT-PCR (Flu A&B, Covid) Anterior Nasal Swab     Status: None   Collection Time: 10/13/21  3:50 PM   Specimen: Anterior Nasal Swab  Result Value Ref Range Status   SARS Coronavirus 2 by RT PCR NEGATIVE NEGATIVE Final    Comment: (NOTE) SARS-CoV-2 target nucleic acids are NOT DETECTED.  The SARS-CoV-2 RNA is generally detectable in upper respiratory specimens during the acute phase of infection. The lowest concentration of SARS-CoV-2 viral copies this assay can detect is  138 copies/mL. A negative result does not preclude SARS-Cov-2 infection and should not be used as the sole basis for treatment or other patient management decisions. A negative result may occur with  improper specimen collection/handling, submission of specimen other than nasopharyngeal swab, presence of viral mutation(s) within the areas targeted by this assay, and inadequate number of viral copies(<138 copies/mL). A negative result must be combined  with clinical observations, patient history, and epidemiological information. The expected result is Negative.  Fact Sheet for Patients:  EntrepreneurPulse.com.au  Fact Sheet for Healthcare Providers:  IncredibleEmployment.be  This test is no t yet approved or cleared by the [place] FDA and  has been authorized for detection and/or diagnosis of SARS-CoV-2 by FDA under an Emergency Use Authorization (EUA). This EUA will remain  in effect (meaning this test can be used) for the duration of the COVID-19 declaration under Section 564(b)(1) of the Act, 21 U.S.C.section 360bbb-3(b)(1), unless the authorization is terminated  or revoked sooner.       Influenza A by PCR NEGATIVE NEGATIVE Final   Influenza B by PCR NEGATIVE NEGATIVE Final    Comment: (NOTE) The Xpert Xpress SARS-CoV-2/FLU/RSV plus assay is intended as an aid in the diagnosis of influenza from Nasopharyngeal swab specimens and should not be used as a sole basis for treatment. Nasal washings and aspirates are unacceptable for Xpert Xpress SARS-CoV-2/FLU/RSV testing.  Fact Sheet for Patients: EntrepreneurPulse.com.au  Fact Sheet for Healthcare Providers: IncredibleEmployment.be  This test is not yet approved or cleared by the [place] FDA and has been authorized for detection and/or diagnosis of SARS-CoV-2 by FDA under an Emergency Use Authorization (EUA). This EUA will remain in effect (meaning this test can be used) for the duration of the COVID-19 declaration under Section 564(b)(1) of the Act, 21 U.S.C. section 360bbb-3(b)(1), unless the authorization is terminated or revoked.  Performed at [facility], [address] [place], [place] [address]     Coagulation Studies: No results for input(s): "LABPROT", "INR" in the last 72 hours.  Urinalysis: No results for input(s): "COLORURINE", "LABSPEC", "PHURINE",  "GLUCOSEU", "HGBUR", "BILIRUBINUR", "KETONESUR", "PROTEINUR", "UROBILINOGEN", "NITRITE", "LEUKOCYTESUR" in the last 72 hours.  Invalid input(s): "APPERANCEUR"    Imaging: No results found.   Medications:     allopurinol  50 mg Oral Daily   apixaban  5 mg Oral BID   atorvastatin  80 mg Oral Daily   carvedilol  6.25 mg Oral BID WC   ferrous gluconate  324 mg Oral Q breakfast   furosemide  80 mg Intravenous BID   insulin aspart  0-5 Units Subcutaneous QHS   insulin aspart  0-9 Units Subcutaneous TID WC   levothyroxine  100 mcg Oral QAC breakfast   potassium chloride SA  20 mEq Oral Daily   acetaminophen **OR** acetaminophen, ondansetron **OR** ondansetron (ZOFRAN) IV  Assessment/ Plan:  Hector [name] is a 56 y.o.  male past medical conditions including hypertension, paroxysmal A-fib on Eliquis, CHF with a EF less than 20%, and chronic kidney disease stage IV.  Patient presents to the emergency department with shortness of breath and increased lower extremity edema. Patient has been admitted for Acute respiratory failure with hypoxia ([place]) [J96.01] Atrial fibrillation with RVR ([place]) [I48.91] Acute on chronic combined systolic (congestive) and diastolic (congestive) heart failure (HCC) [I50.43] Acute on chronic congestive heart failure, unspecified heart failure type ([place]) [I50.9]   Acute Kidney Injury on chronic kidney disease stage IV with baseline creatinine 2.81 and GFR of 25  on 12/12/2021.  Acute kidney injury secondary to cardiorenal syndrome.  Cardiology requested continuation of IV furosemide 80 mg twice daily.  We will transition to oral diuretics at discharge.  Renal function slightly improved.  Urine output adequate at 3.6 L.  We will continue to monitor.  Patient will need follow-up appointment with [facility] nephrology at discharge.   Lab Results  Component Value Date   CREATININE 3.23 (H) 01/09/2022   CREATININE 3.46 (H) 01/08/2022   CREATININE 3.41 (H) 01/07/2022     Intake/Output Summary (Last 24 hours) at 01/09/2022 1204 Last data filed at 01/09/2022 0951 Gross per 24 hour  Intake 960 ml  Output 3000 ml  Net -2040 ml    2. Anemia of chronic kidney disease Lab Results  Component Value Date   HGB 15.0 01/07/2022    Hemoglobin above target.  3. Chronic systolic and diastolic heart failure.  Echo from 12/08/2021 shows EF less than 20% with a moderate to severe mitral valve regurgitation.  Continued diuresis with IV Furosemide per Cardiology.   4. 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 Home regimen includes Wilder Glade, held. Most recent hemoglobin A1c is 7.2 on 11/19/21. Diet controlled   LOS: 3 [place] 11/17/202312:04 PM

## 2022-01-09 NOTE — Assessment & Plan Note (Signed)
Renal functions currently stable. Nephrology is following -Monitor renal function while he is being diuresed. -Likely approaching dialysis, not sure whether he can tolerate with very low AF

## 2022-01-09 NOTE — Progress Notes (Signed)
Progress Note   Patient: Hector [name] JQZ:[id number] DOB: [birth year] DOA: 01/06/2022     3 DOS: the patient was seen and examined on 01/09/2022   Brief hospital course: Confirm H&P.  [name] is a 56 y.o. male with medical history significant for nonischemic cardiomyopathy with last known LVEF less than 20%, stage IV chronic kidney disease, hypertension, paroxysmal atrial fibrillation on chronic anticoagulation therapy, obesity hypothyroidism who presents to the ER for evaluation of increased abdominal girth, lower extremity swelling and exertional Shortness of Breath. Patient states that he has been compliant with his medications but admits to dietary indiscretion.  He feels that he has gained weight but is unable to tell me how much He has had several hospitalizations in the last 2 months for acute CHF and was recently discharged on 12/12/21.  ED course.  Found to be in A-fib with RVR hypoxic requiring up to 4 L of oxygen.  Labs pertinent for BNP 1949, troponin 61 Chest x-ray with cardiomegaly and pulmonary vascular congestion, more focal opacities in the left upper lobe may reflect alveolar edema or superimposed pneumonia. Patient received 80 mg of IV Lasix and admitted for further management.  11/15: Hemodynamically stable, ongoing mania, still feeling short of breath, no significant peripheral edema, mildly distended abdomen.  Creatinine bumped to 3.41 after getting IV Lasix yesterday.  Per cardiology might be due to cardiorenal, not much significant urinary output recorded, they increased the dose of IV Lasix to 80 mg twice daily. Nephrology is also on board.  11/16: Hemodynamically stable, renal function seems stable with urinary output of 2400 after increasing the dose of Lasix.  Weight decreased to 248lbs. cardiology would like to continue IV diuresis for another day.  Patient will be transition to torsemide 40 mg twice daily as recommended by nephrology on discharge.   Might get benefit from follow-up at advance heart failure clinic with Dr. Haroldine Laws.    11/17: Dr. Haroldine Laws saw him today recommending continuation of IV diuretics to make him dry as much as possible, most likely had end-stage heart failure, he was also recommending ZIO monitor to watch for heart rate and A-fib.  Can consider starting amiodarone.  Should go back on Farxiga on discharge. Renal functions with slight improvement today, urinary output of 3600 recorded with net negative of -5 L.  Weight seems stable at 248.  Patient is high risk for mortality based on life limiting underlying comorbidities with advanced heart and renal failure.  Assessment and Plan: * Acute on chronic combined systolic (congestive) and diastolic (congestive) heart failure ([place]) Patient presents for evaluation of increased abdominal girth, exertional shortness of breath and lower extremity swelling. Also noted to be in rapid A-fib with a rapid ventricular rate His last known LVEF is less than 20% from a 2D echocardiogram which was done in 11/2021.  Patient received IV Lasix in ED followed by continuation of 40 mg twice daily. Renal function with slight improvement.  UOP of 3600 -Advanced heart failure team evaluated him -Cardiology would like to continue with IV Lasix 80 mg twice daily for another day -Continue with carvedilol -Daily BMP and weight -Strict intake and output  Atrial fibrillation with RVR (HCC) History of paroxysmal A-fib, going in and out of RVR Continue carvedilol for rate control -Cardiology might consider starting amiodarone Continue Eliquis as primary prophylaxis for an acute stroke  Chronic renal impairment, stage 4 (severe) ([place]) Renal functions currently stable. Nephrology is following -Monitor renal function while he is being diuresed. 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   Weight as of this encounter: 115.5 kg.   Complicates overall prognosis and care Lifestyle modification and exercise has been discussed with patient in detail   Subjective: Patient was eating breakfast when seen today.  Denies any shortness of breath.  Physical Exam: Vitals:   01/09/22 0944 01/09/22 0946 01/09/22 1300 01/09/22 1422  BP:  118/76    Pulse:  (!) 110    Resp:   (!) 24 (!) 24  Temp:  98.2 F (36.8 C)    TempSrc:  Oral    SpO2:  98%    Weight: 112.6 kg     Height:       General.  Obese gentleman, in no acute distress. Pulmonary.  Lungs clear bilaterally, normal respiratory effort. CV.  Regular rate and rhythm, no JVD, rub or murmur. Abdomen.  Soft, nontender, nondistended, BS positive. CNS.  Alert and oriented .  No focal neurologic deficit. Extremities.  No edema, no cyanosis, pulses intact and symmetrical. Psychiatry.  Judgment and insight appears normal.    Data Reviewed: Prior data reviewed  Family Communication: Discussed with patient  Disposition: Status is: Inpatient Remains inpatient appropriate because: Severity of illness  Planned Discharge Destination: Home  ED prophylaxis.  Eliquis Time spent: 44 minutes  This record has been created using Systems analyst. Errors have been sought and corrected,but may not always be located. Such creation errors do not reflect on the standard of care.   Author: Lorella Nimrod, MD 01/09/2022 2:31 PM  For on call review [email or web address].

## 2022-01-09 NOTE — Assessment & Plan Note (Signed)
History of paroxysmal A-fib, going in and out of RVR Continue carvedilol for rate control -Cardiology might consider starting amiodarone Continue Eliquis as primary prophylaxis for an acute stroke

## 2022-01-10 DIAGNOSIS — I5043 Acute on chronic combined systolic (congestive) and diastolic (congestive) heart failure: Secondary | ICD-10-CM | POA: Diagnosis not present

## 2022-01-10 LAB — BASIC METABOLIC PANEL WITH GFR
Anion gap: 13 (ref 5–15)
BUN: 51 mg/dL — ABNORMAL HIGH (ref 6–20)
CO2: 25 mmol/L (ref 22–32)
Calcium: 8.9 mg/dL (ref 8.9–10.3)
Chloride: 98 mmol/L (ref 98–111)
Creatinine, Ser: 3.12 mg/dL — ABNORMAL HIGH (ref 0.61–1.24)
GFR, Estimated: 22 mL/min — ABNORMAL LOW
Glucose, Bld: 113 mg/dL — ABNORMAL HIGH (ref 70–99)
Potassium: 3.7 mmol/L (ref 3.5–5.1)
Sodium: 136 mmol/L (ref 135–145)

## 2022-01-10 NOTE — Progress Notes (Signed)
[facility]  ROUNDING NOTE   Subjective:   Hector [name] is a 56 y.o. male with past medical conditions including hypertension, paroxysmal A-fib on Eliquis, CHF with a EF less than 20%, and chronic kidney disease stage IV.  Patient presents to the emergency department with shortness of breath and increased lower extremity edema. Patient has been admitted for Acute respiratory failure with hypoxia ([place]) [J96.01] Atrial fibrillation with RVR ([place]) [I48.91] Acute on chronic combined systolic (congestive) and diastolic (congestive) heart failure (HCC) [I50.43] Acute on chronic congestive heart failure, unspecified heart failure type [facility]) [I50.9]  Patient is known to our clinic from previous admissions.  He is followed outpatient by [facility] nephrology.    Patient seen resting quietly, alert and oriented States he feels slightly better today than yesterday No lower extremity edema Remains on room air  Adequate urine output recorded at 2 L in preceding 24 hours   Objective:  Vital signs in last 24 hours:  Temp:  [97.7 F (36.5 C)-98.2 F (36.8 C)] 97.8 F (36.6 C) (11/18 0809) Pulse Rate:  [90-101] 94 (11/18 0809) Resp:  [16-24] 20 (11/18 0809) BP: (102-125)/(84-112) 125/112 (11/18 0809) SpO2:  [94 %-99 %] 95 % (11/18 0809)  Weight change:  Filed Weights   01/07/22 0255 01/08/22 0500 01/09/22 0944  Weight: 115.5 kg 112.8 kg 112.6 kg    Intake/Output: I/O last 3 completed shifts: In: 240 [P.O.:240] Out: 8850 [Urine:3950]   Intake/Output this shift:  No intake/output data recorded.  Physical Exam: General: NAD  Head: Normocephalic, atraumatic. Moist oral mucosal membranes  Eyes: Anicteric  Lungs:  Clear to auscultation, normal effort, room air  Heart: Regular rate and rhythm  Abdomen:  Soft, nontender, distended  Extremities:  no peripheral edema.  Neurologic: Nonfocal, moving all four extremities  Skin: No lesions  Access: None    Basic Metabolic  Panel: Recent Labs  Lab 01/06/22 1048 01/07/22 0409 01/08/22 0554 01/09/22 0657 01/10/22 0606  NA 137 136 138 138 136  K 4.7 3.6 3.6 3.7 3.7  CL 104 101 102 103 98  CO2 21* 26 25 26 25   GLUCOSE 108* 135* 122* 133* 113*  BUN 41* 50* 50* 52* 51*  CREATININE 3.10* 3.41* 3.46* 3.23* 3.12*  CALCIUM 8.9 8.8* 9.0 8.7* 8.9     Liver Function Tests: Recent Labs  Lab 01/06/22 1048  AST 24  ALT 12  ALKPHOS 70  BILITOT 2.5*  PROT 7.1  ALBUMIN 3.6    No results for input(s): "LIPASE", "AMYLASE" in the last 168 hours. No results for input(s): "AMMONIA" in the last 168 hours.  CBC: Recent Labs  Lab 01/06/22 1048 01/07/22 0409  WBC 8.2 7.8  HGB 14.5 15.0  HCT 46.3 48.2  MCV 82.2 82.0  PLT 235 240     Cardiac Enzymes: No results for input(s): "CKTOTAL", "CKMB", "CKMBINDEX", "TROPONINI" in the last 168 hours.  BNP: Invalid input(s): "POCBNP"  CBG: No results for input(s): "GLUCAP" in the last 168 hours.  Microbiology: Results for orders placed or performed during the hospital encounter of 10/13/21  Resp Panel by RT-PCR (Flu A&B, Covid) Anterior Nasal Swab     Status: None   Collection Time: 10/13/21  3:50 PM   Specimen: Anterior Nasal Swab  Result Value Ref Range Status   SARS Coronavirus 2 by RT PCR NEGATIVE NEGATIVE Final    Comment: (NOTE) SARS-CoV-2 target nucleic acids are NOT DETECTED.  The SARS-CoV-2 RNA is generally detectable in upper respiratory specimens during the  acute phase of infection. The lowest concentration of SARS-CoV-2 viral copies this assay can detect is 138 copies/mL. A negative result does not preclude SARS-Cov-2 infection and should not be used as the sole basis for treatment or other patient management decisions. A negative result may occur with  improper specimen collection/handling, submission of specimen other than nasopharyngeal swab, presence of viral mutation(s) within the areas targeted by this assay, and inadequate number of  viral copies(<138 copies/mL). A negative result must be combined with clinical observations, patient history, and epidemiological information. The expected result is Negative.  Fact Sheet for Patients:  EntrepreneurPulse.com.au  Fact Sheet for Healthcare Providers:  IncredibleEmployment.be  This test is no t yet approved or cleared by the [place] FDA and  has been authorized for detection and/or diagnosis of SARS-CoV-2 by FDA under an Emergency Use Authorization (EUA). This EUA will remain  in effect (meaning this test can be used) for the duration of the COVID-19 declaration under Section 564(b)(1) of the Act, 21 U.S.C.section 360bbb-3(b)(1), unless the authorization is terminated  or revoked sooner.       Influenza A by PCR NEGATIVE NEGATIVE Final   Influenza B by PCR NEGATIVE NEGATIVE Final    Comment: (NOTE) The Xpert Xpress SARS-CoV-2/FLU/RSV plus assay is intended as an aid in the diagnosis of influenza from Nasopharyngeal swab specimens and should not be used as a sole basis for treatment. Nasal washings and aspirates are unacceptable for Xpert Xpress SARS-CoV-2/FLU/RSV testing.  Fact Sheet for Patients: EntrepreneurPulse.com.au  Fact Sheet for Healthcare Providers: IncredibleEmployment.be  This test is not yet approved or cleared by the [place] FDA and has been authorized for detection and/or diagnosis of SARS-CoV-2 by FDA under an Emergency Use Authorization (EUA). This EUA will remain in effect (meaning this test can be used) for the duration of the COVID-19 declaration under Section 564(b)(1) of the Act, 21 U.S.C. section 360bbb-3(b)(1), unless the authorization is terminated or revoked.  Performed at [facility], [address] [place], [place] [address]     Coagulation Studies: No results for input(s): "LABPROT", "INR" in the last 72 hours.  Urinalysis: No  results for input(s): "COLORURINE", "LABSPEC", "PHURINE", "GLUCOSEU", "HGBUR", "BILIRUBINUR", "KETONESUR", "PROTEINUR", "UROBILINOGEN", "NITRITE", "LEUKOCYTESUR" in the last 72 hours.  Invalid input(s): "APPERANCEUR"    Imaging: No results found.   Medications:     allopurinol  50 mg Oral Daily   apixaban  5 mg Oral BID   atorvastatin  80 mg Oral Daily   carvedilol  6.25 mg Oral BID WC   ferrous gluconate  324 mg Oral Q breakfast   furosemide  80 mg Intravenous BID   insulin aspart  0-5 Units Subcutaneous QHS   insulin aspart  0-9 Units Subcutaneous TID WC   levothyroxine  100 mcg Oral QAC breakfast   potassium chloride SA  20 mEq Oral Daily   acetaminophen **OR** acetaminophen, ondansetron **OR** ondansetron (ZOFRAN) IV  Assessment/ Plan:  Mr. FREELAND PRACHT is a 56 y.o.  male past medical conditions including hypertension, paroxysmal A-fib on Eliquis, CHF with a EF less than 20%, and chronic kidney disease stage IV.  Patient presents to the emergency department with shortness of breath and increased lower extremity edema. Patient has been admitted for Acute respiratory failure with hypoxia ([place]) [J96.01] Atrial fibrillation with RVR ([place]) [I48.91] Acute on chronic combined systolic (congestive) and diastolic (congestive) heart failure (HCC) [I50.43] Acute on chronic congestive heart failure, unspecified heart failure type ([place]) [I50.9]   Acute  Kidney Injury on chronic kidney disease stage IV with baseline creatinine 2.81 and GFR of 25 on 12/12/2021.  Acute kidney injury secondary to cardiorenal syndrome.  Remains on IV furosemide 80 mg twice daily.  Will transition to oral torsemide 40 mg twice daily closer to discharge.  No lower extremity edema and patient remains on room air.  Renal function slowly improving, patient almost at baseline.   Lab Results  Component Value Date   CREATININE 3.12 (H) 01/10/2022   CREATININE 3.23 (H) 01/09/2022   CREATININE 3.46 (H)  01/08/2022    Intake/Output Summary (Last 24 hours) at 01/10/2022 0956 Last data filed at 01/09/2022 2335 Gross per 24 hour  Intake --  Output 1650 ml  Net -1650 ml    2. Anemia of chronic kidney disease Lab Results  Component Value Date   HGB 15.0 01/07/2022    Hemoglobin within acceptable range.  3. Chronic systolic and diastolic heart failure.  Echo from 12/08/2021 shows EF less than 20% with a moderate to severe mitral valve regurgitation.  Continue IV furosemide with plans to transition to oral torsemide at discharge.  4. 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 Home regimen includes Wilder Glade, held. Most recent hemoglobin A1c is 7.2 on 11/19/21. Diet controlled   LOS: [place] 11/18/20239:56 AM

## 2022-01-10 NOTE — Progress Notes (Signed)
Cardiology Progress Note   Patient Name: Hector [name] Date of Encounter: 01/10/2022  Primary Cardiologist: Ida Rogue, MD  Subjective   Tired this AM.  Lying mostly flat in bed.  Doesn't feel like breathing is quite back to baseline.  Cont to respond well to IV lasix.  Inpatient Medications    Scheduled Meds:  allopurinol  50 mg Oral Daily   apixaban  5 mg Oral BID   atorvastatin  80 mg Oral Daily   carvedilol  6.25 mg Oral BID WC   ferrous gluconate  324 mg Oral Q breakfast   furosemide  80 mg Intravenous BID   insulin aspart  0-5 Units Subcutaneous QHS   insulin aspart  0-9 Units Subcutaneous TID WC   levothyroxine  100 mcg Oral QAC breakfast   potassium chloride SA  20 mEq Oral Daily   Continuous Infusions:  PRN Meds: acetaminophen **OR** acetaminophen, ondansetron **OR** ondansetron (ZOFRAN) IV   Vital Signs    Vitals:   01/09/22 2024 01/09/22 2336 01/10/22 0346 01/10/22 0809  BP: (!) 120/94 (!) 116/105 102/84 (!) 125/112  Pulse: 90 96 (!) 101 94  Resp: 20 16 18 20   Temp: 98.2 F (36.8 C) 98.2 F (36.8 C) 97.8 F (36.6 C) 97.8 F (36.6 C)  TempSrc: Oral Oral Oral Oral  SpO2: 98% 94% 94% 95%  Weight:      Height:        Intake/Output Summary (Last 24 hours) at 01/10/2022 1045 Last data filed at 01/10/2022 1000 Gross per 24 hour  Intake 240 ml  Output 1650 ml  Net -1410 ml   Filed Weights   01/07/22 0255 01/08/22 0500 01/09/22 0944  Weight: 115.5 kg 112.8 kg 112.6 kg    Physical Exam   GEN: Well nourished, well developed, in no acute distress.  HEENT: Grossly normal.  Neck: Supple, no significant JVD, carotid bruits, or masses. Cardiac: IR, IR, tachy, no murmurs, rubs, or gallops. No clubbing, cyanosis, edema.  Radials 2+, DP/PT 2+ and equal bilaterally.  Respiratory:  Respirations regular and unlabored, clear to auscultation bilaterally. GI: Soft, nontender, nondistended, BS + x 4. MS: no deformity or atrophy. Skin: warm and dry,  no rash. Neuro:  Strength and sensation are intact. Psych: AAOx3.  Normal affect.  Labs    Chemistry Recent Labs  Lab 01/06/22 1048 01/07/22 0409 01/08/22 0554 01/09/22 0657 01/10/22 0606  NA 137   < > 138 138 136  K 4.7   < > 3.6 3.7 3.7  CL 104   < > 102 103 98  CO2 21*   < > 25 26 25   GLUCOSE 108*   < > 122* 133* 113*  BUN 41*   < > 50* 52* 51*  CREATININE 3.10*   < > 3.46* 3.23* 3.12*  CALCIUM 8.9   < > 9.0 8.7* 8.9  PROT 7.1  --   --   --   --   ALBUMIN 3.6  --   --   --   --   AST 24  --   --   --   --   ALT 12  --   --   --   --   ALKPHOS 70  --   --   --   --   BILITOT 2.5*  --   --   --   --   GFRNONAA 23*   < > 20* 21* 22*  ANIONGAP 12   < > 11  9 13   < > = values in this interval not displayed.     Hematology Recent Labs  Lab 01/06/22 1048 01/07/22 0409  WBC 8.2 7.8  RBC 5.63 5.88*  HGB 14.5 15.0  HCT 46.3 48.2  MCV 82.2 82.0  MCH 25.8* 25.5*  MCHC 31.3 31.1  RDW 22.9* 22.9*  PLT 235 240    Cardiac Enzymes  Recent Labs  Lab 01/06/22 1048 01/06/22 1610 01/07/22 0409 01/07/22 0513  TROPONINIHS 61* 71* 64* 56*      BNP    Component Value Date/Time   BNP 1,949.1 (H) 01/06/2022 1048    Lipids  Lab Results  Component Value Date   CHOL 141 01/09/2022   HDL 25 (L) 01/09/2022   LDLCALC 88 01/09/2022   TRIG 140 01/09/2022   CHOLHDL 5.6 01/09/2022    HbA1c  Lab Results  Component Value Date   HGBA1C 6.8 (H) 01/07/2022    Radiology    CT CHEST WO CONTRAST  Result Date: 01/06/2022 CLINICAL DATA:  Atrial fibrillation, lower extremity edema, short of breath EXAM: CT CHEST WITHOUT CONTRAST TECHNIQUE: Multidetector CT imaging of the chest was performed following the standard protocol without IV contrast. RADIATION DOSE REDUCTION: This exam was performed according to the departmental dose-optimization program which includes automated exposure control, adjustment of the mA and/or kV according to patient size and/or use of iterative  reconstruction technique. COMPARISON:  01/06/2022, 12/07/2021 FINDINGS: Cardiovascular: Unenhanced imaging demonstrates an enlarged heart without pericardial effusion. Normal caliber of the thoracic aorta. Atherosclerosis of the aorta and coronary vasculature. Evaluation of the vascular lumen is limited without IV contrast. Mediastinum/Nodes: No enlarged mediastinal or axillary lymph nodes. Thyroid gland, trachea, and esophagus demonstrate no significant findings. Lungs/Pleura: Stable bilateral calcified granulomata. Mild subpleural interlobular septal thickening consistent with Kerley lines and mild volume overload. No airspace disease, effusion, or pneumothorax. Central airways are patent. Scattered areas of subpleural scarring are again seen within the right upper and bilateral lower lobes. Upper Abdomen: No acute abnormality. Musculoskeletal: No acute or destructive bony lesions. Hypertrophic changes at the left first costochondral junction likely account for the density reported on recent x-ray. Reconstructed images demonstrate no additional findings. IMPRESSION: 1. Cardiomegaly. 2. Mild subpleural interlobular septal thickening greatest at the bases, consistent with mild fluid overload. No overt edema. 3. Aortic Atherosclerosis (ICD10-I70.0). Coronary artery atherosclerosis. Electronically Signed   By: Randa Ngo M.D.   On: 01/06/2022 15:21    Telemetry    Afib, 90 - 100 - Personally Reviewed  Cardiac Studies   Limited TTE 12/08/2021 1. Left ventricular ejection fraction, by estimation, is <20%. The left  ventricle has severely decreased function. The left ventricle has no  regional wall motion abnormalities. The left ventricular internal cavity  size was mildly to moderately dilated.   2. Right ventricular systolic function is normal. The right ventricular  size is normal. There is mildly elevated pulmonary artery systolic  pressure.   3. Left atrial size was severely dilated.   4. Right  atrial size was mildly dilated.   5. The mitral valve is normal in structure. Moderate to severe mitral  valve regurgitation. No evidence of mitral stenosis.   6. The aortic valve is normal in structure. Aortic valve regurgitation is  not visualized. No aortic stenosis is present.   7. Moderately dilated pulmonary artery.   8. The inferior vena cava is dilated in size with <50% respiratory  variability, suggesting right atrial pressure of 15 mmHg.   Patient Profile  56 y.o. male with a history of chronic combined CHF/NICM likely secondary to alcohol use, hypertension, hyperlipidemia, paroxysmal atrial fibrillation/atrial flutter, type 2 diabetes, hypothyroidism, OSA on CPAP, CKD stage III-4, medication noncompliance, history of GI bleed on apixaban, possible sleep apnea, who was admitted 11/14 w/ recurrent CHF.   Assessment & Plan    1.  Acute on chronic combined syst/diast CHF/NICM:   EF < 20% by echo 11/2021 w/ mod-sev MR. 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  Presented 11/14 w/ dyspnea, hypoxia, abd swelling, and lower ext edema.  BNP 1949.1 on arrival.  Initially had rise in creat w/ diuresis, but w/ escalation of dosing, BUN/Creat has cont to improve (51/3.12 this AM).  Wt cont to come down slowly.  Does not appear markedly volume overloaded on exam, but likely low output.  Breathing not quite back to baseline.  Con IV diuresis today.  Cont ? blocker.  Not a candidate for ace/arb/arni/mra in setting of CKD IV.  If creat cont to improve, consider resumption of SGLT2i.  BP typically too soft for hydralazine/nitrates.  Not felt to be a candidate for advanced therapies though can follow-up advanced heart failure clinic here.  2.  Stage IV currently disease: Creatinine improving with hypertensive diuresis.  Continue to follow.  3.  Permanent atrial fibrillation/flutter: Rates typically in the high 90s to low 100s on low-dose beta-blocker limited by relative  hypotension.  Anticoagulated with Eliquis.  Previously seen by EP, not a candidate for ablation.  Consider outpatient monitoring to assess adequacy of rate control in the outpatient setting, though at outpatient office visits, rates typically in the 80s to 90s.  May need to consider amiodarone for rate control.  4.  Essential hypertension: Stable/soft.  Continue beta-blocker.  5.  Hyperlipidemia: LDL of 88.  Continue statin therapy.  6.  Type 2 diabetes mellitus: A1c 6.8.  Zio therapy per medicine team  7.  Obstructive sleep apnea: CPAP at home.  8.  Demand Ischemia: Mild high-sensitivity troponin elevation in setting of CHF.  Minimal CAD on cath in 2016.  No plans for further ischemic evaluation at this time.  Signed, Murray Hodgkins, NP  01/10/2022, 10:45 AM    For questions or updates, please contact   Please consult [email or web address] for contact info under Cardiology/STEMI.

## 2022-01-10 NOTE — Progress Notes (Signed)
Progress Note   Patient: Hector [name]:[id number] DOB: 1965-04-09 DOA: 01/06/2022     4 DOS: the patient was seen and examined on 01/10/2022   Brief hospital course: Confirm H&P.  LOCHLANN MASTRANGELO is a 56 y.o. male with medical history significant for nonischemic cardiomyopathy with last known LVEF less than 20%, stage IV chronic kidney disease, hypertension, paroxysmal atrial fibrillation on chronic anticoagulation therapy, obesity hypothyroidism who presents to the ER for evaluation of increased abdominal girth, lower extremity swelling and exertional Shortness of Breath. Patient states that he has been compliant with his medications but admits to dietary indiscretion.  He feels that he has gained weight but is unable to tell me how much He has had several hospitalizations in the last 2 months for acute CHF and was recently discharged on 12/12/21.  ED course.  Found to be in A-fib with RVR hypoxic requiring up to 4 L of oxygen.  Labs pertinent for BNP 1949, troponin 61 Chest x-ray with cardiomegaly and pulmonary vascular congestion, more focal opacities in the left upper lobe may reflect alveolar edema or superimposed pneumonia. Patient received 80 mg of IV Lasix and admitted for further management.  11/15: Hemodynamically stable, ongoing mania, still feeling short of breath, no significant peripheral edema, mildly distended abdomen.  Creatinine bumped to 3.41 after getting IV Lasix yesterday.  Per cardiology might be due to cardiorenal, not much significant urinary output recorded, they increased the dose of IV Lasix to 80 mg twice daily. Nephrology is also on board.  11/16: Hemodynamically stable, renal function seems stable with urinary output of 2400 after increasing the dose of Lasix.  Weight decreased to 248lbs. cardiology would like to continue IV diuresis for another day.  Patient will be transition to torsemide 40 mg twice daily as recommended by nephrology on discharge.   Might get benefit from follow-up at advance heart failure clinic with Dr. Haroldine Laws.    11/17: Dr. Haroldine Laws saw him today recommending continuation of IV diuretics to make him dry as much as possible, most likely had end-stage heart failure, he was also recommending ZIO monitor to watch for heart rate and A-fib.  Can consider starting amiodarone.  Should go back on Farxiga on discharge. Renal functions with slight improvement today, urinary output of 3600 recorded with net negative of -5 L.  Weight seems stable at 248.  11/18: Hemodynamically stable, urinary output recorded at 2025, with net negative of -6815, renal functions with slight improvement with creatinine of 3.12 and GFR 22.  Patient is high risk for mortality based on life limiting underlying comorbidities with advanced heart and renal failure.  Assessment and Plan: * Acute on chronic combined systolic (congestive) and diastolic (congestive) heart failure ([place]) Patient presents for evaluation of increased abdominal girth, exertional shortness of breath and lower extremity swelling. Also noted to be in rapid A-fib with a rapid ventricular rate His last known LVEF is less than 20% from a 2D echocardiogram which was done in 11/2021.  Patient received IV Lasix in ED followed by continuation of 40 mg twice daily. Renal function with slight improvement.  UOP of 2025 -Advanced heart failure team evaluated him -Cardiology would like to continue with IV Lasix 80 mg twice daily until his kidneys allows. -Continue with carvedilol -Daily BMP and weight -Strict intake and output  Atrial fibrillation with RVR (HCC) History of paroxysmal A-fib, going in and out of RVR Continue carvedilol for rate control -Cardiology might consider starting amiodarone Continue Eliquis as primary prophylaxis  for an acute stroke  Chronic renal impairment, stage 4 (severe) ([place]) Renal functions currently stable. Nephrology is following -Monitor renal function  while he is being diuresed. 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   Weight as of this encounter: 115.5 kg.   Complicates overall prognosis and care Lifestyle modification and exercise has been discussed with patient in detail   Subjective: Patient was seen and examined today.  Resting comfortably.  Denies any shortness of breath or orthopnea.  Physical Exam: Vitals:   01/09/22 2336 01/10/22 0346 01/10/22 0809 01/10/22 1135  BP: (!) 116/105 102/84 (!) 125/112 93/69  Pulse: 96 (!) 101 94 96  Resp: 16 18 20 20   Temp: 98.2 F (36.8 C) 97.8 F (36.6 C) 97.8 F (36.6 C) 97.9 F (36.6 C)  TempSrc: Oral Oral Oral Oral  SpO2: 94% 94% 95% 91%  Weight:      Height:       General.  Obese gentleman, in no acute distress. Pulmonary.  Lungs clear bilaterally, normal respiratory effort. CV.  Regular rate and rhythm, no JVD, rub or murmur. Abdomen.  Soft, nontender, nondistended, BS positive. CNS.  Alert and oriented .  No focal neurologic deficit. Extremities.  No edema, no cyanosis, pulses intact and symmetrical. Psychiatry.  Judgment and insight appears normal.     Data Reviewed: Prior data reviewed  Family Communication: Discussed with patient  Disposition: Status is: Inpatient Remains inpatient appropriate because: Severity of illness  Planned Discharge Destination: Home  ED prophylaxis.  Eliquis Time spent: 42 minutes  This record has been created using Systems analyst. Errors have been sought and corrected,but may not always be located. Such creation errors do not reflect on the standard of care.    Author: Lorella Nimrod, MD 01/10/2022 3:14 PM  For on call review [email or web address].

## 2022-01-11 DIAGNOSIS — I5043 Acute on chronic combined systolic (congestive) and diastolic (congestive) heart failure: Secondary | ICD-10-CM | POA: Diagnosis not present

## 2022-01-11 LAB — BASIC METABOLIC PANEL
Anion gap: 11 (ref 5–15)
BUN: 54 mg/dL — ABNORMAL HIGH (ref 6–20)
CO2: 21 mmol/L — ABNORMAL LOW (ref 22–32)
Calcium: 8.6 mg/dL — ABNORMAL LOW (ref 8.9–10.3)
Chloride: 101 mmol/L (ref 98–111)
Creatinine, Ser: 3.05 mg/dL — ABNORMAL HIGH (ref 0.61–1.24)
GFR, Estimated: 23 mL/min — ABNORMAL LOW (ref >60)
Glucose, Bld: 112 mg/dL — ABNORMAL HIGH (ref 70–99)
Potassium: 4.6 mmol/L (ref 3.5–5.1)
Sodium: 133 mmol/L — ABNORMAL LOW (ref 135–145)

## 2022-01-11 LAB — CBC
HCT: 46.8 % (ref 39.0–52.0)
Hemoglobin: 15 g/dL (ref 13.0–17.0)
MCH: 26 pg (ref 26.0–34.0)
MCHC: 32.1 g/dL (ref 30.0–36.0)
MCV: 81 fL (ref 80.0–100.0)
Platelets: 200 10*3/uL (ref 150–400)
RBC: 5.78 MIL/uL (ref 4.22–5.81)
RDW: 22.1 % — ABNORMAL HIGH (ref 11.5–15.5)
WBC: 8.6 10*3/uL (ref 4.0–10.5)
nRBC: 0 % (ref 0.0–0.2)

## 2022-01-11 MED ORDER — TORSEMIDE 20 MG PO TABS
40.0000 mg | ORAL_TABLET | Freq: Two times a day (BID) | ORAL | Status: DC
  Administered 2022-01-11 – 2022-01-12 (×2): 40 mg via ORAL
  Filled 2022-01-11 (×2): qty 2

## 2022-01-11 NOTE — Progress Notes (Signed)
[facility]  ROUNDING NOTE   Subjective:   Hector [name] is a 56 y.o. male with past medical conditions including hypertension, paroxysmal A-fib on Eliquis, CHF with a EF less than 20%, and chronic kidney disease stage IV.  Patient presents to the emergency department with shortness of breath and increased lower extremity edema. Patient has been admitted for Acute respiratory failure with hypoxia ([place]) [J96.01] Atrial fibrillation with RVR ([place]) [I48.91] Acute on chronic combined systolic (congestive) and diastolic (congestive) heart failure (HCC) [I50.43] Acute on chronic congestive heart failure, unspecified heart failure type [facility]) [I50.9]  Patient is known to our clinic from previous admissions.  He is followed outpatient by [facility] nephrology.    Patient seen resting quietly, alert and oriented Tolerating meals without nausea and vomiting No lower extremity edema  Urine output 3.2 L in preceding 24 hours Creatinine 3.05   Objective:  Vital signs in last 24 hours:  Temp:  [97.5 F (36.4 C)-99.8 F (37.7 C)] 98.4 F (36.9 C) (11/19 0819) Pulse Rate:  [47-100] 85 (11/19 0819) Resp:  [18-20] 18 (11/19 0819) BP: (93-133)/(69-96) 112/90 (11/19 0819) SpO2:  [91 %-100 %] 95 % (11/19 0819) Weight:  [112.9 kg] 112.9 kg (11/19 0500)  Weight change: 0.3 kg Filed Weights   01/08/22 0500 01/09/22 0944 01/11/22 0500  Weight: 112.8 kg 112.6 kg 112.9 kg    Intake/Output: I/O last 3 completed shifts: In: 1200 [P.O.:1200] Out: 3725 [[id number]]   Intake/Output this shift:  No intake/output data recorded.  Physical Exam: General: NAD  Head: Normocephalic, atraumatic. Moist oral mucosal membranes  Eyes: Anicteric  Lungs:  Clear to auscultation, normal effort, room air  Heart: Regular rate and rhythm  Abdomen:  Soft, nontender, distended  Extremities:  no peripheral edema.  Neurologic: Nonfocal, moving all four extremities  Skin: No lesions  Access: None     Basic Metabolic Panel: Recent Labs  Lab 01/07/22 0409 01/08/22 0554 01/09/22 0657 01/10/22 0606 01/11/22 0453  NA 136 138 138 136 133*  K 3.6 3.6 3.7 3.7 4.6  CL 101 102 103 98 101  CO2 26 25 26 25  21*  GLUCOSE 135* 122* 133* 113* 112*  BUN 50* 50* 52* 51* 54*  CREATININE 3.41* 3.46* 3.23* 3.12* 3.05*  CALCIUM 8.8* 9.0 8.7* 8.9 8.6*     Liver Function Tests: Recent Labs  Lab 01/06/22 1048  AST 24  ALT 12  ALKPHOS 70  BILITOT 2.5*  PROT 7.1  ALBUMIN 3.6    No results for input(s): "LIPASE", "AMYLASE" in the last 168 hours. No results for input(s): "AMMONIA" in the last 168 hours.  CBC: Recent Labs  Lab 01/06/22 1048 01/07/22 0409 01/11/22 0453  WBC 8.2 7.8 8.6  HGB 14.5 15.0 15.0  HCT 46.3 48.2 46.8  MCV 82.2 82.0 81.0  PLT 235 240 200     Cardiac Enzymes: No results for input(s): "CKTOTAL", "CKMB", "CKMBINDEX", "TROPONINI" in the last 168 hours.  BNP: Invalid input(s): "POCBNP"  CBG: No results for input(s): "GLUCAP" in the last 168 hours.  Microbiology: Results for orders placed or performed during the hospital encounter of 10/13/21  Resp Panel by RT-PCR (Flu A&B, Covid) Anterior Nasal Swab     Status: None   Collection Time: 10/13/21  3:50 PM   Specimen: Anterior Nasal Swab  Result Value Ref Range Status   SARS Coronavirus 2 by RT PCR NEGATIVE NEGATIVE Final    Comment: (NOTE) SARS-CoV-2 target nucleic acids are NOT DETECTED.  The SARS-CoV-2 RNA  is generally detectable in upper respiratory specimens during the acute phase of infection. The lowest concentration of SARS-CoV-2 viral copies this assay can detect is 138 copies/mL. A negative result does not preclude SARS-Cov-2 infection and should not be used as the sole basis for treatment or other patient management decisions. A negative result may occur with  improper specimen collection/handling, submission of specimen other than nasopharyngeal swab, presence of viral mutation(s) within  the areas targeted by this assay, and inadequate number of viral copies(<138 copies/mL). A negative result must be combined with clinical observations, patient history, and epidemiological information. The expected result is Negative.  Fact Sheet for Patients:  EntrepreneurPulse.com.au  Fact Sheet for Healthcare Providers:  IncredibleEmployment.be  This test is no t yet approved or cleared by the [place] FDA and  has been authorized for detection and/or diagnosis of SARS-CoV-2 by FDA under an Emergency Use Authorization (EUA). This EUA will remain  in effect (meaning this test can be used) for the duration of the COVID-19 declaration under Section 564(b)(1) of the Act, 21 U.S.C.section 360bbb-3(b)(1), unless the authorization is terminated  or revoked sooner.       Influenza A by PCR NEGATIVE NEGATIVE Final   Influenza B by PCR NEGATIVE NEGATIVE Final    Comment: (NOTE) The Xpert Xpress SARS-CoV-2/FLU/RSV plus assay is intended as an aid in the diagnosis of influenza from Nasopharyngeal swab specimens and should not be used as a sole basis for treatment. Nasal washings and aspirates are unacceptable for Xpert Xpress SARS-CoV-2/FLU/RSV testing.  Fact Sheet for Patients: EntrepreneurPulse.com.au  Fact Sheet for Healthcare Providers: IncredibleEmployment.be  This test is not yet approved or cleared by the [place] FDA and has been authorized for detection and/or diagnosis of SARS-CoV-2 by FDA under an Emergency Use Authorization (EUA). This EUA will remain in effect (meaning this test can be used) for the duration of the COVID-19 declaration under Section 564(b)(1) of the Act, 21 U.S.C. section 360bbb-3(b)(1), unless the authorization is terminated or revoked.  Performed at [facility], [address] [place], [place] [address]     Coagulation Studies: No results for input(s):  "LABPROT", "INR" in the last 72 hours.  Urinalysis: No results for input(s): "COLORURINE", "LABSPEC", "PHURINE", "GLUCOSEU", "HGBUR", "BILIRUBINUR", "KETONESUR", "PROTEINUR", "UROBILINOGEN", "NITRITE", "LEUKOCYTESUR" in the last 72 hours.  Invalid input(s): "APPERANCEUR"    Imaging: No results found.   Medications:     allopurinol  50 mg Oral Daily   apixaban  5 mg Oral BID   atorvastatin  80 mg Oral Daily   carvedilol  6.25 mg Oral BID WC   ferrous gluconate  324 mg Oral Q breakfast   insulin aspart  0-5 Units Subcutaneous QHS   insulin aspart  0-9 Units Subcutaneous TID WC   levothyroxine  100 mcg Oral QAC breakfast   potassium chloride SA  20 mEq Oral Daily   torsemide  40 mg Oral BID   acetaminophen **OR** acetaminophen, ondansetron **OR** ondansetron (ZOFRAN) IV  Assessment/ Plan:  [name] is a 56 y.o.  male past medical conditions including hypertension, paroxysmal A-fib on Eliquis, CHF with a EF less than 20%, and chronic kidney disease stage IV.  Patient presents to the emergency department with shortness of breath and increased lower extremity edema. Patient has been admitted for Acute respiratory failure with hypoxia ([place]) [J96.01] Atrial fibrillation with RVR ([place]) [I48.91] Acute on chronic combined systolic (congestive) and diastolic (congestive) heart failure (HCC) [I50.43] Acute on chronic congestive heart failure,  unspecified heart failure type ([place]) [I50.9]   Acute Kidney Injury on chronic kidney disease stage IV with baseline creatinine 2.81 and GFR of 25 on 12/12/2021.  Acute kidney injury secondary to cardiorenal syndrome.  Creatinine continues to improve with IV furosemide.  No acute need for dialysis.  We will continue to monitor.  Patient will need follow-up appointment with [facility] nephrology at discharge.   Lab Results  Component Value Date   CREATININE 3.05 (H) 01/11/2022   CREATININE 3.12 (H) 01/10/2022   CREATININE 3.23 (H) 01/09/2022     Intake/Output Summary (Last 24 hours) at 01/11/2022 1115 Last data filed at 01/11/2022 0416 Gross per 24 hour  Intake 720 ml  Output 2775 ml  Net -2055 ml    2. Anemia of chronic kidney disease Lab Results  Component Value Date   HGB 15.0 01/11/2022    Hemoglobin remains acceptable.  3. Chronic systolic and diastolic heart failure.  Echo from 12/08/2021 shows EF less than 20% with a moderate to severe mitral valve regurgitation.  We will transition to torsemide at discharge.  Will defer transition to cardiology.  4. 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 Home regimen includes Wilder Glade, held. Most recent hemoglobin A1c is 7.2 on 11/19/21.    LOS: 5   11/19/202311:15 AM

## 2022-01-11 NOTE — Progress Notes (Signed)
Rounding Note    Patient Name: Hector [name] Date of Encounter: 01/11/2022  [facility] Cardiologist: Hector Rogue, MD   Subjective   Feeling well.  Breathing is back to baseline.  Denies chest pain or pressure.  Inpatient Medications    Scheduled Meds:  allopurinol  50 mg Oral Daily   apixaban  5 mg Oral BID   atorvastatin  80 mg Oral Daily   carvedilol  6.25 mg Oral BID WC   ferrous gluconate  324 mg Oral Q breakfast   furosemide  80 mg Intravenous BID   insulin aspart  0-5 Units Subcutaneous QHS   insulin aspart  0-9 Units Subcutaneous TID WC   levothyroxine  100 mcg Oral QAC breakfast   potassium chloride SA  20 mEq Oral Daily   Continuous Infusions:  PRN Meds: acetaminophen **OR** acetaminophen, ondansetron **OR** ondansetron (ZOFRAN) IV   Vital Signs    Vitals:   01/11/22 0059 01/11/22 0415 01/11/22 0500 01/11/22 0819  BP: (!) 133/96 95/77  (!) 112/90  Pulse: 100 96  85  Resp: 20 20  18   Temp: 98.2 F (36.8 C) 98.8 F (37.1 C)  98.4 F (36.9 C)  TempSrc: Oral Oral  Oral  SpO2: 100% 95%  95%  Weight:   112.9 kg   Height:        Intake/Output Summary (Last 24 hours) at 01/11/2022 1019 Last data filed at 01/11/2022 0416 Gross per 24 hour  Intake 720 ml  Output 2775 ml  Net -2055 ml      01/11/2022    5:00 AM 01/09/2022    9:44 AM 01/08/2022    5:00 AM  Last 3 Weights  Weight (lbs) 248 lb 14.4 oz 248 lb 3.8 oz 248 lb 10.9 oz  Weight (kg) 112.9 kg 112.6 kg 112.8 kg      Telemetry    Atrial fibrillation.  PVCs.- Personally Reviewed  ECG    N/s - Personally Reviewed  Physical Exam   VS:  BP (!) 112/90 (BP Location: Right Arm)   Pulse 85   Temp 98.4 F (36.9 C) (Oral)   Resp 18   Ht 5\' 11"  (1.803 m)   Wt 112.9 kg   SpO2 95%   BMI 34.71 kg/m  , BMI Body mass index is 34.71 kg/m. GENERAL:  Well appearing HEENT: Pupils equal round and reactive, fundi not visualized, oral mucosa unremarkable NECK:  No jugular  venous distention, waveform within normal limits, carotid upstroke brisk and symmetric, no bruits, no thyromegaly LUNGS:  Clear to auscultation bilaterally HEART: Irregularly irregular PMI not displaced or sustained,S1 and S2 within normal limits, no S3, no S4, no clicks, no rubs, no murmurs ABD:  Flat, positive bowel sounds normal in frequency in pitch, no bruits, no rebound, no guarding, no midline pulsatile mass, no hepatomegaly, no splenomegaly EXT:  2 plus pulses throughout, no edema, no cyanosis no clubbing SKIN:  No rashes no nodules NEURO:  Cranial nerves II through XII grossly intact, motor grossly intact throughout PSYCH:  Cognitively intact, oriented to person place and time   Labs    High Sensitivity Troponin:   Recent Labs  Lab 01/06/22 1048 01/06/22 1610 01/07/22 0409 01/07/22 0513  TROPONINIHS 61* 71* 64* 56*     Chemistry Recent Labs  Lab 01/06/22 1048 01/07/22 0409 01/09/22 0657 01/10/22 0606 01/11/22 0453  NA 137   < > 138 136 133*  K 4.7   < > 3.7 3.7 4.6  CL  104   < > 103 98 101  CO2 21*   < > 26 25 21*  GLUCOSE 108*   < > 133* 113* 112*  BUN 41*   < > 52* 51* 54*  CREATININE 3.10*   < > 3.23* 3.12* 3.05*  CALCIUM 8.9   < > 8.7* 8.9 8.6*  PROT 7.1  --   --   --   --   ALBUMIN 3.6  --   --   --   --   AST 24  --   --   --   --   ALT 12  --   --   --   --   ALKPHOS 70  --   --   --   --   BILITOT 2.5*  --   --   --   --   GFRNONAA 23*   < > 21* 22* 23*  ANIONGAP 12   < > 9 13 11    < > = values in this interval not displayed.    Lipids  Recent Labs  Lab 01/09/22 0657  CHOL 141  TRIG 140  HDL 25*  LDLCALC 88  CHOLHDL 5.6    Hematology Recent Labs  Lab 01/06/22 1048 01/07/22 0409 01/11/22 0453  WBC 8.2 7.8 8.6  RBC 5.63 5.88* 5.78  HGB 14.5 15.0 15.0  HCT 46.3 48.2 46.8  MCV 82.2 82.0 81.0  MCH 25.8* 25.5* 26.0  MCHC 31.3 31.1 32.1  RDW 22.9* 22.9* 22.1*  PLT 235 240 200   Thyroid No results for input(s): "TSH", "FREET4" in the  last 168 hours.  BNP Recent Labs  Lab 01/06/22 1048  BNP 1,949.1*    DDimer No results for input(s): "DDIMER" in the last 168 hours.   Radiology    No results found.  Cardiac Studies   Echo 12/08/2021: 1. Left ventricular ejection fraction, by estimation, is <20%. The left  ventricle has severely decreased function. The left ventricle has no  regional wall motion abnormalities. The left ventricular internal cavity  size was mildly to moderately dilated.   2. Right ventricular systolic function is normal. The right ventricular  size is normal. There is mildly elevated pulmonary artery systolic  pressure.   3. Left atrial size was severely dilated.   4. Right atrial size was mildly dilated.   5. The mitral valve is normal in structure. Moderate to severe mitral  valve regurgitation. No evidence of mitral stenosis.   6. The aortic valve is normal in structure. Aortic valve regurgitation is  not visualized. No aortic stenosis is present.   7. Moderately dilated pulmonary artery.   8. The inferior vena cava is dilated in size with <50% respiratory  variability, suggesting right atrial pressure of 15 mmHg.   Patient Profile      [name] is a 89M with chronic systolic and diastolic heart failure (LVEF less than 20%), nonischemic cardiomyopathy, alcohol abuse, hypertension, hyperlipidemia, persistent atrial fibrillation/flutter, ddiabetes, GI bleed, OSA, CKD 3-4 and nonadherence admitted with acute on chronic systolic diastolic heart failure.     Assessment & Plan    #Acute on chronic systolic diastolic heart failure: Symptoms are improving with diuresis.  He feels like he is back to baseline.  BUN increasing but creatinine decreasing.  He was net -2L yesterday.  Negative 8.9L since admission.  At home he was prescribed torsemide 40 mg twice daily but was taking it daily.  We will put him on twice daily dosing  to start this afternoon.  ReDS vest tomorrow.   Blood pressure too  low for GDMT and renal function prohibits additional therapy.  SGLT2 inhibitor held in the setting of renal dysfunction.  Continue carvedilol.  #Persistent atrial fibrillation/flutter: Continue Eliquis and carvedilol.  Rates are somewhat higher today.  This may be a sign he is starting to get dry.   # Hyperlipidemia: Continue statin.  Disposition-likely home tomorrow if stable.  For questions or updates, please contact [place] Please consult [email or web address] for contact info under        Signed, Skeet Latch, MD  01/11/2022, 10:19 AM

## 2022-01-11 NOTE — Progress Notes (Signed)
Progress Note   Patient: Hector [name] NAT:[id number] DOB: 07/22/1965 DOA: 01/06/2022     5 DOS: the patient was seen and examined on 01/11/2022   Brief hospital course: Confirm H&P.  [name] is a 56 y.o. male with medical history significant for nonischemic cardiomyopathy with last known LVEF less than 20%, stage IV chronic kidney disease, hypertension, paroxysmal atrial fibrillation on chronic anticoagulation therapy, obesity hypothyroidism who presents to the ER for evaluation of increased abdominal girth, lower extremity swelling and exertional Shortness of Breath. Patient states that he has been compliant with his medications but admits to dietary indiscretion.  He feels that he has gained weight but is unable to tell me how much He has had several hospitalizations in the last 2 months for acute CHF and was recently discharged on 12/12/21.  ED course.  Found to be in A-fib with RVR hypoxic requiring up to 4 L of oxygen.  Labs pertinent for BNP 1949, troponin 61 Chest x-ray with cardiomegaly and pulmonary vascular congestion, more focal opacities in the left upper lobe may reflect alveolar edema or superimposed pneumonia. Patient received 80 mg of IV Lasix and admitted for further management.  11/15: Hemodynamically stable, ongoing mania, still feeling short of breath, no significant peripheral edema, mildly distended abdomen.  Creatinine bumped to 3.41 after getting IV Lasix yesterday.  Per cardiology might be due to cardiorenal, not much significant urinary output recorded, they increased the dose of IV Lasix to 80 mg twice daily. Nephrology is also on board.  11/16: Hemodynamically stable, renal function seems stable with urinary output of 2400 after increasing the dose of Lasix.  Weight decreased to 248lbs. cardiology would like to continue IV diuresis for another day.  Patient will be transition to torsemide 40 mg twice daily as recommended by nephrology on discharge.   Might get benefit from follow-up at advance heart failure clinic with Dr. Haroldine Laws.    11/17: Dr. Haroldine Laws saw him today recommending continuation of IV diuretics to make him dry as much as possible, most likely had end-stage heart failure, he was also recommending ZIO monitor to watch for heart rate and A-fib.  Can consider starting amiodarone.  Should go back on Farxiga on discharge. Renal functions with slight improvement today, urinary output of 3600 recorded with net negative of -5 L.  Weight seems stable at 248.  11/18: Hemodynamically stable, urinary output recorded at 2025, with net negative of -6815, renal functions with slight improvement with creatinine of 3.12 and GFR 22.  11/19: Remained hemodynamically stable, UOP at 3275 over the past 24 hours with net negative of -8890.  Creatinine with slight improvement and BUN with some worsening.  Cardiology is going to start on p.o. diuretic later today and if renal functions remained stable can be discharged tomorrow with a close follow-up.  Patient will remain high risk for deterioration, mortality and readmission based on life limiting underlying comorbidities and a poor insight of his disease and compliance.   Assessment and Plan: * Acute on chronic combined systolic (congestive) and diastolic (congestive) heart failure ([name]) Patient presents for evaluation of increased abdominal girth, exertional shortness of breath and lower extremity swelling. Also noted to be in rapid A-fib with a rapid ventricular rate His last known LVEF is less than 20% from a 2D echocardiogram which was done in 11/2021.  Patient received IV Lasix in ED followed by continuation of 40 mg twice daily. Renal function with slight improvement in creatinine with some worsening of BUN.  UOP of 3275 over last 24 hours. -Advanced heart failure team evaluated him -Cardiology would like to continue with IV Lasix 80 mg in the morning and most likely will switch to p.o. from  afternoon -Continue with carvedilol -Daily BMP and weight -Strict intake and output  Atrial fibrillation with RVR (HCC) History of paroxysmal A-fib, going in and out of RVR Continue carvedilol for rate control -Cardiology might consider starting amiodarone Continue Eliquis as primary prophylaxis for an acute stroke  Chronic renal impairment, stage 4 (severe) ([name]) Renal functions currently stable. Nephrology is following -Monitor renal function while he is being diuresed. 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   Weight as of this encounter: 115.5 kg.   Complicates overall prognosis and care Lifestyle modification and exercise has been discussed with patient in detail   Subjective: Patient was resting comfortably when seen today.  No new complaints.  Physical Exam: Vitals:   01/11/22 0415 01/11/22 0500 01/11/22 0819 01/11/22 1220  BP: 95/77  (!) 112/90 109/85  Pulse: 96  85 (!) 112  Resp: 20  18 20   Temp: 98.8 F (37.1 C)  98.4 F (36.9 C) 98.6 F (37 C)  TempSrc: Oral  Oral Oral  SpO2: 95%  95% 99%  Weight:  112.9 kg    Height:       General.  Obese gentleman, in no acute distress. Pulmonary.  Lungs clear bilaterally, normal respiratory effort. CV.  Regular rate and rhythm, no JVD, rub or murmur. Abdomen.  Soft, nontender, nondistended, BS positive. CNS.  Alert and oriented .  No focal neurologic deficit. Extremities.  No edema, no cyanosis, pulses intact and symmetrical. Psychiatry.  Judgment and insight appears normal.     Data Reviewed: Prior data reviewed  Family Communication: Discussed with  patient  Disposition: Status is: Inpatient Remains inpatient appropriate because: Severity of illness  Planned Discharge Destination: Home  ED prophylaxis.  Eliquis Time spent: 40 minutes  This record has been created using Systems analyst. Errors have been sought and corrected,but may not always be located. Such creation errors do not reflect on the standard of care.   Author: Lorella Nimrod, MD 01/11/2022 1:45 PM  For on call review [email or web address].

## 2022-01-12 DIAGNOSIS — I5043 Acute on chronic combined systolic (congestive) and diastolic (congestive) heart failure: Secondary | ICD-10-CM | POA: Diagnosis not present

## 2022-01-12 LAB — BASIC METABOLIC PANEL
Anion gap: 9 (ref 5–15)
BUN: 60 mg/dL — ABNORMAL HIGH (ref 6–20)
CO2: 26 mmol/L (ref 22–32)
Calcium: 9 mg/dL (ref 8.9–10.3)
Chloride: 101 mmol/L (ref 98–111)
Creatinine, Ser: 3.25 mg/dL — ABNORMAL HIGH (ref 0.61–1.24)
GFR, Estimated: 21 mL/min — ABNORMAL LOW (ref >60)
Glucose, Bld: 126 mg/dL — ABNORMAL HIGH (ref 70–99)
Potassium: 4.4 mmol/L (ref 3.5–5.1)
Sodium: 136 mmol/L (ref 135–145)

## 2022-01-12 MED ORDER — TORSEMIDE 20 MG PO TABS: 60.0000 mg | ORAL_TABLET | Freq: Two times a day (BID) | ORAL | 1 refills | Status: DC

## 2022-01-12 NOTE — TOC Transition Note (Signed)
Transition of Care [facility]) - CM/SW Discharge Note   Patient Details  Name: Hector [name] MRN: [id number] Date of Birth: [birth year]  Transition of Care [facility]) CM/SW Contact:  Candie Chroman, LCSW Phone Number: 01/12/2022, 11:04 AM   Clinical Narrative: Patient has orders to discharge home today. No further concerns. CSW signing off.    Final next level of care: Home/Self Care Barriers to Discharge: Barriers Resolved   Patient Goals and CMS Choice Patient states their goals for this hospitalization and ongoing recovery are:: to go home CMS Medicare.gov Compare Post Acute Care list provided to:: Patient Choice offered to / list presented to : Patient  Discharge Placement                Patient to be transferred to facility by: Likely brother   Patient and family notified of of transfer: 01/12/22  Discharge Plan and Services                                     Social Determinants of Health (SDOH) Interventions     Readmission Risk Interventions    12/09/2021    1:09 PM 10/14/2021    1:24 PM 03/04/2021   10:16 AM  Readmission Risk Prevention Plan  Transportation Screening Complete Complete Complete  PCP or Specialist Appt within 3-5 Days  Complete Complete  HRI or [place]   Complete  Social Work Consult for [place] Planning/Counseling  Complete Complete  Palliative Care Screening  Not Applicable Not Applicable  Medication Review Press photographer) Complete Complete Complete  PCP or Specialist appointment within 3-5 days of discharge Complete    SW Recovery Care/Counseling Consult Complete    [place] Not Applicable

## 2022-01-12 NOTE — Progress Notes (Signed)
reds clip vest 48%.

## 2022-01-12 NOTE — Discharge Summary (Signed)
Physician Discharge Summary   Patient: Hector [name] MRN: [id number] DOB: 09-Apr-1965  Admit date:     01/06/2022  Discharge date: 01/12/22  Discharge Physician: Lorella Nimrod   PCP: [name], MD   Recommendations at discharge:  Please obtain CBC and BMP during next follow-up appointment Follow-up with advanced heart failure clinic Follow-up with primary care provider Follow-up with nephrology  Discharge Diagnoses: Principal Problem:   Acute on chronic combined systolic (congestive) and diastolic (congestive) heart failure ([place]) Active Problems:   Atrial fibrillation with RVR ([place])   Chronic renal impairment, stage 4 (severe) ([place])   Hypothyroidism   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  Hector [name] is a 56 y.o. male with medical history significant for nonischemic cardiomyopathy with last known LVEF less than 20%, stage IV chronic kidney disease, hypertension, paroxysmal atrial fibrillation on chronic anticoagulation therapy, obesity hypothyroidism who presents to the ER for evaluation of increased abdominal girth, lower extremity swelling and exertional Shortness of Breath. Patient states that he has been compliant with his medications but admits to dietary indiscretion.  He feels that he has gained weight but is unable to tell me how much He has had several hospitalizations in the last 2 months for acute CHF and was recently discharged on 12/12/21.  ED course.  Found to be in A-fib with RVR hypoxic requiring up to 4 L of oxygen.  Labs pertinent for BNP 1949, troponin 61 Chest x-ray with cardiomegaly and pulmonary vascular congestion, more focal opacities in the left upper lobe may reflect alveolar edema or superimposed pneumonia. Patient received 80 mg of IV Lasix and admitted for further management.  11/15: Hemodynamically stable, ongoing mania,  still feeling short of breath, no significant peripheral edema, mildly distended abdomen.  Creatinine bumped to 3.41 after getting IV Lasix yesterday.  Per cardiology might be due to cardiorenal, not much significant urinary output recorded, they increased the dose of IV Lasix to 80 mg twice daily. Nephrology is also on board.  11/16: Hemodynamically stable, renal function seems stable with urinary output of 2400 after increasing the dose of Lasix.  Weight decreased to 248lbs. cardiology would like to continue IV diuresis for another day.  Patient will be transition to torsemide 40 mg twice daily as recommended by nephrology on discharge.  Might get benefit from follow-up at advance heart failure clinic with Dr. Haroldine Laws.    11/17: Dr. Haroldine Laws saw him today recommending continuation of IV diuretics to make him dry as much as possible, most likely had end-stage heart failure, he was also recommending ZIO monitor to watch for heart rate and A-fib.  Can consider starting amiodarone.  Should go back on Farxiga on discharge. Renal functions with slight improvement today, urinary output of 3600 recorded with net negative of -5 L.  Weight seems stable at 248.  11/18: Hemodynamically stable, urinary output recorded at 2025, with net negative of -6815, renal functions with slight improvement with creatinine of 3.12 and GFR 22.  11/19: Remained hemodynamically stable, UOP at 3275 over the past 24 hours with net negative of -8890.  Creatinine with slight improvement and BUN with some worsening.  Cardiology is going to start on p.o. diuretic later today and if renal functions remained stable can be discharged tomorrow with a close follow-up.  11/20: Renal function with slight worsening of BUN of 60 and creatinine of  3.25.  IV Lasix was switched with torsemide.  Patient is being discharged on current medications and need to have a very close follow-up with advanced heart failure clinic and nephrology for further  recommendations.  Patient will remain high risk for deterioration, mortality and readmission based on life limiting underlying comorbidities and a poor insight of his disease and compliance.   Assessment and Plan: * Acute on chronic combined systolic (congestive) and diastolic (congestive) heart failure ([place]) Patient presents for evaluation of increased abdominal girth, exertional shortness of breath and lower extremity swelling. Also noted to be in rapid A-fib with a rapid ventricular rate His last known LVEF is less than 20% from a 2D echocardiogram which was done in 11/2021.  Patient received IV Lasix in ED followed by continuation of 40 mg twice daily. Renal function with slight improvement in creatinine with some worsening of BUN.  UOP of 3275 over last 24 hours. -Advanced heart failure team evaluated him -Cardiology would like to continue with IV Lasix 80 mg in the morning and most likely will switch to p.o. from afternoon -Continue with carvedilol -Daily BMP and weight -Strict intake and output  Atrial fibrillation with RVR (HCC) History of paroxysmal A-fib, going in and out of RVR Continue carvedilol for rate control -Cardiology might consider starting amiodarone Continue Eliquis as primary prophylaxis for an acute stroke  Chronic renal impairment, stage 4 (severe) ([place]) Renal functions currently stable. Nephrology is following -Monitor renal function while he is being diuresed. 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   Weight as of this encounter: 115.5 kg.   Complicates overall prognosis and care Lifestyle  modification and exercise has been discussed with patient in detail   Consultants: Cardiology, nephrology Procedures performed: None Disposition: Home Diet recommendation:  Discharge Diet Orders (From admission, onward)     Start     Ordered   01/12/22 0000  Diet - low sodium heart healthy        01/12/22 1054           Cardiac diet DISCHARGE MEDICATION: Allergies as of 01/12/2022       Reactions   Esomeprazole Magnesium Other (See Comments)   Suspected interstitial nephritis 2018   Eggs Or Egg-derived Products Rash        Medication List     STOP taking these medications    ferrous sulfate 325 (65 FE) MG tablet       TAKE these medications    allopurinol 100 MG tablet Commonly known as: ZYLOPRIM Take 0.5 tablets (50 mg total) by mouth daily.   apixaban 5 MG Tabs tablet Commonly known as: ELIQUIS Take 1 tablet (5 mg total) by mouth 2 (two) times daily.   atorvastatin 80 MG tablet Commonly known as: LIPITOR Take 1 tablet (80 mg total) by mouth daily.   carvedilol 6.25 MG tablet Commonly known as: COREG Take 1 tablet (6.25 mg total) by mouth 2 (two) times daily with a meal.   dapagliflozin propanediol 10 MG Tabs tablet Commonly known as: Farxiga Take 1 tablet (10 mg total) by mouth daily before breakfast.   ferrous gluconate 324 MG  tablet Commonly known as: FERGON Take 324 mg by mouth daily with breakfast. What changed: Another medication with the same name was removed. Continue taking this medication, and follow the directions you see here.   levothyroxine 100 MCG tablet Commonly known as: SYNTHROID Take 1 tablet (100 mcg total) by mouth daily before breakfast.   potassium chloride SA 20 MEQ tablet Commonly known as: KLOR-CON M Take 1 tablet (20 mEq total) by mouth daily.   torsemide 20 MG tablet Commonly known as: DEMADEX Take 3 tablets (60 mg total) by mouth 2 (two) times daily. What changed:  medication strength how much to take         Follow-up Information     Bensimhon, Shaune Pascal, MD Follow up on 01/22/2022.   Specialty: Cardiology Why: 11:40 AM Contact information: [place] [address] [phone]         [name], MD. Schedule an appointment as soon as possible for a visit in 1 week(s).   Specialty: Family Medicine Contact information: [place] [address] [phone]                Discharge Exam: Filed Weights   01/08/22 0500 01/09/22 0944 01/11/22 0500  Weight: 112.8 kg 112.6 kg 112.9 kg   General.  Obese gentleman ,in no acute distress. Pulmonary.  Lungs clear bilaterally, normal respiratory effort. CV.  Regular rate and rhythm, no JVD, rub or murmur. Abdomen.  Soft, nontender, nondistended, BS positive. CNS.  Alert and oriented .  No focal neurologic deficit. Extremities.  No edema, no cyanosis, pulses intact and symmetrical. Psychiatry.  Judgment and insight appears normal.   Condition at discharge: stable  The results of significant diagnostics from this hospitalization (including imaging, microbiology, ancillary and laboratory) are listed below for reference.   Imaging Studies: CT CHEST WO CONTRAST  Result Date: 01/06/2022 CLINICAL DATA:  Atrial fibrillation, lower extremity edema, short of breath EXAM: CT CHEST WITHOUT CONTRAST TECHNIQUE: Multidetector CT imaging of the chest was performed following the standard protocol without IV contrast. RADIATION DOSE REDUCTION: This exam was performed according to the departmental dose-optimization program which includes automated exposure control, adjustment of the mA and/or kV according to patient size and/or use of iterative reconstruction technique. COMPARISON:  01/06/2022, 12/07/2021 FINDINGS: Cardiovascular: Unenhanced imaging demonstrates an enlarged heart without pericardial effusion. Normal caliber of the thoracic aorta. Atherosclerosis of the aorta and  coronary vasculature. Evaluation of the vascular lumen is limited without IV contrast. Mediastinum/Nodes: No enlarged mediastinal or axillary lymph nodes. Thyroid gland, trachea, and esophagus demonstrate no significant findings. Lungs/Pleura: Stable bilateral calcified granulomata. Mild subpleural interlobular septal thickening consistent with Kerley lines and mild volume overload. No airspace disease, effusion, or pneumothorax. Central airways are patent. Scattered areas of subpleural scarring are again seen within the right upper and bilateral lower lobes. Upper Abdomen: No acute abnormality. Musculoskeletal: No acute or destructive bony lesions. Hypertrophic changes at the left first costochondral junction likely account for the density reported on recent x-ray. Reconstructed images demonstrate no additional findings. IMPRESSION: 1. Cardiomegaly. 2. Mild subpleural interlobular septal thickening greatest at the bases, consistent with mild fluid overload. No overt edema. 3. Aortic Atherosclerosis (ICD10-I70.0). Coronary artery atherosclerosis. Electronically Signed   By: Randa Ngo M.D.   On: 01/06/2022 15:21   DG Chest Port 1 View  Result Date: 01/06/2022 CLINICAL DATA:  Shortness of breath EXAM: PORTABLE CHEST 1 VIEW COMPARISON:  12/07/2021 FINDINGS: Stable cardiomegaly. Pulmonary vascular  congestion. Prominent perihilar and bibasilar interstitial markings. More focal opacity in the left upper lobe. No large pleural fluid collection. No pneumothorax. IMPRESSION: 1. Cardiomegaly with pulmonary vascular congestion and interstitial edema. 2. More focal opacity in the left upper lobe, which may reflect alveolar edema or superimposed pneumonia. Electronically Signed   By: Davina Poke D.O.   On: 01/06/2022 10:47    Microbiology: Results for orders placed or performed during the hospital encounter of 10/13/21  Resp Panel by RT-PCR (Flu A&B, Covid) Anterior Nasal Swab     Status: None   Collection  Time: 10/13/21  3:50 PM   Specimen: Anterior Nasal Swab  Result Value Ref Range Status   SARS Coronavirus 2 by RT PCR NEGATIVE NEGATIVE Final    Comment: (NOTE) SARS-CoV-2 target nucleic acids are NOT DETECTED.  The SARS-CoV-2 RNA is generally detectable in upper respiratory specimens during the acute phase of infection. The lowest concentration of SARS-CoV-2 viral copies this assay can detect is 138 copies/mL. A negative result does not preclude SARS-Cov-2 infection and should not be used as the sole basis for treatment or other patient management decisions. A negative result may occur with  improper specimen collection/handling, submission of specimen other than nasopharyngeal swab, presence of viral mutation(s) within the areas targeted by this assay, and inadequate number of viral copies(<138 copies/mL). A negative result must be combined with clinical observations, patient history, and epidemiological information. The expected result is Negative.  Fact Sheet for Patients:  EntrepreneurPulse.com.au  Fact Sheet for Healthcare Providers:  IncredibleEmployment.be  This test is no t yet approved or cleared by the [place] FDA and  has been authorized for detection and/or diagnosis of SARS-CoV-2 by FDA under an Emergency Use Authorization (EUA). This EUA will remain  in effect (meaning this test can be used) for the duration of the COVID-19 declaration under Section 564(b)(1) of the Act, 21 U.S.C.section 360bbb-3(b)(1), unless the authorization is terminated  or revoked sooner.       Influenza A by PCR NEGATIVE NEGATIVE Final   Influenza B by PCR NEGATIVE NEGATIVE Final    Comment: (NOTE) The Xpert Xpress SARS-CoV-2/FLU/RSV plus assay is intended as an aid in the diagnosis of influenza from Nasopharyngeal swab specimens and should not be used as a sole basis for treatment. Nasal washings and aspirates are unacceptable for Xpert Xpress  SARS-CoV-2/FLU/RSV testing.  Fact Sheet for Patients: EntrepreneurPulse.com.au  Fact Sheet for Healthcare Providers: IncredibleEmployment.be  This test is not yet approved or cleared by the [place] FDA and has been authorized for detection and/or diagnosis of SARS-CoV-2 by FDA under an Emergency Use Authorization (EUA). This EUA will remain in effect (meaning this test can be used) for the duration of the COVID-19 declaration under Section 564(b)(1) of the Act, 21 U.S.C. section 360bbb-3(b)(1), unless the authorization is terminated or revoked.  Performed at [facility], [address] [place],  [address]     Labs: CBC: Recent Labs  Lab 01/06/22 1048 01/07/22 0409 01/11/22 0453  WBC 8.2 7.8 8.6  HGB 14.5 15.0 15.0  HCT 46.3 48.2 46.8  MCV 82.2 82.0 81.0  PLT 235 240 093   Basic Metabolic Panel: Recent Labs  Lab 01/08/22 0554 01/09/22 0657 01/10/22 0606 01/11/22 0453 01/12/22 0612  NA 138 138 136 133* 136  K 3.6 3.7 3.7 4.6 4.4  CL 102 103 98 101 101  CO2 25 26 25  21* 26  GLUCOSE 122* 133* 113* 112* 126*  BUN 50* 52* 51*  54* 60*  CREATININE 3.46* 3.23* 3.12* 3.05* 3.25*  CALCIUM 9.0 8.7* 8.9 8.6* 9.0   Liver Function Tests: Recent Labs  Lab 01/06/22 1048  AST 24  ALT 12  ALKPHOS 70  BILITOT 2.5*  PROT 7.1  ALBUMIN 3.6   CBG: No results for input(s): "GLUCAP" in the last 168 hours.  Discharge time spent: greater than 30 minutes.  This record has been created using Systems analyst. Errors have been sought and corrected,but may not always be located. Such creation errors do not reflect on the standard of care.   Signed: Lorella Nimrod, MD [facility] 01/12/2022

## 2022-01-12 NOTE — Progress Notes (Signed)
   Heart Failure Nurse Navigator Note  Met with patient today, he was sitting up in the chair at bedside dressed and ready for discharge.  Performed Reds vest clip reading-48%, last Friday was 54%.  Jorja Loa NP and Dr. Reesa Chew  made aware by secure chat.  Also made since nurse Joy aware.  Attempted teaching, made aware of his weight this morning and to base his following home weights on this.  Also discussed his twice a day torsemide.  He states he takes it in the evening. Made aware that he could take it earlier in the afternoon.  He states" why are you doing this I already know this information."  Again today he is making poor eye contact and is very short with his answers.  He had no further questions.  Reminded of follow up with Dr. Missy Sabins on November 30.  Pricilla Riffle RN [facility]

## 2022-01-12 NOTE — Progress Notes (Signed)
[facility]  ROUNDING NOTE   Subjective:   Hector [name] is a 56 y.o. male with past medical conditions including hypertension, paroxysmal A-fib on Eliquis, CHF with a EF less than 20%, and chronic kidney disease stage IV.  Patient presents to the emergency department with shortness of breath and increased lower extremity edema. Patient has been admitted for Acute respiratory failure with hypoxia ([place]) [J96.01] Atrial fibrillation with RVR ([place]) [I48.91] Acute on chronic combined systolic (congestive) and diastolic (congestive) heart failure (HCC) [I50.43] Acute on chronic congestive heart failure, unspecified heart failure type [facility]) [I50.9]  Patient is known to our clinic from previous admissions.  He is followed outpatient by [facility] nephrology.    Patient seen sitting at bedside, alert and oriented.  No Lower extremity edema Room air Appetite appropriate.   Creatinine 3.2   Objective:  Vital signs in last 24 hours:  Temp:  [97.7 F (36.5 C)-99.3 F (37.4 C)] 98.7 F (37.1 C) (11/20 1103) Pulse Rate:  [53-112] 76 (11/20 1103) Resp:  [18-20] 18 (11/20 1103) BP: (103-131)/(79-97) 103/87 (11/20 1103) SpO2:  [94 %-99 %] 99 % (11/20 1103) Weight:  [111.9 kg] 111.9 kg (11/20 1100)  Weight change:  Filed Weights   01/09/22 0944 01/11/22 0500 01/12/22 1100  Weight: 112.6 kg 112.9 kg 111.9 kg    Intake/Output: I/O last 3 completed shifts: In: 69 [P.O.:780] Out: 1950 [Urine:1950]   Intake/Output this shift:  Total I/O In: 240 [P.O.:240] Out: 400 [Urine:400]  Physical Exam: General: NAD  Head: Normocephalic, atraumatic. Moist oral mucosal membranes  Eyes: Anicteric  Lungs:  Clear to auscultation, normal effort, room air  Heart: Regular rate and rhythm  Abdomen:  Soft, nontender, distended  Extremities:  no peripheral edema.  Neurologic: Nonfocal, moving all four extremities  Skin: No lesions  Access: None    Basic Metabolic Panel: Recent Labs   Lab 01/08/22 0554 01/09/22 0657 01/10/22 0606 01/11/22 0453 01/12/22 0612  NA 138 138 136 133* 136  K 3.6 3.7 3.7 4.6 4.4  CL 102 103 98 101 101  CO2 25 26 25  21* 26  GLUCOSE 122* 133* 113* 112* 126*  BUN 50* 52* 51* 54* 60*  CREATININE 3.46* 3.23* 3.12* 3.05* 3.25*  CALCIUM 9.0 8.7* 8.9 8.6* 9.0     Liver Function Tests: Recent Labs  Lab 01/06/22 1048  AST 24  ALT 12  ALKPHOS 70  BILITOT 2.5*  PROT 7.1  ALBUMIN 3.6    No results for input(s): "LIPASE", "AMYLASE" in the last 168 hours. No results for input(s): "AMMONIA" in the last 168 hours.  CBC: Recent Labs  Lab 01/06/22 1048 01/07/22 0409 01/11/22 0453  WBC 8.2 7.8 8.6  HGB 14.5 15.0 15.0  HCT 46.3 48.2 46.8  MCV 82.2 82.0 81.0  PLT 235 240 200     Cardiac Enzymes: No results for input(s): "CKTOTAL", "CKMB", "CKMBINDEX", "TROPONINI" in the last 168 hours.  BNP: Invalid input(s): "POCBNP"  CBG: No results for input(s): "GLUCAP" in the last 168 hours.  Microbiology: Results for orders placed or performed during the hospital encounter of 10/13/21  Resp Panel by RT-PCR (Flu A&B, Covid) Anterior Nasal Swab     Status: None   Collection Time: 10/13/21  3:50 PM   Specimen: Anterior Nasal Swab  Result Value Ref Range Status   SARS Coronavirus 2 by RT PCR NEGATIVE NEGATIVE Final    Comment: (NOTE) SARS-CoV-2 target nucleic acids are NOT DETECTED.  The SARS-CoV-2 RNA is generally detectable in  upper respiratory specimens during the acute phase of infection. The lowest concentration of SARS-CoV-2 viral copies this assay can detect is 138 copies/mL. A negative result does not preclude SARS-Cov-2 infection and should not be used as the sole basis for treatment or other patient management decisions. A negative result may occur with  improper specimen collection/handling, submission of specimen other than nasopharyngeal swab, presence of viral mutation(s) within the areas targeted by this assay, and  inadequate number of viral copies(<138 copies/mL). A negative result must be combined with clinical observations, patient history, and epidemiological information. The expected result is Negative.  Fact Sheet for Patients:  EntrepreneurPulse.com.au  Fact Sheet for Healthcare Providers:  IncredibleEmployment.be  This test is no t yet approved or cleared by the [place] FDA and  has been authorized for detection and/or diagnosis of SARS-CoV-2 by FDA under an Emergency Use Authorization (EUA). This EUA will remain  in effect (meaning this test can be used) for the duration of the COVID-19 declaration under Section 564(b)(1) of the Act, 21 U.S.C.section 360bbb-3(b)(1), unless the authorization is terminated  or revoked sooner.       Influenza A by PCR NEGATIVE NEGATIVE Final   Influenza B by PCR NEGATIVE NEGATIVE Final    Comment: (NOTE) The Xpert Xpress SARS-CoV-2/FLU/RSV plus assay is intended as an aid in the diagnosis of influenza from Nasopharyngeal swab specimens and should not be used as a sole basis for treatment. Nasal washings and aspirates are unacceptable for Xpert Xpress SARS-CoV-2/FLU/RSV testing.  Fact Sheet for Patients: EntrepreneurPulse.com.au  Fact Sheet for Healthcare Providers: IncredibleEmployment.be  This test is not yet approved or cleared by the [place] FDA and has been authorized for detection and/or diagnosis of SARS-CoV-2 by FDA under an Emergency Use Authorization (EUA). This EUA will remain in effect (meaning this test can be used) for the duration of the COVID-19 declaration under Section 564(b)(1) of the Act, 21 U.S.C. section 360bbb-3(b)(1), unless the authorization is terminated or revoked.  Performed at [facility], [address] [place], [place] [address]     Coagulation Studies: No results for input(s): "LABPROT", "INR" in the last 72  hours.  Urinalysis: No results for input(s): "COLORURINE", "LABSPEC", "PHURINE", "GLUCOSEU", "HGBUR", "BILIRUBINUR", "KETONESUR", "PROTEINUR", "UROBILINOGEN", "NITRITE", "LEUKOCYTESUR" in the last 72 hours.  Invalid input(s): "APPERANCEUR"    Imaging: No results found.   Medications:     allopurinol  50 mg Oral Daily   apixaban  5 mg Oral BID   atorvastatin  80 mg Oral Daily   carvedilol  6.25 mg Oral BID WC   ferrous gluconate  324 mg Oral Q breakfast   insulin aspart  0-5 Units Subcutaneous QHS   insulin aspart  0-9 Units Subcutaneous TID WC   levothyroxine  100 mcg Oral QAC breakfast   potassium chloride SA  20 mEq Oral Daily   torsemide  40 mg Oral BID   acetaminophen **OR** acetaminophen, ondansetron **OR** ondansetron (ZOFRAN) IV  Assessment/ Plan:  [name] is a 56 y.o.  male past medical conditions including hypertension, paroxysmal A-fib on Eliquis, CHF with a EF less than 20%, and chronic kidney disease stage IV.  Patient presents to the emergency department with shortness of breath and increased lower extremity edema. Patient has been admitted for Acute respiratory failure with hypoxia ([place]) [J96.01] Atrial fibrillation with RVR ([place]) [I48.91] Acute on chronic combined systolic (congestive) and diastolic (congestive) heart failure (HCC) [I50.43] Acute on chronic congestive heart failure, unspecified heart failure type (  [place]) [I50.9]   Acute Kidney Injury on chronic kidney disease stage IV with baseline creatinine 2.81 and GFR of 25 on 12/12/2021.  Acute kidney injury secondary to cardiorenal syndrome.  Patient transition to torsemide 40 mg twice daily yesterday evening.  Creatinine slightly increased today.   Lab Results  Component Value Date   CREATININE 3.25 (H) 01/12/2022   CREATININE 3.05 (H) 01/11/2022   CREATININE 3.12 (H) 01/10/2022    Intake/Output Summary (Last 24 hours) at 01/12/2022 1150 Last data filed at 01/12/2022 1106 Gross per  24 hour  Intake 780 ml  Output 1025 ml  Net -245 ml    2. Anemia of chronic kidney disease Lab Results  Component Value Date   HGB 15.0 01/11/2022    Hemoglobin appears stable  3. Chronic systolic and diastolic heart failure.  Echo from 12/08/2021 shows EF less than 20% with a moderate to severe mitral valve regurgitation.  Cardiology transition patient to torsemide as above.  4. 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 Home regimen includes [place], held. Most recent hemoglobin A1c is 7.2 on 11/19/21.  Diet controlled.   LOS: [place] 11/20/202311:50 AM

## 2022-01-20 ENCOUNTER — Ambulatory Visit: Payer: Medicaid Other | Admitting: Family

## 2022-01-20 ENCOUNTER — Telehealth (HOSPITAL_COMMUNITY): Payer: Self-pay

## 2022-01-20 NOTE — Telephone Encounter (Signed)
Attempted to contact, no answer left message to rimind him of appt on Thursday with Hf clinic and to set up a home visit.  Left my info to return my call.    [place] [phone]

## 2022-01-21 ENCOUNTER — Telehealth (HOSPITAL_COMMUNITY): Payer: Self-pay

## 2022-01-21 NOTE — Telephone Encounter (Signed)
Hector [name] answered and states he just woke up.  He states been doing ok.  Reminded him of his HF clinic appt tomorrow and he advised he could not

## 2022-01-22 ENCOUNTER — Encounter: Payer: Medicaid Other | Admitting: Internal Medicine

## 2022-01-22 NOTE — Telephone Encounter (Signed)
Shaquill states he is unable to make a morning appt, HF clinic will reschedule his appt.  He need after noon time.  His brother transports him to appts, he does not drive.   [place] [phone]

## 2022-01-23 ENCOUNTER — Inpatient Hospital Stay
Admission: EM | Admit: 2022-01-23 | Discharge: 2022-01-27 | DRG: 291 | Disposition: A | Discharge status: Home / Self Care | Payer: Medicaid Other | Attending: Internal Medicine | Admitting: Internal Medicine

## 2022-01-23 ENCOUNTER — Other Ambulatory Visit: Payer: Self-pay

## 2022-01-23 ENCOUNTER — Emergency Department: Payer: Medicaid Other

## 2022-01-23 DIAGNOSIS — N390 Urinary tract infection, site not specified: Secondary | ICD-10-CM | POA: Diagnosis present

## 2022-01-23 DIAGNOSIS — E669 Obesity, unspecified: Secondary | ICD-10-CM | POA: Diagnosis present

## 2022-01-23 DIAGNOSIS — B952 Enterococcus as the cause of diseases classified elsewhere: Secondary | ICD-10-CM | POA: Diagnosis present

## 2022-01-23 DIAGNOSIS — Z87891 Personal history of nicotine dependence: Secondary | ICD-10-CM

## 2022-01-23 DIAGNOSIS — N2889 Other specified disorders of kidney and ureter: Secondary | ICD-10-CM | POA: Diagnosis present

## 2022-01-23 DIAGNOSIS — Z7901 Long term (current) use of anticoagulants: Secondary | ICD-10-CM

## 2022-01-23 DIAGNOSIS — Z8249 Family history of ischemic heart disease and other diseases of the circulatory system: Secondary | ICD-10-CM

## 2022-01-23 DIAGNOSIS — E039 Hypothyroidism, unspecified: Secondary | ICD-10-CM | POA: Diagnosis present

## 2022-01-23 DIAGNOSIS — D509 Iron deficiency anemia, unspecified: Secondary | ICD-10-CM | POA: Diagnosis present

## 2022-01-23 DIAGNOSIS — I130 Hypertensive heart and chronic kidney disease with heart failure and stage 1 through stage 4 chronic kidney disease, or unspecified chronic kidney disease: Principal | ICD-10-CM | POA: Diagnosis present

## 2022-01-23 DIAGNOSIS — E1122 Type 2 diabetes mellitus with diabetic chronic kidney disease: Secondary | ICD-10-CM | POA: Diagnosis present

## 2022-01-23 DIAGNOSIS — N184 Chronic kidney disease, stage 4 (severe): Secondary | ICD-10-CM | POA: Diagnosis present

## 2022-01-23 DIAGNOSIS — F1011 Alcohol abuse, in remission: Secondary | ICD-10-CM | POA: Diagnosis present

## 2022-01-23 DIAGNOSIS — I509 Heart failure, unspecified: Principal | ICD-10-CM

## 2022-01-23 DIAGNOSIS — E785 Hyperlipidemia, unspecified: Secondary | ICD-10-CM | POA: Diagnosis present

## 2022-01-23 DIAGNOSIS — I5023 Acute on chronic systolic (congestive) heart failure: Secondary | ICD-10-CM | POA: Diagnosis present

## 2022-01-23 DIAGNOSIS — E119 Type 2 diabetes mellitus without complications: Secondary | ICD-10-CM | POA: Diagnosis present

## 2022-01-23 DIAGNOSIS — I10 Essential (primary) hypertension: Secondary | ICD-10-CM | POA: Diagnosis present

## 2022-01-23 DIAGNOSIS — I480 Paroxysmal atrial fibrillation: Secondary | ICD-10-CM | POA: Diagnosis present

## 2022-01-23 DIAGNOSIS — I426 Alcoholic cardiomyopathy: Secondary | ICD-10-CM | POA: Diagnosis present

## 2022-01-23 DIAGNOSIS — I639 Cerebral infarction, unspecified: Secondary | ICD-10-CM | POA: Diagnosis present

## 2022-01-23 DIAGNOSIS — Z8349 Family history of other endocrine, nutritional and metabolic diseases: Secondary | ICD-10-CM

## 2022-01-23 DIAGNOSIS — I5043 Acute on chronic combined systolic (congestive) and diastolic (congestive) heart failure: Secondary | ICD-10-CM | POA: Diagnosis present

## 2022-01-23 DIAGNOSIS — I5A Non-ischemic myocardial injury (non-traumatic): Secondary | ICD-10-CM | POA: Diagnosis present

## 2022-01-23 DIAGNOSIS — Z91012 Allergy to eggs: Secondary | ICD-10-CM

## 2022-01-23 DIAGNOSIS — Z6834 Body mass index (BMI) 34.0-34.9, adult: Secondary | ICD-10-CM

## 2022-01-23 DIAGNOSIS — I2489 Other forms of acute ischemic heart disease: Secondary | ICD-10-CM | POA: Diagnosis present

## 2022-01-23 DIAGNOSIS — M109 Gout, unspecified: Secondary | ICD-10-CM | POA: Diagnosis present

## 2022-01-23 DIAGNOSIS — Z8673 Personal history of transient ischemic attack (TIA), and cerebral infarction without residual deficits: Secondary | ICD-10-CM

## 2022-01-23 DIAGNOSIS — Z833 Family history of diabetes mellitus: Secondary | ICD-10-CM

## 2022-01-23 DIAGNOSIS — Z888 Allergy status to other drugs, medicaments and biological substances status: Secondary | ICD-10-CM

## 2022-01-23 LAB — CBC
HCT: 46.5 % (ref 39.0–52.0)
Hemoglobin: 14.4 g/dL (ref 13.0–17.0)
MCH: 25.8 pg — ABNORMAL LOW (ref 26.0–34.0)
MCHC: 31 g/dL (ref 30.0–36.0)
MCV: 83.2 fL (ref 80.0–100.0)
Platelets: 248 10*3/uL (ref 150–400)
RBC: 5.59 MIL/uL (ref 4.22–5.81)
RDW: 21 % — ABNORMAL HIGH (ref 11.5–15.5)
WBC: 8.2 10*3/uL (ref 4.0–10.5)
nRBC: 0 % (ref 0.0–0.2)

## 2022-01-23 LAB — COMPREHENSIVE METABOLIC PANEL
ALT: 14 U/L (ref 0–44)
AST: 17 U/L (ref 15–41)
Albumin: 3.5 g/dL (ref 3.5–5.0)
Alkaline Phosphatase: 78 U/L (ref 38–126)
Anion gap: 11 (ref 5–15)
BUN: 49 mg/dL — ABNORMAL HIGH (ref 6–20)
CO2: 23 mmol/L (ref 22–32)
Calcium: 9 mg/dL (ref 8.9–10.3)
Chloride: 103 mmol/L (ref 98–111)
Creatinine, Ser: 3.31 mg/dL — ABNORMAL HIGH (ref 0.61–1.24)
GFR, Estimated: 21 mL/min — ABNORMAL LOW (ref >60)
Glucose, Bld: 112 mg/dL — ABNORMAL HIGH (ref 70–99)
Potassium: 3.9 mmol/L (ref 3.5–5.1)
Sodium: 137 mmol/L (ref 135–145)
Total Bilirubin: 2.2 mg/dL — ABNORMAL HIGH (ref 0.3–1.2)
Total Protein: 7.1 g/dL (ref 6.5–8.1)

## 2022-01-23 LAB — LIPASE, BLOOD: Lipase: 26 U/L (ref 11–51)

## 2022-01-23 NOTE — ED Provider Triage Note (Signed)
Emergency Medicine Provider Triage Evaluation Note  Hector [name] , a 56 y.o. male  was evaluated in triage.  Pt complains of "fluid on his abdomen."  Patient with a history of alcoholic cardiomyopathy, alcohol abuse, hypertension, CKD, pancreatitis presents to the ED via EMS from home.  She reports he has fluid drained off of his abdomen on average once a month, and reports he is due for that.  Review of Systems  Positive: ascites Negative: FCS  Physical Exam  BP (!) 144/89 (BP Location: Right Arm)   Pulse 71   Temp 98 F (36.7 C) (Oral)   Resp 18   SpO2 96%  Gen:   Awake, no distress  NAD Resp:  Normal effort CTA MSK:   Moves extremities without difficulty  ABD:  Distended, nontender  Medical Decision Making  Medically screening exam initiated at 5:17 PM.  Appropriate orders placed.  ARLEIGH ODOWD was informed that the remainder of the evaluation will be completed by another provider, this initial triage assessment does not replace that evaluation, and the importance of remaining in the ED until their evaluation is complete.  Patient to the ED via EMS from home, with complaints of fluid retention on the abdomen.  He is requesting paracentesis.   Melvenia Needles, PA-C 01/23/22 1729

## 2022-01-23 NOTE — ED Triage Notes (Signed)
Pt comes via EMs from home with c.op belly pain for 3 days. Pt states he usually has to get fluid drained off once a month and it is due for that.  Pt has hx of CHF  vss

## 2022-01-23 NOTE — ED Provider Notes (Signed)
[facility] Provider Note    Event Date/Time   First MD Initiated Contact with Patient 01/23/22 2330     (approximate)   History   Abdominal Pain   HPI {Remember to add pertinent medical, surgical, social, and/or OB history to HPI:1} Hector [name] is a 56 y.o. male  ***with history of CHF, atrial fibrillation, diabetes, obesity, and hypothyroidism  I reviewed the past medical records.  The patient was admitted last month.  Per the hospitalist discharge summary for 11/20 he presented with increased shortness of breath and swelling and was admitted for acute on chronic CHF and diuresis.   Physical Exam   Triage Vital Signs: ED Triage Vitals  Enc Vitals Group     BP 01/23/22 1653 (!) 144/89     Pulse Rate 01/23/22 1653 71     Resp 01/23/22 1653 18     Temp 01/23/22 1653 98 F (36.7 C)     Temp Source 01/23/22 1653 Oral     SpO2 01/23/22 1653 96 %     Weight --      Height --      Head Circumference --      Peak Flow --      Pain Score 01/23/22 1638 7     Pain Loc --      Pain Edu? --      Excl. in [place]? --     Most recent vital signs: Vitals:   01/23/22 1653 01/23/22 2149  BP: (!) 144/89 (!) 123/103  Pulse: 71 (!) 112  Resp: 18 18  Temp: 98 F (36.7 C) 97.8 F (36.6 C)  SpO2: 96% 92%    {Only need to document appropriate and relevant physical exam:1} General: Awake, no distress. *** CV:  Good peripheral perfusion. *** Resp:  Normal effort. *** Abd:  No distention. *** Other:  ***   ED Results / Procedures / Treatments   Labs (all labs ordered are listed, but only abnormal results are displayed) Labs Reviewed  COMPREHENSIVE METABOLIC PANEL - Abnormal; Notable for the following components:      Result Value   Glucose, Bld 112 (*)    BUN 49 (*)    Creatinine, Ser 3.31 (*)    Total Bilirubin 2.2 (*)    GFR, Estimated 21 (*)    All other components within normal limits  CBC - Abnormal; Notable for the following  components:   MCH 25.8 (*)    RDW 21.0 (*)    All other components within normal limits  LIPASE, BLOOD  URINALYSIS, ROUTINE W REFLEX MICROSCOPIC  BRAIN NATRIURETIC PEPTIDE     EKG  ***   RADIOLOGY *** {USE THE WORD "INTERPRETED"!! You MUST document your own interpretation of imaging, as well as the fact that you reviewed the radiologist's report!:1}   PROCEDURES:  Critical Care performed: {CriticalCareYesNo:19197::"Yes, see critical care procedure note(s)","No"}  Procedures   MEDICATIONS ORDERED IN ED: Medications - No data to display   IMPRESSION / MDM / [place] / ED COURSE  I reviewed the triage vital signs and the nursing notes.                              Differential diagnosis includes, but is not limited to, ***  Patient's presentation is most consistent with {EM COPA:27473}  *** {If the patient is on the monitor, remove the brackets and asterisks on the sentence below and  remember to document it as a Procedure as well. Otherwise delete the sentence below:1} {**The patient is on the cardiac monitor to evaluate for evidence of arrhythmia and/or significant heart rate changes.**} {Remember to include, when applicable, any/all of the following data: independent review of imaging independent review of labs (comment specifically on pertinent positives and negatives) review of specific prior hospitalizations, PCP/specialist notes, etc. discuss meds given and prescribed document any discussion with consultants (including hospitalists) any clinical decision tools you used and why (PECARN, NEXUS, etc.) did you consider admitting the patient? document social determinants of health affecting patient's care (homelessness, inability to follow up in a timely fashion, etc) document any pre-existing conditions increasing risk on current visit (e.g. diabetes and HTN increasing danger of high-risk chest pain/ACS) describes what meds you gave (especially  parenteral) and why any other interventions?:1}     FINAL CLINICAL IMPRESSION(S) / ED DIAGNOSES   Final diagnoses:  None     Rx / DC Orders   ED Discharge Orders     None        Note:  This document was prepared using Dragon voice recognition software and may include unintentional dictation errors.

## 2022-01-24 DIAGNOSIS — N390 Urinary tract infection, site not specified: Secondary | ICD-10-CM | POA: Diagnosis not present

## 2022-01-24 DIAGNOSIS — N184 Chronic kidney disease, stage 4 (severe): Secondary | ICD-10-CM | POA: Diagnosis present

## 2022-01-24 DIAGNOSIS — I5A Non-ischemic myocardial injury (non-traumatic): Secondary | ICD-10-CM | POA: Diagnosis present

## 2022-01-24 DIAGNOSIS — I130 Hypertensive heart and chronic kidney disease with heart failure and stage 1 through stage 4 chronic kidney disease, or unspecified chronic kidney disease: Secondary | ICD-10-CM | POA: Diagnosis present

## 2022-01-24 DIAGNOSIS — I5043 Acute on chronic combined systolic (congestive) and diastolic (congestive) heart failure: Secondary | ICD-10-CM | POA: Diagnosis present

## 2022-01-24 DIAGNOSIS — Z91012 Allergy to eggs: Secondary | ICD-10-CM | POA: Diagnosis not present

## 2022-01-24 DIAGNOSIS — Z833 Family history of diabetes mellitus: Secondary | ICD-10-CM | POA: Diagnosis not present

## 2022-01-24 DIAGNOSIS — Z8249 Family history of ischemic heart disease and other diseases of the circulatory system: Secondary | ICD-10-CM | POA: Diagnosis not present

## 2022-01-24 DIAGNOSIS — M109 Gout, unspecified: Secondary | ICD-10-CM | POA: Diagnosis present

## 2022-01-24 DIAGNOSIS — Z888 Allergy status to other drugs, medicaments and biological substances status: Secondary | ICD-10-CM | POA: Diagnosis not present

## 2022-01-24 DIAGNOSIS — E039 Hypothyroidism, unspecified: Secondary | ICD-10-CM | POA: Diagnosis present

## 2022-01-24 DIAGNOSIS — I480 Paroxysmal atrial fibrillation: Secondary | ICD-10-CM

## 2022-01-24 DIAGNOSIS — B952 Enterococcus as the cause of diseases classified elsewhere: Secondary | ICD-10-CM | POA: Diagnosis present

## 2022-01-24 DIAGNOSIS — I426 Alcoholic cardiomyopathy: Secondary | ICD-10-CM | POA: Diagnosis present

## 2022-01-24 DIAGNOSIS — E785 Hyperlipidemia, unspecified: Secondary | ICD-10-CM | POA: Diagnosis present

## 2022-01-24 DIAGNOSIS — E669 Obesity, unspecified: Secondary | ICD-10-CM

## 2022-01-24 DIAGNOSIS — Z6834 Body mass index (BMI) 34.0-34.9, adult: Secondary | ICD-10-CM | POA: Diagnosis not present

## 2022-01-24 DIAGNOSIS — Z87891 Personal history of nicotine dependence: Secondary | ICD-10-CM | POA: Diagnosis not present

## 2022-01-24 DIAGNOSIS — I10 Essential (primary) hypertension: Secondary | ICD-10-CM

## 2022-01-24 DIAGNOSIS — I5023 Acute on chronic systolic (congestive) heart failure: Secondary | ICD-10-CM | POA: Diagnosis not present

## 2022-01-24 DIAGNOSIS — E1122 Type 2 diabetes mellitus with diabetic chronic kidney disease: Secondary | ICD-10-CM | POA: Diagnosis present

## 2022-01-24 DIAGNOSIS — Z8673 Personal history of transient ischemic attack (TIA), and cerebral infarction without residual deficits: Secondary | ICD-10-CM | POA: Diagnosis not present

## 2022-01-24 DIAGNOSIS — D509 Iron deficiency anemia, unspecified: Secondary | ICD-10-CM | POA: Diagnosis present

## 2022-01-24 DIAGNOSIS — Z7901 Long term (current) use of anticoagulants: Secondary | ICD-10-CM | POA: Diagnosis not present

## 2022-01-24 DIAGNOSIS — I2489 Other forms of acute ischemic heart disease: Secondary | ICD-10-CM | POA: Diagnosis present

## 2022-01-24 DIAGNOSIS — F1011 Alcohol abuse, in remission: Secondary | ICD-10-CM | POA: Diagnosis present

## 2022-01-24 LAB — URINALYSIS, ROUTINE W REFLEX MICROSCOPIC
Bilirubin Urine: NEGATIVE
Glucose, UA: 150 mg/dL — AB
Ketones, ur: NEGATIVE mg/dL
Nitrite: NEGATIVE
Protein, ur: >=300 mg/dL — AB
Specific Gravity, Urine: 1.01 (ref 1.005–1.030)
WBC, UA: >50 WBC/hpf — ABNORMAL HIGH (ref 0–5)
pH: 6 (ref 5.0–8.0)

## 2022-01-24 LAB — CBC
HCT: 48 % (ref 39.0–52.0)
Hemoglobin: 15 g/dL (ref 13.0–17.0)
MCH: 26 pg (ref 26.0–34.0)
MCHC: 31.3 g/dL (ref 30.0–36.0)
MCV: 83.2 fL (ref 80.0–100.0)
Platelets: 239 10*3/uL (ref 150–400)
RBC: 5.77 MIL/uL (ref 4.22–5.81)
RDW: 21 % — ABNORMAL HIGH (ref 11.5–15.5)
WBC: 8.7 10*3/uL (ref 4.0–10.5)
nRBC: 0 % (ref 0.0–0.2)

## 2022-01-24 LAB — BASIC METABOLIC PANEL
Anion gap: 10 (ref 5–15)
BUN: 50 mg/dL — ABNORMAL HIGH (ref 6–20)
CO2: 23 mmol/L (ref 22–32)
Calcium: 8.8 mg/dL — ABNORMAL LOW (ref 8.9–10.3)
Chloride: 103 mmol/L (ref 98–111)
Creatinine, Ser: 3.04 mg/dL — ABNORMAL HIGH (ref 0.61–1.24)
GFR, Estimated: 23 mL/min — ABNORMAL LOW (ref >60)
Glucose, Bld: 107 mg/dL — ABNORMAL HIGH (ref 70–99)
Potassium: 4 mmol/L (ref 3.5–5.1)
Sodium: 136 mmol/L (ref 135–145)

## 2022-01-24 LAB — TROPONIN I (HIGH SENSITIVITY)
Troponin I (High Sensitivity): 69 ng/L — ABNORMAL HIGH (ref <18)
Troponin I (High Sensitivity): 81 ng/L — ABNORMAL HIGH (ref <18)

## 2022-01-24 LAB — BRAIN NATRIURETIC PEPTIDE
B Natriuretic Peptide: 1891.1 pg/mL — ABNORMAL HIGH (ref 0.0–100.0)
B Natriuretic Peptide: 2291.1 pg/mL — ABNORMAL HIGH (ref 0.0–100.0)

## 2022-01-24 MED ORDER — ALLOPURINOL 100 MG PO TABS
50.0000 mg | ORAL_TABLET | Freq: Every day | ORAL | Status: DC
  Administered 2022-01-24 – 2022-01-27 (×4): 50 mg via ORAL
  Filled 2022-01-24: qty 0.5
  Filled 2022-01-24 (×2): qty 1
  Filled 2022-01-24: qty 0.5

## 2022-01-24 MED ORDER — FERROUS GLUCONATE 324 (38 FE) MG PO TABS
324.0000 mg | ORAL_TABLET | Freq: Every day | ORAL | Status: DC
  Administered 2022-01-24 – 2022-01-27 (×4): 324 mg via ORAL
  Filled 2022-01-24 (×4): qty 1

## 2022-01-24 MED ORDER — ALBUTEROL SULFATE (2.5 MG/3ML) 0.083% IN NEBU: 2.5000 mg | INHALATION_SOLUTION | RESPIRATORY_TRACT | Status: DC | PRN

## 2022-01-24 MED ORDER — LEVOTHYROXINE SODIUM 100 MCG PO TABS
100.0000 ug | ORAL_TABLET | Freq: Every day | ORAL | Status: DC
  Administered 2022-01-24 – 2022-01-27 (×4): 100 ug via ORAL
  Filled 2022-01-24 (×4): qty 1

## 2022-01-24 MED ORDER — ACETAMINOPHEN 325 MG PO TABS: 650.0000 mg | ORAL_TABLET | Freq: Four times a day (QID) | ORAL | Status: DC | PRN

## 2022-01-24 MED ORDER — DAPAGLIFLOZIN PROPANEDIOL 10 MG PO TABS
10.0000 mg | ORAL_TABLET | Freq: Every day | ORAL | Status: DC
  Administered 2022-01-24 – 2022-01-27 (×4): 10 mg via ORAL
  Filled 2022-01-24 (×4): qty 1

## 2022-01-24 MED ORDER — ONDANSETRON HCL 4 MG PO TABS: 4.0000 mg | ORAL_TABLET | Freq: Four times a day (QID) | ORAL | Status: DC | PRN

## 2022-01-24 MED ORDER — HYDRALAZINE HCL 20 MG/ML IJ SOLN: 5.0000 mg | INTRAMUSCULAR | Status: DC | PRN

## 2022-01-24 MED ORDER — MAGNESIUM HYDROXIDE 400 MG/5ML PO SUSP: 30.0000 mL | Freq: Every day | ORAL | Status: DC | PRN

## 2022-01-24 MED ORDER — SODIUM CHLORIDE 0.9 % IV SOLN
1.0000 g | Freq: Once | INTRAVENOUS | Status: AC
  Administered 2022-01-24: 1 g via INTRAVENOUS
  Filled 2022-01-24: qty 10

## 2022-01-24 MED ORDER — ONDANSETRON HCL 4 MG/2ML IJ SOLN: 4.0000 mg | Freq: Four times a day (QID) | INTRAMUSCULAR | Status: DC | PRN

## 2022-01-24 MED ORDER — INSULIN ASPART 100 UNIT/ML IJ SOLN: 0.0000 [IU] | Freq: Every day | INTRAMUSCULAR | Status: DC

## 2022-01-24 MED ORDER — TRAZODONE HCL 50 MG PO TABS: 25.0000 mg | ORAL_TABLET | Freq: Every evening | ORAL | Status: DC | PRN

## 2022-01-24 MED ORDER — ACETAMINOPHEN 650 MG RE SUPP: 650.0000 mg | Freq: Four times a day (QID) | RECTAL | Status: DC | PRN

## 2022-01-24 MED ORDER — SODIUM CHLORIDE 0.9 % IV SOLN
2.0000 g | INTRAVENOUS | Status: DC
  Administered 2022-01-25 – 2022-01-26 (×2): 2 g via INTRAVENOUS
  Filled 2022-01-24: qty 20
  Filled 2022-01-24: qty 2

## 2022-01-24 MED ORDER — POTASSIUM CHLORIDE CRYS ER 20 MEQ PO TBCR: 20.0000 meq | EXTENDED_RELEASE_TABLET | Freq: Every day | ORAL | Status: DC

## 2022-01-24 MED ORDER — ATORVASTATIN CALCIUM 80 MG PO TABS
80.0000 mg | ORAL_TABLET | Freq: Every day | ORAL | Status: DC
  Administered 2022-01-24 – 2022-01-27 (×4): 80 mg via ORAL
  Filled 2022-01-24 (×2): qty 1
  Filled 2022-01-24 (×2): qty 4

## 2022-01-24 MED ORDER — INSULIN ASPART 100 UNIT/ML IJ SOLN: 0.0000 [IU] | Freq: Three times a day (TID) | INTRAMUSCULAR | Status: DC

## 2022-01-24 MED ORDER — FUROSEMIDE 10 MG/ML IJ SOLN
80.0000 mg | Freq: Once | INTRAMUSCULAR | Status: AC
  Administered 2022-01-24: 80 mg via INTRAVENOUS
  Filled 2022-01-24: qty 8

## 2022-01-24 MED ORDER — DM-GUAIFENESIN ER 30-600 MG PO TB12: 1.0000 | ORAL_TABLET | Freq: Two times a day (BID) | ORAL | Status: DC | PRN

## 2022-01-24 MED ORDER — APIXABAN 5 MG PO TABS
5.0000 mg | ORAL_TABLET | Freq: Two times a day (BID) | ORAL | Status: DC
  Administered 2022-01-24 – 2022-01-27 (×8): 5 mg via ORAL
  Filled 2022-01-24 (×8): qty 1

## 2022-01-24 MED ORDER — SODIUM CHLORIDE 0.9 % IV SOLN: 1.0000 g | INTRAVENOUS | Status: DC

## 2022-01-24 MED ORDER — CARVEDILOL 6.25 MG PO TABS
6.2500 mg | ORAL_TABLET | Freq: Two times a day (BID) | ORAL | Status: DC
  Administered 2022-01-24 – 2022-01-27 (×6): 6.25 mg via ORAL
  Filled 2022-01-24 (×6): qty 1

## 2022-01-24 MED ORDER — FUROSEMIDE 10 MG/ML IJ SOLN
80.0000 mg | Freq: Two times a day (BID) | INTRAMUSCULAR | Status: DC
  Administered 2022-01-24 – 2022-01-26 (×4): 80 mg via INTRAVENOUS
  Filled 2022-01-24 (×4): qty 8

## 2022-01-24 NOTE — H&P (Signed)
History and Physical    [name]:[id number] DOB: August 07, 1965 DOA: 01/23/2022  Referring MD/NP/PA:   PCP: [name], MD   Patient coming from:  The patient is coming from home.  At baseline, pt is independent for most of ADL.        Chief Complaint: Shortness of breath  HPI: Hector [name] is a 56 y.o. 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  Patient states that he has shortness of breath for more than 3 days, which has been progressively worsening.  Patient also has worsening abdominal swelling, distention and abdominal pain, leg edema.  Patient has chest pressure and discomfort.  No cough, fever or chills.  Denies nausea vomiting or diarrhea.  Patient has increased urinary frequency, denies dysuria or burning on urination.  Data reviewed independently and ED Course: pt was found to have BNP 2291, troponin level 81, 69, stable renal function, positive urinalysis (hazy appearance, large amount of leukocyte, rare bacteria, WBC> 50), temperature normal, blood pressure 129/113, heart rate 112, 61, RR 20, oxygen saturation 94% on room air.  Chest x-ray showed pulmonary edema.  Patient is admitted to telemetry bed as inpatient.       EKG: I have personally reviewed.  Atrial fibrillation, QTc 449, LAD, poor R wave progression.   Review of Systems:   General: no fevers, chills, no body weight gain, has fatigue HEENT: no blurry vision, hearing changes or sore throat Respiratory: has dyspnea, no coughing, wheezing CV: has chest pressure and discomfort, no palpitations GI: no nausea, vomiting, diarrhea, constipation.  Has abdominal swelling and abdominal pain GU: no dysuria, burning on urination, has increased urinary frequency,  no hematuria  Ext: has leg edema Neuro: no unilateral weakness, numbness, or tingling, no vision change or hearing loss Skin: no rash, no skin tear. MSK: No muscle spasm, no deformity, no limitation of range of movement in spin Heme: No easy bruising.  Travel history: No recent long distant travel.   Allergy:  Allergies  Allergen Reactions   Esomeprazole Magnesium Other (See Comments)    Suspected interstitial nephritis 2018   Eggs Or Egg-Derived Products Rash    Past Medical History:  Diagnosis Date   Alcohol abuse    Alcoholic cardiomyopathy ([place])    a. 12/2007 MV: EF 28%, no isch;  b. 8/12 Echo: EF 25-35%; c. 02/2014 Echo: EF 20-25%; d. 12/2014 Cath: minimal CAD; e. 01/2015 Echo: EF 50-55%;  d. 05/2016 Echo: EF 30-35%, diff HK, gr2 DD; e. 11/2016 Echo: EF 45-50%, diff HK; f. 09/2021 Echo: EF <20%; g. 11/2021 Echo: EF<20%.   Chronic combined systolic (congestive) and diastolic (congestive) heart failure ([place])    a. 05/2016 Echo: EF 30-35%; b. 11/2016 Echo: EF 45-50%, diff HK; c. 09/2021 Echo: EF < 20%; d. 11/2021 Echo: EF <20%, no rwma, Nl RV fxn, sev dil LA, mod-sev MR. mod dil PA w/ mildly elev PASP.   CKD (chronic kidney disease), stage III (HCC)    Elevated troponin (chronic)    Essential hypertension    GI bleed 11/2013   Hyperlipidemia    Pancreatitis    Paroxysmal A-fib (HCC)    a. new onset s/p unsuccessful TEE/DCCV on 08/16/2014; b. CHA2DS2VASc = 3-> eliquis (freq noncompliant); c. 02/2021 s/p TEE/DCCV; d. Prev on amio->d/c 2/2 abnl TFTs.  Paroxysmal atrial flutter (HCC)    Sleep-disordered breathing    Has yet to have a sleep study   Stroke [facility])     Past Surgical History:  Procedure Laterality Date   CARDIAC CATHETERIZATION N/A 01/09/2015   Procedure: Left Heart Cath and Coronary Angiography;  Surgeon: [name], MD;  Location: [facility];  Service: Cardiovascular;  Laterality: N/A;   CARDIOVERSION N/A 03/05/2021   Procedure: CARDIOVERSION;  Surgeon:  Kate Sable, MD;  Location: [facility] ORS;  Service: Cardiovascular;  Laterality: N/A;   COLONOSCOPY N/A 07/22/2020   Procedure: COLONOSCOPY;  Surgeon: Toledo, Benay Pike, MD;  Location: [facility] ENDOSCOPY;  Service: Gastroenterology;  Laterality: N/A;   ELECTROPHYSIOLOGIC STUDY N/A 08/16/2014   Procedure: CARDIOVERSION;  Surgeon: [name], MD;  Location: [facility] ORS;  Service: Cardiovascular;  Laterality: N/A;   FLEXIBLE SIGMOIDOSCOPY N/A 10/10/2015   Procedure: FLEXIBLE SIGMOIDOSCOPY;  Surgeon: [name], MD;  Location: [facility] ENDOSCOPY;  Service: Endoscopy;  Laterality: N/A;   KNEE SURGERY Right    NM MYOVIEW LTD  November 2011   No ischemia or infarction. EF 50-55% ( no improvement from 2009 Myoview EF of 28%   TEE WITHOUT CARDIOVERSION N/A 08/16/2014   Procedure: TRANSESOPHAGEAL ECHOCARDIOGRAM (TEE);  Surgeon: [name], MD;  Location: [facility] ORS;  Service: Cardiovascular;  Laterality: N/A;   TEE WITHOUT CARDIOVERSION N/A 03/05/2021   Procedure: TRANSESOPHAGEAL ECHOCARDIOGRAM (TEE);  Surgeon: Kate Sable, MD;  Location: [facility] ORS;  Service: Cardiovascular;  Laterality: N/A;    Social History:  reports that he quit smoking about 23 years ago. His smoking use included cigarettes. He has a 12.00 pack-year smoking history. He has never used smokeless tobacco. He reports that he does not currently use alcohol. He reports that he does not use drugs.  Family History:  Family History  Problem Relation Age of Onset   Hypertension Mother    Hyperlipidemia Mother    Diabetes Mother      Prior to Admission medications   Medication Sig Start Date End Date Taking? Authorizing Provider  allopurinol (ZYLOPRIM) 100 MG tablet Take 0.5 tablets (50 mg total) by mouth daily. 10/03/21  Yes Danford, Suann Larry, MD  apixaban (ELIQUIS) 5 MG TABS tablet Take 1 tablet (5 mg total) by mouth 2 (two) times daily. 10/02/21  Yes Danford, Suann Larry, MD  atorvastatin (LIPITOR) 80 MG tablet Take 1  tablet (80 mg total) by mouth daily. 11/24/21  Yes Annita Brod, MD  carvedilol (COREG) 6.25 MG tablet Take 1 tablet (6.25 mg total) by mouth 2 (two) times daily with a meal. 10/02/21  Yes Danford, Suann Larry, MD  dapagliflozin propanediol (FARXIGA) 10 MG TABS tablet Take 1 tablet (10 mg total) by mouth daily before breakfast. 10/02/21  Yes Danford, Suann Larry, MD  levothyroxine (SYNTHROID) 100 MCG tablet Take 1 tablet (100 mcg total) by mouth daily before breakfast. 11/24/21  Yes Annita Brod, MD  potassium chloride SA (KLOR-CON M) 20 MEQ tablet Take 1 tablet (20 mEq total) by mouth daily. 11/24/21  Yes Annita Brod, MD  torsemide (DEMADEX) 20 MG tablet Take 3 tablets (60 mg total) by mouth 2 (two) times daily. 01/12/22  Yes Lorella Nimrod, MD  ferrous gluconate (FERGON) 324 MG tablet Take 324 mg by mouth daily with breakfast. 12/22/21   [provider]    Physical Exam: Vitals:   01/24/22 0209 01/24/22 0511 01/24/22 0839 01/24/22 1324  BP:  (!) 113/101 (!) 129/113 105/80  Pulse:  99 61 90  Resp:  20 18 16   Temp: 98.2 F (36.8 C)  98.7 F (37.1 C) 98.5 F (36.9 C)  TempSrc: Oral  Oral Oral  SpO2:  94% 96% 97%   General: Not in acute distress HEENT:       Eyes: PERRL, EOMI, no scleral icterus.       ENT: No discharge from the ears and nose, no pharynx injection, no tonsillar enlargement.        Neck: positive JVD, no bruit, no mass felt. Heme: No neck lymph node enlargement. Cardiac: S1/S2, irregularly irregular rhythm, No murmurs, No gallops or rubs. Respiratory: Has fine crackles bilaterally GI: Soft, has abdominal swelling, no rebound pain, no organomegaly, BS present. GU: No hematuria Ext: has 1+pitting leg edema bilaterally. 1+DP/PT pulse bilaterally. Musculoskeletal: No joint deformities, No joint redness or warmth, no limitation of ROM in spin. Skin: No rashes.  Neuro: Alert, oriented X3, cranial nerves II-XII grossly intact, moves all extremities   Psych: Patient is not psychotic, no suicidal or hemocidal ideation.  Labs on Admission: I have personally reviewed following labs and imaging studies  CBC: Recent Labs  Lab 01/23/22 1655 01/24/22 0505  WBC 8.2 8.7  HGB 14.4 15.0  HCT 46.5 48.0  MCV 83.2 83.2  PLT 248 921   Basic Metabolic Panel: Recent Labs  Lab 01/23/22 1655 01/24/22 0505  NA 137 136  K 3.9 4.0  CL 103 103  CO2 23 23  GLUCOSE 112* 107*  BUN 49* 50*  CREATININE 3.31* 3.04*  CALCIUM 9.0 8.8*   GFR: Estimated Creatinine Clearance: 34.1 mL/min (A) (by C-G formula based on SCr of 3.04 mg/dL (H)). Liver Function Tests: Recent Labs  Lab 01/23/22 1655  AST 17  ALT 14  ALKPHOS 78  BILITOT 2.2*  PROT 7.1  ALBUMIN 3.5   Recent Labs  Lab 01/23/22 1655  LIPASE 26   No results for input(s): "AMMONIA" in the last 168 hours. Coagulation Profile: No results for input(s): "INR", "PROTIME" in the last 168 hours. Cardiac Enzymes: No results for input(s): "CKTOTAL", "CKMB", "CKMBINDEX", "TROPONINI" in the last 168 hours. BNP (last 3 results) No results for input(s): "PROBNP" in the last 8760 hours. HbA1C: No results for input(s): "HGBA1C" in the last 72 hours. CBG: No results for input(s): "GLUCAP" in the last 168 hours. Lipid Profile: No results for input(s): "CHOL", "HDL", "LDLCALC", "TRIG", "CHOLHDL", "LDLDIRECT" in the last 72 hours. Thyroid Function Tests: No results for input(s): "TSH", "T4TOTAL", "FREET4", "T3FREE", "THYROIDAB" in the last 72 hours. Anemia Panel: No results for input(s): "VITAMINB12", "FOLATE", "FERRITIN", "TIBC", "IRON", "RETICCTPCT" in the last 72 hours. Urine analysis:    Component Value Date/Time   COLORURINE YELLOW (A) 01/24/2022 0115   APPEARANCEUR HAZY (A) 01/24/2022 0115   APPEARANCEUR Clear 02/26/2014 1126   LABSPEC 1.010 01/24/2022 0115   LABSPEC 1.008 02/26/2014 1126   PHURINE 6.0 01/24/2022 0115   GLUCOSEU 150 (A) 01/24/2022 0115   GLUCOSEU Negative  02/26/2014 1126   HGBUR SMALL (A) 01/24/2022 0115   BILIRUBINUR NEGATIVE 01/24/2022 0115   BILIRUBINUR Negative 02/26/2014 1126   KETONESUR NEGATIVE 01/24/2022 0115   PROTEINUR >=300 (A) 01/24/2022 0115   NITRITE NEGATIVE 01/24/2022 0115   LEUKOCYTESUR LARGE (A) 01/24/2022 0115   LEUKOCYTESUR 1+ 02/26/2014 1126   Sepsis Labs: @LABRCNTIP (procalcitonin:4,lacticidven:4) )No results found for this or any previous visit (from the past 240 hour(s)).   Radiological Exams on Admission: DG Chest 2 View  Result Date: 01/24/2022 CLINICAL DATA:  Fluid  overload EXAM: CHEST two views COMPARISON:  01/06/2022. FINDINGS: Cardiac silhouette is prominent. There is pulmonary interstitial prominence with vascular congestion. No focal consolidation. No pneumothorax or pleural effusion identified. IMPRESSION: Findings suggest CHF. Electronically Signed   By: Sammie Bench M.D.   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  Patient has 1+ leg edema, elevated BNP 2291, chest x-ray showed pulm edema, clinically consistent with CHF exacerbation.  -Will admit to progressive unit as inpatient -Lasix 80 mg bid by IV -Continue Farxiga -Daily weights -strict I/O's -Low salt diet -Fluid restriction -Obtain REDs Vest reading   Myocardial injury: Troponin level 81> 69. -Continue Lipitor -Will not restart aspirin since patient is on Eliquis  Chronic renal  impairment, stage 4 (severe) ([place]): Stable.  Baseline creatinine 3.25 on 01/12/2022.  His creatinine is 3.04, BUN 50, GFR 23. 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  Patient is taking [place] -Sliding scale insulin -Continue Farxiga  History of Stroke [facility]) -Patient is on Lipitor and Eliquis  Iron deficiency anemia: Hemoglobin 15. -Continue iron supplement  Gout -Allopurinol  HLD (hyperlipidemia) -Lipitor  UTI (urinary tract infection) -Rocephin -Follow-up urine culture  Obesity (BMI 30-39.9): Body weight 111.9 kg, BMI 34.42 -Healthy diet and exercise -Encourage losing weight    DVT ppx: on Eliquis  Code Status: Full code  Family Communication: I offered to call his family, but the patient states that his brother knows what is going on for him, I do need to call his family per patient.  Disposition Plan:  Anticipate discharge back to previous environment  Consults called:  none  Admission status and Level of care: Progressive:   as inpt     Dispo: The patient is from: Home              Anticipated d/c is to: Home              Anticipated d/c date is: 2 days              Patient currently is not medically stable to d/c.    Severity of Illness:  The appropriate patient status for this patient is INPATIENT. Inpatient status is judged to be reasonable and necessary in order to provide the required intensity of service to ensure the patient's safety. The patient's presenting symptoms, physical exam findings, and initial radiographic and laboratory data in the context of their chronic comorbidities is felt to place them at high risk for further clinical deterioration. Furthermore, it is not anticipated that the  patient will be medically stable for discharge from the hospital within 2 midnights of admission.   * I certify that at the point of admission it is  my clinical judgment that the patient will require inpatient hospital care spanning beyond 2 midnights from the point of admission due to high intensity of service, high risk for further deterioration and high frequency of surveillance required.*       Date of Service 01/24/2022    Ivor Costa [facility]   If 7PM-7AM, please contact night-coverage [email or web address] 01/24/2022, 2:09 PM

## 2022-01-25 DIAGNOSIS — I5023 Acute on chronic systolic (congestive) heart failure: Secondary | ICD-10-CM | POA: Diagnosis not present

## 2022-01-25 DIAGNOSIS — N184 Chronic kidney disease, stage 4 (severe): Secondary | ICD-10-CM | POA: Diagnosis not present

## 2022-01-25 LAB — BASIC METABOLIC PANEL
Anion gap: 12 (ref 5–15)
BUN: 50 mg/dL — ABNORMAL HIGH (ref 6–20)
CO2: 20 mmol/L — ABNORMAL LOW (ref 22–32)
Calcium: 8.5 mg/dL — ABNORMAL LOW (ref 8.9–10.3)
Chloride: 103 mmol/L (ref 98–111)
Creatinine, Ser: 3.17 mg/dL — ABNORMAL HIGH (ref 0.61–1.24)
GFR, Estimated: 22 mL/min — ABNORMAL LOW (ref >60)
Glucose, Bld: 92 mg/dL (ref 70–99)
Potassium: 3.7 mmol/L (ref 3.5–5.1)
Sodium: 135 mmol/L (ref 135–145)

## 2022-01-25 LAB — MAGNESIUM: Magnesium: 2.3 mg/dL (ref 1.7–2.4)

## 2022-01-25 NOTE — Progress Notes (Signed)
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 Has 1+ leg edema, elevated BNP 2291, chest x-ray showed pulm edema, clinically consistent with CHF exacerbation. Continue on IV lasix. Monitor I/Os & daily weights. Continue on farxiga. Pt has not been monitoring fluid or salt intake. Needs CHF education    Myocardial injury: w/ minimally elevated troponins. Likely due to demand ischemia    CKDIV: Baseline Cr 3.25. Cr is trending down from day prior     HTN: continue on coreg. IV hydralazine prn    PAF: continue on coreg, eliquis    Hypothyroidism: continue on home dose of synthroid    DM2: well controlled, HbA1c 6.8. Continue on SSI w/ accuchecks & farxiga   Hx of CVA: continue on statin, eliquis  Iron deficiency anemia: continue on iron supplement   Gout: continue on allopurinol    HLD: continue on statin    UTI: urine cx is pending. Continue on IV rocephin    Obesity: BMI 34.4. Would benefit from weight loss       DVT prophylaxis: eliquis Code Status: full Family Communication:  Disposition Plan: likely d/c back home  Level of care: Progressive  Status is: Inpatient Remains inpatient appropriate because: severity of illness, requiring IV lasix     Consultants:    Procedures:   Antimicrobials:    Subjective: Pt c/o shortness of breath but improved from day  prior   Objective: Vitals:   01/24/22 1324 01/24/22 1952 01/25/22 0325 01/25/22 0640  BP: 105/80 (!) 120/100 (!) 110/98 (!) 128/99  Pulse: 90 91 (!) 56 98  Resp: 16 16 20  (!) 21  Temp: 98.5 F (36.9 C) 98.7 F (37.1 C) 97.8 F (36.6 C) 97.9 F (36.6 C)  TempSrc: Oral Oral Oral Oral  SpO2: 97% 98% 99% 97%  Weight:  111.9 kg    Height:  5\' 11"  (1.803 m)      Intake/Output Summary (Last 24 hours) at 01/25/2022 0821 Last data filed at 01/25/2022 0357 Gross per 24 hour  Intake 100 ml  Output 1000 ml  Net -900 ml   Filed Weights   01/24/22 1952  Weight: 111.9 kg    Examination:  General exam: Appears calm and comfortable  Respiratory system: diminished breath sounds b/l  Cardiovascular system: S1 & S2+. No rubs, gallops or clicks.  Gastrointestinal system: Abdomen is obese, soft and nontender. Normal bowel sounds heard. Central nervous system: Alert and oriented. Moves all extremities  Psychiatry: Judgement and insight appear normal. Flat mood and affect    Data Reviewed: I have personally reviewed following labs and imaging studies  CBC: Recent Labs  Lab 01/23/22 1655 01/24/22 0505  WBC 8.2 8.7  HGB 14.4 15.0  HCT 46.5 48.0  MCV 83.2 83.2  PLT 248 330   Basic Metabolic Panel: Recent Labs  Lab 01/23/22 1655 01/24/22 0505 01/25/22  0401  NA 137 136 135  K 3.9 4.0 3.7  CL 103 103 103  CO2 23 23 20*  GLUCOSE 112* 107* 92  BUN 49* 50* 50*  CREATININE 3.31* 3.04* 3.17*  CALCIUM 9.0 8.8* 8.5*  MG  --   --  2.3   GFR: Estimated Creatinine Clearance: 32.7 mL/min (A) (by C-G formula based on SCr of 3.17 mg/dL (H)). Liver Function Tests: Recent Labs  Lab 01/23/22 1655  AST 17  ALT 14  ALKPHOS 78  BILITOT 2.2*  PROT 7.1  ALBUMIN 3.5   Recent Labs  Lab 01/23/22 1655  LIPASE 26   No results for input(s): "AMMONIA" in the last 168 hours. Coagulation Profile: No results for input(s): "INR", "PROTIME" in the last 168 hours. Cardiac Enzymes: No  results for input(s): "CKTOTAL", "CKMB", "CKMBINDEX", "TROPONINI" in the last 168 hours. BNP (last 3 results) No results for input(s): "PROBNP" in the last 8760 hours. HbA1C: No results for input(s): "HGBA1C" in the last 72 hours. CBG: No results for input(s): "GLUCAP" in the last 168 hours. Lipid Profile: No results for input(s): "CHOL", "HDL", "LDLCALC", "TRIG", "CHOLHDL", "LDLDIRECT" in the last 72 hours. Thyroid Function Tests: No results for input(s): "TSH", "T4TOTAL", "FREET4", "T3FREE", "THYROIDAB" in the last 72 hours. Anemia Panel: No results for input(s): "VITAMINB12", "FOLATE", "FERRITIN", "TIBC", "IRON", "RETICCTPCT" in the last 72 hours. Sepsis Labs: No results for input(s): "PROCALCITON", "LATICACIDVEN" in the last 168 hours.  No results found for this or any previous visit (from the past 240 hour(s)).       Radiology Studies: DG Chest 2 View  Result Date: 01/24/2022 CLINICAL DATA:  Fluid overload EXAM: CHEST two views COMPARISON:  01/06/2022. FINDINGS: Cardiac silhouette is prominent. There is pulmonary interstitial prominence with vascular congestion. No focal consolidation. No pneumothorax or pleural effusion identified. IMPRESSION: Findings suggest CHF. Electronically Signed   By: Sammie Bench M.D.   On: 01/24/2022 00:23        Scheduled Meds:  allopurinol  50 mg Oral Daily   apixaban  5 mg Oral BID   atorvastatin  80 mg Oral Daily   carvedilol  6.25 mg Oral BID WC   dapagliflozin propanediol  10 mg Oral QAC breakfast   ferrous gluconate  324 mg Oral Q breakfast   furosemide  80 mg Intravenous Q12H   insulin aspart  0-5 Units Subcutaneous QHS   insulin aspart  0-9 Units Subcutaneous TID WC   levothyroxine  100 mcg Oral Q0600   Continuous Infusions:  cefTRIAXone (ROCEPHIN)  IV Stopped (01/25/22 0357)     LOS: 1 day    Time spent: 35 mins     Wyvonnia Dusky, MD [facility] Pager [phone]  If 7PM-7AM, please contact  night-coverage [email or web address] 01/25/2022, 8:21 AM

## 2022-01-25 NOTE — ED Notes (Signed)
Advised nurse that patient has ready bed 

## 2022-01-26 ENCOUNTER — Encounter: Payer: Medicaid Other | Admitting: Internal Medicine

## 2022-01-26 DIAGNOSIS — N184 Chronic kidney disease, stage 4 (severe): Secondary | ICD-10-CM | POA: Diagnosis not present

## 2022-01-26 DIAGNOSIS — N390 Urinary tract infection, site not specified: Secondary | ICD-10-CM | POA: Diagnosis not present

## 2022-01-26 DIAGNOSIS — I5023 Acute on chronic systolic (congestive) heart failure: Secondary | ICD-10-CM | POA: Diagnosis not present

## 2022-01-26 LAB — BASIC METABOLIC PANEL
Anion gap: 9 (ref 5–15)
BUN: 53 mg/dL — ABNORMAL HIGH (ref 6–20)
CO2: 28 mmol/L (ref 22–32)
Calcium: 8.6 mg/dL — ABNORMAL LOW (ref 8.9–10.3)
Chloride: 100 mmol/L (ref 98–111)
Creatinine, Ser: 3.45 mg/dL — ABNORMAL HIGH (ref 0.61–1.24)
GFR, Estimated: 20 mL/min — ABNORMAL LOW (ref >60)
Glucose, Bld: 97 mg/dL (ref 70–99)
Potassium: 3.9 mmol/L (ref 3.5–5.1)
Sodium: 137 mmol/L (ref 135–145)

## 2022-01-26 LAB — URINE CULTURE: Culture: >=100000 — AB

## 2022-01-26 LAB — CBC
HCT: 46.3 % (ref 39.0–52.0)
Hemoglobin: 14.4 g/dL (ref 13.0–17.0)
MCH: 25.9 pg — ABNORMAL LOW (ref 26.0–34.0)
MCHC: 31.1 g/dL (ref 30.0–36.0)
MCV: 83.3 fL (ref 80.0–100.0)
Platelets: 229 10*3/uL (ref 150–400)
RBC: 5.56 MIL/uL (ref 4.22–5.81)
RDW: 20.5 % — ABNORMAL HIGH (ref 11.5–15.5)
WBC: 8 10*3/uL (ref 4.0–10.5)
nRBC: 0 % (ref 0.0–0.2)

## 2022-01-26 MED ORDER — FUROSEMIDE 10 MG/ML IJ SOLN
40.0000 mg | Freq: Two times a day (BID) | INTRAMUSCULAR | Status: DC
  Administered 2022-01-26 – 2022-01-27 (×3): 40 mg via INTRAVENOUS
  Filled 2022-01-26 (×3): qty 4

## 2022-01-26 MED ORDER — SODIUM CHLORIDE 0.9 % IV SOLN
1.0000 g | Freq: Three times a day (TID) | INTRAVENOUS | Status: DC
  Administered 2022-01-26 – 2022-01-27 (×4): 1 g via INTRAVENOUS
  Filled 2022-01-26 (×2): qty 1000
  Filled 2022-01-26: qty 1
  Filled 2022-01-26: qty 1000
  Filled 2022-01-26: qty 1
  Filled 2022-01-26 (×2): qty 1000

## 2022-01-26 NOTE — Progress Notes (Deleted)
Per chart review, new consult placed for SNF, however, there are  no recs in place for SNF.  TOC will continue to follow for any recs to SNF for patient discharge.  [place], [place]

## 2022-01-26 NOTE — Progress Notes (Signed)
   Heart Failure Nurse Navigator Note  Tempted to see patient 2 times today, he was asleep on his side and did not respond when name called.  Continue to follow.  Pricilla Riffle RN [facility]

## 2022-01-26 NOTE — Progress Notes (Signed)
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 Echo on 12/08/2021 showed EF<20%. Has 1+ leg edema, elevated BNP 2291, chest x-ray showed pulm edema, clinically consistent with CHF exacerbation. Continue on IV lasix. Monitor I/Os and daily weights. Neg approx 2.7 L today so far. Continue on farxiga. Received CHF education once again   UTI: urine cx is growing enterococcus faecalis. Abx changed to IV ampicillin    Myocardial injury: w/ minimally elevated troponins. Likely due to demand ischemia    CKDIV: Baseline Cr 3.25. Cr is trending up from day prior. Reduce dose of IV lasix today    HTN: continue on coreg. IV hydralazine prn    PAF: continue on eliquis, coreg    Hypothyroidism: continue on home dose of levothyroxine    DM2: HbA1c 6.8, fairly well controlled. Continue on SSI w/ accuchecks    Hx of CVA: continue on eliquis, statin   Iron deficiency anemia: not currently anemic   Gout: continue on allopurinol    HLD: continue on statin    Obesity: BMI 33.1. Would benefit from weight loss       DVT prophylaxis: eliquis Code Status: full Family Communication:  Disposition Plan: likely d/c back home  Level of care: Progressive  Status is: Inpatient Remains inpatient appropriate because: severity of illness, requiring IV lasix  & IV abxs    Consultants:    Procedures:   Antimicrobials:    Subjective: Pt c/o malaise   Objective: Vitals:   01/26/22 0010 01/26/22 0500 01/26/22 0826 01/26/22 1240  BP: 119/86  (!) 122/101 (!) 103/92  Pulse: 90  (!) 52 (!) 54  Resp: 16  20 18   Temp: 98.6 F (37 C)  97.8 F (36.6 C) 98.7 F (37.1 C)  TempSrc: Oral  Oral Oral  SpO2: 98%  96% 93%  Weight:  107.7 kg    Height:        Intake/Output Summary (Last 24 hours) at 01/26/2022 1554 Last data filed at 01/26/2022 1500 Gross per 24 hour  Intake 500 ml  Output 3250 ml  Net -2750 ml   Filed Weights   01/24/22 1952 01/26/22 0500  Weight: 111.9 kg 107.7 kg    Examination:  General exam: Appears comfortable  Respiratory system: decreased breath sounds b/l  Cardiovascular system: S1/S2+. No rubs or clicks Gastrointestinal system: Abd is soft, NT, obese & normal bowel sounds  Central nervous system: Alert and oriented. Moves all extremities  Psychiatry: Judgement and insight appears poor. Flat mood and affect    Data Reviewed: I have personally reviewed following labs and imaging studies  CBC: Recent Labs  Lab 01/23/22 1655 01/24/22 0505 01/26/22 0419  WBC 8.2 8.7 8.0  HGB 14.4 15.0 14.4  HCT 46.5 48.0 46.3  MCV 83.2 83.2 83.3  PLT 248 239 229  Basic Metabolic Panel: Recent Labs  Lab 01/23/22 1655 01/24/22 0505 01/25/22 0401 01/26/22 0419  NA 137 136 135 137  K 3.9 4.0 3.7 3.9  CL 103 103 103 100  CO2 23 23 20* 28  GLUCOSE 112* 107* 92 97  BUN 49* 50* 50* 53*  CREATININE 3.31* 3.04* 3.17* 3.45*  CALCIUM 9.0 8.8* 8.5* 8.6*  MG  --   --  2.3  --    GFR: Estimated Creatinine Clearance: 29.5 mL/min (A) (by C-G formula based on SCr of 3.45 mg/dL (H)). Liver Function Tests: Recent Labs  Lab 01/23/22 1655  AST 17  ALT 14  ALKPHOS 78  BILITOT 2.2*  PROT 7.1  ALBUMIN 3.5   Recent Labs  Lab 01/23/22 1655  LIPASE 26   No results for input(s): "AMMONIA" in the last 168  hours. Coagulation Profile: No results for input(s): "INR", "PROTIME" in the last 168 hours. Cardiac Enzymes: No results for input(s): "CKTOTAL", "CKMB", "CKMBINDEX", "TROPONINI" in the last 168 hours. BNP (last 3 results) No results for input(s): "PROBNP" in the last 8760 hours. HbA1C: No results for input(s): "HGBA1C" in the last 72 hours. CBG: No results for input(s): "GLUCAP" in the last 168 hours. Lipid Profile: No results for input(s): "CHOL", "HDL", "LDLCALC", "TRIG", "CHOLHDL", "LDLDIRECT" in the last 72 hours. Thyroid Function Tests: No results for input(s): "TSH", "T4TOTAL", "FREET4", "T3FREE", "THYROIDAB" in the last 72 hours. Anemia Panel: No results for input(s): "VITAMINB12", "FOLATE", "FERRITIN", "TIBC", "IRON", "RETICCTPCT" in the last 72 hours. Sepsis Labs: No results for input(s): "PROCALCITON", "LATICACIDVEN" in the last 168 hours.  Recent Results (from the past 240 hour(s))  Urine Culture     Status: Abnormal   Collection Time: 01/24/22  1:28 AM   Specimen: Urine, Random  Result Value Ref Range Status   Specimen Description   Final    URINE, RANDOM Performed at [facility], [address] [place], [place] [address]    Special Requests   Final    NONE Performed at [facility], [address] [place], [place] [address]    Culture >=100,000 COLONIES/mL ENTEROCOCCUS FAECALIS (A)  Final   Report Status 01/26/2022 FINAL  Final   Organism ID, Bacteria ENTEROCOCCUS FAECALIS (A)  Final      Susceptibility   Enterococcus faecalis - MIC*    AMPICILLIN <=2 SENSITIVE Sensitive     NITROFURANTOIN <=16 SENSITIVE Sensitive     VANCOMYCIN 1 SENSITIVE Sensitive     * >=100,000 COLONIES/mL ENTEROCOCCUS FAECALIS         Radiology Studies: No results found.      Scheduled Meds:  allopurinol  50 mg Oral Daily   apixaban  5 mg Oral BID   atorvastatin  80 mg Oral Daily   carvedilol  6.25 mg Oral BID WC   dapagliflozin propanediol  10 mg  Oral QAC breakfast   ferrous gluconate  324 mg Oral Q breakfast   furosemide  40 mg Intravenous Q12H   insulin aspart  0-5 Units Subcutaneous QHS   insulin aspart  0-9 Units Subcutaneous TID WC   levothyroxine  100 mcg Oral Q0600   Continuous Infusions:  ampicillin (OMNIPEN) IV Stopped (01/26/22 1423)     LOS: 2 days    Time spent: 36 mins     Wyvonnia Dusky, MD [facility] Pager [phone]  If 7PM-7AM, please contact night-coverage [email or web address] 01/26/2022, 3:54 PM

## 2022-01-26 NOTE — TOC Initial Note (Signed)
Transition of Care [facility]) - Initial/Assessment Note    Patient Details  Name: Hector [name] MRN: [id number] Date of Birth: 09/09/1965  Transition of Care [facility]) CM/SW Contact:    Quin Hoop, LCSW Phone Number: 01/26/2022, 2:30 PM  Clinical Narrative:                 CSW completed readmit assessment.  Pt states that he gets his medications from [facility] and he doesn't have any concerns with getting them.  Pt's PCP is Dr. Rebeca Alert.  He states that his brother takes him to his appointments.    CSW will follow for any changes to pt disharge.  Expected Discharge Plan: Home/Self Care Barriers to Discharge: Continued Medical Work up   Patient Goals and CMS Choice Patient states their goals for this hospitalization and ongoing recovery are:: to go home      Expected Discharge Plan and Services Expected Discharge Plan: Home/Self Care                                              Prior Living Arrangements/Services   Lives with:: Self                   Activities of Daily Living      Permission Sought/Granted                  Emotional Assessment Appearance::  (WNL) Attitude/Demeanor/Rapport: Engaged Affect (typically observed): Calm Orientation: : Oriented to Self, Oriented to Place, Oriented to  Time, Oriented to Situation Alcohol / Substance Use: Not Applicable    Admission diagnosis:  Urinary tract infection without hematuria, site unspecified [N39.0] Acute on chronic systolic (congestive) heart failure (HCC) [I50.23] Acute on chronic congestive heart failure, unspecified heart failure type ([place]) [I50.9] Patient Active Problem List   Diagnosis Date Noted   HLD (hyperlipidemia) 01/24/2022   Myocardial injury 01/24/2022   Acute on chronic combined systolic (congestive) and diastolic (congestive) heart failure ([place]) 01/06/2022   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 ([place]) 12/10/2021   Pulmonary nodule 12/07/2021   Acute on chronic congestive heart  failure ([place]) 11/18/2021   Gout 11/18/2021   Atrial fibrillation with RVR (HCC) 10/13/2021   Obesity (BMI 30-39.9) 10/01/2021   Acute on chronic systolic (congestive) heart failure ([place]) 09/28/2021   Right upper quadrant pain 09/10/2021   Nausea and vomiting 09/10/2021   Atrial flutter with rapid ventricular response ([place]) 03/01/2021   Hypothyroidism 11/30/2020   Stroke ([place])    Alcohol abuse    Chronic renal impairment, stage 4 (severe) (HCC)    Chronic systolic CHF (congestive heart failure) (HCC)    Iron deficiency anemia    CKD (chronic kidney disease) stage 4, GFR 15-29 ml/min ([place]) 07/21/2020   UTI (urinary tract infection) 01/30/2020   Acute renal failure superimposed on stage 3b chronic kidney disease ([place]) 12/31/2019   Acute respiratory failure with hypoxia ([place]) 12/22/2019    Class: Acute   Acute on chronic systolic CHF (congestive heart failure) ([place]) 07/21/2016   Obstructive sleep apnea 04/21/2016   Nonischemic cardiomyopathy ([place]) 05/26/2015   Chronic combined systolic (congestive) and diastolic (congestive) heart failure ([place]) 04/01/2015   Essential hypertension 04/01/2015   Nonischemic cardiomyopathy: EF 20-25% 09/21/2014   Paroxysmal atrial fibrillation ([place]) 08/16/2014   Paroxysmal atrial flutter ([place]) 08/14/2014    Class: Acute   Hypertensive  heart disease    Mixed hyperlipidemia    PCP:  [name], MD Pharmacy:   [facility] [address] ([address]), [place] [address] [place] ([place]) [place] [address] Phone: [phone] Fax: [phone]     Social Determinants of Health (SDOH) Interventions    Readmission Risk Interventions    12/09/2021    1:09 PM 10/14/2021    1:24 PM 03/04/2021   10:16 AM  Readmission Risk Prevention Plan  Transportation Screening Complete Complete Complete  PCP or Specialist Appt within 3-5 Days  Complete Complete  HRI or [place]   Complete  Social Work Consult for  [place] Planning/Counseling  Complete Complete  Palliative Care Screening  Not Applicable Not Applicable  Medication Review Press photographer) Complete Complete Complete  PCP or Specialist appointment within 3-5 days of discharge Complete    SW Recovery Care/Counseling Consult Complete    [place] Not Applicable

## 2022-01-26 NOTE — Progress Notes (Signed)
Pt continue to refuse CBG fingersticks. Spoke with pt about the importance of monitoring blood sugars. Pt still refusing.

## 2022-01-26 NOTE — Discharge Instructions (Signed)

## 2022-01-27 LAB — BASIC METABOLIC PANEL
Anion gap: 9 (ref 5–15)
BUN: 48 mg/dL — ABNORMAL HIGH (ref 6–20)
CO2: 25 mmol/L (ref 22–32)
Calcium: 8.6 mg/dL — ABNORMAL LOW (ref 8.9–10.3)
Chloride: 102 mmol/L (ref 98–111)
Creatinine, Ser: 3.03 mg/dL — ABNORMAL HIGH (ref 0.61–1.24)
GFR, Estimated: 23 mL/min — ABNORMAL LOW (ref >60)
Glucose, Bld: 98 mg/dL (ref 70–99)
Potassium: 4 mmol/L (ref 3.5–5.1)
Sodium: 136 mmol/L (ref 135–145)

## 2022-01-27 LAB — CBC
HCT: 46.2 % (ref 39.0–52.0)
Hemoglobin: 14.9 g/dL (ref 13.0–17.0)
MCH: 26.8 pg (ref 26.0–34.0)
MCHC: 32.3 g/dL (ref 30.0–36.0)
MCV: 82.9 fL (ref 80.0–100.0)
Platelets: 223 10*3/uL (ref 150–400)
RBC: 5.57 MIL/uL (ref 4.22–5.81)
RDW: 20.4 % — ABNORMAL HIGH (ref 11.5–15.5)
WBC: 8.4 10*3/uL (ref 4.0–10.5)
nRBC: 0 % (ref 0.0–0.2)

## 2022-01-27 MED ORDER — NITROFURANTOIN MACROCRYSTAL 100 MG PO CAPS: 100.0000 mg | ORAL_CAPSULE | Freq: Four times a day (QID) | ORAL | 0 refills | Status: AC

## 2022-01-27 NOTE — TOC Transition Note (Signed)
Transition of Care [facility]) - CM/SW Discharge Note   Patient Details  Name: DEQUANTE TREMAINE MRN: [id number] Date of Birth: 1965-12-04  Transition of [facility]) CM/SW Contact:  Gerilyn Pilgrim, LCSW Phone Number: 01/27/2022, 1:05 PM   Clinical Narrative:   CSW reviewed chart. No additional needs at this time.     Final next level of care: Home/Self Care Barriers to Discharge: Barriers Resolved   Patient Goals and CMS Choice Patient states their goals for this hospitalization and ongoing recovery are:: return home      Discharge Placement                    Patient and family notified of of transfer: 01/27/22  Discharge Plan and Services                                     Social Determinants of Health (SDOH) Interventions     Readmission Risk Interventions    12/09/2021    1:09 PM 10/14/2021    1:24 PM 03/04/2021   10:16 AM  Readmission Risk Prevention Plan  Transportation Screening Complete Complete Complete  PCP or Specialist Appt within 3-5 Days  Complete Complete  HRI or [place]   Complete  Social Work Consult for [place] Planning/Counseling  Complete Complete  Palliative Care Screening  Not Applicable Not Applicable  Medication Review Press photographer) Complete Complete Complete  PCP or Specialist appointment within 3-5 days of discharge Complete    SW Recovery Care/Counseling Consult Complete    [place] Not Applicable

## 2022-01-27 NOTE — Progress Notes (Addendum)
   Heart Failure Nurse Navigator Note  Met with patient today, he was sitting in on the edge of the bed, awake and alert.  In no acute distress.  States that he does not want to keep track of how much fluids he is taking in during the day nor to be concerned with his sodium intake.  Explained that I would gladly come back when he wants to take better care of himself.  Pricilla Riffle RN CHFN

## 2022-01-27 NOTE — Discharge Summary (Signed)
Physician Discharge Summary  Hector [name] YQM:[id number] DOB: 04-27-1965 DOA: 01/23/2022  PCP: Hector Median, MD  Admit date: 01/23/2022 Discharge date: 01/27/2022  Admitted From: home  Disposition:  home   Recommendations for Outpatient Follow-up:  Follow up with PCP in 1-2 weeks F/u w/ cardio, Dr. Rockey Situ, w/in 1 week   Home Health: no  Equipment/Devices:  Discharge Condition: stable  CODE STATUS: full  Diet recommendation: Heart Healthy / Carb Modified   Brief/Interim Summary: HPI was taken from Dr. Blaine Hamper: Hector [name] is a 56 y.o. 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   Patient states that he has shortness of breath for more than 3 days, which has been progressively worsening.  Patient also has worsening abdominal swelling, distention and abdominal pain, leg edema.  Patient has chest pressure and discomfort.  No cough, fever or chills.  Denies nausea vomiting or diarrhea.  Patient has increased urinary frequency, denies dysuria or burning on urination.   Data reviewed independently and ED Course: pt was found to have BNP 2291, troponin level 81, 69, stable renal function, positive urinalysis (hazy appearance, large amount of leukocyte, rare bacteria, WBC> 50), temperature normal, blood pressure 129/113, heart rate 112, 61, RR 20, oxygen saturation 94% on room air.  Chest x-ray showed pulmonary edema.  Patient is admitted to telemetry bed as inpatient.        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 High risk for re-admission secondary to non-compliance. Echo on 12/08/2021 showed EF<20%. Has 1+ leg edema, elevated BNP 2291, chest x-ray showed pulm edema, clinically consistent with CHF exacerbation. Continue on lasix. Monitor I/Os and daily weights. Continue on farxiga. Refused to receive CHF education  UTI: urine cx is growing enterococcus faecalis. Abx changed to IV ampicillin while inpatient and d/c home po nitrofurantoin    Myocardial injury: w/ minimally elevated troponins. Likely due to demand ischemia    CKDIV: Baseline Cr 3.25. Cr is trending down from day prior. Continue on lasix   HTN: continue on coreg. IV hydralazine prn    PAF: continue on eliquis, coreg    Hypothyroidism: continue on home dose of levothyroxine    DM2: HbA1c 6.8, fairly well controlled. Continue on SSI w/ accuchecks    Hx of CVA: continue on eliquis, statin   Iron deficiency anemia: not currently anemic   Gout: continue on allopurinol    HLD: continue on statin    Obesity: BMI 33.1. Would benefit from weight loss  Discharge Instructions  Discharge Instructions     Diet - low sodium heart healthy   Complete by: As directed    Discharge instructions   Complete by: As directed    F/u w/ cardio, Dr. Rockey Situ, w/in 1 week. F/u w/ PCP in 1-2 weeks  Increase activity slowly   Complete by: As directed       Allergies as of 01/27/2022       Reactions   Esomeprazole Magnesium Other (See Comments)   Suspected interstitial nephritis 2018   Eggs Or Egg-derived Products Rash        Medication List     TAKE these medications    allopurinol 100 MG tablet Commonly known as: ZYLOPRIM Take 0.5 tablets  (50 mg total) by mouth daily.   apixaban 5 MG Tabs tablet Commonly known as: ELIQUIS Take 1 tablet (5 mg total) by mouth 2 (two) times daily.   atorvastatin 80 MG tablet Commonly known as: LIPITOR Take 1 tablet (80 mg total) by mouth daily.   carvedilol 6.25 MG tablet Commonly known as: COREG Take 1 tablet (6.25 mg total) by mouth 2 (two) times daily with a meal.   dapagliflozin propanediol 10 MG Tabs tablet Commonly known as: Farxiga Take 1 tablet (10 mg total) by mouth daily before breakfast.   ferrous gluconate 324 MG tablet Commonly known as: FERGON Take 324 mg by mouth daily with breakfast.   levothyroxine 100 MCG tablet Commonly known as: SYNTHROID Take 1 tablet (100 mcg total) by mouth daily before breakfast.   nitrofurantoin 100 MG capsule Commonly known as: MACRODANTIN Take 1 capsule (100 mg total) by mouth 4 (four) times daily for 7 days.   potassium chloride SA 20 MEQ tablet Commonly known as: KLOR-CON M Take 1 tablet (20 mEq total) by mouth daily.   torsemide 20 MG tablet Commonly known as: DEMADEX Take 3 tablets (60 mg total) by mouth 2 (two) times daily.        Allergies  Allergen Reactions   Esomeprazole Magnesium Other (See Comments)    Suspected interstitial nephritis 2018   Eggs Or Egg-Derived Products Rash    Consultations:    Procedures/Studies: DG Chest 2 View  Result Date: 01/24/2022 CLINICAL DATA:  Fluid overload EXAM: CHEST two views COMPARISON:  01/06/2022. FINDINGS: Cardiac silhouette is prominent. There is pulmonary interstitial prominence with vascular congestion. No focal consolidation. No pneumothorax or pleural effusion identified. IMPRESSION: Findings suggest CHF. Electronically Signed   By: Sammie Bench M.D.   On: 01/24/2022 00:23   CT CHEST WO CONTRAST  Result Date: 01/06/2022 CLINICAL DATA:  Atrial fibrillation, lower extremity edema, short of breath EXAM: CT CHEST WITHOUT CONTRAST TECHNIQUE: Multidetector CT imaging  of the chest was performed following the standard protocol without IV contrast. RADIATION DOSE REDUCTION: This exam was performed according to the departmental dose-optimization program which includes automated exposure control, adjustment of the mA and/or kV according to patient size and/or use of iterative reconstruction technique. COMPARISON:  01/06/2022, 12/07/2021 FINDINGS: Cardiovascular: Unenhanced imaging demonstrates an enlarged heart without pericardial effusion. Normal caliber of the thoracic aorta. Atherosclerosis of the aorta and coronary vasculature. Evaluation of the vascular lumen is limited without IV contrast. Mediastinum/Nodes: No enlarged mediastinal or axillary lymph nodes. Thyroid gland, trachea, and esophagus demonstrate no significant findings. Lungs/Pleura: Stable bilateral calcified granulomata. Mild subpleural interlobular septal thickening consistent with Kerley lines and mild volume overload. No airspace disease, effusion, or pneumothorax. Central airways are patent. Scattered areas of subpleural scarring are again seen within the right upper and bilateral lower lobes. Upper Abdomen: No acute abnormality. Musculoskeletal: No acute or destructive bony lesions. Hypertrophic changes at the left first costochondral junction likely account for the density reported on recent x-ray. Reconstructed images demonstrate no additional findings. IMPRESSION: 1. Cardiomegaly. 2. Mild subpleural interlobular  septal thickening greatest at the bases, consistent with mild fluid overload. No overt edema. 3. Aortic Atherosclerosis (ICD10-I70.0). Coronary artery atherosclerosis. Electronically Signed   By: Randa Ngo M.D.   On: 01/06/2022 15:21   DG Chest Port 1 View  Result Date: 01/06/2022 CLINICAL DATA:  Shortness of breath EXAM: PORTABLE CHEST 1 VIEW COMPARISON:  12/07/2021 FINDINGS: Stable cardiomegaly. Pulmonary vascular congestion. Prominent perihilar and bibasilar interstitial markings. More  focal opacity in the left upper lobe. No large pleural fluid collection. No pneumothorax. IMPRESSION: 1. Cardiomegaly with pulmonary vascular congestion and interstitial edema. 2. More focal opacity in the left upper lobe, which may reflect alveolar edema or superimposed pneumonia. Electronically Signed   By: Davina Poke D.O.   On: 01/06/2022 10:47   (Echo, Carotid, EGD, Colonoscopy, ERCP)    Subjective: Pt denies any complaints    Discharge Exam: Vitals:   01/27/22 0727 01/27/22 1140  BP: (!) 110/93 110/78  Pulse: (!) 54 (!) 58  Resp: (!) 22 20  Temp: 98.5 F (36.9 C) 98.2 F (36.8 C)  SpO2: 95% 94%   Vitals:   01/27/22 0500 01/27/22 0603 01/27/22 0727 01/27/22 1140  BP:  (!) 110/93 (!) 110/93 110/78  Pulse:  64 (!) 54 (!) 58  Resp:  18 (!) 22 20  Temp:  99.2 F (37.3 C) 98.5 F (36.9 C) 98.2 F (36.8 C)  TempSrc:  Oral    SpO2:  98% 95% 94%  Weight: 111.2 kg     Height:        General: Pt is alert, awake, not in acute distress Cardiovascular: S1/S2 +, no rubs, no gallops Respiratory:decreased breath sounds b/l   Abdominal: Soft, NT, obese, bowel sounds + Extremities: no cyanosis    The results of significant diagnostics from this hospitalization (including imaging, microbiology, ancillary and laboratory) are listed below for reference.     Microbiology: Recent Results (from the past 240 hour(s))  Urine Culture     Status: Abnormal   Collection Time: 01/24/22  1:28 AM   Specimen: Urine, Random  Result Value Ref Range Status   Specimen Description   Final    URINE, RANDOM Performed at [facility], [address] [place], [place] [address]    Special Requests   Final    NONE Performed at [facility], [address] [place], [place] [address]    Culture >=100,000 COLONIES/mL ENTEROCOCCUS FAECALIS (A)  Final   Report Status 01/26/2022 FINAL  Final   Organism ID, Bacteria ENTEROCOCCUS FAECALIS (A)  Final      Susceptibility    Enterococcus faecalis - MIC*    AMPICILLIN <=2 SENSITIVE Sensitive     NITROFURANTOIN <=16 SENSITIVE Sensitive     VANCOMYCIN 1 SENSITIVE Sensitive     * >=100,000 COLONIES/mL ENTEROCOCCUS FAECALIS     Labs: BNP (last 3 results) Recent Labs    01/06/22 1048 01/24/22 0001 01/24/22 0505  BNP 1,949.1* 1,891.1* 3,825.0*   Basic Metabolic Panel: Recent Labs  Lab 01/23/22 1655 01/24/22 0505 01/25/22 0401 01/26/22 0419 01/27/22 0442  NA 137 136 135 137 136  K 3.9 4.0 3.7 3.9 4.0  CL 103 103 103 100 102  CO2 23 23 20* 28 25  GLUCOSE 112* 107* 92 97 98  BUN 49* 50* 50* 53* 48*  CREATININE 3.31* 3.04* 3.17* 3.45* 3.03*  CALCIUM 9.0 8.8* 8.5* 8.6* 8.6*  MG  --   --  2.3  --   --    Liver Function  Tests: Recent Labs  Lab 01/23/22 1655  AST 17  ALT 14  ALKPHOS 78  BILITOT 2.2*  PROT 7.1  ALBUMIN 3.5   Recent Labs  Lab 01/23/22 1655  LIPASE 26   No results for input(s): "AMMONIA" in the last 168 hours. CBC: Recent Labs  Lab 01/23/22 1655 01/24/22 0505 01/26/22 0419 01/27/22 0442  WBC 8.2 8.7 8.0 8.4  HGB 14.4 15.0 14.4 14.9  HCT 46.5 48.0 46.3 46.2  MCV 83.2 83.2 83.3 82.9  PLT 248 239 229 223   Cardiac Enzymes: No results for input(s): "CKTOTAL", "CKMB", "CKMBINDEX", "TROPONINI" in the last 168 hours. BNP: Invalid input(s): "POCBNP" CBG: No results for input(s): "GLUCAP" in the last 168 hours. D-Dimer No results for input(s): "DDIMER" in the last 72 hours. Hgb A1c No results for input(s): "HGBA1C" in the last 72 hours. Lipid Profile No results for input(s): "CHOL", "HDL", "LDLCALC", "TRIG", "CHOLHDL", "LDLDIRECT" in the last 72 hours. Thyroid function studies No results for input(s): "TSH", "T4TOTAL", "T3FREE", "THYROIDAB" in the last 72 hours.  Invalid input(s): "FREET3" Anemia work up No results for input(s): "VITAMINB12", "FOLATE", "FERRITIN", "TIBC", "IRON", "RETICCTPCT" in the last 72 hours. Urinalysis    Component Value Date/Time    COLORURINE YELLOW (A) 01/24/2022 0115   APPEARANCEUR HAZY (A) 01/24/2022 0115   APPEARANCEUR Clear 02/26/2014 1126   LABSPEC 1.010 01/24/2022 0115   LABSPEC 1.008 02/26/2014 1126   PHURINE 6.0 01/24/2022 0115   GLUCOSEU 150 (A) 01/24/2022 0115   GLUCOSEU Negative 02/26/2014 1126   HGBUR SMALL (A) 01/24/2022 0115   BILIRUBINUR NEGATIVE 01/24/2022 0115   BILIRUBINUR Negative 02/26/2014 1126   KETONESUR NEGATIVE 01/24/2022 0115   PROTEINUR >=300 (A) 01/24/2022 0115   NITRITE NEGATIVE 01/24/2022 0115   LEUKOCYTESUR LARGE (A) 01/24/2022 0115   LEUKOCYTESUR 1+ 02/26/2014 1126   Sepsis Labs Recent Labs  Lab 01/23/22 1655 01/24/22 0505 01/26/22 0419 01/27/22 0442  WBC 8.2 8.7 8.0 8.4   Microbiology Recent Results (from the past 240 hour(s))  Urine Culture     Status: Abnormal   Collection Time: 01/24/22  1:28 AM   Specimen: Urine, Random  Result Value Ref Range Status   Specimen Description   Final    URINE, RANDOM Performed at [facility], [address] [place], [place] [address]    Special Requests   Final    NONE Performed at [facility], [address] [place], [place] [address]    Culture >=100,000 COLONIES/mL ENTEROCOCCUS FAECALIS (A)  Final   Report Status 01/26/2022 FINAL  Final   Organism ID, Bacteria ENTEROCOCCUS FAECALIS (A)  Final      Susceptibility   Enterococcus faecalis - MIC*    AMPICILLIN <=2 SENSITIVE Sensitive     NITROFURANTOIN <=16 SENSITIVE Sensitive     VANCOMYCIN 1 SENSITIVE Sensitive     * >=100,000 COLONIES/mL ENTEROCOCCUS FAECALIS     Time coordinating discharge: Over 30 minutes  SIGNED:   Wyvonnia Dusky, MD  [facility] 01/27/2022, 12:31 PM Pager   If 7PM-7AM, please contact night-coverage [email or web address]

## 2022-01-28 ENCOUNTER — Telehealth (HOSPITAL_COMMUNITY): Payer: Self-pay

## 2022-01-28 NOTE — Telephone Encounter (Signed)
Contacted Hector [name] today to see if he would make his HF clinic appt tomorrow.  He states there is no way he can make a morning appt, he depends on his brother and he has to work til noon.  Contacted Tina with HF clinic rescheduled it til Monday at 1:20, advised him and he states that he can do that.  Also went over how much torsemide he should be taking.  He tells me he is only taking 1 twice a day.  Advised him his discharge says 3 twice a day.  He appeared to understand and says he will start taking 3 twice a day starting today.  He states been doing ok today.  He denies any problems.  He states he has not picked up his antibiotic yet, will today.  He states urinating well.  He states have about 30 torsemide left, advised him that his torsemide will be ready for pick up on Tuesday of next week.  (I had spoke with his pharmacy).  Will try to schedule a home visit with him next week.   [place] [phone]

## 2022-01-29 ENCOUNTER — Encounter: Payer: Medicaid Other | Admitting: Internal Medicine

## 2022-01-29 ENCOUNTER — Other Ambulatory Visit (HOSPITAL_COMMUNITY): Payer: Self-pay

## 2022-01-30 ENCOUNTER — Ambulatory Visit: Payer: Medicaid Other | Attending: Physician Assistant | Admitting: Nurse Practitioner

## 2022-01-30 ENCOUNTER — Encounter: Payer: Self-pay | Admitting: Nurse Practitioner

## 2022-01-30 VITALS — BP 102/80 | HR 111 | Ht 71.0 in | Wt 248.0 lb

## 2022-01-30 DIAGNOSIS — I5022 Chronic systolic (congestive) heart failure: Secondary | ICD-10-CM

## 2022-01-30 DIAGNOSIS — I10 Essential (primary) hypertension: Secondary | ICD-10-CM

## 2022-01-30 DIAGNOSIS — I4819 Other persistent atrial fibrillation: Secondary | ICD-10-CM | POA: Diagnosis not present

## 2022-01-30 DIAGNOSIS — N184 Chronic kidney disease, stage 4 (severe): Secondary | ICD-10-CM

## 2022-01-30 DIAGNOSIS — E782 Mixed hyperlipidemia: Secondary | ICD-10-CM

## 2022-01-30 DIAGNOSIS — E1122 Type 2 diabetes mellitus with diabetic chronic kidney disease: Secondary | ICD-10-CM

## 2022-01-30 DIAGNOSIS — G4733 Obstructive sleep apnea (adult) (pediatric): Secondary | ICD-10-CM

## 2022-01-30 DIAGNOSIS — I428 Other cardiomyopathies: Secondary | ICD-10-CM

## 2022-01-30 NOTE — Patient Instructions (Signed)
Medication Instructions:  Continue taking your medications as Rx *If you need a refill on your cardiac medications before your next appointment, please call your pharmacy*   Lab Work: None If you have labs (blood work) drawn today and your tests are completely normal, you will receive your results only by: [place] (if you have MyChart) OR A paper copy in the mail If you have any lab test that is abnormal or we need to change your treatment, we will call you to review the results.   Testing/Procedures: None   Follow-Up: At [facility], you and your health needs are our priority.  As part of our continuing mission to provide you with exceptional heart care, we have created designated Provider Care Teams.  These Care Teams include your primary Cardiologist (physician) and Advanced Practice Providers (APPs -  Physician Assistants and Nurse Practitioners) who all work together to provide you with the care you need, when you need it.  We recommend signing up for the patient portal called "MyChart".  Sign up information is provided on this After Visit Summary.  MyChart is used to connect with patients for Virtual Visits (Telemedicine).  Patients are able to view lab/test results, encounter notes, upcoming appointments, etc.  Non-urgent messages can be sent to your provider as well.   To learn more about what you can do with MyChart, go to [email or web address].    Your next appointment:   Follow up with Dr. Haroldine Laws and Murray Hodgkins as already scheduled.  Important Information About Sugar

## 2022-01-30 NOTE — Progress Notes (Signed)
Office Visit    Patient Name: Hector [name] Date of Encounter: 01/30/2022  Primary Care Provider:  [name], MD Primary Cardiologist:  Ida Rogue, MD  Chief Complaint    56 year old male with a history of chronic combined systolic and diastolic congestive heart failure, nonischemic cardiomyopathy presumed to be secondary to alcoholism, paroxysmal atrial fibrillation/flutter, hypertension, hyperlipidemia, type 2 diabetes mellitus, sleep apnea on CPAP, stage IV chronic kidney disease, hypothyroidism, medication noncompliance, and history of GI bleed on Eliquis, who presents for heart failure follow-up after several recent hospitalizations.  Past Medical History    Past Medical History:  Diagnosis Date   Alcohol abuse    Alcoholic cardiomyopathy ([place])    a. 12/2007 MV: EF 28%, no isch;  b. 8/12 Echo: EF 25-35%; c. 02/2014 Echo: EF 20-25%; d. 12/2014 Cath: minimal CAD; e. 01/2015 Echo: EF 50-55%;  d. 05/2016 Echo: EF 30-35%, diff HK, gr2 DD; e. 11/2016 Echo: EF 45-50%, diff HK; f. 09/2021 Echo: EF <20%; g. 11/2021 Echo: EF<20%.   Chronic combined systolic (congestive) and diastolic (congestive) heart failure ([place])    a. 05/2016 Echo: EF 30-35%; b. 11/2016 Echo: EF 45-50%, diff HK; c. 09/2021 Echo: EF < 20%; d. 11/2021 Echo: EF <20%, no rwma, Nl RV fxn, sev dil LA, mod-sev MR. mod dil PA w/ mildly elev PASP.   CKD (chronic kidney disease), stage III (HCC)    Elevated troponin (chronic)    Essential hypertension    GI bleed 11/2013   Hyperlipidemia    Pancreatitis    Paroxysmal A-fib (HCC)    a. new onset s/p unsuccessful TEE/DCCV on 08/16/2014; b. CHA2DS2VASc = 3-> eliquis (freq noncompliant); c. 02/2021 s/p TEE/DCCV; d. Prev on amio->d/c 2/2 abnl TFTs.   Paroxysmal atrial flutter (HCC)    Sleep-disordered breathing    Has yet to have a sleep study   Stroke [facility])    Past Surgical History:  Procedure Laterality Date   CARDIAC CATHETERIZATION N/A 01/09/2015    Procedure: Left Heart Cath and Coronary Angiography;  Surgeon: [name], MD;  Location: [place] [facility];  Service: Cardiovascular;  Laterality: N/A;   CARDIOVERSION N/A 03/05/2021   Procedure: CARDIOVERSION;  Surgeon: Kate Sable, MD;  Location: [facility] OR[facility];  Service: Cardiovascular;  Laterality: N/A;   COLONOSCOPY N/A 07/22/2020   Procedure: COLONOSCOPY;  Surgeon: Toledo, Benay Pike, MD;  Location: [facility] ENDOSCOPY;  Service: Gastroenterology;  Laterality: N/A;   ELECTROPHYSIOLOGIC STUDY N/A 08/16/2014   Procedure: CARDIOVERSION;  Surgeon: [name], MD;  Location: [facility];  Service: Cardiovascular;  Laterality: N/A;   FLEXIBLE SIGMOIDOSCOPY N/A 10/10/2015   Procedure: FLEXIBLE SIGMOIDOSCOPY;  Surgeon: [name], MD;  Location: [facility] ENDOSCOPY;  Service: Endoscopy;  Laterality: N/A;   KNEE SURGERY Right    NM MYOVIEW LTD  November 2011   No ischemia or infarction. EF 50-55% ( no improvement from 2009 Myoview EF of 28%   TEE WITHOUT CARDIOVERSION N/A 08/16/2014   Procedure: TRANSESOPHAGEAL ECHOCARDIOGRAM (TEE);  Surgeon: [name], MD;  Location: [facility];  Service: Cardiovascular;  Laterality: N/A;   TEE WITHOUT CARDIOVERSION N/A 03/05/2021   Procedure: TRANSESOPHAGEAL ECHOCARDIOGRAM (TEE);  Surgeon: Kate Sable, MD;  Location: [facility] OR[facility];  Service: Cardiovascular;  Laterality: N/A;    Allergies  Allergies  Allergen Reactions   Esomeprazole Magnesium Other (See Comments)    Suspected interstitial nephritis 2018   Eggs Or Egg-Derived Products Rash    History of Present Illness    56 year old  male with the above complex past medical history including chronic combined systolic and diastolic ingestive heart failure, nonischemic cardiomyopathy presumed to be secondary to alcoholism, paroxysmal atrial flutter/fibrillation, hypertension, hyperlipidemia, type 2 diabetes mellitus, sleep apnea on CPAP, stage IV chronic kidney disease, hypothyroidism, medication  noncompliance, and GI bleed on Eliquis.  Heart failure history dates back to at least 2009, and was felt to be secondary to alcohol use and atrial arrhythmias (fib/flutter with RVR).  EF was 20% at that time.  In the setting of noncompliance with anticoagulation and follow-up, he was rate controlled for prolonged period of time.  He subsequently underwent multiple noninvasive ischemic evaluations, which were low risk, and finally underwent diagnostic catheterization in 2016, which showed minimal, nonobstructive CAD.  Cardioversion was carried out in December 2016 with restoration to sinus rhythm, and his EF did improve.  He was later placed on amiodarone but this was subsequently discontinued secondary to abnormal thyroid function tests.  He has since been on levothyroxine.  In January 2023, he was readmitted with atrial flutter/fibrillation, and heart failure.  TEE and cardioversion was carried out with restoration of sinus rhythm.  EF was less than 20% with severely reduced RV function at that time.  He was seen by electrophysiology March 2023 in the outpatient setting, and reported noncompliance with medications and was not sure what he was taking.  In that setting, he was not felt to be suitable candidate for catheter ablation or ICD therapy.  In July 2023, he was admitted with complaints of abdominal discomfort and distention.  He underwent extensive GI workup including MRCP and HIDA scan, with recommendation for conservative therapy.  He was discharged home off of diuretic therapy due to worsening renal failure but required readmission in early August secondary to volume overload and noncompliance.  On admission, he was in sinus rhythm with PACs but subsequently developed A-fib with RVR during admission.  Home medications were resumed with good response.  Echo during admission showed an EF of less than 20% and he was discharged home in 111 kg.  He subsequently routinely skipped his medications with the  exception of torsemide and was not weighing himself at home.  He was subsequently readmitted with recurrent heart failure in late August 2023, late September 2023, mid October 2023, in mid November 2023, and most recently January 23, 2022.  On each occasion, he required aggressive diuresis under the guidance of nephrology in the setting of worsening renal failure.  On his most recent admission, he also was treated for a UTI (Enterococcus faecalis).  Most recent discharge weight was 245 pounds.    Since most recent discharge, he reports compliance with his medications.  Its only been 3 days but he has been feeling well without dyspnea or edema.  He has chronic complaints of right lower quadrant abdominal discomfort, which he attributes to retaining fluid though his abdomen is soft today.  His weight on our scale today is 3 pounds above his discharge weight though he does have on heavy shoes, jeans and a heavy jacket.  He has a scale at home and says he is finally taken out of the package but still has not used it since discharge.  He denies chest pain, dyspnea, palpitations, PND orthopnea, dizziness, syncope, edema, or early satiety.  Home Medications    Current Outpatient Medications  Medication Sig Dispense Refill   allopurinol (ZYLOPRIM) 100 MG tablet Take 0.5 tablets (50 mg total) by mouth daily. 15 tablet 3   apixaban (ELIQUIS)  5 MG TABS tablet Take 1 tablet (5 mg total) by mouth 2 (two) times daily. 60 tablet 6   atorvastatin (LIPITOR) 80 MG tablet Take 1 tablet (80 mg total) by mouth daily. 90 tablet 11   carvedilol (COREG) 6.25 MG tablet Take 1 tablet (6.25 mg total) by mouth 2 (two) times daily with a meal. 60 tablet 3   dapagliflozin propanediol (FARXIGA) 10 MG TABS tablet Take 1 tablet (10 mg total) by mouth daily before breakfast. 90 tablet 3   ferrous gluconate (FERGON) 324 MG tablet Take 324 mg by mouth daily with breakfast.     levothyroxine (SYNTHROID) 100 MCG tablet Take 1 tablet (100  mcg total) by mouth daily before breakfast. 30 tablet 11   nitrofurantoin (MACRODANTIN) 100 MG capsule Take 1 capsule (100 mg total) by mouth 4 (four) times daily for 7 days. 28 capsule 0   potassium chloride SA (KLOR-CON M) 20 MEQ tablet Take 1 tablet (20 mEq total) by mouth daily. 30 tablet 11   torsemide (DEMADEX) 20 MG tablet Take 3 tablets (60 mg total) by mouth 2 (two) times daily. 180 tablet 1   No current facility-administered medications for this visit.     Review of Systems    Reports compliance with medications.  He denies chest pain, palpitations, dyspnea, pnd, orthopnea, n, v, dizziness, syncope, edema, weight gain, or early satiety.  All other systems reviewed and are otherwise negative except as noted above.    Physical Exam    VS:  BP 102/80 (BP Location: Right Arm, Patient Position: Sitting, Cuff Size: Large)   Pulse (!) 111   Ht 5\' 11"  (1.803 m)   Wt 248 lb (112.5 kg)   SpO2 98%   BMI 34.59 kg/m  , BMI Body mass index is 34.59 kg/m.     GEN: Well nourished, well developed, in no acute distress. HEENT: normal. Neck: Supple, no JVD, carotid bruits, or masses. Cardiac: Irregularly irregular, tachycardic, no murmurs, rubs, or gallops. No clubbing, cyanosis, edema.  Radials 2+/PT 2+ and equal bilaterally.  Respiratory:  Respirations regular and unlabored, clear to auscultation bilaterally. GI: Soft, nontender, nondistended, BS + x 4. MS: no deformity or atrophy. Skin: warm and dry, no rash. Neuro:  Strength and sensation are intact. Psych: Normal affect.  Accessory Clinical Findings    ECG personally reviewed by me today -atrial fibrillation, 111, left axis deviation, left anterior fascicular block, poor R wave progression- no acute changes.  Lab Results  Component Value Date   WBC 8.4 01/27/2022   HGB 14.9 01/27/2022   HCT 46.2 01/27/2022   MCV 82.9 01/27/2022   PLT 223 01/27/2022   Lab Results  Component Value Date   CREATININE 3.03 (H) 01/27/2022    BUN 48 (H) 01/27/2022   NA 136 01/27/2022   K 4.0 01/27/2022   CL 102 01/27/2022   CO2 25 01/27/2022   Lab Results  Component Value Date   ALT 14 01/23/2022   AST 17 01/23/2022   ALKPHOS 78 01/23/2022   BILITOT 2.2 (H) 01/23/2022   Lab Results  Component Value Date   CHOL 141 01/09/2022   HDL 25 (L) 01/09/2022   LDLCALC 88 01/09/2022   TRIG 140 01/09/2022   CHOLHDL 5.6 01/09/2022    Lab Results  Component Value Date   HGBA1C 6.8 (H) 01/07/2022   Lab Results  Component Value Date   TSH 3.714 10/14/2021    Assessment & Plan    1.  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  7 heart failure admissions since July, and he was most recently discharged just 3 days ago at a weight of 245 pounds.   Most recent echo in October 2023 showed EF of less than 20% with moderate to severe much regurgitation.  He has not been weighing himself at home.  He is up 3 pounds on our scale but is also fully clothed with heavy sneakers, jeans, and a heavy jacket.  He is euvolemic on examination without significant JVD, he has been feeling well without significant dyspnea or edema.  His renal function was stable at discharge with a creatinine of 3.03.  He remains on beta-blocker, dapagliflozin, and torsemide 60 mg twice daily.  No medication changes today.  He is not a candidate for ACE inhibitor, ARB, ARNI, or MRA due to CKD 4.  He has follow-up with heart failure clinic in 3 days.  I did offer to move this appointment back a week however, he prefers to keep it as he is already arranged a ride.  He will certainly benefit from ongoing close monitoring.  He was previous evaluated by electrophysiology not felt to be an ICD candidate in the setting of long history of noncompliance.  Will have to monitor his  compliance going forward and reconsider device options.  2.  Persistent atrial fibrillation/flutter: Rate higher today at 111.  He typically runs in the high 90s to low 100s.  He reports compliance with his carvedilol.  He has no blood pressure room for further titration and is not a candidate for digoxin secondary to stage IV chronic kidney disease.  He was previously on amiodarone however, this resulted in abnormal thyroid function test resulting in discontinuation, and in the absence of antiarrhythmic therapy, he is not likely to be able to maintain sinus rhythm.  Seen by EP previously not felt to be a good candidate for ablation secondary to noncompliance.  3.  Essential hypertension: Stable.  Continue beta-blocker and diuretic therapy  4.  Stage IV chronic kidney disease: Creatinine relatively stable at 3.03 at discharge 3 days ago.  No change diuretic dosing today.  5.  Hyperlipidemia: On atorvastatin 80 mg.  His LDL was 88 in November, at which time he was not taking his atorvastatin.  6.  Nonobstructive CAD/demand ischemia: Minor regularities on catheterization 2016.  Frequently has minor troponin elevations in the setting of heart failure exacerbations.  Denies chest pain.  Continue beta-blocker and statin therapy.  No aspirin in the setting of Eliquis.  7.  Hypothyroidism: TSH 3.714 in August.  Remains on Synthroid.  8.  Type 2 diabetes mellitus: A1c 6.8 November 15.  Currently on Farxiga only.  Follow-up with primary care.  9.  Obstructive sleep apnea: Not typically compliant with CPAP.  10.  Disposition: Follow-up with heart failure clinic in 3 days.  Murray Hodgkins, NP 01/30/2022, 4:59 PM

## 2022-02-02 ENCOUNTER — Encounter: Payer: Self-pay | Admitting: Internal Medicine

## 2022-02-02 ENCOUNTER — Ambulatory Visit: Payer: Medicaid Other | Attending: Internal Medicine | Admitting: Internal Medicine

## 2022-02-02 VITALS — BP 126/80 | HR 84 | Resp 20 | Wt 248.2 lb

## 2022-02-02 DIAGNOSIS — R0609 Other forms of dyspnea: Secondary | ICD-10-CM | POA: Diagnosis not present

## 2022-02-02 DIAGNOSIS — Z87891 Personal history of nicotine dependence: Secondary | ICD-10-CM | POA: Insufficient documentation

## 2022-02-02 DIAGNOSIS — G4733 Obstructive sleep apnea (adult) (pediatric): Secondary | ICD-10-CM | POA: Insufficient documentation

## 2022-02-02 DIAGNOSIS — N184 Chronic kidney disease, stage 4 (severe): Secondary | ICD-10-CM | POA: Diagnosis not present

## 2022-02-02 DIAGNOSIS — E1122 Type 2 diabetes mellitus with diabetic chronic kidney disease: Secondary | ICD-10-CM | POA: Insufficient documentation

## 2022-02-02 DIAGNOSIS — Z79899 Other long term (current) drug therapy: Secondary | ICD-10-CM | POA: Insufficient documentation

## 2022-02-02 DIAGNOSIS — I2510 Atherosclerotic heart disease of native coronary artery without angina pectoris: Secondary | ICD-10-CM | POA: Diagnosis not present

## 2022-02-02 DIAGNOSIS — I5042 Chronic combined systolic (congestive) and diastolic (congestive) heart failure: Secondary | ICD-10-CM | POA: Insufficient documentation

## 2022-02-02 DIAGNOSIS — Z7901 Long term (current) use of anticoagulants: Secondary | ICD-10-CM | POA: Diagnosis not present

## 2022-02-02 DIAGNOSIS — Z7984 Long term (current) use of oral hypoglycemic drugs: Secondary | ICD-10-CM | POA: Insufficient documentation

## 2022-02-02 DIAGNOSIS — I130 Hypertensive heart and chronic kidney disease with heart failure and stage 1 through stage 4 chronic kidney disease, or unspecified chronic kidney disease: Secondary | ICD-10-CM | POA: Insufficient documentation

## 2022-02-02 DIAGNOSIS — I4819 Other persistent atrial fibrillation: Secondary | ICD-10-CM | POA: Insufficient documentation

## 2022-02-02 LAB — BRAIN NATRIURETIC PEPTIDE: B Natriuretic Peptide: 1722.9 pg/mL — ABNORMAL HIGH (ref 0.0–100.0)

## 2022-02-02 LAB — BASIC METABOLIC PANEL
Anion gap: 11 (ref 5–15)
BUN: 48 mg/dL — ABNORMAL HIGH (ref 6–20)
CO2: 26 mmol/L (ref 22–32)
Calcium: 9.1 mg/dL (ref 8.9–10.3)
Chloride: 103 mmol/L (ref 98–111)
Creatinine, Ser: 3.14 mg/dL — ABNORMAL HIGH (ref 0.61–1.24)
GFR, Estimated: 22 mL/min — ABNORMAL LOW (ref >60)
Glucose, Bld: 108 mg/dL — ABNORMAL HIGH (ref 70–99)
Potassium: 4.1 mmol/L (ref 3.5–5.1)
Sodium: 140 mmol/L (ref 135–145)

## 2022-02-02 MED ORDER — METOLAZONE 2.5 MG PO TABS: 2.5000 mg | ORAL_TABLET | Freq: Every day | ORAL | 3 refills | Status: DC

## 2022-02-02 MED ORDER — AMIODARONE HCL 200 MG PO TABS: 200.0000 mg | ORAL_TABLET | Freq: Every day | ORAL | 6 refills | Status: DC

## 2022-02-02 MED ORDER — POTASSIUM CHLORIDE CRYS ER 20 MEQ PO TBCR: 20.0000 meq | EXTENDED_RELEASE_TABLET | Freq: Every day | ORAL | 11 refills | Status: DC

## 2022-02-02 MED ORDER — AMIODARONE HCL 200 MG PO TABS: 200.0000 mg | ORAL_TABLET | Freq: Two times a day (BID) | ORAL | 3 refills | Status: DC

## 2022-02-02 MED ORDER — AMIODARONE HCL 200 MG PO TABS: 200.0000 mg | ORAL_TABLET | Freq: Every day | ORAL | 3 refills | Status: DC

## 2022-02-02 NOTE — Patient Instructions (Addendum)
Medication Changes:  START metolazone 2.5 mg every Monday and Friday, beginning today. You will take an extra 40 meq (2 tablets) of potassium on Monday and Friday after metolazone. START Amiodarone 200 mg twice daily   Lab Work:  Labs done today, your results will be available in MyChart, we will contact you for abnormal readings.   Testing/Procedures: You are scheduled for a Cardioversion on  January 9th, 2024 with Dr.Bensimhon at 7:30 am. Please arrive at the [facility] at 6:30 a.m. on the day of your procedure.  DIET INSTRUCTIONS:  Nothing to eat or drink after midnight except your medications with a small sip of water.         Labs: __________________  Medications:  Do NOT take metolazone or torsemide the morning of your cardioversion.  YOU MAY TAKE ALL of your remaining medications with a small amount of water. Make sure you DO take your eliquis the morning of the procedure.   Must have a responsible person to drive you home.  Bring a current list of your medications and current insurance cards.    If you have any questions after you get home, please call the office at [phone]   Referrals:  None  Special Instructions // Education:  Do the following things EVERYDAY: Weigh yourself in the morning before breakfast. Write it down and keep it in a log. Take your medicines as prescribed Eat low salt foods--Limit salt (sodium) to 2000 mg per day.  Stay as active as you can everyday Limit all fluids for the day to less than 2 liters   Follow-Up in:  2 weeks with Darylene Price, FNP, with labs 6 weeks with Dr. Haroldine Laws    If you have any questions or concerns before your next appointment please send [place] a message through [facility] or call our office at [phone] Monday-Friday 8 am-5 pm.   If you have an urgent need after hours on the weekend please call your Primary Cardiologist or the [facility] in [place] at [phone].

## 2022-02-02 NOTE — Progress Notes (Signed)
ADVANCED HF CLINIC NOTE  Referring Physician: Ida Rogue, MD Primary Care: [name], MD Primary Cardiologist: Ida Rogue, MD   HPI:   [name] is a 56 yo male with ETOH use, DM2, morbid obesity, PAF (h/o GIB on apixaban),  OSA on CPAP, CKD stage IV (Scr ~3.3), medication noncompliance.    HF dates back to at least 2009, thought to presumably be nonischemic in the setting of alcohol use, with an EF of 20%.     Echo 2016 EF 20-25%. LHC 12/2014 minimal CAD   EF initially improved with restoration of NSR but then dropped back down    He underwent TEE/DCCV in 02/2021 at that time showed an EF of less than 20% with severely reduced RV systolic function.  He was reevaluated by EP in 04/2021 and felt not to be a good candidate for ICD or ablation given medication noncompliance.   Admitted in 9/23 & 10/23 with ADHF. Limited echo 10/23 EF <  20% with moderate RV dysfunction and moderate to sever MR.  He was discharged from the hospital on 12/12/2021 with a weight of 247 pounds.     Admitted 11/23 with ADHF and diuresed. D/c'd 01/12/22. Readmitted 01/24/22 with recurrent HF. Diuresed and d/c'd on 01/27/22. Weight 244   Scr 3.4 -> 3.2 -> 3.0   Here for post-hospital f/u. Says his fluid is coming back. Belly getting distended. Drinking lots of water and juice. Has scale at home but not using it. Ate pork chops and gravy last night. Mild DOE. No CP.   ReDS 54%    Past Medical History:  Diagnosis Date   Alcohol abuse    Alcoholic cardiomyopathy ([place])    a. 12/2007 MV: EF 28%, no isch;  b. 8/12 Echo: EF 25-35%; c. 02/2014 Echo: EF 20-25%; d. 12/2014 Cath: minimal CAD; e. 01/2015 Echo: EF 50-55%;  d. 05/2016 Echo: EF 30-35%, diff HK, gr2 DD; e. 11/2016 Echo: EF 45-50%, diff HK; f. 09/2021 Echo: EF <20%; g. 11/2021 Echo: EF<20%.   Chronic combined systolic (congestive) and diastolic (congestive) heart failure ([place])    a. 05/2016 Echo: EF 30-35%; b. 11/2016 Echo: EF 45-50%,  diff HK; c. 09/2021 Echo: EF < 20%; d. 11/2021 Echo: EF <20%, no rwma, Nl RV fxn, sev dil LA, mod-sev MR. mod dil PA w/ mildly elev PASP.   CKD (chronic kidney disease), stage III (HCC)    Elevated troponin (chronic)    Essential hypertension    GI bleed 11/2013   Hyperlipidemia    Pancreatitis    Paroxysmal A-fib (HCC)    a. new onset s/p unsuccessful TEE/DCCV on 08/16/2014; b. CHA2DS2VASc = 3-> eliquis (freq noncompliant); c. 02/2021 s/p TEE/DCCV; d. Prev on amio->d/c 2/2 abnl TFTs.   Paroxysmal atrial flutter (HCC)    Sleep-disordered breathing    Has yet to have a sleep study   Stroke [facility])     Current Outpatient Medications  Medication Sig Dispense Refill   apixaban (ELIQUIS) 5 MG TABS tablet Take 1 tablet (5 mg total) by mouth 2 (two) times daily. 60 tablet 6   atorvastatin (LIPITOR) 80 MG tablet Take 1 tablet (80 mg total) by mouth daily. 90 tablet 11   carvedilol (COREG) 6.25 MG tablet Take 1 tablet (6.25 mg total) by mouth 2 (two) times daily with a meal. 60 tablet 3   dapagliflozin propanediol (FARXIGA) 10 MG TABS tablet Take 1 tablet (10 mg total) by mouth daily before breakfast. 90 tablet 3  ferrous gluconate (FERGON) 324 MG tablet Take 324 mg by mouth daily with breakfast.     levothyroxine (SYNTHROID) 100 MCG tablet Take 1 tablet (100 mcg total) by mouth daily before breakfast. 30 tablet 11   nitrofurantoin (MACRODANTIN) 100 MG capsule Take 1 capsule (100 mg total) by mouth 4 (four) times daily for 7 days. 28 capsule 0   potassium chloride SA (KLOR-CON M) 20 MEQ tablet Take 1 tablet (20 mEq total) by mouth daily. 30 tablet 11   torsemide (DEMADEX) 20 MG tablet Take 3 tablets (60 mg total) by mouth 2 (two) times daily. 180 tablet 1   allopurinol (ZYLOPRIM) 100 MG tablet Take 0.5 tablets (50 mg total) by mouth daily. (Patient not taking: Reported on 02/02/2022) 15 tablet 3   No current facility-administered medications for this visit.    Allergies  Allergen Reactions    Esomeprazole Magnesium Other (See Comments)    Suspected interstitial nephritis 2018   Eggs Or Egg-Derived Products Rash      Social History   Socioeconomic History   Marital status: Single    Spouse name: Not on file   Number of children: Not on file   Years of education: Not on file   Highest education level: Not on file  Occupational History   Not on file  Tobacco Use   Smoking status: Former    Packs/day: 1.00    Years: 12.00    Total pack years: 12.00    Types: Cigarettes    Quit date: 02/23/1998    Years since quitting: 23.9   Smokeless tobacco: Never  Vaping Use   Vaping Use: Never used  Substance and Sexual Activity   Alcohol use: Not Currently    Alcohol/week: 0.0 standard drinks of alcohol    Comment: Past heavy drinker   Drug use: No   Sexual activity: Not Currently  Other Topics Concern   Not on file  Social History Narrative   Not on file   Social Determinants of Health   Financial Resource Strain: Low Risk  (01/04/2019)   Overall Financial Resource Strain (CARDIA)    Difficulty of Paying Living Expenses: Not hard at all  Food Insecurity: No Food Insecurity (01/07/2022)   Hunger Vital Sign    Worried About Running Out of Food in the Last Year: Never true    Ran Out of Food in the Last Year: Never true  Transportation Needs: No Transportation Needs (01/07/2022)   PRAPARE - Hydrologist (Medical): No    Lack of Transportation (Non-Medical): No  Physical Activity: Inactive (01/04/2019)   Exercise Vital Sign    Days of Exercise per Week: 0 days    Minutes of Exercise per Session: 0 min  Stress: No Stress Concern Present (01/04/2019)   [place]    Feeling of Stress : Not at all  Social Connections: Moderately Isolated (01/04/2019)   Social Connection and Isolation Panel [NHANES]    Frequency of Communication with Friends and Family: More than three times a  week    Frequency of Social Gatherings with Friends and Family: More than three times a week    Attends Religious Services: 1 to 4 times per year    Active Member of [employer] or Organizations: No    Attends Archivist Meetings: Never    Marital Status: Never married  Intimate Partner Violence: Not At Risk (01/07/2022)   Humiliation, Afraid, Rape, and Kick questionnaire  Fear of Current or Ex-Partner: No    Emotionally Abused: No    Physically Abused: No    Sexually Abused: No      Family History  Problem Relation Age of Onset   Hypertension Mother    Hyperlipidemia Mother    Diabetes Mother     Vitals:   02/02/22 1312  BP: 126/80  Pulse: 84  Resp: 20  SpO2: 96%  Weight: 248 lb 4 oz (112.6 kg)   Wt Readings from Last 3 Encounters:  02/02/22 248 lb 4 oz (112.6 kg)  01/30/22 248 lb (112.5 kg)  01/27/22 245 lb 2.4 oz (111.2 kg)     PHYSICAL EXAM: General:  Well appearing. No respiratory difficulty HEENT: normal Neck: supple. no JVD. Carotids 2+ bilat; no bruits. No lymphadenopathy or thryomegaly appreciated. Cor: PMI nondisplaced. Irreg tachy . No rubs, gallops or murmurs. Lungs: clear Abdomen: soft, nontender, nondistended. No hepatosplenomegaly. No bruits or masses. Good bowel sounds. Extremities: no cyanosis, clubbing, rash, edema Neuro: alert & oriented x 3, cranial nerves grossly intact. moves all 4 extremities w/o difficulty. Affect pleasant.  ECG: AF 114 Personally reviewed    ASSESSMENT & PLAN:  1. A/c combined systolic and diastolic congestive heart failure/nonischemic cardiomyopathy - Onset 2009 - LHC 2016 minimal CAD -Limited echocardiogram 10/23. LVEF<20% RV moderately down moderate to severe MR -Has had multiple hospitalizations for heart failure exacerbation likely secondary to noncompliance -Patient was previously deemed not to be ICD insertion by EP due to noncompliance - Etiology of LV dysfunction remains unclear but may have a  component of tachy-CM with AF. Has been seen by EP and not felt to be candidate for ablation.  - NYHA III - Volume status elevated REDS 54% in setting in noncompliance with dietary restriction and daily weights - Start metolazone 2.5 + KCL 40 on Monday and Friday. Watch renal function closely.  - Continue carvedilol 6.25 bid - Continue Farxiga 10  - No ACE inhibitor/ARB/ARNI/MRA secondary to stage IV CKD - With CKD IV would not push BP too low with  hydral/nitrates at this time - May be getting close to need for HD soon but not sure he will tolerate with very low EF. For now would push IV diuresis to get him as dry as possible prior to d/c (likely close)  - Given CKD 4, tobacco use and obesity he is not candidate for advanced HF therapies - Will need close f/u with HF NP   Permanent atrial fibrillation/atrial flutter -Remains in AF with RVR -On carvedilol 6.25 mg twice daily -Continued on apixaban 5 mg twice daily for CHA2DS2-VASc score of at least 6 - Last DC-CV 1/23 was successful but reverted to AF - Previously seen by EP -> not candidate for ablation - Remains fast today. In looking back at ECG was in NSR in 8/23. Will start amio in an attempt to rate control and planDC-CV in 1 month.    CKD stage IV -Baseline Scr 2.8-3.4 -Check labs today. - Follow labs closely with metolazone -Avoid nephrotoxic agents where able   4.Nonobstructive coronary artery disease - Cath 2016   5. Obstructive sleep apnea -Patient states he uses CPAP at home   6. Type 2 diabetes -A1c 7.2 on 11/19/2021 -On Farxiga managed by PCP -Management per primary team   7. Tobacco use - said he no longer smokes      Glori Bickers, MD  1:37 PM

## 2022-02-02 NOTE — Progress Notes (Signed)
ReDS Vest / Clip - 02/02/22 1312       ReDS Vest / Clip   Station Marker C    Ruler Value 28    ReDS Value Range High volume overload    ReDS Actual Value 54

## 2022-02-19 ENCOUNTER — Ambulatory Visit (HOSPITAL_BASED_OUTPATIENT_CLINIC_OR_DEPARTMENT_OTHER): Payer: Medicaid Other | Admitting: Family

## 2022-02-19 ENCOUNTER — Other Ambulatory Visit: Payer: Self-pay

## 2022-02-19 ENCOUNTER — Other Ambulatory Visit
Admission: RE | Admit: 2022-02-19 | Discharge: 2022-02-19 | Disposition: A | Discharge status: Home / Self Care | Payer: Medicaid Other | Source: Ambulatory Visit | Attending: Family | Admitting: Family

## 2022-02-19 ENCOUNTER — Encounter: Payer: Self-pay | Admitting: Family

## 2022-02-19 VITALS — BP 121/98 | HR 58 | Resp 20 | Wt 237.0 lb

## 2022-02-19 DIAGNOSIS — R5383 Other fatigue: Secondary | ICD-10-CM | POA: Insufficient documentation

## 2022-02-19 DIAGNOSIS — Z79899 Other long term (current) drug therapy: Secondary | ICD-10-CM | POA: Diagnosis not present

## 2022-02-19 DIAGNOSIS — Z7984 Long term (current) use of oral hypoglycemic drugs: Secondary | ICD-10-CM | POA: Diagnosis not present

## 2022-02-19 DIAGNOSIS — N183 Chronic kidney disease, stage 3 unspecified: Secondary | ICD-10-CM | POA: Diagnosis not present

## 2022-02-19 DIAGNOSIS — G4733 Obstructive sleep apnea (adult) (pediatric): Secondary | ICD-10-CM | POA: Diagnosis not present

## 2022-02-19 DIAGNOSIS — I5022 Chronic systolic (congestive) heart failure: Secondary | ICD-10-CM | POA: Insufficient documentation

## 2022-02-19 DIAGNOSIS — I428 Other cardiomyopathies: Secondary | ICD-10-CM | POA: Diagnosis not present

## 2022-02-19 DIAGNOSIS — M2550 Pain in unspecified joint: Secondary | ICD-10-CM | POA: Diagnosis not present

## 2022-02-19 DIAGNOSIS — I4819 Other persistent atrial fibrillation: Secondary | ICD-10-CM | POA: Diagnosis not present

## 2022-02-19 DIAGNOSIS — I252 Old myocardial infarction: Secondary | ICD-10-CM | POA: Diagnosis not present

## 2022-02-19 DIAGNOSIS — I130 Hypertensive heart and chronic kidney disease with heart failure and stage 1 through stage 4 chronic kidney disease, or unspecified chronic kidney disease: Secondary | ICD-10-CM | POA: Diagnosis not present

## 2022-02-19 DIAGNOSIS — E785 Hyperlipidemia, unspecified: Secondary | ICD-10-CM | POA: Diagnosis not present

## 2022-02-19 DIAGNOSIS — M549 Dorsalgia, unspecified: Secondary | ICD-10-CM | POA: Diagnosis not present

## 2022-02-19 DIAGNOSIS — N184 Chronic kidney disease, stage 4 (severe): Secondary | ICD-10-CM

## 2022-02-19 DIAGNOSIS — I10 Essential (primary) hypertension: Secondary | ICD-10-CM | POA: Diagnosis not present

## 2022-02-19 LAB — BASIC METABOLIC PANEL
Anion gap: 17 — ABNORMAL HIGH (ref 5–15)
BUN: 97 mg/dL — ABNORMAL HIGH (ref 6–20)
CO2: 29 mmol/L (ref 22–32)
Calcium: 9.4 mg/dL (ref 8.9–10.3)
Chloride: 88 mmol/L — ABNORMAL LOW (ref 98–111)
Creatinine, Ser: 4.96 mg/dL — ABNORMAL HIGH (ref 0.61–1.24)
GFR, Estimated: 13 mL/min — ABNORMAL LOW (ref >60)
Glucose, Bld: 150 mg/dL — ABNORMAL HIGH (ref 70–99)
Potassium: 3.5 mmol/L (ref 3.5–5.1)
Sodium: 134 mmol/L — ABNORMAL LOW (ref 135–145)

## 2022-02-19 NOTE — Patient Instructions (Addendum)
Continue weighing daily and call for an overnight weight gain of 3 pounds or more or a weekly weight gain of more than 5 pounds.   If you have voicemail, please make sure your mailbox is cleaned out so that we may leave a message and please make sure to listen to any voicemails.    Make sure you call in your 2 medications that need to be refilled

## 2022-02-19 NOTE — Progress Notes (Signed)
[name], male    DOB: [birth year], 56 y.o.   MRN: [id number]   Hector [name] is a 56 y/o male with a history of MI, hyperlipidemia, HTN, CKD, atrial fibrillation, alcohol use, cardiomyopathy, and CHF.   Echo report on 12/08/21 showed an EF of <20% along with severe LAE and moderate/ severe Hector. Echo report from 09/29/21 reviewed and showed an EF of <20% along with moderate LAE and moderate Hector. TEE report from 03/05/21 reviewed and showed an EF of <20% along with mild/moderate Hector but no thrombus. Echo report from 09/13/20 reviewed showed an EF of 25-30% along with moderate Hector. Echo done 12/03/16 showed an EF of 45-50% which is an improvement from previous echo which was done on 06/02/16 and showed an EF of 30-35%. Prior echo on 02/21/15 which showed a normalization of his EF which was 50-55% with mild Hector. No longer ICD candidate. Prior echo on 03/07/14 showed an EF of 20-25% with mild/mod Hector.   Last left heart cath was done 01/09/15.   Admitted 01/23/22 due to acute on chronic HF. Admitted 01/06/22 due to acute on chronic HF. Admitted 11/18/21 due to acute on chronic heart failure. Initially given IV lasix with transition to oral diuretics with diuresis of > 17L. Discharged after 6 days. Admitted twice in August.   He presents today for a follow-up visit with a chief complaint of minimal shortness of breath with moderate exertion. Describes this as chronic although reports feeling markedly better since metolazone was added at last provider visit. Has associated fatigue, chronic pain and weight loss along with this. Denies any difficulty sleeping, dizziness, abdominal distention, palpitations, pedal edema, chest pain, cough or weight gain.   Has an empty bottle of eliquis & entresto but says that he has some at home and just has to call in refills for these medications.   Past Medical History:  Diagnosis Date   Alcohol abuse    Alcoholic cardiomyopathy ([place])    a. 12/2007 MV: EF 28%,  no isch;  b. 8/12 Echo: EF 25-35%; c. 02/2014 Echo: EF 20-25%; d. 12/2014 Cath: minimal CAD; e. 01/2015 Echo: EF 50-55%;  d. 05/2016 Echo: EF 30-35%, diff HK, gr2 DD; e. 11/2016 Echo: EF 45-50%, diff HK; f. 09/2021 Echo: EF <20%; g. 11/2021 Echo: EF<20%.   Chronic combined systolic (congestive) and diastolic (congestive) heart failure ([place])    a. 05/2016 Echo: EF 30-35%; b. 11/2016 Echo: EF 45-50%, diff HK; c. 09/2021 Echo: EF < 20%; d. 11/2021 Echo: EF <20%, no rwma, Nl RV fxn, sev dil LA, mod-sev Hector. mod dil PA w/ mildly elev PASP.   CKD (chronic kidney disease), stage III (HCC)    Elevated troponin (chronic)    Essential hypertension    GI bleed 11/2013   Hyperlipidemia    Pancreatitis    Paroxysmal A-fib (HCC)    a. new onset s/p unsuccessful TEE/DCCV on 08/16/2014; b. CHA2DS2VASc = 3-> eliquis (freq noncompliant); c. 02/2021 s/p TEE/DCCV; d. Prev on amio->d/c 2/2 abnl TFTs.   Paroxysmal atrial flutter (HCC)    Sleep-disordered breathing    Has yet to have a sleep study   Stroke [facility])    Past Surgical History:  Procedure Laterality Date   CARDIAC CATHETERIZATION N/A 01/09/2015   Procedure: Left Heart Cath and Coronary Angiography;  Surgeon: [name], MD;  Location: [facility];  Service: Cardiovascular;  Laterality: N/A;   CARDIOVERSION N/A 03/05/2021   Procedure: CARDIOVERSION;  Surgeon: Garen Lah,  Aaron Edelman, MD;  Location: [facility];  Service: Cardiovascular;  Laterality: N/A;   COLONOSCOPY N/A 07/22/2020   Procedure: COLONOSCOPY;  Surgeon: Toledo, Benay Pike, MD;  Location: [facility] ENDOSCOPY;  Service: Gastroenterology;  Laterality: N/A;   ELECTROPHYSIOLOGIC STUDY N/A 08/16/2014   Procedure: CARDIOVERSION;  Surgeon: [name], MD;  Location: [facility];  Service: Cardiovascular;  Laterality: N/A;   FLEXIBLE SIGMOIDOSCOPY N/A 10/10/2015   Procedure: FLEXIBLE SIGMOIDOSCOPY;  Surgeon: [name], MD;  Location: [facility] ENDOSCOPY;  Service: Endoscopy;  Laterality: N/A;   KNEE  SURGERY Right    NM MYOVIEW LTD  November 2011   No ischemia or infarction. EF 50-55% ( no improvement from 2009 Myoview EF of 28%   TEE WITHOUT CARDIOVERSION N/A 08/16/2014   Procedure: TRANSESOPHAGEAL ECHOCARDIOGRAM (TEE);  Surgeon: [name], MD;  Location: [facility];  Service: Cardiovascular;  Laterality: N/A;   TEE WITHOUT CARDIOVERSION N/A 03/05/2021   Procedure: TRANSESOPHAGEAL ECHOCARDIOGRAM (TEE);  Surgeon: Kate Sable, MD;  Location: [facility];  Service: Cardiovascular;  Laterality: N/A;   Family History  Problem Relation Age of Onset   Hypertension Mother    Hyperlipidemia Mother    Diabetes Mother    Social History   Tobacco Use   Smoking status: Former    Packs/day: 1.00    Years: 12.00    Total pack years: 12.00    Types: Cigarettes    Quit date: 02/23/1998    Years since quitting: 24.0   Smokeless tobacco: Never  Substance Use Topics   Alcohol use: Not Currently    Alcohol/week: 0.0 standard drinks of alcohol    Comment: Past heavy drinker   Allergies  Allergen Reactions   Esomeprazole Magnesium Other (See Comments)    Suspected interstitial nephritis 2018   Eggs Or Egg-Derived Products Rash   Prior to Admission medications   Medication Sig Start Date End Date Taking? Authorizing Provider  allopurinol (ZYLOPRIM) 100 MG tablet Take 0.5 tablets (50 mg total) by mouth daily. 10/03/21  Yes Danford, Suann Larry, MD  amiodarone (PACERONE) 200 MG tablet Take 1 tablet (200 mg total) by mouth 2 (two) times daily. 02/02/22  Yes Bensimhon, Shaune Pascal, MD  apixaban (ELIQUIS) 5 MG TABS tablet Take 1 tablet (5 mg total) by mouth 2 (two) times daily. 10/02/21  Yes Danford, Suann Larry, MD  atorvastatin (LIPITOR) 80 MG tablet Take 1 tablet (80 mg total) by mouth daily. 11/24/21  Yes Annita Brod, MD  carvedilol (COREG) 6.25 MG tablet Take 1 tablet (6.25 mg total) by mouth 2 (two) times daily with a meal. 10/02/21  Yes Danford, Suann Larry, MD  dapagliflozin  propanediol (FARXIGA) 10 MG TABS tablet Take 1 tablet (10 mg total) by mouth daily before breakfast. 10/02/21  Yes Danford, Suann Larry, MD  ferrous gluconate (FERGON) 324 MG tablet Take 324 mg by mouth daily with breakfast. 12/22/21  Yes [provider]  levothyroxine (SYNTHROID) 100 MCG tablet Take 1 tablet (100 mcg total) by mouth daily before breakfast. 11/24/21  Yes Annita Brod, MD  metolazone (ZAROXOLYN) 2.5 MG tablet Take 1 tablet (2.5 mg total) by mouth twice weekly on Mon and Fri 02/02/22  Yes Bensimhon, Shaune Pascal, MD  potassium chloride SA (KLOR-CON M) 20 MEQ tablet Take 1 tablet (20 mEq total) by mouth daily. Take an extra 2 tablets on Mon and Fri with metolazone. 02/02/22  Yes Bensimhon, Shaune Pascal, MD  torsemide (DEMADEX) 20 MG tablet Take 3 tablets (60 mg total) by mouth 2 (  two) times daily. 01/12/22  Yes Lorella Nimrod, MD    Review of Systems  Constitutional:  Positive for fatigue.  HENT:  Negative for congestion, postnasal drip and sore throat.   Eyes: Negative.   Respiratory:  Positive for shortness of breath (minimal). Negative for cough and chest tightness.   Cardiovascular:  Negative for chest pain, palpitations and leg swelling.  Gastrointestinal:  Negative for abdominal distention, abdominal pain, constipation, diarrhea and nausea.  Endocrine: Negative.   Genitourinary: Negative.   Musculoskeletal:  Positive for arthralgias (left hand) and back pain. Negative for neck pain.  Skin: Negative.   Allergic/Immunologic: Negative.   Neurological:  Negative for dizziness, weakness and light-headedness.  Hematological:  Negative for adenopathy. Does not bruise/bleed easily.  Psychiatric/Behavioral:  Negative for dysphoric mood and sleep disturbance (sleeping on 2 pillows). The patient is not nervous/anxious.    Vitals:   02/19/22 1447  BP: (!) 121/98  Pulse: (!) 58  Resp: 20  SpO2: 99%  Weight: 237 lb (107.5 kg)   Wt Readings from Last 3 Encounters:   02/19/22 237 lb (107.5 kg)  02/02/22 248 lb 4 oz (112.6 kg)  01/30/22 248 lb (112.5 kg)   Lab Results  Component Value Date   CREATININE 4.96 (H) 02/19/2022   CREATININE 3.14 (H) 02/02/2022   CREATININE 3.03 (H) 01/27/2022   Physical Exam Vitals and nursing note reviewed.  Constitutional:      General: He is not in acute distress.    Appearance: Normal appearance.  HENT:     Head: Normocephalic and atraumatic.  Neck:     Vascular: No JVD.  Cardiovascular:     Rate and Rhythm: Bradycardia present. Rhythm irregular.  Pulmonary:     Effort: Pulmonary effort is normal. No respiratory distress.     Breath sounds: No wheezing or rales.  Abdominal:     General: There is no distension.     Palpations: Abdomen is soft.     Tenderness: There is no abdominal tenderness.  Musculoskeletal:     Cervical back: Normal range of motion and neck supple.     Right lower leg: No edema.     Left lower leg: No edema.  Skin:    General: Skin is warm and dry.  Neurological:     General: No focal deficit present.     Mental Status: He is alert and oriented to person, place, and time.     Motor: Weakness present.  Psychiatric:        Mood and Affect: Mood normal.        Behavior: Behavior normal.        Thought Content: Thought content normal.     Assessment & Plan:  1: Chronic heart failure with reduced ejection fraction- - NYHA Class II - euvolemic today - weighing daily; reminded to call for an overnight weight gain of > 2 pounds or a weekly weight gain of > 5 pounds - weight down 13 pounds from last visit here 3 months ago - on GDMT of farxiga & carvedilol  - saw [facility] provider (Bensimhon) 02/02/22 & started on metolazone 2.5mg / 60meq potassium on M & F - ReDs reading 2 weeks ago was elevated at 54% - check BMP today - renal function will limit other GDMT - not adding salt to his food - saw cardiology Merrilee Jansky) 01/30/22 - saw EP Quentin Ore) 04/30/21 - BNP 11/18/21 was 2089.8  2:  HTN- - BP 121/98 - follows with PCP at [facility]  [place] from 02/02/22 reviewed and showed sodium 140, potassium 4.1, creatinine 3.14 and GFR 22 - saw nephrology ([place]) 06/11/21; he says that he has to call and get f/u appointment scheduled.   3: Obstructive sleep apnea- - sleeping well at night  - says that he's not wearing his CPAP and needs to "get back to using it"; explained the correlation between sleep apnea and HTN/HF - encouraged to wear his CPAP every night and any time he's napping during the day  4: Atrial fibrillation- - amio started at last visit with Dr Haroldine Laws on 02/02/22 - plan for DCCV on 03/03/22 - emphasized making sure that he does not run out of his eliquis leading up to cardioversion   Medication bottles reviewed.   Return in 6 weeks to see [facility] provider, sooner if needed and pending lab results from today

## 2022-02-20 ENCOUNTER — Other Ambulatory Visit: Payer: Self-pay

## 2022-02-20 ENCOUNTER — Encounter: Payer: Self-pay | Admitting: Family

## 2022-02-20 ENCOUNTER — Telehealth: Payer: Self-pay

## 2022-02-20 DIAGNOSIS — I5022 Chronic systolic (congestive) heart failure: Secondary | ICD-10-CM

## 2022-02-20 NOTE — Telephone Encounter (Addendum)
Patient verbalizes understanding and states he will only take metolazone on Fridays.  He states he is going to pick up his eliquis and entresto today at his pharmacy. He states he hasn't yet missed a dose of eliquis and that if he is able to pick it up from pharmacy today, he will not miss a dose. Informed patient to call clinic if he runs into any trouble getting medication from pharmacy this AM, and we will provide samples for eliquis. Explained to patient that he cannot miss a dose of eliquis or the cardioversion will need to be rescheduled. Patient verbalized understanding.  Georg Ruddle, RN ----- Message from Alisa Graff, [place] sent at 02/20/2022  9:02 AM EST ----- Kidney function has worsened since taking metolazone on M & F. Decrease this to Fridays only. Labs need to be rechecked next week. If he hasn't picked up his eliquis and entresto yet, he can come get samples today. He can not be out of his eliquis due to upcoming cardioversion.

## 2022-02-23 ENCOUNTER — Inpatient Hospital Stay
Admission: EM | Admit: 2022-02-23 | Discharge: 2022-03-05 | DRG: 683 | Disposition: A | Discharge status: Home / Self Care | Payer: Medicaid Other | Attending: Internal Medicine | Admitting: Internal Medicine

## 2022-02-23 ENCOUNTER — Emergency Department: Payer: Medicaid Other

## 2022-02-23 ENCOUNTER — Encounter: Payer: Self-pay | Admitting: Emergency Medicine

## 2022-02-23 ENCOUNTER — Other Ambulatory Visit: Payer: Self-pay

## 2022-02-23 DIAGNOSIS — E039 Hypothyroidism, unspecified: Secondary | ICD-10-CM | POA: Diagnosis present

## 2022-02-23 DIAGNOSIS — I5022 Chronic systolic (congestive) heart failure: Secondary | ICD-10-CM | POA: Diagnosis present

## 2022-02-23 DIAGNOSIS — N184 Chronic kidney disease, stage 4 (severe): Secondary | ICD-10-CM | POA: Diagnosis present

## 2022-02-23 DIAGNOSIS — N189 Chronic kidney disease, unspecified: Principal | ICD-10-CM | POA: Insufficient documentation

## 2022-02-23 DIAGNOSIS — E860 Dehydration: Secondary | ICD-10-CM | POA: Diagnosis not present

## 2022-02-23 DIAGNOSIS — D631 Anemia in chronic kidney disease: Secondary | ICD-10-CM | POA: Diagnosis present

## 2022-02-23 DIAGNOSIS — Z6833 Body mass index (BMI) 33.0-33.9, adult: Secondary | ICD-10-CM

## 2022-02-23 DIAGNOSIS — Z8616 Personal history of COVID-19: Secondary | ICD-10-CM

## 2022-02-23 DIAGNOSIS — I10 Essential (primary) hypertension: Secondary | ICD-10-CM | POA: Diagnosis not present

## 2022-02-23 DIAGNOSIS — E875 Hyperkalemia: Secondary | ICD-10-CM | POA: Diagnosis present

## 2022-02-23 DIAGNOSIS — Z1152 Encounter for screening for COVID-19: Secondary | ICD-10-CM

## 2022-02-23 DIAGNOSIS — Z8673 Personal history of transient ischemic attack (TIA), and cerebral infarction without residual deficits: Secondary | ICD-10-CM

## 2022-02-23 DIAGNOSIS — E871 Hypo-osmolality and hyponatremia: Secondary | ICD-10-CM | POA: Diagnosis present

## 2022-02-23 DIAGNOSIS — I426 Alcoholic cardiomyopathy: Secondary | ICD-10-CM | POA: Diagnosis present

## 2022-02-23 DIAGNOSIS — E861 Hypovolemia: Secondary | ICD-10-CM | POA: Diagnosis present

## 2022-02-23 DIAGNOSIS — E1129 Type 2 diabetes mellitus with other diabetic kidney complication: Secondary | ICD-10-CM | POA: Diagnosis present

## 2022-02-23 DIAGNOSIS — N179 Acute kidney failure, unspecified: Secondary | ICD-10-CM | POA: Diagnosis not present

## 2022-02-23 DIAGNOSIS — R1084 Generalized abdominal pain: Secondary | ICD-10-CM

## 2022-02-23 DIAGNOSIS — Z79890 Hormone replacement therapy (postmenopausal): Secondary | ICD-10-CM

## 2022-02-23 DIAGNOSIS — Z7901 Long term (current) use of anticoagulants: Secondary | ICD-10-CM | POA: Diagnosis not present

## 2022-02-23 DIAGNOSIS — G4733 Obstructive sleep apnea (adult) (pediatric): Secondary | ICD-10-CM | POA: Diagnosis present

## 2022-02-23 DIAGNOSIS — R109 Unspecified abdominal pain: Secondary | ICD-10-CM | POA: Diagnosis present

## 2022-02-23 DIAGNOSIS — Z83438 Family history of other disorder of lipoprotein metabolism and other lipidemia: Secondary | ICD-10-CM

## 2022-02-23 DIAGNOSIS — K5900 Constipation, unspecified: Secondary | ICD-10-CM | POA: Diagnosis present

## 2022-02-23 DIAGNOSIS — E785 Hyperlipidemia, unspecified: Secondary | ICD-10-CM | POA: Diagnosis present

## 2022-02-23 DIAGNOSIS — Z833 Family history of diabetes mellitus: Secondary | ICD-10-CM

## 2022-02-23 DIAGNOSIS — E876 Hypokalemia: Secondary | ICD-10-CM | POA: Diagnosis present

## 2022-02-23 DIAGNOSIS — Z87891 Personal history of nicotine dependence: Secondary | ICD-10-CM

## 2022-02-23 DIAGNOSIS — E1122 Type 2 diabetes mellitus with diabetic chronic kidney disease: Secondary | ICD-10-CM | POA: Diagnosis present

## 2022-02-23 DIAGNOSIS — I480 Paroxysmal atrial fibrillation: Secondary | ICD-10-CM | POA: Diagnosis not present

## 2022-02-23 DIAGNOSIS — F1011 Alcohol abuse, in remission: Secondary | ICD-10-CM | POA: Diagnosis present

## 2022-02-23 DIAGNOSIS — B338 Other specified viral diseases: Secondary | ICD-10-CM | POA: Diagnosis not present

## 2022-02-23 DIAGNOSIS — Z7984 Long term (current) use of oral hypoglycemic drugs: Secondary | ICD-10-CM

## 2022-02-23 DIAGNOSIS — I639 Cerebral infarction, unspecified: Secondary | ICD-10-CM | POA: Diagnosis present

## 2022-02-23 DIAGNOSIS — B974 Respiratory syncytial virus as the cause of diseases classified elsewhere: Secondary | ICD-10-CM | POA: Diagnosis present

## 2022-02-23 DIAGNOSIS — I5042 Chronic combined systolic (congestive) and diastolic (congestive) heart failure: Secondary | ICD-10-CM | POA: Diagnosis present

## 2022-02-23 DIAGNOSIS — I130 Hypertensive heart and chronic kidney disease with heart failure and stage 1 through stage 4 chronic kidney disease, or unspecified chronic kidney disease: Secondary | ICD-10-CM | POA: Diagnosis present

## 2022-02-23 DIAGNOSIS — E669 Obesity, unspecified: Secondary | ICD-10-CM | POA: Diagnosis present

## 2022-02-23 DIAGNOSIS — Z79899 Other long term (current) drug therapy: Secondary | ICD-10-CM

## 2022-02-23 DIAGNOSIS — N390 Urinary tract infection, site not specified: Secondary | ICD-10-CM | POA: Diagnosis present

## 2022-02-23 DIAGNOSIS — Z8249 Family history of ischemic heart disease and other diseases of the circulatory system: Secondary | ICD-10-CM

## 2022-02-23 LAB — COMPREHENSIVE METABOLIC PANEL
ALT: 28 U/L (ref 0–44)
AST: 38 U/L (ref 15–41)
Albumin: 4.1 g/dL (ref 3.5–5.0)
Alkaline Phosphatase: 112 U/L (ref 38–126)
Anion gap: 19 — ABNORMAL HIGH (ref 5–15)
BUN: 110 mg/dL — ABNORMAL HIGH (ref 6–20)
CO2: 28 mmol/L (ref 22–32)
Calcium: 9.6 mg/dL (ref 8.9–10.3)
Chloride: 82 mmol/L — ABNORMAL LOW (ref 98–111)
Creatinine, Ser: 5.26 mg/dL — ABNORMAL HIGH (ref 0.61–1.24)
GFR, Estimated: 12 mL/min — ABNORMAL LOW (ref >60)
Glucose, Bld: 210 mg/dL — ABNORMAL HIGH (ref 70–99)
Potassium: 3.7 mmol/L (ref 3.5–5.1)
Sodium: 129 mmol/L — ABNORMAL LOW (ref 135–145)
Total Bilirubin: 1.9 mg/dL — ABNORMAL HIGH (ref 0.3–1.2)
Total Protein: 8.5 g/dL — ABNORMAL HIGH (ref 6.5–8.1)

## 2022-02-23 LAB — OSMOLALITY, URINE: Osmolality, Ur: 334 mOsm/kg (ref 300–900)

## 2022-02-23 LAB — URINALYSIS, ROUTINE W REFLEX MICROSCOPIC
Bacteria, UA: NONE SEEN
Bilirubin Urine: NEGATIVE
Glucose, UA: >=500 mg/dL — AB
Ketones, ur: NEGATIVE mg/dL
Nitrite: NEGATIVE
Protein, ur: >=300 mg/dL — AB
Specific Gravity, Urine: 1.009 (ref 1.005–1.030)
pH: 7 (ref 5.0–8.0)

## 2022-02-23 LAB — CBC
HCT: 60.9 % — ABNORMAL HIGH (ref 39.0–52.0)
Hemoglobin: 19.8 g/dL — ABNORMAL HIGH (ref 13.0–17.0)
MCH: 26.1 pg (ref 26.0–34.0)
MCHC: 32.5 g/dL (ref 30.0–36.0)
MCV: 80.1 fL (ref 80.0–100.0)
Platelets: 220 10*3/uL (ref 150–400)
RBC: 7.6 MIL/uL — ABNORMAL HIGH (ref 4.22–5.81)
RDW: 19.2 % — ABNORMAL HIGH (ref 11.5–15.5)
WBC: 6.9 10*3/uL (ref 4.0–10.5)
nRBC: 0 % (ref 0.0–0.2)

## 2022-02-23 LAB — RESP PANEL BY RT-PCR (RSV, FLU A&B, COVID)  RVPGX2
Influenza A by PCR: NEGATIVE
Influenza B by PCR: NEGATIVE
Resp Syncytial Virus by PCR: POSITIVE — AB
SARS Coronavirus 2 by RT PCR: NEGATIVE

## 2022-02-23 LAB — OSMOLALITY: Osmolality: 317 mOsm/kg — ABNORMAL HIGH (ref 275–295)

## 2022-02-23 LAB — BRAIN NATRIURETIC PEPTIDE: B Natriuretic Peptide: 609.5 pg/mL — ABNORMAL HIGH (ref 0.0–100.0)

## 2022-02-23 LAB — SODIUM, URINE, RANDOM: Sodium, Ur: 23 mmol/L

## 2022-02-23 LAB — LIPASE, BLOOD: Lipase: 94 U/L — ABNORMAL HIGH (ref 11–51)

## 2022-02-23 MED ORDER — HYDRALAZINE HCL 20 MG/ML IJ SOLN: 5.0000 mg | INTRAMUSCULAR | Status: DC | PRN

## 2022-02-23 MED ORDER — SENNOSIDES-DOCUSATE SODIUM 8.6-50 MG PO TABS
1.0000 | ORAL_TABLET | Freq: Two times a day (BID) | ORAL | Status: DC
  Administered 2022-02-23 – 2022-02-27 (×2): 1 via ORAL
  Filled 2022-02-23 (×15): qty 1

## 2022-02-23 MED ORDER — SODIUM CHLORIDE 1 G PO TABS: 1.0000 g | ORAL_TABLET | Freq: Two times a day (BID) | ORAL | Status: DC

## 2022-02-23 MED ORDER — LEVOTHYROXINE SODIUM 100 MCG PO TABS
100.0000 ug | ORAL_TABLET | Freq: Every day | ORAL | Status: DC
  Administered 2022-02-24 – 2022-03-05 (×10): 100 ug via ORAL
  Filled 2022-02-23 (×10): qty 1

## 2022-02-23 MED ORDER — POLYETHYLENE GLYCOL 3350 17 G PO PACK
17.0000 g | PACK | Freq: Every day | ORAL | Status: DC | PRN
  Filled 2022-02-23: qty 1

## 2022-02-23 MED ORDER — CARVEDILOL 6.25 MG PO TABS
6.2500 mg | ORAL_TABLET | Freq: Two times a day (BID) | ORAL | Status: DC
  Administered 2022-02-23 – 2022-03-05 (×18): 6.25 mg via ORAL
  Filled 2022-02-23 (×20): qty 1

## 2022-02-23 MED ORDER — ALBUTEROL SULFATE (2.5 MG/3ML) 0.083% IN NEBU: 2.0000 mL | INHALATION_SOLUTION | RESPIRATORY_TRACT | Status: DC | PRN

## 2022-02-23 MED ORDER — FERROUS GLUCONATE 324 (38 FE) MG PO TABS
324.0000 mg | ORAL_TABLET | Freq: Every day | ORAL | Status: DC
  Administered 2022-02-24 – 2022-03-05 (×9): 324 mg via ORAL
  Filled 2022-02-23 (×11): qty 1

## 2022-02-23 MED ORDER — ATORVASTATIN CALCIUM 20 MG PO TABS
80.0000 mg | ORAL_TABLET | Freq: Every day | ORAL | Status: DC
  Administered 2022-02-23 – 2022-03-05 (×11): 80 mg via ORAL
  Filled 2022-02-23 (×11): qty 4

## 2022-02-23 MED ORDER — ACETAMINOPHEN 325 MG PO TABS: 650.0000 mg | ORAL_TABLET | Freq: Four times a day (QID) | ORAL | Status: DC | PRN

## 2022-02-23 MED ORDER — AMIODARONE HCL 200 MG PO TABS
200.0000 mg | ORAL_TABLET | Freq: Two times a day (BID) | ORAL | Status: DC
  Administered 2022-02-23 – 2022-03-05 (×19): 200 mg via ORAL
  Filled 2022-02-23 (×20): qty 1

## 2022-02-23 MED ORDER — INSULIN ASPART 100 UNIT/ML IJ SOLN: 0.0000 [IU] | Freq: Every day | INTRAMUSCULAR | Status: DC

## 2022-02-23 MED ORDER — SODIUM CHLORIDE 0.9 % IV SOLN: INTRAVENOUS | Status: DC

## 2022-02-23 MED ORDER — ALLOPURINOL 100 MG PO TABS
50.0000 mg | ORAL_TABLET | Freq: Every day | ORAL | Status: DC
  Administered 2022-02-23 – 2022-03-05 (×11): 50 mg via ORAL
  Filled 2022-02-23 (×5): qty 1
  Filled 2022-02-23: qty 0.5
  Filled 2022-02-23 (×4): qty 1
  Filled 2022-02-23: qty 0.5

## 2022-02-23 MED ORDER — APIXABAN 5 MG PO TABS
5.0000 mg | ORAL_TABLET | Freq: Two times a day (BID) | ORAL | Status: DC
  Administered 2022-02-23 – 2022-03-05 (×20): 5 mg via ORAL
  Filled 2022-02-23 (×20): qty 1

## 2022-02-23 MED ORDER — SODIUM CHLORIDE 0.9 % IV SOLN
2.0000 g | INTRAVENOUS | Status: DC
  Administered 2022-02-23: 2 g via INTRAVENOUS
  Filled 2022-02-23: qty 20

## 2022-02-23 MED ORDER — INSULIN ASPART 100 UNIT/ML IJ SOLN: 0.0000 [IU] | Freq: Three times a day (TID) | INTRAMUSCULAR | Status: DC

## 2022-02-23 MED ORDER — DM-GUAIFENESIN ER 30-600 MG PO TB12
1.0000 | ORAL_TABLET | Freq: Two times a day (BID) | ORAL | Status: DC | PRN
  Administered 2022-03-01: 1 via ORAL
  Filled 2022-02-23: qty 1

## 2022-02-23 MED ORDER — ONDANSETRON HCL 4 MG/2ML IJ SOLN
4.0000 mg | Freq: Three times a day (TID) | INTRAMUSCULAR | Status: DC | PRN
  Administered 2022-03-02 – 2022-03-03 (×3): 4 mg via INTRAVENOUS
  Filled 2022-02-23 (×3): qty 2

## 2022-02-23 NOTE — H&P (Addendum)
History and Physical    Hector [name] IOX:[id number] DOB: July 28, 1965 DOA: 02/23/2022  Referring MD/NP/PA:   PCP: Hector Median, MD   Patient coming from:  The patient is coming from home.   Chief Complaint: Nausea, vomiting, abdominal pain, generalized weakness  HPI: Hector [name] is a 57 y.o. male with medical history significant of  A. fib on Eliquis, pancreatitis, sCHF with EF < 20%, CKD-IV, HTN, HLD, stroke, hypothyroidism, alcohol abuse in remission, GI bleeding, OSA, anemia, COVID infection 06/19/2020, who presents with nausea, vomiting, abdominal pain, generalized weakness.  Patient states that he has been sick for more than 3 days.  He has nausea, vomiting and abdominal pain.  He has vomited twice with nonbilious nonbloody vomiting.  He is constipated.  No diarrhea.  His abdominal pain is located in left side of abdomen, sharp, 7 out of 10 in severity, radiating to the left flank area.  Patient has a mild dry cough, denies shortness breath, chest pain, fever or chills.  Patient has increased urinary frequency, which he attributes to diuretic use.  No dysuria or burning with urination.  He states that he took his last dose of Eliquis this morning.  Patient has generalized weakness.  Data reviewed independently and ED Course: pt was found to have worsening renal function with creatinine 5.26, BUN 110, GFR 12 (recent baseline creatinine 2.5-3.0), WBC 6.9, positive urinalysis (clear appearance, moderate amount of leukocyte, negative bacteria, WBC 21-50), sodium 129, lipase 94, BNP 669, temperature normal, blood pressure 110/80, heart rate 60, RR 18, oxygen saturation 96% on room air.  Chest x-ray with cardiomegaly and peribronchial thickening.  CT of abdomen/pelvis is negative for acute intra-abdominal issues, but showed constipation.  Patient is admitted to telemetry bed as inpatient.  Dr. Holley Raring of renal is consulted.  CT-abd/pelvis: 1. No definite acute findings are noted  in the abdomen or pelvis. 2. There is a large volume of well-formed stool in the rectal vault, which could suggest constipation. 3. Small umbilical hernia containing only omental fat. No associated bowel incarceration or obstruction at this time. 4. Cholelithiasis without evidence of acute cholecystitis. 5. Colonic diverticulosis without evidence of acute diverticulitis at this time. 6. Aortic atherosclerosis. 7. Additional incidental findings, as above.    EKG: I have personally reviewed.  QTc 455, regular, PVC, poor R wave progression, anteroseptal infarction pattern   Review of Systems:   General: no fevers, chills, no body weight gain, has poor appetite, has fatigue HEENT: no blurry vision, hearing changes or sore throat Respiratory: no dyspnea, coughing, wheezing CV: no chest pain, no palpitations GI: has nausea, vomiting, abdominal pain, constipation GU: no dysuria, burning on urination, has increased urinary frequency, no hematuria  Ext: no leg edema Neuro: no unilateral weakness, numbness, or tingling, no vision change or hearing loss Skin: no rash, no skin tear. MSK: No muscle spasm, no deformity, no limitation of range of movement in spin Heme: No easy bruising.  Travel history: No recent long distant travel.   Allergy:  Allergies  Allergen Reactions   Esomeprazole Magnesium Other (See Comments)    Suspected interstitial nephritis 2018   Eggs Or Egg-Derived Products Rash    Past Medical History:  Diagnosis Date   Alcohol abuse    Alcoholic cardiomyopathy ([place])    a. 12/2007 MV: EF 28%, no isch;  b. 8/12 Echo: EF 25-35%; c. 02/2014 Echo: EF 20-25%; d. 12/2014 Cath: minimal CAD; e. 01/2015 Echo: EF 50-55%;  d. 05/2016 Echo: EF 30-35%, diff  HK, gr2 DD; e. 11/2016 Echo: EF 45-50%, diff HK; f. 09/2021 Echo: EF <20%; g. 11/2021 Echo: EF<20%.   Chronic combined systolic (congestive) and diastolic (congestive) heart failure ([place])    a. 05/2016 Echo: EF 30-35%; b. 11/2016  Echo: EF 45-50%, diff HK; c. 09/2021 Echo: EF < 20%; d. 11/2021 Echo: EF <20%, no rwma, Nl RV fxn, sev dil LA, mod-sev MR. mod dil PA w/ mildly elev PASP.   CKD (chronic kidney disease), stage III (HCC)    Elevated troponin (chronic)    Essential hypertension    GI bleed 11/2013   Hyperlipidemia    Pancreatitis    Paroxysmal A-fib (HCC)    a. new onset s/p unsuccessful TEE/DCCV on 08/16/2014; b. CHA2DS2VASc = 3-> eliquis (freq noncompliant); c. 02/2021 s/p TEE/DCCV; d. Prev on amio->d/c 2/2 abnl TFTs.   Paroxysmal atrial flutter (HCC)    Sleep-disordered breathing    Has yet to have a sleep study   Stroke [facility])     Past Surgical History:  Procedure Laterality Date   CARDIAC CATHETERIZATION N/A 01/09/2015   Procedure: Left Heart Cath and Coronary Angiography;  Surgeon: [name], MD;  Location: [place] [facility];  Service: Cardiovascular;  Laterality: N/A;   CARDIOVERSION N/A 03/05/2021   Procedure: CARDIOVERSION;  Surgeon: Kate Sable, MD;  Location: [facility] ORS;  Service: Cardiovascular;  Laterality: N/A;   COLONOSCOPY N/A 07/22/2020   Procedure: COLONOSCOPY;  Surgeon: Toledo, Benay Pike, MD;  Location: [facility] ENDOSCOPY;  Service: Gastroenterology;  Laterality: N/A;   ELECTROPHYSIOLOGIC STUDY N/A 08/16/2014   Procedure: CARDIOVERSION;  Surgeon: [name], MD;  Location: [facility] ORS;  Service: Cardiovascular;  Laterality: N/A;   FLEXIBLE SIGMOIDOSCOPY N/A 10/10/2015   Procedure: FLEXIBLE SIGMOIDOSCOPY;  Surgeon: [name], MD;  Location: [facility] ENDOSCOPY;  Service: Endoscopy;  Laterality: N/A;   KNEE SURGERY Right    NM MYOVIEW LTD  November 2011   No ischemia or infarction. EF 50-55% ( no improvement from 2009 Myoview EF of 28%   TEE WITHOUT CARDIOVERSION N/A 08/16/2014   Procedure: TRANSESOPHAGEAL ECHOCARDIOGRAM (TEE);  Surgeon: [name], MD;  Location: [facility] ORS;  Service: Cardiovascular;  Laterality: N/A;   TEE WITHOUT CARDIOVERSION N/A 03/05/2021   Procedure:  TRANSESOPHAGEAL ECHOCARDIOGRAM (TEE);  Surgeon: Kate Sable, MD;  Location: [facility] ORS;  Service: Cardiovascular;  Laterality: N/A;    Social History:  reports that he quit smoking about 24 years ago. His smoking use included cigarettes. He has a 12.00 pack-year smoking history. He has never used smokeless tobacco. He reports that he does not currently use alcohol. He reports that he does not use drugs.  Family History:  Family History  Problem Relation Age of Onset   Hypertension Mother    Hyperlipidemia Mother    Diabetes Mother      Prior to Admission medications   Medication Sig Start Date End Date Taking? Authorizing Provider  allopurinol (ZYLOPRIM) 100 MG tablet Take 0.5 tablets (50 mg total) by mouth daily. 10/03/21   Danford, Suann Larry, MD  amiodarone (PACERONE) 200 MG tablet Take 1 tablet (200 mg total) by mouth 2 (two) times daily. 02/02/22   Bensimhon, Shaune Pascal, MD  apixaban (ELIQUIS) 5 MG TABS tablet Take 1 tablet (5 mg total) by mouth 2 (two) times daily. 10/02/21   Danford, Suann Larry, MD  atorvastatin (LIPITOR) 80 MG tablet Take 1 tablet (80 mg total) by mouth daily. 11/24/21   Annita Brod, MD  carvedilol (COREG) 6.25 MG tablet  Take 1 tablet (6.25 mg total) by mouth 2 (two) times daily with a meal. 10/02/21   Danford, Suann Larry, MD  dapagliflozin propanediol (FARXIGA) 10 MG TABS tablet Take 1 tablet (10 mg total) by mouth daily before breakfast. 10/02/21   Danford, Suann Larry, MD  ferrous gluconate (FERGON) 324 MG tablet Take 324 mg by mouth daily with breakfast. 12/22/21   [provider]  levothyroxine (SYNTHROID) 100 MCG tablet Take 1 tablet (100 mcg total) by mouth daily before breakfast. 11/24/21   Annita Brod, MD  metolazone (ZAROXOLYN) 2.5 MG tablet Take 1 tablet (2.5 mg total) by mouth daily. Patient taking differently: Take 2.5 mg by mouth once a week. On Fridays 02/02/22   Bensimhon, Shaune Pascal, MD  potassium chloride SA (KLOR-CON M)  20 MEQ tablet Take 1 tablet (20 mEq total) by mouth daily. Take an extra 2 tablets on Mon and Fri with metolazone. Patient taking differently: Take 20 mEq by mouth daily. Take an extra 2 tablets on Fri with metolazone. 02/02/22   Bensimhon, Shaune Pascal, MD  torsemide (DEMADEX) 20 MG tablet Take 3 tablets (60 mg total) by mouth 2 (two) times daily. 01/12/22   Lorella Nimrod, MD    Physical Exam: Vitals:   02/23/22 0913 02/23/22 0915 02/23/22 1322  BP: 108/85  110/80  Pulse: (!) 57  60  Resp: 18  18  Temp: 97.8 F (36.6 C)  98 F (36.7 C)  TempSrc: Oral  Oral  SpO2: 96%  96%  Weight:  108 kg   Height:  5\' 11"  (1.803 m)    General: Not in acute distress.  Dry mucous membrane HEENT:       Eyes: PERRL, EOMI, no scleral icterus.       ENT: No discharge from the ears and nose, no pharynx injection, no tonsillar enlargement.        Neck: No JVD, no bruit, no mass felt. Heme: No neck lymph node enlargement. Cardiac: S1/S2, RRR, No murmurs, No gallops or rubs. Respiratory: No rales, wheezing, rhonchi or rubs. GI: Soft, nondistended, has tenderness in the left abdomen.  No rebound pain, no organomegaly, BS present. GU: No hematuria Ext: No pitting leg edema bilaterally. 1+DP/PT pulse bilaterally. Musculoskeletal: No joint deformities, No joint redness or warmth, no limitation of ROM in spin. Skin: No rashes.  Neuro: Alert, oriented X3, cranial nerves II-XII grossly intact, moves all extremities normally. Psych: Patient is not psychotic, no suicidal or hemocidal ideation.  Labs on Admission: I have personally reviewed following labs and imaging studies  CBC: Recent Labs  Lab 02/23/22 0920  WBC 6.9  HGB 19.8*  HCT 60.9*  MCV 80.1  PLT 676   Basic Metabolic Panel: Recent Labs  Lab 02/19/22 1515 02/23/22 0920  NA 134* 129*  K 3.5 3.7  CL 88* 82*  CO2 29 28  GLUCOSE 150* 210*  BUN 97* 110*  CREATININE 4.96* 5.26*  CALCIUM 9.4 9.6   GFR: Estimated Creatinine Clearance: 19.4  mL/min (A) (by C-G formula based on SCr of 5.26 mg/dL (H)). Liver Function Tests: Recent Labs  Lab 02/23/22 0920  AST 38  ALT 28  ALKPHOS 112  BILITOT 1.9*  PROT 8.5*  ALBUMIN 4.1   Recent Labs  Lab 02/23/22 0920  LIPASE 94*   No results for input(s): "AMMONIA" in the last 168 hours. Coagulation Profile: No results for input(s): "INR", "PROTIME" in the last 168 hours. Cardiac Enzymes: No results for input(s): "CKTOTAL", "CKMB", "CKMBINDEX", "TROPONINI" in the  last 168 hours. BNP (last 3 results) No results for input(s): "PROBNP" in the last 8760 hours. HbA1C: No results for input(s): "HGBA1C" in the last 72 hours. CBG: No results for input(s): "GLUCAP" in the last 168 hours. Lipid Profile: No results for input(s): "CHOL", "HDL", "LDLCALC", "TRIG", "CHOLHDL", "LDLDIRECT" in the last 72 hours. Thyroid Function Tests: No results for input(s): "TSH", "T4TOTAL", "FREET4", "T3FREE", "THYROIDAB" in the last 72 hours. Anemia Panel: No results for input(s): "VITAMINB12", "FOLATE", "FERRITIN", "TIBC", "IRON", "RETICCTPCT" in the last 72 hours. Urine analysis:    Component Value Date/Time   COLORURINE YELLOW (A) 02/23/2022 1106   APPEARANCEUR CLEAR (A) 02/23/2022 1106   APPEARANCEUR Clear 02/26/2014 1126   LABSPEC 1.009 02/23/2022 1106   LABSPEC 1.008 02/26/2014 1126   PHURINE 7.0 02/23/2022 1106   GLUCOSEU >=500 (A) 02/23/2022 1106   GLUCOSEU Negative 02/26/2014 1126   HGBUR SMALL (A) 02/23/2022 1106   BILIRUBINUR NEGATIVE 02/23/2022 1106   BILIRUBINUR Negative 02/26/2014 1126   KETONESUR NEGATIVE 02/23/2022 1106   PROTEINUR >=300 (A) 02/23/2022 1106   NITRITE NEGATIVE 02/23/2022 1106   LEUKOCYTESUR MODERATE (A) 02/23/2022 1106   LEUKOCYTESUR 1+ 02/26/2014 1126   Sepsis Labs: @LABRCNTIP (procalcitonin:4,lacticidven:4) ) Recent Results (from the past 240 hour(s))  Resp panel by RT-PCR (RSV, Flu A&B, Covid) Anterior Nasal Swab     Status: Abnormal   Collection Time:  02/23/22  9:20 AM   Specimen: Anterior Nasal Swab  Result Value Ref Range Status   SARS Coronavirus 2 by RT PCR NEGATIVE NEGATIVE Final    Comment: (NOTE) SARS-CoV-2 target nucleic acids are NOT DETECTED.  The SARS-CoV-2 RNA is generally detectable in upper respiratory specimens during the acute phase of infection. The lowest concentration of SARS-CoV-2 viral copies this assay can detect is 138 copies/mL. A negative result does not preclude SARS-Cov-2 infection and should not be used as the sole basis for treatment or other patient management decisions. A negative result may occur with  improper specimen collection/handling, submission of specimen other than nasopharyngeal swab, presence of viral mutation(s) within the areas targeted by this assay, and inadequate number of viral copies(<138 copies/mL). A negative result must be combined with clinical observations, patient history, and epidemiological information. The expected result is Negative.  Fact Sheet for Patients:  EntrepreneurPulse.com.au  Fact Sheet for Healthcare Providers:  IncredibleEmployment.be  This test is no t yet approved or cleared by the [place] FDA and  has been authorized for detection and/or diagnosis of SARS-CoV-2 by FDA under an Emergency Use Authorization (EUA). This EUA will remain  in effect (meaning this test can be used) for the duration of the COVID-19 declaration under Section 564(b)(1) of the Act, 21 U.S.C.section 360bbb-3(b)(1), unless the authorization is terminated  or revoked sooner.       Influenza A by PCR NEGATIVE NEGATIVE Final   Influenza B by PCR NEGATIVE NEGATIVE Final    Comment: (NOTE) The Xpert Xpress SARS-CoV-2/FLU/RSV plus assay is intended as an aid in the diagnosis of influenza from Nasopharyngeal swab specimens and should not be used as a sole basis for treatment. Nasal washings and aspirates are unacceptable for Xpert Xpress  SARS-CoV-2/FLU/RSV testing.  Fact Sheet for Patients: EntrepreneurPulse.com.au  Fact Sheet for Healthcare Providers: IncredibleEmployment.be  This test is not yet approved or cleared by the [place] FDA and has been authorized for detection and/or diagnosis of SARS-CoV-2 by FDA under an Emergency Use Authorization (EUA). This EUA will remain in effect (meaning this test can be used) for the  duration of the COVID-19 declaration under Section 564(b)(1) of the Act, 21 U.S.C. section 360bbb-3(b)(1), unless the authorization is terminated or revoked.     Resp Syncytial Virus by PCR POSITIVE (A) NEGATIVE Final    Comment: (NOTE) Fact Sheet for Patients: EntrepreneurPulse.com.au  Fact Sheet for Healthcare Providers: IncredibleEmployment.be  This test is not yet approved or cleared by the [place] FDA and has been authorized for detection and/or diagnosis of SARS-CoV-2 by FDA under an Emergency Use Authorization (EUA). This EUA will remain in effect (meaning this test can be used) for the duration of the COVID-19 declaration under Section 564(b)(1) of the Act, 21 U.S.C. section 360bbb-3(b)(1), unless the authorization is terminated or revoked.  Performed at [facility], [address] [place], [place] [address]      Radiological Exams on Admission: DG Chest 2 View  Result Date: 02/23/2022 CLINICAL DATA:  Shortness of breath.  Cold symptoms. EXAM: CHEST - 2 VIEW COMPARISON:  Radiograph 01/24/2022. Lung bases from abdominopelvic CT earlier today FINDINGS: Mild cardiomegaly. Unchanged mediastinal contours. Tortuous thoracic aorta. Mild diffuse peribronchial thickening. Question of subtle septal thickening in the bases. No confluent airspace disease. No pleural fluid. No pneumothorax. No acute osseous findings. IMPRESSION: Mild cardiomegaly. Mild diffuse peribronchial thickening. Question of  subtle septal thickening in the bases, can be seen with pulmonary edema. Electronically Signed   By: Keith Rake M.D.   On: 02/23/2022 12:24   CT ABDOMEN PELVIS WO CONTRAST  Result Date: 02/23/2022 CLINICAL DATA:  57 year old male with history of acute onset of nonlocalized abdominal pain. EXAM: CT ABDOMEN AND PELVIS WITHOUT CONTRAST TECHNIQUE: Multidetector CT imaging of the abdomen and pelvis was performed following the standard protocol without IV contrast. RADIATION DOSE REDUCTION: This exam was performed according to the departmental dose-optimization program which includes automated exposure control, adjustment of the mA and/or kV according to patient size and/or use of iterative reconstruction technique. COMPARISON:  CT of the abdomen and pelvis 10/14/2021. FINDINGS: Lower chest: Small calcified granuloma in the right lower lobe. Patchy areas of ground-glass attenuation noted in the lung bases bilaterally, nonspecific, but similar to prior study. Mild cardiomegaly. Hepatobiliary: No definite suspicious cystic or solid hepatic lesions are confidently identified on today's noncontrast CT examination. Tiny calcified gallstones lying dependently in the gallbladder. Gallbladder is mildly distended. Gallbladder wall does not appear thickened. No pericholecystic fluid or surrounding inflammatory changes. Pancreas: Mild diffuse fatty atrophy of the pancreas. No pancreatic mass. No pancreatic ductal dilatation. No pancreatic or peripancreatic fluid collections or inflammatory changes. Spleen: Unremarkable. Adrenals/Urinary Tract: Multiple low-attenuation renal lesions bilaterally, incompletely characterized on today's noncontrast CT examination, but statistically likely to represent cysts, measuring up to 4.5 cm in diameter in the lateral aspect of the lower pole of the right kidney. In addition, in the posterior aspect of the interpolar region of the left kidney there are 2 high attenuation lesions, largest  of which measures 2.1 cm (axial image 29 of series 2). No hydroureteronephrosis. Urinary bladder is normal in appearance. Bilateral adrenal glands are normal in appearance. Stomach/Bowel: Unenhanced appearance of the stomach is normal. No pathologic dilatation of small bowel or colon. Numerous colonic diverticuli are noted, without surrounding inflammatory changes to suggest an acute diverticulitis at this time. Normal appendix. Large volume of well-formed stool in the rectal vault, which could suggest mild constipation. Vascular/Lymphatic: Atherosclerotic calcifications in the abdominal aorta and pelvic vasculature. No lymphadenopathy noted in the abdomen or pelvis. Reproductive: Prostate gland and seminal vesicles are unremarkable in appearance.  Other: Small umbilical hernia containing only omental fat. No significant volume of ascites. No pneumoperitoneum. Musculoskeletal: There are no aggressive appearing lytic or blastic lesions noted in the visualized portions of the skeleton. IMPRESSION: 1. No definite acute findings are noted in the abdomen or pelvis. 2. There is a large volume of well-formed stool in the rectal vault, which could suggest constipation. 3. Small umbilical hernia containing only omental fat. No associated bowel incarceration or obstruction at this time. 4. Cholelithiasis without evidence of acute cholecystitis. 5. Colonic diverticulosis without evidence of acute diverticulitis at this time. 6. Aortic atherosclerosis. 7. Additional incidental findings, as above. Electronically Signed   By: Vinnie Langton M.D.   On: 02/23/2022 12:18      Assessment/Plan Principal Problem:   Acute renal failure superimposed on stage 4 chronic kidney disease (HCC) Active Problems:   RSV infection   Abdominal pain   Hyponatremia   Essential hypertension   Paroxysmal atrial fibrillation (HCC)   Chronic systolic CHF (congestive heart failure) (HCC)   Hypothyroidism   Stroke (HCC)   HLD  (hyperlipidemia)   UTI (urinary tract infection)   Obesity (BMI 30-39.9)   Type II diabetes mellitus with renal manifestations (HCC)   Assessment and Plan:  Acute renal failure superimposed on stage 4 chronic kidney disease ([place]): Likely due to multifactorial etiology, including possible UTI, dehydration, and continuation of diuretics.  No hydronephrosis by CT scan.  Dr. Holley Raring of renal was consulted.  -Admit to telemetry bed as inpatient -Gentle IV fluid: 40 cc/h of normal saline -Hold diuretics -Avoid using renal toxic medications. -Monitor renal function closely  RSV infection: No oxygen desaturation. -Albuterol and Mucinex -supportive care  Abdominal pain: Possibly due to constipation.  CT scan of abdomen/pelvis is negative for acute intra-abdominal issues.  Patient has mildly elevated lipase at 94, but no signs of pancreatitis by CT scan. -MiraLAX and senokot for constipation -As needed Zofran  Hyponatremia: Sodium 129.  Mental status normal.  Likely due to poor oral intake and dehydration, and continuation of diuretics use - Will check urine sodium, urine osmolality, serum osmolality. - IVF: 40 mL/h of normal saline - Will not start sodium chloride tablet since patient is at high risk for developing fluid retention in the setting of EF <20%  - f/u by BMP q8h - avoid over correction too fast due to risk of central pontine myelinolysis - Hold diuretics  Essential hypertension: -IV hydralazine as needed -Coreg,  Paroxysmal atrial fibrillation ([place]): Heart rate is 60. -Continue Eliquis, Coreg and amiodarone  Chronic systolic CHF (congestive heart failure) ([place]): 2D echo on 12/08/2021 showed EF<20%.  BNP 669, but patient is clinically dry. -Diuretics due to worsening renal function and hyponatremia  Hypothyroidism -Synthroid  Stroke [facility]) -Patient is on Eliquis for A-fib -Lipitor  HLD (hyperlipidemia) -Lipitor  UTI (urinary tract infection) -Rocephin -Follow-up  urine culture  Obesity (BMI 30-39.9): BMI 33.19, body weight 108 kg -Exercise and healthy diet -Encourage losing weight   Type II diabetes mellitus with renal manifestation: Recent A1c 6.8.  Blood sugar 210 today.  Patient's not taking medications (supposed to take [place]) -SSI     DVT ppx: on  Eliquis  Code Status: Full code  Family Communication: I offered to call his family, but patient states that his brother knows what is going on for him, I do need to call his family per patient.  Disposition Plan:  Anticipate discharge back to previous environment  Consults called:   Dr. Holley Raring of renal is  consulted.   Admission status and Level of care: Telemetry Medical:   as inpt      Dispo: The patient is from: Home              Anticipated d/c is to: Home              Anticipated d/c date is: 2 days              Patient currently is not medically stable to d/c.    Severity of Illness:  The appropriate patient status for this patient is INPATIENT. Inpatient status is judged to be reasonable and necessary in order to provide the required intensity of service to ensure the patient's safety. The patient's presenting symptoms, physical exam findings, and initial radiographic and laboratory data in the context of their chronic comorbidities is felt to place them at high risk for further clinical deterioration. Furthermore, it is not anticipated that the patient will be medically stable for discharge from the hospital within 2 midnights of admission.   * I certify that at the point of admission it is my clinical judgment that the patient will require inpatient hospital care spanning beyond 2 midnights from the point of admission due to high intensity of service, high risk for further deterioration and high frequency of surveillance required.*       Date of Service 02/23/2022    Ivor Costa [facility]   If 7PM-7AM, please contact night-coverage [email or web address] 02/23/2022,  5:06 PM

## 2022-02-23 NOTE — ED Triage Notes (Signed)
Pt in via EMS from home with c/o NV and not feeling well for 3 days. Pt concerned there is fluid on his lungs.

## 2022-02-23 NOTE — ED Triage Notes (Addendum)
Patient states he has left side abdominal pain and vomited last night after supper. Patient also c/o cold symptoms.

## 2022-02-23 NOTE — ED Provider Notes (Signed)
[facility] Provider Note    Event Date/Time   First MD Initiated Contact with Patient 02/23/22 1039     (approximate)   History   Abdominal Pain   HPI  Hector [name] is a 57 y.o. male here with abdominal pain, cough, general fatigue.  The patient states his primary issue is abdominal swelling and pain with some bilateral flank pain.  This seems to have gotten worse over the last day or 2.  He also states that he has had a cough which is worsened and he said some orthopnea and shortness of breath with exertion.  He has had some increasing lower extremity edema.  He states he feels generally fatigued.  He also states that he recently had his metolazone decreased as his creatinine had gone out.  He states he is continue to feel worse despite that.  Denies any fevers.  Denies any leg pain.     Physical Exam   Triage Vital Signs: ED Triage Vitals  Enc Vitals Group     BP 02/23/22 0913 108/85     Pulse Rate 02/23/22 0913 (!) 57     Resp 02/23/22 0913 18     Temp 02/23/22 0913 97.8 F (36.6 C)     Temp Source 02/23/22 0913 Oral     SpO2 02/23/22 0913 96 %     Weight 02/23/22 0915 238 lb (108 kg)     Height 02/23/22 0915 5\' 11"  (1.803 m)     Head Circumference --      Peak Flow --      Pain Score 02/23/22 0915 7     Pain Loc --      Pain Edu? --      Excl. in [place]? --     Most recent vital signs: Vitals:   02/23/22 0913 02/23/22 1322  BP: 108/85 110/80  Pulse: (!) 57 60  Resp: 18 18  Temp: 97.8 F (36.6 C) 98 F (36.7 C)  SpO2: 96% 96%     General: Awake, no distress.  CV:  Good peripheral perfusion. RRR. Resp:  Mild tachypnea noted.  Diminished breath sounds diffusely.  Rales bibasilar lung fields. Abd:  Mild distention, no tenderness.  No rebound or guarding. Other:  1+ edema bilaterally.   ED Results / Procedures / Treatments   Labs (all labs ordered are listed, but only abnormal results are displayed) Labs Reviewed  LIPASE,  BLOOD - Abnormal; Notable for the following components:      Result Value   Lipase 94 (*)    All other components within normal limits  COMPREHENSIVE METABOLIC PANEL - Abnormal; Notable for the following components:   Sodium 129 (*)    Chloride 82 (*)    Glucose, Bld 210 (*)    BUN 110 (*)    Creatinine, Ser 5.26 (*)    Total Protein 8.5 (*)    Total Bilirubin 1.9 (*)    GFR, Estimated 12 (*)    Anion gap 19 (*)    All other components within normal limits  CBC - Abnormal; Notable for the following components:   RBC 7.60 (*)    Hemoglobin 19.8 (*)    HCT 60.9 (*)    RDW 19.2 (*)    All other components within normal limits  URINALYSIS, ROUTINE W REFLEX MICROSCOPIC - Abnormal; Notable for the following components:   Color, Urine YELLOW (*)    APPearance CLEAR (*)    Glucose, UA >=500 (*)  Hgb urine dipstick SMALL (*)    Protein, ur >=300 (*)    Leukocytes,Ua MODERATE (*)    All other components within normal limits  BRAIN NATRIURETIC PEPTIDE - Abnormal; Notable for the following components:   B Natriuretic Peptide 609.5 (*)    All other components within normal limits  RESP PANEL BY RT-PCR (RSV, FLU A&B, COVID)  RVPGX2     EKG AFib, VR  109.  QRS 122, QTc 455.  No acute ST elevations or depressions.  Nonspecific ST changes, largely unchanged from prior.  LVH noted.   RADIOLOGY Chest x-ray: Mild cardiomegaly, diffuse peribronchial thickening, possible mild edema CT abdomen/pelvis: Large volume of well-formed stool in the rectal vault, likely constipation, otherwise no acute surgical abnormality   I also independently reviewed and agree with radiologist interpretations.   PROCEDURES:  Critical Care performed: No   MEDICATIONS ORDERED IN ED: Medications  0.9 %  sodium chloride infusion (has no administration in time range)     IMPRESSION / MDM / ASSESSMENT AND PLAN / ED COURSE  I reviewed the triage vital signs and the nursing notes.                               Differential diagnosis includes, but is not limited to, constipation, ileus, ascites/anasarca, fluid overload causing gastric distress, diverticulitis, colitis, enteritis.  Shortness of breath secondary to viral URI, pneumonia, CHF, less likely ACS or PE.  Patient's presentation is most consistent with acute presentation with potential threat to life or bodily function.  The patient is on the cardiac monitor to evaluate for evidence of arrhythmia and/or significant heart rate changes.  57 year old male with fairly extensive past medical history including CHF, CKD, here with multiple complaints.  Regarding his generalized weakness, I suspect this is multifactorial in the setting of worsening renal failure, possible actual mild volume depletion.  Differential includes viral URI, influenza.  CT of the abdomen pelvis obtained, reviewed by me, and shows no acute abnormality.  He does appear constipated which could be contributing.  Chest x-ray shows possible mild pulmonary edema.  Patient lab work actually looks like hemoconcentration with acute on chronic kidney injury.  His hemoglobin is 19.8.  BUN is 110 with a creatinine of 5.26 which continues to increase fairly significantly and acutely.  Lipase 94.  Urinalysis shows pyuria and hematuria but he has no urinary symptoms, do not suspect UTI clinically.  Discussed the case with Dr. Holley Raring of nephrology, will admit for IV hydration and monitoring.  Patient agreement with this plan at this time. He recommends 40 cc/hr hydration and trending labs. Hold lasix/metolazone.   FINAL CLINICAL IMPRESSION(S) / ED DIAGNOSES   Final diagnoses:  Acute renal failure superimposed on chronic kidney disease, unspecified acute renal failure type, unspecified CKD stage (HCC)  Dehydration  Constipation, unspecified constipation type     Rx / DC Orders   ED Discharge Orders     None        Note:  This document was prepared using Dragon voice recognition  software and may include unintentional dictation errors.   Duffy Bruce, MD 02/23/22 [phone]

## 2022-02-23 NOTE — ED Notes (Signed)
Pt refused cbg.  No insulin given

## 2022-02-23 NOTE — ED Notes (Signed)
Pt would not allow this tech to complete CBG check. Pt stated he is not diabetic and no one is pricking his finger.

## 2022-02-24 DIAGNOSIS — B338 Other specified viral diseases: Secondary | ICD-10-CM | POA: Diagnosis not present

## 2022-02-24 LAB — BASIC METABOLIC PANEL
Anion gap: 16 — ABNORMAL HIGH (ref 5–15)
Anion gap: 18 — ABNORMAL HIGH (ref 5–15)
Anion gap: 17 — ABNORMAL HIGH (ref 5–15)
BUN: 106 mg/dL — ABNORMAL HIGH (ref 6–20)
BUN: 91 mg/dL — ABNORMAL HIGH (ref 6–20)
BUN: 104 mg/dL — ABNORMAL HIGH (ref 6–20)
CO2: 29 mmol/L (ref 22–32)
CO2: 26 mmol/L (ref 22–32)
CO2: 26 mmol/L (ref 22–32)
Calcium: 8.4 mg/dL — ABNORMAL LOW (ref 8.9–10.3)
Calcium: 8.6 mg/dL — ABNORMAL LOW (ref 8.9–10.3)
Calcium: 8.2 mg/dL — ABNORMAL LOW (ref 8.9–10.3)
Chloride: 87 mmol/L — ABNORMAL LOW (ref 98–111)
Chloride: 83 mmol/L — ABNORMAL LOW (ref 98–111)
Chloride: 85 mmol/L — ABNORMAL LOW (ref 98–111)
Creatinine, Ser: 4.58 mg/dL — ABNORMAL HIGH (ref 0.61–1.24)
Creatinine, Ser: 4.72 mg/dL — ABNORMAL HIGH (ref 0.61–1.24)
Creatinine, Ser: 4.76 mg/dL — ABNORMAL HIGH (ref 0.61–1.24)
GFR, Estimated: 14 mL/min — ABNORMAL LOW (ref >60)
GFR, Estimated: 14 mL/min — ABNORMAL LOW (ref >60)
GFR, Estimated: 13 mL/min — ABNORMAL LOW (ref >60)
Glucose, Bld: 150 mg/dL — ABNORMAL HIGH (ref 70–99)
Glucose, Bld: 118 mg/dL — ABNORMAL HIGH (ref 70–99)
Glucose, Bld: 191 mg/dL — ABNORMAL HIGH (ref 70–99)
Potassium: 3 mmol/L — ABNORMAL LOW (ref 3.5–5.1)
Potassium: 3.5 mmol/L (ref 3.5–5.1)
Potassium: 3.3 mmol/L — ABNORMAL LOW (ref 3.5–5.1)
Sodium: 132 mmol/L — ABNORMAL LOW (ref 135–145)
Sodium: 127 mmol/L — ABNORMAL LOW (ref 135–145)
Sodium: 128 mmol/L — ABNORMAL LOW (ref 135–145)

## 2022-02-24 NOTE — ED Notes (Signed)
Pt refused CBG. Pt states "I am not a diabetic".

## 2022-02-24 NOTE — Progress Notes (Addendum)
Progress Note   Patient: Hector [name] FAO:[id number] DOB: 1965-06-24 DOA: 02/23/2022     1 DOS: the patient was seen and examined on 02/24/2022   Brief hospital course: Hector [name] is a 57 y.o. male with medical history significant of  A. fib on Eliquis, pancreatitis, sCHF with EF < 20%, CKD-IV, HTN, HLD, stroke, hypothyroidism, alcohol abuse in remission, GI bleeding, OSA, anemia, COVID infection 06/19/2020, who presents with nausea, vomiting, abdominal pain, generalized weakness.  Workup revealed acute on chronic renal failure and evidence of constipation.  Assessment and Plan: Principal Problem:   Acute renal failure superimposed on stage 4 chronic kidney disease (HCC) Active Problems:   RSV infection   Abdominal pain   Hyponatremia   Essential hypertension   Paroxysmal atrial fibrillation (HCC)   Chronic systolic CHF (congestive heart failure) (HCC)   Hypothyroidism   Stroke (HCC)   HLD (hyperlipidemia)   UTI (urinary tract infection)   Obesity (BMI 30-39.9)   Type II diabetes mellitus with renal manifestations (HCC)    Assessment and Plan:   Acute renal failure superimposed on stage 4 chronic kidney disease ([place]): Likely due to multifactorial etiology, including possible UTI, dehydration, and continuation of diuretics.  No hydronephrosis by CT scan.  Dr. Holley Raring of renal was consulted.  -Admit to telemetry bed as inpatient -Gentle IV fluid: 40 cc/h of normal saline -Hold diuretics -Avoid using renal toxic medications. -Monitor renal function closely, this morning his creatinine is slightly improved to 4.58   RSV infection: No oxygen desaturation. -Albuterol and Mucinex -supportive care   Abdominal pain: Possibly due to constipation, abdominal pain and nausea have resolved.  CT scan of abdomen/pelvis is negative for acute intra-abdominal issues.  Patient has mildly elevated lipase at 94, but no signs of pancreatitis by CT scan. -MiraLAX and senokot for  constipation -As needed Zofran   Hyponatremia: Sodium 129 on admission, sodium improved to 132.  Mental status normal.  Likely due to poor oral intake and dehydration, and continuation of diuretics use - Will check urine sodium, urine osmolality, serum osmolality. - IVF: 40 mL/h of normal saline - f/u by BMP q8h - avoid over correction too fast due to risk of central pontine myelinolysis - Hold diuretics as above   Essential hypertension: -IV hydralazine as needed -Coreg,   Paroxysmal atrial fibrillation ([place]): Heart rate is 60. -Continue Eliquis, Coreg and amiodarone   Chronic systolic CHF (congestive heart failure) ([place]): 2D echo on 12/08/2021 showed EF<20%.  BNP 669, but patient is clinically dry. -Hold diuretics due to worsening renal function and hyponatremia   Hypothyroidism -Synthroid   Stroke [facility]) -Patient is on Eliquis for A-fib -Lipitor   HLD (hyperlipidemia) -Lipitor   UTI (urinary tract infection) - not highly suspected  - will DC Rocephin for now -Follow-up urine culture   Obesity (BMI 30-39.9): BMI 33.19, body weight 108 kg -Exercise and healthy diet -Encourage losing weight    Type II diabetes mellitus with renal manifestation: Recent A1c 6.8.  Blood sugar controlled at the time of admission.  Patient's not taking medications (supposed to take [place]) -SSI    DVT ppx: on  Eliquis   Code Status: Full code   Disposition Plan:  Anticipate discharge back to previous environment   Consults called:   Dr. Holley Raring of renal is consulted.      Subjective: Seen this morning in the ER resting comfortably on the stretcher in room 36.  He has no complaints, says he has some very minimal  nausea, but no vomiting and no diarrhea.  Denies abdominal pain, chest pain or other complaints.  Says that he has had a bowel movement early this morning.  Physical Exam: Vitals:   02/23/22 1322 02/23/22 1800 02/24/22 0341 02/24/22 0648  BP: 110/80 126/85 111/85 (!) 118/102   Pulse: 60 65 99 99  Resp: 18 20 12 18   Temp: 98 F (36.7 C) 98.2 F (36.8 C) 98.5 F (36.9 C) 98.5 F (36.9 C)  TempSrc: Oral Oral Oral Oral  SpO2: 96% 98% 96% 98%  Weight:      Height:      General: Not in acute distress.  Dry mucous membrane HEENT:       Eyes: PERRL, EOMI, no scleral icterus.       ENT: No discharge from the ears and nose, no pharynx injection, no tonsillar enlargement.        Neck: No JVD, no bruit, no mass felt. Heme: No neck lymph node enlargement. Cardiac: S1/S2, RRR, No murmurs, No gallops or rubs. Respiratory: No rales, wheezing, rhonchi or rubs. GI: Soft, nondistended, abdomen is nontender this morning.  No rebound pain, no organomegaly, BS present. GU: No hematuria Ext: No pitting leg edema bilaterally. 1+DP/PT pulse bilaterally. Musculoskeletal: No joint deformities, No joint redness or warmth, no limitation of ROM in spin. Skin: No rashes.  Neuro: Alert, oriented X3, cranial nerves II-XII grossly intact, moves all extremities normally. Psych: Patient is not psychotic, no suicidal or hemocidal ideation.    Data Reviewed:  There are no new results to review at this time.  Time spent: 32 minutes  Author: Mikal Blasdell Marry Guan, MD 02/24/2022 8:15 AM  For on call review [email or web address].

## 2022-02-24 NOTE — TOC Initial Note (Signed)
Transition of Care [facility]) - Initial/Assessment Note    Patient Details  Name: Hector [name] MRN: [id number] Date of Birth: 1965-05-06  Transition of Care [facility]) CM/SW Contact:    Beverly Sessions, RN Phone Number: 02/24/2022, 3:53 PM  Clinical Narrative:                  Admitted for: AKI Patient with extreme risk for readmission Patient was assessed by [facility] 12/4 see note from that admission below "CSW completed readmit assessment. Pt states that he gets his medications from [facility] and he doesn't have any concerns with getting them. Pt's PCP is Dr. Rebeca Alert. He states that his brother takes him to his appointments. "    Transition of Care [facility]) Screening Note   Patient Details  Name: Hector [name] Date of Birth: 05/26/1965   Transition of Care [facility]) CM/SW Contact:    Beverly Sessions, RN Phone Number: 02/24/2022, 3:56 PM    Transition of Care Department [facility]) has reviewed patient and no TOC needs have been identified at this time. We will continue to monitor patient advancement through interdisciplinary progression rounds. If new patient transition needs arise, please place a TOC consult.       Patient Goals and CMS Choice            Expected Discharge Plan and Services                                              Prior Living Arrangements/Services                       Activities of Daily Living      Permission Sought/Granted                  Emotional Assessment              Admission diagnosis:  Dehydration [E86.0] Constipation, unspecified constipation type [K59.00] Acute renal failure superimposed on stage 4 chronic kidney disease, unspecified acute renal failure type ([place]) [N17.9, N18.4] Acute renal failure superimposed on chronic kidney disease, unspecified acute renal failure type, unspecified CKD stage ([place]) [N17.9, N18.9] Patient Active Problem List   Diagnosis Date Noted   Acute renal failure  superimposed on stage 4 chronic kidney disease ([place]) 02/23/2022   Hyponatremia 02/23/2022   Abdominal pain 02/23/2022   Acute renal failure superimposed on stage 4 chronic kidney disease, unspecified acute renal failure type ([place]) 02/23/2022   Type II diabetes mellitus with renal manifestations ([place]) 02/23/2022   RSV infection 02/23/2022   HLD (hyperlipidemia) 01/24/2022   Myocardial injury 01/24/2022   Acute on chronic combined systolic (congestive) and diastolic (congestive) heart failure ([place]) 01/06/2022   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 ([place]) 12/10/2021   Pulmonary nodule 12/07/2021   Acute on chronic congestive heart failure ([place]) 11/18/2021   Gout 11/18/2021   Atrial fibrillation with RVR ([place]) 10/13/2021   Obesity (BMI 30-39.9) 10/01/2021   Acute on chronic systolic (congestive) heart failure ([place]) 09/28/2021   Right upper quadrant pain 09/10/2021   Nausea and vomiting 09/10/2021   Atrial flutter with rapid ventricular response ([place]) 03/01/2021   Hypothyroidism 11/30/2020   Stroke ([place])    Alcohol abuse    Chronic renal impairment, stage 4 (severe) (HCC)    Chronic systolic CHF (congestive heart failure) ([place])  Iron deficiency anemia    CKD (chronic kidney disease) stage 4, GFR 15-29 ml/min (HCC) 07/21/2020   UTI (urinary tract infection) 01/30/2020   Acute renal failure superimposed on stage 3b chronic kidney disease ([place]) 12/31/2019   Acute respiratory failure with hypoxia ([place]) 12/22/2019    Class: Acute   Acute on chronic systolic CHF (congestive heart failure) ([place]) 07/21/2016   Obstructive sleep apnea 04/21/2016   Nonischemic cardiomyopathy ([place]) 05/26/2015   Chronic combined systolic (congestive) and diastolic (congestive) heart failure ([place]) 04/01/2015   Essential hypertension 04/01/2015   Nonischemic cardiomyopathy: EF 20-25% 09/21/2014   Paroxysmal atrial fibrillation ([place]) 08/16/2014   Paroxysmal atrial flutter ([place]) 08/14/2014    Class: Acute   Hypertensive  heart disease    Mixed hyperlipidemia    PCP:  [name], MD Pharmacy:   [facility] [address]), [place] [facility] [place])  [address] Phone: [phone] Fax: [place] [place] [address] Phone: [phone] Fax: [phone]     Social Determinants of Health (SDOH) Social History: SDOH Screenings   Food Insecurity: No Food Insecurity (01/07/2022)  Housing: Low Risk  (01/07/2022)  Transportation Needs: No Transportation Needs (01/07/2022)  Utilities: Not At Risk (01/07/2022)  Depression (PHQ2-9): Low Risk  (08/19/2021)  Financial Resource Strain: Low Risk  (01/04/2019)  Physical Activity: Inactive (01/04/2019)  Social Connections: Moderately Isolated (01/04/2019)  Stress: No Stress Concern Present (01/04/2019)  Tobacco Use: Medium Risk (02/23/2022)   SDOH Interventions:     Readmission Risk Interventions    02/24/2022    3:53 PM 12/09/2021    1:09 PM 10/14/2021    1:24 PM  Readmission Risk Prevention Plan  Transportation Screening Complete Complete Complete  PCP or Specialist Appt within 3-5 Days   Complete  Social Work Consult for [place] Planning/Counseling   Complete  Palliative Care Screening   Not Applicable  Medication Review Press photographer) Complete Complete Complete  PCP or Specialist appointment within 3-5 days of discharge  Complete   SW Recovery Care/Counseling Consult Complete Complete   Palliative Care Screening Not Applicable Not [place] Not Applicable Not Applicable

## 2022-02-25 ENCOUNTER — Other Ambulatory Visit: Payer: Medicaid Other

## 2022-02-25 DIAGNOSIS — B338 Other specified viral diseases: Secondary | ICD-10-CM | POA: Diagnosis not present

## 2022-02-25 LAB — CBC
HCT: 52.9 % — ABNORMAL HIGH (ref 39.0–52.0)
Hemoglobin: 16.9 g/dL (ref 13.0–17.0)
MCH: 25.8 pg — ABNORMAL LOW (ref 26.0–34.0)
MCHC: 31.9 g/dL (ref 30.0–36.0)
MCV: 80.6 fL (ref 80.0–100.0)
Platelets: 159 10*3/uL (ref 150–400)
RBC: 6.56 MIL/uL — ABNORMAL HIGH (ref 4.22–5.81)
RDW: 18.3 % — ABNORMAL HIGH (ref 11.5–15.5)
WBC: 6 10*3/uL (ref 4.0–10.5)
nRBC: 0 % (ref 0.0–0.2)

## 2022-02-25 LAB — BASIC METABOLIC PANEL
Anion gap: 19 — ABNORMAL HIGH (ref 5–15)
BUN: 111 mg/dL — ABNORMAL HIGH (ref 6–20)
CO2: 23 mmol/L (ref 22–32)
Calcium: 8.3 mg/dL — ABNORMAL LOW (ref 8.9–10.3)
Chloride: 88 mmol/L — ABNORMAL LOW (ref 98–111)
Creatinine, Ser: 4.54 mg/dL — ABNORMAL HIGH (ref 0.61–1.24)
GFR, Estimated: 14 mL/min — ABNORMAL LOW (ref >60)
Glucose, Bld: 142 mg/dL — ABNORMAL HIGH (ref 70–99)
Potassium: 3.4 mmol/L — ABNORMAL LOW (ref 3.5–5.1)
Sodium: 130 mmol/L — ABNORMAL LOW (ref 135–145)

## 2022-02-25 LAB — URINE CULTURE: Culture: <10000 — AB

## 2022-02-25 NOTE — Progress Notes (Signed)
Patient refuses staff to do CBG's. MD notified. No new orders at this time

## 2022-02-25 NOTE — Progress Notes (Signed)
BP 95/76. Order received from MD to hold a.m. dose of coreg

## 2022-02-25 NOTE — Progress Notes (Signed)
[facility]  ROUNDING NOTE   Subjective:   Hector [name] is a 57 y.o. male with past medical conditions including hypertension, paroxysmal A-fib on Eliquis, CHF with a EF less than 20%, and chronic kidney disease stage IV. Patient presents to the emergency department with nausea, vomiting and shortness of breath. He has been admitted for Dehydration [E86.0] Constipation, unspecified constipation type [K59.00] Acute renal failure superimposed on stage 4 chronic kidney disease, unspecified acute renal failure type ([place]) [N17.9, N18.4] Acute renal failure superimposed on chronic kidney disease, unspecified acute renal failure type, unspecified CKD stage ([place]) [N17.9, N18.9]  Patient is known to our practice from previous admissions and is currently followed by [facility] nephrology outpatient.  Patient states he does not feel well for 3 to 4 days.  Endorses nausea, vomiting, with some abdominal pain.  Reports diarrhea started since admission.  Does not report diarrhea prior to ED arrival.  Denies shortness of breath or chest pain.  No known fever or chills.  Does report a nonproductive cough.  Has continued to take all prescribed medications including diuretics.  Labs on ED arrival include sodium 129, glucose 210, BUN 110, creatinine 5.26 with GFR 12, BNP 609.5, and hemoglobin 19.8.  Respiratory panel positive for RSV.  UA appears clear with moderate leukocytes.    We have been consulted to evaluate acute kidney injury.  Objective:  Vital signs in last 24 hours:  Temp:  [98.1 F (36.7 C)-98.5 F (36.9 C)] 98.2 F (36.8 C) (01/03 0733) Pulse Rate:  [46-89] 89 (01/03 0733) Resp:  [17-20] 17 (01/03 0733) BP: (86-110)/(71-91) 95/76 (01/03 0733) SpO2:  [97 %-99 %] 98 % (01/03 0733) Weight:  [107.8 kg] 107.8 kg (01/03 0500)  Weight change: -0.156 kg Filed Weights   02/23/22 0915 02/25/22 0500  Weight: 108 kg 107.8 kg    Intake/Output: I/O last 3 completed shifts: In: 0  Out:  1950 [Urine:1950]   Intake/Output this shift:  Total I/O In: -  Out: 400 [Urine:400]  Physical Exam: General: NAD  Head: Normocephalic, atraumatic. Moist oral mucosal membranes  Eyes: Anicteric  Lungs:  Clear to auscultation, normal effort, room air  Heart: Regular rate and rhythm  Abdomen:  Soft, nontender  Extremities: 1+ peripheral edema.  Neurologic: Nonfocal, moving all four extremities  Skin: No lesions  Access: None    Basic Metabolic Panel: Recent Labs  Lab 02/23/22 0920 02/24/22 0331 02/24/22 1338 02/24/22 2012 02/25/22 0443  NA 129* 132* 127* 128* 130*  K 3.7 3.0* 3.5 3.3* 3.4*  CL 82* 87* 83* 85* 88*  CO2 28 29 26 26 23   GLUCOSE 210* 150* 118* 191* 142*  BUN 110* 106* 91* 104* 111*  CREATININE 5.26* 4.58* 4.72* 4.76* 4.54*  CALCIUM 9.6 8.4* 8.6* 8.2* 8.3*    Liver Function Tests: Recent Labs  Lab 02/23/22 0920  AST 38  ALT 28  ALKPHOS 112  BILITOT 1.9*  PROT 8.5*  ALBUMIN 4.1   Recent Labs  Lab 02/23/22 0920  LIPASE 94*   No results for input(s): "AMMONIA" in the last 168 hours.  CBC: Recent Labs  Lab 02/23/22 0920 02/25/22 0443  WBC 6.9 6.0  HGB 19.8* 16.9  HCT 60.9* 52.9*  MCV 80.1 80.6  PLT 220 159    Cardiac Enzymes: No results for input(s): "CKTOTAL", "CKMB", "CKMBINDEX", "TROPONINI" in the last 168 hours.  BNP: Invalid input(s): "POCBNP"  CBG: No results for input(s): "GLUCAP" in the last 168 hours.  Microbiology: Results for orders placed  or performed during the hospital encounter of 02/23/22  Resp panel by RT-PCR (RSV, Flu A&B, Covid) Anterior Nasal Swab     Status: Abnormal   Collection Time: 02/23/22  9:20 AM   Specimen: Anterior Nasal Swab  Result Value Ref Range Status   SARS Coronavirus 2 by RT PCR NEGATIVE NEGATIVE Final    Comment: (NOTE) SARS-CoV-2 target nucleic acids are NOT DETECTED.  The SARS-CoV-2 RNA is generally detectable in upper respiratory specimens during the acute phase of infection. The  lowest concentration of SARS-CoV-2 viral copies this assay can detect is 138 copies/mL. A negative result does not preclude SARS-Cov-2 infection and should not be used as the sole basis for treatment or other patient management decisions. A negative result may occur with  improper specimen collection/handling, submission of specimen other than nasopharyngeal swab, presence of viral mutation(s) within the areas targeted by this assay, and inadequate number of viral copies(<138 copies/mL). A negative result must be combined with clinical observations, patient history, and epidemiological information. The expected result is Negative.  Fact Sheet for Patients:  EntrepreneurPulse.com.au  Fact Sheet for Healthcare Providers:  IncredibleEmployment.be  This test is no t yet approved or cleared by the [place] FDA and  has been authorized for detection and/or diagnosis of SARS-CoV-2 by FDA under an Emergency Use Authorization (EUA). This EUA will remain  in effect (meaning this test can be used) for the duration of the COVID-19 declaration under Section 564(b)(1) of the Act, 21 U.S.C.section 360bbb-3(b)(1), unless the authorization is terminated  or revoked sooner.       Influenza A by PCR NEGATIVE NEGATIVE Final   Influenza B by PCR NEGATIVE NEGATIVE Final    Comment: (NOTE) The Xpert Xpress SARS-CoV-2/FLU/RSV plus assay is intended as an aid in the diagnosis of influenza from Nasopharyngeal swab specimens and should not be used as a sole basis for treatment. Nasal washings and aspirates are unacceptable for Xpert Xpress SARS-CoV-2/FLU/RSV testing.  Fact Sheet for Patients: EntrepreneurPulse.com.au  Fact Sheet for Healthcare Providers: IncredibleEmployment.be  This test is not yet approved or cleared by the [place] FDA and has been authorized for detection and/or diagnosis of SARS-CoV-2 by FDA under  an Emergency Use Authorization (EUA). This EUA will remain in effect (meaning this test can be used) for the duration of the COVID-19 declaration under Section 564(b)(1) of the Act, 21 U.S.C. section 360bbb-3(b)(1), unless the authorization is terminated or revoked.     Resp Syncytial Virus by PCR POSITIVE (A) NEGATIVE Final    Comment: (NOTE) Fact Sheet for Patients: EntrepreneurPulse.com.au  Fact Sheet for Healthcare Providers: IncredibleEmployment.be  This test is not yet approved or cleared by the [place] FDA and has been authorized for detection and/or diagnosis of SARS-CoV-2 by FDA under an Emergency Use Authorization (EUA). This EUA will remain in effect (meaning this test can be used) for the duration of the COVID-19 declaration under Section 564(b)(1) of the Act, 21 U.S.C. section 360bbb-3(b)(1), unless the authorization is terminated or revoked.  Performed at [facility], [address], [place], [place] [address]   Urine Culture     Status: Abnormal   Collection Time: 02/23/22 11:08 AM   Specimen: Urine, Clean Catch  Result Value Ref Range Status   Specimen Description   Final    URINE, CLEAN CATCH Performed at [facility], [address] [place], [place] [address]    Special Requests   Final    NONE Performed at [facility], [place]  [address] [place], [place] [address]    Culture (A)  Final    <10,000 COLONIES/mL INSIGNIFICANT GROWTH Performed at [place] [address], [place], [place] [address]    Report Status 02/25/2022 FINAL  Final    Coagulation Studies: No results for input(s): "LABPROT", "INR" in the last 72 hours.  Urinalysis: Recent Labs    02/23/22 1106  COLORURINE YELLOW*  LABSPEC 1.009  PHURINE 7.0  GLUCOSEU >=500*  HGBUR SMALL*  BILIRUBINUR NEGATIVE  KETONESUR NEGATIVE  PROTEINUR >=300*  NITRITE NEGATIVE  LEUKOCYTESUR MODERATE*       Imaging: No results found.   Medications:    sodium chloride 40 mL/hr at 02/24/22 1416    allopurinol  50 mg Oral Daily   amiodarone  200 mg Oral BID   apixaban  5 mg Oral BID   atorvastatin  80 mg Oral Daily   carvedilol  6.25 mg Oral BID WC   ferrous gluconate  324 mg Oral Q breakfast   insulin aspart  0-5 Units Subcutaneous QHS   insulin aspart  0-9 Units Subcutaneous TID WC   levothyroxine  100 mcg Oral QAC breakfast   senna-docusate  1 tablet Oral BID   acetaminophen, albuterol, dextromethorphan-guaiFENesin, hydrALAZINE, ondansetron (ZOFRAN) IV, polyethylene glycol  Assessment/ Plan:  [name] is a 57 y.o.  male with past medical conditions including hypertension, paroxysmal A-fib on Eliquis, CHF with a EF less than 20%, and chronic kidney disease stage IV. Patient presents to the emergency department with shortness of breath and increased lower extremity edema.   Acute Kidney Injury with hyponatremia on chronic kidney disease stage 4 with baseline creatinine 2.63 and GFR of 28 on 11/18/21.  Acute kidney injury secondary to hypovolemia.  And Chronic kidney disease is secondary to hypertension.  CT abdomen pelvis 4.5 cm cyst on the right lower pole, no obstruction.  Agree with holding metolazone and torsemide for now.  Sodium 129, fluctuating during this admission within range of 127-132.  Will continue to monitor.  No acute need for dialysis at this time.  Agree with gentle IV hydration.  Continue to avoid nephrotoxic agents and therapies, avoid hypotension.   Lab Results  Component Value Date   CREATININE 4.54 (H) 02/25/2022   CREATININE 4.76 (H) 02/24/2022   CREATININE 4.72 (H) 02/24/2022    Intake/Output Summary (Last 24 hours) at 02/25/2022 1349 Last data filed at 02/25/2022 0917 Gross per 24 hour  Intake 0 ml  Output 850 ml  Net -850 ml   2. Anemia of chronic kidney disease Lab Results  Component Value Date   HGB 16.9 02/25/2022    Hemoglobin  remains above desired range.  No need for ESA's at this time  3. Chronic systolic and diastolic heart failure.  Echo from 12/08/2021 shows EF less than 20% with a moderate to severe mitral valve regurgitation.  Diuretics currently held.  4. 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 Home regimen includes Wilder Glade, held. Most recent hemoglobin A1c is 7.2 on 11/19/21.  Diet controlled.    LOS: 2 [place] 1/3/20241:49 PM

## 2022-02-25 NOTE — Progress Notes (Signed)
Patient is refusing all CBG's

## 2022-02-25 NOTE — Progress Notes (Signed)
Progress Note  Patient: Hector [name] DOB: 09-24-1965 DOA: 02/23/2022     2 DOS: the patient was seen and examined on 02/25/2022   Brief hospital course: Hector [name] is a 57 y.o. male with medical history significant of  A. fib on Eliquis, pancreatitis, sCHF with EF < 20%, CKD-IV, HTN, HLD, stroke, hypothyroidism, alcohol abuse in remission, GI bleeding, OSA, anemia, COVID infection 06/19/2020, who presents with nausea, vomiting, abdominal pain, generalized weakness.  Workup revealed acute on chronic renal failure and evidence of constipation.  Assessment and Plan: Principal Problem:   Acute renal failure superimposed on stage 4 chronic kidney disease (HCC) Active Problems:   RSV infection   Abdominal pain   Hyponatremia   Essential hypertension   Paroxysmal atrial fibrillation (HCC)   Chronic systolic CHF (congestive heart failure) (HCC)   Hypothyroidism   Stroke (HCC)   HLD (hyperlipidemia)   UTI (urinary tract infection)   Obesity (BMI 30-39.9)   Type II diabetes mellitus with renal manifestations (HCC)    Assessment and Plan:   Acute renal failure superimposed on stage 4 chronic kidney disease ([place]): Likely due to multifactorial etiology, including possible UTI, dehydration, and continuation of diuretics.  No hydronephrosis by CT scan.  Dr. Holley Raring of renal was consulted and will see today. -Admit to telemetry bed as inpatient -Gentle IV fluid: 40 cc/h of normal saline -Hold diuretics -Avoid using renal toxic medications. -Monitor renal function closely, this morning his creatinine is very slightly improved   RSV infection: No oxygen desaturation. -Albuterol and Mucinex -supportive care   Abdominal pain: Possibly due to constipation, abdominal pain and nausea have resolved.  CT scan of abdomen/pelvis is negative for acute intra-abdominal issues.  Patient has mildly elevated lipase at 94, but no signs of pancreatitis by CT scan. He is having BMs  now. -MiraLAX and senokot for constipation -As needed Zofran   Hyponatremia: Sodium 129 on admission, sodium improved to 132 now 128 this AM.  Mental status normal.  Likely due to poor oral intake and dehydration, and continuation of diuretics use - Will check urine sodium, urine osmolality, serum osmolality. - IVF: 40 mL/h of normal saline - f/u by BMP q8h - Hold diuretics as above await further eval from Renal   Essential hypertension: -IV hydralazine as needed -Coreg,   Paroxysmal atrial fibrillation ([place]): Heart rate is 60. -Continue Eliquis, Coreg and amiodarone   Chronic systolic CHF (congestive heart failure) ([place]): 2D echo on 12/08/2021 showed EF<20%.  BNP 669, but patient is clinically dry. 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  Blood sugar controlled at the time of admission.  Patient's not taking medications (supposed to take [place]) -SSI    DVT ppx: on  Eliquis   Code Status: Full code   Disposition Plan:  Anticipate discharge back to previous environment   Consults called:   Nephrology      Subjective: Seen this morning doing well have no complaints.   Physical Exam: Vitals:   02/25/22 0420 02/25/22 0500 02/25/22 0525 02/25/22 0733  BP: (!) 86/71  110/79 95/76  Pulse: (!) 46  (!) 52 89  Resp: 18   17  Temp: 98.4 F (36.9 C)   98.2 F (36.8 C)  TempSrc:      SpO2: 98%  99% 98%  Weight:  107.8 kg    Height:      General: Not in acute distress.  Moist mucous membranes HEENT:       Eyes: PERRL, EOMI, no scleral  icterus.       ENT: No discharge from the ears and nose, no pharynx injection, no tonsillar enlargement.        Neck: No JVD, no bruit, no mass felt. Heme: No neck lymph node enlargement. Cardiac: S1/S2, RRR, No murmurs, No gallops or rubs. Respiratory: No rales, wheezing, rhonchi or rubs. GI: Soft, nondistended, abdomen is nontender this morning.  No rebound pain, no organomegaly, BS present. GU: No hematuria Ext: No pitting leg edema bilaterally. 1+DP/PT pulse bilaterally. Musculoskeletal: No joint deformities, No joint redness or warmth, no limitation of ROM in spin. Skin: No rashes.  Neuro: Alert, oriented X3, cranial nerves II-XII grossly intact, moves all extremities normally. Psych: Patient is not psychotic, no suicidal or hemocidal ideation.   Data Reviewed: CBC, BMP, Cr improving  Time spent: 32 minutes  Author: Ottilia Pippenger Marry Guan, MD 02/25/2022 10:33 AM  For on call review [email or web address].

## 2022-02-26 DIAGNOSIS — B338 Other specified viral diseases: Secondary | ICD-10-CM | POA: Diagnosis not present

## 2022-02-26 LAB — BASIC METABOLIC PANEL
Anion gap: 15 (ref 5–15)
BUN: 96 mg/dL — ABNORMAL HIGH (ref 6–20)
CO2: 27 mmol/L (ref 22–32)
Calcium: 8.1 mg/dL — ABNORMAL LOW (ref 8.9–10.3)
Chloride: 89 mmol/L — ABNORMAL LOW (ref 98–111)
Creatinine, Ser: 4.29 mg/dL — ABNORMAL HIGH (ref 0.61–1.24)
GFR, Estimated: 15 mL/min — ABNORMAL LOW (ref >60)
Glucose, Bld: 133 mg/dL — ABNORMAL HIGH (ref 70–99)
Potassium: 3.1 mmol/L — ABNORMAL LOW (ref 3.5–5.1)
Sodium: 131 mmol/L — ABNORMAL LOW (ref 135–145)

## 2022-02-26 LAB — CBC
HCT: 48.3 % (ref 39.0–52.0)
Hemoglobin: 15.8 g/dL (ref 13.0–17.0)
MCH: 26.3 pg (ref 26.0–34.0)
MCHC: 32.7 g/dL (ref 30.0–36.0)
MCV: 80.4 fL (ref 80.0–100.0)
Platelets: 145 10*3/uL — ABNORMAL LOW (ref 150–400)
RBC: 6.01 MIL/uL — ABNORMAL HIGH (ref 4.22–5.81)
RDW: 17.5 % — ABNORMAL HIGH (ref 11.5–15.5)
WBC: 5.7 10*3/uL (ref 4.0–10.5)
nRBC: 0 % (ref 0.0–0.2)

## 2022-02-26 MED ORDER — POTASSIUM CHLORIDE CRYS ER 20 MEQ PO TBCR
20.0000 meq | EXTENDED_RELEASE_TABLET | ORAL | Status: AC
  Administered 2022-02-26 (×2): 20 meq via ORAL
  Filled 2022-02-26 (×2): qty 1

## 2022-02-26 NOTE — Progress Notes (Signed)
Progress Note  Patient: Hector [name]:[id number] DOB: 1965-09-23 DOA: 02/23/2022     3 DOS: the patient was seen and examined on 02/26/2022   Brief hospital course: ADONIJAH BAENA is a 57 y.o. male with medical history significant of  A. fib on Eliquis, pancreatitis, sCHF with EF < 20%, CKD-IV, HTN, HLD, stroke, hypothyroidism, alcohol abuse in remission, GI bleeding, OSA, anemia, COVID infection 06/19/2020, who presents with nausea, vomiting, abdominal pain, generalized weakness.  Workup revealed acute on chronic renal failure and evidence of constipation.  Assessment and Plan: Principal Problem:   Acute renal failure superimposed on stage 4 chronic kidney disease (HCC) Active Problems:   RSV infection   Abdominal pain   Hyponatremia   Essential hypertension   Paroxysmal atrial fibrillation (HCC)   Chronic systolic CHF (congestive heart failure) (HCC)   Hypothyroidism   Stroke (HCC)   HLD (hyperlipidemia)   UTI (urinary tract infection)   Obesity (BMI 30-39.9)   Type II diabetes mellitus with renal manifestations (HCC)    Assessment and Plan:   Acute renal failure superimposed on stage 4 chronic kidney disease ([place]): Likely due to multifactorial etiology, including possible UTI, dehydration, and continuation of diuretics.  No hydronephrosis by CT scan.  Dr. Holley Raring of renal following. -Admit to telemetry bed as inpatient -Gentle IV fluid: 40 cc/h of normal saline -Hold diuretics -Avoid using renal toxic medications. -Monitor renal function closely, improving slowly Lab Results  Component Value Date   CREATININE 4.29 (H) 02/26/2022   CREATININE 4.54 (H) 02/25/2022   CREATININE 4.76 (H) 02/24/2022    RSV infection: No oxygen desaturation. -Albuterol and Mucinex -supportive care   Abdominal pain: Possibly due to constipation, abdominal pain and nausea have resolved.  CT scan of abdomen/pelvis is negative for acute intra-abdominal issues.  Patient has mildly elevated  lipase at 94, but no signs of pancreatitis by CT scan. He is having BMs now. -MiraLAX and senokot for constipation -As needed Zofran   Hyponatremia: Asymptomatic, relatively stable.  Likely due to poor oral intake and dehydration, and continuation of diuretics use - IVF: 40 mL/h of normal saline   Essential hypertension: -IV hydralazine as needed -Coreg,   Paroxysmal atrial fibrillation ([place]): Heart rate is 60. -Continue Eliquis, Coreg and amiodarone   Chronic systolic CHF (congestive heart failure) ([place]): 2D echo on 12/08/2021 showed EF<20%.  BNP 669, but patient is clinically dry. 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  Blood sugar controlled at the time of admission.  Patient's not taking medications (supposed to take [place]) -SSI    DVT ppx: on  Eliquis   Code Status: Full code   Disposition Plan:  Anticipate discharge back to previous environment   Consults called:   Nephrology     Subjective: Seen this morning doing well has no complaints.   Physical Exam: Vitals:   02/25/22 2008 02/26/22 0500 02/26/22 0612 02/26/22 0832  BP: 104/78  112/64 110/88  Pulse: 67  73 79  Resp: 18  20 18   Temp: 98 F (36.7 C)  99.1 F (37.3 C) (!)  97.5 F (36.4 C)  TempSrc: Oral  Oral   SpO2: 95%  97% 97%  Weight:  107.6 kg    Height:      General: Not in acute distress.  Moist mucous membranes HEENT:       Eyes: PERRL, EOMI, no scleral icterus.       ENT: No discharge from the ears and nose, no pharynx injection, no tonsillar enlargement.         Neck: No JVD, no bruit, no mass felt. Heme: No neck lymph node enlargement. Cardiac: S1/S2, RRR, No murmurs, No gallops or rubs. Respiratory: No rales, wheezing, rhonchi or rubs. GI: Soft, nondistended, abdomen is nontender this morning.  No rebound pain, no organomegaly, BS present. GU: No hematuria Ext: No pitting leg edema bilaterally. 1+DP/PT pulse bilaterally. Musculoskeletal: No joint deformities, No joint redness or warmth, no limitation of ROM in spin. Skin: No rashes.  Neuro: Alert, oriented X3, cranial nerves II-XII grossly intact, moves all extremities normally. Psych: Patient is not psychotic, no suicidal or hemocidal ideation.   Data Reviewed: CBC, BMP, Cr improving  Time spent: 32 minutes  Author: Desirai Traxler Marry Guan, MD 02/26/2022 11:26 AM  For on call review [email or web address].

## 2022-02-26 NOTE — Progress Notes (Addendum)
[facility]  ROUNDING NOTE   Subjective:   Hector [name] is a 57 y.o. male with past medical conditions including hypertension, paroxysmal A-fib on Eliquis, CHF with a EF less than 20%, and chronic kidney disease stage IV. Patient presents to the emergency department with nausea, vomiting and shortness of breath. He has been admitted for Dehydration [E86.0] Constipation, unspecified constipation type [K59.00] Acute renal failure superimposed on stage 4 chronic kidney disease, unspecified acute renal failure type ([place]) [N17.9, N18.4] Acute renal failure superimposed on chronic kidney disease, unspecified acute renal failure type, unspecified CKD stage ([place]) [N17.9, N18.9]  Patient is known to our practice from previous admissions and is currently followed by [facility] nephrology outpatient.    Patient seen resting in bed, alert and oriented Reports decreased stool occurrences    Objective:  Vital signs in last 24 hours:  Temp:  [97.5 F (36.4 C)-99.1 F (37.3 C)] 97.5 F (36.4 C) (01/04 0832) Pulse Rate:  [67-89] 79 (01/04 0832) Resp:  [17-20] 18 (01/04 0832) BP: (104-113)/(64-96) 110/88 (01/04 0832) SpO2:  [95 %-97 %] 97 % (01/04 0832) Weight:  [107.6 kg] 107.6 kg (01/04 0500)  Weight change: -0.2 kg Filed Weights   02/23/22 0915 02/25/22 0500 02/26/22 0500  Weight: 108 kg 107.8 kg 107.6 kg    Intake/Output: I/O last 3 completed shifts: In: 2166.2 [P.O.:240; I.V.:1926.2] Out: 2550 [Urine:2550]   Intake/Output this shift:  Total I/O In: -  Out: 350 [Urine:350]  Physical Exam: General: NAD  Head: Normocephalic, atraumatic. Moist oral mucosal membranes  Eyes: Anicteric  Lungs:  Clear to auscultation, normal effort, room air  Heart: Regular rate and rhythm  Abdomen:  Soft, nontender  Extremities: 1+ peripheral edema.  Neurologic: Nonfocal, moving all four extremities  Skin: No lesions  Access: None    Basic Metabolic Panel: Recent Labs  Lab  02/24/22 0331 02/24/22 1338 02/24/22 2012 02/25/22 0443 02/26/22 0329  NA 132* 127* 128* 130* 131*  K 3.0* 3.5 3.3* 3.4* 3.1*  CL 87* 83* 85* 88* 89*  CO2 29 26 26 23 27   GLUCOSE 150* 118* 191* 142* 133*  BUN 106* 91* 104* 111* 96*  CREATININE 4.58* 4.72* 4.76* 4.54* 4.29*  CALCIUM 8.4* 8.6* 8.2* 8.3* 8.1*     Liver Function Tests: Recent Labs  Lab 02/23/22 0920  AST 38  ALT 28  ALKPHOS 112  BILITOT 1.9*  PROT 8.5*  ALBUMIN 4.1    Recent Labs  Lab 02/23/22 0920  LIPASE 94*    No results for input(s): "AMMONIA" in the last 168 hours.  CBC: Recent Labs  Lab 02/23/22 0920 02/25/22 0443 02/26/22 0329  WBC 6.9 6.0 5.7  HGB 19.8* 16.9 15.8  HCT 60.9* 52.9* 48.3  MCV 80.1 80.6 80.4  PLT 220 159 145*     Cardiac Enzymes: No results for input(s): "CKTOTAL", "CKMB", "CKMBINDEX", "TROPONINI" in the last 168 hours.  BNP: Invalid input(s): "POCBNP"  CBG: No results for input(s): "GLUCAP" in the last 168 hours.  Microbiology: Results for orders placed or performed during the hospital encounter of 02/23/22  Resp panel by RT-PCR (RSV, Flu A&B, Covid) Anterior Nasal Swab     Status: Abnormal   Collection Time: 02/23/22  9:20 AM   Specimen: Anterior Nasal Swab  Result Value Ref Range Status   SARS Coronavirus 2 by RT PCR NEGATIVE NEGATIVE Final    Comment: (NOTE) SARS-CoV-2 target nucleic acids are NOT DETECTED.  The SARS-CoV-2 RNA is generally detectable in upper respiratory specimens  during the acute phase of infection. The lowest concentration of SARS-CoV-2 viral copies this assay can detect is 138 copies/mL. A negative result does not preclude SARS-Cov-2 infection and should not be used as the sole basis for treatment or other patient management decisions. A negative result may occur with  improper specimen collection/handling, submission of specimen other than nasopharyngeal swab, presence of viral mutation(s) within the areas targeted by this assay,  and inadequate number of viral copies(<138 copies/mL). A negative result must be combined with clinical observations, patient history, and epidemiological information. The expected result is Negative.  Fact Sheet for Patients:  EntrepreneurPulse.com.au  Fact Sheet for Healthcare Providers:  IncredibleEmployment.be  This test is no t yet approved or cleared by the [place] FDA and  has been authorized for detection and/or diagnosis of SARS-CoV-2 by FDA under an Emergency Use Authorization (EUA). This EUA will remain  in effect (meaning this test can be used) for the duration of the COVID-19 declaration under Section 564(b)(1) of the Act, 21 U.S.C.section 360bbb-3(b)(1), unless the authorization is terminated  or revoked sooner.       Influenza A by PCR NEGATIVE NEGATIVE Final   Influenza B by PCR NEGATIVE NEGATIVE Final    Comment: (NOTE) The Xpert Xpress SARS-CoV-2/FLU/RSV plus assay is intended as an aid in the diagnosis of influenza from Nasopharyngeal swab specimens and should not be used as a sole basis for treatment. Nasal washings and aspirates are unacceptable for Xpert Xpress SARS-CoV-2/FLU/RSV testing.  Fact Sheet for Patients: EntrepreneurPulse.com.au  Fact Sheet for Healthcare Providers: IncredibleEmployment.be  This test is not yet approved or cleared by the [place] FDA and has been authorized for detection and/or diagnosis of SARS-CoV-2 by FDA under an Emergency Use Authorization (EUA). This EUA will remain in effect (meaning this test can be used) for the duration of the COVID-19 declaration under Section 564(b)(1) of the Act, 21 U.S.C. section 360bbb-3(b)(1), unless the authorization is terminated or revoked.     Resp Syncytial Virus by PCR POSITIVE (A) NEGATIVE Final    Comment: (NOTE) Fact Sheet for Patients: EntrepreneurPulse.com.au  Fact Sheet for  Healthcare Providers: IncredibleEmployment.be  This test is not yet approved or cleared by the [place] FDA and has been authorized for detection and/or diagnosis of SARS-CoV-2 by FDA under an Emergency Use Authorization (EUA). This EUA will remain in effect (meaning this test can be used) for the duration of the COVID-19 declaration under Section 564(b)(1) of the Act, 21 U.S.C. section 360bbb-3(b)(1), unless the authorization is terminated or revoked.  Performed at [facility], [address] [place], [place] [address]   Urine Culture     Status: Abnormal   Collection Time: 02/23/22 11:08 AM   Specimen: Urine, Clean Catch  Result Value Ref Range Status   Specimen Description   Final    URINE, CLEAN CATCH Performed at [facility], [address], [place], [place] [address]    Special Requests   Final    NONE Performed at [facility], [address], [place], [place] [address]    Culture (A)  Final    <10,000 COLONIES/mL INSIGNIFICANT GROWTH Performed at [facility] [address] [place], [place] [address]    Report Status 02/25/2022 FINAL  Final    Coagulation Studies: No results for input(s): "LABPROT", "INR" in the last 72 hours.  Urinalysis: No results for input(s): "COLORURINE", "LABSPEC", "PHURINE", "GLUCOSEU", "HGBUR", "BILIRUBINUR", "KETONESUR", "PROTEINUR", "UROBILINOGEN", "NITRITE", "LEUKOCYTESUR" in the last 72 hours.  Invalid input(s): "APPERANCEUR"     Imaging: No results found.   Medications:    sodium chloride 40 mL/hr at 02/25/22 1803    allopurinol  50 mg Oral Daily   amiodarone  200 mg Oral BID   apixaban  5 mg Oral BID   atorvastatin  80 mg Oral Daily   carvedilol  6.25 mg Oral BID WC   ferrous gluconate  324 mg Oral Q breakfast   insulin aspart  0-5 Units Subcutaneous QHS   insulin aspart  0-9 Units Subcutaneous TID WC   levothyroxine  100 mcg Oral QAC breakfast    potassium chloride  20 mEq Oral Q2H   senna-docusate  1 tablet Oral BID   acetaminophen, albuterol, dextromethorphan-guaiFENesin, hydrALAZINE, ondansetron (ZOFRAN) IV, polyethylene glycol  Assessment/ Plan:  Hector [name] is a 57 y.o.  male with past medical conditions including hypertension, paroxysmal A-fib on Eliquis, CHF with a EF less than 20%, and chronic kidney disease stage IV. Patient presents to the emergency department with shortness of breath and increased lower extremity edema.   Acute Kidney Injury with hyponatremia on chronic kidney disease stage 4 with baseline creatinine 2.63 and GFR of 28 on 11/18/21.  Acute kidney injury secondary to hypovolemia.  And Chronic kidney disease is secondary to hypertension.  CT abdomen pelvis 4.5 cm cyst on the right lower pole, no obstruction.  Continue to hold diuretics. Renal function has improved some with gentle hydration. Continue to avoid nephrotoxic agents and therapies. No acute need for dialysis.     Lab Results  Component Value Date   CREATININE 4.29 (H) 02/26/2022   CREATININE 4.54 (H) 02/25/2022   CREATININE 4.76 (H) 02/24/2022    Intake/Output Summary (Last 24 hours) at 02/26/2022 1217 Last data filed at 02/26/2022 1039 Gross per 24 hour  Intake 2166.2 ml  Output 2050 ml  Net 116.2 ml    2. Anemia of chronic kidney disease Lab Results  Component Value Date   HGB 15.8 02/26/2022    Hemoglobin above desired range.  No need for ESA's at this time  3. Chronic systolic and diastolic heart failure.  Echo from 12/08/2021 shows EF less than 20% with a moderate to severe mitral valve regurgitation.  Diuretics held.  4. 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 Home regimen includes Wilder Glade, held. Most recent hemoglobin A1c is 7.2 on 11/19/21.  Diet controlled.   5. Hypokalemia, likely due to kidney injury. Potassium 3.1, will order potassium chloride 62mEq, every 2 hours for 2 doses.     LOS: [place] 1/4/202412:17 PM

## 2022-02-26 NOTE — Plan of Care (Signed)

## 2022-02-27 DIAGNOSIS — B338 Other specified viral diseases: Secondary | ICD-10-CM | POA: Diagnosis not present

## 2022-02-27 LAB — CBC
HCT: 45.6 % (ref 39.0–52.0)
Hemoglobin: 14.8 g/dL (ref 13.0–17.0)
MCH: 26.8 pg (ref 26.0–34.0)
MCHC: 32.5 g/dL (ref 30.0–36.0)
MCV: 82.5 fL (ref 80.0–100.0)
Platelets: 132 10*3/uL — ABNORMAL LOW (ref 150–400)
RBC: 5.53 MIL/uL (ref 4.22–5.81)
RDW: 17.3 % — ABNORMAL HIGH (ref 11.5–15.5)
WBC: 5.5 10*3/uL (ref 4.0–10.5)
nRBC: 0 % (ref 0.0–0.2)

## 2022-02-27 LAB — BASIC METABOLIC PANEL
Anion gap: 8 (ref 5–15)
BUN: 81 mg/dL — ABNORMAL HIGH (ref 6–20)
CO2: 25 mmol/L (ref 22–32)
Calcium: 8.1 mg/dL — ABNORMAL LOW (ref 8.9–10.3)
Chloride: 98 mmol/L (ref 98–111)
Creatinine, Ser: 3.62 mg/dL — ABNORMAL HIGH (ref 0.61–1.24)
GFR, Estimated: 19 mL/min — ABNORMAL LOW (ref >60)
Glucose, Bld: 159 mg/dL — ABNORMAL HIGH (ref 70–99)
Potassium: 3.7 mmol/L (ref 3.5–5.1)
Sodium: 131 mmol/L — ABNORMAL LOW (ref 135–145)

## 2022-02-27 MED ORDER — POTASSIUM CHLORIDE CRYS ER 20 MEQ PO TBCR
20.0000 meq | EXTENDED_RELEASE_TABLET | ORAL | Status: AC
  Administered 2022-02-27 (×2): 20 meq via ORAL
  Filled 2022-02-27 (×2): qty 1

## 2022-02-27 MED ORDER — FLUTICASONE PROPIONATE 50 MCG/ACT NA SUSP
2.0000 | Freq: Every day | NASAL | Status: DC
  Administered 2022-02-27 – 2022-03-04 (×5): 2 via NASAL
  Filled 2022-02-27: qty 16

## 2022-02-27 NOTE — Progress Notes (Signed)
Progress Note  Patient: Hector [name] XBM:[id number] DOB: 07-Nov-1965 DOA: 02/23/2022     4 DOS: the patient was seen and examined on 02/27/2022   Brief hospital course: Hector [name] is a 57 y.o. male with medical history significant of  A. fib on Eliquis, pancreatitis, sCHF with EF < 20%, CKD-IV, HTN, HLD, stroke, hypothyroidism, alcohol abuse in remission, GI bleeding, OSA, anemia, COVID infection 06/19/2020, who presents with nausea, vomiting, abdominal pain, generalized weakness.  Workup revealed acute on chronic renal failure and evidence of constipation.  Assessment and Plan: Principal Problem:   Acute renal failure superimposed on stage 4 chronic kidney disease (HCC) Active Problems:   RSV infection   Abdominal pain   Hyponatremia   Essential hypertension   Paroxysmal atrial fibrillation (HCC)   Chronic systolic CHF (congestive heart failure) (HCC)   Hypothyroidism   Stroke (HCC)   HLD (hyperlipidemia)   UTI (urinary tract infection)   Obesity (BMI 30-39.9)   Type II diabetes mellitus with renal manifestations (HCC)    Assessment and Plan:   Acute renal failure superimposed on stage 4 chronic kidney disease ([place]): Likely due to multifactorial etiology, including possible UTI, dehydration, and continuation of diuretics.  No hydronephrosis by CT scan.  Dr. Holley Raring of renal following. -Admit to telemetry bed as inpatient -Gentle IV fluid: 40 cc/h of normal saline -Hold diuretics -Avoid using renal toxic medications. -Monitor renal function closely, improving slowly Lab Results  Component Value Date   CREATININE 3.62 (H) 02/27/2022   CREATININE 4.29 (H) 02/26/2022   CREATININE 4.54 (H) 02/25/2022    RSV infection: No oxygen desaturation. -Albuterol and Mucinex -supportive care   Abdominal pain: Possibly due to constipation, abdominal pain and nausea have resolved.  CT scan of abdomen/pelvis is negative for acute intra-abdominal issues.  Patient has mildly elevated  lipase at 94, but no signs of pancreatitis by CT scan. He is having BMs now. -MiraLAX and senokot for constipation -As needed Zofran   Hyponatremia: Asymptomatic, relatively stable.  Likely due to poor oral intake and dehydration, and continuation of diuretics use - IVF: 40 mL/h of normal saline   Essential hypertension: -IV hydralazine as needed -Coreg,   Paroxysmal atrial fibrillation ([place]): Heart rate is 60. -Continue Eliquis, Coreg and amiodarone   Chronic systolic CHF (congestive heart failure) ([place]): 2D echo on 12/08/2021 showed EF<20%.  BNP 669, but patient is clinically dry. 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  Blood sugar controlled at the time of admission.  Patient's not taking medications (supposed to take [place]) -SSI    DVT ppx: on  Eliquis   Code Status: Full code   Disposition Plan:  Anticipate discharge back to previous environment   Consults called:   Nephrology     Subjective: Seen this morning doing well has no complaints other than some nasal congestion.  Physical Exam: Vitals:   02/26/22 1904 02/26/22 1905 02/27/22 0500 02/27/22 0640  BP: 94/79 101/88  (!) 119/94  Pulse: 75 63  81  Resp: 16 16  18   Temp: 97.9 F (36.6 C)  97.9 F (36.6 C)  98.2 F (36.8 C)  TempSrc:      SpO2: 98% 95%  100%  Weight:   108.4 kg   Height:      General:  Alert, oriented, calm, in no acute distress, lying in bed  Eyes: EOMI, clear conjuctivae, white sclerea Neck: supple, no masses, trachea mildline  Cardiovascular: RRR, no  murmurs or rubs, no peripheral edema  Respiratory: clear to auscultation bilaterally, no wheezes, no crackles  Abdomen: soft, nontender, nondistended, normal bowel tones heard  Skin: dry, no rashes  Musculoskeletal: no joint effusions, normal range of motion  Psychiatric: appropriate affect, normal speech  Neurologic: extraocular muscles intact, clear speech, moving all extremities with intact sensorium     Data Reviewed: CBC, BMP, Cr improving  Time spent: 32 minutes  Author: Bryce Cheever Marry Guan, MD 02/27/2022 9:09 AM  For on call review [email or web address].

## 2022-02-27 NOTE — Plan of Care (Signed)
  Problem: Fluid Volume: Goal: Ability to maintain a balanced intake and output will improve Outcome: Progressing   Problem: Coping: Goal: Ability to adjust to condition or change in health will improve Outcome: Progressing   Problem: Health Behavior/Discharge Planning: Goal: Ability to identify and utilize available resources and services will improve Outcome: Progressing

## 2022-02-27 NOTE — TOC Progression Note (Signed)
Transition of Care [facility]) - Progression Note    Patient Details  Name: Hector [name] MRN: [id number] Date of Birth: [birth year]  Transition of Care [facility]) CM/SW Contact  Beverly Sessions, RN Phone Number: 02/27/2022, 8:57 AM  Clinical Narrative:      Transition of Care [facility]) Screening Note   Patient Details  Name: Hector [name] Date of Birth: 1965-06-08   Transition of Care [facility]) CM/SW Contact:    Beverly Sessions, RN Phone Number: 02/27/2022, 8:57 AM    Transition of Care Department [facility]) has reviewed patient and no TOC needs have been identified at this time. We will continue to monitor patient advancement through interdisciplinary progression rounds. If new patient transition needs arise, please place a TOC consult.         Expected Discharge Plan and Services                                               Social Determinants of Health (SDOH) Interventions SDOH Screenings   Food Insecurity: No Food Insecurity (02/25/2022)  Housing: Low Risk  (02/25/2022)  Transportation Needs: No Transportation Needs (02/25/2022)  Utilities: Not At Risk (02/25/2022)  Depression (PHQ2-9): Low Risk  (08/19/2021)  Financial Resource Strain: Low Risk  (01/04/2019)  Physical Activity: Inactive (01/04/2019)  Social Connections: Moderately Isolated (01/04/2019)  Stress: No Stress Concern Present (01/04/2019)  Tobacco Use: Medium Risk (02/23/2022)    Readmission Risk Interventions    02/24/2022    3:53 PM 12/09/2021    1:09 PM 10/14/2021    1:24 PM  Readmission Risk Prevention Plan  Transportation Screening Complete Complete Complete  PCP or Specialist Appt within 3-5 Days   Complete  Social Work Consult for [place] Planning/Counseling   Complete  Palliative Care Screening   Not Applicable  Medication Review Press photographer) Complete Complete Complete  PCP or Specialist appointment within 3-5 days of discharge  Complete   SW Recovery Care/Counseling Consult  Complete Complete   Palliative Care Screening Not Applicable Not [place] Not Applicable Not Applicable

## 2022-02-27 NOTE — Progress Notes (Signed)
[facility]  ROUNDING NOTE   Subjective:   Hector [name] is a 57 y.o. male with past medical conditions including hypertension, paroxysmal A-fib on Eliquis, CHF with a EF less than 20%, and chronic kidney disease stage IV. Patient presents to the emergency department with nausea, vomiting and shortness of breath. He has been admitted for Dehydration [E86.0] Constipation, unspecified constipation type [K59.00] Acute renal failure superimposed on stage 4 chronic kidney disease, unspecified acute renal failure type ([place]) [N17.9, N18.4] Acute renal failure superimposed on chronic kidney disease, unspecified acute renal failure type, unspecified CKD stage ([place]) [N17.9, N18.9]  Patient is known to our practice from previous admissions and is currently followed by [facility] nephrology outpatient.    Patient sitting at bedside States he feels well today Tolerating meals without nausea and vomiting   Objective:  Vital signs in last 24 hours:  Temp:  [97.6 F (36.4 C)-98.2 F (36.8 C)] 97.6 F (36.4 C) (01/05 0913) Pulse Rate:  [63-81] 76 (01/05 0913) Resp:  [16-20] 20 (01/05 0913) BP: (94-127)/(66-94) 127/84 (01/05 0913) SpO2:  [95 %-100 %] 95 % (01/05 0913) Weight:  [108.4 kg] 108.4 kg (01/05 0500)  Weight change: 0.8 kg Filed Weights   02/25/22 0500 02/26/22 0500 02/27/22 0500  Weight: 107.8 kg 107.6 kg 108.4 kg    Intake/Output: I/O last 3 completed shifts: In: 2156.9 [P.O.:800; I.V.:1356.9] Out: 3900 [Urine:3900]   Intake/Output this shift:  Total I/O In: -  Out: 400 [Urine:400]  Physical Exam: General: NAD  Head: Normocephalic, atraumatic. Moist oral mucosal membranes  Eyes: Anicteric  Lungs:  Clear to auscultation, normal effort, room air  Heart: Regular rate and rhythm  Abdomen:  Soft, nontender  Extremities: 1+ peripheral edema.  Neurologic: Nonfocal, moving all four extremities  Skin: No lesions  Access: None    Basic Metabolic Panel: Recent  Labs  Lab 02/24/22 1338 02/24/22 2012 02/25/22 0443 02/26/22 0329 02/27/22 0621  NA 127* 128* 130* 131* 131*  K 3.5 3.3* 3.4* 3.1* 3.7  CL 83* 85* 88* 89* 98  CO2 26 26 23 27 25   GLUCOSE 118* 191* 142* 133* 159*  BUN 91* 104* 111* 96* 81*  CREATININE 4.72* 4.76* 4.54* 4.29* 3.62*  CALCIUM 8.6* 8.2* 8.3* 8.1* 8.1*     Liver Function Tests: Recent Labs  Lab 02/23/22 0920  AST 38  ALT 28  ALKPHOS 112  BILITOT 1.9*  PROT 8.5*  ALBUMIN 4.1    Recent Labs  Lab 02/23/22 0920  LIPASE 94*    No results for input(s): "AMMONIA" in the last 168 hours.  CBC: Recent Labs  Lab 02/23/22 0920 02/25/22 0443 02/26/22 0329 02/27/22 0621  WBC 6.9 6.0 5.7 5.5  HGB 19.8* 16.9 15.8 14.8  HCT 60.9* 52.9* 48.3 45.6  MCV 80.1 80.6 80.4 82.5  PLT 220 159 145* 132*     Cardiac Enzymes: No results for input(s): "CKTOTAL", "CKMB", "CKMBINDEX", "TROPONINI" in the last 168 hours.  BNP: Invalid input(s): "POCBNP"  CBG: No results for input(s): "GLUCAP" in the last 168 hours.  Microbiology: Results for orders placed or performed during the hospital encounter of 02/23/22  Resp panel by RT-PCR (RSV, Flu A&B, Covid) Anterior Nasal Swab     Status: Abnormal   Collection Time: 02/23/22  9:20 AM   Specimen: Anterior Nasal Swab  Result Value Ref Range Status   SARS Coronavirus 2 by RT PCR NEGATIVE NEGATIVE Final    Comment: (NOTE) SARS-CoV-2 target nucleic acids are NOT DETECTED.  The  SARS-CoV-2 RNA is generally detectable in upper respiratory specimens during the acute phase of infection. The lowest concentration of SARS-CoV-2 viral copies this assay can detect is 138 copies/mL. A negative result does not preclude SARS-Cov-2 infection and should not be used as the sole basis for treatment or other patient management decisions. A negative result may occur with  improper specimen collection/handling, submission of specimen other than nasopharyngeal swab, presence of viral  mutation(s) within the areas targeted by this assay, and inadequate number of viral copies(<138 copies/mL). A negative result must be combined with clinical observations, patient history, and epidemiological information. The expected result is Negative.  Fact Sheet for Patients:  EntrepreneurPulse.com.au  Fact Sheet for Healthcare Providers:  IncredibleEmployment.be  This test is no t yet approved or cleared by the [place] FDA and  has been authorized for detection and/or diagnosis of SARS-CoV-2 by FDA under an Emergency Use Authorization (EUA). This EUA will remain  in effect (meaning this test can be used) for the duration of the COVID-19 declaration under Section 564(b)(1) of the Act, 21 U.S.C.section 360bbb-3(b)(1), unless the authorization is terminated  or revoked sooner.       Influenza A by PCR NEGATIVE NEGATIVE Final   Influenza B by PCR NEGATIVE NEGATIVE Final    Comment: (NOTE) The Xpert Xpress SARS-CoV-2/FLU/RSV plus assay is intended as an aid in the diagnosis of influenza from Nasopharyngeal swab specimens and should not be used as a sole basis for treatment. Nasal washings and aspirates are unacceptable for Xpert Xpress SARS-CoV-2/FLU/RSV testing.  Fact Sheet for Patients: EntrepreneurPulse.com.au  Fact Sheet for Healthcare Providers: IncredibleEmployment.be  This test is not yet approved or cleared by the [place] FDA and has been authorized for detection and/or diagnosis of SARS-CoV-2 by FDA under an Emergency Use Authorization (EUA). This EUA will remain in effect (meaning this test can be used) for the duration of the COVID-19 declaration under Section 564(b)(1) of the Act, 21 U.S.C. section 360bbb-3(b)(1), unless the authorization is terminated or revoked.     Resp Syncytial Virus by PCR POSITIVE (A) NEGATIVE Final    Comment: (NOTE) Fact Sheet for  Patients: EntrepreneurPulse.com.au  Fact Sheet for Healthcare Providers: IncredibleEmployment.be  This test is not yet approved or cleared by the [place] FDA and has been authorized for detection and/or diagnosis of SARS-CoV-2 by FDA under an Emergency Use Authorization (EUA). This EUA will remain in effect (meaning this test can be used) for the duration of the COVID-19 declaration under Section 564(b)(1) of the Act, 21 U.S.C. section 360bbb-3(b)(1), unless the authorization is terminated or revoked.  Performed at [facility], [address] [place], [place] [address]   Urine Culture     Status: Abnormal   Collection Time: 02/23/22 11:08 AM   Specimen: Urine, Clean Catch  Result Value Ref Range Status   Specimen Description   Final    URINE, CLEAN CATCH Performed at [facility], [address] [place], [place] [address]    Special Requests   Final    NONE Performed at [facility], [address] [place], [place] [address]    Culture (A)  Final    <10,000 COLONIES/mL INSIGNIFICANT GROWTH Performed at [facility] [address] [place], [place] [address]    Report Status 02/25/2022 FINAL  Final    Coagulation Studies: No results for input(s): "LABPROT", "INR" in the last 72 hours.  Urinalysis: No results for input(s): "COLORURINE", "LABSPEC", "PHURINE", "GLUCOSEU", "HGBUR", "BILIRUBINUR", "KETONESUR", "  PROTEINUR", "UROBILINOGEN", "NITRITE", "LEUKOCYTESUR" in the last 72 hours.  Invalid input(s): "APPERANCEUR"     Imaging: No results found.   Medications:    sodium chloride 40 mL/hr at 02/27/22 1421    allopurinol  50 mg Oral Daily   amiodarone  200 mg Oral BID   apixaban  5 mg Oral BID   atorvastatin  80 mg Oral Daily   carvedilol  6.25 mg Oral BID WC   ferrous gluconate  324 mg Oral Q breakfast   fluticasone  2 spray Each Nare Daily   insulin aspart  0-5 Units  Subcutaneous QHS   insulin aspart  0-9 Units Subcutaneous TID WC   levothyroxine  100 mcg Oral QAC breakfast   senna-docusate  1 tablet Oral BID   acetaminophen, albuterol, dextromethorphan-guaiFENesin, hydrALAZINE, ondansetron (ZOFRAN) IV, polyethylene glycol  Assessment/ Plan:  [name] is a 57 y.o.  male with past medical conditions including hypertension, paroxysmal A-fib on Eliquis, CHF with a EF less than 20%, and chronic kidney disease stage IV. Patient presents to the emergency department with shortness of breath and increased lower extremity edema.   Acute Kidney Injury with hyponatremia on chronic kidney disease stage 4 with baseline creatinine 2.63 and GFR of 28 on 11/18/21.  Acute kidney injury secondary to hypovolemia.  And Chronic kidney disease is secondary to hypertension.  CT abdomen pelvis 4.5 cm cyst on the right lower pole, no obstruction.  Continue to hold diuretics. Renal function has improved some with gentle hydration. Continue to avoid nephrotoxic agents and therapies. No acute need for dialysis.     Lab Results  Component Value Date   CREATININE 3.62 (H) 02/27/2022   CREATININE 4.29 (H) 02/26/2022   CREATININE 4.54 (H) 02/25/2022    Intake/Output Summary (Last 24 hours) at 02/27/2022 1430 Last data filed at 02/27/2022 1004 Gross per 24 hour  Intake 1756.88 ml  Output 2300 ml  Net -543.12 ml    2. Anemia of chronic kidney disease Lab Results  Component Value Date   HGB 14.8 02/27/2022    No need for ESA's at this time, hemoglobin acceptable  3. Chronic systolic and diastolic heart failure.  Echo from 12/08/2021 shows EF less than 20% with a moderate to severe mitral valve regurgitation.  Diuretics held.  4. 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 Home regimen includes Wilder Glade, held. Most recent hemoglobin A1c is 7.2 on 11/19/21.    5. Hypokalemia, likely due to kidney injury. Potassium 3.7, corrected  with oral supplementation. Will order another 46meq of oral potassium .    LOS: [place] 1/5/20242:30 PM

## 2022-02-28 DIAGNOSIS — B338 Other specified viral diseases: Secondary | ICD-10-CM | POA: Diagnosis not present

## 2022-02-28 LAB — CBC
HCT: 44.5 % (ref 39.0–52.0)
Hemoglobin: 14 g/dL (ref 13.0–17.0)
MCH: 26.4 pg (ref 26.0–34.0)
MCHC: 31.5 g/dL (ref 30.0–36.0)
MCV: 84 fL (ref 80.0–100.0)
Platelets: 135 10*3/uL — ABNORMAL LOW (ref 150–400)
RBC: 5.3 MIL/uL (ref 4.22–5.81)
RDW: 17.3 % — ABNORMAL HIGH (ref 11.5–15.5)
WBC: 6 10*3/uL (ref 4.0–10.5)
nRBC: 0 % (ref 0.0–0.2)

## 2022-02-28 LAB — BASIC METABOLIC PANEL
Anion gap: 9 (ref 5–15)
BUN: 71 mg/dL — ABNORMAL HIGH (ref 6–20)
CO2: 24 mmol/L (ref 22–32)
Calcium: 8 mg/dL — ABNORMAL LOW (ref 8.9–10.3)
Chloride: 99 mmol/L (ref 98–111)
Creatinine, Ser: 3.46 mg/dL — ABNORMAL HIGH (ref 0.61–1.24)
GFR, Estimated: 20 mL/min — ABNORMAL LOW (ref >60)
Glucose, Bld: 178 mg/dL — ABNORMAL HIGH (ref 70–99)
Potassium: 3.8 mmol/L (ref 3.5–5.1)
Sodium: 132 mmol/L — ABNORMAL LOW (ref 135–145)

## 2022-02-28 NOTE — Progress Notes (Signed)
Progress Note  Patient: Hector [name] DOB: August 17, 1965 DOA: 02/23/2022     5 DOS: the patient was seen and examined on 02/28/2022   Brief hospital course: Hector [name] is a 57 y.o. male with medical history significant of  A. fib on Eliquis, pancreatitis, sCHF with EF < 20%, CKD-IV, HTN, HLD, stroke, hypothyroidism, alcohol abuse in remission, GI bleeding, OSA, anemia, COVID infection 06/19/2020, who presents with nausea, vomiting, abdominal pain, generalized weakness.  Workup revealed acute on chronic renal failure and evidence of constipation.  Assessment and Plan: Principal Problem:   Acute renal failure superimposed on stage 4 chronic kidney disease (HCC) Active Problems:   RSV infection   Abdominal pain   Hyponatremia   Essential hypertension   Paroxysmal atrial fibrillation (HCC)   Chronic systolic CHF (congestive heart failure) (HCC)   Hypothyroidism   Stroke (HCC)   HLD (hyperlipidemia)   UTI (urinary tract infection)   Obesity (BMI 30-39.9)   Type II diabetes mellitus with renal manifestations (HCC)    Assessment and Plan: Acute renal failure superimposed on stage 4 chronic kidney disease ([place]): Likely due to multifactorial etiology, including possible UTI, dehydration, and continuation of diuretics.  No hydronephrosis by CT scan.  Dr. Holley Raring of renal following. -Admit to telemetry bed as inpatient -Gentle IV fluid: 40 cc/h of normal saline -Hold diuretics -Avoid using renal toxic medications. -Monitor renal function closely, improving slowly Lab Results  Component Value Date   CREATININE 3.46 (H) 02/28/2022   CREATININE 3.62 (H) 02/27/2022   CREATININE 4.29 (H) 02/26/2022    RSV infection: No oxygen desaturation. -Albuterol and Mucinex -supportive care   Abdominal pain, resolved: Possibly due to constipation, abdominal pain and nausea have resolved.  CT scan of abdomen/pelvis is negative for acute intra-abdominal issues.  Patient has mildly  elevated lipase at 94, but no signs of pancreatitis by CT scan. He is having BMs now. -MiraLAX and senokot for constipation -As needed Zofran   Hyponatremia: Asymptomatic, relatively stable, up to 132 today.  Likely due to poor oral intake and dehydration, and continuation of diuretics use - IVF: 40 mL/h of normal saline   Essential hypertension: -IV hydralazine as needed -Coreg,   Paroxysmal atrial fibrillation ([place]): Heart rate is 60. -Continue Eliquis, Coreg and amiodarone   Chronic systolic CHF (congestive heart failure) ([place]): 2D echo on 12/08/2021 showed EF<20%.  BNP 669, but patient is clinically dry. 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  Blood sugar controlled at the time of admission.  Patient's not taking medications (supposed to take [place]) -SSI    DVT ppx: on  Eliquis   Code Status: Full code   Disposition Plan:  Anticipate discharge back to previous environment   Consults called:   Nephrology     Subjective:  Seen this morning doing well has no complaints.  Physical Exam: Vitals:   02/27/22 1507 02/27/22 1908 02/28/22 0440 02/28/22 0735  BP: 104/84 (!) 101/91 95/84 (!) 118/94  Pulse: 74 76 74 (!) 47  Resp: 18 16 20 20   Temp: 97.9 F (36.6  C) (!) 97.3 F (36.3 C) 97.9 F (36.6 C) 97.8 F (36.6 C)  TempSrc: Oral Oral Oral Oral  SpO2: 99% 97% 99% 99%  Weight:      Height:      General:  Alert, oriented, calm, in no acute distress, resting in bed Eyes: EOMI, clear conjuctivae, white sclerea Neck: supple, no masses,  trachea mildline  Cardiovascular: RRR, no murmurs or rubs, no peripheral edema  Respiratory: clear to auscultation bilaterally, no wheezes, no crackles  Abdomen: soft, nontender, nondistended, normal bowel tones heard  Skin: dry, no rashes  Musculoskeletal: no joint effusions, normal range of motion  Psychiatric: appropriate affect, normal speech  Neurologic: extraocular muscles intact, clear speech, moving all extremities with intact sensorium    Data Reviewed: CBC, BMP, Cr improving  Time spent: 32 minutes  Author: Harriet Sutphen Marry Guan, MD 02/28/2022 8:57 AM  For on call review [email or web address].

## 2022-02-28 NOTE — Progress Notes (Signed)
[facility]  PROGRESS NOTE   Subjective:   Patient seen at bedside.  Feels much better today.  Objective:  Vital signs: Blood pressure (!) 118/94, pulse 73, temperature 97.8 F (36.6 C), temperature source Oral, resp. rate 20, height 5\' 11"  (1.803 m), weight 108.4 kg, SpO2 100 %.  Intake/Output Summary (Last 24 hours) at 02/28/2022 1458 Last data filed at 02/28/2022 0527 Gross per 24 hour  Intake --  Output 2000 ml  Net -2000 ml   Filed Weights   02/25/22 0500 02/26/22 0500 02/27/22 0500  Weight: 107.8 kg 107.6 kg 108.4 kg     Physical Exam: General:  No acute distress  Head:  Normocephalic, atraumatic. Moist oral mucosal membranes  Eyes:  Anicteric  Neck:  Supple  Lungs:   Clear to auscultation, normal effort  Heart:  S1S2 no rubs  Abdomen:   Soft, nontender, bowel sounds present  Extremities:  peripheral edema.  Neurologic:  Awake, alert, following commands  Skin:  No lesions  Access:     Basic Metabolic Panel: Recent Labs  Lab 02/24/22 2012 02/25/22 0443 02/26/22 0329 02/27/22 0621 02/28/22 0350  NA 128* 130* 131* 131* 132*  K 3.3* 3.4* 3.1* 3.7 3.8  CL 85* 88* 89* 98 99  CO2 26 23 27 25 24   GLUCOSE 191* 142* 133* 159* 178*  BUN 104* 111* 96* 81* 71*  CREATININE 4.76* 4.54* 4.29* 3.62* 3.46*  CALCIUM 8.2* 8.3* 8.1* 8.1* 8.0*   GFR: Estimated Creatinine Clearance: 29.5 mL/min (A) (by C-G formula based on SCr of 3.46 mg/dL (H)).  Liver Function Tests: Recent Labs  Lab 02/23/22 0920  AST 38  ALT 28  ALKPHOS 112  BILITOT 1.9*  PROT 8.5*  ALBUMIN 4.1   Recent Labs  Lab 02/23/22 0920  LIPASE 94*   No results for input(s): "AMMONIA" in the last 168 hours.  CBC: Recent Labs  Lab 02/23/22 0920 02/25/22 0443 02/26/22 0329 02/27/22 0621 02/28/22 0350  WBC 6.9 6.0 5.7 5.5 6.0  HGB 19.8* 16.9 15.8 14.8 14.0  HCT 60.9* 52.9* 48.3 45.6 44.5  MCV 80.1 80.6 80.4 82.5 84.0  PLT 220 159 145* 132* 135*     HbA1C: Hgb A1c MFr Bld   Date/Time Value Ref Range Status  01/07/2022 04:09 AM 6.8 (H) 4.8 - 5.6 % Final    Comment:    (NOTE) Pre diabetes:          5.7%-6.4%  Diabetes:              >6.4%  Glycemic control for   <7.0% adults with diabetes   11/19/2021 04:07 AM 7.2 (H) 4.8 - 5.6 % Final    Comment:    (NOTE) Pre diabetes:          5.7%-6.4%  Diabetes:              >6.4%  Glycemic control for   <7.0% adults with diabetes     Urinalysis: No results for input(s): "COLORURINE", "LABSPEC", "PHURINE", "GLUCOSEU", "HGBUR", "BILIRUBINUR", "KETONESUR", "PROTEINUR", "UROBILINOGEN", "NITRITE", "LEUKOCYTESUR" in the last 72 hours.  Invalid input(s): "APPERANCEUR"    Imaging: No results found.   Medications:    sodium chloride 40 mL/hr at 02/27/22 1421    allopurinol  50 mg Oral Daily   amiodarone  200 mg Oral BID   apixaban  5 mg Oral BID   atorvastatin  80 mg Oral Daily   carvedilol  6.25 mg Oral BID WC   ferrous gluconate  324  mg Oral Q breakfast   fluticasone  2 spray Each Nare Daily   levothyroxine  100 mcg Oral QAC breakfast   senna-docusate  1 tablet Oral BID    Assessment/ Plan:     Hector [name] is a 57 y.o. male with past medical conditions including hypertension, paroxysmal A-fib on Eliquis, CHF with a EF less than 20%, and chronic kidney disease stage IV. Patient presents to the emergency department with nausea, vomiting and shortness of breath. He has been admitted for Dehydration [E86.0] Constipation, unspecified constipation type [K59.00] Acute renal failure superimposed on stage 4 chronic kidney disease, unspecified acute renal failure type ([place]) [N17.9, N18.4] Acute renal failure superimposed on chronic kidney disease, unspecified acute renal failure type, unspecified CKD stage (HCC) [N17.9, N18.9]  #1: Acute kidney injury: Acute kidney injury most likely secondary to prerenal azotemia which has been slowly but steadily improving.  Will continue isotonic saline at 40 cc an  hour.  #2: Anemia: Anemia secondary to chronic kidney disease: Continue to monitor.  #3: Congestive heart failure: Patient has been on 1000 cc fluid restriction.  Diuretics on hold.  #4: Hyponatremia/hypokalemia: Sodium and potassium both improved.  Possibly secondary to decreased p.o. intake.  Will continue to monitor closely.    LOS: [place], [facility] 1/6/20242:58 PM

## 2022-03-01 DIAGNOSIS — B338 Other specified viral diseases: Secondary | ICD-10-CM | POA: Diagnosis not present

## 2022-03-01 LAB — CBC
HCT: 43.2 % (ref 39.0–52.0)
Hemoglobin: 13.6 g/dL (ref 13.0–17.0)
MCH: 26.4 pg (ref 26.0–34.0)
MCHC: 31.5 g/dL (ref 30.0–36.0)
MCV: 83.9 fL (ref 80.0–100.0)
Platelets: 152 10*3/uL (ref 150–400)
RBC: 5.15 MIL/uL (ref 4.22–5.81)
RDW: 17.2 % — ABNORMAL HIGH (ref 11.5–15.5)
WBC: 6.6 10*3/uL (ref 4.0–10.5)
nRBC: 0 % (ref 0.0–0.2)

## 2022-03-01 LAB — BASIC METABOLIC PANEL
Anion gap: 9 (ref 5–15)
BUN: 60 mg/dL — ABNORMAL HIGH (ref 6–20)
CO2: 22 mmol/L (ref 22–32)
Calcium: 8 mg/dL — ABNORMAL LOW (ref 8.9–10.3)
Chloride: 104 mmol/L (ref 98–111)
Creatinine, Ser: 3.07 mg/dL — ABNORMAL HIGH (ref 0.61–1.24)
GFR, Estimated: 23 mL/min — ABNORMAL LOW (ref >60)
Glucose, Bld: 149 mg/dL — ABNORMAL HIGH (ref 70–99)
Potassium: 4.2 mmol/L (ref 3.5–5.1)
Sodium: 135 mmol/L (ref 135–145)

## 2022-03-01 NOTE — Progress Notes (Signed)
Progress Note  Patient: Hector [name] QBH:[id number] DOB: 1965/08/20 DOA: 02/23/2022     6 DOS: the patient was seen and examined on 03/01/2022   Brief hospital course: Hector [name] is a 57 y.o. male with medical history significant of  A. fib on Eliquis, pancreatitis, sCHF with EF < 20%, CKD-IV, HTN, HLD, stroke, hypothyroidism, alcohol abuse in remission, GI bleeding, OSA, anemia, COVID infection 06/19/2020, who presents with nausea, vomiting, abdominal pain, generalized weakness.  Workup revealed acute on chronic renal failure and evidence of constipation.  Assessment and Plan: Principal Problem:   Acute renal failure superimposed on stage 4 chronic kidney disease (HCC) Active Problems:   RSV infection   Abdominal pain   Hyponatremia   Essential hypertension   Paroxysmal atrial fibrillation (HCC)   Chronic systolic CHF (congestive heart failure) (HCC)   Hypothyroidism   Stroke (HCC)   HLD (hyperlipidemia)   UTI (urinary tract infection)   Obesity (BMI 30-39.9)   Type II diabetes mellitus with renal manifestations (HCC)    Assessment and Plan: Acute renal failure superimposed on stage 4 chronic kidney disease ([place]): Likely due to multifactorial etiology, including possible UTI, dehydration, and continuation of diuretics.  No hydronephrosis by CT scan.  Nephrology following. -Gentle IV fluid: 40 cc/h of normal saline -Hold diuretics -Avoid using renal toxic medications. -Monitor renal function closely, improving slowly Lab Results  Component Value Date   CREATININE 3.07 (H) 03/01/2022   CREATININE 3.46 (H) 02/28/2022   CREATININE 3.62 (H) 02/27/2022    RSV infection: No oxygen desaturation. -Albuterol and Mucinex -supportive care   Abdominal pain, resolved: Possibly due to constipation, abdominal pain and nausea have resolved.  CT scan of abdomen/pelvis is negative for acute intra-abdominal issues.  Patient has mildly elevated lipase at 94, but no signs of  pancreatitis by CT scan. He is having BMs now. -MiraLAX and senokot for constipation -As needed Zofran   Hyponatremia: Asymptomatic, relatively stable, up to 135 today.  Likely due to poor oral intake and dehydration, and continuation of diuretics use - IVF: 40 mL/h of normal saline   Essential hypertension: -IV hydralazine as needed -Coreg,   Paroxysmal atrial fibrillation ([place]): Heart rate is 60. -Continue Eliquis, Coreg and amiodarone   Chronic systolic CHF (congestive heart failure) ([place]): 2D echo on 12/08/2021 showed EF<20%.  BNP 669, but patient is clinically dry. 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  Blood sugar controlled at the time of admission.  Patient's not taking medications (supposed to take [place]) -SSI    DVT ppx: on  Eliquis   Code Status: Full code   Disposition Plan:  Anticipate discharge back to previous environment   Consults:   Nephrology     Subjective:  Seen and examined this AM doing well has no complaints.  Physical Exam: Vitals:   02/28/22 1722 02/28/22 1915 03/01/22 0414 03/01/22 0732  BP: (!) 110/97 114/86 (!) 113/92 (!) 123/104  Pulse: 77 86 77 77  Resp: 16 20 20 20   Temp: 98.9 F (37.2 C) 98.5 F (36.9 C) 97.7 F (36.5  C) (!) 97.5 F (36.4 C)  TempSrc: Oral Oral Oral Oral  SpO2: 97% 96% 97% 99%  Weight:   112.4 kg   Height:      General:  Alert, oriented, calm, in no acute distress, resting comfortably in bed Eyes: EOMI, clear conjuctivae, white sclerea Neck: supple, no masses, trachea mildline   Cardiovascular: RRR, no murmurs or rubs, no peripheral edema  Respiratory: clear to auscultation bilaterally, no wheezes, no crackles  Abdomen: soft, nontender, nondistended, normal bowel tones heard  Skin: dry, no rashes  Musculoskeletal: no joint effusions, normal range of motion  Psychiatric: appropriate affect, normal speech  Neurologic: extraocular muscles intact, clear speech, moving all extremities with intact sensorium    Data Reviewed: Labs reviewed as above.  Time spent: 22 minutes  Author: Cameryn Chrisley Marry Guan, MD 03/01/2022 9:38 AM  For on call review [email or web address].

## 2022-03-01 NOTE — Progress Notes (Signed)
[facility]  PROGRESS NOTE   Subjective:   Patient seen at bedside.  Denies any chest pain, shortness of breath.  Urine output has been good.  Objective:  Vital signs: Blood pressure (!) 123/104, pulse 77, temperature (!) 97.5 F (36.4 C), temperature source Oral, resp. rate 20, height 5\' 11"  (1.803 m), weight 112.4 kg, SpO2 99 %.  Intake/Output Summary (Last 24 hours) at 03/01/2022 1420 Last data filed at 03/01/2022 0644 Gross per 24 hour  Intake 2024.09 ml  Output 1200 ml  Net 824.09 ml   Filed Weights   02/26/22 0500 02/27/22 0500 03/01/22 0414  Weight: 107.6 kg 108.4 kg 112.4 kg     Physical Exam: General:  No acute distress  Head:  Normocephalic, atraumatic. Moist oral mucosal membranes  Eyes:  Anicteric  Neck:  Supple  Lungs:   Clear to auscultation, normal effort  Heart:  S1S2 no rubs  Abdomen:   Soft, nontender, bowel sounds present  Extremities:  peripheral edema.  Neurologic:  Awake, alert, following commands  Skin:  No lesions  Access:     Basic Metabolic Panel: Recent Labs  Lab 02/25/22 0443 02/26/22 0329 02/27/22 0621 02/28/22 0350 03/01/22 0336  NA 130* 131* 131* 132* 135  K 3.4* 3.1* 3.7 3.8 4.2  CL 88* 89* 98 99 104  CO2 23 27 25 24 22   GLUCOSE 142* 133* 159* 178* 149*  BUN 111* 96* 81* 71* 60*  CREATININE 4.54* 4.29* 3.62* 3.46* 3.07*  CALCIUM 8.3* 8.1* 8.1* 8.0* 8.0*   GFR: Estimated Creatinine Clearance: 33.8 mL/min (A) (by C-G formula based on SCr of 3.07 mg/dL (H)).  Liver Function Tests: Recent Labs  Lab 02/23/22 0920  AST 38  ALT 28  ALKPHOS 112  BILITOT 1.9*  PROT 8.5*  ALBUMIN 4.1   Recent Labs  Lab 02/23/22 0920  LIPASE 94*   No results for input(s): "AMMONIA" in the last 168 hours.  CBC: Recent Labs  Lab 02/25/22 0443 02/26/22 0329 02/27/22 0621 02/28/22 0350 03/01/22 0336  WBC 6.0 5.7 5.5 6.0 6.6  HGB 16.9 15.8 14.8 14.0 13.6  HCT 52.9* 48.3 45.6 44.5 43.2  MCV 80.6 80.4 82.5 84.0 83.9  PLT  159 145* 132* 135* 152     HbA1C: Hgb A1c MFr Bld  Date/Time Value Ref Range Status  01/07/2022 04:09 AM 6.8 (H) 4.8 - 5.6 % Final    Comment:    (NOTE) Pre diabetes:          5.7%-6.4%  Diabetes:              >6.4%  Glycemic control for   <7.0% adults with diabetes   11/19/2021 04:07 AM 7.2 (H) 4.8 - 5.6 % Final    Comment:    (NOTE) Pre diabetes:          5.7%-6.4%  Diabetes:              >6.4%  Glycemic control for   <7.0% adults with diabetes     Urinalysis: No results for input(s): "COLORURINE", "LABSPEC", "PHURINE", "GLUCOSEU", "HGBUR", "BILIRUBINUR", "KETONESUR", "PROTEINUR", "UROBILINOGEN", "NITRITE", "LEUKOCYTESUR" in the last 72 hours.  Invalid input(s): "APPERANCEUR"    Imaging: No results found.   Medications:    sodium chloride 40 mL/hr at 03/01/22 0644    allopurinol  50 mg Oral Daily   amiodarone  200 mg Oral BID   apixaban  5 mg Oral BID   atorvastatin  80 mg Oral Daily   carvedilol  6.25 mg Oral BID WC   ferrous gluconate  324 mg Oral Q breakfast   fluticasone  2 spray Each Nare Daily   levothyroxine  100 mcg Oral QAC breakfast   senna-docusate  1 tablet Oral BID    Assessment/ Plan:     Hector [name] is a 57 y.o. male with past medical conditions including hypertension, paroxysmal A-fib on Eliquis, CHF with a EF less than 20%, and chronic kidney disease stage IV. Patient presents to the emergency department with nausea, vomiting and shortness of breath. He has been admitted for Dehydration [E86.0] Constipation, unspecified constipation type [K59.00] Acute renal failure superimposed on stage 4 chronic kidney disease, unspecified acute renal failure type ([place]) [N17.9, N18.4] Acute renal failure superimposed on chronic kidney disease, unspecified acute renal failure type, unspecified CKD stage (HCC) [N17.9, N18.9]   #1: Acute kidney injury: Acute kidney injury most likely secondary to prerenal azotemia which has been slowly but  steadily improving.  Will continue isotonic saline at 40 cc an hour.   #2: Anemia: Anemia secondary to chronic kidney disease: Continue iron.  Continue to monitor.   #3: Congestive heart failure: Patient has been on 1000 cc fluid restriction.  Diuretics on hold.   #4: Hyponatremia/hypokalemia: Sodium and potassium both improved.  Possibly secondary to decreased p.o. intake.   Will continue to monitor closely.    LOS: [place], [facility] 1/7/20242:20 PM

## 2022-03-02 DIAGNOSIS — B338 Other specified viral diseases: Secondary | ICD-10-CM | POA: Diagnosis not present

## 2022-03-02 LAB — CBC
HCT: 43.3 % (ref 39.0–52.0)
Hemoglobin: 13.6 g/dL (ref 13.0–17.0)
MCH: 26.4 pg (ref 26.0–34.0)
MCHC: 31.4 g/dL (ref 30.0–36.0)
MCV: 84.1 fL (ref 80.0–100.0)
Platelets: 140 10*3/uL — ABNORMAL LOW (ref 150–400)
RBC: 5.15 MIL/uL (ref 4.22–5.81)
RDW: 17.7 % — ABNORMAL HIGH (ref 11.5–15.5)
WBC: 7.7 10*3/uL (ref 4.0–10.5)
nRBC: 0 % (ref 0.0–0.2)

## 2022-03-02 LAB — BASIC METABOLIC PANEL
Anion gap: 8 (ref 5–15)
BUN: 52 mg/dL — ABNORMAL HIGH (ref 6–20)
CO2: 21 mmol/L — ABNORMAL LOW (ref 22–32)
Calcium: 8.1 mg/dL — ABNORMAL LOW (ref 8.9–10.3)
Chloride: 106 mmol/L (ref 98–111)
Creatinine, Ser: 2.83 mg/dL — ABNORMAL HIGH (ref 0.61–1.24)
GFR, Estimated: 25 mL/min — ABNORMAL LOW (ref >60)
Glucose, Bld: 149 mg/dL — ABNORMAL HIGH (ref 70–99)
Potassium: 4.5 mmol/L (ref 3.5–5.1)
Sodium: 135 mmol/L (ref 135–145)

## 2022-03-02 NOTE — Progress Notes (Signed)
Progress Note  Patient: Hector [name]:[id number] DOB: 08-30-1965 DOA: 02/23/2022     7 DOS: the patient was seen and examined on 03/02/2022   Brief hospital course: Hector [name] is a 57 y.o. male with medical history significant of  A. fib on Eliquis, pancreatitis, sCHF with EF < 20%, CKD-IV, HTN, HLD, stroke, hypothyroidism, alcohol abuse in remission, GI bleeding, OSA, anemia, COVID infection 06/19/2020, who presents with nausea, vomiting, abdominal pain, generalized weakness.  Workup revealed acute on chronic renal failure and evidence of constipation.  Assessment and Plan: Principal Problem:   Acute renal failure superimposed on stage 4 chronic kidney disease (HCC) Active Problems:   RSV infection   Abdominal pain   Hyponatremia   Essential hypertension   Paroxysmal atrial fibrillation (HCC)   Chronic systolic CHF (congestive heart failure) (HCC)   Hypothyroidism   Stroke (HCC)   HLD (hyperlipidemia)   UTI (urinary tract infection)   Obesity (BMI 30-39.9)   Type II diabetes mellitus with renal manifestations (HCC)    Assessment and Plan: Acute renal failure superimposed on stage 4 chronic kidney disease ([place]): Likely due to multifactorial etiology, including possible UTI, dehydration, and continuation of diuretics.  No hydronephrosis by CT scan.  Nephrology following. -Gentle IV fluid: 40 cc/h of normal saline -Hold diuretics -Avoid using renal toxic medications. -Monitor renal function closely, improving slowly Lab Results  Component Value Date   CREATININE 2.83 (H) 03/02/2022   CREATININE 3.07 (H) 03/01/2022   CREATININE 3.46 (H) 02/28/2022    RSV infection: No oxygen desaturation. -Albuterol and Mucinex -supportive care   Abdominal pain, resolved: Possibly due to constipation, abdominal pain and nausea have resolved.  CT scan of abdomen/pelvis is negative for acute intra-abdominal issues.  Patient has mildly elevated lipase at 94, but no signs of  pancreatitis by CT scan. He is having BMs now. -MiraLAX and senokot for constipation -As needed Zofran   Hyponatremia: Asymptomatic, relatively stable, up to 135 today.  Likely due to poor oral intake and dehydration, and continuation of diuretics use - IVF: 40 mL/h of normal saline   Essential hypertension: -IV hydralazine as needed -Coreg,   Paroxysmal atrial fibrillation ([place]): Heart rate is 60. -Continue Eliquis, Coreg and amiodarone   Chronic systolic CHF (congestive heart failure) ([place]): 2D echo on 12/08/2021 showed EF<20%.  BNP 669, but patient is clinically dry. 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  Blood sugar controlled at the time of admission.  Patient's not taking medications (supposed to take [place]) -SSI    DVT ppx: on  Eliquis   Code Status: Full code   Disposition Plan:  Anticipate discharge back to previous environment   Consults:   Nephrology     Subjective:  Seen and examined this AM doing well but has been nauseated since last night.  Physical Exam: Vitals:   03/01/22 1603 03/01/22 1917 03/02/22 0421 03/02/22 0916  BP: 116/88 (!) 115/94 (!) 110/97 (!) 124/105  Pulse: 75 79 74 78  Resp: 18 20 20 16   Temp: 98.5 F (36.9 C) (!) 97.5 F (  36.4 C) 97.6 F (36.4 C)   TempSrc: Oral Oral Oral   SpO2: 96% 97% 98% 98%  Weight:   103 kg   Height:      General:  Alert, oriented, calm, in no acute distress, on edge of bed feels nauseated Eyes: EOMI, clear conjuctivae, white sclerea Neck: supple, no masses, trachea  mildline  Cardiovascular: RRR, no murmurs or rubs, no peripheral edema  Respiratory: clear to auscultation bilaterally, no wheezes, no crackles  Abdomen: soft, nontender, nondistended, normal bowel tones heard  Skin: dry, no rashes  Musculoskeletal: no joint effusions, normal range of motion  Psychiatric: appropriate affect, normal speech  Neurologic: extraocular muscles intact, clear speech, moving all extremities with intact sensorium     Data Reviewed: Labs reviewed as above.  Time spent: 22 minutes  Author: Marshon Bangs Marry Guan, MD 03/02/2022 9:17 AM  For on call review [email or web address].

## 2022-03-02 NOTE — Progress Notes (Signed)
  Chart reviewed and d/w nursing staff. Given current hospitalization and multi-system dysfunction in setting of recent RSV infection we have cancelled outpatient DC-CV for tomorrow. HRs are currently controlled.   We will arrange for f/u in the outpatient HF Clinic soon after d/c and reschedule cardioversion when more appropriate.   I have ordered ECG for this am.   Glori Bickers, MD  10:41 AM

## 2022-03-02 NOTE — Progress Notes (Signed)
[facility]  ROUNDING NOTE   Subjective:   Hector [name] is a 57 y.o. male with past medical conditions including hypertension, paroxysmal A-fib on Eliquis, CHF with a EF less than 20%, and chronic kidney disease stage IV. Patient presents to the emergency department with nausea, vomiting and shortness of breath. He has been admitted for Dehydration [E86.0] Constipation, unspecified constipation type [K59.00] Acute renal failure superimposed on stage 4 chronic kidney disease, unspecified acute renal failure type ([place]) [N17.9, N18.4] Acute renal failure superimposed on chronic kidney disease, unspecified acute renal failure type, unspecified CKD stage ([place]) [N17.9, N18.9]  Patient is known to our practice from previous admissions and is currently followed by [facility] nephrology outpatient.    Patient seen resting quietly Alert and oriented Appetite poor today Continues to have loose stools   Objective:  Vital signs in last 24 hours:  Temp:  [97.5 F (36.4 C)-98.2 F (36.8 C)] 98.2 F (36.8 C) (01/08 1537) Pulse Rate:  [46-79] 46 (01/08 1537) Resp:  [16-20] 16 (01/08 0916) BP: (110-124)/(94-105) 116/94 (01/08 1537) SpO2:  [97 %-98 %] 98 % (01/08 0916) Weight:  [103 kg] 103 kg (01/08 0421)  Weight change: -9.4 kg Filed Weights   02/27/22 0500 03/01/22 0414 03/02/22 0421  Weight: 108.4 kg 112.4 kg 103 kg    Intake/Output: I/O last 3 completed shifts: In: 2957.7 [P.O.:120; I.V.:2837.7] Out: 2800 [Urine:2800]   Intake/Output this shift:  Total I/O In: -  Out: 175 [Urine:175]  Physical Exam: General: NAD  Head: Normocephalic, atraumatic. Moist oral mucosal membranes  Eyes: Anicteric  Lungs:  Clear to auscultation, normal effort, room air  Heart: Regular rate and rhythm  Abdomen:  Soft, nontender  Extremities: 1+ peripheral edema.  Neurologic: Nonfocal, moving all four extremities  Skin: No lesions  Access: None    Basic Metabolic Panel: Recent Labs   Lab 02/26/22 0329 02/27/22 0621 02/28/22 0350 03/01/22 0336 03/02/22 0259  NA 131* 131* 132* 135 135  K 3.1* 3.7 3.8 4.2 4.5  CL 89* 98 99 104 106  CO2 27 25 24 22  21*  GLUCOSE 133* 159* 178* 149* 149*  BUN 96* 81* 71* 60* 52*  CREATININE 4.29* 3.62* 3.46* 3.07* 2.83*  CALCIUM 8.1* 8.1* 8.0* 8.0* 8.1*     Liver Function Tests: No results for input(s): "AST", "ALT", "ALKPHOS", "BILITOT", "PROT", "ALBUMIN" in the last 168 hours.  No results for input(s): "LIPASE", "AMYLASE" in the last 168 hours.  No results for input(s): "AMMONIA" in the last 168 hours.  CBC: Recent Labs  Lab 02/26/22 0329 02/27/22 0621 02/28/22 0350 03/01/22 0336 03/02/22 0259  WBC 5.7 5.5 6.0 6.6 7.7  HGB 15.8 14.8 14.0 13.6 13.6  HCT 48.3 45.6 44.5 43.2 43.3  MCV 80.4 82.5 84.0 83.9 84.1  PLT 145* 132* 135* 152 140*     Cardiac Enzymes: No results for input(s): "CKTOTAL", "CKMB", "CKMBINDEX", "TROPONINI" in the last 168 hours.  BNP: Invalid input(s): "POCBNP"  CBG: No results for input(s): "GLUCAP" in the last 168 hours.  Microbiology: Results for orders placed or performed during the hospital encounter of 02/23/22  Resp panel by RT-PCR (RSV, Flu A&B, Covid) Anterior Nasal Swab     Status: Abnormal   Collection Time: 02/23/22  9:20 AM   Specimen: Anterior Nasal Swab  Result Value Ref Range Status   SARS Coronavirus 2 by RT PCR NEGATIVE NEGATIVE Final    Comment: (NOTE) SARS-CoV-2 target nucleic acids are NOT DETECTED.  The SARS-CoV-2 RNA is generally  detectable in upper respiratory specimens during the acute phase of infection. The lowest concentration of SARS-CoV-2 viral copies this assay can detect is 138 copies/mL. A negative result does not preclude SARS-Cov-2 infection and should not be used as the sole basis for treatment or other patient management decisions. A negative result may occur with  improper specimen collection/handling, submission of specimen other than  nasopharyngeal swab, presence of viral mutation(s) within the areas targeted by this assay, and inadequate number of viral copies(<138 copies/mL). A negative result must be combined with clinical observations, patient history, and epidemiological information. The expected result is Negative.  Fact Sheet for Patients:  EntrepreneurPulse.com.au  Fact Sheet for Healthcare Providers:  IncredibleEmployment.be  This test is no t yet approved or cleared by the [place] FDA and  has been authorized for detection and/or diagnosis of SARS-CoV-2 by FDA under an Emergency Use Authorization (EUA). This EUA will remain  in effect (meaning this test can be used) for the duration of the COVID-19 declaration under Section 564(b)(1) of the Act, 21 U.S.C.section 360bbb-3(b)(1), unless the authorization is terminated  or revoked sooner.       Influenza A by PCR NEGATIVE NEGATIVE Final   Influenza B by PCR NEGATIVE NEGATIVE Final    Comment: (NOTE) The Xpert Xpress SARS-CoV-2/FLU/RSV plus assay is intended as an aid in the diagnosis of influenza from Nasopharyngeal swab specimens and should not be used as a sole basis for treatment. Nasal washings and aspirates are unacceptable for Xpert Xpress SARS-CoV-2/FLU/RSV testing.  Fact Sheet for Patients: EntrepreneurPulse.com.au  Fact Sheet for Healthcare Providers: IncredibleEmployment.be  This test is not yet approved or cleared by the [place] FDA and has been authorized for detection and/or diagnosis of SARS-CoV-2 by FDA under an Emergency Use Authorization (EUA). This EUA will remain in effect (meaning this test can be used) for the duration of the COVID-19 declaration under Section 564(b)(1) of the Act, 21 U.S.C. section 360bbb-3(b)(1), unless the authorization is terminated or revoked.     Resp Syncytial Virus by PCR POSITIVE (A) NEGATIVE Final    Comment:  (NOTE) Fact Sheet for Patients: EntrepreneurPulse.com.au  Fact Sheet for Healthcare Providers: IncredibleEmployment.be  This test is not yet approved or cleared by the [place] FDA and has been authorized for detection and/or diagnosis of SARS-CoV-2 by FDA under an Emergency Use Authorization (EUA). This EUA will remain in effect (meaning this test can be used) for the duration of the COVID-19 declaration under Section 564(b)(1) of the Act, 21 U.S.C. section 360bbb-3(b)(1), unless the authorization is terminated or revoked.  Performed at [facility], [address] [place], [place] [address]   Urine Culture     Status: Abnormal   Collection Time: 02/23/22 11:08 AM   Specimen: Urine, Clean Catch  Result Value Ref Range Status   Specimen Description   Final    URINE, CLEAN CATCH Performed at [facility], [address], [name], [place] [address]    Special Requests   Final    NONE Performed at [facility], [address] [place], [place] [address]    Culture (A)  Final    <10,000 COLONIES/mL INSIGNIFICANT GROWTH Performed at [facility] [address], [place], [place] [address]    Report Status 02/25/2022 FINAL  Final    Coagulation Studies: No results for input(s): "LABPROT", "INR" in the last 72 hours.  Urinalysis: No results for input(s): "COLORURINE", "LABSPEC", "PHURINE", "GLUCOSEU", "HGBUR", "BILIRUBINUR", "KETONESUR", "PROTEINUR", "UROBILINOGEN", "NITRITE", "LEUKOCYTESUR"  in the last 72 hours.  Invalid input(s): "APPERANCEUR"     Imaging: No results found.   Medications:    sodium chloride 40 mL/hr at 03/02/22 0900    allopurinol  50 mg Oral Daily   amiodarone  200 mg Oral BID   apixaban  5 mg Oral BID   atorvastatin  80 mg Oral Daily   carvedilol  6.25 mg Oral BID WC   ferrous gluconate  324 mg Oral Q breakfast   fluticasone  2 spray Each Nare Daily   levothyroxine   100 mcg Oral QAC breakfast   senna-docusate  1 tablet Oral BID   acetaminophen, albuterol, dextromethorphan-guaiFENesin, hydrALAZINE, ondansetron (ZOFRAN) IV, polyethylene glycol  Assessment/ Plan:  [name] is a 57 y.o.  male with past medical conditions including hypertension, paroxysmal A-fib on Eliquis, CHF with a EF less than 20%, and chronic kidney disease stage IV. Patient presents to the emergency department with shortness of breath and increased lower extremity edema.   Acute Kidney Injury with hyponatremia on chronic kidney disease stage 4 with baseline creatinine 2.63 and GFR of 28 on 11/18/21.  Acute kidney injury secondary to prerenal azotemia.  And Chronic kidney disease is secondary to hypertension.  CT abdomen pelvis 4.5 cm cyst on the right lower pole, no obstruction.  Renal function has returned to normal. Continue gentle hydration for 1 day. No need for dialysis.   Lab Results  Component Value Date   CREATININE 2.83 (H) 03/02/2022   CREATININE 3.07 (H) 03/01/2022   CREATININE 3.46 (H) 02/28/2022    Intake/Output Summary (Last 24 hours) at 03/02/2022 1624 Last data filed at 03/02/2022 0943 Gross per 24 hour  Intake 556 ml  Output 1575 ml  Net -1019 ml    2. Anemia of chronic kidney disease Lab Results  Component Value Date   HGB 13.6 03/02/2022    Hemoglobin above desired range.  No need for ESA's at this time  3. Chronic systolic and diastolic heart failure.  Echo from 12/08/2021 shows EF less than 20% with a moderate to severe mitral valve regurgitation.  Diuretics held.  4. 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 Home regimen includes [place], held. Most recent hemoglobin A1c is 7.2 on 11/19/21.    5. Hypokalemia, likely due to kidney injury. Potassium corrected to 4.5   LOS: 7   1/8/20244:24 PM

## 2022-03-03 ENCOUNTER — Encounter: Admission: RE | Payer: Self-pay | Source: Ambulatory Visit

## 2022-03-03 ENCOUNTER — Ambulatory Visit: Admission: RE | Admit: 2022-03-03 | Payer: Medicaid Other | Source: Ambulatory Visit | Admitting: Internal Medicine

## 2022-03-03 DIAGNOSIS — B338 Other specified viral diseases: Secondary | ICD-10-CM | POA: Diagnosis not present

## 2022-03-03 LAB — CBC
HCT: 48.2 % (ref 39.0–52.0)
Hemoglobin: 15.4 g/dL (ref 13.0–17.0)
MCH: 26.9 pg (ref 26.0–34.0)
MCHC: 32 g/dL (ref 30.0–36.0)
MCV: 84.1 fL (ref 80.0–100.0)
Platelets: 161 10*3/uL (ref 150–400)
RBC: 5.73 MIL/uL (ref 4.22–5.81)
RDW: 17.6 % — ABNORMAL HIGH (ref 11.5–15.5)
WBC: 8.7 10*3/uL (ref 4.0–10.5)
nRBC: 0 % (ref 0.0–0.2)

## 2022-03-03 LAB — BASIC METABOLIC PANEL
Anion gap: 8 (ref 5–15)
BUN: 53 mg/dL — ABNORMAL HIGH (ref 6–20)
CO2: 19 mmol/L — ABNORMAL LOW (ref 22–32)
Calcium: 8.4 mg/dL — ABNORMAL LOW (ref 8.9–10.3)
Chloride: 105 mmol/L (ref 98–111)
Creatinine, Ser: 2.85 mg/dL — ABNORMAL HIGH (ref 0.61–1.24)
GFR, Estimated: 25 mL/min — ABNORMAL LOW (ref >60)
Glucose, Bld: 126 mg/dL — ABNORMAL HIGH (ref 70–99)
Potassium: 5.3 mmol/L — ABNORMAL HIGH (ref 3.5–5.1)
Sodium: 132 mmol/L — ABNORMAL LOW (ref 135–145)

## 2022-03-03 SURGERY — CARDIOVERSION
Anesthesia: General

## 2022-03-03 MED ORDER — ALUM & MAG HYDROXIDE-SIMETH 200-200-20 MG/5ML PO SUSP
30.0000 mL | Freq: Four times a day (QID) | ORAL | Status: DC | PRN
  Administered 2022-03-03 (×2): 30 mL via ORAL
  Filled 2022-03-03 (×2): qty 30

## 2022-03-03 MED ORDER — PATIROMER SORBITEX CALCIUM 8.4 G PO PACK
16.8000 g | PACK | Freq: Every day | ORAL | Status: DC
  Administered 2022-03-03 – 2022-03-05 (×3): 16.8 g via ORAL
  Filled 2022-03-03 (×3): qty 2

## 2022-03-03 NOTE — Progress Notes (Signed)
Progress Note  Patient: Hector [name] FGH:[id number] DOB: [birth year] DOA: 02/23/2022     8 DOS: the patient was seen and examined on 03/03/2022   Brief hospital course: AZAR SOUTH is a 57 y.o. male with medical history significant of  A. fib on Eliquis, pancreatitis, sCHF with EF < 20%, CKD-IV, HTN, HLD, stroke, hypothyroidism, alcohol abuse in remission, GI bleeding, OSA, anemia, COVID infection 06/19/2020, who presents with nausea, vomiting, abdominal pain, generalized weakness.  Workup revealed acute on chronic renal failure and evidence of constipation.  Assessment and Plan: Principal Problem:   Acute renal failure superimposed on stage 4 chronic kidney disease (HCC) Active Problems:   RSV infection   Abdominal pain   Hyponatremia   Essential hypertension   Paroxysmal atrial fibrillation (HCC)   Chronic systolic CHF (congestive heart failure) (HCC)   Hypothyroidism   Stroke (HCC)   HLD (hyperlipidemia)   UTI (urinary tract infection)   Obesity (BMI 30-39.9)   Type II diabetes mellitus with renal manifestations (HCC)    Assessment and Plan: Acute renal failure superimposed on stage 4 chronic kidney disease ([place]): Likely due to multifactorial etiology, including possible UTI, dehydration, and continuation of diuretics.  No hydronephrosis by CT scan.  Nephrology following. -Gentle IV fluid: 40 cc/h of normal saline -Hold diuretics -Avoid using renal toxic medications. -Monitor renal function closely, improving slowly overall but has stabilized over the last 24 hours Lab Results  Component Value Date   CREATININE 2.85 (H) 03/03/2022   CREATININE 2.83 (H) 03/02/2022   CREATININE 3.07 (H) 03/01/2022    RSV infection: No oxygen desaturation. -Albuterol and Mucinex -supportive care   Abdominal pain, resolved: Possibly due to constipation, abdominal pain and nausea have resolved.  CT scan of abdomen/pelvis is negative for acute intra-abdominal issues.  Patient has mildly  elevated lipase at 94, but no signs of pancreatitis by CT scan. He is having BMs now. -MiraLAX and senokot for constipation -As needed Zofran   Hyponatremia: Asymptomatic, relatively stable, up to 135 today.  Likely due to poor oral intake and dehydration, and continuation of diuretics use - IVF: 40 mL/h of normal saline   Essential hypertension: -IV hydralazine as needed -Coreg,   Paroxysmal atrial fibrillation ([place]): Heart rate is low this morning, asymptomatic. -Continue Eliquis, Coreg and amiodarone (Coreg and amiodarone held this morning until heart rate improves)   Chronic systolic CHF (congestive heart failure) ([place]): 2D echo on 12/08/2021 showed EF<20%.  BNP 669, but patient is clinically dry. 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  Blood sugar controlled at the time of admission.  Patient's not taking medications (supposed to take [place]) -SSI    DVT ppx: on  Eliquis   Code Status: Full code   Disposition Plan:  Anticipate discharge back to previous environment in the next 2 to 3 days as renal function stabilizes.   Consults:   Nephrology     Subjective:  Seen and examined this morning, has no complaints of dizziness, chest pain, etc.  He however he is having some gastric discomfort and just got  some Maalox this morning.  Physical Exam: Vitals:   03/02/22 2151 03/03/22 0500 03/03/22 0606 03/03/22 0844  BP: (!) 123/101  (!) 121/97 (!) 118/96  Pulse: 84  62 (!) 55  Resp: 16  20 18   Temp: 97.8 F (36.6 C)  98 F (36.7 C) (!) 97.4 F  (36.3 C)  TempSrc: Oral  Oral Oral  SpO2: 95%  99% 100%  Weight:  111.3 kg    Height:      General exam: Appears calm and comfortable  Respiratory system: Clear to auscultation. Respiratory effort normal. Cardiovascular system: S1 & S2 heard, RRR. No JVD, murmurs, rubs, gallops or clicks. No pedal edema. Gastrointestinal system: Abdomen is nondistended, soft and nontender. No organomegaly or masses felt. Normal bowel sounds heard. Central nervous system: Alert and orientedx3. No focal neurological deficits. Extremities: Symmetric 5 x 5 power. Skin: No rashes, lesions or ulcers Psychiatry: Judgement and insight appear normal. Mood & affect appropriate.   Data Reviewed: Labs reviewed as above.  Time spent: 22 minutes  Author: Roselynn Whitacre Marry Guan, MD 03/03/2022 10:26 AM  For on call review [email or web address]

## 2022-03-03 NOTE — Progress Notes (Signed)
[facility]  ROUNDING NOTE   Subjective:   Hector [name] is a 57 y.o. male with past medical conditions including hypertension, paroxysmal A-fib on Eliquis, CHF with a EF less than 20%, and chronic kidney disease stage IV. Patient presents to the emergency department with nausea, vomiting and shortness of breath. He has been admitted for Dehydration [E86.0] Constipation, unspecified constipation type [K59.00] Acute renal failure superimposed on stage 4 chronic kidney disease, unspecified acute renal failure type ([place]) [N17.9, N18.4] Acute renal failure superimposed on chronic kidney disease, unspecified acute renal failure type, unspecified CKD stage ([place]) [N17.9, N18.9]  Patient is known to our practice from previous admissions and is currently followed by [facility] nephrology outpatient.    Patient sitting at side of bed Alert and oriented Appetite remains poor this morning Reports continued loose stools  Objective:  Vital signs in last 24 hours:  Temp:  [97.4 F (36.3 C)-98.2 F (36.8 C)] 97.4 F (36.3 C) (01/09 0844) Pulse Rate:  [46-84] 55 (01/09 0844) Resp:  [16-20] 18 (01/09 0844) BP: (116-123)/(94-101) 118/96 (01/09 0844) SpO2:  [95 %-100 %] 100 % (01/09 0844) Weight:  [111.3 kg] 111.3 kg (01/09 0500)  Weight change: 8.3 kg Filed Weights   03/01/22 0414 03/02/22 0421 03/03/22 0500  Weight: 112.4 kg 103 kg 111.3 kg    Intake/Output: I/O last 3 completed shifts: In: 982.2 [I.V.:982.2] Out: 1275 [GYJEH:6314]   Intake/Output this shift:  No intake/output data recorded.  Physical Exam: General: NAD  Head: Normocephalic, atraumatic. Moist oral mucosal membranes  Eyes: Anicteric  Lungs:  Clear to auscultation, normal effort, room air  Heart: Regular rate and rhythm  Abdomen:  Soft, nontender  Extremities: 1+ peripheral edema.  Neurologic: Nonfocal, moving all four extremities  Skin: No lesions  Access: None    Basic Metabolic Panel: Recent Labs   Lab 02/27/22 0621 02/28/22 0350 03/01/22 0336 03/02/22 0259 03/03/22 0311  NA 131* 132* 135 135 132*  K 3.7 3.8 4.2 4.5 5.3*  CL 98 99 104 106 105  CO2 25 24 22  21* 19*  GLUCOSE 159* 178* 149* 149* 126*  BUN 81* 71* 60* 52* 53*  CREATININE 3.62* 3.46* 3.07* 2.83* 2.85*  CALCIUM 8.1* 8.0* 8.0* 8.1* 8.4*     Liver Function Tests: No results for input(s): "AST", "ALT", "ALKPHOS", "BILITOT", "PROT", "ALBUMIN" in the last 168 hours.  No results for input(s): "LIPASE", "AMYLASE" in the last 168 hours.  No results for input(s): "AMMONIA" in the last 168 hours.  CBC: Recent Labs  Lab 02/27/22 0621 02/28/22 0350 03/01/22 0336 03/02/22 0259 03/03/22 0311  WBC 5.5 6.0 6.6 7.7 8.7  HGB 14.8 14.0 13.6 13.6 15.4  HCT 45.6 44.5 43.2 43.3 48.2  MCV 82.5 84.0 83.9 84.1 84.1  PLT 132* 135* 152 140* 161     Cardiac Enzymes: No results for input(s): "CKTOTAL", "CKMB", "CKMBINDEX", "TROPONINI" in the last 168 hours.  BNP: Invalid input(s): "POCBNP"  CBG: No results for input(s): "GLUCAP" in the last 168 hours.  Microbiology: Results for orders placed or performed during the hospital encounter of 02/23/22  Resp panel by RT-PCR (RSV, Flu A&B, Covid) Anterior Nasal Swab     Status: Abnormal   Collection Time: 02/23/22  9:20 AM   Specimen: Anterior Nasal Swab  Result Value Ref Range Status   SARS Coronavirus 2 by RT PCR NEGATIVE NEGATIVE Final    Comment: (NOTE) SARS-CoV-2 target nucleic acids are NOT DETECTED.  The SARS-CoV-2 RNA is generally detectable in upper  respiratory specimens during the acute phase of infection. The lowest concentration of SARS-CoV-2 viral copies this assay can detect is 138 copies/mL. A negative result does not preclude SARS-Cov-2 infection and should not be used as the sole basis for treatment or other patient management decisions. A negative result may occur with  improper specimen collection/handling, submission of specimen other than  nasopharyngeal swab, presence of viral mutation(s) within the areas targeted by this assay, and inadequate number of viral copies(<138 copies/mL). A negative result must be combined with clinical observations, patient history, and epidemiological information. The expected result is Negative.  Fact Sheet for Patients:  EntrepreneurPulse.com.au  Fact Sheet for Healthcare Providers:  IncredibleEmployment.be  This test is no t yet approved or cleared by the [place] FDA and  has been authorized for detection and/or diagnosis of SARS-CoV-2 by FDA under an Emergency Use Authorization (EUA). This EUA will remain  in effect (meaning this test can be used) for the duration of the COVID-19 declaration under Section 564(b)(1) of the Act, 21 U.S.C.section 360bbb-3(b)(1), unless the authorization is terminated  or revoked sooner.       Influenza A by PCR NEGATIVE NEGATIVE Final   Influenza B by PCR NEGATIVE NEGATIVE Final    Comment: (NOTE) The Xpert Xpress SARS-CoV-2/FLU/RSV plus assay is intended as an aid in the diagnosis of influenza from Nasopharyngeal swab specimens and should not be used as a sole basis for treatment. Nasal washings and aspirates are unacceptable for Xpert Xpress SARS-CoV-2/FLU/RSV testing.  Fact Sheet for Patients: EntrepreneurPulse.com.au  Fact Sheet for Healthcare Providers: IncredibleEmployment.be  This test is not yet approved or cleared by the [place] FDA and has been authorized for detection and/or diagnosis of SARS-CoV-2 by FDA under an Emergency Use Authorization (EUA). This EUA will remain in effect (meaning this test can be used) for the duration of the COVID-19 declaration under Section 564(b)(1) of the Act, 21 U.S.C. section 360bbb-3(b)(1), unless the authorization is terminated or revoked.     Resp Syncytial Virus by PCR POSITIVE (A) NEGATIVE Final    Comment:  (NOTE) Fact Sheet for Patients: EntrepreneurPulse.com.au  Fact Sheet for Healthcare Providers: IncredibleEmployment.be  This test is not yet approved or cleared by the [place] FDA and has been authorized for detection and/or diagnosis of SARS-CoV-2 by FDA under an Emergency Use Authorization (EUA). This EUA will remain in effect (meaning this test can be used) for the duration of the COVID-19 declaration under Section 564(b)(1) of the Act, 21 U.S.C. section 360bbb-3(b)(1), unless the authorization is terminated or revoked.  Performed at [facility], [address] [place], [place] [address]   Urine Culture     Status: Abnormal   Collection Time: 02/23/22 11:08 AM   Specimen: Urine, Clean Catch  Result Value Ref Range Status   Specimen Description   Final    URINE, CLEAN CATCH Performed at [facility], [address] [place], [place] [address]    Special Requests   Final    NONE Performed at [facility], [address] [place], [place] [address]    Culture (A)  Final    <10,000 COLONIES/mL INSIGNIFICANT GROWTH Performed at [facility] [address], [place], [place] [address]    Report Status 02/25/2022 FINAL  Final    Coagulation Studies: No results for input(s): "LABPROT", "INR" in the last 72 hours.  Urinalysis: No results for input(s): "COLORURINE", "LABSPEC", "PHURINE", "GLUCOSEU", "HGBUR", "BILIRUBINUR", "KETONESUR", "PROTEINUR", "UROBILINOGEN", "NITRITE", "LEUKOCYTESUR" in the last  72 hours.  Invalid input(s): "APPERANCEUR"     Imaging: No results found.   Medications:    sodium chloride 40 mL/hr at 03/02/22 1658    allopurinol  50 mg Oral Daily   amiodarone  200 mg Oral BID   apixaban  5 mg Oral BID   atorvastatin  80 mg Oral Daily   carvedilol  6.25 mg Oral BID WC   ferrous gluconate  324 mg Oral Q breakfast   fluticasone  2 spray Each Nare Daily   levothyroxine   100 mcg Oral QAC breakfast   patiromer  16.8 g Oral Daily   senna-docusate  1 tablet Oral BID   acetaminophen, albuterol, alum & mag hydroxide-simeth, dextromethorphan-guaiFENesin, hydrALAZINE, ondansetron (ZOFRAN) IV, polyethylene glycol  Assessment/ Plan:  Mr. NIKO PENSON is a 57 y.o.  male with past medical conditions including hypertension, paroxysmal A-fib on Eliquis, CHF with a EF less than 20%, and chronic kidney disease stage IV. Patient presents to the emergency department with shortness of breath and increased lower extremity edema.   Acute Kidney Injury with hyponatremia on chronic kidney disease stage 4 with baseline creatinine 2.63 and GFR of 28 on 11/18/21.  Acute kidney injury secondary to prerenal azotemia.  And Chronic kidney disease is secondary to hypertension.  CT abdomen pelvis 4.5 cm cyst on the right lower pole, no obstruction.  Creatinine has returned to baseline.  Questionable urine output noted.  Patient states he continues to void regularly.  Continue to encourage hydration.  Lab Results  Component Value Date   CREATININE 2.85 (H) 03/03/2022   CREATININE 2.83 (H) 03/02/2022   CREATININE 3.07 (H) 03/01/2022    Intake/Output Summary (Last 24 hours) at 03/03/2022 1116 Last data filed at 03/02/2022 2100 Gross per 24 hour  Intake 546.2 ml  Output 300 ml  Net 246.2 ml    2. Anemia of chronic kidney disease Lab Results  Component Value Date   HGB 15.4 03/03/2022    Hemoglobin acceptable.  3. Chronic systolic and diastolic heart failure.  Echo from 12/08/2021 shows EF less than 20% with a moderate to severe mitral valve regurgitation.  Diuretics held.  4. 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 Home regimen includes Wilder Glade, held. Most recent hemoglobin A1c is 7.2 on 11/19/21.    5. Hypokalemia, likely due to kidney injury.  Potassium elevated, 5.3.  Will order Veltassa 16.8 mg once.   LOS: [place] 1/9/202411:16 AM

## 2022-03-04 ENCOUNTER — Ambulatory Visit: Payer: Medicaid Other | Admitting: Nurse Practitioner

## 2022-03-04 DIAGNOSIS — B338 Other specified viral diseases: Secondary | ICD-10-CM | POA: Diagnosis not present

## 2022-03-04 DIAGNOSIS — I5022 Chronic systolic (congestive) heart failure: Secondary | ICD-10-CM | POA: Diagnosis not present

## 2022-03-04 DIAGNOSIS — N179 Acute kidney failure, unspecified: Secondary | ICD-10-CM | POA: Diagnosis not present

## 2022-03-04 DIAGNOSIS — I480 Paroxysmal atrial fibrillation: Secondary | ICD-10-CM | POA: Diagnosis not present

## 2022-03-04 LAB — BASIC METABOLIC PANEL
Anion gap: 6 (ref 5–15)
BUN: 60 mg/dL — ABNORMAL HIGH (ref 6–20)
CO2: 20 mmol/L — ABNORMAL LOW (ref 22–32)
Calcium: 8.2 mg/dL — ABNORMAL LOW (ref 8.9–10.3)
Chloride: 107 mmol/L (ref 98–111)
Creatinine, Ser: 3.11 mg/dL — ABNORMAL HIGH (ref 0.61–1.24)
GFR, Estimated: 22 mL/min — ABNORMAL LOW (ref >60)
Glucose, Bld: 146 mg/dL — ABNORMAL HIGH (ref 70–99)
Potassium: 5 mmol/L (ref 3.5–5.1)
Sodium: 133 mmol/L — ABNORMAL LOW (ref 135–145)

## 2022-03-04 NOTE — TOC Progression Note (Signed)
Transition of Care [facility]) - Progression Note    Patient Details  Name: Hector [name] MRN: [id number] Date of Birth: May 06, 1965  Transition of Care [facility]) CM/SW Contact  Beverly Sessions, RN Phone Number: 03/04/2022, 9:06 AM  Clinical Narrative:    Per nephrology Creatinine at baseline .  No TOC needs identified.  Please consult if indicated         Expected Discharge Plan and Services                                               Social Determinants of Health (SDOH) Interventions SDOH Screenings   Food Insecurity: No Food Insecurity (02/25/2022)  Housing: Low Risk  (02/25/2022)  Transportation Needs: No Transportation Needs (02/25/2022)  Utilities: Not At Risk (02/25/2022)  Depression (PHQ2-9): Low Risk  (08/19/2021)  Financial Resource Strain: Low Risk  (01/04/2019)  Physical Activity: Inactive (01/04/2019)  Social Connections: Moderately Isolated (01/04/2019)  Stress: No Stress Concern Present (01/04/2019)  Tobacco Use: Medium Risk (02/23/2022)    Readmission Risk Interventions    02/24/2022    3:53 PM 12/09/2021    1:09 PM 10/14/2021    1:24 PM  Readmission Risk Prevention Plan  Transportation Screening Complete Complete Complete  PCP or Specialist Appt within 3-5 Days   Complete  Social Work Consult for [place] Planning/Counseling   Complete  Palliative Care Screening   Not Applicable  Medication Review Press photographer) Complete Complete Complete  PCP or Specialist appointment within 3-5 days of discharge  Complete   SW Recovery Care/Counseling Consult Complete Complete   Palliative Care Screening Not Applicable Not [place] Not Applicable Not Applicable

## 2022-03-04 NOTE — Progress Notes (Addendum)
Progress Note  Patient: Hector [name] LSL:[id number] DOB: 31-Jan-1966 DOA: 02/23/2022     9 DOS: the patient was seen and examined on 03/04/2022   Brief hospital course: Hector [name] is a 57 y.o. male with medical history significant of  A. fib on Eliquis, pancreatitis, sCHF with EF < 20%, CKD-IV, HTN, HLD, stroke, hypothyroidism, alcohol abuse in remission, GI bleeding, OSA, anemia, COVID infection 06/19/2020, who presents with nausea, vomiting, abdominal pain, generalized weakness.  Workup revealed acute on chronic renal failure and evidence of constipation.  Assessment and Plan: Principal Problem:   Acute renal failure superimposed on stage 4 chronic kidney disease (HCC) Active Problems:   RSV infection   Abdominal pain   Hyponatremia   Essential hypertension   Paroxysmal atrial fibrillation (HCC)   Chronic systolic CHF (congestive heart failure) (HCC)   Hypothyroidism   Stroke (HCC)   HLD (hyperlipidemia)   UTI (urinary tract infection)   Obesity (BMI 30-39.9)   Type II diabetes mellitus with renal manifestations (HCC)    Assessment and Plan: Acute renal failure superimposed on stage 4 chronic kidney disease ([place]):  Presented with creatinine 0.26.  Baseline creatinine 2.63 on 11/18/2021 Likely due to multifactorial etiology, including possible UTI, dehydration, and continuation of diuretics.   No hydronephrosis by CT scan.  Renal ultrasound also negative for obstruction -Nephrology following. -Gentle IV fluid: 40 cc/h of normal saline --continue given ongoing poor p.o. intake -Hold diuretics -Avoid using renal toxic medications. -Monitor renal function closely, improving slowly overall but has stabilized over the last 24 hours   RSV infection: No oxygen desaturation. -Albuterol and Mucinex -supportive care   Abdominal pain, resolved: Possibly due to constipation, abdominal pain and nausea have resolved.  CT scan of abdomen/pelvis is negative for acute  intra-abdominal issues.  Patient has mildly elevated lipase at 94, but no signs of pancreatitis by CT scan. He is having BMs now. -MiraLAX and senokot for constipation -As needed Zofran   Hyperkalemia - K 5.3 this AM, mildy elevated.  Repeat BMP in AM and treat if still elevated  Hyponatremia: Asymptomatic, relatively stable, up to 135 today.  Likely due to poor oral intake and dehydration, and continuation of diuretics use - IVF: 40 mL/h of normal saline -Continue IV fluids given poor p.o. intake ongoing   Essential hypertension: -IV hydralazine as needed -Coreg,   Paroxysmal atrial fibrillation ([place]): Heart rate is low this morning, asymptomatic. -Continue Eliquis, Coreg and amiodarone (Coreg and amiodarone held this morning until heart rate improves)   Chronic systolic CHF (congestive heart failure) ([place]): 2D echo on 12/08/2021 showed EF<20%.  BNP 669, but patient is clinically dry. 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  Blood sugar controlled at the time of admission.  Patient's not taking medications (supposed to take [place]) -SSI    DVT ppx: on  Eliquis   Code Status: Full code   Disposition Plan: Pending further clinical improvement, anticipate discharge in 1-2 more days.  P.o. intake remains poor and inadequate   Consults:   Nephrology      Subjective:  Patient was sleeping but woke easily to voice when seen on rounds this morning.  He reports ongoing productive cough but difficulty producing phlegm, shortness of breath with ambulating  to the bathroom.  Ongoing chills and profound fatigue.  Says not feeling better, has not been able to eat much of anything the past few days.  Reports making good urine output.  No acute complaints.   Physical Exam: Vitals:   03/03/22 1920 03/04/22 0303 03/04/22 0500 03/04/22 0719  BP: (!) 127/102 91/72  (!) 112/97  Pulse: 84 91  87  Resp: 20 20  18   Temp: 98 F (36.7 C) 97.8 F (36.6 C)  98.3 F (36.8 C)  TempSrc:  Oral  Oral  SpO2: 98% 92%    Weight:   111.9 kg   Height:       General exam: awake, appears fatigued, no acute distress, mildly ill-appearing HEENT: voice sounds congested, moist mucus membranes, hearing grossly normal  Respiratory system: CTAB but generally diminished throughout, no wheezes, normal respiratory effort, congested sounding cough. Cardiovascular system: normal S1/S2, RRR, no pedal edema.   Gastrointestinal system: soft, NT, ND, no HSM felt, +bowel sounds. Central nervous system: A&O x3. no gross focal neurologic deficits, normal speech Extremities: moves all, no edema, normal tone Skin: dry, intact, normal temperature, normal color,No rashes, lesions or ulcers Psychiatry: normal mood, congruent affect, judgement and insight appear normal    Data Reviewed: Notable labs ---     Time spent: 35 minutes  Author: Ezekiel Slocumb, DO 03/04/2022 1:19 PM  For on call review [email or web address].

## 2022-03-04 NOTE — Progress Notes (Signed)
  [facility]  ROUNDING NOTE   Subjective:   Hector [name] is a 57 y.o. male with past medical conditions including hypertension, paroxysmal A-fib on Eliquis, CHF with a EF less than 20%, and chronic kidney disease stage IV. Patient presents to the emergency department with nausea, vomiting and shortness of breath. He has been admitted for Dehydration [E86.0] Constipation, unspecified constipation type [K59.00] Acute renal failure superimposed on stage 4 chronic kidney disease, unspecified acute renal failure type ([place]) [N17.9, N18.4] Acute renal failure superimposed on chronic kidney disease, unspecified acute renal failure type, unspecified CKD stage ([place]) [N17.9, N18.9]  Patient is known to our practice from previous admissions and is currently followed by [facility] nephrology outpatient.    Creatinine stable, remains at baseline  Assessment/ Plan:  [name] is a 57 y.o.  male with past medical conditions including hypertension, paroxysmal A-fib on Eliquis, CHF with a EF less than 20%, and chronic kidney disease stage IV. Patient presents to the emergency department with shortness of breath and increased lower extremity edema.   Acute Kidney Injury with hyponatremia on chronic kidney disease stage 4 with baseline creatinine 2.63 and GFR of 28 on 11/18/21.  Acute kidney injury secondary to prerenal azotemia.  And Chronic kidney disease is secondary to hypertension.  CT abdomen pelvis 4.5 cm cyst on the right lower pole, no obstruction.    Creatinine at baseline. Will need hospital follow up with [facility] nephrology at discharge.  Lab Results  Component Value Date   CREATININE 2.85 (H) 03/03/2022   CREATININE 2.83 (H) 03/02/2022   CREATININE 3.07 (H) 03/01/2022    Intake/Output Summary (Last 24 hours) at 03/04/2022 0830 Last data filed at 03/04/2022 0752 Gross per 24 hour  Intake --  Output 550 ml  Net -550 ml    2. Anemia of chronic kidney disease Lab  Results  Component Value Date   HGB 15.4 03/03/2022    Hemoglobin stable  3. Chronic systolic and diastolic heart failure.  Echo from 12/08/2021 shows EF less than 20% with a moderate to severe mitral valve regurgitation.  Diuretics remain held.  4. 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 Home regimen includes Wilder Glade, held. Most recent hemoglobin A1c is 7.2 on 11/19/21.    5. Hypokalemia, likely due to kidney injury.  Veltassa ordered yesterday for potassium 5.3.   Due to stable renal function, we will sign off at this time. Thank you for including [place] on the treatment team.   LOS: 9 [place] 1/10/20248:30 AM

## 2022-03-04 NOTE — Progress Notes (Deleted)
Office Visit    Patient Name: Hector [name] Date of Encounter: 03/04/2022  Primary Care Provider:  [name], MD Primary Cardiologist:  Ida Rogue, MD  Chief Complaint    ***  Past Medical History    Past Medical History:  Diagnosis Date   Alcohol abuse    Alcoholic cardiomyopathy ([place])    a. 12/2007 MV: EF 28%, no isch;  b. 8/12 Echo: EF 25-35%; c. 02/2014 Echo: EF 20-25%; d. 12/2014 Cath: minimal CAD; e. 01/2015 Echo: EF 50-55%;  d. 05/2016 Echo: EF 30-35%, diff HK, gr2 DD; e. 11/2016 Echo: EF 45-50%, diff HK; f. 09/2021 Echo: EF <20%; g. 11/2021 Echo: EF<20%.   Chronic combined systolic (congestive) and diastolic (congestive) heart failure ([place])    a. 05/2016 Echo: EF 30-35%; b. 11/2016 Echo: EF 45-50%, diff HK; c. 09/2021 Echo: EF < 20%; d. 11/2021 Echo: EF <20%, no rwma, Nl RV fxn, sev dil LA, mod-sev MR. mod dil PA w/ mildly elev PASP.   CKD (chronic kidney disease), stage III (HCC)    Elevated troponin (chronic)    Essential hypertension    GI bleed 11/2013   Hyperlipidemia    Pancreatitis    Paroxysmal A-fib (HCC)    a. new onset s/p unsuccessful TEE/DCCV on 08/16/2014; b. CHA2DS2VASc = 3-> eliquis (freq noncompliant); c. 02/2021 s/p TEE/DCCV; d. Prev on amio->d/c 2/2 abnl TFTs.   Paroxysmal atrial flutter (HCC)    Sleep-disordered breathing    Has yet to have a sleep study   Stroke [facility])    Past Surgical History:  Procedure Laterality Date   CARDIAC CATHETERIZATION N/A 01/09/2015   Procedure: Left Heart Cath and Coronary Angiography;  Surgeon: [name], MD;  Location: [facility];  Service: Cardiovascular;  Laterality: N/A;   CARDIOVERSION N/A 03/05/2021   Procedure: CARDIOVERSION;  Surgeon: Kate Sable, MD;  Location: [facility];  Service: Cardiovascular;  Laterality: N/A;   COLONOSCOPY N/A 07/22/2020   Procedure: COLONOSCOPY;  Surgeon: Toledo, Benay Pike, MD;  Location: [facility] ENDOSCOPY;  Service: Gastroenterology;  Laterality:  N/A;   ELECTROPHYSIOLOGIC STUDY N/A 08/16/2014   Procedure: CARDIOVERSION;  Surgeon: [name], MD;  Location: [facility];  Service: Cardiovascular;  Laterality: N/A;   FLEXIBLE SIGMOIDOSCOPY N/A 10/10/2015   Procedure: FLEXIBLE SIGMOIDOSCOPY;  Surgeon: [name], MD;  Location: [facility] ENDOSCOPY;  Service: Endoscopy;  Laterality: N/A;   KNEE SURGERY Right    NM MYOVIEW LTD  November 2011   No ischemia or infarction. EF 50-55% ( no improvement from 2009 Myoview EF of 28%   TEE WITHOUT CARDIOVERSION N/A 08/16/2014   Procedure: TRANSESOPHAGEAL ECHOCARDIOGRAM (TEE);  Surgeon: [name], MD;  Location: [facility];  Service: Cardiovascular;  Laterality: N/A;   TEE WITHOUT CARDIOVERSION N/A 03/05/2021   Procedure: TRANSESOPHAGEAL ECHOCARDIOGRAM (TEE);  Surgeon: Kate Sable, MD;  Location: [facility];  Service: Cardiovascular;  Laterality: N/A;    Allergies  Allergies  Allergen Reactions   Esomeprazole Magnesium Other (See Comments)    Suspected interstitial nephritis 2018   Eggs Or Egg-Derived Products Rash    History of Present Illness    ***  Home Medications    No current facility-administered medications for this visit.   No current outpatient medications on file.   Facility-Administered Medications Ordered in Other Visits  Medication Dose Route Frequency Provider Last Rate Last Admin   0.9 %  sodium chloride infusion   Intravenous Continuous Duffy Bruce, MD 40 mL/hr at 03/02/22 1658  Infusion Verify at 03/02/22 1658   acetaminophen (TYLENOL) tablet 650 mg  650 mg Oral Q6H PRN Ivor Costa, MD       albuterol (PROVENTIL) (2.5 MG/3ML) 0.083% nebulizer solution 2 mL  2 mL Inhalation Q4H PRN Ivor Costa, MD       allopurinol (ZYLOPRIM) tablet 50 mg  50 mg Oral Daily Ivor Costa, MD   50 mg at 03/04/22 0821   alum & mag hydroxide-simeth (MAALOX/MYLANTA) 200-200-20 MG/5ML suspension 30 mL  30 mL Oral Q6H PRN Tomma Rakers, MD   30 mL at 03/03/22 1715    amiodarone (PACERONE) tablet 200 mg  200 mg Oral BID Ivor Costa, MD   200 mg at 03/04/22 [id number]   apixaban (ELIQUIS) tablet 5 mg  5 mg Oral BID Ivor Costa, MD   5 mg at 03/04/22 [id number]   atorvastatin (LIPITOR) tablet 80 mg  80 mg Oral Daily Ivor Costa, MD   80 mg at 03/04/22 [id number]   carvedilol (COREG) tablet 6.25 mg  6.25 mg Oral BID WC Hollice Gong, Mir Mohammed, MD   6.25 mg at 03/04/22 [id number]   dextromethorphan-guaiFENesin ([place] DM) 30-600 MG per 12 hr tablet 1 tablet  1 tablet Oral BID PRN Ivor Costa, MD   1 tablet at 03/01/22 2050   ferrous gluconate (FERGON) tablet 324 mg  324 mg Oral Q breakfast Ivor Costa, MD   324 mg at 03/04/22 0821   fluticasone (FLONASE) 50 MCG/ACT nasal spray 2 spray  2 spray Each Nare Daily Tomma Rakers, MD   2 spray at 03/04/22 [id number]   hydrALAZINE (APRESOLINE) injection 5 mg  5 mg Intravenous Q2H PRN Ivor Costa, MD       levothyroxine (SYNTHROID) tablet 100 mcg  100 mcg Oral QAC breakfast Ivor Costa, MD   100 mcg at 03/04/22 0534   ondansetron [facility]) injection 4 mg  4 mg Intravenous Q8H PRN Ivor Costa, MD   4 mg at 03/03/22 [id number]   patiromer (VELTASSA) packet 16.8 g  16.8 g Oral Daily Breeze, Benancio Deeds, NP   16.8 g at 03/03/22 1316   polyethylene glycol (MIRALAX / GLYCOLAX) packet 17 g  17 g Oral Daily PRN Ivor Costa, MD       senna-docusate (Senokot-S) tablet 1 tablet  1 tablet Oral BID Ivor Costa, MD   1 tablet at 02/27/22 [id number]     Review of Systems    ***.  All other systems reviewed and are otherwise negative except as noted above.    Physical Exam    VS:  There were no vitals taken for this visit. , BMI There is no height or weight on file to calculate BMI.     GEN: Well nourished, well developed, in no acute distress. HEENT: normal. Neck: Supple, no JVD, carotid bruits, or masses. Cardiac: RRR, no murmurs, rubs, or gallops. No clubbing, cyanosis, edema.  Radials 2+/PT 2+ and equal bilaterally.  Respiratory:  Respirations regular and unlabored,  clear to auscultation bilaterally. GI: Soft, nontender, nondistended, BS + x 4. MS: no deformity or atrophy. Skin: warm and dry, no rash. Neuro:  Strength and sensation are intact. Psych: Normal affect.  Accessory Clinical Findings    ECG personally reviewed by me today - *** - no acute changes.  Lab Results  Component Value Date   WBC 8.7 03/03/2022   HGB 15.4 03/03/2022   HCT 48.2 03/03/2022   MCV 84.1 03/03/2022   PLT 161 03/03/2022   Lab Results  Component Value Date  CREATININE 2.85 (H) 03/03/2022   BUN 53 (H) 03/03/2022   NA 132 (L) 03/03/2022   K 5.3 (H) 03/03/2022   CL 105 03/03/2022   CO2 19 (L) 03/03/2022   Lab Results  Component Value Date   ALT 28 02/23/2022   AST 38 02/23/2022   ALKPHOS 112 02/23/2022   BILITOT 1.9 (H) 02/23/2022   Lab Results  Component Value Date   CHOL 141 01/09/2022   HDL 25 (L) 01/09/2022   LDLCALC 88 01/09/2022   TRIG 140 01/09/2022   CHOLHDL 5.6 01/09/2022    Lab Results  Component Value Date   HGBA1C 6.8 (H) 01/07/2022    Assessment & Plan    1.  ***   Murray Hodgkins, NP 03/04/2022, 12:51 PM

## 2022-03-05 DIAGNOSIS — I5022 Chronic systolic (congestive) heart failure: Secondary | ICD-10-CM | POA: Diagnosis not present

## 2022-03-05 DIAGNOSIS — E871 Hypo-osmolality and hyponatremia: Secondary | ICD-10-CM

## 2022-03-05 DIAGNOSIS — B338 Other specified viral diseases: Secondary | ICD-10-CM | POA: Diagnosis not present

## 2022-03-05 DIAGNOSIS — I480 Paroxysmal atrial fibrillation: Secondary | ICD-10-CM | POA: Diagnosis not present

## 2022-03-05 LAB — BASIC METABOLIC PANEL
Anion gap: 7 (ref 5–15)
BUN: 58 mg/dL — ABNORMAL HIGH (ref 6–20)
CO2: 20 mmol/L — ABNORMAL LOW (ref 22–32)
Calcium: 7.9 mg/dL — ABNORMAL LOW (ref 8.9–10.3)
Chloride: 107 mmol/L (ref 98–111)
Creatinine, Ser: 3.03 mg/dL — ABNORMAL HIGH (ref 0.61–1.24)
GFR, Estimated: 23 mL/min — ABNORMAL LOW (ref >60)
Glucose, Bld: 104 mg/dL — ABNORMAL HIGH (ref 70–99)
Potassium: 4.1 mmol/L (ref 3.5–5.1)
Sodium: 134 mmol/L — ABNORMAL LOW (ref 135–145)

## 2022-03-05 MED ORDER — TORSEMIDE 20 MG PO TABS: 60.0000 mg | ORAL_TABLET | Freq: Every day | ORAL | 0 refills | Status: DC

## 2022-03-05 NOTE — Discharge Summary (Signed)
Physician Discharge Summary   Patient: Hector [name] MRN: [id number] DOB: 11/28/1965  Admit date:     02/23/2022  Discharge date: 03/06/22  Discharge Physician: Ezekiel Slocumb   PCP: [name], MD   Recommendations at discharge:    Follow up with [facility] in 1 week Repeat BMP, Mg, CBC in 1 week   Discharge Diagnoses: Principal Problem:   Acute renal failure superimposed on stage 4 chronic kidney disease ([place]) Active Problems:   RSV infection   Abdominal pain   Hyponatremia   Essential hypertension   Paroxysmal atrial fibrillation (HCC)   Chronic systolic CHF (congestive heart failure) (HCC)   Hypothyroidism   Stroke ([place])   HLD (hyperlipidemia)   UTI (urinary tract infection)   Obesity (BMI 30-39.9)   Type II diabetes mellitus with renal manifestations ([place])  Resolved Problems:   * No resolved hospital problems. *  Hospital Course:  Hector [name] is a 57 y.o. male with medical history significant of  A. fib on Eliquis, pancreatitis, sCHF with EF < 20%, CKD-IV, HTN, HLD, stroke, hypothyroidism, alcohol abuse in remission, GI bleeding, OSA, anemia, COVID infection 06/19/2020, who presents with nausea, vomiting, abdominal pain, generalized weakness.  Workup revealed acute on chronic renal failure and evidence of constipation.   Nephrology was consulted.  Diuretics were held and gentle IV hydration given. Renal function improved to baseline.  Patient was also treated with supportive care for RSV infection with bronchodilators and mucolytics. He has not been hypoxic and is clinically improving. Has mild persistent cough.   Further hospital course and management as outlined below.  1/11 - patient is clinically improved and stable for discharge today.   He will need to closely follow up with his [facility] nephrology team and he expresses understanding and agreement  .   Assessment and Plan:  Acute renal failure superimposed on stage 4 chronic  kidney disease [facility]):  Presented with creatinine 0.26.  Baseline creatinine 2.63 on 11/18/2021 Likely due to multifactorial etiology, including possible UTI, dehydration, and continuation of diuretics.   No hydronephrosis by CT scan.  Renal ultrasound also negative for obstruction -Nephrology following. -Treated with gentle IV fluid: 40 cc/h of normal saline --stopped -Hold diuretics -- resume at discharge -Avoid using renal toxic medications. -Monitor renal function closely, improving slowly overall but has stabilized over the last 24 hours -Close Nephrology follow up at [facility] -Repeat BMP in 1 week    RSV infection: No oxygen desaturation. -Albuterol and Mucinex -supportive care   Abdominal pain, resolved: Possibly due to constipation, abdominal pain and nausea have resolved.   CT scan of abdomen/pelvis is negative for acute intra-abdominal issues.   Patient has mildly elevated lipase at 94, but no signs of pancreatitis by CT scan.  He is having BMs now.  No longer complains of abdominal pain. -Given MiraLAX and senokot for constipation -As needed Zofran - has not required recently   Hyperkalemia - K normal this AM.  Has been given Veltassa but will not continue at discharge to avoid causing hypokalemia with resuming diuretics.  Repeat BMP in 1 week   Hyponatremia: Resolved with IV hydration Likely due to poor oral intake and dehydration, and continuation of diuretics use - Stop IV Fluids: 40 mL/h of normal saline - Repeat BMP in 1 week   Essential hypertension: -IV hydralazine as needed -Coreg,   Paroxysmal atrial fibrillation ([place]): HR controlled. -Continue Eliquis, Coreg and amiodarone    Chronic systolic CHF (congestive heart failure) ([place]):  2D echo on 12/08/2021 showed EF<20%.  BNP 669, but patient is clinically dry. -Held diuretics due to worsened renal function  -Continue daily weights at home -Resume diuretics at discharge   Hypothyroidism -Synthroid   Stroke  ([place]) -On Eliquis for A-fib -Lipitor   HLD (hyperlipidemia) -Lipitor   UTI (urinary tract infection) - abnormal UA, but no UTI symptoms.  -Negative urine culture. Stopped antibiotics   Obesity (BMI 30-39.9): BMI 33.19, body weight 108 kg -Exercise and healthy diet -Encourage losing weight       Consultants: Nephrology Procedures performed: None   Disposition: Home  Diet recommendation:  Cardiac diet & Carb Modified   DISCHARGE MEDICATION: Allergies as of 03/05/2022       Reactions   Esomeprazole Magnesium Other (See Comments)   Suspected interstitial nephritis 2018   Eggs Or Egg-derived Products Rash        Medication List     TAKE these medications    allopurinol 100 MG tablet Commonly known as: ZYLOPRIM Take 0.5 tablets (50 mg total) by mouth daily.   amiodarone 200 MG tablet Commonly known as: PACERONE Take 1 tablet (200 mg total) by mouth 2 (two) times daily.   apixaban 5 MG Tabs tablet Commonly known as: ELIQUIS Take 1 tablet (5 mg total) by mouth 2 (two) times daily.   atorvastatin 80 MG tablet Commonly known as: LIPITOR Take 1 tablet (80 mg total) by mouth daily.   carvedilol 6.25 MG tablet Commonly known as: COREG Take 1 tablet (6.25 mg total) by mouth 2 (two) times daily with a meal.   dapagliflozin propanediol 10 MG Tabs tablet Commonly known as: Farxiga Take 1 tablet (10 mg total) by mouth daily before breakfast.   ferrous gluconate 324 MG tablet Commonly known as: FERGON Take 324 mg by mouth daily with breakfast.   levothyroxine 100 MCG tablet Commonly known as: SYNTHROID Take 1 tablet (100 mcg total) by mouth daily before breakfast.   metolazone 2.5 MG tablet Commonly known as: ZAROXOLYN Take 1 tablet (2.5 mg total) by mouth daily. What changed:  when to take this additional instructions   potassium chloride SA 20 MEQ tablet Commonly known as: KLOR-CON M Take 1 tablet (20 mEq total) by mouth daily. Take an extra 2 tablets  on Mon and Fri with metolazone. What changed: additional instructions   torsemide 20 MG tablet Commonly known as: DEMADEX Take 3 tablets (60 mg total) by mouth daily. What changed: when to take this        Discharge Exam: Filed Weights   03/03/22 0500 03/04/22 0500 03/05/22 1211  Weight: 111.3 kg 111.9 kg 116.1 kg   General exam: awake, alert, no acute distress, obese HEENT: atraumatic, clear conjunctiva, anicteric sclera, moist mucus membranes, hearing grossly normal  Respiratory system: CTAB, no wheezes, rales or rhonchi, normal respiratory effort. Cardiovascular system: normal S1/S2, RRR, no JVD, murmurs, rubs, gallops, no peripheral edema.   Gastrointestinal system: soft, NT, ND, no HSM felt, +bowel sounds. Central nervous system: A&O x3. no gross focal neurologic deficits, normal speech Extremities: moves all, no edema, normal tone Skin: dry, intact, normal temperature, normal color, No rashes, lesions or ulcers Psychiatry: normal mood, congruent affect, judgement and insight appear normal   Condition at discharge: stable  The results of significant diagnostics from this hospitalization (including imaging, microbiology, ancillary and laboratory) are listed below for reference.   Imaging Studies: DG Chest 2 View  Result Date: 02/23/2022 CLINICAL DATA:  Shortness of breath.  Cold symptoms. EXAM:  CHEST - 2 VIEW COMPARISON:  Radiograph 01/24/2022. Lung bases from abdominopelvic CT earlier today FINDINGS: Mild cardiomegaly. Unchanged mediastinal contours. Tortuous thoracic aorta. Mild diffuse peribronchial thickening. Question of subtle septal thickening in the bases. No confluent airspace disease. No pleural fluid. No pneumothorax. No acute osseous findings. IMPRESSION: Mild cardiomegaly. Mild diffuse peribronchial thickening. Question of subtle septal thickening in the bases, can be seen with pulmonary edema. Electronically Signed   By: Keith Rake M.D.   On: 02/23/2022 12:24    CT ABDOMEN PELVIS WO CONTRAST  Result Date: 02/23/2022 CLINICAL DATA:  57 year old male with history of acute onset of nonlocalized abdominal pain. EXAM: CT ABDOMEN AND PELVIS WITHOUT CONTRAST TECHNIQUE: Multidetector CT imaging of the abdomen and pelvis was performed following the standard protocol without IV contrast. RADIATION DOSE REDUCTION: This exam was performed according to the departmental dose-optimization program which includes automated exposure control, adjustment of the mA and/or kV according to patient size and/or use of iterative reconstruction technique. COMPARISON:  CT of the abdomen and pelvis 10/14/2021. FINDINGS: Lower chest: Small calcified granuloma in the right lower lobe. Patchy areas of ground-glass attenuation noted in the lung bases bilaterally, nonspecific, but similar to prior study. Mild cardiomegaly. Hepatobiliary: No definite suspicious cystic or solid hepatic lesions are confidently identified on today's noncontrast CT examination. Tiny calcified gallstones lying dependently in the gallbladder. Gallbladder is mildly distended. Gallbladder wall does not appear thickened. No pericholecystic fluid or surrounding inflammatory changes. Pancreas: Mild diffuse fatty atrophy of the pancreas. No pancreatic mass. No pancreatic ductal dilatation. No pancreatic or peripancreatic fluid collections or inflammatory changes. Spleen: Unremarkable. Adrenals/Urinary Tract: Multiple low-attenuation renal lesions bilaterally, incompletely characterized on today's noncontrast CT examination, but statistically likely to represent cysts, measuring up to 4.5 cm in diameter in the lateral aspect of the lower pole of the right kidney. In addition, in the posterior aspect of the interpolar region of the left kidney there are 2 high attenuation lesions, largest of which measures 2.1 cm (axial image 29 of series 2). No hydroureteronephrosis. Urinary bladder is normal in appearance. Bilateral adrenal glands  are normal in appearance. Stomach/Bowel: Unenhanced appearance of the stomach is normal. No pathologic dilatation of small bowel or colon. Numerous colonic diverticuli are noted, without surrounding inflammatory changes to suggest an acute diverticulitis at this time. Normal appendix. Large volume of well-formed stool in the rectal vault, which could suggest mild constipation. Vascular/Lymphatic: Atherosclerotic calcifications in the abdominal aorta and pelvic vasculature. No lymphadenopathy noted in the abdomen or pelvis. Reproductive: Prostate gland and seminal vesicles are unremarkable in appearance. Other: Small umbilical hernia containing only omental fat. No significant volume of ascites. No pneumoperitoneum. Musculoskeletal: There are no aggressive appearing lytic or blastic lesions noted in the visualized portions of the skeleton. IMPRESSION: 1. No definite acute findings are noted in the abdomen or pelvis. 2. There is a large volume of well-formed stool in the rectal vault, which could suggest constipation. 3. Small umbilical hernia containing only omental fat. No associated bowel incarceration or obstruction at this time. 4. Cholelithiasis without evidence of acute cholecystitis. 5. Colonic diverticulosis without evidence of acute diverticulitis at this time. 6. Aortic atherosclerosis. 7. Additional incidental findings, as above. Electronically Signed   By: Vinnie Langton M.D.   On: 02/23/2022 12:18    Microbiology: Results for orders placed or performed during the hospital encounter of 02/23/22  Resp panel by RT-PCR (RSV, Flu A&B, Covid) Anterior Nasal Swab     Status: Abnormal   Collection Time: 02/23/22  9:20 AM   Specimen: Anterior Nasal Swab  Result Value Ref Range Status   SARS Coronavirus 2 by RT PCR NEGATIVE NEGATIVE Final    Comment: (NOTE) SARS-CoV-2 target nucleic acids are NOT DETECTED.  The SARS-CoV-2 RNA is generally detectable in upper respiratory specimens during the acute  phase of infection. The lowest concentration of SARS-CoV-2 viral copies this assay can detect is 138 copies/mL. A negative result does not preclude SARS-Cov-2 infection and should not be used as the sole basis for treatment or other patient management decisions. A negative result may occur with  improper specimen collection/handling, submission of specimen other than nasopharyngeal swab, presence of viral mutation(s) within the areas targeted by this assay, and inadequate number of viral copies(<138 copies/mL). A negative result must be combined with clinical observations, patient history, and epidemiological information. The expected result is Negative.  Fact Sheet for Patients:  EntrepreneurPulse.com.au  Fact Sheet for Healthcare Providers:  IncredibleEmployment.be  This test is no t yet approved or cleared by the [place] FDA and  has been authorized for detection and/or diagnosis of SARS-CoV-2 by FDA under an Emergency Use Authorization (EUA). This EUA will remain  in effect (meaning this test can be used) for the duration of the COVID-19 declaration under Section 564(b)(1) of the Act, 21 U.S.C.section 360bbb-3(b)(1), unless the authorization is terminated  or revoked sooner.       Influenza A by PCR NEGATIVE NEGATIVE Final   Influenza B by PCR NEGATIVE NEGATIVE Final    Comment: (NOTE) The Xpert Xpress SARS-CoV-2/FLU/RSV plus assay is intended as an aid in the diagnosis of influenza from Nasopharyngeal swab specimens and should not be used as a sole basis for treatment. Nasal washings and aspirates are unacceptable for Xpert Xpress SARS-CoV-2/FLU/RSV testing.  Fact Sheet for Patients: EntrepreneurPulse.com.au  Fact Sheet for Healthcare Providers: IncredibleEmployment.be  This test is not yet approved or cleared by the [place] FDA and has been authorized for detection and/or diagnosis of  SARS-CoV-2 by FDA under an Emergency Use Authorization (EUA). This EUA will remain in effect (meaning this test can be used) for the duration of the COVID-19 declaration under Section 564(b)(1) of the Act, 21 U.S.C. section 360bbb-3(b)(1), unless the authorization is terminated or revoked.     Resp Syncytial Virus by PCR POSITIVE (A) NEGATIVE Final    Comment: (NOTE) Fact Sheet for Patients: EntrepreneurPulse.com.au  Fact Sheet for Healthcare Providers: IncredibleEmployment.be  This test is not yet approved or cleared by the [place] FDA and has been authorized for detection and/or diagnosis of SARS-CoV-2 by FDA under an Emergency Use Authorization (EUA). This EUA will remain in effect (meaning this test can be used) for the duration of the COVID-19 declaration under Section 564(b)(1) of the Act, 21 U.S.C. section 360bbb-3(b)(1), unless the authorization is terminated or revoked.  Performed at [facility], [address], [place], [place] [address]   Urine Culture     Status: Abnormal   Collection Time: 02/23/22 11:08 AM   Specimen: Urine, Clean Catch  Result Value Ref Range Status   Specimen Description   Final    URINE, CLEAN CATCH Performed at [facility], [address], [place], [place] [address]    Special Requests   Final    NONE Performed at [facility], [address] [place], [place] [address]    Culture (A)  Final    <10,000 COLONIES/mL INSIGNIFICANT GROWTH Performed at [facility] [address] [place], [place]  [address]    Report Status 02/25/2022 FINAL  Final    Labs: CBC: Recent Labs  Lab 02/28/22 0350 03/01/22 0336 03/02/22 0259 03/03/22 0311  WBC 6.0 6.6 7.7 8.7  HGB 14.0 13.6 13.6 15.4  HCT 44.5 43.2 43.3 48.2  MCV 84.0 83.9 84.1 84.1  PLT 135* 152 140* 847   Basic Metabolic Panel: Recent Labs  Lab 03/01/22 0336 03/02/22 0259 03/03/22 0311  03/04/22 1420 03/05/22 0258  NA 135 135 132* 133* 134*  K 4.2 4.5 5.3* 5.0 4.1  CL 104 106 105 107 107  CO2 22 21* 19* 20* 20*  GLUCOSE 149* 149* 126* 146* 104*  BUN 60* 52* 53* 60* 58*  CREATININE 3.07* 2.83* 2.85* 3.11* 3.03*  CALCIUM 8.0* 8.1* 8.4* 8.2* 7.9*   Liver Function Tests: No results for input(s): "AST", "ALT", "ALKPHOS", "BILITOT", "PROT", "ALBUMIN" in the last 168 hours. CBG: No results for input(s): "GLUCAP" in the last 168 hours.  Discharge time spent: less than 30 minutes.  Signed: Ezekiel Slocumb, DO [facility] 03/06/2022

## 2022-03-05 NOTE — Plan of Care (Signed)

## 2022-03-05 NOTE — Progress Notes (Signed)
Discharge instructions were reviewed with pt. Questions were answered. IV was taken out. Belongings were collected by pt.

## 2022-03-23 ENCOUNTER — Other Ambulatory Visit: Payer: Self-pay

## 2022-03-23 ENCOUNTER — Emergency Department
Admission: EM | Admit: 2022-03-23 | Discharge: 2022-03-23 | Disposition: A | Discharge status: Home / Self Care | Payer: Medicaid Other | Attending: Emergency Medicine | Admitting: Emergency Medicine

## 2022-03-23 DIAGNOSIS — M109 Gout, unspecified: Secondary | ICD-10-CM | POA: Diagnosis not present

## 2022-03-23 DIAGNOSIS — N189 Chronic kidney disease, unspecified: Secondary | ICD-10-CM | POA: Diagnosis not present

## 2022-03-23 DIAGNOSIS — M25521 Pain in right elbow: Secondary | ICD-10-CM | POA: Diagnosis present

## 2022-03-23 DIAGNOSIS — Z7901 Long term (current) use of anticoagulants: Secondary | ICD-10-CM | POA: Diagnosis not present

## 2022-03-23 LAB — CBC WITH DIFFERENTIAL/PLATELET
Abs Immature Granulocytes: 0.05 10*3/uL (ref 0.00–0.07)
Basophils Absolute: 0.1 10*3/uL (ref 0.0–0.1)
Basophils Relative: 1 %
Eosinophils Absolute: 0.1 10*3/uL (ref 0.0–0.5)
Eosinophils Relative: 1 %
HCT: 52.3 % — ABNORMAL HIGH (ref 39.0–52.0)
Hemoglobin: 16.5 g/dL (ref 13.0–17.0)
Immature Granulocytes: 1 %
Lymphocytes Relative: 16 %
Lymphs Abs: 1.7 10*3/uL (ref 0.7–4.0)
MCH: 26.7 pg (ref 26.0–34.0)
MCHC: 31.5 g/dL (ref 30.0–36.0)
MCV: 84.6 fL (ref 80.0–100.0)
Monocytes Absolute: 0.9 10*3/uL (ref 0.1–1.0)
Monocytes Relative: 9 %
Neutro Abs: 7.4 10*3/uL (ref 1.7–7.7)
Neutrophils Relative %: 72 %
Platelets: 197 10*3/uL (ref 150–400)
RBC: 6.18 MIL/uL — ABNORMAL HIGH (ref 4.22–5.81)
RDW: 19.4 % — ABNORMAL HIGH (ref 11.5–15.5)
Smear Review: NORMAL
WBC: 10.2 10*3/uL (ref 4.0–10.5)
nRBC: 0 % (ref 0.0–0.2)

## 2022-03-23 LAB — BASIC METABOLIC PANEL
Anion gap: 13 (ref 5–15)
BUN: 43 mg/dL — ABNORMAL HIGH (ref 6–20)
CO2: 28 mmol/L (ref 22–32)
Calcium: 8.8 mg/dL — ABNORMAL LOW (ref 8.9–10.3)
Chloride: 96 mmol/L — ABNORMAL LOW (ref 98–111)
Creatinine, Ser: 2.94 mg/dL — ABNORMAL HIGH (ref 0.61–1.24)
GFR, Estimated: 24 mL/min — ABNORMAL LOW (ref >60)
Glucose, Bld: 116 mg/dL — ABNORMAL HIGH (ref 70–99)
Potassium: 4.9 mmol/L (ref 3.5–5.1)
Sodium: 137 mmol/L (ref 135–145)

## 2022-03-23 LAB — BRAIN NATRIURETIC PEPTIDE: B Natriuretic Peptide: 736.5 pg/mL — ABNORMAL HIGH (ref 0.0–100.0)

## 2022-03-23 LAB — CK: Total CK: 2671 U/L — ABNORMAL HIGH (ref 49–397)

## 2022-03-23 MED ORDER — TRAMADOL HCL 50 MG PO TABS: 50.0000 mg | ORAL_TABLET | Freq: Four times a day (QID) | ORAL | 0 refills | Status: DC | PRN

## 2022-03-23 MED ORDER — HYDROCODONE-ACETAMINOPHEN 5-325 MG PO TABS
2.0000 | ORAL_TABLET | Freq: Once | ORAL | Status: AC
  Administered 2022-03-23: 2 via ORAL
  Filled 2022-03-23: qty 2

## 2022-03-23 NOTE — ED Provider Notes (Signed)
[facility] Provider Note    Event Date/Time   First MD Initiated Contact with Patient 03/23/22 [phone]     (approximate)   History   Arm Pain   HPI  EMMITTE SURGEON is a 57 y.o. male with a history of alcoholic cardiomyopathy, CKD, atrial fibrillation on anticoagulation who presents with complaints of right elbow pain.  Patient reports that his right elbow has been bothering him for about 2 weeks now.  He denies redness, no fevers.  Has not taken anything for this.     Physical Exam   Triage Vital Signs: ED Triage Vitals  Enc Vitals Group     BP 03/23/22 0551 (!) 139/115     Pulse Rate 03/23/22 0551 (!) 103     Resp 03/23/22 0551 (!) 22     Temp 03/23/22 0551 97.7 F (36.5 C)     Temp Source 03/23/22 0551 Oral     SpO2 03/23/22 0539 96 %     Weight 03/23/22 0543 112 kg (247 lb)     Height 03/23/22 0543 1.803 m (5\' 11" )     Head Circumference --      Peak Flow --      Pain Score --      Pain Loc --      Pain Edu? --      Excl. in [place]? --     Most recent vital signs: Vitals:   03/23/22 0700 03/23/22 0730  BP: (!) 135/111 (!) 128/101  Pulse: (!) 101 72  Resp: 17   Temp:    SpO2: 93% 94%     General: Awake, no distress.  CV:  Good peripheral perfusion.  Resp:  Normal effort.  Abd:  No distention.  Other:  Right arm: No significant swelling of the arm or elbow, no erythema, no evidence of bursitis, some discomfort with extension   ED Results / Procedures / Treatments   Labs (all labs ordered are listed, but only abnormal results are displayed) Labs Reviewed  CBC WITH DIFFERENTIAL/PLATELET - Abnormal; Notable for the following components:      Result Value   RBC 6.18 (*)    HCT 52.3 (*)    RDW 19.4 (*)    All other components within normal limits  BASIC METABOLIC PANEL - Abnormal; Notable for the following components:   Chloride 96 (*)    Glucose, Bld 116 (*)    BUN 43 (*)    Creatinine, Ser 2.94 (*)    Calcium 8.8 (*)     GFR, Estimated 24 (*)    All other components within normal limits  CK - Abnormal; Notable for the following components:   Total CK 2,671 (*)    All other components within normal limits  BRAIN NATRIURETIC PEPTIDE - Abnormal; Notable for the following components:   B Natriuretic Peptide 736.5 (*)    All other components within normal limits     EKG  ED ECG REPORT I, Lavonia Drafts, the attending physician, personally viewed and interpreted this ECG.  Date: 03/23/2022  Rhythm: Atrial fibrillation QRS Axis: normal Intervals: Abnormal ST/T Wave abnormalities: normal Narrative Interpretation: no evidence of acute ischemia    RADIOLOGY     PROCEDURES:  Critical Care performed:   Procedures   MEDICATIONS ORDERED IN ED: Medications  HYDROcodone-acetaminophen (NORCO/VICODIN) 5-325 MG per tablet 2 tablet (2 tablets Oral Given 03/23/22 0641)     IMPRESSION / MDM / ASSESSMENT AND PLAN / ED COURSE  I  reviewed the triage vital signs and the nursing notes. Patient's presentation is most consistent with acute complicated illness / injury requiring diagnostic workup.  Patient presents with complaints of right elbow pain for about 2 weeks as described above.  He reports his breathing is at baseline and he has been taking his diuretics as prescribed.  He reports has been doing well since he was out of the hospital 2 weeks ago.  Medical records reviewed, patient discharged from the hospital for CHF exacerbation on January 11.  No evidence of infection of the right elbow, patient does have a history of gout this is certainly a possibility, no evidence of bursitis.  Review of record demonstrates the patient did have elbow tap performed on January 14, 2021 which was consistent with gout arthropathy, he reports his symptoms today feel similar to that  No significant fluid collection noted, afebrile, no erythema, not consistent with septic joint, able to extend and flex without  significant pain  Will check labs, analgesia provided  Lab work reviewed overall unremarkable, mild elevation of CK, not consistent with rhabdomyolysis, as only his elbow is hurting him, given his history presentation most consistent with gout arthropathy  Indication for admission at this time, will treat with analgesics, outpatient follow-up as needed          FINAL CLINICAL IMPRESSION(S) / ED DIAGNOSES   Final diagnoses:  Gout, arthropathy     Rx / DC Orders   ED Discharge Orders     None        Note:  This document was prepared using Dragon voice recognition software and may include unintentional dictation errors.   Lavonia Drafts, MD 03/23/22 [phone]

## 2022-03-23 NOTE — ED Provider Triage Note (Signed)
Emergency Medicine Provider Triage Evaluation Note  Hector [name] , a 57 y.o. male  was evaluated in triage.  Pt complains of right arm pain.  No trauma.  States he feels like he has too much fluid in his arm and that it might need to be drained with a needle.  Review of Systems  Positive: pain in right arm from elbow down to hand.   Negative: Chest pain, nausea acute shortness of breath (always has a degree of dyspnea at baseline), abdominal pain.  Physical Exam  BP (!) 139/115 (BP Location: Left Arm)   Pulse (!) 103   Temp 97.7 F (36.5 C) (Oral)   Resp (!) 22   Ht 1.803 m (5\' 11" )   Wt 112 kg   SpO2 97%   BMI 34.45 kg/m  Gen:   Awake, appears chronically ill but not in pain or respiratory distress at this time. Resp:  Normal effort.  Speaking easily comfortably without any difficulty and not using accessory muscles. MSK:   Patient reports pain and tenderness with palpation of the right forearm.  Compartments are soft and easily compressible and there is no edema.  He has an easily palpable right radial pulse.  No gross deformities.  Medical Decision Making  Medically screening exam initiated at 6:38 AM.  Appropriate orders placed.  Hector [name] was informed that the remainder of the evaluation will be completed by another provider, this initial triage assessment does not replace that evaluation, and the importance of remaining in the ED until their evaluation is complete.  Vitals are notable for mild tachypnea and mild tachycardia and hypertension (most notably diastolic).  Ordered basic labs for a CHF patient, holding off on imaging given no acute dyspnea and no clear indication for imaging of the right forearm.   Hector Kehr, MD 03/23/22 [phone]

## 2022-03-23 NOTE — ED Triage Notes (Signed)
States he has had right arm pain for several days and it has been getting worse where he can not sleep. States he also feels like he has fluid on him, States it is hard to lay back.

## 2022-04-06 ENCOUNTER — Other Ambulatory Visit: Payer: Self-pay | Admitting: Nurse Practitioner

## 2022-04-06 MED ORDER — CANAGLIFLOZIN 100 MG PO TABS: 100.0000 mg | ORAL_TABLET | Freq: Every day | ORAL | 1 refills | Status: DC

## 2022-04-06 NOTE — Progress Notes (Signed)
Farxiga written at DC not covered by insurance so dc'd in favor of Invokana 100 mg daily.

## 2022-04-13 ENCOUNTER — Encounter: Payer: Medicaid Other | Admitting: Internal Medicine

## 2022-04-25 ENCOUNTER — Emergency Department: Payer: Medicaid Other

## 2022-04-25 ENCOUNTER — Other Ambulatory Visit: Payer: Self-pay

## 2022-04-25 ENCOUNTER — Inpatient Hospital Stay
Admission: EM | Admit: 2022-04-25 | Discharge: 2022-05-04 | DRG: 286 | Disposition: A | Discharge status: Home / Self Care | Payer: Medicaid Other | Attending: Internal Medicine | Admitting: Internal Medicine

## 2022-04-25 DIAGNOSIS — G4733 Obstructive sleep apnea (adult) (pediatric): Secondary | ICD-10-CM | POA: Diagnosis present

## 2022-04-25 DIAGNOSIS — K8020 Calculus of gallbladder without cholecystitis without obstruction: Secondary | ICD-10-CM | POA: Diagnosis present

## 2022-04-25 DIAGNOSIS — Z79890 Hormone replacement therapy (postmenopausal): Secondary | ICD-10-CM

## 2022-04-25 DIAGNOSIS — D696 Thrombocytopenia, unspecified: Secondary | ICD-10-CM | POA: Diagnosis present

## 2022-04-25 DIAGNOSIS — Z8673 Personal history of transient ischemic attack (TIA), and cerebral infarction without residual deficits: Secondary | ICD-10-CM

## 2022-04-25 DIAGNOSIS — I428 Other cardiomyopathies: Secondary | ICD-10-CM

## 2022-04-25 DIAGNOSIS — I5043 Acute on chronic combined systolic (congestive) and diastolic (congestive) heart failure: Secondary | ICD-10-CM | POA: Diagnosis present

## 2022-04-25 DIAGNOSIS — Z83438 Family history of other disorder of lipoprotein metabolism and other lipidemia: Secondary | ICD-10-CM

## 2022-04-25 DIAGNOSIS — N184 Chronic kidney disease, stage 4 (severe): Secondary | ICD-10-CM | POA: Diagnosis present

## 2022-04-25 DIAGNOSIS — E785 Hyperlipidemia, unspecified: Secondary | ICD-10-CM | POA: Diagnosis present

## 2022-04-25 DIAGNOSIS — R112 Nausea with vomiting, unspecified: Secondary | ICD-10-CM | POA: Diagnosis present

## 2022-04-25 DIAGNOSIS — R1013 Epigastric pain: Secondary | ICD-10-CM

## 2022-04-25 DIAGNOSIS — E1122 Type 2 diabetes mellitus with diabetic chronic kidney disease: Secondary | ICD-10-CM | POA: Diagnosis present

## 2022-04-25 DIAGNOSIS — I4891 Unspecified atrial fibrillation: Secondary | ICD-10-CM | POA: Diagnosis present

## 2022-04-25 DIAGNOSIS — Z7901 Long term (current) use of anticoagulants: Secondary | ICD-10-CM

## 2022-04-25 DIAGNOSIS — Z91148 Patient's other noncompliance with medication regimen for other reason: Secondary | ICD-10-CM

## 2022-04-25 DIAGNOSIS — I4819 Other persistent atrial fibrillation: Secondary | ICD-10-CM | POA: Diagnosis present

## 2022-04-25 DIAGNOSIS — I130 Hypertensive heart and chronic kidney disease with heart failure and stage 1 through stage 4 chronic kidney disease, or unspecified chronic kidney disease: Principal | ICD-10-CM | POA: Diagnosis present

## 2022-04-25 DIAGNOSIS — Z91012 Allergy to eggs: Secondary | ICD-10-CM

## 2022-04-25 DIAGNOSIS — E871 Hypo-osmolality and hyponatremia: Secondary | ICD-10-CM | POA: Diagnosis present

## 2022-04-25 DIAGNOSIS — I426 Alcoholic cardiomyopathy: Secondary | ICD-10-CM | POA: Diagnosis present

## 2022-04-25 DIAGNOSIS — I509 Heart failure, unspecified: Secondary | ICD-10-CM

## 2022-04-25 DIAGNOSIS — N179 Acute kidney failure, unspecified: Secondary | ICD-10-CM | POA: Diagnosis present

## 2022-04-25 DIAGNOSIS — I480 Paroxysmal atrial fibrillation: Secondary | ICD-10-CM | POA: Diagnosis present

## 2022-04-25 DIAGNOSIS — F1011 Alcohol abuse, in remission: Secondary | ICD-10-CM | POA: Diagnosis present

## 2022-04-25 DIAGNOSIS — Z8719 Personal history of other diseases of the digestive system: Secondary | ICD-10-CM

## 2022-04-25 DIAGNOSIS — K7030 Alcoholic cirrhosis of liver without ascites: Secondary | ICD-10-CM

## 2022-04-25 DIAGNOSIS — I2510 Atherosclerotic heart disease of native coronary artery without angina pectoris: Secondary | ICD-10-CM | POA: Diagnosis present

## 2022-04-25 DIAGNOSIS — K2289 Other specified disease of esophagus: Secondary | ICD-10-CM | POA: Diagnosis present

## 2022-04-25 DIAGNOSIS — E1129 Type 2 diabetes mellitus with other diabetic kidney complication: Secondary | ICD-10-CM | POA: Diagnosis present

## 2022-04-25 DIAGNOSIS — E039 Hypothyroidism, unspecified: Secondary | ICD-10-CM | POA: Diagnosis present

## 2022-04-25 DIAGNOSIS — I5023 Acute on chronic systolic (congestive) heart failure: Secondary | ICD-10-CM | POA: Diagnosis present

## 2022-04-25 DIAGNOSIS — Z1152 Encounter for screening for COVID-19: Secondary | ICD-10-CM

## 2022-04-25 DIAGNOSIS — Z888 Allergy status to other drugs, medicaments and biological substances status: Secondary | ICD-10-CM

## 2022-04-25 DIAGNOSIS — K2970 Gastritis, unspecified, without bleeding: Secondary | ICD-10-CM | POA: Diagnosis present

## 2022-04-25 DIAGNOSIS — Z8249 Family history of ischemic heart disease and other diseases of the circulatory system: Secondary | ICD-10-CM

## 2022-04-25 DIAGNOSIS — Z79899 Other long term (current) drug therapy: Secondary | ICD-10-CM

## 2022-04-25 DIAGNOSIS — N2889 Other specified disorders of kidney and ureter: Secondary | ICD-10-CM | POA: Diagnosis present

## 2022-04-25 DIAGNOSIS — K828 Other specified diseases of gallbladder: Secondary | ICD-10-CM | POA: Diagnosis present

## 2022-04-25 DIAGNOSIS — Z833 Family history of diabetes mellitus: Secondary | ICD-10-CM

## 2022-04-25 DIAGNOSIS — Z6832 Body mass index (BMI) 32.0-32.9, adult: Secondary | ICD-10-CM

## 2022-04-25 DIAGNOSIS — Z87891 Personal history of nicotine dependence: Secondary | ICD-10-CM

## 2022-04-25 DIAGNOSIS — E875 Hyperkalemia: Secondary | ICD-10-CM | POA: Diagnosis present

## 2022-04-25 DIAGNOSIS — F1091 Alcohol use, unspecified, in remission: Secondary | ICD-10-CM | POA: Diagnosis present

## 2022-04-25 LAB — BASIC METABOLIC PANEL
Anion gap: 10 (ref 5–15)
BUN: 60 mg/dL — ABNORMAL HIGH (ref 6–20)
CO2: 25 mmol/L (ref 22–32)
Calcium: 8.6 mg/dL — ABNORMAL LOW (ref 8.9–10.3)
Chloride: 97 mmol/L — ABNORMAL LOW (ref 98–111)
Creatinine, Ser: 3.15 mg/dL — ABNORMAL HIGH (ref 0.61–1.24)
GFR, Estimated: 22 mL/min — ABNORMAL LOW (ref >60)
Glucose, Bld: 123 mg/dL — ABNORMAL HIGH (ref 70–99)
Potassium: 6.2 mmol/L — ABNORMAL HIGH (ref 3.5–5.1)
Sodium: 132 mmol/L — ABNORMAL LOW (ref 135–145)

## 2022-04-25 LAB — CBC
HCT: 40.7 % (ref 39.0–52.0)
Hemoglobin: 12.7 g/dL — ABNORMAL LOW (ref 13.0–17.0)
MCH: 27.2 pg (ref 26.0–34.0)
MCHC: 31.2 g/dL (ref 30.0–36.0)
MCV: 87.2 fL (ref 80.0–100.0)
Platelets: 138 10*3/uL — ABNORMAL LOW (ref 150–400)
RBC: 4.67 MIL/uL (ref 4.22–5.81)
RDW: 20.6 % — ABNORMAL HIGH (ref 11.5–15.5)
WBC: 8 10*3/uL (ref 4.0–10.5)
nRBC: 0.6 % — ABNORMAL HIGH (ref 0.0–0.2)

## 2022-04-25 LAB — HEPATIC FUNCTION PANEL
ALT: 13 U/L (ref 0–44)
AST: 33 U/L (ref 15–41)
Albumin: 3.5 g/dL (ref 3.5–5.0)
Alkaline Phosphatase: 94 U/L (ref 38–126)
Bilirubin, Direct: 2.1 mg/dL — ABNORMAL HIGH (ref 0.0–0.2)
Indirect Bilirubin: 2.7 mg/dL — ABNORMAL HIGH (ref 0.3–0.9)
Total Bilirubin: 4.8 mg/dL — ABNORMAL HIGH (ref 0.3–1.2)
Total Protein: 7.7 g/dL (ref 6.5–8.1)

## 2022-04-25 LAB — TROPONIN I (HIGH SENSITIVITY)
Troponin I (High Sensitivity): 43 ng/L — ABNORMAL HIGH (ref <18)
Troponin I (High Sensitivity): 48 ng/L — ABNORMAL HIGH (ref <18)

## 2022-04-25 LAB — POTASSIUM: Potassium: 5.5 mmol/L — ABNORMAL HIGH (ref 3.5–5.1)

## 2022-04-25 LAB — MAGNESIUM: Magnesium: 2.5 mg/dL — ABNORMAL HIGH (ref 1.7–2.4)

## 2022-04-25 LAB — LIPASE, BLOOD: Lipase: 24 U/L (ref 11–51)

## 2022-04-25 LAB — RESP PANEL BY RT-PCR (RSV, FLU A&B, COVID)  RVPGX2
Influenza A by PCR: NEGATIVE
Influenza B by PCR: NEGATIVE
Resp Syncytial Virus by PCR: NEGATIVE
SARS Coronavirus 2 by RT PCR: NEGATIVE

## 2022-04-25 MED ORDER — PATIROMER SORBITEX CALCIUM 8.4 G PO PACK
25.2000 g | PACK | Freq: Every day | ORAL | Status: DC
  Administered 2022-04-25: 25.2 g via ORAL
  Filled 2022-04-25 (×2): qty 3

## 2022-04-25 MED ORDER — CALCIUM GLUCONATE 10 % IV SOLN
1.0000 g | Freq: Once | INTRAVENOUS | Status: AC
  Administered 2022-04-25: 1 g via INTRAVENOUS
  Filled 2022-04-25: qty 10

## 2022-04-25 MED ORDER — OXYCODONE HCL 5 MG PO TABS
5.0000 mg | ORAL_TABLET | ORAL | Status: AC
  Administered 2022-04-25: 5 mg via ORAL
  Filled 2022-04-25: qty 1

## 2022-04-25 MED ORDER — FUROSEMIDE 10 MG/ML IJ SOLN
120.0000 mg | Freq: Once | INTRAVENOUS | Status: AC
  Administered 2022-04-25: 120 mg via INTRAVENOUS
  Filled 2022-04-25: qty 10

## 2022-04-25 NOTE — Assessment & Plan Note (Addendum)
Patient does not currently drink, per patient he quit drinking 13 years ago

## 2022-04-25 NOTE — ED Provider Notes (Signed)
[facility] Provider Note    Event Date/Time   First MD Initiated Contact with Patient 04/25/22 1837     (approximate)   History   Chief Complaint: Emesis and Shortness of Breath   HPI  Hector [name] is a 57 y.o. male with a history of hypertension pancreatitis atrial fibrillation stroke CKD alcohol abuse cirrhosis who comes ED complaining of shortness of breath for the past 2 days, vomiting since yesterday.  Feels like his abdomen is more distended than usual.  Reports having to have a paracentesis about 3 months ago.  Also complains of a productive cough and chills.  No fever, no chest pain.  Reports compliance with his medications.     Physical Exam   Triage Vital Signs: ED Triage Vitals  Enc Vitals Group     BP 04/25/22 1818 (!) 141/126     Pulse Rate 04/25/22 1818 69     Resp 04/25/22 1818 (!) 26     Temp 04/25/22 1829 (!) 91.6 F (33.1 C)     Temp Source 04/25/22 1818 Oral     SpO2 04/25/22 1818 92 %     Weight 04/25/22 1816 245 lb (111.1 kg)     Height 04/25/22 1816 '5\' 11"'$  (1.803 m)     Head Circumference --      Peak Flow --      Pain Score 04/25/22 1815 8     Pain Loc --      Pain Edu? --      Excl. in [place]? --     Most recent vital signs: Vitals:   04/25/22 2000 04/25/22 2030  BP: (!) 120/94 (!) 107/96  Pulse: (!) 109 (!) 121  Resp: (!) 21 (!) 25  Temp:    SpO2: 93% 97%    General: Awake, no distress.  CV:  Good peripheral perfusion.  Tachycardia heart rate 100 Resp:  Normal effort.  Clear to auscultation bilaterally Abd:  Mildly distended.  Soft and nontender.  No peritoneal signs Other:  Normal mental status.  No lower extremity edema.   ED Results / Procedures / Treatments   Labs (all labs ordered are listed, but only abnormal results are displayed) Labs Reviewed  BASIC METABOLIC PANEL - Abnormal; Notable for the following components:      Result Value   Sodium 132 (*)    Potassium 6.2 (*)    Chloride 97 (*)     Glucose, Bld 123 (*)    BUN 60 (*)    Creatinine, Ser 3.15 (*)    Calcium 8.6 (*)    GFR, Estimated 22 (*)    All other components within normal limits  CBC - Abnormal; Notable for the following components:   Hemoglobin 12.7 (*)    RDW 20.6 (*)    Platelets 138 (*)    nRBC 0.6 (*)    All other components within normal limits  HEPATIC FUNCTION PANEL - Abnormal; Notable for the following components:   Total Bilirubin 4.8 (*)    Bilirubin, Direct 2.1 (*)    Indirect Bilirubin 2.7 (*)    All other components within normal limits  MAGNESIUM - Abnormal; Notable for the following components:   Magnesium 2.5 (*)    All other components within normal limits  TROPONIN I (HIGH SENSITIVITY) - Abnormal; Notable for the following components:   Troponin I (High Sensitivity) 43 (*)    All other components within normal limits  TROPONIN I (HIGH SENSITIVITY) - Abnormal;  Notable for the following components:   Troponin I (High Sensitivity) 48 (*)    All other components within normal limits  RESP PANEL BY RT-PCR (RSV, FLU A&B, COVID)  RVPGX2  LIPASE, BLOOD     EKG Interpreted by me Atrial fibrillation rate of 104.  Left axis, left bundle branch block, poor R wave progression.  1 PVC on the strip.  No ischemic changes.   RADIOLOGY Chest x-ray interpreted by me, shows vascular congestion and mild pulmonary edema.  Radiology report reviewed   PROCEDURES:  Procedures   MEDICATIONS ORDERED IN ED: Medications  patiromer [facility]) packet 25.2 g (25.2 g Oral Given 04/25/22 2147)  furosemide (LASIX) 120 mg in dextrose 5 % 50 mL IVPB (0 mg Intravenous Stopped 04/25/22 2049)  calcium gluconate inj 10% (1 g) URGENT USE ONLY! (1 g Intravenous Given 04/25/22 2145)  oxyCODONE (Oxy IR/ROXICODONE) immediate release tablet 5 mg (5 mg Oral Given 04/25/22 2146)     IMPRESSION / MDM / ASSESSMENT AND PLAN / ED COURSE  I reviewed the triage vital signs and the nursing notes.  DDx: Pneumonia, viral  illness, pulmonary edema, electrolyte abnormality, AKI, anemia, non-STEMI, decompensated liver failure  Patient's presentation is most consistent with acute presentation with potential threat to life or bodily function.  Patient presents with shortness of breath, cough, reported increased abdominal distention despite compliance with his medications which include torsemide.  Chest x-ray does show some pulmonary edema.  Labs show baseline CKD but with potassium elevated to 6.2.  No significant EKG changes.  Other labs are unremarkable.  Will plan to hospitalize for diuresis and ensuring potassium level is improved.  IV Lasix and oral Veltassa ordered for now.   Clinical Course as of 04/25/22 2201  Sat Apr 25, 2022  2138 Minimal urine output with IV Lasix bolus.  Has stage IV CKD, has hyperkalemia of 6.2.  Will plan to admit for further diuresis given his pronounced symptoms [PS]    Clinical Course User Index [PS] Carrie Mew, MD    ----------------------------------------- 10:02 PM on 04/25/2022 ----------------------------------------- Case discussed with hospitalist   FINAL CLINICAL IMPRESSION(S) / ED DIAGNOSES   Final diagnoses:  Acute on chronic congestive heart failure, unspecified heart failure type ([place])  Stage 4 chronic kidney disease ([place])  Hyperkalemia     Rx / DC Orders   ED Discharge Orders     None        Note:  This document was prepared using Dragon voice recognition software and may include unintentional dictation errors.   Carrie Mew, MD 04/25/22 2202

## 2022-04-25 NOTE — ED Notes (Signed)
Did not take BP med today.

## 2022-04-25 NOTE — Assessment & Plan Note (Signed)
No acute concerns

## 2022-04-25 NOTE — Hospital Course (Signed)
A. fib on Eliquis, pancreatitis, sCHF with EF < 20%, CKD-IV, HTN, HLD, stroke, hypothyroidism, alcohol abuse in remission, GI bleeding, OSA, anemia, hospitalized 2 months ago with RSV who presents to the ED with cough and shortness of breath for the past 2 days as well as abdominal distention.  States he vomited twice the day prior, nonbloody nonbilious.  Denies diarrhea and dysuria.  He denies fever or chills and denies chest pain. ED course and data review: BP 141/126 with heart rate in the low 100s.  Respirations 25-26 with O2 sat on room mid to high 90s.  Labs notable for troponin of 48, BNP pending.  CBC with normal BBC count platelets 1 38,000., respiratory viral panel negative.  Creatinine near baseline at 3.5, potassium 6.2, sodium 132.  Hepatic function panel with total bilirubin 4.8. EKG,.Reviewed and interpreted showing sinus at 106 with nonspecific ST-T wave changes chest x-ray shows.Findings suspicious for developing pulmonary edema. Patient treated with IV Lasix for CHF, given a dose of calcium gluconate and Veltassa for hyperkalemia.  Hospitalist consulted for admission.

## 2022-04-25 NOTE — Assessment & Plan Note (Addendum)
Bilirubin chronically elevated around 2.5 but now 4.8>>5.4 . INR 1.7 suspect related to hepatic congestion from CHF Had a HIDA scan July 2023 for bilirubin around 3 that showed "Abnormally low gallbladder ejection fraction equal to 6% indicative of decreased gallbladder emptying and gallbladder dysfunction" Renal ultrasound with concern of cirrhosis.

## 2022-04-25 NOTE — Assessment & Plan Note (Signed)
Heart rate in the 1 teens.  Has had DC cardioversion in the past Continue amiodarone and carvedilol Continue apixaban for primary stroke prevention

## 2022-04-25 NOTE — ED Triage Notes (Signed)
Pt to ED POV for several complaints. Been throwing up since yesterday, feeling SOB since 2 days ago, and says there is fluid building up in his abdomen and that he had to have it drained off some time before Christmas but denies hx cirrhosis. Has been coughing up white phlegm and thinks could have PNA. Appears pale.

## 2022-04-25 NOTE — ED Notes (Signed)
Unable to get good reading on temp. Tried 4 times, finally got 91.5 axillary. Pt states unable to stand long enough for rectal temp (due to R knee surgery). Flex will not see pt, need to wait for room on main.

## 2022-04-25 NOTE — Assessment & Plan Note (Signed)
Potassium was 6.2 Received calcium gluconate and Veltassa in the ED Recheck potassium every 4 hours and correct as needed

## 2022-04-25 NOTE — H&P (Signed)
History and Physical    Patient: Hector [name] [id number] DOB: [birth year] DOA: 04/25/2022 DOS: the patient was seen and examined on 04/25/2022 PCP: [name], MD  Patient coming from: Home  Chief Complaint:  Chief Complaint  Patient presents with  . Emesis  . Shortness of Breath    HPI: Hector [name] is a 57 y.o. male with medical history significant for A. fib on Eliquis, pancreatitis, sCHF with EF < 20%, CKD-IV, HTN, HLD, stroke, hypothyroidism, alcohol abuse in remission, GI bleeding, OSA, anemia, hospitalized 2 months ago with RSV who presents to the ED with cough and shortness of breath for the past 2 days as well as abdominal distention.  States he vomited twice the day prior, nonbloody nonbilious.  Denies diarrhea and dysuria.  He denies fever or chills and denies chest pain. ED course and data review: BP 141/126 with heart rate in the low 100s.  Respirations 25-26 with O2 sat on room mid to high 90s.  Labs notable for troponin of 48, BNP pending.  CBC with normal BBC count platelets 1 38,000., respiratory viral panel negative.  Creatinine near baseline at 3.5, potassium 6.2, sodium 132.  Hepatic function panel with total bilirubin 4.8. EKG,.Reviewed and interpreted showing sinus at 106 with nonspecific ST-T wave changes chest x-ray shows.Findings suspicious for developing pulmonary edema. Patient treated with IV Lasix for CHF, given a dose of calcium gluconate and Veltassa for hyperkalemia.  Hospitalist consulted for admission.   Review of Systems: As mentioned in the history of present illness. All other systems reviewed and are negative.  Past Medical History:  Diagnosis Date  . Alcohol abuse   . Alcoholic cardiomyopathy ([place])    a. 12/2007 MV: EF 28%, no isch;  b. 8/12 Echo: EF 25-35%; c. 02/2014 Echo: EF 20-25%; d. 12/2014 Cath: minimal CAD; e. 01/2015 Echo: EF 50-55%;  d. 05/2016 Echo: EF 30-35%, diff HK, gr2 DD; e. 11/2016 Echo: EF 45-50%, diff HK; f.  09/2021 Echo: EF <20%; g. 11/2021 Echo: EF<20%.  . Chronic combined systolic (congestive) and diastolic (congestive) heart failure ([place])    a. 05/2016 Echo: EF 30-35%; b. 11/2016 Echo: EF 45-50%, diff HK; c. 09/2021 Echo: EF < 20%; d. 11/2021 Echo: EF <20%, no rwma, Nl RV fxn, sev dil LA, mod-sev MR. mod dil PA w/ mildly elev PASP.  Marland Kitchen CKD (chronic kidney disease), stage III ([place])   . Elevated troponin (chronic)   . Essential hypertension   . GI bleed 11/2013  . Hyperlipidemia   . Pancreatitis   . Paroxysmal A-fib ([place])    a. new onset s/p unsuccessful TEE/DCCV on 08/16/2014; b. CHA2DS2VASc = 3-> eliquis (freq noncompliant); c. 02/2021 s/p TEE/DCCV; d. Prev on amio->d/c 2/2 abnl TFTs.  . Paroxysmal atrial flutter ([place])   . Sleep-disordered breathing    Has yet to have a sleep study  . Stroke [facility])    Past Surgical History:  Procedure Laterality Date  . CARDIAC CATHETERIZATION N/A 01/09/2015   Procedure: Left Heart Cath and Coronary Angiography;  Surgeon: [name], MD;  Location: [place] [facility];  Service: Cardiovascular;  Laterality: N/A;  . CARDIOVERSION N/A 03/05/2021   Procedure: CARDIOVERSION;  Surgeon: Kate Sable, MD;  Location: [facility] ORS;  Service: Cardiovascular;  Laterality: N/A;  . COLONOSCOPY N/A 07/22/2020   Procedure: COLONOSCOPY;  Surgeon: Toledo, Benay Pike, MD;  Location: [facility] ENDOSCOPY;  Service: Gastroenterology;  Laterality: N/A;  . ELECTROPHYSIOLOGIC STUDY N/A 08/16/2014   Procedure: CARDIOVERSION;  Surgeon: Christia Reading  Gloriajean Dell, MD;  Location: [facility] ORS;  Service: Cardiovascular;  Laterality: N/A;  . FLEXIBLE SIGMOIDOSCOPY N/A 10/10/2015   Procedure: FLEXIBLE SIGMOIDOSCOPY;  Surgeon: [name], MD;  Location: [facility] ENDOSCOPY;  Service: Endoscopy;  Laterality: N/A;  . KNEE SURGERY Right   . NM MYOVIEW LTD  November 2011   No ischemia or infarction. EF 50-55% ( no improvement from 2009 Myoview EF of 28%  . TEE WITHOUT CARDIOVERSION N/A 08/16/2014    Procedure: TRANSESOPHAGEAL ECHOCARDIOGRAM (TEE);  Surgeon: [name], MD;  Location: [facility] ORS;  Service: Cardiovascular;  Laterality: N/A;  . TEE WITHOUT CARDIOVERSION N/A 03/05/2021   Procedure: TRANSESOPHAGEAL ECHOCARDIOGRAM (TEE);  Surgeon: Kate Sable, MD;  Location: [facility] ORS;  Service: Cardiovascular;  Laterality: N/A;   Social History:  reports that he quit smoking about 24 years ago. His smoking use included cigarettes. He has a 12.00 pack-year smoking history. He has never used smokeless tobacco. He reports that he does not currently use alcohol. He reports that he does not use drugs.  Allergies  Allergen Reactions  . Esomeprazole Magnesium Other (See Comments)    Suspected interstitial nephritis 2018  . Eggs Or Egg-Derived Products Rash    Family History  Problem Relation Age of Onset  . Hypertension Mother   . Hyperlipidemia Mother   . Diabetes Mother     Prior to Admission medications   Medication Sig Start Date End Date Taking? Authorizing Provider  allopurinol (ZYLOPRIM) 100 MG tablet Take 0.5 tablets (50 mg total) by mouth daily. 10/03/21   Danford, Suann Larry, MD  amiodarone (PACERONE) 200 MG tablet Take 1 tablet (200 mg total) by mouth 2 (two) times daily. 02/02/22   Bensimhon, Shaune Pascal, MD  apixaban (ELIQUIS) 5 MG TABS tablet Take 1 tablet (5 mg total) by mouth 2 (two) times daily. 10/02/21   Danford, Suann Larry, MD  atorvastatin (LIPITOR) 80 MG tablet Take 1 tablet (80 mg total) by mouth daily. 11/24/21   Annita Brod, MD  canagliflozin [facility]) 100 MG TABS tablet Take 1 tablet (100 mg total) by mouth daily before breakfast. 04/06/22   Samella Parr, NP  carvedilol (COREG) 6.25 MG tablet Take 1 tablet (6.25 mg total) by mouth 2 (two) times daily with a meal. 10/02/21   Danford, Suann Larry, MD  ferrous gluconate (FERGON) 324 MG tablet Take 324 mg by mouth daily with breakfast. 12/22/21   [provider]  levothyroxine (SYNTHROID) 100  MCG tablet Take 1 tablet (100 mcg total) by mouth daily before breakfast. 11/24/21   Annita Brod, MD  metolazone (ZAROXOLYN) 2.5 MG tablet Take 1 tablet (2.5 mg total) by mouth daily. Patient taking differently: Take 2.5 mg by mouth once a week. On Fridays 02/02/22   Bensimhon, Shaune Pascal, MD  potassium chloride SA (KLOR-CON M) 20 MEQ tablet Take 1 tablet (20 mEq total) by mouth daily. Take an extra 2 tablets on Mon and Fri with metolazone. Patient taking differently: Take 20 mEq by mouth daily. Take an extra 2 tablets on Fri with metolazone. 02/02/22   Bensimhon, Shaune Pascal, MD  torsemide (DEMADEX) 20 MG tablet Take 3 tablets (60 mg total) by mouth daily. 03/05/22   Ezekiel Slocumb, DO  traMADol (ULTRAM) 50 MG tablet Take 1 tablet (50 mg total) by mouth every 6 (six) hours as needed. 03/23/22 03/23/23  Lavonia Drafts, MD    Physical Exam: Vitals:   04/25/22 2000 04/25/22 2030 04/25/22 2200 04/25/22 2203  BP: (!) 120/94 [place] Kitchen)  107/96 (!) 133/110   Pulse: (!) 109 (!) 121 (!) 53   Resp: (!) 21 (!) 25 (!) 25   Temp:    97.6 F (36.4 C)  TempSrc:    Oral  SpO2: 93% 97% 98%   Weight:      Height:       Physical Exam  Labs on Admission: I have personally reviewed following labs and imaging studies  CBC: Recent Labs  Lab 04/25/22 1821  WBC 8.0  HGB 12.7*  HCT 40.7  MCV 87.2  PLT 0000000*   Basic Metabolic Panel: Recent Labs  Lab 04/25/22 1821  NA 132*  K 6.2*  CL 97*  CO2 25  GLUCOSE 123*  BUN 60*  CREATININE 3.15*  CALCIUM 8.6*  MG 2.5*   GFR: Estimated Creatinine Clearance: 32.8 mL/min (A) (by C-G formula based on SCr of 3.15 mg/dL (H)). Liver Function Tests: Recent Labs  Lab 04/25/22 1821  AST 33  ALT 13  ALKPHOS 94  BILITOT 4.8*  PROT 7.7  ALBUMIN 3.5   Recent Labs  Lab 04/25/22 1821  LIPASE 24   No results for input(s): "AMMONIA" in the last 168 hours. Coagulation Profile: No results for input(s): "INR", "PROTIME" in the last 168 hours. Cardiac  Enzymes: No results for input(s): "CKTOTAL", "CKMB", "CKMBINDEX", "TROPONINI" in the last 168 hours. BNP (last 3 results) No results for input(s): "PROBNP" in the last 8760 hours. HbA1C: No results for input(s): "HGBA1C" in the last 72 hours. CBG: No results for input(s): "GLUCAP" in the last 168 hours. Lipid Profile: No results for input(s): "CHOL", "HDL", "LDLCALC", "TRIG", "CHOLHDL", "LDLDIRECT" in the last 72 hours. Thyroid Function Tests: No results for input(s): "TSH", "T4TOTAL", "FREET4", "T3FREE", "THYROIDAB" in the last 72 hours. Anemia Panel: No results for input(s): "VITAMINB12", "FOLATE", "FERRITIN", "TIBC", "IRON", "RETICCTPCT" in the last 72 hours. Urine analysis:    Component Value Date/Time   COLORURINE YELLOW (A) 02/23/2022 1106   APPEARANCEUR CLEAR (A) 02/23/2022 1106   APPEARANCEUR Clear 02/26/2014 1126   LABSPEC 1.009 02/23/2022 1106   LABSPEC 1.008 02/26/2014 1126   PHURINE 7.0 02/23/2022 1106   GLUCOSEU >=500 (A) 02/23/2022 1106   GLUCOSEU Negative 02/26/2014 1126   HGBUR SMALL (A) 02/23/2022 1106   BILIRUBINUR NEGATIVE 02/23/2022 1106   BILIRUBINUR Negative 02/26/2014 1126   KETONESUR NEGATIVE 02/23/2022 1106   PROTEINUR >=300 (A) 02/23/2022 1106   NITRITE NEGATIVE 02/23/2022 1106   LEUKOCYTESUR MODERATE (A) 02/23/2022 1106   LEUKOCYTESUR 1+ 02/26/2014 1126    Radiological Exams on Admission: DG Chest 2 View  Result Date: 04/25/2022 CLINICAL DATA:  SOB EXAM: CHEST - 2 VIEW COMPARISON:  Chest XR, 02/23/2022.  CT chest, 01/06/2022. FINDINGS: Cardiac silhouette is mildly enlarged. Lungs are hypoinflated. Perihilar interstitial thickening and trace fluid layering along minor fissure. No focal consolidation or mass. No pleural effusion or pneumothorax. No acute displaced fracture. IMPRESSION: Cardiomegaly and perihilar thickening, with layering fluid along the fissure. Findings suspicious for developing pulmonary edema. Electronically Signed   By: Michaelle Birks  M.D.   On: 04/25/2022 18:54     Data Reviewed: Relevant notes from primary care and specialist visits, past discharge summaries as available in EHR, including Care Everywhere. Prior diagnostic testing as pertinent to current admission diagnoses Updated medications and problem lists for reconciliation ED course, including vitals, labs, imaging, treatment and response to treatment Triage notes, nursing and pharmacy notes and ED provider's notes Notable results as noted in HPI   Assessment and Plan: Acute on  chronic systolic CHF (congestive heart failure) (HCC) Nonischemic cardiomyopathy, EF less than 20% not previously a candidate for ICD Patient presents with shortness of breath, abdominal distention, with chest x-ray suspicious for developing pulmonary edema IV Lasix with monitoring of renal function Continue carvedilol and metolazone and potassium Daily weights with intake and output monitoring Last echo October 2023 showed EF less than 20% Patient was previously deemed not to be ICD insertion by EP due to noncompliance  Patient was last seen by his cardiologist Dr. Haroldine Laws in December 2023 at which time metolazone was added to his diuretic regimen, weight was 247 pounds at the time Reds vest  Atrial fibrillation with RVR ([place]) Heart rate in the 1 teens.  Has had DC cardioversion in the past Continue amiodarone and carvedilol Continue apixaban for primary stroke prevention  Hyponatremia Secondary to fluid overload Continue to monitor  Hypothyroidism Continue levothyroxine  History of GI bleed No acute concerns  Thrombocytopenia (HCC) Mildly decreased at 1 38,000 Continue to monitor  Hyperbilirubinemia Bilirubin chronically elevated around 2.5 but now 4.8  suspect related to hepatic congestion from CHF Had a HIDA scan July 2023 for bilirubin around 3 that showed "Abnormally low gallbladder ejection fraction equal to 6% indicative of decreased gallbladder emptying and  gallbladder dysfunction" Given elevated bilirubin and vomiting the day prior we will get RUQ ultrasound  Hyperkalemia Potassium was 6.2 Received calcium gluconate and Veltassa in the ED Recheck potassium every 4 hours and correct as needed  Type II diabetes mellitus with renal manifestations ([place]) Continue Invokana Sliding scale insulin coverage  Alcohol use, unspecified, in remission Patient does not currently drink     DVT prophylaxis: apixaban  Consults: Cardiology, [place] Planning:   Code Status: Prior   Family Communication: none***  Disposition Plan: Back to previous home environment  Severity of Illness: {Observation/Inpatient:21159}  Author: Athena Masse, MD 04/25/2022 10:09 PM  For on call review [email or web address].

## 2022-04-25 NOTE — Assessment & Plan Note (Signed)
Mildly decreased at 1 38,000 Continue to monitor

## 2022-04-25 NOTE — Assessment & Plan Note (Addendum)
Hold Invokana due to worsening renal function Sliding scale insulin coverage

## 2022-04-25 NOTE — Assessment & Plan Note (Signed)
Continue levothyroxine 

## 2022-04-25 NOTE — Assessment & Plan Note (Signed)
Secondary to fluid overload Continue to monitor

## 2022-04-25 NOTE — Assessment & Plan Note (Addendum)
Nonischemic cardiomyopathy, EF less than 20% not previously a candidate for ICD Patient presents with shortness of breath, abdominal distention, with chest x-ray suspicious for developing pulmonary edema IV Lasix with monitoring of renal function Continue carvedilol and metolazone and potassium Daily weights with intake and output monitoring Last echo October 2023 showed EF less than 20% Patient was previously deemed not to be ICD insertion by EP due to noncompliance  Patient was last seen by his cardiologist Dr. Haroldine Laws in December 2023 at which time metolazone was added to his diuretic regimen, weight was 247 pounds at the time Reds vest

## 2022-04-26 ENCOUNTER — Observation Stay: Payer: Medicaid Other

## 2022-04-26 DIAGNOSIS — E1122 Type 2 diabetes mellitus with diabetic chronic kidney disease: Secondary | ICD-10-CM | POA: Diagnosis present

## 2022-04-26 DIAGNOSIS — N184 Chronic kidney disease, stage 4 (severe): Secondary | ICD-10-CM

## 2022-04-26 DIAGNOSIS — I5043 Acute on chronic combined systolic (congestive) and diastolic (congestive) heart failure: Secondary | ICD-10-CM | POA: Diagnosis present

## 2022-04-26 DIAGNOSIS — I4891 Unspecified atrial fibrillation: Secondary | ICD-10-CM

## 2022-04-26 DIAGNOSIS — N179 Acute kidney failure, unspecified: Secondary | ICD-10-CM | POA: Diagnosis present

## 2022-04-26 DIAGNOSIS — R1013 Epigastric pain: Secondary | ICD-10-CM | POA: Diagnosis not present

## 2022-04-26 DIAGNOSIS — I130 Hypertensive heart and chronic kidney disease with heart failure and stage 1 through stage 4 chronic kidney disease, or unspecified chronic kidney disease: Secondary | ICD-10-CM | POA: Diagnosis present

## 2022-04-26 DIAGNOSIS — Z6832 Body mass index (BMI) 32.0-32.9, adult: Secondary | ICD-10-CM | POA: Diagnosis not present

## 2022-04-26 DIAGNOSIS — F1011 Alcohol abuse, in remission: Secondary | ICD-10-CM | POA: Diagnosis present

## 2022-04-26 DIAGNOSIS — Z79899 Other long term (current) drug therapy: Secondary | ICD-10-CM | POA: Diagnosis not present

## 2022-04-26 DIAGNOSIS — E875 Hyperkalemia: Secondary | ICD-10-CM

## 2022-04-26 DIAGNOSIS — I5023 Acute on chronic systolic (congestive) heart failure: Secondary | ICD-10-CM | POA: Diagnosis not present

## 2022-04-26 DIAGNOSIS — D696 Thrombocytopenia, unspecified: Secondary | ICD-10-CM | POA: Diagnosis present

## 2022-04-26 DIAGNOSIS — E039 Hypothyroidism, unspecified: Secondary | ICD-10-CM | POA: Diagnosis present

## 2022-04-26 DIAGNOSIS — Z7901 Long term (current) use of anticoagulants: Secondary | ICD-10-CM | POA: Diagnosis not present

## 2022-04-26 DIAGNOSIS — R778 Other specified abnormalities of plasma proteins: Secondary | ICD-10-CM

## 2022-04-26 DIAGNOSIS — I428 Other cardiomyopathies: Secondary | ICD-10-CM | POA: Diagnosis not present

## 2022-04-26 DIAGNOSIS — E871 Hypo-osmolality and hyponatremia: Secondary | ICD-10-CM | POA: Diagnosis present

## 2022-04-26 DIAGNOSIS — K828 Other specified diseases of gallbladder: Secondary | ICD-10-CM | POA: Diagnosis present

## 2022-04-26 DIAGNOSIS — I480 Paroxysmal atrial fibrillation: Secondary | ICD-10-CM | POA: Diagnosis not present

## 2022-04-26 DIAGNOSIS — I4819 Other persistent atrial fibrillation: Secondary | ICD-10-CM | POA: Diagnosis present

## 2022-04-26 DIAGNOSIS — I509 Heart failure, unspecified: Secondary | ICD-10-CM | POA: Diagnosis not present

## 2022-04-26 DIAGNOSIS — I2510 Atherosclerotic heart disease of native coronary artery without angina pectoris: Secondary | ICD-10-CM | POA: Diagnosis present

## 2022-04-26 DIAGNOSIS — Z8249 Family history of ischemic heart disease and other diseases of the circulatory system: Secondary | ICD-10-CM | POA: Diagnosis not present

## 2022-04-26 DIAGNOSIS — E785 Hyperlipidemia, unspecified: Secondary | ICD-10-CM | POA: Diagnosis present

## 2022-04-26 DIAGNOSIS — K7030 Alcoholic cirrhosis of liver without ascites: Secondary | ICD-10-CM | POA: Diagnosis present

## 2022-04-26 DIAGNOSIS — Z8673 Personal history of transient ischemic attack (TIA), and cerebral infarction without residual deficits: Secondary | ICD-10-CM | POA: Diagnosis not present

## 2022-04-26 DIAGNOSIS — Z87891 Personal history of nicotine dependence: Secondary | ICD-10-CM | POA: Diagnosis not present

## 2022-04-26 DIAGNOSIS — Z1152 Encounter for screening for COVID-19: Secondary | ICD-10-CM | POA: Diagnosis not present

## 2022-04-26 LAB — COMPREHENSIVE METABOLIC PANEL
ALT: 16 U/L (ref 0–44)
AST: 23 U/L (ref 15–41)
Albumin: 3.5 g/dL (ref 3.5–5.0)
Alkaline Phosphatase: 93 U/L (ref 38–126)
Anion gap: 15 (ref 5–15)
BUN: 63 mg/dL — ABNORMAL HIGH (ref 6–20)
CO2: 18 mmol/L — ABNORMAL LOW (ref 22–32)
Calcium: 8.8 mg/dL — ABNORMAL LOW (ref 8.9–10.3)
Chloride: 99 mmol/L (ref 98–111)
Creatinine, Ser: 3.38 mg/dL — ABNORMAL HIGH (ref 0.61–1.24)
GFR, Estimated: 20 mL/min — ABNORMAL LOW (ref >60)
Glucose, Bld: 103 mg/dL — ABNORMAL HIGH (ref 70–99)
Potassium: 4.5 mmol/L (ref 3.5–5.1)
Sodium: 132 mmol/L — ABNORMAL LOW (ref 135–145)
Total Bilirubin: 5.4 mg/dL — ABNORMAL HIGH (ref 0.3–1.2)
Total Protein: 6.8 g/dL (ref 6.5–8.1)

## 2022-04-26 LAB — CBC
HCT: 36.9 % — ABNORMAL LOW (ref 39.0–52.0)
Hemoglobin: 11.9 g/dL — ABNORMAL LOW (ref 13.0–17.0)
MCH: 27 pg (ref 26.0–34.0)
MCHC: 32.2 g/dL (ref 30.0–36.0)
MCV: 83.9 fL (ref 80.0–100.0)
Platelets: 223 10*3/uL (ref 150–400)
RBC: 4.4 MIL/uL (ref 4.22–5.81)
RDW: 20.6 % — ABNORMAL HIGH (ref 11.5–15.5)
WBC: 7.8 10*3/uL (ref 4.0–10.5)
nRBC: 0 % (ref 0.0–0.2)

## 2022-04-26 LAB — CK: Total CK: 291 U/L (ref 49–397)

## 2022-04-26 LAB — PROTIME-INR
INR: 1.7 — ABNORMAL HIGH (ref 0.8–1.2)
INR: 2 — ABNORMAL HIGH (ref 0.8–1.2)
Prothrombin Time: 19.8 seconds — ABNORMAL HIGH (ref 11.4–15.2)
Prothrombin Time: 22.1 seconds — ABNORMAL HIGH (ref 11.4–15.2)

## 2022-04-26 LAB — HIV ANTIBODY (ROUTINE TESTING W REFLEX): HIV Screen 4th Generation wRfx: NONREACTIVE

## 2022-04-26 LAB — HEPARIN LEVEL (UNFRACTIONATED): Heparin Unfractionated: >1.1 IU/mL — ABNORMAL HIGH (ref 0.30–0.70)

## 2022-04-26 LAB — APTT: aPTT: 41 seconds — ABNORMAL HIGH (ref 24–36)

## 2022-04-26 LAB — POTASSIUM
Potassium: 5.1 mmol/L (ref 3.5–5.1)
Potassium: 4.6 mmol/L (ref 3.5–5.1)

## 2022-04-26 LAB — BRAIN NATRIURETIC PEPTIDE: B Natriuretic Peptide: 2109.1 pg/mL — ABNORMAL HIGH (ref 0.0–100.0)

## 2022-04-26 MED ORDER — METOLAZONE 2.5 MG PO TABS
2.5000 mg | ORAL_TABLET | Freq: Every day | ORAL | Status: DC
  Administered 2022-04-26: 2.5 mg via ORAL
  Filled 2022-04-26: qty 1

## 2022-04-26 MED ORDER — APIXABAN 5 MG PO TABS
5.0000 mg | ORAL_TABLET | Freq: Two times a day (BID) | ORAL | Status: DC
  Administered 2022-04-26: 5 mg via ORAL
  Filled 2022-04-26: qty 1

## 2022-04-26 MED ORDER — ACETAMINOPHEN 325 MG PO TABS
650.0000 mg | ORAL_TABLET | Freq: Four times a day (QID) | ORAL | Status: DC | PRN
  Administered 2022-05-03: 650 mg via ORAL
  Filled 2022-04-26 (×2): qty 2

## 2022-04-26 MED ORDER — INSULIN ASPART 100 UNIT/ML IJ SOLN: 0.0000 [IU] | Freq: Every day | INTRAMUSCULAR | Status: DC

## 2022-04-26 MED ORDER — ALUM & MAG HYDROXIDE-SIMETH 200-200-20 MG/5ML PO SUSP
30.0000 mL | Freq: Once | ORAL | Status: AC
  Administered 2022-04-26: 30 mL via ORAL
  Filled 2022-04-26: qty 30

## 2022-04-26 MED ORDER — HEPARIN BOLUS VIA INFUSION
4500.0000 [IU] | Freq: Once | INTRAVENOUS | Status: AC
  Administered 2022-04-26: 4500 [IU] via INTRAVENOUS
  Filled 2022-04-26: qty 4500

## 2022-04-26 MED ORDER — CANAGLIFLOZIN 100 MG PO TABS
100.0000 mg | ORAL_TABLET | Freq: Every day | ORAL | Status: DC
  Administered 2022-04-26: 100 mg via ORAL
  Filled 2022-04-26: qty 1

## 2022-04-26 MED ORDER — HEPARIN (PORCINE) 25000 UT/250ML-% IV SOLN
1500.0000 [IU]/h | INTRAVENOUS | Status: DC
  Administered 2022-04-26: 1500 [IU]/h via INTRAVENOUS
  Filled 2022-04-26: qty 250

## 2022-04-26 MED ORDER — ONDANSETRON HCL 4 MG PO TABS: 4.0000 mg | ORAL_TABLET | Freq: Four times a day (QID) | ORAL | Status: DC | PRN

## 2022-04-26 MED ORDER — CARVEDILOL 12.5 MG PO TABS
12.5000 mg | ORAL_TABLET | Freq: Two times a day (BID) | ORAL | Status: DC
  Administered 2022-04-26 – 2022-04-28 (×4): 12.5 mg via ORAL
  Filled 2022-04-26 (×4): qty 1

## 2022-04-26 MED ORDER — LEVOTHYROXINE SODIUM 100 MCG PO TABS
100.0000 ug | ORAL_TABLET | Freq: Every day | ORAL | Status: DC
  Administered 2022-04-26 – 2022-05-04 (×8): 100 ug via ORAL
  Filled 2022-04-26 (×8): qty 1

## 2022-04-26 MED ORDER — ATORVASTATIN CALCIUM 80 MG PO TABS
80.0000 mg | ORAL_TABLET | Freq: Every day | ORAL | Status: DC
  Administered 2022-04-26: 80 mg via ORAL
  Filled 2022-04-26: qty 1

## 2022-04-26 MED ORDER — FUROSEMIDE 10 MG/ML IJ SOLN
40.0000 mg | Freq: Two times a day (BID) | INTRAMUSCULAR | Status: DC
  Administered 2022-04-26: 40 mg via INTRAVENOUS
  Filled 2022-04-26: qty 4

## 2022-04-26 MED ORDER — ONDANSETRON HCL 4 MG/2ML IJ SOLN
4.0000 mg | Freq: Four times a day (QID) | INTRAMUSCULAR | Status: DC | PRN
  Administered 2022-05-03: 4 mg via INTRAVENOUS
  Filled 2022-04-26: qty 2

## 2022-04-26 MED ORDER — AMIODARONE HCL 200 MG PO TABS
200.0000 mg | ORAL_TABLET | Freq: Two times a day (BID) | ORAL | Status: DC
  Administered 2022-04-26: 200 mg via ORAL
  Filled 2022-04-26: qty 1

## 2022-04-26 MED ORDER — OXYCODONE HCL 5 MG PO TABS
5.0000 mg | ORAL_TABLET | Freq: Four times a day (QID) | ORAL | Status: DC | PRN
  Administered 2022-04-26 – 2022-05-04 (×11): 5 mg via ORAL
  Filled 2022-04-26 (×11): qty 1

## 2022-04-26 MED ORDER — FERROUS GLUCONATE 324 (38 FE) MG PO TABS
324.0000 mg | ORAL_TABLET | Freq: Every day | ORAL | Status: DC
  Administered 2022-04-26 – 2022-04-27 (×2): 324 mg via ORAL
  Filled 2022-04-26 (×2): qty 1

## 2022-04-26 MED ORDER — ACETAMINOPHEN 650 MG RE SUPP: 650.0000 mg | Freq: Four times a day (QID) | RECTAL | Status: DC | PRN

## 2022-04-26 MED ORDER — INSULIN ASPART 100 UNIT/ML IJ SOLN
0.0000 [IU] | Freq: Three times a day (TID) | INTRAMUSCULAR | Status: DC
  Filled 2022-04-26: qty 1

## 2022-04-26 MED ORDER — CARVEDILOL 6.25 MG PO TABS
6.2500 mg | ORAL_TABLET | Freq: Two times a day (BID) | ORAL | Status: DC
  Administered 2022-04-26: 6.25 mg via ORAL
  Filled 2022-04-26: qty 1

## 2022-04-26 NOTE — Hospital Course (Addendum)
Taken from H&P.   Hector [name] is a 57 y.o. male with medical history significant for A. fib on Eliquis, pancreatitis, sCHF with EF < 20%, CKD-IV, HTN, HLD, stroke, hypothyroidism, alcohol abuse in remission, GI bleeding, OSA, anemia, hospitalized 2 months ago with RSV who presents to the ED with cough and shortness of breath for the past 2 days as well as abdominal distention.  ED course and data review: BP 141/126 with heart rate in the low 100s.  Respirations 25-26 with O2 sat on room mid to high 90s.  Labs notable for troponin of 48, BNP pending.  CBC with normal BBC count platelets 1 38,000., respiratory viral panel negative.  Creatinine near baseline at 3.5, potassium 6.2, sodium 132.  Hepatic function panel with total bilirubin 4.8. EKG,.Reviewed and interpreted showing sinus at 106 with nonspecific ST-T wave changes chest x-ray shows.Findings suspicious for developing pulmonary edema. Patient treated with IV Lasix for CHF, given a dose of calcium gluconate and Veltassa for hyperkalemia.  3/3: Vitals stable.  Hyperkalemia has been resolved with potassium of 4.6, Veltassa discontinued.  Patient was on potassium supplement at home which should be discontinued. BNP elevated at 736, patient also has CKD stage IV.  Troponin mildly positive with a flat curve.  RUQ ultrasound with cholelithiasis and gallbladder sludge without complicating factors.  Findings also suspicious for hepatic cirrhosis.  Patient quit drinking 13 years ago. Worsening T. bili and INR.  Concern of cardiohepatic and cardiorenal. Clinically appears euvolemic. Cardiology and nephrology were consulted.  Addendum.  Cardiology is advising holding amiodarone, increasing the dose of carvedilol and switching Eliquis with heparin.  Patient might need right heart cath to see the pressures.  They were also recommending involving GI as his liver abnormalities does not really match with hepatic congestion.  Message sent to  Dr.Vanga.  3/4: Hemodynamically stable.  Worsening renal function, nephrology is advising holding diuresis.  Clinically appears euvolemic but significant worsening of BNP in 1 day.  CK has been normalized.  T. bili seems stable with slight worsening of INR to 2. Patient might need milrinone for concern of low output heart failure, might need to go under advanced heart failure team, cardiology to decide regarding transfer.  3/5; vitals stable.  Renal function continued to get worse.  Sodium at 128 most likely due to worsening renal function.  Going for right heart catheterization for further investigation of his low output decompensated heart failure.  GI also recommended EGD to rule out any peptic ulcer disease causing his epigastric pain and if that is negative they were recommending general surgery evaluation for cholecystectomy as prior HIDA scan done last year with low EF.  They also started him on Creon for history of atrophic pancreas.  3/6: Right heart cath with low output heart failure and severely elevated pressures.  Patient was transferred to ICU and started on milrinone with IV diuresis.  Renal functions currently stable, net negative of -4 L over the past 24 hours.  Abdominal pain seems improving.  3/7: Vital stable with borderline blood pressure.  UOP recorded for as above 5 L with total net negative of -8605.  CVP 5-6, second dose of Lasix was held by cardiology.  Renal function seems stable.  Cardiology is planning DCCV tomorrow as he remained in A-fib.  Advanced heart failure team restarted amiodarone as elevated T. bili and mild transaminitis is most likely due to hepatic congestion with acute on chronic CHF.  Hepatic function today with mild transaminitis with  AST of 52, ALT 63 and T. bili stable at 4.6.  3/8: Hemodynamically stable with mildly elevated heart rate, remained in A-fib-going for DCCV today.  Currently on amiodarone.  Milrinone was stopped today, Lasix will be held due to  normal CVP at 6.  Slight increase in creatinine to 3.61 with stable GFR at 19.  UOP of 3300 recorded with net negative of -10,770.  Most likely be converted to torsemide per heart failure team for discharge.  Patient is very high risk for deterioration and mortality based on advance heart failure, worsening renal function and other underlying comorbidities.

## 2022-04-26 NOTE — Assessment & Plan Note (Addendum)
AKI with history of CKD stage IV.  Creatinine at 3.25 today with baseline around 3 Nephrology is on board. -Nephrology started low-dose torsemide today -Continue spironolactone

## 2022-04-26 NOTE — Progress Notes (Addendum)
Progress Note   Patient: Hector [name] [id number] DOB: 1965/06/22 DOA: 04/25/2022     0 DOS: the patient was seen and examined on 04/26/2022   Brief hospital course: Taken from H&P.   Hector [name] is a 57 y.o. male with medical history significant for A. fib on Eliquis, pancreatitis, sCHF with EF < 20%, CKD-IV, HTN, HLD, stroke, hypothyroidism, alcohol abuse in remission, GI bleeding, OSA, anemia, hospitalized 2 months ago with RSV who presents to the ED with cough and shortness of breath for the past 2 days as well as abdominal distention.  ED course and data review: BP 141/126 with heart rate in the low 100s.  Respirations 25-26 with O2 sat on room mid to high 90s.  Labs notable for troponin of 48, BNP pending.  CBC with normal BBC count platelets 1 38,000., respiratory viral panel negative.  Creatinine near baseline at 3.5, potassium 6.2, sodium 132.  Hepatic function panel with total bilirubin 4.8. EKG,.Reviewed and interpreted showing sinus at 106 with nonspecific ST-T wave changes chest x-ray shows.Findings suspicious for developing pulmonary edema. Patient treated with IV Lasix for CHF, given a dose of calcium gluconate and Veltassa for hyperkalemia.  3/3: Vitals stable.  Hyperkalemia has been resolved with potassium of 4.6, Veltassa discontinued.  Patient was on potassium supplement at home which should be discontinued. BNP elevated at 736, patient also has CKD stage IV.  Troponin mildly positive with a flat curve.  RUQ ultrasound with cholelithiasis and gallbladder sludge without complicating factors.  Findings also suspicious for hepatic cirrhosis.  Patient quit drinking 13 years ago. Worsening T. bili and INR.  Concern of cardiohepatic and cardiorenal. Clinically appears euvolemic. Cardiology and nephrology were consulted.  Addendum.  Cardiology is advising holding amiodarone, increasing the dose of carvedilol and switching Eliquis with heparin.  Patient might need right  heart cath to see the pressures.  They were also recommending involving GI as his liver abnormalities does not really match with hepatic congestion.  Message sent to [employer]  Patient is very high risk for deterioration and mortality based on advance heart failure, worsening renal function and other underlying comorbidities.  Assessment and Plan: * Acute on chronic systolic CHF (congestive heart failure) (HCC) Nonischemic cardiomyopathy, EF less than 20% not previously a candidate for ICD due to noncompliance Patient presents with shortness of breath, abdominal distention, with chest x-ray suspicious for developing pulmonary edema Troponin elevated to 48 but patient denies chest pain and EKG nonacute.  ACS not suspected Suspect medication noncompliance based on fill history for medication Holding Lasix and metolazone with worsening renal function. Continue carvedilol  Daily weights with intake and output monitoring Last echo October 2023 showed EF less than 20% Patient was previously deemed not to be ICD insertion by EP due to noncompliance  Patient was last seen by his cardiologist Dr. Haroldine Laws in December 2023 at which time metolazone was added to his diuretic regimen, weight was 247 pounds at the time [facility] vest Cardiology was consulted.  Nonischemic cardiomyopathy: EF 20-25% Please see above  Hyperkalemia Potassium was 6.2 on admission which has been improved. Received calcium gluconate and Veltassa in the ED Patient was on potassium supplement at home which need to be discontinued. Worsening renal function can be contributory. -Discontinuing Veltassa and keep monitoring  Atrial fibrillation with RVR (HCC) Heart rate in low 100.  Has had DC cardioversion in the past Continue amiodarone and carvedilol Continue apixaban for primary stroke prevention  Hyponatremia Clinically appears euvolemic with mildly  elevated BNP based on renal function. Can be due to diuretic use.  Mild  hyponatremia with sodium at 132 -Continue to monitor  Chronic renal impairment, stage 4 (severe) (HCC) AKI with history of CKD stage IV.  Creatinine at 3.38 today with baseline around 3 -Holding diuretic -Concern of cardiorenal -Nephrology consult  Hypothyroidism Continue levothyroxine  Type II diabetes mellitus with renal manifestations ([place]) Hold Invokana due to worsening renal function Sliding scale insulin coverage  Hyperbilirubinemia Bilirubin chronically elevated around 2.5 but now 4.8>>5.4 . INR 1.7 suspect related to hepatic congestion from CHF Had a HIDA scan July 2023 for bilirubin around 3 that showed "Abnormally low gallbladder ejection fraction equal to 6% indicative of decreased gallbladder emptying and gallbladder dysfunction" Renal ultrasound with concern of cirrhosis.  Thrombocytopenia (HCC) Mild thrombocytopenia which seems chronic.  -Continue to monitor  Alcohol use, unspecified, in remission Patient does not currently drink, per patient he quit drinking 13 years ago  History of GI bleed No acute concerns    Subjective: Patient was complaining of abdominal pain, mostly involving epigastric and periumbilical area.  No more nausea or vomiting.  Decreased appetite.  Physical Exam: Vitals:   04/26/22 0721 04/26/22 0915 04/26/22 1148 04/26/22 1211  BP:  (!) 144/115 (!) 128/112 108/88  Pulse:  (!) 115 (!) 116 (!) 101  Resp:  (!) 26 (!) 22 16  Temp:    98.2 F (36.8 C)  TempSrc:      SpO2:  95% 96% 97%  Weight: 110.5 kg     Height:       General.  Chronically ill-appearing gentleman, in no acute distress. Pulmonary.  Lungs clear bilaterally, normal respiratory effort. CV.  Regular rate and rhythm, no JVD, rub or murmur. Abdomen.  Soft, nontender, nondistended, BS positive. CNS.  Alert and oriented .  No focal neurologic deficit. Extremities.  No edema, no cyanosis, pulses intact and symmetrical. Psychiatry.  Judgment and insight appears normal.    Data Reviewed: Prior data reviewed  Family Communication: Unable to reach brother-only contact listed in his chart  Disposition: Status is: Inpatient Remains inpatient appropriate because: Severity of illness  Planned Discharge Destination: Home  DVT prophylaxis.  Eliquis Time spent: 50 minutes  This record has been created using Systems analyst. Errors have been sought and corrected,but may not always be located. Such creation errors do not reflect on the standard of care.   Author: Lorella Nimrod, MD 04/26/2022 4:12 PM  For on call review [email or web address].

## 2022-04-26 NOTE — Progress Notes (Addendum)
ANTICOAGULATION CONSULT NOTE - Initial Consult  Pharmacy Consult for IV heparin Indication: atrial fibrillation  Allergies  Allergen Reactions   Esomeprazole Magnesium Other (See Comments)    Suspected interstitial nephritis 2018   Eggs Or Egg-Derived Products Rash    Patient Measurements: Height: '5\' 11"'$  (180.3 cm) Weight: 110.5 kg (243 lb 8 oz) IBW/kg (Calculated) : 75.3 Heparin Dosing Weight: 99.2 kg  Vital Signs: Temp: 98.2 F (36.8 C) (03/03 1211) BP: 108/88 (03/03 1211) Pulse Rate: 101 (03/03 1211)  Labs: Recent Labs    04/25/22 1821 04/25/22 2045 04/26/22 0824  HGB 12.7*  --   --   HCT 40.7  --   --   PLT 138*  --   --   LABPROT  --   --  19.8*  INR  --   --  1.7*  CREATININE 3.15*  --  3.38*  TROPONINIHS 43* 48*  --     Estimated Creatinine Clearance: 30.5 mL/min (A) (by C-G formula based on SCr of 3.38 mg/dL (H)).   Medical History: Past Medical History:  Diagnosis Date   Alcohol abuse    Alcoholic cardiomyopathy ([place])    a. 12/2007 MV: EF 28%, no isch;  b. 8/12 Echo: EF 25-35%; c. 02/2014 Echo: EF 20-25%; d. 12/2014 Cath: minimal CAD; e. 01/2015 Echo: EF 50-55%;  d. 05/2016 Echo: EF 30-35%, diff HK, gr2 DD; e. 11/2016 Echo: EF 45-50%, diff HK; f. 09/2021 Echo: EF <20%; g. 11/2021 Echo: EF<20%.   Chronic combined systolic (congestive) and diastolic (congestive) heart failure ([place])    a. 05/2016 Echo: EF 30-35%; b. 11/2016 Echo: EF 45-50%, diff HK; c. 09/2021 Echo: EF < 20%; d. 11/2021 Echo: EF <20%, no rwma, Nl RV fxn, sev dil LA, mod-sev MR. mod dil PA w/ mildly elev PASP.   CKD (chronic kidney disease), stage III (HCC)    Elevated troponin (chronic)    Essential hypertension    GI bleed 11/2013   Hyperlipidemia    Pancreatitis    Paroxysmal A-fib (HCC)    a. new onset s/p unsuccessful TEE/DCCV on 08/16/2014; b. CHA2DS2VASc = 3-> eliquis (freq noncompliant); c. 02/2021 s/p TEE/DCCV; d. Prev on amio->d/c 2/2 abnl TFTs.   Paroxysmal atrial flutter (HCC)     Sleep-disordered breathing    Has yet to have a sleep study   Stroke [facility])     Medications:  Apixaban 5 mg BID PTA  Assessment: 57 year old male being admitted with acute on chronic systolic CHF. PMH includes Afib on Eliquis prior to admission, pancreatitis, sCHF with EF < 20%, CKD-IV, HTN, HLD, stroke, hypothyroidism, alcohol abuse in remission, GI bleeding, OSA, anemia, hospitalized 2 months ago with RSV. Pharmacy has been consulted to start IV heparin due to upcoming right heart cath.   H&H stable.   Last dose Eliquis 3/3 AM '@0905'$  (> 8 hours since last dose)  Goal of Therapy:  aPTT 66-102 seconds Monitor platelets by anticoagulation protocol: Yes   Plan:  Give 4500 units bolus x 1 Start heparin infusion at 1500 units/hr Check aPTT 8 hours (CrCl borderline at 30). Monitor via aPTT until correlation with heparin levels.  Continue to monitor H&H and platelets   Glean Salvo, PharmD, BCPS Clinical Pharmacist  04/26/2022 5:08 PM

## 2022-04-26 NOTE — Assessment & Plan Note (Signed)
Please see above

## 2022-04-26 NOTE — Assessment & Plan Note (Signed)
AKI with history of CKD stage IV.  Creatinine at 3.38 today with baseline around 3 -Holding diuretic -Concern of cardiorenal -Nephrology consult

## 2022-04-26 NOTE — Consult Note (Signed)
Cardiology Consultation   [name] MRN: [id number]; DOB: 31-May-1965  Admit date: 04/25/2022 Date of Consult: 04/26/2022  PCP:  [name], [place] [facility] Cardiologist  [place]     Patient Profile:   Hector [name] is a 57 y.o. male with a hx of NICM  who is being seen 04/26/2022 for the evaluation of CJF  at the request of .  History of Present Illness:   Hector [name]  is a 57 yo with hx of CAD, HFrEF, HTN, CKD, PAF(has had TEE/DCCV  in the past), HL, T2DM, EtOH abuse   Echo in Oct 2023 LVEF less than 20%  Mod / severe MR.  Pt not felt to be an ICD candidate.    Pt ws admitted in Sept 2023, November 2023 and Dec 2023 for CHF  Seen in CHF clinic in Dec 2023   Metalozone added to regimen.    The pt ws admitted for RSV earlier in year HE presented to ER with cough, SOB, abdominal swelling for 2 days, abdominal pain  He has not eaten in over 4 days     Vomited prior to admit   No F/C   No diarrhea.   In ERO2 sat high 90s on RA  BP 141/126  HR 100s   Cr 3.5  K 6.2  Na 132 Pt treated with IV lasix , Ca gluconate and Veltassa.   Pt continues to have R sided abdominal pain.   Breathing is short    No CP  Says he hs been taking meds except for yesterday    Past Medical History:  Diagnosis Date   Alcohol abuse    Alcoholic cardiomyopathy ([place])    a. 12/2007 MV: EF 28%, no isch;  b. 8/12 Echo: EF 25-35%; c. 02/2014 Echo: EF 20-25%; d. 12/2014 Cath: minimal CAD; e. 01/2015 Echo: EF 50-55%;  d. 05/2016 Echo: EF 30-35%, diff HK, gr2 DD; e. 11/2016 Echo: EF 45-50%, diff HK; f. 09/2021 Echo: EF <20%; g. 11/2021 Echo: EF<20%.   Chronic combined systolic (congestive) and diastolic (congestive) heart failure ([place])    a. 05/2016 Echo: EF 30-35%; b. 11/2016 Echo: EF 45-50%, diff HK; c. 09/2021 Echo: EF < 20%; d. 11/2021 Echo: EF <20%, no rwma, Nl RV fxn, sev dil LA, mod-sev MR. mod dil PA w/ mildly elev PASP.   CKD (chronic kidney disease), stage  III (HCC)    Elevated troponin (chronic)    Essential hypertension    GI bleed 11/2013   Hyperlipidemia    Pancreatitis    Paroxysmal A-fib (HCC)    a. new onset s/p unsuccessful TEE/DCCV on 08/16/2014; b. CHA2DS2VASc = 3-> eliquis (freq noncompliant); c. 02/2021 s/p TEE/DCCV; d. Prev on amio->d/c 2/2 abnl TFTs.   Paroxysmal atrial flutter (HCC)    Sleep-disordered breathing    Has yet to have a sleep study   Stroke [facility])     Past Surgical History:  Procedure Laterality Date   CARDIAC CATHETERIZATION N/A 01/09/2015   Procedure: Left Heart Cath and Coronary Angiography;  Surgeon: [name], MD;  Location: [place] CV [facility];  Service: Cardiovascular;  Laterality: N/A;   CARDIOVERSION N/A 03/05/2021   Procedure: CARDIOVERSION;  Surgeon: Kate Sable, MD;  Location: [facility] ORS;  Service: Cardiovascular;  Laterality: N/A;   COLONOSCOPY N/A 07/22/2020   Procedure: COLONOSCOPY;  Surgeon: Toledo, Benay Pike, MD;  Location: [facility] ENDOSCOPY;  Service: Gastroenterology;  Laterality: N/A;   ELECTROPHYSIOLOGIC STUDY  N/A 08/16/2014   Procedure: CARDIOVERSION;  Surgeon: [name], MD;  Location: [facility] ORS;  Service: Cardiovascular;  Laterality: N/A;   FLEXIBLE SIGMOIDOSCOPY N/A 10/10/2015   Procedure: FLEXIBLE SIGMOIDOSCOPY;  Surgeon: [name], MD;  Location: [facility] ENDOSCOPY;  Service: Endoscopy;  Laterality: N/A;   KNEE SURGERY Right    NM MYOVIEW LTD  November 2011   No ischemia or infarction. EF 50-55% ( no improvement from 2009 Myoview EF of 28%   TEE WITHOUT CARDIOVERSION N/A 08/16/2014   Procedure: TRANSESOPHAGEAL ECHOCARDIOGRAM (TEE);  Surgeon: [name], MD;  Location: [facility] ORS;  Service: Cardiovascular;  Laterality: N/A;   TEE WITHOUT CARDIOVERSION N/A 03/05/2021   Procedure: TRANSESOPHAGEAL ECHOCARDIOGRAM (TEE);  Surgeon: Kate Sable, MD;  Location: [facility] ORS;  Service: Cardiovascular;  Laterality: N/A;     Home Medications:  Prior to Admission medications    Medication Sig Start Date End Date Taking? Authorizing Provider  allopurinol (ZYLOPRIM) 100 MG tablet Take 0.5 tablets (50 mg total) by mouth daily. 10/03/21   Danford, Suann Larry, MD  amiodarone (PACERONE) 200 MG tablet Take 1 tablet (200 mg total) by mouth 2 (two) times daily. 02/02/22   Bensimhon, Shaune Pascal, MD  apixaban (ELIQUIS) 5 MG TABS tablet Take 1 tablet (5 mg total) by mouth 2 (two) times daily. 10/02/21   Danford, Suann Larry, MD  atorvastatin (LIPITOR) 80 MG tablet Take 1 tablet (80 mg total) by mouth daily. 11/24/21   Annita Brod, MD  canagliflozin [facility]) 100 MG TABS tablet Take 1 tablet (100 mg total) by mouth daily before breakfast. 04/06/22   Samella Parr, NP  carvedilol (COREG) 6.25 MG tablet Take 1 tablet (6.25 mg total) by mouth 2 (two) times daily with a meal. 10/02/21   Danford, Suann Larry, MD  ferrous gluconate (FERGON) 324 MG tablet Take 324 mg by mouth daily with breakfast. 12/22/21   [provider]  levothyroxine (SYNTHROID) 100 MCG tablet Take 1 tablet (100 mcg total) by mouth daily before breakfast. 11/24/21   Annita Brod, MD  metolazone (ZAROXOLYN) 2.5 MG tablet Take 1 tablet (2.5 mg total) by mouth daily. Patient taking differently: Take 2.5 mg by mouth once a week. On Fridays 02/02/22   Bensimhon, Shaune Pascal, MD  potassium chloride SA (KLOR-CON M) 20 MEQ tablet Take 1 tablet (20 mEq total) by mouth daily. Take an extra 2 tablets on Mon and Fri with metolazone. Patient taking differently: Take 20 mEq by mouth daily. Take an extra 2 tablets on Fri with metolazone. 02/02/22   Bensimhon, Shaune Pascal, MD  torsemide (DEMADEX) 20 MG tablet Take 3 tablets (60 mg total) by mouth daily. 03/05/22   Ezekiel Slocumb, DO  traMADol (ULTRAM) 50 MG tablet Take 1 tablet (50 mg total) by mouth every 6 (six) hours as needed. 03/23/22 03/23/23  Lavonia Drafts, MD    Inpatient Medications: Scheduled Meds:  amiodarone  200 mg Oral BID   apixaban  5 mg Oral  BID   atorvastatin  80 mg Oral Daily   carvedilol  6.25 mg Oral BID WC   ferrous gluconate  324 mg Oral Q breakfast   insulin aspart  0-5 Units Subcutaneous QHS   insulin aspart  0-9 Units Subcutaneous TID WC   levothyroxine  100 mcg Oral Q0600   Continuous Infusions:   Allergies:    Allergies  Allergen Reactions   Esomeprazole Magnesium Other (See Comments)    Suspected interstitial nephritis 2018   Eggs Or Egg-Derived  Products Rash    Social History:   Social History   Socioeconomic History   Marital status: Single    Spouse name: Not on file   Number of children: Not on file   Years of education: Not on file   Highest education level: Not on file  Occupational History   Not on file  Tobacco Use   Smoking status: Former    Packs/day: 1.00    Years: 12.00    Total pack years: 12.00    Types: Cigarettes    Quit date: 02/23/1998    Years since quitting: 24.1   Smokeless tobacco: Never  Vaping Use   Vaping Use: Never used  Substance and Sexual Activity   Alcohol use: Not Currently    Alcohol/week: 0.0 standard drinks of alcohol    Comment: Past heavy drinker   Drug use: No   Sexual activity: Not Currently  Other Topics Concern   Not on file  Social History Narrative   Not on file   Social Determinants of Health   Financial Resource Strain: Low Risk  (01/04/2019)   Overall Financial Resource Strain (CARDIA)    Difficulty of Paying Living Expenses: Not hard at all  Food Insecurity: No Food Insecurity (02/25/2022)   Hunger Vital Sign    Worried About Running Out of Food in the Last Year: Never true    Ran Out of Food in the Last Year: Never true  Transportation Needs: No Transportation Needs (02/25/2022)   PRAPARE - Hydrologist (Medical): No    Lack of Transportation (Non-Medical): No  Physical Activity: Inactive (01/04/2019)   Exercise Vital Sign    Days of Exercise per Week: 0 days    Minutes of Exercise per Session: 0 min   Stress: No Stress Concern Present (01/04/2019)   [place]    Feeling of Stress : Not at all  Social Connections: Moderately Isolated (01/04/2019)   Social Connection and Isolation Panel [NHANES]    Frequency of Communication with Friends and Family: More than three times a week    Frequency of Social Gatherings with Friends and Family: More than three times a week    Attends Religious Services: 1 to 4 times per year    Active Member of [employer] or Organizations: No    Attends Archivist Meetings: Never    Marital Status: Never married  Intimate Partner Violence: Not At Risk (02/25/2022)   Humiliation, Afraid, Rape, and Kick questionnaire    Fear of Current or Ex-Partner: No    Emotionally Abused: No    Physically Abused: No    Sexually Abused: No    Family History:    Family History  Problem Relation Age of Onset   Hypertension Mother    Hyperlipidemia Mother    Diabetes Mother      ROS:  Please see the history of present illness.   All other ROS reviewed and negative.     Physical Exam/Data:   Vitals:   04/26/22 0721 04/26/22 0915 04/26/22 1148 04/26/22 1211  BP:  (!) 144/115 (!) 128/112 108/88  Pulse:  (!) 115 (!) 116 (!) 101  Resp:  (!) 26 (!) 22 16  Temp:    98.2 F (36.8 C)  TempSrc:      SpO2:  95% 96% 97%  Weight: 110.5 kg     Height:        Intake/Output Summary (Last 24 hours)  at 04/26/2022 1335 Last data filed at 04/26/2022 0920 Gross per 24 hour  Intake 240 ml  Output 250 ml  Net -10 ml      04/26/2022    7:21 AM 04/25/2022    6:16 PM 03/23/2022    5:43 AM  Last 3 Weights  Weight (lbs) 243 lb 8 oz 245 lb 247 lb  Weight (kg) 110.451 kg 111.131 kg 112.038 kg     Body mass index is 33.96 kg/m.  General:  Well nourished, well developed, in no acute distress HEENT: normal Neck  JVP increased below jaw Cardiac  Irreg irreg   Tachy  No significant murmurs  Lungs: RElatively clear   Moving air   Abd:  Tender Rside/R UQ Ext: Tr LE  edema EKG:  The EKG was personally reviewed and demonstrates:  Atrial fibrillation  104 bpm   Occasional PVC.  Poor R wave progression.   Telemetry:  Telemetry was personally reviewed and demonstrates:  afib with PVCs  100s average   Relevant CV Studies: Echo October 2023  1. Left ventricular ejection fraction, by estimation, is <20%. The left  ventricle has severely decreased function. The left ventricle has no  regional wall motion abnormalities. The left ventricular internal cavity  size was mildly to moderately dilated.   2. Right ventricular systolic function is normal. The right ventricular  size is normal. There is mildly elevated pulmonary artery systolic  pressure.   3. Left atrial size was severely dilated.   4. Right atrial size was mildly dilated.   5. The mitral valve is normal in structure. Moderate to severe mitral  valve regurgitation. No evidence of mitral stenosis.   6. The aortic valve is normal in structure. Aortic valve regurgitation is  not visualized. No aortic stenosis is present.   7. Moderately dilated pulmonary artery.   8. The inferior vena cava is dilated in size with <50% respiratory  variability, suggesting right atrial pressure of 15 mmHg.   L heart cath 2016  Angiographically minimal CAD Non-ishcemic Cardiomyopathy Systemic Hypertension with Mild-Moderately increase LVEDP after pre-cath hydration.     This confirms the presumed diagnosis of Non-ischemic Cardiomyopathy.   Plan: Discharge home after Bed-rest & TR band removal Continue current Cardiomyopathy Medications - BB, Entresto & Lasix Will refer back to Dr. Caryl Comes for reconsideration of ICD.    Laboratory Data:  High Sensitivity Troponin:   Recent Labs  Lab 04/25/22 1821 04/25/22 2045  TROPONINIHS 43* 48*     Chemistry Recent Labs  Lab 04/25/22 1821 04/25/22 2328 04/26/22 0310 04/26/22 0712 04/26/22 0824  NA 132*  --   --    --  132*  K 6.2*   < > 5.1 4.6 4.5  CL 97*  --   --   --  99  CO2 25  --   --   --  18*  GLUCOSE 123*  --   --   --  103*  BUN 60*  --   --   --  63*  CREATININE 3.15*  --   --   --  3.38*  CALCIUM 8.6*  --   --   --  8.8*  MG 2.5*  --   --   --   --   GFRNONAA 22*  --   --   --  20*  ANIONGAP 10  --   --   --  15   < > = values in this interval not displayed.  Recent Labs  Lab 04/25/22 1821 04/26/22 0824  PROT 7.7 6.8  ALBUMIN 3.5 3.5  AST 33 23  ALT 13 16  ALKPHOS 94 93  BILITOT 4.8* 5.4*   Lipids No results for input(s): "CHOL", "TRIG", "HDL", "LABVLDL", "LDLCALC", "CHOLHDL" in the last 168 hours.  Hematology Recent Labs  Lab 04/25/22 1821  WBC 8.0  RBC 4.67  HGB 12.7*  HCT 40.7  MCV 87.2  MCH 27.2  MCHC 31.2  RDW 20.6*  PLT 138*   Thyroid No results for input(s): "TSH", "FREET4" in the last 168 hours.  BNPNo results for input(s): "BNP", "PROBNP" in the last 168 hours.  DDimer No results for input(s): "DDIMER" in the last 168 hours.   Radiology/Studies:  US Abdomen Limited RUQ (LIVER/GB)  Result Date: 04/26/2022 CLINICAL DATA:  Elevated bilirubin EXAM: ULTRASOUND ABDOMEN LIMITED RIGHT UPPER QUADRANT COMPARISON:  02/23/2022 CT FINDINGS: Gallbladder: Gallbladder is well distended with evidence of gallbladder sludge and cholelithiasis. Negative sonographic Murphy's sign is noted. Common bile duct: Diameter: 3.6 mm. Liver: Mild nodularity in the liver is noted suspicious for underlying cirrhosis. No focal mass is noted. Portal vein is patent on color Doppler imaging with normal direction of blood flow towards the liver. Other: None. IMPRESSION: Cholelithiasis and gallbladder sludge without complicating factors. Findings suspicious for hepatic cirrhosis. Electronically Signed   By: Inez Catalina M.D.   On: 04/26/2022 02:10   DG Chest 2 View  Result Date: 04/25/2022 CLINICAL DATA:  SOB EXAM: CHEST - 2 VIEW COMPARISON:  Chest XR, 02/23/2022.  CT chest, 01/06/2022.  FINDINGS: Cardiac silhouette is mildly enlarged. Lungs are hypoinflated. Perihilar interstitial thickening and trace fluid layering along minor fissure. No focal consolidation or mass. No pleural effusion or pneumothorax. No acute displaced fracture. IMPRESSION: Cardiomegaly and perihilar thickening, with layering fluid along the fissure. Findings suspicious for developing pulmonary edema. Electronically Signed   By: Michaelle Birks M.D.   On: 04/25/2022 18:54     Assessment and Plan:   HFrEF  Pt with NICM and sever eLV dysfunction. Has not eaten in days   ON exam, JVP is increased and pt has RUQ tenderess.   LVier panel shows elevated bilirubin (5.4 today ) but transaminases are normal (23, 16) going against low output state   Pain on R side/R UQ is also very prominent   Pain is limiting eating    Patient could benefit from either PICC line for COOX but with poor renal function, possible dialysis down the road would not do this  Instead would favor R heart cath at some point to get filling pressure/cardiac output ON coreg now  Continue for some rate control   2  RUQ discomfort RUQ USN noted above   Bili elevated as noted above   INR is also elevated 1.7   Albumin, alk phos, transaminases and platelets normal   I would stop lipitor, hold amiodarone for now  Consider asking GI to see pt     3.  Atrial fibrillation  Rates are mildly elevated    Keep on carvedilol  for now  I would d/c amiodarone for now   I would stop Eliquis and start heparin for now in case he has a procedure   4  Troponin elevation  Trivial, flat  Most likely represents demand in setting of poor LV function and ongoing medical problems  5  CAD   Minimal nonobstructive CAD by cath in 2016     4  REnal  BUN/CR 63/3.38  Follow   Cr hs been very labile   3s-4s   Given lasix yesterday  HOld further dosing for now     5 Hx CK elevation  previous admit   REcheck           For questions or updates, please contact [place] Please consult [email or web address] for contact info under    Signed, Dorris Carnes, MD  04/26/2022 1:35 PM

## 2022-04-27 DIAGNOSIS — R1013 Epigastric pain: Secondary | ICD-10-CM | POA: Diagnosis not present

## 2022-04-27 DIAGNOSIS — K7030 Alcoholic cirrhosis of liver without ascites: Secondary | ICD-10-CM

## 2022-04-27 DIAGNOSIS — E875 Hyperkalemia: Secondary | ICD-10-CM | POA: Diagnosis not present

## 2022-04-27 DIAGNOSIS — I4891 Unspecified atrial fibrillation: Secondary | ICD-10-CM | POA: Diagnosis not present

## 2022-04-27 DIAGNOSIS — I5023 Acute on chronic systolic (congestive) heart failure: Secondary | ICD-10-CM | POA: Diagnosis not present

## 2022-04-27 DIAGNOSIS — I428 Other cardiomyopathies: Secondary | ICD-10-CM | POA: Diagnosis not present

## 2022-04-27 LAB — IRON AND TIBC
Iron: 107 ug/dL (ref 45–182)
Saturation Ratios: 41 % — ABNORMAL HIGH (ref 17.9–39.5)
TIBC: 259 ug/dL (ref 250–450)
UIBC: 152 ug/dL

## 2022-04-27 LAB — CBC
HCT: 36.2 % — ABNORMAL LOW (ref 39.0–52.0)
Hemoglobin: 11.8 g/dL — ABNORMAL LOW (ref 13.0–17.0)
MCH: 27.4 pg (ref 26.0–34.0)
MCHC: 32.6 g/dL (ref 30.0–36.0)
MCV: 84.2 fL (ref 80.0–100.0)
Platelets: 230 10*3/uL (ref 150–400)
RBC: 4.3 MIL/uL (ref 4.22–5.81)
RDW: 20.7 % — ABNORMAL HIGH (ref 11.5–15.5)
WBC: 8.2 10*3/uL (ref 4.0–10.5)
nRBC: 0.4 % — ABNORMAL HIGH (ref 0.0–0.2)

## 2022-04-27 LAB — BASIC METABOLIC PANEL
Anion gap: 14 (ref 5–15)
BUN: 67 mg/dL — ABNORMAL HIGH (ref 6–20)
CO2: 22 mmol/L (ref 22–32)
Calcium: 8.3 mg/dL — ABNORMAL LOW (ref 8.9–10.3)
Chloride: 96 mmol/L — ABNORMAL LOW (ref 98–111)
Creatinine, Ser: 3.61 mg/dL — ABNORMAL HIGH (ref 0.61–1.24)
GFR, Estimated: 19 mL/min — ABNORMAL LOW (ref >60)
Glucose, Bld: 109 mg/dL — ABNORMAL HIGH (ref 70–99)
Potassium: 4.1 mmol/L (ref 3.5–5.1)
Sodium: 132 mmol/L — ABNORMAL LOW (ref 135–145)

## 2022-04-27 LAB — HEPATIC FUNCTION PANEL
ALT: 18 U/L (ref 0–44)
AST: 23 U/L (ref 15–41)
Albumin: 3.1 g/dL — ABNORMAL LOW (ref 3.5–5.0)
Alkaline Phosphatase: 88 U/L (ref 38–126)
Bilirubin, Direct: 2.5 mg/dL — ABNORMAL HIGH (ref 0.0–0.2)
Indirect Bilirubin: 2.6 mg/dL — ABNORMAL HIGH (ref 0.3–0.9)
Total Bilirubin: 5.1 mg/dL — ABNORMAL HIGH (ref 0.3–1.2)
Total Protein: 6.5 g/dL (ref 6.5–8.1)

## 2022-04-27 LAB — APTT
aPTT: 139 seconds — ABNORMAL HIGH (ref 24–36)
aPTT: 79 seconds — ABNORMAL HIGH (ref 24–36)
aPTT: 54 seconds — ABNORMAL HIGH (ref 24–36)

## 2022-04-27 LAB — FERRITIN: Ferritin: 545 ng/mL — ABNORMAL HIGH (ref 24–336)

## 2022-04-27 LAB — FOLATE: Folate: 10.8 ng/mL (ref >5.9)

## 2022-04-27 LAB — HEPARIN LEVEL (UNFRACTIONATED): Heparin Unfractionated: >1.1 IU/mL — ABNORMAL HIGH (ref 0.30–0.70)

## 2022-04-27 MED ORDER — POLYETHYLENE GLYCOL 3350 17 G PO PACK
34.0000 g | PACK | Freq: Two times a day (BID) | ORAL | Status: DC
  Administered 2022-04-27 – 2022-04-29 (×2): 34 g via ORAL
  Filled 2022-04-27 (×4): qty 2

## 2022-04-27 MED ORDER — HEPARIN BOLUS VIA INFUSION
3000.0000 [IU] | Freq: Once | INTRAVENOUS | Status: AC
  Administered 2022-04-27: 3000 [IU] via INTRAVENOUS
  Filled 2022-04-27: qty 3000

## 2022-04-27 MED ORDER — SODIUM CHLORIDE 0.9% FLUSH
3.0000 mL | Freq: Two times a day (BID) | INTRAVENOUS | Status: DC
  Administered 2022-04-27 – 2022-05-03 (×13): 3 mL via INTRAVENOUS

## 2022-04-27 MED ORDER — PANCRELIPASE (LIP-PROT-AMYL) 36000-114000 UNITS PO CPEP
36000.0000 [IU] | ORAL_CAPSULE | Freq: Three times a day (TID) | ORAL | Status: DC
  Administered 2022-04-28 – 2022-05-04 (×16): 36000 [IU] via ORAL
  Filled 2022-04-27 (×2): qty 1
  Filled 2022-04-27 (×4): qty 3
  Filled 2022-04-27 (×3): qty 1
  Filled 2022-04-27: qty 3
  Filled 2022-04-27 (×4): qty 1
  Filled 2022-04-27: qty 3
  Filled 2022-04-27: qty 1
  Filled 2022-04-27: qty 3
  Filled 2022-04-27 (×2): qty 1

## 2022-04-27 MED ORDER — HEPARIN (PORCINE) 25000 UT/250ML-% IV SOLN
1400.0000 [IU]/h | INTRAVENOUS | Status: DC
  Administered 2022-04-27: 1200 [IU]/h via INTRAVENOUS
  Administered 2022-04-28: 1300 [IU]/h via INTRAVENOUS
  Administered 2022-04-28: 1500 [IU]/h via INTRAVENOUS
  Administered 2022-04-29 – 2022-05-02 (×5): 1400 [IU]/h via INTRAVENOUS
  Filled 2022-04-27 (×7): qty 250

## 2022-04-27 NOTE — Consult Note (Addendum)
   Heart Failure Nurse Navigator Note  HFrEF less than 20%.  Ventricular internal cavity is mild to moderately dilated.  Normal right ventricular systolic function.  Mildly elevated pulmonary artery systolic pressure.  Severely dilated left atria.  Mildly dilated right atria.  Moderate to severe mitral regurgitation.  He presented to the emergency room with complaints of vomiting and shortness of breath.  Also noted abdominal distention.  BNP 2109.  Chest x-ray with cardiomegaly and findings suspicious for developing pulmonary edema.  Potassium level 6.2  Comorbidities:  Atrial fibrillation on Eliquis Pancreatitis Chronic kidney disease stage IV Hypertension Hyperlipidemia Stroke Hypothyroidism Remission of alcoholism Obstructive sleep apnea GI bleed  Medications:  Carvedilol 12.5 mg 2 times a day Levothyroxine 100 mg daily Heparin  infusion   Labs:  Sodium 136, potassium 4.1, chloride 96, CO2 26, BUN 67, creatinine 3.61, GFR 19 Weight is 108.3 kg Intake 890 mL Output 2175 mL    Initial meeting with patient on this admission.  He was lying quietly in bed in no acute distress.  Discussed how he takes care of himself at home.  He admits to not weighing daily due to being "lazy".  He has been given two scales  weigh himself by the outpatient heart failure clinic.  He admits that he also does not keep track of the amount of liquids that he drinks daily due to the above reason.  He states that he is not drinking as much soda's as he was but admits to drinking a lot of tea and water.  Went over the importance of sticking with fluid restriction of no more than 64 ounces daily.  He realizes he is being lazy and not taking care of himself.  Then in the next breath states he is tired of all this admissions.  Talked about what he needs to do to stay out of the hospital.  He is making eye contact today.  He also relates that he and his brother do not like to cook so if his brother's  girlfriend is not there to fix the meals then they order out.  He did not feel that his brother's girlfriend cooks with salt.  Also talked about follow-up in the outpatient heart failure clinic and that he had missed his last appointment with Dr. Haroldine Laws.  States that he was not aware that he had an appointment.  Will make appointment to be seen by advanced heart failure doctor.  Will continue to follow.  Pricilla Riffle RN [facility]

## 2022-04-27 NOTE — Progress Notes (Signed)
[facility]  ROUNDING NOTE   Subjective:   Hector [name] is a 57 y.o. male with past medical conditions including hypertension, paroxysmal A-fib on Eliquis, CHF with a EF less than 20%, and chronic kidney disease stage IV. Patient presents to the emergency department with lower extremity edema and shortness of breath. He has been admitted for Hyperkalemia [E87.5] CHF exacerbation (HCC) [I50.9] Stage 4 chronic kidney disease ([place]) [N18.4] Acute on chronic congestive heart failure, unspecified heart failure type [facility]) [I50.9]  Patient is known to our practice from previous hospital admissions and is followed outpatient by [facility] nephrology.  Patient states he has maintained all medications, torsemide and metolazone.  Patient states he is not good with his fluid restriction.  States he began noticing his feet swelling a few days ago.  Reports mild nausea without vomiting.  Denies shortness of breath, room air.  Productive cough.  No lower extremity edema  Labs on ED arrival significant for sodium 132, potassium 6.2, glucose 123, BUN 60, creatinine 3.15 with GFR 22, magnesium 2.5, troponin 43, and hemoglobin 12.7.  Respiratory panel negative for influenza, COVID-19, and RSV.  Chest x-ray suspicious for pulmonary edema.  We have been consulted to manage acute kidney injury.   Objective:  Vital signs in last 24 hours:  Temp:  [97.4 F (36.3 C)-97.8 F (36.6 C)] 97.4 F (36.3 C) (03/04 1255) Pulse Rate:  [42-109] 88 (03/04 1255) Resp:  [16-22] 18 (03/04 1255) BP: (101-125)/(79-98) 104/79 (03/04 1255) SpO2:  [93 %-100 %] 100 % (03/04 1255) Weight:  [108.3 kg] 108.3 kg (03/04 0500)  Weight change: -0.68 kg Filed Weights   04/25/22 1816 04/26/22 0721 04/27/22 0500  Weight: 111.1 kg 110.5 kg 108.3 kg    Intake/Output: I/O last 3 completed shifts: In: 890.3 [P.O.:720; I.V.:170.3] Out: 2175 [Urine:2175]   Intake/Output this shift:  Total I/O In: -  Out: 150  [Urine:150]  Physical Exam: General: NAD  Head: Normocephalic, atraumatic. Moist oral mucosal membranes  Eyes: Anicteric  Lungs:  Clear to auscultation, normal effort  Heart: Regular rate and rhythm  Abdomen:  Soft, nontender, nondistended  Extremities: No peripheral edema.  Neurologic: Nonfocal, moving all four extremities  Skin: No lesions  Access: None    Basic Metabolic Panel: Recent Labs  Lab 04/25/22 1821 04/25/22 2328 04/26/22 0310 04/26/22 0712 04/26/22 0824 04/27/22 0259  NA 132*  --   --   --  132* 132*  K 6.2* 5.5* 5.1 4.6 4.5 4.1  CL 97*  --   --   --  99 96*  CO2 25  --   --   --  18* 22  GLUCOSE 123*  --   --   --  103* 109*  BUN 60*  --   --   --  63* 67*  CREATININE 3.15*  --   --   --  3.38* 3.61*  CALCIUM 8.6*  --   --   --  8.8* 8.3*  MG 2.5*  --   --   --   --   --     Liver Function Tests: Recent Labs  Lab 04/25/22 1821 04/26/22 0824 04/27/22 0259  AST 33 23 23  ALT '13 16 18  '$ ALKPHOS 94 93 88  BILITOT 4.8* 5.4* 5.1*  PROT 7.7 6.8 6.5  ALBUMIN 3.5 3.5 3.1*   Recent Labs  Lab 04/25/22 1821  LIPASE 24   No results for input(s): "AMMONIA" in the last 168 hours.  CBC: Recent  Labs  Lab 04/25/22 1821 04/26/22 1646 04/27/22 0259  WBC 8.0 7.8 8.2  HGB 12.7* 11.9* 11.8*  HCT 40.7 36.9* 36.2*  MCV 87.2 83.9 84.2  PLT 138* 223 230    Cardiac Enzymes: Recent Labs  Lab 04/26/22 1646  CKTOTAL 291    BNP: Invalid input(s): "POCBNP"  CBG: No results for input(s): "GLUCAP" in the last 168 hours.  Microbiology: Results for orders placed or performed during the hospital encounter of 04/25/22  Resp panel by RT-PCR (RSV, Flu A&B, Covid) Anterior Nasal Swab     Status: None   Collection Time: 04/25/22  8:39 PM   Specimen: Anterior Nasal Swab  Result Value Ref Range Status   SARS Coronavirus 2 by RT PCR NEGATIVE NEGATIVE Final    Comment: (NOTE) SARS-CoV-2 target nucleic acids are NOT DETECTED.  The SARS-CoV-2 RNA is generally  detectable in upper respiratory specimens during the acute phase of infection. The lowest concentration of SARS-CoV-2 viral copies this assay can detect is 138 copies/mL. A negative result does not preclude SARS-Cov-2 infection and should not be used as the sole basis for treatment or other patient management decisions. A negative result may occur with  improper specimen collection/handling, submission of specimen other than nasopharyngeal swab, presence of viral mutation(s) within the areas targeted by this assay, and inadequate number of viral copies(<138 copies/mL). A negative result must be combined with clinical observations, patient history, and epidemiological information. The expected result is Negative.  Fact Sheet for Patients:  EntrepreneurPulse.com.au  Fact Sheet for Healthcare Providers:  IncredibleEmployment.be  This test is no t yet approved or cleared by the [place] FDA and  has been authorized for detection and/or diagnosis of SARS-CoV-2 by FDA under an Emergency Use Authorization (EUA). This EUA will remain  in effect (meaning this test can be used) for the duration of the COVID-19 declaration under Section 564(b)(1) of the Act, 21 U.S.C.section 360bbb-3(b)(1), unless the authorization is terminated  or revoked sooner.       Influenza A by PCR NEGATIVE NEGATIVE Final   Influenza B by PCR NEGATIVE NEGATIVE Final    Comment: (NOTE) The Xpert Xpress SARS-CoV-2/FLU/RSV plus assay is intended as an aid in the diagnosis of influenza from Nasopharyngeal swab specimens and should not be used as a sole basis for treatment. Nasal washings and aspirates are unacceptable for Xpert Xpress SARS-CoV-2/FLU/RSV testing.  Fact Sheet for Patients: EntrepreneurPulse.com.au  Fact Sheet for Healthcare Providers: IncredibleEmployment.be  This test is not yet approved or cleared by the [place] FDA  and has been authorized for detection and/or diagnosis of SARS-CoV-2 by FDA under an Emergency Use Authorization (EUA). This EUA will remain in effect (meaning this test can be used) for the duration of the COVID-19 declaration under Section 564(b)(1) of the Act, 21 U.S.C. section 360bbb-3(b)(1), unless the authorization is terminated or revoked.     Resp Syncytial Virus by PCR NEGATIVE NEGATIVE Final    Comment: (NOTE) Fact Sheet for Patients: EntrepreneurPulse.com.au  Fact Sheet for Healthcare Providers: IncredibleEmployment.be  This test is not yet approved or cleared by the [place] FDA and has been authorized for detection and/or diagnosis of SARS-CoV-2 by FDA under an Emergency Use Authorization (EUA). This EUA will remain in effect (meaning this test can be used) for the duration of the COVID-19 declaration under Section 564(b)(1) of the Act, 21 U.S.C. section 360bbb-3(b)(1), unless the authorization is terminated or revoked.  Performed at [facility], [address], [place], [place] [address]  Coagulation Studies: Recent Labs    04/26/22 0824 04/26/22 1646  LABPROT 19.8* 22.1*  INR 1.7* 2.0*    Urinalysis: No results for input(s): "COLORURINE", "LABSPEC", "PHURINE", "GLUCOSEU", "HGBUR", "BILIRUBINUR", "KETONESUR", "PROTEINUR", "UROBILINOGEN", "NITRITE", "LEUKOCYTESUR" in the last 72 hours.  Invalid input(s): "APPERANCEUR"    Imaging: US Abdomen Limited RUQ (LIVER/GB)  Result Date: 04/26/2022 CLINICAL DATA:  Elevated bilirubin EXAM: ULTRASOUND ABDOMEN LIMITED RIGHT UPPER QUADRANT COMPARISON:  02/23/2022 CT FINDINGS: Gallbladder: Gallbladder is well distended with evidence of gallbladder sludge and cholelithiasis. Negative sonographic Murphy's sign is noted. Common bile duct: Diameter: 3.6 mm. Liver: Mild nodularity in the liver is noted suspicious for underlying cirrhosis. No focal mass is noted. Portal vein  is patent on color Doppler imaging with normal direction of blood flow towards the liver. Other: None. IMPRESSION: Cholelithiasis and gallbladder sludge without complicating factors. Findings suspicious for hepatic cirrhosis. Electronically Signed   By: Inez Catalina M.D.   On: 04/26/2022 02:10   DG Chest 2 View  Result Date: 04/25/2022 CLINICAL DATA:  SOB EXAM: CHEST - 2 VIEW COMPARISON:  Chest XR, 02/23/2022.  CT chest, 01/06/2022. FINDINGS: Cardiac silhouette is mildly enlarged. Lungs are hypoinflated. Perihilar interstitial thickening and trace fluid layering along minor fissure. No focal consolidation or mass. No pleural effusion or pneumothorax. No acute displaced fracture. IMPRESSION: Cardiomegaly and perihilar thickening, with layering fluid along the fissure. Findings suspicious for developing pulmonary edema. Electronically Signed   By: Michaelle Birks M.D.   On: 04/25/2022 18:54     Medications:    heparin 1,200 Units/hr (04/27/22 0516)    carvedilol  12.5 mg Oral BID WC   ferrous gluconate  324 mg Oral Q breakfast   insulin aspart  0-5 Units Subcutaneous QHS   insulin aspart  0-9 Units Subcutaneous TID WC   levothyroxine  100 mcg Oral Q0600   acetaminophen **OR** acetaminophen, ondansetron **OR** ondansetron (ZOFRAN) IV, oxyCODONE  Assessment/ Plan:  Mr. BLAND PRASKA is a 57 y.o.  male with past medical conditions including hypertension, paroxysmal A-fib on Eliquis, CHF with a EF less than 20%, and chronic kidney disease stage IV. Patient presents to the emergency department with lower extremity edema and shortness of breath. He has been admitted for   Acute Kidney Injury with hyponatremia on chronic kidney disease stage 4 with baseline creatinine 2.83 and GFR of 25 on 03/02/22.  Acute kidney injury secondary to fluid overload and cardiorenal.  And chronic kidney disease is secondary to hypertension.  Diuretics held. No acute need for dialysis at this time.   Lab Results   Component Value Date   CREATININE 3.61 (H) 04/27/2022   CREATININE 3.38 (H) 04/26/2022   CREATININE 3.15 (H) 04/25/2022    Intake/Output Summary (Last 24 hours) at 04/27/2022 1524 Last data filed at 04/27/2022 0752 Gross per 24 hour  Intake 650.34 ml  Output 1350 ml  Net -699.66 ml   2. Anemia of chronic kidney disease Lab Results  Component Value Date   HGB 11.8 (L) 04/27/2022    Hgb within desired range. Will monitor.   3. Acute on chronic systolic and diastolic heart failure. ECHO from 12/08/2021 shows EF less than 20% with a moderate to severe MVR. Diuretics held    LOS: 1 [place] 3/4/20243:24 PM

## 2022-04-27 NOTE — Progress Notes (Signed)
Patient refusing CBGs and insulin. MD aware.

## 2022-04-27 NOTE — Progress Notes (Signed)
ANTICOAGULATION CONSULT NOTE  Pharmacy Consult for IV heparin Indication: atrial fibrillation  Allergies  Allergen Reactions   Esomeprazole Magnesium Other (See Comments)    Suspected interstitial nephritis 2018   Eggs Or Egg-Derived Products Rash    Patient Measurements: Height: '5\' 11"'$  (180.3 cm) Weight: 108.3 kg (238 lb 10.4 oz) IBW/kg (Calculated) : 75.3 Heparin Dosing Weight: 99.2 kg  Vital Signs: Temp: 97.9 F (36.6 C) (03/04 1945) Temp Source: Oral (03/04 1945) BP: 100/72 (03/04 1945) Pulse Rate: 64 (03/04 1945)  Labs: Recent Labs    04/25/22 1821 04/25/22 1821 04/25/22 2045 04/26/22 0824 04/26/22 1646 04/26/22 1839 04/27/22 0259 04/27/22 1321 04/27/22 2142  HGB 12.7*  --   --   --  11.9*  --  11.8*  --   --   HCT 40.7  --   --   --  36.9*  --  36.2*  --   --   PLT 138*  --   --   --  223  --  230  --   --   APTT  --    < >  --   --  41*  --  139* 79* 54*  LABPROT  --   --   --  19.8* 22.1*  --   --   --   --   INR  --   --   --  1.7* 2.0*  --   --   --   --   HEPARINUNFRC  --   --   --   --   --  >1.10* >1.10*  --   --   CREATININE 3.15*  --   --  3.38*  --   --  3.61*  --   --   CKTOTAL  --   --   --   --  291  --   --   --   --   TROPONINIHS 43*  --  48*  --   --   --   --   --   --    < > = values in this interval not displayed.    Estimated Creatinine Clearance: 28.3 mL/min (A) (by C-G formula based on SCr of 3.61 mg/dL (H)).  Medical History: Past Medical History:  Diagnosis Date   Alcohol abuse    Alcoholic cardiomyopathy ([place])    a. 12/2007 MV: EF 28%, no isch;  b. 8/12 Echo: EF 25-35%; c. 02/2014 Echo: EF 20-25%; d. 12/2014 Cath: minimal CAD; e. 01/2015 Echo: EF 50-55%;  d. 05/2016 Echo: EF 30-35%, diff HK, gr2 DD; e. 11/2016 Echo: EF 45-50%, diff HK; f. 09/2021 Echo: EF <20%; g. 11/2021 Echo: EF<20%.   Chronic combined systolic (congestive) and diastolic (congestive) heart failure ([place])    a. 05/2016 Echo: EF 30-35%; b. 11/2016 Echo: EF 45-50%,  diff HK; c. 09/2021 Echo: EF < 20%; d. 11/2021 Echo: EF <20%, no rwma, Nl RV fxn, sev dil LA, mod-sev MR. mod dil PA w/ mildly elev PASP.   CKD (chronic kidney disease), stage III (HCC)    Elevated troponin (chronic)    Essential hypertension    GI bleed 11/2013   Hyperlipidemia    Pancreatitis    Paroxysmal A-fib (HCC)    a. new onset s/p unsuccessful TEE/DCCV on 08/16/2014; b. CHA2DS2VASc = 3-> eliquis (freq noncompliant); c. 02/2021 s/p TEE/DCCV; d. Prev on amio->d/c 2/2 abnl TFTs.   Paroxysmal atrial flutter (HCC)    Sleep-disordered breathing    Has yet  to have a sleep study   Stroke [name])     Medications:  Apixaban 5 mg BID PTA  Assessment: 57 year old male being admitted with acute on chronic systolic CHF. PMH includes Afib on Eliquis prior to admission, pancreatitis, sCHF with EF < 20%, CKD-IV, HTN, HLD, stroke, hypothyroidism, alcohol abuse in remission, GI bleeding, OSA, anemia, hospitalized 2 months ago with RSV.  Pharmacy has been consulted to start IV heparin due to possible procedure H&H stable.   Last dose Eliquis 3/3 AM '@0905'$  (> 8 hours since last dose)  Goal of Therapy:  aPTT 66-102 seconds Monitor platelets by anticoagulation protocol: Yes   Results: 3/4 @ 0259:  aPTT = 139,  HL = > 1.10 3/4 '@1321'$  aPTT = 79, Therapeutic x 1, heparin rate 1200 units/hr 3/4 2142 aPTT 54, subtherapeutic  Plan:  Bolus 3000 units x 1 Increase heparin infusion to 1500 units/hr. Recheck aPTT in 8 hrs after rate change Use aPTT to dose heparin until correlating with HL. Recheck HL daily until correlation confirmed Continue to monitor daily H&H and platelets while infusion  Renda Rolls, PharmD, [facility] ([place]) 04/27/2022 10:50 PM

## 2022-04-27 NOTE — Consult Note (Signed)
Cephas Darby, MD [address]  [place]  [place], [place] [address]  Main: [phone]  Fax: [phone] Pager: [phone]   Consultation  Referring Provider:     No ref. provider found Primary Care Physician:  [name], MD Primary Gastroenterologist: Dr. Alice Reichert        Reason for Consultation: Isolated T. bili, cirrhosis of liver  Date of Admission:  04/25/2022 Date of Consultation:  04/27/2022         HPI:   Hector [name] is a 57 y.o. 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  Patient underwent CT abdomen and pelvis without contrast which did not reveal any acute intra-abdominal pathology, there was evidence of large volume of stool in the rectal vault, cholelithiasis, gallbladder sludge without evidence of acute cholecystitis, mild diffuse atrophy of the pancreas, mildly distended gallbladder, mild nodularity of the liver suspicious for underlying cirrhosis, patent portal vein with normal directional blood flow.  His labs reveal isolated elevated total bilirubin of 4.8 compared to baseline 1.9 on 02/23/2022.  D bili 2.5, indirect bili 2.6, AST, ALT, alkaline phosphatase normal.  CBC revealed mild normocytic anemia, normal platelet count.  Lipase normal GI is consulted for elevated total bilirubin and ?  Cirrhosis of liver  When interviewed the patient, he was complaining of abdominal pain, pointing his hand to the epigastric area, Denies any pain radiating to the right upper quadrant or to the back or shoulder blades, denies any nausea or vomiting.  Denies any constipation or diarrhea.  Reports abdominal distention, feels bloated  NSAIDs: None  Antiplts/Anticoagulants/Anti thrombotics: Eliquis for history of A-fib  GI Procedures: Colonoscopy 07/22/2020 for  hematochezia Blood in the entire colon  Past Medical History:  Diagnosis Date   Alcohol abuse    Alcoholic cardiomyopathy ([place])    a. 12/2007 MV: EF 28%, no isch;  b. 8/12 Echo: EF 25-35%; c. 02/2014 Echo: EF 20-25%; d. 12/2014 Cath: minimal CAD; e. 01/2015 Echo: EF 50-55%;  d. 05/2016 Echo: EF 30-35%, diff HK, gr2 DD; e. 11/2016 Echo: EF 45-50%, diff HK; f. 09/2021 Echo: EF <20%; g. 11/2021 Echo: EF<20%.   Chronic combined systolic (congestive) and diastolic (congestive) heart failure ([place])    a. 05/2016 Echo: EF 30-35%; b. 11/2016 Echo: EF 45-50%, diff HK; c. 09/2021 Echo: EF < 20%; d. 11/2021 Echo: EF <20%, no rwma, Nl RV fxn, sev dil LA, mod-sev MR. mod dil PA w/ mildly elev PASP.   CKD (chronic kidney disease), stage III (HCC)    Elevated troponin (chronic)    Essential hypertension    GI bleed 11/2013   Hyperlipidemia    Pancreatitis    Paroxysmal A-fib (HCC)    a. new onset s/p unsuccessful TEE/DCCV on 08/16/2014; b. CHA2DS2VASc = 3-> eliquis (freq noncompliant); c. 02/2021 s/p TEE/DCCV; d. Prev on amio->d/c 2/2 abnl TFTs.   Paroxysmal atrial flutter (HCC)    Sleep-disordered breathing    Has yet to have a sleep study   Stroke [facility])     Past Surgical History:  Procedure Laterality Date   CARDIAC CATHETERIZATION N/A 01/09/2015   Procedure: Left Heart Cath and Coronary Angiography;  Surgeon: [name], MD;  Location: [facility] LAB;  Service: Cardiovascular;  Laterality: N/A;   CARDIOVERSION N/A 03/05/2021   Procedure: CARDIOVERSION;  Surgeon: Kate Sable,  MD;  Location: [facility] ORS;  Service: Cardiovascular;  Laterality: N/A;   COLONOSCOPY N/A 07/22/2020   Procedure: COLONOSCOPY;  Surgeon: Toledo, Benay Pike, MD;  Location: [facility] ENDOSCOPY;  Service: Gastroenterology;  Laterality: N/A;   ELECTROPHYSIOLOGIC STUDY N/A 08/16/2014   Procedure: CARDIOVERSION;  Surgeon: [name], MD;  Location: [facility] ORS;  Service: Cardiovascular;  Laterality: N/A;   FLEXIBLE  SIGMOIDOSCOPY N/A 10/10/2015   Procedure: FLEXIBLE SIGMOIDOSCOPY;  Surgeon: [name], MD;  Location: [facility] ENDOSCOPY;  Service: Endoscopy;  Laterality: N/A;   KNEE SURGERY Right    NM MYOVIEW LTD  November 2011   No ischemia or infarction. EF 50-55% ( no improvement from 2009 Myoview EF of 28%   TEE WITHOUT CARDIOVERSION N/A 08/16/2014   Procedure: TRANSESOPHAGEAL ECHOCARDIOGRAM (TEE);  Surgeon: [name], MD;  Location: [facility] ORS;  Service: Cardiovascular;  Laterality: N/A;   TEE WITHOUT CARDIOVERSION N/A 03/05/2021   Procedure: TRANSESOPHAGEAL ECHOCARDIOGRAM (TEE);  Surgeon: Kate Sable, MD;  Location: [facility] ORS;  Service: Cardiovascular;  Laterality: N/A;     Current Facility-Administered Medications:    acetaminophen (TYLENOL) tablet 650 mg, 650 mg, Oral, Q6H PRN **OR** acetaminophen (TYLENOL) suppository 650 mg, 650 mg, Rectal, Q6H PRN, Athena Masse, MD   carvedilol (COREG) tablet 12.5 mg, 12.5 mg, Oral, BID WC, Lorella Nimrod, MD, 12.5 mg at 04/27/22 0837   heparin ADULT infusion 100 units/mL (25000 units/271m), 1,200 Units/hr, Intravenous, Continuous, ALorella Nimrod MD, Last Rate: 12 mL/hr at 04/27/22 0516, 1,200 Units/hr at 04/27/22 0516   insulin aspart (novoLOG) injection 0-5 Units, 0-5 Units, Subcutaneous, QHS, DDamita Dunnings HWaldemar Dickens MD   insulin aspart (novoLOG) injection 0-9 Units, 0-9 Units, Subcutaneous, TID WC, DAthena Masse MD   levothyroxine (SYNTHROID) tablet 100 mcg, 100 mcg, Oral, Q0600, DAthena Masse MD, 100 mcg at 04/27/22 0515   [START ON 04/28/2022] lipase/protease/amylase (CREON) capsule 36,000 Units, 36,000 Units, Oral, TID WC, Darran Gabay, RTally Due MD   ondansetron (ZOFRAN) tablet 4 mg, 4 mg, Oral, Q6H PRN **OR** ondansetron (ZOFRAN) injection 4 mg, 4 mg, Intravenous, Q6H PRN, DAthena Masse MD   oxyCODONE (Oxy IR/ROXICODONE) immediate release tablet 5 mg, 5 mg, Oral, Q6H PRN, ALorella Nimrod MD, 5 mg at 04/27/22 0515   polyethylene glycol (MIRALAX  / GLYCOLAX) packet 34 g, 34 g, Oral, BID, Xabi Wittler, RTally Due MD   Family History  Problem Relation Age of Onset   Hypertension Mother    Hyperlipidemia Mother    Diabetes Mother      Social History   Tobacco Use   Smoking status: Former    Packs/day: 1.00    Years: 12.00    Total pack years: 12.00    Types: Cigarettes    Quit date: 02/23/1998    Years since quitting: 24.1   Smokeless tobacco: Never  Vaping Use   Vaping Use: Never used  Substance Use Topics   Alcohol use: Not Currently    Alcohol/week: 0.0 standard drinks of alcohol    Comment: Past heavy drinker   Drug use: No    Allergies as of 04/25/2022 - Review Complete 04/25/2022  Allergen Reaction Noted   Esomeprazole magnesium Other (See Comments) 01/18/2017   Eggs or egg-derived products Rash 03/28/2014    Review of Systems:    All systems reviewed and negative except where noted in HPI.   Physical Exam:  Vital signs in last 24 hours: Temp:  [97.4 F (36.3 C)-97.8 F (36.6 C)] 97.4 F (36.3 C) (03/04 1255) Pulse Rate:  [  42-109] 88 (03/04 1255) Resp:  [16-18] 18 (03/04 1255) BP: (101-118)/(79-98) 104/79 (03/04 1255) SpO2:  [93 %-100 %] 100 % (03/04 1255) Weight:  [108.3 kg] 108.3 kg (03/04 0500) Last BM Date : 04/25/22 General:   Pleasant, cooperative in NAD Head:  Normocephalic and atraumatic. Eyes:   No icterus.   Conjunctiva pink. PERRLA. Ears:  Normal auditory acuity. Neck:  Supple; no masses or thyroidomegaly Lungs: Respirations even and unlabored. Lungs clear to auscultation bilaterally.   No wheezes, crackles, or rhonchi.  Heart:  Regular rate and rhythm;  Without murmur, clicks, rubs or gallops Abdomen:  Soft, mildly distended, dull to percussion, moderate epigastric tenderness. Normal bowel sounds. No appreciable masses or hepatomegaly.  No rebound or guarding.  Rectal:  Not performed. Msk:  Symmetrical without gross deformities.  Strength normal Extremities:  Without edema, cyanosis or  clubbing. Neurologic:  Alert and oriented x3;  grossly normal neurologically. Skin:  Intact without significant lesions or rashes. Psych:  Alert and cooperative. Normal affect.  LAB RESULTS:    Latest Ref Rng & Units 04/27/2022    2:59 AM 04/26/2022    4:46 PM 04/25/2022    6:21 PM  CBC  WBC 4.0 - 10.5 K/uL 8.2  7.8  8.0   Hemoglobin 13.0 - 17.0 g/dL 11.8  11.9  12.7   Hematocrit 39.0 - 52.0 % 36.2  36.9  40.7   Platelets 150 - 400 K/uL 230  223  138     BMET    Latest Ref Rng & Units 04/27/2022    2:59 AM 04/26/2022    8:24 AM 04/26/2022    7:12 AM  BMP  Glucose 70 - 99 mg/dL 109  103    BUN 6 - 20 mg/dL 67  63    Creatinine 0.61 - 1.24 mg/dL 3.61  3.38    Sodium 135 - 145 mmol/L 132  132    Potassium 3.5 - 5.1 mmol/L 4.1  4.5  4.6   Chloride 98 - 111 mmol/L 96  99    CO2 22 - 32 mmol/L 22  18    Calcium 8.9 - 10.3 mg/dL 8.3  8.8      LFT    Latest Ref Rng & Units 04/27/2022    2:59 AM 04/26/2022    8:24 AM 04/25/2022    6:21 PM  Hepatic Function  Total Protein 6.5 - 8.1 g/dL 6.5  6.8  7.7   Albumin 3.5 - 5.0 g/dL 3.1  3.5  3.5   AST 15 - 41 U/L 23  23  33   ALT 0 - 44 U/L '18  16  13   '$ Alk Phosphatase 38 - 126 U/L 88  93  94   Total Bilirubin 0.3 - 1.2 mg/dL 5.1  5.4  4.8   Bilirubin, Direct 0.0 - 0.2 mg/dL 2.5   2.1      STUDIES: US Abdomen Limited RUQ (LIVER/GB)  Result Date: 04/26/2022 CLINICAL DATA:  Elevated bilirubin EXAM: ULTRASOUND ABDOMEN LIMITED RIGHT UPPER QUADRANT COMPARISON:  02/23/2022 CT FINDINGS: Gallbladder: Gallbladder is well distended with evidence of gallbladder sludge and cholelithiasis. Negative sonographic Murphy's sign is noted. Common bile duct: Diameter: 3.6 mm. Liver: Mild nodularity in the liver is noted suspicious for underlying cirrhosis. No focal mass is noted. Portal vein is patent on color Doppler imaging with normal direction of blood flow towards the liver. Other: None. IMPRESSION: Cholelithiasis and gallbladder sludge without complicating  factors. Findings suspicious for hepatic cirrhosis. Electronically Signed  By: Inez Catalina M.D.   On: 04/26/2022 02:10   DG Chest 2 View  Result Date: 04/25/2022 CLINICAL DATA:  SOB EXAM: CHEST - 2 VIEW COMPARISON:  Chest XR, 02/23/2022.  CT chest, 01/06/2022. FINDINGS: Cardiac silhouette is mildly enlarged. Lungs are hypoinflated. Perihilar interstitial thickening and trace fluid layering along minor fissure. No focal consolidation or mass. No pleural effusion or pneumothorax. No acute displaced fracture. IMPRESSION: Cardiomegaly and perihilar thickening, with layering fluid along the fissure. Findings suspicious for developing pulmonary edema. Electronically Signed   By: Michaelle Birks M.D.   On: 04/25/2022 18:54      Impression / Plan:   Hector [name] is a 57 y.o. 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  Labs were significant for isolated elevated total bilirubin and ultrasound of the liver revealed cirrhosis  Epigastric pain: Differentials include biliary dyskinesia or peptic ulcer disease or symptomatic cholelithiasis Imaging revealed cholelithiasis and gallbladder sludge HIDA scan from 09/11/2021 revealed abnormally low gallbladder ejection fraction equal to 6% indicative of decreased gallbladder emptying and gallbladder dysfunction Mild atrophy of the pancreas The is no evidence of biliary ductal obstruction or dilatation or choledocholithiasis, normal caliber CBD Recommend EGD for further evaluation, if EGD is negative, recommend general surgery consult to evaluate for cholecystectomy Pepcid 20 mg p.o. twice daily Recommend to start Creon 36 K with each meal given history of pancreatic atrophy, check pancreatic fecal elastase levels   Cirrhosis of liver: Well compensated Likely secondary to history of  alcohol use and cardiomyopathy Viral hepatitis panel negative in the past No evidence of portal hypertension, normal spleen and normal platelets, directional blood flow of the portal vein based on the ultrasound EGD as above will also screen for esophageal varices and, portal hypertensive gastropathy HCC: No evidence of liver lesions HRS: Patient has stage III CKD  Isolated T. bili, alkaline phosphatase normal Mixed hyperbilirubinemia No evidence of biliary obstruction or cholestasis Amiodarone has been held Check antimitochondrial and anti-smooth muscle antibodies   Thank you for involving me in the care of this patient.      LOS: 1 day   Sherri Sear, MD  04/27/2022, 4:47 PM    Note: This dictation was prepared with Dragon dictation along with smaller phrase technology. Any transcriptional errors that result from this process are unintentional.

## 2022-04-27 NOTE — H&P (View-Only) (Signed)
Rounding Note    Patient Name: Hector [name] Date of Encounter: 04/27/2022  [facility] Cardiologist: Ida Rogue, MD   Subjective   Patient lethargic on exam. He denies chest pain or SOB. He reports abdominal pain.   Inpatient Medications    Scheduled Meds:  carvedilol  12.5 mg Oral BID WC   ferrous gluconate  324 mg Oral Q breakfast   insulin aspart  0-5 Units Subcutaneous QHS   insulin aspart  0-9 Units Subcutaneous TID WC   levothyroxine  100 mcg Oral Q0600   Continuous Infusions:  heparin 1,200 Units/hr (04/27/22 0516)   PRN Meds: acetaminophen **OR** acetaminophen, ondansetron **OR** ondansetron (ZOFRAN) IV, oxyCODONE   Vital Signs    Vitals:   04/27/22 0500 04/27/22 0532 04/27/22 0752 04/27/22 1255  BP:  102/87 112/88 104/79  Pulse:  (!) 49 (!) 109 88  Resp:  '18 18 18  '$ Temp:  97.6 F (36.4 C) 97.7 F (36.5 C) (!) 97.4 F (36.3 C)  TempSrc:      SpO2:  99% 94% 100%  Weight: 108.3 kg     Height:        Intake/Output Summary (Last 24 hours) at 04/27/2022 1328 Last data filed at 04/27/2022 0752 Gross per 24 hour  Intake 650.34 ml  Output 2075 ml  Net -1424.66 ml      04/27/2022    5:00 AM 04/26/2022    7:21 AM 04/25/2022    6:16 PM  Last 3 Weights  Weight (lbs) 238 lb 10.4 oz 243 lb 8 oz 245 lb  Weight (kg) 108.25 kg 110.451 kg 111.131 kg      Telemetry    Afib PVC, HR 80-90s - Personally Reviewed  ECG    No new - Personally Reviewed  Physical Exam   GEN: No acute distress.   Neck: + JVD Cardiac: Irreg Irreg, no murmurs, rubs, or gallops.  Respiratory: Clear to auscultation bilaterally. GI: Soft, nontender, non-distended  MS: trace lower leg edema; No deformity. Neuro:  Nonfocal  Psych: Normal affect   Labs    High Sensitivity Troponin:   Recent Labs  Lab 04/25/22 1821 04/25/22 2045  TROPONINIHS 43* 48*     Chemistry Recent Labs  Lab 04/25/22 1821 04/25/22 2328 04/26/22 0712 04/26/22 0824 04/27/22 0259  NA  132*  --   --  132* 132*  K 6.2*   < > 4.6 4.5 4.1  CL 97*  --   --  99 96*  CO2 25  --   --  18* 22  GLUCOSE 123*  --   --  103* 109*  BUN 60*  --   --  63* 67*  CREATININE 3.15*  --   --  3.38* 3.61*  CALCIUM 8.6*  --   --  8.8* 8.3*  MG 2.5*  --   --   --   --   PROT 7.7  --   --  6.8 6.5  ALBUMIN 3.5  --   --  3.5 3.1*  AST 33  --   --  23 23  ALT 13  --   --  16 18  ALKPHOS 94  --   --  93 88  BILITOT 4.8*  --   --  5.4* 5.1*  GFRNONAA 22*  --   --  20* 19*  ANIONGAP 10  --   --  15 14   < > = values in this interval not displayed.  Lipids No results for input(s): "CHOL", "TRIG", "HDL", "LABVLDL", "LDLCALC", "CHOLHDL" in the last 168 hours.  Hematology Recent Labs  Lab 04/25/22 1821 04/26/22 1646 04/27/22 0259  WBC 8.0 7.8 8.2  RBC 4.67 4.40 4.30  HGB 12.7* 11.9* 11.8*  HCT 40.7 36.9* 36.2*  MCV 87.2 83.9 84.2  MCH 27.2 27.0 27.4  MCHC 31.2 32.2 32.6  RDW 20.6* 20.6* 20.7*  PLT 138* 223 230   Thyroid No results for input(s): "TSH", "FREET4" in the last 168 hours.  BNP Recent Labs  Lab 04/26/22 1646  BNP 2,109.1*    DDimer No results for input(s): "DDIMER" in the last 168 hours.   Radiology    US Abdomen Limited RUQ (LIVER/GB)  Result Date: 04/26/2022 CLINICAL DATA:  Elevated bilirubin EXAM: ULTRASOUND ABDOMEN LIMITED RIGHT UPPER QUADRANT COMPARISON:  02/23/2022 CT FINDINGS: Gallbladder: Gallbladder is well distended with evidence of gallbladder sludge and cholelithiasis. Negative sonographic Murphy's sign is noted. Common bile duct: Diameter: 3.6 mm. Liver: Mild nodularity in the liver is noted suspicious for underlying cirrhosis. No focal mass is noted. Portal vein is patent on color Doppler imaging with normal direction of blood flow towards the liver. Other: None. IMPRESSION: Cholelithiasis and gallbladder sludge without complicating factors. Findings suspicious for hepatic cirrhosis. Electronically Signed   By: Inez Catalina M.D.   On: 04/26/2022 02:10   DG  Chest 2 View  Result Date: 04/25/2022 CLINICAL DATA:  SOB EXAM: CHEST - 2 VIEW COMPARISON:  Chest XR, 02/23/2022.  CT chest, 01/06/2022. FINDINGS: Cardiac silhouette is mildly enlarged. Lungs are hypoinflated. Perihilar interstitial thickening and trace fluid layering along minor fissure. No focal consolidation or mass. No pleural effusion or pneumothorax. No acute displaced fracture. IMPRESSION: Cardiomegaly and perihilar thickening, with layering fluid along the fissure. Findings suspicious for developing pulmonary edema. Electronically Signed   By: Michaelle Birks M.D.   On: 04/25/2022 18:54    Cardiac Studies   Limited echo 11/2021 1. Left ventricular ejection fraction, by estimation, is <20%. The left  ventricle has severely decreased function. The left ventricle has no  regional wall motion abnormalities. The left ventricular internal cavity  size was mildly to moderately dilated.   2. Right ventricular systolic function is normal. The right ventricular  size is normal. There is mildly elevated pulmonary artery systolic  pressure.   3. Left atrial size was severely dilated.   4. Right atrial size was mildly dilated.   5. The mitral valve is normal in structure. Moderate to severe mitral  valve regurgitation. No evidence of mitral stenosis.   6. The aortic valve is normal in structure. Aortic valve regurgitation is  not visualized. No aortic stenosis is present.   7. Moderately dilated pulmonary artery.   8. The inferior vena cava is dilated in size with <50% respiratory  variability, suggesting right atrial pressure of 15 mmHg.   Echo 09/2021 1. Left ventricular ejection fraction, by estimation, is <20%. The left  ventricle has severely decreased function. The left ventricle has no  regional wall motion abnormalities. The left ventricular internal cavity  size was moderately dilated. Left  ventricular diastolic parameters are indeterminate.   2. Right ventricular systolic function is  mildly reduced. The right  ventricular size is normal. Tricuspid regurgitation signal is inadequate  for assessing PA pressure.   3. Left atrial size was moderately dilated.   4. Right atrial size was mildly dilated.   5. The mitral valve is normal in structure. Moderate mitral valve  regurgitation. No  evidence of mitral stenosis.   6. The aortic valve is normal in structure. Aortic valve regurgitation is  not visualized. Aortic valve sclerosis/calcification is present, without  any evidence of aortic stenosis.   7. The inferior vena cava is dilated in size with <50% respiratory  variability, suggesting right atrial pressure of 15 mmHg.   Patient Profile     57 y.o. male with a h/o NICM, CAD, CKD stage 4, PAF on Eliquis, HL, DM2, ETOH use, stroke, hypothyroidism, GIB, OSA, anemia who is being seen for CHF.   Assessment & Plan    HFrEF NICM LVEF less than 20% -multiple admission over the last 3 months for CHF - presented with SOB and abdominal distention and vomiting x 2. - BNP 2,109 - PTA Invokana '100mg'$  daily, metolazone 2.'5mg'$  daily and Torsemide '60mg'$  daily>held - IV lasix '40mg'$  BID>held 3/3 for worsening kidney function - continue Coreg 12.'5mg'$  BID - Net -1.4L - no ACE/ARB/ARNI/MRA due to CKD - Echo 11/2021 showed LVEF <20% - patient is not ICD candidate due to noncompliance -  follows with Advanced heart failure team - On exam does not appear severely volume overloaded - IV lasix held for worsening kidney function, may need to get nephrology input. Degree of low output heart failure, may need milrinone.  RUQ discomfort - bili elevated - lipitor and amiodarone stopped - GI consulted  Afib - he has had cardioversion in the past - Coreg for rate control - plan was for amio load with DCCV, however this was canceled due to admission - amiodarone held - Eliquis stopped and started on IV heparin for possible procedure  Elevated troponin CAD - suspect supply demand mismatch  in the setting of poor LV function and ongoing medical problems - minimal nonobstructive CAD by cath in 2016 - statin held as above  CKD stage 4 - Scr 3.5 on admission - Baseline 2.8-3.4  Hyperkalemia - K 6.2 on admission s/p calcium gluconate and [place] - resolved  For questions or updates, please contact [place] Please consult [email or web address] for contact info under        Signed, Samin Milke Ninfa Meeker, PA-C  04/27/2022, 1:28 PM

## 2022-04-27 NOTE — Progress Notes (Signed)
Rounding Note    Patient Name: Hector [name] Date of Encounter: 04/27/2022  [place] Cardiologist: Ida Rogue, MD   Subjective   Patient lethargic on exam. He denies chest pain or SOB. He reports abdominal pain.   Inpatient Medications    Scheduled Meds:  carvedilol  12.5 mg Oral BID WC   ferrous gluconate  324 mg Oral Q breakfast   insulin aspart  0-5 Units Subcutaneous QHS   insulin aspart  0-9 Units Subcutaneous TID WC   levothyroxine  100 mcg Oral Q0600   Continuous Infusions:  heparin 1,200 Units/hr (04/27/22 0516)   PRN Meds: acetaminophen **OR** acetaminophen, ondansetron **OR** ondansetron (ZOFRAN) IV, oxyCODONE   Vital Signs    Vitals:   04/27/22 0500 04/27/22 0532 04/27/22 0752 04/27/22 1255  BP:  102/87 112/88 104/79  Pulse:  (!) 49 (!) 109 88  Resp:  '18 18 18  '$ Temp:  97.6 F (36.4 C) 97.7 F (36.5 C) (!) 97.4 F (36.3 C)  TempSrc:      SpO2:  99% 94% 100%  Weight: 108.3 kg     Height:        Intake/Output Summary (Last 24 hours) at 04/27/2022 1328 Last data filed at 04/27/2022 0752 Gross per 24 hour  Intake 650.34 ml  Output 2075 ml  Net -1424.66 ml      04/27/2022    5:00 AM 04/26/2022    7:21 AM 04/25/2022    6:16 PM  Last 3 Weights  Weight (lbs) 238 lb 10.4 oz 243 lb 8 oz 245 lb  Weight (kg) 108.25 kg 110.451 kg 111.131 kg      Telemetry    Afib PVC, HR 80-90s - Personally Reviewed  ECG    No new - Personally Reviewed  Physical Exam   GEN: No acute distress.   Neck: + JVD Cardiac: Irreg Irreg, no murmurs, rubs, or gallops.  Respiratory: Clear to auscultation bilaterally. GI: Soft, nontender, non-distended  MS: trace lower leg edema; No deformity. Neuro:  Nonfocal  Psych: Normal affect   Labs    High Sensitivity Troponin:   Recent Labs  Lab 04/25/22 1821 04/25/22 2045  TROPONINIHS 43* 48*     Chemistry Recent Labs  Lab 04/25/22 1821 04/25/22 2328 04/26/22 0712 04/26/22 0824 04/27/22 0259  NA  132*  --   --  132* 132*  K 6.2*   < > 4.6 4.5 4.1  CL 97*  --   --  99 96*  CO2 25  --   --  18* 22  GLUCOSE 123*  --   --  103* 109*  BUN 60*  --   --  63* 67*  CREATININE 3.15*  --   --  3.38* 3.61*  CALCIUM 8.6*  --   --  8.8* 8.3*  MG 2.5*  --   --   --   --   PROT 7.7  --   --  6.8 6.5  ALBUMIN 3.5  --   --  3.5 3.1*  AST 33  --   --  23 23  ALT 13  --   --  16 18  ALKPHOS 94  --   --  93 88  BILITOT 4.8*  --   --  5.4* 5.1*  GFRNONAA 22*  --   --  20* 19*  ANIONGAP 10  --   --  15 14   < > = values in this interval not displayed.  Lipids No results for input(s): "CHOL", "TRIG", "HDL", "LABVLDL", "LDLCALC", "CHOLHDL" in the last 168 hours.  Hematology Recent Labs  Lab 04/25/22 1821 04/26/22 1646 04/27/22 0259  WBC 8.0 7.8 8.2  RBC 4.67 4.40 4.30  HGB 12.7* 11.9* 11.8*  HCT 40.7 36.9* 36.2*  MCV 87.2 83.9 84.2  MCH 27.2 27.0 27.4  MCHC 31.2 32.2 32.6  RDW 20.6* 20.6* 20.7*  PLT 138* 223 230   Thyroid No results for input(s): "TSH", "FREET4" in the last 168 hours.  BNP Recent Labs  Lab 04/26/22 1646  BNP 2,109.1*    DDimer No results for input(s): "DDIMER" in the last 168 hours.   Radiology    US Abdomen Limited RUQ (LIVER/GB)  Result Date: 04/26/2022 CLINICAL DATA:  Elevated bilirubin EXAM: ULTRASOUND ABDOMEN LIMITED RIGHT UPPER QUADRANT COMPARISON:  02/23/2022 CT FINDINGS: Gallbladder: Gallbladder is well distended with evidence of gallbladder sludge and cholelithiasis. Negative sonographic Murphy's sign is noted. Common bile duct: Diameter: 3.6 mm. Liver: Mild nodularity in the liver is noted suspicious for underlying cirrhosis. No focal mass is noted. Portal vein is patent on color Doppler imaging with normal direction of blood flow towards the liver. Other: None. IMPRESSION: Cholelithiasis and gallbladder sludge without complicating factors. Findings suspicious for hepatic cirrhosis. Electronically Signed   By: Inez Catalina M.D.   On: 04/26/2022 02:10   DG  Chest 2 View  Result Date: 04/25/2022 CLINICAL DATA:  SOB EXAM: CHEST - 2 VIEW COMPARISON:  Chest XR, 02/23/2022.  CT chest, 01/06/2022. FINDINGS: Cardiac silhouette is mildly enlarged. Lungs are hypoinflated. Perihilar interstitial thickening and trace fluid layering along minor fissure. No focal consolidation or mass. No pleural effusion or pneumothorax. No acute displaced fracture. IMPRESSION: Cardiomegaly and perihilar thickening, with layering fluid along the fissure. Findings suspicious for developing pulmonary edema. Electronically Signed   By: Michaelle Birks M.D.   On: 04/25/2022 18:54    Cardiac Studies   Limited echo 11/2021 1. Left ventricular ejection fraction, by estimation, is <20%. The left  ventricle has severely decreased function. The left ventricle has no  regional wall motion abnormalities. The left ventricular internal cavity  size was mildly to moderately dilated.   2. Right ventricular systolic function is normal. The right ventricular  size is normal. There is mildly elevated pulmonary artery systolic  pressure.   3. Left atrial size was severely dilated.   4. Right atrial size was mildly dilated.   5. The mitral valve is normal in structure. Moderate to severe mitral  valve regurgitation. No evidence of mitral stenosis.   6. The aortic valve is normal in structure. Aortic valve regurgitation is  not visualized. No aortic stenosis is present.   7. Moderately dilated pulmonary artery.   8. The inferior vena cava is dilated in size with <50% respiratory  variability, suggesting right atrial pressure of 15 mmHg.   Echo 09/2021 1. Left ventricular ejection fraction, by estimation, is <20%. The left  ventricle has severely decreased function. The left ventricle has no  regional wall motion abnormalities. The left ventricular internal cavity  size was moderately dilated. Left  ventricular diastolic parameters are indeterminate.   2. Right ventricular systolic function is  mildly reduced. The right  ventricular size is normal. Tricuspid regurgitation signal is inadequate  for assessing PA pressure.   3. Left atrial size was moderately dilated.   4. Right atrial size was mildly dilated.   5. The mitral valve is normal in structure. Moderate mitral valve  regurgitation. No  evidence of mitral stenosis.   6. The aortic valve is normal in structure. Aortic valve regurgitation is  not visualized. Aortic valve sclerosis/calcification is present, without  any evidence of aortic stenosis.   7. The inferior vena cava is dilated in size with <50% respiratory  variability, suggesting right atrial pressure of 15 mmHg.   Patient Profile     57 y.o. male with a h/o NICM, CAD, CKD stage 4, PAF on Eliquis, HL, DM2, ETOH use, stroke, hypothyroidism, GIB, OSA, anemia who is being seen for CHF.   Assessment & Plan    HFrEF NICM LVEF less than 20% -multiple admission over the last 3 months for CHF - presented with SOB and abdominal distention and vomiting x 2. - BNP 2,109 - PTA Invokana '100mg'$  daily, metolazone 2.'5mg'$  daily and Torsemide '60mg'$  daily>held - IV lasix '40mg'$  BID>held 3/3 for worsening kidney function - continue Coreg 12.'5mg'$  BID - Net -1.4L - no ACE/ARB/ARNI/MRA due to CKD - Echo 11/2021 showed LVEF <20% - patient is not ICD candidate due to noncompliance -  follows with Advanced heart failure team - On exam does not appear severely volume overloaded - IV lasix held for worsening kidney function, may need to get nephrology input. Degree of low output heart failure, may need milrinone.  RUQ discomfort - bili elevated - lipitor and amiodarone stopped - GI consulted  Afib - he has had cardioversion in the past - Coreg for rate control - plan was for amio load with DCCV, however this was canceled due to admission - amiodarone held - Eliquis stopped and started on IV heparin for possible procedure  Elevated troponin CAD - suspect supply demand mismatch  in the setting of poor LV function and ongoing medical problems - minimal nonobstructive CAD by cath in 2016 - statin held as above  CKD stage 4 - Scr 3.5 on admission - Baseline 2.8-3.4  Hyperkalemia - K 6.2 on admission s/p calcium gluconate and Veltassa - resolved  For questions or updates, please contact [place] Please consult [email or web address] for contact info under        Signed, Shantoya Geurts Ninfa Meeker, PA-C  04/27/2022, 1:28 PM

## 2022-04-27 NOTE — Discharge Instructions (Signed)

## 2022-04-27 NOTE — Progress Notes (Signed)
ANTICOAGULATION CONSULT NOTE - Initial Consult  Pharmacy Consult for IV heparin Indication: atrial fibrillation  Allergies  Allergen Reactions   Esomeprazole Magnesium Other (See Comments)    Suspected interstitial nephritis 2018   Eggs Or Egg-Derived Products Rash    Patient Measurements: Height: '5\' 11"'$  (180.3 cm) Weight: 108.3 kg (238 lb 10.4 oz) IBW/kg (Calculated) : 75.3 Heparin Dosing Weight: 99.2 kg  Vital Signs: Temp: 97.4 F (36.3 C) (03/04 1255) BP: 104/79 (03/04 1255) Pulse Rate: 88 (03/04 1255)  Labs: Recent Labs    04/25/22 1821 04/25/22 2045 04/26/22 0824 04/26/22 1646 04/26/22 1839 04/27/22 0259 04/27/22 1321  HGB 12.7*  --   --  11.9*  --  11.8*  --   HCT 40.7  --   --  36.9*  --  36.2*  --   PLT 138*  --   --  223  --  230  --   APTT  --   --   --  41*  --  139* 79*  LABPROT  --   --  19.8* 22.1*  --   --   --   INR  --   --  1.7* 2.0*  --   --   --   HEPARINUNFRC  --   --   --   --  >1.10* >1.10*  --   CREATININE 3.15*  --  3.38*  --   --  3.61*  --   CKTOTAL  --   --   --  291  --   --   --   TROPONINIHS 43* 48*  --   --   --   --   --     Estimated Creatinine Clearance: 28.3 mL/min (A) (by C-G formula based on SCr of 3.61 mg/dL (H)).  Medical History: Past Medical History:  Diagnosis Date   Alcohol abuse    Alcoholic cardiomyopathy ([place])    a. 12/2007 MV: EF 28%, no isch;  b. 8/12 Echo: EF 25-35%; c. 02/2014 Echo: EF 20-25%; d. 12/2014 Cath: minimal CAD; e. 01/2015 Echo: EF 50-55%;  d. 05/2016 Echo: EF 30-35%, diff HK, gr2 DD; e. 11/2016 Echo: EF 45-50%, diff HK; f. 09/2021 Echo: EF <20%; g. 11/2021 Echo: EF<20%.   Chronic combined systolic (congestive) and diastolic (congestive) heart failure ([place])    a. 05/2016 Echo: EF 30-35%; b. 11/2016 Echo: EF 45-50%, diff HK; c. 09/2021 Echo: EF < 20%; d. 11/2021 Echo: EF <20%, no rwma, Nl RV fxn, sev dil LA, mod-sev MR. mod dil PA w/ mildly elev PASP.   CKD (chronic kidney disease), stage III (HCC)     Elevated troponin (chronic)    Essential hypertension    GI bleed 11/2013   Hyperlipidemia    Pancreatitis    Paroxysmal A-fib (HCC)    a. new onset s/p unsuccessful TEE/DCCV on 08/16/2014; b. CHA2DS2VASc = 3-> eliquis (freq noncompliant); c. 02/2021 s/p TEE/DCCV; d. Prev on amio->d/c 2/2 abnl TFTs.   Paroxysmal atrial flutter (HCC)    Sleep-disordered breathing    Has yet to have a sleep study   Stroke [facility])     Medications:  Apixaban 5 mg BID PTA  Assessment: 57 year old male being admitted with acute on chronic systolic CHF. PMH includes Afib on Eliquis prior to admission, pancreatitis, sCHF with EF < 20%, CKD-IV, HTN, HLD, stroke, hypothyroidism, alcohol abuse in remission, GI bleeding, OSA, anemia, hospitalized 2 months ago with RSV.  Pharmacy has been consulted to start IV heparin  due to possible procedure H&H stable.   Last dose Eliquis 3/3 AM '@0905'$  (> 8 hours since last dose)  Goal of Therapy:  aPTT 66-102 seconds Monitor platelets by anticoagulation protocol: Yes   Results: 3/4 @ 0259:  aPTT = 139,  HL = > 1.10 3/4 '@1321'$  aPTT = 79, Therapeutic x 1, heparin rate 1200 units/hr  Plan:  Continue heparin infusion at @ 1200 units/hr. Recheck aPTT in 8 hrs to confirm therapeutic rate Use aPTT to dose heparin until correlating with HL. Recheck HL and aPTT on 3/5 with AM labs.  Continue to monitor daily H&H and platelets while infusion  Jamice Carreno Rodriguez-Guzman PharmD, BCPS 04/27/2022 2:33 PM

## 2022-04-27 NOTE — Progress Notes (Signed)
ANTICOAGULATION CONSULT NOTE - Initial Consult  Pharmacy Consult for IV heparin Indication: atrial fibrillation  Allergies  Allergen Reactions   Esomeprazole Magnesium Other (See Comments)    Suspected interstitial nephritis 2018   Eggs Or Egg-Derived Products Rash    Patient Measurements: Height: '5\' 11"'$  (180.3 cm) Weight: 110.5 kg (243 lb 8 oz) IBW/kg (Calculated) : 75.3 Heparin Dosing Weight: 99.2 kg  Vital Signs: Temp: 97.7 F (36.5 C) (03/03 2311) Temp Source: Oral (03/03 1929) BP: 101/88 (03/03 2311) Pulse Rate: 42 (03/03 2311)  Labs: Recent Labs    04/25/22 1821 04/25/22 2045 04/26/22 0824 04/26/22 1646 04/26/22 1839 04/27/22 0259  HGB 12.7*  --   --  11.9*  --  11.8*  HCT 40.7  --   --  36.9*  --  36.2*  PLT 138*  --   --  223  --  230  APTT  --   --   --  41*  --  139*  LABPROT  --   --  19.8* 22.1*  --   --   INR  --   --  1.7* 2.0*  --   --   HEPARINUNFRC  --   --   --   --  >1.10* >1.10*  CREATININE 3.15*  --  3.38*  --   --  3.61*  CKTOTAL  --   --   --  291  --   --   TROPONINIHS 43* 48*  --   --   --   --      Estimated Creatinine Clearance: 28.5 mL/min (A) (by C-G formula based on SCr of 3.61 mg/dL (H)).   Medical History: Past Medical History:  Diagnosis Date   Alcohol abuse    Alcoholic cardiomyopathy ([place])    a. 12/2007 MV: EF 28%, no isch;  b. 8/12 Echo: EF 25-35%; c. 02/2014 Echo: EF 20-25%; d. 12/2014 Cath: minimal CAD; e. 01/2015 Echo: EF 50-55%;  d. 05/2016 Echo: EF 30-35%, diff HK, gr2 DD; e. 11/2016 Echo: EF 45-50%, diff HK; f. 09/2021 Echo: EF <20%; g. 11/2021 Echo: EF<20%.   Chronic combined systolic (congestive) and diastolic (congestive) heart failure ([place])    a. 05/2016 Echo: EF 30-35%; b. 11/2016 Echo: EF 45-50%, diff HK; c. 09/2021 Echo: EF < 20%; d. 11/2021 Echo: EF <20%, no rwma, Nl RV fxn, sev dil LA, mod-sev MR. mod dil PA w/ mildly elev PASP.   CKD (chronic kidney disease), stage III (HCC)    Elevated troponin (chronic)     Essential hypertension    GI bleed 11/2013   Hyperlipidemia    Pancreatitis    Paroxysmal A-fib (HCC)    a. new onset s/p unsuccessful TEE/DCCV on 08/16/2014; b. CHA2DS2VASc = 3-> eliquis (freq noncompliant); c. 02/2021 s/p TEE/DCCV; d. Prev on amio->d/c 2/2 abnl TFTs.   Paroxysmal atrial flutter (HCC)    Sleep-disordered breathing    Has yet to have a sleep study   Stroke [facility])     Medications:  Apixaban 5 mg BID PTA  Assessment: 57 year old male being admitted with acute on chronic systolic CHF. PMH includes Afib on Eliquis prior to admission, pancreatitis, sCHF with EF < 20%, CKD-IV, HTN, HLD, stroke, hypothyroidism, alcohol abuse in remission, GI bleeding, OSA, anemia, hospitalized 2 months ago with RSV. Pharmacy has been consulted to start IV heparin due to upcoming right heart cath.   H&H stable.   Last dose Eliquis 3/3 AM '@0905'$  (> 8 hours since last dose)  Goal of Therapy:  aPTT 66-102 seconds Monitor platelets by anticoagulation protocol: Yes   Plan:  3/4 @ 0259:  aPTT = 139,  HL = > 1.10 - Will hold heparin drip for 1 hr and restart heparin @ 1200 units/hr. - Will recheck aPTT 8 hrs after restart.  - Will use aPTT to dose heparin until correlating with HL. - Recheck HL on 3/5 with AM labs.  - CBC daily  Continue to monitor H&H and platelets Hermela Hardt D Clinical Pharmacist  04/27/2022 4:15 AM

## 2022-04-27 NOTE — Progress Notes (Signed)
Progress Note   Patient: Hector [name] [id number] DOB: 09/17/1965 DOA: 04/25/2022     1 DOS: the patient was seen and examined on 04/27/2022   Brief hospital course: Taken from H&P.   Hector [name] is a 57 y.o. male with medical history significant for A. fib on Eliquis, pancreatitis, sCHF with EF < 20%, CKD-IV, HTN, HLD, stroke, hypothyroidism, alcohol abuse in remission, GI bleeding, OSA, anemia, hospitalized 2 months ago with RSV who presents to the ED with cough and shortness of breath for the past 2 days as well as abdominal distention.  ED course and data review: BP 141/126 with heart rate in the low 100s.  Respirations 25-26 with O2 sat on room mid to high 90s.  Labs notable for troponin of 48, BNP pending.  CBC with normal BBC count platelets 1 38,000., respiratory viral panel negative.  Creatinine near baseline at 3.5, potassium 6.2, sodium 132.  Hepatic function panel with total bilirubin 4.8. EKG,.Reviewed and interpreted showing sinus at 106 with nonspecific ST-T wave changes chest x-ray shows.Findings suspicious for developing pulmonary edema. Patient treated with IV Lasix for CHF, given a dose of calcium gluconate and Veltassa for hyperkalemia.  3/3: Vitals stable.  Hyperkalemia has been resolved with potassium of 4.6, Veltassa discontinued.  Patient was on potassium supplement at home which should be discontinued. BNP elevated at 736, patient also has CKD stage IV.  Troponin mildly positive with a flat curve.  RUQ ultrasound with cholelithiasis and gallbladder sludge without complicating factors.  Findings also suspicious for hepatic cirrhosis.  Patient quit drinking 13 years ago. Worsening T. bili and INR.  Concern of cardiohepatic and cardiorenal. Clinically appears euvolemic. Cardiology and nephrology were consulted.  Addendum.  Cardiology is advising holding amiodarone, increasing the dose of carvedilol and switching Eliquis with heparin.  Patient might need right  heart cath to see the pressures.  They were also recommending involving GI as his liver abnormalities does not really match with hepatic congestion.  Message sent to Dr.Vanga.  3/4: Hemodynamically stable.  Worsening renal function, nephrology is advising holding diuresis.  Clinically appears euvolemic but significant worsening of BNP in 1 day.  CK has been normalized.  T. bili seems stable with slight worsening of INR to 2. Patient might need milrinone for concern of low output heart failure, might need to go under advanced heart failure team, cardiology to decide regarding transfer.  Patient is very high risk for deterioration and mortality based on advance heart failure, worsening renal function and other underlying comorbidities.  Assessment and Plan: * Acute on chronic systolic CHF (congestive heart failure) (HCC) Nonischemic cardiomyopathy, EF less than 20% not previously a candidate for ICD due to noncompliance Patient presents with shortness of breath, abdominal distention, with chest x-ray suspicious for developing pulmonary edema Troponin elevated to 48 but patient denies chest pain and EKG nonacute.  ACS not suspected Suspect medication noncompliance based on fill history for medication Holding Lasix and metolazone with worsening renal function. Continue carvedilol  Daily weights with intake and output monitoring Last echo October 2023 showed EF less than 20% Patient was previously deemed not to be ICD insertion by EP due to noncompliance  Patient was last seen by his cardiologist Dr. Haroldine Laws in December 2023 at which time metolazone was added to his diuretic regimen, weight was 247 pounds at the time Reds vest Cardiology was consulted-might need milrinone for low output heart failure  Nonischemic cardiomyopathy: EF 20-25% Please see above  Hyperkalemia Potassium was  6.2 on admission which has been improved. Received calcium gluconate and Veltassa in the ED Patient was on  potassium supplement at home which need to be discontinued. Worsening renal function can be contributory. -Discontinuing Veltassa and keep monitoring  Atrial fibrillation with RVR (HCC) Heart rate in low 100.  Has had DC cardioversion in the past Continue amiodarone and carvedilol Continue apixaban for primary stroke prevention  Hyponatremia Clinically appears euvolemic with mildly elevated BNP based on renal function. Can be due to diuretic use.  Mild hyponatremia with sodium at 132 -Continue to monitor  Chronic renal impairment, stage 4 (severe) (HCC) AKI with history of CKD stage IV.  Creatinine at 3.61 today with baseline around 3 -Holding diuretic -Concern of cardiorenal -Nephrology consult  Hypothyroidism Continue levothyroxine  Type II diabetes mellitus with renal manifestations ([place]) Hold Invokana due to worsening renal function Sliding scale insulin coverage  Hyperbilirubinemia Bilirubin chronically elevated around 2.5 but now 4.8>>5.4 . INR 1.7 suspect related to hepatic congestion from CHF Had a HIDA scan July 2023 for bilirubin around 3 that showed "Abnormally low gallbladder ejection fraction equal to 6% indicative of decreased gallbladder emptying and gallbladder dysfunction" Renal ultrasound with concern of cirrhosis.  Thrombocytopenia (HCC) Mild thrombocytopenia which seems chronic.  -Continue to monitor  Alcohol use, unspecified, in remission Patient does not currently drink, per patient he quit drinking 13 years ago  History of GI bleed No acute concerns    Subjective: Patient continued to have right upper quadrant pain.  Able to tolerate some diet.  Physical Exam: Vitals:   04/27/22 0500 04/27/22 0532 04/27/22 0752 04/27/22 1255  BP:  102/87 112/88 104/79  Pulse:  (!) 49 (!) 109 88  Resp:  '18 18 18  '$ Temp:  97.6 F (36.4 C) 97.7 F (36.5 C) (!) 97.4 F (36.3 C)  TempSrc:      SpO2:  99% 94% 100%  Weight: 108.3 kg     Height:        General.  Ill-appearing gentleman, in no acute distress. Pulmonary.  Lungs clear bilaterally, normal respiratory effort. CV.  Regular rate and rhythm, no JVD, rub or murmur. Abdomen.  Soft, mild RUQ and epigastric tenderness, nondistended, BS positive. CNS.  Alert and oriented .  No focal neurologic deficit. Extremities.  No edema, no cyanosis, pulses intact and symmetrical. Psychiatry.  Judgment and insight appears normal. .   Data Reviewed: Prior data reviewed  Family Communication: Discussed with patient  Disposition: Status is: Inpatient Remains inpatient appropriate because: Severity of illness  Planned Discharge Destination: Home  DVT prophylaxis.  Heparin Time spent: 50 minutes  This record has been created using Systems analyst. Errors have been sought and corrected,but may not always be located. Such creation errors do not reflect on the standard of care.   Author: Lorella Nimrod, MD 04/27/2022 2:48 PM  For on call review [email or web address].

## 2022-04-27 NOTE — TOC Initial Note (Signed)
Transition of Care [facility]) - Initial/Assessment Note    Patient Details  Name: Hector [name] MRN: [id number] Date of Birth: Feb 01, 1966  Transition of Care [facility]) CM/SW Contact:    Candie Chroman, LCSW Phone Number: 04/27/2022, 11:50 AM  Clinical Narrative:  Readmission prevention screen complete. CSW met with patient. No supports at bedside. CSW introduced role and explained that discharge planning would be discussed. PCP is Rutherford Guys, MD. Brother transports him to appointments. He uses [facility] or [facility] on [address] if it is the weekend. No issues obtaining medications. Patient lives home with his brother. No home health or DME use prior to admission. He does follow up with [facility]. No further concerns. CSW encouraged patient to contact CSW as needed. CSW will continue to follow patient for support and facilitate return home once stable. Brother or brother's girlfriend will transport him home at discharge.  Expected Discharge Plan: Home/Self Care Barriers to Discharge: Continued Medical Work up   Patient Goals and CMS Choice            Expected Discharge Plan and Services     Post Acute Care Choice: NA Living arrangements for the past 2 months: Single Family Home                                      Prior Living Arrangements/Services Living arrangements for the past 2 months: Single Family Home Lives with:: Siblings Patient language and need for interpreter reviewed:: Yes Do you feel safe going back to the place where you live?: Yes      Need for Family Participation in Patient Care: Yes (Comment) Care giver support system in place?: Yes (comment)   Criminal Activity/Legal Involvement Pertinent to Current Situation/Hospitalization: No - Comment as needed  Activities of Daily Living      Permission Sought/Granted                  Emotional Assessment Appearance:: Appears stated age Attitude/Demeanor/Rapport:  Engaged Affect (typically observed): Accepting, Appropriate, Calm Orientation: : Oriented to Self, Oriented to Place, Oriented to  Time, Oriented to Situation Alcohol / Substance Use: Not Applicable Psych Involvement: No (comment)  Admission diagnosis:  Hyperkalemia [E87.5] CHF exacerbation (HCC) [I50.9] Stage 4 chronic kidney disease ([place]) [N18.4] Acute on chronic congestive heart failure, unspecified heart failure type [facility]) [I50.9] Patient Active Problem List   Diagnosis Date Noted   CHF exacerbation ([place]) 04/26/2022   Hyperkalemia 04/25/2022   Hyperbilirubinemia 04/25/2022   Thrombocytopenia ([place]) 04/25/2022   History of GI bleed 04/25/2022   Acute renal failure superimposed on stage 4 chronic kidney disease ([place]) 02/23/2022   Hyponatremia 02/23/2022   Abdominal pain 02/23/2022   Acute renal failure superimposed on stage 4 chronic kidney disease, unspecified acute renal failure type ([place]) 02/23/2022   Type II diabetes mellitus with renal manifestations ([place]) 02/23/2022   RSV infection 02/23/2022   HLD (hyperlipidemia) 01/24/2022   Myocardial injury 01/24/2022   Acute on chronic combined systolic (congestive) and diastolic (congestive) heart failure ([place]) 01/06/2022   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) 12/10/2021   Pulmonary nodule 12/07/2021   Acute on chronic congestive heart failure ([place]) 11/18/2021   Gout 11/18/2021   Atrial fibrillation with RVR ([place]) 10/13/2021   Obesity (BMI 30-39.9) 10/01/2021   Acute on chronic systolic (congestive) heart failure ([place]) 09/28/2021   Right upper quadrant pain 09/10/2021   Nausea  and vomiting 09/10/2021   Atrial flutter with rapid ventricular response ([place]) 03/01/2021   Hypothyroidism 11/30/2020   Stroke ([place])    Alcohol use, unspecified, in remission    Chronic renal impairment, stage 4 (severe) (HCC)    Chronic systolic CHF (congestive heart failure) ([place])    Iron deficiency anemia    CKD (chronic kidney disease) stage 4, GFR  15-29 ml/min ([place]) 07/21/2020   UTI (urinary tract infection) 01/30/2020   Acute renal failure superimposed on stage 3b chronic kidney disease ([place]) 12/31/2019   Acute respiratory failure with hypoxia ([place]) 12/22/2019    Class: Acute   Acute on chronic systolic CHF (congestive heart failure) ([place]) 07/21/2016   Obstructive sleep apnea 04/21/2016   Nonischemic cardiomyopathy ([place]) 05/26/2015   Chronic combined systolic (congestive) and diastolic (congestive) heart failure ([place]) 04/01/2015   Essential hypertension 04/01/2015   Nonischemic cardiomyopathy: EF 20-25% 09/21/2014   Paroxysmal atrial fibrillation ([place]) 08/16/2014   Paroxysmal atrial flutter ([place]) 08/14/2014    Class: Acute   Hypertensive heart disease    Mixed hyperlipidemia    PCP:  [name], MD Pharmacy:   [facility] [address] [place] [address] [place]) [place] [address] Phone: [phone] Fax: [place] [place] [address] Phone: [phone] Fax: [phone]     Social Determinants of Health (SDOH) Social History: SDOH Screenings   Food Insecurity: No Food Insecurity (02/25/2022)  Housing: Low Risk  (02/25/2022)  Transportation Needs: No Transportation Needs (02/25/2022)  Utilities: Not At Risk (02/25/2022)  Depression (PHQ2-9): Low Risk  (08/19/2021)  Financial Resource Strain: Low Risk  (01/04/2019)  Physical Activity: Inactive (01/04/2019)  Social Connections: Moderately Isolated (01/04/2019)  Stress: No Stress Concern Present (01/04/2019)  Tobacco Use: Medium Risk (04/25/2022)   SDOH Interventions:     Readmission Risk Interventions    04/27/2022   11:48 AM 02/24/2022    3:53 PM 12/09/2021    1:09 PM  Readmission Risk Prevention Plan  Transportation Screening Complete Complete Complete  Medication Review Press photographer) Complete Complete  Complete  PCP or Specialist appointment within 3-5 days of discharge Complete  Complete  SW Recovery Care/Counseling Consult Complete Complete Complete  Palliative Care Screening Not Applicable Not Applicable Not [place] Not Applicable Not Applicable Not Applicable

## 2022-04-27 NOTE — Assessment & Plan Note (Addendum)
Nonischemic cardiomyopathy, EF less than 20% not previously a candidate for ICD due to noncompliance Patient presents with shortness of breath, abdominal distention, with chest x-ray suspicious for developing pulmonary edema Troponin elevated to 48 but patient denies chest pain and EKG nonacute.  ACS not suspected Suspect medication noncompliance based on fill history for medication Holding Lasix and metolazone with worsening renal function. Continue carvedilol  Daily weights with intake and output monitoring Last echo October 2023 showed EF less than 20% Patient was previously deemed not to be ICD insertion by EP due to noncompliance  Patient was last seen by his cardiologist Dr. Haroldine Laws in December 2023 at which time metolazone was added to his diuretic regimen, weight was 247 pounds at the time Right heart catheterization on 3/5 with low output heart failure and severely elevated pressures.  Patient was transferred to ICU and started on milrinone with IV diuresis.  Milrinone was discontinued on 3/8.  IV diuresis was held due to low CVP.  Cardiology to decide about starting on torsemide -Daily BMP and weight -Strict intake and output -Beta-blocker was held due to low output heart failure

## 2022-04-28 ENCOUNTER — Encounter: Payer: Self-pay | Admitting: Internal Medicine

## 2022-04-28 ENCOUNTER — Inpatient Hospital Stay: Payer: Medicaid Other

## 2022-04-28 ENCOUNTER — Encounter: Payer: Self-pay | Admitting: Certified Registered"

## 2022-04-28 ENCOUNTER — Encounter
Admission: EM | Disposition: A | Discharge status: Home / Self Care | Payer: Self-pay | Source: Home / Self Care | Attending: Internal Medicine

## 2022-04-28 DIAGNOSIS — I5023 Acute on chronic systolic (congestive) heart failure: Secondary | ICD-10-CM | POA: Diagnosis not present

## 2022-04-28 DIAGNOSIS — I428 Other cardiomyopathies: Secondary | ICD-10-CM | POA: Diagnosis not present

## 2022-04-28 DIAGNOSIS — I4891 Unspecified atrial fibrillation: Secondary | ICD-10-CM | POA: Diagnosis not present

## 2022-04-28 DIAGNOSIS — E875 Hyperkalemia: Secondary | ICD-10-CM | POA: Diagnosis not present

## 2022-04-28 HISTORY — PX: CENTRAL LINE INSERTION: CATH118232

## 2022-04-28 HISTORY — PX: RIGHT HEART CATH: CATH118263

## 2022-04-28 LAB — APTT: aPTT: 123 seconds — ABNORMAL HIGH (ref 24–36)

## 2022-04-28 LAB — COMPREHENSIVE METABOLIC PANEL
ALT: 20 U/L (ref 0–44)
AST: 21 U/L (ref 15–41)
Albumin: 3.1 g/dL — ABNORMAL LOW (ref 3.5–5.0)
Alkaline Phosphatase: 93 U/L (ref 38–126)
Anion gap: 8 (ref 5–15)
BUN: 69 mg/dL — ABNORMAL HIGH (ref 6–20)
CO2: 26 mmol/L (ref 22–32)
Calcium: 8.1 mg/dL — ABNORMAL LOW (ref 8.9–10.3)
Chloride: 94 mmol/L — ABNORMAL LOW (ref 98–111)
Creatinine, Ser: 3.69 mg/dL — ABNORMAL HIGH (ref 0.61–1.24)
GFR, Estimated: 18 mL/min — ABNORMAL LOW (ref >60)
Glucose, Bld: 115 mg/dL — ABNORMAL HIGH (ref 70–99)
Potassium: 3.8 mmol/L (ref 3.5–5.1)
Sodium: 128 mmol/L — ABNORMAL LOW (ref 135–145)
Total Bilirubin: 4.6 mg/dL — ABNORMAL HIGH (ref 0.3–1.2)
Total Protein: 6.5 g/dL (ref 6.5–8.1)

## 2022-04-28 LAB — COOXEMETRY PANEL
Carboxyhemoglobin: 1.6 % — ABNORMAL HIGH (ref 0.5–1.5)
Methemoglobin: 1 % (ref 0.0–1.5)
O2 Saturation: 60.9 %
Total hemoglobin: 12.2 g/dL (ref 12.0–16.0)
Total oxygen content: 59.3 %

## 2022-04-28 LAB — MRSA NEXT GEN BY PCR, NASAL: MRSA by PCR Next Gen: DETECTED — AB

## 2022-04-28 LAB — HEPARIN LEVEL (UNFRACTIONATED): Heparin Unfractionated: 0.95 IU/mL — ABNORMAL HIGH (ref 0.30–0.70)

## 2022-04-28 LAB — VITAMIN B12: Vitamin B-12: 581 pg/mL (ref 180–914)

## 2022-04-28 SURGERY — RIGHT HEART CATH
Anesthesia: Moderate Sedation

## 2022-04-28 SURGERY — ESOPHAGOGASTRODUODENOSCOPY (EGD) WITH PROPOFOL
Anesthesia: General

## 2022-04-28 MED ORDER — FENTANYL CITRATE (PF) 100 MCG/2ML IJ SOLN
INTRAMUSCULAR | Status: AC
  Filled 2022-04-28: qty 2

## 2022-04-28 MED ORDER — FUROSEMIDE 10 MG/ML IJ SOLN
80.0000 mg | Freq: Two times a day (BID) | INTRAMUSCULAR | Status: DC
  Administered 2022-04-28 – 2022-04-30 (×4): 80 mg via INTRAVENOUS
  Filled 2022-04-28 (×4): qty 8

## 2022-04-28 MED ORDER — HEPARIN (PORCINE) 25000 UT/250ML-% IV SOLN
INTRAVENOUS | Status: AC
  Filled 2022-04-28: qty 250

## 2022-04-28 MED ORDER — MUPIROCIN 2 % EX OINT
1.0000 | TOPICAL_OINTMENT | Freq: Two times a day (BID) | CUTANEOUS | Status: AC
  Administered 2022-04-28 – 2022-05-03 (×9): 1 via NASAL
  Filled 2022-04-28 (×2): qty 22

## 2022-04-28 MED ORDER — SODIUM CHLORIDE 0.9 % IV SOLN: INTRAVENOUS | Status: DC

## 2022-04-28 MED ORDER — SODIUM CHLORIDE 0.9 % IV SOLN
INTRAVENOUS | Status: DC | PRN
  Administered 2022-04-28 (×3): 10 mL/h via INTRAVENOUS

## 2022-04-28 MED ORDER — SODIUM CHLORIDE 0.9% FLUSH: 3.0000 mL | INTRAVENOUS | Status: DC | PRN

## 2022-04-28 MED ORDER — SODIUM CHLORIDE 0.9% FLUSH
3.0000 mL | Freq: Two times a day (BID) | INTRAVENOUS | Status: DC
  Administered 2022-04-28 – 2022-05-04 (×12): 3 mL via INTRAVENOUS

## 2022-04-28 MED ORDER — MILRINONE LACTATE IN DEXTROSE 20-5 MG/100ML-% IV SOLN
0.1250 ug/kg/min | INTRAVENOUS | Status: DC
  Administered 2022-04-28 – 2022-04-29 (×2): 0.25 ug/kg/min via INTRAVENOUS
  Administered 2022-04-30 – 2022-05-01 (×2): 0.125 ug/kg/min via INTRAVENOUS
  Filled 2022-04-28 (×4): qty 100

## 2022-04-28 MED ORDER — CHLORHEXIDINE GLUCONATE CLOTH 2 % EX PADS
6.0000 | MEDICATED_PAD | Freq: Every day | CUTANEOUS | Status: DC
  Administered 2022-04-28 – 2022-05-04 (×7): 6 via TOPICAL

## 2022-04-28 MED ORDER — SODIUM CHLORIDE 0.9 % IV SOLN: 250.0000 mL | INTRAVENOUS | Status: DC | PRN

## 2022-04-28 MED ORDER — HYDRALAZINE HCL 20 MG/ML IJ SOLN: 10.0000 mg | INTRAMUSCULAR | Status: AC | PRN

## 2022-04-28 MED ORDER — ASPIRIN 81 MG PO CHEW
81.0000 mg | CHEWABLE_TABLET | ORAL | Status: AC
  Administered 2022-04-28: 81 mg via ORAL
  Filled 2022-04-28: qty 1

## 2022-04-28 MED ORDER — SODIUM CHLORIDE 0.9% FLUSH
3.0000 mL | INTRAVENOUS | Status: DC | PRN
  Administered 2022-04-29 – 2022-05-03 (×2): 3 mL via INTRAVENOUS

## 2022-04-28 MED ORDER — MIDAZOLAM HCL 2 MG/2ML IJ SOLN
INTRAMUSCULAR | Status: AC
  Filled 2022-04-28: qty 2

## 2022-04-28 SURGICAL SUPPLY — 11 items
CATH BALLN WEDGE 5F 110CM (CATHETERS) IMPLANT
DRAPE BRACHIAL (DRAPES) IMPLANT
KIT CV MULTILUMEN 7FR 20 (SET/KITS/TRAYS/PACK) ×2
KIT CV MULTILUMEN 7FR 20 SUB (SET/KITS/TRAYS/PACK) IMPLANT
KIT MICROPUNCTURE NIT STIFF (SHEATH) IMPLANT
PACK CARDIAC CATH (CUSTOM PROCEDURE TRAY) ×2 IMPLANT
PAD ELECT DEFIB RADIOL ZOLL (MISCELLANEOUS) IMPLANT
PROTECTION STATION PRESSURIZED (MISCELLANEOUS) ×2
SET ATX-X65L (MISCELLANEOUS) IMPLANT
SHEATH AVANTI 5FR X 11CM (SHEATH) IMPLANT
STATION PROTECTION PRESSURIZED (MISCELLANEOUS) IMPLANT

## 2022-04-28 NOTE — Interval H&P Note (Signed)
History and Physical Interval Note:  04/28/2022 3:34 PM  Hector [name]  has presented today for surgery, with the diagnosis of acute on chronic systolic heart failure.  The various methods of treatment have been discussed with the patient and family. After consideration of risks, benefits and other options for treatment, the patient has consented to  Procedure(s): RIGHT HEART CATH (N/A) as a surgical intervention.  The patient's history has been reviewed, patient examined, no change in status, stable for surgery.  I have reviewed the patient's chart and labs.  Questions were answered to the patient's satisfaction.  We discussed leaving a central line in place should we find low output HF necessitating inotrope therapy.  [name] is in agreement with this.   Tiffny Gemmer

## 2022-04-28 NOTE — Progress Notes (Signed)
[facility]  ROUNDING NOTE   Subjective:   Hector [name] is a 57 y.o. male with past medical conditions including hypertension, paroxysmal A-fib on Eliquis, CHF with a EF less than 20%, and chronic kidney disease stage IV. Patient presents to the emergency department with lower extremity edema and shortness of breath. He has been admitted for Hyperkalemia [E87.5] CHF exacerbation (HCC) [I50.9] Stage 4 chronic kidney disease ([place]) [N18.4] Acute on chronic congestive heart failure, unspecified heart failure type [facility]) [I50.9]  Patient is known to our practice from previous hospital admissions and is followed outpatient by [facility] nephrology.    Patient laying in bed NPO for RHC later today Remains on room air No lower extremity edema   Objective:  Vital signs in last 24 hours:  Temp:  [97.3 F (36.3 C)-98.1 F (36.7 C)] 97.5 F (36.4 C) (03/05 1144) Pulse Rate:  [54-96] 54 (03/05 1144) Resp:  [16-20] 18 (03/05 1144) BP: (100-115)/(72-94) 101/76 (03/05 1144) SpO2:  [65 %-100 %] 65 % (03/05 1144) Weight:  [109 kg] 109 kg (03/05 0800)  Weight change:  Filed Weights   04/26/22 0721 04/27/22 0500 04/28/22 0800  Weight: 110.5 kg 108.3 kg 109 kg    Intake/Output: I/O last 3 completed shifts: In: 731.5 [P.O.:240; I.V.:491.5] Out: 1300 [Urine:1300]   Intake/Output this shift:  Total I/O In: 0  Out: 620 [Urine:620]  Physical Exam: General: NAD  Head: Normocephalic, atraumatic. Moist oral mucosal membranes  Eyes: Anicteric  Lungs:  Clear to auscultation, normal effort  Heart: Regular rate and rhythm  Abdomen:  Soft, nontender, nondistended  Extremities: No peripheral edema.  Neurologic: Nonfocal, moving all four extremities  Skin: No lesions  Access: None    Basic Metabolic Panel: Recent Labs  Lab 04/25/22 1821 04/25/22 2328 04/26/22 0310 04/26/22 [id number] 04/26/22 0824 04/27/22 0259 04/28/22 0552  NA 132*  --   --   --  132* 132* 128*  K 6.2*   <  > 5.1 4.6 4.5 4.1 3.8  CL 97*  --   --   --  99 96* 94*  CO2 25  --   --   --  18* 22 26  GLUCOSE 123*  --   --   --  103* 109* 115*  BUN 60*  --   --   --  63* 67* 69*  CREATININE 3.15*  --   --   --  3.38* 3.61* 3.69*  CALCIUM 8.6*  --   --   --  8.8* 8.3* 8.1*  MG 2.5*  --   --   --   --   --   --    < > = values in this interval not displayed.     Liver Function Tests: Recent Labs  Lab 04/25/22 1821 04/26/22 0824 04/27/22 0259 04/28/22 0552  AST 33 '23 23 21  '$ ALT '13 16 18 20  '$ ALKPHOS 94 93 88 93  BILITOT 4.8* 5.4* 5.1* 4.6*  PROT 7.7 6.8 6.5 6.5  ALBUMIN 3.5 3.5 3.1* 3.1*    Recent Labs  Lab 04/25/22 1821  LIPASE 24    No results for input(s): "AMMONIA" in the last 168 hours.  CBC: Recent Labs  Lab 04/25/22 1821 04/26/22 1646 04/27/22 0259  WBC 8.0 7.8 8.2  HGB 12.7* 11.9* 11.8*  HCT 40.7 36.9* 36.2*  MCV 87.2 83.9 84.2  PLT 138* 223 230     Cardiac Enzymes: Recent Labs  Lab 04/26/22 1646  CKTOTAL 291  BNP: Invalid input(s): "POCBNP"  CBG: No results for input(s): "GLUCAP" in the last 168 hours.  Microbiology: Results for orders placed or performed during the hospital encounter of 04/25/22  Resp panel by RT-PCR (RSV, Flu A&B, Covid) Anterior Nasal Swab     Status: None   Collection Time: 04/25/22  8:39 PM   Specimen: Anterior Nasal Swab  Result Value Ref Range Status   SARS Coronavirus 2 by RT PCR NEGATIVE NEGATIVE Final    Comment: (NOTE) SARS-CoV-2 target nucleic acids are NOT DETECTED.  The SARS-CoV-2 RNA is generally detectable in upper respiratory specimens during the acute phase of infection. The lowest concentration of SARS-CoV-2 viral copies this assay can detect is 138 copies/mL. A negative result does not preclude SARS-Cov-2 infection and should not be used as the sole basis for treatment or other patient management decisions. A negative result may occur with  improper specimen collection/handling, submission of specimen  other than nasopharyngeal swab, presence of viral mutation(s) within the areas targeted by this assay, and inadequate number of viral copies(<138 copies/mL). A negative result must be combined with clinical observations, patient history, and epidemiological information. The expected result is Negative.  Fact Sheet for Patients:  EntrepreneurPulse.com.au  Fact Sheet for Healthcare Providers:  IncredibleEmployment.be  This test is no t yet approved or cleared by the [place] FDA and  has been authorized for detection and/or diagnosis of SARS-CoV-2 by FDA under an Emergency Use Authorization (EUA). This EUA will remain  in effect (meaning this test can be used) for the duration of the COVID-19 declaration under Section 564(b)(1) of the Act, 21 U.S.C.section 360bbb-3(b)(1), unless the authorization is terminated  or revoked sooner.       Influenza A by PCR NEGATIVE NEGATIVE Final   Influenza B by PCR NEGATIVE NEGATIVE Final    Comment: (NOTE) The Xpert Xpress SARS-CoV-2/FLU/RSV plus assay is intended as an aid in the diagnosis of influenza from Nasopharyngeal swab specimens and should not be used as a sole basis for treatment. Nasal washings and aspirates are unacceptable for Xpert Xpress SARS-CoV-2/FLU/RSV testing.  Fact Sheet for Patients: EntrepreneurPulse.com.au  Fact Sheet for Healthcare Providers: IncredibleEmployment.be  This test is not yet approved or cleared by the [place] FDA and has been authorized for detection and/or diagnosis of SARS-CoV-2 by FDA under an Emergency Use Authorization (EUA). This EUA will remain in effect (meaning this test can be used) for the duration of the COVID-19 declaration under Section 564(b)(1) of the Act, 21 U.S.C. section 360bbb-3(b)(1), unless the authorization is terminated or revoked.     Resp Syncytial Virus by PCR NEGATIVE NEGATIVE Final     Comment: (NOTE) Fact Sheet for Patients: EntrepreneurPulse.com.au  Fact Sheet for Healthcare Providers: IncredibleEmployment.be  This test is not yet approved or cleared by the [place] FDA and has been authorized for detection and/or diagnosis of SARS-CoV-2 by FDA under an Emergency Use Authorization (EUA). This EUA will remain in effect (meaning this test can be used) for the duration of the COVID-19 declaration under Section 564(b)(1) of the Act, 21 U.S.C. section 360bbb-3(b)(1), unless the authorization is terminated or revoked.  Performed at [facility], [address] [place], [place] [address]     Coagulation Studies: Recent Labs    04/26/22 0824 04/26/22 1646  LABPROT 19.8* 22.1*  INR 1.7* 2.0*     Urinalysis: No results for input(s): "COLORURINE", "LABSPEC", "PHURINE", "GLUCOSEU", "HGBUR", "BILIRUBINUR", "KETONESUR", "PROTEINUR", "UROBILINOGEN", "NITRITE", "LEUKOCYTESUR" in the last 72 hours.  Invalid input(s): "  APPERANCEUR"    Imaging: No results found.   Medications:    sodium chloride     [START ON 04/29/2022] sodium chloride     heparin 1,300 Units/hr (04/28/22 0833)    aspirin  81 mg Oral Pre-Cath   carvedilol  12.5 mg Oral BID WC   insulin aspart  0-5 Units Subcutaneous QHS   insulin aspart  0-9 Units Subcutaneous TID WC   levothyroxine  100 mcg Oral Q0600   lipase/protease/amylase  36,000 Units Oral TID WC   polyethylene glycol  34 g Oral BID   sodium chloride flush  3 mL Intravenous Q12H   sodium chloride, acetaminophen **OR** acetaminophen, ondansetron **OR** ondansetron (ZOFRAN) IV, oxyCODONE, sodium chloride flush  Assessment/ Plan:  Mr. JACEK KOCINSKI is a 57 y.o.  male with past medical conditions including hypertension, paroxysmal A-fib on Eliquis, CHF with a EF less than 20%, and chronic kidney disease stage IV. Patient presents to the emergency department with lower extremity  edema and shortness of breath. He has been admitted for   Acute Kidney Injury with hyponatremia on chronic kidney disease stage 4 with baseline creatinine 2.83 and GFR of 25 on 03/02/22.  Acute kidney injury secondary to fluid overload and cardiorenal.  And chronic kidney disease is secondary to hypertension.   Diuretics remain held.  Continue to avoid nephrotoxic agents and therapies, if possible. No indication for dialysis.   Lab Results  Component Value Date   CREATININE 3.69 (H) 04/28/2022   CREATININE 3.61 (H) 04/27/2022   CREATININE 3.38 (H) 04/26/2022    Intake/Output Summary (Last 24 hours) at 04/28/2022 1153 Last data filed at 04/28/2022 1111 Gross per 24 hour  Intake 561.2 ml  Output 870 ml  Net -308.8 ml    2. Anemia of chronic kidney disease Lab Results  Component Value Date   HGB 11.8 (L) 04/27/2022    Hgb at goal. Will monitor.   3. Acute on chronic systolic and diastolic heart failure. ECHO from 12/08/2021 shows EF less than 20% with a moderate to severe MVR. Diuretics remain held    LOS: 2 [place] 3/5/202411:53 AM

## 2022-04-28 NOTE — Plan of Care (Signed)
  Problem: Education: Goal: Knowledge of General Education information will improve Description: Including pain rating scale, medication(s)/side effects and non-pharmacologic comfort measures Outcome: Not Progressing   Problem: Health Behavior/Discharge Planning: Goal: Ability to manage health-related needs will improve Outcome: Not Progressing Note: Patient refuses interventions and prescribed medications.   Problem: Elimination: Goal: Will not experience complications related to bowel motility Outcome: Progressing

## 2022-04-28 NOTE — Assessment & Plan Note (Signed)
Clinically appears euvolemic with elevated BNP based on renal function. Can be due to diuretic use.  Sodium at 128 today which is little worsened then 132 -Continue to monitor

## 2022-04-28 NOTE — Assessment & Plan Note (Signed)
Bilirubin chronically elevated around 2.5 but now 4.8>>5.4 . INR 1.7 suspect related to hepatic congestion from CHF Had a HIDA scan July 2023 for bilirubin around 3 that showed "Abnormally low gallbladder ejection fraction equal to 6% indicative of decreased gallbladder emptying and gallbladder dysfunction" Renal ultrasound with concern of cirrhosis. GI was consulted and patient might need EGD and if that is normal they were recommending surgical evaluation.

## 2022-04-28 NOTE — Progress Notes (Signed)
[place] for IV heparin Indication: atrial fibrillation  Allergies  Allergen Reactions   Esomeprazole Magnesium Other (See Comments)    Suspected interstitial nephritis 2018   Eggs Or Egg-Derived Products Rash    Patient Measurements: Height: '5\' 11"'$  (180.3 cm) Weight: 108.3 kg (238 lb 10.4 oz) IBW/kg (Calculated) : 75.3 Heparin Dosing Weight: 99.2 kg  Vital Signs: Temp: 97.3 F (36.3 C) (03/05 0319) Temp Source: Oral (03/04 2326) BP: 115/94 (03/05 0319) Pulse Rate: 96 (03/05 0319)  Labs: Recent Labs    04/25/22 1821 04/25/22 1821 04/25/22 2045 04/26/22 0824 04/26/22 1646 04/26/22 1839 04/27/22 0259 04/27/22 1321 04/27/22 2142 04/28/22 0552 04/28/22 0712  HGB 12.7*  --   --   --  11.9*  --  11.8*  --   --   --   --   HCT 40.7  --   --   --  36.9*  --  36.2*  --   --   --   --   PLT 138*  --   --   --  223  --  230  --   --   --   --   APTT  --    < >  --   --  41*  --  139* 79* 54*  --  123*  LABPROT  --   --   --  19.8* 22.1*  --   --   --   --   --   --   INR  --   --   --  1.7* 2.0*  --   --   --   --   --   --   HEPARINUNFRC  --   --   --   --   --  >1.10* >1.10*  --   --   --  0.95*  CREATININE 3.15*  --   --  3.38*  --   --  3.61*  --   --  3.69*  --   CKTOTAL  --   --   --   --  291  --   --   --   --   --   --   TROPONINIHS 43*  --  48*  --   --   --   --   --   --   --   --    < > = values in this interval not displayed.    Estimated Creatinine Clearance: 27.6 mL/min (A) (by C-G formula based on SCr of 3.69 mg/dL (H)).  Medical History: Past Medical History:  Diagnosis Date   Alcohol abuse    Alcoholic cardiomyopathy ([place])    a. 12/2007 MV: EF 28%, no isch;  b. 8/12 Echo: EF 25-35%; c. 02/2014 Echo: EF 20-25%; d. 12/2014 Cath: minimal CAD; e. 01/2015 Echo: EF 50-55%;  d. 05/2016 Echo: EF 30-35%, diff HK, gr2 DD; e. 11/2016 Echo: EF 45-50%, diff HK; f. 09/2021 Echo: EF <20%; g. 11/2021 Echo: EF<20%.   Chronic  combined systolic (congestive) and diastolic (congestive) heart failure ([place])    a. 05/2016 Echo: EF 30-35%; b. 11/2016 Echo: EF 45-50%, diff HK; c. 09/2021 Echo: EF < 20%; d. 11/2021 Echo: EF <20%, no rwma, Nl RV fxn, sev dil LA, mod-sev MR. mod dil PA w/ mildly elev PASP.   CKD (chronic kidney disease), stage III (HCC)    Elevated troponin (chronic)    Essential hypertension    GI bleed 11/2013  Hyperlipidemia    Pancreatitis    Paroxysmal A-fib ([place])    a. new onset s/p unsuccessful TEE/DCCV on 08/16/2014; b. CHA2DS2VASc = 3-> eliquis (freq noncompliant); c. 02/2021 s/p TEE/DCCV; d. Prev on amio->d/c 2/2 abnl TFTs.   Paroxysmal atrial flutter (HCC)    Sleep-disordered breathing    Has yet to have a sleep study   Stroke [facility])     Medications:  Apixaban 5 mg BID PTA  Assessment: 57 year old male being admitted with acute on chronic systolic CHF. PMH includes Afib on Eliquis prior to admission, pancreatitis, sCHF with EF < 20%, CKD-IV, HTN, HLD, stroke, hypothyroidism, alcohol abuse in remission, GI bleeding, OSA, anemia, hospitalized 2 months ago with RSV.  Pharmacy has been consulted to start IV heparin due to possible procedure H&H stable.   Last dose Eliquis 3/3 AM '@0905'$  (> 8 hours since last dose)  Goal of Therapy:  aPTT 66-102 seconds HL goal 0.3-0.7   Monitor platelets by anticoagulation protocol: Yes   Results: 3/4 @ 0259:  aPTT = 139,  HL = > 1.10 3/4 '@1321'$  aPTT = 79, Therapeutic x 1, heparin rate 1200 units/hr 3/4 @ 2142 aPTT 54, subtherapeutic 3/5 @ 0712 aPTT = 123, HL = 0.95, SUPRAtherapeutic, rate 1500 units/hr  Plan:  Decrease heparin infusion to 1300 units/hr. Recheck aPTT  8 hrs after rate change Use aPTT to dose heparin until correlating with HL. Continue to monitor daily H&H and platelets while infusion  Kaylon Laroche Rodriguez-Guzman PharmD, BCPS 04/28/2022 8:20 AM

## 2022-04-28 NOTE — Progress Notes (Signed)
Progress Note   Patient: Hector [name] [id number] DOB: [birth year] DOA: 04/25/2022     2 DOS: the patient was seen and examined on 04/28/2022   Brief hospital course: Taken from H&P.   Hector [name] is a 57 y.o. male with medical history significant for A. fib on Eliquis, pancreatitis, sCHF with EF < 20%, CKD-IV, HTN, HLD, stroke, hypothyroidism, alcohol abuse in remission, GI bleeding, OSA, anemia, hospitalized 2 months ago with RSV who presents to the ED with cough and shortness of breath for the past 2 days as well as abdominal distention.  ED course and data review: BP 141/126 with heart rate in the low 100s.  Respirations 25-26 with O2 sat on room mid to high 90s.  Labs notable for troponin of 48, BNP pending.  CBC with normal BBC count platelets 1 38,000., respiratory viral panel negative.  Creatinine near baseline at 3.5, potassium 6.2, sodium 132.  Hepatic function panel with total bilirubin 4.8. EKG,.Reviewed and interpreted showing sinus at 106 with nonspecific ST-T wave changes chest x-ray shows.Findings suspicious for developing pulmonary edema. Patient treated with IV Lasix for CHF, given a dose of calcium gluconate and Veltassa for hyperkalemia.  3/3: Vitals stable.  Hyperkalemia has been resolved with potassium of 4.6, Veltassa discontinued.  Patient was on potassium supplement at home which should be discontinued. BNP elevated at 736, patient also has CKD stage IV.  Troponin mildly positive with a flat curve.  RUQ ultrasound with cholelithiasis and gallbladder sludge without complicating factors.  Findings also suspicious for hepatic cirrhosis.  Patient quit drinking 13 years ago. Worsening T. bili and INR.  Concern of cardiohepatic and cardiorenal. Clinically appears euvolemic. Cardiology and nephrology were consulted.  Addendum.  Cardiology is advising holding amiodarone, increasing the dose of carvedilol and switching Eliquis with heparin.  Patient might need right  heart cath to see the pressures.  They were also recommending involving GI as his liver abnormalities does not really match with hepatic congestion.  Message sent to Dr.Vanga.  3/4: Hemodynamically stable.  Worsening renal function, nephrology is advising holding diuresis.  Clinically appears euvolemic but significant worsening of BNP in 1 day.  CK has been normalized.  T. bili seems stable with slight worsening of INR to 2. Patient might need milrinone for concern of low output heart failure, might need to go under advanced heart failure team, cardiology to decide regarding transfer.  3/5; vitals stable.  Renal function continued to get worse.  Sodium at 128 most likely due to worsening renal function.  Going for right heart catheterization for further investigation of his low output decompensated heart failure.  GI also recommended EGD to rule out any peptic ulcer disease causing his epigastric pain and if that is negative they were recommending general surgery evaluation for cholecystectomy as prior HIDA scan done last year with low EF.  They also started him on Creon for history of atrophic pancreas.  Patient is very high risk for deterioration and mortality based on advance heart failure, worsening renal function and other underlying comorbidities.  Assessment and Plan: * Acute on chronic systolic CHF (congestive heart failure) (HCC) Nonischemic cardiomyopathy, EF less than 20% not previously a candidate for ICD due to noncompliance Patient presents with shortness of breath, abdominal distention, with chest x-ray suspicious for developing pulmonary edema Troponin elevated to 48 but patient denies chest pain and EKG nonacute.  ACS not suspected Suspect medication noncompliance based on fill history for medication Holding Lasix and metolazone with worsening  renal function. Continue carvedilol  Daily weights with intake and output monitoring Last echo October 2023 showed EF less than 20% Patient  was previously deemed not to be ICD insertion by EP due to noncompliance  Patient was last seen by his cardiologist Dr. Haroldine Laws in December 2023 at which time metolazone was added to his diuretic regimen, weight was 247 pounds at the time Reds vest Cardiology was consulted-going for right heart catheterization today for further evaluation of his decompensated low output heart failure  Nonischemic cardiomyopathy: EF 20-25% Please see above  Hyperkalemia Potassium was 6.2 on admission which has been improved. Received calcium gluconate and Veltassa in the ED Patient was on potassium supplement at home which need to be discontinued. Worsening renal function can be contributory. -Discontinuing Veltassa and keep monitoring  Atrial fibrillation with RVR (HCC) Heart rate in low 100.  Has had DC cardioversion in the past Continue amiodarone and carvedilol Continue apixaban for primary stroke prevention  Hyponatremia Clinically appears euvolemic with elevated BNP based on renal function. Can be due to diuretic use.  Sodium at 128 today which is little worsened then 132 -Continue to monitor  Chronic renal impairment, stage 4 (severe) (HCC) AKI with history of CKD stage IV.  Creatinine at 3.69 today with baseline around 3 -Holding diuretic -Concern of cardiorenal -Nephrology consult  Hypothyroidism Continue levothyroxine  Type II diabetes mellitus with renal manifestations ([place]) Hold Invokana due to worsening renal function Sliding scale insulin coverage  Hyperbilirubinemia Bilirubin chronically elevated around 2.5 but now 4.8>>5.4 . INR 1.7 suspect related to hepatic congestion from CHF Had a HIDA scan July 2023 for bilirubin around 3 that showed "Abnormally low gallbladder ejection fraction equal to 6% indicative of decreased gallbladder emptying and gallbladder dysfunction" Renal ultrasound with concern of cirrhosis. GI was consulted and patient might need EGD and if that is  normal they were recommending surgical evaluation.  Thrombocytopenia (HCC) Mild thrombocytopenia which seems chronic.  -Continue to monitor  Alcohol use, unspecified, in remission Patient does not currently drink, per patient he quit drinking 13 years ago  History of GI bleed No acute concerns    Subjective: Patient was resting comfortably when seen today.  Awaiting heart catheterization.  Belly pain with some improvement today.  Physical Exam: Vitals:   04/28/22 0800 04/28/22 0927 04/28/22 1144 04/28/22 1436  BP:  (!) 112/92 101/76 (!) 104/90  Pulse:  61 (!) 54 89  Resp:  '18 18 18  '$ Temp:  (!) 97.5 F (36.4 C) (!) 97.5 F (36.4 C) (!) 97.5 F (36.4 C)  TempSrc:    Oral  SpO2:  97% (!) 65% 90%  Weight: 109 kg     Height:       General.  Chronically ill-appearing gentleman, in no acute distress. Pulmonary.  Lungs clear bilaterally, normal respiratory effort. CV.  Regular rate and rhythm, no JVD, rub or murmur. Abdomen.  Soft, nontender, nondistended, BS positive. CNS.  Alert and oriented .  No focal neurologic deficit. Extremities.  No edema, no cyanosis, pulses intact and symmetrical. Psychiatry.  Judgment and insight appears normal.    Data Reviewed: Prior data reviewed  Family Communication: Discussed with patient  Disposition: Status is: Inpatient Remains inpatient appropriate because: Severity of illness  Planned Discharge Destination: Home  DVT prophylaxis.  Heparin Time spent: 51 minutes  This record has been created using Systems analyst. Errors have been sought and corrected,but may not always be located. Such creation errors do not reflect on the  standard of care.   Author: Lorella Nimrod, MD 04/28/2022 3:19 PM  For on call review [email or web address].

## 2022-04-28 NOTE — Progress Notes (Signed)
Rounding Note    Patient Name: Hector [name] Date of Encounter: 04/28/2022  [place] Cardiologist: Ida Rogue, MD   Subjective   Plan for [place] today. Kidney function stable. Na 128. No chest pain reported.   Inpatient Medications    Scheduled Meds:  aspirin  81 mg Oral Pre-Cath   carvedilol  12.5 mg Oral BID WC   insulin aspart  0-5 Units Subcutaneous QHS   insulin aspart  0-9 Units Subcutaneous TID WC   levothyroxine  100 mcg Oral Q0600   lipase/protease/amylase  36,000 Units Oral TID WC   polyethylene glycol  34 g Oral BID   sodium chloride flush  3 mL Intravenous Q12H   Continuous Infusions:  sodium chloride     [START ON 04/29/2022] sodium chloride     heparin 1,300 Units/hr (04/28/22 0833)   PRN Meds: sodium chloride, acetaminophen **OR** acetaminophen, ondansetron **OR** ondansetron (ZOFRAN) IV, oxyCODONE, sodium chloride flush   Vital Signs    Vitals:   04/27/22 2326 04/28/22 0319 04/28/22 0800 04/28/22 0927  BP: (!) 106/92 (!) 115/94  (!) 112/92  Pulse: 90 96  61  Resp: '20 20  18  '$ Temp: (!) 97.4 F (36.3 C) (!) 97.3 F (36.3 C)  (!) 97.5 F (36.4 C)  TempSrc: Oral     SpO2: 98% 98%  97%  Weight:   109 kg   Height:        Intake/Output Summary (Last 24 hours) at 04/28/2022 1141 Last data filed at 04/28/2022 1111 Gross per 24 hour  Intake 561.2 ml  Output 870 ml  Net -308.8 ml      04/28/2022    8:00 AM 04/27/2022    5:00 AM 04/26/2022    7:21 AM  Last 3 Weights  Weight (lbs) 240 lb 4.8 oz 238 lb 10.4 oz 243 lb 8 oz  Weight (kg) 109 kg 108.25 kg 110.451 kg      Telemetry    Afib PVCs, HR 80s - Personally Reviewed  ECG    No new - Personally Reviewed  Physical Exam   GEN: No acute distress.   Neck: + JVD Cardiac: RRR, no murmurs, rubs, or gallops.  Respiratory: Clear to auscultation bilaterally. GI: Soft, nontender, non-distended  MS: trace edema; No deformity. Neuro:  Nonfocal  Psych: Normal affect   Labs     High Sensitivity Troponin:   Recent Labs  Lab 04/25/22 1821 04/25/22 2045  TROPONINIHS 43* 48*     Chemistry Recent Labs  Lab 04/25/22 1821 04/25/22 2328 04/26/22 0824 04/27/22 0259 04/28/22 0552  NA 132*  --  132* 132* 128*  K 6.2*   < > 4.5 4.1 3.8  CL 97*  --  99 96* 94*  CO2 25  --  18* 22 26  GLUCOSE 123*  --  103* 109* 115*  BUN 60*  --  63* 67* 69*  CREATININE 3.15*  --  3.38* 3.61* 3.69*  CALCIUM 8.6*  --  8.8* 8.3* 8.1*  MG 2.5*  --   --   --   --   PROT 7.7  --  6.8 6.5 6.5  ALBUMIN 3.5  --  3.5 3.1* 3.1*  AST 33  --  '23 23 21  '$ ALT 13  --  '16 18 20  '$ ALKPHOS 94  --  93 88 93  BILITOT 4.8*  --  5.4* 5.1* 4.6*  GFRNONAA 22*  --  20* 19* 18*  ANIONGAP 10  --  $'15 14 8   'P$ < > = values in this interval not displayed.    Lipids No results for input(s): "CHOL", "TRIG", "HDL", "LABVLDL", "LDLCALC", "CHOLHDL" in the last 168 hours.  Hematology Recent Labs  Lab 04/25/22 1821 04/26/22 1646 04/27/22 0259  WBC 8.0 7.8 8.2  RBC 4.67 4.40 4.30  HGB 12.7* 11.9* 11.8*  HCT 40.7 36.9* 36.2*  MCV 87.2 83.9 84.2  MCH 27.2 27.0 27.4  MCHC 31.2 32.2 32.6  RDW 20.6* 20.6* 20.7*  PLT 138* 223 230   Thyroid No results for input(s): "TSH", "FREET4" in the last 168 hours.  BNP Recent Labs  Lab 04/26/22 1646  BNP 2,109.1*    DDimer No results for input(s): "DDIMER" in the last 168 hours.   Radiology    No results found.  Cardiac Studies   Limited echo 11/2021 1. Left ventricular ejection fraction, by estimation, is <20%. The left  ventricle has severely decreased function. The left ventricle has no  regional wall motion abnormalities. The left ventricular internal cavity  size was mildly to moderately dilated.   2. Right ventricular systolic function is normal. The right ventricular  size is normal. There is mildly elevated pulmonary artery systolic  pressure.   3. Left atrial size was severely dilated.   4. Right atrial size was mildly dilated.   5. The  mitral valve is normal in structure. Moderate to severe mitral  valve regurgitation. No evidence of mitral stenosis.   6. The aortic valve is normal in structure. Aortic valve regurgitation is  not visualized. No aortic stenosis is present.   7. Moderately dilated pulmonary artery.   8. The inferior vena cava is dilated in size with <50% respiratory  variability, suggesting right atrial pressure of 15 mmHg.    Echo 09/2021 1. Left ventricular ejection fraction, by estimation, is <20%. The left  ventricle has severely decreased function. The left ventricle has no  regional wall motion abnormalities. The left ventricular internal cavity  size was moderately dilated. Left  ventricular diastolic parameters are indeterminate.   2. Right ventricular systolic function is mildly reduced. The right  ventricular size is normal. Tricuspid regurgitation signal is inadequate  for assessing PA pressure.   3. Left atrial size was moderately dilated.   4. Right atrial size was mildly dilated.   5. The mitral valve is normal in structure. Moderate mitral valve  regurgitation. No evidence of mitral stenosis.   6. The aortic valve is normal in structure. Aortic valve regurgitation is  not visualized. Aortic valve sclerosis/calcification is present, without  any evidence of aortic stenosis.   7. The inferior vena cava is dilated in size with <50% respiratory  variability, suggesting right atrial pressure of 15 mmHg.   Patient Profile     57 y.o. male with a h/o NICM, CAD, CKD stage 4, PAF on Eliquis, HL, DM2, ETOH use, stroke, hypothyroidism, GIB, OSA, anemia who is being seen for CHF.    Assessment & Plan    HFrEF NICM LVEF less than 20% -multiple admission over the last 3 months for CHF - presented with SOB and abdominal distention and vomiting x 2. - BNP 2,109 - PTA Invokana '100mg'$  daily, metolazone 2.'5mg'$  daily and Torsemide '60mg'$  daily>held - IV lasix '40mg'$  BID>held 3/3 for worsening kidney  function - continue Coreg 12.'5mg'$  BID - Net -1.7L - no ACE/ARB/ARNI/MRA due to CKD - Echo 11/2021 showed LVEF <20% - patient is not ICD candidate due to noncompliance. He follows with Advanced  heart failure team - Likely a degree of low output heart failure, may need milrinone. Plan for RHC today.   RUQ discomfort - bili elevated - lipitor and amiodarone stopped - [place] RUQ showed possible hepatic cirrhosis - GI planning on EGD   Afib - he has had cardioversion in the past - Coreg for rate control - plan was for amio load with DCCV, however this was canceled due to admission - amiodarone held - Eliquis stopped and started on IV heparin for procedure   Elevated troponin CAD - suspect supply demand mismatch in the setting of poor LV function and ongoing medical problems - minimal nonobstructive CAD by cath in 2016 - statin held as above   CKD stage 4 - Scr 3.5 on admission - Baseline 2.8-3.4   Hyperkalemia - K 6.2 on admission s/p calcium gluconate and Veltassa - resolved  For questions or updates, please contact [place] Please consult [email or web address] for contact info under        Signed, Jinger Middlesworth Ninfa Meeker, PA-C  04/28/2022, 11:41 AM

## 2022-04-29 ENCOUNTER — Encounter: Payer: Self-pay | Admitting: Internal Medicine

## 2022-04-29 DIAGNOSIS — I5023 Acute on chronic systolic (congestive) heart failure: Secondary | ICD-10-CM | POA: Diagnosis not present

## 2022-04-29 DIAGNOSIS — I428 Other cardiomyopathies: Secondary | ICD-10-CM | POA: Diagnosis not present

## 2022-04-29 DIAGNOSIS — E875 Hyperkalemia: Secondary | ICD-10-CM | POA: Diagnosis not present

## 2022-04-29 DIAGNOSIS — I4891 Unspecified atrial fibrillation: Secondary | ICD-10-CM | POA: Diagnosis not present

## 2022-04-29 LAB — COOXEMETRY PANEL
Carboxyhemoglobin: 2.2 % — ABNORMAL HIGH (ref 0.5–1.5)
Methemoglobin: 0.9 % (ref 0.0–1.5)
O2 Saturation: 80.3 %
Total hemoglobin: 12.1 g/dL (ref 12.0–16.0)
Total oxygen content: 77.8 %

## 2022-04-29 LAB — BASIC METABOLIC PANEL
Anion gap: 12 (ref 5–15)
BUN: 69 mg/dL — ABNORMAL HIGH (ref 6–20)
CO2: 25 mmol/L (ref 22–32)
Calcium: 8.2 mg/dL — ABNORMAL LOW (ref 8.9–10.3)
Chloride: 97 mmol/L — ABNORMAL LOW (ref 98–111)
Creatinine, Ser: 3.61 mg/dL — ABNORMAL HIGH (ref 0.61–1.24)
GFR, Estimated: 19 mL/min — ABNORMAL LOW (ref >60)
Glucose, Bld: 169 mg/dL — ABNORMAL HIGH (ref 70–99)
Potassium: 3.6 mmol/L (ref 3.5–5.1)
Sodium: 134 mmol/L — ABNORMAL LOW (ref 135–145)

## 2022-04-29 LAB — CBC
HCT: 36.1 % — ABNORMAL LOW (ref 39.0–52.0)
Hemoglobin: 11.6 g/dL — ABNORMAL LOW (ref 13.0–17.0)
MCH: 27.3 pg (ref 26.0–34.0)
MCHC: 32.1 g/dL (ref 30.0–36.0)
MCV: 84.9 fL (ref 80.0–100.0)
Platelets: 194 10*3/uL (ref 150–400)
RBC: 4.25 MIL/uL (ref 4.22–5.81)
RDW: 21.3 % — ABNORMAL HIGH (ref 11.5–15.5)
WBC: 8.3 10*3/uL (ref 4.0–10.5)
nRBC: 0 % (ref 0.0–0.2)

## 2022-04-29 LAB — GLUCOSE, CAPILLARY: Glucose-Capillary: 101 mg/dL — ABNORMAL HIGH (ref 70–99)

## 2022-04-29 LAB — APTT
aPTT: 54 seconds — ABNORMAL HIGH (ref 24–36)
aPTT: 78 seconds — ABNORMAL HIGH (ref 24–36)

## 2022-04-29 LAB — MAGNESIUM: Magnesium: 2.5 mg/dL — ABNORMAL HIGH (ref 1.7–2.4)

## 2022-04-29 LAB — HEPARIN LEVEL (UNFRACTIONATED)
Heparin Unfractionated: 0.46 IU/mL (ref 0.30–0.70)
Heparin Unfractionated: 0.31 IU/mL (ref 0.30–0.70)

## 2022-04-29 MED ORDER — MILRINONE LACTATE IN DEXTROSE 20-5 MG/100ML-% IV SOLN: 0.2500 ug/kg/min | INTRAVENOUS | Status: DC

## 2022-04-29 MED ORDER — SPIRONOLACTONE 25 MG PO TABS
25.0000 mg | ORAL_TABLET | Freq: Every day | ORAL | Status: DC
  Administered 2022-04-29 – 2022-04-30 (×2): 25 mg via ORAL
  Filled 2022-04-29 (×2): qty 1

## 2022-04-29 NOTE — Progress Notes (Signed)
[place] for IV heparin Indication: atrial fibrillation  Allergies  Allergen Reactions   Esomeprazole Magnesium Other (See Comments)    Suspected interstitial nephritis 2018   Eggs Or Egg-Derived Products Rash    Patient Measurements: Height: '5\' 11"'$  (180.3 cm) Weight: 107.2 kg (236 lb 5.3 oz) IBW/kg (Calculated) : 75.3 Heparin Dosing Weight: 99.2 kg  Vital Signs: Temp: 98.4 F (36.9 C) (03/06 1500) Temp Source: Oral (03/06 1500) BP: 103/90 (03/06 1800) Pulse Rate: 54 (03/06 1800)  Labs: Recent Labs    04/27/22 0259 04/27/22 1321 04/28/22 0552 04/28/22 0712 04/29/22 0102 04/29/22 0357 04/29/22 1028 04/29/22 2026  HGB 11.8*  --   --   --   --  11.6*  --   --   HCT 36.2*  --   --   --   --  36.1*  --   --   PLT 230  --   --   --   --  194  --   --   APTT 139*   < >  --  123* 54*  --  78*  --   HEPARINUNFRC >1.10*  --   --  0.95*  --   --  0.46 0.31  CREATININE 3.61*  --  3.69*  --   --  3.61*  --   --    < > = values in this interval not displayed.    Estimated Creatinine Clearance: 28.1 mL/min (A) (by C-G formula based on SCr of 3.61 mg/dL (H)).  Medical History: Past Medical History:  Diagnosis Date   Alcohol abuse    Alcoholic cardiomyopathy ([place])    a. 12/2007 MV: EF 28%, no isch;  b. 8/12 Echo: EF 25-35%; c. 02/2014 Echo: EF 20-25%; d. 12/2014 Cath: minimal CAD; e. 01/2015 Echo: EF 50-55%;  d. 05/2016 Echo: EF 30-35%, diff HK, gr2 DD; e. 11/2016 Echo: EF 45-50%, diff HK; f. 09/2021 Echo: EF <20%; g. 11/2021 Echo: EF<20%.   Chronic combined systolic (congestive) and diastolic (congestive) heart failure ([place])    a. 05/2016 Echo: EF 30-35%; b. 11/2016 Echo: EF 45-50%, diff HK; c. 09/2021 Echo: EF < 20%; d. 11/2021 Echo: EF <20%, no rwma, Nl RV fxn, sev dil LA, mod-sev MR. mod dil PA w/ mildly elev PASP.   CKD (chronic kidney disease), stage III (HCC)    Elevated troponin (chronic)    Essential hypertension    GI bleed 11/2013    Hyperlipidemia    Pancreatitis    Paroxysmal A-fib (HCC)    a. new onset s/p unsuccessful TEE/DCCV on 08/16/2014; b. CHA2DS2VASc = 3-> eliquis (freq noncompliant); c. 02/2021 s/p TEE/DCCV; d. Prev on amio->d/c 2/2 abnl TFTs.   Paroxysmal atrial flutter (HCC)    Sleep-disordered breathing    Has yet to have a sleep study   Stroke [facility])     Medications:  Apixaban 5 mg BID PTA  Assessment: 57 year old male being admitted with acute on chronic systolic CHF. PMH includes Afib on Eliquis prior to admission, pancreatitis, sCHF with EF < 20%, CKD-IV, HTN, HLD, stroke, hypothyroidism, alcohol abuse in remission, GI bleeding, OSA, anemia, hospitalized 2 months ago with RSV.  Pharmacy has been consulted to start IV heparin due to possible procedure H&H stable.   Last dose Eliquis 3/3 AM '@0905'$  (> 8 hours since last dose)  Goal of Therapy:  aPTT 66-102 seconds HL goal 0.3-0.7   Monitor platelets by anticoagulation protocol: Yes   Results: 3/4 @  0259:  aPTT = 139,  HL = > 1.10 3/4 '@1321'$  aPTT = 79, Therapeutic x 1, heparin rate 1200 units/hr 3/4 @ 2142 aPTT = 54, subtherapeutic 3/5 @ 0712 aPTT = 123, HL = 0.95, SUPRAtherapeutic, rate 1500 units/hr 3/6 @ 0102 aPTT = 54, subtherapeutic 3/6 @ 1028 aPTT = 78, HL = 0.46, therapeutic and correlating  Date Time aPTT/HL Rate/Comment 3/6 1028 78 / 0.46 Therapeutic + correlating / 1400 u/hr 3/6 2026 HL 0.31 Therapeutic x 2  Plan:  Continue heparin at current rate of 1400 un/hr Check heparin level tomorrow morning with AM labs Continue to monitor H&H and platelets daily while on heparin gtt.  [place] Pharmacist 04/29/2022 9:06 PM

## 2022-04-29 NOTE — Progress Notes (Signed)
Patient is currently in the ICU for management of acute on chronic CHF.  S/p right heart cath yesterday showed severely elevated left and right heart filling pressures and the patient was transferred to the ICU and started on milrinone.   GI will sign off at this time. Please notify GI when patient is medically optimized to proceed with inpatient EGD, closer to discharge  Sherri Sear, MD

## 2022-04-29 NOTE — Progress Notes (Signed)
Rounding Note    Patient Name: Hector [name] Date of Encounter: 04/29/2022  [place] Cardiologist: Ida Rogue, MD   Subjective   RHC yesterday showed severely elevated left and right heart filling pressures and the patient was transferred to the ICU and started on milrinone.  UOP -4.4L. kidney function stable. Patient reports dull chest discomfort, breathing is improving.   Inpatient Medications    Scheduled Meds:  Chlorhexidine Gluconate Cloth  6 each Topical Daily   furosemide  80 mg Intravenous BID   insulin aspart  0-5 Units Subcutaneous QHS   insulin aspart  0-9 Units Subcutaneous TID WC   levothyroxine  100 mcg Oral Q0600   lipase/protease/amylase  36,000 Units Oral TID WC   mupirocin ointment  1 Application Nasal BID   polyethylene glycol  34 g Oral BID   sodium chloride flush  3 mL Intravenous Q12H   sodium chloride flush  3 mL Intravenous Q12H   Continuous Infusions:  sodium chloride     heparin 1,400 Units/hr (04/29/22 0228)   milrinone 0.25 mcg/kg/min (04/29/22 0340)   PRN Meds: sodium chloride, acetaminophen **OR** acetaminophen, ondansetron **OR** ondansetron (ZOFRAN) IV, oxyCODONE, sodium chloride flush   Vital Signs    Vitals:   04/29/22 0300 04/29/22 0400 04/29/22 0500 04/29/22 0600  BP: (!) 112/90 107/82  102/70  Pulse: 95 98  (!) 41  Resp: (!) 24 (!) 24  (!) 24  Temp:      TempSrc:      SpO2: 100% 94%  98%  Weight:   107.2 kg   Height:        Intake/Output Summary (Last 24 hours) at 04/29/2022 0850 Last data filed at 04/29/2022 0300 Gross per 24 hour  Intake 348.65 ml  Output 4020 ml  Net -3671.35 ml      04/29/2022    5:00 AM 04/28/2022    5:30 PM 04/28/2022    8:00 AM  Last 3 Weights  Weight (lbs) 236 lb 5.3 oz 240 lb 15.4 oz 240 lb 4.8 oz  Weight (kg) 107.2 kg 109.3 kg 109 kg      Telemetry    Afib PVC HR 90-105 - Personally Reviewed  ECG    No new - Personally Reviewed  Physical Exam   GEN: No acute  distress.   Neck: + JVD Cardiac: Irreg Irreg, no murmurs, rubs, or gallops.  Respiratory: Clear to auscultation bilaterally. GI: Soft, nontender, non-distended  MS: No edema; No deformity. Neuro:  Nonfocal  Psych: Normal affect   Labs    High Sensitivity Troponin:   Recent Labs  Lab 04/25/22 1821 04/25/22 2045  TROPONINIHS 43* 48*     Chemistry Recent Labs  Lab 04/25/22 1821 04/25/22 2328 04/26/22 0824 04/27/22 0259 04/28/22 0552 04/29/22 0357  NA 132*  --  132* 132* 128* 134*  K 6.2*   < > 4.5 4.1 3.8 3.6  CL 97*  --  99 96* 94* 97*  CO2 25  --  18* '22 26 25  '$ GLUCOSE 123*  --  103* 109* 115* 169*  BUN 60*  --  63* 67* 69* 69*  CREATININE 3.15*  --  3.38* 3.61* 3.69* 3.61*  CALCIUM 8.6*  --  8.8* 8.3* 8.1* 8.2*  MG 2.5*  --   --   --   --  2.5*  PROT 7.7  --  6.8 6.5 6.5  --   ALBUMIN 3.5  --  3.5 3.1* 3.1*  --  AST 33  --  '23 23 21  '$ --   ALT 13  --  '16 18 20  '$ --   ALKPHOS 94  --  93 88 93  --   BILITOT 4.8*  --  5.4* 5.1* 4.6*  --   GFRNONAA 22*  --  20* 19* 18* 19*  ANIONGAP 10  --  '15 14 8 12   '$ < > = values in this interval not displayed.    Lipids No results for input(s): "CHOL", "TRIG", "HDL", "LABVLDL", "LDLCALC", "CHOLHDL" in the last 168 hours.  Hematology Recent Labs  Lab 04/26/22 1646 04/27/22 0259 04/29/22 0357  WBC 7.8 8.2 8.3  RBC 4.40 4.30 4.25  HGB 11.9* 11.8* 11.6*  HCT 36.9* 36.2* 36.1*  MCV 83.9 84.2 84.9  MCH 27.0 27.4 27.3  MCHC 32.2 32.6 32.1  RDW 20.6* 20.7* 21.3*  PLT 223 230 194   Thyroid No results for input(s): "TSH", "FREET4" in the last 168 hours.  BNP Recent Labs  Lab 04/26/22 1646  BNP 2,109.1*    DDimer No results for input(s): "DDIMER" in the last 168 hours.   Radiology    DG Chest Port 1 View  Result Date: 04/28/2022 CLINICAL DATA:  Central venous catheter placement. EXAM: PORTABLE CHEST 1 VIEW COMPARISON:  Chest radiograph dated 04/25/2022. FINDINGS: Right IJ central venous line with tip at the cavoatrial  junction. There is cardiomegaly with vascular congestion and edema, progressed since the prior radiograph. Superimposed pneumonia is not excluded. Small bilateral pleural effusions suspected. No pneumothorax. No acute osseous pathology. IMPRESSION: 1. Right IJ central venous line with tip at the cavoatrial junction. 2. Cardiomegaly with vascular congestion and edema. Electronically Signed   By: Anner Crete M.D.   On: 04/28/2022 17:15   CARDIAC CATHETERIZATION  Result Date: 04/28/2022 Conclusions: Severely elevated left and right heart filling pressures. Low Fick cardiac output/index. Successful placement of triple-lumen central venous catheter via the right internal jugular vein. Recommendations: Transfer to stepdown. Initiate milrinone 0.25 mcg/kg/min, with titration based on urine output and cooximetry. Hold carvedilol in the setting of low-output heart failure. Restart furosemide 80 mg IV BID; titrate as needed for net negative fluid balance of at least 2 L per 24 hours. Nelva Bush, MD [facility]   Cardiac Studies   Limited echo 11/2021 1. Left ventricular ejection fraction, by estimation, is <20%. The left  ventricle has severely decreased function. The left ventricle has no  regional wall motion abnormalities. The left ventricular internal cavity  size was mildly to moderately dilated.   2. Right ventricular systolic function is normal. The right ventricular  size is normal. There is mildly elevated pulmonary artery systolic  pressure.   3. Left atrial size was severely dilated.   4. Right atrial size was mildly dilated.   5. The mitral valve is normal in structure. Moderate to severe mitral  valve regurgitation. No evidence of mitral stenosis.   6. The aortic valve is normal in structure. Aortic valve regurgitation is  not visualized. No aortic stenosis is present.   7. Moderately dilated pulmonary artery.   8. The inferior vena cava is dilated in size with <50% respiratory   variability, suggesting right atrial pressure of 15 mmHg.    Echo 09/2021 1. Left ventricular ejection fraction, by estimation, is <20%. The left  ventricle has severely decreased function. The left ventricle has no  regional wall motion abnormalities. The left ventricular internal cavity  size was moderately dilated. Left  ventricular diastolic  parameters are indeterminate.   2. Right ventricular systolic function is mildly reduced. The right  ventricular size is normal. Tricuspid regurgitation signal is inadequate  for assessing PA pressure.   3. Left atrial size was moderately dilated.   4. Right atrial size was mildly dilated.   5. The mitral valve is normal in structure. Moderate mitral valve  regurgitation. No evidence of mitral stenosis.   6. The aortic valve is normal in structure. Aortic valve regurgitation is  not visualized. Aortic valve sclerosis/calcification is present, without  any evidence of aortic stenosis.   7. The inferior vena cava is dilated in size with <50% respiratory  variability, suggesting right atrial pressure of 15 mmHg.     Patient Profile     57 y.o. male with a h/o NICM, CAD, CKD stage 4, PAF on Eliquis, HL, DM2, ETOH use, stroke, hypothyroidism, GIB, OSA, anemia who is being seen for CHF.    Assessment & Plan    HFrEF NICM LVEF less than 20% -multiple admission over the last 3 months for CHF - presented with SOB and abdominal distention and vomiting x 2. - BNP 2,109 - PTA Invokana '100mg'$  daily, metolazone 2.'5mg'$  daily and Torsemide '60mg'$  daily>held - IV lasix '40mg'$  BID>held 3/3 for worsening kidney function - no ACE/ARB/ARNI/MRA due to CKD - Echo 11/2021 showed LVEF <20% - patient is not ICD candidate due to noncompliance. He follows with Advanced heart failure team - RHC showed severely elevated left and right heart filling pressures and patient was transferred to the ICU for milrinone 0.80mg/min and IV lasix '80mg'$  BID - good UOP net -5L.  Continue with diuresis. Kidney function is stable   RUQ discomfort - bili elevated - lipitor and amiodarone stopped - [place]Q showed possible hepatic cirrhosis - GI planning on EGD   Afib - he has had cardioversion in the past - continue Coreg for rate control - plan was for amio load with DCCV, however this was canceled due to admission - amiodarone held - Eliquis stopped and started on IV heparin for procedure   Elevated troponin CAD - suspect supply demand mismatch in the setting of poor LV function and ongoing medical problems - minimal nonobstructive CAD by cath in 2016 - statin held as above   CKD stage 4 - Scr 3.5 on admission - Baseline 2.8-3.4 - daily BMET with diuresis    For questions or updates, please contact CLivingston ManorPlease consult [email or web address] for contact info under        Signed, Corley Kohls HNinfa Meeker PA-C  04/29/2022, 8:50 AM

## 2022-04-29 NOTE — Progress Notes (Signed)
Progress Note   Patient: Hector [name] [id number] DOB: [birth year] DOA: 04/25/2022     3 DOS: the patient was seen and examined on 04/29/2022   Brief hospital course: Taken from H&P.   Hector [name] is a 57 y.o. male with medical history significant for A. fib on Eliquis, pancreatitis, sCHF with EF < 20%, CKD-IV, HTN, HLD, stroke, hypothyroidism, alcohol abuse in remission, GI bleeding, OSA, anemia, hospitalized 2 months ago with RSV who presents to the ED with cough and shortness of breath for the past 2 days as well as abdominal distention.  ED course and data review: BP 141/126 with heart rate in the low 100s.  Respirations 25-26 with O2 sat on room mid to high 90s.  Labs notable for troponin of 48, BNP pending.  CBC with normal BBC count platelets 1 38,000., respiratory viral panel negative.  Creatinine near baseline at 3.5, potassium 6.2, sodium 132.  Hepatic function panel with total bilirubin 4.8. EKG,.Reviewed and interpreted showing sinus at 106 with nonspecific ST-T wave changes chest x-ray shows.Findings suspicious for developing pulmonary edema. Patient treated with IV Lasix for CHF, given a dose of calcium gluconate and Veltassa for hyperkalemia.  3/3: Vitals stable.  Hyperkalemia has been resolved with potassium of 4.6, Veltassa discontinued.  Patient was on potassium supplement at home which should be discontinued. BNP elevated at 736, patient also has CKD stage IV.  Troponin mildly positive with a flat curve.  RUQ ultrasound with cholelithiasis and gallbladder sludge without complicating factors.  Findings also suspicious for hepatic cirrhosis.  Patient quit drinking 13 years ago. Worsening T. bili and INR.  Concern of cardiohepatic and cardiorenal. Clinically appears euvolemic. Cardiology and nephrology were consulted.  Addendum.  Cardiology is advising holding amiodarone, increasing the dose of carvedilol and switching Eliquis with heparin.  Patient might need right  heart cath to see the pressures.  They were also recommending involving GI as his liver abnormalities does not really match with hepatic congestion.  Message sent to Dr.Vanga.  3/4: Hemodynamically stable.  Worsening renal function, nephrology is advising holding diuresis.  Clinically appears euvolemic but significant worsening of BNP in 1 day.  CK has been normalized.  T. bili seems stable with slight worsening of INR to 2. Patient might need milrinone for concern of low output heart failure, might need to go under advanced heart failure team, cardiology to decide regarding transfer.  3/5; vitals stable.  Renal function continued to get worse.  Sodium at 128 most likely due to worsening renal function.  Going for right heart catheterization for further investigation of his low output decompensated heart failure.  GI also recommended EGD to rule out any peptic ulcer disease causing his epigastric pain and if that is negative they were recommending general surgery evaluation for cholecystectomy as prior HIDA scan done last year with low EF.  They also started him on Creon for history of atrophic pancreas.  3/6: Right heart cath with low output heart failure and severely elevated pressures.  Patient was transferred to ICU and started on milrinone with IV diuresis.  Renal functions currently stable, net negative of -4 L over the past 24 hours.  Abdominal pain seems improving  Patient is very high risk for deterioration and mortality based on advance heart failure, worsening renal function and other underlying comorbidities.  Assessment and Plan: * Acute on chronic systolic CHF (congestive heart failure) (HCC) Nonischemic cardiomyopathy, EF less than 20% not previously a candidate for ICD due to noncompliance  Patient presents with shortness of breath, abdominal distention, with chest x-ray suspicious for developing pulmonary edema Troponin elevated to 48 but patient denies chest pain and EKG nonacute.   ACS not suspected Suspect medication noncompliance based on fill history for medication Holding Lasix and metolazone with worsening renal function. Continue carvedilol  Daily weights with intake and output monitoring Last echo October 2023 showed EF less than 20% Patient was previously deemed not to be ICD insertion by EP due to noncompliance  Patient was last seen by his cardiologist Dr. Haroldine Laws in December 2023 at which time metolazone was added to his diuretic regimen, weight was 247 pounds at the time Right heart catheterization on 3/5 with low output heart failure and severely elevated pressures.  Patient was transferred to ICU and started on milrinone with IV diuresis. -Daily BMP and weight -Strict intake and output -Beta-blocker was held due to low output heart failure  Nonischemic cardiomyopathy: EF 20-25% Please see above  Hyperkalemia Potassium was 6.2 on admission which has been improved. Received calcium gluconate and Veltassa in the ED Patient was on potassium supplement at home which need to be discontinued. Worsening renal function can be contributory. -Discontinuing Veltassa and keep monitoring  Atrial fibrillation with RVR (HCC) Heart rate in low 100.  Has had DC cardioversion in the past Continue amiodarone and carvedilol Continue apixaban for primary stroke prevention  Hyponatremia Clinically appears euvolemic with elevated BNP based on renal function. Can be due to diuretic use.  Sodium at 128 today which is little worsened then 132 -Continue to monitor  Chronic renal impairment, stage 4 (severe) (HCC) AKI with history of CKD stage IV.  Creatinine at 3.61 today with baseline around 3 -Currently being diuresed with milrinone and Lasix -Concern of cardiorenal -Nephrology consult  Hypothyroidism Continue levothyroxine  Type II diabetes mellitus with renal manifestations ([place]) Hold Invokana due to worsening renal function Sliding scale insulin  coverage  Hyperbilirubinemia Bilirubin chronically elevated around 2.5 but now 4.8>>5.4 . INR 1.7 suspect related to hepatic congestion from CHF Had a HIDA scan July 2023 for bilirubin around 3 that showed "Abnormally low gallbladder ejection fraction equal to 6% indicative of decreased gallbladder emptying and gallbladder dysfunction" Renal ultrasound with concern of cirrhosis. GI was consulted and patient might need EGD and if that is normal they were recommending surgical evaluation.  Thrombocytopenia (HCC) Mild thrombocytopenia which seems chronic.  -Continue to monitor  Alcohol use, unspecified, in remission Patient does not currently drink, per patient he quit drinking 13 years ago  History of GI bleed No acute concerns    Subjective: Patient with no new concerns today.  Abdominal pain seems improving.  Brother at bedside.  Physical Exam: Vitals:   04/29/22 1200 04/29/22 1300 04/29/22 1400 04/29/22 1500  BP: 99/87 1'13/85 97/78 97/79 '$  Pulse: 63 92 (!) 52 60  Resp: (!) 21 20 (!) 25 (!) 21  Temp:  98.2 F (36.8 C)  98.4 F (36.9 C)  TempSrc:  Oral  Oral  SpO2: 99% 100% (!) 88% 93%  Weight:      Height:       General.  Ill-appearing gentleman, in no acute distress. Pulmonary.  Lungs clear bilaterally, normal respiratory effort. CV.  Regular rate and rhythm, no JVD, rub or murmur. Abdomen.  Soft, nontender, nondistended, BS positive. CNS.  Alert and oriented .  No focal neurologic deficit. Extremities.  No edema, no cyanosis, pulses intact and symmetrical. Psychiatry.  Judgment and insight appears normal.   Data Reviewed:  Prior data reviewed  Family Communication: Brother at bedside  Disposition: Status is: Inpatient Remains inpatient appropriate because: Severity of illness  Planned Discharge Destination: Home  DVT prophylaxis.  Heparin Time spent: 50 minutes  This record has been created using Systems analyst. Errors have been sought and  corrected,but may not always be located. Such creation errors do not reflect on the standard of care.   Author: Lorella Nimrod, MD 04/29/2022 3:55 PM  For on call review [email or web address].

## 2022-04-29 NOTE — Progress Notes (Signed)
ANTICOAGULATION CONSULT NOTE  Pharmacy Consult for IV heparin Indication: atrial fibrillation  Allergies  Allergen Reactions   Esomeprazole Magnesium Other (See Comments)    Suspected interstitial nephritis 2018   Eggs Or Egg-Derived Products Rash    Patient Measurements: Height: '5\' 11"'$  (180.3 cm) Weight: 107.2 kg (236 lb 5.3 oz) IBW/kg (Calculated) : 75.3 Heparin Dosing Weight: 99.2 kg  Vital Signs: BP: 102/70 (03/06 0600) Pulse Rate: 41 (03/06 0600)  Labs: Recent Labs    04/26/22 1646 04/26/22 1839 04/27/22 0259 04/27/22 1321 04/28/22 0552 04/28/22 0712 04/29/22 0102 04/29/22 0357 04/29/22 1028  HGB 11.9*  --  11.8*  --   --   --   --  11.6*  --   HCT 36.9*  --  36.2*  --   --   --   --  36.1*  --   PLT 223  --  230  --   --   --   --  194  --   APTT 41*  --  139*   < >  --  123* 54*  --  78*  LABPROT 22.1*  --   --   --   --   --   --   --   --   INR 2.0*  --   --   --   --   --   --   --   --   HEPARINUNFRC  --    < > >1.10*  --   --  0.95*  --   --  0.46  CREATININE  --   --  3.61*  --  3.69*  --   --  3.61*  --   CKTOTAL 291  --   --   --   --   --   --   --   --    < > = values in this interval not displayed.    Estimated Creatinine Clearance: 28.1 mL/min (A) (by C-G formula based on SCr of 3.61 mg/dL (H)).  Medical History: Past Medical History:  Diagnosis Date   Alcohol abuse    Alcoholic cardiomyopathy ([place])    a. 12/2007 MV: EF 28%, no isch;  b. 8/12 Echo: EF 25-35%; c. 02/2014 Echo: EF 20-25%; d. 12/2014 Cath: minimal CAD; e. 01/2015 Echo: EF 50-55%;  d. 05/2016 Echo: EF 30-35%, diff HK, gr2 DD; e. 11/2016 Echo: EF 45-50%, diff HK; f. 09/2021 Echo: EF <20%; g. 11/2021 Echo: EF<20%.   Chronic combined systolic (congestive) and diastolic (congestive) heart failure ([place])    a. 05/2016 Echo: EF 30-35%; b. 11/2016 Echo: EF 45-50%, diff HK; c. 09/2021 Echo: EF < 20%; d. 11/2021 Echo: EF <20%, no rwma, Nl RV fxn, sev dil LA, mod-sev MR. mod dil PA w/ mildly elev  PASP.   CKD (chronic kidney disease), stage III (HCC)    Elevated troponin (chronic)    Essential hypertension    GI bleed 11/2013   Hyperlipidemia    Pancreatitis    Paroxysmal A-fib (HCC)    a. new onset s/p unsuccessful TEE/DCCV on 08/16/2014; b. CHA2DS2VASc = 3-> eliquis (freq noncompliant); c. 02/2021 s/p TEE/DCCV; d. Prev on amio->d/c 2/2 abnl TFTs.   Paroxysmal atrial flutter (HCC)    Sleep-disordered breathing    Has yet to have a sleep study   Stroke [facility])     Medications:  Apixaban 5 mg BID PTA  Assessment: 57 year old male being admitted with acute on chronic systolic CHF. PMH includes  Afib on Eliquis prior to admission, pancreatitis, sCHF with EF < 20%, CKD-IV, HTN, HLD, stroke, hypothyroidism, alcohol abuse in remission, GI bleeding, OSA, anemia, hospitalized 2 months ago with RSV.  Pharmacy has been consulted to start IV heparin due to possible procedure H&H stable.   Last dose Eliquis 3/3 AM '@0905'$  (> 8 hours since last dose)  Goal of Therapy:  aPTT 66-102 seconds HL goal 0.3-0.7   Monitor platelets by anticoagulation protocol: Yes   Results: 3/4 @ 0259:  aPTT = 139,  HL = > 1.10 3/4 '@1321'$  aPTT = 79, Therapeutic x 1, heparin rate 1200 units/hr 3/4 @ 2142 aPTT = 54, subtherapeutic 3/5 @ 0712 aPTT = 123, HL = 0.95, SUPRAtherapeutic, rate 1500 units/hr 3/6 @ 0102 aPTT = 54, subtherapeutic 3/6 @ 1028 aPTT = 78, HL = 0.46, therapeutic and correlating  Plan:  Continue heparin at current rate of 1400 un/hr Will switch to monitoring HL given that HL and aPTT are correlating Recheck HL in 8 hrs CTM H&H and PLT daily while on heparin  Will M. Ouida Sills, PharmD PGY-1 Pharmacy Resident 04/29/2022 11:32 AM

## 2022-04-29 NOTE — Progress Notes (Signed)
ANTICOAGULATION CONSULT NOTE  Pharmacy Consult for IV heparin Indication: atrial fibrillation  Allergies  Allergen Reactions   Esomeprazole Magnesium Other (See Comments)    Suspected interstitial nephritis 2018   Eggs Or Egg-Derived Products Rash    Patient Measurements: Height: '5\' 11"'$  (180.3 cm) Weight: 109.3 kg (240 lb 15.4 oz) IBW/kg (Calculated) : 75.3 Heparin Dosing Weight: 99.2 kg  Vital Signs: Temp: 98.1 F (36.7 C) (03/05 2001) Temp Source: Oral (03/05 2001) BP: 106/82 (03/06 0107) Pulse Rate: 36 (03/06 0107)  Labs: Recent Labs    04/26/22 0824 04/26/22 1646 04/26/22 1646 04/26/22 1839 04/27/22 0259 04/27/22 1321 04/27/22 2142 04/28/22 0552 04/28/22 0712 04/29/22 0102  HGB  --  11.9*  --   --  11.8*  --   --   --   --   --   HCT  --  36.9*  --   --  36.2*  --   --   --   --   --   PLT  --  223  --   --  230  --   --   --   --   --   APTT  --  41*   < >  --  139*   < > 54*  --  123* 54*  LABPROT 19.8* 22.1*  --   --   --   --   --   --   --   --   INR 1.7* 2.0*  --   --   --   --   --   --   --   --   HEPARINUNFRC  --   --   --  >1.10* >1.10*  --   --   --  0.95*  --   CREATININE 3.38*  --   --   --  3.61*  --   --  3.69*  --   --   CKTOTAL  --  291  --   --   --   --   --   --   --   --    < > = values in this interval not displayed.    Estimated Creatinine Clearance: 27.8 mL/min (A) (by C-G formula based on SCr of 3.69 mg/dL (H)).  Medical History: Past Medical History:  Diagnosis Date   Alcohol abuse    Alcoholic cardiomyopathy ([place])    a. 12/2007 MV: EF 28%, no isch;  b. 8/12 Echo: EF 25-35%; c. 02/2014 Echo: EF 20-25%; d. 12/2014 Cath: minimal CAD; e. 01/2015 Echo: EF 50-55%;  d. 05/2016 Echo: EF 30-35%, diff HK, gr2 DD; e. 11/2016 Echo: EF 45-50%, diff HK; f. 09/2021 Echo: EF <20%; g. 11/2021 Echo: EF<20%.   Chronic combined systolic (congestive) and diastolic (congestive) heart failure ([place])    a. 05/2016 Echo: EF 30-35%; b. 11/2016 Echo: EF  45-50%, diff HK; c. 09/2021 Echo: EF < 20%; d. 11/2021 Echo: EF <20%, no rwma, Nl RV fxn, sev dil LA, mod-sev MR. mod dil PA w/ mildly elev PASP.   CKD (chronic kidney disease), stage III (HCC)    Elevated troponin (chronic)    Essential hypertension    GI bleed 11/2013   Hyperlipidemia    Pancreatitis    Paroxysmal A-fib (HCC)    a. new onset s/p unsuccessful TEE/DCCV on 08/16/2014; b. CHA2DS2VASc = 3-> eliquis (freq noncompliant); c. 02/2021 s/p TEE/DCCV; d. Prev on amio->d/c 2/2 abnl TFTs.   Paroxysmal atrial flutter ([place])  Sleep-disordered breathing    Has yet to have a sleep study   Stroke [facility])     Medications:  Apixaban 5 mg BID PTA  Assessment: 57 year old male being admitted with acute on chronic systolic CHF. PMH includes Afib on Eliquis prior to admission, pancreatitis, sCHF with EF < 20%, CKD-IV, HTN, HLD, stroke, hypothyroidism, alcohol abuse in remission, GI bleeding, OSA, anemia, hospitalized 2 months ago with RSV.  Pharmacy has been consulted to start IV heparin due to possible procedure H&H stable.   Last dose Eliquis 3/3 AM '@0905'$  (> 8 hours since last dose)  Goal of Therapy:  aPTT 66-102 seconds HL goal 0.3-0.7   Monitor platelets by anticoagulation protocol: Yes   Results: 3/4 @ 0259:  aPTT = 139,  HL = > 1.10 3/4 '@1321'$  aPTT = 79, Therapeutic x 1, heparin rate 1200 units/hr 3/4 @ 2142 aPTT 54, subtherapeutic 3/5 @ 0712 aPTT = 123, HL = 0.95, SUPRAtherapeutic, rate 1500 units/hr 3/6 0102 aPTT 54, subtherapeutic   Plan:  Given aPTT supratherapeutic at previous rate of 1500, will increase heparin infusion to 1400 units/hr. Recheck aPTT  8 hrs after rate change Use aPTT to dose heparin until correlating with HL. Continue to monitor daily H&H and platelets while infusion  Renda Rolls, PharmD, [facility] 04/29/2022 1:53 AM

## 2022-04-29 NOTE — Assessment & Plan Note (Addendum)
Potassium was 6.2 on admission which has been improved. Received calcium gluconate and V[place] in the ED Patient was on potassium supplement at home which need to be discontinued. Worsening renal function can be contributory.

## 2022-04-29 NOTE — Progress Notes (Signed)
[facility]  ROUNDING NOTE   Subjective:   Hector [name] is a 57 y.o. male with past medical conditions including hypertension, paroxysmal A-fib on Eliquis, CHF with a EF less than 20%, and chronic kidney disease stage IV. Patient presents to the emergency department with lower extremity edema and shortness of breath. He has been admitted for Hyperkalemia [E87.5] CHF exacerbation (HCC) [I50.9] Stage 4 chronic kidney disease ([place]) [N18.4] Acute on chronic congestive heart failure, unspecified heart failure type [facility]) [I50.9]  Patient is known to our practice from previous hospital admissions and is followed outpatient by [facility] nephrology.    Right heart catheterization yesterday finding reduced LV systolic function. Patient was transferred to ICU and started on milrinone gtt.   Patient's brother at bedside.    Objective:  Vital signs in last 24 hours:  Temp:  [97.5 F (36.4 C)-98.1 F (36.7 C)] 98.1 F (36.7 C) (03/05 2001) Pulse Rate:  [27-108] 41 (03/06 0600) Resp:  [15-28] 24 (03/06 0600) BP: (76-129)/(60-110) 102/70 (03/06 0600) SpO2:  [65 %-100 %] 98 % (03/06 0600) Weight:  [107.2 kg-109.3 kg] 107.2 kg (03/06 0500)  Weight change:  Filed Weights   04/28/22 0800 04/28/22 1730 04/29/22 0500  Weight: 109 kg 109.3 kg 107.2 kg    Intake/Output: I/O last 3 completed shifts: In: 909.9 [P.O.:240; I.V.:669.9] Out: [id number] [Urine:4670]   Intake/Output this shift:  No intake/output data recorded.  Physical Exam: General: NAD  Head: Normocephalic, atraumatic. Moist oral mucosal membranes  Eyes: Anicteric  Lungs:  Clear to auscultation, normal effort  Heart: irregular  Abdomen:  Soft, nontender, nondistended  Extremities: No peripheral edema.  Neurologic: Nonfocal, moving all four extremities  Skin: No lesions  Access: None    Basic Metabolic Panel: Recent Labs  Lab 04/25/22 1821 04/25/22 2328 04/26/22 [id number] 04/26/22 0824 04/27/22 0259  04/28/22 0552 04/29/22 0357  NA 132*  --   --  132* 132* 128* 134*  K 6.2*   < > 4.6 4.5 4.1 3.8 3.6  CL 97*  --   --  99 96* 94* 97*  CO2 25  --   --  18* '22 26 25  '$ GLUCOSE 123*  --   --  103* 109* 115* 169*  BUN 60*  --   --  63* 67* 69* 69*  CREATININE 3.15*  --   --  3.38* 3.61* 3.69* 3.61*  CALCIUM 8.6*  --   --  8.8* 8.3* 8.1* 8.2*  MG 2.5*  --   --   --   --   --  2.5*   < > = values in this interval not displayed.     Liver Function Tests: Recent Labs  Lab 04/25/22 1821 04/26/22 0824 04/27/22 0259 04/28/22 0552  AST 33 '23 23 21  '$ ALT '13 16 18 20  '$ ALKPHOS 94 93 88 93  BILITOT 4.8* 5.4* 5.1* 4.6*  PROT 7.7 6.8 6.5 6.5  ALBUMIN 3.5 3.5 3.1* 3.1*    Recent Labs  Lab 04/25/22 1821  LIPASE 24    No results for input(s): "AMMONIA" in the last 168 hours.  CBC: Recent Labs  Lab 04/25/22 1821 04/26/22 1646 04/27/22 0259 04/29/22 0357  WBC 8.0 7.8 8.2 8.3  HGB 12.7* 11.9* 11.8* 11.6*  HCT 40.7 36.9* 36.2* 36.1*  MCV 87.2 83.9 84.2 84.9  PLT 138* 223 230 194     Cardiac Enzymes: Recent Labs  Lab 04/26/22 1646  CKTOTAL 291     BNP: Invalid input(s): "POCBNP"  CBG: No results for input(s): "GLUCAP" in the last 168 hours.  Microbiology: Results for orders placed or performed during the hospital encounter of 04/25/22  Resp panel by RT-PCR (RSV, Flu A&B, Covid) Anterior Nasal Swab     Status: None   Collection Time: 04/25/22  8:39 PM   Specimen: Anterior Nasal Swab  Result Value Ref Range Status   SARS Coronavirus 2 by RT PCR NEGATIVE NEGATIVE Final    Comment: (NOTE) SARS-CoV-2 target nucleic acids are NOT DETECTED.  The SARS-CoV-2 RNA is generally detectable in upper respiratory specimens during the acute phase of infection. The lowest concentration of SARS-CoV-2 viral copies this assay can detect is 138 copies/mL. A negative result does not preclude SARS-Cov-2 infection and should not be used as the sole basis for treatment or other patient  management decisions. A negative result may occur with  improper specimen collection/handling, submission of specimen other than nasopharyngeal swab, presence of viral mutation(s) within the areas targeted by this assay, and inadequate number of viral copies(<138 copies/mL). A negative result must be combined with clinical observations, patient history, and epidemiological information. The expected result is Negative.  Fact Sheet for Patients:  EntrepreneurPulse.com.au  Fact Sheet for Healthcare Providers:  IncredibleEmployment.be  This test is no t yet approved or cleared by the [place] FDA and  has been authorized for detection and/or diagnosis of SARS-CoV-2 by FDA under an Emergency Use Authorization (EUA). This EUA will remain  in effect (meaning this test can be used) for the duration of the COVID-19 declaration under Section 564(b)(1) of the Act, 21 U.S.C.section 360bbb-3(b)(1), unless the authorization is terminated  or revoked sooner.       Influenza A by PCR NEGATIVE NEGATIVE Final   Influenza B by PCR NEGATIVE NEGATIVE Final    Comment: (NOTE) The Xpert Xpress SARS-CoV-2/FLU/RSV plus assay is intended as an aid in the diagnosis of influenza from Nasopharyngeal swab specimens and should not be used as a sole basis for treatment. Nasal washings and aspirates are unacceptable for Xpert Xpress SARS-CoV-2/FLU/RSV testing.  Fact Sheet for Patients: EntrepreneurPulse.com.au  Fact Sheet for Healthcare Providers: IncredibleEmployment.be  This test is not yet approved or cleared by the [place] FDA and has been authorized for detection and/or diagnosis of SARS-CoV-2 by FDA under an Emergency Use Authorization (EUA). This EUA will remain in effect (meaning this test can be used) for the duration of the COVID-19 declaration under Section 564(b)(1) of the Act, 21 U.S.C. section 360bbb-3(b)(1),  unless the authorization is terminated or revoked.     Resp Syncytial Virus by PCR NEGATIVE NEGATIVE Final    Comment: (NOTE) Fact Sheet for Patients: EntrepreneurPulse.com.au  Fact Sheet for Healthcare Providers: IncredibleEmployment.be  This test is not yet approved or cleared by the [place] FDA and has been authorized for detection and/or diagnosis of SARS-CoV-2 by FDA under an Emergency Use Authorization (EUA). This EUA will remain in effect (meaning this test can be used) for the duration of the COVID-19 declaration under Section 564(b)(1) of the Act, 21 U.S.C. section 360bbb-3(b)(1), unless the authorization is terminated or revoked.  Performed at [facility], [address] [place], [place] [address]   MRSA Next Gen by PCR, Nasal     Status: Abnormal   Collection Time: 04/28/22  5:37 PM   Specimen: Nasal Mucosa; Nasal Swab  Result Value Ref Range Status   MRSA by PCR Next Gen DETECTED (A) NOT DETECTED Final    Comment: CRITICAL RESULT CALLED TO, READ  BACK BY AND VERIFIED WITH: RN MARCEL TURNER ON 04/28/22 '@1940'$  RP (NOTE) The GeneXpert MRSA Assay (FDA approved for NASAL specimens only), is one component of a comprehensive MRSA colonization surveillance program. It is not intended to diagnose MRSA infection nor to guide or monitor treatment for MRSA infections. Test performance is not FDA approved in patients less than 39 years old. Performed at [facility], [address] [place], [place] [address]     Coagulation Studies: Recent Labs    04/26/22 1646  LABPROT 22.1*  INR 2.0*     Urinalysis: No results for input(s): "COLORURINE", "LABSPEC", "PHURINE", "GLUCOSEU", "HGBUR", "BILIRUBINUR", "KETONESUR", "PROTEINUR", "UROBILINOGEN", "NITRITE", "LEUKOCYTESUR" in the last 72 hours.  Invalid input(s): "APPERANCEUR"    Imaging: DG Chest Port 1 View  Result Date: 04/28/2022 CLINICAL DATA:  Central  venous catheter placement. EXAM: PORTABLE CHEST 1 VIEW COMPARISON:  Chest radiograph dated 04/25/2022. FINDINGS: Right IJ central venous line with tip at the cavoatrial junction. There is cardiomegaly with vascular congestion and edema, progressed since the prior radiograph. Superimposed pneumonia is not excluded. Small bilateral pleural effusions suspected. No pneumothorax. No acute osseous pathology. IMPRESSION: 1. Right IJ central venous line with tip at the cavoatrial junction. 2. Cardiomegaly with vascular congestion and edema. Electronically Signed   By: Anner Crete M.D.   On: 04/28/2022 17:15   CARDIAC CATHETERIZATION  Result Date: 04/28/2022 Conclusions: Severely elevated left and right heart filling pressures. Low Fick cardiac output/index. Successful placement of triple-lumen central venous catheter via the right internal jugular vein. Recommendations: Transfer to stepdown. Initiate milrinone 0.25 mcg/kg/min, with titration based on urine output and cooximetry. Hold carvedilol in the setting of low-output heart failure. Restart furosemide 80 mg IV BID; titrate as needed for net negative fluid balance of at least 2 L per 24 hours. Nelva Bush, MD [facility]    Medications:    sodium chloride     heparin 1,400 Units/hr (04/29/22 0228)   milrinone 0.125 mcg/kg/min (04/29/22 1026)    Chlorhexidine Gluconate Cloth  6 each Topical Daily   furosemide  80 mg Intravenous BID   insulin aspart  0-5 Units Subcutaneous QHS   insulin aspart  0-9 Units Subcutaneous TID WC   levothyroxine  100 mcg Oral Q0600   lipase/protease/amylase  36,000 Units Oral TID WC   mupirocin ointment  1 Application Nasal BID   polyethylene glycol  34 g Oral BID   sodium chloride flush  3 mL Intravenous Q12H   sodium chloride flush  3 mL Intravenous Q12H   spironolactone  25 mg Oral Daily   sodium chloride, acetaminophen **OR** acetaminophen, ondansetron **OR** ondansetron (ZOFRAN) IV, oxyCODONE, sodium  chloride flush  Assessment/ Plan:  Hector [name] is a 57 y.o. black male with past medical conditions including hypertension, paroxysmal A-fib on Eliquis, CHF with a EF less than 20%, and chronic kidney disease stage IV followed by [facility]. Patient presents to the emergency department with lower extremity edema and shortness of breath. He has been admitted for Hyperkalemia [E87.5] CHF exacerbation (HCC) [I50.9] Stage 4 chronic kidney disease ([place]) [N18.4] Acute on chronic congestive heart failure, unspecified heart failure type ([place]) [I50.9]  Acute Kidney Injury with hyponatremia on chronic kidney disease stage 4 with baseline creatinine 2.83 and GFR of 25 on 03/02/22.  Acute kidney injury secondary to acute cardiorenal syndrome.  Chronic kidney disease is secondary to hypertension.   - continue furosemide - start spironolactone - holding canagliflozin due to acute kidney injury -  holding metolazone  Lab Results  Component Value Date   CREATININE 3.61 (H) 04/29/2022   CREATININE 3.69 (H) 04/28/2022   CREATININE 3.61 (H) 04/27/2022    Intake/Output Summary (Last 24 hours) at 04/29/2022 1133 Last data filed at 04/29/2022 0300 Gross per 24 hour  Intake 348.65 ml  Output 3800 ml  Net -3451.35 ml    2. Anemia of chronic kidney disease Lab Results  Component Value Date   HGB 11.6 (L) 04/29/2022    Hgb at goal. Will monitor.   3. Acute on chronic systolic and diastolic heart failure. ECHO from 12/08/2021 shows EF less than 20% with a moderate to severe MVR. Appreciate cardiology input. Now on milrinone gtt.   - Diuretics as above.     LOS: 3 Keaghan Bowens 3/6/202411:33 AM

## 2022-04-30 DIAGNOSIS — N184 Chronic kidney disease, stage 4 (severe): Secondary | ICD-10-CM | POA: Diagnosis not present

## 2022-04-30 DIAGNOSIS — I4891 Unspecified atrial fibrillation: Secondary | ICD-10-CM | POA: Diagnosis not present

## 2022-04-30 DIAGNOSIS — I5023 Acute on chronic systolic (congestive) heart failure: Secondary | ICD-10-CM | POA: Diagnosis not present

## 2022-04-30 DIAGNOSIS — I428 Other cardiomyopathies: Secondary | ICD-10-CM | POA: Diagnosis not present

## 2022-04-30 LAB — CBC
HCT: 37.3 % — ABNORMAL LOW (ref 39.0–52.0)
Hemoglobin: 12.2 g/dL — ABNORMAL LOW (ref 13.0–17.0)
MCH: 27.7 pg (ref 26.0–34.0)
MCHC: 32.7 g/dL (ref 30.0–36.0)
MCV: 84.6 fL (ref 80.0–100.0)
Platelets: 209 10*3/uL (ref 150–400)
RBC: 4.41 MIL/uL (ref 4.22–5.81)
RDW: 21.3 % — ABNORMAL HIGH (ref 11.5–15.5)
WBC: 7.8 10*3/uL (ref 4.0–10.5)
nRBC: 0 % (ref 0.0–0.2)

## 2022-04-30 LAB — POCT I-STAT EG7
Acid-Base Excess: 1 mmol/L (ref 0.0–2.0)
Acid-Base Excess: 1 mmol/L (ref 0.0–2.0)
Bicarbonate: 27.2 mmol/L (ref 20.0–28.0)
Bicarbonate: 27.3 mmol/L (ref 20.0–28.0)
Calcium, Ion: 1.11 mmol/L — ABNORMAL LOW (ref 1.15–1.40)
Calcium, Ion: 1.12 mmol/L — ABNORMAL LOW (ref 1.15–1.40)
HCT: 37 % — ABNORMAL LOW (ref 39.0–52.0)
HCT: 38 % — ABNORMAL LOW (ref 39.0–52.0)
Hemoglobin: 12.6 g/dL — ABNORMAL LOW (ref 13.0–17.0)
Hemoglobin: 12.9 g/dL — ABNORMAL LOW (ref 13.0–17.0)
O2 Saturation: 43 %
O2 Saturation: 52 %
Potassium: 3.9 mmol/L (ref 3.5–5.1)
Potassium: 4 mmol/L (ref 3.5–5.1)
Sodium: 134 mmol/L — ABNORMAL LOW (ref 135–145)
Sodium: 134 mmol/L — ABNORMAL LOW (ref 135–145)
TCO2: 29 mmol/L (ref 22–32)
TCO2: 29 mmol/L (ref 22–32)
pCO2, Ven: 49 mmHg (ref 44–60)
pCO2, Ven: 49.5 mmHg (ref 44–60)
pH, Ven: 7.349 (ref 7.25–7.43)
pH, Ven: 7.352 (ref 7.25–7.43)
pO2, Ven: 26 mmHg — CL (ref 32–45)
pO2, Ven: 30 mmHg — CL (ref 32–45)

## 2022-04-30 LAB — COOXEMETRY PANEL
Carboxyhemoglobin: 1.3 % (ref 0.5–1.5)
Methemoglobin: <0.7 % (ref 0.0–1.5)
O2 Saturation: 99.5 %
Total hemoglobin: 12.9 g/dL (ref 12.0–16.0)
Total oxygen content: 97.6 %

## 2022-04-30 LAB — BASIC METABOLIC PANEL
Anion gap: 11 (ref 5–15)
BUN: 66 mg/dL — ABNORMAL HIGH (ref 6–20)
CO2: 27 mmol/L (ref 22–32)
Calcium: 8.7 mg/dL — ABNORMAL LOW (ref 8.9–10.3)
Chloride: 96 mmol/L — ABNORMAL LOW (ref 98–111)
Creatinine, Ser: 3.51 mg/dL — ABNORMAL HIGH (ref 0.61–1.24)
GFR, Estimated: 19 mL/min — ABNORMAL LOW (ref >60)
Glucose, Bld: 110 mg/dL — ABNORMAL HIGH (ref 70–99)
Potassium: 3.8 mmol/L (ref 3.5–5.1)
Sodium: 134 mmol/L — ABNORMAL LOW (ref 135–145)

## 2022-04-30 LAB — HEPATIC FUNCTION PANEL
ALT: 63 U/L — ABNORMAL HIGH (ref 0–44)
AST: 52 U/L — ABNORMAL HIGH (ref 15–41)
Albumin: 3.3 g/dL — ABNORMAL LOW (ref 3.5–5.0)
Alkaline Phosphatase: 111 U/L (ref 38–126)
Bilirubin, Direct: 2.2 mg/dL — ABNORMAL HIGH (ref 0.0–0.2)
Indirect Bilirubin: 2.4 mg/dL — ABNORMAL HIGH (ref 0.3–0.9)
Total Bilirubin: 4.6 mg/dL — ABNORMAL HIGH (ref 0.3–1.2)
Total Protein: 6.9 g/dL (ref 6.5–8.1)

## 2022-04-30 LAB — HEPARIN LEVEL (UNFRACTIONATED): Heparin Unfractionated: 0.31 IU/mL (ref 0.30–0.70)

## 2022-04-30 LAB — MAGNESIUM: Magnesium: 2.2 mg/dL (ref 1.7–2.4)

## 2022-04-30 MED ORDER — ORAL CARE MOUTH RINSE: 15.0000 mL | OROMUCOSAL | Status: DC | PRN

## 2022-04-30 MED ORDER — POTASSIUM CHLORIDE CRYS ER 20 MEQ PO TBCR
20.0000 meq | EXTENDED_RELEASE_TABLET | Freq: Once | ORAL | Status: AC
  Administered 2022-04-30: 20 meq via ORAL
  Filled 2022-04-30: qty 1

## 2022-04-30 MED ORDER — AMIODARONE HCL IN DEXTROSE 360-4.14 MG/200ML-% IV SOLN
30.0000 mg/h | INTRAVENOUS | Status: DC
  Administered 2022-04-30 – 2022-05-03 (×6): 30 mg/h via INTRAVENOUS
  Filled 2022-04-30 (×6): qty 200

## 2022-04-30 NOTE — Consult Note (Signed)
Advanced Heart Failure Team Consult Note   Primary Physician: [name], MD PCP-Cardiologist:  Ida Rogue, MD  Reason for Consultation: CHF  HPI:    Hector [name] is seen today for evaluation of CHF at the request of Dr. Garen Lah.   Hector [name] is a 57 yo male with ETOH use, DM2, morbid obesity, PAF (h/o GIB on apixaban),  OSA on CPAP, CKD stage IV (Scr ~3.3), medication noncompliance.    HF dates back to at least 2009, thought to presumably be nonischemic in the setting of alcohol use, with an EF of 20%.     Echo 2016 EF 20-25%. LHC 12/2014 minimal CAD.    EF initially improved with restoration of NSR but then dropped back down    He underwent TEE/DCCV in 02/2021 at that time showed an EF of less than 20% with severely reduced RV systolic function. He was reevaluated by EP in 04/2021 and felt not to be a good candidate for ICD or ablation given medication noncompliance.   Admitted in 9/23 & 10/23 with ADHF. Limited echo 10/23 EF <  20% with moderate RV dysfunction and moderate to severe MR.  He was discharged from the hospital on 12/12/2021 with a weight of 247 pounds.     Admitted 11/23 with ADHF and diuresed. D/c'd 01/12/22. Readmitted 01/24/22 with recurrent HF. Diuresed and d/c'd on 01/27/22. Weight 244.   Patient readmitted with CHF, volume overload.  RHC showed low output (see below).  PICC placed and patient started on milrinone with IV Lasix.  He has diuresed well, weight down and CVP 5-6 this morning.  Co-ox today not accurate.  Creatinine stable this morning at 3.5.   He remains in atrial fibrillation, amiodarone stopped it appears due to elevated bilirubin.  RUQ [place] with cholelithiasis without cholecystitis. He has had some epigastric pain and was seen by GI, plan for eventual EGD but not urgent.   RHC Mean RA 17 PASP 58/39 Mean PCWP 33 CI 1.5 PAPi 1.2 PVR 2.9 WU    Review of Systems: All systems reviewed and negative except as per HPI.    Home Medications Prior to Admission medications   Medication Sig Start Date End Date Taking? Authorizing Provider  allopurinol (ZYLOPRIM) 100 MG tablet Take 0.5 tablets (50 mg total) by mouth daily. 10/03/21  Yes Danford, Suann Larry, MD  amiodarone (PACERONE) 200 MG tablet Take 1 tablet (200 mg total) by mouth 2 (two) times daily. 02/02/22  Yes Bensimhon, Shaune Pascal, MD  apixaban (ELIQUIS) 5 MG TABS tablet Take 1 tablet (5 mg total) by mouth 2 (two) times daily. 10/02/21  Yes Danford, Suann Larry, MD  atorvastatin (LIPITOR) 80 MG tablet Take 1 tablet (80 mg total) by mouth daily. 11/24/21  Yes Annita Brod, MD  carvedilol (COREG) 6.25 MG tablet Take 1 tablet (6.25 mg total) by mouth 2 (two) times daily with a meal. 10/02/21  Yes Danford, Suann Larry, MD  ferrous gluconate (FERGON) 324 MG tablet Take 324 mg by mouth daily with breakfast. 12/22/21  Yes [provider]  levothyroxine (SYNTHROID) 100 MCG tablet Take 1 tablet (100 mcg total) by mouth daily before breakfast. 11/24/21  Yes Annita Brod, MD  potassium chloride SA (KLOR-CON M) 20 MEQ tablet Take 1 tablet (20 mEq total) by mouth daily. Take an extra 2 tablets on Mon and Fri with metolazone. Patient taking differently: Take 20 mEq by mouth daily. 02/02/22  Yes Bensimhon, Shaune Pascal, MD  torsemide [facility])  20 MG tablet Take 3 tablets (60 mg total) by mouth daily. Patient taking differently: Take 40 mg by mouth daily. 03/05/22  Yes Ezekiel Slocumb, DO  canagliflozin (INVOKANA) 100 MG TABS tablet Take 1 tablet (100 mg total) by mouth daily before breakfast. Patient not taking: Reported on 04/26/2022 04/06/22   Samella Parr, NP  metolazone (ZAROXOLYN) 2.5 MG tablet Take 1 tablet (2.5 mg total) by mouth daily. Patient not taking: Reported on 04/26/2022 02/02/22   Bensimhon, Shaune Pascal, MD  traMADol (ULTRAM) 50 MG tablet Take 1 tablet (50 mg total) by mouth every 6 (six) hours as needed. Patient not taking: Reported on  04/26/2022 03/23/22 03/23/23  Lavonia Drafts, MD    Past Medical History: Past Medical History:  Diagnosis Date   Alcohol abuse    Alcoholic cardiomyopathy ([place])    a. 12/2007 MV: EF 28%, no isch;  b. 8/12 Echo: EF 25-35%; c. 02/2014 Echo: EF 20-25%; d. 12/2014 Cath: minimal CAD; e. 01/2015 Echo: EF 50-55%;  d. 05/2016 Echo: EF 30-35%, diff HK, gr2 DD; e. 11/2016 Echo: EF 45-50%, diff HK; f. 09/2021 Echo: EF <20%; g. 11/2021 Echo: EF<20%.   Chronic combined systolic (congestive) and diastolic (congestive) heart failure ([place])    a. 05/2016 Echo: EF 30-35%; b. 11/2016 Echo: EF 45-50%, diff HK; c. 09/2021 Echo: EF < 20%; d. 11/2021 Echo: EF <20%, no rwma, Nl RV fxn, sev dil LA, mod-sev MR. mod dil PA w/ mildly elev PASP.   CKD (chronic kidney disease), stage III (HCC)    Elevated troponin (chronic)    Essential hypertension    GI bleed 11/2013   Hyperlipidemia    Pancreatitis    Paroxysmal A-fib (HCC)    a. new onset s/p unsuccessful TEE/DCCV on 08/16/2014; b. CHA2DS2VASc = 3-> eliquis (freq noncompliant); c. 02/2021 s/p TEE/DCCV; d. Prev on amio->d/c 2/2 abnl TFTs.   Paroxysmal atrial flutter (HCC)    Sleep-disordered breathing    Has yet to have a sleep study   Stroke [facility])     Past Surgical History: Past Surgical History:  Procedure Laterality Date   CARDIAC CATHETERIZATION N/A 01/09/2015   Procedure: Left Heart Cath and Coronary Angiography;  Surgeon: [name], MD;  Location: [place] [facility];  Service: Cardiovascular;  Laterality: N/A;   CARDIOVERSION N/A 03/05/2021   Procedure: CARDIOVERSION;  Surgeon: Kate Sable, MD;  Location: [facility] ORS;  Service: Cardiovascular;  Laterality: N/A;   CENTRAL LINE INSERTION  04/28/2022   Procedure: CENTRAL LINE INSERTION;  Surgeon: Nelva Bush, MD;  Location: [place] [facility];  Service: Cardiovascular;;   COLONOSCOPY N/A 07/22/2020   Procedure: COLONOSCOPY;  Surgeon: Toledo, Benay Pike, MD;  Location: [facility] ENDOSCOPY;  Service:  Gastroenterology;  Laterality: N/A;   ELECTROPHYSIOLOGIC STUDY N/A 08/16/2014   Procedure: CARDIOVERSION;  Surgeon: [name], MD;  Location: [facility] ORS;  Service: Cardiovascular;  Laterality: N/A;   FLEXIBLE SIGMOIDOSCOPY N/A 10/10/2015   Procedure: FLEXIBLE SIGMOIDOSCOPY;  Surgeon: [name], MD;  Location: [facility];  Service: Endoscopy;  Laterality: N/A;   KNEE SURGERY Right    NM MYOVIEW LTD  November 2011   No ischemia or infarction. EF 50-55% ( no improvement from 2009 Myoview EF of 28%   RIGHT HEART CATH N/A 04/28/2022   Procedure: RIGHT HEART CATH;  Surgeon: Nelva Bush, MD;  Location: [place] [facility];  Service: Cardiovascular;  Laterality: N/A;   TEE WITHOUT CARDIOVERSION N/A 08/16/2014   Procedure: TRANSESOPHAGEAL ECHOCARDIOGRAM (TEE);  Surgeon:  [name], MD;  Location: [facility] ORS;  Service: Cardiovascular;  Laterality: N/A;   TEE WITHOUT CARDIOVERSION N/A 03/05/2021   Procedure: TRANSESOPHAGEAL ECHOCARDIOGRAM (TEE);  Surgeon: Kate Sable, MD;  Location: [facility] ORS;  Service: Cardiovascular;  Laterality: N/A;    Family History: Family History  Problem Relation Age of Onset   Hypertension Mother    Hyperlipidemia Mother    Diabetes Mother     Social History: Social History   Socioeconomic History   Marital status: Single    Spouse name: Not on file   Number of children: Not on file   Years of education: Not on file   Highest education level: Not on file  Occupational History   Not on file  Tobacco Use   Smoking status: Former    Packs/day: 1.00    Years: 12.00    Total pack years: 12.00    Types: Cigarettes    Quit date: 02/23/1998    Years since quitting: 24.1   Smokeless tobacco: Never  Vaping Use   Vaping Use: Never used  Substance and Sexual Activity   Alcohol use: Not Currently    Alcohol/week: 0.0 standard drinks of alcohol    Comment: Past heavy drinker   Drug use: No   Sexual activity: Not Currently  Other Topics  Concern   Not on file  Social History Narrative   Not on file   Social Determinants of Health   Financial Resource Strain: Low Risk  (01/04/2019)   Overall Financial Resource Strain (CARDIA)    Difficulty of Paying Living Expenses: Not hard at all  Food Insecurity: No Food Insecurity (02/25/2022)   Hunger Vital Sign    Worried About Running Out of Food in the Last Year: Never true    Ran Out of Food in the Last Year: Never true  Transportation Needs: No Transportation Needs (02/25/2022)   PRAPARE - Hydrologist (Medical): No    Lack of Transportation (Non-Medical): No  Physical Activity: Inactive (01/04/2019)   Exercise Vital Sign    Days of Exercise per Week: 0 days    Minutes of Exercise per Session: 0 min  Stress: No Stress Concern Present (01/04/2019)   [place]    Feeling of Stress : Not at all  Social Connections: Moderately Isolated (01/04/2019)   Social Connection and Isolation Panel [NHANES]    Frequency of Communication with Friends and Family: More than three times a week    Frequency of Social Gatherings with Friends and Family: More than three times a week    Attends Religious Services: 1 to 4 times per year    Active Member of [employer] or Organizations: No    Attends Archivist Meetings: Never    Marital Status: Never married    Allergies:  Allergies  Allergen Reactions   Esomeprazole Magnesium Other (See Comments)    Suspected interstitial nephritis 2018   Egg-Derived Products Rash    Objective:    Vital Signs:   Temp:  [98.2 F (36.8 C)-98.4 F (36.9 C)] 98.4 F (36.9 C) (03/06 1500) Pulse Rate:  [34-110] 63 (03/07 0600) Resp:  [16-25] 16 (03/07 0600) BP: (97-121)/(71-92) 101/90 (03/07 0600) SpO2:  [88 %-100 %] 99 % (03/07 0600) Last BM Date : 04/28/22  Weight change: Filed Weights   04/28/22 0800 04/28/22 1730 04/29/22 0500  Weight: 109 kg  109.3 kg 107.2 kg    Intake/Output:   Intake/Output  Summary (Last 24 hours) at 04/30/2022 0833 Last data filed at 04/30/2022 0600 Gross per 24 hour  Intake 1719.63 ml  Output 4975 ml  Net -3255.37 ml      Physical Exam    General:  Well appearing. No resp difficulty HEENT: normal Neck: supple. JVP not elevated. Carotids 2+ bilat; no bruits. No lymphadenopathy or thyromegaly appreciated. Cor: PMI nondisplaced. Mildly tachy, irregular rate & rhythm. 2/6 HSM apex.  Lungs: clear Abdomen: soft, nontender, nondistended. No hepatosplenomegaly. No bruits or masses. Good bowel sounds. Extremities: no cyanosis, clubbing, rash, edema Neuro: alert & orientedx3, cranial nerves grossly intact. moves all 4 extremities w/o difficulty. Affect pleasant   Telemetry   Atrial fibrillation 110s (personally reviewed)  EKG    Atrial fibrillation, IVCD, LAFB (personally reviewed)  Labs   Basic Metabolic Panel: Recent Labs  Lab 04/25/22 1821 04/25/22 2328 04/26/22 0824 04/27/22 0259 04/28/22 0552 04/29/22 0357 04/30/22 0425  NA 132*  --  132* 132* 128* 134* 134*  K 6.2*   < > 4.5 4.1 3.8 3.6 3.8  CL 97*  --  99 96* 94* 97* 96*  CO2 25  --  18* '22 26 25 27  '$ GLUCOSE 123*  --  103* 109* 115* 169* 110*  BUN 60*  --  63* 67* 69* 69* 66*  CREATININE 3.15*  --  3.38* 3.61* 3.69* 3.61* 3.51*  CALCIUM 8.6*  --  8.8* 8.3* 8.1* 8.2* 8.7*  MG 2.5*  --   --   --   --  2.5* 2.2   < > = values in this interval not displayed.    Liver Function Tests: Recent Labs  Lab 04/25/22 1821 04/26/22 0824 04/27/22 0259 04/28/22 0552  AST 33 '23 23 21  '$ ALT '13 16 18 20  '$ ALKPHOS 94 93 88 93  BILITOT 4.8* 5.4* 5.1* 4.6*  PROT 7.7 6.8 6.5 6.5  ALBUMIN 3.5 3.5 3.1* 3.1*   Recent Labs  Lab 04/25/22 1821  LIPASE 24   No results for input(s): "AMMONIA" in the last 168 hours.  CBC: Recent Labs  Lab 04/25/22 1821 04/26/22 1646 04/27/22 0259 04/29/22 0357 04/30/22 0425  WBC 8.0 7.8 8.2 8.3 7.8  HGB  12.7* 11.9* 11.8* 11.6* 12.2*  HCT 40.7 36.9* 36.2* 36.1* 37.3*  MCV 87.2 83.9 84.2 84.9 84.6  PLT 138* 223 230 194 209    Cardiac Enzymes: Recent Labs  Lab 04/26/22 1646  CKTOTAL 291    BNP: BNP (last 3 results) Recent Labs    02/23/22 0920 03/23/22 0642 04/26/22 1646  BNP 609.5* 736.5* 2,109.1*    ProBNP (last 3 results) No results for input(s): "PROBNP" in the last 8760 hours.   CBG: Recent Labs  Lab 04/29/22 2147  GLUCAP 101*    Coagulation Studies: No results for input(s): "LABPROT", "INR" in the last 72 hours.   Imaging   No results found.   Medications:     Current Medications:  Chlorhexidine Gluconate Cloth  6 each Topical Daily   insulin aspart  0-5 Units Subcutaneous QHS   insulin aspart  0-9 Units Subcutaneous TID WC   levothyroxine  100 mcg Oral Q0600   lipase/protease/amylase  36,000 Units Oral TID WC   mupirocin ointment  1 Application Nasal BID   polyethylene glycol  34 g Oral BID   potassium chloride  20 mEq Oral Once   sodium chloride flush  3 mL Intravenous Q12H   sodium chloride flush  3 mL Intravenous Q12H   spironolactone  25 mg Oral Daily    Infusions:  sodium chloride     amiodarone     heparin 1,400 Units/hr (04/30/22 0600)   milrinone 0.125 mcg/kg/min (04/30/22 0600)     Assessment/Plan   1. Acute on chronic systolic CHF: Nonischemic cardiomyopathy.  Echo in 10/23 with EF < 20%, mild-moderate LV dilation, mod-severe MR, normal RV.  H/o noncompliance, ETOH abuse thought to be cause of cardiomyopathy.  Cath in 2016 with minimal coronary disease. RHC this admission with significant volume overload and low cardiac index 1.5.  He is currently on milrinone 0.125, no co-ox this morning.  CVP down to 5-6 with good diuresis.  Creatinine stable at 3.51.  - Check co-ox this morning, stop milrinone if 60% or above.  - Had IV Lasix this morning, will stop further IV Lasix with low CVP.  - GDMT limited by soft BP and CKD stage IV.   - Needs conversion to NSR, see below.  2. Atrial fibrillation: Persistent.  Had been planned for DCCV prior to admission but got admitted first.  I suspect elevated tbili is due to CHF rather than amiodarone.  - Restart amiodarone gtt 30 mg/hr.  - Continue heparin gtt, eventually to Eliquis.  - TEE-guided DCCV hopefully tomorrow.  Discussed risks/benefits with patient and he agrees to procedure.  3. CKD stage IV: Creatinine stable so far at 3.5.  4. Elevated LFTs: Tbili elevated.  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  I suspect elevated bilirubin this admission was due to CHF rather than amiodarone.  - Check LFTs today, make sure trending down.   Length of Stay: [place], MD  04/30/2022, 8:33 AM  Advanced Heart Failure Team Pager [phone] (M-F; 7a - 5p)  Please contact [facility] for night-coverage after hours (4p -7a ) and weekends on [email or web address]

## 2022-04-30 NOTE — Progress Notes (Signed)
Progress Note   Patient: Hector [name] [id number] DOB: 25-Apr-1965 DOA: 04/25/2022     4 DOS: the patient was seen and examined on 04/30/2022   Brief hospital course: Taken from H&P.   Hector [name] is a 57 y.o. male with medical history significant for A. fib on Eliquis, pancreatitis, sCHF with EF < 20%, CKD-IV, HTN, HLD, stroke, hypothyroidism, alcohol abuse in remission, GI bleeding, OSA, anemia, hospitalized 2 months ago with RSV who presents to the ED with cough and shortness of breath for the past 2 days as well as abdominal distention.  ED course and data review: BP 141/126 with heart rate in the low 100s.  Respirations 25-26 with O2 sat on room mid to high 90s.  Labs notable for troponin of 48, BNP pending.  CBC with normal BBC count platelets 1 38,000., respiratory viral panel negative.  Creatinine near baseline at 3.5, potassium 6.2, sodium 132.  Hepatic function panel with total bilirubin 4.8. EKG,.Reviewed and interpreted showing sinus at 106 with nonspecific ST-T wave changes chest x-ray shows.Findings suspicious for developing pulmonary edema. Patient treated with IV Lasix for CHF, given a dose of calcium gluconate and Veltassa for hyperkalemia.  3/3: Vitals stable.  Hyperkalemia has been resolved with potassium of 4.6, Veltassa discontinued.  Patient was on potassium supplement at home which should be discontinued. BNP elevated at 736, patient also has CKD stage IV.  Troponin mildly positive with a flat curve.  RUQ ultrasound with cholelithiasis and gallbladder sludge without complicating factors.  Findings also suspicious for hepatic cirrhosis.  Patient quit drinking 13 years ago. Worsening T. bili and INR.  Concern of cardiohepatic and cardiorenal. Clinically appears euvolemic. Cardiology and nephrology were consulted.  Addendum.  Cardiology is advising holding amiodarone, increasing the dose of carvedilol and switching Eliquis with heparin.  Patient might need right  heart cath to see the pressures.  They were also recommending involving GI as his liver abnormalities does not really match with hepatic congestion.  Message sent to Dr.Vanga.  3/4: Hemodynamically stable.  Worsening renal function, nephrology is advising holding diuresis.  Clinically appears euvolemic but significant worsening of BNP in 1 day.  CK has been normalized.  T. bili seems stable with slight worsening of INR to 2. Patient might need milrinone for concern of low output heart failure, might need to go under advanced heart failure team, cardiology to decide regarding transfer.  3/5; vitals stable.  Renal function continued to get worse.  Sodium at 128 most likely due to worsening renal function.  Going for right heart catheterization for further investigation of his low output decompensated heart failure.  GI also recommended EGD to rule out any peptic ulcer disease causing his epigastric pain and if that is negative they were recommending general surgery evaluation for cholecystectomy as prior HIDA scan done last year with low EF.  They also started him on Creon for history of atrophic pancreas.  3/6: Right heart cath with low output heart failure and severely elevated pressures.  Patient was transferred to ICU and started on milrinone with IV diuresis.  Renal functions currently stable, net negative of -4 L over the past 24 hours.  Abdominal pain seems improving.  3/7: Vital stable with borderline blood pressure.  UOP recorded for as above 5 L with total net negative of -8605.  CVP 5-6, second dose of Lasix was held by cardiology.  Renal function seems stable.  Cardiology is planning DCCV tomorrow as he remained in A-fib.  Advanced  heart failure team restarted amiodarone as elevated T. bili and mild transaminitis is most likely due to hepatic congestion with acute on chronic CHF.  Hepatic function today with mild transaminitis with AST of 52, ALT 63 and T. bili stable at 4.6.  Patient is very  high risk for deterioration and mortality based on advance heart failure, worsening renal function and other underlying comorbidities.  Assessment and Plan: * Acute on chronic systolic CHF (congestive heart failure) (HCC) Nonischemic cardiomyopathy, EF less than 20% not previously a candidate for ICD due to noncompliance Patient presents with shortness of breath, abdominal distention, with chest x-ray suspicious for developing pulmonary edema Troponin elevated to 48 but patient denies chest pain and EKG nonacute.  ACS not suspected Suspect medication noncompliance based on fill history for medication Holding Lasix and metolazone with worsening renal function. Continue carvedilol  Daily weights with intake and output monitoring Last echo October 2023 showed EF less than 20% Patient was previously deemed not to be ICD insertion by EP due to noncompliance  Patient was last seen by his cardiologist Dr. Haroldine Laws in December 2023 at which time metolazone was added to his diuretic regimen, weight was 247 pounds at the time Right heart catheterization on 3/5 with low output heart failure and severely elevated pressures.  Patient was transferred to ICU and started on milrinone with IV diuresis. -Daily BMP and weight -Strict intake and output -Beta-blocker was held due to low output heart failure  Nonischemic cardiomyopathy: EF 20-25% Please see above  Hyperkalemia Potassium was 6.2 on admission which has been improved. Received calcium gluconate and Veltassa in the ED Patient was on potassium supplement at home which need to be discontinued. Worsening renal function can be contributory. -Discontinuing Veltassa and keep monitoring  Atrial fibrillation with RVR (HCC) Heart rate in low 100.  Has had DC cardioversion in the past Continue amiodarone and carvedilol Continue apixaban for primary stroke prevention -Another DCCV is planned for tomorrow  Hyponatremia Clinically appears euvolemic  with elevated BNP based on renal function. Can be due to diuretic use.  Sodium at 128 today which is little worsened then 132 -Continue to monitor  Chronic renal impairment, stage 4 (severe) (HCC) AKI with history of CKD stage IV.  Creatinine at 3.61 today with baseline around 3 -Currently being diuresed with milrinone and Lasix -Concern of cardiorenal -Nephrology consult  Hypothyroidism Continue levothyroxine  Type II diabetes mellitus with renal manifestations ([name]) Hold Invokana due to worsening renal function Sliding scale insulin coverage  Hyperbilirubinemia Bilirubin chronically elevated around 2.5 but now 4.8>>5.4 . INR 1.7 suspect related to hepatic congestion from CHF Had a HIDA scan July 2023 for bilirubin around 3 that showed "Abnormally low gallbladder ejection fraction equal to 6% indicative of decreased gallbladder emptying and gallbladder dysfunction" Renal ultrasound with concern of cirrhosis. GI was consulted and patient might need EGD and if that is normal they were recommending surgical evaluation.  Thrombocytopenia (HCC) Mild thrombocytopenia which seems chronic.  -Continue to monitor  Alcohol use, unspecified, in remission Patient does not currently drink, per patient he quit drinking 13 years ago  History of GI bleed No acute concerns    Subjective: Patient was seen and examined today.  Belly pain improving.  Able to tolerate food.  Physical Exam: Vitals:   04/30/22 1000 04/30/22 1100 04/30/22 1200 04/30/22 1300  BP: 104/79 (!) 117/92 102/82 121/85  Pulse: 61 (!) 44 (!) 111 (!) 51  Resp: 20 (!) 27 17 (!) 21  Temp:  98.7 F (37.1 C)   TempSrc:   Oral   SpO2: 93% 96% 97% 97%  Weight:      Height:       General.     In no acute distress. Pulmonary.  Lungs clear bilaterally, normal respiratory effort. CV.  Irregularly irregular Abdomen.  Soft, nontender, nondistended, BS positive. CNS.  Alert and oriented .  No focal neurologic  deficit. Extremities.  No edema, no cyanosis, pulses intact and symmetrical. Psychiatry.  Judgment and insight appears normal.   Data Reviewed: Prior data reviewed  Family Communication: Discussed with patient  Disposition: Status is: Inpatient Remains inpatient appropriate because: Severity of illness  Planned Discharge Destination: Home  DVT prophylaxis.  Heparin Time spent: 45 minutes  This record has been created using Systems analyst. Errors have been sought and corrected,but may not always be located. Such creation errors do not reflect on the standard of care.   Author: Lorella Nimrod, MD 04/30/2022 1:50 PM  For on call review [email or web address].

## 2022-04-30 NOTE — Progress Notes (Signed)
[facility]  ROUNDING NOTE   Subjective:   Hector [name] is a 57 y.o. male with past medical conditions including hypertension, paroxysmal A-fib on Eliquis, CHF with a EF less than 20%, and chronic kidney disease stage IV. Patient presents to the emergency department with lower extremity edema and shortness of breath. He has been admitted for Hyperkalemia [E87.5] CHF exacerbation (HCC) [I50.9] Stage 4 chronic kidney disease ([place]) [N18.4] Acute on chronic congestive heart failure, unspecified heart failure type [facility]) [I50.9]  Patient is known to our practice from previous hospital admissions and is followed outpatient by [facility] nephrology.    UOP 5168m  Creatinine 3.51  Started on spironolactone.  Amiodarone gtt Milrinone gtt Heparin gtt   Objective:  Vital signs in last 24 hours:  Temp:  [98.2 F (36.8 C)-99 F (37.2 C)] 99 F (37.2 C) (03/07 0800) Pulse Rate:  [34-110] 61 (03/07 1000) Resp:  [16-25] 20 (03/07 1000) BP: (97-118)/(76-92) 104/79 (03/07 1000) SpO2:  [88 %-100 %] 93 % (03/07 1000) Weight:  [107.2 kg] 107.2 kg (03/07 0724)  Weight change:  Filed Weights   04/28/22 1730 04/29/22 0500 04/30/22 0724  Weight: 109.3 kg 107.2 kg 107.2 kg    Intake/Output: I/O last 3 completed shifts: In: 1889.6 [P.O.:1320; I.V.:569.6] Out: 8375 [Urine:8375]   Intake/Output this shift:  Total I/O In: 214.1 [P.O.:120; I.V.:94.1] Out: 400 [Urine:400]  Physical Exam: General: NAD, laying in bed  Head: Normocephalic, atraumatic. Moist oral mucosal membranes  Eyes: Anicteric  Lungs:  Clear to auscultation, normal effort  Heart: irregular  Abdomen:  Soft, nontender, nondistended  Extremities: No peripheral edema.  Neurologic: Nonfocal, moving all four extremities  Skin: No lesions  Access: None    Basic Metabolic Panel: Recent Labs  Lab 04/25/22 1821 04/25/22 2328 04/26/22 0824 04/27/22 0259 04/28/22 0552 04/28/22 1605 04/28/22 1616  04/29/22 0357 04/30/22 0425  NA 132*  --  132* 132* 128* 134* 134* 134* 134*  K 6.2*   < > 4.5 4.1 3.8 4.0 3.9 3.6 3.8  CL 97*  --  99 96* 94*  --   --  97* 96*  CO2 25  --  18* 22 26  --   --  25 27  GLUCOSE 123*  --  103* 109* 115*  --   --  169* 110*  BUN 60*  --  63* 67* 69*  --   --  69* 66*  CREATININE 3.15*  --  3.38* 3.61* 3.69*  --   --  3.61* 3.51*  CALCIUM 8.6*  --  8.8* 8.3* 8.1*  --   --  8.2* 8.7*  MG 2.5*  --   --   --   --   --   --  2.5* 2.2   < > = values in this interval not displayed.     Liver Function Tests: Recent Labs  Lab 04/25/22 1821 04/26/22 0824 04/27/22 0259 04/28/22 0552 04/30/22 0425  AST 33 '23 23 21 '$ 52*  ALT '13 16 18 20 '$ 63*  ALKPHOS 94 93 88 93 111  BILITOT 4.8* 5.4* 5.1* 4.6* 4.6*  PROT 7.7 6.8 6.5 6.5 6.9  ALBUMIN 3.5 3.5 3.1* 3.1* 3.3*    Recent Labs  Lab 04/25/22 1821  LIPASE 24    No results for input(s): "AMMONIA" in the last 168 hours.  CBC: Recent Labs  Lab 04/25/22 1821 04/26/22 1646 04/27/22 0259 04/28/22 1605 04/28/22 1616 04/29/22 0357 04/30/22 0425  WBC 8.0 7.8 8.2  --   --  8.3 7.8  HGB 12.7* 11.9* 11.8* 12.9* 12.6* 11.6* 12.2*  HCT 40.7 36.9* 36.2* 38.0* 37.0* 36.1* 37.3*  MCV 87.2 83.9 84.2  --   --  84.9 84.6  PLT 138* 223 230  --   --  194 209     Cardiac Enzymes: Recent Labs  Lab 04/26/22 1646  CKTOTAL 291     BNP: Invalid input(s): "POCBNP"  CBG: Recent Labs  Lab 04/29/22 2147  GLUCAP 101*    Microbiology: Results for orders placed or performed during the hospital encounter of 04/25/22  Resp panel by RT-PCR (RSV, Flu A&B, Covid) Anterior Nasal Swab     Status: None   Collection Time: 04/25/22  8:39 PM   Specimen: Anterior Nasal Swab  Result Value Ref Range Status   SARS Coronavirus 2 by RT PCR NEGATIVE NEGATIVE Final    Comment: (NOTE) SARS-CoV-2 target nucleic acids are NOT DETECTED.  The SARS-CoV-2 RNA is generally detectable in upper respiratory specimens during the acute  phase of infection. The lowest concentration of SARS-CoV-2 viral copies this assay can detect is 138 copies/mL. A negative result does not preclude SARS-Cov-2 infection and should not be used as the sole basis for treatment or other patient management decisions. A negative result may occur with  improper specimen collection/handling, submission of specimen other than nasopharyngeal swab, presence of viral mutation(s) within the areas targeted by this assay, and inadequate number of viral copies(<138 copies/mL). A negative result must be combined with clinical observations, patient history, and epidemiological information. The expected result is Negative.  Fact Sheet for Patients:  EntrepreneurPulse.com.au  Fact Sheet for Healthcare Providers:  IncredibleEmployment.be  This test is no t yet approved or cleared by the [place] FDA and  has been authorized for detection and/or diagnosis of SARS-CoV-2 by FDA under an Emergency Use Authorization (EUA). This EUA will remain  in effect (meaning this test can be used) for the duration of the COVID-19 declaration under Section 564(b)(1) of the Act, 21 U.S.C.section 360bbb-3(b)(1), unless the authorization is terminated  or revoked sooner.       Influenza A by PCR NEGATIVE NEGATIVE Final   Influenza B by PCR NEGATIVE NEGATIVE Final    Comment: (NOTE) The Xpert Xpress SARS-CoV-2/FLU/RSV plus assay is intended as an aid in the diagnosis of influenza from Nasopharyngeal swab specimens and should not be used as a sole basis for treatment. Nasal washings and aspirates are unacceptable for Xpert Xpress SARS-CoV-2/FLU/RSV testing.  Fact Sheet for Patients: EntrepreneurPulse.com.au  Fact Sheet for Healthcare Providers: IncredibleEmployment.be  This test is not yet approved or cleared by the [place] FDA and has been authorized for detection and/or diagnosis of  SARS-CoV-2 by FDA under an Emergency Use Authorization (EUA). This EUA will remain in effect (meaning this test can be used) for the duration of the COVID-19 declaration under Section 564(b)(1) of the Act, 21 U.S.C. section 360bbb-3(b)(1), unless the authorization is terminated or revoked.     Resp Syncytial Virus by PCR NEGATIVE NEGATIVE Final    Comment: (NOTE) Fact Sheet for Patients: EntrepreneurPulse.com.au  Fact Sheet for Healthcare Providers: IncredibleEmployment.be  This test is not yet approved or cleared by the [place] FDA and has been authorized for detection and/or diagnosis of SARS-CoV-2 by FDA under an Emergency Use Authorization (EUA). This EUA will remain in effect (meaning this test can be used) for the duration of the COVID-19 declaration under Section 564(b)(1) of the Act, 21 U.S.C. section 360bbb-3(b)(1), unless the authorization is terminated  or revoked.  Performed at [facility], [address] [place], [place] [address]   MRSA Next Gen by PCR, Nasal     Status: Abnormal   Collection Time: 04/28/22  5:37 PM   Specimen: Nasal Mucosa; Nasal Swab  Result Value Ref Range Status   MRSA by PCR Next Gen DETECTED (A) NOT DETECTED Final    Comment: CRITICAL RESULT CALLED TO, READ BACK BY AND VERIFIED WITH: RN MARCEL TURNER ON 04/28/22 '@1940'$  RP (NOTE) The GeneXpert MRSA Assay (FDA approved for NASAL specimens only), is one component of a comprehensive MRSA colonization surveillance program. It is not intended to diagnose MRSA infection nor to guide or monitor treatment for MRSA infections. Test performance is not FDA approved in patients less than 10 years old. Performed at [facility], [address] [place], [place] [address]     Coagulation Studies: No results for input(s): "LABPROT", "INR" in the last 72 hours.   Urinalysis: No results for input(s): "COLORURINE", "LABSPEC", "PHURINE",  "GLUCOSEU", "HGBUR", "BILIRUBINUR", "KETONESUR", "PROTEINUR", "UROBILINOGEN", "NITRITE", "LEUKOCYTESUR" in the last 72 hours.  Invalid input(s): "APPERANCEUR"    Imaging: DG Chest Port 1 View  Result Date: 04/28/2022 CLINICAL DATA:  Central venous catheter placement. EXAM: PORTABLE CHEST 1 VIEW COMPARISON:  Chest radiograph dated 04/25/2022. FINDINGS: Right IJ central venous line with tip at the cavoatrial junction. There is cardiomegaly with vascular congestion and edema, progressed since the prior radiograph. Superimposed pneumonia is not excluded. Small bilateral pleural effusions suspected. No pneumothorax. No acute osseous pathology. IMPRESSION: 1. Right IJ central venous line with tip at the cavoatrial junction. 2. Cardiomegaly with vascular congestion and edema. Electronically Signed   By: Anner Crete M.D.   On: 04/28/2022 17:15   CARDIAC CATHETERIZATION  Result Date: 04/28/2022 Conclusions: Severely elevated left and right heart filling pressures. Low Fick cardiac output/index. Successful placement of triple-lumen central venous catheter via the right internal jugular vein. Recommendations: Transfer to stepdown. Initiate milrinone 0.25 mcg/kg/min, with titration based on urine output and cooximetry. Hold carvedilol in the setting of low-output heart failure. Restart furosemide 80 mg IV BID; titrate as needed for net negative fluid balance of at least 2 L per 24 hours. Nelva Bush, MD [facility]    Medications:    sodium chloride     amiodarone     heparin 1,400 Units/hr (04/30/22 1112)   milrinone 0.125 mcg/kg/min (04/30/22 1112)    Chlorhexidine Gluconate Cloth  6 each Topical Daily   insulin aspart  0-5 Units Subcutaneous QHS   insulin aspart  0-9 Units Subcutaneous TID WC   levothyroxine  100 mcg Oral Q0600   lipase/protease/amylase  36,000 Units Oral TID WC   mupirocin ointment  1 Application Nasal BID   polyethylene glycol  34 g Oral BID   sodium chloride flush  3  mL Intravenous Q12H   sodium chloride flush  3 mL Intravenous Q12H   spironolactone  25 mg Oral Daily   sodium chloride, acetaminophen **OR** acetaminophen, ondansetron **OR** ondansetron (ZOFRAN) IV, oxyCODONE, sodium chloride flush  Assessment/ Plan:  Hector [name] is a 57 y.o. black male with past medical conditions including hypertension, paroxysmal A-fib on Eliquis, CHF with a EF less than 20%, and chronic kidney disease stage IV followed by [facility]. Patient presents to the emergency department with lower extremity edema and shortness of breath. He has been admitted for Hyperkalemia [E87.5] CHF exacerbation (HCC) [I50.9] Stage 4 chronic kidney disease ([place]) [N18.4] Acute on chronic congestive  heart failure, unspecified heart failure type ([place]) [I50.9]  Acute Kidney Injury with hyponatremia on chronic kidney disease stage 4 with baseline creatinine 2.83 and GFR of 25 on 03/02/22. Hyperkalemia has resolved.  Acute kidney injury secondary to acute cardiorenal syndrome.  Chronic kidney disease is secondary to hypertension.   - continue furosemide and spironolactone - holding canagliflozin due to acute kidney injury - holding metolazone  Lab Results  Component Value Date   CREATININE 3.51 (H) 04/30/2022   CREATININE 3.61 (H) 04/29/2022   CREATININE 3.69 (H) 04/28/2022    Intake/Output Summary (Last 24 hours) at 04/30/2022 1151 Last data filed at 04/30/2022 1112 Gross per 24 hour  Intake 1493.7 ml  Output 4600 ml  Net -3106.3 ml    2. Anemia of chronic kidney disease Lab Results  Component Value Date   HGB 12.2 (L) 04/30/2022    Hgb at goal. Will monitor.   3. Acute on chronic systolic and diastolic heart failure. ECHO from 12/08/2021 shows EF less than 20% with a moderate to severe MVR. Appreciate cardiology input. Now on milrinone gtt.   - Diuretics as above: furosemide and spironolactone   4. Diabetes mellitus type II with chronic kidney disease: holding  cangliflozin due to AKI. Continue glucose control.     LOS: 4 Teshara Moree 3/7/202411:51 AM

## 2022-04-30 NOTE — Progress Notes (Signed)
ANTICOAGULATION CONSULT NOTE  Pharmacy Consult for IV heparin Indication: atrial fibrillation  Allergies  Allergen Reactions   Esomeprazole Magnesium Other (See Comments)    Suspected interstitial nephritis 2018   Eggs Or Egg-Derived Products Rash    Patient Measurements: Height: '5\' 11"'$  (180.3 cm) Weight: 107.2 kg (236 lb 5.3 oz) IBW/kg (Calculated) : 75.3 Heparin Dosing Weight: 99.2 kg  Vital Signs: BP: 107/88 (03/07 0200) Pulse Rate: 43 (03/07 0200)  Labs: Recent Labs    04/27/22 2142 04/28/22 0552 04/28/22 0712 04/29/22 0102 04/29/22 0357 04/29/22 1028 04/29/22 2026 04/30/22 0425  HGB  --   --   --   --  11.6*  --   --  12.2*  HCT  --   --   --   --  36.1*  --   --  37.3*  PLT  --   --   --   --  194  --   --  209  APTT  --   --  123* 54*  --  78*  --   --   HEPARINUNFRC   < >  --  0.95*  --   --  0.46 0.31 0.31  CREATININE  --  3.69*  --   --  3.61*  --   --  3.51*   < > = values in this interval not displayed.    Estimated Creatinine Clearance: 28.9 mL/min (A) (by C-G formula based on SCr of 3.51 mg/dL (H)).  Medical History: Past Medical History:  Diagnosis Date   Alcohol abuse    Alcoholic cardiomyopathy ([place])    a. 12/2007 MV: EF 28%, no isch;  b. 8/12 Echo: EF 25-35%; c. 02/2014 Echo: EF 20-25%; d. 12/2014 Cath: minimal CAD; e. 01/2015 Echo: EF 50-55%;  d. 05/2016 Echo: EF 30-35%, diff HK, gr2 DD; e. 11/2016 Echo: EF 45-50%, diff HK; f. 09/2021 Echo: EF <20%; g. 11/2021 Echo: EF<20%.   Chronic combined systolic (congestive) and diastolic (congestive) heart failure ([place])    a. 05/2016 Echo: EF 30-35%; b. 11/2016 Echo: EF 45-50%, diff HK; c. 09/2021 Echo: EF < 20%; d. 11/2021 Echo: EF <20%, no rwma, Nl RV fxn, sev dil LA, mod-sev MR. mod dil PA w/ mildly elev PASP.   CKD (chronic kidney disease), stage III (HCC)    Elevated troponin (chronic)    Essential hypertension    GI bleed 11/2013   Hyperlipidemia    Pancreatitis    Paroxysmal A-fib (HCC)    a. new  onset s/p unsuccessful TEE/DCCV on 08/16/2014; b. CHA2DS2VASc = 3-> eliquis (freq noncompliant); c. 02/2021 s/p TEE/DCCV; d. Prev on amio->d/c 2/2 abnl TFTs.   Paroxysmal atrial flutter (HCC)    Sleep-disordered breathing    Has yet to have a sleep study   Stroke [facility])     Medications:  Apixaban 5 mg BID PTA  Assessment: 57 year old male being admitted with acute on chronic systolic CHF. PMH includes Afib on Eliquis prior to admission, pancreatitis, sCHF with EF < 20%, CKD-IV, HTN, HLD, stroke, hypothyroidism, alcohol abuse in remission, GI bleeding, OSA, anemia, hospitalized 2 months ago with RSV.  Pharmacy has been consulted to start IV heparin due to possible procedure H&H stable.   Last dose Eliquis 3/3 AM '@0905'$  (> 8 hours since last dose)  Goal of Therapy:  aPTT 66-102 seconds HL goal 0.3-0.7   Monitor platelets by anticoagulation protocol: Yes   Results: 3/4 @ 0259:  aPTT = 139,  HL = > 1.10  3/4 '@1321'$  aPTT = 79, Therapeutic x 1, heparin rate 1200 units/hr 3/4 @ 2142 aPTT = 54, subtherapeutic 3/5 @ 0712 aPTT = 123, HL = 0.95, SUPRAtherapeutic, rate 1500 units/hr 3/6 @ 0102 aPTT = 54, subtherapeutic 3/6 @ 1028 aPTT = 78, HL = 0.46, therapeutic and correlating  Date Time aPTT/HL Rate/Comment 3/6 1028 78 / 0.46 Therapeutic + correlating / 1400 u/hr 3/6 2026 HL 0.31 Therapeutic x 2 3/7 0425 HL 0.31 Therapeutic x 3  Plan:  Continue heparin at current rate of 1400 un/hr Check heparin level tomorrow morning with AM labs Continue to monitor H&H and platelets daily while on heparin gtt.  Renda Rolls, PharmD, [facility] 04/30/2022 4:53 AM

## 2022-04-30 NOTE — Assessment & Plan Note (Addendum)
S/p successful DCCV and restoration of normal rhythm. -Home Eliquis was restarted yesterday -IV amiodarone is being converted to p.o. today

## 2022-04-30 NOTE — Progress Notes (Signed)
Patient refused CBG check. Education provided.

## 2022-05-01 ENCOUNTER — Inpatient Hospital Stay: Payer: Medicaid Other | Admitting: Certified Registered"

## 2022-05-01 ENCOUNTER — Other Ambulatory Visit: Payer: Self-pay

## 2022-05-01 ENCOUNTER — Encounter
Admission: EM | Disposition: A | Discharge status: Home / Self Care | Payer: Self-pay | Source: Home / Self Care | Attending: Internal Medicine

## 2022-05-01 ENCOUNTER — Inpatient Hospital Stay (HOSPITAL_COMMUNITY)
Admit: 2022-05-01 | Discharge: 2022-05-01 | Disposition: A | Discharge status: Home / Self Care | Payer: Medicaid Other | Attending: Cardiology | Admitting: Cardiology

## 2022-05-01 DIAGNOSIS — I5023 Acute on chronic systolic (congestive) heart failure: Secondary | ICD-10-CM

## 2022-05-01 DIAGNOSIS — I4891 Unspecified atrial fibrillation: Secondary | ICD-10-CM | POA: Diagnosis not present

## 2022-05-01 DIAGNOSIS — I428 Other cardiomyopathies: Secondary | ICD-10-CM | POA: Diagnosis not present

## 2022-05-01 DIAGNOSIS — N184 Chronic kidney disease, stage 4 (severe): Secondary | ICD-10-CM | POA: Diagnosis not present

## 2022-05-01 HISTORY — PX: TEE WITHOUT CARDIOVERSION: SHX5443

## 2022-05-01 LAB — COOXEMETRY PANEL
Carboxyhemoglobin: 1.8 % — ABNORMAL HIGH (ref 0.5–1.5)
Methemoglobin: <0.7 % (ref 0.0–1.5)
O2 Saturation: 74.8 %
Total hemoglobin: 12.5 g/dL (ref 12.0–16.0)
Total oxygen content: 73.5 %

## 2022-05-01 LAB — BASIC METABOLIC PANEL
Anion gap: 11 (ref 5–15)
BUN: 69 mg/dL — ABNORMAL HIGH (ref 6–20)
CO2: 28 mmol/L (ref 22–32)
Calcium: 8.9 mg/dL (ref 8.9–10.3)
Chloride: 92 mmol/L — ABNORMAL LOW (ref 98–111)
Creatinine, Ser: 3.61 mg/dL — ABNORMAL HIGH (ref 0.61–1.24)
GFR, Estimated: 19 mL/min — ABNORMAL LOW (ref >60)
Glucose, Bld: 113 mg/dL — ABNORMAL HIGH (ref 70–99)
Potassium: 4.2 mmol/L (ref 3.5–5.1)
Sodium: 131 mmol/L — ABNORMAL LOW (ref 135–145)

## 2022-05-01 LAB — CBC
HCT: 38.3 % — ABNORMAL LOW (ref 39.0–52.0)
Hemoglobin: 12.2 g/dL — ABNORMAL LOW (ref 13.0–17.0)
MCH: 27.1 pg (ref 26.0–34.0)
MCHC: 31.9 g/dL (ref 30.0–36.0)
MCV: 84.9 fL (ref 80.0–100.0)
Platelets: 225 10*3/uL (ref 150–400)
RBC: 4.51 MIL/uL (ref 4.22–5.81)
RDW: 21.4 % — ABNORMAL HIGH (ref 11.5–15.5)
WBC: 8.7 10*3/uL (ref 4.0–10.5)
nRBC: 0 % (ref 0.0–0.2)

## 2022-05-01 LAB — HEPARIN LEVEL (UNFRACTIONATED): Heparin Unfractionated: 0.49 IU/mL (ref 0.30–0.70)

## 2022-05-01 LAB — ECHO TEE: Est EF: <20

## 2022-05-01 LAB — PROTIME-INR
INR: 1.2 (ref 0.8–1.2)
Prothrombin Time: 14.9 s (ref 11.4–15.2)

## 2022-05-01 LAB — MAGNESIUM: Magnesium: 2.2 mg/dL (ref 1.7–2.4)

## 2022-05-01 SURGERY — ECHOCARDIOGRAM, TRANSESOPHAGEAL
Anesthesia: General

## 2022-05-01 MED ORDER — SODIUM CHLORIDE 0.9 % IV SOLN: INTRAVENOUS | Status: DC

## 2022-05-01 MED ORDER — PROPOFOL 10 MG/ML IV BOLUS
INTRAVENOUS | Status: DC | PRN
  Administered 2022-05-01 (×3): 10 mg via INTRAVENOUS
  Administered 2022-05-01: 50 mg via INTRAVENOUS

## 2022-05-01 MED ORDER — BUTAMBEN-TETRACAINE-BENZOCAINE 2-2-14 % EX AERO
INHALATION_SPRAY | CUTANEOUS | Status: AC
  Filled 2022-05-01: qty 5

## 2022-05-01 MED ORDER — PROPOFOL 10 MG/ML IV BOLUS
INTRAVENOUS | Status: AC
  Filled 2022-05-01: qty 20

## 2022-05-01 MED ORDER — SPIRONOLACTONE 12.5 MG HALF TABLET
12.5000 mg | ORAL_TABLET | Freq: Every day | ORAL | Status: DC
  Administered 2022-05-02 – 2022-05-04 (×3): 12.5 mg via ORAL
  Filled 2022-05-01 (×4): qty 1

## 2022-05-01 MED ORDER — LIDOCAINE VISCOUS HCL 2 % MT SOLN
OROMUCOSAL | Status: AC
  Filled 2022-05-01: qty 15

## 2022-05-01 NOTE — Plan of Care (Signed)
  Problem: Clinical Measurements: Goal: Ability to maintain clinical measurements within normal limits will improve Outcome: Progressing   

## 2022-05-01 NOTE — Progress Notes (Signed)
[facility]  ROUNDING NOTE   Subjective:   Hector [name] is a 57 y.o. male with past medical conditions including hypertension, paroxysmal A-fib on Eliquis, CHF with a EF less than 20%, and chronic kidney disease stage IV. Patient presents to the emergency department with lower extremity edema and shortness of breath. He has been admitted for Hyperkalemia [E87.5] CHF exacerbation (HCC) [I50.9] Stage 4 chronic kidney disease ([place]) [N18.4] Acute on chronic congestive heart failure, unspecified heart failure type [facility]) [I50.9]  Patient is known to our practice from previous hospital admissions and is followed outpatient by [facility] neph[facility].    UOP 3300  Off milrinone  Cardioversion today.    Objective:  Vital signs in last 24 hours:  Temp:  [98.2 F (36.8 C)-98.8 F (37.1 C)] 98.8 F (37.1 C) (03/08 1212) Pulse Rate:  [51-115] 99 (03/08 1212) Resp:  [15-27] 20 (03/08 1212) BP: (106-127)/(78-102) 115/83 (03/08 1212) SpO2:  [91 %-97 %] 93 % (03/08 1212) Weight:  [104.5 kg] 104.5 kg (03/08 0600)  Weight change:  Filed Weights   04/29/22 0500 04/30/22 0724 05/01/22 0600  Weight: 107.2 kg 107.2 kg 104.5 kg    Intake/Output: I/O last 3 completed shifts: In: 1345.7 [P.O.:500; I.V.:845.7] Out: 6450 [Urine:6450]   Intake/Output this shift:  Total I/O In: 185.9 [I.V.:185.9] Out: 600 [Urine:600]  Physical Exam: General: NAD, laying in bed  Head: Normocephalic, atraumatic. Moist oral mucosal membranes  Eyes: Anicteric  Lungs:  Clear to auscultation, normal effort  Heart: irregular  Abdomen:  Soft, nontender, nondistended  Extremities: No peripheral edema.  Neurologic: Nonfocal, moving all four extremities  Skin: No lesions  Access: None    Basic Metabolic Panel: Recent Labs  Lab 04/25/22 1821 04/25/22 2328 04/27/22 0259 04/28/22 0552 04/28/22 1605 04/28/22 1616 04/29/22 0357 04/30/22 0425 05/01/22 0459  NA 132*   < > 132* 128* 134* 134* 134*  134* 131*  K 6.2*   < > 4.1 3.8 4.0 3.9 3.6 3.8 4.2  CL 97*   < > 96* 94*  --   --  97* 96* 92*  CO2 25   < > 22 26  --   --  '25 27 28  '$ GLUCOSE 123*   < > 109* 115*  --   --  169* 110* 113*  BUN 60*   < > 67* 69*  --   --  69* 66* 69*  CREATININE 3.15*   < > 3.61* 3.69*  --   --  3.61* 3.51* 3.61*  CALCIUM 8.6*   < > 8.3* 8.1*  --   --  8.2* 8.7* 8.9  MG 2.5*  --   --   --   --   --  2.5* 2.2 2.2   < > = values in this interval not displayed.     Liver Function Tests: Recent Labs  Lab 04/25/22 1821 04/26/22 0824 04/27/22 0259 04/28/22 0552 04/30/22 0425  AST 33 '23 23 21 '$ 52*  ALT '13 16 18 20 '$ 63*  ALKPHOS 94 93 88 93 111  BILITOT 4.8* 5.4* 5.1* 4.6* 4.6*  PROT 7.7 6.8 6.5 6.5 6.9  ALBUMIN 3.5 3.5 3.1* 3.1* 3.3*    Recent Labs  Lab 04/25/22 1821  LIPASE 24    No results for input(s): "AMMONIA" in the last 168 hours.  CBC: Recent Labs  Lab 04/26/22 1646 04/27/22 0259 04/28/22 1605 04/28/22 1616 04/29/22 0357 04/30/22 0425 05/01/22 0459  WBC 7.8 8.2  --   --  8.3  7.8 8.7  HGB 11.9* 11.8* 12.9* 12.6* 11.6* 12.2* 12.2*  HCT 36.9* 36.2* 38.0* 37.0* 36.1* 37.3* 38.3*  MCV 83.9 84.2  --   --  84.9 84.6 84.9  PLT 223 230  --   --  194 209 225     Cardiac Enzymes: Recent Labs  Lab 04/26/22 1646  CKTOTAL 291     BNP: Invalid input(s): "POCBNP"  CBG: Recent Labs  Lab 04/29/22 2147  GLUCAP 101*     Microbiology: Results for orders placed or performed during the hospital encounter of 04/25/22  Resp panel by RT-PCR (RSV, Flu A&B, Covid) Anterior Nasal Swab     Status: None   Collection Time: 04/25/22  8:39 PM   Specimen: Anterior Nasal Swab  Result Value Ref Range Status   SARS Coronavirus 2 by RT PCR NEGATIVE NEGATIVE Final    Comment: (NOTE) SARS-CoV-2 target nucleic acids are NOT DETECTED.  The SARS-CoV-2 RNA is generally detectable in upper respiratory specimens during the acute phase of infection. The lowest concentration of SARS-CoV-2 viral  copies this assay can detect is 138 copies/mL. A negative result does not preclude SARS-Cov-2 infection and should not be used as the sole basis for treatment or other patient management decisions. A negative result may occur with  improper specimen collection/handling, submission of specimen other than nasopharyngeal swab, presence of viral mutation(s) within the areas targeted by this assay, and inadequate number of viral copies(<138 copies/mL). A negative result must be combined with clinical observations, patient history, and epidemiological information. The expected result is Negative.  Fact Sheet for Patients:  EntrepreneurPulse.com.au  Fact Sheet for Healthcare Providers:  IncredibleEmployment.be  This test is no t yet approved or cleared by the [place] FDA and  has been authorized for detection and/or diagnosis of SARS-CoV-2 by FDA under an Emergency Use Authorization (EUA). This EUA will remain  in effect (meaning this test can be used) for the duration of the COVID-19 declaration under Section 564(b)(1) of the Act, 21 U.S.C.section 360bbb-3(b)(1), unless the authorization is terminated  or revoked sooner.       Influenza A by PCR NEGATIVE NEGATIVE Final   Influenza B by PCR NEGATIVE NEGATIVE Final    Comment: (NOTE) The Xpert Xpress SARS-CoV-2/FLU/RSV plus assay is intended as an aid in the diagnosis of influenza from Nasopharyngeal swab specimens and should not be used as a sole basis for treatment. Nasal washings and aspirates are unacceptable for Xpert Xpress SARS-CoV-2/FLU/RSV testing.  Fact Sheet for Patients: EntrepreneurPulse.com.au  Fact Sheet for Healthcare Providers: IncredibleEmployment.be  This test is not yet approved or cleared by the [place] FDA and has been authorized for detection and/or diagnosis of SARS-CoV-2 by FDA under an Emergency Use Authorization (EUA). This  EUA will remain in effect (meaning this test can be used) for the duration of the COVID-19 declaration under Section 564(b)(1) of the Act, 21 U.S.C. section 360bbb-3(b)(1), unless the authorization is terminated or revoked.     Resp Syncytial Virus by PCR NEGATIVE NEGATIVE Final    Comment: (NOTE) Fact Sheet for Patients: EntrepreneurPulse.com.au  Fact Sheet for Healthcare Providers: IncredibleEmployment.be  This test is not yet approved or cleared by the [place] FDA and has been authorized for detection and/or diagnosis of SARS-CoV-2 by FDA under an Emergency Use Authorization (EUA). This EUA will remain in effect (meaning this test can be used) for the duration of the COVID-19 declaration under Section 564(b)(1) of the Act, 21 U.S.C. section 360bbb-3(b)(1), unless the authorization is  terminated or revoked.  Performed at [facility], [address] [place], [place] [address]   MRSA Next Gen by PCR, Nasal     Status: Abnormal   Collection Time: 04/28/22  5:37 PM   Specimen: Nasal Mucosa; Nasal Swab  Result Value Ref Range Status   MRSA by PCR Next Gen DETECTED (A) NOT DETECTED Final    Comment: CRITICAL RESULT CALLED TO, READ BACK BY AND VERIFIED WITH: RN MARCEL TURNER ON 04/28/22 '@1940'$  RP (NOTE) The GeneXpert MRSA Assay (FDA approved for NASAL specimens only), is one component of a comprehensive MRSA colonization surveillance program. It is not intended to diagnose MRSA infection nor to guide or monitor treatment for MRSA infections. Test performance is not FDA approved in patients less than 72 years old. Performed at [facility], [address] [place], [place] [address]     Coagulation Studies: No results for input(s): "LABPROT", "INR" in the last 72 hours.   Urinalysis: No results for input(s): "COLORURINE", "LABSPEC", "PHURINE", "GLUCOSEU", "HGBUR", "BILIRUBINUR", "KETONESUR", "PROTEINUR",  "UROBILINOGEN", "NITRITE", "LEUKOCYTESUR" in the last 72 hours.  Invalid input(s): "APPERANCEUR"    Imaging: No results found.   Medications:    [MAR Hold] sodium chloride     sodium chloride     amiodarone 30 mg/hr (05/01/22 1150)   heparin 1,400 Units/hr (05/01/22 1150)    [MAR Hold] Chlorhexidine Gluconate Cloth  6 each Topical Daily   [MAR Hold] insulin aspart  0-5 Units Subcutaneous QHS   [MAR Hold] insulin aspart  0-9 Units Subcutaneous TID WC   [MAR Hold] levothyroxine  100 mcg Oral Q0600   [MAR Hold] lipase/protease/amylase  36,000 Units Oral TID WC   [MAR Hold] mupirocin ointment  1 Application Nasal BID   [MAR Hold] polyethylene glycol  34 g Oral BID   [MAR Hold] sodium chloride flush  3 mL Intravenous Q12H   [MAR Hold] sodium chloride flush  3 mL Intravenous Q12H   [MAR Hold] spironolactone  12.5 mg Oral Daily   [MAR Hold] sodium chloride, [MAR Hold] acetaminophen **OR** [MAR Hold] acetaminophen, [MAR Hold] ondansetron **OR** [MAR Hold] ondansetron (ZOFRAN) IV, [MAR Hold] mouth rinse, [MAR Hold] oxyCODONE, [MAR Hold] sodium chloride flush  Assessment/ Plan:  Mr. DRYSTAN BOUSE is a 57 y.o. black male with past medical conditions including hypertension, paroxysmal A-fib on Eliquis, CHF with a EF less than 20%, and chronic kidney disease stage IV followed by [facility]. Patient presents to the emergency department with lower extremity edema and shortness of breath. He has been admitted for Hyperkalemia [E87.5] CHF exacerbation (HCC) [I50.9] Stage 4 chronic kidney disease ([place]) [N18.4] Acute on chronic congestive heart failure, unspecified heart failure type ([place]) [I50.9]  Acute Kidney Injury with hyponatremia on chronic kidney disease stage 4 with baseline creatinine 2.83 and GFR of 25 on 03/02/22. Hyperkalemia has resolved.  Acute kidney injury secondary to acute cardiorenal syndrome.  Chronic kidney disease is secondary to hypertension.   - continue furosemide  and spironolactone - holding canagliflozin due to acute kidney injury - holding metolazone  Lab Results  Component Value Date   CREATININE 3.61 (H) 05/01/2022   CREATININE 3.51 (H) 04/30/2022   CREATININE 3.61 (H) 04/29/2022    Intake/Output Summary (Last 24 hours) at 05/01/2022 1224 Last data filed at 05/01/2022 1150 Gross per 24 hour  Intake 1135.23 ml  Output 3100 ml  Net -1964.77 ml    2. Anemia of chronic kidney disease Lab Results  Component Value Date   HGB  12.2 (L) 05/01/2022    Hgb at goal. Will monitor.   3. Acute on chronic systolic and diastolic heart failure. ECHO from 12/08/2021 shows EF less than 20% with a moderate to severe MVR. Appreciate cardiology input. Now on milrinone gtt.   - Diuretics as above: furosemide and spironolactone   4. Diabetes mellitus type II with chronic kidney disease: holding cangliflozin due to AKI. Continue glucose control.     LOS: 5 Emrick Hensch 3/8/202412:24 PM

## 2022-05-01 NOTE — Progress Notes (Signed)
[name], male   DOB: 07/05/1965, 57 y.o.   MRN: [id number]     Advanced Heart Failure Rounding Note  PCP-Cardiologist: Ida Rogue, MD   Subjective:    No dyspnea this morning.   Co-ox 75% on milrinone 0.125.  Lasix stopped after yesterday am's dose.  He remains on amiodarone 30 mg/hr and heparin gtt, he is in atrial fibrillation rate 90s-100s.  SBP 100s-110s.    Creatinine mildly higher, 3.51 => 3.61. CVP 6, I/Os net negative 2120.   Objective:   Weight Range: 104.5 kg Body mass index is 32.13 kg/m.   Vital Signs:   Temp:  [98.2 F (36.8 C)-99 F (37.2 C)] 98.3 F (36.8 C) (03/08 0400) Pulse Rate:  [44-115] 106 (03/08 0600) Resp:  [15-27] 20 (03/08 0600) BP: (102-127)/(78-102) 110/90 (03/08 0500) SpO2:  [91 %-97 %] 93 % (03/08 0600) Weight:  [104.5 kg] 104.5 kg (03/08 0600) Last BM Date : 04/28/22  Weight change: Filed Weights   04/29/22 0500 04/30/22 0724 05/01/22 0600  Weight: 107.2 kg 107.2 kg 104.5 kg    Intake/Output:   Intake/Output Summary (Last 24 hours) at 05/01/2022 0742 Last data filed at 05/01/2022 0600 Gross per 24 hour  Intake 1179.4 ml  Output 3300 ml  Net -2120.6 ml      Physical Exam    General:  Well appearing. No resp difficulty HEENT: Normal Neck: Supple. JVP not elevated. Carotids 2+ bilat; no bruits. No lymphadenopathy or thyromegaly appreciated. Cor: PMI nondisplaced. Irregular rate & rhythm. No rubs, gallops or murmurs. Lungs: Clear Abdomen: Soft, nontender, nondistended. No hepatosplenomegaly. No bruits or masses. Good bowel sounds. Extremities: No cyanosis, clubbing, rash, edema Neuro: Alert & orientedx3, cranial nerves grossly intact. moves all 4 extremities w/o difficulty. Affect pleasant   Telemetry   AF 90s-100s (personally reviewed)    Labs    CBC Recent Labs    04/30/22 0425 05/01/22 0459  WBC 7.8 8.7  HGB 12.2* 12.2*  HCT 37.3* 38.3*  MCV 84.6 84.9  PLT 209 [id number]   Basic Metabolic  Panel Recent Labs    04/30/22 0425 05/01/22 0459  NA 134* 131*  K 3.8 4.2  CL 96* 92*  CO2 27 28  GLUCOSE 110* 113*  BUN 66* 69*  CREATININE 3.51* 3.61*  CALCIUM 8.7* 8.9  MG 2.2 2.2   Liver Function Tests Recent Labs    04/30/22 0425  AST 52*  ALT 63*  ALKPHOS 111  BILITOT 4.6*  PROT 6.9  ALBUMIN 3.3*   No results for input(s): "LIPASE", "AMYLASE" in the last 72 hours. Cardiac Enzymes No results for input(s): "CKTOTAL", "CKMB", "CKMBINDEX", "TROPONINI" in the last 72 hours.  BNP: BNP (last 3 results) Recent Labs    02/23/22 0920 03/23/22 0642 04/26/22 1646  BNP 609.5* 736.5* 2,109.1*    ProBNP (last 3 results) No results for input(s): "PROBNP" in the last 8760 hours.   D-Dimer No results for input(s): "DDIMER" in the last 72 hours. Hemoglobin A1C No results for input(s): "HGBA1C" in the last 72 hours. Fasting Lipid Panel No results for input(s): "CHOL", "HDL", "LDLCALC", "TRIG", "CHOLHDL", "LDLDIRECT" in the last 72 hours. Thyroid Function Tests No results for input(s): "TSH", "T4TOTAL", "T3FREE", "THYROIDAB" in the last 72 hours.  Invalid input(s): "FREET3"  Other results:   Imaging    No results found.   Medications:     Scheduled Medications:  Chlorhexidine Gluconate Cloth  6 each Topical Daily   insulin aspart  0-5 Units Subcutaneous  QHS   insulin aspart  0-9 Units Subcutaneous TID WC   levothyroxine  100 mcg Oral Q0600   lipase/protease/amylase  36,000 Units Oral TID WC   mupirocin ointment  1 Application Nasal BID   polyethylene glycol  34 g Oral BID   sodium chloride flush  3 mL Intravenous Q12H   sodium chloride flush  3 mL Intravenous Q12H   spironolactone  25 mg Oral Daily    Infusions:  sodium chloride     amiodarone 30 mg/hr (05/01/22 0600)   heparin 1,400 Units/hr (05/01/22 0600)    PRN Medications: sodium chloride, acetaminophen **OR** acetaminophen, ondansetron **OR** ondansetron (ZOFRAN) IV, mouth rinse,  oxyCODONE, sodium chloride flush   Assessment/Plan   1. Acute on chronic systolic CHF: Nonischemic cardiomyopathy.  Echo in 10/23 with EF < 20%, mild-moderate LV dilation, mod-severe MR, normal RV.  H/o noncompliance, ETOH abuse thought to be cause of cardiomyopathy.  Cath in 2016 with minimal coronary disease. RHC this admission with significant volume overload and low cardiac index 1.5.  He is currently on milrinone 0.125, co-ox 75%.  CVP down to 6 with good diuresis.  Creatinine mildly higher 3.51 => 3.61.  - Stop milrinone today.  - Hold Lasix today with normal CVP and mildly higher creatinine.  Will need to start po diuretic eventually, would use torsemide.  - GDMT limited by soft BP and CKD stage IV. Bidil may be option if BP remains stable.  - Would be careful with spironolactone in CKD stage IV, decrease to 12.5 daily.  - Needs conversion to NSR, see below.  2. Atrial fibrillation: Persistent.  Had been planned for DCCV prior to admission but got admitted first.  I suspect elevated tbili is due to CHF rather than amiodarone.  - Continue amiodarone gtt 30 mg/hr.  - Continue heparin gtt, eventually to Eliquis.  - TEE-guided DCCV today.  Discussed risks/benefits with patient and he agrees to procedure.  3. CKD stage IV: Creatinine mildly higher today at 3.61.  4. Elevated LFTs: Tbili elevated.  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  I suspect elevated bilirubin this admission was due to CHF rather than amiodarone.  - Follow LFTs.    Length of Stay: [place], MD  05/01/2022, 7:42 AM  Advanced Heart Failure Team Pager [phone] (M-F; 7a - 5p)  Please contact [facility] for night-coverage after hours (5p -7a ) and weekends on [email or web address]

## 2022-05-01 NOTE — Anesthesia Procedure Notes (Signed)
Date/Time: 05/01/2022 12:54 PM  Performed by: Doreen Salvage, CRNAPre-anesthesia Checklist: Patient identified, Emergency Drugs available, Suction available and Patient being monitored Patient Re-evaluated:Patient Re-evaluated prior to induction Oxygen Delivery Method: Nasal cannula Induction Type: IV induction Airway Equipment and Method: Bite block Dental Injury: Teeth and Oropharynx as per pre-operative assessment  Comments: Nasal cannula with etCO2 monitoring

## 2022-05-01 NOTE — Progress Notes (Signed)
Pt refused CBG checks. He would not change his socks to non-skid socks. He also did not want the bed alarm on.

## 2022-05-01 NOTE — Progress Notes (Signed)
[place] for IV heparin Indication: atrial fibrillation  Allergies  Allergen Reactions   Esomeprazole Magnesium Other (See Comments)    Suspected interstitial nephritis 2018   Egg-Derived Products Rash    Patient Measurements: Height: '5\' 11"'$  (180.3 cm) Weight: 107.2 kg (236 lb 5.3 oz) (pt refused. previous weight documented) IBW/kg (Calculated) : 75.3 Heparin Dosing Weight: 99.2 kg  Vital Signs: Temp: 98.3 F (36.8 C) (03/08 0400) Temp Source: Axillary (03/08 0400) BP: 113/91 (03/08 0400) Pulse Rate: 37 (03/08 0400)  Labs: Recent Labs    04/28/22 0712 04/28/22 1605 04/29/22 0102 04/29/22 0357 04/29/22 1028 04/29/22 2026 04/30/22 0425 05/01/22 0459  HGB  --    < >  --  11.6*  --   --  12.2* 12.2*  HCT  --    < >  --  36.1*  --   --  37.3* 38.3*  PLT  --   --   --  194  --   --  209 225  APTT 123*  --  54*  --  78*  --   --   --   HEPARINUNFRC 0.95*  --   --   --  0.46 0.31 0.31 0.49  CREATININE  --   --   --  3.61*  --   --  3.51* 3.61*   < > = values in this interval not displayed.    Estimated Creatinine Clearance: 28.1 mL/min (A) (by C-G formula based on SCr of 3.61 mg/dL (H)).  Medical History: Past Medical History:  Diagnosis Date   Alcohol abuse    Alcoholic cardiomyopathy ([place])    a. 12/2007 MV: EF 28%, no isch;  b. 8/12 Echo: EF 25-35%; c. 02/2014 Echo: EF 20-25%; d. 12/2014 Cath: minimal CAD; e. 01/2015 Echo: EF 50-55%;  d. 05/2016 Echo: EF 30-35%, diff HK, gr2 DD; e. 11/2016 Echo: EF 45-50%, diff HK; f. 09/2021 Echo: EF <20%; g. 11/2021 Echo: EF<20%.   Chronic combined systolic (congestive) and diastolic (congestive) heart failure ([place])    a. 05/2016 Echo: EF 30-35%; b. 11/2016 Echo: EF 45-50%, diff HK; c. 09/2021 Echo: EF < 20%; d. 11/2021 Echo: EF <20%, no rwma, Nl RV fxn, sev dil LA, mod-sev MR. mod dil PA w/ mildly elev PASP.   CKD (chronic kidney disease), stage III (HCC)    Elevated troponin (chronic)     Essential hypertension    GI bleed 11/2013   Hyperlipidemia    Pancreatitis    Paroxysmal A-fib (HCC)    a. new onset s/p unsuccessful TEE/DCCV on 08/16/2014; b. CHA2DS2VASc = 3-> eliquis (freq noncompliant); c. 02/2021 s/p TEE/DCCV; d. Prev on amio->d/c 2/2 abnl TFTs.   Paroxysmal atrial flutter (HCC)    Sleep-disordered breathing    Has yet to have a sleep study   Stroke [facility])     Medications:  Apixaban 5 mg BID PTA  Assessment: 57 year old male being admitted with acute on chronic systolic CHF. PMH includes Afib on Eliquis prior to admission, pancreatitis, sCHF with EF < 20%, CKD-IV, HTN, HLD, stroke, hypothyroidism, alcohol abuse in remission, GI bleeding, OSA, anemia, hospitalized 2 months ago with RSV.  Pharmacy has been consulted to start IV heparin due to possible procedure H&H stable.   Last dose Eliquis 3/3 AM '@0905'$  (> 8 hours since last dose)  Goal of Therapy:  aPTT 66-102 seconds HL goal 0.3-0.7   Monitor platelets by anticoagulation protocol: Yes   Results: 3/4 @ 0259:  aPTT = 139,  HL = > 1.10 3/4 '@1321'$  aPTT = 79, Therapeutic x 1, heparin rate 1200 units/hr 3/4 @ 2142 aPTT = 54, subtherapeutic 3/5 @ 0712 aPTT = 123, HL = 0.95, SUPRAtherapeutic, rate 1500 units/hr 3/6 @ 0102 aPTT = 54, subtherapeutic 3/6 @ 1028 aPTT = 78, HL = 0.46, therapeutic and correlating  Date Time aPTT/HL Rate/Comment 3/6 1028 78 / 0.46 Therapeutic + correlating / 1400 u/hr 3/6 2026 HL 0.31 Therapeutic x 2 3/7 0425 HL 0.31 Therapeutic x 3 3/8 0459 HL 0.49 Therapeutic x 4  Plan:  Continue heparin at current rate of 1400 un/hr Check heparin level tomorrow morning with AM labs Continue to monitor H&H and platelets daily while on heparin gtt.  Renda Rolls, PharmD, [facility] 05/01/2022 5:23 AM

## 2022-05-01 NOTE — H&P (View-Only) (Signed)
[name], male   DOB: 06-May-1965, 57 y.o.   MRN: [id number]     Advanced Heart Failure Rounding Note  PCP-Cardiologist: Ida Rogue, MD   Subjective:    No dyspnea this morning.   Co-ox 75% on milrinone 0.125.  Lasix stopped after yesterday am's dose.  He remains on amiodarone 30 mg/hr and heparin gtt, he is in atrial fibrillation rate 90s-100s.  SBP 100s-110s.    Creatinine mildly higher, 3.51 => 3.61. CVP 6, I/Os net negative 2120.   Objective:   Weight Range: 104.5 kg Body mass index is 32.13 kg/m.   Vital Signs:   Temp:  [98.2 F (36.8 C)-99 F (37.2 C)] 98.3 F (36.8 C) (03/08 0400) Pulse Rate:  [44-115] 106 (03/08 0600) Resp:  [15-27] 20 (03/08 0600) BP: (102-127)/(78-102) 110/90 (03/08 0500) SpO2:  [91 %-97 %] 93 % (03/08 0600) Weight:  [104.5 kg] 104.5 kg (03/08 0600) Last BM Date : 04/28/22  Weight change: Filed Weights   04/29/22 0500 04/30/22 0724 05/01/22 0600  Weight: 107.2 kg 107.2 kg 104.5 kg    Intake/Output:   Intake/Output Summary (Last 24 hours) at 05/01/2022 0742 Last data filed at 05/01/2022 0600 Gross per 24 hour  Intake 1179.4 ml  Output 3300 ml  Net -2120.6 ml      Physical Exam    General:  Well appearing. No resp difficulty HEENT: Normal Neck: Supple. JVP not elevated. Carotids 2+ bilat; no bruits. No lymphadenopathy or thyromegaly appreciated. Cor: PMI nondisplaced. Irregular rate & rhythm. No rubs, gallops or murmurs. Lungs: Clear Abdomen: Soft, nontender, nondistended. No hepatosplenomegaly. No bruits or masses. Good bowel sounds. Extremities: No cyanosis, clubbing, rash, edema Neuro: Alert & orientedx3, cranial nerves grossly intact. moves all 4 extremities w/o difficulty. Affect pleasant   Telemetry   AF 90s-100s (personally reviewed)    Labs    CBC Recent Labs    04/30/22 0425 05/01/22 0459  WBC 7.8 8.7  HGB 12.2* 12.2*  HCT 37.3* 38.3*  MCV 84.6 84.9  PLT 209 [id number]   Basic Metabolic  Panel Recent Labs    04/30/22 0425 05/01/22 0459  NA 134* 131*  K 3.8 4.2  CL 96* 92*  CO2 27 28  GLUCOSE 110* 113*  BUN 66* 69*  CREATININE 3.51* 3.61*  CALCIUM 8.7* 8.9  MG 2.2 2.2   Liver Function Tests Recent Labs    04/30/22 0425  AST 52*  ALT 63*  ALKPHOS 111  BILITOT 4.6*  PROT 6.9  ALBUMIN 3.3*   No results for input(s): "LIPASE", "AMYLASE" in the last 72 hours. Cardiac Enzymes No results for input(s): "CKTOTAL", "CKMB", "CKMBINDEX", "TROPONINI" in the last 72 hours.  BNP: BNP (last 3 results) Recent Labs    02/23/22 0920 03/23/22 0642 04/26/22 1646  BNP 609.5* 736.5* 2,109.1*    ProBNP (last 3 results) No results for input(s): "PROBNP" in the last 8760 hours.   D-Dimer No results for input(s): "DDIMER" in the last 72 hours. Hemoglobin A1C No results for input(s): "HGBA1C" in the last 72 hours. Fasting Lipid Panel No results for input(s): "CHOL", "HDL", "LDLCALC", "TRIG", "CHOLHDL", "LDLDIRECT" in the last 72 hours. Thyroid Function Tests No results for input(s): "TSH", "T4TOTAL", "T3FREE", "THYROIDAB" in the last 72 hours.  Invalid input(s): "FREET3"  Other results:   Imaging    No results found.   Medications:     Scheduled Medications:  Chlorhexidine Gluconate Cloth  6 each Topical Daily   insulin aspart  0-5 Units Subcutaneous  QHS   insulin aspart  0-9 Units Subcutaneous TID WC   levothyroxine  100 mcg Oral Q0600   lipase/protease/amylase  36,000 Units Oral TID WC   mupirocin ointment  1 Application Nasal BID   polyethylene glycol  34 g Oral BID   sodium chloride flush  3 mL Intravenous Q12H   sodium chloride flush  3 mL Intravenous Q12H   spironolactone  25 mg Oral Daily    Infusions:  sodium chloride     amiodarone 30 mg/hr (05/01/22 0600)   heparin 1,400 Units/hr (05/01/22 0600)    PRN Medications: sodium chloride, acetaminophen **OR** acetaminophen, ondansetron **OR** ondansetron (ZOFRAN) IV, mouth rinse,  oxyCODONE, sodium chloride flush   Assessment/Plan   1. Acute on chronic systolic CHF: Nonischemic cardiomyopathy.  Echo in 10/23 with EF < 20%, mild-moderate LV dilation, mod-severe MR, normal RV.  H/o noncompliance, ETOH abuse thought to be cause of cardiomyopathy.  Cath in 2016 with minimal coronary disease. RHC this admission with significant volume overload and low cardiac index 1.5.  He is currently on milrinone 0.125, co-ox 75%.  CVP down to 6 with good diuresis.  Creatinine mildly higher 3.51 => 3.61.  - Stop milrinone today.  - Hold Lasix today with normal CVP and mildly higher creatinine.  Will need to start po diuretic eventually, would use torsemide.  - GDMT limited by soft BP and CKD stage IV. Bidil may be option if BP remains stable.  - Would be careful with spironolactone in CKD stage IV, decrease to 12.5 daily.  - Needs conversion to NSR, see below.  2. Atrial fibrillation: Persistent.  Had been planned for DCCV prior to admission but got admitted first.  I suspect elevated tbili is due to CHF rather than amiodarone.  - Continue amiodarone gtt 30 mg/hr.  - Continue heparin gtt, eventually to Eliquis.  - TEE-guided DCCV today.  Discussed risks/benefits with patient and he agrees to procedure.  3. CKD stage IV: Creatinine mildly higher today at 3.61.  4. Elevated LFTs: Tbili elevated.  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  I suspect elevated bilirubin this admission was due to CHF rather than amiodarone.  - Follow LFTs.    Length of Stay: [place], MD  05/01/2022, 7:42 AM  Advanced Heart Failure Team Pager [phone] (M-F; 7a - 5p)  Please contact [facility] for night-coverage after hours (5p -7a ) and weekends on [email or web address]

## 2022-05-01 NOTE — CV Procedure (Signed)
Procedure: TEE  Indication: Atrial fibrillation  Sedation: Per anesthesiology  Findings: Please see echo section for full report.  Mildly dilated LV with normal wall thickness.  Diffuse hypokinesis with EF < 20%, no LV thrombus noted.  Normal RV size with moderately decreased systolic function.  Moderate left atrial enlargement.  There was smoke (spontaneous contrast) but no thrombus in the LA appendage.  Mild right atrial enlargement.  No PFO or ASD.  Trivial TR.  Moderate MR with restricted posterior leaflet, likely functional.  Trileaflet aortic valve with no stenosis or regurgitation.  Normal caliber thoracic aorta with minimal plaque.   May proceed to DCCV.   Loralie Champagne 05/01/2022 1:09 PM

## 2022-05-01 NOTE — Transfer of Care (Signed)
Immediate Anesthesia Transfer of Care Note  Patient: Hector [name]  Procedure(s) Performed: TRANSESOPHAGEAL ECHOCARDIOGRAM  Patient Location: PACU  Anesthesia Type:General  Level of Consciousness: awake and alert   Airway & Oxygen Therapy: Patient Spontanous Breathing and Patient connected to nasal cannula oxygen  Post-op Assessment: Report given to RN and Post -op Vital signs reviewed and stable  Post vital signs: Reviewed and stable  Last Vitals:  Vitals Value Taken Time  BP 96/77 05/01/22 1306  Temp    Pulse 73 05/01/22 1308  Resp 22 05/01/22 1308  SpO2 84 % 05/01/22 1308    Last Pain:  Vitals:   05/01/22 1212  TempSrc: Oral  PainSc: 0-No pain      Patients Stated Pain Goal: 3 ([id number])  Complications: No notable events documented.

## 2022-05-01 NOTE — Procedures (Signed)
Electrical Cardioversion Procedure Note Hector [name] [id number] [birth year]  Procedure: Electrical Cardioversion Indications:  Atrial Fibrillation  Procedure Details Consent: Risks of procedure as well as the alternatives and risks of each were explained to the (patient/caregiver).  Consent for procedure obtained. Time Out: Verified patient identification, verified procedure, site/side was marked, verified correct patient position, special equipment/implants available, medications/allergies/relevent history reviewed, required imaging and test results available.  Performed  Patient placed on cardiac monitor, pulse oximetry, supplemental oxygen as necessary.  Sedation given:  Propofol per anesthesiology Pacer pads placed anterior and posterior chest.  Cardioverted 1 time(s).  Cardioverted at [place].  Evaluation Findings: Post procedure EKG shows: NSR Complications: None Patient did tolerate procedure well.   Loralie Champagne 05/01/2022, 1:09 PM

## 2022-05-01 NOTE — Anesthesia Preprocedure Evaluation (Addendum)
Anesthesia Evaluation  Patient identified by MRN, date of birth, ID band Patient awake  General Assessment Comment:Patient in ICU for HF exacerbation. On milrinone and amiodarone gtt. Currently in afib, rate controlled.   Reviewed: Allergy & Precautions, NPO status , Patient's Chart, lab work & pertinent test results  History of Anesthesia Complications Negative for: history of anesthetic complications  Airway Mallampati: III  TM Distance: >3 FB Neck ROM: full    Dental  (+) Chipped, Poor Dentition, Missing, Edentulous Upper   Pulmonary sleep apnea , former smoker   Pulmonary exam normal        Cardiovascular hypertension, +CHF  Normal cardiovascular exam  IMPRESSIONS     1. Left ventricular ejection fraction, by estimation, is <20%. The left  ventricle has severely decreased function. The left ventricle has no  regional wall motion abnormalities. The left ventricular internal cavity  size was mildly to moderately dilated.   2. Right ventricular systolic function is normal. The right ventricular  size is normal. There is mildly elevated pulmonary artery systolic  pressure.   3. Left atrial size was severely dilated.   4. Right atrial size was mildly dilated.   5. The mitral valve is normal in structure. Moderate to severe mitral  valve regurgitation. No evidence of mitral stenosis.   6. The aortic valve is normal in structure. Aortic valve regurgitation is  not visualized. No aortic stenosis is present.   7. Moderately dilated pulmonary artery.   8. The inferior vena cava is dilated in size with <50% respiratory  variability, suggesting right atrial pressure of 15 mmHg.     Neuro/Psych negative neurological ROS  negative psych ROS   GI/Hepatic negative GI ROS, Neg liver ROS,,,  Endo/Other  diabetesHypothyroidism    Renal/GU Renal disease  negative genitourinary   Musculoskeletal   Abdominal   Peds   Hematology  (+) Blood dyscrasia, anemia   Anesthesia Other Findings Past Medical History: No date: Alcohol abuse No date: Alcoholic cardiomyopathy ([place])     Comment:  a. 12/2007 MV: EF 28%, no isch;  b. 8/12 Echo: EF               25-35%; c. 02/2014 Echo: EF 20-25%; d. 12/2014 Cath:               minimal CAD; e. 01/2015 Echo: EF 50-55%;  d. 05/2016 Echo:              EF 30-35%, diff HK, gr2 DD; e. 11/2016 Echo: EF 45-50%,               diff HK; f. 09/2021 Echo: EF <20%; g. 11/2021 Echo:               EF<20%. No date: Chronic combined systolic (congestive) and diastolic  (congestive) heart failure ([place])     Comment:  a. 05/2016 Echo: EF 30-35%; b. 11/2016 Echo: EF 45-50%,               diff HK; c. 09/2021 Echo: EF < 20%; d. 11/2021 Echo: EF               <20%, no rwma, Nl RV fxn, sev dil LA, mod-sev MR. mod dil              PA w/ mildly elev PASP. No date: CKD (chronic kidney disease), stage III (HCC) No date: Elevated troponin (chronic) No date: Essential hypertension 11/2013: GI bleed No date: Hyperlipidemia No date: Pancreatitis No date:  Paroxysmal A-fib (HCC)     Comment:  a. new onset s/p unsuccessful TEE/DCCV on 08/16/2014; b.               CHA2DS2VASc = 3-> eliquis (freq noncompliant); c. 02/2021               s/p TEE/DCCV; d. Prev on amio->d/c 2/2 abnl TFTs. No date: Paroxysmal atrial flutter (HCC) No date: Sleep-disordered breathing     Comment:  Has yet to have a sleep study No date: Stroke [facility])  Past Surgical History: 01/09/2015: CARDIAC CATHETERIZATION; N/A     Comment:  Procedure: Left Heart Cath and Coronary Angiography;                Surgeon: [name], MD;  Location: [place] CV              LAB;  Service: Cardiovascular;  Laterality: N/A; 03/05/2021: CARDIOVERSION; N/A     Comment:  Procedure: CARDIOVERSION;  Surgeon: Kate Sable,               MD;  Location: [facility] ORS;  Service: Cardiovascular;                Laterality: N/A; 04/28/2022: CENTRAL  LINE INSERTION     Comment:  Procedure: CENTRAL LINE INSERTION;  Surgeon: Nelva Bush, MD;  Location: [place] CV LAB;                Service: Cardiovascular;; 07/22/2020: COLONOSCOPY; N/A     Comment:  Procedure: COLONOSCOPY;  Surgeon: Tol[place], Benay Pike, MD;              Location: [facility] ENDOSCOPY;  Service: Gastroenterology;                Laterality: N/A; 08/16/2014: ELECTROPHYSIOLOGIC STUDY; N/A     Comment:  Procedure: CARDIOVERSION;  Surgeon: [name],               MD;  Location: [facility] ORS;  Service: Cardiovascular;                Laterality: N/A; 10/10/2015: FLEXIBLE SIGMOIDOSCOPY; N/A     Comment:  Procedure: FLEXIBLE SIGMOIDOSCOPY;  Surgeon: [name], MD;  Location: [facility] ENDOSCOPY;  Service:               Endoscopy;  Laterality: N/A; No date: KNEE SURGERY; Right November 2011: [facility]OVIEW [facility]     Comment:  No ischemia or infarction. EF 50-55% ( no improvement               from 2009 Myoview EF of 28% 04/28/2022: RIGHT HEART CATH; N/A     Comment:  Procedure: RIGHT HEART CATH;  Surgeon: Nelva Bush,              MD;  Location: [place] [facility] LAB;  Service:               Cardiovascular;  Laterality: N/A; 08/16/2014: TEE WITHOUT CARDIOVERSION; N/A     Comment:  Procedure: TRANSESOPHAGEAL ECHOCARDIOGRAM (TEE);                Surgeon: [name], MD;  Location: [facility] ORS;                Service: Cardiovascular;  Laterality: N/A; 03/05/2021: TEE WITHOUT CARDIOVERSION;  N/A     Comment:  Procedure: TRANSESOPHAGEAL ECHOCARDIOGRAM (TEE);                Surgeon: Kate Sable, MD;  Location: [facility] ORS;                Service: Cardiovascular;  Laterality: N/A;  BMI    Body Mass Index: 32.13 kg/m      Reproductive/Obstetrics negative OB ROS                             Anesthesia Physical Anesthesia Plan  ASA: 4  Anesthesia Plan: General   Post-op Pain Management:    Induction:  Intravenous  PONV Risk Score and Plan: Propofol infusion and TIVA  Airway Management Planned: Natural Airway and Nasal Cannula  Additional Equipment:   Intra-op Plan:   Post-operative Plan:   Informed Consent: I have reviewed the patients History and Physical, chart, labs and discussed the procedure including the risks, benefits and alternatives for the proposed anesthesia with the patient or authorized representative who has indicated his/her understanding and acceptance.     Dental Advisory Given  Plan Discussed with: Anesthesiologist, CRNA and Surgeon  Anesthesia Plan Comments: (Patient consented for risks of anesthesia including but not limited to:  - adverse reactions to medications - risk of airway placement if required - damage to eyes, teeth, lips or other oral mucosa - nerve damage due to positioning  - sore throat or hoarseness - Damage to heart, brain, nerves, lungs, other parts of body or loss of life  Patient voiced understanding.)        Anesthesia Quick Evaluation

## 2022-05-01 NOTE — Progress Notes (Signed)
*  PRELIMINARY RESULTS* Echocardiogram Echocardiogram Transesophageal has been performed.  Hector [name] 05/01/2022, 1:21 PM

## 2022-05-01 NOTE — Anesthesia Postprocedure Evaluation (Signed)
Anesthesia Post Note  Patient: Hector [name]  Procedure(s) Performed: TRANSESOPHAGEAL ECHOCARDIOGRAM  Patient location during evaluation: Spec[facility] Recovery Anesthesia Type: General Level of consciousness: awake and alert Pain management: pain level controlled Vital Signs Assessment: post-procedure vital signs reviewed and stable Respiratory status: spontaneous breathing, nonlabored ventilation, respiratory function stable and patient connected to nasal cannula oxygen Cardiovascular status: blood pressure returned to baseline and stable Postop Assessment: no apparent nausea or vomiting Anesthetic complications: no   No notable events documented.   Last Vitals:  Vitals:   05/01/22 1308 05/01/22 1315  BP: 117/66 101/77  Pulse: 73 65  Resp: (!) 22 20  Temp:    SpO2: 96% 96%    Last Pain:  Vitals:   05/01/22 1212  TempSrc: Oral  PainSc: 0-No pain                 Ilene Qua

## 2022-05-01 NOTE — Interval H&P Note (Signed)
History and Physical Interval Note:  05/01/2022 12:41 PM  Hector [name]  has presented today for surgery, with the diagnosis of atrial fibrillation.  The various methods of treatment have been discussed with the patient and family. After consideration of risks, benefits and other options for treatment, the patient has consented to  Procedure(s) with comments: TRANSESOPHAGEAL ECHOCARDIOGRAM (N/A) - TEE with cardioversion as a surgical intervention.  The patient's history has been reviewed, patient examined, no change in status, stable for surgery.  I have reviewed the patient's chart and labs.  Questions were answered to the patient's satisfaction.     Lemma Tetro [employer]

## 2022-05-01 NOTE — Progress Notes (Signed)
Progress Note   Patient: Hector [name] [id number] DOB: 06-08-1965 DOA: 04/25/2022     5 DOS: the patient was seen and examined on 05/01/2022   Brief hospital course: Taken from H&P.   Hector [name] is a 57 y.o. male with medical history significant for A. fib on Eliquis, pancreatitis, sCHF with EF < 20%, CKD-IV, HTN, HLD, stroke, hypothyroidism, alcohol abuse in remission, GI bleeding, OSA, anemia, hospitalized 2 months ago with RSV who presents to the ED with cough and shortness of breath for the past 2 days as well as abdominal distention.  ED course and data review: BP 141/126 with heart rate in the low 100s.  Respirations 25-26 with O2 sat on room mid to high 90s.  Labs notable for troponin of 48, BNP pending.  CBC with normal BBC count platelets 1 38,000., respiratory viral panel negative.  Creatinine near baseline at 3.5, potassium 6.2, sodium 132.  Hepatic function panel with total bilirubin 4.8. EKG,.Reviewed and interpreted showing sinus at 106 with nonspecific ST-T wave changes chest x-ray shows.Findings suspicious for developing pulmonary edema. Patient treated with IV Lasix for CHF, given a dose of calcium gluconate and Veltassa for hyperkalemia.  3/3: Vitals stable.  Hyperkalemia has been resolved with potassium of 4.6, Veltassa discontinued.  Patient was on potassium supplement at home which should be discontinued. BNP elevated at 736, patient also has CKD stage IV.  Troponin mildly positive with a flat curve.  RUQ ultrasound with cholelithiasis and gallbladder sludge without complicating factors.  Findings also suspicious for hepatic cirrhosis.  Patient quit drinking 13 years ago. Worsening T. bili and INR.  Concern of cardiohepatic and cardiorenal. Clinically appears euvolemic. Cardiology and nephrology were consulted.  Addendum.  Cardiology is advising holding amiodarone, increasing the dose of carvedilol and switching Eliquis with heparin.  Patient might need right  heart cath to see the pressures.  They were also recommending involving GI as his liver abnormalities does not really match with hepatic congestion.  Message sent to Dr.Vanga.  3/4: Hemodynamically stable.  Worsening renal function, nephrology is advising holding diuresis.  Clinically appears euvolemic but significant worsening of BNP in 1 day.  CK has been normalized.  T. bili seems stable with slight worsening of INR to 2. Patient might need milrinone for concern of low output heart failure, might need to go under advanced heart failure team, cardiology to decide regarding transfer.  3/5; vitals stable.  Renal function continued to get worse.  Sodium at 128 most likely due to worsening renal function.  Going for right heart catheterization for further investigation of his low output decompensated heart failure.  GI also recommended EGD to rule out any peptic ulcer disease causing his epigastric pain and if that is negative they were recommending general surgery evaluation for cholecystectomy as prior HIDA scan done last year with low EF.  They also started him on Creon for history of atrophic pancreas.  3/6: Right heart cath with low output heart failure and severely elevated pressures.  Patient was transferred to ICU and started on milrinone with IV diuresis.  Renal functions currently stable, net negative of -4 L over the past 24 hours.  Abdominal pain seems improving.  3/7: Vital stable with borderline blood pressure.  UOP recorded for as above 5 L with total net negative of -8605.  CVP 5-6, second dose of Lasix was held by cardiology.  Renal function seems stable.  Cardiology is planning DCCV tomorrow as he remained in A-fib.  Advanced  heart failure team restarted amiodarone as elevated T. bili and mild transaminitis is most likely due to hepatic congestion with acute on chronic CHF.  Hepatic function today with mild transaminitis with AST of 52, ALT 63 and T. bili stable at 4.6.  3/8:  Hemodynamically stable with mildly elevated heart rate, remained in A-fib-going for DCCV today.  Currently on amiodarone.  Milrinone was stopped today, Lasix will be held due to normal CVP at 6.  Slight increase in creatinine to 3.61 with stable GFR at 19.  UOP of 3300 recorded with net negative of -10,770.  Most likely be converted to torsemide per heart failure team for discharge.  Patient is very high risk for deterioration and mortality based on advance heart failure, worsening renal function and other underlying comorbidities.  Assessment and Plan: * Acute on chronic systolic CHF (congestive heart failure) (HCC) Nonischemic cardiomyopathy, EF less than 20% not previously a candidate for ICD due to noncompliance Patient presents with shortness of breath, abdominal distention, with chest x-ray suspicious for developing pulmonary edema Troponin elevated to 48 but patient denies chest pain and EKG nonacute.  ACS not suspected Suspect medication noncompliance based on fill history for medication Holding Lasix and metolazone with worsening renal function. Continue carvedilol  Daily weights with intake and output monitoring Last echo October 2023 showed EF less than 20% Patient was previously deemed not to be ICD insertion by EP due to noncompliance  Patient was last seen by his cardiologist Dr. Haroldine Laws in December 2023 at which time metolazone was added to his diuretic regimen, weight was 247 pounds at the time Right heart catheterization on 3/5 with low output heart failure and severely elevated pressures.  Patient was transferred to ICU and started on milrinone with IV diuresis.  Cardiology is planning to stop milrinone today, 3/8 and holding IV diuresis for another day as CVP remained low.  Most likely with starting on torsemide from tomorrow. -Daily BMP and weight -Strict intake and output -Beta-blocker was held due to low output heart failure  Nonischemic cardiomyopathy: EF 20-25% Please  see above  Hyperkalemia Potassium was 6.2 on admission which has been improved. Received calcium gluconate and Veltassa in the ED Patient was on potassium supplement at home which need to be discontinued. Worsening renal function can be contributory. -Discontinuing Veltassa and keep monitoring  Atrial fibrillation with RVR (HCC) Heart rate in low 100.  Has had DC cardioversion in the past Continue amiodarone and carvedilol -Going for DCCV today. -Patient will remain on heparin infusion and most likely be going back to home Eliquis after the procedure  Hyponatremia Clinically appears euvolemic with elevated BNP based on renal function. Can be due to diuretic use.  Sodium at 128 today which is little worsened then 132 -Continue to monitor  Chronic renal impairment, stage 4 (severe) (HCC) AKI with history of CKD stage IV.  Creatinine at 3.61 today with baseline around 3 -Currently being diuresed with milrinone and Lasix -Concern of cardiorenal -Nephrology consult  Hypothyroidism Continue levothyroxine  Type II diabetes mellitus with renal manifestations ([place]) Hold Invokana due to worsening renal function Sliding scale insulin coverage  Hyperbilirubinemia Bilirubin chronically elevated around 2.5 but now 4.8>>5.4 . INR 1.7 suspect related to hepatic congestion from CHF Had a HIDA scan July 2023 for bilirubin around 3 that showed "Abnormally low gallbladder ejection fraction equal to 6% indicative of decreased gallbladder emptying and gallbladder dysfunction" Renal ultrasound with concern of cirrhosis. GI was consulted and patient might need EGD and  if that is normal they were recommending surgical evaluation.  Thrombocytopenia (HCC) Mild thrombocytopenia which seems chronic.  -Continue to monitor  Alcohol use, unspecified, in remission Patient does not currently drink, per patient he quit drinking 13 years ago  History of GI bleed No acute concerns    Subjective:  Patient with no new concerns today.  Going for cardioversion.  Physical Exam: Vitals:   05/01/22 0500 05/01/22 0600 05/01/22 1000 05/01/22 1212  BP: (!) 110/90  (!) 112/95 115/83  Pulse:  (!) 106 (!) 55 99  Resp: (!) 22 20 (!) 23 20  Temp:   98.6 F (37 C) 98.8 F (37.1 C)  TempSrc:   Oral Oral  SpO2: 93% 93% 96% 93%  Weight:  104.5 kg    Height:       General.  Ill-appearing gentleman, in no acute distress. Pulmonary.  Lungs clear bilaterally, normal respiratory effort. CV.  Regular rate and rhythm, no JVD, rub or murmur. Abdomen.  Soft, nontender, nondistended, BS positive. CNS.  Alert and oriented .  No focal neurologic deficit. Extremities.  No edema, no cyanosis, pulses intact and symmetrical. Psychiatry.  Judgment and insight appears normal.   Data Reviewed: Prior data reviewed  Family Communication: Discussed with patient  Disposition: Status is: Inpatient Remains inpatient appropriate because: Severity of illness  Planned Discharge Destination: Home  DVT prophylaxis.  Heparin Time spent: 44 minutes  This record has been created using Systems analyst. Errors have been sought and corrected,but may not always be located. Such creation errors do not reflect on the standard of care.   Author: Lorella Nimrod, MD 05/01/2022 1:08 PM  For on call review [email or web address].

## 2022-05-02 DIAGNOSIS — I428 Other cardiomyopathies: Secondary | ICD-10-CM | POA: Diagnosis not present

## 2022-05-02 DIAGNOSIS — I5023 Acute on chronic systolic (congestive) heart failure: Secondary | ICD-10-CM | POA: Diagnosis not present

## 2022-05-02 DIAGNOSIS — N184 Chronic kidney disease, stage 4 (severe): Secondary | ICD-10-CM | POA: Diagnosis not present

## 2022-05-02 DIAGNOSIS — I4891 Unspecified atrial fibrillation: Secondary | ICD-10-CM | POA: Diagnosis not present

## 2022-05-02 LAB — COMPREHENSIVE METABOLIC PANEL
ALT: 49 U/L — ABNORMAL HIGH (ref 0–44)
AST: 24 U/L (ref 15–41)
Albumin: 3.4 g/dL — ABNORMAL LOW (ref 3.5–5.0)
Alkaline Phosphatase: 105 U/L (ref 38–126)
Anion gap: 12 (ref 5–15)
BUN: 70 mg/dL — ABNORMAL HIGH (ref 6–20)
CO2: 23 mmol/L (ref 22–32)
Calcium: 9.1 mg/dL (ref 8.9–10.3)
Chloride: 95 mmol/L — ABNORMAL LOW (ref 98–111)
Creatinine, Ser: 3.33 mg/dL — ABNORMAL HIGH (ref 0.61–1.24)
GFR, Estimated: 21 mL/min — ABNORMAL LOW (ref >60)
Glucose, Bld: 133 mg/dL — ABNORMAL HIGH (ref 70–99)
Potassium: 4.6 mmol/L (ref 3.5–5.1)
Sodium: 130 mmol/L — ABNORMAL LOW (ref 135–145)
Total Bilirubin: 2.4 mg/dL — ABNORMAL HIGH (ref 0.3–1.2)
Total Protein: 7.2 g/dL (ref 6.5–8.1)

## 2022-05-02 LAB — CBC
HCT: 38.8 % — ABNORMAL LOW (ref 39.0–52.0)
Hemoglobin: 12.7 g/dL — ABNORMAL LOW (ref 13.0–17.0)
MCH: 27.5 pg (ref 26.0–34.0)
MCHC: 32.7 g/dL (ref 30.0–36.0)
MCV: 84 fL (ref 80.0–100.0)
Platelets: 238 10*3/uL (ref 150–400)
RBC: 4.62 MIL/uL (ref 4.22–5.81)
RDW: 22 % — ABNORMAL HIGH (ref 11.5–15.5)
WBC: 10.1 10*3/uL (ref 4.0–10.5)
nRBC: 0 % (ref 0.0–0.2)

## 2022-05-02 LAB — MAGNESIUM: Magnesium: 2.3 mg/dL (ref 1.7–2.4)

## 2022-05-02 LAB — HEPARIN LEVEL (UNFRACTIONATED): Heparin Unfractionated: 0.49 IU/mL (ref 0.30–0.70)

## 2022-05-02 MED ORDER — APIXABAN 5 MG PO TABS
5.0000 mg | ORAL_TABLET | Freq: Two times a day (BID) | ORAL | Status: DC
  Administered 2022-05-02: 5 mg via ORAL
  Filled 2022-05-02: qty 1

## 2022-05-02 NOTE — TOC Progression Note (Signed)
Transition of Care [facility]) - Progression Note    Patient Details  Name: Hector [name] MRN: [id number] Date of Birth: Feb 11, 1966  Transition of Care [facility]) CM/SW [place], [place] Phone Number: 05/02/2022, 8:35 AM  Clinical Narrative:     TOC continues to follow for needs.   PCP is Rutherford Guys, MD.  Brother transports him to appointments.  [place] or [place] on [address] if it is the weekend. No issues obtaining medications.   Patient is from home with brother. No home health or DME use prior to admission. He does follow up with Paramedicine. No further concerns.   Brother or brother's girlfriend will transport him home at discharge.   Expected Discharge Plan: Home/Self Care Barriers to Discharge: Continued Medical Work up  Expected Discharge Plan and Services     Post Acute Care Choice: NA Living arrangements for the past 2 months: Single Family Home                                       Social Determinants of Health (SDOH) Interventions SDOH Screenings   Food Insecurity: No Food Insecurity (02/25/2022)  Housing: Low Risk  (02/25/2022)  Transportation Needs: No Transportation Needs (02/25/2022)  Utilities: Not At Risk (02/25/2022)  Depression (PHQ2-9): Low Risk  (08/19/2021)  Financial Resource Strain: Low Risk  (01/04/2019)  Physical Activity: Inactive (01/04/2019)  Social Connections: Moderately Isolated (01/04/2019)  Stress: No Stress Concern Present (01/04/2019)  Tobacco Use: Medium Risk (04/29/2022)    Readmission Risk Interventions    04/27/2022   11:48 AM 02/24/2022    3:53 PM 12/09/2021    1:09 PM  Readmission Risk Prevention Plan  Transportation Screening Complete Complete Complete  Medication Review Press photographer) Complete Complete Complete  PCP or Specialist appointment within 3-5 days of discharge Complete  Complete  SW Recovery Care/Counseling Consult Complete Complete Complete  Palliative Care Screening Not  Applicable Not Applicable Not [place] Not Applicable Not Applicable Not Applicable

## 2022-05-02 NOTE — Progress Notes (Signed)
ANTICOAGULATION CONSULT NOTE  Pharmacy Consult for IV heparin Indication: atrial fibrillation  Allergies  Allergen Reactions   Esomeprazole Magnesium Other (See Comments)    Suspected interstitial nephritis 2018   Egg-Derived Products Rash    Patient Measurements: Height: '5\' 11"'$  (180.3 cm) Weight: 104.9 kg (231 lb 4.2 oz) IBW/kg (Calculated) : 75.3 Heparin Dosing Weight: 99.2 kg  Vital Signs: Temp: 98.2 F (36.8 C) (03/09 0403) BP: 107/85 (03/09 0403) Pulse Rate: 71 (03/09 0403)  Labs: Recent Labs    04/29/22 1028 04/29/22 2026 04/30/22 0425 05/01/22 0459 05/01/22 1403 05/02/22 0535  HGB  --    < > 12.2* 12.2*  --  12.7*  HCT  --   --  37.3* 38.3*  --  38.8*  PLT  --   --  209 225  --  238  APTT 78*  --   --   --   --   --   LABPROT  --   --   --   --  14.9  --   INR  --   --   --   --  1.2  --   HEPARINUNFRC 0.46   < > 0.31 0.49  --  0.49  CREATININE  --   --  3.51* 3.61*  --  3.33*   < > = values in this interval not displayed.    Estimated Creatinine Clearance: 30.2 mL/min (A) (by C-G formula based on SCr of 3.33 mg/dL (H)).  Medical History: Past Medical History:  Diagnosis Date   Alcohol abuse    Alcoholic cardiomyopathy ([place])    a. 12/2007 MV: EF 28%, no isch;  b. 8/12 Echo: EF 25-35%; c. 02/2014 Echo: EF 20-25%; d. 12/2014 Cath: minimal CAD; e. 01/2015 Echo: EF 50-55%;  d. 05/2016 Echo: EF 30-35%, diff HK, gr2 DD; e. 11/2016 Echo: EF 45-50%, diff HK; f. 09/2021 Echo: EF <20%; g. 11/2021 Echo: EF<20%.   Chronic combined systolic (congestive) and diastolic (congestive) heart failure ([place])    a. 05/2016 Echo: EF 30-35%; b. 11/2016 Echo: EF 45-50%, diff HK; c. 09/2021 Echo: EF < 20%; d. 11/2021 Echo: EF <20%, no rwma, Nl RV fxn, sev dil LA, mod-sev MR. mod dil PA w/ mildly elev PASP.   CKD (chronic kidney disease), stage III (HCC)    Elevated troponin (chronic)    Essential hypertension    GI bleed 11/2013   Hyperlipidemia    Pancreatitis    Paroxysmal A-fib  (HCC)    a. new onset s/p unsuccessful TEE/DCCV on 08/16/2014; b. CHA2DS2VASc = 3-> eliquis (freq noncompliant); c. 02/2021 s/p TEE/DCCV; d. Prev on amio->d/c 2/2 abnl TFTs.   Paroxysmal atrial flutter (HCC)    Sleep-disordered breathing    Has yet to have a sleep study   Stroke [facility])     Medications:  Apixaban 5 mg BID PTA  Assessment: 57 year old male being admitted with acute on chronic systolic CHF. PMH includes Afib on Eliquis prior to admission, pancreatitis, sCHF with EF < 20%, CKD-IV, HTN, HLD, stroke, hypothyroidism, alcohol abuse in remission, GI bleeding, OSA, anemia, hospitalized 2 months ago with RSV.  Pharmacy has been consulted to start IV heparin due to possible procedure H&H stable.   Last dose Eliquis 3/3 AM '@0905'$  (> 8 hours since last dose)  Goal of Therapy:  aPTT 66-102 seconds HL goal 0.3-0.7   Monitor platelets by anticoagulation protocol: Yes   Results: 3/4 @ 0259:  aPTT = 139,  HL = > 1.10  3/4 '@1321'$  aPTT = 79, Therapeutic x 1, heparin rate 1200 units/hr 3/4 @ 2142 aPTT = 54, subtherapeutic 3/5 @ 0712 aPTT = 123, HL = 0.95, SUPRAtherapeutic, rate 1500 units/hr 3/6 @ 0102 aPTT = 54, subtherapeutic 3/6 @ 1028 aPTT = 78, HL = 0.46, therapeutic and correlating  Date Time aPTT/HL Rate/Comment 3/6 1028 78 / 0.46 Therapeutic + correlating / 1400 u/hr 3/6 2026 HL 0.31 Therapeutic x 2 3/7 0425 HL 0.31 Therapeutic x 3 3/8 0459 HL 0.49 Therapeutic x 4 3/9 0535 HL 0.49 Therapeutic x 5  Plan:  Continue heparin at current rate of 1400 un/hr Check heparin level tomorrow morning with AM labs Continue to monitor H&H and platelets daily while on heparin gtt.  Renda Rolls, PharmD, [facility] 05/02/2022 6:22 AM

## 2022-05-02 NOTE — Progress Notes (Signed)
[place] for conversion of IV heparin to Apixaban Indication: atrial fibrillation  Allergies  Allergen Reactions   Esomeprazole Magnesium Other (See Comments)    Suspected interstitial nephritis 2018   Egg-Derived Products Rash    Patient Measurements: Height: '5\' 11"'$  (180.3 cm) Weight: 104.9 kg (231 lb 4.2 oz) IBW/kg (Calculated) : 75.3 Heparin Dosing Weight: 99.2 kg  Vital Signs: Temp: 98.2 F (36.8 C) (03/09 0403) BP: 120/98 (03/09 0757) Pulse Rate: 61 (03/09 0757)  Labs: Recent Labs    04/30/22 0425 05/01/22 0459 05/01/22 1403 05/02/22 0535  HGB 12.2* 12.2*  --  12.7*  HCT 37.3* 38.3*  --  38.8*  PLT 209 225  --  238  LABPROT  --   --  14.9  --   INR  --   --  1.2  --   HEPARINUNFRC 0.31 0.49  --  0.49  CREATININE 3.51* 3.61*  --  3.33*    Estimated Creatinine Clearance: 30.2 mL/min (A) (by C-G formula based on SCr of 3.33 mg/dL (H)).  Medical History: Past Medical History:  Diagnosis Date   Alcohol abuse    Alcoholic cardiomyopathy ([place])    a. 12/2007 MV: EF 28%, no isch;  b. 8/12 Echo: EF 25-35%; c. 02/2014 Echo: EF 20-25%; d. 12/2014 Cath: minimal CAD; e. 01/2015 Echo: EF 50-55%;  d. 05/2016 Echo: EF 30-35%, diff HK, gr2 DD; e. 11/2016 Echo: EF 45-50%, diff HK; f. 09/2021 Echo: EF <20%; g. 11/2021 Echo: EF<20%.   Chronic combined systolic (congestive) and diastolic (congestive) heart failure ([place])    a. 05/2016 Echo: EF 30-35%; b. 11/2016 Echo: EF 45-50%, diff HK; c. 09/2021 Echo: EF < 20%; d. 11/2021 Echo: EF <20%, no rwma, Nl RV fxn, sev dil LA, mod-sev MR. mod dil PA w/ mildly elev PASP.   CKD (chronic kidney disease), stage III (HCC)    Elevated troponin (chronic)    Essential hypertension    GI bleed 11/2013   Hyperlipidemia    Pancreatitis    Paroxysmal A-fib (HCC)    a. new onset s/p unsuccessful TEE/DCCV on 08/16/2014; b. CHA2DS2VASc = 3-> eliquis (freq noncompliant); c. 02/2021 s/p TEE/DCCV; d. Prev on amio->d/c  2/2 abnl TFTs.   Paroxysmal atrial flutter (HCC)    Sleep-disordered breathing    Has yet to have a sleep study   Stroke [facility])     Medications:  Apixaban 5 mg BID PTA  Assessment: 57 year old male being admitted with acute on chronic systolic CHF. PMH includes Afib on Eliquis prior to admission, pancreatitis, sCHF with EF < 20%, CKD-IV, HTN, HLD, stroke, hypothyroidism, alcohol abuse in remission, GI bleeding, OSA, anemia, hospitalized 2 months ago with RSV.  Pharmacy has been consulted to start IV heparin due to possible procedure H&H stable.   Last dose Eliquis 3/3 AM '@0905'$  (> 8 hours since last dose)  Goal of Therapy:  aPTT 66-102 seconds HL goal 0.3-0.7   Monitor platelets by anticoagulation protocol: Yes    Plan:  Stop heparin drip Restart apixaban 5 mg po BID CBC at least every 72 hours  Tollie Eth III, PharmD 05/02/2022 3:19 PM

## 2022-05-02 NOTE — Progress Notes (Signed)
Progress Note   Patient: Hector [name] [id number] DOB: 1965/12/17 DOA: 04/25/2022     6 DOS: the patient was seen and examined on 05/02/2022   Brief hospital course: Taken from H&P.   Hector [name] is a 57 y.o. male with medical history significant for A. fib on Eliquis, pancreatitis, sCHF with EF < 20%, CKD-IV, HTN, HLD, stroke, hypothyroidism, alcohol abuse in remission, GI bleeding, OSA, anemia, hospitalized 2 months ago with RSV who presents to the ED with cough and shortness of breath for the past 2 days as well as abdominal distention.  ED course and data review: BP 141/126 with heart rate in the low 100s.  Respirations 25-26 with O2 sat on room mid to high 90s.  Labs notable for troponin of 48, BNP pending.  CBC with normal BBC count platelets 1 38,000., respiratory viral panel negative.  Creatinine near baseline at 3.5, potassium 6.2, sodium 132.  Hepatic function panel with total bilirubin 4.8. EKG,.Reviewed and interpreted showing sinus at 106 with nonspecific ST-T wave changes chest x-ray shows.Findings suspicious for developing pulmonary edema. Patient treated with IV Lasix for CHF, given a dose of calcium gluconate and Veltassa for hyperkalemia.  3/3: Vitals stable.  Hyperkalemia has been resolved with potassium of 4.6, Veltassa discontinued.  Patient was on potassium supplement at home which should be discontinued. BNP elevated at 736, patient also has CKD stage IV.  Troponin mildly positive with a flat curve.  RUQ ultrasound with cholelithiasis and gallbladder sludge without complicating factors.  Findings also suspicious for hepatic cirrhosis.  Patient quit drinking 13 years ago. Worsening T. bili and INR.  Concern of cardiohepatic and cardiorenal. Clinically appears euvolemic. Cardiology and nephrology were consulted.  Addendum.  Cardiology is advising holding amiodarone, increasing the dose of carvedilol and switching Eliquis with heparin.  Patient might need right  heart cath to see the pressures.  They were also recommending involving GI as his liver abnormalities does not really match with hepatic congestion.  Message sent to Dr.Vanga.  3/4: Hemodynamically stable.  Worsening renal function, nephrology is advising holding diuresis.  Clinically appears euvolemic but significant worsening of BNP in 1 day.  CK has been normalized.  T. bili seems stable with slight worsening of INR to 2. Patient might need milrinone for concern of low output heart failure, might need to go under advanced heart failure team, cardiology to decide regarding transfer.  3/5; vitals stable.  Renal function continued to get worse.  Sodium at 128 most likely due to worsening renal function.  Going for right heart catheterization for further investigation of his low output decompensated heart failure.  GI also recommended EGD to rule out any peptic ulcer disease causing his epigastric pain and if that is negative they were recommending general surgery evaluation for cholecystectomy as prior HIDA scan done last year with low EF.  They also started him on Creon for history of atrophic pancreas.  3/6: Right heart cath with low output heart failure and severely elevated pressures.  Patient was transferred to ICU and started on milrinone with IV diuresis.  Renal functions currently stable, net negative of -4 L over the past 24 hours.  Abdominal pain seems improving.  3/7: Vital stable with borderline blood pressure.  UOP recorded for as above 5 L with total net negative of -8605.  CVP 5-6, second dose of Lasix was held by cardiology.  Renal function seems stable.  Cardiology is planning DCCV tomorrow as he remained in A-fib.  Advanced  heart failure team restarted amiodarone as elevated T. bili and mild transaminitis is most likely due to hepatic congestion with acute on chronic CHF.  Hepatic function today with mild transaminitis with AST of 52, ALT 63 and T. bili stable at 4.6.  3/8:  Hemodynamically stable with mildly elevated heart rate, remained in A-fib-going for DCCV today.  Currently on amiodarone.  Milrinone was stopped today, Lasix will be held due to normal CVP at 6.  Slight increase in creatinine to 3.61 with stable GFR at 19.  UOP of 3300 recorded with net negative of -10,770.  Most likely be converted to torsemide per heart failure team for discharge.  3/9: Patient was successfully converted to sinus rhythm yesterday.  Remained in sinus.  Cardiology to decide about diuresis.  Converting heparin with Eliquis today  Patient is very high risk for deterioration and mortality based on advance heart failure, worsening renal function and other underlying comorbidities.  Assessment and Plan: * Acute on chronic systolic CHF (congestive heart failure) (HCC) Nonischemic cardiomyopathy, EF less than 20% not previously a candidate for ICD due to noncompliance Patient presents with shortness of breath, abdominal distention, with chest x-ray suspicious for developing pulmonary edema Troponin elevated to 48 but patient denies chest pain and EKG nonacute.  ACS not suspected Suspect medication noncompliance based on fill history for medication Holding Lasix and metolazone with worsening renal function. Continue carvedilol  Daily weights with intake and output monitoring Last echo October 2023 showed EF less than 20% Patient was previously deemed not to be ICD insertion by EP due to noncompliance  Patient was last seen by his cardiologist Dr. Haroldine Laws in December 2023 at which time metolazone was added to his diuretic regimen, weight was 247 pounds at the time Right heart catheterization on 3/5 with low output heart failure and severely elevated pressures.  Patient was transferred to ICU and started on milrinone with IV diuresis.  Milrinone was discontinued on 3/8.  IV diuresis was held due to low CVP.  Cardiology to decide about starting on torsemide -Daily BMP and weight -Strict  intake and output -Beta-blocker was held due to low output heart failure  Nonischemic cardiomyopathy: EF 20-25% Please see above  Hyperkalemia Potassium was 6.2 on admission which has been improved. Received calcium gluconate and Veltassa in the ED Patient was on potassium supplement at home which need to be discontinued. Worsening renal function can be contributory. -Discontinuing Veltassa and keep monitoring  Atrial fibrillation with RVR (HCC) S/p successful DCCV and restoration of normal rhythm. -Converting heparin infusion with home Eliquis -Cardiology to decide about amiodarone  Hyponatremia Clinically appears euvolemic with elevated BNP based on renal function. Can be due to diuretic use.  Sodium at 128 today which is little worsened then 132 -Continue to monitor  Chronic renal impairment, stage 4 (severe) (HCC) AKI with history of CKD stage IV.  Creatinine at 3.61 today with baseline around 3 -Currently being diuresed with milrinone and Lasix -Concern of cardiorenal -Nephrology consult  Hypothyroidism Continue levothyroxine  Type II diabetes mellitus with renal manifestations ([place]) Hold Invokana due to worsening renal function Sliding scale insulin coverage  Hyperbilirubinemia Bilirubin chronically elevated around 2.5 but now 4.8>>5.4 . INR 1.7 suspect related to hepatic congestion from CHF Had a HIDA scan July 2023 for bilirubin around 3 that showed "Abnormally low gallbladder ejection fraction equal to 6% indicative of decreased gallbladder emptying and gallbladder dysfunction" Renal ultrasound with concern of cirrhosis. GI was consulted and patient might need EGD and  if that is normal they were recommending surgical evaluation.  Thrombocytopenia (HCC) Mild thrombocytopenia which seems chronic.  -Continue to monitor  Alcohol use, unspecified, in remission Patient does not currently drink, per patient he quit drinking 13 years ago  History of GI bleed No  acute concerns    Subjective: Patient seen and examined today.  No new complaints.  Still having some right upper quadrant pain but much improved than before.  Physical Exam: Vitals:   05/01/22 2002 05/01/22 2318 05/02/22 0403 05/02/22 0757  BP: 107/84 110/85 107/85 (!) 120/98  Pulse: 69 72 71 61  Resp: '20 20 18   '$ Temp: 97.9 F (36.6 C) 98 F (36.7 C) 98.2 F (36.8 C)   TempSrc:      SpO2: 96% 92% 97% 99%  Weight:   104.9 kg   Height:       General.     In no acute distress. Pulmonary.  Lungs clear bilaterally, normal respiratory effort. CV.  Regular rate and rhythm, no JVD, rub or murmur. Abdomen.  Soft, nontender, nondistended, BS positive. CNS.  Alert and oriented .  No focal neurologic deficit. Extremities.  No edema, no cyanosis, pulses intact and symmetrical. Psychiatry.  Judgment and insight appears normal.   Data Reviewed: Prior data reviewed  Family Communication: Discussed with patient  Disposition: Status is: Inpatient Remains inpatient appropriate because: Severity of illness  Planned Discharge Destination: Home  DVT prophylaxis.  Heparin Time spent: 45 minutes  This record has been created using Systems analyst. Errors have been sought and corrected,but may not always be located. Such creation errors do not reflect on the standard of care.   Author: Lorella Nimrod, MD 05/02/2022 3:11 PM  For on call review [email or web address].

## 2022-05-02 NOTE — Progress Notes (Signed)
[name], male   DOB: 02-May-1965, 57 y.o.   MRN: [id number]    Cardiology Rounding Note  PCP-Cardiologist: Ida Rogue, MD  57 year old gentleman with nonischemic cardiomyopathy EF less than 20% 10/23 moderate-severe MR thought to be contributed to by heavy alcohol use; catheterization 2016 minimal coronary disease admitted with acute heart failure treated with milrinone found to be in atrial fibrillation and underwent DC cardioversion Diuresed with furosemide and metolazone both now on hold secondary to worsening renal insufficiency   Subjective:    Without chest pain.  Shortness of breath is improved  He remains on amiodarone 30 mg/hr and heparin gtt, he is in atrial fibrillation rate 90s-100s>:> TEE DCCV 3/9   Creatinine mildly higher, 3.51 => 3.61.=>> 3.33     Objective:   Weight Range: 104.9 kg Body mass index is 32.25 kg/m.   Vital Signs:   Temp:  [97.9 F (36.6 C)-98.2 F (36.8 C)] 98.2 F (36.8 C) (03/09 0403) Pulse Rate:  [61-76] 61 (03/09 0757) Resp:  [18-29] 18 (03/09 0403) BP: (107-120)/(84-98) 120/98 (03/09 0757) SpO2:  [92 %-99 %] 99 % (03/09 0757) Weight:  [104.9 kg] 104.9 kg (03/09 0403) Last BM Date : 04/28/22  Weight change: Filed Weights   04/30/22 0724 05/01/22 0600 05/02/22 0403  Weight: 107.2 kg 104.5 kg 104.9 kg    Intake/Output:   Intake/Output Summary (Last 24 hours) at 05/02/2022 1542 Last data filed at 05/02/2022 1506 Gross per 24 hour  Intake 1757.15 ml  Output 2025 ml  Net -267.85 ml       Physical Exam  Well developed and nourished in no acute distress HENT normal Neck supple  CVP in place Clear Regular rate and rhythm, no murmurs or gallops Abd-soft with active BS No Clubbing cyanosis edema Skin-warm and dry A & Oriented  Grossly normal sensory and motor function  ECG     Telemetry   sinus    Labs    CBC Recent Labs    05/01/22 0459 05/02/22 0535  WBC 8.7 10.1  HGB 12.2* 12.7*  HCT  38.3* 38.8*  MCV 84.9 84.0  PLT 225 [id number]    Basic Metabolic Panel Recent Labs    05/01/22 0459 05/02/22 0535  NA 131* 130*  K 4.2 4.6  CL 92* 95*  CO2 28 23  GLUCOSE 113* 133*  BUN 69* 70*  CREATININE 3.61* 3.33*  CALCIUM 8.9 9.1  MG 2.2 2.3    Liver Function Tests Recent Labs    04/30/22 0425 05/02/22 0535  AST 52* 24  ALT 63* 49*  ALKPHOS 111 105  BILITOT 4.6* 2.4*  PROT 6.9 7.2  ALBUMIN 3.3* 3.4*    No results for input(s): "LIPASE", "AMYLASE" in the last 72 hours. Cardiac Enzymes No results for input(s): "CKTOTAL", "CKMB", "CKMBINDEX", "TROPONINI" in the last 72 hours.  BNP: BNP (last 3 results) Recent Labs    02/23/22 0920 03/23/22 0642 04/26/22 1646  BNP 609.5* 736.5* 2,109.1*       Imaging    No results found.   Medications:     Scheduled Medications:  apixaban  5 mg Oral BID   Chlorhexidine Gluconate Cloth  6 each Topical Daily   insulin aspart  0-5 Units Subcutaneous QHS   insulin aspart  0-9 Units Subcutaneous TID WC   levothyroxine  100 mcg Oral Q0600   lipase/protease/amylase  36,000 Units Oral TID WC   mupirocin ointment  1 Application Nasal BID   polyethylene glycol  34 g  Oral BID   sodium chloride flush  3 mL Intravenous Q12H   sodium chloride flush  3 mL Intravenous Q12H   spironolactone  12.5 mg Oral Daily    Infusions:  sodium chloride     amiodarone 30 mg/hr (05/02/22 1443)    PRN Medications: sodium chloride, acetaminophen **OR** acetaminophen, ondansetron **OR** ondansetron (ZOFRAN) IV, mouth rinse, oxyCODONE, sodium chloride flush   Assessment/Plan  Congestive heart failure-acute/chronic  Cardiomyopathy-nonischemic  Atrial fibrillation-persistent status post TEE guided cardioversion  Acute/chronic kidney disease Estimated Creatinine Clearance: 30.2 mL/min (A) (by C-G formula based on SCr of 3.33 mg/dL (H)).  Stage IIIb-IV  Hyper transaminasemia improving   The patient underwent cardioversion  yesterday  Renal function is better today.  Transaminases are also better today  Will transition amiodarone from IV--p.o. in the morning  Transition heparin--Eliquis    Inotropic dependent heart failure precludes beta-blockers at this juncture will defer    Virl Axe, MD  05/02/2022, 3:42 PM  Advanced Heart Failure Team Pager [phone] (M-F; 7a - 5p)  Please contact [facility] for night-coverage after hours (5p -7a ) and weekends on [email or web address]

## 2022-05-02 NOTE — Progress Notes (Signed)
[facility]  PROGRESS NOTE   Subjective:   Patient seen at bedside.  Awake and alert.  Denies any chest pain or shortness of breath.  Objective:  Vital signs: Blood pressure (!) 120/98, pulse 61, temperature 98.2 F (36.8 C), resp. rate 18, height '5\' 11"'$  (1.803 m), weight 104.9 kg, SpO2 99 %.  Intake/Output Summary (Last 24 hours) at 05/02/2022 1529 Last data filed at 05/02/2022 1506 Gross per 24 hour  Intake 1757.15 ml  Output 2025 ml  Net -267.85 ml   Filed Weights   04/30/22 0724 05/01/22 0600 05/02/22 0403  Weight: 107.2 kg 104.5 kg 104.9 kg     Physical Exam: General:  No acute distress  Head:  Normocephalic, atraumatic. Moist oral mucosal membranes  Eyes:  Anicteric  Neck:  Supple  Lungs:   Clear to auscultation, normal effort  Heart:  S1S2 no rubs  Abdomen:   Soft, nontender, bowel sounds present  Extremities:  peripheral edema.  Neurologic:  Awake, alert, following commands  Skin:  No lesions  Access:     Basic Metabolic Panel: Recent Labs  Lab 04/25/22 1821 04/25/22 2328 04/28/22 0552 04/28/22 1605 04/28/22 1616 04/29/22 0357 04/30/22 0425 05/01/22 0459 05/02/22 0535  NA 132*   < > 128*   < > 134* 134* 134* 131* 130*  K 6.2*   < > 3.8   < > 3.9 3.6 3.8 4.2 4.6  CL 97*   < > 94*  --   --  97* 96* 92* 95*  CO2 25   < > 26  --   --  '25 27 28 23  '$ GLUCOSE 123*   < > 115*  --   --  169* 110* 113* 133*  BUN 60*   < > 69*  --   --  69* 66* 69* 70*  CREATININE 3.15*   < > 3.69*  --   --  3.61* 3.51* 3.61* 3.33*  CALCIUM 8.6*   < > 8.1*  --   --  8.2* 8.7* 8.9 9.1  MG 2.5*  --   --   --   --  2.5* 2.2 2.2 2.3   < > = values in this interval not displayed.   GFR: Estimated Creatinine Clearance: 30.2 mL/min (A) (by C-G formula based on SCr of 3.33 mg/dL (H)).  Liver Function Tests: Recent Labs  Lab 04/26/22 0824 04/27/22 0259 04/28/22 0552 04/30/22 0425 05/02/22 0535  AST '23 23 21 '$ 52* 24  ALT '16 18 20 '$ 63* 49*  ALKPHOS 93 88 93 111 105   BILITOT 5.4* 5.1* 4.6* 4.6* 2.4*  PROT 6.8 6.5 6.5 6.9 7.2  ALBUMIN 3.5 3.1* 3.1* 3.3* 3.4*   Recent Labs  Lab 04/25/22 1821  LIPASE 24   No results for input(s): "AMMONIA" in the last 168 hours.  CBC: Recent Labs  Lab 04/27/22 0259 04/28/22 1605 04/28/22 1616 04/29/22 0357 04/30/22 0425 05/01/22 0459 05/02/22 0535  WBC 8.2  --   --  8.3 7.8 8.7 10.1  HGB 11.8*   < > 12.6* 11.6* 12.2* 12.2* 12.7*  HCT 36.2*   < > 37.0* 36.1* 37.3* 38.3* 38.8*  MCV 84.2  --   --  84.9 84.6 84.9 84.0  PLT 230  --   --  194 209 225 238   < > = values in this interval not displayed.     HbA1C: Hgb A1c MFr Bld  Date/Time Value Ref Range Status  01/07/2022 04:09 AM 6.8 (H) 4.8 -  5.6 % Final    Comment:    (NOTE) Pre diabetes:          5.7%-6.4%  Diabetes:              >6.4%  Glycemic control for   <7.0% adults with diabetes   11/19/2021 04:07 AM 7.2 (H) 4.8 - 5.6 % Final    Comment:    (NOTE) Pre diabetes:          5.7%-6.4%  Diabetes:              >6.4%  Glycemic control for   <7.0% adults with diabetes     Urinalysis: No results for input(s): "COLORURINE", "LABSPEC", "PHURINE", "GLUCOSEU", "HGBUR", "BILIRUBINUR", "KETONESUR", "PROTEINUR", "UROBILINOGEN", "NITRITE", "LEUKOCYTESUR" in the last 72 hours.  Invalid input(s): "APPERANCEUR"    Imaging: ECHO TEE  Result Date: 05/01/2022    TRANSESOPHOGEAL ECHO REPORT   Patient Name:   KERIM MATZKE Date of Exam: 05/01/2022 Medical Rec #:  [id number]           Height:       71.0 in Accession #:    [id number]          Weight:       230.4 lb Date of Birth:  [birth year]           BSA:          2.239 m Patient Age:    47 years            BP:           115/83 mmHg Patient Gender: M                   HR:           99 bpm. Exam Location:  [facility] Procedure: Transesophageal Echo, Cardiac Doppler and Color Doppler Indications:     Not listed on TEE check-in sheet  History:         Patient has prior history of Echocardiogram examinations,  most                  recent 12/08/2021. Stroke; Risk Factors:Hypertension. PAF,                  alcohol abuse.  Sonographer:     Sherrie Sport Referring Phys:  [place] Diagnosing Phys: Franki Monte PROCEDURE: The transesophogeal probe was passed without difficulty through the esophogus of the patient. Sedation performed by different physician. The patient developed no complications during the procedure.  IMPRESSIONS  1. No LV thrombus. Left ventricular ejection fraction, by estimation, is <20%. The left ventricle has severely decreased function. The left ventricular internal cavity size was mildly dilated.  2. Right ventricular systolic function is moderately reduced. The right ventricular size is normal.  3. Smoke but no thrombus in LA appendage. Left atrial size was moderately dilated. No left atrial/left atrial appendage thrombus was detected.  4. Right atrial size was mildly dilated.  5. Functional mitral regurgitation with restricted posterior leaflet. The mitral valve is abnormal. Moderate mitral valve regurgitation. No evidence of mitral stenosis.  6. The aortic valve is tricuspid. Aortic valve regurgitation is not visualized. No aortic stenosis is present.  7. Normal caliber thoracic aorta with minimal plaque.  8. No PFO or ASD by color doppler. FINDINGS  Left Ventricle: No LV thrombus. Left ventricular ejection fraction, by estimation, is <20%. The left ventricle has severely decreased function. The left ventricular internal cavity size was  mildly dilated. There is no left ventricular hypertrophy. Right Ventricle: The right ventricular size is normal. No increase in right ventricular wall thickness. Right ventricular systolic function is moderately reduced. Left Atrium: Smoke but no thrombus in LA appendage. Left atrial size was moderately dilated. No left atrial/left atrial appendage thrombus was detected. Right Atrium: Right atrial size was mildly dilated. Pericardium: There is no evidence  of pericardial effusion. Mitral Valve: Functional mitral regurgitation with restricted posterior leaflet. The mitral valve is abnormal. Moderate mitral valve regurgitation. No evidence of mitral valve stenosis. Tricuspid Valve: The tricuspid valve is normal in structure. Tricuspid valve regurgitation is trivial. Aortic Valve: The aortic valve is tricuspid. Aortic valve regurgitation is not visualized. No aortic stenosis is present. Pulmonic Valve: The pulmonic valve was normal in structure. Pulmonic valve regurgitation is not visualized. Aorta: Normal caliber thoracic aorta with minimal plaque. The aortic root is normal in size and structure. IAS/Shunts: No PFO or ASD by color doppler. [employer] Electronically signed by Franki Monte Signature Date/Time: 05/01/2022/3:37:20 PM    Final      Medications:    sodium chloride     amiodarone 30 mg/hr (05/02/22 1443)    apixaban  5 mg Oral BID   Chlorhexidine Gluconate Cloth  6 each Topical Daily   insulin aspart  0-5 Units Subcutaneous QHS   insulin aspart  0-9 Units Subcutaneous TID WC   levothyroxine  100 mcg Oral Q0600   lipase/protease/amylase  36,000 Units Oral TID WC   mupirocin ointment  1 Application Nasal BID   polyethylene glycol  34 g Oral BID   sodium chloride flush  3 mL Intravenous Q12H   sodium chloride flush  3 mL Intravenous Q12H   spironolactone  12.5 mg Oral Daily    Assessment/ Plan:     57 y.o. male with past medical conditions including hypertension, paroxysmal A-fib on Eliquis, CHF with a EF less than 20%, and chronic kidney disease stage IV. Patient presents to the emergency department with lower extremity edema and shortness of breath. He has been admitted for Hyperkalemia [E87.5] CHF exacerbation (HCC) [I50.9] Stage 4 chronic kidney disease ([place]) [N18.4] Acute on chronic congestive heart failure, unspecified heart failure type ([place]) [I50.9]  #1: Acute kidney injury on CKD: Patient now has CKD stage IV with a GFR  of close to 20 cc/min.  Presently furosemide and metolazone are on hold.  Will continue the small dose of spironolactone at this time.  #2: Hyperkalemia: Hyperkalemia has improved.  #3: Congestive heart failure: Significantly improved with Lasix spironolactone and metolazone.  Patient is presently on fluid restriction and also spironolactone.  #4: Diabetes: Will continue the insulin protocol for now.  #5: Hyponatremia: Hyponatremia most likely secondary to fluid overload leading to dilutional hyponatremia which has now improved.  Will continue to monitor closely.    LOS: [place], [facility] 3/9/20243:29 PM

## 2022-05-03 DIAGNOSIS — R1013 Epigastric pain: Secondary | ICD-10-CM | POA: Diagnosis not present

## 2022-05-03 DIAGNOSIS — K7030 Alcoholic cirrhosis of liver without ascites: Secondary | ICD-10-CM | POA: Diagnosis not present

## 2022-05-03 DIAGNOSIS — I509 Heart failure, unspecified: Secondary | ICD-10-CM | POA: Diagnosis not present

## 2022-05-03 LAB — COMPREHENSIVE METABOLIC PANEL
ALT: 38 U/L (ref 0–44)
AST: 18 U/L (ref 15–41)
Albumin: 3.4 g/dL — ABNORMAL LOW (ref 3.5–5.0)
Alkaline Phosphatase: 106 U/L (ref 38–126)
Anion gap: 9 (ref 5–15)
BUN: 72 mg/dL — ABNORMAL HIGH (ref 6–20)
CO2: 26 mmol/L (ref 22–32)
Calcium: 8.8 mg/dL — ABNORMAL LOW (ref 8.9–10.3)
Chloride: 98 mmol/L (ref 98–111)
Creatinine, Ser: 3.25 mg/dL — ABNORMAL HIGH (ref 0.61–1.24)
GFR, Estimated: 21 mL/min — ABNORMAL LOW (ref >60)
Glucose, Bld: 135 mg/dL — ABNORMAL HIGH (ref 70–99)
Potassium: 4.4 mmol/L (ref 3.5–5.1)
Sodium: 133 mmol/L — ABNORMAL LOW (ref 135–145)
Total Bilirubin: 2 mg/dL — ABNORMAL HIGH (ref 0.3–1.2)
Total Protein: 7 g/dL (ref 6.5–8.1)

## 2022-05-03 LAB — CBC
HCT: 38.4 % — ABNORMAL LOW (ref 39.0–52.0)
Hemoglobin: 12.6 g/dL — ABNORMAL LOW (ref 13.0–17.0)
MCH: 27.8 pg (ref 26.0–34.0)
MCHC: 32.8 g/dL (ref 30.0–36.0)
MCV: 84.8 fL (ref 80.0–100.0)
Platelets: 231 10*3/uL (ref 150–400)
RBC: 4.53 MIL/uL (ref 4.22–5.81)
RDW: 22.1 % — ABNORMAL HIGH (ref 11.5–15.5)
WBC: 7.5 10*3/uL (ref 4.0–10.5)
nRBC: 0 % (ref 0.0–0.2)

## 2022-05-03 LAB — MAGNESIUM: Magnesium: 2.7 mg/dL — ABNORMAL HIGH (ref 1.7–2.4)

## 2022-05-03 MED ORDER — APIXABAN 5 MG PO TABS
5.0000 mg | ORAL_TABLET | Freq: Two times a day (BID) | ORAL | Status: DC
  Administered 2022-05-03 – 2022-05-04 (×3): 5 mg via ORAL
  Filled 2022-05-03 (×3): qty 1

## 2022-05-03 MED ORDER — FAMOTIDINE 20 MG PO TABS
20.0000 mg | ORAL_TABLET | Freq: Every day | ORAL | Status: DC
  Administered 2022-05-03 – 2022-05-04 (×2): 20 mg via ORAL
  Filled 2022-05-03 (×2): qty 1

## 2022-05-03 MED ORDER — AMIODARONE HCL 200 MG PO TABS
400.0000 mg | ORAL_TABLET | Freq: Two times a day (BID) | ORAL | Status: DC
  Administered 2022-05-03 – 2022-05-04 (×3): 400 mg via ORAL
  Filled 2022-05-03 (×3): qty 2

## 2022-05-03 MED ORDER — TORSEMIDE 20 MG PO TABS
20.0000 mg | ORAL_TABLET | Freq: Every day | ORAL | Status: DC
  Administered 2022-05-03 – 2022-05-04 (×2): 20 mg via ORAL
  Filled 2022-05-03 (×2): qty 1

## 2022-05-03 MED ORDER — APIXABAN 5 MG PO TABS: 5.0000 mg | ORAL_TABLET | Freq: Two times a day (BID) | ORAL | Status: DC

## 2022-05-03 NOTE — Progress Notes (Signed)
Hector Darby, MD [address]  [place]  [place], [address] [address]  Main: [phone]  Fax: [phone] Pager: [phone]   Subjective: No acute events overnight.  Patient reports mild epigastric discomfort only.  He is tolerating p.o. well and reports having bowel movements.  I was informed by Dr. Reesa Chew that patient is stable from cardiac standpoint, recently underwent cardioversion as inpatient and if EGD still needs to be performed as inpatient versus outpatient   Objective: Vital signs in last 24 hours: Vitals:   05/03/22 0355 05/03/22 0902 05/03/22 0904 05/03/22 1134  BP:  (!) 121/101 (!) 121/101 109/84  Pulse:  67 70 63  Resp:  '16 18 18  '$ Temp:      TempSrc:      SpO2:  98% 100% 100%  Weight: 104.8 kg     Height:       Weight change: -0.1 kg  Intake/Output Summary (Last 24 hours) at 05/03/2022 1205 Last data filed at 05/03/2022 0424 Gross per 24 hour  Intake 1068.82 ml  Output 750 ml  Net 318.82 ml     Exam: Heart:: Regular rate and rhythm, S1S2 present, or without murmur or extra heart sounds Lungs: clear to auscultation Abdomen: soft, nontender, normal bowel sounds   Lab Results:    Latest Ref Rng & Units 05/03/2022    6:20 AM 05/02/2022    5:35 AM 05/01/2022    4:59 AM  CBC  WBC 4.0 - 10.5 K/uL 7.5  10.1  8.7   Hemoglobin 13.0 - 17.0 g/dL 12.6  12.7  12.2   Hematocrit 39.0 - 52.0 % 38.4  38.8  38.3   Platelets 150 - 400 K/uL 231  238  225       Latest Ref Rng & Units 05/03/2022    6:20 AM 05/02/2022    5:35 AM 05/01/2022    4:59 AM  CMP  Glucose 70 - 99 mg/dL 135  133  113   BUN 6 - 20 mg/dL 72  70  69   Creatinine 0.61 - 1.24 mg/dL 3.25  3.33  3.61   Sodium 135 - 145 mmol/L 133  130  131   Potassium 3.5 - 5.1 mmol/L 4.4  4.6  4.2   Chloride 98 - 111 mmol/L 98  95  92   CO2 22 - 32 mmol/L '26  23  28   '$ Calcium 8.9 - 10.3 mg/dL 8.8  9.1  8.9   Total Protein 6.5 - 8.1 g/dL 7.0  7.2    Total Bilirubin 0.3 - 1.2 mg/dL 2.0  2.4    Alkaline  Phos 38 - 126 U/L 106  105    AST 15 - 41 U/L 18  24    ALT 0 - 44 U/L 38  49      Micro Results: Recent Results (from the past 240 hour(s))  Resp panel by RT-PCR (RSV, Flu A&B, Covid) Anterior Nasal Swab     Status: None   Collection Time: 04/25/22  8:39 PM   Specimen: Anterior Nasal Swab  Result Value Ref Range Status   SARS Coronavirus 2 by RT PCR NEGATIVE NEGATIVE Final    Comment: (NOTE) SARS-CoV-2 target nucleic acids are NOT DETECTED.  The SARS-CoV-2 RNA is generally detectable in upper respiratory specimens during the acute phase of infection. The lowest concentration of SARS-CoV-2 viral copies this assay can detect is 138 copies/mL. A negative result does not preclude SARS-Cov-2 infection and should not be used  as the sole basis for treatment or other patient management decisions. A negative result may occur with  improper specimen collection/handling, submission of specimen other than nasopharyngeal swab, presence of viral mutation(s) within the areas targeted by this assay, and inadequate number of viral copies(<138 copies/mL). A negative result must be combined with clinical observations, patient history, and epidemiological information. The expected result is Negative.  Fact Sheet for Patients:  EntrepreneurPulse.com.au  Fact Sheet for Healthcare Providers:  IncredibleEmployment.be  This test is no t yet approved or cleared by the [place] FDA and  has been authorized for detection and/or diagnosis of SARS-CoV-2 by FDA under an Emergency Use Authorization (EUA). This EUA will remain  in effect (meaning this test can be used) for the duration of the COVID-19 declaration under Section 564(b)(1) of the Act, 21 U.S.C.section 360bbb-3(b)(1), unless the authorization is terminated  or revoked sooner.       Influenza A by PCR NEGATIVE NEGATIVE Final   Influenza B by PCR NEGATIVE NEGATIVE Final    Comment: (NOTE) The Xpert  Xpress SARS-CoV-2/FLU/RSV plus assay is intended as an aid in the diagnosis of influenza from Nasopharyngeal swab specimens and should not be used as a sole basis for treatment. Nasal washings and aspirates are unacceptable for Xpert Xpress SARS-CoV-2/FLU/RSV testing.  Fact Sheet for Patients: EntrepreneurPulse.com.au  Fact Sheet for Healthcare Providers: IncredibleEmployment.be  This test is not yet approved or cleared by the [place] FDA and has been authorized for detection and/or diagnosis of SARS-CoV-2 by FDA under an Emergency Use Authorization (EUA). This EUA will remain in effect (meaning this test can be used) for the duration of the COVID-19 declaration under Section 564(b)(1) of the Act, 21 U.S.C. section 360bbb-3(b)(1), unless the authorization is terminated or revoked.     Resp Syncytial Virus by PCR NEGATIVE NEGATIVE Final    Comment: (NOTE) Fact Sheet for Patients: EntrepreneurPulse.com.au  Fact Sheet for Healthcare Providers: IncredibleEmployment.be  This test is not yet approved or cleared by the [place] FDA and has been authorized for detection and/or diagnosis of SARS-CoV-2 by FDA under an Emergency Use Authorization (EUA). This EUA will remain in effect (meaning this test can be used) for the duration of the COVID-19 declaration under Section 564(b)(1) of the Act, 21 U.S.C. section 360bbb-3(b)(1), unless the authorization is terminated or revoked.  Performed at [facility], [address] [place], [place] [address]   MRSA Next Gen by PCR, Nasal     Status: Abnormal   Collection Time: 04/28/22  5:37 PM   Specimen: Nasal Mucosa; Nasal Swab  Result Value Ref Range Status   MRSA by PCR Next Gen DETECTED (A) NOT DETECTED Final    Comment: CRITICAL RESULT CALLED TO, READ BACK BY AND VERIFIED WITH: RN MARCEL TURNER ON 04/28/22 '@1940'$  RP (NOTE) The GeneXpert MRSA Assay  (FDA approved for NASAL specimens only), is one component of a comprehensive MRSA colonization surveillance program. It is not intended to diagnose MRSA infection nor to guide or monitor treatment for MRSA infections. Test performance is not FDA approved in patients less than 90 years old. Performed at [facility], [address] [place], [place] [address]    Studies/Results: ECHO TEE  Result Date: 05/01/2022    TRANSESOPHOGEAL ECHO REPORT   Patient Name:   NASHTON KWASNIEWSKI Date of Exam: 05/01/2022 Medical Rec #:  [id number]           Height:       71.0 in Accession #:  [id number]          Weight:       230.4 lb Date of Birth:  [birth year]           BSA:          2.239 m Patient Age:    58 years            BP:           115/83 mmHg Patient Gender: M                   HR:           99 bpm. Exam Location:  [facility] Procedure: Transesophageal Echo, Cardiac Doppler and Color Doppler Indications:     Not listed on TEE check-in sheet  History:         Patient has prior history of Echocardiogram examinations, most                  recent 12/08/2021. Stroke; Risk Factors:Hypertension. PAF,                  alcohol abuse.  Sonographer:     Sherrie Sport Referring Phys:  [place] Diagnosing Phys: Franki Monte PROCEDURE: The transesophogeal probe was passed without difficulty through the esophogus of the patient. Sedation performed by different physician. The patient developed no complications during the procedure.  IMPRESSIONS  1. No LV thrombus. Left ventricular ejection fraction, by estimation, is <20%. The left ventricle has severely decreased function. The left ventricular internal cavity size was mildly dilated.  2. Right ventricular systolic function is moderately reduced. The right ventricular size is normal.  3. Smoke but no thrombus in LA appendage. Left atrial size was moderately dilated. No left atrial/left atrial appendage thrombus was detected.  4. Right atrial size was mildly  dilated.  5. Functional mitral regurgitation with restricted posterior leaflet. The mitral valve is abnormal. Moderate mitral valve regurgitation. No evidence of mitral stenosis.  6. The aortic valve is tricuspid. Aortic valve regurgitation is not visualized. No aortic stenosis is present.  7. Normal caliber thoracic aorta with minimal plaque.  8. No PFO or ASD by color doppler. FINDINGS  Left Ventricle: No LV thrombus. Left ventricular ejection fraction, by estimation, is <20%. The left ventricle has severely decreased function. The left ventricular internal cavity size was mildly dilated. There is no left ventricular hypertrophy. Right Ventricle: The right ventricular size is normal. No increase in right ventricular wall thickness. Right ventricular systolic function is moderately reduced. Left Atrium: Smoke but no thrombus in LA appendage. Left atrial size was moderately dilated. No left atrial/left atrial appendage thrombus was detected. Right Atrium: Right atrial size was mildly dilated. Pericardium: There is no evidence of pericardial effusion. Mitral Valve: Functional mitral regurgitation with restricted posterior leaflet. The mitral valve is abnormal. Moderate mitral valve regurgitation. No evidence of mitral valve stenosis. Tricuspid Valve: The tricuspid valve is normal in structure. Tricuspid valve regurgitation is trivial. Aortic Valve: The aortic valve is tricuspid. Aortic valve regurgitation is not visualized. No aortic stenosis is present. Pulmonic Valve: The pulmonic valve was normal in structure. Pulmonic valve regurgitation is not visualized. Aorta: Normal caliber thoracic aorta with minimal plaque. The aortic root is normal in size and structure. IAS/Shunts: No PFO or ASD by color doppler. [employer] Electronically signed by Franki Monte Signature Date/Time: 05/01/2022/3:37:20 PM    Final    Medications: I have reviewed the patient's current medications. Prior to Admission:  Medications  Prior to Admission  Medication Sig Dispense Refill Last Dose   allopurinol (ZYLOPRIM) 100 MG tablet Take 0.5 tablets (50 mg total) by mouth daily. 15 tablet 3 Past Week at Unknown   amiodarone (PACERONE) 200 MG tablet Take 1 tablet (200 mg total) by mouth 2 (two) times daily. 60 tablet 3 Past Week at Unknown   apixaban (ELIQUIS) 5 MG TABS tablet Take 1 tablet (5 mg total) by mouth 2 (two) times daily. 60 tablet 6 Past Month at Unknown   atorvastatin (LIPITOR) 80 MG tablet Take 1 tablet (80 mg total) by mouth daily. 90 tablet 11 Unknown at Unknown   carvedilol (COREG) 6.25 MG tablet Take 1 tablet (6.25 mg total) by mouth 2 (two) times daily with a meal. 60 tablet 3 Unknown at Unknown   ferrous gluconate (FERGON) 324 MG tablet Take 324 mg by mouth daily with breakfast.      levothyroxine (SYNTHROID) 100 MCG tablet Take 1 tablet (100 mcg total) by mouth daily before breakfast. 30 tablet 11 Past Week at Unknown   potassium chloride SA (KLOR-CON M) 20 MEQ tablet Take 1 tablet (20 mEq total) by mouth daily. Take an extra 2 tablets on Mon and Fri with metolazone. (Patient taking differently: Take 20 mEq by mouth daily.) 46 tablet 11 Unknown at Unknown   torsemide (DEMADEX) 20 MG tablet Take 3 tablets (60 mg total) by mouth daily. (Patient taking differently: Take 40 mg by mouth daily.) 180 tablet 0 Past Week at Unknown   canagliflozin (INVOKANA) 100 MG TABS tablet Take 1 tablet (100 mg total) by mouth daily before breakfast. (Patient not taking: Reported on 04/26/2022) 30 tablet 1 Not Taking   metolazone (ZAROXOLYN) 2.5 MG tablet Take 1 tablet (2.5 mg total) by mouth daily. (Patient not taking: Reported on 04/26/2022) 10 tablet 3 Not Taking   traMADol (ULTRAM) 50 MG tablet Take 1 tablet (50 mg total) by mouth every 6 (six) hours as needed. (Patient not taking: Reported on 04/26/2022) 20 tablet 0 Not Taking   Scheduled:  amiodarone  400 mg Oral BID   apixaban  5 mg Oral BID   Chlorhexidine Gluconate Cloth  6  each Topical Daily   insulin aspart  0-5 Units Subcutaneous QHS   insulin aspart  0-9 Units Subcutaneous TID WC   levothyroxine  100 mcg Oral Q0600   lipase/protease/amylase  36,000 Units Oral TID WC   mupirocin ointment  1 Application Nasal BID   polyethylene glycol  34 g Oral BID   sodium chloride flush  3 mL Intravenous Q12H   sodium chloride flush  3 mL Intravenous Q12H   spironolactone  12.5 mg Oral Daily   torsemide  20 mg Oral Daily   Continuous:  sodium chloride     [id number] chloride, acetaminophen **OR** acetaminophen, ondansetron **OR** ondansetron (ZOFRAN) IV, mouth rinse, oxyCODONE, sodium chloride flush Anti-infectives (From admission, onward)    None      Scheduled Meds:  amiodarone  400 mg Oral BID   apixaban  5 mg Oral BID   Chlorhexidine Gluconate Cloth  6 each Topical Daily   insulin aspart  0-5 Units Subcutaneous QHS   insulin aspart  0-9 Units Subcutaneous TID WC   levothyroxine  100 mcg Oral Q0600   lipase/protease/amylase  36,000 Units Oral TID WC   mupirocin ointment  1 Application Nasal BID   polyethylene glycol  34 g Oral BID   sodium chloride flush  3 mL Intravenous Q12H   sodium  chloride flush  3 mL Intravenous Q12H   spironolactone  12.5 mg Oral Daily   torsemide  20 mg Oral Daily   Continuous Infusions:  sodium chloride     PRN Meds:.sodium chloride, acetaminophen **OR** acetaminophen, ondansetron **OR** ondansetron (ZOFRAN) IV, mouth rinse, oxyCODONE, sodium chloride flush   Assessment: Principal Problem:   Acute on chronic systolic CHF (congestive heart failure) (HCC) Active Problems:   Paroxysmal A-fib (HCC)   Nonischemic cardiomyopathy: EF 20-25%   Alcohol use, unspecified, in remission   Chronic renal impairment, stage 4 (severe) (HCC)   Hypothyroidism   Atrial fibrillation with RVR (HCC)   Hyponatremia   Type II diabetes mellitus with renal manifestations (HCC)   Hyperkalemia   Hyperbilirubinemia   Thrombocytopenia (HCC)    History of GI bleed   Abdominal pain, epigastric   Alcoholic cirrhosis of liver without ascites ([place])  56 year old gentleman with nonischemic cardiomyopathy EF less than 20% 10/23 moderate-severe MR thought to be contributed to by heavy alcohol use; catheterization 2016 minimal coronary disease admitted with acute heart failure treated with milrinone found to be in atrial fibrillation and underwent DC cardioversion.  GI is consulted for epigastric pain, hyperbilirubinemia and cirrhosis of liver  Plan: Epigastric pain:  Pain has improved with acid suppressive therapy as well as initiation of Creon Will proceed with upper endoscopy tomorrow given patient's history of cirrhosis and to rule out any underlying peptic ulcer disease as patient will be on Eliquis for history of A-fib Recommend to restart Pepcid 20 mg daily and continue Creon Okay to continue Eliquis given recent cardioversion N.p.o. effective 5 AM tomorrow and patient is agreeable to undergo upper endoscopy  I have discussed alternative options, risks & benefits,  which include, but are not limited to, bleeding, infection, perforation,respiratory complication & drug reaction.  The patient agrees with this plan & written consent will be obtained.    Isolated hyperbilirubinemia In setting of acute on chronic CHF, improving  ?  Cirrhosis of liver Follow-up with GI as outpatient    LOS: 7 days   Gardenia Witter 05/03/2022, 12:05 PM

## 2022-05-03 NOTE — Progress Notes (Signed)
Progress Note   Patient: Hector [name] [id number] DOB: [birth year] DOA: 04/25/2022     7 DOS: the patient was seen and examined on 05/03/2022   Brief hospital course: Taken from H&P.   Hector [name] is a 57 y.o. male with medical history significant for A. fib on Eliquis, pancreatitis, sCHF with EF < 20%, CKD-IV, HTN, HLD, stroke, hypothyroidism, alcohol abuse in remission, GI bleeding, OSA, anemia, hospitalized 2 months ago with RSV who presents to the ED with cough and shortness of breath for the past 2 days as well as abdominal distention.  ED course and data review: BP 141/126 with heart rate in the low 100s.  Respirations 25-26 with O2 sat on room mid to high 90s.  Labs notable for troponin of 48, BNP pending.  CBC with normal BBC count platelets 1 38,000., respiratory viral panel negative.  Creatinine near baseline at 3.5, potassium 6.2, sodium 132.  Hepatic function panel with total bilirubin 4.8. EKG,.Reviewed and interpreted showing sinus at 106 with nonspecific ST-T wave changes chest x-ray shows.Findings suspicious for developing pulmonary edema. Patient treated with IV Lasix for CHF, given a dose of calcium gluconate and Veltassa for hyperkalemia.  3/3: Vitals stable.  Hyperkalemia has been resolved with potassium of 4.6, Veltassa discontinued.  Patient was on potassium supplement at home which should be discontinued. BNP elevated at 736, patient also has CKD stage IV.  Troponin mildly positive with a flat curve.  RUQ ultrasound with cholelithiasis and gallbladder sludge without complicating factors.  Findings also suspicious for hepatic cirrhosis.  Patient quit drinking 13 years ago. Worsening T. bili and INR.  Concern of cardiohepatic and cardiorenal. Clinically appears euvolemic. Cardiology and nephrology were consulted.  Addendum.  Cardiology is advising holding amiodarone, increasing the dose of carvedilol and switching Eliquis with heparin.  Patient might need right  heart cath to see the pressures.  They were also recommending involving GI as his liver abnormalities does not really match with hepatic congestion.  Message sent to Dr.Vanga.  3/4: Hemodynamically stable.  Worsening renal function, nephrology is advising holding diuresis.  Clinically appears euvolemic but significant worsening of BNP in 1 day.  CK has been normalized.  T. bili seems stable with slight worsening of INR to 2. Patient might need milrinone for concern of low output heart failure, might need to go under advanced heart failure team, cardiology to decide regarding transfer.  3/5; vitals stable.  Renal function continued to get worse.  Sodium at 128 most likely due to worsening renal function.  Going for right heart catheterization for further investigation of his low output decompensated heart failure.  GI also recommended EGD to rule out any peptic ulcer disease causing his epigastric pain and if that is negative they were recommending general surgery evaluation for cholecystectomy as prior HIDA scan done last year with low EF.  They also started him on Creon for history of atrophic pancreas.  3/6: Right heart cath with low output heart failure and severely elevated pressures.  Patient was transferred to ICU and started on milrinone with IV diuresis.  Renal functions currently stable, net negative of -4 L over the past 24 hours.  Abdominal pain seems improving.  3/7: Vital stable with borderline blood pressure.  UOP recorded for as above 5 L with total net negative of -8605.  CVP 5-6, second dose of Lasix was held by cardiology.  Renal function seems stable.  Cardiology is planning DCCV tomorrow as he remained in A-fib.  Advanced  heart failure team restarted amiodarone as elevated T. bili and mild transaminitis is most likely due to hepatic congestion with acute on chronic CHF.  Hepatic function today with mild transaminitis with AST of 52, ALT 63 and T. bili stable at 4.6.  3/8:  Hemodynamically stable with mildly elevated heart rate, remained in A-fib-going for DCCV today.  Currently on amiodarone.  Milrinone was stopped today, Lasix will be held due to normal CVP at 6.  Slight increase in creatinine to 3.61 with stable GFR at 19.  UOP of 3300 recorded with net negative of -10,770.  Most likely be converted to torsemide per heart failure team for discharge.  3/9: Patient was successfully converted to sinus rhythm yesterday.  Remained in sinus.  Cardiology to decide about diuresis.  Converting heparin with Eliquis today.  3/10: Vitals stable.  Renal function seems stable with some improvement of creatinine to 3.25.  Transaminitis resolved and T. bili improving, at 2 today.  Nephrology added 20 mg of torsemide.  Cardiology transitioned him to p.o. amiodarone.  GI is planning EGD tomorrow.  Patient is very high risk for deterioration and mortality based on advance heart failure, worsening renal function and other underlying comorbidities.  Assessment and Plan: * Acute on chronic systolic CHF (congestive heart failure) (HCC) Nonischemic cardiomyopathy, EF less than 20% not previously a candidate for ICD due to noncompliance Initially home diuretics were held due to worsening renal function.   Last echo October 2023 showed EF less than 20% Patient was previously deemed not to be ICD insertion by EP due to noncompliance  Patient was last seen by his cardiologist Dr. Haroldine Laws in December 2023 at which time metolazone was added to his diuretic regimen, weight was 247 pounds at the time Right heart catheterization on 3/5 with low output heart failure and severely elevated pressures.  Patient was transferred to ICU and started on milrinone with IV diuresis.  Milrinone was discontinued on 3/8.  IV diuresis was held due to low CVP.  Nephrology started on 20 mg of torsemide today. -Daily BMP and weight -Strict intake and output -Beta-blocker was held due to low output heart  failure  Nonischemic cardiomyopathy: EF 20-25% Please see above  Hyperkalemia Potassium was 6.2 on admission which has been improved. Received calcium gluconate and Veltassa in the ED Patient was on potassium supplement at home which need to be discontinued. Worsening renal function can be contributory.  Atrial fibrillation with RVR (HCC) S/p successful DCCV and restoration of normal rhythm. -Home Eliquis was restarted yesterday -IV amiodarone is being converted to p.o. today  Hyponatremia Clinically appears euvolemic with elevated BNP based on renal function. Can be due to diuretic use.  Sodium at 128 today which is little worsened then 132 -Continue to monitor  Chronic renal impairment, stage 4 (severe) (HCC) AKI with history of CKD stage IV.  Creatinine at 3.25 today with baseline around 3 Nephrology is on board. -Nephrology started low-dose torsemide today -Continue spironolactone  Hypothyroidism Continue levothyroxine  Type II diabetes mellitus with renal manifestations ([place]) Hold Invokana due to worsening renal function Sliding scale insulin coverage  Hyperbilirubinemia Bilirubin chronically elevated around 2.5 but now 4.8>>5.4 . INR 1.7 suspect related to hepatic congestion from CHF Had a HIDA scan July 2023 for bilirubin around 3 that showed "Abnormally low gallbladder ejection fraction equal to 6% indicative of decreased gallbladder emptying and gallbladder dysfunction" Renal ultrasound with concern of cirrhosis. GI was consulted and patient might need EGD and if that is normal  they were recommending surgical evaluation.  Thrombocytopenia (HCC) Mild thrombocytopenia which seems chronic.  -Continue to monitor  Alcohol use, unspecified, in remission Patient does not currently drink, per patient he quit drinking 13 years ago  History of GI bleed No acute concerns  Subjective: Patient had 1 episode of emesis this morning.  Continue to have some right upper  quadrant discomfort.  No more nausea and vomiting after that.  Physical Exam: Vitals:   05/03/22 0355 05/03/22 0902 05/03/22 0904 05/03/22 1134  BP:  (!) 121/101 (!) 121/101 109/84  Pulse:  67 70 63  Resp:  '16 18 18  '$ Temp:      TempSrc:      SpO2:  98% 100% 100%  Weight: 104.8 kg     Height:       General.  Well-developed gentleman, in no acute distress. Pulmonary.  Lungs clear bilaterally, normal respiratory effort. CV.  Regular rate and rhythm, no JVD, rub or murmur. Abdomen.  Soft, nontender, nondistended, BS positive. CNS.  Alert and oriented .  No focal neurologic deficit. Extremities.  No edema, no cyanosis, pulses intact and symmetrical. Psychiatry.  Judgment and insight appears normal.   Data Reviewed: Prior data reviewed  Family Communication: Discussed with patient  Disposition: Status is: Inpatient Remains inpatient appropriate because: Severity of illness  Planned Discharge Destination: Home  DVT prophylaxis.  Heparin Time spent: 44 minutes  This record has been created using Systems analyst. Errors have been sought and corrected,but may not always be located. Such creation errors do not reflect on the standard of care.   Author: Lorella Nimrod, MD 05/03/2022 2:05 PM  For on call review [email or web address].

## 2022-05-03 NOTE — Progress Notes (Addendum)
[name], male   DOB: 12/31/1965, 57 y.o.   MRN: [id number]    Cardiology Rounding Note  PCP-Cardiologist: Ida Rogue, MD  57 year old gentleman with nonischemic cardiomyopathy EF less than 20% 10/23 moderate-severe MR thought to be contributed to by heavy alcohol use; catheterization 2016 minimal coronary disease Chronic renal insufficiency with Cr About 3 Admitted with acute heart failure treated with milrinone found to be in atrial fibrillation and underwent DC cardioversion Diuresed with furosemide and metolazone both now on hold secondary to worsening renal insufficiency   Subjective:    Without dyspnea or chest pain.  Continues to complain of epigastric discomfort  He remains on amiodarone 30 mg/hr and Apixaban   he is in atrial fibrillation rate 90s-100s>:> TEE DCCV 3/9   Creatinine mildly better, 3.51 => 3.61.=>> 3.33 >>3.26  Plan was for EGD abdominal discomfort  Objective:   Weight Range: 104.8 kg Body mass index is 32.22 kg/m.   Vital Signs:   Temp:  [97.9 F (36.6 C)-98.1 F (36.7 C)] 98.1 F (36.7 C) (03/10 0349) Pulse Rate:  [61-70] 70 (03/10 0904) Resp:  [16-19] 18 (03/10 0904) BP: (112-122)/(81-101) 121/101 (03/10 0904) SpO2:  [98 %-100 %] 100 % (03/10 0904) Weight:  [104.8 kg] 104.8 kg (03/10 0355) Last BM Date : 04/28/22  Weight change: Filed Weights   05/01/22 0600 05/02/22 0403 05/03/22 0355  Weight: 104.5 kg 104.9 kg 104.8 kg    Intake/Output:   Intake/Output Summary (Last 24 hours) at 05/03/2022 0925 Last data filed at 05/03/2022 0424 Gross per 24 hour  Intake 1068.82 ml  Output 1150 ml  Net -81.18 ml       Physical Exam   BP (!) 121/101 (BP Location: Left Arm)   Pulse 70   Temp 98.1 F (36.7 C) (Oral)   Resp 18   Ht '5\' 11"'$  (1.803 m)   Wt 104.8 kg   SpO2 100%   BMI 32.22 kg/m  Well developed and Morbidly obese in no acute distress HENT normal Neck supple with JVP-flat Clear Regular rate and rhythm,  no  gallop No  murmur Abd-soft with active BS epigastric discomfort No Clubbing cyanosis  edema Skin-warm and dry A & Oriented  Grossly normal sensory and motor function      Telemetry   Sinus rhythm    Labs    CBC Recent Labs    05/02/22 0535 05/03/22 0620  WBC 10.1 7.5  HGB 12.7* 12.6*  HCT 38.8* 38.4*  MCV 84.0 84.8  PLT 238 [id number]    Basic Metabolic Panel Recent Labs    05/02/22 0535 05/03/22 0620  NA 130* 133*  K 4.6 4.4  CL 95* 98  CO2 23 26  GLUCOSE 133* 135*  BUN 70* 72*  CREATININE 3.33* 3.25*  CALCIUM 9.1 8.8*  MG 2.3 2.7*    Liver Function Tests Recent Labs    05/02/22 0535 05/03/22 0620  AST 24 18  ALT 49* 38  ALKPHOS 105 106  BILITOT 2.4* 2.0*  PROT 7.2 7.0  ALBUMIN 3.4* 3.4*    No results for input(s): "LIPASE", "AMYLASE" in the last 72 hours. Cardiac Enzymes No results for input(s): "CKTOTAL", "CKMB", "CKMBINDEX", "TROPONINI" in the last 72 hours.  BNP: BNP (last 3 results) Recent Labs    02/23/22 0920 03/23/22 0642 04/26/22 1646  BNP 609.5* 736.5* 2,109.1*       Imaging    No results found.   Medications:     Scheduled Medications:  apixaban  5 mg Oral BID   Chlorhexidine Gluconate Cloth  6 each Topical Daily   insulin aspart  0-5 Units Subcutaneous QHS   insulin aspart  0-9 Units Subcutaneous TID WC   levothyroxine  100 mcg Oral Q0600   lipase/protease/amylase  36,000 Units Oral TID WC   mupirocin ointment  1 Application Nasal BID   polyethylene glycol  34 g Oral BID   sodium chloride flush  3 mL Intravenous Q12H   sodium chloride flush  3 mL Intravenous Q12H   spironolactone  12.5 mg Oral Daily    Infusions:  sodium chloride     amiodarone 30 mg/hr (05/03/22 0106)    PRN Medications: sodium chloride, acetaminophen **OR** acetaminophen, ondansetron **OR** ondansetron (ZOFRAN) IV, mouth rinse, oxyCODONE, sodium chloride flush   Assessment/Plan  Congestive heart  failure-acute/chronic  Cardiomyopathy-nonischemic  Atrial fibrillation-persistent status post TEE guided cardioversion  Acute/chronic kidney disease Estimated Creatinine Clearance: 30.9 mL/min (A) (by C-G formula based on SCr of 3.25 mg/dL (H)).  Stage IIIb-IV  Hyper transaminasemia improving  Abdominal discomfort  Sinus bradycardia   Transition to p.o. amiodarone today  Continue Eliquis.  Cannot discontinue Eliquis in the wake of cardioversion unless emergent.  For EGD tomorrow.  To be done on apixaban.  Euvolemic--continues spironolactone with his GFR above 30.  Might also be a candidate for an SGLT2.  For the heart failure.  Consider BiDil  Renal function minimally better today.    Will follow his heart rate but continue amiodarone for now Inotropic dependent heart failure precludes beta-blockers at this juncture will defer    Virl Axe, MD  05/03/2022, 9:25 AM  Advanced Heart Failure Team Pager [phone] (M-F; 7a - 5p)  Please contact [facility] for night-coverage after hours (5p -7a ) and weekends on [email or web address]

## 2022-05-03 NOTE — Progress Notes (Addendum)
Patient has refused all blood sugar and fall safety precautions, patient educated about importance and risk.

## 2022-05-03 NOTE — Progress Notes (Signed)
[facility]  PROGRESS NOTE   Subjective:     Objective:  Vital signs: Blood pressure (!) 121/101, pulse 70, temperature 98.1 F (36.7 C), temperature source Oral, resp. rate 18, height '5\' 11"'$  (1.803 m), weight 104.8 kg, SpO2 100 %.  Intake/Output Summary (Last 24 hours) at 05/03/2022 1106 Last data filed at 05/03/2022 0424 Gross per 24 hour  Intake 1068.82 ml  Output 1150 ml  Net -81.18 ml   Filed Weights   05/01/22 0600 05/02/22 0403 05/03/22 0355  Weight: 104.5 kg 104.9 kg 104.8 kg     Physical Exam: General:  No acute distress  Head:  Normocephalic, atraumatic. Moist oral mucosal membranes  Eyes:  Anicteric  Neck:  Supple  Lungs:   Clear to auscultation, normal effort  Heart:  S1S2 no rubs  Abdomen:   Soft, nontender, bowel sounds present  Extremities:  peripheral edema.  Neurologic:  Awake, alert, following commands  Skin:  No lesions  Access:     Basic Metabolic Panel: Recent Labs  Lab 04/29/22 0357 04/30/22 0425 05/01/22 0459 05/02/22 0535 05/03/22 0620  NA 134* 134* 131* 130* 133*  K 3.6 3.8 4.2 4.6 4.4  CL 97* 96* 92* 95* 98  CO2 '25 27 28 23 26  '$ GLUCOSE 169* 110* 113* 133* 135*  BUN 69* 66* 69* 70* 72*  CREATININE 3.61* 3.51* 3.61* 3.33* 3.25*  CALCIUM 8.2* 8.7* 8.9 9.1 8.8*  MG 2.5* 2.2 2.2 2.3 2.7*   GFR: Estimated Creatinine Clearance: 30.9 mL/min (A) (by C-G formula based on SCr of 3.25 mg/dL (H)).  Liver Function Tests: Recent Labs  Lab 04/27/22 0259 04/28/22 0552 04/30/22 0425 05/02/22 0535 05/03/22 0620  AST 23 21 52* 24 18  ALT 18 20 63* 49* 38  ALKPHOS 88 93 111 105 106  BILITOT 5.1* 4.6* 4.6* 2.4* 2.0*  PROT 6.5 6.5 6.9 7.2 7.0  ALBUMIN 3.1* 3.1* 3.3* 3.4* 3.4*   No results for input(s): "LIPASE", "AMYLASE" in the last 168 hours. No results for input(s): "AMMONIA" in the last 168 hours.  CBC: Recent Labs  Lab 04/29/22 0357 04/30/22 0425 05/01/22 0459 05/02/22 0535 05/03/22 0620  WBC 8.3 7.8 8.7 10.1  7.5  HGB 11.6* 12.2* 12.2* 12.7* 12.6*  HCT 36.1* 37.3* 38.3* 38.8* 38.4*  MCV 84.9 84.6 84.9 84.0 84.8  PLT 194 209 225 238 231     HbA1C: Hgb A1c MFr Bld  Date/Time Value Ref Range Status  01/07/2022 04:09 AM 6.8 (H) 4.8 - 5.6 % Final    Comment:    (NOTE) Pre diabetes:          5.7%-6.4%  Diabetes:              >6.4%  Glycemic control for   <7.0% adults with diabetes   11/19/2021 04:07 AM 7.2 (H) 4.8 - 5.6 % Final    Comment:    (NOTE) Pre diabetes:          5.7%-6.4%  Diabetes:              >6.4%  Glycemic control for   <7.0% adults with diabetes     Urinalysis: No results for input(s): "COLORURINE", "LABSPEC", "PHURINE", "GLUCOSEU", "HGBUR", "BILIRUBINUR", "KETONESUR", "PROTEINUR", "UROBILINOGEN", "NITRITE", "LEUKOCYTESUR" in the last 72 hours.  Invalid input(s): "APPERANCEUR"    Imaging: ECHO TEE  Result Date: 05/01/2022    TRANSESOPHOGEAL ECHO REPORT   Patient Name:   Hector [name] Date of Exam: 05/01/2022 Medical Rec #:  [id number]  Height:       71.0 in Accession #:    [id number]          Weight:       230.4 lb Date of Birth:  May 22, 1965           BSA:          2.239 m Patient Age:    57 years            BP:           115/83 mmHg Patient Gender: M                   HR:           99 bpm. Exam Location:  [facility] Procedure: Transesophageal Echo, Cardiac Doppler and Color Doppler Indications:     Not listed on TEE check-in sheet  History:         Patient has prior history of Echocardiogram examinations, most                  recent 12/08/2021. Stroke; Risk Factors:Hypertension. PAF,                  alcohol abuse.  Sonographer:     Sherrie Sport Referring Phys:  [place] Diagnosing Phys: Franki Monte PROCEDURE: The transesophogeal probe was passed without difficulty through the esophogus of the patient. Sedation performed by different physician. The patient developed no complications during the procedure.  IMPRESSIONS  1. No LV thrombus. Left  ventricular ejection fraction, by estimation, is <20%. The left ventricle has severely decreased function. The left ventricular internal cavity size was mildly dilated.  2. Right ventricular systolic function is moderately reduced. The right ventricular size is normal.  3. Smoke but no thrombus in LA appendage. Left atrial size was moderately dilated. No left atrial/left atrial appendage thrombus was detected.  4. Right atrial size was mildly dilated.  5. Functional mitral regurgitation with restricted posterior leaflet. The mitral valve is abnormal. Moderate mitral valve regurgitation. No evidence of mitral stenosis.  6. The aortic valve is tricuspid. Aortic valve regurgitation is not visualized. No aortic stenosis is present.  7. Normal caliber thoracic aorta with minimal plaque.  8. No PFO or ASD by color doppler. FINDINGS  Left Ventricle: No LV thrombus. Left ventricular ejection fraction, by estimation, is <20%. The left ventricle has severely decreased function. The left ventricular internal cavity size was mildly dilated. There is no left ventricular hypertrophy. Right Ventricle: The right ventricular size is normal. No increase in right ventricular wall thickness. Right ventricular systolic function is moderately reduced. Left Atrium: Smoke but no thrombus in LA appendage. Left atrial size was moderately dilated. No left atrial/left atrial appendage thrombus was detected. Right Atrium: Right atrial size was mildly dilated. Pericardium: There is no evidence of pericardial effusion. Mitral Valve: Functional mitral regurgitation with restricted posterior leaflet. The mitral valve is abnormal. Moderate mitral valve regurgitation. No evidence of mitral valve stenosis. Tricuspid Valve: The tricuspid valve is normal in structure. Tricuspid valve regurgitation is trivial. Aortic Valve: The aortic valve is tricuspid. Aortic valve regurgitation is not visualized. No aortic stenosis is present. Pulmonic Valve: The  pulmonic valve was normal in structure. Pulmonic valve regurgitation is not visualized. Aorta: Normal caliber thoracic aorta with minimal plaque. The aortic root is normal in size and structure. IAS/Shunts: No PFO or ASD by color doppler. [employer] Electronically signed by Franki Monte Signature Date/Time: 05/01/2022/3:37:20 PM  Final      Medications:    sodium chloride      amiodarone  400 mg Oral BID   apixaban  5 mg Oral BID   Chlorhexidine Gluconate Cloth  6 each Topical Daily   insulin aspart  0-5 Units Subcutaneous QHS   insulin aspart  0-9 Units Subcutaneous TID WC   levothyroxine  100 mcg Oral Q0600   lipase/protease/amylase  36,000 Units Oral TID WC   mupirocin ointment  1 Application Nasal BID   polyethylene glycol  34 g Oral BID   sodium chloride flush  3 mL Intravenous Q12H   sodium chloride flush  3 mL Intravenous Q12H   spironolactone  12.5 mg Oral Daily   torsemide  20 mg Oral Daily    Assessment/ Plan:     57 y.o. male with past medical conditions including hypertension, paroxysmal A-fib on Eliquis, CHF with a EF less than 20%, and chronic kidney disease stage IV. Patient presents to the emergency department with lower extremity edema and shortness of breath. He has been admitted for Hyperkalemia [E87.5] CHF exacerbation (HCC) [I50.9] Stage 4 chronic kidney disease ([place]) [N18.4] Acute on chronic congestive heart failure, unspecified heart failure type ([place]) [I50.9]   #1: Acute kidney injury on CKD: Patient now has CKD stage IV with a GFR of close to 20 cc/min.  Presently furosemide and metolazone are on hold.  Will continue the small dose of spironolactone at this time.  Would like to start him on oral torsemide 20 mg daily.   #2: Hyperkalemia: Hyperkalemia has improved.   #3: Congestive heart failure/atrial fibrillation: Significantly improved with Lasix spironolactone and metolazone.  Patient is presently on fluid restriction and also spironolactone.  Will  start oral torsemide.  Cardiology note appreciated.  Patient is now on amiodarone and also inotropes.   #4: Diabetes: Will continue the insulin protocol for now.   #5: Hyponatremia: Hyponatremia most likely secondary to fluid overload leading to dilutional hyponatremia which has now improved.  Will continue to monitor closely.    LOS: Page, [facility] 3/10/202411:06 AM

## 2022-05-04 ENCOUNTER — Encounter
Admission: EM | Disposition: A | Discharge status: Home / Self Care | Payer: Self-pay | Source: Home / Self Care | Attending: Internal Medicine

## 2022-05-04 ENCOUNTER — Other Ambulatory Visit: Payer: Self-pay

## 2022-05-04 ENCOUNTER — Other Ambulatory Visit (HOSPITAL_COMMUNITY): Payer: Self-pay

## 2022-05-04 ENCOUNTER — Inpatient Hospital Stay: Payer: Medicaid Other | Admitting: Certified Registered"

## 2022-05-04 ENCOUNTER — Telehealth (HOSPITAL_COMMUNITY): Payer: Self-pay | Admitting: Pharmacy Technician

## 2022-05-04 ENCOUNTER — Encounter: Payer: Self-pay | Admitting: Internal Medicine

## 2022-05-04 DIAGNOSIS — I480 Paroxysmal atrial fibrillation: Secondary | ICD-10-CM

## 2022-05-04 DIAGNOSIS — R1013 Epigastric pain: Secondary | ICD-10-CM | POA: Diagnosis not present

## 2022-05-04 DIAGNOSIS — F1091 Alcohol use, unspecified, in remission: Secondary | ICD-10-CM

## 2022-05-04 HISTORY — PX: ESOPHAGOGASTRODUODENOSCOPY (EGD) WITH PROPOFOL: SHX5813

## 2022-05-04 LAB — COMPREHENSIVE METABOLIC PANEL
ALT: 30 U/L (ref 0–44)
AST: 15 U/L (ref 15–41)
Albumin: 3.5 g/dL (ref 3.5–5.0)
Alkaline Phosphatase: 87 U/L (ref 38–126)
Anion gap: 12 (ref 5–15)
BUN: 73 mg/dL — ABNORMAL HIGH (ref 6–20)
CO2: 23 mmol/L (ref 22–32)
Calcium: 8.9 mg/dL (ref 8.9–10.3)
Chloride: 96 mmol/L — ABNORMAL LOW (ref 98–111)
Creatinine, Ser: 3.36 mg/dL — ABNORMAL HIGH (ref 0.61–1.24)
GFR, Estimated: 21 mL/min — ABNORMAL LOW (ref >60)
Glucose, Bld: 107 mg/dL — ABNORMAL HIGH (ref 70–99)
Potassium: 5.1 mmol/L (ref 3.5–5.1)
Sodium: 131 mmol/L — ABNORMAL LOW (ref 135–145)
Total Bilirubin: 2.7 mg/dL — ABNORMAL HIGH (ref 0.3–1.2)
Total Protein: 7.6 g/dL (ref 6.5–8.1)

## 2022-05-04 SURGERY — ESOPHAGOGASTRODUODENOSCOPY (EGD) WITH PROPOFOL
Anesthesia: General

## 2022-05-04 MED ORDER — PANCRELIPASE (LIP-PROT-AMYL) 36000-114000 UNITS PO CPEP: 36000.0000 [IU] | ORAL_CAPSULE | Freq: Three times a day (TID) | ORAL | 2 refills | Status: DC

## 2022-05-04 MED ORDER — SODIUM CHLORIDE 0.9 % IV SOLN: INTRAVENOUS | Status: DC | PRN

## 2022-05-04 MED ORDER — LIDOCAINE HCL (CARDIAC) PF 100 MG/5ML IV SOSY
PREFILLED_SYRINGE | INTRAVENOUS | Status: DC | PRN
  Administered 2022-05-04: 80 mg via INTRAVENOUS

## 2022-05-04 MED ORDER — SODIUM CHLORIDE 0.9 % IV SOLN: INTRAVENOUS | Status: DC

## 2022-05-04 MED ORDER — EPHEDRINE SULFATE (PRESSORS) 50 MG/ML IJ SOLN
INTRAMUSCULAR | Status: DC | PRN
  Administered 2022-05-04 (×2): 5 mg via INTRAVENOUS

## 2022-05-04 MED ORDER — AMIODARONE HCL 400 MG PO TABS: 400.0000 mg | ORAL_TABLET | Freq: Every day | ORAL | 0 refills | Status: DC

## 2022-05-04 MED ORDER — ONDANSETRON HCL 4 MG/2ML IJ SOLN
INTRAMUSCULAR | Status: DC | PRN
  Administered 2022-05-04: 4 mg via INTRAVENOUS

## 2022-05-04 MED ORDER — TORSEMIDE 20 MG PO TABS: 60.0000 mg | ORAL_TABLET | Freq: Every day | ORAL | 0 refills | Status: DC

## 2022-05-04 MED ORDER — GLYCOPYRROLATE 0.2 MG/ML IJ SOLN
INTRAMUSCULAR | Status: DC | PRN
  Administered 2022-05-04: .2 mg via INTRAVENOUS

## 2022-05-04 MED ORDER — PROPOFOL 500 MG/50ML IV EMUL
INTRAVENOUS | Status: DC | PRN
  Administered 2022-05-04: 150 ug/kg/min via INTRAVENOUS

## 2022-05-04 MED ORDER — FAMOTIDINE 20 MG PO TABS: 20.0000 mg | ORAL_TABLET | Freq: Every day | ORAL | 1 refills | Status: DC

## 2022-05-04 MED ORDER — POLYETHYLENE GLYCOL 3350 17 G PO PACK: 34.0000 g | PACK | Freq: Two times a day (BID) | ORAL | 0 refills | Status: DC

## 2022-05-04 MED ORDER — ONDANSETRON HCL 4 MG PO TABS: 4.0000 mg | ORAL_TABLET | Freq: Four times a day (QID) | ORAL | 0 refills | Status: DC | PRN

## 2022-05-04 MED ORDER — EMPAGLIFLOZIN 10 MG PO TABS: 10.0000 mg | ORAL_TABLET | Freq: Every day | ORAL | 1 refills | Status: DC

## 2022-05-04 NOTE — TOC Benefit Eligibility Note (Addendum)
Patient Teacher, English as a foreign language completed.    The patient is currently admitted and upon discharge could be taking Jardiance 10 mg.  Requires Prior Authorization  The patient is currently admitted and upon discharge could be taking Farxiga 10 mg.  Requires Prior Authorization  The patient is insured through [employer], [place] Patient Advocate Specialist [place] Patient Advocate Team Direct Number: [phone]  Fax: [phone]

## 2022-05-04 NOTE — Progress Notes (Signed)
[facility]  ROUNDING NOTE   Subjective:   Hector [name] is a 57 y.o. male with past medical conditions including hypertension, paroxysmal A-fib on Eliquis, CHF with a EF less than 20%, and chronic kidney disease stage IV. Patient presents to the emergency department with lower extremity edema and shortness of breath. He has been admitted for Hyperkalemia [E87.5] CHF exacerbation (HCC) [I50.9] Stage 4 chronic kidney disease ([place]) [N18.4] Acute on chronic congestive heart failure, unspecified heart failure type [facility]) [I50.9]  Patient is known to our practice from previous hospital admissions and is followed outpatient by [facility] nephrology.    Patient seen laying in bed, after morning procedure Currently n.p.o. for EGD No active bleeding noted. Patient requesting breakfast tray No complaints to offer.   Objective:  Vital signs in last 24 hours:  Temp:  [96.7 F (35.9 C)-98.3 F (36.8 C)] 98.3 F (36.8 C) (03/11 1224) Pulse Rate:  [54-72] 58 (03/11 1224) Resp:  [16-28] 16 (03/11 1224) BP: (102-121)/(78-96) 107/83 (03/11 1224) SpO2:  [94 %-100 %] 100 % (03/11 1224)  Weight change:  Filed Weights   05/01/22 0600 05/02/22 0403 05/03/22 0355  Weight: 104.5 kg 104.9 kg 104.8 kg    Intake/Output: I/O last 3 completed shifts: In: 1135.9 [P.O.:840; I.V.:295.9] Out: 1450 [Urine:1450]   Intake/Output this shift:  Total I/O In: 50 [I.V.:50] Out: 300 [Urine:300]  Physical Exam: General: NAD, laying in bed  Head: Normocephalic, atraumatic. Moist oral mucosal membranes  Eyes: Anicteric  Lungs:  Clear to auscultation, normal effort  Heart: S1-S2 present  Abdomen:  Soft, nontender, nondistended  Extremities: No peripheral edema.  Neurologic: Nonfocal, moving all four extremities  Skin: No lesions  Access: None    Basic Metabolic Panel: Recent Labs  Lab 04/29/22 0357 04/30/22 0425 05/01/22 0459 05/02/22 0535 05/03/22 0620 05/04/22 1012  NA 134* 134*  131* 130* 133* 131*  K 3.6 3.8 4.2 4.6 4.4 5.1  CL 97* 96* 92* 95* 98 96*  CO2 '25 27 28 23 26 23  '$ GLUCOSE 169* 110* 113* 133* 135* 107*  BUN 69* 66* 69* 70* 72* 73*  CREATININE 3.61* 3.51* 3.61* 3.33* 3.25* 3.36*  CALCIUM 8.2* 8.7* 8.9 9.1 8.8* 8.9  MG 2.5* 2.2 2.2 2.3 2.7*  --      Liver Function Tests: Recent Labs  Lab 04/28/22 0552 04/30/22 0425 05/02/22 0535 05/03/22 0620 05/04/22 1012  AST 21 52* '24 18 15  '$ ALT 20 63* 49* 38 30  ALKPHOS 93 111 105 106 87  BILITOT 4.6* 4.6* 2.4* 2.0* 2.7*  PROT 6.5 6.9 7.2 7.0 7.6  ALBUMIN 3.1* 3.3* 3.4* 3.4* 3.5    No results for input(s): "LIPASE", "AMYLASE" in the last 168 hours.  No results for input(s): "AMMONIA" in the last 168 hours.  CBC: Recent Labs  Lab 04/29/22 0357 04/30/22 0425 05/01/22 0459 05/02/22 0535 05/03/22 0620  WBC 8.3 7.8 8.7 10.1 7.5  HGB 11.6* 12.2* 12.2* 12.7* 12.6*  HCT 36.1* 37.3* 38.3* 38.8* 38.4*  MCV 84.9 84.6 84.9 84.0 84.8  PLT 194 209 225 238 231     Cardiac Enzymes: No results for input(s): "CKTOTAL", "CKMB", "CKMBINDEX", "TROPONINI" in the last 168 hours.   BNP: Invalid input(s): "POCBNP"  CBG: Recent Labs  Lab 04/29/22 2147  GLUCAP 101*     Microbiology: Results for orders placed or performed during the hospital encounter of 04/25/22  Resp panel by RT-PCR (RSV, Flu A&B, Covid) Anterior Nasal Swab     Status: None  Collection Time: 04/25/22  8:39 PM   Specimen: Anterior Nasal Swab  Result Value Ref Range Status   SARS Coronavirus 2 by RT PCR NEGATIVE NEGATIVE Final    Comment: (NOTE) SARS-CoV-2 target nucleic acids are NOT DETECTED.  The SARS-CoV-2 RNA is generally detectable in upper respiratory specimens during the acute phase of infection. The lowest concentration of SARS-CoV-2 viral copies this assay can detect is 138 copies/mL. A negative result does not preclude SARS-Cov-2 infection and should not be used as the sole basis for treatment or other patient  management decisions. A negative result may occur with  improper specimen collection/handling, submission of specimen other than nasopharyngeal swab, presence of viral mutation(s) within the areas targeted by this assay, and inadequate number of viral copies(<138 copies/mL). A negative result must be combined with clinical observations, patient history, and epidemiological information. The expected result is Negative.  Fact Sheet for Patients:  EntrepreneurPulse.com.au  Fact Sheet for Healthcare Providers:  IncredibleEmployment.be  This test is no t yet approved or cleared by the [place] FDA and  has been authorized for detection and/or diagnosis of SARS-CoV-2 by FDA under an Emergency Use Authorization (EUA). This EUA will remain  in effect (meaning this test can be used) for the duration of the COVID-19 declaration under Section 564(b)(1) of the Act, 21 U.S.C.section 360bbb-3(b)(1), unless the authorization is terminated  or revoked sooner.       Influenza A by PCR NEGATIVE NEGATIVE Final   Influenza B by PCR NEGATIVE NEGATIVE Final    Comment: (NOTE) The Xpert Xpress SARS-CoV-2/FLU/RSV plus assay is intended as an aid in the diagnosis of influenza from Nasopharyngeal swab specimens and should not be used as a sole basis for treatment. Nasal washings and aspirates are unacceptable for Xpert Xpress SARS-CoV-2/FLU/RSV testing.  Fact Sheet for Patients: EntrepreneurPulse.com.au  Fact Sheet for Healthcare Providers: IncredibleEmployment.be  This test is not yet approved or cleared by the [place] FDA and has been authorized for detection and/or diagnosis of SARS-CoV-2 by FDA under an Emergency Use Authorization (EUA). This EUA will remain in effect (meaning this test can be used) for the duration of the COVID-19 declaration under Section 564(b)(1) of the Act, 21 U.S.C. section 360bbb-3(b)(1),  unless the authorization is terminated or revoked.     Resp Syncytial Virus by PCR NEGATIVE NEGATIVE Final    Comment: (NOTE) Fact Sheet for Patients: EntrepreneurPulse.com.au  Fact Sheet for Healthcare Providers: IncredibleEmployment.be  This test is not yet approved or cleared by the [place] FDA and has been authorized for detection and/or diagnosis of SARS-CoV-2 by FDA under an Emergency Use Authorization (EUA). This EUA will remain in effect (meaning this test can be used) for the duration of the COVID-19 declaration under Section 564(b)(1) of the Act, 21 U.S.C. section 360bbb-3(b)(1), unless the authorization is terminated or revoked.  Performed at [facility], [address] [place], [place] [address]   MRSA Next Gen by PCR, Nasal     Status: Abnormal   Collection Time: 04/28/22  5:37 PM   Specimen: Nasal Mucosa; Nasal Swab  Result Value Ref Range Status   MRSA by PCR Next Gen DETECTED (A) NOT DETECTED Final    Comment: CRITICAL RESULT CALLED TO, READ BACK BY AND VERIFIED WITH: RN MARCEL TURNER ON 04/28/22 '@1940'$  RP (NOTE) The GeneXpert MRSA Assay (FDA approved for NASAL specimens only), is one component of a comprehensive MRSA colonization surveillance program. It is not intended to diagnose MRSA infection nor to guide or  monitor treatment for MRSA infections. Test performance is not FDA approved in patients less than 41 years old. Performed at [facility] [address] [place], [place] [address]     Coagulation Studies: Recent Labs    05/01/22 1403  LABPROT 14.9  INR 1.2     Urinalysis: No results for input(s): "COLORURINE", "LABSPEC", "PHURINE", "GLUCOSEU", "HGBUR", "BILIRUBINUR", "KETONESUR", "PROTEINUR", "UROBILINOGEN", "NITRITE", "LEUKOCYTESUR" in the last 72 hours.  Invalid input(s): "APPERANCEUR"    Imaging: No results found.   Medications:    sodium chloride      amiodarone   400 mg Oral BID   apixaban  5 mg Oral BID   Chlorhexidine Gluconate Cloth  6 each Topical Daily   famotidine  20 mg Oral Daily   insulin aspart  0-5 Units Subcutaneous QHS   insulin aspart  0-9 Units Subcutaneous TID WC   levothyroxine  100 mcg Oral Q0600   lipase/protease/amylase  36,000 Units Oral TID WC   polyethylene glycol  34 g Oral BID   sodium chloride flush  3 mL Intravenous Q12H   sodium chloride flush  3 mL Intravenous Q12H   spironolactone  12.5 mg Oral Daily   torsemide  20 mg Oral Daily   sodium chloride, acetaminophen **OR** acetaminophen, ondansetron **OR** ondansetron (ZOFRAN) IV, mouth rinse, oxyCODONE, sodium chloride flush  Assessment/ Plan:  [name] is a 57 y.o. black male with past medical conditions including hypertension, paroxysmal A-fib on Eliquis, CHF with a EF less than 20%, and chronic kidney disease stage IV followed by [facility]. Patient presents to the emergency department with lower extremity edema and shortness of breath. He has been admitted for Hyperkalemia [E87.5] CHF exacerbation (HCC) [I50.9] Stage 4 chronic kidney disease ([place]) [N18.4] Acute on chronic congestive heart failure, unspecified heart failure type ([place]) [I50.9]  Acute Kidney Injury with hyponatremia on chronic kidney disease stage 4 with baseline creatinine 2.83 and GFR of 25 on 03/02/22. Hyperkalemia has resolved.  Acute kidney injury secondary to acute cardiorenal syndrome.  Chronic kidney disease is secondary to hypertension.   - continue torsemide and spironolactone - holding canagliflozin and metolazone -  creatinine slightly elevated but stable  Lab Results  Component Value Date   CREATININE 3.36 (H) 05/04/2022   CREATININE 3.25 (H) 05/03/2022   CREATININE 3.33 (H) 05/02/2022    Intake/Output Summary (Last 24 hours) at 05/04/2022 1400 Last data filed at 05/04/2022 0947 Gross per 24 hour  Intake 418.4 ml  Output 1300 ml  Net -881.6 ml    2. Anemia of  chronic kidney disease Lab Results  Component Value Date   HGB 12.6 (L) 05/03/2022    Hemoglobin remains acceptable.  3. Acute on chronic systolic and diastolic heart failure. ECHO from 12/08/2021 shows EF less than 20% with a moderate to severe MVR. Appreciate cardiology input. Now on milrinone gtt.   - Diuretics as above: Torsemide and spironolactone Recommend increasing torsemide to 40 mg daily.  4. Diabetes mellitus type II with chronic kidney disease: holding cangliflozin due to AKI. Continue glucose control.     LOS: 8   3/11/20242:00 PM

## 2022-05-04 NOTE — Anesthesia Preprocedure Evaluation (Signed)
Anesthesia Evaluation  Patient identified by MRN, date of birth, ID band Patient awake    Reviewed: Allergy & Precautions, NPO status , Patient's Chart, lab work & pertinent test results  Airway Mallampati: II  TM Distance: >3 FB Neck ROM: full    Dental  (+) Missing, Poor Dentition, Chipped, Dental Advisory Given   Pulmonary neg pulmonary ROS, sleep apnea , former smoker   Pulmonary exam normal  + decreased breath sounds      Cardiovascular Exercise Tolerance: Poor hypertension, Pt. on medications + Past MI, +CHF and + DOE  negative cardio ROS Normal cardiovascular exam+ dysrhythmias Atrial Fibrillation  Rhythm:Regular Rate:Normal     Neuro/Psych CVA negative neurological ROS  negative psych ROS   GI/Hepatic negative GI ROS, Neg liver ROS,,,(+)     substance abuse  alcohol use  Endo/Other  negative endocrine ROSdiabetes, Type 2Hypothyroidism    Renal/GU Renal InsufficiencyRenal diseasenegative Renal ROS  negative genitourinary   Musculoskeletal negative musculoskeletal ROS (+)    Abdominal  (+) + obese  Peds negative pediatric ROS (+)  Hematology negative hematology ROS (+) Blood dyscrasia, anemia   Anesthesia Other Findings Past Medical History: No date: Alcohol abuse No date: Alcoholic cardiomyopathy ([place])     Comment:  a. 12/2007 MV: EF 28%, no isch;  b. 8/12 Echo: EF               25-35%; c. 02/2014 Echo: EF 20-25%; d. 12/2014 Cath:               minimal CAD; e. 01/2015 Echo: EF 50-55%;  d. 05/2016 Echo:              EF 30-35%, diff HK, gr2 DD; e. 11/2016 Echo: EF 45-50%,               diff HK; f. 09/2021 Echo: EF <20%; g. 11/2021 Echo:               EF<20%. No date: Chronic combined systolic (congestive) and diastolic  (congestive) heart failure ([place])     Comment:  a. 05/2016 Echo: EF 30-35%; b. 11/2016 Echo: EF 45-50%,               diff HK; c. 09/2021 Echo: EF < 20%; d. 11/2021 Echo: EF                <20%, no rwma, Nl RV fxn, sev dil LA, mod-sev MR. mod dil              PA w/ mildly elev PASP. No date: CKD (chronic kidney disease), stage III (HCC) No date: Elevated troponin (chronic) No date: Essential hypertension 11/2013: GI bleed No date: Hyperlipidemia No date: Pancreatitis No date: Paroxysmal A-fib ([place])     Comment:  a. new onset s/p unsuccessful TEE/DCCV on 08/16/2014; b.               CHA2DS2VASc = 3-> eliquis (freq noncompliant); c. 02/2021               s/p TEE/DCCV; d. Prev on amio->d/c 2/2 abnl TFTs. No date: Paroxysmal atrial flutter (HCC) No date: Sleep-disordered breathing     Comment:  Has yet to have a sleep study No date: Stroke [facility])  Past Surgical History: 01/09/2015: CARDIAC CATHETERIZATION; N/A     Comment:  Procedure: Left Heart Cath and Coronary Angiography;                Surgeon: [name], MD;  Location: [place] [facility]              LAB;  Service: Cardiovascular;  Laterality: N/A; 03/05/2021: CARDIOVERSION; N/A     Comment:  Procedure: CARDIOVERSION;  Surgeon: Kate Sable,               MD;  Location: [facility] ORS;  Service: Cardiovascular;                Laterality: N/A; 04/28/2022: CENTRAL LINE INSERTION     Comment:  Procedure: CENTRAL LINE INSERTION;  Surgeon: Nelva Bush, MD;  Location: [place] [facility];                Service: Cardiovascular;; 07/22/2020: COLONOSCOPY; N/A     Comment:  Procedure: COLONOSCOPY;  Surgeon: Toledo, Benay Pike, MD;              Location: [facility] ENDOSCOPY;  Service: Gastroenterology;                Laterality: N/A; 08/16/2014: ELECTROPHYSIOLOGIC STUDY; N/A     Comment:  Procedure: CARDIOVERSION;  Surgeon: [name],               MD;  Location: [facility] ORS;  Service: Cardiovascular;                Laterality: N/A; 10/10/2015: FLEXIBLE SIGMOIDOSCOPY; N/A     Comment:  Procedure: FLEXIBLE SIGMOIDOSCOPY;  Surgeon: [name], MD;  Location: [facility] ENDOSCOPY;  Service:                Endoscopy;  Laterality: N/A; No date: KNEE SURGERY; Right November 2011: [facility] MYOVIEW LTD     Comment:  No ischemia or infarction. EF 50-55% ( no improvement               from 2009 Myoview EF of 28% 04/28/2022: RIGHT HEART CATH; N/A     Comment:  Procedure: RIGHT HEART CATH;  Surgeon: Nelva Bush,              MD;  Location: [place] [facility];  Service:               Cardiovascular;  Laterality: N/A; 08/16/2014: TEE WITHOUT CARDIOVERSION; N/A     Comment:  Procedure: TRANSESOPHAGEAL ECHOCARDIOGRAM (TEE);                Surgeon: [name], MD;  Location: [facility] ORS;                Service: Cardiovascular;  Laterality: N/A; 03/05/2021: TEE WITHOUT CARDIOVERSION; N/A     Comment:  Procedure: TRANSESOPHAGEAL ECHOCARDIOGRAM (TEE);                Surgeon: Kate Sable, MD;  Location: [facility] ORS;                Service: Cardiovascular;  Laterality: N/A;  BMI    Body Mass Index: 32.22 kg/m      Reproductive/Obstetrics negative OB ROS                             Anesthesia Physical Anesthesia Plan  ASA: 4  Anesthesia Plan: General   Post-op Pain Management:    Induction: Intravenous  PONV Risk Score and Plan: Propofol infusion and TIVA  Airway Management Planned: Natural Airway  Additional Equipment:   Intra-op Plan:   Post-operative Plan:   Informed Consent: I have reviewed the patients History and Physical, chart, labs and discussed the procedure including the risks, benefits and alternatives for the proposed anesthesia with the patient or authorized representative who has indicated his/her understanding and acceptance.     Dental Advisory Given  Plan Discussed with: CRNA and Surgeon  Anesthesia Plan Comments:        Anesthesia Quick Evaluation

## 2022-05-04 NOTE — Progress Notes (Signed)
[name], male   DOB: October 28, 1965, 57 y.o.   MRN: [id number]     Advanced Heart Failure Rounding Note  PCP-Cardiologist: Ida Rogue, MD   Subjective:    Off milrinone since 3/8.  Remains in NSR after DC-CV on 3/8   TEE 3/8 EF < 20%. Moderate functional MR RV mod HK   EGD this am with gastritis. No varices.   Denies CP or SOB but says he can't go home because his throat is sore    Objective:   Weight Range: 104.8 kg Body mass index is 32.22 kg/m.   Vital Signs:   Temp:  [96.7 F (35.9 C)-98.3 F (36.8 C)] 98.3 F (36.8 C) (03/11 1224) Pulse Rate:  [54-72] 58 (03/11 1224) Resp:  [16-28] 16 (03/11 1224) BP: (102-121)/(78-96) 107/83 (03/11 1224) SpO2:  [94 %-100 %] 100 % (03/11 1224) Last BM Date : 04/28/22  Weight change: Filed Weights   05/01/22 0600 05/02/22 0403 05/03/22 0355  Weight: 104.5 kg 104.9 kg 104.8 kg    Intake/Output:   Intake/Output Summary (Last 24 hours) at 05/04/2022 1309 Last data filed at 05/04/2022 0947 Gross per 24 hour  Intake 418.4 ml  Output 1300 ml  Net -881.6 ml       Physical Exam    General: Lying flat in bed  No resp difficulty HEENT: Normal Neck: Supple. JVP not elevated. RIJ TLC Carotids 2+ bilat; no bruits. No lymphadenopathy or thyromegaly appreciated. Cor: PMI nondisplaced. Irregular rate & rhythm. No rubs, gallops or murmurs. Lungs: Clear Abdomen: Soft, nontender, nondistended. No hepatosplenomegaly. No bruits or masses. Good bowel sounds. Extremities: No cyanosis, clubbing, rash, edema Neuro: Alert & orientedx3, cranial nerves grossly intact. moves all 4 extremities w/o difficulty. Affect pleasant   Telemetry   Sinus 50-60 Personally reviewed    Labs    CBC Recent Labs    05/02/22 0535 05/03/22 0620  WBC 10.1 7.5  HGB 12.7* 12.6*  HCT 38.8* 38.4*  MCV 84.0 84.8  PLT 238 [id number]    Basic Metabolic Panel Recent Labs    05/02/22 0535 05/03/22 0620 05/04/22 1012  NA 130* 133*  131*  K 4.6 4.4 5.1  CL 95* 98 96*  CO2 '23 26 23  '$ GLUCOSE 133* 135* 107*  BUN 70* 72* 73*  CREATININE 3.33* 3.25* 3.36*  CALCIUM 9.1 8.8* 8.9  MG 2.3 2.7*  --     Liver Function Tests Recent Labs    05/03/22 0620 05/04/22 1012  AST 18 15  ALT 38 30  ALKPHOS 106 87  BILITOT 2.0* 2.7*  PROT 7.0 7.6  ALBUMIN 3.4* 3.5    No results for input(s): "LIPASE", "AMYLASE" in the last 72 hours. Cardiac Enzymes No results for input(s): "CKTOTAL", "CKMB", "CKMBINDEX", "TROPONINI" in the last 72 hours.  BNP: BNP (last 3 results) Recent Labs    02/23/22 0920 03/23/22 0642 04/26/22 1646  BNP 609.5* 736.5* 2,109.1*     ProBNP (last 3 results) No results for input(s): "PROBNP" in the last 8760 hours.   D-Dimer No results for input(s): "DDIMER" in the last 72 hours. Hemoglobin A1C No results for input(s): "HGBA1C" in the last 72 hours. Fasting Lipid Panel No results for input(s): "CHOL", "HDL", "LDLCALC", "TRIG", "CHOLHDL", "LDLDIRECT" in the last 72 hours. Thyroid Function Tests No results for input(s): "TSH", "T4TOTAL", "T3FREE", "THYROIDAB" in the last 72 hours.  Invalid input(s): "FREET3"  Other results:   Imaging    No results found.   Medications:  Scheduled Medications:  amiodarone  400 mg Oral BID   apixaban  5 mg Oral BID   Chlorhexidine Gluconate Cloth  6 each Topical Daily   famotidine  20 mg Oral Daily   insulin aspart  0-5 Units Subcutaneous QHS   insulin aspart  0-9 Units Subcutaneous TID WC   levothyroxine  100 mcg Oral Q0600   lipase/protease/amylase  36,000 Units Oral TID WC   polyethylene glycol  34 g Oral BID   sodium chloride flush  3 mL Intravenous Q12H   sodium chloride flush  3 mL Intravenous Q12H   spironolactone  12.5 mg Oral Daily   torsemide  20 mg Oral Daily    Infusions:  sodium chloride      PRN Medications: sodium chloride, acetaminophen **OR** acetaminophen, ondansetron **OR** ondansetron (ZOFRAN) IV, mouth rinse,  oxyCODONE, sodium chloride flush   Assessment/Plan   1. Acute on chronic systolic CHF: Nonischemic cardiomyopathy.  Echo in 10/23 with EF < 20%, mild-moderate LV dilation, mod-severe MR, normal RV.  H/o noncompliance, ETOH abuse thought to be cause of cardiomyopathy.  Cath in 2016 with minimal coronary disease. RHC this admission with significant volume overload and low cardiac index 1.5.   - Has diuresed well. Weight down 14 pounds - TEE 3/8 EF < 20%. Moderate functional MR RV mod HK  - Off milrinone since 3/8 - No co-ox available but appears stable - Volume status stable on torsemide 20 daily and spiro 12.5 - No Entresto/ARB with CKD IV - No b-blocker with recent shock - Was on invokana as outpatient. Would switch to [place] 10 (was on this previosuly) - stop spiro with k 5.1 - Consider Bidil as outpatient 2. Atrial fibrillation: Persistent.  Had been planned for DCCV prior to admission but got admitted first.  I suspect elevated tbili is due to CHF rather than amiodarone.  - Remains in NSR after DC-CV on 3/8 - continue po amio  and Eliquis 3. CKD stage IV: Creatinine stable at 3.36 4. Elevated LFTs: Tbili elevated.  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  I suspect elevated bilirubin this admission was due to CHF rather than amiodarone.  - LFTs now normalized   Ok to d/c from HF standpoint. Would send home of following cardiac meds  Torsemide 60 daily (this is home dose) Farxiga 10 daily (replaces Invokana) Eliquis 5 bid Amiodarone 400 daily  Stop: carvedilol and spiro  F/u HF Clinic next week with labs. Noncompliance has been an issue   AHF team will sign off.   Length of Stay: [place], MD  05/04/2022, 1:09 PM  Advanced Heart Failure Team Pager [phone] (M-F; 7a - 5p)  Please contact [facility] for night-coverage after hours (5p -7a ) and weekends on [email or web address]

## 2022-05-04 NOTE — Anesthesia Postprocedure Evaluation (Signed)
Anesthesia Post Note  Patient: MYSON PENSE  Procedure(s) Performed: ESOPHAGOGASTRODUODENOSCOPY (EGD) WITH PROPOFOL  Patient location during evaluation: PACU Anesthesia Type: General Level of consciousness: sedated Pain management: satisfactory to patient Vital Signs Assessment: post-procedure vital signs reviewed and stable Respiratory status: respiratory function stable and nonlabored ventilation Cardiovascular status: stable Anesthetic complications: no   No notable events documented.   Last Vitals:  Vitals:   05/04/22 0849 05/04/22 0859  BP: (!) 121/96 111/88  Pulse: 72 64  Resp: (!) 28 20  Temp: (!) 35.9 C   SpO2: 100% 96%    Last Pain:  Vitals:   05/04/22 0849  TempSrc: Temporal  PainSc: Asleep                 VAN STAVEREN,Deondray Ospina

## 2022-05-04 NOTE — Op Note (Signed)
[facility] Gastroenterology Patient Name: Chananya Gossage Procedure Date: 05/04/2022 8:19 AM MRN: [id number] Account #: [id number] Date of Birth: 08/23/1965 Admit Type: Outpatient Age: 57 Room: [facility] ENDO ROOM 2 Gender: Male Note Status: Finalized Instrument Name: Upper Endoscope [id number] Procedure:             Upper GI endoscopy Indications:           Epigastric abdominal pain, Cirrhosis rule out                         esophageal varices Providers:             Lin Landsman MD, MD Referring MD:          Baxter Kail. Rebeca Alert MD, MD (Referring MD) Medicines:             General Anesthesia Complications:         No immediate complications. Estimated blood loss: None. Procedure:             Pre-Anesthesia Assessment:                        - Prior to the procedure, a History and Physical was                         performed, and patient medications and allergies were                         reviewed. The patient is competent. The risks and                         benefits of the procedure and the sedation options and                         risks were discussed with the patient. All questions                         were answered and informed consent was obtained.                         Patient identification and proposed procedure were                         verified by the physician, the nurse, the                         anesthesiologist, the anesthetist and the technician                         in the pre-procedure area in the procedure room in the                         endoscopy suite. Mental Status Examination: alert and                         oriented. Airway Examination: normal oropharyngeal                         airway and neck mobility. Respiratory Examination:  clear to auscultation. CV Examination: normal.                         Prophylactic Antibiotics: The patient does not require                         prophylactic  antibiotics. Prior Anticoagulants: The                         patient has taken Eliquis (apixaban), last dose was 1                         day prior to procedure. ASA Grade Assessment: III - A                         patient with severe systemic disease. After reviewing                         the risks and benefits, the patient was deemed in                         satisfactory condition to undergo the procedure. The                         anesthesia plan was to use general anesthesia.                         Immediately prior to administration of medications,                         the patient was re-assessed for adequacy to receive                         sedatives. The heart rate, respiratory rate, oxygen                         saturations, blood pressure, adequacy of pulmonary                         ventilation, and response to care were monitored                         throughout the procedure. The physical status of the                         patient was re-assessed after the procedure.                        After obtaining informed consent, the endoscope was                         passed under direct vision. Throughout the procedure,                         the patient's blood pressure, pulse, and oxygen                         saturations were monitored continuously. The Endoscope  was introduced through the mouth, and advanced to the                         second part of duodenum. The upper GI endoscopy was                         accomplished without difficulty. The patient tolerated                         the procedure well. Findings:      The duodenal bulb and second portion of the duodenum were normal.      Diffuse mildly erythematous mucosa without bleeding was found in the       gastric body and in the gastric antrum. Biopsies were taken with a cold       forceps for Helicobacter pylori testing.      The cardia and gastric fundus were  normal on retroflexion.      Esophagogastric landmarks were identified: the gastroesophageal junction       was found at 41 cm from the incisors.      One tongue of salmon-colored mucosa was present from 40 to 41 cm. No       other visible abnormalities were present. The maximum longitudinal       extent of these esophageal mucosal changes was 1 cm in length. Biopsies       were taken with a cold forceps for histology.      - No evidence of esophageal varices Impression:            - Normal duodenal bulb and second portion of the                         duodenum.                        - Erythematous mucosa in the gastric body and antrum.                         Biopsied.                        - Esophagogastric landmarks identified.                        - Salmon-colored mucosa suspicious for short-segment                         Barrett's esophagus. Biopsied. Recommendation:        - Return patient to hospital ward for ongoing care.                        - Cardiac diet today.                        - Continue present medications.                        - Await pathology results. Procedure Code(s):     --- Professional ---                        [phone], Esophagogastroduodenoscopy, flexible,  transoral; with biopsy, single or multiple Diagnosis Code(s):     --- Professional ---                        K22.89, Other specified disease of esophagus                        K31.89, Other diseases of stomach and duodenum                        R10.13, Epigastric pain                        K74.60, Unspecified cirrhosis of liver CPT copyright 2022 [employer]. All rights reserved. The codes documented in this report are preliminary and upon coder review may  be revised to meet current compliance requirements. Dr. Ulyess Mort Lin Landsman MD, MD 05/04/2022 8:47:18 AM This report has been signed electronically. Number of Addenda: 0 Note Initiated  On: 05/04/2022 8:19 AM Estimated Blood Loss:  Estimated blood loss: none.      [facility]

## 2022-05-04 NOTE — Discharge Summary (Addendum)
Physician Discharge Summary   Patient: MATEUS MCFARLING MRN: [id number] DOB: 09/11/1965  Admit date:     04/25/2022  Discharge date: 05/04/22  Discharge Physician: Lorella Nimrod   PCP: [name], MD   Recommendations at discharge:  Please obtain CBC and BMP within next few days Follow-up with heart failure team closely. Follow-up with nephrology Follow-up with primary care provider  Discharge Diagnoses: Principal Problem:   Acute on chronic systolic CHF (congestive heart failure) (HCC) Active Problems:   Nonischemic cardiomyopathy: EF 20-25%   Hyperkalemia   Atrial fibrillation with RVR (HCC)   Chronic renal impairment, stage 4 (severe) (HCC)   Hyponatremia   Hypothyroidism   Paroxysmal A-fib (HCC)   Type II diabetes mellitus with renal manifestations (HCC)   Hyperbilirubinemia   Thrombocytopenia (HCC)   Alcohol use, unspecified, in remission   History of GI bleed   Abdominal pain, epigastric   Alcoholic cirrhosis of liver without ascites [facility])   Hospital Course: Taken from H&P.   KADAN BASARA is a 57 y.o. male with medical history significant for A. fib on Eliquis, pancreatitis, sCHF with EF < 20%, CKD-IV, HTN, HLD, stroke, hypothyroidism, alcohol abuse in remission, GI bleeding, OSA, anemia, hospitalized 2 months ago with RSV who presents to the ED with cough and shortness of breath for the past 2 days as well as abdominal distention.  ED course and data review: BP 141/126 with heart rate in the low 100s.  Respirations 25-26 with O2 sat on room mid to high 90s.  Labs notable for troponin of 48, BNP pending.  CBC with normal BBC count platelets 1 38,000., respiratory viral panel negative.  Creatinine near baseline at 3.5, potassium 6.2, sodium 132.  Hepatic function panel with total bilirubin 4.8. EKG,.Reviewed and interpreted showing sinus at 106 with nonspecific ST-T wave changes chest x-ray shows.Findings suspicious for developing pulmonary edema. Patient  treated with IV Lasix for CHF, given a dose of calcium gluconate and Veltassa for hyperkalemia.  3/3: Vitals stable.  Hyperkalemia has been resolved with potassium of 4.6, Veltassa discontinued.  Patient was on potassium supplement at home which should be discontinued. BNP elevated at 736, patient also has CKD stage IV.  Troponin mildly positive with a flat curve.  RUQ ultrasound with cholelithiasis and gallbladder sludge without complicating factors.  Findings also suspicious for hepatic cirrhosis.  Patient quit drinking 13 years ago. Worsening T. bili and INR.  Concern of cardiohepatic and cardiorenal. Clinically appears euvolemic. Cardiology and nephrology were consulted.  Addendum.  Cardiology is advising holding amiodarone, increasing the dose of carvedilol and switching Eliquis with heparin.  Patient might need right heart cath to see the pressures.  They were also recommending involving GI as his liver abnormalities does not really match with hepatic congestion.  Message sent to Dr.Vanga.  3/4: Hemodynamically stable.  Worsening renal function, nephrology is advising holding diuresis.  Clinically appears euvolemic but significant worsening of BNP in 1 day.  CK has been normalized.  T. bili seems stable with slight worsening of INR to 2. Patient might need milrinone for concern of low output heart failure, might need to go under advanced heart failure team, cardiology to decide regarding transfer.  3/5; vitals stable.  Renal function continued to get worse.  Sodium at 128 most likely due to worsening renal function.  Going for right heart catheterization for further investigation of his low output decompensated heart failure.  GI also recommended EGD to rule out any peptic ulcer disease causing  his epigastric pain and if that is negative they were recommending general surgery evaluation for cholecystectomy as prior HIDA scan done last year with low EF.  They also started him on Creon for history of  atrophic pancreas.  3/6: Right heart cath with low output heart failure and severely elevated pressures.  Patient was transferred to ICU and started on milrinone with IV diuresis.  Renal functions currently stable, net negative of -4 L over the past 24 hours.  Abdominal pain seems improving.  3/7: Vital stable with borderline blood pressure.  UOP recorded for as above 5 L with total net negative of -8605.  CVP 5-6, second dose of Lasix was held by cardiology.  Renal function seems stable.  Cardiology is planning DCCV tomorrow as he remained in A-fib.  Advanced heart failure team restarted amiodarone as elevated T. bili and mild transaminitis is most likely due to hepatic congestion with acute on chronic CHF.  Hepatic function today with mild transaminitis with AST of 52, ALT 63 and T. bili stable at 4.6.  3/8: Hemodynamically stable with mildly elevated heart rate, remained in A-fib-going for DCCV today.  Currently on amiodarone.  Milrinone was stopped today, Lasix will be held due to normal CVP at 6.  Slight increase in creatinine to 3.61 with stable GFR at 19.  UOP of 3300 recorded with net negative of -10,770.  Most likely be converted to torsemide per heart failure team for discharge.  3/9: Patient was successfully converted to sinus rhythm yesterday.  Remained in sinus.  Cardiology to decide about diuresis.  Converting heparin with Eliquis today.  3/10: Vitals stable.  Renal function seems stable with some improvement of creatinine to 3.25.  Transaminitis resolved and T. bili improving, at 2 today.  Nephrology added 20 mg of torsemide.  Cardiology transitioned him to p.o. amiodarone.  GI is planning EGD tomorrow.  3/11: Patient remained hemodynamically stable.  EGD today with mild gastritis, no varices noted.  Cardiology placed their final recommendations for discharge, he will be going home on torsemide 60 mg daily, metolazone, spironolactone and beta-blocker has been discontinued.  Amiodarone dose  was increased to 400 mg daily and they will readjust as outpatient.  They also recommended Wilder Glade but his renal function precludes that.  Patient was placed on Jardiance.  They will make further adjustment of his medications during his follow-up appointment.  Will make necessary changes to his medication list and he will be taking current medications and need to have a very close follow-up appointment in advance heart failure clinic for further management.  Patient also need a close follow-up with his nephrologist as he might be approaching dialysis, which can be a challenging decision based on his advanced heart failure.  Patient is very high risk for deterioration and mortality based on advance heart failure, worsening renal function and other underlying comorbidities.  Assessment and Plan: * Acute on chronic systolic CHF (congestive heart failure) (HCC) Nonischemic cardiomyopathy, EF less than 20% not previously a candidate for ICD due to noncompliance Initially home diuretics were held due to worsening renal function.   Last echo October 2023 showed EF less than 20% Patient was previously deemed not to be ICD insertion by EP due to noncompliance  Patient was last seen by his cardiologist Dr. Haroldine Laws in December 2023 at which time metolazone was added to his diuretic regimen, weight was 247 pounds at the time Right heart catheterization on 3/5 with low output heart failure and severely elevated pressures.  Patient  was transferred to ICU and started on milrinone with IV diuresis.  Milrinone was discontinued on 3/8.  IV diuresis was held due to low CVP.  Nephrology started on 20 mg of torsemide today. -Daily BMP and weight -Strict intake and output -Beta-blocker was held due to low output heart failure  Nonischemic cardiomyopathy: EF 20-25% Please see above  Hyperkalemia Potassium was 6.2 on admission which has been improved. Received calcium gluconate and Veltassa in the ED Patient was  on potassium supplement at home which need to be discontinued. Worsening renal function can be contributory.  Atrial fibrillation with RVR (HCC) S/p successful DCCV and restoration of normal rhythm. -Home Eliquis was restarted yesterday -IV amiodarone is being converted to p.o. today  Hyponatremia Clinically appears euvolemic with elevated BNP based on renal function. Can be due to diuretic use.  Sodium at 128 today which is little worsened then 132 -Continue to monitor  Chronic renal impairment, stage 4 (severe) (HCC) AKI with history of CKD stage IV.  Creatinine at 3.25 today with baseline around 3 Nephrology is on board. -Nephrology started low-dose torsemide today -Continue spironolactone  Hypothyroidism Continue levothyroxine  Type II diabetes mellitus with renal manifestations ([place]) Hold Invokana due to worsening renal function Sliding scale insulin coverage  Hyperbilirubinemia Bilirubin chronically elevated around 2.5 but now 4.8>>5.4 . INR 1.7 suspect related to hepatic congestion from CHF Had a HIDA scan July 2023 for bilirubin around 3 that showed "Abnormally low gallbladder ejection fraction equal to 6% indicative of decreased gallbladder emptying and gallbladder dysfunction" Renal ultrasound with concern of cirrhosis. GI was consulted and patient might need EGD and if that is normal they were recommending surgical evaluation.  Thrombocytopenia (HCC) Mild thrombocytopenia which seems chronic.  -Continue to monitor  Alcohol use, unspecified, in remission Patient does not currently drink, per patient he quit drinking 13 years ago  History of GI bleed No acute concerns    Consultants: Cardiology.  Gastroenterology.  Nephrology Procedures performed: Right heart catheterization.  EGD Disposition: Home Diet recommendation:  Discharge Diet Orders (From admission, onward)     Start     Ordered   05/04/22 0000  Diet - low sodium heart healthy        05/04/22  1430           Cardiac and Carb modified diet DISCHARGE MEDICATION: Allergies as of 05/04/2022       Reactions   Esomeprazole Magnesium Other (See Comments)   Suspected interstitial nephritis 2018   Egg-derived Products Rash        Medication List     STOP taking these medications    canagliflozin 100 MG Tabs tablet Commonly known as: INVOKANA   carvedilol 6.25 MG tablet Commonly known as: COREG   metolazone 2.5 MG tablet Commonly known as: ZAROXOLYN   potassium chloride SA 20 MEQ tablet Commonly known as: KLOR-CON M   traMADol 50 MG tablet Commonly known as: Ultram       TAKE these medications    allopurinol 100 MG tablet Commonly known as: ZYLOPRIM Take 0.5 tablets (50 mg total) by mouth daily.   amiodarone 400 MG tablet Commonly known as: PACERONE Take 1 tablet (400 mg total) by mouth daily. What changed:  medication strength how much to take when to take this   apixaban 5 MG Tabs tablet Commonly known as: ELIQUIS Take 1 tablet (5 mg total) by mouth 2 (two) times daily.   atorvastatin 80 MG tablet Commonly known as: LIPITOR Take 1 tablet (  80 mg total) by mouth daily.   empagliflozin 10 MG Tabs tablet Commonly known as: Jardiance Take 1 tablet (10 mg total) by mouth daily.   famotidine 20 MG tablet Commonly known as: PEPCID Take 1 tablet (20 mg total) by mouth daily. Start taking on: May 05, 2022   ferrous gluconate 324 MG tablet Commonly known as: FERGON Take 324 mg by mouth daily with breakfast.   levothyroxine 100 MCG tablet Commonly known as: SYNTHROID Take 1 tablet (100 mcg total) by mouth daily before breakfast.   lipase/protease/amylase 36000 UNITS Cpep capsule Commonly known as: CREON Take 1 capsule (36,000 Units total) by mouth 3 (three) times daily with meals.   ondansetron 4 MG tablet Commonly known as: ZOFRAN Take 1 tablet (4 mg total) by mouth every 6 (six) hours as needed for nausea.   polyethylene glycol 17 g  packet Commonly known as: MIRALAX / GLYCOLAX Take 34 g by mouth 2 (two) times daily.   torsemide 20 MG tablet Commonly known as: DEMADEX Take 3 tablets (60 mg total) by mouth daily. What changed: how much to take        Discharge Exam: Filed Weights   05/01/22 0600 05/02/22 0403 05/03/22 0355  Weight: 104.5 kg 104.9 kg 104.8 kg   General.  Well-developed gentleman, in no acute distress. Pulmonary.  Lungs clear bilaterally, normal respiratory effort. CV.  Regular rate and rhythm, no JVD, rub or murmur. Abdomen.  Soft, nontender, nondistended, BS positive. CNS.  Alert and oriented .  No focal neurologic deficit. Extremities.  No edema, no cyanosis, pulses intact and symmetrical. Psychiatry.  Judgment and insight appears normal.   Condition at discharge: stable  The results of significant diagnostics from this hospitalization (including imaging, microbiology, ancillary and laboratory) are listed below for reference.   Imaging Studies: ECHO TEE  Result Date: 05/01/2022    TRANSESOPHOGEAL ECHO REPORT   Patient Name:   PRISH TRIMPER Date of Exam: 05/01/2022 Medical Rec #:  [id number]           Height:       71.0 in Accession #:    [id number]          Weight:       230.4 lb Date of Birth:  1965-09-06           BSA:          2.239 m Patient Age:    35 years            BP:           115/83 mmHg Patient Gender: M                   HR:           99 bpm. Exam Location:  [facility] Procedure: Transesophageal Echo, Cardiac Doppler and Color Doppler Indications:     Not listed on TEE check-in sheet  History:         Patient has prior history of Echocardiogram examinations, most                  recent 12/08/2021. Stroke; Risk Factors:Hypertension. PAF,                  alcohol abuse.  Sonographer:     Sherrie Sport Referring Phys:  Mont Belvieu Diagnosing Phys: Franki Monte PROCEDURE: The transesophogeal probe was passed without difficulty through the esophogus of the patient. Sedation  performed by different physician. The patient  developed no complications during the procedure.  IMPRESSIONS  1. No LV thrombus. Left ventricular ejection fraction, by estimation, is <20%. The left ventricle has severely decreased function. The left ventricular internal cavity size was mildly dilated.  2. Right ventricular systolic function is moderately reduced. The right ventricular size is normal.  3. Smoke but no thrombus in LA appendage. Left atrial size was moderately dilated. No left atrial/left atrial appendage thrombus was detected.  4. Right atrial size was mildly dilated.  5. Functional mitral regurgitation with restricted posterior leaflet. The mitral valve is abnormal. Moderate mitral valve regurgitation. No evidence of mitral stenosis.  6. The aortic valve is tricuspid. Aortic valve regurgitation is not visualized. No aortic stenosis is present.  7. Normal caliber thoracic aorta with minimal plaque.  8. No PFO or ASD by color doppler. FINDINGS  Left Ventricle: No LV thrombus. Left ventricular ejection fraction, by estimation, is <20%. The left ventricle has severely decreased function. The left ventricular internal cavity size was mildly dilated. There is no left ventricular hypertrophy. Right Ventricle: The right ventricular size is normal. No increase in right ventricular wall thickness. Right ventricular systolic function is moderately reduced. Left Atrium: Smoke but no thrombus in LA appendage. Left atrial size was moderately dilated. No left atrial/left atrial appendage thrombus was detected. Right Atrium: Right atrial size was mildly dilated. Pericardium: There is no evidence of pericardial effusion. Mitral Valve: Functional mitral regurgitation with restricted posterior leaflet. The mitral valve is abnormal. Moderate mitral valve regurgitation. No evidence of mitral valve stenosis. Tricuspid Valve: The tricuspid valve is normal in structure. Tricuspid valve regurgitation is trivial. Aortic Valve:  The aortic valve is tricuspid. Aortic valve regurgitation is not visualized. No aortic stenosis is present. Pulmonic Valve: The pulmonic valve was normal in structure. Pulmonic valve regurgitation is not visualized. Aorta: Normal caliber thoracic aorta with minimal plaque. The aortic root is normal in size and structure. IAS/Shunts: No PFO or ASD by color doppler. [employer] Electronically signed by Franki Monte Signature Date/Time: 05/01/2022/3:37:20 PM    Final    DG Chest Port 1 View  Result Date: 04/28/2022 CLINICAL DATA:  Central venous catheter placement. EXAM: PORTABLE CHEST 1 VIEW COMPARISON:  Chest radiograph dated 04/25/2022. FINDINGS: Right IJ central venous line with tip at the cavoatrial junction. There is cardiomegaly with vascular congestion and edema, progressed since the prior radiograph. Superimposed pneumonia is not excluded. Small bilateral pleural effusions suspected. No pneumothorax. No acute osseous pathology. IMPRESSION: 1. Right IJ central venous line with tip at the cavoatrial junction. 2. Cardiomegaly with vascular congestion and edema. Electronically Signed   By: Anner Crete M.D.   On: 04/28/2022 17:15   CARDIAC CATHETERIZATION  Result Date: 04/28/2022 Conclusions: Severely elevated left and right heart filling pressures. Low Fick cardiac output/index. Successful placement of triple-lumen central venous catheter via the right internal jugular vein. Recommendations: Transfer to stepdown. Initiate milrinone 0.25 mcg/kg/min, with titration based on urine output and cooximetry. Hold carvedilol in the setting of low-output heart failure. Restart furosemide 80 mg IV BID; titrate as needed for net negative fluid balance of at least 2 L per 24 hours. Nelva Bush, MD [facility]  US Abdomen Limited RUQ (LIVER/GB)  Result Date: 04/26/2022 CLINICAL DATA:  Elevated bilirubin EXAM: ULTRASOUND ABDOMEN LIMITED RIGHT UPPER QUADRANT COMPARISON:  02/23/2022 CT FINDINGS:  Gallbladder: Gallbladder is well distended with evidence of gallbladder sludge and cholelithiasis. Negative sonographic Murphy's sign is noted. Common bile duct: Diameter: 3.6 mm. Liver: Mild nodularity in the  liver is noted suspicious for underlying cirrhosis. No focal mass is noted. Portal vein is patent on color Doppler imaging with normal direction of blood flow towards the liver. Other: None. IMPRESSION: Cholelithiasis and gallbladder sludge without complicating factors. Findings suspicious for hepatic cirrhosis. Electronically Signed   By: Inez Catalina M.D.   On: 04/26/2022 02:10   DG Chest 2 View  Result Date: 04/25/2022 CLINICAL DATA:  SOB EXAM: CHEST - 2 VIEW COMPARISON:  Chest XR, 02/23/2022.  CT chest, 01/06/2022. FINDINGS: Cardiac silhouette is mildly enlarged. Lungs are hypoinflated. Perihilar interstitial thickening and trace fluid layering along minor fissure. No focal consolidation or mass. No pleural effusion or pneumothorax. No acute displaced fracture. IMPRESSION: Cardiomegaly and perihilar thickening, with layering fluid along the fissure. Findings suspicious for developing pulmonary edema. Electronically Signed   By: Michaelle Birks M.D.   On: 04/25/2022 18:54    Microbiology: Results for orders placed or performed during the hospital encounter of 04/25/22  Resp panel by RT-PCR (RSV, Flu A&B, Covid) Anterior Nasal Swab     Status: None   Collection Time: 04/25/22  8:39 PM   Specimen: Anterior Nasal Swab  Result Value Ref Range Status   SARS Coronavirus 2 by RT PCR NEGATIVE NEGATIVE Final    Comment: (NOTE) SARS-CoV-2 target nucleic acids are NOT DETECTED.  The SARS-CoV-2 RNA is generally detectable in upper respiratory specimens during the acute phase of infection. The lowest concentration of SARS-CoV-2 viral copies this assay can detect is 138 copies/mL. A negative result does not preclude SARS-Cov-2 infection and should not be used as the sole basis for treatment or other  patient management decisions. A negative result may occur with  improper specimen collection/handling, submission of specimen other than nasopharyngeal swab, presence of viral mutation(s) within the areas targeted by this assay, and inadequate number of viral copies(<138 copies/mL). A negative result must be combined with clinical observations, patient history, and epidemiological information. The expected result is Negative.  Fact Sheet for Patients:  EntrepreneurPulse.com.au  Fact Sheet for Healthcare Providers:  IncredibleEmployment.be  This test is no t yet approved or cleared by the [place] FDA and  has been authorized for detection and/or diagnosis of SARS-CoV-2 by FDA under an Emergency Use Authorization (EUA). This EUA will remain  in effect (meaning this test can be used) for the duration of the COVID-19 declaration under Section 564(b)(1) of the Act, 21 U.S.C.section 360bbb-3(b)(1), unless the authorization is terminated  or revoked sooner.       Influenza A by PCR NEGATIVE NEGATIVE Final   Influenza B by PCR NEGATIVE NEGATIVE Final    Comment: (NOTE) The Xpert Xpress SARS-CoV-2/FLU/RSV plus assay is intended as an aid in the diagnosis of influenza from Nasopharyngeal swab specimens and should not be used as a sole basis for treatment. Nasal washings and aspirates are unacceptable for Xpert Xpress SARS-CoV-2/FLU/RSV testing.  Fact Sheet for Patients: EntrepreneurPulse.com.au  Fact Sheet for Healthcare Providers: IncredibleEmployment.be  This test is not yet approved or cleared by the [place] FDA and has been authorized for detection and/or diagnosis of SARS-CoV-2 by FDA under an Emergency Use Authorization (EUA). This EUA will remain in effect (meaning this test can be used) for the duration of the COVID-19 declaration under Section 564(b)(1) of the Act, 21 U.S.C. section  360bbb-3(b)(1), unless the authorization is terminated or revoked.     Resp Syncytial Virus by PCR NEGATIVE NEGATIVE Final    Comment: (NOTE) Fact Sheet for Patients: EntrepreneurPulse.com.au  Fact Sheet  for Healthcare Providers: IncredibleEmployment.be  This test is not yet approved or cleared by the [place] and has been authorized for detection and/or diagnosis of SARS-CoV-2 by FDA under an Emergency Use Authorization (EUA). This EUA will remain in effect (meaning this test can be used) for the duration of the COVID-19 declaration under Section 564(b)(1) of the Act, 21 U.S.C. section 360bbb-3(b)(1), unless the authorization is terminated or revoked.  Performed at [facility], [address] [place], [place] [address]   MRSA Next Gen by PCR, Nasal     Status: Abnormal   Collection Time: 04/28/22  5:37 PM   Specimen: Nasal Mucosa; Nasal Swab  Result Value Ref Range Status   MRSA by PCR Next Gen DETECTED (A) NOT DETECTED Final    Comment: CRITICAL RESULT CALLED TO, READ BACK BY AND VERIFIED WITH: RN MARCEL TURNER ON 04/28/22 '@1940'$  RP (NOTE) The GeneXpert MRSA Assay (FDA approved for NASAL specimens only), is one component of a comprehensive MRSA colonization surveillance program. It is not intended to diagnose MRSA infection nor to guide or monitor treatment for MRSA infections. Test performance is not FDA approved in patients less than 66 years old. Performed at [facility], [address] [place], [place] [address]     Labs: CBC: Recent Labs  Lab 04/29/22 [id number] 04/30/22 0425 05/01/22 0459 05/02/22 0535 05/03/22 0620  WBC 8.3 7.8 8.7 10.1 7.5  HGB 11.6* 12.2* 12.2* 12.7* 12.6*  HCT 36.1* 37.3* 38.3* 38.8* 38.4*  MCV 84.9 84.6 84.9 84.0 84.8  PLT 194 209 225 238 [id number]   Basic Metabolic Panel: Recent Labs  Lab 04/29/22 0357 04/30/22 0425 05/01/22 0459 05/02/22 0535 05/03/22 0620 05/04/22 1012   NA 134* 134* 131* 130* 133* 131*  K 3.6 3.8 4.2 4.6 4.4 5.1  CL 97* 96* 92* 95* 98 96*  CO2 '25 27 28 23 26 23  '$ GLUCOSE 169* 110* 113* 133* 135* 107*  BUN 69* 66* 69* 70* 72* 73*  CREATININE 3.61* 3.51* 3.61* 3.33* 3.25* 3.36*  CALCIUM 8.2* 8.7* 8.9 9.1 8.8* 8.9  MG 2.5* 2.2 2.2 2.3 2.7*  --    Liver Function Tests: Recent Labs  Lab 04/28/22 0552 04/30/22 0425 05/02/22 0535 05/03/22 0620 05/04/22 1012  AST 21 52* '24 18 15  '$ ALT 20 63* 49* 38 30  ALKPHOS 93 111 105 106 87  BILITOT 4.6* 4.6* 2.4* 2.0* 2.7*  PROT 6.5 6.9 7.2 7.0 7.6  ALBUMIN 3.1* 3.3* 3.4* 3.4* 3.5   CBG: Recent Labs  Lab 04/29/22 2147  GLUCAP 101*    Discharge time spent: greater than 30 minutes.  This record has been created using Systems analyst. Errors have been sought and corrected,but may not always be located. Such creation errors do not reflect on the standard of care.   Signed: Lorella Nimrod, MD [facility] 05/04/2022

## 2022-05-04 NOTE — Transfer of Care (Signed)
Immediate Anesthesia Transfer of Care Note  Patient: Hector [name]  Procedure(s) Performed: ESOPHAGOGASTRODUODENOSCOPY (EGD) WITH PROPOFOL  Patient Location: PACU  Anesthesia Type:MAC  Level of Consciousness: drowsy  Airway & Oxygen Therapy: Patient Spontanous Breathing and Patient connected to face mask oxygen  Post-op Assessment: Report given to RN and Post -op Vital signs reviewed and stable  Post vital signs: Reviewed  Last Vitals:  Vitals Value Taken Time  BP 121/96 05/04/22 0849  Temp 35.9 C 05/04/22 0849  Pulse 72 05/04/22 0849  Resp 28 05/04/22 0849  SpO2 100 % 05/04/22 0849    Last Pain:  Vitals:   05/04/22 0849  TempSrc: Temporal  PainSc: Asleep      Patients Stated Pain Goal: 3 ([id number])  Complications: No notable events documented.

## 2022-05-04 NOTE — TOC Transition Note (Addendum)
Transition of Care [facility]) - CM/SW Discharge Note   Patient Details  Name: Hector [name] MRN: [id number] Date of Birth: 03/26/1965  Transition of Care [facility]) CM/SW Contact:  Candie Chroman, LCSW Phone Number: 05/04/2022, 5:06 PM   Clinical Narrative: Patient has orders to discharge home today. MD asked for home health for PT and RN. Patient is agreeable. [facility], [facility], and [facility] are unable to accept. [facility] is checking.  5:20 pm: [facility] is unable to accept. Patient is aware. No further concerns. CSW signing off.  Final next level of care: [place] Barriers to Discharge: Barriers Resolved   Patient Goals and CMS Choice      Discharge Placement                  Patient to be transferred to facility by: Brother   Patient and family notified of of transfer: 05/04/22  Discharge Plan and Services Additional resources added to the After Visit Summary for       Post Acute Care Choice: NA                               Social Determinants of Health (SDOH) Interventions SDOH Screenings   Food Insecurity: No Food Insecurity (02/25/2022)  Housing: Low Risk  (02/25/2022)  Transportation Needs: No Transportation Needs (02/25/2022)  Utilities: Not At Risk (02/25/2022)  Depression (PHQ2-9): Low Risk  (08/19/2021)  Financial Resource Strain: Low Risk  (01/04/2019)  Physical Activity: Inactive (01/04/2019)  Social Connections: Moderately Isolated (01/04/2019)  Stress: No Stress Concern Present (01/04/2019)  Tobacco Use: Medium Risk (05/04/2022)     Readmission Risk Interventions    04/27/2022   11:48 AM 02/24/2022    3:53 PM 12/09/2021    1:09 PM  Readmission Risk Prevention Plan  Transportation Screening Complete Complete Complete  Medication Review Press photographer) Complete Complete Complete  PCP or Specialist appointment within 3-5 days of discharge Complete  Complete  SW Recovery Care/Counseling Consult Complete  Complete Complete  Palliative Care Screening Not Applicable Not Applicable Not [place] Not Applicable Not Applicable Not Applicable

## 2022-05-04 NOTE — Telephone Encounter (Signed)
Patient Advocate Encounter  Prior Authorization for Jardiance 10 mg tablets has been approved.    PA# [id number] Confirmation#  Insurance [id number] W Effective dates: 05/04/2022 through 05/04/2023  Patients co-pay is $4.00.     Lyndel Safe, [place] Patient Advocate Specialist [place] Patient Advocate Team Direct Number: [phone]  Fax: [phone]

## 2022-05-05 ENCOUNTER — Encounter: Payer: Self-pay | Admitting: Gastroenterology

## 2022-05-05 LAB — SURGICAL PATHOLOGY

## 2022-05-10 NOTE — Progress Notes (Deleted)
[name], male    DOB: 06-24-1965, 57 y.o.   MRN: [id number]   Hector [name] is a 57 y/o male with a history of MI, hyperlipidemia, HTN, CKD, atrial fibrillation, alcohol use, cardiomyopathy, and CHF.   TEE 05/01/22: EF <20%, no LV thrombus, moderate Hector. Echo 12/08/21: EF of <20% along with severe LAE and moderate/ severe Hector. Echo 09/29/21: EF of <20% along with moderate LAE and moderate Hector. TEE 03/05/21: EF of <20% along with mild/moderate Hector but no thrombus. Echo 09/13/20: EF of 25-30% along with moderate Hector. Echo 12/03/16: EF of 45-50% which is an improvement from previous echo which was done on 06/02/16 and showed an EF of 30-35%. Prior echo on 02/21/15 which showed a normalization of his EF which was 50-55% with mild Hector. No longer ICD candidate. Prior echo on 03/07/14 showed an EF of 20-25% with mild/mod Hector.   [place] 04/28/22: Severely elevated left and right heart filling pressures. Low Fick cardiac output/index.  Left heart cath was done 01/09/15.   Admitted 04/25/22 due to a/c heart failure. IV diuresed. Veltassa given for hyperkalemia. RHC and EGD done. Started on milronone after cath. Successful DCCV. Renal ultrasound concerning for cirrhosis. Admitted 01/23/22 due to acute on chronic HF. Admitted 01/06/22 due to acute on chronic HF. Admitted 11/18/21 due to acute on chronic heart failure. Initially given IV lasix with transition to oral diuretics with diuresis of > 17L. Discharged after 6 days. Admitted twice in August.   He presents today for a HF follow-up visit with a chief complaint of    Past Medical History:  Diagnosis Date   Alcohol abuse    Alcoholic cardiomyopathy ([place])    a. 12/2007 MV: EF 28%, no isch;  b. 8/12 Echo: EF 25-35%; c. 02/2014 Echo: EF 20-25%; d. 12/2014 Cath: minimal CAD; e. 01/2015 Echo: EF 50-55%;  d. 05/2016 Echo: EF 30-35%, diff HK, gr2 DD; e. 11/2016 Echo: EF 45-50%, diff HK; f. 09/2021 Echo: EF <20%; g. 11/2021 Echo: EF<20%.   Chronic combined  systolic (congestive) and diastolic (congestive) heart failure ([place])    a. 05/2016 Echo: EF 30-35%; b. 11/2016 Echo: EF 45-50%, diff HK; c. 09/2021 Echo: EF < 20%; d. 11/2021 Echo: EF <20%, no rwma, Nl RV fxn, sev dil LA, mod-sev Hector. mod dil PA w/ mildly elev PASP.   CKD (chronic kidney disease), stage III (HCC)    Elevated troponin (chronic)    Essential hypertension    GI bleed 11/2013   Hyperlipidemia    Pancreatitis    Paroxysmal A-fib (HCC)    a. new onset s/p unsuccessful TEE/DCCV on 08/16/2014; b. CHA2DS2VASc = 3-> eliquis (freq noncompliant); c. 02/2021 s/p TEE/DCCV; d. Prev on amio->d/c 2/2 abnl TFTs.   Paroxysmal atrial flutter (HCC)    Sleep-disordered breathing    Has yet to have a sleep study   Stroke [facility])    Past Surgical History:  Procedure Laterality Date   CARDIAC CATHETERIZATION N/A 01/09/2015   Procedure: Left Heart Cath and Coronary Angiography;  Surgeon: [name], MD;  Location: [place] CV LAB;  Service: Cardiovascular;  Laterality: N/A;   CARDIOVERSION N/A 03/05/2021   Procedure: CARDIOVERSION;  Surgeon: Kate Sable, MD;  Location: [facility] ORS;  Service: Cardiovascular;  Laterality: N/A;   CENTRAL LINE INSERTION  04/28/2022   Procedure: CENTRAL LINE INSERTION;  Surgeon: Nelva Bush, MD;  Location: [place] CV LAB;  Service: Cardiovascular;;   COLONOSCOPY N/A 07/22/2020   Procedure: COLONOSCOPY;  Surgeon: Toledo, Benay Pike, MD;  Location: [facility] ENDOSCOPY;  Service: Gastroenterology;  Laterality: N/A;   ELECTROPHYSIOLOGIC STUDY N/A 08/16/2014   Procedure: CARDIOVERSION;  Surgeon: [name], MD;  Location: [facility] ORS;  Service: Cardiovascular;  Laterality: N/A;   ESOPHAGOGASTRODUODENOSCOPY (EGD) WITH PROPOFOL N/A 05/04/2022   Procedure: ESOPHAGOGASTRODUODENOSCOPY (EGD) WITH PROPOFOL;  Surgeon: Lin Landsman, MD;  Location: [facility] ENDOSCOPY;  Service: Gastroenterology;  Laterality: N/A;   FLEXIBLE SIGMOIDOSCOPY N/A 10/10/2015   Procedure:  FLEXIBLE SIGMOIDOSCOPY;  Surgeon: [name], MD;  Location: [facility] ENDOSCOPY;  Service: Endoscopy;  Laterality: N/A;   KNEE SURGERY Right    NM MYOVIEW LTD  November 2011   No ischemia or infarction. EF 50-55% ( no improvement from 2009 Myoview EF of 28%   RIGHT HEART CATH N/A 04/28/2022   Procedure: RIGHT HEART CATH;  Surgeon: Nelva Bush, MD;  Location: [place] CV LAB;  Service: Cardiovascular;  Laterality: N/A;   TEE WITHOUT CARDIOVERSION N/A 08/16/2014   Procedure: TRANSESOPHAGEAL ECHOCARDIOGRAM (TEE);  Surgeon: [name], MD;  Location: [facility] ORS;  Service: Cardiovascular;  Laterality: N/A;   TEE WITHOUT CARDIOVERSION N/A 03/05/2021   Procedure: TRANSESOPHAGEAL ECHOCARDIOGRAM (TEE);  Surgeon: Kate Sable, MD;  Location: [facility] ORS;  Service: Cardiovascular;  Laterality: N/A;   TEE WITHOUT CARDIOVERSION N/A 05/01/2022   Procedure: TRANSESOPHAGEAL ECHOCARDIOGRAM;  Surgeon: Larey Dresser, MD;  Location: [facility] ORS;  Service: Cardiovascular;  Laterality: N/A;  TEE with cardioversion   Family History  Problem Relation Age of Onset   Hypertension Mother    Hyperlipidemia Mother    Diabetes Mother    Social History   Tobacco Use   Smoking status: Former    Packs/day: 1.00    Years: 12.00    Additional pack years: 0.00    Total pack years: 12.00    Types: Cigarettes    Quit date: 02/23/1998    Years since quitting: 24.2   Smokeless tobacco: Never  Substance Use Topics   Alcohol use: Not Currently    Alcohol/week: 0.0 standard drinks of alcohol    Comment: Past heavy drinker   Allergies  Allergen Reactions   Esomeprazole Magnesium Other (See Comments)    Suspected interstitial nephritis 2018   Egg-Derived Products Rash     Review of Systems  Constitutional:  Positive for fatigue.  HENT:  Negative for congestion, postnasal drip and sore throat.   Eyes: Negative.   Respiratory:  Positive for shortness of breath (minimal). Negative for cough and chest  tightness.   Cardiovascular:  Negative for chest pain, palpitations and leg swelling.  Gastrointestinal:  Negative for abdominal distention, abdominal pain, constipation, diarrhea and nausea.  Endocrine: Negative.   Genitourinary: Negative.   Musculoskeletal:  Positive for arthralgias (left hand) and back pain. Negative for neck pain.  Skin: Negative.   Allergic/Immunologic: Negative.   Neurological:  Negative for dizziness, weakness and light-headedness.  Hematological:  Negative for adenopathy. Does not bruise/bleed easily.  Psychiatric/Behavioral:  Negative for dysphoric mood and sleep disturbance (sleeping on 2 pillows). The patient is not nervous/anxious.      Physical Exam Vitals and nursing note reviewed.  Constitutional:      General: He is not in acute distress.    Appearance: Normal appearance.  HENT:     Head: Normocephalic and atraumatic.  Neck:     Vascular: No JVD.  Cardiovascular:     Rate and Rhythm: Bradycardia present. Rhythm irregular.  Pulmonary:     Effort: Pulmonary effort is normal.  No respiratory distress.     Breath sounds: No wheezing or rales.  Abdominal:     General: There is no distension.     Palpations: Abdomen is soft.     Tenderness: There is no abdominal tenderness.  Musculoskeletal:     Cervical back: Normal range of motion and neck supple.     Right lower leg: No edema.     Left lower leg: No edema.  Skin:    General: Skin is warm and dry.  Neurological:     General: No focal deficit present.     Mental Status: He is alert and oriented to person, place, and time.     Motor: Weakness present.  Psychiatric:        Mood and Affect: Mood normal.        Behavior: Behavior normal.        Thought Content: Thought content normal.     Assessment & Plan:  1: Chronic heart failure with reduced ejection fraction- - NYHA Class II - euvolemic today - weighing daily; reminded to call for an overnight weight gain of > 2 pounds or a weekly  weight gain of > 5 pounds - weight 237 pounds from last visit here 3 months ago - TEE 05/01/22: EF <20%, no LV thrombus, moderate Hector. Echo 12/08/21: EF of <20% along with severe LAE and moderate/ severe Hector. Echo 09/29/21: EF of <20% along with moderate LAE and moderate Hector. TEE 03/05/21: EF of <20% along with mild/moderate Hector but no thrombus. Echo 09/13/20: EF of 25-30% along with moderate Hector. - RHC 04/28/22: Severely elevated left and right heart filling pressures. Low Fick cardiac output/index. - Left heart cath was done 01/09/15.  - jardiance - carvedilol   - renal function will limit other GDMT - saw [facility] provider (Bensimhon) 02/02/22  - check BMP today - not adding salt to his food - saw cardiology Merrilee Jansky) 01/30/22 - saw EP Quentin Ore) 04/30/21; not ICD candidate  - Etiology of LV dysfunction remains unclear but may have a component of tachy-CM with AF. Has been seen by EP and not felt to be candidate for ablation.  - BNP 04/26/22 was 2901.1  2: HTN- - BP  - follows with PCP at [place] from 05/04/22 reviewed and showed sodium 131, potassium 5.1, creatinine 3.36 and GFR 21 - saw nephrology ([place]) 06/11/21  3: Obstructive sleep apnea- - sleeping well at night  - says that he's not wearing his CPAP and needs to "get back to using it"; explained the correlation between sleep apnea and HTN/HF - encouraged to wear his CPAP every night and any time he's napping during the day  4: Atrial fibrillation- - successful DCCV during 04/2022 admission

## 2022-05-11 ENCOUNTER — Ambulatory Visit: Payer: Medicaid Other | Admitting: Family

## 2022-05-11 ENCOUNTER — Telehealth: Payer: Self-pay | Admitting: Family

## 2022-05-11 NOTE — Telephone Encounter (Signed)
Patient did not show for his Heart Failure Clinic appointment on 05/11/22.

## 2022-05-13 ENCOUNTER — Encounter: Payer: Medicaid Other | Admitting: Cardiology

## 2022-05-18 ENCOUNTER — Emergency Department: Payer: Medicaid Other

## 2022-05-18 ENCOUNTER — Other Ambulatory Visit: Payer: Self-pay

## 2022-05-18 ENCOUNTER — Emergency Department
Admission: EM | Admit: 2022-05-18 | Discharge: 2022-05-18 | Disposition: A | Discharge status: Home / Self Care | Payer: Medicaid Other | Attending: Emergency Medicine | Admitting: Emergency Medicine

## 2022-05-18 ENCOUNTER — Other Ambulatory Visit: Payer: Medicaid Other

## 2022-05-18 DIAGNOSIS — R0602 Shortness of breath: Secondary | ICD-10-CM | POA: Diagnosis present

## 2022-05-18 DIAGNOSIS — I509 Heart failure, unspecified: Secondary | ICD-10-CM | POA: Insufficient documentation

## 2022-05-18 DIAGNOSIS — R0609 Other forms of dyspnea: Secondary | ICD-10-CM | POA: Diagnosis not present

## 2022-05-18 DIAGNOSIS — Z8679 Personal history of other diseases of the circulatory system: Secondary | ICD-10-CM

## 2022-05-18 DIAGNOSIS — E8779 Other fluid overload: Secondary | ICD-10-CM | POA: Insufficient documentation

## 2022-05-18 DIAGNOSIS — R600 Localized edema: Secondary | ICD-10-CM | POA: Insufficient documentation

## 2022-05-18 LAB — BASIC METABOLIC PANEL
Anion gap: 13 (ref 5–15)
BUN: 37 mg/dL — ABNORMAL HIGH (ref 6–20)
CO2: 23 mmol/L (ref 22–32)
Calcium: 9 mg/dL (ref 8.9–10.3)
Chloride: 100 mmol/L (ref 98–111)
Creatinine, Ser: 2.94 mg/dL — ABNORMAL HIGH (ref 0.61–1.24)
GFR, Estimated: 24 mL/min — ABNORMAL LOW (ref >60)
Glucose, Bld: 81 mg/dL (ref 70–99)
Potassium: 5.5 mmol/L — ABNORMAL HIGH (ref 3.5–5.1)
Sodium: 136 mmol/L (ref 135–145)

## 2022-05-18 LAB — CBC
HCT: 42.3 % (ref 39.0–52.0)
Hemoglobin: 13 g/dL (ref 13.0–17.0)
MCH: 27.3 pg (ref 26.0–34.0)
MCHC: 30.7 g/dL (ref 30.0–36.0)
MCV: 88.9 fL (ref 80.0–100.0)
Platelets: 252 10*3/uL (ref 150–400)
RBC: 4.76 MIL/uL (ref 4.22–5.81)
RDW: 20.3 % — ABNORMAL HIGH (ref 11.5–15.5)
WBC: 8.4 10*3/uL (ref 4.0–10.5)
nRBC: 0 % (ref 0.0–0.2)

## 2022-05-18 LAB — TROPONIN I (HIGH SENSITIVITY): Troponin I (High Sensitivity): 53 ng/L — ABNORMAL HIGH (ref <18)

## 2022-05-18 MED ORDER — METOLAZONE 2.5 MG PO TABS: 2.5000 mg | ORAL_TABLET | Freq: Every day | ORAL | 0 refills | Status: DC

## 2022-05-18 NOTE — Discharge Instructions (Addendum)
Please stop taking your Lasix and begin taking your torsemide (Demadex) and metolazone (Zaroxolyn) for the next 5 days or until follow-up at the heart failure clinic

## 2022-05-18 NOTE — ED Triage Notes (Signed)
Pt to ED for shob and "fluid build up in stomach" for 4 days. States needs stomach drained, reports has been taking meds as prescribed

## 2022-05-18 NOTE — ED Provider Notes (Signed)
[facility] Provider Note   Event Date/Time   First MD Initiated Contact with Patient 05/18/22 1727     (approximate) History  Shortness of Breath  HPI Hector [name] is a 57 y.o. male with a past medical history of CHF and paroxysmal atrial fibrillation who presents complaining of worsening edema to bilateral lower extremities/abdomen as well as worsening shortness of breath and dyspnea on exertion.  Patient states that he was supposed to be seen in heart failure clinic yesterday but was unable to make the visit.  Patient states that his medications for diuresis were recently changed but he does not know what he was taking or what was changed.  Patient states that he is currently taking 40 mg of Lasix twice a day. ROS: Patient currently denies any vision changes, tinnitus, difficulty speaking, facial droop, sore throat, chest pain, abdominal pain, nausea/vomiting/diarrhea, dysuria, or weakness/numbness/paresthesias in any extremity   Physical Exam  Triage Vital Signs: ED Triage Vitals  Enc Vitals Group     BP 05/18/22 1725 (!) 146/112     Pulse Rate 05/18/22 1725 76     Resp 05/18/22 1725 (!) 22     Temp 05/18/22 1725 98 F (36.7 C)     Temp src --      SpO2 05/18/22 1725 96 %     Weight 05/18/22 1724 246 lb (111.6 kg)     Height 05/18/22 1724 5\' 11"  (1.803 m)     Head Circumference --      Peak Flow --      Pain Score 05/18/22 1723 7     Pain Loc --      Pain Edu? --      Excl. in [place]? --    Most recent vital signs: Vitals:   05/18/22 1815 05/18/22 1900  BP:  (!) 139/102  Pulse: 65 66  Resp: 18 14  Temp:    SpO2: 96% 97%   General: Awake, oriented x4. CV:  Good peripheral perfusion.  Resp:  Normal effort.  Rales over bilateral lower lung fields Abd:  No distention.  Bedside ultrasound does not show any significant intraabdominal fluid collections Other:  Middle-aged obese African-American male laying in bed in no acute distress.  2+ pitting  edema to bilateral lower extremities ED Results / Procedures / Treatments  Labs (all labs ordered are listed, but only abnormal results are displayed) Labs Reviewed  BASIC METABOLIC PANEL - Abnormal; Notable for the following components:      Result Value   Potassium 5.5 (*)    BUN 37 (*)    Creatinine, Ser 2.94 (*)    GFR, Estimated 24 (*)    All other components within normal limits  CBC - Abnormal; Notable for the following components:   RDW 20.3 (*)    All other components within normal limits  TROPONIN I (HIGH SENSITIVITY) - Abnormal; Notable for the following components:   Troponin I (High Sensitivity) 53 (*)    All other components within normal limits  TROPONIN I (HIGH SENSITIVITY)   EKG ED ECG REPORT I, Naaman Plummer, the attending physician, personally viewed and interpreted this ECG. Date: 05/18/2022 EKG Time: 1728 Rate: 70 Rhythm: normal sinus rhythm QRS Axis: normal Intervals: normal ST/T Wave abnormalities: normal Narrative Interpretation: no evidence of acute ischemia RADIOLOGY ED MD interpretation: 2 view chest x-ray interpreted by me shows no evidence of acute abnormalities including no pneumonia, pneumothorax, or widened mediastinum.  Chronic enlarged cardiopericardial silhouette  with vascular congestion -Agree with radiology assessment Official radiology report(s): DG Chest 2 View  Result Date: 05/18/2022 CLINICAL DATA:  Shortness of breath EXAM: CHEST - 2 VIEW COMPARISON:  X-ray 04/28/2022 and older FINDINGS: Previous right IJ catheter no longer seen. No pneumothorax. Enlarged cardiopericardial silhouette with vascular congestion. No pneumothorax or effusion. No consolidation. Eventration of the right hemidiaphragm. Overlapping cardiac leads. Film is under penetrated. IMPRESSION: Enlarged cardiopericardial silhouette with vascular congestion. Previous right IJ catheter no longer seen. No pneumothorax Electronically Signed   By: Jill Side M.D.   On:  05/18/2022 18:47   PROCEDURES: Critical Care performed: No .1-3 Lead EKG Interpretation  Performed by: Naaman Plummer, MD Authorized by: Naaman Plummer, MD     Interpretation: normal     ECG rate:  71   ECG rate assessment: normal     Rhythm: sinus rhythm     Ectopy: none     Conduction: normal    MEDICATIONS ORDERED IN ED: Medications - No data to display IMPRESSION / MDM / [place] / ED COURSE  I reviewed the triage vital signs and the nursing notes.                             The patient is on the cardiac monitor to evaluate for evidence of arrhythmia and/or significant heart rate changes. Patient's presentation is most consistent with acute presentation with potential threat to life or bodily function. Endorses dyspnea, endorses LE edema Denies Non adherence to medication regimen  Workup: ECG, CBC, BMP, Troponin, BNP, CXR Findings: EKG: No STEMI and no evidence of Brugadas sign, delta wave, epsilon wave, significantly prolonged QTc, or malignant arrhythmia. CXR: Mild cardiomegaly and vascular congestion Based on history, exam and findings, presentation most consistent with acute on chronic heart failure. Low suspicion for PNA, ACS, tamponade, aortic dissection. Interventions: Patient has not hypoxic at this time, bedside ultrasound does not show any evidence of drainable fluid collections in the abdomen.  Disposition (Stable): Discharge    FINAL CLINICAL IMPRESSION(S) / ED DIAGNOSES   Final diagnoses:  Other hypervolemia  Dyspnea on exertion  Bilateral lower extremity edema  History of heart failure   Rx / DC Orders   ED Discharge Orders          Ordered    AMB referral to CHF clinic        05/18/22 1914    metolazone (ZAROXOLYN) 2.5 MG tablet  Daily        05/18/22 1941           Note:  This document was prepared using Dragon voice recognition software and may include unintentional dictation errors.   Naaman Plummer, MD 05/18/22  2007

## 2022-05-25 NOTE — Progress Notes (Signed)
[name], male    DOB: 10-22-1965, 57 y.o.   MRN: [id number]   Hector [name] is a 57 y/o male with a history of MI, hyperlipidemia, HTN, CKD, atrial fibrillation, alcohol use, cardiomyopathy, and CHF.   TEE 05/01/22: EF <20%, no LV thrombus, moderate Hector. Echo 12/08/21: EF of <20% along with severe LAE and moderate/ severe Hector. Echo 09/29/21: EF of <20% along with moderate LAE and moderate Hector. TEE 03/05/21: EF of <20% along with mild/moderate Hector but no thrombus. Echo 09/13/20: EF of 25-30% along with moderate Hector. Echo 12/03/16: EF of 45-50% which is an improvement from previous echo which was done on 06/02/16 and showed an EF of 30-35%. Prior echo on 02/21/15 which showed a normalization of his EF which was 50-55% with mild Hector. No longer ICD candidate. Prior echo on 03/07/14 showed an EF of 20-25% with mild/mod Hector.   [place] 04/28/22: Severely elevated left and right heart filling pressures. Low Fick cardiac output/index.  Left heart cath was done 01/09/15.   Was in the ED 05/18/22 due to worsening edema/ SOB. Stopped lasix, resumed torsemide and added metolazone 2.5mg  daily for 5 days or "until seen by HF clinic". Admitted 04/25/22 due to a/c heart failure. IV diuresed. Veltassa given for hyperkalemia. RHC and EGD done. Started on milronone after cath. Successful DCCV. Renal ultrasound concerning for cirrhosis. Admitted 01/23/22 due to acute on chronic HF. Admitted 01/06/22 due to acute on chronic HF.   He presents today for a HF follow-up visit with a chief complaint of minimal fatigue with moderate exertion. Chronic in nature. Has associated left hand pain along with this. Denies difficulty sleeping, dizziness, abdominal distention, palpitations, pedal edema, chest pain, SOB or cough.   Has scales but isn't weighing himself daily. When asked why not, he says "because I'm lazy". Hasn't been taking jardiance and he's unsure why.   Past Medical History:  Diagnosis Date   Alcohol abuse     Alcoholic cardiomyopathy ([place])    a. 12/2007 MV: EF 28%, no isch;  b. 8/12 Echo: EF 25-35%; c. 02/2014 Echo: EF 20-25%; d. 12/2014 Cath: minimal CAD; e. 01/2015 Echo: EF 50-55%;  d. 05/2016 Echo: EF 30-35%, diff HK, gr2 DD; e. 11/2016 Echo: EF 45-50%, diff HK; f. 09/2021 Echo: EF <20%; g. 11/2021 Echo: EF<20%.   Chronic combined systolic (congestive) and diastolic (congestive) heart failure ([place])    a. 05/2016 Echo: EF 30-35%; b. 11/2016 Echo: EF 45-50%, diff HK; c. 09/2021 Echo: EF < 20%; d. 11/2021 Echo: EF <20%, no rwma, Nl RV fxn, sev dil LA, mod-sev Hector. mod dil PA w/ mildly elev PASP.   CKD (chronic kidney disease), stage III (HCC)    Elevated troponin (chronic)    Essential hypertension    GI bleed 11/2013   Hyperlipidemia    Pancreatitis    Paroxysmal A-fib (HCC)    a. new onset s/p unsuccessful TEE/DCCV on 08/16/2014; b. CHA2DS2VASc = 3-> eliquis (freq noncompliant); c. 02/2021 s/p TEE/DCCV; d. Prev on amio->d/c 2/2 abnl TFTs.   Paroxysmal atrial flutter (HCC)    Sleep-disordered breathing    Has yet to have a sleep study   Stroke [facility])    Past Surgical History:  Procedure Laterality Date   CARDIAC CATHETERIZATION N/A 01/09/2015   Procedure: Left Heart Cath and Coronary Angiography;  Surgeon: [name], MD;  Location: [place] [facility];  Service: Cardiovascular;  Laterality: N/A;   CARDIOVERSION N/A 03/05/2021   Procedure: CARDIOVERSION;  Surgeon: Kate Sable, MD;  Location: [place] [facility];  Service: Cardiovascular;  Laterality: N/A;   CENTRAL LINE INSERTION  04/28/2022   Procedure: CENTRAL LINE INSERTION;  Surgeon: Nelva Bush, MD;  Location: [place] [facility];  Service: Cardiovascular;;   COLONOSCOPY N/A 07/22/2020   Procedure: COLONOSCOPY;  Surgeon: Toledo, Benay Pike, MD;  Location: [facility];  Service: Gastroenterology;  Laterality: N/A;   ELECTROPHYSIOLOGIC STUDY N/A 08/16/2014   Procedure: CARDIOVERSION;  Surgeon: [name], MD;  Location: [facility];   Service: Cardiovascular;  Laterality: N/A;   ESOPHAGOGASTRODUODENOSCOPY (EGD) WITH PROPOFOL N/A 05/04/2022   Procedure: ESOPHAGOGASTRODUODENOSCOPY (EGD) WITH PROPOFOL;  Surgeon: Lin Landsman, MD;  Location: [facility];  Service: Gastroenterology;  Laterality: N/A;   FLEXIBLE SIGMOIDOSCOPY N/A 10/10/2015   Procedure: FLEXIBLE SIGMOIDOSCOPY;  Surgeon: [name], MD;  Location: [facility];  Service: Endoscopy;  Laterality: N/A;   KNEE SURGERY Right    NM MYOVIEW LTD  November 2011   No ischemia or infarction. EF 50-55% ( no improvement from 2009 Myoview EF of 28%   RIGHT HEART CATH N/A 04/28/2022   Procedure: RIGHT HEART CATH;  Surgeon: Nelva Bush, MD;  Location: [place] [facility];  Service: Cardiovascular;  Laterality: N/A;   TEE WITHOUT CARDIOVERSION N/A 08/16/2014   Procedure: TRANSESOPHAGEAL ECHOCARDIOGRAM (TEE);  Surgeon: [name], MD;  Location: [facility];  Service: Cardiovascular;  Laterality: N/A;   TEE WITHOUT CARDIOVERSION N/A 03/05/2021   Procedure: TRANSESOPHAGEAL ECHOCARDIOGRAM (TEE);  Surgeon: Kate Sable, MD;  Location: [facility];  Service: Cardiovascular;  Laterality: N/A;   TEE WITHOUT CARDIOVERSION N/A 05/01/2022   Procedure: TRANSESOPHAGEAL ECHOCARDIOGRAM;  Surgeon: Larey Dresser, MD;  Location: [facility];  Service: Cardiovascular;  Laterality: N/A;  TEE with cardioversion   Family History  Problem Relation Age of Onset   Hypertension Mother    Hyperlipidemia Mother    Diabetes Mother    Social History   Tobacco Use   Smoking status: Former    Packs/day: 1.00    Years: 12.00    Additional pack years: 0.00    Total pack years: 12.00    Types: Cigarettes    Quit date: 02/23/1998    Years since quitting: 24.2   Smokeless tobacco: Never  Substance Use Topics   Alcohol use: Not Currently    Alcohol/week: 0.0 standard drinks of alcohol    Comment: Past heavy drinker   Allergies  Allergen Reactions   Esomeprazole Magnesium Other  (See Comments)    Suspected interstitial nephritis 2018   Egg-Derived Products Rash   Prior to Admission medications   Medication Sig Start Date End Date Taking? Authorizing Provider  allopurinol (ZYLOPRIM) 100 MG tablet Take 0.5 tablets (50 mg total) by mouth daily. 10/03/21  Yes Danford, Suann Larry, MD  amiodarone (PACERONE) 400 MG tablet Take 1 tablet (400 mg total) by mouth daily. 05/04/22  Yes Lorella Nimrod, MD  apixaban (ELIQUIS) 5 MG TABS tablet Take 1 tablet (5 mg total) by mouth 2 (two) times daily. 10/02/21  Yes Danford, Suann Larry, MD  atorvastatin (LIPITOR) 80 MG tablet Take 1 tablet (80 mg total) by mouth daily. 11/24/21  Yes Annita Brod, MD  famotidine (PEPCID) 20 MG tablet Take 1 tablet (20 mg total) by mouth daily. 05/05/22  Yes Lorella Nimrod, MD  ferrous gluconate (FERGON) 324 MG tablet Take 324 mg by mouth daily with breakfast. 12/22/21  Yes [provider]  levothyroxine (SYNTHROID) 100 MCG tablet Take 1 tablet (100 mcg total)  by mouth daily before breakfast. 11/24/21  Yes Annita Brod, MD  lipase/protease/amylase (CREON) 36000 UNITS CPEP capsule Take 1 capsule (36,000 Units total) by mouth 3 (three) times daily with meals. 05/04/22  Yes Lorella Nimrod, MD  metolazone (ZAROXOLYN) 2.5 MG tablet Take 1 tablet (2.5 mg total) by mouth daily for 10 days. 05/18/22 05/28/22 Yes Bradler, Vista Lawman, MD  ondansetron (ZOFRAN) 4 MG tablet Take 1 tablet (4 mg total) by mouth every 6 (six) hours as needed for nausea. 05/04/22  Yes Lorella Nimrod, MD  polyethylene glycol (MIRALAX / GLYCOLAX) 17 g packet Take 34 g by mouth 2 (two) times daily. 05/04/22  Yes Lorella Nimrod, MD  torsemide (DEMADEX) 20 MG tablet Take 3 tablets (60 mg total) by mouth daily. 05/04/22  Yes Lorella Nimrod, MD  empagliflozin (JARDIANCE) 10 MG TABS tablet Take 1 tablet (10 mg total) by mouth daily. 05/26/22   Alisa Graff, FNP   Review of Systems  Constitutional:  Positive for fatigue. Negative for appetite  change.  HENT:  Negative for congestion, postnasal drip and sore throat.   Eyes: Negative.   Respiratory:  Negative for cough, chest tightness and shortness of breath.   Cardiovascular:  Negative for chest pain, palpitations and leg swelling.  Gastrointestinal:  Negative for abdominal distention, abdominal pain, constipation, diarrhea and nausea.  Endocrine: Negative.   Genitourinary: Negative.   Musculoskeletal:  Positive for arthralgias (left hand). Negative for back pain and neck pain.  Skin: Negative.   Allergic/Immunologic: Negative.   Neurological:  Negative for dizziness, weakness and light-headedness.  Hematological:  Negative for adenopathy. Does not bruise/bleed easily.  Psychiatric/Behavioral:  Negative for dysphoric mood and sleep disturbance (sleeping on 2 pillows). The patient is not nervous/anxious.    Vitals:   05/26/22 1348  BP: (!) 128/95  Pulse: 66  Resp: 16  SpO2: 98%  Weight: 239 lb 8 oz (108.6 kg)   Wt Readings from Last 3 Encounters:  05/26/22 239 lb 8 oz (108.6 kg)  05/18/22 246 lb (111.6 kg)  05/03/22 231 lb 0.7 oz (104.8 kg)   Lab Results  Component Value Date   CREATININE 2.94 (H) 05/18/2022   CREATININE 3.36 (H) 05/04/2022   CREATININE 3.25 (H) 05/03/2022   Physical Exam Vitals and nursing note reviewed.  Constitutional:      General: He is not in acute distress.    Appearance: Normal appearance.  HENT:     Head: Normocephalic and atraumatic.  Neck:     Vascular: No JVD.  Cardiovascular:     Rate and Rhythm: Normal rate and regular rhythm.  Pulmonary:     Effort: Pulmonary effort is normal. No respiratory distress.     Breath sounds: No wheezing or rales.  Abdominal:     General: There is no distension.     Palpations: Abdomen is soft.     Tenderness: There is no abdominal tenderness.  Musculoskeletal:        General: No tenderness.     Cervical back: Normal range of motion and neck supple.     Right lower leg: No edema.     Left  lower leg: No edema.  Skin:    General: Skin is warm and dry.  Neurological:     General: No focal deficit present.     Mental Status: He is alert and oriented to person, place, and time.  Psychiatric:        Mood and Affect: Mood normal.  Behavior: Behavior normal.        Thought Content: Thought content normal.    Assessment & Plan:  1: Chronic heart failure with reduced ejection fraction- - NYHA Class II - euvolemic today - not weighing daily although he has scales; emphasized weighing daily and to call for an overnight weight gain of > 2 pounds or a weekly weight gain of > 5 pounds - weight up 2 pounds from last visit here 3 months ago - TEE 05/01/22: EF <20%, no LV thrombus, moderate Hector. Echo 12/08/21: EF of <20% along with severe LAE and moderate/ severe Hector. Echo 09/29/21: EF of <20% along with moderate LAE and moderate Hector. TEE 03/05/21: EF of <20% along with mild/moderate Hector but no thrombus. Echo 09/13/20: EF of 25-30% along with moderate Hector. - RHC 04/28/22: Severely elevated left and right heart filling pressures. Low Fick cardiac output/index. - Left heart cath was done 01/09/15.  - jardiance 10mg  daily; this was refilled today - currently on metolazone 2.5mg  for another couple of days per EDP - BMET can be checked next week at cardiology appt - renal function will limit other GDMT - saw [facility] provider (Bensimhon) 02/02/22  - not adding salt to his food - saw cardiology Sharolyn Douglas) 01/30/22; f/u scheduled for 06/02/22 - saw EP Quentin Ore) 04/30/21; not ICD candidate  - Etiology of LV dysfunction remains unclear but may have a component of tachy-CM with AF. Has been seen by EP and not felt to be candidate for ablation.  - BNP 04/26/22 was 2901.1  2: HTN- - BP 128/95 - follows with PCP at [facility] from 05/18/22 reviewed and showed sodium 136, potassium 5.5, creatinine 2.94 and GFR 24 - saw nephrology ([place]) 06/11/21; office # provided so that he can  call and schedule f/u  3: Obstructive sleep apnea- - sleeping well at night  - says that he's not wearing his CPAP and needs to "get back to using it"; explained the correlation between sleep apnea and HTN/HF - encouraged to wear his CPAP every night and any time he's napping during the day  4: Atrial fibrillation- - successful DCCV during 04/2022 admission - continue amiodarone 400mg  daily   Return in 1 month, sooner if needed.

## 2022-05-26 ENCOUNTER — Telehealth: Payer: Self-pay | Admitting: Cardiovascular Disease

## 2022-05-26 ENCOUNTER — Encounter: Payer: Self-pay | Admitting: Family

## 2022-05-26 ENCOUNTER — Ambulatory Visit: Payer: Medicaid Other | Attending: Family | Admitting: Family

## 2022-05-26 VITALS — BP 128/95 | HR 66 | Resp 16 | Wt 239.5 lb

## 2022-05-26 DIAGNOSIS — I130 Hypertensive heart and chronic kidney disease with heart failure and stage 1 through stage 4 chronic kidney disease, or unspecified chronic kidney disease: Secondary | ICD-10-CM | POA: Insufficient documentation

## 2022-05-26 DIAGNOSIS — I5022 Chronic systolic (congestive) heart failure: Secondary | ICD-10-CM | POA: Insufficient documentation

## 2022-05-26 DIAGNOSIS — Z87891 Personal history of nicotine dependence: Secondary | ICD-10-CM | POA: Insufficient documentation

## 2022-05-26 DIAGNOSIS — I4819 Other persistent atrial fibrillation: Secondary | ICD-10-CM

## 2022-05-26 DIAGNOSIS — I10 Essential (primary) hypertension: Secondary | ICD-10-CM

## 2022-05-26 DIAGNOSIS — I4891 Unspecified atrial fibrillation: Secondary | ICD-10-CM | POA: Insufficient documentation

## 2022-05-26 DIAGNOSIS — Z79899 Other long term (current) drug therapy: Secondary | ICD-10-CM | POA: Insufficient documentation

## 2022-05-26 DIAGNOSIS — N183 Chronic kidney disease, stage 3 unspecified: Secondary | ICD-10-CM | POA: Insufficient documentation

## 2022-05-26 DIAGNOSIS — G4733 Obstructive sleep apnea (adult) (pediatric): Secondary | ICD-10-CM | POA: Diagnosis not present

## 2022-05-26 DIAGNOSIS — Z8249 Family history of ischemic heart disease and other diseases of the circulatory system: Secondary | ICD-10-CM | POA: Diagnosis not present

## 2022-05-26 MED ORDER — EMPAGLIFLOZIN 10 MG PO TABS: 10.0000 mg | ORAL_TABLET | Freq: Every day | ORAL | 5 refills | Status: DC

## 2022-05-26 MED ORDER — AMIODARONE HCL 200 MG PO TABS: ORAL_TABLET | ORAL | 0 refills | Status: DC

## 2022-05-26 NOTE — Patient Instructions (Addendum)
Begin weighing daily and call for an overnight weight gain of 3 pounds or more or a weekly weight gain of more than 5 pounds.  Please call [facility] to schedule an appointment. The number is [phone]  Refill request for amiodarone sent to [place]. Cardiologist appointment scheduled for 06/02/22 at 1120 AM

## 2022-05-26 NOTE — Telephone Encounter (Signed)
Spoke with patient and he saw Heart Failure today. Instructed him on medication and he verbalized understanding with no further questions at this time.

## 2022-05-26 NOTE — Telephone Encounter (Signed)
Heart failure clinic called inquiring about refill for patients amiodarone at 400 mg daily. Will check with provider.

## 2022-05-26 NOTE — Telephone Encounter (Signed)
*  STAT* If patient is at the pharmacy, call can be transferred to refill team.   1. Which medications need to be refilled? (please list name of each medication and dose if known) amiodarone (PACERONE) 400 MG tablet   2. Which pharmacy/location (including street and city if local pharmacy) is medication to be sent to?  [place] ([facility] [place] - [address]    3. Do they need a 30 day or 90 day supply? [place]

## 2022-05-26 NOTE — Telephone Encounter (Signed)
Patient was discharged on amiodarone 400 mg daily on 3/11 at the direction of advanced heart failure service and was given 15 tabs at time of discharge.  If patient was taking as directed he should have ran out on 3/26.  Would refill amiodarone 200 mg twice daily for 1 week then 200 mg daily thereafter.  No refills.  Would recommend appointment with advanced heart failure service be moved up.

## 2022-05-26 NOTE — Telephone Encounter (Signed)
Hi,  It seems like the heart failure clinic called the call center requesting a refill for amiodarone for the patient. The medication was prescribed at the time of discharge from the hospital on 05-04-2022. He was only prescribed 15 tablets at that time. The patient has an appointment to see Thurmond Butts on 06-02-2022. Please review and advise if ok to refill prescription. Thank you.

## 2022-06-01 NOTE — Progress Notes (Signed)
Cardiology Office Note    Date:  06/02/2022   ID:  Hector [name], DOB 13-Jan-1966, MRN [id number]  PCP:  Oswaldo Conroy, MD  Cardiologist:  Julien Nordmann, MD  Electrophysiologist:  None   Chief Complaint: Hospital follow-up  History of Present Illness:   Hector [name] is a 57 y.o. 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  His heart failure history dates back to at least 2009, and was felt to be secondary to alcohol use and atrial arrhythmias with A-fib/flutter with RVR.  Echo was 20% at that time.  In the setting of noncompliance with anticoagulation and follow-up, he was rate controlled for a prolonged period of time.  He subsequently underwent multiple noninvasive ischemic evaluations, which were low risk.  He ultimately underwent diagnostic LHC in 2016 showed minimal, nonobstructive CAD.  DCCV was carried out in 01/2015 with restoration of sinus rhythm leading to an improvement in his EF.  He was later placed on amiodarone, but this was subsequently discontinued secondary to abnormal thyroid function testing.  He has since been on levothyroxine.  More recently, in 02/2021, he was readmitted with atrial flutter/fibrillation and heart failure.  TEE guided DCCV was carried out with restoration of sinus rhythm.  EF is less than 20% with severely reduced RV function at that time.  He was seen by electrophysiology in 04/2021 and reported noncompliance with medications.  In that setting, he was not felt to be a suitable candidate for catheter ablation or ICD therapy.  In 08/2021, he was admitted with complaints of abdominal discomfort and distention.   He underwent extensive GI workup including MRCP and HIDA scan with recommendation for conservative therapy.  He was discharged home off diuretic therapy due to worsening renal function, though required readmission in 09/2021 secondary to volume overload and noncompliance.  Upon admission, he was in sinus rhythm with PACs.  However, he subsequently developed A-fib with RVR during the admission.  Echo during the admission showed an EF of less than 20%.  Notes indicate he has subsequently skipped his medications and has not been weighing himself at home.  In this setting, he was readmitted with recurrent heart failure in late 09/2021, late 10/2021, mid to 11/2021, mid 12/2021, 01/2022, 02/2022, and most recently in 04/2022 with diuresis guided at the assistance of nephrology in the setting of worsening renal failure.  He has now also been followed by the advanced heart failure service locally.  He was most recently admitted to the hospital from 3/2 through 05/04/2022 with recurrent volume overload and A-fib.  During the admission RHC showed severely elevated left and right heart filling pressures with low cardiac output/index.  Diuresis was augmented with milrinone.  He underwent successful TEE guided DCCV with TEE demonstrating an EF of less than 20% with mildly dilated LV internal cavity size, moderately reduced RV systolic function with normal ventricular cavity size, no evidence of thrombus in the left atrial appendage (smoke noted), mildly dilated right atrium, moderate mitral regurgitation, and no PFO or ASD.  EGD, performed for epigastric discomfort, during admission showed gastritis without varices.  Advanced heart failure recommended the patient be discharged on torsemide 60 mg daily, Farxiga 10  mg daily (replacing Invokana), apixaban 5 mg twice daily, and amiodarone 400 g daily.  They recommended discontinuing carvedilol and spironolactone with follow-up with heart failure.  Since his hospital discharge, he was  evaluated in the ED on 05/18/2022 with worsening edema and shortness of breath.  He reported having missed his heart failure visit.  He reported his medications were changed and was not sure what he was taking or what was changed.  He reported he was taking 40 of Lasix twice daily.  Troponin was mildly elevated at 53, consistent with prior readings.  Chest x-ray showed enlarged cardiac silhouette with vascular congestion.  He was discharged from the ED with 2.5 mg of metolazone daily for 10 days.  He followed up with the [facility] CHF clinic on 05/26/2022 and reported he was not weighing himself daily with no indicating "because I am lazy."  Note indicated the patient was euvolemic.  Labs following daily metolazone administration were deferred to general cardiology visit.  Patient subsequently contacted our office on 05/26/2022 requesting a refill of amiodarone.  Upon chart review, the patient was given just 15 tabs of amiodarone upon discharge on 3/11.  He comes in today and is doing well from a cardiac perspective.  No symptoms of angina, dyspnea, or cardiac decompensation.  No lower extremity swelling progressive abdominal distention, orthopnea, or early satiety.  No PND.  Has multiple scales at home, not weighing himself.  Weight down 8 pounds when compared to his visit in our office in 01/2022, at which time he was euvolemic.  Trying to watch salt and fluid intake.  He reports that he is taking his medications, though does not know which medications he is taking.  He is scheduled to see advanced heart failure later this month.  No dizziness, presyncope, or syncope.   Labs independently reviewed: 04/2022 - Hgb 13.0, PLT 252, potassium 5.5, BUN 37, serum creatinine 2.94, albumin 3.5, AST/ALT normal, magnesium 2.7 12/2021 - A1c 6.8, TC 141, TG 140, HDL 25, LDL 88 09/2021 - TSH normal  Past Medical History:  Diagnosis Date   Alcohol abuse    Alcoholic cardiomyopathy    a. 86/5784 MV: EF 28%, no isch;  b.  8/12 Echo: EF 25-35%; c. 02/2014 Echo: EF 20-25%; d. 12/2014 Cath: minimal CAD; e. 01/2015 Echo: EF 50-55%;  d. 05/2016 Echo: EF 30-35%, diff HK, gr2 DD; e. 11/2016 Echo: EF 45-50%, diff HK; f. 09/2021 Echo: EF <20%; g. 11/2021 Echo: EF<20%.   Chronic combined systolic (congestive) and diastolic (congestive) heart failure    a. 05/2016 Echo: EF 30-35%; b. 11/2016 Echo: EF 45-50%, diff HK; c. 09/2021 Echo: EF < 20%; d. 11/2021 Echo: EF <20%, no rwma, Nl RV fxn, sev dil LA, mod-sev MR. mod dil PA w/ mildly elev PASP.   CKD (chronic kidney disease), stage III    Elevated troponin (chronic)    Essential hypertension    GI bleed 11/2013   Hyperlipidemia    Pancreatitis    Paroxysmal A-fib    a. new onset s/p unsuccessful TEE/DCCV on 08/16/2014; b. CHA2DS2VASc = 3-> eliquis (freq noncompliant); c. 02/2021 s/p TEE/DCCV; d. Prev on amio->d/c 2/2 abnl TFTs.   Paroxysmal atrial flutter    Sleep-disordered breathing    Has yet to have a sleep study   Stroke     Past Surgical History:  Procedure Laterality Date   CARDIAC CATHETERIZATION N/A 01/09/2015   Procedure: Left Heart Cath and Coronary Angiography;  Surgeon: Marykay Lex, MD;  Location:  [facility] INVASIVE CV [facility];  Service: Cardiovascular;  Laterality: N/A;   CARDIOVERSION N/A 03/05/2021   Procedure: CARDIOVERSION;  Surgeon: Debbe Odea, MD;  Location: [facility] ORS;  Service: Cardiovascular;  Laterality: N/A;   CENTRAL LINE INSERTION  04/28/2022   Procedure: CENTRAL LINE INSERTION;  Surgeon: Yvonne Kendall, MD;  Location: [facility] CV [facility];  Service: Cardiovascular;;   COLONOSCOPY N/A 07/22/2020   Procedure: COLONOSCOPY;  Surgeon: Toledo, Boykin Nearing, MD;  Location: [facility] ENDOSCOPY;  Service: Gastroenterology;  Laterality: N/A;   ELECTROPHYSIOLOGIC STUDY N/A 08/16/2014   Procedure: CARDIOVERSION;  Surgeon: Antonieta Iba, MD;  Location: [facility] ORS;  Service: Cardiovascular;  Laterality: N/A;   ESOPHAGOGASTRODUODENOSCOPY (EGD) WITH PROPOFOL N/A  05/04/2022   Procedure: ESOPHAGOGASTRODUODENOSCOPY (EGD) WITH PROPOFOL;  Surgeon: Toney Reil, MD;  Location: [facility] ENDOSCOPY;  Service: Gastroenterology;  Laterality: N/A;   FLEXIBLE SIGMOIDOSCOPY N/A 10/10/2015   Procedure: FLEXIBLE SIGMOIDOSCOPY;  Surgeon: Christena Deem, MD;  Location: [facility] ENDOSCOPY;  Service: Endoscopy;  Laterality: N/A;   KNEE SURGERY Right    NM MYOVIEW LTD  November 2011   No ischemia or infarction. EF 50-55% ( no improvement from 2009 Myoview EF of 28%   RIGHT HEART CATH N/A 04/28/2022   Procedure: RIGHT HEART CATH;  Surgeon: Yvonne Kendall, MD;  Location: [facility];  Service: Cardiovascular;  Laterality: N/A;   TEE WITHOUT CARDIOVERSION N/A 08/16/2014   Procedure: TRANSESOPHAGEAL ECHOCARDIOGRAM (TEE);  Surgeon: Antonieta Iba, MD;  Location: [facility] ORS;  Service: Cardiovascular;  Laterality: N/A;   TEE WITHOUT CARDIOVERSION N/A 03/05/2021   Procedure: TRANSESOPHAGEAL ECHOCARDIOGRAM (TEE);  Surgeon: Debbe Odea, MD;  Location: [facility] ORS;  Service: Cardiovascular;  Laterality: N/A;   TEE WITHOUT CARDIOVERSION N/A 05/01/2022   Procedure: TRANSESOPHAGEAL ECHOCARDIOGRAM;  Surgeon: Laurey Morale, MD;  Location: [facility] ORS;  Service: Cardiovascular;  Laterality: N/A;  TEE with cardioversion    Current Medications: Current Meds  Medication Sig   allopurinol (ZYLOPRIM) 100 MG tablet Take 0.5 tablets (50 mg total) by mouth daily.   amiodarone (PACERONE) 200 MG tablet Take 1 tablet (200 mg total) by mouth 2 (two) times daily for 7 days, THEN 1 tablet (200 mg total) daily.   apixaban (ELIQUIS) 5 MG TABS tablet Take 1 tablet (5 mg total) by mouth 2 (two) times daily.   atorvastatin (LIPITOR) 80 MG tablet Take 1 tablet (80 mg total) by mouth daily.   empagliflozin (JARDIANCE) 10 MG TABS tablet Take 1 tablet (10 mg total) by mouth daily.   famotidine (PEPCID) 20 MG tablet Take 1 tablet (20 mg total) by mouth daily.   ferrous gluconate (FERGON) 324 MG  tablet Take 324 mg by mouth daily with breakfast.   levothyroxine (SYNTHROID) 100 MCG tablet Take 1 tablet (100 mcg total) by mouth daily before breakfast.   lipase/protease/amylase (CREON) 36000 UNITS CPEP capsule Take 1 capsule (36,000 Units total) by mouth 3 (three) times daily with meals.   metolazone (ZAROXOLYN) 2.5 MG tablet Take 1 tablet (2.5 mg total) by mouth daily for 10 days.   ondansetron (ZOFRAN) 4 MG tablet Take 1 tablet (4 mg total) by mouth every 6 (six) hours as needed for nausea.   polyethylene glycol (MIRALAX / GLYCOLAX) 17 g packet Take 34 g by mouth 2 (two) times daily.   torsemide (DEMADEX) 20 MG tablet Take 3 tablets (60 mg total) by mouth daily.    Allergies:   Esomeprazole magnesium and Egg-derived products   Social History   Socioeconomic History  Marital status: Single    Spouse name: Not on file   Number of children: Not on file   Years of education: Not on file   Highest education level: Not on file  Occupational History   Not on file  Tobacco Use   Smoking status: Former    Packs/day: 1.00    Years: 12.00    Additional pack years: 0.00    Total pack years: 12.00    Types: Cigarettes    Quit date: 02/23/1998    Years since quitting: 24.2   Smokeless tobacco: Never  Vaping Use   Vaping Use: Never used  Substance and Sexual Activity   Alcohol use: Not Currently    Alcohol/week: 0.0 standard drinks of alcohol    Comment: Past heavy drinker   Drug use: No   Sexual activity: Not Currently  Other Topics Concern   Not on file  Social History Narrative   Not on file   Social Determinants of Health   Financial Resource Strain: Low Risk  (01/04/2019)   Overall Financial Resource Strain (CARDIA)    Difficulty of Paying Living Expenses: Not hard at all  Food Insecurity: No Food Insecurity (02/25/2022)   Hunger Vital Sign    Worried About Running Out of Food in the Last Year: Never true    Ran Out of Food in the Last Year: Never true  Transportation  Needs: No Transportation Needs (02/25/2022)   PRAPARE - Administrator, Civil Service (Medical): No    Lack of Transportation (Non-Medical): No  Physical Activity: Inactive (01/04/2019)   Exercise Vital Sign    Days of Exercise per Week: 0 days    Minutes of Exercise per Session: 0 min  Stress: No Stress Concern Present (01/04/2019)   [employer] of Occupational Health - Occupational Stress Questionnaire    Feeling of Stress : Not at all  Social Connections: Moderately Isolated (01/04/2019)   Social Connection and Isolation Panel [NHANES]    Frequency of Communication with Friends and Family: More than three times a week    Frequency of Social Gatherings with Friends and Family: More than three times a week    Attends Religious Services: 1 to 4 times per year    Active Member of [employer] or Organizations: No    Attends Banker Meetings: Never    Marital Status: Never married     Family History:  The patient's family history includes Diabetes in his mother; Hyperlipidemia in his mother; Hypertension in his mother.  ROS:   12-point review of systems is negative unless otherwise noted in the HPI.   EKGs/Labs/Other Studies Reviewed:    Studies reviewed were summarized above. The additional studies were reviewed today:  TEE 05/01/2022: 1. No LV thrombus. Left ventricular ejection fraction, by estimation, is  <20%. The left ventricle has severely decreased function. The left  ventricular internal cavity size was mildly dilated.   2. Right ventricular systolic function is moderately reduced. The right  ventricular size is normal.   3. Smoke but no thrombus in LA appendage. Left atrial size was moderately  dilated. No left atrial/left atrial appendage thrombus was detected.   4. Right atrial size was mildly dilated.   5. Functional mitral regurgitation with restricted posterior leaflet. The  mitral valve is abnormal. Moderate mitral valve regurgitation. No  evidence  of mitral stenosis.   6. The aortic valve is tricuspid. Aortic valve regurgitation is not  visualized. No aortic stenosis is present.  7. Normal caliber thoracic aorta with minimal plaque.   8. No PFO or ASD by color doppler.  __________  RHC 04/28/2022: Conclusions: Severely elevated left and right heart filling pressures. Low Fick cardiac output/index. Successful placement of triple-lumen central venous catheter via the right internal jugular vein.   Recommendations: Transfer to stepdown. Initiate milrinone 0.25 mcg/kg/min, with titration based on urine output and cooximetry. Hold carvedilol in the setting of low-output heart failure. Restart furosemide 80 mg IV BID; titrate as needed for net negative fluid balance of at least 2 L per 24 hours. __________  Limited echo 12/08/2021: 1. Left ventricular ejection fraction, by estimation, is <20%. The left  ventricle has severely decreased function. The left ventricle has no  regional wall motion abnormalities. The left ventricular internal cavity  size was mildly to moderately dilated.   2. Right ventricular systolic function is normal. The right ventricular  size is normal. There is mildly elevated pulmonary artery systolic  pressure.   3. Left atrial size was severely dilated.   4. Right atrial size was mildly dilated.   5. The mitral valve is normal in structure. Moderate to severe mitral  valve regurgitation. No evidence of mitral stenosis.   6. The aortic valve is normal in structure. Aortic valve regurgitation is  not visualized. No aortic stenosis is present.   7. Moderately dilated pulmonary artery.   8. The inferior vena cava is dilated in size with <50% respiratory  variability, suggesting right atrial pressure of 15 mmHg.  __________  2D echo 09/29/2021:  1. Left ventricular ejection fraction, by estimation, is <20%. The left  ventricle has severely decreased function. The left ventricle has no  regional  wall motion abnormalities. The left ventricular internal cavity  size was moderately dilated. Left  ventricular diastolic parameters are indeterminate.   2. Right ventricular systolic function is mildly reduced. The right  ventricular size is normal. Tricuspid regurgitation signal is inadequate  for assessing PA pressure.   3. Left atrial size was moderately dilated.   4. Right atrial size was mildly dilated.   5. The mitral valve is normal in structure. Moderate mitral valve  regurgitation. No evidence of mitral stenosis.   6. The aortic valve is normal in structure. Aortic valve regurgitation is  not visualized. Aortic valve sclerosis/calcification is present, without  any evidence of aortic stenosis.   7. The inferior vena cava is dilated in size with <50% respiratory  variability, suggesting right atrial pressure of 15 mmHg.  __________  TEE 03/05/2021: 1. Left ventricular ejection fraction, by estimation, is <20%. The left  ventricle has severely decreased function. The left ventricular internal  cavity size was mildly dilated.   2. Right ventricular systolic function is severely reduced. The right  ventricular size is normal.   3. Left atrial size was mildly dilated. No left atrial/left atrial  appendage thrombus was detected.   4. The mitral valve is normal in structure. Mild to moderate mitral valve  regurgitation.   5. The aortic valve is tricuspid. Aortic valve regurgitation is not  visualized.   6. Agitated saline contrast bubble study was negative, with no evidence  of any interatrial shunt. __________  Limited echo 10/29/2020: 1. Left ventricular ejection fraction, by estimation, is 30 to 35%. The  left ventricle has moderately decreased function. The left ventricle  demonstrates global hypokinesis. The left ventricular internal cavity size  was borderline dilated. There is mild   left ventricular hypertrophy.   2. Right ventricular systolic  function is mildly  reduced. The right  ventricular size is normal.   3. Mild to moderate mitral valve regurgitation.  __________  2D echo 09/13/2020: 1. Left ventricular ejection fraction, by estimation, is 25 to 30%. The  left ventricle has severely decreased function. The left ventricle  demonstrates global hypokinesis. The left ventricular internal cavity size  was mildly dilated. Left ventricular  diastolic parameters are indeterminate. The average left ventricular  global longitudinal strain is -6.7 %. The global longitudinal strain is  abnormal.   2. Right ventricular systolic function is moderately reduced. The right  ventricular size is normal. Tricuspid regurgitation signal is inadequate  for assessing PA pressure.   3. The mitral valve is normal in structure. Moderate mitral valve  regurgitation.   4. The inferior vena cava is dilated in size with <50% respiratory  variability, suggesting right atrial pressure of 15 mmHg.  __________  2D echo 01/31/2020: 1. Left ventricular ejection fraction, by estimation, is 45 to 50%. The  left ventricle has normal function. The left ventricle demonstrates  regional wall motion abnormalities. Abnormal (paradoxical) septal motion,  consistent with left bundle branch  block. Left ventricular diastolic parameters are indeterminate.   2. Right ventricular systolic function is normal. The right ventricular  size is normal. Tricuspid regurgitation signal is inadequate for assessing  PA pressure.   3. The mitral valve is normal in structure. No evidence of mitral valve  regurgitation.   4. The aortic valve was not well visualized. Aortic valve regurgitation  is not visualized. No aortic stenosis is present.   5. The inferior vena cava is normal in size with greater than 50%  respiratory variability, suggesting right atrial pressure of 3 mmHg.  __________  Limited echo 12/03/2016: - Procedure narrative: This was a LIMITED TTE to assess LVEF.  - Left  ventricle: The cavity size was mildly dilated. There was    mild concentric hypertrophy. Systolic function was mildly    reduced. The estimated ejection fraction was in the range of 45%    to 50%. Diffuse hypokinesis.  __________  2D echo 06/02/2016: - Left ventricle: The cavity size was mildly dilated. There was    mild concentric hypertrophy. Systolic function was moderately to    severely reduced. The estimated ejection fraction was in the    range of 30% to 35%. Diffuse hypokinesis. Features are consistent    with a pseudonormal left ventricular filling pattern, with    concomitant abnormal relaxation and increased filling pressure    (grade 2 diastolic dysfunction).  - Mitral valve: There was mild regurgitation.  - Left atrium: The atrium was moderately dilated.  - Pulmonary arteries: Systolic pressure could not be accurately    estimated.  __________  2D echo 02/21/2015: - Left ventricle: The cavity size was normal. There was mild    concentric hypertrophy. Systolic function was normal. The    estimated ejection fraction was in the range of 50% to 55%.    Unable to exclude inferior-posterior wall hypokinesis. Left    ventricular diastolic function parameters were normal.  - Mitral valve: There was mild regurgitation.  - Left atrium: The atrium was mildly dilated.  - Right ventricle: Systolic function was normal.  - Pulmonary arteries: Systolic pressure was within the normal    range.   Impressions:   - Challenging image quality. Rhythm is normal sinus.  __________  LHC 01/09/2015: Angiographically minimal CAD Non-ishcemic Cardiomyopathy Systemic Hypertension with Mild-Moderately increase LVEDP after pre-cath hydration.  This confirms the presumed diagnosis of Non-ischemic Cardiomyopathy.   Plan: Discharge home after Bed-rest & TR band removal Continue current Cardiomyopathy Medications - BB, Entresto & Lasix Will refer back to Dr. Graciela Husbands for reconsideration of  ICD.    EKG:  EKG is ordered today.  The EKG ordered today demonstrates NSR, 67 bpm, LBBB (known)  Recent Labs: 10/14/2021: TSH 3.714 04/26/2022: B Natriuretic Peptide 2,109.1 05/03/2022: Magnesium 2.7 05/04/2022: ALT 30 05/18/2022: BUN 37; Creatinine, Ser 2.94; Hemoglobin 13.0; Platelets 252; Potassium 5.5; Sodium 136  Recent Lipid Panel    Component Value Date/Time   CHOL 141 01/09/2022 0657   CHOL 249 (H) 10/11/2018 1226   CHOL 118 03/08/2014 0410   TRIG 140 01/09/2022 0657   TRIG 95 03/08/2014 0410   HDL 25 (L) 01/09/2022 0657   HDL 36 (L) 10/11/2018 1226   HDL 28 (L) 03/08/2014 0410   CHOLHDL 5.6 01/09/2022 0657   VLDL 28 01/09/2022 0657   VLDL 19 03/08/2014 0410   LDLCALC 88 01/09/2022 0657   LDLCALC 174 (H) 10/11/2018 1226   LDLCALC 71 03/08/2014 0410    PHYSICAL EXAM:    VS:  BP (!) 124/104   Pulse 67   Ht 5\' 11"  (1.803 m)   Wt 240 lb (108.9 kg)   SpO2 93%   BMI 33.47 kg/m   BMI: Body mass index is 33.47 kg/m.  Physical Exam Vitals reviewed.  Constitutional:      Appearance: He is well-developed.  HENT:     Head: Normocephalic and atraumatic.  Eyes:     General:        Right eye: No discharge.        Left eye: No discharge.  Neck:     Vascular: No JVD.  Cardiovascular:     Rate and Rhythm: Normal rate and regular rhythm.     Heart sounds: Normal heart sounds, S1 normal and S2 normal. Heart sounds not distant. No midsystolic click and no opening snap. No murmur heard.    No friction rub.  Pulmonary:     Effort: Pulmonary effort is normal. No respiratory distress.     Breath sounds: Normal breath sounds. No decreased breath sounds, wheezing or rales.  Chest:     Chest wall: No tenderness.  Abdominal:     General: There is no distension.     Palpations: Abdomen is soft.     Tenderness: There is no abdominal tenderness.  Musculoskeletal:     Cervical back: Normal range of motion.     Right lower leg: No edema.     Left lower leg: No edema.  Skin:     General: Skin is warm and dry.     Nails: There is no clubbing.  Neurological:     Mental Status: He is alert and oriented to person, place, and time.  Psychiatric:        Speech: Speech normal.        Behavior: Behavior normal.        Thought Content: Thought content normal.        Judgment: Judgment normal.     Wt Readings from Last 3 Encounters:  06/02/22 240 lb (108.9 kg)  05/26/22 239 lb 8 oz (108.6 kg)  05/18/22 246 lb (111.6 kg)     ASSESSMENT & PLAN:   Chronic combined systolic and diastolic CHF/biventricular dysfunction with NICM: Appears euvolemic at this time following recent hospital admission and outpatient diuresis with metolazone.  Check renal function and electrolytes  to evaluate for evidence of electrolyte derangement or worsening renal function.  His weight is down 8 pounds when compared to his last in person visit in our office.  He has a history of prolonged cardiomyopathy that has been presumed to be alcohol induced complicated by A-fib/flutter and noncompliance.  He has had numerous ER visits and hospitalizations.  He remains on Jardiance and torsemide.  Not on ACE inhibitor/ARB/ARNI/MRA secondary to CKD stage IV.  Not currently on beta-blocker due to recent shock.  Would look to resume this in follow-up with advanced heart failure.  Could also consider BiDil.  He is scheduled to follow-up with advanced heart failure later this month.  Previously evaluated by EP and not felt to be a candidate for ICD given medical nonadherence.  CHF education.  Persistent A-fib: Maintaining sinus rhythm following recent DCCV on amiodarone.  He has been without amiodarone for several days.  Prescription was called in last week, though he has not yet picked this up.  Recommended resuming 200 mg twice daily for 7 days then 200 mg daily thereafter.  CHA2DS2-VASc at least 4 (CHF, HTN, DM, vascular disease).  Remains on Eliquis 5 mg bid, does not meet reduced dosing criteria.  Recent CBC  stable.  Check CMP and TSH.  CKD stage IV with hyperkalemia: Check renal function and electrolytes following outpatient diuresis with metolazone in 04/2022.  Most recent labs indicate a potassium of 5.5 with hemolysis noted.  He was advised to follow-up with nephrology.  Liver cirrhosis: AST/ALT normalized on most recent check in 04/2022.  Felt to be multifactorial including alcohol use and RV failure.  Check CMP.  HTN: Continue medical therapy as outlined above.  Has not yet taken morning medications.  Aortic atherosclerosis/HLD: LDL 88 in 12/2021.  He remains on atorvastatin 80 mg.  Hypothyroidism: On Levothyroxine.  Check TSH.    Disposition: F/u with Dr. Mariah Milling or an APP in 3 months.   Medication Adjustments/Labs and Tests Ordered: Current medicines are reviewed at length with the patient today.  Concerns regarding medicines are outlined above. Medication changes, Labs and Tests ordered today are summarized above and listed in the Patient Instructions accessible in Encounters.   SignedEula Listen, PA-C 06/02/2022 12:30 PM     [facility] [place] [address] [place], [place] [address] [phone]

## 2022-06-02 ENCOUNTER — Other Ambulatory Visit
Admission: RE | Admit: 2022-06-02 | Discharge: 2022-06-02 | Disposition: A | Discharge status: Home / Self Care | Payer: Medicaid Other | Source: Ambulatory Visit | Attending: Physician Assistant | Admitting: Physician Assistant

## 2022-06-02 ENCOUNTER — Ambulatory Visit: Payer: Medicaid Other | Attending: Physician Assistant | Admitting: Physician Assistant

## 2022-06-02 ENCOUNTER — Encounter: Payer: Self-pay | Admitting: Physician Assistant

## 2022-06-02 ENCOUNTER — Other Ambulatory Visit: Payer: Self-pay | Admitting: *Deleted

## 2022-06-02 VITALS — BP 124/104 | HR 67 | Ht 71.0 in | Wt 240.0 lb

## 2022-06-02 DIAGNOSIS — I5022 Chronic systolic (congestive) heart failure: Secondary | ICD-10-CM

## 2022-06-02 DIAGNOSIS — I5042 Chronic combined systolic (congestive) and diastolic (congestive) heart failure: Secondary | ICD-10-CM | POA: Insufficient documentation

## 2022-06-02 DIAGNOSIS — N184 Chronic kidney disease, stage 4 (severe): Secondary | ICD-10-CM

## 2022-06-02 DIAGNOSIS — I4819 Other persistent atrial fibrillation: Secondary | ICD-10-CM

## 2022-06-02 DIAGNOSIS — K7460 Unspecified cirrhosis of liver: Secondary | ICD-10-CM | POA: Insufficient documentation

## 2022-06-02 DIAGNOSIS — E039 Hypothyroidism, unspecified: Secondary | ICD-10-CM | POA: Diagnosis present

## 2022-06-02 DIAGNOSIS — E875 Hyperkalemia: Secondary | ICD-10-CM | POA: Insufficient documentation

## 2022-06-02 DIAGNOSIS — I700 Atherosclerosis of aorta: Secondary | ICD-10-CM | POA: Diagnosis present

## 2022-06-02 DIAGNOSIS — I428 Other cardiomyopathies: Secondary | ICD-10-CM | POA: Diagnosis present

## 2022-06-02 DIAGNOSIS — I10 Essential (primary) hypertension: Secondary | ICD-10-CM | POA: Insufficient documentation

## 2022-06-02 DIAGNOSIS — I5023 Acute on chronic systolic (congestive) heart failure: Secondary | ICD-10-CM | POA: Diagnosis present

## 2022-06-02 LAB — COMPREHENSIVE METABOLIC PANEL
ALT: 18 U/L (ref 0–44)
AST: 25 U/L (ref 15–41)
Albumin: 4.1 g/dL (ref 3.5–5.0)
Alkaline Phosphatase: 109 U/L (ref 38–126)
Anion gap: 16 — ABNORMAL HIGH (ref 5–15)
BUN: 70 mg/dL — ABNORMAL HIGH (ref 6–20)
CO2: 32 mmol/L (ref 22–32)
Calcium: 8.9 mg/dL (ref 8.9–10.3)
Chloride: 89 mmol/L — ABNORMAL LOW (ref 98–111)
Creatinine, Ser: 4.27 mg/dL — ABNORMAL HIGH (ref 0.61–1.24)
GFR, Estimated: 15 mL/min — ABNORMAL LOW (ref >60)
Glucose, Bld: 143 mg/dL — ABNORMAL HIGH (ref 70–99)
Potassium: 3.4 mmol/L — ABNORMAL LOW (ref 3.5–5.1)
Sodium: 137 mmol/L (ref 135–145)
Total Bilirubin: 1.6 mg/dL — ABNORMAL HIGH (ref 0.3–1.2)
Total Protein: 8.7 g/dL — ABNORMAL HIGH (ref 6.5–8.1)

## 2022-06-02 LAB — TSH: TSH: 9.181 u[IU]/mL — ABNORMAL HIGH (ref 0.350–4.500)

## 2022-06-02 NOTE — Patient Instructions (Addendum)
Medication Instructions:  No changes at this time.   *If you need a refill on your cardiac medications before your next appointment, please call your pharmacy*   Lab Work: CMET & TSH today over at the [facility] mall. Stop at registration desk for check in.   If you have labs (blood work) drawn today and your tests are completely normal, you will receive your results only by: MyChart Message (if you have MyChart) OR A paper copy in the mail If you have any lab test that is abnormal or we need to change your treatment, we will call you to review the results.   Testing/Procedures: None   Follow-Up: At [facility], you and your health needs are our priority.  As part of our continuing mission to provide you with exceptional heart care, we have created designated Provider Care Teams.  These Care Teams include your primary Cardiologist (physician) and Advanced Practice Providers (APPs -  Physician Assistants and Nurse Practitioners) who all work together to provide you with the care you need, when you need it.  We recommend signing up for the patient portal called "MyChart".  Sign up information is provided on this After Visit Summary.  MyChart is used to connect with patients for Virtual Visits (Telemedicine).  Patients are able to view lab/test results, encounter notes, upcoming appointments, etc.  Non-urgent messages can be sent to your provider as well.   To learn more about what you can do with MyChart, go to [email or web address].    Your next appointment:   3 month(s)  Provider:   Julien Nordmann, MD or Eula Listen, PA-C    Other Instructions CALL [facility] to schedule appointment.  Schedule appointment with Dr. Thereasa Solo.

## 2022-06-16 ENCOUNTER — Encounter: Payer: Medicaid Other | Admitting: Internal Medicine

## 2022-06-17 ENCOUNTER — Ambulatory Visit (HOSPITAL_BASED_OUTPATIENT_CLINIC_OR_DEPARTMENT_OTHER): Payer: Medicaid Other | Admitting: Internal Medicine

## 2022-06-17 ENCOUNTER — Encounter: Payer: Self-pay | Admitting: Internal Medicine

## 2022-06-17 ENCOUNTER — Other Ambulatory Visit
Admission: RE | Admit: 2022-06-17 | Discharge: 2022-06-17 | Disposition: A | Discharge status: Home / Self Care | Payer: Medicaid Other | Source: Ambulatory Visit | Attending: Cardiology | Admitting: Cardiology

## 2022-06-17 ENCOUNTER — Other Ambulatory Visit: Payer: Self-pay

## 2022-06-17 VITALS — BP 147/109 | HR 64 | Wt 256.0 lb

## 2022-06-17 DIAGNOSIS — I5042 Chronic combined systolic (congestive) and diastolic (congestive) heart failure: Secondary | ICD-10-CM

## 2022-06-17 DIAGNOSIS — I480 Paroxysmal atrial fibrillation: Secondary | ICD-10-CM

## 2022-06-17 DIAGNOSIS — N184 Chronic kidney disease, stage 4 (severe): Secondary | ICD-10-CM | POA: Diagnosis not present

## 2022-06-17 DIAGNOSIS — Z01810 Encounter for preprocedural cardiovascular examination: Secondary | ICD-10-CM

## 2022-06-17 LAB — BRAIN NATRIURETIC PEPTIDE: B Natriuretic Peptide: 897.4 pg/mL — ABNORMAL HIGH (ref 0.0–100.0)

## 2022-06-17 LAB — BASIC METABOLIC PANEL
Anion gap: 9 (ref 5–15)
BUN: 61 mg/dL — ABNORMAL HIGH (ref 6–20)
CO2: 28 mmol/L (ref 22–32)
Calcium: 9 mg/dL (ref 8.9–10.3)
Chloride: 102 mmol/L (ref 98–111)
Creatinine, Ser: 3.63 mg/dL — ABNORMAL HIGH (ref 0.61–1.24)
GFR, Estimated: 19 mL/min — ABNORMAL LOW (ref >60)
Glucose, Bld: 107 mg/dL — ABNORMAL HIGH (ref 70–99)
Potassium: 4.8 mmol/L (ref 3.5–5.1)
Sodium: 139 mmol/L (ref 135–145)

## 2022-06-17 MED ORDER — POTASSIUM CHLORIDE CRYS ER 20 MEQ PO TBCR: EXTENDED_RELEASE_TABLET | ORAL | 3 refills | Status: DC

## 2022-06-17 MED ORDER — METOLAZONE 2.5 MG PO TABS: ORAL_TABLET | ORAL | 3 refills | Status: DC

## 2022-06-17 MED ORDER — ISOSORB DINITRATE-HYDRALAZINE 20-37.5 MG PO TABS: 0.5000 | ORAL_TABLET | Freq: Three times a day (TID) | ORAL | 3 refills | Status: DC

## 2022-06-17 NOTE — Patient Instructions (Signed)
TAKE Metolazone every Monday with of Potassium.  START Bidil 1/2 tablet three times daily.  Routine lab work today. Will notify you of abnormal results  Follow up in 3-4 weeks with Tomasita Morrow  Follow up with Dr.Bensimhon in 3 months  Do the following things EVERYDAY: Weigh yourself in the morning before breakfast. Write it down and keep it in a log. Take your medicines as prescribed Eat low salt foods--Limit salt (sodium) to 2000 mg per day.  Stay as active as you can everyday Limit all fluids for the day to less than 2 liters

## 2022-06-17 NOTE — Progress Notes (Signed)
ADVANCED HF CLINIC NOTE  Referring Physician: Julien Nordmann, MD Primary Care: Oswaldo Conroy, MD Primary Cardiologist: Julien Nordmann, MD   HPI:   Mr. Parke is a 57 yo male with ETOH use, DM2, morbid obesity, PAF (h/o GIB on apixaban),  OSA on CPAP, CKD stage IV (Scr ~3.3), medication noncompliance.    HF dates back to at least 2009, thought to presumably be nonischemic in the setting of alcohol use, with an EF of 20%.     Echo 2016 EF 20-25%. LHC 12/2014 minimal CAD   EF initially improved with restoration of NSR but then dropped back down    He underwent TEE/DCCV in 02/2021 at that time showed an EF of less than 20% with severely reduced RV systolic function.  He was reevaluated by EP in 04/2021 and felt not to be a good candidate for ICD or ablation given medication noncompliance.   Admitted in 9/23 & 10/23 with ADHF. Limited echo 10/23 EF <  20% with moderate RV dysfunction and moderate to sever MR.  He was discharged from the hospital on 12/12/2021 with a weight of 247 pounds.     Admitted 11/23, 12/23, 1/24 and 3/24 with recurrent HF.   In 3/24 underwent TEE-guided DCCV as well as RHC. TEE EF < 20%, mod RV HK, mod MR. RHC with markedly elevated filling pressures and low CO   RA 17 PA 58/39 (43) PCWP 33 Fick 3.4/1.5 (PA sat 49%) PAPi 1.2   Seen in ER 05/18/22 with recurrent volume overload. Lasix switched to torsemide and metolazone added. Seen by Eula Listen and Clarisa Kindred recently. Wasn't weighing himself or taking Jardiance  Here for f/u. Living with his brother. Says he is doing good. Not weighing himself. Taking torsemide 40 daily. But says he is out of his "booster" pill that he takes once a week. Taking other meds but doesn't know the name. Drinking a lot of soda. Able to do ADLs without too much problem. No orthopnea or PND. No CP.     Past Medical History:  Diagnosis Date   Alcohol abuse    Alcoholic cardiomyopathy    a. 16/1096 MV: EF 28%, no isch;   b. 8/12 Echo: EF 25-35%; c. 02/2014 Echo: EF 20-25%; d. 12/2014 Cath: minimal CAD; e. 01/2015 Echo: EF 50-55%;  d. 05/2016 Echo: EF 30-35%, diff HK, gr2 DD; e. 11/2016 Echo: EF 45-50%, diff HK; f. 09/2021 Echo: EF <20%; g. 11/2021 Echo: EF<20%.   Chronic combined systolic (congestive) and diastolic (congestive) heart failure    a. 05/2016 Echo: EF 30-35%; b. 11/2016 Echo: EF 45-50%, diff HK; c. 09/2021 Echo: EF < 20%; d. 11/2021 Echo: EF <20%, no rwma, Nl RV fxn, sev dil LA, mod-sev MR. mod dil PA w/ mildly elev PASP.   CKD (chronic kidney disease), stage III    Elevated troponin (chronic)    Essential hypertension    GI bleed 11/2013   Hyperlipidemia    Pancreatitis    Paroxysmal A-fib    a. new onset s/p unsuccessful TEE/DCCV on 08/16/2014; b. CHA2DS2VASc = 3-> eliquis (freq noncompliant); c. 02/2021 s/p TEE/DCCV; d. Prev on amio->d/c 2/2 abnl TFTs.   Paroxysmal atrial flutter    Sleep-disordered breathing    Has yet to have a sleep study   Stroke     Current Outpatient Medications  Medication Sig Dispense Refill   allopurinol (ZYLOPRIM) 100 MG tablet Take 0.5 tablets (50 mg total) by mouth daily. 15 tablet 3   amiodarone (  PACERONE) 200 MG tablet Take 1 tablet (200 mg total) by mouth 2 (two) times daily for 7 days, THEN 1 tablet (200 mg total) daily. 44 tablet 0   apixaban (ELIQUIS) 5 MG TABS tablet Take 1 tablet (5 mg total) by mouth 2 (two) times daily. 60 tablet 6   atorvastatin (LIPITOR) 80 MG tablet Take 1 tablet (80 mg total) by mouth daily. 90 tablet 11   empagliflozin (JARDIANCE) 10 MG TABS tablet Take 1 tablet (10 mg total) by mouth daily. 30 tablet 5   famotidine (PEPCID) 20 MG tablet Take 1 tablet (20 mg total) by mouth daily. 90 tablet 1   ferrous gluconate (FERGON) 324 MG tablet Take 324 mg by mouth daily with breakfast.     levothyroxine (SYNTHROID) 100 MCG tablet Take 1 tablet (100 mcg total) by mouth daily before breakfast. 30 tablet 11   lipase/protease/amylase (CREON) 36000  UNITS CPEP capsule Take 1 capsule (36,000 Units total) by mouth 3 (three) times daily with meals. 180 capsule 2   ondansetron (ZOFRAN) 4 MG tablet Take 1 tablet (4 mg total) by mouth every 6 (six) hours as needed for nausea. 20 tablet 0   torsemide (DEMADEX) 20 MG tablet Take 40 mg by mouth daily.     No current facility-administered medications for this visit.    Allergies  Allergen Reactions   Esomeprazole Magnesium Other (See Comments)    Suspected interstitial nephritis 2018   Egg-Derived Products Rash      Social History   Socioeconomic History   Marital status: Single    Spouse name: Not on file   Number of children: Not on file   Years of education: Not on file   Highest education level: Not on file  Occupational History   Not on file  Tobacco Use   Smoking status: Former    Packs/day: 1.00    Years: 12.00    Additional pack years: 0.00    Total pack years: 12.00    Types: Cigarettes    Quit date: 02/23/1998    Years since quitting: 24.3   Smokeless tobacco: Never  Vaping Use   Vaping Use: Never used  Substance and Sexual Activity   Alcohol use: Not Currently    Alcohol/week: 0.0 standard drinks of alcohol    Comment: Past heavy drinker   Drug use: No   Sexual activity: Not Currently  Other Topics Concern   Not on file  Social History Narrative   Not on file   Social Determinants of Health   Financial Resource Strain: Low Risk  (01/04/2019)   Overall Financial Resource Strain (CARDIA)    Difficulty of Paying Living Expenses: Not hard at all  Food Insecurity: No Food Insecurity (02/25/2022)   Hunger Vital Sign    Worried About Running Out of Food in the Last Year: Never true    Ran Out of Food in the Last Year: Never true  Transportation Needs: No Transportation Needs (02/25/2022)   PRAPARE - Administrator, Civil Service (Medical): No    Lack of Transportation (Non-Medical): No  Physical Activity: Inactive (01/04/2019)   Exercise Vital Sign     Days of Exercise per Week: 0 days    Minutes of Exercise per Session: 0 min  Stress: No Stress Concern Present (01/04/2019)   [employer] of Occupational Health - Occupational Stress Questionnaire    Feeling of Stress : Not at all  Social Connections: Moderately Isolated (01/04/2019)   Social Connection and Isolation  Panel [NHANES]    Frequency of Communication with Friends and Family: More than three times a week    Frequency of Social Gatherings with Friends and Family: More than three times a week    Attends Religious Services: 1 to 4 times per year    Active Member of [employer] or Organizations: No    Attends Banker Meetings: Never    Marital Status: Never married  Intimate Partner Violence: Not At Risk (02/25/2022)   Humiliation, Afraid, Rape, and Kick questionnaire    Fear of Current or Ex-Partner: No    Emotionally Abused: No    Physically Abused: No    Sexually Abused: No      Family History  Problem Relation Age of Onset   Hypertension Mother    Hyperlipidemia Mother    Diabetes Mother     Vitals:   06/17/22 1401  BP: (!) 147/109  Pulse: 64  SpO2: 98%  Weight: 256 lb (116.1 kg)    Wt Readings from Last 3 Encounters:  06/17/22 256 lb (116.1 kg)  06/02/22 240 lb (108.9 kg)  05/26/22 239 lb 8 oz (108.6 kg)     PHYSICAL EXAM: General:  Sitting in chair  No resp difficulty HEENT: normal Neck: supple. no JVD. Carotids 2+ bilat; no bruits. No lymphadenopathy or thryomegaly appreciated. Cor: PMI laterally displaced. Regular rate & rhythm. No rubs, gallops or murmurs. Lungs: clear Abdomen: soft, nontender, nondistended. No hepatosplenomegaly. No bruits or masses. Good bowel sounds. Extremities: no cyanosis, clubbing, rash, edema Neuro: alert & orientedx3, cranial nerves grossly intact. moves all 4 extremities w/o difficulty. Affect pleasant  ECG: NSR    ASSESSMENT & PLAN:  1. 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 LVEF<20% RV moderately down moderate to severe MR -Has had multiple hospitalizations for heart failure exacerbation likely secondary to noncompliance and end-stage CM -Patient was previously deemed not to be ICD insertion by EP due to noncompliance - Etiology of LV dysfunction remains unclear but may have a component of tachy-CM with AF. Has been seen by EP and not felt to be candidate for ablation.  - Echo 3/24 EF < 20% Moderate RV dysfunction - RHC 3/24 RA 17 PA 58/39 (43) PCWP 33 Fick 3.4/1.5 (PA sat 49%) PAPi 1.2  - Improved NYHA III now back in NSR - Continue Jardiance 10  - No ACE inhibitor/ARB/ARNI/MRA secondary to stage IV CKD - No b-blocker with low output on cath - BP very high. Will add Bidil 1/2 tab bid (will use bid and not tid for compliance sake) - Given CKD 4, tobacco use and obesity he is not candidate for advanced HF therapies - He has end-stage HF with poor insight into his illness. Not interested in Palliative Care referral - Repeat ECG in 3 months to see if EF recovering with maintenance of NSR - Refer Paramedicine - Labs today - F/u with NP 2-3 weeks. Me 2-3 months  2. Persistent atrial fibrillation/atrial flutter -C ontinued on apixaban 5 mg twice daily for CHA2DS2-VASc score of at least 6 - Last DC-CV 1/23 was successful but reverted to AF - Previously seen by EP -> not candidate for ablation - TEE/DC-CV 3/24  - Remains in NSR today. Continue amio and ELiquis   CKD stage IV -Baseline Scr 2.8-3.4 -Check labs today. - Follow labs closely with metolazone -Avoid nephrotoxic agents where able - Not HD candidate with severe HF  4. Nonobstructive coronary artery disease - Cath 2016   5. Obstructive sleep apnea - Continue CPAP   6. Type 2 diabetes -A1c 7.2 on 11/19/2021 -Continue SGLT2i -Per PCP   7. Tobacco use - said he no longer smokes      Arvilla Meres, MD  2:17 PM

## 2022-06-22 ENCOUNTER — Encounter: Payer: Medicaid Other | Admitting: Cardiology

## 2022-06-29 ENCOUNTER — Other Ambulatory Visit: Payer: Self-pay

## 2022-06-29 MED ORDER — METOLAZONE 2.5 MG PO TABS: ORAL_TABLET | ORAL | 3 refills | Status: DC

## 2022-06-29 MED ORDER — AMIODARONE HCL 200 MG PO TABS: 200.0000 mg | ORAL_TABLET | Freq: Every day | ORAL | 6 refills | Status: DC

## 2022-07-14 ENCOUNTER — Encounter: Payer: Medicaid Other | Admitting: Family

## 2022-07-17 ENCOUNTER — Ambulatory Visit: Payer: Medicaid Other | Attending: Family | Admitting: Family

## 2022-07-17 ENCOUNTER — Telehealth: Payer: Self-pay | Admitting: Physician Assistant

## 2022-07-17 ENCOUNTER — Encounter: Payer: Self-pay | Admitting: Family

## 2022-07-17 VITALS — BP 146/102 | HR 64 | Wt 256.0 lb

## 2022-07-17 DIAGNOSIS — G4733 Obstructive sleep apnea (adult) (pediatric): Secondary | ICD-10-CM | POA: Diagnosis not present

## 2022-07-17 DIAGNOSIS — I130 Hypertensive heart and chronic kidney disease with heart failure and stage 1 through stage 4 chronic kidney disease, or unspecified chronic kidney disease: Secondary | ICD-10-CM | POA: Insufficient documentation

## 2022-07-17 DIAGNOSIS — E1122 Type 2 diabetes mellitus with diabetic chronic kidney disease: Secondary | ICD-10-CM | POA: Insufficient documentation

## 2022-07-17 DIAGNOSIS — Z7901 Long term (current) use of anticoagulants: Secondary | ICD-10-CM | POA: Diagnosis not present

## 2022-07-17 DIAGNOSIS — I5042 Chronic combined systolic (congestive) and diastolic (congestive) heart failure: Secondary | ICD-10-CM | POA: Insufficient documentation

## 2022-07-17 DIAGNOSIS — I2510 Atherosclerotic heart disease of native coronary artery without angina pectoris: Secondary | ICD-10-CM | POA: Insufficient documentation

## 2022-07-17 DIAGNOSIS — N184 Chronic kidney disease, stage 4 (severe): Secondary | ICD-10-CM | POA: Insufficient documentation

## 2022-07-17 DIAGNOSIS — I4819 Other persistent atrial fibrillation: Secondary | ICD-10-CM | POA: Diagnosis not present

## 2022-07-17 DIAGNOSIS — Z79899 Other long term (current) drug therapy: Secondary | ICD-10-CM | POA: Insufficient documentation

## 2022-07-17 DIAGNOSIS — Z91148 Patient's other noncompliance with medication regimen for other reason: Secondary | ICD-10-CM | POA: Insufficient documentation

## 2022-07-17 DIAGNOSIS — I5084 End stage heart failure: Secondary | ICD-10-CM | POA: Diagnosis not present

## 2022-07-17 DIAGNOSIS — I428 Other cardiomyopathies: Secondary | ICD-10-CM | POA: Diagnosis not present

## 2022-07-17 DIAGNOSIS — I4892 Unspecified atrial flutter: Secondary | ICD-10-CM | POA: Diagnosis not present

## 2022-07-17 NOTE — Patient Instructions (Addendum)
Increase bidil to 1 whole tablet in the morning and 1 whole tablet in the evening.    Resume weighing daily and call for an overnight weight gain of 3 pounds or more or a weekly weight gain of more than 5 pounds.

## 2022-07-17 NOTE — Progress Notes (Signed)
[facility] HEART FAILURE CLINIC - Pharmacist Note  Hector [name] is a 57 y.o. male with HFrEF (EF <40%) presenting to the Heart Failure Clinic for follow up. Patient brought medications today for review. During medication reconciliation, it was discovered that he had 2 bottles of amiodarone 200 mg. He reported that he did not realize there was a second bottle and he was taking one from each bottle each day (400 mg daily). He has been taking 400 mg daily for at least 2 weeks. We combined the medication from these bottles and discarded the extra empty bottle. He expressed understanding that he is to take only one 200 mg tablet daily. We also identified that the patient had only been taking 0.5 tablets of BiDil once daily rather than twice daily as directed by Dr. Gala Romney at his last visit. Patient was asked whether he thought he would be able to remember to take this medication twice daily moving forward, and he stated that he could. He has been out of Eliquis for about 1 week and out of allopurinol and Synthroid for more than one week. He states that he will pick these up today.  Patient states that he does not weigh himself at home except when the paramedic comes for a home visit. He states that he does have a scale at home. He reports no signs or symptoms of volume overload today. He states that he drinks ~3 bottles of water and ~2 cans of soda daily.   Recent ED Visit (past 6 months):  Date: 05/18/2022, CC: SOB Date: 04/25/2022, CC: emesis, SOB Date: 03/23/2022, CC: arm pain Date: 02/23/2022, CC: abdominal pain Date: 01/23/2022, CC: abdominal pain  Guideline-Directed Medical Therapy/Evidence Based Medicine ACE/ARB/ARNI: none Beta Blocker:  none, inappropriate given low output HF Aldosterone Antagonist: none Diuretic: Torsemide 40 mg daily + metolazone 2.5 mg on Mondays  SGLT2i: Empagliflozin 10 mg daily  Adherence Assessment Do you ever forget to take your medication? [x] Yes [] No  Do you ever  skip doses due to side effects? [] Yes [x] No  Do you have trouble affording your medicines? [] Yes [x] No  Are you ever unable to pick up your medication due to transportation difficulties? [] Yes [x] No  Do you ever stop taking your medications because you don't believe they are helping? [] Yes [x] No  Do you check your weight daily? [] Yes [x] No  Adherence strategy: none reported  Barriers to obtaining medications: none reported   Diagnostics ECHO: Date 05/01/2022, EF <20%, severely decreased LV function Cath: Date 04/28/2022, severely elevated left and right heart filling pressures, low-output heart failure   Vitals    07/17/2022   12:53 PM 06/17/2022    2:01 PM 06/02/2022   11:12 AM  Vitals with BMI  Height   5\' 11"   Weight 256 lbs 256 lbs 240 lbs  BMI  35.72 33.49  Systolic 146 147 119  Diastolic 102 109 147  Pulse 64 64 67     Recent Labs    Latest Ref Rng & Units 06/17/2022    3:19 PM 06/02/2022   12:09 PM 05/18/2022    5:27 PM  BMP  Glucose 70 - 99 mg/dL 829  562  81   BUN 6 - 20 mg/dL 61  70  37   Creatinine 0.61 - 1.24 mg/dL 1.30  8.65  7.84   Sodium 135 - 145 mmol/L 139  137  136   Potassium 3.5 - 5.1 mmol/L 4.8  3.4  5.5   Chloride 98 - 111 mmol/L 102  89  100   CO2 22 - 32 mmol/L 28  32  23   Calcium 8.9 - 10.3 mg/dL 9.0  8.9  9.0     Past Medical History Past Medical History:  Diagnosis Date   Alcohol abuse    Alcoholic cardiomyopathy (HCC)    a. 12/2007 MV: EF 28%, no isch;  b. 8/12 Echo: EF 25-35%; c. 02/2014 Echo: EF 20-25%; d. 12/2014 Cath: minimal CAD; e. 01/2015 Echo: EF 50-55%;  d. 05/2016 Echo: EF 30-35%, diff HK, gr2 DD; e. 11/2016 Echo: EF 45-50%, diff HK; f. 09/2021 Echo: EF <20%; g. 11/2021 Echo: EF<20%.   Chronic combined systolic (congestive) and diastolic (congestive) heart failure (HCC)    a. 05/2016 Echo: EF 30-35%; b. 11/2016 Echo: EF 45-50%, diff HK; c. 09/2021 Echo: EF < 20%; d. 11/2021 Echo: EF <20%, no rwma, Nl RV fxn, sev dil LA, mod-sev MR. mod dil  PA w/ mildly elev PASP.   CKD (chronic kidney disease), stage III (HCC)    Elevated troponin (chronic)    Essential hypertension    GI bleed 11/2013   Hyperlipidemia    Pancreatitis    Paroxysmal A-fib (HCC)    a. new onset s/p unsuccessful TEE/DCCV on 08/16/2014; b. CHA2DS2VASc = 3-> eliquis (freq noncompliant); c. 02/2021 s/p TEE/DCCV; d. Prev on amio->d/c 2/2 abnl TFTs.   Paroxysmal atrial flutter (HCC)    Sleep-disordered breathing    Has yet to have a sleep study   Stroke [facility])     Plan Continue regimen as directed by NP GDMT largely limited by poor renal function Increase BiDil to 1 tablet twice daily Annual echo due 04/2023  Time spent: 10 minutes  Celene Squibb, PharmD PGY1 Pharmacy Resident 07/17/2022 2:30 PM

## 2022-07-17 NOTE — Telephone Encounter (Signed)
*  STAT* If patient is at the pharmacy, call can be transferred to refill team.   1. Which medications need to be refilled? (please list name of each medication and dose if known) Allopurinol 100mg   2. Which pharmacy/location (including street and city if local pharmacy) is medication to be sent to?[facility] [address]  3. Do they need a 30 day or 90 day supply? 90 day

## 2022-07-17 NOTE — Progress Notes (Signed)
ADVANCED HF CLINIC NOTE   Primary Care: Oswaldo Conroy, MD Primary Cardiologist: Julien Nordmann, MD (last seen 04/24) HP provider: Arvilla Meres, MD (last seen 04/24)   HPI:   Hector [name] is a 57 yo male with ETOH use, DM2, morbid obesity, PAF (h/o GIB on apixaban),  OSA on CPAP, CKD stage IV (Scr ~3.3), medication noncompliance.    HF dates back to at least 2009, thought to presumably be nonischemic in the setting of alcohol use, with an EF of 20%.     Echo 2016 EF 20-25%. LHC 12/2014 minimal CAD   EF initially improved with restoration of NSR but then dropped back down    He underwent TEE/DCCV in 02/2021 at that time showed an EF of less than 20% with severely reduced RV systolic function.  He was reevaluated by EP in 04/2021 and felt not to be a good candidate for ICD or ablation given medication noncompliance.   Admitted in 9/23 & 10/23 with ADHF. Limited echo 10/23 EF <  20% with moderate RV dysfunction and moderate to sever MR.  He was discharged from the hospital on 12/12/2021 with a weight of 247 pounds.     Admitted 11/23, 12/23, 1/24 and 3/24 with recurrent HF.   In 3/24 underwent TEE-guided DCCV as well as RHC. TEE EF < 20%, mod RV HK, mod MR. RHC with markedly elevated filling pressures and low CO   RA 17 PA 58/39 (43) PCWP 33 Fick 3.4/1.5 (PA sat 49%) PAPi 1.2   Seen in ER 05/18/22 with recurrent volume overload. Lasix switched to torsemide and metolazone added.  He presents today for a HF f/u visit with a chief complaint of intermittent abdominal distention. Describes this as chronic in nature. Reports sleeping well and denies any SOB, chest pain, palpitations, dizziness or pedal edema. Has scales at home but is not weighing himself at all.   At last visit, 1/2 tablet bidil BID was added but he was confused so has only been taking it once daily.     Past Medical History:  Diagnosis Date   Alcohol abuse    Alcoholic cardiomyopathy (HCC)    a.  12/2007 MV: EF 28%, no isch;  b. 8/12 Echo: EF 25-35%; c. 02/2014 Echo: EF 20-25%; d. 12/2014 Cath: minimal CAD; e. 01/2015 Echo: EF 50-55%;  d. 05/2016 Echo: EF 30-35%, diff HK, gr2 DD; e. 11/2016 Echo: EF 45-50%, diff HK; f. 09/2021 Echo: EF <20%; g. 11/2021 Echo: EF<20%.   Chronic combined systolic (congestive) and diastolic (congestive) heart failure (HCC)    a. 05/2016 Echo: EF 30-35%; b. 11/2016 Echo: EF 45-50%, diff HK; c. 09/2021 Echo: EF < 20%; d. 11/2021 Echo: EF <20%, no rwma, Nl RV fxn, sev dil LA, mod-sev MR. mod dil PA w/ mildly elev PASP.   CKD (chronic kidney disease), stage III (HCC)    Elevated troponin (chronic)    Essential hypertension    GI bleed 11/2013   Hyperlipidemia    Pancreatitis    Paroxysmal A-fib (HCC)    a. new onset s/p unsuccessful TEE/DCCV on 08/16/2014; b. CHA2DS2VASc = 3-> eliquis (freq noncompliant); c. 02/2021 s/p TEE/DCCV; d. Prev on amio->d/c 2/2 abnl TFTs.   Paroxysmal atrial flutter (HCC)    Sleep-disordered breathing    Has yet to have a sleep study   Stroke [facility])     Current Outpatient Medications  Medication Sig Dispense Refill   allopurinol (ZYLOPRIM) 100 MG tablet Take 0.5 tablets (50 mg total) by  mouth daily. 15 tablet 3   amiodarone (PACERONE) 200 MG tablet Take 1 tablet (200 mg total) by mouth daily. 30 tablet 6   apixaban (ELIQUIS) 5 MG TABS tablet Take 1 tablet (5 mg total) by mouth 2 (two) times daily. 60 tablet 6   atorvastatin (LIPITOR) 80 MG tablet Take 1 tablet (80 mg total) by mouth daily. 90 tablet 11   empagliflozin (JARDIANCE) 10 MG TABS tablet Take 1 tablet (10 mg total) by mouth daily. 30 tablet 5   famotidine (PEPCID) 20 MG tablet Take 1 tablet (20 mg total) by mouth daily. 90 tablet 1   ferrous gluconate (FERGON) 324 MG tablet Take 324 mg by mouth daily with breakfast.     isosorbide-hydrALAZINE (BIDIL) 20-37.5 MG tablet Take 0.5 tablets by mouth 3 (three) times daily. 45 tablet 3   levothyroxine (SYNTHROID) 100 MCG tablet Take 1  tablet (100 mcg total) by mouth daily before breakfast. 30 tablet 11   lipase/protease/amylase (CREON) 36000 UNITS CPEP capsule Take 1 capsule (36,000 Units total) by mouth 3 (three) times daily with meals. 180 capsule 2   metolazone (ZAROXOLYN) 2.5 MG tablet Take 1 tablet every Monday with of Potassium. 10 tablet 3   ondansetron (ZOFRAN) 4 MG tablet Take 1 tablet (4 mg total) by mouth every 6 (six) hours as needed for nausea. 20 tablet 0   potassium chloride SA (KLOR-CON M) 20 MEQ tablet Take with Metolazone. 10 tablet 3   torsemide (DEMADEX) 20 MG tablet Take 40 mg by mouth daily.     No current facility-administered medications for this visit.    Allergies  Allergen Reactions   Esomeprazole Magnesium Other (See Comments)    Suspected interstitial nephritis 2018   Egg-Derived Products Rash      Social History   Socioeconomic History   Marital status: Single    Spouse name: Not on file   Number of children: Not on file   Years of education: Not on file   Highest education level: Not on file  Occupational History   Not on file  Tobacco Use   Smoking status: Former    Packs/day: 1.00    Years: 12.00    Additional pack years: 0.00    Total pack years: 12.00    Types: Cigarettes    Quit date: 02/23/1998    Years since quitting: 24.4   Smokeless tobacco: Never  Vaping Use   Vaping Use: Never used  Substance and Sexual Activity   Alcohol use: Not Currently    Alcohol/week: 0.0 standard drinks of alcohol    Comment: Past heavy drinker   Drug use: No   Sexual activity: Not Currently  Other Topics Concern   Not on file  Social History Narrative   Not on file   Social Determinants of Health   Financial Resource Strain: Low Risk  (01/04/2019)   Overall Financial Resource Strain (CARDIA)    Difficulty of Paying Living Expenses: Not hard at all  Food Insecurity: No Food Insecurity (02/25/2022)   Hunger Vital Sign    Worried About Running Out of Food in the Last  Year: Never true    Ran Out of Food in the Last Year: Never true  Transportation Needs: No Transportation Needs (02/25/2022)   PRAPARE - Administrator, Civil [name] (Medical): No    Lack of Transportation (Non-Medical): No  Physical Activity: Inactive (01/04/2019)   Exercise Vital Sign    Days of Exercise per Week: 0 days  Minutes of Exercise per Session: 0 min  Stress: No Stress Concern Present (01/04/2019)   [employer] of Occupational Health - Occupational Stress Questionnaire    Feeling of Stress : Not at all  Social Connections: Moderately Isolated (01/04/2019)   Social Connection and Isolation Panel [NHANES]    Frequency of Communication with Friends and Family: More than three times a week    Frequency of Social Gatherings with Friends and Family: More than three times a week    Attends Religious Services: 1 to 4 times per year    Active Member of [employer] or Organizations: No    Attends Banker Meetings: Never    Marital Status: Never married  Intimate Partner Violence: Not At Risk (02/25/2022)   Humiliation, Afraid, Rape, and Kick questionnaire    Fear of Current or Ex-Partner: No    Emotionally Abused: No    Physically Abused: No    Sexually Abused: No      Family History  Problem Relation Age of Onset   Hypertension Mother    Hyperlipidemia Mother    Diabetes Mother    Vitals:   07/17/22 1253  BP: (!) 146/102  Pulse: 64  SpO2: 95%  Weight: 256 lb (116.1 kg)   Wt Readings from Last 3 Encounters:  07/17/22 256 lb (116.1 kg)  06/17/22 256 lb (116.1 kg)  06/02/22 240 lb (108.9 kg)   Lab Results  Component Value Date   CREATININE 3.63 (H) 06/17/2022   CREATININE 4.27 (H) 06/02/2022   CREATININE 2.94 (H) 05/18/2022   PHYSICAL EXAM: General:  Sitting in chair  No resp difficulty HEENT: normal Neck: supple. no JVD. No lymphadenopathy or thryomegaly appreciated. Cor: PMI laterally displaced. Regular rate & rhythm. No rubs, gallops  or murmurs. Lungs: clear Abdomen: soft, nontender, nondistended. No hepatosplenomegaly. No bruits or masses.  Extremities: no cyanosis, clubbing, rash, edema Neuro: alert & orientedx 3, cranial nerves grossly intact. moves all 4 extremities w/o difficulty. Affect pleasant  ECG: 06/17/22 was NSR    ASSESSMENT & PLAN:  1. 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 Has been seen by EP and not felt to be candidate for ablation.  - NYHA class I - euvolemic - has scales, not weighing and implies that he won't start; encouraged him to weigh every morning and call for overnight weight gain of > 2 pounds or a weekly weight gain of > 5 pounds - weight unchanged from last visit here 1 month ago - LHC 2016 minimal CAD - Limited echocardiogram 10/23. LVEF<20% RV moderately down moderate to severe MR - Has had multiple hospitalizations for heart failure exacerbation likely secondary to noncompliance and end-stage CM  -Patient was previously deemed not to be ICD insertion by EP due to noncompliance - Echo 3/24 EF < 20% Moderate RV dysfunction - RHC 3/24 RA 17 PA 58/39 (43) PCWP 33 Fick 3.4/1.5 (PA sat 49%) PAPi 1.2  - continue Jardiance 10mg  daily - BP 146/102 - continue Bidil but will increase to 1 tablet BID; reviewed the importance of taking this BID; goal would be to get him to 1-2 tabs TID (compliance is a concern although he does take creon TID now) - No ACE inhibitor/ARB/ARNI/MRA secondary to stage IV CKD - No b-blocker with low output on cath - Given CKD 4, tobacco use and obesity he is not candidate for advanced HF therapies - He  has end-stage HF with poor insight into his illness. Not interested in Palliative Care referral - Repeat ECG in 2 months to see if EF recovering with maintenance of NSR - saw HF provider (Bensimhon) 04/24; returns 07/24 -  participating in paramedicine program - BMP next visit - BNP 06/17/22 was 897.4  2. Persistent atrial fibrillation/atrial flutter- - continue apixaban 5 mg twice daily for CHA2DS2-VASc score of at least 6 - continue amio 200mg  daily - Last DC-CV 1/23 was successful but reverted to AF - Previously seen by EP -> not candidate for ablation - TEE/DC-CV 3/24    CKD stage IV- - Baseline Scr 2.8-3.4 - BMP 06/17/22 showed sodium 139, potassium 4.8, creatinine 3.63 & GFR 19 - Follow labs closely with metolazone - Avoid nephrotoxic agents where able - Not HD candidate with severe HF - saw nephrology (Menefee) 04/24   4. Nonobstructive coronary artery disease- - Cath 2016 - saw cardiology (Dunn) 04/24   5. Obstructive sleep apnea- - Continue CPAP   6. Type 2 diabetes- -A1c 6.8% on 01/07/2022 -Continue SGLT2i -Per PCP   Return in 1 month, sooner if needed.     Delma Freeze, FNP  12:52 PM

## 2022-07-17 NOTE — Telephone Encounter (Signed)
Spoke with patient and advised patient that he needs to get this medication refill from his PCP as it is for treatment of gout. Patient expressed verbal understanding and gratitude for the call.

## 2022-08-21 ENCOUNTER — Encounter: Payer: Medicaid Other | Admitting: Family

## 2022-08-21 NOTE — Progress Notes (Deleted)
ADVANCED HF CLINIC NOTE   Primary Care: Oswaldo Conroy, MD Primary Cardiologist: Julien Nordmann, MD (last seen 04/24) HP provider: Arvilla Meres, MD (last seen 04/24)   HPI:   Hector [name] is a 57 yo male with ETOH use, DM2, morbid obesity, PAF (h/o GIB on apixaban),  OSA on CPAP, CKD stage IV (Scr ~3.3), medication noncompliance.    HF dates back to at least 2009, thought to presumably be nonischemic in the setting of alcohol use, with an EF of 20%.     Echo 2016 EF 20-25%. LHC 12/2014 minimal CAD   EF initially improved with restoration of NSR but then dropped back down    He underwent TEE/DCCV in 02/2021 at that time showed an EF of less than 20% with severely reduced RV systolic function.  He was reevaluated by EP in 04/2021 and felt not to be a good candidate for ICD or ablation given medication noncompliance.   Admitted in 9/23 & 10/23 with ADHF. Limited echo 10/23 EF <  20% with moderate RV dysfunction and moderate to sever MR.  He was discharged from the hospital on 12/12/2021 with a weight of 247 pounds.     Admitted 11/23, 12/23, 1/24 and 3/24 with recurrent HF.   In 3/24 underwent TEE-guided DCCV as well as RHC. TEE EF < 20%, mod RV HK, mod MR. RHC with markedly elevated filling pressures and low CO   RA 17 PA 58/39 (43) PCWP 33 Fick 3.4/1.5 (PA sat 49%) PAPi 1.2   Seen in ER 05/18/22 with recurrent volume overload. Lasix switched to torsemide and metolazone added.  He presents today for a HF f/u visit with a chief complaint of intermittent abdominal distention. Describes this as chronic in nature. Reports sleeping well and denies any SOB, chest pain, palpitations, dizziness or pedal edema. Has scales at home but is not weighing himself at all.   At last visit, 1/2 tablet bidil BID was added but he was confused so has only been taking it once daily.     Past Medical History:  Diagnosis Date   Alcohol abuse    Alcoholic cardiomyopathy (HCC)    a.  12/2007 MV: EF 28%, no isch;  b. 8/12 Echo: EF 25-35%; c. 02/2014 Echo: EF 20-25%; d. 12/2014 Cath: minimal CAD; e. 01/2015 Echo: EF 50-55%;  d. 05/2016 Echo: EF 30-35%, diff HK, gr2 DD; e. 11/2016 Echo: EF 45-50%, diff HK; f. 09/2021 Echo: EF <20%; g. 11/2021 Echo: EF<20%.   Chronic combined systolic (congestive) and diastolic (congestive) heart failure (HCC)    a. 05/2016 Echo: EF 30-35%; b. 11/2016 Echo: EF 45-50%, diff HK; c. 09/2021 Echo: EF < 20%; d. 11/2021 Echo: EF <20%, no rwma, Nl RV fxn, sev dil LA, mod-sev MR. mod dil PA w/ mildly elev PASP.   CKD (chronic kidney disease), stage III (HCC)    Elevated troponin (chronic)    Essential hypertension    GI bleed 11/2013   Hyperlipidemia    Pancreatitis    Paroxysmal A-fib (HCC)    a. new onset s/p unsuccessful TEE/DCCV on 08/16/2014; b. CHA2DS2VASc = 3-> eliquis (freq noncompliant); c. 02/2021 s/p TEE/DCCV; d. Prev on amio->d/c 2/2 abnl TFTs.   Paroxysmal atrial flutter (HCC)    Sleep-disordered breathing    Has yet to have a sleep study   Stroke [facility])     Current Outpatient Medications  Medication Sig Dispense Refill   allopurinol (ZYLOPRIM) 100 MG tablet Take 0.5 tablets (50 mg total) by  mouth daily. (Patient not taking: Reported on 07/17/2022) 15 tablet 3   amiodarone (PACERONE) 200 MG tablet Take 1 tablet (200 mg total) by mouth daily. 30 tablet 6   apixaban (ELIQUIS) 5 MG TABS tablet Take 1 tablet (5 mg total) by mouth 2 (two) times daily. 60 tablet 6   atorvastatin (LIPITOR) 80 MG tablet Take 1 tablet (80 mg total) by mouth daily. 90 tablet 11   empagliflozin (JARDIANCE) 10 MG TABS tablet Take 1 tablet (10 mg total) by mouth daily. 30 tablet 5   famotidine (PEPCID) 20 MG tablet Take 1 tablet (20 mg total) by mouth daily. 90 tablet 1   ferrous sulfate 325 (65 FE) MG EC tablet Take 325 mg by mouth daily with breakfast.     isosorbide-hydrALAZINE (BIDIL) 20-37.5 MG tablet Take 0.5 tablets by mouth 3 (three) times daily. (Patient taking  differently: Take 0.5 tablets by mouth daily.) 45 tablet 3   levothyroxine (SYNTHROID) 100 MCG tablet Take 1 tablet (100 mcg total) by mouth daily before breakfast. (Patient not taking: Reported on 07/17/2022) 30 tablet 11   lipase/protease/amylase (CREON) 36000 UNITS CPEP capsule Take 1 capsule (36,000 Units total) by mouth 3 (three) times daily with meals. 180 capsule 2   metolazone (ZAROXOLYN) 2.5 MG tablet Take 1 tablet every Monday with of Potassium. 10 tablet 3   potassium chloride SA (KLOR-CON M) 20 MEQ tablet Take with Metolazone. 10 tablet 3   torsemide (DEMADEX) 20 MG tablet Take 40 mg by mouth daily.     No current facility-administered medications for this visit.    Allergies  Allergen Reactions   Esomeprazole Magnesium Other (See Comments)    Suspected interstitial nephritis 2018   Egg-Derived Products Rash      Social History   Socioeconomic History   Marital status: Single    Spouse name: Not on file   Number of children: Not on file   Years of education: Not on file   Highest education level: Not on file  Occupational History   Not on file  Tobacco Use   Smoking status: Former    Packs/day: 1.00    Years: 12.00    Additional pack years: 0.00    Total pack years: 12.00    Types: Cigarettes    Quit date: 02/23/1998    Years since quitting: 24.5   Smokeless tobacco: Never  Vaping Use   Vaping Use: Never used  Substance and Sexual Activity   Alcohol use: Not Currently    Alcohol/week: 0.0 standard drinks of alcohol    Comment: Past heavy drinker   Drug use: No   Sexual activity: Not Currently  Other Topics Concern   Not on file  Social History Narrative   Not on file   Social Determinants of Health   Financial Resource Strain: Low Risk  (01/04/2019)   Overall Financial Resource Strain (CARDIA)    Difficulty of Paying Living Expenses: Not hard at all  Food Insecurity: No Food Insecurity (02/25/2022)   Hunger Vital Sign    Worried About  Running Out of Food in the Last Year: Never true    Ran Out of Food in the Last Year: Never true  Transportation Needs: No Transportation Needs (02/25/2022)   PRAPARE - Administrator, Civil [name] (Medical): No    Lack of Transportation (Non-Medical): No  Physical Activity: Inactive (01/04/2019)   Exercise Vital Sign    Days of Exercise per Week: 0 days  Minutes of Exercise per Session: 0 min  Stress: No Stress Concern Present (01/04/2019)   [employer] of Occupational Health - Occupational Stress Questionnaire    Feeling of Stress : Not at all  Social Connections: Moderately Isolated (01/04/2019)   Social Connection and Isolation Panel [NHANES]    Frequency of Communication with Friends and Family: More than three times a week    Frequency of Social Gatherings with Friends and Family: More than three times a week    Attends Religious Services: 1 to 4 times per year    Active Member of [employer] or Organizations: No    Attends Banker Meetings: Never    Marital Status: Never married  Intimate Partner Violence: Not At Risk (02/25/2022)   Humiliation, Afraid, Rape, and Kick questionnaire    Fear of Current or Ex-Partner: No    Emotionally Abused: No    Physically Abused: No    Sexually Abused: No      Family History  Problem Relation Age of Onset   Hypertension Mother    Hyperlipidemia Mother    Diabetes Mother    There were no vitals filed for this visit.  Wt Readings from Last 3 Encounters:  07/17/22 256 lb (116.1 kg)  06/17/22 256 lb (116.1 kg)  06/02/22 240 lb (108.9 kg)   Lab Results  Component Value Date   CREATININE 3.63 (H) 06/17/2022   CREATININE 4.27 (H) 06/02/2022   CREATININE 2.94 (H) 05/18/2022   PHYSICAL EXAM: General:  Sitting in chair  No resp difficulty HEENT: normal Neck: supple. no JVD. No lymphadenopathy or thryomegaly appreciated. Cor: PMI laterally displaced. Regular rate & rhythm. No rubs, gallops or murmurs. Lungs:  clear Abdomen: soft, nontender, nondistended. No hepatosplenomegaly. No bruits or masses.  Extremities: no cyanosis, clubbing, rash, edema Neuro: alert & orientedx 3, cranial nerves grossly intact. moves all 4 extremities w/o difficulty. Affect pleasant  ECG: 06/17/22 was NSR    ASSESSMENT & PLAN:  1. 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 Has been seen by EP and not felt to be candidate for ablation.  - NYHA class I - euvolemic - has scales, not weighing and implies that he won't start; encouraged him to weigh every morning and call for overnight weight gain of > 2 pounds or a weekly weight gain of > 5 pounds - weight unchanged from last visit here 1 month ago - LHC 2016 minimal CAD - Limited echocardiogram 10/23. LVEF<20% RV moderately down moderate to severe MR - Has had multiple hospitalizations for heart failure exacerbation likely secondary to noncompliance and end-stage CM  -Patient was previously deemed not to be ICD insertion by EP due to noncompliance - Echo 3/24 EF < 20% Moderate RV dysfunction - RHC 3/24 RA 17 PA 58/39 (43) PCWP 33 Fick 3.4/1.5 (PA sat 49%) PAPi 1.2  - continue Jardiance 10mg  daily - BP 146/102 - continue Bidil but will increase to 1 tablet BID; reviewed the importance of taking this BID; goal would be to get him to 1-2 tabs TID (compliance is a concern although he does take creon TID now) - No ACE inhibitor/ARB/ARNI/MRA secondary to stage IV CKD - No b-blocker with low output on cath - Given CKD 4, tobacco use and obesity he is not candidate for advanced HF therapies - He has end-stage HF with poor insight into his illness. Not interested in Palliative Care  referral - Repeat ECG in 2 months to see if EF recovering with maintenance of NSR - saw HF provider (Bensimhon) 04/24; returns 07/24 - participating in  paramedicine program - BMP next visit - BNP 06/17/22 was 897.4  2. Persistent atrial fibrillation/atrial flutter- - continue apixaban 5 mg twice daily for CHA2DS2-VASc score of at least 6 - continue amio 200mg  daily - Last DC-CV 1/23 was successful but reverted to AF - Previously seen by EP -> not candidate for ablation - TEE/DC-CV 3/24    CKD stage IV- - Baseline Scr 2.8-3.4 - BMP 06/17/22 showed sodium 139, potassium 4.8, creatinine 3.63 & GFR 19 - Follow labs closely with metolazone - Avoid nephrotoxic agents where able - Not HD candidate with severe HF - saw nephrology (Menefee) 04/24   4. Nonobstructive coronary artery disease- - Cath 2016 - saw cardiology (Dunn) 04/24   5. Obstructive sleep apnea- - Continue CPAP   6. Type 2 diabetes- -A1c 6.8% on 01/07/2022 -Continue SGLT2i -Per PCP   Return in 1 month, sooner if needed.     Delma Freeze, FNP  8:08 AM

## 2022-09-08 NOTE — Progress Notes (Deleted)
Cardiology Office Note    Date:  09/08/2022   ID:  Hector [name], DOB 23-Apr-1965, MRN [id number]  PCP:  Oswaldo Conroy, MD  Cardiologist:  Julien Nordmann, MD  Electrophysiologist:  None  Advanced Heart Failure: [place]  Chief Complaint: Follow up  History of Present Illness:   Hector [name] is a 57 y.o. 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   His heart failure history dates back to at least 2009, and was felt to be secondary to alcohol use and atrial arrhythmias with A-fib/flutter with RVR.  Echo was 20% at that time.  In the setting of noncompliance with anticoagulation and follow-up, he was rate controlled for a prolonged period of time.  He subsequently underwent multiple noninvasive ischemic evaluations, which were low risk.  He ultimately underwent diagnostic LHC in 2016 showed minimal, nonobstructive CAD.  DCCV was carried out in 01/2015 with restoration of sinus rhythm leading to an improvement in his EF.  He was later placed on amiodarone, but this was subsequently discontinued secondary to abnormal thyroid function testing.  He has since been on levothyroxine.  More recently, in 02/2021, he was readmitted with atrial flutter/fibrillation and heart failure.  TEE guided DCCV was carried out with restoration of sinus rhythm.  EF is less than 20% with severely reduced RV function at that time.  He was seen by electrophysiology in 04/2021 and reported noncompliance with medications.  In that setting, he was not felt to be a suitable candidate for catheter ablation or ICD therapy.  In 08/2021, he was admitted with complaints of abdominal  discomfort and distention.  He underwent extensive GI workup including MRCP and HIDA scan with recommendation for conservative therapy.  He was discharged home off diuretic therapy due to worsening renal function, though required readmission in 09/2021 secondary to volume overload and noncompliance.  Upon admission, he was in sinus rhythm with PACs.  However, he subsequently developed A-fib with RVR during the admission.  Echo during the admission showed an EF of less than 20%.  Notes indicate he has subsequently skipped his medications and has not been weighing himself at home.  In this setting, he was readmitted with recurrent heart failure in late 09/2021, late 10/2021, mid to 11/2021, mid 12/2021, 01/2022, 02/2022, and most recently in 04/2022 with diuresis guided at the assistance of nephrology in the setting of worsening renal failure.  He has now also been followed by the advanced heart failure service locally.   He was most recently admitted to the hospital in 04/2022 with recurrent volume overload and A-fib.  During the admission RHC showed severely elevated left and right heart filling pressures with low cardiac output/index.  Diuresis was augmented with milrinone.  He underwent successful TEE guided DCCV with TEE demonstrating an EF of less than 20% with mildly dilated LV internal cavity size, moderately reduced RV systolic function with normal ventricular cavity size, no evidence of thrombus in the left atrial appendage (smoke noted), mildly dilated right atrium, moderate mitral regurgitation, and no PFO or ASD.  EGD, performed for epigastric discomfort, during admission showed gastritis without varices.  Advanced heart failure recommended the patient be discharged on torsemide 60  mg daily, Farxiga 10 mg daily (replacing Invokana), apixaban 5 mg twice daily, and amiodarone 400 g daily.  They recommended discontinuing carvedilol and spironolactone with follow-up with heart failure.   He was evaluated in the  ED on 05/18/2022 with worsening edema and shortness of breath.  He reported having missed his heart failure visit.  He reported his medications were changed and was not sure what he was taking or what was changed.  He reported he was taking 40 of Lasix twice daily.  Troponin was mildly elevated at 53, consistent with prior readings.  Chest x-ray showed enlarged cardiac silhouette with vascular congestion.  He was discharged from the ED with 2.5 mg of metolazone daily for 10 days.   He followed up with the [facility] CHF clinic on 05/26/2022 and reported he was not weighing himself daily with no indicating "because I am lazy."  Note indicated the patient was euvolemic.  Labs following daily metolazone administration were deferred to general cardiology visit.  Patient subsequently contacted our office on 05/26/2022 requesting a refill of amiodarone.  Upon chart review, the patient was given just 15 tabs of amiodarone upon discharge on 3/11.  He was seen by general cardiology on 06/02/2022 and was without symptoms of angina, dyspnea, or cardiac decompensation. He was not weighing himself at home. His weight was down 8 pounds when compared to his visit in our office in 01/2022. He reported taking his medications, though was unclear what he was taking.   He was most recently seen by advanced heart failure on 06/17/2022 and reported taking torsemide 30 mg daily along with other medications, though was unclear what he was taking. He reported being out of "his booster pill." He was not weighing himself. BP was elevated in the 140s/100s. He was started on BiDil 1/2 tab bid and referred to paramedicine. He followed by with Hector [name] on 07/17/2022 and was taking BiDil only once daily. BP remained elevated in the 140s/100s with recommendation to increase BiDil to 1 tab bid.   ***   Labs independently reviewed: 08/2022 - Hgb 14.4, PLT 205, potassium 4.0, BUN 28, SCr 3.41 05/2022 - albumin 4.1, AST/ALT normal, TSH  9.181 04/2022 - Hgb 13.0, PLT 252, magnesium 2.7 12/2021 - A1c 6.8, TC 141, TG 140, HDL 25, LDL 88   Past Medical History:  Diagnosis Date   Alcohol abuse    Alcoholic cardiomyopathy (HCC)    a. 12/2007 MV: EF 28%, no isch;  b. 8/12 Echo: EF 25-35%; c. 02/2014 Echo: EF 20-25%; d. 12/2014 Cath: minimal CAD; e. 01/2015 Echo: EF 50-55%;  d. 05/2016 Echo: EF 30-35%, diff HK, gr2 DD; e. 11/2016 Echo: EF 45-50%, diff HK; f. 09/2021 Echo: EF <20%; g. 11/2021 Echo: EF<20%.   Chronic combined systolic (congestive) and diastolic (congestive) heart failure (HCC)    a. 05/2016 Echo: EF 30-35%; b. 11/2016 Echo: EF 45-50%, diff HK; c. 09/2021 Echo: EF < 20%; d. 11/2021 Echo: EF <20%, no rwma, Nl RV fxn, sev dil LA, mod-sev MR. mod dil PA w/ mildly elev PASP.   CKD (chronic kidney disease), stage III (HCC)    Elevated troponin (chronic)    Essential hypertension    GI bleed 11/2013   Hyperlipidemia    Pancreatitis    Paroxysmal A-fib (HCC)    a. new onset s/p unsuccessful TEE/DCCV on 08/16/2014; b. CHA2DS2VASc = 3-> eliquis (freq noncompliant); c. 02/2021 s/p TEE/DCCV; d. Prev on amio->d/c 2/2 abnl TFTs.   Paroxysmal atrial flutter (HCC)  Sleep-disordered breathing    Has yet to have a sleep study   Stroke [facility])     Past Surgical History:  Procedure Laterality Date   CARDIAC CATHETERIZATION N/A 01/09/2015   Procedure: Left Heart Cath and Coronary Angiography;  Surgeon: Marykay Lex, MD;  Location: [facility] INVASIVE CV LAB;  Service: Cardiovascular;  Laterality: N/A;   CARDIOVERSION N/A 03/05/2021   Procedure: CARDIOVERSION;  Surgeon: Debbe Odea, MD;  Location: [facility] ORS;  Service: Cardiovascular;  Laterality: N/A;   CENTRAL LINE INSERTION  04/28/2022   Procedure: CENTRAL LINE INSERTION;  Surgeon: Yvonne Kendall, MD;  Location: [facility] INVASIVE CV LAB;  Service: Cardiovascular;;   COLONOSCOPY N/A 07/22/2020   Procedure: COLONOSCOPY;  Surgeon: Toledo, Boykin Nearing, MD;  Location: [facility] ENDOSCOPY;  Service:  Gastroenterology;  Laterality: N/A;   ELECTROPHYSIOLOGIC STUDY N/A 08/16/2014   Procedure: CARDIOVERSION;  Surgeon: Antonieta Iba, MD;  Location: [facility] ORS;  Service: Cardiovascular;  Laterality: N/A;   ESOPHAGOGASTRODUODENOSCOPY (EGD) WITH PROPOFOL N/A 05/04/2022   Procedure: ESOPHAGOGASTRODUODENOSCOPY (EGD) WITH PROPOFOL;  Surgeon: Toney Reil, MD;  Location: [facility] ENDOSCOPY;  Service: Gastroenterology;  Laterality: N/A;   FLEXIBLE SIGMOIDOSCOPY N/A 10/10/2015   Procedure: FLEXIBLE SIGMOIDOSCOPY;  Surgeon: Christena Deem, MD;  Location: [facility] ENDOSCOPY;  Service: Endoscopy;  Laterality: N/A;   KNEE SURGERY Right    NM MYOVIEW LTD  November 2011   No ischemia or infarction. EF 50-55% ( no improvement from 2009 Myoview EF of 28%   RIGHT HEART CATH N/A 04/28/2022   Procedure: RIGHT HEART CATH;  Surgeon: Yvonne Kendall, MD;  Location: [facility] INVASIVE CV LAB;  Service: Cardiovascular;  Laterality: N/A;   TEE WITHOUT CARDIOVERSION N/A 08/16/2014   Procedure: TRANSESOPHAGEAL ECHOCARDIOGRAM (TEE);  Surgeon: Antonieta Iba, MD;  Location: [facility] ORS;  Service: Cardiovascular;  Laterality: N/A;   TEE WITHOUT CARDIOVERSION N/A 03/05/2021   Procedure: TRANSESOPHAGEAL ECHOCARDIOGRAM (TEE);  Surgeon: Debbe Odea, MD;  Location: [facility] ORS;  Service: Cardiovascular;  Laterality: N/A;   TEE WITHOUT CARDIOVERSION N/A 05/01/2022   Procedure: TRANSESOPHAGEAL ECHOCARDIOGRAM;  Surgeon: Laurey Morale, MD;  Location: [facility] ORS;  Service: Cardiovascular;  Laterality: N/A;  TEE with cardioversion    Current Medications: No outpatient medications have been marked as taking for the 09/11/22 encounter (Appointment) with Sondra Barges, PA-C.    Allergies:   Esomeprazole magnesium and Egg-derived products   Social History   Socioeconomic History   Marital status: Single    Spouse name: Not on file   Number of children: Not on file   Years of education: Not on file   Highest education level: Not on file   Occupational History   Not on file  Tobacco Use   Smoking status: Former    Current packs/day: 0.00    Average packs/day: 1 pack/day for 12.0 years (12.0 ttl pk-yrs)    Types: Cigarettes    Start date: 02/23/1986    Quit date: 02/23/1998    Years since quitting: 24.5   Smokeless tobacco: Never  Vaping Use   Vaping status: Never Used  Substance and Sexual Activity   Alcohol use: Not Currently    Alcohol/week: 0.0 standard drinks of alcohol    Comment: Past heavy drinker   Drug use: No   Sexual activity: Not Currently  Other Topics Concern   Not on file  Social History Narrative   Not on file   Social Determinants of Health   Financial Resource Strain: Low Risk  (01/04/2019)   Overall Financial Resource  Strain (CARDIA)    Difficulty of Paying Living Expenses: Not hard at all  Food Insecurity: No Food Insecurity (02/25/2022)   Hunger Vital Sign    Worried About Running Out of Food in the Last Year: Never true    Ran Out of Food in the Last Year: Never true  Transportation Needs: No Transportation Needs (02/25/2022)   PRAPARE - Administrator, Civil Service (Medical): No    Lack of Transportation (Non-Medical): No  Physical Activity: Inactive (01/04/2019)   Exercise Vital Sign    Days of Exercise per Week: 0 days    Minutes of Exercise per Session: 0 min  Stress: No Stress Concern Present (01/04/2019)   [employer] of Occupational Health - Occupational Stress Questionnaire    Feeling of Stress : Not at all  Social Connections: Moderately Isolated (01/04/2019)   Social Connection and Isolation Panel [NHANES]    Frequency of Communication with Friends and Family: More than three times a week    Frequency of Social Gatherings with Friends and Family: More than three times a week    Attends Religious Services: 1 to 4 times per year    Active Member of [employer] or Organizations: No    Attends Banker Meetings: Never    Marital Status: Never married      Family History:  The patient's family history includes Diabetes in his mother; Hyperlipidemia in his mother; Hypertension in his mother.  ROS:   12-point review of systems is negative unless otherwise noted in the HPI.   EKGs/Labs/Other Studies Reviewed:    Studies reviewed were summarized above. The additional studies were reviewed today:  TEE 05/01/2022: 1. No LV thrombus. Left ventricular ejection fraction, by estimation, is  <20%. The left ventricle has severely decreased function. The left  ventricular internal cavity size was mildly dilated.   2. Right ventricular systolic function is moderately reduced. The right  ventricular size is normal.   3. Smoke but no thrombus in LA appendage. Left atrial size was moderately  dilated. No left atrial/left atrial appendage thrombus was detected.   4. Right atrial size was mildly dilated.   5. Functional mitral regurgitation with restricted posterior leaflet. The  mitral valve is abnormal. Moderate mitral valve regurgitation. No evidence  of mitral stenosis.   6. The aortic valve is tricuspid. Aortic valve regurgitation is not  visualized. No aortic stenosis is present.   7. Normal caliber thoracic aorta with minimal plaque.   8. No PFO or ASD by color doppler.  __________   RHC 04/28/2022: Conclusions: Severely elevated left and right heart filling pressures. Low Fick cardiac output/index. Successful placement of triple-lumen central venous catheter via the right internal jugular vein.   Recommendations: Transfer to stepdown. Initiate milrinone 0.25 mcg/kg/min, with titration based on urine output and cooximetry. Hold carvedilol in the setting of low-output heart failure. Restart furosemide 80 mg IV BID; titrate as needed for net negative fluid balance of at least 2 L per 24 hours. __________   Limited echo 12/08/2021: 1. Left ventricular ejection fraction, by estimation, is <20%. The left  ventricle has severely decreased  function. The left ventricle has no  regional wall motion abnormalities. The left ventricular internal cavity  size was mildly to moderately dilated.   2. Right ventricular systolic function is normal. The right ventricular  size is normal. There is mildly elevated pulmonary artery systolic  pressure.   3. Left atrial size was severely dilated.   4. Right  atrial size was mildly dilated.   5. The mitral valve is normal in structure. Moderate to severe mitral  valve regurgitation. No evidence of mitral stenosis.   6. The aortic valve is normal in structure. Aortic valve regurgitation is  not visualized. No aortic stenosis is present.   7. Moderately dilated pulmonary artery.   8. The inferior vena cava is dilated in size with <50% respiratory  variability, suggesting right atrial pressure of 15 mmHg.  __________   2D echo 09/29/2021:  1. Left ventricular ejection fraction, by estimation, is <20%. The left  ventricle has severely decreased function. The left ventricle has no  regional wall motion abnormalities. The left ventricular internal cavity  size was moderately dilated. Left  ventricular diastolic parameters are indeterminate.   2. Right ventricular systolic function is mildly reduced. The right  ventricular size is normal. Tricuspid regurgitation signal is inadequate  for assessing PA pressure.   3. Left atrial size was moderately dilated.   4. Right atrial size was mildly dilated.   5. The mitral valve is normal in structure. Moderate mitral valve  regurgitation. No evidence of mitral stenosis.   6. The aortic valve is normal in structure. Aortic valve regurgitation is  not visualized. Aortic valve sclerosis/calcification is present, without  any evidence of aortic stenosis.   7. The inferior vena cava is dilated in size with <50% respiratory  variability, suggesting right atrial pressure of 15 mmHg.  __________   TEE 03/05/2021: 1. Left ventricular ejection fraction, by  estimation, is <20%. The left  ventricle has severely decreased function. The left ventricular internal  cavity size was mildly dilated.   2. Right ventricular systolic function is severely reduced. The right  ventricular size is normal.   3. Left atrial size was mildly dilated. No left atrial/left atrial  appendage thrombus was detected.   4. The mitral valve is normal in structure. Mild to moderate mitral valve  regurgitation.   5. The aortic valve is tricuspid. Aortic valve regurgitation is not  visualized.   6. Agitated saline contrast bubble study was negative, with no evidence  of any interatrial shunt. __________   Limited echo 10/29/2020: 1. Left ventricular ejection fraction, by estimation, is 30 to 35%. The  left ventricle has moderately decreased function. The left ventricle  demonstrates global hypokinesis. The left ventricular internal cavity size  was borderline dilated. There is mild   left ventricular hypertrophy.   2. Right ventricular systolic function is mildly reduced. The right  ventricular size is normal.   3. Mild to moderate mitral valve regurgitation.  __________   2D echo 09/13/2020: 1. Left ventricular ejection fraction, by estimation, is 25 to 30%. The  left ventricle has severely decreased function. The left ventricle  demonstrates global hypokinesis. The left ventricular internal cavity size  was mildly dilated. Left ventricular  diastolic parameters are indeterminate. The average left ventricular  global longitudinal strain is -6.7 %. The global longitudinal strain is  abnormal.   2. Right ventricular systolic function is moderately reduced. The right  ventricular size is normal. Tricuspid regurgitation signal is inadequate  for assessing PA pressure.   3. The mitral valve is normal in structure. Moderate mitral valve  regurgitation.   4. The inferior vena cava is dilated in size with <50% respiratory  variability, suggesting right atrial pressure  of 15 mmHg.  __________   2D echo 01/31/2020: 1. Left ventricular ejection fraction, by estimation, is 45 to 50%. The  left ventricle has  normal function. The left ventricle demonstrates  regional wall motion abnormalities. Abnormal (paradoxical) septal motion,  consistent with left bundle branch  block. Left ventricular diastolic parameters are indeterminate.   2. Right ventricular systolic function is normal. The right ventricular  size is normal. Tricuspid regurgitation signal is inadequate for assessing  PA pressure.   3. The mitral valve is normal in structure. No evidence of mitral valve  regurgitation.   4. The aortic valve was not well visualized. Aortic valve regurgitation  is not visualized. No aortic stenosis is present.   5. The inferior vena cava is normal in size with greater than 50%  respiratory variability, suggesting right atrial pressure of 3 mmHg.  __________   Limited echo 12/03/2016: - Procedure narrative: This was a LIMITED TTE to assess LVEF.  - Left ventricle: The cavity size was mildly dilated. There was    mild concentric hypertrophy. Systolic function was mildly    reduced. The estimated ejection fraction was in the range of 45%    to 50%. Diffuse hypokinesis.  __________   2D echo 06/02/2016: - Left ventricle: The cavity size was mildly dilated. There was    mild concentric hypertrophy. Systolic function was moderately to    severely reduced. The estimated ejection fraction was in the    range of 30% to 35%. Diffuse hypokinesis. Features are consistent    with a pseudonormal left ventricular filling pattern, with    concomitant abnormal relaxation and increased filling pressure    (grade 2 diastolic dysfunction).  - Mitral valve: There was mild regurgitation.  - Left atrium: The atrium was moderately dilated.  - Pulmonary arteries: Systolic pressure could not be accurately    estimated.  __________   2D echo 02/21/2015: - Left ventricle: The  cavity size was normal. There was mild    concentric hypertrophy. Systolic function was normal. The    estimated ejection fraction was in the range of 50% to 55%.    Unable to exclude inferior-posterior wall hypokinesis. Left    ventricular diastolic function parameters were normal.  - Mitral valve: There was mild regurgitation.  - Left atrium: The atrium was mildly dilated.  - Right ventricle: Systolic function was normal.  - Pulmonary arteries: Systolic pressure was within the normal    range.   Impressions:   - Challenging image quality. Rhythm is normal sinus.  __________   LHC 01/09/2015: Angiographically minimal CAD Non-ishcemic Cardiomyopathy Systemic Hypertension with Mild-Moderately increase LVEDP after pre-cath hydration.     This confirms the presumed diagnosis of Non-ischemic Cardiomyopathy.   Plan: Discharge home after Bed-rest & TR band removal Continue current Cardiomyopathy Medications - BB, Entresto & Lasix Will refer back to Dr. Graciela Husbands for reconsideration of ICD.   EKG:  EKG is ordered today.  The EKG ordered today demonstrates ***  Recent Labs: 05/03/2022: Magnesium 2.7 05/18/2022: Hemoglobin 13.0; Platelets 252 06/02/2022: ALT 18; TSH 9.181 06/17/2022: B Natriuretic Peptide 897.4; BUN 61; Creatinine, Ser 3.63; Potassium 4.8; Sodium 139  Recent Lipid Panel    Component Value Date/Time   CHOL 141 01/09/2022 0657   CHOL 249 (H) 10/11/2018 1226   CHOL 118 03/08/2014 0410   TRIG 140 01/09/2022 0657   TRIG 95 03/08/2014 0410   HDL 25 (L) 01/09/2022 0657   HDL 36 (L) 10/11/2018 1226   HDL 28 (L) 03/08/2014 0410   CHOLHDL 5.6 01/09/2022 0657   VLDL 28 01/09/2022 0657   VLDL 19 03/08/2014 0410   LDLCALC 88 01/09/2022 0657  LDLCALC 174 (H) 10/11/2018 1226   LDLCALC 71 03/08/2014 0410    PHYSICAL EXAM:    VS:  There were no vitals taken for this visit.  BMI: There is no height or weight on file to calculate BMI.  Physical Exam  Wt Readings from Last 3  Encounters:  07/17/22 256 lb (116.1 kg)  06/17/22 256 lb (116.1 kg)  06/02/22 240 lb (108.9 kg)     ASSESSMENT & PLAN:   Chronic combined systolic and diastolic CHF/biventricular dysfunction with NICM:   Persistent A-fib:   CKD stage IV: Serum creatinine stable at 3.41 on 7/16. Followed by nephrology.   HTN: Blood pressure   Nonobstructive CAD/aortic atherosclerosis/HLD: ***. LDL 88 in 12/2021.   Tobacco use:    {Are you ordering a CV Procedure (e.g. stress test, cath, DCCV, TEE, etc)?   Press F2        :[id number]}     Disposition: F/u with Dr. Mariah Milling or an APP in ***.   Medication Adjustments/Labs and Tests Ordered: Current medicines are reviewed at length with the patient today.  Concerns regarding medicines are outlined above. Medication changes, Labs and Tests ordered today are summarized above and listed in the Patient Instructions accessible in Encounters.   Signed, Eula Listen, PA-C 09/08/2022 2:48 PM     [facility] [place] [address] [place], [place] [address] [phone]

## 2022-09-11 ENCOUNTER — Ambulatory Visit: Payer: Medicaid Other | Attending: Physician Assistant | Admitting: Physician Assistant

## 2022-09-11 ENCOUNTER — Encounter: Payer: Medicaid Other | Admitting: Internal Medicine

## 2022-09-14 ENCOUNTER — Encounter: Payer: Self-pay | Admitting: Physician Assistant

## 2022-09-20 ENCOUNTER — Emergency Department
Admission: EM | Admit: 2022-09-20 | Discharge: 2022-09-20 | Disposition: A | Discharge status: Home / Self Care | Payer: Medicaid Other | Attending: Emergency Medicine | Admitting: Emergency Medicine

## 2022-09-20 DIAGNOSIS — N189 Chronic kidney disease, unspecified: Secondary | ICD-10-CM | POA: Diagnosis not present

## 2022-09-20 DIAGNOSIS — R1084 Generalized abdominal pain: Secondary | ICD-10-CM | POA: Insufficient documentation

## 2022-09-20 DIAGNOSIS — R112 Nausea with vomiting, unspecified: Secondary | ICD-10-CM | POA: Diagnosis present

## 2022-09-20 DIAGNOSIS — A084 Viral intestinal infection, unspecified: Secondary | ICD-10-CM

## 2022-09-20 DIAGNOSIS — R197 Diarrhea, unspecified: Secondary | ICD-10-CM | POA: Diagnosis not present

## 2022-09-20 LAB — URINALYSIS, COMPLETE (UACMP) WITH MICROSCOPIC
Bacteria, UA: NONE SEEN
Bilirubin Urine: NEGATIVE
Glucose, UA: >=500 mg/dL — AB
Ketones, ur: NEGATIVE mg/dL
Nitrite: NEGATIVE
Protein, ur: 100 mg/dL — AB
Specific Gravity, Urine: 1.008 (ref 1.005–1.030)
WBC, UA: >50 WBC/hpf (ref 0–5)
pH: 6 (ref 5.0–8.0)

## 2022-09-20 LAB — CBC
HCT: 47.1 % (ref 39.0–52.0)
Hemoglobin: 15.2 g/dL (ref 13.0–17.0)
MCH: 27 pg (ref 26.0–34.0)
MCHC: 32.3 g/dL (ref 30.0–36.0)
MCV: 83.5 fL (ref 80.0–100.0)
Platelets: 309 10*3/uL (ref 150–400)
RBC: 5.64 MIL/uL (ref 4.22–5.81)
RDW: 18.2 % — ABNORMAL HIGH (ref 11.5–15.5)
WBC: 8.5 10*3/uL (ref 4.0–10.5)
nRBC: 0 % (ref 0.0–0.2)

## 2022-09-20 LAB — COMPREHENSIVE METABOLIC PANEL
ALT: 16 U/L (ref 0–44)
AST: 23 U/L (ref 15–41)
Albumin: 3.7 g/dL (ref 3.5–5.0)
Alkaline Phosphatase: 103 U/L (ref 38–126)
Anion gap: 12 (ref 5–15)
BUN: 30 mg/dL — ABNORMAL HIGH (ref 6–20)
CO2: 22 mmol/L (ref 22–32)
Calcium: 9.1 mg/dL (ref 8.9–10.3)
Chloride: 101 mmol/L (ref 98–111)
Creatinine, Ser: 3.5 mg/dL — ABNORMAL HIGH (ref 0.61–1.24)
GFR, Estimated: 19 mL/min — ABNORMAL LOW (ref >60)
Glucose, Bld: 126 mg/dL — ABNORMAL HIGH (ref 70–99)
Potassium: 4.1 mmol/L (ref 3.5–5.1)
Sodium: 135 mmol/L (ref 135–145)
Total Bilirubin: 1.5 mg/dL — ABNORMAL HIGH (ref 0.3–1.2)
Total Protein: 7.6 g/dL (ref 6.5–8.1)

## 2022-09-20 LAB — CHLAMYDIA/NGC RT PCR (ARMC ONLY)
Chlamydia Tr: NOT DETECTED
N gonorrhoeae: NOT DETECTED

## 2022-09-20 LAB — LIPASE, BLOOD: Lipase: 26 U/L (ref 11–51)

## 2022-09-20 MED ORDER — ONDANSETRON HCL 4 MG/2ML IJ SOLN
4.0000 mg | Freq: Once | INTRAMUSCULAR | Status: AC
  Administered 2022-09-20: 4 mg via INTRAVENOUS
  Filled 2022-09-20: qty 2

## 2022-09-20 MED ORDER — ONDANSETRON 4 MG PO TBDP: 4.0000 mg | ORAL_TABLET | Freq: Three times a day (TID) | ORAL | 0 refills | Status: DC | PRN

## 2022-09-20 MED ORDER — FOSFOMYCIN TROMETHAMINE 3 G PO PACK
3.0000 g | PACK | Freq: Once | ORAL | Status: AC
  Administered 2022-09-20: 3 g via ORAL
  Filled 2022-09-20: qty 3

## 2022-09-20 MED ORDER — SODIUM CHLORIDE 0.9 % IV BOLUS
1000.0000 mL | Freq: Once | INTRAVENOUS | Status: AC
  Administered 2022-09-20: 1000 mL via INTRAVENOUS

## 2022-09-20 NOTE — ED Provider Notes (Signed)
[facility] Provider Note    Event Date/Time   First MD Initiated Contact with Patient 09/20/22 1238     (approximate)  History   Chief Complaint: Nausea (N/V/D since last night. States he ate steak that was sitting in the microwave for a couple days. )  HPI  ANDRU CLOONAN is a 57 y.o. male with a past medical history of alcohol use, CKD, hyperlipidemia, paroxysmal atrial fibrillation, presents to the emergency department for nausea vomiting and diarrhea.  According to the patient since last night he has been experiencing nausea vomiting diarrhea began vomiting once again this morning so he came to the emergency department for evaluation.  Patient states diffuse abdominal cramping but denies any focal pain.  Patient states he thinks he ate something bad.  Denies any fever.  Physical Exam   Triage Vital Signs: ED Triage Vitals  Encounter Vitals Group     BP 09/20/22 1237 (!) 141/100     Systolic BP Percentile --      Diastolic BP Percentile --      Pulse Rate 09/20/22 1237 81     Resp 09/20/22 1237 18     Temp 09/20/22 1239 98 F (36.7 C)     Temp Source 09/20/22 1239 Oral     SpO2 09/20/22 1237 97 %     Weight --      Height --      Head Circumference --      Peak Flow --      Pain Score 09/20/22 1235 8     Pain Loc --      Pain Education --      Exclude from Growth Chart --     Most recent vital signs: Vitals:   09/20/22 1237 09/20/22 1239  BP: (!) 141/100   Pulse: 81   Resp: 18   Temp:  98 F (36.7 C)  SpO2: 97%     General: Awake, no distress.  CV:  Good peripheral perfusion.  Regular rate and rhythm  Resp:  Normal effort.  Equal breath sounds bilaterally.  Abd:  No distention.  Soft, slight diffuse tenderness without any significant or focal tenderness identified.  No rebound or guarding.  ED Results / Procedures / Treatments   MEDICATIONS ORDERED IN ED: Medications  ondansetron (ZOFRAN) injection 4 mg (has no  administration in time range)  sodium chloride 0.9 % bolus 1,000 mL (has no administration in time range)     IMPRESSION / MDM / ASSESSMENT AND PLAN / ED COURSE  I reviewed the triage vital signs and the nursing notes.  Patient's presentation is most consistent with acute presentation with potential threat to life or bodily function.  Patient presents to the emergency department for nausea vomiting diarrhea since last night.  Patient believes he ate something bad.  Denies any fever.  No focal tenderness on exam does state abdominal cramping.  We will check labs including CBC chemistry lipase and urinalysis.  We will IV hydrate, and treat with Zofran and continue to closely monitor.  Patient agreeable to plan of care and workup.  Highly suspect gastroenteritis.  Patient CBC is normal, chemistry is reassuring, showing chronic renal insufficiency but unchanged from baseline.  Urinalysis does show greater than 50 white cells but no bacteria have sent a urine culture.  A one-time dose of fosfomycin has been ordered.  Patient states he is feeling much better.  Will discharge patient with Zofran to be used if needed supportive  care and my typical gastroenteritis return precautions.  Patient agreeable to plan of care  FINAL CLINICAL IMPRESSION(S) / ED DIAGNOSES   Nausea vomiting diarrhea  Rx / DC Orders   Zofran  Note:  This document was prepared using Dragon voice recognition software and may include unintentional dictation errors.   Minna Antis, MD 09/20/22 1425

## 2022-09-25 ENCOUNTER — Other Ambulatory Visit: Payer: Self-pay

## 2022-09-25 DIAGNOSIS — I130 Hypertensive heart and chronic kidney disease with heart failure and stage 1 through stage 4 chronic kidney disease, or unspecified chronic kidney disease: Secondary | ICD-10-CM | POA: Diagnosis present

## 2022-09-25 DIAGNOSIS — I480 Paroxysmal atrial fibrillation: Secondary | ICD-10-CM | POA: Diagnosis present

## 2022-09-25 DIAGNOSIS — Z8673 Personal history of transient ischemic attack (TIA), and cerebral infarction without residual deficits: Secondary | ICD-10-CM

## 2022-09-25 DIAGNOSIS — E785 Hyperlipidemia, unspecified: Secondary | ICD-10-CM | POA: Diagnosis present

## 2022-09-25 DIAGNOSIS — I428 Other cardiomyopathies: Secondary | ICD-10-CM | POA: Diagnosis present

## 2022-09-25 DIAGNOSIS — Z7984 Long term (current) use of oral hypoglycemic drugs: Secondary | ICD-10-CM

## 2022-09-25 DIAGNOSIS — Z83438 Family history of other disorder of lipoprotein metabolism and other lipidemia: Secondary | ICD-10-CM

## 2022-09-25 DIAGNOSIS — Z87891 Personal history of nicotine dependence: Secondary | ICD-10-CM

## 2022-09-25 DIAGNOSIS — Z833 Family history of diabetes mellitus: Secondary | ICD-10-CM

## 2022-09-25 DIAGNOSIS — Z8249 Family history of ischemic heart disease and other diseases of the circulatory system: Secondary | ICD-10-CM

## 2022-09-25 DIAGNOSIS — K8020 Calculus of gallbladder without cholecystitis without obstruction: Secondary | ICD-10-CM | POA: Diagnosis present

## 2022-09-25 DIAGNOSIS — N184 Chronic kidney disease, stage 4 (severe): Secondary | ICD-10-CM | POA: Diagnosis present

## 2022-09-25 DIAGNOSIS — I5042 Chronic combined systolic (congestive) and diastolic (congestive) heart failure: Secondary | ICD-10-CM | POA: Diagnosis present

## 2022-09-25 DIAGNOSIS — F1011 Alcohol abuse, in remission: Secondary | ICD-10-CM | POA: Diagnosis present

## 2022-09-25 DIAGNOSIS — G4733 Obstructive sleep apnea (adult) (pediatric): Secondary | ICD-10-CM | POA: Diagnosis present

## 2022-09-25 DIAGNOSIS — K851 Biliary acute pancreatitis without necrosis or infection: Principal | ICD-10-CM | POA: Diagnosis present

## 2022-09-25 DIAGNOSIS — Z888 Allergy status to other drugs, medicaments and biological substances status: Secondary | ICD-10-CM

## 2022-09-25 DIAGNOSIS — Z79890 Hormone replacement therapy (postmenopausal): Secondary | ICD-10-CM

## 2022-09-25 DIAGNOSIS — Z7901 Long term (current) use of anticoagulants: Secondary | ICD-10-CM

## 2022-09-25 DIAGNOSIS — Z91012 Allergy to eggs: Secondary | ICD-10-CM

## 2022-09-25 DIAGNOSIS — E039 Hypothyroidism, unspecified: Secondary | ICD-10-CM | POA: Diagnosis present

## 2022-09-25 DIAGNOSIS — Z79899 Other long term (current) drug therapy: Secondary | ICD-10-CM

## 2022-09-25 LAB — COMPREHENSIVE METABOLIC PANEL
ALT: 21 U/L (ref 0–44)
AST: 70 U/L — ABNORMAL HIGH (ref 15–41)
Albumin: 3.4 g/dL — ABNORMAL LOW (ref 3.5–5.0)
Alkaline Phosphatase: 164 U/L — ABNORMAL HIGH (ref 38–126)
Anion gap: 10 (ref 5–15)
BUN: 28 mg/dL — ABNORMAL HIGH (ref 6–20)
CO2: 24 mmol/L (ref 22–32)
Calcium: 8.3 mg/dL — ABNORMAL LOW (ref 8.9–10.3)
Chloride: 101 mmol/L (ref 98–111)
Creatinine, Ser: 3.12 mg/dL — ABNORMAL HIGH (ref 0.61–1.24)
GFR, Estimated: 22 mL/min — ABNORMAL LOW (ref >60)
Glucose, Bld: 139 mg/dL — ABNORMAL HIGH (ref 70–99)
Potassium: 3.5 mmol/L (ref 3.5–5.1)
Sodium: 135 mmol/L (ref 135–145)
Total Bilirubin: 1 mg/dL (ref 0.3–1.2)
Total Protein: 6.8 g/dL (ref 6.5–8.1)

## 2022-09-25 LAB — CBC
HCT: 42.3 % (ref 39.0–52.0)
Hemoglobin: 13.7 g/dL (ref 13.0–17.0)
MCH: 27.3 pg (ref 26.0–34.0)
MCHC: 32.4 g/dL (ref 30.0–36.0)
MCV: 84.3 fL (ref 80.0–100.0)
Platelets: 261 10*3/uL (ref 150–400)
RBC: 5.02 MIL/uL (ref 4.22–5.81)
RDW: 18.1 % — ABNORMAL HIGH (ref 11.5–15.5)
WBC: 8.4 10*3/uL (ref 4.0–10.5)
nRBC: 0 % (ref 0.0–0.2)

## 2022-09-25 NOTE — ED Triage Notes (Signed)
See first nurse note. Pt endorses EMS report. Denies n/v/d. Pt alert and oriented following commands. Breathing unlabored speaking in full sentences. Symmetric chest rise and fall noted. Reports last thoracentesis was in January.

## 2022-09-25 NOTE — ED Triage Notes (Signed)
First Nurse Note:  BIB [facility] from home. C/o abd pain that woke pt up from sleep ~1 hr pta. Hx of fluid build up in abd with need for paracentesis. Takes Lasix PRN. Denies chest pain or SOB. EMS reports pain epigastric and tender on palpation.   EMS VS: 152/100 HR 75 95% RA

## 2022-09-26 ENCOUNTER — Emergency Department: Payer: Medicaid Other

## 2022-09-26 ENCOUNTER — Encounter: Payer: Self-pay | Admitting: Radiology

## 2022-09-26 ENCOUNTER — Inpatient Hospital Stay
Admission: EM | Admit: 2022-09-26 | Discharge: 2022-09-29 | DRG: 439 | Disposition: A | Discharge status: Home / Self Care | Payer: Medicaid Other | Attending: Internal Medicine | Admitting: Internal Medicine

## 2022-09-26 DIAGNOSIS — Z87891 Personal history of nicotine dependence: Secondary | ICD-10-CM | POA: Diagnosis not present

## 2022-09-26 DIAGNOSIS — Z8673 Personal history of transient ischemic attack (TIA), and cerebral infarction without residual deficits: Secondary | ICD-10-CM | POA: Diagnosis not present

## 2022-09-26 DIAGNOSIS — I480 Paroxysmal atrial fibrillation: Secondary | ICD-10-CM | POA: Diagnosis present

## 2022-09-26 DIAGNOSIS — Z91012 Allergy to eggs: Secondary | ICD-10-CM | POA: Diagnosis not present

## 2022-09-26 DIAGNOSIS — E039 Hypothyroidism, unspecified: Secondary | ICD-10-CM | POA: Diagnosis present

## 2022-09-26 DIAGNOSIS — K851 Biliary acute pancreatitis without necrosis or infection: Principal | ICD-10-CM

## 2022-09-26 DIAGNOSIS — Z7901 Long term (current) use of anticoagulants: Secondary | ICD-10-CM | POA: Diagnosis not present

## 2022-09-26 DIAGNOSIS — Z833 Family history of diabetes mellitus: Secondary | ICD-10-CM | POA: Diagnosis not present

## 2022-09-26 DIAGNOSIS — I5042 Chronic combined systolic (congestive) and diastolic (congestive) heart failure: Secondary | ICD-10-CM | POA: Diagnosis present

## 2022-09-26 DIAGNOSIS — K8020 Calculus of gallbladder without cholecystitis without obstruction: Secondary | ICD-10-CM | POA: Diagnosis present

## 2022-09-26 DIAGNOSIS — N184 Chronic kidney disease, stage 4 (severe): Secondary | ICD-10-CM | POA: Diagnosis present

## 2022-09-26 DIAGNOSIS — E785 Hyperlipidemia, unspecified: Secondary | ICD-10-CM | POA: Diagnosis present

## 2022-09-26 DIAGNOSIS — K859 Acute pancreatitis without necrosis or infection, unspecified: Secondary | ICD-10-CM | POA: Diagnosis not present

## 2022-09-26 DIAGNOSIS — I10 Essential (primary) hypertension: Secondary | ICD-10-CM | POA: Diagnosis present

## 2022-09-26 DIAGNOSIS — Z79890 Hormone replacement therapy (postmenopausal): Secondary | ICD-10-CM | POA: Diagnosis not present

## 2022-09-26 DIAGNOSIS — Z8249 Family history of ischemic heart disease and other diseases of the circulatory system: Secondary | ICD-10-CM | POA: Diagnosis not present

## 2022-09-26 DIAGNOSIS — I428 Other cardiomyopathies: Secondary | ICD-10-CM

## 2022-09-26 DIAGNOSIS — E1129 Type 2 diabetes mellitus with other diabetic kidney complication: Secondary | ICD-10-CM | POA: Diagnosis present

## 2022-09-26 DIAGNOSIS — F1011 Alcohol abuse, in remission: Secondary | ICD-10-CM | POA: Diagnosis present

## 2022-09-26 DIAGNOSIS — Z83438 Family history of other disorder of lipoprotein metabolism and other lipidemia: Secondary | ICD-10-CM | POA: Diagnosis not present

## 2022-09-26 DIAGNOSIS — Z79899 Other long term (current) drug therapy: Secondary | ICD-10-CM | POA: Diagnosis not present

## 2022-09-26 DIAGNOSIS — G4733 Obstructive sleep apnea (adult) (pediatric): Secondary | ICD-10-CM | POA: Diagnosis present

## 2022-09-26 DIAGNOSIS — Z888 Allergy status to other drugs, medicaments and biological substances status: Secondary | ICD-10-CM | POA: Diagnosis not present

## 2022-09-26 DIAGNOSIS — I130 Hypertensive heart and chronic kidney disease with heart failure and stage 1 through stage 4 chronic kidney disease, or unspecified chronic kidney disease: Secondary | ICD-10-CM | POA: Diagnosis present

## 2022-09-26 DIAGNOSIS — Z7984 Long term (current) use of oral hypoglycemic drugs: Secondary | ICD-10-CM | POA: Diagnosis not present

## 2022-09-26 LAB — BRAIN NATRIURETIC PEPTIDE: B Natriuretic Peptide: 284 pg/mL — ABNORMAL HIGH (ref 0.0–100.0)

## 2022-09-26 MED ORDER — EMPAGLIFLOZIN 10 MG PO TABS
10.0000 mg | ORAL_TABLET | Freq: Every day | ORAL | Status: DC
  Administered 2022-09-26 – 2022-09-29 (×4): 10 mg via ORAL
  Filled 2022-09-26 (×4): qty 1

## 2022-09-26 MED ORDER — APIXABAN 5 MG PO TABS
5.0000 mg | ORAL_TABLET | Freq: Two times a day (BID) | ORAL | Status: DC
  Administered 2022-09-26 – 2022-09-29 (×7): 5 mg via ORAL
  Filled 2022-09-26 (×7): qty 1

## 2022-09-26 MED ORDER — MORPHINE SULFATE (PF) 4 MG/ML IV SOLN
4.0000 mg | Freq: Once | INTRAVENOUS | Status: AC
  Administered 2022-09-26: 4 mg via INTRAVENOUS
  Filled 2022-09-26: qty 1

## 2022-09-26 MED ORDER — AMIODARONE HCL 200 MG PO TABS
200.0000 mg | ORAL_TABLET | Freq: Every day | ORAL | Status: DC
  Administered 2022-09-26 – 2022-09-29 (×4): 200 mg via ORAL
  Filled 2022-09-26 (×4): qty 1

## 2022-09-26 MED ORDER — GADOBUTROL 1 MMOL/ML IV SOLN
10.0000 mL | Freq: Once | INTRAVENOUS | Status: AC | PRN
  Administered 2022-09-26: 10 mL via INTRAVENOUS

## 2022-09-26 MED ORDER — LEVOTHYROXINE SODIUM 50 MCG PO TABS
100.0000 ug | ORAL_TABLET | Freq: Every day | ORAL | Status: DC
  Administered 2022-09-26 – 2022-09-29 (×4): 100 ug via ORAL
  Filled 2022-09-26 (×4): qty 2

## 2022-09-26 MED ORDER — ONDANSETRON HCL 4 MG/2ML IJ SOLN
4.0000 mg | Freq: Four times a day (QID) | INTRAMUSCULAR | Status: DC | PRN
  Administered 2022-09-29 (×2): 4 mg via INTRAVENOUS
  Filled 2022-09-26 (×2): qty 2

## 2022-09-26 MED ORDER — ONDANSETRON HCL 4 MG PO TABS: 4.0000 mg | ORAL_TABLET | Freq: Four times a day (QID) | ORAL | Status: DC | PRN

## 2022-09-26 MED ORDER — LACTATED RINGERS IV SOLN: INTRAVENOUS | Status: AC

## 2022-09-26 MED ORDER — HYDROMORPHONE HCL 1 MG/ML IJ SOLN
0.5000 mg | INTRAMUSCULAR | Status: DC | PRN
  Administered 2022-09-26 – 2022-09-29 (×11): 1 mg via INTRAVENOUS
  Filled 2022-09-26 (×11): qty 1

## 2022-09-26 MED ORDER — ACETAMINOPHEN 650 MG RE SUPP: 650.0000 mg | Freq: Four times a day (QID) | RECTAL | Status: DC | PRN

## 2022-09-26 MED ORDER — ENOXAPARIN SODIUM 40 MG/0.4ML IJ SOSY: 40.0000 mg | PREFILLED_SYRINGE | INTRAMUSCULAR | Status: DC

## 2022-09-26 MED ORDER — ATORVASTATIN CALCIUM 20 MG PO TABS
80.0000 mg | ORAL_TABLET | Freq: Every day | ORAL | Status: DC
  Administered 2022-09-26 – 2022-09-29 (×4): 80 mg via ORAL
  Filled 2022-09-26 (×4): qty 4

## 2022-09-26 MED ORDER — PANCRELIPASE (LIP-PROT-AMYL) 36000-114000 UNITS PO CPEP
36000.0000 [IU] | ORAL_CAPSULE | Freq: Three times a day (TID) | ORAL | Status: DC
  Administered 2022-09-26 – 2022-09-29 (×10): 36000 [IU] via ORAL
  Filled 2022-09-26 (×11): qty 1

## 2022-09-26 MED ORDER — ACETAMINOPHEN 325 MG PO TABS: 650.0000 mg | ORAL_TABLET | Freq: Four times a day (QID) | ORAL | Status: DC | PRN

## 2022-09-26 MED ORDER — ISOSORB DINITRATE-HYDRALAZINE 20-37.5 MG PO TABS
0.5000 | ORAL_TABLET | Freq: Every day | ORAL | Status: DC
  Administered 2022-09-26 – 2022-09-29 (×4): 0.5 via ORAL
  Filled 2022-09-26 (×4): qty 0.5

## 2022-09-26 MED ORDER — FAMOTIDINE 20 MG PO TABS
20.0000 mg | ORAL_TABLET | Freq: Every day | ORAL | Status: DC
  Administered 2022-09-26 – 2022-09-29 (×4): 20 mg via ORAL
  Filled 2022-09-26 (×4): qty 1

## 2022-09-26 NOTE — H&P (Signed)
History and Physical    Patient: Hector [name] ZOX:[id number] DOB: [birth year] DOA: 09/26/2022 DOS: the patient was seen and examined on 09/26/2022 PCP: Oswaldo Conroy, MD  Patient coming from: Home  Chief Complaint:  Chief Complaint  Patient presents with   Abdominal Pain    HPI: VOSHON PETRO is a 57 y.o. male with medical history significant for A. fib on Eliquis, recurrent pancreatitis, sCHF with EF < 20%, CKD-IV, HTN, prior stroke, hypothyroidism, alcohol abuse in remission, prior GI bleed who presents to the ED with mid abdominal pain that awoke him from sleep.  Denies vomiting.  Has no fever or chills and no diarrhea. ED course and data review: Vitals unremarkable.  Workup significant for lipase 1175 with AST 70, alk phos 161, normal total bilirubin of 1.Creatinine at baseline at 3.12 CBC unremarkable.  BNP 284.  Urinalysis unremarkable. EKG, personally viewed and interpreted showing NSR at 71 with no acute ST-T wave changes. CT abdomen and pelvis showing acute pancreatitis and cholelithiasis with no CT finding of acute cholecystitis among other findings as detailed below: IMPRESSION: 1. Acute pancreatitis.  No pseudocyst formation. 2. Cholelithiasis with no CT finding of acute cholecystitis. 3. Colonic diverticulosis with no acute diverticulitis. 4. Small fat containing umbilical hernia. 5. Slightly more conspicuous indeterminate 1.6 cm right renal lesion and chronic indeterminate 1.9 cm left renal lesion. When the patient is clinically stable and able to follow directions and hold their breath (preferably as an outpatient) further evaluation with dedicated MRI renal protocol should be considered. 6. Limited evaluation on this noncontrast study.    Patient treated with morphine Hospitalist consulted for admission.  MRCP requested   IMPRESSION: 1. Mild, diffuse pancreatic inflammatory fat stranding, consistent with acute pancreatitis. No evidence of  parenchymal necrosis or acute pancreatic fluid collection. 2. Tiny gallstones and sludge in the gallbladder. No evidence of acute cholecystitis. 3. No biliary ductal dilatation. No calculi identified within the common bile duct to the ampulla. 4. Simple, benign and hemorrhagic or proteinaceous bilateral renal cortical cysts, without solid mass or suspicious contrast enhancement. No further follow-up or characterization is required for these benign Bosniak category I and II cysts.    Past Medical History:  Diagnosis Date   Alcohol abuse    Alcoholic cardiomyopathy (HCC)    a. 12/2007 MV: EF 28%, no isch;  b. 8/12 Echo: EF 25-35%; c. 02/2014 Echo: EF 20-25%; d. 12/2014 Cath: minimal CAD; e. 01/2015 Echo: EF 50-55%;  d. 05/2016 Echo: EF 30-35%, diff HK, gr2 DD; e. 11/2016 Echo: EF 45-50%, diff HK; f. 09/2021 Echo: EF <20%; g. 11/2021 Echo: EF<20%.   Chronic combined systolic (congestive) and diastolic (congestive) heart failure (HCC)    a. 05/2016 Echo: EF 30-35%; b. 11/2016 Echo: EF 45-50%, diff HK; c. 09/2021 Echo: EF < 20%; d. 11/2021 Echo: EF <20%, no rwma, Nl RV fxn, sev dil LA, mod-sev MR. mod dil PA w/ mildly elev PASP.   CKD (chronic kidney disease), stage III (HCC)    Elevated troponin (chronic)    Essential hypertension    GI bleed 11/2013   Hyperlipidemia    Pancreatitis    Paroxysmal A-fib (HCC)    a. new onset s/p unsuccessful TEE/DCCV on 08/16/2014; b. CHA2DS2VASc = 3-> eliquis (freq noncompliant); c. 02/2021 s/p TEE/DCCV; d. Prev on amio->d/c 2/2 abnl TFTs.   Paroxysmal atrial flutter (HCC)    Sleep-disordered breathing    Has yet to have a sleep study   Stroke [facility])  Past Surgical History:  Procedure Laterality Date   CARDIAC CATHETERIZATION N/A 01/09/2015   Procedure: Left Heart Cath and Coronary Angiography;  Surgeon: Marykay Lex, MD;  Location: [facility] INVASIVE CV LAB;  Service: Cardiovascular;  Laterality: N/A;   CARDIOVERSION N/A 03/05/2021   Procedure:  CARDIOVERSION;  Surgeon: Debbe Odea, MD;  Location: [facility] ORS;  Service: Cardiovascular;  Laterality: N/A;   CENTRAL LINE INSERTION  04/28/2022   Procedure: CENTRAL LINE INSERTION;  Surgeon: Yvonne Kendall, MD;  Location: [facility] INVASIVE CV LAB;  Service: Cardiovascular;;   COLONOSCOPY N/A 07/22/2020   Procedure: COLONOSCOPY;  Surgeon: Toledo, Boykin Nearing, MD;  Location: [facility] ENDOSCOPY;  Service: Gastroenterology;  Laterality: N/A;   ELECTROPHYSIOLOGIC STUDY N/A 08/16/2014   Procedure: CARDIOVERSION;  Surgeon: Antonieta Iba, MD;  Location: [facility] ORS;  Service: Cardiovascular;  Laterality: N/A;   ESOPHAGOGASTRODUODENOSCOPY (EGD) WITH PROPOFOL N/A 05/04/2022   Procedure: ESOPHAGOGASTRODUODENOSCOPY (EGD) WITH PROPOFOL;  Surgeon: Toney Reil, MD;  Location: [facility] ENDOSCOPY;  Service: Gastroenterology;  Laterality: N/A;   FLEXIBLE SIGMOIDOSCOPY N/A 10/10/2015   Procedure: FLEXIBLE SIGMOIDOSCOPY;  Surgeon: Christena Deem, MD;  Location: [facility] ENDOSCOPY;  Service: Endoscopy;  Laterality: N/A;   KNEE SURGERY Right    NM MYOVIEW LTD  November 2011   No ischemia or infarction. EF 50-55% ( no improvement from 2009 Myoview EF of 28%   RIGHT HEART CATH N/A 04/28/2022   Procedure: RIGHT HEART CATH;  Surgeon: Yvonne Kendall, MD;  Location: [facility] INVASIVE CV LAB;  Service: Cardiovascular;  Laterality: N/A;   TEE WITHOUT CARDIOVERSION N/A 08/16/2014   Procedure: TRANSESOPHAGEAL ECHOCARDIOGRAM (TEE);  Surgeon: Antonieta Iba, MD;  Location: [facility] ORS;  Service: Cardiovascular;  Laterality: N/A;   TEE WITHOUT CARDIOVERSION N/A 03/05/2021   Procedure: TRANSESOPHAGEAL ECHOCARDIOGRAM (TEE);  Surgeon: Debbe Odea, MD;  Location: [facility] ORS;  Service: Cardiovascular;  Laterality: N/A;   TEE WITHOUT CARDIOVERSION N/A 05/01/2022   Procedure: TRANSESOPHAGEAL ECHOCARDIOGRAM;  Surgeon: Laurey Morale, MD;  Location: [facility] ORS;  Service: Cardiovascular;  Laterality: N/A;  TEE with cardioversion   Social  History:  reports that he quit smoking about 24 years ago. His smoking use included cigarettes. He started smoking about 36 years ago. He has a 12 pack-year smoking history. He has never used smokeless tobacco. He reports that he does not currently use alcohol. He reports that he does not use drugs.  Allergies  Allergen Reactions   Esomeprazole Magnesium Other (See Comments)    Suspected interstitial nephritis 2018   Egg-Derived Products Rash    Family History  Problem Relation Age of Onset   Hypertension Mother    Hyperlipidemia Mother    Diabetes Mother     Prior to Admission medications   Medication Sig Start Date End Date Taking? Authorizing Provider  allopurinol (ZYLOPRIM) 100 MG tablet Take 0.5 tablets (50 mg total) by mouth daily. Patient not taking: Reported on 07/17/2022 10/03/21   Alberteen Sam, MD  amiodarone (PACERONE) 200 MG tablet Take 1 tablet (200 mg total) by mouth daily. 06/29/22   Bensimhon, Bevelyn Buckles, MD  apixaban (ELIQUIS) 5 MG TABS tablet Take 1 tablet (5 mg total) by mouth 2 (two) times daily. 10/02/21   Danford, Earl Lites, MD  atorvastatin (LIPITOR) 80 MG tablet Take 1 tablet (80 mg total) by mouth daily. 11/24/21   Hollice Espy, MD  empagliflozin (JARDIANCE) 10 MG TABS tablet Take 1 tablet (10 mg total) by mouth daily. 05/26/22   Delma Freeze, FNP  famotidine (PEPCID)  20 MG tablet Take 1 tablet (20 mg total) by mouth daily. 05/05/22   Arnetha Courser, MD  ferrous sulfate 325 (65 FE) MG EC tablet Take 325 mg by mouth daily with breakfast.    [provider]  isosorbide-hydrALAZINE (BIDIL) 20-37.5 MG tablet Take 0.5 tablets by mouth 3 (three) times daily. Patient taking differently: Take 0.5 tablets by mouth daily. 06/17/22   Bensimhon, Bevelyn Buckles, MD  levothyroxine (SYNTHROID) 100 MCG tablet Take 1 tablet (100 mcg total) by mouth daily before breakfast. Patient not taking: Reported on 07/17/2022 11/24/21   Hollice Espy, MD   lipase/protease/amylase (CREON) 36000 UNITS CPEP capsule Take 1 capsule (36,000 Units total) by mouth 3 (three) times daily with meals. 05/04/22   Arnetha Courser, MD  metolazone (ZAROXOLYN) 2.5 MG tablet Take 1 tablet every Monday with of Potassium. 06/29/22   Bensimhon, Bevelyn Buckles, MD  ondansetron (ZOFRAN-ODT) 4 MG disintegrating tablet Take 1 tablet (4 mg total) by mouth every 8 (eight) hours as needed for nausea or vomiting. 09/20/22   Minna Antis, MD  potassium chloride SA (KLOR-CON M) 20 MEQ tablet Take with Metolazone. 06/17/22   Bensimhon, Bevelyn Buckles, MD  torsemide (DEMADEX) 20 MG tablet Take 40 mg by mouth daily.    [provider]    Physical Exam: Vitals:   09/26/22 0115 09/26/22 0118 09/26/22 0130 09/26/22 0300  BP:  124/81 (!) 154/105 (!) 150/105  Pulse: 61  71 64  Resp: (!) 22  (!) 25   Temp:      TempSrc:      SpO2: 98%  94% 98%  Weight:      Height:       Physical Exam Vitals and nursing note reviewed.  Constitutional:      General: He is not in acute distress. HENT:     Head: Normocephalic and atraumatic.  Cardiovascular:     Rate and Rhythm: Normal rate and regular rhythm.     Heart sounds: Normal heart sounds.  Pulmonary:     Effort: Pulmonary effort is normal.     Breath sounds: Normal breath sounds.  Abdominal:     Palpations: Abdomen is soft.     Tenderness: There is abdominal tenderness in the epigastric area.  Neurological:     Mental Status: Mental status is at baseline.     Labs on Admission: I have personally reviewed following labs and imaging studies  CBC: Recent Labs  Lab 09/20/22 1243 09/25/22 2249  WBC 8.5 8.4  HGB 15.2 13.7  HCT 47.1 42.3  MCV 83.5 84.3  PLT 309 261   Basic Metabolic Panel: Recent Labs  Lab 09/20/22 1243 09/25/22 2249  NA 135 135  K 4.1 3.5  CL 101 101  CO2 22 24  GLUCOSE 126* 139*  BUN 30* 28*  CREATININE 3.50* 3.12*  CALCIUM 9.1 8.3*   GFR: Estimated Creatinine Clearance: 32.7  mL/min (A) (by C-G formula based on SCr of 3.12 mg/dL (H)). Liver Function Tests: Recent Labs  Lab 09/20/22 1243 09/25/22 2249  AST 23 70*  ALT 16 21  ALKPHOS 103 164*  BILITOT 1.5* 1.0  PROT 7.6 6.8  ALBUMIN 3.7 3.4*   Recent Labs  Lab 09/20/22 1243 09/25/22 2249  LIPASE 26 1,175*   No results for input(s): "AMMONIA" in the last 168 hours. Coagulation Profile: No results for input(s): "INR", "PROTIME" in the last 168 hours. Cardiac Enzymes: No results for input(s): "CKTOTAL", "CKMB", "CKMBINDEX", "TROPONINI" in the last 168 hours.  BNP (last 3 results) No results for input(s): "PROBNP" in the last 8760 hours. HbA1C: No results for input(s): "HGBA1C" in the last 72 hours. CBG: No results for input(s): "GLUCAP" in the last 168 hours. Lipid Profile: No results for input(s): "CHOL", "HDL", "LDLCALC", "TRIG", "CHOLHDL", "LDLDIRECT" in the last 72 hours. Thyroid Function Tests: No results for input(s): "TSH", "T4TOTAL", "FREET4", "T3FREE", "THYROIDAB" in the last 72 hours. Anemia Panel: No results for input(s): "VITAMINB12", "FOLATE", "FERRITIN", "TIBC", "IRON", "RETICCTPCT" in the last 72 hours. Urine analysis:    Component Value Date/Time   COLORURINE YELLOW (A) 09/25/2022 2305   APPEARANCEUR CLEAR (A) 09/25/2022 2305   APPEARANCEUR Clear 02/26/2014 1126   LABSPEC 1.009 09/25/2022 2305   LABSPEC 1.008 02/26/2014 1126   PHURINE 5.0 09/25/2022 2305   GLUCOSEU >=500 (A) 09/25/2022 2305   GLUCOSEU Negative 02/26/2014 1126   HGBUR SMALL (A) 09/25/2022 2305   BILIRUBINUR NEGATIVE 09/25/2022 2305   BILIRUBINUR Negative 02/26/2014 1126   KETONESUR NEGATIVE 09/25/2022 2305   PROTEINUR 100 (A) 09/25/2022 2305   NITRITE NEGATIVE 09/25/2022 2305   LEUKOCYTESUR TRACE (A) 09/25/2022 2305   LEUKOCYTESUR 1+ 02/26/2014 1126    Radiological Exams on Admission: MR ABDOMEN MRCP W WO CONTAST  Result Date: 09/26/2022 CLINICAL DATA:  Elevated LFTs, abdominal pain, evaluate for  choledocholithiasis EXAM: MRI ABDOMEN WITHOUT AND WITH CONTRAST (INCLUDING MRCP) TECHNIQUE: Multiplanar multisequence MR imaging of the abdomen was performed both before and after the administration of intravenous contrast. Heavily T2-weighted images of the biliary and pancreatic ducts were obtained, and three-dimensional MRCP images were rendered by post processing. CONTRAST:  10mL GADAVIST GADOBUTROL 1 MMOL/ML IV SOLN COMPARISON:  CT abdomen pelvis, 09/26/2022 FINDINGS: Lower chest: No acute abnormality. Hepatobiliary: No solid liver abnormality is seen. Tiny gallstones and sludge in the gallbladder. No gallbladder wall thickening. No biliary ductal dilatation. No calculi identified within the common bile duct to the ampulla. Pancreas: Mild, diffuse pancreatic inflammatory fat stranding. No evidence of parenchymal necrosis or acute pancreatic fluid collection. No pancreatic ductal dilatation. Spleen: Normal in size without significant abnormality. Adrenals/Urinary Tract: Adrenal glands are unremarkable. Simple, benign bilateral renal cortical cysts, as well as an intrinsically T1 hyperintense, nonenhancing hemorrhagic or proteinaceous cyst of the lateral midportion of the left kidney and of the posterior superior pole of the right kidney, for which no further follow-up or characterization is required. Kidneys are otherwise normal, without obvious renal calculi, solid lesion, or hydronephrosis. Stomach/Bowel: Stomach is within normal limits. No evidence of bowel wall thickening, distention, or inflammatory changes. Vascular/Lymphatic: No significant vascular findings are present. No enlarged abdominal lymph nodes. Other: No abdominal wall hernia or abnormality. No ascites. Musculoskeletal: No acute or significant osseous findings. IMPRESSION: 1. Mild, diffuse pancreatic inflammatory fat stranding, consistent with acute pancreatitis. No evidence of parenchymal necrosis or acute pancreatic fluid collection. 2. Tiny  gallstones and sludge in the gallbladder. No evidence of acute cholecystitis. 3. No biliary ductal dilatation. No calculi identified within the common bile duct to the ampulla. 4. Simple, benign and hemorrhagic or proteinaceous bilateral renal cortical cysts, without solid mass or suspicious contrast enhancement. No further follow-up or characterization is required for these benign Bosniak category I and II cysts. Electronically Signed   By: Jearld Lesch M.D.   On: 09/26/2022 05:09   MR 3D Recon At Scanner  Result Date: 09/26/2022 CLINICAL DATA:  Elevated LFTs, abdominal pain, evaluate for choledocholithiasis EXAM: MRI ABDOMEN WITHOUT AND WITH CONTRAST (INCLUDING MRCP) TECHNIQUE: Multiplanar multisequence MR imaging of the abdomen  was performed both before and after the administration of intravenous contrast. Heavily T2-weighted images of the biliary and pancreatic ducts were obtained, and three-dimensional MRCP images were rendered by post processing. CONTRAST:  10mL GADAVIST GADOBUTROL 1 MMOL/ML IV SOLN COMPARISON:  CT abdomen pelvis, 09/26/2022 FINDINGS: Lower chest: No acute abnormality. Hepatobiliary: No solid liver abnormality is seen. Tiny gallstones and sludge in the gallbladder. No gallbladder wall thickening. No biliary ductal dilatation. No calculi identified within the common bile duct to the ampulla. Pancreas: Mild, diffuse pancreatic inflammatory fat stranding. No evidence of parenchymal necrosis or acute pancreatic fluid collection. No pancreatic ductal dilatation. Spleen: Normal in size without significant abnormality. Adrenals/Urinary Tract: Adrenal glands are unremarkable. Simple, benign bilateral renal cortical cysts, as well as an intrinsically T1 hyperintense, nonenhancing hemorrhagic or proteinaceous cyst of the lateral midportion of the left kidney and of the posterior superior pole of the right kidney, for which no further follow-up or characterization is required. Kidneys are otherwise  normal, without obvious renal calculi, solid lesion, or hydronephrosis. Stomach/Bowel: Stomach is within normal limits. No evidence of bowel wall thickening, distention, or inflammatory changes. Vascular/Lymphatic: No significant vascular findings are present. No enlarged abdominal lymph nodes. Other: No abdominal wall hernia or abnormality. No ascites. Musculoskeletal: No acute or significant osseous findings. IMPRESSION: 1. Mild, diffuse pancreatic inflammatory fat stranding, consistent with acute pancreatitis. No evidence of parenchymal necrosis or acute pancreatic fluid collection. 2. Tiny gallstones and sludge in the gallbladder. No evidence of acute cholecystitis. 3. No biliary ductal dilatation. No calculi identified within the common bile duct to the ampulla. 4. Simple, benign and hemorrhagic or proteinaceous bilateral renal cortical cysts, without solid mass or suspicious contrast enhancement. No further follow-up or characterization is required for these benign Bosniak category I and II cysts. Electronically Signed   By: Jearld Lesch M.D.   On: 09/26/2022 05:09   CT ABDOMEN PELVIS WO CONTRAST  Result Date: 09/26/2022 CLINICAL DATA:  pancreatitis, eval complications, hepatobiliary obstruction. hx alcohol abuse and CKD EXAM: CT ABDOMEN AND PELVIS WITHOUT CONTRAST TECHNIQUE: Multidetector CT imaging of the abdomen and pelvis was performed following the standard protocol without IV contrast. RADIATION DOSE REDUCTION: This exam was performed according to the departmental dose-optimization program which includes automated exposure control, adjustment of the mA and/or kV according to patient size and/or use of iterative reconstruction technique. COMPARISON:  CT abdomen pelvis 02/23/2022 CT abdomen pelvis 11/26/2013, CT abdomen pelvis 05/06/2021, MRCP 09/10/2021, CT abdomen pelvis 11/30/2010 FINDINGS: Lower chest: Chronic scattered calcified and noncalcified right lower lobe pulmonary micronodules. No acute  abnormality. Hepatobiliary: No focal liver abnormality. Calcified gallstone noted within the gallbladder lumen. No gallbladder wall thickening or pericholecystic fluid. No biliary dilatation. Pancreas: No focal lesion. Slightly hazy pancreatic contour. Mild peripancreatic fat stranding. No main pancreatic ductal dilatation. No pseudocyst formation. Spleen: Normal in size without focal abnormality. Adrenals/Urinary Tract: No adrenal nodule bilaterally. Bilateral kidneys enhance symmetrically. There is a chronic stable in size (increased in 2012) 1.9 cm left hyperdense renal lesion with a density of 51 Hounsfield units. Slightly more conspicuous 1.6 cm right renal lesion with a density of 33 Hounsfield units. Fluid density lesions within bilateral kidneys likely represent simple renal cysts. Simple renal cysts, in the absence of clinically indicated signs/symptoms, require no independent follow-up. No hydronephrosis. No hydroureter. The urinary bladder is unremarkable. Stomach/Bowel: Stomach is within normal limits. No evidence of bowel wall thickening or dilatation. Diffuse colonic diverticulosis. Appendix appears normal. Vascular/Lymphatic: No abdominal aorta or iliac aneurysm. Mild atherosclerotic  plaque of the aorta and its branches. No abdominal, pelvic, or inguinal lymphadenopathy. Reproductive: Prostate is unremarkable. Other: No intraperitoneal free fluid. No intraperitoneal free gas. No organized fluid collection. Musculoskeletal: Small fat containing umbilical hernia. No suspicious lytic or blastic osseous lesions. No acute displaced fracture. IMPRESSION: 1. Acute pancreatitis.  No pseudocyst formation. 2. Cholelithiasis with no CT finding of acute cholecystitis. 3. Colonic diverticulosis with no acute diverticulitis. 4. Small fat containing umbilical hernia. 5. Slightly more conspicuous indeterminate 1.6 cm right renal lesion and chronic indeterminate 1.9 cm left renal lesion. When the patient is clinically  stable and able to follow directions and hold their breath (preferably as an outpatient) further evaluation with dedicated MRI renal protocol should be considered. 6. Limited evaluation on this noncontrast study. Electronically Signed   By: Tish Frederickson M.D.   On: 09/26/2022 02:27     Data Reviewed: Relevant notes from primary care and specialist visits, past discharge summaries as available in EHR, including Care Everywhere. Prior diagnostic testing as pertinent to current admission diagnoses Updated medications and problem lists for reconciliation ED course, including vitals, labs, imaging, treatment and response to treatment Triage notes, nursing and pharmacy notes and ED provider's notes Notable results as noted in HPI   Assessment and Plan:   Acute pancreatitis, suspect gallstone pancreatitis Cholelithiasis without cholecystitis CT abdomen and pelvis showing acute pancreatitis without evidence of cholecystitis  MRCP pending---> no CBD stone or ductal dilatation Will keep n.p.o. and hydrate Pain control, IV antiemetics   Nonischemic cardiomyopathy: EF <20% on TEE 04/2022 Monitor for fluid overload with IV hydration  IV hydralazine, metoprolol and Lasix while n.p.o  resume home carvedilol, hydralazine and torsemide and back to regular oral diet   Hypothyroidism Continue levothyroxine   CKD (chronic kidney disease) stage 4, GFR 15-29 ml/min (HCC) Renal function at baseline   Obstructive sleep apnea CPAP   Essential hypertension Hydralazine IV as needed while n.p.o.   Paroxysmal atrial fibrillation (HCC) Continue apixaban and Coreg(hold while npo)    DVT prophylaxis: SCD  Consults: none  Advance Care Planning:   Code Status: Prior   Family Communication: none  Disposition Plan: Back to previous home environment  Severity of Illness: The appropriate patient status for this patient is INPATIENT. Inpatient status is judged to be reasonable and necessary in order  to provide the required intensity of service to ensure the patient's safety. The patient's presenting symptoms, physical exam findings, and initial radiographic and laboratory data in the context of their chronic comorbidities is felt to place them at high risk for further clinical deterioration. Furthermore, it is not anticipated that the patient will be medically stable for discharge from the hospital within 2 midnights of admission.   * I certify that at the point of admission it is my clinical judgment that the patient will require inpatient hospital care spanning beyond 2 midnights from the point of admission due to high intensity of service, high risk for further deterioration and high frequency of surveillance required.*  Author: Andris Baumann, MD 09/26/2022 3:19 AM  For on call review [email or web address].

## 2022-09-26 NOTE — Plan of Care (Signed)

## 2022-09-26 NOTE — ED Provider Notes (Signed)
[facility] Provider Note    Event Date/Time   First MD Initiated Contact with Patient 09/26/22 0023     (approximate)   History   Abdominal Pain   HPI  Hector [name] is a 57 y.o. male who presents to the ED for evaluation of Abdominal Pain   Review of CHF clinic visit from May.  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  A-fib on Eliquis  Patient presents to the ED for evaluation of periumbilical abdominal pain just in the past few hours tonight.  No nausea, emesis, fever, stool changes or urinary changes.   Physical Exam   Triage Vital Signs: ED Triage Vitals  Encounter Vitals Group     BP 09/25/22 2250 (!) 131/91     Systolic BP Percentile --      Diastolic BP Percentile --      Pulse Rate 09/25/22 2250 70     Resp 09/25/22 2250 20     Temp 09/25/22 2250 98.3 F (36.8 C)     Temp Source 09/25/22 2250 Oral     SpO2 09/25/22 2250 90 %     Weight 09/25/22 2249 245 lb (111.1 kg)     Height 09/25/22 2249 5\' 11"  (1.803 m)     Head Circumference --      Peak Flow --      Pain Score 09/25/22 2302 7     Pain Loc --      Pain Education --      Exclude from Growth Chart --     Most recent vital signs: Vitals:   09/26/22 0315 09/26/22 0330  BP:    Pulse: 67 66  Resp:    Temp:    SpO2: 94% 95%    General: Awake, no distress.  CV:  Good peripheral perfusion.  Resp:  Normal effort.  Abd:  No distention.  Periumbilical tenderness without peritoneal features MSK:  No deformity noted.  Neuro:  No focal deficits appreciated. Other:     ED Results / Procedures / Treatments   Labs (all labs ordered are listed, but only abnormal results are displayed) Labs Reviewed  URINALYSIS, ROUTINE W REFLEX MICROSCOPIC - Abnormal; Notable for the following components:      Result Value   Color, Urine YELLOW (*)    APPearance CLEAR (*)    Glucose, UA >=500 (*)    Hgb urine dipstick SMALL (*)    Protein,  ur 100 (*)    Leukocytes,Ua TRACE (*)    All other components within normal limits  BRAIN NATRIURETIC PEPTIDE - Abnormal; Notable for the following components:   B Natriuretic Peptide 284.0 (*)    All other components within normal limits  LIPASE, BLOOD - Abnormal; Notable for the following components:   Lipase 1,175 (*)    All other components within normal limits  COMPREHENSIVE METABOLIC PANEL - Abnormal; Notable for the following components:   Glucose, Bld 139 (*)    BUN 28 (*)    Creatinine, Ser 3.12 (*)    Calcium 8.3 (*)    Albumin 3.4 (*)    AST 70 (*)    Alkaline Phosphatase 164 (*)    GFR, Estimated 22 (*)    All other components within normal limits  CBC - Abnormal; Notable for the following components:   RDW 18.1 (*)    All other components within normal limits  CBC  CREATININE, SERUM  EKG   RADIOLOGY CT abdomen/pelvis interpreted by me with gallstones and evidence of acute pancreatitis  Official radiology report(s): MR ABDOMEN MRCP W WO CONTAST  Result Date: 09/26/2022 CLINICAL DATA:  Elevated LFTs, abdominal pain, evaluate for choledocholithiasis EXAM: MRI ABDOMEN WITHOUT AND WITH CONTRAST (INCLUDING MRCP) TECHNIQUE: Multiplanar multisequence MR imaging of the abdomen was performed both before and after the administration of intravenous contrast. Heavily T2-weighted images of the biliary and pancreatic ducts were obtained, and three-dimensional MRCP images were rendered by post processing. CONTRAST:  10mL GADAVIST GADOBUTROL 1 MMOL/ML IV SOLN COMPARISON:  CT abdomen pelvis, 09/26/2022 FINDINGS: Lower chest: No acute abnormality. Hepatobiliary: No solid liver abnormality is seen. Tiny gallstones and sludge in the gallbladder. No gallbladder wall thickening. No biliary ductal dilatation. No calculi identified within the common bile duct to the ampulla. Pancreas: Mild, diffuse pancreatic inflammatory fat stranding. No evidence of parenchymal necrosis or acute pancreatic  fluid collection. No pancreatic ductal dilatation. Spleen: Normal in size without significant abnormality. Adrenals/Urinary Tract: Adrenal glands are unremarkable. Simple, benign bilateral renal cortical cysts, as well as an intrinsically T1 hyperintense, nonenhancing hemorrhagic or proteinaceous cyst of the lateral midportion of the left kidney and of the posterior superior pole of the right kidney, for which no further follow-up or characterization is required. Kidneys are otherwise normal, without obvious renal calculi, solid lesion, or hydronephrosis. Stomach/Bowel: Stomach is within normal limits. No evidence of bowel wall thickening, distention, or inflammatory changes. Vascular/Lymphatic: No significant vascular findings are present. No enlarged abdominal lymph nodes. Other: No abdominal wall hernia or abnormality. No ascites. Musculoskeletal: No acute or significant osseous findings. IMPRESSION: 1. Mild, diffuse pancreatic inflammatory fat stranding, consistent with acute pancreatitis. No evidence of parenchymal necrosis or acute pancreatic fluid collection. 2. Tiny gallstones and sludge in the gallbladder. No evidence of acute cholecystitis. 3. No biliary ductal dilatation. No calculi identified within the common bile duct to the ampulla. 4. Simple, benign and hemorrhagic or proteinaceous bilateral renal cortical cysts, without solid mass or suspicious contrast enhancement. No further follow-up or characterization is required for these benign Bosniak category I and II cysts. Electronically Signed   By: Jearld Lesch M.D.   On: 09/26/2022 05:09   MR 3D Recon At Scanner  Result Date: 09/26/2022 CLINICAL DATA:  Elevated LFTs, abdominal pain, evaluate for choledocholithiasis EXAM: MRI ABDOMEN WITHOUT AND WITH CONTRAST (INCLUDING MRCP) TECHNIQUE: Multiplanar multisequence MR imaging of the abdomen was performed both before and after the administration of intravenous contrast. Heavily T2-weighted images of the  biliary and pancreatic ducts were obtained, and three-dimensional MRCP images were rendered by post processing. CONTRAST:  10mL GADAVIST GADOBUTROL 1 MMOL/ML IV SOLN COMPARISON:  CT abdomen pelvis, 09/26/2022 FINDINGS: Lower chest: No acute abnormality. Hepatobiliary: No solid liver abnormality is seen. Tiny gallstones and sludge in the gallbladder. No gallbladder wall thickening. No biliary ductal dilatation. No calculi identified within the common bile duct to the ampulla. Pancreas: Mild, diffuse pancreatic inflammatory fat stranding. No evidence of parenchymal necrosis or acute pancreatic fluid collection. No pancreatic ductal dilatation. Spleen: Normal in size without significant abnormality. Adrenals/Urinary Tract: Adrenal glands are unremarkable. Simple, benign bilateral renal cortical cysts, as well as an intrinsically T1 hyperintense, nonenhancing hemorrhagic or proteinaceous cyst of the lateral midportion of the left kidney and of the posterior superior pole of the right kidney, for which no further follow-up or characterization is required. Kidneys are otherwise normal, without obvious renal calculi, solid lesion, or hydronephrosis. Stomach/Bowel: Stomach is within normal limits. No evidence  of bowel wall thickening, distention, or inflammatory changes. Vascular/Lymphatic: No significant vascular findings are present. No enlarged abdominal lymph nodes. Other: No abdominal wall hernia or abnormality. No ascites. Musculoskeletal: No acute or significant osseous findings. IMPRESSION: 1. Mild, diffuse pancreatic inflammatory fat stranding, consistent with acute pancreatitis. No evidence of parenchymal necrosis or acute pancreatic fluid collection. 2. Tiny gallstones and sludge in the gallbladder. No evidence of acute cholecystitis. 3. No biliary ductal dilatation. No calculi identified within the common bile duct to the ampulla. 4. Simple, benign and hemorrhagic or proteinaceous bilateral renal cortical cysts,  without solid mass or suspicious contrast enhancement. No further follow-up or characterization is required for these benign Bosniak category I and II cysts. Electronically Signed   By: Jearld Lesch M.D.   On: 09/26/2022 05:09   CT ABDOMEN PELVIS WO CONTRAST  Result Date: 09/26/2022 CLINICAL DATA:  pancreatitis, eval complications, hepatobiliary obstruction. hx alcohol abuse and CKD EXAM: CT ABDOMEN AND PELVIS WITHOUT CONTRAST TECHNIQUE: Multidetector CT imaging of the abdomen and pelvis was performed following the standard protocol without IV contrast. RADIATION DOSE REDUCTION: This exam was performed according to the departmental dose-optimization program which includes automated exposure control, adjustment of the mA and/or kV according to patient size and/or use of iterative reconstruction technique. COMPARISON:  CT abdomen pelvis 02/23/2022 CT abdomen pelvis 11/26/2013, CT abdomen pelvis 05/06/2021, MRCP 09/10/2021, CT abdomen pelvis 11/30/2010 FINDINGS: Lower chest: Chronic scattered calcified and noncalcified right lower lobe pulmonary micronodules. No acute abnormality. Hepatobiliary: No focal liver abnormality. Calcified gallstone noted within the gallbladder lumen. No gallbladder wall thickening or pericholecystic fluid. No biliary dilatation. Pancreas: No focal lesion. Slightly hazy pancreatic contour. Mild peripancreatic fat stranding. No main pancreatic ductal dilatation. No pseudocyst formation. Spleen: Normal in size without focal abnormality. Adrenals/Urinary Tract: No adrenal nodule bilaterally. Bilateral kidneys enhance symmetrically. There is a chronic stable in size (increased in 2012) 1.9 cm left hyperdense renal lesion with a density of 51 Hounsfield units. Slightly more conspicuous 1.6 cm right renal lesion with a density of 33 Hounsfield units. Fluid density lesions within bilateral kidneys likely represent simple renal cysts. Simple renal cysts, in the absence of clinically indicated  signs/symptoms, require no independent follow-up. No hydronephrosis. No hydroureter. The urinary bladder is unremarkable. Stomach/Bowel: Stomach is within normal limits. No evidence of bowel wall thickening or dilatation. Diffuse colonic diverticulosis. Appendix appears normal. Vascular/Lymphatic: No abdominal aorta or iliac aneurysm. Mild atherosclerotic plaque of the aorta and its branches. No abdominal, pelvic, or inguinal lymphadenopathy. Reproductive: Prostate is unremarkable. Other: No intraperitoneal free fluid. No intraperitoneal free gas. No organized fluid collection. Musculoskeletal: Small fat containing umbilical hernia. No suspicious lytic or blastic osseous lesions. No acute displaced fracture. IMPRESSION: 1. Acute pancreatitis.  No pseudocyst formation. 2. Cholelithiasis with no CT finding of acute cholecystitis. 3. Colonic diverticulosis with no acute diverticulitis. 4. Small fat containing umbilical hernia. 5. Slightly more conspicuous indeterminate 1.6 cm right renal lesion and chronic indeterminate 1.9 cm left renal lesion. When the patient is clinically stable and able to follow directions and hold their breath (preferably as an outpatient) further evaluation with dedicated MRI renal protocol should be considered. 6. Limited evaluation on this noncontrast study. Electronically Signed   By: Tish Frederickson M.D.   On: 09/26/2022 02:27    PROCEDURES and INTERVENTIONS:  Procedures  Medications  amiodarone (PACERONE) tablet 200 mg (has no administration in time range)  atorvastatin (LIPITOR) tablet 80 mg (has no administration in time range)  isosorbide-hydrALAZINE (BIDIL) 20-37.5 MG  per tablet 0.5 tablet (has no administration in time range)  levothyroxine (SYNTHROID) tablet 100 mcg (100 mcg Oral Given 09/26/22 0606)  empagliflozin (JARDIANCE) tablet 10 mg (has no administration in time range)  lipase/protease/amylase (CREON) capsule 36,000 Units (has no administration in time range)   famotidine (PEPCID) tablet 20 mg (has no administration in time range)  apixaban (ELIQUIS) tablet 5 mg (has no administration in time range)  acetaminophen (TYLENOL) tablet 650 mg (has no administration in time range)    Or  acetaminophen (TYLENOL) suppository 650 mg (has no administration in time range)  ondansetron (ZOFRAN) tablet 4 mg (has no administration in time range)    Or  ondansetron (ZOFRAN) injection 4 mg (has no administration in time range)  lactated ringers infusion ( Intravenous New Bag/Given 09/26/22 0605)  HYDROmorphone (DILAUDID) injection 0.5-1 mg (has no administration in time range)  morphine (PF) 4 MG/ML injection 4 mg (4 mg Intravenous Given 09/26/22 0125)  gadobutrol (GADAVIST) 1 MMOL/ML injection 10 mL (10 mLs Intravenous Contrast Given 09/26/22 0405)     IMPRESSION / MDM / ASSESSMENT AND PLAN / ED COURSE  I reviewed the triage vital signs and the nursing notes.  Differential diagnosis includes, but is not limited to, pancreatitis, hepatobiliary obstruction or choledocholithiasis  {Patient presents with symptoms of an acute illness or injury that is potentially life-threatening.  Patient presents with periumbilical abdominal pain with evidence of gallstone pancreatitis requiring medical admission.  Localized pain without peritoneal features.  Blood work with elevated lipase.  Slight elevation of alk phos and AST question choledocholithiasis so MRCP obtained without evidence of such.  Signs of isolated acute pancreatitis alongside gallstones without signs of cholecystitis.  Consult with medicine for admission.  Normal CBC.  BNP is slightly elevated but no dyspnea or clinical concerns for CHF at this point.  Clinical Course as of 09/26/22 0610  Sat Sep 26, 2022  0259 Consult hospitalist who agrees to take a look for admission [DS]  0314 Callback from medicine, requesting MRCP before admission. I order this [DS]    Clinical Course User Index [DS] Delton Prairie, MD      FINAL CLINICAL IMPRESSION(S) / ED DIAGNOSES   Final diagnoses:  Gallstone pancreatitis     Rx / DC Orders   ED Discharge Orders     None        Note:  This document was prepared using Dragon voice recognition software and may include unintentional dictation errors.   Delton Prairie, MD 09/26/22 [phone]

## 2022-09-26 NOTE — Progress Notes (Signed)
Progress Note   Patient: JOHNWESLEY Hector [name] Hector [name]:[id number] DOB: 09-16-1965 DOA: 09/26/2022     0 DOS: the patient was seen and examined on 09/26/2022    Subjective:  Patient seen and examined at bedside this morning He tells me his abdominal pain is improving We discussed about advancing his diet from clear liquids He denies nausea vomiting chest pain cough or urinary complaints    Brief hospital course: JVEON POUND is a 57 y.o. male with medical history significant for A. fib on Eliquis, recurrent pancreatitis, sCHF with EF < 20%, CKD-IV, HTN, prior stroke, hypothyroidism, alcohol abuse in remission, prior GI bleed who presents to the ED with mid abdominal pain that awoke him from sleep.  Denies vomiting. Workup significant for lipase 1175 with AST 70, alk phos 161, normal total bilirubin of 1.Creatinine at baseline at 3.12 CBC unremarkable.  BNP 284.  Urinalysis unremarkable.  Abdominal CT scan showing findings of acute pancreatitis in the setting of cholelithiasis with no CT findings of acute cholecystitis.  Assessment and Plan:   Acute pancreatitis, suspect gallstone pancreatitis Cholelithiasis without cholecystitis CT abdomen and pelvis showing acute pancreatitis without evidence of cholecystitis Diets advance from liquid to soft diet today Continue symptomatic management with IV Zofran Continue as needed pain medication CT scan of the abdomen reviewed by me that showed findings of acute pancreatitis with no pseudocysts. Findings of cholelithiasis with findings suggestive of acute cholecystitis Personally reviewed patient's EKG that did not show any ST segment changes MRCP results reviewed which shows findings of acute pancreatitis with no associated complications  Bilateral renal lesion-incidental finding on CT scan of the abdomen However follow-up MRI ruled out any concerning lesions aside simple benign cyst.  Nonischemic cardiomyopathy: EF <20% on TEE 04/2022 Monitor  for fluid overload with IV hydration  IV hydralazine, metoprolol and Lasix while n.p.o  resume home carvedilol, hydralazine and torsemide and back to regular oral diet   Hypothyroidism Continue levothyroxine   CKD (chronic kidney disease) stage 4, GFR 15-29 ml/min (HCC) Monitor renal function closely   Obstructive sleep apnea CPAP at night   Essential hypertension Continue hydralazine Will resume torsemide when oral intake is adequate   Paroxysmal atrial fibrillation (HCC) Continue apixaban Holding metoprolol given bradycardia       DVT prophylaxis: SCD   Consults: none   Advance Care Planning:   Code Status: Prior    Family Communication: none   Disposition Plan: Back to previous home environment     Physical Exam: Constitutional:      General: Not in acute distress HENT:     Head: Normocephalic and atraumatic.  Cardiovascular:     Rate and Rhythm: Normal rate and regular rhythm.     Heart sounds: Normal heart sounds.  Pulmonary:     Effort: Pulmonary effort is normal.     Breath sounds: Normal breath sounds.  Abdominal: Minimal epigastric tenderness Neurological:     Mental Status: Mental status is at baseline.  Data Reviewed: I have personally reviewed patient's CT scan of the abdomen, EKG as documented above, CBC BMP nursing documentation, vitals as well as previous records  Vitals:   09/26/22 0645 09/26/22 0645 09/26/22 0909 09/26/22 1420  BP: (!) 152/102  (!) 141/95 129/84  Pulse: 68  (!) 58 66  Resp:   18 18  Temp:  (!) 97.4 F (36.3 C) (!) 97.5 F (36.4 C) 97.6 F (36.4 C)  TempSrc:  Axillary    SpO2: 95%  100% 98%  Weight:  Height:           Author: Loyce Dys, MD 09/26/2022 3:13 PM  For on call review [email or web address].

## 2022-09-27 DIAGNOSIS — K851 Biliary acute pancreatitis without necrosis or infection: Secondary | ICD-10-CM | POA: Diagnosis not present

## 2022-09-27 LAB — CBC WITH DIFFERENTIAL/PLATELET
Abs Immature Granulocytes: 0.11 10*3/uL — ABNORMAL HIGH (ref 0.00–0.07)
Basophils Absolute: 0.1 10*3/uL (ref 0.0–0.1)
Basophils Relative: 1 %
Eosinophils Absolute: 0.4 10*3/uL (ref 0.0–0.5)
Eosinophils Relative: 5 %
HCT: 43.6 % (ref 39.0–52.0)
Hemoglobin: 14 g/dL (ref 13.0–17.0)
Immature Granulocytes: 1 %
Lymphocytes Relative: 14 %
Lymphs Abs: 1.1 10*3/uL (ref 0.7–4.0)
MCH: 27 pg (ref 26.0–34.0)
MCHC: 32.1 g/dL (ref 30.0–36.0)
MCV: 84.2 fL (ref 80.0–100.0)
Monocytes Absolute: 0.6 10*3/uL (ref 0.1–1.0)
Monocytes Relative: 7 %
Neutro Abs: 5.8 10*3/uL (ref 1.7–7.7)
Neutrophils Relative %: 72 %
Platelets: 265 10*3/uL (ref 150–400)
RBC: 5.18 MIL/uL (ref 4.22–5.81)
RDW: 18.1 % — ABNORMAL HIGH (ref 11.5–15.5)
WBC: 7.9 10*3/uL (ref 4.0–10.5)
nRBC: 0 % (ref 0.0–0.2)

## 2022-09-27 LAB — BASIC METABOLIC PANEL WITH GFR
Anion gap: 8 (ref 5–15)
BUN: 28 mg/dL — ABNORMAL HIGH (ref 6–20)
CO2: 27 mmol/L (ref 22–32)
Calcium: 8.3 mg/dL — ABNORMAL LOW (ref 8.9–10.3)
Chloride: 101 mmol/L (ref 98–111)
Creatinine, Ser: 2.88 mg/dL — ABNORMAL HIGH (ref 0.61–1.24)
GFR, Estimated: 25 mL/min — ABNORMAL LOW (ref >60)
Glucose, Bld: 113 mg/dL — ABNORMAL HIGH (ref 70–99)
Potassium: 3.8 mmol/L (ref 3.5–5.1)
Sodium: 136 mmol/L (ref 135–145)

## 2022-09-27 MED ORDER — SODIUM CHLORIDE 0.9 % IV SOLN: INTRAVENOUS | Status: AC

## 2022-09-27 NOTE — Plan of Care (Signed)

## 2022-09-27 NOTE — TOC Progression Note (Signed)
Transition of Care [facility]) - Progression Note    Patient Details  Name: Hector [name] MRN: [id number] Date of Birth: 04-15-1965  [facility]) CM/SW Contact  Garret Reddish, RN Phone Number: 09/27/2022, 3:51 PM  Clinical Narrative:   Chart reviewed.  Noted that patient was admitted with Acute Pancreatitis.  Patient receiving Gentle IV hydration and still with some complaints of abdominal pain.  Patient has been advanced to a soft diet.    Patient reports that prior to admission he lived at home with his brother.  He was able to ambulate independently with no assistive devices.  He plans on returning back home with support of his brother on discharge.    No TOC needs identified.           Expected Discharge Plan and Services                                               Social Determinants of Health (SDOH) Interventions SDOH Screenings   Food Insecurity: No Food Insecurity (09/26/2022)  Housing: Low Risk  (09/26/2022)  Transportation Needs: No Transportation Needs (09/26/2022)  Utilities: Not At Risk (09/26/2022)  Depression (PHQ2-9): Low Risk  (08/19/2021)  Financial Resource Strain: Low Risk  (01/04/2019)  Physical Activity: Inactive (01/04/2019)  Social Connections: Moderately Isolated (01/04/2019)  Stress: No Stress Concern Present (01/04/2019)  Tobacco Use: Medium Risk (09/25/2022)    Readmission Risk Interventions    04/27/2022   11:48 AM 02/24/2022    3:53 PM 12/09/2021    1:09 PM  Readmission Risk Prevention Plan  Transportation Screening Complete Complete Complete  Medication Review Oceanographer) Complete Complete Complete  PCP or Specialist appointment within 3-5 days of discharge Complete  Complete  SW Recovery Care/Counseling Consult Complete Complete Complete  Palliative Care Screening Not Applicable Not Applicable Not Applicable  Skilled Nursing Facility Not Applicable Not Applicable Not Applicable

## 2022-09-27 NOTE — Progress Notes (Signed)
Progress Note   Patient: Hector [name] QVZ:[id number] DOB: 17-Jul-1965 DOA: 09/26/2022     1 DOS: the patient was seen and examined on 09/27/2022     Subjective:  Patient seen and examined at bedside this morning He tells me his abdominal pain is improving We discussed about advancing his diet from clear liquids He denies nausea vomiting chest pain cough or urinary complaints       Brief hospital course: Hector [name] is a 57 y.o. male with medical history significant for A. fib on Eliquis, recurrent pancreatitis, sCHF with EF < 20%, CKD-IV, HTN, prior stroke, hypothyroidism, alcohol abuse in remission, prior GI bleed who presents to the ED with mid abdominal pain that awoke him from sleep.  Denies vomiting. Workup significant for lipase 1175 with AST 70, alk phos 161, normal total bilirubin of 1.Creatinine at baseline at 3.12 CBC unremarkable.  BNP 284.  Urinalysis unremarkable.  Abdominal CT scan showing findings of acute pancreatitis in the setting of cholelithiasis with no CT findings of acute cholecystitis.   Assessment and Plan:   Acute pancreatitis, suspect gallstone pancreatitis Cholelithiasis without cholecystitis CT scan of the abdomen showing findings of acute pancreatitis Continue gentle IV fluid in the setting of heart failure This was reinitiated today given that patient still has some abdominal pain Continue advancing diet Continue pain management as well as IV Zofran CT scan of the abdomen reviewed by me that showed findings of acute pancreatitis with no pseudocysts. Findings of cholelithiasis with findings suggestive of acute cholecystitis Personally reviewed patient's EKG that did not show any ST segment changes MRCP results reviewed which shows findings of acute pancreatitis with no associated complications   Bilateral renal lesion-incidental finding on CT scan of the abdomen However follow-up MRI ruled out any concerning lesions aside simple benign cyst.    Nonischemic cardiomyopathy: EF <20% on TEE 04/2022 Monitor for fluid overload with IV hydration  IV hydralazine, metoprolol and Lasix while n.p.o  resume home carvedilol, hydralazine and torsemide and back to regular oral diet   Hypothyroidism Continue levothyroxine   CKD (chronic kidney disease) stage 4, GFR 15-29 ml/min (HCC) Creatinine at baseline we will continue to monitor closely   Obstructive sleep apnea Continue CPAP at night   Essential hypertension Continue hydralazine Will resume torsemide when oral intake is adequate   Paroxysmal atrial fibrillation (HCC) Continue apixaban Holding metoprolol given bradycardia       DVT prophylaxis: SCD   Consults: none   Advance Care Planning:   Code Status: Prior    Family Communication: none   Disposition Plan: Back to previous home environment         Physical Exam: Constitutional:      General: Lying in bed in no acute distress HENT:     Head: Normocephalic and atraumatic.  Cardiovascular:     Rate and Rhythm: Normal rate and regular rhythm.     Heart sounds: Normal heart sounds.  Pulmonary:     Effort: Pulmonary effort is normal.     Breath sounds: Normal breath sounds.  Abdominal: Mid abdominal tenderness Neurological:     Mental Status: Mental status is at baseline.   Data Reviewed: I have reviewed patient's CBC, BMP, nursing documentation vitals  Vitals:   09/26/22 1420 09/27/22 0042 09/27/22 0819 09/27/22 1521  BP: 129/84 (!) 138/97 (!) 137/96 (!) 135/92  Pulse: 66 (!) 58 (!) 57 62  Resp: 18 17 14 14   Temp: 97.6 F (36.4 C) 97.8 F (36.6 C)  98 F (36.7 C) 98.7 F (37.1 C)  TempSrc:      SpO2: 98% 95% 97% 96%  Weight:      Height:         Author: Loyce Dys, MD 09/27/2022 3:25 PM  For on call review [email or web address].

## 2022-09-28 DIAGNOSIS — K851 Biliary acute pancreatitis without necrosis or infection: Secondary | ICD-10-CM | POA: Diagnosis not present

## 2022-09-28 MED ORDER — SODIUM CHLORIDE 0.9 % IV SOLN: INTRAVENOUS | Status: DC

## 2022-09-28 MED ORDER — OXYCODONE HCL 5 MG PO TABS
5.0000 mg | ORAL_TABLET | Freq: Four times a day (QID) | ORAL | Status: DC | PRN
  Administered 2022-09-29: 5 mg via ORAL
  Filled 2022-09-28: qty 1

## 2022-09-28 NOTE — Progress Notes (Signed)
Progress Note   Patient: Hector [name]:[id number] DOB: 22-Oct-1965 DOA: 09/26/2022     2 DOS: the patient was seen and examined on 09/28/2022     Subjective:  Patient seen and examined at bedside this morning Tells me abdominal pain is slightly better today compared to yesterday IV fluid is being continued cautiously We will continue pain management   Brief hospital course: Hector [name] is a 57 y.o. male with medical history significant for A. fib on Eliquis, recurrent pancreatitis, sCHF with EF < 20%, CKD-IV, HTN, prior stroke, hypothyroidism, alcohol abuse in remission, prior GI bleed who presents to the ED with mid abdominal pain that awoke him from sleep.  Denies vomiting. Workup significant for lipase 1175 with AST 70, alk phos 161, normal total bilirubin of 1.Creatinine at baseline at 3.12 CBC unremarkable.  BNP 284.  Urinalysis unremarkable.  Abdominal CT scan showing findings of acute pancreatitis in the setting of cholelithiasis with no CT findings of acute cholecystitis.   Assessment and Plan:   Acute pancreatitis, suspect gallstone pancreatitis Cholelithiasis without cholecystitis CT scan of the abdomen showing findings of acute pancreatitis Continue gentle IV fluid in the setting of heart failure This was reinitiated today given that patient still has some abdominal pain Continue advancing diet Continue IV Zofran CT scan of the abdomen reviewed by me that showed findings of acute pancreatitis with no pseudocysts. IV fluid is being continued cautiously We will continue pain management Findings of cholelithiasis with findings suggestive of acute cholecystitis Personally reviewed patient's EKG that did not show any ST segment changes MRCP results reviewed which shows findings of acute pancreatitis with no associated complications   Bilateral renal lesion-incidental finding on CT scan of the abdomen However follow-up MRI ruled out any concerning lesions aside  simple benign cyst.   Nonischemic cardiomyopathy: EF <20% on TEE 04/2022 Monitor volume status cautiously while on IV fluid Continue home carvedilol, hydralazine and torsemide     Hypothyroidism Continue levothyroxine   CKD (chronic kidney disease) stage 4, GFR 15-29 ml/min (HCC) Creatinine at baseline we will continue to monitor closely   Obstructive sleep apnea Continue CPAP at night   Essential hypertension Continue hydralazine Will resume torsemide when oral intake is adequate   Paroxysmal atrial fibrillation (HCC) Continue apixaban Holding metoprolol given bradycardia       DVT prophylaxis: SCD   Consults: none   Advance Care Planning:   Code Status: Prior    Family Communication: none   Disposition Plan: Back to previous home environment         Physical Exam: Constitutional:      General: Lying in bed in no acute distress HENT:     Head: Normocephalic and atraumatic.  Cardiovascular:     Rate and Rhythm: Normal rate and regular rhythm.     Heart sounds: Normal heart sounds.  Pulmonary:     Effort: Pulmonary effort is normal.     Breath sounds: Normal breath sounds.  Abdominal: Mild tenderness in the mid abdomen Neurological:     Mental Status: Mental status is at baseline.   Data Reviewed: I have reviewed patient's labs including CBC, BMP, nursing documentation transition of care management documentation as well as vitals  Vitals:   09/27/22 1521 09/28/22 0033 09/28/22 0805 09/28/22 1551  BP: (!) 135/92 (!) 141/99 (!) 155/106 (!) 159/110  Pulse: 62 66 67 64  Resp: 14 20 16 14   Temp: 98.7 F (37.1 C) 98.4 F (36.9 C) 97.9 F (36.6  C) 98.4 F (36.9 C)  TempSrc:      SpO2: 96% 96% 98% 97%  Weight:      Height:        Author: Loyce Dys, MD 09/28/2022 4:44 PM  For on call review [email or web address].

## 2022-09-28 NOTE — Plan of Care (Signed)
  Problem: Education: Goal: Knowledge of General Education information will improve Description Including pain rating scale, medication(s)/side effects and non-pharmacologic comfort measures Outcome: Progressing   

## 2022-09-28 NOTE — Plan of Care (Signed)

## 2022-09-29 DIAGNOSIS — K851 Biliary acute pancreatitis without necrosis or infection: Secondary | ICD-10-CM | POA: Diagnosis not present

## 2022-09-29 MED ORDER — HYDRALAZINE HCL 20 MG/ML IJ SOLN
10.0000 mg | INTRAMUSCULAR | Status: AC | PRN
  Administered 2022-09-29 (×2): 10 mg via INTRAVENOUS
  Filled 2022-09-29 (×2): qty 1

## 2022-09-29 MED ORDER — ACETAMINOPHEN 325 MG PO TABS: 650.0000 mg | ORAL_TABLET | Freq: Four times a day (QID) | ORAL | 0 refills | Status: DC | PRN

## 2022-09-29 NOTE — Plan of Care (Signed)
  Problem: Education: Goal: Knowledge of General Education information will improve Description: Including pain rating scale, medication(s)/side effects and non-pharmacologic comfort measures Outcome: Progressing   Problem: Activity: Goal: Risk for activity intolerance will decrease Outcome: Progressing   Problem: Elimination: Goal: Will not experience complications related to urinary retention Outcome: Progressing   Problem: Pain Managment: Goal: General experience of comfort will improve Outcome: Progressing   Problem: Safety: Goal: Ability to remain free from injury will improve Outcome: Progressing   

## 2022-09-29 NOTE — Progress Notes (Signed)
Patient was given written and verbal discharge instructions, acknowledge understanding and states he will comply, taken to car by wheelchair, no distress noted when leaving the floor.

## 2022-09-29 NOTE — Discharge Summary (Signed)
Physician Discharge Summary   Patient: Hector [name] MRN: [id number] DOB: 05-28-1965  Admit date:     09/26/2022  Discharge date: 09/29/22  Discharge Physician: Loyce Dys   PCP: Oswaldo Conroy, MD    Discharge Diagnoses: Acute pancreatitis, suspect gallstone pancreatitis Cholelithiasis without cholecystitis Nonischemic cardiomyopathy: EF <20% on TEE 04/2022 Hypothyroidism CKD (chronic kidney disease) stage 4, GFR 15-29 ml/min (HCC) Obstructive sleep apnea Essential hypertension Paroxysmal atrial fibrillation [facility])      Hospital Course:  Hector [name] is a 57 y.o. male with medical history significant for A. fib on Eliquis, recurrent pancreatitis, sCHF with EF < 20%, CKD-IV, HTN, prior stroke, hypothyroidism, alcohol abuse in remission, prior GI bleed who presents to the ED with mid abdominal pain that awoke him from sleep.  Denies vomiting. Workup significant for lipase 1175 with AST 70, alk phos 161, normal total bilirubin of 1.Creatinine at baseline at 3.12 CBC unremarkable.  BNP 284.  Urinalysis unremarkable.  Abdominal CT scan showing findings of acute pancreatitis in the setting of cholelithiasis with no CT findings of acute cholecystitis.  Patient responded to pain management as well as therapy with resolution of abdominal complaints and therefore being discharged today to follow-up with his primary care physician and outpatient physicians.  Consultants: None Procedures performed: None Disposition: Home Diet recommendation:  Discharge Diet Orders (From admission, onward)     Start     Ordered   09/29/22 0000  Diet - low sodium heart healthy        09/29/22 1206           Cardiac diet DISCHARGE MEDICATION: Allergies as of 09/29/2022       Reactions   Esomeprazole Magnesium Other (See Comments)   Suspected interstitial nephritis 2018   Egg-derived Products Rash        Medication List     STOP taking these medications    potassium chloride  SA 20 MEQ tablet Commonly known as: KLOR-CON M       TAKE these medications    acetaminophen 325 MG tablet Commonly known as: TYLENOL Take 2 tablets (650 mg total) by mouth every 6 (six) hours as needed for mild pain (or Fever >/= 101).   allopurinol 100 MG tablet Commonly known as: ZYLOPRIM Take 0.5 tablets (50 mg total) by mouth daily.   amiodarone 200 MG tablet Commonly known as: PACERONE Take 1 tablet (200 mg total) by mouth daily.   apixaban 5 MG Tabs tablet Commonly known as: ELIQUIS Take 1 tablet (5 mg total) by mouth 2 (two) times daily.   atorvastatin 80 MG tablet Commonly known as: LIPITOR Take 1 tablet (80 mg total) by mouth daily.   empagliflozin 10 MG Tabs tablet Commonly known as: Jardiance Take 1 tablet (10 mg total) by mouth daily.   famotidine 20 MG tablet Commonly known as: PEPCID Take 1 tablet (20 mg total) by mouth daily.   ferrous sulfate 325 (65 FE) MG EC tablet Take 325 mg by mouth daily with breakfast.   isosorbide-hydrALAZINE 20-37.5 MG tablet Commonly known as: BiDil Take 0.5 tablets by mouth 3 (three) times daily. What changed:  how much to take when to take this   levothyroxine 100 MCG tablet Commonly known as: SYNTHROID Take 1 tablet (100 mcg total) by mouth daily before breakfast.   lipase/protease/amylase 40981 UNITS Cpep capsule Commonly known as: CREON Take 1 capsule (36,000 Units total) by mouth 3 (three) times daily with meals.   metolazone 2.5 MG tablet  Commonly known as: ZAROXOLYN Take 1 tablet every Monday with of Potassium.   ondansetron 4 MG disintegrating tablet Commonly known as: ZOFRAN-ODT Take 1 tablet (4 mg total) by mouth every 8 (eight) hours as needed for nausea or vomiting.   torsemide 20 MG tablet Commonly known as: DEMADEX Take 40 mg by mouth daily.        Discharge Exam: Filed Weights   09/25/22 2249  Weight: 111.1 kg    General: Lying in bed in no acute distress HENT:     Head:  Normocephalic and atraumatic.  Cardiovascular:     Rate and Rhythm: Normal rate and regular rhythm.     Heart sounds: Normal heart sounds.  Pulmonary:     Effort: Pulmonary effort is normal.     Breath sounds: Normal breath sounds.  Abdominal: No abdominal tenderness Neurological:     Mental Status: Mental status is at baseline.    Condition at discharge: good    Discharge time spent:  36 minutes.  Signed: Loyce Dys, MD [facility] 09/29/2022

## 2022-11-09 ENCOUNTER — Encounter: Payer: Medicaid Other | Admitting: Internal Medicine

## 2023-01-16 ENCOUNTER — Other Ambulatory Visit: Payer: Self-pay

## 2023-01-16 ENCOUNTER — Inpatient Hospital Stay
Admission: EM | Admit: 2023-01-16 | Discharge: 2023-01-19 | DRG: 391 | Disposition: A | Discharge status: Home / Self Care | Payer: Medicaid Other | Attending: Internal Medicine | Admitting: Internal Medicine

## 2023-01-16 ENCOUNTER — Emergency Department: Payer: Medicaid Other

## 2023-01-16 DIAGNOSIS — Z91148 Patient's other noncompliance with medication regimen for other reason: Secondary | ICD-10-CM

## 2023-01-16 DIAGNOSIS — D751 Secondary polycythemia: Secondary | ICD-10-CM

## 2023-01-16 DIAGNOSIS — I5084 End stage heart failure: Secondary | ICD-10-CM | POA: Diagnosis present

## 2023-01-16 DIAGNOSIS — I426 Alcoholic cardiomyopathy: Secondary | ICD-10-CM | POA: Diagnosis present

## 2023-01-16 DIAGNOSIS — Z8249 Family history of ischemic heart disease and other diseases of the circulatory system: Secondary | ICD-10-CM

## 2023-01-16 DIAGNOSIS — Z79890 Hormone replacement therapy (postmenopausal): Secondary | ICD-10-CM

## 2023-01-16 DIAGNOSIS — E032 Hypothyroidism due to medicaments and other exogenous substances: Secondary | ICD-10-CM

## 2023-01-16 DIAGNOSIS — I4711 Inappropriate sinus tachycardia, so stated: Secondary | ICD-10-CM | POA: Diagnosis not present

## 2023-01-16 DIAGNOSIS — E785 Hyperlipidemia, unspecified: Secondary | ICD-10-CM | POA: Diagnosis present

## 2023-01-16 DIAGNOSIS — Z888 Allergy status to other drugs, medicaments and biological substances status: Secondary | ICD-10-CM

## 2023-01-16 DIAGNOSIS — Z833 Family history of diabetes mellitus: Secondary | ICD-10-CM

## 2023-01-16 DIAGNOSIS — I130 Hypertensive heart and chronic kidney disease with heart failure and stage 1 through stage 4 chronic kidney disease, or unspecified chronic kidney disease: Secondary | ICD-10-CM | POA: Diagnosis present

## 2023-01-16 DIAGNOSIS — Z91012 Allergy to eggs: Secondary | ICD-10-CM

## 2023-01-16 DIAGNOSIS — N2889 Other specified disorders of kidney and ureter: Secondary | ICD-10-CM | POA: Diagnosis present

## 2023-01-16 DIAGNOSIS — K5730 Diverticulosis of large intestine without perforation or abscess without bleeding: Secondary | ICD-10-CM | POA: Diagnosis present

## 2023-01-16 DIAGNOSIS — I5022 Chronic systolic (congestive) heart failure: Secondary | ICD-10-CM | POA: Diagnosis present

## 2023-01-16 DIAGNOSIS — Z8673 Personal history of transient ischemic attack (TIA), and cerebral infarction without residual deficits: Secondary | ICD-10-CM

## 2023-01-16 DIAGNOSIS — I4892 Unspecified atrial flutter: Secondary | ICD-10-CM | POA: Diagnosis present

## 2023-01-16 DIAGNOSIS — Z79899 Other long term (current) drug therapy: Secondary | ICD-10-CM

## 2023-01-16 DIAGNOSIS — G4733 Obstructive sleep apnea (adult) (pediatric): Secondary | ICD-10-CM | POA: Diagnosis present

## 2023-01-16 DIAGNOSIS — I480 Paroxysmal atrial fibrillation: Secondary | ICD-10-CM | POA: Diagnosis present

## 2023-01-16 DIAGNOSIS — R1084 Generalized abdominal pain: Principal | ICD-10-CM | POA: Diagnosis present

## 2023-01-16 DIAGNOSIS — Z7901 Long term (current) use of anticoagulants: Secondary | ICD-10-CM

## 2023-01-16 DIAGNOSIS — I5043 Acute on chronic combined systolic (congestive) and diastolic (congestive) heart failure: Secondary | ICD-10-CM | POA: Diagnosis not present

## 2023-01-16 DIAGNOSIS — N281 Cyst of kidney, acquired: Secondary | ICD-10-CM | POA: Diagnosis present

## 2023-01-16 DIAGNOSIS — Z91199 Patient's noncompliance with other medical treatment and regimen due to unspecified reason: Secondary | ICD-10-CM

## 2023-01-16 DIAGNOSIS — I484 Atypical atrial flutter: Secondary | ICD-10-CM | POA: Diagnosis not present

## 2023-01-16 DIAGNOSIS — I2510 Atherosclerotic heart disease of native coronary artery without angina pectoris: Secondary | ICD-10-CM | POA: Diagnosis present

## 2023-01-16 DIAGNOSIS — I10 Essential (primary) hypertension: Secondary | ICD-10-CM | POA: Diagnosis present

## 2023-01-16 DIAGNOSIS — Z6837 Body mass index (BMI) 37.0-37.9, adult: Secondary | ICD-10-CM

## 2023-01-16 DIAGNOSIS — N179 Acute kidney failure, unspecified: Secondary | ICD-10-CM | POA: Diagnosis present

## 2023-01-16 DIAGNOSIS — F1011 Alcohol abuse, in remission: Secondary | ICD-10-CM | POA: Diagnosis present

## 2023-01-16 DIAGNOSIS — F1091 Alcohol use, unspecified, in remission: Secondary | ICD-10-CM | POA: Diagnosis present

## 2023-01-16 DIAGNOSIS — Z87891 Personal history of nicotine dependence: Secondary | ICD-10-CM

## 2023-01-16 DIAGNOSIS — Z83438 Family history of other disorder of lipoprotein metabolism and other lipidemia: Secondary | ICD-10-CM

## 2023-01-16 DIAGNOSIS — Z7984 Long term (current) use of oral hypoglycemic drugs: Secondary | ICD-10-CM

## 2023-01-16 DIAGNOSIS — N184 Chronic kidney disease, stage 4 (severe): Secondary | ICD-10-CM | POA: Diagnosis present

## 2023-01-16 DIAGNOSIS — R109 Unspecified abdominal pain: Secondary | ICD-10-CM | POA: Diagnosis present

## 2023-01-16 DIAGNOSIS — E669 Obesity, unspecified: Secondary | ICD-10-CM | POA: Diagnosis present

## 2023-01-16 DIAGNOSIS — I447 Left bundle-branch block, unspecified: Secondary | ICD-10-CM | POA: Diagnosis present

## 2023-01-16 DIAGNOSIS — E1129 Type 2 diabetes mellitus with other diabetic kidney complication: Secondary | ICD-10-CM | POA: Diagnosis present

## 2023-01-16 DIAGNOSIS — E039 Hypothyroidism, unspecified: Secondary | ICD-10-CM | POA: Diagnosis present

## 2023-01-16 DIAGNOSIS — D631 Anemia in chronic kidney disease: Secondary | ICD-10-CM | POA: Diagnosis present

## 2023-01-16 DIAGNOSIS — I440 Atrioventricular block, first degree: Secondary | ICD-10-CM | POA: Diagnosis present

## 2023-01-16 DIAGNOSIS — E1122 Type 2 diabetes mellitus with diabetic chronic kidney disease: Secondary | ICD-10-CM | POA: Diagnosis present

## 2023-01-16 LAB — COMPREHENSIVE METABOLIC PANEL
ALT: 18 U/L (ref 0–44)
AST: 25 U/L (ref 15–41)
Albumin: 3.8 g/dL (ref 3.5–5.0)
Alkaline Phosphatase: 114 U/L (ref 38–126)
Anion gap: 14 (ref 5–15)
BUN: 45 mg/dL — ABNORMAL HIGH (ref 6–20)
CO2: 24 mmol/L (ref 22–32)
Calcium: 8.7 mg/dL — ABNORMAL LOW (ref 8.9–10.3)
Chloride: 96 mmol/L — ABNORMAL LOW (ref 98–111)
Creatinine, Ser: 4.04 mg/dL — ABNORMAL HIGH (ref 0.61–1.24)
GFR, Estimated: 16 mL/min — ABNORMAL LOW (ref >60)
Glucose, Bld: 259 mg/dL — ABNORMAL HIGH (ref 70–99)
Potassium: 3.9 mmol/L (ref 3.5–5.1)
Sodium: 134 mmol/L — ABNORMAL LOW (ref 135–145)
Total Bilirubin: 0.9 mg/dL (ref <1.2)
Total Protein: 7.8 g/dL (ref 6.5–8.1)

## 2023-01-16 LAB — CBC WITH DIFFERENTIAL/PLATELET
Abs Immature Granulocytes: 0.08 10*3/uL — ABNORMAL HIGH (ref 0.00–0.07)
Basophils Absolute: 0.1 10*3/uL (ref 0.0–0.1)
Basophils Relative: 1 %
Eosinophils Absolute: 0.2 10*3/uL (ref 0.0–0.5)
Eosinophils Relative: 2 %
HCT: 53.2 % — ABNORMAL HIGH (ref 39.0–52.0)
Hemoglobin: 17.3 g/dL — ABNORMAL HIGH (ref 13.0–17.0)
Immature Granulocytes: 1 %
Lymphocytes Relative: 20 %
Lymphs Abs: 1.9 10*3/uL (ref 0.7–4.0)
MCH: 27.9 pg (ref 26.0–34.0)
MCHC: 32.5 g/dL (ref 30.0–36.0)
MCV: 85.8 fL (ref 80.0–100.0)
Monocytes Absolute: 0.7 10*3/uL (ref 0.1–1.0)
Monocytes Relative: 8 %
Neutro Abs: 6.3 10*3/uL (ref 1.7–7.7)
Neutrophils Relative %: 68 %
Platelets: 295 10*3/uL (ref 150–400)
RBC: 6.2 MIL/uL — ABNORMAL HIGH (ref 4.22–5.81)
RDW: 14.7 % (ref 11.5–15.5)
WBC: 9.3 10*3/uL (ref 4.0–10.5)
nRBC: 0 % (ref 0.0–0.2)

## 2023-01-16 LAB — URINE DRUG SCREEN, QUALITATIVE (ARMC ONLY)
Amphetamines, Ur Screen: NOT DETECTED
Barbiturates, Ur Screen: NOT DETECTED
Benzodiazepine, Ur Scrn: NOT DETECTED
Cannabinoid 50 Ng, Ur ~~LOC~~: NOT DETECTED
Cocaine Metabolite,Ur ~~LOC~~: NOT DETECTED
MDMA (Ecstasy)Ur Screen: NOT DETECTED
Methadone Scn, Ur: NOT DETECTED
Opiate, Ur Screen: NOT DETECTED
Phencyclidine (PCP) Ur S: NOT DETECTED
Tricyclic, Ur Screen: NOT DETECTED

## 2023-01-16 LAB — T4, FREE: Free T4: 0.97 ng/dL (ref 0.61–1.12)

## 2023-01-16 LAB — TSH: TSH: 23.233 u[IU]/mL — ABNORMAL HIGH (ref 0.350–4.500)

## 2023-01-16 LAB — TROPONIN I (HIGH SENSITIVITY)
Troponin I (High Sensitivity): 99 ng/L — ABNORMAL HIGH (ref <18)
Troponin I (High Sensitivity): 96 ng/L — ABNORMAL HIGH (ref <18)

## 2023-01-16 LAB — LIPASE, BLOOD: Lipase: 29 U/L (ref 11–51)

## 2023-01-16 LAB — BRAIN NATRIURETIC PEPTIDE: B Natriuretic Peptide: 358.6 pg/mL — ABNORMAL HIGH (ref 0.0–100.0)

## 2023-01-16 MED ORDER — ACETAMINOPHEN 325 MG PO TABS: 650.0000 mg | ORAL_TABLET | Freq: Four times a day (QID) | ORAL | Status: DC | PRN

## 2023-01-16 MED ORDER — EMPAGLIFLOZIN 10 MG PO TABS
10.0000 mg | ORAL_TABLET | Freq: Every day | ORAL | Status: DC
  Administered 2023-01-17 – 2023-01-19 (×3): 10 mg via ORAL
  Filled 2023-01-16 (×3): qty 1

## 2023-01-16 MED ORDER — ISOSORB DINITRATE-HYDRALAZINE 20-37.5 MG PO TABS
1.0000 | ORAL_TABLET | Freq: Two times a day (BID) | ORAL | Status: DC
  Administered 2023-01-16 – 2023-01-18 (×4): 1 via ORAL
  Filled 2023-01-16 (×4): qty 1

## 2023-01-16 MED ORDER — ONDANSETRON HCL 4 MG PO TABS
4.0000 mg | ORAL_TABLET | Freq: Four times a day (QID) | ORAL | Status: DC | PRN
  Administered 2023-01-17: 4 mg via ORAL
  Filled 2023-01-16: qty 1

## 2023-01-16 MED ORDER — ONDANSETRON HCL 4 MG/2ML IJ SOLN
4.0000 mg | Freq: Four times a day (QID) | INTRAMUSCULAR | Status: DC | PRN
  Administered 2023-01-18: 4 mg via INTRAVENOUS

## 2023-01-16 MED ORDER — ATORVASTATIN CALCIUM 80 MG PO TABS
80.0000 mg | ORAL_TABLET | Freq: Every day | ORAL | Status: DC
  Administered 2023-01-16 – 2023-01-19 (×4): 80 mg via ORAL
  Filled 2023-01-16: qty 4
  Filled 2023-01-16: qty 1
  Filled 2023-01-16: qty 4
  Filled 2023-01-16: qty 1

## 2023-01-16 MED ORDER — ALLOPURINOL 100 MG PO TABS
50.0000 mg | ORAL_TABLET | Freq: Every day | ORAL | Status: DC
  Administered 2023-01-17 – 2023-01-19 (×3): 50 mg via ORAL
  Filled 2023-01-16 (×2): qty 1
  Filled 2023-01-16: qty 0.5

## 2023-01-16 MED ORDER — OXYCODONE-ACETAMINOPHEN 5-325 MG PO TABS
1.0000 | ORAL_TABLET | Freq: Once | ORAL | Status: AC
  Administered 2023-01-16: 1 via ORAL
  Filled 2023-01-16: qty 1

## 2023-01-16 MED ORDER — ACETAMINOPHEN 650 MG RE SUPP: 650.0000 mg | Freq: Four times a day (QID) | RECTAL | Status: DC | PRN

## 2023-01-16 MED ORDER — FERROUS SULFATE 325 (65 FE) MG PO TABS
325.0000 mg | ORAL_TABLET | Freq: Every day | ORAL | Status: DC
  Administered 2023-01-17 – 2023-01-19 (×3): 325 mg via ORAL
  Filled 2023-01-16 (×3): qty 1

## 2023-01-16 MED ORDER — SODIUM CHLORIDE 0.9% FLUSH
3.0000 mL | Freq: Two times a day (BID) | INTRAVENOUS | Status: DC
  Administered 2023-01-17 – 2023-01-19 (×5): 3 mL via INTRAVENOUS

## 2023-01-16 MED ORDER — TORSEMIDE 20 MG PO TABS: 40.0000 mg | ORAL_TABLET | Freq: Every day | ORAL | Status: DC

## 2023-01-16 MED ORDER — AMIODARONE HCL 200 MG PO TABS: 200.0000 mg | ORAL_TABLET | Freq: Every day | ORAL | Status: DC

## 2023-01-16 MED ORDER — POLYETHYLENE GLYCOL 3350 17 G PO PACK: 17.0000 g | PACK | Freq: Every day | ORAL | Status: DC | PRN

## 2023-01-16 MED ORDER — LEVOTHYROXINE SODIUM 100 MCG PO TABS
100.0000 ug | ORAL_TABLET | Freq: Every day | ORAL | Status: DC
  Administered 2023-01-17: 100 ug via ORAL
  Filled 2023-01-16: qty 1

## 2023-01-16 MED ORDER — MELATONIN 5 MG PO TABS: 5.0000 mg | ORAL_TABLET | Freq: Once | ORAL | Status: DC

## 2023-01-16 MED ORDER — SODIUM CHLORIDE 0.9 % IV BOLUS
250.0000 mL | Freq: Once | INTRAVENOUS | Status: AC
  Administered 2023-01-16: 250 mL via INTRAVENOUS

## 2023-01-16 MED ORDER — APIXABAN 5 MG PO TABS
5.0000 mg | ORAL_TABLET | Freq: Two times a day (BID) | ORAL | Status: DC
  Administered 2023-01-16 – 2023-01-19 (×6): 5 mg via ORAL
  Filled 2023-01-16 (×6): qty 1

## 2023-01-16 MED ORDER — AMIODARONE HCL 200 MG PO TABS
200.0000 mg | ORAL_TABLET | Freq: Every day | ORAL | Status: DC
  Administered 2023-01-16: 200 mg via ORAL
  Filled 2023-01-16: qty 1

## 2023-01-16 NOTE — ED Provider Notes (Signed)
[facility] Provider Note    Event Date/Time   First MD Initiated Contact with Patient 01/16/23 1111     (approximate)   History   Abdominal Pain   HPI  Hector [name] is a 57 y.o. 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  Denies any shortness of breath no orthopnea.  No chest pain at this time.     Physical Exam   Triage Vital Signs: ED Triage Vitals  Encounter Vitals Group     BP 01/16/23 1045 (!) 137/94     Systolic BP Percentile --      Diastolic BP Percentile --      Pulse Rate 01/16/23 1045 (!) 56     Resp 01/16/23 1045 17     Temp 01/16/23 1045 98.9 F (37.2 C)     Temp Source 01/16/23 1045 Oral     SpO2 01/16/23 1130 95 %     Weight 01/16/23 1046 245 lb (111.1 kg)     Height 01/16/23 1046 5\' 11"  (1.803 m)     Head Circumference --      Peak Flow --      Pain Score 01/16/23 1045 4     Pain Loc --      Pain Education --      Exclude from Growth Chart --     Most recent vital signs: Vitals:   01/16/23 1400 01/16/23 1545  BP: (!) 126/93   Pulse: (!) 54   Resp: (!) 22   Temp:  98.7 F (37.1 C)  SpO2: 93%      Constitutional: Alert  Eyes: Conjunctivae are normal.  Head: Atraumatic. Nose: No congestion/rhinnorhea. Mouth/Throat: Mucous membranes are moist.   Neck: Painless ROM.  Cardiovascular:   Good peripheral circulation. Respiratory: Normal respiratory effort.  No retractions.  Gastrointestinal: Soft and nontender.  Musculoskeletal:  no deformity Neurologic:  MAE spontaneously. No gross focal neurologic deficits are appreciated.  Skin:  Skin is warm, dry and intact. No rash noted. Psychiatric: Mood and affect are normal. Speech and behavior are  normal.    ED Results / Procedures / Treatments   Labs (all labs ordered are listed, but only abnormal results are displayed) Labs Reviewed  CBC WITH DIFFERENTIAL/PLATELET - Abnormal; Notable for the following components:      Result Value   RBC 6.20 (*)    Hemoglobin 17.3 (*)    HCT 53.2 (*)    Abs Immature Granulocytes 0.08 (*)    All other components within normal limits  COMPREHENSIVE METABOLIC PANEL - Abnormal; Notable for the following components:   Sodium 134 (*)    Chloride 96 (*)    Glucose, Bld 259 (*)    BUN 45 (*)    Creatinine, Ser 4.04 (*)    Calcium 8.7 (*)    GFR, Estimated 16 (*)    All other components within normal limits  BRAIN NATRIURETIC PEPTIDE - Abnormal; Notable for the following components:   B Natriuretic Peptide 358.6 (*)    All other components within normal limits  TROPONIN I (HIGH SENSITIVITY) - Abnormal; Notable for the following components:   Troponin I (High Sensitivity) 99 (*)    All other components within normal limits  TROPONIN I (HIGH SENSITIVITY) - Abnormal; Notable for  the following components:   Troponin I (High Sensitivity) 96 (*)    All other components within normal limits  LIPASE, BLOOD     EKG  ED ECG REPORT I, Willy Eddy, the attending physician, personally viewed and interpreted this ECG.   Date: 01/16/2023  EKG Time: 15:43  Rate: 115  Rhythm: sinus  Axis: left  Intervals: lbbb  ST&T Change: no sgarbossa, no depressions    RADIOLOGY Please see ED Course for my review and interpretation.  I personally reviewed all radiographic images ordered to evaluate for the above acute complaints and reviewed radiology reports and findings.  These findings were personally discussed with the patient.  Please see medical record for radiology report.    PROCEDURES:  Critical Care performed: No  Procedures   MEDICATIONS ORDERED IN ED: Medications  amiodarone (PACERONE) tablet 200 mg (200 mg Oral Given 01/16/23  1440)  oxyCODONE-acetaminophen (PERCOCET/ROXICET) 5-325 MG per tablet 1 tablet (1 tablet Oral Given 01/16/23 1440)  sodium chloride 0.9 % bolus 250 mL (250 mLs Intravenous New Bag/Given 01/16/23 1544)     IMPRESSION / MDM / ASSESSMENT AND PLAN / ED COURSE  I reviewed the triage vital signs and the nursing notes.                              Differential diagnosis includes, but is not limited to, anasarca, CHF, SBO, pancreatitis, biliary pathology, electrolyte abnormality, aki,   Patient presenting to the ER for evaluation of symptoms as described above.  Based on symptoms, risk factors and considered above differential, this presenting complaint could reflect a potentially life-threatening illness therefore the patient will be placed on continuous pulse oximetry and telemetry for monitoring.  Laboratory evaluation will be sent to evaluate for the above complaints.      Clinical Course as of 01/16/23 1549  Sat Jan 16, 2023  1227 DG Chest Portable 1 View [PR]  1431 Troponin repeated and stable.  Is mildly increased but likely at its baseline given his worsening renal function.  He denies any chest pain or pressure at this time.  Does not have any hypoxia. [PR]  1530 Reassessed.  He adamantly denies any chest pain or shortness of breath.  His heart rate is still mildly elevated but he does not feel any weakness shortness of breath or discomfort with ambulation.  Given additional dose of his amiodarone.  Based on his blood work I do wonder if he is mildly over diuresed because his BNP is actually improved and renal function has worsened from prior after aggressive diuresis will give small bolus of IV fluid.  Given elevation of troponin AKI comorbidities patient does not feeland will consult hospitalist for admission. [PR]    Clinical Course User Index [PR] Willy Eddy, MD     FINAL CLINICAL IMPRESSION(S) / ED DIAGNOSES   Final diagnoses:  Generalized abdominal pain  AKI (acute  kidney injury) (HCC)     Rx / DC Orders   ED Discharge Orders     None        Note:  This document was prepared using Dragon voice recognition software and may include unintentional dictation errors.    Willy Eddy, MD 01/16/23 1550

## 2023-01-16 NOTE — ED Triage Notes (Signed)
First Nurse Note:  Pt via [facility] from home. Pt c/o abd pain and fluid build-up in his stomach for the past three days. Pt does have a hx of CHF. Denies liver issues, same happened in Feb per pt. Denies SOB. Pt is A&Ox4 and NAD 134/90 BP 58 HR  97% on RA

## 2023-01-16 NOTE — H&P (Addendum)
History and Physical    Patient: Hector [name] [id number] DOB: 1965-07-06 DOA: 01/16/2023 DOS: the patient was seen and examined on 01/16/2023 PCP: Oswaldo Conroy, MD  Patient coming from: Home  Chief Complaint:  Chief Complaint  Patient presents with   Abdominal Pain   HPI: Hector [name] is a 57 y.o. 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  Hector [name] states that he came to the ED today due to abdominal pain that is periumbilical without radiation.  He states that the pain has been occurring for nearly 3 days.  Otherwise, he denies any new shortness of breath, cough, chest pain, palpitations, nausea, vomiting or lower extremity swelling.  He has been taking his diuretics as instructed without missing any doses.  He notes that he has been taking Synthroid as instructed as well.  He has not been wearing his CPAP though.  ED course: On arrival to the ED, patient was normotensive at 129/92 with heart rate of 113.  He was saturating at 95% on room air.  He was afebrile 98.9.Initial workup notable for WBC of 9.3, hemoglobin of 17.3, sodium of 134, bicarb 24, glucose 259, BUN 45, creatinine 4.04 with GFR of 16.  BNP elevated at 358.  Troponin 99 with flat trend to 96.  UDS negative.  TSH 23.  CT of the abdomen with no acute abnormalities.  Chest x-ray with no acute cardiopulmonary disease.  Due to persistent sinus tachycardia with possible AKI, [facility] contacted for admission.  Review of Systems: As mentioned in the history of present illness. All other systems reviewed and are negative.  Past Medical History:  Diagnosis Date   Alcohol abuse     Alcoholic cardiomyopathy (HCC)    a. 12/2007 MV: EF 28%, no isch;  b. 8/12 Echo: EF 25-35%; c. 02/2014 Echo: EF 20-25%; d. 12/2014 Cath: minimal CAD; e. 01/2015 Echo: EF 50-55%;  d. 05/2016 Echo: EF 30-35%, diff HK, gr2 DD; e. 11/2016 Echo: EF 45-50%, diff HK; f. 09/2021 Echo: EF <20%; g. 11/2021 Echo: EF<20%.   Chronic combined systolic (congestive) and diastolic (congestive) heart failure (HCC)    a. 05/2016 Echo: EF 30-35%; b. 11/2016 Echo: EF 45-50%, diff HK; c. 09/2021 Echo: EF < 20%; d. 11/2021 Echo: EF <20%, no rwma, Nl RV fxn, sev dil LA, mod-sev MR. mod dil PA w/ mildly elev PASP.   CKD (chronic kidney disease), stage III (HCC)    Elevated troponin (chronic)    Essential hypertension    GI bleed 11/2013   Hyperlipidemia    Pancreatitis    Paroxysmal A-fib (HCC)    a. new onset s/p unsuccessful TEE/DCCV on 08/16/2014; b. CHA2DS2VASc = 3-> eliquis (freq noncompliant); c. 02/2021 s/p TEE/DCCV; d. Prev on amio->d/c 2/2 abnl TFTs.   Paroxysmal atrial flutter (HCC)    Sleep-disordered breathing    Has yet to have a sleep study   Stroke [facility])    Past Surgical History:  Procedure Laterality Date   CARDIAC CATHETERIZATION N/A 01/09/2015   Procedure: Left Heart Cath and Coronary Angiography;  Surgeon: Marykay Lex, MD;  Location: [facility] INVASIVE CV LAB;  Service: Cardiovascular;  Laterality: N/A;   CARDIOVERSION N/A 03/05/2021   Procedure: CARDIOVERSION;  Surgeon:  Debbe Odea, MD;  Location: [facility] ORS;  Service: Cardiovascular;  Laterality: N/A;   CENTRAL LINE INSERTION  04/28/2022   Procedure: CENTRAL LINE INSERTION;  Surgeon: Yvonne Kendall, MD;  Location: [facility] INVASIVE CV LAB;  Service: Cardiovascular;;   COLONOSCOPY N/A 07/22/2020   Procedure: COLONOSCOPY;  Surgeon: Toledo, Boykin Nearing, MD;  Location: [facility] ENDOSCOPY;  Service: Gastroenterology;  Laterality: N/A;   ELECTROPHYSIOLOGIC STUDY N/A 08/16/2014   Procedure: CARDIOVERSION;  Surgeon: Antonieta Iba, MD;  Location: [facility] ORS;   Service: Cardiovascular;  Laterality: N/A;   ESOPHAGOGASTRODUODENOSCOPY (EGD) WITH PROPOFOL N/A 05/04/2022   Procedure: ESOPHAGOGASTRODUODENOSCOPY (EGD) WITH PROPOFOL;  Surgeon: Toney Reil, MD;  Location: [facility] ENDOSCOPY;  Service: Gastroenterology;  Laterality: N/A;   FLEXIBLE SIGMOIDOSCOPY N/A 10/10/2015   Procedure: FLEXIBLE SIGMOIDOSCOPY;  Surgeon: Christena Deem, MD;  Location: [facility] ENDOSCOPY;  Service: Endoscopy;  Laterality: N/A;   KNEE SURGERY Right    NM MYOVIEW LTD  November 2011   No ischemia or infarction. EF 50-55% ( no improvement from 2009 Myoview EF of 28%   RIGHT HEART CATH N/A 04/28/2022   Procedure: RIGHT HEART CATH;  Surgeon: Yvonne Kendall, MD;  Location: [facility] INVASIVE CV LAB;  Service: Cardiovascular;  Laterality: N/A;   TEE WITHOUT CARDIOVERSION N/A 08/16/2014   Procedure: TRANSESOPHAGEAL ECHOCARDIOGRAM (TEE);  Surgeon: Antonieta Iba, MD;  Location: [facility] ORS;  Service: Cardiovascular;  Laterality: N/A;   TEE WITHOUT CARDIOVERSION N/A 03/05/2021   Procedure: TRANSESOPHAGEAL ECHOCARDIOGRAM (TEE);  Surgeon: Debbe Odea, MD;  Location: [facility] ORS;  Service: Cardiovascular;  Laterality: N/A;   TEE WITHOUT CARDIOVERSION N/A 05/01/2022   Procedure: TRANSESOPHAGEAL ECHOCARDIOGRAM;  Surgeon: Laurey Morale, MD;  Location: [facility] ORS;  Service: Cardiovascular;  Laterality: N/A;  TEE with cardioversion   Social History:  reports that he quit smoking about 24 years ago. His smoking use included cigarettes. He started smoking about 36 years ago. He has a 12 pack-year smoking history. He has never used smokeless tobacco. He reports that he does not currently use alcohol. He reports that he does not use drugs.  Allergies  Allergen Reactions   Esomeprazole Magnesium Other (See Comments)    Suspected interstitial nephritis 2018   Egg-Derived Products Rash    Family History  Problem Relation Age of Onset   Hypertension Mother    Hyperlipidemia Mother    Diabetes Mother      Prior to Admission medications   Medication Sig Start Date End Date Taking? Authorizing Provider  acetaminophen (TYLENOL) 325 MG tablet Take 2 tablets (650 mg total) by mouth every 6 (six) hours as needed for mild pain (or Fever >/= 101). 09/29/22   Loyce Dys, MD  allopurinol (ZYLOPRIM) 100 MG tablet Take 0.5 tablets (50 mg total) by mouth daily. 10/03/21   Danford, Earl Lites, MD  amiodarone (PACERONE) 200 MG tablet Take 1 tablet (200 mg total) by mouth daily. 06/29/22   Bensimhon, Bevelyn Buckles, MD  apixaban (ELIQUIS) 5 MG TABS tablet Take 1 tablet (5 mg total) by mouth 2 (two) times daily. 10/02/21   Danford, Earl Lites, MD  atorvastatin (LIPITOR) 80 MG tablet Take 1 tablet (80 mg total) by mouth daily. 11/24/21   Hollice Espy, MD  empagliflozin (JARDIANCE) 10 MG TABS tablet Take 1 tablet (10 mg total) by mouth daily. 05/26/22   Delma Freeze, FNP  famotidine (PEPCID) 20 MG tablet Take 1 tablet (20 mg total) by mouth daily. Patient not taking: Reported on 09/29/2022 05/05/22   Arnetha Courser,  MD  ferrous sulfate 325 (65 FE) MG EC tablet Take 325 mg by mouth daily with breakfast.    [provider]  isosorbide-hydrALAZINE (BIDIL) 20-37.5 MG tablet Take 0.5 tablets by mouth 3 (three) times daily. Patient taking differently: Take 1 tablet by mouth 2 (two) times daily. 06/17/22   Bensimhon, Bevelyn Buckles, MD  levothyroxine (SYNTHROID) 100 MCG tablet Take 1 tablet (100 mcg total) by mouth daily before breakfast. Patient not taking: Reported on 07/17/2022 11/24/21   Hollice Espy, MD  lipase/protease/amylase (CREON) 36000 UNITS CPEP capsule Take 1 capsule (36,000 Units total) by mouth 3 (three) times daily with meals. 05/04/22   Arnetha Courser, MD  metolazone (ZAROXOLYN) 2.5 MG tablet Take 1 tablet every Monday with of Potassium. 06/29/22   Bensimhon, Bevelyn Buckles, MD  ondansetron (ZOFRAN-ODT) 4 MG disintegrating tablet Take 1 tablet (4 mg total) by mouth every 8 (eight) hours as needed for  nausea or vomiting. 09/20/22   Minna Antis, MD  torsemide (DEMADEX) 20 MG tablet Take 40 mg by mouth daily.    [provider]    Physical Exam: Vitals:   01/16/23 1130 01/16/23 1230 01/16/23 1400 01/16/23 1545  BP: (!) 129/92 (!) 126/103 (!) 126/93   Pulse: (!) 113 (!) 55 (!) 54   Resp: (!) 24 (!) 26 (!) 22   Temp:    98.7 F (37.1 C)  TempSrc:    Oral  SpO2: 95% 93% 93%   Weight:      Height:       Physical Exam Vitals and nursing note reviewed.  Constitutional:      General: He is not in acute distress.    Appearance: He is obese.  HENT:     Head: Normocephalic and atraumatic.     Mouth/Throat:     Mouth: Mucous membranes are moist.     Pharynx: Oropharynx is clear.  Cardiovascular:     Rate and Rhythm: Regular rhythm. Tachycardia present.     Heart sounds: No murmur heard. Pulmonary:     Effort: Pulmonary effort is normal. No respiratory distress.     Breath sounds: Normal breath sounds. No wheezing, rhonchi or rales.  Abdominal:     General: Bowel sounds are normal. There is no distension.     Tenderness: There is abdominal tenderness (Minimal) in the periumbilical area.     Hernia: No hernia is present.  Musculoskeletal:     Right lower leg: No edema.     Left lower leg: No edema.  Skin:    General: Skin is warm and dry.  Neurological:     General: No focal deficit present.     Mental Status: He is alert and oriented to person, place, and time.  Psychiatric:        Mood and Affect: Mood normal.        Behavior: Behavior normal.    Data Reviewed: CBC with WBC of 9.3, hemoglobin of 17.3, platelets of 295 CMP with sodium of 134, potassium 3.9, bicarb 24, glucose 259, BUN 45, creatinine 4.05, and GFR of 16 BNP 358 Troponin 99 with flat trend at 96 TSH 23.23 UDS negative  EKG and telemetry personally reviewed.  Sinus rhythm with a rate ranging between 10 5-1 15.  First-degree AV block.  With left bundle branch block.  Compared to previous  EKGs.  No changes noted with the exception of rate increase.  CT ABDOMEN PELVIS WO CONTRAST  Result Date: 01/16/2023 CLINICAL DATA:  Abdominal pain.  EXAM: CT ABDOMEN AND PELVIS WITHOUT CONTRAST TECHNIQUE: Multidetector CT imaging of the abdomen and pelvis was performed following the standard protocol without IV contrast. RADIATION DOSE REDUCTION: This exam was performed according to the departmental dose-optimization program which includes automated exposure control, adjustment of the mA and/or kV according to patient size and/or use of iterative reconstruction technique. COMPARISON:  09/26/2022 FINDINGS: Lower chest: 5 mm right lower lobe pulmonary nodule on image 5/series 4 is stable comparing back to a study from 05/06/2021 consistent with benign etiology. No followup imaging is recommended. Hepatobiliary: No suspicious focal abnormality in the liver on this study without intravenous contrast. Tiny calcified gallstones evident. No intrahepatic or extrahepatic biliary dilation. Pancreas: No focal mass lesion. No dilatation of the main duct. No intraparenchymal cyst. No peripancreatic edema. Spleen: No splenomegaly. No suspicious focal mass lesion. Adrenals/Urinary Tract: No adrenal nodule or mass. Bilateral renal cysts are stable. 12 mm exophytic lesion posterior upper pole right kidney (33/2) has attenuation too high to be a simple cyst. This finding measured 16 mm on 05/06/2021 and 16 mm on 06/19/2020 consistent with benign etiology with some involution and probably complicated now by proteinaceous debris or hemorrhage. Similar 19 mm posterior exophytic interpolar left renal lesion was 2.3 cm on 06/19/2020 and is stable since 09/26/2022. Kidneys were evaluated by MRI on 09/26/2022 documenting bilateral Bosniak I and II cysts in both kidneys without suspicious renal mass lesion evident. No evidence for hydroureter. The urinary bladder appears normal for the degree of distention. Stomach/Bowel: Stomach is  unremarkable. No gastric wall thickening. No evidence of outlet obstruction. Duodenum is normally positioned as is the ligament of Treitz. No small bowel wall thickening. No small bowel dilatation. The terminal ileum is normal. The appendix is normal. Diverticuli are seen scattered along the entire length of the colon without CT findings of diverticulitis. Vascular/Lymphatic: There is mild atherosclerotic calcification of the abdominal aorta without aneurysm. There is no gastrohepatic or hepatoduodenal ligament lymphadenopathy. No retroperitoneal or mesenteric lymphadenopathy. No pelvic sidewall lymphadenopathy. Reproductive: The prostate gland and seminal vesicles are unremarkable. Other: No intraperitoneal free fluid. Musculoskeletal: No worrisome lytic or sclerotic osseous abnormality. IMPRESSION: 1. No acute findings in the abdomen or pelvis. Specifically, no findings to explain the patient's history of abdominal pain. 2. Cholelithiasis. 3. Colonic diverticulosis without diverticulitis. 4. Bosniak I and II renal cysts as characterized by MRI 09/26/2022. 5.  Aortic Atherosclerosis (ICD10-I70.0). Electronically Signed   By: Kennith Center M.D.   On: 01/16/2023 15:10   DG Chest Portable 1 View  Result Date: 01/16/2023 CLINICAL DATA:  57 year old male with shortness of breath. Abdominal pain. "Fluid buildup". EXAM: PORTABLE CHEST 1 VIEW COMPARISON:  Chest radiographs 05/18/2022 and earlier. FINDINGS: Portable AP view at elevn 51 hours. Cardiomegaly is less apparent. Other mediastinal contours are within normal limits. Visualized tracheal air column is within normal limits. Regressed pulmonary edema/vascular congestion. No edema now. Mildly improved lung volumes. Eventration of the diaphragm, normal variant. No pneumothorax, pleural effusion, confluent lung opacity. No acute osseous abnormality identified. No bowel gas in the visible abdomen. IMPRESSION: No acute cardiopulmonary abnormality. Resolved pulmonary  edema seen in March. Electronically Signed   By: Odessa Fleming M.D.   On: 01/16/2023 11:57    Results are pending, will review when available.  Assessment and Plan:  * Inappropriate sinus tachycardia (HCC) Patient is presenting with heart rate ranging between 105 and 115.  Unclear etiology at this time as he does not appear hyper or hypovolemic; creatinine is slightly higher  than his usual, however GFR is only 4 points lower than usual.  No ischemic appearing changes and patient declines any chest pain; elevated troponin is likely due to CKD.  TSH is elevated, however I would expect this to present as bradycardia.  Patient does have a history of atrial flutter and I considered the possibility that this may be 2-1 AV block, however elevated rate makes this hard to assess.  No prior history of sinus tachycardia.  - Telemetry monitoring - Per previous cardiology notes, no beta-blocker due to low output heart failure - S/p 250 cc bolus in the ED; hold further IV fluids - Free T4 pending - Restart home CPAP as patient has been noncompliant - If no improvement or resolution in the next 24 hours, could consider cardiology consultation  Chronic renal impairment, stage 4 (severe) (HCC) Patient's creatinine generally ranges between 3.3-3.5, however has been as high as 4.4 at his previous follow-ups with [facility] nephrology.  Baseline GFR around 20, currently 6.  Overall, not quite consistent with an AKI.  - Recheck BMP in the a.m.  Hypothyroidism Patient reports compliance with Synthroid, however TSH is elevated.  This may be due to lack of education regarding administration.  - Free T4 pending - Pharmacy consultation for education regarding Synthroid use - Restart home Synthroid  Chronic systolic CHF (congestive heart failure) (HCC) Patient has a history of HFrEF with last EF less than 20% in the setting of alcohol induced nonischemic cardiomyopathy.  He appears euvolemic at this time and reports compliance  with his home diuretics.  - Continue home GDMT and torsemide  Polycythemia Elevated hemoglobin of 17.3, which is surprising as I would expect anemia in light of CKD stage IV.  Suspected to be due to reported noncompliance with CPAP.  - Recheck CBC in the a.m.  OSA (obstructive sleep apnea) - Restart home CPAP  Type II diabetes mellitus with renal manifestations (HCC) - A1c pending - Continue home Jardiance - SSI, moderate  Essential hypertension Blood pressure within goal.  - Continue home regimen  Alcohol use, unspecified, in remission Patient reports he has been in remission for nearly 13 years at this time.  Advance Care Planning:   Code Status: Full Code verified by patient.  Consults: None  Family Communication: No family at bedside  Severity of Illness: The appropriate patient status for this patient is OBSERVATION. Observation status is judged to be reasonable and necessary in order to provide the required intensity of service to ensure the patient's safety. The patient's presenting symptoms, physical exam findings, and initial radiographic and laboratory data in the context of their medical condition is felt to place them at decreased risk for further clinical deterioration. Furthermore, it is anticipated that the patient will be medically stable for discharge from the hospital within 2 midnights of admission.   Author: Verdene Lennert, MD 01/16/2023 5:00 PM  For on call review [email or web address].

## 2023-01-16 NOTE — Assessment & Plan Note (Signed)
Blood pressure within goal.  - Continue home regimen

## 2023-01-16 NOTE — Assessment & Plan Note (Signed)
Restart home CPAP

## 2023-01-16 NOTE — Assessment & Plan Note (Signed)
Patient is presenting with heart rate ranging between 105 and 115.  Unclear etiology at this time as he does not appear hyper or hypovolemic; creatinine is slightly higher than his usual, however GFR is only 4 points lower than usual.  No ischemic appearing changes and patient declines any chest pain; elevated troponin is likely due to CKD.  TSH is elevated, however I would expect this to present as bradycardia.  Patient does have a history of atrial flutter and I considered the possibility that this may be 2-1 AV block, however elevated rate makes this hard to assess.  No prior history of sinus tachycardia.  - Telemetry monitoring - Per previous cardiology notes, no beta-blocker due to low output heart failure - S/p 250 cc bolus in the ED; hold further IV fluids - Free T4 pending - Restart home CPAP as patient has been noncompliant - If no improvement or resolution in the next 24 hours, could consider cardiology consultation

## 2023-01-16 NOTE — Assessment & Plan Note (Signed)
Today's creatinine 4.52.  Hold water pill again today.  Reassess tomorrow.

## 2023-01-16 NOTE — Assessment & Plan Note (Signed)
Patient has a history of HFrEF with last EF less than 20% in the setting of alcohol induced nonischemic cardiomyopathy.  He appears euvolemic at this time and reports compliance with his home diuretics.  - Continue home GDMT and torsemide

## 2023-01-16 NOTE — Assessment & Plan Note (Signed)
Patient reports he has been in remission for nearly 13 years at this time.

## 2023-01-16 NOTE — Assessment & Plan Note (Signed)
With TSH being 23, continue increased levothyroxine.

## 2023-01-16 NOTE — Assessment & Plan Note (Signed)
Likely from sleep apnea.

## 2023-01-16 NOTE — ED Triage Notes (Signed)
Pt c/o of fluid build-up in his stomach. States the same thing happened in Feb - he had to be admitted for it to be drained off.

## 2023-01-16 NOTE — Assessment & Plan Note (Signed)
-   A1c pending - Continue home Jardiance - SSI, moderate

## 2023-01-17 DIAGNOSIS — Z7901 Long term (current) use of anticoagulants: Secondary | ICD-10-CM | POA: Diagnosis not present

## 2023-01-17 DIAGNOSIS — Z79890 Hormone replacement therapy (postmenopausal): Secondary | ICD-10-CM | POA: Diagnosis not present

## 2023-01-17 DIAGNOSIS — Z79899 Other long term (current) drug therapy: Secondary | ICD-10-CM | POA: Diagnosis not present

## 2023-01-17 DIAGNOSIS — I10 Essential (primary) hypertension: Secondary | ICD-10-CM

## 2023-01-17 DIAGNOSIS — I4892 Unspecified atrial flutter: Secondary | ICD-10-CM | POA: Diagnosis not present

## 2023-01-17 DIAGNOSIS — I130 Hypertensive heart and chronic kidney disease with heart failure and stage 1 through stage 4 chronic kidney disease, or unspecified chronic kidney disease: Secondary | ICD-10-CM | POA: Diagnosis present

## 2023-01-17 DIAGNOSIS — I426 Alcoholic cardiomyopathy: Secondary | ICD-10-CM | POA: Diagnosis present

## 2023-01-17 DIAGNOSIS — Z6837 Body mass index (BMI) 37.0-37.9, adult: Secondary | ICD-10-CM | POA: Diagnosis not present

## 2023-01-17 DIAGNOSIS — N184 Chronic kidney disease, stage 4 (severe): Secondary | ICD-10-CM | POA: Diagnosis present

## 2023-01-17 DIAGNOSIS — Z87891 Personal history of nicotine dependence: Secondary | ICD-10-CM | POA: Diagnosis not present

## 2023-01-17 DIAGNOSIS — I5084 End stage heart failure: Secondary | ICD-10-CM | POA: Diagnosis present

## 2023-01-17 DIAGNOSIS — I5043 Acute on chronic combined systolic (congestive) and diastolic (congestive) heart failure: Secondary | ICD-10-CM | POA: Diagnosis not present

## 2023-01-17 DIAGNOSIS — I340 Nonrheumatic mitral (valve) insufficiency: Secondary | ICD-10-CM | POA: Diagnosis not present

## 2023-01-17 DIAGNOSIS — I4711 Inappropriate sinus tachycardia, so stated: Secondary | ICD-10-CM | POA: Diagnosis present

## 2023-01-17 DIAGNOSIS — G4733 Obstructive sleep apnea (adult) (pediatric): Secondary | ICD-10-CM | POA: Diagnosis present

## 2023-01-17 DIAGNOSIS — I484 Atypical atrial flutter: Secondary | ICD-10-CM | POA: Diagnosis not present

## 2023-01-17 DIAGNOSIS — E039 Hypothyroidism, unspecified: Secondary | ICD-10-CM

## 2023-01-17 DIAGNOSIS — I5022 Chronic systolic (congestive) heart failure: Secondary | ICD-10-CM

## 2023-01-17 DIAGNOSIS — D751 Secondary polycythemia: Secondary | ICD-10-CM

## 2023-01-17 DIAGNOSIS — R1084 Generalized abdominal pain: Principal | ICD-10-CM

## 2023-01-17 DIAGNOSIS — I483 Typical atrial flutter: Secondary | ICD-10-CM | POA: Diagnosis not present

## 2023-01-17 DIAGNOSIS — I480 Paroxysmal atrial fibrillation: Secondary | ICD-10-CM | POA: Diagnosis present

## 2023-01-17 DIAGNOSIS — E1122 Type 2 diabetes mellitus with diabetic chronic kidney disease: Secondary | ICD-10-CM | POA: Diagnosis present

## 2023-01-17 DIAGNOSIS — D631 Anemia in chronic kidney disease: Secondary | ICD-10-CM | POA: Diagnosis present

## 2023-01-17 DIAGNOSIS — Z7984 Long term (current) use of oral hypoglycemic drugs: Secondary | ICD-10-CM | POA: Diagnosis not present

## 2023-01-17 DIAGNOSIS — Z8249 Family history of ischemic heart disease and other diseases of the circulatory system: Secondary | ICD-10-CM | POA: Diagnosis not present

## 2023-01-17 DIAGNOSIS — N179 Acute kidney failure, unspecified: Secondary | ICD-10-CM | POA: Diagnosis present

## 2023-01-17 DIAGNOSIS — F1011 Alcohol abuse, in remission: Secondary | ICD-10-CM | POA: Diagnosis present

## 2023-01-17 DIAGNOSIS — E785 Hyperlipidemia, unspecified: Secondary | ICD-10-CM | POA: Diagnosis present

## 2023-01-17 LAB — CBC WITH DIFFERENTIAL/PLATELET
Abs Immature Granulocytes: 0.08 10*3/uL — ABNORMAL HIGH (ref 0.00–0.07)
Basophils Absolute: 0.1 10*3/uL (ref 0.0–0.1)
Basophils Relative: 1 %
Eosinophils Absolute: 0.1 10*3/uL (ref 0.0–0.5)
Eosinophils Relative: 1 %
HCT: 48.7 % (ref 39.0–52.0)
Hemoglobin: 16.2 g/dL (ref 13.0–17.0)
Immature Granulocytes: 1 %
Lymphocytes Relative: 16 %
Lymphs Abs: 1.4 10*3/uL (ref 0.7–4.0)
MCH: 28 pg (ref 26.0–34.0)
MCHC: 33.3 g/dL (ref 30.0–36.0)
MCV: 84.3 fL (ref 80.0–100.0)
Monocytes Absolute: 0.8 10*3/uL (ref 0.1–1.0)
Monocytes Relative: 9 %
Neutro Abs: 6.5 10*3/uL (ref 1.7–7.7)
Neutrophils Relative %: 72 %
Platelets: 258 10*3/uL (ref 150–400)
RBC: 5.78 MIL/uL (ref 4.22–5.81)
RDW: 14.7 % (ref 11.5–15.5)
WBC: 9 10*3/uL (ref 4.0–10.5)
nRBC: 0 % (ref 0.0–0.2)

## 2023-01-17 LAB — HEMOGLOBIN A1C
Hgb A1c MFr Bld: 7.7 % — ABNORMAL HIGH (ref 4.8–5.6)
Mean Plasma Glucose: 174.29 mg/dL

## 2023-01-17 LAB — BASIC METABOLIC PANEL
Anion gap: 14 (ref 5–15)
BUN: 47 mg/dL — ABNORMAL HIGH (ref 6–20)
CO2: 25 mmol/L (ref 22–32)
Calcium: 8.7 mg/dL — ABNORMAL LOW (ref 8.9–10.3)
Chloride: 96 mmol/L — ABNORMAL LOW (ref 98–111)
Creatinine, Ser: 4.27 mg/dL — ABNORMAL HIGH (ref 0.61–1.24)
GFR, Estimated: 15 mL/min — ABNORMAL LOW (ref >60)
Glucose, Bld: 195 mg/dL — ABNORMAL HIGH (ref 70–99)
Potassium: 3.7 mmol/L (ref 3.5–5.1)
Sodium: 135 mmol/L (ref 135–145)

## 2023-01-17 LAB — ETHANOL: Alcohol, Ethyl (B): <10 mg/dL (ref <10)

## 2023-01-17 MED ORDER — GABAPENTIN 100 MG PO CAPS
100.0000 mg | ORAL_CAPSULE | Freq: Three times a day (TID) | ORAL | Status: DC
  Administered 2023-01-17: 100 mg via ORAL
  Filled 2023-01-17: qty 1

## 2023-01-17 MED ORDER — SODIUM CHLORIDE 0.9 % IV BOLUS
250.0000 mL | Freq: Once | INTRAVENOUS | Status: AC
  Administered 2023-01-17: 250 mL via INTRAVENOUS

## 2023-01-17 MED ORDER — LEVOTHYROXINE SODIUM 25 MCG PO TABS
125.0000 ug | ORAL_TABLET | Freq: Every day | ORAL | Status: DC
  Administered 2023-01-18 – 2023-01-19 (×2): 125 ug via ORAL
  Filled 2023-01-17 (×2): qty 1

## 2023-01-17 MED ORDER — METOPROLOL SUCCINATE ER 25 MG PO TB24
12.5000 mg | ORAL_TABLET | Freq: Every day | ORAL | Status: DC
  Administered 2023-01-17 – 2023-01-18 (×2): 12.5 mg via ORAL
  Filled 2023-01-17 (×2): qty 1

## 2023-01-17 MED ORDER — AMIODARONE HCL 200 MG PO TABS
400.0000 mg | ORAL_TABLET | Freq: Every day | ORAL | Status: DC
  Administered 2023-01-17 – 2023-01-19 (×3): 400 mg via ORAL
  Filled 2023-01-17 (×3): qty 2

## 2023-01-17 MED ORDER — AMLODIPINE BESYLATE 5 MG PO TABS
5.0000 mg | ORAL_TABLET | Freq: Every day | ORAL | Status: DC
  Administered 2023-01-17 – 2023-01-18 (×2): 5 mg via ORAL
  Filled 2023-01-17 (×2): qty 1

## 2023-01-17 MED ORDER — GABAPENTIN 100 MG PO CAPS
100.0000 mg | ORAL_CAPSULE | Freq: Two times a day (BID) | ORAL | Status: DC
  Administered 2023-01-17 – 2023-01-19 (×4): 100 mg via ORAL
  Filled 2023-01-17 (×4): qty 1

## 2023-01-17 MED ORDER — PANCRELIPASE (LIP-PROT-AMYL) 36000-114000 UNITS PO CPEP
36000.0000 [IU] | ORAL_CAPSULE | Freq: Three times a day (TID) | ORAL | Status: DC
  Administered 2023-01-17 – 2023-01-19 (×6): 36000 [IU] via ORAL
  Filled 2023-01-17 (×9): qty 1

## 2023-01-17 MED ORDER — MORPHINE SULFATE (PF) 2 MG/ML IV SOLN: 1.0000 mg | INTRAVENOUS | Status: DC | PRN

## 2023-01-17 MED ORDER — OXYCODONE HCL 5 MG PO TABS
5.0000 mg | ORAL_TABLET | Freq: Four times a day (QID) | ORAL | Status: DC | PRN
  Administered 2023-01-17 – 2023-01-18 (×3): 5 mg via ORAL
  Filled 2023-01-17 (×3): qty 1

## 2023-01-17 NOTE — Assessment & Plan Note (Signed)
CT scan does not show any etiology of abdominal pain.  Started pain medications this morning and low-dose gabapentin.

## 2023-01-17 NOTE — Progress Notes (Signed)
Progress Note   Patient: Hector [name] ZOX:[id number] DOB: 1966/01/31 DOA: 01/16/2023     0 DOS: the patient was seen and examined on 01/17/2023   Brief hospital course: 57 year old man with past medical history of heart failure with reduced ejection fraction with EF less than 20%, nonischemic cardiomyopathy, history of alcohol use, paroxysmal atrial fibrillation/flutter on Eliquis, struct of sleep apnea on CPAP, chronic kidney disease stage IV, CVA, hypertension, hyperlipidemia, type 2 diabetes mellitus.  Patient presents with abdominal pain and found to be tachycardic.  CT scan of the abdomen without contrast did not find any findings in the abdomen or pelvis to explain the patient's abdominal pain, cholelithiasis, renal cysts.  11/24.  Patient still with quite a bit of abdominal pain.  Started pain medications and added gabapentin low-dose.  With creatinine a little bit higher will give a fluid bolus and hold water pills today.  Assessment and Plan: * Abdominal pain CT scan does not show any etiology of abdominal pain.  Started pain medications this morning and low-dose gabapentin.  Inappropriate sinus tachycardia (HCC) Patient already on amiodarone will increase the dose and add Toprol-XL.  Chronic renal impairment, stage 4 (severe) (HCC) Today's creatinine 4.27.  Hold water pill today 1 fluid bolus.  Reassess tomorrow.  Hypothyroidism With TSH being 23 will increase levothyroxine dose for tomorrow.  Chronic systolic CHF (congestive heart failure) (HCC) Patient has a history of HFrEF with last EF less than 20% in the setting of alcohol induced nonischemic cardiomyopathy.  Add low-dose Toprol patient on Jardiance and BiDil.  Holding Zaroxolyn and [employer] today.  Polycythemia Likely from sleep apnea  OSA (obstructive sleep apnea) Continue home CPAP  Type II diabetes mellitus with renal manifestations (HCC) - A1c 7.7 - Continue home Jardiance - SSI, moderate  Essential  hypertension Ambien Toprol this morning will add Norvasc this afternoon.  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        Subjective: Patient complaining of abdominal pain.  Seems like it is more superficial rather than inside.  Has had this issue before.  CT scan without contrast did not show any etiology.  Physical Exam: Vitals:   01/17/23 1130 01/17/23 1200 01/17/23 1230 01/17/23 1247  BP: (!) 133/105 (!) 132/108 (!) 129/100   Pulse: (!) 109 (!) 113 93   Resp: (!) 22 (!) 25 (!) 25   Temp:    98.1 F (36.7 C)  TempSrc:    Oral  SpO2: 90% 93% 92%   Weight:      Height:       Physical Exam HENT:     Head: Normocephalic.     Mouth/Throat:     Pharynx: No oropharyngeal exudate.  Eyes:     General: Lids are normal.     Conjunctiva/sclera: Conjunctivae normal.  Cardiovascular:     Rate and Rhythm: Normal rate and regular rhythm.     Heart sounds: Normal heart sounds, S1 normal and S2 normal.  Pulmonary:     Breath sounds: No decreased breath sounds, wheezing, rhonchi or rales.  Abdominal:     Palpations: Abdomen is soft.     Tenderness: There is generalized abdominal tenderness.  Musculoskeletal:     Right lower leg: No swelling.     Left lower leg: No swelling.  Skin:    General: Skin is warm.     Findings: No rash.  Neurological:     Mental Status:  He is alert and oriented to person, place, and time.     Data Reviewed: CT scan reviewed above Creatinine 4.27, BUN 47 sodium 135, GFR 15, white blood cell count 9.0, hemoglobin 16.2, platelet count 258  Disposition: Status is: Observation Added Toprol this morning will add Norvasc this afternoon for elevated blood pressure.  Added gabapentin for abdominal pain and pain medications this morning.  Planned Discharge Destination: Home    Time spent: 28 minutes  Author: Alford Highland, MD 01/17/2023 2:15 PM  For on call review  [email or web address].

## 2023-01-17 NOTE — Hospital Course (Signed)
57 year old man with past medical history of heart failure with reduced ejection fraction with EF less than 20%, nonischemic cardiomyopathy, history of alcohol use, paroxysmal atrial fibrillation/flutter on Eliquis, struct of sleep apnea on CPAP, chronic kidney disease stage IV, CVA, hypertension, hyperlipidemia, type 2 diabetes mellitus.  Patient presents with abdominal pain and found to be tachycardic.  CT scan of the abdomen without contrast did not find any findings in the abdomen or pelvis to explain the patient's abdominal pain, cholelithiasis, renal cysts.  11/24.  Patient still with quite a bit of abdominal pain.  Started pain medications and added gabapentin low-dose.  With creatinine a little bit higher will give a fluid bolus and hold water pills today. 11/25.  Consulted heart failure team and nephrology with creatinine being a little higher. 11/26.  Cardiology decided to do a cardioversion for atrial flutter.  Patient converted into normal sinus rhythm.  Continue amiodarone 400 mg daily for 1 week and then down to 200 mg after that.  Continue Eliquis.  Creatinine 4.53 upon discharge with a GFR of 14.  Nephrology okay with patient discharging and following up with [facility] nephrology as outpatient.  Will also follow-up with CHF clinic.  Patient having no further abdominal pain.

## 2023-01-17 NOTE — ED Notes (Signed)
Provider notified of BP 162/123

## 2023-01-17 NOTE — ED Notes (Signed)
Provider notified of patients pain 7/10 and request for pain meds

## 2023-01-17 NOTE — ED Notes (Signed)
Advised nurse that patient has ready bed 

## 2023-01-18 DIAGNOSIS — I484 Atypical atrial flutter: Secondary | ICD-10-CM | POA: Diagnosis not present

## 2023-01-18 DIAGNOSIS — I5043 Acute on chronic combined systolic (congestive) and diastolic (congestive) heart failure: Secondary | ICD-10-CM

## 2023-01-18 DIAGNOSIS — I5022 Chronic systolic (congestive) heart failure: Secondary | ICD-10-CM | POA: Diagnosis not present

## 2023-01-18 DIAGNOSIS — E1121 Type 2 diabetes mellitus with diabetic nephropathy: Secondary | ICD-10-CM

## 2023-01-18 DIAGNOSIS — I4711 Inappropriate sinus tachycardia, so stated: Secondary | ICD-10-CM | POA: Diagnosis not present

## 2023-01-18 DIAGNOSIS — E039 Hypothyroidism, unspecified: Secondary | ICD-10-CM | POA: Diagnosis not present

## 2023-01-18 DIAGNOSIS — R1084 Generalized abdominal pain: Secondary | ICD-10-CM | POA: Diagnosis not present

## 2023-01-18 DIAGNOSIS — N189 Chronic kidney disease, unspecified: Secondary | ICD-10-CM

## 2023-01-18 DIAGNOSIS — N179 Acute kidney failure, unspecified: Secondary | ICD-10-CM

## 2023-01-18 LAB — BASIC METABOLIC PANEL
Anion gap: 12 (ref 5–15)
BUN: 52 mg/dL — ABNORMAL HIGH (ref 6–20)
CO2: 25 mmol/L (ref 22–32)
Calcium: 8.3 mg/dL — ABNORMAL LOW (ref 8.9–10.3)
Chloride: 94 mmol/L — ABNORMAL LOW (ref 98–111)
Creatinine, Ser: 4.52 mg/dL — ABNORMAL HIGH (ref 0.61–1.24)
GFR, Estimated: 14 mL/min — ABNORMAL LOW (ref >60)
Glucose, Bld: 179 mg/dL — ABNORMAL HIGH (ref 70–99)
Potassium: 4 mmol/L (ref 3.5–5.1)
Sodium: 131 mmol/L — ABNORMAL LOW (ref 135–145)

## 2023-01-18 MED ORDER — OXYCODONE HCL 5 MG PO TABS
2.5000 mg | ORAL_TABLET | Freq: Four times a day (QID) | ORAL | Status: DC | PRN
  Administered 2023-01-18: 2.5 mg via ORAL
  Filled 2023-01-18: qty 1

## 2023-01-18 MED ORDER — FUROSEMIDE 10 MG/ML IJ SOLN: 80.0000 mg | Freq: Once | INTRAMUSCULAR | Status: DC

## 2023-01-18 MED ORDER — ISOSORB DINITRATE-HYDRALAZINE 20-37.5 MG PO TABS
1.0000 | ORAL_TABLET | Freq: Three times a day (TID) | ORAL | Status: DC
  Administered 2023-01-18: 1 via ORAL
  Filled 2023-01-18 (×2): qty 1

## 2023-01-18 MED ORDER — FUROSEMIDE 10 MG/ML IJ SOLN
80.0000 mg | Freq: Once | INTRAMUSCULAR | Status: AC
  Administered 2023-01-18: 80 mg via INTRAVENOUS
  Filled 2023-01-18: qty 8

## 2023-01-18 NOTE — Consult Note (Addendum)
Advanced Heart Failure Team Consult Note   Primary Physician: Oswaldo Conroy, MD PCP-Cardiologist:  Julien Nordmann, MD  Reason for Consultation: Acute on chronic combined systolic and diastolic congestive heart failure   HPI:    Hector [name] is seen today for evaluation of acute on chronic combined systolic and diastolic congestive heart failure at the request of Dr. Renae Gloss, hospital medicine.   Hector [name] is a 57 y.o. male with chronic combined systolic and diastolic congestive heart failure ETOH use, DM2, morbid obesity, PAF (h/o GIB on apixaban),  OSA on CPAP, CKD stage IV (Scr ~3.3), medication noncompliance. HF dates back to at least 2009, thought to presumably be nonischemic in the setting of alcohol use, with an EF of 20%.     Echo 2016 EF 20-25%. LHC 12/2014 minimal CAD   EF initially improved with restoration of NSR but then dropped back down    Underwent TEE/DCCV 02/2021: EF <20% with severely reduced RV systolic function.  He was reevaluated by EP in 04/2021 and felt not to be a good candidate for ICD or ablation given medication noncompliance.   Admitted 9/23 & 10/23 with ADHF. Limited echo 10/23 EF < 20% with mod RV dysfunction and moderate to sever MR.  Discharg weight 247 pounds.     Admitted 11/23, 12/23, 1/24 and 3/24 with recurrent HF.    3/24 underwent TEE/DCCV as well as RHC. TEE EF < 20%, mod RV HK, mod MR. RHC with markedly elevated filling pressures and low CO. RA 17, PA 58/39 (43), PCWP 33, Fick 3.4/1.5 (PA sat 49%), PAPi 1.2    Seen in ER 05/18/22 with recurrent volume overload. Lasix switched to torsemide and metolazone added.  Has been admitted twice with GI issues since last seen in AHF clinic in April. He presented to the ED yesterday with abdominal pain. Has had ongoing pain for the last 3 days. Denies CP/SOB. Appetite has been good, states he eats out a lot. His mom and brother bring home a lot of fast food. States he's compliant with AM  meds but does not always take PM meds. Labs reviewed from admission: SCr 4.04, K 3.9, Na 134, AST/ALT WNL, BNP 358, HsTrop 99>96, CXR with no acute findings, EKG with ST and old LBBB. CTA/P showed no acute findings just some Cholelithiasis. Reports no ETOH/smoking use  Resting comfortably in bed. Denies CP/SOB. Has been ambulating around the room.     Home Medications Prior to Admission medications   Medication Sig Start Date End Date Taking? Authorizing Provider  allopurinol (ZYLOPRIM) 100 MG tablet Take 0.5 tablets (50 mg total) by mouth daily. 10/03/21  Yes Danford, Earl Lites, MD  amiodarone (PACERONE) 200 MG tablet Take 1 tablet (200 mg total) by mouth daily. 06/29/22  Yes Bensimhon, Bevelyn Buckles, MD  atorvastatin (LIPITOR) 80 MG tablet Take 1 tablet (80 mg total) by mouth daily. 11/24/21  Yes Hollice Espy, MD  empagliflozin (JARDIANCE) 10 MG TABS tablet Take 1 tablet (10 mg total) by mouth daily. 05/26/22  Yes Hackney, Inetta Fermo A, FNP  ferrous sulfate 325 (65 FE) MG EC tablet Take 325 mg by mouth daily with breakfast.   Yes [provider]  isosorbide-hydrALAZINE (BIDIL) 20-37.5 MG tablet Take 0.5 tablets by mouth 3 (three) times daily. Patient taking differently: Take 1 tablet by mouth 2 (two) times daily. 06/17/22  Yes Bensimhon, Bevelyn Buckles, MD  metolazone (ZAROXOLYN) 2.5 MG tablet Take 1 tablet every Monday with of Potassium. 06/29/22  Yes Bensimhon, Bevelyn Buckles, MD  torsemide (DEMADEX) 20 MG tablet Take 40 mg by mouth daily.   Yes [provider]  acetaminophen (TYLENOL) 325 MG tablet Take 2 tablets (650 mg total) by mouth every 6 (six) hours as needed for mild pain (or Fever >/= 101). Patient not taking: Reported on 01/16/2023 09/29/22   Loyce Dys, MD  apixaban (ELIQUIS) 5 MG TABS tablet Take 1 tablet (5 mg total) by mouth 2 (two) times daily. Patient not taking: Reported on 01/16/2023 10/02/21   Alberteen Sam, MD  famotidine (PEPCID) 20 MG tablet Take 1 tablet  (20 mg total) by mouth daily. Patient not taking: Reported on 09/29/2022 05/05/22   Arnetha Courser, MD  levothyroxine (SYNTHROID) 100 MCG tablet Take 1 tablet (100 mcg total) by mouth daily before breakfast. Patient not taking: Reported on 07/17/2022 11/24/21   Hollice Espy, MD  lipase/protease/amylase (CREON) 36000 UNITS CPEP capsule Take 1 capsule (36,000 Units total) by mouth 3 (three) times daily with meals. Patient not taking: Reported on 01/16/2023 05/04/22   Arnetha Courser, MD  ondansetron (ZOFRAN-ODT) 4 MG disintegrating tablet Take 1 tablet (4 mg total) by mouth every 8 (eight) hours as needed for nausea or vomiting. Patient not taking: Reported on 01/16/2023 09/20/22   Minna Antis, MD    Past Medical History: Past Medical History:  Diagnosis Date   Alcohol abuse    Alcoholic cardiomyopathy (HCC)    a. 12/2007 MV: EF 28%, no isch;  b. 8/12 Echo: EF 25-35%; c. 02/2014 Echo: EF 20-25%; d. 12/2014 Cath: minimal CAD; e. 01/2015 Echo: EF 50-55%;  d. 05/2016 Echo: EF 30-35%, diff HK, gr2 DD; e. 11/2016 Echo: EF 45-50%, diff HK; f. 09/2021 Echo: EF <20%; g. 11/2021 Echo: EF<20%.   Chronic combined systolic (congestive) and diastolic (congestive) heart failure (HCC)    a. 05/2016 Echo: EF 30-35%; b. 11/2016 Echo: EF 45-50%, diff HK; c. 09/2021 Echo: EF < 20%; d. 11/2021 Echo: EF <20%, no rwma, Nl RV fxn, sev dil LA, mod-sev MR. mod dil PA w/ mildly elev PASP.   CKD (chronic kidney disease), stage III (HCC)    Elevated troponin (chronic)    Essential hypertension    GI bleed 11/2013   Hyperlipidemia    Pancreatitis    Paroxysmal A-fib (HCC)    a. new onset s/p unsuccessful TEE/DCCV on 08/16/2014; b. CHA2DS2VASc = 3-> eliquis (freq noncompliant); c. 02/2021 s/p TEE/DCCV; d. Prev on amio->d/c 2/2 abnl TFTs.   Paroxysmal atrial flutter (HCC)    Sleep-disordered breathing    Has yet to have a sleep study   Stroke [facility])     Past Surgical History: Past Surgical History:  Procedure Laterality  Date   CARDIAC CATHETERIZATION N/A 01/09/2015   Procedure: Left Heart Cath and Coronary Angiography;  Surgeon: Marykay Lex, MD;  Location: [facility] INVASIVE CV LAB;  Service: Cardiovascular;  Laterality: N/A;   CARDIOVERSION N/A 03/05/2021   Procedure: CARDIOVERSION;  Surgeon: Debbe Odea, MD;  Location: [facility] ORS;  Service: Cardiovascular;  Laterality: N/A;   CENTRAL LINE INSERTION  04/28/2022   Procedure: CENTRAL LINE INSERTION;  Surgeon: Yvonne Kendall, MD;  Location: [facility] INVASIVE CV LAB;  Service: Cardiovascular;;   COLONOSCOPY N/A 07/22/2020   Procedure: COLONOSCOPY;  Surgeon: Toledo, Boykin Nearing, MD;  Location: [facility] ENDOSCOPY;  Service: Gastroenterology;  Laterality: N/A;   ELECTROPHYSIOLOGIC STUDY N/A 08/16/2014   Procedure: CARDIOVERSION;  Surgeon: Antonieta Iba, MD;  Location: [facility] ORS;  Service: Cardiovascular;  Laterality: N/A;  ESOPHAGOGASTRODUODENOSCOPY (EGD) WITH PROPOFOL N/A 05/04/2022   Procedure: ESOPHAGOGASTRODUODENOSCOPY (EGD) WITH PROPOFOL;  Surgeon: Toney Reil, MD;  Location: [facility] ENDOSCOPY;  Service: Gastroenterology;  Laterality: N/A;   FLEXIBLE SIGMOIDOSCOPY N/A 10/10/2015   Procedure: FLEXIBLE SIGMOIDOSCOPY;  Surgeon: Christena Deem, MD;  Location: [facility] ENDOSCOPY;  Service: Endoscopy;  Laterality: N/A;   KNEE SURGERY Right    NM MYOVIEW LTD  November 2011   No ischemia or infarction. EF 50-55% ( no improvement from 2009 Myoview EF of 28%   RIGHT HEART CATH N/A 04/28/2022   Procedure: RIGHT HEART CATH;  Surgeon: Yvonne Kendall, MD;  Location: [facility] INVASIVE CV LAB;  Service: Cardiovascular;  Laterality: N/A;   TEE WITHOUT CARDIOVERSION N/A 08/16/2014   Procedure: TRANSESOPHAGEAL ECHOCARDIOGRAM (TEE);  Surgeon: Antonieta Iba, MD;  Location: [facility] ORS;  Service: Cardiovascular;  Laterality: N/A;   TEE WITHOUT CARDIOVERSION N/A 03/05/2021   Procedure: TRANSESOPHAGEAL ECHOCARDIOGRAM (TEE);  Surgeon: Debbe Odea, MD;  Location: [facility] ORS;  Service:  Cardiovascular;  Laterality: N/A;   TEE WITHOUT CARDIOVERSION N/A 05/01/2022   Procedure: TRANSESOPHAGEAL ECHOCARDIOGRAM;  Surgeon: Laurey Morale, MD;  Location: [facility] ORS;  Service: Cardiovascular;  Laterality: N/A;  TEE with cardioversion    Family History: Family History  Problem Relation Age of Onset   Hypertension Mother    Hyperlipidemia Mother    Diabetes Mother     Social History: Social History   Socioeconomic History   Marital status: Single    Spouse name: Not on file   Number of children: Not on file   Years of education: Not on file   Highest education level: Not on file  Occupational History   Not on file  Tobacco Use   Smoking status: Former    Current packs/day: 0.00    Average packs/day: 1 pack/day for 12.0 years (12.0 ttl pk-yrs)    Types: Cigarettes    Start date: 02/23/1986    Quit date: 02/23/1998    Years since quitting: 24.9   Smokeless tobacco: Never  Vaping Use   Vaping status: Never Used  Substance and Sexual Activity   Alcohol use: Not Currently    Alcohol/week: 0.0 standard drinks of alcohol    Comment: Past heavy drinker   Drug use: No   Sexual activity: Not Currently  Other Topics Concern   Not on file  Social History Narrative   Not on file   Social Determinants of Health   Financial Resource Strain: Low Risk  (01/04/2019)   Overall Financial Resource Strain (CARDIA)    Difficulty of Paying Living Expenses: Not hard at all  Food Insecurity: No Food Insecurity (01/17/2023)   Hunger Vital Sign    Worried About Running Out of Food in the Last Year: Never true    Ran Out of Food in the Last Year: Never true  Transportation Needs: No Transportation Needs (01/17/2023)   PRAPARE - Administrator, Civil Service (Medical): No    Lack of Transportation (Non-Medical): No  Physical Activity: Inactive (01/04/2019)   Exercise Vital Sign    Days of Exercise per Week: 0 days    Minutes of Exercise per Session: 0 min  Stress: No  Stress Concern Present (01/04/2019)   [employer] of Occupational Health - Occupational Stress Questionnaire    Feeling of Stress : Not at all  Social Connections: Moderately Isolated (01/04/2019)   Social Connection and Isolation Panel [NHANES]    Frequency of Communication with Friends and Family: More than three  times a week    Frequency of Social Gatherings with Friends and Family: More than three times a week    Attends Religious Services: 1 to 4 times per year    Active Member of [employer] or Organizations: No    Attends Banker Meetings: Never    Marital Status: Never married    Allergies:  Allergies  Allergen Reactions   Esomeprazole Magnesium Other (See Comments)    Suspected interstitial nephritis 2018   Egg-Derived Products Rash    Objective:    Vital Signs:   Temp:  [97.4 F (36.3 C)-98.5 F (36.9 C)] 98.5 F (36.9 C) (11/25 1126) Pulse Rate:  [91-113] 92 (11/25 1126) Resp:  [12-25] 12 (11/25 1126) BP: (90-136)/(71-108) 105/77 (11/25 1126) SpO2:  [91 %-98 %] 93 % (11/25 1126) Weight:  [122.6 kg] 122.6 kg (11/25 0500) Last BM Date : 01/17/23  Weight change: Filed Weights   01/16/23 1046 01/18/23 0500  Weight: 111.1 kg 122.6 kg    Intake/Output:  No intake or output data in the 24 hours ending 01/18/23 1135    Physical Exam    General:  well appearing.  No respiratory difficulty HEENT: normal Neck: supple. JVD ~12 cm. Carotids 2+ bilat; no bruits. No lymphadenopathy or thyromegaly appreciated. Cor: PMI nondisplaced. Regular rate & rhythm. No rubs, gallops or murmurs. Lungs: clear, diminished bases Abdomen: soft, nontender, mildly distended. No hepatosplenomegaly. No bruits or masses. Good bowel sounds. Extremities: no cyanosis, clubbing, rash, edema  Neuro: alert & oriented x 3, cranial nerves grossly intact. moves all 4 extremities w/o difficulty. Affect pleasant.   Telemetry   NSR 90s (Personally reviewed)    EKG    ST 112  LBBB  Labs  Basic Metabolic Panel: Recent Labs  Lab 01/16/23 1133 01/17/23 0546 01/18/23 0801  NA 134* 135 131*  K 3.9 3.7 4.0  CL 96* 96* 94*  CO2 24 25 25   GLUCOSE 259* 195* 179*  BUN 45* 47* 52*  CREATININE 4.04* 4.27* 4.52*  CALCIUM 8.7* 8.7* 8.3*   Liver Function Tests: Recent Labs  Lab 01/16/23 1133  AST 25  ALT 18  ALKPHOS 114  BILITOT 0.9  PROT 7.8  ALBUMIN 3.8   Recent Labs  Lab 01/16/23 1203  LIPASE 29   No results for input(s): "AMMONIA" in the last 168 hours.  CBC: Recent Labs  Lab 01/16/23 1133 01/17/23 0546  WBC 9.3 9.0  NEUTROABS 6.3 6.5  HGB 17.3* 16.2  HCT 53.2* 48.7  MCV 85.8 84.3  PLT 295 258   Cardiac Enzymes: No results for input(s): "CKTOTAL", "CKMB", "CKMBINDEX", "TROPONINI" in the last 168 hours.  BNP: BNP (last 3 results) Recent Labs    06/17/22 1519 09/25/22 2248 01/16/23 1133  BNP 897.4* 284.0* 358.6*   ProBNP (last 3 results) No results for input(s): "PROBNP" in the last 8760 hours.  CBG: No results for input(s): "GLUCAP" in the last 168 hours.  Coagulation Studies: No results for input(s): "LABPROT", "INR" in the last 72 hours.  Imaging   No results found.  Medications:    Current Medications:  allopurinol  50 mg Oral Daily   amiodarone  400 mg Oral Daily   amLODipine  5 mg Oral Daily   apixaban  5 mg Oral BID   atorvastatin  80 mg Oral Daily   empagliflozin  10 mg Oral Daily   ferrous sulfate  325 mg Oral Q breakfast   gabapentin  100 mg Oral BID   isosorbide-hydrALAZINE  1 tablet Oral BID   levothyroxine  125 mcg Oral Q0600   lipase/protease/amylase  36,000 Units Oral TID AC   melatonin  5 mg Oral Once   metoprolol succinate  12.5 mg Oral Daily   sodium chloride flush  3 mL Intravenous Q12H    Infusions:   Patient Profile   Hector [name] is a 57 y.o. male with chronic combined systolic and diastolic congestive heart failure, Hx of ETOH use, DM2, morbid obesity, PAF (h/o GIB on apixaban),   OSA on CPAP, CKD stage IV (Scr ~3.3), medication noncompliance. AHF team to see for acute on chronic combined systolic and diastolic congestive heart failure.  Assessment/Plan  Acute on chronic combined systolic and diastolic congestive heart failure - Onset 2009 -LHC 2016 minimal CAD -Echo10/23. LVEF<20% RV moderately down moderate to severe MR -Has had multiple hospitalizations for heart failure exacerbation likely secondary to noncompliance and end-stage CM - Patient was previously deemed not to be ICD insertion by EP due to noncompliance - Etiology of LV dysfunction remains unclear but may have a component of tachy-CM with AF. Has been seen by EP and not felt to be candidate for ablation.  - Echo 3/24 EF < 20% Moderate RV dysfunction - RHC 3/24 RA 17 PA 58/39 (43) PCWP 33 Fick 3.4/1.5 (PA sat 49%) PAPi 1.2  - NYHA II-IIIa on admission, volume appears mildly elevated - Follows with [facility] nephrology, last saw last week. No changes to home diuretic regimen. He reports good UOP. Will add 80 IV lasix today and follow diuresis. May need more, hopefully SCr improved with diuresis - Continue Jardiance 10 mg daily - No ACE inhibitor/ARB/ARNI/MRA secondary to stage IV CKD - No b-blocker with hx of low-output and while he diuresis - Continue BIDIL for BP control, will increase if BP rises - He has end-stage HF with poor insight into his illness.  - Not a candidate for advanced therapies with renal disease and noncompliance.  - Strict I&O, daily weights  Abdominal pain - per primary - cholelithiasis?  - mostly resolved  CKD stage 4a - Follows with nephrology at [facility], recently last saw last week. No changed were made to home diuretic regimen - SCr up to 4.52 today, baseline appears to be around 2.8-3.5 - Follow with diuresis - Avoid hypotension, will not decrease too much to allow for renal perfusion   PAF - In NSR today - has previously seen EP, not a candidate for an ablation - Continue  amiodarone 400 mg daily - Continue Eliquis 5 mg BID  HTN  - Currently stable - Stop amlodipine and carvedilol - Continue BIDIL, can increase if BP continues to rise. Typically only takes once dialy, long term issue  Obesity  - Body mass index is 37.7 kg/m.   OSA  - reports noncompliance with CPAP at home - wearing it here, stressed importance of being compliant with it at home  Hypothyroid - synthroid increased - per primary  Length of Stay: 1  Alen Bleacher, NP  01/18/2023, 11:35 AM  Advanced Heart Failure Team Pager [phone] (M-F; 7a - 5p)  Please contact [facility] for night-coverage after hours (4p -7a ) and weekends on [email or web address]   Patient seen with NP, agree with the above note.   History as noted above.  Admitted with abdominal pain for several days prior to admission.  He is noted to be in atypical atrial flutter with 2:1 block, HR generally running in 100s.  Denies dyspnea currently. Creatinine higher  than baseline at 4.52.   Poor compliance with medical regimen at home.   General: NAD Neck: JVP 10-12 cm, no thyromegaly or thyroid nodule.  Lungs: Clear to auscultation bilaterally with normal respiratory effort. CV: Nondisplaced PMI.  Heart mildly tachy, regular S1/S2, no S3/S4, no murmur.  No peripheral edema.  No carotid bruit.  Difficult to palpate pedal pulses.  Abdomen: Soft, nontender, no hepatosplenomegaly, no distention.  Skin: Intact without lesions or rashes.  Neurologic: Alert and oriented x 3.  Psych: Normal affect. Extremities: No clubbing or cyanosis.  HEENT: Normal.   1. Acute on chronic systolic CHF: Nonischemic cardiomyopathy, cath in 2016 with minimal CAD.  Cardiomyopathy since 2009.  TEE in 3/24 showed EF < 20%, mild LV dilation, moderate RV dysfunction, moderate MR.  On exam, he is mildly volume overloaded but creatinine is above baseline at 4.5. Exacerbation may have been triggered by onset of atypical atrial flutter.  - Lasix 80 mg IV x 1  then back to po tomorrow.  - Continue Jardiance 10 mg daily.  - Continue Bidil 1 tab tid.  - Hold off on beta blocker for now with low output HF.   - End stage HF, low output on RHC In 3/24, poor compliance with medication regimen and followup.  Therefore, not candidate for advanced therapies and also deemed not candidate for ICD.  2. Atrial arrhythmias: H/o paroxysmal AF. He is actually in atypical atrial flutter today (reviewed with EP) and has been in flutter since admission.  This may have triggered exacerbation.   - Continue amiodarone 400 mg daily for now.  - Continue Eliquis.   - Will arrange for TEE-guided DCCV tomorrow (unsure of compliance with Eliquis at home).  Discussed risks/benefits with patient and he agrees to procedure.  3. Abdominal pain: Resolved.  Etiology uncertain.  4. AKI on CKD stage IV: Creatinine 4.5, up a bit from yesterday.  Still with some volume overload on exam.  - Will give 1 dose IV Lasix today.  - Cardioversion tomorrow may help forward output.  - Avoid hypotension, stop amlodipine.  5. OSA: CPAP 6. HTN: Use Bidil for now, hold amlodipine and Coreg.  BP not elevated.  Avoid hypotension with CKD.   Marca Ancona 01/18/2023 4:06 PM

## 2023-01-18 NOTE — Assessment & Plan Note (Addendum)
AKI on CKD stage IV.  Today's creatinine up at 4.53.  Nephrology okay with discharging and following up with [facility] nephrology as outpatient.

## 2023-01-18 NOTE — Plan of Care (Signed)
Progressing towards goals

## 2023-01-18 NOTE — Progress Notes (Signed)
Transition of Care [facility]) - Inpatient Brief Assessment   Patient Details  Name: Hector [name] MRN: [id number] Date of Birth: May 13, 1965  Transition of [facility] CM/SW Contact:    Truddie Hidden, RN Phone Number: 01/18/2023, 2:57 PM   Clinical Narrative: TOC continuing to follow patient's progress throughout discharge planning.   Transition of Care Asessment: Insurance and Status: Insurance coverage has been reviewed Patient has primary care physician: Yes Home environment has been reviewed: home Prior level of function:: independent Prior/Current Home Services: No current home services Social Determinants of Health Reivew: SDOH reviewed no interventions necessary Readmission risk has been reviewed: Yes Transition of care needs: no transition of care needs at this time

## 2023-01-18 NOTE — H&P (View-Only) (Signed)
Advanced Heart Failure Team Consult Note   Primary Physician: Oswaldo Conroy, MD PCP-Cardiologist:  Julien Nordmann, MD  Reason for Consultation: Acute on chronic combined systolic and diastolic congestive heart failure   HPI:    Hector [name] is seen today for evaluation of acute on chronic combined systolic and diastolic congestive heart failure at the request of Dr. Renae Gloss, hospital medicine.   Hector [name] is a 57 y.o. male with chronic combined systolic and diastolic congestive heart failure ETOH use, DM2, morbid obesity, PAF (h/o GIB on apixaban),  OSA on CPAP, CKD stage IV (Scr ~3.3), medication noncompliance. HF dates back to at least 2009, thought to presumably be nonischemic in the setting of alcohol use, with an EF of 20%.     Echo 2016 EF 20-25%. LHC 12/2014 minimal CAD   EF initially improved with restoration of NSR but then dropped back down    Underwent TEE/DCCV 02/2021: EF <20% with severely reduced RV systolic function.  He was reevaluated by EP in 04/2021 and felt not to be a good candidate for ICD or ablation given medication noncompliance.   Admitted 9/23 & 10/23 with ADHF. Limited echo 10/23 EF < 20% with mod RV dysfunction and moderate to sever MR.  Discharg weight 247 pounds.     Admitted 11/23, 12/23, 1/24 and 3/24 with recurrent HF.    3/24 underwent TEE/DCCV as well as RHC. TEE EF < 20%, mod RV HK, mod MR. RHC with markedly elevated filling pressures and low CO. RA 17, PA 58/39 (43), PCWP 33, Fick 3.4/1.5 (PA sat 49%), PAPi 1.2    Seen in ER 05/18/22 with recurrent volume overload. Lasix switched to torsemide and metolazone added.  Has been admitted twice with GI issues since last seen in AHF clinic in April. He presented to the ED yesterday with abdominal pain. Has had ongoing pain for the last 3 days. Denies CP/SOB. Appetite has been good, states he eats out a lot. His mom and brother bring home a lot of fast food. States he's compliant with AM  meds but does not always take PM meds. Labs reviewed from admission: SCr 4.04, K 3.9, Na 134, AST/ALT WNL, BNP 358, HsTrop 99>96, CXR with no acute findings, EKG with ST and old LBBB. CTA/P showed no acute findings just some Cholelithiasis. Reports no ETOH/smoking use  Resting comfortably in bed. Denies CP/SOB. Has been ambulating around the room.     Home Medications Prior to Admission medications   Medication Sig Start Date End Date Taking? Authorizing Provider  allopurinol (ZYLOPRIM) 100 MG tablet Take 0.5 tablets (50 mg total) by mouth daily. 10/03/21  Yes Danford, Earl Lites, MD  amiodarone (PACERONE) 200 MG tablet Take 1 tablet (200 mg total) by mouth daily. 06/29/22  Yes Bensimhon, Bevelyn Buckles, MD  atorvastatin (LIPITOR) 80 MG tablet Take 1 tablet (80 mg total) by mouth daily. 11/24/21  Yes Hollice Espy, MD  empagliflozin (JARDIANCE) 10 MG TABS tablet Take 1 tablet (10 mg total) by mouth daily. 05/26/22  Yes Hackney, Inetta Fermo A, FNP  ferrous sulfate 325 (65 FE) MG EC tablet Take 325 mg by mouth daily with breakfast.   Yes [provider]  isosorbide-hydrALAZINE (BIDIL) 20-37.5 MG tablet Take 0.5 tablets by mouth 3 (three) times daily. Patient taking differently: Take 1 tablet by mouth 2 (two) times daily. 06/17/22  Yes Bensimhon, Bevelyn Buckles, MD  metolazone (ZAROXOLYN) 2.5 MG tablet Take 1 tablet every Monday with of Potassium. 06/29/22  Yes Bensimhon, Bevelyn Buckles, MD  torsemide (DEMADEX) 20 MG tablet Take 40 mg by mouth daily.   Yes [provider]  acetaminophen (TYLENOL) 325 MG tablet Take 2 tablets (650 mg total) by mouth every 6 (six) hours as needed for mild pain (or Fever >/= 101). Patient not taking: Reported on 01/16/2023 09/29/22   Loyce Dys, MD  apixaban (ELIQUIS) 5 MG TABS tablet Take 1 tablet (5 mg total) by mouth 2 (two) times daily. Patient not taking: Reported on 01/16/2023 10/02/21   Alberteen Sam, MD  famotidine (PEPCID) 20 MG tablet Take 1 tablet  (20 mg total) by mouth daily. Patient not taking: Reported on 09/29/2022 05/05/22   Arnetha Courser, MD  levothyroxine (SYNTHROID) 100 MCG tablet Take 1 tablet (100 mcg total) by mouth daily before breakfast. Patient not taking: Reported on 07/17/2022 11/24/21   Hollice Espy, MD  lipase/protease/amylase (CREON) 36000 UNITS CPEP capsule Take 1 capsule (36,000 Units total) by mouth 3 (three) times daily with meals. Patient not taking: Reported on 01/16/2023 05/04/22   Arnetha Courser, MD  ondansetron (ZOFRAN-ODT) 4 MG disintegrating tablet Take 1 tablet (4 mg total) by mouth every 8 (eight) hours as needed for nausea or vomiting. Patient not taking: Reported on 01/16/2023 09/20/22   Minna Antis, MD    Past Medical History: Past Medical History:  Diagnosis Date   Alcohol abuse    Alcoholic cardiomyopathy (HCC)    a. 12/2007 MV: EF 28%, no isch;  b. 8/12 Echo: EF 25-35%; c. 02/2014 Echo: EF 20-25%; d. 12/2014 Cath: minimal CAD; e. 01/2015 Echo: EF 50-55%;  d. 05/2016 Echo: EF 30-35%, diff HK, gr2 DD; e. 11/2016 Echo: EF 45-50%, diff HK; f. 09/2021 Echo: EF <20%; g. 11/2021 Echo: EF<20%.   Chronic combined systolic (congestive) and diastolic (congestive) heart failure (HCC)    a. 05/2016 Echo: EF 30-35%; b. 11/2016 Echo: EF 45-50%, diff HK; c. 09/2021 Echo: EF < 20%; d. 11/2021 Echo: EF <20%, no rwma, Nl RV fxn, sev dil LA, mod-sev MR. mod dil PA w/ mildly elev PASP.   CKD (chronic kidney disease), stage III (HCC)    Elevated troponin (chronic)    Essential hypertension    GI bleed 11/2013   Hyperlipidemia    Pancreatitis    Paroxysmal A-fib (HCC)    a. new onset s/p unsuccessful TEE/DCCV on 08/16/2014; b. CHA2DS2VASc = 3-> eliquis (freq noncompliant); c. 02/2021 s/p TEE/DCCV; d. Prev on amio->d/c 2/2 abnl TFTs.   Paroxysmal atrial flutter (HCC)    Sleep-disordered breathing    Has yet to have a sleep study   Stroke [facility])     Past Surgical History: Past Surgical History:  Procedure Laterality  Date   CARDIAC CATHETERIZATION N/A 01/09/2015   Procedure: Left Heart Cath and Coronary Angiography;  Surgeon: Marykay Lex, MD;  Location: [facility] INVASIVE CV LAB;  Service: Cardiovascular;  Laterality: N/A;   CARDIOVERSION N/A 03/05/2021   Procedure: CARDIOVERSION;  Surgeon: Debbe Odea, MD;  Location: [facility] ORS;  Service: Cardiovascular;  Laterality: N/A;   CENTRAL LINE INSERTION  04/28/2022   Procedure: CENTRAL LINE INSERTION;  Surgeon: Yvonne Kendall, MD;  Location: [facility] INVASIVE CV LAB;  Service: Cardiovascular;;   COLONOSCOPY N/A 07/22/2020   Procedure: COLONOSCOPY;  Surgeon: Toledo, Boykin Nearing, MD;  Location: [facility] ENDOSCOPY;  Service: Gastroenterology;  Laterality: N/A;   ELECTROPHYSIOLOGIC STUDY N/A 08/16/2014   Procedure: CARDIOVERSION;  Surgeon: Antonieta Iba, MD;  Location: [facility] ORS;  Service: Cardiovascular;  Laterality: N/A;  ESOPHAGOGASTRODUODENOSCOPY (EGD) WITH PROPOFOL N/A 05/04/2022   Procedure: ESOPHAGOGASTRODUODENOSCOPY (EGD) WITH PROPOFOL;  Surgeon: Toney Reil, MD;  Location: [facility] ENDOSCOPY;  Service: Gastroenterology;  Laterality: N/A;   FLEXIBLE SIGMOIDOSCOPY N/A 10/10/2015   Procedure: FLEXIBLE SIGMOIDOSCOPY;  Surgeon: Christena Deem, MD;  Location: [facility] ENDOSCOPY;  Service: Endoscopy;  Laterality: N/A;   KNEE SURGERY Right    NM MYOVIEW LTD  November 2011   No ischemia or infarction. EF 50-55% ( no improvement from 2009 Myoview EF of 28%   RIGHT HEART CATH N/A 04/28/2022   Procedure: RIGHT HEART CATH;  Surgeon: Yvonne Kendall, MD;  Location: [facility] INVASIVE CV LAB;  Service: Cardiovascular;  Laterality: N/A;   TEE WITHOUT CARDIOVERSION N/A 08/16/2014   Procedure: TRANSESOPHAGEAL ECHOCARDIOGRAM (TEE);  Surgeon: Antonieta Iba, MD;  Location: [facility] ORS;  Service: Cardiovascular;  Laterality: N/A;   TEE WITHOUT CARDIOVERSION N/A 03/05/2021   Procedure: TRANSESOPHAGEAL ECHOCARDIOGRAM (TEE);  Surgeon: Debbe Odea, MD;  Location: [facility] ORS;  Service:  Cardiovascular;  Laterality: N/A;   TEE WITHOUT CARDIOVERSION N/A 05/01/2022   Procedure: TRANSESOPHAGEAL ECHOCARDIOGRAM;  Surgeon: Laurey Morale, MD;  Location: [facility] ORS;  Service: Cardiovascular;  Laterality: N/A;  TEE with cardioversion    Family History: Family History  Problem Relation Age of Onset   Hypertension Mother    Hyperlipidemia Mother    Diabetes Mother     Social History: Social History   Socioeconomic History   Marital status: Single    Spouse name: Not on file   Number of children: Not on file   Years of education: Not on file   Highest education level: Not on file  Occupational History   Not on file  Tobacco Use   Smoking status: Former    Current packs/day: 0.00    Average packs/day: 1 pack/day for 12.0 years (12.0 ttl pk-yrs)    Types: Cigarettes    Start date: 02/23/1986    Quit date: 02/23/1998    Years since quitting: 24.9   Smokeless tobacco: Never  Vaping Use   Vaping status: Never Used  Substance and Sexual Activity   Alcohol use: Not Currently    Alcohol/week: 0.0 standard drinks of alcohol    Comment: Past heavy drinker   Drug use: No   Sexual activity: Not Currently  Other Topics Concern   Not on file  Social History Narrative   Not on file   Social Determinants of Health   Financial Resource Strain: Low Risk  (01/04/2019)   Overall Financial Resource Strain (CARDIA)    Difficulty of Paying Living Expenses: Not hard at all  Food Insecurity: No Food Insecurity (01/17/2023)   Hunger Vital Sign    Worried About Running Out of Food in the Last Year: Never true    Ran Out of Food in the Last Year: Never true  Transportation Needs: No Transportation Needs (01/17/2023)   PRAPARE - Administrator, Civil Service (Medical): No    Lack of Transportation (Non-Medical): No  Physical Activity: Inactive (01/04/2019)   Exercise Vital Sign    Days of Exercise per Week: 0 days    Minutes of Exercise per Session: 0 min  Stress: No  Stress Concern Present (01/04/2019)   [employer] of Occupational Health - Occupational Stress Questionnaire    Feeling of Stress : Not at all  Social Connections: Moderately Isolated (01/04/2019)   Social Connection and Isolation Panel [NHANES]    Frequency of Communication with Friends and Family: More than three  times a week    Frequency of Social Gatherings with Friends and Family: More than three times a week    Attends Religious Services: 1 to 4 times per year    Active Member of [employer] or Organizations: No    Attends Banker Meetings: Never    Marital Status: Never married    Allergies:  Allergies  Allergen Reactions   Esomeprazole Magnesium Other (See Comments)    Suspected interstitial nephritis 2018   Egg-Derived Products Rash    Objective:    Vital Signs:   Temp:  [97.4 F (36.3 C)-98.5 F (36.9 C)] 98.5 F (36.9 C) (11/25 1126) Pulse Rate:  [91-113] 92 (11/25 1126) Resp:  [12-25] 12 (11/25 1126) BP: (90-136)/(71-108) 105/77 (11/25 1126) SpO2:  [91 %-98 %] 93 % (11/25 1126) Weight:  [122.6 kg] 122.6 kg (11/25 0500) Last BM Date : 01/17/23  Weight change: Filed Weights   01/16/23 1046 01/18/23 0500  Weight: 111.1 kg 122.6 kg    Intake/Output:  No intake or output data in the 24 hours ending 01/18/23 1135    Physical Exam    General:  well appearing.  No respiratory difficulty HEENT: normal Neck: supple. JVD ~12 cm. Carotids 2+ bilat; no bruits. No lymphadenopathy or thyromegaly appreciated. Cor: PMI nondisplaced. Regular rate & rhythm. No rubs, gallops or murmurs. Lungs: clear, diminished bases Abdomen: soft, nontender, mildly distended. No hepatosplenomegaly. No bruits or masses. Good bowel sounds. Extremities: no cyanosis, clubbing, rash, edema  Neuro: alert & oriented x 3, cranial nerves grossly intact. moves all 4 extremities w/o difficulty. Affect pleasant.   Telemetry   NSR 90s (Personally reviewed)    EKG    ST 112  LBBB  Labs  Basic Metabolic Panel: Recent Labs  Lab 01/16/23 1133 01/17/23 0546 01/18/23 0801  NA 134* 135 131*  K 3.9 3.7 4.0  CL 96* 96* 94*  CO2 24 25 25   GLUCOSE 259* 195* 179*  BUN 45* 47* 52*  CREATININE 4.04* 4.27* 4.52*  CALCIUM 8.7* 8.7* 8.3*   Liver Function Tests: Recent Labs  Lab 01/16/23 1133  AST 25  ALT 18  ALKPHOS 114  BILITOT 0.9  PROT 7.8  ALBUMIN 3.8   Recent Labs  Lab 01/16/23 1203  LIPASE 29   No results for input(s): "AMMONIA" in the last 168 hours.  CBC: Recent Labs  Lab 01/16/23 1133 01/17/23 0546  WBC 9.3 9.0  NEUTROABS 6.3 6.5  HGB 17.3* 16.2  HCT 53.2* 48.7  MCV 85.8 84.3  PLT 295 258   Cardiac Enzymes: No results for input(s): "CKTOTAL", "CKMB", "CKMBINDEX", "TROPONINI" in the last 168 hours.  BNP: BNP (last 3 results) Recent Labs    06/17/22 1519 09/25/22 2248 01/16/23 1133  BNP 897.4* 284.0* 358.6*   ProBNP (last 3 results) No results for input(s): "PROBNP" in the last 8760 hours.  CBG: No results for input(s): "GLUCAP" in the last 168 hours.  Coagulation Studies: No results for input(s): "LABPROT", "INR" in the last 72 hours.  Imaging   No results found.  Medications:    Current Medications:  allopurinol  50 mg Oral Daily   amiodarone  400 mg Oral Daily   amLODipine  5 mg Oral Daily   apixaban  5 mg Oral BID   atorvastatin  80 mg Oral Daily   empagliflozin  10 mg Oral Daily   ferrous sulfate  325 mg Oral Q breakfast   gabapentin  100 mg Oral BID   isosorbide-hydrALAZINE  1 tablet Oral BID   levothyroxine  125 mcg Oral Q0600   lipase/protease/amylase  36,000 Units Oral TID AC   melatonin  5 mg Oral Once   metoprolol succinate  12.5 mg Oral Daily   sodium chloride flush  3 mL Intravenous Q12H    Infusions:   Patient Profile   Hector [name] is a 57 y.o. male with chronic combined systolic and diastolic congestive heart failure, Hx of ETOH use, DM2, morbid obesity, PAF (h/o GIB on apixaban),   OSA on CPAP, CKD stage IV (Scr ~3.3), medication noncompliance. AHF team to see for acute on chronic combined systolic and diastolic congestive heart failure.  Assessment/Plan  Acute on chronic combined systolic and diastolic congestive heart failure - Onset 2009 -LHC 2016 minimal CAD -Echo10/23. LVEF<20% RV moderately down moderate to severe MR -Has had multiple hospitalizations for heart failure exacerbation likely secondary to noncompliance and end-stage CM - Patient was previously deemed not to be ICD insertion by EP due to noncompliance - Etiology of LV dysfunction remains unclear but may have a component of tachy-CM with AF. Has been seen by EP and not felt to be candidate for ablation.  - Echo 3/24 EF < 20% Moderate RV dysfunction - RHC 3/24 RA 17 PA 58/39 (43) PCWP 33 Fick 3.4/1.5 (PA sat 49%) PAPi 1.2  - NYHA II-IIIa on admission, volume appears mildly elevated - Follows with [facility] nephrology, last saw last week. No changes to home diuretic regimen. He reports good UOP. Will add 80 IV lasix today and follow diuresis. May need more, hopefully SCr improved with diuresis - Continue Jardiance 10 mg daily - No ACE inhibitor/ARB/ARNI/MRA secondary to stage IV CKD - No b-blocker with hx of low-output and while he diuresis - Continue BIDIL for BP control, will increase if BP rises - He has end-stage HF with poor insight into his illness.  - Not a candidate for advanced therapies with renal disease and noncompliance.  - Strict I&O, daily weights  Abdominal pain - per primary - cholelithiasis?  - mostly resolved  CKD stage 4a - Follows with nephrology at [facility], recently last saw last week. No changed were made to home diuretic regimen - SCr up to 4.52 today, baseline appears to be around 2.8-3.5 - Follow with diuresis - Avoid hypotension, will not decrease too much to allow for renal perfusion   PAF - In NSR today - has previously seen EP, not a candidate for an ablation - Continue  amiodarone 400 mg daily - Continue Eliquis 5 mg BID  HTN  - Currently stable - Stop amlodipine and carvedilol - Continue BIDIL, can increase if BP continues to rise. Typically only takes once dialy, long term issue  Obesity  - Body mass index is 37.7 kg/m.   OSA  - reports noncompliance with CPAP at home - wearing it here, stressed importance of being compliant with it at home  Hypothyroid - synthroid increased - per primary  Length of Stay: 1  Alen Bleacher, NP  01/18/2023, 11:35 AM  Advanced Heart Failure Team Pager [phone] (M-F; 7a - 5p)  Please contact [facility] for night-coverage after hours (4p -7a ) and weekends on [email or web address]   Patient seen with NP, agree with the above note.   History as noted above.  Admitted with abdominal pain for several days prior to admission.  He is noted to be in atypical atrial flutter with 2:1 block, HR generally running in 100s.  Denies dyspnea currently. Creatinine higher  than baseline at 4.52.   Poor compliance with medical regimen at home.   General: NAD Neck: JVP 10-12 cm, no thyromegaly or thyroid nodule.  Lungs: Clear to auscultation bilaterally with normal respiratory effort. CV: Nondisplaced PMI.  Heart mildly tachy, regular S1/S2, no S3/S4, no murmur.  No peripheral edema.  No carotid bruit.  Difficult to palpate pedal pulses.  Abdomen: Soft, nontender, no hepatosplenomegaly, no distention.  Skin: Intact without lesions or rashes.  Neurologic: Alert and oriented x 3.  Psych: Normal affect. Extremities: No clubbing or cyanosis.  HEENT: Normal.   1. Acute on chronic systolic CHF: Nonischemic cardiomyopathy, cath in 2016 with minimal CAD.  Cardiomyopathy since 2009.  TEE in 3/24 showed EF < 20%, mild LV dilation, moderate RV dysfunction, moderate MR.  On exam, he is mildly volume overloaded but creatinine is above baseline at 4.5. Exacerbation may have been triggered by onset of atypical atrial flutter.  - Lasix 80 mg IV x 1  then back to po tomorrow.  - Continue Jardiance 10 mg daily.  - Continue Bidil 1 tab tid.  - Hold off on beta blocker for now with low output HF.   - End stage HF, low output on RHC In 3/24, poor compliance with medication regimen and followup.  Therefore, not candidate for advanced therapies and also deemed not candidate for ICD.  2. Atrial arrhythmias: H/o paroxysmal AF. He is actually in atypical atrial flutter today (reviewed with EP) and has been in flutter since admission.  This may have triggered exacerbation.   - Continue amiodarone 400 mg daily for now.  - Continue Eliquis.   - Will arrange for TEE-guided DCCV tomorrow (unsure of compliance with Eliquis at home).  Discussed risks/benefits with patient and he agrees to procedure.  3. Abdominal pain: Resolved.  Etiology uncertain.  4. AKI on CKD stage IV: Creatinine 4.5, up a bit from yesterday.  Still with some volume overload on exam.  - Will give 1 dose IV Lasix today.  - Cardioversion tomorrow may help forward output.  - Avoid hypotension, stop amlodipine.  5. OSA: CPAP 6. HTN: Use Bidil for now, hold amlodipine and Coreg.  BP not elevated.  Avoid hypotension with CKD.   Marca Ancona 01/18/2023 4:06 PM

## 2023-01-18 NOTE — Progress Notes (Signed)
[facility]  ROUNDING NOTE   Subjective:   Hector [name] is a 57 y.o. male with past medical conditions including hypertension, paroxysmal A-fib on Eliquis, CHF with a EF less than 20%, and chronic kidney disease stage IV. Patient presents to the emergency department with abdominal distension. He has been admitted for Generalized abdominal pain [R10.84] Inappropriate sinus tachycardia (HCC) [I47.11] AKI (acute kidney injury) (HCC) [N17.9] Abdominal pain [R10.9]  Patient is known to our practice from previous admissions. He currently follows with [facility] nephrology. He recently had an appt with nephrology. He states he has noticed abdominal distension for the past week or so. Mild shortness of breath on exertion. He states he was given furosemide recently. States shortness of breath has improved, on room air. No lower extremity edema.   Creatinine on ED arrival 4.04, increased to 4.52. Baseline creatinine 3.35 on 12/24/22.  Chest xray negative for acute finding. Ct abd/pelvis negative for abd findings.   We have been consulted to evaluate acute kidney injury.   Objective:  Vital signs in last 24 hours:  Temp:  [97.9 F (36.6 C)-98.5 F (36.9 C)] 98.4 F (36.9 C) (11/25 1517) Pulse Rate:  [91-97] 97 (11/25 1517) Resp:  [12-18] 18 (11/25 1517) BP: (90-119)/(71-103) 106/81 (11/25 1517) SpO2:  [92 %-96 %] 92 % (11/25 1517) Weight:  [122.6 kg] 122.6 kg (11/25 0500)  Weight change: 11.5 kg Filed Weights   01/16/23 1046 01/18/23 0500  Weight: 111.1 kg 122.6 kg    Intake/Output: I/O last 3 completed shifts: In: -  Out: 500 [Urine:500]   Intake/Output this shift:  Total I/O In: -  Out: 400 [Urine:400]  Physical Exam: General: NAD  Head: Normocephalic, atraumatic. Moist oral mucosal membranes  Eyes: Anicteric  Lungs:  Clear to auscultation, room air  Heart: Regular rate and rhythm  Abdomen:  Soft, nontender, distention  Extremities:  no peripheral edema.   Neurologic: Alert, moving all four extremities  Skin: No lesions       Basic Metabolic Panel: Recent Labs  Lab 01/16/23 1133 01/17/23 0546 01/18/23 0801  NA 134* 135 131*  K 3.9 3.7 4.0  CL 96* 96* 94*  CO2 24 25 25   GLUCOSE 259* 195* 179*  BUN 45* 47* 52*  CREATININE 4.04* 4.27* 4.52*  CALCIUM 8.7* 8.7* 8.3*    Liver Function Tests: Recent Labs  Lab 01/16/23 1133  AST 25  ALT 18  ALKPHOS 114  BILITOT 0.9  PROT 7.8  ALBUMIN 3.8   Recent Labs  Lab 01/16/23 1203  LIPASE 29   No results for input(s): "AMMONIA" in the last 168 hours.  CBC: Recent Labs  Lab 01/16/23 1133 01/17/23 0546  WBC 9.3 9.0  NEUTROABS 6.3 6.5  HGB 17.3* 16.2  HCT 53.2* 48.7  MCV 85.8 84.3  PLT 295 258    Cardiac Enzymes: No results for input(s): "CKTOTAL", "CKMB", "CKMBINDEX", "TROPONINI" in the last 168 hours.  BNP: Invalid input(s): "POCBNP"  CBG: No results for input(s): "GLUCAP" in the last 168 hours.  Microbiology: Results for orders placed or performed during the hospital encounter of 09/20/22  Urine Culture     Status: Abnormal   Collection Time: 09/20/22  1:33 PM   Specimen: Urine, Clean Catch  Result Value Ref Range Status   Specimen Description   Final    URINE, CLEAN CATCH Performed at [facility], [address], [place], [place] [address]    Special Requests   Final    NONE Performed  at [facility], [address] [place], [place] [address]    Culture MULTIPLE SPECIES PRESENT, SUGGEST RECOLLECTION (A)  Final   Report Status 09/21/2022 FINAL  Final  Chlamydia/NGC rt PCR ([facility] only)     Status: None   Collection Time: 09/20/22  1:33 PM   Specimen: Urine  Result Value Ref Range Status   Specimen source GC/Chlam URINE, RANDOM  Final   Chlamydia Tr NOT DETECTED NOT DETECTED Final   N gonorrhoeae NOT DETECTED NOT DETECTED Final    Comment: (NOTE) This CT/NG assay has not been evaluated in patients with a history of   hysterectomy. Performed at [facility], [address] [place], [place] [address]     Coagulation Studies: No results for input(s): "LABPROT", "INR" in the last 72 hours.  Urinalysis: No results for input(s): "COLORURINE", "LABSPEC", "PHURINE", "GLUCOSEU", "HGBUR", "BILIRUBINUR", "KETONESUR", "PROTEINUR", "UROBILINOGEN", "NITRITE", "LEUKOCYTESUR" in the last 72 hours.  Invalid input(s): "APPERANCEUR"    Imaging: No results found.   Medications:     allopurinol  50 mg Oral Daily   amiodarone  400 mg Oral Daily   apixaban  5 mg Oral BID   atorvastatin  80 mg Oral Daily   empagliflozin  10 mg Oral Daily   ferrous sulfate  325 mg Oral Q breakfast   gabapentin  100 mg Oral BID   isosorbide-hydrALAZINE  1 tablet Oral TID   levothyroxine  125 mcg Oral Q0600   lipase/protease/amylase  36,000 Units Oral TID AC   melatonin  5 mg Oral Once   sodium chloride flush  3 mL Intravenous Q12H   acetaminophen **OR** acetaminophen, morphine injection, ondansetron **OR** ondansetron (ZOFRAN) IV, oxyCODONE, polyethylene glycol  Assessment/ Plan:  [name] is a 57 y.o.  male with past medical conditions including hypertension, paroxysmal A-fib on Eliquis, CHF with a EF less than 20%, and chronic kidney disease stage IV. Patient presents to the emergency department with abdominal distension. He has been admitted for Generalized abdominal pain [R10.84] Inappropriate sinus tachycardia (HCC) [I47.11] AKI (acute kidney injury) (HCC) [N17.9] Abdominal pain [R10.9]   Acute Kidney Injury on chronic kidney disease stage IV with baseline creatinine 3.35 and GFR of 20 on 12/24/22.  Acute kidney injury etiology unclear at this time.  CT abd/pelvis negative for hydronephrosis. Creatinine 4.04 on admission. Agree with holding diuretics and other nephrotoxic agents. No acute indication for dialysis.   Lab Results  Component Value Date   CREATININE 4.52 (H) 01/18/2023    CREATININE 4.27 (H) 01/17/2023   CREATININE 4.04 (H) 01/16/2023    Intake/Output Summary (Last 24 hours) at 01/18/2023 1638 Last data filed at 01/18/2023 1500 Gross per 24 hour  Intake --  Output 400 ml  Net -400 ml   2. Anemia of chronic kidney disease Lab Results  Component Value Date   HGB 16.2 01/17/2023    Will continue to monitor.   3. Chronic systolic and diastolic heart failure, ECHO from 12/08/2021 shows EF less than 20% with a moderate to severe MVR    LOS: 1 Shaiann Mcmanamon 11/25/20244:38 PM

## 2023-01-18 NOTE — Progress Notes (Signed)
Progress Note   Patient: Hector [name] BJY:[id number] DOB: 08/21/1965 DOA: 01/16/2023     1 DOS: the patient was seen and examined on 01/18/2023   Brief hospital course: 57 year old man with past medical history of heart failure with reduced ejection fraction with EF less than 20%, nonischemic cardiomyopathy, history of alcohol use, paroxysmal atrial fibrillation/flutter on Eliquis, struct of sleep apnea on CPAP, chronic kidney disease stage IV, CVA, hypertension, hyperlipidemia, type 2 diabetes mellitus.  Patient presents with abdominal pain and found to be tachycardic.  CT scan of the abdomen without contrast did not find any findings in the abdomen or pelvis to explain the patient's abdominal pain, cholelithiasis, renal cysts.  11/24.  Patient still with quite a bit of abdominal pain.  Started pain medications and added gabapentin low-dose.  With creatinine a little bit higher will give a fluid bolus and hold water pills today.  Assessment and Plan: * Abdominal pain CT scan does not show any etiology of abdominal pain but this was done without contrast secondary to patient's creatinine.  Seems to be doing better on low-dose gabapentin  Acute kidney injury superimposed on CKD (HCC) AKI on CKD stage IV.  Today's creatinine up at 4.52.  Holding water pills today.  Continue to monitor.  Appreciate nephrology consultation.  Inappropriate sinus tachycardia (HCC) Improved with increased dose of amiodarone and Toprol-XL.  Hypothyroidism With TSH being 23, continue increased levothyroxine.  Chronic systolic CHF (congestive heart failure) (HCC) Patient has a history of HFrEF with last EF less than 20% in the setting of alcohol induced nonischemic cardiomyopathy.  Continue low-dose Toprol, patient on Jardiance and BiDil.  Holding Zaroxolyn and [employer] today.  No ACE or ARB secondary to increasing creatinine.  Polycythemia Likely from sleep apnea  OSA (obstructive sleep apnea) Continue  home CPAP  Type II diabetes mellitus with renal manifestations (HCC) - A1c 7.7 - Continue home Jardiance - SSI, moderate  Essential hypertension Blood pressure very elevated on presentation and on the lower side now.  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        Subjective: Patient still having some abdominal pain.  Better than yesterday after starting pain medications and gabapentin.  Patient's creatinine is a little bit higher today.  Appreciate nephrology and heart failure specialist consultation.  Physical Exam: Vitals:   01/18/23 0419 01/18/23 0500 01/18/23 0743 01/18/23 1126  BP: 102/84  (!) 119/103 105/77  Pulse: 93  96 92  Resp: 18  18 12   Temp: 97.9 F (36.6 C)  98.1 F (36.7 C) 98.5 F (36.9 C)  TempSrc:   Oral Oral  SpO2: 96%  93% 93%  Weight:  122.6 kg    Height:       Physical Exam HENT:     Head: Normocephalic.     Mouth/Throat:     Pharynx: No oropharyngeal exudate.  Eyes:     General: Lids are normal.     Conjunctiva/sclera: Conjunctivae normal.  Cardiovascular:     Rate and Rhythm: Normal rate and regular rhythm.     Heart sounds: Normal heart sounds, S1 normal and S2 normal.  Pulmonary:     Breath sounds: No decreased breath sounds, wheezing, rhonchi or rales.  Abdominal:     Palpations: Abdomen is soft.     Tenderness: There is generalized abdominal tenderness.  Musculoskeletal:     Right lower leg: No swelling.     Left  lower leg: No swelling.  Skin:    General: Skin is warm.     Findings: No rash.  Neurological:     Mental Status: He is alert and oriented to person, place, and time.     Data Reviewed: Sodium 131, creatinine 4.52  Disposition: Status is: Inpatient Remains inpatient appropriate because: Monitor today.  Reassess tomorrow.  Recheck creatinine tomorrow  Planned Discharge Destination: Home    Time spent: 28 minutes  Author: Alford Highland, MD 01/18/2023 1:53 PM  For on call review [email or web address].

## 2023-01-18 NOTE — Progress Notes (Addendum)
Dr. Shirlee Latch discussed with EP MD. Patient in atypical atrial flutter. Order placed for TEE/DCCV tomorrow.   Brynda Peon, AGACNP-BC  Advanced Heart Failure Team  01/18/23

## 2023-01-19 ENCOUNTER — Encounter
Admission: EM | Disposition: A | Discharge status: Home / Self Care | Payer: Self-pay | Source: Home / Self Care | Attending: Internal Medicine

## 2023-01-19 ENCOUNTER — Inpatient Hospital Stay: Payer: Medicaid Other

## 2023-01-19 ENCOUNTER — Other Ambulatory Visit (HOSPITAL_COMMUNITY): Payer: Self-pay

## 2023-01-19 ENCOUNTER — Inpatient Hospital Stay (HOSPITAL_COMMUNITY)
Admit: 2023-01-19 | Discharge: 2023-01-19 | Disposition: A | Discharge status: Home / Self Care | Payer: Medicaid Other | Attending: Internal Medicine | Admitting: Internal Medicine

## 2023-01-19 ENCOUNTER — Encounter: Payer: Self-pay | Admitting: Internal Medicine

## 2023-01-19 DIAGNOSIS — I340 Nonrheumatic mitral (valve) insufficiency: Secondary | ICD-10-CM

## 2023-01-19 DIAGNOSIS — N179 Acute kidney failure, unspecified: Secondary | ICD-10-CM | POA: Diagnosis not present

## 2023-01-19 DIAGNOSIS — I5022 Chronic systolic (congestive) heart failure: Secondary | ICD-10-CM | POA: Diagnosis not present

## 2023-01-19 DIAGNOSIS — I4892 Unspecified atrial flutter: Secondary | ICD-10-CM

## 2023-01-19 DIAGNOSIS — R1084 Generalized abdominal pain: Secondary | ICD-10-CM | POA: Diagnosis not present

## 2023-01-19 DIAGNOSIS — I484 Atypical atrial flutter: Secondary | ICD-10-CM | POA: Diagnosis not present

## 2023-01-19 DIAGNOSIS — I483 Typical atrial flutter: Secondary | ICD-10-CM

## 2023-01-19 DIAGNOSIS — I5043 Acute on chronic combined systolic (congestive) and diastolic (congestive) heart failure: Secondary | ICD-10-CM | POA: Diagnosis not present

## 2023-01-19 HISTORY — PX: CARDIOVERSION: SHX1299

## 2023-01-19 HISTORY — PX: TEE WITHOUT CARDIOVERSION: SHX5443

## 2023-01-19 LAB — COMPREHENSIVE METABOLIC PANEL
ALT: 15 U/L (ref 0–44)
AST: 20 U/L (ref 15–41)
Albumin: 3.3 g/dL — ABNORMAL LOW (ref 3.5–5.0)
Alkaline Phosphatase: 86 U/L (ref 38–126)
Anion gap: 10 (ref 5–15)
BUN: 60 mg/dL — ABNORMAL HIGH (ref 6–20)
CO2: 25 mmol/L (ref 22–32)
Calcium: 7.7 mg/dL — ABNORMAL LOW (ref 8.9–10.3)
Chloride: 99 mmol/L (ref 98–111)
Creatinine, Ser: 4.53 mg/dL — ABNORMAL HIGH (ref 0.61–1.24)
GFR, Estimated: 14 mL/min — ABNORMAL LOW (ref >60)
Glucose, Bld: 189 mg/dL — ABNORMAL HIGH (ref 70–99)
Potassium: 4 mmol/L (ref 3.5–5.1)
Sodium: 134 mmol/L — ABNORMAL LOW (ref 135–145)
Total Bilirubin: 0.8 mg/dL (ref <1.2)
Total Protein: 6.5 g/dL (ref 6.5–8.1)

## 2023-01-19 LAB — URINALYSIS, COMPLETE (UACMP) WITH MICROSCOPIC
Bacteria, UA: NONE SEEN
Bilirubin Urine: NEGATIVE
Glucose, UA: >=500 mg/dL — AB
Ketones, ur: NEGATIVE mg/dL
Nitrite: NEGATIVE
Protein, ur: 100 mg/dL — AB
Specific Gravity, Urine: 1.009 (ref 1.005–1.030)
pH: 6 (ref 5.0–8.0)

## 2023-01-19 LAB — ECHO TEE: Est EF: <20

## 2023-01-19 SURGERY — ECHOCARDIOGRAM, TRANSESOPHAGEAL
Anesthesia: General

## 2023-01-19 MED ORDER — ISOSORB DINITRATE-HYDRALAZINE 20-37.5 MG PO TABS
0.5000 | ORAL_TABLET | Freq: Three times a day (TID) | ORAL | Status: DC
Start: 2023-01-19 — End: 2023-01-19
  Administered 2023-01-19: 0.5 via ORAL
  Filled 2023-01-19 (×2): qty 0.5

## 2023-01-19 MED ORDER — INSULIN ASPART 100 UNIT/ML IJ SOLN: 0.0000 [IU] | Freq: Every day | INTRAMUSCULAR | Status: DC

## 2023-01-19 MED ORDER — LEVOTHYROXINE SODIUM 125 MCG PO TABS: 125.0000 ug | ORAL_TABLET | Freq: Every day | ORAL | 0 refills | Status: DC

## 2023-01-19 MED ORDER — GABAPENTIN 100 MG PO CAPS: 100.0000 mg | ORAL_CAPSULE | Freq: Two times a day (BID) | ORAL | 0 refills | Status: DC

## 2023-01-19 MED ORDER — AMIODARONE HCL 200 MG PO TABS: ORAL_TABLET | ORAL | 0 refills | Status: DC

## 2023-01-19 MED ORDER — PHENYLEPHRINE 80 MCG/ML (10ML) SYRINGE FOR IV PUSH (FOR BLOOD PRESSURE SUPPORT)
PREFILLED_SYRINGE | INTRAVENOUS | Status: DC | PRN
  Administered 2023-01-19: 80 ug via INTRAVENOUS
  Administered 2023-01-19 (×2): 160 ug via INTRAVENOUS

## 2023-01-19 MED ORDER — PANCRELIPASE (LIP-PROT-AMYL) 36000-114000 UNITS PO CPEP: 36000.0000 [IU] | ORAL_CAPSULE | Freq: Three times a day (TID) | ORAL | 0 refills | Status: AC

## 2023-01-19 MED ORDER — SODIUM CHLORIDE 0.9 % IV SOLN: INTRAVENOUS | Status: DC

## 2023-01-19 MED ORDER — PROPOFOL 10 MG/ML IV BOLUS
INTRAVENOUS | Status: DC | PRN
  Administered 2023-01-19 (×2): 20 mg via INTRAVENOUS
  Administered 2023-01-19: 30 mg via INTRAVENOUS

## 2023-01-19 MED ORDER — INSULIN ASPART 100 UNIT/ML IJ SOLN: 0.0000 [IU] | Freq: Three times a day (TID) | INTRAMUSCULAR | Status: DC

## 2023-01-19 MED ORDER — OXYCODONE HCL 5 MG PO TABS: 2.5000 mg | ORAL_TABLET | Freq: Four times a day (QID) | ORAL | 0 refills | Status: DC | PRN

## 2023-01-19 MED ORDER — BUTAMBEN-TETRACAINE-BENZOCAINE 2-2-14 % EX AERO
INHALATION_SPRAY | CUTANEOUS | Status: AC
  Filled 2023-01-19: qty 5

## 2023-01-19 MED ORDER — PROPOFOL 1000 MG/100ML IV EMUL
INTRAVENOUS | Status: AC
  Filled 2023-01-19: qty 100

## 2023-01-19 MED ORDER — LIDOCAINE VISCOUS HCL 2 % MT SOLN
OROMUCOSAL | Status: AC
  Filled 2023-01-19: qty 15

## 2023-01-19 MED ORDER — SODIUM CHLORIDE FLUSH 0.9 % IV SOLN
INTRAVENOUS | Status: AC
  Filled 2023-01-19: qty 10

## 2023-01-19 MED ORDER — APIXABAN 5 MG PO TABS: 5.0000 mg | ORAL_TABLET | Freq: Two times a day (BID) | ORAL | 0 refills | Status: DC

## 2023-01-19 MED ORDER — POLYETHYLENE GLYCOL 3350 17 G PO PACK: 17.0000 g | PACK | Freq: Every day | ORAL | 0 refills | Status: AC | PRN

## 2023-01-19 MED ORDER — TORSEMIDE 20 MG PO TABS
40.0000 mg | ORAL_TABLET | Freq: Every day | ORAL | Status: DC
  Administered 2023-01-19: 40 mg via ORAL
  Filled 2023-01-19: qty 2

## 2023-01-19 NOTE — Progress Notes (Addendum)
Advanced Heart Failure Rounding Note  PCP-Cardiologist: Julien Nordmann, MD   Subjective:    Diuresed ~1.5 L UOP yesterday. SCr remains above baseline at 4.5  NPO for TEE/DCCV today  Objective:   Weight Range: 120.7 kg Body mass index is 37.11 kg/m.   Vital Signs:   Temp:  [98.1 F (36.7 C)-98.8 F (37.1 C)] 98.8 F (37.1 C) (11/26 0341) Pulse Rate:  [90-100] 90 (11/26 0341) Resp:  [12-18] 17 (11/26 0341) BP: (83-119)/(64-103) 89/68 (11/26 0341) SpO2:  [92 %-97 %] 97 % (11/26 0341) Weight:  [120.7 kg] 120.7 kg (11/26 0419) Last BM Date : 01/17/23  Weight change: Filed Weights   01/16/23 1046 01/18/23 0500 01/19/23 0419  Weight: 111.1 kg 122.6 kg 120.7 kg    Intake/Output:   Intake/Output Summary (Last 24 hours) at 01/19/2023 0717 Last data filed at 01/19/2023 0347 Gross per 24 hour  Intake 20 ml  Output 1500 ml  Net -1480 ml      Physical Exam  General:  well appearing.  No respiratory difficulty HEENT: normal Neck: supple. JVD ~9 cm. Carotids 2+ bilat; no bruits. No lymphadenopathy or thyromegaly appreciated. Cor: PMI nondisplaced. Regular rate & rhythm. No rubs, gallops or murmurs. Lungs: clear Abdomen: soft, nontender, nondistended. No hepatosplenomegaly. No bruits or masses. Good bowel sounds. Extremities: no cyanosis, clubbing, rash, edema  Neuro: alert & oriented x 3, cranial nerves grossly intact. moves all 4 extremities w/o difficulty. Affect pleasant.   Telemetry   Atypical flutter 90s (Personally reviewed)    EKG   Atypical flutter   Labs    CBC Recent Labs    01/16/23 1133 01/17/23 0546  WBC 9.3 9.0  NEUTROABS 6.3 6.5  HGB 17.3* 16.2  HCT 53.2* 48.7  MCV 85.8 84.3  PLT 295 258   Basic Metabolic Panel Recent Labs    13/24/40 0801 01/19/23 0409  NA 131* 134*  K 4.0 4.0  CL 94* 99  CO2 25 25  GLUCOSE 179* 189*  BUN 52* 60*  CREATININE 4.52* 4.53*  CALCIUM 8.3* 7.7*   Liver Function Tests Recent Labs     01/16/23 1133 01/19/23 0409  AST 25 20  ALT 18 15  ALKPHOS 114 86  BILITOT 0.9 0.8  PROT 7.8 6.5  ALBUMIN 3.8 3.3*   Recent Labs    01/16/23 1203  LIPASE 29   Cardiac Enzymes No results for input(s): "CKTOTAL", "CKMB", "CKMBINDEX", "TROPONINI" in the last 72 hours.  BNP: BNP (last 3 results) Recent Labs    06/17/22 1519 09/25/22 2248 01/16/23 1133  BNP 897.4* 284.0* 358.6*    ProBNP (last 3 results) No results for input(s): "PROBNP" in the last 8760 hours.   D-Dimer No results for input(s): "DDIMER" in the last 72 hours. Hemoglobin A1C Recent Labs    01/17/23 0546  HGBA1C 7.7*   Fasting Lipid Panel No results for input(s): "CHOL", "HDL", "LDLCALC", "TRIG", "CHOLHDL", "LDLDIRECT" in the last 72 hours. Thyroid Function Tests Recent Labs    01/16/23 1333  TSH 23.233*    Other results:   Imaging    No results found.   Medications:     Scheduled Medications:  allopurinol  50 mg Oral Daily   amiodarone  400 mg Oral Daily   apixaban  5 mg Oral BID   atorvastatin  80 mg Oral Daily   empagliflozin  10 mg Oral Daily   ferrous sulfate  325 mg Oral Q breakfast   gabapentin  100 mg Oral BID  isosorbide-hydrALAZINE  1 tablet Oral TID   levothyroxine  125 mcg Oral Q0600   lipase/protease/amylase  36,000 Units Oral TID AC   melatonin  5 mg Oral Once   sodium chloride flush  3 mL Intravenous Q12H    Infusions:  sodium chloride 10 mL/hr at 01/19/23 0347    PRN Medications: acetaminophen **OR** acetaminophen, morphine injection, ondansetron **OR** ondansetron (ZOFRAN) IV, oxyCODONE, polyethylene glycol    Patient Profile   [name] is a 57 y.o. male with chronic combined systolic and diastolic congestive heart failure, Hx of ETOH use, DM2, morbid obesity, PAF (h/o GIB on apixaban),  OSA on CPAP, CKD stage IV (Scr ~3.3), medication noncompliance. AHF team to see for acute on chronic combined systolic and diastolic congestive heart failure.    Assessment/Plan   1. Acute on chronic systolic CHF: Nonischemic cardiomyopathy, cath in 2016 with minimal CAD.  Cardiomyopathy since 2009.  TEE in 3/24 showed EF < 20%, mild LV dilation, moderate RV dysfunction, moderate MR.  On exam, he is mildly volume overloaded but creatinine is above baseline at 4.5. Exacerbation may have been triggered by onset of atypical atrial flutter.  - Restart torsemide 40 mg daily today - Continue Jardiance 10 mg daily.  - Continue Bidil 1 tab tid.  - Hold off on beta blocker for now with low output HF.   - End stage HF, low output on RHC In 3/24, poor compliance with medication regimen and followup.  Therefore, not candidate for advanced therapies and also deemed not candidate for ICD.  2. Atrial arrhythmias: H/o paroxysmal AF. He is actually in atypical atrial flutter today (reviewed with EP) and has been in flutter since admission.  This may have triggered exacerbation.   - Continue amiodarone 400 mg daily for now.  - Continue Eliquis.   - TEE-guided DCCV today (unsure of compliance with Eliquis at home).  Discussed risks/benefits with patient and he agrees to procedure.  3. Abdominal pain: Resolved.  Etiology uncertain.  4. AKI on CKD stage IV: Creatinine 4.5, up a bit from yesterday.  Still with some volume overload on exam.  - Back to PO diuretics today - Cardioversion may help forward output.  - Avoid hypotension, stop amlodipine.  5. OSA: CPAP 6. HTN: Use Bidil for now, hold amlodipine and Coreg.  BP not elevated.  Avoid hypotension with CKD. BP soft may need to cut back bidil if persistently low  Length of Stay: 2  Alen Bleacher, NP  01/19/2023, 7:17 AM  Advanced Heart Failure Team Pager [phone] (M-F; 7a - 5p)  Please contact [facility] for night-coverage after hours (5p -7a ) and weekends on [email or web address]   Patient seen with NP, agree with the above note.   This morning, had TEE-guided DCCV from atypical atrial flutter to NSR in 50s.    He  some diuresis yesterday with IV Lasix, weight down.  Creatinine stable at 4.5.   General: NAD Neck: JVP 8 cm, no thyromegaly or thyroid nodule.  Lungs: Clear to auscultation bilaterally with normal respiratory effort. CV: Nondisplaced PMI.  Heart regular S1/S2, no S3/S4, no murmur.  No peripheral edema.   Abdomen: Soft, nontender, no hepatosplenomegaly, no distention.  Skin: Intact without lesions or rashes.  Neurologic: Alert and oriented x 3.  Psych: Normal affect. Extremities: No clubbing or cyanosis.  HEENT: Normal.   Continue Eliquis and amiodarone 400 mg daily to keep in NSR.  Will continue 400 mg amiodarone for another week then decrease back to  200 mg daily.   Probably mildly volume overloaded but creatinine is up from baseline at 4.5 (stable from yesterday).  Agree with transition back to home torsemide 40 mg daily.  I will decrease Bidil to 0.5 tab tid for now with soft BP.   Marca Ancona 01/19/2023 8:16 AM

## 2023-01-19 NOTE — Procedures (Signed)
Electrical Cardioversion Procedure Note BRYANT DOVE [id number] 04/28/1965  Procedure: Electrical Cardioversion Indications:  Atrial Flutter  Procedure Details Consent: Risks of procedure as well as the alternatives and risks of each were explained to the (patient/caregiver).  Consent for procedure obtained. Time Out: Verified patient identification, verified procedure, site/side was marked, verified correct patient position, special equipment/implants available, medications/allergies/relevent history reviewed, required imaging and test results available.  Performed  Patient placed on cardiac monitor, pulse oximetry, supplemental oxygen as necessary.  Sedation given:  Propofol per anesthesiology Pacer pads placed anterior and posterior chest.  Cardioverted 1 time(s).  Cardioverted at 150J.  Evaluation Findings: Post procedure EKG shows: NSR Complications: None Patient did tolerate procedure well.   Marca Ancona 01/19/2023, 8:11 AM

## 2023-01-19 NOTE — Transfer of Care (Signed)
Immediate Anesthesia Transfer of Care Note  Patient: Hector [name]  Procedure(s) Performed: TRANSESOPHAGEAL ECHOCARDIOGRAM (TEE) CARDIOVERSION  Patient Location: PACU  Anesthesia Type:MAC  Level of Consciousness: awake  Airway & Oxygen Therapy: Patient Spontanous Breathing and Patient connected to nasal cannula oxygen  Post-op Assessment: Report given to RN and Post -op Vital signs reviewed and stable  Post vital signs: Reviewed and stable  Last Vitals:  Vitals Value Taken Time  BP 92/67 01/19/23 0818  Temp    Pulse 54 01/19/23 0819  Resp 14 01/19/23 0819  SpO2 94 % 01/19/23 0819    Last Pain:  Vitals:   01/19/23 0736  TempSrc:   PainSc: 0-No pain      Patients Stated Pain Goal: 2 (01/18/23 0606)  Complications: No notable events documented.

## 2023-01-19 NOTE — Plan of Care (Signed)
Progressing towards goals

## 2023-01-19 NOTE — Inpatient Diabetes Management (Signed)
Inpatient Diabetes Program Recommendations  AACE/ADA: New Consensus Statement on Inpatient Glycemic Control (2015)  Target Ranges:  Prepandial:   less than 140 mg/dL      Peak postprandial:   less than 180 mg/dL (1-2 hours)      Critically ill patients:  140 - 180 mg/dL   Lab Results  Component Value Date   GLUCAP 101 (H) 04/29/2022   HGBA1C 7.7 (H) 01/17/2023    Review of Glycemic Control  Latest Reference Range & Units 01/18/23 08:01 01/19/23 04:09  Glucose 70 - 99 mg/dL 478 (H) 295 (H)   Diabetes history: DM  Outpatient Diabetes medications:  Jardiance 10 mg daily Current orders for Inpatient glycemic control:  Jardiance 10 mg daily None  Inpatient Diabetes Program Recommendations:    Consider adding Novolog sensitive tid with meals and HS while in the hospital.   Thanks  Beryl Meager, RN, BC-ADM Inpatient Diabetes Coordinator Pager [phone] (8a-5p)

## 2023-01-19 NOTE — Progress Notes (Signed)
Reviewed discharge instructions with patient . Provided AVS. Removed PIV. All questions answered

## 2023-01-19 NOTE — Discharge Summary (Signed)
Physician Discharge Summary   Patient: Hector [name] MRN: [id number] DOB: June 09, 1965  Admit date:     01/16/2023  Discharge date: 01/19/23  Discharge Physician: Alford Highland   PCP: Oswaldo Conroy, MD   Recommendations at discharge:   Follow-up PCP Follow-up CHF clinic Follow-up [facility] nephrology  Discharge Diagnoses: Principal Problem:   Abdominal pain Active Problems:   Atrial flutter (HCC)   Acute kidney injury superimposed on CKD (HCC)   Chronic renal impairment, stage 4 (severe) (HCC)   Hypothyroidism   Chronic systolic CHF (congestive heart failure) (HCC)   OSA (obstructive sleep apnea)   Polycythemia   Type II diabetes mellitus with renal manifestations (HCC)   Essential hypertension   Alcohol use, unspecified, in remission    Hospital Course: 57 year old man with past medical history of heart failure with reduced ejection fraction with EF less than 20%, nonischemic cardiomyopathy, history of alcohol use, paroxysmal atrial fibrillation/flutter on Eliquis, struct of sleep apnea on CPAP, chronic kidney disease stage IV, CVA, hypertension, hyperlipidemia, type 2 diabetes mellitus.  Patient presents with abdominal pain and found to be tachycardic.  CT scan of the abdomen without contrast did not find any findings in the abdomen or pelvis to explain the patient's abdominal pain, cholelithiasis, renal cysts.  11/24.  Patient still with quite a bit of abdominal pain.  Started pain medications and added gabapentin low-dose.  With creatinine a little bit higher will give a fluid bolus and hold water pills today. 11/25.  Consulted heart failure team and nephrology with creatinine being a little higher. 11/26.  Cardiology decided to do a cardioversion for atrial flutter.  Patient converted into normal sinus rhythm.  Continue amiodarone 400 mg daily for 1 week and then down to 200 mg after that.  Continue Eliquis.  Creatinine 4.53 upon discharge with a GFR of 14.   Nephrology okay with patient discharging and following up with [facility] nephrology as outpatient.  Will also follow-up with CHF clinic.  Patient having no further abdominal pain.  Assessment and Plan: * Abdominal pain CT scan does not show any etiology of abdominal pain but this was done without contrast secondary to patient's creatinine.  Seems to be doing better on low-dose gabapentin  Acute kidney injury superimposed on CKD (HCC) AKI on CKD stage IV.  Today's creatinine up at 4.53.  Nephrology okay with discharging and following up with [facility] nephrology as outpatient.  Atrial flutter (HCC) Patient cardioverted today into normal sinus rhythm.  Continue increased dose of amiodarone 400 mg daily for 1 week and then down to 200 mg daily after that.  Hypothyroidism With TSH being 23, continue increased levothyroxine dose.  Chronic systolic CHF (congestive heart failure) (HCC) Patient has a history of HFrEF with last EF less than 20% in the setting of alcohol induced nonischemic cardiomyopathy.  Continue Jardiance and BiDil.  Restarted Demadex today.  No ACE or ARB secondary to increasing creatinine.  Cardiology discontinued Toprol with bradycardia.  Would rather do amiodarone for maintenance of sinus rhythm  Polycythemia Likely from sleep apnea.  OSA (obstructive sleep apnea) Continue home CPAP  Type II diabetes mellitus with renal manifestations (HCC) - A1c 7.7 -Continue Jardiance  Essential hypertension Blood pressure very elevated on presentation and on the lower side now.  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         Consultants: Cardiology, nephrology Procedures performed: Cardioversion Disposition: Home Diet recommendation:  Cardiac and Carb modified diet DISCHARGE MEDICATION: Allergies as of 01/19/2023       Reactions   Esomeprazole Magnesium Other (See Comments)   Suspected interstitial  nephritis 2018   Egg-derived Products Rash        Medication List     STOP taking these medications    acetaminophen 325 MG tablet Commonly known as: TYLENOL   famotidine 20 MG tablet Commonly known as: PEPCID   metolazone 2.5 MG tablet Commonly known as: ZAROXOLYN   ondansetron 4 MG disintegrating tablet Commonly known as: ZOFRAN-ODT       TAKE these medications    allopurinol 100 MG tablet Commonly known as: ZYLOPRIM Take 0.5 tablets (50 mg total) by mouth daily.   amiodarone 200 MG tablet Commonly known as: PACERONE Two tablets po twice a day for one week then one tab daily afterwards What changed:  how much to take how to take this when to take this additional instructions   apixaban 5 MG Tabs tablet Commonly known as: ELIQUIS Take 1 tablet (5 mg total) by mouth 2 (two) times daily.   atorvastatin 80 MG tablet Commonly known as: LIPITOR Take 1 tablet (80 mg total) by mouth daily.   empagliflozin 10 MG Tabs tablet Commonly known as: Jardiance Take 1 tablet (10 mg total) by mouth daily.   ferrous sulfate 325 (65 FE) MG EC tablet Take 325 mg by mouth daily with breakfast.   gabapentin 100 MG capsule Commonly known as: NEURONTIN Take 1 capsule (100 mg total) by mouth 2 (two) times daily.   isosorbide-hydrALAZINE 20-37.5 MG tablet Commonly known as: BiDil Take 0.5 tablets by mouth 3 (three) times daily. What changed:  how much to take when to take this   levothyroxine 125 MCG tablet Commonly known as: SYNTHROID Take 1 tablet (125 mcg total) by mouth daily at 6 (six) AM. Start taking on: January 20, 2023 What changed:  medication strength how much to take when to take this   lipase/protease/amylase 47829 UNITS Cpep capsule Commonly known as: CREON Take 1 capsule (36,000 Units total) by mouth 3 (three) times daily before meals. What changed: when to take this   oxyCODONE 5 MG immediate release tablet Commonly known as: Oxy  IR/ROXICODONE Take 0.5 tablets (2.5 mg total) by mouth every 6 (six) hours as needed for severe pain (pain score 7-10).   polyethylene glycol 17 g packet Commonly known as: MIRALAX / GLYCOLAX Take 17 g by mouth daily as needed for mild constipation.   torsemide 20 MG tablet Commonly known as: DEMADEX Take 40 mg by mouth daily.        Follow-up Information     [facility]. Go in 7 day(s).   Specialty: Cardiology Why: Hospital Follow-Up-01/27/23 @ 2:30 PM Please bring all medications to appt. [facility], [address] Second Floor Free Valet Parking Contact information: [address] [place] [address] [phone]        Oswaldo Conroy, MD Follow up in 5 day(s).   Specialty: Family Medicine Contact information: [address] [place] [address] [phone]         [facility] 2 week(s).                 Discharge Exam: Filed Weights   01/16/23 1046 01/18/23 0500 01/19/23 0419  Weight: 111.1 kg 122.6 kg 120.7 kg   Physical Exam HENT:     Head:  Normocephalic.     Mouth/Throat:     Pharynx: No oropharyngeal exudate.  Eyes:     General: Lids are normal.     Conjunctiva/sclera: Conjunctivae normal.  Cardiovascular:     Rate and Rhythm: Regular rhythm. Bradycardia present.     Heart sounds: Normal heart sounds, S1 normal and S2 normal.  Pulmonary:     Breath sounds: No decreased breath sounds, wheezing, rhonchi or rales.  Abdominal:     Palpations: Abdomen is soft.     Tenderness: There is no abdominal tenderness.  Musculoskeletal:     Right lower leg: No swelling.     Left lower leg: No swelling.  Skin:    General: Skin is warm.     Findings: No rash.  Neurological:     Mental Status: He is alert and oriented to person, place, and time.      Condition at discharge: stable  The results of significant diagnostics from this  hospitalization (including imaging, microbiology, ancillary and laboratory) are listed below for reference.   Imaging Studies: ECHO TEE  Result Date: 01/19/2023    TRANSESOPHOGEAL ECHO REPORT   Patient Name:   Hector [name] Date of Exam: 01/19/2023 Medical Rec #:  [id number]           Height:       71.0 in Accession #:    [id number]          Weight:       266.1 lb Date of Birth:  July 26, 1965           BSA:          2.381 m Patient Age:    57 years            BP:           89/68 mmHg Patient Gender: M                   HR:           90 bpm. Exam Location:  [facility] Procedure: Transesophageal Echo, Color Doppler and Cardiac Doppler Indications:     Not listed on TEE check-in sheet  History:         Patient has prior history of Echocardiogram examinations, most                  recent 12/08/2021. Risk Factors:Hypertension. PAF.  Sonographer:     Cristela Blue Referring Phys:  [id number] ALMA L DIAZ Diagnosing Phys: Wilfred Lacy PROCEDURE: The transesophogeal probe was passed without difficulty through the esophogus of the patient. Sedation performed by different physician. The patient developed no complications during the procedure.  IMPRESSIONS  1. Left ventricular ejection fraction, by estimation, is <20%. The left ventricle has severely decreased function.  2. Right ventricular systolic function is severely reduced. The right ventricular size is normal.  3. Left atrial size was mildly dilated. No left atrial/left atrial appendage thrombus was detected.  4. The mitral valve is normal in structure. Mild mitral valve regurgitation. No evidence of mitral stenosis.  5. The aortic valve is tricuspid. Aortic valve regurgitation is not visualized. No aortic stenosis is present.  6. No PFO/ASD by color doppler. FINDINGS  Left Ventricle: Left ventricular ejection fraction, by estimation, is <20%. The left ventricle has severely decreased function. The left ventricular internal cavity size was normal in size. There is no  left ventricular hypertrophy. Right Ventricle: The right ventricular size is normal. No increase in right ventricular wall thickness.  Right ventricular systolic function is severely reduced. Left Atrium: Left atrial size was mildly dilated. No left atrial/left atrial appendage thrombus was detected. Right Atrium: Right atrial size was normal in size. Pericardium: There is no evidence of pericardial effusion. Mitral Valve: The mitral valve is normal in structure. Mild mitral valve regurgitation. No evidence of mitral valve stenosis. Tricuspid Valve: The tricuspid valve is normal in structure. Tricuspid valve regurgitation is trivial. Aortic Valve: The aortic valve is tricuspid. Aortic valve regurgitation is not visualized. No aortic stenosis is present. Pulmonic Valve: The pulmonic valve was normal in structure. Pulmonic valve regurgitation is not visualized. Aorta: The aortic root is normal in size and structure. IAS/Shunts: No PFO/ASD by color doppler. [employer] Electronically signed by Wilfred Lacy Signature Date/Time: 01/19/2023/3:03:50 PM    Final    CT ABDOMEN PELVIS WO CONTRAST  Result Date: 01/16/2023 CLINICAL DATA:  Abdominal pain. EXAM: CT ABDOMEN AND PELVIS WITHOUT CONTRAST TECHNIQUE: Multidetector CT imaging of the abdomen and pelvis was performed following the standard protocol without IV contrast. RADIATION DOSE REDUCTION: This exam was performed according to the departmental dose-optimization program which includes automated exposure control, adjustment of the mA and/or kV according to patient size and/or use of iterative reconstruction technique. COMPARISON:  09/26/2022 FINDINGS: Lower chest: 5 mm right lower lobe pulmonary nodule on image 5/series 4 is stable comparing back to a study from 05/06/2021 consistent with benign etiology. No followup imaging is recommended. Hepatobiliary: No suspicious focal abnormality in the liver on this study without intravenous contrast. Tiny calcified  gallstones evident. No intrahepatic or extrahepatic biliary dilation. Pancreas: No focal mass lesion. No dilatation of the main duct. No intraparenchymal cyst. No peripancreatic edema. Spleen: No splenomegaly. No suspicious focal mass lesion. Adrenals/Urinary Tract: No adrenal nodule or mass. Bilateral renal cysts are stable. 12 mm exophytic lesion posterior upper pole right kidney (33/2) has attenuation too high to be a simple cyst. This finding measured 16 mm on 05/06/2021 and 16 mm on 06/19/2020 consistent with benign etiology with some involution and probably complicated now by proteinaceous debris or hemorrhage. Similar 19 mm posterior exophytic interpolar left renal lesion was 2.3 cm on 06/19/2020 and is stable since 09/26/2022. Kidneys were evaluated by MRI on 09/26/2022 documenting bilateral Bosniak I and II cysts in both kidneys without suspicious renal mass lesion evident. No evidence for hydroureter. The urinary bladder appears normal for the degree of distention. Stomach/Bowel: Stomach is unremarkable. No gastric wall thickening. No evidence of outlet obstruction. Duodenum is normally positioned as is the ligament of Treitz. No small bowel wall thickening. No small bowel dilatation. The terminal ileum is normal. The appendix is normal. Diverticuli are seen scattered along the entire length of the colon without CT findings of diverticulitis. Vascular/Lymphatic: There is mild atherosclerotic calcification of the abdominal aorta without aneurysm. There is no gastrohepatic or hepatoduodenal ligament lymphadenopathy. No retroperitoneal or mesenteric lymphadenopathy. No pelvic sidewall lymphadenopathy. Reproductive: The prostate gland and seminal vesicles are unremarkable. Other: No intraperitoneal free fluid. Musculoskeletal: No worrisome lytic or sclerotic osseous abnormality. IMPRESSION: 1. No acute findings in the abdomen or pelvis. Specifically, no findings to explain the patient's history of abdominal  pain. 2. Cholelithiasis. 3. Colonic diverticulosis without diverticulitis. 4. Bosniak I and II renal cysts as characterized by MRI 09/26/2022. 5.  Aortic Atherosclerosis (ICD10-I70.0). Electronically Signed   By: Kennith Center M.D.   On: 01/16/2023 15:10   DG Chest Portable 1 View  Result Date: 01/16/2023 CLINICAL DATA:  57 year old male with shortness of  breath. Abdominal pain. "Fluid buildup". EXAM: PORTABLE CHEST 1 VIEW COMPARISON:  Chest radiographs 05/18/2022 and earlier. FINDINGS: Portable AP view at elevn 51 hours. Cardiomegaly is less apparent. Other mediastinal contours are within normal limits. Visualized tracheal air column is within normal limits. Regressed pulmonary edema/vascular congestion. No edema now. Mildly improved lung volumes. Eventration of the diaphragm, normal variant. No pneumothorax, pleural effusion, confluent lung opacity. No acute osseous abnormality identified. No bowel gas in the visible abdomen. IMPRESSION: No acute cardiopulmonary abnormality. Resolved pulmonary edema seen in March. Electronically Signed   By: Odessa Fleming M.D.   On: 01/16/2023 11:57      Labs: CBC: Recent Labs  Lab 01/16/23 1133 01/17/23 0546  WBC 9.3 9.0  NEUTROABS 6.3 6.5  HGB 17.3* 16.2  HCT 53.2* 48.7  MCV 85.8 84.3  PLT 295 258   Basic Metabolic Panel: Recent Labs  Lab 01/16/23 1133 01/17/23 0546 01/18/23 0801 01/19/23 0409  NA 134* 135 131* 134*  K 3.9 3.7 4.0 4.0  CL 96* 96* 94* 99  CO2 24 25 25 25   GLUCOSE 259* 195* 179* 189*  BUN 45* 47* 52* 60*  CREATININE 4.04* 4.27* 4.52* 4.53*  CALCIUM 8.7* 8.7* 8.3* 7.7*   Liver Function Tests: Recent Labs  Lab 01/16/23 1133 01/19/23 0409  AST 25 20  ALT 18 15  ALKPHOS 114 86  BILITOT 0.9 0.8  PROT 7.8 6.5  ALBUMIN 3.8 3.3*    Discharge time spent: greater than 30 minutes.  Signed: Alford Highland, MD [facility] 01/19/2023

## 2023-01-19 NOTE — Anesthesia Preprocedure Evaluation (Signed)
Anesthesia Evaluation  Patient identified by MRN, date of birth, ID band Patient awake  General Assessment Comment:Patient in ICU for HF exacerbation. On milrinone and amiodarone gtt. Currently in afib, rate controlled.   Reviewed: Allergy & Precautions, NPO status , Patient's Chart, lab work & pertinent test results  History of Anesthesia Complications Negative for: history of anesthetic complications  Airway Mallampati: III  TM Distance: >3 FB Neck ROM: full    Dental  (+) Chipped, Poor Dentition, Missing, Edentulous Upper, Dental Advidsory Given   Pulmonary neg shortness of breath, sleep apnea , neg COPD, neg recent URI, former smoker   Pulmonary exam normal        Cardiovascular hypertension, (-) angina + Past MI and +CHF  (-) Cardiac Stents + dysrhythmias Atrial Fibrillation (-) Valvular Problems/Murmurs  IMPRESSIONS     1. Left ventricular ejection fraction, by estimation, is <20%. The left  ventricle has severely decreased function. The left ventricle has no  regional wall motion abnormalities. The left ventricular internal cavity  size was mildly to moderately dilated.   2. Right ventricular systolic function is normal. The right ventricular  size is normal. There is mildly elevated pulmonary artery systolic  pressure.   3. Left atrial size was severely dilated.   4. Right atrial size was mildly dilated.   5. The mitral valve is normal in structure. Moderate to severe mitral  valve regurgitation. No evidence of mitral stenosis.   6. The aortic valve is normal in structure. Aortic valve regurgitation is  not visualized. No aortic stenosis is present.   7. Moderately dilated pulmonary artery.   8. The inferior vena cava is dilated in size with <50% respiratory  variability, suggesting right atrial pressure of 15 mmHg.     Neuro/Psych negative neurological ROS  negative psych ROS   GI/Hepatic negative GI ROS, Neg  liver ROS,,,  Endo/Other  diabetesHypothyroidism  Class 3 obesity  Renal/GU Renal disease  negative genitourinary   Musculoskeletal   Abdominal   Peds  Hematology  (+) Blood dyscrasia, anemia   Anesthesia Other Findings Past Medical History: No date: Alcohol abuse 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 12/2007 MV: EF 28%, no isch;  b. 8/12 Echo: EF               25-35%; c. 02/2014 Echo: EF 20-25%; d. 12/2014 Cath:               minimal CAD; e. 01/2015 Echo: EF 50-55%;  d. 05/2016 Echo:              EF 30-35%, diff HK, gr2 DD; e. 11/2016 Echo: EF 45-50%,               diff HK; f. 09/2021 Echo: EF <20%; g. 11/2021 Echo:               EF<20%. No date: Chronic combined systolic (congestive) and diastolic  (congestive) heart failure (HCC)     Comment:  a. 05/2016 Echo: EF 30-35%; b. 11/2016 Echo: EF 45-50%,               diff HK; c. 09/2021 Echo: EF < 20%; d. 11/2021 Echo: EF               <20%, no rwma, Nl RV fxn, sev dil LA, mod-sev MR. mod dil              PA w/ mildly elev PASP. No date:  CKD (chronic kidney disease), stage III (HCC) No date: Elevated troponin (chronic) No date: Essential hypertension 11/2013: GI bleed No date: Hyperlipidemia No date: Pancreatitis No date: Paroxysmal A-fib (HCC)     Comment:  a. new onset s/p unsuccessful TEE/DCCV on 08/16/2014; b.               CHA2DS2VASc = 3-> eliquis (freq noncompliant); c. 02/2021               s/p TEE/DCCV; d. Prev on amio->d/c 2/2 abnl TFTs. No date: Paroxysmal atrial flutter (HCC) No date: Sleep-disordered breathing     Comment:  Has yet to have a sleep study No date: Stroke [facility])  Past Surgical History: 01/09/2015: CARDIAC CATHETERIZATION; N/A     Comment:  Procedure: Left Heart Cath and Coronary Angiography;                Surgeon: Marykay Lex, MD;  Location: [facility] INVASIVE CV              LAB;  Service: Cardiovascular;  Laterality: N/A; 03/05/2021: CARDIOVERSION; N/A     Comment:  Procedure:  CARDIOVERSION;  Surgeon: Debbe Odea,               MD;  Location: [facility] ORS;  Service: Cardiovascular;                Laterality: N/A; 04/28/2022: CENTRAL LINE INSERTION     Comment:  Procedure: CENTRAL LINE INSERTION;  Surgeon: Yvonne Kendall, MD;  Location: [facility] INVASIVE CV LAB;                Service: Cardiovascular;; 07/22/2020: COLONOSCOPY; N/A     Comment:  Procedure: COLONOSCOPY;  Surgeon: Toledo, Boykin Nearing, MD;              Location: [facility] ENDOSCOPY;  Service: Gastroenterology;                Laterality: N/A; 08/16/2014: ELECTROPHYSIOLOGIC STUDY; N/A     Comment:  Procedure: CARDIOVERSION;  Surgeon: Antonieta Iba,               MD;  Location: [facility] ORS;  Service: Cardiovascular;                Laterality: N/A; 10/10/2015: FLEXIBLE SIGMOIDOSCOPY; N/A     Comment:  Procedure: FLEXIBLE SIGMOIDOSCOPY;  Surgeon: Christena Deem, MD;  Location: [facility] ENDOSCOPY;  Service:               Endoscopy;  Laterality: N/A; No date: KNEE SURGERY; Right November 2011: [facility] MYOVIEW LTD     Comment:  No ischemia or infarction. EF 50-55% ( no improvement               from 2009 Myoview EF of 28% 04/28/2022: RIGHT HEART CATH; N/A     Comment:  Procedure: RIGHT HEART CATH;  Surgeon: Yvonne Kendall,              MD;  Location: [facility] INVASIVE CV LAB;  Service:               Cardiovascular;  Laterality: N/A; 08/16/2014: TEE WITHOUT CARDIOVERSION; N/A     Comment:  Procedure: TRANSESOPHAGEAL ECHOCARDIOGRAM (TEE);                Surgeon: Antonieta Iba, MD;  Location: [facility] ORS;                Service: Cardiovascular;  Laterality: N/A; 03/05/2021: TEE WITHOUT CARDIOVERSION; N/A     Comment:  Procedure: TRANSESOPHAGEAL ECHOCARDIOGRAM (TEE);                Surgeon: Debbe Odea, MD;  Location: [facility] ORS;                Service: Cardiovascular;  Laterality: N/A;  BMI    Body Mass Index: 32.13 kg/m      Reproductive/Obstetrics negative OB ROS                              Anesthesia Physical Anesthesia Plan  ASA: 4  Anesthesia Plan: General   Post-op Pain Management:    Induction: Intravenous  PONV Risk Score and Plan: 2 and Propofol infusion and TIVA  Airway Management Planned: Natural Airway and Nasal Cannula  Additional Equipment:   Intra-op Plan:   Post-operative Plan:   Informed Consent: I have reviewed the patients History and Physical, chart, labs and discussed the procedure including the risks, benefits and alternatives for the proposed anesthesia with the patient or authorized representative who has indicated his/her understanding and acceptance.     Dental Advisory Given  Plan Discussed with: Anesthesiologist, CRNA and Surgeon  Anesthesia Plan Comments: (Patient consented for risks of anesthesia including but not limited to:  - adverse reactions to medications - risk of airway placement if required - damage to eyes, teeth, lips or other oral mucosa - nerve damage due to positioning  - sore throat or hoarseness - Damage to heart, brain, nerves, lungs, other parts of body or loss of life  Patient voiced understanding.)        Anesthesia Quick Evaluation

## 2023-01-19 NOTE — Assessment & Plan Note (Signed)
Patient cardioverted today into normal sinus rhythm.  Continue increased dose of amiodarone 400 mg daily for 1 week and then down to 200 mg daily after that.

## 2023-01-19 NOTE — CV Procedure (Signed)
Procedure: TEE  Sedation: Per anesthesiology  Indication: Atypical atrial flutter  Findings: Please see echo section for full report.  There was not LA appendage thrombus, patient will proceed to DCCV.  Hector [name] 01/19/2023 8:10 AM

## 2023-01-19 NOTE — Progress Notes (Addendum)
[facility]  ROUNDING NOTE   Subjective:   Hector [name] is a 57 y.o. male with past medical conditions including hypertension, paroxysmal A-fib on Eliquis, CHF with a EF less than 20%, and chronic kidney disease stage IV. Patient presents to the emergency department with abdominal distension. He has been admitted for Generalized abdominal pain [R10.84] Inappropriate sinus tachycardia (HCC) [I47.11] AKI (acute kidney injury) (HCC) [N17.9] Abdominal pain [R10.9]  Patient is known to our practice from previous admissions. He currently follows with [facility] nephrology.   Patient seen sitting at side of bed States he feels well.  Reports abd is at baseline Feels better after lasix  Creatinine 4.53 Cardioversion today  Objective:  Vital signs in last 24 hours:  Temp:  [97.7 F (36.5 C)-98.8 F (37.1 C)] 97.7 F (36.5 C) (11/26 0736) Pulse Rate:  [52-100] 58 (11/26 0846) Resp:  [12-32] 17 (11/26 0846) BP: (74-114)/(59-91) 106/77 (11/26 0846) SpO2:  [92 %-97 %] 96 % (11/26 0846) Weight:  [120.7 kg] 120.7 kg (11/26 0419)  Weight change: -1.907 kg Filed Weights   01/16/23 1046 01/18/23 0500 01/19/23 0419  Weight: 111.1 kg 122.6 kg 120.7 kg    Intake/Output: I/O last 3 completed shifts: In: 20 [I.V.:20] Out: 1500 [Urine:1500]   Intake/Output this shift:  Total I/O In: -  Out: 150 [Urine:150]  Physical Exam: General: NAD  Head: Normocephalic, atraumatic. Moist oral mucosal membranes  Eyes: Anicteric  Lungs:  Clear to auscultation, room air  Heart: Regular rate and rhythm  Abdomen:  Soft, nontender, distention  Extremities:  no peripheral edema.  Neurologic: Alert, moving all four extremities  Skin: No lesions       Basic Metabolic Panel: Recent Labs  Lab 01/16/23 1133 01/17/23 0546 01/18/23 0801 01/19/23 0409  NA 134* 135 131* 134*  K 3.9 3.7 4.0 4.0  CL 96* 96* 94* 99  CO2 24 25 25 25   GLUCOSE 259* 195* 179* 189*  BUN 45* 47* 52* 60*   CREATININE 4.04* 4.27* 4.52* 4.53*  CALCIUM 8.7* 8.7* 8.3* 7.7*    Liver Function Tests: Recent Labs  Lab 01/16/23 1133 01/19/23 0409  AST 25 20  ALT 18 15  ALKPHOS 114 86  BILITOT 0.9 0.8  PROT 7.8 6.5  ALBUMIN 3.8 3.3*   Recent Labs  Lab 01/16/23 1203  LIPASE 29   No results for input(s): "AMMONIA" in the last 168 hours.  CBC: Recent Labs  Lab 01/16/23 1133 01/17/23 0546  WBC 9.3 9.0  NEUTROABS 6.3 6.5  HGB 17.3* 16.2  HCT 53.2* 48.7  MCV 85.8 84.3  PLT 295 258    Cardiac Enzymes: No results for input(s): "CKTOTAL", "CKMB", "CKMBINDEX", "TROPONINI" in the last 168 hours.  BNP: Invalid input(s): "POCBNP"  CBG: No results for input(s): "GLUCAP" in the last 168 hours.  Microbiology: Results for orders placed or performed during the hospital encounter of 09/20/22  Urine Culture     Status: Abnormal   Collection Time: 09/20/22  1:33 PM   Specimen: Urine, Clean Catch  Result Value Ref Range Status   Specimen Description   Final    URINE, CLEAN CATCH Performed at [facility], [address] [place], [place] [address]    Special Requests   Final    NONE Performed at [facility], [address] [place], [place] [address]    Culture MULTIPLE SPECIES PRESENT, SUGGEST RECOLLECTION (A)  Final   Report Status 09/21/2022 FINAL  Final  Chlamydia/NGC rt PCR [facility]  only)     Status: None   Collection Time: 09/20/22  1:33 PM   Specimen: Urine  Result Value Ref Range Status   Specimen source GC/Chlam URINE, RANDOM  Final   Chlamydia Tr NOT DETECTED NOT DETECTED Final   N gonorrhoeae NOT DETECTED NOT DETECTED Final    Comment: (NOTE) This CT/NG assay has not been evaluated in patients with a history of  hysterectomy. Performed at [facility], [address] [place], [place] [address]     Coagulation Studies: No results for input(s): "LABPROT", "INR" in the last 72 hours.  Urinalysis: Recent Labs    01/18/23 2325   COLORURINE YELLOW*  LABSPEC 1.009  PHURINE 6.0  GLUCOSEU >=500*  HGBUR SMALL*  BILIRUBINUR NEGATIVE  KETONESUR NEGATIVE  PROTEINUR 100*  NITRITE NEGATIVE  LEUKOCYTESUR TRACE*      Imaging: ECHO TEE  Result Date: 01/19/2023    TRANSESOPHOGEAL ECHO REPORT   Patient Name:   Hector [name] Date of Exam: 01/19/2023 Medical Rec #:  [id number]           Height:       71.0 in Accession #:    [id number]          Weight:       266.1 lb Date of Birth:  1965-10-08           BSA:          2.381 m Patient Age:    57 years            BP:           89/68 mmHg Patient Gender: M                   HR:           90 bpm. Exam Location:  [facility] Procedure: Transesophageal Echo, Color Doppler and Cardiac Doppler Indications:     Not listed on TEE check-in sheet  History:         Patient has prior history of Echocardiogram examinations, most                  recent 12/08/2021. Risk Factors:Hypertension. PAF.  Sonographer:     Cristela Blue Referring Phys:  [id number] ALMA L DIAZ Diagnosing Phys: Wilfred Lacy PROCEDURE: The transesophogeal probe was passed without difficulty through the esophogus of the patient. Sedation performed by different physician. The patient developed no complications during the procedure.  IMPRESSIONS  1. Left ventricular ejection fraction, by estimation, is <20%. The left ventricle has severely decreased function.  2. Right ventricular systolic function is severely reduced. The right ventricular size is normal.  3. Left atrial size was mildly dilated. No left atrial/left atrial appendage thrombus was detected.  4. The mitral valve is normal in structure. Mild mitral valve regurgitation. No evidence of mitral stenosis.  5. The aortic valve is tricuspid. Aortic valve regurgitation is not visualized. No aortic stenosis is present.  6. No PFO/ASD by color doppler. FINDINGS  Left Ventricle: Left ventricular ejection fraction, by estimation, is <20%. The left ventricle has severely decreased function.  The left ventricular internal cavity size was normal in size. There is no left ventricular hypertrophy. Right Ventricle: The right ventricular size is normal. No increase in right ventricular wall thickness. Right ventricular systolic function is severely reduced. Left Atrium: Left atrial size was mildly dilated. No left atrial/left atrial appendage thrombus was detected. Right Atrium: Right atrial size was normal in size. Pericardium: There is  no evidence of pericardial effusion. Mitral Valve: The mitral valve is normal in structure. Mild mitral valve regurgitation. No evidence of mitral valve stenosis. Tricuspid Valve: The tricuspid valve is normal in structure. Tricuspid valve regurgitation is trivial. Aortic Valve: The aortic valve is tricuspid. Aortic valve regurgitation is not visualized. No aortic stenosis is present. Pulmonic Valve: The pulmonic valve was normal in structure. Pulmonic valve regurgitation is not visualized. Aorta: The aortic root is normal in size and structure. IAS/Shunts: No PFO/ASD by color doppler. Hector [name] Electronically signed by Wilfred Lacy Signature Date/Time: 01/19/2023/3:03:50 PM    Final      Medications:     allopurinol  50 mg Oral Daily   amiodarone  400 mg Oral Daily   apixaban  5 mg Oral BID   atorvastatin  80 mg Oral Daily   empagliflozin  10 mg Oral Daily   ferrous sulfate  325 mg Oral Q breakfast   gabapentin  100 mg Oral BID   insulin aspart  0-5 Units Subcutaneous QHS   insulin aspart  0-6 Units Subcutaneous TID WC   isosorbide-hydrALAZINE  0.5 tablet Oral TID   levothyroxine  125 mcg Oral Q0600   lipase/protease/amylase  36,000 Units Oral TID AC   melatonin  5 mg Oral Once   sodium chloride flush  3 mL Intravenous Q12H   torsemide  40 mg Oral Daily   acetaminophen **OR** acetaminophen, morphine injection, ondansetron **OR** ondansetron (ZOFRAN) IV, oxyCODONE, polyethylene glycol  Assessment/ Plan:  Hector [name] is a 57 y.o.   male with past medical conditions including hypertension, paroxysmal A-fib on Eliquis, CHF with a EF less than 20%, and chronic kidney disease stage IV. Patient presents to the emergency department with abdominal distension. He has been admitted for Generalized abdominal pain [R10.84] Inappropriate sinus tachycardia (HCC) [I47.11] AKI (acute kidney injury) (HCC) [N17.9] Abdominal pain [R10.9]   Acute Kidney Injury on chronic kidney disease stage IV with baseline creatinine 3.35 and GFR of 20 on 12/24/22.  Acute kidney injury etiology unclear at this time.  CT abd/pelvis negative for hydronephrosis. Creatinine 4.04 on admission.   Creatinine remains elevated, but stable. No acute indication for dialysis. Patient will need to follow up with nephrologist at discharge.   Lab Results  Component Value Date   CREATININE 4.53 (H) 01/19/2023   CREATININE 4.52 (H) 01/18/2023   CREATININE 4.27 (H) 01/17/2023    Intake/Output Summary (Last 24 hours) at 01/19/2023 1546 Last data filed at 01/19/2023 0858 Gross per 24 hour  Intake 20 ml  Output 1250 ml  Net -1230 ml   2. Anemia of chronic kidney disease Lab Results  Component Value Date   HGB 16.2 01/17/2023    Hgb acceptable.   3. Chronic systolic and diastolic heart failure, ECHO from 12/08/2021 shows EF less than 20% with a moderate to severe MVR    LOS: 2 Emmagrace Runkel 11/26/20243:46 PM

## 2023-01-19 NOTE — Progress Notes (Signed)
*  PRELIMINARY RESULTS* Echocardiogram Echocardiogram Transesophageal has been performed.  Hector [name] 01/19/2023, 8:21 AM

## 2023-01-19 NOTE — Interval H&P Note (Signed)
History and Physical Interval Note:  01/19/2023 7:46 AM  Hector [name]  has presented today for surgery, with the diagnosis of atypical atrial flutter.  The various methods of treatment have been discussed with the patient and family. After consideration of risks, benefits and other options for treatment, the patient has consented to  Procedure(s): TRANSESOPHAGEAL ECHOCARDIOGRAM (TEE) (N/A) CARDIOVERSION (N/A) as a surgical intervention.  The patient's history has been reviewed, patient examined, no change in status, stable for surgery.  I have reviewed the patient's chart and labs.  Questions were answered to the patient's satisfaction.     Kasidee Voisin [employer]

## 2023-01-19 NOTE — Progress Notes (Signed)
Patient refused to sign informed consent for TEE. Patient states he is not signing consent until he is down stairs for the procedure.

## 2023-01-19 NOTE — Progress Notes (Signed)
Education Assessment and Provision:  Detailed education and instructions provided on heart failure disease management including the following:  Signs and symptoms of Heart Failure When to call the physician Importance of daily weights Low sodium diet Fluid restriction Medication management Anticipated future follow-up appointments-post hospital scheduled for 01/27/23 @ 2:30 pm with Inetta Fermo.  Pt is aware.  Patient education given on each of the above topics.  Patient acknowledges understanding via teach back method and acceptance of all instructions.  Education Materials:  "Living Better With Heart Failure" Booklet, HF zone tool, & Daily Weight Tracker Tool.  Patient has scale at home: Yes Patient has pill box at home: Yes    Roxy Horseman, RN, BSN [facility] Heart Failure Navigator Secure Chat Only

## 2023-01-20 ENCOUNTER — Encounter: Payer: Self-pay | Admitting: Cardiology

## 2023-01-25 NOTE — Anesthesia Postprocedure Evaluation (Signed)
Anesthesia Post Note  Patient: Hector [name]  Procedure(s) Performed: TRANSESOPHAGEAL ECHOCARDIOGRAM (TEE) CARDIOVERSION  Patient location during evaluation: Specials Recovery Anesthesia Type: General Level of consciousness: awake and alert Pain management: pain level controlled Vital Signs Assessment: post-procedure vital signs reviewed and stable Respiratory status: spontaneous breathing, nonlabored ventilation, respiratory function stable and patient connected to nasal cannula oxygen Cardiovascular status: blood pressure returned to baseline and stable Postop Assessment: no apparent nausea or vomiting Anesthetic complications: no   No notable events documented.   Last Vitals:  Vitals:   01/19/23 0838 01/19/23 0846  BP: 108/80 106/77  Pulse: (!) 56 (!) 58  Resp: 17 17  Temp:    SpO2: 95% 96%    Last Pain:  Vitals:   01/19/23 0838  TempSrc:   PainSc: 0-No pain                 Lenard Simmer

## 2023-01-27 ENCOUNTER — Encounter: Payer: Self-pay | Admitting: Family

## 2023-01-27 ENCOUNTER — Ambulatory Visit (HOSPITAL_BASED_OUTPATIENT_CLINIC_OR_DEPARTMENT_OTHER): Payer: Medicaid Other | Admitting: Family

## 2023-01-27 ENCOUNTER — Other Ambulatory Visit
Admission: RE | Admit: 2023-01-27 | Discharge: 2023-01-27 | Disposition: A | Discharge status: Home / Self Care | Payer: Medicaid Other | Source: Ambulatory Visit | Attending: Family | Admitting: Family

## 2023-01-27 ENCOUNTER — Encounter: Payer: Medicaid Other | Admitting: Family

## 2023-01-27 VITALS — BP 151/103 | HR 59 | Wt 282.0 lb

## 2023-01-27 DIAGNOSIS — I2510 Atherosclerotic heart disease of native coronary artery without angina pectoris: Secondary | ICD-10-CM

## 2023-01-27 DIAGNOSIS — Z91148 Patient's other noncompliance with medication regimen for other reason: Secondary | ICD-10-CM | POA: Insufficient documentation

## 2023-01-27 DIAGNOSIS — G4733 Obstructive sleep apnea (adult) (pediatric): Secondary | ICD-10-CM

## 2023-01-27 DIAGNOSIS — I5042 Chronic combined systolic (congestive) and diastolic (congestive) heart failure: Secondary | ICD-10-CM | POA: Insufficient documentation

## 2023-01-27 DIAGNOSIS — I5084 End stage heart failure: Secondary | ICD-10-CM | POA: Insufficient documentation

## 2023-01-27 DIAGNOSIS — E1122 Type 2 diabetes mellitus with diabetic chronic kidney disease: Secondary | ICD-10-CM | POA: Insufficient documentation

## 2023-01-27 DIAGNOSIS — Z79899 Other long term (current) drug therapy: Secondary | ICD-10-CM | POA: Insufficient documentation

## 2023-01-27 DIAGNOSIS — I130 Hypertensive heart and chronic kidney disease with heart failure and stage 1 through stage 4 chronic kidney disease, or unspecified chronic kidney disease: Secondary | ICD-10-CM | POA: Insufficient documentation

## 2023-01-27 DIAGNOSIS — I5022 Chronic systolic (congestive) heart failure: Secondary | ICD-10-CM | POA: Diagnosis present

## 2023-01-27 DIAGNOSIS — N184 Chronic kidney disease, stage 4 (severe): Secondary | ICD-10-CM

## 2023-01-27 DIAGNOSIS — I428 Other cardiomyopathies: Secondary | ICD-10-CM | POA: Insufficient documentation

## 2023-01-27 DIAGNOSIS — Z7901 Long term (current) use of anticoagulants: Secondary | ICD-10-CM | POA: Insufficient documentation

## 2023-01-27 DIAGNOSIS — I480 Paroxysmal atrial fibrillation: Secondary | ICD-10-CM

## 2023-01-27 LAB — BASIC METABOLIC PANEL
Anion gap: 10 (ref 5–15)
BUN: 46 mg/dL — ABNORMAL HIGH (ref 6–20)
CO2: 26 mmol/L (ref 22–32)
Calcium: 8.8 mg/dL — ABNORMAL LOW (ref 8.9–10.3)
Chloride: 101 mmol/L (ref 98–111)
Creatinine, Ser: 3.6 mg/dL — ABNORMAL HIGH (ref 0.61–1.24)
GFR, Estimated: 19 mL/min — ABNORMAL LOW (ref >60)
Glucose, Bld: 122 mg/dL — ABNORMAL HIGH (ref 70–99)
Potassium: 4.4 mmol/L (ref 3.5–5.1)
Sodium: 137 mmol/L (ref 135–145)

## 2023-01-27 NOTE — Patient Instructions (Signed)
Please take your medications before coming to your appointments so that your blood pressure can be accurately assessed.

## 2023-01-27 NOTE — Progress Notes (Signed)
ADVANCED HF CLINIC NOTE   Primary Care: Oswaldo Conroy, MD Primary Cardiologist: Julien Nordmann, MD (last seen 04/24) HP provider: Arvilla Meres, MD (last seen 04/24)   HPI:   Hector [name] is a 57 yo male with ETOH use, DM2, morbid obesity, PAF (h/o GIB on apixaban),  OSA on CPAP, CKD stage IV (Scr ~3.3), medication noncompliance.    HF dates back to at least 2009, thought to presumably be nonischemic in the setting of alcohol use, with an EF of 20%.     Echo 2016 EF 20-25%. LHC 12/2014 minimal CAD   EF initially improved with restoration of NSR but then dropped back down    He underwent TEE/DCCV in 02/2021 at that time showed an EF of less than 20% with severely reduced RV systolic function.  He was reevaluated by EP in 04/2021 and felt not to be a good candidate for ICD or ablation given medication noncompliance.   Admitted in 9/23 & 10/23 with ADHF. Limited echo 10/23 EF <  20% with moderate RV dysfunction and moderate to sever MR.  He was discharged from the hospital on 12/12/2021 with a weight of 247 pounds.     Admitted 11/23, 12/23, 1/24 and 3/24 with recurrent HF.   In 3/24 underwent TEE-guided DCCV as well as RHC. TEE EF < 20%, mod RV HK, mod MR. RHC with markedly elevated filling pressures and low CO  RA 17 PA 58/39 (43) PCWP 33 Fick 3.4/1.5 (PA sat 49%) PAPi 1.2   Seen in ER 05/18/22 with recurrent volume overload. Lasix switched to torsemide and metolazone added.  Admitted 01/16/23 due to abdominal pain and found to be tachycardic. CT scan of the abdomen without contrast did not find any findings in the abdomen or pelvis to explain the patient's abdominal pain, cholelithiasis, renal cysts. Worsening renal function so diuretic held and IVF given. Cardiology and nephrology consults done. Was in atrial flutter with successful cardioversion. Given amiodarone.   Echo 01/19/23: EF <20% with mild MR, no thrombus  He presents today for a HF f/u visit with a chief  complaint of minimal fatigue with moderate exertion. Chronic in nature. Has associated gradual weight gain due to "eating too much". Denies shortness of breath, chest pain, cough, palpitations, abdominal distention, pedal edema, dizziness or difficulty sleeping. Overall he says that he's feeling "much better"  Has CPAP but hasn't worn it in "forever". Has not taken any of his medications yet today because he didn't want to be in the bathroom all the time. (It's 2:30pm)   Past Medical History:  Diagnosis Date   Alcohol abuse    Alcoholic cardiomyopathy (HCC)    a. 12/2007 MV: EF 28%, no isch;  b. 8/12 Echo: EF 25-35%; c. 02/2014 Echo: EF 20-25%; d. 12/2014 Cath: minimal CAD; e. 01/2015 Echo: EF 50-55%;  d. 05/2016 Echo: EF 30-35%, diff HK, gr2 DD; e. 11/2016 Echo: EF 45-50%, diff HK; f. 09/2021 Echo: EF <20%; g. 11/2021 Echo: EF<20%.   Chronic combined systolic (congestive) and diastolic (congestive) heart failure (HCC)    a. 05/2016 Echo: EF 30-35%; b. 11/2016 Echo: EF 45-50%, diff HK; c. 09/2021 Echo: EF < 20%; d. 11/2021 Echo: EF <20%, no rwma, Nl RV fxn, sev dil LA, mod-sev MR. mod dil PA w/ mildly elev PASP.   CKD (chronic kidney disease), stage III (HCC)    Elevated troponin (chronic)    Essential hypertension    GI bleed 11/2013   Hyperlipidemia    Pancreatitis  Paroxysmal A-fib (HCC)    a. new onset s/p unsuccessful TEE/DCCV on 08/16/2014; b. CHA2DS2VASc = 3-> eliquis (freq noncompliant); c. 02/2021 s/p TEE/DCCV; d. Prev on amio->d/c 2/2 abnl TFTs.   Paroxysmal atrial flutter (HCC)    Sleep-disordered breathing    Has yet to have a sleep study   Stroke [facility])     Current Outpatient Medications  Medication Sig Dispense Refill   apixaban (ELIQUIS) 5 MG TABS tablet Take 1 tablet (5 mg total) by mouth 2 (two) times daily. 60 tablet 0   atorvastatin (LIPITOR) 80 MG tablet Take 1 tablet (80 mg total) by mouth daily. 90 tablet 11   empagliflozin (JARDIANCE) 10 MG TABS tablet Take 1 tablet (10  mg total) by mouth daily. 30 tablet 5   ferrous sulfate 325 (65 FE) MG EC tablet Take 325 mg by mouth daily with breakfast.     gabapentin (NEURONTIN) 100 MG capsule Take 1 capsule (100 mg total) by mouth 2 (two) times daily. 60 capsule 0   lipase/protease/amylase (CREON) 36000 UNITS CPEP capsule Take 1 capsule (36,000 Units total) by mouth 3 (three) times daily before meals. 180 capsule 0   polyethylene glycol (MIRALAX / GLYCOLAX) 17 g packet Take 17 g by mouth daily as needed for mild constipation. 30 each 0   torsemide (DEMADEX) 20 MG tablet Take 40 mg by mouth daily.     No current facility-administered medications for this visit.    Allergies  Allergen Reactions   Esomeprazole Magnesium Other (See Comments)    Suspected interstitial nephritis 2018   Egg-Derived Products Rash      Social History   Socioeconomic History   Marital status: Single    Spouse name: Not on file   Number of children: Not on file   Years of education: Not on file   Highest education level: Not on file  Occupational History   Not on file  Tobacco Use   Smoking status: Former    Current packs/day: 0.00    Average packs/day: 1 pack/day for 12.0 years (12.0 ttl pk-yrs)    Types: Cigarettes    Start date: 02/23/1986    Quit date: 02/23/1998    Years since quitting: 24.9   Smokeless tobacco: Never  Vaping Use   Vaping status: Never Used  Substance and Sexual Activity   Alcohol use: Not Currently    Alcohol/week: 0.0 standard drinks of alcohol    Comment: Past heavy drinker   Drug use: No   Sexual activity: Not Currently  Other Topics Concern   Not on file  Social History Narrative   Not on file   Social Determinants of Health   Financial Resource Strain: Low Risk  (01/19/2023)   Overall Financial Resource Strain (CARDIA)    Difficulty of Paying Living Expenses: Not hard at all  Food Insecurity: No Food Insecurity (01/17/2023)   Hunger Vital Sign    Worried About Running Out of Food in the  Last Year: Never true    Ran Out of Food in the Last Year: Never true  Transportation Needs: No Transportation Needs (01/19/2023)   PRAPARE - Administrator, Civil Service (Medical): No    Lack of Transportation (Non-Medical): No  Physical Activity: Inactive (01/04/2019)   Exercise Vital Sign    Days of Exercise per Week: 0 days    Minutes of Exercise per Session: 0 min  Stress: No Stress Concern Present (01/04/2019)   [employer] of Occupational Health - Occupational Stress  Questionnaire    Feeling of Stress : Not at all  Social Connections: Moderately Isolated (01/04/2019)   Social Connection and Isolation Panel [NHANES]    Frequency of Communication with Friends and Family: More than three times a week    Frequency of Social Gatherings with Friends and Family: More than three times a week    Attends Religious Services: 1 to 4 times per year    Active Member of [employer] or Organizations: No    Attends Banker Meetings: Never    Marital Status: Never married  Intimate Partner Violence: Not At Risk (01/17/2023)   Humiliation, Afraid, Rape, and Kick questionnaire    Fear of Current or Ex-Partner: No    Emotionally Abused: No    Physically Abused: No    Sexually Abused: No      Family History  Problem Relation Age of Onset   Hypertension Mother    Hyperlipidemia Mother    Diabetes Mother    Vitals:   01/27/23 1440  BP: (!) 151/103  Pulse: (!) 59  SpO2: 99%  Weight: 282 lb (127.9 kg)   Wt Readings from Last 3 Encounters:  01/27/23 282 lb (127.9 kg)  01/19/23 266 lb 1.5 oz (120.7 kg)  09/25/22 245 lb (111.1 kg)   Lab Results  Component Value Date   CREATININE 4.53 (H) 01/19/2023   CREATININE 4.52 (H) 01/18/2023   CREATININE 4.27 (H) 01/17/2023     PHYSICAL EXAM: General:  Sitting in chair  No resp difficulty HEENT: normal Neck: supple. no JVD. No lymphadenopathy or thryomegaly appreciated. Cor: PMI laterally displaced. Regular rhythm,  bradycardic. No rubs, gallops or murmurs. Lungs: clear Abdomen: soft, nontender, nondistended. No hepatosplenomegaly. No bruits or masses.  Extremities: no cyanosis, clubbing, rash, edema Neuro: alert & orientedx 3, cranial nerves grossly intact. moves all 4 extremities w/o difficulty. Affect pleasant  ECG: SB, HR 56 (personally reviewed)   ASSESSMENT & PLAN:  1. 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 Has been seen by EP and not felt to be candidate for ablation.  - NYHA class II - euvolemic - has scales, not weighing and implies that he won't start; encouraged him to weigh every morning and call for overnight weight gain of > 2 pounds or a weekly weight gain of > 5 pounds - weight up 26 pounds from last visit here 6 months ago; says that he's eating "too much and all the time" - LHC 2016 minimal CAD - Limited echocardiogram 10/23. LVEF<20% RV moderately down moderate to severe MR - Has had multiple hospitalizations for heart failure exacerbation likely secondary to noncompliance and end-stage CM  -Patient was previously deemed not to be ICD insertion by EP due to noncompliance - Echo 3/24 EF < 20% Moderate RV dysfunction - RHC 3/24 RA 17 PA 58/39 (43) PCWP 33 Fick 3.4/1.5 (PA sat 49%) PAPi 1.2  - Echo 01/19/23: EF <20% with mild MR, no thrombus - continue Jardiance 10mg  daily - BP 151/103 but he hasn't taken any of his meds yet today (it's 2:30pm); emphasized the importance of taking his meds prior to clinic visits so BP can accurately be assessed - continue Bidil 1/2 tablet TID - continue torsemide 40mg  daily - No ACE inhibitor/ARB/ARNI/MRA secondary to stage IV CKD - No b-blocker with low output on cath - Given CKD 4, tobacco use and obesity he is not candidate for  advanced HF therapies - He has end-stage HF with poor insight into his  illness. Not interested in Palliative Care referral - saw HF provider (Bensimhon) 04/24 - BMET/ Mg today - BNP 06/17/22 was 897.4  2. Paroxysmal atrial fibrillation/atrial flutter- - continue apixaban 5 mg twice daily for CHA2DS2-VASc score of at least 6 - continue amio 200mg  daily although he doesn't have his bottle with him; he will check on this after returning home & staff will f/u to make sure he has it - TEE/DC-CV 3/24  - Last DC-CV 11/24 - Previously seen by EP -> not candidate for ablation - EKG SB   CKD stage IV- - Baseline Scr 2.8-3.4 - BMP 01/19/23 showed sodium 134, potassium 4.0, creatinine 4.53 & GFR 14 - BMET today - Avoid nephrotoxic agents where able - Not HD candidate with severe HF - saw nephrology Eulah Pont) 11/24   4. Nonobstructive coronary artery disease- - Cath 2016 - saw cardiology (Dunn) 04/24 - continue atorvastatin 80mg  daily - LDL 01/09/22 was 88   5. Obstructive sleep apnea- - does not wear CPAP and says that he hasn't worn it in "forever" and not interested in trying to wear it at this time - last sleep study done 12/2015   6. Type 2 diabetes- -A1c 7.7% on 01/17/23 -Continue SGLT2i -Per PCP   Return in 6 weeks, sooner if needed.

## 2023-01-28 ENCOUNTER — Telehealth: Payer: Self-pay

## 2023-01-28 ENCOUNTER — Other Ambulatory Visit: Payer: Self-pay

## 2023-01-28 MED ORDER — AMIODARONE HCL 200 MG PO TABS: 200.0000 mg | ORAL_TABLET | Freq: Every day | ORAL | 3 refills | Status: DC

## 2023-01-29 NOTE — Telephone Encounter (Signed)
Attempted to call pt to verify if pt is taking Amiodarone 200 mg QD. Lvm to return call.

## 2023-03-11 ENCOUNTER — Encounter: Payer: Medicaid Other | Admitting: Internal Medicine

## 2023-05-07 ENCOUNTER — Other Ambulatory Visit: Payer: Self-pay | Admitting: Internal Medicine

## 2023-06-21 ENCOUNTER — Encounter: Payer: Self-pay | Admitting: Internal Medicine

## 2023-06-21 ENCOUNTER — Emergency Department

## 2023-06-21 ENCOUNTER — Inpatient Hospital Stay
Admission: EM | Admit: 2023-06-21 | Discharge: 2023-07-01 | DRG: 280 | Disposition: A | Discharge status: Home / Self Care | Attending: Internal Medicine | Admitting: Internal Medicine

## 2023-06-21 ENCOUNTER — Observation Stay

## 2023-06-21 ENCOUNTER — Other Ambulatory Visit: Payer: Self-pay

## 2023-06-21 DIAGNOSIS — I5023 Acute on chronic systolic (congestive) heart failure: Secondary | ICD-10-CM | POA: Diagnosis not present

## 2023-06-21 DIAGNOSIS — E66812 Obesity, class 2: Secondary | ICD-10-CM | POA: Diagnosis present

## 2023-06-21 DIAGNOSIS — Z6837 Body mass index (BMI) 37.0-37.9, adult: Secondary | ICD-10-CM

## 2023-06-21 DIAGNOSIS — R112 Nausea with vomiting, unspecified: Secondary | ICD-10-CM | POA: Diagnosis not present

## 2023-06-21 DIAGNOSIS — Z8673 Personal history of transient ischemic attack (TIA), and cerebral infarction without residual deficits: Secondary | ICD-10-CM

## 2023-06-21 DIAGNOSIS — E1169 Type 2 diabetes mellitus with other specified complication: Secondary | ICD-10-CM | POA: Diagnosis not present

## 2023-06-21 DIAGNOSIS — Z83438 Family history of other disorder of lipoprotein metabolism and other lipidemia: Secondary | ICD-10-CM

## 2023-06-21 DIAGNOSIS — I82432 Acute embolism and thrombosis of left popliteal vein: Secondary | ICD-10-CM | POA: Diagnosis present

## 2023-06-21 DIAGNOSIS — F1021 Alcohol dependence, in remission: Secondary | ICD-10-CM | POA: Diagnosis present

## 2023-06-21 DIAGNOSIS — I160 Hypertensive urgency: Secondary | ICD-10-CM

## 2023-06-21 DIAGNOSIS — Z87891 Personal history of nicotine dependence: Secondary | ICD-10-CM

## 2023-06-21 DIAGNOSIS — Z91148 Patient's other noncompliance with medication regimen for other reason: Secondary | ICD-10-CM

## 2023-06-21 DIAGNOSIS — R600 Localized edema: Secondary | ICD-10-CM

## 2023-06-21 DIAGNOSIS — K8020 Calculus of gallbladder without cholecystitis without obstruction: Secondary | ICD-10-CM

## 2023-06-21 DIAGNOSIS — I82412 Acute embolism and thrombosis of left femoral vein: Secondary | ICD-10-CM | POA: Diagnosis present

## 2023-06-21 DIAGNOSIS — I10 Essential (primary) hypertension: Secondary | ICD-10-CM | POA: Diagnosis present

## 2023-06-21 DIAGNOSIS — N179 Acute kidney failure, unspecified: Secondary | ICD-10-CM | POA: Diagnosis present

## 2023-06-21 DIAGNOSIS — E1122 Type 2 diabetes mellitus with diabetic chronic kidney disease: Secondary | ICD-10-CM | POA: Diagnosis present

## 2023-06-21 DIAGNOSIS — I509 Heart failure, unspecified: Secondary | ICD-10-CM

## 2023-06-21 DIAGNOSIS — I4892 Unspecified atrial flutter: Secondary | ICD-10-CM | POA: Diagnosis present

## 2023-06-21 DIAGNOSIS — K761 Chronic passive congestion of liver: Secondary | ICD-10-CM | POA: Diagnosis present

## 2023-06-21 DIAGNOSIS — Z888 Allergy status to other drugs, medicaments and biological substances status: Secondary | ICD-10-CM

## 2023-06-21 DIAGNOSIS — J9601 Acute respiratory failure with hypoxia: Secondary | ICD-10-CM | POA: Diagnosis not present

## 2023-06-21 DIAGNOSIS — I480 Paroxysmal atrial fibrillation: Secondary | ICD-10-CM | POA: Diagnosis present

## 2023-06-21 DIAGNOSIS — Z79899 Other long term (current) drug therapy: Secondary | ICD-10-CM

## 2023-06-21 DIAGNOSIS — E1129 Type 2 diabetes mellitus with other diabetic kidney complication: Secondary | ICD-10-CM | POA: Diagnosis present

## 2023-06-21 DIAGNOSIS — R570 Cardiogenic shock: Secondary | ICD-10-CM | POA: Diagnosis present

## 2023-06-21 DIAGNOSIS — R1084 Generalized abdominal pain: Principal | ICD-10-CM

## 2023-06-21 DIAGNOSIS — Z91012 Allergy to eggs: Secondary | ICD-10-CM

## 2023-06-21 DIAGNOSIS — E785 Hyperlipidemia, unspecified: Secondary | ICD-10-CM | POA: Diagnosis present

## 2023-06-21 DIAGNOSIS — Z7984 Long term (current) use of oral hypoglycemic drugs: Secondary | ICD-10-CM

## 2023-06-21 DIAGNOSIS — N184 Chronic kidney disease, stage 4 (severe): Secondary | ICD-10-CM | POA: Diagnosis present

## 2023-06-21 DIAGNOSIS — I214 Non-ST elevation (NSTEMI) myocardial infarction: Secondary | ICD-10-CM | POA: Insufficient documentation

## 2023-06-21 DIAGNOSIS — I2510 Atherosclerotic heart disease of native coronary artery without angina pectoris: Secondary | ICD-10-CM | POA: Diagnosis present

## 2023-06-21 DIAGNOSIS — E871 Hypo-osmolality and hyponatremia: Secondary | ICD-10-CM | POA: Diagnosis present

## 2023-06-21 DIAGNOSIS — I130 Hypertensive heart and chronic kidney disease with heart failure and stage 1 through stage 4 chronic kidney disease, or unspecified chronic kidney disease: Principal | ICD-10-CM | POA: Diagnosis present

## 2023-06-21 DIAGNOSIS — Z8249 Family history of ischemic heart disease and other diseases of the circulatory system: Secondary | ICD-10-CM

## 2023-06-21 DIAGNOSIS — G4733 Obstructive sleep apnea (adult) (pediatric): Secondary | ICD-10-CM | POA: Diagnosis present

## 2023-06-21 DIAGNOSIS — F1091 Alcohol use, unspecified, in remission: Secondary | ICD-10-CM | POA: Diagnosis present

## 2023-06-21 DIAGNOSIS — I428 Other cardiomyopathies: Secondary | ICD-10-CM | POA: Diagnosis present

## 2023-06-21 DIAGNOSIS — Z833 Family history of diabetes mellitus: Secondary | ICD-10-CM

## 2023-06-21 DIAGNOSIS — I21A1 Myocardial infarction type 2: Secondary | ICD-10-CM | POA: Diagnosis present

## 2023-06-21 DIAGNOSIS — N390 Urinary tract infection, site not specified: Secondary | ICD-10-CM

## 2023-06-21 DIAGNOSIS — I2721 Secondary pulmonary arterial hypertension: Secondary | ICD-10-CM | POA: Diagnosis present

## 2023-06-21 DIAGNOSIS — Z79890 Hormone replacement therapy (postmenopausal): Secondary | ICD-10-CM

## 2023-06-21 DIAGNOSIS — N2889 Other specified disorders of kidney and ureter: Secondary | ICD-10-CM | POA: Diagnosis present

## 2023-06-21 DIAGNOSIS — D631 Anemia in chronic kidney disease: Secondary | ICD-10-CM | POA: Diagnosis present

## 2023-06-21 DIAGNOSIS — I5043 Acute on chronic combined systolic (congestive) and diastolic (congestive) heart failure: Secondary | ICD-10-CM | POA: Diagnosis present

## 2023-06-21 DIAGNOSIS — I5084 End stage heart failure: Secondary | ICD-10-CM | POA: Diagnosis present

## 2023-06-21 DIAGNOSIS — Z7901 Long term (current) use of anticoagulants: Secondary | ICD-10-CM

## 2023-06-21 DIAGNOSIS — Z86718 Personal history of other venous thrombosis and embolism: Secondary | ICD-10-CM

## 2023-06-21 DIAGNOSIS — M109 Gout, unspecified: Secondary | ICD-10-CM | POA: Diagnosis present

## 2023-06-21 DIAGNOSIS — K7030 Alcoholic cirrhosis of liver without ascites: Secondary | ICD-10-CM | POA: Diagnosis present

## 2023-06-21 DIAGNOSIS — E876 Hypokalemia: Secondary | ICD-10-CM | POA: Diagnosis present

## 2023-06-21 LAB — URINALYSIS, ROUTINE W REFLEX MICROSCOPIC
Bilirubin Urine: NEGATIVE
Glucose, UA: NEGATIVE mg/dL
Ketones, ur: NEGATIVE mg/dL
Nitrite: NEGATIVE
Protein, ur: >=300 mg/dL — AB
Specific Gravity, Urine: 1.01 (ref 1.005–1.030)
pH: 6 (ref 5.0–8.0)

## 2023-06-21 LAB — CBC WITH DIFFERENTIAL/PLATELET
Abs Immature Granulocytes: 0.03 10*3/uL (ref 0.00–0.07)
Basophils Absolute: 0.1 10*3/uL (ref 0.0–0.1)
Basophils Relative: 1 %
Eosinophils Absolute: 0.2 10*3/uL (ref 0.0–0.5)
Eosinophils Relative: 3 %
HCT: 41.5 % (ref 39.0–52.0)
Hemoglobin: 13.2 g/dL (ref 13.0–17.0)
Immature Granulocytes: 0 %
Lymphocytes Relative: 16 %
Lymphs Abs: 1.3 10*3/uL (ref 0.7–4.0)
MCH: 27.4 pg (ref 26.0–34.0)
MCHC: 31.8 g/dL (ref 30.0–36.0)
MCV: 86.1 fL (ref 80.0–100.0)
Monocytes Absolute: 0.6 10*3/uL (ref 0.1–1.0)
Monocytes Relative: 8 %
Neutro Abs: 5.6 10*3/uL (ref 1.7–7.7)
Neutrophils Relative %: 72 %
Platelets: 238 10*3/uL (ref 150–400)
RBC: 4.82 MIL/uL (ref 4.22–5.81)
RDW: 17 % — ABNORMAL HIGH (ref 11.5–15.5)
WBC: 7.8 10*3/uL (ref 4.0–10.5)
nRBC: 0 % (ref 0.0–0.2)

## 2023-06-21 LAB — COMPREHENSIVE METABOLIC PANEL WITH GFR
ALT: 14 U/L (ref 0–44)
AST: 19 U/L (ref 15–41)
Albumin: 3.5 g/dL (ref 3.5–5.0)
Alkaline Phosphatase: 96 U/L (ref 38–126)
Anion gap: 13 (ref 5–15)
BUN: 33 mg/dL — ABNORMAL HIGH (ref 6–20)
CO2: 25 mmol/L (ref 22–32)
Calcium: 8.8 mg/dL — ABNORMAL LOW (ref 8.9–10.3)
Chloride: 103 mmol/L (ref 98–111)
Creatinine, Ser: 3.42 mg/dL — ABNORMAL HIGH (ref 0.61–1.24)
GFR, Estimated: 20 mL/min — ABNORMAL LOW (ref >60)
Glucose, Bld: 77 mg/dL (ref 70–99)
Potassium: 3.7 mmol/L (ref 3.5–5.1)
Sodium: 141 mmol/L (ref 135–145)
Total Bilirubin: 1.9 mg/dL — ABNORMAL HIGH (ref 0.0–1.2)
Total Protein: 6.9 g/dL (ref 6.5–8.1)

## 2023-06-21 LAB — LIPASE, BLOOD: Lipase: 27 U/L (ref 11–51)

## 2023-06-21 LAB — BRAIN NATRIURETIC PEPTIDE: B Natriuretic Peptide: 756.8 pg/mL — ABNORMAL HIGH (ref 0.0–100.0)

## 2023-06-21 LAB — TROPONIN I (HIGH SENSITIVITY)
Troponin I (High Sensitivity): 99 ng/L — ABNORMAL HIGH (ref <18)
Troponin I (High Sensitivity): 98 ng/L — ABNORMAL HIGH (ref <18)

## 2023-06-21 MED ORDER — HYDRALAZINE HCL 20 MG/ML IJ SOLN
10.0000 mg | Freq: Once | INTRAMUSCULAR | Status: AC
  Administered 2023-06-21: 10 mg via INTRAVENOUS
  Filled 2023-06-21: qty 1

## 2023-06-21 MED ORDER — BUMETANIDE 0.25 MG/ML IJ SOLN
1.0000 mg | Freq: Once | INTRAMUSCULAR | Status: AC
  Administered 2023-06-21: 1 mg via INTRAVENOUS
  Filled 2023-06-21: qty 4

## 2023-06-21 MED ORDER — SODIUM CHLORIDE 0.9% FLUSH
3.0000 mL | Freq: Two times a day (BID) | INTRAVENOUS | Status: DC
  Administered 2023-06-21 – 2023-07-01 (×16): 3 mL via INTRAVENOUS

## 2023-06-21 MED ORDER — ONDANSETRON HCL 4 MG PO TABS: 4.0000 mg | ORAL_TABLET | Freq: Four times a day (QID) | ORAL | Status: DC | PRN

## 2023-06-21 MED ORDER — MORPHINE SULFATE (PF) 4 MG/ML IV SOLN
4.0000 mg | Freq: Once | INTRAVENOUS | Status: AC
  Administered 2023-06-21: 4 mg via INTRAVENOUS
  Filled 2023-06-21: qty 1

## 2023-06-21 MED ORDER — INFLUENZA VIRUS VACC SPLIT PF (FLUZONE) 0.5 ML IM SUSY
0.5000 mL | PREFILLED_SYRINGE | INTRAMUSCULAR | Status: DC
  Filled 2023-06-21: qty 0.5

## 2023-06-21 MED ORDER — SODIUM CHLORIDE 0.9 % IV SOLN: 250.0000 mL | INTRAVENOUS | Status: AC | PRN

## 2023-06-21 MED ORDER — PANCRELIPASE (LIP-PROT-AMYL) 36000-114000 UNITS PO CPEP
36000.0000 [IU] | ORAL_CAPSULE | Freq: Three times a day (TID) | ORAL | Status: DC
  Administered 2023-06-21 – 2023-06-27 (×15): 36000 [IU] via ORAL
  Filled 2023-06-21 (×3): qty 1
  Filled 2023-06-21: qty 3
  Filled 2023-06-21 (×6): qty 1
  Filled 2023-06-21: qty 3
  Filled 2023-06-21 (×3): qty 1
  Filled 2023-06-21 (×2): qty 3
  Filled 2023-06-21 (×3): qty 1
  Filled 2023-06-21: qty 3

## 2023-06-21 MED ORDER — ONDANSETRON HCL 4 MG/2ML IJ SOLN
4.0000 mg | Freq: Once | INTRAMUSCULAR | Status: AC
  Administered 2023-06-21: 4 mg via INTRAVENOUS
  Filled 2023-06-21: qty 2

## 2023-06-21 MED ORDER — AMIODARONE HCL 200 MG PO TABS
200.0000 mg | ORAL_TABLET | Freq: Every day | ORAL | Status: DC
  Administered 2023-06-21 – 2023-06-26 (×5): 200 mg via ORAL
  Filled 2023-06-21 (×5): qty 1

## 2023-06-21 MED ORDER — SODIUM CHLORIDE 0.9 % IV SOLN
1.0000 g | Freq: Once | INTRAVENOUS | Status: AC
  Administered 2023-06-21: 1 g via INTRAVENOUS
  Filled 2023-06-21: qty 10

## 2023-06-21 MED ORDER — FUROSEMIDE 10 MG/ML IJ SOLN
60.0000 mg | Freq: Two times a day (BID) | INTRAMUSCULAR | Status: DC
  Administered 2023-06-21 – 2023-06-22 (×2): 60 mg via INTRAVENOUS
  Filled 2023-06-21 (×2): qty 6

## 2023-06-21 MED ORDER — APIXABAN 5 MG PO TABS
5.0000 mg | ORAL_TABLET | Freq: Two times a day (BID) | ORAL | Status: AC
  Administered 2023-06-21 – 2023-06-22 (×3): 5 mg via ORAL
  Filled 2023-06-21 (×3): qty 1

## 2023-06-21 MED ORDER — SODIUM CHLORIDE 0.9% FLUSH
3.0000 mL | INTRAVENOUS | Status: DC | PRN
  Administered 2023-06-22: 3 mL via INTRAVENOUS

## 2023-06-21 MED ORDER — ONDANSETRON HCL 4 MG/2ML IJ SOLN: 4.0000 mg | Freq: Four times a day (QID) | INTRAMUSCULAR | Status: DC | PRN

## 2023-06-21 MED ORDER — MORPHINE SULFATE (PF) 4 MG/ML IV SOLN
4.0000 mg | INTRAVENOUS | Status: DC | PRN
  Administered 2023-06-21 – 2023-07-01 (×27): 4 mg via INTRAVENOUS
  Filled 2023-06-21 (×28): qty 1

## 2023-06-21 MED ORDER — ENOXAPARIN SODIUM 60 MG/0.6ML IJ SOSY: 0.5000 mg/kg | PREFILLED_SYRINGE | INTRAMUSCULAR | Status: DC

## 2023-06-21 MED ORDER — TORSEMIDE 20 MG PO TABS
40.0000 mg | ORAL_TABLET | Freq: Every day | ORAL | Status: DC
  Administered 2023-06-21: 40 mg via ORAL
  Filled 2023-06-21: qty 2

## 2023-06-21 MED ORDER — LEVOTHYROXINE SODIUM 25 MCG PO TABS
125.0000 ug | ORAL_TABLET | Freq: Every day | ORAL | Status: DC
  Administered 2023-06-22 – 2023-07-01 (×9): 125 ug via ORAL
  Filled 2023-06-21 (×10): qty 1

## 2023-06-21 NOTE — Evaluation (Signed)
 Occupational Therapy Evaluation Patient Details Name: Hector [name] MRN: [id number] DOB: November 15, 1965 Today's Date: 06/21/2023   History of Present Illness   Pt is a 58 y.o. male presenting with abdominal pain, acute on chronic HFrEF. PMH of heart failure with reduced ejection fraction with EF less than 20%, nonischemic cardiomyopathy, history of alcohol use, paroxysmal atrial fibrillation/flutter on Eliquis , struct of sleep apnea on CPAP, chronic kidney disease stage IV, CVA, hypertension, hyperlipidemia, type 2 diabetes mellitus     Clinical Impressions Pt was seen for OT evaluation this date. PTA, pt resides with mom and brother in a 1 level home with ramp to enter. He reports IND with ADLs and does not perform IADLs. Brother drives him to MD appts and he has not been to the grocery store in over a year. Ambulates household distances with no AD. Pt reports no functional decline or weakness with no needs for therapy while in the hospital. He demo bed mobility, STS and ambulation to the bathroom and back without AD with IND and no LOB and did not appear or report SOB. Able to walk and transfer to transport chair to be taken down for imaging with independence. OT to sign off in house with no acute OT needs at this time and no follow up therapy recommended.    If plan is discharge home, recommend the following:         Functional Status Assessment   Patient has not had a recent decline in their functional status     Equipment Recommendations   None recommended by OT     Recommendations for Other Services         Precautions/Restrictions         Mobility Bed Mobility Overal bed mobility: Independent                  Transfers Overall transfer level: Independent                        Balance Overall balance assessment: Modified Independent                                         ADL either performed or assessed with clinical  judgement   ADL Overall ADL's : Independent                                             Vision         Perception         Praxis         Pertinent Vitals/Pain Pain Assessment Pain Assessment: No/denies pain     Extremity/Trunk Assessment Upper Extremity Assessment Upper Extremity Assessment: Overall WFL for tasks assessed   Lower Extremity Assessment Lower Extremity Assessment: Overall WFL for tasks assessed       Communication Communication Communication: No apparent difficulties   Cognition Arousal: Alert Behavior During Therapy: WFL for tasks assessed/performed Cognition: No apparent impairments                               Following commands: Intact       Cueing  General Comments      no SOB during mobility in the  room; pt reports "I don't need any therapy"   Exercises Other Exercises Other Exercises: Edu on role of OT in acute setting.   Shoulder Instructions      Home Living Family/patient expects to be discharged to:: Private residence Living Arrangements: Parent;Other relatives (mom and brother)   Type of Home: House Home Access: Stairs to enter;Ramped entrance     Home Layout: One level     Bathroom Shower/Tub: Tub/shower unit                    Prior Functioning/Environment Prior Level of Function : Independent/Modified Independent             Mobility Comments: IND without AD household distances ADLs Comments: reports IND with ADL management; brother drives him to MD appts, he does not go to grocery store    OT Problem List:     OT Treatment/Interventions:        OT Goals(Current goals can be found in the care plan section)       OT Frequency:       Co-evaluation              AM-PAC OT "6 Clicks" Daily Activity     Outcome Measure Help from another person eating meals?: None Help from another person taking care of personal grooming?: None Help from another person  toileting, which includes using toliet, bedpan, or urinal?: None Help from another person bathing (including washing, rinsing, drying)?: None Help from another person to put on and taking off regular upper body clothing?: None Help from another person to put on and taking off regular lower body clothing?: None 6 Click Score: 24   End of Session    Activity Tolerance: Patient tolerated treatment well Patient left: in chair (with transport technician)  OT Visit Diagnosis: Other abnormalities of gait and mobility (R26.89)                Time: 6213-0865 OT Time Calculation (min): 11 min Charges:  OT General Charges $OT Visit: 1 Visit OT Evaluation $OT Eval Low Complexity: 1 Low Barbaraann Avans, OTR/L 06/21/23, 1:36 PM  Rayla Pember E Isauro Skelley 06/21/2023, 1:34 PM

## 2023-06-21 NOTE — Assessment & Plan Note (Signed)
 Troponin 90s in the setting of overlapping acute on chronic HFrEF Minimal chest pain at present EKG stable Suspect minimal to mild demand ischemia in the setting of acute on chronic HFrEF Stage IV CKD and poor renal clearance also confounding issue Trend troponin as appropriate Continue home regiment Monitor

## 2023-06-21 NOTE — Assessment & Plan Note (Signed)
 Rate controlled at present Continue home regimen including amiodarone  and Eliquis  Follow-up cardiology recommendations

## 2023-06-21 NOTE — Assessment & Plan Note (Signed)
 Patient reports last alcohol use was 13 years ago

## 2023-06-21 NOTE — Assessment & Plan Note (Addendum)
 Abdominal pain Intractable nausea and vomiting on presentation CT abdomen pelvis grossly stable apart from cholelithiasis Noted T. bili 1.9 on presentation Positive right upper quadrant tenderness on palpation today Will check right upper quadrant ultrasound to better assess Consider general surgery evaluation No active alcohol use Defer IV PPI with concern for drug class associated nephritis and stage 4 chronic kidney disease Antiemetics IV fluid hydration Monitor

## 2023-06-21 NOTE — Assessment & Plan Note (Signed)
 CPAP.

## 2023-06-21 NOTE — Assessment & Plan Note (Deleted)
 Stable liver imaging on CT today LFTs within normal Monitor

## 2023-06-21 NOTE — H&P (Addendum)
 History and Physical    Patient: Hector [name] LOV:[id number] DOB: 03/12/1965 DOA: 06/21/2023 DOS: the patient was seen and examined on 06/21/2023 PCP: Dionicia Frater, MD  Patient coming from: Home  Chief Complaint: No chief complaint on file.  HPI: [name] is a 58 y.o. male with medical history significant of heart failure with reduced ejection fraction with EF less than 20%, nonischemic cardiomyopathy, history of alcohol use, paroxysmal atrial fibrillation/flutter on Eliquis , OSA on CPAP, chronic kidney disease stage IV, CVA, hypertension, hyperlipidemia, type 2 diabetes mellitus presenting with abdominal pain, acute on chronic HFrEF.  Patient reports progressive shortness of breath over the past 4 to 5 days.  Also with mild generalized abdominal pain.  Noted prior history of longstanding alcohol use.  Patient states he quit roughly 13 years ago.  Noted chronic nonischemic cardiomyopathy.  Reports compliance with diuretic as well as home BP regimen.  Denies any NSAID or high salt intake.  Worsening lower extremity swelling.  Has not been tracking weight at home.  Denies any tobacco use.  No diarrhea.  Abdominal pain generalized though with predominant right upper quadrant predominance.  P.o. intake has been fairly stable.  No focal hemiparesis or confusion.  Denies any illicit drug use. Presented to the ER afebrile, hemodynamically stable.  Satting well on room air.  White count 7.8, hemoglobin 13.2, platelets 238, creatinine 3.42.  Troponin 90s.  BNP 757. Review of Systems: As mentioned in the history of present illness. All other systems reviewed and are negative. Past Medical History:  Diagnosis Date   Alcohol abuse    Alcoholic cardiomyopathy (HCC)    a. 12/2007 MV: EF 28%, no isch;  b. 8/12 Echo: EF 25-35%; c. 02/2014 Echo: EF 20-25%; d. 12/2014 Cath: minimal CAD; e. 01/2015 Echo: EF 50-55%;  d. 05/2016 Echo: EF 30-35%, diff HK, gr2 DD; e. 11/2016 Echo: EF 45-50%, diff  HK; f. 09/2021 Echo: EF <20%; g. 11/2021 Echo: EF<20%.   Chronic combined systolic (congestive) and diastolic (congestive) heart failure (HCC)    a. 05/2016 Echo: EF 30-35%; b. 11/2016 Echo: EF 45-50%, diff HK; c. 09/2021 Echo: EF < 20%; d. 11/2021 Echo: EF <20%, no rwma, Nl RV fxn, sev dil LA, mod-sev MR. mod dil PA w/ mildly elev PASP.   CKD (chronic kidney disease), stage III (HCC)    Elevated troponin (chronic)    Essential hypertension    GI bleed 11/2013   Hyperlipidemia    Pancreatitis    Paroxysmal A-fib (HCC)    a. new onset s/p unsuccessful TEE/DCCV on 08/16/2014; b. CHA2DS2VASc = 3-> eliquis  (freq noncompliant); c. 02/2021 s/p TEE/DCCV; d. Prev on amio->d/c 2/2 abnl TFTs.   Paroxysmal atrial flutter (HCC)    Sleep-disordered breathing    Has yet to have a sleep study   Stroke [facility])    Past Surgical History:  Procedure Laterality Date   CARDIAC CATHETERIZATION N/A 01/09/2015   Procedure: Left Heart Cath and Coronary Angiography;  Surgeon: Arleen Lacer, MD;  Location: [facility] INVASIVE CV LAB;  Service: Cardiovascular;  Laterality: N/A;   CARDIOVERSION N/A 03/05/2021   Procedure: CARDIOVERSION;  Surgeon: Constancia Delton, MD;  Location: [facility] ORS;  Service: Cardiovascular;  Laterality: N/A;   CARDIOVERSION N/A 01/19/2023   Procedure: CARDIOVERSION;  Surgeon: Darlis Eisenmenger, MD;  Location: [facility] ORS;  Service: Cardiovascular;  Laterality: N/A;   CENTRAL LINE INSERTION  04/28/2022   Procedure: CENTRAL LINE INSERTION;  Surgeon: Sammy Crisp, MD;  Location: [facility] INVASIVE CV LAB;  Service: Cardiovascular;;   COLONOSCOPY N/A 07/22/2020   Procedure: COLONOSCOPY;  Surgeon: Toledo, Alphonsus Jeans, MD;  Location: [facility] ENDOSCOPY;  Service: Gastroenterology;  Laterality: N/A;   ELECTROPHYSIOLOGIC STUDY N/A 08/16/2014   Procedure: CARDIOVERSION;  Surgeon: Devorah Fonder, MD;  Location: [facility] ORS;  Service: Cardiovascular;  Laterality: N/A;   ESOPHAGOGASTRODUODENOSCOPY (EGD) WITH PROPOFOL  N/A  05/04/2022   Procedure: ESOPHAGOGASTRODUODENOSCOPY (EGD) WITH PROPOFOL ;  Surgeon: Selena Daily, MD;  Location: [facility] ENDOSCOPY;  Service: Gastroenterology;  Laterality: N/A;   FLEXIBLE SIGMOIDOSCOPY N/A 10/10/2015   Procedure: FLEXIBLE SIGMOIDOSCOPY;  Surgeon: Deveron Fly, MD;  Location: [facility] ENDOSCOPY;  Service: Endoscopy;  Laterality: N/A;   KNEE SURGERY Right    NM MYOVIEW  LTD  November 2011   No ischemia or infarction. EF 50-55% ( no improvement from 2009 Myoview  EF of 28%   RIGHT HEART CATH N/A 04/28/2022   Procedure: RIGHT HEART CATH;  Surgeon: Sammy Crisp, MD;  Location: [facility] INVASIVE CV LAB;  Service: Cardiovascular;  Laterality: N/A;   TEE WITHOUT CARDIOVERSION N/A 08/16/2014   Procedure: TRANSESOPHAGEAL ECHOCARDIOGRAM (TEE);  Surgeon: Devorah Fonder, MD;  Location: [facility] ORS;  Service: Cardiovascular;  Laterality: N/A;   TEE WITHOUT CARDIOVERSION N/A 03/05/2021   Procedure: TRANSESOPHAGEAL ECHOCARDIOGRAM (TEE);  Surgeon: Constancia Delton, MD;  Location: [facility] ORS;  Service: Cardiovascular;  Laterality: N/A;   TEE WITHOUT CARDIOVERSION N/A 05/01/2022   Procedure: TRANSESOPHAGEAL ECHOCARDIOGRAM;  Surgeon: Darlis Eisenmenger, MD;  Location: [facility] ORS;  Service: Cardiovascular;  Laterality: N/A;  TEE with cardioversion   TEE WITHOUT CARDIOVERSION N/A 01/19/2023   Procedure: TRANSESOPHAGEAL ECHOCARDIOGRAM (TEE);  Surgeon: Darlis Eisenmenger, MD;  Location: [facility] ORS;  Service: Cardiovascular;  Laterality: N/A;   Social History:  reports that he quit smoking about 25 years ago. His smoking use included cigarettes. He started smoking about 37 years ago. He has a 12 pack-year smoking history. He has never used smokeless tobacco. He reports that he does not currently use alcohol. He reports that he does not use drugs.  Allergies  Allergen Reactions   Esomeprazole Magnesium  Other (See Comments)    Suspected interstitial nephritis 2018   Egg-Derived Products Rash    Family History   Problem Relation Age of Onset   Hypertension Mother    Hyperlipidemia Mother    Diabetes Mother     Prior to Admission medications   Medication Sig Start Date End Date Taking? Authorizing Provider  allopurinol  (ZYLOPRIM ) 100 MG tablet Take 100 mg by mouth daily. 05/03/23  Yes [provider]  glipiZIDE (GLUCOTROL XL) 5 MG 24 hr tablet Take 5 mg by mouth daily. 05/03/23  Yes [provider]  metolazone  (ZAROXOLYN ) 2.5 MG tablet Take 2.5 mg by mouth 3 (three) times a week. 05/30/23  Yes [provider]  amiodarone  (PACERONE ) 200 MG tablet Take 1 tablet (200 mg total) by mouth daily. 01/28/23   Charlette Console, FNP  apixaban  (ELIQUIS ) 5 MG TABS tablet Take 1 tablet (5 mg total) by mouth 2 (two) times daily. 01/19/23   Verla Glaze, MD  atorvastatin  (LIPITOR ) 80 MG tablet Take 1 tablet (80 mg total) by mouth daily. 11/24/21   Krishnan, Sendil K, MD  BIDIL  20-37.5 MG tablet TAKE 1/2 (ONE-HALF) TABLET BY MOUTH THREE TIMES DAILY 05/10/23   Bensimhon, Daniel R, MD  calcitRIOL (ROCALTROL) 0.25 MCG capsule Take 0.25 mcg by mouth 3 (three) times a week.    [provider]  empagliflozin  (JARDIANCE ) 10 MG TABS tablet Take 1 tablet (10  mg total) by mouth daily. 05/26/22   Charlette Console, FNP  ferrous sulfate  325 (65 FE) MG EC tablet Take 325 mg by mouth daily with breakfast.    [provider]  gabapentin  (NEURONTIN ) 100 MG capsule Take 1 capsule (100 mg total) by mouth 2 (two) times daily. 01/19/23   Verla Glaze, MD  levothyroxine  (SYNTHROID ) 125 MCG tablet Take 125 mcg by mouth daily before breakfast.    [provider]  lipase/protease/amylase (CREON ) 36000 UNITS CPEP capsule Take 1 capsule (36,000 Units total) by mouth 3 (three) times daily before meals. 01/19/23   Verla Glaze, MD  polyethylene glycol (MIRALAX  / GLYCOLAX ) 17 g packet Take 17 g by mouth daily as needed for mild constipation. 01/19/23   Verla Glaze, MD  torsemide  (DEMADEX )  20 MG tablet Take 40 mg by mouth daily.    [provider]    Physical Exam: Vitals:   06/21/23 0621 06/21/23 0700 06/21/23 0730 06/21/23 0755  BP: (!) 136/105 (!) 151/106 (!) 138/114 (!) 134/97  Pulse: 74 82 80 82  Resp: 20 (!) 22 (!) 22 17  Temp:      TempSrc:      SpO2: 98% 100% 100% 100%  Weight:      Height:       Physical Exam Constitutional:      Appearance: He is obese.  HENT:     Head: Normocephalic and atraumatic.     Nose: Nose normal.  Eyes:     Pupils: Pupils are equal, round, and reactive to light.  Cardiovascular:     Rate and Rhythm: Normal rate and regular rhythm.  Pulmonary:     Effort: Pulmonary effort is normal.  Abdominal:     General: Bowel sounds are normal.     Comments: + mild RUQ tenderness    Musculoskeletal:        General: Normal range of motion.  Skin:    General: Skin is warm.  Neurological:     General: No focal deficit present.  Psychiatric:        Mood and Affect: Mood normal.     Data Reviewed:  There are no new results to review at this time.  CT ABDOMEN PELVIS WO CONTRAST CLINICAL DATA:  Acute nonlocalized abdominal pain, dyspnea  EXAM: CT ABDOMEN AND PELVIS WITHOUT CONTRAST  TECHNIQUE: Multidetector CT imaging of the abdomen and pelvis was performed following the standard protocol without IV contrast.  RADIATION DOSE REDUCTION: This exam was performed according to the departmental dose-optimization program which includes automated exposure control, adjustment of the mA and/or kV according to patient size and/or use of iterative reconstruction technique.  COMPARISON:  None Available.  FINDINGS: Lower chest: No acute abnormality. Moderate coronary artery calcification. Borderline cardiomegaly. Stable right lower lobe nodules benign.  Hepatobiliary: Cholelithiasis without superimposed pericholecystic inflammatory change. Liver unremarkable on this noncontrast examination. No intra or extrahepatic biliary  ductal dilation.  Pancreas: Unremarkable  Spleen: Unremarkable  Adrenals/Urinary Tract: The adrenal glands are unremarkable. The kidneys are normal in position. Moderate bilateral renal cortical atrophy. Numerous simple and hyperdense renal cortical cysts are seen within the kidneys bilaterally, better assessed on MRI examination of 09/26/2022, for which no follow-up imaging is recommended. The kidneys are otherwise unremarkable. The bladder is partially decompressed. There is, however, mild circumferential bladder wall thickening and a small diverticulum noted within bladder dome which together suggest changes of chronic bladder obstruction.  Stomach/Bowel: Moderate pancolonic diverticulosis. Stomach, small bowel, and large bowel are otherwise  unremarkable. Appendix normal. No evidence of obstruction or focal inflammation. No free intraperitoneal gas or fluid.  Vascular/Lymphatic: Aortic atherosclerosis. No enlarged abdominal or pelvic lymph nodes.  Reproductive: Prostate is unremarkable.  Other: Tiny fat containing umbilical hernia  Musculoskeletal: No acute bone abnormality. No lytic or blastic bone lesion. Osseous structures are age appropriate.  IMPRESSION: 1. No acute intra-abdominal pathology identified. 2. Cholelithiasis. 3. Moderate pancolonic diverticulosis.  Aortic Atherosclerosis (ICD10-I70.0).  Electronically Signed   By: Worthy Heads M.D.   On: 06/21/2023 03:58 DG Chest Port 1 View CLINICAL DATA:  CHF, fluid retention  EXAM: PORTABLE CHEST 1 VIEW  COMPARISON:  01/16/2023  FINDINGS: Cardiomegaly with mild interstitial edema. Suspected small bilateral pleural effusions. No pneumothorax.  IMPRESSION: Cardiomegaly with mild interstitial edema. Suspected small bilateral pleural effusions.  Electronically Signed   By: Zadie Herter M.D.   On: 06/21/2023 03:15  Lab Results  Component Value Date   WBC 7.8 06/21/2023   HGB 13.2 06/21/2023    HCT 41.5 06/21/2023   MCV 86.1 06/21/2023   PLT 238 06/21/2023   Last metabolic panel Lab Results  Component Value Date   GLUCOSE 77 06/21/2023   NA 141 06/21/2023   K 3.7 06/21/2023   CL 103 06/21/2023   CO2 25 06/21/2023   BUN 33 (H) 06/21/2023   CREATININE 3.42 (H) 06/21/2023   GFRNONAA 20 (L) 06/21/2023   CALCIUM  8.8 (L) 06/21/2023   PHOS 3.8 12/08/2021   PROT 6.9 06/21/2023   ALBUMIN 3.5 06/21/2023   LABGLOB 2.4 10/08/2020   AGRATIO 1.9 10/08/2020   BILITOT 1.9 (H) 06/21/2023   ALKPHOS 96 06/21/2023   AST 19 06/21/2023   ALT 14 06/21/2023   ANIONGAP 13 06/21/2023    Assessment and Plan: Acute on chronic HFrEF (heart failure with reduced ejection fraction) (HCC) Echocardiogram November 2024 with EF less than 20% in setting of baseline alcoholic cardiomyopathy Noted cardiomegaly as well as pulmonary edema on imaging BNP in the 700 IV diuresis Weight today 121 kg Strict ins and outs and daily weights Cardiology consultation as clinically indicated  Nausea and vomiting Abdominal pain Intractable nausea and vomiting on presentation CT abdomen pelvis grossly stable apart from cholelithiasis Noted T. bili 1.9 on presentation Positive right upper quadrant tenderness on palpation today Will check right upper quadrant ultrasound to better assess Consider general surgery evaluation No active alcohol use Defer IV PPI with concern for drug class associated nephritis and stage 4 chronic kidney disease Antiemetics IV fluid hydration Monitor  Chronic renal impairment, stage 4 (severe) (HCC) Creatinine 3.2 with GFR in the 20s Appears to be near baseline Monitor renal function with diuresis  OSA (obstructive sleep apnea) CPAP  Type II diabetes mellitus with renal manifestations (HCC) Blood sugars in 70s  Monitor   Essential hypertension BP stable Titrate home regimen  Alcohol use, unspecified, in remission Patient reports last alcohol use was 13 years  ago  Paroxysmal atrial flutter (HCC) Rate controlled at present Continue home regimen including amiodarone  and Eliquis  Follow-up cardiology recommendations  NSTEMI (non-ST elevated myocardial infarction) (HCC) Troponin 90s in the setting of overlapping acute on chronic HFrEF Minimal chest pain at present EKG stable Suspect minimal to mild demand ischemia in the setting of acute on chronic HFrEF Stage IV CKD and poor renal clearance also confounding issue Trend troponin as appropriate Continue home regiment Monitor  Alcoholic cirrhosis of liver without ascites (HCC) Stable liver imaging on CT today LFTs within normal Monitor    Greater than 50%  was spent in counseling and coordination of care with patient Total encounter time 80 minutes or more   Advance Care Planning:   Code Status: Prior   Consults: Cardiology   Family Communication: No family at the bedside   Severity of Illness: The appropriate patient status for this patient is OBSERVATION. Observation status is judged to be reasonable and necessary in order to provide the required intensity of service to ensure the patient's safety. The patient's presenting symptoms, physical exam findings, and initial radiographic and laboratory data in the context of their medical condition is felt to place them at decreased risk for further clinical deterioration. Furthermore, it is anticipated that the patient will be medically stable for discharge from the hospital within 2 midnights of admission.   Author: Corrinne Din, MD 06/21/2023 8:12 AM  For on call review [email or web address].

## 2023-06-21 NOTE — Assessment & Plan Note (Signed)
 Blood sugars in 70s  Monitor

## 2023-06-21 NOTE — Assessment & Plan Note (Deleted)
 T. bili 1.9 Suspect secondary to alcohol use CT abdomen pelvis within grossly limits apart from cholelithiasis No acute abdominal pain at present Will otherwise trend and monitor Reassess as appropriate

## 2023-06-21 NOTE — Progress Notes (Signed)
 PT Cancellation Note  Patient Details Name: Hector [name] MRN: [id number] DOB: 17-Dec-1965   Cancelled Treatment:    Reason Eval/Treat Not Completed: Other (comment).  PT consult received.  Chart reviewed.  Pt resting in bed upon PT arrival.  Pt declining PT evaluation during hospitalization d/t not having any therapy needs.  Per OT, pt independent with transfers and walking in room.  PT will sign off.  Amador Junes, PT 06/21/23, 2:52 PM

## 2023-06-21 NOTE — Plan of Care (Signed)

## 2023-06-21 NOTE — ED Triage Notes (Addendum)
 Fluid retention for a couple days. Takes diuretics daily to no avail. History of CHF. Endorses SOB at rest and with exertion. Swelling with redness of legs and abdominal distension with discomfort. Endorses decreased appetite. Denies dizziness, vomiting.

## 2023-06-21 NOTE — Assessment & Plan Note (Signed)
 Stable liver imaging on CT today LFTs within normal Monitor

## 2023-06-21 NOTE — Assessment & Plan Note (Addendum)
 Echocardiogram November 2024 with EF less than 20% in setting of baseline alcoholic cardiomyopathy Noted cardiomegaly as well as pulmonary edema on imaging BNP in the 700 IV diuresis Weight today 121 kg Strict ins and outs and daily weights Cardiology consultation as clinically indicated

## 2023-06-21 NOTE — ED Provider Notes (Signed)
 [facility] Provider Note    Event Date/Time   First MD Initiated Contact with Patient 06/21/23 0234     (approximate)   History   Abdominal pain   HPI  Hector [name] is a 58 y.o. male brought to the ED via EMS from home with a chief complaint of abdominal pain.  Also endorses fluid retention x 2 days.  History of alcoholic cardiomyopathy, no longer drinking, CHF, CKD, hypertension, hyperlipidemia, pancreatitis, atrial fibrillation, history of GI bleed who reports increased swelling in his legs and abdomen.  Endorses decreased appetite, nausea and vomiting.  Denies fever/chills, chest pain, shortness of breath, dysuria or diarrhea.     Past Medical History   Past Medical History:  Diagnosis Date   Alcohol abuse    Alcoholic cardiomyopathy (HCC)    a. 12/2007 MV: EF 28%, no isch;  b. 8/12 Echo: EF 25-35%; c. 02/2014 Echo: EF 20-25%; d. 12/2014 Cath: minimal CAD; e. 01/2015 Echo: EF 50-55%;  d. 05/2016 Echo: EF 30-35%, diff HK, gr2 DD; e. 11/2016 Echo: EF 45-50%, diff HK; f. 09/2021 Echo: EF <20%; g. 11/2021 Echo: EF<20%.   Chronic combined systolic (congestive) and diastolic (congestive) heart failure (HCC)    a. 05/2016 Echo: EF 30-35%; b. 11/2016 Echo: EF 45-50%, diff HK; c. 09/2021 Echo: EF < 20%; d. 11/2021 Echo: EF <20%, no rwma, Nl RV fxn, sev dil LA, mod-sev MR. mod dil PA w/ mildly elev PASP.   CKD (chronic kidney disease), stage III (HCC)    Elevated troponin (chronic)    Essential hypertension    GI bleed 11/2013   Hyperlipidemia    Pancreatitis    Paroxysmal A-fib (HCC)    a. new onset s/p unsuccessful TEE/DCCV on 08/16/2014; b. CHA2DS2VASc = 3-> eliquis  (freq noncompliant); c. 02/2021 s/p TEE/DCCV; d. Prev on amio->d/c 2/2 abnl TFTs.   Paroxysmal atrial flutter (HCC)    Sleep-disordered breathing    Has yet to have a sleep study   Stroke [facility])      Active Problem List   Patient Active Problem List   Diagnosis Date Noted   Polycythemia  01/16/2023   Acute pancreatitis 09/26/2022   Abdominal pain, epigastric 04/27/2022   Alcoholic cirrhosis of liver without ascites (HCC) 04/27/2022   CHF exacerbation (HCC) 04/26/2022   Hyperkalemia 04/25/2022   Hyperbilirubinemia 04/25/2022   Thrombocytopenia (HCC) 04/25/2022   History of GI bleed 04/25/2022   Acute renal failure superimposed on stage 4 chronic kidney disease (HCC) 02/23/2022   Hyponatremia 02/23/2022   Abdominal pain 02/23/2022   Acute kidney injury superimposed on CKD (HCC) 02/23/2022   Type II diabetes mellitus with renal manifestations (HCC) 02/23/2022   RSV infection 02/23/2022   HLD (hyperlipidemia) 01/24/2022   Myocardial injury 01/24/2022   Acute on chronic combined systolic (congestive) and diastolic (congestive) heart failure (HCC) 01/06/2022   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) 12/10/2021   Pulmonary nodule 12/07/2021   Acute on chronic congestive heart failure (HCC) 11/18/2021   Gout 11/18/2021   Atrial fibrillation with RVR (HCC) 10/13/2021   Obesity (BMI 30-39.9) 10/01/2021   Acute on chronic HFrEF (heart failure with reduced ejection fraction) (HCC) 09/28/2021   Right upper quadrant pain 09/10/2021   Nausea and vomiting 09/10/2021   Atrial flutter (HCC) 03/01/2021   Hypothyroidism 11/30/2020   Stroke (HCC)    Alcohol use, unspecified, in remission    Chronic renal impairment, stage 4 (severe) (HCC)    Chronic systolic CHF (congestive  heart failure) (HCC)    Iron  deficiency anemia    CKD (chronic kidney disease) stage 4, GFR 15-29 ml/min (HCC) 07/21/2020   UTI (urinary tract infection) 01/30/2020   Acute renal failure superimposed on stage 3b chronic kidney disease (HCC) 12/31/2019   Acute respiratory failure with hypoxia (HCC) 12/22/2019    Class: Acute   Acute on chronic systolic CHF (congestive heart failure) (HCC) 07/21/2016   OSA (obstructive sleep apnea) 04/21/2016   Nonischemic cardiomyopathy (HCC) 05/26/2015   Chronic combined systolic  (congestive) and diastolic (congestive) heart failure (HCC) 04/01/2015   Essential hypertension 04/01/2015   Nonischemic cardiomyopathy: EF 20-25% 09/21/2014   Paroxysmal A-fib (HCC) 08/16/2014   Paroxysmal atrial flutter (HCC) 08/14/2014    Class: Acute   Hypertensive heart disease    Mixed hyperlipidemia      Past Surgical History   Past Surgical History:  Procedure Laterality Date   CARDIAC CATHETERIZATION N/A 01/09/2015   Procedure: Left Heart Cath and Coronary Angiography;  Surgeon: Arleen Lacer, MD;  Location: [facility] INVASIVE CV LAB;  Service: Cardiovascular;  Laterality: N/A;   CARDIOVERSION N/A 03/05/2021   Procedure: CARDIOVERSION;  Surgeon: Constancia Delton, MD;  Location: [facility] ORS;  Service: Cardiovascular;  Laterality: N/A;   CARDIOVERSION N/A 01/19/2023   Procedure: CARDIOVERSION;  Surgeon: Darlis Eisenmenger, MD;  Location: [facility] ORS;  Service: Cardiovascular;  Laterality: N/A;   CENTRAL LINE INSERTION  04/28/2022   Procedure: CENTRAL LINE INSERTION;  Surgeon: Sammy Crisp, MD;  Location: [facility] INV[facility] CV LAB;  Service: Cardiovascular;;   COLONOSCOPY N/A 07/22/2020   Procedure: COLONOSCOPY;  Surgeon: Toledo, Alphonsus Jeans, MD;  Location: [facility] ENDOSCOPY;  Service: Gastroenterology;  Laterality: N/A;   ELECTROPHYSIOLOGIC STUDY N/A 08/16/2014   Procedure: CARDIOVERSION;  Surgeon: Devorah Fonder, MD;  Location: [facility] ORS;  Service: Cardiovascular;  Laterality: N/A;   ESOPHAGOGASTRODUODENOSCOPY (EGD) WITH PROPOFOL  N/A 05/04/2022   Procedure: ESOPHAGOGASTRODUODENOSCOPY (EGD) WITH PROPOFOL ;  Surgeon: Selena Daily, MD;  Location: [facility] ENDOSCOPY;  Service: Gastroenterology;  Laterality: N/A;   FLEXIBLE SIGMOIDOSCOPY N/A 10/10/2015   Procedure: FLEXIBLE SIGMOIDOSCOPY;  Surgeon: Deveron Fly, MD;  Location: [facility] ENDOSCOPY;  Service: Endoscopy;  Laterality: N/A;   KNEE SURGERY Right    NM MYOVIEW  LTD  November 2011   No ischemia or infarction. EF 50-55% ( no improvement from  2009 Myoview  EF of 28%   RIGHT HEART CATH N/A 04/28/2022   Procedure: RIGHT HEART CATH;  Surgeon: Sammy Crisp, MD;  Location: [facility] INVASIVE CV LAB;  Service: Cardiovascular;  Laterality: N/A;   TEE WITHOUT CARDIOVERSION N/A 08/16/2014   Procedure: TRANSESOPHAGEAL ECHOCARDIOGRAM (TEE);  Surgeon: Devorah Fonder, MD;  Location: [facility] ORS;  Service: Cardiovascular;  Laterality: N/A;   TEE WITHOUT CARDIOVERSION N/A 03/05/2021   Procedure: TRANSESOPHAGEAL ECHOCARDIOGRAM (TEE);  Surgeon: Constancia Delton, MD;  Location: [facility] ORS;  Service: Cardiovascular;  Laterality: N/A;   TEE WITHOUT CARDIOVERSION N/A 05/01/2022   Procedure: TRANSESOPHAGEAL ECHOCARDIOGRAM;  Surgeon: Darlis Eisenmenger, MD;  Location: [facility] ORS;  Service: Cardiovascular;  Laterality: N/A;  TEE with cardioversion   TEE WITHOUT CARDIOVERSION N/A 01/19/2023   Procedure: TRANSESOPHAGEAL ECHOCARDIOGRAM (TEE);  Surgeon: Darlis Eisenmenger, MD;  Location: [facility] ORS;  Service: Cardiovascular;  Laterality: N/A;     Home Medications   Prior to Admission medications   Medication Sig Start Date End Date Taking? Authorizing Provider  amiodarone  (PACERONE ) 200 MG tablet Take 1 tablet (200 mg total) by mouth daily. 01/28/23   Charlette Console, FNP  apixaban  (ELIQUIS )  5 MG TABS tablet Take 1 tablet (5 mg total) by mouth 2 (two) times daily. 01/19/23   Verla Glaze, MD  atorvastatin  (LIPITOR ) 80 MG tablet Take 1 tablet (80 mg total) by mouth daily. 11/24/21   Krishnan, Sendil K, MD  BIDIL  20-37.5 MG tablet TAKE 1/2 (ONE-HALF) TABLET BY MOUTH THREE TIMES DAILY 05/10/23   Bensimhon, Rheta Celestine, MD  empagliflozin  (JARDIANCE ) 10 MG TABS tablet Take 1 tablet (10 mg total) by mouth daily. 05/26/22   Charlette Console, FNP  ferrous sulfate  325 (65 FE) MG EC tablet Take 325 mg by mouth daily with breakfast.    [provider]  gabapentin  (NEURONTIN ) 100 MG capsule Take 1 capsule (100 mg total) by mouth 2 (two) times daily. 01/19/23   Verla Glaze, MD   levothyroxine  (SYNTHROID ) 125 MCG tablet Take 125 mcg by mouth daily before breakfast.    [provider]  lipase/protease/amylase (CREON ) 36000 UNITS CPEP capsule Take 1 capsule (36,000 Units total) by mouth 3 (three) times daily before meals. 01/19/23   Verla Glaze, MD  polyethylene glycol (MIRALAX  / GLYCOLAX ) 17 g packet Take 17 g by mouth daily as needed for mild constipation. 01/19/23   Verla Glaze, MD  torsemide  (DEMADEX ) 20 MG tablet Take 40 mg by mouth daily.    [provider]     Allergies  Esomeprazole magnesium  and Egg-derived products   Family History   Family History  Problem Relation Age of Onset   Hypertension Mother    Hyperlipidemia Mother    Diabetes Mother      Physical Exam  Triage Vital Signs: ED Triage Vitals  Encounter Vitals Group     BP --      Systolic BP Percentile --      Diastolic BP Percentile --      Pulse --      Resp --      Temp --      Temp src --      SpO2 --      Weight 06/21/23 0238 267 lb (121.1 kg)     Height 06/21/23 0238 5\' 11"  (1.803 m)     Head Circumference --      Peak Flow --      Pain Score 06/21/23 0236 7     Pain Loc --      Pain Education --      Exclude from Growth Chart --     Updated Vital Signs: BP (!) 146/118   Pulse 78   Temp 98.4 F (36.9 C) (Oral)   Resp 20   Ht 5\' 11"  (1.803 m)   Wt 121.1 kg   SpO2 95%   BMI 37.24 kg/m    General: Awake, mild distress.  CV:  RRR.  Good peripheral perfusion.  Resp:  Normal effort.  CTAB. Abd:  Morbidly obese.  Mild diffuse tenderness to palpation without rebound or guarding.  Ascites.  No truncal vesicles.  No distention.  Other:  BLE 1+ pitting edema.  LLE warmth/redness.   ED Results / Procedures / Treatments  Labs (all labs ordered are listed, but only abnormal results are displayed) Labs Reviewed  CBC WITH DIFFERENTIAL/PLATELET - Abnormal; Notable for the following components:      Result Value   RDW 17.0 (*)    All other  components within normal limits  COMPREHENSIVE METABOLIC PANEL WITH GFR - Abnormal; Notable for the following components:   BUN 33 (*)    Creatinine, Ser 3.42 (*)  Calcium  8.8 (*)    Total Bilirubin 1.9 (*)    GFR, Estimated 20 (*)    All other components within normal limits  BRAIN NATRIURETIC PEPTIDE - Abnormal; Notable for the following components:   B Natriuretic Peptide 756.8 (*)    All other components within normal limits  URINALYSIS, ROUTINE W REFLEX MICROSCOPIC - Abnormal; Notable for the following components:   Color, Urine YELLOW (*)    APPearance CLEAR (*)    Hgb urine dipstick SMALL (*)    Protein, ur >=300 (*)    Leukocytes,Ua SMALL (*)    Bacteria, UA RARE (*)    All other components within normal limits  TROPONIN I (HIGH SENSITIVITY) - Abnormal; Notable for the following components:   Troponin I (High Sensitivity) 99 (*)    All other components within normal limits  TROPONIN I (HIGH SENSITIVITY) - Abnormal; Notable for the following components:   Troponin I (High Sensitivity) 98 (*)    All other components within normal limits  URINE CULTURE  LIPASE, BLOOD     EKG  ED ECG REPORT I, Aaryana Betke J, the attending physician, personally viewed and interpreted this ECG.   Date: 06/21/2023  EKG Time: 0533  Rate: 78  Rhythm: normal sinus rhythm  Axis: LAD  Intervals:nonspecific intraventricular conduction delay  ST&T Change: Nonspecific    RADIOLOGY I have independently visualized and interpreted patient's imaging studies as well as noted the radiology interpretation:  Chest x-ray: Cardiomegaly, mild interstitial edema, small bilateral pleural effusions  CT abdomen/pelvis: No acute abnormality, cholelithiasis  Official radiology report(s): CT ABDOMEN PELVIS WO CONTRAST Result Date: 06/21/2023 CLINICAL DATA:  Acute nonlocalized abdominal pain, dyspnea EXAM: CT ABDOMEN AND PELVIS WITHOUT CONTRAST TECHNIQUE: Multidetector CT imaging of the abdomen and pelvis  was performed following the standard protocol without IV contrast. RADIATION DOSE REDUCTION: This exam was performed according to the departmental dose-optimization program which includes automated exposure control, adjustment of the mA and/or kV according to patient size and/or use of iterative reconstruction technique. COMPARISON:  None Available. FINDINGS: Lower chest: No acute abnormality. Moderate coronary artery calcification. Borderline cardiomegaly. Stable right lower lobe nodules benign. Hepatobiliary: Cholelithiasis without superimposed pericholecystic inflammatory change. Liver unremarkable on this noncontrast examination. No intra or extrahepatic biliary ductal dilation. Pancreas: Unremarkable Spleen: Unremarkable Adrenals/Urinary Tract: The adrenal glands are unremarkable. The kidneys are normal in position. Moderate bilateral renal cortical atrophy. Numerous simple and hyperdense renal cortical cysts are seen within the kidneys bilaterally, better assessed on MRI examination of 09/26/2022, for which no follow-up imaging is recommended. The kidneys are otherwise unremarkable. The bladder is partially decompressed. There is, however, mild circumferential bladder wall thickening and a small diverticulum noted within bladder dome which together suggest changes of chronic bladder obstruction. Stomach/Bowel: Moderate pancolonic diverticulosis. Stomach, small bowel, and large bowel are otherwise unremarkable. Appendix normal. No evidence of obstruction or focal inflammation. No free intraperitoneal gas or fluid. Vascular/Lymphatic: Aortic atherosclerosis. No enlarged abdominal or pelvic lymph nodes. Reproductive: Prostate is unremarkable. Other: Tiny fat containing umbilical hernia Musculoskeletal: No acute bone abnormality. No lytic or blastic bone lesion. Osseous structures are age appropriate. IMPRESSION: 1. No acute intra-abdominal pathology identified. 2. Cholelithiasis. 3. Moderate pancolonic  diverticulosis. Aortic Atherosclerosis (ICD10-I70.0). Electronically Signed   By: Worthy Heads M.D.   On: 06/21/2023 03:58   DG Chest Port 1 View Result Date: 06/21/2023 CLINICAL DATA:  CHF, fluid retention EXAM: PORTABLE CHEST 1 VIEW COMPARISON:  01/16/2023 FINDINGS: Cardiomegaly with mild interstitial edema. Suspected small bilateral pleural  effusions. No pneumothorax. IMPRESSION: Cardiomegaly with mild interstitial edema. Suspected small bilateral pleural effusions. Electronically Signed   By: Zadie Herter M.D.   On: 06/21/2023 03:15     PROCEDURES:  Critical Care performed: Yes, see critical care procedure note(s)  CRITICAL CARE Performed by: Norlene Beavers   Total critical care time: 45 minutes  Critical care time was exclusive of separately billable procedures and treating other patients.  Critical care was necessary to treat or prevent imminent or life-threatening deterioration.  Critical care was time spent personally by me on the following activities: development of treatment plan with patient and/or surrogate as well as nursing, discussions with consultants, evaluation of patient's response to treatment, examination of patient, obtaining history from patient or surrogate, ordering and performing treatments and interventions, ordering and review of laboratory studies, ordering and review of radiographic studies, pulse oximetry and re-evaluation of patient's condition.   Aaron Aas1-3 Lead EKG Interpretation  Performed by: Norlene Beavers, MD Authorized by: Norlene Beavers, MD     Interpretation: normal     ECG rate:  80   ECG rate assessment: normal     Rhythm: sinus rhythm     Ectopy: none     Conduction: normal   Comments:     Patient placed on cardiac monitor to evaluate for arrhythmias    MEDICATIONS ORDERED IN ED: Medications  hydrALAZINE  (APRESOLINE ) injection 10 mg (has no administration in time range)  bumetanide (BUMEX) injection 1 mg (has no administration in time  range)  cefTRIAXone  (ROCEPHIN ) 1 g in sodium chloride  0.9 % 100 mL IVPB (has no administration in time range)  ondansetron  (ZOFRAN ) injection 4 mg (4 mg Intravenous Given 06/21/23 0300)  morphine  (PF) 4 MG/ML injection 4 mg (4 mg Intravenous Given 06/21/23 0300)     IMPRESSION / MDM / ASSESSMENT AND PLAN / ED COURSE  I reviewed the triage vital signs and the nursing notes.                             58 year old male presenting with abdominal pain. Differential diagnosis includes, but is not limited to, biliary disease (biliary colic, acute cholecystitis, cholangitis, choledocholithiasis, etc), intrathoracic causes for epigastric abdominal pain including ACS, gastritis, duodenitis, pancreatitis, small bowel or large bowel obstruction, abdominal aortic aneurysm, hernia, and ulcer(s).  I personally reviewed patient's records and note a nephrology office visit on 05/13/2023 for stage IV CKD.  Patient's presentation is most consistent with acute complicated illness / injury requiring diagnostic workup.  The patient is on the cardiac monitor to evaluate for evidence of arrhythmia and/or significant heart rate changes.  Will obtain lab work, CT abdomen/pelvis.  Administer IV morphine  for pain, IV Zofran  for nausea/vomiting.  Will reassess.  Clinical Course as of 06/21/23 0611  Mon Jun 21, 2023  0610 CT unremarkable.  Patient remains hypertensive, will administer IV hydralazine .  States he felt better when he was admitted November after "they got fluid off".  In reviewing his hospitalization records, looks like patient was diuresed.  Will administer IV Bumex.  Will consult hospitalist services for evaluation and admission. [JS]    Clinical Course User Index [JS] Norlene Beavers, MD     FINAL CLINICAL IMPRESSION(S) / ED DIAGNOSES   Final diagnoses:  Generalized abdominal pain  Nausea and vomiting, unspecified vomiting type  Calculus of gallbladder without cholecystitis without obstruction   Hypertensive urgency  Acute on chronic congestive heart failure, unspecified heart failure  type [facility])  Urinary tract infection without hematuria, site unspecified     Rx / DC Orders   ED Discharge Orders     None        Note:  This document was prepared using Dragon voice recognition software and may include unintentional dictation errors.   Norlene Beavers, MD 06/21/23 [id number]

## 2023-06-21 NOTE — Assessment & Plan Note (Signed)
 BP stable Titrate home regimen

## 2023-06-21 NOTE — Assessment & Plan Note (Signed)
 Creatinine 3.2 with GFR in the 20s Appears to be near baseline Monitor renal function with diuresis

## 2023-06-21 NOTE — Progress Notes (Signed)
 Heart Failure Stewardship Pharmacy Note  PCP: Dionicia Frater, MD PCP-Cardiologist: Belva Boyden, MD  HPI: Hector [name] is a 58 y.o. male with heart failure with reduced ejection fraction with EF less than 20%, nonischemic cardiomyopathy, history of alcohol use, paroxysmal atrial fibrillation/flutter on Eliquis , obstructive sleep apnea on CPAP, chronic kidney disease stage IV, CVA, hypertension, hyperlipidemia, type 2 diabetes mellitus who presented with worsening abdominal distention, nausea, swelling, and shortness of breath. On admission, BNP was 756.8, HS-troponin was 99, and lipase was 27. Chest x-ray noted cardiomegaly with mild interstitial edema and suspected small bilateral pleural effusions.   Pertinent cardiac history: Echo in 02/2014 with LVEF of 20-25%. Underwent TEE DCCV in 07/2014 where LVEF was 20-25%. LHC in 12/2014 noted no coronary disease. LVEF in 01/2015 improved to 50-55%. Echo in 08/2020 with LVEF 25-30%.Underwent TEE DCCV in 02/2021 where LVEF was <20%. RHC 3/24 RA 17 PA 58/39 (43) PCWP 33 Fick 3.4/1.5 (PA sat 49%) PAPi 1.2. Most recent echo in 12/2022 showed LVEF <20%, severely reduced RV function, mild MR.  Pertinent Lab Values: Creatinine  Date Value Ref Range Status  03/08/2014 2.38 (H) 0.60 - 1.30 mg/dL Final   Creatinine, Ser  Date Value Ref Range Status  06/21/2023 3.42 (H) 0.61 - 1.24 mg/dL Final   BUN  Date Value Ref Range Status  06/21/2023 33 (H) 6 - 20 mg/dL Final  16/11/9602 30 (H) 6 - 24 mg/dL Final  54/10/8117 26 (H) 7 - 18 mg/dL Final   Potassium  Date Value Ref Range Status  06/21/2023 3.7 3.5 - 5.1 mmol/L Final  03/08/2014 3.9 3.5 - 5.1 mmol/L Final   Sodium  Date Value Ref Range Status  06/21/2023 141 135 - 145 mmol/L Final  04/03/2021 141 134 - 144 mmol/L Final  03/08/2014 135 (L) 136 - 145 mmol/L Final   B Natriuretic Peptide  Date Value Ref Range Status  06/21/2023 756.8 (H) 0.0 - 100.0 pg/mL Final    Comment:     Performed at [facility], [address] [place], [place] [address]   Magnesium   Date Value Ref Range Status  05/03/2022 2.7 (H) 1.7 - 2.4 mg/dL Final    Comment:    Performed at [facility], [address] [place], [place] [address]   Hgb A1c MFr Bld  Date Value Ref Range Status  01/17/2023 7.7 (H) 4.8 - 5.6 % Final    Comment:    (NOTE) Pre diabetes:          5.7%-6.4%  Diabetes:              >6.4%  Glycemic control for   <7.0% adults with diabetes    TSH  Date Value Ref Range Status  01/16/2023 23.233 (H) 0.350 - 4.500 uIU/mL Final    Comment:    Performed by a 3rd Generation assay with a functional sensitivity of <=0.01 uIU/mL. Performed at [facility], [address] [place], [place] [address]   10/08/2020 15.900 (H) 0.450 - 4.500 uIU/mL Final    Vital Signs:  Temp:  [98.3 F (36.8 C)-98.4 F (36.9 C)] 98.3 F (36.8 C) (04/28 7846) Pulse Rate:  [74-82] 82 (04/28 0700) Resp:  [17-22] 22 (04/28 0700) BP: (136-154)/(105-135) 151/106 (04/28 0700) SpO2:  [95 %-100 %] 100 % (04/28 0700) Weight:  [121.1 kg (267 lb)] 121.1 kg (267 lb) (04/28 0238) No intake or output data in the 24 hours ending 06/21/23 0731  Current Heart Failure Medications:  Loop diuretic:  torsemide  40 mg daily Beta-Blocker: ACEI/ARB/ARNI: MRA: SGLT2i: Other:  Prior to admission Heart Failure Medications:  Loop diuretic: torsemide  40 mg daily + weekly metolazone  2.5 mg + 40 meq Kcl. Beta-Blocker: none ACEI/ARB/ARNI: none MRA: none SGLT2i: Jardiance  10 mg daily Other: BiDil  0.5 tablet TID  Assessment: 1. Acute on chronic systolic heart failure (LVEF <20%) with severely reduced RV function, due to NICM. NYHA class III-IV symptoms.  -Symptoms: Patient reports fatigue, DOE, mild orthopnea, mild LEE, and GI symptoms associated with abdominal edema. -Volume: Home torsemide  40 mg daily reordered. Patient is hypervolemic on exam and symptoms are consistent with  abdominal edema. Torsemide  is likely to be poorly absorbed with significant abdominal edema. Would consider transitioning to furosemide  80 mg IV BID. -Hemodynamics: BP is elevated. HR 70-80s. -BB: Would not add BB given previous low cardiac output on RHC and current decompensation. -ACEI/ARB/ARNI: Not a candidate at this time given CKD IV. -MRA: Not a candidate at this time given creatinine >2.5 mg/dL. -SGLT2i: Consider restarting Jardiance  10 mg daily -Other: consider restarting BiDil  0.5 tablet TID Plan: 1) Medication changes recommended at this time: -Consider changing torsemide  to furosemide  80 mg IV BID -Consider adding home BiDil  0.5 tablet TID -Consider adding home Jardiance  10 mg daily  2) Patient assistance: -Pending  3) Education: - Patient has been educated on current HF medications and potential additions to HF medication regimen - Patient verbalizes understanding that over the next few months, these medication doses may change and more medications may be added to optimize HF regimen - Patient has been educated on basic disease state pathophysiology and goals of therapy  Medication Assistance / Insurance Benefits Check: Does the patient have prescription insurance?    Type of insurance plan:  Does the patient qualify for medication assistance through manufacturers or grants? Pending    Outpatient Pharmacy: Prior to admission outpatient pharmacy: [facility]      Please do not hesitate to reach out with questions or concerns,  Bevely Brush, PharmD, CPP, BCPS Heart Failure Pharmacist  Phone - [phone] 06/21/2023 3:36 PM

## 2023-06-22 DIAGNOSIS — I480 Paroxysmal atrial fibrillation: Secondary | ICD-10-CM | POA: Diagnosis present

## 2023-06-22 DIAGNOSIS — D631 Anemia in chronic kidney disease: Secondary | ICD-10-CM | POA: Diagnosis present

## 2023-06-22 DIAGNOSIS — N184 Chronic kidney disease, stage 4 (severe): Secondary | ICD-10-CM | POA: Diagnosis present

## 2023-06-22 DIAGNOSIS — J9601 Acute respiratory failure with hypoxia: Secondary | ICD-10-CM | POA: Diagnosis not present

## 2023-06-22 DIAGNOSIS — I272 Pulmonary hypertension, unspecified: Secondary | ICD-10-CM | POA: Diagnosis not present

## 2023-06-22 DIAGNOSIS — E876 Hypokalemia: Secondary | ICD-10-CM | POA: Diagnosis present

## 2023-06-22 DIAGNOSIS — I160 Hypertensive urgency: Secondary | ICD-10-CM | POA: Diagnosis present

## 2023-06-22 DIAGNOSIS — I21A1 Myocardial infarction type 2: Secondary | ICD-10-CM | POA: Diagnosis present

## 2023-06-22 DIAGNOSIS — K7030 Alcoholic cirrhosis of liver without ascites: Secondary | ICD-10-CM | POA: Diagnosis present

## 2023-06-22 DIAGNOSIS — R570 Cardiogenic shock: Secondary | ICD-10-CM | POA: Diagnosis present

## 2023-06-22 DIAGNOSIS — I82412 Acute embolism and thrombosis of left femoral vein: Secondary | ICD-10-CM | POA: Diagnosis present

## 2023-06-22 DIAGNOSIS — I82432 Acute embolism and thrombosis of left popliteal vein: Secondary | ICD-10-CM | POA: Diagnosis present

## 2023-06-22 DIAGNOSIS — N179 Acute kidney failure, unspecified: Secondary | ICD-10-CM | POA: Diagnosis present

## 2023-06-22 DIAGNOSIS — M1000 Idiopathic gout, unspecified site: Secondary | ICD-10-CM | POA: Diagnosis not present

## 2023-06-22 DIAGNOSIS — E785 Hyperlipidemia, unspecified: Secondary | ICD-10-CM | POA: Diagnosis present

## 2023-06-22 DIAGNOSIS — R1084 Generalized abdominal pain: Secondary | ICD-10-CM | POA: Diagnosis present

## 2023-06-22 DIAGNOSIS — E1122 Type 2 diabetes mellitus with diabetic chronic kidney disease: Secondary | ICD-10-CM | POA: Diagnosis present

## 2023-06-22 DIAGNOSIS — I5023 Acute on chronic systolic (congestive) heart failure: Secondary | ICD-10-CM | POA: Diagnosis not present

## 2023-06-22 DIAGNOSIS — I428 Other cardiomyopathies: Secondary | ICD-10-CM | POA: Diagnosis present

## 2023-06-22 DIAGNOSIS — I4892 Unspecified atrial flutter: Secondary | ICD-10-CM | POA: Diagnosis present

## 2023-06-22 DIAGNOSIS — K761 Chronic passive congestion of liver: Secondary | ICD-10-CM | POA: Diagnosis present

## 2023-06-22 DIAGNOSIS — I130 Hypertensive heart and chronic kidney disease with heart failure and stage 1 through stage 4 chronic kidney disease, or unspecified chronic kidney disease: Secondary | ICD-10-CM | POA: Diagnosis not present

## 2023-06-22 DIAGNOSIS — I5084 End stage heart failure: Secondary | ICD-10-CM | POA: Diagnosis present

## 2023-06-22 DIAGNOSIS — E871 Hypo-osmolality and hyponatremia: Secondary | ICD-10-CM | POA: Diagnosis present

## 2023-06-22 DIAGNOSIS — R112 Nausea with vomiting, unspecified: Secondary | ICD-10-CM | POA: Diagnosis not present

## 2023-06-22 DIAGNOSIS — I2721 Secondary pulmonary arterial hypertension: Secondary | ICD-10-CM | POA: Diagnosis present

## 2023-06-22 DIAGNOSIS — F1021 Alcohol dependence, in remission: Secondary | ICD-10-CM | POA: Diagnosis present

## 2023-06-22 DIAGNOSIS — I5043 Acute on chronic combined systolic (congestive) and diastolic (congestive) heart failure: Secondary | ICD-10-CM | POA: Diagnosis present

## 2023-06-22 LAB — CBC
HCT: 41.5 % (ref 39.0–52.0)
Hemoglobin: 12.9 g/dL — ABNORMAL LOW (ref 13.0–17.0)
MCH: 26.7 pg (ref 26.0–34.0)
MCHC: 31.1 g/dL (ref 30.0–36.0)
MCV: 85.7 fL (ref 80.0–100.0)
Platelets: 250 10*3/uL (ref 150–400)
RBC: 4.84 MIL/uL (ref 4.22–5.81)
RDW: 17 % — ABNORMAL HIGH (ref 11.5–15.5)
WBC: 7.7 10*3/uL (ref 4.0–10.5)
nRBC: 0 % (ref 0.0–0.2)

## 2023-06-22 LAB — COMPREHENSIVE METABOLIC PANEL WITH GFR
ALT: 14 U/L (ref 0–44)
AST: 18 U/L (ref 15–41)
Albumin: 3.4 g/dL — ABNORMAL LOW (ref 3.5–5.0)
Alkaline Phosphatase: 90 U/L (ref 38–126)
Anion gap: 6 (ref 5–15)
BUN: 37 mg/dL — ABNORMAL HIGH (ref 6–20)
CO2: 29 mmol/L (ref 22–32)
Calcium: 8.5 mg/dL — ABNORMAL LOW (ref 8.9–10.3)
Chloride: 100 mmol/L (ref 98–111)
Creatinine, Ser: 3.82 mg/dL — ABNORMAL HIGH (ref 0.61–1.24)
GFR, Estimated: 17 mL/min — ABNORMAL LOW (ref >60)
Glucose, Bld: 108 mg/dL — ABNORMAL HIGH (ref 70–99)
Potassium: 4.4 mmol/L (ref 3.5–5.1)
Sodium: 135 mmol/L (ref 135–145)
Total Bilirubin: 1.2 mg/dL (ref 0.0–1.2)
Total Protein: 7 g/dL (ref 6.5–8.1)

## 2023-06-22 LAB — URINE CULTURE: Culture: NO GROWTH

## 2023-06-22 LAB — HIV ANTIBODY (ROUTINE TESTING W REFLEX): HIV Screen 4th Generation wRfx: NONREACTIVE

## 2023-06-22 MED ORDER — METOLAZONE 2.5 MG PO TABS
2.5000 mg | ORAL_TABLET | Freq: Once | ORAL | Status: AC
Start: 2023-06-22 — End: 2023-06-22
  Administered 2023-06-22: 2.5 mg via ORAL
  Filled 2023-06-22: qty 1

## 2023-06-22 MED ORDER — FUROSEMIDE 10 MG/ML IJ SOLN
80.0000 mg | Freq: Two times a day (BID) | INTRAMUSCULAR | Status: DC
  Administered 2023-06-22 – 2023-06-24 (×4): 80 mg via INTRAVENOUS
  Filled 2023-06-22 (×4): qty 8

## 2023-06-22 MED ORDER — EMPAGLIFLOZIN 10 MG PO TABS
10.0000 mg | ORAL_TABLET | Freq: Every day | ORAL | Status: DC
  Administered 2023-06-22 – 2023-06-25 (×3): 10 mg via ORAL
  Filled 2023-06-22 (×4): qty 1

## 2023-06-22 MED ORDER — ISOSORB DINITRATE-HYDRALAZINE 20-37.5 MG PO TABS
0.5000 | ORAL_TABLET | Freq: Three times a day (TID) | ORAL | Status: DC
  Administered 2023-06-22 (×3): 0.5 via ORAL
  Filled 2023-06-22 (×4): qty 0.5

## 2023-06-22 MED ORDER — SODIUM CHLORIDE 0.9 % IV SOLN: INTRAVENOUS | Status: DC

## 2023-06-22 MED ORDER — ATORVASTATIN CALCIUM 80 MG PO TABS
80.0000 mg | ORAL_TABLET | Freq: Every day | ORAL | Status: DC
  Administered 2023-06-22 – 2023-07-01 (×9): 80 mg via ORAL
  Filled 2023-06-22 (×4): qty 1
  Filled 2023-06-22: qty 4
  Filled 2023-06-22 (×4): qty 1

## 2023-06-22 NOTE — Consult Note (Signed)
 Advanced Heart Failure Team Consult Note   Primary Physician: Dionicia Frater, MD Cardiologist:  Timothy Gollan, MD  Reason for Consultation: CHF  HPI:    Hector [name] is seen today for evaluation of CHF at the request of Dr. Daisey Dryer.   Hector [name] is a 58 y.o. male with chronic systolic CHF, ETOH use, DM2, morbid obesity, PAF (h/o GIB on apixaban ),  OSA on CPAP, CKD stage IV (Scr ~3.3), medication noncompliance. HF dates back to at least 2009, thought to be nonischemic in the setting of alcohol use, with an EF of 20% long-term.     Echo 2016 EF 20-25%. LHC 12/2014 minimal CAD   Underwent TEE/DCCV 02/2021: EF <20% with severely reduced RV systolic function.  He was reevaluated by EP in 04/2021 and felt not to be a good candidate for ICD or ablation given medication noncompliance.   Admitted 9/23 & 10/23 with ADHF. Limited echo 10/23 EF < 20% with mod RV dysfunction and moderate to severe MR.  Discharge weight 247 pounds.     Admitted 11/23, 12/23, 1/24 and 3/24 with recurrent HF.    3/24 underwent TEE/DCCV as well as RHC. TEE EF < 20%, mod RV HK, mod MR. RHC with markedly elevated filling pressures and low CO. RA 17, PA 58/39 (43), PCWP 33, Fick 3.4/1.5 (PA sat 49%), PAPi 1.2    Admitted in 11/24 with CHF exacerbation and atypical atrial flutter.  He was diuresed and underwent TEE-guided DCCV back to NSR.  TEE showed EF < 20%, severe RV dysfunction, mild MR.    Patient was admitted from the ER with abdominal pain and dyspnea.  Based on refill history, he probably has not been compliant with his home meds. He has been increasingly dyspneic x 4-5 days, now short of breath walking around the room.  He also has had generalized abdominal pain with nausea and vomiting.  He has had these symptoms with CHF exacerbations in the past and attributes to fluid around his gut. HS-TnI 99 => 98.  Creatinine 3.8, this is around his baseline.  Abdominal US  and abdominal CT showed no acute  pathology.  He has been started on IV Lasix  with some response. Of note, he no longer drinks ETOH.   Home Medications Prior to Admission medications   Medication Sig Start Date End Date Taking? Authorizing Provider  allopurinol  (ZYLOPRIM ) 100 MG tablet Take 100 mg by mouth daily. 05/03/23  Yes [provider]  amiodarone  (PACERONE ) 200 MG tablet Take 1 tablet (200 mg total) by mouth daily. 01/28/23  Yes Hackney, Brian Campanile A, FNP  apixaban  (ELIQUIS ) 5 MG TABS tablet Take 1 tablet (5 mg total) by mouth 2 (two) times daily. 01/19/23  Yes Wieting, Richard, MD  atorvastatin  (LIPITOR ) 80 MG tablet Take 1 tablet (80 mg total) by mouth daily. 11/24/21  Yes Krishnan, Sendil K, MD  BIDIL  20-37.5 MG tablet TAKE 1/2 (ONE-HALF) TABLET BY MOUTH THREE TIMES DAILY 05/10/23  Yes Bensimhon, Daniel R, MD  calcitRIOL (ROCALTROL) 0.25 MCG capsule Take 0.25 mcg by mouth 3 (three) times a week.   Yes [provider]  empagliflozin  (JARDIANCE ) 10 MG TABS tablet Take 1 tablet (10 mg total) by mouth daily. 05/26/22  Yes Hackney, Tina A, FNP  ferrous sulfate  325 (65 FE) MG EC tablet Take 325 mg by mouth daily with breakfast.   Yes [provider]  gabapentin  (NEURONTIN ) 100 MG capsule Take 1 capsule (100 mg total) by mouth 2 (two) times daily.  01/19/23  Yes Wieting, Richard, MD  glipiZIDE (GLUCOTROL XL) 5 MG 24 hr tablet Take 5 mg by mouth daily. 05/03/23  Yes [provider]  levothyroxine  (SYNTHROID ) 125 MCG tablet Take 125 mcg by mouth daily before breakfast.   Yes [provider]  lipase/protease/amylase (CREON ) 36000 UNITS CPEP capsule Take 1 capsule (36,000 Units total) by mouth 3 (three) times daily before meals. 01/19/23  Yes Wieting, Richard, MD  metolazone  (ZAROXOLYN ) 2.5 MG tablet Take 2.5 mg by mouth 3 (three) times a week. 05/30/23  Yes [provider]  polyethylene glycol (MIRALAX  / GLYCOLAX ) 17 g packet Take 17 g by mouth daily as needed for mild constipation. 01/19/23   Yes Wieting, Richard, MD  torsemide  (DEMADEX ) 20 MG tablet Take 40 mg by mouth daily.   Yes [provider]    Past Medical History: Past Medical History:  Diagnosis Date   Alcohol abuse    Alcoholic cardiomyopathy (HCC)    a. 12/2007 MV: EF 28%, no isch;  b. 8/12 Echo: EF 25-35%; c. 02/2014 Echo: EF 20-25%; d. 12/2014 Cath: minimal CAD; e. 01/2015 Echo: EF 50-55%;  d. 05/2016 Echo: EF 30-35%, diff HK, gr2 DD; e. 11/2016 Echo: EF 45-50%, diff HK; f. 09/2021 Echo: EF <20%; g. 11/2021 Echo: EF<20%.   Chronic combined systolic (congestive) and diastolic (congestive) heart failure (HCC)    a. 05/2016 Echo: EF 30-35%; b. 11/2016 Echo: EF 45-50%, diff HK; c. 09/2021 Echo: EF < 20%; d. 11/2021 Echo: EF <20%, no rwma, Nl RV fxn, sev dil LA, mod-sev MR. mod dil PA w/ mildly elev PASP.   CKD (chronic kidney disease), stage III (HCC)    Elevated troponin (chronic)    Essential hypertension    GI bleed 11/2013   Hyperlipidemia    Pancreatitis    Paroxysmal A-fib (HCC)    a. new onset s/p unsuccessful TEE/DCCV on 08/16/2014; b. CHA2DS2VASc = 3-> eliquis  (freq noncompliant); c. 02/2021 s/p TEE/DCCV; d. Prev on amio->d/c 2/2 abnl TFTs.   Paroxysmal atrial flutter (HCC)    Sleep-disordered breathing    Has yet to have a sleep study   Stroke [facility])     Past Surgical History: Past Surgical History:  Procedure Laterality Date   CARDIAC CATHETERIZATION N/A 01/09/2015   Procedure: Left Heart Cath and Coronary Angiography;  Surgeon: Arleen Lacer, MD;  Location: [facility] INVASIVE CV LAB;  Service: Cardiovascular;  Laterality: N/A;   CARDIOVERSION N/A 03/05/2021   Procedure: CARDIOVERSION;  Surgeon: Constancia Delton, MD;  Location: [facility] ORS;  Service: Cardiovascular;  Laterality: N/A;   CARDIOVERSION N/A 01/19/2023   Procedure: CARDIOVERSION;  Surgeon: Darlis Eisenmenger, MD;  Location: [facility] ORS;  Service: Cardiovascular;  Laterality: N/A;   CENTRAL LINE INSERTION  04/28/2022   Procedure: CENTRAL LINE  INSERTION;  Surgeon: Sammy Crisp, MD;  Location: [facility] INVASIVE CV LAB;  Service: Cardiovascular;;   COLONOSCOPY N/A 07/22/2020   Procedure: COLONOSCOPY;  Surgeon: Toledo, Alphonsus Jeans, MD;  Location: [facility] ENDOSCOPY;  Service: Gastroenterology;  Laterality: N/A;   ELECTROPHYSIOLOGIC STUDY N/A 08/16/2014   Procedure: CARDIOVERSION;  Surgeon: Devorah Fonder, MD;  Location: [facility] ORS;  Service: Cardiovascular;  Laterality: N/A;   ESOPHAGOGASTRODUODENOSCOPY (EGD) WITH PROPOFOL  N/A 05/04/2022   Procedure: ESOPHAGOGASTRODUODENOSCOPY (EGD) WITH PROPOFOL ;  Surgeon: Selena Daily, MD;  Location: [facility] ENDOSCOPY;  Service: Gastroenterology;  Laterality: N/A;   FLEXIBLE SIGMOIDOSCOPY N/A 10/10/2015   Procedure: FLEXIBLE SIGMOIDOSCOPY;  Surgeon: Deveron Fly, MD;  Location: [facility] ENDOSCOPY;  Service: Endoscopy;  Laterality:  N/A;   KNEE SURGERY Right    NM MYOVIEW  LTD  November 2011   No ischemia or infarction. EF 50-55% ( no improvement from 2009 Myoview  EF of 28%   RIGHT HEART CATH N/A 04/28/2022   Procedure: RIGHT HEART CATH;  Surgeon: Sammy Crisp, MD;  Location: [facility] INVASIVE CV LAB;  Service: Cardiovascular;  Laterality: N/A;   TEE WITHOUT CARDIOVERSION N/A 08/16/2014   Procedure: TRANSESOPHAGEAL ECHOCARDIOGRAM (TEE);  Surgeon: Devorah Fonder, MD;  Location: [facility] ORS;  Service: Cardiovascular;  Laterality: N/A;   TEE WITHOUT CARDIOVERSION N/A 03/05/2021   Procedure: TRANSESOPHAGEAL ECHOCARDIOGRAM (TEE);  Surgeon: Constancia Delton, MD;  Location: [facility] ORS;  Service: Cardiovascular;  Laterality: N/A;   TEE WITHOUT CARDIOVERSION N/A 05/01/2022   Procedure: TRANSESOPHAGEAL ECHOCARDIOGRAM;  Surgeon: Darlis Eisenmenger, MD;  Location: [facility] ORS;  Service: Cardiovascular;  Laterality: N/A;  TEE with cardioversion   TEE WITHOUT CARDIOVERSION N/A 01/19/2023   Procedure: TRANSESOPHAGEAL ECHOCARDIOGRAM (TEE);  Surgeon: Darlis Eisenmenger, MD;  Location: [facility] ORS;  Service: Cardiovascular;  Laterality: N/A;     Family History: Family History  Problem Relation Age of Onset   Hypertension Mother    Hyperlipidemia Mother    Diabetes Mother     Social History: Social History   Socioeconomic History   Marital status: Single    Spouse name: Not on file   Number of children: Not on file   Years of education: Not on file   Highest education level: Not on file  Occupational History   Not on file  Tobacco Use   Smoking status: Former    Current packs/day: 0.00    Average packs/day: 1 pack/day for 12.0 years (12.0 ttl pk-yrs)    Types: Cigarettes    Start date: 02/23/1986    Quit date: 02/23/1998    Years since quitting: 25.3   Smokeless tobacco: Never  Vaping Use   Vaping status: Never Used  Substance and Sexual Activity   Alcohol use: Not Currently    Alcohol/week: 0.0 standard drinks of alcohol    Comment: Past heavy drinker   Drug use: No   Sexual activity: Not Currently  Other Topics Concern   Not on file  Social History Narrative   Not on file   Social Drivers of Health   Financial Resource Strain: Low Risk  (01/19/2023)   Overall Financial Resource Strain (CARDIA)    Difficulty of Paying Living Expenses: Not hard at all  Food Insecurity: No Food Insecurity (06/21/2023)   Hunger Vital Sign    Worried About Running Out of Food in the Last Year: Never true    Ran Out of Food in the Last Year: Never true  Transportation Needs: No Transportation Needs (06/21/2023)   PRAPARE - Administrator, Civil Service (Medical): No    Lack of Transportation (Non-Medical): No  Physical Activity: Inactive (01/04/2019)   Exercise Vital Sign    Days of Exercise per Week: 0 days    Minutes of Exercise per Session: 0 min  Stress: No Stress Concern Present (01/04/2019)   [employer] of Occupational Health - Occupational Stress Questionnaire    Feeling of Stress : Not at all  Social Connections: Moderately Isolated (01/04/2019)   Social Connection and Isolation Panel  [NHANES]    Frequency of Communication with Friends and Family: More than three times a week    Frequency of Social Gatherings with Friends and Family: More than three times a week    Attends Religious Services:  1 to 4 times per year    Active Member of Clubs or Organizations: No    Attends Banker Meetings: Never    Marital Status: Never married    Allergies:  Allergies  Allergen Reactions   Esomeprazole Magnesium  Other (See Comments)    Suspected interstitial nephritis 2018   Egg-Derived Products Rash    Objective:    Vital Signs:   Temp:  [97.6 F (36.4 C)-99.2 F (37.3 C)] 99.2 F (37.3 C) (04/29 0749) Pulse Rate:  [72-83] 83 (04/29 0749) Resp:  [16-18] 18 (04/29 0749) BP: (128-144)/(83-104) 137/90 (04/29 0749) SpO2:  [88 %-100 %] 92 % (04/29 0749) Last BM Date : 06/21/23  Weight change: Filed Weights   06/21/23 0238  Weight: 121.1 kg    Intake/Output:   Intake/Output Summary (Last 24 hours) at 06/22/2023 0754 Last data filed at 06/22/2023 0500 Gross per 24 hour  Intake 240 ml  Output 250 ml  Net -10 ml      Physical Exam    General:  Well appearing. No resp difficulty HEENT: normal Neck: Thick. JVP 14 cm. Carotids 2+ bilat; no bruits. No lymphadenopathy or thyromegaly appreciated. Cor: PMI nondisplaced. Regular rate & rhythm. No rubs, gallops or murmurs. Lungs: clear Abdomen: soft, nontender, nondistended. No hepatosplenomegaly. No bruits or masses. Good bowel sounds. Extremities: no cyanosis, clubbing, rash. 1+ edema 1/2 to knees bilaterally.  Neuro: alert & orientedx3, cranial nerves grossly intact. moves all 4 extremities w/o difficulty. Affect pleasant   Telemetry   NSR 70s (personally reviewed)  EKG    NSR, IVCD 142 msec (personally reviewed)  Labs   Basic Metabolic Panel: Recent Labs  Lab 06/21/23 0250 06/22/23 0431  NA 141 135  K 3.7 4.4  CL 103 100  CO2 25 29  GLUCOSE 77 108*  BUN 33* 37*  CREATININE 3.42* 3.82*   CALCIUM  8.8* 8.5*    Liver Function Tests: Recent Labs  Lab 06/21/23 0250 06/22/23 0431  AST 19 18  ALT 14 14  ALKPHOS 96 90  BILITOT 1.9* 1.2  PROT 6.9 7.0  ALBUMIN 3.5 3.4*   Recent Labs  Lab 06/21/23 0250  LIPASE 27   No results for input(s): "AMMONIA" in the last 168 hours.  CBC: Recent Labs  Lab 06/21/23 0250 06/22/23 0431  WBC 7.8 7.7  NEUTROABS 5.6  --   HGB 13.2 12.9*  HCT 41.5 41.5  MCV 86.1 85.7  PLT 238 250    Cardiac Enzymes: No results for input(s): "CKTOTAL", "CKMB", "CKMBINDEX", "TROPONINI" in the last 168 hours.  BNP: BNP (last 3 results) Recent Labs    09/25/22 2248 01/16/23 1133 06/21/23 0250  BNP 284.0* 358.6* 756.8*    ProBNP (last 3 results) No results for input(s): "PROBNP" in the last 8760 hours.   CBG: No results for input(s): "GLUCAP" in the last 168 hours.  Coagulation Studies: No results for input(s): "LABPROT", "INR" in the last 72 hours.   Imaging   US  Abdomen Limited RUQ (LIVER/GB) Result Date: 06/21/2023 CLINICAL DATA:  Abdominal pain EXAM: ULTRASOUND ABDOMEN LIMITED RIGHT UPPER QUADRANT COMPARISON:  CT earlier 06/21/2023. MRI August 2024. Ultrasound 04/26/2022 FINDINGS: Gallbladder: Distended gallbladder with stones. No wall thickening or adjacent fluid. No reported sonographic Murphy's sign. Common bile duct: Diameter: 5 mm Liver: Mildly echogenic hepatic parenchyma consistent with fatty liver infiltration. Portal vein is patent on color Doppler imaging with normal direction of blood flow towards the liver. Other: Incidental cyst noted in the right kidney. Please correlate  prior CT scan. This measures 3.2 cm today and is anechoic. IMPRESSION: Gallstones. No ductal dilatation or further sonographic evidence acute cholecystitis at this time. Fatty liver infiltration. Electronically Signed   By: Adrianna Horde M.D.   On: 06/21/2023 14:55     Medications:     Current Medications:  amiodarone   200 mg Oral Daily    apixaban   5 mg Oral BID   atorvastatin   80 mg Oral Daily   empagliflozin   10 mg Oral Daily   furosemide   80 mg Intravenous Q12H   influenza vac split trivalent PF  0.5 mL Intramuscular Tomorrow-1000   isosorbide -hydrALAZINE   0.5 tablet Oral TID   levothyroxine   125 mcg Oral QAC breakfast   lipase/protease/amylase  36,000 Units Oral TID AC   metolazone   2.5 mg Oral Once   sodium chloride  flush  3 mL Intravenous Q12H    Infusions:  sodium chloride         Assessment/Plan   1. Acute on chronic systolic CHF: Nonischemic cardiomyopathy, cath in 2016 with minimal CAD.  Cardiomyopathy since 2009.  TEE in 11/24 showed EF < 20%, severe RV dysfunction, mild MR.  He is volume overloaded on exam, creatinine 3.8 which is likely around his baseline.  Mildly elevated HS-TnI with no trend likely signifies demand ischemia.  - Given history of low output HF and CKD IV, I will arrange for RHC to assess filling pressures and cardiac output. He may need milrinone  for effective diuresis. We discussed risks/benefits and he agrees to the above procedure.  - Lasix  80 mg IV bid today with metolazone  2.5 with next Lasix  dose.  - Continue Jardiance  10 mg daily.  - Restart Bidil  1/2 tab tid, can titrate as needed.  - Hold off on beta blocker for now with h/o low output HF.   - End stage HF, low output on RHC In 3/24, CKD stage IV, and poor compliance with medication regimen and followup.  Therefore, not candidate for advanced therapies and also deemed not candidate for ICD.  2. Atrial arrhythmias: H/o paroxysmal AF. Atypical atrial flutter in 11/24 with DCCV.  He is in NSR this admission.  - Continue amiodarone  200 mg daily for now.  - Continue Eliquis .   3. Abdominal pain: Resolved.  Abdominal imaging unremarkable.  Suspect symptoms related to CHF.  4.CKD stage IV: Creatinine 3.8, up a bit from yesterday but not far off baseline.  Will need to follow closely with diuresis.  May need milrinone  to limit cardiorenal  worsening, will get RHC tomorrow.  5. OSA: CPAP 6. HTN: Use Bidil  for now, can increase as needed.  Avoid hypotension with CKD.   Length of Stay: 0  Peder Bourdon, MD  06/22/2023, 7:54 AM    Advanced Heart Failure Team Pager [phone] (M-F; 7a - 5p)  Please contact [facility] for night-coverage after hours (4p -7a ) and weekends on [email or web address]

## 2023-06-22 NOTE — Progress Notes (Signed)
 Heart Failure Navigator Progress Note  Assessed for Heart & Vascular TOC clinic readiness.  Patient does not meet criteria due to current Advanced Heart Failure Team patient of Marca Ancona, MD.  Navigator will sign off at this time.  Roxy Horseman, RN, BSN [facility] Heart Failure Navigator Secure Chat Only

## 2023-06-22 NOTE — Plan of Care (Signed)

## 2023-06-22 NOTE — Progress Notes (Addendum)
 Progress Note    Hector [name]  ZOX:[id number] DOB: Sep 30, 1965  DOA: 06/21/2023 PCP: Dionicia Frater, MD      Brief Narrative:    Medical records reviewed and are as summarized below:  COCHISE WESP is a 58 y.o. male with medical history significant of heart failure with reduced ejection fraction with EF less than 20%, nonischemic cardiomyopathy, history of alcohol use disorder (quit alcohol about 13 years ago), paroxysmal atrial fibrillation/flutter on Eliquis , OSA on CPAP, chronic kidney disease stage IV, CVA, hypertension, hyperlipidemia, type 2 diabetes mellitus, who presented to the hospital with abdominal pain, nausea, vomiting increasing lower extremity swelling and progressive shortness of breath.  Symptoms had been going on for 4 to 5 days.    He was admitted to the hospital for acute on chronic systolic CHF.     Assessment/Plan:   Principal Problem:   Acute on chronic HFrEF (heart failure with reduced ejection fraction) (HCC) Active Problems:   Nausea and vomiting   Chronic renal impairment, stage 4 (severe) (HCC)   OSA (obstructive sleep apnea)   Type II diabetes mellitus with renal manifestations (HCC)   Hyperbilirubinemia   Essential hypertension   Alcohol use, unspecified, in remission   Gout   Paroxysmal atrial flutter (HCC)   Alcoholic cirrhosis of liver without ascites (HCC)   Intractable vomiting with nausea   NSTEMI (non-ST elevated myocardial infarction) (HCC)     Body mass index is 37.24 kg/m.  (Obesity)   Acute on chronic systolic CHF, nonischemic cardiomyopathy: BNP 756 Continue IV Lasix .  Monitor BMP, daily weight and urine output. Continue isosorbide -hydralazine , Jardiance .  Metolazone  2.5 mg x 1 dose ordered by cardiologist as well. Plan for right heart cath tomorrow. Follow-up with cardiologist. TEE November 2024 showed EF less than 20%.   Acute hypoxic respiratory failure: He is on 2 L/min oxygen.  Wean off  oxygen as able.   Elevated troponins: This is likely from chronic myocardial injury.  Troponins has been chronically elevated in the past.  No NSTEMI.   Nausea, vomiting and abdominal pain: Improved.  This is probably from acute CHF.   Paroxysmal atrial fibrillation/history of atrial flutter with DCCV in November 2024: He is in normal sinus rhythm.  Continue amiodarone  and Eliquis .   CKD stage IV: Creatinine up from 3.42-3.82.  He is still close to baseline.  Monitor BMP while on IV Lasix .   Type II DM: Continue Jardiance    Other comorbidities include history of stroke, hypertension, hyperlipidemia, OSA on CPAP, cholelithiasis, liver cirrhosis, remote history of alcohol use disorder    Diet Order             Diet heart healthy/carb modified Room service appropriate? Yes; Fluid consistency: Thin  Diet effective now                            Consultants: Cardiologist  Procedures: None    Medications:    amiodarone   200 mg Oral Daily   atorvastatin   80 mg Oral Daily   empagliflozin   10 mg Oral Daily   furosemide   80 mg Intravenous Q12H   influenza vac split trivalent PF  0.5 mL Intramuscular Tomorrow-1000   isosorbide -hydrALAZINE   0.5 tablet Oral TID   levothyroxine   125 mcg Oral QAC breakfast   lipase/protease/amylase  36,000 Units Oral TID AC   metolazone   2.5 mg Oral Once   sodium chloride  flush  3 mL Intravenous Q12H  Continuous Infusions:   Anti-infectives (From admission, onward)    Start     Dose/Rate Route Frequency Ordered Stop   06/21/23 0615  cefTRIAXone  (ROCEPHIN ) 1 g in sodium chloride  0.9 % 100 mL IVPB        1 g 200 mL/hr over 30 Minutes Intravenous  Once 06/21/23 0609 06/21/23 0654              Family Communication/Anticipated D/C date and plan/Code Status   DVT prophylaxis:      Code Status: Full Code  Family Communication: None Disposition Plan: Plan to discharge home   Status is: Observation The patient  will require care spanning > 2 midnights and should be moved to inpatient because: Acute on chronic CHF       Subjective:   Interval events.  He complains of shortness of breath.  No chest pain.  Objective:    Vitals:   06/22/23 0006 06/22/23 0527 06/22/23 0749 06/22/23 1124  BP: 128/83 (!) 144/97 (!) 137/90 131/88  Pulse: 72 73 83 71  Resp: 18 18 18 19   Temp: 98.3 F (36.8 C) 98.5 F (36.9 C) 99.2 F (37.3 C) 98.4 F (36.9 C)  TempSrc:      SpO2: 97% 100% 92% 93%  Weight:      Height:       No data found.   Intake/Output Summary (Last 24 hours) at 06/22/2023 1146 Last data filed at 06/22/2023 1100 Gross per 24 hour  Intake 360 ml  Output 700 ml  Net -340 ml   Filed Weights   06/21/23 0238  Weight: 121.1 kg    Exam:  GEN: NAD SKIN: Warm and dry EYES: No pallor or icterus ENT: MMM CV: RRR PULM: Bibasilar rales.  No wheezing ABD: soft, distended, NT, +BS CNS: AAO x 3, non focal EXT: Bilateral leg and pedal edema.  No erythema or tenderness        Data Reviewed:   I have personally reviewed following labs and imaging studies:  Labs: Labs show the following:   Basic Metabolic Panel: Recent Labs  Lab 06/21/23 0250 06/22/23 0431  NA 141 135  K 3.7 4.4  CL 103 100  CO2 25 29  GLUCOSE 77 108*  BUN 33* 37*  CREATININE 3.42* 3.82*  CALCIUM  8.8* 8.5*   GFR Estimated Creatinine Clearance: 27.9 mL/min (A) (by C-G formula based on SCr of 3.82 mg/dL (H)). Liver Function Tests: Recent Labs  Lab 06/21/23 0250 06/22/23 0431  AST 19 18  ALT 14 14  ALKPHOS 96 90  BILITOT 1.9* 1.2  PROT 6.9 7.0  ALBUMIN 3.5 3.4*   Recent Labs  Lab 06/21/23 0250  LIPASE 27   No results for input(s): "AMMONIA" in the last 168 hours. Coagulation profile No results for input(s): "INR", "PROTIME" in the last 168 hours.  CBC: Recent Labs  Lab 06/21/23 0250 06/22/23 0431  WBC 7.8 7.7  NEUTROABS 5.6  --   HGB 13.2 12.9*  HCT 41.5 41.5  MCV 86.1 85.7   PLT 238 250   Cardiac Enzymes: No results for input(s): "CKTOTAL", "CKMB", "CKMBINDEX", "TROPONINI" in the last 168 hours. BNP (last 3 results) No results for input(s): "PROBNP" in the last 8760 hours. CBG: No results for input(s): "GLUCAP" in the last 168 hours. D-Dimer: No results for input(s): "DDIMER" in the last 72 hours. Hgb A1c: No results for input(s): "HGBA1C" in the last 72 hours. Lipid Profile: No results for input(s): "CHOL", "HDL", "LDLCALC", "TRIG", "CHOLHDL", "LDLDIRECT"  in the last 72 hours. Thyroid  function studies: No results for input(s): "TSH", "T4TOTAL", "T3FREE", "THYROIDAB" in the last 72 hours.  Invalid input(s): "FREET3" Anemia work up: No results for input(s): "VITAMINB12", "FOLATE", "FERRITIN", "TIBC", "IRON ", "RETICCTPCT" in the last 72 hours. Sepsis Labs: Recent Labs  Lab 06/21/23 0250 06/22/23 0431  WBC 7.8 7.7    Microbiology Recent Results (from the past 240 hours)  Urine Culture     Status: None   Collection Time: 06/21/23  5:29 AM   Specimen: Urine, Clean Catch  Result Value Ref Range Status   Specimen Description   Final    URINE, CLEAN CATCH Performed at [facility], [address], [place], [place] [address]    Special Requests   Final    NONE Performed at [facility], [address], [place], [place] [address]    Culture   Final    NO GROWTH Performed at [facility], [address] [place], [place] [address]    Report Status 06/22/2023 FINAL  Final    Procedures and diagnostic studies:  US  Abdomen Limited RUQ (LIVER/GB) Result Date: 06/21/2023 CLINICAL DATA:  Abdominal pain EXAM: ULTRASOUND ABDOMEN LIMITED RIGHT UPPER QUADRANT COMPARISON:  CT earlier 06/21/2023. MRI August 2024. Ultrasound 04/26/2022 FINDINGS: Gallbladder: Distended gallbladder with stones. No wall thickening or adjacent fluid. No reported sonographic Murphy's sign. Common bile duct: Diameter: 5 mm Liver: Mildly echogenic hepatic  parenchyma consistent with fatty liver infiltration. Portal vein is patent on color Doppler imaging with normal direction of blood flow towards the liver. Other: Incidental cyst noted in the right kidney. Please correlate prior CT scan. This measures 3.2 cm today and is anechoic. IMPRESSION: Gallstones. No ductal dilatation or further sonographic evidence acute cholecystitis at this time. Fatty liver infiltration. Electronically Signed   By: Adrianna Horde M.D.   On: 06/21/2023 14:55   CT ABDOMEN PELVIS WO CONTRAST Result Date: 06/21/2023 CLINICAL DATA:  Acute nonlocalized abdominal pain, dyspnea EXAM: CT ABDOMEN AND PELVIS WITHOUT CONTRAST TECHNIQUE: Multidetector CT imaging of the abdomen and pelvis was performed following the standard protocol without IV contrast. RADIATION DOSE REDUCTION: This exam was performed according to the departmental dose-optimization program which includes automated exposure control, adjustment of the mA and/or kV according to patient size and/or use of iterative reconstruction technique. COMPARISON:  None Available. FINDINGS: Lower chest: No acute abnormality. Moderate coronary artery calcification. Borderline cardiomegaly. Stable right lower lobe nodules benign. Hepatobiliary: Cholelithiasis without superimposed pericholecystic inflammatory change. Liver unremarkable on this noncontrast examination. No intra or extrahepatic biliary ductal dilation. Pancreas: Unremarkable Spleen: Unremarkable Adrenals/Urinary Tract: The adrenal glands are unremarkable. The kidneys are normal in position. Moderate bilateral renal cortical atrophy. Numerous simple and hyperdense renal cortical cysts are seen within the kidneys bilaterally, better assessed on MRI examination of 09/26/2022, for which no follow-up imaging is recommended. The kidneys are otherwise unremarkable. The bladder is partially decompressed. There is, however, mild circumferential bladder wall thickening and a small diverticulum  noted within bladder dome which together suggest changes of chronic bladder obstruction. Stomach/Bowel: Moderate pancolonic diverticulosis. Stomach, small bowel, and large bowel are otherwise unremarkable. Appendix normal. No evidence of obstruction or focal inflammation. No free intraperitoneal gas or fluid. Vascular/Lymphatic: Aortic atherosclerosis. No enlarged abdominal or pelvic lymph nodes. Reproductive: Prostate is unremarkable. Other: Tiny fat containing umbilical hernia Musculoskeletal: No acute bone abnormality. No lytic or blastic bone lesion. Osseous structures are age appropriate. IMPRESSION: 1. No acute intra-abdominal pathology identified. 2. Cholelithiasis. 3. Moderate pancolonic  diverticulosis. Aortic Atherosclerosis (ICD10-I70.0). Electronically Signed   By: Worthy Heads M.D.   On: 06/21/2023 03:58   DG Chest Port 1 View Result Date: 06/21/2023 CLINICAL DATA:  CHF, fluid retention EXAM: PORTABLE CHEST 1 VIEW COMPARISON:  01/16/2023 FINDINGS: Cardiomegaly with mild interstitial edema. Suspected small bilateral pleural effusions. No pneumothorax. IMPRESSION: Cardiomegaly with mild interstitial edema. Suspected small bilateral pleural effusions. Electronically Signed   By: Zadie Herter M.D.   On: 06/21/2023 03:15               LOS: 0 days   Shanora Christensen  [facility]   Pager on [email or web address]. If 7PM-7AM, please contact night-coverage at [email or web address]     06/22/2023, 11:46 AM

## 2023-06-22 NOTE — Plan of Care (Signed)

## 2023-06-23 ENCOUNTER — Encounter
Admission: EM | Disposition: A | Discharge status: Home / Self Care | Payer: Self-pay | Source: Home / Self Care | Attending: Family Medicine

## 2023-06-23 ENCOUNTER — Other Ambulatory Visit: Payer: Self-pay

## 2023-06-23 DIAGNOSIS — I272 Pulmonary hypertension, unspecified: Secondary | ICD-10-CM | POA: Diagnosis not present

## 2023-06-23 DIAGNOSIS — I5023 Acute on chronic systolic (congestive) heart failure: Secondary | ICD-10-CM | POA: Diagnosis not present

## 2023-06-23 HISTORY — PX: RIGHT HEART CATH: CATH118263

## 2023-06-23 LAB — POCT I-STAT EG7
Acid-Base Excess: 6 mmol/L — ABNORMAL HIGH (ref 0.0–2.0)
Acid-Base Excess: 6 mmol/L — ABNORMAL HIGH (ref 0.0–2.0)
Bicarbonate: 32.6 mmol/L — ABNORMAL HIGH (ref 20.0–28.0)
Bicarbonate: 33.2 mmol/L — ABNORMAL HIGH (ref 20.0–28.0)
Calcium, Ion: 1.1 mmol/L — ABNORMAL LOW (ref 1.15–1.40)
Calcium, Ion: 1.13 mmol/L — ABNORMAL LOW (ref 1.15–1.40)
HCT: 44 % (ref 39.0–52.0)
HCT: 45 % (ref 39.0–52.0)
Hemoglobin: 15 g/dL (ref 13.0–17.0)
Hemoglobin: 15.3 g/dL (ref 13.0–17.0)
O2 Saturation: 55 %
O2 Saturation: 58 %
Potassium: 4.1 mmol/L (ref 3.5–5.1)
Potassium: 4.2 mmol/L (ref 3.5–5.1)
Sodium: 136 mmol/L (ref 135–145)
Sodium: 137 mmol/L (ref 135–145)
TCO2: 34 mmol/L — ABNORMAL HIGH (ref 22–32)
TCO2: 35 mmol/L — ABNORMAL HIGH (ref 22–32)
pCO2, Ven: 55.2 mmHg (ref 44–60)
pCO2, Ven: 55.6 mmHg (ref 44–60)
pH, Ven: 7.38 (ref 7.25–7.43)
pH, Ven: 7.384 (ref 7.25–7.43)
pO2, Ven: 30 mmHg — CL (ref 32–45)
pO2, Ven: 32 mmHg (ref 32–45)

## 2023-06-23 LAB — CBC
HCT: 45.4 % (ref 39.0–52.0)
Hemoglobin: 14.7 g/dL (ref 13.0–17.0)
MCH: 26.9 pg (ref 26.0–34.0)
MCHC: 32.4 g/dL (ref 30.0–36.0)
MCV: 83.2 fL (ref 80.0–100.0)
Platelets: 251 10*3/uL (ref 150–400)
RBC: 5.46 MIL/uL (ref 4.22–5.81)
RDW: 16.7 % — ABNORMAL HIGH (ref 11.5–15.5)
WBC: 7.3 10*3/uL (ref 4.0–10.5)
nRBC: 0 % (ref 0.0–0.2)

## 2023-06-23 LAB — BASIC METABOLIC PANEL WITH GFR
Anion gap: 10 (ref 5–15)
BUN: 44 mg/dL — ABNORMAL HIGH (ref 6–20)
CO2: 29 mmol/L (ref 22–32)
Calcium: 8.9 mg/dL (ref 8.9–10.3)
Chloride: 97 mmol/L — ABNORMAL LOW (ref 98–111)
Creatinine, Ser: 3.88 mg/dL — ABNORMAL HIGH (ref 0.61–1.24)
GFR, Estimated: 17 mL/min — ABNORMAL LOW (ref >60)
Glucose, Bld: 99 mg/dL (ref 70–99)
Potassium: 3.6 mmol/L (ref 3.5–5.1)
Sodium: 136 mmol/L (ref 135–145)

## 2023-06-23 LAB — MAGNESIUM: Magnesium: 2.3 mg/dL (ref 1.7–2.4)

## 2023-06-23 LAB — CREATININE, SERUM
Creatinine, Ser: 3.93 mg/dL — ABNORMAL HIGH (ref 0.61–1.24)
GFR, Estimated: 17 mL/min — ABNORMAL LOW (ref >60)

## 2023-06-23 SURGERY — RIGHT HEART CATH
Anesthesia: Moderate Sedation

## 2023-06-23 MED ORDER — HEPARIN (PORCINE) IN NACL 1000-0.9 UT/500ML-% IV SOLN
INTRAVENOUS | Status: AC
  Filled 2023-06-23: qty 1000

## 2023-06-23 MED ORDER — LIDOCAINE HCL 1 % IJ SOLN
INTRAMUSCULAR | Status: AC
  Filled 2023-06-23: qty 20

## 2023-06-23 MED ORDER — SODIUM CHLORIDE 0.9% FLUSH: 3.0000 mL | INTRAVENOUS | Status: DC | PRN

## 2023-06-23 MED ORDER — SODIUM CHLORIDE 0.9 % IV SOLN: 250.0000 mL | INTRAVENOUS | Status: AC | PRN

## 2023-06-23 MED ORDER — HYDRALAZINE HCL 20 MG/ML IJ SOLN: 10.0000 mg | INTRAMUSCULAR | Status: AC | PRN

## 2023-06-23 MED ORDER — CHLORHEXIDINE GLUCONATE CLOTH 2 % EX PADS
6.0000 | MEDICATED_PAD | Freq: Every day | CUTANEOUS | Status: DC
  Administered 2023-06-25 – 2023-07-01 (×7): 6 via TOPICAL

## 2023-06-23 MED ORDER — ISOSORB DINITRATE-HYDRALAZINE 20-37.5 MG PO TABS
0.5000 | ORAL_TABLET | Freq: Three times a day (TID) | ORAL | Status: DC
  Administered 2023-06-23 – 2023-06-24 (×4): 0.5 via ORAL
  Filled 2023-06-23 (×8): qty 0.5

## 2023-06-23 MED ORDER — SODIUM CHLORIDE 0.9% FLUSH
10.0000 mL | Freq: Two times a day (BID) | INTRAVENOUS | Status: DC
  Administered 2023-06-23 – 2023-07-01 (×13): 10 mL

## 2023-06-23 MED ORDER — POTASSIUM CHLORIDE CRYS ER 20 MEQ PO TBCR
40.0000 meq | EXTENDED_RELEASE_TABLET | Freq: Once | ORAL | Status: AC
  Administered 2023-06-23: 40 meq via ORAL
  Filled 2023-06-23: qty 2

## 2023-06-23 MED ORDER — ONDANSETRON HCL 4 MG/2ML IJ SOLN
4.0000 mg | Freq: Four times a day (QID) | INTRAMUSCULAR | Status: DC | PRN
  Administered 2023-06-23: 4 mg via INTRAVENOUS
  Filled 2023-06-23: qty 2

## 2023-06-23 MED ORDER — LABETALOL HCL 5 MG/ML IV SOLN: 10.0000 mg | INTRAVENOUS | Status: AC | PRN

## 2023-06-23 MED ORDER — MIDAZOLAM HCL 2 MG/2ML IJ SOLN
INTRAMUSCULAR | Status: AC
  Filled 2023-06-23: qty 2

## 2023-06-23 MED ORDER — ACETAMINOPHEN 325 MG PO TABS: 650.0000 mg | ORAL_TABLET | ORAL | Status: DC | PRN

## 2023-06-23 MED ORDER — MILRINONE LACTATE IN DEXTROSE 20-5 MG/100ML-% IV SOLN
0.1250 ug/kg/min | INTRAVENOUS | Status: DC
  Administered 2023-06-23 – 2023-06-27 (×11): 0.25 ug/kg/min via INTRAVENOUS
  Administered 2023-06-28: 0.125 ug/kg/min via INTRAVENOUS
  Administered 2023-06-28: 0.25 ug/kg/min via INTRAVENOUS
  Filled 2023-06-23 (×13): qty 100

## 2023-06-23 MED ORDER — POTASSIUM CHLORIDE CRYS ER 20 MEQ PO TBCR: 40.0000 meq | EXTENDED_RELEASE_TABLET | Freq: Once | ORAL | Status: DC

## 2023-06-23 MED ORDER — HEPARIN SODIUM (PORCINE) 5000 UNIT/ML IJ SOLN
5000.0000 [IU] | Freq: Three times a day (TID) | INTRAMUSCULAR | Status: DC
  Administered 2023-06-23 – 2023-06-24 (×2): 5000 [IU] via SUBCUTANEOUS
  Filled 2023-06-23 (×2): qty 1

## 2023-06-23 MED ORDER — LIDOCAINE HCL (PF) 1 % IJ SOLN
INTRAMUSCULAR | Status: DC | PRN
  Administered 2023-06-23: 2 mL

## 2023-06-23 MED ORDER — HEPARIN (PORCINE) IN NACL 2000-0.9 UNIT/L-% IV SOLN
INTRAVENOUS | Status: DC | PRN
  Administered 2023-06-23: 1000 mL

## 2023-06-23 MED ORDER — METOLAZONE 2.5 MG PO TABS
2.5000 mg | ORAL_TABLET | Freq: Once | ORAL | Status: AC
Start: 2023-06-23 — End: 2023-06-23
  Administered 2023-06-23: 2.5 mg via ORAL
  Filled 2023-06-23 (×2): qty 1

## 2023-06-23 MED ORDER — SODIUM CHLORIDE 0.9% FLUSH
3.0000 mL | Freq: Two times a day (BID) | INTRAVENOUS | Status: DC
  Administered 2023-06-23 – 2023-07-01 (×12): 3 mL via INTRAVENOUS

## 2023-06-23 MED ORDER — SODIUM CHLORIDE 0.9% FLUSH
10.0000 mL | INTRAVENOUS | Status: DC | PRN
  Administered 2023-06-27: 10 mL

## 2023-06-23 MED ORDER — FENTANYL CITRATE (PF) 100 MCG/2ML IJ SOLN
INTRAMUSCULAR | Status: AC
  Filled 2023-06-23: qty 2

## 2023-06-23 MED ORDER — FENTANYL CITRATE (PF) 100 MCG/2ML IJ SOLN
INTRAMUSCULAR | Status: DC | PRN
  Administered 2023-06-23: 25 ug via INTRAVENOUS

## 2023-06-23 SURGICAL SUPPLY — 5 items
CATH BALLN WEDGE 5F 110CM (CATHETERS) IMPLANT
DRAPE BRACHIAL (DRAPES) IMPLANT
PACK CARDIAC CATH (CUSTOM PROCEDURE TRAY) ×1 IMPLANT
SET ATX-X65L (MISCELLANEOUS) IMPLANT
SHEATH GLIDE SLENDER 4/5FR (SHEATH) IMPLANT

## 2023-06-23 NOTE — Progress Notes (Signed)
  Progress Note   Date: 06/23/2023  Patient Name: Hector [name]        MRN#: [id number]   The diagnosis of UTI     has been ruled out

## 2023-06-23 NOTE — Plan of Care (Signed)

## 2023-06-23 NOTE — Progress Notes (Signed)
 Peripherally Inserted Central Catheter Placement  The IV Nurse has discussed with the patient and/or persons authorized to consent for the patient, the purpose of this procedure and the potential benefits and risks involved with this procedure.  The benefits include less needle sticks, lab draws from the catheter, and the patient may be discharged home with the catheter. Risks include, but not limited to, infection, bleeding, blood clot (thrombus formation), and puncture of an artery; nerve damage and irregular heartbeat and possibility to perform a PICC exchange if needed/ordered by physician.  Alternatives to this procedure were also discussed.  Bard Power PICC patient education guide, fact sheet on infection prevention and patient information card has been provided to patient /or left at bedside.    PICC Placement Documentation  PICC Double Lumen 06/23/23 Left Brachial 44 cm 0 cm (Active)  Indication for Insertion or Continuance of Line Vasoactive infusions 06/23/23 1600  Exposed Catheter (cm) 0 cm 06/23/23 1600  Site Assessment Clean, Dry, Intact 06/23/23 1600  Lumen #1 Status Flushed;Saline locked;Blood return noted 06/23/23 1600  Lumen #2 Status Flushed;Saline locked;Blood return noted 06/23/23 1600  Dressing Type Transparent;Securing device 06/23/23 1600  Dressing Status Antimicrobial disc/dressing in place;Clean, Dry, Intact 06/23/23 1600  Line Care Connections checked and tightened 06/23/23 1600  Line Adjustment (NICU/IV Team Only) No 06/23/23 1600  Dressing Intervention New dressing;Adhesive placed at insertion site (IV team only) 06/23/23 1600  Dressing Change Due 06/30/23 06/23/23 1600       Roseanne Cones Renee 06/23/2023, 4:51 PM

## 2023-06-23 NOTE — Interval H&P Note (Signed)
 History and Physical Interval Note:  06/23/2023 11:43 AM  Hector [name]  has presented today for surgery, with the diagnosis of CHF.  The various methods of treatment have been discussed with the patient and family. After consideration of risks, benefits and other options for treatment, the patient has consented to  Procedure(s): RIGHT HEART CATH (N/A) as a surgical intervention.  The patient's history has been reviewed, patient examined, no change in status, stable for surgery.  I have reviewed the patient's chart and labs.  Questions were answered to the patient's satisfaction.     Hector [name] [employer]

## 2023-06-23 NOTE — H&P (View-Only) (Signed)
 [name], male   DOB: 1966-01-06, 58 y.o.   MRN: [id number]     Advanced Heart Failure Rounding Note  Cardiologist: Belva Boyden, MD  Chief Complaint: CHF Subjective:    I/Os net negative 2580, no weight.  Creatinine 3.82 => 3.88.    No dyspnea at rest.    Objective:   Weight Range: 121.1 kg Body mass index is 37.24 kg/m.   Vital Signs:   Temp:  [98 F (36.7 C)-98.7 F (37.1 C)] 98 F (36.7 C) (04/30 0745) Pulse Rate:  [67-77] 71 (04/30 0745) Resp:  [18-20] 18 (04/30 0745) BP: (79-139)/(54-95) 79/54 (04/30 0745) SpO2:  [88 %-94 %] 93 % (04/30 0754) Last BM Date : 06/21/23  Weight change: Filed Weights   06/21/23 0238  Weight: 121.1 kg    Intake/Output:   Intake/Output Summary (Last 24 hours) at 06/23/2023 0852 Last data filed at 06/23/2023 0402 Gross per 24 hour  Intake 120 ml  Output 2700 ml  Net -2580 ml      Physical Exam    General:  Well appearing. No resp difficulty HEENT: Normal Neck: Supple. JVP ?10 cm (difficult). Carotids 2+ bilat; no bruits. No lymphadenopathy or thyromegaly appreciated. Cor: PMI lateral. Regular rate & rhythm. No rubs, gallops or murmurs. Lungs: Clear Abdomen: Soft, nontender, nondistended. No hepatosplenomegaly. No bruits or masses. Good bowel sounds. Extremities: No cyanosis, clubbing, rash. 1+ edema 1/2 to knees bilaterally.  Neuro: Alert & orientedx3, cranial nerves grossly intact. moves all 4 extremities w/o difficulty. Affect pleasant   Telemetry   NSR with PVCs (personally reviewed).    Labs    CBC Recent Labs    06/21/23 0250 06/22/23 0431  WBC 7.8 7.7  NEUTROABS 5.6  --   HGB 13.2 12.9*  HCT 41.5 41.5  MCV 86.1 85.7  PLT 238 250   Basic Metabolic Panel Recent Labs    40/98/11 0431 06/23/23 0408  NA 135 136  K 4.4 3.6  CL 100 97*  CO2 29 29  GLUCOSE 108* 99  BUN 37* 44*  CREATININE 3.82* 3.88*  CALCIUM  8.5* 8.9  MG  --  2.3   Liver Function Tests Recent Labs     06/21/23 0250 06/22/23 0431  AST 19 18  ALT 14 14  ALKPHOS 96 90  BILITOT 1.9* 1.2  PROT 6.9 7.0  ALBUMIN 3.5 3.4*   Recent Labs    06/21/23 0250  LIPASE 27   Cardiac Enzymes No results for input(s): "CKTOTAL", "CKMB", "CKMBINDEX", "TROPONINI" in the last 72 hours.  BNP: BNP (last 3 results) Recent Labs    09/25/22 2248 01/16/23 1133 06/21/23 0250  BNP 284.0* 358.6* 756.8*    ProBNP (last 3 results) No results for input(s): "PROBNP" in the last 8760 hours.   D-Dimer No results for input(s): "DDIMER" in the last 72 hours. Hemoglobin A1C No results for input(s): "HGBA1C" in the last 72 hours. Fasting Lipid Panel No results for input(s): "CHOL", "HDL", "LDLCALC", "TRIG", "CHOLHDL", "LDLDIRECT" in the last 72 hours. Thyroid  Function Tests No results for input(s): "TSH", "T4TOTAL", "T3FREE", "THYROIDAB" in the last 72 hours.  Invalid input(s): "FREET3"  Other results:   Imaging    No results found.   Medications:     Scheduled Medications:  amiodarone   200 mg Oral Daily   atorvastatin   80 mg Oral Daily   empagliflozin   10 mg Oral Daily   furosemide   80 mg Intravenous Q12H   influenza vac split trivalent PF  0.5  mL Intramuscular Tomorrow-1000   isosorbide -hydrALAZINE   0.5 tablet Oral TID   levothyroxine   125 mcg Oral QAC breakfast   lipase/protease/amylase  36,000 Units Oral TID AC   sodium chloride  flush  3 mL Intravenous Q12H    Infusions:  sodium chloride       PRN Medications: morphine  injection, ondansetron  **OR** ondansetron  (ZOFRAN ) IV, sodium chloride  flush    Assessment/Plan   1. Acute on chronic systolic CHF: Nonischemic cardiomyopathy, cath in 2016 with minimal CAD.  Cardiomyopathy since 2009.  TEE in 11/24 showed EF < 20%, severe RV dysfunction, mild MR.  He is volume overloaded on exam still but diuresed well yesterday, creatinine 3.82 => 3.88.  Mildly elevated HS-TnI with no trend likely signifies demand ischemia.  - Given history  of low output HF and CKD IV, I will arrange for RHC to assess filling pressures and cardiac output today. He may need milrinone  for effective diuresis. We discussed risks/benefits and he agrees to the above procedure.  - Lasix  80 mg IV bid today with metolazone  2.5 again with next Lasix  dose.  - Continue Jardiance  10 mg daily.  - Continue Bidil  1/2 tab tid. He had 1 low BP reading today but suspect not accurate as all other BP readings in 120s-140s.  - Hold off on beta blocker for now with h/o low output HF.   - End stage HF, low output on RHC In 3/24, CKD stage IV, and poor compliance with medication regimen and followup.  Therefore, not candidate for advanced therapies and also deemed not candidate for ICD.  2. Atrial arrhythmias: H/o paroxysmal AF. Atypical atrial flutter in 11/24 with DCCV.  He is in NSR this admission.  - Continue amiodarone  200 mg daily for now.  - Continue Eliquis .   3. Abdominal pain: Resolved.  Abdominal imaging unremarkable.  Suspect symptoms related to CHF.  4.CKD stage IV: Creatinine 3.88, stable from yesterday.  Will need to follow closely with diuresis.  May need milrinone  to limit cardiorenal worsening, will get RHC today.  5. OSA: CPAP 6. HTN: Continue Bidil , hold SBP < 90.  Had 1 low BP reading this morning but all other readings normal to elevated (suspect am reading not accurate).    Length of Stay: 1  Peder Bourdon, MD  06/23/2023, 8:52 AM  Advanced Heart Failure Team Pager [phone] (M-F; 7a - 5p)  Please contact [facility] for night-coverage after hours (5p -7a ) and weekends on [email or web address]

## 2023-06-23 NOTE — Progress Notes (Signed)
 PROGRESS NOTE    Hector [name]  WUJ:[id number] DOB: [birth year] DOA: 06/21/2023 PCP: Dionicia Frater, MD  No chief complaint on file.   Hospital Course:  Hector [name] is a 58 year old male with heart failure with reduced EF less than 20%, nonischemic cardiomyopathy, history of alcohol use disorder sober for 13 years, paroxysmal A-fib/flutter on Eliquis , OSA on CPAP, CKD stage IV, CVA, hypertension, hyperlipidemia, type 2 diabetes, presented to the hospital with abdominal pain, nausea, vomiting and increased lower extremity swelling with progressive dyspnea.  He was admitted for acute on chronic CHF exacerbation.  Subjective: Patient evaluated after right heart cath, he has no pain currently. Only complaint at this time is requesting to advance his diet   Objective: Vitals:   06/22/23 2354 06/23/23 0406 06/23/23 0745 06/23/23 0754  BP: (!) 132/94 (!) 129/95 (!) 79/54   Pulse: 76 67 71   Resp: 18 18 18    Temp: 98.5 F (36.9 C) 98.4 F (36.9 C) 98 F (36.7 C)   TempSrc:      SpO2: (!) 88% 94% (!) 88% 93%  Weight:      Height:        Intake/Output Summary (Last 24 hours) at 06/23/2023 0808 Last data filed at 06/23/2023 0402 Gross per 24 hour  Intake 120 ml  Output 2700 ml  Net -2580 ml   Filed Weights   06/21/23 0238  Weight: 121.1 kg    Examination: General exam: Appears calm and comfortable, NAD  Respiratory system: No work of breathing, symmetric chest wall expansion Cardiovascular system: S1 & S2 heard, RRR.  Gastrointestinal system: Abdomen is nondistended, soft and nontender.  Neuro: Alert and oriented. No focal neurological deficits. Extremities: 1+ pitting edema right leg Skin: No rashes, lesions Psychiatry: Demonstrates appropriate judgement and insight. Mood & affect appropriate for situation.   Assessment & Plan:  Principal Problem:   Acute on chronic HFrEF (heart failure with reduced ejection fraction) (HCC) Active Problems:   Nausea and  vomiting   Chronic renal impairment, stage 4 (severe) (HCC)   OSA (obstructive sleep apnea)   Type II diabetes mellitus with renal manifestations (HCC)   Hyperbilirubinemia   Essential hypertension   Alcohol use, unspecified, in remission   Gout   Paroxysmal atrial flutter (HCC)   Alcoholic cirrhosis of liver without ascites (HCC)   Intractable vomiting with nausea   NSTEMI (non-ST elevated myocardial infarction) (HCC)   Acute on chronic systolic CHF Nonischemic cardiomyopathy - BNP 756 on arrival - TEE November 2024: EF less than 20% - RHC 4/30: PCWP elevated, mixed pulmonary venous/pulmonary arterial hypertension.  Low cardiac output.  Has been initiated on milrinone  per cardiology - Home meds include high-dose Lasix , isosorbide , hydralazine , Jardiance  - Further Cardiology recommendations appreciated. - For now we will continue with IV Lasix .  Status post metolazone  2.5 mg x 1  - GDMT as BP allows.  Blood pressure has been trending lower, held BiDil  this morning.  Elevated troponins - Likely secondary to chronic myocardial injury, has been chronically elevated in the past - No STEMI - Cardiology following as above  Hypoxia - Secondary to volume overload.  Diuresis as above  Nausea, vomiting, abdominal pain - Resolved. - Thought to be secondary to decompensated heart failure  Paroxysmal A-fib - DCCV November 2024 - NSR for now - Continue amiodarone  and Eliquis   CKD stage IV - Creatinine baseline near 3.4-3.8.  Continue to monitor BMP closely while on IV Lasix  - Avoid additional nephrotoxic meds if able -  Cardiac condition does necessitate IV diuretics, also on Jardiance   Type 2 diabetes - On Jardiance  as above - Sliding scale insulin , titrate up as needed  History of CVA Hypertension Hyperlipidemia - Continue statin as above  OSA - CPAP at night  Liver cirrhosis Remote history of alcohol use disorder - CMP stable.  BMI 37 Obesity - Outpatient follow up  for lifestyle modification and risk factor management  DVT prophylaxis: Heparin    Code Status: Full Code Disposition:  Inpatient. Decompensated heart failure.  On milrinone .  Cardiology following and managing  Consultants:  Treatment Team:  Consulting Physician: Wenona Hamilton, MD  Procedures:  RHC 4/30  Antimicrobials:  Anti-infectives (From admission, onward)    Start     Dose/Rate Route Frequency Ordered Stop   06/21/23 0615  cefTRIAXone  (ROCEPHIN ) 1 g in sodium chloride  0.9 % 100 mL IVPB        1 g 200 mL/hr over 30 Minutes Intravenous  Once 06/21/23 0609 06/21/23 0654       Data Reviewed: I have personally reviewed following labs and imaging studies CBC: Recent Labs  Lab 06/21/23 0250 06/22/23 0431  WBC 7.8 7.7  NEUTROABS 5.6  --   HGB 13.2 12.9*  HCT 41.5 41.5  MCV 86.1 85.7  PLT 238 250   Basic Metabolic Panel: Recent Labs  Lab 06/21/23 0250 06/22/23 0431 06/23/23 0408  NA 141 135 136  K 3.7 4.4 3.6  CL 103 100 97*  CO2 25 29 29   GLUCOSE 77 108* 99  BUN 33* 37* 44*  CREATININE 3.42* 3.82* 3.88*  CALCIUM  8.8* 8.5* 8.9  MG  --   --  2.3   GFR: Estimated Creatinine Clearance: 27.5 mL/min (A) (by C-G formula based on SCr of 3.88 mg/dL (H)). Liver Function Tests: Recent Labs  Lab 06/21/23 0250 06/22/23 0431  AST 19 18  ALT 14 14  ALKPHOS 96 90  BILITOT 1.9* 1.2  PROT 6.9 7.0  ALBUMIN 3.5 3.4*   CBG: No results for input(s): "GLUCAP" in the last 168 hours.  Recent Results (from the past 240 hours)  Urine Culture     Status: None   Collection Time: 06/21/23  5:29 AM   Specimen: Urine, Clean Catch  Result Value Ref Range Status   Specimen Description   Final    URINE, CLEAN CATCH Performed at [facility], [address] [place], [place] [address]    Special Requests   Final    NONE Performed at [facility], [address], [place], [place] [address]    Culture   Final    NO GROWTH Performed at [facility], [address], [place], [place] [address]    Report Status 06/22/2023 FINAL  Final     Radiology Studies: US  Abdomen Limited RUQ (LIVER/GB) Result Date: 06/21/2023 CLINICAL DATA:  Abdominal pain EXAM: ULTRASOUND ABDOMEN LIMITED RIGHT UPPER QUADRANT COMPARISON:  CT earlier 06/21/2023. MRI August 2024. Ultrasound 04/26/2022 FINDINGS: Gallbladder: Distended gallbladder with stones. No wall thickening or adjacent fluid. No reported sonographic Murphy's sign. Common bile duct: Diameter: 5 mm Liver: Mildly echogenic hepatic parenchyma consistent with fatty liver infiltration. Portal vein is patent on color Doppler imaging with normal direction of blood flow towards the liver. Other: Incidental cyst noted in the right kidney. Please correlate prior CT scan. This measures 3.2 cm today and is anechoic. IMPRESSION: Gallstones. No ductal dilatation or further sonographic evidence acute cholecystitis at this time. Fatty liver infiltration. Electronically Signed  By: Adrianna Horde M.D.   On: 06/21/2023 14:55    Scheduled Meds:  amiodarone   200 mg Oral Daily   atorvastatin   80 mg Oral Daily   empagliflozin   10 mg Oral Daily   furosemide   80 mg Intravenous Q12H   influenza vac split trivalent PF  0.5 mL Intramuscular Tomorrow-1000   isosorbide -hydrALAZINE   0.5 tablet Oral TID   levothyroxine   125 mcg Oral QAC breakfast   lipase/protease/amylase  36,000 Units Oral TID AC   sodium chloride  flush  3 mL Intravenous Q12H   Continuous Infusions:  sodium chloride        LOS: 1 day  MDM: Patient is high risk for one or more organ failure.  They necessitate ongoing hospitalization for continued IV therapies and subsequent lab monitoring. Total time spent interpreting labs and vitals, reviewing the medical record, coordinating care amongst consultants and care team members, directly assessing and discussing care with the patient and/or family: 55 min  Hector Mazor, DO [facility]  To contact  the attending physician between 7A-7P please use Epic Chat. To contact the covering physician during after hours 7P-7A, please review Amion.  06/23/2023, 8:08 AM   *This document has been created with the assistance of dictation software. Please excuse typographical errors. *

## 2023-06-23 NOTE — Progress Notes (Signed)
 [name], male   DOB: 1966-01-06, 58 y.o.   MRN: [id number]     Advanced Heart Failure Rounding Note  Cardiologist: Belva Boyden, MD  Chief Complaint: CHF Subjective:    I/Os net negative 2580, no weight.  Creatinine 3.82 => 3.88.    No dyspnea at rest.    Objective:   Weight Range: 121.1 kg Body mass index is 37.24 kg/m.   Vital Signs:   Temp:  [98 F (36.7 C)-98.7 F (37.1 C)] 98 F (36.7 C) (04/30 0745) Pulse Rate:  [67-77] 71 (04/30 0745) Resp:  [18-20] 18 (04/30 0745) BP: (79-139)/(54-95) 79/54 (04/30 0745) SpO2:  [88 %-94 %] 93 % (04/30 0754) Last BM Date : 06/21/23  Weight change: Filed Weights   06/21/23 0238  Weight: 121.1 kg    Intake/Output:   Intake/Output Summary (Last 24 hours) at 06/23/2023 0852 Last data filed at 06/23/2023 0402 Gross per 24 hour  Intake 120 ml  Output 2700 ml  Net -2580 ml      Physical Exam    General:  Well appearing. No resp difficulty HEENT: Normal Neck: Supple. JVP ?10 cm (difficult). Carotids 2+ bilat; no bruits. No lymphadenopathy or thyromegaly appreciated. Cor: PMI lateral. Regular rate & rhythm. No rubs, gallops or murmurs. Lungs: Clear Abdomen: Soft, nontender, nondistended. No hepatosplenomegaly. No bruits or masses. Good bowel sounds. Extremities: No cyanosis, clubbing, rash. 1+ edema 1/2 to knees bilaterally.  Neuro: Alert & orientedx3, cranial nerves grossly intact. moves all 4 extremities w/o difficulty. Affect pleasant   Telemetry   NSR with PVCs (personally reviewed).    Labs    CBC Recent Labs    06/21/23 0250 06/22/23 0431  WBC 7.8 7.7  NEUTROABS 5.6  --   HGB 13.2 12.9*  HCT 41.5 41.5  MCV 86.1 85.7  PLT 238 250   Basic Metabolic Panel Recent Labs    40/98/11 0431 06/23/23 0408  NA 135 136  K 4.4 3.6  CL 100 97*  CO2 29 29  GLUCOSE 108* 99  BUN 37* 44*  CREATININE 3.82* 3.88*  CALCIUM  8.5* 8.9  MG  --  2.3   Liver Function Tests Recent Labs     06/21/23 0250 06/22/23 0431  AST 19 18  ALT 14 14  ALKPHOS 96 90  BILITOT 1.9* 1.2  PROT 6.9 7.0  ALBUMIN 3.5 3.4*   Recent Labs    06/21/23 0250  LIPASE 27   Cardiac Enzymes No results for input(s): "CKTOTAL", "CKMB", "CKMBINDEX", "TROPONINI" in the last 72 hours.  BNP: BNP (last 3 results) Recent Labs    09/25/22 2248 01/16/23 1133 06/21/23 0250  BNP 284.0* 358.6* 756.8*    ProBNP (last 3 results) No results for input(s): "PROBNP" in the last 8760 hours.   D-Dimer No results for input(s): "DDIMER" in the last 72 hours. Hemoglobin A1C No results for input(s): "HGBA1C" in the last 72 hours. Fasting Lipid Panel No results for input(s): "CHOL", "HDL", "LDLCALC", "TRIG", "CHOLHDL", "LDLDIRECT" in the last 72 hours. Thyroid  Function Tests No results for input(s): "TSH", "T4TOTAL", "T3FREE", "THYROIDAB" in the last 72 hours.  Invalid input(s): "FREET3"  Other results:   Imaging    No results found.   Medications:     Scheduled Medications:  amiodarone   200 mg Oral Daily   atorvastatin   80 mg Oral Daily   empagliflozin   10 mg Oral Daily   furosemide   80 mg Intravenous Q12H   influenza vac split trivalent PF  0.5  mL Intramuscular Tomorrow-1000   isosorbide -hydrALAZINE   0.5 tablet Oral TID   levothyroxine   125 mcg Oral QAC breakfast   lipase/protease/amylase  36,000 Units Oral TID AC   sodium chloride  flush  3 mL Intravenous Q12H    Infusions:  sodium chloride       PRN Medications: morphine  injection, ondansetron  **OR** ondansetron  (ZOFRAN ) IV, sodium chloride  flush    Assessment/Plan   1. Acute on chronic systolic CHF: Nonischemic cardiomyopathy, cath in 2016 with minimal CAD.  Cardiomyopathy since 2009.  TEE in 11/24 showed EF < 20%, severe RV dysfunction, mild MR.  He is volume overloaded on exam still but diuresed well yesterday, creatinine 3.82 => 3.88.  Mildly elevated HS-TnI with no trend likely signifies demand ischemia.  - Given history  of low output HF and CKD IV, I will arrange for RHC to assess filling pressures and cardiac output today. He may need milrinone  for effective diuresis. We discussed risks/benefits and he agrees to the above procedure.  - Lasix  80 mg IV bid today with metolazone  2.5 again with next Lasix  dose.  - Continue Jardiance  10 mg daily.  - Continue Bidil  1/2 tab tid. He had 1 low BP reading today but suspect not accurate as all other BP readings in 120s-140s.  - Hold off on beta blocker for now with h/o low output HF.   - End stage HF, low output on RHC In 3/24, CKD stage IV, and poor compliance with medication regimen and followup.  Therefore, not candidate for advanced therapies and also deemed not candidate for ICD.  2. Atrial arrhythmias: H/o paroxysmal AF. Atypical atrial flutter in 11/24 with DCCV.  He is in NSR this admission.  - Continue amiodarone  200 mg daily for now.  - Continue Eliquis .   3. Abdominal pain: Resolved.  Abdominal imaging unremarkable.  Suspect symptoms related to CHF.  4.CKD stage IV: Creatinine 3.88, stable from yesterday.  Will need to follow closely with diuresis.  May need milrinone  to limit cardiorenal worsening, will get RHC today.  5. OSA: CPAP 6. HTN: Continue Bidil , hold SBP < 90.  Had 1 low BP reading this morning but all other readings normal to elevated (suspect am reading not accurate).    Length of Stay: 1  Peder Bourdon, MD  06/23/2023, 8:52 AM  Advanced Heart Failure Team Pager [phone] (M-F; 7a - 5p)  Please contact [facility] for night-coverage after hours (5p -7a ) and weekends on [email or web address]

## 2023-06-24 ENCOUNTER — Encounter: Payer: Self-pay | Admitting: Cardiology

## 2023-06-24 ENCOUNTER — Inpatient Hospital Stay

## 2023-06-24 DIAGNOSIS — I5023 Acute on chronic systolic (congestive) heart failure: Secondary | ICD-10-CM | POA: Diagnosis not present

## 2023-06-24 DIAGNOSIS — I4892 Unspecified atrial flutter: Secondary | ICD-10-CM

## 2023-06-24 DIAGNOSIS — I214 Non-ST elevation (NSTEMI) myocardial infarction: Secondary | ICD-10-CM

## 2023-06-24 DIAGNOSIS — M1000 Idiopathic gout, unspecified site: Secondary | ICD-10-CM | POA: Diagnosis not present

## 2023-06-24 DIAGNOSIS — E1122 Type 2 diabetes mellitus with diabetic chronic kidney disease: Secondary | ICD-10-CM | POA: Diagnosis not present

## 2023-06-24 DIAGNOSIS — I10 Essential (primary) hypertension: Secondary | ICD-10-CM

## 2023-06-24 DIAGNOSIS — R112 Nausea with vomiting, unspecified: Secondary | ICD-10-CM | POA: Diagnosis not present

## 2023-06-24 DIAGNOSIS — F1091 Alcohol use, unspecified, in remission: Secondary | ICD-10-CM

## 2023-06-24 DIAGNOSIS — K7030 Alcoholic cirrhosis of liver without ascites: Secondary | ICD-10-CM

## 2023-06-24 DIAGNOSIS — G4733 Obstructive sleep apnea (adult) (pediatric): Secondary | ICD-10-CM

## 2023-06-24 LAB — BASIC METABOLIC PANEL WITH GFR
Anion gap: 13 (ref 5–15)
BUN: 53 mg/dL — ABNORMAL HIGH (ref 6–20)
CO2: 29 mmol/L (ref 22–32)
Calcium: 8.9 mg/dL (ref 8.9–10.3)
Chloride: 91 mmol/L — ABNORMAL LOW (ref 98–111)
Creatinine, Ser: 4.28 mg/dL — ABNORMAL HIGH (ref 0.61–1.24)
GFR, Estimated: 15 mL/min — ABNORMAL LOW (ref >60)
Glucose, Bld: 147 mg/dL — ABNORMAL HIGH (ref 70–99)
Potassium: 3.5 mmol/L (ref 3.5–5.1)
Sodium: 133 mmol/L — ABNORMAL LOW (ref 135–145)

## 2023-06-24 LAB — COOXEMETRY PANEL
Carboxyhemoglobin: 1.7 % — ABNORMAL HIGH (ref 0.5–1.5)
Methemoglobin: <0.7 % (ref 0.0–1.5)
O2 Saturation: 66.7 %
Total hemoglobin: 14.7 g/dL (ref 12.0–16.0)
Total oxygen content: 65.3 %

## 2023-06-24 MED ORDER — APIXABAN 5 MG PO TABS
5.0000 mg | ORAL_TABLET | Freq: Two times a day (BID) | ORAL | Status: DC
  Administered 2023-06-24 – 2023-06-25 (×3): 5 mg via ORAL
  Filled 2023-06-24 (×3): qty 1

## 2023-06-24 MED ORDER — POTASSIUM CHLORIDE CRYS ER 20 MEQ PO TBCR: 40.0000 meq | EXTENDED_RELEASE_TABLET | Freq: Once | ORAL | Status: DC

## 2023-06-24 MED ORDER — POTASSIUM CHLORIDE CRYS ER 20 MEQ PO TBCR: 40.0000 meq | EXTENDED_RELEASE_TABLET | ORAL | Status: DC

## 2023-06-24 MED ORDER — POTASSIUM CHLORIDE CRYS ER 20 MEQ PO TBCR
20.0000 meq | EXTENDED_RELEASE_TABLET | Freq: Once | ORAL | Status: AC
  Administered 2023-06-24: 20 meq via ORAL
  Filled 2023-06-24: qty 1

## 2023-06-24 NOTE — Plan of Care (Signed)

## 2023-06-24 NOTE — Progress Notes (Signed)
 PROGRESS NOTE    Hector [name]  ZOX:[id number] DOB: 1965/05/30 DOA: 06/21/2023 PCP: Dionicia Frater, MD  No chief complaint on file.   Hospital Course:  Hector [name] is a 58 year old male with heart failure with reduced EF less than 20%, nonischemic cardiomyopathy, history of alcohol use disorder sober for 13 years, paroxysmal A-fib/flutter on Eliquis , OSA on CPAP, CKD stage IV, CVA, hypertension, hyperlipidemia, type 2 diabetes, presented to the hospital with abdominal pain, nausea, vomiting and increased lower extremity swelling with progressive dyspnea.  He was admitted for acute on chronic CHF exacerbation.  Subjective: No acute events overnight. Patient has no acute complaints today.   Objective: Vitals:   06/23/23 2332 06/24/23 0435 06/24/23 0610 06/24/23 0808  BP: 119/63 122/80  (!) 113/95  Pulse:      Resp: 18 20    Temp: 97.8 F (36.6 C) 98 F (36.7 C)  98 F (36.7 C)  TempSrc: Oral Oral    SpO2: (!) 89% (!) 89%  91%  Weight:   128.1 kg   Height:        Intake/Output Summary (Last 24 hours) at 06/24/2023 1002 Last data filed at 06/24/2023 0934 Gross per 24 hour  Intake 223.41 ml  Output 2925 ml  Net -2701.59 ml   Filed Weights   06/21/23 0238 06/24/23 0610  Weight: 121.1 kg 128.1 kg    Examination: General exam: Appears calm and comfortable, NAD  Respiratory system: No work of breathing, symmetric chest wall expansion Cardiovascular system: S1 & S2 heard, RRR.  Gastrointestinal system: Abdomen is nondistended, soft and nontender.  Neuro: Alert and oriented. No focal neurological deficits. Extremities: 1+ pitting edema right leg Skin: No rashes, lesions Psychiatry: Demonstrates appropriate judgement and insight. Mood & affect appropriate for situation.   Assessment & Plan:  Principal Problem:   Acute on chronic HFrEF (heart failure with reduced ejection fraction) (HCC) Active Problems:   Nausea and vomiting   Chronic renal impairment,  stage 4 (severe) (HCC)   OSA (obstructive sleep apnea)   Type II diabetes mellitus with renal manifestations (HCC)   Hyperbilirubinemia   Essential hypertension   Alcohol use, unspecified, in remission   Gout   Paroxysmal atrial flutter (HCC)   Alcoholic cirrhosis of liver without ascites (HCC)   Intractable vomiting with nausea   NSTEMI (non-ST elevated myocardial infarction) (HCC)   Acute on chronic systolic CHF Nonischemic cardiomyopathy - BNP 756 on arrival - TEE November 2024: EF less than 20% - RHC 4/30: PCWP elevated, mixed pulmonary venous/pulmonary arterial hypertension.  Low cardiac output.  Has been initiated on milrinone  per cardiology.  Titration per cardiology based on Co-ox - Home meds include high-dose Lasix , isosorbide , hydralazine , Jardiance  - Further Cardiology recommendations appreciated. - Worsening kidney function.  Will discontinue Lasix  for now.  Status post metolazone  2.5 mg x 1  - GDMT as BP allows.  Blood pressure has been trending lower, held BiDil  this morning.  Lower extremity edema - Likely secondary to heart failure as above, however does have marked asymmetry with worsening swelling on the left.  Have ordered for Dopplers to rule out DVT.  Elevated troponins - Likely secondary to chronic myocardial injury, has been chronically elevated in the past - No STEMI - Cardiology following as above  Hypoxia - Secondary to volume overload.  Diuresis as above - Continue to wean O2 as tolerated.  Nausea, vomiting, abdominal pain - Resolved. - Thought to be secondary to decompensated heart failure  Paroxysmal A-fib -  DCCV November 2024 - NSR for now - Continue amiodarone  and Eliquis   CKD stage IV - Creatinine baseline near 3.4-3.8. - Creatinine with acute worsening today.  Likely secondary to IV Lasix  - Appears to be developing cardiorenal syndrome.  Nephrology was consulted.  At this time patient maintains good output - Continue to follow strict  I's and O's - No urgent need for hemodialysis but remains high risk. - Avoid additional nephrotoxic meds if able - Cardiac condition does necessitate IV diuretics, also on Jardiance   Type 2 diabetes - On Jardiance  as above - Sliding scale insulin , titrate up as needed  History of CVA Hypertension Hyperlipidemia - Continue statin as above  OSA - CPAP at night  Liver cirrhosis Remote history of alcohol use disorder - CMP stable.  BMI 37 Obesity - Outpatient follow up for lifestyle modification and risk factor management  DVT prophylaxis: Heparin    Code Status: Full Code Disposition:  Inpatient. Decompensated heart failure.  On milrinone .  Cardiology following and managing  Consultants:  Treatment Team:  Consulting Physician: Wenona Hamilton, MD  Procedures:  RHC 4/30  Antimicrobials:  Anti-infectives (From admission, onward)    Start     Dose/Rate Route Frequency Ordered Stop   06/21/23 0615  cefTRIAXone  (ROCEPHIN ) 1 g in sodium chloride  0.9 % 100 mL IVPB        1 g 200 mL/hr over 30 Minutes Intravenous  Once 06/21/23 0609 06/21/23 0654       Data Reviewed: I have personally reviewed following labs and imaging studies CBC: Recent Labs  Lab 06/21/23 0250 06/22/23 0431 06/23/23 1302 06/23/23 1548  WBC 7.8 7.7  --  7.3  NEUTROABS 5.6  --   --   --   HGB 13.2 12.9* 15.0  15.3 14.7  HCT 41.5 41.5 44.0  45.0 45.4  MCV 86.1 85.7  --  83.2  PLT 238 250  --  251   Basic Metabolic Panel: Recent Labs  Lab 06/21/23 0250 06/22/23 0431 06/23/23 0408 06/23/23 1302 06/23/23 1548 06/24/23 0444  NA 141 135 136 137  136  --  133*  K 3.7 4.4 3.6 4.1  4.2  --  3.5  CL 103 100 97*  --   --  91*  CO2 25 29 29   --   --  29  GLUCOSE 77 108* 99  --   --  147*  BUN 33* 37* 44*  --   --  53*  CREATININE 3.42* 3.82* 3.88*  --  3.93* 4.28*  CALCIUM  8.8* 8.5* 8.9  --   --  8.9  MG  --   --  2.3  --   --   --    GFR: Estimated Creatinine Clearance: 25.7 mL/min  (A) (by C-G formula based on SCr of 4.28 mg/dL (H)). Liver Function Tests: Recent Labs  Lab 06/21/23 0250 06/22/23 0431  AST 19 18  ALT 14 14  ALKPHOS 96 90  BILITOT 1.9* 1.2  PROT 6.9 7.0  ALBUMIN 3.5 3.4*   CBG: No results for input(s): "GLUCAP" in the last 168 hours.  Recent Results (from the past 240 hours)  Urine Culture     Status: None   Collection Time: 06/21/23  5:29 AM   Specimen: Urine, Clean Catch  Result Value Ref Range Status   Specimen Description   Final    URINE, CLEAN CATCH Performed at [facility], [address], [place], [place] [address]    Special Requests  Final    NONE Performed at [facility], [address] [place], [place] [address]    Culture   Final    NO GROWTH Performed at [facility], [address] [place], [place] [address]    Report Status 06/22/2023 FINAL  Final     Radiology Studies: US  EKG SITE RITE Result Date: 06/23/2023 If Site Rite image not attached, placement could not be confirmed due to current cardiac rhythm.  CARDIAC CATHETERIZATION Result Date: 06/23/2023 1. PCWP remains elevated. 2. Mixed pulmonary venous/pulmonary arterial HTN (?OSA playing a role). 3. Low cardiac output I will initiate milrinone  0.25 mcg/kg/min    Scheduled Meds:  amiodarone   200 mg Oral Daily   apixaban   5 mg Oral BID   atorvastatin   80 mg Oral Daily   Chlorhexidine  Gluconate Cloth  6 each Topical Daily   empagliflozin   10 mg Oral Daily   influenza vac split trivalent PF  0.5 mL Intramuscular Tomorrow-1000   isosorbide -hydrALAZINE   0.5 tablet Oral TID   levothyroxine   125 mcg Oral QAC breakfast   lipase/protease/amylase  36,000 Units Oral TID AC   sodium chloride  flush  10-40 mL Intracatheter Q12H   sodium chloride  flush  3 mL Intravenous Q12H   sodium chloride  flush  3 mL Intravenous Q12H   Continuous Infusions:  sodium chloride      milrinone  0.25 mcg/kg/min (06/24/23 0934)     LOS: 2 days  MDM:  Patient is high risk for one or more organ failure.  They necessitate ongoing hospitalization for continued IV therapies and subsequent lab monitoring. Total time spent interpreting labs and vitals, reviewing the medical record, coordinating care amongst consultants and care team members, directly assessing and discussing care with the patient and/or family: 55 min  Christorpher Hisaw, DO [facility]  To contact the attending physician between 7A-7P please use Epic Chat. To contact the covering physician during after hours 7P-7A, please review Amion.  06/24/2023, 10:02 AM   *This document has been created with the assistance of dictation software. Please excuse typographical errors. *

## 2023-06-24 NOTE — Progress Notes (Signed)
 Patient is refusing scheduled CHG bath. Pt education on the intended purpose of the intervention, yet continues to refuse.

## 2023-06-24 NOTE — Progress Notes (Signed)
 [facility]  ROUNDING NOTE   Subjective:   Hector [name]  is a 58 y.o. male with past medical conditions including hypertension, paroxysmal A-fib on Eliquis , CHF with a EF less than 20%, and chronic kidney disease stage IV.  Patient presents to the emergency department complaining of shortness of breath and swelling and has been admitted for Generalized abdominal pain [R10.84] Hypertensive urgency [I16.0] Calculus of gallbladder without cholecystitis without obstruction [K80.20] Urinary tract infection without hematuria, site unspecified [N39.0] Intractable vomiting with nausea [R11.2] Acute on chronic congestive heart failure, unspecified heart failure type (HCC) [I50.9] Nausea and vomiting, unspecified vomiting type [R11.2]  Patient is known to our practice from previous admissions and is currently followed by [facility] nephrology.  Patient states he began having shortness of breath about a week prior to presentation.  States he has been compliant with all medications including diuretics.  Labs on ED arrival concerning for creatinine 3.42 with GFR 28, BNP 756, troponin 99.  UA appears clear with protein.  Urine culture negative.  Chest x-ray shows cardiomegaly with mild interstitial edema.  CT abdomen pelvis showed no acute abdominal findings.  Cardiology consulted and did perform right-sided heart cath on 4/30.  Cath did show mixed pulmonary venous/arterial hypertension with low cardiac output.  Was placed on milrinone  drip.  We have been consulted to evaluate acute kidney injury.  Objective:  Vital signs in last 24 hours:  Temp:  [97.8 F (36.6 C)-98.6 F (37 C)] 98.5 F (36.9 C) (05/01 1232) Pulse Rate:  [74-101] 100 (05/01 1240) Resp:  [18-23] 20 (05/01 0435) BP: (93-126)/(61-95) 96/75 (05/01 1232) SpO2:  [89 %-93 %] 90 % (05/01 1232) Weight:  [128.1 kg] 128.1 kg (05/01 0610)  Weight change:  Filed Weights   06/21/23 0238 06/24/23 0610  Weight: 121.1 kg 128.1 kg     Intake/Output: I/O last 3 completed shifts: In: 93.6 [I.V.:93.6] Out: 5475 [Urine:5475]   Intake/Output this shift:  Total I/O In: 369.8 [P.O.:240; I.V.:129.8] Out: 600 [Urine:600]  Physical Exam: General: NAD  Head: Normocephalic, atraumatic. Moist oral mucosal membranes  Eyes: Anicteric  Lungs:  Clear to auscultation, normal effort  Heart: Regular rate and rhythm  Abdomen:  Soft, nontender,   Extremities: No peripheral edema.  Neurologic: Alert and oriented, moving all four extremities  Skin: No lesions  Access: None    Basic Metabolic Panel: Recent Labs  Lab 06/21/23 0250 06/22/23 0431 06/23/23 0408 06/23/23 1302 06/23/23 1548 06/24/23 0444  NA 141 135 136 137  136  --  133*  K 3.7 4.4 3.6 4.1  4.2  --  3.5  CL 103 100 97*  --   --  91*  CO2 25 29 29   --   --  29  GLUCOSE 77 108* 99  --   --  147*  BUN 33* 37* 44*  --   --  53*  CREATININE 3.42* 3.82* 3.88*  --  3.93* 4.28*  CALCIUM  8.8* 8.5* 8.9  --   --  8.9  MG  --   --  2.3  --   --   --     Liver Function Tests: Recent Labs  Lab 06/21/23 0250 06/22/23 0431  AST 19 18  ALT 14 14  ALKPHOS 96 90  BILITOT 1.9* 1.2  PROT 6.9 7.0  ALBUMIN 3.5 3.4*   Recent Labs  Lab 06/21/23 0250  LIPASE 27   No results for input(s): "AMMONIA" in the last 168 hours.  CBC: Recent Labs  Lab 06/21/23 0250 06/22/23 0431 06/23/23 1302 06/23/23 1548  WBC 7.8 7.7  --  7.3  NEUTROABS 5.6  --   --   --   HGB 13.2 12.9* 15.0  15.3 14.7  HCT 41.5 41.5 44.0  45.0 45.4  MCV 86.1 85.7  --  83.2  PLT 238 250  --  251    Cardiac Enzymes: No results for input(s): "CKTOTAL", "CKMB", "CKMBINDEX", "TROPONINI" in the last 168 hours.  BNP: Invalid input(s): "POCBNP"  CBG: No results for input(s): "GLUCAP" in the last 168 hours.  Microbiology: Results for orders placed or performed during the hospital encounter of 06/21/23  Urine Culture     Status: None   Collection Time: 06/21/23  5:29 AM   Specimen:  Urine, Clean Catch  Result Value Ref Range Status   Specimen Description   Final    URINE, CLEAN CATCH Performed at [facility], [address], [place], [place] [address]    Special Requests   Final    NONE Performed at [facility], [address] [place], [place] [address]    Culture   Final    NO GROWTH Performed at [facility], [address], [place], [place] [address]    Report Status 06/22/2023 FINAL  Final    Coagulation Studies: No results for input(s): "LABPROT", "INR" in the last 72 hours.  Urinalysis: No results for input(s): "COLORURINE", "LABSPEC", "PHURINE", "GLUCOSEU", "HGBUR", "BILIRUBINUR", "KETONESUR", "PROTEINUR", "UROBILINOGEN", "NITRITE", "LEUKOCYTESUR" in the last 72 hours.  Invalid input(s): "APPERANCEUR"    Imaging: US  EKG SITE RITE Result Date: 06/23/2023 If Site Rite image not attached, placement could not be confirmed due to current cardiac rhythm.  CARDIAC CATHETERIZATION Result Date: 06/23/2023 1. PCWP remains elevated. 2. Mixed pulmonary venous/pulmonary arterial HTN (?OSA playing a role). 3. Low cardiac output I will initiate milrinone  0.25 mcg/kg/min     Medications:    milrinone  0.25 mcg/kg/min (06/24/23 0934)    amiodarone   200 mg Oral Daily   apixaban   5 mg Oral BID   atorvastatin   80 mg Oral Daily   Chlorhexidine  Gluconate Cloth  6 each Topical Daily   empagliflozin   10 mg Oral Daily   influenza vac split trivalent PF  0.5 mL Intramuscular Tomorrow-1000   isosorbide -hydrALAZINE   0.5 tablet Oral TID   levothyroxine   125 mcg Oral QAC breakfast   lipase/protease/amylase  36,000 Units Oral TID AC   sodium chloride  flush  10-40 mL Intracatheter Q12H   sodium chloride  flush  3 mL Intravenous Q12H   sodium chloride  flush  3 mL Intravenous Q12H   acetaminophen , morphine  injection, ondansetron  **OR** ondansetron  (ZOFRAN ) IV, ondansetron  (ZOFRAN ) IV, sodium chloride  flush, sodium chloride  flush, sodium  chloride flush  Assessment/ Plan:  Hector [name] is a 58 y.o.  male  with past medical conditions including hypertension, paroxysmal A-fib on Eliquis , CHF with a EF less than 20%, and chronic kidney disease stage IV.  Patient presents to the emergency department complaining of shortness of breath and swelling and has been admitted for Generalized abdominal pain [R10.84] Hypertensive urgency [I16.0] Calculus of gallbladder without cholecystitis without obstruction [K80.20] Urinary tract infection without hematuria, site unspecified [N39.0] Intractable vomiting with nausea [R11.2] Acute on chronic congestive heart failure, unspecified heart failure type (HCC) [I50.9] Nausea and vomiting, unspecified vomiting type [R11.2]   Acute Kidney Injury on chronic kidney disease stage 4 with baseline creatinine 3.35 and GFR of 20 on 05/06/2023.  Acute kidney injury secondary to  hemodynamic instability and suspected cardiorenal syndrome Patient did undergo right heart cath however contrast is usually not utilized for this.  No acute indication for dialysis.  Patient would benefit from a diuretic, will follow-up with cardiology.  Will continue to monitor renal function.  Lab Results  Component Value Date   CREATININE 4.28 (H) 06/24/2023   CREATININE 3.93 (H) 06/23/2023   CREATININE 3.88 (H) 06/23/2023    Intake/Output Summary (Last 24 hours) at 06/24/2023 1428 Last data filed at 06/24/2023 1200 Gross per 24 hour  Intake 463.41 ml  Output 2475 ml  Net -2011.59 ml    2.  Chronic systolic heart failure.  TEE echo completed on 01/19/2023 shows EF less than 20%.  Cardiology following and performed right heart cath on 4/30 which showed mixed pulmonary venous/arterial hypertension with low cardiac output and PCWP elevation.  Patient was placed on milrinone  drip.  3. Anemia of chronic kidney disease Lab Results  Component Value Date   HGB 14.7 06/23/2023    Hemoglobin within desired range at this  time.  Will monitor for now.   LOS: 2 Jolee Critcher 5/1/20252:28 PM

## 2023-06-24 NOTE — Progress Notes (Signed)
 [name], male   DOB: Jul 12, 1965, 58 y.o.   MRN: [id number]     Advanced Heart Failure Rounding Note  Cardiologist: Belva Boyden, MD  Chief Complaint: CHF Subjective:    I/Os net negative 2831, weight does not appear accurate.  Creatinine 3.82 => 3.88 => 4.28.    Breathing is improved.   RHC Procedural Findings: Hemodynamics (mmHg) RA mean 8 RV 48/11 PA 55/39, mean 39 PCWP mean 21 Oxygen saturations: PA 57% AO 93% Cardiac Output (Fick) 4.01  Cardiac Index (Fick) 1.68 PVR 4.5 WU PAPi 2    Objective:   Weight Range: 128.1 kg Body mass index is 39.4 kg/m.   Vital Signs:   Temp:  [97.8 F (36.6 C)-98.6 F (37 C)] 98 F (36.7 C) (05/01 0435) Pulse Rate:  [62-145] 80 (04/30 1941) Resp:  [11-25] 20 (05/01 0435) BP: (93-142)/(61-109) 122/80 (05/01 0435) SpO2:  [89 %-96 %] 89 % (05/01 0435) Weight:  [128.1 kg] 128.1 kg (05/01 0610) Last BM Date : 06/21/23  Weight change: Filed Weights   06/21/23 0238 06/24/23 0610  Weight: 121.1 kg 128.1 kg    Intake/Output:   Intake/Output Summary (Last 24 hours) at 06/24/2023 0807 Last data filed at 06/23/2023 2300 Gross per 24 hour  Intake 93.58 ml  Output 2925 ml  Net -2831.42 ml      Physical Exam    General: NAD Neck: JVP 7-8 cm, no thyromegaly or thyroid  nodule.  Lungs: Clear to auscultation bilaterally with normal respiratory effort. CV: Lateral PMI.  Heart regular S1/S2, no S3/S4, no murmur.  1+ ankle edema on right, 1+ edema 3/4 to knee on left.  Abdomen: Soft, nontender, no hepatosplenomegaly, no distention.  Skin: Intact without lesions or rashes.  Neurologic: Alert and oriented x 3.  Psych: Normal affect. Extremities: No clubbing or cyanosis.  HEENT: Normal.   Telemetry   NSR 70s (personally reviewed).    Labs    CBC Recent Labs    06/22/23 0431 06/23/23 1302 06/23/23 1548  WBC 7.7  --  7.3  HGB 12.9* 15.0  15.3 14.7  HCT 41.5 44.0  45.0 45.4  MCV 85.7  --  83.2   PLT 250  --  251   Basic Metabolic Panel Recent Labs    40/98/11 0408 06/23/23 1302 06/23/23 1548 06/24/23 0444  NA 136 137  136  --  133*  K 3.6 4.1  4.2  --  3.5  CL 97*  --   --  91*  CO2 29  --   --  29  GLUCOSE 99  --   --  147*  BUN 44*  --   --  53*  CREATININE 3.88*  --  3.93* 4.28*  CALCIUM  8.9  --   --  8.9  MG 2.3  --   --   --    Liver Function Tests Recent Labs    06/22/23 0431  AST 18  ALT 14  ALKPHOS 90  BILITOT 1.2  PROT 7.0  ALBUMIN 3.4*   No results for input(s): "LIPASE", "AMYLASE" in the last 72 hours.  Cardiac Enzymes No results for input(s): "CKTOTAL", "CKMB", "CKMBINDEX", "TROPONINI" in the last 72 hours.  BNP: BNP (last 3 results) Recent Labs    09/25/22 2248 01/16/23 1133 06/21/23 0250  BNP 284.0* 358.6* 756.8*    ProBNP (last 3 results) No results for input(s): "PROBNP" in the last 8760 hours.   D-Dimer No results for input(s): "DDIMER" in the last 72  hours. Hemoglobin A1C No results for input(s): "HGBA1C" in the last 72 hours. Fasting Lipid Panel No results for input(s): "CHOL", "HDL", "LDLCALC", "TRIG", "CHOLHDL", "LDLDIRECT" in the last 72 hours. Thyroid  Function Tests No results for input(s): "TSH", "T4TOTAL", "T3FREE", "THYROIDAB" in the last 72 hours.  Invalid input(s): "FREET3"  Other results:   Imaging    US  EKG SITE RITE Result Date: 06/23/2023 If Site Rite image not attached, placement could not be confirmed due to current cardiac rhythm.  CARDIAC CATHETERIZATION Result Date: 06/23/2023 1. PCWP remains elevated. 2. Mixed pulmonary venous/pulmonary arterial HTN (?OSA playing a role). 3. Low cardiac output I will initiate milrinone  0.25 mcg/kg/min     Medications:     Scheduled Medications:  amiodarone   200 mg Oral Daily   atorvastatin   80 mg Oral Daily   Chlorhexidine  Gluconate Cloth  6 each Topical Daily   empagliflozin   10 mg Oral Daily   heparin   5,000 Units Subcutaneous Q8H   influenza vac  split trivalent PF  0.5 mL Intramuscular Tomorrow-1000   isosorbide -hydrALAZINE   0.5 tablet Oral TID   levothyroxine   125 mcg Oral QAC breakfast   lipase/protease/amylase  36,000 Units Oral TID AC   sodium chloride  flush  10-40 mL Intracatheter Q12H   sodium chloride  flush  3 mL Intravenous Q12H   sodium chloride  flush  3 mL Intravenous Q12H    Infusions:  sodium chloride      milrinone  0.25 mcg/kg/min (06/24/23 0013)    PRN Medications: sodium chloride , acetaminophen , morphine  injection, ondansetron  **OR** ondansetron  (ZOFRAN ) IV, ondansetron  (ZOFRAN ) IV, sodium chloride  flush, sodium chloride  flush, sodium chloride  flush    Assessment/Plan   1. Acute on chronic systolic CHF: Nonischemic cardiomyopathy, cath in 2016 with minimal CAD.  Cardiomyopathy since 2009.  TEE in 11/24 showed EF < 20%, severe RV dysfunction, mild MR.  Mildly elevated HS-TnI this admission with no trend likely signifies demand ischemia.   RHC yesterday with mildly elevated filling pressures and low CI 1.68.  Patient was started on milrinone  0.25 and diuresis was continued.  Good diuresis yesterday, volume status looks better.  Creatinine 3.82 => 3.88 => 4.28.   - Stop IV Lasix .  - Continue milrinone  0.25 to allow stabilization of renal function then will wean off.  Still waiting for co-ox to be sent down.  - Continue Jardiance  10 mg daily.  - Continue Bidil  1/2 tab tid.  - Hold off on beta blocker with low output HF.   - End stage HF, low output on RHC In 3/24, CKD stage IV, and poor compliance with medication regimen and followup.  Therefore, not candidate for advanced therapies and also deemed not candidate for ICD.  2. Atrial arrhythmias: H/o paroxysmal AF. Atypical atrial flutter in 11/24 with DCCV.  He is in NSR this admission.  - Continue amiodarone  200 mg daily for now.  - Restart Eliquis .   3. Abdominal pain: Resolved.  Abdominal imaging unremarkable.  Suspect symptoms related to CHF.  4.CKD stage IV:  Creatinine 3.88 => 4.28 with diuresis. - Stop Lasix .  - Continue milrinone  0.25 today to maintain CO while we allow renal function to stabilize.   5. OSA: CPAP 6. HTN: BP controlled, avoid hypotension.  7. Asymmetric edema: Unlikely to be DVT with anticoagulation but clear asymmetry of legs.   - Will get venous dopplers.    Length of Stay: 2  Peder Bourdon, MD  06/24/2023, 8:07 AM  Advanced Heart Failure Team Pager [phone] (M-F; 7a - 5p)  Please contact  [facility] for night-coverage after hours (5p -7a ) and weekends on [email or web address]

## 2023-06-24 NOTE — Plan of Care (Signed)
  Problem: Education: Goal: Knowledge of General Education information will improve Description: Including pain rating scale, medication(s)/side effects and non-pharmacologic comfort measures Outcome: Progressing   Problem: Clinical Measurements: Goal: Ability to maintain clinical measurements within normal limits will improve Outcome: Progressing   Problem: Clinical Measurements: Goal: Respiratory complications will improve Outcome: Progressing   Problem: Clinical Measurements: Goal: Cardiovascular complication will be avoided Outcome: Progressing   Problem: Pain Managment: Goal: General experience of comfort will improve and/or be controlled Outcome: Progressing   Problem: Safety: Goal: Ability to remain free from injury will improve Outcome: Progressing   Problem: Cardiovascular: Goal: Ability to achieve and maintain adequate cardiovascular perfusion will improve Outcome: Progressing

## 2023-06-25 DIAGNOSIS — I5023 Acute on chronic systolic (congestive) heart failure: Secondary | ICD-10-CM | POA: Diagnosis not present

## 2023-06-25 LAB — COOXEMETRY PANEL
Carboxyhemoglobin: 1.9 % — ABNORMAL HIGH (ref 0.5–1.5)
Methemoglobin: <0.7 % (ref 0.0–1.5)
O2 Saturation: 70.8 %
Total hemoglobin: 14.4 g/dL (ref 12.0–16.0)
Total oxygen content: 69.5 %

## 2023-06-25 LAB — COMPREHENSIVE METABOLIC PANEL WITH GFR
ALT: 13 U/L (ref 0–44)
AST: 21 U/L (ref 15–41)
Albumin: 3.7 g/dL (ref 3.5–5.0)
Alkaline Phosphatase: 103 U/L (ref 38–126)
Anion gap: 13 (ref 5–15)
BUN: 58 mg/dL — ABNORMAL HIGH (ref 6–20)
CO2: 28 mmol/L (ref 22–32)
Calcium: 8.8 mg/dL — ABNORMAL LOW (ref 8.9–10.3)
Chloride: 89 mmol/L — ABNORMAL LOW (ref 98–111)
Creatinine, Ser: 4.98 mg/dL — ABNORMAL HIGH (ref 0.61–1.24)
GFR, Estimated: 13 mL/min — ABNORMAL LOW (ref >60)
Glucose, Bld: 129 mg/dL — ABNORMAL HIGH (ref 70–99)
Potassium: 3.6 mmol/L (ref 3.5–5.1)
Sodium: 130 mmol/L — ABNORMAL LOW (ref 135–145)
Total Bilirubin: 1.7 mg/dL — ABNORMAL HIGH (ref 0.0–1.2)
Total Protein: 7.5 g/dL (ref 6.5–8.1)

## 2023-06-25 LAB — CBC WITH DIFFERENTIAL/PLATELET
Abs Immature Granulocytes: 0.04 10*3/uL (ref 0.00–0.07)
Basophils Absolute: 0.1 10*3/uL (ref 0.0–0.1)
Basophils Relative: 1 %
Eosinophils Absolute: 0.3 10*3/uL (ref 0.0–0.5)
Eosinophils Relative: 4 %
HCT: 43.7 % (ref 39.0–52.0)
Hemoglobin: 14.4 g/dL (ref 13.0–17.0)
Immature Granulocytes: 1 %
Lymphocytes Relative: 14 %
Lymphs Abs: 1.2 10*3/uL (ref 0.7–4.0)
MCH: 27.2 pg (ref 26.0–34.0)
MCHC: 33 g/dL (ref 30.0–36.0)
MCV: 82.6 fL (ref 80.0–100.0)
Monocytes Absolute: 0.9 10*3/uL (ref 0.1–1.0)
Monocytes Relative: 10 %
Neutro Abs: 6.3 10*3/uL (ref 1.7–7.7)
Neutrophils Relative %: 70 %
Platelets: 273 10*3/uL (ref 150–400)
RBC: 5.29 MIL/uL (ref 4.22–5.81)
RDW: 16.5 % — ABNORMAL HIGH (ref 11.5–15.5)
WBC: 8.8 10*3/uL (ref 4.0–10.5)
nRBC: 0 % (ref 0.0–0.2)

## 2023-06-25 LAB — LIPOPROTEIN A (LPA): Lipoprotein (a): 146 nmol/L — ABNORMAL HIGH (ref <75.0)

## 2023-06-25 LAB — PHOSPHORUS: Phosphorus: 4.9 mg/dL — ABNORMAL HIGH (ref 2.5–4.6)

## 2023-06-25 LAB — MAGNESIUM: Magnesium: 2.3 mg/dL (ref 1.7–2.4)

## 2023-06-25 MED ORDER — APIXABAN 5 MG PO TABS: 5.0000 mg | ORAL_TABLET | Freq: Two times a day (BID) | ORAL | Status: DC

## 2023-06-25 MED ORDER — APIXABAN 5 MG PO TABS
10.0000 mg | ORAL_TABLET | Freq: Two times a day (BID) | ORAL | Status: DC
  Administered 2023-06-25 – 2023-07-01 (×12): 10 mg via ORAL
  Filled 2023-06-25 (×12): qty 2

## 2023-06-25 NOTE — Plan of Care (Signed)

## 2023-06-25 NOTE — Progress Notes (Signed)
 [name], male   DOB: May 16, 1965, 58 y.o.   MRN: [id number]     Advanced Heart Failure Rounding Note  Cardiologist: Belva Boyden, MD  Chief Complaint: CHF Subjective:    I/Os net negative 2831, weight does not appear accurate.  Creatinine 3.82 => 3.88 => 4.28 => 4.98.  Co-ox 71%.    Venous dopplers showed DVT on left.   Breathing stable.   RHC Procedural Findings: Hemodynamics (mmHg) RA mean 8 RV 48/11 PA 55/39, mean 39 PCWP mean 21 Oxygen saturations: PA 57% AO 93% Cardiac Output (Fick) 4.01  Cardiac Index (Fick) 1.68 PVR 4.5 WU PAPi 2    Objective:   Weight Range: 128.1 kg Body mass index is 39.4 kg/m.   Vital Signs:   Temp:  [98.2 F (36.8 C)-98.6 F (37 C)] 98.2 F (36.8 C) (05/02 0337) Pulse Rate:  [58-106] 101 (05/02 0834) Resp:  [16-20] 20 (05/02 0834) BP: (89-120)/(50-76) 120/50 (05/02 0834) SpO2:  [89 %-94 %] 94 % (05/02 0834) Last BM Date : 06/21/23  Weight change: Filed Weights   06/21/23 0238 06/24/23 0610  Weight: 121.1 kg 128.1 kg    Intake/Output:   Intake/Output Summary (Last 24 hours) at 06/25/2023 1226 Last data filed at 06/25/2023 0800 Gross per 24 hour  Intake 203.81 ml  Output 1725 ml  Net -1521.19 ml      Physical Exam    General: NAD Neck: No JVD, no thyromegaly or thyroid  nodule.  Lungs: Clear to auscultation bilaterally with normal respiratory effort. CV: Nondisplaced PMI.  Heart regular S1/S2, no S3/S4, no murmur.  1+ edema left lower leg.  Abdomen: Soft, nontender, no hepatosplenomegaly, no distention.  Skin: Intact without lesions or rashes.  Neurologic: Alert and oriented x 3.  Psych: Normal affect. Extremities: No clubbing or cyanosis.  HEENT: Normal.    Telemetry   NSR 70s (personally reviewed).    Labs    CBC Recent Labs    06/23/23 1548 06/25/23 0520  WBC 7.3 8.8  NEUTROABS  --  6.3  HGB 14.7 14.4  HCT 45.4 43.7  MCV 83.2 82.6  PLT 251 273   Basic Metabolic  Panel Recent Labs    06/23/23 0408 06/23/23 1302 06/24/23 0444 06/25/23 0520  NA 136   < > 133* 130*  K 3.6   < > 3.5 3.6  CL 97*  --  91* 89*  CO2 29  --  29 28  GLUCOSE 99  --  147* 129*  BUN 44*  --  53* 58*  CREATININE 3.88*   < > 4.28* 4.98*  CALCIUM  8.9  --  8.9 8.8*  MG 2.3  --   --  2.3  PHOS  --   --   --  4.9*   < > = values in this interval not displayed.   Liver Function Tests Recent Labs    06/25/23 0520  AST 21  ALT 13  ALKPHOS 103  BILITOT 1.7*  PROT 7.5  ALBUMIN 3.7   No results for input(s): "LIPASE", "AMYLASE" in the last 72 hours.  Cardiac Enzymes No results for input(s): "CKTOTAL", "CKMB", "CKMBINDEX", "TROPONINI" in the last 72 hours.  BNP: BNP (last 3 results) Recent Labs    09/25/22 2248 01/16/23 1133 06/21/23 0250  BNP 284.0* 358.6* 756.8*    ProBNP (last 3 results) No results for input(s): "PROBNP" in the last 8760 hours.   D-Dimer No results for input(s): "DDIMER" in the last 72 hours. Hemoglobin A1C No  results for input(s): "HGBA1C" in the last 72 hours. Fasting Lipid Panel No results for input(s): "CHOL", "HDL", "LDLCALC", "TRIG", "CHOLHDL", "LDLDIRECT" in the last 72 hours. Thyroid  Function Tests No results for input(s): "TSH", "T4TOTAL", "T3FREE", "THYROIDAB" in the last 72 hours.  Invalid input(s): "FREET3"  Other results:   Imaging    US  Venous Img Lower Bilateral (DVT) Result Date: 06/24/2023 CLINICAL DATA:  Bilateral leg edema EXAM: BILATERAL LOWER EXTREMITY VENOUS DOPPLER ULTRASOUND TECHNIQUE: Gray-scale sonography with compression, as well as color and duplex ultrasound, were performed to evaluate the deep venous system(s) from the level of the common femoral vein through the popliteal and proximal calf veins. COMPARISON:  None Available. FINDINGS: VENOUS Nonocclusive thrombus noted in the left mid to distal femoral vein and left popliteal vein. Patent left common femoral vein and profundus femoral vein and patent  left calf veins. No right lower extremity DVT is demonstrated. OTHER None. Limitations: none IMPRESSION: 1. Nonocclusive thrombus in the LEFT mid and distal femoral vein and left popliteal vein compatible with acute DVT. 2. No findings of RIGHT lower extremity DVT. Electronically Signed   By: Freida Jes M.D.   On: 06/24/2023 17:40     Medications:     Scheduled Medications:  amiodarone   200 mg Oral Daily   apixaban   5 mg Oral BID   atorvastatin   80 mg Oral Daily   Chlorhexidine  Gluconate Cloth  6 each Topical Daily   empagliflozin   10 mg Oral Daily   influenza vac split trivalent PF  0.5 mL Intramuscular Tomorrow-1000   levothyroxine   125 mcg Oral QAC breakfast   lipase/protease/amylase  36,000 Units Oral TID AC   sodium chloride  flush  10-40 mL Intracatheter Q12H   sodium chloride  flush  3 mL Intravenous Q12H   sodium chloride  flush  3 mL Intravenous Q12H    Infusions:  milrinone  0.25 mcg/kg/min (06/25/23 0739)    PRN Medications: acetaminophen , morphine  injection, ondansetron  **OR** ondansetron  (ZOFRAN ) IV, ondansetron  (ZOFRAN ) IV, sodium chloride  flush, sodium chloride  flush, sodium chloride  flush    Assessment/Plan   1. Acute on chronic systolic CHF: Nonischemic cardiomyopathy, cath in 2016 with minimal CAD.  Cardiomyopathy since 2009.  TEE in 11/24 showed EF < 20%, severe RV dysfunction, mild MR.  Mildly elevated HS-TnI this admission with no trend likely signifies demand ischemia.   RHC yesterday with mildly elevated filling pressures and low CI 1.68.  Patient was started on milrinone  0.25 and diuresis was continued.  He now looks euvolemic but creatinine trending up.  Creatinine 3.82 => 3.88 => 4.28 => 4.98.  Good co-ox 71%. - He is off Lasix .  - Will stop Bidil  for now to try to keep BP higher.   - Continue milrinone  0.25 to allow stabilization of renal function then will wean off.    - Hold Jardiance , restart when creatinine trending down.  - Hold off on beta  blocker with low output HF.   - End stage HF, low output on RHC In 3/24, CKD stage IV, and poor compliance with medication regimen and followup.  Therefore, not candidate for advanced therapies and also deemed not candidate for ICD.  2. Atrial arrhythmias: H/o paroxysmal AF. Atypical atrial flutter in 11/24 with DCCV.  He is in NSR this admission.  - Continue amiodarone  200 mg daily.  - Continue apixaban .    3. Abdominal pain: Resolved.  Abdominal imaging unremarkable.  Suspect symptoms related to CHF.  4.CKD stage IV: Creatinine 3.88 => 4.28 => 4.98 with diuresis. -  Off Lasix .  - Stopping Bidil  and Jardiance  as above.  - Continue milrinone  0.25 today to maintain CO while we allow renal function to stabilize.   5. OSA: CPAP 6. HTN: Avoid hypotension.  7. DVT: Patient has a DVT on the left.  He was supposed to be on apixaban  at home but had not refilled since November, was probably not taking.  - He has been restarted on apixaban  in the hospital.     Length of Stay: 3  Peder Bourdon, MD  06/25/2023, 12:26 PM  Advanced Heart Failure Team Pager [phone] (M-F; 7a - 5p)  Please contact [facility] for night-coverage after hours (5p -7a ) and weekends on [email or web address]

## 2023-06-25 NOTE — Progress Notes (Signed)
 [facility]  ROUNDING NOTE   Subjective:   Hector [name]  is a 58 y.o. male with past medical conditions including hypertension, paroxysmal A-fib on Eliquis , CHF with a EF less than 20%, and chronic kidney disease stage IV.  Patient presents to the emergency department complaining of shortness of breath and swelling and has been admitted for Generalized abdominal pain [R10.84] Hypertensive urgency [I16.0] Calculus of gallbladder without cholecystitis without obstruction [K80.20] Urinary tract infection without hematuria, site unspecified [N39.0] Intractable vomiting with nausea [R11.2] Acute on chronic congestive heart failure, unspecified heart failure type (HCC) [I50.9] Nausea and vomiting, unspecified vomiting type [R11.2]  Patient is known to our practice from previous admissions and is currently followed by [facility] nephrology.  Patient states he began having shortness of breath about a week prior to presentation.  States he has been compliant with all medications including diuretics.  Update Patient laying in bed Milrinone  drip in place  Creatinine 4.98 from 4.28 Urine output 1975 mL in past 24 hours.  Objective:  Vital signs in last 24 hours:  Temp:  [97.9 F (36.6 C)-98.6 F (37 C)] 97.9 F (36.6 C) (05/02 1233) Pulse Rate:  [58-106] 104 (05/02 1233) Resp:  [16-20] 16 (05/02 1233) BP: (89-148)/(50-90) 148/90 (05/02 1233) SpO2:  [69 %-94 %] 69 % (05/02 1233)  Weight change:  Filed Weights   06/21/23 0238 06/24/23 0610  Weight: 121.1 kg 128.1 kg    Intake/Output: I/O last 3 completed shifts: In: 455.1 [P.O.:240; I.V.:215.1] Out: 3575 [Urine:3575]   Intake/Output this shift:  Total I/O In: 118.5 [I.V.:118.5] Out: 350 [Urine:350]  Physical Exam: General: NAD  Head: Normocephalic, atraumatic. Moist oral mucosal membranes  Eyes: Anicteric  Lungs:  Clear to auscultation, normal effort  Heart: Regular rate and rhythm  Abdomen:  Soft, nontender,    Extremities: No peripheral edema.  Neurologic: Alert and oriented, moving all four extremities  Skin: No lesions  Access: None    Basic Metabolic Panel: Recent Labs  Lab 06/21/23 0250 06/22/23 0431 06/23/23 0408 06/23/23 1302 06/23/23 1548 06/24/23 0444 06/25/23 0520  NA 141 135 136 137  136  --  133* 130*  K 3.7 4.4 3.6 4.1  4.2  --  3.5 3.6  CL 103 100 97*  --   --  91* 89*  CO2 25 29 29   --   --  29 28  GLUCOSE 77 108* 99  --   --  147* 129*  BUN 33* 37* 44*  --   --  53* 58*  CREATININE 3.42* 3.82* 3.88*  --  3.93* 4.28* 4.98*  CALCIUM  8.8* 8.5* 8.9  --   --  8.9 8.8*  MG  --   --  2.3  --   --   --  2.3  PHOS  --   --   --   --   --   --  4.9*    Liver Function Tests: Recent Labs  Lab 06/21/23 0250 06/22/23 0431 06/25/23 0520  AST 19 18 21   ALT 14 14 13   ALKPHOS 96 90 103  BILITOT 1.9* 1.2 1.7*  PROT 6.9 7.0 7.5  ALBUMIN 3.5 3.4* 3.7   Recent Labs  Lab 06/21/23 0250  LIPASE 27   No results for input(s): "AMMONIA" in the last 168 hours.  CBC: Recent Labs  Lab 06/21/23 0250 06/22/23 0431 06/23/23 1302 06/23/23 1548 06/25/23 0520  WBC 7.8 7.7  --  7.3 8.8  NEUTROABS 5.6  --   --   --  6.3  HGB 13.2 12.9* 15.0  15.3 14.7 14.4  HCT 41.5 41.5 44.0  45.0 45.4 43.7  MCV 86.1 85.7  --  83.2 82.6  PLT 238 250  --  251 273    Cardiac Enzymes: No results for input(s): "CKTOTAL", "CKMB", "CKMBINDEX", "TROPONINI" in the last 168 hours.  BNP: Invalid input(s): "POCBNP"  CBG: No results for input(s): "GLUCAP" in the last 168 hours.  Microbiology: Results for orders placed or performed during the hospital encounter of 06/21/23  Urine Culture     Status: None   Collection Time: 06/21/23  5:29 AM   Specimen: Urine, Clean Catch  Result Value Ref Range Status   Specimen Description   Final    URINE, CLEAN CATCH Performed at [facility], [address], [place], [place] [address]    Special Requests   Final    NONE Performed at  [facility], [address] [place], [place] [address]    Culture   Final    NO GROWTH Performed at [facility], [address], [place], [place] [address]    Report Status 06/22/2023 FINAL  Final    Coagulation Studies: No results for input(s): "LABPROT", "INR" in the last 72 hours.  Urinalysis: No results for input(s): "COLORURINE", "LABSPEC", "PHURINE", "GLUCOSEU", "HGBUR", "BILIRUBINUR", "KETONESUR", "PROTEINUR", "UROBILINOGEN", "NITRITE", "LEUKOCYTESUR" in the last 72 hours.  Invalid input(s): "APPERANCEUR"    Imaging: US  Venous Img Lower Bilateral (DVT) Result Date: 06/24/2023 CLINICAL DATA:  Bilateral leg edema EXAM: BILATERAL LOWER EXTREMITY VENOUS DOPPLER ULTRASOUND TECHNIQUE: Gray-scale sonography with compression, as well as color and duplex ultrasound, were performed to evaluate the deep venous system(s) from the level of the common femoral vein through the popliteal and proximal calf veins. COMPARISON:  None Available. FINDINGS: VENOUS Nonocclusive thrombus noted in the left mid to distal femoral vein and left popliteal vein. Patent left common femoral vein and profundus femoral vein and patent left calf veins. No right lower extremity DVT is demonstrated. OTHER None. Limitations: none IMPRESSION: 1. Nonocclusive thrombus in the LEFT mid and distal femoral vein and left popliteal vein compatible with acute DVT. 2. No findings of RIGHT lower extremity DVT. Electronically Signed   By: Freida Jes M.D.   On: 06/24/2023 17:40   US  EKG SITE RITE Result Date: 06/23/2023 If Site Rite image not attached, placement could not be confirmed due to current cardiac rhythm.  CARDIAC CATHETERIZATION Result Date: 06/23/2023 1. PCWP remains elevated. 2. Mixed pulmonary venous/pulmonary arterial HTN (?OSA playing a role). 3. Low cardiac output I will initiate milrinone  0.25 mcg/kg/min     Medications:    milrinone  0.25 mcg/kg/min (06/25/23 0739)    amiodarone   200  mg Oral Daily   apixaban   5 mg Oral BID   atorvastatin   80 mg Oral Daily   Chlorhexidine  Gluconate Cloth  6 each Topical Daily   empagliflozin   10 mg Oral Daily   influenza vac split trivalent PF  0.5 mL Intramuscular Tomorrow-1000   levothyroxine   125 mcg Oral QAC breakfast   lipase/protease/amylase  36,000 Units Oral TID AC   sodium chloride  flush  10-40 mL Intracatheter Q12H   sodium chloride  flush  3 mL Intravenous Q12H   sodium chloride  flush  3 mL Intravenous Q12H   acetaminophen , morphine  injection, ondansetron  **OR** ondansetron  (ZOFRAN ) IV, ondansetron  (ZOFRAN ) IV, sodium chloride  flush, sodium chloride  flush, sodium chloride  flush  Assessment/ Plan:  [name] is a 58 y.o.  male  with  past medical conditions including hypertension, paroxysmal A-fib on Eliquis , CHF with a EF less than 20%, and chronic kidney disease stage IV.  Patient presents to the emergency department complaining of shortness of breath and swelling and has been admitted for Generalized abdominal pain [R10.84] Hypertensive urgency [I16.0] Calculus of gallbladder without cholecystitis without obstruction [K80.20] Urinary tract infection without hematuria, site unspecified [N39.0] Intractable vomiting with nausea [R11.2] Acute on chronic congestive heart failure, unspecified heart failure type (HCC) [I50.9] Nausea and vomiting, unspecified vomiting type [R11.2]   Acute Kidney Injury on chronic kidney disease stage 4 with baseline creatinine 3.35 and GFR of 20 on 05/06/2023.  Acute kidney injury secondary to hemodynamic instability and suspected cardiorenal syndrome Patient did undergo right heart cath however contrast is usually not utilized for this.    Creatinine continues to worsen.  Adequate urine output noted.  No immediate need for dialysis however discussed with patient that if renal function continues to deteriorate we may have to revisit renal replacement therapy.  Continuing to monitor renal  indices at this time.  Lab Results  Component Value Date   CREATININE 4.98 (H) 06/25/2023   CREATININE 4.28 (H) 06/24/2023   CREATININE 3.93 (H) 06/23/2023    Intake/Output Summary (Last 24 hours) at 06/25/2023 1236 Last data filed at 06/25/2023 0800 Gross per 24 hour  Intake 203.81 ml  Output 1725 ml  Net -1521.19 ml    2.  Chronic systolic heart failure.  TEE echo completed on 01/19/2023 shows EF less than 20%.  Cardiology following and performed right heart cath on 4/30 which showed mixed pulmonary venous/arterial hypertension with low cardiac output and PCWP elevation.  Remains on milrinone  drip.  3. Anemia of chronic kidney disease Lab Results  Component Value Date   HGB 14.4 06/25/2023    Hemoglobin at desired goal   LOS: 3 Sofiah Lyne 5/2/202512:36 PM

## 2023-06-25 NOTE — Progress Notes (Signed)
 PROGRESS NOTE    Hector [name]  PIR:[id number] DOB: [birth year] DOA: 06/21/2023 PCP: Dionicia Frater, MD  No chief complaint on file.   Hospital Course:  Hector [name] is a 58 year old male with heart failure with reduced EF less than 20%, nonischemic cardiomyopathy, history of alcohol use disorder sober for 13 years, paroxysmal A-fib/flutter on Eliquis , OSA on CPAP, CKD stage IV, CVA, hypertension, hyperlipidemia, type 2 diabetes, presented to the hospital with abdominal pain, nausea, vomiting and increased lower extremity swelling with progressive dyspnea.  He was admitted for acute on chronic CHF exacerbation.  Subjective: No acute events overnight.  Patient has no complaints this morning.  His lower extremity edema is beginning to resolve.  Kidney function is worsening overnight.  He does appear dyspneic when speaking  Objective: Vitals:   06/24/23 2200 06/25/23 0001 06/25/23 0337 06/25/23 0834  BP: (!) 89/75 104/68 110/71 (!) 120/50  Pulse:  (!) 101 (!) 58 (!) 101  Resp:  20 16 20   Temp:  98.2 F (36.8 C) 98.2 F (36.8 C)   TempSrc:   Oral   SpO2:  90% (!) 89% 94%  Weight:      Height:        Intake/Output Summary (Last 24 hours) at 06/25/2023 0959 Last data filed at 06/25/2023 0800 Gross per 24 hour  Intake 443.81 ml  Output 2025 ml  Net -1581.19 ml   Filed Weights   06/21/23 0238 06/24/23 0610  Weight: 121.1 kg 128.1 kg    Examination: General exam: Appears calm and comfortable, NAD  Respiratory system: No work of breathing, symmetric chest wall expansion Cardiovascular system: S1 & S2 heard, RRR.  Gastrointestinal system: Abdomen is nondistended, soft and nontender.  Neuro: Alert and oriented. No focal neurological deficits. Extremities: 1+ pitting edema right leg Skin: No rashes, lesions Psychiatry: Demonstrates appropriate judgement and insight. Mood & affect appropriate for situation.   Assessment & Plan:  Principal Problem:   Acute on  chronic HFrEF (heart failure with reduced ejection fraction) (HCC) Active Problems:   Nausea and vomiting   Chronic renal impairment, stage 4 (severe) (HCC)   OSA (obstructive sleep apnea)   Type II diabetes mellitus with renal manifestations (HCC)   Hyperbilirubinemia   Essential hypertension   Alcohol use, unspecified, in remission   Gout   Paroxysmal atrial flutter (HCC)   Alcoholic cirrhosis of liver without ascites (HCC)   Intractable vomiting with nausea   NSTEMI (non-ST elevated myocardial infarction) (HCC)   Acute on chronic systolic CHF Nonischemic cardiomyopathy - BNP 756 on arrival - TEE November 2024: EF less than 20% - RHC 4/30: PCWP elevated, mixed pulmonary venous/pulmonary arterial hypertension.  Low cardiac output.  Has been initiated on milrinone  per cardiology.  Titration per cardiology based on Co-ox - Home meds include high-dose Lasix , isosorbide , hydralazine , Jardiance  - Further Cardiology recommendations appreciated. - GDMT limited due to low blood pressures and worsening kidney function.  BiDil  held, Jardiance  held.  Unable to start beta-blocker due to low output heart failure. - Reportedly he is not a candidate for ICD given poor compliance with medications and CKD stage IV  Left lower extremity DVT - Initiated loading dose Eliquis .  Will need indefinite Eliquis  at discharge.  He was meant to be on this medication for A-fib but appears last time he picked up medication was November 2024  Elevated troponins - Likely secondary to chronic myocardial injury, has been chronically elevated in the past - No STEMI - Cardiology following as  above  Hypoxia - Secondary to volume overload. - Diuresis as kidney function can tolerate - Continue to wean oxygen as tolerated  Nausea, vomiting, abdominal pain - Resolved. - Thought to be secondary to decompensated heart failure  Paroxysmal A-fib - DCCV November 2024 - NSR for now - Continue amiodarone  and  Eliquis   CKD stage IV - Creatinine baseline near 3.4-3.8. - Right and continues to worsen, likely secondary to cardiorenal syndrome. - Nephrology has been consulted - Continue to follow strict I's and O's.  Patient maintains good urine output, but may require hemodialysis, especially in setting of needed diuresis - Appreciate further nephrology recommendations - Avoid additional nephrotoxic meds if able  Type 2 diabetes - Jardiance  given worsening CKD as above. - Sliding scale insulin , titrate up as needed  History of CVA Hypertension Hyperlipidemia - Continue statin as above  OSA - CPAP at night  Liver cirrhosis Remote history of alcohol use disorder - CMP stable.  BMI 37 Obesity - Outpatient follow up for lifestyle modification and risk factor management  DVT prophylaxis: Loading dose Eliquis    Code Status: Full Code Disposition:  Inpatient. Decompensated heart failure.  On milrinone .  Cardiology following and managing  Consultants:  Treatment Team:  Consulting Physician: Wenona Hamilton, MD Consulting Physician: Lateef, Munsoor, MD  Procedures:  RHC 4/30  Antimicrobials:  Anti-infectives (From admission, onward)    Start     Dose/Rate Route Frequency Ordered Stop   06/21/23 0615  cefTRIAXone  (ROCEPHIN ) 1 g in sodium chloride  0.9 % 100 mL IVPB        1 g 200 mL/hr over 30 Minutes Intravenous  Once 06/21/23 0609 06/21/23 0654       Data Reviewed: I have personally reviewed following labs and imaging studies CBC: Recent Labs  Lab 06/21/23 0250 06/22/23 0431 06/23/23 1302 06/23/23 1548 06/25/23 0520  WBC 7.8 7.7  --  7.3 8.8  NEUTROABS 5.6  --   --   --  6.3  HGB 13.2 12.9* 15.0  15.3 14.7 14.4  HCT 41.5 41.5 44.0  45.0 45.4 43.7  MCV 86.1 85.7  --  83.2 82.6  PLT 238 250  --  251 273   Basic Metabolic Panel: Recent Labs  Lab 06/21/23 0250 06/22/23 0431 06/23/23 0408 06/23/23 1302 06/23/23 1548 06/24/23 0444 06/25/23 0520  NA 141 135  136 137  136  --  133* 130*  K 3.7 4.4 3.6 4.1  4.2  --  3.5 3.6  CL 103 100 97*  --   --  91* 89*  CO2 25 29 29   --   --  29 28  GLUCOSE 77 108* 99  --   --  147* 129*  BUN 33* 37* 44*  --   --  53* 58*  CREATININE 3.42* 3.82* 3.88*  --  3.93* 4.28* 4.98*  CALCIUM  8.8* 8.5* 8.9  --   --  8.9 8.8*  MG  --   --  2.3  --   --   --  2.3  PHOS  --   --   --   --   --   --  4.9*   GFR: Estimated Creatinine Clearance: 22 mL/min (A) (by C-G formula based on SCr of 4.98 mg/dL (H)). Liver Function Tests: Recent Labs  Lab 06/21/23 0250 06/22/23 0431 06/25/23 0520  AST 19 18 21   ALT 14 14 13   ALKPHOS 96 90 103  BILITOT 1.9* 1.2 1.7*  PROT 6.9 7.0 7.5  ALBUMIN  3.5 3.4* 3.7   CBG: No results for input(s): "GLUCAP" in the last 168 hours.  Recent Results (from the past 240 hours)  Urine Culture     Status: None   Collection Time: 06/21/23  5:29 AM   Specimen: Urine, Clean Catch  Result Value Ref Range Status   Specimen Description   Final    URINE, CLEAN CATCH Performed at [facility], [address], [place], [place] [address]    Special Requests   Final    NONE Performed at [facility], [address] [place], [place] [address]    Culture   Final    NO GROWTH Performed at [facility], [address] [place], [place] [address]    Report Status 06/22/2023 FINAL  Final     Radiology Studies: US  Venous Img Lower Bilateral (DVT) Result Date: 06/24/2023 CLINICAL DATA:  Bilateral leg edema EXAM: BILATERAL LOWER EXTREMITY VENOUS DOPPLER ULTRASOUND TECHNIQUE: Gray-scale sonography with compression, as well as color and duplex ultrasound, were performed to evaluate the deep venous system(s) from the level of the common femoral vein through the popliteal and proximal calf veins. COMPARISON:  None Available. FINDINGS: VENOUS Nonocclusive thrombus noted in the left mid to distal femoral vein and left popliteal vein. Patent left common femoral vein and  profundus femoral vein and patent left calf veins. No right lower extremity DVT is demonstrated. OTHER None. Limitations: none IMPRESSION: 1. Nonocclusive thrombus in the LEFT mid and distal femoral vein and left popliteal vein compatible with acute DVT. 2. No findings of RIGHT lower extremity DVT. Electronically Signed   By: Freida Jes M.D.   On: 06/24/2023 17:40   US  EKG SITE RITE Result Date: 06/23/2023 If Site Rite image not attached, placement could not be confirmed due to current cardiac rhythm.  CARDIAC CATHETERIZATION Result Date: 06/23/2023 1. PCWP remains elevated. 2. Mixed pulmonary venous/pulmonary arterial HTN (?OSA playing a role). 3. Low cardiac output I will initiate milrinone  0.25 mcg/kg/min    Scheduled Meds:  amiodarone   200 mg Oral Daily   apixaban   5 mg Oral BID   atorvastatin   80 mg Oral Daily   Chlorhexidine  Gluconate Cloth  6 each Topical Daily   empagliflozin   10 mg Oral Daily   influenza vac split trivalent PF  0.5 mL Intramuscular Tomorrow-1000   levothyroxine   125 mcg Oral QAC breakfast   lipase/protease/amylase  36,000 Units Oral TID AC   sodium chloride  flush  10-40 mL Intracatheter Q12H   sodium chloride  flush  3 mL Intravenous Q12H   sodium chloride  flush  3 mL Intravenous Q12H   Continuous Infusions:  milrinone  0.25 mcg/kg/min (06/25/23 0739)     LOS: 3 days  MDM: Patient is high risk for one or more organ failure.  They necessitate ongoing hospitalization for continued IV therapies and subsequent lab monitoring. Total time spent interpreting labs and vitals, reviewing the medical record, coordinating care amongst consultants and care team members, directly assessing and discussing care with the patient and/or family: 55 min  Michaella Imai, DO [facility]  To contact the attending physician between 7A-7P please use Epic Chat. To contact the covering physician during after hours 7P-7A, please review Amion.  06/25/2023, 9:59 AM   *This  document has been created with the assistance of dictation software. Please excuse typographical errors. *

## 2023-06-26 DIAGNOSIS — I130 Hypertensive heart and chronic kidney disease with heart failure and stage 1 through stage 4 chronic kidney disease, or unspecified chronic kidney disease: Secondary | ICD-10-CM

## 2023-06-26 DIAGNOSIS — R570 Cardiogenic shock: Secondary | ICD-10-CM | POA: Diagnosis not present

## 2023-06-26 DIAGNOSIS — I5023 Acute on chronic systolic (congestive) heart failure: Secondary | ICD-10-CM | POA: Diagnosis not present

## 2023-06-26 DIAGNOSIS — N184 Chronic kidney disease, stage 4 (severe): Secondary | ICD-10-CM | POA: Diagnosis not present

## 2023-06-26 LAB — COMPREHENSIVE METABOLIC PANEL WITH GFR
ALT: 13 U/L (ref 0–44)
AST: 21 U/L (ref 15–41)
Albumin: 3.5 g/dL (ref 3.5–5.0)
Alkaline Phosphatase: 94 U/L (ref 38–126)
Anion gap: 15 (ref 5–15)
BUN: 64 mg/dL — ABNORMAL HIGH (ref 6–20)
CO2: 27 mmol/L (ref 22–32)
Calcium: 8.4 mg/dL — ABNORMAL LOW (ref 8.9–10.3)
Chloride: 88 mmol/L — ABNORMAL LOW (ref 98–111)
Creatinine, Ser: 4.83 mg/dL — ABNORMAL HIGH (ref 0.61–1.24)
GFR, Estimated: 13 mL/min — ABNORMAL LOW (ref >60)
Glucose, Bld: 186 mg/dL — ABNORMAL HIGH (ref 70–99)
Potassium: 3.2 mmol/L — ABNORMAL LOW (ref 3.5–5.1)
Sodium: 130 mmol/L — ABNORMAL LOW (ref 135–145)
Total Bilirubin: 1.3 mg/dL — ABNORMAL HIGH (ref 0.0–1.2)
Total Protein: 6.9 g/dL (ref 6.5–8.1)

## 2023-06-26 LAB — CBC WITH DIFFERENTIAL/PLATELET
Abs Immature Granulocytes: 0.04 10*3/uL (ref 0.00–0.07)
Basophils Absolute: 0 10*3/uL (ref 0.0–0.1)
Basophils Relative: 1 %
Eosinophils Absolute: 0.3 10*3/uL (ref 0.0–0.5)
Eosinophils Relative: 4 %
HCT: 41 % (ref 39.0–52.0)
Hemoglobin: 13.5 g/dL (ref 13.0–17.0)
Immature Granulocytes: 1 %
Lymphocytes Relative: 15 %
Lymphs Abs: 1.2 10*3/uL (ref 0.7–4.0)
MCH: 27 pg (ref 26.0–34.0)
MCHC: 32.9 g/dL (ref 30.0–36.0)
MCV: 82 fL (ref 80.0–100.0)
Monocytes Absolute: 0.9 10*3/uL (ref 0.1–1.0)
Monocytes Relative: 11 %
Neutro Abs: 5.8 10*3/uL (ref 1.7–7.7)
Neutrophils Relative %: 68 %
Platelets: 257 10*3/uL (ref 150–400)
RBC: 5 MIL/uL (ref 4.22–5.81)
RDW: 16.2 % — ABNORMAL HIGH (ref 11.5–15.5)
WBC: 8.3 10*3/uL (ref 4.0–10.5)
nRBC: 0 % (ref 0.0–0.2)

## 2023-06-26 LAB — MAGNESIUM: Magnesium: 2.4 mg/dL (ref 1.7–2.4)

## 2023-06-26 LAB — GLUCOSE, CAPILLARY: Glucose-Capillary: 197 mg/dL — ABNORMAL HIGH (ref 70–99)

## 2023-06-26 LAB — PHOSPHORUS: Phosphorus: 4.6 mg/dL (ref 2.5–4.6)

## 2023-06-26 MED ORDER — LACTULOSE 10 GM/15ML PO SOLN
10.0000 g | Freq: Two times a day (BID) | ORAL | Status: DC
  Filled 2023-06-26 (×6): qty 30

## 2023-06-26 MED ORDER — POTASSIUM CHLORIDE CRYS ER 20 MEQ PO TBCR
40.0000 meq | EXTENDED_RELEASE_TABLET | Freq: Once | ORAL | Status: AC
  Administered 2023-06-26: 40 meq via ORAL
  Filled 2023-06-26: qty 2

## 2023-06-26 MED ORDER — AMIODARONE IV BOLUS ONLY 150 MG/100ML
150.0000 mg | Freq: Once | INTRAVENOUS | Status: AC
  Administered 2023-06-26: 150 mg via INTRAVENOUS
  Filled 2023-06-26 (×2): qty 100

## 2023-06-26 MED ORDER — AMIODARONE HCL IN DEXTROSE 360-4.14 MG/200ML-% IV SOLN
60.0000 mg/h | INTRAVENOUS | Status: AC
  Administered 2023-06-26 (×2): 60 mg/h via INTRAVENOUS
  Filled 2023-06-26: qty 200

## 2023-06-26 MED ORDER — AMIODARONE HCL IN DEXTROSE 360-4.14 MG/200ML-% IV SOLN
30.0000 mg/h | INTRAVENOUS | Status: DC
  Administered 2023-06-27: 30 mg/h via INTRAVENOUS
  Filled 2023-06-26 (×2): qty 200

## 2023-06-26 MED ORDER — INSULIN ASPART 100 UNIT/ML IJ SOLN
0.0000 [IU] | Freq: Three times a day (TID) | INTRAMUSCULAR | Status: DC
  Filled 2023-06-26 (×4): qty 1

## 2023-06-26 NOTE — Progress Notes (Signed)
   06/26/23 1511  Assess: MEWS Score  Temp 98.4 F (36.9 C)  BP 93/70  MAP (mmHg) 78  Pulse Rate 71  ECG Heart Rate (!) 146  Resp 19  Level of Consciousness Alert  SpO2 91 %  O2 Device Room Air  Assess: MEWS Score  MEWS Temp 0  MEWS Systolic 1  MEWS Pulse 3  MEWS RR 0  MEWS LOC 0  MEWS Score 4  MEWS Score Color Red  Assess: if the MEWS score is Yellow or Red  Were vital signs accurate and taken at a resting state? Yes  Does the patient meet 2 or more of the SIRS criteria? No  Does the patient have a confirmed or suspected source of infection? No  MEWS guidelines implemented  Yes, red  Treat  MEWS Interventions Considered administering scheduled or prn medications/treatments as ordered  Take Vital Signs  Increase Vital Sign Frequency  Red: Q1hr x2, continue Q4hrs until patient remains green for 12hrs  Escalate  MEWS: Escalate Red: Discuss with charge nurse and notify provider. Consider notifying RRT. If remains red for 2 hours consider need for higher level of care  Notify: Charge Nurse/RN  Name of Charge Nurse/RN Notified Shayne Demark, RN  Provider Notification  Provider Name/Title Dr. Charleston Conrad  Date Provider Notified 06/26/23  Time Provider Notified 1525  Method of Notification Page  Notification Reason Other (Comment) (Red MEWS. HR 130-150s)  Provider response Evaluate remotely (Dr. Veryl Gottron responded)  Date of Provider Response 06/26/23  Time of Provider Response 1527  Assess: SIRS CRITERIA  SIRS Temperature  0  SIRS Respirations  0  SIRS Pulse 1  SIRS WBC 0  SIRS Score Sum  1   Notified Dr. Veryl Gottron that patient's heart rate up in 130s appeared to be sinus tach on monitor while asleep. When awake heart rate up to 155. EKG performed and in epic. Patient denies chest pain, shortness of breath and dizziness. MD acknowledged. No orders given at this time. Page also sent to Dr. Marquette Sites, waiting for response.

## 2023-06-26 NOTE — Progress Notes (Signed)
 Notified Dr. Dezii that patient refused lactulose. MD acknowledged.

## 2023-06-26 NOTE — Progress Notes (Signed)
 Rounding Note    Patient Name: Hector [name] Date of Encounter: 06/26/2023  [name] Cardiologist: Belva Boyden, MD   Subjective   No acute events overnight. Resting comfortably in bed. Inpatient Medications    Scheduled Meds:  amiodarone   200 mg Oral Daily   apixaban   10 mg Oral BID   Followed by   Cecily Cohen ON 07/02/2023] apixaban   5 mg Oral BID   atorvastatin   80 mg Oral Daily   Chlorhexidine  Gluconate Cloth  6 each Topical Daily   influenza vac split trivalent PF  0.5 mL Intramuscular Tomorrow-1000   lactulose  10 g Oral BID   levothyroxine   125 mcg Oral QAC breakfast   lipase/protease/amylase  36,000 Units Oral TID AC   potassium chloride   40 mEq Oral Once   sodium chloride  flush  10-40 mL Intracatheter Q12H   sodium chloride  flush  3 mL Intravenous Q12H   sodium chloride  flush  3 mL Intravenous Q12H   Continuous Infusions:  milrinone  0.25 mcg/kg/min (06/26/23 1501)   PRN Meds: acetaminophen , morphine  injection, ondansetron  **OR** ondansetron  (ZOFRAN ) IV, ondansetron  (ZOFRAN ) IV, sodium chloride  flush, sodium chloride  flush, sodium chloride  flush   Vital Signs    Vitals:   06/26/23 0420 06/26/23 0810 06/26/23 0849 06/26/23 1220  BP: 104/75  124/76 106/66  Pulse: 94 100  98  Resp: 16 16    Temp: (!) 97 F (36.1 C) 98 F (36.7 C)  98.5 F (36.9 C)  TempSrc:  Oral  Oral  SpO2: 91% 95%  95%  Weight: 121.4 kg     Height:        Intake/Output Summary (Last 24 hours) at 06/26/2023 1501 Last data filed at 06/26/2023 1410 Gross per 24 hour  Intake 1238.5 ml  Output 1200 ml  Net 38.5 ml      06/26/2023    4:20 AM 06/24/2023    6:10 AM 06/21/2023    2:38 AM  Last 3 Weights  Weight (lbs) 267 lb 10.2 oz 282 lb 8 oz 267 lb  Weight (kg) 121.4 kg 128.141 kg 121.11 kg      Telemetry    NSR - Personally Reviewed  Physical Exam   GEN: No acute distress.   Neck: difficult to visualize JVD 2/2 body habitus Cardiac: RRR, no murmurs, rubs, or  gallops.  Respiratory: Clear to auscultation bilaterally. GI: Soft, nontender, non-distended  MS: bilateral minimal LE edema; No deformity. Neuro:  Nonfocal  Psych: Normal affect   New pertinent results (labs, ECG, imaging, cardiac studies)    RHC personally reviewed  Assessment & Plan    Acute on chronic systolic and diastolic heart failure Nonischemic cardiomyopathy Cardiorenal syndrome, cardiogenic shock -EF <20% -RHC with PA sat 57%, CI 1.68, elevated wedge, mixed pulm venous/pulm arterial hypertension. Started on milrinone  0.25 mcg/kg/min -coox this AM likely incorrect, O2 sat was 97. Yesterday coox was 70.8, improved from 55 -continues to have urine output, no plans for dialysis at this time -was on bidil , jardiance , torsemide /metolazone  as an outpatient. Holding to allow for higher perfusion pressure -has PICC line but CVP not available. Diuretics currently on hold  Hypokalemia: K 3.2, with renal disease will allow nephrology to manage long term, will given 40 meq PO for today to avoid worsening arrhythmia  Atrial arrhythmia -continue amiodarone   Chronic kidney disease, stage 4, with acute kidney injury this admission History of hypertension -Cr peaked 4.98, down slightly to 4.83 today -nephrology following -scaling back on meds as above to  allow for   History of DVT: restarted on apixaban  this admission  OSA: continue CPAP    Signed, Sheryle Donning, MD  06/26/2023, 3:01 PM

## 2023-06-26 NOTE — Plan of Care (Signed)
  Problem: Education: Goal: Knowledge of General Education information will improve Description: Including pain rating scale, medication(s)/side effects and non-pharmacologic comfort measures Outcome: Progressing   Problem: Health Behavior/Discharge Planning: Goal: Ability to manage health-related needs will improve Outcome: Progressing   Problem: Clinical Measurements: Goal: Ability to maintain clinical measurements within normal limits will improve Outcome: Progressing Goal: Diagnostic test results will improve Outcome: Progressing Goal: Cardiovascular complication will be avoided Outcome: Progressing   Problem: Activity: Goal: Risk for activity intolerance will decrease Outcome: Progressing   Problem: Nutrition: Goal: Adequate nutrition will be maintained Outcome: Progressing   Problem: Elimination: Goal: Will not experience complications related to urinary retention Outcome: Progressing

## 2023-06-26 NOTE — Progress Notes (Signed)
 Dr. Dezii ordered amiodarone  drip.

## 2023-06-26 NOTE — Progress Notes (Signed)
 Notified Dr. Marquette Sites and Dr. Veryl Gottron at [phone] that amio bolus was given and heart rate is 130s and BP 109/66, appears to be sinus tach. Waiting for response.

## 2023-06-26 NOTE — Progress Notes (Signed)
 [facility]  ROUNDING NOTE   Subjective:   Hector [name]  is a 57 y.o. male with past medical conditions including hypertension, paroxysmal A-fib on Eliquis , CHF with a EF less than 20%, and chronic kidney disease stage IV.  Patient presents to the emergency department complaining of shortness of breath and swelling and has been admitted for Generalized abdominal pain [R10.84] Hypertensive urgency [I16.0] Calculus of gallbladder without cholecystitis without obstruction [K80.20] Urinary tract infection without hematuria, site unspecified [N39.0] Intractable vomiting with nausea [R11.2] Acute on chronic congestive heart failure, unspecified heart failure type (HCC) [I50.9] Nausea and vomiting, unspecified vomiting type [R11.2]  Patient is known to our practice from previous admissions and is currently followed by [facility] nephrology.  Patient states he began having shortness of breath about a week prior to presentation.  States he has been compliant with all medications including diuretics.  Update Patient seen sitting at side of bed Just completed breakfast Alert and oriented, room air No lower extremity edema  Milrinone  drip in place  Objective:  Vital signs in last 24 hours:  Temp:  [97 F (36.1 C)-98.5 F (36.9 C)] 98 F (36.7 C) (05/03 0810) Pulse Rate:  [94-104] 100 (05/03 0810) Resp:  [16-20] 16 (05/03 0810) BP: (104-148)/(71-90) 124/76 (05/03 0849) SpO2:  [91 %-95 %] 95 % (05/03 0810) Weight:  [121.4 kg] 121.4 kg (05/03 0420)  Weight change:  Filed Weights   06/21/23 0238 06/24/23 0610 06/26/23 0420  Weight: 121.1 kg 128.1 kg 121.4 kg    Intake/Output: I/O last 3 completed shifts: In: 877 [P.O.:680; I.V.:197] Out: 2325 [Urine:2325]   Intake/Output this shift:  Total I/O In: 360 [P.O.:360] Out: 200 [Urine:200]  Physical Exam: General: NAD  Head: Normocephalic, atraumatic. Moist oral mucosal membranes  Eyes: Anicteric  Lungs:  Clear to  auscultation, normal effort  Heart: Regular rate and rhythm  Abdomen:  Soft, nontender,   Extremities: No peripheral edema.  Neurologic: Alert and oriented, moving all four extremities  Skin: No lesions  Access: None    Basic Metabolic Panel: Recent Labs  Lab 06/22/23 0431 06/23/23 0408 06/23/23 1302 06/23/23 1548 06/24/23 0444 06/25/23 0520 06/26/23 0500  NA 135 136 137  136  --  133* 130* 130*  K 4.4 3.6 4.1  4.2  --  3.5 3.6 3.2*  CL 100 97*  --   --  91* 89* 88*  CO2 29 29  --   --  29 28 27   GLUCOSE 108* 99  --   --  147* 129* 186*  BUN 37* 44*  --   --  53* 58* 64*  CREATININE 3.82* 3.88*  --  3.93* 4.28* 4.98* 4.83*  CALCIUM  8.5* 8.9  --   --  8.9 8.8* 8.4*  MG  --  2.3  --   --   --  2.3 2.4  PHOS  --   --   --   --   --  4.9* 4.6    Liver Function Tests: Recent Labs  Lab 06/21/23 0250 06/22/23 0431 06/25/23 0520 06/26/23 0500  AST 19 18 21 21   ALT 14 14 13 13   ALKPHOS 96 90 103 94  BILITOT 1.9* 1.2 1.7* 1.3*  PROT 6.9 7.0 7.5 6.9  ALBUMIN 3.5 3.4* 3.7 3.5   Recent Labs  Lab 06/21/23 0250  LIPASE 27   No results for input(s): "AMMONIA" in the last 168 hours.  CBC: Recent Labs  Lab 06/21/23 0250 06/22/23 0431 06/23/23 1302 06/23/23  1548 06/25/23 0520 06/26/23 0500  WBC 7.8 7.7  --  7.3 8.8 8.3  NEUTROABS 5.6  --   --   --  6.3 5.8  HGB 13.2 12.9* 15.0  15.3 14.7 14.4 13.5  HCT 41.5 41.5 44.0  45.0 45.4 43.7 41.0  MCV 86.1 85.7  --  83.2 82.6 82.0  PLT 238 250  --  251 273 257    Cardiac Enzymes: No results for input(s): "CKTOTAL", "CKMB", "CKMBINDEX", "TROPONINI" in the last 168 hours.  BNP: Invalid input(s): "POCBNP"  CBG: No results for input(s): "GLUCAP" in the last 168 hours.  Microbiology: Results for orders placed or performed during the hospital encounter of 06/21/23  Urine Culture     Status: None   Collection Time: 06/21/23  5:29 AM   Specimen: Urine, Clean Catch  Result Value Ref Range Status   Specimen  Description   Final    URINE, CLEAN CATCH Performed at [facility], [address] [place], [place] [address]    Special Requests   Final    NONE Performed at [facility], [address] [place], [place] [address]    Culture   Final    NO GROWTH Performed at [facility], [address] [place], [place] [address]    Report Status 06/22/2023 FINAL  Final    Coagulation Studies: No results for input(s): "LABPROT", "INR" in the last 72 hours.  Urinalysis: No results for input(s): "COLORURINE", "LABSPEC", "PHURINE", "GLUCOSEU", "HGBUR", "BILIRUBINUR", "KETONESUR", "PROTEINUR", "UROBILINOGEN", "NITRITE", "LEUKOCYTESUR" in the last 72 hours.  Invalid input(s): "APPERANCEUR"    Imaging: US  Venous Img Lower Bilateral (DVT) Result Date: 06/24/2023 CLINICAL DATA:  Bilateral leg edema EXAM: BILATERAL LOWER EXTREMITY VENOUS DOPPLER ULTRASOUND TECHNIQUE: Gray-scale sonography with compression, as well as color and duplex ultrasound, were performed to evaluate the deep venous system(s) from the level of the common femoral vein through the popliteal and proximal calf veins. COMPARISON:  None Available. FINDINGS: VENOUS Nonocclusive thrombus noted in the left mid to distal femoral vein and left popliteal vein. Patent left common femoral vein and profundus femoral vein and patent left calf veins. No right lower extremity DVT is demonstrated. OTHER None. Limitations: none IMPRESSION: 1. Nonocclusive thrombus in the LEFT mid and distal femoral vein and left popliteal vein compatible with acute DVT. 2. No findings of RIGHT lower extremity DVT. Electronically Signed   By: Freida Jes M.D.   On: 06/24/2023 17:40     Medications:    milrinone  0.25 mcg/kg/min (06/26/23 0556)    amiodarone   200 mg Oral Daily   apixaban   10 mg Oral BID   Followed by   Cecily Cohen ON 07/02/2023] apixaban   5 mg Oral BID   atorvastatin   80 mg Oral Daily   Chlorhexidine  Gluconate Cloth  6 each  Topical Daily   influenza vac split trivalent PF  0.5 mL Intramuscular Tomorrow-1000   levothyroxine   125 mcg Oral QAC breakfast   lipase/protease/amylase  36,000 Units Oral TID AC   sodium chloride  flush  10-40 mL Intracatheter Q12H   sodium chloride  flush  3 mL Intravenous Q12H   sodium chloride  flush  3 mL Intravenous Q12H   acetaminophen , morphine  injection, ondansetron  **OR** ondansetron  (ZOFRAN ) IV, ondansetron  (ZOFRAN ) IV, sodium chloride  flush, sodium chloride  flush, sodium chloride  flush  Assessment/ Plan:  [name] is a 58 y.o.  male  with past medical conditions including hypertension, paroxysmal A-fib on Eliquis , CHF with a EF less than 20%,  and chronic kidney disease stage IV.  Patient presents to the emergency department complaining of shortness of breath and swelling and has been admitted for Generalized abdominal pain [R10.84] Hypertensive urgency [I16.0] Calculus of gallbladder without cholecystitis without obstruction [K80.20] Urinary tract infection without hematuria, site unspecified [N39.0] Intractable vomiting with nausea [R11.2] Acute on chronic congestive heart failure, unspecified heart failure type (HCC) [I50.9] Nausea and vomiting, unspecified vomiting type [R11.2]   Acute Kidney Injury on chronic kidney disease stage 4 with baseline creatinine 3.35 and GFR of 20 on 05/06/2023.  Follows with [facility] nephrology. Acute kidney injury secondary to hemodynamic instability and suspected cardiorenal syndrome Patient did undergo right heart cath however contrast is usually not utilized for this.    Creatinine minimally improved today, continues to have adequate urine output, 1.35 L.  No acute indication for dialysis at this time.  Will continue to monitor closely.  Lab Results  Component Value Date   CREATININE 4.83 (H) 06/26/2023   CREATININE 4.98 (H) 06/25/2023   CREATININE 4.28 (H) 06/24/2023    Intake/Output Summary (Last 24 hours) at 06/26/2023  1200 Last data filed at 06/26/2023 1036 Gross per 24 hour  Intake 1118.5 ml  Output 1200 ml  Net -81.5 ml    2.  Chronic systolic heart failure.  TEE echo completed on 01/19/2023 shows EF less than 20%.  Cardiology following and performed right heart cath on 4/30 which showed mixed pulmonary venous/arterial hypertension with low cardiac output and PCWP elevation.  Remains on milrinone  drip.  3. Anemia of chronic kidney disease Lab Results  Component Value Date   HGB 13.5 06/26/2023    Hemoglobin remains at goal   LOS: 4 Tekeyah Santiago 5/3/202512:00 PM

## 2023-06-26 NOTE — Progress Notes (Signed)
 Notified Dr. Veryl Gottron that around 1530 patient's heart rate remaining in 130s. Dr. Marquette Sites also in secure chat with this message and message from this RN was seen. Dr. Veryl Gottron responded to page at 1627.  MD stated she would order amio bolus.

## 2023-06-26 NOTE — Progress Notes (Signed)
 PROGRESS NOTE    Hector [name]  ZHY:[id number] DOB: Oct 27, 1965 DOA: 06/21/2023 PCP: Dionicia Frater, MD  No chief complaint on file.   Hospital Course:  Hector [name] is a 58 year old male with heart failure with reduced EF less than 20%, nonischemic cardiomyopathy, history of alcohol use disorder sober for 13 years, paroxysmal A-fib/flutter on Eliquis , OSA on CPAP, CKD stage IV, CVA, hypertension, hyperlipidemia, type 2 diabetes, presented to the hospital with abdominal pain, nausea, vomiting and increased lower extremity swelling with progressive dyspnea.  He was admitted for acute on chronic CHF exacerbation.  Subjective: Patient has not had a bowel movement in many days.  He is refusing bowel regimen. Denies any chest pain or issues today.  Later in the afternoon patient began experiencing atypical flutter with variable rate.  Neurology notified and has initiated IV Amio  Objective: Vitals:   06/26/23 0026 06/26/23 0420 06/26/23 0810 06/26/23 0849  BP: 108/74 104/75  124/76  Pulse: 94 94 100   Resp: 18 16 16    Temp: 98 F (36.7 C) (!) 97 F (36.1 C) 98 F (36.7 C)   TempSrc:   Oral   SpO2: 91% 91% 95%   Weight:  121.4 kg    Height:        Intake/Output Summary (Last 24 hours) at 06/26/2023 0922 Last data filed at 06/26/2023 0856 Gross per 24 hour  Intake 998.5 ml  Output 1200 ml  Net -201.5 ml   Filed Weights   06/21/23 0238 06/24/23 0610 06/26/23 0420  Weight: 121.1 kg 128.1 kg 121.4 kg    Examination: General exam: Appears calm and comfortable, NAD obese Respiratory system: No work of breathing, symmetric chest wall expansion Cardiovascular system: S1 & S2 heard, RRR.  Gastrointestinal system: Abdomen is nondistended, soft and nontender.  Neuro: Alert and oriented. No focal neurological deficits. Extremities: No lower extremity edema today Skin: No rashes, lesions Psychiatry: Demonstrates appropriate judgement and insight. Mood & affect  appropriate for situation.   Assessment & Plan:  Principal Problem:   Acute on chronic HFrEF (heart failure with reduced ejection fraction) (HCC) Active Problems:   Nausea and vomiting   Chronic renal impairment, stage 4 (severe) (HCC)   OSA (obstructive sleep apnea)   Type II diabetes mellitus with renal manifestations (HCC)   Hyperbilirubinemia   Essential hypertension   Alcohol use, unspecified, in remission   Gout   Paroxysmal atrial flutter (HCC)   Alcoholic cirrhosis of liver without ascites (HCC)   Intractable vomiting with nausea   NSTEMI (non-ST elevated myocardial infarction) (HCC)   Acute on chronic systolic CHF Nonischemic cardiomyopathy - BNP 756 on arrival - TEE November 2024: EF less than 20% - RHC 4/30: PCWP elevated, mixed pulmonary venous/pulmonary arterial hypertension.  Low cardiac output. - Currently on milrinone , titration based on Co-ox per cardiology - Now on Amio for atrial arrhythmias.  Appreciate further cardiology recommendations - Home meds include high-dose Lasix , isosorbide , hydralazine , Jardiance .  Worsening kidney function many of these medications have been held.  Diuresis per nephro cardiology. - GDMT limited due to low blood pressures and worsening kidney function.  BiDil  held, Jardiance  held.  Unable to start beta-blocker due to low output heart failure. - Reportedly he is not a candidate for ICD given poor compliance with medications and CKD stage IV  Left lower extremity DVT - Initiated loading dose Eliquis .  Will need indefinite Eliquis  at discharge.  He was meant to be on this medication for A-fib but appears last  time he picked up medication was November 2024 - Have discussed this extensively with the patient.  Elevated troponins - Likely secondary to chronic myocardial injury, has been chronically elevated in the past - No STEMI - Cardiology following as above  Hypoxia - Secondary to volume overload. - Continue diuresis as kidney  function can tolerate. - Continue to wean oxygen as tolerated  Nausea, vomiting, abdominal pain - Resolved.  Paroxysmal A-fib - DCCV November 2024 - Atypical flutter today with variable rates, occasionally 130s - Amio IV per cardiology - Continue to replete electrolytes - Continue on telemetry  CKD stage IV - Creatinine baseline near 3.4-3.8. - Creatinine continues to worsen.  This is likely all cardiorenal syndrome - Despite worsening creatinine patient continues to have good urine output.  At this time does not necessitate urgent dialysis but this is likely in his future within the next 6 months - Nephrology has been consulted, appreciate their recommendations. - Avoid additional nephrotoxic meds if able  Type 2 diabetes - Jardiance  given worsening CKD as above. - Initiate CBGs and SSI, titrate as needed   History of CVA Hypertension Hyperlipidemia - Continue statin as above  OSA - CPAP at night  Liver cirrhosis Remote history of alcohol use disorder - CMP stable.  BMI 37 Obesity - Outpatient follow up for lifestyle modification and risk factor management  DVT prophylaxis: Loading dose Eliquis    Code Status: Full Code Disposition:  Inpatient. Decompensated heart failure.  On milrinone .  Cardiology following and managing  Consultants:  Treatment Team:  Consulting Physician: Hector Hamilton, MD Consulting Physician: Hector [name], [place], MD  Procedures:  RHC 4/30  Antimicrobials:  Anti-infectives (From admission, onward)    Start     Dose/Rate Route Frequency Ordered Stop   06/21/23 0615  cefTRIAXone  (ROCEPHIN ) 1 g in sodium chloride  0.9 % 100 mL IVPB        1 g 200 mL/hr over 30 Minutes Intravenous  Once 06/21/23 0609 06/21/23 0654       Data Reviewed: I have personally reviewed following labs and imaging studies CBC: Recent Labs  Lab 06/21/23 0250 06/22/23 0431 06/23/23 1302 06/23/23 1548 06/25/23 0520 06/26/23 0500  WBC 7.8 7.7  --  7.3 8.8 8.3   NEUTROABS 5.6  --   --   --  6.3 5.8  HGB 13.2 12.9* 15.0  15.3 14.7 14.4 13.5  HCT 41.5 41.5 44.0  45.0 45.4 43.7 41.0  MCV 86.1 85.7  --  83.2 82.6 82.0  PLT 238 250  --  251 273 257   Basic Metabolic Panel: Recent Labs  Lab 06/22/23 0431 06/23/23 0408 06/23/23 1302 06/23/23 1548 06/24/23 0444 06/25/23 0520 06/26/23 0500  NA 135 136 137  136  --  133* 130* 130*  K 4.4 3.6 4.1  4.2  --  3.5 3.6 3.2*  CL 100 97*  --   --  91* 89* 88*  CO2 29 29  --   --  29 28 27   GLUCOSE 108* 99  --   --  147* 129* 186*  BUN 37* 44*  --   --  53* 58* 64*  CREATININE 3.82* 3.88*  --  3.93* 4.28* 4.98* 4.83*  CALCIUM  8.5* 8.9  --   --  8.9 8.8* 8.4*  MG  --  2.3  --   --   --  2.3 2.4  PHOS  --   --   --   --   --  4.9* 4.6  GFR: Estimated Creatinine Clearance: 22.1 mL/min (A) (by C-G formula based on SCr of 4.83 mg/dL (H)). Liver Function Tests: Recent Labs  Lab 06/21/23 0250 06/22/23 0431 06/25/23 0520 06/26/23 0500  AST 19 18 21 21   ALT 14 14 13 13   ALKPHOS 96 90 103 94  BILITOT 1.9* 1.2 1.7* 1.3*  PROT 6.9 7.0 7.5 6.9  ALBUMIN 3.5 3.4* 3.7 3.5   CBG: No results for input(s): "GLUCAP" in the last 168 hours.  Recent Results (from the past 240 hours)  Urine Culture     Status: None   Collection Time: 06/21/23  5:29 AM   Specimen: Urine, Clean Catch  Result Value Ref Range Status   Specimen Description   Final    URINE, CLEAN CATCH Performed at [facility], [address], [place], [place] [address]    Special Requests   Final    NONE Performed at [facility], [address] [place], [place] [address]    Culture   Final    NO GROWTH Performed at [facility], [address] [place], [place] [address]    Report Status 06/22/2023 FINAL  Final     Radiology Studies: US  Venous Img Lower Bilateral (DVT) Result Date: 06/24/2023 CLINICAL DATA:  Bilateral leg edema EXAM: BILATERAL LOWER EXTREMITY VENOUS DOPPLER ULTRASOUND TECHNIQUE:  Gray-scale sonography with compression, as well as color and duplex ultrasound, were performed to evaluate the deep venous system(s) from the level of the common femoral vein through the popliteal and proximal calf veins. COMPARISON:  None Available. FINDINGS: VENOUS Nonocclusive thrombus noted in the left mid to distal femoral vein and left popliteal vein. Patent left common femoral vein and profundus femoral vein and patent left calf veins. No right lower extremity DVT is demonstrated. OTHER None. Limitations: none IMPRESSION: 1. Nonocclusive thrombus in the LEFT mid and distal femoral vein and left popliteal vein compatible with acute DVT. 2. No findings of RIGHT lower extremity DVT. Electronically Signed   By: Freida Jes M.D.   On: 06/24/2023 17:40    Scheduled Meds:  amiodarone   200 mg Oral Daily   apixaban   10 mg Oral BID   Followed by   Cecily Cohen ON 07/02/2023] apixaban   5 mg Oral BID   atorvastatin   80 mg Oral Daily   Chlorhexidine  Gluconate Cloth  6 each Topical Daily   influenza vac split trivalent PF  0.5 mL Intramuscular Tomorrow-1000   levothyroxine   125 mcg Oral QAC breakfast   lipase/protease/amylase  36,000 Units Oral TID AC   sodium chloride  flush  10-40 mL Intracatheter Q12H   sodium chloride  flush  3 mL Intravenous Q12H   sodium chloride  flush  3 mL Intravenous Q12H   Continuous Infusions:  milrinone  0.25 mcg/kg/min (06/26/23 0556)     LOS: 4 days  MDM: Patient is high risk for one or more organ failure.  They necessitate ongoing hospitalization for continued IV therapies and subsequent lab monitoring. Total time spent interpreting labs and vitals, reviewing the medical record, coordinating care amongst consultants and care team members, directly assessing and discussing care with the patient and/or family: 55 min  Aiana Nordquist, DO [facility]  To contact the attending physician between 7A-7P please use Epic Chat. To contact the covering physician during  after hours 7P-7A, please review Amion.  06/26/2023, 9:22 AM   *This document has been created with the assistance of dictation software. Please excuse typographical errors. *

## 2023-06-27 DIAGNOSIS — R570 Cardiogenic shock: Secondary | ICD-10-CM | POA: Diagnosis not present

## 2023-06-27 DIAGNOSIS — I5023 Acute on chronic systolic (congestive) heart failure: Secondary | ICD-10-CM | POA: Diagnosis not present

## 2023-06-27 DIAGNOSIS — I130 Hypertensive heart and chronic kidney disease with heart failure and stage 1 through stage 4 chronic kidney disease, or unspecified chronic kidney disease: Secondary | ICD-10-CM | POA: Diagnosis not present

## 2023-06-27 DIAGNOSIS — N184 Chronic kidney disease, stage 4 (severe): Secondary | ICD-10-CM | POA: Diagnosis not present

## 2023-06-27 LAB — COMPREHENSIVE METABOLIC PANEL WITH GFR
ALT: 13 U/L (ref 0–44)
AST: 29 U/L (ref 15–41)
Albumin: 3.4 g/dL — ABNORMAL LOW (ref 3.5–5.0)
Alkaline Phosphatase: 91 U/L (ref 38–126)
Anion gap: 15 (ref 5–15)
BUN: 69 mg/dL — ABNORMAL HIGH (ref 6–20)
CO2: 28 mmol/L (ref 22–32)
Calcium: 8.8 mg/dL — ABNORMAL LOW (ref 8.9–10.3)
Chloride: 89 mmol/L — ABNORMAL LOW (ref 98–111)
Creatinine, Ser: 4.87 mg/dL — ABNORMAL HIGH (ref 0.61–1.24)
GFR, Estimated: 13 mL/min — ABNORMAL LOW (ref >60)
Glucose, Bld: 147 mg/dL — ABNORMAL HIGH (ref 70–99)
Potassium: 3.5 mmol/L (ref 3.5–5.1)
Sodium: 132 mmol/L — ABNORMAL LOW (ref 135–145)
Total Bilirubin: 1.1 mg/dL (ref 0.0–1.2)
Total Protein: 6.9 g/dL (ref 6.5–8.1)

## 2023-06-27 LAB — COOXEMETRY PANEL
Carboxyhemoglobin: 1.7 % — ABNORMAL HIGH (ref 0.5–1.5)
Carboxyhemoglobin: 1.5 % (ref 0.5–1.5)
Methemoglobin: 0.1 % (ref 0.0–1.5)
Methemoglobin: <0.7 % (ref 0.0–1.5)
O2 Saturation: 97.8 %
O2 Saturation: 64.3 %
Total hemoglobin: 13.7 g/dL (ref 12.0–16.0)
Total hemoglobin: 13.9 g/dL (ref 12.0–16.0)
Total oxygen content: 96 %
Total oxygen content: 63.3 %

## 2023-06-27 LAB — CBC WITH DIFFERENTIAL/PLATELET
Abs Immature Granulocytes: 0.03 10*3/uL (ref 0.00–0.07)
Basophils Absolute: 0.1 10*3/uL (ref 0.0–0.1)
Basophils Relative: 1 %
Eosinophils Absolute: 0.3 10*3/uL (ref 0.0–0.5)
Eosinophils Relative: 4 %
HCT: 40.3 % (ref 39.0–52.0)
Hemoglobin: 13.4 g/dL (ref 13.0–17.0)
Immature Granulocytes: 0 %
Lymphocytes Relative: 18 %
Lymphs Abs: 1.3 10*3/uL (ref 0.7–4.0)
MCH: 27 pg (ref 26.0–34.0)
MCHC: 33.3 g/dL (ref 30.0–36.0)
MCV: 81.3 fL (ref 80.0–100.0)
Monocytes Absolute: 0.9 10*3/uL (ref 0.1–1.0)
Monocytes Relative: 12 %
Neutro Abs: 5 10*3/uL (ref 1.7–7.7)
Neutrophils Relative %: 65 %
Platelets: 248 10*3/uL (ref 150–400)
RBC: 4.96 MIL/uL (ref 4.22–5.81)
RDW: 16.3 % — ABNORMAL HIGH (ref 11.5–15.5)
WBC: 7.5 10*3/uL (ref 4.0–10.5)
nRBC: 0 % (ref 0.0–0.2)

## 2023-06-27 LAB — PHOSPHORUS: Phosphorus: 4.5 mg/dL (ref 2.5–4.6)

## 2023-06-27 LAB — MAGNESIUM: Magnesium: 2.6 mg/dL — ABNORMAL HIGH (ref 1.7–2.4)

## 2023-06-27 MED ORDER — POTASSIUM CHLORIDE CRYS ER 20 MEQ PO TBCR
40.0000 meq | EXTENDED_RELEASE_TABLET | Freq: Once | ORAL | Status: AC
  Administered 2023-06-27: 40 meq via ORAL
  Filled 2023-06-27: qty 2

## 2023-06-27 MED ORDER — ORAL CARE MOUTH RINSE: 15.0000 mL | OROMUCOSAL | Status: DC | PRN

## 2023-06-27 MED ORDER — AMIODARONE HCL 200 MG PO TABS
200.0000 mg | ORAL_TABLET | Freq: Every day | ORAL | Status: DC
  Administered 2023-06-28 – 2023-07-01 (×4): 200 mg via ORAL
  Filled 2023-06-27 (×4): qty 1

## 2023-06-27 NOTE — Progress Notes (Addendum)
 PROGRESS NOTE    Hector [name]  ZOX:[id number] DOB: [birth year] DOA: 06/21/2023 PCP: Dionicia Frater, MD  No chief complaint on file.   Hospital Course:  Hector [name] is a 58 year old male with heart failure with reduced EF less than 20%, nonischemic cardiomyopathy, history of alcohol use disorder sober for 13 years, paroxysmal A-fib/flutter on Eliquis , OSA on CPAP, CKD stage IV, CVA, hypertension, hyperlipidemia, type 2 diabetes, presented to the hospital with abdominal pain, nausea, vomiting and increased lower extremity swelling with progressive dyspnea.  He was admitted for acute on chronic CHF exacerbation.  Subjective: Had issues with atypical flutter RVR overnight.  Has initiated on amio drip.  Rate better controlled now.  Sinus rhythm as of 0530. He has no acute complaints this morning.  Denies chest pain  Objective: Vitals:   06/27/23 0600 06/27/23 0700 06/27/23 0747 06/27/23 0800  BP: 130/81 138/88 (!) 124/92 124/75  Pulse:   88   Resp:   18   Temp:   98.6 F (37 C)   TempSrc:   Oral   SpO2:   93%   Weight:      Height:        Intake/Output Summary (Last 24 hours) at 06/27/2023 1010 Last data filed at 06/27/2023 0800 Gross per 24 hour  Intake 1608.22 ml  Output 925 ml  Net 683.22 ml   Filed Weights   06/24/23 0610 06/26/23 0420 06/27/23 0530  Weight: 128.1 kg 121.4 kg 127.3 kg    Examination: General exam: Appears calm and comfortable, NAD obese Respiratory system: No work of breathing, symmetric chest wall expansion Cardiovascular system: S1 & S2 heard, RRR.  Gastrointestinal system: Abdomen is nondistended, soft and nontender.  Neuro: Alert and oriented. No focal neurological deficits. Extremities: No lower extremity edema today Skin: No rashes, lesions Psychiatry: Demonstrates appropriate judgement and insight. Mood & affect appropriate for situation.   Assessment & Plan:  Principal Problem:   Acute on chronic HFrEF (heart failure with  reduced ejection fraction) (HCC) Active Problems:   Nausea and vomiting   Chronic renal impairment, stage 4 (severe) (HCC)   OSA (obstructive sleep apnea)   Type II diabetes mellitus with renal manifestations (HCC)   Hyperbilirubinemia   Essential hypertension   Alcohol use, unspecified, in remission   Gout   Paroxysmal atrial flutter (HCC)   Alcoholic cirrhosis of liver without ascites (HCC)   Intractable vomiting with nausea   NSTEMI (non-ST elevated myocardial infarction) (HCC)   Acute on chronic systolic CHF Nonischemic cardiomyopathy - BNP 756 on arrival - TEE November 2024: EF less than 20% - RHC 4/30: PCWP elevated, mixed pulmonary venous/pulmonary arterial hypertension.  Low cardiac output. - Currently on milrinone , titration based on Co. ox per cardiology - Required IV amiodarone  overnight for A-fib RVR, has converted back to sinus.  Cardiology returning to p.o. amio - Cardiorenal syndrome continues to worsen.  Renal function declining - Home meds include high-dose Lasix , isosorbide , hydralazine , Jardiance .  Worsening kidney function many of these medications have been held.  Diuresis per nephro cardiology. - GDMT limited due to low blood pressures and worsening kidney function.  BiDil  held, Jardiance  held.  Unable to start beta-blocker due to low output heart failure. - Reportedly he is not a candidate for ICD given poor compliance with medications and CKD stage IV  Left lower extremity DVT - Initiated loading dose Eliquis .  Will need indefinite Eliquis  at discharge.  He was meant to be on this medication for A-fib but  appears last time he picked up medication was November 2024 - Have discussed this extensively with the patient.  Elevated troponins - Likely secondary to chronic myocardial injury, has been chronically elevated in the past - No STEMI - Cardiology following as above  Hypoxia - Secondary to volume overload. - Continue diuresis as kidney function can  tolerate. - Continue to wean oxygen as tolerated  Nausea, vomiting, abdominal pain - Resolved.  Paroxysmal A-fib - DCCV November 2024 - Atypical flutter with rates into the 130s on 5/3.  Initiated amiodarone  drip.  Better controlled now - Back on p.o. amiodarone  per cardiology - Continue to monitor and replete electrolytes as needed - Continue to monitor on telemetry  CKD stage IV - Creatinine baseline near 3.4-3.8. - Creatinine continues to worsen secondary to cardiorenal syndrome - Continues to have urinary output that does appear to be slowing.  Given inability to diurese given kidney function he may be nearing the need for dialysis - Nephrology has been consulted, appreciate their recommendations. - Hepatitis B labs ordered, predialysis planning - Avoid additional nephrotoxic meds if able  Type 2 diabetes - Jardiance  has been held given worsening CKD as above. - He is refusing CBGs and sliding scale insulin  citing that he is not diabetic despite education - Hemoglobin A1c ordered and pending - Continue to monitor blood glucose on daily CMPs, remains elevated but not above 200  History of CVA Hypertension Hyperlipidemia - Continue statin as above  OSA - CPAP at night  Liver cirrhosis Remote history of alcohol use disorder - CMP stable.  BMI 37 Obesity - Outpatient follow up for lifestyle modification and risk factor management  DVT prophylaxis: Loading dose Eliquis    Code Status: Full Code Disposition:  Inpatient. Decompensated heart failure.  On milrinone , possible dialysis this admission.  Cardiology following and managing  Consultants:  Treatment Team:  Consulting Physician: Wenona Hamilton, MD Consulting Physician: Lateef, Munsoor, MD  Procedures:  RHC 4/30  Antimicrobials:  Anti-infectives (From admission, onward)    Start     Dose/Rate Route Frequency Ordered Stop   06/21/23 0615  cefTRIAXone  (ROCEPHIN ) 1 g in sodium chloride  0.9 % 100 mL IVPB         1 g 200 mL/hr over 30 Minutes Intravenous  Once 06/21/23 0609 06/21/23 0654       Data Reviewed: I have personally reviewed following labs and imaging studies CBC: Recent Labs  Lab 06/21/23 0250 06/22/23 0431 06/23/23 1302 06/23/23 1548 06/25/23 0520 06/26/23 0500 06/27/23 0530  WBC 7.8 7.7  --  7.3 8.8 8.3 7.5  NEUTROABS 5.6  --   --   --  6.3 5.8 5.0  HGB 13.2 12.9* 15.0  15.3 14.7 14.4 13.5 13.4  HCT 41.5 41.5 44.0  45.0 45.4 43.7 41.0 40.3  MCV 86.1 85.7  --  83.2 82.6 82.0 81.3  PLT 238 250  --  251 273 257 248   Basic Metabolic Panel: Recent Labs  Lab 06/23/23 0408 06/23/23 1302 06/23/23 1548 06/24/23 0444 06/25/23 0520 06/26/23 0500 06/27/23 0530  NA 136 137  136  --  133* 130* 130* 132*  K 3.6 4.1  4.2  --  3.5 3.6 3.2* 3.5  CL 97*  --   --  91* 89* 88* 89*  CO2 29  --   --  29 28 27 28   GLUCOSE 99  --   --  147* 129* 186* 147*  BUN 44*  --   --  53* 58*  64* 69*  CREATININE 3.88*  --  3.93* 4.28* 4.98* 4.83* 4.87*  CALCIUM  8.9  --   --  8.9 8.8* 8.4* 8.8*  MG 2.3  --   --   --  2.3 2.4 2.6*  PHOS  --   --   --   --  4.9* 4.6 4.5   GFR: Estimated Creatinine Clearance: 22.5 mL/min (A) (by C-G formula based on SCr of 4.87 mg/dL (H)). Liver Function Tests: Recent Labs  Lab 06/21/23 0250 06/22/23 0431 06/25/23 0520 06/26/23 0500 06/27/23 0530  AST 19 18 21 21 29   ALT 14 14 13 13 13   ALKPHOS 96 90 103 94 91  BILITOT 1.9* 1.2 1.7* 1.3* 1.1  PROT 6.9 7.0 7.5 6.9 6.9  ALBUMIN 3.5 3.4* 3.7 3.5 3.4*   CBG: Recent Labs  Lab 06/26/23 2048  GLUCAP 197*    Recent Results (from the past 240 hours)  Urine Culture     Status: None   Collection Time: 06/21/23  5:29 AM   Specimen: Urine, Clean Catch  Result Value Ref Range Status   Specimen Description   Final    URINE, CLEAN CATCH Performed at [facility], [address] [place], [place] [address]    Special Requests   Final    NONE Performed at [facility], [address], [place], [place] [address]    Culture   Final    NO GROWTH Performed at [facility], [address], [place], [place] [address]    Report Status 06/22/2023 FINAL  Final     Radiology Studies: No results found.   Scheduled Meds:  apixaban   10 mg Oral BID   Followed by   Cecily Cohen ON 07/02/2023] apixaban   5 mg Oral BID   atorvastatin   80 mg Oral Daily   Chlorhexidine  Gluconate Cloth  6 each Topical Daily   influenza vac split trivalent PF  0.5 mL Intramuscular Tomorrow-1000   insulin  aspart  0-9 Units Subcutaneous TID WC   lactulose  10 g Oral BID   levothyroxine   125 mcg Oral QAC breakfast   lipase/protease/amylase  36,000 Units Oral TID AC   sodium chloride  flush  10-40 mL Intracatheter Q12H   sodium chloride  flush  3 mL Intravenous Q12H   sodium chloride  flush  3 mL Intravenous Q12H   Continuous Infusions:  amiodarone  30 mg/hr (06/27/23 0800)   milrinone  0.25 mcg/kg/min (06/27/23 0800)     LOS: 5 days  MDM: Patient is high risk for one or more organ failure.  They necessitate ongoing hospitalization for continued IV therapies and subsequent lab monitoring. Total time spent interpreting labs and vitals, reviewing the medical record, coordinating care amongst consultants and care team members, directly assessing and discussing care with the patient and/or family: 55 min  Leola Fiore, DO [facility]  To contact the attending physician between 7A-7P please use Epic Chat. To contact the covering physician during after hours 7P-7A, please review Amion.  06/27/2023, 10:10 AM   *This document has been created with the assistance of dictation software. Please excuse typographical errors. *

## 2023-06-27 NOTE — Progress Notes (Signed)
 [facility]  ROUNDING NOTE   Subjective:   Hector [name]  is a 58 y.o. male with past medical conditions including hypertension, paroxysmal A-fib on Eliquis , CHF with a EF less than 20%, and chronic kidney disease stage IV.  Patient presents to the emergency department complaining of shortness of breath and swelling and has been admitted for Generalized abdominal pain [R10.84] Hypertensive urgency [I16.0] Calculus of gallbladder without cholecystitis without obstruction [K80.20] Urinary tract infection without hematuria, site unspecified [N39.0] Intractable vomiting with nausea [R11.2] Acute on chronic congestive heart failure, unspecified heart failure type (HCC) [I50.9] Nausea and vomiting, unspecified vomiting type [R11.2]  Patient is known to our practice from previous admissions and is currently followed by [facility] nephrology.  Patient states he began having shortness of breath about a week prior to presentation.  States he has been compliant with all medications including diuretics.  Update Patient sitting at bedside Alert and oriented Appetite appropriate Room air  Creatinine 4.87 Urine output  Objective:  Vital signs in last 24 hours:  Temp:  [98.2 F (36.8 C)-98.7 F (37.1 C)] 98.2 F (36.8 C) (05/04 1158) Pulse Rate:  [51-135] 80 (05/04 1158) Resp:  [16-20] 20 (05/04 1158) BP: (93-138)/(53-101) 100/68 (05/04 1200) SpO2:  [91 %-94 %] 94 % (05/04 1158) Weight:  [127.3 kg] 127.3 kg (05/04 0530)  Weight change: 5.925 kg Filed Weights   06/24/23 0610 06/26/23 0420 06/27/23 0530  Weight: 128.1 kg 121.4 kg 127.3 kg    Intake/Output: I/O last 3 completed shifts: In: 1968.1 [P.O.:1280; I.V.:688.1] Out: 1425 [Urine:1425]   Intake/Output this shift:  Total I/O In: 157.2 [I.V.:157.2] Out: -   Physical Exam: General: NAD  Head: Normocephalic, atraumatic. Moist oral mucosal membranes  Eyes: Anicteric  Lungs:  Clear to auscultation, normal  effort  Heart: Regular rate and rhythm  Abdomen:  Soft, nontender,   Extremities: No peripheral edema.  Neurologic: Alert and oriented, moving all four extremities  Skin: No lesions  Access: None    Basic Metabolic Panel: Recent Labs  Lab 06/23/23 0408 06/23/23 1302 06/23/23 1548 06/24/23 0444 06/25/23 0520 06/26/23 0500 06/27/23 0530  NA 136 137  136  --  133* 130* 130* 132*  K 3.6 4.1  4.2  --  3.5 3.6 3.2* 3.5  CL 97*  --   --  91* 89* 88* 89*  CO2 29  --   --  29 28 27 28   GLUCOSE 99  --   --  147* 129* 186* 147*  BUN 44*  --   --  53* 58* 64* 69*  CREATININE 3.88*  --  3.93* 4.28* 4.98* 4.83* 4.87*  CALCIUM  8.9  --   --  8.9 8.8* 8.4* 8.8*  MG 2.3  --   --   --  2.3 2.4 2.6*  PHOS  --   --   --   --  4.9* 4.6 4.5    Liver Function Tests: Recent Labs  Lab 06/21/23 0250 06/22/23 0431 06/25/23 0520 06/26/23 0500 06/27/23 0530  AST 19 18 21 21 29   ALT 14 14 13 13 13   ALKPHOS 96 90 103 94 91  BILITOT 1.9* 1.2 1.7* 1.3* 1.1  PROT 6.9 7.0 7.5 6.9 6.9  ALBUMIN 3.5 3.4* 3.7 3.5 3.4*   Recent Labs  Lab 06/21/23 0250  LIPASE 27   No results for input(s): "AMMONIA" in the last 168 hours.  CBC: Recent Labs  Lab 06/21/23 0250 06/22/23 0431 06/23/23 1302 06/23/23 1548 06/25/23  1610 06/26/23 0500 06/27/23 0530  WBC 7.8 7.7  --  7.3 8.8 8.3 7.5  NEUTROABS 5.6  --   --   --  6.3 5.8 5.0  HGB 13.2 12.9* 15.0  15.3 14.7 14.4 13.5 13.4  HCT 41.5 41.5 44.0  45.0 45.4 43.7 41.0 40.3  MCV 86.1 85.7  --  83.2 82.6 82.0 81.3  PLT 238 250  --  251 273 257 248    Cardiac Enzymes: No results for input(s): "CKTOTAL", "CKMB", "CKMBINDEX", "TROPONINI" in the last 168 hours.  BNP: Invalid input(s): "POCBNP"  CBG: Recent Labs  Lab 06/26/23 2048  GLUCAP 197*    Microbiology: Results for orders placed or performed during the hospital encounter of 06/21/23  Urine Culture     Status: None   Collection Time: 06/21/23  5:29 AM   Specimen: Urine, Clean Catch   Result Value Ref Range Status   Specimen Description   Final    URINE, CLEAN CATCH Performed at [facility], [address], [place], [place] [address]    Special Requests   Final    NONE Performed at [facility], [address] [place], [place] [address]    Culture   Final    NO GROWTH Performed at [facility], [address] [place], [place] [address]    Report Status 06/22/2023 FINAL  Final    Coagulation Studies: No results for input(s): "LABPROT", "INR" in the last 72 hours.  Urinalysis: No results for input(s): "COLORURINE", "LABSPEC", "PHURINE", "GLUCOSEU", "HGBUR", "BILIRUBINUR", "KETONESUR", "PROTEINUR", "UROBILINOGEN", "NITRITE", "LEUKOCYTESUR" in the last 72 hours.  Invalid input(s): "APPERANCEUR"    Imaging: No results found.    Medications:    amiodarone  30 mg/hr (06/27/23 1100)   milrinone  0.25 mcg/kg/min (06/27/23 1205)    apixaban   10 mg Oral BID   Followed by   Cecily Cohen ON 07/02/2023] apixaban   5 mg Oral BID   atorvastatin   80 mg Oral Daily   Chlorhexidine  Gluconate Cloth  6 each Topical Daily   influenza vac split trivalent PF  0.5 mL Intramuscular Tomorrow-1000   insulin  aspart  0-9 Units Subcutaneous TID WC   lactulose  10 g Oral BID   levothyroxine   125 mcg Oral QAC breakfast   lipase/protease/amylase  36,000 Units Oral TID AC   sodium chloride  flush  10-40 mL Intracatheter Q12H   sodium chloride  flush  3 mL Intravenous Q12H   sodium chloride  flush  3 mL Intravenous Q12H   acetaminophen , morphine  injection, ondansetron  **OR** ondansetron  (ZOFRAN ) IV, ondansetron  (ZOFRAN ) IV, mouth rinse, sodium chloride  flush, sodium chloride  flush, sodium chloride  flush  Assessment/ Plan:  [name] is a 58 y.o.  male  with past medical conditions including hypertension, paroxysmal A-fib on Eliquis , CHF with a EF less than 20%, and chronic kidney disease stage IV.  Patient presents to the emergency department  complaining of shortness of breath and swelling and has been admitted for Generalized abdominal pain [R10.84] Hypertensive urgency [I16.0] Calculus of gallbladder without cholecystitis without obstruction [K80.20] Urinary tract infection without hematuria, site unspecified [N39.0] Intractable vomiting with nausea [R11.2] Acute on chronic congestive heart failure, unspecified heart failure type (HCC) [I50.9] Nausea and vomiting, unspecified vomiting type [R11.2]   Acute Kidney Injury on chronic kidney disease stage 4 with baseline creatinine 3.35 and GFR of 20 on 05/06/2023.  Follows with [facility] nephrology. Acute kidney injury secondary to hemodynamic instability and suspected cardiorenal syndrome Patient did undergo right heart cath however contrast  is usually not utilized for this.    Discussed the possibility of proceeding with renal replacement therapy. Renal function has not responded as desired and urine output has decreased. Will order Hepatitis B labs for am. Will monitor labs in am to determine needs to move forward. Patient agreeable, if needed.   Lab Results  Component Value Date   CREATININE 4.87 (H) 06/27/2023   CREATININE 4.83 (H) 06/26/2023   CREATININE 4.98 (H) 06/25/2023    Intake/Output Summary (Last 24 hours) at 06/27/2023 1236 Last data filed at 06/27/2023 1100 Gross per 24 hour  Intake 1565.37 ml  Output 925 ml  Net 640.37 ml    2.  Chronic systolic heart failure.  TEE echo completed on 01/19/2023 shows EF less than 20%.  Cardiology following and performed right heart cath on 4/30 which showed mixed pulmonary venous/arterial hypertension with low cardiac output and PCWP elevation.  Remains on milrinone  and amiodarone  drip.  3. Anemia of chronic kidney disease Lab Results  Component Value Date   HGB 13.4 06/27/2023    Hemoglobin remains at goal   LOS: 5 Anastacia Reinecke 5/4/202512:36 PM

## 2023-06-27 NOTE — Progress Notes (Signed)
 Rounding Note    Patient Name: Hector [name] Date of Encounter: 06/27/2023  [facility] Cardiologist: Belva Boyden, MD   Subjective   Went into atrial flutter yesterday afternoon, sustained in the 130s. Started IV amiodarone . Converted back to sinus around 5:30 AM. Resting comfortably on my interview. Reports fatigue in general, not at baseline. Breathing stable. Feels a little better back in sinus.  Inpatient Medications    Scheduled Meds:  [START ON 06/28/2023] amiodarone   200 mg Oral Daily   apixaban   10 mg Oral BID   Followed by   Cecily Cohen ON 07/02/2023] apixaban   5 mg Oral BID   atorvastatin   80 mg Oral Daily   Chlorhexidine  Gluconate Cloth  6 each Topical Daily   influenza vac split trivalent PF  0.5 mL Intramuscular Tomorrow-1000   insulin  aspart  0-9 Units Subcutaneous TID WC   lactulose  10 g Oral BID   levothyroxine   125 mcg Oral QAC breakfast   lipase/protease/amylase  36,000 Units Oral TID AC   potassium chloride   40 mEq Oral Once   sodium chloride  flush  10-40 mL Intracatheter Q12H   sodium chloride  flush  3 mL Intravenous Q12H   sodium chloride  flush  3 mL Intravenous Q12H   Continuous Infusions:  milrinone  0.25 mcg/kg/min (06/27/23 1205)   PRN Meds: acetaminophen , morphine  injection, ondansetron  **OR** ondansetron  (ZOFRAN ) IV, ondansetron  (ZOFRAN ) IV, mouth rinse, sodium chloride  flush, sodium chloride  flush, sodium chloride  flush   Vital Signs    Vitals:   06/27/23 1000 06/27/23 1100 06/27/23 1158 06/27/23 1200  BP: (!) 126/99 114/67 109/84 100/68  Pulse:   80   Resp:   20   Temp:   98.2 F (36.8 C)   TempSrc:   Oral   SpO2:   94%   Weight:      Height:        Intake/Output Summary (Last 24 hours) at 06/27/2023 1310 Last data filed at 06/27/2023 1100 Gross per 24 hour  Intake 1565.37 ml  Output 925 ml  Net 640.37 ml      06/27/2023    5:30 AM 06/26/2023    4:20 AM 06/24/2023    6:10 AM  Last 3 Weights  Weight (lbs) 280 lb 11.2 oz  267 lb 10.2 oz 282 lb 8 oz  Weight (kg) 127.325 kg 121.4 kg 128.141 kg      Telemetry    NSR since about 5:30 AM, was in sustained atrial flutter prior to this- Personally Reviewed  Physical Exam   GEN: No acute distress.   Neck: difficult to visualize JVD 2/2 body habitus Cardiac: RRR, no murmurs, rubs, or gallops.  Respiratory: Clear to auscultation bilaterally. GI: Soft, nontender, non-distended  MS: bilateral minimal LE edema; No deformity. Neuro:  Nonfocal  Psych: Normal affect   New pertinent results (labs, ECG, imaging, cardiac studies)    RHC personally reviewed  Assessment & Plan    Acute on chronic systolic and diastolic heart failure Nonischemic cardiomyopathy Cardiorenal syndrome, cardiogenic shock -EF <20% -RHC with PA sat 57%, CI 1.68, elevated wedge, mixed pulm venous/pulm arterial hypertension. Started on milrinone  0.25 mcg/kg/min -coox this AM 64, down likely 2/2 atrial flutter. Now that he is back in sinus, expect this to improve -urine output has slowed, based on note today, nephrology considering RRT if his output/function continues to decline -was on bidil , jardiance , torsemide /metolazone  as an outpatient. Holding to allow for higher perfusion pressure -has PICC line but CVP not available. Diuretics currently  on hold  Hypokalemia: K 3.5, with renal disease need to be cautious. Gave 40 meq yesterday and incremented 0.3, will give another 40 meq today  Atrial arrhythmia -continue amiodarone , transitioned to IV yesterday with sustained atrial flutter, will transition back to oral today.  Chronic kidney disease, stage 4, with acute kidney injury this admission History of hypertension -Cr peaked 4.98, today 4.87 -nephrology following -scaling back on meds as above to allow for better perfusion pressures  History of DVT: restarted on apixaban  this admission  OSA: continue CPAP    Signed, Sheryle Donning, MD  06/27/2023, 1:10 PM

## 2023-06-27 NOTE — Plan of Care (Signed)
  Problem: Education: Goal: Knowledge of General Education information will improve Description: Including pain rating scale, medication(s)/side effects and non-pharmacologic comfort measures Outcome: Progressing   Problem: Health Behavior/Discharge Planning: Goal: Ability to manage health-related needs will improve Outcome: Progressing   Problem: Clinical Measurements: Goal: Ability to maintain clinical measurements within normal limits will improve Outcome: Progressing Goal: Will remain free from infection Outcome: Progressing Goal: Diagnostic test results will improve Outcome: Progressing Goal: Respiratory complications will improve Outcome: Progressing Goal: Cardiovascular complication will be avoided Outcome: Progressing   Problem: Activity: Goal: Risk for activity intolerance will decrease Outcome: Progressing   Problem: Nutrition: Goal: Adequate nutrition will be maintained Outcome: Progressing   Problem: Coping: Goal: Level of anxiety will decrease Outcome: Progressing   Problem: Elimination: Goal: Will not experience complications related to urinary retention Outcome: Progressing   Problem: Pain Managment: Goal: General experience of comfort will improve and/or be controlled Outcome: Progressing   Problem: Safety: Goal: Ability to remain free from injury will improve Outcome: Progressing   Problem: Skin Integrity: Goal: Risk for impaired skin integrity will decrease Outcome: Progressing   Problem: Education: Goal: Understanding of CV disease, CV risk reduction, and recovery process will improve Outcome: Progressing   Problem: Cardiovascular: Goal: Ability to achieve and maintain adequate cardiovascular perfusion will improve Outcome: Progressing   Problem: Coping: Goal: Ability to adjust to condition or change in health will improve Outcome: Progressing   Problem: Fluid Volume: Goal: Ability to maintain a balanced intake and output will  improve Outcome: Progressing   Problem: Health Behavior/Discharge Planning: Goal: Ability to manage health-related needs will improve Outcome: Progressing   Problem: Nutritional: Goal: Maintenance of adequate nutrition will improve Outcome: Progressing   Problem: Skin Integrity: Goal: Risk for impaired skin integrity will decrease Outcome: Progressing   Problem: Tissue Perfusion: Goal: Adequacy of tissue perfusion will improve Outcome: Progressing

## 2023-06-27 NOTE — Plan of Care (Signed)
   Problem: Education: Goal: Knowledge of General Education information will improve Description: Including pain rating scale, medication(s)/side effects and non-pharmacologic comfort measures Outcome: Progressing   Problem: Health Behavior/Discharge Planning: Goal: Ability to manage health-related needs will improve Outcome: Progressing   Problem: Clinical Measurements: Goal: Ability to maintain clinical measurements within normal limits will improve Outcome: Progressing Goal: Will remain free from infection Outcome: Progressing Goal: Diagnostic test results will improve Outcome: Progressing Goal: Respiratory complications will improve Outcome: Progressing Goal: Cardiovascular complication will be avoided Outcome: Progressing   Problem: Activity: Goal: Risk for activity intolerance will decrease Outcome: Progressing   Problem: Nutrition: Goal: Adequate nutrition will be maintained Outcome: Progressing   Problem: Coping: Goal: Level of anxiety will decrease Outcome: Progressing   Problem: Elimination: Goal: Will not experience complications related to bowel motility Outcome: Progressing Goal: Will not experience complications related to urinary retention Outcome: Progressing   Problem: Pain Managment: Goal: General experience of comfort will improve and/or be controlled Outcome: Progressing   Problem: Safety: Goal: Ability to remain free from injury will improve Outcome: Progressing   Problem: Skin Integrity: Goal: Risk for impaired skin integrity will decrease Outcome: Progressing   Problem: Education: Goal: Understanding of CV disease, CV risk reduction, and recovery process will improve Outcome: Progressing Goal: Individualized Educational Video(s) Outcome: Progressing   Problem: Activity: Goal: Ability to return to baseline activity level will improve Outcome: Progressing   Problem: Cardiovascular: Goal: Ability to achieve and maintain adequate  cardiovascular perfusion will improve Outcome: Progressing Goal: Vascular access site(s) Level 0-1 will be maintained Outcome: Progressing   Problem: Health Behavior/Discharge Planning: Goal: Ability to safely manage health-related needs after discharge will improve Outcome: Progressing   Problem: Education: Goal: Ability to describe self-care measures that may prevent or decrease complications (Diabetes Survival Skills Education) will improve Outcome: Progressing Goal: Individualized Educational Video(s) Outcome: Progressing   Problem: Coping: Goal: Ability to adjust to condition or change in health will improve Outcome: Progressing   Problem: Fluid Volume: Goal: Ability to maintain a balanced intake and output will improve Outcome: Progressing   Problem: Health Behavior/Discharge Planning: Goal: Ability to identify and utilize available resources and services will improve Outcome: Progressing Goal: Ability to manage health-related needs will improve Outcome: Progressing   Problem: Metabolic: Goal: Ability to maintain appropriate glucose levels will improve Outcome: Progressing   Problem: Nutritional: Goal: Maintenance of adequate nutrition will improve Outcome: Progressing Goal: Progress toward achieving an optimal weight will improve Outcome: Progressing   Problem: Skin Integrity: Goal: Risk for impaired skin integrity will decrease Outcome: Progressing   Problem: Tissue Perfusion: Goal: Adequacy of tissue perfusion will improve Outcome: Progressing

## 2023-06-28 ENCOUNTER — Other Ambulatory Visit (HOSPITAL_COMMUNITY): Payer: Self-pay

## 2023-06-28 ENCOUNTER — Telehealth (HOSPITAL_COMMUNITY): Payer: Self-pay | Admitting: Pharmacy Technician

## 2023-06-28 DIAGNOSIS — I5023 Acute on chronic systolic (congestive) heart failure: Secondary | ICD-10-CM | POA: Diagnosis not present

## 2023-06-28 DIAGNOSIS — R570 Cardiogenic shock: Secondary | ICD-10-CM | POA: Diagnosis not present

## 2023-06-28 DIAGNOSIS — N184 Chronic kidney disease, stage 4 (severe): Secondary | ICD-10-CM | POA: Diagnosis not present

## 2023-06-28 DIAGNOSIS — I130 Hypertensive heart and chronic kidney disease with heart failure and stage 1 through stage 4 chronic kidney disease, or unspecified chronic kidney disease: Secondary | ICD-10-CM | POA: Diagnosis not present

## 2023-06-28 LAB — CBC WITH DIFFERENTIAL/PLATELET
Abs Immature Granulocytes: 0.04 10*3/uL (ref 0.00–0.07)
Basophils Absolute: 0.1 10*3/uL (ref 0.0–0.1)
Basophils Relative: 1 %
Eosinophils Absolute: 0.3 10*3/uL (ref 0.0–0.5)
Eosinophils Relative: 4 %
HCT: 41 % (ref 39.0–52.0)
Hemoglobin: 13.4 g/dL (ref 13.0–17.0)
Immature Granulocytes: 1 %
Lymphocytes Relative: 13 %
Lymphs Abs: 1 10*3/uL (ref 0.7–4.0)
MCH: 26.9 pg (ref 26.0–34.0)
MCHC: 32.7 g/dL (ref 30.0–36.0)
MCV: 82.2 fL (ref 80.0–100.0)
Monocytes Absolute: 0.8 10*3/uL (ref 0.1–1.0)
Monocytes Relative: 11 %
Neutro Abs: 5.4 10*3/uL (ref 1.7–7.7)
Neutrophils Relative %: 70 %
Platelets: 222 10*3/uL (ref 150–400)
RBC: 4.99 MIL/uL (ref 4.22–5.81)
RDW: 16.2 % — ABNORMAL HIGH (ref 11.5–15.5)
WBC: 7.6 10*3/uL (ref 4.0–10.5)
nRBC: 0 % (ref 0.0–0.2)

## 2023-06-28 LAB — COMPREHENSIVE METABOLIC PANEL WITH GFR
ALT: 15 U/L (ref 0–44)
AST: 32 U/L (ref 15–41)
Albumin: 3.4 g/dL — ABNORMAL LOW (ref 3.5–5.0)
Alkaline Phosphatase: 93 U/L (ref 38–126)
Anion gap: 13 (ref 5–15)
BUN: 64 mg/dL — ABNORMAL HIGH (ref 6–20)
CO2: 27 mmol/L (ref 22–32)
Calcium: 8.6 mg/dL — ABNORMAL LOW (ref 8.9–10.3)
Chloride: 91 mmol/L — ABNORMAL LOW (ref 98–111)
Creatinine, Ser: 4.43 mg/dL — ABNORMAL HIGH (ref 0.61–1.24)
GFR, Estimated: 15 mL/min — ABNORMAL LOW (ref >60)
Glucose, Bld: 170 mg/dL — ABNORMAL HIGH (ref 70–99)
Potassium: 3.5 mmol/L (ref 3.5–5.1)
Sodium: 131 mmol/L — ABNORMAL LOW (ref 135–145)
Total Bilirubin: 1 mg/dL (ref 0.0–1.2)
Total Protein: 7.1 g/dL (ref 6.5–8.1)

## 2023-06-28 LAB — COOXEMETRY PANEL
Carboxyhemoglobin: 1.5 % (ref 0.5–1.5)
Methemoglobin: <0.7 % (ref 0.0–1.5)
O2 Saturation: 66.7 %
Total hemoglobin: 13.7 g/dL (ref 12.0–16.0)
Total oxygen content: 65.4 %

## 2023-06-28 LAB — MAGNESIUM: Magnesium: 2.7 mg/dL — ABNORMAL HIGH (ref 1.7–2.4)

## 2023-06-28 LAB — HEMOGLOBIN A1C
Hgb A1c MFr Bld: 7.3 % — ABNORMAL HIGH (ref 4.8–5.6)
Mean Plasma Glucose: 162.81 mg/dL

## 2023-06-28 LAB — HEPATITIS B SURFACE ANTIGEN: Hepatitis B Surface Ag: NONREACTIVE

## 2023-06-28 LAB — PHOSPHORUS: Phosphorus: 3.9 mg/dL (ref 2.5–4.6)

## 2023-06-28 MED ORDER — PANCRELIPASE (LIP-PROT-AMYL) 12000-38000 UNITS PO CPEP
36000.0000 [IU] | ORAL_CAPSULE | Freq: Every day | ORAL | Status: AC
  Administered 2023-06-28: 36000 [IU] via ORAL
  Filled 2023-06-28: qty 3

## 2023-06-28 MED ORDER — PANCRELIPASE (LIP-PROT-AMYL) 36000-114000 UNITS PO CPEP
36000.0000 [IU] | ORAL_CAPSULE | Freq: Three times a day (TID) | ORAL | Status: DC
  Administered 2023-06-28 – 2023-07-01 (×9): 36000 [IU] via ORAL
  Filled 2023-06-28 (×11): qty 1

## 2023-06-28 NOTE — Progress Notes (Signed)
 Patient refused CBG monitoring this am. Glucose on 0500 labs 170.

## 2023-06-28 NOTE — Telephone Encounter (Signed)
 Patient Product/process development scientist completed.    The patient is insured through [facility] MEDICAID.     Ran test claim for Eliquis  Starter Pack and the current 30 day co-pay is $4.00.   This test claim was processed through [facility]- copay amounts may vary at other pharmacies due to pharmacy/plan contracts, or as the patient moves through the different stages of their insurance plan.     Morgan Arab, CPHT Pharmacy Technician III Certified Patient Advocate [facility] Patient Advocate Team Direct Number: [phone]  Fax: [phone]

## 2023-06-28 NOTE — Progress Notes (Signed)
 [facility]  ROUNDING NOTE   Subjective:   Hector [name]  is a 58 y.o. male with past medical conditions including hypertension, paroxysmal A-fib on Eliquis , CHF with a EF less than 20%, and chronic kidney disease stage IV.  Patient presents to the emergency department complaining of shortness of breath and swelling and has been admitted for Generalized abdominal pain [R10.84] Hypertensive urgency [I16.0] Calculus of gallbladder without cholecystitis without obstruction [K80.20] Urinary tract infection without hematuria, site unspecified [N39.0] Intractable vomiting with nausea [R11.2] Acute on chronic congestive heart failure, unspecified heart failure type (HCC) [I50.9] Nausea and vomiting, unspecified vomiting type [R11.2]  Patient is known to our practice from previous admissions and is currently followed by [facility] nephrology.  Patient states he began having shortness of breath about a week prior to presentation.  States he has been compliant with all medications including diuretics.  Update Patient seen laying in bed Alert No complaints to offer  Creatinine 4.43 Urine output  Objective:  Vital signs in last 24 hours:  Temp:  [97.5 F (36.4 C)-98.4 F (36.9 C)] 97.9 F (36.6 C) (05/05 1111) Pulse Rate:  [84-85] 84 (05/05 1111) Resp:  [20] 20 (05/05 0400) BP: (107-158)/(71-112) 147/110 (05/05 1111) SpO2:  [93 %-98 %] 98 % (05/05 1111) Weight:  [127.5 kg] 127.5 kg (05/05 0500)  Weight change: 0.175 kg Filed Weights   06/26/23 0420 06/27/23 0530 06/28/23 0500  Weight: 121.4 kg 127.3 kg 127.5 kg    Intake/Output: I/O last 3 completed shifts: In: 2296.1 [P.O.:1560; I.V.:736.1] Out: 2075 [Urine:2075]   Intake/Output this shift:  Total I/O In: 10 [I.V.:10] Out: 600 [Urine:600]  Physical Exam: General: NAD  Head: Normocephalic, atraumatic. Moist oral mucosal membranes  Eyes: Anicteric  Lungs:  Clear to auscultation, normal effort  Heart:  Regular rate and rhythm  Abdomen:  Soft, nontender,   Extremities: No peripheral edema.  Neurologic: Alert and oriented, moving all four extremities  Skin: No lesions  Access: None    Basic Metabolic Panel: Recent Labs  Lab 06/23/23 0408 06/23/23 1302 06/24/23 0444 06/25/23 0520 06/26/23 0500 06/27/23 0530 06/28/23 0500  NA 136   < > 133* 130* 130* 132* 131*  K 3.6   < > 3.5 3.6 3.2* 3.5 3.5  CL 97*  --  91* 89* 88* 89* 91*  CO2 29  --  29 28 27 28 27   GLUCOSE 99  --  147* 129* 186* 147* 170*  BUN 44*  --  53* 58* 64* 69* 64*  CREATININE 3.88*   < > 4.28* 4.98* 4.83* 4.87* 4.43*  CALCIUM  8.9  --  8.9 8.8* 8.4* 8.8* 8.6*  MG 2.3  --   --  2.3 2.4 2.6* 2.7*  PHOS  --   --   --  4.9* 4.6 4.5 3.9   < > = values in this interval not displayed.    Liver Function Tests: Recent Labs  Lab 06/22/23 0431 06/25/23 0520 06/26/23 0500 06/27/23 0530 06/28/23 0500  AST 18 21 21 29  32  ALT 14 13 13 13 15   ALKPHOS 90 103 94 91 93  BILITOT 1.2 1.7* 1.3* 1.1 1.0  PROT 7.0 7.5 6.9 6.9 7.1  ALBUMIN 3.4* 3.7 3.5 3.4* 3.4*   No results for input(s): "LIPASE", "AMYLASE" in the last 168 hours.  No results for input(s): "AMMONIA" in the last 168 hours.  CBC: Recent Labs  Lab 06/23/23 1548 06/25/23 0520 06/26/23 0500 06/27/23 0530 06/28/23 0500  WBC 7.3  8.8 8.3 7.5 7.6  NEUTROABS  --  6.3 5.8 5.0 5.4  HGB 14.7 14.4 13.5 13.4 13.4  HCT 45.4 43.7 41.0 40.3 41.0  MCV 83.2 82.6 82.0 81.3 82.2  PLT 251 273 257 248 222    Cardiac Enzymes: No results for input(s): "CKTOTAL", "CKMB", "CKMBINDEX", "TROPONINI" in the last 168 hours.  BNP: Invalid input(s): "POCBNP"  CBG: Recent Labs  Lab 06/26/23 2048  GLUCAP 197*    Microbiology: Results for orders placed or performed during the hospital encounter of 06/21/23  Urine Culture     Status: None   Collection Time: 06/21/23  5:29 AM   Specimen: Urine, Clean Catch  Result Value Ref Range Status   Specimen Description   Final     URINE, CLEAN CATCH Performed at [facility], [address], [place], [place] [address]    Special Requests   Final    NONE Performed at [facility], [address], [place], [place] [address]    Culture   Final    NO GROWTH Performed at [facility], [address], [place], [place] [address]    Report Status 06/22/2023 FINAL  Final    Coagulation Studies: No results for input(s): "LABPROT", "INR" in the last 72 hours.  Urinalysis: No results for input(s): "COLORURINE", "LABSPEC", "PHURINE", "GLUCOSEU", "HGBUR", "BILIRUBINUR", "KETONESUR", "PROTEINUR", "UROBILINOGEN", "NITRITE", "LEUKOCYTESUR" in the last 72 hours.  Invalid input(s): "APPERANCEUR"    Imaging: No results found.    Medications:    milrinone  0.25 mcg/kg/min (06/28/23 0901)    amiodarone   200 mg Oral Daily   apixaban   10 mg Oral BID   Followed by   Cecily Cohen ON 07/02/2023] apixaban   5 mg Oral BID   atorvastatin   80 mg Oral Daily   Chlorhexidine  Gluconate Cloth  6 each Topical Daily   influenza vac split trivalent PF  0.5 mL Intramuscular Tomorrow-1000   insulin  aspart  0-9 Units Subcutaneous TID WC   lactulose  10 g Oral BID   levothyroxine   125 mcg Oral QAC breakfast   lipase/protease/amylase  36,000 Units Oral TID AC   sodium chloride  flush  10-40 mL Intracatheter Q12H   sodium chloride  flush  3 mL Intravenous Q12H   sodium chloride  flush  3 mL Intravenous Q12H   acetaminophen , morphine  injection, ondansetron  **OR** ondansetron  (ZOFRAN ) IV, ondansetron  (ZOFRAN ) IV, mouth rinse, sodium chloride  flush, sodium chloride  flush, sodium chloride  flush  Assessment/ Plan:  [name] is a 58 y.o.  male  with past medical conditions including hypertension, paroxysmal A-fib on Eliquis , CHF with a EF less than 20%, and chronic kidney disease stage IV.  Patient presents to the emergency department complaining of shortness of breath and swelling and has been admitted for  Generalized abdominal pain [R10.84] Hypertensive urgency [I16.0] Calculus of gallbladder without cholecystitis without obstruction [K80.20] Urinary tract infection without hematuria, site unspecified [N39.0] Intractable vomiting with nausea [R11.2] Acute on chronic congestive heart failure, unspecified heart failure type (HCC) [I50.9] Nausea and vomiting, unspecified vomiting type [R11.2]   Acute Kidney Injury on chronic kidney disease stage 4 with baseline creatinine 3.35 and GFR of 20 on 05/06/2023.  Follows with [facility] nephrology. Acute kidney injury secondary to hemodynamic instability and suspected cardiorenal syndrome Patient did undergo right heart cath however contrast is usually not utilized for this.    Creatinine showed some improvement today, urine output maintained as well. Will continue to monitor renal function for now. No acute indication for dialysis.  Lab Results  Component Value Date   CREATININE 4.43 (H) 06/28/2023   CREATININE 4.87 (H) 06/27/2023   CREATININE 4.83 (H) 06/26/2023    Intake/Output Summary (Last 24 hours) at 06/28/2023 1329 Last data filed at 06/28/2023 1245 Gross per 24 hour  Intake 1157.89 ml  Output 1750 ml  Net -592.11 ml    2.  Chronic systolic heart failure.  TEE echo completed on 01/19/2023 shows EF less than 20%.  Cardiology following and performed right heart cath on 4/30 which showed mixed pulmonary venous/arterial hypertension with low cardiac output and PCWP elevation.  Remains on milrinone . Amiodarone  drip transitioned to oral.  3. Anemia of chronic kidney disease Lab Results  Component Value Date   HGB 13.4 06/28/2023    Hemoglobin within optimal range.    LOS: 6 Lynx Goodrich 5/5/20251:29 PM

## 2023-06-28 NOTE — Progress Notes (Signed)
 PROGRESS NOTE    Hector [name]  NUU:[id number] DOB: 04-Apr-1965 DOA: 06/21/2023 PCP: Dionicia Frater, MD  No chief complaint on file.   Hospital Course:  Hector [name] is a 58 year old male with heart failure with reduced EF less than 20%, nonischemic cardiomyopathy, history of alcohol use disorder sober for 13 years, paroxysmal A-fib/flutter on Eliquis , OSA on CPAP, CKD stage IV, CVA, hypertension, hyperlipidemia, type 2 diabetes, presented to the hospital with abdominal pain, nausea, vomiting and increased lower extremity swelling with progressive dyspnea.  He was admitted for acute on chronic CHF exacerbation.  Subjective: Sleeping today.  He has no acute complaints to my arrival.  Denies any chest pain.  Reports he has still been urinating frequently.  Objective: Vitals:   06/28/23 0700 06/28/23 0800 06/28/23 0900 06/28/23 1111  BP: (!) 158/112 (!) 143/92 (!) 136/91 (!) 147/110  Pulse:    84  Resp:      Temp: (!) 97.5 F (36.4 C)   97.9 F (36.6 C)  TempSrc: Oral   Oral  SpO2: 93%   98%  Weight:      Height:        Intake/Output Summary (Last 24 hours) at 06/28/2023 1529 Last data filed at 06/28/2023 1245 Gross per 24 hour  Intake 1157.89 ml  Output 1750 ml  Net -592.11 ml   Filed Weights   06/26/23 0420 06/27/23 0530 06/28/23 0500  Weight: 121.4 kg 127.3 kg 127.5 kg    Examination: General exam: Appears calm and comfortable, NAD obese Respiratory system: No work of breathing, symmetric chest wall expansion Cardiovascular system: S1 & S2 heard, RRR.  Gastrointestinal system: Abdomen is nondistended, soft and nontender.  Neuro: Alert and oriented. No focal neurological deficits. Extremities: No lower extremity edema today Skin: No rashes, lesions Psychiatry: Demonstrates appropriate judgement and insight. Mood & affect appropriate for situation.   Assessment & Plan:  Principal Problem:   Acute on chronic HFrEF (heart failure with reduced ejection  fraction) (HCC) Active Problems:   Nausea and vomiting   Chronic renal impairment, stage 4 (severe) (HCC)   OSA (obstructive sleep apnea)   Type II diabetes mellitus with renal manifestations (HCC)   Hyperbilirubinemia   Essential hypertension   Alcohol use, unspecified, in remission   Gout   Paroxysmal atrial flutter (HCC)   Alcoholic cirrhosis of liver without ascites (HCC)   Intractable vomiting with nausea   NSTEMI (non-ST elevated myocardial infarction) (HCC)   Acute on chronic systolic CHF Nonischemic cardiomyopathy - BNP 756 on arrival - TEE November 2024: EF less than 20% - RHC 4/30: PCWP elevated, mixed pulmonary venous/pulmonary arterial hypertension.  Low cardiac output. - Currently on milrinone , titration per cardiology. - For IV amiodarone  for A-fib RVR, has now converted back to sinus.  Back on p.o. Amio now - Cardiorenal syndrome is tenuous.  Renal function is rebounding some today - Would benefit from ongoing diuresis though this is difficult given his kidney function. - Continue to monitor on telemetry, medication titration as needed. - Home meds include high-dose Lasix , isosorbide , hydralazine , Jardiance .   - GDMT limited due to low blood pressures and worsening kidney function.  BiDil  held, Jardiance  held.  Unable to start beta-blocker due to low output heart failure. - Reportedly he is not a candidate for ICD given poor compliance with medications and CKD stage IV  Left lower extremity DVT - Currently on loading dose Eliquis  - Will need indefinite Eliquis  at discharge.  Was meant to be on this  medication for A-fib but was not taking consistently. - Have discussed extensively with the patient - Pharmacy has confirmed this medication will be affordable outpatient   Elevated troponins - Likely secondary to chronic myocardial injury, has been chronically elevated in the past - No STEMI - Cardiology following as above  Hypoxia - Secondary to volume  overload. - Diuresis as tolerated - Wean oxygen as tolerated  Nausea, vomiting, abdominal pain - Resolved.  Paroxysmal A-fib - DCCV November 2024 - Atypical flutter with rates into the 130s on 5/3.  Initiated amiodarone  drip.  Better controlled now - Back on p.o. amiodarone  per cardiology - Continue to monitor and replete electrolytes daily.  Trend CMP - Continue to monitor on telemetry.  CKD stage IV - Creatinine baseline near 3.4-3.8. - Creatinine continues to worsen secondary to cardiorenal syndrome - Continue to follow urinary output.  Remained stable - Inability to truly diurese patient given kidney function.  He may be nearing the need for dialysis though at this time does not appear urgent - Hep B surface antigen nonreactive - Nephrology has been consulted, appreciate their recommendations - Avoid additional nephrotoxic meds if able  Type 2 diabetes - Jardiance  has been held given worsening CKD as above. - He is refusing CBGs and sliding scale insulin  citing that he is not diabetic despite education - hgb A1c 7.3% - Continue to monitor blood glucose on daily CMPs, remains elevated but not above 200 - Continue to encourage CBGs as pt will allow  History of CVA Hypertension Hyperlipidemia - Continue statin as above  OSA - CPAP at night  Liver cirrhosis Remote history of alcohol use disorder - CMP stable.  BMI 37 Obesity - Outpatient follow up for lifestyle modification and risk factor management  DVT prophylaxis: Loading dose Eliquis    Code Status: Full Code Disposition:  Inpatient. Decompensated heart failure.  On milrinone , possible dialysis this admission.  Cardiology following and managing  Consultants:  Treatment Team:  Consulting Physician: Lateef, Munsoor, MD Consulting Physician: Wenona Hamilton, MD  Procedures:  RHC 4/30  Antimicrobials:  Anti-infectives (From admission, onward)    Start     Dose/Rate Route Frequency Ordered Stop    06/21/23 0615  cefTRIAXone  (ROCEPHIN ) 1 g in sodium chloride  0.9 % 100 mL IVPB        1 g 200 mL/hr over 30 Minutes Intravenous  Once 06/21/23 0609 06/21/23 0654       Data Reviewed: I have personally reviewed following labs and imaging studies CBC: Recent Labs  Lab 06/23/23 1548 06/25/23 0520 06/26/23 0500 06/27/23 0530 06/28/23 0500  WBC 7.3 8.8 8.3 7.5 7.6  NEUTROABS  --  6.3 5.8 5.0 5.4  HGB 14.7 14.4 13.5 13.4 13.4  HCT 45.4 43.7 41.0 40.3 41.0  MCV 83.2 82.6 82.0 81.3 82.2  PLT 251 273 257 248 222   Basic Metabolic Panel: Recent Labs  Lab 06/23/23 0408 06/23/23 1302 06/24/23 0444 06/25/23 0520 06/26/23 0500 06/27/23 0530 06/28/23 0500  NA 136   < > 133* 130* 130* 132* 131*  K 3.6   < > 3.5 3.6 3.2* 3.5 3.5  CL 97*  --  91* 89* 88* 89* 91*  CO2 29  --  29 28 27 28 27   GLUCOSE 99  --  147* 129* 186* 147* 170*  BUN 44*  --  53* 58* 64* 69* 64*  CREATININE 3.88*   < > 4.28* 4.98* 4.83* 4.87* 4.43*  CALCIUM  8.9  --  8.9 8.8* 8.4*  8.8* 8.6*  MG 2.3  --   --  2.3 2.4 2.6* 2.7*  PHOS  --   --   --  4.9* 4.6 4.5 3.9   < > = values in this interval not displayed.   GFR: Estimated Creatinine Clearance: 24.7 mL/min (A) (by C-G formula based on SCr of 4.43 mg/dL (H)). Liver Function Tests: Recent Labs  Lab 06/22/23 0431 06/25/23 0520 06/26/23 0500 06/27/23 0530 06/28/23 0500  AST 18 21 21 29  32  ALT 14 13 13 13 15   ALKPHOS 90 103 94 91 93  BILITOT 1.2 1.7* 1.3* 1.1 1.0  PROT 7.0 7.5 6.9 6.9 7.1  ALBUMIN 3.4* 3.7 3.5 3.4* 3.4*   CBG: Recent Labs  Lab 06/26/23 2048  GLUCAP 197*    Recent Results (from the past 240 hours)  Urine Culture     Status: None   Collection Time: 06/21/23  5:29 AM   Specimen: Urine, Clean Catch  Result Value Ref Range Status   Specimen Description   Final    URINE, CLEAN CATCH Performed at [facility], [address] [place], [place] [address]    Special Requests   Final    NONE Performed at [facility], [address] [place], [place] [address]    Culture   Final    NO GROWTH Performed at [facility], [address] [place], [place] [address]    Report Status 06/22/2023 FINAL  Final     Radiology Studies: No results found.   Scheduled Meds:  amiodarone   200 mg Oral Daily   apixaban   10 mg Oral BID   Followed by   Cecily Cohen ON 07/02/2023] apixaban   5 mg Oral BID   atorvastatin   80 mg Oral Daily   Chlorhexidine  Gluconate Cloth  6 each Topical Daily   influenza vac split trivalent PF  0.5 mL Intramuscular Tomorrow-1000   insulin  aspart  0-9 Units Subcutaneous TID WC   lactulose  10 g Oral BID   levothyroxine   125 mcg Oral QAC breakfast   lipase/protease/amylase  36,000 Units Oral TID AC   sodium chloride  flush  10-40 mL Intracatheter Q12H   sodium chloride  flush  3 mL Intravenous Q12H   sodium chloride  flush  3 mL Intravenous Q12H   Continuous Infusions:  milrinone  0.25 mcg/kg/min (06/28/23 0901)     LOS: 6 days  MDM: Patient is high risk for one or more organ failure.  They necessitate ongoing hospitalization for continued IV therapies and subsequent lab monitoring. Total time spent interpreting labs and vitals, reviewing the medical record, coordinating care amongst consultants and care team members, directly assessing and discussing care with the patient and/or family: 55 min  Hector Mcpartlin, DO [facility]  To contact the attending physician between 7A-7P please use Epic Chat. To contact the covering physician during after hours 7P-7A, please review Amion.  06/28/2023, 3:29 PM   *This document has been created with the assistance of dictation software. Please excuse typographical errors. *

## 2023-06-28 NOTE — Progress Notes (Addendum)
 [name], male   DOB: May 31, 1965, 58 y.o.   MRN: [id number]     Advanced Heart Failure Rounding Note  Cardiologist: Belva Boyden, MD  Chief Complaint: CHF Subjective:   I/Os net + 206, weight up 1lb.  Creatinine staying in the 4 range. Nephrology now following.   Co-ox 67% on milrinone  0.25 mcg/kg/min.   Back into flutter 5/3. Chemically converted with amiodarone  gtt. Now on PO amio and maintaining NSR.  RHC Procedural Findings: Hemodynamics (mmHg), RA mean 8, RV 48/11, PA 55/39, mean 39, PCWP mean 21. Oxygen saturations:, PA 57%, AO 93%, Fick CO 4.01/1.68. PVR 4.5 WU, PAPi 2   Feels ok this morning. Has been walking around his room some and sitting in the recliner.  Objective:   Venous dopplers showed DVT on left.   Weight Range: 127.5 kg Body mass index is 39.2 kg/m.   Vital Signs:   Temp:  [97.5 F (36.4 C)-98.4 F (36.9 C)] 97.5 F (36.4 C) (05/05 0700) Pulse Rate:  [80-85] 85 (05/04 1758) Resp:  [20] 20 (05/05 0400) BP: (100-146)/(67-106) 129/93 (05/05 0500) SpO2:  [93 %-95 %] 93 % (05/05 0700) Weight:  [127.5 kg] 127.5 kg (05/05 0500) Last BM Date : 06/21/23  Weight change: Filed Weights   06/26/23 0420 06/27/23 0530 06/28/23 0500  Weight: 121.4 kg 127.3 kg 127.5 kg   Intake/Output:   Intake/Output Summary (Last 24 hours) at 06/28/2023 0747 Last data filed at 06/28/2023 0700 Gross per 24 hour  Intake 1356.5 ml  Output 1150 ml  Net 206.5 ml    Physical Exam    General:  well appearing.  No respiratory difficulty Neck: supple. JVD ~7 cm.  Cor: PMI nondisplaced. Regular rate & rhythm. No rubs, gallops or murmurs. Lungs: clear Extremities: no cyanosis, clubbing, rash, edema. PICC LUE.  Neuro: alert & oriented x 3. Moves all 4 extremities w/o difficulty. Affect pleasant.   Telemetry   NSR 80s (personally reviewed).   Labs    CBC Recent Labs    06/27/23 0530 06/28/23 0500  WBC 7.5 7.6  NEUTROABS 5.0 5.4  HGB 13.4 13.4  HCT  40.3 41.0  MCV 81.3 82.2  PLT 248 222   Basic Metabolic Panel Recent Labs    78/29/56 0530 06/28/23 0500  NA 132* 131*  K 3.5 3.5  CL 89* 91*  CO2 28 27  GLUCOSE 147* 170*  BUN 69* 64*  CREATININE 4.87* 4.43*  CALCIUM  8.8* 8.6*  MG 2.6* 2.7*  PHOS 4.5 3.9   Liver Function Tests Recent Labs    06/27/23 0530 06/28/23 0500  AST 29 32  ALT 13 15  ALKPHOS 91 93  BILITOT 1.1 1.0  PROT 6.9 7.1  ALBUMIN 3.4* 3.4*   No results for input(s): "LIPASE", "AMYLASE" in the last 72 hours.  Cardiac Enzymes No results for input(s): "CKTOTAL", "CKMB", "CKMBINDEX", "TROPONINI" in the last 72 hours.  BNP: BNP (last 3 results) Recent Labs    09/25/22 2248 01/16/23 1133 06/21/23 0250  BNP 284.0* 358.6* 756.8*    ProBNP (last 3 results) No results for input(s): "PROBNP" in the last 8760 hours.   D-Dimer No results for input(s): "DDIMER" in the last 72 hours. Hemoglobin A1C No results for input(s): "HGBA1C" in the last 72 hours. Fasting Lipid Panel No results for input(s): "CHOL", "HDL", "LDLCALC", "TRIG", "CHOLHDL", "LDLDIRECT" in the last 72 hours. Thyroid  Function Tests No results for input(s): "TSH", "T4TOTAL", "T3FREE", "THYROIDAB" in the last 72 hours.  Invalid input(s): "  FREET3"  Other results:   Imaging   No results found.   Medications:    Scheduled Medications:  amiodarone   200 mg Oral Daily   apixaban   10 mg Oral BID   Followed by   Cecily Cohen ON 07/02/2023] apixaban   5 mg Oral BID   atorvastatin   80 mg Oral Daily   Chlorhexidine  Gluconate Cloth  6 each Topical Daily   influenza vac split trivalent PF  0.5 mL Intramuscular Tomorrow-1000   insulin  aspart  0-9 Units Subcutaneous TID WC   lactulose  10 g Oral BID   levothyroxine   125 mcg Oral QAC breakfast   lipase/protease/amylase  36,000 Units Oral TID AC   lipase/protease/amylase  36,000 Units Oral Q breakfast   sodium chloride  flush  10-40 mL Intracatheter Q12H   sodium chloride  flush  3 mL  Intravenous Q12H   sodium chloride  flush  3 mL Intravenous Q12H    Infusions:  milrinone  0.25 mcg/kg/min (06/28/23 0700)    PRN Medications: acetaminophen , morphine  injection, ondansetron  **OR** ondansetron  (ZOFRAN ) IV, ondansetron  (ZOFRAN ) IV, mouth rinse, sodium chloride  flush, sodium chloride  flush, sodium chloride  flush  Assessment/Plan   1. Acute on chronic systolic CHF: Nonischemic cardiomyopathy, cath in 2016 with minimal CAD.  Cardiomyopathy since 2009.  TEE in 11/24 showed EF < 20%, severe RV dysfunction, mild MR.  Mildly elevated HS-TnI this admission with no trend likely signifies demand ischemia.   RHC 06/23/23 with mildly elevated filling pressures and low CI 1.68.  Patient was started on milrinone  0.25 and diuresis was continued.  He now looks euvolemic but creatinine staying in the 4 range.  Good co-ox 67%. - He is off Lasix . Nephrology has been consulted with plans for possible RRT - Off Bidil  for now to try to keep BP higher.  - Decrease milrinone  0.25> 0.125.  - Hold Jardiance , restart when creatinine trending down.  - Hold off on beta blocker with low output HF.   - End stage HF, low output on RHC In 3/24, CKD stage IV, and poor compliance with medication regimen and followup.  Therefore, not candidate for advanced therapies and also deemed not candidate for ICD.  2. Atrial arrhythmias: H/o paroxysmal AF. Atypical atrial flutter in 11/24 with DCCV.  - went into atrial flutter 5/3. Chemically converted with amiodarone  gtt. Back on amiodarone  200 mg daily.  - Continue apixaban .    3. Abdominal pain: Resolved.  Abdominal imaging unremarkable.  Suspect symptoms related to CHF.  4.CKD stage IV: Creatinine 3.88 => 4.28 => 4.98> 4.87>4.43 with diuresis. - Off Lasix .  - Off Bidil  and Jardiance  as above.  - Milrinone  as above   5. OSA: CPAP 6. HTN: Avoid hypotension.  7. DVT: Patient has a DVT on the left.  He was supposed to be on apixaban  at home but had not refilled since  November, was probably not taking.  - He has been restarted on apixaban  in the hospital.     Length of Stay: 6  Sheryl Donna, NP  06/28/2023, 7:47 AM  Advanced Heart Failure Team Pager [phone] (M-F; 7a - 5p)  Please contact [facility] for night-coverage after hours (5p -7a ) and weekends on [email or web address]

## 2023-06-29 DIAGNOSIS — N184 Chronic kidney disease, stage 4 (severe): Secondary | ICD-10-CM | POA: Diagnosis not present

## 2023-06-29 DIAGNOSIS — R570 Cardiogenic shock: Secondary | ICD-10-CM | POA: Diagnosis not present

## 2023-06-29 DIAGNOSIS — I5023 Acute on chronic systolic (congestive) heart failure: Secondary | ICD-10-CM | POA: Diagnosis not present

## 2023-06-29 DIAGNOSIS — I130 Hypertensive heart and chronic kidney disease with heart failure and stage 1 through stage 4 chronic kidney disease, or unspecified chronic kidney disease: Secondary | ICD-10-CM | POA: Diagnosis not present

## 2023-06-29 LAB — COMPREHENSIVE METABOLIC PANEL WITH GFR
ALT: 15 U/L (ref 0–44)
AST: 31 U/L (ref 15–41)
Albumin: 3.4 g/dL — ABNORMAL LOW (ref 3.5–5.0)
Alkaline Phosphatase: 93 U/L (ref 38–126)
Anion gap: 10 (ref 5–15)
BUN: 61 mg/dL — ABNORMAL HIGH (ref 6–20)
CO2: 28 mmol/L (ref 22–32)
Calcium: 8.7 mg/dL — ABNORMAL LOW (ref 8.9–10.3)
Chloride: 93 mmol/L — ABNORMAL LOW (ref 98–111)
Creatinine, Ser: 3.98 mg/dL — ABNORMAL HIGH (ref 0.61–1.24)
GFR, Estimated: 17 mL/min — ABNORMAL LOW (ref >60)
Glucose, Bld: 125 mg/dL — ABNORMAL HIGH (ref 70–99)
Potassium: 3.7 mmol/L (ref 3.5–5.1)
Sodium: 131 mmol/L — ABNORMAL LOW (ref 135–145)
Total Bilirubin: 1 mg/dL (ref 0.0–1.2)
Total Protein: 7.1 g/dL (ref 6.5–8.1)

## 2023-06-29 LAB — CBC WITH DIFFERENTIAL/PLATELET
Abs Immature Granulocytes: 0.04 10*3/uL (ref 0.00–0.07)
Basophils Absolute: 0 10*3/uL (ref 0.0–0.1)
Basophils Relative: 1 %
Eosinophils Absolute: 0.3 10*3/uL (ref 0.0–0.5)
Eosinophils Relative: 4 %
HCT: 42.1 % (ref 39.0–52.0)
Hemoglobin: 13.6 g/dL (ref 13.0–17.0)
Immature Granulocytes: 1 %
Lymphocytes Relative: 13 %
Lymphs Abs: 1.1 10*3/uL (ref 0.7–4.0)
MCH: 26.5 pg (ref 26.0–34.0)
MCHC: 32.3 g/dL (ref 30.0–36.0)
MCV: 81.9 fL (ref 80.0–100.0)
Monocytes Absolute: 0.9 10*3/uL (ref 0.1–1.0)
Monocytes Relative: 11 %
Neutro Abs: 5.9 10*3/uL (ref 1.7–7.7)
Neutrophils Relative %: 70 %
Platelets: 258 10*3/uL (ref 150–400)
RBC: 5.14 MIL/uL (ref 4.22–5.81)
RDW: 16.1 % — ABNORMAL HIGH (ref 11.5–15.5)
WBC: 8.2 10*3/uL (ref 4.0–10.5)
nRBC: 0 % (ref 0.0–0.2)

## 2023-06-29 LAB — PHOSPHORUS: Phosphorus: 3.8 mg/dL (ref 2.5–4.6)

## 2023-06-29 LAB — COOXEMETRY PANEL
Carboxyhemoglobin: 1.1 % (ref 0.5–1.5)
Carboxyhemoglobin: 1.6 % — ABNORMAL HIGH (ref 0.5–1.5)
Methemoglobin: <0.7 % (ref 0.0–1.5)
Methemoglobin: <0.7 % (ref 0.0–1.5)
O2 Saturation: 50.1 %
O2 Saturation: 61.7 %
Total hemoglobin: 14.3 g/dL (ref 12.0–16.0)
Total hemoglobin: 14.4 g/dL (ref 12.0–16.0)
Total oxygen content: 49.4 %
Total oxygen content: 60.4 %

## 2023-06-29 LAB — MAGNESIUM: Magnesium: 2.7 mg/dL — ABNORMAL HIGH (ref 1.7–2.4)

## 2023-06-29 LAB — HEPATITIS B SURFACE ANTIBODY, QUANTITATIVE: Hep B S AB Quant (Post): <3.5 m[IU]/mL — ABNORMAL LOW

## 2023-06-29 MED ORDER — ISOSORB DINITRATE-HYDRALAZINE 20-37.5 MG PO TABS
0.5000 | ORAL_TABLET | Freq: Three times a day (TID) | ORAL | Status: DC
  Administered 2023-06-29 (×3): 0.5 via ORAL
  Filled 2023-06-29 (×4): qty 0.5

## 2023-06-29 MED ORDER — MILRINONE LACTATE IN DEXTROSE 20-5 MG/100ML-% IV SOLN
0.1250 ug/kg/min | INTRAVENOUS | Status: DC
  Administered 2023-06-29: 0.125 ug/kg/min via INTRAVENOUS
  Filled 2023-06-29: qty 100

## 2023-06-29 NOTE — Progress Notes (Signed)
 [facility]  ROUNDING NOTE   Subjective:   Hector [name]  is a 58 y.o. male with past medical conditions including hypertension, paroxysmal A-fib on Eliquis , CHF with a EF less than 20%, and chronic kidney disease stage IV.  Patient presents to the emergency department complaining of shortness of breath and swelling and has been admitted for Generalized abdominal pain [R10.84] Hypertensive urgency [I16.0] Calculus of gallbladder without cholecystitis without obstruction [K80.20] Urinary tract infection without hematuria, site unspecified [N39.0] Intractable vomiting with nausea [R11.2] Acute on chronic congestive heart failure, unspecified heart failure type (HCC) [I50.9] Nausea and vomiting, unspecified vomiting type [R11.2]  Patient is known to our practice from previous admissions and is currently followed by [facility] nephrology.  Patient states he began having shortness of breath about a week prior to presentation.  States he has been compliant with all medications including diuretics.  Update Patient seen sitting at side of bed Alert and oriented Still has nonproductive cough  Creatinine 3.98 Urine output  Objective:  Vital signs in last 24 hours:  Temp:  [97.7 F (36.5 C)-99.1 F (37.3 C)] 98.4 F (36.9 C) (05/06 1500) Pulse Rate:  [73-83] 73 (05/06 1500) Resp:  [16-18] 16 (05/06 0335) BP: (134-155)/(84-100) 155/84 (05/06 0700) SpO2:  [93 %-95 %] 93 % (05/06 1500) Weight:  [126.9 kg] 126.9 kg (05/06 0500)  Weight change: -0.6 kg Filed Weights   06/27/23 0530 06/28/23 0500 06/29/23 0500  Weight: 127.3 kg 127.5 kg 126.9 kg    Intake/Output: I/O last 3 completed shifts: In: 1475.3 [P.O.:1200; I.V.:275.3] Out: 3400 [Urine:3400]   Intake/Output this shift:  Total I/O In: 360 [P.O.:360] Out: 350 [Urine:350]  Physical Exam: General: NAD  Head: Normocephalic, atraumatic. Moist oral mucosal membranes  Eyes: Anicteric  Lungs:  Clear to  auscultation, normal effort, cough  Heart: Regular rate and rhythm  Abdomen:  Soft, nontender,   Extremities: No peripheral edema.  Neurologic: Alert and oriented, moving all four extremities  Skin: No lesions  Access: None    Basic Metabolic Panel: Recent Labs  Lab 06/25/23 0520 06/26/23 0500 06/27/23 0530 06/28/23 0500 06/29/23 0534  NA 130* 130* 132* 131* 131*  K 3.6 3.2* 3.5 3.5 3.7  CL 89* 88* 89* 91* 93*  CO2 28 27 28 27 28   GLUCOSE 129* 186* 147* 170* 125*  BUN 58* 64* 69* 64* 61*  CREATININE 4.98* 4.83* 4.87* 4.43* 3.98*  CALCIUM  8.8* 8.4* 8.8* 8.6* 8.7*  MG 2.3 2.4 2.6* 2.7* 2.7*  PHOS 4.9* 4.6 4.5 3.9 3.8    Liver Function Tests: Recent Labs  Lab 06/25/23 0520 06/26/23 0500 06/27/23 0530 06/28/23 0500 06/29/23 0534  AST 21 21 29  32 31  ALT 13 13 13 15 15   ALKPHOS 103 94 91 93 93  BILITOT 1.7* 1.3* 1.1 1.0 1.0  PROT 7.5 6.9 6.9 7.1 7.1  ALBUMIN 3.7 3.5 3.4* 3.4* 3.4*   No results for input(s): "LIPASE", "AMYLASE" in the last 168 hours.  No results for input(s): "AMMONIA" in the last 168 hours.  CBC: Recent Labs  Lab 06/25/23 0520 06/26/23 0500 06/27/23 0530 06/28/23 0500 06/29/23 0534  WBC 8.8 8.3 7.5 7.6 8.2  NEUTROABS 6.3 5.8 5.0 5.4 5.9  HGB 14.4 13.5 13.4 13.4 13.6  HCT 43.7 41.0 40.3 41.0 42.1  MCV 82.6 82.0 81.3 82.2 81.9  PLT 273 257 248 222 258    Cardiac Enzymes: No results for input(s): "CKTOTAL", "CKMB", "CKMBINDEX", "TROPONINI" in the last 168 hours.  BNP:  Invalid input(s): "POCBNP"  CBG: Recent Labs  Lab 06/26/23 2048  GLUCAP 197*    Microbiology: Results for orders placed or performed during the hospital encounter of 06/21/23  Urine Culture     Status: None   Collection Time: 06/21/23  5:29 AM   Specimen: Urine, Clean Catch  Result Value Ref Range Status   Specimen Description   Final    URINE, CLEAN CATCH Performed at [facility], [address] [place], [place] [address]    Special Requests    Final    NONE Performed at [facility], [address], [place], [place] [address]    Culture   Final    NO GROWTH Performed at [facility], [address] [place], [place] [address]    Report Status 06/22/2023 FINAL  Final    Coagulation Studies: No results for input(s): "LABPROT", "INR" in the last 72 hours.  Urinalysis: No results for input(s): "COLORURINE", "LABSPEC", "PHURINE", "GLUCOSEU", "HGBUR", "BILIRUBINUR", "KETONESUR", "PROTEINUR", "UROBILINOGEN", "NITRITE", "LEUKOCYTESUR" in the last 72 hours.  Invalid input(s): "APPERANCEUR"    Imaging: No results found.    Medications:      amiodarone   200 mg Oral Daily   apixaban   10 mg Oral BID   Followed by   Cecily Cohen ON 07/02/2023] apixaban   5 mg Oral BID   atorvastatin   80 mg Oral Daily   Chlorhexidine  Gluconate Cloth  6 each Topical Daily   influenza vac split trivalent PF  0.5 mL Intramuscular Tomorrow-1000   insulin  aspart  0-9 Units Subcutaneous TID WC   isosorbide -hydrALAZINE   0.5 tablet Oral TID   lactulose  10 g Oral BID   levothyroxine   125 mcg Oral QAC breakfast   lipase/protease/amylase  36,000 Units Oral TID AC   sodium chloride  flush  10-40 mL Intracatheter Q12H   sodium chloride  flush  3 mL Intravenous Q12H   sodium chloride  flush  3 mL Intravenous Q12H   acetaminophen , morphine  injection, ondansetron  **OR** ondansetron  (ZOFRAN ) IV, ondansetron  (ZOFRAN ) IV, mouth rinse, sodium chloride  flush, sodium chloride  flush, sodium chloride  flush  Assessment/ Plan:  [name] is a 58 y.o.  male  with past medical conditions including hypertension, paroxysmal A-fib on Eliquis , CHF with a EF less than 20%, and chronic kidney disease stage IV.  Patient presents to the emergency department complaining of shortness of breath and swelling and has been admitted for Generalized abdominal pain [R10.84] Hypertensive urgency [I16.0] Calculus of gallbladder without cholecystitis without  obstruction [K80.20] Urinary tract infection without hematuria, site unspecified [N39.0] Intractable vomiting with nausea [R11.2] Acute on chronic congestive heart failure, unspecified heart failure type (HCC) [I50.9] Nausea and vomiting, unspecified vomiting type [R11.2]   Acute Kidney Injury on chronic kidney disease stage 4 with baseline creatinine 3.35 and GFR of 20 on 05/06/2023.  Follows with [facility] nephrology. Acute kidney injury secondary to hemodynamic instability and suspected cardiorenal syndrome Patient did undergo right heart cath however contrast is usually not utilized for this.    Creatinine continues to improve with adequate urine output noted.  Will defer renal replacement therapy at this time to continue to monitor recovery.  Lab Results  Component Value Date   CREATININE 3.98 (H) 06/29/2023   CREATININE 4.43 (H) 06/28/2023   CREATININE 4.87 (H) 06/27/2023    Intake/Output Summary (Last 24 hours) at 06/29/2023 1806 Last data filed at 06/29/2023 1500 Gross per 24 hour  Intake 640.8 ml  Output 1350 ml  Net -709.2 ml  2.  Chronic systolic heart failure.  TEE echo completed on 01/19/2023 shows EF less than 20%.  Cardiology following and performed right heart cath on 4/30 which showed mixed pulmonary venous/arterial hypertension with low cardiac output and PCWP elevation.  Remains on decreased milrinone  and oral amiodarone   3. Anemia of chronic kidney disease Lab Results  Component Value Date   HGB 13.6 06/29/2023    Hemoglobin within optimal range.    LOS: 7 Aybree Lanyon 5/6/20256:06 PM

## 2023-06-29 NOTE — Progress Notes (Signed)
 PROGRESS NOTE    Hector [name]  ZOX:[id number] DOB: 24-Aug-1965 DOA: 06/21/2023 PCP: Dionicia Frater, MD  No chief complaint on file.   Hospital Course:  Hector [name] is a 58 year old male with heart failure with reduced EF less than 20%, nonischemic cardiomyopathy, history of alcohol use disorder sober for 13 years, paroxysmal A-fib/flutter on Eliquis , OSA on CPAP, CKD stage IV, CVA, hypertension, hyperlipidemia, type 2 diabetes, presented to the hospital with abdominal pain, nausea, vomiting and increased lower extremity swelling with progressive dyspnea.  He was admitted for acute on chronic CHF exacerbation.  Subjective: No acute events overnight.  Patient is sleeping on arrival.  He is resistant to ambulation.  Requires significant prompting to answer questions.  He reports he does not feel like talking or walking today  Objective: Vitals:   06/29/23 0500 06/29/23 0700 06/29/23 1100 06/29/23 1500  BP:  (!) 155/84    Pulse:  77 83 73  Resp:      Temp:  98.9 F (37.2 C) 97.7 F (36.5 C) 98.4 F (36.9 C)  TempSrc:  Oral Oral Oral  SpO2:   95% 93%  Weight: 126.9 kg     Height:        Intake/Output Summary (Last 24 hours) at 06/29/2023 1630 Last data filed at 06/29/2023 1500 Gross per 24 hour  Intake 737.4 ml  Output 2000 ml  Net -1262.6 ml   Filed Weights   06/27/23 0530 06/28/23 0500 06/29/23 0500  Weight: 127.3 kg 127.5 kg 126.9 kg    Examination: General exam: Appears calm and comfortable, NAD obese Respiratory system: No work of breathing, symmetric chest wall expansion Cardiovascular system: S1 & S2 heard, RRR.  Gastrointestinal system: Abdomen is nondistended, soft and nontender.  Neuro: Alert and oriented. No focal neurological deficits. Extremities: No lower extremity edema today Skin: No rashes, lesions Psychiatry: Demonstrates appropriate judgement and insight. Mood & affect appropriate for situation.   Assessment & Plan:  Principal  Problem:   Acute on chronic HFrEF (heart failure with reduced ejection fraction) (HCC) Active Problems:   Nausea and vomiting   Chronic renal impairment, stage 4 (severe) (HCC)   OSA (obstructive sleep apnea)   Type II diabetes mellitus with renal manifestations (HCC)   Hyperbilirubinemia   Essential hypertension   Alcohol use, unspecified, in remission   Gout   Paroxysmal atrial flutter (HCC)   Alcoholic cirrhosis of liver without ascites (HCC)   Intractable vomiting with nausea   NSTEMI (non-ST elevated myocardial infarction) (HCC)   Acute on chronic systolic CHF Nonischemic cardiomyopathy - BNP 756 on arrival - TEE November 2024: EF less than 20% - RHC 4/30: PCWP elevated, mixed pulmonary venous/pulmonary arterial hypertension.  Low cardiac output. - Has been on milrinone , discontinued today - Required IV amiodarone  for A-fib RVR, now back to sinus.  On p.o. amiodarone  - Cardiorenal syndrome complicating care.  Renal function has rebounded some but remains tenuous - Patient will have ongoing diuresis needs though this is difficult given his kidney function - Continue monitor on telemetry, and medication titration per cardiology and nephrology as well.  Appreciate assistance - Home meds previously included Lasix , isosorbide , hydralazine , Jardiance .  Any of these have been held given low blood pressures and worsening kidney function. - Not a candidate for beta-blocker due to low output heart failure - Currently not a candidate for ICD given poor compliance with medications and CKD stage IV  Medication nonadherence - Patient demonstrates little interest in his medical care.  Previously nonadherent to outpatient medications - Resistant to ambulation with physical therapy. - Resistant to discussing care management during daily rounds - Unfortunately anticipate patient will return to the hospital after discharge.  Have discussed this with him as well.  Left lower extremity DVT -  Currently on loading dose Eliquis  - Will need indefinite Eliquis  at discharge.  Was meant to be on this medication for A-fib but was not taking consistently. - Have discussed extensively with the patient - Pharmacy has confirmed this medication will be affordable outpatient   Significant deconditioning - Secondary to multiple chronic diseases as above - PT eval ordered and pending  Elevated troponins - Likely secondary to chronic myocardial injury, has been chronically elevated in the past - No STEMI - Cardiology following as above  Hypoxia - Secondary to volume overload. - Diuresis as tolerated - Wean oxygen as tolerated  Nausea, vomiting, abdominal pain - Resolved.  Paroxysmal A-fib - DCCV November 2024 - Atypical flutter with rates into the 130s on 5/3.  Initiated amiodarone  drip.  Better controlled now - Back on p.o. amiodarone  per cardiology - Continue to monitor and replete electrolytes daily.  Trend CMP - Continue to monitor on telemetry.  CKD stage IV - Creatinine baseline near 3.4-3.8. - Complicated by cardiorenal syndrome - Continue to follow urinary output, remains stable - Inability to truly diurese patient given kidney function - Anticipate patient will need dialysis within the next 6 months but creatinine appears to be rebounding now - Hep B surface antigen nonreactive - Nephrology has been consulted, appreciate their recommendations - Avoid additional nephrotoxic meds if able  Type 2 diabetes - Jardiance  has been held given worsening CKD as above. - He is refusing CBGs and sliding scale insulin  citing that he is not diabetic despite education - hgb A1c 7.3% - Continue to monitor blood glucose on daily CMP's, and remains elevated but not above 200. - Continue to encourage CBGs as patient will allow  History of CVA Hypertension Hyperlipidemia - Continue statin as above  OSA - CPAP at night  Liver cirrhosis Remote history of alcohol use disorder -  CMP stable.  BMI 37 Obesity - Outpatient follow up for lifestyle modification and risk factor management  DVT prophylaxis: Loading dose Eliquis    Code Status: Full Code Disposition:  Inpatient. Decompensated heart failure.  Cardiology and nephrology following. Consultants:  Treatment Team:  Consulting Physician: Lateef, Munsoor, MD Consulting Physician: Wenona Hamilton, MD  Procedures:  RHC 4/30  Antimicrobials:  Anti-infectives (From admission, onward)    Start     Dose/Rate Route Frequency Ordered Stop   06/21/23 0615  cefTRIAXone  (ROCEPHIN ) 1 g in sodium chloride  0.9 % 100 mL IVPB        1 g 200 mL/hr over 30 Minutes Intravenous  Once 06/21/23 0609 06/21/23 0654       Data Reviewed: I have personally reviewed following labs and imaging studies CBC: Recent Labs  Lab 06/25/23 0520 06/26/23 0500 06/27/23 0530 06/28/23 0500 06/29/23 0534  WBC 8.8 8.3 7.5 7.6 8.2  NEUTROABS 6.3 5.8 5.0 5.4 5.9  HGB 14.4 13.5 13.4 13.4 13.6  HCT 43.7 41.0 40.3 41.0 42.1  MCV 82.6 82.0 81.3 82.2 81.9  PLT 273 257 248 222 258   Basic Metabolic Panel: Recent Labs  Lab 06/25/23 0520 06/26/23 0500 06/27/23 0530 06/28/23 0500 06/29/23 0534  NA 130* 130* 132* 131* 131*  K 3.6 3.2* 3.5 3.5 3.7  CL 89* 88* 89* 91* 93*  CO2 28  27 28 27 28   GLUCOSE 129* 186* 147* 170* 125*  BUN 58* 64* 69* 64* 61*  CREATININE 4.98* 4.83* 4.87* 4.43* 3.98*  CALCIUM  8.8* 8.4* 8.8* 8.6* 8.7*  MG 2.3 2.4 2.6* 2.7* 2.7*  PHOS 4.9* 4.6 4.5 3.9 3.8   GFR: Estimated Creatinine Clearance: 27.4 mL/min (A) (by C-G formula based on SCr of 3.98 mg/dL (H)). Liver Function Tests: Recent Labs  Lab 06/25/23 0520 06/26/23 0500 06/27/23 0530 06/28/23 0500 06/29/23 0534  AST 21 21 29  32 31  ALT 13 13 13 15 15   ALKPHOS 103 94 91 93 93  BILITOT 1.7* 1.3* 1.1 1.0 1.0  PROT 7.5 6.9 6.9 7.1 7.1  ALBUMIN 3.7 3.5 3.4* 3.4* 3.4*   CBG: Recent Labs  Lab 06/26/23 2048  GLUCAP 197*    Recent Results (from  the past 240 hours)  Urine Culture     Status: None   Collection Time: 06/21/23  5:29 AM   Specimen: Urine, Clean Catch  Result Value Ref Range Status   Specimen Description   Final    URINE, CLEAN CATCH Performed at [facility], [address] [place], [place] [address]    Special Requests   Final    NONE Performed at [facility], [address], [place], [place] [address]    Culture   Final    NO GROWTH Performed at [facility], [address] [place], [place] [address]    Report Status 06/22/2023 FINAL  Final     Radiology Studies: No results found.   Scheduled Meds:  amiodarone   200 mg Oral Daily   apixaban   10 mg Oral BID   Followed by   Cecily Cohen ON 07/02/2023] apixaban   5 mg Oral BID   atorvastatin   80 mg Oral Daily   Chlorhexidine  Gluconate Cloth  6 each Topical Daily   influenza vac split trivalent PF  0.5 mL Intramuscular Tomorrow-1000   insulin  aspart  0-9 Units Subcutaneous TID WC   isosorbide -hydrALAZINE   0.5 tablet Oral TID   lactulose  10 g Oral BID   levothyroxine   125 mcg Oral QAC breakfast   lipase/protease/amylase  36,000 Units Oral TID AC   sodium chloride  flush  10-40 mL Intracatheter Q12H   sodium chloride  flush  3 mL Intravenous Q12H   sodium chloride  flush  3 mL Intravenous Q12H   Continuous Infusions:     LOS: 7 days  MDM: Patient is high risk for one or more organ failure.  They necessitate ongoing hospitalization for continued IV therapies and subsequent lab monitoring. Total time spent interpreting labs and vitals, reviewing the medical record, coordinating care amongst consultants and care team members, directly assessing and discussing care with the patient and/or family: 55 min  Donata Reddick, DO [facility]  To contact the attending physician between 7A-7P please use Epic Chat. To contact the covering physician during after hours 7P-7A, please review Amion.  06/29/2023, 4:30 PM   *This document has  been created with the assistance of dictation software. Please excuse typographical errors. *

## 2023-06-29 NOTE — Progress Notes (Cosign Needed Addendum)
 [name], male   DOB: March 02, 1965, 58 y.o.   MRN: [id number]     Advanced Heart Failure Rounding Note  Cardiologist: Belva Boyden, MD  Chief Complaint: CHF Subjective:   Net - 1.8L. Weight down 2lbs.  Scr 4.43>3.98. Nephrology following.   No co-ox this morning on milrinone  0.125 mcg/kg/min.   Back into flutter 5/3. Chemically converted with amiodarone  gtt. Now on PO amio and maintaining NSR.  RHC 06/23/23: RA mean 8, RV 48/11, PA 55/39, mean 39, PCWP mean 21. Oxygen saturations:, PA 57%, AO 93%, Fick CO 4.01/1.68. PVR 4.5 WU, PAPi 2   Feels fine this morning. Asked about ambulation and he wants to wait until he's out of the hospital to fully get moving.  Objective:   Venous dopplers showed DVT on left.   Weight Range: 126.9 kg Body mass index is 39.02 kg/m.   Vital Signs:   Temp:  [97.9 F (36.6 C)-99.1 F (37.3 C)] 99.1 F (37.3 C) (05/06 0335) Pulse Rate:  [74-84] 74 (05/06 0335) Resp:  [16-18] 16 (05/06 0335) BP: (134-147)/(90-110) 146/100 (05/06 0335) SpO2:  [93 %-98 %] 93 % (05/06 0335) Weight:  [126.9 kg] 126.9 kg (05/06 0500) Last BM Date : 06/21/23  Weight change: Filed Weights   06/27/23 0530 06/28/23 0500 06/29/23 0500  Weight: 127.3 kg 127.5 kg 126.9 kg   Intake/Output:   Intake/Output Summary (Last 24 hours) at 06/29/2023 0800 Last data filed at 06/29/2023 0500 Gross per 24 hour  Intake 387.4 ml  Output 2250 ml  Net -1862.6 ml    Physical Exam  General:  well appearing.  No respiratory difficulty Neck: supple. JVD ~7 cm.  Cor: PMI nondisplaced. Regular rate & rhythm. No rubs, gallops or murmurs. Lungs: clear Extremities: no cyanosis, clubbing, rash, edema. PICC LUE.  Neuro: alert & oriented x 3. Moves all 4 extremities w/o difficulty. Affect pleasant.  Telemetry   NSR 90s (personally reviewed).   Labs    CBC Recent Labs    06/28/23 0500 06/29/23 0534  WBC 7.6 8.2  NEUTROABS 5.4 5.9  HGB 13.4 13.6  HCT 41.0 42.1   MCV 82.2 81.9  PLT 222 258   Basic Metabolic Panel Recent Labs    40/98/11 0500 06/29/23 0534  NA 131* 131*  K 3.5 3.7  CL 91* 93*  CO2 27 28  GLUCOSE 170* 125*  BUN 64* 61*  CREATININE 4.43* 3.98*  CALCIUM  8.6* 8.7*  MG 2.7* 2.7*  PHOS 3.9 3.8   Liver Function Tests Recent Labs    06/28/23 0500 06/29/23 0534  AST 32 31  ALT 15 15  ALKPHOS 93 93  BILITOT 1.0 1.0  PROT 7.1 7.1  ALBUMIN 3.4* 3.4*   No results for input(s): "LIPASE", "AMYLASE" in the last 72 hours.  Cardiac Enzymes No results for input(s): "CKTOTAL", "CKMB", "CKMBINDEX", "TROPONINI" in the last 72 hours.  BNP: BNP (last 3 results) Recent Labs    09/25/22 2248 01/16/23 1133 06/21/23 0250  BNP 284.0* 358.6* 756.8*    ProBNP (last 3 results) No results for input(s): "PROBNP" in the last 8760 hours.   D-Dimer No results for input(s): "DDIMER" in the last 72 hours. Hemoglobin A1C Recent Labs    06/28/23 0500  HGBA1C 7.3*   Fasting Lipid Panel No results for input(s): "CHOL", "HDL", "LDLCALC", "TRIG", "CHOLHDL", "LDLDIRECT" in the last 72 hours. Thyroid  Function Tests No results for input(s): "TSH", "T4TOTAL", "T3FREE", "THYROIDAB" in the last 72 hours.  Invalid input(s): "FREET3"  Other results:   Imaging   No results found.   Medications:    Scheduled Medications:  amiodarone   200 mg Oral Daily   apixaban   10 mg Oral BID   Followed by   Cecily Cohen ON 07/02/2023] apixaban   5 mg Oral BID   atorvastatin   80 mg Oral Daily   Chlorhexidine  Gluconate Cloth  6 each Topical Daily   influenza vac split trivalent PF  0.5 mL Intramuscular Tomorrow-1000   insulin  aspart  0-9 Units Subcutaneous TID WC   lactulose  10 g Oral BID   levothyroxine   125 mcg Oral QAC breakfast   lipase/protease/amylase  36,000 Units Oral TID AC   sodium chloride  flush  10-40 mL Intracatheter Q12H   sodium chloride  flush  3 mL Intravenous Q12H   sodium chloride  flush  3 mL Intravenous Q12H    Infusions:   milrinone  0.125 mcg/kg/min (06/28/23 2209)    PRN Medications: acetaminophen , morphine  injection, ondansetron  **OR** ondansetron  (ZOFRAN ) IV, ondansetron  (ZOFRAN ) IV, mouth rinse, sodium chloride  flush, sodium chloride  flush, sodium chloride  flush  Assessment/Plan  1. Acute on chronic systolic CHF: Nonischemic cardiomyopathy, cath in 2016 with minimal CAD.  Cardiomyopathy since 2009.  TEE in 11/24 showed EF < 20%, severe RV dysfunction, mild MR.  Mildly elevated HS-TnI this admission with no trend likely signifies demand ischemia.   RHC 06/23/23 with mildly elevated filling pressures and low CI 1.68.  Patient was started on milrinone  0.25 and diuresis was continued.  He now looks euvolemic. SCr 4.43>3.98.  No co-ox this morning.  - He is off Lasix . Nephrology has been following.  - Restart bidil  1/2 tab today.  - Stat co-ox this morning, if stable will plan to stop milrinone  today.  - Holding Jardiance , plan to restart prior to discharge.  - Hold off on beta blocker with low output HF.   - End stage HF, low output on RHC In 3/24, CKD stage IV, and poor compliance with medication regimen and followup.  Therefore, not candidate for advanced therapies and also deemed not candidate for ICD.  2. Atrial arrhythmias: H/o paroxysmal AF. Atypical atrial flutter in 11/24 with DCCV.  - Went into atrial flutter 5/3. Chemically converted with amiodarone  gtt. Back on amiodarone  200 mg daily. Maintaining NSR.  - Continue apixaban .    3. Abdominal pain: Resolved.  Abdominal imaging unremarkable.  Suspect symptoms related to CHF.  4.CKD stage IV: Creatinine 3.88 => 4.28 => 4.98> 4.87>4.43>3.98. - Off Lasix .  - Restart Bidil . Holding Jardiance  as above.  - Milrinone  as above, stopping today 5. OSA: CPAP 6. HTN: Avoid hypotension. Now hypertensive. Meds as above.  7. DVT: Patient has a DVT on the left.  He was supposed to be on apixaban  at home but had not refilled since November, was probably not taking.  - He  has been restarted on apixaban  in the hospital.     Length of Stay: 7  Sheryl Donna, NP  06/29/2023, 8:00 AM  Advanced Heart Failure Team Pager [phone] (M-F; 7a - 5p)  Please contact [facility] for night-coverage after hours (5p -7a ) and weekends on [email or web address]

## 2023-06-29 NOTE — Plan of Care (Signed)
   Problem: Education: Goal: Knowledge of General Education information will improve Description: Including pain rating scale, medication(s)/side effects and non-pharmacologic comfort measures Outcome: Progressing   Problem: Health Behavior/Discharge Planning: Goal: Ability to manage health-related needs will improve Outcome: Progressing   Problem: Clinical Measurements: Goal: Ability to maintain clinical measurements within normal limits will improve Outcome: Progressing Goal: Will remain free from infection Outcome: Progressing Goal: Diagnostic test results will improve Outcome: Progressing Goal: Respiratory complications will improve Outcome: Progressing Goal: Cardiovascular complication will be avoided Outcome: Progressing   Problem: Activity: Goal: Risk for activity intolerance will decrease Outcome: Progressing   Problem: Nutrition: Goal: Adequate nutrition will be maintained Outcome: Progressing   Problem: Coping: Goal: Level of anxiety will decrease Outcome: Progressing   Problem: Elimination: Goal: Will not experience complications related to bowel motility Outcome: Progressing Goal: Will not experience complications related to urinary retention Outcome: Progressing   Problem: Pain Managment: Goal: General experience of comfort will improve and/or be controlled Outcome: Progressing   Problem: Safety: Goal: Ability to remain free from injury will improve Outcome: Progressing   Problem: Skin Integrity: Goal: Risk for impaired skin integrity will decrease Outcome: Progressing   Problem: Education: Goal: Understanding of CV disease, CV risk reduction, and recovery process will improve Outcome: Progressing Goal: Individualized Educational Video(s) Outcome: Progressing   Problem: Activity: Goal: Ability to return to baseline activity level will improve Outcome: Progressing   Problem: Cardiovascular: Goal: Ability to achieve and maintain adequate  cardiovascular perfusion will improve Outcome: Progressing Goal: Vascular access site(s) Level 0-1 will be maintained Outcome: Progressing   Problem: Health Behavior/Discharge Planning: Goal: Ability to safely manage health-related needs after discharge will improve Outcome: Progressing   Problem: Education: Goal: Ability to describe self-care measures that may prevent or decrease complications (Diabetes Survival Skills Education) will improve Outcome: Progressing Goal: Individualized Educational Video(s) Outcome: Progressing   Problem: Coping: Goal: Ability to adjust to condition or change in health will improve Outcome: Progressing   Problem: Fluid Volume: Goal: Ability to maintain a balanced intake and output will improve Outcome: Progressing   Problem: Health Behavior/Discharge Planning: Goal: Ability to identify and utilize available resources and services will improve Outcome: Progressing Goal: Ability to manage health-related needs will improve Outcome: Progressing   Problem: Metabolic: Goal: Ability to maintain appropriate glucose levels will improve Outcome: Progressing   Problem: Nutritional: Goal: Maintenance of adequate nutrition will improve Outcome: Progressing Goal: Progress toward achieving an optimal weight will improve Outcome: Progressing   Problem: Skin Integrity: Goal: Risk for impaired skin integrity will decrease Outcome: Progressing   Problem: Tissue Perfusion: Goal: Adequacy of tissue perfusion will improve Outcome: Progressing

## 2023-06-30 ENCOUNTER — Telehealth (HOSPITAL_COMMUNITY): Payer: Self-pay | Admitting: Pharmacy Technician

## 2023-06-30 ENCOUNTER — Other Ambulatory Visit (HOSPITAL_COMMUNITY): Payer: Self-pay

## 2023-06-30 DIAGNOSIS — I5023 Acute on chronic systolic (congestive) heart failure: Secondary | ICD-10-CM | POA: Diagnosis not present

## 2023-06-30 LAB — COOXEMETRY PANEL
Carboxyhemoglobin: 1.8 % — ABNORMAL HIGH (ref 0.5–1.5)
O2 Saturation: 92 %
Total hemoglobin: 14.4 g/dL (ref 12.0–16.0)
Total oxygen content: 89.6 %

## 2023-06-30 LAB — BASIC METABOLIC PANEL WITH GFR
Anion gap: 11 (ref 5–15)
BUN: 58 mg/dL — ABNORMAL HIGH (ref 6–20)
CO2: 27 mmol/L (ref 22–32)
Calcium: 8.9 mg/dL (ref 8.9–10.3)
Chloride: 92 mmol/L — ABNORMAL LOW (ref 98–111)
Creatinine, Ser: 4.05 mg/dL — ABNORMAL HIGH (ref 0.61–1.24)
GFR, Estimated: 16 mL/min — ABNORMAL LOW (ref >60)
Glucose, Bld: 131 mg/dL — ABNORMAL HIGH (ref 70–99)
Potassium: 4 mmol/L (ref 3.5–5.1)
Sodium: 130 mmol/L — ABNORMAL LOW (ref 135–145)

## 2023-06-30 MED ORDER — TORSEMIDE 20 MG PO TABS
40.0000 mg | ORAL_TABLET | Freq: Every day | ORAL | Status: DC
  Administered 2023-06-30 – 2023-07-01 (×2): 40 mg via ORAL
  Filled 2023-06-30 (×2): qty 2

## 2023-06-30 MED ORDER — ISOSORB DINITRATE-HYDRALAZINE 20-37.5 MG PO TABS
1.0000 | ORAL_TABLET | Freq: Three times a day (TID) | ORAL | Status: DC
  Administered 2023-06-30 – 2023-07-01 (×4): 1 via ORAL
  Filled 2023-06-30 (×5): qty 1

## 2023-06-30 NOTE — Progress Notes (Addendum)
 [name], male   DOB: 28-Feb-1965, 58 y.o.   MRN: [id number]     Advanced Heart Failure Rounding Note  Cardiologist: Belva Boyden, MD  Chief Complaint: CHF Subjective:   Back into flutter 5/3. Chemically converted with amiodarone  gtt. Now on PO amio and maintaining NSR.  Scr 4.43>3.98>4.05. Nephrology following.   Milrinone  stopped yesterday. No co-ox this morning. 1.7 L UOP yesterday with no diuretics. Weight down 5lbs?  Feels good this morning. Asking about discharge. Denies CP/SOB. Reports good UOP.  Objective:   RHC 06/23/23: RA mean 8, RV 48/11, PA 55/39, mean 39, PCWP mean 21. Oxygen saturations:, PA 57%, AO 93%, Fick CO 4.01/1.68. PVR 4.5 WU, PAPi 2   Venous dopplers showed DVT on left.   Weight Range: 124.6 kg Body mass index is 38.31 kg/m.   Vital Signs:   Temp:  [97.7 F (36.5 C)-98.7 F (37.1 C)] 98 F (36.7 C) (05/07 0801) Pulse Rate:  [63-83] 63 (05/07 0801) Resp:  [16] 16 (05/07 0341) BP: (132-137)/(87-91) 137/91 (05/07 0700) SpO2:  [93 %-95 %] 95 % (05/07 0801) Weight:  [124.6 kg] 124.6 kg (05/07 0500) Last BM Date : 06/21/23  Weight change: Filed Weights   06/28/23 0500 06/29/23 0500 06/30/23 0500  Weight: 127.5 kg 126.9 kg 124.6 kg   Intake/Output:   Intake/Output Summary (Last 24 hours) at 06/30/2023 0950 Last data filed at 06/30/2023 0700 Gross per 24 hour  Intake 1151.17 ml  Output 1350 ml  Net -198.83 ml    Physical Exam  General:  well appearing, sitting on EOB.  No respiratory difficulty Neck: supple. JVD ~7 cm.  Cor: PMI nondisplaced. Regular rate & rhythm. No rubs, gallops or murmurs. Lungs: clear Extremities: no cyanosis, clubbing, rash, edema. PICC LUE.  Neuro: alert & oriented x 3. Moves all 4 extremities w/o difficulty. Affect pleasant.  Telemetry   NSR 80s (personally reviewed).   Labs    CBC Recent Labs    06/28/23 0500 06/29/23 0534  WBC 7.6 8.2  NEUTROABS 5.4 5.9  HGB 13.4 13.6  HCT 41.0 42.1   MCV 82.2 81.9  PLT 222 258   Basic Metabolic Panel Recent Labs    40/98/11 0500 06/29/23 0534 06/30/23 0520  NA 131* 131* 130*  K 3.5 3.7 4.0  CL 91* 93* 92*  CO2 27 28 27   GLUCOSE 170* 125* 131*  BUN 64* 61* 58*  CREATININE 4.43* 3.98* 4.05*  CALCIUM  8.6* 8.7* 8.9  MG 2.7* 2.7*  --   PHOS 3.9 3.8  --    Liver Function Tests Recent Labs    06/28/23 0500 06/29/23 0534  AST 32 31  ALT 15 15  ALKPHOS 93 93  BILITOT 1.0 1.0  PROT 7.1 7.1  ALBUMIN 3.4* 3.4*   No results for input(s): "LIPASE", "AMYLASE" in the last 72 hours.  Cardiac Enzymes No results for input(s): "CKTOTAL", "CKMB", "CKMBINDEX", "TROPONINI" in the last 72 hours.  BNP: BNP (last 3 results) Recent Labs    09/25/22 2248 01/16/23 1133 06/21/23 0250  BNP 284.0* 358.6* 756.8*    ProBNP (last 3 results) No results for input(s): "PROBNP" in the last 8760 hours.   D-Dimer No results for input(s): "DDIMER" in the last 72 hours. Hemoglobin A1C Recent Labs    06/28/23 0500  HGBA1C 7.3*   Fasting Lipid Panel No results for input(s): "CHOL", "HDL", "LDLCALC", "TRIG", "CHOLHDL", "LDLDIRECT" in the last 72 hours. Thyroid  Function Tests No results for input(s): "TSH", "T4TOTAL", "T3FREE", "THYROIDAB"  in the last 72 hours.  Invalid input(s): "FREET3"  Other results:   Imaging   No results found.   Medications:    Scheduled Medications:  amiodarone   200 mg Oral Daily   apixaban   10 mg Oral BID   Followed by   Cecily Cohen ON 07/02/2023] apixaban   5 mg Oral BID   atorvastatin   80 mg Oral Daily   Chlorhexidine  Gluconate Cloth  6 each Topical Daily   influenza vac split trivalent PF  0.5 mL Intramuscular Tomorrow-1000   insulin  aspart  0-9 Units Subcutaneous TID WC   isosorbide -hydrALAZINE   1 tablet Oral TID   lactulose  10 g Oral BID   levothyroxine   125 mcg Oral QAC breakfast   lipase/protease/amylase  36,000 Units Oral TID AC   sodium chloride  flush  10-40 mL Intracatheter Q12H   sodium  chloride flush  3 mL Intravenous Q12H   sodium chloride  flush  3 mL Intravenous Q12H   torsemide   40 mg Oral Daily    Infusions:    PRN Medications: acetaminophen , morphine  injection, ondansetron  **OR** ondansetron  (ZOFRAN ) IV, ondansetron  (ZOFRAN ) IV, mouth rinse, sodium chloride  flush, sodium chloride  flush, sodium chloride  flush  Assessment/Plan  1. Acute on chronic systolic CHF: Nonischemic cardiomyopathy, cath in 2016 with minimal CAD.  Cardiomyopathy since 2009.  TEE in 11/24 showed EF < 20%, severe RV dysfunction, mild MR.  Mildly elevated HS-TnI this admission with no trend likely signifies demand ischemia.   RHC 06/23/23 with mildly elevated filling pressures and low CI 1.68.  Patient was started on milrinone  0.25 and diuresis was continued. SCr 4.43>3.98> 4.05. Milrinone  stopped yesterday.  - Restart home diuretics and follow response. On metolazone  3x/week before (he reports that he was not taking it 3x/week if any). Would avoid metolazone  at discharge and go up on Torsemide  if needed. Restart 40 mg daily.  - Increase bidil  1/2>1 tab today.  - No co-ox this morning. Stat ordered. - Holding Jardiance , plan to restart prior to discharge.  - Hold off on beta blocker with low output HF.   - End stage HF, low output on RHC In 3/24, CKD stage IV, and poor compliance with medication regimen and followup.  Therefore, not candidate for advanced therapies and also deemed not candidate for ICD.  2. Atrial arrhythmias: H/o paroxysmal AF. Atypical atrial flutter in 11/24 with DCCV.  - Went into atrial flutter 5/3. Chemically converted with amiodarone  gtt. Back on amiodarone  200 mg daily. Maintaining NSR.  - Continue apixaban .    3. Abdominal pain: Resolved.  Abdominal imaging unremarkable.  Suspect symptoms related to CHF.  4.CKD stage IV: Creatinine 3.88 => 4.28 => 4.98> 4.87>4.43>3.98>4.05. - Off Lasix .  - Holding Jardiance  as above.  - Milrinone  stopped yesterday. Co-ox pending today.  -  Nephrology following.  5. OSA: CPAP 6. HTN: Avoid hypotension. Now hypertensive. Meds as above.  7. DVT: Patient has a DVT on the left.  He was supposed to be on apixaban  at home but had not refilled since November, was probably not taking.  - He has been restarted on apixaban  in the hospital.     Length of Stay: 8  Sheryl Donna, NP  06/30/2023, 9:50 AM  Advanced Heart Failure Team Pager [phone] (M-F; 7a - 5p)  Please contact [facility] for night-coverage after hours (5p -7a ) and weekends on [email or web address]  Patient seen and examined with the above-signed Advanced Practice Provider and/or Housestaff. I personally reviewed laboratory data, imaging studies and relevant notes. I  independently examined the patient and formulated the important aspects of the plan. I have edited the note to reflect any of my changes or salient points. I have personally discussed the plan with the patient and/or family.  Feels better with fluid off. Scr stable around 4. Vitals ok  General:  Sitting up on side of bed No resp difficulty HEENT: normal Neck: supple. no JVD. Carotids 2+ bilat; no bruits. No lymphadenopathy or thryomegaly appreciated. Cor: PMI nondisplaced. Regular rate & rhythm. No rubs, gallops or murmurs. Lungs: clear Abdomen: obese soft, nontender, nondistended. No hepatosplenomegaly. No bruits or masses. Good bowel sounds. Extremities: no cyanosis, clubbing, rash, edema Neuro: alert & orientedx3, cranial nerves grossly intact. moves all 4 extremities w/o difficulty. Affect pleasant  He is stable for d/c from a HF standpoint. Renal functioning declining to a point where he will likely need HD soon but with low EF suspect he may have trouble tolerating. Unfortunately not candidate for heart-kidney transplant eval due to non-compliance. Maybe PD will be an option.   Would continue to hold Jardiance . Otherwise ok to d/c on current regimen.   HF Team will s/o.   Jules Oar, MD  4:35  PM

## 2023-06-30 NOTE — Progress Notes (Signed)
 Transition of Care [facility]) - Inpatient Brief Assessment   Patient Details  Name: OLUWATOBI KINDSCHI MRN: [id number] Date of Birth: 1966/01/07  Transition of Care [facility]) CM/SW Contact:    Hoa Briggs C Demarco Bacci, RN Phone Number: 06/30/2023, 9:00 AM   Clinical Narrative: TOC continuing to follow patient's progress throughout discharge planning.   Transition of Care Asessment: Insurance and Status: Insurance coverage has been reviewed Patient has primary care physician: Yes   Prior level of function:: Independent Prior/Current Home Services: No current home services Social Drivers of Health Review: SDOH reviewed no interventions necessary Readmission risk has been reviewed: Yes Transition of care needs: no transition of care needs at this time

## 2023-06-30 NOTE — Telephone Encounter (Signed)
 Pharmacy Patient Advocate Encounter  Received notification from [place] MEDICA[employer] that Prior Authorization for Jardiance  10 mg has been APPROVED from 06/30/2023 to 06/29/2024. Ran test claim, Copay is $4.00. This test claim was processed through [facility]- copay amounts may vary at other pharmacies due to pharmacy/plan contracts, or as the patient moves through the different stages of their insurance plan.   PA #/Case ID/Reference #: [id number]

## 2023-06-30 NOTE — Evaluation (Addendum)
 Physical Therapy Evaluation Patient Details Name: Hector [name] MRN: [id number] DOB: Feb 18, 1966 Today's Date: 06/30/2023  History of Present Illness  Pt is a 58 y.o. male presenting with abdominal pain, acute on chronic HFrEF. PMH of heart failure with reduced ejection fraction with EF less than 20%, nonischemic cardiomyopathy, history of alcohol use, paroxysmal atrial fibrillation/flutter on Eliquis , struct of sleep apnea on CPAP, chronic kidney disease stage IV, CVA, hypertension, hyperlipidemia, type 2 diabetes mellitus  Clinical Impression  Pt did well with all mobility tasks, had mild/expected unsteadiness that was never a safety issue and that he reports as near his baseline.  He did not need assist with bed mobility, transfers, and was able to do a few loops in the room w/o AD.  Pt was not too excited about working with PT and repeats he does not want further PT here or upon return to home.  Pt overall did well, showed ability to safely manage in the home, will sign off.        If plan is discharge home, recommend the following:  (yes)   Can travel by private vehicle        Equipment Recommendations None recommended by PT  Recommendations for Other Services       Functional Status Assessment Patient has not had a recent decline in their functional status     Precautions / Restrictions Precautions Precautions: None Restrictions Weight Bearing Restrictions Per Provider Order: No      Mobility  Bed Mobility Overal bed mobility: Independent             General bed mobility comments: easily gets himself up to sitting EOB w/o assist, hesitant as he was more interested in sleeping    Transfers Overall transfer level: Independent Equipment used: None               General transfer comment: used UEs to pull up on sink, no assist or unsteadiness    Ambulation/Gait Ambulation/Gait assistance: Modified independent (Device/Increase time) Gait Distance (Feet):  60 Feet Assistive device: None         General Gait Details: Pt able to ambulate in the room with good relative confidence - occasional incidental use of UEs for stability to no safety/balance issues noted.  Pt's HR did increase to the 100s with occsional spikes into the 120-130s range.  Mild fatigue with modest ambualtion, O2 drops to 88% on room air, back to 90s after 1-2 minutes of sitting.  Stairs            Wheelchair Mobility     Tilt Bed    Modified Rankin (Stroke Patients Only)       Balance Overall balance assessment: Modified Independent                                           Pertinent Vitals/Pain Pain Assessment Pain Assessment: No/denies pain    Home Living Family/patient expects to be discharged to:: Private residence Living Arrangements: Parent;Other relatives Available Help at Discharge: Family (brother (works 3rd shift), mom) Type of Home: House Home Access: Ramped entrance       Home Layout: One level Home Equipment: None      Prior Function Prior Level of Function : Independent/Modified Independent             Mobility Comments: IND without AD household distances ADLs Comments: reports IND  with ADL management; brother drives him to MD appts, he does not go to grocery store     Extremity/Trunk Assessment   Upper Extremity Assessment Upper Extremity Assessment: Overall WFL for tasks assessed    Lower Extremity Assessment Lower Extremity Assessment: Overall WFL for tasks assessed       Communication   Communication Communication: No apparent difficulties    Cognition Arousal: Alert Behavior During Therapy: WFL for tasks assessed/performed                             Following commands: Intact       Cueing Cueing Techniques: Verbal cues, Tactile cues, Visual cues     General Comments General comments (skin integrity, edema, etc.): Pt was only willing to work with PT once he was told  we'd stop coming back to try and get him up and see how he's doing.  Fortunately he did move well enough to "pass" PT and show he's appropriate in-home safety    Exercises     Assessment/Plan    PT Assessment Patient does not need any further PT services  PT Problem List         PT Treatment Interventions      PT Goals (Current goals can be found in the Care Plan section)  Acute Rehab PT Goals Patient Stated Goal: Go home PT Goal Formulation: With patient Time For Goal Achievement: 07/13/23 Potential to Achieve Goals: Good    Frequency       Co-evaluation               AM-PAC PT "6 Clicks" Mobility  Outcome Measure Help needed turning from your back to your side while in a flat bed without using bedrails?: None Help needed moving from lying on your back to sitting on the side of a flat bed without using bedrails?: None Help needed moving to and from a bed to a chair (including a wheelchair)?: None Help needed standing up from a chair using your arms (e.g., wheelchair or bedside chair)?: None Help needed to walk in hospital room?: None Help needed climbing 3-5 steps with a railing? : A Little 6 Click Score: 23    End of Session Equipment Utilized During Treatment: Gait belt Activity Tolerance: Patient limited by fatigue Patient left: in bed (refused sitting up in recliner) Nurse Communication: Mobility status (vitals with activity) PT Visit Diagnosis: Muscle weakness (generalized) (M62.81);Unsteadiness on feet (R26.81)    Time: 0454-0981 PT Time Calculation (min) (ACUTE ONLY): 9 min   Charges:   PT Evaluation $PT Eval Low Complexity: 1 Low   PT General Charges $$ ACUTE PT VISIT: 1 Visit         Darice Edelman, DPT 06/30/2023, 11:26 AM

## 2023-06-30 NOTE — Evaluation (Signed)
 Physical Therapy Evaluation Patient Details Name: Hector [name] MRN: [id number] DOB: April 13, 1965 Today's Date: 06/30/2023  History of Present Illness  Pt is a 58 y.o. male presenting with abdominal pain, acute on chronic HFrEF. PMH of heart failure with reduced ejection fraction with EF less than 20%, nonischemic cardiomyopathy, history of alcohol use, paroxysmal atrial fibrillation/flutter on Eliquis , struct of sleep apnea on CPAP, chronic kidney disease stage IV, CVA, hypertension, hyperlipidemia, type 2 diabetes mellitus  Clinical Impression  Pt initially refusing to work with PT, with long discussion he was ultimately willing to show he can move well enough to safety go home and agreed to do to only if it ment we wouldn't keep coming back to "bother" him.  Ultimately he showed safe and near baseline effort with 2 small in-room loops with no overt LOBs or safety issues and showed ability to manage at home with relatively safely.  Pt could benefit from continued PT for mild unsteadiness and activity tolerance issues but made it very clear that he does not intend on working with PT and will do mobility on his own terms (knows to call the nurses when needed).  Requests completing PT orders, fortunately he showed ability to appropriately perform mobility in the home w/o AD.       If plan is discharge home, recommend the following:  (yes)   Can travel by private vehicle        Equipment Recommendations None recommended by PT  Recommendations for Other Services       Functional Status Assessment Patient has not had a recent decline in their functional status     Precautions / Restrictions Precautions Precautions: None Restrictions Weight Bearing Restrictions Per Provider Order: No      Mobility  Bed Mobility Overal bed mobility: Independent             General bed mobility comments: easily gets himself up to sitting EOB w/o assist, hesitant as he was more interested in  sleeping    Transfers Overall transfer level: Independent Equipment used: None               General transfer comment: used UEs to pull up on sink, no assist or unsteadiness    Ambulation/Gait Ambulation/Gait assistance: Modified independent (Device/Increase time) Gait Distance (Feet): 60 Feet Assistive device: None         General Gait Details: Pt able to ambulate in the room with good relative confidence - occasional incidental use of UEs for stability to no safety/balance issues noted.  Pt's HR did increase to the 120s with occsional spikes into the 140-150s range.  Mild fatigue with modest ambualtion, O2 drops to 88% on room air, back to 90s after 1-2 minutes of sitting.  Stairs            Wheelchair Mobility     Tilt Bed    Modified Rankin (Stroke Patients Only)       Balance Overall balance assessment: Modified Independent                                           Pertinent Vitals/Pain Pain Assessment Pain Assessment: No/denies pain    Home Living Family/patient expects to be discharged to:: Private residence Living Arrangements: Parent;Other relatives Available Help at Discharge: Family (brother) Type of Home: House Home Access: Ramped entrance       Home Layout:  One level Home Equipment: None      Prior Function Prior Level of Function : Independent/Modified Independent             Mobility Comments: IND without AD household distances ADLs Comments: reports IND with ADL management; brother drives him to MD appts, he does not go to grocery store     Extremity/Trunk Assessment   Upper Extremity Assessment Upper Extremity Assessment: Overall WFL for tasks assessed    Lower Extremity Assessment Lower Extremity Assessment: Overall WFL for tasks assessed       Communication   Communication Communication: No apparent difficulties    Cognition Arousal: Alert Behavior During Therapy: WFL for tasks  assessed/performed                             Following commands: Intact       Cueing Cueing Techniques: Verbal cues, Tactile cues, Visual cues     General Comments General comments (skin integrity, edema, etc.): Pt was only willing to work with PT once he was told we'd stop coming back to try and get him up and see how he's doing.  Fortunately he did move well enough to "pass" PT and show he's appropriate in-home safety    Exercises     Assessment/Plan    PT Assessment Patient does not need any further PT services  PT Problem List         PT Treatment Interventions      PT Goals (Current goals can be found in the Care Plan section)  Acute Rehab PT Goals Patient Stated Goal: Go home PT Goal Formulation: With patient Time For Goal Achievement: 07/13/23 Potential to Achieve Goals: Good    Frequency       Co-evaluation               AM-PAC PT "6 Clicks" Mobility  Outcome Measure Help needed turning from your back to your side while in a flat bed without using bedrails?: None Help needed moving from lying on your back to sitting on the side of a flat bed without using bedrails?: None Help needed moving to and from a bed to a chair (including a wheelchair)?: None Help needed standing up from a chair using your arms (e.g., wheelchair or bedside chair)?: None Help needed to walk in hospital room?: None Help needed climbing 3-5 steps with a railing? : A Little 6 Click Score: 23    End of Session Equipment Utilized During Treatment: Gait belt Activity Tolerance: Patient limited by fatigue Patient left: in bed (refused sitting up in recliner) Nurse Communication: Mobility status (vitals with activity) PT Visit Diagnosis: Muscle weakness (generalized) (M62.81);Unsteadiness on feet (R26.81)    Time: 1610-9604 PT Time Calculation (min) (ACUTE ONLY): 9 min   Charges:   PT Evaluation $PT Eval Low Complexity: 1 Low   PT General Charges $$ ACUTE PT  VISIT: 1 Visit         Darice Edelman, DPT 06/30/2023, 11:18 AM

## 2023-06-30 NOTE — Telephone Encounter (Signed)
 Pharmacy Patient Advocate Encounter   Received notification from Inpatient Request that prior authorization for Jardiance  10 mg is required/requested.   Insurance verification completed.   The patient is insured through [facility] MEDICAID .   Per test claim: PA required; PA submitted to above mentioned insurance via [facility] Tracks Key/confirmation #/EOC [id number] W Status is pending

## 2023-06-30 NOTE — Progress Notes (Signed)
 Progress Note   Patient: Hector [name] JWJ:[id number] DOB: [birth year] DOA: 06/21/2023     8 DOS: the patient was seen and examined on 06/30/2023   Brief hospital course:  ORYON BRUGGEMAN is a 58 year old male with heart failure with reduced EF less than 20%, nonischemic cardiomyopathy, history of alcohol use disorder sober for 13 years, paroxysmal A-fib/flutter on Eliquis , OSA on CPAP, CKD stage IV, CVA, hypertension, hyperlipidemia, type 2 diabetes, presented to the hospital with abdominal pain, nausea, vomiting and increased lower extremity swelling with progressive dyspnea.  He was admitted for acute on chronic CHF exacerbation.    05/07 -sleeping but arouses easily.  Has not had a bowel movement since admission and declines a stool softener.  Hesitant to participate with any formal physical therapy  Assessment and Plan:  Principal Problem:   Acute on chronic HFrEF (heart failure with reduced ejection fraction) (HCC) Active Problems:   Nausea and vomiting   Chronic renal impairment, stage 4 (severe) (HCC)   OSA (obstructive sleep apnea)   Type II diabetes mellitus with renal manifestations (HCC)   Hyperbilirubinemia   Essential hypertension   Alcohol use, unspecified, in remission   Gout   Paroxysmal atrial flutter (HCC)   Alcoholic cirrhosis of liver without ascites (HCC)   Intractable vomiting with nausea   NSTEMI (non-ST elevated myocardial infarction) (HCC)     Acute on chronic systolic CHF Nonischemic cardiomyopathy - BNP 756 on arrival - TEE November 2024: EF less than 20% - RHC 4/30: PCWP elevated, mixed pulmonary venous/pulmonary arterial hypertension.  Low cardiac output. - Has been on milrinone , discontinued 06/29/23 - Required IV amiodarone  for A-fib RVR, now back to sinus.  On p.o. amiodarone  - Cardiorenal syndrome complicating care.  Renal function has rebounded some but remains tenuous - Patient will have ongoing diuresis needs though this is difficult  given his kidney function - Appreciate cardiology input.  Patient's torsemide  resumed -Will continue to hold Jardiance , isosorbide , hydralazine , Jardiance .   - Not a candidate for beta-blocker due to low output heart failure - Currently not a candidate for ICD given poor compliance with medications and CKD stage IV    Medication nonadherence - Patient demonstrates little interest in his medical care.  Previously nonadherent to outpatient medications - Resistant to ambulation with physical therapy. - Resistant to discussing care management during daily rounds - Unfortunately anticipate patient will return to the hospital after discharge.  Have discussed this with him as well.    Left lower extremity DVT - Currently on loading dose Eliquis  - Will need indefinite Eliquis  at discharge.  Was meant to be on this medication for A-fib but was not taking consistently. - Have discussed extensively with the patient - Pharmacy has confirmed this medication will be affordable outpatient     Significant deconditioning - Secondary to multiple chronic diseases as above - Appreciate PT input, they recommend home health on discharge    Elevated troponins - Likely secondary to chronic myocardial injury, has been chronically elevated in the past -Suspect type II MI from demand ischemia related to his CHF - No STEMI - Cardiology following as above   Hypoxia - Secondary to volume overload. - Diuresis as tolerated - Wean off oxygen as tolerated   Nausea, vomiting, abdominal pain - Resolved.   Paroxysmal A-fib - DCCV November 2024 - Atypical flutter with rates into the 130s on 5/3.  Initiated amiodarone  drip.  Better controlled now - Back on p.o. amiodarone  per cardiology and is currently in  sinus rhythm - Continue to monitor and replete electrolytes daily.  Trend CMP - Continue to monitor on telemetry.   CKD stage IV - Creatinine baseline near 3.4-3.8. - Complicated by cardiorenal  syndrome - Continue to follow urinary output, remains stable - Inability to truly diurese patient given kidney function - Anticipate patient will need dialysis within the next 6 months but creatinine appears to be rebounding now - Hep B surface antigen nonreactive - Nephrology has been consulted, appreciate their recommendations - Avoid additional nephrotoxic meds if able   Type 2 diabetes - Jardiance  has been held given worsening CKD as above. - He is refusing CBGs and sliding scale insulin  citing that he is not diabetic despite education - hgb A1c 7.3% - Continue to monitor blood glucose on daily CMP's, and remains elevated but not above 200. - Continue to encourage CBGs as patient will allow    History of CVA Hypertension Hyperlipidemia - Continue statin as above   OSA - CPAP at night   Liver cirrhosis Remote history of alcohol use disorder - CMP stable.    BMI 37 Obesity, class II Complicates overall prognosis and care Lifestyle modification and exercise has been discussed with patient in detail   Hyponatremia Appears to be chronic related to volume overload Monitor sodium levels closely     Subjective: Denies having any complaints.  States that he has not had a bowel movement since admission but declines a stool softener.  Physical Exam: Vitals:   06/30/23 1100 06/30/23 1200 06/30/23 1300 06/30/23 1400  BP: 125/79 124/84 132/79 131/81  Pulse: 70     Resp:      Temp: 97.9 F (36.6 C)     TempSrc: Oral     SpO2: 99%     Weight:      Height:       General exam: Appears calm and comfortable, NAD obese Respiratory system: No work of breathing, symmetric chest wall expansion Cardiovascular system: S1 & S2 heard, RRR.  Gastrointestinal system: Abdomen is nondistended, soft and nontender.  Neuro: Alert and oriented. No focal neurological deficits. Extremities: No lower extremity edema today Skin: No rashes, lesions Psychiatry: Demonstrates appropriate  judgement and insight. Mood & affect appropriate for situation.    Data Reviewed: Labs reviewed.  Sodium 130, BUN 58, creatinine 4.05 There are no new results to review at this time.  Family Communication: Plan of care discussed with patient  Disposition: Status is: Inpatient Remains inpatient appropriate because: Discharge planning  Planned Discharge Destination:  Home with home health    Time spent: 38 minutes  Author: Read Camel, MD 06/30/2023 2:44 PM  For on call review [email or web address].

## 2023-06-30 NOTE — Telephone Encounter (Signed)
 Patient Product/process development scientist completed.    The patient is insured through [facility] MEDICAID.     Ran test claim for Farxiga  10 mg and Requires Prior Authorization  Ran test claim for Jardiance  10 mg and Requires Prior Authorization  This test claim was processed through [employer]- copay amounts may vary at other pharmacies due to [employer], or as the patient moves through the different stages of their insurance plan.     Morgan Arab, CPHT Pharmacy Technician III Certified Patient Advocate [facility] Pharmacy Patient Advocate Team Direct Number: [phone]  Fax: [phone]

## 2023-06-30 NOTE — Progress Notes (Signed)
 [facility]  ROUNDING NOTE   Subjective:   Hector [name]  is a 58 y.o. male with past medical conditions including hypertension, paroxysmal A-fib on Eliquis , CHF with a EF less than 20%, and chronic kidney disease stage IV.  Patient presents to the emergency department complaining of shortness of breath and swelling and has been admitted for Generalized abdominal pain [R10.84] Hypertensive urgency [I16.0] Calculus of gallbladder without cholecystitis without obstruction [K80.20] Urinary tract infection without hematuria, site unspecified [N39.0] Intractable vomiting with nausea [R11.2] Acute on chronic congestive heart failure, unspecified heart failure type (HCC) [I50.9] Nausea and vomiting, unspecified vomiting type [R11.2]  Patient is known to our practice from previous admissions and is currently followed by [facility] nephrology.  Patient states he began having shortness of breath about a week prior to presentation.  States he has been compliant with all medications including diuretics.  Update Patient sitting at side of bed Alert and oriented Appetite appropriate  Creatinine 4.05 Urine output  Objective:  Vital signs in last 24 hours:  Temp:  [97.9 F (36.6 C)-98.7 F (37.1 C)] 97.9 F (36.6 C) (05/07 1100) Pulse Rate:  [63-70] 70 (05/07 1100) Resp:  [16] 16 (05/07 0341) BP: (108-156)/(72-97) 131/81 (05/07 1400) SpO2:  [93 %-99 %] 99 % (05/07 1100) Weight:  [124.6 kg] 124.6 kg (05/07 0500)  Weight change: -2.3 kg Filed Weights   06/28/23 0500 06/29/23 0500 06/30/23 0500  Weight: 127.5 kg 126.9 kg 124.6 kg    Intake/Output: I/O last 3 completed shifts: In: 1432 [P.O.:1320; I.V.:112] Out: 2700 [Urine:2700]   Intake/Output this shift:  Total I/O In: 600 [P.O.:600] Out: -   Physical Exam: General: NAD  Head: Normocephalic, atraumatic. Moist oral mucosal membranes  Eyes: Anicteric  Lungs:  Clear to auscultation, normal effort, cough  Heart:  Regular rate and rhythm  Abdomen:  Soft, nontender,   Extremities: No peripheral edema.  Neurologic: Alert and oriented, moving all four extremities  Skin: No lesions  Access: None    Basic Metabolic Panel: Recent Labs  Lab 06/25/23 0520 06/26/23 0500 06/27/23 0530 06/28/23 0500 06/29/23 0534 06/30/23 0520  NA 130* 130* 132* 131* 131* 130*  K 3.6 3.2* 3.5 3.5 3.7 4.0  CL 89* 88* 89* 91* 93* 92*  CO2 28 27 28 27 28 27   GLUCOSE 129* 186* 147* 170* 125* 131*  BUN 58* 64* 69* 64* 61* 58*  CREATININE 4.98* 4.83* 4.87* 4.43* 3.98* 4.05*  CALCIUM  8.8* 8.4* 8.8* 8.6* 8.7* 8.9  MG 2.3 2.4 2.6* 2.7* 2.7*  --   PHOS 4.9* 4.6 4.5 3.9 3.8  --     Liver Function Tests: Recent Labs  Lab 06/25/23 0520 06/26/23 0500 06/27/23 0530 06/28/23 0500 06/29/23 0534  AST 21 21 29  32 31  ALT 13 13 13 15 15   ALKPHOS 103 94 91 93 93  BILITOT 1.7* 1.3* 1.1 1.0 1.0  PROT 7.5 6.9 6.9 7.1 7.1  ALBUMIN 3.7 3.5 3.4* 3.4* 3.4*   No results for input(s): "LIPASE", "AMYLASE" in the last 168 hours.  No results for input(s): "AMMONIA" in the last 168 hours.  CBC: Recent Labs  Lab 06/25/23 0520 06/26/23 0500 06/27/23 0530 06/28/23 0500 06/29/23 0534  WBC 8.8 8.3 7.5 7.6 8.2  NEUTROABS 6.3 5.8 5.0 5.4 5.9  HGB 14.4 13.5 13.4 13.4 13.6  HCT 43.7 41.0 40.3 41.0 42.1  MCV 82.6 82.0 81.3 82.2 81.9  PLT 273 257 248 222 258    Cardiac Enzymes: No results  for input(s): "CKTOTAL", "CKMB", "CKMBINDEX", "TROPONINI" in the last 168 hours.  BNP: Invalid input(s): "POCBNP"  CBG: Recent Labs  Lab 06/26/23 2048  GLUCAP 197*    Microbiology: Results for orders placed or performed during the hospital encounter of 06/21/23  Urine Culture     Status: None   Collection Time: 06/21/23  5:29 AM   Specimen: Urine, Clean Catch  Result Value Ref Range Status   Specimen Description   Final    URINE, CLEAN CATCH Performed at [facility], [address] [place], [place] [address]     Special Requests   Final    NONE Performed at [facility], [address] [place], [place] [address]    Culture   Final    NO GROWTH Performed at [facility], [address] [place], [place] [address]    Report Status 06/22/2023 FINAL  Final    Coagulation Studies: No results for input(s): "LABPROT", "INR" in the last 72 hours.  Urinalysis: No results for input(s): "COLORURINE", "LABSPEC", "PHURINE", "GLUCOSEU", "HGBUR", "BILIRUBINUR", "KETONESUR", "PROTEINUR", "UROBILINOGEN", "NITRITE", "LEUKOCYTESUR" in the last 72 hours.  Invalid input(s): "APPERANCEUR"    Imaging: No results found.    Medications:      amiodarone   200 mg Oral Daily   apixaban   10 mg Oral BID   Followed by   Cecily Cohen ON 07/02/2023] apixaban   5 mg Oral BID   atorvastatin   80 mg Oral Daily   Chlorhexidine  Gluconate Cloth  6 each Topical Daily   influenza vac split trivalent PF  0.5 mL Intramuscular Tomorrow-1000   insulin  aspart  0-9 Units Subcutaneous TID WC   isosorbide -hydrALAZINE   1 tablet Oral TID   lactulose  10 g Oral BID   levothyroxine   125 mcg Oral QAC breakfast   lipase/protease/amylase  36,000 Units Oral TID AC   sodium chloride  flush  10-40 mL Intracatheter Q12H   sodium chloride  flush  3 mL Intravenous Q12H   sodium chloride  flush  3 mL Intravenous Q12H   torsemide   40 mg Oral Daily   acetaminophen , morphine  injection, ondansetron  **OR** ondansetron  (ZOFRAN ) IV, ondansetron  (ZOFRAN ) IV, mouth rinse, sodium chloride  flush, sodium chloride  flush, sodium chloride  flush  Assessment/ Plan:  [name] is a 58 y.o.  male  with past medical conditions including hypertension, paroxysmal A-fib on Eliquis , CHF with a EF less than 20%, and chronic kidney disease stage IV.  Patient presents to the emergency department complaining of shortness of breath and swelling and has been admitted for Generalized abdominal pain [R10.84] Hypertensive urgency [I16.0] Calculus of  gallbladder without cholecystitis without obstruction [K80.20] Urinary tract infection without hematuria, site unspecified [N39.0] Intractable vomiting with nausea [R11.2] Acute on chronic congestive heart failure, unspecified heart failure type (HCC) [I50.9] Nausea and vomiting, unspecified vomiting type [R11.2]   Acute Kidney Injury on chronic kidney disease stage 4 with baseline creatinine 3.35 and GFR of 20 on 05/06/2023.  Follows with [facility] nephrology. Acute kidney injury secondary to hemodynamic instability and suspected cardiorenal syndrome Patient did undergo right heart cath however contrast is usually not utilized for this.    Creatinine stable with good urine output. No acute need for dialysis at this time. Will continue to monitor. Will need to follow up with [facility] nephrology at discharge.   Lab Results  Component Value Date   CREATININE 4.05 (H) 06/30/2023   CREATININE 3.98 (H) 06/29/2023   CREATININE 4.43 (H) 06/28/2023    Intake/Output Summary (Last 24 hours)  at 06/30/2023 1525 Last data filed at 06/30/2023 1421 Gross per 24 hour  Intake 1391.17 ml  Output 1350 ml  Net 41.17 ml    2.  Chronic systolic heart failure.  TEE echo completed on 01/19/2023 shows EF less than 20%.  Cardiology following and performed right heart cath on 4/30 which showed mixed pulmonary venous/arterial hypertension with low cardiac output and PCWP elevation.  Milrinone   stopped. Remains on amiodarone   3. Anemia of chronic kidney disease Lab Results  Component Value Date   HGB 13.6 06/29/2023    Hemoglobin within optimal range.    LOS: 8 Shakenya Stoneberg 5/7/20253:25 PM

## 2023-06-30 NOTE — Plan of Care (Signed)
   Problem: Education: Goal: Knowledge of General Education information will improve Description: Including pain rating scale, medication(s)/side effects and non-pharmacologic comfort measures Outcome: Progressing   Problem: Health Behavior/Discharge Planning: Goal: Ability to manage health-related needs will improve Outcome: Progressing   Problem: Clinical Measurements: Goal: Ability to maintain clinical measurements within normal limits will improve Outcome: Progressing Goal: Will remain free from infection Outcome: Progressing Goal: Diagnostic test results will improve Outcome: Progressing Goal: Respiratory complications will improve Outcome: Progressing Goal: Cardiovascular complication will be avoided Outcome: Progressing   Problem: Activity: Goal: Risk for activity intolerance will decrease Outcome: Progressing   Problem: Nutrition: Goal: Adequate nutrition will be maintained Outcome: Progressing   Problem: Coping: Goal: Level of anxiety will decrease Outcome: Progressing   Problem: Elimination: Goal: Will not experience complications related to bowel motility Outcome: Progressing Goal: Will not experience complications related to urinary retention Outcome: Progressing   Problem: Pain Managment: Goal: General experience of comfort will improve and/or be controlled Outcome: Progressing   Problem: Safety: Goal: Ability to remain free from injury will improve Outcome: Progressing   Problem: Skin Integrity: Goal: Risk for impaired skin integrity will decrease Outcome: Progressing   Problem: Education: Goal: Understanding of CV disease, CV risk reduction, and recovery process will improve Outcome: Progressing Goal: Individualized Educational Video(s) Outcome: Progressing   Problem: Activity: Goal: Ability to return to baseline activity level will improve Outcome: Progressing   Problem: Cardiovascular: Goal: Ability to achieve and maintain adequate  cardiovascular perfusion will improve Outcome: Progressing Goal: Vascular access site(s) Level 0-1 will be maintained Outcome: Progressing   Problem: Health Behavior/Discharge Planning: Goal: Ability to safely manage health-related needs after discharge will improve Outcome: Progressing   Problem: Education: Goal: Ability to describe self-care measures that may prevent or decrease complications (Diabetes Survival Skills Education) will improve Outcome: Progressing Goal: Individualized Educational Video(s) Outcome: Progressing   Problem: Coping: Goal: Ability to adjust to condition or change in health will improve Outcome: Progressing   Problem: Fluid Volume: Goal: Ability to maintain a balanced intake and output will improve Outcome: Progressing   Problem: Health Behavior/Discharge Planning: Goal: Ability to identify and utilize available resources and services will improve Outcome: Progressing Goal: Ability to manage health-related needs will improve Outcome: Progressing   Problem: Metabolic: Goal: Ability to maintain appropriate glucose levels will improve Outcome: Progressing   Problem: Nutritional: Goal: Maintenance of adequate nutrition will improve Outcome: Progressing Goal: Progress toward achieving an optimal weight will improve Outcome: Progressing   Problem: Skin Integrity: Goal: Risk for impaired skin integrity will decrease Outcome: Progressing   Problem: Tissue Perfusion: Goal: Adequacy of tissue perfusion will improve Outcome: Progressing

## 2023-07-01 ENCOUNTER — Other Ambulatory Visit: Payer: Self-pay

## 2023-07-01 DIAGNOSIS — I5023 Acute on chronic systolic (congestive) heart failure: Secondary | ICD-10-CM | POA: Diagnosis not present

## 2023-07-01 LAB — BASIC METABOLIC PANEL WITH GFR
Anion gap: 12 (ref 5–15)
BUN: 61 mg/dL — ABNORMAL HIGH (ref 6–20)
CO2: 28 mmol/L (ref 22–32)
Calcium: 8.8 mg/dL — ABNORMAL LOW (ref 8.9–10.3)
Chloride: 93 mmol/L — ABNORMAL LOW (ref 98–111)
Creatinine, Ser: 3.91 mg/dL — ABNORMAL HIGH (ref 0.61–1.24)
GFR, Estimated: 17 mL/min — ABNORMAL LOW (ref >60)
Glucose, Bld: 128 mg/dL — ABNORMAL HIGH (ref 70–99)
Potassium: 3.9 mmol/L (ref 3.5–5.1)
Sodium: 133 mmol/L — ABNORMAL LOW (ref 135–145)

## 2023-07-01 MED ORDER — ISOSORB DINITRATE-HYDRALAZINE 20-37.5 MG PO TABS
1.0000 | ORAL_TABLET | Freq: Three times a day (TID) | ORAL | 0 refills | Status: DC
  Filled 2023-07-01: qty 90, 30d supply, fill #0

## 2023-07-01 MED ORDER — APIXABAN 5 MG PO TABS
5.0000 mg | ORAL_TABLET | Freq: Two times a day (BID) | ORAL | 0 refills | Status: DC
  Filled 2023-07-01: qty 60, 30d supply, fill #0

## 2023-07-01 MED ORDER — TORSEMIDE 20 MG PO TABS
40.0000 mg | ORAL_TABLET | Freq: Every day | ORAL | 0 refills | Status: DC
  Filled 2023-07-01: qty 60, 30d supply, fill #0

## 2023-07-01 NOTE — Progress Notes (Signed)
 [facility]  ROUNDING NOTE   Subjective:   Hector [name]  is a 58 y.o. male with past medical conditions including hypertension, paroxysmal A-fib on Eliquis , CHF with a EF less than 20%, and chronic kidney disease stage IV.  Patient presents to the emergency department complaining of shortness of breath and swelling and has been admitted for Generalized abdominal pain [R10.84] Hypertensive urgency [I16.0] Calculus of gallbladder without cholecystitis without obstruction [K80.20] Urinary tract infection without hematuria, site unspecified [N39.0] Intractable vomiting with nausea [R11.2] Acute on chronic congestive heart failure, unspecified heart failure type (HCC) [I50.9] Nausea and vomiting, unspecified vomiting type [R11.2]  Patient is known to our practice from previous admissions and is currently followed by [facility] nephrology.  Patient states he began having shortness of breath about a week prior to presentation.  States he has been compliant with all medications including diuretics.  Update Patient sitting up in bed Competing breakfast Room air No complaints.   Creatinine 3.91 Urine output  Objective:  Vital signs in last 24 hours:  Temp:  [97.8 F (36.6 C)-98.3 F (36.8 C)] 97.8 F (36.6 C) (05/08 0745) Pulse Rate:  [63-76] 63 (05/08 0745) Resp:  [18-20] 18 (05/08 0341) BP: (114-138)/(58-105) 124/85 (05/08 1100) SpO2:  [89 %-95 %] 95 % (05/08 0745) Weight:  [124 kg] 124 kg (05/08 0516)  Weight change: -0.632 kg Filed Weights   06/29/23 0500 06/30/23 0500 07/01/23 0516  Weight: 126.9 kg 124.6 kg 124 kg    Intake/Output: I/O last 3 completed shifts: In: 1511.2 [P.O.:1440; I.V.:71.2] Out: 3025 [Urine:3025]   Intake/Output this shift:  No intake/output data recorded.  Physical Exam: General: NAD  Head: Normocephalic, atraumatic. Moist oral mucosal membranes  Eyes: Anicteric  Lungs:  Clear to auscultation, normal effort, cough  Heart:  Regular rate and rhythm  Abdomen:  Soft, nontender,   Extremities: No peripheral edema.  Neurologic: Alert and oriented, moving all four extremities  Skin: No lesions  Access: None    Basic Metabolic Panel: Recent Labs  Lab 06/25/23 0520 06/26/23 0500 06/27/23 0530 06/28/23 0500 06/29/23 0534 06/30/23 0520 07/01/23 0515  NA 130* 130* 132* 131* 131* 130* 133*  K 3.6 3.2* 3.5 3.5 3.7 4.0 3.9  CL 89* 88* 89* 91* 93* 92* 93*  CO2 28 27 28 27 28 27 28   GLUCOSE 129* 186* 147* 170* 125* 131* 128*  BUN 58* 64* 69* 64* 61* 58* 61*  CREATININE 4.98* 4.83* 4.87* 4.43* 3.98* 4.05* 3.91*  CALCIUM  8.8* 8.4* 8.8* 8.6* 8.7* 8.9 8.8*  MG 2.3 2.4 2.6* 2.7* 2.7*  --   --   PHOS 4.9* 4.6 4.5 3.9 3.8  --   --     Liver Function Tests: Recent Labs  Lab 06/25/23 0520 06/26/23 0500 06/27/23 0530 06/28/23 0500 06/29/23 0534  AST 21 21 29  32 31  ALT 13 13 13 15 15   ALKPHOS 103 94 91 93 93  BILITOT 1.7* 1.3* 1.1 1.0 1.0  PROT 7.5 6.9 6.9 7.1 7.1  ALBUMIN 3.7 3.5 3.4* 3.4* 3.4*   No results for input(s): "LIPASE", "AMYLASE" in the last 168 hours.  No results for input(s): "AMMONIA" in the last 168 hours.  CBC: Recent Labs  Lab 06/25/23 0520 06/26/23 0500 06/27/23 0530 06/28/23 0500 06/29/23 0534  WBC 8.8 8.3 7.5 7.6 8.2  NEUTROABS 6.3 5.8 5.0 5.4 5.9  HGB 14.4 13.5 13.4 13.4 13.6  HCT 43.7 41.0 40.3 41.0 42.1  MCV 82.6 82.0 81.3 82.2 81.9  PLT 273 257 248 222 258    Cardiac Enzymes: No results for input(s): "CKTOTAL", "CKMB", "CKMBINDEX", "TROPONINI" in the last 168 hours.  BNP: Invalid input(s): "POCBNP"  CBG: Recent Labs  Lab 06/26/23 2048  GLUCAP 197*    Microbiology: Results for orders placed or performed during the hospital encounter of 06/21/23  Urine Culture     Status: None   Collection Time: 06/21/23  5:29 AM   Specimen: Urine, Clean Catch  Result Value Ref Range Status   Specimen Description   Final    URINE, CLEAN CATCH Performed at [facility], [address] [place], [place] [address]    Special Requests   Final    NONE Performed at [facility], [address], [place], [place] [address]    Culture   Final    NO GROWTH Performed at [facility], [address], [place], [place] [address]    Report Status 06/22/2023 FINAL  Final    Coagulation Studies: No results for input(s): "LABPROT", "INR" in the last 72 hours.  Urinalysis: No results for input(s): "COLORURINE", "LABSPEC", "PHURINE", "GLUCOSEU", "HGBUR", "BILIRUBINUR", "KETONESUR", "PROTEINUR", "UROBILINOGEN", "NITRITE", "LEUKOCYTESUR" in the last 72 hours.  Invalid input(s): "APPERANCEUR"    Imaging: No results found.    Medications:      amiodarone   200 mg Oral Daily   apixaban   10 mg Oral BID   Followed by   Cecily Cohen ON 07/02/2023] apixaban   5 mg Oral BID   atorvastatin   80 mg Oral Daily   Chlorhexidine  Gluconate Cloth  6 each Topical Daily   influenza vac split trivalent PF  0.5 mL Intramuscular Tomorrow-1000   insulin  aspart  0-9 Units Subcutaneous TID WC   isosorbide -hydrALAZINE   1 tablet Oral TID   lactulose  10 g Oral BID   levothyroxine   125 mcg Oral QAC breakfast   lipase/protease/amylase  36,000 Units Oral TID AC   sodium chloride  flush  10-40 mL Intracatheter Q12H   sodium chloride  flush  3 mL Intravenous Q12H   sodium chloride  flush  3 mL Intravenous Q12H   torsemide   40 mg Oral Daily   acetaminophen , morphine  injection, ondansetron  **OR** ondansetron  (ZOFRAN ) IV, ondansetron  (ZOFRAN ) IV, mouth rinse, sodium chloride  flush, sodium chloride  flush, sodium chloride  flush  Assessment/ Plan:  Hector [name] is a 58 y.o.  male  with past medical conditions including hypertension, paroxysmal A-fib on Eliquis , CHF with a EF less than 20%, and chronic kidney disease stage IV.  Patient presents to the emergency department complaining of shortness of breath and swelling and has been admitted for Generalized  abdominal pain [R10.84] Hypertensive urgency [I16.0] Calculus of gallbladder without cholecystitis without obstruction [K80.20] Urinary tract infection without hematuria, site unspecified [N39.0] Intractable vomiting with nausea [R11.2] Acute on chronic congestive heart failure, unspecified heart failure type (HCC) [I50.9] Nausea and vomiting, unspecified vomiting type [R11.2]   Acute Kidney Injury on chronic kidney disease stage 4 with baseline creatinine 3.35 and GFR of 20 on 05/06/2023.  Follows with [facility] nephrology. Acute kidney injury secondary to hemodynamic instability and suspected cardiorenal syndrome Patient did undergo right heart cath however contrast is usually not utilized for this.    Creatinine has improved some. Will need to follow up with [facility] nephrology at discharge.   Lab Results  Component Value Date   CREATININE 3.91 (H) 07/01/2023   CREATININE 4.05 (H) 06/30/2023   CREATININE 3.98 (H) 06/29/2023    Intake/Output Summary (Last 24 hours) at  07/01/2023 1515 Last data filed at 07/01/2023 1610 Gross per 24 hour  Intake 240 ml  Output 1675 ml  Net -1435 ml    2.  Chronic systolic heart failure.  TEE echo completed on 01/19/2023 shows EF less than 20%.  Cardiology following and performed right heart cath on 4/30 which showed mixed pulmonary venous/arterial hypertension with low cardiac output and PCWP elevation.  Milrinone   stopped. Remains on amiodarone , isosorbide  and torsemide .   3. Anemia of chronic kidney disease Lab Results  Component Value Date   HGB 13.6 06/29/2023    Hemoglobin within optimal range.    LOS: 9 Lilas Diefendorf 5/8/20253:15 PM

## 2023-07-01 NOTE — Progress Notes (Signed)
 [facility] appointment recommended follow-up within a week of hospital discharge.  Nurse Navigator attempted to schedule this but patient refused appointments offered within that 1 week timeframe.  He did agree to an appointment on 07/12/23 @ 3:45 PM.  Patient scheduled and aware of all appointment details. Encouraged him to call our office with any worsening signs or symptoms of HF or any other questions or concerns between discharge and hospital follow-up so he can be seen at the [facility].   Aaron Aas

## 2023-07-01 NOTE — TOC Transition Note (Signed)
 Transition of Care [facility]) - Discharge Note   Patient Details  Name: Hector [name] MRN: [id number] Date of Birth: 03/11/1965  [facility] CM/SW Contact:  Bray Vickerman C Nikya Busler, RN Phone Number: 07/01/2023, 11:18 AM   Clinical Narrative:    Spoke with patient regarding therapy's recommendation for [facility]. Patient stated he didn't need any additional assistance at home, however may need a taxi.          Patient Goals and CMS Choice            Discharge Placement                       Discharge Plan and Services Additional resources added to the After Visit Summary for                                       Social Drivers of Health (SDOH) Interventions SDOH Screenings   Food Insecurity: No Food Insecurity (06/21/2023)  Housing: Low Risk  (06/21/2023)  Transportation Needs: No Transportation Needs (06/21/2023)  Utilities: Not At Risk (06/21/2023)  Alcohol Screen: Low Risk  (01/19/2023)  Depression (PHQ2-9): Low Risk  (08/19/2021)  Financial Resource Strain: Low Risk  (01/19/2023)  Physical Activity: Inactive (01/04/2019)  Social Connections: Moderately Isolated (01/04/2019)  Stress: No Stress Concern Present (01/04/2019)  Tobacco Use: Medium Risk (06/21/2023)     Readmission Risk Interventions    04/27/2022   11:48 AM 02/24/2022    3:53 PM 12/09/2021    1:09 PM  Readmission Risk Prevention Plan  Transportation Screening Complete Complete Complete  Medication Review Oceanographer) Complete Complete Complete  PCP or Specialist appointment within 3-5 days of discharge Complete  Complete  SW Recovery Care/Counseling Consult Complete Complete Complete  Palliative Care Screening Not Applicable Not Applicable Not Applicable  Skilled Nursing Facility Not Applicable Not Applicable Not Applicable

## 2023-07-01 NOTE — Plan of Care (Signed)
   Problem: Education: Goal: Knowledge of General Education information will improve Description: Including pain rating scale, medication(s)/side effects and non-pharmacologic comfort measures Outcome: Progressing   Problem: Health Behavior/Discharge Planning: Goal: Ability to manage health-related needs will improve Outcome: Progressing   Problem: Clinical Measurements: Goal: Ability to maintain clinical measurements within normal limits will improve Outcome: Progressing Goal: Will remain free from infection Outcome: Progressing Goal: Diagnostic test results will improve Outcome: Progressing Goal: Respiratory complications will improve Outcome: Progressing Goal: Cardiovascular complication will be avoided Outcome: Progressing   Problem: Activity: Goal: Risk for activity intolerance will decrease Outcome: Progressing   Problem: Nutrition: Goal: Adequate nutrition will be maintained Outcome: Progressing   Problem: Coping: Goal: Level of anxiety will decrease Outcome: Progressing   Problem: Elimination: Goal: Will not experience complications related to bowel motility Outcome: Progressing Goal: Will not experience complications related to urinary retention Outcome: Progressing   Problem: Pain Managment: Goal: General experience of comfort will improve and/or be controlled Outcome: Progressing   Problem: Safety: Goal: Ability to remain free from injury will improve Outcome: Progressing   Problem: Skin Integrity: Goal: Risk for impaired skin integrity will decrease Outcome: Progressing   Problem: Education: Goal: Understanding of CV disease, CV risk reduction, and recovery process will improve Outcome: Progressing Goal: Individualized Educational Video(s) Outcome: Progressing   Problem: Activity: Goal: Ability to return to baseline activity level will improve Outcome: Progressing   Problem: Cardiovascular: Goal: Ability to achieve and maintain adequate  cardiovascular perfusion will improve Outcome: Progressing Goal: Vascular access site(s) Level 0-1 will be maintained Outcome: Progressing   Problem: Health Behavior/Discharge Planning: Goal: Ability to safely manage health-related needs after discharge will improve Outcome: Progressing   Problem: Education: Goal: Ability to describe self-care measures that may prevent or decrease complications (Diabetes Survival Skills Education) will improve Outcome: Progressing Goal: Individualized Educational Video(s) Outcome: Progressing   Problem: Coping: Goal: Ability to adjust to condition or change in health will improve Outcome: Progressing   Problem: Fluid Volume: Goal: Ability to maintain a balanced intake and output will improve Outcome: Progressing   Problem: Health Behavior/Discharge Planning: Goal: Ability to identify and utilize available resources and services will improve Outcome: Progressing Goal: Ability to manage health-related needs will improve Outcome: Progressing   Problem: Metabolic: Goal: Ability to maintain appropriate glucose levels will improve Outcome: Progressing   Problem: Nutritional: Goal: Maintenance of adequate nutrition will improve Outcome: Progressing Goal: Progress toward achieving an optimal weight will improve Outcome: Progressing   Problem: Skin Integrity: Goal: Risk for impaired skin integrity will decrease Outcome: Progressing   Problem: Tissue Perfusion: Goal: Adequacy of tissue perfusion will improve Outcome: Progressing

## 2023-07-01 NOTE — Discharge Summary (Signed)
 Physician Discharge Summary   Patient: Hector [name] MRN: [id number] DOB: Mar 27, 1965  Admit date:     06/21/2023  Discharge date: 07/01/23  Discharge Physician: Citlalli Weikel   PCP: Dionicia Frater, MD   Recommendations at discharge:   Take medications as recommended Check daily weights.  Contact your primary care provider if you gain more than 2 pounds in 24 hours or 5 pounds in 1 week Maintain a low-sodium diet and 1.5 L fluid restriction  Discharge Diagnoses: Principal Problem:   End-stage systolic heart failure, acute on chronic (HCC) Active Problems:   Chronic renal impairment, stage 4 (severe) (HCC)   OSA (obstructive sleep apnea)   Type II diabetes mellitus with renal manifestations (HCC)   Hyperbilirubinemia   Essential hypertension   Alcohol use, unspecified, in remission   Gout   Paroxysmal atrial flutter (HCC)   Alcoholic cirrhosis of liver without ascites (HCC)   Intractable vomiting with nausea   NSTEMI (non-ST elevated myocardial infarction) (HCC)  Resolved Problems:   * No resolved hospital problems. *  Hospital Course:  Hector [name] is a 58 y.o. male with medical history significant of heart failure with reduced ejection fraction with EF less than 20%, nonischemic cardiomyopathy, history of alcohol use, paroxysmal atrial fibrillation/flutter on Eliquis , OSA on CPAP, chronic kidney disease stage IV, CVA, hypertension, hyperlipidemia, type 2 diabetes mellitus on oral agents presenting with abdominal pain, acute on chronic HFrEF.  Patient reports progressive shortness of breath over the past 4 to 5 days.  Also with mild generalized abdominal pain.  Noted prior history of longstanding alcohol use.  Patient states he quit roughly 13 years ago.  Noted chronic nonischemic cardiomyopathy.  Reports compliance with diuretic as well as home BP regimen.  Denies any NSAID or high salt intake.  Worsening lower extremity swelling.  Has not been tracking weight  at home.  Denies any tobacco use.  No diarrhea.  Abdominal pain generalized though with predominant right upper quadrant predominance.  P.o. intake has been fairly stable.  No focal hemiparesis or confusion.  Denies any illicit drug use. Presented to the ER afebrile, hemodynamically stable.  Satting well on room air.  White count 7.8, hemoglobin 13.2, platelets 238, creatinine 3.42.  Troponin 90s.  BNP 757.  Assessment and Plan:  End-stage systolic CHF, acute on chronic Nonischemic cardiomyopathy - BNP 756 on arrival - TEE November 2024: EF less than 20% - RHC 4/30: PCWP elevated, mixed pulmonary venous/pulmonary arterial hypertension.  Low cardiac output. - Has been on milrinone , discontinued 06/29/23 - Required IV amiodarone  for A-fib RVR, now back to sinus.  On oral amiodarone  - Cardiorenal syndrome complicating care.  Renal function has rebounded some but remains tenuous - Appreciate cardiology input.  Patient's torsemide  resumed at 40 mg daily - Continue Jardiance  and BiDil  has been increased to 1 tablet from 0.53 times a day - Not a candidate for beta-blocker due to low output heart failure - Currently not a candidate for ICD or advanced cardiac therapies due to poor compliance with medications and CKD stage IV.       Medication nonadherence - Patient demonstrates little interest in his medical care.  Previously nonadherent to outpatient medications - Resistant to ambulation with physical therapy. - Resistant to discussing care management during daily rounds - Unfortunately anticipate patient will return to the hospital after discharge.  Have discussed this with him as well.     Left lower extremity DVT - Currently on loading dose Eliquis  - Will  need indefinite Eliquis  at discharge.  Was meant to be on this medication for A-fib but was not taking consistently. - Have discussed extensively with the patient - Pharmacy has confirmed this medication will be affordable outpatient       Significant deconditioning - Secondary to multiple chronic diseases as above - Appreciate PT input, they recommend home health on discharge     Elevated troponins - Likely secondary to chronic myocardial injury, has been chronically elevated in the past -Suspect type II MI from demand ischemia related to his CHF - Type II NSTEMI - Cardiology following as above   Hypoxia - Secondary to volume overload. - Diuresis as tolerated - Wean off oxygen as tolerated   Nausea, vomiting, abdominal pain Most likely secondary to hepatic congestion - Resolved.   Paroxysmal A-fib - DCCV November 2024 - Atypical flutter with rates into the 130s on 5/3.  Initiated amiodarone  drip.  Better controlled now - Back on p.o. amiodarone  per cardiology and is currently in sinus rhythm - Continue to monitor and replete electrolytes daily.  Trend CMP - Continue to monitor on telemetry.   CKD stage IV - Creatinine baseline near 3.4-3.8. - Complicated by cardiorenal syndrome - Serum creatinine peaked and is currently back to baseline - Patient to follow-up with nephrology as an outpatient    Type 2 diabetes Continue glipizide Maintain consistent carbohydrate diet     History of CVA Hypertension Hyperlipidemia - Continue statin as above   OSA - CPAP at night   Liver cirrhosis Remote history of alcohol use disorder - CMP stable.     BMI 37 Obesity, class II Complicates overall prognosis and care Lifestyle modification and exercise has been discussed with patient in detail     Hyponatremia Appears to be chronic related to volume overload Monitor sodium levels closely        Consultants: Cardiology, nephrology Procedures performed: None Disposition: Home health Diet recommendation:  Discharge Diet Orders (From admission, onward)     Start     Ordered   07/01/23 0000  Diet - low sodium heart healthy        07/01/23 1106           Cardiac and Carb modified diet DISCHARGE  MEDICATION: Allergies as of 07/01/2023       Reactions   Esomeprazole Magnesium  Other (See Comments)   Suspected interstitial nephritis 2018   Egg-derived Products Rash        Medication List     STOP taking these medications    metolazone  2.5 MG tablet Commonly known as: ZAROXOLYN        TAKE these medications    allopurinol  100 MG tablet Commonly known as: ZYLOPRIM  Take 100 mg by mouth daily.   amiodarone  200 MG tablet Commonly known as: PACERONE  Take 1 tablet (200 mg total) by mouth daily.   apixaban  5 MG Tabs tablet Commonly known as: ELIQUIS  Take 1 tablet (5 mg total) by mouth 2 (two) times daily.   atorvastatin  80 MG tablet Commonly known as: LIPITOR  Take 1 tablet (80 mg total) by mouth daily.   BiDil  20-37.5 MG tablet Generic drug: isosorbide -hydrALAZINE  Take 1 tablet by mouth 3 (three) times daily. What changed: See the new instructions.   calcitRIOL 0.25 MCG capsule Commonly known as: ROCALTROL Take 0.25 mcg by mouth 3 (three) times a week.   empagliflozin  10 MG Tabs tablet Commonly known as: Jardiance  Take 1 tablet (10 mg total) by mouth daily.   ferrous sulfate  325 (65  FE) MG EC tablet Take 325 mg by mouth daily with breakfast.   gabapentin  100 MG capsule Commonly known as: NEURONTIN  Take 1 capsule (100 mg total) by mouth 2 (two) times daily.   glipiZIDE 5 MG 24 hr tablet Commonly known as: GLUCOTROL XL Take 5 mg by mouth daily.   levothyroxine  125 MCG tablet Commonly known as: SYNTHROID  Take 125 mcg by mouth daily before breakfast.   lipase/protease/amylase 16109 UNITS Cpep capsule Commonly known as: CREON  Take 1 capsule (36,000 Units total) by mouth 3 (three) times daily before meals.   polyethylene glycol 17 g packet Commonly known as: MIRALAX  / GLYCOLAX  Take 17 g by mouth daily as needed for mild constipation.   torsemide  20 MG tablet Commonly known as: DEMADEX  Take 2 tablets (40 mg total) by mouth daily.        Follow-up  Information     Bender, Lurlean Salmons, MD Follow up.   Specialty: Family Medicine Why: Hospital follow up Contact information: [address] [place] [address] [phone]         Bensimhon, Rheta Celestine, MD Follow up in 1 week(s).   Specialty: Cardiology Contact information: [address] [place] [address] [phone]         Alwin Baars, DO. Go on 07/12/2023.   Specialty: Cardiology Why: Hospital Follow-Up 07/12/23 @ 3:45 PM Please bring all medications to follow-up appointment [facility], [address] Second Floor Free Valet Parking at the door Contact information: [address] [place] [address] [phone]                Discharge Exam: Hector [name] Weights   06/29/23 0500 06/30/23 0500 07/01/23 0516  Weight: 126.9 kg 124.6 kg 124 kg  General exam: Appears calm and comfortable, NAD obese Respiratory system: No work of breathing, symmetric chest wall expansion Cardiovascular system: S1 & S2 heard, RRR.  Gastrointestinal system: Central adiposity, bowel sounds present, soft and nontender.  Neuro: Alert and oriented. No focal neurological deficits. Extremities: No lower extremity edema today Skin: No rashes, lesions Psychiatry: Demonstrates appropriate judgement and insight. Mood & affect appropriate for situation.    Condition at discharge: stable  The results of significant diagnostics from this hospitalization (including imaging, microbiology, ancillary and laboratory) are listed below for reference.   Imaging Studies: US  Venous Img Lower Bilateral (DVT) Result Date: 06/24/2023 CLINICAL DATA:  Bilateral leg edema EXAM: BILATERAL LOWER EXTREMITY VENOUS DOPPLER ULTRASOUND TECHNIQUE: Gray-scale sonography with compression, as well as color and duplex ultrasound, were performed to evaluate the deep venous system(s) from the level of the common femoral vein through the popliteal and proximal  calf veins. COMPARISON:  None Available. FINDINGS: VENOUS Nonocclusive thrombus noted in the left mid to distal femoral vein and left popliteal vein. Patent left common femoral vein and profundus femoral vein and patent left calf veins. No right lower extremity DVT is demonstrated. OTHER None. Limitations: none IMPRESSION: 1. Nonocclusive thrombus in the LEFT mid and distal femoral vein and left popliteal vein compatible with acute DVT. 2. No findings of RIGHT lower extremity DVT. Electronically Signed   By: Freida Jes M.D.   On: 06/24/2023 17:40   US  EKG SITE RITE Result Date: 06/23/2023 If Site Rite image not attached, placement could not be confirmed due to current cardiac rhythm.  CARDIAC CATHETERIZATION Result Date: 06/23/2023 1. PCWP remains elevated. 2. Mixed pulmonary venous/pulmonary arterial HTN (?OSA playing a role). 3. Low cardiac output I will  initiate milrinone  0.25 mcg/kg/min   US  Abdomen Limited RUQ (LIVER/GB) Result Date: 06/21/2023 CLINICAL DATA:  Abdominal pain EXAM: ULTRASOUND ABDOMEN LIMITED RIGHT UPPER QUADRANT COMPARISON:  CT earlier 06/21/2023. MRI August 2024. Ultrasound 04/26/2022 FINDINGS: Gallbladder: Distended gallbladder with stones. No wall thickening or adjacent fluid. No reported sonographic Murphy's sign. Common bile duct: Diameter: 5 mm Liver: Mildly echogenic hepatic parenchyma consistent with fatty liver infiltration. Portal vein is patent on color Doppler imaging with normal direction of blood flow towards the liver. Other: Incidental cyst noted in the right kidney. Please correlate prior CT scan. This measures 3.2 cm today and is anechoic. IMPRESSION: Gallstones. No ductal dilatation or further sonographic evidence acute cholecystitis at this time. Fatty liver infiltration. Electronically Signed   By: Adrianna Horde M.D.   On: 06/21/2023 14:55   CT ABDOMEN PELVIS WO CONTRAST Result Date: 06/21/2023 CLINICAL DATA:  Acute nonlocalized abdominal pain, dyspnea  EXAM: CT ABDOMEN AND PELVIS WITHOUT CONTRAST TECHNIQUE: Multidetector CT imaging of the abdomen and pelvis was performed following the standard protocol without IV contrast. RADIATION DOSE REDUCTION: This exam was performed according to the departmental dose-optimization program which includes automated exposure control, adjustment of the mA and/or kV according to patient size and/or use of iterative reconstruction technique. COMPARISON:  None Available. FINDINGS: Lower chest: No acute abnormality. Moderate coronary artery calcification. Borderline cardiomegaly. Stable right lower lobe nodules benign. Hepatobiliary: Cholelithiasis without superimposed pericholecystic inflammatory change. Liver unremarkable on this noncontrast examination. No intra or extrahepatic biliary ductal dilation. Pancreas: Unremarkable Spleen: Unremarkable Adrenals/Urinary Tract: The adrenal glands are unremarkable. The kidneys are normal in position. Moderate bilateral renal cortical atrophy. Numerous simple and hyperdense renal cortical cysts are seen within the kidneys bilaterally, better assessed on MRI examination of 09/26/2022, for which no follow-up imaging is recommended. The kidneys are otherwise unremarkable. The bladder is partially decompressed. There is, however, mild circumferential bladder wall thickening and a small diverticulum noted within bladder dome which together suggest changes of chronic bladder obstruction. Stomach/Bowel: Moderate pancolonic diverticulosis. Stomach, small bowel, and large bowel are otherwise unremarkable. Appendix normal. No evidence of obstruction or focal inflammation. No free intraperitoneal gas or fluid. Vascular/Lymphatic: Aortic atherosclerosis. No enlarged abdominal or pelvic lymph nodes. Reproductive: Prostate is unremarkable. Other: Tiny fat containing umbilical hernia Musculoskeletal: No acute bone abnormality. No lytic or blastic bone lesion. Osseous structures are age appropriate.  IMPRESSION: 1. No acute intra-abdominal pathology identified. 2. Cholelithiasis. 3. Moderate pancolonic diverticulosis. Aortic Atherosclerosis (ICD10-I70.0). Electronically Signed   By: Worthy Heads M.D.   On: 06/21/2023 03:58   DG Chest Port 1 View Result Date: 06/21/2023 CLINICAL DATA:  CHF, fluid retention EXAM: PORTABLE CHEST 1 VIEW COMPARISON:  01/16/2023 FINDINGS: Cardiomegaly with mild interstitial edema. Suspected small bilateral pleural effusions. No pneumothorax. IMPRESSION: Cardiomegaly with mild interstitial edema. Suspected small bilateral pleural effusions. Electronically Signed   By: Zadie Herter M.D.   On: 06/21/2023 03:15    Microbiology: Results for orders placed or performed during the hospital encounter of 06/21/23  Urine Culture     Status: None   Collection Time: 06/21/23  5:29 AM   Specimen: Urine, Clean Catch  Result Value Ref Range Status   Specimen Description   Final    URINE, CLEAN CATCH Performed at [facility], [address] [place], [place] [address]    Special Requests   Final    NONE Performed at [facility], [address] [place], [place] [address]    Culture   Final  NO GROWTH Performed at [facility], [address] [place], [place] [address]    Report Status 06/22/2023 FINAL  Final    Labs: CBC: Recent Labs  Lab 06/25/23 0520 06/26/23 0500 06/27/23 0530 06/28/23 0500 06/29/23 0534  WBC 8.8 8.3 7.5 7.6 8.2  NEUTROABS 6.3 5.8 5.0 5.4 5.9  HGB 14.4 13.5 13.4 13.4 13.6  HCT 43.7 41.0 40.3 41.0 42.1  MCV 82.6 82.0 81.3 82.2 81.9  PLT 273 257 248 222 258   Basic Metabolic Panel: Recent Labs  Lab 06/25/23 0520 06/26/23 0500 06/27/23 0530 06/28/23 0500 06/29/23 0534 06/30/23 0520 07/01/23 0515  NA 130* 130* 132* 131* 131* 130* 133*  K 3.6 3.2* 3.5 3.5 3.7 4.0 3.9  CL 89* 88* 89* 91* 93* 92* 93*  CO2 28 27 28 27 28 27 28   GLUCOSE 129* 186* 147* 170* 125* 131* 128*  BUN 58* 64* 69* 64* 61*  58* 61*  CREATININE 4.98* 4.83* 4.87* 4.43* 3.98* 4.05* 3.91*  CALCIUM  8.8* 8.4* 8.8* 8.6* 8.7* 8.9 8.8*  MG 2.3 2.4 2.6* 2.7* 2.7*  --   --   PHOS 4.9* 4.6 4.5 3.9 3.8  --   --    Liver Function Tests: Recent Labs  Lab 06/25/23 0520 06/26/23 0500 06/27/23 0530 06/28/23 0500 06/29/23 0534  AST 21 21 29  32 31  ALT 13 13 13 15 15   ALKPHOS 103 94 91 93 93  BILITOT 1.7* 1.3* 1.1 1.0 1.0  PROT 7.5 6.9 6.9 7.1 7.1  ALBUMIN 3.7 3.5 3.4* 3.4* 3.4*   CBG: Recent Labs  Lab 06/26/23 2048  GLUCAP 197*    Discharge time spent: greater than 30 minutes.  Signed: Read Camel, MD [facility] 07/01/2023

## 2023-07-09 ENCOUNTER — Telehealth: Payer: Self-pay | Admitting: Cardiology

## 2023-07-09 NOTE — Telephone Encounter (Signed)
 Called to confirm/remind patient of their appointment at [facility] on 07/12/23.   Appointment:   [] Confirmed  [x] Left mess   [] No answer/No voice mail  [] VM Full/unable to leave message  [] Phone not in service  Patient reminded to bring all medications and/or complete list.  Confirmed patient has transportation. Gave directions, instructed to utilize valet parking.

## 2023-07-12 ENCOUNTER — Encounter: Admitting: Cardiology

## 2023-07-12 ENCOUNTER — Telehealth: Payer: Self-pay | Admitting: Family

## 2023-07-12 NOTE — Telephone Encounter (Signed)
 Called to confirm/remind patient of their appointment at [facility] on 07/13/23.   Appointment:   [] Confirmed  [x] Left mess   [] No answer/No voice mail  [] VM Full/unable to leave message  [] Phone not in service  Patient reminded to bring all medications and/or complete list.  Confirmed patient has transportation. Gave directions, instructed to utilize valet parking.

## 2023-07-13 ENCOUNTER — Ambulatory Visit: Attending: Cardiology | Admitting: Cardiology

## 2023-07-13 VITALS — BP 156/103 | HR 69 | Wt 296.0 lb

## 2023-07-13 DIAGNOSIS — I5042 Chronic combined systolic (congestive) and diastolic (congestive) heart failure: Secondary | ICD-10-CM | POA: Insufficient documentation

## 2023-07-13 NOTE — Progress Notes (Unsigned)
 ADVANCED HF CLINIC NOTE   Primary Care: Dionicia Frater, MD Primary Cardiologist: Belva Boyden, MD (last seen 04/24) HP provider: Jules Oar, MD (last seen 04/24)   HPI:   [name] is a 58 yo male with ETOH use, DM2, morbid obesity, PAF (h/o GIB on apixaban ),  OSA on CPAP, CKD stage IV (Scr ~3.3), medication noncompliance.    HF dates back to at least 2009, thought to presumably be nonischemic in the setting of alcohol use, with an EF of 20%.     Echo 2016 EF 20-25%. LHC 12/2014 minimal CAD   EF initially improved with restoration of NSR but then dropped back down    He underwent TEE/DCCV in 02/2021 at that time showed an EF of less than 20% with severely reduced RV systolic function.  He was reevaluated by EP in 04/2021 and felt not to be a good candidate for ICD or ablation given medication noncompliance.   Admitted in 9/23 & 10/23 with ADHF. Limited echo 10/23 EF <  20% with moderate RV dysfunction and moderate to sever MR.  He was discharged from the hospital on 12/12/2021 with a weight of 247 pounds.     Admitted 11/23, 12/23, 1/24 and 3/24 with recurrent HF.   In 3/24 underwent TEE-guided DCCV as well as RHC. TEE EF < 20%, mod RV HK, mod MR. RHC with markedly elevated filling pressures and low CO  RA 17 PA 58/39 (43) PCWP 33 Fick 3.4/1.5 (PA sat 49%) PAPi 1.2   Seen in ER 05/18/22 with recurrent volume overload. Lasix  switched to torsemide  and metolazone  added.  Admitted 01/16/23 due to abdominal pain and found to be tachycardic. CT scan of the abdomen without contrast did not find any findings in the abdomen or pelvis to explain the patient's abdominal pain, cholelithiasis, renal cysts. Worsening renal function so diuretic held and IVF given. Cardiology and nephrology consults done. Was in atrial flutter with successful cardioversion. Given amiodarone .   Echo 01/19/23: EF <20% with mild MR, no thrombus  He presents today for a HF f/u visit with a chief  complaint of minimal fatigue with moderate exertion. Chronic in nature. Has associated gradual weight gain due to "eating too much". Denies shortness of breath, chest pain, cough, palpitations, abdominal distention, pedal edema, dizziness or difficulty sleeping. Overall he says that he's feeling "much better"  Has CPAP but hasn't worn it in "forever". Has not taken any of his medications yet today because he didn't want to be in the bathroom all the time. (It's 2:30pm)   Past Medical History:  Diagnosis Date   Alcohol abuse    Alcoholic cardiomyopathy (HCC)    a. 12/2007 MV: EF 28%, no isch;  b. 8/12 Echo: EF 25-35%; c. 02/2014 Echo: EF 20-25%; d. 12/2014 Cath: minimal CAD; e. 01/2015 Echo: EF 50-55%;  d. 05/2016 Echo: EF 30-35%, diff HK, gr2 DD; e. 11/2016 Echo: EF 45-50%, diff HK; f. 09/2021 Echo: EF <20%; g. 11/2021 Echo: EF<20%.   Chronic combined systolic (congestive) and diastolic (congestive) heart failure (HCC)    a. 05/2016 Echo: EF 30-35%; b. 11/2016 Echo: EF 45-50%, diff HK; c. 09/2021 Echo: EF < 20%; d. 11/2021 Echo: EF <20%, no rwma, Nl RV fxn, sev dil LA, mod-sev MR. mod dil PA w/ mildly elev PASP.   CKD (chronic kidney disease), stage III (HCC)    Elevated troponin (chronic)    Essential hypertension    GI bleed 11/2013   Hyperlipidemia    Pancreatitis  Paroxysmal A-fib (HCC)    a. new onset s/p unsuccessful TEE/DCCV on 08/16/2014; b. CHA2DS2VASc = 3-> eliquis  (freq noncompliant); c. 02/2021 s/p TEE/DCCV; d. Prev on amio->d/c 2/2 abnl TFTs.   Paroxysmal atrial flutter (HCC)    Sleep-disordered breathing    Has yet to have a sleep study   Stroke [facility])     Current Outpatient Medications  Medication Sig Dispense Refill   allopurinol  (ZYLOPRIM ) 100 MG tablet Take 100 mg by mouth daily.     amiodarone  (PACERONE ) 200 MG tablet Take 1 tablet (200 mg total) by mouth daily. 90 tablet 3   apixaban  (ELIQUIS ) 5 MG TABS tablet Take 1 tablet (5 mg total) by mouth 2 (two) times daily. 60  tablet 0   atorvastatin  (LIPITOR ) 80 MG tablet Take 1 tablet (80 mg total) by mouth daily. 90 tablet 11   calcitRIOL (ROCALTROL) 0.25 MCG capsule Take 0.25 mcg by mouth 3 (three) times a week.     empagliflozin  (JARDIANCE ) 10 MG TABS tablet Take 1 tablet (10 mg total) by mouth daily. 30 tablet 5   ferrous sulfate  325 (65 FE) MG EC tablet Take 325 mg by mouth daily with breakfast.     gabapentin  (NEURONTIN ) 100 MG capsule Take 1 capsule (100 mg total) by mouth 2 (two) times daily. 60 capsule 0   glipiZIDE (GLUCOTROL XL) 5 MG 24 hr tablet Take 5 mg by mouth daily.     isosorbide -hydrALAZINE  (BIDIL ) 20-37.5 MG tablet Take 1 tablet by mouth 3 (three) times daily. 90 tablet 0   levothyroxine  (SYNTHROID ) 125 MCG tablet Take 125 mcg by mouth daily before breakfast.     lipase/protease/amylase (CREON ) 36000 UNITS CPEP capsule Take 1 capsule (36,000 Units total) by mouth 3 (three) times daily before meals. 180 capsule 0   polyethylene glycol (MIRALAX  / GLYCOLAX ) 17 g packet Take 17 g by mouth daily as needed for mild constipation. 30 each 0   torsemide  (DEMADEX ) 20 MG tablet Take 2 tablets (40 mg total) by mouth daily. 60 tablet 0   No current facility-administered medications for this visit.    Allergies  Allergen Reactions   Esomeprazole Magnesium  Other (See Comments)    Suspected interstitial nephritis 2018   Egg-Derived Products Rash      Social History   Socioeconomic History   Marital status: Single    Spouse name: Not on file   Number of children: Not on file   Years of education: Not on file   Highest education level: Not on file  Occupational History   Not on file  Tobacco Use   Smoking status: Former    Current packs/day: 0.00    Average packs/day: 1 pack/day for 12.0 years (12.0 ttl pk-yrs)    Types: Cigarettes    Start date: 02/23/1986    Quit date: 02/23/1998    Years since quitting: 25.4   Smokeless tobacco: Never  Vaping Use   Vaping status: Never Used  Substance and  Sexual Activity   Alcohol use: Not Currently    Alcohol/week: 0.0 standard drinks of alcohol    Comment: Past heavy drinker   Drug use: No   Sexual activity: Not Currently  Other Topics Concern   Not on file  Social History Narrative   Not on file   Social Drivers of Health   Financial Resource Strain: Low Risk  (01/19/2023)   Overall Financial Resource Strain (CARDIA)    Difficulty of Paying Living Expenses: Not hard at all  Food Insecurity: No  Food Insecurity (06/21/2023)   Hunger Vital Sign    Worried About Running Out of Food in the Last Year: Never true    Ran Out of Food in the Last Year: Never true  Transportation Needs: No Transportation Needs (06/21/2023)   PRAPARE - Administrator, Civil Service (Medical): No    Lack of Transportation (Non-Medical): No  Physical Activity: Inactive (01/04/2019)   Exercise Vital Sign    Days of Exercise per Week: 0 days    Minutes of Exercise per Session: 0 min  Stress: No Stress Concern Present (01/04/2019)   [employer] of Occupational Health - Occupational Stress Questionnaire    Feeling of Stress : Not at all  Social Connections: Moderately Isolated (01/04/2019)   Social Connection and Isolation Panel [NHANES]    Frequency of Communication with Friends and Family: More than three times a week    Frequency of Social Gatherings with Friends and Family: More than three times a week    Attends Religious Services: 1 to 4 times per year    Active Member of [employer] or Organizations: No    Attends Banker Meetings: Never    Marital Status: Never married  Intimate Partner Violence: Not At Risk (06/21/2023)   Humiliation, Afraid, Rape, and Kick questionnaire    Fear of Current or Ex-Partner: No    Emotionally Abused: No    Physically Abused: No    Sexually Abused: No      Family History  Problem Relation Age of Onset   Hypertension Mother    Hyperlipidemia Mother    Diabetes Mother    Vitals:    07/13/23 1518  BP: (!) 156/103  Pulse: 69  SpO2: 99%  Weight: 296 lb (134.3 kg)   Wt Readings from Last 3 Encounters:  07/13/23 296 lb (134.3 kg)  07/01/23 273 lb 4.8 oz (124 kg)  01/27/23 282 lb (127.9 kg)   Lab Results  Component Value Date   CREATININE 3.91 (H) 07/01/2023   CREATININE 4.05 (H) 06/30/2023   CREATININE 3.98 (H) 06/29/2023     PHYSICAL EXAM: General:  Sitting in chair  No resp difficulty HEENT: normal Neck: supple. no JVD. No lymphadenopathy or thryomegaly appreciated. Cor: PMI laterally displaced. Regular rhythm, bradycardic. No rubs, gallops or murmurs. Lungs: clear Abdomen: soft, nontender, nondistended. No hepatosplenomegaly. No bruits or masses.  Extremities: no cyanosis, clubbing, rash, edema Neuro: alert & orientedx 3, cranial nerves grossly intact. moves all 4 extremities w/o difficulty. Affect pleasant  ECG: SB, HR 56 (personally reviewed)   ASSESSMENT & PLAN:  1. 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 Has been seen by EP and not felt to be candidate for ablation.  - NYHA class II - euvolemic - has scales, not weighing and implies that he won't start; encouraged him to weigh every morning and call for overnight weight gain of > 2 pounds or a weekly weight gain of > 5 pounds - weight up 26 pounds from last visit here 6 months ago; says that he's eating "too much and all the time" - LHC 2016 minimal CAD - Limited echocardiogram 10/23. LVEF<20% RV moderately down moderate to severe MR - Has had multiple hospitalizations for heart failure exacerbation likely secondary to noncompliance and end-stage CM  -Patient was previously deemed not to be ICD insertion by EP due to noncompliance - Echo 3/24 EF <  20% Moderate RV dysfunction - RHC 3/24 RA 17 PA 58/39 (43) PCWP 33 Fick 3.4/1.5 (PA sat 49%) PAPi 1.2  - Echo  01/19/23: EF <20% with mild MR, no thrombus - continue Jardiance  10mg  daily - BP 151/103 but he hasn't taken any of his meds yet today (it's 2:30pm); emphasized the importance of taking his meds prior to clinic visits so BP can accurately be assessed - continue Bidil  1/2 tablet TID - continue torsemide  40mg  daily - No ACE inhibitor/ARB/ARNI/MRA secondary to stage IV CKD - No b-blocker with low output on cath - Given CKD 4, tobacco use and obesity he is not candidate for advanced HF therapies - He has end-stage HF with poor insight into his illness. Not interested in Palliative Care referral - saw HF provider (Bensimhon) 04/24 - BMET/ Mg today - BNP 06/17/22 was 897.4  2. Paroxysmal atrial fibrillation/atrial flutter- - continue apixaban  5 mg twice daily for CHA2DS2-VASc score of at least 6 - continue amio 200mg  daily although he doesn't have his bottle with him; he will check on this after returning home & staff will f/u to make sure he has it - TEE/DC-CV 3/24  - Last DC-CV 11/24 - Previously seen by EP -> not candidate for ablation - EKG SB   CKD stage IV- - Baseline Scr 2.8-3.4 - BMP 01/19/23 showed sodium 134, potassium 4.0, creatinine 4.53 & GFR 14 - BMET today - Avoid nephrotoxic agents where able - Not HD candidate with severe HF - saw nephrology Abigail Abler) 11/24   4. Nonobstructive coronary artery disease- - Cath 2016 - saw cardiology (Dunn) 04/24 - continue atorvastatin  80mg  daily - LDL 01/09/22 was 88   5. Obstructive sleep apnea- - does not wear CPAP and says that he hasn't worn it in "forever" and not interested in trying to wear it at this time - last sleep study done 12/2015   6. Type 2 diabetes- -A1c 7.7% on 01/17/23 -Continue SGLT2i -Per PCP   Return in 6 weeks, sooner if needed.

## 2023-07-13 NOTE — Patient Instructions (Signed)
 Medication Changes:  FOR 1 WEEK ONLY increase Torsemide  60mg  (3 tabs) daily. THEN RESUME 40mg  (2 tabs) daily  Lab Work:  Go DOWN to LOWER LEVEL (LL) to have your blood work completed inside of [employer] office.   We will only call you if the results are abnormal or if the provider would like to make medication changes.   Follow-Up in: Please follow up with the [facility] in 3 weeks with Shawnee Dellen, FNP.  At the [facility], you and your health needs are our priority. We have a designated team specialized in the treatment of Heart Failure. This Care Team includes your primary Heart Failure Specialized Cardiologist (physician), Advanced Practice Providers (APPs- Physician Assistants and Nurse Practitioners), and Pharmacist who all work together to provide you with the care you need, when you need it.   You may see any of the following providers on your designated Care Team at your next follow up:  Dr. Jules Oar Dr. Peder Bourdon Dr. Alwin Baars Dr. Judyth Nunnery Shawnee Dellen, FNP Bevely Brush, RPH-CPP  Please be sure to bring in all your medications bottles to every appointment.   Need to Contact Us :  If you have any questions or concerns before your next appointment please send us  a message through [employer] or call our office at [phone].    TO LEAVE A MESSAGE FOR THE NURSE SELECT OPTION 2, PLEASE LEAVE A MESSAGE INCLUDING: YOUR NAME DATE OF BIRTH CALL BACK NUMBER REASON FOR CALL**this is important as we prioritize the call backs  YOU WILL RECEIVE A CALL BACK THE SAME DAY AS LONG AS YOU CALL BEFORE 4:00 PM

## 2023-07-14 LAB — BASIC METABOLIC PANEL WITH GFR
BUN/Creatinine Ratio: 12 (ref 9–20)
BUN: 39 mg/dL — ABNORMAL HIGH (ref 6–24)
CO2: 20 mmol/L (ref 20–29)
Calcium: 8.9 mg/dL (ref 8.7–10.2)
Chloride: 103 mmol/L (ref 96–106)
Creatinine, Ser: 3.16 mg/dL — ABNORMAL HIGH (ref 0.76–1.27)
Glucose: 63 mg/dL — ABNORMAL LOW (ref 70–99)
Potassium: 3.7 mmol/L (ref 3.5–5.2)
Sodium: 140 mmol/L (ref 134–144)
eGFR: 22 mL/min/{1.73_m2} — ABNORMAL LOW (ref >59)

## 2023-07-14 LAB — BRAIN NATRIURETIC PEPTIDE: BNP: 531.8 pg/mL — ABNORMAL HIGH (ref 0.0–100.0)

## 2023-07-28 ENCOUNTER — Inpatient Hospital Stay

## 2023-07-28 ENCOUNTER — Emergency Department

## 2023-07-28 ENCOUNTER — Inpatient Hospital Stay
Admission: EM | Admit: 2023-07-28 | Discharge: 2023-08-02 | DRG: 308 | Disposition: A | Discharge status: Home / Self Care | Attending: Obstetrics and Gynecology | Admitting: Obstetrics and Gynecology

## 2023-07-28 ENCOUNTER — Other Ambulatory Visit: Payer: Self-pay

## 2023-07-28 DIAGNOSIS — J069 Acute upper respiratory infection, unspecified: Secondary | ICD-10-CM | POA: Diagnosis present

## 2023-07-28 DIAGNOSIS — Z83438 Family history of other disorder of lipoprotein metabolism and other lipidemia: Secondary | ICD-10-CM

## 2023-07-28 DIAGNOSIS — I5084 End stage heart failure: Secondary | ICD-10-CM | POA: Diagnosis present

## 2023-07-28 DIAGNOSIS — E119 Type 2 diabetes mellitus without complications: Secondary | ICD-10-CM

## 2023-07-28 DIAGNOSIS — J810 Acute pulmonary edema: Secondary | ICD-10-CM | POA: Diagnosis present

## 2023-07-28 DIAGNOSIS — E785 Hyperlipidemia, unspecified: Secondary | ICD-10-CM | POA: Diagnosis present

## 2023-07-28 DIAGNOSIS — E7800 Pure hypercholesterolemia, unspecified: Secondary | ICD-10-CM | POA: Diagnosis not present

## 2023-07-28 DIAGNOSIS — Z888 Allergy status to other drugs, medicaments and biological substances status: Secondary | ICD-10-CM

## 2023-07-28 DIAGNOSIS — I480 Paroxysmal atrial fibrillation: Secondary | ICD-10-CM | POA: Diagnosis present

## 2023-07-28 DIAGNOSIS — I5043 Acute on chronic combined systolic (congestive) and diastolic (congestive) heart failure: Secondary | ICD-10-CM | POA: Diagnosis present

## 2023-07-28 DIAGNOSIS — I2699 Other pulmonary embolism without acute cor pulmonale: Secondary | ICD-10-CM | POA: Diagnosis present

## 2023-07-28 DIAGNOSIS — R000 Tachycardia, unspecified: Secondary | ICD-10-CM | POA: Diagnosis present

## 2023-07-28 DIAGNOSIS — S0083XA Contusion of other part of head, initial encounter: Secondary | ICD-10-CM | POA: Diagnosis present

## 2023-07-28 DIAGNOSIS — E039 Hypothyroidism, unspecified: Secondary | ICD-10-CM | POA: Diagnosis present

## 2023-07-28 DIAGNOSIS — J9601 Acute respiratory failure with hypoxia: Secondary | ICD-10-CM | POA: Diagnosis present

## 2023-07-28 DIAGNOSIS — N179 Acute kidney failure, unspecified: Secondary | ICD-10-CM | POA: Diagnosis present

## 2023-07-28 DIAGNOSIS — Z66 Do not resuscitate: Secondary | ICD-10-CM | POA: Diagnosis present

## 2023-07-28 DIAGNOSIS — G4733 Obstructive sleep apnea (adult) (pediatric): Secondary | ICD-10-CM | POA: Diagnosis not present

## 2023-07-28 DIAGNOSIS — R55 Syncope and collapse: Secondary | ICD-10-CM | POA: Diagnosis present

## 2023-07-28 DIAGNOSIS — E869 Volume depletion, unspecified: Secondary | ICD-10-CM | POA: Diagnosis present

## 2023-07-28 DIAGNOSIS — F1091 Alcohol use, unspecified, in remission: Secondary | ICD-10-CM | POA: Diagnosis present

## 2023-07-28 DIAGNOSIS — Z8249 Family history of ischemic heart disease and other diseases of the circulatory system: Secondary | ICD-10-CM

## 2023-07-28 DIAGNOSIS — W1830XA Fall on same level, unspecified, initial encounter: Secondary | ICD-10-CM | POA: Diagnosis present

## 2023-07-28 DIAGNOSIS — Z1152 Encounter for screening for COVID-19: Secondary | ICD-10-CM

## 2023-07-28 DIAGNOSIS — I5082 Biventricular heart failure: Secondary | ICD-10-CM | POA: Diagnosis present

## 2023-07-28 DIAGNOSIS — Z87891 Personal history of nicotine dependence: Secondary | ICD-10-CM

## 2023-07-28 DIAGNOSIS — E1122 Type 2 diabetes mellitus with diabetic chronic kidney disease: Secondary | ICD-10-CM | POA: Diagnosis present

## 2023-07-28 DIAGNOSIS — I426 Alcoholic cardiomyopathy: Secondary | ICD-10-CM | POA: Diagnosis present

## 2023-07-28 DIAGNOSIS — Z91128 Patient's intentional underdosing of medication regimen for other reason: Secondary | ICD-10-CM

## 2023-07-28 DIAGNOSIS — E032 Hypothyroidism due to medicaments and other exogenous substances: Secondary | ICD-10-CM

## 2023-07-28 DIAGNOSIS — I2510 Atherosclerotic heart disease of native coronary artery without angina pectoris: Secondary | ICD-10-CM | POA: Diagnosis not present

## 2023-07-28 DIAGNOSIS — Z833 Family history of diabetes mellitus: Secondary | ICD-10-CM

## 2023-07-28 DIAGNOSIS — I4891 Unspecified atrial fibrillation: Secondary | ICD-10-CM | POA: Diagnosis present

## 2023-07-28 DIAGNOSIS — N184 Chronic kidney disease, stage 4 (severe): Secondary | ICD-10-CM | POA: Diagnosis present

## 2023-07-28 DIAGNOSIS — Z79899 Other long term (current) drug therapy: Secondary | ICD-10-CM

## 2023-07-28 DIAGNOSIS — N2889 Other specified disorders of kidney and ureter: Secondary | ICD-10-CM | POA: Diagnosis present

## 2023-07-28 DIAGNOSIS — B9729 Other coronavirus as the cause of diseases classified elsewhere: Secondary | ICD-10-CM | POA: Diagnosis present

## 2023-07-28 DIAGNOSIS — Z6841 Body Mass Index (BMI) 40.0 and over, adult: Secondary | ICD-10-CM | POA: Diagnosis not present

## 2023-07-28 DIAGNOSIS — I130 Hypertensive heart and chronic kidney disease with heart failure and stage 1 through stage 4 chronic kidney disease, or unspecified chronic kidney disease: Secondary | ICD-10-CM | POA: Diagnosis present

## 2023-07-28 DIAGNOSIS — I5023 Acute on chronic systolic (congestive) heart failure: Secondary | ICD-10-CM | POA: Diagnosis present

## 2023-07-28 DIAGNOSIS — I4892 Unspecified atrial flutter: Principal | ICD-10-CM | POA: Diagnosis present

## 2023-07-28 DIAGNOSIS — I2609 Other pulmonary embolism with acute cor pulmonale: Secondary | ICD-10-CM | POA: Diagnosis not present

## 2023-07-28 DIAGNOSIS — I447 Left bundle-branch block, unspecified: Secondary | ICD-10-CM | POA: Diagnosis present

## 2023-07-28 DIAGNOSIS — Z91012 Allergy to eggs: Secondary | ICD-10-CM

## 2023-07-28 DIAGNOSIS — Z7984 Long term (current) use of oral hypoglycemic drugs: Secondary | ICD-10-CM

## 2023-07-28 DIAGNOSIS — J441 Chronic obstructive pulmonary disease with (acute) exacerbation: Secondary | ICD-10-CM | POA: Diagnosis present

## 2023-07-28 DIAGNOSIS — Z7901 Long term (current) use of anticoagulants: Secondary | ICD-10-CM

## 2023-07-28 DIAGNOSIS — Z91148 Patient's other noncompliance with medication regimen for other reason: Secondary | ICD-10-CM

## 2023-07-28 DIAGNOSIS — Z8673 Personal history of transient ischemic attack (TIA), and cerebral infarction without residual deficits: Secondary | ICD-10-CM

## 2023-07-28 DIAGNOSIS — I2583 Coronary atherosclerosis due to lipid rich plaque: Secondary | ICD-10-CM | POA: Diagnosis not present

## 2023-07-28 DIAGNOSIS — Z86718 Personal history of other venous thrombosis and embolism: Secondary | ICD-10-CM

## 2023-07-28 DIAGNOSIS — Z79890 Hormone replacement therapy (postmenopausal): Secondary | ICD-10-CM

## 2023-07-28 LAB — BASIC METABOLIC PANEL WITH GFR
Anion gap: 8 (ref 5–15)
BUN: 44 mg/dL — ABNORMAL HIGH (ref 6–20)
CO2: 22 mmol/L (ref 22–32)
Calcium: 8.2 mg/dL — ABNORMAL LOW (ref 8.9–10.3)
Chloride: 106 mmol/L (ref 98–111)
Creatinine, Ser: 3.55 mg/dL — ABNORMAL HIGH (ref 0.61–1.24)
GFR, Estimated: 19 mL/min — ABNORMAL LOW (ref >60)
Glucose, Bld: 127 mg/dL — ABNORMAL HIGH (ref 70–99)
Potassium: 4 mmol/L (ref 3.5–5.1)
Sodium: 136 mmol/L (ref 135–145)

## 2023-07-28 LAB — PROTIME-INR
INR: 1.4 — ABNORMAL HIGH (ref 0.8–1.2)
Prothrombin Time: 17.6 s — ABNORMAL HIGH (ref 11.4–15.2)

## 2023-07-28 LAB — CBC
HCT: 41.4 % (ref 39.0–52.0)
Hemoglobin: 12.9 g/dL — ABNORMAL LOW (ref 13.0–17.0)
MCH: 26.3 pg (ref 26.0–34.0)
MCHC: 31.2 g/dL (ref 30.0–36.0)
MCV: 84.3 fL (ref 80.0–100.0)
Platelets: 221 10*3/uL (ref 150–400)
RBC: 4.91 MIL/uL (ref 4.22–5.81)
RDW: 17.6 % — ABNORMAL HIGH (ref 11.5–15.5)
WBC: 8.5 10*3/uL (ref 4.0–10.5)
nRBC: 0 % (ref 0.0–0.2)

## 2023-07-28 LAB — LACTIC ACID, PLASMA: Lactic Acid, Venous: 1.2 mmol/L (ref 0.5–1.9)

## 2023-07-28 LAB — TROPONIN I (HIGH SENSITIVITY)
Troponin I (High Sensitivity): 74 ng/L — ABNORMAL HIGH (ref <18)
Troponin I (High Sensitivity): 110 ng/L (ref <18)

## 2023-07-28 LAB — CBG MONITORING, ED: Glucose-Capillary: 142 mg/dL — ABNORMAL HIGH (ref 70–99)

## 2023-07-28 LAB — BRAIN NATRIURETIC PEPTIDE: B Natriuretic Peptide: 826.8 pg/mL — ABNORMAL HIGH (ref 0.0–100.0)

## 2023-07-28 LAB — TSH: TSH: 39.399 u[IU]/mL — ABNORMAL HIGH (ref 0.350–4.500)

## 2023-07-28 LAB — PROCALCITONIN: Procalcitonin: 0.17 ng/mL

## 2023-07-28 MED ORDER — SODIUM CHLORIDE 0.9 % IV SOLN: 250.0000 mL | INTRAVENOUS | Status: AC | PRN

## 2023-07-28 MED ORDER — PREDNISONE 20 MG PO TABS
40.0000 mg | ORAL_TABLET | Freq: Every day | ORAL | Status: AC
  Administered 2023-07-28 – 2023-08-01 (×5): 40 mg via ORAL
  Filled 2023-07-28 (×4): qty 2
  Filled 2023-07-28: qty 4

## 2023-07-28 MED ORDER — IPRATROPIUM-ALBUTEROL 0.5-2.5 (3) MG/3ML IN SOLN
3.0000 mL | Freq: Two times a day (BID) | RESPIRATORY_TRACT | Status: DC
  Administered 2023-07-29 – 2023-08-02 (×8): 3 mL via RESPIRATORY_TRACT
  Filled 2023-07-28 (×8): qty 3

## 2023-07-28 MED ORDER — FUROSEMIDE 10 MG/ML IJ SOLN
40.0000 mg | Freq: Once | INTRAMUSCULAR | Status: AC
  Administered 2023-07-28: 40 mg via INTRAVENOUS
  Filled 2023-07-28: qty 4

## 2023-07-28 MED ORDER — ACETAMINOPHEN 500 MG PO TABS
500.0000 mg | ORAL_TABLET | Freq: Once | ORAL | Status: DC
  Filled 2023-07-28: qty 1

## 2023-07-28 MED ORDER — ACETAMINOPHEN 325 MG PO TABS
650.0000 mg | ORAL_TABLET | ORAL | Status: DC | PRN
  Administered 2023-07-28: 650 mg via ORAL
  Filled 2023-07-28: qty 2

## 2023-07-28 MED ORDER — AMIODARONE LOAD VIA INFUSION
150.0000 mg | Freq: Once | INTRAVENOUS | Status: AC
  Administered 2023-07-28: 150 mg via INTRAVENOUS
  Filled 2023-07-28: qty 83.34

## 2023-07-28 MED ORDER — SODIUM CHLORIDE 0.9% FLUSH: 3.0000 mL | INTRAVENOUS | Status: DC | PRN

## 2023-07-28 MED ORDER — ATORVASTATIN CALCIUM 80 MG PO TABS
80.0000 mg | ORAL_TABLET | Freq: Every day | ORAL | Status: DC
  Administered 2023-07-29 – 2023-08-02 (×5): 80 mg via ORAL
  Filled 2023-07-28 (×5): qty 1

## 2023-07-28 MED ORDER — AMIODARONE HCL IN DEXTROSE 360-4.14 MG/200ML-% IV SOLN
30.0000 mg/h | INTRAVENOUS | Status: DC
  Administered 2023-07-29 – 2023-07-31 (×5): 30 mg/h via INTRAVENOUS
  Filled 2023-07-28 (×5): qty 200

## 2023-07-28 MED ORDER — IPRATROPIUM-ALBUTEROL 0.5-2.5 (3) MG/3ML IN SOLN
3.0000 mL | Freq: Four times a day (QID) | RESPIRATORY_TRACT | Status: DC
  Administered 2023-07-28 (×2): 3 mL via RESPIRATORY_TRACT
  Filled 2023-07-28 (×2): qty 3

## 2023-07-28 MED ORDER — APIXABAN 5 MG PO TABS
5.0000 mg | ORAL_TABLET | Freq: Two times a day (BID) | ORAL | Status: DC
  Administered 2023-07-28 – 2023-07-31 (×6): 5 mg via ORAL
  Filled 2023-07-28 (×6): qty 1

## 2023-07-28 MED ORDER — IPRATROPIUM-ALBUTEROL 0.5-2.5 (3) MG/3ML IN SOLN
3.0000 mL | Freq: Once | RESPIRATORY_TRACT | Status: AC
  Administered 2023-07-28: 3 mL via RESPIRATORY_TRACT
  Filled 2023-07-28: qty 3

## 2023-07-28 MED ORDER — INSULIN ASPART 100 UNIT/ML IJ SOLN
0.0000 [IU] | Freq: Three times a day (TID) | INTRAMUSCULAR | Status: DC
  Administered 2023-07-28: 2 [IU] via SUBCUTANEOUS
  Filled 2023-07-28 (×2): qty 1

## 2023-07-28 MED ORDER — ALLOPURINOL 100 MG PO TABS
100.0000 mg | ORAL_TABLET | Freq: Every day | ORAL | Status: DC
  Administered 2023-07-29 – 2023-08-02 (×5): 100 mg via ORAL
  Filled 2023-07-28 (×5): qty 1

## 2023-07-28 MED ORDER — SODIUM CHLORIDE 0.9% FLUSH
3.0000 mL | Freq: Two times a day (BID) | INTRAVENOUS | Status: DC
  Administered 2023-07-28 – 2023-08-02 (×9): 3 mL via INTRAVENOUS

## 2023-07-28 MED ORDER — AMIODARONE HCL IN DEXTROSE 360-4.14 MG/200ML-% IV SOLN
60.0000 mg/h | INTRAVENOUS | Status: AC
  Administered 2023-07-28 (×2): 60 mg/h via INTRAVENOUS
  Filled 2023-07-28 (×2): qty 200

## 2023-07-28 MED ORDER — LEVOTHYROXINE SODIUM 25 MCG PO TABS
125.0000 ug | ORAL_TABLET | Freq: Every day | ORAL | Status: DC
  Administered 2023-07-29 – 2023-08-02 (×5): 125 ug via ORAL
  Filled 2023-07-28 (×5): qty 1

## 2023-07-28 MED ORDER — TECHNETIUM TO 99M ALBUMIN AGGREGATED
4.0000 | Freq: Once | INTRAVENOUS | Status: AC | PRN
Start: 2023-07-28 — End: 2023-07-28
  Administered 2023-07-28: 4.33 via INTRAVENOUS

## 2023-07-28 MED ORDER — ONDANSETRON HCL 4 MG/2ML IJ SOLN
4.0000 mg | Freq: Four times a day (QID) | INTRAMUSCULAR | Status: DC | PRN
  Administered 2023-07-30: 4 mg via INTRAVENOUS
  Filled 2023-07-28: qty 2

## 2023-07-28 MED ORDER — FERROUS SULFATE 325 (65 FE) MG PO TABS
325.0000 mg | ORAL_TABLET | Freq: Every day | ORAL | Status: DC
  Administered 2023-07-29 – 2023-08-02 (×5): 325 mg via ORAL
  Filled 2023-07-28 (×5): qty 1

## 2023-07-28 MED ORDER — METOPROLOL TARTRATE 25 MG PO TABS
12.5000 mg | ORAL_TABLET | Freq: Two times a day (BID) | ORAL | Status: DC
  Administered 2023-07-28: 12.5 mg via ORAL
  Filled 2023-07-28: qty 1

## 2023-07-28 NOTE — H&P (Addendum)
 History and Physical    Patient: Hector [name]:[id number] DOB: 27-Dec-1965 DOA: 07/28/2023 DOS: the patient was seen and examined on 07/28/2023 PCP: Dionicia Frater, MD  Patient coming from: Home  Chief Complaint:  Chief Complaint  Patient presents with   Fall   HPI: Hector [name] is a 58 y.o. male with medical history significant of HFrEF, atrial fibrillation on Eliquis , type 2 diabetes, OSA not compliant with CPAP, CKD stage IV, alcohol use disorder in remission, nonobstructive CAD, who presents to the ED due to ground-level fall.  [name] states that approximately 5 days ago, he began to come down with a cold, endorsing productive cough and shortness of breath.  He endorses poor appetite, headache that began at the same time.  Due to poor appetite, he did not take any of his medicines until 2 days ago.  He notes that he restarted on because he was having palpitations and felt as though he was putting weight on.  Then today, he felt weak all over.  He does not recall exactly what happened when he fell.  Due to this, EMS was called.  On EMS arrival, patient was noted to be in atrial fibrillation with RVR with low blood pressure.  ED course: On arrival to the ED, patient was normotensive at 112/96 with heart rate of 128.  He was saturating at 95% on room air.  He was afebrile and 98.4.  Initial workup notable for elevated BNP at 826, creatinine 3.55, BUN 44, GFR 19, and unremarkable CBC.  INR 1.4.  CT head and C-spine with only soft tissue laceration in the right frontal scalp.  Chest x-ray with congestive changes.  Patient started on IV Lasix  and DuoNeb.  TRH contacted for admission.   Review of Systems: As mentioned in the history of present illness. All other systems reviewed and are negative.  Past Medical History:  Diagnosis Date   Alcohol abuse    Alcoholic cardiomyopathy (HCC)    a. 12/2007 MV: EF 28%, no isch;  b. 8/12 Echo: EF 25-35%; c. 02/2014 Echo: EF  20-25%; d. 12/2014 Cath: minimal CAD; e. 01/2015 Echo: EF 50-55%;  d. 05/2016 Echo: EF 30-35%, diff HK, gr2 DD; e. 11/2016 Echo: EF 45-50%, diff HK; f. 09/2021 Echo: EF <20%; g. 11/2021 Echo: EF<20%.   Chronic combined systolic (congestive) and diastolic (congestive) heart failure (HCC)    a. 05/2016 Echo: EF 30-35%; b. 11/2016 Echo: EF 45-50%, diff HK; c. 09/2021 Echo: EF < 20%; d. 11/2021 Echo: EF <20%, no rwma, Nl RV fxn, sev dil LA, mod-sev MR. mod dil PA w/ mildly elev PASP.   CKD (chronic kidney disease), stage III (HCC)    Elevated troponin (chronic)    Essential hypertension    GI bleed 11/2013   Hyperlipidemia    Pancreatitis    Paroxysmal A-fib (HCC)    a. new onset s/p unsuccessful TEE/DCCV on 08/16/2014; b. CHA2DS2VASc = 3-> eliquis  (freq noncompliant); c. 02/2021 s/p TEE/DCCV; d. Prev on amio->d/c 2/2 abnl TFTs.   Paroxysmal atrial flutter (HCC)    Sleep-disordered breathing    Has yet to have a sleep study   Stroke [facility])    Past Surgical History:  Procedure Laterality Date   CARDIAC CATHETERIZATION N/A 01/09/2015   Procedure: Left Heart Cath and Coronary Angiography;  Surgeon: Arleen Lacer, MD;  Location: [facility] INVASIVE CV LAB;  Service: Cardiovascular;  Laterality: N/A;   CARDIOVERSION N/A 03/05/2021   Procedure: CARDIOVERSION;  Surgeon: Constancia Delton,  MD;  Location: [facility] ORS;  Service: Cardiovascular;  Laterality: N/A;   CARDIOVERSION N/A 01/19/2023   Procedure: CARDIOVERSION;  Surgeon: Darlis Eisenmenger, MD;  Location: [facility] ORS;  Service: Cardiovascular;  Laterality: N/A;   CENTRAL LINE INSERTION  04/28/2022   Procedure: CENTRAL LINE INSERTION;  Surgeon: Sammy Crisp, MD;  Location: [facility] INVASIVE CV LAB;  Service: Cardiovascular;;   COLONOSCOPY N/A 07/22/2020   Procedure: COLONOSCOPY;  Surgeon: Toledo, Alphonsus Jeans, MD;  Location: [facility] ENDOSCOPY;  Service: Gastroenterology;  Laterality: N/A;   ELECTROPHYSIOLOGIC STUDY N/A 08/16/2014   Procedure: CARDIOVERSION;  Surgeon:  Devorah Fonder, MD;  Location: [facility] ORS;  Service: Cardiovascular;  Laterality: N/A;   ESOPHAGOGASTRODUODENOSCOPY (EGD) WITH PROPOFOL  N/A 05/04/2022   Procedure: ESOPHAGOGASTRODUODENOSCOPY (EGD) WITH PROPOFOL ;  Surgeon: Selena Daily, MD;  Location: [facility] ENDOSCOPY;  Service: Gastroenterology;  Laterality: N/A;   FLEXIBLE SIGMOIDOSCOPY N/A 10/10/2015   Procedure: FLEXIBLE SIGMOIDOSCOPY;  Surgeon: Deveron Fly, MD;  Location: [facility] ENDOSCOPY;  Service: Endoscopy;  Laterality: N/A;   KNEE SURGERY Right    NM MYOVIEW  LTD  November 2011   No ischemia or infarction. EF 50-55% ( no improvement from 2009 Myoview  EF of 28%   RIGHT HEART CATH N/A 04/28/2022   Procedure: RIGHT HEART CATH;  Surgeon: Sammy Crisp, MD;  Location: [facility] INVASIVE CV LAB;  Service: Cardiovascular;  Laterality: N/A;   RIGHT HEART CATH N/A 06/23/2023   Procedure: RIGHT HEART CATH;  Surgeon: Darlis Eisenmenger, MD;  Location: [facility] INVASIVE CV LAB;  Service: Cardiovascular;  Laterality: N/A;   TEE WITHOUT CARDIOVERSION N/A 08/16/2014   Procedure: TRANSESOPHAGEAL ECHOCARDIOGRAM (TEE);  Surgeon: Devorah Fonder, MD;  Location: [facility] ORS;  Service: Cardiovascular;  Laterality: N/A;   TEE WITHOUT CARDIOVERSION N/A 03/05/2021   Procedure: TRANSESOPHAGEAL ECHOCARDIOGRAM (TEE);  Surgeon: Constancia Delton, MD;  Location: [facility] ORS;  Service: Cardiovascular;  Laterality: N/A;   TEE WITHOUT CARDIOVERSION N/A 05/01/2022   Procedure: TRANSESOPHAGEAL ECHOCARDIOGRAM;  Surgeon: Darlis Eisenmenger, MD;  Location: [facility] ORS;  Service: Cardiovascular;  Laterality: N/A;  TEE with cardioversion   TEE WITHOUT CARDIOVERSION N/A 01/19/2023   Procedure: TRANSESOPHAGEAL ECHOCARDIOGRAM (TEE);  Surgeon: Darlis Eisenmenger, MD;  Location: [facility] ORS;  Service: Cardiovascular;  Laterality: N/A;   Social History:  reports that he quit smoking about 25 years ago. His smoking use included cigarettes. He started smoking about 37 years ago. He has a 12 pack-year  smoking history. He has never used smokeless tobacco. He reports that he does not currently use alcohol. He reports that he does not use drugs.  Allergies  Allergen Reactions   Esomeprazole Magnesium  Other (See Comments)    Suspected interstitial nephritis 2018   Egg-Derived Products Rash    Family History  Problem Relation Age of Onset   Hypertension Mother    Hyperlipidemia Mother    Diabetes Mother     Prior to Admission medications   Medication Sig Start Date End Date Taking? Authorizing Provider  allopurinol  (ZYLOPRIM ) 100 MG tablet Take 100 mg by mouth daily. 05/03/23  Yes [provider]  amiodarone  (PACERONE ) 200 MG tablet Take 1 tablet (200 mg total) by mouth daily. Patient not taking: Reported on 07/28/2023 01/28/23   Charlette Console, FNP  apixaban  (ELIQUIS ) 5 MG TABS tablet Take 1 tablet (5 mg total) by mouth 2 (two) times daily. 07/01/23 07/31/23 Yes Agbata, Tochukwu, MD  atorvastatin  (LIPITOR ) 80 MG tablet Take 1 tablet (80 mg total) by mouth daily. 11/24/21  Yes Krishnan, Sendil K,  MD  calcitRIOL (ROCALTROL) 0.25 MCG capsule Take 0.25 mcg by mouth 3 (three) times a week. Patient not taking: Reported on 07/28/2023    [provider]  empagliflozin  (JARDIANCE ) 10 MG TABS tablet Take 1 tablet (10 mg total) by mouth daily. Patient not taking: Reported on 07/28/2023 05/26/22   Charlette Console, FNP  ferrous sulfate  325 (65 FE) MG EC tablet Take 325 mg by mouth daily with breakfast. Patient not taking: Reported on 07/28/2023    [provider]  gabapentin  (NEURONTIN ) 100 MG capsule Take 1 capsule (100 mg total) by mouth 2 (two) times daily. 01/19/23  Yes Wieting, Richard, MD  glipiZIDE (GLUCOTROL XL) 5 MG 24 hr tablet Take 5 mg by mouth daily. 05/03/23  Yes [provider]  isosorbide -hydrALAZINE  (BIDIL ) 20-37.5 MG tablet Take 1 tablet by mouth 3 (three) times daily. 07/01/23 07/31/23 Yes Agbata, Tochukwu, MD  levothyroxine  (SYNTHROID ) 125 MCG tablet Take 125 mcg  by mouth daily before breakfast.   Yes [provider]  lipase/protease/amylase (CREON ) 36000 UNITS CPEP capsule Take 1 capsule (36,000 Units total) by mouth 3 (three) times daily before meals. Patient not taking: Reported on 07/28/2023 01/19/23   Verla Glaze, MD  polyethylene glycol (MIRALAX  / GLYCOLAX ) 17 g packet Take 17 g by mouth daily as needed for mild constipation. Patient not taking: Reported on 07/28/2023 01/19/23   Verla Glaze, MD  torsemide  (DEMADEX ) 20 MG tablet Take 2 tablets (40 mg total) by mouth daily. 07/01/23 07/31/23 Yes Read Camel, MD    Physical Exam: Vitals:   07/28/23 1130 07/28/23 1200 07/28/23 1230 07/28/23 1321  BP: 110/89 (!) 113/90 (!) 113/102   Pulse: (!) 102 72 (!) 143   Resp: (!) 34 14 (!) 25   Temp:    98.3 F (36.8 C)  TempSrc:    Oral  SpO2: 96% 96% 97%   Weight:      Height:       Physical Exam Vitals and nursing note reviewed.  Constitutional:      General: He is not in acute distress.    Appearance: He is obese.  HENT:     Head: Normocephalic and atraumatic.  Cardiovascular:     Rate and Rhythm: Tachycardia present. Rhythm irregular.     Heart sounds: No murmur heard.    No gallop.  Pulmonary:     Effort: Pulmonary effort is normal. Tachypnea present. No respiratory distress.     Breath sounds: Decreased breath sounds (bases) present.  Abdominal:     General: Bowel sounds are normal. There is no distension.     Palpations: Abdomen is soft.     Tenderness: There is no abdominal tenderness. There is no guarding.  Musculoskeletal:     Right lower leg: No edema.     Left lower leg: No edema.  Skin:    General: Skin is warm and dry.  Neurological:     Mental Status: He is alert and oriented to person, place, and time. Mental status is at baseline.  Psychiatric:        Mood and Affect: Mood normal.        Behavior: Behavior normal.    Data Reviewed: CBC with WBC of 8.5, hemoglobin of 12.9, platelets of 221 BMP with  sodium of 136, potassium 4.0, bicarb 22, glucose 127, BUN 44, creatinine 3.55, GFR 19 BNP 826 Troponin 74 INR 1.4  EKG personally reviewed.  Wide-complex tachycardia with subtle irregularity concerning for atrial fibrillation with RVR with known  LBBB  CT Head Wo Contrast Result Date: 07/28/2023 CLINICAL DATA:  Fall, head injury EXAM: CT HEAD WITHOUT CONTRAST CT CERVICAL SPINE WITHOUT CONTRAST TECHNIQUE: Multidetector CT imaging of the head and cervical spine was performed following the standard protocol without intravenous contrast. Multiplanar CT image reconstructions of the cervical spine were also generated. RADIATION DOSE REDUCTION: This exam was performed according to the departmental dose-optimization program which includes automated exposure control, adjustment of the mA and/or kV according to patient size and/or use of iterative reconstruction technique. COMPARISON:  None Available. FINDINGS: CT HEAD FINDINGS Brain: No evidence of acute infarction, hemorrhage, hydrocephalus, extra-axial collection or mass lesion/mass effect. Periventricular and deep white matter hypodensity. Vascular: No hyperdense vessel or unexpected calcification. Skull: Normal. Negative for fracture or focal lesion. Sinuses/Orbits: No acute finding. Other: Soft tissue laceration of the right frontal scalp (series 2, image 21). CT CERVICAL SPINE FINDINGS Alignment: Positional straightening and reversal of the normal cervical lordosis. Skull base and vertebrae: No acute fracture. No primary bone lesion or focal pathologic process. Soft tissues and spinal canal: No prevertebral fluid or swelling. No visible canal hematoma. Disc levels:  Intact. Upper chest: Negative. Other: None. IMPRESSION: 1. No acute intracranial pathology. 2. Extensive periventricular and deep white matter hypodensity, which may reflect age advanced small-vessel white matter disease or demyelinating disorder. 3. Soft tissue laceration of the right frontal scalp.  4. No fracture or static subluxation of the cervical spine. Electronically Signed   By: Fredricka Jenny M.D.   On: 07/28/2023 11:19   CT Cervical Spine Wo Contrast Result Date: 07/28/2023 CLINICAL DATA:  Fall, head injury EXAM: CT HEAD WITHOUT CONTRAST CT CERVICAL SPINE WITHOUT CONTRAST TECHNIQUE: Multidetector CT imaging of the head and cervical spine was performed following the standard protocol without intravenous contrast. Multiplanar CT image reconstructions of the cervical spine were also generated. RADIATION DOSE REDUCTION: This exam was performed according to the departmental dose-optimization program which includes automated exposure control, adjustment of the mA and/or kV according to patient size and/or use of iterative reconstruction technique. COMPARISON:  None Available. FINDINGS: CT HEAD FINDINGS Brain: No evidence of acute infarction, hemorrhage, hydrocephalus, extra-axial collection or mass lesion/mass effect. Periventricular and deep white matter hypodensity. Vascular: No hyperdense vessel or unexpected calcification. Skull: Normal. Negative for fracture or focal lesion. Sinuses/Orbits: No acute finding. Other: Soft tissue laceration of the right frontal scalp (series 2, image 21). CT CERVICAL SPINE FINDINGS Alignment: Positional straightening and reversal of the normal cervical lordosis. Skull base and vertebrae: No acute fracture. No primary bone lesion or focal pathologic process. Soft tissues and spinal canal: No prevertebral fluid or swelling. No visible canal hematoma. Disc levels:  Intact. Upper chest: Negative. Other: None. IMPRESSION: 1. No acute intracranial pathology. 2. Extensive periventricular and deep white matter hypodensity, which may reflect age advanced small-vessel white matter disease or demyelinating disorder. 3. Soft tissue laceration of the right frontal scalp. 4. No fracture or static subluxation of the cervical spine. Electronically Signed   By: Fredricka Jenny M.D.   On:  07/28/2023 11:19   DG Chest Port 1 View Result Date: 07/28/2023 CLINICAL DATA:  Shortness of breath EXAM: PORTABLE CHEST 1 VIEW COMPARISON:  June 21, 2023 FINDINGS: Bilateral reticular interstitial infiltrates correlate with congestive changes without consolidations. No pleural effusions Heart and mediastinum in the upper limits of normal with a left ventricular configuration IMPRESSION: Bilateral reticular interstitial infiltrates correlate with congestive changes without consolidations. Electronically Signed   By: Fredrich Jefferson M.D.   On:  07/28/2023 10:05   Results are pending, will review when available.  Assessment and Plan:  * Syncope Patient is presenting with a syncopal episode with loss of consciousness leading to a right frontal scalp laceration.  Unclear etiology, however likely multifactorial in the setting of medication compliance after upper respiratory infection, rapid ventricular rate, and acute on chronic HFrEF.  He was recently diagnosed with DVT however, so will obtain VQ scan  - Telemetry monitoring - VQ scan   Acute on chronic HFrEF (heart failure with reduced ejection fraction) (HCC) Long history of HFrEF with EF < 20% in the setting of non-ischemic cardiomyopathy. He does not appear profoundly hypervolemic, however BNP is elevated and there is concern for pulmonary edema on CXR. He notes he did not take Torsemide  for several days.   -Given high risk history, will discuss with heart failure team; appreciate their recommendations - Lasix  80 mg IV once - Reassess volume status tomorrow - Daily weights - Strict and out - Continue home GDMT as blood pressure tolerates  Atrial fibrillation with RVR (HCC) Patient has a history of atrial fibrillation with frequent RVR since arrival to the ED.  He was previously on metoprolol , however it was discontinued due to bradycardia and not restarted due to low output heart failure.  Changed he has been noncompliant with his amiodarone .   Given concern that his RVR may be contributing to his weakness, we will try a very small dose of metoprolol , and discussed with heart failure team.  - Telemetry monitoring - Continue home Eliquis  - Metoprolol  12.5 mg oral once  COPD with acute exacerbation (HCC) Patient reports increased cough and shortness of breath, in addition to poor p.o. intake, body aches and headache that began 5 days ago.  Likely viral etiology.  No significant wheezing on examination, however meets criteria for COPD exacerbation.  - Continuous pulse oximetry - Start prednisone  40 mg  - RVP and procalcitonin pending - DuoNebs every 6 hours  Type 2 diabetes mellitus (HCC) - SSI, moderate  Hypothyroidism - Check TSH given known medication noncompliance - Continue home Synthroid  for now  Chronic renal impairment, stage 4 (severe) (HCC) History of CKD Stage IV with creatinine ranging from 3.1 up to 4, currently 3.55.  Alcohol use, unspecified, in remission - Daily folic acid , thiamine  and multivitamin  OSA (obstructive sleep apnea) Patient reports noncompliance with CPAP.  Advance Care Planning:   Code Status: Full Code   Consults: Heart failure  Family Communication: No family at bedside  Severity of Illness: The appropriate patient status for this patient is INPATIENT. Inpatient status is judged to be reasonable and necessary in order to provide the required intensity of service to ensure the patient's safety. The patient's presenting symptoms, physical exam findings, and initial radiographic and laboratory data in the context of their chronic comorbidities is felt to place them at high risk for further clinical deterioration. Furthermore, it is not anticipated that the patient will be medically stable for discharge from the hospital within 2 midnights of admission.   * I certify that at the point of admission it is my clinical judgment that the patient will require inpatient hospital care spanning beyond  2 midnights from the point of admission due to high intensity of service, high risk for further deterioration and high frequency of surveillance required.*  Author: Avi Body, MD 07/28/2023 3:10 PM  For on call review [email or web address].

## 2023-07-28 NOTE — Assessment & Plan Note (Signed)
 Long history of HFrEF with EF < 20% in the setting of non-ischemic cardiomyopathy. He does not appear profoundly hypervolemic, however BNP is elevated and there is concern for pulmonary edema on CXR. He notes he did not take Torsemide  for several days.   -Given high risk history, will discuss with heart failure team; appreciate their recommendations - Lasix  80 mg IV once - Reassess volume status tomorrow - Daily weights - Strict and out - Continue home GDMT as blood pressure tolerates

## 2023-07-28 NOTE — Assessment & Plan Note (Signed)
-  SSI, moderate

## 2023-07-28 NOTE — Assessment & Plan Note (Signed)
 Patient has a history of atrial fibrillation with frequent RVR since arrival to the ED.  He was previously on metoprolol , however it was discontinued due to bradycardia and not restarted due to low output heart failure.  Changed he has been noncompliant with his amiodarone .  Given concern that his RVR may be contributing to his weakness, we will try a very small dose of metoprolol , and discussed with heart failure team.  - Telemetry monitoring - Continue home Eliquis  - Metoprolol  12.5 mg oral once

## 2023-07-28 NOTE — Assessment & Plan Note (Signed)
-   Check TSH given known medication noncompliance - Continue home Synthroid  for now

## 2023-07-28 NOTE — Assessment & Plan Note (Signed)
 Patient is presenting with a syncopal episode with loss of consciousness leading to a right frontal scalp laceration.  Unclear etiology, however likely multifactorial in the setting of medication compliance after upper respiratory infection, rapid ventricular rate, and acute on chronic HFrEF.  He was recently diagnosed with DVT however, so will obtain VQ scan  - Telemetry monitoring - VQ scan

## 2023-07-28 NOTE — Assessment & Plan Note (Signed)
-   Daily folic acid , thiamine  and multivitamin

## 2023-07-28 NOTE — Assessment & Plan Note (Signed)
 Patient reports noncompliance with CPAP.

## 2023-07-28 NOTE — ED Notes (Addendum)
 Fall Risk precautions placed on pt. Side rails up x2, grip socks, yellow wrist band, bed alarm, call bell within reach and bed locked and lowered.

## 2023-07-28 NOTE — Assessment & Plan Note (Signed)
 Patient reports increased cough and shortness of breath, in addition to poor p.o. intake, body aches and headache that began 5 days ago.  Likely viral etiology.  No significant wheezing on examination, however meets criteria for COPD exacerbation.  - Continuous pulse oximetry - Start prednisone  40 mg  - RVP and procalcitonin pending - DuoNebs every 6 hours

## 2023-07-28 NOTE — Assessment & Plan Note (Signed)
 History of CKD Stage IV with creatinine ranging from 3.1 up to 4, currently 3.55.

## 2023-07-28 NOTE — ED Triage Notes (Signed)
 Pt arrived via EMS due to a fall. Pt was found to be in Afib RVR by EMS. Pt did have a +LOC with a head contusion. 500ml of LR was given to pt due to hypotension. Pt has a 18g in the RFA.

## 2023-07-28 NOTE — ED Provider Notes (Signed)
 [facility] Provider Note   Event Date/Time   First MD Initiated Contact with Patient 07/28/23 [phone]     (approximate) History  Fall  HPI Hector [name] is a 58 y.o. male with a stated past medical history of CHF, type 2 diabetes, paroxysmal atrial fibrillation, hypothyroidism, and intermittent alcohol abuse who presents via EMS after a syncopal event in which patient states he was sitting down when he lost consciousness suddenly without any preceding symptoms and woke up on the ground with his brother standing over him.  Patient states that he was feeling short of breath before this event occurred.  EMS noted hypotension on their arrival and patient received 500 of LR prior to arrival as well as placed on 2 L nasal cannula.  Patient continues to complain of shortness of breath.  Patient also has contusion to the forehead and right eyebrow with superficial abrasion ROS: Patient currently denies any vision changes, tinnitus, difficulty speaking, facial droop, sore throat, chest pain, abdominal pain, nausea/vomiting/diarrhea, dysuria, or weakness/numbness/paresthesias in any extremity   Physical Exam  Triage Vital Signs: ED Triage Vitals [07/28/23 0936]  Encounter Vitals Group     BP      Systolic BP Percentile      Diastolic BP Percentile      Pulse      Resp      Temp      Temp src      SpO2      Weight 300 lb (136.1 kg)     Height 5\' 11"  (1.803 m)     Head Circumference      Peak Flow      Pain Score 8     Pain Loc      Pain Education      Exclude from Growth Chart    Most recent vital signs: Vitals:   07/28/23 1230 07/28/23 1321  BP: (!) 113/102   Pulse: (!) 143   Resp: (!) 25   Temp:  98.3 F (36.8 C)  SpO2: 97%    General: Awake, oriented x4. CV:  Good peripheral perfusion. Resp:  Normal effort. Abd:  No distention. Other:  Middle-aged morbidly obese African-American male resting comfortably in no acute distress.  Abrasion with  associated underlying edema and surrounding ecchymosis to the right upper forehead and right eyebrow ED Results / Procedures / Treatments  Labs (all labs ordered are listed, but only abnormal results are displayed) Labs Reviewed  BASIC METABOLIC PANEL WITH GFR - Abnormal; Notable for the following components:      Result Value   Glucose, Bld 127 (*)    BUN 44 (*)    Creatinine, Ser 3.55 (*)    Calcium  8.2 (*)    GFR, Estimated 19 (*)    All other components within normal limits  CBC - Abnormal; Notable for the following components:   Hemoglobin 12.9 (*)    RDW 17.6 (*)    All other components within normal limits  PROTIME-INR - Abnormal; Notable for the following components:   Prothrombin Time 17.6 (*)    INR 1.4 (*)    All other components within normal limits  BRAIN NATRIURETIC PEPTIDE - Abnormal; Notable for the following components:   B Natriuretic Peptide 826.8 (*)    All other components within normal limits  TROPONIN I (HIGH SENSITIVITY) - Abnormal; Notable for the following components:   Troponin I (High Sensitivity) 74 (*)    All other components within normal limits  RESPIRATORY PANEL BY PCR  PROCALCITONIN  TSH  TROPONIN I (HIGH SENSITIVITY)   EKG ED ECG REPORT I, Charleen Conn, the attending physician, personally viewed and interpreted this ECG. Date: 07/28/2023 EKG Time: 0936 Rate: 130 Rhythm: Sinus sinus rhythm QRS Axis: normal Intervals: LBBB ST/T Wave abnormalities: normal Narrative Interpretation: Normal sinus rhythm with left bundle branch block.  No evidence of acute ischemia RADIOLOGY ED MD interpretation: CT of the head without contrast interpreted by me shows no evidence of acute abnormalities including no intracerebral hemorrhage, obvious masses, or significant edema CT of the cervical spine interpreted by me does not show any evidence of acute abnormalities including no acute fracture, malalignment, height loss, or dislocation Single view portable  x-ray of the chest shows bilateral pulmonary edema - All radiology independently interpreted and agree with radiology assessment Official radiology report(s): CT Head Wo Contrast Result Date: 07/28/2023 CLINICAL DATA:  Fall, head injury EXAM: CT HEAD WITHOUT CONTRAST CT CERVICAL SPINE WITHOUT CONTRAST TECHNIQUE: Multidetector CT imaging of the head and cervical spine was performed following the standard protocol without intravenous contrast. Multiplanar CT image reconstructions of the cervical spine were also generated. RADIATION DOSE REDUCTION: This exam was performed according to the departmental dose-optimization program which includes automated exposure control, adjustment of the mA and/or kV according to patient size and/or use of iterative reconstruction technique. COMPARISON:  None Available. FINDINGS: CT HEAD FINDINGS Brain: No evidence of acute infarction, hemorrhage, hydrocephalus, extra-axial collection or mass lesion/mass effect. Periventricular and deep white matter hypodensity. Vascular: No hyperdense vessel or unexpected calcification. Skull: Normal. Negative for fracture or focal lesion. Sinuses/Orbits: No acute finding. Other: Soft tissue laceration of the right frontal scalp (series 2, image 21). CT CERVICAL SPINE FINDINGS Alignment: Positional straightening and reversal of the normal cervical lordosis. Skull base and vertebrae: No acute fracture. No primary bone lesion or focal pathologic process. Soft tissues and spinal canal: No prevertebral fluid or swelling. No visible canal hematoma. Disc levels:  Intact. Upper chest: Negative. Other: None. IMPRESSION: 1. No acute intracranial pathology. 2. Extensive periventricular and deep white matter hypodensity, which may reflect age advanced small-vessel white matter disease or demyelinating disorder. 3. Soft tissue laceration of the right frontal scalp. 4. No fracture or static subluxation of the cervical spine. Electronically Signed   By: Fredricka Jenny M.D.   On: 07/28/2023 11:19   CT Cervical Spine Wo Contrast Result Date: 07/28/2023 CLINICAL DATA:  Fall, head injury EXAM: CT HEAD WITHOUT CONTRAST CT CERVICAL SPINE WITHOUT CONTRAST TECHNIQUE: Multidetector CT imaging of the head and cervical spine was performed following the standard protocol without intravenous contrast. Multiplanar CT image reconstructions of the cervical spine were also generated. RADIATION DOSE REDUCTION: This exam was performed according to the departmental dose-optimization program which includes automated exposure control, adjustment of the mA and/or kV according to patient size and/or use of iterative reconstruction technique. COMPARISON:  None Available. FINDINGS: CT HEAD FINDINGS Brain: No evidence of acute infarction, hemorrhage, hydrocephalus, extra-axial collection or mass lesion/mass effect. Periventricular and deep white matter hypodensity. Vascular: No hyperdense vessel or unexpected calcification. Skull: Normal. Negative for fracture or focal lesion. Sinuses/Orbits: No acute finding. Other: Soft tissue laceration of the right frontal scalp (series 2, image 21). CT CERVICAL SPINE FINDINGS Alignment: Positional straightening and reversal of the normal cervical lordosis. Skull base and vertebrae: No acute fracture. No primary bone lesion or focal pathologic process. Soft tissues and spinal canal: No prevertebral fluid or swelling. No visible canal hematoma. Disc levels:  Intact. Upper chest: Negative. Other: None. IMPRESSION: 1. No acute intracranial pathology. 2. Extensive periventricular and deep white matter hypodensity, which may reflect age advanced small-vessel white matter disease or demyelinating disorder. 3. Soft tissue laceration of the right frontal scalp. 4. No fracture or static subluxation of the cervical spine. Electronically Signed   By: Fredricka Jenny M.D.   On: 07/28/2023 11:19   DG Chest Port 1 View Result Date: 07/28/2023 CLINICAL DATA:  Shortness of  breath EXAM: PORTABLE CHEST 1 VIEW COMPARISON:  June 21, 2023 FINDINGS: Bilateral reticular interstitial infiltrates correlate with congestive changes without consolidations. No pleural effusions Heart and mediastinum in the upper limits of normal with a left ventricular configuration IMPRESSION: Bilateral reticular interstitial infiltrates correlate with congestive changes without consolidations. Electronically Signed   By: Fredrich Jefferson M.D.   On: 07/28/2023 10:05   PROCEDURES: Critical Care performed: Yes, see critical care procedure note(s) .1-3 Lead EKG Interpretation  Performed by: Charleen Conn, MD Authorized by: Charleen Conn, MD     Interpretation: abnormal     ECG rate:  121   ECG rate assessment: tachycardic     Rhythm: sinus tachycardia     Ectopy: none     Conduction: normal   Comments:     LBBB CRITICAL CARE Performed by: Aiyden Lauderback K Josseline Reddin  Total critical care time: 33 minutes  Critical care time was exclusive of separately billable procedures and treating other patients.  Critical care was necessary to treat or prevent imminent or life-threatening deterioration.  Critical care was time spent personally by me on the following activities: development of treatment plan with patient and/or surrogate as well as nursing, discussions with consultants, evaluation of patient's response to treatment, examination of patient, obtaining history from patient or surrogate, ordering and performing treatments and interventions, ordering and review of laboratory studies, ordering and review of radiographic studies, pulse oximetry and re-evaluation of patient's condition.  MEDICATIONS ORDERED IN ED: Medications  acetaminophen  (TYLENOL ) tablet 500 mg (500 mg Oral Patient Refused/Not Given 07/28/23 0951)  sodium chloride  flush (NS) 0.9 % injection 3 mL (3 mLs Intravenous Not Given 07/28/23 1410)  sodium chloride  flush (NS) 0.9 % injection 3 mL (has no administration in time range)  0.9 %   sodium chloride  infusion (has no administration in time range)  acetaminophen  (TYLENOL ) tablet 650 mg (has no administration in time range)  ondansetron  (ZOFRAN ) injection 4 mg (has no administration in time range)  ipratropium-albuterol  (DUONEB) 0.5-2.5 (3) MG/3ML nebulizer solution 3 mL (3 mLs Nebulization Given 07/28/23 1413)  predniSONE  (DELTASONE ) tablet 40 mg (40 mg Oral Given 07/28/23 1414)  insulin  aspart (novoLOG ) injection 0-15 Units (has no administration in time range)  amiodarone  (NEXTERONE ) 1.8 mg/mL load via infusion 150 mg (has no administration in time range)  amiodarone  (NEXTERONE  PREMIX) 360-4.14 MG/200ML-% (1.8 mg/mL) IV infusion (has no administration in time range)    Followed by  amiodarone  (NEXTERONE  PREMIX) 360-4.14 MG/200ML-% (1.8 mg/mL) IV infusion (has no administration in time range)  ipratropium-albuterol  (DUONEB) 0.5-2.5 (3) MG/3ML nebulizer solution 3 mL (3 mLs Nebulization Given 07/28/23 0951)  furosemide  (LASIX ) injection 40 mg (40 mg Intravenous Given 07/28/23 1243)  furosemide  (LASIX ) injection 40 mg (40 mg Intravenous Given 07/28/23 1414)   IMPRESSION / MDM / ASSESSMENT AND PLAN / ED COURSE  I reviewed the triage vital signs and the nursing notes.  The patient is on the cardiac monitor to evaluate for evidence of arrhythmia and/or significant heart rate changes. Patient's presentation is most consistent with acute presentation with potential threat to life or bodily function. Endorses dyspnea, endorses LE edema, endorses loss of consciousness Denies Non adherence to medication regimen  Workup: ECG, CBC, BMP, Troponin, BNP, CXR Findings: EKG: No STEMI and no evidence of Brugada's sign, delta wave, epsilon wave, significantly prolonged QTc, or malignant arrhythmia. BNP: 826 CXR: Bilateral pulmonary edema Based on history, exam and findings, presentation most consistent with acute on chronic heart failure. Low suspicion for PNA, ACS,  tamponade, aortic dissection. Interventions: Oxygen, Diuresis  Reassessment: Symptoms improved in ED with oxygen and diuresis Disposition (Stable but not significantly improved): Admit to medicine for further monitoring and for improvement of medication regimen to control symptoms.    FINAL CLINICAL IMPRESSION(S) / ED DIAGNOSES   Final diagnoses:  Syncope, unspecified syncope type  Acute respiratory failure with hypoxia (HCC)  Acute pulmonary edema (HCC)   Rx / DC Orders   ED Discharge Orders     None      Note:  This document was prepared using Dragon voice recognition software and may include unintentional dictation errors.   Delainee Tramel K, MD 07/28/23 [phone]

## 2023-07-28 NOTE — Consult Note (Signed)
 Advanced Heart Failure Team Consult Note   Primary Physician: Dionicia Frater, MD Cardiologist:  Belva Boyden, MD HF Cardiologist: Shaida Route  Reason for Consultation: Syncope, AF  HPI:    Hector [name] is seen today for evaluation of syncope/AF at the request of Dr. Guss Legacy.   [name] is a 58 yo male with HFreF, ETOH use, DM2, morbid obesity, PAF (h/o GIB on apixaban ),  OSA on CPAP, CKD stage IV (Scr ~3.3), medication noncompliance.    HF dates back to at least 2009, thought to presumably be nonischemic in the setting of alcohol use, with an EF of 20%.     Echo 2016 EF 20-25%. LHC 12/2014 minimal CAD   EF initially improved with restoration of NSR but then dropped back down    He underwent TEE/DCCV in 02/2021 at that time showed an EF of less than 20% with severely reduced RV systolic function.  He was reevaluated by EP in 04/2021 and felt not to be a good candidate for ICD or ablation given medication noncompliance.   Echo 10/23 EF <  20% with moderate RV dysfunction and moderate to sever MR.  He was discharged from the hospital on 12/12/2021 with a weight of 247 pounds.      In 3/24 underwent TEE-guided DCCV as well as RHC. TEE EF < 20%, mod RV HK, mod MR. RHC with markedly elevated filling pressures and low CO  RA 17 PA 58/39 (43) PCWP 33 Fick 3.4/1.5 (PA sat 49%) PAPi 1.2    Admitted 01/16/23 due to abdominal pain and found to be tachycardic. CT scan of the abdomen without contrast did not find any findings in the abdomen or pelvis to explain the patient's abdominal pain, cholelithiasis, renal cysts. Worsening renal function so diuretic held and IVF given. Cardiology and nephrology consults done. Was in atrial flutter with successful cardioversion. Given amiodarone .    Echo 01/19/23: EF <20% with mild MR, no thrombus   Has had frequent hospital admits in 2023 and 2024.   Presents to ED today with syncopal episode. Says he had a cold over the weekend  so stopped taking his meds for a few days. Yesterday was sitting on the side of the bed and had abrupt syncope and fell back on the bed. Recovered quickly. Today was sitting on the porch when he had recurrent abrupt syncope. Denies preceding CP. Says he doesn't remember much of it. Brother called EMS. On EMS arrival patient awake but weak. BP low. Was in AF with RVR. Has scalp laceration  Denies recent edema, orthopnea or PND>   On arrival to the ED, patient was normotensive at 112/96 with heart rate of 128. He was saturating at 95% on room air. He was afebrile and 98.4. Initial workup notable for elevated BNP at 826 (baseline ~300), creatinine 3.55, BUN 44, GFR 19, and unremarkable CBC. INR 1.4. CT head and C-spine with only soft tissue laceration in the right frontal scalp. Chest x-ray with congestive changes.   In seeing him in ER. Appears comfortable. Lying flat in bed. Breathing ok. HRs in 80s (AFL). Warm. No edema.   Home Medications Prior to Admission medications   Medication Sig Start Date End Date Taking? Authorizing Provider  allopurinol  (ZYLOPRIM ) 100 MG tablet Take 100 mg by mouth daily. 05/03/23  Yes [provider]  apixaban  (ELIQUIS ) 5 MG TABS tablet Take 1 tablet (5 mg total) by mouth 2 (two) times daily. 07/01/23 07/31/23 Yes Agbata, Gregary Lean, MD  atorvastatin  (  LIPITOR ) 80 MG tablet Take 1 tablet (80 mg total) by mouth daily. 11/24/21  Yes Krishnan, Sendil K, MD  gabapentin  (NEURONTIN ) 100 MG capsule Take 1 capsule (100 mg total) by mouth 2 (two) times daily. 01/19/23  Yes Wieting, Richard, MD  glipiZIDE (GLUCOTROL XL) 5 MG 24 hr tablet Take 5 mg by mouth daily. 05/03/23  Yes [provider]  isosorbide -hydrALAZINE  (BIDIL ) 20-37.5 MG tablet Take 1 tablet by mouth 3 (three) times daily. 07/01/23 07/31/23 Yes Agbata, Tochukwu, MD  levothyroxine  (SYNTHROID ) 125 MCG tablet Take 125 mcg by mouth daily before breakfast.   Yes [provider]  torsemide  (DEMADEX ) 20 MG  tablet Take 2 tablets (40 mg total) by mouth daily. 07/01/23 07/31/23 Yes Agbata, Tochukwu, MD  amiodarone  (PACERONE ) 200 MG tablet Take 1 tablet (200 mg total) by mouth daily. Patient not taking: Reported on 07/28/2023 01/28/23   Shawnee Dellen A, FNP  calcitRIOL (ROCALTROL) 0.25 MCG capsule Take 0.25 mcg by mouth 3 (three) times a week. Patient not taking: Reported on 07/28/2023    [provider]  empagliflozin  (JARDIANCE ) 10 MG TABS tablet Take 1 tablet (10 mg total) by mouth daily. Patient not taking: Reported on 07/28/2023 05/26/22   Charlette Console, FNP  ferrous sulfate  325 (65 FE) MG EC tablet Take 325 mg by mouth daily with breakfast. Patient not taking: Reported on 07/28/2023    [provider]  lipase/protease/amylase (CREON ) 36000 UNITS CPEP capsule Take 1 capsule (36,000 Units total) by mouth 3 (three) times daily before meals. Patient not taking: Reported on 07/28/2023 01/19/23   Verla Glaze, MD  polyethylene glycol (MIRALAX  / GLYCOLAX ) 17 g packet Take 17 g by mouth daily as needed for mild constipation. Patient not taking: Reported on 07/28/2023 01/19/23   Verla Glaze, MD    Past Medical History: Past Medical History:  Diagnosis Date   Alcohol abuse    Alcoholic cardiomyopathy (HCC)    a. 12/2007 MV: EF 28%, no isch;  b. 8/12 Echo: EF 25-35%; c. 02/2014 Echo: EF 20-25%; d. 12/2014 Cath: minimal CAD; e. 01/2015 Echo: EF 50-55%;  d. 05/2016 Echo: EF 30-35%, diff HK, gr2 DD; e. 11/2016 Echo: EF 45-50%, diff HK; f. 09/2021 Echo: EF <20%; g. 11/2021 Echo: EF<20%.   Chronic combined systolic (congestive) and diastolic (congestive) heart failure (HCC)    a. 05/2016 Echo: EF 30-35%; b. 11/2016 Echo: EF 45-50%, diff HK; c. 09/2021 Echo: EF < 20%; d. 11/2021 Echo: EF <20%, no rwma, Nl RV fxn, sev dil LA, mod-sev MR. mod dil PA w/ mildly elev PASP.   CKD (chronic kidney disease), stage III (HCC)    Elevated troponin (chronic)    Essential hypertension    GI bleed 11/2013    Hyperlipidemia    Pancreatitis    Paroxysmal A-fib (HCC)    a. new onset s/p unsuccessful TEE/DCCV on 08/16/2014; b. CHA2DS2VASc = 3-> eliquis  (freq noncompliant); c. 02/2021 s/p TEE/DCCV; d. Prev on amio->d/c 2/2 abnl TFTs.   Paroxysmal atrial flutter (HCC)    Sleep-disordered breathing    Has yet to have a sleep study   Stroke [facility])     Past Surgical History: Past Surgical History:  Procedure Laterality Date   CARDIAC CATHETERIZATION N/A 01/09/2015   Procedure: Left Heart Cath and Coronary Angiography;  Surgeon: Arleen Lacer, MD;  [facility] INVASIVE CV LAB;  Service: Cardiovascular;  Laterality: N/A;   CARDIOVERSION N/A 03/05/2021   Procedure: CARDIOVERSION;  Surgeon: Constancia Delton, MD;  Location: [facility] ORS;  Service: Cardiovascular;  Laterality: N/A;   CARDIOVERSION N/A 01/19/2023   Procedure: CARDIOVERSION;  Surgeon: Darlis Eisenmenger, MD;  Location: [facility] ORS;  Service: Cardiovascular;  Laterality: N/A;   CENTRAL LINE INSERTION  04/28/2022   Procedure: CENTRAL LINE INSERTION;  Surgeon: Sammy Crisp, MD;  Location: [facility] INVASIVE CV LAB;  Service: Cardiovascular;;   COLONOSCOPY N/A 07/22/2020   Procedure: COLONOSCOPY;  Surgeon: Toledo, Alphonsus Jeans, MD;  Location: [facility] ENDOSCOPY;  Service: Gastroenterology;  Laterality: N/A;   ELECTROPHYSIOLOGIC STUDY N/A 08/16/2014   Procedure: CARDIOVERSION;  Surgeon: Devorah Fonder, MD;  Location: [facility] ORS;  Service: Cardiovascular;  Laterality: N/A;   ESOPHAGOGASTRODUODENOSCOPY (EGD) WITH PROPOFOL  N/A 05/04/2022   Procedure: ESOPHAGOGASTRODUODENOSCOPY (EGD) WITH PROPOFOL ;  Surgeon: Selena Daily, MD;  Location: [facility] ENDOSCOPY;  Service: Gastroenterology;  Laterality: N/A;   FLEXIBLE SIGMOIDOSCOPY N/A 10/10/2015   Procedure: FLEXIBLE SIGMOIDOSCOPY;  Surgeon: Deveron Fly, MD;  Location: [facility] ENDOSCOPY;  Service: Endoscopy;  Laterality: N/A;   KNEE SURGERY Right    NM MYOVIEW  LTD  November 2011   No ischemia or infarction. EF 50-55%  ( no improvement from 2009 Myoview  EF of 28%   RIGHT HEART CATH N/A 04/28/2022   Procedure: RIGHT HEART CATH;  Surgeon: Sammy Crisp, MD;  Location: [facility] INVASIVE CV LAB;  Service: Cardiovascular;  Laterality: N/A;   RIGHT HEART CATH N/A 06/23/2023   Procedure: RIGHT HEART CATH;  Surgeon: Darlis Eisenmenger, MD;  Location: [facility] INVASIVE CV LAB;  Service: Cardiovascular;  Laterality: N/A;   TEE WITHOUT CARDIOVERSION N/A 08/16/2014   Procedure: TRANSESOPHAGEAL ECHOCARDIOGRAM (TEE);  Surgeon: Devorah Fonder, MD;  Location: [facility] ORS;  Service: Cardiovascular;  Laterality: N/A;   TEE WITHOUT CARDIOVERSION N/A 03/05/2021   Procedure: TRANSESOPHAGEAL ECHOCARDIOGRAM (TEE);  Surgeon: Constancia Delton, MD;  Location: [facility] ORS;  Service: Cardiovascular;  Laterality: N/A;   TEE WITHOUT CARDIOVERSION N/A 05/01/2022   Procedure: TRANSESOPHAGEAL ECHOCARDIOGRAM;  Surgeon: Darlis Eisenmenger, MD;  Location: [facility] ORS;  Service: Cardiovascular;  Laterality: N/A;  TEE with cardioversion   TEE WITHOUT CARDIOVERSION N/A 01/19/2023   Procedure: TRANSESOPHAGEAL ECHOCARDIOGRAM (TEE);  Surgeon: Darlis Eisenmenger, MD;  Location: [facility] ORS;  Service: Cardiovascular;  Laterality: N/A;    Family History: Family History  Problem Relation Age of Onset   Hypertension Mother    Hyperlipidemia Mother    Diabetes Mother     Social History: Social History   Socioeconomic History   Marital status: Single    Spouse name: Not on file   Number of children: Not on file   Years of education: Not on file   Highest education level: Not on file  Occupational History   Not on file  Tobacco Use   Smoking status: Former    Current packs/day: 0.00    Average packs/day: 1 pack/day for 12.0 years (12.0 ttl pk-yrs)    Types: Cigarettes    Start date: 02/23/1986    Quit date: 02/23/1998    Years since quitting: 25.4   Smokeless tobacco: Never  Vaping Use   Vaping status: Never Used  Substance and Sexual Activity   Alcohol use: Not  Currently    Alcohol/week: 0.0 standard drinks of alcohol    Comment: Past heavy drinker   Drug use: No   Sexual activity: Not Currently  Other Topics Concern   Not on file  Social History Narrative   Not on file   Social Drivers of Health   Financial Resource Strain: Low Risk  (01/19/2023)  Overall Financial Resource Strain (CARDIA)    Difficulty of Paying Living Expenses: Not hard at all  Food Insecurity: No Food Insecurity (06/21/2023)   Hunger Vital Sign    Worried About Running Out of Food in the Last Year: Never true    Ran Out of Food in the Last Year: Never true  Transportation Needs: No Transportation Needs (06/21/2023)   PRAPARE - Administrator, Civil Service (Medical): No    Lack of Transportation (Non-Medical): No  Physical Activity: Inactive (01/04/2019)   Exercise Vital Sign    Days of Exercise per Week: 0 days    Minutes of Exercise per Session: 0 min  Stress: No Stress Concern Present (01/04/2019)   [employer] of Occupational Health - Occupational Stress Questionnaire    Feeling of Stress : Not at all  Social Connections: Moderately Isolated (01/04/2019)   Social Connection and Isolation Panel [NHANES]    Frequency of Communication with Friends and Family: More than three times a week    Frequency of Social Gatherings with Friends and Family: More than three times a week    Attends Religious Services: 1 to 4 times per year    Active Member of [employer] or Organizations: No    Attends Banker Meetings: Never    Marital Status: Never married    Allergies:  Allergies  Allergen Reactions   Esomeprazole Magnesium  Other (See Comments)    Suspected interstitial nephritis 2018   Egg-Derived Products Rash    Objective:    Vital Signs:   Temp:  [98.3 F (36.8 C)-98.4 F (36.9 C)] 98.3 F (36.8 C) (06/04 1321) Pulse Rate:  [61-143] 143 (06/04 1230) Resp:  [14-36] 25 (06/04 1230) BP: (108-113)/(87-102) 113/102 (06/04 1230) SpO2:   [95 %-97 %] 97 % (06/04 1230) Weight:  [136.1 kg] 136.1 kg (06/04 0936)    Weight change: Filed Weights   07/28/23 0936  Weight: 136.1 kg    Intake/Output:   Intake/Output Summary (Last 24 hours) at 07/28/2023 1610 Last data filed at 07/28/2023 1210 Gross per 24 hour  Intake --  Output 500 ml  Net -500 ml      Physical Exam    General:  Lying flat in bed No resp difficulty HEENT: normal Neck: supple. JVP hard to see . Carotids 2+ bilat; no bruits. No lymphadenopathy or thyromegaly appreciated. Cor: Irregular rate & rhythm. No rubs, gallops or murmurs. Lungs: clear Abdomen: obese soft, nontender, nondistended. No hepatosplenomegaly. No bruits or masses. Good bowel sounds. Extremities: no cyanosis, clubbing, rash, edema Neuro: alert & orientedx3, cranial nerves grossly intact. moves all 4 extremities w/o difficulty. Affect pleasant   Telemetry   AFL 80s Personally reviewed  EKG    Atypical AFL versus 130 LBBB (151 ms) Personally reviewed  Labs   Basic Metabolic Panel: Recent Labs  Lab 07/28/23 0940  NA 136  K 4.0  CL 106  CO2 22  GLUCOSE 127*  BUN 44*  CREATININE 3.55*  CALCIUM  8.2*    Liver Function Tests: No results for input(s): "AST", "ALT", "ALKPHOS", "BILITOT", "PROT", "ALBUMIN" in the last 168 hours. No results for input(s): "LIPASE", "AMYLASE" in the last 168 hours. No results for input(s): "AMMONIA" in the last 168 hours.  CBC: Recent Labs  Lab 07/28/23 0940  WBC 8.5  HGB 12.9*  HCT 41.4  MCV 84.3  PLT 221    Cardiac Enzymes: No results for input(s): "CKTOTAL", "CKMB", "CKMBINDEX", "TROPONINI" in the last 168 hours.  BNP: BNP (  last 3 results) Recent Labs    06/21/23 0250 07/13/23 1549 07/28/23 0940  BNP 756.8* 531.8* 826.8*    ProBNP (last 3 results) No results for input(s): "PROBNP" in the last 8760 hours.   CBG: No results for input(s): "GLUCAP" in the last 168 hours.  Coagulation Studies: Recent Labs     07/28/23 0940  LABPROT 17.6*  INR 1.4*     Imaging   CT Head Wo Contrast Result Date: 07/28/2023 CLINICAL DATA:  Fall, head injury EXAM: CT HEAD WITHOUT CONTRAST CT CERVICAL SPINE WITHOUT CONTRAST TECHNIQUE: Multidetector CT imaging of the head and cervical spine was performed following the standard protocol without intravenous contrast. Multiplanar CT image reconstructions of the cervical spine were also generated. RADIATION DOSE REDUCTION: This exam was performed according to the departmental dose-optimization program which includes automated exposure control, adjustment of the mA and/or kV according to patient size and/or use of iterative reconstruction technique. COMPARISON:  None Available. FINDINGS: CT HEAD FINDINGS Brain: No evidence of acute infarction, hemorrhage, hydrocephalus, extra-axial collection or mass lesion/mass effect. Periventricular and deep white matter hypodensity. Vascular: No hyperdense vessel or unexpected calcification. Skull: Normal. Negative for fracture or focal lesion. Sinuses/Orbits: No acute finding. Other: Soft tissue laceration of the right frontal scalp (series 2, image 21). CT CERVICAL SPINE FINDINGS Alignment: Positional straightening and reversal of the normal cervical lordosis. Skull base and vertebrae: No acute fracture. No primary bone lesion or focal pathologic process. Soft tissues and spinal canal: No prevertebral fluid or swelling. No visible canal hematoma. Disc levels:  Intact. Upper chest: Negative. Other: None. IMPRESSION: 1. No acute intracranial pathology. 2. Extensive periventricular and deep white matter hypodensity, which may reflect age advanced small-vessel white matter disease or demyelinating disorder. 3. Soft tissue laceration of the right frontal scalp. 4. No fracture or static subluxation of the cervical spine. Electronically Signed   By: Fredricka Jenny M.D.   On: 07/28/2023 11:19   CT Cervical Spine Wo Contrast Result Date: 07/28/2023 CLINICAL  DATA:  Fall, head injury EXAM: CT HEAD WITHOUT CONTRAST CT CERVICAL SPINE WITHOUT CONTRAST TECHNIQUE: Multidetector CT imaging of the head and cervical spine was performed following the standard protocol without intravenous contrast. Multiplanar CT image reconstructions of the cervical spine were also generated. RADIATION DOSE REDUCTION: This exam was performed according to the departmental dose-optimization program which includes automated exposure control, adjustment of the mA and/or kV according to patient size and/or use of iterative reconstruction technique. COMPARISON:  None Available. FINDINGS: CT HEAD FINDINGS Brain: No evidence of acute infarction, hemorrhage, hydrocephalus, extra-axial collection or mass lesion/mass effect. Periventricular and deep white matter hypodensity. Vascular: No hyperdense vessel or unexpected calcification. Skull: Normal. Negative for fracture or focal lesion. Sinuses/Orbits: No acute finding. Other: Soft tissue laceration of the right frontal scalp (series 2, image 21). CT CERVICAL SPINE FINDINGS Alignment: Positional straightening and reversal of the normal cervical lordosis. Skull base and vertebrae: No acute fracture. No primary bone lesion or focal pathologic process. Soft tissues and spinal canal: No prevertebral fluid or swelling. No visible canal hematoma. Disc levels:  Intact. Upper chest: Negative. Other: None. IMPRESSION: 1. No acute intracranial pathology. 2. Extensive periventricular and deep white matter hypodensity, which may reflect age advanced small-vessel white matter disease or demyelinating disorder. 3. Soft tissue laceration of the right frontal scalp. 4. No fracture or static subluxation of the cervical spine. Electronically Signed   By: Fredricka Jenny M.D.   On: 07/28/2023 11:19   DG Chest [facility]  Result Date: 07/28/2023 CLINICAL DATA:  Shortness of breath EXAM: PORTABLE CHEST 1 VIEW COMPARISON:  June 21, 2023 FINDINGS: Bilateral reticular  interstitial infiltrates correlate with congestive changes without consolidations. No pleural effusions Heart and mediastinum in the upper limits of normal with a left ventricular configuration IMPRESSION: Bilateral reticular interstitial infiltrates correlate with congestive changes without consolidations. Electronically Signed   By: Fredrich Jefferson M.D.   On: 07/28/2023 10:05     Medications:     Current Medications:  acetaminophen   500 mg Oral Once   amiodarone   150 mg Intravenous Once   insulin  aspart  0-15 Units Subcutaneous TID WC   ipratropium-albuterol   3 mL Nebulization Q6H   predniSONE   40 mg Oral Q breakfast   sodium chloride  flush  3 mL Intravenous Q12H    Infusions:  sodium chloride      amiodarone      Followed by   amiodarone         Assessment/Plan    1. Syncopal episode x 2 - suspect my be related to AFL but also at high-risk for VT - watch on tele - start IV amio - repeat echo.  - no driving x 6 months - has not been ICD candidate due to poor compliance - consider monitor vs LifeVest on d/c  2. 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 Not interested in Palliative Care referral - BNP and CXR suggestive of volume overload but looks well-compensated on exam - Check lactic acid  - Has gotten 1 dose of IV lasix  in ER will follow response  - GDMT limited by CKD IV. Restart as able   3. Paroxysmal atrial fibrillation/atrial flutter- - TEE/DC-CV 3/24  - Last DC-CV 11/24 - back in AFL with RVR  - reload amio.  - consider TEE/DC-CV prior to discharge -  continue apixaban  5 mg twice daily for CHA2DS2-VASc score of at least 6 - Previously seen by EP -> not candidate for ablation   CKD stage IV- - Baseline Scr 2.8-3.4 - SCr 3.55 today. Follow  4. Nonobstructive coronary artery disease- - Cath 2016 - saw cardiology ([place]) 04/24 - continue atorvastatin  80mg  daily - LDL 01/09/22 was 88   5. Obstructive sleep apnea- - does not wear CPAP and says that he hasn't worn it in "forever" and not interested in trying to wear it at this time - last sleep study done 12/2015   6. Type 2 diabetes- -A1c 7.7% on 01/17/23 -Continue SGLT2i -Per PCP    Length of Stay: 0  Jules Oar, MD  07/28/2023, 4:10 PM    Advanced Heart Failure Team Pager [phone] (M-F; 7a - 5p)  Please contact [facility] for night-coverage after hours (4p -7a ) and weekends on [email or web address]

## 2023-07-29 ENCOUNTER — Telehealth (HOSPITAL_COMMUNITY): Payer: Self-pay | Admitting: Pharmacy Technician

## 2023-07-29 ENCOUNTER — Other Ambulatory Visit (HOSPITAL_COMMUNITY): Payer: Self-pay

## 2023-07-29 ENCOUNTER — Inpatient Hospital Stay (HOSPITAL_COMMUNITY)
Admit: 2023-07-29 | Discharge: 2023-07-29 | Disposition: A | Discharge status: Home / Self Care | Attending: Internal Medicine | Admitting: Internal Medicine

## 2023-07-29 DIAGNOSIS — I2609 Other pulmonary embolism with acute cor pulmonale: Secondary | ICD-10-CM | POA: Diagnosis not present

## 2023-07-29 DIAGNOSIS — R55 Syncope and collapse: Secondary | ICD-10-CM

## 2023-07-29 LAB — RESPIRATORY PANEL BY PCR

## 2023-07-29 LAB — BASIC METABOLIC PANEL WITH GFR
Anion gap: 9 (ref 5–15)
BUN: 52 mg/dL — ABNORMAL HIGH (ref 6–20)
CO2: 21 mmol/L — ABNORMAL LOW (ref 22–32)
Calcium: 8.3 mg/dL — ABNORMAL LOW (ref 8.9–10.3)
Chloride: 104 mmol/L (ref 98–111)
Creatinine, Ser: 3.93 mg/dL — ABNORMAL HIGH (ref 0.61–1.24)
GFR, Estimated: 17 mL/min — ABNORMAL LOW (ref >60)
Glucose, Bld: 203 mg/dL — ABNORMAL HIGH (ref 70–99)
Potassium: 4.2 mmol/L (ref 3.5–5.1)
Sodium: 134 mmol/L — ABNORMAL LOW (ref 135–145)

## 2023-07-29 LAB — CBC WITH DIFFERENTIAL/PLATELET
Abs Immature Granulocytes: 0.02 10*3/uL (ref 0.00–0.07)
Basophils Absolute: 0 10*3/uL (ref 0.0–0.1)
Basophils Relative: 0 %
Eosinophils Absolute: 0 10*3/uL (ref 0.0–0.5)
Eosinophils Relative: 0 %
HCT: 40.4 % (ref 39.0–52.0)
Hemoglobin: 13.1 g/dL (ref 13.0–17.0)
Immature Granulocytes: 0 %
Lymphocytes Relative: 12 %
Lymphs Abs: 0.8 10*3/uL (ref 0.7–4.0)
MCH: 27.2 pg (ref 26.0–34.0)
MCHC: 32.4 g/dL (ref 30.0–36.0)
MCV: 83.8 fL (ref 80.0–100.0)
Monocytes Absolute: 0.2 10*3/uL (ref 0.1–1.0)
Monocytes Relative: 3 %
Neutro Abs: 5.5 10*3/uL (ref 1.7–7.7)
Neutrophils Relative %: 85 %
Platelets: 230 10*3/uL (ref 150–400)
RBC: 4.82 MIL/uL (ref 4.22–5.81)
RDW: 18 % — ABNORMAL HIGH (ref 11.5–15.5)
WBC: 6.6 10*3/uL (ref 4.0–10.5)
nRBC: 0 % (ref 0.0–0.2)

## 2023-07-29 LAB — ECHOCARDIOGRAM COMPLETE
AR max vel: 3.57 cm2
AV Area VTI: 4.83 cm2
AV Area mean vel: 2.75 cm2
AV Mean grad: 2 mmHg
AV Peak grad: 3.1 mmHg
Ao pk vel: 0.88 m/s
Area-P 1/2: 3.13 cm2
Height: 71 in
S' Lateral: 4.7 cm
Weight: 4470.4 [oz_av]

## 2023-07-29 LAB — LACTIC ACID, PLASMA: Lactic Acid, Venous: 1.1 mmol/L (ref 0.5–1.9)

## 2023-07-29 LAB — RESP PANEL BY RT-PCR (RSV, FLU A&B, COVID)  RVPGX2
Influenza A by PCR: NEGATIVE
Influenza B by PCR: NEGATIVE
Resp Syncytial Virus by PCR: NEGATIVE
SARS Coronavirus 2 by RT PCR: NEGATIVE

## 2023-07-29 LAB — MAGNESIUM: Magnesium: 2 mg/dL (ref 1.7–2.4)

## 2023-07-29 LAB — T4, FREE: Free T4: 0.71 ng/dL (ref 0.61–1.12)

## 2023-07-29 LAB — GLUCOSE, CAPILLARY: Glucose-Capillary: 104 mg/dL — ABNORMAL HIGH (ref 70–99)

## 2023-07-29 MED ORDER — TRAMADOL HCL 50 MG PO TABS
50.0000 mg | ORAL_TABLET | Freq: Four times a day (QID) | ORAL | Status: DC | PRN
  Administered 2023-07-29 – 2023-08-02 (×3): 50 mg via ORAL
  Filled 2023-07-29 (×3): qty 1

## 2023-07-29 MED ORDER — SODIUM CHLORIDE 0.9 % IV SOLN: 250.0000 mL | INTRAVENOUS | Status: AC | PRN

## 2023-07-29 MED ORDER — ACETAMINOPHEN 500 MG PO TABS
1000.0000 mg | ORAL_TABLET | Freq: Four times a day (QID) | ORAL | Status: DC | PRN
  Administered 2023-07-29 – 2023-08-02 (×3): 1000 mg via ORAL
  Filled 2023-07-29 (×3): qty 2

## 2023-07-29 NOTE — Progress Notes (Signed)
 PROGRESS NOTE    Hector [name]  MVH:[id number] DOB: March 10, 1965 DOA: 07/28/2023 PCP: Dionicia Frater, MD  [id number]/[id number]  LOS: 1 day   Brief hospital course:   Assessment & Plan: Hector [name] is a 58 y.o. male with medical history significant of HFrEF, atrial fibrillation on Eliquis , type 2 diabetes, OSA not compliant with CPAP, CKD stage IV, alcohol use disorder in remission, nonobstructive CAD, who presents to the ED due to ground-level fall.   Hector [name] states that approximately 5 days ago, he began to come down with a cold, endorsing productive cough and shortness of breath.  He endorses poor appetite, headache that began at the same time.  Due to poor appetite, he did not take any of his medicines until 2 days ago.  He notes that he restarted on because he was having palpitations and felt as though he was putting weight on.  Then today, he felt weak all over.   On EMS arrival, patient was noted to be in atrial fibrillation with RVR with low blood pressure.    * Syncope Patient is presenting with a syncopal episode with loss of consciousness leading to a right frontal scalp laceration.  Unclear etiology, suspect my be related to AFL but also at high-risk for VT.  Has not been ICD candidate due to poor compliance.  Refuses LifeVest. --Zio at discharge --cont tele  Acute on chronic HFrEF (heart failure with reduced ejection fraction) (HCC) Long history of HFrEF with EF < 20% in the setting of non-ischemic cardiomyopathy. He does not appear profoundly hypervolemic, however BNP is elevated and there is concern for pulmonary edema on CXR. He notes he did not take Torsemide  for several days.  but looks well-compensated on exam, per cardio. --Cr went up with diuresis --hold diuretic - GDMT limited by CKD IV. Restart as able   Atrial flutter with RVR (HCC) Medication non-compliance - TEE/DC-CV 3/24  - Last DC-CV 11/24 - Previously seen by EP -> not candidate for  ablation  - back in AFL with RVR  --cont amio gtt for reloading - plan TEE/DC-CV tomorrow am. --cont Eliquis   COPD with acute exacerbation (HCC) Coronavirus OC43 URI Patient reports increased cough and shortness of breath, in addition to poor p.o. intake, body aches and headache that began 5 days ago.  RVP pos for Coronavirus OC43.  Started on prednisone  and DuoNeb --cont prednisone  and DuoNeb  Type 2 diabetes mellitus (HCC) --A1c 7.3 --d/c BG checks and SSI  Hypothyroidism --cont Synthroid   Chronic renal impairment, stage 4 (severe) (HCC) History of CKD Stage IV with creatinine ranging from 3.1 up to 4, currently 3.55.  Alcohol use, unspecified, in remission - Daily folic acid , thiamine  and multivitamin  OSA (obstructive sleep apnea) Patient reports noncompliance with CPAP.   DVT prophylaxis: On:Eliquis  Code Status: Full code  Family Communication:  Level of care: Telemetry Cardiac Dispo:   The patient is from: home Anticipated d/c is to: home Anticipated d/c date is: 1-2 days   Subjective and Interval History:  Pt reported breathing ok.  Complained of pain at the top and top right of his head where he had fallen.   Objective: Vitals:   07/29/23 0355 07/29/23 0600 07/29/23 0821 07/29/23 1252  BP: 101/74  (!) 117/95 107/86  Pulse: 74  (!) 58 97  Resp: 19  14 14   Temp: 97.9 F (36.6 C)  97.7 F (36.5 C) 97.8 F (36.6 C)  TempSrc: Oral  Oral Oral  SpO2: 96%  97% 94%  Weight:  126.7 kg    Height:        Intake/Output Summary (Last 24 hours) at 07/29/2023 1753 Last data filed at 07/29/2023 1600 Gross per 24 hour  Intake 531.12 ml  Output 250 ml  Net 281.12 ml   Filed Weights   07/28/23 0936 07/29/23 0600  Weight: 136.1 kg 126.7 kg    Examination:   Constitutional: NAD, AAOx3 HEENT: conjunctivae and lids normal, EOMI CV: No cyanosis.   RESP: normal respiratory effort, on RA Extremities: some edema in BLE SKIN: warm, dry Neuro: II - XII grossly  intact.     Data Reviewed: I have personally reviewed labs and imaging studies  Time spent: 50 minutes  [name], MD [facility] If 7PM-7AM, please contact night-coverage 07/29/2023, 5:53 PM

## 2023-07-29 NOTE — Progress Notes (Signed)
*  PRELIMINARY RESULTS* Echocardiogram 2D Echocardiogram has been performed.  Hector [name] 07/29/2023, 2:43 PM

## 2023-07-29 NOTE — Evaluation (Signed)
 Physical Therapy Evaluation Patient Details Name: AUDWIN SEMPER MRN: [id number] DOB: 1966-01-30 Today's Date: 07/29/2023  History of Present Illness  Pt is a 58 y.o. male here after a fall at home; recently here with abdominal pain, acute on chronic HFrEF. PMH of heart failure with reduced ejection fraction with EF less than 20%, nonischemic cardiomyopathy, history of alcohol use, paroxysmal atrial fibrillation/flutter on Eliquis , struct of sleep apnea on CPAP, chronic kidney disease stage IV, CVA, hypertension, hyperlipidemia, type 2 diabetes mellitus  Clinical Impression  Pt napping on arrival but willing to work with PT and show that he is mobile and able to go home.  Ultimately he did very well with this and showed good mobility, transfers and ambulated ~100 ft in the room w/o AD and multiple turns, etc w/o issue.  Pt reports feeling at his baseline, mild fatigue with HR to the 110s.  Pt reports he does not recall events leading to his fall at home, "I guess I passed out" but otherwise feels at his baseline, confident with mobility and reports no desire for further PT intervention.  This PT agrees that he does not need further PT her or at home, will complete orders..        If plan is discharge home, recommend the following: Assist for transportation   Can travel by private vehicle        Equipment Recommendations None recommended by PT  Recommendations for Other Services       Functional Status Assessment Patient has not had a recent decline in their functional status     Precautions / Restrictions Precautions Precautions: Fall Restrictions Weight Bearing Restrictions Per Provider Order: No      Mobility  Bed Mobility Overal bed mobility: Independent             General bed mobility comments: easily transitions to/from sitting    Transfers Overall transfer level: Independent Equipment used: None               General transfer comment: able to rise w/o  assist, no need for AD, good balance/confidence    Ambulation/Gait Ambulation/Gait assistance: Supervision Gait Distance (Feet): 100 Feet Assistive device: None         General Gait Details: Pt was able to ambulate with good confidence and safety, managed turns and tight areas w/o issue  Stairs            Wheelchair Mobility     Tilt Bed    Modified Rankin (Stroke Patients Only)       Balance Overall balance assessment: Independent                                           Pertinent Vitals/Pain Pain Assessment Pain Assessment: No/denies pain    Home Living Family/patient expects to be discharged to:: Private residence Living Arrangements: Parent;Other relatives (mother and brother) Available Help at Discharge: Family Type of Home: House Home Access: Ramped entrance       Home Layout: One level Home Equipment: None      Prior Function Prior Level of Function : Independent/Modified Independent             Mobility Comments: IND without AD household distances ADLs Comments: reports IND with ADL management; brother drives him to MD appts, he does not go to grocery store     Extremity/Trunk Assessment  Upper Extremity Assessment Upper Extremity Assessment: Overall WFL for tasks assessed    Lower Extremity Assessment Lower Extremity Assessment: Overall WFL for tasks assessed       Communication   Communication Communication: No apparent difficulties    Cognition Arousal: Alert Behavior During Therapy: WFL for tasks assessed/performed   PT - Cognitive impairments: No apparent impairments                         Following commands: Intact       Cueing       General Comments General comments (skin integrity, edema, etc.): Pt reports feeling close to his baseline    Exercises     Assessment/Plan    PT Assessment Patient does not need any further PT services  PT Problem List         PT Treatment  Interventions      PT Goals (Current goals can be found in the Care Plan section)  Acute Rehab PT Goals Patient Stated Goal: go home tomorrow PT Goal Formulation: With patient Time For Goal Achievement: 08/11/23 Potential to Achieve Goals: Good    Frequency       Co-evaluation               AM-PAC PT "6 Clicks" Mobility  Outcome Measure Help needed turning from your back to your side while in a flat bed without using bedrails?: None Help needed moving from lying on your back to sitting on the side of a flat bed without using bedrails?: None Help needed moving to and from a bed to a chair (including a wheelchair)?: None Help needed standing up from a chair using your arms (e.g., wheelchair or bedside chair)?: None Help needed to walk in hospital room?: None Help needed climbing 3-5 steps with a railing? : None 6 Click Score: 24    End of Session   Activity Tolerance: Patient tolerated treatment well Patient left: in bed;with call bell/phone within reach Nurse Communication: Mobility status PT Visit Diagnosis: History of falling (Z91.81)    Time: 4098-1191 PT Time Calculation (min) (ACUTE ONLY): 13 min   Charges:   PT Evaluation $PT Eval Low Complexity: 1 Low   PT General Charges $$ ACUTE PT VISIT: 1 Visit         Darice Edelman, DPT 07/29/2023, 3:41 PM

## 2023-07-29 NOTE — Progress Notes (Signed)
 Heart Failure Navigator Progress Note  Assessed for Heart & Vascular TOC clinic readiness.  Does not meet criteria due to current Advanced Heart Failure Team patient of Jules Oar, MD.  Navigator will sign off at this time.  Celedonio Coil, RN, BSN [facility] Heart Failure Navigator Secure Chat Only

## 2023-07-29 NOTE — H&P (View-Only) (Signed)
 Advanced Heart Failure Rounding Note   Subjective:    Denies CP or SOB.   Scr up to 3.9  Remains in AFL with controlled VR.   VQ + for possible RLL PE    Objective:   Weight Range:  Vital Signs:   Temp:  [97.7 F (36.5 C)-98.7 F (37.1 C)] 97.7 F (36.5 C) (06/05 0821) Pulse Rate:  [27-143] 58 (06/05 0821) Resp:  [14-36] 14 (06/05 0821) BP: (90-148)/(70-123) 117/95 (06/05 0821) SpO2:  [92 %-100 %] 97 % (06/05 0821) Weight:  [126.7 kg-136.1 kg] 126.7 kg (06/05 0600)    Weight change: Filed Weights   07/28/23 0936 07/29/23 0600  Weight: 136.1 kg 126.7 kg    Intake/Output:   Intake/Output Summary (Last 24 hours) at 07/29/2023 0910 Last data filed at 07/28/2023 2156 Gross per 24 hour  Intake 3 ml  Output 1050 ml  Net -1047 ml     Physical Exam: General:  Sitting up on side of bed No resp difficulty HEENT: normal Neck: supple. JVP 7. Carotids 2+ bilat; no bruits. No lymphadenopathy or thryomegaly appreciated. Cor: PMI nondisplaced. Irreg  No rubs, gallops or murmurs. Lungs: clear Abdomen: obese soft, nontender, nondistended. No hepatosplenomegaly. No bruits or masses. Good bowel sounds. Extremities: no cyanosis, clubbing, rash, edema Neuro: alert & orientedx3, cranial nerves grossly intact. moves all 4 extremities w/o difficulty. Affect pleasant  Telemetry: AFL 70-80s Personally reviewed  Labs: Basic Metabolic Panel: Recent Labs  Lab 07/28/23 0940 07/29/23 0409  NA 136 134*  K 4.0 4.2  CL 106 104  CO2 22 21*  GLUCOSE 127* 203*  BUN 44* 52*  CREATININE 3.55* 3.93*  CALCIUM  8.2* 8.3*  MG  --  2.0    Liver Function Tests: No results for input(s): "AST", "ALT", "ALKPHOS", "BILITOT", "PROT", "ALBUMIN" in the last 168 hours. No results for input(s): "LIPASE", "AMYLASE" in the last 168 hours. No results for input(s): "AMMONIA" in the last 168 hours.  CBC: Recent Labs  Lab 07/28/23 0940 07/29/23 0409  WBC 8.5 6.6  NEUTROABS  --  5.5  HGB  12.9* 13.1  HCT 41.4 40.4  MCV 84.3 83.8  PLT 221 230    Cardiac Enzymes: No results for input(s): "CKTOTAL", "CKMB", "CKMBINDEX", "TROPONINI" in the last 168 hours.  BNP: BNP (last 3 results) Recent Labs    06/21/23 0250 07/13/23 1549 07/28/23 0940  BNP 756.8* 531.8* 826.8*    ProBNP (last 3 results) No results for input(s): "PROBNP" in the last 8760 hours.    Other results:  Imaging: NM Pulmonary Perfusion Result Date: 07/28/2023 CLINICAL DATA:  [facility]-NMDeep venous thrombosis (DVT) EXAM: NUCLEAR MEDICINE PERFUSION LUNG SCAN TECHNIQUE: Perfusion images were obtained in multiple projections after intravenous injection of radiopharmaceutical. RADIOPHARMACEUTICALS:  4.3 mCi Tc-31m MAA COMPARISON:  Chest radiograph 07/28/2023 FINDINGS: Single wedge-shaped peripheral perfusion defect within the posterior lateral RIGHT lower lobe seen only on posterior projection. No additional defects identified. IMPRESSION: Concern for RIGHT lower lobe pulmonary embolism. Electronically Signed   By: Deboraha Fallow M.D.   On: 07/28/2023 16:31   CT Head Wo Contrast Result Date: 07/28/2023 CLINICAL DATA:  Fall, head injury EXAM: CT HEAD WITHOUT CONTRAST CT CERVICAL SPINE WITHOUT CONTRAST TECHNIQUE: Multidetector CT imaging of the head and cervical spine was performed following the standard protocol without intravenous contrast. Multiplanar CT image reconstructions of the cervical spine were also generated. RADIATION DOSE REDUCTION: This exam was performed according to the departmental dose-optimization program which includes automated exposure control, adjustment of  the mA and/or kV according to patient size and/or use of iterative reconstruction technique. COMPARISON:  None Available. FINDINGS: CT HEAD FINDINGS Brain: No evidence of acute infarction, hemorrhage, hydrocephalus, extra-axial collection or mass lesion/mass effect. Periventricular and deep white matter hypodensity. Vascular: No hyperdense vessel  or unexpected calcification. Skull: Normal. Negative for fracture or focal lesion. Sinuses/Orbits: No acute finding. Other: Soft tissue laceration of the right frontal scalp (series 2, image 21). CT CERVICAL SPINE FINDINGS Alignment: Positional straightening and reversal of the normal cervical lordosis. Skull base and vertebrae: No acute fracture. No primary bone lesion or focal pathologic process. Soft tissues and spinal canal: No prevertebral fluid or swelling. No visible canal hematoma. Disc levels:  Intact. Upper chest: Negative. Other: None. IMPRESSION: 1. No acute intracranial pathology. 2. Extensive periventricular and deep white matter hypodensity, which may reflect age advanced small-vessel white matter disease or demyelinating disorder. 3. Soft tissue laceration of the right frontal scalp. 4. No fracture or static subluxation of the cervical spine. Electronically Signed   By: Fredricka Jenny M.D.   On: 07/28/2023 11:19   CT Cervical Spine Wo Contrast Result Date: 07/28/2023 CLINICAL DATA:  Fall, head injury EXAM: CT HEAD WITHOUT CONTRAST CT CERVICAL SPINE WITHOUT CONTRAST TECHNIQUE: Multidetector CT imaging of the head and cervical spine was performed following the standard protocol without intravenous contrast. Multiplanar CT image reconstructions of the cervical spine were also generated. RADIATION DOSE REDUCTION: This exam was performed according to the departmental dose-optimization program which includes automated exposure control, adjustment of the mA and/or kV according to patient size and/or use of iterative reconstruction technique. COMPARISON:  None Available. FINDINGS: CT HEAD FINDINGS Brain: No evidence of acute infarction, hemorrhage, hydrocephalus, extra-axial collection or mass lesion/mass effect. Periventricular and deep white matter hypodensity. Vascular: No hyperdense vessel or unexpected calcification. Skull: Normal. Negative for fracture or focal lesion. Sinuses/Orbits: No acute  finding. Other: Soft tissue laceration of the right frontal scalp (series 2, image 21). CT CERVICAL SPINE FINDINGS Alignment: Positional straightening and reversal of the normal cervical lordosis. Skull base and vertebrae: No acute fracture. No primary bone lesion or focal pathologic process. Soft tissues and spinal canal: No prevertebral fluid or swelling. No visible canal hematoma. Disc levels:  Intact. Upper chest: Negative. Other: None. IMPRESSION: 1. No acute intracranial pathology. 2. Extensive periventricular and deep white matter hypodensity, which may reflect age advanced small-vessel white matter disease or demyelinating disorder. 3. Soft tissue laceration of the right frontal scalp. 4. No fracture or static subluxation of the cervical spine. Electronically Signed   By: Fredricka Jenny M.D.   On: 07/28/2023 11:19   DG Chest Port 1 View Result Date: 07/28/2023 CLINICAL DATA:  Shortness of breath EXAM: PORTABLE CHEST 1 VIEW COMPARISON:  June 21, 2023 FINDINGS: Bilateral reticular interstitial infiltrates correlate with congestive changes without consolidations. No pleural effusions Heart and mediastinum in the upper limits of normal with a left ventricular configuration IMPRESSION: Bilateral reticular interstitial infiltrates correlate with congestive changes without consolidations. Electronically Signed   By: Fredrich Jefferson M.D.   On: 07/28/2023 10:05     Medications:     Scheduled Medications:  acetaminophen   500 mg Oral Once   allopurinol   100 mg Oral Daily   apixaban   5 mg Oral BID   atorvastatin   80 mg Oral Daily   ferrous sulfate   325 mg Oral Q breakfast   insulin  aspart  0-15 Units Subcutaneous TID WC   ipratropium-albuterol   3 mL Nebulization BID   levothyroxine   125 mcg Oral Q0600   predniSONE   40 mg Oral Q breakfast   sodium chloride  flush  3 mL Intravenous Q12H    Infusions:  sodium chloride      amiodarone  30 mg/hr (07/29/23 0700)    PRN Medications: sodium chloride ,  acetaminophen , ondansetron  (ZOFRAN ) IV, sodium chloride  flush   Assessment/Plan:    1. Syncopal episode x 2 - suspect my be related to AFL but also at high-risk for VT. VQ + for PE - no events on TEE - continue IV amio - repeat echo pending - no driving x 6 months - has not been ICD candidate due to poor compliance - Refuses LifeVest - Will place Zio AT at d/c   2. 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 Not interested in Palliative Care referral - BNP and CXR suggestive of volume overload but looks well-compensated on exam - Lactic acid  normal - Scr up with diuresis. Hold IV lasix  - GDMT limited by CKD IV. Restart as able   3. Paroxysmal atrial fibrillation/atrial flutter- - TEE/DC-CV 3/24  - Last DC-CV 11/24 - back in AFL with RVR  - reloading amio.  - plan TEE/DC-CV tomorrow am. I d/w him and he is ok with proceeding (Has missed several doses of apixaban  so will need TEE) - continue apixaban  5 mg twice daily for CHA2DS2-VASc score of at least 6 - Previously seen by EP -> not candidate for ablation   CKD stage IV- - Baseline Scr 2.8-3.4 - SCr 3.55 -> 3.9 today. - Hold lasix    4. Nonobstructive coronary artery disease- - Cath 2016 - saw cardiology (D[place]) 04/24 - continue atorvastatin  80mg  daily - LDL 01/09/22 was 88   5. Obstructive sleep apnea- - does not wear CPAP and says that he hasn't worn it in "forever" and not interested in trying to wear it at this time - last sleep study done 12/2015   6. Type 2 diabetes- -A1c 7.7% on 01/17/23 -Continue SGLT2i -Per  PCP  7. Positive VQ - possible RLL PE - continue apixaban  - I think this is equivocal. Will review with Radiology.  - Will not increase Eliquis  at this time       Length of Stay: 1   Jules Oar MD 07/29/2023, 9:10 AM  Advanced Heart Failure Team Pager [phone] (M-F; 7a - 4p)  Please contact [facility] for night-coverage after hours (4p -7a ) and weekends on [email or web address]

## 2023-07-29 NOTE — Progress Notes (Signed)
 Advanced Heart Failure Rounding Note   Subjective:    Denies CP or SOB.   Scr up to 3.9  Remains in AFL with controlled VR.   VQ + for possible RLL PE    Objective:   Weight Range:  Vital Signs:   Temp:  [97.7 F (36.5 C)-98.7 F (37.1 C)] 97.7 F (36.5 C) (06/05 0821) Pulse Rate:  [27-143] 58 (06/05 0821) Resp:  [14-36] 14 (06/05 0821) BP: (90-148)/(70-123) 117/95 (06/05 0821) SpO2:  [92 %-100 %] 97 % (06/05 0821) Weight:  [126.7 kg-136.1 kg] 126.7 kg (06/05 0600)    Weight change: Filed Weights   07/28/23 0936 07/29/23 0600  Weight: 136.1 kg 126.7 kg    Intake/Output:   Intake/Output Summary (Last 24 hours) at 07/29/2023 0910 Last data filed at 07/28/2023 2156 Gross per 24 hour  Intake 3 ml  Output 1050 ml  Net -1047 ml     Physical Exam: General:  Sitting up on side of bed No resp difficulty HEENT: normal Neck: supple. JVP 7. Carotids 2+ bilat; no bruits. No lymphadenopathy or thryomegaly appreciated. Cor: PMI nondisplaced. Irreg  No rubs, gallops or murmurs. Lungs: clear Abdomen: obese soft, nontender, nondistended. No hepatosplenomegaly. No bruits or masses. Good bowel sounds. Extremities: no cyanosis, clubbing, rash, edema Neuro: alert & orientedx3, cranial nerves grossly intact. moves all 4 extremities w/o difficulty. Affect pleasant  Telemetry: AFL 70-80s Personally reviewed  Labs: Basic Metabolic Panel: Recent Labs  Lab 07/28/23 0940 07/29/23 0409  NA 136 134*  K 4.0 4.2  CL 106 104  CO2 22 21*  GLUCOSE 127* 203*  BUN 44* 52*  CREATININE 3.55* 3.93*  CALCIUM  8.2* 8.3*  MG  --  2.0    Liver Function Tests: No results for input(s): "AST", "ALT", "ALKPHOS", "BILITOT", "PROT", "ALBUMIN" in the last 168 hours. No results for input(s): "LIPASE", "AMYLASE" in the last 168 hours. No results for input(s): "AMMONIA" in the last 168 hours.  CBC: Recent Labs  Lab 07/28/23 0940 07/29/23 0409  WBC 8.5 6.6  NEUTROABS  --  5.5  HGB  12.9* 13.1  HCT 41.4 40.4  MCV 84.3 83.8  PLT 221 230    Cardiac Enzymes: No results for input(s): "CKTOTAL", "CKMB", "CKMBINDEX", "TROPONINI" in the last 168 hours.  BNP: BNP (last 3 results) Recent Labs    06/21/23 0250 07/13/23 1549 07/28/23 0940  BNP 756.8* 531.8* 826.8*    ProBNP (last 3 results) No results for input(s): "PROBNP" in the last 8760 hours.    Other results:  Imaging: NM Pulmonary Perfusion Result Date: 07/28/2023 CLINICAL DATA:  [facility]-NMDeep venous thrombosis (DVT) EXAM: NUCLEAR MEDICINE PERFUSION LUNG SCAN TECHNIQUE: Perfusion images were obtained in multiple projections after intravenous injection of radiopharmaceutical. RADIOPHARMACEUTICALS:  4.3 mCi Tc-31m MAA COMPARISON:  Chest radiograph 07/28/2023 FINDINGS: Single wedge-shaped peripheral perfusion defect within the posterior lateral RIGHT lower lobe seen only on posterior projection. No additional defects identified. IMPRESSION: Concern for RIGHT lower lobe pulmonary embolism. Electronically Signed   By: Deboraha Fallow M.D.   On: 07/28/2023 16:31   CT Head Wo Contrast Result Date: 07/28/2023 CLINICAL DATA:  Fall, head injury EXAM: CT HEAD WITHOUT CONTRAST CT CERVICAL SPINE WITHOUT CONTRAST TECHNIQUE: Multidetector CT imaging of the head and cervical spine was performed following the standard protocol without intravenous contrast. Multiplanar CT image reconstructions of the cervical spine were also generated. RADIATION DOSE REDUCTION: This exam was performed according to the departmental dose-optimization program which includes automated exposure control, adjustment of  the mA and/or kV according to patient size and/or use of iterative reconstruction technique. COMPARISON:  None Available. FINDINGS: CT HEAD FINDINGS Brain: No evidence of acute infarction, hemorrhage, hydrocephalus, extra-axial collection or mass lesion/mass effect. Periventricular and deep white matter hypodensity. Vascular: No hyperdense vessel  or unexpected calcification. Skull: Normal. Negative for fracture or focal lesion. Sinuses/Orbits: No acute finding. Other: Soft tissue laceration of the right frontal scalp (series 2, image 21). CT CERVICAL SPINE FINDINGS Alignment: Positional straightening and reversal of the normal cervical lordosis. Skull base and vertebrae: No acute fracture. No primary bone lesion or focal pathologic process. Soft tissues and spinal canal: No prevertebral fluid or swelling. No visible canal hematoma. Disc levels:  Intact. Upper chest: Negative. Other: None. IMPRESSION: 1. No acute intracranial pathology. 2. Extensive periventricular and deep white matter hypodensity, which may reflect age advanced small-vessel white matter disease or demyelinating disorder. 3. Soft tissue laceration of the right frontal scalp. 4. No fracture or static subluxation of the cervical spine. Electronically Signed   By: Fredricka Jenny M.D.   On: 07/28/2023 11:19   CT Cervical Spine Wo Contrast Result Date: 07/28/2023 CLINICAL DATA:  Fall, head injury EXAM: CT HEAD WITHOUT CONTRAST CT CERVICAL SPINE WITHOUT CONTRAST TECHNIQUE: Multidetector CT imaging of the head and cervical spine was performed following the standard protocol without intravenous contrast. Multiplanar CT image reconstructions of the cervical spine were also generated. RADIATION DOSE REDUCTION: This exam was performed according to the departmental dose-optimization program which includes automated exposure control, adjustment of the mA and/or kV according to patient size and/or use of iterative reconstruction technique. COMPARISON:  None Available. FINDINGS: CT HEAD FINDINGS Brain: No evidence of acute infarction, hemorrhage, hydrocephalus, extra-axial collection or mass lesion/mass effect. Periventricular and deep white matter hypodensity. Vascular: No hyperdense vessel or unexpected calcification. Skull: Normal. Negative for fracture or focal lesion. Sinuses/Orbits: No acute  finding. Other: Soft tissue laceration of the right frontal scalp (series 2, image 21). CT CERVICAL SPINE FINDINGS Alignment: Positional straightening and reversal of the normal cervical lordosis. Skull base and vertebrae: No acute fracture. No primary bone lesion or focal pathologic process. Soft tissues and spinal canal: No prevertebral fluid or swelling. No visible canal hematoma. Disc levels:  Intact. Upper chest: Negative. Other: None. IMPRESSION: 1. No acute intracranial pathology. 2. Extensive periventricular and deep white matter hypodensity, which may reflect age advanced small-vessel white matter disease or demyelinating disorder. 3. Soft tissue laceration of the right frontal scalp. 4. No fracture or static subluxation of the cervical spine. Electronically Signed   By: Fredricka Jenny M.D.   On: 07/28/2023 11:19   DG Chest Port 1 View Result Date: 07/28/2023 CLINICAL DATA:  Shortness of breath EXAM: PORTABLE CHEST 1 VIEW COMPARISON:  June 21, 2023 FINDINGS: Bilateral reticular interstitial infiltrates correlate with congestive changes without consolidations. No pleural effusions Heart and mediastinum in the upper limits of normal with a left ventricular configuration IMPRESSION: Bilateral reticular interstitial infiltrates correlate with congestive changes without consolidations. Electronically Signed   By: Fredrich Jefferson M.D.   On: 07/28/2023 10:05     Medications:     Scheduled Medications:  acetaminophen   500 mg Oral Once   allopurinol   100 mg Oral Daily   apixaban   5 mg Oral BID   atorvastatin   80 mg Oral Daily   ferrous sulfate   325 mg Oral Q breakfast   insulin  aspart  0-15 Units Subcutaneous TID WC   ipratropium-albuterol   3 mL Nebulization BID   levothyroxine   125 mcg Oral Q0600   predniSONE   40 mg Oral Q breakfast   sodium chloride  flush  3 mL Intravenous Q12H    Infusions:  sodium chloride      amiodarone  30 mg/hr (07/29/23 0700)    PRN Medications: sodium chloride ,  acetaminophen , ondansetron  (ZOFRAN ) IV, sodium chloride  flush   Assessment/Plan:    1. Syncopal episode x 2 - suspect my be related to AFL but also at high-risk for VT. VQ + for PE - no events on TEE - continue IV amio - repeat echo pending - no driving x 6 months - has not been ICD candidate due to poor compliance - Refuses LifeVest - Will place Zio AT at d/c   2. 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 Not interested in Palliative Care referral - BNP and CXR suggestive of volume overload but looks well-compensated on exam - Lactic acid  normal - Scr up with diuresis. Hold IV lasix  - GDMT limited by CKD IV. Restart as able   3. Paroxysmal atrial fibrillation/atrial flutter- - TEE/DC-CV 3/24  - Last DC-CV 11/24 - back in AFL with RVR  - reloading amio.  - plan TEE/DC-CV tomorrow am. I d/w him and he is ok with proceeding (Has missed several doses of apixaban  so will need TEE) - continue apixaban  5 mg twice daily for CHA2DS2-VASc score of at least 6 - Previously seen by EP -> not candidate for ablation   CKD stage IV- - Baseline Scr 2.8-3.4 - SCr 3.55 -> 3.9 today. - Hold lasix    4. Nonobstructive coronary artery disease- - Cath 2016 - saw cardiology (D[place]) 04/24 - continue atorvastatin  80mg  daily - LDL 01/09/22 was 88   5. Obstructive sleep apnea- - does not wear CPAP and says that he hasn't worn it in "forever" and not interested in trying to wear it at this time - last sleep study done 12/2015   6. Type 2 diabetes- -A1c 7.7% on 01/17/23 -Continue SGLT2i -Per  PCP  7. Positive VQ - possible RLL PE - continue apixaban  - I think this is equivocal. Will review with Radiology.  - Will not increase Eliquis  at this time       Length of Stay: 1   Jules Oar MD 07/29/2023, 9:10 AM  Advanced Heart Failure Team Pager [phone] (M-F; 7a - 4p)  Please contact [facility] for night-coverage after hours (4p -7a ) and weekends on [email or web address]

## 2023-07-29 NOTE — Telephone Encounter (Signed)
 Patient Product/process development scientist completed.    The patient is insured through [facility] MEDICAID.     Ran test claim for Eliquis  5 mg and the current 30 day co-pay is $4.00.  Ran test claim for Jardiance  10 mg and the current 30 day co-pay is $4.00.  Ran test claim for Farxiga  10 mg and Requires Prior Authorization  This test claim was processed through [facility]- copay amounts may vary at other pharmacies due to [employer], or as the patient moves through the different stages of their insurance plan.     Morgan Arab, CPHT Pharmacy Technician III Certified Patient Advocate [facility] Patient Advocate Team Direct Number: [phone]  Fax: [phone]

## 2023-07-30 ENCOUNTER — Inpatient Hospital Stay: Payer: Self-pay | Admitting: Anesthesiology

## 2023-07-30 ENCOUNTER — Encounter: Payer: Self-pay | Admitting: Anesthesiology

## 2023-07-30 ENCOUNTER — Encounter
Admission: EM | Disposition: A | Discharge status: Home / Self Care | Payer: Self-pay | Source: Home / Self Care | Attending: Obstetrics and Gynecology

## 2023-07-30 ENCOUNTER — Inpatient Hospital Stay
Admit: 2023-07-30 | Discharge: 2023-07-30 | Disposition: A | Discharge status: Home / Self Care | Attending: Internal Medicine | Admitting: Internal Medicine

## 2023-07-30 DIAGNOSIS — I4892 Unspecified atrial flutter: Secondary | ICD-10-CM

## 2023-07-30 DIAGNOSIS — I447 Left bundle-branch block, unspecified: Secondary | ICD-10-CM

## 2023-07-30 DIAGNOSIS — I4891 Unspecified atrial fibrillation: Secondary | ICD-10-CM

## 2023-07-30 DIAGNOSIS — R55 Syncope and collapse: Secondary | ICD-10-CM | POA: Diagnosis not present

## 2023-07-30 HISTORY — PX: TEE WITHOUT CARDIOVERSION: SHX5443

## 2023-07-30 HISTORY — PX: CARDIOVERSION: SHX1299

## 2023-07-30 LAB — BASIC METABOLIC PANEL WITH GFR
Anion gap: 9 (ref 5–15)
BUN: 70 mg/dL — ABNORMAL HIGH (ref 6–20)
CO2: 21 mmol/L — ABNORMAL LOW (ref 22–32)
Calcium: 8.4 mg/dL — ABNORMAL LOW (ref 8.9–10.3)
Chloride: 105 mmol/L (ref 98–111)
Creatinine, Ser: 4.41 mg/dL — ABNORMAL HIGH (ref 0.61–1.24)
GFR, Estimated: 15 mL/min — ABNORMAL LOW (ref >60)
Glucose, Bld: 141 mg/dL — ABNORMAL HIGH (ref 70–99)
Potassium: 4.3 mmol/L (ref 3.5–5.1)
Sodium: 135 mmol/L (ref 135–145)

## 2023-07-30 LAB — CBC
HCT: 41.4 % (ref 39.0–52.0)
Hemoglobin: 13.3 g/dL (ref 13.0–17.0)
MCH: 26.9 pg (ref 26.0–34.0)
MCHC: 32.1 g/dL (ref 30.0–36.0)
MCV: 83.6 fL (ref 80.0–100.0)
Platelets: 251 10*3/uL (ref 150–400)
RBC: 4.95 MIL/uL (ref 4.22–5.81)
RDW: 18.1 % — ABNORMAL HIGH (ref 11.5–15.5)
WBC: 9.8 10*3/uL (ref 4.0–10.5)
nRBC: 0 % (ref 0.0–0.2)

## 2023-07-30 LAB — MAGNESIUM: Magnesium: 2.3 mg/dL (ref 1.7–2.4)

## 2023-07-30 LAB — ECHO TEE: Est EF: <20

## 2023-07-30 SURGERY — ECHOCARDIOGRAM, TRANSESOPHAGEAL
Anesthesia: General

## 2023-07-30 MED ORDER — PROPOFOL 1000 MG/100ML IV EMUL
INTRAVENOUS | Status: AC
  Filled 2023-07-30: qty 100

## 2023-07-30 MED ORDER — PROPOFOL 500 MG/50ML IV EMUL
INTRAVENOUS | Status: DC | PRN
  Administered 2023-07-30: 100 ug/kg/min via INTRAVENOUS

## 2023-07-30 MED ORDER — FENTANYL CITRATE (PF) 100 MCG/2ML IJ SOLN
INTRAMUSCULAR | Status: DC | PRN
  Administered 2023-07-30: 50 ug via INTRAVENOUS

## 2023-07-30 MED ORDER — DEXMEDETOMIDINE HCL IN NACL 80 MCG/20ML IV SOLN
INTRAVENOUS | Status: AC
  Filled 2023-07-30: qty 20

## 2023-07-30 MED ORDER — SODIUM CHLORIDE 0.9% FLUSH
3.0000 mL | Freq: Two times a day (BID) | INTRAVENOUS | Status: DC
  Administered 2023-07-30 (×2): 3 mL via INTRAVENOUS

## 2023-07-30 MED ORDER — BUTAMBEN-TETRACAINE-BENZOCAINE 2-2-14 % EX AERO
INHALATION_SPRAY | CUTANEOUS | Status: AC
  Filled 2023-07-30: qty 5

## 2023-07-30 MED ORDER — PHENYLEPHRINE 80 MCG/ML (10ML) SYRINGE FOR IV PUSH (FOR BLOOD PRESSURE SUPPORT)
PREFILLED_SYRINGE | INTRAVENOUS | Status: DC | PRN
  Administered 2023-07-30 (×2): 160 ug via INTRAVENOUS

## 2023-07-30 MED ORDER — LIDOCAINE HCL (CARDIAC) PF 100 MG/5ML IV SOSY
PREFILLED_SYRINGE | INTRAVENOUS | Status: DC | PRN
  Administered 2023-07-30: 100 mg via INTRAVENOUS

## 2023-07-30 MED ORDER — LACTATED RINGERS IV BOLUS
500.0000 mL | Freq: Once | INTRAVENOUS | Status: AC
  Administered 2023-07-30: 500 mL via INTRAVENOUS

## 2023-07-30 MED ORDER — EPHEDRINE 5 MG/ML INJ
INTRAVENOUS | Status: AC
  Filled 2023-07-30: qty 5

## 2023-07-30 MED ORDER — SODIUM CHLORIDE 0.9% FLUSH: 3.0000 mL | INTRAVENOUS | Status: DC | PRN

## 2023-07-30 MED ORDER — LIDOCAINE HCL (PF) 2 % IJ SOLN
INTRAMUSCULAR | Status: AC
  Filled 2023-07-30: qty 5

## 2023-07-30 MED ORDER — ALBUTEROL SULFATE HFA 108 (90 BASE) MCG/ACT IN AERS
INHALATION_SPRAY | RESPIRATORY_TRACT | Status: AC
  Filled 2023-07-30: qty 6.7

## 2023-07-30 MED ORDER — AMIODARONE HCL 200 MG PO TABS
200.0000 mg | ORAL_TABLET | Freq: Two times a day (BID) | ORAL | Status: DC
  Administered 2023-07-31 – 2023-08-02 (×5): 200 mg via ORAL
  Filled 2023-07-30 (×5): qty 1

## 2023-07-30 MED ORDER — FENTANYL CITRATE (PF) 100 MCG/2ML IJ SOLN
INTRAMUSCULAR | Status: AC
Start: 2023-07-30 — End: ?
  Filled 2023-07-30: qty 2

## 2023-07-30 MED ORDER — PHENYLEPHRINE 80 MCG/ML (10ML) SYRINGE FOR IV PUSH (FOR BLOOD PRESSURE SUPPORT)
PREFILLED_SYRINGE | INTRAVENOUS | Status: AC
  Filled 2023-07-30: qty 10

## 2023-07-30 NOTE — Interval H&P Note (Signed)
 History and Physical Interval Note:  07/30/2023 7:40 AM  Hector [name]  has presented today for surgery, with the diagnosis of Atrial fibrillation.  The various methods of treatment have been discussed with the patient and family. After consideration of risks, benefits and other options for treatment, the patient has consented to  Procedure(s): ECHOCARDIOGRAM, TRANSESOPHAGEAL (N/A) CARDIOVERSION (N/A) as a surgical intervention.  The patient's history has been reviewed, patient examined, no change in status, stable for surgery.  I have reviewed the patient's chart and labs.  Questions were answered to the patient's satisfaction.     Ayson Cherubini

## 2023-07-30 NOTE — Progress Notes (Signed)
*  PRELIMINARY RESULTS* Echocardiogram Echocardiogram Transesophageal has been performed.  Hector [name] 07/30/2023, 8:36 AM

## 2023-07-30 NOTE — Anesthesia Preprocedure Evaluation (Addendum)
 Anesthesia Evaluation  Patient identified by MRN, date of birth, ID band Patient awake    Reviewed: Allergy & Precautions, NPO status , Patient's Chart, lab work & pertinent test results  History of Anesthesia Complications Negative for: history of anesthetic complications  Airway Mallampati: III  TM Distance: >3 FB Neck ROM: full    Dental  (+) Poor Dentition, Missing   Pulmonary sleep apnea , COPD,  COPD inhaler, former smoker   Pulmonary exam normal        Cardiovascular hypertension, On Medications + Past MI and +CHF  + dysrhythmias Atrial Fibrillation      Neuro/Psych CVA  negative psych ROS   GI/Hepatic negative GI ROS, Neg liver ROS,,,  Endo/Other  diabetesHypothyroidism    Renal/GU Renal disease  negative genitourinary   Musculoskeletal   Abdominal   Peds  Hematology  (+) Blood dyscrasia, anemia   Anesthesia Other Findings Past Medical History: No date: Alcohol abuse 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 12/2007 MV: EF 28%, no isch;  b. 8/12 Echo: EF               25-35%; c. 02/2014 Echo: EF 20-25%; d. 12/2014 Cath:               minimal CAD; e. 01/2015 Echo: EF 50-55%;  d. 05/2016 Echo:              EF 30-35%, diff HK, gr2 DD; e. 11/2016 Echo: EF 45-50%,               diff HK; f. 09/2021 Echo: EF <20%; g. 11/2021 Echo:               EF<20%. No date: Chronic combined systolic (congestive) and diastolic  (congestive) heart failure (HCC)     Comment:  a. 05/2016 Echo: EF 30-35%; b. 11/2016 Echo: EF 45-50%,               diff HK; c. 09/2021 Echo: EF < 20%; d. 11/2021 Echo: EF               <20%, no rwma, Nl RV fxn, sev dil LA, mod-sev MR. mod dil              PA w/ mildly elev PASP. No date: CKD (chronic kidney disease), stage III (HCC) No date: Elevated troponin (chronic) No date: Essential hypertension 11/2013: GI bleed No date: Hyperlipidemia No date: Pancreatitis No date:  Paroxysmal A-fib (HCC)     Comment:  a. new onset s/p unsuccessful TEE/DCCV on 08/16/2014; b.               CHA2DS2VASc = 3-> eliquis  (freq noncompliant); c. 02/2021               s/p TEE/DCCV; d. Prev on amio->d/c 2/2 abnl TFTs. No date: Paroxysmal atrial flutter (HCC) No date: Sleep-disordered breathing     Comment:  Has yet to have a sleep study No date: Stroke [facility])  Past Surgical History: 01/09/2015: CARDIAC CATHETERIZATION; N/A     Comment:  Procedure: Left Heart Cath and Coronary Angiography;                Surgeon: Arleen Lacer, MD;  Location: [facility] INVASIVE CV              LAB;  Service: Cardiovascular;  Laterality: N/A; 03/05/2021: CARDIOVERSION; N/A     Comment:  Procedure: CARDIOVERSION;  Surgeon: Constancia Delton,  MD;  Location: [facility] ORS;  Service: Cardiovascular;                Laterality: N/A; 01/19/2023: CARDIOVERSION; N/A     Comment:  Procedure: CARDIOVERSION;  Surgeon: Darlis Eisenmenger,               MD;  Location: [facility] ORS;  Service: Cardiovascular;                Laterality: N/A; 04/28/2022: CENTRAL LINE INSERTION     Comment:  Procedure: CENTRAL LINE INSERTION;  Surgeon: Sammy Crisp, MD;  Location: [facility] INVASIVE CV LAB;                Service: Cardiovascular;; 07/22/2020: COLONOSCOPY; N/A     Comment:  Procedure: COLONOSCOPY;  Surgeon: Toledo, Alphonsus Jeans, MD;              Location: [facility] ENDOSCOPY;  Service: Gastroenterology;                Laterality: N/A; 08/16/2014: ELECTROPHYSIOLOGIC STUDY; N/A     Comment:  Procedure: CARDIOVERSION;  Surgeon: Devorah Fonder,               MD;  Location: [facility] ORS;  Service: Cardiovascular;                Laterality: N/A; 05/04/2022: ESOPHAGOGASTRODUODENOSCOPY (EGD) WITH PROPOFOL ; N/A     Comment:  Procedure: ESOPHAGOGASTRODUODENOSCOPY (EGD) WITH               PROPOFOL ;  Surgeon: Selena Daily, MD;  Location:               [facility] ENDOSCOPY;  Service: Gastroenterology;  Laterality:                N/A; 10/10/2015: FLEXIBLE SIGMOIDOSCOPY; N/A     Comment:  Procedure: FLEXIBLE SIGMOIDOSCOPY;  Surgeon: Deveron Fly, MD;  Location: [facility] ENDOSCOPY;  Service:               Endoscopy;  Laterality: N/A; No date: KNEE SURGERY; Right November 2011: NM MYOVIEW  LTD     Comment:  No ischemia or infarction. EF 50-55% ( no improvement               from 2009 Myoview  EF of 28% 04/28/2022: RIGHT HEART CATH; N/A     Comment:  Procedure: RIGHT HEART CATH;  Surgeon: Sammy Crisp,              MD;  Location: [facility] INVASIVE CV LAB;  Service:               Cardiovascular;  Laterality: N/A; 06/23/2023: RIGHT HEART CATH; N/A     Comment:  Procedure: RIGHT HEART CATH;  Surgeon: Darlis Eisenmenger,              MD;  Location: [facility] INVASIVE CV LAB;  Service:               Cardiovascular;  Laterality: N/A; 08/16/2014: TEE WITHOUT CARDIOVERSION; N/A     Comment:  Procedure: TRANSESOPHAGEAL ECHOCARDIOGRAM (TEE);                Surgeon: Devorah Fonder, MD;  Location: [facility] ORS;                Service: Cardiovascular;  Laterality:  N/A; 03/05/2021: TEE WITHOUT CARDIOVERSION; N/A     Comment:  Procedure: TRANSESOPHAGEAL ECHOCARDIOGRAM (TEE);                Surgeon: Constancia Delton, MD;  Location: [facility] ORS;                Service: Cardiovascular;  Laterality: N/A; 05/01/2022: TEE WITHOUT CARDIOVERSION; N/A     Comment:  Procedure: TRANSESOPHAGEAL ECHOCARDIOGRAM;  Surgeon:               Darlis Eisenmenger, MD;  Location: [facility] ORS;  Service:               Cardiovascular;  Laterality: N/A;  TEE with cardioversion 01/19/2023: TEE WITHOUT CARDIOVERSION; N/A     Comment:  Procedure: TRANSESOPHAGEAL ECHOCARDIOGRAM (TEE);                Surgeon: Darlis Eisenmenger, MD;  Location: [facility] ORS;                Service: Cardiovascular;  Laterality: N/A;  BMI    Body Mass Index: 39.65 kg/m      Reproductive/Obstetrics negative OB ROS                             Anesthesia  Physical Anesthesia Plan  ASA: 3  Anesthesia Plan: General   Post-op Pain Management: Minimal or no pain anticipated   Induction: Intravenous  PONV Risk Score and Plan: 1 and Propofol  infusion and TIVA  Airway Management Planned: Natural Airway and Nasal Cannula  Additional Equipment:   Intra-op Plan:   Post-operative Plan:   Informed Consent: I have reviewed the patients History and Physical, chart, labs and discussed the procedure including the risks, benefits and alternatives for the proposed anesthesia with the patient or authorized representative who has indicated his/her understanding and acceptance.     Dental Advisory Given  Plan Discussed with: Anesthesiologist, CRNA and Surgeon  Anesthesia Plan Comments: (Patient consented for risks of anesthesia including but not limited to:  - adverse reactions to medications - risk of airway placement if required - damage to eyes, teeth, lips or other oral mucosa - nerve damage due to positioning  - sore throat or hoarseness - Damage to heart, brain, nerves, lungs, other parts of body or loss of life  Patient voiced understanding and assent.)        Anesthesia Quick Evaluation

## 2023-07-30 NOTE — Progress Notes (Signed)
 PROGRESS NOTE    Hector [name]  ZOX:[id number] DOB: Sep 23, 1965 DOA: 07/28/2023 PCP: Dionicia Frater, MD  255A/255A-AA  LOS: 2 days   Brief hospital course:   Assessment & Plan: Hector [name] is a 58 y.o. male with medical history significant of HFrEF, atrial fibrillation on Eliquis , type 2 diabetes, OSA not compliant with CPAP, CKD stage IV, alcohol use disorder in remission, nonobstructive CAD, who presents to the ED due to ground-level fall.   Hector [name] states that approximately 5 days ago, he began to come down with a cold, endorsing productive cough and shortness of breath.  He endorses poor appetite, headache that began at the same time.  Due to poor appetite, he did not take any of his medicines until 2 days ago.  He notes that he restarted on because he was having palpitations and felt as though he was putting weight on.  Then today, he felt weak all over.   On EMS arrival, patient was noted to be in atrial fibrillation with RVR with low blood pressure.    * Syncope Patient is presenting with a syncopal episode with loss of consciousness leading to a right frontal scalp laceration.  Unclear etiology, suspect my be related to AFL but also at high-risk for VT.  Has not been ICD candidate due to poor compliance.  Refuses LifeVest. --Zio at discharge --cont tele  Acute on chronic HFrEF (heart failure with reduced ejection fraction) (HCC) Long history of HFrEF with EF < 20% in the setting of non-ischemic cardiomyopathy. He does not appear profoundly hypervolemic, however BNP is elevated and there is concern for pulmonary edema on CXR. He notes he did not take Torsemide  for several days.  but looks well-compensated on exam, per cardio. A-flutter may be contributing --Cr went up with diuresis --hold diuretic - GDMT limited by CKD IV. Restart as able   Atrial flutter with RVR (HCC) Medication non-compliance - TEE/DC-CV 3/24  - Last DC-CV 11/24 - Previously seen by  EP -> not candidate for ablation  - back in AFL with RVR  --cont amio gtt for reloading - tee cardioversion today  COPD with acute exacerbation (HCC) Coronavirus OC43 URI Patient reports increased cough and shortness of breath, in addition to poor p.o. intake, body aches and headache that began 5 days ago.  RVP pos for Coronavirus OC43.  Started on prednisone  and DuoNeb. Doing well, no hypoxia or wheeze --cont prednisone  and DuoNeb  Type 2 diabetes mellitus (HCC) --A1c 7.3 --d/c BG checks and SSI  Hypothyroidism --cont Synthroid   Chronic renal impairment, stage 4 (severe) (HCC) History of CKD Stage IV with creatinine ranging from 3.1 up to 4, currently stable  Alcohol use, unspecified, in remission - Daily folic acid , thiamine  and multivitamin  OSA (obstructive sleep apnea) Patient reports noncompliance with CPAP.   DVT prophylaxis: On:Eliquis  Code Status: Full code  Family Communication: none at bedside Level of care: Telemetry Cardiac Dispo:   The patient is from: home Anticipated d/c is to: home Anticipated d/c date is: 1 day   Subjective and Interval History:  Pt reported breathing ok.  Tolerated procedure. Hungry. No dyspnea   Objective: Vitals:   07/30/23 0930 07/30/23 0940 07/30/23 1116 07/30/23 1506  BP: 97/81  106/88 (!) 112/95  Pulse: (!) 59  65 65  Resp: (!) 21     Temp:  97.7 F (36.5 C) 97.7 F (36.5 C) 98.3 F (36.8 C)  TempSrc:  Oral    SpO2: 94%  95%  92%  Weight:      Height:        Intake/Output Summary (Last 24 hours) at 07/30/2023 1549 Last data filed at 07/30/2023 9562 Gross per 24 hour  Intake 105.98 ml  Output 400 ml  Net -294.02 ml   Filed Weights   07/28/23 0936 07/29/23 0600 07/30/23 0516  Weight: 136.1 kg 126.7 kg 129 kg    Examination:   Constitutional: NAD, AAOx3 HEENT: conjunctivae and lids normal, EOMI CV: No cyanosis.   RESP: normal respiratory effort, on RA, no wheeze Extremities: mild edema in BLE SKIN: warm,  dry Neuro: moving all 4, non-focal   Data Reviewed: I have personally reviewed labs and imaging studies    Raymonde Calico, MD [facility] If 7PM-7AM, please contact night-coverage 07/30/2023, 3:49 PM

## 2023-07-30 NOTE — CV Procedure (Signed)
   TRANSESOPHAGEAL ECHOCARDIOGRAM GUIDED DIRECT CURRENT CARDIOVERSION  NAME:  Hector [name]   MRN: [id number] DOB:  Feb 18, 1966   ADMIT DATE: 07/28/2023  INDICATIONS:  Atrial flutter  PROCEDURE:   Informed consent was obtained prior to the procedure. The risks, benefits and alternatives for the procedure were discussed and the patient comprehended these risks.  Risks include, but are not limited to, cough, sore throat, vomiting, nausea, somnolence, esophageal and stomach trauma or perforation, bleeding, low blood pressure, aspiration, pneumonia, infection, trauma to the teeth and death.    After a procedural time-out, the oropharynx was anesthetized and the patient was sedated by the anesthesia service. The transesophageal probe was inserted in the esophagus and stomach without difficulty and multiple views were obtained.   FINDINGS:  LEFT VENTRICLE: EF = < 20% global HK  RIGHT VENTRICLE: Severe HK   LEFT ATRIUM: Markedly dilated  LEFT ATRIAL APPENDAGE: No clot  RIGHT ATRIUM: Normal  AORTIC VALVE:  [place]. No AS  MITRAL VALVE:    Mild MR  TRICUSPID VALVE: Mild TR  PULMONIC VALVE: Grossly normal   INTERATRIAL SEPTUM: Not visualized  PERICARDIUM: No effusion  DESCENDING AORTA: No visualized   CARDIOVERSION:     Indications:  Atrial Flutter  Procedure Details:  Once the TEE was complete, the patient received a single biphasic, synchronized 200J shock with prompt conversion to sinus rhythm.   TEE aborted prior to completion of full study due to patient developing respiratory distress and apnea with sats in the 60s. Oral airway placed by CRNA. And underwent bag-mask ventilation with jaw thrust to open airway. Patient also had nasal trumpet placed. After about 20 minutes patient awakened and oral/nasal airway removed and respiratory and hemodynamic status were stable.  Jules Oar, MD  9:30 AM

## 2023-07-30 NOTE — Progress Notes (Addendum)
 Advanced Heart Failure Rounding Note   Subjective:    Feeling better today. Diuretics have been held.  Remains in AFL with CVR on amio gtt  VQ + for possible RLL PE/ Resp panel + for coronavirus OC43 (common cold pathogen)  BMET pending    Objective:   Weight Range:  Vital Signs:   Temp:  [97.7 F (36.5 C)-98 F (36.7 C)] 98 F (36.7 C) (06/06 0400) Pulse Rate:  [58-97] 80 (06/06 0400) Resp:  [14-20] 19 (06/06 0400) BP: (107-124)/(84-99) 123/96 (06/06 0400) SpO2:  [93 %-98 %] 95 % (06/06 0400) Weight:  [129 kg] 129 kg (06/06 0516)    Weight change: Filed Weights   07/28/23 0936 07/29/23 0600 07/30/23 0516  Weight: 136.1 kg 126.7 kg 129 kg    Intake/Output:   Intake/Output Summary (Last 24 hours) at 07/30/2023 0724 Last data filed at 07/30/2023 1610 Gross per 24 hour  Intake 584.11 ml  Output 650 ml  Net -65.89 ml     Physical Exam: General:  Sitting up on side of bed No resp difficulty HEENT: normal Neck: supple.no JVD . Carotids 2+ bilat; no bruits. No lymphadenopathy or thryomegaly appreciated. Cor: PMI nondisplaced. Irregular rate & rhythm. No rubs, gallops or murmurs. Lungs: clear Abdomen: obese soft, nontender, nondistended. No hepatosplenomegaly. No bruits or masses. Good bowel sounds. Extremities: no cyanosis, clubbing, rash, edema Neuro: alert & orientedx3, cranial nerves grossly intact. moves all 4 extremities w/o difficulty. Affect pleasant   Telemetry: AFL 70-80s Personally reviewed  Labs: Basic Metabolic Panel: Recent Labs  Lab 07/28/23 0940 07/29/23 0409 07/30/23 0625  NA 136 134* 135  K 4.0 4.2 4.3  CL 106 104 105  CO2 22 21* 21*  GLUCOSE 127* 203* 141*  BUN 44* 52* 70*  CREATININE 3.55* 3.93* 4.41*  CALCIUM  8.2* 8.3* 8.4*  MG  --  2.0 2.3    Liver Function Tests: No results for input(s): "AST", "ALT", "ALKPHOS", "BILITOT", "PROT", "ALBUMIN" in the last 168 hours. No results for input(s): "LIPASE", "AMYLASE" in the last  168 hours. No results for input(s): "AMMONIA" in the last 168 hours.  CBC: Recent Labs  Lab 07/28/23 0940 07/29/23 0409 07/30/23 0625  WBC 8.5 6.6 9.8  NEUTROABS  --  5.5  --   HGB 12.9* 13.1 13.3  HCT 41.4 40.4 41.4  MCV 84.3 83.8 83.6  PLT 221 230 251    Cardiac Enzymes: No results for input(s): "CKTOTAL", "CKMB", "CKMBINDEX", "TROPONINI" in the last 168 hours.  BNP: BNP (last 3 results) Recent Labs    06/21/23 0250 07/13/23 1549 07/28/23 0940  BNP 756.8* 531.8* 826.8*    ProBNP (last 3 results) No results for input(s): "PROBNP" in the last 8760 hours.    Other results:  Imaging: ECHOCARDIOGRAM COMPLETE Result Date: 07/29/2023    ECHOCARDIOGRAM REPORT   Patient Name:   Hector [name] Date of Exam: 07/29/2023 Medical Rec #:  [id number]           Height:       71.0 in Accession #:    [id number]          Weight:       279.4 lb Date of Birth:  1965/04/22           BSA:          2.430 m Patient Age:    57 years            BP:  107/86 mmHg Patient Gender: M                   HR:           97 bpm. Exam Location:  [facility] Procedure: 2D Echo, Cardiac Doppler and Color Doppler (Both Spectral and Color            Flow Doppler were utilized during procedure). Indications:     Pulmonary embolus I26.09                  Syncope R55  History:         Patient has prior history of Echocardiogram examinations, most                  recent 01/19/2023. Risk Factors:Hypertension. Alcohol abuse,                  Paroxysmal Afib.  Sonographer:     Broadus Canes Referring Phys:  [id number] Avi Body Diagnosing Phys: Antionette Kirks MD IMPRESSIONS  1. Left ventricular ejection fraction, by estimation, is 20 to 25%. The left ventricle has severely decreased function. The left ventricle demonstrates global hypokinesis. There is moderate left ventricular hypertrophy. Left ventricular diastolic parameters are indeterminate.  2. Right ventricular systolic function is normal. The right ventricular size  is normal. Tricuspid regurgitation signal is inadequate for assessing PA pressure.  3. Left atrial size was moderately dilated.  4. The mitral valve is normal in structure. Mild mitral valve regurgitation. No evidence of mitral stenosis.  5. The aortic valve is normal in structure. Aortic valve regurgitation is not visualized. No aortic stenosis is present. FINDINGS  Left Ventricle: Left ventricular ejection fraction, by estimation, is 20 to 25%. The left ventricle has severely decreased function. The left ventricle demonstrates global hypokinesis. The left ventricular internal cavity size was normal in size. There is moderate left ventricular hypertrophy. Left ventricular diastolic parameters are indeterminate. Right Ventricle: The right ventricular size is normal. No increase in right ventricular wall thickness. Right ventricular systolic function is normal. Tricuspid regurgitation signal is inadequate for assessing PA pressure. Left Atrium: Left atrial size was moderately dilated. Right Atrium: Right atrial size was normal in size. Pericardium: There is no evidence of pericardial effusion. Mitral Valve: The mitral valve is normal in structure. Mild mitral valve regurgitation. No evidence of mitral valve stenosis. Tricuspid Valve: The tricuspid valve is normal in structure. Tricuspid valve regurgitation is not demonstrated. No evidence of tricuspid stenosis. Aortic Valve: The aortic valve is normal in structure. Aortic valve regurgitation is not visualized. No aortic stenosis is present. Aortic valve mean gradient measures 2.0 mmHg. Aortic valve peak gradient measures 3.1 mmHg. Aortic valve area, by VTI measures 4.83 cm. Pulmonic Valve: The pulmonic valve was normal in structure. Pulmonic valve regurgitation is trivial. No evidence of pulmonic stenosis. Aorta: The aortic root is normal in size and structure. Venous: The inferior vena cava was not well visualized. IAS/Shunts: No atrial level shunt detected by  color flow Doppler.  LEFT VENTRICLE PLAX 2D LVIDd:         5.20 cm LVIDs:         4.70 cm LV PW:         1.40 cm LV IVS:        1.70 cm LVOT diam:     2.30 cm LV SV:         51 LV SV Index:   21 LVOT Area:     4.15 cm  RIGHT VENTRICLE RV Basal diam:  4.30 cm RV Mid diam:    3.80 cm RV S prime:     8.59 cm/s TAPSE (M-mode): 1.5 cm LEFT ATRIUM           Index        RIGHT ATRIUM           Index LA diam:      5.10 cm 2.10 cm/m   RA Area:     18.60 cm LA Vol (A2C): 78.0 ml 32.09 ml/m  RA Volume:   51.10 ml  21.02 ml/m LA Vol (A4C): 63.7 ml 26.21 ml/m  AORTIC VALVE AV Area (Vmax):    3.57 cm AV Area (Vmean):   2.75 cm AV Area (VTI):     4.83 cm AV Vmax:           88.20 cm/s AV Vmean:          64.000 cm/s AV VTI:            0.105 m AV Peak Grad:      3.1 mmHg AV Mean Grad:      2.0 mmHg LVOT Vmax:         75.80 cm/s LVOT Vmean:        42.400 cm/s LVOT VTI:          0.122 m LVOT/AV VTI ratio: 1.16  AORTA Ao Root diam: 3.40 cm MITRAL VALVE               TRICUSPID VALVE MV Area (PHT): 3.13 cm    TR Peak grad:   16.2 mmHg MV Decel Time: 242 msec    TR Vmax:        201.00 cm/s MV E velocity: 96.40 cm/s                            SHUNTS                            Systemic VTI:  0.12 m                            Systemic Diam: 2.30 cm Antionette Kirks MD Electronically signed by Antionette Kirks MD Signature Date/Time: 07/29/2023/3:37:12 PM    Final    NM Pulmonary Perfusion Result Date: 07/28/2023 CLINICAL DATA:  [facility]-NMDeep venous thrombosis (DVT) EXAM: NUCLEAR MEDICINE PERFUSION LUNG SCAN TECHNIQUE: Perfusion images were obtained in multiple projections after intravenous injection of radiopharmaceutical. RADIOPHARMACEUTICALS:  4.3 mCi Tc-61m MAA COMPARISON:  Chest radiograph 07/28/2023 FINDINGS: Single wedge-shaped peripheral perfusion defect within the posterior lateral RIGHT lower lobe seen only on posterior projection. No additional defects identified. IMPRESSION: Concern for RIGHT lower lobe pulmonary embolism.  Electronically Signed   By: Deboraha Fallow M.D.   On: 07/28/2023 16:31   CT Head Wo Contrast Result Date: 07/28/2023 CLINICAL DATA:  Fall, head injury EXAM: CT HEAD WITHOUT CONTRAST CT CERVICAL SPINE WITHOUT CONTRAST TECHNIQUE: Multidetector CT imaging of the head and cervical spine was performed following the standard protocol without intravenous contrast. Multiplanar CT image reconstructions of the cervical spine were also generated. RADIATION DOSE REDUCTION: This exam was performed according to the departmental dose-optimization program which includes automated exposure control, adjustment of the mA and/or kV according to patient size and/or use of iterative reconstruction technique. COMPARISON:  None Available. FINDINGS: CT HEAD FINDINGS Brain: No evidence of  acute infarction, hemorrhage, hydrocephalus, extra-axial collection or mass lesion/mass effect. Periventricular and deep white matter hypodensity. Vascular: No hyperdense vessel or unexpected calcification. Skull: Normal. Negative for fracture or focal lesion. Sinuses/Orbits: No acute finding. Other: Soft tissue laceration of the right frontal scalp (series 2, image 21). CT CERVICAL SPINE FINDINGS Alignment: Positional straightening and reversal of the normal cervical lordosis. Skull base and vertebrae: No acute fracture. No primary bone lesion or focal pathologic process. Soft tissues and spinal canal: No prevertebral fluid or swelling. No visible canal hematoma. Disc levels:  Intact. Upper chest: Negative. Other: None. IMPRESSION: 1. No acute intracranial pathology. 2. Extensive periventricular and deep white matter hypodensity, which may reflect age advanced small-vessel white matter disease or demyelinating disorder. 3. Soft tissue laceration of the right frontal scalp. 4. No fracture or static subluxation of the cervical spine. Electronically Signed   By: Fredricka Jenny M.D.   On: 07/28/2023 11:19   CT Cervical Spine Wo Contrast Result Date:  07/28/2023 CLINICAL DATA:  Fall, head injury EXAM: CT HEAD WITHOUT CONTRAST CT CERVICAL SPINE WITHOUT CONTRAST TECHNIQUE: Multidetector CT imaging of the head and cervical spine was performed following the standard protocol without intravenous contrast. Multiplanar CT image reconstructions of the cervical spine were also generated. RADIATION DOSE REDUCTION: This exam was performed according to the departmental dose-optimization program which includes automated exposure control, adjustment of the mA and/or kV according to patient size and/or use of iterative reconstruction technique. COMPARISON:  None Available. FINDINGS: CT HEAD FINDINGS Brain: No evidence of acute infarction, hemorrhage, hydrocephalus, extra-axial collection or mass lesion/mass effect. Periventricular and deep white matter hypodensity. Vascular: No hyperdense vessel or unexpected calcification. Skull: Normal. Negative for fracture or focal lesion. Sinuses/Orbits: No acute finding. Other: Soft tissue laceration of the right frontal scalp (series 2, image 21). CT CERVICAL SPINE FINDINGS Alignment: Positional straightening and reversal of the normal cervical lordosis. Skull base and vertebrae: No acute fracture. No primary bone lesion or focal pathologic process. Soft tissues and spinal canal: No prevertebral fluid or swelling. No visible canal hematoma. Disc levels:  Intact. Upper chest: Negative. Other: None. IMPRESSION: 1. No acute intracranial pathology. 2. Extensive periventricular and deep white matter hypodensity, which may reflect age advanced small-vessel white matter disease or demyelinating disorder. 3. Soft tissue laceration of the right frontal scalp. 4. No fracture or static subluxation of the cervical spine. Electronically Signed   By: Fredricka Jenny M.D.   On: 07/28/2023 11:19   DG Chest Port 1 View Result Date: 07/28/2023 CLINICAL DATA:  Shortness of breath EXAM: PORTABLE CHEST 1 VIEW COMPARISON:  June 21, 2023 FINDINGS: Bilateral  reticular interstitial infiltrates correlate with congestive changes without consolidations. No pleural effusions Heart and mediastinum in the upper limits of normal with a left ventricular configuration IMPRESSION: Bilateral reticular interstitial infiltrates correlate with congestive changes without consolidations. Electronically Signed   By: Fredrich Jefferson M.D.   On: 07/28/2023 10:05     Medications:     Scheduled Medications:  acetaminophen   500 mg Oral Once   allopurinol   100 mg Oral Daily   apixaban   5 mg Oral BID   atorvastatin   80 mg Oral Daily   ferrous sulfate   325 mg Oral Q breakfast   ipratropium-albuterol   3 mL Nebulization BID   levothyroxine   125 mcg Oral Q0600   predniSONE   40 mg Oral Q breakfast   sodium chloride  flush  3 mL Intravenous Q12H   sodium chloride  flush  3-10 mL Intravenous Q12H  Infusions:  sodium chloride      amiodarone  30 mg/hr (07/30/23 0614)    PRN Medications: sodium chloride , acetaminophen , ondansetron  (ZOFRAN ) IV, sodium chloride  flush, sodium chloride  flush, traMADol    Assessment/Plan:    1. Syncopal episode x 2 - Unclear etiology suspect my be related to AFL in setting of URI but also at high-risk for VT. VQ + for PE - no events on tele - continue IV amio - Echo 07/29/23 EF 20-25% RV ok  - no driving x 6 months - has not been ICD candidate due to poor compliance - Refuses LifeVest - Will place Zio at d/c   2. 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 Not interested in Palliative Care referral - BNP and CXR suggestive of volume  overload but looks well-compensated on exam and suspect symptoms related to URI - Lactic acid  normal - Scr was up with diuresis. - BMET today pending. Continue to hold diuretics - GDMT limited by CKD IV. Restart as able  3. URI - RP + for coronavirus OC43 (COVID-19 negative) - symptoms resolved   4. Paroxysmal atrial fibrillation/atrial flutter- - TEE/DC-CV 3/24  - Last DC-CV 11/24 - back in AFL with RVR  - reloading amio.  - plan TEE/DC-CV today. I d/w him and he is ok with proceeding (Has missed several doses of apixaban  so will need TEE) - continue apixaban  5 mg twice daily for CHA2DS2-VASc score of at least 6 - Previously seen by EP -> not candidate for ablation   5. CKD stage IV- - Baseline Scr 2.8-3.4 - SCr 3.55 -> 3.9 today -> pending. - Hold lasix    6. Nonobstructive coronary artery disease- - Cath 2016 - saw cardiology (Dunn) 04/24 - continue atorvastatin  80mg  daily - LDL 01/09/22 was 88   7. Obstructive sleep apnea- - does not wear CPAP and says that he hasn't worn it in "forever" and not interested in trying to wear it at this time - last sleep study done 12/2015   8. Type 2 diabetes- -A1c 7.7% on 01/17/23 -Continue SGLT2i -Per PCP  9. Positive VQ - possible RLL PE - continue apixaban  - I think this is equivocal. RV ok on echo - Will not increase Eliquis  at this time   Possible d/c today after TEE/DC-CV if hemodynamically stable and SCr stable  Length of Stay: 2  Addendum: Scr up to 4.4. Appears volume depleted in setting of recent URI. Does not appear low output. Will give 500cc IVF LR after DC-CV   Jules Oar MD 07/30/2023, 7:24 AM  Advanced Heart Failure Team Pager [phone] (M-F; 7a - 4p)  Please contact [facility] for night-coverage after hours (4p -7a ) and weekends on [email or web address]

## 2023-07-30 NOTE — Transfer of Care (Signed)
 Immediate Anesthesia Transfer of Care Note  Patient: Hector [name]  Procedure(s) Performed: ECHOCARDIOGRAM, TRANSESOPHAGEAL CARDIOVERSION  Patient Location: PACU  Anesthesia Type:MAC  Level of Consciousness: awake and alert   Airway & Oxygen Therapy: Patient Spontanous Breathing and Patient connected to nasal cannula oxygen  Post-op Assessment: Report given to RN and Post -op Vital signs reviewed and stable  Post vital signs: stable  Last Vitals:  Vitals Value Taken Time  BP 97/77   Temp    Pulse 60   Resp 16   SpO2 94     Last Pain:  Vitals:   07/30/23 0800  TempSrc:   PainSc: 0-No pain      Patients Stated Pain Goal: 2 (07/29/23 0725)  Complications: No notable events documented.

## 2023-07-31 ENCOUNTER — Inpatient Hospital Stay

## 2023-07-31 DIAGNOSIS — I2583 Coronary atherosclerosis due to lipid rich plaque: Secondary | ICD-10-CM

## 2023-07-31 DIAGNOSIS — J9601 Acute respiratory failure with hypoxia: Secondary | ICD-10-CM

## 2023-07-31 DIAGNOSIS — I2699 Other pulmonary embolism without acute cor pulmonale: Secondary | ICD-10-CM

## 2023-07-31 DIAGNOSIS — I2609 Other pulmonary embolism with acute cor pulmonale: Secondary | ICD-10-CM

## 2023-07-31 DIAGNOSIS — E7800 Pure hypercholesterolemia, unspecified: Secondary | ICD-10-CM

## 2023-07-31 DIAGNOSIS — R55 Syncope and collapse: Secondary | ICD-10-CM | POA: Diagnosis not present

## 2023-07-31 DIAGNOSIS — I2510 Atherosclerotic heart disease of native coronary artery without angina pectoris: Secondary | ICD-10-CM

## 2023-07-31 DIAGNOSIS — I5082 Biventricular heart failure: Secondary | ICD-10-CM

## 2023-07-31 LAB — BASIC METABOLIC PANEL WITH GFR
Anion gap: 11 (ref 5–15)
BUN: 77 mg/dL — ABNORMAL HIGH (ref 6–20)
CO2: 20 mmol/L — ABNORMAL LOW (ref 22–32)
Calcium: 8.3 mg/dL — ABNORMAL LOW (ref 8.9–10.3)
Chloride: 101 mmol/L (ref 98–111)
Creatinine, Ser: 4.87 mg/dL — ABNORMAL HIGH (ref 0.61–1.24)
GFR, Estimated: 13 mL/min — ABNORMAL LOW (ref >60)
Glucose, Bld: 164 mg/dL — ABNORMAL HIGH (ref 70–99)
Potassium: 4.5 mmol/L (ref 3.5–5.1)
Sodium: 132 mmol/L — ABNORMAL LOW (ref 135–145)

## 2023-07-31 LAB — MAGNESIUM: Magnesium: 2.4 mg/dL (ref 1.7–2.4)

## 2023-07-31 MED ORDER — ISOSORB DINITRATE-HYDRALAZINE 20-37.5 MG PO TABS
1.0000 | ORAL_TABLET | Freq: Three times a day (TID) | ORAL | Status: DC
  Administered 2023-07-31 – 2023-08-02 (×7): 1 via ORAL
  Filled 2023-07-31 (×8): qty 1

## 2023-07-31 MED ORDER — APIXABAN 5 MG PO TABS
5.0000 mg | ORAL_TABLET | Freq: Once | ORAL | Status: AC
  Administered 2023-07-31: 5 mg via ORAL
  Filled 2023-07-31: qty 1

## 2023-07-31 MED ORDER — APIXABAN 5 MG PO TABS: 5.0000 mg | ORAL_TABLET | Freq: Two times a day (BID) | ORAL | Status: DC

## 2023-07-31 MED ORDER — ORAL CARE MOUTH RINSE: 15.0000 mL | OROMUCOSAL | Status: DC | PRN

## 2023-07-31 MED ORDER — APIXABAN 5 MG PO TABS
10.0000 mg | ORAL_TABLET | Freq: Two times a day (BID) | ORAL | Status: DC
  Administered 2023-07-31 – 2023-08-02 (×4): 10 mg via ORAL
  Filled 2023-07-31 (×4): qty 2

## 2023-07-31 NOTE — Progress Notes (Addendum)
 Pt refusing blood sugar checks. Pt states he is not diabetic. Dr Sari Cunning notified via secure chat.  MD aware.

## 2023-07-31 NOTE — Progress Notes (Signed)
 PROGRESS NOTE    Hector [name]  WGN:[id number] DOB: May 22, 1965 DOA: 07/28/2023 PCP: Dionicia Frater, MD  [id number]/[id number]-[id number]  LOS: 3 days   Brief hospital course:   Assessment & Plan: Hector [name] is a 58 y.o. male with medical history significant of HFrEF, atrial fibrillation on Eliquis , type 2 diabetes, OSA not compliant with CPAP, CKD stage IV, alcohol use disorder in remission, nonobstructive CAD, who presents to the ED due to ground-level fall.   Hector [name] states that approximately 5 days ago, he began to come down with a cold, endorsing productive cough and shortness of breath.  He endorses poor appetite, headache that began at the same time.  Due to poor appetite, he did not take any of his medicines until 2 days ago.  He notes that he restarted on because he was having palpitations and felt as though he was putting weight on.  Then today, he felt weak all over.   On EMS arrival, patient was noted to be in atrial fibrillation with RVR with low blood pressure.    Syncope Patient is presenting with a syncopal episode with loss of consciousness leading to a right frontal scalp laceration.  Unclear etiology, suspect my be related to AFL but also at high-risk for VT. Also may have PE. Has not been ICD candidate due to poor compliance.  Refuses LifeVest. --Zio at discharge --cont tele  PE? VQ scan suggestive of this - apixaban  loading dose  Acute on chronic HFrEF (heart failure with reduced ejection fraction) (HCC) Long history of HFrEF with EF < 20% in the setting of non-ischemic cardiomyopathy. He does not appear profoundly hypervolemic, however BNP is elevated and there is concern for pulmonary edema on CXR. He notes he did not take Torsemide  for several days.  but looks well-compensated on exam, per cardio. A-flutter may be contributing --Cr went up with diuresis --holding diuretic - GDMT limited by kidney function - may need inotropic support, central  line  Atrial flutter with RVR (HCC) Medication non-compliance Cardioverted here 6/6 - Previously seen by EP -> not candidate for ablation  --cont amio   COPD with acute exacerbation (HCC) Coronavirus OC43 URI Patient reports increased cough and shortness of breath, in addition to poor p.o. intake, body aches and headache that began 5 days ago.  RVP pos for Coronavirus OC43.  Started on prednisone  and DuoNeb. Doing well, no hypoxia or wheeze --cont prednisone  and DuoNeb, tomorrow is day 5 and last day of steroids  Type 2 diabetes mellitus (HCC) --A1c 7.3 --refuses SSI and fingersticks  Hypothyroidism --cont Synthroid   AKI on Chronic renal impairment, stage 4 (severe) (HCC) History of CKD Stage IV with creatinine ranging from 3.1 up to 4, currently up-trending, k wnl and no sig acidosis, will check renal u/s to r/o obstruction, may have developing cardiorenal, may need nephrology intervention  Alcohol use, unspecified, in remission - Daily folic acid , thiamine  and multivitamin  OSA (obstructive sleep apnea) Patient reports noncompliance with CPAP.  Goals of care Lengthy discussion today at bedside about code status, shared that should cardiopulmonary arrest occur, chances of success are not great, and odds of meaningful recovery are low given baseline severe chf and ckd. Patient elects DNR/DNI.   DVT prophylaxis: On:Eliquis  Code Status: dnr/dni Family Communication: none at bedside Level of care: Telemetry Cardiac Dispo:   The patient is from: home Anticipated d/c is to: home Anticipated d/c date is: 1 day   Subjective and Interval History:  Pt reported breathing is  fine, urinating fine, no bm today, tolerating diet, no pain   Objective: Vitals:   07/31/23 0519 07/31/23 0832 07/31/23 0835 07/31/23 1103  BP:   (!) 130/99 (!) 128/98  Pulse:   63 62  Resp:    20  Temp:   98 F (36.7 C) 98.8 F (37.1 C)  TempSrc:      SpO2:  98% 96% 90%  Weight: 130.3 kg      Height:        Intake/Output Summary (Last 24 hours) at 07/31/2023 1535 Last data filed at 07/31/2023 0521 Gross per 24 hour  Intake 3 ml  Output 700 ml  Net -697 ml   Filed Weights   07/29/23 0600 07/30/23 0516 07/31/23 0519  Weight: 126.7 kg 129 kg 130.3 kg    Examination:   Constitutional: NAD, AAOx3 HEENT: conjunctivae and lids normal, EOMI CV: No cyanosis.   RESP: normal respiratory effort, on RA, no wheeze Extremities: mild edema in BLE SKIN: warm, dry Neuro: moving all 4, non-focal   Data Reviewed: I have personally reviewed labs and imaging studies    Hector Calico, MD [facility] If 7PM-7AM, please contact night-coverage 07/31/2023, 3:35 PM

## 2023-07-31 NOTE — Progress Notes (Signed)
 Cardiology Progress Note   Patient Name: Hector [name] Date of Encounter: 07/31/2023  Primary Cardiologist: Belva Boyden, MD  Subjective   Maintaining sinus rhythm.  Denies c/p or dyspnea.  Has a headache this AM.  Feels like abdomen is a little tight.  Has been off of diuretics since 6/4. Objective   Inpatient Medications    Scheduled Meds:  acetaminophen   500 mg Oral Once   allopurinol   100 mg Oral Daily   amiodarone   200 mg Oral BID   apixaban   5 mg Oral BID   atorvastatin   80 mg Oral Daily   ferrous sulfate   325 mg Oral Q breakfast   ipratropium-albuterol   3 mL Nebulization BID   levothyroxine   125 mcg Oral Q0600   predniSONE   40 mg Oral Q breakfast   sodium chloride  flush  3 mL Intravenous Q12H   Continuous Infusions:  amiodarone  30 mg/hr (07/31/23 0632)   PRN Meds: acetaminophen , ondansetron  (ZOFRAN ) IV, sodium chloride  flush, traMADol    Vital Signs    Vitals:   07/31/23 0357 07/31/23 0519 07/31/23 0832 07/31/23 0835  BP: (!) 134/102   (!) 130/99  Pulse: 65   63  Resp: 19     Temp: 98.1 F (36.7 C)   98 F (36.7 C)  TempSrc:      SpO2: 92%  98% 96%  Weight:  130.3 kg    Height:        Intake/Output Summary (Last 24 hours) at 07/31/2023 0943 Last data filed at 07/31/2023 0521 Gross per 24 hour  Intake 3 ml  Output 700 ml  Net -697 ml   Filed Weights   07/29/23 0600 07/30/23 0516 07/31/23 0519  Weight: 126.7 kg 129 kg 130.3 kg    Physical Exam   GEN: Well nourished, well developed, in no acute distress.  HEENT: Grossly normal.  Neck: Supple, no JVD, carotid bruits, or masses. Cardiac: RRR, distant, no murmurs, rubs, or gallops. No clubbing, cyanosis, edema.  Radials 2+, DP/PT 2+ and equal bilaterally.  Respiratory:  Respirations regular and unlabored, clear to auscultation bilaterally. GI: Soft, nontender, nondistended, BS + x 4. MS: no deformity or atrophy. Skin: warm and dry, no rash. Neuro:  Strength and sensation are intact. Psych:  AAOx3.  Normal affect.  Labs    Chemistry Recent Labs  Lab 07/29/23 0409 07/30/23 0625 07/31/23 0552  NA 134* 135 132*  K 4.2 4.3 4.5  CL 104 105 101  CO2 21* 21* 20*  GLUCOSE 203* 141* 164*  BUN 52* 70* 77*  CREATININE 3.93* 4.41* 4.87*  CALCIUM  8.3* 8.4* 8.3*  GFRNONAA 17* 15* 13*  ANIONGAP 9 9 11      Hematology Recent Labs  Lab 07/28/23 0940 07/29/23 0409 07/30/23 0625  WBC 8.5 6.6 9.8  RBC 4.91 4.82 4.95  HGB 12.9* 13.1 13.3  HCT 41.4 40.4 41.4  MCV 84.3 83.8 83.6  MCH 26.3 27.2 26.9  MCHC 31.2 32.4 32.1  RDW 17.6* 18.0* 18.1*  PLT 221 230 251    Cardiac Enzymes  Recent Labs  Lab 07/28/23 0940 07/28/23 2047  TROPONINIHS 74* 110*      BNP    Component Value Date/Time   BNP 826.8 (H) 07/28/2023 0940   Lipids  Lab Results  Component Value Date   CHOL 141 01/09/2022   HDL 25 (L) 01/09/2022   LDLCALC 88 01/09/2022   TRIG 140 01/09/2022   CHOLHDL 5.6 01/09/2022    HbA1c  Lab Results  Component Value  Date   HGBA1C 7.3 (H) 06/28/2023    Radiology    NM Pulmonary Perfusion Result Date: 07/28/2023 CLINICAL DATA:  [facility]-NMDeep venous thrombosis (DVT) EXAM: NUCLEAR MEDICINE PERFUSION LUNG SCAN TECHNIQUE: Perfusion images were obtained in multiple projections after intravenous injection of radiopharmaceutical. RADIOPHARMACEUTICALS:  4.3 mCi Tc-16m MAA COMPARISON:  Chest radiograph 07/28/2023 FINDINGS: Single wedge-shaped peripheral perfusion defect within the posterior lateral RIGHT lower lobe seen only on posterior projection. No additional defects identified. IMPRESSION: Concern for RIGHT lower lobe pulmonary embolism. Electronically Signed   By: Deboraha Fallow M.D.   On: 07/28/2023 16:31   CT Head Wo Contrast Result Date: 07/28/2023 CLINICAL DATA:  Fall, head injury EXAM: CT HEAD WITHOUT CONTRAST CT CERVICAL SPINE WITHOUT CONTRAST TECHNIQUE: Multidetector CT imaging of the head and cervical spine was performed following the standard protocol without  intravenous contrast. Multiplanar CT image reconstructions of the cervical spine were also generated. RADIATION DOSE REDUCTION: This exam was performed according to the departmental dose-optimization program which includes automated exposure control, adjustment of the mA and/or kV according to patient size and/or use of iterative reconstruction technique. COMPARISON:  None Available. FINDINGS: CT HEAD FINDINGS Brain: No evidence of acute infarction, hemorrhage, hydrocephalus, extra-axial collection or mass lesion/mass effect. Periventricular and deep white matter hypodensity. Vascular: No hyperdense vessel or unexpected calcification. Skull: Normal. Negative for fracture or focal lesion. Sinuses/Orbits: No acute finding. Other: Soft tissue laceration of the right frontal scalp (series 2, image 21). CT CERVICAL SPINE FINDINGS Alignment: Positional straightening and reversal of the normal cervical lordosis. Skull base and vertebrae: No acute fracture. No primary bone lesion or focal pathologic process. Soft tissues and spinal canal: No prevertebral fluid or swelling. No visible canal hematoma. Disc levels:  Intact. Upper chest: Negative. Other: None. IMPRESSION: 1. No acute intracranial pathology. 2. Extensive periventricular and deep white matter hypodensity, which may reflect age advanced small-vessel white matter disease or demyelinating disorder. 3. Soft tissue laceration of the right frontal scalp. 4. No fracture or static subluxation of the cervical spine. Electronically Signed   By: Fredricka Jenny M.D.   On: 07/28/2023 11:19   CT Cervical Spine Wo Contrast Result Date: 07/28/2023 CLINICAL DATA:  Fall, head injury EXAM: CT HEAD WITHOUT CONTRAST CT CERVICAL SPINE WITHOUT CONTRAST TECHNIQUE: Multidetector CT imaging of the head and cervical spine was performed following the standard protocol without intravenous contrast. Multiplanar CT image reconstructions of the cervical spine were also generated. RADIATION  DOSE REDUCTION: This exam was performed according to the departmental dose-optimization program which includes automated exposure control, adjustment of the mA and/or kV according to patient size and/or use of iterative reconstruction technique. COMPARISON:  None Available. FINDINGS: CT HEAD FINDINGS Brain: No evidence of acute infarction, hemorrhage, hydrocephalus, extra-axial collection or mass lesion/mass effect. Periventricular and deep white matter hypodensity. Vascular: No hyperdense vessel or unexpected calcification. Skull: Normal. Negative for fracture or focal lesion. Sinuses/Orbits: No acute finding. Other: Soft tissue laceration of the right frontal scalp (series 2, image 21). CT CERVICAL SPINE FINDINGS Alignment: Positional straightening and reversal of the normal cervical lordosis. Skull base and vertebrae: No acute fracture. No primary bone lesion or focal pathologic process. Soft tissues and spinal canal: No prevertebral fluid or swelling. No visible canal hematoma. Disc levels:  Intact. Upper chest: Negative. Other: None. IMPRESSION: 1. No acute intracranial pathology. 2. Extensive periventricular and deep white matter hypodensity, which may reflect age advanced small-vessel white matter disease or demyelinating disorder. 3. Soft tissue laceration of the right frontal scalp.  4. No fracture or static subluxation of the cervical spine. Electronically Signed   By: Fredricka Jenny M.D.   On: 07/28/2023 11:19   DG Chest Port 1 View Result Date: 07/28/2023 CLINICAL DATA:  Shortness of breath EXAM: PORTABLE CHEST 1 VIEW COMPARISON:  June 21, 2023 FINDINGS: Bilateral reticular interstitial infiltrates correlate with congestive changes without consolidations. No pleural effusions Heart and mediastinum in the upper limits of normal with a left ventricular configuration IMPRESSION: Bilateral reticular interstitial infiltrates correlate with congestive changes without consolidations. Electronically Signed    By: Fredrich Jefferson M.D.   On: 07/28/2023 10:05     Telemetry    RSR, 60's to 70's - Personally Reviewed  Cardiac Studies   2D Echocardiogram 6.5.2025  1. Left ventricular ejection fraction, by estimation, is 20 to 25%. The  left ventricle has severely decreased function. The left ventricle  demonstrates global hypokinesis. There is moderate left ventricular  hypertrophy. Left ventricular diastolic  parameters are indeterminate.   2. Right ventricular systolic function is normal. The right ventricular  size is normal. Tricuspid regurgitation signal is inadequate for assessing  PA pressure.   3. Left atrial size was moderately dilated.   4. The mitral valve is normal in structure. Mild mitral valve  regurgitation. No evidence of mitral stenosis.   5. The aortic valve is normal in structure. Aortic valve regurgitation is  not visualized. No aortic stenosis is present.  _____________   Transesophageal Echocardiogram/Cardioversion - 6.6.2025   1. Left ventricular ejection fraction, by estimation, is <20%. The left  ventricle has severely decreased function. The left ventricle demonstrates  global hypokinesis.   2. Right ventricular systolic function is severely reduced. The right  ventricular size is normal.   3. Left atrial size was severely dilated. No left atrial/left atrial  appendage thrombus was detected.   4. Right atrial size was mildly dilated.   5. The mitral valve was not well visualized. not well visualized mitral  valve regurgitation.   6. The aortic valve is tricuspid. Aortic valve regurgitation is not  visualized. No aortic stenosis is present.   7. Pulmonic valve regurgitation not assessed. 8. DCCV w/ 200 J  Sinus rhythm.  Patient Profile     58 y.o. male with HFrEF, NICM, ETOH use, DM2, morbid obesity, PAF/flutter (h/o GIB on apixaban ),  OSA on CPAP, CKD stage IV (Scr ~3.3), nonobs CAD, and medication noncompliance, who was admitted following syncope x 2, found  to have viral URI and aflutter w/ RVR, s/p TEE/DCCV 07/30/2023.  Assessment & Plan    1. Syncope x 2:  unclear etiology - may be related to rapid aflutter in setting of URI, though pt also high risk for VT and V:Q + for PE.  EF 20-25% by echo this admission, RV w/ sev HK on TEE.  S/p DCCV.  No events on tele.  Has been on IV amio - will transition to PO.  Historically poor ICD candidate due to noncompliance.  Refused LifeVest.  Will need zio @ discharge (not available on weekends - if he goes home, we will have to arrange mailing to him).  No driving.  2.  Acute on chronic combined syst/diast CHF/NICM:  EF 20-25% by echo this admission.  Etiology unclear (AF, LBBB, ETOH, HTN).  Minimal CAD on cath in 2016.  Historically noncompliant in outpt setting.  Overall felt to have end-stage HF w/ poor insight into his illness - prev refused palliative care and is poor ICD/CRT-D candidate due to  noncompliance.  Dyspneic on admission w/ BNP 826.8 on admission w/ CHF on CXR, however creat bumped w/ diuresis - lasix  on hold following total of 80mg  IV on 6/4.  BUN/Creat sl worse @ 77/4.81 (70/4.41 yesterday).  + 65.9 ml yesterday (received 500 ml LR bolus).  Minus 1.8 L since admission.  Wt up 1.3 kg this AM @ 130.3 kg (standing scale).  D/c wt on 06/30/2023 was 124 kg.  On exam, he is lying flat w/o difficult.  Lungs CTA.  He feels like abd is tight, though soft by my exam.  No JVD.  GDMT limited by CKD IV. Rx bidil  in outpt setting.  Pressures improved, will resume.  SGLT2i has been on hold since admission.  Look to resume if renal fxn stable.  Clinical picture of worsening renal function and increasing wt in setting of severe LV dysfxn concerning for cardiorenal/low-output HF.  Will see if nsg staff can obtain ReDS vest reading.  Concerned that he may require invasiv monitoring and inotropic support. Will d/w Dr. Micael Adas.  3.  URI:  Resp panel + for coronavirus OC43.  COVID-19 neg.  Symptoms resolved.  4.  PAF/Flutter:   Prior cardioversions in 04/2022  and 12/2022.  Presented in recurrent flutter w/ RVR.  Amio reloaded.  S/p TEE/DCCV 6/6.  Maintaining sinus rhythm.  Still on IV amio.  Will transition to PO. Cont eliquis  (CHA2DS2VASc = 6).  5.  CKD IV:  BUN/Creat sl worse @ 77/4.81 (70/4.41 yesterday).  Received one dose of lasix  on admission, none since, and 500 ml LR yesterday.  Was on torsemide  40 daily @ home, which has been on hold.  See #2.  Avoiding nephrotoxic agents.  6.  Nonobs CAD:  Cath 2016.  Cont statin.  No asa in setting of eliquis .  7.  DMII:  A1c 7.3 06/2023.  Per IM.  Was on jardiance  @ home - not ordered here.  If renal fxn stabilizes, would resume.  8.  Possible RLL PE:  V:Q  scan concerning for RLL PE.  RV w/ sev HK on TEE. Discussed w/ Dr. Micael Adas. Will increase eliquis  to PE dosing - 10 BID x 7 days.   Signed, Laneta Pintos, NP  07/31/2023, 9:43 AM    For questions or updates, please contact   Please consult [email or web address] for contact info under Cardiology/STEMI.

## 2023-07-31 NOTE — TOC Initial Note (Signed)
 Transition of Care [facility]) - Initial/Assessment Note    Patient Details  Name: Hector [name] MRN: [id number] Date of Birth: 19-Jan-1966  [facility]) CM/SW Contact:    Alexandra Ice, RN Phone Number: 07/31/2023, 11:46 AM  Clinical Narrative:                 Patient lives with parents, in single level home. Home is ramp accessible. He does not use any equipment at home. He has PCP. No TOC needs identified at this time, will continue to follow for discharge needs        Patient Goals and CMS Choice            Expected Discharge Plan and Services                                              Prior Living Arrangements/Services                       Activities of Daily Living      Permission Sought/Granted                  Emotional Assessment              Admission diagnosis:  Acute pulmonary edema (HCC) [J81.0] Acute respiratory failure with hypoxia (HCC) [J96.01] Syncope, unspecified syncope type [R55] Acute on chronic HFrEF (heart failure with reduced ejection fraction) (HCC) [I50.23] Patient Active Problem List   Diagnosis Date Noted   Acute on chronic HFrEF (heart failure with reduced ejection fraction) (HCC) 07/28/2023   Syncope 07/28/2023   COPD with acute exacerbation (HCC) 07/28/2023   Intractable vomiting with nausea 06/21/2023   NSTEMI (non-ST elevated myocardial infarction) (HCC) 06/21/2023   Polycythemia 01/16/2023   Acute pancreatitis 09/26/2022   Abdominal pain, epigastric 04/27/2022   Alcoholic cirrhosis of liver without ascites (HCC) 04/27/2022   CHF exacerbation (HCC) 04/26/2022   Hyperkalemia 04/25/2022   Hyperbilirubinemia 04/25/2022   Thrombocytopenia (HCC) 04/25/2022   History of GI bleed 04/25/2022   Acute renal failure superimposed on stage 4 chronic kidney disease (HCC) 02/23/2022   Hyponatremia 02/23/2022   Abdominal pain 02/23/2022   Acute kidney injury superimposed on CKD (HCC)  02/23/2022   Type II diabetes mellitus with renal manifestations (HCC) 02/23/2022   RSV infection 02/23/2022   HLD (hyperlipidemia) 01/24/2022   Myocardial injury 01/24/2022   Acute on chronic combined systolic (congestive) and diastolic (congestive) heart failure (HCC) 01/06/2022   Type 2 diabetes mellitus (HCC) 12/10/2021   Pulmonary nodule 12/07/2021   Acute on chronic congestive heart failure (HCC) 11/18/2021   Gout 11/18/2021   Atrial fibrillation with RVR (HCC) 10/13/2021   Obesity (BMI 30-39.9) 10/01/2021   End-stage systolic heart failure, acute on chronic (HCC) 09/28/2021   Right upper quadrant pain 09/10/2021   Nausea and vomiting 09/10/2021   Atrial flutter (HCC) 03/01/2021   Hypothyroidism 11/30/2020   Stroke (HCC)    Alcohol use, unspecified, in remission    Chronic renal impairment, stage 4 (severe) (HCC)    Chronic systolic CHF (congestive heart failure) (HCC)    Iron  deficiency anemia    CKD (chronic kidney disease) stage 4, GFR 15-29 ml/min (HCC) 07/21/2020   UTI (urinary tract infection) 01/30/2020   Acute renal failure superimposed on stage 3b chronic kidney disease (HCC) 12/31/2019   Acute respiratory  failure with hypoxia (HCC) 12/22/2019    Class: Acute   Acute on chronic systolic CHF (congestive heart failure) (HCC) 07/21/2016   OSA (obstructive sleep apnea) 04/21/2016   Nonischemic cardiomyopathy (HCC) 05/26/2015   Chronic combined systolic (congestive) and diastolic (congestive) heart failure (HCC) 04/01/2015   Essential hypertension 04/01/2015   Nonischemic cardiomyopathy: EF 20-25% 09/21/2014   Paroxysmal A-fib (HCC) 08/16/2014   Paroxysmal atrial flutter (HCC) 08/14/2014    Class: Acute   Hypertensive heart disease    Mixed hyperlipidemia    PCP:  Dionicia Frater, MD Pharmacy:   [facility] [address]), [place] - [address] [place]) [place] [address] Phone: [phone] Fax:  [phone]  [facility] [address] [place], [place] - [address] [place] [address] Phone: [phone] Fax: [phone]  [facility] - [facility] [address] [place] [address] Phone: [phone] Fax: [phone]     Social Drivers of Health (SDOH) Social History: SDOH Screenings   Food Insecurity: No Food Insecurity (07/30/2023)  Housing: Low Risk  (07/30/2023)  Transportation Needs: No Transportation Needs (07/30/2023)  Utilities: Not At Risk (07/30/2023)  Alcohol Screen: Low Risk  (01/19/2023)  Depression (PHQ2-9): Low Risk  (08/19/2021)  Financial Resource Strain: Low Risk  (01/19/2023)  Physical Activity: Inactive (01/04/2019)  Social Connections: Moderately Isolated (01/04/2019)  Stress: No Stress Concern Present (01/04/2019)  Tobacco Use: Medium Risk (07/28/2023)   SDOH Interventions:     Readmission Risk Interventions    04/27/2022   11:48 AM 02/24/2022    3:53 PM 12/09/2021    1:09 PM  Readmission Risk Prevention Plan  Transportation Screening Complete Complete Complete  Medication Review Oceanographer) Complete Complete Complete  PCP or Specialist appointment within 3-5 days of discharge Complete  Complete  SW Recovery Care/Counseling Consult Complete Complete Complete  Palliative Care Screening Not Applicable Not Applicable Not Applicable  Skilled Nursing Facility Not Applicable Not Applicable Not Applicable

## 2023-08-01 DIAGNOSIS — R55 Syncope and collapse: Secondary | ICD-10-CM | POA: Diagnosis not present

## 2023-08-01 LAB — BASIC METABOLIC PANEL WITH GFR
Anion gap: 10 (ref 5–15)
BUN: 79 mg/dL — ABNORMAL HIGH (ref 6–20)
CO2: 21 mmol/L — ABNORMAL LOW (ref 22–32)
Calcium: 8.1 mg/dL — ABNORMAL LOW (ref 8.9–10.3)
Chloride: 101 mmol/L (ref 98–111)
Creatinine, Ser: 4.32 mg/dL — ABNORMAL HIGH (ref 0.61–1.24)
GFR, Estimated: 15 mL/min — ABNORMAL LOW (ref >60)
Glucose, Bld: 155 mg/dL — ABNORMAL HIGH (ref 70–99)
Potassium: 4.4 mmol/L (ref 3.5–5.1)
Sodium: 132 mmol/L — ABNORMAL LOW (ref 135–145)

## 2023-08-01 LAB — MAGNESIUM: Magnesium: 2.5 mg/dL — ABNORMAL HIGH (ref 1.7–2.4)

## 2023-08-01 NOTE — Progress Notes (Signed)
 Cardiology Progress Note   Patient Name: Hector [name] Date of Encounter: 08/01/2023  Primary Cardiologist: Timothy Gollan, MD  Subjective   Tired.  Says he enjoys coming to hospital to catch up on rest.  Denies c/p or dyspnea.  Able to lie relatively flat. Objective   Inpatient Medications    Scheduled Meds:  allopurinol   100 mg Oral Daily   amiodarone   200 mg Oral BID   apixaban   10 mg Oral BID   Followed by   Cecily Cohen ON 08/07/2023] apixaban   5 mg Oral BID   atorvastatin   80 mg Oral Daily   ferrous sulfate   325 mg Oral Q breakfast   ipratropium-albuterol   3 mL Nebulization BID   isosorbide -hydrALAZINE   1 tablet Oral TID   levothyroxine   125 mcg Oral Q0600   predniSONE   40 mg Oral Q breakfast   sodium chloride  flush  3 mL Intravenous Q12H   Continuous Infusions:  PRN Meds: acetaminophen , ondansetron  (ZOFRAN ) IV, mouth rinse, sodium chloride  flush, traMADol    Vital Signs    Vitals:   08/01/23 0052 08/01/23 0452 08/01/23 0500 08/01/23 0732  BP: (!) 126/98 (!) 131/96  (!) 128/99  Pulse: 62 62  63  Resp: 18 17    Temp: 98.2 F (36.8 C) 98.2 F (36.8 C)  97.8 F (36.6 C)  TempSrc: Oral Oral    SpO2: 97% 96%  96%  Weight:   130.9 kg   Height:        Intake/Output Summary (Last 24 hours) at 08/01/2023 0738 Last data filed at 08/01/2023 0734 Gross per 24 hour  Intake --  Output 1450 ml  Net -1450 ml   Filed Weights   07/30/23 0516 07/31/23 0519 08/01/23 0500  Weight: 129 kg 130.3 kg 130.9 kg    Physical Exam   GEN: Well nourished, well developed, in no acute distress.  HEENT: Grossly normal.  Neck: Supple, no JVD, carotid bruits, or masses. Cardiac: RRR, distant, no rubs or gallops. No clubbing, cyanosis, edema.  Radials 2+, DP/PT 2+ and equal bilaterally.  Respiratory:  Respirations regular and unlabored, clear to auscultation bilaterally. GI: Obese, soft, nontender, nondistended, BS + x 4. MS: no deformity or atrophy. Skin: warm and dry, no  rash. Neuro:  Strength and sensation are intact. Psych: AAOx3.  Flat affect.  Labs    Chemistry Recent Labs  Lab 07/30/23 0625 07/31/23 0552 08/01/23 0519  NA 135 132* 132*  K 4.3 4.5 4.4  CL 105 101 101  CO2 21* 20* 21*  GLUCOSE 141* 164* 155*  BUN 70* 77* 79*  CREATININE 4.41* 4.87* 4.32*  CALCIUM  8.4* 8.3* 8.1*  GFRNONAA 15* 13* 15*  ANIONGAP 9 11 10      Hematology Recent Labs  Lab 07/28/23 0940 07/29/23 0409 07/30/23 0625  WBC 8.5 6.6 9.8  RBC 4.91 4.82 4.95  HGB 12.9* 13.1 13.3  HCT 41.4 40.4 41.4  MCV 84.3 83.8 83.6  MCH 26.3 27.2 26.9  MCHC 31.2 32.4 32.1  RDW 17.6* 18.0* 18.1*  PLT 221 230 251    Cardiac Enzymes  Recent Labs  Lab 07/28/23 0940 07/28/23 2047  TROPONINIHS 74* 110*      BNP    Component Value Date/Time   BNP 826.8 (H) 07/28/2023 0940    Lipids  Lab Results  Component Value Date   CHOL 141 01/09/2022   HDL 25 (L) 01/09/2022   LDLCALC 88 01/09/2022   TRIG 140 01/09/2022   CHOLHDL 5.6 01/09/2022  HbA1c  Lab Results  Component Value Date   HGBA1C 7.3 (H) 06/28/2023    Radiology    US  RENAL Result Date: 07/31/2023 CLINICAL DATA:  Acute renal injury EXAM: RENAL / URINARY TRACT ULTRASOUND COMPLETE COMPARISON:  06/21/2023 FINDINGS: Right Kidney: Renal measurements: 11.5 x 5.2 x 5.9 cm. = volume: 86 mL. Scattered simple cysts are noted measuring up to 3.5 cm. Left Kidney: Renal measurements: 10.2 x 5.3 x 5.3 cm. = volume: 149 mL. Scattered cysts are noted with the largest measuring 4.2 cm in greatest dimension. Bladder: Appears normal for degree of bladder distention. Other: None. IMPRESSION: Bilateral simple renal cysts.  No further follow-up is recommended. No acute abnormality noted. Electronically Signed   By: Violeta Grey M.D.   On: 07/31/2023 20:07   NM Pulmonary Perfusion Result Date: 07/28/2023 CLINICAL DATA:  [facility]-NMDeep venous thrombosis (DVT) EXAM: NUCLEAR MEDICINE PERFUSION LUNG SCAN TECHNIQUE: Perfusion images were  obtained in multiple projections after intravenous injection of radiopharmaceutical. RADIOPHARMACEUTICALS:  4.3 mCi Tc-88m MAA COMPARISON:  Chest radiograph 07/28/2023 FINDINGS: Single wedge-shaped peripheral perfusion defect within the posterior lateral RIGHT lower lobe seen only on posterior projection. No additional defects identified. IMPRESSION: Concern for RIGHT lower lobe pulmonary embolism. Electronically Signed   By: Deboraha Fallow M.D.   On: 07/28/2023 16:31   CT Head Wo Contrast Result Date: 07/28/2023 CLINICAL DATA:  Fall, head injury EXAM: CT HEAD WITHOUT CONTRAST CT CERVICAL SPINE WITHOUT CONTRAST TECHNIQUE: Multidetector CT imaging of the head and cervical spine was performed following the standard protocol without intravenous contrast. Multiplanar CT image reconstructions of the cervical spine were also generated. RADIATION DOSE REDUCTION: This exam was performed according to the departmental dose-optimization program which includes automated exposure control, adjustment of the mA and/or kV according to patient size and/or use of iterative reconstruction technique. COMPARISON:  None Available. FINDINGS: CT HEAD FINDINGS Brain: No evidence of acute infarction, hemorrhage, hydrocephalus, extra-axial collection or mass lesion/mass effect. Periventricular and deep white matter hypodensity. Vascular: No hyperdense vessel or unexpected calcification. Skull: Normal. Negative for fracture or focal lesion. Sinuses/Orbits: No acute finding. Other: Soft tissue laceration of the right frontal scalp (series 2, image 21). CT CERVICAL SPINE FINDINGS Alignment: Positional straightening and reversal of the normal cervical lordosis. Skull base and vertebrae: No acute fracture. No primary bone lesion or focal pathologic process. Soft tissues and spinal canal: No prevertebral fluid or swelling. No visible canal hematoma. Disc levels:  Intact. Upper chest: Negative. Other: None. IMPRESSION: 1. No acute intracranial  pathology. 2. Extensive periventricular and deep white matter hypodensity, which may reflect age advanced small-vessel white matter disease or demyelinating disorder. 3. Soft tissue laceration of the right frontal scalp. 4. No fracture or static subluxation of the cervical spine. Electronically Signed   By: Fredricka Jenny M.D.   On: 07/28/2023 11:19   CT Cervical Spine Wo Contrast Result Date: 07/28/2023 CLINICAL DATA:  Fall, head injury EXAM: CT HEAD WITHOUT CONTRAST CT CERVICAL SPINE WITHOUT CONTRAST TECHNIQUE: Multidetector CT imaging of the head and cervical spine was performed following the standard protocol without intravenous contrast. Multiplanar CT image reconstructions of the cervical spine were also generated. RADIATION DOSE REDUCTION: This exam was performed according to the departmental dose-optimization program which includes automated exposure control, adjustment of the mA and/or kV according to patient size and/or use of iterative reconstruction technique. COMPARISON:  None Available. FINDINGS: CT HEAD FINDINGS Brain: No evidence of acute infarction, hemorrhage, hydrocephalus, extra-axial collection or mass lesion/mass effect. Periventricular and deep white  matter hypodensity. Vascular: No hyperdense vessel or unexpected calcification. Skull: Normal. Negative for fracture or focal lesion. Sinuses/Orbits: No acute finding. Other: Soft tissue laceration of the right frontal scalp (series 2, image 21). CT CERVICAL SPINE FINDINGS Alignment: Positional straightening and reversal of the normal cervical lordosis. Skull base and vertebrae: No acute fracture. No primary bone lesion or focal pathologic process. Soft tissues and spinal canal: No prevertebral fluid or swelling. No visible canal hematoma. Disc levels:  Intact. Upper chest: Negative. Other: None. IMPRESSION: 1. No acute intracranial pathology. 2. Extensive periventricular and deep white matter hypodensity, which may reflect age advanced  small-vessel white matter disease or demyelinating disorder. 3. Soft tissue laceration of the right frontal scalp. 4. No fracture or static subluxation of the cervical spine. Electronically Signed   By: Fredricka Jenny M.D.   On: 07/28/2023 11:19   DG Chest Port 1 View Result Date: 07/28/2023 CLINICAL DATA:  Shortness of breath EXAM: PORTABLE CHEST 1 VIEW COMPARISON:  June 21, 2023 FINDINGS: Bilateral reticular interstitial infiltrates correlate with congestive changes without consolidations. No pleural effusions Heart and mediastinum in the upper limits of normal with a left ventricular configuration IMPRESSION: Bilateral reticular interstitial infiltrates correlate with congestive changes without consolidations. Electronically Signed   By: Fredrich Jefferson M.D.   On: 07/28/2023 10:05     Telemetry    RSR, 60's to 70's occas PVC/couplet - Personally Reviewed  Cardiac Studies   2D Echocardiogram 6.5.2025   1. Left ventricular ejection fraction, by estimation, is 20 to 25%. The  left ventricle has severely decreased function. The left ventricle  demonstrates global hypokinesis. There is moderate left ventricular  hypertrophy. Left ventricular diastolic  parameters are indeterminate.   2. Right ventricular systolic function is normal. The right ventricular  size is normal. Tricuspid regurgitation signal is inadequate for assessing  PA pressure.   3. Left atrial size was moderately dilated.   4. The mitral valve is normal in structure. Mild mitral valve  regurgitation. No evidence of mitral stenosis.   5. The aortic valve is normal in structure. Aortic valve regurgitation is  not visualized. No aortic stenosis is present.  _____________    Transesophageal Echocardiogram/Cardioversion - 6.6.2025    1. Left ventricular ejection fraction, by estimation, is <20%. The left  ventricle has severely decreased function. The left ventricle demonstrates  global hypokinesis.   2. Right ventricular  systolic function is severely reduced. The right  ventricular size is normal.   3. Left atrial size was severely dilated. No left atrial/left atrial  appendage thrombus was detected.   4. Right atrial size was mildly dilated.   5. The mitral valve was not well visualized. not well visualized mitral  valve regurgitation.   6. The aortic valve is tricuspid. Aortic valve regurgitation is not  visualized. No aortic stenosis is present.   7. Pulmonic valve regurgitation not assessed. 8. DCCV w/ 200 J  Sinus rhythm.  Patient Profile     58 y.o. male with HFrEF, NICM, ETOH use, DM2, morbid obesity, PAF/flutter (h/o GIB on apixaban ),  OSA on CPAP, CKD stage IV (Scr ~3.3), nonobs CAD, and medication noncompliance, who was admitted following syncope x 2, found to have viral URI and aflutter w/ RVR, s/p TEE/DCCV 07/30/2023.   Assessment & Plan    1. Syncope x 2:  unclear etiology - may be related to rapid aflutter in setting of URI, though pt also high risk for VT and V:Q + for PE.  EF 20-25%  by echo this admission, RV w/ sev HK on TEE.  S/p DCCV.  No events on tele.  Has been on IV amio - will transition to PO.  Historically poor ICD candidate due to noncompliance.  Refused LifeVest.  Will need zio @ discharge.  No driving.   2.  Acute on chronic combined syst/diast CHF/NICM:  EF 20-25% by echo this admission.  Etiology unclear (AF, LBBB, ETOH, HTN).  Minimal CAD on cath in 2016.  Historically noncompliant in outpt setting.  Overall felt to have end-stage HF w/ poor insight into his illness - prev refused palliative care and is poor ICD/CRT-D candidate due to noncompliance.  Dyspneic on admission w/ BNP 826.8 on admission w/ CHF on CXR, however creat bumped w/ diuresis - lasix  on hold following total of 80mg  IV on 6/4.  BUN higher @ 79 w/ sl improvement in Cr @ 4.32 (77/4.81 6/7).  I/O inaccurate as intake not recorded in days.  Wt up 0.6 kg this AM @ 130.9 kg (standing scale), which is 4.2 kg since  admission.  D/c wt on 06/30/2023 was 124 kg.  He denies dyspnea and is able to lie relatively flat.  Lungs clear, abd soft.  Pt won't cooperate for good examination of neck veins.  GDMT limited by CKD IV.  Bidil  resumed 6/7.  SGLT2i has been on hold since admission in light of worsening renal fxn.  Cont to follow renal fxn - some concern for low output.  Pt declined placement of central line and says that he would not want milrinone .   3.  URI:  Resp panel + for coronavirus OC43.  COVID-19 neg.  Says that he still has some sinus congestion.   4.  PAF/Flutter:  Prior cardioversions in 04/2022  and 12/2022.  Presented in recurrent flutter w/ RVR.  Amio reloaded.  S/p TEE/DCCV 6/6.  Maintaining sinus rhythm.  Still on IV amio.  Cont amio and eliquis  (CHA2DS2VASc = 6).   5.  CKD IV:  BUN higher @ 79 w/ sl improvement in Cr @ 4.32.  Received two doses of lasix  on admission, none since.  Was on torsemide  40 daily @ home, which has been on hold.  See #2.  Avoiding nephrotoxic agents.   6.  Nonobs CAD:  Cath 2016.  Cont statin.  No asa in setting of eliquis .   7.  DMII:  A1c 7.3 06/2023.  Per IM.  Was on jardiance  @ home - not ordered here.  If renal fxn stabilizes, would resume.   8.  RLL PE:  V:Q  scan concerning for RLL PE.  RV w/ sev HK on TEE. Discussed w/ Dr. Micael Adas. Eliquis  increased to PE dosing - 10 BID x 7 days on 6/7.    Signed, Laneta Pintos, NP  08/01/2023, 7:38 AM    For questions or updates, please contact   Please consult [email or web address] for contact info under Cardiology/STEMI.

## 2023-08-01 NOTE — Progress Notes (Signed)
 PROGRESS NOTE    Hector [name]  OZD:[id number] DOB: Apr 16, 1965 DOA: 07/28/2023 PCP: Dionicia Frater, MD  [id number]-AA  LOS: 4 days   Brief hospital course:   Assessment & Plan: Hector [name] is a 58 y.o. male with medical history significant of HFrEF, atrial fibrillation on Eliquis , type 2 diabetes, OSA not compliant with CPAP, CKD stage IV, alcohol use disorder in remission, nonobstructive CAD, who presents to the ED due to ground-level fall.   Hector [name] states that approximately 5 days ago, he began to come down with a cold, endorsing productive cough and shortness of breath.  He endorses poor appetite, headache that began at the same time.  Due to poor appetite, he did not take any of his medicines until 2 days ago.  He notes that he restarted on because he was having palpitations and felt as though he was putting weight on.  Then today, he felt weak all over.   On EMS arrival, patient was noted to be in atrial fibrillation with RVR with low blood pressure.    Syncope Patient is presenting with a syncopal episode with loss of consciousness leading to a right frontal scalp laceration.  Unclear etiology, suspect my be related to AFL but also at high-risk for VT. Also may have PE. Has not been ICD candidate due to poor compliance.  Refuses LifeVest. --Zio at discharge? --cont tele  PE? VQ scan suggestive of this - apixaban  loading dose underway  Acute on chronic HFrEF (heart failure with reduced ejection fraction) (HCC) Long history of HFrEF with EF < 20% in the setting of non-ischemic cardiomyopathy. He does not appear profoundly hypervolemic, however BNP is elevated and there is concern for pulmonary edema on CXR. He notes he did not take Torsemide  for several days.  but looks well-compensated on exam, per cardio. A-flutter may be contributing --Cr went up with diuresis --holding diuretic - GDMT limited by kidney function - may need inotropic support, central  line - advanced heart failure team to re-eval tomorrow  Atrial flutter with RVR (HCC) Medication non-compliance Cardioverted here 6/6 - Previously seen by EP -> not candidate for ablation  --cont amio   COPD with acute exacerbation (HCC) Coronavirus OC43 URI Patient reports increased cough and shortness of breath, in addition to poor p.o. intake, body aches and headache that began 5 days ago.  RVP pos for Coronavirus OC43.  Started on prednisone  and DuoNeb. Doing well, no hypoxia or wheeze --cont prednisone  and DuoNeb, today last day (day 5) of steroids  Type 2 diabetes mellitus (HCC) --A1c 7.3 --refuses SSI and fingersticks  Hypothyroidism --cont Synthroid   AKI on Chronic renal impairment, stage 4 (severe) (HCC) History of CKD Stage IV with creatinine ranging from 3.1 up to 4, currently up-trending, k wnl and no sig acidosis, renal u/s no obstruction (and bladder scan neg), gfr improved today from 13 to 15. Adequate uop.   Alcohol use, unspecified, in remission - Daily folic acid , thiamine  and multivitamin  OSA (obstructive sleep apnea) Patient reports noncompliance with CPAP.  Goals of care Lengthy discussion 6/7 at bedside about code status, shared that should cardiopulmonary arrest occur, chances of success are not great, and odds of meaningful recovery are low given baseline severe chf and ckd. Patient elects DNR/DNI.   DVT prophylaxis: On:Eliquis  Code Status: dnr/dni Family Communication: none at bedside Level of care: Telemetry Cardiac Dispo:   The patient is from: home Anticipated d/c is to: home Anticipated d/c date is: 1 day  Subjective and Interval History:  Pt reported breathing is fine, urinating fine, tolerating diet, no dyspnea when lying flat   Objective: Vitals:   08/01/23 0452 08/01/23 0500 08/01/23 0732 08/01/23 1145  BP: (!) 131/96  (!) 128/99 108/85  Pulse: 62  63 63  Resp: 17     Temp: 98.2 F (36.8 C)  97.8 F (36.6 C) 98 F (36.7 C)   TempSrc: Oral     SpO2: 96%  96% 94%  Weight:  130.9 kg    Height:        Intake/Output Summary (Last 24 hours) at 08/01/2023 1228 Last data filed at 08/01/2023 1146 Gross per 24 hour  Intake --  Output 1725 ml  Net -1725 ml   Filed Weights   07/30/23 0516 07/31/23 0519 08/01/23 0500  Weight: 129 kg 130.3 kg 130.9 kg    Examination:   Constitutional: NAD, AAOx3 HEENT: conjunctivae and lids normal, EOMI CV: No cyanosis.  rr RESP: normal respiratory effort, on RA, no wheeze Extremities: warm, no edema SKIN: warm, dry Neuro: moving all 4, non-focal   Data Reviewed: I have personally reviewed labs and imaging studies    Raymonde Calico, MD [facility] If 7PM-7AM, please contact night-coverage 08/01/2023, 12:28 PM

## 2023-08-01 NOTE — Plan of Care (Signed)

## 2023-08-02 ENCOUNTER — Encounter: Payer: Self-pay | Admitting: Internal Medicine

## 2023-08-02 LAB — BASIC METABOLIC PANEL WITH GFR
Anion gap: 8 (ref 5–15)
BUN: 71 mg/dL — ABNORMAL HIGH (ref 6–20)
CO2: 23 mmol/L (ref 22–32)
Calcium: 8.3 mg/dL — ABNORMAL LOW (ref 8.9–10.3)
Chloride: 102 mmol/L (ref 98–111)
Creatinine, Ser: 4.09 mg/dL — ABNORMAL HIGH (ref 0.61–1.24)
GFR, Estimated: 16 mL/min — ABNORMAL LOW (ref >60)
Glucose, Bld: 216 mg/dL — ABNORMAL HIGH (ref 70–99)
Potassium: 5.1 mmol/L (ref 3.5–5.1)
Sodium: 133 mmol/L — ABNORMAL LOW (ref 135–145)

## 2023-08-02 LAB — MAGNESIUM: Magnesium: 2.7 mg/dL — ABNORMAL HIGH (ref 1.7–2.4)

## 2023-08-02 MED ORDER — ATORVASTATIN CALCIUM 80 MG PO TABS: 80.0000 mg | ORAL_TABLET | Freq: Every day | ORAL | 1 refills | Status: DC

## 2023-08-02 MED ORDER — APIXABAN 5 MG PO TABS: ORAL_TABLET | ORAL | 0 refills | Status: DC

## 2023-08-02 MED ORDER — ISOSORB DINITRATE-HYDRALAZINE 20-37.5 MG PO TABS: 1.0000 | ORAL_TABLET | Freq: Three times a day (TID) | ORAL | 0 refills | Status: AC

## 2023-08-02 MED ORDER — EMPAGLIFLOZIN 10 MG PO TABS: 10.0000 mg | ORAL_TABLET | Freq: Every day | ORAL | 5 refills | Status: DC

## 2023-08-02 MED ORDER — AMIODARONE HCL 200 MG PO TABS: 200.0000 mg | ORAL_TABLET | Freq: Every day | ORAL | 1 refills | Status: DC

## 2023-08-02 NOTE — Anesthesia Postprocedure Evaluation (Signed)
 Anesthesia Post Note  Patient: Hector [name]  Procedure(s) Performed: ECHOCARDIOGRAM, TRANSESOPHAGEAL CARDIOVERSION  Patient location during evaluation: Specials Recovery Anesthesia Type: General Level of consciousness: awake and alert Pain management: pain level controlled Vital Signs Assessment: post-procedure vital signs reviewed and stable Respiratory status: spontaneous breathing, nonlabored ventilation, respiratory function stable and patient connected to nasal cannula oxygen Cardiovascular status: blood pressure returned to baseline and stable Postop Assessment: no apparent nausea or vomiting Anesthetic complications: no   No notable events documented.   Last Vitals:  Vitals:   08/01/23 2311 08/02/23 0514  BP: (!) 124/92 (!) 146/109  Pulse: 61 (!) 57  Resp: 18   Temp: 36.8 C   SpO2: 96%     Last Pain:  Vitals:   08/01/23 2311  TempSrc: Oral  PainSc:                  Nancey Awkward

## 2023-08-02 NOTE — Progress Notes (Signed)
 Advanced Heart Failure Rounding Note   Subjective:    Overall doing well, creatinine came down over the weekend. Feeling better, would like to go home. Follow up arranged.     Objective:   Weight Range:  Vital Signs:   Temp:  [97.9 F (36.6 C)-98.3 F (36.8 C)] 97.9 F (36.6 C) (06/09 0821) Pulse Rate:  [57-62] 62 (06/09 0821) Resp:  [18-20] 20 (06/09 0821) BP: (124-148)/(92-109) 148/107 (06/09 0821) SpO2:  [96 %-98 %] 98 % (06/09 0821) Weight:  [131.6 kg] 131.6 kg (06/09 0500) Last BM Date : 07/29/23  Weight change: Filed Weights   07/31/23 0519 08/01/23 0500 08/02/23 0500  Weight: 130.3 kg 130.9 kg 131.6 kg    Intake/Output:   Intake/Output Summary (Last 24 hours) at 08/02/2023 2114 Last data filed at 08/02/2023 0900 Gross per 24 hour  Intake 600 ml  Output 500 ml  Net 100 ml     Physical Exam: General:  Sitting up on side of bed No resp difficulty Cor: RRR, no m/g/r, JVP flat Lungs: clear Abdomen: obese soft, nontender, nondistended. No hepatosplenomegaly. No bruits or masses. Good bowel sounds. Extremities: no cyanosis, clubbing, rash, edema Neuro: alert & orientedx3, cranial nerves grossly intact. moves all 4 extremities w/o difficulty. Affect pleasant    Labs: Basic Metabolic Panel: Recent Labs  Lab 07/29/23 0409 07/30/23 0625 07/31/23 0552 08/01/23 0519 08/02/23 0438  NA 134* 135 132* 132* 133*  K 4.2 4.3 4.5 4.4 5.1  CL 104 105 101 101 102  CO2 21* 21* 20* 21* 23  GLUCOSE 203* 141* 164* 155* 216*  BUN 52* 70* 77* 79* 71*  CREATININE 3.93* 4.41* 4.87* 4.32* 4.09*  CALCIUM  8.3* 8.4* 8.3* 8.1* 8.3*  MG 2.0 2.3 2.4 2.5* 2.7*       Assessment/Plan:    1. Syncopal episode x 2 - Unclear etiology suspect my be related to AFL in setting of URI but also at high-risk for VT. VQ + for PE - Remained quiet on tele, no further events, s/p DCCV - continue PO amiodarone  200mg  daily at discharge - Echo 07/29/23 EF 20-25% RV ok  - no driving x 6  months - has not been ICD candidate due to poor compliance - Refuses LifeVest - Zio patch at discharge   2. 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 Not interested in Palliative Care referral - Well compensated, will restart his jardiance  today, can likely restart diuretics at follow up - Discharge on bidil  1 tab TID, no ARB/ARNI/MRA with renal function  3. URI - RP + for coronavirus OC43 (COVID-19 negative) - symptoms resolved   4. Paroxysmal atrial fibrillation/atrial flutter- - TEE/DC-CV 3/24  - Last DC-CV on 6/6, successful - continue apixaban  5 mg twice daily for CHA2DS2-VASc score of at least 6 - Previously seen by EP -> not candidate for ablation   5. CKD stage IV- - Baseline Scr 2.8-3.4 - down to 4.09 this morning after diuresis - Hold lasix    6. Nonobstructive coronary artery disease- - Cath 2016 - saw cardiology ([place]) 04/24 - continue atorvastatin   80mg  daily - LDL 01/09/22 was 88   7. Obstructive sleep apnea- - does not wear CPAP and says that he hasn't worn it in "forever" and not interested in trying to wear it at this time - last sleep study done 12/2015   8. Type 2 diabetes- -A1c 7.7% on 01/17/23 -Continue SGLT2i -Per PCP  9. Positive VQ - possible RLL PE - continue apixaban  - Will not increase Eliquis  at this time   Length of Stay: 5   Lauralee Poll MD 08/02/2023, 9:14 PM  Advanced Heart Failure Team Pager [phone] (M-F; 7a - 4p)  Please contact [facility] for night-coverage after hours (4p -7a ) and weekends on [email or web address]

## 2023-08-02 NOTE — Progress Notes (Signed)
 Heart Failure Navigator Progress Note  Patient had previously scheduled appointment with the [facility] on 08/03/23. Patient is currently hospitalized so appointment changed to 08/06/23.  Patient aware of appointment date and time change.  Will continue to watch patient if appointment needs to be rescheduled due to hospitalization.  Navigator will sign off at this time.  Celedonio Coil, RN, BSN [facility] Heart Failure Navigator Secure Chat Only

## 2023-08-02 NOTE — Plan of Care (Signed)

## 2023-08-02 NOTE — Discharge Summary (Signed)
 Hector [name]:[id number] DOB: 06-14-1965 DOA: 07/28/2023  PCP: Dionicia Frater, MD  Admit date: 07/28/2023 Discharge date: 08/02/2023  Time spent: 35 minutes  Recommendations for Outpatient Follow-up:  Chf clinic f/u 6/13 as scheduled Pcp f/u 1-2 weeks  Nephrology f/u    Discharge Diagnoses:  Principal Problem:   Syncope Active Problems:   Atrial flutter with rapid ventricular response (HCC)   Acute on chronic combined systolic and diastolic CHF (congestive heart failure) (HCC)   Acute on chronic HFrEF (heart failure with reduced ejection fraction) (HCC)   Atrial fibrillation with RVR (HCC)   COPD with acute exacerbation (HCC)   OSA (obstructive sleep apnea)   Biventricular failure (HCC)   Alcohol use, unspecified, in remission   Chronic renal impairment, stage 4 (severe) (HCC)   Hypothyroidism   Type 2 diabetes mellitus (HCC)   Coronary artery disease due to lipid rich plaque   Pulmonary embolus (HCC)   Discharge Condition: stable  Diet recommendation: heart healthy  Filed Weights   07/31/23 0519 08/01/23 0500 08/02/23 0500  Weight: 130.3 kg 130.9 kg 131.6 kg    History of present illness:  From admission h and p Hector [name] is a 58 y.o. male with medical history significant of HFrEF, atrial fibrillation on Eliquis , type 2 diabetes, OSA not compliant with CPAP, CKD stage IV, alcohol use disorder in remission, nonobstructive CAD, who presents to the ED due to ground-level fall.   Hector [name] states that approximately 5 days ago, he began to come down with a cold, endorsing productive cough and shortness of breath.  He endorses poor appetite, headache that began at the same time.  Due to poor appetite, he did not take any of his medicines until 2 days ago.  He notes that he restarted on because he was having palpitations and felt as though he was putting weight on.  Then today, he felt weak all over.  He does not recall exactly what happened when he  fell.  Due to this, EMS was called.   On EMS arrival, patient was noted to be in atrial fibrillation with RVR with low blood pressure.  Hospital Course:   Syncope Patient is presenting with a syncopal episode with loss of consciousness leading to a right frontal scalp laceration.  Unclear etiology, suspect my be related to AFL but also at high-risk for VT. Also may have PE. Has not been ICD candidate due to poor compliance.  Refuses LifeVest.   PE? VQ scan suggestive of this - apixaban  loading dose underway through 6/14 then resume BID dosing, ensure compliance. Relatively asymptomatic, no hypoxia or dyspnea   Acute on chronic HFrEF (heart failure with reduced ejection fraction) (HCC) Long history of HFrEF with EF < 20% in the setting of non-ischemic cardiomyopathy. He does not appear profoundly hypervolemic, however BNP is elevated and there is concern for pulmonary edema on CXR. He notes he did not take Torsemide  for several days.  but looks well-compensated on exam, per cardio. A-flutter may be contributing --Cr went up with diuresis, now on pause,  -- heart failure team advises hold torsemide  at discharge pending chf clinic f/u 6/13, resume jardiance    Atrial flutter with RVR (HCC) Medication non-compliance Cardioverted here 6/6 - Previously seen by EP -> not candidate for ablation  --cont amio , apixaban    COPD with acute exacerbation (HCC) Coronavirus OC43 URI Patient reports increased cough and shortness of breath, in addition to poor p.o. intake, body aches and headache that began  5 days ago.  RVP pos for Coronavirus OC43.  Started on prednisone  and DuoNeb. Doing well, no hypoxia or wheeze, completed a 5 day course of steroids   Type 2 diabetes mellitus (HCC) Patient says he doesn't have diabetes and declines diabetes meds, says knows nothing about the glipizide that's on his med list. Here glucose elevated but did receive steroids. Advised close f/u with pcp.    Hypothyroidism --on Synthroid    AKI on Chronic renal impairment, stage 4 (severe) (HCC) History of CKD Stage IV with creatinine ranging from 3.1 up to 4, gfr here stable around 15, making adequate urine w/o sig acid/base disturbance or hyperkalemia, advise close nephrology f/u   OSA (obstructive sleep apnea) Patient reports noncompliance with CPAP.   Goals of care Lengthy discussion 6/7 at bedside about code status, shared that should cardiopulmonary arrest occur, chances of success are not great, and odds of meaningful recovery are low given baseline severe chf and ckd. Patient elects DNR/DNI.  Procedures: cardioversion   Consultations: cardiology  Discharge Exam: Vitals:   08/02/23 0514 08/02/23 0821  BP: (!) 146/109 (!) 148/107  Pulse: (!) 57 62  Resp:  20  Temp:  97.9 F (36.6 C)  SpO2:  98%    Constitutional: NAD, AAOx3 HEENT: conjunctivae and lids normal, EOMI CV: No cyanosis.  rr RESP: normal respiratory effort, on RA, no wheeze Extremities: warm, no edema SKIN: warm, dry Neuro: moving all 4, non-focal  Discharge Instructions   Discharge Instructions     Diet - low sodium heart healthy   Complete by: As directed    Increase activity slowly   Complete by: As directed       Allergies as of 08/02/2023       Reactions   Esomeprazole Magnesium  Other (See Comments)   Suspected interstitial nephritis 2018   Egg-derived Products Rash        Medication List     PAUSE taking these medications    torsemide  20 MG tablet Wait to take this until your doctor or other care provider tells you to start again. Commonly known as: DEMADEX  Take 2 tablets (40 mg total) by mouth daily.       STOP taking these medications    glipiZIDE 5 MG 24 hr tablet Commonly known as: GLUCOTROL XL       TAKE these medications    allopurinol  100 MG tablet Commonly known as: ZYLOPRIM  Take 100 mg by mouth daily.   amiodarone  200 MG tablet Commonly known as:  PACERONE  Take 1 tablet (200 mg total) by mouth daily.   apixaban  5 MG Tabs tablet Commonly known as: ELIQUIS  Take 2 tabs twice a day until 6/14, then take 1 tab twice a day What changed:  how much to take how to take this when to take this additional instructions   atorvastatin  80 MG tablet Commonly known as: LIPITOR  Take 1 tablet (80 mg total) by mouth daily.   calcitRIOL 0.25 MCG capsule Commonly known as: ROCALTROL Take 0.25 mcg by mouth 3 (three) times a week.   empagliflozin  10 MG Tabs tablet Commonly known as: Jardiance  Take 1 tablet (10 mg total) by mouth daily.   ferrous sulfate  325 (65 FE) MG EC tablet Take 325 mg by mouth daily with breakfast.   gabapentin  100 MG capsule Commonly known as: NEURONTIN  Take 1 capsule (100 mg total) by mouth 2 (two) times daily.   isosorbide -hydrALAZINE  20-37.5 MG tablet Commonly known as: BIDIL  Take 1 tablet by mouth 3 (three)  times daily.   levothyroxine  125 MCG tablet Commonly known as: SYNTHROID  Take 125 mcg by mouth daily before breakfast.   lipase/protease/amylase 16109 UNITS Cpep capsule Commonly known as: CREON  Take 1 capsule (36,000 Units total) by mouth 3 (three) times daily before meals.   polyethylene glycol 17 g packet Commonly known as: MIRALAX  / GLYCOLAX  Take 17 g by mouth daily as needed for mild constipation.       Allergies  Allergen Reactions   Esomeprazole Magnesium  Other (See Comments)    Suspected interstitial nephritis 2018   Egg-Derived Products Rash    Follow-up Information     Bender, Lurlean Salmons, MD Follow up.   Specialty: Family Medicine Why: 1-2 weeks Contact information: [address] [place] [address] [phone]                  The results of significant diagnostics from this hospitalization (including imaging, microbiology, ancillary and laboratory) are listed below for reference.    Significant Diagnostic Studies: US  RENAL Result Date:  07/31/2023 CLINICAL DATA:  Acute renal injury EXAM: RENAL / URINARY TRACT ULTRASOUND COMPLETE COMPARISON:  06/21/2023 FINDINGS: Right Kidney: Renal measurements: 11.5 x 5.2 x 5.9 cm. = volume: 86 mL. Scattered simple cysts are noted measuring up to 3.5 cm. Left Kidney: Renal measurements: 10.2 x 5.3 x 5.3 cm. = volume: 149 mL. Scattered cysts are noted with the largest measuring 4.2 cm in greatest dimension. Bladder: Appears normal for degree of bladder distention. Other: None. IMPRESSION: Bilateral simple renal cysts.  No further follow-up is recommended. No acute abnormality noted. Electronically Signed   By: Violeta Grey M.D.   On: 07/31/2023 20:07   ECHO TEE Result Date: 07/30/2023    TRANSESOPHOGEAL ECHO REPORT   Patient Name:   FRANZ SVEC Date of Exam: 07/30/2023 Medical Rec #:  [id number]           Height:       71.0 in Accession #:    [id number]          Weight:       284.3 lb Date of Birth:  Aug 03, 1965           BSA:          2.449 m Patient Age:    57 years            BP:           108/95 mmHg Patient Gender: M                   HR:           72 bpm. Exam Location:  [facility] Procedure: Transesophageal Echo, Cardiac Doppler and Color Doppler (Both            Spectral and Color Flow Doppler were utilized during procedure). Indications:     Not listed on Tee check-in sheet  History:         Patient has prior history of Echocardiogram examinations, most                  recent 07/29/2023. Risk Factors:Dyslipidemia. Paroxysmal Afib.  Sonographer:     Broadus Canes Referring Phys:  [id number] DANIEL R BENSIMHON Diagnosing Phys: Jules Oar MD PROCEDURE: The transesophogeal probe was passed without difficulty through the esophogus of the patient. Sedation performed by different physician. The patient developed Respiratory depression during the procedure.  IMPRESSIONS  1. Left ventricular ejection fraction, by estimation, is <20%. The left ventricle  has severely decreased function. The left ventricle demonstrates  global hypokinesis.  2. Right ventricular systolic function is severely reduced. The right ventricular size is normal.  3. Left atrial size was severely dilated. No left atrial/left atrial appendage thrombus was detected.  4. Right atrial size was mildly dilated.  5. The mitral valve was not well visualized. not well visualized mitral valve regurgitation.  6. The aortic valve is tricuspid. Aortic valve regurgitation is not visualized. No aortic stenosis is present.  7. Pulmonic valve regurgitation not assessed. Conclusion(s)/Recommendation(s): No LA/LAA thrombus identified. Successful cardioversion performed with restoration of normal sinus rhythm. TEE aborted prior to completion of study due to respiratory distress. FINDINGS  Left Ventricle: Left ventricular ejection fraction, by estimation, is <20%. The left ventricle has severely decreased function. The left ventricle demonstrates global hypokinesis. The left ventricular internal cavity size was normal in size. Right Ventricle: The right ventricular size is normal. No increase in right ventricular wall thickness. Right ventricular systolic function is severely reduced. Left Atrium: Left atrial size was severely dilated. No left atrial/left atrial appendage thrombus was detected. Right Atrium: Right atrial size was mildly dilated. Pericardium: There is no evidence of pericardial effusion. Mitral Valve: The mitral valve was not well visualized. Not well visualized mitral valve regurgitation. Tricuspid Valve: The tricuspid valve is normal in structure. Tricuspid valve regurgitation is trivial. Aortic Valve: The aortic valve is tricuspid. Aortic valve regurgitation is not visualized. No aortic stenosis is present. Pulmonic Valve: The pulmonic valve was not assessed. Pulmonic valve regurgitation not assessed. Aorta: Aortic root could not be assessed. IAS/Shunts: No atrial level shunt detected by color flow Doppler. Jules Oar MD Electronically signed by Jules Oar MD Signature Date/Time: 07/30/2023/1:54:05 PM    Final    ECHOCARDIOGRAM COMPLETE Result Date: 07/29/2023    ECHOCARDIOGRAM REPORT   Patient Name:   DORRIEN GRUNDER Date of Exam: 07/29/2023 Medical Rec #:  [id number]           Height:       71.0 in Accession #:    [id number]          Weight:       279.4 lb Date of Birth:  08-Jul-1965           BSA:          2.430 m Patient Age:    57 years            BP:           107/86 mmHg Patient Gender: M                   HR:           97 bpm. Exam Location:  [facility] Procedure: 2D Echo, Cardiac Doppler and Color Doppler (Both Spectral and Color            Flow Doppler were utilized during procedure). Indications:     Pulmonary embolus I26.09                  Syncope R55  History:         Patient has prior history of Echocardiogram examinations, most                  recent 01/19/2023. Risk Factors:Hypertension. Alcohol abuse,                  Paroxysmal Afib.  Sonographer:     Broadus Canes Referring Phys:  [id number] Avi Body Diagnosing  Phys: Antionette Kirks MD IMPRESSIONS  1. Left ventricular ejection fraction, by estimation, is 20 to 25%. The left ventricle has severely decreased function. The left ventricle demonstrates global hypokinesis. There is moderate left ventricular hypertrophy. Left ventricular diastolic parameters are indeterminate.  2. Right ventricular systolic function is normal. The right ventricular size is normal. Tricuspid regurgitation signal is inadequate for assessing PA pressure.  3. Left atrial size was moderately dilated.  4. The mitral valve is normal in structure. Mild mitral valve regurgitation. No evidence of mitral stenosis.  5. The aortic valve is normal in structure. Aortic valve regurgitation is not visualized. No aortic stenosis is present. FINDINGS  Left Ventricle: Left ventricular ejection fraction, by estimation, is 20 to 25%. The left ventricle has severely decreased function. The left ventricle demonstrates global hypokinesis. The  left ventricular internal cavity size was normal in size. There is moderate left ventricular hypertrophy. Left ventricular diastolic parameters are indeterminate. Right Ventricle: The right ventricular size is normal. No increase in right ventricular wall thickness. Right ventricular systolic function is normal. Tricuspid regurgitation signal is inadequate for assessing PA pressure. Left Atrium: Left atrial size was moderately dilated. Right Atrium: Right atrial size was normal in size. Pericardium: There is no evidence of pericardial effusion. Mitral Valve: The mitral valve is normal in structure. Mild mitral valve regurgitation. No evidence of mitral valve stenosis. Tricuspid Valve: The tricuspid valve is normal in structure. Tricuspid valve regurgitation is not demonstrated. No evidence of tricuspid stenosis. Aortic Valve: The aortic valve is normal in structure. Aortic valve regurgitation is not visualized. No aortic stenosis is present. Aortic valve mean gradient measures 2.0 mmHg. Aortic valve peak gradient measures 3.1 mmHg. Aortic valve area, by VTI measures 4.83 cm. Pulmonic Valve: The pulmonic valve was normal in structure. Pulmonic valve regurgitation is trivial. No evidence of pulmonic stenosis. Aorta: The aortic root is normal in size and structure. Venous: The inferior vena cava was not well visualized. IAS/Shunts: No atrial level shunt detected by color flow Doppler.  LEFT VENTRICLE PLAX 2D LVIDd:         5.20 cm LVIDs:         4.70 cm LV PW:         1.40 cm LV IVS:        1.70 cm LVOT diam:     2.30 cm LV SV:         51 LV SV Index:   21 LVOT Area:     4.15 cm  RIGHT VENTRICLE RV Basal diam:  4.30 cm RV Mid diam:    3.80 cm RV S prime:     8.59 cm/s TAPSE (M-mode): 1.5 cm LEFT ATRIUM           Index        RIGHT ATRIUM           Index LA diam:      5.10 cm 2.10 cm/m   RA Area:     18.60 cm LA Vol (A2C): 78.0 ml 32.09 ml/m  RA Volume:   51.10 ml  21.02 ml/m LA Vol (A4C): 63.7 ml 26.21 ml/m   AORTIC VALVE AV Area (Vmax):    3.57 cm AV Area (Vmean):   2.75 cm AV Area (VTI):     4.83 cm AV Vmax:           88.20 cm/s AV Vmean:          64.000 cm/s AV VTI:  0.105 m AV Peak Grad:      3.1 mmHg AV Mean Grad:      2.0 mmHg LVOT Vmax:         75.80 cm/s LVOT Vmean:        42.400 cm/s LVOT VTI:          0.122 m LVOT/AV VTI ratio: 1.16  AORTA Ao Root diam: 3.40 cm MITRAL VALVE               TRICUSPID VALVE MV Area (PHT): 3.13 cm    TR Peak grad:   16.2 mmHg MV Decel Time: 242 msec    TR Vmax:        201.00 cm/s MV E velocity: 96.40 cm/s                            SHUNTS                            Systemic VTI:  0.12 m                            Systemic Diam: 2.30 cm Antionette Kirks MD Electronically signed by Antionette Kirks MD Signature Date/Time: 07/29/2023/3:37:12 PM    Final    NM Pulmonary Perfusion Result Date: 07/28/2023 CLINICAL DATA:  [facility]-NMDeep venous thrombosis (DVT) EXAM: NUCLEAR MEDICINE PERFUSION LUNG SCAN TECHNIQUE: Perfusion images were obtained in multiple projections after intravenous injection of radiopharmaceutical. RADIOPHARMACEUTICALS:  4.3 mCi Tc-46m MAA COMPARISON:  Chest radiograph 07/28/2023 FINDINGS: Single wedge-shaped peripheral perfusion defect within the posterior lateral RIGHT lower lobe seen only on posterior projection. No additional defects identified. IMPRESSION: Concern for RIGHT lower lobe pulmonary embolism. Electronically Signed   By: Deboraha Fallow M.D.   On: 07/28/2023 16:31   CT Head Wo Contrast Result Date: 07/28/2023 CLINICAL DATA:  Fall, head injury EXAM: CT HEAD WITHOUT CONTRAST CT CERVICAL SPINE WITHOUT CONTRAST TECHNIQUE: Multidetector CT imaging of the head and cervical spine was performed following the standard protocol without intravenous contrast. Multiplanar CT image reconstructions of the cervical spine were also generated. RADIATION DOSE REDUCTION: This exam was performed according to the departmental dose-optimization program which includes  automated exposure control, adjustment of the mA and/or kV according to patient size and/or use of iterative reconstruction technique. COMPARISON:  None Available. FINDINGS: CT HEAD FINDINGS Brain: No evidence of acute infarction, hemorrhage, hydrocephalus, extra-axial collection or mass lesion/mass effect. Periventricular and deep white matter hypodensity. Vascular: No hyperdense vessel or unexpected calcification. Skull: Normal. Negative for fracture or focal lesion. Sinuses/Orbits: No acute finding. Other: Soft tissue laceration of the right frontal scalp (series 2, image 21). CT CERVICAL SPINE FINDINGS Alignment: Positional straightening and reversal of the normal cervical lordosis. Skull base and vertebrae: No acute fracture. No primary bone lesion or focal pathologic process. Soft tissues and spinal canal: No prevertebral fluid or swelling. No visible canal hematoma. Disc levels:  Intact. Upper chest: Negative. Other: None. IMPRESSION: 1. No acute intracranial pathology. 2. Extensive periventricular and deep white matter hypodensity, which may reflect age advanced small-vessel white matter disease or demyelinating disorder. 3. Soft tissue laceration of the right frontal scalp. 4. No fracture or static subluxation of the cervical spine. Electronically Signed   By: Fredricka Jenny M.D.   On: 07/28/2023 11:19   CT Cervical Spine Wo Contrast Result Date: 07/28/2023 CLINICAL DATA:  Fall, head  injury EXAM: CT HEAD WITHOUT CONTRAST CT CERVICAL SPINE WITHOUT CONTRAST TECHNIQUE: Multidetector CT imaging of the head and cervical spine was performed following the standard protocol without intravenous contrast. Multiplanar CT image reconstructions of the cervical spine were also generated. RADIATION DOSE REDUCTION: This exam was performed according to the departmental dose-optimization program which includes automated exposure control, adjustment of the mA and/or kV according to patient size and/or use of iterative  reconstruction technique. COMPARISON:  None Available. FINDINGS: CT HEAD FINDINGS Brain: No evidence of acute infarction, hemorrhage, hydrocephalus, extra-axial collection or mass lesion/mass effect. Periventricular and deep white matter hypodensity. Vascular: No hyperdense vessel or unexpected calcification. Skull: Normal. Negative for fracture or focal lesion. Sinuses/Orbits: No acute finding. Other: Soft tissue laceration of the right frontal scalp (series 2, image 21). CT CERVICAL SPINE FINDINGS Alignment: Positional straightening and reversal of the normal cervical lordosis. Skull base and vertebrae: No acute fracture. No primary bone lesion or focal pathologic process. Soft tissues and spinal canal: No prevertebral fluid or swelling. No visible canal hematoma. Disc levels:  Intact. Upper chest: Negative. Other: None. IMPRESSION: 1. No acute intracranial pathology. 2. Extensive periventricular and deep white matter hypodensity, which may reflect age advanced small-vessel white matter disease or demyelinating disorder. 3. Soft tissue laceration of the right frontal scalp. 4. No fracture or static subluxation of the cervical spine. Electronically Signed   By: Fredricka Jenny M.D.   On: 07/28/2023 11:19   DG Chest Port 1 View Result Date: 07/28/2023 CLINICAL DATA:  Shortness of breath EXAM: PORTABLE CHEST 1 VIEW COMPARISON:  June 21, 2023 FINDINGS: Bilateral reticular interstitial infiltrates correlate with congestive changes without consolidations. No pleural effusions Heart and mediastinum in the upper limits of normal with a left ventricular configuration IMPRESSION: Bilateral reticular interstitial infiltrates correlate with congestive changes without consolidations. Electronically Signed   By: Fredrich Jefferson M.D.   On: 07/28/2023 10:05    Microbiology: Recent Results (from the past 240 hours)  Resp panel by RT-PCR (RSV, Flu A&B, Covid) Anterior Nasal Swab     Status: None   Collection Time: 07/29/23   2:30 AM   Specimen: Anterior Nasal Swab  Result Value Ref Range Status   SARS Coronavirus 2 by RT PCR NEGATIVE NEGATIVE Final    Comment: (NOTE) SARS-CoV-2 target nucleic acids are NOT DETECTED.  The SARS-CoV-2 RNA is generally detectable in upper respiratory specimens during the acute phase of infection. The lowest concentration of SARS-CoV-2 viral copies this assay can detect is 138 copies/mL. A negative result does not preclude SARS-Cov-2 infection and should not be used as the sole basis for treatment or other patient management decisions. A negative result may occur with  improper specimen collection/handling, submission of specimen other than nasopharyngeal swab, presence of viral mutation(s) within the areas targeted by this assay, and inadequate number of viral copies(<138 copies/mL). A negative result must be combined with clinical observations, patient history, and epidemiological information. The expected result is Negative.  Fact Sheet for Patients:  BloggerCourse.com  Fact Sheet for Healthcare Providers:  SeriousBroker.it  This test is no t yet approved or cleared by the [place]  FDA and  has been authorized for detection and/or diagnosis of SARS-CoV-2 by FDA under an Emergency Use Authorization (EUA). This EUA will remain  in effect (meaning this test can be used) for the duration of the COVID-19 declaration under Section 564(b)(1) of the Act, 21 U.S.C.section 360bbb-3(b)(1), unless the authorization is terminated  or revoked sooner.  Influenza A by PCR NEGATIVE NEGATIVE Final   Influenza B by PCR NEGATIVE NEGATIVE Final    Comment: (NOTE) The Xpert Xpress SARS-CoV-2/FLU/RSV plus assay is intended as an aid in the diagnosis of influenza from Nasopharyngeal swab specimens and should not be used as a sole basis for treatment. Nasal washings and aspirates are unacceptable for Xpert Xpress  SARS-CoV-2/FLU/RSV testing.  Fact Sheet for Patients: BloggerCourse.com  Fact Sheet for Healthcare Providers: SeriousBroker.it  This test is not yet approved or cleared by the [place]  FDA and has been authorized for detection and/or diagnosis of SARS-CoV-2 by FDA under an Emergency Use Authorization (EUA). This EUA will remain in effect (meaning this test can be used) for the duration of the COVID-19 declaration under Section 564(b)(1) of the Act, 21 U.S.C. section 360bbb-3(b)(1), unless the authorization is terminated or revoked.     Resp Syncytial Virus by PCR NEGATIVE NEGATIVE Final    Comment: (NOTE) Fact Sheet for Patients: BloggerCourse.com  Fact Sheet for Healthcare Providers: SeriousBroker.it  This test is not yet approved or cleared by the [place]  FDA and has been authorized for detection and/or diagnosis of SARS-CoV-2 by FDA under an Emergency Use Authorization (EUA). This EUA will remain in effect (meaning this test can be used) for the duration of the COVID-19 declaration under Section 564(b)(1) of the Act, 21 U.S.C. section 360bbb-3(b)(1), unless the authorization is terminated or revoked.  Performed at [facility], [address] [place], [place] [address]   Respiratory (~20 pathogens) panel by PCR     Status: Abnormal   Collection Time: 07/29/23 10:21 AM  Result Value Ref Range Status   Adenovirus NOT DETECTED NOT DETECTED Final   Coronavirus 229E NOT DETECTED NOT DETECTED Final    Comment: (NOTE) The Coronavirus on the Respiratory Panel, DOES NOT test for the novel  Coronavirus (2019 nCoV)    Coronavirus HKU1 NOT DETECTED NOT DETECTED Final   Coronavirus NL63 NOT DETECTED NOT DETECTED Final   Coronavirus OC43 DETECTED (A) NOT DETECTED Final   Metapneumovirus NOT DETECTED NOT DETECTED Final   Rhinovirus / Enterovirus NOT DETECTED  NOT DETECTED Final   Influenza A NOT DETECTED NOT DETECTED Final   Influenza B NOT DETECTED NOT DETECTED Final   Parainfluenza Virus 1 NOT DETECTED NOT DETECTED Final   Parainfluenza Virus 2 NOT DETECTED NOT DETECTED Final   Parainfluenza Virus 3 NOT DETECTED NOT DETECTED Final   Parainfluenza Virus 4 NOT DETECTED NOT DETECTED Final   Respiratory Syncytial Virus NOT DETECTED NOT DETECTED Final   Bordetella pertussis NOT DETECTED NOT DETECTED Final   Bordetella Parapertussis NOT DETECTED NOT DETECTED Final   Chlamydophila pneumoniae NOT DETECTED NOT DETECTED Final   Mycoplasma pneumoniae NOT DETECTED NOT DETECTED Final    Comment: Performed at [facility], [address] [place], [place] [address]     Labs: Basic Metabolic Panel: Recent Labs  Lab 07/29/23 0409 07/30/23 0625 07/31/23 0552 08/01/23 0519 08/02/23 0438  NA 134* 135 132* 132* 133*  K 4.2 4.3 4.5 4.4 5.1  CL 104 105 101 101 102  CO2 21* 21* 20* 21* 23  GLUCOSE 203* 141* 164* 155* 216*  BUN 52* 70* 77* 79* 71*  CREATININE 3.93* 4.41* 4.87* 4.32* 4.09*  CALCIUM  8.3* 8.4* 8.3* 8.1* 8.3*  MG 2.0 2.3 2.4 2.5* 2.7*   Liver Function Tests: No results for input(s): "AST", "ALT", "ALKPHOS", "BILITOT", "PROT", "ALBUMIN " in the last 168 hours. No results for input(s): "LIPASE", "AMYLASE" in  the last 168 hours. No results for input(s): "AMMONIA" in the last 168 hours. CBC: Recent Labs  Lab 07/28/23 0940 07/29/23 0409 07/30/23 0625  WBC 8.5 6.6 9.8  NEUTROABS  --  5.5  --   HGB 12.9* 13.1 13.3  HCT 41.4 40.4 41.4  MCV 84.3 83.8 83.6  PLT 221 230 251   Cardiac Enzymes: No results for input(s): "CKTOTAL", "CKMB", "CKMBINDEX", "TROPONINI" in the last 168 hours. BNP: BNP (last 3 results) Recent Labs    06/21/23 0250 07/13/23 1549 07/28/23 0940  BNP 756.8* 531.8* 826.8*    ProBNP (last 3 results) No results for input(s): "PROBNP" in the last 8760 hours.  CBG: Recent Labs  Lab 07/28/23 1704  07/29/23 0822  GLUCAP 142* 104*       Signed:  Raymonde Calico MD.  [facility] 08/02/2023, 11:24 AM

## 2023-08-02 NOTE — Discharge Instructions (Signed)
 Follow up with PCP and pick up medications from pharmacy. Make sure to take all antibiotics. Do not double up on narcotic/pain medication or drink alcohol during this time. Do not drive while taking narcotic medication. Call 911 or return to ER for life threatening issues or other concerns.

## 2023-08-02 NOTE — Progress Notes (Signed)
 Nsg Discharge Note  Admit Date:  07/28/2023 Discharge date: 08/02/2023   Arnold Bicker to be D/C'd Home per MD order.  AVS completed.  Copy for chart, and copy for patient signed, and dated. Patient/caregiver able to verbalize understanding.  Discharge Medication: Allergies as of 08/02/2023       Reactions   Esomeprazole Magnesium  Other (See Comments)   Suspected interstitial nephritis 2018   Egg-derived Products Rash        Medication List     PAUSE taking these medications    torsemide  20 MG tablet Wait to take this until your doctor or other care provider tells you to start again. Commonly known as: DEMADEX  Take 2 tablets (40 mg total) by mouth daily.       STOP taking these medications    glipiZIDE 5 MG 24 hr tablet Commonly known as: GLUCOTROL XL       TAKE these medications    allopurinol  100 MG tablet Commonly known as: ZYLOPRIM  Take 100 mg by mouth daily.   amiodarone  200 MG tablet Commonly known as: PACERONE  Take 1 tablet (200 mg total) by mouth daily.   apixaban  5 MG Tabs tablet Commonly known as: ELIQUIS  Take 2 tabs twice a day until 6/14, then take 1 tab twice a day What changed:  how much to take how to take this when to take this additional instructions   atorvastatin  80 MG tablet Commonly known as: LIPITOR  Take 1 tablet (80 mg total) by mouth daily.   calcitRIOL 0.25 MCG capsule Commonly known as: ROCALTROL Take 0.25 mcg by mouth 3 (three) times a week.   empagliflozin  10 MG Tabs tablet Commonly known as: Jardiance  Take 1 tablet (10 mg total) by mouth daily.   ferrous sulfate  325 (65 FE) MG EC tablet Take 325 mg by mouth daily with breakfast.   gabapentin  100 MG capsule Commonly known as: NEURONTIN  Take 1 capsule (100 mg total) by mouth 2 (two) times daily.   isosorbide -hydrALAZINE  20-37.5 MG tablet Commonly known as: BIDIL  Take 1 tablet by mouth 3 (three) times daily.   levothyroxine  125 MCG tablet Commonly known as:  SYNTHROID  Take 125 mcg by mouth daily before breakfast.   lipase/protease/amylase 81191 UNITS Cpep capsule Commonly known as: CREON  Take 1 capsule (36,000 Units total) by mouth 3 (three) times daily before meals.   polyethylene glycol 17 g packet Commonly known as: MIRALAX  / GLYCOLAX  Take 17 g by mouth daily as needed for mild constipation.        Discharge Assessment: Vitals:   08/02/23 0514 08/02/23 0821  BP: (!) 146/109 (!) 148/107  Pulse: (!) 57 62  Resp:  20  Temp:  97.9 F (36.6 C)  SpO2:  98%   IV catheter discontinued intact. Site without signs and symptoms of complications - no redness or edema noted at insertion site, patient denies c/o pain - only slight tenderness at site.  Dressing with slight pressure applied.  D/c Instructions-Education: Discharge instructions given to patient/family with verbalized understanding. D/c education completed with patient/family including follow up instructions, medication list, d/c activities limitations if indicated, with other d/c instructions as indicated by MD - patient able to verbalize understanding, all questions fully answered. Patient instructed to return to ED, call 911, or call MD for any changes in condition.  Patient escorted via WC, and D/C home via private auto.  Tamara Fairy, RN 08/02/2023 12:25 PM

## 2023-08-03 ENCOUNTER — Encounter: Admitting: Family

## 2023-08-05 ENCOUNTER — Telehealth: Payer: Self-pay | Admitting: Family

## 2023-08-05 NOTE — Telephone Encounter (Signed)
 Called to confirm/remind patient of their appointment at the [facility] on 08/06/23.   Appointment:   [] Confirmed  [x] Left mess   [] No answer/No voice mail  [] VM Full/unable to leave message  [] Phone not in service  Patient reminded to bring all medications and/or complete list.  Confirmed patient has transportation. Gave directions, instructed to utilize valet parking.

## 2023-08-06 ENCOUNTER — Other Ambulatory Visit (HOSPITAL_COMMUNITY): Payer: Self-pay | Admitting: Cardiology

## 2023-08-06 ENCOUNTER — Inpatient Hospital Stay (HOSPITAL_COMMUNITY)
Admission: RE | Admit: 2023-08-06 | Discharge: 2023-08-06 | Disposition: A | Discharge status: Home / Self Care | Source: Ambulatory Visit

## 2023-08-06 ENCOUNTER — Ambulatory Visit (HOSPITAL_BASED_OUTPATIENT_CLINIC_OR_DEPARTMENT_OTHER): Admitting: Internal Medicine

## 2023-08-06 ENCOUNTER — Other Ambulatory Visit
Admission: RE | Admit: 2023-08-06 | Discharge: 2023-08-06 | Disposition: A | Discharge status: Home / Self Care | Source: Ambulatory Visit | Attending: Family | Admitting: Family

## 2023-08-06 VITALS — BP 123/85 | HR 68 | Wt 287.2 lb

## 2023-08-06 DIAGNOSIS — M7989 Other specified soft tissue disorders: Secondary | ICD-10-CM | POA: Insufficient documentation

## 2023-08-06 DIAGNOSIS — I5042 Chronic combined systolic (congestive) and diastolic (congestive) heart failure: Secondary | ICD-10-CM

## 2023-08-06 DIAGNOSIS — I480 Paroxysmal atrial fibrillation: Secondary | ICD-10-CM | POA: Insufficient documentation

## 2023-08-06 DIAGNOSIS — I2510 Atherosclerotic heart disease of native coronary artery without angina pectoris: Secondary | ICD-10-CM | POA: Diagnosis not present

## 2023-08-06 DIAGNOSIS — E1122 Type 2 diabetes mellitus with diabetic chronic kidney disease: Secondary | ICD-10-CM | POA: Insufficient documentation

## 2023-08-06 DIAGNOSIS — R55 Syncope and collapse: Secondary | ICD-10-CM

## 2023-08-06 DIAGNOSIS — N184 Chronic kidney disease, stage 4 (severe): Secondary | ICD-10-CM | POA: Diagnosis not present

## 2023-08-06 DIAGNOSIS — I4892 Unspecified atrial flutter: Secondary | ICD-10-CM | POA: Insufficient documentation

## 2023-08-06 DIAGNOSIS — R0602 Shortness of breath: Secondary | ICD-10-CM | POA: Diagnosis present

## 2023-08-06 DIAGNOSIS — Z79899 Other long term (current) drug therapy: Secondary | ICD-10-CM | POA: Insufficient documentation

## 2023-08-06 DIAGNOSIS — Z7984 Long term (current) use of oral hypoglycemic drugs: Secondary | ICD-10-CM | POA: Diagnosis not present

## 2023-08-06 DIAGNOSIS — I428 Other cardiomyopathies: Secondary | ICD-10-CM | POA: Diagnosis not present

## 2023-08-06 DIAGNOSIS — I130 Hypertensive heart and chronic kidney disease with heart failure and stage 1 through stage 4 chronic kidney disease, or unspecified chronic kidney disease: Secondary | ICD-10-CM | POA: Insufficient documentation

## 2023-08-06 DIAGNOSIS — Z7901 Long term (current) use of anticoagulants: Secondary | ICD-10-CM | POA: Diagnosis not present

## 2023-08-06 DIAGNOSIS — R002 Palpitations: Secondary | ICD-10-CM | POA: Diagnosis not present

## 2023-08-06 DIAGNOSIS — Z87891 Personal history of nicotine dependence: Secondary | ICD-10-CM | POA: Diagnosis not present

## 2023-08-06 DIAGNOSIS — I447 Left bundle-branch block, unspecified: Secondary | ICD-10-CM | POA: Insufficient documentation

## 2023-08-06 DIAGNOSIS — G4733 Obstructive sleep apnea (adult) (pediatric): Secondary | ICD-10-CM | POA: Insufficient documentation

## 2023-08-06 LAB — BASIC METABOLIC PANEL WITH GFR
Anion gap: 12 (ref 5–15)
BUN: 46 mg/dL — ABNORMAL HIGH (ref 6–20)
CO2: 24 mmol/L (ref 22–32)
Calcium: 8.5 mg/dL — ABNORMAL LOW (ref 8.9–10.3)
Chloride: 103 mmol/L (ref 98–111)
Creatinine, Ser: 3.74 mg/dL — ABNORMAL HIGH (ref 0.61–1.24)
GFR, Estimated: 18 mL/min — ABNORMAL LOW (ref >60)
Glucose, Bld: 74 mg/dL (ref 70–99)
Potassium: 4.1 mmol/L (ref 3.5–5.1)
Sodium: 139 mmol/L (ref 135–145)

## 2023-08-06 LAB — BRAIN NATRIURETIC PEPTIDE: B Natriuretic Peptide: 715.7 pg/mL — ABNORMAL HIGH (ref 0.0–100.0)

## 2023-08-06 NOTE — Patient Instructions (Addendum)
 Medication Changes:  TAKE 1 DOSE OF METOLAZONE  2.5 MG   Labs:  Go over to the [facility]. Go pass the gift shop and have your blood work completed.  We will only call you if the results are abnormal or if the provider would like to make medication changes.    Testing/Procedures:  Your provider has recommended that  you wear a Zio Patch for 14 days. This monitor will record your heart rhythm for our review.  IF you have any symptoms while wearing the monitor please press the button. If you have any issues with the patch or you notice a red or orange light on it please call the company at [phone].  Once you remove the patch please mail it back to the company as soon as possible so we can get the results.   Special Instructions // Education:  FOLLOW UP WITH YOUR KIDNEY DOCTOR. PLEASE CALL THEM TO GET SCHEDULED.  WEAR YOUR COMPRESSION SOCKS DAILY AND REMOVE THEM AT BEDTIME.   Follow-Up in: 2 WEEKS WITH Shawnee Dellen, FNP.  At the [facility], you and your health needs are our priority. We have a designated team specialized in the treatment of Heart Failure. This Care Team includes your primary Heart Failure Specialized Cardiologist (physician), Advanced Practice Providers (APPs- Physician Assistants and Nurse Practitioners), and Pharmacist who all work together to provide you with the care you need, when you need it.   You may see any of the following providers on your designated Care Team at your next follow up:  Dr. Jules Oar Dr. Peder Bourdon Dr. Alwin Baars Dr. Judyth Nunnery Shawnee Dellen, FNP Bevely Brush, RPH-CPP  Please be sure to bring in all your medications bottles to every appointment.   Need to Contact Us :  If you have any questions or concerns before your next appointment please send us  a message through [facility] or call our office at [phone].    TO LEAVE A MESSAGE FOR THE NURSE SELECT OPTION 2, PLEASE LEAVE A MESSAGE  INCLUDING: YOUR NAME DATE OF BIRTH CALL BACK NUMBER REASON FOR CALL**this is important as we prioritize the call backs  YOU WILL RECEIVE A CALL BACK THE SAME DAY AS LONG AS YOU CALL BEFORE 4:00 PM

## 2023-08-06 NOTE — Progress Notes (Signed)
 [facility] NOTE   Primary Care: Dionicia Frater, MD Primary Cardiologist: Belva Boyden, MD  HP provider: Jules Oar, MD   HPI:  Hector [name] is a 58 y.o. male with ETOH use, DM2, morbid obesity, PAF (h/o GIB on apixaban ),  OSA on CPAP, CKD stage IV (Scr ~3.3), medication noncompliance.    HF dates back to at least 2009, thought to presumably be nonischemic in the setting of alcohol use, with an EF of 20%.     Echo 2016 EF 20-25%. LHC 12/2014 minimal CAD   EF initially improved with restoration of NSR but then dropped back down    He underwent TEE/DCCV in 02/2021 at that time showed an EF of less than 20% with severely reduced RV systolic function.  He was reevaluated by EP in 04/2021 and felt not to be a good candidate for ICD or ablation given medication noncompliance.   Admitted in 9/23 & 10/23 with ADHF. Limited echo 10/23 EF <  20% with moderate RV dysfunction and moderate to sever MR.  He was discharged from the hospital on 12/12/2021 with a weight of 247 pounds.     Admitted 11/23, 12/23, 1/24 and 3/24 with recurrent HF.   In 3/24 underwent TEE-guided DCCV as well as RHC. TEE EF < 20%, mod RV HK, mod MR. RHC with markedly elevated filling pressures and low CO. RA 17, PA 58/39 (43), PCWP 33, Fick 3.4/1.5 (PA sat 49%), PAPi 1.2.   Seen in ER 05/18/22 with recurrent volume overload. Lasix  switched to torsemide  and metolazone  added.  Admitted 01/16/23 due to abdominal pain and found to be tachycardic. CT scan of the abdomen without contrast did not find any findings in the abdomen or pelvis to explain the patient's abdominal pain, cholelithiasis, renal cysts. Worsening renal function so diuretic held and IVF given. Cardiology and nephrology consults done. Was in atrial flutter with successful cardioversion. Given amiodarone .   Echo 01/19/23: EF <20% with mild MR, no thrombus  Admitted 6/25 with syncope. Found to be in a fib RVR. VQ + for possible RLL PE. Resp  panel + for coronavirus OC43. Echo 07/29/23 EF 20-25% RV ok. Loaded with IV amiodarone . Refused lifevest, no driving for 6 months. Cardioverted to NSR. SCr >4 at discharge. GDMT limited with renal function and hyperkalemia. Was supposed to discharge with a Zio but failed to get it placed prior to d/c.   Today he returns for post hospital follow up. Overall feeling ok. Denies CP, dizziness, or PND/Orthopnea. Reports swelling in his legs. Feels SOB with exertion. Feels palpitations every now and then. Appetite ok, tries to watch what he eats but does reach for the salt shaker every now and then. No fever or chills. Does not weight at home. Taking all medications. Denies ETOH, tobacco or drug use.   Past Medical History:  Diagnosis Date   Alcohol abuse    Alcoholic cardiomyopathy (HCC)    a. 12/2007 MV: EF 28%, no isch;  b. 8/12 Echo: EF 25-35%; c. 02/2014 Echo: EF 20-25%; d. 12/2014 Cath: minimal CAD; e. 01/2015 Echo: EF 50-55%;  d. 05/2016 Echo: EF 30-35%, diff HK, gr2 DD; e. 11/2016 Echo: EF 45-50%, diff HK; f. 09/2021 Echo: EF <20%; g. 11/2021 Echo: EF<20%.   Chronic combined systolic (congestive) and diastolic (congestive) heart failure (HCC)    a. 05/2016 Echo: EF 30-35%; b. 11/2016 Echo: EF 45-50%, diff HK; c. 09/2021 Echo: EF < 20%; d. 11/2021 Echo: EF <20%, no rwma, Nl RV  fxn, sev dil LA, mod-sev MR. mod dil PA w/ mildly elev PASP.   CKD (chronic kidney disease), stage III (HCC)    Elevated troponin (chronic)    Essential hypertension    GI bleed 11/2013   Hyperlipidemia    Pancreatitis    Paroxysmal A-fib (HCC)    a. new onset s/p unsuccessful TEE/DCCV on 08/16/2014; b. CHA2DS2VASc = 3-> eliquis  (freq noncompliant); c. 02/2021 s/p TEE/DCCV; d. Prev on amio->d/c 2/2 abnl TFTs.   Paroxysmal atrial flutter (HCC)    Sleep-disordered breathing    Has yet to have a sleep study   Stroke [facility])     Current Outpatient Medications  Medication Sig Dispense Refill   allopurinol  (ZYLOPRIM ) 100 MG tablet  Take 100 mg by mouth daily.     amiodarone  (PACERONE ) 200 MG tablet Take 1 tablet (200 mg total) by mouth daily. 90 tablet 1   apixaban  (ELIQUIS ) 5 MG TABS tablet Take 2 tabs twice a day until 6/14, then take 1 tab twice a day 180 tablet 0   atorvastatin  (LIPITOR ) 80 MG tablet Take 1 tablet (80 mg total) by mouth daily. 90 tablet 1   calcitRIOL (ROCALTROL) 0.25 MCG capsule Take 0.25 mcg by mouth 3 (three) times a week.     empagliflozin  (JARDIANCE ) 10 MG TABS tablet Take 1 tablet (10 mg total) by mouth daily. 90 tablet 5   gabapentin  (NEURONTIN ) 100 MG capsule Take 1 capsule (100 mg total) by mouth 2 (two) times daily. 60 capsule 0   isosorbide -hydrALAZINE  (BIDIL ) 20-37.5 MG tablet Take 1 tablet by mouth 3 (three) times daily. 90 tablet 0   levothyroxine  (SYNTHROID ) 125 MCG tablet Take 125 mcg by mouth daily before breakfast.     lipase/protease/amylase (CREON ) 36000 UNITS CPEP capsule Take 1 capsule (36,000 Units total) by mouth 3 (three) times daily before meals. 180 capsule 0   metolazone  (ZAROXOLYN ) 2.5 MG tablet Take 2.5 mg by mouth once a week. On mondays     polyethylene glycol (MIRALAX  / GLYCOLAX ) 17 g packet Take 17 g by mouth daily as needed for mild constipation. 30 each 0   [Paused] torsemide  (DEMADEX ) 20 MG tablet Take 2 tablets (40 mg total) by mouth daily. (Patient not taking: Reported on 08/06/2023) 60 tablet 0   No current facility-administered medications for this visit.   Allergies  Allergen Reactions   Esomeprazole Magnesium  Other (See Comments)    Suspected interstitial nephritis 2018   Egg-Derived Products Rash   Social History   Socioeconomic History   Marital status: Single    Spouse name: Not on file   Number of children: Not on file   Years of education: Not on file   Highest education level: Not on file  Occupational History   Not on file  Tobacco Use   Smoking status: Former    Current packs/day: 0.00    Average packs/day: 1 pack/day for 12.0 years (12.0  ttl pk-yrs)    Types: Cigarettes    Start date: 02/23/1986    Quit date: 02/23/1998    Years since quitting: 25.4   Smokeless tobacco: Never  Vaping Use   Vaping status: Never Used  Substance and Sexual Activity   Alcohol use: Not Currently    Alcohol/week: 0.0 standard drinks of alcohol    Comment: Past heavy drinker   Drug use: No   Sexual activity: Not Currently  Other Topics Concern   Not on file  Social History Narrative   Not on file  Social Drivers of Corporate investment banker Strain: Low Risk  (01/19/2023)   Overall Financial Resource Strain (CARDIA)    Difficulty of Paying Living Expenses: Not hard at all  Food Insecurity: No Food Insecurity (07/30/2023)   Hunger Vital Sign    Worried About Running Out of Food in the Last Year: Never true    Ran Out of Food in the Last Year: Never true  Transportation Needs: No Transportation Needs (07/30/2023)   PRAPARE - Administrator, Civil Service (Medical): No    Lack of Transportation (Non-Medical): No  Physical Activity: Inactive (01/04/2019)   Exercise Vital Sign    Days of Exercise per Week: 0 days    Minutes of Exercise per Session: 0 min  Stress: No Stress Concern Present (01/04/2019)   [employer] of Occupational Health - Occupational Stress Questionnaire    Feeling of Stress : Not at all  Social Connections: Moderately Isolated (01/04/2019)   Social Connection and Isolation Panel    Frequency of Communication with Friends and Family: More than three times a week    Frequency of Social Gatherings with Friends and Family: More than three times a week    Attends Religious Services: 1 to 4 times per year    Active Member of [employer] or Organizations: No    Attends Banker Meetings: Never    Marital Status: Never married  Intimate Partner Violence: Not At Risk (07/30/2023)   Humiliation, Afraid, Rape, and Kick questionnaire    Fear of Current or Ex-Partner: No    Emotionally Abused: No     Physically Abused: No    Sexually Abused: No   Family History  Problem Relation Age of Onset   Hypertension Mother    Hyperlipidemia Mother    Diabetes Mother    Vitals:   08/06/23 1431  BP: 123/85  Pulse: 68  SpO2: 98%  Weight: 287 lb 3.2 oz (130.3 kg)    Wt Readings from Last 3 Encounters:  08/06/23 287 lb 3.2 oz (130.3 kg)  08/02/23 290 lb 2 oz (131.6 kg)  07/13/23 296 lb (134.3 kg)   Lab Results  Component Value Date   CREATININE 4.09 (H) 08/02/2023   CREATININE 4.32 (H) 08/01/2023   CREATININE 4.87 (H) 07/31/2023   PHYSICAL EXAM: General:  tired appearing.  No respiratory difficulty. Walked into clinic Neck: supple. JVD ~9 cm.  Cor: PMI nondisplaced. Regular rate & rhythm. No rubs, gallops or murmurs. Lungs: clear, diminished bases Extremities: no cyanosis, clubbing, rash, +2 BLE edema  Neuro: alert & oriented x 3. Moves all 4 extremities w/o difficulty. Affect pleasant.   ECG: NSR 67, LBBB bpm (Personally reviewed)    ASSESSMENT & PLAN: 1. 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 Not interested in Palliative Care referral - Scr up with diuresis. Remains volume overloaded. Continue Torsemide  40 mg daily. Will have him take an extra dose of metolazone  today.  - Continue metolazone  2.5 mg every Monday.  - Continue BiDil  1 tablet TID - Continue Jardiance  10 mg  daily - GDMT limited by CKD IV. Restart as able - BMET/BNP today. Recent K elevated. Will not have him take more with metolazone  dose with poor renal function.   2. Paroxysmal atrial  fibrillation/atrial flutter- - continue apixaban  5 mg twice daily for CHA2DS2-VASc score of at least 6. Denies abnormal bleeding - continue amio 200mg  daily  - TEE/DC-CV 3/24  - DC-CV 11/24 - Emergent DCCV 6/25 - Previously seen by EP -> not candidate for ablation - EKG NSR   CKD stage IV- - Baseline Scr 2.8-3.4. recently up to 4 following last admission.  - BMET today - Avoid nephrotoxic agents where able - Not HD candidate with severe HF - Needs to follow up with nephrology. Missed his last appt as he was in the hospital.    4. Nonobstructive coronary artery disease- - Cath 2016 - saw cardiology (Dunn) 04/24 - continue atorvastatin  80mg  daily - Last LDL 88. Update at f/u   5. Obstructive sleep apnea- - last sleep study done 12/2015 - reports that he started wearing his CPAP again   6. Type 2 diabetes- -A1c 7.7% on 01/17/23 -Continue SGLT2i -Per PCP   7. Recent Syncopal episode x 2 - suspect was related to AFL but also at high-risk for VT. VQ + for PE - Continue amiodarone  200 mg daily - no driving x 6 months - has not been ICD candidate due to poor compliance - Refuses LifeVest - Remains in NSR today - Place 2 wk zio today.    Follow up in 2 weeks with APP to follow up on fluid.   Hector [name] AGACNP-BC  08/06/23

## 2023-08-09 ENCOUNTER — Ambulatory Visit (HOSPITAL_COMMUNITY): Payer: Self-pay | Admitting: Internal Medicine

## 2023-08-26 ENCOUNTER — Encounter: Admitting: Family

## 2023-09-01 ENCOUNTER — Telehealth: Payer: Self-pay | Admitting: Family

## 2023-09-01 NOTE — Telephone Encounter (Signed)
 Called to confirm/remind patient of their appointment at the [facility] on 09/02/23.   Appointment:   [] Confirmed  [x] Left mess   [] No answer/No voice mail  [] VM Full/unable to leave message  [] Phone not in service  Patient reminded to bring all medications and/or complete list.  Confirmed patient has transportation. Gave directions, instructed to utilize valet parking.

## 2023-09-02 ENCOUNTER — Ambulatory Visit: Attending: Family | Admitting: Family

## 2023-09-02 ENCOUNTER — Encounter: Payer: Self-pay | Admitting: Family

## 2023-09-02 ENCOUNTER — Other Ambulatory Visit: Payer: Self-pay | Admitting: Obstetrics and Gynecology

## 2023-09-02 VITALS — BP 139/101 | HR 71 | Wt 276.8 lb

## 2023-09-02 DIAGNOSIS — I2510 Atherosclerotic heart disease of native coronary artery without angina pectoris: Secondary | ICD-10-CM | POA: Insufficient documentation

## 2023-09-02 DIAGNOSIS — I5042 Chronic combined systolic (congestive) and diastolic (congestive) heart failure: Secondary | ICD-10-CM | POA: Diagnosis not present

## 2023-09-02 DIAGNOSIS — I4891 Unspecified atrial fibrillation: Secondary | ICD-10-CM | POA: Insufficient documentation

## 2023-09-02 DIAGNOSIS — G4733 Obstructive sleep apnea (adult) (pediatric): Secondary | ICD-10-CM | POA: Insufficient documentation

## 2023-09-02 DIAGNOSIS — R55 Syncope and collapse: Secondary | ICD-10-CM | POA: Diagnosis present

## 2023-09-02 DIAGNOSIS — Z7901 Long term (current) use of anticoagulants: Secondary | ICD-10-CM | POA: Insufficient documentation

## 2023-09-02 DIAGNOSIS — N184 Chronic kidney disease, stage 4 (severe): Secondary | ICD-10-CM | POA: Diagnosis not present

## 2023-09-02 DIAGNOSIS — R0602 Shortness of breath: Secondary | ICD-10-CM | POA: Diagnosis present

## 2023-09-02 DIAGNOSIS — I130 Hypertensive heart and chronic kidney disease with heart failure and stage 1 through stage 4 chronic kidney disease, or unspecified chronic kidney disease: Secondary | ICD-10-CM | POA: Insufficient documentation

## 2023-09-02 DIAGNOSIS — F1090 Alcohol use, unspecified, uncomplicated: Secondary | ICD-10-CM | POA: Diagnosis not present

## 2023-09-02 DIAGNOSIS — Z7984 Long term (current) use of oral hypoglycemic drugs: Secondary | ICD-10-CM | POA: Insufficient documentation

## 2023-09-02 DIAGNOSIS — I428 Other cardiomyopathies: Secondary | ICD-10-CM | POA: Insufficient documentation

## 2023-09-02 DIAGNOSIS — Z87891 Personal history of nicotine dependence: Secondary | ICD-10-CM | POA: Insufficient documentation

## 2023-09-02 DIAGNOSIS — E1122 Type 2 diabetes mellitus with diabetic chronic kidney disease: Secondary | ICD-10-CM | POA: Insufficient documentation

## 2023-09-02 DIAGNOSIS — I480 Paroxysmal atrial fibrillation: Secondary | ICD-10-CM | POA: Insufficient documentation

## 2023-09-02 MED ORDER — APIXABAN 5 MG PO TABS: ORAL_TABLET | ORAL | 3 refills | Status: DC

## 2023-09-02 MED ORDER — ATORVASTATIN CALCIUM 80 MG PO TABS: 80.0000 mg | ORAL_TABLET | Freq: Every day | ORAL | 1 refills | Status: AC

## 2023-09-02 MED ORDER — EMPAGLIFLOZIN 10 MG PO TABS: 10.0000 mg | ORAL_TABLET | Freq: Every day | ORAL | 5 refills | Status: DC

## 2023-09-02 MED ORDER — METOLAZONE 2.5 MG PO TABS: 2.5000 mg | ORAL_TABLET | ORAL | 2 refills | Status: DC

## 2023-09-02 MED ORDER — AMIODARONE HCL 200 MG PO TABS: 200.0000 mg | ORAL_TABLET | Freq: Every day | ORAL | 1 refills | Status: AC

## 2023-09-02 NOTE — Progress Notes (Signed)
 ADVANCED HF CLINIC NOTE   Primary Care: [name], MD Primary Cardiologist: Evalene Lunger, MD  HP provider: Cherrie Sieving, MD   Chief Complaint: shortness of breath   HPI:   [name] is a 58 y.o. male with ETOH use, DM2, morbid obesity, PAF (h/o GIB on apixaban ),  OSA on CPAP, CKD stage IV (Scr ~3.3), medication noncompliance.    HF dates back to at least 2009, thought to presumably be nonischemic in the setting of alcohol use, with an EF of 20%.     Echo 2016 EF 20-25%. LHC 12/2014 minimal CAD   EF initially improved with restoration of NSR but then dropped back down    He underwent TEE/DCCV in 02/2021 at that time showed an EF of less than 20% with severely reduced RV systolic function.  He was reevaluated by EP in 04/2021 and felt not to be a good candidate for ICD or ablation given medication noncompliance.   Admitted in 9/23 & 10/23 with ADHF. Limited echo 10/23 EF <  20% with moderate RV dysfunction and moderate to sever MR.  He was discharged from the hospital on 12/12/2021 with a weight of 247 pounds.     Admitted 11/23, 12/23, 1/24 and 3/24 with recurrent HF.   In 3/24 underwent TEE-guided DCCV as well as RHC. TEE EF < 20%, mod RV HK, mod MR. RHC with markedly elevated filling pressures and low CO. RA 17, PA 58/39 (43), PCWP 33, Fick 3.4/1.5 (PA sat 49%), PAPi 1.2.   Seen in ER 05/18/22 with recurrent volume overload. Lasix  switched to torsemide  and metolazone  added.  Admitted 01/16/23 due to abdominal pain and found to be tachycardic. CT scan of the abdomen without contrast did not find any findings in the abdomen or pelvis to explain the patient's abdominal pain, cholelithiasis, renal cysts. Worsening renal function so diuretic held and IVF given. Cardiology and nephrology consults done. Was in atrial flutter with successful cardioversion. Given amiodarone .   Echo 01/19/23: EF <20% with mild MR, no thrombus  Admitted 07/28/23 with syncope. Found to be  in a fib RVR. VQ + for possible RLL PE. Resp panel + for coronavirus OC43. Echo 07/29/23 EF 20-25% RV ok. Loaded with IV amiodarone . Refused lifevest, no driving for 6 months. Cardioverted to NSR. SCr >4 at discharge. GDMT limited with renal function and hyperkalemia. Was supposed to discharge with a Zio but failed to get it placed prior to d/c.   Seen in [facility] 06/25 and was given an extra 2.5mg  metolazone  due to edema. 2 week zio placed. Patient refused lifevest.   He presents today for a HF follow-up visit with a chief complaint of shortness of breath. Has associated fatigue, occasional palpitations. Requesting refills on all his HF meds but says that he hasn't run out of anything yet. Has upcoming nephrology appointment later this month & has lab work next week before his nephrology appointment. Checks his BP at home and says that at home ir runs 120's-130's. Would like zio results.   ROS: All systems negative except what is listed in HPI, PMH and Problem List   Past Medical History:  Diagnosis Date   Alcohol abuse    Alcoholic cardiomyopathy (HCC)    a. 12/2007 MV: EF 28%, no isch;  b. 8/12 Echo: EF 25-35%; c. 02/2014 Echo: EF 20-25%; d. 12/2014 Cath: minimal CAD; e. 01/2015 Echo: EF 50-55%;  d. 05/2016 Echo: EF 30-35%, diff HK, gr2 DD; e. 11/2016 Echo: EF 45-50%, diff HK;  f. 09/2021 Echo: EF <20%; g. 11/2021 Echo: EF<20%.   Chronic combined systolic (congestive) and diastolic (congestive) heart failure (HCC)    a. 05/2016 Echo: EF 30-35%; b. 11/2016 Echo: EF 45-50%, diff HK; c. 09/2021 Echo: EF < 20%; d. 11/2021 Echo: EF <20%, no rwma, Nl RV fxn, sev dil LA, mod-sev MR. mod dil PA w/ mildly elev PASP.   CKD (chronic kidney disease), stage III (HCC)    Elevated troponin (chronic)    Essential hypertension    GI bleed 11/2013   Hyperlipidemia    Pancreatitis    Paroxysmal A-fib (HCC)    a. new onset s/p unsuccessful TEE/DCCV on 08/16/2014; b. CHA2DS2VASc = 3-> eliquis  (freq noncompliant); c. 02/2021  s/p TEE/DCCV; d. Prev on amio->d/c 2/2 abnl TFTs.   Paroxysmal atrial flutter (HCC)    Sleep-disordered breathing    Has yet to have a sleep study   Stroke [facility])     Current Outpatient Medications  Medication Sig Dispense Refill   allopurinol  (ZYLOPRIM ) 100 MG tablet Take 100 mg by mouth daily.     amiodarone  (PACERONE ) 200 MG tablet Take 1 tablet (200 mg total) by mouth daily. 90 tablet 1   apixaban  (ELIQUIS ) 5 MG TABS tablet Take 2 tabs twice a day until 6/14, then take 1 tab twice a day 180 tablet 0   atorvastatin  (LIPITOR ) 80 MG tablet Take 1 tablet (80 mg total) by mouth daily. 90 tablet 1   calcitRIOL (ROCALTROL) 0.25 MCG capsule Take 0.25 mcg by mouth 3 (three) times a week.     empagliflozin  (JARDIANCE ) 10 MG TABS tablet Take 1 tablet (10 mg total) by mouth daily. 90 tablet 5   gabapentin  (NEURONTIN ) 100 MG capsule Take 1 capsule (100 mg total) by mouth 2 (two) times daily. 60 capsule 0   levothyroxine  (SYNTHROID ) 125 MCG tablet Take 125 mcg by mouth daily before breakfast.     lipase/protease/amylase (CREON ) 36000 UNITS CPEP capsule Take 1 capsule (36,000 Units total) by mouth 3 (three) times daily before meals. 180 capsule 0   metolazone  (ZAROXOLYN ) 2.5 MG tablet Take 2.5 mg by mouth once a week. On mondays     polyethylene glycol (MIRALAX  / GLYCOLAX ) 17 g packet Take 17 g by mouth daily as needed for mild constipation. 30 each 0   [Paused] torsemide  (DEMADEX ) 20 MG tablet Take 2 tablets (40 mg total) by mouth daily. (Patient not taking: Reported on 08/06/2023) 60 tablet 0   No current facility-administered medications for this visit.   Allergies  Allergen Reactions   Esomeprazole Magnesium  Other (See Comments)    Suspected interstitial nephritis 2018   Egg-Derived Products Rash   Social History   Socioeconomic History   Marital status: Single    Spouse name: Not on file   Number of children: Not on file   Years of education: Not on file   Highest education level: Not on  file  Occupational History   Not on file  Tobacco Use   Smoking status: Former    Current packs/day: 0.00    Average packs/day: 1 pack/day for 12.0 years (12.0 ttl pk-yrs)    Types: Cigarettes    Start date: 02/23/1986    Quit date: 02/23/1998    Years since quitting: 25.5   Smokeless tobacco: Never  Vaping Use   Vaping status: Never Used  Substance and Sexual Activity   Alcohol use: Not Currently    Alcohol/week: 0.0 standard drinks of alcohol    Comment: Past heavy  drinker   Drug use: No   Sexual activity: Not Currently  Other Topics Concern   Not on file  Social History Narrative   Not on file   Social Drivers of Health   Financial Resource Strain: Low Risk  (01/19/2023)   Overall Financial Resource Strain (CARDIA)    Difficulty of Paying Living Expenses: Not hard at all  Food Insecurity: No Food Insecurity (07/30/2023)   Hunger Vital Sign    Worried About Running Out of Food in the Last Year: Never true    Ran Out of Food in the Last Year: Never true  Transportation Needs: No Transportation Needs (07/30/2023)   PRAPARE - Administrator, Civil Service (Medical): No    Lack of Transportation (Non-Medical): No  Physical Activity: Inactive (01/04/2019)   Exercise Vital Sign    Days of Exercise per Week: 0 days    Minutes of Exercise per Session: 0 min  Stress: No Stress Concern Present (01/04/2019)   [employer] of Occupational Health - Occupational Stress Questionnaire    Feeling of Stress : Not at all  Social Connections: Moderately Isolated (01/04/2019)   Social Connection and Isolation Panel    Frequency of Communication with Friends and Family: More than three times a week    Frequency of Social Gatherings with Friends and Family: More than three times a week    Attends Religious Services: 1 to 4 times per year    Active Member of [employer] or Organizations: No    Attends Banker Meetings: Never    Marital Status: Never married  Intimate  Partner Violence: Not At Risk (07/30/2023)   Humiliation, Afraid, Rape, and Kick questionnaire    Fear of Current or Ex-Partner: No    Emotionally Abused: No    Physically Abused: No    Sexually Abused: No   Family History  Problem Relation Age of Onset   Hypertension Mother    Hyperlipidemia Mother    Diabetes Mother    Vitals:   09/02/23 1439  BP: (!) 139/101  Pulse: 71  SpO2: 95%  Weight: 276 lb 12.8 oz (125.6 kg)   Wt Readings from Last 3 Encounters:  09/02/23 276 lb 12.8 oz (125.6 kg)  08/06/23 287 lb 3.2 oz (130.3 kg)  08/02/23 290 lb 2 oz (131.6 kg)   Lab Results  Component Value Date   CREATININE 3.74 (H) 08/06/2023   CREATININE 4.09 (H) 08/02/2023   CREATININE 4.32 (H) 08/01/2023    PHYSICAL EXAM:  General: Well appearing. No resp difficulty HEENT: normal Neck: supple, no JVD Cor: Regular rhythm, rate. No rubs, gallops or murmurs Lungs: clear Abdomen: soft, nontender, nondistended. Extremities: no cyanosis, clubbing, rash, trace pitting edema bilateral lower legs Neuro: alert & oriented X 3. Moves all 4 extremities w/o difficulty. Affect pleasant   ECG: Not done  ASSESSMENT & PLAN:  1. 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  - Continue metolazone  2.5 mg every Monday.  -  Continue BiDil  1 tablet TID - Continue Jardiance  10 mg daily - GDMT limited by CKD IV. Restart as able - upcoming BMET next week for nephrology appointment so will not check it today - BNP 08/06/23  was 715.7  2. Paroxysmal atrial fibrillation/atrial flutter- - continue apixaban  5 mg twice daily for CHA2DS2-VASc score of at least 6. Denies abnormal bleeding - continue amio 200mg  daily  - TEE/DC-CV 3/24  - DC-CV 11/24 - Emergent DCCV 6/25  CKD stage IV- - Baseline Scr 2.8-3.4. recently up to 4 following last admission.  - BMET next week for nephrology - BMET 08/06/23 reviewed: sodium 139, potassium 4.1, creatinine 3.74 and GFR 18 - Avoid nephrotoxic agents where able - Not HD candidate with severe HF - has nephrology appt 07/25   4. Nonobstructive coronary artery disease- - Cath 2016 - saw cardiology ([place]) 04/24 - continue atorvastatin  80mg  daily - Last LDL 11/23 was 88.  - if lipids not checked with nephrology labs, will check them at next visit   5. Obstructive sleep apnea- - last sleep study done 12/2015 - wearing CPAP nightly   6. Type 2 diabetes- -A1c 7.7% on 01/17/23 -Continue SGLT2i -Per PCP   7. Recent Syncopal episode x 2 - suspect was related to AFL but also at high-risk for VT. VQ + for PE - Continue amiodarone  200 mg daily - no driving x 6 months - has previously not been an ICD candidate due to poor compliance - Previously seen by EP -> not candidate for ablation. Will refer back to EP for re-evaluation - zio done 06/25: min HR of 58 bpm, max HR of 171 bpm, and avg HR of 77 bpm. Predominant underlying rhythm was Sinus Rhythm. Bundle Branch Block/IVCD was present. 2 Supraventricular Tachycardia runs occurred, the run with the fastest interval lasting 17.9 secs with a max rate of 171 bpm (avg 122 bpm); the run with the fastest interval was also the longest. Isolated SVEs were occasional (2.2%, 11891), SVE Couplets were rare (<1.0%, 140), and SVE Triplets were rare (<1.0%, 8). Isolated VEs were frequent (6.2%, 34299), VE Couplets were rare (<1.0%, 861), and VE Triplets were rare (<1.0%, 85). Ventricular Bigeminy and Trigeminy were present. - discussed lifevest  and what it's use is for. Patient is now agreeable (had declined at recent admission). Order placed.    Return in 2 months, sooner if needed.   Ellouise DELENA Class FNP-BC  09/02/23

## 2023-09-02 NOTE — Patient Instructions (Signed)
 Medication Changes:  REFILLED medications  Referrals:  You have been referred to [facility] with Electrophysiology. They will contact you in order to set up an appointment.   Follow-Up in: Please follow up with the [facility] in 2 months with Ellouise Class, FNP.  At the [facility], you and your health needs are our priority. We have a designated team specialized in the treatment of Heart Failure. This Care Team includes your primary Heart Failure Specialized Cardiologist (physician), Advanced Practice Providers (APPs- Physician Assistants and Nurse Practitioners), and Pharmacist who all work together to provide you with the care you need, when you need it.   You may see any of the following providers on your designated Care Team at your next follow up:  Dr. Toribio Fuel Dr. Ezra Shuck Dr. Ria Commander Dr. Odis Brownie Ellouise Class, FNP Jaun Bash, RPH-CPP  Please be sure to bring in all your medications bottles to every appointment.   Need to Contact Us :  If you have any questions or concerns before your next appointment please send us  a message through [place] or call our office at [phone].    TO LEAVE A MESSAGE FOR THE NURSE SELECT OPTION 2, PLEASE LEAVE A MESSAGE INCLUDING: YOUR NAME DATE OF BIRTH CALL BACK NUMBER REASON FOR CALL**this is important as we prioritize the call backs  YOU WILL RECEIVE A CALL BACK THE SAME DAY AS LONG AS YOU CALL BEFORE 4:00 PM

## 2023-09-06 NOTE — Addendum Note (Signed)
 Encounter addended by: Debarah Garrison MATSU, RN on: 09/06/2023 11:52 AM  Actions taken: Imaging Exam ended

## 2023-11-03 ENCOUNTER — Telehealth: Payer: Self-pay | Admitting: Family

## 2023-11-03 ENCOUNTER — Other Ambulatory Visit: Payer: Self-pay

## 2023-11-03 DIAGNOSIS — G4733 Obstructive sleep apnea (adult) (pediatric): Secondary | ICD-10-CM

## 2023-11-03 NOTE — Progress Notes (Unsigned)
 ADVANCED HF CLINIC NOTE   Primary Care: [name], MD Primary Cardiologist: Evalene Lunger, MD  HP provider: Cherrie Sieving, MD   Chief Complaint: shortness of breath   HPI:   Mr. Bentsen is a 58 y.o. male with ETOH use, DM2, morbid obesity, PAF (h/o GIB on apixaban ),  OSA on CPAP, CKD stage IV (Scr ~3.3), medication noncompliance.    HF dates back to at least 2009, thought to presumably be nonischemic in the setting of alcohol use, with an EF of 20%.     Echo 2016 EF 20-25%. LHC 12/2014 minimal CAD   EF initially improved with restoration of NSR but then dropped back down    He underwent TEE/DCCV in 02/2021 at that time showed an EF of less than 20% with severely reduced RV systolic function.  He was reevaluated by EP in 04/2021 and felt not to be a good candidate for ICD or ablation given medication noncompliance.   Admitted in 9/23 & 10/23 with ADHF. Limited echo 10/23 EF <  20% with moderate RV dysfunction and moderate to sever MR.  He was discharged from the hospital on 12/12/2021 with a weight of 247 pounds.     Admitted 11/23, 12/23, 1/24 and 3/24 with recurrent HF.   In 3/24 underwent TEE-guided DCCV as well as RHC. TEE EF < 20%, mod RV HK, mod MR. RHC with markedly elevated filling pressures and low CO. RA 17, PA 58/39 (43), PCWP 33, Fick 3.4/1.5 (PA sat 49%), PAPi 1.2.   Seen in ER 05/18/22 with recurrent volume overload. Lasix  switched to torsemide  and metolazone  added.  Admitted 01/16/23 due to abdominal pain and found to be tachycardic. CT scan of the abdomen without contrast did not find any findings in the abdomen or pelvis to explain the patient's abdominal pain, cholelithiasis, renal cysts. Worsening renal function so diuretic held and IVF given. Cardiology and nephrology consults done. Was in atrial flutter with successful cardioversion. Given amiodarone .   Echo 01/19/23: EF <20% with mild MR, no thrombus  Admitted 07/28/23 with syncope. Found to be  in a fib RVR. VQ + for possible RLL PE. Resp panel + for coronavirus OC43. Echo 07/29/23 EF 20-25% RV ok. Loaded with IV amiodarone . Refused lifevest, no driving for 6 months. Cardioverted to NSR. SCr >4 at discharge. GDMT limited with renal function and hyperkalemia. Was supposed to discharge with a Zio but failed to get it placed prior to d/c.   Seen in [facility] 06/25 and was given an extra 2.5mg  metolazone  due to edema. 2 week zio placed. Patient refused lifevest.   He presents today for a HF follow-up visit with a chief complaint of shortness of breath. Has associated fatigue, occasional palpitations. Requesting refills on all his HF meds but says that he hasn't run out of anything yet. Has upcoming nephrology appointment later this month & has lab work next week before his nephrology appointment. Checks his BP at home and says that at home ir runs 120's-130's. Would like zio results.   ROS: All systems negative except what is listed in HPI, PMH and Problem List   Past Medical History:  Diagnosis Date   Alcohol abuse    Alcoholic cardiomyopathy (HCC)    a. 12/2007 MV: EF 28%, no isch;  b. 8/12 Echo: EF 25-35%; c. 02/2014 Echo: EF 20-25%; d. 12/2014 Cath: minimal CAD; e. 01/2015 Echo: EF 50-55%;  d. 05/2016 Echo: EF 30-35%, diff HK, gr2 DD; e. 11/2016 Echo: EF 45-50%, diff HK;  f. 09/2021 Echo: EF <20%; g. 11/2021 Echo: EF<20%.   Chronic combined systolic (congestive) and diastolic (congestive) heart failure (HCC)    a. 05/2016 Echo: EF 30-35%; b. 11/2016 Echo: EF 45-50%, diff HK; c. 09/2021 Echo: EF < 20%; d. 11/2021 Echo: EF <20%, no rwma, Nl RV fxn, sev dil LA, mod-sev MR. mod dil PA w/ mildly elev PASP.   CKD (chronic kidney disease), stage III (HCC)    Elevated troponin (chronic)    Essential hypertension    GI bleed 11/2013   Hyperlipidemia    Pancreatitis    Paroxysmal A-fib (HCC)    a. new onset s/p unsuccessful TEE/DCCV on 08/16/2014; b. CHA2DS2VASc = 3-> eliquis  (freq noncompliant); c. 02/2021  s/p TEE/DCCV; d. Prev on amio->d/c 2/2 abnl TFTs.   Paroxysmal atrial flutter (HCC)    Sleep-disordered breathing    Has yet to have a sleep study   Stroke [facility])     Current Outpatient Medications  Medication Sig Dispense Refill   allopurinol  (ZYLOPRIM ) 100 MG tablet Take 100 mg by mouth daily.     amiodarone  (PACERONE ) 200 MG tablet Take 1 tablet (200 mg total) by mouth daily. 90 tablet 1   apixaban  (ELIQUIS ) 5 MG TABS tablet Take 2 tabs twice a day until 6/14, then take 1 tab twice a day 180 tablet 3   atorvastatin  (LIPITOR ) 80 MG tablet Take 1 tablet (80 mg total) by mouth daily. 90 tablet 1   calcitRIOL (ROCALTROL) 0.25 MCG capsule Take 0.25 mcg by mouth 3 (three) times a week.     empagliflozin  (JARDIANCE ) 10 MG TABS tablet Take 1 tablet (10 mg total) by mouth daily. 90 tablet 5   gabapentin  (NEURONTIN ) 100 MG capsule Take 1 capsule (100 mg total) by mouth 2 (two) times daily. (Patient not taking: Reported on 09/02/2023) 60 capsule 0   levothyroxine  (SYNTHROID ) 125 MCG tablet Take 125 mcg by mouth daily before breakfast.     lipase/protease/amylase (CREON ) 36000 UNITS CPEP capsule Take 1 capsule (36,000 Units total) by mouth 3 (three) times daily before meals. 180 capsule 0   metolazone  (ZAROXOLYN ) 2.5 MG tablet Take 1 tablet (2.5 mg total) by mouth once a week. On mondays 5 tablet 2   polyethylene glycol (MIRALAX  / GLYCOLAX ) 17 g packet Take 17 g by mouth daily as needed for mild constipation. 30 each 0   torsemide  (DEMADEX ) 20 MG tablet Take 2 tablets (40 mg total) by mouth daily. 60 tablet 0   No current facility-administered medications for this visit.   Allergies  Allergen Reactions   Esomeprazole Magnesium  Other (See Comments)    Suspected interstitial nephritis 2018   Egg-Derived Products Rash   Social History   Socioeconomic History   Marital status: Single    Spouse name: Not on file   Number of children: Not on file   Years of education: Not on file   Highest  education level: Not on file  Occupational History   Not on file  Tobacco Use   Smoking status: Former    Current packs/day: 0.00    Average packs/day: 1 pack/day for 12.0 years (12.0 ttl pk-yrs)    Types: Cigarettes    Start date: 02/23/1986    Quit date: 02/23/1998    Years since quitting: 25.7   Smokeless tobacco: Never  Vaping Use   Vaping status: Never Used  Substance and Sexual Activity   Alcohol use: Not Currently    Alcohol/week: 0.0 standard drinks of alcohol  Comment: Past heavy drinker   Drug use: No   Sexual activity: Not Currently  Other Topics Concern   Not on file  Social History Narrative   Not on file   Social Drivers of Health   Financial Resource Strain: Low Risk  (01/19/2023)   Overall Financial Resource Strain (CARDIA)    Difficulty of Paying Living Expenses: Not hard at all  Food Insecurity: No Food Insecurity (07/30/2023)   Hunger Vital Sign    Worried About Running Out of Food in the Last Year: Never true    Ran Out of Food in the Last Year: Never true  Transportation Needs: No Transportation Needs (07/30/2023)   PRAPARE - Administrator, Civil Service (Medical): No    Lack of Transportation (Non-Medical): No  Physical Activity: Inactive (01/04/2019)   Exercise Vital Sign    Days of Exercise per Week: 0 days    Minutes of Exercise per Session: 0 min  Stress: No Stress Concern Present (01/04/2019)   [employer] of Occupational Health - Occupational Stress Questionnaire    Feeling of Stress : Not at all  Social Connections: Moderately Isolated (01/04/2019)   Social Connection and Isolation Panel    Frequency of Communication with Friends and Family: More than three times a week    Frequency of Social Gatherings with Friends and Family: More than three times a week    Attends Religious Services: 1 to 4 times per year    Active Member of [employer] or Organizations: No    Attends Banker Meetings: Never    Marital Status:  Never married  Intimate Partner Violence: Not At Risk (07/30/2023)   Humiliation, Afraid, Rape, and Kick questionnaire    Fear of Current or Ex-Partner: No    Emotionally Abused: No    Physically Abused: No    Sexually Abused: No   Family History  Problem Relation Age of Onset   Hypertension Mother    Hyperlipidemia Mother    Diabetes Mother    There were no vitals filed for this visit.  Wt Readings from Last 3 Encounters:  09/02/23 276 lb 12.8 oz (125.6 kg)  08/06/23 287 lb 3.2 oz (130.3 kg)  08/02/23 290 lb 2 oz (131.6 kg)   Lab Results  Component Value Date   CREATININE 3.74 (H) 08/06/2023   CREATININE 4.09 (H) 08/02/2023   CREATININE 4.32 (H) 08/01/2023    PHYSICAL EXAM:  General: Well appearing. No resp difficulty HEENT: normal Neck: supple, no JVD Cor: Regular rhythm, rate. No rubs, gallops or murmurs Lungs: clear Abdomen: soft, nontender, nondistended. Extremities: no cyanosis, clubbing, rash, trace pitting edema bilateral lower legs Neuro: alert & oriented X 3. Moves all 4 extremities w/o difficulty. Affect pleasant   ECG: Not done  ASSESSMENT & PLAN:  1. 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  - Continue metolazone  2.5 mg every Monday.  - Continue BiDil  1 tablet TID - Continue Jardiance  10 mg daily - GDMT  limited by CKD IV. Restart as able - upcoming BMET next week for nephrology appointment so will not check it today - BNP 08/06/23 was 715.7  2. Paroxysmal atrial  fibrillation/atrial flutter- - continue apixaban  5 mg twice daily for CHA2DS2-VASc score of at least 6. Denies abnormal bleeding - continue amio 200mg  daily  - TEE/DC-CV 3/24  - DC-CV 11/24 - Emergent DCCV 6/25  CKD stage IV- - Baseline Scr 2.8-3.4. recently up to 4 following last admission.  - BMET next week for nephrology - BMET 08/06/23 reviewed: sodium 139, potassium 4.1, creatinine 3.74 and GFR 18 - Avoid nephrotoxic agents where able - Not HD candidate with severe HF - has nephrology appt 07/25   4. Nonobstructive coronary artery disease- - Cath 2016 - saw cardiology (Dunn) 04/24 - continue atorvastatin  80mg  daily - Last LDL 11/23 was 88.  - if lipids not checked with nephrology labs, will check them at next visit   5. Obstructive sleep apnea- - last sleep study done 12/2015 - wearing CPAP nightly   6. Type 2 diabetes- -A1c 7.7% on 01/17/23 -Continue SGLT2i -Per PCP   7. Recent Syncopal episode x 2 - suspect was related to AFL but also at high-risk for VT. VQ + for PE - Continue amiodarone  200 mg daily - no driving x 6 months - has previously not been an ICD candidate due to poor compliance - Previously seen by EP -> not candidate for ablation. Will refer back to EP for re-evaluation - zio done 06/25: min HR of 58 bpm, max HR of 171 bpm, and avg HR of 77 bpm. Predominant underlying rhythm was Sinus Rhythm. Bundle Branch Block/IVCD was present. 2 Supraventricular Tachycardia runs occurred, the run with the fastest interval lasting 17.9 secs with a max rate of 171 bpm (avg 122 bpm); the run with the fastest interval was also the longest. Isolated SVEs were occasional (2.2%, 11891), SVE Couplets were rare (<1.0%, 140), and SVE Triplets were rare (<1.0%, 8). Isolated VEs were frequent (6.2%, 34299), VE Couplets were rare (<1.0%, 861), and VE Triplets were rare (<1.0%, 85). Ventricular Bigeminy and Trigeminy were present. - discussed lifevest and what it's use is for. Patient is  now agreeable (had declined at recent admission). Order placed.    Return in 2 months, sooner if needed.   Ellouise DELENA Class FNP-BC  11/03/23

## 2023-11-03 NOTE — Progress Notes (Signed)
 Per Ellouise Class, FNP, pt needs Pulmonolgy referral for sleep Apnea management.  Pulmonology referral placed.

## 2023-11-03 NOTE — Telephone Encounter (Signed)
 Called to confirm/remind patient of their appointment at the [facility] on 11/04/23.   Appointment:   [] Confirmed  [x] Left mess   [] No answer/No voice mail  [] VM Full/unable to leave message  [] Phone not in service  Patient reminded to bring all medications and/or complete list.  Confirmed patient has transportation. Gave directions, instructed to utilize valet parking.

## 2023-11-04 ENCOUNTER — Encounter: Payer: Self-pay | Admitting: Family

## 2023-11-04 ENCOUNTER — Ambulatory Visit: Attending: Family | Admitting: Family

## 2023-11-04 VITALS — BP 123/88 | HR 95 | Wt 286.0 lb

## 2023-11-04 DIAGNOSIS — I130 Hypertensive heart and chronic kidney disease with heart failure and stage 1 through stage 4 chronic kidney disease, or unspecified chronic kidney disease: Secondary | ICD-10-CM | POA: Insufficient documentation

## 2023-11-04 DIAGNOSIS — I428 Other cardiomyopathies: Secondary | ICD-10-CM | POA: Diagnosis not present

## 2023-11-04 DIAGNOSIS — Z79899 Other long term (current) drug therapy: Secondary | ICD-10-CM | POA: Diagnosis not present

## 2023-11-04 DIAGNOSIS — Z7901 Long term (current) use of anticoagulants: Secondary | ICD-10-CM | POA: Insufficient documentation

## 2023-11-04 DIAGNOSIS — I447 Left bundle-branch block, unspecified: Secondary | ICD-10-CM | POA: Diagnosis not present

## 2023-11-04 DIAGNOSIS — Z91148 Patient's other noncompliance with medication regimen for other reason: Secondary | ICD-10-CM | POA: Diagnosis not present

## 2023-11-04 DIAGNOSIS — R55 Syncope and collapse: Secondary | ICD-10-CM | POA: Insufficient documentation

## 2023-11-04 DIAGNOSIS — I4892 Unspecified atrial flutter: Secondary | ICD-10-CM | POA: Insufficient documentation

## 2023-11-04 DIAGNOSIS — I2510 Atherosclerotic heart disease of native coronary artery without angina pectoris: Secondary | ICD-10-CM | POA: Insufficient documentation

## 2023-11-04 DIAGNOSIS — Z87891 Personal history of nicotine dependence: Secondary | ICD-10-CM | POA: Diagnosis not present

## 2023-11-04 DIAGNOSIS — Z7984 Long term (current) use of oral hypoglycemic drugs: Secondary | ICD-10-CM | POA: Insufficient documentation

## 2023-11-04 DIAGNOSIS — Z79890 Hormone replacement therapy (postmenopausal): Secondary | ICD-10-CM | POA: Diagnosis not present

## 2023-11-04 DIAGNOSIS — I480 Paroxysmal atrial fibrillation: Secondary | ICD-10-CM | POA: Insufficient documentation

## 2023-11-04 DIAGNOSIS — I5042 Chronic combined systolic (congestive) and diastolic (congestive) heart failure: Secondary | ICD-10-CM | POA: Diagnosis not present

## 2023-11-04 DIAGNOSIS — G4733 Obstructive sleep apnea (adult) (pediatric): Secondary | ICD-10-CM | POA: Insufficient documentation

## 2023-11-04 DIAGNOSIS — N184 Chronic kidney disease, stage 4 (severe): Secondary | ICD-10-CM | POA: Diagnosis not present

## 2023-11-04 DIAGNOSIS — R008 Other abnormalities of heart beat: Secondary | ICD-10-CM | POA: Insufficient documentation

## 2023-11-04 DIAGNOSIS — E1122 Type 2 diabetes mellitus with diabetic chronic kidney disease: Secondary | ICD-10-CM | POA: Diagnosis not present

## 2023-11-04 NOTE — Patient Instructions (Addendum)
 Medication Changes:  Reach out to your Primary Care Physician regarding your Gabapentin  prescription.   Lab Work:  Go over to [employer] [facility] on the LOWER LEVEL and have your blood work completed.  We will only call you if the results are abnormal or if the provider would like to make medication changes.  No news is good news.   Follow-Up in: 1-2 month with Dr. Bensimhon.   Thank you for choosing [facility].    At the [facility], you and your health needs are our priority. We have a designated team specialized in the treatment of Heart Failure. This Care Team includes your primary Heart Failure Specialized Cardiologist (physician), Advanced Practice Providers (APPs- Physician Assistants and Nurse Practitioners), and Pharmacist who all work together to provide you with the care you need, when you need it.   You may see any of the following providers on your designated Care Team at your next follow up:  Dr. Toribio Fuel Dr. Ezra Shuck Dr. Ria Commander Dr. Morene Brownie Ellouise Class, FNP Jaun Bash, RPH-CPP  Please be sure to bring in all your medications bottles to every appointment.   Need to Contact Us :  If you have any questions or concerns before your next appointment please send us  a message through [place] or call our office at [phone].    TO LEAVE A MESSAGE FOR THE NURSE SELECT OPTION 2, PLEASE LEAVE A MESSAGE INCLUDING: YOUR NAME DATE OF BIRTH CALL BACK NUMBER REASON FOR CALL**this is important as we prioritize the call backs  YOU WILL RECEIVE A CALL BACK THE SAME DAY AS LONG AS YOU CALL BEFORE 4:00 PM

## 2023-11-05 ENCOUNTER — Ambulatory Visit: Payer: Self-pay | Admitting: Family

## 2023-11-05 LAB — LIPID PANEL
Chol/HDL Ratio: 4.3 ratio (ref 0.0–5.0)
Cholesterol, Total: 205 mg/dL — ABNORMAL HIGH (ref 100–199)
HDL: 48 mg/dL (ref >39)
LDL Chol Calc (NIH): 131 mg/dL — ABNORMAL HIGH (ref 0–99)
Triglycerides: 147 mg/dL (ref 0–149)
VLDL Cholesterol Cal: 26 mg/dL (ref 5–40)

## 2023-11-10 ENCOUNTER — Ambulatory Visit (INDEPENDENT_AMBULATORY_CARE_PROVIDER_SITE_OTHER): Admitting: Sleep Medicine

## 2023-11-10 ENCOUNTER — Ambulatory Visit: Admitting: Cardiology

## 2023-11-10 ENCOUNTER — Encounter: Payer: Self-pay | Admitting: Sleep Medicine

## 2023-11-10 VITALS — BP 130/80 | HR 106 | Temp 97.7°F | Ht 71.0 in | Wt 285.0 lb

## 2023-11-10 DIAGNOSIS — G4733 Obstructive sleep apnea (adult) (pediatric): Secondary | ICD-10-CM

## 2023-11-10 DIAGNOSIS — I10 Essential (primary) hypertension: Secondary | ICD-10-CM | POA: Diagnosis not present

## 2023-11-10 NOTE — Patient Instructions (Signed)

## 2023-11-10 NOTE — Progress Notes (Signed)
 Name:Hector [name] MRN: [id number] DOB: 08-Aug-1965   CHIEF COMPLAINT:  ESTABLISH CARE FOR OSA   HISTORY OF PRESENT ILLNESS: Hector [name] is a 58 y.o. w/ a h/o OSA, DMII, atrial fibrillation, hypothyroidism, hyperlipidemia and obesity who presents to establish care for OSA. Reports that he was initially diagnosed with OSA in 2017 and subsequently started on CPAP therapy. Reports difficulty using CPAP therapy due to mask discomfort. He is currently using the Simplus FFM. Reports a 30 lb weight gain. Admits to snoring. Reports nocturnal awakenings due to nocturia, however does not have difficulty falling back to sleep. Denies morning headaches, RLS symptoms, dream enactment, cataplexy, hypnagogic or hypnapompic hallucinations. Denies a family history of sleep apnea. Denies drowsy driving. Drinks 1-2 sodas daily, denies alcohol, tobacco or illicit drug use.   Bedtime 8 pm Sleep onset 15 mins Rise time 7 am   EPWORTH SLEEP SCORE 3    11/10/2023    2:43 PM  Results of the Epworth flowsheet  Sitting and reading 0  Watching TV 0  Sitting, inactive in a public place (e.g. a theatre or a meeting) 0  As a passenger in a car for an hour without a break 0  Lying down to rest in the afternoon when circumstances permit 3  Sitting and talking to someone 0  Sitting quietly after a lunch without alcohol 0  In a car, while stopped for a few minutes in traffic 0  Total score 3    PAST MEDICAL HISTORY :   has a past medical history of Alcohol abuse, Alcoholic cardiomyopathy (HCC), Chronic combined systolic (congestive) and diastolic (congestive) heart failure (HCC), CKD (chronic kidney disease), stage III (HCC), Elevated troponin (chronic), Essential hypertension, GI bleed (11/2013), Hyperlipidemia, Pancreatitis, Paroxysmal A-fib (HCC), Paroxysmal atrial flutter (HCC), Sleep-disordered breathing, and Stroke (HCC).  has a past surgical history that includes Knee surgery (Right); NM  MYOVIEW  LTD (November 2011); TEE without cardioversion (N/A, 08/16/2014); Cardiac catheterization (N/A, 08/16/2014); Cardiac catheterization (N/A, 01/09/2015); Flexible sigmoidoscopy (N/A, 10/10/2015); Colonoscopy (N/A, 07/22/2020); TEE without cardioversion (N/A, 03/05/2021); Cardioversion (N/A, 03/05/2021); RIGHT HEART CATH (N/A, 04/28/2022); CENTRAL LINE INSERTION (04/28/2022); TEE without cardioversion (N/A, 05/01/2022); Esophagogastroduodenoscopy (egd) with propofol  (N/A, 05/04/2022); TEE without cardioversion (N/A, 01/19/2023); Cardioversion (N/A, 01/19/2023); RIGHT HEART CATH (N/A, 06/23/2023); TEE without cardioversion (N/A, 07/30/2023); and Cardioversion (N/A, 07/30/2023). Prior to Admission medications   Medication Sig Start Date End Date Taking? Authorizing Provider  allopurinol  (ZYLOPRIM ) 100 MG tablet Take 100 mg by mouth daily. 05/03/23   [provider]  amiodarone  (PACERONE ) 200 MG tablet Take 1 tablet (200 mg total) by mouth daily. 09/02/23   Donette Ellouise LABOR, FNP  apixaban  (ELIQUIS ) 5 MG TABS tablet Take 2 tabs twice a day until 6/14, then take 1 tab twice a day 09/02/23   Donette Ellouise LABOR, FNP  atorvastatin  (LIPITOR ) 80 MG tablet Take 1 tablet (80 mg total) by mouth daily. Patient not taking: Reported on 11/04/2023 09/02/23   Donette Ellouise LABOR, FNP  calcitRIOL (ROCALTROL) 0.25 MCG capsule Take 0.25 mcg by mouth 3 (three) times a week. Patient not taking: Reported on 11/04/2023    [provider]  empagliflozin  (JARDIANCE ) 10 MG TABS tablet Take 1 tablet (10 mg total) by mouth daily. 09/02/23   Donette Ellouise LABOR, FNP  gabapentin  (NEURONTIN ) 100 MG capsule Take 1 capsule (100 mg total) by mouth 2 (two) times daily. Patient not taking: Reported on 11/04/2023 01/19/23   Josette Ade, MD  levothyroxine  (SYNTHROID ) 125 MCG  tablet Take 125 mcg by mouth daily before breakfast.    [provider]  lipase/protease/amylase (CREON ) 36000 UNITS CPEP capsule Take 1 capsule (36,000 Units total) by  mouth 3 (three) times daily before meals. Patient not taking: Reported on 11/04/2023 01/19/23   Josette Ade, MD  metolazone  (ZAROXOLYN ) 2.5 MG tablet Take 1 tablet (2.5 mg total) by mouth once a week. On mondays 09/02/23   Donette Ellouise LABOR, FNP  polyethylene glycol (MIRALAX  / GLYCOLAX ) 17 g packet Take 17 g by mouth daily as needed for mild constipation. 01/19/23   Josette Ade, MD  torsemide  (DEMADEX ) 20 MG tablet Take 2 tablets (40 mg total) by mouth daily. 07/01/23 11/04/23  Lanetta Lingo, MD   Allergies  Allergen Reactions   Esomeprazole Magnesium  Other (See Comments)    Suspected interstitial nephritis 2018   Egg-Derived Products Rash    FAMILY HISTORY:  family history includes Diabetes in his mother; Hyperlipidemia in his mother; Hypertension in his mother. SOCIAL HISTORY:  reports that he quit smoking about 25 years ago. His smoking use included cigarettes. He started smoking about 37 years ago. He has a 12 pack-year smoking history. He has never used smokeless tobacco. He reports that he does not currently use alcohol. He reports that he does not use drugs.   Review of Systems:  Gen:  Denies  fever, sweats, chills weight loss  HEENT: Denies blurred vision, double vision, ear pain, eye pain, hearing loss, nose bleeds, sore throat Cardiac:  No dizziness, chest pain or heaviness, chest tightness,edema, No JVD Resp:   No cough, -sputum production, -shortness of breath,-wheezing, -hemoptysis,  Gi: Denies swallowing difficulty, stomach pain, nausea or vomiting, diarrhea, constipation, bowel incontinence Gu:  Denies bladder incontinence, burning urine Ext:   Denies Joint pain, stiffness or swelling Skin: Denies  skin rash, easy bruising or bleeding or hives Endoc:  Denies polyuria, polydipsia , polyphagia or weight change Psych:   Denies depression, insomnia or hallucinations  Other:  All other systems negative  VITAL SIGNS: BP 130/80   Pulse (!) 106   Temp 97.7 F (36.5  C) (Temporal)   Ht 5' 11 (1.803 m)   Wt 285 lb (129.3 kg)   SpO2 96%   BMI 39.75 kg/m    Physical Examination:   General Appearance: No distress  EYES PERRLA, EOM intact.   NECK Supple, No JVD Pulmonary: normal breath sounds, No wheezing.  CardiovascularNormal S1,S2.  No m/r/g.   Abdomen: Benign, Soft, non-tender. Skin:   warm, no rashes, no ecchymosis  Extremities: normal, no cyanosis, clubbing. Neuro:without focal findings,  speech normal  PSYCHIATRIC: Mood, affect within normal limits.   ASSESSMENT AND PLAN  OSA For mask discomfort, will try patient on the Airfit F40 FFM. Also adjusted CPAP therapy to auto PAP set to 6-20 cm H2O. Discussed the consequences of untreated sleep apnea. Advised not to drive drowsy for safety of patient and others. Will follow up in 3 months.    HTN Stable, on current management. Following with PCP.    Patient  satisfied with Plan of action and management. All questions answered  I spent a total of 45 minutes reviewing chart data, face-to-face evaluation with the patient, counseling and coordination of care as detailed above.    Mariajose Mow, M.D.  Sleep Medicine [facility]

## 2023-11-11 ENCOUNTER — Ambulatory Visit: Admitting: Sleep Medicine

## 2023-11-29 ENCOUNTER — Telehealth: Payer: Self-pay

## 2023-11-29 NOTE — Telephone Encounter (Signed)
 Copied from CRM [id number]. Topic: Clinical - Medical Advice >> Nov 29, 2023  1:05 PM Devaughn RAMAN wrote: Reason for CRM: Patient called and stated his CPAP machine keeps going off and not coming back on, if he presses it will turn on briefly but not stay on for long and he would like to know what he should do about this, patient would like a callback regarding assistance with this.

## 2023-12-09 ENCOUNTER — Other Ambulatory Visit: Payer: Self-pay | Admitting: Family

## 2023-12-10 ENCOUNTER — Other Ambulatory Visit: Payer: Self-pay

## 2023-12-10 ENCOUNTER — Emergency Department
Admission: EM | Admit: 2023-12-10 | Discharge: 2023-12-10 | Disposition: A | Discharge status: Home / Self Care | Attending: Emergency Medicine | Admitting: Emergency Medicine

## 2023-12-10 DIAGNOSIS — M5432 Sciatica, left side: Secondary | ICD-10-CM

## 2023-12-10 DIAGNOSIS — I509 Heart failure, unspecified: Secondary | ICD-10-CM | POA: Diagnosis not present

## 2023-12-10 DIAGNOSIS — M5442 Lumbago with sciatica, left side: Secondary | ICD-10-CM | POA: Diagnosis not present

## 2023-12-10 DIAGNOSIS — M545 Low back pain, unspecified: Secondary | ICD-10-CM | POA: Diagnosis present

## 2023-12-10 DIAGNOSIS — Z8673 Personal history of transient ischemic attack (TIA), and cerebral infarction without residual deficits: Secondary | ICD-10-CM | POA: Diagnosis not present

## 2023-12-10 DIAGNOSIS — N189 Chronic kidney disease, unspecified: Secondary | ICD-10-CM | POA: Diagnosis not present

## 2023-12-10 DIAGNOSIS — Z7901 Long term (current) use of anticoagulants: Secondary | ICD-10-CM | POA: Insufficient documentation

## 2023-12-10 MED ORDER — ACETAMINOPHEN 500 MG PO TABS
1000.0000 mg | ORAL_TABLET | Freq: Once | ORAL | Status: AC
  Administered 2023-12-10: 1000 mg via ORAL
  Filled 2023-12-10: qty 2

## 2023-12-10 MED ORDER — LIDOCAINE 5 % EX PTCH
1.0000 | MEDICATED_PATCH | CUTANEOUS | Status: DC
  Administered 2023-12-10: 1 via TRANSDERMAL
  Filled 2023-12-10: qty 1

## 2023-12-10 MED ORDER — METHOCARBAMOL 500 MG PO TABS
500.0000 mg | ORAL_TABLET | Freq: Once | ORAL | Status: AC
  Administered 2023-12-10: 500 mg via ORAL
  Filled 2023-12-10: qty 1

## 2023-12-10 MED ORDER — TORSEMIDE 20 MG PO TABS
80.0000 mg | ORAL_TABLET | Freq: Once | ORAL | Status: AC
  Administered 2023-12-10: 80 mg via ORAL
  Filled 2023-12-10: qty 4

## 2023-12-10 MED ORDER — LIDOCAINE 5 % EX PTCH: 1.0000 | MEDICATED_PATCH | Freq: Two times a day (BID) | CUTANEOUS | 1 refills | Status: AC

## 2023-12-10 MED ORDER — METHOCARBAMOL 500 MG PO TABS: 500.0000 mg | ORAL_TABLET | Freq: Three times a day (TID) | ORAL | 0 refills | Status: AC | PRN

## 2023-12-10 NOTE — ED Triage Notes (Signed)
 Patient states low back pain that radiates down left leg x one week.

## 2023-12-10 NOTE — ED Notes (Signed)
 See triage note. Presents with back pain  States pain started about 1 week ago  Pain radiates into left leg  Denies any falls

## 2023-12-10 NOTE — Discharge Instructions (Addendum)
 Use Tylenol for pain and fevers.  Up to 1000 mg per dose, up to 4 times per day.  Do not take more than 4000 mg of Tylenol/acetaminophen within 24 hours..  Please use lidocaine patches at your site of pain.  Apply 1 patch at a time, leave on for 12 hours, then remove for 12 hours.  12 hours on, 12 hours off.  Do not apply more than 1 patch at a time.  Use Robaxin muscle relaxer as needed for more severe/breakthrough pain, up to 3 times per day. This medication can make some people sleepy, so do not use while driving, working or Designer, television/film set

## 2023-12-10 NOTE — ED Provider Notes (Signed)
 [facility] Provider Note    Event Date/Time   First MD Initiated Contact with Patient 12/10/23 1346     (approximate)   History   Back Pain   HPI  Hector [name] is a 58 y.o. male who presents to the ED for evaluation of Back Pain   I reviewed the [facility] nephrology clinic visit from July.  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  Anticoagulated on Eliquis   Patient presents for evaluation of atraumatic low back pain on the left side rating down his left leg.  Secondarily he reports somewhat lesser urinary output today and mild sensation of abdominal distention and concerns for retained fluid despite adherence to his diuretic schedule.  No shortness of breath.   Physical Exam   Triage Vital Signs: ED Triage Vitals [12/10/23 1323]  Encounter Vitals Group     BP (!) 123/110     Girls Systolic BP Percentile      Girls Diastolic BP Percentile      Boys Systolic BP Percentile      Boys Diastolic BP Percentile      Pulse Rate 96     Resp 18     Temp 99.5 F (37.5 C)     Temp Source Oral     SpO2 97 %     Weight 273 lb (123.8 kg)     Height 5' 11 (1.803 m)     Head Circumference      Peak Flow      Pain Score 8     Pain Loc      Pain Education      Exclude from Growth Chart     Most recent vital signs: Vitals:   12/10/23 1323 12/10/23 1427  BP: (!) 123/110 (!) 120/99  Pulse: 96 90  Resp: 18 16  Temp: 99.5 F (37.5 C)   SpO2: 97% 98%    General: Awake, no distress.  CV:  Good peripheral perfusion.  Resp:  Normal effort.  Abd:  No distention.  MSK:  No deformity noted.  Mild left-sided paraspinal lumbar tenderness without overlying skin changes or any midline bony tenderness Neuro:  No focal deficits appreciated. Other:     ED Results / Procedures / Treatments   Labs (all labs ordered are listed, but only abnormal results are displayed) Labs Reviewed - No data to  display  EKG   RADIOLOGY   Official radiology report(s): No results found.  PROCEDURES and INTERVENTIONS:  Procedures  Medications  lidocaine  (LIDODERM ) 5 % 1 patch (1 patch Transdermal Patch Applied 12/10/23 1449)  torsemide  (DEMADEX ) tablet 80 mg (80 mg Oral Given 12/10/23 1447)  acetaminophen  (TYLENOL ) tablet 1,000 mg (1,000 mg Oral Given 12/10/23 1447)  methocarbamol  (ROBAXIN ) tablet 500 mg (500 mg Oral Given 12/10/23 1447)     IMPRESSION / MDM / ASSESSMENT AND PLAN / ED COURSE  I reviewed the triage vital signs and the nursing notes.  Differential diagnosis includes, but is not limited to, sciatica, cauda equina, CHF exacerbation, pyelonephritis  {Patient presents with symptoms of an acute illness or injury that is potentially life-threatening.  Patient with extensive cardiac and renal history presents with atraumatic back pain and symptoms of sciatica suitable for outpatient management.  No red flag features, neurologic deficits, trauma or indications for imaging.  Improving symptoms with Lidoderm  patches, Robaxin  and Tylenol .  He denies any shortness of breath or respiratory symptoms but reports mild sensation of abdominal fluids/bloating  and questions that he is starting to have a CHF exacerbation.  Will provide an additional dose of oral torsemide  and he has decent urinary output here in the ED and overall reports feeling better.  We discussed close ED return precautions with regards to his respiratory status, fluid and CHF, we discussed return precautions regarding his back  Clinical Course as of 12/10/23 1622  Fri Dec 10, 2023  1605 Reassessed, has had 200cc urine , feeling better.  We discussed adherence to his diuretics and close ED return precautions [DS]    Clinical Course User Index [DS] Claudene Rover, MD     FINAL CLINICAL IMPRESSION(S) / ED DIAGNOSES   Final diagnoses:  Sciatica of left side     Rx / DC Orders   ED Discharge Orders           Ordered    lidocaine  (LIDODERM ) 5 %  Every 12 hours        12/10/23 1603    methocarbamol  (ROBAXIN ) 500 MG tablet  Every 8 hours PRN        12/10/23 1603             Note:  This document was prepared using Dragon voice recognition software and may include unintentional dictation errors.   Claudene Rover, MD 12/10/23 [phone]

## 2023-12-15 ENCOUNTER — Telehealth: Payer: Self-pay | Admitting: Family

## 2023-12-15 ENCOUNTER — Institutional Professional Consult (permissible substitution): Admitting: Cardiology

## 2023-12-15 NOTE — Telephone Encounter (Signed)
 Called to confirm/remind patient of their appointment [facility] on 12/16/[facility].   Appointment:   [x] Confirmed  [] Left mess   [] No answer/No voice mail  [] VM Full/unable to leave message  [] Phone not in service  Patient reminded to bring all medications and/or complete list.  Confirmed patient has transportation. Gave directions, instructed to utilize valet parking.

## 2023-12-16 ENCOUNTER — Other Ambulatory Visit: Payer: Self-pay

## 2023-12-16 ENCOUNTER — Emergency Department

## 2023-12-16 ENCOUNTER — Encounter: Admitting: Adult Health

## 2023-12-16 ENCOUNTER — Inpatient Hospital Stay
Admission: EM | Admit: 2023-12-16 | Discharge: 2023-12-19 | DRG: 682 | Disposition: A | Discharge status: Home / Self Care | Attending: Internal Medicine | Admitting: Internal Medicine

## 2023-12-16 DIAGNOSIS — I480 Paroxysmal atrial fibrillation: Secondary | ICD-10-CM | POA: Diagnosis present

## 2023-12-16 DIAGNOSIS — F1011 Alcohol abuse, in remission: Secondary | ICD-10-CM | POA: Diagnosis present

## 2023-12-16 DIAGNOSIS — I5023 Acute on chronic systolic (congestive) heart failure: Secondary | ICD-10-CM | POA: Diagnosis present

## 2023-12-16 DIAGNOSIS — Z8249 Family history of ischemic heart disease and other diseases of the circulatory system: Secondary | ICD-10-CM | POA: Diagnosis not present

## 2023-12-16 DIAGNOSIS — D509 Iron deficiency anemia, unspecified: Secondary | ICD-10-CM | POA: Diagnosis present

## 2023-12-16 DIAGNOSIS — E782 Mixed hyperlipidemia: Secondary | ICD-10-CM | POA: Diagnosis present

## 2023-12-16 DIAGNOSIS — N179 Acute kidney failure, unspecified: Secondary | ICD-10-CM | POA: Diagnosis present

## 2023-12-16 DIAGNOSIS — Z7901 Long term (current) use of anticoagulants: Secondary | ICD-10-CM

## 2023-12-16 DIAGNOSIS — K8020 Calculus of gallbladder without cholecystitis without obstruction: Secondary | ICD-10-CM | POA: Diagnosis present

## 2023-12-16 DIAGNOSIS — M545 Low back pain, unspecified: Secondary | ICD-10-CM | POA: Diagnosis present

## 2023-12-16 DIAGNOSIS — E039 Hypothyroidism, unspecified: Secondary | ICD-10-CM | POA: Diagnosis present

## 2023-12-16 DIAGNOSIS — Z6841 Body Mass Index (BMI) 40.0 and over, adult: Secondary | ICD-10-CM

## 2023-12-16 DIAGNOSIS — I5084 End stage heart failure: Secondary | ICD-10-CM | POA: Diagnosis present

## 2023-12-16 DIAGNOSIS — N184 Chronic kidney disease, stage 4 (severe): Secondary | ICD-10-CM | POA: Diagnosis present

## 2023-12-16 DIAGNOSIS — E669 Obesity, unspecified: Secondary | ICD-10-CM | POA: Diagnosis present

## 2023-12-16 DIAGNOSIS — Z87891 Personal history of nicotine dependence: Secondary | ICD-10-CM | POA: Diagnosis not present

## 2023-12-16 DIAGNOSIS — J441 Chronic obstructive pulmonary disease with (acute) exacerbation: Secondary | ICD-10-CM | POA: Diagnosis present

## 2023-12-16 DIAGNOSIS — I119 Hypertensive heart disease without heart failure: Secondary | ICD-10-CM | POA: Diagnosis present

## 2023-12-16 DIAGNOSIS — N390 Urinary tract infection, site not specified: Secondary | ICD-10-CM | POA: Diagnosis present

## 2023-12-16 DIAGNOSIS — R109 Unspecified abdominal pain: Secondary | ICD-10-CM | POA: Diagnosis present

## 2023-12-16 DIAGNOSIS — I2510 Atherosclerotic heart disease of native coronary artery without angina pectoris: Secondary | ICD-10-CM | POA: Diagnosis present

## 2023-12-16 DIAGNOSIS — R140 Abdominal distension (gaseous): Secondary | ICD-10-CM | POA: Diagnosis present

## 2023-12-16 DIAGNOSIS — N189 Chronic kidney disease, unspecified: Secondary | ICD-10-CM | POA: Diagnosis not present

## 2023-12-16 DIAGNOSIS — E1122 Type 2 diabetes mellitus with diabetic chronic kidney disease: Secondary | ICD-10-CM | POA: Diagnosis present

## 2023-12-16 DIAGNOSIS — Z79899 Other long term (current) drug therapy: Secondary | ICD-10-CM

## 2023-12-16 DIAGNOSIS — E1129 Type 2 diabetes mellitus with other diabetic kidney complication: Secondary | ICD-10-CM | POA: Diagnosis present

## 2023-12-16 DIAGNOSIS — Z83438 Family history of other disorder of lipoprotein metabolism and other lipidemia: Secondary | ICD-10-CM

## 2023-12-16 DIAGNOSIS — F1091 Alcohol use, unspecified, in remission: Secondary | ICD-10-CM | POA: Diagnosis present

## 2023-12-16 DIAGNOSIS — Z833 Family history of diabetes mellitus: Secondary | ICD-10-CM | POA: Diagnosis not present

## 2023-12-16 DIAGNOSIS — I509 Heart failure, unspecified: Secondary | ICD-10-CM

## 2023-12-16 DIAGNOSIS — Z8673 Personal history of transient ischemic attack (TIA), and cerebral infarction without residual deficits: Secondary | ICD-10-CM

## 2023-12-16 DIAGNOSIS — E1165 Type 2 diabetes mellitus with hyperglycemia: Secondary | ICD-10-CM | POA: Diagnosis present

## 2023-12-16 DIAGNOSIS — R7989 Other specified abnormal findings of blood chemistry: Secondary | ICD-10-CM

## 2023-12-16 DIAGNOSIS — I10 Essential (primary) hypertension: Secondary | ICD-10-CM | POA: Diagnosis present

## 2023-12-16 DIAGNOSIS — I428 Other cardiomyopathies: Secondary | ICD-10-CM

## 2023-12-16 DIAGNOSIS — J449 Chronic obstructive pulmonary disease, unspecified: Secondary | ICD-10-CM | POA: Diagnosis present

## 2023-12-16 DIAGNOSIS — I130 Hypertensive heart and chronic kidney disease with heart failure and stage 1 through stage 4 chronic kidney disease, or unspecified chronic kidney disease: Secondary | ICD-10-CM | POA: Diagnosis present

## 2023-12-16 LAB — URINALYSIS, W/ REFLEX TO CULTURE (INFECTION SUSPECTED)
Bilirubin Urine: NEGATIVE
Glucose, UA: >=500 mg/dL — AB
Ketones, ur: NEGATIVE mg/dL
Nitrite: NEGATIVE
Protein, ur: >=300 mg/dL — AB
Specific Gravity, Urine: 1.012 (ref 1.005–1.030)
WBC, UA: >50 WBC/hpf (ref 0–5)
pH: 7 (ref 5.0–8.0)

## 2023-12-16 LAB — COMPREHENSIVE METABOLIC PANEL WITH GFR
ALT: 83 U/L — ABNORMAL HIGH (ref 0–44)
AST: 192 U/L — ABNORMAL HIGH (ref 15–41)
Albumin: 3.6 g/dL (ref 3.5–5.0)
Alkaline Phosphatase: 76 U/L (ref 38–126)
Anion gap: 16 — ABNORMAL HIGH (ref 5–15)
BUN: 71 mg/dL — ABNORMAL HIGH (ref 6–20)
CO2: 25 mmol/L (ref 22–32)
Calcium: 8.7 mg/dL — ABNORMAL LOW (ref 8.9–10.3)
Chloride: 98 mmol/L (ref 98–111)
Creatinine, Ser: 5.78 mg/dL — ABNORMAL HIGH (ref 0.61–1.24)
GFR, Estimated: 11 mL/min — ABNORMAL LOW (ref >60)
Glucose, Bld: 130 mg/dL — ABNORMAL HIGH (ref 70–99)
Potassium: 3.7 mmol/L (ref 3.5–5.1)
Sodium: 139 mmol/L (ref 135–145)
Total Bilirubin: 1.7 mg/dL — ABNORMAL HIGH (ref 0.0–1.2)
Total Protein: 7.8 g/dL (ref 6.5–8.1)

## 2023-12-16 LAB — CBC
HCT: 44.3 % (ref 39.0–52.0)
Hemoglobin: 14 g/dL (ref 13.0–17.0)
MCH: 27.5 pg (ref 26.0–34.0)
MCHC: 31.6 g/dL (ref 30.0–36.0)
MCV: 87 fL (ref 80.0–100.0)
Platelets: 222 K/uL (ref 150–400)
RBC: 5.09 MIL/uL (ref 4.22–5.81)
RDW: 17.9 % — ABNORMAL HIGH (ref 11.5–15.5)
WBC: 7 K/uL (ref 4.0–10.5)
nRBC: 0 % (ref 0.0–0.2)

## 2023-12-16 LAB — BRAIN NATRIURETIC PEPTIDE: B Natriuretic Peptide: 538 pg/mL — ABNORMAL HIGH (ref 0.0–100.0)

## 2023-12-16 LAB — TROPONIN I (HIGH SENSITIVITY): Troponin I (High Sensitivity): 135 ng/L (ref <18)

## 2023-12-16 LAB — LIPASE, BLOOD: Lipase: 26 U/L (ref 11–51)

## 2023-12-16 MED ORDER — ONDANSETRON HCL 4 MG PO TABS
4.0000 mg | ORAL_TABLET | Freq: Four times a day (QID) | ORAL | Status: DC | PRN
  Administered 2023-12-19: 4 mg via ORAL
  Filled 2023-12-16: qty 1

## 2023-12-16 MED ORDER — LEVOTHYROXINE SODIUM 50 MCG PO TABS
125.0000 ug | ORAL_TABLET | Freq: Every day | ORAL | Status: DC
  Administered 2023-12-18 – 2023-12-19 (×2): 125 ug via ORAL
  Filled 2023-12-16 (×3): qty 1

## 2023-12-16 MED ORDER — CALCIUM CARBONATE ANTACID 1250 MG/5ML PO SUSP: 500.0000 mg | Freq: Four times a day (QID) | ORAL | Status: DC | PRN

## 2023-12-16 MED ORDER — SODIUM CHLORIDE 0.9 % IV SOLN
1.0000 g | INTRAVENOUS | Status: DC
  Administered 2023-12-16 – 2023-12-18 (×3): 1 g via INTRAVENOUS
  Filled 2023-12-16 (×3): qty 10

## 2023-12-16 MED ORDER — HYDROXYZINE HCL 25 MG PO TABS: 25.0000 mg | ORAL_TABLET | Freq: Three times a day (TID) | ORAL | Status: DC | PRN

## 2023-12-16 MED ORDER — ACETAMINOPHEN 650 MG RE SUPP: 650.0000 mg | Freq: Four times a day (QID) | RECTAL | Status: DC | PRN

## 2023-12-16 MED ORDER — FUROSEMIDE 10 MG/ML IJ SOLN
40.0000 mg | Freq: Once | INTRAMUSCULAR | Status: AC
  Administered 2023-12-16: 40 mg via INTRAVENOUS
  Filled 2023-12-16: qty 4

## 2023-12-16 MED ORDER — DOCUSATE SODIUM 283 MG RE ENEM: 1.0000 | ENEMA | RECTAL | Status: DC | PRN

## 2023-12-16 MED ORDER — SODIUM CHLORIDE 0.9 % IV SOLN: INTRAVENOUS | Status: AC | PRN

## 2023-12-16 MED ORDER — INSULIN ASPART 100 UNIT/ML IJ SOLN: 0.0000 [IU] | Freq: Every day | INTRAMUSCULAR | Status: DC

## 2023-12-16 MED ORDER — ACETAMINOPHEN 500 MG PO TABS
1000.0000 mg | ORAL_TABLET | Freq: Once | ORAL | Status: AC
  Administered 2023-12-16: 1000 mg via ORAL
  Filled 2023-12-16: qty 2

## 2023-12-16 MED ORDER — ASPIRIN 81 MG PO CHEW
324.0000 mg | CHEWABLE_TABLET | Freq: Once | ORAL | Status: DC
  Filled 2023-12-16: qty 4

## 2023-12-16 MED ORDER — AMIODARONE HCL 200 MG PO TABS
200.0000 mg | ORAL_TABLET | Freq: Every day | ORAL | Status: DC
  Administered 2023-12-17 – 2023-12-19 (×3): 200 mg via ORAL
  Filled 2023-12-16 (×3): qty 1

## 2023-12-16 MED ORDER — INSULIN ASPART 100 UNIT/ML IJ SOLN: 0.0000 [IU] | Freq: Three times a day (TID) | INTRAMUSCULAR | Status: DC

## 2023-12-16 MED ORDER — CAMPHOR-MENTHOL 0.5-0.5 % EX LOTN: 1.0000 | TOPICAL_LOTION | Freq: Three times a day (TID) | CUTANEOUS | Status: DC | PRN

## 2023-12-16 MED ORDER — APIXABAN 5 MG PO TABS
5.0000 mg | ORAL_TABLET | Freq: Two times a day (BID) | ORAL | Status: DC
  Administered 2023-12-16 – 2023-12-19 (×6): 5 mg via ORAL
  Filled 2023-12-16 (×6): qty 1

## 2023-12-16 MED ORDER — NEPRO/CARBSTEADY PO LIQD: 237.0000 mL | Freq: Three times a day (TID) | ORAL | Status: DC | PRN

## 2023-12-16 MED ORDER — ACETAMINOPHEN 325 MG PO TABS
650.0000 mg | ORAL_TABLET | Freq: Four times a day (QID) | ORAL | Status: DC | PRN
  Administered 2023-12-16: 650 mg via ORAL
  Filled 2023-12-16: qty 2

## 2023-12-16 MED ORDER — ONDANSETRON HCL 4 MG/2ML IJ SOLN: 4.0000 mg | Freq: Four times a day (QID) | INTRAMUSCULAR | Status: DC | PRN

## 2023-12-16 MED ORDER — ONDANSETRON HCL 4 MG/2ML IJ SOLN
4.0000 mg | Freq: Once | INTRAMUSCULAR | Status: AC
  Administered 2023-12-16: 4 mg via INTRAVENOUS
  Filled 2023-12-16: qty 2

## 2023-12-16 MED ORDER — SORBITOL 70 % SOLN: 30.0000 mL | Status: DC | PRN

## 2023-12-16 NOTE — ED Provider Notes (Signed)
 [facility] Provider Note    Event Date/Time   First MD Initiated Contact with Patient 12/16/23 1641     (approximate)   History   Abdominal Pain   HPI  Hector [name] is a 58 y.o. male past medical history significant for CKD, nonischemic CHF from alcohol abuse, LifeVest, atrial fibrillation on Eliquis , prior CVA, presents to the emergency department with abdominal pain and distention.  States that he was recently evaluated in the emergency department for low back pain and abdominal distention.  Had an increase of his Lasix .  States that since that time he is having ongoing abdominal distention.  Complaining of upper abdominal pain and unable to eat anything because he does not feel hungry and feels very full.  Normal bowel movements.  Denies any blood in his stool.  Does endorse some mild shortness of breath.  No significant chest pain.     Physical Exam   Triage Vital Signs: ED Triage Vitals  Encounter Vitals Group     BP 12/16/23 1238 (!) 114/103     Girls Systolic BP Percentile --      Girls Diastolic BP Percentile --      Boys Systolic BP Percentile --      Boys Diastolic BP Percentile --      Pulse Rate 12/16/23 1238 98     Resp 12/16/23 1238 18     Temp 12/16/23 1238 (!) 97.5 F (36.4 C)     Temp Source 12/16/23 1538 Oral     SpO2 12/16/23 1238 93 %     Weight --      Height --      Head Circumference --      Peak Flow --      Pain Score 12/16/23 1224 5     Pain Loc --      Pain Education --      Exclude from Growth Chart --     Most recent vital signs: Vitals:   12/16/23 1934 12/16/23 2042  BP:  (!) 139/127  Pulse: 83 76  Resp: 18 20  Temp:  (!) 97.5 F (36.4 C)  SpO2: 94% 99%    Physical Exam Constitutional:      Appearance: He is well-developed. He is obese.  HENT:     Head: Atraumatic.  Eyes:     Extraocular Movements: Extraocular movements intact.     Conjunctiva/sclera: Conjunctivae normal.     Pupils: Pupils  are equal, round, and reactive to light.  Cardiovascular:     Rate and Rhythm: Regular rhythm.     Comments: LifeVest Pulmonary:     Effort: No respiratory distress.  Abdominal:     Tenderness: There is generalized abdominal tenderness.  Musculoskeletal:     Cervical back: Normal range of motion.  Skin:    General: Skin is warm.     Capillary Refill: Capillary refill takes less than 2 seconds.  Neurological:     General: No focal deficit present.     Mental Status: He is alert. Mental status is at baseline.     IMPRESSION / MDM / ASSESSMENT AND PLAN / ED COURSE  I reviewed the triage vital signs and the nursing notes.  Differential diagnosis including small bowel obstruction, ACS, gastritis, CHF exacerbation, urinary tract infection, anemia, gastritis/PUD, acute cholecystitis    RADIOLOGY  CT scan abdomen and pelvis showed no acute findings.  Minimal cholelithiasis.  Diverticulosis but no signs of active inflammation.  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  Aortic arthrosclerosis.  Chest x-ray with cardiomegaly and edema.  Right perihilar opacity concerning for edema versus pneumonia  LABS (all labs ordered are listed, but only abnormal results are displayed) Labs interpreted as -    Labs Reviewed  COMPREHENSIVE METABOLIC PANEL WITH GFR - Abnormal; Notable for the following components:      Result Value   Glucose, Bld 130 (*)    BUN 71 (*)    Creatinine, Ser 5.78 (*)    Calcium  8.7 (*)    AST 192 (*)    ALT 83 (*)    Total Bilirubin 1.7 (*)    GFR, Estimated 11 (*)    Anion gap 16 (*)    All other components within normal limits  CBC - Abnormal; Notable for the following components:   RDW 17.9 (*)    All other components within normal limits  BRAIN NATRIURETIC PEPTIDE - Abnormal; Notable for the following components:   B Natriuretic Peptide 538.0 (*)    All other components within normal limits  URINALYSIS, W/ REFLEX TO CULTURE (INFECTION SUSPECTED) -  Abnormal; Notable for the following components:   Color, Urine YELLOW (*)    APPearance HAZY (*)    Glucose, UA >=500 (*)    Hgb urine dipstick SMALL (*)    Protein, ur >=300 (*)    Leukocytes,Ua LARGE (*)    Bacteria, UA RARE (*)    All other components within normal limits  TROPONIN I (HIGH SENSITIVITY) - Abnormal; Notable for the following components:   Troponin I (High Sensitivity) 135 (*)    All other components within normal limits  URINE CULTURE  MRSA NEXT GEN BY PCR, NASAL  CULTURE, BLOOD (ROUTINE X 2)  CULTURE, BLOOD (ROUTINE X 2)  LIPASE, BLOOD  RENAL FUNCTION PANEL  CBC     MDM  Patient found to have acute kidney injury with creatinine elevated up to 5.78 from a baseline of 3-4, does have an elevated BUN of 71.  Anion gap of 16 but has a normal CO2 of 25.  Has elevation of AST and ALT with mildly elevated T. bili at 1.7.  CT scan of the abdomen and pelvis with findings of cholelithiasis but no signs of common bile duct dilation or findings of acute cystitis.  No significant leukocytosis  Mildly elevated BNP initial troponin is elevated in the 100s which appears to have been present previously.  No active chest pain at this time, will obtain a second troponin.  Given IV Lasix   UA resulted after admission to the hospitalist for acute kidney injury and concern for heart failure exacerbation.  Leukocyte esterase positive and has greater than 50 WBCs.  Urine was sent for culture.  Hospitalist ordered blood cultures and started on antibiotics.  Patient admitted for acute kidney injury, urinary tract infection, CHF exacerbation and possible developing pneumonia.     PROCEDURES:  Critical Care performed: no   Procedures  Patient's presentation is most consistent with acute presentation with potential threat to life or bodily function.   MEDICATIONS ORDERED IN ED: Medications  aspirin  chewable tablet 324 mg (0 mg Oral Hold 12/16/23 1956)  amiodarone  (PACERONE ) tablet  200 mg (has no administration in time range)  levothyroxine  (SYNTHROID ) tablet 125 mcg (has no administration in time range)  apixaban  (ELIQUIS ) tablet 5 mg (5 mg Oral Given 12/16/23 2328)  acetaminophen  (TYLENOL ) tablet 650 mg (650 mg Oral Given 12/16/23 2328)    Or  acetaminophen  (TYLENOL )  suppository 650 mg ( Rectal See Alternative 12/16/23 2328)  camphor-menthol  (SARNA) lotion 1 Application (has no administration in time range)    And  hydrOXYzine (ATARAX) tablet 25 mg (has no administration in time range)  sorbitol 70 % solution 30 mL (has no administration in time range)  ondansetron  (ZOFRAN ) tablet 4 mg (has no administration in time range)    Or  ondansetron  (ZOFRAN ) injection 4 mg (has no administration in time range)  calcium  carbonate (dosed in mg elemental calcium ) suspension 500 mg of elemental calcium  (has no administration in time range)  feeding supplement (NEPRO CARB STEADY) liquid 237 mL (has no administration in time range)  cefTRIAXone  (ROCEPHIN ) 1 g in sodium chloride  0.9 % 100 mL IVPB (1 g Intravenous New Bag/Given 12/16/23 2328)  insulin  aspart (novoLOG ) injection 0-6 Units (has no administration in time range)  insulin  aspart (novoLOG ) injection 0-5 Units (has no administration in time range)  0.9 %  sodium chloride  infusion ( Intravenous New Bag/Given 12/16/23 2327)  furosemide  (LASIX ) injection 40 mg (40 mg Intravenous Given 12/16/23 1839)  ondansetron  (ZOFRAN ) injection 4 mg (4 mg Intravenous Given 12/16/23 1838)  acetaminophen  (TYLENOL ) tablet 1,000 mg (1,000 mg Oral Given 12/16/23 1839)    FINAL CLINICAL IMPRESSION(S) / ED DIAGNOSES   Final diagnoses:  AKI (acute kidney injury)  Abdominal distention  Elevated troponin  Acute on chronic congestive heart failure, unspecified heart failure type (HCC)     Rx / DC Orders   ED Discharge Orders     None        Note:  This document was prepared using Dragon voice recognition software and may include  unintentional dictation errors.   Suzanne Kirsch, MD 12/17/23 [phone]

## 2023-12-16 NOTE — ED Triage Notes (Signed)
 First nurse note: Pt to ED via [facility] from home. Pt reports abd pain x2 wks. Pt seen here on 10/17 for back pain and reports told them about his abd pain. Pt reports it is aching. Abdomen is distended x2 wks.   22g LH 100cc NS 98.0 oral 119 HR h xof afib 94% RA 141/98

## 2023-12-16 NOTE — ED Notes (Signed)
 Pt requested to sit in a recliner for comfort. Pt positioned in the recliner and instructed not to get up. Pt given urinal.

## 2023-12-16 NOTE — H&P (Addendum)
 History and Physical    Patient: Hector [name]:[id number] DOB: 1966/01/15 DOA: 12/16/2023 DOS: the patient was seen and examined on 12/16/2023 PCP: [name], MD  Patient coming from: Home  Chief Complaint:  Chief Complaint  Patient presents with   Abdominal Pain   HPI: Hector [name] is a 58 y.o. male with medical history significant of chronic kidney disease stage IV, heart failure with reduced ejection fraction with a EF of 20 to 25%, diabetes, essential hypertension, hyperlipidemia, paroxysmal atrial fibrillation, alcoholic cardiomyopathy among other things who presented to the ER complaining of provide 2 weeks of progressive abdominal pain and distention.  He was having back pain and was seen here just last week in the ER.  He was treated for back pain and sent home.  It is not clear how much compliance patient has had.  Today however is noted to have markedly distended abdomen with shifting dullness.  Only trace pedal edema.  He is not requiring oxygen.  His renal function however has worsened with creatinine going from 3.74 previously to 5.78.  His GFR has significantly worsened.  Patient also had elevated troponin of 135 at the first set.  His urinalysis is worrisome for possible UTI patient is being admitted with acute on chronic kidney disease stage IV as well as possible UTI.  No obstruction on CT abdomen.  Review of Systems: As mentioned in the history of present illness. All other systems reviewed and are negative. Past Medical History:  Diagnosis Date   Alcohol abuse    Alcoholic cardiomyopathy (HCC)    a. 12/2007 MV: EF 28%, no isch;  b. 8/12 Echo: EF 25-35%; c. 02/2014 Echo: EF 20-25%; d. 12/2014 Cath: minimal CAD; e. 01/2015 Echo: EF 50-55%;  d. 05/2016 Echo: EF 30-35%, diff HK, gr2 DD; e. 11/2016 Echo: EF 45-50%, diff HK; f. 09/2021 Echo: EF <20%; g. 11/2021 Echo: EF<20%.   Chronic combined systolic (congestive) and diastolic (congestive) heart failure  (HCC)    a. 05/2016 Echo: EF 30-35%; b. 11/2016 Echo: EF 45-50%, diff HK; c. 09/2021 Echo: EF < 20%; d. 11/2021 Echo: EF <20%, no rwma, Nl RV fxn, sev dil LA, mod-sev MR. mod dil PA w/ mildly elev PASP.   CKD (chronic kidney disease), stage III (HCC)    Elevated troponin (chronic)    Essential hypertension    GI bleed 11/2013   Hyperlipidemia    Pancreatitis    Paroxysmal A-fib (HCC)    a. new onset s/p unsuccessful TEE/DCCV on 08/16/2014; b. CHA2DS2VASc = 3-> eliquis  (freq noncompliant); c. 02/2021 s/p TEE/DCCV; d. Prev on amio->d/c 2/2 abnl TFTs.   Paroxysmal atrial flutter (HCC)    Sleep-disordered breathing    Has yet to have a sleep study   Stroke [facility])    Past Surgical History:  Procedure Laterality Date   CARDIAC CATHETERIZATION N/A 01/09/2015   Procedure: Left Heart Cath and Coronary Angiography;  Surgeon: Alm LELON Clay, MD;  Location: [facility] INVASIVE CV LAB;  Service: Cardiovascular;  Laterality: N/A;   CARDIOVERSION N/A 03/05/2021   Procedure: CARDIOVERSION;  Surgeon: Darliss Rogue, MD;  Location: [facility] ORS;  Service: Cardiovascular;  Laterality: N/A;   CARDIOVERSION N/A 01/19/2023   Procedure: CARDIOVERSION;  Surgeon: Rolan Ezra RAMAN, MD;  Location: [facility] ORS;  Service: Cardiovascular;  Laterality: N/A;   CARDIOVERSION N/A 07/30/2023   Procedure: CARDIOVERSION;  Surgeon: Cherrie Toribio SAUNDERS, MD;  Location: [facility] ORS;  Service: Cardiovascular;  Laterality: N/A;   CENTRAL LINE INSERTION  04/28/2022  Procedure: CENTRAL LINE INSERTION;  Surgeon: Mady Bruckner, MD;  Location: [facility] INVASIVE CV LAB;  Service: Cardiovascular;;   COLONOSCOPY N/A 07/22/2020   Procedure: COLONOSCOPY;  Surgeon: Toledo, Ladell POUR, MD;  Location: [facility] ENDOSCOPY;  Service: Gastroenterology;  Laterality: N/A;   ELECTROPHYSIOLOGIC STUDY N/A 08/16/2014   Procedure: CARDIOVERSION;  Surgeon: Evalene JINNY Lunger, MD;  Location: [facility] ORS;  Service: Cardiovascular;  Laterality: N/A;   ESOPHAGOGASTRODUODENOSCOPY (EGD)  WITH PROPOFOL  N/A 05/04/2022   Procedure: ESOPHAGOGASTRODUODENOSCOPY (EGD) WITH PROPOFOL ;  Surgeon: Unk Corinn Skiff, MD;  Location: [facility] ENDOSCOPY;  Service: Gastroenterology;  Laterality: N/A;   FLEXIBLE SIGMOIDOSCOPY N/A 10/10/2015   Procedure: FLEXIBLE SIGMOIDOSCOPY;  Surgeon: Gladis RAYMOND Mariner, MD;  Location: [facility] ENDOSCOPY;  Service: Endoscopy;  Laterality: N/A;   KNEE SURGERY Right    NM MYOVIEW  LTD  November 2011   No ischemia or infarction. EF 50-55% ( no improvement from 2009 Myoview  EF of 28%   RIGHT HEART CATH N/A 04/28/2022   Procedure: RIGHT HEART CATH;  Surgeon: Mady Bruckner, MD;  Location: [facility] INVASIVE CV LAB;  Service: Cardiovascular;  Laterality: N/A;   RIGHT HEART CATH N/A 06/23/2023   Procedure: RIGHT HEART CATH;  Surgeon: Rolan Ezra RAMAN, MD;  Location: [facility] INVASIVE CV LAB;  Service: Cardiovascular;  Laterality: N/A;   TEE WITHOUT CARDIOVERSION N/A 08/16/2014   Procedure: TRANSESOPHAGEAL ECHOCARDIOGRAM (TEE);  Surgeon: Evalene JINNY Lunger, MD;  Location: [facility] ORS;  Service: Cardiovascular;  Laterality: N/A;   TEE WITHOUT CARDIOVERSION N/A 03/05/2021   Procedure: TRANSESOPHAGEAL ECHOCARDIOGRAM (TEE);  Surgeon: Darliss Rogue, MD;  Location: [facility] ORS;  Service: Cardiovascular;  Laterality: N/A;   TEE WITHOUT CARDIOVERSION N/A 05/01/2022   Procedure: TRANSESOPHAGEAL ECHOCARDIOGRAM;  Surgeon: Rolan Ezra RAMAN, MD;  Location: [facility] ORS;  Service: Cardiovascular;  Laterality: N/A;  TEE with cardioversion   TEE WITHOUT CARDIOVERSION N/A 01/19/2023   Procedure: TRANSESOPHAGEAL ECHOCARDIOGRAM (TEE);  Surgeon: Rolan Ezra RAMAN, MD;  Location: [facility] ORS;  Service: Cardiovascular;  Laterality: N/A;   TEE WITHOUT CARDIOVERSION N/A 07/30/2023   Procedure: ECHOCARDIOGRAM, TRANSESOPHAGEAL;  Surgeon: Cherrie Toribio SAUNDERS, MD;  Location: [facility] ORS;  Service: Cardiovascular;  Laterality: N/A;   Social History:  reports that he quit smoking about 25 years ago. His smoking use included cigarettes.  He started smoking about 37 years ago. He has a 12 pack-year smoking history. He has never used smokeless tobacco. He reports that he does not currently use alcohol. He reports that he does not use drugs.  Allergies  Allergen Reactions   Esomeprazole Magnesium  Other (See Comments)    Suspected interstitial nephritis 2018   Egg Protein-Containing Drug Products Rash    Family History  Problem Relation Age of Onset   Hypertension Mother    Hyperlipidemia Mother    Diabetes Mother     Prior to Admission medications   Medication Sig Start Date End Date Taking? Authorizing Provider  allopurinol  (ZYLOPRIM ) 100 MG tablet Take 100 mg by mouth daily. 05/03/23   [provider]  amiodarone  (PACERONE ) 200 MG tablet Take 1 tablet (200 mg total) by mouth daily. 09/02/23   Donette Ellouise LABOR, FNP  apixaban  (ELIQUIS ) 5 MG TABS tablet Take 2 tabs twice a day until 6/14, then take 1 tab twice a day 09/02/23   Donette Ellouise A, FNP  atorvastatin  (LIPITOR ) 80 MG tablet Take 1 tablet (80 mg total) by mouth daily. Patient not taking: Reported on 11/10/2023 09/02/23   Donette Ellouise LABOR, FNP  calcitRIOL (ROCALTROL) 0.25 MCG capsule Take 0.25 mcg by  mouth 3 (three) times a week. Patient not taking: Reported on 11/10/2023    [provider]  empagliflozin  (JARDIANCE ) 10 MG TABS tablet Take 1 tablet (10 mg total) by mouth daily. 09/02/23   Donette Ellouise LABOR, FNP  gabapentin  (NEURONTIN ) 100 MG capsule Take 1 capsule (100 mg total) by mouth 2 (two) times daily. Patient not taking: Reported on 11/10/2023 01/19/23   Josette Ade, MD  levothyroxine  (SYNTHROID ) 125 MCG tablet Take 125 mcg by mouth daily before breakfast.    [provider]  lidocaine  (LIDODERM ) 5 % Place 1 patch onto the skin every 12 (twelve) hours. Remove & Discard patch within 12 hours or as directed by MD 12/10/23 12/09/24  Claudene Rover, MD  lipase/protease/amylase (CREON ) 36000 UNITS CPEP capsule Take 1 capsule (36,000 Units total)  by mouth 3 (three) times daily before meals. Patient not taking: Reported on 11/10/2023 01/19/23   Josette Ade, MD  methocarbamol  (ROBAXIN ) 500 MG tablet Take 1 tablet (500 mg total) by mouth every 8 (eight) hours as needed. 12/10/23   Claudene Rover, MD  metolazone  (ZAROXOLYN ) 2.5 MG tablet TAKE 1 TABLET BY MOUTH ONCE A WEEK ON  MONDAYS 12/10/23   Donette Ellouise A, FNP  polyethylene glycol (MIRALAX  / GLYCOLAX ) 17 g packet Take 17 g by mouth daily as needed for mild constipation. 01/19/23   Josette Ade, MD  torsemide  (DEMADEX ) 20 MG tablet Take 2 tablets (40 mg total) by mouth daily. 07/01/23 11/10/23  Lanetta Lingo, MD    Physical Exam: Vitals:   12/16/23 1932 12/16/23 1934 12/16/23 2042 12/16/23 2110  BP: (!) 107/92  (!) 139/127   Pulse:  83 76   Resp:  18 20   Temp:   (!) 97.5 F (36.4 C)   TempSrc:      SpO2:  94% 99%   Weight:    134.5 kg  Height:    5' 11 (1.803 m)   Constitutional: Obese, chronically ill looking, NAD, calm, comfortable Eyes: PERRL, lids and conjunctivae normal ENMT: Mucous membranes are moist. Posterior pharynx clear of any exudate or lesions.Normal dentition.  Neck: normal, supple, no masses, no thyromegaly Respiratory: clear to auscultation bilaterally, no wheezing, no crackles. Normal respiratory effort. No accessory muscle use.  Cardiovascular: Regular rate and rhythm, no murmurs / rubs / gallops.  Trace extremity edema. 2+ pedal pulses. No carotid bruits.  Abdomen: Markedly distended abdomen, no palpable organomegaly, soft, no evidence of fluid,, no masses palpated. No hepatosplenomegaly. Bowel sounds positive.  Musculoskeletal: Good range of motion, no joint swelling or tenderness, Skin: no rashes, lesions, ulcers. No induration Neurologic: CN 2-12 grossly intact. Sensation intact, DTR normal. Strength 5/5 in all 4.  Psychiatric: Normal judgment and insight. Alert and oriented x 3. Normal mood  Data Reviewed:  Temperature 97.5, blood pressure  107/92, white count 7.0, hemoglobin 14 point sodium 139 and potassium 3.7.  BUN is 71 creatinine 5.78 calcium  8.7 troponin 135.  BNP is 538.Urinalysis showed hazy urine with large leukocytes.  WBC more than 50 with rare bacteria.  CT abdomen pelvis shows no acute findings.  There is minimal cholelithiasis.  Moderate diverticulosis throughout the colon and multiple bilateral hypo and hyperdense renal cysts which is unchanged from previous.  Chest x-ray showed cardiomegaly with vascular congestion and edema with right perihilar opacity which could be edema or pneumonia.  Assessment and Plan:  #1 abdominal pain with distention: Patient appears to be having worsening cardiac as well as renal function.  This could reflect some type  of cardiorenal syndrome.  His CT did show not show any significant ascites.  We will address underlying problems.  More than likely will need to get both cardiology and nephrology involved.  #2 heart failure with reduced ejection fraction: Patient is on Aldactone  and torsemide .  His EF is only 20% from previous echo.  With the worsening renal function would likely hold diuretics tonight.  Get cardiology and nephrology involved to help with his fluid management.  #3 acute on chronic kidney disease stage IV: Worsening renal function.  Patient with EF of 20% therefore not sure he would be a good candidate for any hemodialysis.  Will consult nephrology in the morning for further guidance.  #4 history of alcoholic cardiomyopathy: Patient has quit drinking he said.  At this point we will continue to monitor.  If there is any sign of alcohol withdrawal will start CIWA protocol  #5 paroxysmal atrial fibrillation: Patient on chronic anticoagulation.  His rate is controlled.  Will continue his home regimen  #6 COPD: No acute exacerbation.  Continue to monitor  #7 hyperlipidemia: Confirm and continue statin  #8 essential hypertension: Blood pressure is controlled on home regimen.   Holding diuretics  #9 suspected UTI: Based on urinalysis.  Empirically start Rocephin .  Also suspected pneumonia on x-ray.  #10 diabetes: Not on any medication.  Patient has reported hyperglycemia with diabetes.  Follow A1c.  Start very low-dose and very sensitive sliding scale    Advance Care Planning:   Code Status: Full Code   Consults: Will consult cardiology and nephrology in the morning  Family Communication: No family at bedside  Severity of Illness: The appropriate patient status for this patient is INPATIENT. Inpatient status is judged to be reasonable and necessary in order to provide the required intensity of service to ensure the patient's safety. The patient's presenting symptoms, physical exam findings, and initial radiographic and laboratory data in the context of their chronic comorbidities is felt to place them at high risk for further clinical deterioration. Furthermore, it is not anticipated that the patient will be medically stable for discharge from the hospital within 2 midnights of admission.   * I certify that at the point of admission it is my clinical judgment that the patient will require inpatient hospital care spanning beyond 2 midnights from the point of admission due to high intensity of service, high risk for further deterioration and high frequency of surveillance required.*  AuthorBETHA SIM KNOLL, MD 12/16/2023 9:51 PM  For on call review [email or web address].

## 2023-12-16 NOTE — Progress Notes (Deleted)
 ADVANCED HF CLINIC NOTE   Primary Care: [name], MD Primary Cardiologist: Evalene Lunger, MD  HP provider: Cherrie Sieving, MD   Chief Complaint: shortness of breath    HPI:  Hector [name] is a 58 y.o. male with ETOH use, DM2, morbid obesity, PAF (h/o GIB on apixaban ),  OSA on CPAP, CKD stage IV (Scr ~3.3), medication noncompliance.    HF dates back to at least 2009, thought to presumably be nonischemic in the setting of alcohol use, with an EF of 20%.     Echo 2016 EF 20-25%. LHC 12/2014 minimal CAD   EF initially improved with restoration of NSR but then dropped back down    He underwent TEE/DCCV in 02/2021 at that time showed an EF of less than 20% with severely reduced RV systolic function.  He was reevaluated by EP in 04/2021 and felt not to be a good candidate for ICD or ablation given medication noncompliance.   Admitted in 9/23 & 10/23 with ADHF. Limited echo 10/23 EF <  20% with moderate RV dysfunction and moderate to sever MR.  He was discharged from the hospital on 12/12/2021 with a weight of 247 pounds.     Admitted 11/23, 12/23, 1/24 and 3/24 with recurrent HF.   In 3/24 underwent TEE-guided DCCV as well as RHC. TEE EF < 20%, mod RV HK, mod MR. RHC with markedly elevated filling pressures and low CO. RA 17, PA 58/39 (43), PCWP 33, Fick 3.4/1.5 (PA sat 49%), PAPi 1.2.   Seen in ER 05/18/22 with recurrent volume overload. Lasix  switched to torsemide  and metolazone  added.  Admitted 01/16/23 due to abdominal pain and found to be tachycardic. CT scan of the abdomen without contrast did not find any findings in the abdomen or pelvis to explain the patient's abdominal pain, cholelithiasis, renal cysts. Worsening renal function so diuretic held and IVF given. Cardiology and nephrology consults done. Was in atrial flutter with successful cardioversion. Given amiodarone .   Echo 01/19/23: EF <20% with mild MR, no thrombus  Admitted 07/28/23 with syncope. Found to be  in a fib RVR. VQ + for possible RLL PE. Resp panel + for coronavirus OC43. Echo 07/29/23 EF 20-25% RV ok. Loaded with IV amiodarone . Refused lifevest, no driving for 6 months. Cardioverted to NSR. TEE 07/30/23: EF <20%, severely reduced RV, no thrombus, mild RAE. SCr >4 at discharge. GDMT limited with renal function and hyperkalemia. Was supposed to discharge with a Zio but failed to get it placed prior to d/c.   Seen in [facility] 06/25 and was given an extra 2.5mg  metolazone  due to edema.  Patient refused lifevest.   Zio 6/25: min HR of 58 bpm, max HR of 171 bpm, and avg HR of 77 bpm. Predominant underlying rhythm was Sinus Rhythm. Bundle Branch Block/IVCD was present. 2 Supraventricular Tachycardia runs occurred, the run with the fastest interval lasting 17.9 secs with a max rate of 171 bpm (avg 122 bpm); the run with the fastest interval was also the longest. Isolated SVEs were occasional (2.2%, 11891), SVE Couplets were rare (<1.0%, 140), and SVE Triplets were rare (<1.0%, 8). Isolated VEs were frequent (6.2%, 34299), VE Couplets were rare (<1.0%, 861), and VE Triplets were rare (<1.0%, 85). Ventricular Bigeminy and Trigeminy were present.   Today he returns for HF follow up.Overall feeling fine. Denies SOB/PND/Orthopnea. Appetite ok. No fever or chills. Weight at home  pounds. Taking all medications.   ROS: All systems negative except what is listed in HPI,  PMH and Problem List   Past Medical History:  Diagnosis Date   Alcohol abuse    Alcoholic cardiomyopathy (HCC)    a. 12/2007 MV: EF 28%, no isch;  b. 8/12 Echo: EF 25-35%; c. 02/2014 Echo: EF 20-25%; d. 12/2014 Cath: minimal CAD; e. 01/2015 Echo: EF 50-55%;  d. 05/2016 Echo: EF 30-35%, diff HK, gr2 DD; e. 11/2016 Echo: EF 45-50%, diff HK; f. 09/2021 Echo: EF <20%; g. 11/2021 Echo: EF<20%.   Chronic combined systolic (congestive) and diastolic (congestive) heart failure (HCC)    a. 05/2016 Echo: EF 30-35%; b. 11/2016 Echo: EF 45-50%, diff HK; c. 09/2021  Echo: EF < 20%; d. 11/2021 Echo: EF <20%, no rwma, Nl RV fxn, sev dil LA, mod-sev MR. mod dil PA w/ mildly elev PASP.   CKD (chronic kidney disease), stage III (HCC)    Elevated troponin (chronic)    Essential hypertension    GI bleed 11/2013   Hyperlipidemia    Pancreatitis    Paroxysmal A-fib (HCC)    a. new onset s/p unsuccessful TEE/DCCV on 08/16/2014; b. CHA2DS2VASc = 3-> eliquis  (freq noncompliant); c. 02/2021 s/p TEE/DCCV; d. Prev on amio->d/c 2/2 abnl TFTs.   Paroxysmal atrial flutter (HCC)    Sleep-disordered breathing    Has yet to have a sleep study   Stroke [facility])     Current Outpatient Medications  Medication Sig Dispense Refill   allopurinol  (ZYLOPRIM ) 100 MG tablet Take 100 mg by mouth daily.     amiodarone  (PACERONE ) 200 MG tablet Take 1 tablet (200 mg total) by mouth daily. 90 tablet 1   apixaban  (ELIQUIS ) 5 MG TABS tablet Take 2 tabs twice a day until 6/14, then take 1 tab twice a day 180 tablet 3   atorvastatin  (LIPITOR ) 80 MG tablet Take 1 tablet (80 mg total) by mouth daily. (Patient not taking: Reported on 11/10/2023) 90 tablet 1   calcitRIOL (ROCALTROL) 0.25 MCG capsule Take 0.25 mcg by mouth 3 (three) times a week. (Patient not taking: Reported on 11/10/2023)     empagliflozin  (JARDIANCE ) 10 MG TABS tablet Take 1 tablet (10 mg total) by mouth daily. 90 tablet 5   gabapentin  (NEURONTIN ) 100 MG capsule Take 1 capsule (100 mg total) by mouth 2 (two) times daily. (Patient not taking: Reported on 11/10/2023) 60 capsule 0   levothyroxine  (SYNTHROID ) 125 MCG tablet Take 125 mcg by mouth daily before breakfast.     lidocaine  (LIDODERM ) 5 % Place 1 patch onto the skin every 12 (twelve) hours. Remove & Discard patch within 12 hours or as directed by MD 10 patch 1   lipase/protease/amylase (CREON ) 36000 UNITS CPEP capsule Take 1 capsule (36,000 Units total) by mouth 3 (three) times daily before meals. (Patient not taking: Reported on 11/10/2023) 180 capsule 0   methocarbamol   (ROBAXIN ) 500 MG tablet Take 1 tablet (500 mg total) by mouth every 8 (eight) hours as needed. 30 tablet 0   metolazone  (ZAROXOLYN ) 2.5 MG tablet TAKE 1 TABLET BY MOUTH ONCE A WEEK ON  MONDAYS 5 tablet 0   polyethylene glycol (MIRALAX  / GLYCOLAX ) 17 g packet Take 17 g by mouth daily as needed for mild constipation. 30 each 0   torsemide  (DEMADEX ) 20 MG tablet Take 2 tablets (40 mg total) by mouth daily. 60 tablet 0   No current facility-administered medications for this visit.   Allergies  Allergen Reactions   Esomeprazole Magnesium  Other (See Comments)    Suspected interstitial nephritis 2018   Egg Protein-Containing Drug  Products Rash   Social History   Socioeconomic History   Marital status: Single    Spouse name: Not on file   Number of children: Not on file   Years of education: Not on file   Highest education level: Not on file  Occupational History   Not on file  Tobacco Use   Smoking status: Former    Current packs/day: 0.00    Average packs/day: 1 pack/day for 12.0 years (12.0 ttl pk-yrs)    Types: Cigarettes    Start date: 02/23/1986    Quit date: 02/23/1998    Years since quitting: 25.8   Smokeless tobacco: Never  Vaping Use   Vaping status: Never Used  Substance and Sexual Activity   Alcohol use: Not Currently    Alcohol/week: 0.0 standard drinks of alcohol    Comment: Past heavy drinker   Drug use: No   Sexual activity: Not Currently  Other Topics Concern   Not on file  Social History Narrative   Not on file   Social Drivers of Health   Financial Resource Strain: Low Risk  (01/19/2023)   Overall Financial Resource Strain (CARDIA)    Difficulty of Paying Living Expenses: Not hard at all  Food Insecurity: No Food Insecurity (07/30/2023)   Hunger Vital Sign    Worried About Running Out of Food in the Last Year: Never true    Ran Out of Food in the Last Year: Never true  Transportation Needs: No Transportation Needs (07/30/2023)   PRAPARE - Therapist, art (Medical): No    Lack of Transportation (Non-Medical): No  Physical Activity: Inactive (01/04/2019)   Exercise Vital Sign    Days of Exercise per Week: 0 days    Minutes of Exercise per Session: 0 min  Stress: No Stress Concern Present (01/04/2019)   [employer] of Occupational Health - Occupational Stress Questionnaire    Feeling of Stress : Not at all  Social Connections: Moderately Isolated (01/04/2019)   Social Connection and Isolation Panel    Frequency of Communication with Friends and Family: More than three times a week    Frequency of Social Gatherings with Friends and Family: More than three times a week    Attends Religious Services: 1 to 4 times per year    Active Member of [employer] or Organizations: No    Attends Banker Meetings: Never    Marital Status: Never married  Intimate Partner Violence: Not At Risk (07/30/2023)   Humiliation, Afraid, Rape, and Kick questionnaire    Fear of Current or Ex-Partner: No    Emotionally Abused: No    Physically Abused: No    Sexually Abused: No   Family History  Problem Relation Age of Onset   Hypertension Mother    Hyperlipidemia Mother    Diabetes Mother    There were no vitals filed for this visit.  Wt Readings from Last 3 Encounters:  12/10/23 273 lb (123.8 kg)  11/10/23 285 lb (129.3 kg)  11/04/23 286 lb (129.7 kg)   Lab Results  Component Value Date   CREATININE 3.74 (H) 08/06/2023   CREATININE 4.09 (H) 08/02/2023   CREATININE 4.32 (H) 08/01/2023    PHYSICAL EXAM: General:   No resp difficulty Neck: no JVD.  Cor: Regular rate & rhythm.  Lungs: clear Abdomen: soft, nontender, nondistended.  Extremities: no  edema Neuro: alert & oriented x3  ASSESSMENT & PLAN:  1. 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  - Continue Jardiance  10 mg daily - Continue metolazone  2.5 mg every Monday.  - Continue Torsemide  40 mg daily.   2. Paroxysmal atrial fibrillation/atrial flutter- - continue apixaban  5 mg twice daily for CHA2DS2-VASc score of at least 6. Denies abnormal bleeding - continue amio 200mg  daily  - TEE/DC-CV 3/24  - DC-CV 11/24 -  DC-CV 6/25 - TSH 07/28/23 was 39.399. Synthroid  dose managed by PCP - needs yearly eye exams  CKD stage IV- - Baseline Scr 2.8-3.4. recently up to 4 following last admission.  - Avoid nephrotoxic agents where able - Not HD candidate with severe HF    4. Nonobstructive coronary artery disease- - Cath 2016 - continue atorvastatin  80mg  daily   5. Obstructive sleep apnea- - last sleep study done 12/2015 - wearing CPAP nightly   6. Type 2 diabetes- -A1c 7.7% on 01/17/23 -Continue SGLT2i   7. Recent Syncopal episode x 2 (06/25) - suspect was related to AFL but also at high-risk for VT. VQ + for PE - Continue amiodarone  200 mg daily - no driving x 6 months - has previously not been an ICD candidate due to poor compliance -He cancelled last 2 EP appointments.    Follow up in ***     Autrey Human  NP-C 12/16/23

## 2023-12-17 DIAGNOSIS — N179 Acute kidney failure, unspecified: Secondary | ICD-10-CM

## 2023-12-17 DIAGNOSIS — N189 Chronic kidney disease, unspecified: Secondary | ICD-10-CM | POA: Diagnosis not present

## 2023-12-17 LAB — RENAL FUNCTION PANEL
Albumin: 3.4 g/dL — ABNORMAL LOW (ref 3.5–5.0)
Anion gap: 12 (ref 5–15)
BUN: 72 mg/dL — ABNORMAL HIGH (ref 6–20)
CO2: 25 mmol/L (ref 22–32)
Calcium: 8.4 mg/dL — ABNORMAL LOW (ref 8.9–10.3)
Chloride: 101 mmol/L (ref 98–111)
Creatinine, Ser: 5.66 mg/dL — ABNORMAL HIGH (ref 0.61–1.24)
GFR, Estimated: 11 mL/min — ABNORMAL LOW (ref >60)
Glucose, Bld: 86 mg/dL (ref 70–99)
Phosphorus: 4.6 mg/dL (ref 2.5–4.6)
Potassium: 3.5 mmol/L (ref 3.5–5.1)
Sodium: 138 mmol/L (ref 135–145)

## 2023-12-17 LAB — BLOOD CULTURE ID PANEL (REFLEXED) - BCID2

## 2023-12-17 LAB — CBC
HCT: 41.3 % (ref 39.0–52.0)
Hemoglobin: 13.4 g/dL (ref 13.0–17.0)
MCH: 28.2 pg (ref 26.0–34.0)
MCHC: 32.4 g/dL (ref 30.0–36.0)
MCV: 86.8 fL (ref 80.0–100.0)
Platelets: 203 K/uL (ref 150–400)
RBC: 4.76 MIL/uL (ref 4.22–5.81)
RDW: 17.5 % — ABNORMAL HIGH (ref 11.5–15.5)
WBC: 6.9 K/uL (ref 4.0–10.5)
nRBC: 0 % (ref 0.0–0.2)

## 2023-12-17 LAB — URINE CULTURE: Culture: <10000 — AB

## 2023-12-17 MED ORDER — MORPHINE SULFATE (PF) 2 MG/ML IV SOLN
2.0000 mg | INTRAVENOUS | Status: DC | PRN
  Administered 2023-12-17 – 2023-12-19 (×7): 2 mg via INTRAVENOUS
  Filled 2023-12-17 (×7): qty 1

## 2023-12-17 NOTE — Plan of Care (Signed)
 Patient is not cooperative, patient educated and refused to be swab for MRSA per protocol as patient has a history of MRSA from march and blood glucose checks, when asked why patient refused patient stated I don't have no MRSA and you're not sticking with me any needles I don't have diabetes MD notified. Alert and oriented x4. Clear lung sounds, on room air no noted cough. Patient wearing his heart monitor from home. Abdomen soft and rounded, last bowel movement 10/20, +bowel sounds, passing flatus per patient, patient refused any bowel regimen medications. Voiding without any difficulty. +CMS, able to feel sensation, patient able to wiggle toes and fingers,+dorsi/plantar flex, denies numbness or tingling. Tolerating diet. Ambulating independently out of bed, patient educated and refused nonskid footwear. Hourly rounding performed, fall precautions maintained, and call bell within reach.   Problem: Education: Goal: Knowledge of General Education information will improve Description: Including pain rating scale, medication(s)/side effects and non-pharmacologic comfort measures Outcome: Progressing   Problem: Health Behavior/Discharge Planning: Goal: Ability to manage health-related needs will improve Outcome: Progressing   Problem: Clinical Measurements: Goal: Ability to maintain clinical measurements within normal limits will improve Outcome: Progressing Goal: Will remain free from infection Outcome: Progressing Goal: Diagnostic test results will improve Outcome: Progressing Goal: Respiratory complications will improve Outcome: Progressing Goal: Cardiovascular complication will be avoided Outcome: Progressing   Problem: Activity: Goal: Risk for activity intolerance will decrease Outcome: Progressing   Problem: Nutrition: Goal: Adequate nutrition will be maintained Outcome: Progressing   Problem: Coping: Goal: Level of anxiety will decrease Outcome: Progressing   Problem:  Elimination: Goal: Will not experience complications related to bowel motility Outcome: Progressing Goal: Will not experience complications related to urinary retention Outcome: Progressing   Problem: Pain Managment: Goal: General experience of comfort will improve and/or be controlled Outcome: Progressing   Problem: Safety: Goal: Ability to remain free from injury will improve Outcome: Progressing   Problem: Skin Integrity: Goal: Risk for impaired skin integrity will decrease Outcome: Progressing   Problem: Education: Goal: Ability to describe self-care measures that may prevent or decrease complications (Diabetes Survival Skills Education) will improve Outcome: Progressing Goal: Individualized Educational Video(s) Outcome: Progressing   Problem: Coping: Goal: Ability to adjust to condition or change in health will improve Outcome: Progressing   Problem: Fluid Volume: Goal: Ability to maintain a balanced intake and output will improve Outcome: Progressing   Problem: Health Behavior/Discharge Planning: Goal: Ability to identify and utilize available resources and services will improve Outcome: Progressing Goal: Ability to manage health-related needs will improve Outcome: Progressing   Problem: Metabolic: Goal: Ability to maintain appropriate glucose levels will improve Outcome: Progressing   Problem: Nutritional: Goal: Maintenance of adequate nutrition will improve Outcome: Progressing Goal: Progress toward achieving an optimal weight will improve Outcome: Progressing   Problem: Skin Integrity: Goal: Risk for impaired skin integrity will decrease Outcome: Progressing   Problem: Tissue Perfusion: Goal: Adequacy of tissue perfusion will improve Outcome: Progressing

## 2023-12-17 NOTE — Consult Note (Signed)
 PHARMACY - PHYSICIAN COMMUNICATION CRITICAL VALUE ALERT - BLOOD CULTURE IDENTIFICATION (BCID)  Hector [name] is an 58 y.o. male who presented to [facility] on 12/16/2023 with a chief complaint of acute on chronic renal failure and UTI.  Assessment:  1/4 GPC (anaerobic only), BCID = staph species with no resistance genes detected.  Name of physician (or Provider) Contacted: Dr. Dorinda  Current antibiotics: ceftriaxone   Changes to prescribed antibiotics recommended:  No additional coverage at this time, provider feels this is likely contaminant.  Results for orders placed or performed during the hospital encounter of 12/16/23  Blood Culture ID Panel (Reflexed) (Collected: 12/16/2023 10:45 PM)  Result Value Ref Range   Enterococcus faecalis NOT DETECTED NOT DETECTED   Enterococcus Faecium NOT DETECTED NOT DETECTED   Listeria monocytogenes NOT DETECTED NOT DETECTED   Staphylococcus species DETECTED (A) NOT DETECTED   Staphylococcus aureus (BCID) NOT DETECTED NOT DETECTED   Staphylococcus epidermidis NOT DETECTED NOT DETECTED   Staphylococcus lugdunensis NOT DETECTED NOT DETECTED   Streptococcus species NOT DETECTED NOT DETECTED   Streptococcus agalactiae NOT DETECTED NOT DETECTED   Streptococcus pneumoniae NOT DETECTED NOT DETECTED   Streptococcus pyogenes NOT DETECTED NOT DETECTED   A.calcoaceticus-baumannii NOT DETECTED NOT DETECTED   Bacteroides fragilis NOT DETECTED NOT DETECTED   Enterobacterales NOT DETECTED NOT DETECTED   Enterobacter cloacae complex NOT DETECTED NOT DETECTED   Escherichia coli NOT DETECTED NOT DETECTED   Klebsiella aerogenes NOT DETECTED NOT DETECTED   Klebsiella oxytoca NOT DETECTED NOT DETECTED   Klebsiella pneumoniae NOT DETECTED NOT DETECTED   Proteus species NOT DETECTED NOT DETECTED   Salmonella species NOT DETECTED NOT DETECTED   Serratia marcescens NOT DETECTED NOT DETECTED   Haemophilus influenzae NOT DETECTED NOT DETECTED   Neisseria  meningitidis NOT DETECTED NOT DETECTED   Pseudomonas aeruginosa NOT DETECTED NOT DETECTED   Stenotrophomonas maltophilia NOT DETECTED NOT DETECTED   Candida albicans NOT DETECTED NOT DETECTED   Candida auris NOT DETECTED NOT DETECTED   Candida glabrata NOT DETECTED NOT DETECTED   Candida krusei NOT DETECTED NOT DETECTED   Candida parapsilosis NOT DETECTED NOT DETECTED   Candida tropicalis NOT DETECTED NOT DETECTED   Cryptococcus neoformans/gattii NOT DETECTED NOT DETECTED    [name] 12/17/2023  4:23 PM

## 2023-12-17 NOTE — Progress Notes (Signed)
 [facility]  ROUNDING NOTE   Subjective:   Hector [name] is a 58 y.o. male with past medical conditions including hypertension, paroxysmal A-fib on Eliquis , CHF with a EF less than 20%, and chronic kidney disease stage IV. Patient presents to ED with abdominal and back pain.  Patient currently admitted for Abdominal distention [R14.0] Elevated troponin [R79.89] AKI (acute kidney injury) [N17.9] Acute on chronic congestive heart failure, unspecified heart failure type [facility]) [I50.9]  Patient known to our practice from previous admissions and is followed by [facility] nephrology outpatient.  Patient states he began to noticed increased abdominal pain and swelling over the past 1 to 2 weeks.  Patient does report taking all medications as prescribed.  States he recently saw his nephrologist earlier this week.  Is seen resting in bed.  Currently on room air with no lower extremity edema.  Abdomen is distended but soft.  Does complain of mild discomfort with light palpation on the right side.  Labs on ED arrival concerning for BUN 71, creatinine 5.78 with GFR 11, calcium  8.7, BNP 538, troponin 135.  UA appears hazy with leukocytes and proteinuria.  Urine culture pending.  Blood cultures pending.  CT abdomen pelvis showed no acute findings, minimal cholelithiasis with moderate diverticulosis without active infection.  We have been consulted to assist in management of acute kidney injury.   Objective:  Vital signs in last 24 hours:  Temp:  [97.5 F (36.4 C)-97.9 F (36.6 C)] 97.5 F (36.4 C) (10/24 0820) Pulse Rate:  [71-93] 93 (10/24 0820) Resp:  [16-20] 16 (10/24 0820) BP: (107-139)/(83-127) 107/83 (10/24 0820) SpO2:  [93 %-100 %] 93 % (10/24 0820) Weight:  [134.5 kg-135 kg] 135 kg (10/24 0500)  Weight change:  Filed Weights   12/16/23 2110 12/17/23 0500  Weight: 134.5 kg 135 kg    Intake/Output: I/O last 3 completed shifts: In: 460 [P.O.:360; IV Piggyback:100] Out: 100  [Urine:100]   Intake/Output this shift:  No intake/output data recorded.  Physical Exam: General: NAD, ill-appearing  Head: Normocephalic, atraumatic. Moist oral mucosal membranes  Eyes: Anicteric  Lungs:  Clear to auscultation, normal effort  Heart: Regular rate and rhythm  Abdomen:  Soft, tender with light palpation, distended  Extremities: No peripheral edema.  Neurologic: Awake, alert, conversant  Skin: Warm,dry, no rash   Basic Metabolic Panel: Recent Labs  Lab 12/16/23 1230 12/17/23 0301  NA 139 138  K 3.7 3.5  CL 98 101  CO2 25 25  GLUCOSE 130* 86  BUN 71* 72*  CREATININE 5.78* 5.66*  CALCIUM  8.7* 8.4*  PHOS  --  4.6    Liver Function Tests: Recent Labs  Lab 12/16/23 1230 12/17/23 0301  AST 192*  --   ALT 83*  --   ALKPHOS 76  --   BILITOT 1.7*  --   PROT 7.8  --   ALBUMIN  3.6 3.4*   Recent Labs  Lab 12/16/23 1230  LIPASE 26   No results for input(s): AMMONIA in the last 168 hours.  CBC: Recent Labs  Lab 12/16/23 1230 12/17/23 0301  WBC 7.0 6.9  HGB 14.0 13.4  HCT 44.3 41.3  MCV 87.0 86.8  PLT 222 203    Cardiac Enzymes: No results for input(s): CKTOTAL, CKMB, CKMBINDEX, TROPONINI in the last 168 hours.  BNP: Invalid input(s): POCBNP  CBG: No results for input(s): GLUCAP in the last 168 hours.  Microbiology: Results for orders placed or performed during the hospital encounter of 12/16/23  Culture, blood (  Routine X 2) w Reflex to ID Panel     Status: None (Preliminary result)   Collection Time: 12/16/23 10:45 PM   Specimen: BLOOD  Result Value Ref Range Status   Specimen Description BLOOD BLOOD RIGHT HAND  Final   Special Requests   Final    BOTTLES DRAWN AEROBIC AND ANAEROBIC Blood Culture adequate volume   Culture   Final    NO GROWTH < 12 HOURS Performed at [facility], [address] [place], [place] [address]    Report Status PENDING  Incomplete  Culture, blood (Routine X 2) w Reflex to ID Panel      Status: None (Preliminary result)   Collection Time: 12/16/23 10:45 PM   Specimen: BLOOD  Result Value Ref Range Status   Specimen Description BLOOD BLOOD LEFT ARM  Final   Special Requests   Final    BOTTLES DRAWN AEROBIC AND ANAEROBIC Blood Culture adequate volume   Culture   Final    NO GROWTH < 12 HOURS Performed at [facility], [address] [place], [place] [address]    Report Status PENDING  Incomplete    Coagulation Studies: No results for input(s): LABPROT, INR in the last 72 hours.  Urinalysis: Recent Labs    12/16/23 1856  COLORURINE YELLOW*  LABSPEC 1.012  PHURINE 7.0  GLUCOSEU >=500*  HGBUR SMALL*  BILIRUBINUR NEGATIVE  KETONESUR NEGATIVE  PROTEINUR >=300*  NITRITE NEGATIVE  LEUKOCYTESUR LARGE*      Imaging: DG Chest 2 View Result Date: 12/16/2023 CLINICAL DATA:  Shortness of breath. EXAM: CHEST - 2 VIEW COMPARISON:  Chest radiograph dated 07/28/2023. FINDINGS: There is cardiomegaly with vascular congestion and edema. Right perihilar opacity may represent edema or pneumonia. No pleural effusion or pneumothorax. No acute osseous pathology. IMPRESSION: 1. Cardiomegaly with vascular congestion and edema. 2. Right perihilar opacity may represent edema or pneumonia. Electronically Signed   By: Vanetta Chou M.D.   On: 12/16/2023 18:40   CT ABDOMEN PELVIS WO CONTRAST Result Date: 12/16/2023 CLINICAL DATA:  Abdominal pain and distension 2 weeks. EXAM: CT ABDOMEN AND PELVIS WITHOUT CONTRAST TECHNIQUE: Multidetector CT imaging of the abdomen and pelvis was performed following the standard protocol without IV contrast. RADIATION DOSE REDUCTION: This exam was performed according to the departmental dose-optimization program which includes automated exposure control, adjustment of the mA and/or kV according to patient size and/or use of iterative reconstruction technique. COMPARISON:  CT 06/21/2023, 09/26/2022 as well as MRI 09/26/2022 FINDINGS: Lower  chest: Borderline stable cardiomegaly. Visualized lung bases demonstrate no acute process. Calcified granuloma over the right lower lobe. Hepatobiliary: Minimal cholelithiasis. Liver and biliary tree are normal. Pancreas: Mild fatty a atrophy of the pancreas which is otherwise unremarkable. Spleen: Normal. Adrenals/Urinary Tract: Adrenal glands are normal. Kidneys are normal in size without hydronephrosis or nephrolithiasis. Multiple bilateral hypo and hyperdense cysts unchanged and demonstrated on previous MRI 2023. Ureters are normal. Small diverticulum over the dome of the bladder which is otherwise unremarkable. Stomach/Bowel: Stomach and small bowel are normal. Appendix is normal. Moderate diverticulosis throughout the colon without active inflammation. Moderate fecal retention over the rectum. Vascular/Lymphatic: Minimal calcified plaque over the abdominal aorta which is normal caliber. Remaining vascular structures are unremarkable. No adenopathy. Reproductive: Prostate is unremarkable. Other: No free fluid or focal inflammatory change. Tiny umbilical hernia containing only peritoneal fat unchanged. Musculoskeletal: No focal abnormality. IMPRESSION: 1. No acute findings in the abdomen/pelvis. 2. Minimal cholelithiasis. 3. Moderate diverticulosis throughout the colon without active inflammation.  4. Multiple bilateral hypo and hyperdense renal cysts unchanged and demonstrated on previous MRI 2023. 5. Aortic atherosclerosis. Aortic Atherosclerosis (ICD10-I70.0). Electronically Signed   By: Toribio Agreste M.D.   On: 12/16/2023 15:03     Medications:    sodium chloride  10 mL/hr at 12/16/23 2327   cefTRIAXone  (ROCEPHIN )  IV 1 g (12/16/23 2328)    amiodarone   200 mg Oral Daily   apixaban   5 mg Oral BID   aspirin   324 mg Oral Once   insulin  aspart  0-5 Units Subcutaneous QHS   insulin  aspart  0-6 Units Subcutaneous TID WC   levothyroxine   125 mcg Oral QAC breakfast   sodium chloride , acetaminophen   **OR** acetaminophen , calcium  carbonate (dosed in mg elemental calcium ), camphor-menthol  **AND** hydrOXYzine, feeding supplement (NEPRO CARB STEADY), morphine  injection, ondansetron  **OR** ondansetron  (ZOFRAN ) IV, sorbitol  Assessment/ Plan:  [name] is a 58 y.o.  male with past medical conditions including hypertension, paroxysmal A-fib on Eliquis , CHF with a EF less than 20%, and chronic kidney disease stage IV.  Patient currently admitted for Abdominal distention [R14.0] Elevated troponin [R79.89] AKI (acute kidney injury) [N17.9] Acute on chronic congestive heart failure, unspecified heart failure type (HCC) [I50.9]   Acute Kidney Injury on chronic kidney disease stage 4 with baseline creatinine 3.90 and GFR of 17 on 09/13/23.  Acute kidney injury workup remains in progress.  Patient has had multiple episodes of acute kidney injury and this may represent progression towards end-stage renal disease. Chronic kidney disease is secondary to hypertension and CAD.  No recent IV contrast exposure.  Prescribed diuretics outpatient, currently held.  Has received one-time dose IV furosemide .  CT abdomen pelvis negative for obstruction.  Will continue supportive measures and monitoring of renal function.  Patient is agreeable to proceed with hemodialysis if needed.  Will continue to monitor for now.   Lab Results  Component Value Date   CREATININE 5.66 (H) 12/17/2023   CREATININE 5.78 (H) 12/16/2023   CREATININE 3.74 (H) 08/06/2023    Intake/Output Summary (Last 24 hours) at 12/17/2023 1332 Last data filed at 12/17/2023 0100 Gross per 24 hour  Intake 460 ml  Output 100 ml  Net 360 ml    2. Anemia of chronic kidney disease Lab Results  Component Value Date   HGB 13.4 12/17/2023    Hemoglobin acceptable for renal patient, will continue to monitor  3. Secondary Hyperparathyroidism: with outpatient labs: PTH 1207, phosphorus 5.6, calcium  9.4 on 12/07/2023.   Lab Results   Component Value Date   CALCIUM  8.4 (L) 12/17/2023   CAION 1.13 (L) 06/23/2023   CAION 1.10 (L) 06/23/2023   PHOS 4.6 12/17/2023    PTH elevated, will continue to monitor bone minerals during this admission.  4. 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 Home regimen includes glipizide. Most recent hemoglobin A1c is 7.3 on 06/28/2023.     LOS: 1 Hector [name] 10/24/20251:32 PM

## 2023-12-17 NOTE — Progress Notes (Signed)
 Progress Note   Patient: Hector [name] FMW:[id number] DOB: April 29, 1965 DOA: 12/16/2023     1 DOS: the patient was seen and examined on 12/17/2023   Brief hospital course: From HPI Hector [name] is a 58 y.o. male with medical history significant of chronic kidney disease stage IV, heart failure with reduced ejection fraction with a EF of 20 to 25%, diabetes, essential hypertension, hyperlipidemia, paroxysmal atrial fibrillation, alcoholic cardiomyopathy among other things who presented to the ER complaining of provide 2 weeks of progressive abdominal pain and distention.  He was having back pain and was seen here just last week in the ER.  He was treated for back pain and sent home.  It is not clear how much compliance patient has had.  Today however is noted to have markedly distended abdomen with shifting dullness.  Only trace pedal edema.  He is not requiring oxygen.  His renal function however has worsened with creatinine going from 3.74 previously to 5.78.  His GFR has significantly worsened.  Patient also had elevated troponin of 135 at the first set.  His urinalysis is worrisome for possible UTI patient is being admitted with acute on chronic kidney disease stage IV as well as possible UTI.  No obstruction on CT abdomen.    Assessment and Plan:   Acute on chronic congestive heart failure with reduced ejection fraction:  Patient is on Aldactone  and torsemide .  His EF is only 20% from previous echo.  With the worsening renal function would likely hold diuretics tonight.   Nephrology following Monitor input and output   Abdominal pain with distention: Patient appears to be having worsening cardiac as well as renal function.  This could reflect some type of cardiorenal syndrome.  His CT did show not show any significant ascites.    Acute on chronic kidney disease stage IV:  Worsening renal function.  Patient with EF of 20% therefore not sure he would be a good candidate for any  hemodialysis.  Nephrology is consulted we appreciate input   History of alcoholic cardiomyopathy:  Patient has quit drinking he said.  At this point we will continue to monitor.  If there is any sign of alcohol withdrawal will start CIWA protocol   Paroxysmal atrial fibrillation:  Patient on chronic anticoagulation.  His rate is controlled.  Will continue his home regimen   COPD:  No acute exacerbation.  Continue to monitor   Hyperlipidemia:  Confirm and continue statin   Essential hypertension:  Blood pressure is controlled on home regimen.  Holding diuretics   Possible UTI Continue Rocephin    Type 2 diabetes:  Follow-up on glucose monitoring Continue insulin    Subjective:  Patient seen and examined at bedside this morning Denies nausea vomiting abdominal pain chest pain  Physical Exam:  Constitutional: Obese, chronically ill looking, NAD, calm, comfortable Eyes: PERRL, lids and conjunctivae normal ENMT: Mucous membranes are moist. Posterior pharynx clear of any exudate or lesions.Normal dentition.  Neck: normal, supple, no masses, no thyromegaly Respiratory: clear to auscultation bilaterally, no wheezing, no crackles. Normal respiratory effort. No accessory muscle use.  Cardiovascular: Regular rate and rhythm, no murmurs / rubs / gallops.  Trace extremity edema. 2+ pedal pulses. No carotid bruits.  Abdomen: Markedly distended abdomen, no palpable organomegaly, soft, no evidence of fluid,, no masses palpated. No hepatosplenomegaly. Bowel sounds positive.  Musculoskeletal: Good range of motion, no joint swelling or tenderness, Skin: no rashes, lesions, ulcers. No induration Neurologic: CN 2-12 grossly intact. Sensation intact, DTR  normal. Strength 5/5 in all 4.  Psychiatric: Normal judgment and insight. Alert and oriented x 3. Normal mood    Vitals:   12/17/23 0449 12/17/23 0500 12/17/23 0820 12/17/23 1556  BP: 117/88  107/83 (!) 116/97  Pulse: 82  93 82  Resp: 19  16  17   Temp: 97.9 F (36.6 C)  (!) 97.5 F (36.4 C) 98.2 F (36.8 C)  TempSrc:      SpO2: 93%  93% 93%  Weight:  135 kg    Height:        Data Reviewed: I reviewed patient's chest x-ray showing showing cardiomegaly as well as vascular congestion    Latest Ref Rng & Units 12/17/2023    3:01 AM 12/16/2023   12:30 PM 08/06/2023    3:40 PM  BMP  Glucose 70 - 99 mg/dL 86  869  74   BUN 6 - 20 mg/dL 72  71  46   Creatinine 0.61 - 1.24 mg/dL 4.33  4.21  6.25   Sodium 135 - 145 mmol/L 138  139  139   Potassium 3.5 - 5.1 mmol/L 3.5  3.7  4.1   Chloride 98 - 111 mmol/L 101  98  103   CO2 22 - 32 mmol/L 25  25  24    Calcium  8.9 - 10.3 mg/dL 8.4  8.7  8.5     Disposition: Status is: Inpatient  Time spent: 52 minutes  Author: Drue ONEIDA Potter, MD 12/17/2023 4:23 PM  For on call review [email or web address].

## 2023-12-17 NOTE — Plan of Care (Signed)
  Problem: Education: Goal: Knowledge of General Education information will improve Description: Including pain rating scale, medication(s)/side effects and non-pharmacologic comfort measures Outcome: Progressing   Problem: Activity: Goal: Risk for activity intolerance will decrease Outcome: Progressing   Problem: Nutrition: Goal: Adequate nutrition will be maintained Outcome: Progressing   Problem: Skin Integrity: Goal: Risk for impaired skin integrity will decrease Outcome: Progressing   

## 2023-12-18 DIAGNOSIS — N189 Chronic kidney disease, unspecified: Secondary | ICD-10-CM | POA: Diagnosis not present

## 2023-12-18 DIAGNOSIS — N179 Acute kidney failure, unspecified: Secondary | ICD-10-CM | POA: Diagnosis not present

## 2023-12-18 LAB — CBC WITH DIFFERENTIAL/PLATELET
Abs Immature Granulocytes: 0.04 K/uL (ref 0.00–0.07)
Basophils Absolute: 0 K/uL (ref 0.0–0.1)
Basophils Relative: 1 %
Eosinophils Absolute: 0.1 K/uL (ref 0.0–0.5)
Eosinophils Relative: 2 %
HCT: 44.5 % (ref 39.0–52.0)
Hemoglobin: 14.1 g/dL (ref 13.0–17.0)
Immature Granulocytes: 1 %
Lymphocytes Relative: 17 %
Lymphs Abs: 1.1 K/uL (ref 0.7–4.0)
MCH: 27.8 pg (ref 26.0–34.0)
MCHC: 31.7 g/dL (ref 30.0–36.0)
MCV: 87.6 fL (ref 80.0–100.0)
Monocytes Absolute: 0.5 K/uL (ref 0.1–1.0)
Monocytes Relative: 7 %
Neutro Abs: 4.7 K/uL (ref 1.7–7.7)
Neutrophils Relative %: 72 %
Platelets: 204 K/uL (ref 150–400)
RBC: 5.08 MIL/uL (ref 4.22–5.81)
RDW: 17.7 % — ABNORMAL HIGH (ref 11.5–15.5)
WBC: 6.4 K/uL (ref 4.0–10.5)
nRBC: 0 % (ref 0.0–0.2)

## 2023-12-18 LAB — BASIC METABOLIC PANEL WITH GFR
Anion gap: 17 — ABNORMAL HIGH (ref 5–15)
BUN: 73 mg/dL — ABNORMAL HIGH (ref 6–20)
CO2: 24 mmol/L (ref 22–32)
Calcium: 8.9 mg/dL (ref 8.9–10.3)
Chloride: 95 mmol/L — ABNORMAL LOW (ref 98–111)
Creatinine, Ser: 5.89 mg/dL — ABNORMAL HIGH (ref 0.61–1.24)
GFR, Estimated: 10 mL/min — ABNORMAL LOW (ref >60)
Glucose, Bld: 125 mg/dL — ABNORMAL HIGH (ref 70–99)
Potassium: 4 mmol/L (ref 3.5–5.1)
Sodium: 136 mmol/L (ref 135–145)

## 2023-12-18 LAB — HEMOGLOBIN A1C
Hgb A1c MFr Bld: 7.4 % — ABNORMAL HIGH (ref 4.8–5.6)
Mean Plasma Glucose: 165.68 mg/dL

## 2023-12-18 LAB — HEPATITIS B SURFACE ANTIGEN: Hepatitis B Surface Ag: NONREACTIVE

## 2023-12-18 NOTE — Plan of Care (Signed)

## 2023-12-18 NOTE — Plan of Care (Signed)
  Problem: Education: Goal: Knowledge of General Education information will improve Description: Including pain rating scale, medication(s)/side effects and non-pharmacologic comfort measures Outcome: Progressing   Problem: Health Behavior/Discharge Planning: Goal: Ability to manage health-related needs will improve Outcome: Progressing   Problem: Activity: Goal: Risk for activity intolerance will decrease Outcome: Progressing   Problem: Elimination: Goal: Will not experience complications related to bowel motility Outcome: Progressing   Problem: Pain Managment: Goal: General experience of comfort will improve and/or be controlled Outcome: Progressing

## 2023-12-18 NOTE — Progress Notes (Signed)
 Education provided on fluid restriction, attempted to get nasal PRC and pt refused.  Plan of care continues.

## 2023-12-18 NOTE — Progress Notes (Signed)
 Progress Note   Patient: Hector [name] FMW:[id number] DOB: [birth year] DOA: 12/16/2023     2 DOS: the patient was seen and examined on 12/18/2023    Brief hospital course: From HPI [name] is a 58 y.o. male with medical history significant of chronic kidney disease stage IV, heart failure with reduced ejection fraction with a EF of 20 to 25%, diabetes, essential hypertension, hyperlipidemia, paroxysmal atrial fibrillation, alcoholic cardiomyopathy among other things who presented to the ER complaining of provide 2 weeks of progressive abdominal pain and distention.  He was having back pain and was seen here just last week in the ER.  He was treated for back pain and sent home.  It is not clear how much compliance patient has had.  Today however is noted to have markedly distended abdomen with shifting dullness.  Only trace pedal edema.  He is not requiring oxygen.  His renal function however has worsened with creatinine going from 3.74 previously to 5.78.  His GFR has significantly worsened.  Patient also had elevated troponin of 135 at the first set.  His urinalysis is worrisome for possible UTI patient is being admitted with acute on chronic kidney disease stage IV as well as possible UTI.  No obstruction on CT abdomen.     Assessment and Plan:   Acute on chronic congestive heart failure with reduced ejection fraction:  Patient is on Aldactone  and torsemide .  His EF is only 20% from previous echo.  With the worsening renal function would likely hold diuretics tonight.   Nephrology following Monitor input and output   Abdominal pain with distention: Patient appears to be having worsening cardiac as well as renal function.  This could reflect some type of cardiorenal syndrome.  His CT did show not show any significant ascites.     Acute on chronic kidney disease stage IV:  Worsening renal function.  Patient with EF of 20% therefore not sure he would be a good candidate for any  hemodialysis.  Nephrology on board and case discussed   History of alcoholic cardiomyopathy:  Patient has quit drinking he said.  At this point we will continue to monitor.  If there is any sign of alcohol withdrawal will start CIWA protocol   Paroxysmal atrial fibrillation:  Patient on chronic anticoagulation.  His rate is controlled.  Will continue his home regimen   COPD:  No acute exacerbation.  Continue to monitor   Hyperlipidemia:  Confirm and continue statin   Essential hypertension:  Blood pressure is controlled on home regimen.  Holding diuretics   Possible UTI Continue Rocephin    Type 2 diabetes:  Follow-up on glucose monitoring Continue insulin      Subjective:  Patient seen and examined at bedside this morning He denies nausea vomiting abdominal pain chest pain   Physical Exam:   Constitutional: Obese, chronically ill looking, NAD, calm, comfortable Eyes: PERRL, lids and conjunctivae normal ENMT: Mucous membranes are moist. Posterior pharynx clear of any exudate or lesions.Normal dentition.  Neck: normal, supple, no masses, no thyromegaly Respiratory: clear to auscultation bilaterally, no wheezing, no crackles. Normal respiratory effort. No accessory muscle use.  Cardiovascular: Regular rate and rhythm, no murmurs / rubs / gallops.  Trace extremity edema. 2+ pedal pulses. No carotid bruits.  Abdomen: Markedly distended abdomen, no palpable organomegaly, soft, no evidence of fluid,, no masses palpated. No hepatosplenomegaly. Bowel sounds positive.  Musculoskeletal: Good range of motion, no joint swelling or tenderness, Skin: no rashes, lesions, ulcers. No induration  Neurologic: CN 2-12 grossly intact. Sensation intact, DTR normal. Strength 5/5 in all 4.  Psychiatric: Normal judgment and insight. Alert and oriented x 3. Normal mood     Data Reviewed:  Vitals:   12/18/23 0406 12/18/23 0700 12/18/23 0719 12/18/23 1500  BP: (!) 118/94  (!) 119/97 100/75  Pulse:  73  95 60  Resp: 19  17 17   Temp: 98 F (36.7 C)  97.7 F (36.5 C) 98.1 F (36.7 C)  TempSrc: Oral     SpO2: 95%  93% 90%  Weight:  136 kg    Height:          Latest Ref Rng & Units 12/18/2023    6:29 AM 12/17/2023    3:01 AM 12/16/2023   12:30 PM  CBC  WBC 4.0 - 10.5 K/uL 6.4  6.9  7.0   Hemoglobin 13.0 - 17.0 g/dL 85.8  86.5  85.9   Hematocrit 39.0 - 52.0 % 44.5  41.3  44.3   Platelets 150 - 400 K/uL 204  203  222        Latest Ref Rng & Units 12/18/2023    6:29 AM 12/17/2023    3:01 AM 12/16/2023   12:30 PM  BMP  Glucose 70 - 99 mg/dL 874  86  869   BUN 6 - 20 mg/dL 73  72  71   Creatinine 0.61 - 1.24 mg/dL 4.10  4.33  4.21   Sodium 135 - 145 mmol/L 136  138  139   Potassium 3.5 - 5.1 mmol/L 4.0  3.5  3.7   Chloride 98 - 111 mmol/L 95  101  98   CO2 22 - 32 mmol/L 24  25  25    Calcium  8.9 - 10.3 mg/dL 8.9  8.4  8.7      Author: Drue ONEIDA Potter, MD 12/18/2023 5:08 PM  For on call review [email or web address].

## 2023-12-18 NOTE — Progress Notes (Signed)
 [facility]  ROUNDING NOTE   Subjective:   Patient seen sitting up on side of the bed. Patient on room air and endorses good output. Patient states he was told no emergent need for dialysis yet.   Objective:  Vital signs in last 24 hours:  Temp:  [97.7 F (36.5 C)-98.2 F (36.8 C)] 97.7 F (36.5 C) (10/25 0719) Pulse Rate:  [73-95] 95 (10/25 0719) Resp:  [17-19] 17 (10/25 0719) BP: (116-125)/(94-97) 119/97 (10/25 0719) SpO2:  [89 %-95 %] 93 % (10/25 0719) Weight:  [136 kg] 136 kg (10/25 0700)  Weight change: 1.5 kg Filed Weights   12/16/23 2110 12/17/23 0500 12/18/23 0700  Weight: 134.5 kg 135 kg 136 kg    Intake/Output: I/O last 3 completed shifts: In: 660 [P.O.:560; IV Piggyback:100] Out: 540 [Urine:540]   Intake/Output this shift:  No intake/output data recorded.  Physical Exam: General: NAD  Eyes: Anicteric  Neck: Supple  Lungs:  Diminished  Heart: Regular   Abdomen:  Distended but soft  Extremities:  Trace peripheral edema.  Neurologic: Alert, awake, conversant  Skin: Cool    Basic Metabolic Panel: Recent Labs  Lab 12/16/23 1230 12/17/23 0301 12/18/23 0629  NA 139 138 136  K 3.7 3.5 4.0  CL 98 101 95*  CO2 25 25 24   GLUCOSE 130* 86 125*  BUN 71* 72* 73*  CREATININE 5.78* 5.66* 5.89*  CALCIUM  8.7* 8.4* 8.9  PHOS  --  4.6  --     Liver Function Tests: Recent Labs  Lab 12/16/23 1230 12/17/23 0301  AST 192*  --   ALT 83*  --   ALKPHOS 76  --   BILITOT 1.7*  --   PROT 7.8  --   ALBUMIN  3.6 3.4*   Recent Labs  Lab 12/16/23 1230  LIPASE 26   No results for input(s): AMMONIA in the last 168 hours.  CBC: Recent Labs  Lab 12/16/23 1230 12/17/23 0301 12/18/23 0629  WBC 7.0 6.9 6.4  NEUTROABS  --   --  4.7  HGB 14.0 13.4 14.1  HCT 44.3 41.3 44.5  MCV 87.0 86.8 87.6  PLT 222 203 204    Cardiac Enzymes: No results for input(s): CKTOTAL, CKMB, CKMBINDEX, TROPONINI in the last 168 hours.  BNP: Invalid  input(s): POCBNP  CBG: No results for input(s): GLUCAP in the last 168 hours.  Microbiology: Results for orders placed or performed during the hospital encounter of 12/16/23  Urine Culture     Status: Abnormal   Collection Time: 12/16/23  6:56 PM   Specimen: Urine, Random  Result Value Ref Range Status   Specimen Description   Final    URINE, RANDOM Performed at [facility], [address] [place] [place], [place] [address]    Special Requests   Final    NONE Reflexed from [id number] Performed at [facility], [address] [place], [place] [address]    Culture (A)  Final    <10,000 COLONIES/mL INSIGNIFICANT GROWTH Performed at [facility], [address] [place], [place] [address]    Report Status 12/17/2023 FINAL  Final  Culture, blood (Routine X 2) w Reflex to ID Panel     Status: None (Preliminary result)   Collection Time: 12/16/23 10:45 PM   Specimen: BLOOD  Result Value Ref Range Status   Specimen Description BLOOD BLOOD RIGHT HAND  Final   Special Requests   Final    BOTTLES DRAWN AEROBIC AND ANAEROBIC Blood Culture adequate  volume   Culture  Setup Time   Final    GRAM POSITIVE COCCI IN BOTH AEROBIC AND ANAEROBIC BOTTLES Organism ID to follow CRITICAL RESULT CALLED TO, READ BACK BY AND VERIFIED WITH: MOSE NIELS GOWER D AT 1619 12/17/23 RAM Performed at [facility], [address] [place], [place] [address]    Culture [facility] POSITIVE COCCI  Final   Report Status PENDING  Incomplete  Culture, blood (Routine X 2) w Reflex to ID Panel     Status: None (Preliminary result)   Collection Time: 12/16/23 10:45 PM   Specimen: BLOOD  Result Value Ref Range Status   Specimen Description BLOOD BLOOD LEFT ARM  Final   Special Requests   Final    BOTTLES DRAWN AEROBIC AND ANAEROBIC Blood Culture adequate volume   Culture   Final    NO GROWTH 2 DAYS Performed at [facility], [address], [place], [place] [address]     Report Status PENDING  Incomplete  Blood Culture ID Panel (Reflexed)     Status: Abnormal   Collection Time: 12/16/23 10:45 PM  Result Value Ref Range Status   Enterococcus faecalis NOT DETECTED NOT DETECTED Final   Enterococcus Faecium NOT DETECTED NOT DETECTED Final   Listeria monocytogenes NOT DETECTED NOT DETECTED Final   Staphylococcus species DETECTED (A) NOT DETECTED Final    Comment: CRITICAL RESULT CALLED TO, READ BACK BY AND VERIFIED WITH: MOSE NIELS LOSE D AT 1619 12/17/23 RAM    Staphylococcus aureus (BCID) NOT DETECTED NOT DETECTED Final   Staphylococcus epidermidis NOT DETECTED NOT DETECTED Final   Staphylococcus lugdunensis NOT DETECTED NOT DETECTED Final   Streptococcus species NOT DETECTED NOT DETECTED Final   Streptococcus agalactiae NOT DETECTED NOT DETECTED Final   Streptococcus pneumoniae NOT DETECTED NOT DETECTED Final   Streptococcus pyogenes NOT DETECTED NOT DETECTED Final   A.calcoaceticus-baumannii NOT DETECTED NOT DETECTED Final   Bacteroides fragilis NOT DETECTED NOT DETECTED Final   Enterobacterales NOT DETECTED NOT DETECTED Final   Enterobacter cloacae complex NOT DETECTED NOT DETECTED Final   Escherichia coli NOT DETECTED NOT DETECTED Final   Klebsiella aerogenes NOT DETECTED NOT DETECTED Final   Klebsiella oxytoca NOT DETECTED NOT DETECTED Final   Klebsiella pneumoniae NOT DETECTED NOT DETECTED Final   Proteus species NOT DETECTED NOT DETECTED Final   Salmonella species NOT DETECTED NOT DETECTED Final   Serratia marcescens NOT DETECTED NOT DETECTED Final   Haemophilus influenzae NOT DETECTED NOT DETECTED Final   Neisseria meningitidis NOT DETECTED NOT DETECTED Final   Pseudomonas aeruginosa NOT DETECTED NOT DETECTED Final   Stenotrophomonas maltophilia NOT DETECTED NOT DETECTED Final   Candida albicans NOT DETECTED NOT DETECTED Final   Candida auris NOT DETECTED NOT DETECTED Final   Candida glabrata NOT DETECTED NOT DETECTED Final   Candida  krusei NOT DETECTED NOT DETECTED Final   Candida parapsilosis NOT DETECTED NOT DETECTED Final   Candida tropicalis NOT DETECTED NOT DETECTED Final   Cryptococcus neoformans/gattii NOT DETECTED NOT DETECTED Final    Comment: Performed at [facility], [address] [place], [place] [address]    Coagulation Studies: No results for input(s): LABPROT, INR in the last 72 hours.  Urinalysis: Recent Labs    12/16/23 1856  COLORURINE YELLOW*  LABSPEC 1.012  PHURINE 7.0  GLUCOSEU >=500*  HGBUR SMALL*  BILIRUBINUR NEGATIVE  KETONESUR NEGATIVE  PROTEINUR >=300*  NITRITE NEGATIVE  LEUKOCYTESUR LARGE*      Imaging: DG Chest 2 View Result Date:  12/16/2023 CLINICAL DATA:  Shortness of breath. EXAM: CHEST - 2 VIEW COMPARISON:  Chest radiograph dated 07/28/2023. FINDINGS: There is cardiomegaly with vascular congestion and edema. Right perihilar opacity may represent edema or pneumonia. No pleural effusion or pneumothorax. No acute osseous pathology. IMPRESSION: 1. Cardiomegaly with vascular congestion and edema. 2. Right perihilar opacity may represent edema or pneumonia. Electronically Signed   By: Vanetta Chou M.D.   On: 12/16/2023 18:40   CT ABDOMEN PELVIS WO CONTRAST Result Date: 12/16/2023 CLINICAL DATA:  Abdominal pain and distension 2 weeks. EXAM: CT ABDOMEN AND PELVIS WITHOUT CONTRAST TECHNIQUE: Multidetector CT imaging of the abdomen and pelvis was performed following the standard protocol without IV contrast. RADIATION DOSE REDUCTION: This exam was performed according to the departmental dose-optimization program which includes automated exposure control, adjustment of the mA and/or kV according to patient size and/or use of iterative reconstruction technique. COMPARISON:  CT 06/21/2023, 09/26/2022 as well as MRI 09/26/2022 FINDINGS: Lower chest: Borderline stable cardiomegaly. Visualized lung bases demonstrate no acute process. Calcified granuloma over the right lower  lobe. Hepatobiliary: Minimal cholelithiasis. Liver and biliary tree are normal. Pancreas: Mild fatty a atrophy of the pancreas which is otherwise unremarkable. Spleen: Normal. Adrenals/Urinary Tract: Adrenal glands are normal. Kidneys are normal in size without hydronephrosis or nephrolithiasis. Multiple bilateral hypo and hyperdense cysts unchanged and demonstrated on previous MRI 2023. Ureters are normal. Small diverticulum over the dome of the bladder which is otherwise unremarkable. Stomach/Bowel: Stomach and small bowel are normal. Appendix is normal. Moderate diverticulosis throughout the colon without active inflammation. Moderate fecal retention over the rectum. Vascular/Lymphatic: Minimal calcified plaque over the abdominal aorta which is normal caliber. Remaining vascular structures are unremarkable. No adenopathy. Reproductive: Prostate is unremarkable. Other: No free fluid or focal inflammatory change. Tiny umbilical hernia containing only peritoneal fat unchanged. Musculoskeletal: No focal abnormality. IMPRESSION: 1. No acute findings in the abdomen/pelvis. 2. Minimal cholelithiasis. 3. Moderate diverticulosis throughout the colon without active inflammation. 4. Multiple bilateral hypo and hyperdense renal cysts unchanged and demonstrated on previous MRI 2023. 5. Aortic atherosclerosis. Aortic Atherosclerosis (ICD10-I70.0). Electronically Signed   By: Toribio Agreste M.D.   On: 12/16/2023 15:03     Medications:    cefTRIAXone  (ROCEPHIN )  IV 1 g (12/17/23 2223)    amiodarone   200 mg Oral Daily   apixaban   5 mg Oral BID   aspirin   324 mg Oral Once   insulin  aspart  0-5 Units Subcutaneous QHS   insulin  aspart  0-6 Units Subcutaneous TID WC   levothyroxine   125 mcg Oral QAC breakfast   acetaminophen  **OR** acetaminophen , calcium  carbonate (dosed in mg elemental calcium ), camphor-menthol  **AND** hydrOXYzine, feeding supplement (NEPRO CARB STEADY), morphine  injection, ondansetron  **OR**  ondansetron  (ZOFRAN ) IV, sorbitol  Assessment/ Plan:  [name] is a 58 y.o.  male with past medical conditions including hypertension, paroxysmal A-fib on Eliquis , CHF with a EF less than 20%, and chronic kidney disease stage IV.  Patient currently admitted for Abdominal distention [R14.0] Elevated troponin [R79.89] AKI (acute kidney injury) [N17.9] Acute on chronic congestive heart failure, unspecified heart failure type (HCC) [I50.9]     Acute Kidney Injury on chronic kidney disease stage 4 with baseline creatinine 3.90 and GFR of 17 on 09/13/23.  Acute kidney injury workup remains in progress.  Patient has had multiple episodes of acute kidney injury and this may represent progression towards end-stage renal disease. Chronic kidney disease is secondary to hypertension and CAD.  No recent IV contrast exposure.  Prescribed diuretics outpatient, currently held.  Has received one-time dose IV furosemide .  CT abdomen pelvis negative for obstruction.  Continue supportive measures and monitoring of renal function.  Patient is aware of possible need to start dialysis and is agreeable.   Lab Results  Component Value Date   CREATININE 5.89 (H) 12/18/2023   CREATININE 5.66 (H) 12/17/2023   CREATININE 5.78 (H) 12/16/2023     Intake/Output Summary (Last 24 hours) at 12/18/2023 1027 Last data filed at 12/18/2023 0600 Gross per 24 hour  Intake 200 ml  Output 440 ml  Net -240 ml      2. Anemia of chronic kidney disease Recent Labs       Lab Results  Component Value Date    HGB 14.1 12/18/2023      Hemoglobin acceptable for renal patient, will continue to monitor   3. Secondary Hyperparathyroidism: with outpatient labs: PTH 1207, phosphorus 5.6, calcium  9.4 on 12/07/2023.    Recent Labs       Lab Results  Component Value Date    CALCIUM  8.9 (L) 12/18/2023    CAION 1.13 (L) 06/23/2023    CAION 1.10 (L) 06/23/2023    PHOS 4.6 12/17/2023      PTH elevated, will continue  to monitor bone minerals during this admission.   4. 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 Home regimen includes glipizide. Most recent hemoglobin A1c is 7.3 on 06/28/2023. Primary team monitoring with SSI while in patient.     LOS: 2 Anchor Dwan P Justn Quale 10/25/20259:51 AM

## 2023-12-19 DIAGNOSIS — N179 Acute kidney failure, unspecified: Secondary | ICD-10-CM | POA: Diagnosis not present

## 2023-12-19 LAB — BASIC METABOLIC PANEL WITH GFR
Anion gap: 14 (ref 5–15)
BUN: 71 mg/dL — ABNORMAL HIGH (ref 6–20)
CO2: 24 mmol/L (ref 22–32)
Calcium: 8.5 mg/dL — ABNORMAL LOW (ref 8.9–10.3)
Chloride: 100 mmol/L (ref 98–111)
Creatinine, Ser: 5.69 mg/dL — ABNORMAL HIGH (ref 0.61–1.24)
GFR, Estimated: 11 mL/min — ABNORMAL LOW (ref >60)
Glucose, Bld: 124 mg/dL — ABNORMAL HIGH (ref 70–99)
Potassium: 4.1 mmol/L (ref 3.5–5.1)
Sodium: 138 mmol/L (ref 135–145)

## 2023-12-19 LAB — CBC WITH DIFFERENTIAL/PLATELET
Abs Immature Granulocytes: 0.07 K/uL (ref 0.00–0.07)
Basophils Absolute: 0.1 K/uL (ref 0.0–0.1)
Basophils Relative: 1 %
Eosinophils Absolute: 0.2 K/uL (ref 0.0–0.5)
Eosinophils Relative: 2 %
HCT: 44.7 % (ref 39.0–52.0)
Hemoglobin: 14 g/dL (ref 13.0–17.0)
Immature Granulocytes: 1 %
Lymphocytes Relative: 18 %
Lymphs Abs: 1.4 K/uL (ref 0.7–4.0)
MCH: 28.2 pg (ref 26.0–34.0)
MCHC: 31.3 g/dL (ref 30.0–36.0)
MCV: 90.1 fL (ref 80.0–100.0)
Monocytes Absolute: 1 K/uL (ref 0.1–1.0)
Monocytes Relative: 13 %
Neutro Abs: 5.1 K/uL (ref 1.7–7.7)
Neutrophils Relative %: 65 %
Platelets: 183 K/uL (ref 150–400)
RBC: 4.96 MIL/uL (ref 4.22–5.81)
RDW: 17.4 % — ABNORMAL HIGH (ref 11.5–15.5)
WBC: 7.7 K/uL (ref 4.0–10.5)
nRBC: 0 % (ref 0.0–0.2)

## 2023-12-19 LAB — CULTURE, BLOOD (ROUTINE X 2): Special Requests: ADEQUATE

## 2023-12-19 MED ORDER — AMOXICILLIN-POT CLAVULANATE 875-125 MG PO TABS: 1.0000 | ORAL_TABLET | Freq: Two times a day (BID) | ORAL | 0 refills | Status: DC

## 2023-12-19 NOTE — Plan of Care (Signed)

## 2023-12-19 NOTE — Progress Notes (Signed)
 [facility]  ROUNDING NOTE   Subjective:  No acute events overnight. Patient lay flat in bed. Patient not open to interview or conversation. When asked, patient just wants to rest. No acute distress noted.    Objective:  Vital signs in last 24 hours:  Temp:  [97.8 F (36.6 C)-98.1 F (36.7 C)] 97.8 F (36.6 C) (10/26 0724) Pulse Rate:  [60-74] 72 (10/26 0724) Resp:  [17-18] 17 (10/26 0724) BP: (100-116)/(73-100) 116/100 (10/26 0724) SpO2:  [90 %-95 %] 95 % (10/26 0724) Weight:  [136.2 kg] 136.2 kg (10/26 0500)  Weight change: 0.215 kg Filed Weights   12/17/23 0500 12/18/23 0700 12/19/23 0500  Weight: 135 kg 136 kg (!) 136.2 kg    Intake/Output: I/O last 3 completed shifts: In: 1120 [P.O.:920; IV Piggyback:200] Out: 840 [Urine:840]   Intake/Output this shift:  No intake/output data recorded.  Physical Exam: General: NAD  Head: Normocephalic  Eyes: Anicteric, PERRL  Neck: Supple, trachea midline  Lungs:  Room air  Abdomen:  Soft  Extremities:  no peripheral edema.  Neurologic: alert  Skin: warm    Basic Metabolic Panel: Recent Labs  Lab 12/16/23 1230 12/17/23 0301 12/18/23 0629 12/19/23 0412  NA 139 138 136 138  K 3.7 3.5 4.0 4.1  CL 98 101 95* 100  CO2 25 25 24 24   GLUCOSE 130* 86 125* 124*  BUN 71* 72* 73* 71*  CREATININE 5.78* 5.66* 5.89* 5.69*  CALCIUM  8.7* 8.4* 8.9 8.5*  PHOS  --  4.6  --   --     Liver Function Tests: Recent Labs  Lab 12/16/23 1230 12/17/23 0301  AST 192*  --   ALT 83*  --   ALKPHOS 76  --   BILITOT 1.7*  --   PROT 7.8  --   ALBUMIN  3.6 3.4*   Recent Labs  Lab 12/16/23 1230  LIPASE 26   No results for input(s): AMMONIA in the last 168 hours.  CBC: Recent Labs  Lab 12/16/23 1230 12/17/23 0301 12/18/23 0629 12/19/23 0412  WBC 7.0 6.9 6.4 7.7  NEUTROABS  --   --  4.7 5.1  HGB 14.0 13.4 14.1 14.0  HCT 44.3 41.3 44.5 44.7  MCV 87.0 86.8 87.6 90.1  PLT 222 203 204 183    Cardiac  Enzymes: No results for input(s): CKTOTAL, CKMB, CKMBINDEX, TROPONINI in the last 168 hours.  BNP: Invalid input(s): POCBNP  CBG: No results for input(s): GLUCAP in the last 168 hours.  Microbiology: Results for orders placed or performed during the hospital encounter of 12/16/23  Urine Culture     Status: Abnormal   Collection Time: 12/16/23  6:56 PM   Specimen: Urine, Random  Result Value Ref Range Status   Specimen Description   Final    URINE, RANDOM Performed at [facility], [address] [place], [place] [address]    Special Requests   Final    NONE Reflexed from [id number] Performed at [facility], [address] [place], [place] [address]    Culture (A)  Final    <10,000 COLONIES/mL INSIGNIFICANT GROWTH Performed at [facility], [address], [place], [place] [address]    Report Status 12/17/2023 FINAL  Final  Culture, blood (Routine X 2) w Reflex to ID Panel     Status: None (Preliminary result)   Collection Time: 12/16/23 10:45 PM   Specimen: BLOOD  Result Value Ref Range Status   Specimen Description   Final  BLOOD BLOOD RIGHT HAND Performed at [facility], [address] [place], [place] [address]    Special Requests   Final    BOTTLES DRAWN AEROBIC AND ANAEROBIC Blood Culture adequate volume Performed at [facility] [address] [place], [place] [address]    Culture  Setup Time   Final    GRAM POSITIVE COCCI IN BOTH AEROBIC AND ANAEROBIC BOTTLES CRITICAL RESULT CALLED TO, READ BACK BY AND VERIFIED WITH: MOSE NIELS GOWER D AT 8380 12/17/23 RAM    Culture   Final    GRAM POSITIVE COCCI IDENTIFICATION TO FOLLOW Performed at [facility], [address], [place], [place] [address]    Report Status PENDING  Incomplete  Culture, blood (Routine X 2) w Reflex to ID Panel     Status: None (Preliminary result)   Collection Time: 12/16/23 10:45 PM   Specimen: BLOOD  Result Value  Ref Range Status   Specimen Description BLOOD BLOOD LEFT ARM  Final   Special Requests   Final    BOTTLES DRAWN AEROBIC AND ANAEROBIC Blood Culture adequate volume   Culture   Final    NO GROWTH 3 DAYS Performed at [facility], [address] [place], [place] [address]    Report Status PENDING  Incomplete  Blood Culture ID Panel (Reflexed)     Status: Abnormal   Collection Time: 12/16/23 10:45 PM  Result Value Ref Range Status   Enterococcus faecalis NOT DETECTED NOT DETECTED Final   Enterococcus Faecium NOT DETECTED NOT DETECTED Final   Listeria monocytogenes NOT DETECTED NOT DETECTED Final   Staphylococcus species DETECTED (A) NOT DETECTED Final    Comment: CRITICAL RESULT CALLED TO, READ BACK BY AND VERIFIED WITH: MOSE NIELS LOSE D AT 1619 12/17/23 RAM    Staphylococcus aureus (BCID) NOT DETECTED NOT DETECTED Final   Staphylococcus epidermidis NOT DETECTED NOT DETECTED Final   Staphylococcus lugdunensis NOT DETECTED NOT DETECTED Final   Streptococcus species NOT DETECTED NOT DETECTED Final   Streptococcus agalactiae NOT DETECTED NOT DETECTED Final   Streptococcus pneumoniae NOT DETECTED NOT DETECTED Final   Streptococcus pyogenes NOT DETECTED NOT DETECTED Final   A.calcoaceticus-baumannii NOT DETECTED NOT DETECTED Final   Bacteroides fragilis NOT DETECTED NOT DETECTED Final   Enterobacterales NOT DETECTED NOT DETECTED Final   Enterobacter cloacae complex NOT DETECTED NOT DETECTED Final   Escherichia coli NOT DETECTED NOT DETECTED Final   Klebsiella aerogenes NOT DETECTED NOT DETECTED Final   Klebsiella oxytoca NOT DETECTED NOT DETECTED Final   Klebsiella pneumoniae NOT DETECTED NOT DETECTED Final   Proteus species NOT DETECTED NOT DETECTED Final   Salmonella species NOT DETECTED NOT DETECTED Final   Serratia marcescens NOT DETECTED NOT DETECTED Final   Haemophilus influenzae NOT DETECTED NOT DETECTED Final   Neisseria meningitidis NOT DETECTED NOT DETECTED  Final   Pseudomonas aeruginosa NOT DETECTED NOT DETECTED Final   Stenotrophomonas maltophilia NOT DETECTED NOT DETECTED Final   Candida albicans NOT DETECTED NOT DETECTED Final   Candida auris NOT DETECTED NOT DETECTED Final   Candida glabrata NOT DETECTED NOT DETECTED Final   Candida krusei NOT DETECTED NOT DETECTED Final   Candida parapsilosis NOT DETECTED NOT DETECTED Final   Candida tropicalis NOT DETECTED NOT DETECTED Final   Cryptococcus neoformans/gattii NOT DETECTED NOT DETECTED Final    Comment: Performed at [facility], [address] [place], [place] [address]    Coagulation Studies: No results for input(s): LABPROT, INR in the last  72 hours.  Urinalysis: Recent Labs    12/16/23 1856  COLORURINE YELLOW*  LABSPEC 1.012  PHURINE 7.0  GLUCOSEU >=500*  HGBUR SMALL*  BILIRUBINUR NEGATIVE  KETONESUR NEGATIVE  PROTEINUR >=300*  NITRITE NEGATIVE  LEUKOCYTESUR LARGE*      Imaging: No results found.   Medications:    cefTRIAXone  (ROCEPHIN )  IV 1 g (12/18/23 2149)    amiodarone   200 mg Oral Daily   apixaban   5 mg Oral BID   aspirin   324 mg Oral Once   insulin  aspart  0-5 Units Subcutaneous QHS   insulin  aspart  0-6 Units Subcutaneous TID WC   levothyroxine   125 mcg Oral QAC breakfast   acetaminophen  **OR** acetaminophen , calcium  carbonate (dosed in mg elemental calcium ), camphor-menthol  **AND** hydrOXYzine, feeding supplement (NEPRO CARB STEADY), morphine  injection, ondansetron  **OR** ondansetron  (ZOFRAN ) IV, sorbitol  Assessment/ Plan:  [name] is a 58 y.o.  male  with past medical conditions including hypertension, paroxysmal A-fib on Eliquis , CHF with a EF less than 20%, and chronic kidney disease stage IV.  Patient currently admitted for Abdominal distention [R14.0] Elevated troponin [R79.89] AKI (acute kidney injury) [N17.9] Acute on chronic congestive heart failure, unspecified heart failure type (HCC) [I50.9]     Acute  Kidney Injury on chronic kidney disease stage 4 with baseline creatinine 3.90 and GFR of 17 on 09/13/23.  Acute kidney injury workup remains in progress.  Patient has had multiple episodes of acute kidney injury and this may represent progression towards end-stage renal disease. Chronic kidney disease is secondary to hypertension and CAD.  No recent IV contrast exposure.  Prescribed diuretics outpatient, currently held.  Has received one-time dose IV furosemide .  CT abdomen pelvis negative for obstruction.  Continue supportive measures and monitoring of renal function.  Patient is aware of possible need to start dialysis and is agreeable.  Update; Patient renal function stable and remains on room air. Will need follow-up with [facility] after discharge.   Lab Results  Component Value Date   CREATININE 5.69 (H) 12/19/2023   CREATININE 5.89 (H) 12/18/2023   CREATININE 5.66 (H) 12/17/2023     Intake/Output Summary (Last 24 hours) at 12/19/2023 1016 Last data filed at 12/19/2023 0600 Gross per 24 hour  Intake 920 ml  Output 400 ml  Net 520 ml      2. Anemia of chronic kidney disease Recent Labs           Lab Results  Component Value Date    HGB 14.0 12/19/2023      Hemoglobin stable and acceptable for renal patient   3. Secondary Hyperparathyroidism: with outpatient labs: PTH 1207, phosphorus 5.6, calcium  9.4 on 12/07/2023.    Recent Labs           Lab Results  Component Value Date    CALCIUM  8.5 (L) 12/19/2023    CAION 1.13 (L) 06/23/2023    CAION 1.10 (L) 06/23/2023    PHOS 4.6 12/17/2023      PTH elevated, will continue to monitor bone minerals during this admission.   4. 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 Home regimen includes glipizide. Most recent hemoglobin A1c is 7.3 on 06/28/2023. Primary team continuing to monitor with SSI while in patient.     LOS: 3 Hector [name] 10/26/202510:15 AM

## 2023-12-19 NOTE — Discharge Summary (Signed)
 Physician Discharge Summary   Patient: Hector [name] MRN: [id number] DOB: Jul 28, 1965  Admit date:     12/16/2023  Discharge date: 12/19/23  Discharge Physician: Drue ONEIDA Potter   PCP: [name], MD   Recommendations at discharge:  Follow-up with your nephrologist  Discharge Diagnoses: Principal Problem:   Acute renal failure superimposed on stage 4 chronic kidney disease (HCC) Active Problems:   End-stage systolic heart failure, acute on chronic (HCC)   Nonischemic cardiomyopathy: EF 20-25%   COPD with acute exacerbation (HCC)   Mixed hyperlipidemia   Paroxysmal A-fib (HCC)   Type II diabetes mellitus with renal manifestations (HCC)   Essential hypertension   Iron  deficiency anemia   UTI (urinary tract infection)   Obesity (BMI 30-39.9)   Hypertensive heart disease   Alcohol use, unspecified, in remission   Hypothyroidism   Coronary artery disease due to lipid rich plaque  Resolved Problems:   * No resolved hospital problems. [facility] Course: From HPI Hector [name] is a 58 y.o. male with medical history significant of chronic kidney disease stage IV, heart failure with reduced ejection fraction with a EF of 20 to 25%, diabetes, essential hypertension, hyperlipidemia, paroxysmal atrial fibrillation, alcoholic cardiomyopathy among other things who presented to the ER complaining of provide 2 weeks of progressive abdominal pain and distention.  He was having back pain and was seen here just last week in the ER.  He was treated for back pain and sent home.  It is not clear how much compliance patient has had.  Today however is noted to have markedly distended abdomen with shifting dullness.  Only trace pedal edema.  He is not requiring oxygen.  His renal function however has worsened with creatinine going from 3.74 previously to 5.78.  His GFR has significantly worsened.  Patient also had elevated troponin of 135 at the first set.  His urinalysis is worrisome  for possible UTI patient is being admitted with acute on chronic kidney disease stage IV as well as possible UTI.  No obstruction on CT abdomen.     Other hospital course as noted below: Assessment and Plan:   Acute on chronic congestive heart failure with reduced ejection fraction:  Patient is on Aldactone  and torsemide .  His EF is only 20% from previous echo.  Nephrotoxic agents on hold according to nephrologist recommendation Nephrology following and case discussed and according to nephrologist patient is cleared for discharge today to follow-up as an outpatient with his own nephrologist Monitor input and output   Abdominal pain with distention:-Resolved     Acute on chronic kidney disease stage IV:  Renal function is now stable.   Nephrology on board and case discussed With nephrologist planning possible dialysis in the near future Patient will follow-up as an outpatient   History of alcoholic cardiomyopathy:  Patient has quit drinking he said.  At this point we will continue to monitor.     Paroxysmal atrial fibrillation:  Patient on chronic anticoagulation.  His rate is controlled.  Will continue his home regimen   COPD:  No acute exacerbation.  Continue to monitor   Hyperlipidemia:  Confirm and continue statin   Essential hypertension:  Blood pressure is controlled on home regimen.  Holding diuretics   Possible UTI Received Rocephin  which is being transition to oral medication at discharge   Type 2 diabetes:  Resume home medication at discharge    Subjective:  Patient seen and examined at bedside this morning He denies  nausea vomiting abdominal pain chest pain   Consultants: Nephrology Procedures performed: None Disposition: Home Diet recommendation:  Cardiac diet DISCHARGE MEDICATION: Allergies as of 12/19/2023       Reactions   Esomeprazole Magnesium  Other (See Comments)   Suspected interstitial nephritis 2018   Egg Protein-containing Drug Products  Rash        Medication List     STOP taking these medications    empagliflozin  10 MG Tabs tablet Commonly known as: Jardiance    metolazone  2.5 MG tablet Commonly known as: ZAROXOLYN    torsemide  20 MG tablet Commonly known as: DEMADEX        TAKE these medications    allopurinol  100 MG tablet Commonly known as: ZYLOPRIM  Take 100 mg by mouth daily.   amiodarone  200 MG tablet Commonly known as: PACERONE  Take 1 tablet (200 mg total) by mouth daily.   amoxicillin-clavulanate 875-125 MG tablet Commonly known as: AUGMENTIN Take 1 tablet by mouth 2 (two) times daily for 5 days.   apixaban  5 MG Tabs tablet Commonly known as: ELIQUIS  Take 2 tabs twice a day until 6/14, then take 1 tab twice a day What changed:  how much to take how to take this when to take this additional instructions   atorvastatin  80 MG tablet Commonly known as: LIPITOR  Take 1 tablet (80 mg total) by mouth daily.   calcitRIOL 0.25 MCG capsule Commonly known as: ROCALTROL Take 0.25 mcg by mouth 3 (three) times a week.   glipiZIDE 5 MG 24 hr tablet Commonly known as: GLUCOTROL XL Take 5 mg by mouth daily.   levothyroxine  125 MCG tablet Commonly known as: SYNTHROID  Take 125 mcg by mouth daily before breakfast.   lidocaine  5 % Commonly known as: Lidoderm  Place 1 patch onto the skin every 12 (twelve) hours. Remove & Discard patch within 12 hours or as directed by MD   lipase/protease/amylase 63999 UNITS Cpep capsule Commonly known as: CREON  Take 1 capsule (36,000 Units total) by mouth 3 (three) times daily before meals.   methocarbamol  500 MG tablet Commonly known as: ROBAXIN  Take 1 tablet (500 mg total) by mouth every 8 (eight) hours as needed.   polyethylene glycol 17 g packet Commonly known as: MIRALAX  / GLYCOLAX  Take 17 g by mouth daily as needed for mild constipation.        Follow-up Information     [facility]. Go on 12/30/2023.    Specialty: Cardiology Why: Hospital Follow-Up 12/30/23 @ 10:30 Please bring all medciations to follow-up appointment with you. [facility], [address], Second Floor Free Valet Parking at the [employer] information: [address] [place]  [phone]               Discharge Exam: Fredricka Weights   12/17/23 0500 12/18/23 0700 12/19/23 0500  Weight: 135 kg 136 kg (!) 136.2 kg   Eyes: PERRL, lids and conjunctivae normal ENMT: Mucous membranes are moist. Posterior pharynx clear of any exudate or lesions.Normal dentition.  Neck: normal, supple, no masses, no thyromegaly Respiratory: clear to auscultation bilaterally, no wheezing, no crackles. Normal respiratory effort. No accessory muscle use.  Cardiovascular: Regular rate and rhythm, no murmurs / rubs / gallops.  Trace extremity edema. 2+ pedal pulses. No carotid bruits.  Abdomen: Obese nontender Musculoskeletal: Good range of motion, no joint swelling or tenderness, Skin: no rashes, lesions, ulcers. No induration Neurologic: CN 2-12 grossly intact. Sensation intact, DTR normal. Strength 5/5 in all 4.  Psychiatric: Normal judgment and insight. Alert and oriented x 3. Normal mood  Condition at discharge: good  The results of significant diagnostics from this hospitalization (including imaging, microbiology, ancillary and laboratory) are listed below for reference.   Imaging Studies: DG Chest 2 View Result Date: 12/16/2023 CLINICAL DATA:  Shortness of breath. EXAM: CHEST - 2 VIEW COMPARISON:  Chest radiograph dated 07/28/2023. FINDINGS: There is cardiomegaly with vascular congestion and edema. Right perihilar opacity may represent edema or pneumonia. No pleural effusion or pneumothorax. No acute osseous pathology. IMPRESSION: 1. Cardiomegaly with vascular congestion and edema. 2. Right perihilar opacity may represent edema or pneumonia. Electronically Signed   By: Vanetta Chou  M.D.   On: 12/16/2023 18:40   CT ABDOMEN PELVIS WO CONTRAST Result Date: 12/16/2023 CLINICAL DATA:  Abdominal pain and distension 2 weeks. EXAM: CT ABDOMEN AND PELVIS WITHOUT CONTRAST TECHNIQUE: Multidetector CT imaging of the abdomen and pelvis was performed following the standard protocol without IV contrast. RADIATION DOSE REDUCTION: This exam was performed according to the departmental dose-optimization program which includes automated exposure control, adjustment of the mA and/or kV according to patient size and/or use of iterative reconstruction technique. COMPARISON:  CT 06/21/2023, 09/26/2022 as well as MRI 09/26/2022 FINDINGS: Lower chest: Borderline stable cardiomegaly. Visualized lung bases demonstrate no acute process. Calcified granuloma over the right lower lobe. Hepatobiliary: Minimal cholelithiasis. Liver and biliary tree are normal. Pancreas: Mild fatty a atrophy of the pancreas which is otherwise unremarkable. Spleen: Normal. Adrenals/Urinary Tract: Adrenal glands are normal. Kidneys are normal in size without hydronephrosis or nephrolithiasis. Multiple bilateral hypo and hyperdense cysts unchanged and demonstrated on previous MRI 2023. Ureters are normal. Small diverticulum over the dome of the bladder which is otherwise unremarkable. Stomach/Bowel: Stomach and small bowel are normal. Appendix is normal. Moderate diverticulosis throughout the colon without active inflammation. Moderate fecal retention over the rectum. Vascular/Lymphatic: Minimal calcified plaque over the abdominal aorta which is normal caliber. Remaining vascular structures are unremarkable. No adenopathy. Reproductive: Prostate is unremarkable. Other: No free fluid or focal inflammatory change. Tiny umbilical hernia containing only peritoneal fat unchanged. Musculoskeletal: No focal abnormality. IMPRESSION: 1. No acute findings in the abdomen/pelvis. 2. Minimal cholelithiasis. 3. Moderate diverticulosis throughout the colon  without active inflammation. 4. Multiple bilateral hypo and hyperdense renal cysts unchanged and demonstrated on previous MRI 2023. 5. Aortic atherosclerosis. Aortic Atherosclerosis (ICD10-I70.0). Electronically Signed   By: Toribio Agreste M.D.   On: 12/16/2023 15:03    Microbiology: Results for orders placed or performed during the hospital encounter of 12/16/23  Urine Culture     Status: Abnormal   Collection Time: 12/16/23  6:56 PM   Specimen: Urine, Random  Result Value Ref Range Status   Specimen Description   Final    URINE, RANDOM Performed at [facility], [address], [place], [place] [address]    Special Requests   Final    NONE Reflexed from [id number] Performed at [facility], [address] [place], [place] [address]    Culture (A)  Final    <10,000 COLONIES/mL INSIGNIFICANT GROWTH Performed at [facility], [address] [place], [place] [address]    Report Status 12/17/2023 FINAL  Final  Culture, blood (Routine X 2) w Reflex to ID Panel     Status: None (Preliminary result)   Collection Time: 12/16/23 10:45 PM   Specimen: BLOOD  Result Value Ref Range Status   Specimen Description   Final    BLOOD BLOOD RIGHT  HAND Performed at [facility], [address] [place], [place] [address]    Special Requests   Final    BOTTLES DRAWN AEROBIC AND ANAEROBIC Blood Culture adequate volume Performed at [facility], [address] [place], [place] [address]    Culture  Setup Time   Final    GRAM POSITIVE COCCI IN BOTH AEROBIC AND ANAEROBIC BOTTLES CRITICAL RESULT CALLED TO, READ BACK BY AND VERIFIED WITH: MOSE NIELS GOWER D AT 8380 12/17/23 RAM    Culture   Final    GRAM POSITIVE COCCI IDENTIFICATION TO FOLLOW Performed at [facility], [address], [place], [place] [address]    Report Status PENDING  Incomplete  Culture, blood (Routine X 2) w Reflex to ID Panel     Status: None (Preliminary result)    Collection Time: 12/16/23 10:45 PM   Specimen: BLOOD  Result Value Ref Range Status   Specimen Description BLOOD BLOOD LEFT ARM  Final   Special Requests   Final    BOTTLES DRAWN AEROBIC AND ANAEROBIC Blood Culture adequate volume   Culture   Final    NO GROWTH 3 DAYS Performed at [facility], [address] [place], [place] [address]    Report Status PENDING  Incomplete  Blood Culture ID Panel (Reflexed)     Status: Abnormal   Collection Time: 12/16/23 10:45 PM  Result Value Ref Range Status   Enterococcus faecalis NOT DETECTED NOT DETECTED Final   Enterococcus Faecium NOT DETECTED NOT DETECTED Final   Listeria monocytogenes NOT DETECTED NOT DETECTED Final   Staphylococcus species DETECTED (A) NOT DETECTED Final    Comment: CRITICAL RESULT CALLED TO, READ BACK BY AND VERIFIED WITH: MOSE NIELS LOSE D AT 1619 12/17/23 RAM    Staphylococcus aureus (BCID) NOT DETECTED NOT DETECTED Final   Staphylococcus epidermidis NOT DETECTED NOT DETECTED Final   Staphylococcus lugdunensis NOT DETECTED NOT DETECTED Final   Streptococcus species NOT DETECTED NOT DETECTED Final   Streptococcus agalactiae NOT DETECTED NOT DETECTED Final   Streptococcus pneumoniae NOT DETECTED NOT DETECTED Final   Streptococcus pyogenes NOT DETECTED NOT DETECTED Final   A.calcoaceticus-baumannii NOT DETECTED NOT DETECTED Final   Bacteroides fragilis NOT DETECTED NOT DETECTED Final   Enterobacterales NOT DETECTED NOT DETECTED Final   Enterobacter cloacae complex NOT DETECTED NOT DETECTED Final   Escherichia coli NOT DETECTED NOT DETECTED Final   Klebsiella aerogenes NOT DETECTED NOT DETECTED Final   Klebsiella oxytoca NOT DETECTED NOT DETECTED Final   Klebsiella pneumoniae NOT DETECTED NOT DETECTED Final   Proteus species NOT DETECTED NOT DETECTED Final   Salmonella species NOT DETECTED NOT DETECTED Final   Serratia marcescens NOT DETECTED NOT DETECTED Final   Haemophilus influenzae NOT DETECTED NOT  DETECTED Final   Neisseria meningitidis NOT DETECTED NOT DETECTED Final   Pseudomonas aeruginosa NOT DETECTED NOT DETECTED Final   Stenotrophomonas maltophilia NOT DETECTED NOT DETECTED Final   Candida albicans NOT DETECTED NOT DETECTED Final   Candida auris NOT DETECTED NOT DETECTED Final   Candida glabrata NOT DETECTED NOT DETECTED Final   Candida krusei NOT DETECTED NOT DETECTED Final   Candida parapsilosis NOT DETECTED NOT DETECTED Final   Candida tropicalis NOT DETECTED NOT DETECTED Final   Cryptococcus neoformans/gattii NOT DETECTED NOT DETECTED Final    Comment: Performed at [facility], [address] [place], [place] [address]    Labs: CBC: Recent Labs  Lab 12/16/23 1230 12/17/23 0301 12/18/23 9370 12/19/23 9587  WBC 7.0 6.9 6.4 7.7  NEUTROABS  --   --  4.7 5.1  HGB 14.0 13.4 14.1 14.0  HCT 44.3 41.3 44.5 44.7  MCV 87.0 86.8 87.6 90.1  PLT 222 203 204 183   Basic Metabolic Panel: Recent Labs  Lab 12/16/23 1230 12/17/23 0301 12/18/23 0629 12/19/23 0412  NA 139 138 136 138  K 3.7 3.5 4.0 4.1  CL 98 101 95* 100  CO2 25 25 24 24   GLUCOSE 130* 86 125* 875*  BUN 71* 72* 73* 71*  CREATININE 5.78* 5.66* 5.89* 5.69*  CALCIUM  8.7* 8.4* 8.9 8.5*  PHOS  --  4.6  --   --    Liver Function Tests: Recent Labs  Lab 12/16/23 1230 12/17/23 0301  AST 192*  --   ALT 83*  --   ALKPHOS 76  --   BILITOT 1.7*  --   PROT 7.8  --   ALBUMIN  3.6 3.4*   CBG: No results for input(s): GLUCAP in the last 168 hours.  Discharge time spent:  38 minutes.  Signed: Drue ONEIDA Potter, MD [facility] 12/19/2023

## 2023-12-21 LAB — CULTURE, BLOOD (ROUTINE X 2)
Culture: NO GROWTH
Special Requests: ADEQUATE

## 2023-12-21 LAB — HEPATITIS B SURFACE ANTIBODY, QUANTITATIVE: Hep B S AB Quant (Post): <3.5 m[IU]/mL — ABNORMAL LOW

## 2023-12-23 ENCOUNTER — Emergency Department

## 2023-12-23 ENCOUNTER — Other Ambulatory Visit: Payer: Self-pay

## 2023-12-23 ENCOUNTER — Inpatient Hospital Stay
Admission: EM | Admit: 2023-12-23 | Discharge: 2024-01-07 | DRG: 291 | Disposition: A | Discharge status: Skilled Nursing Facility | Attending: Osteopathic Medicine | Admitting: Osteopathic Medicine

## 2023-12-23 DIAGNOSIS — D631 Anemia in chronic kidney disease: Secondary | ICD-10-CM | POA: Diagnosis present

## 2023-12-23 DIAGNOSIS — Z7901 Long term (current) use of anticoagulants: Secondary | ICD-10-CM

## 2023-12-23 DIAGNOSIS — I959 Hypotension, unspecified: Secondary | ICD-10-CM | POA: Diagnosis not present

## 2023-12-23 DIAGNOSIS — R0902 Hypoxemia: Secondary | ICD-10-CM | POA: Insufficient documentation

## 2023-12-23 DIAGNOSIS — Z8673 Personal history of transient ischemic attack (TIA), and cerebral infarction without residual deficits: Secondary | ICD-10-CM

## 2023-12-23 DIAGNOSIS — I5043 Acute on chronic combined systolic (congestive) and diastolic (congestive) heart failure: Secondary | ICD-10-CM | POA: Diagnosis present

## 2023-12-23 DIAGNOSIS — H109 Unspecified conjunctivitis: Secondary | ICD-10-CM | POA: Diagnosis present

## 2023-12-23 DIAGNOSIS — J9601 Acute respiratory failure with hypoxia: Secondary | ICD-10-CM | POA: Diagnosis present

## 2023-12-23 DIAGNOSIS — N189 Chronic kidney disease, unspecified: Secondary | ICD-10-CM | POA: Diagnosis present

## 2023-12-23 DIAGNOSIS — Z888 Allergy status to other drugs, medicaments and biological substances status: Secondary | ICD-10-CM

## 2023-12-23 DIAGNOSIS — E785 Hyperlipidemia, unspecified: Secondary | ICD-10-CM | POA: Diagnosis present

## 2023-12-23 DIAGNOSIS — Z8249 Family history of ischemic heart disease and other diseases of the circulatory system: Secondary | ICD-10-CM

## 2023-12-23 DIAGNOSIS — I4892 Unspecified atrial flutter: Secondary | ICD-10-CM | POA: Diagnosis present

## 2023-12-23 DIAGNOSIS — Z91012 Allergy to eggs, unspecified: Secondary | ICD-10-CM

## 2023-12-23 DIAGNOSIS — Z751 Person awaiting admission to adequate facility elsewhere: Secondary | ICD-10-CM

## 2023-12-23 DIAGNOSIS — Z992 Dependence on renal dialysis: Secondary | ICD-10-CM

## 2023-12-23 DIAGNOSIS — K5900 Constipation, unspecified: Secondary | ICD-10-CM | POA: Diagnosis present

## 2023-12-23 DIAGNOSIS — E039 Hypothyroidism, unspecified: Secondary | ICD-10-CM | POA: Diagnosis not present

## 2023-12-23 DIAGNOSIS — N2581 Secondary hyperparathyroidism of renal origin: Secondary | ICD-10-CM | POA: Diagnosis present

## 2023-12-23 DIAGNOSIS — G4733 Obstructive sleep apnea (adult) (pediatric): Secondary | ICD-10-CM | POA: Diagnosis present

## 2023-12-23 DIAGNOSIS — I5023 Acute on chronic systolic (congestive) heart failure: Secondary | ICD-10-CM | POA: Diagnosis present

## 2023-12-23 DIAGNOSIS — I2510 Atherosclerotic heart disease of native coronary artery without angina pectoris: Secondary | ICD-10-CM | POA: Diagnosis present

## 2023-12-23 DIAGNOSIS — Z87891 Personal history of nicotine dependence: Secondary | ICD-10-CM

## 2023-12-23 DIAGNOSIS — E669 Obesity, unspecified: Secondary | ICD-10-CM | POA: Diagnosis present

## 2023-12-23 DIAGNOSIS — Z794 Long term (current) use of insulin: Secondary | ICD-10-CM

## 2023-12-23 DIAGNOSIS — I509 Heart failure, unspecified: Principal | ICD-10-CM

## 2023-12-23 DIAGNOSIS — M109 Gout, unspecified: Secondary | ICD-10-CM | POA: Diagnosis present

## 2023-12-23 DIAGNOSIS — Z833 Family history of diabetes mellitus: Secondary | ICD-10-CM

## 2023-12-23 DIAGNOSIS — R531 Weakness: Secondary | ICD-10-CM | POA: Diagnosis present

## 2023-12-23 DIAGNOSIS — F1010 Alcohol abuse, uncomplicated: Secondary | ICD-10-CM | POA: Diagnosis present

## 2023-12-23 DIAGNOSIS — Z83438 Family history of other disorder of lipoprotein metabolism and other lipidemia: Secondary | ICD-10-CM

## 2023-12-23 DIAGNOSIS — I428 Other cardiomyopathies: Secondary | ICD-10-CM | POA: Diagnosis present

## 2023-12-23 DIAGNOSIS — Z79890 Hormone replacement therapy (postmenopausal): Secondary | ICD-10-CM

## 2023-12-23 DIAGNOSIS — N179 Acute kidney failure, unspecified: Secondary | ICD-10-CM | POA: Diagnosis present

## 2023-12-23 DIAGNOSIS — Z79899 Other long term (current) drug therapy: Secondary | ICD-10-CM

## 2023-12-23 DIAGNOSIS — I132 Hypertensive heart and chronic kidney disease with heart failure and with stage 5 chronic kidney disease, or end stage renal disease: Principal | ICD-10-CM | POA: Diagnosis present

## 2023-12-23 DIAGNOSIS — N390 Urinary tract infection, site not specified: Secondary | ICD-10-CM

## 2023-12-23 DIAGNOSIS — Z6841 Body Mass Index (BMI) 40.0 and over, adult: Secondary | ICD-10-CM

## 2023-12-23 DIAGNOSIS — Z4901 Encounter for fitting and adjustment of extracorporeal dialysis catheter: Secondary | ICD-10-CM

## 2023-12-23 DIAGNOSIS — E66813 Obesity, class 3: Secondary | ICD-10-CM | POA: Diagnosis present

## 2023-12-23 DIAGNOSIS — N184 Chronic kidney disease, stage 4 (severe): Secondary | ICD-10-CM | POA: Diagnosis present

## 2023-12-23 DIAGNOSIS — Z7984 Long term (current) use of oral hypoglycemic drugs: Secondary | ICD-10-CM

## 2023-12-23 DIAGNOSIS — N186 End stage renal disease: Secondary | ICD-10-CM | POA: Diagnosis present

## 2023-12-23 DIAGNOSIS — I480 Paroxysmal atrial fibrillation: Secondary | ICD-10-CM | POA: Diagnosis present

## 2023-12-23 DIAGNOSIS — E1122 Type 2 diabetes mellitus with diabetic chronic kidney disease: Secondary | ICD-10-CM | POA: Diagnosis present

## 2023-12-23 DIAGNOSIS — K8689 Other specified diseases of pancreas: Secondary | ICD-10-CM | POA: Diagnosis present

## 2023-12-23 DIAGNOSIS — T502X5A Adverse effect of carbonic-anhydrase inhibitors, benzothiadiazides and other diuretics, initial encounter: Secondary | ICD-10-CM | POA: Diagnosis present

## 2023-12-23 LAB — URINALYSIS, ROUTINE W REFLEX MICROSCOPIC
Bilirubin Urine: NEGATIVE
Glucose, UA: >=500 mg/dL — AB
Ketones, ur: NEGATIVE mg/dL
Nitrite: NEGATIVE
Protein, ur: 100 mg/dL — AB
Specific Gravity, Urine: 1.007 (ref 1.005–1.030)
pH: 7 (ref 5.0–8.0)

## 2023-12-23 LAB — COMPREHENSIVE METABOLIC PANEL WITH GFR
ALT: 58 U/L — ABNORMAL HIGH (ref 0–44)
AST: 109 U/L — ABNORMAL HIGH (ref 15–41)
Albumin: 3.9 g/dL (ref 3.5–5.0)
Alkaline Phosphatase: 75 U/L (ref 38–126)
Anion gap: 15 (ref 5–15)
BUN: 58 mg/dL — ABNORMAL HIGH (ref 6–20)
CO2: 24 mmol/L (ref 22–32)
Calcium: 9.2 mg/dL (ref 8.9–10.3)
Chloride: 101 mmol/L (ref 98–111)
Creatinine, Ser: 4.75 mg/dL — ABNORMAL HIGH (ref 0.61–1.24)
GFR, Estimated: 13 mL/min — ABNORMAL LOW (ref >60)
Glucose, Bld: 127 mg/dL — ABNORMAL HIGH (ref 70–99)
Potassium: 4 mmol/L (ref 3.5–5.1)
Sodium: 140 mmol/L (ref 135–145)
Total Bilirubin: 1.7 mg/dL — ABNORMAL HIGH (ref 0.0–1.2)
Total Protein: 7.5 g/dL (ref 6.5–8.1)

## 2023-12-23 LAB — CBC
HCT: 43.2 % (ref 39.0–52.0)
Hemoglobin: 14.1 g/dL (ref 13.0–17.0)
MCH: 28.1 pg (ref 26.0–34.0)
MCHC: 32.6 g/dL (ref 30.0–36.0)
MCV: 86.2 fL (ref 80.0–100.0)
Platelets: 194 K/uL (ref 150–400)
RBC: 5.01 MIL/uL (ref 4.22–5.81)
RDW: 18.3 % — ABNORMAL HIGH (ref 11.5–15.5)
WBC: 7.3 K/uL (ref 4.0–10.5)
nRBC: 0 % (ref 0.0–0.2)

## 2023-12-23 LAB — LIPASE, BLOOD: Lipase: 23 U/L (ref 11–51)

## 2023-12-23 LAB — BRAIN NATRIURETIC PEPTIDE: B Natriuretic Peptide: 552.3 pg/mL — ABNORMAL HIGH (ref 0.0–100.0)

## 2023-12-23 MED ORDER — GLIPIZIDE ER 5 MG PO TB24
5.0000 mg | ORAL_TABLET | Freq: Every day | ORAL | Status: DC
Start: 2023-12-24 — End: 2023-12-24
  Administered 2023-12-24: 5 mg via ORAL
  Filled 2023-12-23: qty 1

## 2023-12-23 MED ORDER — ACETAMINOPHEN 325 MG PO TABS
650.0000 mg | ORAL_TABLET | Freq: Four times a day (QID) | ORAL | Status: DC | PRN
  Administered 2023-12-29 – 2024-01-03 (×3): 650 mg via ORAL
  Filled 2023-12-23 (×3): qty 2

## 2023-12-23 MED ORDER — ONDANSETRON HCL 4 MG/2ML IJ SOLN
4.0000 mg | Freq: Four times a day (QID) | INTRAMUSCULAR | Status: DC | PRN
  Administered 2023-12-24: 4 mg via INTRAVENOUS
  Filled 2023-12-23: qty 2

## 2023-12-23 MED ORDER — PANCRELIPASE (LIP-PROT-AMYL) 12000-38000 UNITS PO CPEP
36000.0000 [IU] | ORAL_CAPSULE | Freq: Three times a day (TID) | ORAL | Status: DC
  Administered 2023-12-24 – 2024-01-07 (×34): 36000 [IU] via ORAL
  Filled 2023-12-23 (×7): qty 1
  Filled 2023-12-23: qty 3
  Filled 2023-12-23 (×22): qty 1
  Filled 2023-12-23: qty 3
  Filled 2023-12-23 (×15): qty 1

## 2023-12-23 MED ORDER — MORPHINE SULFATE (PF) 2 MG/ML IV SOLN
2.0000 mg | Freq: Once | INTRAVENOUS | Status: AC
  Administered 2023-12-23: 2 mg via INTRAVENOUS
  Filled 2023-12-23: qty 1

## 2023-12-23 MED ORDER — ALLOPURINOL 100 MG PO TABS
100.0000 mg | ORAL_TABLET | Freq: Every day | ORAL | Status: DC
  Administered 2023-12-24 – 2024-01-07 (×14): 100 mg via ORAL
  Filled 2023-12-23 (×15): qty 1

## 2023-12-23 MED ORDER — AMIODARONE HCL 200 MG PO TABS
200.0000 mg | ORAL_TABLET | Freq: Every day | ORAL | Status: DC
  Administered 2023-12-24 – 2024-01-07 (×14): 200 mg via ORAL
  Filled 2023-12-23 (×15): qty 1

## 2023-12-23 MED ORDER — FUROSEMIDE 10 MG/ML IJ SOLN
40.0000 mg | Freq: Two times a day (BID) | INTRAMUSCULAR | Status: DC
  Administered 2023-12-24 – 2023-12-25 (×3): 40 mg via INTRAVENOUS
  Filled 2023-12-23 (×3): qty 4

## 2023-12-23 MED ORDER — TRAZODONE HCL 50 MG PO TABS
25.0000 mg | ORAL_TABLET | Freq: Every evening | ORAL | Status: DC | PRN
  Administered 2023-12-25 – 2024-01-06 (×11): 25 mg via ORAL
  Filled 2023-12-23 (×12): qty 1

## 2023-12-23 MED ORDER — LIDOCAINE 5 % EX PTCH
1.0000 | MEDICATED_PATCH | Freq: Two times a day (BID) | CUTANEOUS | Status: DC
  Administered 2023-12-23 – 2024-01-07 (×28): 1 via TRANSDERMAL
  Filled 2023-12-23 (×30): qty 1

## 2023-12-23 MED ORDER — ONDANSETRON HCL 4 MG/2ML IJ SOLN
4.0000 mg | Freq: Once | INTRAMUSCULAR | Status: AC
  Administered 2023-12-23: 4 mg via INTRAVENOUS
  Filled 2023-12-23: qty 2

## 2023-12-23 MED ORDER — MAGNESIUM HYDROXIDE 400 MG/5ML PO SUSP
30.0000 mL | Freq: Every day | ORAL | Status: DC | PRN
Start: 2023-12-23 — End: 2024-01-07
  Administered 2024-01-06: 30 mL via ORAL
  Filled 2023-12-23 (×2): qty 30

## 2023-12-23 MED ORDER — METHOCARBAMOL 500 MG PO TABS
500.0000 mg | ORAL_TABLET | Freq: Three times a day (TID) | ORAL | Status: DC | PRN
  Administered 2023-12-25 – 2024-01-07 (×19): 500 mg via ORAL
  Filled 2023-12-23 (×19): qty 1

## 2023-12-23 MED ORDER — APIXABAN 5 MG PO TABS
5.0000 mg | ORAL_TABLET | Freq: Two times a day (BID) | ORAL | Status: DC
  Administered 2023-12-23 – 2024-01-07 (×29): 5 mg via ORAL
  Filled 2023-12-23 (×30): qty 1

## 2023-12-23 MED ORDER — ONDANSETRON HCL 4 MG PO TABS
4.0000 mg | ORAL_TABLET | Freq: Four times a day (QID) | ORAL | Status: DC | PRN
  Administered 2024-01-01: 4 mg via ORAL
  Filled 2023-12-23: qty 1

## 2023-12-23 MED ORDER — FUROSEMIDE 10 MG/ML IJ SOLN
80.0000 mg | Freq: Once | INTRAMUSCULAR | Status: AC
Start: 2023-12-23 — End: 2023-12-23
  Administered 2023-12-23: 80 mg via INTRAVENOUS
  Filled 2023-12-23: qty 8

## 2023-12-23 MED ORDER — ACETAMINOPHEN 650 MG RE SUPP: 650.0000 mg | Freq: Four times a day (QID) | RECTAL | Status: DC | PRN

## 2023-12-23 MED ORDER — ATORVASTATIN CALCIUM 20 MG PO TABS
80.0000 mg | ORAL_TABLET | Freq: Every day | ORAL | Status: DC
  Administered 2023-12-24 – 2024-01-07 (×14): 80 mg via ORAL
  Filled 2023-12-23 (×5): qty 1
  Filled 2023-12-23: qty 4
  Filled 2023-12-23: qty 1
  Filled 2023-12-23: qty 4
  Filled 2023-12-23 (×7): qty 1

## 2023-12-23 NOTE — ED Triage Notes (Signed)
 First Nurse Note: Patient to ED via [facility] from home for upper abd pain and bilateral lower leg swelling- left worse. No BM in 4 days. Recently dc'd from hospital on Sunday. AOx4  105 HR 98% RA 119/62

## 2023-12-23 NOTE — ED Triage Notes (Signed)
 Patient to ED via [facility] from home; reports abdominal pain and bilateral leg swelling.

## 2023-12-23 NOTE — H&P (Incomplete)
 [place]   PATIENT NAME: Hector [name]    MR#:  [id number]  DATE OF BIRTH:  05-10-1965  DATE OF ADMISSION:  12/23/2023  PRIMARY CARE PHYSICIAN: [name], MD   Patient is coming from: Home  REQUESTING/REFERRING PHYSICIAN: Jacolyn Guild, MD  CHIEF COMPLAINT:   Chief Complaint  Patient presents with   Abdominal Pain    HISTORY OF PRESENT ILLNESS:  Hector [name] is a 58 y.o. 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  Admitted to abdominal distention from his recent admission without significant improvement.  No fever or chills.  No chest pain or palpitations.  No nausea or vomiting or abdominal pain.  No dysuria, oliguria or hematuria or flank pain.  He is admitted here for AKI on stage IV CKD and acute on chronic CHF from 10/23 till 10/26.  ED Course: When the patient came to the ER, BP was 128/112 with heart rate of 107 with otherwise normal vital signs.  Labs revealed BUN of 58 and creatinine 4.75 versus the previous levels.  AST was 109 and ALT 58, total bilirubin 0.7 and BNP was 552.3.  CBC was normal.  UA showed more than 500 glucose 100 protein and rare bacteria with 21-50 WBCs. EKG as reviewed by me : EKG showed atrial fibrillation with rapid ventricular sponsor of 101 with nonspecific interventricular conduction delay and LAD. Imaging: 2 view chest x-ray showed cardiomegaly with vascular congestion and pulmonary edema which is worsened in appearance with no sizable pleural effusion.  The patient was given 80 mg of IV Lasix , 4 mg of IV Zofran , 2 mg of IV morphine  sulfate and  will be admitted to a telemetry bed for further evaluation and management. PAST MEDICAL HISTORY:   Past Medical History:  Diagnosis Date   Alcohol abuse    Alcoholic cardiomyopathy (HCC)    a. 12/2007 MV: EF 28%, no isch;  b. 8/12 Echo: EF 25-35%; c. 02/2014 Echo: EF 20-25%; d. 12/2014 Cath: minimal CAD; e. 01/2015 Echo: EF 50-55%;  d. 05/2016 Echo: EF 30-35%, diff HK, gr2 DD; e. 11/2016 Echo: EF 45-50%, diff HK; f. 09/2021 Echo: EF <20%; g. 11/2021 Echo: EF<20%.   Chronic combined systolic (congestive) and diastolic (congestive) heart failure (HCC)    a. 05/2016 Echo: EF 30-35%; b. 11/2016 Echo: EF 45-50%, diff HK; c. 09/2021 Echo: EF < 20%; d. 11/2021 Echo: EF <20%, no rwma, Nl RV fxn, sev dil LA, mod-sev MR. mod dil PA w/ mildly elev PASP.   CKD (chronic kidney disease), stage III (HCC)    Elevated troponin (chronic)    Essential hypertension    GI bleed 11/2013   Hyperlipidemia    Pancreatitis    Paroxysmal A-fib (HCC)    a. new onset s/p unsuccessful TEE/DCCV on 08/16/2014; b. CHA2DS2VASc = 3-> eliquis  (freq noncompliant); c. 02/2021 s/p TEE/DCCV; d. Prev on amio->d/c 2/2 abnl TFTs.   Paroxysmal atrial flutter (HCC)    Sleep-disordered breathing    Has yet to have a sleep study   Stroke [facility])     PAST SURGICAL HISTORY:   Past Surgical History:  Procedure Laterality Date   CARDIAC CATHETERIZATION N/A 01/09/2015  Procedure: Left Heart Cath and Coronary Angiography;  Surgeon: Alm LELON Clay, MD;  Location: [facility] INVASIVE CV LAB;  Service: Cardiovascular;  Laterality: N/A;   CARDIOVERSION N/A 03/05/2021   Procedure: CARDIOVERSION;  Surgeon: Darliss Rogue, MD;  Location: [facility] ORS;  Service: Cardiovascular;  Laterality: N/A;   CARDIOVERSION N/A 01/19/2023   Procedure: CARDIOVERSION;  Surgeon: Rolan Ezra RAMAN, MD;  Location: [facility] ORS;  Service: Cardiovascular;  Laterality: N/A;   CARDIOVERSION N/A 07/30/2023   Procedure: CARDIOVERSION;  Surgeon: Cherrie Toribio SAUNDERS, MD;  Location: [facility]  ORS;  Service: Cardiovascular;  Laterality: N/A;   CENTRAL LINE INSERTION  04/28/2022   Procedure: CENTRAL LINE INSERTION;  Surgeon: Mady Bruckner, MD;  Location: [facility] INVASIVE CV LAB;  Service: Cardiovascular;;   COLONOSCOPY N/A 07/22/2020   Procedure: COLONOSCOPY;  Surgeon: Toledo, Ladell POUR, MD;  Location: [facility] ENDOSCOPY;  Service: Gastroenterology;  Laterality: N/A;   ELECTROPHYSIOLOGIC STUDY N/A 08/16/2014   Procedure: CARDIOVERSION;  Surgeon: Evalene JINNY Lunger, MD;  Location: [facility] ORS;  Service: Cardiovascular;  Laterality: N/A;   ESOPHAGOGASTRODUODENOSCOPY (EGD) WITH PROPOFOL  N/A 05/04/2022   Procedure: ESOPHAGOGASTRODUODENOSCOPY (EGD) WITH PROPOFOL ;  Surgeon: Unk Corinn Skiff, MD;  Location: [facility] ENDOSCOPY;  Service: Gastroenterology;  Laterality: N/A;   FLEXIBLE SIGMOIDOSCOPY N/A 10/10/2015   Procedure: FLEXIBLE SIGMOIDOSCOPY;  Surgeon: Gladis RAYMOND Mariner, MD;  Location: [facility] ENDOSCOPY;  Service: Endoscopy;  Laterality: N/A;   KNEE SURGERY Right    NM MYOVIEW  LTD  November 2011   No ischemia or infarction. EF 50-55% ( no improvement from 2009 Myoview  EF of 28%   RIGHT HEART CATH N/A 04/28/2022   Procedure: RIGHT HEART CATH;  Surgeon: Mady Bruckner, MD;  Location: [facility] INVASIVE CV LAB;  Service: Cardiovascular;  Laterality: N/A;   RIGHT HEART CATH N/A 06/23/2023   Procedure: RIGHT HEART CATH;  Surgeon: Rolan Ezra RAMAN, MD;  Location: [facility] INVASIVE CV LAB;  Service: Cardiovascular;  Laterality: N/A;   TEE WITHOUT CARDIOVERSION N/A 08/16/2014   Procedure: TRANSESOPHAGEAL ECHOCARDIOGRAM (TEE);  Surgeon: Evalene JINNY Lunger, MD;  Location: [facility] ORS;  Service: Cardiovascular;  Laterality: N/A;   TEE WITHOUT CARDIOVERSION N/A 03/05/2021   Procedure: TRANSESOPHAGEAL ECHOCARDIOGRAM (TEE);  Surgeon: Darliss Rogue, MD;  Location: [facility] ORS;  Service: Cardiovascular;  Laterality: N/A;   TEE WITHOUT CARDIOVERSION N/A 05/01/2022   Procedure: TRANSESOPHAGEAL ECHOCARDIOGRAM;  Surgeon: Rolan Ezra RAMAN,  MD;  Location: [facility] ORS;  Service: Cardiovascular;  Laterality: N/A;  TEE with cardioversion   TEE WITHOUT CARDIOVERSION N/A 01/19/2023   Procedure: TRANSESOPHAGEAL ECHOCARDIOGRAM (TEE);  Surgeon: Rolan Ezra RAMAN, MD;  Location: [facility] ORS;  Service: Cardiovascular;  Laterality: N/A;   TEE WITHOUT CARDIOVERSION N/A 07/30/2023   Procedure: ECHOCARDIOGRAM, TRANSESOPHAGEAL;  Surgeon: Cherrie Toribio SAUNDERS, MD;  Location: [facility] ORS;  Service: Cardiovascular;  Laterality: N/A;    SOCIAL HISTORY:   Social History   Tobacco Use   Smoking status: Former    Current packs/day: 0.00    Average packs/day: 1 pack/day for 12.0 years (12.0 ttl pk-yrs)    Types: Cigarettes    Start date: 02/23/1986    Quit date: 02/23/1998    Years since quitting: 25.8   Smokeless tobacco: Never  Substance Use Topics   Alcohol use: Not Currently    Alcohol/week: 0.0 standard drinks of alcohol    Comment: Past heavy drinker    FAMILY HISTORY:   Family History  Problem Relation Age of Onset   Hypertension Mother    Hyperlipidemia Mother    Diabetes Mother  DRUG ALLERGIES:   Allergies  Allergen Reactions   Esomeprazole Magnesium  Other (See Comments)    Suspected interstitial nephritis 2018   Egg Protein-Containing Drug Products Rash    REVIEW OF SYSTEMS:   ROS As per history of present illness. All pertinent systems were reviewed above. Constitutional, HEENT, cardiovascular, respiratory, GI, GU, musculoskeletal, neuro, psychiatric, endocrine, integumentary and hematologic systems were reviewed and are otherwise negative/unremarkable except for positive findings mentioned above in the HPI.   MEDICATIONS AT HOME:   Prior to Admission medications   Medication Sig Start Date End Date Taking? Authorizing Provider  allopurinol  (ZYLOPRIM ) 100 MG tablet Take 100 mg by mouth daily. 05/03/23   [provider]  amiodarone  (PACERONE ) 200 MG tablet Take 1 tablet (200 mg total) by mouth daily. 09/02/23    Donette Ellouise LABOR, FNP  amoxicillin-clavulanate (AUGMENTIN) 875-125 MG tablet Take 1 tablet by mouth 2 (two) times daily for 5 days. 12/19/23 12/24/23  Dorinda Drue DASEN, MD  apixaban  (ELIQUIS ) 5 MG TABS tablet Take 2 tabs twice a day until 6/14, then take 1 tab twice a day Patient taking differently: Take 5 mg by mouth 2 (two) times daily. 09/02/23   Donette Ellouise LABOR, FNP  atorvastatin  (LIPITOR ) 80 MG tablet Take 1 tablet (80 mg total) by mouth daily. 09/02/23   Donette Ellouise LABOR, FNP  calcitRIOL (ROCALTROL) 0.25 MCG capsule Take 0.25 mcg by mouth 3 (three) times a week. Patient not taking: Reported on 11/10/2023    [provider]  glipiZIDE (GLUCOTROL XL) 5 MG 24 hr tablet Take 5 mg by mouth daily. 11/04/23   [provider]  levothyroxine  (SYNTHROID ) 125 MCG tablet Take 125 mcg by mouth daily before breakfast. Patient not taking: Reported on 12/16/2023    [provider]  lidocaine  (LIDODERM ) 5 % Place 1 patch onto the skin every 12 (twelve) hours. Remove & Discard patch within 12 hours or as directed by MD 12/10/23 12/09/24  Claudene Rover, MD  lipase/protease/amylase (CREON ) 36000 UNITS CPEP capsule Take 1 capsule (36,000 Units total) by mouth 3 (three) times daily before meals. Patient not taking: Reported on 11/10/2023 01/19/23   Josette Ade, MD  methocarbamol  (ROBAXIN ) 500 MG tablet Take 1 tablet (500 mg total) by mouth every 8 (eight) hours as needed. 12/10/23   Claudene Rover, MD  polyethylene glycol (MIRALAX  / GLYCOLAX ) 17 g packet Take 17 g by mouth daily as needed for mild constipation. 01/19/23   Josette Ade, MD      VITAL SIGNS:  Blood pressure 118/74, pulse 84, temperature 98.4 F (36.9 C), temperature source Axillary, resp. rate (!) 21, height 5' 11 (1.803 m), weight 127 kg, SpO2 96%.  PHYSICAL EXAMINATION:  Physical Exam  GENERAL:  58 y.o.-year-old patient lying in the bed with mild respiratory distress with conversational dyspnea. EYES: Pupils equal,  round, reactive to light and accommodation. No scleral icterus. Extraocular muscles intact.  HEENT: Head atraumatic, normocephalic. Oropharynx and nasopharynx clear.  NECK:  Supple, no jugular venous distention. No thyroid  enlargement, no tenderness.  LUNGS: Diminished bibasilar breath sounds with bibasilar rales.  No use of accessory muscles of respiration.  CARDIOVASCULAR: Regular rate and rhythm, S1, S2 normal. No murmurs, rubs, or gallops.  ABDOMEN: Soft, nondistended, nontender. Bowel sounds present. No organomegaly or mass.  EXTREMITIES: 1-2+ bilateral lower extremity pitting edema with no cyanosis, or clubbing.  NEUROLOGIC: Cranial nerves II through XII are intact. Muscle strength 5/5 in all extremities. Sensation intact. Gait not checked.  PSYCHIATRIC: The patient is  alert and oriented x 3.  Normal affect and good eye contact. SKIN: No obvious rash, lesion, or ulcer.   LABORATORY PANEL:   CBC Recent Labs  Lab 12/23/23 1651  WBC 7.3  HGB 14.1  HCT 43.2  PLT 194   ------------------------------------------------------------------------------------------------------------------  Chemistries  Recent Labs  Lab 12/23/23 1651  NA 140  K 4.0  CL 101  CO2 24  GLUCOSE 127*  BUN 58*  CREATININE 4.75*  CALCIUM  9.2  AST 109*  ALT 58*  ALKPHOS 75  BILITOT 1.7*   ------------------------------------------------------------------------------------------------------------------  Cardiac Enzymes No results for input(s): TROPONINI in the last 168 hours. ------------------------------------------------------------------------------------------------------------------  RADIOLOGY:  DG Chest 2 View Result Date: 12/23/2023 EXAM: 2 VIEW(S) XRAY OF THE CHEST 12/23/2023 09:11:00 PM COMPARISON: 12/16/2022, 07/28/2023 CLINICAL HISTORY: Shortness of breath FINDINGS: LUNGS AND PLEURA: No focal pulmonary opacity. Vascular congestion and pulmonary edema appear worse. No sizable pleural  effusion. No pneumothorax. HEART AND MEDIASTINUM: Cardiomegaly appears worse. No acute abnormality of the mediastinal silhouette. BONES AND SOFT TISSUES: No acute osseous abnormality. IMPRESSION: 1. Cardiomegaly with vascular congestion and pulmonary edema, worsened in appearance. 2. No sizable pleural effusion. Electronically signed by: Luke Bun MD 12/23/2023 09:17 PM EDT RP Workstation: [id number]      IMPRESSION AND PLAN:  Assessment and Plan: Acute on chronic combined systolic and diastolic CHF (congestive heart failure) (HCC)  -The patient will be admitted to a cardiac telemetry bed. - We will continue diuresis with IV Lasix . - We Will follow serial troponins. - We will follow I's and O's and daily weights. - Cardiology consult can be obtained if he has no improvement.  Acute lower UTI - Will place the patient on IV Rocephin  and follow urine culture and sensitivity  Paroxysmal atrial fibrillation (HCC) - Will continue amiodarone  and Eliquis .  Hypothyroidism - Will continue Synthroid   Gout - Will continue allopurinol .  Pancreatic insufficiency - Will continue Creon .  Type 2 diabetes mellitus with chronic kidney disease, without long-term current use of insulin  (HCC) - The patient will be placed on supplemental coverage with NovoLog .. - Will continue Glucotrol XL.    Dyslipidemia - Will continue statin therapy.   DVT prophylaxis: Eliquis . Advanced Care Planning:  Code Status: full code.  Family Communication:  The plan of care was discussed in details with the patient (and family). I answered all questions. The patient agreed to proceed with the above mentioned plan. Further management will depend upon hospital course. Disposition Plan: Back to previous home environment Consults called: none.  All the records are reviewed and case discussed with ED provider.  Status is: Observation  I certify that at the time of admission, it is my clinical judgment that the  patient will require hospital care extending less than 2 midnights.                            Dispo: The patient is from: Home              Anticipated d/c is to: Home              Patient currently is not medically stable to d/c.              Difficult to place patient: No  Madison DELENA Peaches M.D on 12/24/2023 at 1:40 AM  [facility]   From 7 PM-7 AM, contact night-coverage [email or web address]  CC: Primary care physician; [name], MD

## 2023-12-23 NOTE — ED Provider Notes (Signed)
 [facility] Provider Note    Event Date/Time   First MD Initiated Contact with Patient 12/23/23 1948     (approximate)   History   Abdominal Pain   HPI  Hector [name] is a 58 y.o. male with a history of CKD stage IV, HFrEF, diabetes, hypertension, hyperlipidemia, paroxysmal atrial fibrillation, alcoholic cardiomyopathy who presents with upper abdominal discomfort and distention has been present for several weeks, along with worsening bilateral leg swelling and increasing shortness of breath.  The patient states he mostly sleeps on his side but had to sleep sitting up for the last few nights.  He gets short of breath with minimal exertion such as walking across the room.  He states that he was just admitted for this and was given a dose of Lasix  in the ED but is not on diuretics at home.  He states that the abdominal discomfort is unchanged from his recent admission, and has not worsened at all.  He denies any vomiting or diarrhea.  Reviewed the past medical records.  The patient was admitted from 10/23 through 10/26 with worsening abdominal pain and distention.  His symptoms were thought to be due to acute on chronic CHF.  CT abdomen was negative for acute intra-abdominal findings.   Physical Exam   Triage Vital Signs: ED Triage Vitals  Encounter Vitals Group     BP 12/23/23 1634 (!) 129/92     Girls Systolic BP Percentile --      Girls Diastolic BP Percentile --      Boys Systolic BP Percentile --      Boys Diastolic BP Percentile --      Pulse Rate 12/23/23 1634 83     Resp 12/23/23 1634 18     Temp 12/23/23 1634 98.6 F (37 C)     Temp Source 12/23/23 1634 Oral     SpO2 12/23/23 1634 95 %     Weight 12/23/23 1632 280 lb (127 kg)     Height 12/23/23 1632 5' 11 (1.803 m)     Head Circumference --      Peak Flow --      Pain Score 12/23/23 1632 7     Pain Loc --      Pain Education --      Exclude from Growth Chart --     Most recent  vital signs: Vitals:   12/23/23 2127 12/23/23 2140  BP:  (!) 128/112  Pulse:  (!) 107  Resp:  19  Temp: 98.4 F (36.9 C)   SpO2:  95%     General: Awake, no distress.  CV:  Good peripheral perfusion.  Resp:  Increased effort.  Diminished breath sounds bilaterally. Abd:  Mild distention.  Appears somewhat edematous. Other:  2+ bilateral lower extremity edema.   ED Results / Procedures / Treatments   Labs (all labs ordered are listed, but only abnormal results are displayed) Labs Reviewed  COMPREHENSIVE METABOLIC PANEL WITH GFR - Abnormal; Notable for the following components:      Result Value   Glucose, Bld 127 (*)    BUN 58 (*)    Creatinine, Ser 4.75 (*)    AST 109 (*)    ALT 58 (*)    Total Bilirubin 1.7 (*)    GFR, Estimated 13 (*)    All other components within normal limits  CBC - Abnormal; Notable for the following components:   RDW 18.3 (*)    All other components  within normal limits  URINALYSIS, ROUTINE W REFLEX MICROSCOPIC - Abnormal; Notable for the following components:   Color, Urine YELLOW (*)    APPearance CLEAR (*)    Glucose, UA >=500 (*)    Hgb urine dipstick MODERATE (*)    Protein, ur 100 (*)    Leukocytes,Ua SMALL (*)    Bacteria, UA RARE (*)    All other components within normal limits  BRAIN NATRIURETIC PEPTIDE - Abnormal; Notable for the following components:   B Natriuretic Peptide 552.3 (*)    All other components within normal limits  LIPASE, BLOOD  BASIC METABOLIC PANEL WITH GFR  CBC     EKG  ED ECG REPORT I, Waylon Cassis, the attending physician, personally viewed and interpreted this ECG.  Date: 12/23/2023 EKG Time: 2153 Rate: 95 Rhythm: Atrial flutter QRS Axis: normal Intervals: normal ST/T Wave abnormalities: None specific IVCD Narrative Interpretation: no evidence of acute ischemia; no significant change when compared to EKG of 12/16/2023    RADIOLOGY  Chest x-ray: I independently viewed and interpreted  the images; there are bilateral interstitial opacities consistent with edema  PROCEDURES:  Critical Care performed: No  Procedures   MEDICATIONS ORDERED IN ED: Medications  allopurinol  (ZYLOPRIM ) tablet 100 mg (has no administration in time range)  amiodarone  (PACERONE ) tablet 200 mg (has no administration in time range)  atorvastatin  (LIPITOR ) tablet 80 mg (has no administration in time range)  glipiZIDE (GLUCOTROL XL) 24 hr tablet 5 mg (has no administration in time range)  lipase/protease/amylase (CREON ) capsule 36,000 Units (has no administration in time range)  apixaban  (ELIQUIS ) tablet 5 mg (5 mg Oral Given 12/23/23 2205)  methocarbamol  (ROBAXIN ) tablet 500 mg (has no administration in time range)  lidocaine  (LIDODERM ) 5 % 1 patch (1 patch Transdermal Patch Applied 12/23/23 2205)  acetaminophen  (TYLENOL ) tablet 650 mg (has no administration in time range)    Or  acetaminophen  (TYLENOL ) suppository 650 mg (has no administration in time range)  traZODone  (DESYREL ) tablet 25 mg (has no administration in time range)  magnesium  hydroxide (MILK OF MAGNESIA) suspension 30 mL (has no administration in time range)  ondansetron  (ZOFRAN ) tablet 4 mg (has no administration in time range)    Or  ondansetron  (ZOFRAN ) injection 4 mg (has no administration in time range)  furosemide  (LASIX ) injection 40 mg (has no administration in time range)  furosemide  (LASIX ) injection 80 mg (80 mg Intravenous Given 12/23/23 2031)  ondansetron  (ZOFRAN ) injection 4 mg (4 mg Intravenous Given 12/23/23 2031)  morphine  (PF) 2 MG/ML injection 2 mg (2 mg Intravenous Given 12/23/23 2030)     IMPRESSION / MDM / ASSESSMENT AND PLAN / ED COURSE  I reviewed the triage vital signs and the nursing notes.  58 year old male with PMH as noted above presents with abdominal distention and discomfort, but also with increasing shortness of breath and edema.  He was just admitted to the hospital with similar symptoms last  week and discharged several days ago.  He states that the abdominal discomfort is unchanged from this recent admission but the other symptoms have worsened.  Differential diagnosis includes, but is not limited to, CHF exacerbation, fluid overload noted other etiology, gastritis, gastroenteritis, gastroparesis, pancreatitis.  Overall I suspect most likely CHF is the primary culprit.  Since his abdominal pain is not any worsened from his recent admission and appears to now be a subacute symptom, there is no indication for repeat abdominal imaging.  We will obtain lab workup, chest x-ray, give a dose of  IV Lasix , and reassess.  Patient's presentation is most consistent with acute presentation with potential threat to life or bodily function.  The patient is on the cardiac monitor to evaluate for evidence of arrhythmia and/or significant heart rate changes.  ----------------------------------------- 10:41 PM on 12/23/2023 -----------------------------------------  Chest x-ray shows worsening edema.  Urinalysis shows WBCs which appear to be chronic.  I have low suspicion for UTI.  BNP is elevated.  CMP shows chronically elevated creatinine.  Overall presentation is consistent with CHF.  I consulted Dr. Lawence from the hospitalist service; based on our discussion he agrees to evaluate the patient for admission.   FINAL CLINICAL IMPRESSION(S) / ED DIAGNOSES   Final diagnoses:  Acute on chronic congestive heart failure, unspecified heart failure type (HCC)     Rx / DC Orders   ED Discharge Orders     None        Note:  This document was prepared using Dragon voice recognition software and may include unintentional dictation errors.    Jacolyn Pae, MD 12/23/23 2241

## 2023-12-23 NOTE — ED Notes (Signed)
 Pt received morphine . Refuses to lay in bed. Sitting at edge of bed.

## 2023-12-24 DIAGNOSIS — N184 Chronic kidney disease, stage 4 (severe): Secondary | ICD-10-CM

## 2023-12-24 DIAGNOSIS — I480 Paroxysmal atrial fibrillation: Secondary | ICD-10-CM | POA: Diagnosis present

## 2023-12-24 DIAGNOSIS — Z6841 Body Mass Index (BMI) 40.0 and over, adult: Secondary | ICD-10-CM | POA: Diagnosis not present

## 2023-12-24 DIAGNOSIS — Z794 Long term (current) use of insulin: Secondary | ICD-10-CM | POA: Diagnosis not present

## 2023-12-24 DIAGNOSIS — F1010 Alcohol abuse, uncomplicated: Secondary | ICD-10-CM | POA: Diagnosis present

## 2023-12-24 DIAGNOSIS — I959 Hypotension, unspecified: Secondary | ICD-10-CM | POA: Diagnosis not present

## 2023-12-24 DIAGNOSIS — I132 Hypertensive heart and chronic kidney disease with heart failure and with stage 5 chronic kidney disease, or end stage renal disease: Secondary | ICD-10-CM | POA: Diagnosis present

## 2023-12-24 DIAGNOSIS — E1122 Type 2 diabetes mellitus with diabetic chronic kidney disease: Secondary | ICD-10-CM

## 2023-12-24 DIAGNOSIS — E038 Other specified hypothyroidism: Secondary | ICD-10-CM | POA: Diagnosis not present

## 2023-12-24 DIAGNOSIS — K8689 Other specified diseases of pancreas: Secondary | ICD-10-CM | POA: Insufficient documentation

## 2023-12-24 DIAGNOSIS — E66813 Obesity, class 3: Secondary | ICD-10-CM | POA: Diagnosis present

## 2023-12-24 DIAGNOSIS — I2510 Atherosclerotic heart disease of native coronary artery without angina pectoris: Secondary | ICD-10-CM | POA: Diagnosis present

## 2023-12-24 DIAGNOSIS — I5020 Unspecified systolic (congestive) heart failure: Secondary | ICD-10-CM | POA: Diagnosis not present

## 2023-12-24 DIAGNOSIS — Z7901 Long term (current) use of anticoagulants: Secondary | ICD-10-CM | POA: Diagnosis not present

## 2023-12-24 DIAGNOSIS — H109 Unspecified conjunctivitis: Secondary | ICD-10-CM | POA: Diagnosis present

## 2023-12-24 DIAGNOSIS — M109 Gout, unspecified: Secondary | ICD-10-CM | POA: Diagnosis present

## 2023-12-24 DIAGNOSIS — E785 Hyperlipidemia, unspecified: Secondary | ICD-10-CM | POA: Diagnosis present

## 2023-12-24 DIAGNOSIS — N2581 Secondary hyperparathyroidism of renal origin: Secondary | ICD-10-CM | POA: Diagnosis present

## 2023-12-24 DIAGNOSIS — N390 Urinary tract infection, site not specified: Secondary | ICD-10-CM

## 2023-12-24 DIAGNOSIS — I5023 Acute on chronic systolic (congestive) heart failure: Secondary | ICD-10-CM | POA: Diagnosis present

## 2023-12-24 DIAGNOSIS — Z992 Dependence on renal dialysis: Secondary | ICD-10-CM | POA: Diagnosis not present

## 2023-12-24 DIAGNOSIS — I428 Other cardiomyopathies: Secondary | ICD-10-CM | POA: Diagnosis present

## 2023-12-24 DIAGNOSIS — N179 Acute kidney failure, unspecified: Secondary | ICD-10-CM | POA: Diagnosis present

## 2023-12-24 DIAGNOSIS — I4892 Unspecified atrial flutter: Secondary | ICD-10-CM | POA: Diagnosis present

## 2023-12-24 DIAGNOSIS — N289 Disorder of kidney and ureter, unspecified: Secondary | ICD-10-CM | POA: Diagnosis not present

## 2023-12-24 DIAGNOSIS — J9601 Acute respiratory failure with hypoxia: Secondary | ICD-10-CM | POA: Diagnosis present

## 2023-12-24 DIAGNOSIS — N186 End stage renal disease: Secondary | ICD-10-CM | POA: Diagnosis present

## 2023-12-24 DIAGNOSIS — E039 Hypothyroidism, unspecified: Secondary | ICD-10-CM | POA: Diagnosis present

## 2023-12-24 DIAGNOSIS — I255 Ischemic cardiomyopathy: Secondary | ICD-10-CM | POA: Diagnosis not present

## 2023-12-24 DIAGNOSIS — D631 Anemia in chronic kidney disease: Secondary | ICD-10-CM | POA: Diagnosis present

## 2023-12-24 LAB — BASIC METABOLIC PANEL WITH GFR
Anion gap: 17 — ABNORMAL HIGH (ref 5–15)
BUN: 53 mg/dL — ABNORMAL HIGH (ref 6–20)
CO2: 22 mmol/L (ref 22–32)
Calcium: 8.6 mg/dL — ABNORMAL LOW (ref 8.9–10.3)
Chloride: 101 mmol/L (ref 98–111)
Creatinine, Ser: 4.61 mg/dL — ABNORMAL HIGH (ref 0.61–1.24)
GFR, Estimated: 14 mL/min — ABNORMAL LOW (ref >60)
Glucose, Bld: 97 mg/dL (ref 70–99)
Potassium: 4.4 mmol/L (ref 3.5–5.1)
Sodium: 140 mmol/L (ref 135–145)

## 2023-12-24 LAB — CBC
HCT: 43.4 % (ref 39.0–52.0)
Hemoglobin: 13.8 g/dL (ref 13.0–17.0)
MCH: 28.2 pg (ref 26.0–34.0)
MCHC: 31.8 g/dL (ref 30.0–36.0)
MCV: 88.8 fL (ref 80.0–100.0)
Platelets: 191 K/uL (ref 150–400)
RBC: 4.89 MIL/uL (ref 4.22–5.81)
RDW: 18.2 % — ABNORMAL HIGH (ref 11.5–15.5)
WBC: 8.3 K/uL (ref 4.0–10.5)
nRBC: 0 % (ref 0.0–0.2)

## 2023-12-24 MED ORDER — LEVOTHYROXINE SODIUM 25 MCG PO TABS
125.0000 ug | ORAL_TABLET | Freq: Every day | ORAL | Status: DC
  Administered 2023-12-25 – 2024-01-07 (×14): 125 ug via ORAL
  Filled 2023-12-24 (×13): qty 1

## 2023-12-24 MED ORDER — INSULIN ASPART 100 UNIT/ML IJ SOLN: 0.0000 [IU] | Freq: Every day | INTRAMUSCULAR | Status: DC

## 2023-12-24 MED ORDER — SODIUM CHLORIDE 0.9 % IV SOLN
2.0000 g | INTRAVENOUS | Status: DC
  Administered 2023-12-24 – 2023-12-25 (×2): 2 g via INTRAVENOUS
  Filled 2023-12-24 (×2): qty 20

## 2023-12-24 MED ORDER — INSULIN ASPART 100 UNIT/ML IJ SOLN: 0.0000 [IU] | Freq: Three times a day (TID) | INTRAMUSCULAR | Status: DC

## 2023-12-24 MED ORDER — MORPHINE SULFATE (PF) 2 MG/ML IV SOLN
2.0000 mg | INTRAVENOUS | Status: DC | PRN
  Administered 2023-12-24 – 2023-12-29 (×19): 2 mg via INTRAVENOUS
  Filled 2023-12-24 (×20): qty 1

## 2023-12-24 NOTE — Assessment & Plan Note (Addendum)
 Continue statin therapy

## 2023-12-24 NOTE — Assessment & Plan Note (Addendum)
 Hemoglobin A1c 7.4.  Continue to monitor with diet

## 2023-12-24 NOTE — Assessment & Plan Note (Addendum)
 Ruled out  Urine culture negative

## 2023-12-24 NOTE — Assessment & Plan Note (Addendum)
 Continue amiodarone and Eliquis

## 2023-12-24 NOTE — ED Notes (Signed)
 Pt refused blood sugar checks due to not being a diabetic, provider notified.

## 2023-12-24 NOTE — Progress Notes (Signed)
 Progress Note   Patient: Hector [name] FMW:[id number] DOB: May 24, 1965 DOA: 12/23/2023     0 DOS: the patient was seen and examined on 12/24/2023   Brief hospital course: Hector [name] is a 58 y.o. 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  Patient also had 50 pounds of weight gain over the past few months. Patient was given IV Lasix , followed by heart failure team as well as nephrology   Principal Problem:   Acute on chronic systolic CHF (congestive heart failure) (HCC) Active Problems:   Acute on chronic combined systolic and diastolic CHF (congestive heart failure) (HCC)   Acute lower UTI   Paroxysmal atrial fibrillation (HCC)   Hypothyroidism   CKD (chronic kidney disease) stage 4, GFR 15-29 ml/min (HCC)   Gout   Dyslipidemia   Type 2 diabetes mellitus with chronic kidney disease, without long-term current use of insulin  (HCC)   Pancreatic insufficiency   Assessment and Plan: Acute on chronic combined systolic and diastolic CHF (congestive heart failure) (HCC) Acute hypoxemic respiratory failure secondary to CHF exacerbation. Patient has increased short of breath, paroxysmal nocturnal dyspnea, orthopnea, significant weight gain over 15 pounds.  Elevated BNP, chest x-ray showed pulmonary edema. Frequent exacerbation congestive heart failure was due to chronic kidney disease. Patient started on IV Lasix , will continue to follow. Very limited option for GDMT due to advanced renal disease.  Will start beta-blocker once acute phase of congestive heart is better.  Chronic kidney disease stage IV. Renal  function still stable after diuretics, but the patient is approaching end-stage of kidney disease.  Obtain nephrology consult.  Acute lower UTI, suspected. Urine culture pending, recent urine culture on 12/16/2023 had insignificant growth.  Currently on Rocephin , will discontinue if urine culture come back negative.  Paroxysmal atrial fibrillation (HCC) - Will continue amiodarone  and Eliquis .  Hypothyroidism - Will continue Synthroid   Gout - Will continue allopurinol .  Pancreatic insufficiency - Will continue Creon .  Type 2 diabetes mellitus with chronic kidney disease, without long-term current use of insulin  (HCC) Continue sliding scale insulin , discontinue oral diabetic medicine.  Dyslipidemia - Will continue statin therapy.  Class II obesity with BMI 39.05. Morbid obesity due to comorbidities. Diet and exercise.     Subjective:  Patient still complaining of significant short of breath with mid exertion.  No cough.  Denies any dysuria or hematuria.  Physical Exam: Vitals:   12/24/23 0446 12/24/23 0700 12/24/23 0729 12/24/23 0900  BP:  (!) 107/96 (!) 120/108 (!) 120/105  Pulse:   86 (!) 107  Resp:  19 (!) 25 20  Temp: 98.2 F (36.8 C)  98 F (36.7 C)   TempSrc: Oral  Oral   SpO2:   92% 92%  Weight:      Height:       General exam: Appears calm and comfortable, obese obese. Respiratory system: A few crackles in the base. Respiratory effort normal. Cardiovascular system: S1 & S2 heard, RRR. No JVD, murmurs, rubs, gallops or clicks.  Gastrointestinal system: Abdomen is distended, soft and nontender. No organomegaly or masses felt. Normal bowel sounds heard. Central nervous system: Alert and oriented. No focal neurological deficits. Extremities: 2+ leg edema Skin: No rashes,  lesions or ulcers Psychiatry: Judgement and insight appear normal. Mood & affect appropriate.    Data Reviewed:  Reviewed lab results, chest x-ray results.  Family Communication:  None  Disposition: Status is: Inpatient Remains inpatient appropriate because: Severity of disease, IV treatment.     Time spent: 55 minutes  Author: Murvin Mana, MD 12/24/2023 10:51 AM  For on call review [email or web address].

## 2023-12-24 NOTE — Assessment & Plan Note (Deleted)
-  The patient will be admitted to a cardiac telemetry bed. - We will continue diuresis with IV Lasix . - We Will follow serial troponins. - We will follow I's and O's and daily weights. - Cardiology consult can be obtained if he has no improvement.

## 2023-12-24 NOTE — Assessment & Plan Note (Addendum)
 Continue Creon .

## 2023-12-24 NOTE — Progress Notes (Signed)
 Heart Failure Navigator Progress Note Assessed for Heart & Vascular TOC clinic readiness.  Patient is currently an Advanced Heart Failure Team patient.  He has a scheduled appointment on 12/30/2023 @ 10:30 AM at the Heart Failure Clinic.   Information added to patient  AVS for when he is discharged.  Navigator will sign off at this time.  Charmaine Pines, RN, BSN [facility] Heart Failure Navigator Secure Chat Only

## 2023-12-24 NOTE — ED Notes (Signed)
 Lab called and stated BMP needed redraw and would send someone for redraw.

## 2023-12-24 NOTE — Assessment & Plan Note (Addendum)
 Continue allopurinol 

## 2023-12-24 NOTE — Hospital Course (Addendum)
 Hector [name] is a 58 y.o. 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  Patient also had 50 pounds of weight gain over the past few months. Patient was given IV Lasix , consulted nephrology. Changed to Lasix  infusion 11/1. Patient had a worsening renal function after Lasix , permacath was placed 11/4, hemodialysis started.  11/5.  Patient seen at the end of hemodialysis.  Only remove 300 mL secondary to low blood pressure. 11/6.  Patient seen before hemodialysis.  Physical therapy will recommend rehab.  TOC consulted. 11/7.  Patient is constipated and ordered lactulose  and MiraLAX  for constipation. 11/8.  Case discussed with nephrology and will give midodrine prior to dialysis. 11/10.  Encouraged to get up and move around with physical therapy.  Patient must get stronger. 11/10.  Patient sleepy while on dialysis.  He only sleeps during dialysis.  Creatinine 5.66 today.

## 2023-12-24 NOTE — Assessment & Plan Note (Addendum)
 Continue Synthroid 

## 2023-12-25 DIAGNOSIS — I5023 Acute on chronic systolic (congestive) heart failure: Secondary | ICD-10-CM | POA: Diagnosis not present

## 2023-12-25 DIAGNOSIS — E1122 Type 2 diabetes mellitus with diabetic chronic kidney disease: Secondary | ICD-10-CM | POA: Diagnosis not present

## 2023-12-25 DIAGNOSIS — N184 Chronic kidney disease, stage 4 (severe): Secondary | ICD-10-CM | POA: Diagnosis not present

## 2023-12-25 LAB — RENAL FUNCTION PANEL
Albumin: 3.4 g/dL — ABNORMAL LOW (ref 3.5–5.0)
Anion gap: 13 (ref 5–15)
BUN: 57 mg/dL — ABNORMAL HIGH (ref 6–20)
CO2: 25 mmol/L (ref 22–32)
Calcium: 8.5 mg/dL — ABNORMAL LOW (ref 8.9–10.3)
Chloride: 98 mmol/L (ref 98–111)
Creatinine, Ser: 4.95 mg/dL — ABNORMAL HIGH (ref 0.61–1.24)
GFR, Estimated: 13 mL/min — ABNORMAL LOW (ref >60)
Glucose, Bld: 92 mg/dL (ref 70–99)
Phosphorus: 4.1 mg/dL (ref 2.5–4.6)
Potassium: 4.3 mmol/L (ref 3.5–5.1)
Sodium: 136 mmol/L (ref 135–145)

## 2023-12-25 LAB — URINE CULTURE: Culture: <10000 — AB

## 2023-12-25 MED ORDER — FUROSEMIDE 10 MG/ML IJ SOLN
10.0000 mg/h | INTRAVENOUS | Status: DC
  Administered 2023-12-25: 8 mg/h via INTRAVENOUS
  Administered 2023-12-26 – 2023-12-29 (×4): 10 mg/h via INTRAVENOUS
  Filled 2023-12-25 (×6): qty 20

## 2023-12-25 NOTE — Progress Notes (Signed)
 [facility]  ROUNDING NOTE   Subjective:   Hector [name] is a 58 y.o. male with past medical conditions including hypertension, paroxysmal A-fib on Eliquis , CHF with a EF less than 20%, and chronic kidney disease stage IV. Patient presents to ED with shortness of breath and edema.  Patient currently admitted for Acute on chronic systolic CHF (congestive heart failure) (HCC) [I50.23] Acute on chronic congestive heart failure, unspecified heart failure type [facility]) [I50.9]  Patient known to our practice from previous admissions and is followed by [facility] nephrology outpatient.  He states he been taking his medications as prescribed but the swelling has returned. States [facility] has discussed dialysis but voices concerns that it may be too much on his heart.   Labs on ED arrival concerning for creatinine 4.75 with GFR 13, BNP 552. UA appears clear. Urine culture has insufficient growth. Chest xray shows vascular congestion and pulmonary edema.   We have been consulted to assist with management of acute kidney injury   Objective:  Vital signs in last 24 hours:  Temp:  [98 F (36.7 C)-98.6 F (37 C)] 98 F (36.7 C) (11/01 0757) Pulse Rate:  [74-100] 96 (11/01 0757) Resp:  [16-20] 18 (11/01 0757) BP: (101-128)/(86-96) 101/86 (11/01 0757) SpO2:  [90 %-96 %] 96 % (11/01 0757) Weight:  [134.6 kg] 134.6 kg (11/01 0457)  Weight change: 7.593 kg Filed Weights   12/23/23 1632 12/25/23 0457  Weight: 127 kg 134.6 kg    Intake/Output: I/O last 3 completed shifts: In: 580 [P.O.:480; IV Piggyback:100] Out: 2225 [Urine:2225]   Intake/Output this shift:  Total I/O In: 120 [P.O.:120] Out: -   Physical Exam: General: NAD  Head: Normocephalic, atraumatic. Moist oral mucosal membranes  Eyes: Anicteric  Lungs:  Clear to auscultation, normal effort  Heart: Regular rate and rhythm  Abdomen:  Soft, tender with light palpation, distended  Extremities: 3+ (tight) peripheral edema.   Neurologic: Awake, alert, conversant  Skin: Warm,dry, no rash   Basic Metabolic Panel: Recent Labs  Lab 12/19/23 0412 12/23/23 1651 12/24/23 0727 12/25/23 0624  NA 138 140 140 136  K 4.1 4.0 4.4 4.3  CL 100 101 101 98  CO2 24 24 22 25   GLUCOSE 124* 127* 97 92  BUN 71* 58* 53* 57*  CREATININE 5.69* 4.75* 4.61* 4.95*  CALCIUM  8.5* 9.2 8.6* 8.5*  PHOS  --   --   --  4.1    Liver Function Tests: Recent Labs  Lab 12/23/23 1651 12/25/23 0624  AST 109*  --   ALT 58*  --   ALKPHOS 75  --   BILITOT 1.7*  --   PROT 7.5  --   ALBUMIN  3.9 3.4*   Recent Labs  Lab 12/23/23 1651  LIPASE 23   No results for input(s): AMMONIA in the last 168 hours.  CBC: Recent Labs  Lab 12/19/23 0412 12/23/23 1651 12/24/23 0432  WBC 7.7 7.3 8.3  NEUTROABS 5.1  --   --   HGB 14.0 14.1 13.8  HCT 44.7 43.2 43.4  MCV 90.1 86.2 88.8  PLT 183 194 191    Cardiac Enzymes: No results for input(s): CKTOTAL, CKMB, CKMBINDEX, TROPONINI in the last 168 hours.  BNP: Invalid input(s): POCBNP  CBG: No results for input(s): GLUCAP in the last 168 hours.  Microbiology: Results for orders placed or performed during the hospital encounter of 12/23/23  Urine Culture (for pregnant, neutropenic or urologic patients or patients with an indwelling urinary catheter)  Status: Abnormal   Collection Time: 12/23/23  7:40 PM   Specimen: Urine, Catheterized  Result Value Ref Range Status   Specimen Description   Final    URINE, CATHETERIZED Performed at [facility], [address], [place], [place] [address]    Special Requests   Final    NONE Performed at [facility], [address] [place], [place] [address]    Culture (A)  Final    <10,000 COLONIES/mL INSIGNIFICANT GROWTH Performed at [facility], [address], [place], [place] [address]    Report Status 12/25/2023 FINAL  Final    Coagulation Studies: No results for input(s): LABPROT, INR  in the last 72 hours.  Urinalysis: Recent Labs    12/23/23 1940  COLORURINE YELLOW*  LABSPEC 1.007  PHURINE 7.0  GLUCOSEU >=500*  HGBUR MODERATE*  BILIRUBINUR NEGATIVE  KETONESUR NEGATIVE  PROTEINUR 100*  NITRITE NEGATIVE  LEUKOCYTESUR SMALL*      Imaging: DG Chest 2 View Result Date: 12/23/2023 EXAM: 2 VIEW(S) XRAY OF THE CHEST 12/23/2023 09:11:00 PM COMPARISON: 12/16/2022, 07/28/2023 CLINICAL HISTORY: Shortness of breath FINDINGS: LUNGS AND PLEURA: No focal pulmonary opacity. Vascular congestion and pulmonary edema appear worse. No sizable pleural effusion. No pneumothorax. HEART AND MEDIASTINUM: Cardiomegaly appears worse. No acute abnormality of the mediastinal silhouette. BONES AND SOFT TISSUES: No acute osseous abnormality. IMPRESSION: 1. Cardiomegaly with vascular congestion and pulmonary edema, worsened in appearance. 2. No sizable pleural effusion. Electronically signed by: Luke Bun MD 12/23/2023 09:17 PM EDT RP Workstation: [id number]     Medications:    furosemide  (LASIX ) 200 mg in dextrose  5 % 100 mL (2 mg/mL) infusion 8 mg/hr (12/25/23 1057)    allopurinol   100 mg Oral Daily   amiodarone   200 mg Oral Daily   apixaban   5 mg Oral BID   atorvastatin   80 mg Oral Daily   insulin  aspart  0-15 Units Subcutaneous TID WC   insulin  aspart  0-5 Units Subcutaneous QHS   levothyroxine   125 mcg Oral Q0600   lidocaine   1 patch Transdermal Q12H   lipase/protease/amylase  36,000 Units Oral TID AC   acetaminophen  **OR** acetaminophen , magnesium  hydroxide, methocarbamol , morphine  injection, ondansetron  **OR** ondansetron  (ZOFRAN ) IV, traZODone   Assessment/ Plan:  [name] is a 58 y.o.  male with past medical conditions including hypertension, paroxysmal A-fib on Eliquis , CHF with a EF less than 20%, and chronic kidney disease stage IV.  Patient currently admitted for Acute on chronic systolic CHF (congestive heart failure) (HCC) [I50.23] Acute on chronic  congestive heart failure, unspecified heart failure type (HCC) [I50.9]   Acute Kidney Injury on chronic kidney disease stage 4 with baseline creatinine 3.90 and GFR of 17 on 09/13/23. Most recent outpatient labs show creatinine 5.94 with GFR 10 on 12/07/23. Acute kidney injury likely has component of cardiorenal syndrome but also progression of kidney disease.   Chronic kidney disease is secondary to hypertension and CAD.  No recent IV contrast exposure.  Receiving IV furosemide  40 mg twice a daily.  Patient states his urine output has decreased.  Will transition patient to IV furosemide  drip at 8 mg/h and monitor during this weekend.  Discussed with patient that this is likely a progression of his kidney disease and it is time to consider renal replacement therapy.  Hepatitis B labs obtained during previous admission.  Will monitor for now and make a decision on renal replacement therapy on Monday.     Lab Results  Component Value  Date   CREATININE 4.95 (H) 12/25/2023   CREATININE 4.61 (H) 12/24/2023   CREATININE 4.75 (H) 12/23/2023    Intake/Output Summary (Last 24 hours) at 12/25/2023 1221 Last data filed at 12/25/2023 1033 Gross per 24 hour  Intake 600 ml  Output 1450 ml  Net -850 ml    2. Anemia of chronic kidney disease Lab Results  Component Value Date   HGB 13.8 12/24/2023    Hemoglobin acceptable for renal patient, will continue to monitor  3. Secondary Hyperparathyroidism: with outpatient labs: PTH 1207, phosphorus 5.6, calcium  9.4 on 12/07/2023.   Lab Results  Component Value Date   CALCIUM  8.5 (L) 12/25/2023   CAION 1.13 (L) 06/23/2023   CAION 1.10 (L) 06/23/2023   PHOS 4.1 12/25/2023    Will monitor bone minerals during this admission.  Currently acceptable.  4. 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 Home regimen includes glipizide. Most recent hemoglobin A1c is 7.3 on 06/28/2023.     LOS: 1 Glenna Brunkow 11/1/202512:21 PM

## 2023-12-25 NOTE — Progress Notes (Signed)
 Progress Note   Patient: Hector [name] FMW:[id number] DOB: 10-Jan-1966 DOA: 12/23/2023     1 DOS: the patient was seen and examined on 12/25/2023   Brief hospital course: Hector [name] is a 58 y.o. 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  Patient also had 50 pounds of weight gain over the past few months. Patient was given IV Lasix , consulted nephrology. Changed to Lasix  infusion 11/1.   Principal Problem:   Acute on chronic systolic CHF (congestive heart failure) (HCC) Active Problems:   Acute on chronic combined systolic and diastolic CHF (congestive heart failure) (HCC)   Acute lower UTI   Paroxysmal atrial fibrillation (HCC)   Hypothyroidism   CKD (chronic kidney disease) stage 4, GFR 15-29 ml/min (HCC)   Gout   Dyslipidemia   Type 2 diabetes mellitus with chronic kidney disease, without long-term current use of insulin  (HCC)   Pancreatic insufficiency   Assessment and Plan: Acute on chronic combined systolic and diastolic CHF (congestive heart failure) (HCC) Acute hypoxemic respiratory failure secondary to CHF exacerbation. Patient has increased short of breath, paroxysmal nocturnal dyspnea, orthopnea, significant weight gain over 15 pounds.  Elevated BNP, chest x-ray showed pulmonary edema. Frequent exacerbation congestive heart failure was due to chronic kidney disease. Very limited option for GDMT due to advanced renal disease.  Will start beta-blocker once acute phase of congestive heart is better. Patient was given IV Lasix , renal function is slightly worsened today.  Started on Lasix  infusion  per nephrology.   Chronic kidney disease stage IV. Renal function still stable after diuretics, but the patient is approaching end-stage of kidney disease.   Renal function slightly worsened due to diuretics.  Appreciate nephrology consult, patient may eventually need a dialysis.  Acute lower UTI, suspected, ruled out. Urine culture pending, recent urine culture on 12/16/2023 had insignificant growth.  Repeat urine culture still has insignificant growth.  Patient has colonization without UTI.  Discontinue antibiotics   Paroxysmal atrial fibrillation (HCC) Continue Eliquis    Hypothyroidism Synthroid    Gout - Will continue allopurinol .   Pancreatic insufficiency Continue Creon , no additional diarrhea.   Type 2 diabetes mellitus with chronic kidney disease, without long-term current use of insulin  (HCC) Continue sliding scale insulin , discontinue oral diabetic medicine. Patient has been refusing glucose checks,   Dyslipidemia - Will continue statin therapy.   Class III obesity with BMI at 41.39. Diet and exercise.      Subjective:  Complain short of breath with exertion, orthopnea last night.  Physical Exam: Vitals:   12/24/23 1935 12/25/23 0023 12/25/23 0457 12/25/23 0757  BP: 111/87 (!) 126/93 128/87 101/86  Pulse: 87 90 100 96  Resp: 20 20 18 18   Temp: 98.6 F (37 C) 98.2 F (36.8 C) 98.4 F (36.9 C) 98 F (36.7 C)  TempSrc:   Oral   SpO2: 93% 90% 94% 96%  Weight:   134.6 kg   Height:       General exam: Appears calm and comfortable, morbid obese Respiratory system: A few crackles in the base. Respiratory effort normal. Cardiovascular system: S1 & S2 heard, RRR. No JVD, murmurs, rubs, gallops or clicks.  Gastrointestinal system: Abdomen is nondistended, soft and nontender.  No organomegaly or masses felt. Normal bowel sounds heard. Central nervous system: Alert and oriented. No focal neurological deficits. Extremities: 2+ leg edema Skin: No rashes, lesions  or ulcers Psychiatry: Judgement and insight appear normal. Mood & affect appropriate.    Data Reviewed:  Lab results reviewed.  Family Communication: None  Disposition: Status is: Inpatient Remains inpatient appropriate because: Severity of disease, IV treatment.     Time spent: 50 minutes  Author: Murvin Mana, MD 12/25/2023 9:47 AM  For on call review [email or web address].

## 2023-12-25 NOTE — Progress Notes (Signed)
 Patient is refusing CBG finger sticks and insulin . Patient states he does not have diabetes and requested those orders removed.  Dr. Laurita made aware on 10/31.

## 2023-12-26 DIAGNOSIS — N184 Chronic kidney disease, stage 4 (severe): Secondary | ICD-10-CM | POA: Diagnosis not present

## 2023-12-26 DIAGNOSIS — I5023 Acute on chronic systolic (congestive) heart failure: Secondary | ICD-10-CM | POA: Diagnosis not present

## 2023-12-26 DIAGNOSIS — I480 Paroxysmal atrial fibrillation: Secondary | ICD-10-CM | POA: Diagnosis not present

## 2023-12-26 LAB — RENAL FUNCTION PANEL
Albumin: 3.4 g/dL — ABNORMAL LOW (ref 3.5–5.0)
Anion gap: 12 (ref 5–15)
BUN: 61 mg/dL — ABNORMAL HIGH (ref 6–20)
CO2: 24 mmol/L (ref 22–32)
Calcium: 8.6 mg/dL — ABNORMAL LOW (ref 8.9–10.3)
Chloride: 99 mmol/L (ref 98–111)
Creatinine, Ser: 4.94 mg/dL — ABNORMAL HIGH (ref 0.61–1.24)
GFR, Estimated: 13 mL/min — ABNORMAL LOW (ref >60)
Glucose, Bld: 117 mg/dL — ABNORMAL HIGH (ref 70–99)
Phosphorus: 4.7 mg/dL — ABNORMAL HIGH (ref 2.5–4.6)
Potassium: 3.8 mmol/L (ref 3.5–5.1)
Sodium: 135 mmol/L (ref 135–145)

## 2023-12-26 LAB — MAGNESIUM: Magnesium: 2.4 mg/dL (ref 1.7–2.4)

## 2023-12-26 NOTE — Progress Notes (Signed)
 Dr. Laurita notified that patient has been refusing insulin  and ACHS glucose monitoring. MD aware.

## 2023-12-26 NOTE — Plan of Care (Signed)
  Problem: Education: Goal: Knowledge of General Education information will improve Description: Including pain rating scale, medication(s)/side effects and non-pharmacologic comfort measures Outcome: Progressing   Problem: Clinical Measurements: Goal: Will remain free from infection Outcome: Progressing Goal: Respiratory complications will improve Outcome: Progressing   Problem: Clinical Measurements: Goal: Cardiovascular complication will be avoided Outcome: Not Progressing   Problem: Pain Managment: Goal: General experience of comfort will improve and/or be controlled Outcome: Not Progressing

## 2023-12-26 NOTE — Progress Notes (Signed)
 [facility]  ROUNDING NOTE   Subjective:   Hector [name] is a 58 y.o. male with past medical conditions including hypertension, paroxysmal A-fib on Eliquis , CHF with a EF less than 20%, and chronic kidney disease stage IV. Patient presents to ED with shortness of breath and edema.  Patient currently admitted for Acute on chronic systolic CHF (congestive heart failure) (HCC) [I50.23] Acute on chronic congestive heart failure, unspecified heart failure type [facility]) [I50.9]  Patient known to our practice from previous admissions and is followed by [facility] nephrology outpatient.    Update Patient seen sitting at side of bed Remains on room air Lower extremity edema mostly unchanged  Urine output 1725 mL in preceding 24 hours Creatinine relatively unchanged, 4.94   Objective:  Vital signs in last 24 hours:  Temp:  [97.5 F (36.4 C)-98.3 F (36.8 C)] 97.5 F (36.4 C) (11/02 1117) Pulse Rate:  [86-102] 100 (11/02 1117) Resp:  [16-21] 21 (11/02 1117) BP: (108-125)/(84-104) 112/84 (11/02 1117) SpO2:  [90 %-98 %] 97 % (11/02 1117) Weight:  [134.6 kg] 134.6 kg (11/02 0500)  Weight change: 0 kg Filed Weights   12/23/23 1632 12/25/23 0457 12/26/23 0500  Weight: 127 kg 134.6 kg 134.6 kg    Intake/Output: I/O last 3 completed shifts: In: 1017.8 [P.O.:960; I.V.:57.8] Out: 3175 [Urine:3175]   Intake/Output this shift:  Total I/O In: 120 [P.O.:120] Out: -   Physical Exam: General: NAD  Head: Normocephalic, atraumatic. Moist oral mucosal membranes  Eyes: Anicteric  Lungs:  Clear to auscultation, normal effort  Heart: Regular rate and rhythm  Abdomen:  Soft, nontender, distended  Extremities: 3+ peripheral edema.  Neurologic: Awake, alert, conversant  Skin: Warm,dry, no rash   Basic Metabolic Panel: Recent Labs  Lab 12/23/23 1651 12/24/23 0727 12/25/23 0624 12/26/23 0500  NA 140 140 136 135  K 4.0 4.4 4.3 3.8  CL 101 101 98 99  CO2 24 22 25 24   GLUCOSE  127* 97 92 117*  BUN 58* 53* 57* 61*  CREATININE 4.75* 4.61* 4.95* 4.94*  CALCIUM  9.2 8.6* 8.5* 8.6*  MG  --   --   --  2.4  PHOS  --   --  4.1 4.7*    Liver Function Tests: Recent Labs  Lab 12/23/23 1651 12/25/23 0624 12/26/23 0500  AST 109*  --   --   ALT 58*  --   --   ALKPHOS 75  --   --   BILITOT 1.7*  --   --   PROT 7.5  --   --   ALBUMIN  3.9 3.4* 3.4*   Recent Labs  Lab 12/23/23 1651  LIPASE 23   No results for input(s): AMMONIA in the last 168 hours.  CBC: Recent Labs  Lab 12/23/23 1651 12/24/23 0432  WBC 7.3 8.3  HGB 14.1 13.8  HCT 43.2 43.4  MCV 86.2 88.8  PLT 194 191    Cardiac Enzymes: No results for input(s): CKTOTAL, CKMB, CKMBINDEX, TROPONINI in the last 168 hours.  BNP: Invalid input(s): POCBNP  CBG: No results for input(s): GLUCAP in the last 168 hours.  Microbiology: Results for orders placed or performed during the hospital encounter of 12/23/23  Urine Culture (for pregnant, neutropenic or urologic patients or patients with an indwelling urinary catheter)     Status: Abnormal   Collection Time: 12/23/23  7:40 PM   Specimen: Urine, Catheterized  Result Value Ref Range Status   Specimen Description   Final  URINE, CATHETERIZED Performed at [facility], [address], [place], [place] [address]    Special Requests   Final    NONE Performed at [facility], [address] [place], [place] [address]    Culture (A)  Final    <10,000 COLONIES/mL INSIGNIFICANT GROWTH Performed at [facility], [address] [place], [place] [address]    Report Status 12/25/2023 FINAL  Final    Coagulation Studies: No results for input(s): LABPROT, INR in the last 72 hours.  Urinalysis: Recent Labs    12/23/23 1940  COLORURINE YELLOW*  LABSPEC 1.007  PHURINE 7.0  GLUCOSEU >=500*  HGBUR MODERATE*  BILIRUBINUR NEGATIVE  KETONESUR NEGATIVE  PROTEINUR 100*  NITRITE NEGATIVE  LEUKOCYTESUR  SMALL*      Imaging: No results found.    Medications:    furosemide  (LASIX ) 200 mg in dextrose  5 % 100 mL (2 mg/mL) infusion 10 mg/hr (12/26/23 0956)    allopurinol   100 mg Oral Daily   amiodarone   200 mg Oral Daily   apixaban   5 mg Oral BID   atorvastatin   80 mg Oral Daily   insulin  aspart  0-15 Units Subcutaneous TID WC   insulin  aspart  0-5 Units Subcutaneous QHS   levothyroxine   125 mcg Oral Q0600   lidocaine   1 patch Transdermal Q12H   lipase/protease/amylase  36,000 Units Oral TID AC   acetaminophen  **OR** acetaminophen , magnesium  hydroxide, methocarbamol , morphine  injection, ondansetron  **OR** ondansetron  (ZOFRAN ) IV, traZODone   Assessment/ Plan:  Hector [name] is a 58 y.o.  male with past medical conditions including hypertension, paroxysmal A-fib on Eliquis , CHF with a EF less than 20%, and chronic kidney disease stage IV.  Patient currently admitted for Acute on chronic systolic CHF (congestive heart failure) (HCC) [I50.23] Acute on chronic congestive heart failure, unspecified heart failure type (HCC) [I50.9]   Acute Kidney Injury on chronic kidney disease stage 4 with baseline creatinine 3.90 and GFR of 17 on 09/13/23. Most recent outpatient labs show creatinine 5.94 with GFR 10 on 12/07/23. Acute kidney injury likely has component of cardiorenal syndrome but also progression of kidney disease.   Chronic kidney disease is secondary to hypertension and CAD.  No recent IV contrast exposure.    Renal function remains unchanged.  Good urine output recorded however likely started later in the day.  Will increase IV furosemide  drip to 10 mg/h.  Discussed with patient our desire to proceed with dialysis if adequate results from achieved by early next week.  Can consider adding metolazone  2.5 mg 3 times weekly.  If needed.  Will hold off today.   Lab Results  Component Value Date   CREATININE 4.94 (H) 12/26/2023   CREATININE 4.95 (H) 12/25/2023   CREATININE 4.61  (H) 12/24/2023    Intake/Output Summary (Last 24 hours) at 12/26/2023 1121 Last data filed at 12/26/2023 1015 Gross per 24 hour  Intake 537.8 ml  Output 1575 ml  Net -1037.2 ml    2. Anemia of chronic kidney disease Lab Results  Component Value Date   HGB 13.8 12/24/2023    Hemoglobin within optimal range for renal patient.  3. Secondary Hyperparathyroidism: with outpatient labs: PTH 1207, phosphorus 5.6, calcium  9.4 on 12/07/2023.   Lab Results  Component Value Date   CALCIUM  8.6 (L) 12/26/2023   CAION 1.13 (L) 06/23/2023   CAION 1.10 (L) 06/23/2023   PHOS 4.7 (H) 12/26/2023    Calcium  and phosphorus are within optimal range.  4. Diabetes  mellitus type II with chronic kidney disease/renal manifestations: noninsulin dependent. Home regimen includes glipizide. Most recent hemoglobin A1c is 7.3 on 06/28/2023.   Sliding scale insulin  managed by primary team.   LOS: 2 Vance Belcourt 11/2/202511:21 AM

## 2023-12-26 NOTE — Progress Notes (Signed)
 Progress Note   Patient: Hector [name] FMW:[id number] DOB: 21-Aug-1965 DOA: 12/23/2023     2 DOS: the patient was seen and examined on 12/26/2023   Brief hospital course: Hector [name] is a 58 y.o. 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  Patient also had 50 pounds of weight gain over the past few months. Patient was given IV Lasix , consulted nephrology. Changed to Lasix  infusion 11/1.   Principal Problem:   Acute on chronic systolic CHF (congestive heart failure) (HCC) Active Problems:   Acute on chronic combined systolic and diastolic CHF (congestive heart failure) (HCC)   Acute lower UTI   Paroxysmal atrial fibrillation (HCC)   Hypothyroidism   CKD (chronic kidney disease) stage 4, GFR 15-29 ml/min (HCC)   Gout   Dyslipidemia   Type 2 diabetes mellitus with chronic kidney disease, without long-term current use of insulin  (HCC)   Pancreatic insufficiency   Assessment and Plan: Acute on chronic combined systolic and diastolic CHF (congestive heart failure) (HCC) Acute hypoxemic respiratory failure secondary to CHF exacerbation. Patient has increased short of breath, paroxysmal nocturnal dyspnea, orthopnea, significant weight gain over 15 pounds.  Elevated BNP, chest x-ray showed pulmonary edema. Frequent exacerbation congestive heart failure was due to chronic kidney disease. Very limited option for GDMT due to advanced renal disease.  Will start beta-blocker once acute phase of congestive heart is better. Patient was given IV Lasix , renal function is slightly worsened today.  Started on Lasix  infusion  per nephrology 11/1.  Patient still has significant volume overload, continue IV Lasix  infusion.   Chronic kidney disease stage IV. Renal function still stable after diuretics, but the patient is approaching end-stage of kidney disease.   Renal function slightly worsened due to diuretics.  Appreciate nephrology consult, patient may eventually need a dialysis. Renal function is slightly better after IV Lasix  infusion.   Acute lower UTI, suspected, ruled out. Urine culture pending, recent urine culture on 12/16/2023 had insignificant growth.  Repeat urine culture still has insignificant growth.  Patient has colonization without UTI.  Discontinued antibiotics   Paroxysmal atrial fibrillation (HCC) Continue Eliquis    Hypothyroidism Synthroid    Gout - Will continue allopurinol .   Pancreatic insufficiency Continue Creon , no additional diarrhea.   Type 2 diabetes mellitus with chronic kidney disease, without long-term current use of insulin  (HCC) Continue sliding scale insulin , discontinue oral diabetic medicine. Patient has been refusing glucose checks,   Dyslipidemia - Will continue statin therapy.   Class III obesity with BMI at 41.39. Diet and exercise.          Subjective:  Patient feels better today, but still has significant leg edema.  Shortness of breath is better.  He still refused all glucose checks.  Physical Exam: Vitals:   12/26/23 0500 12/26/23 0507 12/26/23 0546 12/26/23 1117  BP:  (!) 125/94  112/84  Pulse:  92  100  Resp:  20 20 (!) 21  Temp:  98.3 F (36.8 C)  (!) 97.5 F (36.4 C)  TempSrc:    Oral  SpO2:  98%  97%  Weight: 134.6 kg     Height:       General exam: Appears calm and comfortable,  morbid obesity. Respiratory system: Deceased breath sounds. Respiratory effort normal. Cardiovascular system: S1 & S2 heard, RRR. No JVD, murmurs, rubs, gallops or clicks.  Gastrointestinal system: Abdomen is nondistended, soft and nontender. No  organomegaly or masses felt. Normal bowel sounds heard. Central nervous system: Alert and oriented. No focal neurological deficits. Extremities:2-3+ leg edema Skin: No rashes, lesions or ulcers Psychiatry: Judgement and insight appear normal. Mood & affect appropriate.    Data Reviewed:  Lab results reviewed.  Family Communication: None  Disposition: Status is: Inpatient Remains inpatient appropriate because: Severity of disease, IV treatment.     Time spent: 35 minutes  Author: Murvin Mana, MD 12/26/2023 12:34 PM  For on call review [email or web address].

## 2023-12-27 DIAGNOSIS — I5023 Acute on chronic systolic (congestive) heart failure: Secondary | ICD-10-CM | POA: Diagnosis not present

## 2023-12-27 DIAGNOSIS — N184 Chronic kidney disease, stage 4 (severe): Secondary | ICD-10-CM | POA: Diagnosis not present

## 2023-12-27 DIAGNOSIS — I480 Paroxysmal atrial fibrillation: Secondary | ICD-10-CM | POA: Diagnosis not present

## 2023-12-27 LAB — RENAL FUNCTION PANEL
Albumin: 3.6 g/dL (ref 3.5–5.0)
Anion gap: 12 (ref 5–15)
BUN: 63 mg/dL — ABNORMAL HIGH (ref 6–20)
CO2: 24 mmol/L (ref 22–32)
Calcium: 8.8 mg/dL — ABNORMAL LOW (ref 8.9–10.3)
Chloride: 98 mmol/L (ref 98–111)
Creatinine, Ser: 4.91 mg/dL — ABNORMAL HIGH (ref 0.61–1.24)
GFR, Estimated: 13 mL/min — ABNORMAL LOW (ref >60)
Glucose, Bld: 110 mg/dL — ABNORMAL HIGH (ref 70–99)
Phosphorus: 4.9 mg/dL — ABNORMAL HIGH (ref 2.5–4.6)
Potassium: 4.1 mmol/L (ref 3.5–5.1)
Sodium: 134 mmol/L — ABNORMAL LOW (ref 135–145)

## 2023-12-27 MED ORDER — ORAL CARE MOUTH RINSE: 15.0000 mL | OROMUCOSAL | Status: DC | PRN

## 2023-12-27 MED ORDER — METOLAZONE 2.5 MG PO TABS
2.5000 mg | ORAL_TABLET | Freq: Once | ORAL | Status: AC
  Administered 2023-12-27: 2.5 mg via ORAL
  Filled 2023-12-27: qty 1

## 2023-12-27 NOTE — TOC Initial Note (Signed)
 Transition of Care [facility]) - Initial/Assessment Note    Patient Details  Name: Hector [name] MRN: [id number] Date of Birth: May 16, 1965  Transition of [facility]) CM/SW Contact:    Shasta DELENA Daring, RN Phone Number: 12/27/2023, 3:19 PM  Clinical Narrative:                 Readmission Prevention Screen completed. Confirmed PCP is [facility]. Patient lives in single family home with mother and brother. [facility]acy used is [facility] [address]. Able to afford medications. No home health services. Has cane and CPAP at home. Brother or neighbor can transport to appointments. Brother will provide transportation at discharge.  Expected Discharge Plan: Home/Self Care Barriers to Discharge: Continued Medical Work up   Patient Goals and CMS Choice            Expected Discharge Plan and Services     Post Acute Care Choice: NA Living arrangements for the past 2 months: Single Family Home                                      Prior Living Arrangements/Services Living arrangements for the past 2 months: Single Family Home Lives with:: Parents, Siblings Patient language and need for interpreter reviewed:: Yes Do you feel safe going back to the place where you live?: Yes      Need for Family Participation in Patient Care: Yes (Comment) Care giver support system in place?: Yes (comment)   Criminal Activity/Legal Involvement Pertinent to Current Situation/Hospitalization: No - Comment as needed  Activities of Daily Living   ADL Screening (condition at time of admission) Independently performs ADLs?: Yes (appropriate for developmental age) Is the patient deaf or have difficulty hearing?: No Does the patient have difficulty seeing, even when wearing glasses/contacts?: No Does the patient have difficulty concentrating, remembering, or making decisions?: No  Permission Sought/Granted                  Emotional Assessment Appearance:: Appears stated  age Attitude/Demeanor/Rapport: Gracious, Engaged Affect (typically observed): Appropriate Orientation: : Oriented to Situation, Oriented to Place, Oriented to Self, Oriented to  Time Alcohol / Substance Use: Not Applicable Psych Involvement: No (comment)  Admission diagnosis:  Acute on chronic systolic CHF (congestive heart failure) (HCC) [I50.23] Acute on chronic congestive heart failure, unspecified heart failure type (HCC) [I50.9] Patient Active Problem List   Diagnosis Date Noted   Acute lower UTI 12/24/2023   Paroxysmal atrial fibrillation (HCC) 12/24/2023   Dyslipidemia 12/24/2023   Type 2 diabetes mellitus with chronic kidney disease, without long-term current use of insulin  (HCC) 12/24/2023   Pancreatic insufficiency 12/24/2023   Acute on chronic systolic CHF (congestive heart failure) (HCC) 12/24/2023   AKI (acute kidney injury) 12/16/2023   Coronary artery disease due to lipid rich plaque 07/31/2023   Pulmonary embolus (HCC) 07/31/2023   Acute on chronic HFrEF (heart failure with reduced ejection fraction) (HCC) 07/28/2023   Syncope 07/28/2023   COPD with acute exacerbation (HCC) 07/28/2023   Intractable vomiting with nausea 06/21/2023   NSTEMI (non-ST elevated myocardial infarction) (HCC) 06/21/2023   Polycythemia 01/16/2023   Acute pancreatitis 09/26/2022   Abdominal pain, epigastric 04/27/2022   Alcoholic cirrhosis of liver without ascites (HCC) 04/27/2022   CHF exacerbation (HCC) 04/26/2022   Hyperkalemia 04/25/2022   Hyperbilirubinemia 04/25/2022   Thrombocytopenia 04/25/2022   History of GI bleed 04/25/2022   Acute renal failure  superimposed on stage 4 chronic kidney disease (HCC) 02/23/2022   Hyponatremia 02/23/2022   Abdominal pain 02/23/2022   Acute kidney injury superimposed on CKD 02/23/2022   Type II diabetes mellitus with renal manifestations (HCC) 02/23/2022   RSV infection 02/23/2022   HLD (hyperlipidemia) 01/24/2022   Myocardial injury 01/24/2022    Acute on chronic combined systolic and diastolic CHF (congestive heart failure) (HCC) 01/06/2022   Type 2 diabetes mellitus (HCC) 12/10/2021   Pulmonary nodule 12/07/2021   Acute on chronic congestive heart failure (HCC) 11/18/2021   Gout 11/18/2021   Atrial fibrillation with RVR (HCC) 10/13/2021   Obesity (BMI 30-39.9) 10/01/2021   End-stage systolic heart failure, acute on chronic (HCC) 09/28/2021   Right upper quadrant pain 09/10/2021   Nausea and vomiting 09/10/2021   Atrial flutter with rapid ventricular response (HCC) 03/01/2021   Hypothyroidism 11/30/2020   Stroke (HCC)    Alcohol use, unspecified, in remission    Chronic renal impairment, stage 4 (severe)    Chronic systolic CHF (congestive heart failure) (HCC)    Iron  deficiency anemia    Biventricular failure (HCC) 09/12/2020   CKD (chronic kidney disease) stage 4, GFR 15-29 ml/min (HCC) 07/21/2020   UTI (urinary tract infection) 01/30/2020   Acute renal failure superimposed on stage 3b chronic kidney disease (HCC) 12/31/2019   Acute respiratory failure with hypoxia (HCC) 12/22/2019    Class: Acute   OSA (obstructive sleep apnea) 04/21/2016   Nonischemic cardiomyopathy (HCC) 05/26/2015   Chronic combined systolic (congestive) and diastolic (congestive) heart failure (HCC) 04/01/2015   Essential hypertension 04/01/2015   Nonischemic cardiomyopathy: EF 20-25% 09/21/2014   Paroxysmal A-fib (HCC) 08/16/2014   Paroxysmal atrial flutter (HCC) 08/14/2014    Class: Acute   Hypertensive heart disease    Mixed hyperlipidemia    PCP:  [name], MD Pharmacy:   [facility] [address]), [place] - [address] [place] ([address]) [place] [address] Phone: [phone] Fax: [phone]  [facility] - [place], [place] - [address] [place] [address] Phone: [phone] Fax: [phone]  [facility] [address] [place] [address] Phone: [phone] Fax: [phone]     Social Drivers of Health (SDOH) Social History: SDOH Screenings   Food Insecurity: No Food Insecurity (12/16/2023)  Housing: Unknown (12/16/2023)  Transportation Needs: No Transportation Needs (12/16/2023)  Utilities: Not At Risk (12/16/2023)  Alcohol Screen: Low Risk  (01/19/2023)  Depression (PHQ2-9): Low Risk  (08/19/2021)  Financial Resource Strain: Low Risk  (01/19/2023)  Physical Activity: Inactive (01/04/2019)  Social Connections: Unknown (12/16/2023)  Stress: No Stress Concern Present (01/04/2019)  Tobacco Use: Medium Risk (12/23/2023)   SDOH Interventions:     Readmission Risk Interventions    12/27/2023    3:10 PM 04/27/2022   11:48 AM 02/24/2022    3:53 PM  Readmission Risk Prevention Plan  Transportation Screening Complete Complete Complete  Medication Review (RN Care Manager) Complete Complete Complete  PCP or Specialist appointment within 3-5 days of discharge Complete Complete   SW Recovery Care/Counseling Consult Complete Complete Complete  Palliative Care Screening Not Applicable Not Applicable Not Applicable  Skilled Nursing Facility Not Applicable Not Applicable Not Applicable

## 2023-12-27 NOTE — Progress Notes (Signed)
 Pt refuses to apply oxygen despite education provided by this nurse, pt encouraged to apply oxygen, pt insists on refusal.

## 2023-12-27 NOTE — Plan of Care (Signed)

## 2023-12-27 NOTE — Progress Notes (Signed)
 [facility]  ROUNDING NOTE   Subjective:   Hector [name] is a 58 y.o. male with past medical conditions including hypertension, paroxysmal A-fib on Eliquis , CHF with a EF less than 20%, and chronic kidney disease stage IV. Patient presents to ED with shortness of breath and edema.  Patient currently admitted for Acute on chronic systolic CHF (congestive heart failure) (HCC) [I50.23] Acute on chronic congestive heart failure, unspecified heart failure type [facility]) [I50.9]  Patient known to our practice from previous admissions and is followed by [facility] nephrology outpatient.    Update Patient sitting at side of bed States he's sleepy today Very drowsy during visit, states he received some pain medication.  Urine output 1625 mL in preceding 24 hours Creatinine 4.91 IV furosemide  10mg /hr  Objective:  Vital signs in last 24 hours:  Temp:  [97.6 F (36.4 C)-98.2 F (36.8 C)] 98.1 F (36.7 C) (11/03 1157) Pulse Rate:  [77-114] 114 (11/03 1157) Resp:  [12-20] 19 (11/03 1157) BP: (120-124)/(86-99) 123/97 (11/03 1157) SpO2:  [64 %-97 %] 94 % (11/03 1157) Weight:  [134.4 kg] 134.4 kg (11/03 0500)  Weight change: -0.2 kg Filed Weights   12/25/23 0457 12/26/23 0500 12/27/23 0500  Weight: 134.6 kg 134.6 kg 134.4 kg    Intake/Output: I/O last 3 completed shifts: In: 1099.7 [P.O.:900; I.V.:199.7] Out: 2625 [Urine:2625]   Intake/Output this shift:  Total I/O In: 14.8 [I.V.:14.8] Out: 475 [Urine:475]  Physical Exam: General: NAD  Head: Normocephalic, atraumatic. Moist oral mucosal membranes  Eyes: Anicteric  Lungs:  Clear to auscultation, normal effort  Heart: Regular rate and rhythm  Abdomen:  Soft, nontender, distended  Extremities: 3+ peripheral edema.  Neurologic: Awake, drowsy  Skin: Warm,dry, no rash   Basic Metabolic Panel: Recent Labs  Lab 12/23/23 1651 12/24/23 0727 12/25/23 0624 12/26/23 0500 12/27/23 0351  NA 140 140 136 135 134*  K 4.0 4.4  4.3 3.8 4.1  CL 101 101 98 99 98  CO2 24 22 25 24 24   GLUCOSE 127* 97 92 117* 110*  BUN 58* 53* 57* 61* 63*  CREATININE 4.75* 4.61* 4.95* 4.94* 4.91*  CALCIUM  9.2 8.6* 8.5* 8.6* 8.8*  MG  --   --   --  2.4  --   PHOS  --   --  4.1 4.7* 4.9*    Liver Function Tests: Recent Labs  Lab 12/23/23 1651 12/25/23 0624 12/26/23 0500 12/27/23 0351  AST 109*  --   --   --   ALT 58*  --   --   --   ALKPHOS 75  --   --   --   BILITOT 1.7*  --   --   --   PROT 7.5  --   --   --   ALBUMIN  3.9 3.4* 3.4* 3.6   Recent Labs  Lab 12/23/23 1651  LIPASE 23   No results for input(s): AMMONIA in the last 168 hours.  CBC: Recent Labs  Lab 12/23/23 1651 12/24/23 0432  WBC 7.3 8.3  HGB 14.1 13.8  HCT 43.2 43.4  MCV 86.2 88.8  PLT 194 191    Cardiac Enzymes: No results for input(s): CKTOTAL, CKMB, CKMBINDEX, TROPONINI in the last 168 hours.  BNP: Invalid input(s): POCBNP  CBG: No results for input(s): GLUCAP in the last 168 hours.  Microbiology: Results for orders placed or performed during the hospital encounter of 12/23/23  Urine Culture (for pregnant, neutropenic or urologic patients or patients with an indwelling urinary catheter)  Status: Abnormal   Collection Time: 12/23/23  7:40 PM   Specimen: Urine, Catheterized  Result Value Ref Range Status   Specimen Description   Final    URINE, CATHETERIZED Performed at [facility] [address], [place], [place] [address]    Special Requests   Final    NONE Performed at [facility], [address] [place], [place] [address]    Culture (A)  Final    <10,000 COLONIES/mL INSIGNIFICANT GROWTH Performed at [facility], [address], [place], [place] [address]    Report Status 12/25/2023 FINAL  Final    Coagulation Studies: No results for input(s): LABPROT, INR in the last 72 hours.  Urinalysis: No results for input(s): COLORURINE, LABSPEC, PHURINE, GLUCOSEU,  HGBUR, BILIRUBINUR, KETONESUR, PROTEINUR, UROBILINOGEN, NITRITE, LEUKOCYTESUR in the last 72 hours.  Invalid input(s): APPERANCEUR     Imaging: No results found.    Medications:    furosemide  (LASIX ) 200 mg in dextrose  5 % 100 mL (2 mg/mL) infusion 10 mg/hr (12/27/23 1000)    allopurinol   100 mg Oral Daily   amiodarone   200 mg Oral Daily   apixaban   5 mg Oral BID   atorvastatin   80 mg Oral Daily   levothyroxine   125 mcg Oral Q0600   lidocaine   1 patch Transdermal Q12H   lipase/protease/amylase  36,000 Units Oral TID AC   acetaminophen  **OR** acetaminophen , magnesium  hydroxide, methocarbamol , morphine  injection, ondansetron  **OR** ondansetron  (ZOFRAN ) IV, traZODone   Assessment/ Plan:  [name] is a 58 y.o.  male with past medical conditions including hypertension, paroxysmal A-fib on Eliquis , CHF with a EF less than 20%, and chronic kidney disease stage IV.  Patient currently admitted for Acute on chronic systolic CHF (congestive heart failure) (HCC) [I50.23] Acute on chronic congestive heart failure, unspecified heart failure type (HCC) [I50.9]   Acute Kidney Injury on chronic kidney disease stage 4 with baseline creatinine 3.90 and GFR of 17 on 09/13/23. Most recent outpatient labs show creatinine 5.94 with GFR 10 on 12/07/23. Acute kidney injury likely has component of cardiorenal syndrome but also progression of kidney disease.   Chronic kidney disease is secondary to hypertension and CAD.  No recent IV contrast exposure.    Creatinine has not improved. Less than optimal volume removal with IV furosemide , despite increased dose yesterday. Spoke with patient about starting dialysis and he remains hesistent. States he'll make a decision tomorrow. Will continue IV furosemide  and order Metolazone  2.5mg  today. Will continue to monitor urine output and labs.    Lab Results  Component Value Date   CREATININE 4.91 (H) 12/27/2023   CREATININE 4.94 (H)  12/26/2023   CREATININE 4.95 (H) 12/25/2023    Intake/Output Summary (Last 24 hours) at 12/27/2023 1217 Last data filed at 12/27/2023 1159 Gross per 24 hour  Intake 696.7 ml  Output 1750 ml  Net -1053.3 ml    2. Anemia of chronic kidney disease Lab Results  Component Value Date   HGB 13.8 12/24/2023    Hemoglobin stable. No need for ESA at this time.   3. Secondary Hyperparathyroidism: with outpatient labs: PTH 1207, phosphorus 5.6, calcium  9.4 on 12/07/2023.   Lab Results  Component Value Date   CALCIUM  8.8 (L) 12/27/2023   CAION 1.13 (L) 06/23/2023   CAION 1.10 (L) 06/23/2023   PHOS 4.9 (H) 12/27/2023    Calcium  and phosphorus are within optimal range.  4. 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 Home  regimen includes glipizide. Most recent hemoglobin A1c is 7.3 on 06/28/2023.   Sliding scale insulin  managed by primary team.   LOS: 3 Krystal Teachey 11/3/202512:17 PM

## 2023-12-27 NOTE — Progress Notes (Signed)
 Progress Note   Patient: Hector [name] FMW:[id number] DOB: 1965/05/15 DOA: 12/23/2023     3 DOS: the patient was seen and examined on 12/27/2023   Brief hospital course: Hector [name] is a 58 y.o. 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  Patient also had 50 pounds of weight gain over the past few months. Patient was given IV Lasix , consulted nephrology. Changed to Lasix  infusion 11/1.   Principal Problem:   Acute on chronic systolic CHF (congestive heart failure) (HCC) Active Problems:   Acute on chronic combined systolic and diastolic CHF (congestive heart failure) (HCC)   Acute lower UTI   Paroxysmal atrial fibrillation (HCC)   Hypothyroidism   CKD (chronic kidney disease) stage 4, GFR 15-29 ml/min (HCC)   Gout   Dyslipidemia   Type 2 diabetes mellitus with chronic kidney disease, without long-term current use of insulin  (HCC)   Pancreatic insufficiency   Assessment and Plan: Acute on chronic combined systolic and diastolic CHF (congestive heart failure) (HCC) Acute hypoxemic respiratory failure secondary to CHF exacerbation. Patient has increased short of breath, paroxysmal nocturnal dyspnea, orthopnea, significant weight gain over 15 pounds.  Elevated BNP, chest x-ray showed pulmonary edema. Frequent exacerbation congestive heart failure was due to chronic kidney disease. Very limited option for GDMT due to advanced renal disease.  Will start beta-blocker once acute phase of congestive heart is better. Patient was given IV Lasix , renal function is slightly worsened today.  Started on Lasix  infusion  per nephrology 11/1.  Patient still has significant volume overload, urine output is slowing down, renal function still stable, Lasix  dose was increased to 10 mg/h.  Continue to monitor renal function.   Chronic kidney disease stage IV. Renal function still stable after diuretics, but the patient is approaching end-stage of kidney disease.   Renal function slightly worsened due to diuretics.  Appreciate nephrology consult, patient may eventually need a dialysis. As above   Acute lower UTI, suspected, ruled out. Urine culture pending, recent urine culture on 12/16/2023 had insignificant growth.  Repeat urine culture still has insignificant growth.  Patient has colonization without UTI.  Discontinued antibiotics   Paroxysmal atrial fibrillation (HCC) Continue Eliquis    Hypothyroidism Synthroid    Gout - Will continue allopurinol .   Pancreatic insufficiency Continue Creon , no additional diarrhea.   Type 2 diabetes mellitus with chronic kidney disease, without long-term current use of insulin  (HCC) Continue sliding scale insulin , discontinue oral diabetic medicine. Patient has been refusing glucose checks and sliding scale insulin .   Dyslipidemia - Will continue statin therapy.   Class III obesity with BMI at 41.39. Diet and exercise.          Subjective:  Patient still has significant short of breath with exertion, paroxysmal nocturnal dyspnea and orthopnea.  Physical Exam: Vitals:   12/27/23 0401 12/27/23 0500 12/27/23 0838 12/27/23 1157  BP: 120/86  (!) 122/93 (!) 123/97  Pulse: 91  (!) 104 (!) 114  Resp: 20   19  Temp: 98.2 F (36.8 C)  98 F (36.7 C) 98.1 F (36.7 C)  TempSrc:      SpO2: 97%  95% 94%  Weight:  134.4 kg    Height:  General exam: Appears calm and comfortable  Respiratory system: Clear to auscultation. Respiratory effort normal. Cardiovascular system: S1 & S2 heard, RRR. No JVD, murmurs, rubs, gallops or clicks.  Gastrointestinal system:  Abdomen is nondistended, soft and nontender. No organomegaly or masses felt. Normal bowel sounds heard. Central nervous system: Alert and oriented. No focal neurological deficits. Extremities: 2-3+ leg edema Skin: No rashes, lesions or ulcers Psychiatry: Judgement and insight appear normal. Mood & affect appropriate.    Data Reviewed:  Lab results reviewed.  Family Communication: None  Disposition: Status is: Inpatient Remains inpatient appropriate because: Severity of disease, IV treatment.     Time spent: 35 minutes  Author: Murvin Mana, MD 12/27/2023 12:56 PM  For on call review [email or web address].

## 2023-12-27 NOTE — Progress Notes (Signed)
 Heart Failure Stewardship Pharmacy Note  PCP: [name], MD PCP-Cardiologist: Evalene Lunger, MD  HPI: Hector [name] is a 58 y.o. male with heart failure with reduced ejection fraction with EF less than 20%, nonischemic cardiomyopathy, history of alcohol use, paroxysmal atrial fibrillation/flutter on Eliquis , obstructive sleep apnea on CPAP, chronic kidney disease stage IV, CVA, hypertension, hyperlipidemia, type 2 diabetes mellitus who presented with worsening shortness of breath, orthopnea, LEE, and 50 lb weight gain over several months. On admission, BNP was 552.3, HS-troponin was not measured, and lipase 23. Chest x-ray noted vascular congestion and pulmonary edema.   Pertinent cardiac history: Echo in 02/2014 with LVEF of 20-25%. Underwent TEE DCCV in 07/2014 where LVEF was 20-25%. LHC in 12/2014 noted no coronary disease. LVEF in 01/2015 improved to 50-55%. Echo in 08/2020 with LVEF 25-30%.Underwent TEE DCCV in 02/2021 where LVEF was <20%. RHC 3/24 RA 17 PA 58/39 (43) PCWP 33 Fick 3.4/1.5 (PA sat 49%) PAPi 1.2. Echo in 12/2022 showed LVEF <20%, severely reduced RV function, mild MR. Echo 07/2023 with LVEF of 20-25%. TEE during DCCV 07/2023 with LVEF <20%.  Pertinent Lab Values: Creatinine  Date Value Ref Range Status  03/08/2014 2.38 (H) 0.60 - 1.30 mg/dL Final   Creatinine, Ser  Date Value Ref Range Status  12/27/2023 4.91 (H) 0.61 - 1.24 mg/dL Final   BUN  Date Value Ref Range Status  12/27/2023 63 (H) 6 - 20 mg/dL Final  94/79/7974 39 (H) 6 - 24 mg/dL Final  98/85/7983 26 (H) 7 - 18 mg/dL Final   Potassium  Date Value Ref Range Status  12/27/2023 4.1 3.5 - 5.1 mmol/L Final  03/08/2014 3.9 3.5 - 5.1 mmol/L Final   Sodium  Date Value Ref Range Status  12/27/2023 134 (L) 135 - 145 mmol/L Final  07/13/2023 140 134 - 144 mmol/L Final  03/08/2014 135 (L) 136 - 145 mmol/L Final   B Natriuretic Peptide  Date Value Ref Range Status  12/23/2023 552.3 (H) 0.0 -  100.0 pg/mL Final    Comment:    Performed at [facility], [address] [place], [place] [address]   Magnesium   Date Value Ref Range Status  12/26/2023 2.4 1.7 - 2.4 mg/dL Final    Comment:    Performed at [facility], [address] [place], [place] [address]   Hgb A1c MFr Bld  Date Value Ref Range Status  12/18/2023 7.4 (H) 4.8 - 5.6 % Final    Comment:    (NOTE) Diagnosis of Diabetes The following HbA1c ranges recommended by the American Diabetes Association (ADA) may be used as an aid in the diagnosis of diabetes mellitus.  Hemoglobin             Suggested A1C NGSP%              Diagnosis  <5.7                   Non Diabetic  5.7-6.4                Pre-Diabetic  >6.4                   Diabetic  <7.0                   Glycemic control for                       adults with diabetes.  TSH  Date Value Ref Range Status  07/28/2023 39.399 (H) 0.350 - 4.500 uIU/mL Final    Comment:    Performed by a 3rd Generation assay with a functional sensitivity of <=0.01 uIU/mL. Performed at [facility], [address] [place], [place] [address]   10/08/2020 15.900 (H) 0.450 - 4.500 uIU/mL Final   Vital Signs:  Temp:  [97.5 F (36.4 C)-98.2 F (36.8 C)] 98 F (36.7 C) (11/03 0838) Pulse Rate:  [77-104] 104 (11/03 0838) Cardiac Rhythm: Atrial fibrillation (11/03 0800) Resp:  [12-21] 20 (11/03 0401) BP: (112-124)/(84-99) 122/93 (11/03 0838) SpO2:  [64 %-97 %] 95 % (11/03 0838) Weight:  [134.4 kg (296 lb 4.8 oz)] 134.4 kg (296 lb 4.8 oz) (11/03 0500)  Intake/Output Summary (Last 24 hours) at 12/27/2023 0907 Last data filed at 12/27/2023 9160 Gross per 24 hour  Intake 801.86 ml  Output 1700 ml  Net -898.14 ml    Current Heart Failure Medications:  Loop diuretic: furosemide  10 mg/hr infusion Beta-Blocker: none ACEI/ARB/ARNI: none MRA: none SGLT2i: none Other: none   Prior to admission Heart Failure Medications:  Loop diuretic:  torsemide  40 mg daily + weekly metolazone  2.5 mg + 40 meq Kcl. Beta-Blocker: none ACEI/ARB/ARNI: none MRA: none SGLT2i: Jardiance  10 mg daily Other: none   Assessment: 1. Acute on chronic systolic heart failure (LVEF <20%) with severely reduced RV function, due to NICM. NYHA class III-IV symptoms.  -Symptoms: Patient reports feeling slightly improved today. Still has poor appetite, abdominal distention, and shortness of breath. -Volume: Hypervolemic on exam. Reports urine output is ~2L per day. Given his degree of volume overload, this is less than desirable. Currently on furosemide  infusion at 10 mg/hr. Metolazone  being given today. If urine output does not increase substantially 2-3 hours after metolazone , consider increasing furosemide  infusion to 20 mg/hr. -Hemodynamics:  SBP is normal, DBP is elevated, pulse pressures are narrowed -BB: Would not add BB given previous low cardiac output on RHC and current decompensation. -ACEI/ARB/ARNI: Not a candidate at this time given CKD IV. -MRA: Not a candidate at this time given creatinine >2.5 mg/dL. -SGLT2i: Held due to eGFR.  -Other:  Can consider BiDil  0.5 tablet TID given afterload evidenced by narrow pulse pressure.  Plan: 1) Medication changes recommended at this time: -If urine output does not increase substantially 2-3 hours after metolazone , consider increasing furosemide  infusion to 20 mg/hr.  2) Patient assistance: -Pending  3) Education: - Patient has been educated on current HF medications and potential additions to HF medication regimen - Patient verbalizes understanding that over the next few months, these medication doses may change and more medications may be added to optimize HF regimen - Patient has been educated on basic disease state pathophysiology and goals of therapy  Please do not hesitate to reach out with questions or concerns,  Novalyn Lajara, PharmD, CPP, BCPS, [facility] Heart Failure Pharmacist  Phone -  [phone] 12/27/2023 11:07 AM

## 2023-12-28 ENCOUNTER — Encounter
Admission: EM | Disposition: A | Discharge status: Skilled Nursing Facility | Payer: Self-pay | Source: Home / Self Care | Attending: Internal Medicine

## 2023-12-28 ENCOUNTER — Encounter: Payer: Self-pay | Admitting: Vascular Surgery

## 2023-12-28 DIAGNOSIS — I255 Ischemic cardiomyopathy: Secondary | ICD-10-CM

## 2023-12-28 DIAGNOSIS — Z8679 Personal history of other diseases of the circulatory system: Secondary | ICD-10-CM

## 2023-12-28 DIAGNOSIS — I5020 Unspecified systolic (congestive) heart failure: Secondary | ICD-10-CM | POA: Diagnosis not present

## 2023-12-28 DIAGNOSIS — I480 Paroxysmal atrial fibrillation: Secondary | ICD-10-CM | POA: Diagnosis not present

## 2023-12-28 DIAGNOSIS — Z7901 Long term (current) use of anticoagulants: Secondary | ICD-10-CM

## 2023-12-28 DIAGNOSIS — I5023 Acute on chronic systolic (congestive) heart failure: Secondary | ICD-10-CM | POA: Diagnosis not present

## 2023-12-28 DIAGNOSIS — Z4901 Encounter for fitting and adjustment of extracorporeal dialysis catheter: Secondary | ICD-10-CM

## 2023-12-28 DIAGNOSIS — Z79899 Other long term (current) drug therapy: Secondary | ICD-10-CM

## 2023-12-28 DIAGNOSIS — N289 Disorder of kidney and ureter, unspecified: Secondary | ICD-10-CM | POA: Diagnosis not present

## 2023-12-28 DIAGNOSIS — N184 Chronic kidney disease, stage 4 (severe): Secondary | ICD-10-CM | POA: Diagnosis not present

## 2023-12-28 DIAGNOSIS — F1010 Alcohol abuse, uncomplicated: Secondary | ICD-10-CM | POA: Diagnosis not present

## 2023-12-28 HISTORY — PX: DIALYSIS/PERMA CATHETER INSERTION: CATH118288

## 2023-12-28 LAB — RENAL FUNCTION PANEL
Albumin: 3.9 g/dL (ref 3.5–5.0)
Anion gap: 12 (ref 5–15)
BUN: 65 mg/dL — ABNORMAL HIGH (ref 6–20)
CO2: 26 mmol/L (ref 22–32)
Calcium: 8.9 mg/dL (ref 8.9–10.3)
Chloride: 96 mmol/L — ABNORMAL LOW (ref 98–111)
Creatinine, Ser: 5.07 mg/dL — ABNORMAL HIGH (ref 0.61–1.24)
GFR, Estimated: 12 mL/min — ABNORMAL LOW (ref >60)
Glucose, Bld: 100 mg/dL — ABNORMAL HIGH (ref 70–99)
Phosphorus: 5.3 mg/dL — ABNORMAL HIGH (ref 2.5–4.6)
Potassium: 3.8 mmol/L (ref 3.5–5.1)
Sodium: 134 mmol/L — ABNORMAL LOW (ref 135–145)

## 2023-12-28 LAB — CBC
HCT: 38.9 % — ABNORMAL LOW (ref 39.0–52.0)
Hemoglobin: 12.6 g/dL — ABNORMAL LOW (ref 13.0–17.0)
MCH: 27.9 pg (ref 26.0–34.0)
MCHC: 32.4 g/dL (ref 30.0–36.0)
MCV: 86.3 fL (ref 80.0–100.0)
Platelets: 194 K/uL (ref 150–400)
RBC: 4.51 MIL/uL (ref 4.22–5.81)
RDW: 17.8 % — ABNORMAL HIGH (ref 11.5–15.5)
WBC: 8.3 K/uL (ref 4.0–10.5)
nRBC: 0 % (ref 0.0–0.2)

## 2023-12-28 LAB — MAGNESIUM: Magnesium: 2.4 mg/dL (ref 1.7–2.4)

## 2023-12-28 SURGERY — DIALYSIS/PERMA CATHETER INSERTION
Anesthesia: Moderate Sedation

## 2023-12-28 MED ORDER — LIDOCAINE HCL (PF) 1 % IJ SOLN
INTRAMUSCULAR | Status: DC | PRN
  Administered 2023-12-28: 20 mL

## 2023-12-28 MED ORDER — FENTANYL CITRATE (PF) 100 MCG/2ML IJ SOLN
INTRAMUSCULAR | Status: DC | PRN
  Administered 2023-12-28: 25 ug via INTRAVENOUS

## 2023-12-28 MED ORDER — CEFAZOLIN SODIUM-DEXTROSE 2-4 GM/100ML-% IV SOLN
INTRAVENOUS | Status: AC
  Filled 2023-12-28: qty 100

## 2023-12-28 MED ORDER — CHLORHEXIDINE GLUCONATE CLOTH 2 % EX PADS
6.0000 | MEDICATED_PAD | Freq: Every day | CUTANEOUS | Status: DC
  Administered 2023-12-28 – 2024-01-07 (×10): 6 via TOPICAL

## 2023-12-28 MED ORDER — MIDAZOLAM HCL 2 MG/2ML IJ SOLN
INTRAMUSCULAR | Status: AC
  Filled 2023-12-28: qty 2

## 2023-12-28 MED ORDER — METHYLPREDNISOLONE SODIUM SUCC 125 MG IJ SOLR: 125.0000 mg | Freq: Once | INTRAMUSCULAR | Status: DC | PRN

## 2023-12-28 MED ORDER — HEPARIN SODIUM (PORCINE) 1000 UNIT/ML IJ SOLN
INTRAMUSCULAR | Status: AC
  Filled 2023-12-28: qty 4

## 2023-12-28 MED ORDER — CEFAZOLIN SODIUM-DEXTROSE 2-4 GM/100ML-% IV SOLN: 2.0000 g | INTRAVENOUS | Status: DC

## 2023-12-28 MED ORDER — HEPARIN SODIUM (PORCINE) 10000 UNIT/ML IJ SOLN
INTRAMUSCULAR | Status: DC | PRN
  Administered 2023-12-28: 10000 [IU]

## 2023-12-28 MED ORDER — DIPHENHYDRAMINE HCL 50 MG/ML IJ SOLN: 50.0000 mg | Freq: Once | INTRAMUSCULAR | Status: DC | PRN

## 2023-12-28 MED ORDER — HEPARIN (PORCINE) IN NACL 1000-0.9 UT/500ML-% IV SOLN
INTRAVENOUS | Status: DC | PRN
  Administered 2023-12-28: 500 mL

## 2023-12-28 MED ORDER — FAMOTIDINE 20 MG PO TABS: 40.0000 mg | ORAL_TABLET | Freq: Once | ORAL | Status: DC | PRN

## 2023-12-28 MED ORDER — FENTANYL CITRATE (PF) 50 MCG/ML IJ SOSY
PREFILLED_SYRINGE | INTRAMUSCULAR | Status: AC
  Filled 2023-12-28: qty 1

## 2023-12-28 MED ORDER — MIDAZOLAM HCL (PF) 2 MG/2ML IJ SOLN
INTRAMUSCULAR | Status: DC | PRN
  Administered 2023-12-28: 1 mg via INTRAVENOUS

## 2023-12-28 MED ORDER — SODIUM CHLORIDE 0.9 % IV SOLN: INTRAVENOUS | Status: DC

## 2023-12-28 MED ORDER — MIDAZOLAM HCL 2 MG/ML PO SYRP: 8.0000 mg | ORAL_SOLUTION | Freq: Once | ORAL | Status: DC | PRN

## 2023-12-28 MED ORDER — CEFAZOLIN SODIUM-DEXTROSE 1-4 GM/50ML-% IV SOLN
1.0000 g | INTRAVENOUS | Status: AC
  Administered 2023-12-28: 1 g via INTRAVENOUS

## 2023-12-28 SURGICAL SUPPLY — 13 items
CATH CANNON HEMO 15FR 19 (HEMODIALYSIS SUPPLIES) IMPLANT
CATH PALINDROME-P 23 W/VT (CATHETERS) IMPLANT
COVER PROBE ULTRASOUND 5X96 (MISCELLANEOUS) IMPLANT
DERMABOND ADVANCED .7 DNX12 (GAUZE/BANDAGES/DRESSINGS) IMPLANT
DRAPE INCISE 23X17 STRL (DRAPES) IMPLANT
GOWN STRL REUS W/ TWL LRG LVL3 (GOWN DISPOSABLE) ×1 IMPLANT
GUIDEWIRE SUPER STIFF .035X180 (WIRE) IMPLANT
NDL ENTRY 21GA 7CM ECHOTIP (NEEDLE) IMPLANT
NEEDLE ENTRY 21GA 7CM ECHOTIP (NEEDLE) ×1 IMPLANT
PACK ANGIOGRAPHY (CUSTOM PROCEDURE TRAY) ×1 IMPLANT
SET INTRO CAPELLA COAXIAL (SET/KITS/TRAYS/PACK) IMPLANT
SUT MNCRL AB 4-0 PS2 18 (SUTURE) IMPLANT
SUT SILK 0 FSL (SUTURE) IMPLANT

## 2023-12-28 NOTE — Progress Notes (Signed)
 Report called to RN on floor. Patient to dialysis from specials.

## 2023-12-28 NOTE — Op Note (Signed)
 OPERATIVE NOTE   PROCEDURE: Insertion of tunneled dialysis catheter right IJ approach with ultrasound and fluoroscopic guidance.  PRE-OPERATIVE DIAGNOSIS: Acute on chronic renal insufficiency  POST-OPERATIVE DIAGNOSIS: Same  SURGEON: Cordella Shawl.  ANESTHESIA: Conscious sedation was administered under my direct supervision by the interventional radiology RN. IV Versed  plus fentanyl  were utilized. Continuous ECG, pulse oximetry and blood pressure was monitored throughout the entire procedure. Conscious sedation was for a total of 34 minutes.  ESTIMATED BLOOD LOSS: Minimal cc  CONTRAST USED:  None  FLUOROSCOPY TIME: 2.6 minutes  INDICATIONS:   Hector Vantine Watlingtonis a 58 y.o. y.o. male who presents with acute on chronic renal insufficiency.  He has progressed to the point that dialysis is indicated and therefore access is required.  Tunneled catheter is planned.  Risks and benefits were reviewed all questions answered patient agrees to proceed.  DESCRIPTION: After obtaining full informed written consent, the patient was positioned supine. The right neck and chest wall was prepped and draped in a sterile fashion. Ultrasound was placed in a sterile sleeve. Ultrasound was utilized to identify the right internal jugular vein which is noted to be echolucent and compressible indicating patency. Image is recorded for the permanent record. Under direct ultrasound visualization a micro-needle is inserted into the vein followed by the micro-wire. Micro-sheath was then advanced and a J wire is inserted without difficulty under fluoroscopic guidance. Small counterincision was made at the wire insertion site. Dilators are passed over the wire and the tunneled dialysis catheter is fed into the central venous system without difficulty.  Under fluoroscopy the catheter tip positioned at the atrial caval junction. The catheter is then approximated to the right chest wall and an exit site selected. 1%  lidocaine  is infiltrated in soft tissues at this level small incision is made and the tunneling device is then passed from the exit site to the right neck counterincision. Catheter is then connected to the tunneling device and the catheter was pulled subcutaneously. It is then transected and the hub assembly connected without difficulty.  On fluoroscopy the catheter does show a bit of a kink but also it is too short.  Given this fact an Amplatz Super Stiff wire is advanced through the catheter and directed into the inferior vena cava.  The catheter is then removed and a 23 cm tip to cuff Yuna body catheter is opened onto the field.  The dilator and peel-away sheath are then advanced over the Amplatz wire.  The catheter is then advanced over the wire through the peel-away sheath and the peel-away sheath is removed.  The final position is then adjusted so that the tips of the catheter are at the atrial caval junction.  Both lumens aspirate and flush easily. After verification of smooth contour with proper tip position under fluoroscopy the catheter is packed with 5000 units of heparin  per lumen.  Catheter secured to the skin of the right chest wall with 0 silk. A sterile dressing is applied with a Biopatch.  COMPLICATIONS: None  CONDITION: Good  Cordella Shawl [place] [facility]. Office:  [phone]   12/28/2023,12:01 PM

## 2023-12-28 NOTE — Progress Notes (Signed)
 Progress Note   Patient: Hector [name] FMW:[id number] DOB: [birth year] DOA: 12/23/2023     4 DOS: the patient was seen and examined on 12/28/2023   Brief hospital course: Hector [name] is a 58 y.o. 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  Patient also had 50 pounds of weight gain over the past few months. Patient was given IV Lasix , consulted nephrology. Changed to Lasix  infusion 11/1. Patient had a worsening renal function after Lasix , permacath was placed 11/4, hemodialysis started.   Principal Problem:   Acute on chronic systolic CHF (congestive heart failure) (HCC) Active Problems:   Acute on chronic combined systolic and diastolic CHF (congestive heart failure) (HCC)   Acute lower UTI   Paroxysmal atrial fibrillation (HCC)   Hypothyroidism   CKD (chronic kidney disease) stage 4, GFR 15-29 ml/min (HCC)   Gout   Dyslipidemia   Type 2 diabetes mellitus with chronic kidney disease, without long-term current use of insulin  (HCC)   Pancreatic insufficiency   Encounter for dialysis and dialysis catheter care   Assessment and Plan: Acute on chronic combined systolic and diastolic CHF (congestive heart failure) (HCC) Acute hypoxemic respiratory failure secondary to CHF exacerbation. Patient has increased short of breath, paroxysmal nocturnal dyspnea, orthopnea, significant weight gain over 15 pounds.  Elevated BNP, chest x-ray showed pulmonary edema. Frequent exacerbation congestive heart failure was due to chronic kidney disease. Very limited option for GDMT due to advanced renal disease.  Will start  beta-blocker once acute phase of congestive heart is better. Patient was given IV Lasix , renal function is slightly worsened today.  Started on Lasix  infusion per nephrology 11/1.  Patient still has significant volume overload, urine output is slowing down, renal function still stable, Lasix  dose was increased to 10 mg/h 11/3.  Renal function worsened, started hemodialysis.  Acute renal failure on on chronic kidney disease stage IV. Renal function still stable after diuretics, but the patient is approaching end-stage of kidney disease.   Renal function worsened after IV Lasix , started hemodialysis.  PermCath placed.   Acute lower UTI, suspected, ruled out. Urine culture pending, recent urine culture on 12/16/2023 had insignificant growth.  Repeat urine culture still has insignificant growth.  Patient has colonization without UTI.  Discontinued antibiotics   Paroxysmal atrial fibrillation (HCC) Continue Eliquis    Hypothyroidism Synthroid    Gout - Will continue allopurinol .   Pancreatic insufficiency Continue Creon , no additional diarrhea.   Type 2 diabetes mellitus with chronic kidney disease, without long-term current use of insulin  (HCC) Continue sliding scale insulin , discontinue oral diabetic medicine. Patient has been refusing glucose checks and sliding scale insulin .   Dyslipidemia - Will continue statin therapy.   Class III obesity with BMI at 41.39. Diet and exercise.          Subjective:  Patient still has some short of breath, orthopnea.  Occasional paroxysmal nocturnal dyspnea.  Leg edema.  Physical Exam: Vitals:   12/28/23 1307 12/28/23 1330 12/28/23 1400 12/28/23 1430  BP: (!) 112/96 (!) 122/95 (!) 120/93 (!) 115/93  Pulse: 83 75  78  Resp: 14 15 15 14   Temp:      TempSrc:      SpO2: 100% 100%  98% 100%  Weight:      Height:       General exam: Appears calm and comfortable  Respiratory system: Crackles in the base. Respiratory effort  normal. Cardiovascular system: S1 & S2 heard, RRR. No JVD, murmurs, rubs, gallops or clicks.  Gastrointestinal system: Abdomen is nondistended, soft and nontender. No organomegaly or masses felt. Normal bowel sounds heard. Central nervous system: Alert and oriented. No focal neurological deficits. Extremities: 2+ leg edema. Skin: No rashes, lesions or ulcers Psychiatry: Judgement and insight appear normal. Mood & affect appropriate.    Data Reviewed:  Lab results reviewed.  Family Communication: None  Disposition: Status is: Inpatient Remains inpatient appropriate because: Severity of disease, inpatient procedure.     Time spent: 35 minutes  Author: Murvin Mana, MD 12/28/2023 2:57 PM  For on call review [email or web address].

## 2023-12-28 NOTE — H&P (View-Only) (Signed)
 Hospital Consult    Reason for Consult:  Placement of Dialysis [facility] Catheter Requesting Physician:  Faith Harris NP  MRN #:  [id number]  History of Present Illness: This is a 58 y.o. male male with heart failure with reduced ejection fraction with EF less than 20%, nonischemic cardiomyopathy, history of alcohol use, paroxysmal atrial fibrillation/flutter on Eliquis , obstructive sleep apnea on CPAP, chronic kidney disease stage IV, CVA, hypertension, hyperlipidemia, type 2 diabetes mellitus who presented with worsening shortness of breath, orthopnea, LEE, and 50 lb weight gain over several months. On admission, BNP was 552.3, HS-troponin was not measured, and lipase 23. Chest x-ray noted vascular congestion and pulmonary edema.   He is also noted to have a BUN of 53 and a creatinine of 4.61.  His GFR was noted to be 14.  Will continue since admission his kidney status has worsened.  His BUN is now 65 and his creatinine is 5.07 with a GFR of 12.  Vascular surgery was consulted for placement of dialysis permacatheter for long-term outpatient hemodialysis.  Past Medical History:  Diagnosis Date   Alcohol abuse    Alcoholic cardiomyopathy (HCC)    a. 12/2007 MV: EF 28%, no isch;  b. 8/12 Echo: EF 25-35%; c. 02/2014 Echo: EF 20-25%; d. 12/2014 Cath: minimal CAD; e. 01/2015 Echo: EF 50-55%;  d. 05/2016 Echo: EF 30-35%, diff HK, gr2 DD; e. 11/2016 Echo: EF 45-50%, diff HK; f. 09/2021 Echo: EF <20%; g. 11/2021 Echo: EF<20%.   Chronic combined systolic (congestive) and diastolic (congestive) heart failure (HCC)    a. 05/2016 Echo: EF 30-35%; b. 11/2016 Echo: EF 45-50%, diff HK; c. 09/2021 Echo: EF < 20%; d. 11/2021 Echo: EF <20%, no rwma, Nl RV fxn, sev dil LA, mod-sev MR. mod dil PA w/ mildly elev PASP.   CKD (chronic kidney disease), stage III (HCC)    Elevated troponin (chronic)    Essential hypertension    GI bleed 11/2013   Hyperlipidemia    Pancreatitis    Paroxysmal A-fib (HCC)    a. new onset  s/p unsuccessful TEE/DCCV on 08/16/2014; b. CHA2DS2VASc = 3-> eliquis  (freq noncompliant); c. 02/2021 s/p TEE/DCCV; d. Prev on amio->d/c 2/2 abnl TFTs.   Paroxysmal atrial flutter (HCC)    Sleep-disordered breathing    Has yet to have a sleep study   Stroke [facility])     Past Surgical History:  Procedure Laterality Date   CARDIAC CATHETERIZATION N/A 01/09/2015   Procedure: Left Heart Cath and Coronary Angiography;  Surgeon: Alm LELON Clay, MD;  Location: [facility] INVASIVE CV LAB;  Service: Cardiovascular;  Laterality: N/A;   CARDIOVERSION N/A 03/05/2021   Procedure: CARDIOVERSION;  Surgeon: Darliss Rogue, MD;  Location: [facility] ORS;  Service: Cardiovascular;  Laterality: N/A;   CARDIOVERSION N/A 01/19/2023   Procedure: CARDIOVERSION;  Surgeon: Rolan Ezra RAMAN, MD;  Location: [facility] ORS;  Service: Cardiovascular;  Laterality: N/A;   CARDIOVERSION N/A 07/30/2023   Procedure: CARDIOVERSION;  Surgeon: Cherrie Toribio SAUNDERS, MD;  Location: [facility] ORS;  Service: Cardiovascular;  Laterality: N/A;   CENTRAL LINE INSERTION  04/28/2022   Procedure: CENTRAL LINE INSERTION;  Surgeon: Mady Bruckner, MD;  Location: [facility] INVASIVE CV LAB;  Service: Cardiovascular;;   COLONOSCOPY N/A 07/22/2020   Procedure: COLONOSCOPY;  Surgeon: Toledo, Ladell POUR, MD;  Location: [facility] ENDOSCOPY;  Service: Gastroenterology;  Laterality: N/A;   ELECTROPHYSIOLOGIC STUDY N/A 08/16/2014   Procedure: CARDIOVERSION;  Surgeon: Evalene JINNY Lunger, MD;  Location: [facility] ORS;  Service: Cardiovascular;  Laterality: N/A;  ESOPHAGOGASTRODUODENOSCOPY (EGD) WITH PROPOFOL  N/A 05/04/2022   Procedure: ESOPHAGOGASTRODUODENOSCOPY (EGD) WITH PROPOFOL ;  Surgeon: Unk Corinn Skiff, MD;  Location: [facility] ENDOSCOPY;  Service: Gastroenterology;  Laterality: N/A;   FLEXIBLE SIGMOIDOSCOPY N/A 10/10/2015   Procedure: FLEXIBLE SIGMOIDOSCOPY;  Surgeon: Gladis RAYMOND Mariner, MD;  Location: [facility] ENDOSCOPY;  Service: Endoscopy;  Laterality: N/A;   KNEE SURGERY Right    NM MYOVIEW   LTD  November 2011   No ischemia or infarction. EF 50-55% ( no improvement from 2009 Myoview  EF of 28%   RIGHT HEART CATH N/A 04/28/2022   Procedure: RIGHT HEART CATH;  Surgeon: Mady Bruckner, MD;  Location: [facility] INVASIVE CV LAB;  Service: Cardiovascular;  Laterality: N/A;   RIGHT HEART CATH N/A 06/23/2023   Procedure: RIGHT HEART CATH;  Surgeon: Rolan Ezra RAMAN, MD;  Location: [facility] INVASIVE CV LAB;  Service: Cardiovascular;  Laterality: N/A;   TEE WITHOUT CARDIOVERSION N/A 08/16/2014   Procedure: TRANSESOPHAGEAL ECHOCARDIOGRAM (TEE);  Surgeon: Evalene JINNY Lunger, MD;  Location: [facility] ORS;  Service: Cardiovascular;  Laterality: N/A;   TEE WITHOUT CARDIOVERSION N/A 03/05/2021   Procedure: TRANSESOPHAGEAL ECHOCARDIOGRAM (TEE);  Surgeon: Darliss Rogue, MD;  Location: [facility] ORS;  Service: Cardiovascular;  Laterality: N/A;   TEE WITHOUT CARDIOVERSION N/A 05/01/2022   Procedure: TRANSESOPHAGEAL ECHOCARDIOGRAM;  Surgeon: Rolan Ezra RAMAN, MD;  Location: [facility] ORS;  Service: Cardiovascular;  Laterality: N/A;  TEE with cardioversion   TEE WITHOUT CARDIOVERSION N/A 01/19/2023   Procedure: TRANSESOPHAGEAL ECHOCARDIOGRAM (TEE);  Surgeon: Rolan Ezra RAMAN, MD;  Location: [facility] ORS;  Service: Cardiovascular;  Laterality: N/A;   TEE WITHOUT CARDIOVERSION N/A 07/30/2023   Procedure: ECHOCARDIOGRAM, TRANSESOPHAGEAL;  Surgeon: Cherrie Toribio SAUNDERS, MD;  Location: [facility] ORS;  Service: Cardiovascular;  Laterality: N/A;    Allergies  Allergen Reactions   Esomeprazole Magnesium  Other (See Comments)    Suspected interstitial nephritis 2018   Egg Protein-Containing Drug Products Rash    Prior to Admission medications   Medication Sig Start Date End Date Taking? Authorizing Provider  allopurinol  (ZYLOPRIM ) 100 MG tablet Take 100 mg by mouth daily. 05/03/23  Yes [provider]  amiodarone  (PACERONE ) 200 MG tablet Take 1 tablet (200 mg total) by mouth daily. 09/02/23  Yes [place] A, FNP  apixaban  (ELIQUIS ) 5  MG TABS tablet Take 2 tabs twice a day until 6/14, then take 1 tab twice a day Patient taking differently: Take 5 mg by mouth 2 (two) times daily. 09/02/23  Yes Hackney, City A, FNP  atorvastatin  (LIPITOR ) 80 MG tablet Take 1 tablet (80 mg total) by mouth daily. 09/02/23  Yes Hackney, [place] A, FNP  calcitRIOL (ROCALTROL) 0.25 MCG capsule Take 0.25 mcg by mouth daily.   Yes [provider]  glipiZIDE (GLUCOTROL XL) 5 MG 24 hr tablet Take 5 mg by mouth daily. 11/04/23  Yes [provider]  levothyroxine  (SYNTHROID ) 125 MCG tablet Take 125 mcg by mouth daily before breakfast.   Yes [provider]  lidocaine  (LIDODERM ) 5 % Place 1 patch onto the skin every 12 (twelve) hours. Remove & Discard patch within 12 hours or as directed by MD 12/10/23 12/09/24 Yes Claudene Rover, MD  methocarbamol  (ROBAXIN ) 500 MG tablet Take 1 tablet (500 mg total) by mouth every 8 (eight) hours as needed. 12/10/23  Yes Claudene Rover, MD  polyethylene glycol (MIRALAX  / GLYCOLAX ) 17 g packet Take 17 g by mouth daily as needed for mild constipation. 01/19/23  Yes Wieting, Richard, MD  lipase/protease/amylase (CREON ) 36000 UNITS CPEP capsule Take 1 capsule (36,000  Units total) by mouth 3 (three) times daily before meals. Patient not taking: No sig reported 01/19/23   Josette Ade, MD    Social History   Socioeconomic History   Marital status: Single    Spouse name: Not on file   Number of children: Not on file   Years of education: Not on file   Highest education level: Not on file  Occupational History   Not on file  Tobacco Use   Smoking status: Former    Current packs/day: 0.00    Average packs/day: 1 pack/day for 12.0 years (12.0 ttl pk-yrs)    Types: Cigarettes    Start date: 02/23/1986    Quit date: 02/23/1998    Years since quitting: 25.8   Smokeless tobacco: Never  Vaping Use   Vaping status: Never Used  Substance and Sexual Activity   Alcohol use: Not Currently    Alcohol/week: 0.0  standard drinks of alcohol    Comment: Past heavy drinker   Drug use: No   Sexual activity: Not Currently  Other Topics Concern   Not on file  Social History Narrative   Not on file   Social Drivers of Health   Financial Resource Strain: Low Risk  (01/19/2023)   Overall Financial Resource Strain (CARDIA)    Difficulty of Paying Living Expenses: Not hard at all  Food Insecurity: No Food Insecurity (12/16/2023)   Hunger Vital Sign    Worried About Running Out of Food in the Last Year: Never true    Ran Out of Food in the Last Year: Never true  Transportation Needs: No Transportation Needs (12/16/2023)   PRAPARE - Administrator, Civil Service (Medical): No    Lack of Transportation (Non-Medical): No  Physical Activity: Inactive (01/04/2019)   Exercise Vital Sign    Days of Exercise per Week: 0 days    Minutes of Exercise per Session: 0 min  Stress: No Stress Concern Present (01/04/2019)   [employer] of Occupational Health - Occupational Stress Questionnaire    Feeling of Stress : Not at all  Social Connections: Unknown (12/16/2023)   Social Connection and Isolation Panel    Frequency of Communication with Friends and Family: More than three times a week    Frequency of Social Gatherings with Friends and Family: Once a week    Attends Religious Services: Never    Database Administrator or Organizations: No    Attends Banker Meetings: Patient declined    Marital Status: Patient declined  Catering Manager Violence: Not At Risk (12/16/2023)   Humiliation, Afraid, Rape, and Kick questionnaire    Fear of Current or Ex-Partner: No    Emotionally Abused: No    Physically Abused: No    Sexually Abused: No     Family History  Problem Relation Age of Onset   Hypertension Mother    Hyperlipidemia Mother    Diabetes Mother     ROS: Otherwise negative unless mentioned in HPI  Physical Examination  Vitals:   12/28/23 0434 12/28/23 0814  BP:  (!) 116/91 120/86  Pulse:  93  Resp: 20 18  Temp: 97.6 F (36.4 C) 97.9 F (36.6 C)  SpO2: 99% 95%   Body mass index is 36.01 kg/m.  General:  WDWN in NAD Gait: Not observed HENT: WNL, normocephalic Pulmonary: normal non-labored breathing, without Rales, rhonchi,  wheezing Cardiac: irregular, without  Murmurs, rubs or gallops; without carotid bruits Abdomen: Positive bowel sounds throughout, soft, NT/ND,  no masses Skin: without rashes Vascular Exam/Pulses: Palpable pulses throughout, all extremities are warm to the touch.  Extremities: without ischemic changes, without Gangrene , without cellulitis; without open wounds;  Musculoskeletal: no muscle wasting or atrophy  Neurologic: A&O X 3;  No focal weakness or paresthesias are detected; speech is fluent/normal Psychiatric:  The pt has Normal affect. Lymph:  Unremarkable  CBC    Component Value Date/Time   WBC 8.3 12/24/2023 0432   RBC 4.89 12/24/2023 0432   HGB 13.8 12/24/2023 0432   HGB 11.2 (L) 10/08/2020 1451   HCT 43.4 12/24/2023 0432   HCT 37.9 10/08/2020 1451   PLT 191 12/24/2023 0432   PLT 351 10/08/2020 1451   MCV 88.8 12/24/2023 0432   MCV 77 (L) 10/08/2020 1451   MCV 76 (L) 03/08/2014 0410   MCH 28.2 12/24/2023 0432   MCHC 31.8 12/24/2023 0432   RDW 18.2 (H) 12/24/2023 0432   RDW 18.5 (H) 10/08/2020 1451   RDW 21.6 (H) 03/08/2014 0410   LYMPHSABS 1.4 12/19/2023 0412   LYMPHSABS 1.6 06/02/2017 1109   LYMPHSABS 1.1 03/08/2014 0410   MONOABS 1.0 12/19/2023 0412   MONOABS 0.5 03/08/2014 0410   EOSABS 0.2 12/19/2023 0412   EOSABS 0.2 06/02/2017 1109   EOSABS 0.1 03/08/2014 0410   BASOSABS 0.1 12/19/2023 0412   BASOSABS 0.1 06/02/2017 1109   BASOSABS 0.1 03/08/2014 0410    BMET    Component Value Date/Time   NA 134 (L) 12/28/2023 0428   NA 140 07/13/2023 1549   NA 135 (L) 03/08/2014 0410   K 3.8 12/28/2023 0428   K 3.9 03/08/2014 0410   CL 96 (L) 12/28/2023 0428   CL 101 03/08/2014 0410   CO2 26  12/28/2023 0428   CO2 27 03/08/2014 0410   GLUCOSE 100 (H) 12/28/2023 0428   GLUCOSE 106 (H) 03/08/2014 0410   BUN 65 (H) 12/28/2023 0428   BUN 39 (H) 07/13/2023 1549   BUN 26 (H) 03/08/2014 0410   CREATININE 5.07 (H) 12/28/2023 0428   CREATININE 2.38 (H) 03/08/2014 0410   CALCIUM  8.9 12/28/2023 0428   CALCIUM  7.9 (L) 03/08/2014 0410   GFRNONAA 12 (L) 12/28/2023 0428   GFRNONAA 31 (L) 03/08/2014 0410   GFRNONAA 42 (L) 08/27/2013 0525   GFRAA 36 (L) 10/11/2018 1226   GFRAA 38 (L) 03/08/2014 0410   GFRAA 49 (L) 08/27/2013 0525    COAGS: Lab Results  Component Value Date   INR 1.4 (H) 07/28/2023   INR 1.2 05/01/2022   INR 2.0 (H) 04/26/2022     Non-Invasive Vascular Imaging:   NONE  Statin:  Yes.   Beta Blocker:  No. Aspirin :  No. ACEI:  No. ARB:  No. CCB use:  No Other antiplatelets/anticoagulants:  Yes.   Eliquis  5 mg BID    ASSESSMENT/PLAN: This is a 58 y.o. male with heart failure and EF of less than 20% with nonischemic cardiomyopathy and alcohol abuse, history of of atrial fibrillation fibrillation/flutter on Eliquis , and known chronic kidney disease stage IV.  He presents to [facility] emergency department 4 days ago with worsening shortness of breath, orthopnea, 50 pound weight gain.  And on admission his BNP was elevated by 52.3 and his chest x-ray noted vascular congestion with pulmonary edema.  Vascular surgery was consulted for placement of dialysis permacatheter for outpatient dialysis.  I had a long detailed discussion with the patient this morning at the bedside concerning the procedure, benefits, risk, complications.  Patient verbalized understanding and  wishes to proceed.  Patient has been n.p.o. since midnight last night.  Patient's current BUN is 65 and creatinine is 5.07 with a GFR of 12.  Patient last given Eliquis  5 mg at 10 PM last night.  Morning dose was held.   -I discussed the case in detail with Dr. Cordella Shawl MD and he agrees with  plan.   Gwendlyn JONELLE Shank [facility] 12/28/2023 9:02 AM

## 2023-12-28 NOTE — Interval H&P Note (Signed)
 History and Physical Interval Note:  12/28/2023 10:53 AM  Hector [name]  has presented today for surgery, with the diagnosis of ESRD.  The various methods of treatment have been discussed with the patient and family. After consideration of risks, benefits and other options for treatment, the patient has consented to  Procedure(s): DIALYSIS/PERMA CATHETER INSERTION (N/A) as a surgical intervention.  The patient's history has been reviewed, patient examined, no change in status, stable for surgery.  I have reviewed the patient's chart and labs.  Questions were answered to the patient's satisfaction.     Cordella Shawl

## 2023-12-28 NOTE — Plan of Care (Signed)

## 2023-12-28 NOTE — Consult Note (Signed)
 Hospital Consult    Reason for Consult:  Placement of Dialysis [facility] Catheter Requesting Physician:  Faith Harris NP  MRN #:  [id number]  History of Present Illness: This is a 58 y.o. male male with heart failure with reduced ejection fraction with EF less than 20%, nonischemic cardiomyopathy, history of alcohol use, paroxysmal atrial fibrillation/flutter on Eliquis , obstructive sleep apnea on CPAP, chronic kidney disease stage IV, CVA, hypertension, hyperlipidemia, type 2 diabetes mellitus who presented with worsening shortness of breath, orthopnea, LEE, and 50 lb weight gain over several months. On admission, BNP was 552.3, HS-troponin was not measured, and lipase 23. Chest x-ray noted vascular congestion and pulmonary edema.   He is also noted to have a BUN of 53 and a creatinine of 4.61.  His GFR was noted to be 14.  Will continue since admission his kidney status has worsened.  His BUN is now 65 and his creatinine is 5.07 with a GFR of 12.  Vascular surgery was consulted for placement of dialysis permacatheter for long-term outpatient hemodialysis.  Past Medical History:  Diagnosis Date   Alcohol abuse    Alcoholic cardiomyopathy (HCC)    a. 12/2007 MV: EF 28%, no isch;  b. 8/12 Echo: EF 25-35%; c. 02/2014 Echo: EF 20-25%; d. 12/2014 Cath: minimal CAD; e. 01/2015 Echo: EF 50-55%;  d. 05/2016 Echo: EF 30-35%, diff HK, gr2 DD; e. 11/2016 Echo: EF 45-50%, diff HK; f. 09/2021 Echo: EF <20%; g. 11/2021 Echo: EF<20%.   Chronic combined systolic (congestive) and diastolic (congestive) heart failure (HCC)    a. 05/2016 Echo: EF 30-35%; b. 11/2016 Echo: EF 45-50%, diff HK; c. 09/2021 Echo: EF < 20%; d. 11/2021 Echo: EF <20%, no rwma, Nl RV fxn, sev dil LA, mod-sev MR. mod dil PA w/ mildly elev PASP.   CKD (chronic kidney disease), stage III (HCC)    Elevated troponin (chronic)    Essential hypertension    GI bleed 11/2013   Hyperlipidemia    Pancreatitis    Paroxysmal A-fib (HCC)    a. new onset  s/p unsuccessful TEE/DCCV on 08/16/2014; b. CHA2DS2VASc = 3-> eliquis  (freq noncompliant); c. 02/2021 s/p TEE/DCCV; d. Prev on amio->d/c 2/2 abnl TFTs.   Paroxysmal atrial flutter (HCC)    Sleep-disordered breathing    Has yet to have a sleep study   Stroke [facility])     Past Surgical History:  Procedure Laterality Date   CARDIAC CATHETERIZATION N/A 01/09/2015   Procedure: Left Heart Cath and Coronary Angiography;  Surgeon: Alm LELON Clay, MD;  Location: [facility] INVASIVE CV LAB;  Service: Cardiovascular;  Laterality: N/A;   CARDIOVERSION N/A 03/05/2021   Procedure: CARDIOVERSION;  Surgeon: Darliss Rogue, MD;  Location: [facility] ORS;  Service: Cardiovascular;  Laterality: N/A;   CARDIOVERSION N/A 01/19/2023   Procedure: CARDIOVERSION;  Surgeon: Rolan Ezra RAMAN, MD;  Location: [facility] ORS;  Service: Cardiovascular;  Laterality: N/A;   CARDIOVERSION N/A 07/30/2023   Procedure: CARDIOVERSION;  Surgeon: Cherrie Toribio SAUNDERS, MD;  Location: [facility] ORS;  Service: Cardiovascular;  Laterality: N/A;   CENTRAL LINE INSERTION  04/28/2022   Procedure: CENTRAL LINE INSERTION;  Surgeon: Mady Bruckner, MD;  Location: [facility] INVASIVE CV LAB;  Service: Cardiovascular;;   COLONOSCOPY N/A 07/22/2020   Procedure: COLONOSCOPY;  Surgeon: Toledo, Ladell POUR, MD;  Location: [facility] ENDOSCOPY;  Service: Gastroenterology;  Laterality: N/A;   ELECTROPHYSIOLOGIC STUDY N/A 08/16/2014   Procedure: CARDIOVERSION;  Surgeon: Evalene JINNY Lunger, MD;  Location: [facility] ORS;  Service: Cardiovascular;  Laterality: N/A;  ESOPHAGOGASTRODUODENOSCOPY (EGD) WITH PROPOFOL  N/A 05/04/2022   Procedure: ESOPHAGOGASTRODUODENOSCOPY (EGD) WITH PROPOFOL ;  Surgeon: Unk Corinn Skiff, MD;  Location: [facility] ENDOSCOPY;  Service: Gastroenterology;  Laterality: N/A;   FLEXIBLE SIGMOIDOSCOPY N/A 10/10/2015   Procedure: FLEXIBLE SIGMOIDOSCOPY;  Surgeon: Gladis RAYMOND Mariner, MD;  Location: [facility] ENDOSCOPY;  Service: Endoscopy;  Laterality: N/A;   KNEE SURGERY Right    NM MYOVIEW   LTD  November 2011   No ischemia or infarction. EF 50-55% ( no improvement from 2009 Myoview  EF of 28%   RIGHT HEART CATH N/A 04/28/2022   Procedure: RIGHT HEART CATH;  Surgeon: Mady Bruckner, MD;  Location: [facility] INVASIVE CV LAB;  Service: Cardiovascular;  Laterality: N/A;   RIGHT HEART CATH N/A 06/23/2023   Procedure: RIGHT HEART CATH;  Surgeon: Rolan Ezra RAMAN, MD;  Location: [facility] INVASIVE CV LAB;  Service: Cardiovascular;  Laterality: N/A;   TEE WITHOUT CARDIOVERSION N/A 08/16/2014   Procedure: TRANSESOPHAGEAL ECHOCARDIOGRAM (TEE);  Surgeon: Evalene JINNY Lunger, MD;  Location: [facility] ORS;  Service: Cardiovascular;  Laterality: N/A;   TEE WITHOUT CARDIOVERSION N/A 03/05/2021   Procedure: TRANSESOPHAGEAL ECHOCARDIOGRAM (TEE);  Surgeon: Darliss Rogue, MD;  Location: [facility] ORS;  Service: Cardiovascular;  Laterality: N/A;   TEE WITHOUT CARDIOVERSION N/A 05/01/2022   Procedure: TRANSESOPHAGEAL ECHOCARDIOGRAM;  Surgeon: Rolan Ezra RAMAN, MD;  Location: [facility] ORS;  Service: Cardiovascular;  Laterality: N/A;  TEE with cardioversion   TEE WITHOUT CARDIOVERSION N/A 01/19/2023   Procedure: TRANSESOPHAGEAL ECHOCARDIOGRAM (TEE);  Surgeon: Rolan Ezra RAMAN, MD;  Location: [facility] ORS;  Service: Cardiovascular;  Laterality: N/A;   TEE WITHOUT CARDIOVERSION N/A 07/30/2023   Procedure: ECHOCARDIOGRAM, TRANSESOPHAGEAL;  Surgeon: Cherrie Toribio SAUNDERS, MD;  Location: [facility] ORS;  Service: Cardiovascular;  Laterality: N/A;    Allergies  Allergen Reactions   Esomeprazole Magnesium  Other (See Comments)    Suspected interstitial nephritis 2018   Egg Protein-Containing Drug Products Rash    Prior to Admission medications   Medication Sig Start Date End Date Taking? Authorizing Provider  allopurinol  (ZYLOPRIM ) 100 MG tablet Take 100 mg by mouth daily. 05/03/23  Yes [provider]  amiodarone  (PACERONE ) 200 MG tablet Take 1 tablet (200 mg total) by mouth daily. 09/02/23  Yes Donette [place] A, FNP  apixaban  (ELIQUIS ) 5  MG TABS tablet Take 2 tabs twice a day until 6/14, then take 1 tab twice a day Patient taking differently: Take 5 mg by mouth 2 (two) times daily. 09/02/23  Yes Hackney, City A, FNP  atorvastatin  (LIPITOR ) 80 MG tablet Take 1 tablet (80 mg total) by mouth daily. 09/02/23  Yes Hackney, City A, FNP  calcitRIOL (ROCALTROL) 0.25 MCG capsule Take 0.25 mcg by mouth daily.   Yes [provider]  glipiZIDE (GLUCOTROL XL) 5 MG 24 hr tablet Take 5 mg by mouth daily. 11/04/23  Yes [provider]  levothyroxine  (SYNTHROID ) 125 MCG tablet Take 125 mcg by mouth daily before breakfast.   Yes [provider]  lidocaine  (LIDODERM ) 5 % Place 1 patch onto the skin every 12 (twelve) hours. Remove & Discard patch within 12 hours or as directed by MD 12/10/23 12/09/24 Yes Claudene Rover, MD  methocarbamol  (ROBAXIN ) 500 MG tablet Take 1 tablet (500 mg total) by mouth every 8 (eight) hours as needed. 12/10/23  Yes Claudene Rover, MD  polyethylene glycol (MIRALAX  / GLYCOLAX ) 17 g packet Take 17 g by mouth daily as needed for mild constipation. 01/19/23  Yes Wieting, Richard, MD  lipase/protease/amylase (CREON ) 36000 UNITS CPEP capsule Take 1 capsule (36,000  Units total) by mouth 3 (three) times daily before meals. Patient not taking: No sig reported 01/19/23   Josette Ade, MD    Social History   Socioeconomic History   Marital status: Single    Spouse name: Not on file   Number of children: Not on file   Years of education: Not on file   Highest education level: Not on file  Occupational History   Not on file  Tobacco Use   Smoking status: Former    Current packs/day: 0.00    Average packs/day: 1 pack/day for 12.0 years (12.0 ttl pk-yrs)    Types: Cigarettes    Start date: 02/23/1986    Quit date: 02/23/1998    Years since quitting: 25.8   Smokeless tobacco: Never  Vaping Use   Vaping status: Never Used  Substance and Sexual Activity   Alcohol use: Not Currently    Alcohol/week: 0.0  standard drinks of alcohol    Comment: Past heavy drinker   Drug use: No   Sexual activity: Not Currently  Other Topics Concern   Not on file  Social History Narrative   Not on file   Social Drivers of Health   Financial Resource Strain: Low Risk  (01/19/2023)   Overall Financial Resource Strain (CARDIA)    Difficulty of Paying Living Expenses: Not hard at all  Food Insecurity: No Food Insecurity (12/16/2023)   Hunger Vital Sign    Worried About Running Out of Food in the Last Year: Never true    Ran Out of Food in the Last Year: Never true  Transportation Needs: No Transportation Needs (12/16/2023)   PRAPARE - Administrator, Civil Service (Medical): No    Lack of Transportation (Non-Medical): No  Physical Activity: Inactive (01/04/2019)   Exercise Vital Sign    Days of Exercise per Week: 0 days    Minutes of Exercise per Session: 0 min  Stress: No Stress Concern Present (01/04/2019)   [employer] of Occupational Health - Occupational Stress Questionnaire    Feeling of Stress : Not at all  Social Connections: Unknown (12/16/2023)   Social Connection and Isolation Panel    Frequency of Communication with Friends and Family: More than three times a week    Frequency of Social Gatherings with Friends and Family: Once a week    Attends Religious Services: Never    Database Administrator or Organizations: No    Attends Banker Meetings: Patient declined    Marital Status: Patient declined  Catering Manager Violence: Not At Risk (12/16/2023)   Humiliation, Afraid, Rape, and Kick questionnaire    Fear of Current or Ex-Partner: No    Emotionally Abused: No    Physically Abused: No    Sexually Abused: No     Family History  Problem Relation Age of Onset   Hypertension Mother    Hyperlipidemia Mother    Diabetes Mother     ROS: Otherwise negative unless mentioned in HPI  Physical Examination  Vitals:   12/28/23 0434 12/28/23 0814  BP:  (!) 116/91 120/86  Pulse:  93  Resp: 20 18  Temp: 97.6 F (36.4 C) 97.9 F (36.6 C)  SpO2: 99% 95%   Body mass index is 36.01 kg/m.  General:  WDWN in NAD Gait: Not observed HENT: WNL, normocephalic Pulmonary: normal non-labored breathing, without Rales, rhonchi,  wheezing Cardiac: irregular, without  Murmurs, rubs or gallops; without carotid bruits Abdomen: Positive bowel sounds throughout, soft, NT/ND,  no masses Skin: without rashes Vascular Exam/Pulses: Palpable pulses throughout, all extremities are warm to the touch.  Extremities: without ischemic changes, without Gangrene , without cellulitis; without open wounds;  Musculoskeletal: no muscle wasting or atrophy  Neurologic: A&O X 3;  No focal weakness or paresthesias are detected; speech is fluent/normal Psychiatric:  The pt has Normal affect. Lymph:  Unremarkable  CBC    Component Value Date/Time   WBC 8.3 12/24/2023 0432   RBC 4.89 12/24/2023 0432   HGB 13.8 12/24/2023 0432   HGB 11.2 (L) 10/08/2020 1451   HCT 43.4 12/24/2023 0432   HCT 37.9 10/08/2020 1451   PLT 191 12/24/2023 0432   PLT 351 10/08/2020 1451   MCV 88.8 12/24/2023 0432   MCV 77 (L) 10/08/2020 1451   MCV 76 (L) 03/08/2014 0410   MCH 28.2 12/24/2023 0432   MCHC 31.8 12/24/2023 0432   RDW 18.2 (H) 12/24/2023 0432   RDW 18.5 (H) 10/08/2020 1451   RDW 21.6 (H) 03/08/2014 0410   LYMPHSABS 1.4 12/19/2023 0412   LYMPHSABS 1.6 06/02/2017 1109   LYMPHSABS 1.1 03/08/2014 0410   MONOABS 1.0 12/19/2023 0412   MONOABS 0.5 03/08/2014 0410   EOSABS 0.2 12/19/2023 0412   EOSABS 0.2 06/02/2017 1109   EOSABS 0.1 03/08/2014 0410   BASOSABS 0.1 12/19/2023 0412   BASOSABS 0.1 06/02/2017 1109   BASOSABS 0.1 03/08/2014 0410    BMET    Component Value Date/Time   NA 134 (L) 12/28/2023 0428   NA 140 07/13/2023 1549   NA 135 (L) 03/08/2014 0410   K 3.8 12/28/2023 0428   K 3.9 03/08/2014 0410   CL 96 (L) 12/28/2023 0428   CL 101 03/08/2014 0410   CO2 26  12/28/2023 0428   CO2 27 03/08/2014 0410   GLUCOSE 100 (H) 12/28/2023 0428   GLUCOSE 106 (H) 03/08/2014 0410   BUN 65 (H) 12/28/2023 0428   BUN 39 (H) 07/13/2023 1549   BUN 26 (H) 03/08/2014 0410   CREATININE 5.07 (H) 12/28/2023 0428   CREATININE 2.38 (H) 03/08/2014 0410   CALCIUM  8.9 12/28/2023 0428   CALCIUM  7.9 (L) 03/08/2014 0410   GFRNONAA 12 (L) 12/28/2023 0428   GFRNONAA 31 (L) 03/08/2014 0410   GFRNONAA 42 (L) 08/27/2013 0525   GFRAA 36 (L) 10/11/2018 1226   GFRAA 38 (L) 03/08/2014 0410   GFRAA 49 (L) 08/27/2013 0525    COAGS: Lab Results  Component Value Date   INR 1.4 (H) 07/28/2023   INR 1.2 05/01/2022   INR 2.0 (H) 04/26/2022     Non-Invasive Vascular Imaging:   NONE  Statin:  Yes.   Beta Blocker:  No. Aspirin :  No. ACEI:  No. ARB:  No. CCB use:  No Other antiplatelets/anticoagulants:  Yes.   Eliquis  5 mg BID    ASSESSMENT/PLAN: This is a 58 y.o. male with heart failure and EF of less than 20% with nonischemic cardiomyopathy and alcohol abuse, history of of atrial fibrillation fibrillation/flutter on Eliquis , and known chronic kidney disease stage IV.  He presents to [facility] emergency department 4 days ago with worsening shortness of breath, orthopnea, 50 pound weight gain.  And on admission his BNP was elevated by 52.3 and his chest x-ray noted vascular congestion with pulmonary edema.  Vascular surgery was consulted for placement of dialysis permacatheter for outpatient dialysis.  I had a long detailed discussion with the patient this morning at the bedside concerning the procedure, benefits, risk, complications.  Patient verbalized understanding and  wishes to proceed.  Patient has been n.p.o. since midnight last night.  Patient's current BUN is 65 and creatinine is 5.07 with a GFR of 12.  Patient last given Eliquis  5 mg at 10 PM last night.  Morning dose was held.   -I discussed the case in detail with Dr. Cordella Shawl MD and he agrees with  plan.   Gwendlyn JONELLE Shank [facility] 12/28/2023 9:02 AM

## 2023-12-28 NOTE — Progress Notes (Signed)
 Hemodialysis Note:  Received patient in bed to unit. Alert and oriented. Informed consent singed and in chart.  Treatment initiated: 1307 Treatment completed: 1512  Access used: Right internal jugular catheter Access issues: None  Patient tolerated well. Transported back to room, alert without acute distress. Report given to patient's RN.  Total UF removed: 0 Medications given: None  Post HD weight: 132.4 Kg  [facility]

## 2023-12-28 NOTE — Progress Notes (Signed)
 Heart Failure Stewardship Pharmacy Note  PCP: [name], MD PCP-Cardiologist: Evalene Lunger, MD  HPI: Hector [name] is a 58 y.o. male with heart failure with reduced ejection fraction with EF less than 20%, nonischemic cardiomyopathy, history of alcohol use, paroxysmal atrial fibrillation/flutter on Eliquis , obstructive sleep apnea on CPAP, chronic kidney disease stage IV, CVA, hypertension, hyperlipidemia, type 2 diabetes mellitus who presented with worsening shortness of breath, orthopnea, LEE, and 50 lb weight gain over several months. On admission, BNP was 552.3, HS-troponin was not measured, and lipase 23. Chest x-ray noted vascular congestion and pulmonary edema.   Pertinent cardiac history: Echo in 02/2014 with LVEF of 20-25%. Underwent TEE DCCV in 07/2014 where LVEF was 20-25%. LHC in 12/2014 noted no coronary disease. LVEF in 01/2015 improved to 50-55%. Echo in 08/2020 with LVEF 25-30%.Underwent TEE DCCV in 02/2021 where LVEF was <20%. RHC 3/24 RA 17 PA 58/39 (43) PCWP 33 Fick 3.4/1.5 (PA sat 49%) PAPi 1.2. Echo in 12/2022 showed LVEF <20%, severely reduced RV function, mild MR. Echo 07/2023 with LVEF of 20-25%. TEE during DCCV 07/2023 with LVEF <20%.  Pertinent Lab Values: Creatinine  Date Value Ref Range Status  03/08/2014 2.38 (H) 0.60 - 1.30 mg/dL Final   Creatinine, Ser  Date Value Ref Range Status  12/28/2023 5.07 (H) 0.61 - 1.24 mg/dL Final   BUN  Date Value Ref Range Status  12/28/2023 65 (H) 6 - 20 mg/dL Final  94/79/7974 39 (H) 6 - 24 mg/dL Final  98/85/7983 26 (H) 7 - 18 mg/dL Final   Potassium  Date Value Ref Range Status  12/28/2023 3.8 3.5 - 5.1 mmol/L Final  03/08/2014 3.9 3.5 - 5.1 mmol/L Final   Sodium  Date Value Ref Range Status  12/28/2023 134 (L) 135 - 145 mmol/L Final  07/13/2023 140 134 - 144 mmol/L Final  03/08/2014 135 (L) 136 - 145 mmol/L Final   B Natriuretic Peptide  Date Value Ref Range Status  12/23/2023 552.3 (H) 0.0 -  100.0 pg/mL Final    Comment:    Performed at [facility], [address] [place], [place] [address]   Magnesium   Date Value Ref Range Status  12/28/2023 2.4 1.7 - 2.4 mg/dL Final    Comment:    Performed at [facility], [address] [place], [place] [address]   Hgb A1c MFr Bld  Date Value Ref Range Status  12/18/2023 7.4 (H) 4.8 - 5.6 % Final    Comment:    (NOTE) Diagnosis of Diabetes The following HbA1c ranges recommended by the American Diabetes Association (ADA) may be used as an aid in the diagnosis of diabetes mellitus.  Hemoglobin             Suggested A1C NGSP%              Diagnosis  <5.7                   Non Diabetic  5.7-6.4                Pre-Diabetic  >6.4                   Diabetic  <7.0                   Glycemic control for                       adults with diabetes.  TSH  Date Value Ref Range Status  07/28/2023 39.399 (H) 0.350 - 4.500 uIU/mL Final    Comment:    Performed by a 3rd Generation assay with a functional sensitivity of <=0.01 uIU/mL. Performed at [facility], [address] [place], [place] [address]   10/08/2020 15.900 (H) 0.450 - 4.500 uIU/mL Final   Vital Signs:  Temp:  [97.5 F (36.4 C)-98.2 F (36.8 C)] 97.6 F (36.4 C) (11/04 0434) Pulse Rate:  [87-114] 99 (11/03 2331) Cardiac Rhythm: Atrial fibrillation;Bundle branch block (11/03 1901) Resp:  [18-20] 20 (11/04 0434) BP: (116-136)/(91-99) 116/91 (11/04 0434) SpO2:  [91 %-99 %] 99 % (11/04 0434) Weight:  [117.1 kg (258 lb 2.5 oz)] 117.1 kg (258 lb 2.5 oz) (11/04 0518)  Intake/Output Summary (Last 24 hours) at 12/28/2023 0746 Last data filed at 12/28/2023 0300 Gross per 24 hour  Intake 764.84 ml  Output 3175 ml  Net -2410.16 ml    Current Heart Failure Medications:  Loop diuretic: furosemide  10 mg/hr infusion Beta-Blocker: none ACEI/ARB/ARNI: none MRA: none SGLT2i: none Other: none   Prior to admission Heart Failure  Medications:  Loop diuretic: torsemide  40 mg daily + weekly metolazone  2.5 mg + 40 meq Kcl. Beta-Blocker: none ACEI/ARB/ARNI: none MRA: none SGLT2i: Jardiance  10 mg daily Other: none   Assessment: 1. Acute on chronic systolic heart failure (LVEF <20%) with severely reduced RV function, due to NICM. NYHA class III-IV symptoms.  -Symptoms: Patient reports no improvement from yesterday despite urine output. Still has poor appetite, abdominal distention, lethargy and shortness of breath. -Volume: Hypervolemic on exam. Currently on furosemide  infusion at 10 mg/hr. Urine output improved after metolazone . Creatinine trending slightly up today. Can consider redosing metolazone  today. -Hemodynamics:  SBP is normal, DBP is elevated, pulse pressures are improved form previous -BB: Would not add BB given previous low cardiac output on RHC and current decompensation. -ACEI/ARB/ARNI: Not a candidate at this time given CKD IV. -MRA: Not a candidate at this time given creatinine >2.5 mg/dL. -SGLT2i: Held due to eGFR.  -Other:  Can consider BiDil  0.5 tablet TID given elevated BP  Plan: 1) Medication changes recommended at this time: -Urine output improved with metolazone , can consider another 2.5 mg today. -Consider adding back a half-tablet of BiDil  TID   2) Patient assistance: -Pending  3) Education: - Patient has been educated on current HF medications and potential additions to HF medication regimen - Patient verbalizes understanding that over the next few months, these medication doses may change and more medications may be added to optimize HF regimen - Patient has been educated on basic disease state pathophysiology and goals of therapy  Please do not hesitate to reach out with questions or concerns,  Jaun Bash, PharmD, CPP, BCPS, [facility] Heart Failure Pharmacist  Phone - [phone] 12/28/2023 7:46 AM

## 2023-12-28 NOTE — Progress Notes (Signed)
 [facility]  ROUNDING NOTE   Subjective:   Hector [name] is a 58 y.o. male with past medical conditions including hypertension, paroxysmal A-fib on Eliquis , CHF with a EF less than 20%, and chronic kidney disease stage IV. Patient presents to ED with shortness of breath and edema.  Patient currently admitted for Acute on chronic systolic CHF (congestive heart failure) (HCC) [I50.23] Acute on chronic congestive heart failure, unspecified heart failure type [facility]) [I50.9]  Patient known to our practice from previous admissions and is followed by [facility] nephrology outpatient.    Update Patient seen sitting at side of bed Remains drowsy but alert and oriented States he urinated all during the night  Urine output 3175 mL in preceding 24 hours Creatinine 5.07 IV furosemide  10mg /hr  Objective:  Vital signs in last 24 hours:  Temp:  [97 F (36.1 C)-98.2 F (36.8 C)] 97.8 F (36.6 C) (11/04 1247) Pulse Rate:  [76-103] 83 (11/04 1307) Resp:  [11-21] 14 (11/04 1307) BP: (105-163)/(86-149) 112/96 (11/04 1307) SpO2:  [74 %-100 %] 100 % (11/04 1307) Weight:  [117.1 kg-132.4 kg] 132.4 kg (11/04 1247)  Weight change: -17.3 kg Filed Weights   12/27/23 0500 12/28/23 0518 12/28/23 1247  Weight: 134.4 kg 117.1 kg 132.4 kg    Intake/Output: I/O last 3 completed shifts: In: 1007.7 [P.O.:900; I.V.:107.7] Out: 3975 [Urine:3975]   Intake/Output this shift:  Total I/O In: -  Out: 350 [Urine:350]  Physical Exam: General: NAD  Head: Normocephalic, atraumatic. Moist oral mucosal membranes  Eyes: Anicteric  Lungs:  Clear to auscultation, normal effort  Heart: Regular rate and rhythm  Abdomen:  Soft, nontender, distended  Extremities: 3+ peripheral edema.  Neurologic: Drowsy but able to follow commands  Skin: Warm,dry, no rash   Basic Metabolic Panel: Recent Labs  Lab 12/24/23 0727 12/25/23 0624 12/26/23 0500 12/27/23 0351 12/28/23 0428  NA 140 136 135 134* 134*  K  4.4 4.3 3.8 4.1 3.8  CL 101 98 99 98 96*  CO2 22 25 24 24 26   GLUCOSE 97 92 117* 110* 100*  BUN 53* 57* 61* 63* 65*  CREATININE 4.61* 4.95* 4.94* 4.91* 5.07*  CALCIUM  8.6* 8.5* 8.6* 8.8* 8.9  MG  --   --  2.4  --  2.4  PHOS  --  4.1 4.7* 4.9* 5.3*    Liver Function Tests: Recent Labs  Lab 12/23/23 1651 12/25/23 0624 12/26/23 0500 12/27/23 0351 12/28/23 0428  AST 109*  --   --   --   --   ALT 58*  --   --   --   --   ALKPHOS 75  --   --   --   --   BILITOT 1.7*  --   --   --   --   PROT 7.5  --   --   --   --   ALBUMIN  3.9 3.4* 3.4* 3.6 3.9   Recent Labs  Lab 12/23/23 1651  LIPASE 23   No results for input(s): AMMONIA in the last 168 hours.  CBC: Recent Labs  Lab 12/23/23 1651 12/24/23 0432  WBC 7.3 8.3  HGB 14.1 13.8  HCT 43.2 43.4  MCV 86.2 88.8  PLT 194 191    Cardiac Enzymes: No results for input(s): CKTOTAL, CKMB, CKMBINDEX, TROPONINI in the last 168 hours.  BNP: Invalid input(s): POCBNP  CBG: No results for input(s): GLUCAP in the last 168 hours.  Microbiology: Results for orders placed or performed during the hospital  encounter of 12/23/23  Urine Culture (for pregnant, neutropenic or urologic patients or patients with an indwelling urinary catheter)     Status: Abnormal   Collection Time: 12/23/23  7:40 PM   Specimen: Urine, Catheterized  Result Value Ref Range Status   Specimen Description   Final    URINE, CATHETERIZED Performed at [facility], [address] [place], [place] [address]    Special Requests   Final    NONE Performed at [facility], [address], [place], [place] [address]    Culture (A)  Final    <10,000 COLONIES/mL INSIGNIFICANT GROWTH Performed at [facility], [address], [place], [place] [address]    Report Status 12/25/2023 FINAL  Final    Coagulation Studies: No results for input(s): LABPROT, INR in the last 72 hours.  Urinalysis: No results for input(s):  COLORURINE, LABSPEC, PHURINE, GLUCOSEU, HGBUR, BILIRUBINUR, KETONESUR, PROTEINUR, UROBILINOGEN, NITRITE, LEUKOCYTESUR in the last 72 hours.  Invalid input(s): APPERANCEUR     Imaging: PERIPHERAL VASCULAR CATHETERIZATION Result Date: 12/28/2023 See surgical note for result.     Medications:    furosemide  (LASIX ) 200 mg in dextrose  5 % 100 mL (2 mg/mL) infusion 10 mg/hr (12/27/23 2209)    allopurinol   100 mg Oral Daily   amiodarone   200 mg Oral Daily   apixaban   5 mg Oral BID   atorvastatin   80 mg Oral Daily   Chlorhexidine  Gluconate Cloth  6 each Topical Q0600   levothyroxine   125 mcg Oral Q0600   lidocaine   1 patch Transdermal Q12H   lipase/protease/amylase  36,000 Units Oral TID AC   acetaminophen  **OR** acetaminophen , magnesium  hydroxide, methocarbamol , morphine  injection, ondansetron  **OR** ondansetron  (ZOFRAN ) IV, mouth rinse, traZODone   Assessment/ Plan:  [name] is a 58 y.o.  male with past medical conditions including hypertension, paroxysmal A-fib on Eliquis , CHF with a EF less than 20%, and chronic kidney disease stage IV.  Patient currently admitted for Acute on chronic systolic CHF (congestive heart failure) (HCC) [I50.23] Acute on chronic congestive heart failure, unspecified heart failure type (HCC) [I50.9]   Acute Kidney Injury on chronic kidney disease stage 4 with baseline creatinine 3.90 and GFR of 17 on 09/13/23. Most recent outpatient labs show creatinine 5.94 with GFR 10 on 12/07/23. Acute kidney injury likely has component of cardiorenal syndrome but also progression of kidney disease.   Chronic kidney disease is secondary to hypertension and CAD.  No recent IV contrast exposure.    Creatinine increased today, likely due to progressive kidney disease and metolazone  yesterday.  Adequate urine output achieved with metolazone , greater than 3 L.  However discussed with patient dialysis would be the optimal treatment to  maintain fluid status.  Patient agreeable to proceed with dialysis.  Vascular surgery consulted for placement of dialysis PermCath.  Will initiate dialysis following line placement.  Dialysis navigator notified and will begin outpatient clinic placement tomorrow.  Will plan for 3 consecutive days of dialysis prior to considering discharge.  Maintain IV furosemide  drip today, can stop tomorrow once we are able to remove fluid with dialysis.   Lab Results  Component Value Date   CREATININE 5.07 (H) 12/28/2023   CREATININE 4.91 (H) 12/27/2023   CREATININE 4.94 (H) 12/26/2023    Intake/Output Summary (Last 24 hours) at 12/28/2023 1320 Last data filed at 12/28/2023 0814 Gross per 24 hour  Intake 735 ml  Output 3050 ml  Net -2315 ml    2. Anemia of chronic  kidney disease Lab Results  Component Value Date   HGB 13.8 12/24/2023    Will continue to monitor blood levels and assess need for ESA.  3. Secondary Hyperparathyroidism: with outpatient labs: PTH 1207, phosphorus 5.6, calcium  9.4 on 12/07/2023.   Lab Results  Component Value Date   CALCIUM  8.9 12/28/2023   CAION 1.13 (L) 06/23/2023   CAION 1.10 (L) 06/23/2023   PHOS 5.3 (H) 12/28/2023    Calcium  and phosphorus acceptable at this time.  4. 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 Home regimen includes glipizide. Most recent hemoglobin A1c is 7.3 on 06/28/2023.   Sliding scale insulin  managed by primary team.   LOS: 4 Virginia Francisco 11/4/20251:20 PM

## 2023-12-29 DIAGNOSIS — E039 Hypothyroidism, unspecified: Secondary | ICD-10-CM | POA: Diagnosis not present

## 2023-12-29 DIAGNOSIS — I5023 Acute on chronic systolic (congestive) heart failure: Secondary | ICD-10-CM | POA: Diagnosis not present

## 2023-12-29 DIAGNOSIS — I480 Paroxysmal atrial fibrillation: Secondary | ICD-10-CM | POA: Diagnosis not present

## 2023-12-29 DIAGNOSIS — N189 Chronic kidney disease, unspecified: Secondary | ICD-10-CM

## 2023-12-29 DIAGNOSIS — H109 Unspecified conjunctivitis: Secondary | ICD-10-CM | POA: Insufficient documentation

## 2023-12-29 DIAGNOSIS — E669 Obesity, unspecified: Secondary | ICD-10-CM

## 2023-12-29 DIAGNOSIS — H10232 Serous conjunctivitis, except viral, left eye: Secondary | ICD-10-CM

## 2023-12-29 DIAGNOSIS — N179 Acute kidney failure, unspecified: Secondary | ICD-10-CM

## 2023-12-29 LAB — CBC
HCT: 37.1 % — ABNORMAL LOW (ref 39.0–52.0)
Hemoglobin: 12.2 g/dL — ABNORMAL LOW (ref 13.0–17.0)
MCH: 28.6 pg (ref 26.0–34.0)
MCHC: 32.9 g/dL (ref 30.0–36.0)
MCV: 86.9 fL (ref 80.0–100.0)
Platelets: 197 K/uL (ref 150–400)
RBC: 4.27 MIL/uL (ref 4.22–5.81)
RDW: 17.9 % — ABNORMAL HIGH (ref 11.5–15.5)
WBC: 6.6 K/uL (ref 4.0–10.5)
nRBC: 0 % (ref 0.0–0.2)

## 2023-12-29 LAB — RENAL FUNCTION PANEL
Albumin: 3.2 g/dL — ABNORMAL LOW (ref 3.5–5.0)
Anion gap: 15 (ref 5–15)
BUN: 56 mg/dL — ABNORMAL HIGH (ref 6–20)
CO2: 25 mmol/L (ref 22–32)
Calcium: 8.3 mg/dL — ABNORMAL LOW (ref 8.9–10.3)
Chloride: 94 mmol/L — ABNORMAL LOW (ref 98–111)
Creatinine, Ser: 4.85 mg/dL — ABNORMAL HIGH (ref 0.61–1.24)
GFR, Estimated: 13 mL/min — ABNORMAL LOW (ref >60)
Glucose, Bld: 88 mg/dL (ref 70–99)
Phosphorus: 5.1 mg/dL — ABNORMAL HIGH (ref 2.5–4.6)
Potassium: 3.8 mmol/L (ref 3.5–5.1)
Sodium: 134 mmol/L — ABNORMAL LOW (ref 135–145)

## 2023-12-29 LAB — HEPATITIS B CORE ANTIBODY, TOTAL: HEP B CORE AB: NEGATIVE

## 2023-12-29 MED ORDER — CIPROFLOXACIN HCL 0.3 % OP SOLN
2.0000 [drp] | OPHTHALMIC | Status: DC
  Administered 2023-12-29 – 2024-01-04 (×20): 2 [drp] via OPHTHALMIC
  Filled 2023-12-29: qty 2.5

## 2023-12-29 MED ORDER — HEPARIN SODIUM (PORCINE) 1000 UNIT/ML IJ SOLN
INTRAMUSCULAR | Status: AC
  Filled 2023-12-29: qty 4

## 2023-12-29 NOTE — Assessment & Plan Note (Addendum)
 Completed Ciloxan  eyedrop

## 2023-12-29 NOTE — Progress Notes (Signed)
 PT Cancellation Note  Patient Details Name: Hector [name] MRN: [id number] DOB: [birth year]   Cancelled Treatment:    Reason Eval/Treat Not Completed: Patient declined, no reason specified. Pt received sleeping in bed. Pt able to wake, however refusing eval d/t fatigue. Pt had dialysis today. Will re-attempt if able.  Tanji Storrs, SPT   [name] 12/29/2023, 2:39 PM

## 2023-12-29 NOTE — Progress Notes (Signed)
 Pt accepted at [facility] on TTS at 6:15am. Pt can start  11//12/2023 and will need to arrive at 5:45am for new pt paperwork.                       SABRA Suzen Satchel Dialysis Navigator [phone]

## 2023-12-29 NOTE — Progress Notes (Signed)
 Requested to see pt for outpt HD needs at d/c.  Met with pt bedside.  Introduced self and explained role. Discussed HD options. Pt prefers  [facility]. Referral submitted to  [facility] for review.  Suzen Satchel Dialysis Navigator [phone]

## 2023-12-29 NOTE — Assessment & Plan Note (Addendum)
 Current BMI 39.85 with current height and weight in computer

## 2023-12-29 NOTE — Assessment & Plan Note (Addendum)
 Lasix  drip stopped on 11/6.  Dialysis started on 11/4.  Hypotension limiting medications for heart failure.  Dialysis to help out with fluid removal.  Midodrine prior to dialysis to help out with fluid removal. Torsemide  on nondialysis days.

## 2023-12-29 NOTE — Progress Notes (Signed)
 [facility]  ROUNDING NOTE   Subjective:   Hector [name] is a 58 y.o. male with past medical conditions including hypertension, paroxysmal A-fib on Eliquis , CHF with a EF less than 20%, and chronic kidney disease stage IV. Patient presents to ED with shortness of breath and edema.  Patient currently admitted for Acute on chronic systolic CHF (congestive heart failure) (HCC) [I50.23] Acute on chronic congestive heart failure, unspecified heart failure type [facility]) [I50.9]  Patient known to our practice from previous admissions and is followed by [facility] nephrology outpatient.    Update Patient seen and evaluated during dialysis   HEMODIALYSIS FLOWSHEET:  Blood Flow Rate (mL/min): 249 mL/min Arterial Pressure (mmHg): -126.66 mmHg Venous Pressure (mmHg): 123.83 mmHg TMP (mmHg): 15.55 mmHg Ultrafiltration Rate (mL/min): 680 mL/min Dialysate Flow Rate (mL/min): 299 ml/min Dialysis Fluid Bolus: Normal Saline  Tolerating second treatment well   Urine output 1070 mL in preceding 24 hours IV furosemide  10mg /hr  Objective:  Vital signs in last 24 hours:  Temp:  [97 F (36.1 C)-98 F (36.7 C)] 98 F (36.7 C) (11/05 0329) Pulse Rate:  [31-109] 46 (11/05 0900) Resp:  [11-21] 16 (11/05 0900) BP: (101-163)/(82-149) 126/108 (11/05 0900) SpO2:  [74 %-100 %] 95 % (11/05 0900) Weight:  [130.4 kg-132.4 kg] 130.4 kg (11/05 0745)  Weight change: 15.3 kg Filed Weights   12/28/23 1512 12/29/23 0526 12/29/23 0745  Weight: 132.4 kg 132.4 kg 130.4 kg    Intake/Output: I/O last 3 completed shifts: In: 720 [P.O.:720] Out: 3170 [Urine:3170]   Intake/Output this shift:  Total I/O In: 143.8 [I.V.:143.8] Out: -   Physical Exam: General: NAD  Head: Normocephalic, atraumatic. Moist oral mucosal membranes  Eyes: Anicteric  Lungs:  Clear to auscultation, normal effort  Heart: Regular rate and rhythm  Abdomen:  Soft, nontender, distended  Extremities: 3+ peripheral edema.   Neurologic: Drowsy but able to follow commands  Skin: Warm,dry, no rash  Access Rt internal jugular permcath  Basic Metabolic Panel: Recent Labs  Lab 12/25/23 0624 12/26/23 0500 12/27/23 0351 12/28/23 0428 12/29/23 0547  NA 136 135 134* 134* 134*  K 4.3 3.8 4.1 3.8 3.8  CL 98 99 98 96* 94*  CO2 25 24 24 26 25   GLUCOSE 92 117* 110* 100* 88  BUN 57* 61* 63* 65* 56*  CREATININE 4.95* 4.94* 4.91* 5.07* 4.85*  CALCIUM  8.5* 8.6* 8.8* 8.9 8.3*  MG  --  2.4  --  2.4  --   PHOS 4.1 4.7* 4.9* 5.3* 5.1*    Liver Function Tests: Recent Labs  Lab 12/23/23 1651 12/25/23 0624 12/26/23 0500 12/27/23 0351 12/28/23 0428 12/29/23 0547  AST 109*  --   --   --   --   --   ALT 58*  --   --   --   --   --   ALKPHOS 75  --   --   --   --   --   BILITOT 1.7*  --   --   --   --   --   PROT 7.5  --   --   --   --   --   ALBUMIN  3.9 3.4* 3.4* 3.6 3.9 3.2*   Recent Labs  Lab 12/23/23 1651  LIPASE 23   No results for input(s): AMMONIA in the last 168 hours.  CBC: Recent Labs  Lab 12/23/23 1651 12/24/23 0432 12/28/23 1315 12/29/23 0820  WBC 7.3 8.3 8.3 6.6  HGB 14.1 13.8 12.6*  12.2*  HCT 43.2 43.4 38.9* 37.1*  MCV 86.2 88.8 86.3 86.9  PLT 194 191 194 197    Cardiac Enzymes: No results for input(s): CKTOTAL, CKMB, CKMBINDEX, TROPONINI in the last 168 hours.  BNP: Invalid input(s): POCBNP  CBG: No results for input(s): GLUCAP in the last 168 hours.  Microbiology: Results for orders placed or performed during the hospital encounter of 12/23/23  Urine Culture (for pregnant, neutropenic or urologic patients or patients with an indwelling urinary catheter)     Status: Abnormal   Collection Time: 12/23/23  7:40 PM   Specimen: Urine, Catheterized  Result Value Ref Range Status   Specimen Description   Final    URINE, CATHETERIZED Performed at [facility], [address], [place], [place] [address]    Special Requests   Final    NONE Performed at  [facility], [address] [place], [place] [address]    Culture (A)  Final    <10,000 COLONIES/mL INSIGNIFICANT GROWTH Performed at [facility], [address], [place], [place] [address]    Report Status 12/25/2023 FINAL  Final    Coagulation Studies: No results for input(s): LABPROT, INR in the last 72 hours.  Urinalysis: No results for input(s): COLORURINE, LABSPEC, PHURINE, GLUCOSEU, HGBUR, BILIRUBINUR, KETONESUR, PROTEINUR, UROBILINOGEN, NITRITE, LEUKOCYTESUR in the last 72 hours.  Invalid input(s): APPERANCEUR     Imaging: PERIPHERAL VASCULAR CATHETERIZATION Result Date: 12/28/2023 See surgical note for result.     Medications:    furosemide  (LASIX ) 200 mg in dextrose  5 % 100 mL (2 mg/mL) infusion 10 mg/hr (12/29/23 0725)    allopurinol   100 mg Oral Daily   amiodarone   200 mg Oral Daily   apixaban   5 mg Oral BID   atorvastatin   80 mg Oral Daily   Chlorhexidine  Gluconate Cloth  6 each Topical Q0600   levothyroxine   125 mcg Oral Q0600   lidocaine   1 patch Transdermal Q12H   lipase/protease/amylase  36,000 Units Oral TID AC   acetaminophen  **OR** acetaminophen , magnesium  hydroxide, methocarbamol , morphine  injection, ondansetron  **OR** ondansetron  (ZOFRAN ) IV, mouth rinse, traZODone   Assessment/ Plan:  Hector [name] is a 58 y.o.  male with past medical conditions including hypertension, paroxysmal A-fib on Eliquis , CHF with a EF less than 20%, and chronic kidney disease stage IV.  Patient currently admitted for Acute on chronic systolic CHF (congestive heart failure) (HCC) [I50.23] Acute on chronic congestive heart failure, unspecified heart failure type (HCC) [I50.9]   Acute Kidney Injury on chronic kidney disease stage 4 with baseline creatinine 3.90 and GFR of 17 on 09/13/23. Most recent outpatient labs show creatinine 5.94 with GFR 10 on 12/07/23. Acute kidney injury likely has component of cardiorenal  syndrome but also progression of kidney disease.   Chronic kidney disease is secondary to hypertension and CAD.  No recent IV contrast exposure.    Due to persistent kidney failure and multiple readmissions due to volume overload, patient was agreeable to proceed with renal replacement therapy. Appreciate vascular surgery placing Rt internal jugular permcath, first dialysis treatment was completed yesterday. Receiving second treatment today, UF goal 1L. Will plan for third treatment tomorrow. Dialysis navigator will seek outpatient dialysis treatment clinic. Will continue IV furosemide  drip today and stop tomorrow.    Lab Results  Component Value Date   CREATININE 4.85 (H) 12/29/2023   CREATININE 5.07 (H) 12/28/2023   CREATININE 4.91 (H) 12/27/2023    Intake/Output Summary (Last 24 hours) at 12/29/2023 0931 Last  data filed at 12/29/2023 0725 Gross per 24 hour  Intake 143.81 ml  Output 720 ml  Net -576.19 ml    2. Anemia of chronic kidney disease Lab Results  Component Value Date   HGB 12.2 (L) 12/29/2023    Hgb within optimal range  3. Secondary Hyperparathyroidism: with outpatient labs: PTH 1207, phosphorus 5.6, calcium  9.4 on 12/07/2023.   Lab Results  Component Value Date   CALCIUM  8.3 (L) 12/29/2023   CAION 1.13 (L) 06/23/2023   CAION 1.10 (L) 06/23/2023   PHOS 5.1 (H) 12/29/2023    Will continue to monitor bone minerals during this admission.   4. 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 Home regimen includes glipizide. Most recent hemoglobin A1c is 7.3 on 06/28/2023.   Diet controlled   LOS: 5 Jacarra Bobak 11/5/20259:31 AM

## 2023-12-29 NOTE — Progress Notes (Signed)
 Progress Note   Patient: Hector [name] FMW:[id number] DOB: Apr 09, 1965 DOA: 12/23/2023     5 DOS: the patient was seen and examined on 12/29/2023   Brief hospital course: RAYSHUN KANDLER is a 58 y.o. 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  Patient also had 50 pounds of weight gain over the past few months. Patient was given IV Lasix , consulted nephrology. Changed to Lasix  infusion 11/1. Patient had a worsening renal function after Lasix , permacath was placed 11/4, hemodialysis started.  11/5.  Patient seen at the end of hemodialysis.  Only remove 300 mL secondary to low blood pressure.  Assessment and Plan: * Acute on chronic systolic CHF (congestive heart failure) (HCC) Patient on Lasix  drip.  Dialysis started yesterday.  Hypotension limiting medications.  Acute kidney injury superimposed on CKD Acute kidney injury on CKD stage IV.  Renal function worsened with diuretics started on hemodialysis.  Paroxysmal atrial fibrillation (HCC) Continue amiodarone  and Eliquis .  Hypothyroidism Continue Synthroid   Gout Continue allopurinol .  Obesity (BMI 30-39.9) Current BMI 39.97  Acute lower UTI Ruled out.  Urine culture negative  Conjunctivitis Ciloxan  eyedrop  Pancreatic insufficiency Continue Creon .  Type 2 diabetes mellitus with chronic kidney disease, without long-term current use of insulin  (HCC) Hemoglobin A1c 7.4.  Continue to monitor with diet  Dyslipidemia Continue statin therapy.        Subjective: Patient states he feels weak.  Some drainage on the left thigh.  Blood pressure low  during dialysis limiting how much fluid able to be removed.  Physical Exam: Vitals:   12/29/23 0930 12/29/23 1000 12/29/23 1030 12/29/23 1039  BP: (!) 76/58 (!) 83/59 99/74 108/79  Pulse: (!) 115 (!) 46 (!) 38 (!) 101  Resp: 20 19 18 19   Temp:   98.3 F (36.8 C)   TempSrc:   Oral   SpO2: 94% 98% 99% 98%  Weight:   130 kg   Height:       Physical Exam HENT:     Head: Normocephalic.     Mouth/Throat:     Pharynx: No oropharyngeal exudate.  Eyes:     General: Lids are normal.     Conjunctiva/sclera: Conjunctivae normal.  Cardiovascular:     Rate and Rhythm: Normal rate and regular rhythm.     Heart sounds: Normal heart sounds, S1 normal and S2 normal.  Pulmonary:     Breath sounds: No decreased breath sounds, wheezing, rhonchi or rales.  Abdominal:     Palpations: Abdomen is soft.     Tenderness: There is no abdominal tenderness.  Musculoskeletal:     Right lower leg: Swelling present.     Left lower leg: Swelling present.  Skin:    General: Skin is warm.     Comments: Chronic lower extremity skin discoloration  Neurological:     Mental Status: He is alert and oriented to person, place, and time.     Data Reviewed: Sodium 134, creatinine 4.85, albumin  3.2, GFR 13, white blood cell count 6.6, hemoglobin 12.2, platelet count 197  Family Communication: updated brother on the phone  Disposition: Status is: Inpatient Remains inpatient appropriate because: Seen on dialysis today  Planned Discharge Destination: Home  Time spent: 28 minutes  Author: Charlie Patterson, MD 12/29/2023 12:25 PM  For on call review [email or web address].

## 2023-12-29 NOTE — Assessment & Plan Note (Addendum)
 Acute kidney injury on CKD stage IV.  Renal function worsened with diuretics and started on hemodialysis on 11/4.  Patient will be on Tuesday Thursday Saturday schedule.  Last creatinine 5.66.

## 2023-12-29 NOTE — Progress Notes (Signed)
 Hemodialysis Note:  Received patient in bed to unit. Alert and oriented. Informed consent singed and in chart.  Treatment initiated: 0758 Treatment completed: 1030  Access used: Right internal jugular catheter Access issues: None  Patient tolerated well. Transported back to room, alert without acute distress. Report given to patient's RN.  Total UF removed: 400  ml Medications given: None  Post HD weight: 130 Kg  [facility]

## 2023-12-30 ENCOUNTER — Inpatient Hospital Stay: Admitting: Family

## 2023-12-30 DIAGNOSIS — I5023 Acute on chronic systolic (congestive) heart failure: Secondary | ICD-10-CM | POA: Diagnosis not present

## 2023-12-30 DIAGNOSIS — I480 Paroxysmal atrial fibrillation: Secondary | ICD-10-CM | POA: Diagnosis not present

## 2023-12-30 DIAGNOSIS — R0902 Hypoxemia: Secondary | ICD-10-CM | POA: Insufficient documentation

## 2023-12-30 DIAGNOSIS — N179 Acute kidney failure, unspecified: Secondary | ICD-10-CM | POA: Diagnosis not present

## 2023-12-30 DIAGNOSIS — E039 Hypothyroidism, unspecified: Secondary | ICD-10-CM | POA: Diagnosis not present

## 2023-12-30 DIAGNOSIS — R531 Weakness: Secondary | ICD-10-CM

## 2023-12-30 LAB — RENAL FUNCTION PANEL
Albumin: 3.1 g/dL — ABNORMAL LOW (ref 3.5–5.0)
Anion gap: 11 (ref 5–15)
BUN: 48 mg/dL — ABNORMAL HIGH (ref 6–20)
CO2: 26 mmol/L (ref 22–32)
Calcium: 8.2 mg/dL — ABNORMAL LOW (ref 8.9–10.3)
Chloride: 95 mmol/L — ABNORMAL LOW (ref 98–111)
Creatinine, Ser: 5.03 mg/dL — ABNORMAL HIGH (ref 0.61–1.24)
GFR, Estimated: 12 mL/min — ABNORMAL LOW (ref >60)
Glucose, Bld: 145 mg/dL — ABNORMAL HIGH (ref 70–99)
Phosphorus: 4.8 mg/dL — ABNORMAL HIGH (ref 2.5–4.6)
Potassium: 3.7 mmol/L (ref 3.5–5.1)
Sodium: 132 mmol/L — ABNORMAL LOW (ref 135–145)

## 2023-12-30 MED ORDER — RENA-VITE PO TABS
1.0000 | ORAL_TABLET | Freq: Every day | ORAL | Status: DC
  Administered 2023-12-30 – 2024-01-06 (×8): 1 via ORAL
  Filled 2023-12-30 (×8): qty 1

## 2023-12-30 MED ORDER — HEPARIN SODIUM (PORCINE) 1000 UNIT/ML IJ SOLN
INTRAMUSCULAR | Status: AC
  Filled 2023-12-30: qty 5

## 2023-12-30 MED ORDER — ALTEPLASE 2 MG IJ SOLR: 2.0000 mg | Freq: Once | INTRAMUSCULAR | Status: DC | PRN

## 2023-12-30 MED ORDER — LACTULOSE 10 GM/15ML PO SOLN
30.0000 g | Freq: Once | ORAL | Status: AC
  Administered 2023-12-30: 30 g via ORAL
  Filled 2023-12-30: qty 60

## 2023-12-30 MED ORDER — NEPRO/CARBSTEADY PO LIQD
237.0000 mL | Freq: Every day | ORAL | Status: DC
  Administered 2023-12-31 – 2024-01-03 (×4): 237 mL via ORAL

## 2023-12-30 MED ORDER — POLYETHYLENE GLYCOL 3350 17 G PO PACK
17.0000 g | PACK | Freq: Every day | ORAL | Status: DC
  Administered 2023-12-30 – 2023-12-31 (×2): 17 g via ORAL
  Filled 2023-12-30 (×3): qty 1

## 2023-12-30 MED ORDER — HEPARIN SODIUM (PORCINE) 1000 UNIT/ML DIALYSIS
1000.0000 [IU] | INTRAMUSCULAR | Status: DC | PRN
Start: 2023-12-30 — End: 2024-01-01

## 2023-12-30 MED ORDER — PROSOURCE PLUS PO LIQD
30.0000 mL | Freq: Three times a day (TID) | ORAL | Status: DC
  Administered 2023-12-30 – 2024-01-07 (×16): 30 mL via ORAL

## 2023-12-30 MED ORDER — HEPARIN SODIUM (PORCINE) 1000 UNIT/ML DIALYSIS: 1000.0000 [IU] | INTRAMUSCULAR | Status: DC | PRN

## 2023-12-30 NOTE — Progress Notes (Signed)
 Progress Note   Patient: Hector [name] FMW:[id number] DOB: March 01, 1965 DOA: 12/23/2023     6 DOS: the patient was seen and examined on 12/30/2023   Brief hospital course: Hector [name] is a 58 y.o. 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  Patient also had 50 pounds of weight gain over the past few months. Patient was given IV Lasix , consulted nephrology. Changed to Lasix  infusion 11/1. Patient had a worsening renal function after Lasix , permacath was placed 11/4, hemodialysis started.  11/5.  Patient seen at the end of hemodialysis.  Only remove 300 mL secondary to low blood pressure. 11/6.  Patient seen before hemodialysis.  Physical therapy will recommend rehab.  TOC consulted.   Assessment and Plan: * Acute on chronic systolic CHF (congestive heart failure) (HCC) Lasix  drip stopped.  Dialysis started on 11/4.  Hypotension limiting medications for heart failure.  Unable to remove much fluid yesterday secondary to low blood pressure.  Acute kidney injury superimposed on CKD Acute kidney injury on CKD stage IV.  Renal function worsened with diuretics and started on hemodialysis on 11/4.  Paroxysmal atrial fibrillation (HCC) Continue amiodarone  and Eliquis .  Hypothyroidism Continue Synthroid   Gout Continue allopurinol .  Obesity (BMI 30-39.9) Current BMI 41.29.  BMI up from yesterday now in the morbid obesity range.  Acute lower UTI Ruled out.  Urine culture negative  Hypoxia Today had a pulse ox of 89% on 2 L of oxygen.  Continue to try to taper off oxygen.  Weakness Physical therapy recommending  rehab  Conjunctivitis Ciloxan  eyedrop  Pancreatic insufficiency Continue Creon .  Type 2 diabetes mellitus with chronic kidney disease, without long-term current use of insulin  (HCC) Hemoglobin A1c 7.4.  Continue to monitor with diet  Dyslipidemia Continue statin therapy.        Subjective: Patient feels weak.  Felt a little lightheaded with standing.  Asked staff to check orthostatic vitals.  Came in with abdominal pain and found to have heart failure exacerbation.  Physical Exam: Vitals:   12/30/23 1000 12/30/23 1030 12/30/23 1100 12/30/23 1130  BP: (!) 115/92 116/80 (!) 106/91 103/87  Pulse: 98 99 90 99  Resp:  19 (!) 31 20  Temp:      TempSrc:      SpO2: 98% 98% 100% 99%  Weight:      Height:       Physical Exam HENT:     Head: Normocephalic.     Mouth/Throat:     Pharynx: No oropharyngeal exudate.  Eyes:     General: Lids are normal.     Conjunctiva/sclera: Conjunctivae normal.  Cardiovascular:     Rate and Rhythm: Normal rate and regular rhythm.     Heart sounds: Normal heart sounds, S1 normal and S2 normal.  Pulmonary:     Breath sounds: No decreased breath sounds, wheezing, rhonchi or rales.  Abdominal:     Palpations: Abdomen is soft.     Tenderness: There is no abdominal tenderness.  Musculoskeletal:     Right lower leg: Swelling present.     Left lower leg: Swelling present.  Skin:    General: Skin is warm.     Comments: Chronic lower extremity skin  discoloration  Neurological:     Mental Status: He is alert and oriented to person, place, and time.     Data Reviewed: Creatinine 5.03, sodium 132, glucose 145, albumin  3.1, phosphorus 4.8 with hemoglobin 12.2  Disposition: Status is: Inpatient Remains inpatient appropriate because: Physical therapy recommending rehab, TOC to speak with patient  Planned Discharge Destination: Rehab    Time spent: 28 minutes  Author: Charlie Patterson, MD 12/30/2023 12:07 PM  For on call review  [email or web address].

## 2023-12-30 NOTE — Assessment & Plan Note (Addendum)
 Physical therapy recommending rehab.  Walked 4 feet with physical therapy today.

## 2023-12-30 NOTE — TOC Progression Note (Signed)
 Transition of Care [facility]) - Progression Note    Patient Details  Name: LETCHER SCHWEIKERT MRN: [id number] Date of Birth: 12/17/1965  [facility]) CM/SW Contact  Shasta DELENA Daring, RN Phone Number: 12/30/2023, 4:28 PM  Clinical Narrative:    11/6 Patient agreeable to SNF. RNCM has started placement work up.    Expected Discharge Plan: Home/Self Care Barriers to Discharge: Continued Medical Work up               Expected Discharge Plan and Services     Post Acute Care Choice: NA Living arrangements for the past 2 months: Single Family Home                                       Social Drivers of Health (SDOH) Interventions SDOH Screenings   Food Insecurity: No Food Insecurity (12/16/2023)  Housing: Unknown (12/16/2023)  Transportation Needs: No Transportation Needs (12/16/2023)  Utilities: Not At Risk (12/16/2023)  Alcohol Screen: Low Risk  (01/19/2023)  Depression (PHQ2-9): Low Risk  (08/19/2021)  Financial Resource Strain: Low Risk  (01/19/2023)  Physical Activity: Inactive (01/04/2019)  Social Connections: Unknown (12/16/2023)  Stress: No Stress Concern Present (01/04/2019)  Tobacco Use: Medium Risk (12/23/2023)    Readmission Risk Interventions    12/27/2023    3:10 PM 04/27/2022   11:48 AM 02/24/2022    3:53 PM  Readmission Risk Prevention Plan  Transportation Screening Complete Complete Complete  Medication Review Oceanographer) Complete Complete Complete  PCP or Specialist appointment within 3-5 days of discharge Complete Complete   SW Recovery Care/Counseling Consult Complete Complete Complete  Palliative Care Screening Not Applicable Not Applicable Not Applicable  Skilled Nursing Facility Not Applicable Not Applicable Not Applicable

## 2023-12-30 NOTE — Progress Notes (Signed)
 [facility]  ROUNDING NOTE   Subjective:   Hector [name] is a 58 y.o. male with past medical conditions including hypertension, paroxysmal A-fib on Eliquis , CHF with a EF less than 20%, and chronic kidney disease stage IV. Patient presents to ED with shortness of breath and edema.  Patient currently admitted for Acute on chronic systolic CHF (congestive heart failure) (HCC) [I50.23] Acute on chronic congestive heart failure, unspecified heart failure type [facility]) [I50.9]  Patient known to our practice from previous admissions and is followed by [facility] nephrology outpatient.    Update Patient seen and evaluated during dialysis   HEMODIALYSIS FLOWSHEET:  Blood Flow Rate (mL/min): 399 mL/min Arterial Pressure (mmHg): -211.1 mmHg Venous Pressure (mmHg): 184.03 mmHg TMP (mmHg): -1.82 mmHg Ultrafiltration Rate (mL/min): 543 mL/min Dialysate Flow Rate (mL/min): 300 ml/min Dialysis Fluid Bolus: Normal Saline  Tolerating treatment well, seated in chair  Objective:  Vital signs in last 24 hours:  Temp:  [98.1 F (36.7 C)-99 F (37.2 C)] 98.3 F (36.8 C) (11/06 0920) Pulse Rate:  [38-111] 95 (11/06 0951) Resp:  [18-22] 18 (11/06 0951) BP: (97-109)/(71-93) 109/93 (11/06 0951) SpO2:  [89 %-99 %] 98 % (11/06 0951) Weight:  [130 kg-134.3 kg] 134.3 kg (11/06 0519)  Weight change: -2 kg Filed Weights   12/29/23 0745 12/29/23 1030 12/30/23 0519  Weight: 130.4 kg 130 kg 134.3 kg    Intake/Output: I/O last 3 completed shifts: In: 1043.8 [P.O.:900; I.V.:143.8] Out: 1950 [Urine:1550; Other:400]   Intake/Output this shift:  No intake/output data recorded.  Physical Exam: General: NAD  Head: Normocephalic, atraumatic. Moist oral mucosal membranes  Eyes: Anicteric  Lungs:  Clear to auscultation, normal effort  Heart: Regular rate and rhythm  Abdomen:  Soft, nontender, distended  Extremities: 2-3+ peripheral edema.  Neurologic: Awake and alert  Skin: Warm,dry, no rash   Access Rt internal jugular permcath  Basic Metabolic Panel: Recent Labs  Lab 12/26/23 0500 12/27/23 0351 12/28/23 0428 12/29/23 0547 12/30/23 0422  NA 135 134* 134* 134* 132*  K 3.8 4.1 3.8 3.8 3.7  CL 99 98 96* 94* 95*  CO2 24 24 26 25 26   GLUCOSE 117* 110* 100* 88 145*  BUN 61* 63* 65* 56* 48*  CREATININE 4.94* 4.91* 5.07* 4.85* 5.03*  CALCIUM  8.6* 8.8* 8.9 8.3* 8.2*  MG 2.4  --  2.4  --   --   PHOS 4.7* 4.9* 5.3* 5.1* 4.8*    Liver Function Tests: Recent Labs  Lab 12/23/23 1651 12/25/23 0624 12/26/23 0500 12/27/23 0351 12/28/23 0428 12/29/23 0547 12/30/23 0422  AST 109*  --   --   --   --   --   --   ALT 58*  --   --   --   --   --   --   ALKPHOS 75  --   --   --   --   --   --   BILITOT 1.7*  --   --   --   --   --   --   PROT 7.5  --   --   --   --   --   --   ALBUMIN  3.9   < > 3.4* 3.6 3.9 3.2* 3.1*   < > = values in this interval not displayed.   Recent Labs  Lab 12/23/23 1651  LIPASE 23   No results for input(s): AMMONIA in the last 168 hours.  CBC: Recent Labs  Lab 12/23/23 1651 12/24/23  9567 12/28/23 1315 12/29/23 0820  WBC 7.3 8.3 8.3 6.6  HGB 14.1 13.8 12.6* 12.2*  HCT 43.2 43.4 38.9* 37.1*  MCV 86.2 88.8 86.3 86.9  PLT 194 191 194 197    Cardiac Enzymes: No results for input(s): CKTOTAL, CKMB, CKMBINDEX, TROPONINI in the last 168 hours.  BNP: Invalid input(s): POCBNP  CBG: No results for input(s): GLUCAP in the last 168 hours.  Microbiology: Results for orders placed or performed during the hospital encounter of 12/23/23  Urine Culture (for pregnant, neutropenic or urologic patients or patients with an indwelling urinary catheter)     Status: Abnormal   Collection Time: 12/23/23  7:40 PM   Specimen: Urine, Catheterized  Result Value Ref Range Status   Specimen Description   Final    URINE, CATHETERIZED Performed at [facility], [address] [place], [place] [address]    Special Requests   Final     NONE Performed at [facility], [address], [place], [place] [address]    Culture (A)  Final    <10,000 COLONIES/mL INSIGNIFICANT GROWTH Performed at [facility], [address], [place], [place] [address]    Report Status 12/25/2023 FINAL  Final    Coagulation Studies: No results for input(s): LABPROT, INR in the last 72 hours.  Urinalysis: No results for input(s): COLORURINE, LABSPEC, PHURINE, GLUCOSEU, HGBUR, BILIRUBINUR, KETONESUR, PROTEINUR, UROBILINOGEN, NITRITE, LEUKOCYTESUR in the last 72 hours.  Invalid input(s): APPERANCEUR     Imaging: PERIPHERAL VASCULAR CATHETERIZATION Result Date: 12/28/2023 See surgical note for result.     Medications:      allopurinol   100 mg Oral Daily   amiodarone   200 mg Oral Daily   apixaban   5 mg Oral BID   atorvastatin   80 mg Oral Daily   Chlorhexidine  Gluconate Cloth  6 each Topical Q0600   ciprofloxacin   2 drop Both Eyes Q4H while awake   levothyroxine   125 mcg Oral Q0600   lidocaine   1 patch Transdermal Q12H   lipase/protease/amylase  36,000 Units Oral TID AC   acetaminophen  **OR** acetaminophen , magnesium  hydroxide, methocarbamol , ondansetron  **OR** ondansetron  (ZOFRAN ) IV, mouth rinse, traZODone   Assessment/ Plan:  Hector [name] is a 58 y.o.  male with past medical conditions including hypertension, paroxysmal A-fib on Eliquis , CHF with a EF less than 20%, and chronic kidney disease stage IV.  Patient currently admitted for Acute on chronic systolic CHF (congestive heart failure) (HCC) [I50.23] Acute on chronic congestive heart failure, unspecified heart failure type (HCC) [I50.9]   Acute Kidney Injury on chronic kidney disease stage 4 with baseline creatinine 3.90 and GFR of 17 on 09/13/23. Most recent outpatient labs show creatinine 5.94 with GFR 10 on 12/07/23. Acute kidney injury likely has component of cardiorenal syndrome but also progression of kidney  disease.   Chronic kidney disease is secondary to hypertension and CAD.  No recent IV contrast exposure.    Due to persistent kidney failure and multiple readmissions due to volume overload, patient was agreeable to proceed with renal replacement therapy. Appreciate vascular surgery placing Rt internal jugular permcath. Received second treatment yesterday, UF reduced due to hypotension. Receiving third treatment today, UF goal 1-1.5L as tolerated. Dialysis navigator will seek outpatient dialysis treatment clinic. IV Furosemide  drip stopped.   Lab Results  Component Value Date   CREATININE 5.03 (H) 12/30/2023   CREATININE 4.85 (H) 12/29/2023   CREATININE 5.07 (H) 12/28/2023    Intake/Output Summary (Last 24 hours) at 12/30/2023 1000 Last  data filed at 12/30/2023 0334 Gross per 24 hour  Intake 900 ml  Output 1150 ml  Net -250 ml    2. Anemia of chronic kidney disease Lab Results  Component Value Date   HGB 12.2 (L) 12/29/2023    Hgb stable, will continue to monitor for need of ESA  3. Secondary Hyperparathyroidism: with outpatient labs: PTH 1207, phosphorus 5.6, calcium  9.4 on 12/07/2023.   Lab Results  Component Value Date   CALCIUM  8.2 (L) 12/30/2023   CAION 1.13 (L) 06/23/2023   CAION 1.10 (L) 06/23/2023   PHOS 4.8 (H) 12/30/2023    Calcium  and phos acceptable   4. 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 Home regimen includes glipizide. Most recent hemoglobin A1c is 7.3 on 06/28/2023.   Diet controlled   LOS: 6 Hector [name] 11/6/202510:00 AM

## 2023-12-30 NOTE — Progress Notes (Signed)
   12/30/23 1417  Vitals  BP (!) 110/91  Pulse Rate 99  Resp 18  Weight  (Unable to attain. Patient unable to stand. Patient in a recliner for HD)  Type of Weight Post-Dialysis  Oxygen Therapy  SpO2 100 %  Patient Activity (if Appropriate) In chair  Pulse Oximetry Type Continuous  Oximetry Probe Site Changed No  Post Treatment  Dialyzer Clearance Clear  Hemodialysis Intake (mL) 0 mL  Liters Processed 64  Fluid Removed (mL) 1000 mL  Tolerated HD Treatment No (Comment)  Post-Hemodialysis Comments Blood pressure decreased with 14 minutes remaining. Patient was symptomatic. Blood pressure returned to baseline with blood return/rinseback. No verbalized concerns post tx. Catheter is clean dry and intact.

## 2023-12-30 NOTE — NC FL2 (Signed)
 [place]  MEDICAID FL2 LEVEL OF CARE FORM     IDENTIFICATION  Patient Name: Hector [name] Birthdate: 09/25/1965 Sex: male Admission Date (Current Location): 12/23/2023  [place] and [place] Number:  Chiropodist and Address:  [facility], [address] [place], [place] [address]      Provider Number: [id number]  Attending Physician Name and Address:  Josette Ade, MD  Relative Name and Phone Number:       Current Level of Care: Hospital Recommended Level of Care: Skilled Nursing Facility Prior Approval Number:    Date Approved/Denied:   PASRR Number: [id number]  Discharge Plan: SNF    Current Diagnoses: Patient Active Problem List   Diagnosis Date Noted   Weakness 12/30/2023   Hypoxia 12/30/2023   Conjunctivitis 12/29/2023   Encounter for dialysis and dialysis catheter care 12/28/2023   Acute lower UTI 12/24/2023   Paroxysmal atrial fibrillation (HCC) 12/24/2023   Dyslipidemia 12/24/2023   Type 2 diabetes mellitus with chronic kidney disease, without long-term current use of insulin  (HCC) 12/24/2023   Pancreatic insufficiency 12/24/2023   Acute on chronic systolic CHF (congestive heart failure) (HCC) 12/24/2023   AKI (acute kidney injury) 12/16/2023   Coronary artery disease due to lipid rich plaque 07/31/2023   Pulmonary embolus (HCC) 07/31/2023   Acute on chronic HFrEF (heart failure with reduced ejection fraction) (HCC) 07/28/2023   Syncope 07/28/2023   COPD with acute exacerbation (HCC) 07/28/2023   Intractable vomiting with nausea 06/21/2023   NSTEMI (non-ST elevated myocardial infarction) (HCC) 06/21/2023   Polycythemia 01/16/2023   Acute pancreatitis 09/26/2022   Abdominal pain, epigastric 04/27/2022   Alcoholic cirrhosis of liver without ascites (HCC) 04/27/2022   CHF exacerbation (HCC) 04/26/2022   Hyperkalemia 04/25/2022   Hyperbilirubinemia 04/25/2022   Thrombocytopenia 04/25/2022   History of GI bleed  04/25/2022   Acute renal failure superimposed on stage 4 chronic kidney disease (HCC) 02/23/2022   Hyponatremia 02/23/2022   Abdominal pain 02/23/2022   Acute kidney injury superimposed on CKD 02/23/2022   Type II diabetes mellitus with renal manifestations (HCC) 02/23/2022   RSV infection 02/23/2022   HLD (hyperlipidemia) 01/24/2022   Myocardial injury 01/24/2022   Type 2 diabetes mellitus (HCC) 12/10/2021   Pulmonary nodule 12/07/2021   Acute on chronic congestive heart failure (HCC) 11/18/2021   Gout 11/18/2021   Atrial fibrillation with RVR (HCC) 10/13/2021   Obesity (BMI 30-39.9) 10/01/2021   End-stage systolic heart failure, acute on chronic (HCC) 09/28/2021   Right upper quadrant pain 09/10/2021   Nausea and vomiting 09/10/2021   Atrial flutter with rapid ventricular response (HCC) 03/01/2021   Hypothyroidism 11/30/2020   Stroke (HCC)    Alcohol use, unspecified, in remission    Chronic renal impairment, stage 4 (severe)    Chronic systolic CHF (congestive heart failure) (HCC)    Iron  deficiency anemia    Biventricular failure (HCC) 09/12/2020   UTI (urinary tract infection) 01/30/2020   Acute renal failure superimposed on stage 3b chronic kidney disease (HCC) 12/31/2019   Acute respiratory failure with hypoxia (HCC) 12/22/2019   OSA (obstructive sleep apnea) 04/21/2016   Nonischemic cardiomyopathy (HCC) 05/26/2015   Chronic combined systolic (congestive) and diastolic (congestive) heart failure (HCC) 04/01/2015   Essential hypertension 04/01/2015   Nonischemic cardiomyopathy: EF 20-25% 09/21/2014   Paroxysmal A-fib (HCC) 08/16/2014   Paroxysmal atrial flutter (HCC) 08/14/2014   Hypertensive heart disease    Mixed hyperlipidemia     Orientation RESPIRATION BLADDER Height & Weight  Self, Time, Situation, Place  Normal Continent Weight:  (Unable to attain. Patient unable to stand. Patient in a recliner for HD) Height:  5' 11 (180.3 cm)  BEHAVIORAL SYMPTOMS/MOOD  NEUROLOGICAL BOWEL NUTRITION STATUS   (None)  (none) Continent Diet (Renal diet with 1200 ml fluid restriction)  AMBULATORY STATUS COMMUNICATION OF NEEDS Skin   Extensive Assist Verbally Normal                       Personal Care Assistance Level of Assistance  Bathing, Dressing, Feeding Bathing Assistance: Limited assistance Feeding assistance: Independent Dressing Assistance: Limited assistance     Functional Limitations Info  Sight, Hearing, Speech (none) Sight Info: Adequate Hearing Info: Adequate Speech Info: Adequate    SPECIAL CARE FACTORS FREQUENCY  PT (By licensed PT)     PT Frequency: 5x week              Contractures Contractures Info: Not present    Additional Factors Info  Allergies, Code Status Code Status Info: full code Allergies Info: Esomeprazole Magnesium , egg and egg-containing drug products           Current Medications (12/30/2023):  This is the current hospital active medication list Current Facility-Administered Medications  Medication Dose Route Frequency Provider Last Rate Last Admin   (feeding supplement) PROSource Plus liquid 30 mL  30 mL Oral TID BM Wieting, Richard, MD   30 mL at 12/30/23 1502   acetaminophen  (TYLENOL ) tablet 650 mg  650 mg Oral Q6H PRN Schnier, Gregory G, MD   650 mg at 12/29/23 2051   Or   acetaminophen  (TYLENOL ) suppository 650 mg  650 mg Rectal Q6H PRN Schnier, Gregory G, MD       allopurinol  (ZYLOPRIM ) tablet 100 mg  100 mg Oral Daily Schnier, Gregory G, MD   100 mg at 12/30/23 0825   alteplase (CATHFLO ACTIVASE) injection 2 mg  2 mg Intracatheter Once PRN Druscilla Bald, NP       alteplase (CATHFLO ACTIVASE) injection 2 mg  2 mg Intracatheter Once PRN Druscilla Bald, NP       alteplase (CATHFLO ACTIVASE) injection 2 mg  2 mg Intracatheter Once PRN Druscilla Bald, NP       amiodarone  (PACERONE ) tablet 200 mg  200 mg Oral Daily Schnier, Gregory G, MD   200 mg at 12/30/23 0825   apixaban  (ELIQUIS )  tablet 5 mg  5 mg Oral BID Schnier, Gregory G, MD   5 mg at 12/30/23 0825   atorvastatin  (LIPITOR ) tablet 80 mg  80 mg Oral Daily Schnier, Gregory G, MD   80 mg at 12/30/23 0825   Chlorhexidine  Gluconate Cloth 2 % PADS 6 each  6 each Topical Q0600 Schnier, Cordella MATSU, MD   6 each at 12/30/23 0558   ciprofloxacin  (CILOXAN ) 0.3 % ophthalmic solution 2 drop  2 drop Both Eyes Q4H while awake Josette Ade, MD   2 drop at 12/30/23 1459   feeding supplement (NEPRO CARB STEADY) liquid 237 mL  237 mL Oral QHS Wieting, Richard, MD       heparin  injection 1,000 Units  1,000 Units Intracatheter PRN Druscilla Bald, NP       heparin  injection 1,000 Units  1,000 Units Intracatheter PRN Druscilla Bald, NP       heparin  injection 1,000 Units  1,000 Units Intracatheter PRN Druscilla Bald, NP       levothyroxine  (SYNTHROID ) tablet 125 mcg  125 mcg Oral Q0600 Schnier, Cordella MATSU, MD  125 mcg at 12/30/23 9390   lidocaine  (LIDODERM ) 5 % 1 patch  1 patch Transdermal Q12H Schnier, Gregory G, MD   1 patch at 12/30/23 0825   lipase/protease/amylase (CREON ) capsule 36,000 Units  36,000 Units Oral TID AC Schnier, Gregory G, MD   36,000 Units at 12/30/23 1459   magnesium  hydroxide (MILK OF MAGNESIA) suspension 30 mL  30 mL Oral Daily PRN Schnier, Gregory G, MD       methocarbamol  (ROBAXIN ) tablet 500 mg  500 mg Oral Q8H PRN Schnier, Gregory G, MD   500 mg at 12/30/23 9390   multivitamin (RENA-VIT) tablet 1 tablet  1 tablet Oral QHS Wieting, Richard, MD       ondansetron  (ZOFRAN ) tablet 4 mg  4 mg Oral Q6H PRN Schnier, Cordella MATSU, MD       Or   ondansetron  (ZOFRAN ) injection 4 mg  4 mg Intravenous Q6H PRN Schnier, Gregory G, MD   4 mg at 12/24/23 2028   Oral care mouth rinse  15 mL Mouth Rinse PRN Schnier, Gregory G, MD       polyethylene glycol (MIRALAX  / GLYCOLAX ) packet 17 g  17 g Oral Daily Wieting, Richard, MD       traZODone  (DESYREL ) tablet 25 mg  25 mg Oral QHS PRN Schnier, Gregory G, MD   25 mg at 12/29/23  2047     Discharge Medications: Please see discharge summary for a list of discharge medications.  Relevant Imaging Results:  Relevant Lab Results:   Additional Information SS #[id number],  New dialysis patient  Shasta DELENA Daring, RN

## 2023-12-30 NOTE — Evaluation (Signed)
 Physical Therapy Evaluation Patient Details Name: Hector [name] MRN: [id number] DOB: 03-14-1965 Today's Date: 12/30/2023  History of Present Illness  Hector [name] is a 58 y.o. 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  Patient also had 50 pounds of weight gain over the past few months.  Patient was given IV Lasix , consulted nephrology.  Changed to Lasix  infusion 11/1.  Patient had a worsening renal function after Lasix , permacath was placed 11/4, hemodialysis started.  Clinical Impression  Pt is a pleasant 58 year old male who was admitted for acute/chronic CHF. Pt performs bed mobility with mod assist and transfers with min assist +2. Unable to ambulate at this time due to weakness. Multiple transfers performed, pt able to demonstrate improved safety with RW compared to no AD. Pt then able to perform SPT between bed->recliner with min assist +2 and RW. Recommend continued transfers with nursing staff using RW. Pt does complain of dizziness upon standing, unable to stand long enough to obtain orthostatic vitals. Pt demonstrates deficits with strength/mobility/balance. Would benefit from skilled PT to address above deficits and promote optimal return to PLOF. Pt will continue to receive skilled PT services while admitted and will defer to TOC/care team for updates regarding disposition planning. Will benefit optimally from moderate intensity post-acute PT services (<3 hours/day) and consistent staff assist for ADLs/mobility.       If plan is discharge home, recommend the following: Two people to help with walking and/or  transfers;A lot of help with bathing/dressing/bathroom;Help with stairs or ramp for entrance   Can travel by private vehicle   No    Equipment Recommendations Rolling walker (2 wheels);BSC/3in1  Recommendations for Other Services       Functional Status Assessment Patient has had a recent decline in their functional status and demonstrates the ability to make significant improvements in function in a reasonable and predictable amount of time.     Precautions / Restrictions Precautions Precautions: Fall Recall of Precautions/Restrictions: Intact Restrictions Weight Bearing Restrictions Per Provider Order: No      Mobility  Bed Mobility Overal bed mobility: Needs Assistance Bed Mobility: Supine to Sit     Supine to sit: Mod assist     General bed mobility comments: needs cues for hand placement and assist with trunkal elevation. Once seated at EOB, able to sit with supervision    Transfers Overall transfer level: Needs assistance Equipment used: 2 person hand held assist, Rolling walker (2 wheels) Transfers: Sit to/from Stand, Bed to chair/wheelchair/BSC Sit to Stand: Min assist, +2 physical assistance, +2 safety/equipment, From elevated surface   Step pivot transfers: Min assist, +2 physical assistance, +2 safety/equipment, From elevated surface       General transfer comment: First transfer with +2 HHA as no RW in room. Pt required mod assist and reports of dizziness. Further transfers performed with RW and min A +2. Pt then able to take short shuffling steps from bed->chair with heavy cues for sequencing    Ambulation/Gait               General Gait Details: unable at this time  Careers Information Officer  Tilt Bed    Modified Rankin (Stroke Patients Only)       Balance Overall balance assessment: Needs assistance Sitting-balance support: No upper extremity supported, Feet supported Sitting balance-Leahy Scale: Fair      Standing balance support: Bilateral upper extremity supported Standing balance-Leahy Scale: Poor                               Pertinent Vitals/Pain Pain Assessment Pain Assessment: Faces Faces Pain Scale: Hurts little more Pain Location: B knees Pain Descriptors / Indicators: Aching Pain Intervention(s): Limited activity within patient's tolerance    Home Living Family/patient expects to be discharged to:: Private residence Living Arrangements: Other relatives Available Help at Discharge: Family Type of Home: House Home Access: Ramped entrance       Home Layout: One level Home Equipment: Agricultural Consultant (2 wheels) Additional Comments: lives with mother and brother. Reports he has RW from when he had covid. Mother uses WC at baseline    Prior Function Prior Level of Function : Independent/Modified Independent             Mobility Comments: IND without AD household distances. Reports no recent falls ADLs Comments: reports IND with ADL management; brother drives him to MD appts, he does not go to grocery store     Extremity/Trunk Assessment   Upper Extremity Assessment Upper Extremity Assessment: Generalized weakness    Lower Extremity Assessment Lower Extremity Assessment: Generalized weakness (B LE grossly 3/5)       Communication   Communication Communication: No apparent difficulties    Cognition Arousal: Alert Behavior During Therapy: WFL for tasks assessed/performed   PT - Cognitive impairments: No apparent impairments                       PT - Cognition Comments: pleasant and agreeable to session Following commands: Intact       Cueing Cueing Techniques: Verbal cues     General Comments      Exercises Other Exercises Other Exercises: able to perform SPT from bed->dialysis chair using RW. Heavy education given to nursing staff for safe transfer. O2 weaned with O2 sats at 90% on RA.   Assessment/Plan    PT Assessment  Patient needs continued PT services  PT Problem List Decreased strength;Decreased balance;Decreased mobility;Decreased activity tolerance;Decreased knowledge of use of DME;Decreased safety awareness       PT Treatment Interventions DME instruction;Gait training;Therapeutic activities;Therapeutic exercise;Balance training    PT Goals (Current goals can be found in the Care Plan section)  Acute Rehab PT Goals Patient Stated Goal: to go home PT Goal Formulation: With patient Time For Goal Achievement: 01/13/24 Potential to Achieve Goals: Good    Frequency Min 2X/week     Co-evaluation               AM-PAC PT 6 Clicks Mobility  Outcome Measure Help needed turning from your back to your side while in a flat bed without using bedrails?: A Little Help needed moving from lying on your back to sitting on the side of a flat bed without using bedrails?: A Little Help needed moving to and from a bed to a chair (including a wheelchair)?: A Lot Help needed standing up from a chair using your arms (e.g., wheelchair or bedside chair)?: A Little Help needed to walk in hospital room?: Total Help needed climbing 3-5 steps with a railing? : Total 6 Click  Score: 13    End of Session Equipment Utilized During Treatment: Oxygen Activity Tolerance: Patient limited by fatigue Patient left: in chair (left with transport staff to go to HD) Nurse Communication: Mobility status PT Visit Diagnosis: Unsteadiness on feet (R26.81);Muscle weakness (generalized) (M62.81);Difficulty in walking, not elsewhere classified (R26.2)    Time: 9146-9084 PT Time Calculation (min) (ACUTE ONLY): 22 min   Charges:   PT Evaluation $PT Eval Moderate Complexity: 1 Mod PT Treatments $Therapeutic Activity: 8-22 mins PT General Charges $$ ACUTE PT VISIT: 1 Visit         Corean Dade, PT, DPT, GCS [phone]   Melaney Tellefsen 12/30/2023, 11:31 AM

## 2023-12-30 NOTE — Discharge Instructions (Signed)
 [facility]

## 2023-12-30 NOTE — Progress Notes (Signed)
 Initial Nutrition Assessment  DOCUMENTATION CODES:   Morbid obesity  INTERVENTION:   -Renal MVI daily -Continue renal diet with 1.2 L fluid restriction -Nepro Shake po daily, each supplement provides 425 kcal and 19 grams protein  -30 ml Prosource Plus TID, each supplement provides 100 kcals and 15 grams protein -RD provided Nutrition for Dialysis handout from [facility]; attached to AVS/ discharge summary; patient will also have access to outpatient HD at home HD center   NUTRITION DIAGNOSIS:   Increased nutrient needs related to chronic illness (ESRD on HD) as evidenced by estimated needs.  GOAL:   Patient will meet greater than or equal to 90% of their needs  MONITOR:   PO intake, Supplement acceptance  REASON FOR ASSESSMENT:   Consult Diet education  ASSESSMENT:   58 y.o. African-American male with medical history significant for chronic combined systolic and diastolic CHF with EF less than 20%, essential hypertension, dyslipidemia, paroxysmal atrial fibrillation, CVA and alcohol abuse who presented with acute onset of worsening dyspnea with associated lower extremity edema, orthopnea and paroxysmal nocturnal dyspnea as well as dyspnea  Patient admitted with CHF and CKD stage IV.   11/4- s/p Insertion of tunneled dialysis catheter right IJ approach with ultrasound and fluoroscopic guidance, HD initiated  Reviewed I/O's: -8506 ml x 24 hours and -7.6 L since admission  UOP: 1.2 L x 24 hours  Patient down in HD suite at time of visit. No family present. RD unable to obtain further history or complete nutrition-focused physical exam at this time.   Patient currently on a renal diet with 1.2 L fluid restriction. Noted fair to good meal intake; PO 50-90%. Patient is on chronic creon  secondary to pancreatic insufficiency.   Reviewed weight history; no weight loss noted over the past 6 months. Suspect fluid overload may be masking true weight  loss as well as fat and muscle depletions. Per RN assessment, patient with moderate edema. Patient has endorses a 50 pounds weight over the past few months PTA.   Per HD Navigator, patient has HD clinic placement.   Medications reviewed and include creon .   Labs reviewed: Na: 132, Phos: 4.8.    Diet Order:   Diet Order             Diet renal with fluid restriction Fluid restriction: 1200 mL Fluid; Room service appropriate? Yes; Fluid consistency: Thin  Diet effective now                   EDUCATION NEEDS:   No education needs have been identified at this time  Skin:  Skin Assessment: Reviewed RN Assessment  Last BM:  12/24/23  Height:   Ht Readings from Last 1 Encounters:  12/23/23 5' 11 (1.803 m)    Weight:   Wt Readings from Last 1 Encounters:  12/30/23 134.3 kg    Ideal Body Weight:  78.2 kg  BMI:  Body mass index is 41.29 kg/m.  Estimated Nutritional Needs:   Kcal:  2150-2350  Protein:  120-135 grams  Fluid:  1000 ml + UOP    Margery ORN, RD, LDN, CDCES Registered Dietitian III Certified Diabetes Care and Education Specialist If unable to reach this RD, please use RD Inpatient group chat on secure chat between hours of 8am-4 pm daily

## 2023-12-30 NOTE — Assessment & Plan Note (Addendum)
 Previously had a pulse ox of 89% on 2 L of oxygen.  Currently off oxygen.

## 2023-12-31 DIAGNOSIS — E038 Other specified hypothyroidism: Secondary | ICD-10-CM | POA: Diagnosis not present

## 2023-12-31 DIAGNOSIS — K5909 Other constipation: Secondary | ICD-10-CM

## 2023-12-31 DIAGNOSIS — I480 Paroxysmal atrial fibrillation: Secondary | ICD-10-CM | POA: Diagnosis not present

## 2023-12-31 DIAGNOSIS — K5900 Constipation, unspecified: Secondary | ICD-10-CM | POA: Insufficient documentation

## 2023-12-31 DIAGNOSIS — I5023 Acute on chronic systolic (congestive) heart failure: Secondary | ICD-10-CM | POA: Diagnosis not present

## 2023-12-31 DIAGNOSIS — N179 Acute kidney failure, unspecified: Secondary | ICD-10-CM | POA: Diagnosis not present

## 2023-12-31 LAB — RENAL FUNCTION PANEL
Albumin: 3.1 g/dL — ABNORMAL LOW (ref 3.5–5.0)
Anion gap: 12 (ref 5–15)
BUN: 38 mg/dL — ABNORMAL HIGH (ref 6–20)
CO2: 26 mmol/L (ref 22–32)
Calcium: 8.4 mg/dL — ABNORMAL LOW (ref 8.9–10.3)
Chloride: 96 mmol/L — ABNORMAL LOW (ref 98–111)
Creatinine, Ser: 4.23 mg/dL — ABNORMAL HIGH (ref 0.61–1.24)
GFR, Estimated: 15 mL/min — ABNORMAL LOW (ref >60)
Glucose, Bld: 118 mg/dL — ABNORMAL HIGH (ref 70–99)
Phosphorus: 4.7 mg/dL — ABNORMAL HIGH (ref 2.5–4.6)
Potassium: 3.7 mmol/L (ref 3.5–5.1)
Sodium: 134 mmol/L — ABNORMAL LOW (ref 135–145)

## 2023-12-31 MED ORDER — BISACODYL 10 MG RE SUPP
10.0000 mg | Freq: Once | RECTAL | Status: AC
  Administered 2023-12-31: 10 mg via RECTAL
  Filled 2023-12-31: qty 1

## 2023-12-31 MED ORDER — LACTULOSE 10 GM/15ML PO SOLN
30.0000 g | Freq: Two times a day (BID) | ORAL | Status: DC
  Administered 2023-12-31: 30 g via ORAL
  Filled 2023-12-31 (×3): qty 60

## 2023-12-31 NOTE — Progress Notes (Signed)
 Physical Therapy Treatment Patient Details Name: Hector [name] MRN: [id number] DOB: [birth year] Today's Date: 12/31/2023   History of Present Illness Hector [name] is a 58 y.o. 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  Patient also had 50 pounds of weight gain over the past few months.  Patient was given IV Lasix , consulted nephrology.  Changed to Lasix  infusion 11/1.  Patient had a worsening renal function after Lasix , permacath was placed 11/4, hemodialysis started.    PT Comments  Pt received in bed and was agreeable to PT treatment. Pt O2 was 100% on 3L so cannula was removed during session with stats remaining at 98% on RA and 93% on RA with standing activities. PT kept O2 removed at end of session to habituate pt to RA. Pt was able to mobilize to EOB with modA and cuing to reach across with contralateral UE to bedrail to pull himself up. Pt requires assistance with moving BLE off the bed. Pt instructed on safe hand placement for STS to RW and required minA 2+ for STS. Pt exhibits decreased standing tolerance and reports frequent dizziness with position change. Pt performed an additional STS and side stepped at bedside to the right in order to be closer to [facility] upon sitting. Pt would benefit from continued skilled therapy services to address his decline in strength and activity tolerance.   If plan is discharge home, recommend the following: Two people to help with walking and/or transfers;A lot of help with bathing/dressing/bathroom;Help with stairs or ramp for entrance   Can travel by private vehicle     No   Equipment Recommendations  Rolling walker (2 wheels);BSC/3in1    Recommendations for Other Services OT consult     Precautions / Restrictions Precautions Precautions: Fall Recall of Precautions/Restrictions: Intact Restrictions Weight Bearing Restrictions Per Provider Order: No     Mobility  Bed Mobility Overal bed mobility: Needs Assistance Bed Mobility: Supine to Sit     Supine to sit: Mod assist     General bed mobility comments: needs cues for hand placement and assist with trunkal elevation. Once seated at EOB, able to sit with supervision    Transfers Overall transfer level: Needs assistance Equipment used: Rolling walker (2 wheels) Transfers: Sit to/from Stand Sit to Stand: Min assist, +2 safety/equipment, From elevated surface           General transfer comment: Min A 2+ for STS from elevated bed to RW with cuing for safe hand placement; reports or dizziness with position change in STS    Ambulation/Gait Ambulation/Gait assistance: Min assist Gait Distance (Feet): 2 Feet Assistive device: Rolling walker (2 wheels) Gait Pattern/deviations: Narrow base of support       General Gait Details: pt performed side steps with MinA +2 and RW to R in order to move to [facility] before sitting back down in bed   Stairs             Wheelchair Mobility     Tilt Bed    Modified Rankin (Stroke Patients Only)       Balance Overall balance assessment: Needs assistance Sitting-balance support: No upper extremity supported, Feet supported Sitting balance-Leahy Scale: Fair Sitting balance -  Comments: sits at EOB with supervision   Standing balance support: Bilateral upper extremity supported Standing balance-Leahy Scale: Poor                              Communication Communication Communication: No apparent difficulties  Cognition Arousal: Alert Behavior During Therapy: WFL for tasks assessed/performed   PT - Cognitive impairments: No  apparent impairments                       PT - Cognition Comments: pleasant and agreeable to session Following commands: Intact      Cueing Cueing Techniques: Verbal cues  Exercises Other Exercises Other Exercises: edu on safe hand placement for STS with RW    General Comments General comments (skin integrity, edema, etc.): O2 at 98% on RA; 93% RA with exertion      Pertinent Vitals/Pain Pain Assessment Pain Assessment: Faces Faces Pain Scale: Hurts little more Pain Location: B knees & feet Pain Descriptors / Indicators: Aching Pain Intervention(s): Monitored during session    Home Living                          Prior Function            PT Goals (current goals can now be found in the care plan section) Acute Rehab PT Goals Patient Stated Goal: none stated PT Goal Formulation: With patient Time For Goal Achievement: 01/13/24 Potential to Achieve Goals: Good Progress towards PT goals: Progressing toward goals    Frequency    Min 2X/week      PT Plan      Co-evaluation              AM-PAC PT 6 Clicks Mobility   Outcome Measure  Help needed turning from your back to your side while in a flat bed without using bedrails?: A Little Help needed moving from lying on your back to sitting on the side of a flat bed without using bedrails?: A Little Help needed moving to and from a bed to a chair (including a wheelchair)?: A Lot Help needed standing up from a chair using your arms (e.g., wheelchair or bedside chair)?: A Little Help needed to walk in hospital room?: Total Help needed climbing 3-5 steps with a railing? : Total 6 Click Score: 13    End of Session Equipment Utilized During Treatment: Gait belt Activity Tolerance: Patient limited by fatigue Patient left: in bed;with call bell/phone within reach;with bed alarm set Nurse Communication: Mobility status PT Visit Diagnosis: Unsteadiness on feet (R26.81);Muscle weakness  (generalized) (M62.81);Difficulty in walking, not elsewhere classified (R26.2)     Time: 9094-9069 PT Time Calculation (min) (ACUTE ONLY): 25 min  Charges:                            Allena Bulls, SPT    Allena Bulls 12/31/2023, 9:53 AM

## 2023-12-31 NOTE — Evaluation (Signed)
 Occupational Therapy Evaluation Patient Details Name: Hector [name] MRN: [id number] DOB: 22-Jul-1965 Today's Date: 12/31/2023   History of Present Illness   Hector [name] is a 58 y.o. 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  Patient also had 50 pounds of weight gain over the past few months.  Patient was given IV Lasix , consulted nephrology.  Changed to Lasix  infusion 11/1.  Patient had a worsening renal function after Lasix , permacath was placed 11/4, hemodialysis started.     Clinical Impressions Patient presenting with decreased Ind in self care, balance, functional mobility/transfers, endurance, and safety awareness.  Patient lives with mother and brother at baseline and endorses being Ind with self care tasks at baseline. Pt needing mod A for bed mobility. Pt needing mod A to stand from standard bed height. Min-mod A for side steps towards the R to [facility] for BM with use of RW. Pt having difficulty following cues to move B LEs. Pt asking therapist to leave room and allow him to sit on [facility] for some time and call for assistance when done. RN is aware.  Patient will benefit from acute OT to increase overall independence in the areas of ADLs, functional mobility, and safety awareness in order to safely discharge.     If plan is discharge home, recommend the following:   A lot of help with walking and/or transfers;A lot of help with bathing/dressing/bathroom;Assistance with cooking/housework;Assistance with feeding;Direct supervision/assist for medications management;Direct supervision/assist for financial  management;Assist for transportation;Help with stairs or ramp for entrance     Functional Status Assessment   Patient has had a recent decline in their functional status and demonstrates the ability to make significant improvements in function in a reasonable and predictable amount of time.     Equipment Recommendations   None recommended by OT (defer to next venue of care)      Precautions/Restrictions   Precautions Precautions: Fall     Mobility Bed Mobility Overal bed mobility: Needs Assistance Bed Mobility: Supine to Sit     Supine to sit: Mod assist          Transfers Overall transfer level: Needs assistance Equipment used: Rolling walker (2 wheels) Transfers: Sit to/from Stand Sit to Stand: Mod assist                  Balance Overall balance assessment: Needs assistance Sitting-balance support: No upper extremity supported, Feet supported Sitting balance-Leahy Scale: Fair     Standing balance support: Bilateral upper extremity supported Standing balance-Leahy Scale: Poor                             ADL either performed or assessed with clinical judgement   ADL Overall ADL's : Needs assistance/impaired                         Toilet Transfer: Moderate assistance;Rolling walker (2 wheels);BSC/3in1                   Vision Patient Visual Report: No change from baseline  Pertinent Vitals/Pain Pain Assessment Pain Assessment: Faces Faces Pain Scale: Hurts little more Pain Location: B knees & feet Pain Descriptors / Indicators: Aching Pain Intervention(s): Monitored during session     Extremity/Trunk Assessment Upper Extremity Assessment Upper Extremity Assessment: Generalized weakness   Lower Extremity Assessment Lower Extremity Assessment: Generalized weakness       Communication Communication Communication: No apparent difficulties   Cognition Arousal: Alert Behavior During  Therapy: WFL for tasks assessed/performed Cognition: No apparent impairments                               Following commands: Intact       Cueing  General Comments   Cueing Techniques: Verbal cues              Home Living Family/patient expects to be discharged to:: Private residence Living Arrangements: Other relatives (lives with mom and brother) Available Help at Discharge: Family Type of Home: House Home Access: Ramped entrance     Home Layout: One level     Bathroom Shower/Tub: Tub/shower unit         Home Equipment: Agricultural Consultant (2 wheels)   Additional Comments: lives with mother and brother. Reports he has RW from when he had covid. Mother uses WC at baseline      Prior Functioning/Environment Prior Level of Function : Independent/Modified Independent             Mobility Comments: IND without AD household distances. Reports no recent falls ADLs Comments: reports IND with ADL management; brother drives him to MD appts, he does not go to grocery store    OT Problem List: Decreased strength;Decreased activity tolerance;Impaired balance (sitting and/or standing);Decreased safety awareness   OT Treatment/Interventions: Self-care/ADL training;Therapeutic exercise;Neuromuscular education;Balance training;Energy conservation;Therapeutic activities      OT Goals(Current goals can be found in the care plan section)   Acute Rehab OT Goals Patient Stated Goal: to get stronger OT Goal Formulation: With patient Time For Goal Achievement: 01/14/24 Potential to Achieve Goals: Fair ADL Goals Pt Will Perform Grooming: with supervision;standing Pt Will Perform Lower Body Dressing: sit to/from stand;with supervision Pt Will Transfer to Toilet: with supervision;ambulating Pt Will Perform Toileting - Clothing Manipulation and hygiene: with supervision;sit to/from stand   OT Frequency:  Min 2X/week       AM-PAC OT 6 Clicks Daily Activity      Outcome Measure Help from another person eating meals?: None Help from another person taking care of personal grooming?: A Little Help from another person toileting, which includes using toliet, bedpan, or urinal?: A Lot Help from another person bathing (including washing, rinsing, drying)?: A Lot Help from another person to put on and taking off regular upper body clothing?: A Little Help from another person to put on and taking off regular lower body clothing?: A Lot 6 Click Score: 16   End of Session Equipment Utilized During Treatment: Rolling walker (2 wheels) Nurse Communication: Mobility status  Activity Tolerance: Patient tolerated treatment well Patient left: Other (comment);with call bell/phone within reach [facility])  OT Visit Diagnosis: Unsteadiness on feet (R26.81);Muscle weakness (generalized) (M62.81)                Time: 8554-8541 OT Time Calculation (min): 13 min Charges:  OT General Charges $OT Visit: 1 Visit OT Evaluation $OT Eval Low Complexity: 1 Low  Izetta Claude, MS, OTR/L , CBIS ascom [phone]  12/31/23, 3:19 PM

## 2023-12-31 NOTE — Plan of Care (Signed)
   Problem: Nutritional: Goal: Maintenance of adequate nutrition will improve Outcome: Progressing   Problem: Education: Goal: Knowledge of General Education information will improve Description: Including pain rating scale, medication(s)/side effects and non-pharmacologic comfort measures Outcome: Progressing

## 2023-12-31 NOTE — TOC Progression Note (Addendum)
 Transition of Care [facility]) - Progression Note    Patient Details  Name: Hector [name] MRN: [id number] Date of Birth: March 10, 1965  Transition [facility]) CM/SW Contact  Lauraine JAYSON Carpen, LCSW Phone Number: 12/31/2023, 3:50 PM  Clinical Narrative:   No SNF bed offers from [facility] so far. Patient is only willing to go as far as [facility]. Extended search. CSW notified patient that Medicaid requires that he turn over his check minus around $30 for SNF. He stated he should be able to do this.  Expected Discharge Plan: Home/Self Care Barriers to Discharge: Continued Medical Work up               Expected Discharge Plan and Services     Post Acute Care Choice: NA Living arrangements for the past 2 months: Single Family Home                                       Social Drivers of Health (SDOH) Interventions SDOH Screenings   Food Insecurity: No Food Insecurity (12/16/2023)  Housing: Unknown (12/16/2023)  Transportation Needs: No Transportation Needs (12/16/2023)  Utilities: Not At Risk (12/16/2023)  Alcohol Screen: Low Risk  (01/19/2023)  Depression (PHQ2-9): Low Risk  (08/19/2021)  Financial Resource Strain: Low Risk  (01/19/2023)  Physical Activity: Inactive (01/04/2019)  Social Connections: Unknown (12/16/2023)  Stress: No Stress Concern Present (01/04/2019)  Tobacco Use: Medium Risk (12/23/2023)    Readmission Risk Interventions    12/27/2023    3:10 PM 04/27/2022   11:48 AM 02/24/2022    3:53 PM  Readmission Risk Prevention Plan  Transportation Screening Complete Complete Complete  Medication Review Oceanographer) Complete Complete Complete  PCP or Specialist appointment within 3-5 days of discharge Complete Complete   SW Recovery Care/Counseling Consult Complete Complete Complete  Palliative Care Screening Not Applicable Not Applicable Not Applicable  Skilled Nursing Facility Not Applicable Not Applicable Not Applicable

## 2023-12-31 NOTE — Progress Notes (Signed)
 [facility]  ROUNDING NOTE   Subjective:   Hector [name] is a 58 y.o. male with past medical conditions including hypertension, paroxysmal A-fib on Eliquis , CHF with a EF less than 20%, and chronic kidney disease stage IV. Patient presents to ED with shortness of breath and edema.  Patient currently admitted for Acute on chronic systolic CHF (congestive heart failure) (HCC) [I50.23] Acute on chronic congestive heart failure, unspecified heart failure type [facility]) [I50.9]  Patient known to our practice from previous admissions and is followed by [facility] nephrology outpatient.    Update Patient seen resting in bed States he's tired, worked with therapy earlier this morning Remains on room air Generalized edema greatly improved  Objective:  Vital signs in last 24 hours:  Temp:  [97.9 F (36.6 C)-98.8 F (37.1 C)] 97.9 F (36.6 C) (11/07 1215) Pulse Rate:  [93-102] 97 (11/07 1215) Resp:  [18-20] 20 (11/07 1215) BP: (96-110)/(66-91) 98/85 (11/07 1215) SpO2:  [95 %-100 %] 95 % (11/07 1215) Weight:  [133.4 kg] 133.4 kg (11/07 0500)  Weight change: 3 kg Filed Weights   12/29/23 1030 12/30/23 0519 12/31/23 0500  Weight: 130 kg 134.3 kg 133.4 kg    Intake/Output: I/O last 3 completed shifts: In: 660 [P.O.:660] Out: 3450 [Urine:1450; Other:2000]   Intake/Output this shift:  Total I/O In: 480 [P.O.:480] Out: -   Physical Exam: General: NAD  Head: Normocephalic, atraumatic. Moist oral mucosal membranes  Eyes: Anicteric  Lungs:  Clear to auscultation, normal effort  Heart: Regular rate and rhythm  Abdomen:  Soft, nontender, distended  Extremities: 1-2+ peripheral edema.  Neurologic: Awake and alert  Skin: Warm,dry, no rash  Access Rt internal jugular permcath  Basic Metabolic Panel: Recent Labs  Lab 12/26/23 0500 12/27/23 0351 12/28/23 0428 12/29/23 0547 12/30/23 0422 12/31/23 0439  NA 135 134* 134* 134* 132* 134*  K 3.8 4.1 3.8 3.8 3.7 3.7  CL 99 98  96* 94* 95* 96*  CO2 24 24 26 25 26 26   GLUCOSE 117* 110* 100* 88 145* 118*  BUN 61* 63* 65* 56* 48* 38*  CREATININE 4.94* 4.91* 5.07* 4.85* 5.03* 4.23*  CALCIUM  8.6* 8.8* 8.9 8.3* 8.2* 8.4*  MG 2.4  --  2.4  --   --   --   PHOS 4.7* 4.9* 5.3* 5.1* 4.8* 4.7*    Liver Function Tests: Recent Labs  Lab 12/27/23 0351 12/28/23 0428 12/29/23 0547 12/30/23 0422 12/31/23 0439  ALBUMIN  3.6 3.9 3.2* 3.1* 3.1*   No results for input(s): LIPASE, AMYLASE in the last 168 hours.  No results for input(s): AMMONIA in the last 168 hours.  CBC: Recent Labs  Lab 12/28/23 1315 12/29/23 0820  WBC 8.3 6.6  HGB 12.6* 12.2*  HCT 38.9* 37.1*  MCV 86.3 86.9  PLT 194 197    Cardiac Enzymes: No results for input(s): CKTOTAL, CKMB, CKMBINDEX, TROPONINI in the last 168 hours.  BNP: Invalid input(s): POCBNP  CBG: No results for input(s): GLUCAP in the last 168 hours.  Microbiology: Results for orders placed or performed during the hospital encounter of 12/23/23  Urine Culture (for pregnant, neutropenic or urologic patients or patients with an indwelling urinary catheter)     Status: Abnormal   Collection Time: 12/23/23  7:40 PM   Specimen: Urine, Catheterized  Result Value Ref Range Status   Specimen Description   Final    URINE, CATHETERIZED Performed at [facility], [address], [place], [place] [address]    Special Requests  Final    NONE Performed at [facility], [address] [place], [place] [address]    Culture (A)  Final    <10,000 COLONIES/mL INSIGNIFICANT GROWTH Performed at [facility], [address] [place], [place] [address]    Report Status 12/25/2023 FINAL  Final    Coagulation Studies: No results for input(s): LABPROT, INR in the last 72 hours.  Urinalysis: No results for input(s): COLORURINE, LABSPEC, PHURINE, GLUCOSEU, HGBUR, BILIRUBINUR, KETONESUR, PROTEINUR, UROBILINOGEN, NITRITE,  LEUKOCYTESUR in the last 72 hours.  Invalid input(s): APPERANCEUR     Imaging: No results found.     Medications:      (feeding supplement) PROSource Plus  30 mL Oral TID BM   allopurinol   100 mg Oral Daily   amiodarone   200 mg Oral Daily   apixaban   5 mg Oral BID   atorvastatin   80 mg Oral Daily   bisacodyl   10 mg Rectal Once   Chlorhexidine  Gluconate Cloth  6 each Topical Q0600   ciprofloxacin   2 drop Both Eyes Q4H while awake   feeding supplement (NEPRO CARB STEADY)  237 mL Oral QHS   lactulose   30 g Oral BID   levothyroxine   125 mcg Oral Q0600   lidocaine   1 patch Transdermal Q12H   lipase/protease/amylase  36,000 Units Oral TID AC   multivitamin  1 tablet Oral QHS   polyethylene glycol  17 g Oral Daily   acetaminophen  **OR** acetaminophen , alteplase, alteplase, alteplase, heparin , heparin , heparin , magnesium  hydroxide, methocarbamol , ondansetron  **OR** ondansetron  (ZOFRAN ) IV, mouth rinse, traZODone   Assessment/ Plan:  Hector [name] is a 58 y.o.  male with past medical conditions including hypertension, paroxysmal A-fib on Eliquis , CHF with a EF less than 20%, and chronic kidney disease stage IV.  Patient currently admitted for Acute on chronic systolic CHF (congestive heart failure) (HCC) [I50.23] Acute on chronic congestive heart failure, unspecified heart failure type (HCC) [I50.9]   Acute Kidney Injury on chronic kidney disease stage 4 with baseline creatinine 3.90 and GFR of 17 on 09/13/23. Most recent outpatient labs show creatinine 5.94 with GFR 10 on 12/07/23. Acute kidney injury likely has component of cardiorenal syndrome but also progression of kidney disease.   Chronic kidney disease is secondary to hypertension and CAD.  No recent IV contrast exposure.    Due to persistent kidney failure and multiple readmissions due to volume overload, patient was agreeable to proceed with renal replacement therapy. Appreciate vascular surgery placing Rt  internal jugular permcath.  Patient has received her initial dialysis treatments, tolerated well.  Attempted increase UF with treatment yesterday however patient became hypotensive.  Was able to remove 1 L of fluid.  Next dialysis treatment scheduled for Saturday. Appreciate dialysis navigator confirming outpatient clinic at [facility] on a TTS schedule, start date of 11/11.   Lab Results  Component Value Date   CREATININE 4.23 (H) 12/31/2023   CREATININE 5.03 (H) 12/30/2023   CREATININE 4.85 (H) 12/29/2023    Intake/Output Summary (Last 24 hours) at 12/31/2023 1352 Last data filed at 12/31/2023 1014 Gross per 24 hour  Intake 480 ml  Output 1850 ml  Net -1370 ml    2. Anemia of chronic kidney disease Lab Results  Component Value Date   HGB 12.2 (L) 12/29/2023    Hemoglobin acceptable.  No need for ESA at this time.  3. Secondary Hyperparathyroidism: with outpatient labs: PTH 1207, phosphorus 5.6, calcium  9.4 on 12/07/2023.   Lab Results  Component  Value Date   CALCIUM  8.4 (L) 12/31/2023   CAION 1.13 (L) 06/23/2023   CAION 1.10 (L) 06/23/2023   PHOS 4.7 (H) 12/31/2023    Will continue to monitor bone minerals.  Calcium  and phosphorus currently stable.  4. 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 Home regimen includes glipizide. Most recent hemoglobin A1c is 7.3 on 06/28/2023.   Diet controlled   LOS: 7 Hector [name] 11/7/20251:52 PM

## 2023-12-31 NOTE — Progress Notes (Signed)
 Mobility Specialist - Progress Note    12/31/23 1700  Mobility  Activity Pivoted/transferred to/from BSC;Stood at bedside  Level of Assistance Moderate assist, patient does 50-74%  Assistive Device Front wheel walker  Range of Motion/Exercises Active  Activity Response Tolerated well  Mobility visit 1 Mobility  Mobility Specialist Start Time (ACUTE ONLY) 1625  Mobility Specialist Stop Time (ACUTE ONLY) 1647  Mobility Specialist Time Calculation (min) (ACUTE ONLY) 22 min   Pt was on BSC on RA upon entry. Assisted with a transfer to bed. Pt is in bed with needs in reach.  Clem Rodes Mobility Specialist 12/31/23, 5:05 PM

## 2023-12-31 NOTE — Assessment & Plan Note (Addendum)
 Resolved with 2 large bowel movements.  Hold constipation meds today.

## 2023-12-31 NOTE — Progress Notes (Signed)
 Progress Note   Patient: Hector [name] FMW:[id number] DOB: [birth year] DOA: 12/23/2023     7 DOS: the patient was seen and examined on 12/31/2023   Brief hospital course: Hector [name] is a 58 y.o. 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  Patient also had 50 pounds of weight gain over the past few months. Patient was given IV Lasix , consulted nephrology. Changed to Lasix  infusion 11/1. Patient had a worsening renal function after Lasix , permacath was placed 11/4, hemodialysis started.  11/5.  Patient seen at the end of hemodialysis.  Only remove 300 mL secondary to low blood pressure. 11/6.  Patient seen before hemodialysis.  Physical therapy will recommend rehab.  TOC consulted.   Assessment and Plan: * Acute on chronic systolic CHF (congestive heart failure) (HCC) Lasix  drip stopped on 11/6.  Dialysis started on 11/4.  Hypotension limiting medications for heart failure.  Dialysis to help out with fluid removal.  Acute kidney injury superimposed on CKD Acute kidney injury on CKD stage IV.  Renal function worsened with diuretics and started on hemodialysis on 11/4.  Last creatinine 4.23  Paroxysmal atrial fibrillation (HCC) Continue amiodarone  and Eliquis .  Hypothyroidism Continue Synthroid   Gout Continue allopurinol .  Obesity (BMI 30-39.9) Current BMI 41.02.  BMI now in the morbid obesity range.  Acute lower UTI Ruled out.  Urine culture negative  Constipation Continue MiraLAX  twice daily.  Lactulose  twice daily.  Dose of Dulcolax suppository.  Hypoxia Yesterday had a pulse ox of 89% on 2 L of  oxygen.  Looks like able to come off oxygen and is 95% on room air today.  Weakness Physical therapy recommending rehab  Conjunctivitis Ciloxan  eyedrop  Pancreatic insufficiency Continue Creon .  Type 2 diabetes mellitus with chronic kidney disease, without long-term current use of insulin  (HCC) Hemoglobin A1c 7.4.  Continue to monitor with diet  Dyslipidemia Continue statin therapy.        Subjective: Patient still feels very weak.  Blood pressure on the lower side.  Started on dialysis.  Admitted with heart failure exacerbation and found to have worsening kidney function.  Physical Exam: Vitals:   12/31/23 0834 12/31/23 0852 12/31/23 0900 12/31/23 1215  BP:  96/66  98/85  Pulse:  98  97  Resp:  19 18 20   Temp:  98.2 F (36.8 C)  97.9 F (36.6 C)  TempSrc:  Oral  Oral  SpO2: 99% 99%  95%  Weight:      Height:       Physical Exam HENT:     Head: Normocephalic.     Mouth/Throat:     Pharynx: No oropharyngeal exudate.  Eyes:     General: Lids are normal.     Conjunctiva/sclera: Conjunctivae normal.  Cardiovascular:     Rate and Rhythm: Normal rate and regular rhythm.     Heart sounds: Normal heart sounds, S1 normal and S2 normal.  Pulmonary:     Breath sounds: No decreased breath sounds, wheezing, rhonchi or rales.  Abdominal:     Palpations: Abdomen is soft.     Tenderness: There is no abdominal tenderness.  Musculoskeletal:     Right lower leg: Swelling present.  Left lower leg: Swelling present.  Skin:    General: Skin is warm.     Comments: Chronic lower extremity skin discoloration  Neurological:     Mental Status: He is alert and oriented to person, place, and time.     Data Reviewed: Sodium 134, creatinine 4.23  Family Communication: Updated brother on the phone  Disposition: Status is: Inpatient Remains inpatient appropriate because: TOC looking into rehabs.  Trying to get a bowel movement going.  Looks like we were able to take off  oxygen during the day.  Planned Discharge Destination: Rehab    Time spent: 28 minutes  Author: Charlie Patterson, MD 12/31/2023 1:40 PM  For on call review [email or web address].

## 2024-01-01 DIAGNOSIS — E039 Hypothyroidism, unspecified: Secondary | ICD-10-CM | POA: Diagnosis not present

## 2024-01-01 DIAGNOSIS — N179 Acute kidney failure, unspecified: Secondary | ICD-10-CM | POA: Diagnosis not present

## 2024-01-01 DIAGNOSIS — I5023 Acute on chronic systolic (congestive) heart failure: Secondary | ICD-10-CM | POA: Diagnosis not present

## 2024-01-01 DIAGNOSIS — I480 Paroxysmal atrial fibrillation: Secondary | ICD-10-CM | POA: Diagnosis not present

## 2024-01-01 LAB — CBC
HCT: 34.5 % — ABNORMAL LOW (ref 39.0–52.0)
Hemoglobin: 11.2 g/dL — ABNORMAL LOW (ref 13.0–17.0)
MCH: 28.2 pg (ref 26.0–34.0)
MCHC: 32.5 g/dL (ref 30.0–36.0)
MCV: 86.9 fL (ref 80.0–100.0)
Platelets: 197 K/uL (ref 150–400)
RBC: 3.97 MIL/uL — ABNORMAL LOW (ref 4.22–5.81)
RDW: 17.7 % — ABNORMAL HIGH (ref 11.5–15.5)
WBC: 6.1 K/uL (ref 4.0–10.5)
nRBC: 0 % (ref 0.0–0.2)

## 2024-01-01 LAB — RENAL FUNCTION PANEL
Albumin: 3 g/dL — ABNORMAL LOW (ref 3.5–5.0)
Anion gap: 12 (ref 5–15)
BUN: 46 mg/dL — ABNORMAL HIGH (ref 6–20)
CO2: 24 mmol/L (ref 22–32)
Calcium: 8.3 mg/dL — ABNORMAL LOW (ref 8.9–10.3)
Chloride: 96 mmol/L — ABNORMAL LOW (ref 98–111)
Creatinine, Ser: 4.7 mg/dL — ABNORMAL HIGH (ref 0.61–1.24)
GFR, Estimated: 14 mL/min — ABNORMAL LOW (ref >60)
Glucose, Bld: 112 mg/dL — ABNORMAL HIGH (ref 70–99)
Phosphorus: 3.4 mg/dL (ref 2.5–4.6)
Potassium: 3.7 mmol/L (ref 3.5–5.1)
Sodium: 132 mmol/L — ABNORMAL LOW (ref 135–145)

## 2024-01-01 MED ORDER — MIDODRINE HCL 5 MG PO TABS
10.0000 mg | ORAL_TABLET | ORAL | Status: DC
  Administered 2024-01-01 – 2024-01-06 (×3): 10 mg via ORAL
  Filled 2024-01-01 (×3): qty 2

## 2024-01-01 MED ORDER — HEPARIN SODIUM (PORCINE) 1000 UNIT/ML IJ SOLN
INTRAMUSCULAR | Status: AC
  Filled 2024-01-01: qty 4

## 2024-01-01 MED ORDER — TORSEMIDE 20 MG PO TABS
100.0000 mg | ORAL_TABLET | ORAL | Status: DC
  Administered 2024-01-03 – 2024-01-05 (×2): 100 mg via ORAL
  Filled 2024-01-01 (×4): qty 5

## 2024-01-01 NOTE — Progress Notes (Signed)
 Progress Note   Patient: Hector [name] FMW:[id number] DOB: December 28, 1965 DOA: 12/23/2023     8 DOS: the patient was seen and examined on 01/01/2024   Brief hospital course: Hector [name] is a 58 y.o. 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  Patient also had 50 pounds of weight gain over the past few months. Patient was given IV Lasix , consulted nephrology. Changed to Lasix  infusion 11/1. Patient had a worsening renal function after Lasix , permacath was placed 11/4, hemodialysis started.  11/5.  Patient seen at the end of hemodialysis.  Only remove 300 mL secondary to low blood pressure. 11/6.  Patient seen before hemodialysis.  Physical therapy will recommend rehab.  TOC consulted. 11/7.  Patient is constipated and ordered lactulose  and MiraLAX  for constipation. 11/8.  Case discussed with nephrology and will give midodrine prior to dialysis.   Assessment and Plan: * Acute on chronic systolic CHF (congestive heart failure) (HCC) Lasix  drip stopped on 11/6.  Dialysis started on 11/4.  Hypotension limiting medications for heart failure.  Dialysis to help out with fluid removal.  Midodrine prior to dialysis to help out with fluid removal.  Torsemide  on nondialysis days.  Acute kidney injury superimposed on CKD Acute kidney injury on CKD stage IV.  Renal function worsened with diuretics and started on hemodialysis on 11/4.  Last creatinine 4.7  Paroxysmal atrial fibrillation (HCC) Continue amiodarone  and Eliquis .  Hypothyroidism Continue Synthroid   Gout Continue allopurinol .  Obesity (BMI 30-39.9) Current BMI  39.85 with current height and weight in computer  Acute lower UTI Ruled out.  Urine culture negative  Constipation Resolved with 2 large bowel movements.  Hold constipation meds today.  Hypoxia Previously had a pulse ox of 89% on 2 L of oxygen.  Currently off oxygen.  Weakness Physical therapy recommending rehab  Conjunctivitis Ciloxan  eyedrop  Pancreatic insufficiency Continue Creon .  Type 2 diabetes mellitus with chronic kidney disease, without long-term current use of insulin  (HCC) Hemoglobin A1c 7.4.  Continue to monitor with diet  Dyslipidemia Continue statin therapy.        Subjective: Patient had a bowel movement.  Still feels a little lightheaded when moving around.  Feels better with regards to his breathing.  Admitted 8 days with heart failure exacerbation, required starting on dialysis  Physical Exam: Vitals:   01/01/24 1300 01/01/24 1330 01/01/24 1400 01/01/24 1430  BP: (!) 109/98 (!) 112/101 115/84 107/88  Pulse: 72 92 84 80  Resp: (!) 22 15 (!) 24 16  Temp:      TempSrc:      SpO2: 95% 98% 98% 98%  Weight:      Height:       Physical Exam HENT:     Head: Normocephalic.     Mouth/Throat:     Pharynx: No oropharyngeal exudate.  Eyes:     General: Lids are normal.     Conjunctiva/sclera: Conjunctivae normal.  Cardiovascular:     Rate and Rhythm: Normal rate and regular rhythm.     Heart sounds: Normal heart sounds, S1 normal and S2 normal.  Pulmonary:     Breath sounds: No decreased breath sounds, wheezing, rhonchi or rales.  Abdominal:  Palpations: Abdomen is soft.     Tenderness: There is no abdominal tenderness.  Musculoskeletal:     Right lower leg: Swelling present.     Left lower leg: Swelling present.  Skin:    General: Skin is warm.     Comments: Chronic lower extremity skin discoloration  Neurological:     Mental Status: He is alert and oriented to person, place, and time.     Data Reviewed: Creatinine 4.7, hemoglobin  11.2  Disposition: Status is: Inpatient Remains inpatient appropriate because: TOC looking into rehab beds.  Planned Discharge Destination: rehab    Time spent: 28 minutes  Author: Charlie Patterson, MD 01/01/2024 2:58 PM  For on call review [email or web address].

## 2024-01-01 NOTE — Progress Notes (Addendum)
 [facility]  ROUNDING NOTE   Subjective:   Hector [name] is a 58 y.o. male with past medical conditions including hypertension, paroxysmal A-fib on Eliquis , CHF with a EF less than 20%, and chronic kidney disease stage IV. Patient presents to ED with shortness of breath and edema.  Patient currently admitted for Acute on chronic systolic CHF (congestive heart failure) (HCC) [I50.23] Acute on chronic congestive heart failure, unspecified heart failure type [facility]) [I50.9]  Patient known to our practice from previous admissions and is followed by [facility] nephrology outpatient.    Update Patient resting in bed Alert Lower extremity edema has improved.    Objective:  Vital signs in last 24 hours:  Temp:  [97.9 F (36.6 C)-98.8 F (37.1 C)] 98.3 F (36.8 C) (11/08 0731) Pulse Rate:  [86-97] 86 (11/08 0731) Resp:  [18-20] 20 (11/08 0731) BP: (96-129)/(74-85) 96/74 (11/08 0731) SpO2:  [91 %-97 %] 91 % (11/08 0731) Weight:  [133.9 kg] 133.9 kg (11/08 0500)  Weight change: 0.5 kg Filed Weights   12/30/23 0519 12/31/23 0500 01/01/24 0500  Weight: 134.3 kg 133.4 kg 133.9 kg    Intake/Output: I/O last 3 completed shifts: In: 840 [P.O.:840] Out: 1100 [Urine:1100]   Intake/Output this shift:  Total I/O In: -  Out: 200 [Urine:200]  Physical Exam: General: NAD  Head: Normocephalic, atraumatic. Moist oral mucosal membranes  Eyes: Anicteric  Lungs:  Clear to auscultation, normal effort  Heart: Regular rate and rhythm  Abdomen:  Soft, nontender, distended  Extremities: 1-2+ peripheral edema.  Neurologic: Awake and alert  Skin: Warm,dry, no rash  Access Rt internal jugular permcath  Basic Metabolic Panel: Recent Labs  Lab 12/26/23 0500 12/27/23 0351 12/28/23 0428 12/29/23 0547 12/30/23 0422 12/31/23 0439 01/01/24 0527  NA 135   < > 134* 134* 132* 134* 132*  K 3.8   < > 3.8 3.8 3.7 3.7 3.7  CL 99   < > 96* 94* 95* 96* 96*  CO2 24   < > 26 25 26 26 24    GLUCOSE 117*   < > 100* 88 145* 118* 112*  BUN 61*   < > 65* 56* 48* 38* 46*  CREATININE 4.94*   < > 5.07* 4.85* 5.03* 4.23* 4.70*  CALCIUM  8.6*   < > 8.9 8.3* 8.2* 8.4* 8.3*  MG 2.4  --  2.4  --   --   --   --   PHOS 4.7*   < > 5.3* 5.1* 4.8* 4.7* 3.4   < > = values in this interval not displayed.    Liver Function Tests: Recent Labs  Lab 12/28/23 0428 12/29/23 0547 12/30/23 0422 12/31/23 0439 01/01/24 0527  ALBUMIN  3.9 3.2* 3.1* 3.1* 3.0*   No results for input(s): LIPASE, AMYLASE in the last 168 hours.  No results for input(s): AMMONIA in the last 168 hours.  CBC: Recent Labs  Lab 12/28/23 1315 12/29/23 0820  WBC 8.3 6.6  HGB 12.6* 12.2*  HCT 38.9* 37.1*  MCV 86.3 86.9  PLT 194 197    Cardiac Enzymes: No results for input(s): CKTOTAL, CKMB, CKMBINDEX, TROPONINI in the last 168 hours.  BNP: Invalid input(s): POCBNP  CBG: No results for input(s): GLUCAP in the last 168 hours.  Microbiology: Results for orders placed or performed during the hospital encounter of 12/23/23  Urine Culture (for pregnant, neutropenic or urologic patients or patients with an indwelling urinary catheter)     Status: Abnormal   Collection Time: 12/23/23  7:40  PM   Specimen: Urine, Catheterized  Result Value Ref Range Status   Specimen Description   Final    URINE, CATHETERIZED Performed at [facility], [address], [place], [place] [address]    Special Requests   Final    NONE Performed at [facility], [address] [place], [place] [address]    Culture (A)  Final    <10,000 COLONIES/mL INSIGNIFICANT GROWTH Performed at [facility], [address], [place], [place] [address]    Report Status 12/25/2023 FINAL  Final    Coagulation Studies: No results for input(s): LABPROT, INR in the last 72 hours.  Urinalysis: No results for input(s): COLORURINE, LABSPEC, PHURINE, GLUCOSEU, HGBUR, BILIRUBINUR,  KETONESUR, PROTEINUR, UROBILINOGEN, NITRITE, LEUKOCYTESUR in the last 72 hours.  Invalid input(s): APPERANCEUR     Imaging: No results found.     Medications:      (feeding supplement) PROSource Plus  30 mL Oral TID BM   allopurinol   100 mg Oral Daily   amiodarone   200 mg Oral Daily   apixaban   5 mg Oral BID   atorvastatin   80 mg Oral Daily   Chlorhexidine  Gluconate Cloth  6 each Topical Q0600   ciprofloxacin   2 drop Both Eyes Q4H while awake   feeding supplement (NEPRO CARB STEADY)  237 mL Oral QHS   levothyroxine   125 mcg Oral Q0600   lidocaine   1 patch Transdermal Q12H   lipase/protease/amylase  36,000 Units Oral TID AC   midodrine  10 mg Oral Q T,Th,Sa-HD   multivitamin  1 tablet Oral QHS   [START ON 01/03/2024] torsemide   100 mg Oral Q M,W,F   acetaminophen  **OR** acetaminophen , alteplase, alteplase, alteplase, heparin , heparin , heparin , magnesium  hydroxide, methocarbamol , ondansetron  **OR** ondansetron  (ZOFRAN ) IV, mouth rinse, traZODone   Assessment/ Plan:  [name] is a 58 y.o.  male with past medical conditions including hypertension, paroxysmal A-fib on Eliquis , CHF with a EF less than 20%, and chronic kidney disease stage IV.  Patient currently admitted for Acute on chronic systolic CHF (congestive heart failure) (HCC) [I50.23] Acute on chronic congestive heart failure, unspecified heart failure type (HCC) [I50.9]   Acute Kidney Injury on chronic kidney disease stage 4 with baseline creatinine 3.90 and GFR of 17 on 09/13/23. Most recent outpatient labs show creatinine 5.94 with GFR 10 on 12/07/23. Acute kidney injury likely has component of cardiorenal syndrome but also progression of kidney disease.   Chronic kidney disease is secondary to hypertension and CAD.  No recent IV contrast exposure.    Due to persistent kidney failure and multiple readmissions due to volume overload, patient was agreeable to proceed with renal replacement  therapy. Appreciate vascular surgery placing Rt internal jugular permcath.  Scheduled for dialysis later today. Will order Midodrine 10mg  with dialysis. Will also restart Torsemide  100mg  on nondialysis days.  Appreciate dialysis navigator confirming outpatient clinic at [facility] on a TTS schedule, start date of 11/11.   Lab Results  Component Value Date   CREATININE 4.70 (H) 01/01/2024   CREATININE 4.23 (H) 12/31/2023   CREATININE 5.03 (H) 12/30/2023    Intake/Output Summary (Last 24 hours) at 01/01/2024 1042 Last data filed at 01/01/2024 1029 Gross per 24 hour  Intake 360 ml  Output 900 ml  Net -540 ml    2. Anemia of chronic kidney disease Lab Results  Component Value Date   HGB 12.2 (L) 12/29/2023    No need for ESA at this time.  3. Secondary Hyperparathyroidism: with outpatient labs: PTH 1207, phosphorus 5.6, calcium  9.4 on 12/07/2023.   Lab Results  Component Value Date   CALCIUM  8.3 (L) 01/01/2024   CAION 1.13 (L) 06/23/2023   CAION 1.10 (L) 06/23/2023   PHOS 3.4 01/01/2024    Calcium  and phosphorus acceptable  4. 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 Home regimen includes glipizide. Most recent hemoglobin A1c is 7.3 on 06/28/2023.   Diet controlled   LOS: 8 Oland Arquette 11/8/202510:42 AM

## 2024-01-01 NOTE — Plan of Care (Signed)
  Problem: Education: Goal: Ability to describe self-care measures that may prevent or decrease complications (Diabetes Survival Skills Education) will improve Outcome: Progressing Goal: Individualized Educational Video(s) Outcome: Progressing   Problem: Coping: Goal: Ability to adjust to condition or change in health will improve Outcome: Progressing   Problem: Fluid Volume: Goal: Ability to maintain a balanced intake and output will improve Outcome: Progressing   Problem: Health Behavior/Discharge Planning: Goal: Ability to identify and utilize available resources and services will improve Outcome: Progressing Goal: Ability to manage health-related needs will improve Outcome: Progressing   Problem: Metabolic: Goal: Ability to maintain appropriate glucose levels will improve Outcome: Progressing   Problem: Nutritional: Goal: Maintenance of adequate nutrition will improve Outcome: Progressing Goal: Progress toward achieving an optimal weight will improve Outcome: Progressing   Problem: Skin Integrity: Goal: Risk for impaired skin integrity will decrease Outcome: Progressing   Problem: Tissue Perfusion: Goal: Adequacy of tissue perfusion will improve Outcome: Progressing   Problem: Education: Goal: Knowledge of General Education information will improve Description: Including pain rating scale, medication(s)/side effects and non-pharmacologic comfort measures Outcome: Progressing   Problem: Health Behavior/Discharge Planning: Goal: Ability to manage health-related needs will improve Outcome: Progressing   Problem: Clinical Measurements: Goal: Ability to maintain clinical measurements within normal limits will improve Outcome: Progressing Goal: Will remain free from infection Outcome: Progressing Goal: Diagnostic test results will improve Outcome: Progressing Goal: Respiratory complications will improve Outcome: Progressing Goal: Cardiovascular complication will  be avoided Outcome: Progressing   Problem: Activity: Goal: Risk for activity intolerance will decrease Outcome: Progressing   Problem: Nutrition: Goal: Adequate nutrition will be maintained Outcome: Progressing   Problem: Coping: Goal: Level of anxiety will decrease Outcome: Progressing   Problem: Elimination: Goal: Will not experience complications related to bowel motility Outcome: Progressing Goal: Will not experience complications related to urinary retention Outcome: Progressing   Problem: Pain Managment: Goal: General experience of comfort will improve and/or be controlled Outcome: Progressing   Problem: Safety: Goal: Ability to remain free from injury will improve Outcome: Progressing   Problem: Skin Integrity: Goal: Risk for impaired skin integrity will decrease Outcome: Progressing   Problem: Education: Goal: Ability to demonstrate management of disease process will improve Outcome: Progressing Goal: Ability to verbalize understanding of medication therapies will improve Outcome: Progressing Goal: Individualized Educational Video(s) Outcome: Progressing   Problem: Activity: Goal: Capacity to carry out activities will improve Outcome: Progressing   Problem: Cardiac: Goal: Ability to achieve and maintain adequate cardiopulmonary perfusion will improve Outcome: Progressing   Problem: Education: Goal: Knowledge of disease and its progression will improve Outcome: Progressing Goal: Individualized Educational Video(s) Outcome: Progressing   Problem: Fluid Volume: Goal: Compliance with measures to maintain balanced fluid volume will improve Outcome: Progressing   Problem: Health Behavior/Discharge Planning: Goal: Ability to manage health-related needs will improve Outcome: Progressing   Problem: Nutritional: Goal: Ability to make healthy dietary choices will improve Outcome: Progressing   Problem: Clinical Measurements: Goal: Complications related  to the disease process, condition or treatment will be avoided or minimized Outcome: Progressing

## 2024-01-01 NOTE — Plan of Care (Signed)
   Problem: Nutritional: Goal: Maintenance of adequate nutrition will improve Outcome: Progressing   Problem: Education: Goal: Knowledge of General Education information will improve Description: Including pain rating scale, medication(s)/side effects and non-pharmacologic comfort measures Outcome: Progressing

## 2024-01-01 NOTE — Progress Notes (Signed)
 Hemodialysis Note:  Received patient in bed to unit. Alert and oriented. Informed consent singed and in chart.  Treatment initiated: 1200 Treatment completed: 1553  Access used: Right internal jugular catheter Access issues: None  Patient tolerated well. Transported back to room, alert without acute distress. Report given to patient's RN.  Total UF removed: 1.5 Liter Medications given: None  Post HD weight: 128.1 kg  [facility]

## 2024-01-02 DIAGNOSIS — I5023 Acute on chronic systolic (congestive) heart failure: Secondary | ICD-10-CM | POA: Diagnosis not present

## 2024-01-02 DIAGNOSIS — I480 Paroxysmal atrial fibrillation: Secondary | ICD-10-CM | POA: Diagnosis not present

## 2024-01-02 DIAGNOSIS — E039 Hypothyroidism, unspecified: Secondary | ICD-10-CM | POA: Diagnosis not present

## 2024-01-02 DIAGNOSIS — N179 Acute kidney failure, unspecified: Secondary | ICD-10-CM | POA: Diagnosis not present

## 2024-01-02 LAB — RENAL FUNCTION PANEL
Albumin: 3.1 g/dL — ABNORMAL LOW (ref 3.5–5.0)
Anion gap: 10 (ref 5–15)
BUN: 36 mg/dL — ABNORMAL HIGH (ref 6–20)
CO2: 26 mmol/L (ref 22–32)
Calcium: 8.4 mg/dL — ABNORMAL LOW (ref 8.9–10.3)
Chloride: 96 mmol/L — ABNORMAL LOW (ref 98–111)
Creatinine, Ser: 3.81 mg/dL — ABNORMAL HIGH (ref 0.61–1.24)
GFR, Estimated: 17 mL/min — ABNORMAL LOW (ref >60)
Glucose, Bld: 116 mg/dL — ABNORMAL HIGH (ref 70–99)
Phosphorus: 3.8 mg/dL (ref 2.5–4.6)
Potassium: 3.7 mmol/L (ref 3.5–5.1)
Sodium: 132 mmol/L — ABNORMAL LOW (ref 135–145)

## 2024-01-02 MED ORDER — POLYETHYLENE GLYCOL 3350 17 G PO PACK
17.0000 g | PACK | Freq: Every day | ORAL | Status: DC
  Administered 2024-01-06 – 2024-01-07 (×2): 17 g via ORAL
  Filled 2024-01-02 (×5): qty 1

## 2024-01-02 NOTE — Plan of Care (Signed)
 This patient remains on AR-2A as of time of writing. Patient's admission profile completed overnight by this RN. Fall risk precautions initiated overnight per protocol; including adding a yellow fall risk armband onto the patient. The patient expressed displeasure at the idea of wearing a falls risk arm band, stating I've been here all week and haven't had to wear one. The patient is provided with gentle education regarding the importance of falls prevention. Plan for CPAP overnight tonight; call placed to RT to request CPAP machine set-up (no equipment at bedside). The patient refused offers for ambulation overnight from nursing staff, stating You're not therapy, get the hell out of my room. I'll walk when I want to. Potential discharge date tomorrow (01/03/24).   Problem: Activity: Goal: Ability to tolerate increased activity will improve Outcome: Progressing   Problem: Cardiac: Goal: Ability to achieve and maintain adequate cardiopulmonary perfusion will improve Outcome: Progressing   Problem: Health Behavior/Discharge Planning: Goal: Ability to safely manage health-related needs after discharge will improve Outcome: Progressing   Problem: Education: Goal: Knowledge of disease or condition will improve Outcome: Progressing Goal: Knowledge of the prescribed therapeutic regimen will improve Outcome: Progressing Goal: Individualized Educational Video(s) Outcome: Progressing   Problem: Activity: Goal: Ability to tolerate increased activity will improve Outcome: Progressing Goal: Will verbalize the importance of balancing activity with adequate rest periods Outcome: Progressing   Problem: Respiratory: Goal: Ability to maintain a clear airway will improve Outcome: Progressing Goal: Levels of oxygenation will improve Outcome: Progressing Goal: Ability to maintain adequate ventilation will improve Outcome: Progressing

## 2024-01-02 NOTE — Progress Notes (Signed)
 [facility]  ROUNDING NOTE   Subjective:   Hector [name] is a 58 y.o. male with past medical conditions including hypertension, paroxysmal A-fib on Eliquis , CHF with a EF less than 20%, and chronic kidney disease stage IV. Patient presents to ED with shortness of breath and edema.  Patient currently admitted for Acute on chronic systolic CHF (congestive heart failure) (HCC) [I50.23] Acute on chronic congestive heart failure, unspecified heart failure type [facility]) [I50.9]  Patient known to our practice from previous admissions and is followed by [facility] nephrology outpatient.    Update Patient resting quietly Alert Continues to have weakness and fatigue   Objective:  Vital signs in last 24 hours:  Temp:  [97.8 F (36.6 C)-98.6 F (37 C)] 98.2 F (36.8 C) (11/09 0755) Pulse Rate:  [72-96] 84 (11/09 0755) Resp:  [15-27] 20 (11/09 0755) BP: (85-242)/(35-224) 110/68 (11/09 0755) SpO2:  [93 %-99 %] 98 % (11/09 0755) Weight:  [128.1 kg-132.9 kg] 129.5 kg (11/09 1000)  Weight change: -4.3 kg Filed Weights   01/01/24 1553 01/02/24 0500 01/02/24 1000  Weight: 128.1 kg 132.9 kg 129.5 kg    Intake/Output: I/O last 3 completed shifts: In: 360 [P.O.:360] Out: 2250 [Urine:750; Other:1500]   Intake/Output this shift:  No intake/output data recorded.  Physical Exam: General: NAD  Head: Normocephalic, atraumatic. Moist oral mucosal membranes  Eyes: Anicteric  Lungs:  Clear to auscultation, normal effort  Heart: Regular rate and rhythm  Abdomen:  Soft, nontender, distended  Extremities: 1+ peripheral edema.  Neurologic: Awake and alert  Skin: Warm,dry, no rash  Access Rt internal jugular permcath  Basic Metabolic Panel: Recent Labs  Lab 12/28/23 0428 12/29/23 0547 12/30/23 0422 12/31/23 0439 01/01/24 0527 01/02/24 0514  NA 134* 134* 132* 134* 132* 132*  K 3.8 3.8 3.7 3.7 3.7 3.7  CL 96* 94* 95* 96* 96* 96*  CO2 26 25 26 26 24 26   GLUCOSE 100* 88 145* 118*  112* 116*  BUN 65* 56* 48* 38* 46* 36*  CREATININE 5.07* 4.85* 5.03* 4.23* 4.70* 3.81*  CALCIUM  8.9 8.3* 8.2* 8.4* 8.3* 8.4*  MG 2.4  --   --   --   --   --   PHOS 5.3* 5.1* 4.8* 4.7* 3.4 3.8    Liver Function Tests: Recent Labs  Lab 12/29/23 0547 12/30/23 0422 12/31/23 0439 01/01/24 0527 01/02/24 0514  ALBUMIN  3.2* 3.1* 3.1* 3.0* 3.1*   No results for input(s): LIPASE, AMYLASE in the last 168 hours.  No results for input(s): AMMONIA in the last 168 hours.  CBC: Recent Labs  Lab 12/28/23 1315 12/29/23 0820 01/01/24 1205  WBC 8.3 6.6 6.1  HGB 12.6* 12.2* 11.2*  HCT 38.9* 37.1* 34.5*  MCV 86.3 86.9 86.9  PLT 194 197 197    Cardiac Enzymes: No results for input(s): CKTOTAL, CKMB, CKMBINDEX, TROPONINI in the last 168 hours.  BNP: Invalid input(s): POCBNP  CBG: No results for input(s): GLUCAP in the last 168 hours.  Microbiology: Results for orders placed or performed during the hospital encounter of 12/23/23  Urine Culture (for pregnant, neutropenic or urologic patients or patients with an indwelling urinary catheter)     Status: Abnormal   Collection Time: 12/23/23  7:40 PM   Specimen: Urine, Catheterized  Result Value Ref Range Status   Specimen Description   Final    URINE, CATHETERIZED Performed at [facility], [address] [place], [place] [address]    Special Requests   Final  NONE Performed at [facility], [address] [place], [place] [address]    Culture (A)  Final    <10,000 COLONIES/mL INSIGNIFICANT GROWTH Performed at [facility], [address] [place], [place] [address]    Report Status 12/25/2023 FINAL  Final    Coagulation Studies: No results for input(s): LABPROT, INR in the last 72 hours.  Urinalysis: No results for input(s): COLORURINE, LABSPEC, PHURINE, GLUCOSEU, HGBUR, BILIRUBINUR, KETONESUR, PROTEINUR, UROBILINOGEN, NITRITE, LEUKOCYTESUR in the last  72 hours.  Invalid input(s): APPERANCEUR     Imaging: No results found.     Medications:      (feeding supplement) PROSource Plus  30 mL Oral TID BM   allopurinol   100 mg Oral Daily   amiodarone   200 mg Oral Daily   apixaban   5 mg Oral BID   atorvastatin   80 mg Oral Daily   Chlorhexidine  Gluconate Cloth  6 each Topical Q0600   ciprofloxacin   2 drop Both Eyes Q4H while awake   feeding supplement (NEPRO CARB STEADY)  237 mL Oral QHS   levothyroxine   125 mcg Oral Q0600   lidocaine   1 patch Transdermal Q12H   lipase/protease/amylase  36,000 Units Oral TID AC   midodrine  10 mg Oral Q T,Th,Sa-HD   multivitamin  1 tablet Oral QHS   polyethylene glycol  17 g Oral Daily   [START ON 01/03/2024] torsemide   100 mg Oral Q M,W,F   acetaminophen  **OR** acetaminophen , magnesium  hydroxide, methocarbamol , ondansetron  **OR** ondansetron  (ZOFRAN ) IV, mouth rinse, traZODone   Assessment/ Plan:  Hector [name] is a 58 y.o.  male with past medical conditions including hypertension, paroxysmal A-fib on Eliquis , CHF with a EF less than 20%, and chronic kidney disease stage IV.  Patient currently admitted for Acute on chronic systolic CHF (congestive heart failure) (HCC) [I50.23] Acute on chronic congestive heart failure, unspecified heart failure type (HCC) [I50.9]   Acute Kidney Injury on chronic kidney disease stage 4 with baseline creatinine 3.90 and GFR of 17 on 09/13/23. Most recent outpatient labs show creatinine 5.94 with GFR 10 on 12/07/23. Acute kidney injury likely has component of cardiorenal syndrome but also progression of kidney disease.   Chronic kidney disease is secondary to hypertension and CAD.  No recent IV contrast exposure.    Due to persistent kidney failure and multiple readmissions due to volume overload, patient was agreeable to proceed with renal replacement therapy. Appreciate vascular surgery placing Rt internal jugular permcath.  Received dialysis yesterday  with UF 1.5L achieved.  Received Midodrine 10mg  with dialysis. Restarted Torsemide  100mg  on nondialysis days.  Appreciate dialysis navigator confirming outpatient clinic at [facility] on a TTS schedule, start date of 11/11. Pending rehab placement   Lab Results  Component Value Date   CREATININE 3.81 (H) 01/02/2024   CREATININE 4.70 (H) 01/01/2024   CREATININE 4.23 (H) 12/31/2023    Intake/Output Summary (Last 24 hours) at 01/02/2024 1057 Last data filed at 01/01/2024 1632 Gross per 24 hour  Intake 240 ml  Output 1650 ml  Net -1410 ml    2. Anemia of chronic kidney disease Lab Results  Component Value Date   HGB 11.2 (L) 01/01/2024   Hgb stable No need for ESA at this time.  3. Secondary Hyperparathyroidism: with outpatient labs: PTH 1207, phosphorus 5.6, calcium  9.4 on 12/07/2023.   Lab Results  Component Value Date   CALCIUM  8.4 (L) 01/02/2024   CAION 1.13 (L) 06/23/2023   CAION 1.10 (L) 06/23/2023  PHOS 3.8 01/02/2024    Bone minerals within optimal range  4. 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 Home regimen includes glipizide. Most recent hemoglobin A1c is 7.3 on 06/28/2023.   Diet controlled   LOS: 9 Rosette Bellavance 11/9/202510:57 AM

## 2024-01-02 NOTE — Progress Notes (Signed)
 PT Cancellation Note  Patient Details Name: Hector [name] MRN: [id number] DOB: 1965-12-19   Cancelled Treatment:    Reason Eval/Treat Not Completed: Other (comment)  Pt in bed.  Stated he was up earlier.  Denied dizziness today.  Declined further activity at this time.    Lauraine Gills 01/02/2024, 1:58 PM

## 2024-01-02 NOTE — Plan of Care (Signed)
  Problem: Coping: Goal: Ability to adjust to condition or change in health will improve Outcome: Progressing   Problem: Coping: Goal: Level of anxiety will decrease Outcome: Progressing   Problem: Fluid Volume: Goal: Ability to maintain a balanced intake and output will improve Outcome: Progressing   Problem: Skin Integrity: Goal: Risk for impaired skin integrity will decrease Outcome: Progressing   Problem: Tissue Perfusion: Goal: Adequacy of tissue perfusion will improve Outcome: Progressing

## 2024-01-02 NOTE — Progress Notes (Signed)
 Progress Note   Patient: Hector [name] FMW:[id number] DOB: 1965/07/20 DOA: 12/23/2023     9 DOS: the patient was seen and examined on 01/02/2024   Brief hospital course: Hector [name] is a 58 y.o. 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  Patient also had 50 pounds of weight gain over the past few months. Patient was given IV Lasix , consulted nephrology. Changed to Lasix  infusion 11/1. Patient had a worsening renal function after Lasix , permacath was placed 11/4, hemodialysis started.  11/5.  Patient seen at the end of hemodialysis.  Only remove 300 mL secondary to low blood pressure. 11/6.  Patient seen before hemodialysis.  Physical therapy will recommend rehab.  TOC consulted. 11/7.  Patient is constipated and ordered lactulose  and MiraLAX  for constipation. 11/8.  Case discussed with nephrology and will give midodrine prior to dialysis.   Assessment and Plan: * Acute on chronic systolic CHF (congestive heart failure) (HCC) Lasix  drip stopped on 11/6.  Dialysis started on 11/4.  Hypotension limiting medications for heart failure.  Dialysis to help out with fluid removal.  Midodrine prior to dialysis to help out with fluid removal.  Yesterday able to UF 1.5 L.  Torsemide  on nondialysis days.  Acute kidney injury superimposed on CKD Acute kidney injury on CKD stage IV.  Renal function worsened with diuretics and started on hemodialysis on 11/4.  Last creatinine 3.81.  Paroxysmal atrial fibrillation (HCC) Continue amiodarone  and Eliquis .  Hypothyroidism Continue Synthroid   Gout Continue  allopurinol .  Obesity (BMI 30-39.9) Current BMI 39.82 with current height and weight in computer  Acute lower UTI Ruled out.  Urine culture negative  Constipation Resolved with 2 large bowel movements.  Hold constipation meds today.  Hypoxia Previously had a pulse ox of 89% on 2 L of oxygen.  Asked nursing staff to take off oxygen again.  CPAP at night for sleep apnea.  Weakness Physical therapy recommending rehab  Conjunctivitis Ciloxan  eyedrop  Pancreatic insufficiency Continue Creon .  Type 2 diabetes mellitus with chronic kidney disease, without long-term current use of insulin  (HCC) Hemoglobin A1c 7.4.  Continue to monitor with diet  Dyslipidemia Continue statin therapy.        Subjective: Patient still feeling weak.  Admitted 9 days ago with CHF.  Started on dialysis with worsening kidney function Placed on Lasix  drip.  Physical Exam: Vitals:   01/02/24 0357 01/02/24 0500 01/02/24 0755 01/02/24 1000  BP: 98/75  110/68   Pulse: 86  84   Resp: 20  20   Temp: 98.2 F (36.8 C)  98.2 F (36.8 C)   TempSrc:   Oral   SpO2: 97%  98%   Weight:  132.9 kg  129.5 kg  Height:       Physical Exam HENT:     Head: Normocephalic.     Mouth/Throat:     Pharynx: No oropharyngeal exudate.  Eyes:     General: Lids are normal.     Conjunctiva/sclera: Conjunctivae normal.  Cardiovascular:     Rate and Rhythm: Normal rate and regular rhythm.     Heart sounds: Normal heart sounds, S1 normal and S2 normal.  Pulmonary:     Breath sounds: Examination  of the right-lower field reveals decreased breath sounds. Examination of the left-lower field reveals decreased breath sounds. Decreased breath sounds present. No wheezing, rhonchi or rales.  Abdominal:     Palpations: Abdomen is soft.     Tenderness: There is no abdominal tenderness.  Musculoskeletal:     Right lower leg: Swelling present.     Left lower leg: Swelling present.  Skin:    General: Skin is warm.      Comments: Chronic lower extremity skin discoloration  Neurological:     Mental Status: He is alert and oriented to person, place, and time.     Data Reviewed: Last weight 285.5.  Family Communication: Spoke with brother yesterday  Disposition: Status is: Inpatient Remains inpatient appropriate because: TOC looking into rehab beds.  Medically stable for discharge when we find 1  Planned Discharge Destination: rehab    Time spent: 28 minutes  Author: Charlie Patterson, MD 01/02/2024 1:21 PM  For on call review [email or web address].

## 2024-01-03 DIAGNOSIS — I480 Paroxysmal atrial fibrillation: Secondary | ICD-10-CM | POA: Diagnosis not present

## 2024-01-03 DIAGNOSIS — N179 Acute kidney failure, unspecified: Secondary | ICD-10-CM | POA: Diagnosis not present

## 2024-01-03 DIAGNOSIS — E039 Hypothyroidism, unspecified: Secondary | ICD-10-CM | POA: Diagnosis not present

## 2024-01-03 DIAGNOSIS — I5023 Acute on chronic systolic (congestive) heart failure: Secondary | ICD-10-CM | POA: Diagnosis not present

## 2024-01-03 LAB — RENAL FUNCTION PANEL
Albumin: 3.4 g/dL — ABNORMAL LOW (ref 3.5–5.0)
Anion gap: 11 (ref 5–15)
BUN: 49 mg/dL — ABNORMAL HIGH (ref 6–20)
CO2: 27 mmol/L (ref 22–32)
Calcium: 8.4 mg/dL — ABNORMAL LOW (ref 8.9–10.3)
Chloride: 94 mmol/L — ABNORMAL LOW (ref 98–111)
Creatinine, Ser: 5 mg/dL — ABNORMAL HIGH (ref 0.61–1.24)
GFR, Estimated: 13 mL/min — ABNORMAL LOW (ref >60)
Glucose, Bld: 117 mg/dL — ABNORMAL HIGH (ref 70–99)
Phosphorus: 4.2 mg/dL (ref 2.5–4.6)
Potassium: 3.9 mmol/L (ref 3.5–5.1)
Sodium: 132 mmol/L — ABNORMAL LOW (ref 135–145)

## 2024-01-03 NOTE — Progress Notes (Signed)
 [facility]  ROUNDING NOTE   Subjective:   Hector [name] is a 58 y.o. male with past medical conditions including hypertension, paroxysmal A-fib on Eliquis , CHF with a EF less than 20%, and chronic kidney disease stage IV. Patient presents to ED with shortness of breath and edema.  Patient currently admitted for Acute on chronic systolic CHF (congestive heart failure) (HCC) [I50.23] Acute on chronic congestive heart failure, unspecified heart failure type [facility]) [I50.9]  Patient known to our practice from previous admissions and is followed by [facility] nephrology outpatient.    Update Patient seen sitting at side of bed Alert and oriented, moments of confusion Denies shortness of breath, room air   Objective:  Vital signs in last 24 hours:  Temp:  [97.5 F (36.4 C)-98.7 F (37.1 C)] 98.3 F (36.8 C) (11/10 0952) Pulse Rate:  [64-88] 80 (11/10 0952) Resp:  [16-22] 16 (11/10 0555) BP: (92-110)/(72-91) 103/91 (11/10 0952) SpO2:  [88 %-100 %] 97 % (11/10 0952) Weight:  [129.9 kg] 129.9 kg (11/10 0411)  Weight change: -0.098 kg Filed Weights   01/02/24 0500 01/02/24 1000 01/03/24 0411  Weight: 132.9 kg 129.5 kg 129.9 kg    Intake/Output: I/O last 3 completed shifts: In: 745.1 [P.O.:620; I.V.:125.1] Out: 400 [Urine:400]   Intake/Output this shift:  No intake/output data recorded.  Physical Exam: General: NAD  Head: Normocephalic, atraumatic. Moist oral mucosal membranes  Eyes: Anicteric  Lungs:  Clear to auscultation, normal effort  Heart: Regular rate and rhythm  Abdomen:  Soft, nontender, distended  Extremities: 1+ peripheral edema.  Neurologic: Awake and alert  Skin: Warm,dry, no rash  Access Rt internal jugular permcath  Basic Metabolic Panel: Recent Labs  Lab 12/28/23 0428 12/29/23 0547 12/30/23 0422 12/31/23 0439 01/01/24 0527 01/02/24 0514 01/03/24 0427  NA 134*   < > 132* 134* 132* 132* 132*  K 3.8   < > 3.7 3.7 3.7 3.7 3.9  CL 96*   <  > 95* 96* 96* 96* 94*  CO2 26   < > 26 26 24 26 27   GLUCOSE 100*   < > 145* 118* 112* 116* 117*  BUN 65*   < > 48* 38* 46* 36* 49*  CREATININE 5.07*   < > 5.03* 4.23* 4.70* 3.81* 5.00*  CALCIUM  8.9   < > 8.2* 8.4* 8.3* 8.4* 8.4*  MG 2.4  --   --   --   --   --   --   PHOS 5.3*   < > 4.8* 4.7* 3.4 3.8 4.2   < > = values in this interval not displayed.    Liver Function Tests: Recent Labs  Lab 12/30/23 0422 12/31/23 0439 01/01/24 0527 01/02/24 0514 01/03/24 0427  ALBUMIN  3.1* 3.1* 3.0* 3.1* 3.4*   No results for input(s): LIPASE, AMYLASE in the last 168 hours.  No results for input(s): AMMONIA in the last 168 hours.  CBC: Recent Labs  Lab 12/28/23 1315 12/29/23 0820 01/01/24 1205  WBC 8.3 6.6 6.1  HGB 12.6* 12.2* 11.2*  HCT 38.9* 37.1* 34.5*  MCV 86.3 86.9 86.9  PLT 194 197 197    Cardiac Enzymes: No results for input(s): CKTOTAL, CKMB, CKMBINDEX, TROPONINI in the last 168 hours.  BNP: Invalid input(s): POCBNP  CBG: No results for input(s): GLUCAP in the last 168 hours.  Microbiology: Results for orders placed or performed during the hospital encounter of 12/23/23  Urine Culture (for pregnant, neutropenic or urologic patients or patients with an indwelling urinary  catheter)     Status: Abnormal   Collection Time: 12/23/23  7:40 PM   Specimen: Urine, Catheterized  Result Value Ref Range Status   Specimen Description   Final    URINE, CATHETERIZED Performed at [facility], [address] [place], [place] [address]    Special Requests   Final    NONE Performed at [facility], [address] [place], [place] [address]    Culture (A)  Final    <10,000 COLONIES/mL INSIGNIFICANT GROWTH Performed at [facility], [address], [place], [place] [address]    Report Status 12/25/2023 FINAL  Final    Coagulation Studies: No results for input(s): LABPROT, INR in the last 72 hours.  Urinalysis: No results  for input(s): COLORURINE, LABSPEC, PHURINE, GLUCOSEU, HGBUR, BILIRUBINUR, KETONESUR, PROTEINUR, UROBILINOGEN, NITRITE, LEUKOCYTESUR in the last 72 hours.  Invalid input(s): APPERANCEUR     Imaging: No results found.     Medications:      (feeding supplement) PROSource Plus  30 mL Oral TID BM   allopurinol   100 mg Oral Daily   amiodarone   200 mg Oral Daily   apixaban   5 mg Oral BID   atorvastatin   80 mg Oral Daily   Chlorhexidine  Gluconate Cloth  6 each Topical Q0600   ciprofloxacin   2 drop Both Eyes Q4H while awake   feeding supplement (NEPRO CARB STEADY)  237 mL Oral QHS   levothyroxine   125 mcg Oral Q0600   lidocaine   1 patch Transdermal Q12H   lipase/protease/amylase  36,000 Units Oral TID AC   midodrine  10 mg Oral Q T,Th,Sa-HD   multivitamin  1 tablet Oral QHS   polyethylene glycol  17 g Oral Daily   torsemide   100 mg Oral Q M,W,F   acetaminophen  **OR** acetaminophen , magnesium  hydroxide, methocarbamol , ondansetron  **OR** ondansetron  (ZOFRAN ) IV, mouth rinse, traZODone   Assessment/ Plan:  [name] is a 58 y.o.  male with past medical conditions including hypertension, paroxysmal A-fib on Eliquis , CHF with a EF less than 20%, and chronic kidney disease stage IV.  Patient currently admitted for Acute on chronic systolic CHF (congestive heart failure) (HCC) [I50.23] Acute on chronic congestive heart failure, unspecified heart failure type (HCC) [I50.9]   End stage renal disease on hemodialysis. Due to progressive decline in renal function, we feel patient is now considered stage V, end stage renal disease.   Due to persistent kidney failure and multiple readmissions due to volume overload, patient was agreeable to proceed with renal replacement therapy. Appreciate vascular surgery placing Rt internal jugular permcath.  Next treatment scheduled for Tuesday.  Receiving Midodrine 10mg  with dialysis. Restarted Torsemide  100mg  on  nondialysis days.  Appreciate dialysis navigator confirming outpatient clinic at [facility] on a TTS schedule. Pending rehab placement   Lab Results  Component Value Date   CREATININE 5.00 (H) 01/03/2024   CREATININE 3.81 (H) 01/02/2024   CREATININE 4.70 (H) 01/01/2024    Intake/Output Summary (Last 24 hours) at 01/03/2024 1144 Last data filed at 01/03/2024 0415 Gross per 24 hour  Intake 745.08 ml  Output 400 ml  Net 345.08 ml    2. Anemia of chronic kidney disease Lab Results  Component Value Date   HGB 11.2 (L) 01/01/2024  Hgb within optimal range No need for ESA.  3. Secondary Hyperparathyroidism: with outpatient labs: PTH 1207, phosphorus 5.6, calcium  9.4 on 12/07/2023.   Lab Results  Component Value Date   CALCIUM  8.4 (L) 01/03/2024  CAION 1.13 (L) 06/23/2023   CAION 1.10 (L) 06/23/2023   PHOS 4.2 01/03/2024    Calcium  and phos within optimal range  4. 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 Home regimen includes glipizide. Most recent hemoglobin A1c is 7.3 on 06/28/2023.   Diet controlled   LOS: 10 Derrall Hicks 11/10/202511:44 AM

## 2024-01-03 NOTE — Progress Notes (Signed)
 Physical Therapy Treatment Patient Details Name: Hector [name] MRN: [id number] DOB: [birth year] Today's Date: 01/03/2024   History of Present Illness Hector [name] is a 58 y.o. 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  Patient also had 50 pounds of weight gain over the past few months.  Patient was given IV Lasix , consulted nephrology.  Changed to Lasix  infusion 11/1.  Patient had a worsening renal function after Lasix , permacath was placed 11/4, hemodialysis started.    PT Comments  Co tx with OT this session for therapist safety. Orthostatics taken at beginning of session d/t pt report of dizziness with positional changes in previous sessions. Pt not orthostatic and asymptomatic throughout session. Pt demonstrates improved ability to perform functional tasks requiring less assistance for transfers compared to previous sessions. Pt able to perform step pivot transfer to chair with mod A for RW management and to prevent LOB. Pt R knee tremulous while taking steps to the chair. Pt requires min A x2 for STS from elevated surface. Cuing provided for technique/hand placement to promote successful stands from varying surfaces. Pt demonstrates bed mobility with supervision. Pt has been making good progress and would benefit from continued skilled physical therapy to promote safety and return to optimal level of function.    If plan is discharge home, recommend the following: Two people to help with walking and/or transfers;A lot of help with bathing/dressing/bathroom;Help with stairs or ramp for entrance   Can travel by private  vehicle     No  Equipment Recommendations  Rolling walker (2 wheels);BSC/3in1    Recommendations for Other Services       Precautions / Restrictions Precautions Precautions: Fall Recall of Precautions/Restrictions: Intact Restrictions Weight Bearing Restrictions Per Provider Order: No     Mobility  Bed Mobility Overal bed mobility: Needs Assistance Bed Mobility: Supine to Sit     Supine to sit: Supervision     General bed mobility comments: Use of bed rails. No cues needed.    Transfers Overall transfer level: Needs assistance Equipment used: Rolling walker (2 wheels) Transfers: Sit to/from Stand, Bed to chair/wheelchair/BSC Sit to Stand: Min assist, +2 physical assistance, From elevated surface   Step pivot transfers: Mod assist       General transfer comment: Step pivot from bed>chair. Mod A to prevent LOB and for RW management. Verbal cues for sequencing. Pt continues to require cuing for hand placement and technique prior to STS.    Ambulation/Gait Ambulation/Gait assistance: Min assist Gait Distance (Feet): 4 Feet Assistive device: Rolling walker (2 wheels) Gait Pattern/deviations: Step-to pattern, Trunk flexed       General Gait Details: Recliner places slightly further away requiring pt to take steps to chair. Pt's trunk remained in forward flexed posture throughout. Knees somewhat tremulous.   Stairs             Wheelchair Mobility     Tilt Bed    Modified Rankin (Stroke Patients Only)       Balance Overall balance assessment: Needs assistance Sitting-balance support: No upper extremity supported, Feet supported Sitting balance-Leahy Scale: Fair Sitting balance - Comments: sits at EOB with supervision   Standing balance support: Bilateral upper extremity supported,  During functional activity, Reliant on assistive device for balance Standing balance-Leahy Scale: Poor Standing balance comment: Heavy reliance on RW for support/to  maintain balance. No dizziness reported while standing.                            Communication Communication Communication: No apparent difficulties  Cognition Arousal: Alert Behavior During Therapy: WFL for tasks assessed/performed   PT - Cognitive impairments: No apparent impairments                       PT - Cognition Comments: pleasant and agreeable to session Following commands: Intact      Cueing Cueing Techniques: Verbal cues, Tactile cues  Exercises Other Exercises Other Exercises: Orthostatic vitals taken. Pt astymptomatic with positional changes and not orthostatic. Other Exercises: Mini squats using RW in front of recliner. Cuing provided to promote upright posture during concentric phase.  2 x 5reps.    General Comments        Pertinent Vitals/Pain Pain Assessment Pain Assessment: No/denies pain    Home Living                          Prior Function            PT Goals (current goals can now be found in the care plan section) Acute Rehab PT Goals PT Goal Formulation: With patient Time For Goal Achievement: 01/13/24 Potential to Achieve Goals: Good Progress towards PT goals: Progressing toward goals    Frequency    Min 2X/week      PT Plan      Co-evaluation PT/OT/SLP Co-Evaluation/Treatment: Yes Reason for Co-Treatment: For patient/therapist safety PT goals addressed during session: Mobility/safety with mobility OT goals addressed during session: ADL's and self-care      AM-PAC PT 6 Clicks Mobility   Outcome Measure  Help needed turning from your back to your side while in a flat bed without using bedrails?: A Little Help needed moving from lying on your back to sitting on the side of a flat bed without using bedrails?: A Little Help needed moving to and from a bed to a chair (including a wheelchair)?: A Lot Help needed standing up from a chair using your arms (e.g., wheelchair or bedside chair)?: A  Little Help needed to walk in hospital room?: A Lot Help needed climbing 3-5 steps with a railing? : A Lot 6 Click Score: 15    End of Session   Activity Tolerance: Patient tolerated treatment well Patient left: in chair;with call bell/phone within reach;with chair alarm set Nurse Communication: Mobility status PT Visit Diagnosis: Unsteadiness on feet (R26.81);Muscle weakness (generalized) (M62.81);Difficulty in walking, not elsewhere classified (R26.2)     Time: 8875-8852 PT Time Calculation (min) (ACUTE ONLY): 23 min  Charges:                            [name] Oshea, SPT'   Czar Ysaguirre 01/03/2024, 12:17 PM

## 2024-01-03 NOTE — Progress Notes (Signed)
 New Dialysis Start    Patient identified as new dialysis start. Kidney Education packet assembled and given. Discussed the following items with patient:     Current medications and possible changes once started:  Discussed that patient's medications may change over time.  Ex; hypertension medications and diabetes medication.  Nephrologists will adjust as needed.   Fluid restrictions reviewed:  32 oz daily goal:  All liquids count; soups, ice, jello, fruits. Will also refer dietitian.   Phosphorus and potassium: Handout given showing high potassium and phosphorus foods.  Alternative food and drink options given. Will also refer dietitian. Patient stated that he loves Cokes, so we discussed other options to steer away from extra Phosphorus.  We also talked about watermelon and potassium as well.   Family support:  None at bedside.   Outpatient Clinic Resources:  Discussed roles of Outpatient clinic staff and advised to make a list of needs, if any, to talk with outpatient staff if needed.   Care plan schedule: Informed patient of Care Plans in outpatient setting and to participate in the care plan.  An invitation would be given from outpatient clinic.    Dialysis Access Options:  Reviewed access options with patient. Discussed in detail about care at home with new AVG & AVF. Reviewed checking bruit and thrill. If dialysis catheter present, educated that patient could not take showers.  Catheter dressing changes were to be done by outpatient clinic staff only.     Patient verbalized understanding. Will continue to round on patient during admission.    Alfonso Heckler Dialysis Nurse Coordinator [phone] Secure Chat Available

## 2024-01-03 NOTE — TOC Progression Note (Signed)
 Transition of Care [facility]) - Progression Note    Patient Details  Name: Hector [name] MRN: [id number] Date of Birth: 1965-07-19  Transition of Care [facility] CM/SW Contact  Lauraine JAYSON Carpen, LCSW Phone Number: 01/03/2024, 4:08 PM  Clinical Narrative:  [facility] can offer patient a bed if their dialysis z[facility]orah can accept him and the hospital provides an LOG. TOC director approved two week LOG for room and board. Patient is aware.   Expected Discharge Plan: Home/Self Care Barriers to Discharge: Continued Medical Work up               Expected Discharge Plan and Services     Post Acute Care Choice: NA Living arrangements for the past 2 months: Single Family Home Expected Discharge Date: 01/02/24                                     Social Drivers of Health (SDOH) Interventions SDOH Screenings   Food Insecurity: No Food Insecurity (12/16/2023)  Housing: Unknown (12/16/2023)  Transportation Needs: No Transportation Needs (12/16/2023)  Utilities: Not At Risk (12/16/2023)  Alcohol Screen: Low Risk  (01/19/2023)  Depression (PHQ2-9): Low Risk  (08/19/2021)  Financial Resource Strain: Low Risk  (01/19/2023)  Physical Activity: Inactive (01/04/2019)  Social Connections: Unknown (12/16/2023)  Stress: No Stress Concern Present (01/04/2019)  Tobacco Use: Medium Risk (12/23/2023)    Readmission Risk Interventions    12/27/2023    3:10 PM 04/27/2022   11:48 AM 02/24/2022    3:53 PM  Readmission Risk Prevention Plan  Transportation Screening Complete Complete Complete  Medication Review Oceanographer) Complete Complete Complete  PCP or Specialist appointment within 3-5 days of discharge Complete Complete   SW Recovery Care/Counseling Consult Complete Complete Complete  Palliative Care Screening Not Applicable Not Applicable Not Applicable  Skilled Nursing Facility Not Applicable Not Applicable Not Applicable

## 2024-01-03 NOTE — Progress Notes (Signed)
 Occupational Therapy Treatment Patient Details Name: Hector [name] MRN: [id number] DOB: 1965-09-01 Today's Date: 01/03/2024   History of present illness Hector [name] is a 58 y.o. 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  Patient also had 50 pounds of weight gain over the past few months.  Patient was given IV Lasix , consulted nephrology.  Changed to Lasix  infusion 11/1.  Patient had a worsening renal function after Lasix , permacath was placed 11/4, hemodialysis started.   OT comments  Pt seen for OT/PT co-tx to optimize safety for ADL/mobility. Pt agreeable to session. Denies pain, endorses mild SOB. SpO2 91% on RA while supine in bed. Pt completed bed mobility with increased effort and supv. SpO2 improving with upright positioning and stayed >96% on RA for the duration of the session. Pt demonstrated improvements in mobility this date. Endorsed feeling fatigued and mildly SOB. Pt instructed in ECS and home/routines modifications to improve safety/indep while minimizing risk of over exertion. Pt verbalized understanding. Continues to benefit from skilled OT services.       If plan is discharge home, recommend the following:  A lot of help with walking and/or transfers;A lot of help with bathing/dressing/bathroom;Assistance with cooking/housework;Assistance with feeding;Direct supervision/assist for medications management;Direct supervision/assist for financial management;Assist for transportation;Help with stairs or ramp for entrance   Equipment Recommendations  Other (comment) (defer)    Recommendations for Other  Services      Precautions / Restrictions Precautions Precautions: Fall Recall of Precautions/Restrictions: Intact Restrictions Weight Bearing Restrictions Per Provider Order: No       Mobility Bed Mobility Overal bed mobility: Needs Assistance Bed Mobility: Supine to Sit     Supine to sit: Supervision     General bed mobility comments: Use of bed rails. No cues needed.    Transfers Overall transfer level: Needs assistance Equipment used: Rolling walker (2 wheels) Transfers: Sit to/from Stand, Bed to chair/wheelchair/BSC Sit to Stand: Min assist, +2 physical assistance, From elevated surface     Step pivot transfers: Mod assist     General transfer comment: Step pivot from bed>chair. Mod A to prevent LOB and for RW management. Verbal cues for sequencing. Pt continues to require cuing for hand placement and technique prior to STS.     Balance Overall balance assessment: Needs assistance Sitting-balance support: No upper extremity supported, Feet supported Sitting balance-Leahy Scale: Fair Sitting balance - Comments: sits at EOB with supervision   Standing balance support: Bilateral upper extremity supported, During functional activity, Reliant on assistive device for balance Standing balance-Leahy Scale: Poor Standing balance comment: Heavy reliance on RW for support/to maintain balance. No dizziness reported while standing.                           ADL either performed or assessed with clinical judgement   ADL Overall ADL's : Needs assistance/impaired     Grooming: Sitting;Wash/dry face;Set up;Wash/dry hands                                      Extremity/Trunk Assessment  Vision       Perception     Praxis     Communication Communication Communication: No apparent difficulties   Cognition Arousal: Alert Behavior During Therapy: WFL for tasks assessed/performed Cognition: No apparent impairments                                Following commands: Intact        Cueing   Cueing Techniques: Verbal cues, Tactile cues  Exercises Other Exercises Other Exercises: Orthostatic vitals taken. Pt astymptomatic with positional changes and not orthostatic. Other Exercises: Pt instructed in ECS and home/routines modifications to improve safety/indep while minimizing risk of over exertion.    Shoulder Instructions       General Comments      Pertinent Vitals/ Pain       Pain Assessment Pain Assessment: No/denies pain  Home Living                                          Prior Functioning/Environment              Frequency  Min 2X/week        Progress Toward Goals  OT Goals(current goals can now be found in the care plan section)  Progress towards OT goals: Progressing toward goals  Acute Rehab OT Goals Patient Stated Goal: get stronger OT Goal Formulation: With patient Time For Goal Achievement: 01/14/24 Potential to Achieve Goals: Fair  Plan      Co-evaluation    PT/OT/SLP Co-Evaluation/Treatment: Yes Reason for Co-Treatment: For patient/therapist safety PT goals addressed during session: Mobility/safety with mobility OT goals addressed during session: ADL's and self-care      AM-PAC OT 6 Clicks Daily Activity     Outcome Measure   Help from another person eating meals?: None Help from another person taking care of personal grooming?: A Little Help from another person toileting, which includes using toliet, bedpan, or urinal?: A Lot Help from another person bathing (including washing, rinsing, drying)?: A Lot Help from another person to put on and taking off regular upper body clothing?: A Little Help from another person to put on and taking off regular lower body clothing?: A Lot 6 Click Score: 16    End of Session Equipment Utilized During Treatment: Rolling walker (2 wheels)  OT Visit Diagnosis: Unsteadiness on feet  (R26.81);Muscle weakness (generalized) (M62.81)   Activity Tolerance Patient tolerated treatment well   Patient Left in chair;with call bell/phone within reach;with chair alarm set   Nurse Communication          Time: 8875-8852 OT Time Calculation (min): 23 min  Charges: OT General Charges $OT Visit: 1 Visit OT Treatments $Self Care/Home Management : 8-22 mins  Warren SAUNDERS., MPH, MS, OTR/L ascom [phone] 01/03/24, 1:07 PM

## 2024-01-03 NOTE — Progress Notes (Signed)
 Progress Note   Patient: Hector [name] FMW:[id number] DOB: 30-Sep-1965 DOA: 12/23/2023     10 DOS: the patient was seen and examined on 01/03/2024   Brief hospital course: Hector [name] is a 58 y.o. 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  Patient also had 50 pounds of weight gain over the past few months. Patient was given IV Lasix , consulted nephrology. Changed to Lasix  infusion 11/1. Patient had a worsening renal function after Lasix , permacath was placed 11/4, hemodialysis started.  11/5.  Patient seen at the end of hemodialysis.  Only remove 300 mL secondary to low blood pressure. 11/6.  Patient seen before hemodialysis.  Physical therapy will recommend rehab.  TOC consulted. 11/7.  Patient is constipated and ordered lactulose  and MiraLAX  for constipation. 11/8.  Case discussed with nephrology and will give midodrine prior to dialysis. 11/10.  Encouraged to get up and move around with physical therapy.  Patient must get stronger.   Assessment and Plan: * Acute on chronic systolic CHF (congestive heart failure) (HCC) Lasix  drip stopped on 11/6.  Dialysis started on 11/4.  Hypotension limiting medications for heart failure.  Dialysis to help out with fluid removal.  Midodrine prior to dialysis to help out with fluid removal. Torsemide  on nondialysis days.  Acute kidney injury superimposed on CKD Acute kidney injury on CKD stage IV.  Renal function worsened with diuretics and started on hemodialysis on 11/4.  Patient will be on Tuesday Thursday Saturday schedule.  Last creatinine 5.0.  Paroxysmal atrial  fibrillation (HCC) Continue amiodarone  and Eliquis .  Hypothyroidism Continue Synthroid   Gout Continue allopurinol .  Obesity (BMI 30-39.9) Class II obesity.  Current BMI 39.94 with current height and weight in computer  Acute lower UTI Ruled out.  Urine culture negative  Constipation Patient now having bowel movements.  Hypoxia Previously had a pulse ox of 89% on 2 L of oxygen.  Asked nursing staff to take off oxygen again.  CPAP at night for sleep apnea.  Weakness Physical therapy recommending rehab.  Walked 4 feet with physical therapy today.  Conjunctivitis Ciloxan  eyedrop  Pancreatic insufficiency Continue Creon .  Type 2 diabetes mellitus with chronic kidney disease, without long-term current use of insulin  (HCC) Hemoglobin A1c 7.4.  Continue to monitor with diet  Dyslipidemia Continue statin therapy.        Subjective: Patient encouraged to get up and move around with physical therapy team.  Continue doing straight leg raises limited.  Patient admitted with CHF exacerbation and had worsening renal function and started on dialysis.  Physical Exam: Vitals:   01/03/24 0404 01/03/24 0411 01/03/24 0555 01/03/24 0952  BP: 99/75 107/89  (!) 103/91  Pulse: 85 64  80  Resp:  19 16   Temp:    98.3 F (36.8 C)  TempSrc:      SpO2: 97%   97%  Weight:  129.9 kg    Height:       Physical Exam HENT:     Head: Normocephalic.     Mouth/Throat:     Pharynx: No oropharyngeal exudate.  Eyes:     General: Lids are normal.     Conjunctiva/sclera: Conjunctivae normal.  Cardiovascular:  Rate and Rhythm: Normal rate and regular rhythm.     Heart sounds: Normal heart sounds, S1 normal and S2 normal.  Pulmonary:     Breath sounds: Examination of the right-lower field reveals decreased breath sounds. Examination of the left-lower field reveals decreased breath sounds. Decreased breath sounds present. No wheezing, rhonchi or rales.  Abdominal:     Palpations: Abdomen  is soft.     Tenderness: There is no abdominal tenderness.  Musculoskeletal:     Right lower leg: Swelling present.     Left lower leg: Swelling present.  Skin:    General: Skin is warm.     Comments: Chronic lower extremity skin discoloration  Neurological:     Mental Status: He is alert and oriented to person, place, and time.     Data Reviewed: Today's creatinine 5, sodium 132  Family Communication: Updated brother on phone  Disposition: Status is: Inpatient Remains inpatient appropriate because: TOC to speak with patient about rehab bed  Planned Discharge Destination: Rehab    Time spent: 28 minutes  Author: Charlie Patterson, MD 01/03/2024 1:23 PM  For on call review [email or web address].

## 2024-01-04 DIAGNOSIS — E039 Hypothyroidism, unspecified: Secondary | ICD-10-CM | POA: Diagnosis not present

## 2024-01-04 DIAGNOSIS — I480 Paroxysmal atrial fibrillation: Secondary | ICD-10-CM | POA: Diagnosis not present

## 2024-01-04 DIAGNOSIS — N179 Acute kidney failure, unspecified: Secondary | ICD-10-CM | POA: Diagnosis not present

## 2024-01-04 DIAGNOSIS — I5023 Acute on chronic systolic (congestive) heart failure: Secondary | ICD-10-CM | POA: Diagnosis not present

## 2024-01-04 LAB — RENAL FUNCTION PANEL
Albumin: 3.4 g/dL — ABNORMAL LOW (ref 3.5–5.0)
Anion gap: 15 (ref 5–15)
BUN: 59 mg/dL — ABNORMAL HIGH (ref 6–20)
CO2: 24 mmol/L (ref 22–32)
Calcium: 8.7 mg/dL — ABNORMAL LOW (ref 8.9–10.3)
Chloride: 92 mmol/L — ABNORMAL LOW (ref 98–111)
Creatinine, Ser: 5.66 mg/dL — ABNORMAL HIGH (ref 0.61–1.24)
GFR, Estimated: 11 mL/min — ABNORMAL LOW (ref >60)
Glucose, Bld: 95 mg/dL (ref 70–99)
Phosphorus: 3.9 mg/dL (ref 2.5–4.6)
Potassium: 3.8 mmol/L (ref 3.5–5.1)
Sodium: 131 mmol/L — ABNORMAL LOW (ref 135–145)

## 2024-01-04 MED ORDER — HEPARIN SODIUM (PORCINE) 1000 UNIT/ML IJ SOLN
INTRAMUSCULAR | Status: AC
  Filled 2024-01-04: qty 5

## 2024-01-04 MED ORDER — NEPRO/CARBSTEADY PO LIQD
237.0000 mL | Freq: Two times a day (BID) | ORAL | Status: DC
  Administered 2024-01-05 – 2024-01-07 (×2): 237 mL via ORAL

## 2024-01-04 NOTE — Progress Notes (Addendum)
 Nutrition Follow-up  DOCUMENTATION CODES:   Morbid obesity  INTERVENTION:   -Continue renal MVI daily -Continue renal diet with 1.2 L fluid restriction -Increase Nepro Shake po to BID, each supplement provides 425 kcal and 19 grams protein  -Continue 30 ml Prosource Plus TID, each supplement provides 100 kcals and 15 grams protein -RD provided education on renal diet, highlighting sodium restriction and renal friendly beverages; patient will also have access to outpatient RD at outpatient HD center for further reinforcement   NUTRITION DIAGNOSIS:   Increased nutrient needs related to chronic illness (ESRD on HD) as evidenced by estimated needs.  Ongoing  GOAL:   Patient will meet greater than or equal to 90% of their needs  Progressing   MONITOR:   PO intake, Supplement acceptance  REASON FOR ASSESSMENT:   Consult Diet education  ASSESSMENT:   58 y.o. African-American male with medical history significant for chronic combined systolic and diastolic CHF with EF less than 20%, essential hypertension, dyslipidemia, paroxysmal atrial fibrillation, CVA and alcohol abuse who presented with acute onset of worsening dyspnea with associated lower extremity edema, orthopnea and paroxysmal nocturnal dyspnea as well as dyspnea  11/4- s/p Insertion of tunneled dialysis catheter right IJ approach with ultrasound and fluoroscopic guidance, HD initiated   Reviewed I/O's: +80 ml x 24 hours and -11.5 L since admission  UOP: 100 ml x 24 hours  Spoke with patient, who was sitting up in bed at time of visit. He reports he has just returned from HD and feels sleepy. He reports he often feels sleepy after HD treatments, but has been making efforts to stay awake. RD provided emotional support and acknowledged concerns; discussed that it is common to feel fatigued after treatments.   Patient reports good appetite PTA. He states he usually consumed 2-3 meals per day. Commonly consumed foods  include jalapeno and cheese subs. He also states I drink what I'm not supposed to. When asked to elaborate, he reports drinking a lot of cola drinks at home; also enjoys drinking gingerale and apple juice and occasional orange juice.   Over the past week since admission, he reports a decline in appetite. Per patient, he often feels hungry, but my appetite goes away once the food comes here. He endorses usually eating a few bites of meals before he finishes. He ordered a cheeseburger for lunch and is looking forward to it.   Patient denies any weight loss, but endorses edema PTA and that they have gotten a lot of it off. Reviewed weight history; patient has experienced a 1.6% weight loss over the past week. Patient admits to weight gain secondary to edema; this may be masking further weight loss as well as fat and muscle depletions.   Other than poor appetite and fatigue, he states HD treatments are otherwise going well. RD discussed basics of renal diet and rationale for ordering and restrictions. RD emphasized importance of limiting sodium and also highlighted importance of selecting renal friendly beverages; discussed renal friendly beverage alternatives, including apple juice, water, and clear colored sodas (gingerale, sprite, and 7-up). Patient states he is willing to adopt these changes. Discussed that diet recommendations may changed based upon lab results, adequacy of intake, and weight trends.   Explained why diet restrictions are needed and provided lists of foods to limit/avoid that are high potassium, sodium, and phosphorus. Provided specific recommendations on safer alternatives of these foods. Strongly encouraged compliance of this diet.   Discussed importance of protein intake at each meal  and snack. Provided examples of how to maximize protein intake throughout the day. Discussed need for fluid restriction with dialysis, importance of minimizing weight gain between HD treatments, and  renal-friendly beverage options.  Encouraged pt to discuss specific diet questions/concerns with RD at HD outpatient facility. Teach back method used.  Expect fair compliance.  Discussed importance of good meal and supplement intake to promote healing. Patient is drinking Nepro and Prosource supplements and is amenable to continue them.   Per TOC notes, plan to discharge to [facility] on LOG.   Medications reviewed and include creon , renal MVI, miralax , and demadex .   Labs reviewed: Na: 131 (inpatient orders for glycemic control are none).    NUTRITION - FOCUSED PHYSICAL EXAM:  Flowsheet Row Most Recent Value  Orbital Region No depletion  Upper Arm Region No depletion  Thoracic and Lumbar Region No depletion  Buccal Region No depletion  Temple Region Mild depletion  Clavicle Bone Region No depletion  Scapular Bone Region No depletion  Dorsal Hand No depletion  Patellar Region No depletion  Anterior Thigh Region No depletion  Posterior Calf Region No depletion  Edema (RD Assessment) Moderate  Hair Reviewed  Eyes Reviewed  Mouth Reviewed  Skin Reviewed  Nails Reviewed    Diet Order:   Diet Order             Diet renal with fluid restriction Fluid restriction: 1200 mL Fluid; Room service appropriate? Yes; Fluid consistency: Thin  Diet effective now                   EDUCATION NEEDS:   No education needs have been identified at this time  Skin:  Skin Assessment: Reviewed RN Assessment  Last BM:  01/02/24  Height:   Ht Readings from Last 1 Encounters:  12/23/23 5' 11 (1.803 m)    Weight:   Wt Readings from Last 1 Encounters:  01/04/24 129 kg    Ideal Body Weight:  78.2 kg  BMI:  Body mass index is 39.66 kg/m.  Estimated Nutritional Needs:   Kcal:  2150-2350  Protein:  120-135 grams  Fluid:  1000 ml + UOP    Margery ORN, RD, LDN, CDCES Registered Dietitian III Certified Diabetes Care and Education Specialist If unable to reach  this RD, please use RD Inpatient group chat on secure chat between hours of 8am-4 pm daily

## 2024-01-04 NOTE — Progress Notes (Signed)
 Occupational Therapy Treatment Patient Details Name: Hector [name] MRN: [id number] DOB: 05-Oct-1965 Today's Date: 01/04/2024   History of present illness Hector [name] is a 58 y.o. 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  Patient also had 50 pounds of weight gain over the past few months.  Patient was given IV Lasix , consulted nephrology.  Changed to Lasix  infusion 11/1.  Patient had a worsening renal function after Lasix , permacath was placed 11/4, hemodialysis started.   OT comments  Patient seen for OT treatment on this date. Upon arrival to room patient sitting EOB, reports just getting back from HD and is tired but he is agreeable to treatment. While sitting EOB, patient on RA, O2 checked and ranged from 92-96%. Patient performed sit<>stand with cues for hand placement, while attempting to stand from EOB with one hand on bed and one on walker patient unable to complete, patient requested to try by pulling up from walker, OT provided mod-max A steadying walker and patient transition to standing with min A for lifting while steadying walker; after 30 seconds of standing patient denied dizziness but requested to sit down due to back pain. Once in seated, patient was able to scoot up in bed, required mod A to lift BLE into bed and was able to pull self up in bed with increased effort. Patient very fatigued at end of visit and requested to sleep.  Patient ended treatment in bed with bed/chair alarm on and all needs within reach. Patient making good progress toward goals, will continue to follow POC. Discharge recommendation  remains appropriate.        If plan is discharge home, recommend the following:  A lot of help with walking and/or transfers;A lot of help with bathing/dressing/bathroom;Assistance with cooking/housework;Assistance with feeding;Direct supervision/assist for medications management;Direct supervision/assist for financial management;Assist for transportation;Help with stairs or ramp for entrance   Equipment Recommendations  Other (comment) (defer to next venue of care)    Recommendations for Other Services      Precautions / Restrictions Precautions Precautions: Fall Recall of Precautions/Restrictions: Intact Restrictions Weight Bearing Restrictions Per Provider Order: No       Mobility Bed Mobility Overal bed mobility: Needs Assistance Bed Mobility: Sit to Supine       Sit to supine: Mod assist   General bed mobility comments: required A to lift BLE into bed, was able to pull himself up in bed using rails with increased effort    Transfers Overall transfer level: Needs assistance Equipment used: Rolling walker (2 wheels) Transfers: Sit to/from Stand Sit to Stand: Mod assist, From elevated surface           General transfer comment: required steadying A of walker so patient could pull himself up, only tolerated for <1 minute     Balance Overall balance assessment: Needs assistance Sitting-balance support: No upper extremity supported, Feet supported Sitting balance-Leahy Scale: Good Sitting balance - Comments: sits at EOB with supervision   Standing balance support: Reliant on assistive device for balance, Bilateral upper extremity supported Standing balance-Leahy Scale: Poor Standing balance comment: Heavy reliance on RW for support/to maintain balance. No dizziness reported while standing.  ADL either performed or assessed with clinical judgement   ADL                                         General ADL  Comments: reports no issues with ADLs but refused/unable to participate in ADLs    Extremity/Trunk Assessment              Vision       Perception     Praxis     Communication Communication Communication: No apparent difficulties   Cognition Arousal: Alert Behavior During Therapy: WFL for tasks assessed/performed Cognition: No apparent impairments                               Following commands: Intact        Cueing   Cueing Techniques: Verbal cues, Tactile cues  Exercises      Shoulder Instructions       General Comments      Pertinent Vitals/ Pain       Pain Assessment Pain Assessment: 0-10 Pain Score: 5  Pain Location: back Pain Descriptors / Indicators: Aching Pain Intervention(s): Limited activity within patient's tolerance  Home Living                                          Prior Functioning/Environment              Frequency  Min 2X/week        Progress Toward Goals  OT Goals(current goals can now be found in the care plan section)  Progress towards OT goals: Progressing toward goals  Acute Rehab OT Goals Patient Stated Goal: to get some rest OT Goal Formulation: With patient Time For Goal Achievement: 01/14/24 Potential to Achieve Goals: Fair ADL Goals Pt Will Perform Grooming: with supervision;standing Pt Will Perform Lower Body Dressing: sit to/from stand;with supervision Pt Will Transfer to Toilet: with supervision;ambulating Pt Will Perform Toileting - Clothing Manipulation and hygiene: with supervision;sit to/from stand  Plan      Co-evaluation                 AM-PAC OT 6 Clicks Daily Activity     Outcome Measure   Help from another person eating meals?: None Help from another person taking care of personal grooming?: A Little Help from another person toileting, which includes using toliet, bedpan, or urinal?: A Lot Help from another person bathing (including washing,  rinsing, drying)?: A Lot Help from another person to put on and taking off regular upper body clothing?: A Little Help from another person to put on and taking off regular lower body clothing?: A Lot 6 Click Score: 16    End of Session Equipment Utilized During Treatment: Rolling walker (2 wheels)  OT Visit Diagnosis: Unsteadiness on feet (R26.81);Muscle weakness (generalized) (M62.81)   Activity Tolerance Patient limited by fatigue;Other (comment) (fatigued from HD)   Patient Left in bed;with bed alarm set;with call bell/phone within reach   Nurse Communication          Time: 8646-8591 OT Time Calculation (min): 15 min  Charges: OT General Charges $OT Visit: 1 Visit OT Treatments $Self Care/Home Management : 8-22 mins  Rogers Clause, OT/L MSOT, 01/04/2024

## 2024-01-04 NOTE — Progress Notes (Signed)
 Progress Note   Patient: Hector [name] FMW:[id number] DOB: 01/18/1966 DOA: 12/23/2023     11 DOS: the patient was seen and examined on 01/04/2024   Brief hospital course: Hector [name] is a 58 y.o. 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  Patient also had 50 pounds of weight gain over the past few months. Patient was given IV Lasix , consulted nephrology. Changed to Lasix  infusion 11/1. Patient had a worsening renal function after Lasix , permacath was placed 11/4, hemodialysis started.  11/5.  Patient seen at the end of hemodialysis.  Only remove 300 mL secondary to low blood pressure. 11/6.  Patient seen before hemodialysis.  Physical therapy will recommend rehab.  TOC consulted. 11/7.  Patient is constipated and ordered lactulose  and MiraLAX  for constipation. 11/8.  Case discussed with nephrology and will give midodrine prior to dialysis. 11/10.  Encouraged to get up and move around with physical therapy.  Patient must get stronger. 11/10.  Patient sleepy while on dialysis.  He only sleeps during dialysis.  Creatinine 5.66 today.    Assessment and Plan: * Acute on chronic systolic CHF (congestive heart failure) (HCC) Lasix  drip stopped on 11/6.  Dialysis started on 11/4.  Hypotension limiting medications for heart failure.  Dialysis to help out with fluid removal.  Midodrine prior to dialysis to help out with fluid removal. Torsemide  on nondialysis days.  Weight 284 with dialysis.  Acute kidney injury superimposed on CKD Acute kidney injury on CKD stage IV.  Renal function worsened with diuretics and started  on hemodialysis on 11/4.  Patient will be on Tuesday Thursday Saturday schedule.  Last creatinine 5.66.  Paroxysmal atrial fibrillation (HCC) Continue amiodarone  and Eliquis .  Hypothyroidism Continue Synthroid   Gout Continue allopurinol .  Obesity (BMI 30-39.9) Class II obesity.  Current BMI 39.66 with current height and weight in computer  Acute lower UTI Ruled out.  Urine culture negative  Constipation Patient had quite a few bowel movements the other day.  On MiraLAX .  Hypoxia Previously had a pulse ox of 89% on 2 L of oxygen.  CPAP at night for sleep apnea.  Asked nursing staff to take off oxygen during the day.  Weakness Physical therapy recommending rehab.  Continue working with PT and OT.  Conjunctivitis Completed Ciloxan  eyedrop  Pancreatic insufficiency Continue Creon .  Type 2 diabetes mellitus with chronic kidney disease, without long-term current use of insulin  (HCC) Hemoglobin A1c 7.4.  Continue to monitor with diet  Dyslipidemia Continue statin therapy.        Subjective: Patient feels a little sleepy.  Seen while on dialysis.  Midodrine prior to dialysis to help out with blood pressure so we can fluid removal.  Patient states his appetite is not back good.  Physical Exam: Vitals:   01/04/24 1148 01/04/24 1153 01/04/24 1204 01/04/24 1521  BP: 92/79 100/88 104/84 90/75  Pulse: 72 61 76 81  Resp: (!) 22 19 (!) 25 17  Temp:    97.6 F (36.4 C)  TempSrc:      SpO2: 100% 100%  98%  Weight:      Height:       Physical Exam HENT:     Head: Normocephalic.  Mouth/Throat:     Pharynx: No oropharyngeal exudate.  Eyes:     General: Lids are normal.     Conjunctiva/sclera: Conjunctivae normal.  Cardiovascular:     Rate and Rhythm: Normal rate and regular rhythm.     Heart sounds: Normal heart sounds, S1 normal and S2 normal.  Pulmonary:     Breath sounds: Examination of the right-lower field reveals decreased breath sounds. Examination of the  left-lower field reveals decreased breath sounds. Decreased breath sounds present. No wheezing, rhonchi or rales.  Abdominal:     Palpations: Abdomen is soft.     Tenderness: There is no abdominal tenderness.  Musculoskeletal:     Right lower leg: Swelling present.     Left lower leg: Swelling present.  Skin:    General: Skin is warm.     Comments: Chronic lower extremity skin discoloration  Neurological:     Mental Status: He is alert and oriented to person, place, and time.     Data Reviewed: Creatinine 5.66, sodium 131  Family Communication: Updated brother yesterday  Disposition: Status is: Inpatient Remains inpatient appropriate because: TOC looking into rehab with dialysis.  Planned Discharge Destination: Rehab    Time spent: 28 minutes Case discussed with nephrology  Author: Charlie Patterson, MD 01/04/2024 4:18 PM  For on call review [email or web address].

## 2024-01-04 NOTE — TOC Progression Note (Signed)
 Transition of Care [facility]) - Progression Note    Patient Details  Name: Hector [name] MRN: [id number] Date of Birth: 1965/08/30  Transition of Care [facility]) CM/SW Contact  Lauraine JAYSON Carpen, LCSW Phone Number: 01/04/2024, 10:10 AM  Clinical Narrative:  [facility] is having their [facility] team review clinicals.   Expected Discharge Plan: Home/Self Care Barriers to Discharge: Continued Medical Work up               Expected Discharge Plan and Services     Post Acute Care Choice: NA Living arrangements for the past 2 months: Single Family Home Expected Discharge Date: 01/02/24                                     Social Drivers of Health (SDOH) Interventions SDOH Screenings   Food Insecurity: No Food Insecurity (12/16/2023)  Housing: Unknown (12/16/2023)  Transportation Needs: No Transportation Needs (12/16/2023)  Utilities: Not At Risk (12/16/2023)  Alcohol Screen: Low Risk  (01/19/2023)  Depression (PHQ2-9): Low Risk  (08/19/2021)  Financial Resource Strain: Low Risk  (01/19/2023)  Physical Activity: Inactive (01/04/2019)  Social Connections: Unknown (12/16/2023)  Stress: No Stress Concern Present (01/04/2019)  Tobacco Use: Medium Risk (12/23/2023)    Readmission Risk Interventions    12/27/2023    3:10 PM 04/27/2022   11:48 AM 02/24/2022    3:53 PM  Readmission Risk Prevention Plan  Transportation Screening Complete Complete Complete  Medication Review Oceanographer) Complete Complete Complete  PCP or Specialist appointment within 3-5 days of discharge Complete Complete   SW Recovery Care/Counseling Consult Complete Complete Complete  Palliative Care Screening Not Applicable Not Applicable Not Applicable  Skilled Nursing Facility Not Applicable Not Applicable Not Applicable

## 2024-01-04 NOTE — Plan of Care (Signed)
  Problem: Fluid Volume: Goal: Ability to maintain a balanced intake and output will improve Outcome: Progressing   Problem: Metabolic: Goal: Ability to maintain appropriate glucose levels will improve Outcome: Progressing   Problem: Tissue Perfusion: Goal: Adequacy of tissue perfusion will improve Outcome: Progressing   Problem: Clinical Measurements: Goal: Ability to maintain clinical measurements within normal limits will improve Outcome: Progressing   Problem: Clinical Measurements: Goal: Respiratory complications will improve Outcome: Progressing

## 2024-01-04 NOTE — Progress Notes (Signed)
   01/04/24 1204  Vitals  BP 104/84  Pulse Rate 76  ECG Heart Rate 81  Resp (!) 25  Post Treatment  Tolerated HD Treatment Yes  Post-Hemodialysis Comments Goal has been met. removed. VS are stable. Dressing has been changed. CVC is clean, dry and intact. No verbalized concerns. Stable to return to unit floor

## 2024-01-04 NOTE — Progress Notes (Signed)
 [facility]  ROUNDING NOTE   Subjective:   Hector [name] is a 58 y.o. male with past medical conditions including hypertension, paroxysmal A-fib on Eliquis , CHF with a EF less than 20%, and chronic kidney disease stage IV. Patient presents to ED with shortness of breath and edema.  Patient currently admitted for Acute on chronic systolic CHF (congestive heart failure) (HCC) [I50.23] Acute on chronic congestive heart failure, unspecified heart failure type [facility]) [I50.9]  Patient known to our practice from previous admissions and is followed by [facility] nephrology outpatient.    Update Patient seen and evaluated during dialysis   HEMODIALYSIS FLOWSHEET:  Blood Flow Rate (mL/min): 299 mL/min Arterial Pressure (mmHg): -156.96 mmHg Venous Pressure (mmHg): 134.54 mmHg TMP (mmHg): 12.93 mmHg Ultrafiltration Rate (mL/min): 828 mL/min Dialysate Flow Rate (mL/min): 300 ml/min Dialysis Fluid Bolus: Normal Saline Bolus Amount (mL): 200 mL  Resting comfortably Continues to complain of weakness   Objective:  Vital signs in last 24 hours:  Temp:  [97.9 F (36.6 C)-98.6 F (37 C)] 98.3 F (36.8 C) (11/11 0742) Pulse Rate:  [62-88] 72 (11/11 1000) Resp:  [13-24] 24 (11/11 1000) BP: (89-117)/(69-89) 99/76 (11/11 1000) SpO2:  [93 %-100 %] 100 % (11/11 1000) Weight:  [129 kg-130.3 kg] 129 kg (11/11 0742)  Weight change: 0.798 kg Filed Weights   01/03/24 0411 01/04/24 0500 01/04/24 0742  Weight: 129.9 kg 130.3 kg 129 kg    Intake/Output: I/O last 3 completed shifts: In: 680 [P.O.:680] Out: 200 [Urine:200]   Intake/Output this shift:  No intake/output data recorded.  Physical Exam: General: NAD  Head: Normocephalic, atraumatic. Moist oral mucosal membranes  Eyes: Anicteric  Lungs:  Clear to auscultation, normal effort  Heart: Regular rate and rhythm  Abdomen:  Soft, nontender, distended  Extremities: 1+ peripheral edema.  Neurologic: Awake and alert  Skin:  Warm,dry, no rash  Access Rt internal jugular permcath  Basic Metabolic Panel: Recent Labs  Lab 12/31/23 0439 01/01/24 0527 01/02/24 0514 01/03/24 0427 01/04/24 0410  NA 134* 132* 132* 132* 131*  K 3.7 3.7 3.7 3.9 3.8  CL 96* 96* 96* 94* 92*  CO2 26 24 26 27 24   GLUCOSE 118* 112* 116* 117* 95  BUN 38* 46* 36* 49* 59*  CREATININE 4.23* 4.70* 3.81* 5.00* 5.66*  CALCIUM  8.4* 8.3* 8.4* 8.4* 8.7*  PHOS 4.7* 3.4 3.8 4.2 3.9    Liver Function Tests: Recent Labs  Lab 12/31/23 0439 01/01/24 0527 01/02/24 0514 01/03/24 0427 01/04/24 0410  ALBUMIN  3.1* 3.0* 3.1* 3.4* 3.4*   No results for input(s): LIPASE, AMYLASE in the last 168 hours.  No results for input(s): AMMONIA in the last 168 hours.  CBC: Recent Labs  Lab 12/28/23 1315 12/29/23 0820 01/01/24 1205  WBC 8.3 6.6 6.1  HGB 12.6* 12.2* 11.2*  HCT 38.9* 37.1* 34.5*  MCV 86.3 86.9 86.9  PLT 194 197 197    Cardiac Enzymes: No results for input(s): CKTOTAL, CKMB, CKMBINDEX, TROPONINI in the last 168 hours.  BNP: Invalid input(s): POCBNP  CBG: No results for input(s): GLUCAP in the last 168 hours.  Microbiology: Results for orders placed or performed during the hospital encounter of 12/23/23  Urine Culture (for pregnant, neutropenic or urologic patients or patients with an indwelling urinary catheter)     Status: Abnormal   Collection Time: 12/23/23  7:40 PM   Specimen: Urine, Catheterized  Result Value Ref Range Status   Specimen Description   Final    URINE, CATHETERIZED Performed at  [facility], [address], [place], [place] [address]    Special Requests   Final    NONE Performed at [facility], [address], [place], [place] [address]    Culture (A)  Final    <10,000 COLONIES/mL INSIGNIFICANT GROWTH Performed at [facility], [address], [place], [place] [address]    Report Status 12/25/2023 FINAL  Final    Coagulation Studies: No results  for input(s): LABPROT, INR in the last 72 hours.  Urinalysis: No results for input(s): COLORURINE, LABSPEC, PHURINE, GLUCOSEU, HGBUR, BILIRUBINUR, KETONESUR, PROTEINUR, UROBILINOGEN, NITRITE, LEUKOCYTESUR in the last 72 hours.  Invalid input(s): APPERANCEUR     Imaging: No results found.     Medications:      (feeding supplement) PROSource Plus  30 mL Oral TID BM   allopurinol   100 mg Oral Daily   amiodarone   200 mg Oral Daily   apixaban   5 mg Oral BID   atorvastatin   80 mg Oral Daily   Chlorhexidine  Gluconate Cloth  6 each Topical Q0600   ciprofloxacin   2 drop Both Eyes Q4H while awake   feeding supplement (NEPRO CARB STEADY)  237 mL Oral QHS   levothyroxine   125 mcg Oral Q0600   lidocaine   1 patch Transdermal Q12H   lipase/protease/amylase  36,000 Units Oral TID AC   midodrine  10 mg Oral Q T,Th,Sa-HD   multivitamin  1 tablet Oral QHS   polyethylene glycol  17 g Oral Daily   torsemide   100 mg Oral Q M,W,F   acetaminophen  **OR** acetaminophen , magnesium  hydroxide, methocarbamol , ondansetron  **OR** ondansetron  (ZOFRAN ) IV, mouth rinse, traZODone   Assessment/ Plan:  [name] is a 58 y.o.  male with past medical conditions including hypertension, paroxysmal A-fib on Eliquis , CHF with a EF less than 20%, and chronic kidney disease stage IV.  Patient currently admitted for Acute on chronic systolic CHF (congestive heart failure) (HCC) [I50.23] Acute on chronic congestive heart failure, unspecified heart failure type (HCC) [I50.9]   End stage renal disease on hemodialysis. Due to progressive decline in renal function, we feel patient is now considered stage V, end stage renal disease.   Due to persistent kidney failure and multiple readmissions due to volume overload, patient was agreeable to proceed with renal replacement therapy. Appreciate vascular surgery placing Rt internal jugular permcath.  Receiving dialysis today, UF goal 1.5  to 2 L as tolerated.  Next treatment scheduled for Thursday. Receiving Midodrine 10mg  with dialysis.  Continue torsemide  100mg  on nondialysis days.  Appreciate dialysis navigator confirming outpatient clinic at [facility] on a TTS schedule.    Lab Results  Component Value Date   CREATININE 5.66 (H) 01/04/2024   CREATININE 5.00 (H) 01/03/2024   CREATININE 3.81 (H) 01/02/2024    Intake/Output Summary (Last 24 hours) at 01/04/2024 1045 Last data filed at 01/04/2024 0400 Gross per 24 hour  Intake 180 ml  Output 100 ml  Net 80 ml    2. Anemia of chronic kidney disease Lab Results  Component Value Date   HGB 11.2 (L) 01/01/2024  Hgb within optimal range No need for ESA.  3. Secondary Hyperparathyroidism: with outpatient labs: PTH 1207, phosphorus 5.6, calcium  9.4 on 12/07/2023.   Lab Results  Component Value Date   CALCIUM  8.7 (L) 01/04/2024   CAION 1.13 (L) 06/23/2023   CAION 1.10 (L) 06/23/2023   PHOS 3.9 01/04/2024    Calcium  and phos within optimal range  4. Diabetes mellitus type  II with chronic kidney disease/renal manifestations: noninsulin dependent. Home regimen includes glipizide. Most recent hemoglobin A1c is 7.3 on 06/28/2023.   Diet controlled   LOS: 11 Hiba Garry 11/11/202510:45 AM

## 2024-01-05 ENCOUNTER — Inpatient Hospital Stay

## 2024-01-05 DIAGNOSIS — I5023 Acute on chronic systolic (congestive) heart failure: Secondary | ICD-10-CM | POA: Diagnosis not present

## 2024-01-05 LAB — RENAL FUNCTION PANEL
Albumin: 3.5 g/dL (ref 3.5–5.0)
Anion gap: 11 (ref 5–15)
BUN: 39 mg/dL — ABNORMAL HIGH (ref 6–20)
CO2: 26 mmol/L (ref 22–32)
Calcium: 8.6 mg/dL — ABNORMAL LOW (ref 8.9–10.3)
Chloride: 95 mmol/L — ABNORMAL LOW (ref 98–111)
Creatinine, Ser: 4.38 mg/dL — ABNORMAL HIGH (ref 0.61–1.24)
GFR, Estimated: 15 mL/min — ABNORMAL LOW (ref >60)
Glucose, Bld: 98 mg/dL (ref 70–99)
Phosphorus: 3.5 mg/dL (ref 2.5–4.6)
Potassium: 4 mmol/L (ref 3.5–5.1)
Sodium: 132 mmol/L — ABNORMAL LOW (ref 135–145)

## 2024-01-05 NOTE — TOC Progression Note (Addendum)
 Transition of Care [facility]) - Progression Note    Patient Details  Name: Hector [name] MRN: [id number] Date of Birth: [birth year]  Transition of Care [facility]) CM/SW Contact  Lauraine JAYSON Carpen, LCSW Phone Number: 01/05/2024, 10:33 AM  Clinical Narrative:   [name] is still working on setting patient up with their dialysis den.  3:17 pm: [facility] rep at [employer] is requesting a chest xray to rule out TB. Attending physician has ordered. CSW wrote up LOG form and emailed to [facility] and CMA.  Expected Discharge Plan: Home/Self Care Barriers to Discharge: Continued Medical Work up               Expected Discharge Plan and Services     Post Acute Care Choice: NA Living arrangements for the past 2 months: Single Family Home Expected Discharge Date: 01/02/24                                     Social Drivers of Health (SDOH) Interventions SDOH Screenings   Food Insecurity: No Food Insecurity (12/16/2023)  Housing: Unknown (12/16/2023)  Transportation Needs: No Transportation Needs (12/16/2023)  Utilities: Not At Risk (12/16/2023)  Alcohol Screen: Low Risk  (01/19/2023)  Depression (PHQ2-9): Low Risk  (08/19/2021)  Financial Resource Strain: Low Risk  (01/19/2023)  Physical Activity: Inactive (01/04/2019)  Social Connections: Unknown (12/16/2023)  Stress: No Stress Concern Present (01/04/2019)  Tobacco Use: Medium Risk (12/23/2023)    Readmission Risk Interventions    12/27/2023    3:10 PM 04/27/2022   11:48 AM 02/24/2022    3:53 PM  Readmission Risk Prevention Plan  Transportation Screening Complete Complete Complete  Medication Review Oceanographer) Complete Complete Complete  PCP or Specialist appointment within 3-5 days of discharge Complete Complete   SW Recovery Care/Counseling Consult Complete Complete Complete  Palliative Care Screening Not Applicable Not Applicable Not Applicable  Skilled Nursing Facility Not Applicable Not  Applicable Not Applicable

## 2024-01-05 NOTE — Progress Notes (Signed)
 Occupational Therapy Treatment Patient Details Name: Hector [name] MRN: [id number] DOB: 09/13/1965 Today's Date: 01/05/2024   History of present illness Hector [name] is a 58 y.o. 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  Patient also had 50 pounds of weight gain over the past few months.  Patient was given IV Lasix , consulted nephrology.  Changed to Lasix  infusion 11/1.  Patient had a worsening renal function after Lasix , permacath was placed 11/4, hemodialysis started.   OT comments  Patient seen for OT treatment on this date. Upon arrival to room patient sitting EOB finishing breakfast, agreeable to treatment. Patient reports back pain/stiffness 7/10, RN notified. Patient engaged in sitting exercises for core engagement/back stretching in preparation for increased independence with LB dressing/bathing to improve efficiency with reaching B distal LE (see below for therex). Patient performed sit<>stand with mod A for lifting, tolerated standing for 90 seconds before requesting to sit. Patient performed SPT from EOB to recliner, mod A for lifting A with r/w, able to step pivot to recliner with CGA/SBA.  Patient ended treatment in recliner with chair alarm on and all needs within reach. Patient making good progress toward goals, will continue to follow POC. Discharge recommendation remains appropriate.        If plan is discharge home, recommend the following:  A lot of help with walking and/or transfers;A lot of help with bathing/dressing/bathroom;Assistance with cooking/housework;Assistance with feeding;Direct  supervision/assist for medications management;Direct supervision/assist for financial management;Assist for transportation;Help with stairs or ramp for entrance   Equipment Recommendations  Other (comment) (defer to next venue of care)    Recommendations for Other Services      Precautions / Restrictions Precautions Precautions: Fall Recall of Precautions/Restrictions: Intact Restrictions Weight Bearing Restrictions Per Provider Order: No       Mobility Bed Mobility                    Transfers Overall transfer level: Needs assistance Equipment used: Rolling walker (2 wheels) Transfers: Sit to/from Stand, Bed to chair/wheelchair/BSC Sit to Stand: Mod assist, From elevated surface     Step pivot transfers: Mod assist     General transfer comment: lifting A from EOB, once in stance able to maintain stance with CGA, performed transfer with no A to lower down to bed/recliner     Balance Overall balance assessment: Needs assistance Sitting-balance support: No upper extremity supported, Feet supported Sitting balance-Leahy Scale: Good Sitting balance - Comments: sits at EOB with supervision   Standing balance support: Reliant on assistive device for balance, Bilateral upper extremity supported Standing balance-Leahy Scale: Poor Standing balance comment: Heavy reliance on RW for support/to maintain balance. No dizziness reported while standing.                           ADL either performed or assessed with clinical judgement   ADL                                              Extremity/Trunk Assessment Upper Extremity  Assessment Upper Extremity Assessment: Overall WFL for tasks assessed            Vision       Perception     Praxis     Communication Communication Communication: No apparent difficulties   Cognition Arousal: Alert Behavior During Therapy: WFL for tasks assessed/performed Cognition: No apparent  impairments                               Following commands: Intact        Cueing   Cueing Techniques: Verbal cues, Tactile cues  Exercises Exercises: Other exercises Other Exercises Other Exercises: abdominal twists x 10 reps Other Exercises: overhead lateral reach for core stretch x 6 reps Other Exercises: forward trunk flexion for scapular protraction and neck flexion stretch x 5 reps    Shoulder Instructions       General Comments      Pertinent Vitals/ Pain       Pain Assessment Pain Assessment: 0-10 Pain Score: 7  Pain Location: back Pain Intervention(s): Limited activity within patient's tolerance, RN gave pain meds during session  Home Living                                          Prior Functioning/Environment              Frequency  Min 2X/week        Progress Toward Goals  OT Goals(current goals can now be found in the care plan section)  Progress towards OT goals: Progressing toward goals  Acute Rehab OT Goals Patient Stated Goal: to go home OT Goal Formulation: With patient Time For Goal Achievement: 01/14/24 Potential to Achieve Goals: Fair ADL Goals Pt Will Perform Grooming: with supervision;standing Pt Will Perform Lower Body Dressing: sit to/from stand;with supervision Pt Will Transfer to Toilet: with supervision;ambulating Pt Will Perform Toileting - Clothing Manipulation and hygiene: with supervision;sit to/from stand  Plan      Co-evaluation                 AM-PAC OT 6 Clicks Daily Activity     Outcome Measure   Help from another person eating meals?: None Help from another person taking care of personal grooming?: A Little Help from another person toileting, which includes using toliet, bedpan, or urinal?: A Lot Help from another person bathing (including washing, rinsing, drying)?: A Lot Help from another person to put on and taking off regular upper body clothing?: A Little Help  from another person to put on and taking off regular lower body clothing?: A Lot 6 Click Score: 16    End of Session Equipment Utilized During Treatment: Rolling walker (2 wheels)  OT Visit Diagnosis: Unsteadiness on feet (R26.81);Muscle weakness (generalized) (M62.81)   Activity Tolerance Patient tolerated treatment well   Patient Left in chair;with call bell/phone within reach;with nursing/sitter in room;with chair alarm set   Nurse Communication          Time: [phone] OT Time Calculation (min): 21 min  Charges: OT General Charges $OT Visit: 1 Visit OT Treatments $Therapeutic Exercise: 8-22 mins  Rogers Clause, OT/L MSOT, 01/05/2024

## 2024-01-05 NOTE — Progress Notes (Signed)
 Patient refusing Bipap at bedtime. RN educated patient.

## 2024-01-05 NOTE — Progress Notes (Signed)
 Physical Therapy Treatment Patient Details Name: Hector [name] MRN: [id number] DOB: [birth year] Today's Date: 01/05/2024   History of Present Illness Hector [name] is a 58 y.o. 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  Patient also had 50 pounds of weight gain over the past few months.  Patient was given IV Lasix , consulted nephrology.  Changed to Lasix  infusion 11/1.  Patient had a worsening renal function after Lasix , permacath was placed 11/4, hemodialysis started.    PT Comments  Pt was received in chair and agreeable to PT intervention. Pt reported improved dizziness with sitting and a lot of fatigue but was agreeable to seated therapeutic exercises to improve strength and balance. Pt was instructed on the following exercises in seated: hip flexion, knee extension, heel/toe rocks, and trunk flexion without UE support. Pt instructed to complete x20 for each exercise. Pt reported feeling too tired to attempt STS to RW and did not want to transfer back to bed. O2 monitored on RA during session; 93% at baseline which dropped to 89% with activity but pt was able to recover to 95% with PLB techniques. Pt left in recliner with chair alarm set and all belongings and call bell within reach. Pt would continue to benefit from skilled PT services to address his reduction in strength and activity tolerance.    If plan is discharge home, recommend the following: Two people to help with walking and/or transfers;A lot of help with bathing/dressing/bathroom;Help with stairs or ramp for entrance   Can travel by private vehicle     No   Equipment Recommendations  Rolling walker (2 wheels);BSC/3in1    Recommendations for Other Services       Precautions / Restrictions Precautions Precautions: Fall Recall of Precautions/Restrictions: Intact Restrictions Weight Bearing Restrictions Per Provider Order: No     Mobility  Bed Mobility               General bed mobility comments: NT - pt received in chair    Transfers                   General transfer comment: NT - pt given therex in recliner but did not want to transfer out of chair back to bed    Ambulation/Gait               General Gait Details: NT - pt received in chair and did not want to transfer back to bed   Stairs             Wheelchair Mobility     Tilt Bed    Modified Rankin (Stroke Patients Only)       Balance Overall balance assessment: Needs assistance Sitting-balance support: No upper extremity supported, Feet supported Sitting balance-Leahy Scale: Good Sitting balance - Comments: sits EOBwithout UE support and with supervision       Standing balance comment: NT - pt received in chair and did not perform any standing activities today                            Communication Communication Communication: No apparent difficulties Factors Affecting Communication: Reduced clarity of  speech  Cognition Arousal: Alert Behavior During Therapy: WFL for tasks assessed/performed   PT - Cognitive impairments: No apparent impairments                       PT - Cognition Comments: pleasant and agreeable to session Following commands: Intact      Cueing Cueing Techniques: Verbal cues  Exercises Other Exercises Other Exercises: seated exercises x 20 each: marches, knee extension, heel/toe rocks, trunk flexion    General Comments General comments (skin integrity, edema, etc.): O2 monitored on RA during session. 93% at baseline which dropped to 89% with activity. O2 up to 95% with PLB  techniques. pt left on RA.      Pertinent Vitals/Pain Pain Assessment Pain Assessment: Faces Faces Pain Scale: Hurts little more Pain Location: back Pain Descriptors / Indicators: Aching Pain Intervention(s): Limited activity within patient's tolerance    Home Living                          Prior Function            PT Goals (current goals can now be found in the care plan section) Acute Rehab PT Goals Patient Stated Goal: none stated PT Goal Formulation: With patient Time For Goal Achievement: 01/13/24 Potential to Achieve Goals: Good Progress towards PT goals: Progressing toward goals    Frequency    Min 2X/week      PT Plan      Co-evaluation     PT goals addressed during session: Strengthening/ROM        AM-PAC PT 6 Clicks Mobility   Outcome Measure  Help needed turning from your back to your side while in a flat bed without using bedrails?: A Little Help needed moving from lying on your back to sitting on the side of a flat bed without using bedrails?: A Little Help needed moving to and from a bed to a chair (including a wheelchair)?: A Lot Help needed standing up from a chair using your arms (e.g., wheelchair or bedside chair)?: A Little Help needed to walk in hospital room?: A Lot Help needed climbing 3-5 steps with a railing? : A Lot 6 Click Score: 15    End of Session Equipment Utilized During Treatment: Other (comment) (pulse ox monitor) Activity Tolerance: Patient tolerated treatment well Patient left: in chair;with call bell/phone within reach;with chair alarm set Nurse Communication: Mobility status PT Visit Diagnosis: Unsteadiness on feet (R26.81);Muscle weakness (generalized) (M62.81);Difficulty in walking, not elsewhere classified (R26.2)     Time: 8989-8976 PT Time Calculation (min) (ACUTE ONLY): 13 min  Charges:                            Allena Bulls, SPT    Allena Bulls 01/05/2024, 10:48 AM

## 2024-01-05 NOTE — Progress Notes (Signed)
 PROGRESS NOTE    Hector [name]   FMW:[id number] DOB: 1965/06/19  DOA: 12/23/2023 Date of Service: 01/05/24 which is hospital day 12  PCP: [name], MD    Hospital course / significant events:  Hector [name] is a 58 y.o. 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  Patient also had 50 pounds of weight gain over the past few months. Patient was given IV Lasix , consulted nephrology. Changed to Lasix  infusion 11/1. Patient had a worsening renal function after Lasix , permacath was placed 11/4, hemodialysis started.   10/30: admission to hospitalist  11/01: lasix  infusion  11/02-11/03: worsening renal fxn  11/04: permcath placed 11/05.  Patient seen at the end of hemodialysis.  Only remove 300 mL secondary to low blood pressure. 11/06.  Patient seen before hemodialysis.  Physical therapy will recommend rehab.  TOC consulted. 11/07.  Patient is constipated and ordered lactulose  and MiraLAX  for constipation. 11/08.  Case discussed with nephrology and will give midodrine prior to dialysis. 11/10.  Encouraged to get up and move around with physical therapy.  Patient must get stronger. 11/11.  Patient sleepy while on dialysis.  He only sleeps during dialysis.  Creatinine 5.66. 11/12: at this point waiting on outpatient dialysis chair before ok for discharge      Consultants:  Vascular surgery   Procedures/Surgeries: 11/04: Insertion of tunneled dialysis catheter right IJ approach with ultrasound and fluoroscopic guidance -Dr Jama       ASSESSMENT & PLAN:   Acute on chronic systolic CHF  (congestive heart failure) Lasix  drip stopped on 11/6.  Dialysis started on 11/4.    Weight 284 with dialysis. Hypotension limiting medications for heart failure.  Dialysis to help out with fluid removal.   Midodrine prior to dialysis to help out with fluid removal.  Torsemide  on nondialysis days.   Acute kidney injury superimposed on CKD --> CKDV / renal failure  started on hemodialysis on 11/4.   Nephrology following   Patient will be on Tuesday Thursday Saturday dialysis schedule.   at this point waiting on outpatient dialysis chair before ok for discharge   Paroxysmal atrial fibrillation  Continue amiodarone  and Eliquis .   Hypothyroidism Continue Synthroid    Gout Continue allopurinol .   Acute lower UTI Ruled out.  Urine culture negative Monitor as needed   Constipation - resolved Patient had quite a few bowel movements the other day.   MiraLAX .   Hypoxia Previously had a pulse ox of 89% on 2 L of oxygen.   CPAP at night for sleep apnea.  off oxygen during the day.   Weakness Physical therapy recommending rehab.   Continue working with PT and OT.   Conjunctivitis Completed Ciloxan  eyedrop   Pancreatic insufficiency Continue Creon .   Type 2 diabetes mellitus with chronic kidney disease, without long-term current use of insulin  (HCC) Hemoglobin A1c 7.4.   Continue to monitor with diet   Dyslipidemia Continue statin therapy.      Class 2 obesity based on BMI: Body mass index is 38.53 kg/m.SABRA Significantly low or high BMI is associated with higher medical risk.  Underweight - under 18  overweight - 25 to 29 obese - 30 or more Class  1 obesity: BMI of 30.0 to 34 Class 2 obesity: BMI of 35.0 to 39 Class 3 obesity: BMI of 40.0 to 49 Super Morbid Obesity: BMI 50-59 Super-super Morbid Obesity: BMI 60+ Healthy nutrition and physical activity advised as adjunct to other disease management and risk reduction treatments    DVT prophylaxis: Eliquis  IV fluids: no  continuous IV fluids  Nutrition: renal Central lines / other devices: HD cath  Code Status: FULL CODE ACP documentation reviewed:  none on file in [facility]  TOC needs: placement, outpatient HD Medical barriers to dispo: none. Expected medical readiness for discharge once outpatient dialysis plan is in place.               Subjective / Brief ROS:  Patient reports no concerns today Denies CP/SOB.  Pain controlled.  Denies new weakness.  Tolerating diet.  Reports no concerns w/ urination/defecation.   Family Communication: none at this time     Objective Findings:  Vitals:   01/05/24 0433 01/05/24 0434 01/05/24 0759 01/05/24 1256  BP:  94/78 (!) 106/92 107/85  Pulse:  77 89 91  Resp:  19 17   Temp:  98.2 F (36.8 C) 98.4 F (36.9 C) 97.9 F (36.6 C)  TempSrc:  Oral    SpO2:  97% 98% 98%  Weight: 125.3 kg     Height:        Intake/Output Summary (Last 24 hours) at 01/05/2024 1425 Last data filed at 01/05/2024 1300 Gross per 24 hour  Intake 360 ml  Output 600 ml  Net -240 ml   Filed Weights   01/04/24 0742 01/04/24 1141 01/05/24 0433  Weight: 129 kg 128.2 kg 125.3 kg    Examination:  Physical Exam Constitutional:      General: He is not in acute distress. Cardiovascular:     Rate and Rhythm: Normal rate and regular rhythm.  Pulmonary:     Effort: Pulmonary effort is normal.     Breath sounds: Normal breath sounds.  Abdominal:     Palpations: Abdomen is soft.  Neurological:     Mental Status: He is alert.  Psychiatric:        Mood and Affect: Mood normal.        Behavior: Behavior normal.          Scheduled Medications:   (feeding supplement) PROSource Plus  30 mL Oral TID BM   allopurinol   100 mg Oral Daily   amiodarone   200 mg Oral Daily   apixaban   5 mg Oral BID   atorvastatin   80 mg Oral Daily   Chlorhexidine  Gluconate Cloth  6 each Topical Q0600   feeding supplement (NEPRO CARB STEADY)  237 mL Oral BID BM   levothyroxine   125  mcg Oral Q0600   lidocaine   1 patch Transdermal Q12H   lipase/protease/amylase  36,000 Units Oral TID AC   midodrine  10 mg Oral Q T,Th,Sa-HD   multivitamin  1 tablet Oral QHS   polyethylene glycol  17 g Oral Daily   torsemide   100 mg Oral Q M,W,F    Continuous Infusions:   PRN Medications:  acetaminophen  **OR** acetaminophen , magnesium  hydroxide, methocarbamol , ondansetron  **OR** ondansetron  (ZOFRAN ) IV, mouth rinse, traZODone   Antimicrobials from admission:  Anti-infectives (From admission, onward)    Start     Dose/Rate Route Frequency Ordered Stop   12/28/23 1530  ceFAZolin  (ANCEF ) IVPB 2g/100 mL premix  Status:  Discontinued        2 g 200 mL/hr over 30  Minutes Intravenous 30 min pre-op 12/28/23 1006 12/28/23 1107   12/28/23 1530  ceFAZolin  (ANCEF ) IVPB 1 g/50 mL premix        1 g 100 mL/hr over 30 Minutes Intravenous 30 min pre-op 12/28/23 1107 12/28/23 1137   12/24/23 0130  cefTRIAXone  (ROCEPHIN ) 2 g in sodium chloride  0.9 % 100 mL IVPB  Status:  Discontinued        2 g 200 mL/hr over 30 Minutes Intravenous Every 24 hours 12/24/23 0121 12/25/23 0950           Data Reviewed:  I have personally reviewed the following...  CBC: Recent Labs  Lab 01/01/24 1205  WBC 6.1  HGB 11.2*  HCT 34.5*  MCV 86.9  PLT 197   Basic Metabolic Panel: Recent Labs  Lab 01/01/24 0527 01/02/24 0514 01/03/24 0427 01/04/24 0410 01/05/24 0548  NA 132* 132* 132* 131* 132*  K 3.7 3.7 3.9 3.8 4.0  CL 96* 96* 94* 92* 95*  CO2 24 26 27 24 26   GLUCOSE 112* 116* 117* 95 98  BUN 46* 36* 49* 59* 39*  CREATININE 4.70* 3.81* 5.00* 5.66* 4.38*  CALCIUM  8.3* 8.4* 8.4* 8.7* 8.6*  PHOS 3.4 3.8 4.2 3.9 3.5   GFR: Estimated Creatinine Clearance: 24.5 mL/min (A) (by C-G formula based on SCr of 4.38 mg/dL (H)). Liver Function Tests: Recent Labs  Lab 01/01/24 0527 01/02/24 0514 01/03/24 0427 01/04/24 0410 01/05/24 0548  ALBUMIN  3.0* 3.1* 3.4* 3.4* 3.5   No results for input(s):  LIPASE, AMYLASE in the last 168 hours. No results for input(s): AMMONIA in the last 168 hours. Coagulation Profile: No results for input(s): INR, PROTIME in the last 168 hours. Cardiac Enzymes: No results for input(s): CKTOTAL, CKMB, CKMBINDEX, TROPONINI in the last 168 hours. BNP (last 3 results) No results for input(s): PROBNP in the last 8760 hours. HbA1C: No results for input(s): HGBA1C in the last 72 hours. CBG: No results for input(s): GLUCAP in the last 168 hours. Lipid Profile: No results for input(s): CHOL, HDL, LDLCALC, TRIG, CHOLHDL, LDLDIRECT in the last 72 hours. Thyroid  Function Tests: No results for input(s): TSH, T4TOTAL, FREET4, T3FREE, THYROIDAB in the last 72 hours. Anemia Panel: No results for input(s): VITAMINB12, FOLATE, FERRITIN, TIBC, IRON , RETICCTPCT in the last 72 hours. Most Recent Urinalysis On File:     Component Value Date/Time   COLORURINE YELLOW (A) 12/23/2023 1940   APPEARANCEUR CLEAR (A) 12/23/2023 1940   APPEARANCEUR Clear 02/26/2014 1126   LABSPEC 1.007 12/23/2023 1940   LABSPEC 1.008 02/26/2014 1126   PHURINE 7.0 12/23/2023 1940   GLUCOSEU >=500 (A) 12/23/2023 1940   GLUCOSEU Negative 02/26/2014 1126   HGBUR MODERATE (A) 12/23/2023 1940   BILIRUBINUR NEGATIVE 12/23/2023 1940   BILIRUBINUR Negative 02/26/2014 1126   KETONESUR NEGATIVE 12/23/2023 1940   PROTEINUR 100 (A) 12/23/2023 1940   NITRITE NEGATIVE 12/23/2023 1940   LEUKOCYTESUR SMALL (A) 12/23/2023 1940   LEUKOCYTESUR 1+ 02/26/2014 1126   Sepsis Labs: @LABRCNTIP (procalcitonin:4,lacticidven:4) Microbiology: No results found for this or any previous visit (from the past 240 hours).    Radiology Studies last 3 days: No results found.     Burton Gahan, DO [facility] 01/05/2024, 2:25 PM    Dictation software may have been used to generate the above note. Typos may occur and escape review in  typed/dictated notes. Please contact Dr Marsa directly for clarity if needed.  Staff may message me via secure chat in Epic  but this may not receive an immediate  response,  please page me for urgent matters!  If 7PM-7AM, please contact night coverage [email or web address]

## 2024-01-05 NOTE — Progress Notes (Signed)
 [facility]  ROUNDING NOTE   Subjective:   Hector [name] is a 58 y.o. male with past medical conditions including hypertension, paroxysmal A-fib on Eliquis , CHF with a EF less than 20%, and chronic kidney disease stage IV. Patient presents to ED with shortness of breath and edema.  Patient currently admitted for Acute on chronic systolic CHF (congestive heart failure) (HCC) [I50.23] Acute on chronic congestive heart failure, unspecified heart failure type [facility]) [I50.9]  Patient known to our practice from previous admissions and is followed by [facility] nephrology outpatient.    Update Patient seen sitting up in bed Remains on room air Continues to have weakness   Objective:  Vital signs in last 24 hours:  Temp:  [97.5 F (36.4 C)-98.4 F (36.9 C)] 98.4 F (36.9 C) (11/12 0759) Pulse Rate:  [71-89] 89 (11/12 0759) Resp:  [17-19] 17 (11/12 0759) BP: (90-106)/(71-92) 106/92 (11/12 0759) SpO2:  [95 %-100 %] 98 % (11/12 0759) Weight:  [125.3 kg] 125.3 kg (11/12 0433)  Weight change: -1.3 kg Filed Weights   01/04/24 0742 01/04/24 1141 01/05/24 0433  Weight: 129 kg 128.2 kg 125.3 kg    Intake/Output: I/O last 3 completed shifts: In: 420 [P.O.:420] Out: 2550 [Urine:550; Other:2000]   Intake/Output this shift:  Total I/O In: 120 [P.O.:120] Out: -   Physical Exam: General: NAD  Head: Normocephalic, atraumatic. Moist oral mucosal membranes  Eyes: Anicteric  Lungs:  Clear to auscultation, normal effort  Heart: Regular rate and rhythm  Abdomen:  Soft, nontender, distended  Extremities: 1+ peripheral edema.  Neurologic: Awake and alert  Skin: Warm,dry, no rash  Access Rt internal jugular permcath  Basic Metabolic Panel: Recent Labs  Lab 01/01/24 0527 01/02/24 0514 01/03/24 0427 01/04/24 0410 01/05/24 0548  NA 132* 132* 132* 131* 132*  K 3.7 3.7 3.9 3.8 4.0  CL 96* 96* 94* 92* 95*  CO2 24 26 27 24 26   GLUCOSE 112* 116* 117* 95 98  BUN 46* 36* 49*  59* 39*  CREATININE 4.70* 3.81* 5.00* 5.66* 4.38*  CALCIUM  8.3* 8.4* 8.4* 8.7* 8.6*  PHOS 3.4 3.8 4.2 3.9 3.5    Liver Function Tests: Recent Labs  Lab 01/01/24 0527 01/02/24 0514 01/03/24 0427 01/04/24 0410 01/05/24 0548  ALBUMIN  3.0* 3.1* 3.4* 3.4* 3.5   No results for input(s): LIPASE, AMYLASE in the last 168 hours.  No results for input(s): AMMONIA in the last 168 hours.  CBC: Recent Labs  Lab 01/01/24 1205  WBC 6.1  HGB 11.2*  HCT 34.5*  MCV 86.9  PLT 197    Cardiac Enzymes: No results for input(s): CKTOTAL, CKMB, CKMBINDEX, TROPONINI in the last 168 hours.  BNP: Invalid input(s): POCBNP  CBG: No results for input(s): GLUCAP in the last 168 hours.  Microbiology: Results for orders placed or performed during the hospital encounter of 12/23/23  Urine Culture (for pregnant, neutropenic or urologic patients or patients with an indwelling urinary catheter)     Status: Abnormal   Collection Time: 12/23/23  7:40 PM   Specimen: Urine, Catheterized  Result Value Ref Range Status   Specimen Description   Final    URINE, CATHETERIZED Performed at [facility], [address] [place], [place] [address]    Special Requests   Final    NONE Performed at [facility], [address] [place], [place] [address]    Culture (A)  Final    <10,000 COLONIES/mL INSIGNIFICANT GROWTH Performed at [facility], [address], [place], [place] [address]    Report Status 12/25/2023 FINAL  Final    Coagulation Studies: No results for input(s): LABPROT, INR in the last 72 hours.  Urinalysis: No results for input(s): COLORURINE, LABSPEC, PHURINE, GLUCOSEU, HGBUR, BILIRUBINUR, KETONESUR, PROTEINUR, UROBILINOGEN, NITRITE, LEUKOCYTESUR in the last 72 hours.  Invalid input(s): APPERANCEUR     Imaging: No results found.     Medications:      (feeding supplement) PROSource Plus  30 mL Oral TID  BM   allopurinol   100 mg Oral Daily   amiodarone   200 mg Oral Daily   apixaban   5 mg Oral BID   atorvastatin   80 mg Oral Daily   Chlorhexidine  Gluconate Cloth  6 each Topical Q0600   feeding supplement (NEPRO CARB STEADY)  237 mL Oral BID BM   levothyroxine   125 mcg Oral Q0600   lidocaine   1 patch Transdermal Q12H   lipase/protease/amylase  36,000 Units Oral TID AC   midodrine  10 mg Oral Q T,Th,Sa-HD   multivitamin  1 tablet Oral QHS   polyethylene glycol  17 g Oral Daily   torsemide   100 mg Oral Q M,W,F   acetaminophen  **OR** acetaminophen , magnesium  hydroxide, methocarbamol , ondansetron  **OR** ondansetron  (ZOFRAN ) IV, mouth rinse, traZODone   Assessment/ Plan:  [name] is a 58 y.o.  male with past medical conditions including hypertension, paroxysmal A-fib on Eliquis , CHF with a EF less than 20%, and chronic kidney disease stage IV.  Patient currently admitted for Acute on chronic systolic CHF (congestive heart failure) (HCC) [I50.23] Acute on chronic congestive heart failure, unspecified heart failure type (HCC) [I50.9]   End stage renal disease on hemodialysis. Due to progressive decline in renal function, we feel patient is now considered stage V, end stage renal disease.   Due to persistent kidney failure and multiple readmissions due to volume overload, patient was agreeable to proceed with renal replacement therapy. Appreciate vascular surgery placing Rt internal jugular permcath.  UF 2L achieved. Next treatment scheduled for Thursday. Receiving Midodrine 10mg  with dialysis.  Continue torsemide  100mg  on nondialysis days.  Appreciate dialysis navigator confirming outpatient clinic at [facility] on a TTS schedule.    Lab Results  Component Value Date   CREATININE 4.38 (H) 01/05/2024   CREATININE 5.66 (H) 01/04/2024   CREATININE 5.00 (H) 01/03/2024    Intake/Output Summary (Last 24 hours) at 01/05/2024 1214 Last data filed at 01/05/2024  0900 Gross per 24 hour  Intake 360 ml  Output 450 ml  Net -90 ml    2. Anemia of chronic kidney disease Lab Results  Component Value Date   HGB 11.2 (L) 01/01/2024  Hgb stable, will continue to monitor   3. Secondary Hyperparathyroidism: with outpatient labs: PTH 1207, phosphorus 5.6, calcium  9.4 on 12/07/2023.   Lab Results  Component Value Date   CALCIUM  8.6 (L) 01/05/2024   CAION 1.13 (L) 06/23/2023   CAION 1.10 (L) 06/23/2023   PHOS 3.5 01/05/2024    Calcium  and phos stable  4. 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 Home regimen includes glipizide. Most recent hemoglobin A1c is 7.3 on 06/28/2023.   Diet controlled   LOS: 12 Cannan Beeck 11/12/202512:14 PM

## 2024-01-06 ENCOUNTER — Inpatient Hospital Stay: Admitting: Family

## 2024-01-06 DIAGNOSIS — I5023 Acute on chronic systolic (congestive) heart failure: Secondary | ICD-10-CM | POA: Diagnosis not present

## 2024-01-06 LAB — RENAL FUNCTION PANEL
Albumin: 3.5 g/dL (ref 3.5–5.0)
Anion gap: 10 (ref 5–15)
BUN: 47 mg/dL — ABNORMAL HIGH (ref 6–20)
CO2: 27 mmol/L (ref 22–32)
Calcium: 8.6 mg/dL — ABNORMAL LOW (ref 8.9–10.3)
Chloride: 95 mmol/L — ABNORMAL LOW (ref 98–111)
Creatinine, Ser: 5.05 mg/dL — ABNORMAL HIGH (ref 0.61–1.24)
GFR, Estimated: 12 mL/min — ABNORMAL LOW (ref >60)
Glucose, Bld: 79 mg/dL (ref 70–99)
Phosphorus: 4.1 mg/dL (ref 2.5–4.6)
Potassium: 4.2 mmol/L (ref 3.5–5.1)
Sodium: 132 mmol/L — ABNORMAL LOW (ref 135–145)

## 2024-01-06 LAB — CBC
HCT: 35.2 % — ABNORMAL LOW (ref 39.0–52.0)
Hemoglobin: 11.4 g/dL — ABNORMAL LOW (ref 13.0–17.0)
MCH: 28.2 pg (ref 26.0–34.0)
MCHC: 32.4 g/dL (ref 30.0–36.0)
MCV: 87.1 fL (ref 80.0–100.0)
Platelets: 243 K/uL (ref 150–400)
RBC: 4.04 MIL/uL — ABNORMAL LOW (ref 4.22–5.81)
RDW: 17.9 % — ABNORMAL HIGH (ref 11.5–15.5)
WBC: 6.7 K/uL (ref 4.0–10.5)
nRBC: 0 % (ref 0.0–0.2)

## 2024-01-06 MED ORDER — NEPRO/CARBSTEADY PO LIQD
237.0000 mL | Freq: Two times a day (BID) | ORAL | Status: AC
Start: 2024-01-07 — End: ?

## 2024-01-06 MED ORDER — HEPARIN SODIUM (PORCINE) 1000 UNIT/ML IJ SOLN
INTRAMUSCULAR | Status: AC
  Filled 2024-01-06: qty 4

## 2024-01-06 MED ORDER — RENA-VITE PO TABS: 1.0000 | ORAL_TABLET | Freq: Every day | ORAL | Status: AC

## 2024-01-06 MED ORDER — TORSEMIDE 100 MG PO TABS: 100.0000 mg | ORAL_TABLET | ORAL | Status: DC

## 2024-01-06 MED ORDER — MIDODRINE HCL 10 MG PO TABS
10.0000 mg | ORAL_TABLET | ORAL | Status: AC
Start: 2024-01-08 — End: ?

## 2024-01-06 MED ORDER — PROSOURCE PLUS PO LIQD
30.0000 mL | Freq: Three times a day (TID) | ORAL | Status: AC
Start: 2024-01-06 — End: ?

## 2024-01-06 MED ORDER — ONDANSETRON HCL 4 MG PO TABS: 4.0000 mg | ORAL_TABLET | Freq: Four times a day (QID) | ORAL | Status: AC | PRN

## 2024-01-06 MED ORDER — TRAZODONE HCL 50 MG PO TABS: 25.0000 mg | ORAL_TABLET | Freq: Every evening | ORAL | Status: AC | PRN

## 2024-01-06 MED ORDER — ACETAMINOPHEN 325 MG PO TABS: 650.0000 mg | ORAL_TABLET | Freq: Four times a day (QID) | ORAL | Status: AC | PRN

## 2024-01-06 MED ORDER — APIXABAN 5 MG PO TABS: 5.0000 mg | ORAL_TABLET | Freq: Two times a day (BID) | ORAL | Status: AC

## 2024-01-06 NOTE — Progress Notes (Signed)
 Hemodialysis Note:  Received patient in bed to unit. Alert and oriented. Informed consent singed and in chart.  Treatment initiated: 0810 Treatment completed: 1146  Access used: Right internal jugular catheter Access issues: None  Patient tolerated well. Transported back to room, alert without acute distress. Report given to patient's RN.  Total UF removed: 2 Liters Medications given: Midodrine 10 mg Tablet  Post HD weight: 124.7 Kg  [facility]

## 2024-01-06 NOTE — Plan of Care (Signed)
 Problem: Education: Goal: Ability to describe self-care measures that may prevent or decrease complications (Diabetes Survival Skills Education) will improve Outcome: Progressing Goal: Individualized Educational Video(s) Outcome: Progressing   Problem: Coping: Goal: Ability to adjust to condition or change in health will improve Outcome: Progressing   Problem: Fluid Volume: Goal: Ability to maintain a balanced intake and output will improve Outcome: Progressing   Problem: Health Behavior/Discharge Planning: Goal: Ability to identify and utilize available resources and services will improve Outcome: Progressing Goal: Ability to manage health-related needs will improve Outcome: Progressing   Problem: Metabolic: Goal: Ability to maintain appropriate glucose levels will improve Outcome: Progressing   Problem: Nutritional: Goal: Maintenance of adequate nutrition will improve Outcome: Progressing Goal: Progress toward achieving an optimal weight will improve Outcome: Progressing   Problem: Skin Integrity: Goal: Risk for impaired skin integrity will decrease Outcome: Progressing   Problem: Tissue Perfusion: Goal: Adequacy of tissue perfusion will improve Outcome: Progressing   Problem: Education: Goal: Knowledge of General Education information will improve Description: Including pain rating scale, medication(s)/side effects and non-pharmacologic comfort measures Outcome: Progressing   Problem: Health Behavior/Discharge Planning: Goal: Ability to manage health-related needs will improve Outcome: Progressing   Problem: Clinical Measurements: Goal: Ability to maintain clinical measurements within normal limits will improve Outcome: Progressing Goal: Will remain free from infection Outcome: Progressing Goal: Diagnostic test results will improve Outcome: Progressing Goal: Respiratory complications will improve Outcome: Progressing Goal: Cardiovascular complication will  be avoided Outcome: Progressing   Problem: Activity: Goal: Risk for activity intolerance will decrease Outcome: Progressing   Problem: Nutrition: Goal: Adequate nutrition will be maintained Outcome: Progressing   Problem: Coping: Goal: Level of anxiety will decrease Outcome: Progressing   Problem: Elimination: Goal: Will not experience complications related to bowel motility Outcome: Progressing Goal: Will not experience complications related to urinary retention Outcome: Progressing   Problem: Pain Managment: Goal: General experience of comfort will improve and/or be controlled Outcome: Progressing   Problem: Safety: Goal: Ability to remain free from injury will improve Outcome: Progressing   Problem: Skin Integrity: Goal: Risk for impaired skin integrity will decrease Outcome: Progressing   Problem: Education: Goal: Ability to demonstrate management of disease process will improve Outcome: Progressing Goal: Ability to verbalize understanding of medication therapies will improve Outcome: Progressing Goal: Individualized Educational Video(s) Outcome: Progressing   Problem: Activity: Goal: Capacity to carry out activities will improve Outcome: Progressing   Problem: Cardiac: Goal: Ability to achieve and maintain adequate cardiopulmonary perfusion will improve Outcome: Progressing   Problem: Education: Goal: Knowledge of disease and its progression will improve Outcome: Progressing Goal: Individualized Educational Video(s) Outcome: Progressing   Problem: Fluid Volume: Goal: Compliance with measures to maintain balanced fluid volume will improve Outcome: Progressing   Problem: Health Behavior/Discharge Planning: Goal: Ability to manage health-related needs will improve Outcome: Progressing   Problem: Nutritional: Goal: Ability to make healthy dietary choices will improve Outcome: Progressing   Problem: Clinical Measurements: Goal: Complications related  to the disease process, condition or treatment will be avoided or minimized Outcome: Progressing   Problem: Education: Goal: Knowledge of disease or condition will improve Outcome: Progressing Goal: Understanding of medication regimen will improve Outcome: Progressing Goal: Individualized Educational Video(s) Outcome: Progressing   Problem: Activity: Goal: Ability to tolerate increased activity will improve Outcome: Progressing   Problem: Cardiac: Goal: Ability to achieve and maintain adequate cardiopulmonary perfusion will improve Outcome: Progressing   Problem: Health Behavior/Discharge Planning: Goal: Ability to safely manage health-related needs after discharge will improve Outcome: Progressing  Problem: Education: Goal: Knowledge of disease or condition will improve Outcome: Progressing Goal: Knowledge of the prescribed therapeutic regimen will improve Outcome: Progressing Goal: Individualized Educational Video(s) Outcome: Progressing

## 2024-01-06 NOTE — Progress Notes (Signed)
 Report given to dialysis via phone. All question and concerns addressed.

## 2024-01-06 NOTE — Progress Notes (Signed)
 Pt being transferred over to 1 c with cell phone, charger, jacket, sandals, belt, pants, keys,shirt, and a pair of socks.

## 2024-01-06 NOTE — Progress Notes (Deleted)
 PROGRESS NOTE    Hector [name]   FMW:[id number] DOB: June 06, 1965  DOA: 12/23/2023 Date of Service: 01/06/24 which is hospital day 13  PCP: [name], MD    Hospital course / significant events:  Hector [name] is a 58 y.o. 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  Patient also had 50 pounds of weight gain over the past few months. Patient was given IV Lasix , consulted nephrology. Changed to Lasix  infusion 11/1. Patient had a worsening renal function after Lasix , permacath was placed 11/4, hemodialysis started.   10/30: admission to hospitalist  11/01: lasix  infusion  11/02-11/03: worsening renal fxn  11/04: permcath placed 11/05.  Patient seen at the end of hemodialysis.  Only remove 300 mL secondary to low blood pressure. 11/06.  Patient seen before hemodialysis.  Physical therapy will recommend rehab.  TOC consulted. 11/07.  Patient is constipated and ordered lactulose  and MiraLAX  for constipation. 11/08.  Case discussed with nephrology and will give midodrine prior to dialysis. 11/10.  Encouraged to get up and move around with physical therapy.  Patient must get stronger. 11/11.  Patient sleepy while on dialysis.  He only sleeps during dialysis.  Creatinine 5.66. 11/12-11/13: at this point waiting on outpatient dialysis chair before ok for discharge      Consultants:  Vascular surgery   Procedures/Surgeries: 11/04: Insertion of tunneled dialysis catheter right IJ approach with ultrasound and fluoroscopic guidance -Dr Jama       ASSESSMENT & PLAN:   Acute on chronic systolic  CHF (congestive heart failure) Lasix  drip stopped on 11/6.  Dialysis started on 11/4.    Weight 284 with dialysis. Hypotension limiting medications for heart failure. Dialysis to manage fluid  Midodrine prior to dialysis for hypotension  Torsemide  on nondialysis days.   Acute kidney injury superimposed on CKD --> CKDV / renal failure  started on hemodialysis on 11/4.   Nephrology following   Patient will be on Tuesday Thursday Saturday dialysis schedule.   at this point waiting on outpatient dialysis chair before ok for discharge   Paroxysmal atrial fibrillation  Continue amiodarone  and Eliquis .   Hypothyroidism Continue Synthroid    Gout Continue allopurinol .   Acute lower UTI Ruled out.  Urine culture negative Monitor as needed   Constipation - resolved Patient had quite a few bowel movements the other day.   MiraLAX .   Hypoxia Previously had a pulse ox of 89% on 2 L of oxygen.   CPAP at night for sleep apnea.  off oxygen during the day.   Weakness Physical therapy recommending rehab.   Continue working with PT and OT.   Conjunctivitis Completed Ciloxan  eyedrop   Pancreatic insufficiency Continue Creon .   Type 2 diabetes mellitus with chronic kidney disease, without long-term current use of insulin  (HCC) Hemoglobin A1c 7.4.   Continue to monitor with diet   Dyslipidemia Continue statin therapy.      Class 2 obesity based on BMI: Body mass index is 38.53 kg/m.SABRA Significantly low or high BMI is associated with higher medical risk.  Underweight - under 18  overweight - 25 to 29 obese - 30 or more Class 1 obesity: BMI of 30.0 to 34 Class 2  obesity: BMI of 35.0 to 39 Class 3 obesity: BMI of 40.0 to 49 Super Morbid Obesity: BMI 50-59 Super-super Morbid Obesity: BMI 60+ Healthy nutrition and physical activity advised as adjunct to other disease management and risk reduction treatments    DVT prophylaxis: Eliquis  IV fluids: no continuous IV fluids   Nutrition: renal Central lines / other devices: HD cath  Code Status: FULL CODE ACP documentation reviewed:  none on file in [facility]  TOC needs: placement, outpatient HD Medical barriers to dispo: none. Expected medical readiness for discharge once outpatient dialysis plan is in place.               Subjective / Brief ROS:  Patient reports no concerns today Denies CP/SOB.  Pain controlled.    Family Communication: none at this time     Objective Findings:  Vitals:   01/06/24 1130 01/06/24 1146 01/06/24 1148 01/06/24 1251  BP: 103/79 107/86 115/81 106/71  Pulse: 75 80 79 77  Resp: 20 (!) 24 (!) 27 (!) 26  Temp:  97.8 F (36.6 C)  97.9 F (36.6 C)  TempSrc:  Oral    SpO2: 97% 98% 98% 94%  Weight:   124.7 kg   Height:        Intake/Output Summary (Last 24 hours) at 01/06/2024 1408 Last data filed at 01/06/2024 1148 Gross per 24 hour  Intake --  Output 3025 ml  Net -3025 ml   Filed Weights   01/06/24 0531 01/06/24 0753 01/06/24 1148  Weight: 125 kg 126.7 kg 124.7 kg    Examination:  Physical Exam Constitutional:      General: He is not in acute distress. Cardiovascular:     Rate and Rhythm: Normal rate and regular rhythm.  Pulmonary:     Effort: Pulmonary effort is normal.     Breath sounds: Normal breath sounds.  Abdominal:     Palpations: Abdomen is soft.  Neurological:     Mental Status: He is alert.  Psychiatric:        Mood and Affect: Mood normal.        Behavior: Behavior normal.          Scheduled Medications:   (feeding supplement) PROSource Plus  30 mL Oral TID BM   allopurinol   100 mg Oral Daily   amiodarone   200 mg Oral Daily   apixaban   5 mg Oral BID   atorvastatin   80 mg Oral Daily   Chlorhexidine  Gluconate Cloth  6 each Topical Q0600   feeding supplement (NEPRO CARB STEADY)  237 mL Oral BID BM   levothyroxine   125 mcg Oral Q0600   lidocaine   1 patch Transdermal Q12H   lipase/protease/amylase  36,000 Units Oral TID  AC   midodrine  10 mg Oral Q T,Th,Sa-HD   multivitamin  1 tablet Oral QHS   polyethylene glycol  17 g Oral Daily   torsemide   100 mg Oral Q M,W,F    Continuous Infusions:   PRN Medications:  acetaminophen  **OR** acetaminophen , magnesium  hydroxide, methocarbamol , ondansetron  **OR** ondansetron  (ZOFRAN ) IV, mouth rinse, traZODone   Antimicrobials from admission:  Anti-infectives (From admission, onward)    Start     Dose/Rate Route Frequency Ordered Stop   12/28/23 1530  ceFAZolin  (ANCEF ) IVPB 2g/100 mL premix  Status:  Discontinued        2 g 200 mL/hr over 30 Minutes Intravenous 30 min pre-op 12/28/23 1006 12/28/23 1107   12/28/23 1530  ceFAZolin  (ANCEF ) IVPB 1 g/50 mL premix  1 g 100 mL/hr over 30 Minutes Intravenous 30 min pre-op 12/28/23 1107 12/28/23 1137   12/24/23 0130  cefTRIAXone  (ROCEPHIN ) 2 g in sodium chloride  0.9 % 100 mL IVPB  Status:  Discontinued        2 g 200 mL/hr over 30 Minutes Intravenous Every 24 hours 12/24/23 0121 12/25/23 0950           Data Reviewed:  I have personally reviewed the following...  CBC: Recent Labs  Lab 01/01/24 1205 01/06/24 0813  WBC 6.1 6.7  HGB 11.2* 11.4*  HCT 34.5* 35.2*  MCV 86.9 87.1  PLT 197 243   Basic Metabolic Panel: Recent Labs  Lab 01/02/24 0514 01/03/24 0427 01/04/24 0410 01/05/24 0548 01/06/24 0516  NA 132* 132* 131* 132* 132*  K 3.7 3.9 3.8 4.0 4.2  CL 96* 94* 92* 95* 95*  CO2 26 27 24 26 27   GLUCOSE 116* 117* 95 98 79  BUN 36* 49* 59* 39* 47*  CREATININE 3.81* 5.00* 5.66* 4.38* 5.05*  CALCIUM  8.4* 8.4* 8.7* 8.6* 8.6*  PHOS 3.8 4.2 3.9 3.5 4.1   GFR: Estimated Creatinine Clearance: 21.2 mL/min (A) (by C-G formula based on SCr of 5.05 mg/dL (H)). Liver Function Tests: Recent Labs  Lab 01/02/24 0514 01/03/24 0427 01/04/24 0410 01/05/24 0548 01/06/24 0516  ALBUMIN  3.1* 3.4* 3.4* 3.5 3.5   No results for input(s): LIPASE, AMYLASE in the last 168 hours. No results for input(s):  AMMONIA in the last 168 hours. Coagulation Profile: No results for input(s): INR, PROTIME in the last 168 hours. Cardiac Enzymes: No results for input(s): CKTOTAL, CKMB, CKMBINDEX, TROPONINI in the last 168 hours. BNP (last 3 results) No results for input(s): PROBNP in the last 8760 hours. HbA1C: No results for input(s): HGBA1C in the last 72 hours. CBG: No results for input(s): GLUCAP in the last 168 hours. Lipid Profile: No results for input(s): CHOL, HDL, LDLCALC, TRIG, CHOLHDL, LDLDIRECT in the last 72 hours. Thyroid  Function Tests: No results for input(s): TSH, T4TOTAL, FREET4, T3FREE, THYROIDAB in the last 72 hours. Anemia Panel: No results for input(s): VITAMINB12, FOLATE, FERRITIN, TIBC, IRON , RETICCTPCT in the last 72 hours. Most Recent Urinalysis On File:     Component Value Date/Time   COLORURINE YELLOW (A) 12/23/2023 1940   APPEARANCEUR CLEAR (A) 12/23/2023 1940   APPEARANCEUR Clear 02/26/2014 1126   LABSPEC 1.007 12/23/2023 1940   LABSPEC 1.008 02/26/2014 1126   PHURINE 7.0 12/23/2023 1940   GLUCOSEU >=500 (A) 12/23/2023 1940   GLUCOSEU Negative 02/26/2014 1126   HGBUR MODERATE (A) 12/23/2023 1940   BILIRUBINUR NEGATIVE 12/23/2023 1940   BILIRUBINUR Negative 02/26/2014 1126   KETONESUR NEGATIVE 12/23/2023 1940   PROTEINUR 100 (A) 12/23/2023 1940   NITRITE NEGATIVE 12/23/2023 1940   LEUKOCYTESUR SMALL (A) 12/23/2023 1940   LEUKOCYTESUR 1+ 02/26/2014 1126   Sepsis Labs: @LABRCNTIP (procalcitonin:4,lacticidven:4) Microbiology: No results found for this or any previous visit (from the past 240 hours).    Radiology Studies last 3 days: DG Chest Port 1 View Result Date: 01/05/2024 CLINICAL DATA:  Screening for pulmonary TB. EXAM: PORTABLE CHEST 1 VIEW COMPARISON:  Chest x-ray 12/23/2023. FINDINGS: The heart is enlarged. Right-sided central venous catheter tip ends in the distal SVC. There is central pulmonary  vascular congestion. There is no focal lung infiltrate, pleural effusion or pneumothorax. No acute osseous abnormality. IMPRESSION: 1. Cardiomegaly with central pulmonary vascular congestion. 2. No focal lung infiltrate. Electronically Signed   By: Greig Pique M.D.   On: 01/05/2024  18:[address]       Laneta Blunt, DO [facility] 01/06/2024, 2:08 PM    Dictation software may have been used to generate the above note. Typos may occur and escape review in typed/dictated notes. Please contact Dr Blunt directly for clarity if needed.  Staff may message me via secure chat in Epic  but this may not receive an immediate response,  please page me for urgent matters!  If 7PM-7AM, please contact night coverage [email or web address]

## 2024-01-06 NOTE — Discharge Summary (Signed)
 Physician Discharge Summary   Patient: Hector [name] MRN: [id number]  DOB: [birth year]   Admit:     Date of Admission: 12/23/2023 Admitted from: home   Discharge: Date of discharge: 01/06/24 Disposition: Skilled nursing facility Condition at discharge: good  CODE STATUS: FULL CODE     Discharge Physician: Laneta Blunt, DO [facility]     PCP: [name], MD  Recommendations for Outpatient Follow-up:  Follow up with PCP Kandis, Stefano Iles, MD in 2-4 weeks Follow as directed for dialysis     Discharge Instructions     Diet - low sodium heart healthy   Complete by: As directed    Increase activity slowly   Complete by: As directed          Discharge Diagnoses: Principal Problem:   Acute on chronic systolic CHF (congestive heart failure) (HCC) Active Problems:   Acute kidney injury superimposed on CKD   Paroxysmal atrial fibrillation (HCC)   Hypothyroidism   Gout   Obesity (BMI 30-39.9)   Acute lower UTI   Dyslipidemia   Type 2 diabetes mellitus with chronic kidney disease, without long-term current use of insulin  [facility])   Pancreatic insufficiency   Encounter for dialysis and dialysis catheter care   Conjunctivitis   Weakness   Hypoxia   Constipation       Hospital Course: Hospital course / significant events:  Hector [name] is a 58 y.o. 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  Patient also had 50 pounds of weight gain over the past few months. Patient was given IV Lasix , consulted nephrology. Changed to Lasix  infusion 11/1. Patient had a  worsening renal function after Lasix , permacath was placed 11/4, hemodialysis started.   10/30: admission to hospitalist  11/01: lasix  infusion  11/02-11/03: worsening renal fxn  11/04: permcath placed 11/05.  Patient seen at the end of hemodialysis.  Only remove 300 mL secondary to low blood pressure. 11/06.  Patient seen before hemodialysis.  Physical therapy will recommend rehab.  TOC consulted. 11/07.  Patient is constipated and ordered lactulose  and MiraLAX  for constipation. 11/08.  Case discussed with nephrology and will give midodrine prior to dialysis. 11/10.  Encouraged to get up and move around with physical therapy.  Patient must get stronger. 11/11.  Patient sleepy while on dialysis.  He only sleeps during dialysis.  Creatinine 5.66. 11/12: at this point waiting on outpatient dialysis chair before ok for discharge  11/13: TOC confirms placement / HD      Consultants:  Vascular surgery   Procedures/Surgeries: 11/04: Insertion of tunneled dialysis catheter right IJ approach with ultrasound and fluoroscopic guidance -Dr Jama       ASSESSMENT & PLAN:   Acute on chronic systolic CHF (congestive heart failure) Lasix  drip stopped on 11/6.  Dialysis started on 11/4.    Weight 284 with dialysis. Hypotension limiting medications for heart failure. Dialysis to manage fluid  Midodrine prior to dialysis for hypotension  Torsemide  on nondialysis days.   Acute kidney injury superimposed on CKD --> CKDV / renal failure  started on hemodialysis on 11/4.   Nephrology following   Patient will be on Tuesday Thursday Saturday dialysis schedule.  TOC confirms outpatient dialysis at [facility]   Paroxysmal atrial fibrillation  Continue amiodarone  and Eliquis .   Hypothyroidism Continue Synthroid    Gout Continue allopurinol .   Acute lower UTI Ruled out.  Urine culture negative Monitor as needed   Constipation - resolved Patient had quite a few bowel movements the other  day.   MiraLAX .   Hypoxia Previously had a pulse ox of 89% on 2 L of oxygen.   CPAP at night for sleep apnea.  off oxygen during the day.   Weakness Physical therapy recommending rehab.   Continue working with PT and OT.   Conjunctivitis Completed Ciloxan  eyedrop   Pancreatic insufficiency Continue Creon .   Type 2 diabetes mellitus with chronic kidney disease, without long-term current use of insulin  (HCC) Hemoglobin A1c 7.4.   Continue to monitor with diet   Dyslipidemia Continue statin therapy.      Class 2 obesity based on BMI: Body mass index is 38.53 kg/m.SABRA Significantly low or high BMI is associated with higher medical risk.  Underweight - under 18  overweight - 25 to 29 obese - 30 or more Class 1 obesity: BMI of 30.0 to 34 Class 2 obesity: BMI of 35.0 to 39 Class 3 obesity: BMI of 40.0 to 49 Super Morbid Obesity: BMI 50-59 Super-super Morbid Obesity: BMI 60+ Healthy nutrition and physical activity advised as adjunct to other disease management and risk reduction treatments               Discharge Instructions  Allergies as of 01/06/2024       Reactions   Esomeprazole Magnesium  Other (See Comments)   Suspected interstitial nephritis 2018   Egg Protein-containing Drug Products Rash        Medication List     STOP taking these medications    amoxicillin-clavulanate 875-125 MG tablet Commonly known as: AUGMENTIN   calcitRIOL 0.25 MCG capsule Commonly known as: ROCALTROL   glipiZIDE 5 MG 24 hr tablet Commonly known as: GLUCOTROL XL       TAKE these medications    (feeding supplement) PROSource Plus liquid Take 30 mLs by mouth 3 (three) times daily between meals.   feeding supplement (NEPRO CARB STEADY) Liqd Take 237 mLs by mouth 2 (two) times daily between meals. Start taking on: January 07, 2024   acetaminophen  325 MG tablet Commonly known as: TYLENOL  Take 2 tablets (650 mg total) by mouth every 6 (six) hours as needed  for mild pain (pain score 1-3) or fever (or Fever >/= 101).   allopurinol  100 MG tablet Commonly known as: ZYLOPRIM  Take 100 mg by mouth daily.   amiodarone  200 MG tablet Commonly known as: PACERONE  Take 1 tablet (200 mg total) by mouth daily.   apixaban  5 MG Tabs tablet Commonly known as: ELIQUIS  Take 1 tablet (5 mg total) by mouth 2 (two) times daily.   atorvastatin  80 MG tablet Commonly known as: LIPITOR  Take 1 tablet (80 mg total) by mouth daily.   levothyroxine  125 MCG tablet Commonly known as: SYNTHROID  Take 125 mcg by mouth daily before breakfast.   lidocaine  5 % Commonly known as: Lidoderm  Place 1 patch onto the skin every 12 (twelve) hours. Remove & Discard patch within 12 hours or as directed by MD   lipase/protease/amylase 63999 UNITS Cpep capsule Commonly known as: CREON  Take 1 capsule (36,000 Units total) by mouth 3 (three) times daily before meals.   methocarbamol  500 MG tablet Commonly known as: ROBAXIN  Take 1 tablet (500 mg total) by mouth  every 8 (eight) hours as needed.   midodrine 10 MG tablet Commonly known as: PROAMATINE Take 1 tablet (10 mg total) by mouth Every Tuesday,Thursday,and Saturday with dialysis. Start taking on: January 08, 2024   multivitamin Tabs tablet Take 1 tablet by mouth at bedtime.   ondansetron  4 MG tablet Commonly known as: ZOFRAN  Take 1 tablet (4 mg total) by mouth every 6 (six) hours as needed for nausea.   polyethylene glycol 17 g packet Commonly known as: MIRALAX  / GLYCOLAX  Take 17 g by mouth daily as needed for mild constipation.   torsemide  100 MG tablet Commonly known as: DEMADEX  Take 1 tablet (100 mg total) by mouth every Monday, Wednesday, and Friday. Start taking on: January 07, 2024   traZODone  50 MG tablet Commonly known as: DESYREL  Take 0.5 tablets (25 mg total) by mouth at bedtime as needed for sleep.         Contact information for follow-up providers     [facility]. Go on 01/12/2024.   Specialty: Cardiology Why: Hospital Follow-Up 01/12/2024 @ 8:30 AM Please bring all medications to the follow-up appointment with you. [facility], [address], Second Floor Free Valet Parking at the [employer] information: [address] [place]  [address] [phone]        Dialysis, [facility] Follow up on 01/04/2024.   Why: Pt days for dialysis are Tuesday, Thrusday, Friday with a chair time of 6:15am.  Pt is to arrive at 5:45am for first day of treatment for new pat paperwork. Contact information: [address] [place] [address] [phone]              Contact information for after-discharge care     Destination     [facility] .   Service: Skilled Nursing Contact information: [address] [place]  [address] [phone]                     Allergies  Allergen Reactions   Esomeprazole Magnesium  Other (See Comments)    Suspected interstitial nephritis 2018   Egg Protein-Containing Drug Products Rash     Subjective: pt has no complaints today, denies CP/SOB, reports he is tired following dislyais    Discharge Exam: BP 91/72 (BP Location: Right Arm)   Pulse 82   Temp 98.2 F (36.8 C)   Resp (!) 26   Ht 5' 11 (1.803 m)   Wt 124.7 kg   SpO2 98%   BMI 38.34 kg/m  General: Pt is alert, awake, not in acute distress Cardiovascular: RRR, S1/S2 +, no rubs, no gallops Respiratory: CTA bilaterally, no wheezing, no rhonchi Abdominal: Soft, NT, ND, bowel sounds + Extremities: no edema, no cyanosis     The results of significant diagnostics from this hospitalization (including imaging, microbiology, ancillary and laboratory) are listed below for reference.     Microbiology: No results found for this or any previous visit (from the past 240 hours).   Labs: BNP (last 3 results) Recent Labs     08/06/23 1540 12/16/23 1230 12/23/23 1651  BNP 715.7* 538.0* 552.3*   Basic Metabolic Panel: Recent Labs  Lab 01/02/24 0514 01/03/24 0427 01/04/24 0410 01/05/24 0548 01/06/24 0516  NA 132* 132* 131* 132* 132*  K 3.7 3.9 3.8 4.0 4.2  CL 96* 94* 92* 95* 95*  CO2 26 27 24 26 27   GLUCOSE 116* 117* 95 98 79  BUN 36* 49* 59* 39* 47*  CREATININE 3.81* 5.00* 5.66* 4.38* 5.05*  CALCIUM  8.4* 8.4* 8.7* 8.6* 8.6*  PHOS 3.8 4.2 3.9 3.5 4.1   Liver Function Tests: Recent Labs  Lab 01/02/24 0514 01/03/24 0427 01/04/24 0410 01/05/24 0548 01/06/24 0516  ALBUMIN  3.1* 3.4* 3.4* 3.5 3.5   No results for input(s): LIPASE, AMYLASE in the last 168 hours. No results for input(s): AMMONIA in the last 168 hours. CBC: Recent Labs  Lab 01/01/24 1205 01/06/24 0813  WBC 6.1 6.7  HGB 11.2* 11.4*  HCT 34.5* 35.2*  MCV 86.9 87.1  PLT 197 243   Cardiac Enzymes: No results for input(s): CKTOTAL, CKMB, CKMBINDEX, TROPONINI in the last 168 hours. BNP: Invalid input(s): POCBNP CBG: No results for input(s): GLUCAP in the last 168 hours. D-Dimer No results for input(s): DDIMER in the last 72 hours. Hgb A1c No results for input(s): HGBA1C in the last 72 hours. Lipid Profile No results for input(s): CHOL, HDL, LDLCALC, TRIG, CHOLHDL, LDLDIRECT in the last 72 hours. Thyroid  function studies No results for input(s): TSH, T4TOTAL, T3FREE, THYROIDAB in the last 72 hours.  Invalid input(s): FREET3 Anemia work up No results for input(s): VITAMINB12, FOLATE, FERRITIN, TIBC, IRON , RETICCTPCT in the last 72 hours. Urinalysis    Component Value Date/Time   COLORURINE YELLOW (A) 12/23/2023 1940   APPEARANCEUR CLEAR (A) 12/23/2023 1940   APPEARANCEUR Clear 02/26/2014 1126   LABSPEC 1.007 12/23/2023 1940   LABSPEC 1.008 02/26/2014 1126   PHURINE 7.0 12/23/2023 1940   GLUCOSEU >=500 (A) 12/23/2023 1940   GLUCOSEU Negative 02/26/2014 1126    HGBUR MODERATE (A) 12/23/2023 1940   BILIRUBINUR NEGATIVE 12/23/2023 1940   BILIRUBINUR Negative 02/26/2014 1126   KETONESUR NEGATIVE 12/23/2023 1940   PROTEINUR 100 (A) 12/23/2023 1940   NITRITE NEGATIVE 12/23/2023 1940   LEUKOCYTESUR SMALL (A) 12/23/2023 1940   LEUKOCYTESUR 1+ 02/26/2014 1126   Sepsis Labs Recent Labs  Lab 01/01/24 1205 01/06/24 0813  WBC 6.1 6.7   Microbiology No results found for this or any previous visit (from the past 240 hours). Imaging DG Chest 2 View Result Date: 12/23/2023 EXAM: 2 VIEW(S) XRAY OF THE CHEST 12/23/2023 09:11:00 PM COMPARISON: 12/16/2022, 07/28/2023 CLINICAL HISTORY: Shortness of breath FINDINGS: LUNGS AND PLEURA: No focal pulmonary opacity. Vascular congestion and pulmonary edema appear worse. No sizable pleural effusion. No pneumothorax. HEART AND MEDIASTINUM: Cardiomegaly appears worse. No acute abnormality of the mediastinal silhouette. BONES AND SOFT TISSUES: No acute osseous abnormality. IMPRESSION: 1. Cardiomegaly with vascular congestion and pulmonary edema, worsened in appearance. 2. No sizable pleural effusion. Electronically signed by: Luke Bun MD 12/23/2023 09:17 PM EDT RP Workstation: [id number]      Time coordinating discharge: over 30 minutes  SIGNED:  Salem Lembke DO [facility]

## 2024-01-06 NOTE — Progress Notes (Signed)
 [facility]  ROUNDING NOTE   Subjective:   Hector [name] is a 58 y.o. male with past medical conditions including hypertension, paroxysmal A-fib on Eliquis , CHF with a EF less than 20%, and chronic kidney disease stage IV. Patient presents to ED with shortness of breath and edema.  Patient currently admitted for Acute on chronic systolic CHF (congestive heart failure) (HCC) [I50.23] Acute on chronic congestive heart failure, unspecified heart failure type [name]) [I50.9]  Patient known to our practice from previous admissions and is followed by [name] nephrology outpatient.    Update Patient seen and evaluated during dialysis   HEMODIALYSIS FLOWSHEET:  Blood Flow Rate (mL/min): 199 mL/min Arterial Pressure (mmHg): -87.67 mmHg Venous Pressure (mmHg): 104.64 mmHg TMP (mmHg): 12.73 mmHg Ultrafiltration Rate (mL/min): 985 mL/min Dialysate Flow Rate (mL/min): 300 ml/min Dialysis Fluid Bolus: Normal Saline Bolus Amount (mL): 200 mL  Tolerating treatment No complaints.    Objective:  Vital signs in last 24 hours:  Temp:  [97.7 F (36.5 C)-98.8 F (37.1 C)] 97.8 F (36.6 C) (11/13 1146) Pulse Rate:  [73-91] 79 (11/13 1148) Resp:  [15-27] 27 (11/13 1148) BP: (96-121)/(76-96) 115/81 (11/13 1148) SpO2:  [95 %-100 %] 98 % (11/13 1148) Weight:  [124.7 kg-126.7 kg] 124.7 kg (11/13 1148)  Weight change: -4 kg Filed Weights   01/06/24 0531 01/06/24 0753 01/06/24 1148  Weight: 125 kg 126.7 kg 124.7 kg    Intake/Output: I/O last 3 completed shifts: In: 360 [P.O.:360] Out: 1450 [Urine:1450]   Intake/Output this shift:  Total I/O In: -  Out: 2175 [Urine:175; Other:2000]  Physical Exam: General: NAD  Head: Normocephalic, atraumatic. Moist oral mucosal membranes  Eyes: Anicteric  Lungs:  Clear to auscultation, normal effort  Heart: Regular rate and rhythm  Abdomen:  Soft, nontender, distended  Extremities: 1+ peripheral edema.  Neurologic: Awake and alert   Skin: Warm,dry, no rash  Access Rt internal jugular permcath  Basic Metabolic Panel: Recent Labs  Lab 01/02/24 0514 01/03/24 0427 01/04/24 0410 01/05/24 0548 01/06/24 0516  NA 132* 132* 131* 132* 132*  K 3.7 3.9 3.8 4.0 4.2  CL 96* 94* 92* 95* 95*  CO2 26 27 24 26 27   GLUCOSE 116* 117* 95 98 79  BUN 36* 49* 59* 39* 47*  CREATININE 3.81* 5.00* 5.66* 4.38* 5.05*  CALCIUM  8.4* 8.4* 8.7* 8.6* 8.6*  PHOS 3.8 4.2 3.9 3.5 4.1    Liver Function Tests: Recent Labs  Lab 01/02/24 0514 01/03/24 0427 01/04/24 0410 01/05/24 0548 01/06/24 0516  ALBUMIN  3.1* 3.4* 3.4* 3.5 3.5   No results for input(s): LIPASE, AMYLASE in the last 168 hours.  No results for input(s): AMMONIA in the last 168 hours.  CBC: Recent Labs  Lab 01/01/24 1205 01/06/24 0813  WBC 6.1 6.7  HGB 11.2* 11.4*  HCT 34.5* 35.2*  MCV 86.9 87.1  PLT 197 243    Cardiac Enzymes: No results for input(s): CKTOTAL, CKMB, CKMBINDEX, TROPONINI in the last 168 hours.  BNP: Invalid input(s): POCBNP  CBG: No results for input(s): GLUCAP in the last 168 hours.  Microbiology: Results for orders placed or performed during the [name] encounter of 12/23/23  Urine Culture (for pregnant, neutropenic or urologic patients or patients with an indwelling urinary catheter)     Status: Abnormal   Collection Time: 12/23/23  7:40 PM   Specimen: Urine, Catheterized  Result Value Ref Range Status   Specimen Description   Final    URINE, CATHETERIZED Performed at [name], [address], [place], [place] [address]    Special Requests   Final    NONE Performed at [name], [address] [place], [place] [address]    Culture (A)  Final    <10,000 COLONIES/mL INSIGNIFICANT GROWTH Performed at [name], [address] [place], [place] [address]    Report Status 12/25/2023 FINAL  Final    Coagulation Studies: No results for input(s): LABPROT, INR in the  last 72 hours.  Urinalysis: No results for input(s): COLORURINE, LABSPEC, PHURINE, GLUCOSEU, HGBUR, BILIRUBINUR, KETONESUR, PROTEINUR, UROBILINOGEN, NITRITE, LEUKOCYTESUR in the last 72 hours.  Invalid input(s): APPERANCEUR     Imaging: DG Chest Port 1 View Result Date: 01/05/2024 CLINICAL DATA:  Screening for pulmonary TB. EXAM: PORTABLE CHEST 1 VIEW COMPARISON:  Chest x-ray 12/23/2023. FINDINGS: The heart is enlarged. Right-sided central venous catheter tip ends in the distal SVC. There is central pulmonary vascular congestion. There is no focal lung infiltrate, pleural effusion or pneumothorax. No acute osseous abnormality. IMPRESSION: 1. Cardiomegaly with central pulmonary vascular congestion. 2. No focal lung infiltrate. Electronically Signed   By: Hector [name] M.D.   On: 01/05/2024 18:43       Medications:      (feeding supplement) PROSource Plus  30 mL Oral TID BM   allopurinol   100 mg Oral Daily   amiodarone   200 mg Oral Daily   apixaban   5 mg Oral BID   atorvastatin   80 mg Oral Daily   Chlorhexidine  Gluconate Cloth  6 each Topical Q0600   feeding supplement (NEPRO CARB STEADY)  237 mL Oral BID BM   levothyroxine   125 mcg Oral Q0600   lidocaine   1 patch Transdermal Q12H   lipase/protease/amylase  36,000 Units Oral TID AC   midodrine  10 mg Oral Q T,Th,Sa-HD   multivitamin  1 tablet Oral QHS   polyethylene glycol  17 g Oral Daily   torsemide   100 mg Oral Q M,W,F   acetaminophen  **OR** acetaminophen , magnesium  hydroxide, methocarbamol , ondansetron  **OR** ondansetron  (ZOFRAN ) IV, mouth rinse, traZODone   Assessment/ Plan:  [name] is a 58 y.o.  male with past medical conditions including hypertension, paroxysmal A-fib on Eliquis , CHF with a EF less than 20%, and chronic kidney disease stage IV.  Patient currently admitted for Acute on chronic systolic CHF (congestive heart failure) (HCC) [I50.23] Acute on chronic congestive heart  failure, unspecified heart failure type (HCC) [I50.9]   End stage renal disease on hemodialysis. Due to progressive decline in renal function, we feel patient is now considered stage V, end stage renal disease.   Due to persistent kidney failure and multiple readmissions due to volume overload, patient was agreeable to proceed with renal replacement therapy. Appreciate vascular surgery placing Rt internal jugular permcath.  Receiving dialysis today, UF goal 2L as tolerated. Receiving Midodrine 10mg  with dialysis.  Continue torsemide  100mg  on nondialysis days.  Appreciate dialysis navigator confirming outpatient clinic at [facility] on a TTS schedule.    Lab Results  Component Value Date   CREATININE 5.05 (H) 01/06/2024   CREATININE 4.38 (H) 01/05/2024   CREATININE 5.66 (H) 01/04/2024    Intake/Output Summary (Last 24 hours) at 01/06/2024 1241 Last data filed at 01/06/2024 1148 Gross per 24 hour  Intake --  Output 3175 ml  Net -3175 ml    2. Anemia of chronic kidney disease Lab Results  Component Value Date   HGB 11.4 (L) 01/06/2024  Hgb within optimal range.  3.  Secondary Hyperparathyroidism: with outpatient labs: PTH 1207, phosphorus 5.6, calcium  9.4 on 12/07/2023.   Lab Results  Component Value Date   CALCIUM  8.6 (L) 01/06/2024   CAION 1.13 (L) 06/23/2023   CAION 1.10 (L) 06/23/2023   PHOS 4.1 01/06/2024    Will continue to monitor calcium  and phos during this admission.  4. 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 Home regimen includes glipizide. Most recent hemoglobin A1c is 7.3 on 06/28/2023.   Diet controlled   LOS: 13 Stormy Connon 11/13/202512:41 PM

## 2024-01-06 NOTE — TOC Transition Note (Signed)
 Transition of Care [facility]) - Discharge Note   Patient Details  Name: Hector [name] MRN: [id number] Date of Birth: 25-Jun-1965  Transition of Care [facility] CM/SW Contact:  Alfonso Rummer, LCSW Phone Number: 01/06/2024, 4:53 PM   Clinical Narrative:    Pt is discharging [facility]. Pt brother contacted via phone left messge. Brianan with [facility] confirmed pt dialysis appts are scheduled. No further toc needs.    Final next level of care: Skilled Nursing Facility Barriers to Discharge: Barriers Resolved   Patient Goals and CMS Choice            Discharge Placement PASRR number recieved: 01/06/24 ([id number])            Patient chooses bed at: Other - please specify in the comment section below: ([facility]) Patient to be transferred to facility by: [facility] Name of family member notified: craig Sainvil Patient and family notified of of transfer: 01/06/24  Discharge Plan and Services Additional resources added to the After Visit Summary for       Post Acute Care Choice: NA                               Social Drivers of Health (SDOH) Interventions SDOH Screenings   Food Insecurity: No Food Insecurity (12/16/2023)  Housing: Unknown (12/16/2023)  Transportation Needs: No Transportation Needs (12/16/2023)  Utilities: Not At Risk (12/16/2023)  Alcohol Screen: Low Risk  (01/19/2023)  Depression (PHQ2-9): Low Risk  (08/19/2021)  Financial Resource Strain: Low Risk  (01/19/2023)  Physical Activity: Inactive (01/04/2019)  Social Connections: Unknown (12/16/2023)  Stress: No Stress Concern Present (01/04/2019)  Tobacco Use: Medium Risk (12/23/2023)     Readmission Risk Interventions    12/27/2023    3:10 PM 04/27/2022   11:48 AM 02/24/2022    3:53 PM  Readmission Risk Prevention Plan  Transportation Screening Complete Complete Complete  Medication Review Oceanographer) Complete Complete Complete  PCP or Specialist  appointment within 3-5 days of discharge Complete Complete   SW Recovery Care/Counseling Consult Complete Complete Complete  Palliative Care Screening Not Applicable Not Applicable Not Applicable  Skilled Nursing Facility Not Applicable Not Applicable Not Applicable

## 2024-01-06 NOTE — Progress Notes (Signed)
 OT Cancellation Note  Patient Details Name: Hector [name] MRN: [id number] DOB: 1965-09-21   Cancelled Treatment:    Reason Eval/Treat Not Completed: Patient at procedure or test/ unavailable. Pt in dialysis. Will re-attempt OT tx at later date/time as pt is available.   Lore Polka R., MPH, MS, OTR/L ascom [phone] 01/06/24, 7:57 AM

## 2024-01-07 DIAGNOSIS — I5023 Acute on chronic systolic (congestive) heart failure: Secondary | ICD-10-CM | POA: Diagnosis not present

## 2024-01-07 LAB — RENAL FUNCTION PANEL
Albumin: 3.5 g/dL (ref 3.5–5.0)
Anion gap: 12 (ref 5–15)
BUN: 36 mg/dL — ABNORMAL HIGH (ref 6–20)
CO2: 26 mmol/L (ref 22–32)
Calcium: 8.7 mg/dL — ABNORMAL LOW (ref 8.9–10.3)
Chloride: 94 mmol/L — ABNORMAL LOW (ref 98–111)
Creatinine, Ser: 4.52 mg/dL — ABNORMAL HIGH (ref 0.61–1.24)
GFR, Estimated: 14 mL/min — ABNORMAL LOW (ref >60)
Glucose, Bld: 85 mg/dL (ref 70–99)
Phosphorus: 3.1 mg/dL (ref 2.5–4.6)
Potassium: 4.2 mmol/L (ref 3.5–5.1)
Sodium: 132 mmol/L — ABNORMAL LOW (ref 135–145)

## 2024-01-07 MED ORDER — TORSEMIDE 20 MG PO TABS: 50.0000 mg | ORAL_TABLET | ORAL | Status: DC

## 2024-01-07 MED ORDER — TORSEMIDE 10 MG PO TABS
50.0000 mg | ORAL_TABLET | ORAL | Status: AC
Start: 2024-01-10 — End: ?

## 2024-01-07 MED ORDER — BISACODYL 10 MG RE SUPP
10.0000 mg | Freq: Once | RECTAL | Status: AC
  Administered 2024-01-07: 10 mg via RECTAL
  Filled 2024-01-07: qty 1

## 2024-01-07 MED ORDER — TORSEMIDE 20 MG PO TABS
50.0000 mg | ORAL_TABLET | ORAL | Status: DC
  Administered 2024-01-07: 50 mg via ORAL

## 2024-01-07 MED ORDER — TORSEMIDE 10 MG PO TABS: 50.0000 mg | ORAL_TABLET | ORAL | Status: DC

## 2024-01-07 NOTE — Progress Notes (Signed)
 Report provided to [facility] assigned nurse at [facility].All questions addressed at time of discharge.

## 2024-01-07 NOTE — Progress Notes (Signed)
 [facility]  ROUNDING NOTE   Subjective:   Hector [name] is a 58 y.o. male with past medical conditions including hypertension, paroxysmal A-fib on Eliquis , CHF with a EF less than 20%, and chronic kidney disease stage IV. Patient presents to ED with shortness of breath and edema.  Patient currently admitted for Acute on chronic systolic CHF (congestive heart failure) (HCC) [I50.23] Acute on chronic congestive heart failure, unspecified heart failure type [facility]) [I50.9]  Patient known to our practice from previous admissions and is followed by [facility] nephrology outpatient.    Update Patient seen sitting at side of bed Alert and oriented Remains on room air Generalized edema greatly improved   Objective:  Vital signs in last 24 hours:  Temp:  [97.6 F (36.4 C)-98.4 F (36.9 C)] 97.8 F (36.6 C) (11/14 0813) Pulse Rate:  [68-88] 82 (11/14 0813) Resp:  [16-27] 16 (11/14 0813) BP: (91-115)/(48-89) 98/79 (11/14 0813) SpO2:  [94 %-99 %] 97 % (11/14 0813) Weight:  [124.7 kg-127.5 kg] 127.5 kg (11/14 0420)  Weight change: 1.7 kg Filed Weights   01/06/24 0753 01/06/24 1148 01/07/24 0420  Weight: 126.7 kg 124.7 kg 127.5 kg    Intake/Output: I/O last 3 completed shifts: In: 180 [P.O.:180] Out: 2625 [Urine:625; Other:2000]   Intake/Output this shift:  No intake/output data recorded.  Physical Exam: General: NAD  Head: Normocephalic, atraumatic. Moist oral mucosal membranes  Eyes: Anicteric  Lungs:  Clear to auscultation, normal effort  Heart: Regular rate and rhythm  Abdomen:  Soft, nontender, distended  Extremities: 1+ peripheral edema.  Neurologic: Awake and alert  Skin: Warm,dry, no rash  Access Rt internal jugular permcath  Basic Metabolic Panel: Recent Labs  Lab 01/03/24 0427 01/04/24 0410 01/05/24 0548 01/06/24 0516 01/07/24 0508  NA 132* 131* 132* 132* 132*  K 3.9 3.8 4.0 4.2 4.2  CL 94* 92* 95* 95* 94*  CO2 27 24 26 27 26   GLUCOSE 117* 95  98 79 85  BUN 49* 59* 39* 47* 36*  CREATININE 5.00* 5.66* 4.38* 5.05* 4.52*  CALCIUM  8.4* 8.7* 8.6* 8.6* 8.7*  PHOS 4.2 3.9 3.5 4.1 3.1    Liver Function Tests: Recent Labs  Lab 01/03/24 0427 01/04/24 0410 01/05/24 0548 01/06/24 0516 01/07/24 0508  ALBUMIN  3.4* 3.4* 3.5 3.5 3.5   No results for input(s): LIPASE, AMYLASE in the last 168 hours.  No results for input(s): AMMONIA in the last 168 hours.  CBC: Recent Labs  Lab 01/01/24 1205 01/06/24 0813  WBC 6.1 6.7  HGB 11.2* 11.4*  HCT 34.5* 35.2*  MCV 86.9 87.1  PLT 197 243    Cardiac Enzymes: No results for input(s): CKTOTAL, CKMB, CKMBINDEX, TROPONINI in the last 168 hours.  BNP: Invalid input(s): POCBNP  CBG: No results for input(s): GLUCAP in the last 168 hours.  Microbiology: Results for orders placed or performed during the hospital encounter of 12/23/23  Urine Culture (for pregnant, neutropenic or urologic patients or patients with an indwelling urinary catheter)     Status: Abnormal   Collection Time: 12/23/23  7:40 PM   Specimen: Urine, Catheterized  Result Value Ref Range Status   Specimen Description   Final    URINE, CATHETERIZED Performed at [facility], [address], [place], [place] [address]    Special Requests   Final    NONE Performed at [facility], [address] [place], [place] [address]    Culture (A)  Final    <10,000 COLONIES/mL INSIGNIFICANT GROWTH  Performed at [facility], [address], [place], [place] [address]    Report Status 12/25/2023 FINAL  Final    Coagulation Studies: No results for input(s): LABPROT, INR in the last 72 hours.  Urinalysis: No results for input(s): COLORURINE, LABSPEC, PHURINE, GLUCOSEU, HGBUR, BILIRUBINUR, KETONESUR, PROTEINUR, UROBILINOGEN, NITRITE, LEUKOCYTESUR in the last 72 hours.  Invalid input(s): APPERANCEUR     Imaging: DG Chest Port 1 View Result Date:  01/05/2024 CLINICAL DATA:  Screening for pulmonary TB. EXAM: PORTABLE CHEST 1 VIEW COMPARISON:  Chest x-ray 12/23/2023. FINDINGS: The heart is enlarged. Right-sided central venous catheter tip ends in the distal SVC. There is central pulmonary vascular congestion. There is no focal lung infiltrate, pleural effusion or pneumothorax. No acute osseous abnormality. IMPRESSION: 1. Cardiomegaly with central pulmonary vascular congestion. 2. No focal lung infiltrate. Electronically Signed   By: Greig Pique M.D.   On: 01/05/2024 18:43       Medications:      (feeding supplement) PROSource Plus  30 mL Oral TID BM   allopurinol   100 mg Oral Daily   amiodarone   200 mg Oral Daily   apixaban   5 mg Oral BID   atorvastatin   80 mg Oral Daily   Chlorhexidine  Gluconate Cloth  6 each Topical Q0600   feeding supplement (NEPRO CARB STEADY)  237 mL Oral BID BM   levothyroxine   125 mcg Oral Q0600   lidocaine   1 patch Transdermal Q12H   lipase/protease/amylase  36,000 Units Oral TID AC   midodrine  10 mg Oral Q T,Th,Sa-HD   multivitamin  1 tablet Oral QHS   polyethylene glycol  17 g Oral Daily   torsemide   50 mg Oral Q M,W,F   acetaminophen  **OR** acetaminophen , magnesium  hydroxide, methocarbamol , ondansetron  **OR** ondansetron  (ZOFRAN ) IV, mouth rinse, traZODone   Assessment/ Plan:  Hector [name] is a 58 y.o.  male with past medical conditions including hypertension, paroxysmal A-fib on Eliquis , CHF with a EF less than 20%, and chronic kidney disease stage IV.  Patient currently admitted for Acute on chronic systolic CHF (congestive heart failure) (HCC) [I50.23] Acute on chronic congestive heart failure, unspecified heart failure type (HCC) [I50.9]   End stage renal disease on hemodialysis. Due to progressive decline in renal function, we feel patient is now considered stage V, end stage renal disease.   Due to persistent kidney failure and multiple readmissions due to volume overload, patient  was agreeable to proceed with renal replacement therapy. Appreciate vascular surgery placing Rt internal jugular permcath.  Receiving Midodrine 10mg  with dialysis.  Continue torsemide  100mg  on nondialysis days.  Dialysis received yesterday, UF 2L achieved. Next treatment scheduled for Saturday. Appreciate dialysis navigator confirming outpatient clinic at [facility] on a TTS schedule.    Lab Results  Component Value Date   CREATININE 4.52 (H) 01/07/2024   CREATININE 5.05 (H) 01/06/2024   CREATININE 4.38 (H) 01/05/2024    Intake/Output Summary (Last 24 hours) at 01/07/2024 1128 Last data filed at 01/07/2024 0947 Gross per 24 hour  Intake 180 ml  Output 2100 ml  Net -1920 ml    2. Anemia of chronic kidney disease Lab Results  Component Value Date   HGB 11.4 (L) 01/06/2024  Hgb stable, no need for ESA  3. Secondary Hyperparathyroidism: with outpatient labs: PTH 1207, phosphorus 5.6, calcium  9.4 on 12/07/2023.   Lab Results  Component Value Date   CALCIUM  8.7 (L) 01/07/2024   CAION 1.13 (L) 06/23/2023   CAION 1.10 (L)  06/23/2023   PHOS 3.1 01/07/2024    Calcium  and phos within optimal range  4. 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 Home regimen includes glipizide. Most recent hemoglobin A1c is 7.3 on 06/28/2023.   Diet controlled   LOS: 14 Cally Nygard 11/14/202511:28 AM

## 2024-01-07 NOTE — Progress Notes (Signed)
 D/C order noted.  Contacted [facility] TTS 6:15 advised of pt and d/c. Pt will be going to SN[facility] for rehab. Requested documents faxed to clinic for continuation of care.  Suzen Satchel Dialysis Navigator [phone]

## 2024-01-07 NOTE — TOC Progression Note (Signed)
 Transition of Care [facility]) - Progression Note    Patient Details  Name: Hector [name] MRN: [id number] Date of Birth: [birth year]  Transition [facility]) CM/SW Contact  Marinda Cooks, RN Phone Number: 01/07/2024, 10:13 AM  Clinical Narrative:    This CM alerted by MD and bedside RN pt had a BM and is medically cleared to dc to SNF of choice [facility] . This CM placed call to admission Liaison Briana and was sent to VM , detailed message left with this CM contact info. TOC will cnt to flow dc planning / care coordination and update as applicable.    Expected Discharge Plan and Services  SNF for rehab    Post Acute Care Choice: NA Living arrangements for the past 2 months: Single Family Home Expected Discharge Date: 01/06/24                   Social Drivers of Health (SDOH) Interventions SDOH Screenings   Food Insecurity: No Food Insecurity (12/16/2023)  Housing: Unknown (12/16/2023)  Transportation Needs: No Transportation Needs (12/16/2023)  Utilities: Not At Risk (12/16/2023)  Alcohol Screen: Low Risk  (01/19/2023)  Depression (PHQ2-9): Low Risk  (08/19/2021)  Financial Resource Strain: Low Risk  (01/19/2023)  Physical Activity: Inactive (01/04/2019)  Social Connections: Unknown (12/16/2023)  Stress: No Stress Concern Present (01/04/2019)  Tobacco Use: Medium Risk (12/23/2023)    Readmission Risk Interventions    12/27/2023    3:10 PM 04/27/2022   11:48 AM 02/24/2022    3:53 PM  Readmission Risk Prevention Plan  Transportation Screening Complete Complete Complete  Medication Review Oceanographer) Complete Complete Complete  PCP or Specialist appointment within 3-5 days of discharge Complete Complete   SW Recovery Care/Counseling Consult Complete Complete Complete  Palliative Care Screening Not Applicable Not Applicable Not Applicable  Skilled Nursing Facility Not Applicable Not Applicable Not Applicable

## 2024-01-07 NOTE — TOC Transition Note (Signed)
 Transition of Care [facility]) - Discharge Note   Patient Details  Name: Hector [name] MRN: [id number] Date of Birth: [birth year]  Transition [facility]) CM/SW Contact:  Marinda Cooks, RN Phone Number: 01/07/2024, 11:41 AM   Clinical Narrative:    This CM updated by covering MD pt medically cleared to dc today and has active DC order . This CM spoke with Brianna Admission liaison at [facility] and confirmed pt's bed offer and that he can be rec'd today. DC transportation confirmed for pt with [facility] team updated . No additional DC needs requested by medical team or identified by CM at this time .     Final next level of care: Skilled Nursing Facility Barriers to Discharge: Barriers Resolved   Patient Goals and CMS Choice     Choice offered to / list presented to : Patient      Discharge Placement PASRR number recieved: 01/06/24 ([id number])            Patient chooses bed at: Other - please specify in the comment section below: ([facility]) Patient to be transferred to facility by: Ambulance Name of family member notified: Zymeir Salminen  Brother    [phone] Patient and family notified of of transfer: 01/07/24  Discharge Plan and Services Additional resources added to the After Visit Summary for       Post Acute Care Choice: NA                               Social Drivers of Health (SDOH) Interventions SDOH Screenings   Food Insecurity: No Food Insecurity (12/16/2023)  Housing: Unknown (12/16/2023)  Transportation Needs: No Transportation Needs (12/16/2023)  Utilities: Not At Risk (12/16/2023)  Alcohol Screen: Low Risk  (01/19/2023)  Depression (PHQ2-9): Low Risk  (08/19/2021)  Financial Resource Strain: Low Risk  (01/19/2023)  Physical Activity: Inactive (01/04/2019)  Social Connections: Unknown (12/16/2023)  Stress: No Stress Concern Present (01/04/2019)  Tobacco Use: Medium Risk (12/23/2023)     Readmission Risk  Interventions    12/27/2023    3:10 PM 04/27/2022   11:48 AM 02/24/2022    3:53 PM  Readmission Risk Prevention Plan  Transportation Screening Complete Complete Complete  Medication Review Oceanographer) Complete Complete Complete  PCP or Specialist appointment within 3-5 days of discharge Complete Complete   SW Recovery Care/Counseling Consult Complete Complete Complete  Palliative Care Screening Not Applicable Not Applicable Not Applicable  Skilled Nursing Facility Not Applicable Not Applicable Not Applicable

## 2024-01-07 NOTE — Discharge Summary (Signed)
 Physician Discharge Summary   Patient: Hector [name] MRN: [id number]  DOB: 1966/01/07   Admit:     Date of Admission: 12/23/2023 Admitted from: home   Discharge: Date of discharge: 01/07/24 (was ready yesterday medically but facility did not accept him utnil today) Disposition: Skilled nursing facility Condition at discharge: good  CODE STATUS: FULL CODE     Discharge Physician: Laneta Blunt, DO [facility]     PCP: [name], MD  Recommendations for Outpatient Follow-up:  Follow up with PCP Kandis, Stefano Iles, MD in 2-4 weeks Follow as directed for dialysis     Discharge Instructions     Diet - low sodium heart healthy   Complete by: As directed    Increase activity slowly   Complete by: As directed          Discharge Diagnoses: Principal Problem:   Acute on chronic systolic CHF (congestive heart failure) (HCC) Active Problems:   Acute kidney injury superimposed on CKD   Paroxysmal atrial fibrillation (HCC)   Hypothyroidism   Gout   Obesity (BMI 30-39.9)   Acute lower UTI   Dyslipidemia   Type 2 diabetes mellitus with chronic kidney disease, without long-term current use of insulin  [facility])   Pancreatic insufficiency   Encounter for dialysis and dialysis catheter care   Conjunctivitis   Weakness   Hypoxia   Constipation        Hospital course / significant events:  Hector [name] is a 58 y.o. 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  Patient also had 50 pounds of weight gain over the past few months. Patient was given IV Lasix , consulted  nephrology. Changed to Lasix  infusion 11/1. Patient had a worsening renal function after Lasix , permacath was placed 11/4, hemodialysis started.   10/30: admission to hospitalist  11/01: lasix  infusion  11/02-11/03: worsening renal fxn  11/04: permcath placed 11/05.  Patient seen at the end of hemodialysis.  Only remove 300 mL secondary to low blood pressure. 11/06.  Patient seen before hemodialysis.  Physical therapy will recommend rehab.  TOC consulted. 11/07.  Patient is constipated and ordered lactulose  and MiraLAX  for constipation. 11/08.  Case discussed with nephrology and will give midodrine prior to dialysis. 11/10.  Encouraged to get up and move around with physical therapy.  Patient must get stronger. 11/11.  Patient sleepy while on dialysis.  He only sleeps during dialysis.  Creatinine 5.66. 11/12: at this point waiting on outpatient dialysis chair before ok for discharge  11/13: TOC confirms placement / HD but facility would  not take him this evening 11/14: good to go      Consultants:  Vascular surgery   Procedures/Surgeries: 11/04: Insertion of tunneled dialysis catheter right IJ approach with ultrasound and fluoroscopic guidance -Dr Jama       ASSESSMENT & PLAN:   Acute on chronic systolic CHF (congestive heart failure) Lasix  drip stopped on 11/6.  Dialysis started on 11/4.    Weight 284 with dialysis. Hypotension limiting medications for heart failure. Dialysis to manage fluid  Midodrine prior to dialysis for hypotension  Torsemide  on nondialysis days.   Acute kidney injury superimposed on CKD --> CKDV /  renal failure  started on hemodialysis on 11/4.   Nephrology following   Patient will be on Tuesday Thursday Saturday dialysis schedule.   TOC confirms outpatient dialysis at [facility]   Paroxysmal atrial fibrillation  Continue amiodarone  and Eliquis .   Hypothyroidism Continue Synthroid    Gout Continue allopurinol .   Acute lower UTI Ruled out.   Urine culture negative Monitor as needed   Constipation - resolved Patient had quite a few bowel movements the other day.   MiraLAX .   Hypoxia Previously had a pulse ox of 89% on 2 L of oxygen.   CPAP at night for sleep apnea.  off oxygen during the day.   Weakness Physical therapy recommending rehab.   Continue working with PT and OT.   Conjunctivitis Completed Ciloxan  eyedrop   Pancreatic insufficiency Continue Creon .   Type 2 diabetes mellitus with chronic kidney disease, without long-term current use of insulin  (HCC) Hemoglobin A1c 7.4.   Continue to monitor with diet   Dyslipidemia Continue statin therapy.      Class 2 obesity based on BMI: Body mass index is 38.53 kg/m.SABRA Significantly low or high BMI is associated with higher medical risk.  Underweight - under 18  overweight - 25 to 29 obese - 30 or more Class 1 obesity: BMI of 30.0 to 34 Class 2 obesity: BMI of 35.0 to 39 Class 3 obesity: BMI of 40.0 to 49 Super Morbid Obesity: BMI 50-59 Super-super Morbid Obesity: BMI 60+ Healthy nutrition and physical activity advised as adjunct to other disease management and risk reduction treatments               Discharge Instructions  Allergies as of 01/07/2024       Reactions   Esomeprazole Magnesium  Other (See Comments)   Suspected interstitial nephritis 2018   Egg Protein-containing Drug Products Rash        Medication List     STOP taking these medications    amoxicillin-clavulanate 875-125 MG tablet Commonly known as: AUGMENTIN   calcitRIOL 0.25 MCG capsule Commonly known as: ROCALTROL   glipiZIDE 5 MG 24 hr tablet Commonly known as: GLUCOTROL XL       TAKE these medications    (feeding supplement) PROSource Plus liquid Take 30 mLs by mouth 3 (three) times daily between meals.   feeding supplement (NEPRO CARB STEADY) Liqd Take 237 mLs by mouth 2 (two) times daily between meals.   acetaminophen  325 MG tablet Commonly  known as: TYLENOL  Take 2 tablets (650 mg total) by mouth every 6 (six) hours as needed for mild pain (pain score 1-3) or fever (or Fever >/= 101).   allopurinol  100 MG tablet Commonly known as: ZYLOPRIM  Take 100 mg by mouth daily.   amiodarone  200 MG tablet Commonly known as: PACERONE  Take 1 tablet (200 mg total) by mouth daily.   apixaban  5 MG Tabs tablet Commonly known as: ELIQUIS  Take 1 tablet (5 mg total) by mouth 2 (two) times daily.   atorvastatin  80 MG tablet Commonly known as: LIPITOR  Take 1 tablet (80 mg total) by mouth daily.   levothyroxine  125 MCG tablet Commonly known as: SYNTHROID  Take 125 mcg by mouth daily before breakfast.   lidocaine  5 % Commonly known as: Lidoderm  Place 1 patch onto the skin every 12 (twelve) hours. Remove & Discard patch within 12 hours or as directed by MD   lipase/protease/amylase 63999 UNITS Cpep capsule Commonly known as: CREON  Take 1 capsule (36,000 Units total) by mouth 3 (three) times daily before  meals.   methocarbamol  500 MG tablet Commonly known as: ROBAXIN  Take 1 tablet (500 mg total) by mouth every 8 (eight) hours as needed.   midodrine 10 MG tablet Commonly known as: PROAMATINE Take 1 tablet (10 mg total) by mouth Every Tuesday,Thursday,and Saturday with dialysis. Start taking on: January 08, 2024   multivitamin Tabs tablet Take 1 tablet by mouth at bedtime.   ondansetron  4 MG tablet Commonly known as: ZOFRAN  Take 1 tablet (4 mg total) by mouth every 6 (six) hours as needed for nausea.   polyethylene glycol 17 g packet Commonly known as: MIRALAX  / GLYCOLAX  Take 17 g by mouth daily as needed for mild constipation.   torsemide  10 MG tablet Commonly known as: DEMADEX  Take 5 tablets (50 mg total) by mouth every Monday, Wednesday, and Friday. Start taking on: January 10, 2024   torsemide  10 MG tablet Commonly known as: DEMADEX  Take 5 tablets (50 mg total) by mouth every Monday, Wednesday, and Friday. Start  taking on: January 10, 2024   traZODone  50 MG tablet Commonly known as: DESYREL  Take 0.5 tablets (25 mg total) by mouth at bedtime as needed for sleep.          Contact information for follow-up providers     [facility]. Go on 01/12/2024.   Specialty: Cardiology Why: Hospital Follow-Up 01/12/2024 @ 8:30 AM Please bring all medications to the follow-up appointment with you. [facility], [address], Second Floor Free Valet Parking at the [employer] information: [address] [place]  [address] [phone]        Dialysis, [facility] Follow up on 01/04/2024.   Why: Pt days for dialysis are Tuesday, Thrusday, Friday with a chair time of 6:15am.  Pt is to arrive at 5:45am for first day of treatment for new pat paperwork. Contact information: [address] [place] [address] [phone]              Contact information for after-discharge care     Destination     [facility] .   Service: Skilled Nursing Contact information: [address] [place]  [address] [phone]                     Allergies  Allergen Reactions   Esomeprazole Magnesium  Other (See Comments)    Suspected interstitial nephritis 2018   Egg Protein-Containing Drug Products Rash     Subjective: pt has no complaints today, denies CP/SOB    Discharge Exam: BP 98/79 (BP Location: Right Arm)   Pulse 82   Temp 97.8 F (36.6 C) (Oral)   Resp 16   Ht 5' 11 (1.803 m)   Wt 127.5 kg   SpO2 97%   BMI 39.20 kg/m  General: Pt is alert, awake, not in acute distress Cardiovascular: RRR, S1/S2 +, no rubs, no gallops Respiratory: CTA bilaterally, no wheezing, no rhonchi Abdominal: Soft, NT, ND, bowel sounds + Extremities: no edema, no cyanosis     The results of significant diagnostics from this hospitalization (including imaging,  microbiology, ancillary and laboratory) are listed below for reference.     Microbiology: No results found for this or any previous visit (from the past 240 hours).   Labs: BNP (last 3 results) Recent Labs    08/06/23 1540 12/16/23 1230 12/23/23 1651  BNP 715.7* 538.0* 552.3*   Basic Metabolic Panel: Recent Labs  Lab 01/03/24 0427  01/04/24 0410 01/05/24 0548 01/06/24 0516 01/07/24 0508  NA 132* 131* 132* 132* 132*  K 3.9 3.8 4.0 4.2 4.2  CL 94* 92* 95* 95* 94*  CO2 27 24 26 27 26   GLUCOSE 117* 95 98 79 85  BUN 49* 59* 39* 47* 36*  CREATININE 5.00* 5.66* 4.38* 5.05* 4.52*  CALCIUM  8.4* 8.7* 8.6* 8.6* 8.7*  PHOS 4.2 3.9 3.5 4.1 3.1   Liver Function Tests: Recent Labs  Lab 01/03/24 0427 01/04/24 0410 01/05/24 0548 01/06/24 0516 01/07/24 0508  ALBUMIN  3.4* 3.4* 3.5 3.5 3.5   No results for input(s): LIPASE, AMYLASE in the last 168 hours. No results for input(s): AMMONIA in the last 168 hours. CBC: Recent Labs  Lab 01/01/24 1205 01/06/24 0813  WBC 6.1 6.7  HGB 11.2* 11.4*  HCT 34.5* 35.2*  MCV 86.9 87.1  PLT 197 243   Cardiac Enzymes: No results for input(s): CKTOTAL, CKMB, CKMBINDEX, TROPONINI in the last 168 hours. BNP: Invalid input(s): POCBNP CBG: No results for input(s): GLUCAP in the last 168 hours. D-Dimer No results for input(s): DDIMER in the last 72 hours. Hgb A1c No results for input(s): HGBA1C in the last 72 hours. Lipid Profile No results for input(s): CHOL, HDL, LDLCALC, TRIG, CHOLHDL, LDLDIRECT in the last 72 hours. Thyroid  function studies No results for input(s): TSH, T4TOTAL, T3FREE, THYROIDAB in the last 72 hours.  Invalid input(s): FREET3 Anemia work up No results for input(s): VITAMINB12, FOLATE, FERRITIN, TIBC, IRON , RETICCTPCT in the last 72 hours. Urinalysis    Component Value Date/Time   COLORURINE YELLOW (A) 12/23/2023 1940   APPEARANCEUR CLEAR (A) 12/23/2023 1940    APPEARANCEUR Clear 02/26/2014 1126   LABSPEC 1.007 12/23/2023 1940   LABSPEC 1.008 02/26/2014 1126   PHURINE 7.0 12/23/2023 1940   GLUCOSEU >=500 (A) 12/23/2023 1940   GLUCOSEU Negative 02/26/2014 1126   HGBUR MODERATE (A) 12/23/2023 1940   BILIRUBINUR NEGATIVE 12/23/2023 1940   BILIRUBINUR Negative 02/26/2014 1126   KETONESUR NEGATIVE 12/23/2023 1940   PROTEINUR 100 (A) 12/23/2023 1940   NITRITE NEGATIVE 12/23/2023 1940   LEUKOCYTESUR SMALL (A) 12/23/2023 1940   LEUKOCYTESUR 1+ 02/26/2014 1126   Sepsis Labs Recent Labs  Lab 01/01/24 1205 01/06/24 0813  WBC 6.1 6.7   Microbiology No results found for this or any previous visit (from the past 240 hours). Imaging DG Chest 2 View Result Date: 12/23/2023 EXAM: 2 VIEW(S) XRAY OF THE CHEST 12/23/2023 09:11:00 PM COMPARISON: 12/16/2022, 07/28/2023 CLINICAL HISTORY: Shortness of breath FINDINGS: LUNGS AND PLEURA: No focal pulmonary opacity. Vascular congestion and pulmonary edema appear worse. No sizable pleural effusion. No pneumothorax. HEART AND MEDIASTINUM: Cardiomegaly appears worse. No acute abnormality of the mediastinal silhouette. BONES AND SOFT TISSUES: No acute osseous abnormality. IMPRESSION: 1. Cardiomegaly with vascular congestion and pulmonary edema, worsened in appearance. 2. No sizable pleural effusion. Electronically signed by: Luke Bun MD 12/23/2023 09:17 PM EDT RP Workstation: [id number]      Time coordinating discharge: over 30 minutes  SIGNED:  Radames Mejorado DO [facility]

## 2024-01-11 ENCOUNTER — Telehealth: Payer: Self-pay | Admitting: Family

## 2024-01-11 NOTE — Telephone Encounter (Signed)
 Called to confirm/remind patient of their appointment at [facility] on 01/12/24.   Appointment:   [] Confirmed  [x] Left mess   [] No answer/No voice mail  [] VM Full/unable to leave message  [] Phone not in service  Patient reminded to bring all medications and/or complete list.  Confirmed patient has transportation. Gave directions, instructed to utilize valet parking.

## 2024-01-11 NOTE — Progress Notes (Unsigned)
 ADVANCED HF CLINIC NOTE   Primary Care: Hector Stefano Iles, MD Primary Cardiologist: Hector Lunger, MD  HP provider: Cherrie Sieving, MD   Chief Complaint: shortness of breath   HPI:  [name] is a 58 y.o. male with ETOH use, DM2, morbid obesity, PAF (h/o GIB on apixaban ),  OSA on CPAP, CKD stage IV (Scr ~3.3), medication noncompliance.    HF dates back to at least 2009, thought to presumably be nonischemic in the setting of alcohol use, with an EF of 20%.     Echo 2016 EF 20-25%. LHC 12/2014 minimal CAD   EF initially improved with restoration of NSR but then dropped back down    He underwent TEE/DCCV in 02/2021 at that time showed an EF of less than 20% with severely reduced RV systolic function.  He was reevaluated by EP in 04/2021 and felt not to be a good candidate for ICD or ablation given medication noncompliance.   Admitted in 9/23 & 10/23 with ADHF. Limited echo 10/23 EF <  20% with moderate RV dysfunction and moderate to sever MR.  He was discharged from the hospital on 12/12/2021 with a weight of 247 pounds.     Admitted 11/23, 12/23, 1/24 and 3/24 with recurrent HF.   In 3/24 underwent TEE-guided DCCV as well as RHC. TEE EF < 20%, mod RV HK, mod MR. RHC with markedly elevated filling pressures and low CO. RA 17, PA 58/39 (43), PCWP 33, Fick 3.4/1.5 (PA sat 49%), PAPi 1.2.   Seen in ER 05/18/22 with recurrent volume overload. Lasix  switched to torsemide  and metolazone  added.  Admitted 01/16/23 due to abdominal pain and found to be tachycardic. CT scan of the abdomen without contrast did not find any findings in the abdomen or pelvis to explain the patient's abdominal pain, cholelithiasis, renal cysts. Worsening renal function so diuretic held and IVF given. Cardiology and nephrology consults done. Was in atrial flutter with successful cardioversion. Given amiodarone .   Echo 01/19/23: EF <20% with mild MR, no thrombus  Admitted 07/28/23 with syncope. Found to be  in a fib RVR. VQ + for possible RLL PE. Resp panel + for coronavirus OC43. Echo 07/29/23 EF 20-25% RV ok. Loaded with IV amiodarone . Refused lifevest, no driving for 6 months. Cardioverted to NSR. TEE 07/30/23: EF <20%, severely reduced RV, no thrombus, mild RAE. SCr >4 at discharge. GDMT limited with renal function and hyperkalemia. Was supposed to discharge with a Zio but failed to get it placed prior to d/c.   Seen in [facility] 06/25 and was given an extra 2.5mg  metolazone  due to edema.  Patient refused lifevest.   Zio 6/25: min HR of 58 bpm, max HR of 171 bpm, and avg HR of 77 bpm. Predominant underlying rhythm was Sinus Rhythm. Bundle Branch Block/IVCD was present. 2 Supraventricular Tachycardia runs occurred, the run with the fastest interval lasting 17.9 secs with a max rate of 171 bpm (avg 122 bpm); the run with the fastest interval was also the longest. Isolated SVEs were occasional (2.2%, 11891), SVE Couplets were rare (<1.0%, 140), and SVE Triplets were rare (<1.0%, 8). Isolated VEs were frequent (6.2%, 34299), VE Couplets were rare (<1.0%, 861), and VE Triplets were rare (<1.0%, 85). Ventricular Bigeminy and Trigeminy were present.  Admitted 12/16/23 with 2 weeks of progressive abdominal pain/ distention along with markedly distended abdomen with shifting dullness. Worsening renal function with creatinine up to 5.78. CT abdomen was negative for obstruction. Nephrology consulted, possible dialysis in near future.  Antibiotics given for UTI. Discharged home.   Admitted 12/23/23 with acute onset of worsening dyspnea with associated lower extremity edema, orthopnea and paroxysmal nocturnal dyspnea along with 50 pound weight gain. IV diuresed, nephrology consulted. Was then started on lasix  infusion. Permacath placed 12/28/23 and HD started. Will be getting outpatient dialysis on T, Th, Sat. Discharged to SNF.  Today he returns for HF follow up with a chief complaint of minimal shortness of breath. Has  associated fatigue, back pain, pedal edema. Wearing oxygen at 3L around the clock. Keeping eyes closed during visit but answering questions appropriately. Says that PT has not started yet. Doing dialysis T, TH, Sat and gets midodrine on dialysis days only. Says that it's going ok.   ROS: All systems negative except what is listed in HPI, PMH and Problem List   Past Medical History:  Diagnosis Date   Alcohol abuse    Alcoholic cardiomyopathy (HCC)    a. 12/2007 MV: EF 28%, no isch;  b. 8/12 Echo: EF 25-35%; c. 02/2014 Echo: EF 20-25%; d. 12/2014 Cath: minimal CAD; e. 01/2015 Echo: EF 50-55%;  d. 05/2016 Echo: EF 30-35%, diff HK, gr2 DD; e. 11/2016 Echo: EF 45-50%, diff HK; f. 09/2021 Echo: EF <20%; g. 11/2021 Echo: EF<20%.   Chronic combined systolic (congestive) and diastolic (congestive) heart failure (HCC)    a. 05/2016 Echo: EF 30-35%; b. 11/2016 Echo: EF 45-50%, diff HK; c. 09/2021 Echo: EF < 20%; d. 11/2021 Echo: EF <20%, no rwma, Nl RV fxn, sev dil LA, mod-sev MR. mod dil PA w/ mildly elev PASP.   CKD (chronic kidney disease), stage III (HCC)    Elevated troponin (chronic)    Essential hypertension    GI bleed 11/2013   Hyperlipidemia    Pancreatitis    Paroxysmal A-fib (HCC)    a. new onset s/p unsuccessful TEE/DCCV on 08/16/2014; b. CHA2DS2VASc = 3-> eliquis  (freq noncompliant); c. 02/2021 s/p TEE/DCCV; d. Prev on amio->d/c 2/2 abnl TFTs.   Paroxysmal atrial flutter (HCC)    Sleep-disordered breathing    Has yet to have a sleep study   Stroke [facility])     Current Outpatient Medications  Medication Sig Dispense Refill   acetaminophen  (TYLENOL ) 325 MG tablet Take 2 tablets (650 mg total) by mouth every 6 (six) hours as needed for mild pain (pain score 1-3) or fever (or Fever >/= 101).     allopurinol  (ZYLOPRIM ) 100 MG tablet Take 100 mg by mouth daily.     amiodarone  (PACERONE ) 200 MG tablet Take 1 tablet (200 mg total) by mouth daily. 90 tablet 1   apixaban  (ELIQUIS ) 5 MG TABS tablet  Take 1 tablet (5 mg total) by mouth 2 (two) times daily.     atorvastatin  (LIPITOR ) 80 MG tablet Take 1 tablet (80 mg total) by mouth daily. 90 tablet 1   levothyroxine  (SYNTHROID ) 125 MCG tablet Take 125 mcg by mouth daily before breakfast.     lidocaine  (LIDODERM ) 5 % Place 1 patch onto the skin every 12 (twelve) hours. Remove & Discard patch within 12 hours or as directed by MD 10 patch 1   lipase/protease/amylase (CREON ) 36000 UNITS CPEP capsule Take 1 capsule (36,000 Units total) by mouth 3 (three) times daily before meals. (Patient not taking: No sig reported) 180 capsule 0   methocarbamol  (ROBAXIN ) 500 MG tablet Take 1 tablet (500 mg total) by mouth every 8 (eight) hours as needed. 30 tablet 0   midodrine (PROAMATINE) 10 MG tablet Take 1 tablet (  10 mg total) by mouth Every Tuesday,Thursday,and Saturday with dialysis.     multivitamin (RENA-VIT) TABS tablet Take 1 tablet by mouth at bedtime.     Nutritional Supplements (,FEEDING SUPPLEMENT, PROSOURCE PLUS) liquid Take 30 mLs by mouth 3 (three) times daily between meals.     Nutritional Supplements (FEEDING SUPPLEMENT, NEPRO CARB STEADY,) LIQD Take 237 mLs by mouth 2 (two) times daily between meals.     ondansetron  (ZOFRAN ) 4 MG tablet Take 1 tablet (4 mg total) by mouth every 6 (six) hours as needed for nausea.     polyethylene glycol (MIRALAX  / GLYCOLAX ) 17 g packet Take 17 g by mouth daily as needed for mild constipation. 30 each 0   torsemide  (DEMADEX ) 10 MG tablet Take 5 tablets (50 mg total) by mouth every Monday, Wednesday, and Friday.     traZODone  (DESYREL ) 50 MG tablet Take 0.5 tablets (25 mg total) by mouth at bedtime as needed for sleep.     No current facility-administered medications for this visit.   Allergies  Allergen Reactions   Esomeprazole Magnesium  Other (See Comments)    Suspected interstitial nephritis 2018   Egg Protein-Containing Drug Products Rash   Social History   Socioeconomic History   Marital status:  Single    Spouse name: Not on file   Number of children: Not on file   Years of education: Not on file   Highest education level: Not on file  Occupational History   Not on file  Tobacco Use   Smoking status: Former    Current packs/day: 0.00    Average packs/day: 1 pack/day for 12.0 years (12.0 ttl pk-yrs)    Types: Cigarettes    Start date: 02/23/1986    Quit date: 02/23/1998    Years since quitting: 25.8   Smokeless tobacco: Never  Vaping Use   Vaping status: Never Used  Substance and Sexual Activity   Alcohol use: Not Currently    Alcohol/week: 0.0 standard drinks of alcohol    Comment: Past heavy drinker   Drug use: No   Sexual activity: Not Currently  Other Topics Concern   Not on file  Social History Narrative   Not on file   Social Drivers of Health   Financial Resource Strain: Low Risk  (01/19/2023)   Overall Financial Resource Strain (CARDIA)    Difficulty of Paying Living Expenses: Not hard at all  Food Insecurity: No Food Insecurity (12/16/2023)   Hunger Vital Sign    Worried About Running Out of Food in the Last Year: Never true    Ran Out of Food in the Last Year: Never true  Transportation Needs: No Transportation Needs (12/16/2023)   PRAPARE - Administrator, Civil Service (Medical): No    Lack of Transportation (Non-Medical): No  Physical Activity: Inactive (01/04/2019)   Exercise Vital Sign    Days of Exercise per Week: 0 days    Minutes of Exercise per Session: 0 min  Stress: No Stress Concern Present (01/04/2019)   [employer] of Occupational Health - Occupational Stress Questionnaire    Feeling of Stress : Not at all  Social Connections: Unknown (12/16/2023)   Social Connection and Isolation Panel    Frequency of Communication with Friends and Family: More than three times a week    Frequency of Social Gatherings with Friends and Family: Once a week    Attends Religious Services: Never    Database Administrator or Organizations:  No  Attends Banker Meetings: Patient declined    Marital Status: Patient declined  Intimate Partner Violence: Not At Risk (12/16/2023)   Humiliation, Afraid, Rape, and Kick questionnaire    Fear of Current or Ex-Partner: No    Emotionally Abused: No    Physically Abused: No    Sexually Abused: No   Family History  Problem Relation Age of Onset   Hypertension Mother    Hyperlipidemia Mother    Diabetes Mother    Vitals:   01/12/24 0837 01/12/24 0855  BP: (!) 139/119 102/73  Pulse: 90   SpO2: 98%    Wt Readings from Last 3 Encounters:  01/07/24 281 lb 1.4 oz (127.5 kg)  12/19/23 (!) 300 lb 4.8 oz (136.2 kg)  12/10/23 273 lb (123.8 kg)   Lab Results  Component Value Date   CREATININE 4.52 (H) 01/07/2024   CREATININE 5.05 (H) 01/06/2024   CREATININE 4.38 (H) 01/05/2024    PHYSICAL EXAM:  General: Well appearing although disengaged in conversation with eyes closed most of visit. Dialysis catheter intact right upper chest wall Cor: No JVD. Irregular rhythm, normal rate.  Lungs: clear Abdomen: soft, nontender, nondistended. Extremities: trace pitting edema BLE Neuro:. Affect pleasant   ASSESSMENT & PLAN:  1. 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 Order written for facility to weigh daily - LHC 2016 minimal CAD - RHC 3/24 RA 17 PA 58/39 (43) PCWP 33 Fick 3.4/1.5 (PA sat 49%) PAPi 1.2  - Echo 01/19/23: EF <20% with mild MR, no thrombus - Echo 07/29/23 EF 20-25% RV ok - TEE 07/30/23: EF <20%, severely reduced RV, no thrombus, mild RAE - GDMT limited by CKD Stage V.  - Continue torsemide  50mg  daily on M, W, F  2. Paroxysmal atrial fibrillation/atrial flutter- - continue apixaban  5 mg twice daily for CHA2DS2-VASc score of at least 6. Denies abnormal bleeding -  continue amio 200mg  daily  - TEE/DC-CV 3/24  - DC-CV 11/24 - DC-CV 6/25 - TSH 07/28/23 was 39.399. Synthroid  dose managed by PCP at facility - needs yearly eye exams  CKD stage V- - now getting dialysis T, Th, Sat - gets midodrine 10mg  on dialysis days - saw nephrology Alphonse) 10/25 - BMET 01/07/24 reviewed: sodium 132, potassium 4.2, creatinine 4.52, GFR 14   4. Nonobstructive coronary artery disease- - Cath 2016 - continue atorvastatin  80mg  daily - LDL 11/04/23 was 131   5. Obstructive sleep apnea- - last sleep study done 12/2015 - wearing CPAP nightly / oxygen at 3L around the clock   6. Type 2 diabetes- -A1c 7.4% on 12/18/23   7. Syncopal episode x 2 (06/25) - suspect was related to AFL but also at high-risk for VT. VQ + for PE - Continue amiodarone  200 mg daily - no driving x 6 months - has previously not been an ICD candidate due to poor compliance - He previously cancelled last 2 EP appointments.    Follow up in 2 months then will transition to PRN appointments since he's on dialysis.   I spent 37 minutes reviewing records, interviewing/ examing patient and managing plan/ orders.   Ellouise DELENA Class  FNP-C  01/11/24

## 2024-01-12 ENCOUNTER — Encounter: Payer: Self-pay | Admitting: Family

## 2024-01-12 ENCOUNTER — Ambulatory Visit: Attending: Family | Admitting: Family

## 2024-01-12 VITALS — BP 102/73 | HR 90

## 2024-01-12 DIAGNOSIS — I480 Paroxysmal atrial fibrillation: Secondary | ICD-10-CM | POA: Diagnosis not present

## 2024-01-12 DIAGNOSIS — R0602 Shortness of breath: Secondary | ICD-10-CM | POA: Diagnosis present

## 2024-01-12 DIAGNOSIS — I5042 Chronic combined systolic (congestive) and diastolic (congestive) heart failure: Secondary | ICD-10-CM

## 2024-01-12 DIAGNOSIS — G4733 Obstructive sleep apnea (adult) (pediatric): Secondary | ICD-10-CM | POA: Diagnosis not present

## 2024-01-12 DIAGNOSIS — N186 End stage renal disease: Secondary | ICD-10-CM

## 2024-01-12 DIAGNOSIS — R609 Edema, unspecified: Secondary | ICD-10-CM | POA: Diagnosis not present

## 2024-01-12 DIAGNOSIS — Z992 Dependence on renal dialysis: Secondary | ICD-10-CM | POA: Diagnosis not present

## 2024-01-12 DIAGNOSIS — N185 Chronic kidney disease, stage 5: Secondary | ICD-10-CM | POA: Diagnosis not present

## 2024-01-12 DIAGNOSIS — Z7901 Long term (current) use of anticoagulants: Secondary | ICD-10-CM | POA: Diagnosis not present

## 2024-01-12 DIAGNOSIS — R5383 Other fatigue: Secondary | ICD-10-CM | POA: Diagnosis not present

## 2024-01-12 DIAGNOSIS — N184 Chronic kidney disease, stage 4 (severe): Secondary | ICD-10-CM

## 2024-01-12 DIAGNOSIS — E1122 Type 2 diabetes mellitus with diabetic chronic kidney disease: Secondary | ICD-10-CM | POA: Diagnosis not present

## 2024-01-12 DIAGNOSIS — Z79890 Hormone replacement therapy (postmenopausal): Secondary | ICD-10-CM | POA: Diagnosis not present

## 2024-01-12 DIAGNOSIS — Z9981 Dependence on supplemental oxygen: Secondary | ICD-10-CM | POA: Insufficient documentation

## 2024-01-12 DIAGNOSIS — I2510 Atherosclerotic heart disease of native coronary artery without angina pectoris: Secondary | ICD-10-CM

## 2024-01-12 DIAGNOSIS — Z91148 Patient's other noncompliance with medication regimen for other reason: Secondary | ICD-10-CM | POA: Diagnosis not present

## 2024-01-12 DIAGNOSIS — I4892 Unspecified atrial flutter: Secondary | ICD-10-CM | POA: Insufficient documentation

## 2024-01-12 DIAGNOSIS — Z87891 Personal history of nicotine dependence: Secondary | ICD-10-CM | POA: Diagnosis not present

## 2024-01-12 DIAGNOSIS — I132 Hypertensive heart and chronic kidney disease with heart failure and with stage 5 chronic kidney disease, or end stage renal disease: Secondary | ICD-10-CM | POA: Insufficient documentation

## 2024-01-12 DIAGNOSIS — Z79899 Other long term (current) drug therapy: Secondary | ICD-10-CM | POA: Diagnosis not present

## 2024-01-12 NOTE — Patient Instructions (Signed)
 It was good to see you today!

## 2024-02-10 ENCOUNTER — Ambulatory Visit: Admitting: Sleep Medicine

## 2024-03-14 ENCOUNTER — Telehealth: Payer: Self-pay | Admitting: Family

## 2024-03-14 NOTE — Telephone Encounter (Signed)
 Called to confirm/remind patient of their appointment at [facility] on 03/15/24.   Appointment:   [x] Confirmed  [] Left mess   [] No answer/No voice mail  [] VM Full/unable to leave message  [] Phone not in service  Patient reminded to bring all medications and/or complete list.  Confirmed patient has transportation. Gave directions, instructed to utilize valet parking.

## 2024-03-15 ENCOUNTER — Ambulatory Visit: Attending: Family | Admitting: Family

## 2024-03-15 ENCOUNTER — Encounter: Payer: Self-pay | Admitting: Family

## 2024-03-15 VITALS — BP 119/99 | HR 90 | Wt 280.6 lb

## 2024-03-15 DIAGNOSIS — G4733 Obstructive sleep apnea (adult) (pediatric): Secondary | ICD-10-CM | POA: Diagnosis not present

## 2024-03-15 DIAGNOSIS — Z79899 Other long term (current) drug therapy: Secondary | ICD-10-CM | POA: Diagnosis not present

## 2024-03-15 DIAGNOSIS — Z79890 Hormone replacement therapy (postmenopausal): Secondary | ICD-10-CM | POA: Diagnosis not present

## 2024-03-15 DIAGNOSIS — I480 Paroxysmal atrial fibrillation: Secondary | ICD-10-CM | POA: Diagnosis not present

## 2024-03-15 DIAGNOSIS — I5042 Chronic combined systolic (congestive) and diastolic (congestive) heart failure: Secondary | ICD-10-CM | POA: Insufficient documentation

## 2024-03-15 DIAGNOSIS — N186 End stage renal disease: Secondary | ICD-10-CM | POA: Diagnosis not present

## 2024-03-15 DIAGNOSIS — Z992 Dependence on renal dialysis: Secondary | ICD-10-CM | POA: Diagnosis not present

## 2024-03-15 DIAGNOSIS — R55 Syncope and collapse: Secondary | ICD-10-CM | POA: Diagnosis not present

## 2024-03-15 DIAGNOSIS — E1122 Type 2 diabetes mellitus with diabetic chronic kidney disease: Secondary | ICD-10-CM | POA: Diagnosis not present

## 2024-03-15 DIAGNOSIS — I4892 Unspecified atrial flutter: Secondary | ICD-10-CM | POA: Insufficient documentation

## 2024-03-15 DIAGNOSIS — Z7901 Long term (current) use of anticoagulants: Secondary | ICD-10-CM | POA: Diagnosis not present

## 2024-03-15 DIAGNOSIS — R0602 Shortness of breath: Secondary | ICD-10-CM | POA: Diagnosis present

## 2024-03-15 DIAGNOSIS — N185 Chronic kidney disease, stage 5: Secondary | ICD-10-CM | POA: Insufficient documentation

## 2024-03-15 DIAGNOSIS — I428 Other cardiomyopathies: Secondary | ICD-10-CM | POA: Insufficient documentation

## 2024-03-15 DIAGNOSIS — I2510 Atherosclerotic heart disease of native coronary artery without angina pectoris: Secondary | ICD-10-CM | POA: Diagnosis not present

## 2024-03-15 DIAGNOSIS — R600 Localized edema: Secondary | ICD-10-CM | POA: Diagnosis not present

## 2024-03-15 DIAGNOSIS — Z9981 Dependence on supplemental oxygen: Secondary | ICD-10-CM | POA: Insufficient documentation

## 2024-03-15 DIAGNOSIS — R5383 Other fatigue: Secondary | ICD-10-CM | POA: Insufficient documentation

## 2024-03-15 DIAGNOSIS — I447 Left bundle-branch block, unspecified: Secondary | ICD-10-CM | POA: Diagnosis not present

## 2024-03-15 DIAGNOSIS — I132 Hypertensive heart and chronic kidney disease with heart failure and with stage 5 chronic kidney disease, or end stage renal disease: Secondary | ICD-10-CM | POA: Insufficient documentation

## 2024-03-15 DIAGNOSIS — N184 Chronic kidney disease, stage 4 (severe): Secondary | ICD-10-CM | POA: Diagnosis not present

## 2024-03-15 DIAGNOSIS — Z91148 Patient's other noncompliance with medication regimen for other reason: Secondary | ICD-10-CM | POA: Insufficient documentation

## 2024-03-15 NOTE — Progress Notes (Signed)
 "  ADVANCED HF CLINIC NOTE   Primary Care: [name], MD Primary Cardiologist: Evalene Lunger, MD  HP provider: Cherrie Sieving, MD   Chief Complaint: minimal shortness of breath   HPI:  Hector [name] is a 59 y.o. male with ETOH use, DM2, morbid obesity, PAF (h/o GIB on apixaban ),  OSA on CPAP, CKD stage IV (Scr ~3.3), medication noncompliance.    HF dates back to at least 2009, thought to presumably be nonischemic in the setting of alcohol use, with an EF of 20%.     Echo 2016 EF 20-25%. LHC 12/2014 minimal CAD   EF initially improved with restoration of NSR but then dropped back down    He underwent TEE/DCCV in 02/2021 at that time showed an EF of less than 20% with severely reduced RV systolic function.  He was reevaluated by EP in 04/2021 and felt not to be a good candidate for ICD or ablation given medication noncompliance.   Admitted in 9/23 & 10/23 with ADHF. Limited echo 10/23 EF <  20% with moderate RV dysfunction and moderate to sever MR.  He was discharged from the hospital on 12/12/2021 with a weight of 247 pounds.     Admitted 11/23, 12/23, 1/24 and 3/24 with recurrent HF.   In 3/24 underwent TEE-guided DCCV as well as RHC. TEE EF < 20%, mod RV HK, mod MR. RHC with markedly elevated filling pressures and low CO. RA 17, PA 58/39 (43), PCWP 33, Fick 3.4/1.5 (PA sat 49%), PAPi 1.2.   Seen in ER 05/18/22 with recurrent volume overload. Lasix  switched to torsemide  and metolazone  added.  Admitted 01/16/23 due to abdominal pain and found to be tachycardic. CT scan of the abdomen without contrast did not find any findings in the abdomen or pelvis to explain the patient's abdominal pain, cholelithiasis, renal cysts. Worsening renal function so diuretic held and IVF given. Cardiology and nephrology consults done. Was in atrial flutter with successful cardioversion. Given amiodarone .   Echo 01/19/23: EF <20% with mild MR, no thrombus  Admitted 07/28/23 with syncope. Found  to be in a fib RVR. VQ + for possible RLL PE. Resp panel + for coronavirus OC43. Echo 07/29/23 EF 20-25% RV ok. Loaded with IV amiodarone . Refused lifevest, no driving for 6 months. Cardioverted to NSR. TEE 07/30/23: EF <20%, severely reduced RV, no thrombus, mild RAE. SCr >4 at discharge. GDMT limited with renal function and hyperkalemia. Was supposed to discharge with a Zio but failed to get it placed prior to d/c.   Seen in [facility] 06/25 and was given an extra 2.5mg  metolazone  due to edema.  Patient refused lifevest.   Zio 6/25: min HR of 58 bpm, max HR of 171 bpm, and avg HR of 77 bpm. Predominant underlying rhythm was Sinus Rhythm. Bundle Branch Block/IVCD was present. 2 Supraventricular Tachycardia runs occurred, the run with the fastest interval lasting 17.9 secs with a max rate of 171 bpm (avg 122 bpm); the run with the fastest interval was also the longest. Isolated SVEs were occasional (2.2%, 11891), SVE Couplets were rare (<1.0%, 140), and SVE Triplets were rare (<1.0%, 8). Isolated VEs were frequent (6.2%, 34299), VE Couplets were rare (<1.0%, 861), and VE Triplets were rare (<1.0%, 85). Ventricular Bigeminy and Trigeminy were present.  Admitted 12/16/23 with 2 weeks of progressive abdominal pain/ distention along with markedly distended abdomen with shifting dullness. Worsening renal function with creatinine up to 5.78. CT abdomen was negative for obstruction. Nephrology consulted, possible dialysis in  near future. Antibiotics given for UTI. Discharged home.   Admitted 12/23/23 with acute onset of worsening dyspnea with associated lower extremity edema, orthopnea and paroxysmal nocturnal dyspnea along with 50 pound weight gain. IV diuresed, nephrology consulted. Was then started on lasix  infusion. Permacath placed 12/28/23 and HD started. Will be getting outpatient dialysis on T, Th, Sat. Discharged to SNF.  Today he returns for HF follow up with a chief complaint of minimal shortness of breath. Has  associated fatigue, pedal edema. Appetite good. Sleeping well. Doing dialysis T, TH, Sat and gets midodrine  on dialysis days only. He says that his dialysis is being adjusted to get more fluid off. Wearing oxygen at 3L around the clock at home. Does not have a small portable tank for when he leaves the house. Says that he took his medications this morning but can't state with certainty anything he is taking. He says that he has to make an appointment to get his dialysis catheter exchanged.    ROS: All systems negative except what is listed in HPI, PMH and Problem List   Past Medical History:  Diagnosis Date   Alcohol abuse    Alcoholic cardiomyopathy (HCC)    a. 12/2007 MV: EF 28%, no isch;  b. 8/12 Echo: EF 25-35%; c. 02/2014 Echo: EF 20-25%; d. 12/2014 Cath: minimal CAD; e. 01/2015 Echo: EF 50-55%;  d. 05/2016 Echo: EF 30-35%, diff HK, gr2 DD; e. 11/2016 Echo: EF 45-50%, diff HK; f. 09/2021 Echo: EF <20%; g. 11/2021 Echo: EF<20%.   Chronic combined systolic (congestive) and diastolic (congestive) heart failure (HCC)    a. 05/2016 Echo: EF 30-35%; b. 11/2016 Echo: EF 45-50%, diff HK; c. 09/2021 Echo: EF < 20%; d. 11/2021 Echo: EF <20%, no rwma, Nl RV fxn, sev dil LA, mod-sev MR. mod dil PA w/ mildly elev PASP.   CKD (chronic kidney disease), stage III (HCC)    Elevated troponin (chronic)    Essential hypertension    GI bleed 11/2013   Hyperlipidemia    Pancreatitis    Paroxysmal A-fib (HCC)    a. new onset s/p unsuccessful TEE/DCCV on 08/16/2014; b. CHA2DS2VASc = 3-> eliquis  (freq noncompliant); c. 02/2021 s/p TEE/DCCV; d. Prev on amio->d/c 2/2 abnl TFTs.   Paroxysmal atrial flutter (HCC)    Sleep-disordered breathing    Has yet to have a sleep study   Stroke [facility])     Current Outpatient Medications  Medication Sig Dispense Refill   acetaminophen  (TYLENOL ) 325 MG tablet Take 2 tablets (650 mg total) by mouth every 6 (six) hours as needed for mild pain (pain score 1-3) or fever (or Fever >/=  101).     allopurinol  (ZYLOPRIM ) 100 MG tablet Take 100 mg by mouth daily.     amiodarone  (PACERONE ) 200 MG tablet Take 1 tablet (200 mg total) by mouth daily. 90 tablet 1   apixaban  (ELIQUIS ) 5 MG TABS tablet Take 1 tablet (5 mg total) by mouth 2 (two) times daily.     atorvastatin  (LIPITOR ) 80 MG tablet Take 1 tablet (80 mg total) by mouth daily. 90 tablet 1   heparin  sodium, porcine, 1000 UNIT/ML injection Inject 1,000 Units into the vein. Bolus, Every Treatment, Total treatment minutes 240     JARDIANCE  10 MG TABS tablet Take 10 mg by mouth daily.     levothyroxine  (SYNTHROID ) 125 MCG tablet Take 125 mcg by mouth daily before breakfast.     lidocaine  (LIDODERM ) 5 % Place 1 patch onto the skin every 12 (  twelve) hours. Remove & Discard patch within 12 hours or as directed by MD 10 patch 1   lipase/protease/amylase (CREON ) 36000 UNITS CPEP capsule Take 1 capsule (36,000 Units total) by mouth 3 (three) times daily before meals. 180 capsule 0   loratadine (CLARITIN) 10 MG tablet Take 10 mg by mouth at bedtime.     methocarbamol  (ROBAXIN ) 500 MG tablet Take 1 tablet (500 mg total) by mouth every 8 (eight) hours as needed. 30 tablet 0   midodrine  (PROAMATINE ) 10 MG tablet Take 1 tablet (10 mg total) by mouth Every Tuesday,Thursday,and Saturday with dialysis.     multivitamin (RENA-VIT) TABS tablet Take 1 tablet by mouth at bedtime.     Nutritional Supplements (,FEEDING SUPPLEMENT, PROSOURCE PLUS) liquid Take 30 mLs by mouth 4 (four) times daily - after meals and at bedtime.     ondansetron  (ZOFRAN ) 4 MG tablet Take 1 tablet (4 mg total) by mouth every 6 (six) hours as needed for nausea.     oxymetazoline (AFRIN) 0.05 % nasal spray Place 1 spray into both nostrils 2 (two) times daily as needed for congestion (nosebleed).     polyethylene glycol (MIRALAX  / GLYCOLAX ) 17 g packet Take 17 g by mouth daily as needed for mild constipation. 30 each 0   torsemide  (DEMADEX ) 10 MG tablet Take 5 tablets (50 mg  total) by mouth every Monday, Wednesday, and Friday.     traZODone  (DESYREL ) 50 MG tablet Take 0.5 tablets (25 mg total) by mouth at bedtime as needed for sleep.     No current facility-administered medications for this visit.   Allergies  Allergen Reactions   Esomeprazole Magnesium  Other (See Comments)    Suspected interstitial nephritis 2018   Egg Protein-Containing Drug Products Rash   Social History   Socioeconomic History   Marital status: Single    Spouse name: Not on file   Number of children: Not on file   Years of education: Not on file   Highest education level: Not on file  Occupational History   Not on file  Tobacco Use   Smoking status: Former    Current packs/day: 0.00    Average packs/day: 1 pack/day for 12.0 years (12.0 ttl pk-yrs)    Types: Cigarettes    Start date: 02/23/1986    Quit date: 02/23/1998    Years since quitting: 26.0   Smokeless tobacco: Never  Vaping Use   Vaping status: Never Used  Substance and Sexual Activity   Alcohol use: Not Currently    Alcohol/week: 0.0 standard drinks of alcohol    Comment: Past heavy drinker   Drug use: No   Sexual activity: Not Currently  Other Topics Concern   Not on file  Social History Narrative   Not on file   Social Drivers of Health   Tobacco Use: Medium Risk (01/12/2024)   Patient History    Smoking Tobacco Use: Former    Smokeless Tobacco Use: Never    Passive Exposure: Not on Actuary Strain: Low Risk (01/19/2023)   Overall Financial Resource Strain (CARDIA)    Difficulty of Paying Living Expenses: Not hard at all  Food Insecurity: No Food Insecurity (12/16/2023)   Epic    Worried About Radiation Protection Practitioner of Food in the Last Year: Never true    Ran Out of Food in the Last Year: Never true  Transportation Needs: No Transportation Needs (12/16/2023)   Epic    Lack of Transportation (Medical): No    Lack  of Transportation (Non-Medical): No  Physical Activity: Not on file  Stress: Not  on file  Social Connections: Unknown (12/16/2023)   Social Connection and Isolation Panel    Frequency of Communication with Friends and Family: More than three times a week    Frequency of Social Gatherings with Friends and Family: Once a week    Attends Religious Services: Never    Database Administrator or Organizations: No    Attends Banker Meetings: Patient declined    Marital Status: Patient declined  Intimate Partner Violence: Not At Risk (12/16/2023)   Epic    Fear of Current or Ex-Partner: No    Emotionally Abused: No    Physically Abused: No    Sexually Abused: No  Depression (PHQ2-9): Low Risk (08/19/2021)   Depression (PHQ2-9)    PHQ-2 Score: 0  Alcohol Screen: Low Risk (01/19/2023)   Alcohol Screen    Last Alcohol Screening Score (AUDIT): 0  Housing: Unknown (12/16/2023)   Epic    Unable to Pay for Housing in the Last Year: No    Number of Times Moved in the Last Year: Not on file    Homeless in the Last Year: No  Utilities: Not At Risk (12/16/2023)   Epic    Threatened with loss of utilities: No  Health Literacy: Not on file   Family History  Problem Relation Age of Onset   Hypertension Mother    Hyperlipidemia Mother    Diabetes Mother    Vitals:   03/15/24 0959  BP: (!) 119/99  Pulse: 90  SpO2: 98%  Weight: 280 lb 9.6 oz (127.3 kg)   Wt Readings from Last 3 Encounters:  03/15/24 280 lb 9.6 oz (127.3 kg)  01/07/24 281 lb 1.4 oz (127.5 kg)  12/19/23 (!) 300 lb 4.8 oz (136.2 kg)   Lab Results  Component Value Date   CREATININE 4.52 (H) 01/07/2024   CREATININE 5.05 (H) 01/06/2024   CREATININE 4.38 (H) 01/05/2024     PHYSICAL EXAM:  General: Well appearing.  Cor: No JVD. Irregular rhythm, rate.  Lungs: clear Abdomen: soft, nontender, nondistended. Extremities:1+ pitting edema bilateral lower legs Neuro:. Affect pleasant    ASSESSMENT & PLAN:  1. 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  - continue jardiance  10mg  daily - He says that he's taking 20mg  torsemide  daily on non-dialysis days - compression socks daily, elevate legs. Fluid status now controlled by dialysis  2. Paroxysmal atrial fibrillation/atrial flutter- - continue apixaban  5 mg twice daily for CHA2DS2-VASc score of at least 6. Denies abnormal bleeding - continue amio 200mg  daily  - TEE/DC-CV 3/24  - DC-CV 11/24 - DC-CV 6/25 - TSH 07/28/23 was 39.399. Synthroid  dose managed by PCP at facility - needs yearly eye exams  CKD stage V- - continues with dialysis T, Th, Sat - gets midodrine  10mg  on dialysis days - saw nephrology Alphonse) 10/25 - BMET 01/07/24 reviewed: sodium 132, potassium 4.2, creatinine 4.52, GFR 14   4. Nonobstructive coronary artery disease- - Cath 2016 -  continue atorvastatin  80mg  daily - LDL 11/04/23 was 131 - referral back to [facility] cardiology placed today   5. Obstructive sleep apnea- - last sleep study done 12/2015 - wearing CPAP nightly / oxygen at 3L around the clock when he's at home   6. Type 2 diabetes- - A1c 7.4% on 12/18/23   7. Syncopal episode x 2 (06/25) - suspect was related to AFL but also at high-risk for VT. VQ + for PE - Continue amiodarone  200 mg daily - has previously not been an ICD candidate due to poor compliance - He previously cancelled last 2 EP appointments.  - No further syncopal episodea   Since fluid status continues to be managed by dialysis and unable to use further GDMT, will not make a return appointment back here. Emphasized that he f/u with [facility] cardiology  and he verbalized understanding.   I spent 30 minutes reviewing records, interviewing/ examing patient and managing plan/ orders.    Hector [name] Class  FNP-C  03/15/24  "

## 2024-03-15 NOTE — Patient Instructions (Signed)
 Medication Changes:  No medication changes today!   Referrals:  You have been to [facility]. They will contact you in order to schedule an appointment.    Follow-Up in: No need to follow up with us ! You will follow up with [facility]. It was a pleasure seeing you today!   Thank you for choosing [facility].    At the [facility], you and your health needs are our priority. We have a designated team specialized in the treatment of Heart Failure. This Care Team includes your primary Heart Failure Specialized Cardiologist (physician), Advanced Practice Providers (APPs- Physician Assistants and Nurse Practitioners), and Pharmacist who all work together to provide you with the care you need, when you need it.   You may see any of the following providers on your designated Care Team at your next follow up:  Dr. Toribio Fuel Dr. Ezra Shuck Dr. Ria Commander Dr. Morene Brownie Ellouise Class, FNP Jaun Bash, RPH-CPP  Please be sure to bring in all your medications bottles to every appointment.   Need to Contact Us :  If you have any questions or concerns before your next appointment please send us  a message through [place] or call our office at [phone].    TO LEAVE A MESSAGE FOR THE NURSE SELECT OPTION 2, PLEASE LEAVE A MESSAGE INCLUDING: YOUR NAME DATE OF BIRTH CALL BACK NUMBER REASON FOR CALL**this is important as we prioritize the call backs  YOU WILL RECEIVE A CALL BACK THE SAME DAY AS LONG AS YOU CALL BEFORE 4:00 PM

## 2024-03-29 ENCOUNTER — Other Ambulatory Visit: Payer: Self-pay

## 2024-03-29 ENCOUNTER — Observation Stay
Admission: EM | Admit: 2024-03-29 | Discharge: 2024-03-30 | Disposition: A | Source: Home / Self Care | Attending: Emergency Medicine | Admitting: Emergency Medicine

## 2024-03-29 DIAGNOSIS — I10 Essential (primary) hypertension: Secondary | ICD-10-CM | POA: Diagnosis present

## 2024-03-29 DIAGNOSIS — T8249XA Other complication of vascular dialysis catheter, initial encounter: Principal | ICD-10-CM

## 2024-03-29 DIAGNOSIS — E785 Hyperlipidemia, unspecified: Secondary | ICD-10-CM | POA: Diagnosis present

## 2024-03-29 DIAGNOSIS — N186 End stage renal disease: Secondary | ICD-10-CM | POA: Diagnosis not present

## 2024-03-29 DIAGNOSIS — J449 Chronic obstructive pulmonary disease, unspecified: Secondary | ICD-10-CM | POA: Diagnosis not present

## 2024-03-29 DIAGNOSIS — I5022 Chronic systolic (congestive) heart failure: Secondary | ICD-10-CM | POA: Diagnosis not present

## 2024-03-29 DIAGNOSIS — I639 Cerebral infarction, unspecified: Secondary | ICD-10-CM | POA: Diagnosis present

## 2024-03-29 DIAGNOSIS — E1129 Type 2 diabetes mellitus with other diabetic kidney complication: Secondary | ICD-10-CM | POA: Diagnosis present

## 2024-03-29 DIAGNOSIS — Z4901 Encounter for fitting and adjustment of extracorporeal dialysis catheter: Secondary | ICD-10-CM | POA: Diagnosis not present

## 2024-03-29 DIAGNOSIS — I480 Paroxysmal atrial fibrillation: Secondary | ICD-10-CM | POA: Diagnosis present

## 2024-03-29 DIAGNOSIS — Z992 Dependence on renal dialysis: Secondary | ICD-10-CM

## 2024-03-29 DIAGNOSIS — E669 Obesity, unspecified: Secondary | ICD-10-CM | POA: Diagnosis not present

## 2024-03-29 LAB — CBC WITH DIFFERENTIAL/PLATELET
Abs Immature Granulocytes: 0.04 10*3/uL (ref 0.00–0.07)
Basophils Absolute: 0.1 10*3/uL (ref 0.0–0.1)
Basophils Relative: 1 %
Eosinophils Absolute: 0.2 10*3/uL (ref 0.0–0.5)
Eosinophils Relative: 3 %
HCT: 36.5 % — ABNORMAL LOW (ref 39.0–52.0)
Hemoglobin: 11.3 g/dL — ABNORMAL LOW (ref 13.0–17.0)
Immature Granulocytes: 1 %
Lymphocytes Relative: 23 %
Lymphs Abs: 1.4 10*3/uL (ref 0.7–4.0)
MCH: 28.7 pg (ref 26.0–34.0)
MCHC: 31 g/dL (ref 30.0–36.0)
MCV: 92.6 fL (ref 80.0–100.0)
Monocytes Absolute: 0.5 10*3/uL (ref 0.1–1.0)
Monocytes Relative: 8 %
Neutro Abs: 3.7 10*3/uL (ref 1.7–7.7)
Neutrophils Relative %: 64 %
Platelets: 171 10*3/uL (ref 150–400)
RBC: 3.94 MIL/uL — ABNORMAL LOW (ref 4.22–5.81)
RDW: 18.6 % — ABNORMAL HIGH (ref 11.5–15.5)
WBC: 5.9 10*3/uL (ref 4.0–10.5)
nRBC: 0 % (ref 0.0–0.2)

## 2024-03-29 LAB — COMPREHENSIVE METABOLIC PANEL WITH GFR
ALT: 9 U/L (ref 0–44)
AST: 25 U/L (ref 15–41)
Albumin: 4.1 g/dL (ref 3.5–5.0)
Alkaline Phosphatase: 103 U/L (ref 38–126)
Anion gap: 13 (ref 5–15)
BUN: 35 mg/dL — ABNORMAL HIGH (ref 6–20)
CO2: 28 mmol/L (ref 22–32)
Calcium: 9 mg/dL (ref 8.9–10.3)
Chloride: 98 mmol/L (ref 98–111)
Creatinine, Ser: 4.45 mg/dL — ABNORMAL HIGH (ref 0.61–1.24)
GFR, Estimated: 14 mL/min — ABNORMAL LOW
Glucose, Bld: 146 mg/dL — ABNORMAL HIGH (ref 70–99)
Potassium: 4.6 mmol/L (ref 3.5–5.1)
Sodium: 139 mmol/L (ref 135–145)
Total Bilirubin: 0.9 mg/dL (ref 0.0–1.2)
Total Protein: 7.3 g/dL (ref 6.5–8.1)

## 2024-03-29 LAB — CBG MONITORING, ED: Glucose-Capillary: 114 mg/dL — ABNORMAL HIGH (ref 70–99)

## 2024-03-29 MED ORDER — ALLOPURINOL 100 MG PO TABS
100.0000 mg | ORAL_TABLET | Freq: Every day | ORAL | Status: DC
  Filled 2024-03-29: qty 1

## 2024-03-29 MED ORDER — AMIODARONE HCL 200 MG PO TABS: 200.0000 mg | ORAL_TABLET | Freq: Every day | ORAL | Status: DC

## 2024-03-29 MED ORDER — ACETAMINOPHEN 325 MG PO TABS: 650.0000 mg | ORAL_TABLET | Freq: Four times a day (QID) | ORAL | Status: DC | PRN

## 2024-03-29 MED ORDER — DM-GUAIFENESIN ER 30-600 MG PO TB12: 1.0000 | ORAL_TABLET | Freq: Two times a day (BID) | ORAL | Status: DC | PRN

## 2024-03-29 MED ORDER — LEVOTHYROXINE SODIUM 50 MCG PO TABS
125.0000 ug | ORAL_TABLET | Freq: Every day | ORAL | Status: DC
  Administered 2024-03-30: 125 ug via ORAL
  Filled 2024-03-29: qty 3

## 2024-03-29 MED ORDER — ONDANSETRON HCL 4 MG/2ML IJ SOLN: 4.0000 mg | Freq: Three times a day (TID) | INTRAMUSCULAR | Status: DC | PRN

## 2024-03-29 MED ORDER — ATORVASTATIN CALCIUM 20 MG PO TABS: 80.0000 mg | ORAL_TABLET | Freq: Every day | ORAL | Status: DC

## 2024-03-29 MED ORDER — HYDRALAZINE HCL 20 MG/ML IJ SOLN: 5.0000 mg | INTRAMUSCULAR | Status: DC | PRN

## 2024-03-29 MED ORDER — CALCITRIOL 0.25 MCG PO CAPS
0.2500 ug | ORAL_CAPSULE | Freq: Every day | ORAL | Status: DC
  Filled 2024-03-29: qty 1

## 2024-03-29 MED ORDER — TORSEMIDE 20 MG PO TABS: 20.0000 mg | ORAL_TABLET | Freq: Every day | ORAL | Status: DC

## 2024-03-29 MED ORDER — INSULIN ASPART 100 UNIT/ML IJ SOLN: 0.0000 [IU] | Freq: Three times a day (TID) | INTRAMUSCULAR | Status: DC

## 2024-03-29 MED ORDER — ALBUTEROL SULFATE (2.5 MG/3ML) 0.083% IN NEBU: 2.5000 mg | INHALATION_SOLUTION | RESPIRATORY_TRACT | Status: DC | PRN

## 2024-03-29 MED ORDER — TRAZODONE HCL 50 MG PO TABS: 25.0000 mg | ORAL_TABLET | Freq: Every evening | ORAL | Status: DC | PRN

## 2024-03-29 MED ORDER — PANCRELIPASE (LIP-PROT-AMYL) 36000-114000 UNITS PO CPEP
36000.0000 [IU] | ORAL_CAPSULE | Freq: Three times a day (TID) | ORAL | Status: DC
  Administered 2024-03-30: 36000 [IU] via ORAL
  Filled 2024-03-29 (×2): qty 1

## 2024-03-29 MED ORDER — INSULIN ASPART 100 UNIT/ML IJ SOLN: 0.0000 [IU] | Freq: Every day | INTRAMUSCULAR | Status: DC

## 2024-03-29 NOTE — H&P (Signed)
 " History and Physical    Hector [name] FMW:[id number] DOB: 1965-09-26 DOA: 03/29/2024  Referring MD/NP/PA:   PCP: [name], MD   Patient coming from:  The patient is coming from home.     Chief Complaint: Dialysis catheter problem  HPI: Hector [name] is a 59 y.o. male with medical history significant of ESRD-HD (TTS), A. fib on Eliquis , pancreatitis, sCHF with EF < 20%, HTN, HLD, stroke, hypothyroidism, alcohol abuse in remission, GI bleeding, OSA, anemia,  who presents with dialysis catheter problem.  Pt has Permacath catheter over right upper chest. Pt had partial dialysis on Tuesday (1/3) since the dialysis catheter is no longer working properly. There was an air leak in the catheter. He states that he was able to complete about 30 min of HD on Tuesday. Patient was sent over by the nephrologist at [facility] for replacement of his catheter.  Patient does not have SOB, chest pain, cough.  No nausea, vomiting, diarrhea or abdominal pain.  No symptoms of UTI.  No fever or chills.  He took last dose of Eliquis  in the morning.  Of note, pt had dialysis at [facility] on 1/30. Did not have dialysis over the weekend due to snowstorm. Attempted regular scheduled dialysis yesterday, b   Data reviewed independently and ED Course: pt was found to have WBC 5.9, potassium 4.6, bicarbonate 28, creatinine 4.45, BUN 35.  Temperature normal, blood pressure 115/85, heart rate 91, RR 16, oxygen saturation 100% room air.  Patient is placed in telemetry bed for observation.  Consulted Dr. Marea of VS, Dr. Douglas of nephrology.  EKG: Not done in ED, will get one.   Review of Systems:   General: no fevers, chills, no body weight gain, fatigue HEENT: no blurry vision, hearing changes or sore throat Respiratory: no dyspnea, coughing, wheezing CV: no chest pain, no palpitations GI: no nausea, vomiting, abdominal pain, diarrhea, constipation GU: no dysuria, burning on urination, increased urinary  frequency, hematuria  Ext: has leg edema Neuro: no unilateral weakness, numbness, or tingling, no vision change or hearing loss Skin: no rash, no skin tear. MSK: No muscle spasm, no deformity, no limitation of range of movement in spin Heme: No easy bruising.  Travel history: No recent long distant travel.   Allergy: Allergies[1]  Past Medical History:  Diagnosis Date   Alcohol abuse    Alcoholic cardiomyopathy (HCC)    a. 12/2007 MV: EF 28%, no isch;  b. 8/12 Echo: EF 25-35%; c. 02/2014 Echo: EF 20-25%; d. 12/2014 Cath: minimal CAD; e. 01/2015 Echo: EF 50-55%;  d. 05/2016 Echo: EF 30-35%, diff HK, gr2 DD; e. 11/2016 Echo: EF 45-50%, diff HK; f. 09/2021 Echo: EF <20%; g. 11/2021 Echo: EF<20%.   Chronic combined systolic (congestive) and diastolic (congestive) heart failure (HCC)    a. 05/2016 Echo: EF 30-35%; b. 11/2016 Echo: EF 45-50%, diff HK; c. 09/2021 Echo: EF < 20%; d. 11/2021 Echo: EF <20%, no rwma, Nl RV fxn, sev dil LA, mod-sev MR. mod dil PA w/ mildly elev PASP.   CKD (chronic kidney disease), stage III (HCC)    Elevated troponin (chronic)    Essential hypertension    GI bleed 11/2013   Hyperlipidemia    Pancreatitis    Paroxysmal A-fib (HCC)    a. new onset s/p unsuccessful TEE/DCCV on 08/16/2014; b. CHA2DS2VASc = 3-> eliquis  (freq noncompliant); c. 02/2021 s/p TEE/DCCV; d. Prev on amio->d/c 2/2 abnl TFTs.   Paroxysmal atrial flutter (HCC)    Sleep-disordered  breathing    Has yet to have a sleep study   Stroke [facility])     Past Surgical History:  Procedure Laterality Date   CARDIAC CATHETERIZATION N/A 01/09/2015   Procedure: Left Heart Cath and Coronary Angiography;  Surgeon: Alm LELON Clay, MD;  Location: [facility] INVASIVE CV LAB;  Service: Cardiovascular;  Laterality: N/A;   CARDIOVERSION N/A 03/05/2021   Procedure: CARDIOVERSION;  Surgeon: Darliss Rogue, MD;  Location: [facility] ORS;  Service: Cardiovascular;  Laterality: N/A;   CARDIOVERSION N/A 01/19/2023   Procedure:  CARDIOVERSION;  Surgeon: Rolan Ezra RAMAN, MD;  Location: [facility] ORS;  Service: Cardiovascular;  Laterality: N/A;   CARDIOVERSION N/A 07/30/2023   Procedure: CARDIOVERSION;  Surgeon: Cherrie Toribio SAUNDERS, MD;  Location: [facility] ORS;  Service: Cardiovascular;  Laterality: N/A;   CENTRAL LINE INSERTION  04/28/2022   Procedure: CENTRAL LINE INSERTION;  Surgeon: Mady Bruckner, MD;  Location: [facility] INVASIVE CV LAB;  Service: Cardiovascular;;   COLONOSCOPY N/A 07/22/2020   Procedure: COLONOSCOPY;  Surgeon: Toledo, Ladell POUR, MD;  Location: [facility] ENDOSCOPY;  Service: Gastroenterology;  Laterality: N/A;   DIALYSIS/PERMA CATHETER INSERTION N/A 12/28/2023   Procedure: DIALYSIS/PERMA CATHETER INSERTION;  Surgeon: Jama Cordella MATSU, MD;  Location: [facility] INVASIVE CV LAB;  Service: Cardiovascular;  Laterality: N/A;   ELECTROPHYSIOLOGIC STUDY N/A 08/16/2014   Procedure: CARDIOVERSION;  Surgeon: Evalene JINNY Lunger, MD;  Location: [facility] ORS;  Service: Cardiovascular;  Laterality: N/A;   ESOPHAGOGASTRODUODENOSCOPY (EGD) WITH PROPOFOL  N/A 05/04/2022   Procedure: ESOPHAGOGASTRODUODENOSCOPY (EGD) WITH PROPOFOL ;  Surgeon: Unk Corinn Skiff, MD;  Location: [facility] ENDOSCOPY;  Service: Gastroenterology;  Laterality: N/A;   FLEXIBLE SIGMOIDOSCOPY N/A 10/10/2015   Procedure: FLEXIBLE SIGMOIDOSCOPY;  Surgeon: Gladis RAYMOND Mariner, MD;  Location: [facility] ENDOSCOPY;  Service: Endoscopy;  Laterality: N/A;   KNEE SURGERY Right    NM MYOVIEW  LTD  November 2011   No ischemia or infarction. EF 50-55% ( no improvement from 2009 Myoview  EF of 28%   RIGHT HEART CATH N/A 04/28/2022   Procedure: RIGHT HEART CATH;  Surgeon: Mady Bruckner, MD;  Location: [facility] INVASIVE CV LAB;  Service: Cardiovascular;  Laterality: N/A;   RIGHT HEART CATH N/A 06/23/2023   Procedure: RIGHT HEART CATH;  Surgeon: Rolan Ezra RAMAN, MD;  Location: [facility] INVASIVE CV LAB;  Service: Cardiovascular;  Laterality: N/A;   TEE WITHOUT CARDIOVERSION N/A 08/16/2014   Procedure:  TRANSESOPHAGEAL ECHOCARDIOGRAM (TEE);  Surgeon: Evalene JINNY Lunger, MD;  Location: [facility] ORS;  Service: Cardiovascular;  Laterality: N/A;   TEE WITHOUT CARDIOVERSION N/A 03/05/2021   Procedure: TRANSESOPHAGEAL ECHOCARDIOGRAM (TEE);  Surgeon: Darliss Rogue, MD;  Location: [facility] ORS;  Service: Cardiovascular;  Laterality: N/A;   TEE WITHOUT CARDIOVERSION N/A 05/01/2022   Procedure: TRANSESOPHAGEAL ECHOCARDIOGRAM;  Surgeon: Rolan Ezra RAMAN, MD;  Location: [facility] ORS;  Service: Cardiovascular;  Laterality: N/A;  TEE with cardioversion   TEE WITHOUT CARDIOVERSION N/A 01/19/2023   Procedure: TRANSESOPHAGEAL ECHOCARDIOGRAM (TEE);  Surgeon: Rolan Ezra RAMAN, MD;  Location: [facility] ORS;  Service: Cardiovascular;  Laterality: N/A;   TEE WITHOUT CARDIOVERSION N/A 07/30/2023   Procedure: ECHOCARDIOGRAM, TRANSESOPHAGEAL;  Surgeon: Cherrie Toribio SAUNDERS, MD;  Location: [facility] ORS;  Service: Cardiovascular;  Laterality: N/A;    Social History:  reports that he quit smoking about 26 years ago. His smoking use included cigarettes. He started smoking about 38 years ago. He has a 12 pack-year smoking history. He has never used smokeless tobacco. He reports that he does not currently use alcohol. He reports that he does not use drugs.  Family History:  Family History  Problem Relation Age of Onset   Hypertension Mother    Hyperlipidemia Mother    Diabetes Mother      Prior to Admission medications  Medication Sig Start Date End Date Taking? Authorizing Provider  acetaminophen  (TYLENOL ) 325 MG tablet Take 2 tablets (650 mg total) by mouth every 6 (six) hours as needed for mild pain (pain score 1-3) or fever (or Fever >/= 101). 01/06/24   Alexander, Natalie, DO  allopurinol  (ZYLOPRIM ) 100 MG tablet Take 100 mg by mouth daily. 05/03/23   [provider]  amiodarone  (PACERONE ) 200 MG tablet Take 1 tablet (200 mg total) by mouth daily. 09/02/23   Donette Ellouise LABOR, FNP  apixaban  (ELIQUIS ) 5 MG TABS tablet Take 1 tablet (5  mg total) by mouth 2 (two) times daily. 01/06/24   Alexander, Natalie, DO  atorvastatin  (LIPITOR ) 80 MG tablet Take 1 tablet (80 mg total) by mouth daily. 09/02/23   Donette Ellouise LABOR, FNP  heparin  sodium, porcine, 1000 UNIT/ML injection Inject 1,000 Units into the vein. Bolus, Every Treatment, Total treatment minutes 240 01/18/24 01/30/25  [provider]  JARDIANCE  10 MG TABS tablet Take 10 mg by mouth daily. 02/04/24   [provider]  levothyroxine  (SYNTHROID ) 125 MCG tablet Take 125 mcg by mouth daily before breakfast.    [provider]  lidocaine  (LIDODERM ) 5 % Place 1 patch onto the skin every 12 (twelve) hours. Remove & Discard patch within 12 hours or as directed by MD 12/10/23 12/09/24  Claudene Rover, MD  lipase/protease/amylase (CREON ) 36000 UNITS CPEP capsule Take 1 capsule (36,000 Units total) by mouth 3 (three) times daily before meals. 01/19/23   Josette Ade, MD  loratadine (CLARITIN) 10 MG tablet Take 10 mg by mouth at bedtime.    [provider]  methocarbamol  (ROBAXIN ) 500 MG tablet Take 1 tablet (500 mg total) by mouth every 8 (eight) hours as needed. 12/10/23   Claudene Rover, MD  midodrine  (PROAMATINE ) 10 MG tablet Take 1 tablet (10 mg total) by mouth Every Tuesday,Thursday,and Saturday with dialysis. 01/08/24   Alexander, Natalie, DO  multivitamin (RENA-VIT) TABS tablet Take 1 tablet by mouth at bedtime. 01/06/24   Alexander, Natalie, DO  Nutritional Supplements (,FEEDING SUPPLEMENT, PROSOURCE PLUS) liquid Take 30 mLs by mouth 4 (four) times daily - after meals and at bedtime. 02/02/24   [provider]  ondansetron  (ZOFRAN ) 4 MG tablet Take 1 tablet (4 mg total) by mouth every 6 (six) hours as needed for nausea. 01/06/24   Alexander, Natalie, DO  oxymetazoline (AFRIN) 0.05 % nasal spray Place 1 spray into both nostrils 2 (two) times daily as needed for congestion (nosebleed).    [provider]  polyethylene glycol (MIRALAX  /  GLYCOLAX ) 17 g packet Take 17 g by mouth daily as needed for mild constipation. 01/19/23   Josette Ade, MD  torsemide  (DEMADEX ) 10 MG tablet Take 5 tablets (50 mg total) by mouth every Monday, Wednesday, and Friday. Patient taking differently: Take 20 mg by mouth daily. Except hd days 01/10/24   Alexander, Natalie, DO  traZODone  (DESYREL ) 50 MG tablet Take 0.5 tablets (25 mg total) by mouth at bedtime as needed for sleep. 01/06/24   Marsa Edelman, DO    Physical Exam: Vitals:   03/29/24 1435 03/29/24 1835 03/29/24 2348 03/30/24 0000  BP:      Pulse:    85  Resp:    13  Temp:  98 F (36.7 C) 97.7 F (36.5 C)  TempSrc:   Oral   SpO2: 100%   100%  Weight:       General: Not in acute distress HEENT:       Eyes: PERRL, EOMI, no jaundice       ENT: No discharge from the ears and nose, no pharynx injection, no tonsillar enlargement.        Neck: No JVD, no bruit, no mass felt. Heme: No neck lymph node enlargement. Cardiac: S1/S2, RRR, No murmurs, No gallops or rubs. Respiratory: No rales, wheezing, rhonchi or rubs. GI: Soft, nondistended, nontender, no rebound pain, no organomegaly, BS present. GU: No hematuria Ext: 1+ pitting leg edema bilaterally. 1+DP/PT pulse bilaterally. Musculoskeletal: No joint deformities, No joint redness or warmth, no limitation of ROM in spin. Skin: No rashes.  Neuro: Alert, oriented X3, cranial nerves II-XII grossly intact, moves all extremities normally. Psych: Patient is not psychotic, no suicidal or hemocidal ideation.  Labs on Admission: I have personally reviewed following labs and imaging studies  CBC: Recent Labs  Lab 03/29/24 1440  WBC 5.9  NEUTROABS 3.7  HGB 11.3*  HCT 36.5*  MCV 92.6  PLT 171   Basic Metabolic Panel: Recent Labs  Lab 03/29/24 1440  NA 139  K 4.6  CL 98  CO2 28  GLUCOSE 146*  BUN 35*  CREATININE 4.45*  CALCIUM  9.0   GFR: Estimated Creatinine Clearance: 23.3 mL/min (A) (by C-G formula based on SCr  of 4.45 mg/dL (H)). Liver Function Tests: Recent Labs  Lab 03/29/24 1440  AST 25  ALT 9  ALKPHOS 103  BILITOT 0.9  PROT 7.3  ALBUMIN  4.1   No results for input(s): LIPASE, AMYLASE in the last 168 hours. No results for input(s): AMMONIA in the last 168 hours. Coagulation Profile: No results for input(s): INR, PROTIME in the last 168 hours. Cardiac Enzymes: No results for input(s): CKTOTAL, CKMB, CKMBINDEX, TROPONINI in the last 168 hours. BNP (last 3 results) No results for input(s): PROBNP in the last 8760 hours. HbA1C: No results for input(s): HGBA1C in the last 72 hours. CBG: Recent Labs  Lab 03/29/24 2301  GLUCAP 114*   Lipid Profile: No results for input(s): CHOL, HDL, LDLCALC, TRIG, CHOLHDL, LDLDIRECT in the last 72 hours. Thyroid  Function Tests: No results for input(s): TSH, T4TOTAL, FREET4, T3FREE, THYROIDAB in the last 72 hours. Anemia Panel: No results for input(s): VITAMINB12, FOLATE, FERRITIN, TIBC, IRON , RETICCTPCT in the last 72 hours. Urine analysis:    Component Value Date/Time   COLORURINE YELLOW (A) 12/23/2023 1940   APPEARANCEUR CLEAR (A) 12/23/2023 1940   APPEARANCEUR Clear 02/26/2014 1126   LABSPEC 1.007 12/23/2023 1940   LABSPEC 1.008 02/26/2014 1126   PHURINE 7.0 12/23/2023 1940   GLUCOSEU >=500 (A) 12/23/2023 1940   GLUCOSEU Negative 02/26/2014 1126   HGBUR MODERATE (A) 12/23/2023 1940   BILIRUBINUR NEGATIVE 12/23/2023 1940   BILIRUBINUR Negative 02/26/2014 1126   KETONESUR NEGATIVE 12/23/2023 1940   PROTEINUR 100 (A) 12/23/2023 1940   NITRITE NEGATIVE 12/23/2023 1940   LEUKOCYTESUR SMALL (A) 12/23/2023 1940   LEUKOCYTESUR 1+ 02/26/2014 1126   Sepsis Labs: @LABRCNTIP (procalcitonin:4,lacticidven:4) )No results found for this or any previous visit (from the past 240 hours).   Radiological Exams on Admission:   Assessment/Plan Principal Problem:   Encounter for dialysis catheter  care Active Problems:   ESRD on hemodialysis (HCC)   Paroxysmal atrial fibrillation (HCC)   Chronic systolic CHF (congestive heart failure) (HCC)   Type II diabetes mellitus with renal manifestations (HCC)  COPD (chronic obstructive pulmonary disease) (HCC)   Essential hypertension   Stroke (HCC)   HLD (hyperlipidemia)   Obesity (BMI 30-39.9)   Assessment and Plan:   Encounter for dialysis catheter care: -will place in tele bed for obs -consulted Dr. Marea of [facility] by EDP -hold Eliquis    ESRD on hemodialysis (TTS): -consulted Dr. Douglas of renal  Paroxysmal atrial fibrillation [facility]): Heart rate in 90s. - Hold Eliquis  temporarily - Continue amiodarone   Chronic systolic CHF (congestive heart failure) (HCC): 2D echo on 07/30/2023 showed EF<20%.  Patient has 1+ leg edema, no SOB.  Does not seem to have CHF exacerbation. -Fluid restriction - Volume management per renal by dialysis  Type II diabetes mellitus with renal manifestations [facility]): Patient is taking Jardiance .  Recent A1c 7.4, poorly controlled. -SSI  COPD (chronic obstructive pulmonary disease) (HCC): Stable. - Bronchodilators and as needed Mucinex   Essential hypertension -IV hydralazine  as needed - Patient is on torsemide   Stroke (HCC) -Lipitor   HLD (hyperlipidemia) -Lipitor   Obesity (BMI 30-39.9):  Patient has Obesity Class II, with body weight 117.9 Kg and BMI 36.26 kg/m2.  - Encourage losing weight - Exercise and healthy diet       DVT ppx: SCD  Code Status: Full code    Family Communication:     not done, no family member is at bed side.       Disposition Plan:  Anticipate discharge back to previous environment  Consults called:  Consulted Dr. Marea of VS, Dr. Douglas of nephrology.   Admission status and Level of care: Telemetry:    for obs as inpt        Dispo: The patient is from: Home              Anticipated d/c is to: Home              Anticipated d/c date is: 1 day               Patient currently is not medically stable to d/c.    Severity of Illness:  The appropriate patient status for this patient is OBSERVATION. Observation status is judged to be reasonable and necessary in order to provide the required intensity of service to ensure the patient's safety. The patient's presenting symptoms, physical exam findings, and initial radiographic and laboratory data in the context of their medical condition is felt to place them at decreased risk for further clinical deterioration. Furthermore, it is anticipated that the patient will be medically stable for discharge from the hospital within 2 midnights of admission.        Date of Service 03/30/2024    Caleb Exon [facility]   If 7PM-7AM, please contact night-coverage [email or web address] 03/30/2024, 1:21 AM     [1]  Allergies Allergen Reactions   Esomeprazole Magnesium  Other (See Comments)    Suspected interstitial nephritis 2018   Egg Protein-Containing Drug Products Rash   "

## 2024-03-29 NOTE — ED Notes (Signed)
 Pt given food and drink and wants to stay in recliner.

## 2024-03-29 NOTE — ED Triage Notes (Signed)
 Right chest dialysis catheter has a pinhole in catheter and needs a catheter change.  Had one hour of dialysis yesterday and had to stop treatment due to catheter malfunction.  AAOx3. Skin warm and dry. NAD

## 2024-03-29 NOTE — ED Provider Notes (Signed)
 "  [facility] Provider Note    Event Date/Time   First MD Initiated Contact with Patient 03/29/24 1701     (approximate)   History   medical device problem   HPI  Hector [name] is a 59 y.o. male with PMH of A-fib, cardiomyopathy, CHF, diabetes, ESRD on dialysis Tuesday Thursday Saturday presents for evaluation of a problem with his chest dialysis catheter.  Patient last had dialysis on 1/30 and did not have dialysis over the weekend due to the snowstorm.  When he presented to the dialysis center yesterday he had less than 30 minutes of dialysis done because there was an air leak in the catheter.  Patient was sent over by the nephrologist at [facility] for replacement of his catheter.  Patient cannot be scheduled to have the catheter exchanged until 2/12 at [facility] and due to his heart failure he is not safe to wait until 2/12.  Patient denies symptoms at this time.      Physical Exam   Triage Vital Signs: ED Triage Vitals  Encounter Vitals Group     BP 03/29/24 1434 115/85     Girls Systolic BP Percentile --      Girls Diastolic BP Percentile --      Boys Systolic BP Percentile --      Boys Diastolic BP Percentile --      Pulse Rate 03/29/24 1434 91     Resp 03/29/24 1434 16     Temp 03/29/24 1434 97.6 F (36.4 C)     Temp Source 03/29/24 1434 Oral     SpO2 03/29/24 1435 100 %     Weight 03/29/24 1433 260 lb (117.9 kg)     Height --      Head Circumference --      Peak Flow --      Pain Score 03/29/24 1433 0     Pain Loc --      Pain Education --      Exclude from Growth Chart --     Most recent vital signs: Vitals:   03/29/24 1435 03/29/24 1835  BP:    Pulse:    Resp:    Temp:  98 F (36.7 C)  SpO2: 100%    General: Awake, no distress.  CV:  Good peripheral perfusion.  Irregular rate and rhythm. Resp:  Normal effort.  CTAB. Abd:  No distention.  Other:     ED Results / Procedures / Treatments   Labs (all labs ordered are  listed, but only abnormal results are displayed) Labs Reviewed  CBC WITH DIFFERENTIAL/PLATELET - Abnormal; Notable for the following components:      Result Value   RBC 3.94 (*)    Hemoglobin 11.3 (*)    HCT 36.5 (*)    RDW 18.6 (*)    All other components within normal limits  COMPREHENSIVE METABOLIC PANEL WITH GFR - Abnormal; Notable for the following components:   Glucose, Bld 146 (*)    BUN 35 (*)    Creatinine, Ser 4.45 (*)    GFR, Estimated 14 (*)    All other components within normal limits     EKG    RADIOLOGY   PROCEDURES:  Critical Care performed: No  Procedures   MEDICATIONS ORDERED IN ED: Medications  ondansetron  (ZOFRAN ) injection 4 mg (has no administration in time range)  hydrALAZINE  (APRESOLINE ) injection 5 mg (has no administration in time range)  acetaminophen  (TYLENOL ) tablet 650 mg (has no administration  in time range)  albuterol  (VENTOLIN  HFA) 108 (90 Base) MCG/ACT inhaler 2 puff (has no administration in time range)  dextromethorphan -guaiFENesin  (MUCINEX  DM) 30-600 MG per 12 hr tablet 1 tablet (has no administration in time range)     IMPRESSION / MDM / ASSESSMENT AND PLAN / ED COURSE  I reviewed the triage vital signs and the nursing notes.                             59 year old male presents for evaluation of dialysis catheter replacement.  Vital signs are stable patient NAD on exam.  Differential diagnosis includes, but is not limited to, dialysis catheter exchange.  Patient's presentation is most consistent with acute complicated illness / injury requiring diagnostic workup.  CMP shows elevated creatinine and BUN consistent with patient's baseline.  CBC shows mild anemia consistent with patient's baseline, likely due to chronic disease.   Patient will be admitted for dialysis catheter replacement tomorrow.  Clinical Course as of 03/29/24 1934  Wed Mar 29, 2024  1733 Reached out to interventional radiology to see if they would  replace the dialysis catheter, Dr. Karalee stated that this is a procedure that they do but that it is only done during business hours. [LD]  1739 Spoke with Dr. Marea of vascular surgery who advised me to admit patient to the hospital so that he could do the catheter replacement tomorrow. Patient will need to be NPO at midnight. [LD]  1740 Consult to hospitalist placed. [LD]  1931 Hospitalist agreeable to admit. [LD]    Clinical Course User Index [LD] Cleaster Tinnie LABOR, PA-C     FINAL CLINICAL IMPRESSION(S) / ED DIAGNOSES   Final diagnoses:  Mechanical complication of dialysis catheter, initial encounter     Rx / DC Orders   ED Discharge Orders     None        Note:  This document was prepared using Dragon voice recognition software and may include unintentional dictation errors.   Cleaster Tinnie LABOR, PA-C 03/29/24 1934  "

## 2024-03-29 NOTE — ED Triage Notes (Addendum)
 FIRST RN: Received call from Nephrologist at [facility]- pt being brought in today with family who do not wish to drive to [facility] with reports that pt last had HD on Friday, went yesterday and Permacath not working properly, was unable to get full treatment. Being sent for new placement of dialysis catheter.

## 2024-03-30 ENCOUNTER — Encounter: Admission: EM | Disposition: A | Payer: Self-pay | Source: Home / Self Care | Attending: Emergency Medicine

## 2024-03-30 ENCOUNTER — Encounter: Payer: Self-pay | Admitting: Internal Medicine

## 2024-03-30 LAB — CBC
HCT: 36 % — ABNORMAL LOW (ref 39.0–52.0)
Hemoglobin: 11.1 g/dL — ABNORMAL LOW (ref 13.0–17.0)
MCH: 28.5 pg (ref 26.0–34.0)
MCHC: 30.8 g/dL (ref 30.0–36.0)
MCV: 92.3 fL (ref 80.0–100.0)
Platelets: 197 10*3/uL (ref 150–400)
RBC: 3.9 MIL/uL — ABNORMAL LOW (ref 4.22–5.81)
RDW: 18.7 % — ABNORMAL HIGH (ref 11.5–15.5)
WBC: 6.4 10*3/uL (ref 4.0–10.5)
nRBC: 0 % (ref 0.0–0.2)

## 2024-03-30 LAB — BASIC METABOLIC PANEL WITH GFR
Anion gap: 14 (ref 5–15)
BUN: 39 mg/dL — ABNORMAL HIGH (ref 6–20)
CO2: 23 mmol/L (ref 22–32)
Calcium: 8.6 mg/dL — ABNORMAL LOW (ref 8.9–10.3)
Chloride: 103 mmol/L (ref 98–111)
Creatinine, Ser: 4.37 mg/dL — ABNORMAL HIGH (ref 0.61–1.24)
GFR, Estimated: 15 mL/min — ABNORMAL LOW
Glucose, Bld: 138 mg/dL — ABNORMAL HIGH (ref 70–99)
Potassium: 4.6 mmol/L (ref 3.5–5.1)
Sodium: 140 mmol/L (ref 135–145)

## 2024-03-30 LAB — CBG MONITORING, ED
Glucose-Capillary: 120 mg/dL — ABNORMAL HIGH (ref 70–99)
Glucose-Capillary: 132 mg/dL — ABNORMAL HIGH (ref 70–99)

## 2024-03-30 MED ORDER — HEPARIN SODIUM (PORCINE) 10000 UNIT/ML IJ SOLN
INTRAMUSCULAR | Status: DC | PRN
  Administered 2024-03-30: 10000 [IU]

## 2024-03-30 MED ORDER — FENTANYL CITRATE (PF) 100 MCG/2ML IJ SOLN
INTRAMUSCULAR | Status: AC
  Filled 2024-03-30: qty 2

## 2024-03-30 MED ORDER — MIDAZOLAM HCL 2 MG/2ML IJ SOLN
INTRAMUSCULAR | Status: AC
  Filled 2024-03-30: qty 4

## 2024-03-30 MED ORDER — MIDAZOLAM HCL 2 MG/ML PO SYRP
8.0000 mg | ORAL_SOLUTION | Freq: Once | ORAL | Status: DC | PRN
  Filled 2024-03-30: qty 5

## 2024-03-30 MED ORDER — HYDROMORPHONE HCL 1 MG/ML IJ SOLN: 1.0000 mg | Freq: Once | INTRAMUSCULAR | Status: DC | PRN

## 2024-03-30 MED ORDER — LIDOCAINE-EPINEPHRINE (PF) 1 %-1:200000 IJ SOLN
INTRAMUSCULAR | Status: DC | PRN
  Administered 2024-03-30: 20 mL via INTRADERMAL

## 2024-03-30 MED ORDER — HEPARIN SODIUM (PORCINE) 10000 UNIT/ML IJ SOLN
INTRAMUSCULAR | Status: AC
  Filled 2024-03-30: qty 1

## 2024-03-30 MED ORDER — FENTANYL CITRATE (PF) 100 MCG/2ML IJ SOLN
INTRAMUSCULAR | Status: DC | PRN
  Administered 2024-03-30: 50 ug via INTRAVENOUS

## 2024-03-30 MED ORDER — FAMOTIDINE 20 MG PO TABS: 40.0000 mg | ORAL_TABLET | Freq: Once | ORAL | Status: DC | PRN

## 2024-03-30 MED ORDER — MIDAZOLAM HCL (PF) 2 MG/2ML IJ SOLN
INTRAMUSCULAR | Status: DC | PRN
  Administered 2024-03-30: 2 mg via INTRAVENOUS

## 2024-03-30 MED ORDER — SODIUM CHLORIDE 0.9 % IV SOLN: INTRAVENOUS | Status: DC

## 2024-03-30 MED ORDER — CEFAZOLIN SODIUM-DEXTROSE 1-4 GM/50ML-% IV SOLN
1.0000 g | INTRAVENOUS | Status: DC
  Administered 2024-03-30: 1 g via INTRAVENOUS
  Filled 2024-03-30: qty 50

## 2024-03-30 MED ORDER — METHOCARBAMOL 500 MG PO TABS
500.0000 mg | ORAL_TABLET | Freq: Once | ORAL | Status: AC
  Administered 2024-03-30: 500 mg via ORAL
  Filled 2024-03-30: qty 1

## 2024-03-30 MED ORDER — DIPHENHYDRAMINE HCL 50 MG/ML IJ SOLN: 50.0000 mg | Freq: Once | INTRAMUSCULAR | Status: DC | PRN

## 2024-03-30 MED ORDER — HEPARIN (PORCINE) IN NACL 1000-0.9 UT/500ML-% IV SOLN
INTRAVENOUS | Status: DC | PRN
  Administered 2024-03-30: 500 mL

## 2024-03-30 MED ORDER — METHYLPREDNISOLONE SODIUM SUCC 125 MG IJ SOLR: 125.0000 mg | Freq: Once | INTRAMUSCULAR | Status: DC | PRN

## 2024-03-30 MED ORDER — LIDOCAINE 5 % EX PTCH
1.0000 | MEDICATED_PATCH | CUTANEOUS | Status: DC
  Administered 2024-03-30: 1 via TRANSDERMAL
  Filled 2024-03-30: qty 1

## 2024-03-30 MED ORDER — CEFAZOLIN SODIUM-DEXTROSE 1-4 GM/50ML-% IV SOLN
INTRAVENOUS | Status: AC
  Filled 2024-03-30: qty 50

## 2024-03-30 NOTE — Op Note (Signed)
 OPERATIVE NOTE    PRE-OPERATIVE DIAGNOSIS: 1. ESRD 2. Non-functional permcath  POST-OPERATIVE DIAGNOSIS: same as above  PROCEDURE: Fluoroscopic guidance for placement of catheter Placement of a 19 cm tip to cuff tunneled hemodialysis catheter via the right internal jugular vein and removal of previous catheter  SURGEON: Selinda Gu, MD  ANESTHESIA:  Local with moderate conscious sedation for 9 minutes using 2 mg of Versed  and 50 mcg of Fentanyl   ESTIMATED BLOOD LOSS: 3 cc  FINDING(S): none  SPECIMEN(S):  None  INDICATIONS:   Patient is a 59 y.o.male who presents with non-functional dialysis catheter and ESRD.  The patient needs long term dialysis access for their ESRD, and a Permcath is necessary.  Risks and benefits are discussed and informed consent is obtained.    DESCRIPTION: After obtaining full informed written consent, the patient was brought back to the vascular suite. The patient received moderate conscious sedation during a face-to-face encounter with me present throughout the entire procedure and supervising the RN monitoring the vital signs, pulse oximetry, telemetry, and mental status throughout the entire procedure. The patient's existing catheter, right neck and chest were sterilely prepped and draped in a sterile surgical field was created.  The existing catheter was dissected free from the fibrous sheath securing the cuff with hemostats and blunt dissection.  A wire was placed. The existing catheter was then removed and the wire used to keep venous access. I selected a 19 cm tip to cuff tunneled dialysis catheter.  Using fluoroscopic guidance the catheter tips were parked in the right atrium. The appropriate distal connectors were placed. It withdrew blood well and flushed easily with heparinized saline and a concentrated heparin  solution was then placed. It was secured to the chest wall with 2 Prolene sutures. A 4-0 Monocryl pursestring suture was placed around the exit  site. Sterile dressings were placed. The patient tolerated the procedure well and was taken to the recovery room in stable condition.  COMPLICATIONS: None  CONDITION: Stable  Selinda Gu 03/30/2024 9:48 AM   This note was created with Dragon Medical transcription system. Any errors in dictation are purely unintentional.

## 2024-03-30 NOTE — Progress Notes (Signed)
 Pt receives outpt HD at [facility] 6:15am. Navigator following to assist with any HD needs.  Suzen Satchel Dialysis Navigator [phone]

## 2024-03-30 NOTE — Discharge Summary (Signed)
 " Physician Discharge Summary   Patient: Hector [name] MRN: [id number] DOB: 13-Oct-1965  Admit date:     03/29/2024  Discharge date: 03/30/24  Discharge Physician: [name]   PCP: [name], MD   Recommendations at discharge:    Pt to be discharged home.   If you experience worsening fever, chills, chest pain, shortness of breath, or other concerning symptoms, please call your PCP or go to the emergency department immediately.  Discharge Diagnoses: Principal Problem:   Encounter for dialysis catheter care Active Problems:   ESRD on hemodialysis (HCC)   Paroxysmal atrial fibrillation (HCC)   Chronic systolic CHF (congestive heart failure) (HCC)   Type II diabetes mellitus with renal manifestations (HCC)   COPD (chronic obstructive pulmonary disease) (HCC)   Essential hypertension   Stroke (HCC)   HLD (hyperlipidemia)   Obesity (BMI 30-39.9)  Resolved Problems:   * No resolved hospital problems. *   Hospital Course:  59 y.o. male with medical history significant of ESRD-HD (TTS), A. fib on Eliquis , pancreatitis, sCHF with EF < 20%, HTN, HLD, stroke, hypothyroidism, alcohol abuse in remission, GI bleeding, OSA, anemia,  who presents with dialysis catheter problem.   Pt has Permacath catheter over right upper chest. Pt had partial dialysis on Tuesday (1/3) since the dialysis catheter is no longer working properly. There was an air leak in the catheter. He states that he was able to complete about 30 min of HD on Tuesday. Patient was sent over by the nephrologist at [facility] for replacement of his catheter.  Patient does not have SOB, chest pain, cough.  No nausea, vomiting, diarrhea or abdominal pain.  No symptoms of UTI.  No fever or chills.  He took last dose of Eliquis  in the morning.   Of note, pt had dialysis at [facility] on 1/30. Did not have dialysis over the weekend due to snowstorm. Attempted regular scheduled dialysis yesterday, b    Data reviewed independently  and ED Course: pt was found to have WBC 5.9, potassium 4.6, bicarbonate 28, creatinine 4.45, BUN 35.  Temperature normal, blood pressure 115/85, heart rate 91, RR 16, oxygen saturation 100% room air.  Patient is placed in telemetry bed for observation.  Consulted Dr. Marea of VS, Dr. Douglas of nephrology. Assessment and Plan:  Encounter for dialysis catheter care - Vascular surgery consulted for replacement of tunneled dialysis catheter.  s/p replacement 2/5 with no complications.  ESRD-HD TTS - Patient scheduled for outpatient HD later today.  Tunneled dialysis catheter replacement successful.  Patient okay to be discharged at this time and resume outpatient dialysis per regular schedule.  Chronic HFrEF - Does not appear to be in acute exacerbation.  Diabetes mellitus - Resume home medication regimen  COPD/HTN/HLD - Resume home regimen  Consultants: Nephrology, vascular surgery Procedures performed: Placement of 19 cm tip to cuff tunneled hemodialysis catheter via right internal jugular vein and removal of previous catheter Disposition: Home Diet recommendation:  Renal diet  DISCHARGE MEDICATION: Allergies as of 03/30/2024       Reactions   Esomeprazole Magnesium  Other (See Comments)   Suspected interstitial nephritis 2018   Egg Protein-containing Drug Products Rash        Medication List     TAKE these medications    (feeding supplement) PROSource Plus liquid Take 30 mLs by mouth 4 (four) times daily - after meals and at bedtime.   acetaminophen  325 MG tablet Commonly known as: TYLENOL  Take 2 tablets (650 mg total)  by mouth every 6 (six) hours as needed for mild pain (pain score 1-3) or fever (or Fever >/= 101).   allopurinol  100 MG tablet Commonly known as: ZYLOPRIM  Take 100 mg by mouth daily.   amiodarone  200 MG tablet Commonly known as: PACERONE  Take 1 tablet (200 mg total) by mouth daily.   apixaban  5 MG Tabs tablet Commonly known as: ELIQUIS  Take 1 tablet  (5 mg total) by mouth 2 (two) times daily.   atorvastatin  80 MG tablet Commonly known as: LIPITOR  Take 1 tablet (80 mg total) by mouth daily.   calcitRIOL  0.25 MCG capsule Commonly known as: ROCALTROL  Take 0.25 mcg by mouth daily.   heparin  sodium (porcine) 1000 UNIT/ML injection Inject 1,000 Units into the vein. Bolus, Every Treatment, Total treatment minutes 240   Jardiance  10 MG Tabs tablet Generic drug: empagliflozin  Take 10 mg by mouth daily.   levothyroxine  125 MCG tablet Commonly known as: SYNTHROID  Take 125 mcg by mouth daily before breakfast.   lidocaine  5 % Commonly known as: Lidoderm  Place 1 patch onto the skin every 12 (twelve) hours. Remove & Discard patch within 12 hours or as directed by MD   lipase/protease/amylase 63999 UNITS Cpep capsule Commonly known as: CREON  Take 1 capsule (36,000 Units total) by mouth 3 (three) times daily before meals.   loratadine 10 MG tablet Commonly known as: CLARITIN Take 10 mg by mouth at bedtime.   methocarbamol  500 MG tablet Commonly known as: ROBAXIN  Take 1 tablet (500 mg total) by mouth every 8 (eight) hours as needed.   midodrine  10 MG tablet Commonly known as: PROAMATINE  Take 1 tablet (10 mg total) by mouth Every Tuesday,Thursday,and Saturday with dialysis.   multivitamin Tabs tablet Take 1 tablet by mouth at bedtime.   ondansetron  4 MG tablet Commonly known as: ZOFRAN  Take 1 tablet (4 mg total) by mouth every 6 (six) hours as needed for nausea.   oxymetazoline 0.05 % nasal spray Commonly known as: AFRIN Place 1 spray into both nostrils 2 (two) times daily as needed for congestion (nosebleed).   polyethylene glycol 17 g packet Commonly known as: MIRALAX  / GLYCOLAX  Take 17 g by mouth daily as needed for mild constipation.   torsemide  10 MG tablet Commonly known as: DEMADEX  Take 5 tablets (50 mg total) by mouth every Monday, Wednesday, and Friday. What changed:  how much to take when to take  this additional instructions   traZODone  50 MG tablet Commonly known as: DESYREL  Take 0.5 tablets (25 mg total) by mouth at bedtime as needed for sleep.         Discharge Exam: Filed Weights   03/29/24 1433  Weight: 117.9 kg    GENERAL:  Alert, pleasant, no acute distress  HEENT:  EOMI CARDIOVASCULAR:  RRR, no murmurs appreciated RESPIRATORY:  Clear to auscultation, no wheezing, rales, or rhonchi GASTROINTESTINAL:  Soft, nontender, nondistended EXTREMITIES:  No LE edema bilaterally NEURO:  No new focal deficits appreciated SKIN:  No rashes noted PSYCH:  Appropriate mood and affect     Condition at discharge: improving  The results of significant diagnostics from this hospitalization (including imaging, microbiology, ancillary and laboratory) are listed below for reference.   Imaging Studies: PERIPHERAL VASCULAR CATHETERIZATION Result Date: 03/30/2024 See surgical note for result.   Microbiology: Results for orders placed or performed during the hospital encounter of 12/23/23  Urine Culture (for pregnant, neutropenic or urologic patients or patients with an indwelling urinary catheter)     Status: Abnormal   Collection  Time: 12/23/23  7:40 PM   Specimen: Urine, Catheterized  Result Value Ref Range Status   Specimen Description   Final    URINE, CATHETERIZED Performed at [facility], [address], [place], [place] [address]    Special Requests   Final    NONE Performed at [facility], [address] [place], [place] [address]    Culture (A)  Final    <10,000 COLONIES/mL INSIGNIFICANT GROWTH Performed at [facility], [address], [place], [place] [address]    Report Status 12/25/2023 FINAL  Final    Labs: CBC: Recent Labs  Lab 03/29/24 1440 03/30/24 0519  WBC 5.9 6.4  NEUTROABS 3.7  --   HGB 11.3* 11.1*  HCT 36.5* 36.0*  MCV 92.6 92.3  PLT 171 197   Basic Metabolic Panel: Recent Labs  Lab 03/29/24 1440  03/30/24 0519  NA 139 140  K 4.6 4.6  CL 98 103  CO2 28 23  GLUCOSE 146* 138*  BUN 35* 39*  CREATININE 4.45* 4.37*  CALCIUM  9.0 8.6*   Liver Function Tests: Recent Labs  Lab 03/29/24 1440  AST 25  ALT 9  ALKPHOS 103  BILITOT 0.9  PROT 7.3  ALBUMIN  4.1   CBG: Recent Labs  Lab 03/29/24 2301 03/30/24 0744 03/30/24 0917  GLUCAP 114* 120* 132*    Discharge time spent: 25 minutes.  Length of inpatient stay: 0 days  Signed: [name], DO [facility] 03/30/2024         "
